# Patient Record
Sex: Female | Born: 1950 | Race: White | Hispanic: No | Marital: Single | Smoking: Former smoker
Health system: Southern US, Community
[De-identification: ages and names within clinical notes are randomized; demographics above are authoritative.]

## PROBLEM LIST (undated history)

## (undated) DIAGNOSIS — F209 Schizophrenia, unspecified: Secondary | ICD-10-CM

## (undated) DIAGNOSIS — E440 Moderate protein-calorie malnutrition: Secondary | ICD-10-CM

## (undated) DIAGNOSIS — I4892 Unspecified atrial flutter: Secondary | ICD-10-CM

## (undated) DIAGNOSIS — G4733 Obstructive sleep apnea (adult) (pediatric): Secondary | ICD-10-CM

## (undated) DIAGNOSIS — E662 Morbid (severe) obesity with alveolar hypoventilation: Secondary | ICD-10-CM

## (undated) DIAGNOSIS — G629 Polyneuropathy, unspecified: Secondary | ICD-10-CM

## (undated) DIAGNOSIS — E119 Type 2 diabetes mellitus without complications: Secondary | ICD-10-CM

## (undated) DIAGNOSIS — R413 Other amnesia: Secondary | ICD-10-CM

## (undated) DIAGNOSIS — N390 Urinary tract infection, site not specified: Secondary | ICD-10-CM

## (undated) DIAGNOSIS — M79602 Pain in left arm: Secondary | ICD-10-CM

## (undated) DIAGNOSIS — F32A Depression, unspecified: Secondary | ICD-10-CM

## (undated) DIAGNOSIS — I272 Pulmonary hypertension, unspecified: Secondary | ICD-10-CM

## (undated) DIAGNOSIS — R569 Unspecified convulsions: Secondary | ICD-10-CM

## (undated) DIAGNOSIS — I10 Essential (primary) hypertension: Secondary | ICD-10-CM

## (undated) DIAGNOSIS — F79 Unspecified intellectual disabilities: Secondary | ICD-10-CM

## (undated) DIAGNOSIS — E7800 Pure hypercholesterolemia, unspecified: Secondary | ICD-10-CM

## (undated) DIAGNOSIS — G219 Secondary parkinsonism, unspecified: Secondary | ICD-10-CM

## (undated) DIAGNOSIS — E039 Hypothyroidism, unspecified: Secondary | ICD-10-CM

## (undated) DIAGNOSIS — J189 Pneumonia, unspecified organism: Secondary | ICD-10-CM

## (undated) DIAGNOSIS — N183 Chronic kidney disease, stage 3 (moderate): Secondary | ICD-10-CM

## (undated) DIAGNOSIS — F1920 Other psychoactive substance dependence, uncomplicated: Secondary | ICD-10-CM

## (undated) DIAGNOSIS — G40909 Epilepsy, unspecified, not intractable, without status epilepticus: Secondary | ICD-10-CM

## (undated) DIAGNOSIS — E785 Hyperlipidemia, unspecified: Secondary | ICD-10-CM

## (undated) DIAGNOSIS — M069 Rheumatoid arthritis, unspecified: Secondary | ICD-10-CM

## (undated) DIAGNOSIS — F29 Unspecified psychosis not due to a substance or known physiological condition: Secondary | ICD-10-CM

## (undated) DIAGNOSIS — Z794 Long term (current) use of insulin: Secondary | ICD-10-CM

## (undated) DIAGNOSIS — J449 Chronic obstructive pulmonary disease, unspecified: Secondary | ICD-10-CM

## (undated) DIAGNOSIS — I452 Bifascicular block: Secondary | ICD-10-CM

## (undated) DIAGNOSIS — K2270 Barrett's esophagus without dysplasia: Secondary | ICD-10-CM

## (undated) DIAGNOSIS — Z8679 Personal history of other diseases of the circulatory system: Secondary | ICD-10-CM

## (undated) DIAGNOSIS — K5900 Constipation, unspecified: Secondary | ICD-10-CM

## (undated) DIAGNOSIS — Z992 Dependence on renal dialysis: Secondary | ICD-10-CM

## (undated) DIAGNOSIS — F419 Anxiety disorder, unspecified: Secondary | ICD-10-CM

## (undated) DIAGNOSIS — F4310 Post-traumatic stress disorder, unspecified: Secondary | ICD-10-CM

## (undated) DIAGNOSIS — F329 Major depressive disorder, single episode, unspecified: Secondary | ICD-10-CM

## (undated) DIAGNOSIS — R1310 Dysphagia, unspecified: Secondary | ICD-10-CM

## (undated) DIAGNOSIS — R000 Tachycardia, unspecified: Secondary | ICD-10-CM

## (undated) DIAGNOSIS — Z20822 Contact with and (suspected) exposure to covid-19: Secondary | ICD-10-CM

## (undated) DIAGNOSIS — I2699 Other pulmonary embolism without acute cor pulmonale: Secondary | ICD-10-CM

## (undated) DIAGNOSIS — I5030 Unspecified diastolic (congestive) heart failure: Secondary | ICD-10-CM

## (undated) DIAGNOSIS — Z6839 Body mass index (BMI) 39.0-39.9, adult: Secondary | ICD-10-CM

## (undated) DIAGNOSIS — N200 Calculus of kidney: Secondary | ICD-10-CM

## (undated) DIAGNOSIS — R300 Dysuria: Secondary | ICD-10-CM

## (undated) DIAGNOSIS — M549 Dorsalgia, unspecified: Secondary | ICD-10-CM

## (undated) DIAGNOSIS — G4700 Insomnia, unspecified: Secondary | ICD-10-CM

## (undated) DIAGNOSIS — I209 Angina pectoris, unspecified: Secondary | ICD-10-CM

## (undated) DIAGNOSIS — M0520 Rheumatoid vasculitis with rheumatoid arthritis of unspecified site: Secondary | ICD-10-CM

## (undated) DIAGNOSIS — M797 Fibromyalgia: Secondary | ICD-10-CM

## (undated) DIAGNOSIS — L0390 Cellulitis, unspecified: Secondary | ICD-10-CM

## (undated) DIAGNOSIS — I5032 Chronic diastolic (congestive) heart failure: Secondary | ICD-10-CM

## (undated) DIAGNOSIS — J338 Other polyp of sinus: Secondary | ICD-10-CM

## (undated) DIAGNOSIS — I509 Heart failure, unspecified: Secondary | ICD-10-CM

## (undated) DIAGNOSIS — K219 Gastro-esophageal reflux disease without esophagitis: Secondary | ICD-10-CM

## (undated) DIAGNOSIS — I4819 Other persistent atrial fibrillation: Secondary | ICD-10-CM

## (undated) DIAGNOSIS — D649 Anemia, unspecified: Secondary | ICD-10-CM

## (undated) DIAGNOSIS — IMO0002 Reserved for concepts with insufficient information to code with codable children: Secondary | ICD-10-CM

## (undated) DIAGNOSIS — N186 End stage renal disease: Secondary | ICD-10-CM

## (undated) HISTORY — DX: Chronic diastolic (congestive) heart failure: I50.32

## (undated) HISTORY — DX: Heart failure, unspecified: I50.9

## (undated) HISTORY — DX: Barrett's esophagus without dysplasia: K22.70

## (undated) HISTORY — DX: Hyperlipidemia, unspecified: E78.5

## (undated) HISTORY — PX: CHOLECYSTECTOMY: SHX55

## (undated) HISTORY — DX: Major depressive disorder, single episode, unspecified: F32.9

## (undated) HISTORY — DX: Secondary parkinsonism, unspecified: G21.9

## (undated) HISTORY — DX: Personal history of other diseases of the circulatory system: Z86.79

## (undated) HISTORY — DX: Polyneuropathy, unspecified: G62.9

## (undated) HISTORY — DX: Reserved for concepts with insufficient information to code with codable children: IMO0002

## (undated) HISTORY — DX: Calculus of kidney: N20.0

## (undated) HISTORY — DX: Essential (primary) hypertension: I10

## (undated) HISTORY — DX: Dependence on renal dialysis: N18.6

## (undated) HISTORY — DX: Gastro-esophageal reflux disease without esophagitis: K21.9

## (undated) HISTORY — DX: Type 2 diabetes mellitus without complications: E11.9

## (undated) HISTORY — DX: Unspecified diastolic (congestive) heart failure: I50.30

## (undated) HISTORY — DX: Morbid (severe) obesity with alveolar hypoventilation: E66.2

## (undated) HISTORY — DX: Other pulmonary embolism without acute cor pulmonale: I26.99

## (undated) HISTORY — DX: Schizophrenia, unspecified: F20.9

## (undated) HISTORY — DX: Anemia, unspecified: D64.9

## (undated) HISTORY — DX: Depression, unspecified: F32.A

## (undated) HISTORY — DX: Chronic kidney disease, stage 3 (moderate): N18.3

## (undated) HISTORY — PX: KIDNEY SURGERY: SHX687

## (undated) HISTORY — DX: Other psychoactive substance dependence, uncomplicated: F19.20

## (undated) HISTORY — DX: Other persistent atrial fibrillation: I48.19

## (undated) HISTORY — DX: Rheumatoid arthritis, unspecified: M06.9

## (undated) HISTORY — PX: ENDOMETRIAL BIOPSY: SHX622

## (undated) HISTORY — DX: Bifascicular block: I45.2

## (undated) HISTORY — DX: Pulmonary hypertension, unspecified: I27.20

## (undated) HISTORY — PX: APPENDECTOMY: SHX54

## (undated) HISTORY — DX: Fibromyalgia: M79.7

## (undated) HISTORY — DX: Dorsalgia, unspecified: M54.9

## (undated) HISTORY — DX: Unspecified atrial flutter: I48.92

## (undated) HISTORY — PX: COLONOSCOPY: SHX174

## (undated) HISTORY — DX: End stage renal disease: Z99.2

## (undated) HISTORY — DX: Anxiety disorder, unspecified: F41.9

---

## 2001-08-23 ENCOUNTER — Encounter: Admission: RE | Admit: 2001-08-23 | Discharge: 2001-08-23 | Payer: Self-pay | Admitting: Family Medicine

## 2001-09-25 ENCOUNTER — Encounter: Admission: RE | Admit: 2001-09-25 | Discharge: 2001-09-25 | Payer: Self-pay | Admitting: Family Medicine

## 2001-10-03 ENCOUNTER — Encounter: Admission: RE | Admit: 2001-10-03 | Discharge: 2001-10-03 | Payer: Self-pay | Admitting: Family Medicine

## 2001-10-30 ENCOUNTER — Encounter: Admission: RE | Admit: 2001-10-30 | Discharge: 2001-10-30 | Payer: Self-pay | Admitting: Family Medicine

## 2001-11-03 ENCOUNTER — Encounter: Admission: RE | Admit: 2001-11-03 | Discharge: 2001-11-03 | Payer: Self-pay | Admitting: Family Medicine

## 2001-11-13 ENCOUNTER — Encounter: Admission: RE | Admit: 2001-11-13 | Discharge: 2001-11-13 | Payer: Self-pay | Admitting: Family Medicine

## 2001-11-27 ENCOUNTER — Observation Stay (HOSPITAL_COMMUNITY): Admission: EM | Admit: 2001-11-27 | Discharge: 2001-11-28 | Payer: Self-pay

## 2001-11-27 ENCOUNTER — Encounter: Payer: Self-pay | Admitting: Emergency Medicine

## 2001-12-04 ENCOUNTER — Encounter: Admission: RE | Admit: 2001-12-04 | Discharge: 2001-12-04 | Payer: Self-pay | Admitting: Family Medicine

## 2001-12-07 ENCOUNTER — Encounter: Admission: RE | Admit: 2001-12-07 | Discharge: 2001-12-07 | Payer: Self-pay | Admitting: Sports Medicine

## 2001-12-21 ENCOUNTER — Encounter (INDEPENDENT_AMBULATORY_CARE_PROVIDER_SITE_OTHER): Payer: Self-pay | Admitting: Specialist

## 2001-12-21 ENCOUNTER — Ambulatory Visit (HOSPITAL_COMMUNITY): Admission: RE | Admit: 2001-12-21 | Discharge: 2001-12-21 | Payer: Self-pay | Admitting: *Deleted

## 2002-01-08 ENCOUNTER — Encounter: Admission: RE | Admit: 2002-01-08 | Discharge: 2002-01-08 | Payer: Self-pay | Admitting: Sports Medicine

## 2002-04-04 ENCOUNTER — Emergency Department (HOSPITAL_COMMUNITY): Admission: EM | Admit: 2002-04-04 | Discharge: 2002-04-04 | Payer: Self-pay | Admitting: Emergency Medicine

## 2002-04-04 ENCOUNTER — Encounter: Payer: Self-pay | Admitting: Emergency Medicine

## 2002-04-19 ENCOUNTER — Encounter: Payer: Self-pay | Admitting: Internal Medicine

## 2002-04-19 ENCOUNTER — Encounter: Admission: RE | Admit: 2002-04-19 | Discharge: 2002-04-19 | Payer: Self-pay | Admitting: Family Medicine

## 2002-04-19 ENCOUNTER — Encounter: Admission: RE | Admit: 2002-04-19 | Discharge: 2002-04-19 | Payer: Self-pay | Admitting: Internal Medicine

## 2002-04-20 ENCOUNTER — Encounter: Payer: Self-pay | Admitting: Sports Medicine

## 2002-04-20 ENCOUNTER — Encounter: Admission: RE | Admit: 2002-04-20 | Discharge: 2002-04-20 | Payer: Self-pay | Admitting: Sports Medicine

## 2002-05-21 ENCOUNTER — Encounter: Admission: RE | Admit: 2002-05-21 | Discharge: 2002-05-21 | Payer: Self-pay | Admitting: Sports Medicine

## 2002-05-31 ENCOUNTER — Encounter: Payer: Self-pay | Admitting: Sports Medicine

## 2002-05-31 ENCOUNTER — Encounter: Admission: RE | Admit: 2002-05-31 | Discharge: 2002-05-31 | Payer: Self-pay | Admitting: Sports Medicine

## 2002-06-11 ENCOUNTER — Encounter: Admission: RE | Admit: 2002-06-11 | Discharge: 2002-06-11 | Payer: Self-pay | Admitting: Family Medicine

## 2002-06-11 ENCOUNTER — Other Ambulatory Visit: Admission: RE | Admit: 2002-06-11 | Discharge: 2002-06-11 | Payer: Self-pay | Admitting: Family Medicine

## 2002-11-06 ENCOUNTER — Encounter: Admission: RE | Admit: 2002-11-06 | Discharge: 2002-11-06 | Payer: Self-pay | Admitting: Family Medicine

## 2002-12-04 ENCOUNTER — Ambulatory Visit (HOSPITAL_COMMUNITY): Admission: RE | Admit: 2002-12-04 | Discharge: 2002-12-04 | Payer: Self-pay | Admitting: *Deleted

## 2002-12-14 ENCOUNTER — Encounter: Admission: RE | Admit: 2002-12-14 | Discharge: 2002-12-14 | Payer: Self-pay | Admitting: Family Medicine

## 2002-12-18 ENCOUNTER — Encounter: Admission: RE | Admit: 2002-12-18 | Discharge: 2002-12-18 | Payer: Self-pay | Admitting: Surgery

## 2002-12-18 ENCOUNTER — Encounter: Payer: Self-pay | Admitting: Surgery

## 2003-01-11 ENCOUNTER — Encounter: Admission: RE | Admit: 2003-01-11 | Discharge: 2003-01-11 | Payer: Self-pay | Admitting: Family Medicine

## 2003-02-26 ENCOUNTER — Encounter: Admission: RE | Admit: 2003-02-26 | Discharge: 2003-02-26 | Payer: Self-pay | Admitting: Family Medicine

## 2003-05-02 ENCOUNTER — Encounter: Admission: RE | Admit: 2003-05-02 | Discharge: 2003-05-02 | Payer: Self-pay | Admitting: Family Medicine

## 2003-05-16 ENCOUNTER — Encounter: Admission: RE | Admit: 2003-05-16 | Discharge: 2003-05-16 | Payer: Self-pay | Admitting: Sports Medicine

## 2003-05-23 ENCOUNTER — Encounter: Admission: RE | Admit: 2003-05-23 | Discharge: 2003-05-23 | Payer: Self-pay | Admitting: Family Medicine

## 2003-06-03 ENCOUNTER — Encounter: Admission: RE | Admit: 2003-06-03 | Discharge: 2003-06-03 | Payer: Self-pay | Admitting: Sports Medicine

## 2003-06-03 ENCOUNTER — Encounter: Payer: Self-pay | Admitting: Sports Medicine

## 2003-06-03 ENCOUNTER — Encounter: Admission: RE | Admit: 2003-06-03 | Discharge: 2003-06-03 | Payer: Self-pay | Admitting: Family Medicine

## 2003-06-05 ENCOUNTER — Encounter: Admission: RE | Admit: 2003-06-05 | Discharge: 2003-06-05 | Payer: Self-pay | Admitting: Family Medicine

## 2003-06-06 ENCOUNTER — Encounter: Admission: RE | Admit: 2003-06-06 | Discharge: 2003-06-06 | Payer: Self-pay | Admitting: Sports Medicine

## 2003-06-07 ENCOUNTER — Encounter: Admission: RE | Admit: 2003-06-07 | Discharge: 2003-06-07 | Payer: Self-pay | Admitting: Sports Medicine

## 2003-06-10 ENCOUNTER — Encounter: Admission: RE | Admit: 2003-06-10 | Discharge: 2003-06-10 | Payer: Self-pay | Admitting: Family Medicine

## 2003-06-17 ENCOUNTER — Encounter: Admission: RE | Admit: 2003-06-17 | Discharge: 2003-06-17 | Payer: Self-pay | Admitting: Family Medicine

## 2003-06-17 ENCOUNTER — Other Ambulatory Visit: Admission: RE | Admit: 2003-06-17 | Discharge: 2003-06-17 | Payer: Self-pay | Admitting: Family Medicine

## 2003-06-26 ENCOUNTER — Encounter: Admission: RE | Admit: 2003-06-26 | Discharge: 2003-06-26 | Payer: Self-pay | Admitting: Sports Medicine

## 2003-06-27 ENCOUNTER — Encounter: Admission: RE | Admit: 2003-06-27 | Discharge: 2003-06-27 | Payer: Self-pay | Admitting: Sports Medicine

## 2003-07-01 ENCOUNTER — Encounter: Admission: RE | Admit: 2003-07-01 | Discharge: 2003-07-01 | Payer: Self-pay | Admitting: Family Medicine

## 2003-07-08 ENCOUNTER — Encounter: Admission: RE | Admit: 2003-07-08 | Discharge: 2003-07-08 | Payer: Self-pay | Admitting: Family Medicine

## 2003-07-31 ENCOUNTER — Encounter: Admission: RE | Admit: 2003-07-31 | Discharge: 2003-07-31 | Payer: Self-pay | Admitting: Sports Medicine

## 2003-08-12 ENCOUNTER — Encounter: Admission: RE | Admit: 2003-08-12 | Discharge: 2003-08-12 | Payer: Self-pay | Admitting: Family Medicine

## 2003-08-19 ENCOUNTER — Encounter: Admission: RE | Admit: 2003-08-19 | Discharge: 2003-08-19 | Payer: Self-pay | Admitting: Family Medicine

## 2003-08-28 ENCOUNTER — Encounter: Admission: RE | Admit: 2003-08-28 | Discharge: 2003-08-28 | Payer: Self-pay | Admitting: Family Medicine

## 2003-09-05 ENCOUNTER — Encounter: Admission: RE | Admit: 2003-09-05 | Discharge: 2003-09-05 | Payer: Self-pay | Admitting: Family Medicine

## 2003-09-13 ENCOUNTER — Encounter: Admission: RE | Admit: 2003-09-13 | Discharge: 2003-09-13 | Payer: Self-pay | Admitting: Sports Medicine

## 2003-09-30 ENCOUNTER — Encounter: Admission: RE | Admit: 2003-09-30 | Discharge: 2003-09-30 | Payer: Self-pay | Admitting: Family Medicine

## 2003-10-16 ENCOUNTER — Encounter: Admission: RE | Admit: 2003-10-16 | Discharge: 2003-10-16 | Payer: Self-pay | Admitting: Family Medicine

## 2003-10-29 ENCOUNTER — Encounter: Admission: RE | Admit: 2003-10-29 | Discharge: 2003-10-29 | Payer: Self-pay | Admitting: Family Medicine

## 2003-11-21 ENCOUNTER — Encounter: Admission: RE | Admit: 2003-11-21 | Discharge: 2003-11-21 | Payer: Self-pay | Admitting: Family Medicine

## 2003-12-25 ENCOUNTER — Encounter: Payer: Self-pay | Admitting: Cardiology

## 2003-12-25 ENCOUNTER — Ambulatory Visit (HOSPITAL_COMMUNITY): Admission: RE | Admit: 2003-12-25 | Discharge: 2003-12-25 | Payer: Self-pay | Admitting: Vascular Surgery

## 2004-01-24 ENCOUNTER — Encounter: Admission: RE | Admit: 2004-01-24 | Discharge: 2004-01-24 | Payer: Self-pay | Admitting: Sports Medicine

## 2004-02-27 ENCOUNTER — Emergency Department (HOSPITAL_COMMUNITY): Admission: EM | Admit: 2004-02-27 | Discharge: 2004-02-27 | Payer: Self-pay | Admitting: *Deleted

## 2004-04-13 ENCOUNTER — Encounter: Admission: RE | Admit: 2004-04-13 | Discharge: 2004-04-13 | Payer: Self-pay | Admitting: Sports Medicine

## 2004-05-13 ENCOUNTER — Ambulatory Visit: Payer: Self-pay | Admitting: Family Medicine

## 2004-06-09 ENCOUNTER — Ambulatory Visit: Payer: Self-pay | Admitting: Sports Medicine

## 2004-07-06 ENCOUNTER — Ambulatory Visit: Payer: Self-pay | Admitting: Family Medicine

## 2004-07-20 ENCOUNTER — Ambulatory Visit: Payer: Self-pay | Admitting: Sports Medicine

## 2004-07-29 ENCOUNTER — Ambulatory Visit: Payer: Self-pay | Admitting: Family Medicine

## 2004-08-13 ENCOUNTER — Ambulatory Visit: Payer: Self-pay | Admitting: Family Medicine

## 2004-08-31 ENCOUNTER — Encounter: Admission: RE | Admit: 2004-08-31 | Discharge: 2004-08-31 | Payer: Self-pay | Admitting: Otolaryngology

## 2004-09-16 ENCOUNTER — Encounter (INDEPENDENT_AMBULATORY_CARE_PROVIDER_SITE_OTHER): Payer: Self-pay | Admitting: Specialist

## 2004-09-16 ENCOUNTER — Ambulatory Visit (HOSPITAL_COMMUNITY): Admission: RE | Admit: 2004-09-16 | Discharge: 2004-09-17 | Payer: Self-pay | Admitting: Otolaryngology

## 2004-10-23 ENCOUNTER — Ambulatory Visit: Payer: Self-pay | Admitting: Family Medicine

## 2004-11-06 ENCOUNTER — Ambulatory Visit: Payer: Self-pay | Admitting: Family Medicine

## 2005-02-27 IMAGING — CR DG CHEST 2V
2 series · 2 of 2 positions shown · non-contrast
Comparison: none

CLINICAL DATA: Hypertension.
 PA AND LATERAL CHEST:
 The lungs are clear. The cardiac and mediastinal contours are normal.  Osseous structures are unremarkable.  Mild degenerative changes are seen within the mid thoracic spine.

[view not recorded (1 of 2)]
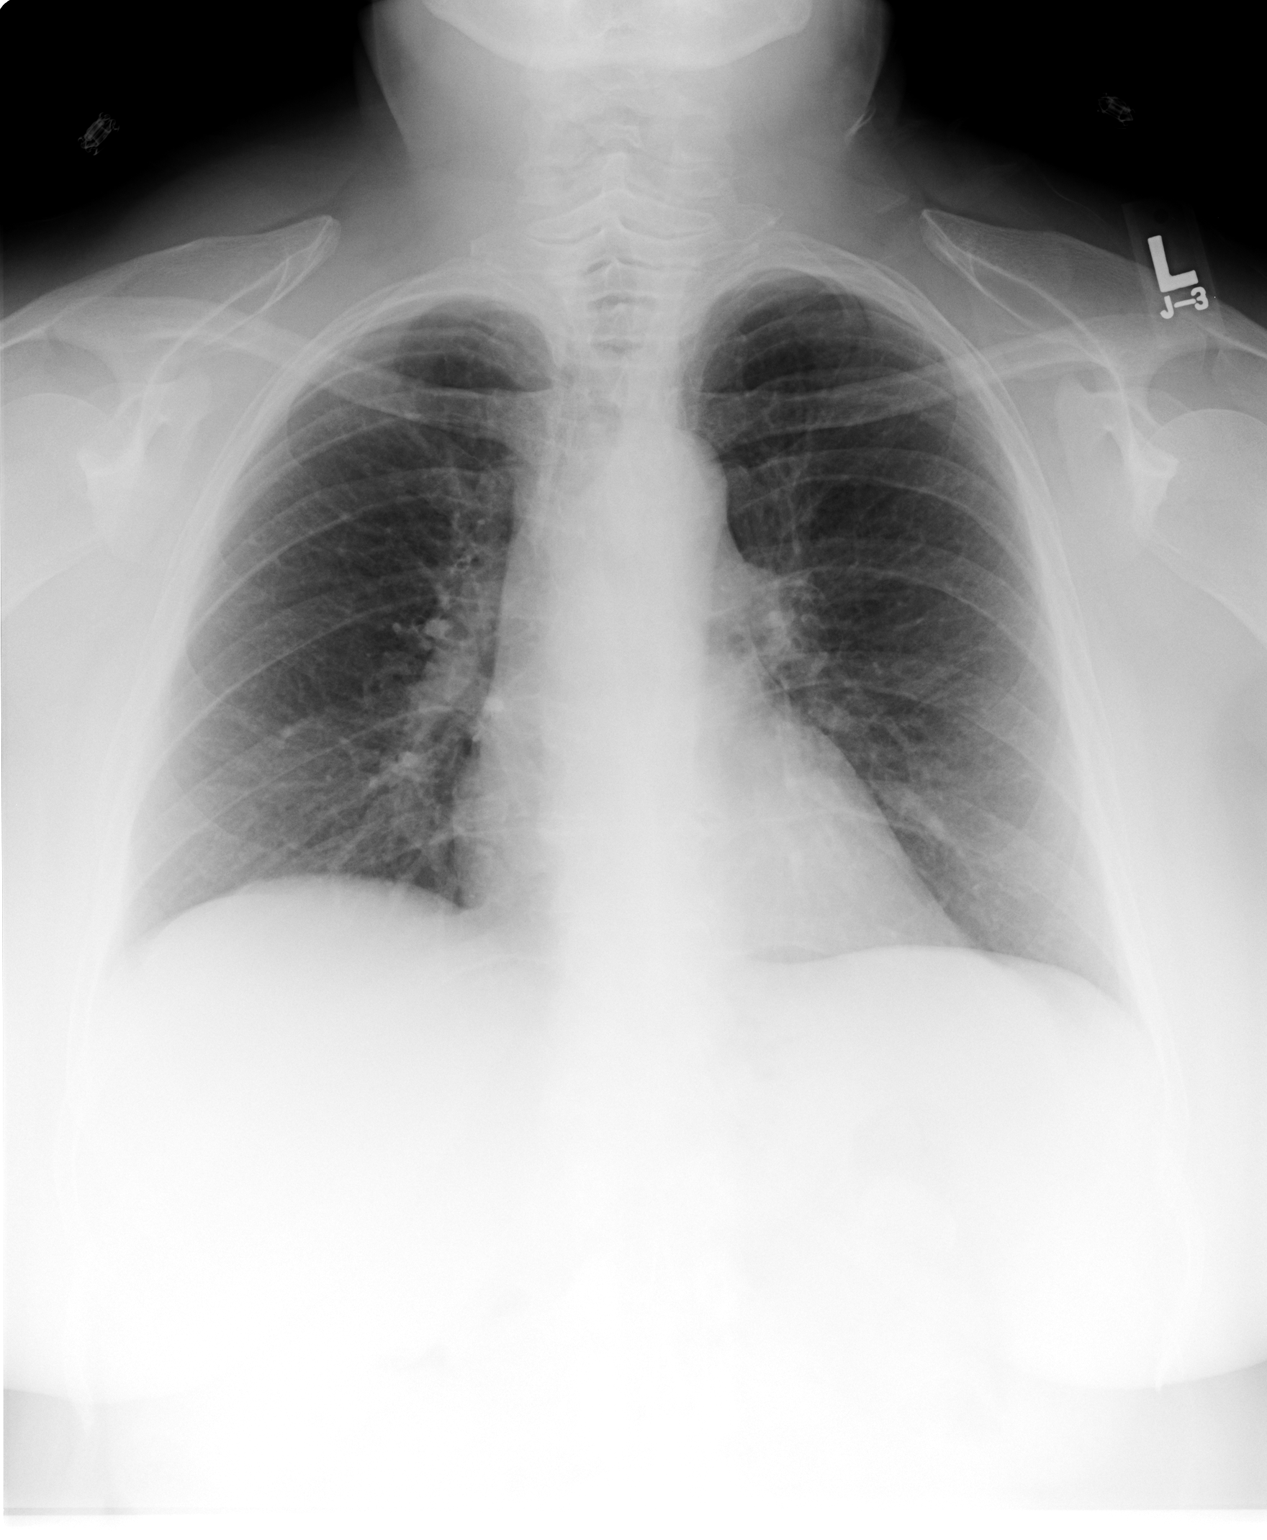

[view not recorded (2 of 2)]
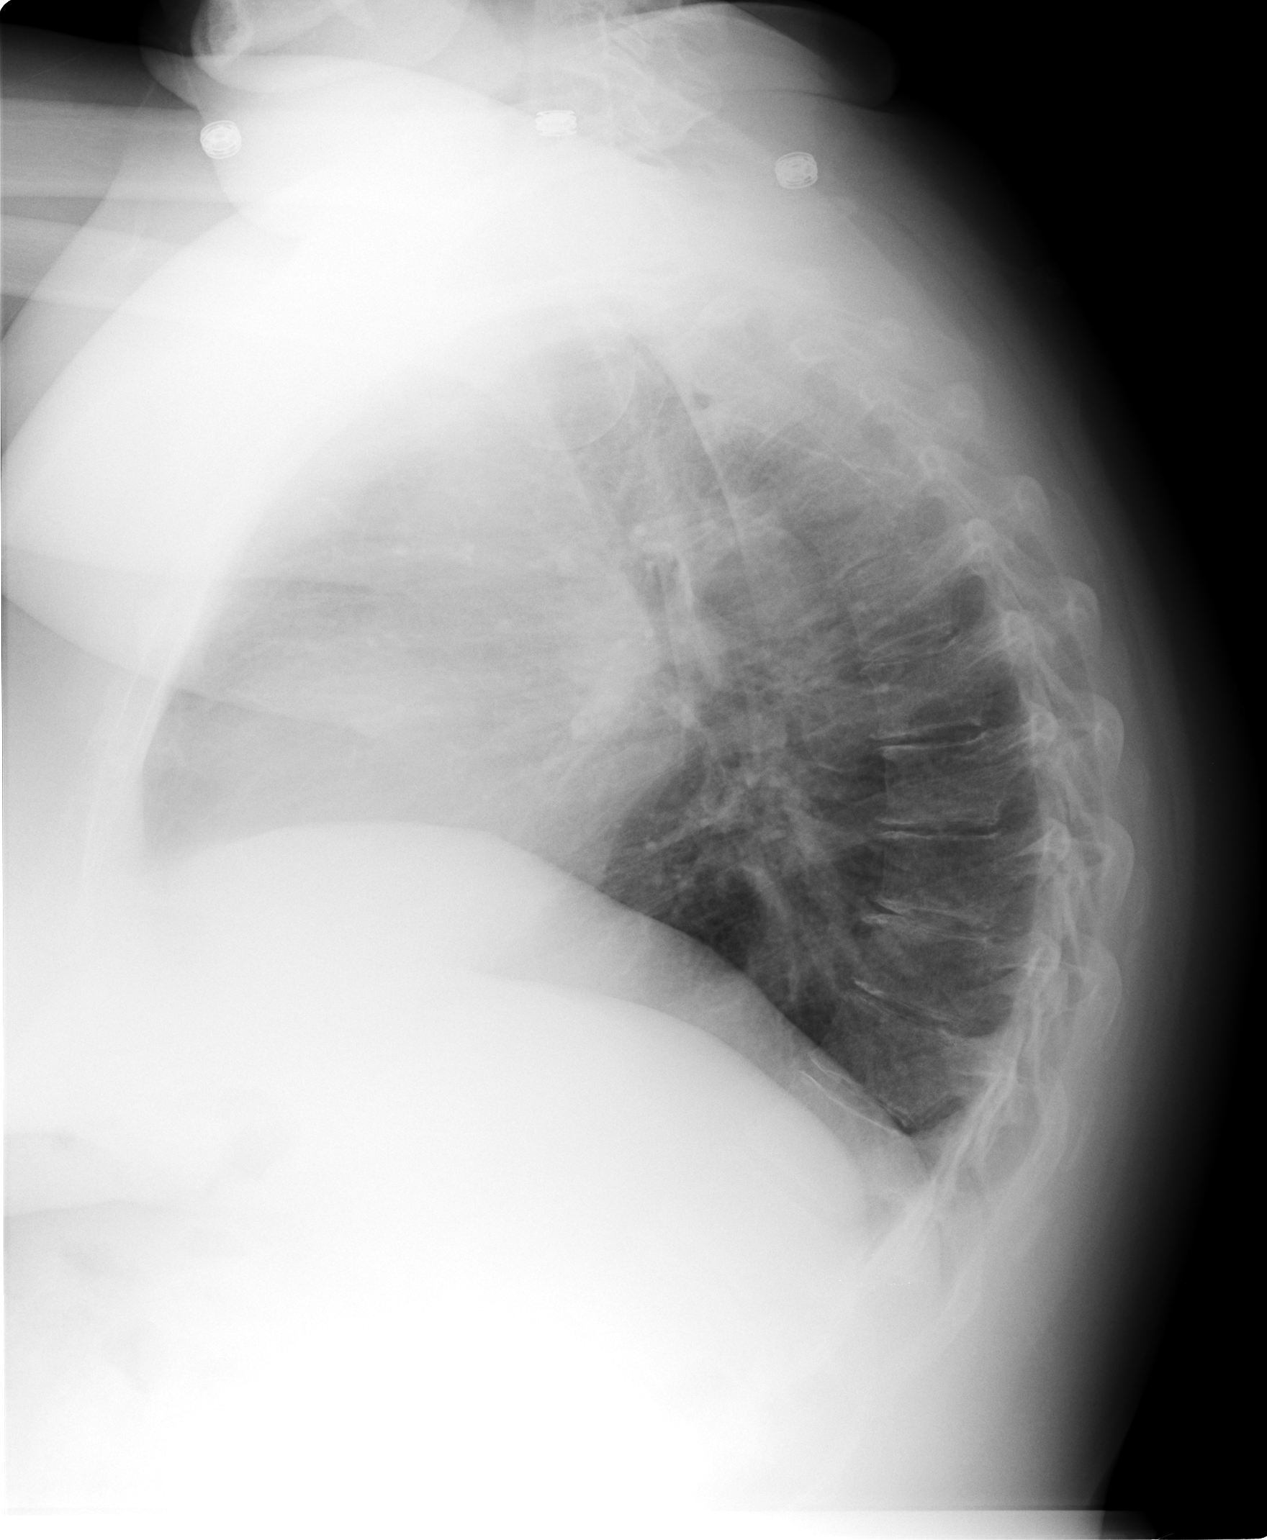

[2 of 2 positions shown; findings below may reference images not displayed]

IMPRESSION: Negative for acute cardiac or pulmonary process.

## 2005-03-30 ENCOUNTER — Ambulatory Visit: Payer: Self-pay | Admitting: Family Medicine

## 2005-04-01 ENCOUNTER — Encounter: Admission: RE | Admit: 2005-04-01 | Discharge: 2005-04-01 | Payer: Self-pay | Admitting: Sports Medicine

## 2005-04-14 ENCOUNTER — Ambulatory Visit: Payer: Self-pay | Admitting: Family Medicine

## 2005-04-14 ENCOUNTER — Ambulatory Visit (HOSPITAL_COMMUNITY): Admission: RE | Admit: 2005-04-14 | Discharge: 2005-04-14 | Payer: Self-pay | Admitting: Family Medicine

## 2005-04-21 ENCOUNTER — Ambulatory Visit: Payer: Self-pay | Admitting: Family Medicine

## 2005-05-13 ENCOUNTER — Ambulatory Visit: Payer: Self-pay | Admitting: Family Medicine

## 2005-06-01 ENCOUNTER — Ambulatory Visit: Payer: Self-pay | Admitting: Family Medicine

## 2005-06-03 ENCOUNTER — Encounter: Admission: RE | Admit: 2005-06-03 | Discharge: 2005-06-03 | Payer: Self-pay | Admitting: Sports Medicine

## 2005-06-17 ENCOUNTER — Ambulatory Visit: Payer: Self-pay | Admitting: Family Medicine

## 2005-07-01 ENCOUNTER — Ambulatory Visit: Payer: Self-pay | Admitting: Family Medicine

## 2005-07-07 ENCOUNTER — Ambulatory Visit: Payer: Self-pay | Admitting: Family Medicine

## 2005-07-28 ENCOUNTER — Ambulatory Visit: Payer: Self-pay | Admitting: Family Medicine

## 2005-08-31 ENCOUNTER — Ambulatory Visit: Payer: Self-pay | Admitting: Family Medicine

## 2005-09-14 IMAGING — CR DG HAND COMPLETE 3+V*R*
3 series · 3 of 3 positions shown · non-contrast
Comparison: none

CLINICAL DATA: Nodules, swelling. 
RIGHT HAND THREE VIEWS:
There is marked first CMC osteoarthritis.    Erosions are seen about the PIP joint of the little finger.  There is also soft tissue swelling with erosions seen at the DIP of the long finger.  Finally, there are likely some erosions of the PIP joint of the index finger of the right hand.    There is capsular distention at the DIP joint of the long finger, PIP joint of the ring finger, and PIP joint of the little finger.  There 5th MCP joint appears narrowed.   is seen in the ulnar styloid with overlying soft tissue swelling.  
LEFT HAND THREE VIEWS:
Patient has marked first CMC osteoarthritis.  There is capsular distention at the PIP joint of the long finger with an erosion seen at the base of the middle phalanx of the long finger.  MCP joint spaces and PIP joints appear narrowed.  This appears more marked than on the right.  There is some osteophytosis at the PIP joint of the index, middle, and ring fingers.

[view not recorded (1 of 3)]
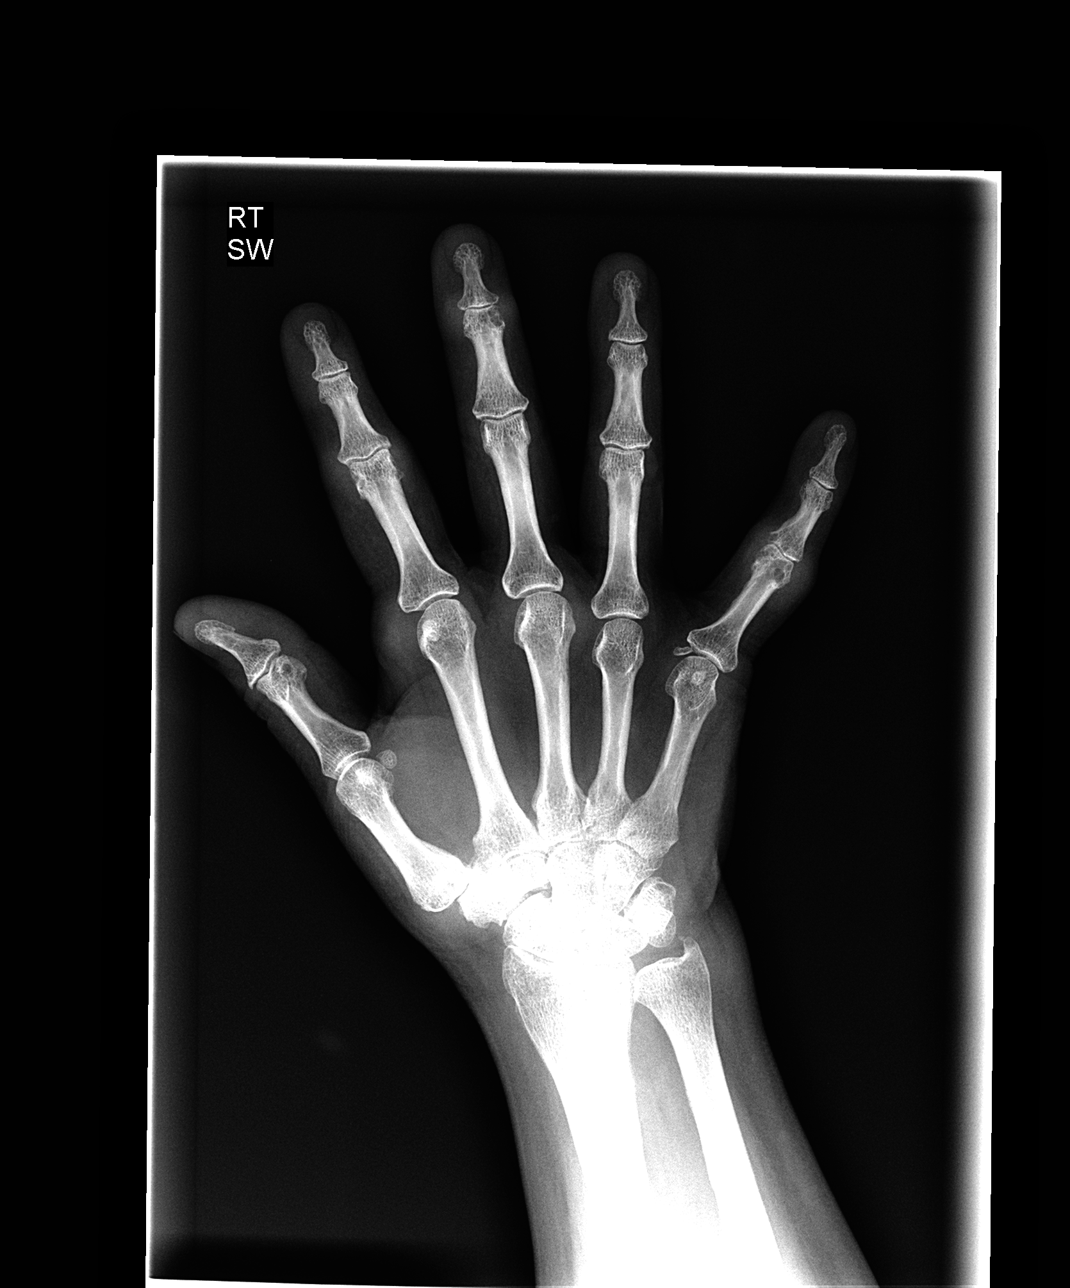

[view not recorded (2 of 3)]
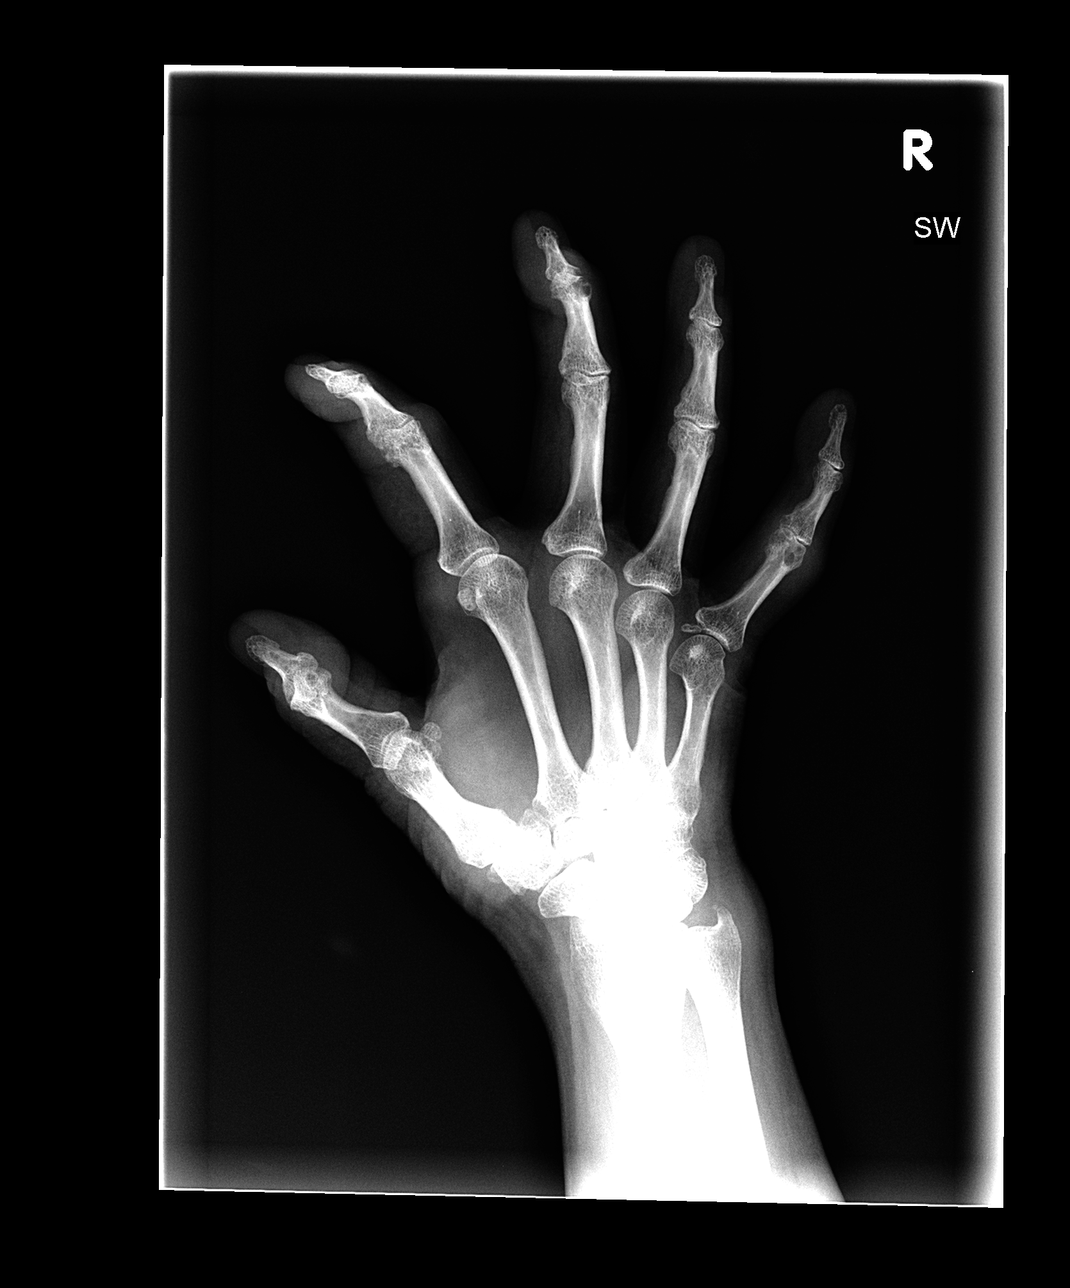

[view not recorded (3 of 3)]
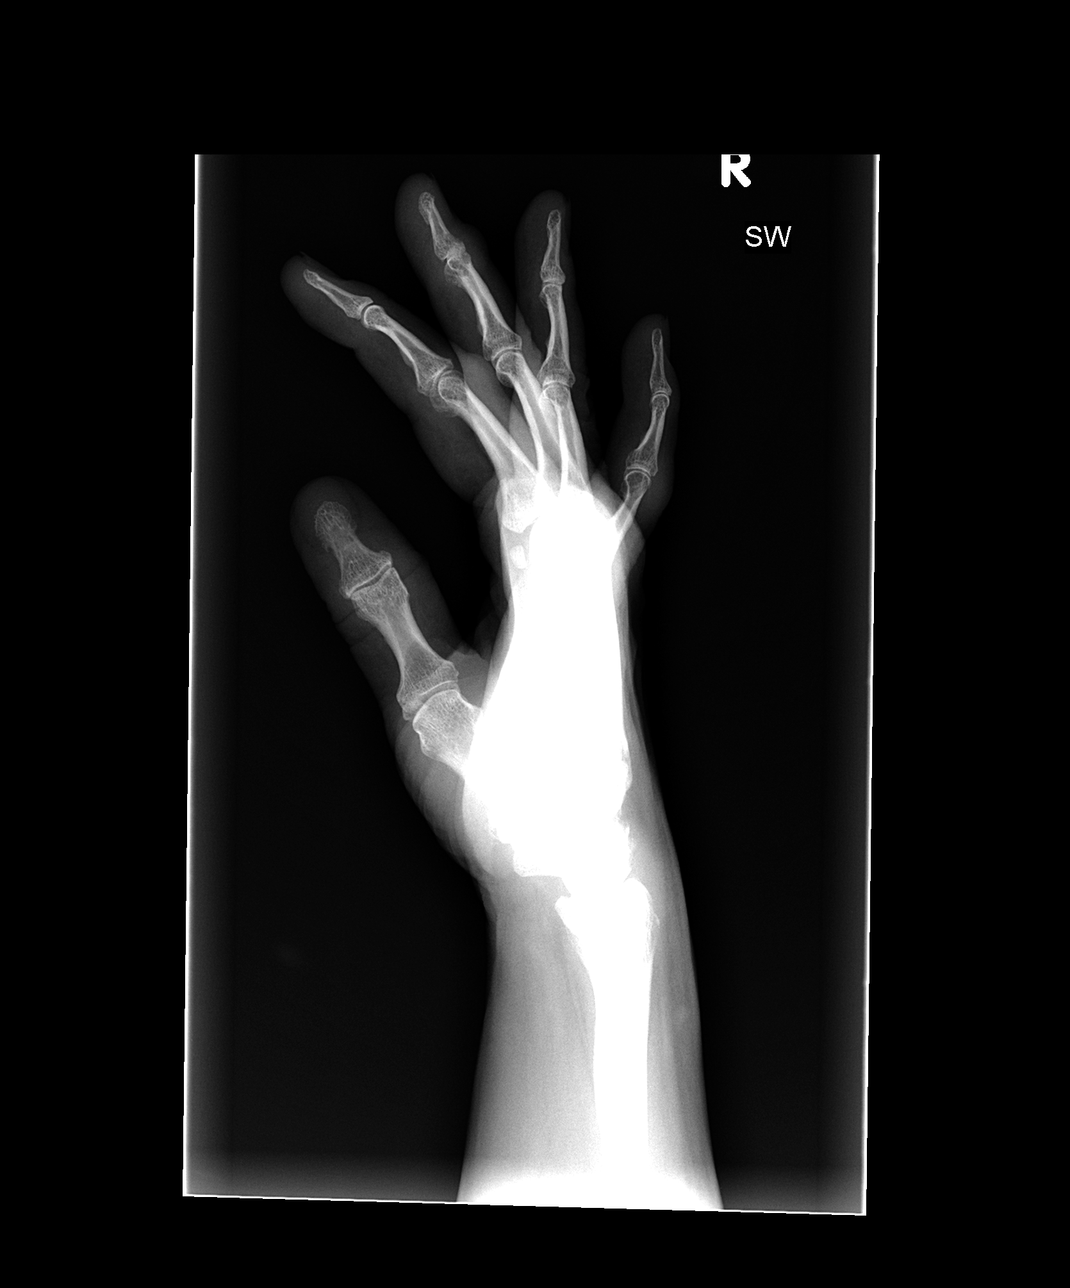

[3 of 3 positions shown; findings below may reference images not displayed]

IMPRESSION: 1.  Bilateral first CMC osteoarthritis. 
2.  Somewhat mixed picture, with overall findings suggestive of inflammatory or [HOSPITAL] induced arthropathy.  [HOSPITAL] arthropathy such as gout is favored given the asymmetric distribution of erosive change and preserved bone mineralization.  Atypical presentation of inflammatory arthropathy such as rheumatoid is within the differential.

## 2005-09-24 ENCOUNTER — Ambulatory Visit: Payer: Self-pay | Admitting: Family Medicine

## 2005-10-25 ENCOUNTER — Ambulatory Visit: Payer: Self-pay | Admitting: Family Medicine

## 2005-10-25 ENCOUNTER — Ambulatory Visit (HOSPITAL_COMMUNITY): Admission: RE | Admit: 2005-10-25 | Discharge: 2005-10-25 | Payer: Self-pay | Admitting: Sports Medicine

## 2006-01-24 ENCOUNTER — Ambulatory Visit: Payer: Self-pay | Admitting: Family Medicine

## 2006-02-09 ENCOUNTER — Ambulatory Visit: Payer: Self-pay | Admitting: Family Medicine

## 2006-03-08 ENCOUNTER — Ambulatory Visit: Payer: Self-pay | Admitting: Sports Medicine

## 2006-03-24 ENCOUNTER — Ambulatory Visit (HOSPITAL_COMMUNITY): Admission: RE | Admit: 2006-03-24 | Discharge: 2006-03-24 | Payer: Self-pay | Admitting: Gastroenterology

## 2006-03-24 ENCOUNTER — Encounter (INDEPENDENT_AMBULATORY_CARE_PROVIDER_SITE_OTHER): Payer: Self-pay | Admitting: Specialist

## 2006-05-13 ENCOUNTER — Ambulatory Visit (HOSPITAL_COMMUNITY): Admission: RE | Admit: 2006-05-13 | Discharge: 2006-05-13 | Payer: Self-pay | Admitting: Family Medicine

## 2006-05-13 ENCOUNTER — Ambulatory Visit: Payer: Self-pay | Admitting: Family Medicine

## 2006-05-25 ENCOUNTER — Encounter (HOSPITAL_COMMUNITY): Admission: RE | Admit: 2006-05-25 | Discharge: 2006-08-23 | Payer: Self-pay | Admitting: Cardiovascular Disease

## 2006-06-14 ENCOUNTER — Ambulatory Visit: Payer: Self-pay | Admitting: Family Medicine

## 2006-06-26 ENCOUNTER — Ambulatory Visit: Payer: Self-pay | Admitting: Family Medicine

## 2006-06-26 ENCOUNTER — Inpatient Hospital Stay (HOSPITAL_COMMUNITY): Admission: EM | Admit: 2006-06-26 | Discharge: 2006-06-28 | Payer: Self-pay | Admitting: Emergency Medicine

## 2006-07-05 ENCOUNTER — Ambulatory Visit: Payer: Self-pay | Admitting: Family Medicine

## 2006-07-12 ENCOUNTER — Ambulatory Visit: Payer: Self-pay | Admitting: Family Medicine

## 2006-08-02 ENCOUNTER — Ambulatory Visit: Payer: Self-pay | Admitting: Family Medicine

## 2006-08-16 ENCOUNTER — Encounter (INDEPENDENT_AMBULATORY_CARE_PROVIDER_SITE_OTHER): Payer: Self-pay | Admitting: *Deleted

## 2006-08-16 LAB — CONVERTED CEMR LAB

## 2006-09-19 ENCOUNTER — Encounter (INDEPENDENT_AMBULATORY_CARE_PROVIDER_SITE_OTHER): Payer: Self-pay | Admitting: Family Medicine

## 2006-09-19 ENCOUNTER — Ambulatory Visit: Payer: Self-pay | Admitting: Sports Medicine

## 2006-09-19 ENCOUNTER — Other Ambulatory Visit: Admission: RE | Admit: 2006-09-19 | Discharge: 2006-09-19 | Payer: Self-pay | Admitting: Family Medicine

## 2006-10-03 ENCOUNTER — Encounter (INDEPENDENT_AMBULATORY_CARE_PROVIDER_SITE_OTHER): Payer: Self-pay | Admitting: Family Medicine

## 2006-10-05 ENCOUNTER — Ambulatory Visit: Payer: Self-pay | Admitting: Family Medicine

## 2006-10-13 DIAGNOSIS — M1990 Unspecified osteoarthritis, unspecified site: Secondary | ICD-10-CM | POA: Insufficient documentation

## 2006-10-13 DIAGNOSIS — I872 Venous insufficiency (chronic) (peripheral): Secondary | ICD-10-CM | POA: Insufficient documentation

## 2006-10-13 DIAGNOSIS — F32A Depression, unspecified: Secondary | ICD-10-CM | POA: Insufficient documentation

## 2006-10-13 DIAGNOSIS — K2100 Gastro-esophageal reflux disease with esophagitis, without bleeding: Secondary | ICD-10-CM | POA: Insufficient documentation

## 2006-10-13 DIAGNOSIS — N3941 Urge incontinence: Secondary | ICD-10-CM | POA: Insufficient documentation

## 2006-10-13 DIAGNOSIS — J309 Allergic rhinitis, unspecified: Secondary | ICD-10-CM | POA: Insufficient documentation

## 2006-10-13 DIAGNOSIS — F209 Schizophrenia, unspecified: Secondary | ICD-10-CM | POA: Insufficient documentation

## 2006-10-13 DIAGNOSIS — M109 Gout, unspecified: Secondary | ICD-10-CM | POA: Insufficient documentation

## 2006-10-13 DIAGNOSIS — E7800 Pure hypercholesterolemia, unspecified: Secondary | ICD-10-CM | POA: Insufficient documentation

## 2006-10-13 DIAGNOSIS — F329 Major depressive disorder, single episode, unspecified: Secondary | ICD-10-CM

## 2006-10-13 DIAGNOSIS — I10 Essential (primary) hypertension: Secondary | ICD-10-CM | POA: Insufficient documentation

## 2006-10-13 DIAGNOSIS — E876 Hypokalemia: Secondary | ICD-10-CM | POA: Insufficient documentation

## 2006-10-14 ENCOUNTER — Ambulatory Visit (HOSPITAL_COMMUNITY): Admission: RE | Admit: 2006-10-14 | Discharge: 2006-10-14 | Payer: Self-pay | Admitting: Sports Medicine

## 2006-10-14 ENCOUNTER — Encounter (INDEPENDENT_AMBULATORY_CARE_PROVIDER_SITE_OTHER): Payer: Self-pay | Admitting: *Deleted

## 2006-10-24 ENCOUNTER — Encounter (INDEPENDENT_AMBULATORY_CARE_PROVIDER_SITE_OTHER): Payer: Self-pay | Admitting: Family Medicine

## 2006-11-14 ENCOUNTER — Encounter (INDEPENDENT_AMBULATORY_CARE_PROVIDER_SITE_OTHER): Payer: Self-pay | Admitting: Family Medicine

## 2006-12-01 ENCOUNTER — Encounter (INDEPENDENT_AMBULATORY_CARE_PROVIDER_SITE_OTHER): Payer: Self-pay | Admitting: Family Medicine

## 2006-12-01 ENCOUNTER — Ambulatory Visit: Payer: Self-pay | Admitting: Family Medicine

## 2006-12-01 LAB — CONVERTED CEMR LAB
BUN: 25 mg/dL — ABNORMAL HIGH (ref 6–23)
CO2: 25 meq/L (ref 19–32)
Calcium: 9.5 mg/dL (ref 8.4–10.5)
Chloride: 95 meq/L — ABNORMAL LOW (ref 96–112)
Creatinine, Ser: 1.02 mg/dL (ref 0.40–1.20)
Glucose, Bld: 339 mg/dL — ABNORMAL HIGH (ref 70–99)
Hgb A1c MFr Bld: 8.8 %
Potassium: 3.4 meq/L — ABNORMAL LOW (ref 3.5–5.3)
Sodium: 139 meq/L (ref 135–145)

## 2006-12-07 ENCOUNTER — Encounter (INDEPENDENT_AMBULATORY_CARE_PROVIDER_SITE_OTHER): Payer: Self-pay | Admitting: Family Medicine

## 2006-12-08 IMAGING — CR DG CHEST 1V PORT
1 series · 1 of 1 positions shown · non-contrast
Comparison: none

CLINICAL DATA: Shortness of breath

Portable chest at 5853:
Comparison 09/14/2004. Low lung volumes with resultant crowding of
bronchovascular structures. Heart size appears mildly enlarged, probably
emphasized by the low volumes and portable technique. No effusion. No
infiltrate. Visualized bones unremarkable.

[view not recorded]
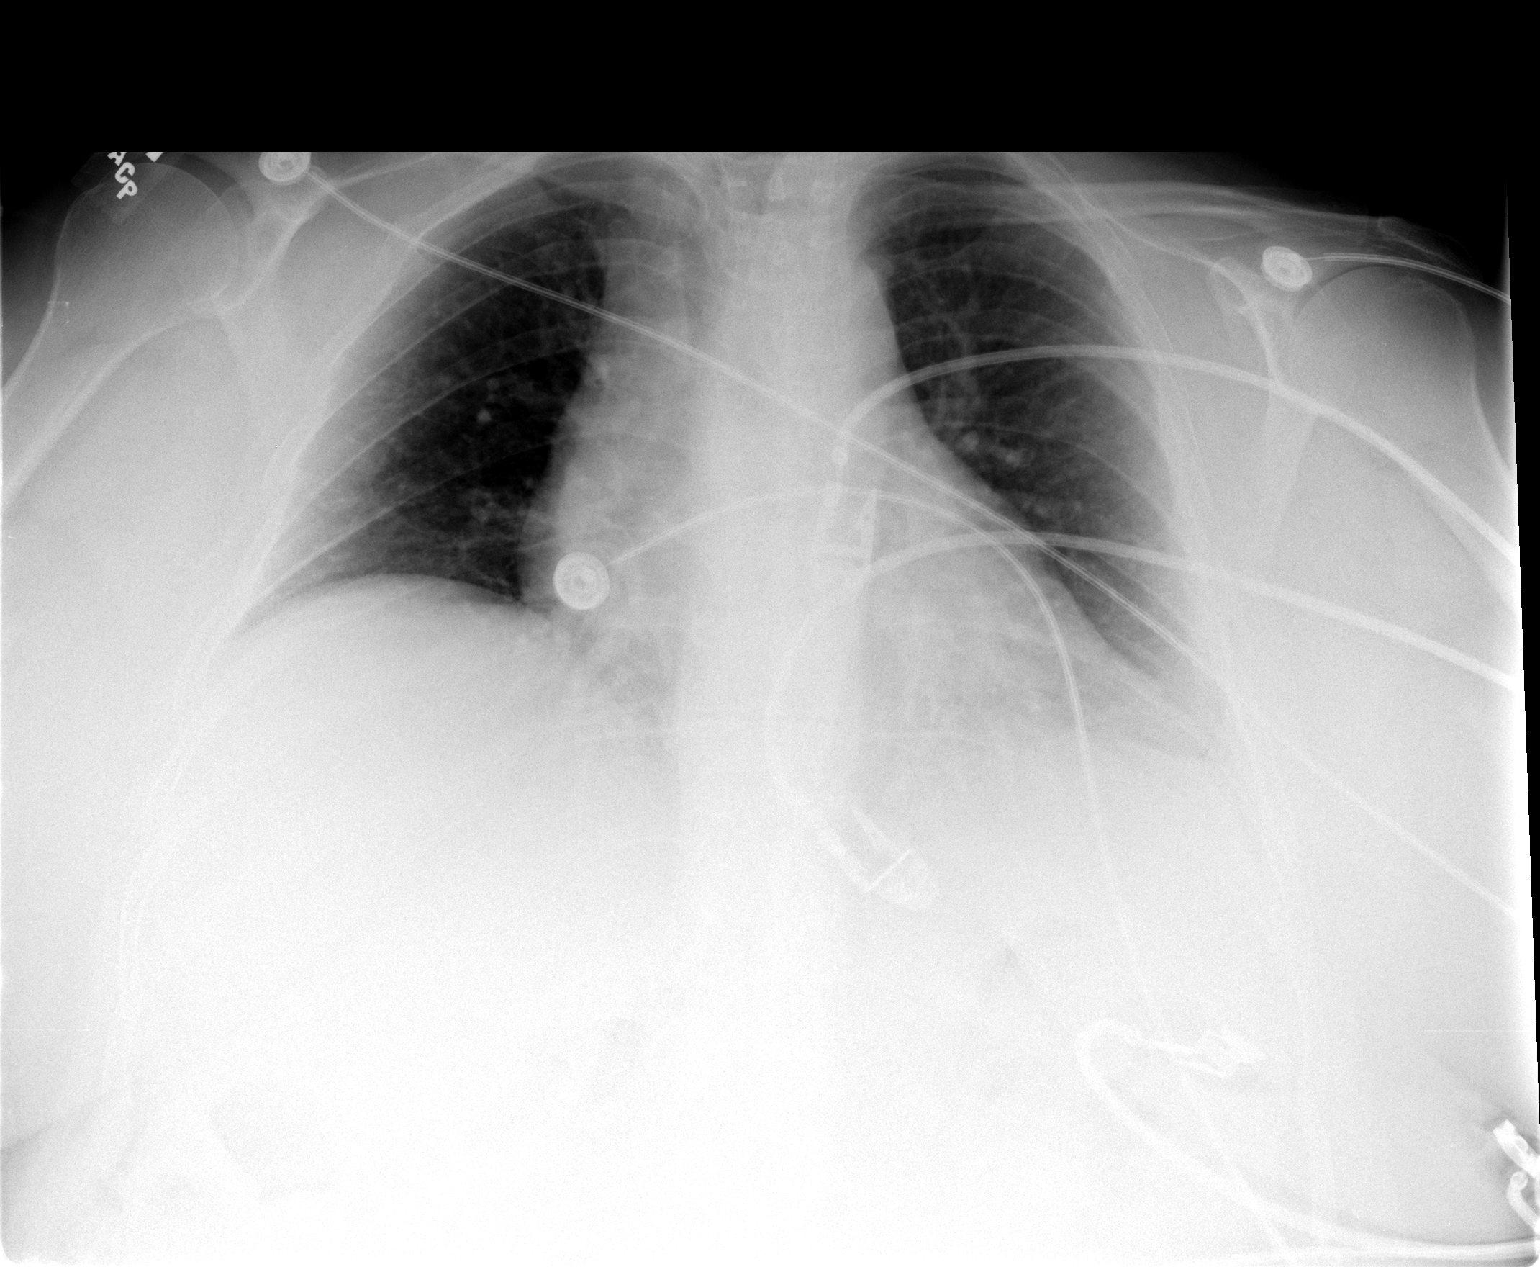

[1 of 1 positions shown; findings below may reference images not displayed]

IMPRESSION: 1. Low lung volumes with no convincing   acute cardiopulmonary disease.

## 2006-12-09 IMAGING — CT CT ANGIO CHEST
2 of 4 series · 19 of 36 positions shown · IV contrast (120 ML OMNI 300)
Comparison: None

CLINICAL DATA: Shortness of breath

CT ANGIOGRAPHY OF CHEST - PULMONARY EMBOLISM PROTOCOL:
TECHNIQUE: Multidetector CT imaging of the chest was performed according to the
protocol for detection of pulmonary embolism during bolus injection of
intravenous contrast.  Coronal and sagittal plane CT angiographic image
reconstructions were also generated.
Contrast:  100 cc Omnipaque 300

[Series 2: pe · axial · 0.78mm/px · z∈[-303,-37]mm · 18 of 241 slices shown]
[im 14/241  lung]
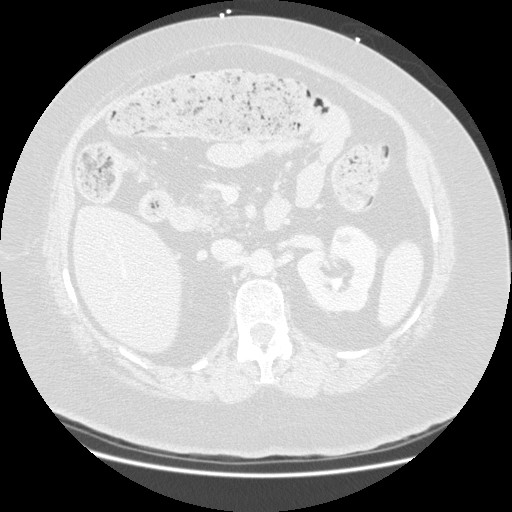
[im 27/241  mediastinal]
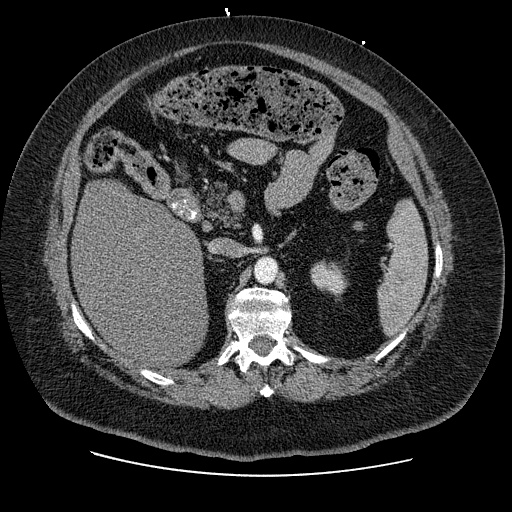
[im 41/241  lung]
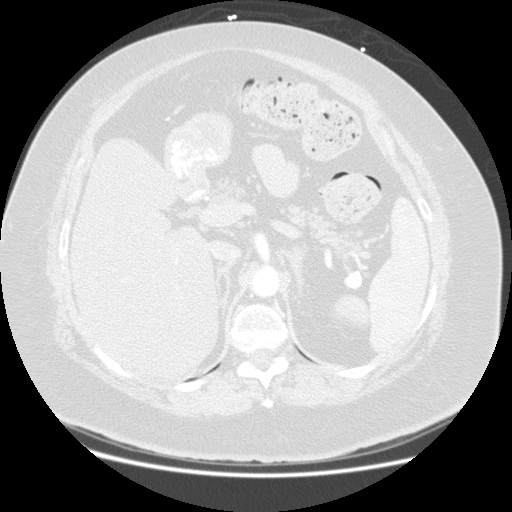
[im 54/241  mediastinal]
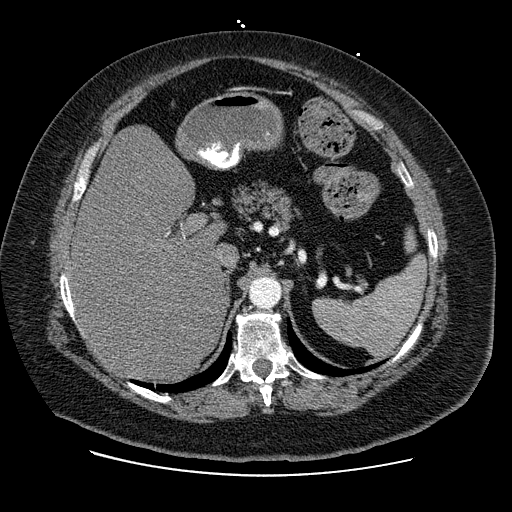
[im 67/241  lung]
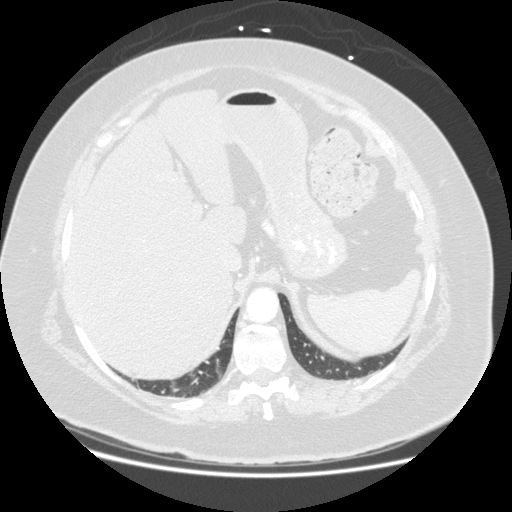
[im 81/241  mediastinal]
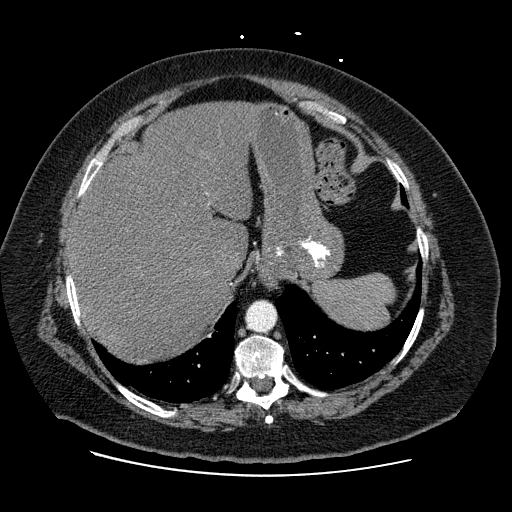
[im 94/241  lung]
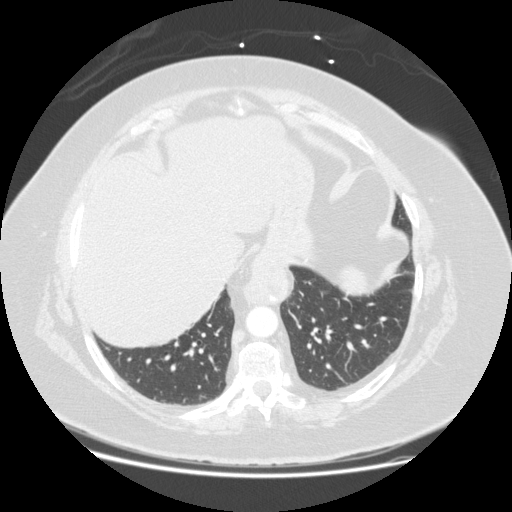
[im 107/241  mediastinal]
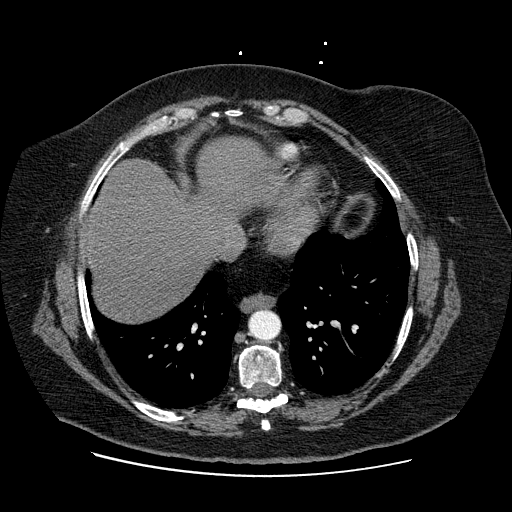
[im 121/241  lung]
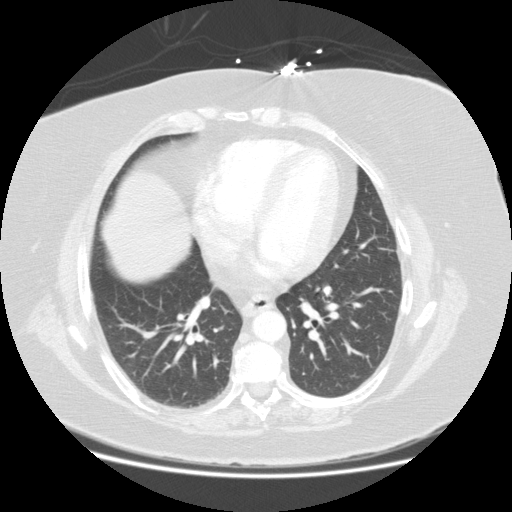
[im 134/241  mediastinal]
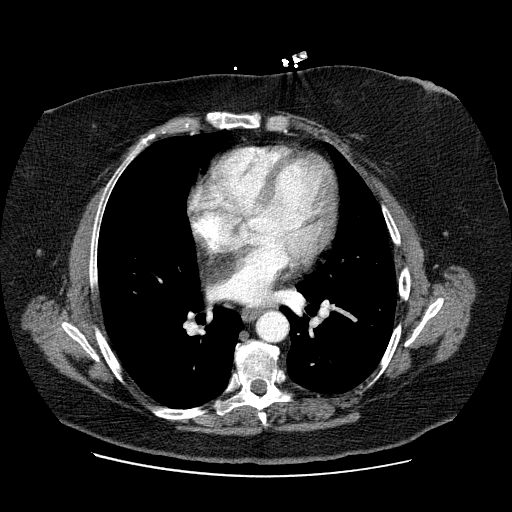
[im 135/241  lung]
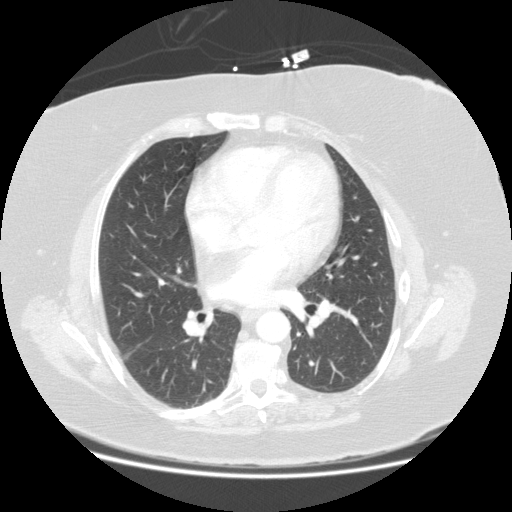
[im 147/241  mediastinal]
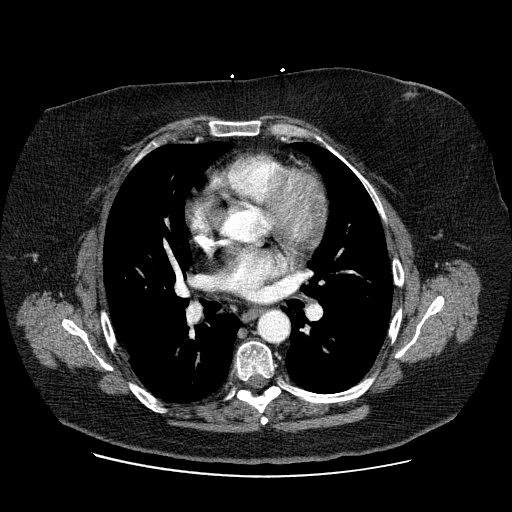
[im 161/241  lung]
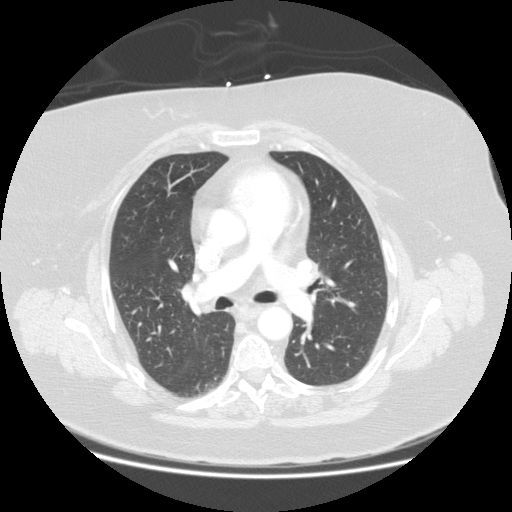
[im 174/241  mediastinal]
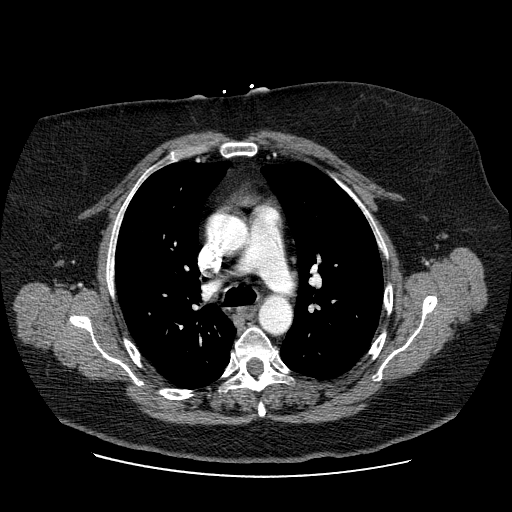
[im 187/241  lung]
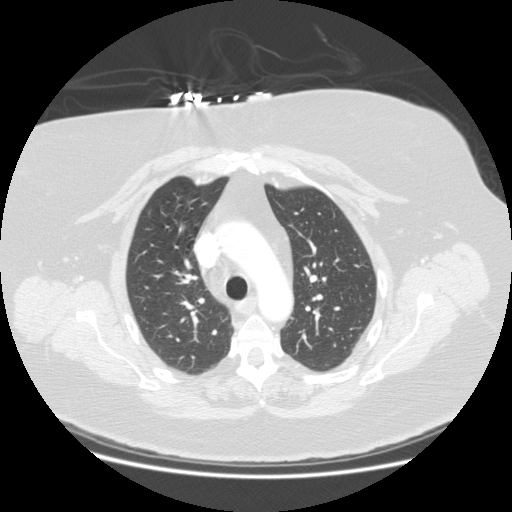
[im 201/241  mediastinal]
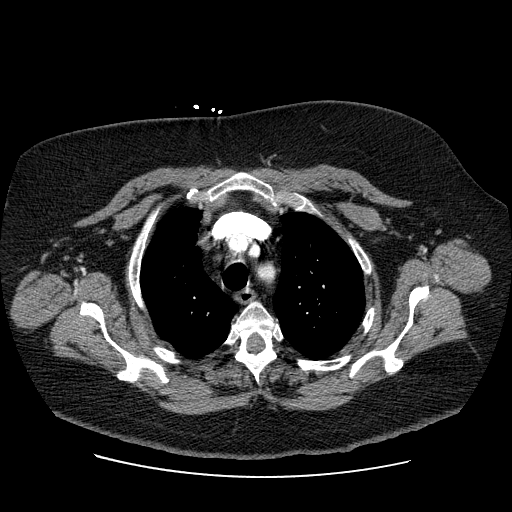
[im 214/241  lung]
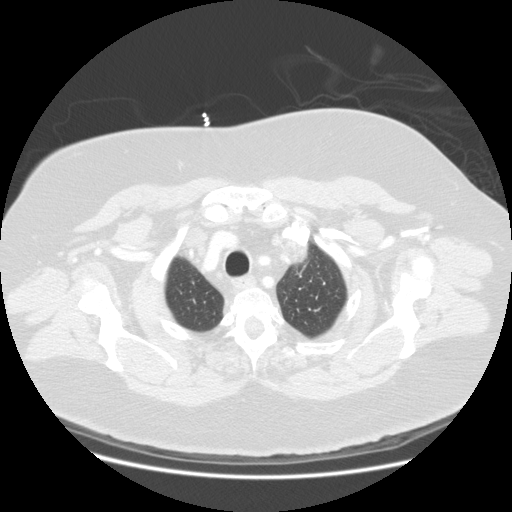
[im 227/241  mediastinal]
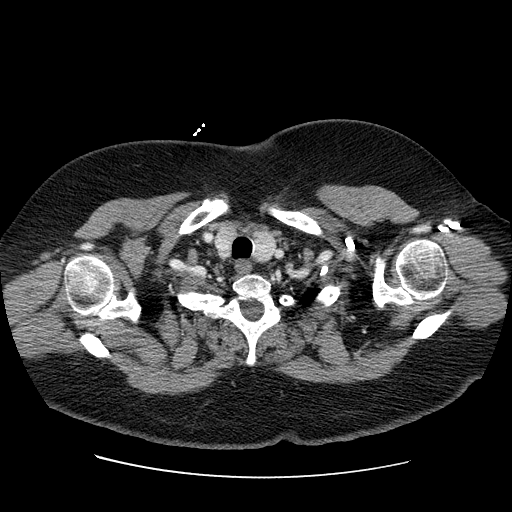

[Series 202: coronal chest · coronal · 0.76mm/px · 1 of 152 slices shown]
[im 76/152  mediastinal]
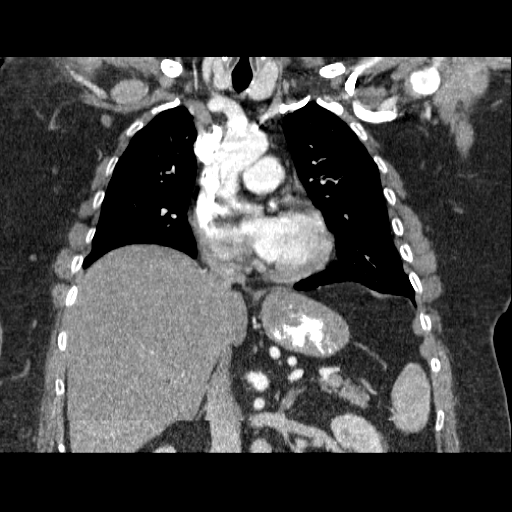

[19 of 36 positions shown; findings below may reference images not displayed]

FINDINGS: No filling defects are seen in the pulmonary arteries to suggest
pulmonary emboli. No pleural or pericardial effusion. No focal opacities. No
mediastinal, hilar, or axillary adenopathy. Heart is normal size. There is a
moderate sized hiatal hernia. Mild fatty infiltration of the liver is noted.
Small low density lesion in the left kidney compatible with cyst.
IMPRESSION: No evidence of pulmonary embolus or acute findings in the chest.

Fatty liver.

Moderate sized hiatal hernia.

## 2007-01-02 ENCOUNTER — Encounter (INDEPENDENT_AMBULATORY_CARE_PROVIDER_SITE_OTHER): Payer: Self-pay | Admitting: Family Medicine

## 2007-01-20 ENCOUNTER — Telehealth (INDEPENDENT_AMBULATORY_CARE_PROVIDER_SITE_OTHER): Payer: Self-pay | Admitting: Family Medicine

## 2007-02-16 ENCOUNTER — Telehealth (INDEPENDENT_AMBULATORY_CARE_PROVIDER_SITE_OTHER): Payer: Self-pay | Admitting: Family Medicine

## 2007-02-24 ENCOUNTER — Ambulatory Visit: Payer: Self-pay | Admitting: Family Medicine

## 2007-02-24 ENCOUNTER — Encounter: Payer: Self-pay | Admitting: *Deleted

## 2007-02-24 ENCOUNTER — Encounter (INDEPENDENT_AMBULATORY_CARE_PROVIDER_SITE_OTHER): Payer: Self-pay | Admitting: Family Medicine

## 2007-02-24 LAB — CONVERTED CEMR LAB
ALT: 43 units/L — ABNORMAL HIGH (ref 0–35)
AST: 23 units/L (ref 0–37)
Albumin: 3.9 g/dL (ref 3.5–5.2)
Alkaline Phosphatase: 58 units/L (ref 39–117)
BUN: 18 mg/dL (ref 6–23)
CO2: 26 meq/L (ref 19–32)
Calcium: 9.3 mg/dL (ref 8.4–10.5)
Chloride: 97 meq/L (ref 96–112)
Creatinine, Ser: 0.82 mg/dL (ref 0.40–1.20)
Glucose, Bld: 155 mg/dL — ABNORMAL HIGH (ref 70–99)
HCT: 39.9 % (ref 36.0–46.0)
Hemoglobin: 13.2 g/dL (ref 12.0–15.0)
MCHC: 33.1 g/dL (ref 30.0–36.0)
MCV: 106.1 fL — ABNORMAL HIGH (ref 78.0–100.0)
Platelets: 231 10*3/uL (ref 150–400)
Potassium: 3 meq/L — ABNORMAL LOW (ref 3.5–5.3)
RBC: 3.76 M/uL — ABNORMAL LOW (ref 3.87–5.11)
RDW: 16.1 % — ABNORMAL HIGH (ref 11.5–14.0)
Sodium: 139 meq/L (ref 135–145)
Total Bilirubin: 0.8 mg/dL (ref 0.3–1.2)
Total Protein: 6.2 g/dL (ref 6.0–8.3)
Uric Acid, Serum: 7.6 mg/dL — ABNORMAL HIGH (ref 2.4–7.0)
WBC: 9.8 10*3/uL (ref 4.0–10.5)

## 2007-02-27 ENCOUNTER — Ambulatory Visit: Payer: Self-pay | Admitting: Family Medicine

## 2007-02-27 ENCOUNTER — Encounter (INDEPENDENT_AMBULATORY_CARE_PROVIDER_SITE_OTHER): Payer: Self-pay | Admitting: *Deleted

## 2007-02-27 LAB — CONVERTED CEMR LAB
BUN: 14 mg/dL (ref 6–23)
CO2: 28 meq/L (ref 19–32)
Calcium: 9.4 mg/dL (ref 8.4–10.5)
Chloride: 97 meq/L (ref 96–112)
Creatinine, Ser: 0.74 mg/dL (ref 0.40–1.20)
Glucose, Bld: 179 mg/dL — ABNORMAL HIGH (ref 70–99)
Magnesium: 1.7 mg/dL (ref 1.5–2.5)
Potassium: 2.9 meq/L — ABNORMAL LOW (ref 3.5–5.3)
Sodium: 138 meq/L (ref 135–145)

## 2007-05-04 ENCOUNTER — Ambulatory Visit: Payer: Self-pay | Admitting: Family Medicine

## 2007-05-04 DIAGNOSIS — E1142 Type 2 diabetes mellitus with diabetic polyneuropathy: Secondary | ICD-10-CM | POA: Insufficient documentation

## 2007-05-09 ENCOUNTER — Inpatient Hospital Stay (HOSPITAL_COMMUNITY): Admission: RE | Admit: 2007-05-09 | Discharge: 2007-05-12 | Payer: Self-pay | Admitting: Orthopedic Surgery

## 2007-05-16 ENCOUNTER — Encounter: Payer: Self-pay | Admitting: Emergency Medicine

## 2007-05-17 ENCOUNTER — Ambulatory Visit: Payer: Self-pay | Admitting: Family Medicine

## 2007-05-17 ENCOUNTER — Telehealth: Payer: Self-pay | Admitting: *Deleted

## 2007-05-17 ENCOUNTER — Inpatient Hospital Stay (HOSPITAL_COMMUNITY): Admission: EM | Admit: 2007-05-17 | Discharge: 2007-05-24 | Payer: Self-pay | Admitting: Family Medicine

## 2007-05-24 ENCOUNTER — Ambulatory Visit: Payer: Self-pay | Admitting: *Deleted

## 2007-05-24 ENCOUNTER — Inpatient Hospital Stay (HOSPITAL_COMMUNITY): Admission: AD | Admit: 2007-05-24 | Discharge: 2007-05-25 | Payer: Self-pay | Admitting: *Deleted

## 2007-06-05 ENCOUNTER — Telehealth (INDEPENDENT_AMBULATORY_CARE_PROVIDER_SITE_OTHER): Payer: Self-pay | Admitting: Family Medicine

## 2007-06-07 ENCOUNTER — Ambulatory Visit: Payer: Self-pay | Admitting: Family Medicine

## 2007-06-07 ENCOUNTER — Telehealth (INDEPENDENT_AMBULATORY_CARE_PROVIDER_SITE_OTHER): Payer: Self-pay | Admitting: *Deleted

## 2007-06-20 ENCOUNTER — Ambulatory Visit: Payer: Self-pay | Admitting: Family Medicine

## 2007-06-28 ENCOUNTER — Telehealth (INDEPENDENT_AMBULATORY_CARE_PROVIDER_SITE_OTHER): Payer: Self-pay | Admitting: Family Medicine

## 2007-06-30 ENCOUNTER — Encounter (INDEPENDENT_AMBULATORY_CARE_PROVIDER_SITE_OTHER): Payer: Self-pay | Admitting: Family Medicine

## 2007-06-30 ENCOUNTER — Ambulatory Visit: Payer: Self-pay | Admitting: Family Medicine

## 2007-06-30 LAB — CONVERTED CEMR LAB
BUN: 23 mg/dL (ref 6–23)
CO2: 29 meq/L (ref 19–32)
Calcium: 9.8 mg/dL (ref 8.4–10.5)
Chloride: 96 meq/L (ref 96–112)
Creatinine, Ser: 1.14 mg/dL (ref 0.40–1.20)
Glucose, Bld: 192 mg/dL — ABNORMAL HIGH (ref 70–99)
Hgb A1c MFr Bld: 6.2 %
Potassium: 2.8 meq/L — ABNORMAL LOW (ref 3.5–5.3)
Sodium: 141 meq/L (ref 135–145)

## 2007-07-10 ENCOUNTER — Telehealth: Payer: Self-pay | Admitting: *Deleted

## 2007-07-11 ENCOUNTER — Inpatient Hospital Stay (HOSPITAL_COMMUNITY): Admission: RE | Admit: 2007-07-11 | Discharge: 2007-07-15 | Payer: Self-pay | Admitting: Orthopedic Surgery

## 2007-07-11 ENCOUNTER — Telehealth (INDEPENDENT_AMBULATORY_CARE_PROVIDER_SITE_OTHER): Payer: Self-pay | Admitting: *Deleted

## 2007-08-16 ENCOUNTER — Telehealth (INDEPENDENT_AMBULATORY_CARE_PROVIDER_SITE_OTHER): Payer: Self-pay | Admitting: Family Medicine

## 2007-08-31 ENCOUNTER — Encounter: Admission: RE | Admit: 2007-08-31 | Discharge: 2007-08-31 | Payer: Self-pay | Admitting: Internal Medicine

## 2007-09-06 ENCOUNTER — Encounter: Admission: RE | Admit: 2007-09-06 | Discharge: 2007-09-06 | Payer: Self-pay | Admitting: Family Medicine

## 2007-09-18 ENCOUNTER — Encounter (INDEPENDENT_AMBULATORY_CARE_PROVIDER_SITE_OTHER): Payer: Self-pay | Admitting: Family Medicine

## 2007-10-29 IMAGING — CR DG CHEST 1V PORT
1 series · 1 of 1 positions shown · non-contrast
Comparison: 09/14/04.

CLINICAL DATA: 56-year-old with altered level of consciousness. 
 PORTABLE CHEST - 1 VIEW:

[view not recorded]
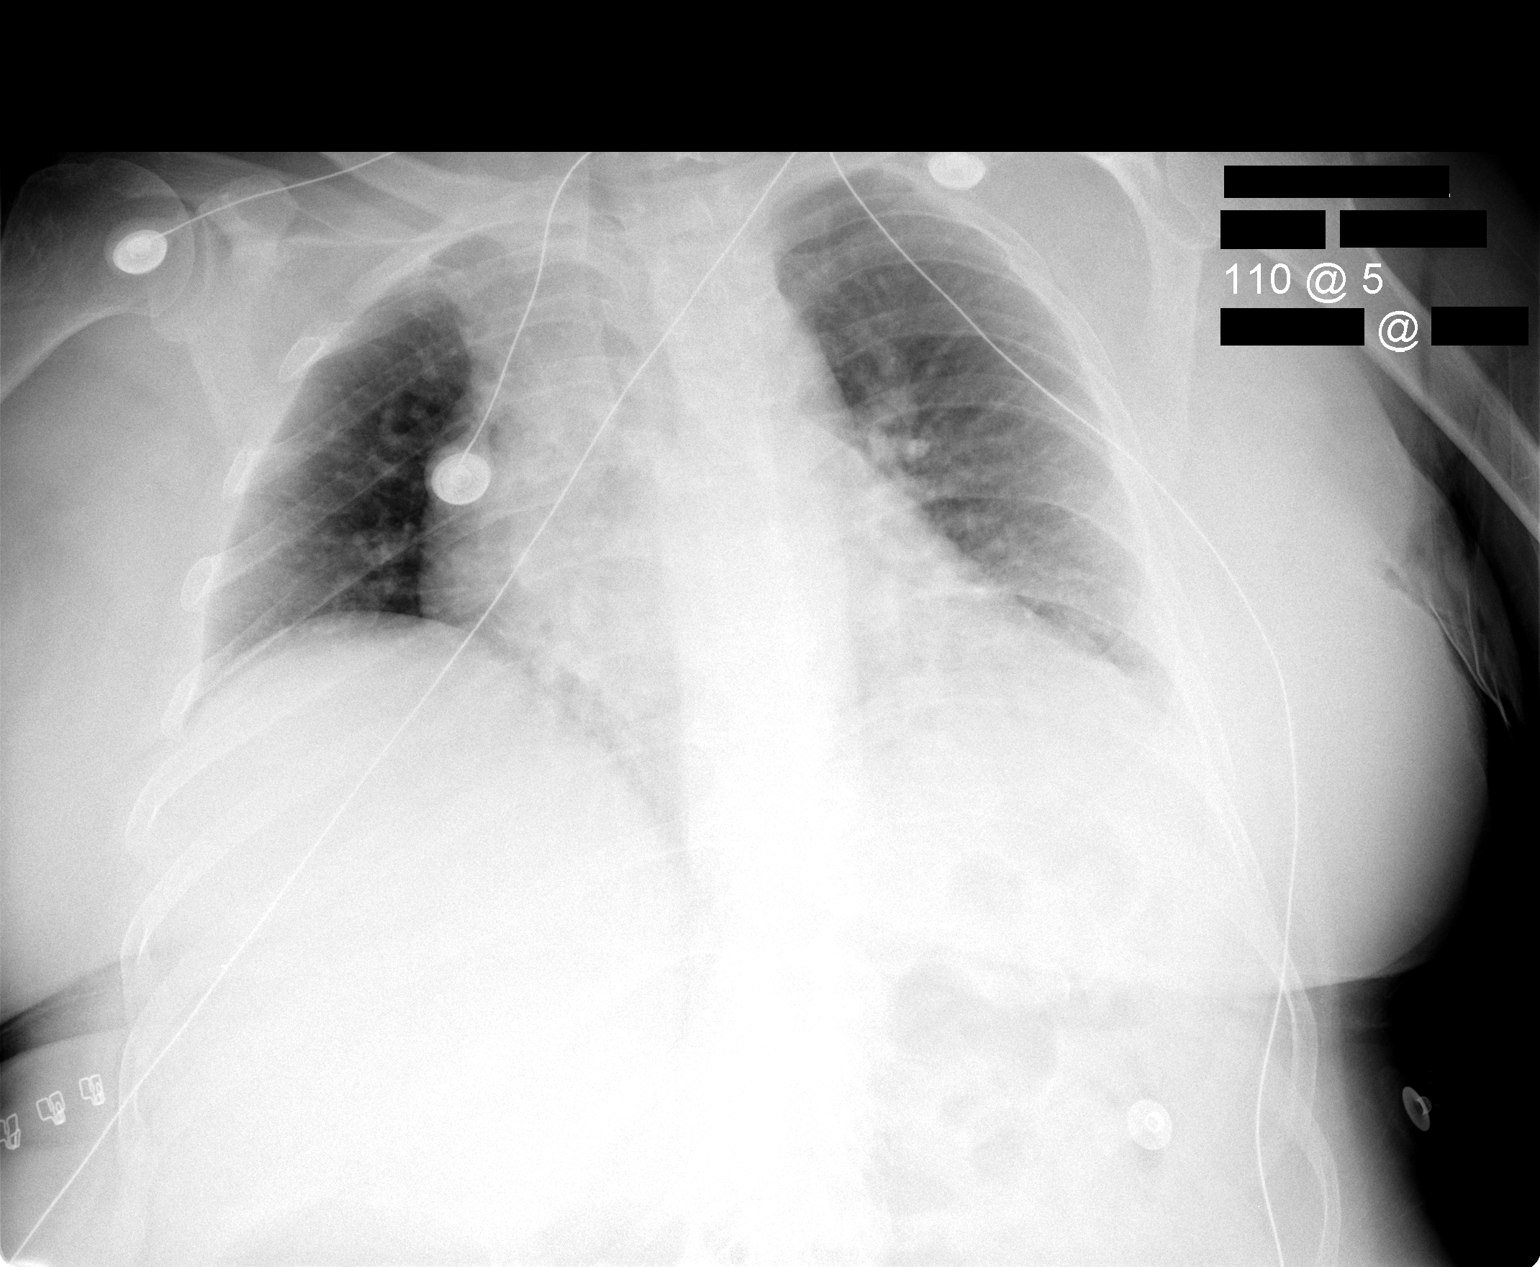

[1 of 1 positions shown; findings below may reference images not displayed]

FINDINGS: Very low volume chest film with vascular crowding and atelectasis. Heart size is accentuated. There is moderate elevation of the right hemidiaphragm. No definite effusions.
IMPRESSION: Very low volume chest film with vascular crowding and atelectasis. Mild vascular congestion without overt pulmonary edema.

## 2007-10-29 IMAGING — CT CT HEAD W/O CM
1 series · 16 of 30 positions shown, 20 images · IV contrast (agent unspecified)
Comparison: None

CLINICAL DATA: Altered level of consciousness

HEAD CT WITHOUT CONTRAST:
TECHNIQUE: 5mm collimated images were obtained from the base of the skull
through the vertex according to standard protocol without contrast.

[Series 2: head_seq 4.5 h37s st · axial · 0.43mm/px · z∈[+1123,+1271]mm · 16 of 36 slices shown, 20 images]
[im 2/36  brain]
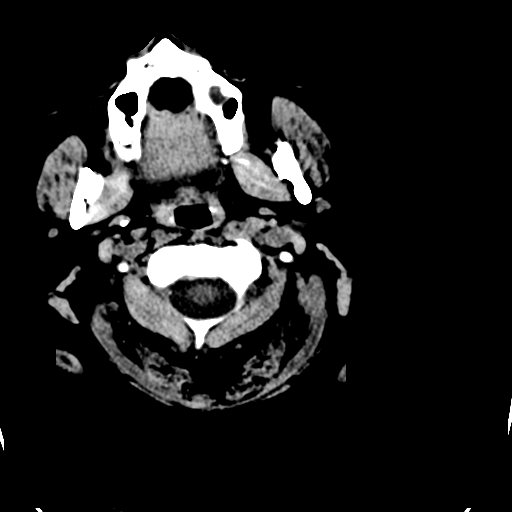
[im 2/36  bone]
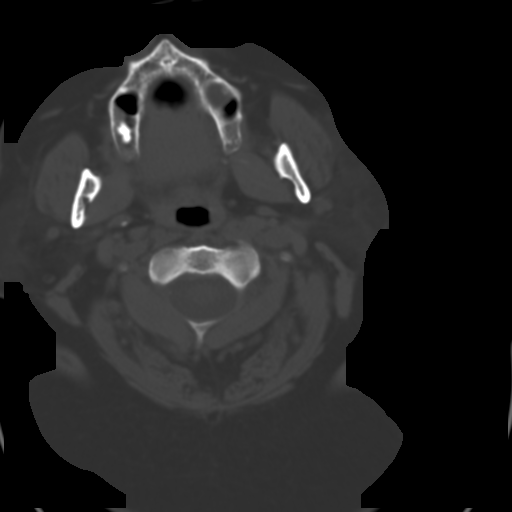
[im 4/36  brain]
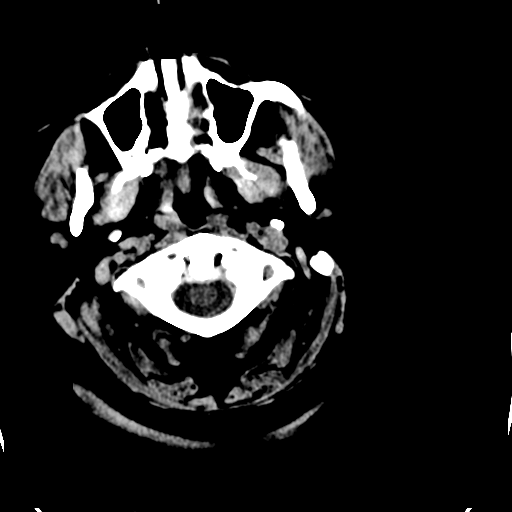
[im 7/36  brain]
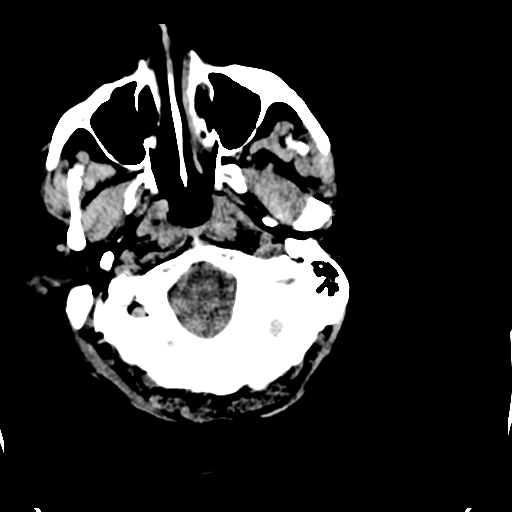
[im 9/36  brain]
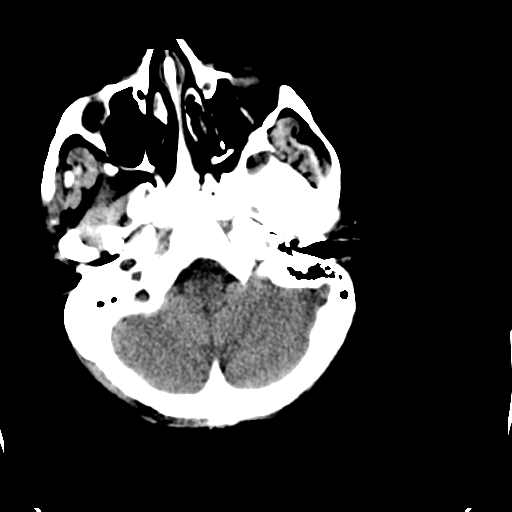
[im 10/36  brain]
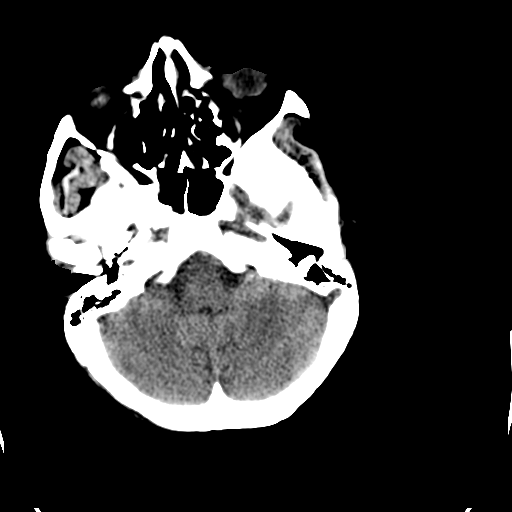
[im 10/36  bone]
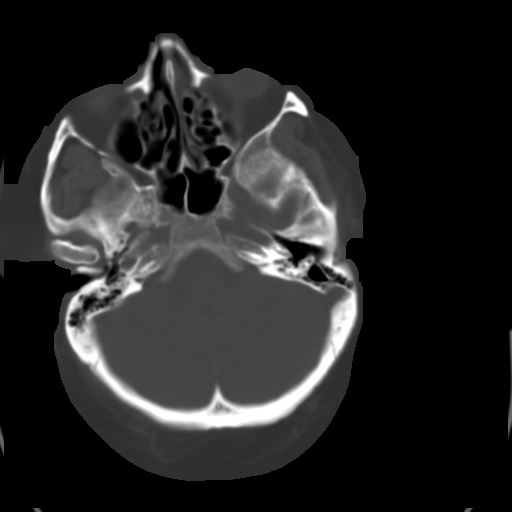
[im 13/36  brain]
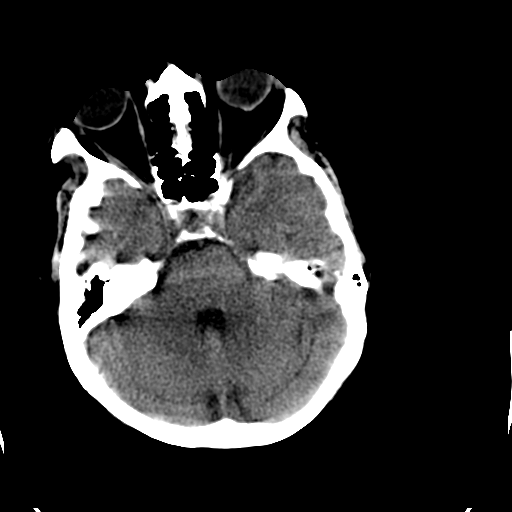
[im 15/36  brain]
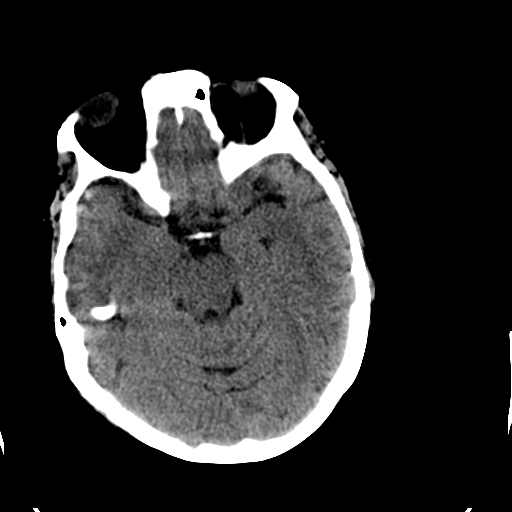
[im 17/36  brain]
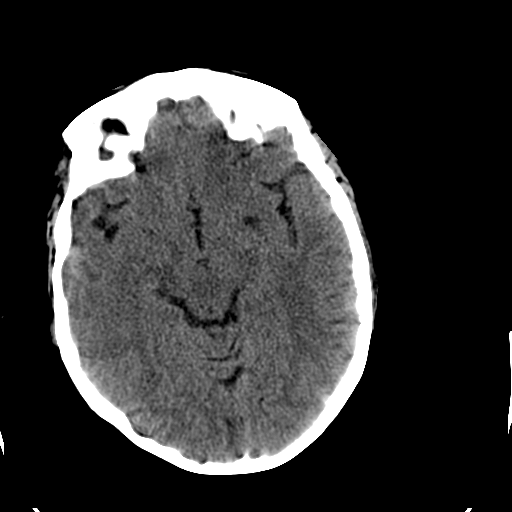
[im 19/36  brain]
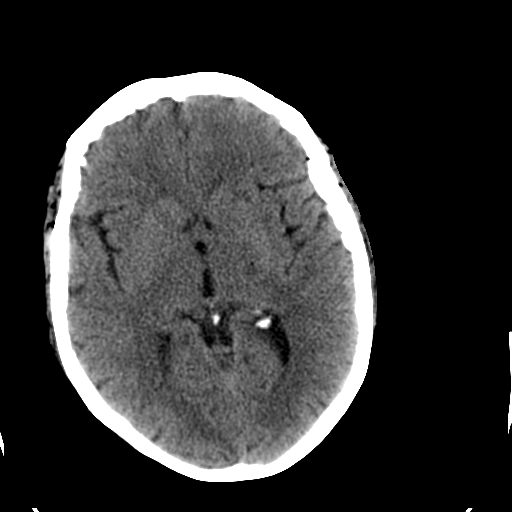
[im 19/36  bone]
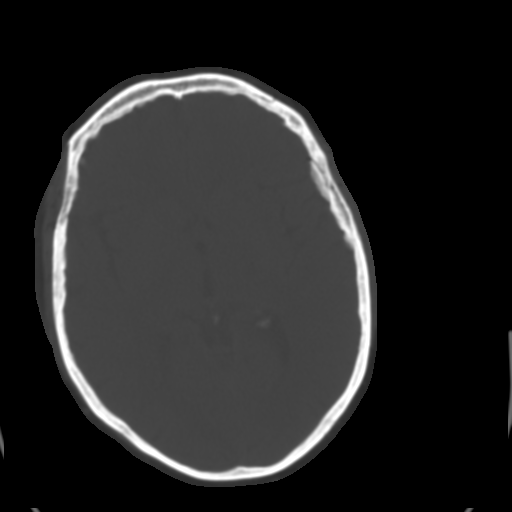
[im 21/36  brain]
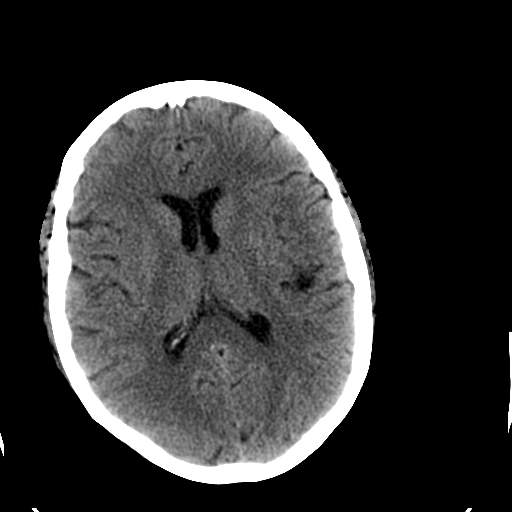
[im 23/36  brain]
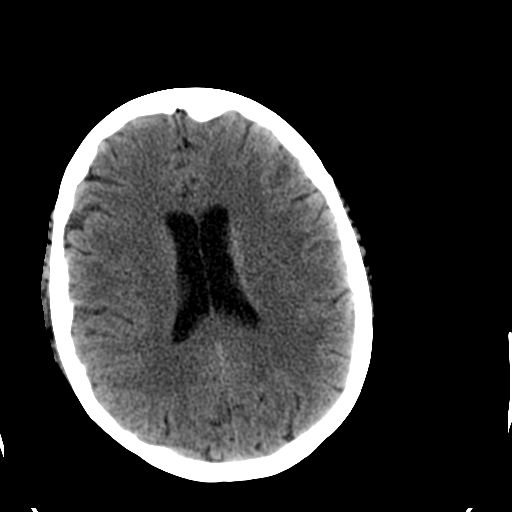
[im 26/36  brain]
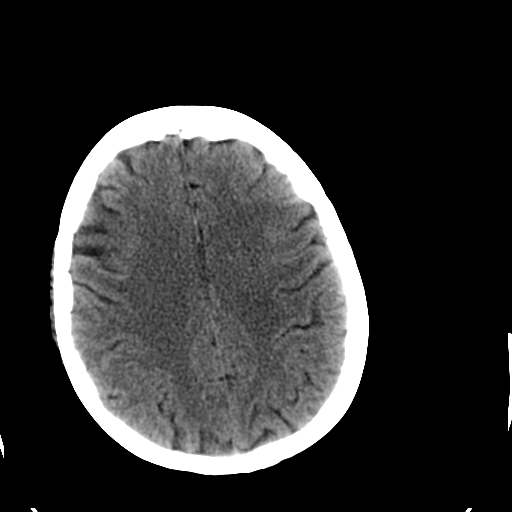
[im 27/36  brain]
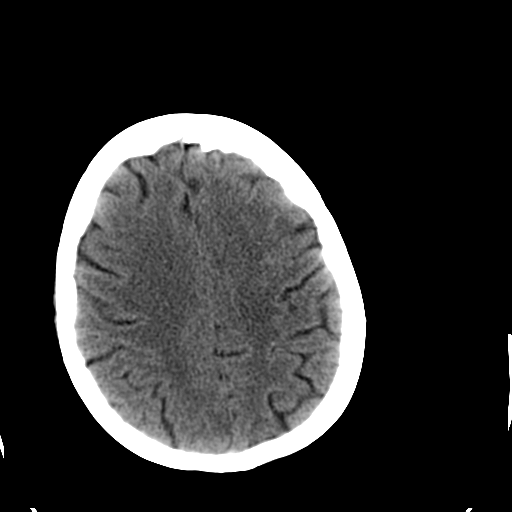
[im 27/36  bone]
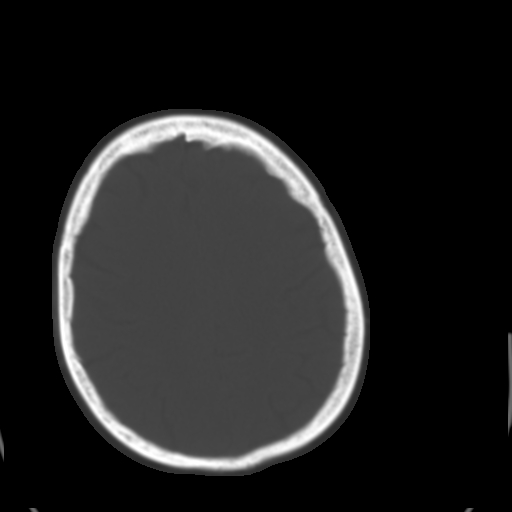
[im 29/36  brain]
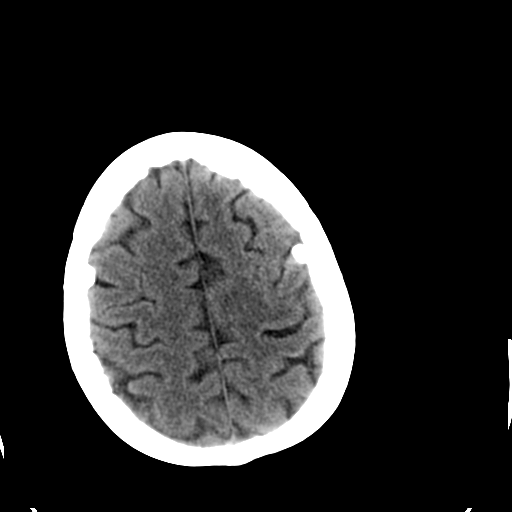
[im 32/36  brain]
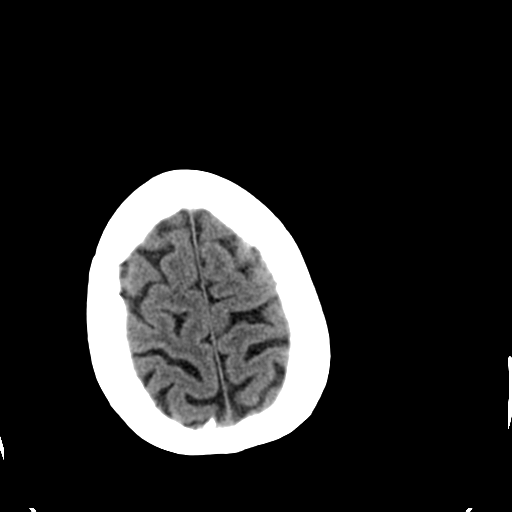
[im 34/36  brain]
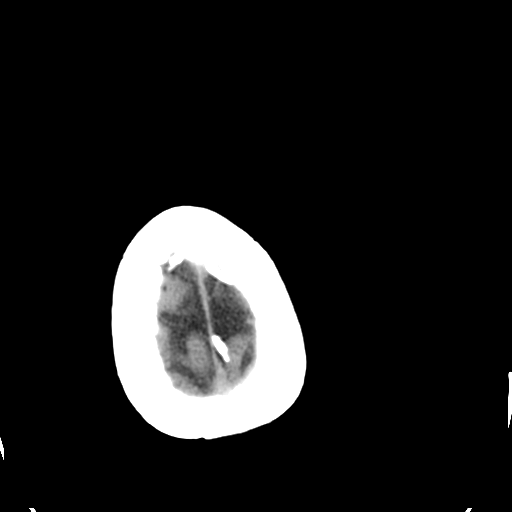

[16 of 30 positions shown; findings below may reference images not displayed]

FINDINGS: There is no evidence of intracranial hemorrhage, hydrocephalus, mass
lesion, or acute infarction.  No abnormal extra-axial fluid collections
identified.  No skull abnormalities are noted.
IMPRESSION: No acute intracranial abnormality.

## 2007-10-30 IMAGING — CT CT ANGIO CHEST
2 of 5 series · 19 of 36 positions shown · IV contrast (APPLIED)
Comparison: 05/17/07 chest x-ray and 06/26/06

CLINICAL DATA: 56 year old female, elevated D-dimer, status post right total knee replacement.  Irregular heartbeat, diabetes.
CT ANGIOGRAPHY OF CHEST:
TECHNIQUE: Multidetector CT imaging of the chest was performed during bolus injection of intravenous contrast.  Multiplanar CT angiographic image reconstructions were generated to evaluate the vascular anatomy.
Contrast:  80 cc Omnipaque 300

[Series 8: pulm embolism 1.0 b25f thins · axial · 0.69mm/px · z∈[+1446,+1638]mm · 16 of 218 slices shown]
[im 13/218  lung]
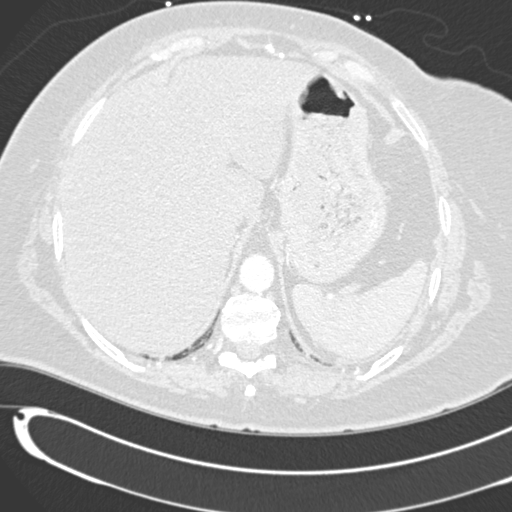
[im 26/218  mediastinal]
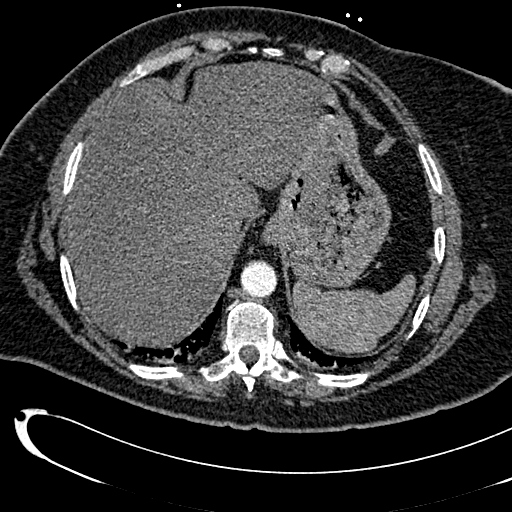
[im 39/218  lung]
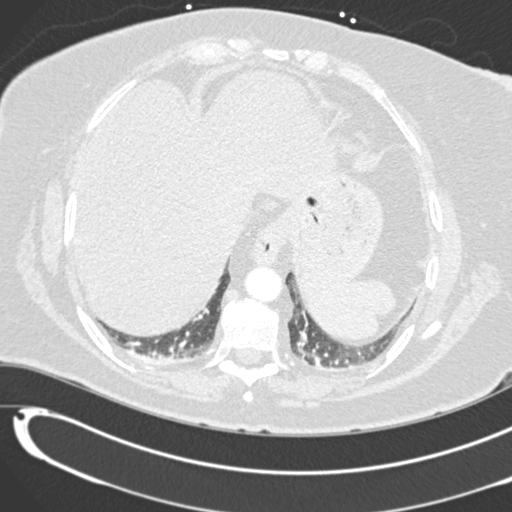
[im 52/218  mediastinal]
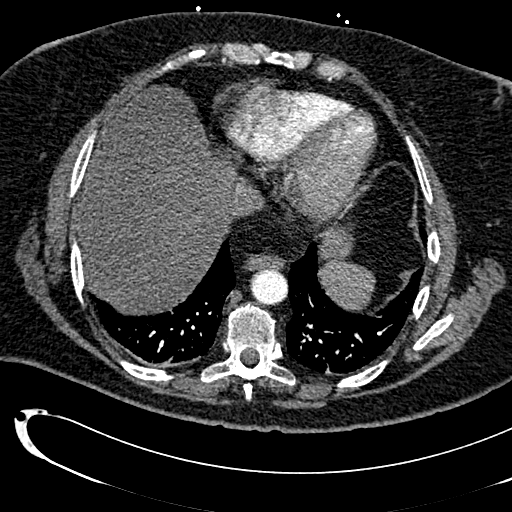
[im 64/218  lung]
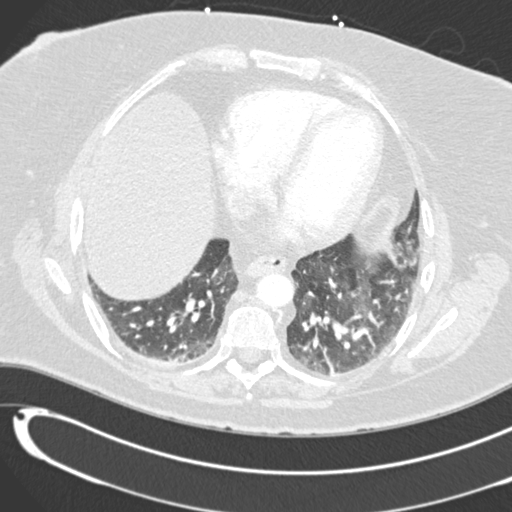
[im 77/218  mediastinal]
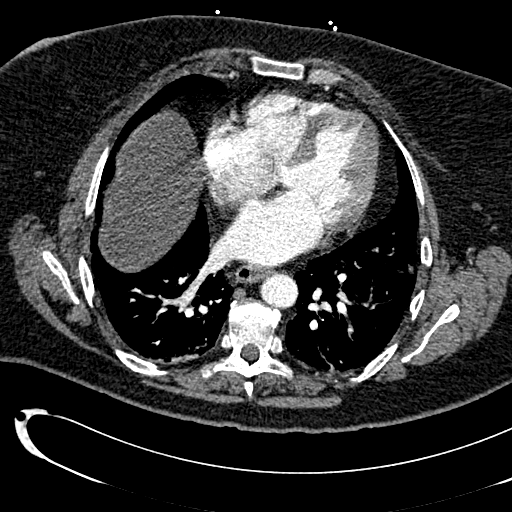
[im 90/218  lung]
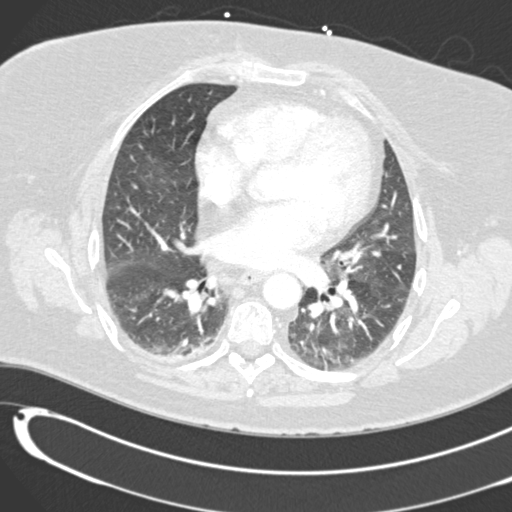
[im 103/218  mediastinal]
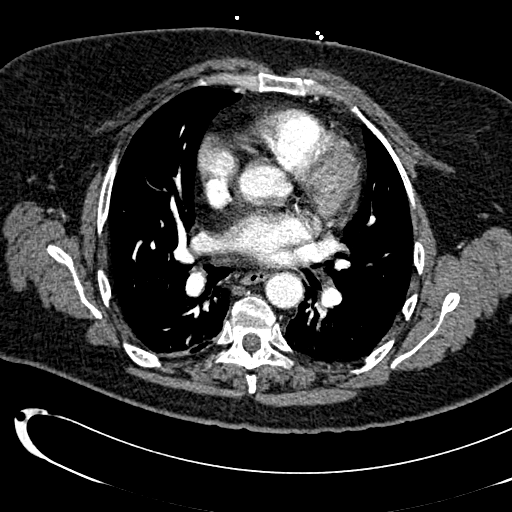
[im 115/218  lung]
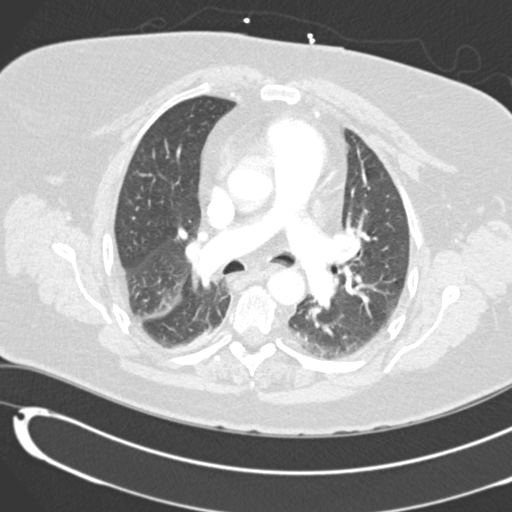
[im 128/218  mediastinal]
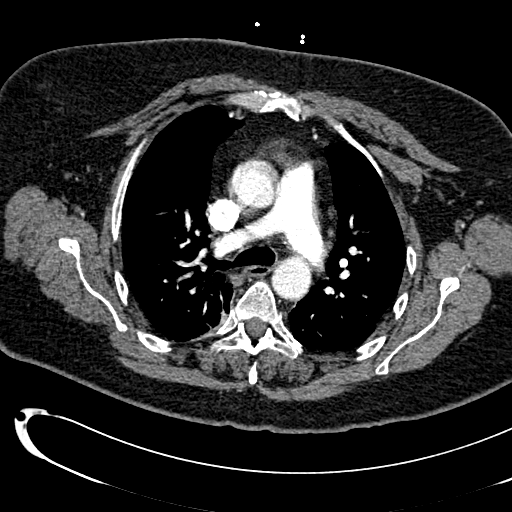
[im 141/218  lung]
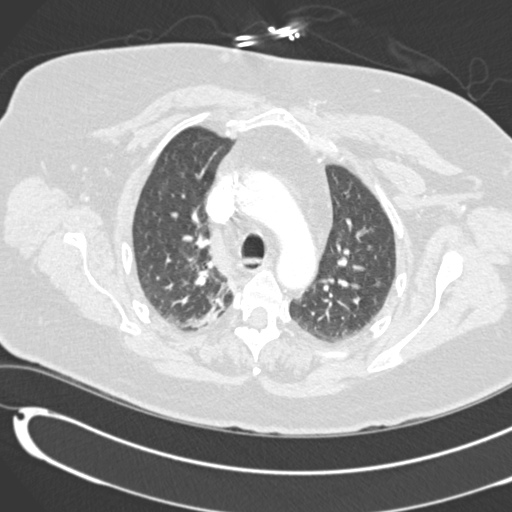
[im 154/218  mediastinal]
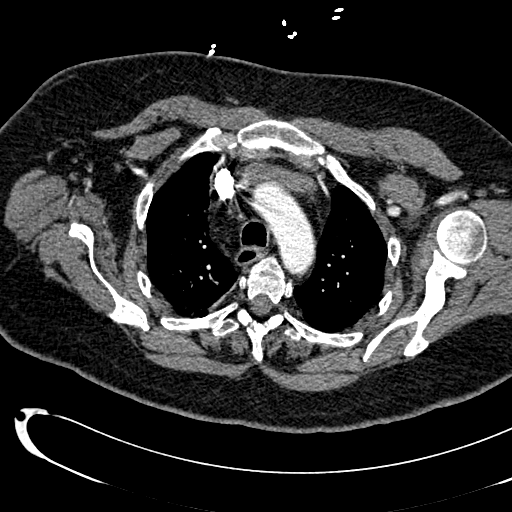
[im 166/218  lung]
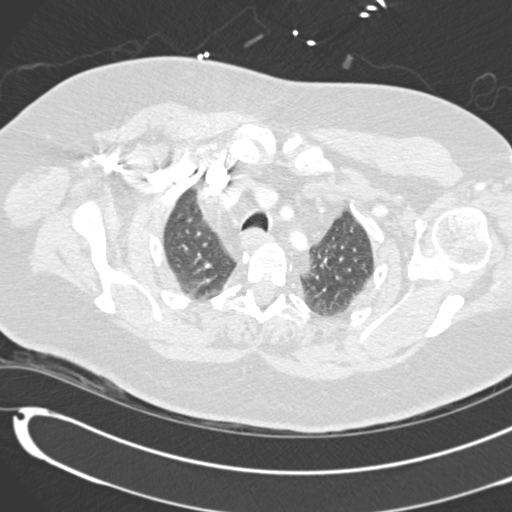
[im 179/218  mediastinal]
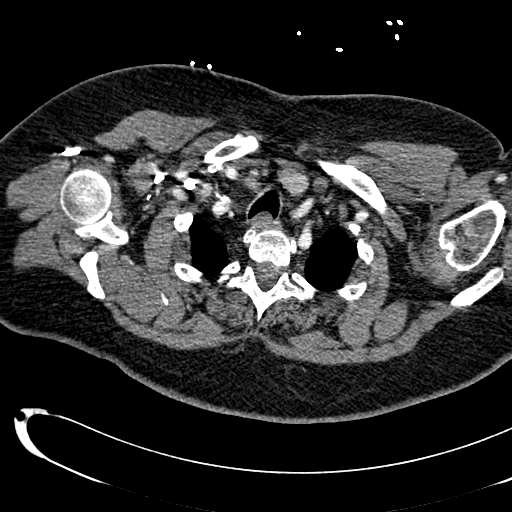
[im 192/218  lung]
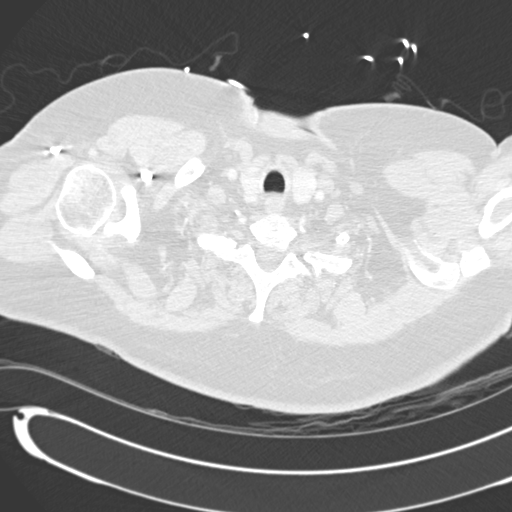
[im 205/218  mediastinal]
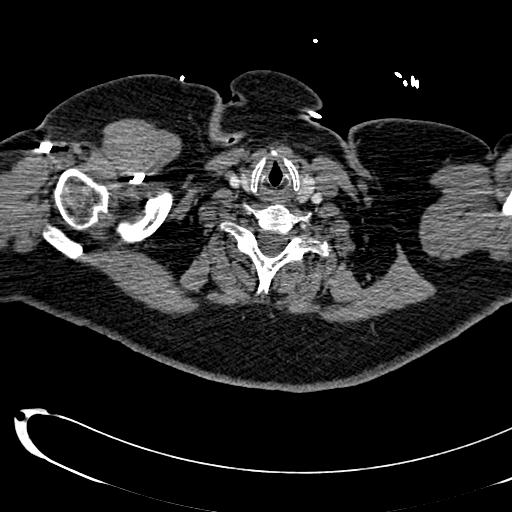

[Series 602: coronal · coronal · 0.69mm/px · 3 of 128 slices shown]
[im 26/128  mediastinal]
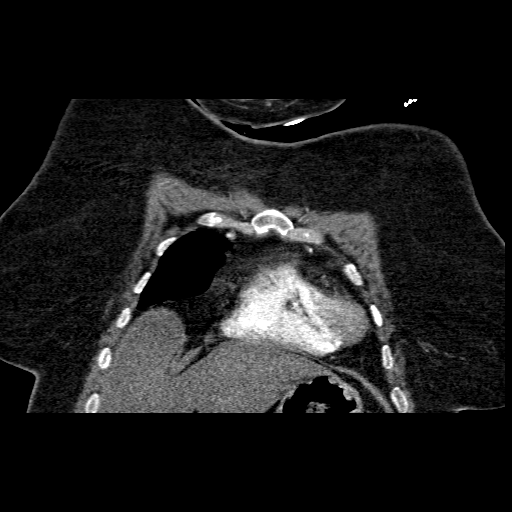
[im 51/128  mediastinal]
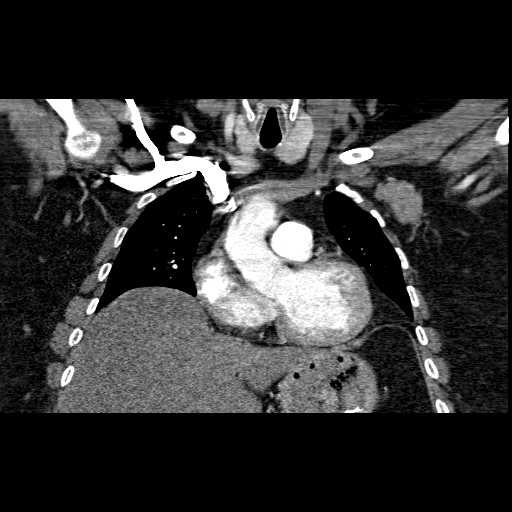
[im 77/128  mediastinal]
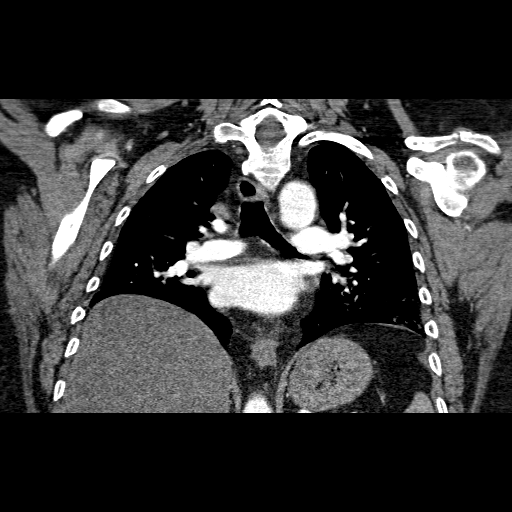

[19 of 36 positions shown; findings below may reference images not displayed]

FINDINGS: Enhanced pulmonary arteries are well visualized.  No acute filling defect to suggest significant thromboembolic disease or PE to the chest.  Intact thoracic aorta.  Motion artifact is noted over the ascending aorta.  Brachiocephalic, left common carotid, and left subclavian arteries are patent.  Mild mediastinal lipomatosis is present.  No axillary, mediastinal, or hilar adenopathy.  Prominent heart size.  No pericardial or pleural effusion.  Dependent bibasilar atelectasis involving the lower lobes, lingula, and right middle lobe.
IMPRESSION: 1.  Negative for acute pulmonary embolus.
2.  No adenopathy or acute airspace process.
3.  Dependent bilateral atelectasis pattern related to low lung volumes.

## 2007-10-30 IMAGING — CR DG CHEST 1V PORT
1 series · 1 of 1 positions shown · non-contrast
Comparison: 05/16/07.

CLINICAL DATA: Abnormal chest sounds.
 PORTABLE CHEST - 1 VIEW 05/17/07 AT 4624 HOURS:

[view not recorded]
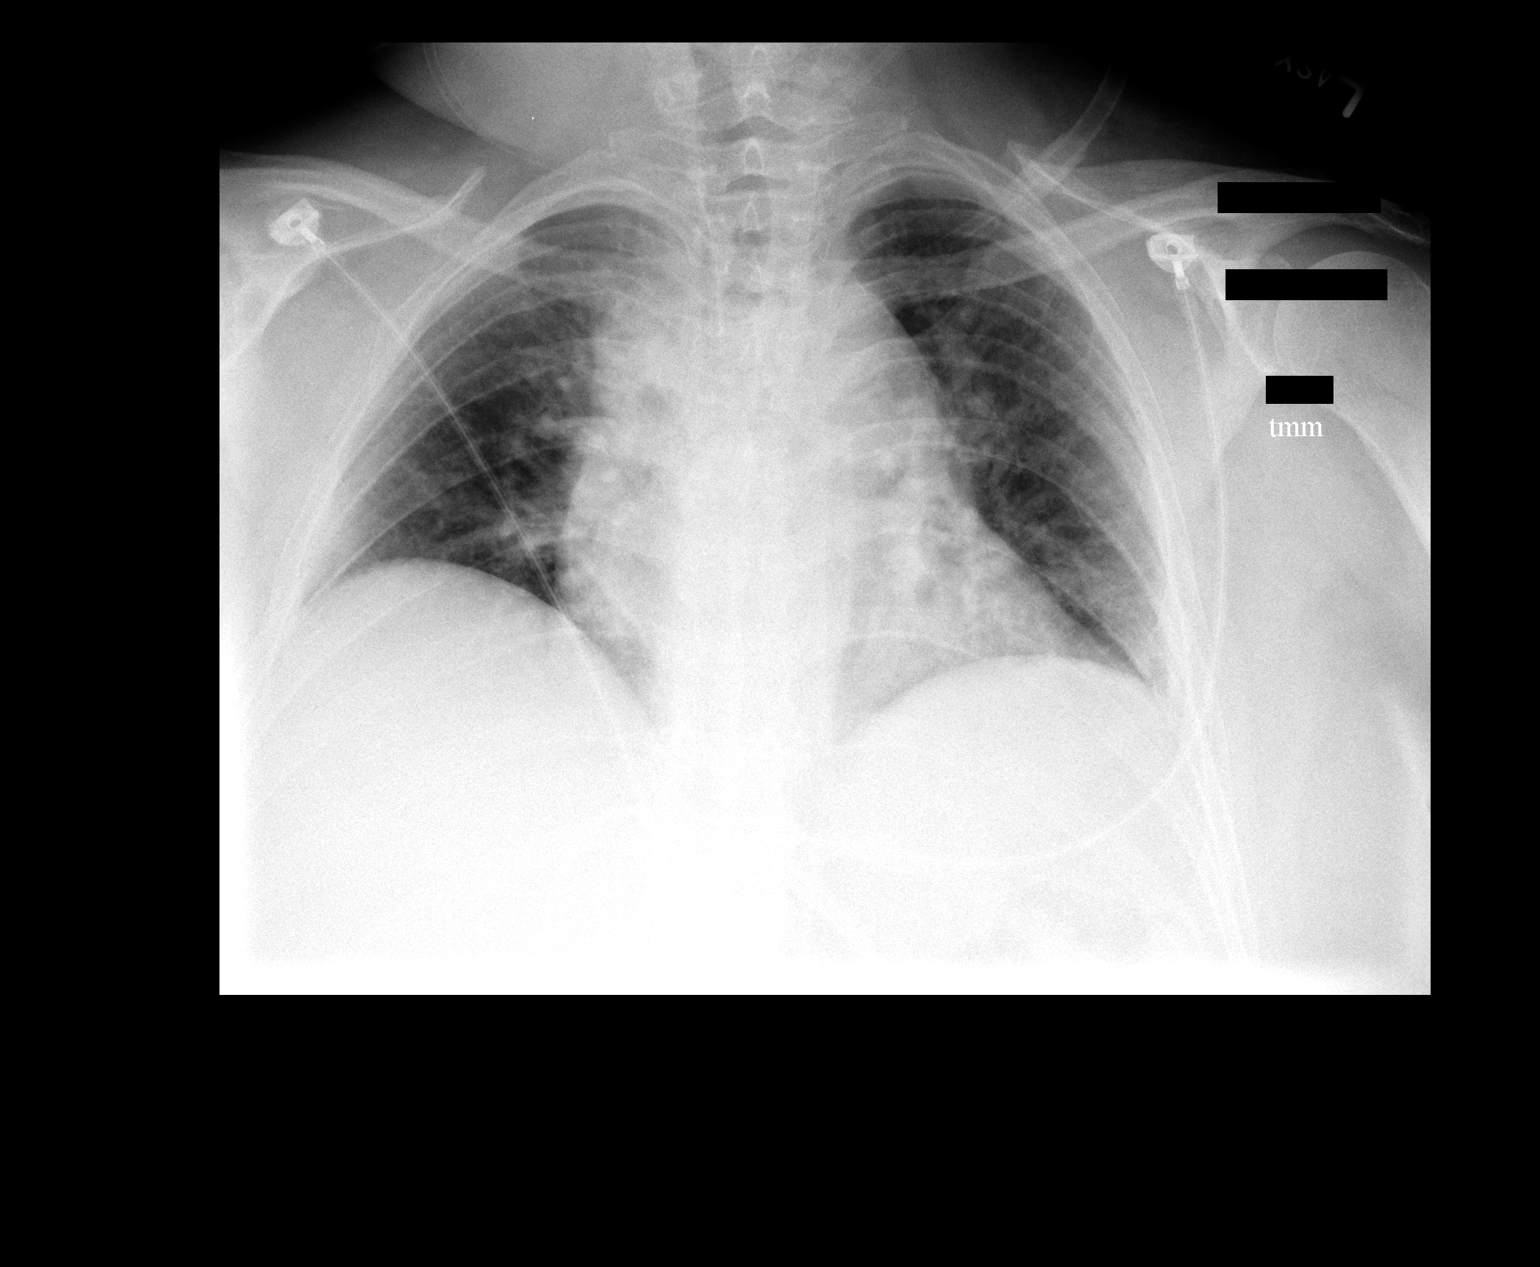

[1 of 1 positions shown; findings below may reference images not displayed]

FINDINGS: Lungs are under inflated with bibasilar atelectasis.  This causes prominence of the cardiac silhouette.  Vascular congestion without pulmonary edema is noted.  No pneumothorax.
IMPRESSION: No interval change.

## 2007-11-03 IMAGING — CT CT HEAD W/O CM
1 of 2 series · 13 of 30 positions shown, 17 images · IV contrast (agent unspecified)
Comparison: 05/16/07.

CLINICAL DATA: Code stroke.  Totally unresponsive.
 HEAD CT WITHOUT CONTRAST:
TECHNIQUE: Contiguous axial images were obtained from the base of the skull through the vertex according to standard protocol without contrast.

[Series 2: brain · axial · 0.47mm/px · z∈[+126,+260]mm · 13 of 28 slices shown, 17 images]
[im 2/28  brain]
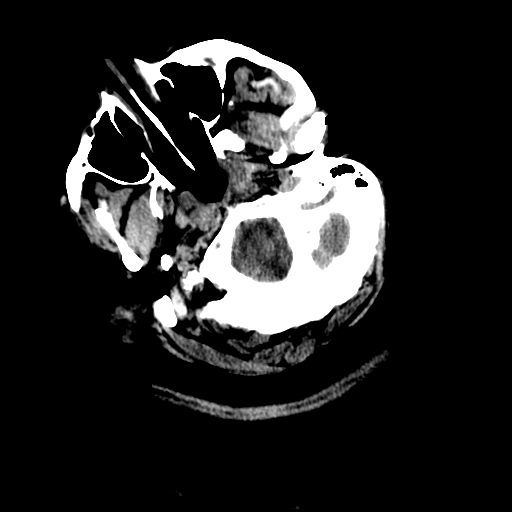
[im 2/28  bone]
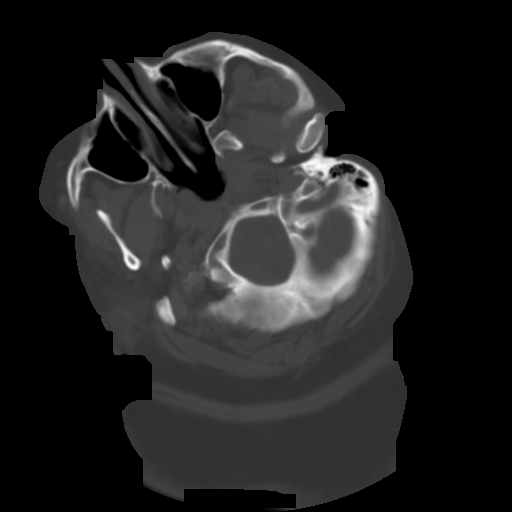
[im 4/28  brain]
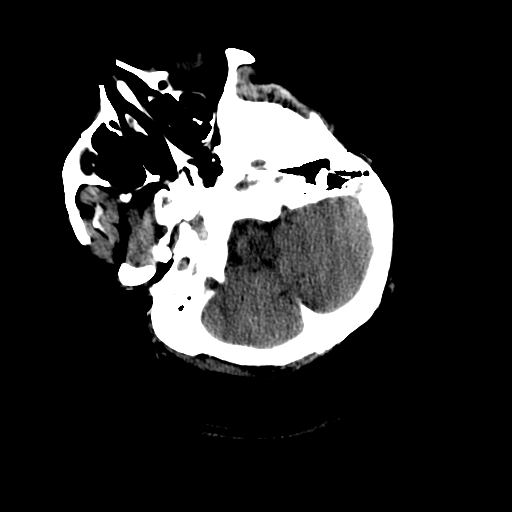
[im 6/28  brain]
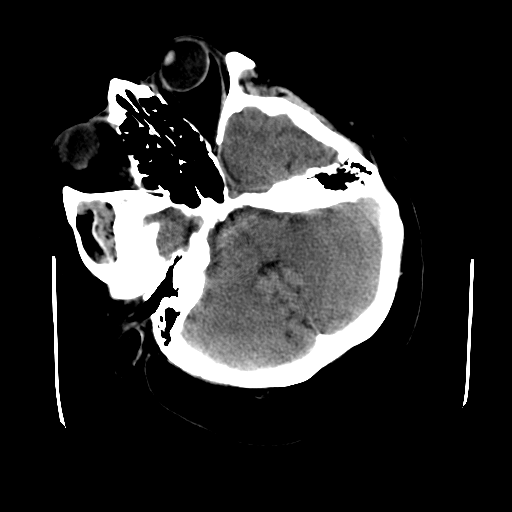
[im 8/28  brain]
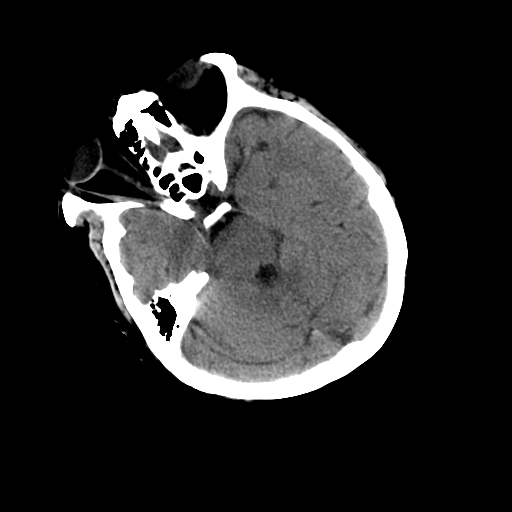
[im 10/28  brain]
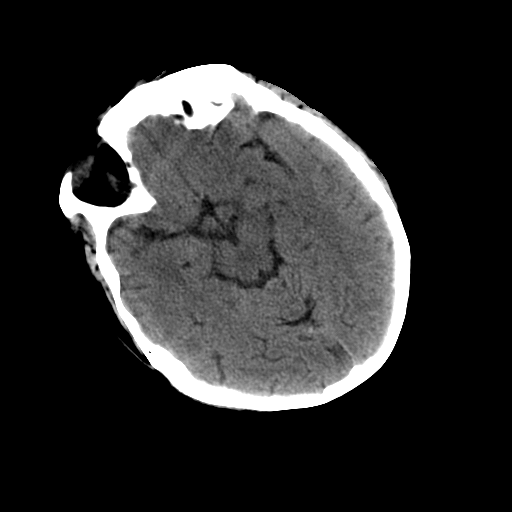
[im 10/28  bone]
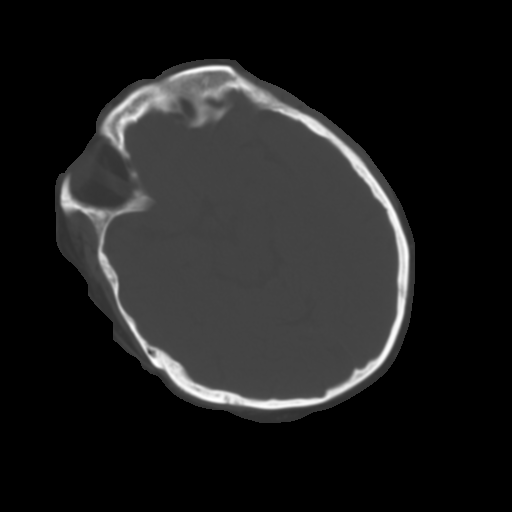
[im 12/28  brain]
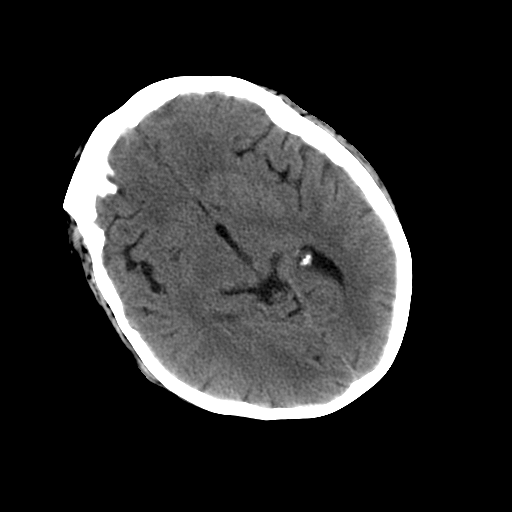
[im 14/28  brain]
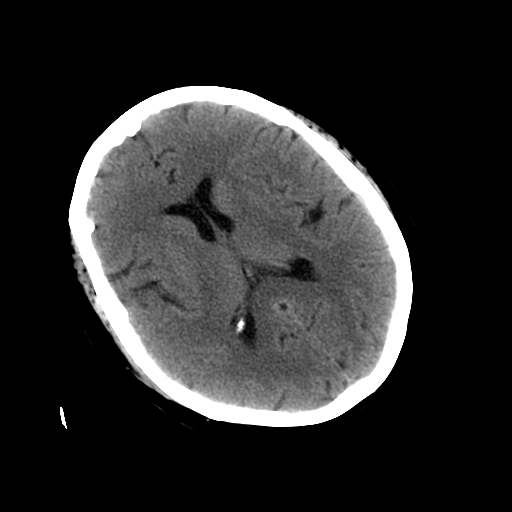
[im 16/28  brain]
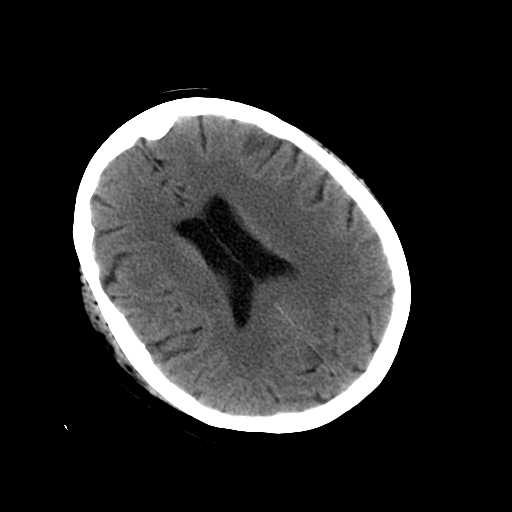
[im 18/28  brain]
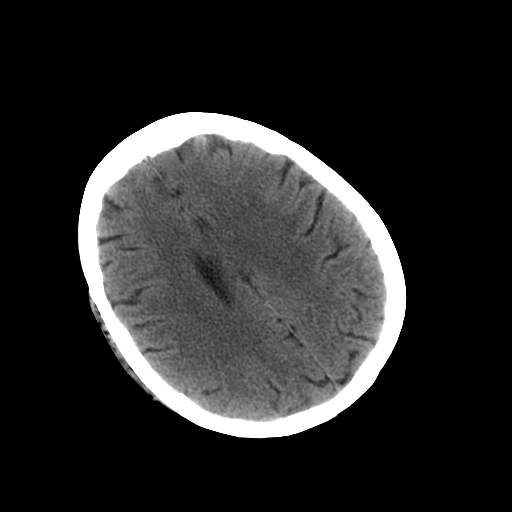
[im 18/28  bone]
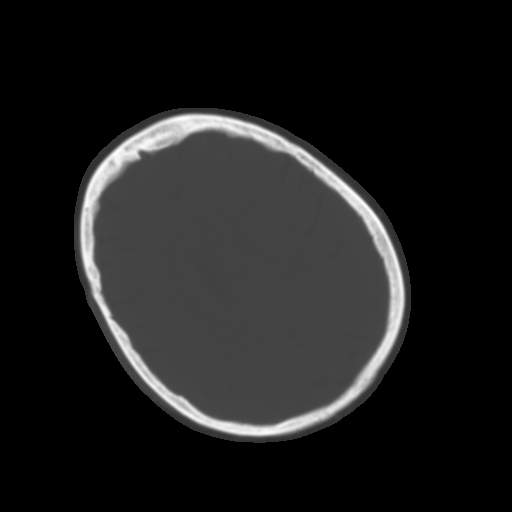
[im 20/28  brain]
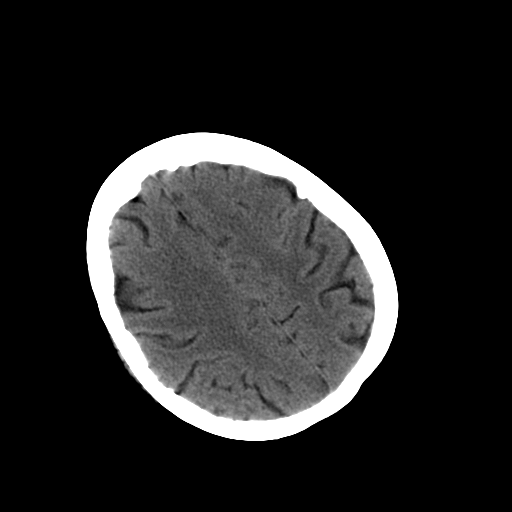
[im 22/28  brain]
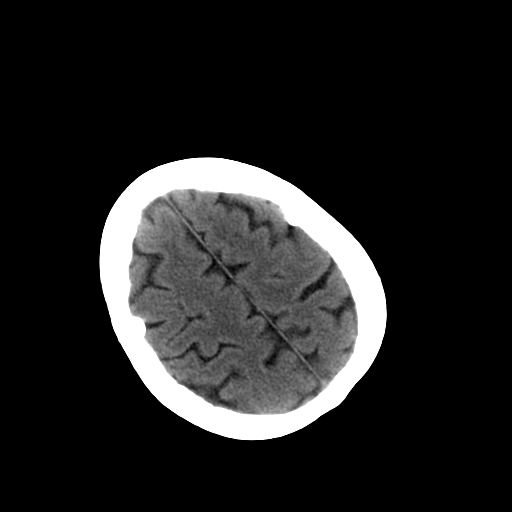
[im 24/28  brain]
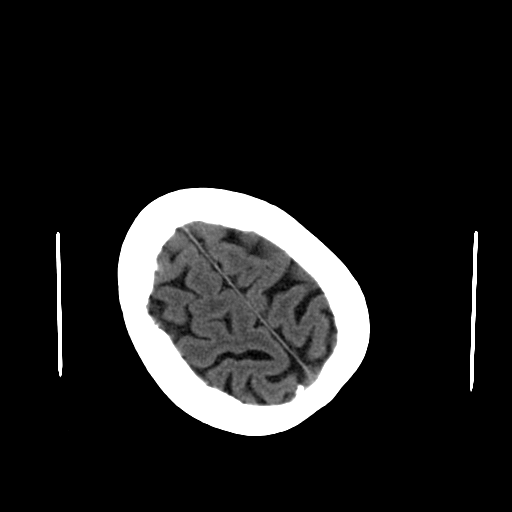
[im 26/28  brain]
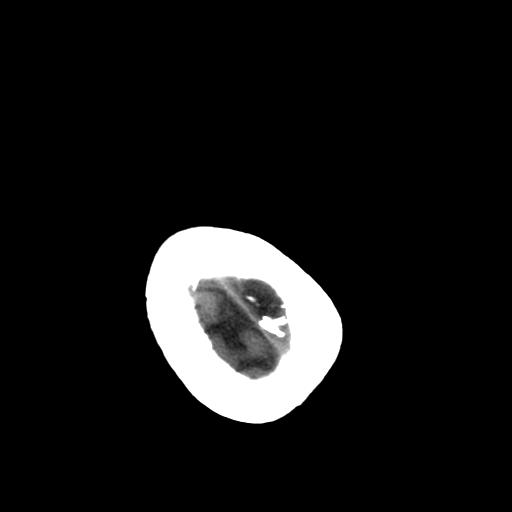
[im 26/28  bone]
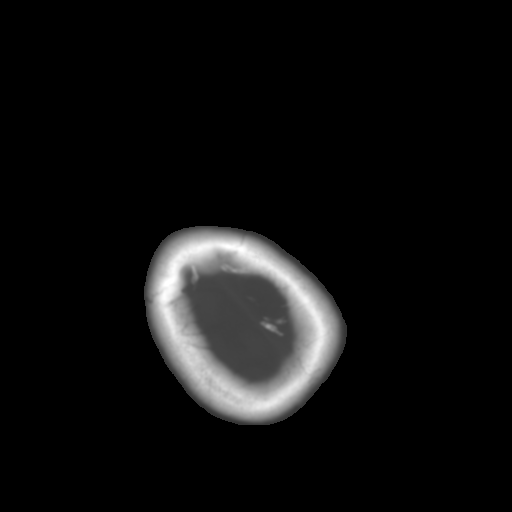

[13 of 30 positions shown; findings below may reference images not displayed]

FINDINGS: The patient?s head is tilted to the right.  There is no evidence of acute intracranial hemorrhage, mass effect or extraaxial fluid collection.  The ventricles and subarachnoid spaces are appropriately sized for age.  There is no CT evidence of acute stroke.  The visualized paranasal sinuses are clear.  Calvarium is intact.
IMPRESSION: Stable examination without CT evidence of acute stroke.  If that remains a clinical concern, MRI may be helpful for further evaluation.

## 2007-11-05 IMAGING — CR DG KNEE 1-2V PORT*R*
2 series · 2 of 2 positions shown · non-contrast
Comparison: none

CLINICAL DATA: Right knee popping sensation.

RIGHT KNEE - 2 VIEW

[view not recorded (1 of 2)]
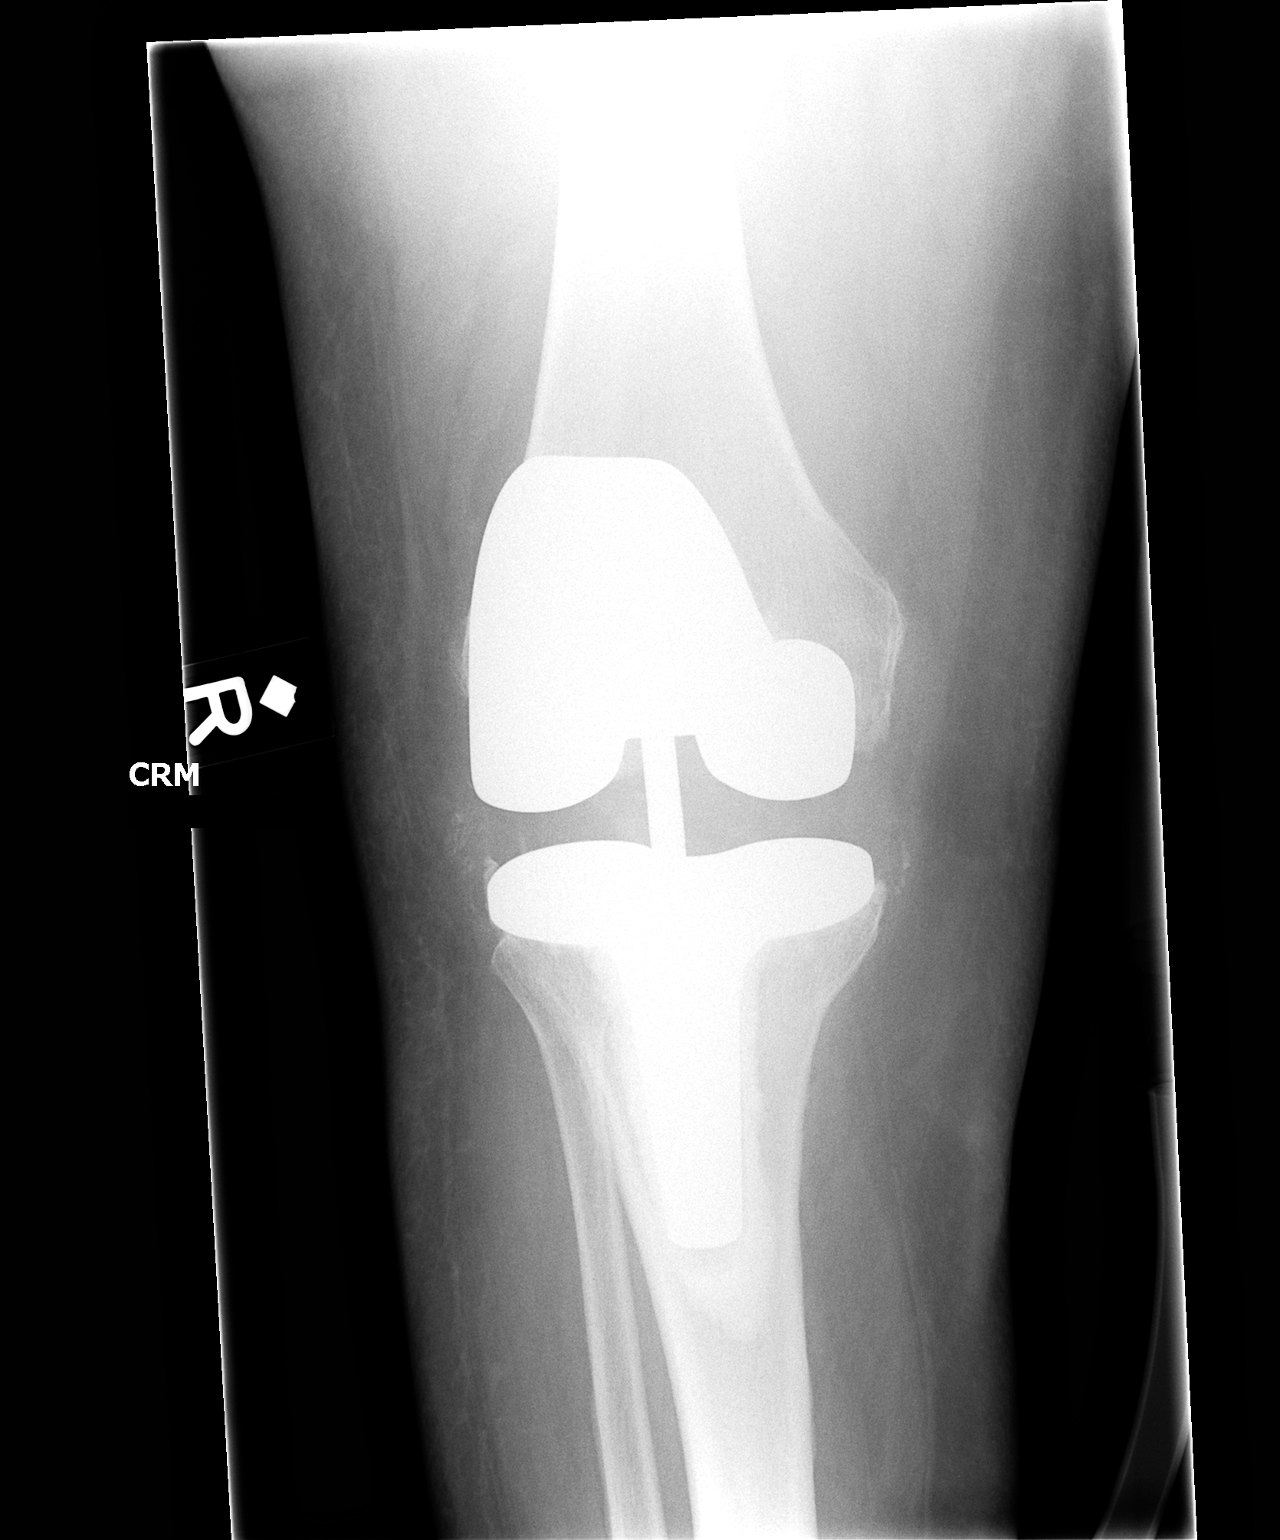

[view not recorded (2 of 2)]
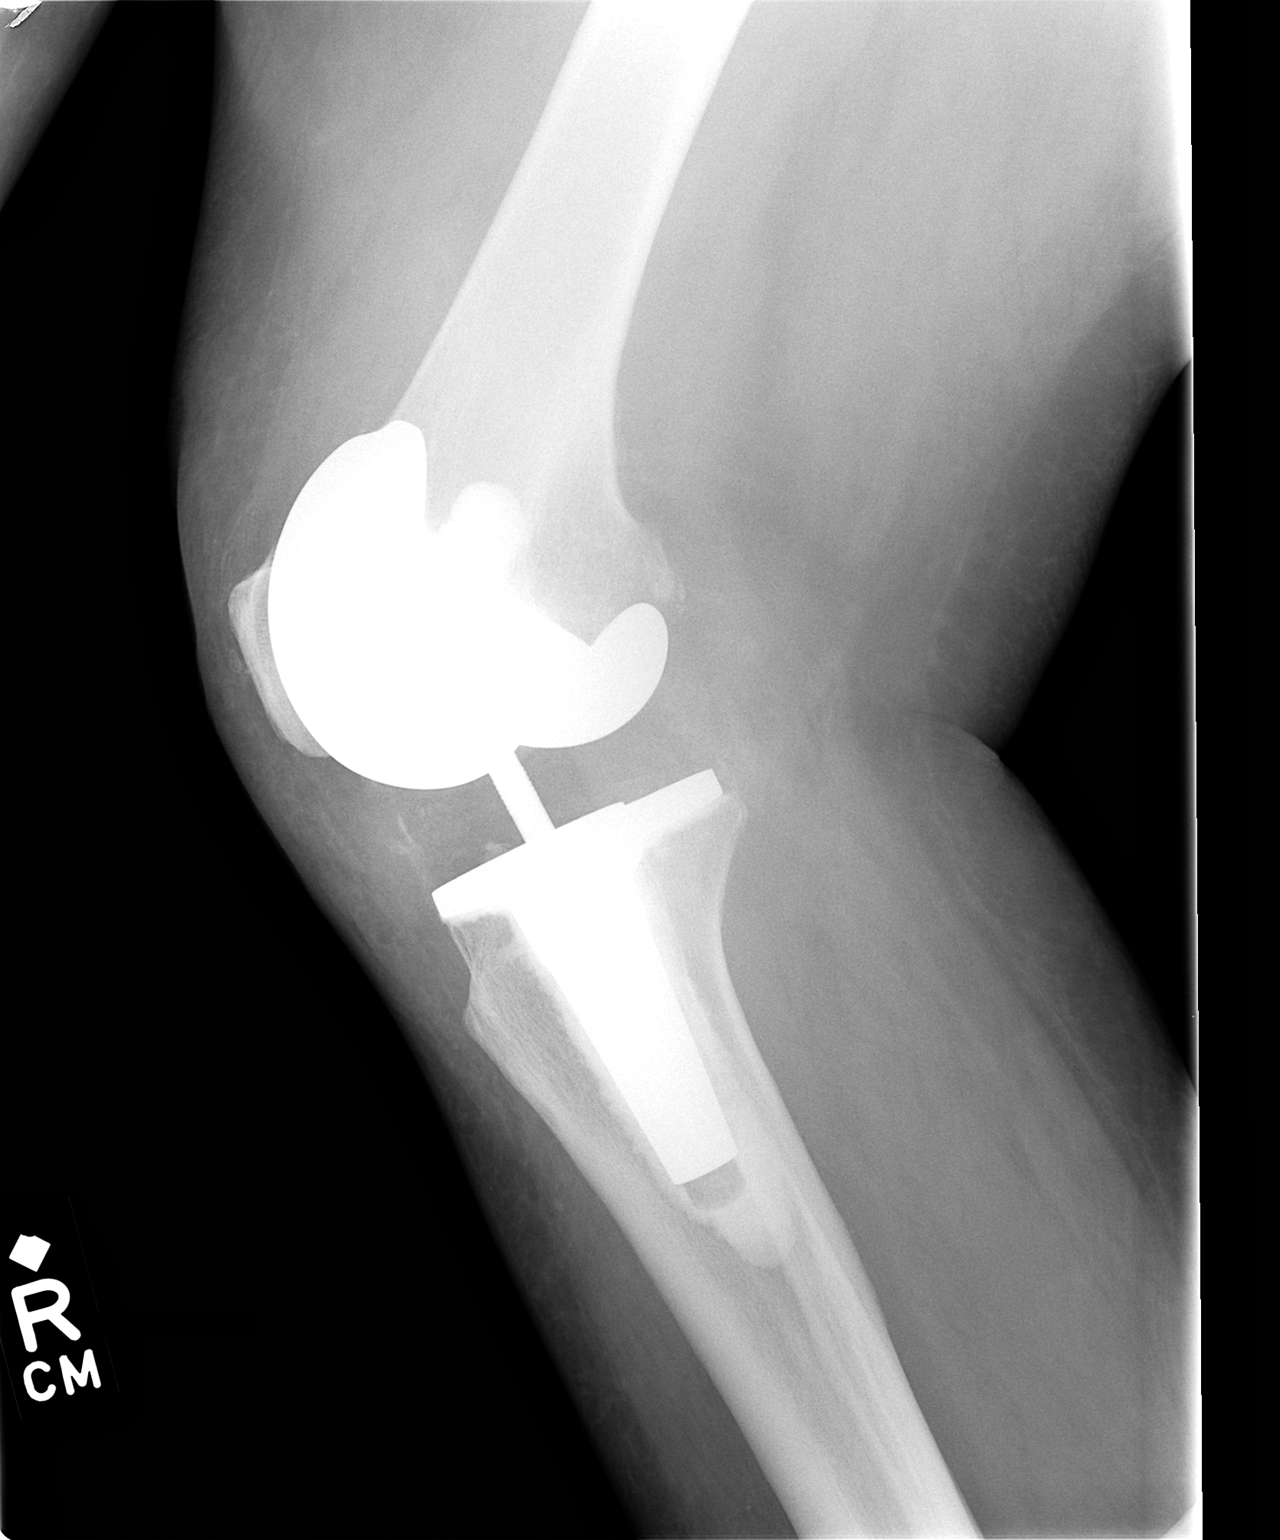

[2 of 2 positions shown; findings below may reference images not displayed]

FINDINGS: Portable views of the right knee demonstrate total knee prosthesis in
place. Overall the prosthesis appears satisfactorily positioned, and without
findings of loosening or infection.

IMPRESSION

1. No findings of loosening or infection associated with the knee prosthesis.

## 2007-11-23 ENCOUNTER — Encounter (INDEPENDENT_AMBULATORY_CARE_PROVIDER_SITE_OTHER): Payer: Self-pay | Admitting: Family Medicine

## 2007-12-29 ENCOUNTER — Encounter: Payer: Self-pay | Admitting: *Deleted

## 2008-01-22 ENCOUNTER — Telehealth (INDEPENDENT_AMBULATORY_CARE_PROVIDER_SITE_OTHER): Payer: Self-pay | Admitting: *Deleted

## 2008-01-24 ENCOUNTER — Encounter: Payer: Self-pay | Admitting: *Deleted

## 2008-01-26 ENCOUNTER — Telehealth: Payer: Self-pay | Admitting: *Deleted

## 2008-02-28 ENCOUNTER — Encounter (INDEPENDENT_AMBULATORY_CARE_PROVIDER_SITE_OTHER): Payer: Self-pay | Admitting: Family Medicine

## 2008-02-28 ENCOUNTER — Ambulatory Visit: Payer: Self-pay | Admitting: Family Medicine

## 2008-02-28 LAB — CONVERTED CEMR LAB
Hgb A1c MFr Bld: 6.9 %
LDL Cholesterol: 96 mg/dL

## 2008-02-29 LAB — CONVERTED CEMR LAB
BUN: 20 mg/dL (ref 6–23)
CO2: 25 meq/L (ref 19–32)
Calcium: 9.7 mg/dL (ref 8.4–10.5)
Chloride: 101 meq/L (ref 96–112)
Creatinine, Ser: 1.2 mg/dL (ref 0.40–1.20)
Direct LDL: 96 mg/dL
Glucose, Bld: 148 mg/dL — ABNORMAL HIGH (ref 70–99)
Potassium: 3.3 meq/L — ABNORMAL LOW (ref 3.5–5.3)
Sodium: 143 meq/L (ref 135–145)
TSH: 4.134 microintl units/mL (ref 0.350–4.50)

## 2008-04-26 ENCOUNTER — Telehealth (INDEPENDENT_AMBULATORY_CARE_PROVIDER_SITE_OTHER): Payer: Self-pay | Admitting: Family Medicine

## 2008-04-30 ENCOUNTER — Encounter: Payer: Self-pay | Admitting: *Deleted

## 2008-05-21 ENCOUNTER — Telehealth (INDEPENDENT_AMBULATORY_CARE_PROVIDER_SITE_OTHER): Payer: Self-pay | Admitting: Family Medicine

## 2008-05-31 ENCOUNTER — Encounter (INDEPENDENT_AMBULATORY_CARE_PROVIDER_SITE_OTHER): Payer: Self-pay | Admitting: Family Medicine

## 2008-07-04 ENCOUNTER — Ambulatory Visit: Payer: Self-pay | Admitting: Family Medicine

## 2008-07-04 ENCOUNTER — Encounter (INDEPENDENT_AMBULATORY_CARE_PROVIDER_SITE_OTHER): Payer: Self-pay | Admitting: Family Medicine

## 2008-07-04 LAB — CONVERTED CEMR LAB: Hgb A1c MFr Bld: 7.2 %

## 2008-07-05 LAB — CONVERTED CEMR LAB
BUN: 38 mg/dL — ABNORMAL HIGH (ref 6–23)
CO2: 27 meq/L (ref 19–32)
Calcium: 10.5 mg/dL (ref 8.4–10.5)
Chloride: 94 meq/L — ABNORMAL LOW (ref 96–112)
Creatinine, Ser: 1.27 mg/dL — ABNORMAL HIGH (ref 0.40–1.20)
Glucose, Bld: 421 mg/dL — ABNORMAL HIGH (ref 70–99)
Potassium: 3.3 meq/L — ABNORMAL LOW (ref 3.5–5.3)
Sodium: 139 meq/L (ref 135–145)

## 2008-07-18 ENCOUNTER — Encounter: Admission: RE | Admit: 2008-07-18 | Discharge: 2008-07-18 | Payer: Self-pay | Admitting: Family Medicine

## 2008-07-31 ENCOUNTER — Telehealth (INDEPENDENT_AMBULATORY_CARE_PROVIDER_SITE_OTHER): Payer: Self-pay | Admitting: Family Medicine

## 2008-08-05 ENCOUNTER — Encounter (INDEPENDENT_AMBULATORY_CARE_PROVIDER_SITE_OTHER): Payer: Self-pay | Admitting: Family Medicine

## 2008-08-07 ENCOUNTER — Telehealth: Payer: Self-pay | Admitting: *Deleted

## 2008-09-11 ENCOUNTER — Emergency Department (HOSPITAL_COMMUNITY): Admission: EM | Admit: 2008-09-11 | Discharge: 2008-09-11 | Payer: Self-pay | Admitting: Emergency Medicine

## 2008-10-08 ENCOUNTER — Ambulatory Visit: Payer: Self-pay | Admitting: Family Medicine

## 2008-10-08 ENCOUNTER — Telehealth (INDEPENDENT_AMBULATORY_CARE_PROVIDER_SITE_OTHER): Payer: Self-pay | Admitting: Family Medicine

## 2008-10-23 ENCOUNTER — Ambulatory Visit: Payer: Self-pay | Admitting: Family Medicine

## 2008-10-23 LAB — CONVERTED CEMR LAB: Hgb A1c MFr Bld: 10.9 %

## 2008-10-25 ENCOUNTER — Encounter (INDEPENDENT_AMBULATORY_CARE_PROVIDER_SITE_OTHER): Payer: Self-pay | Admitting: Family Medicine

## 2008-11-17 ENCOUNTER — Ambulatory Visit: Payer: Self-pay | Admitting: Family Medicine

## 2008-11-17 ENCOUNTER — Encounter: Payer: Self-pay | Admitting: Family Medicine

## 2008-11-17 ENCOUNTER — Inpatient Hospital Stay (HOSPITAL_COMMUNITY): Admission: EM | Admit: 2008-11-17 | Discharge: 2008-11-21 | Payer: Self-pay | Admitting: Emergency Medicine

## 2008-11-17 ENCOUNTER — Ambulatory Visit: Payer: Self-pay | Admitting: Cardiology

## 2008-11-18 ENCOUNTER — Encounter: Payer: Self-pay | Admitting: Family Medicine

## 2008-11-22 ENCOUNTER — Ambulatory Visit: Payer: Self-pay | Admitting: Family Medicine

## 2008-11-22 LAB — CONVERTED CEMR LAB: INR: 2

## 2008-11-25 ENCOUNTER — Ambulatory Visit: Payer: Self-pay | Admitting: Family Medicine

## 2008-11-25 LAB — CONVERTED CEMR LAB: INR: 1.6

## 2008-11-27 ENCOUNTER — Telehealth: Payer: Self-pay | Admitting: *Deleted

## 2008-11-29 ENCOUNTER — Ambulatory Visit: Payer: Self-pay | Admitting: Family Medicine

## 2008-11-29 ENCOUNTER — Inpatient Hospital Stay (HOSPITAL_COMMUNITY): Admission: EM | Admit: 2008-11-29 | Discharge: 2008-12-05 | Payer: Self-pay | Admitting: Emergency Medicine

## 2008-11-29 ENCOUNTER — Encounter: Payer: Self-pay | Admitting: Family Medicine

## 2008-11-29 ENCOUNTER — Ambulatory Visit: Payer: Self-pay | Admitting: Pulmonary Disease

## 2008-11-29 LAB — CONVERTED CEMR LAB: INR: 3

## 2008-12-02 ENCOUNTER — Encounter: Payer: Self-pay | Admitting: Internal Medicine

## 2008-12-11 ENCOUNTER — Encounter (INDEPENDENT_AMBULATORY_CARE_PROVIDER_SITE_OTHER): Payer: Self-pay | Admitting: Family Medicine

## 2008-12-11 ENCOUNTER — Ambulatory Visit: Payer: Self-pay | Admitting: Family Medicine

## 2008-12-11 DIAGNOSIS — G4730 Sleep apnea, unspecified: Secondary | ICD-10-CM | POA: Insufficient documentation

## 2008-12-11 LAB — CONVERTED CEMR LAB
BUN: 21 mg/dL (ref 6–23)
CO2: 26 meq/L (ref 19–32)
Calcium: 9.1 mg/dL (ref 8.4–10.5)
Chloride: 97 meq/L (ref 96–112)
Creatinine, Ser: 1.02 mg/dL (ref 0.40–1.20)
Glucose, Bld: 350 mg/dL — ABNORMAL HIGH (ref 70–99)
INR: 2
Potassium: 3.2 meq/L — ABNORMAL LOW (ref 3.5–5.3)
Sodium: 138 meq/L (ref 135–145)

## 2008-12-12 ENCOUNTER — Encounter (INDEPENDENT_AMBULATORY_CARE_PROVIDER_SITE_OTHER): Payer: Self-pay | Admitting: Family Medicine

## 2008-12-19 ENCOUNTER — Inpatient Hospital Stay (HOSPITAL_COMMUNITY): Admission: EM | Admit: 2008-12-19 | Discharge: 2008-12-24 | Payer: Self-pay | Admitting: Family Medicine

## 2008-12-19 ENCOUNTER — Ambulatory Visit: Payer: Self-pay | Admitting: Cardiology

## 2008-12-19 ENCOUNTER — Encounter: Payer: Self-pay | Admitting: Emergency Medicine

## 2008-12-19 ENCOUNTER — Ambulatory Visit: Payer: Self-pay | Admitting: Family Medicine

## 2008-12-19 ENCOUNTER — Encounter: Payer: Self-pay | Admitting: Family Medicine

## 2008-12-20 ENCOUNTER — Encounter: Payer: Self-pay | Admitting: Cardiology

## 2008-12-20 ENCOUNTER — Encounter (INDEPENDENT_AMBULATORY_CARE_PROVIDER_SITE_OTHER): Payer: Self-pay | Admitting: Family Medicine

## 2008-12-21 ENCOUNTER — Encounter: Payer: Self-pay | Admitting: Cardiology

## 2008-12-24 ENCOUNTER — Encounter: Payer: Self-pay | Admitting: Family Medicine

## 2008-12-27 ENCOUNTER — Encounter (INDEPENDENT_AMBULATORY_CARE_PROVIDER_SITE_OTHER): Payer: Self-pay | Admitting: Family Medicine

## 2008-12-27 ENCOUNTER — Ambulatory Visit: Payer: Self-pay | Admitting: Family Medicine

## 2008-12-27 LAB — CONVERTED CEMR LAB
BUN: 24 mg/dL — ABNORMAL HIGH (ref 6–23)
CO2: 25 meq/L (ref 19–32)
Calcium: 8.6 mg/dL (ref 8.4–10.5)
Chloride: 99 meq/L (ref 96–112)
Creatinine, Ser: 1.12 mg/dL (ref 0.40–1.20)
Glucose, Bld: 355 mg/dL — ABNORMAL HIGH (ref 70–99)
INR: 1.1
Potassium: 3.1 meq/L — ABNORMAL LOW (ref 3.5–5.3)
Sodium: 140 meq/L (ref 135–145)

## 2008-12-31 ENCOUNTER — Encounter: Payer: Self-pay | Admitting: Family Medicine

## 2008-12-31 ENCOUNTER — Encounter (INDEPENDENT_AMBULATORY_CARE_PROVIDER_SITE_OTHER): Payer: Self-pay | Admitting: Family Medicine

## 2008-12-31 ENCOUNTER — Ambulatory Visit: Payer: Self-pay | Admitting: Family Medicine

## 2008-12-31 LAB — CONVERTED CEMR LAB
BUN: 17 mg/dL (ref 6–23)
CO2: 30 meq/L (ref 19–32)
Calcium: 9.7 mg/dL (ref 8.4–10.5)
Chloride: 97 meq/L (ref 96–112)
Creatinine, Ser: 0.87 mg/dL (ref 0.40–1.20)
Glucose, Bld: 198 mg/dL — ABNORMAL HIGH (ref 70–99)
Potassium: 3.4 meq/L — ABNORMAL LOW (ref 3.5–5.3)
Sodium: 142 meq/L (ref 135–145)

## 2009-01-02 ENCOUNTER — Encounter: Admission: RE | Admit: 2009-01-02 | Discharge: 2009-01-02 | Payer: Self-pay | Admitting: Family Medicine

## 2009-01-02 ENCOUNTER — Ambulatory Visit: Payer: Self-pay | Admitting: Family Medicine

## 2009-01-02 LAB — CONVERTED CEMR LAB: INR: 1.9

## 2009-01-03 LAB — CONVERTED CEMR LAB: INR: 1.5

## 2009-01-06 ENCOUNTER — Encounter: Payer: Self-pay | Admitting: Nurse Practitioner

## 2009-01-06 ENCOUNTER — Ambulatory Visit: Payer: Self-pay | Admitting: Cardiovascular Disease

## 2009-01-08 ENCOUNTER — Encounter (INDEPENDENT_AMBULATORY_CARE_PROVIDER_SITE_OTHER): Payer: Self-pay | Admitting: Family Medicine

## 2009-01-10 ENCOUNTER — Ambulatory Visit: Payer: Self-pay | Admitting: Family Medicine

## 2009-01-10 LAB — CONVERTED CEMR LAB
Hgb A1c MFr Bld: 9 %
INR: 2.1

## 2009-02-07 ENCOUNTER — Ambulatory Visit: Payer: Self-pay | Admitting: Family Medicine

## 2009-02-07 LAB — CONVERTED CEMR LAB: INR: 1.9

## 2009-02-12 ENCOUNTER — Ambulatory Visit: Payer: Self-pay | Admitting: Family Medicine

## 2009-02-21 ENCOUNTER — Ambulatory Visit: Payer: Self-pay | Admitting: Family Medicine

## 2009-02-21 LAB — CONVERTED CEMR LAB: INR: 2.6

## 2009-03-07 ENCOUNTER — Ambulatory Visit: Payer: Self-pay | Admitting: Family Medicine

## 2009-03-07 LAB — CONVERTED CEMR LAB: INR: 2.4

## 2009-03-12 ENCOUNTER — Telehealth: Payer: Self-pay | Admitting: Family Medicine

## 2009-03-12 ENCOUNTER — Ambulatory Visit: Payer: Self-pay | Admitting: Family Medicine

## 2009-03-12 ENCOUNTER — Ambulatory Visit (HOSPITAL_COMMUNITY): Admission: RE | Admit: 2009-03-12 | Discharge: 2009-03-12 | Payer: Self-pay | Admitting: Family Medicine

## 2009-03-12 ENCOUNTER — Telehealth (INDEPENDENT_AMBULATORY_CARE_PROVIDER_SITE_OTHER): Payer: Self-pay | Admitting: *Deleted

## 2009-03-12 LAB — CONVERTED CEMR LAB: INR: 1.4

## 2009-03-24 ENCOUNTER — Telehealth: Payer: Self-pay | Admitting: Family Medicine

## 2009-03-25 ENCOUNTER — Encounter: Admission: RE | Admit: 2009-03-25 | Discharge: 2009-03-25 | Payer: Self-pay | Admitting: Family Medicine

## 2009-03-28 ENCOUNTER — Ambulatory Visit: Payer: Self-pay | Admitting: Family Medicine

## 2009-03-28 LAB — CONVERTED CEMR LAB: INR: 1.5

## 2009-04-01 ENCOUNTER — Encounter: Payer: Self-pay | Admitting: Family Medicine

## 2009-04-04 ENCOUNTER — Ambulatory Visit: Payer: Self-pay | Admitting: Family Medicine

## 2009-04-04 LAB — CONVERTED CEMR LAB: INR: 2.4

## 2009-04-14 ENCOUNTER — Encounter: Payer: Self-pay | Admitting: Family Medicine

## 2009-04-14 ENCOUNTER — Ambulatory Visit: Payer: Self-pay | Admitting: Family Medicine

## 2009-04-14 DIAGNOSIS — D649 Anemia, unspecified: Secondary | ICD-10-CM | POA: Insufficient documentation

## 2009-04-14 LAB — CONVERTED CEMR LAB
BUN: 37 mg/dL — ABNORMAL HIGH (ref 6–23)
CO2: 28 meq/L (ref 19–32)
Calcium: 9.5 mg/dL (ref 8.4–10.5)
Chloride: 96 meq/L (ref 96–112)
Creatinine, Ser: 1.02 mg/dL (ref 0.40–1.20)
Glucose, Bld: 233 mg/dL — ABNORMAL HIGH (ref 70–99)
HCT: 41.6 % (ref 36.0–46.0)
Hemoglobin: 13.8 g/dL (ref 12.0–15.0)
INR: 1.5
MCHC: 33.2 g/dL (ref 30.0–36.0)
MCV: 104 fL — ABNORMAL HIGH (ref 78.0–100.0)
Platelets: 183 10*3/uL (ref 150–400)
Potassium: 3.1 meq/L — ABNORMAL LOW (ref 3.5–5.3)
RBC: 4 M/uL (ref 3.87–5.11)
RDW: 16.1 % — ABNORMAL HIGH (ref 11.5–15.5)
Sodium: 140 meq/L (ref 135–145)
WBC: 10.1 10*3/uL (ref 4.0–10.5)

## 2009-04-16 ENCOUNTER — Telehealth: Payer: Self-pay | Admitting: Family Medicine

## 2009-04-22 ENCOUNTER — Encounter: Payer: Self-pay | Admitting: Family Medicine

## 2009-04-22 ENCOUNTER — Ambulatory Visit: Payer: Self-pay | Admitting: Family Medicine

## 2009-04-22 LAB — CONVERTED CEMR LAB: INR: 1

## 2009-04-25 ENCOUNTER — Ambulatory Visit: Payer: Self-pay | Admitting: Cardiology

## 2009-04-25 DIAGNOSIS — I5033 Acute on chronic diastolic (congestive) heart failure: Secondary | ICD-10-CM | POA: Insufficient documentation

## 2009-04-25 DIAGNOSIS — I5032 Chronic diastolic (congestive) heart failure: Secondary | ICD-10-CM

## 2009-04-29 ENCOUNTER — Ambulatory Visit: Payer: Self-pay | Admitting: Family Medicine

## 2009-04-29 LAB — CONVERTED CEMR LAB: INR: 3

## 2009-05-02 IMAGING — CR DG CHEST 1V PORT
1 series · 1 of 1 positions shown · non-contrast
Comparison: Chest x-ray of 05/17/2007

CLINICAL DATA: Chest pain, diabetes, hypertension, smoking history

PORTABLE CHEST - 1 VIEW

[view not recorded]
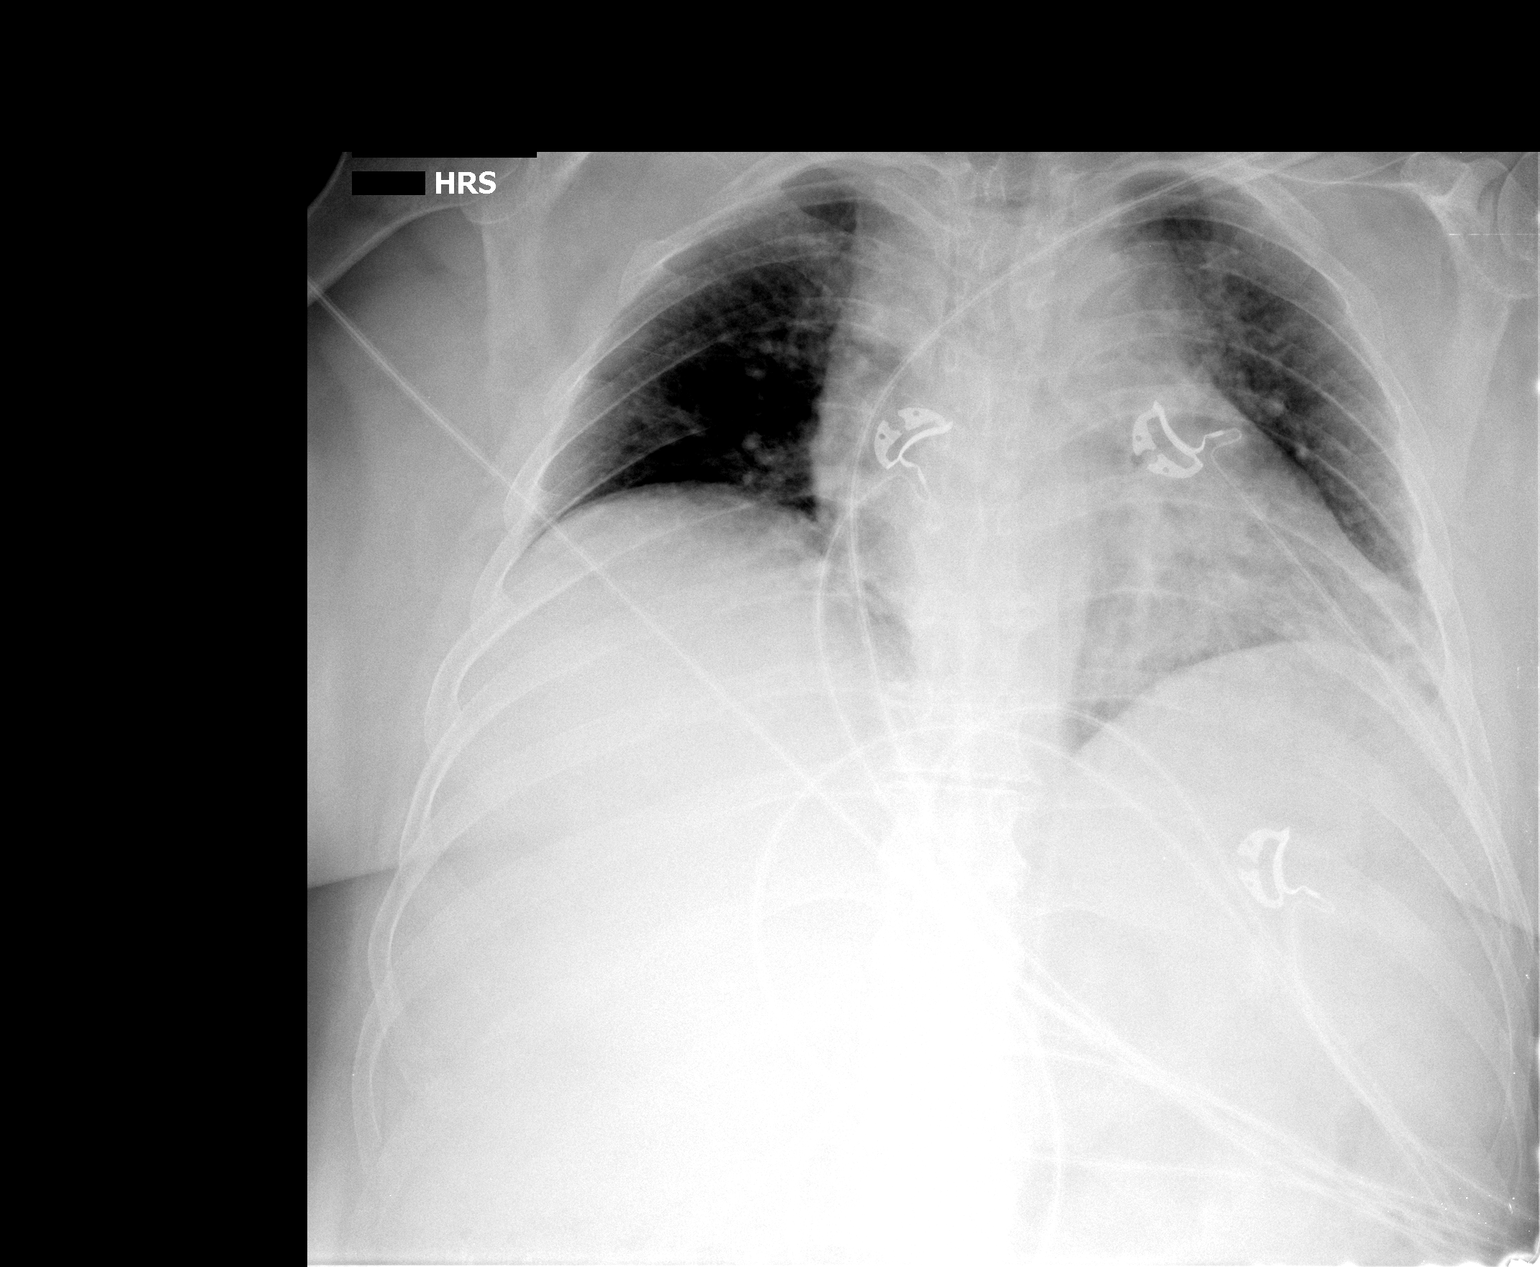

[1 of 1 positions shown; findings below may reference images not displayed]

FINDINGS: The lungs again are suboptimally aerated.  Prominent
markings remain particularly at the left lung base but these appear
stable and may represent scarring. Cardiomegaly is stable.  No
acute bony abnormality is seen.
IMPRESSION: Prominent markings particularly at the left lung base may well
reflect scarring as noted above.  Consider follow-up if warranted
clinically.  Stable cardiomegaly.

## 2009-05-03 IMAGING — CR DG CHEST 1V PORT
1 series · 1 of 1 positions shown · non-contrast
Comparison: Portable exam 5030 hours compared to 11/17/2008

CLINICAL DATA: Acute respiratory distress, pulmonary embolism

PORTABLE CHEST - 1 VIEW

[view not recorded]
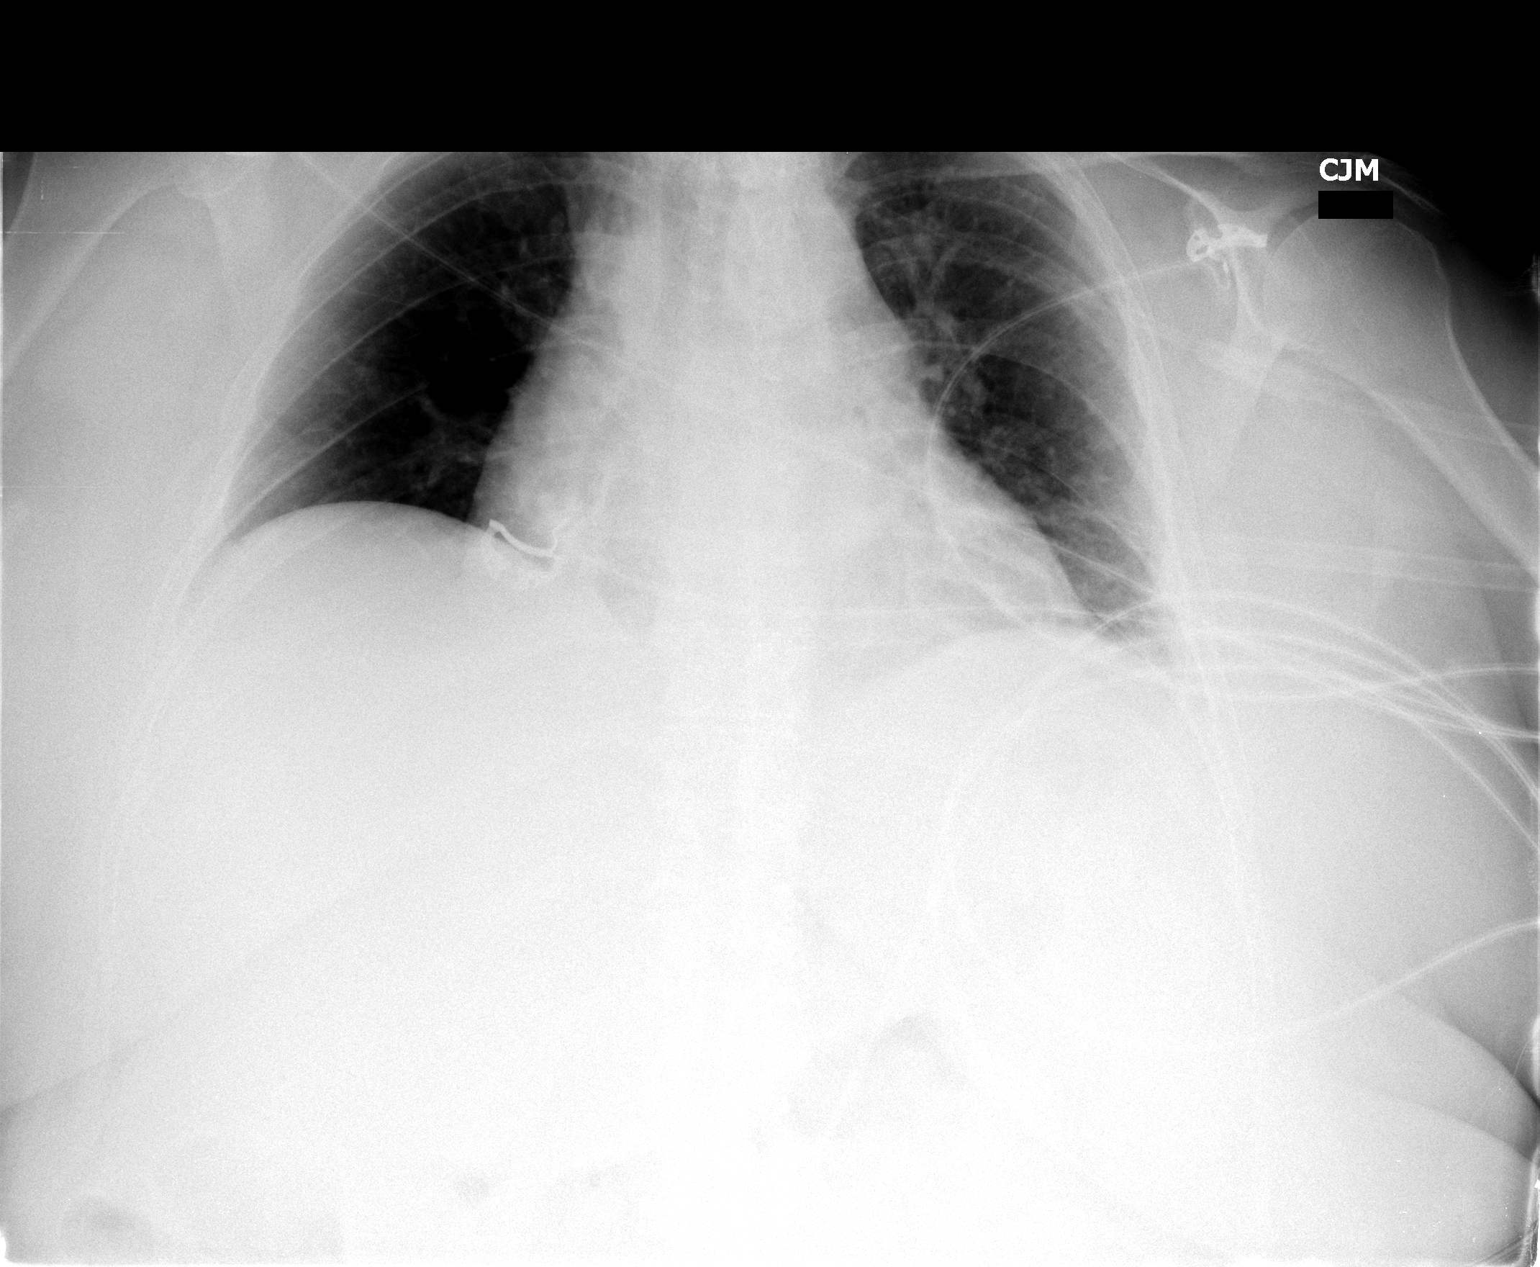

[1 of 1 positions shown; findings below may reference images not displayed]

FINDINGS: Low lung volumes.
Cardiac enlargement.
Normal mediastinal contours and pulmonary vascularity for degree of
hypoinflation.
No gross infiltrate or effusion.
Question minimal left basilar atelectasis.
No definite focal bony abnormality.
IMPRESSION: Low lung volumes and cardiac enlargement with question minimal
right basilar atelectasis.

## 2009-05-04 IMAGING — CT CT HEAD W/O CM
4 of 6 series · 17 of 30 positions shown, 19 images · non-contrast
Comparison: CT head 05/21/2007.

CLINICAL DATA: Altered mental status.  Lethargic.  History of
pulmonary emboli.

CT HEAD WITHOUT CONTRAST
TECHNIQUE: Contiguous axial images were obtained from the base of
the skull through the vertex without contrast.

[Series 4: — · axial · 0.49mm/px · z∈[+55,+145]mm · 4 of 32 slices shown (1 of 4)]
[im 7/32  brain]
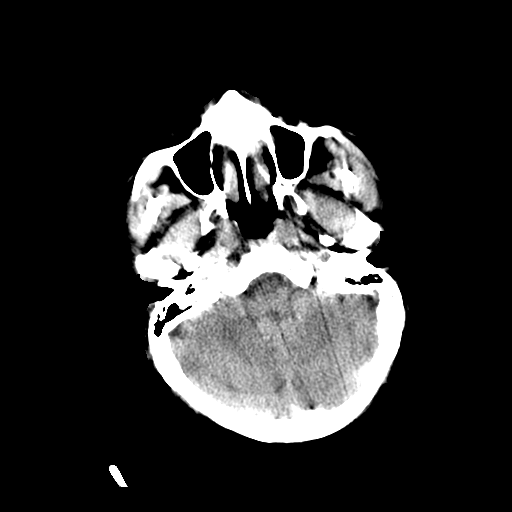
[im 13/32  brain]
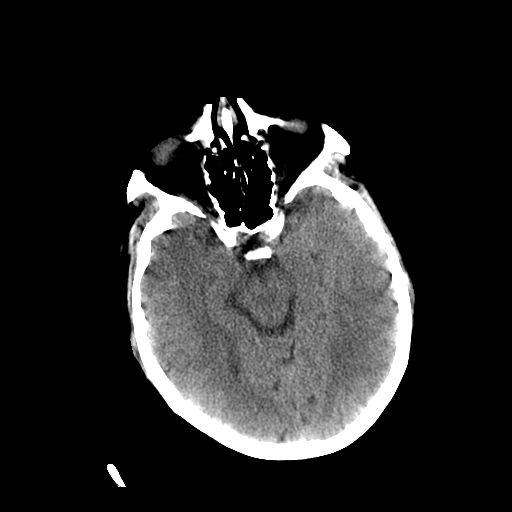
[im 19/32  brain]
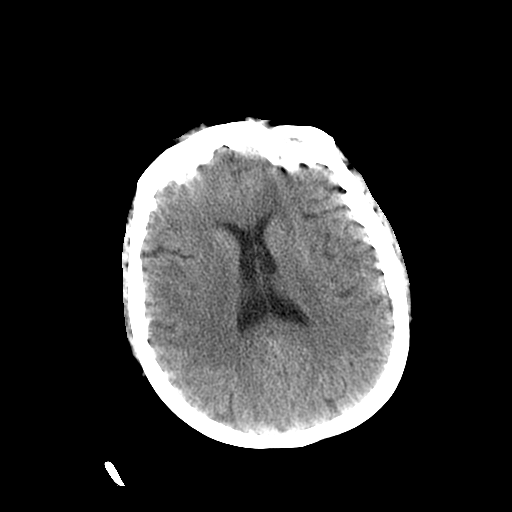
[im 25/32  brain]
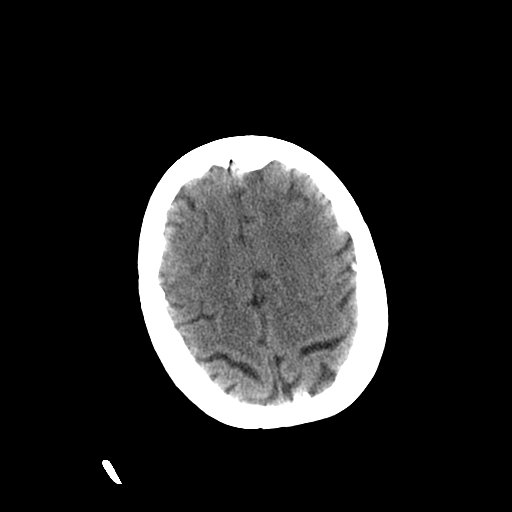

[Series 5: — · axial · 0.49mm/px · z∈[+55,+115]mm · 3 of 32 slices shown (2 of 4)]
[im 7/32  brain]
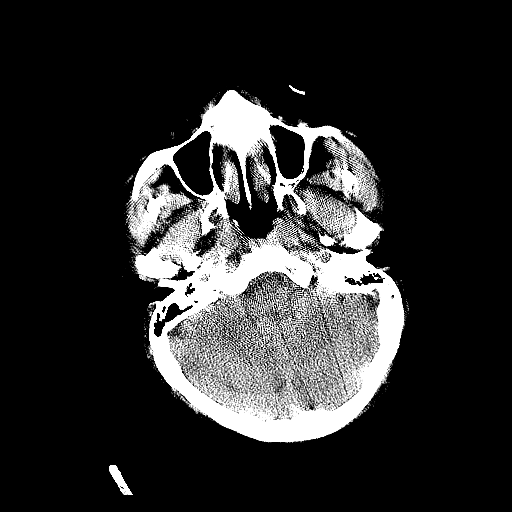
[im 13/32  brain]
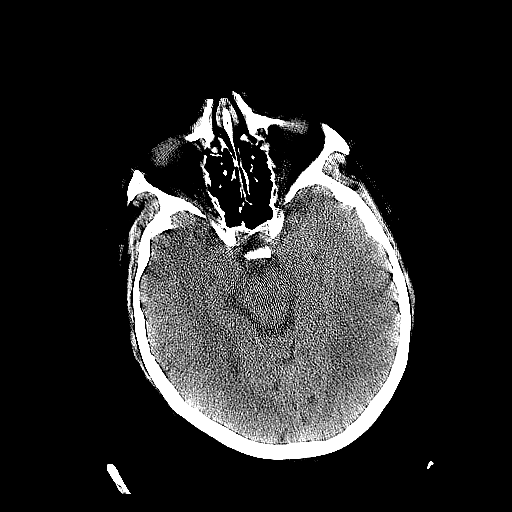
[im 19/32  brain]
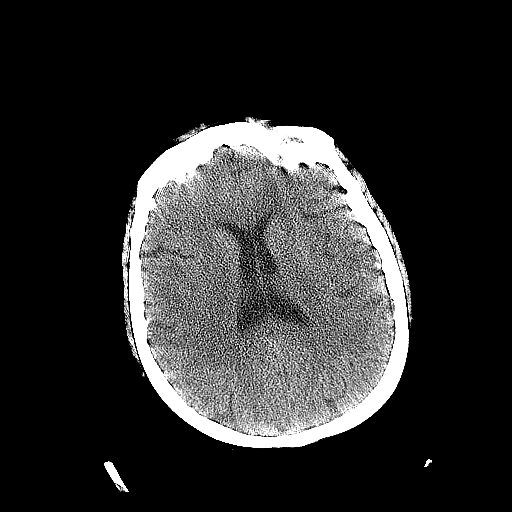

[Series 11: — · axial · 0.49mm/px · z∈[+22,+122]mm · 5 of 32 slices shown, 7 images (3 of 4)]
[im 6/32  brain]
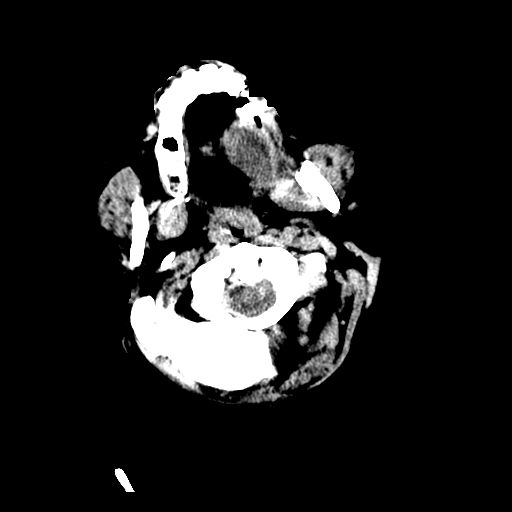
[im 6/32  bone]
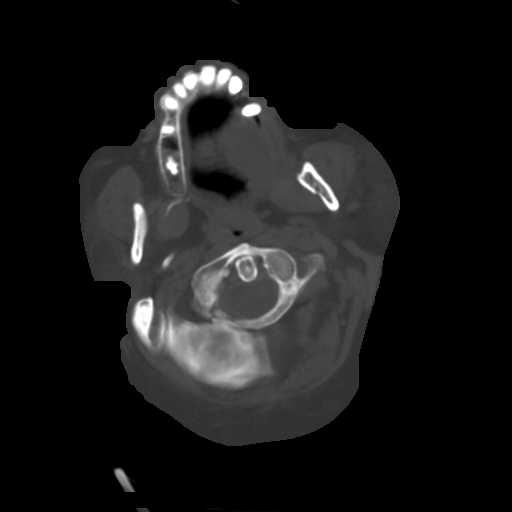
[im 11/32  brain]
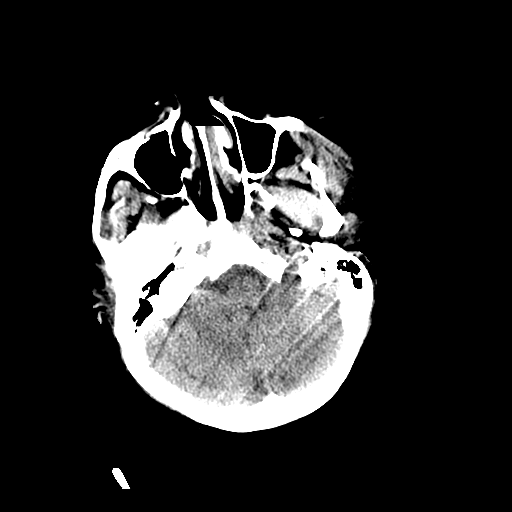
[im 16/32  brain]
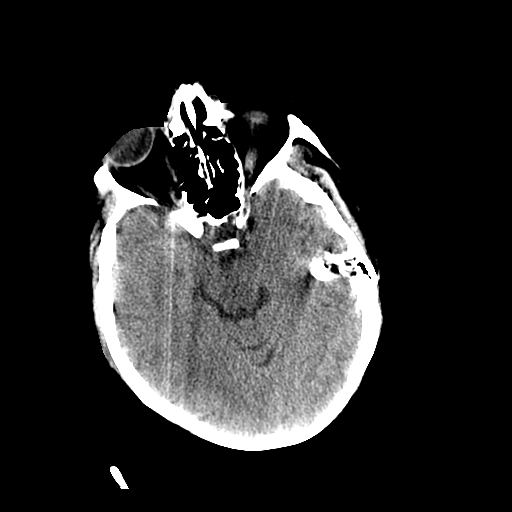
[im 21/32  brain]
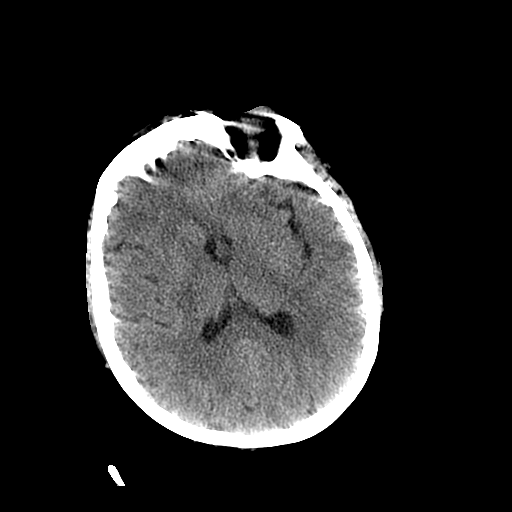
[im 26/32  brain]
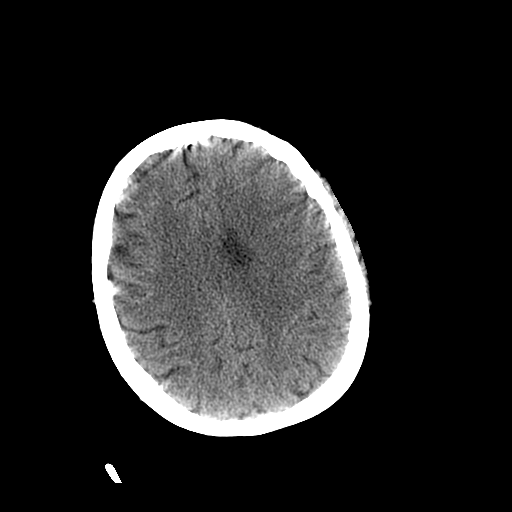
[im 26/32  bone]
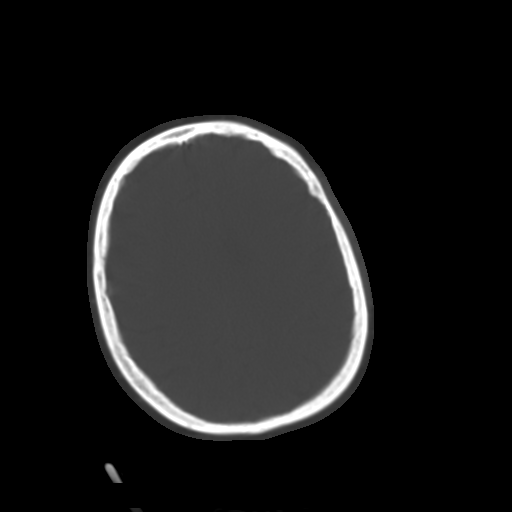

[Series 12: — · axial · 0.49mm/px · z∈[+22,+122]mm · 5 of 32 slices shown (4 of 4)]
[im 6/32  brain]
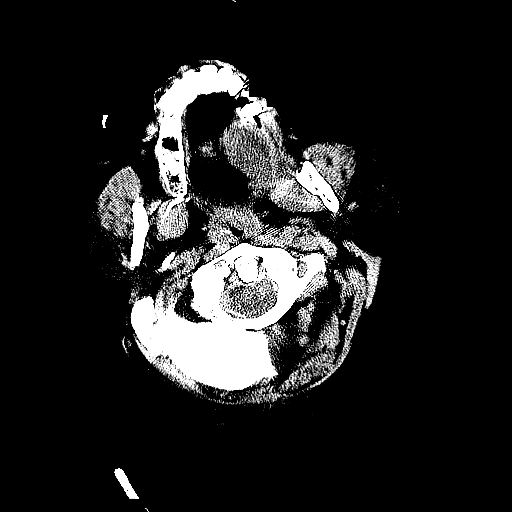
[im 11/32  brain]
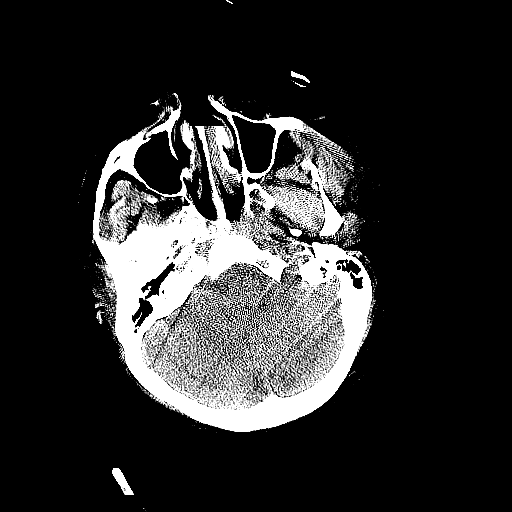
[im 16/32  brain]
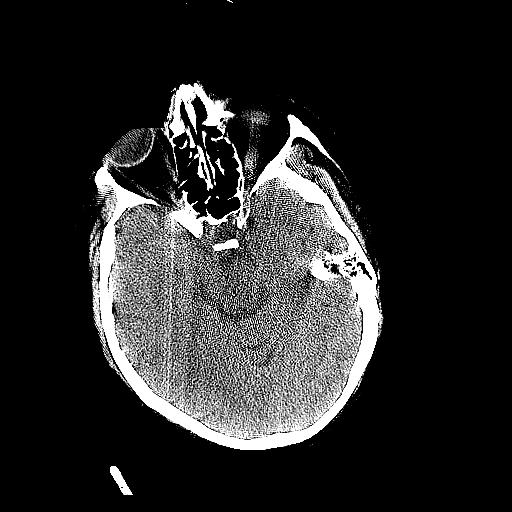
[im 21/32  brain]
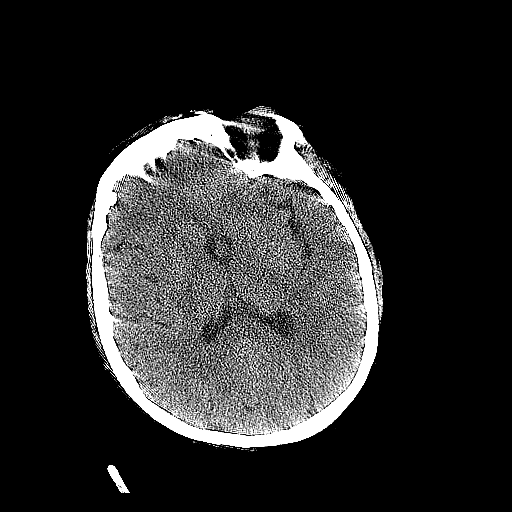
[im 26/32  brain]
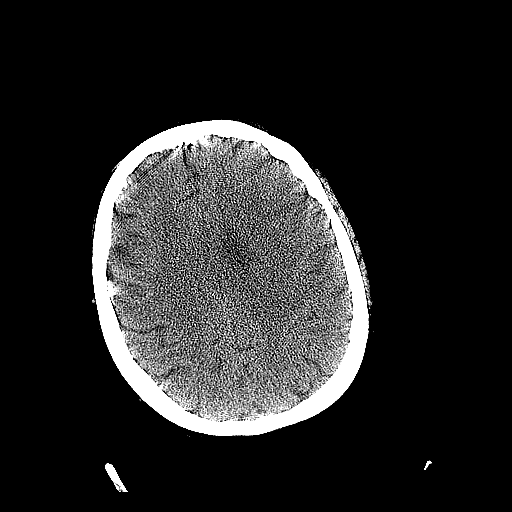

[17 of 30 positions shown; findings below may reference images not displayed]

FINDINGS: Mild motion degradation.  Multiple images were repeated.
Overall study is diagnostic.

There is no evidence for acute infarction, intracranial hemorrhage,
mass lesion, hydrocephalus, or extra-axial fluid.   There is mild
premature atrophy with suspected chronic microvascular ischemic
change.  Calvarium grossly intact.  Mild chronic sinus disease.
Findings are similar to prior study from 05/21/2007.
IMPRESSION: Mild atrophy and small vessel disease.  No definite acute
intracranial findings.  Overall sensitivity reduced due to patient
motion.

## 2009-05-06 ENCOUNTER — Ambulatory Visit: Payer: Self-pay | Admitting: Family Medicine

## 2009-05-06 ENCOUNTER — Encounter: Payer: Self-pay | Admitting: Family Medicine

## 2009-05-06 LAB — CONVERTED CEMR LAB
BUN: 25 mg/dL — ABNORMAL HIGH (ref 6–23)
CO2: 30 meq/L (ref 19–32)
Calcium: 9.2 mg/dL (ref 8.4–10.5)
Chloride: 94 meq/L — ABNORMAL LOW (ref 96–112)
Creatinine, Ser: 1 mg/dL (ref 0.40–1.20)
Glucose, Bld: 368 mg/dL — ABNORMAL HIGH (ref 70–99)
Hgb A1c MFr Bld: 10 %
INR: 3.4
Potassium: 3.2 meq/L — ABNORMAL LOW (ref 3.5–5.3)
Sodium: 138 meq/L (ref 135–145)

## 2009-05-14 ENCOUNTER — Telehealth: Payer: Self-pay | Admitting: Family Medicine

## 2009-05-14 IMAGING — CR DG CHEST 1V PORT
1 series · 1 of 1 positions shown · non-contrast
Comparison: 11/18/2008

CLINICAL DATA: Cough, low grade fever, asthma, difficulty
breathing.

PORTABLE CHEST - 1 VIEW

[view not recorded]
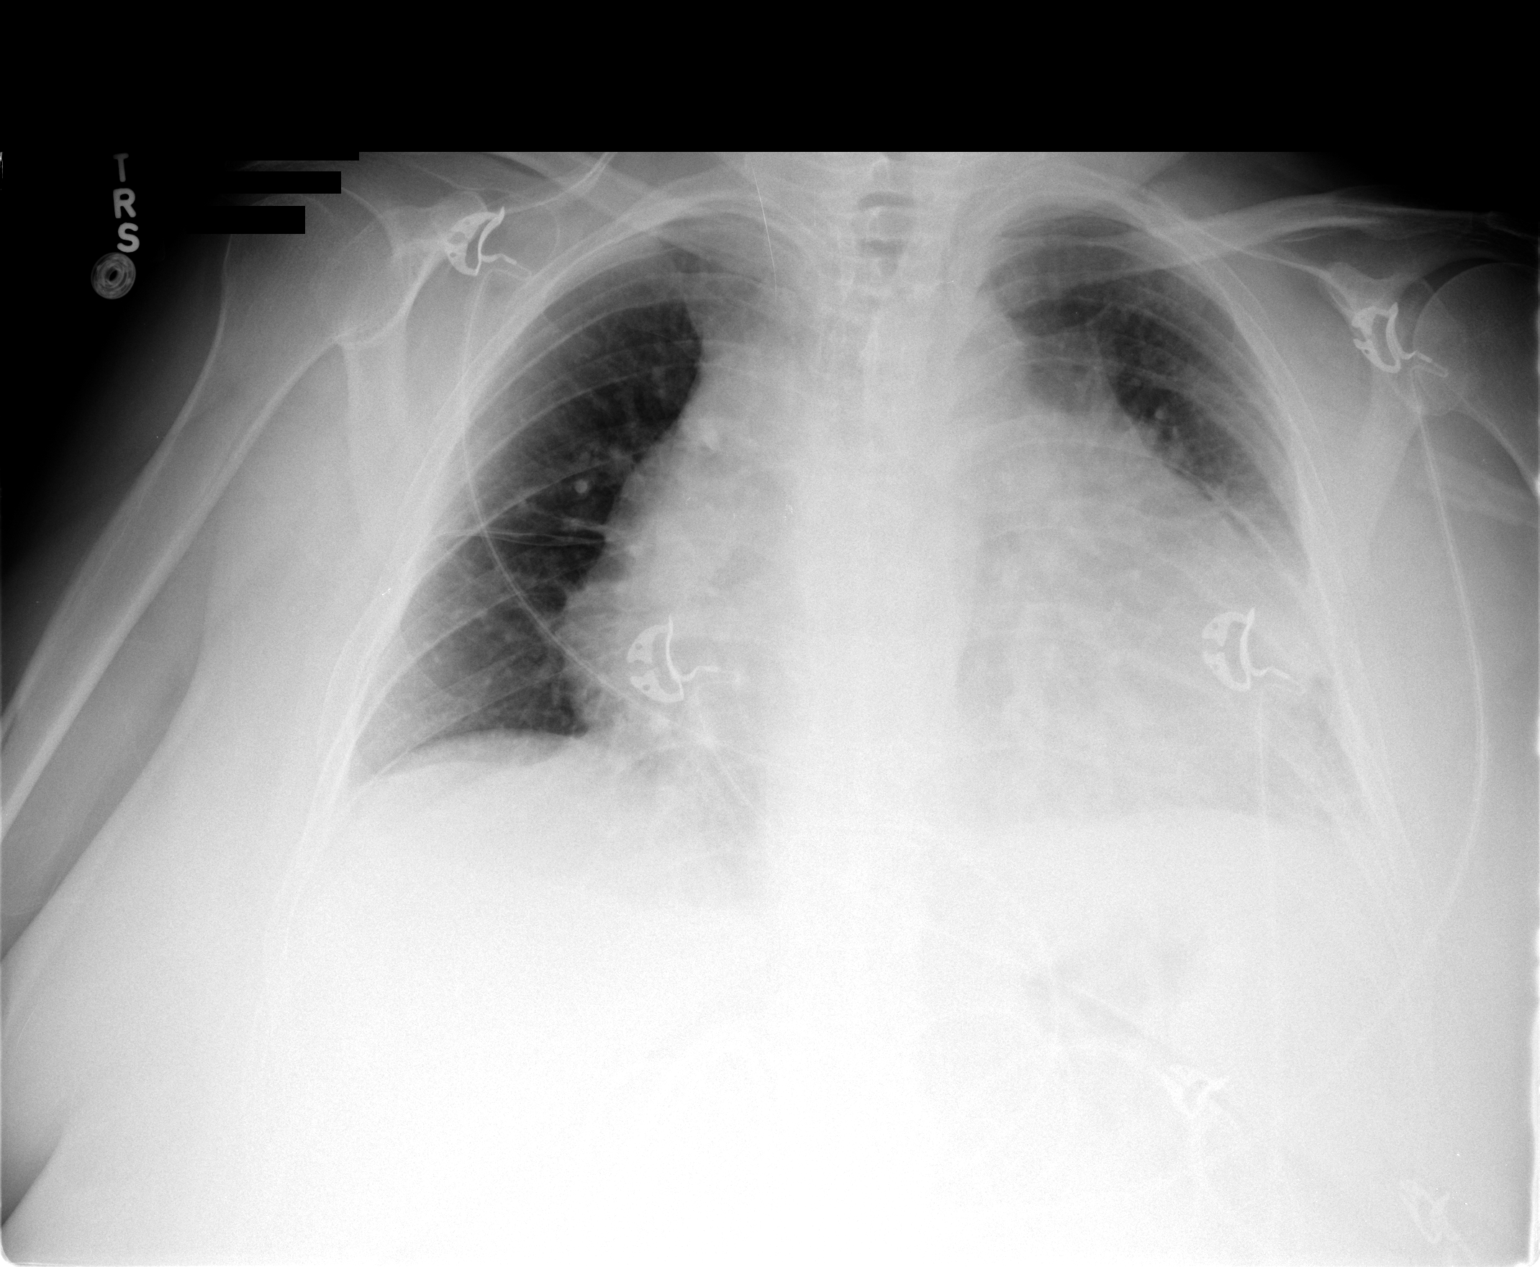

[1 of 1 positions shown; findings below may reference images not displayed]

FINDINGS: Very low lung volumes present.  There is cardiomegaly
with vascular congestion.  No definite focal opacity or effusion.
IMPRESSION: Very low lung volumes.  Cardiomegaly, vascular congestion.

## 2009-05-15 IMAGING — CR DG CHEST 1V PORT
1 series · 1 of 1 positions shown · non-contrast
Comparison: 11/29/2008

CLINICAL DATA: Dyspnea

PORTABLE CHEST - 1 VIEW

[AP]
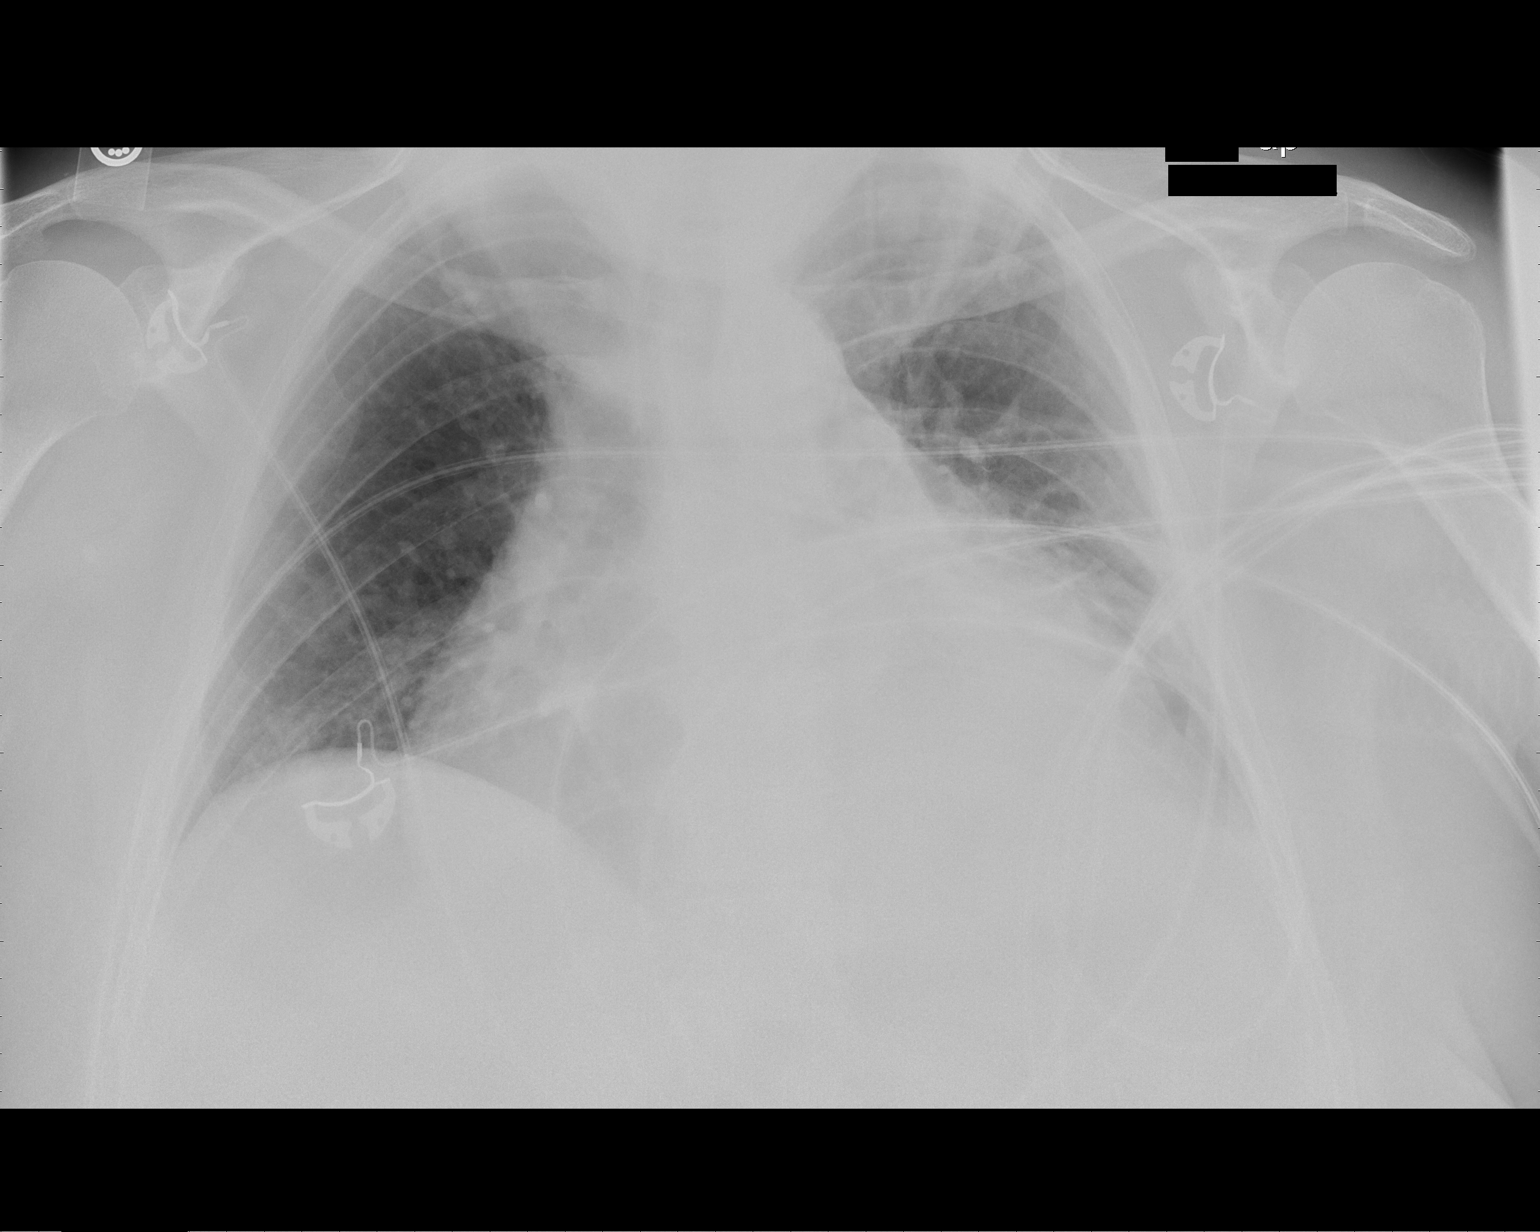

[1 of 1 positions shown; findings below may reference images not displayed]

FINDINGS: Heart is markedly enlarged.  Opacity at the left base is
likely volume loss.  Lungs are otherwise clear.  The are under
inflated.  No pneumothorax.
IMPRESSION: No significant change.  Probable volume loss at the left base
causing density.

## 2009-05-16 IMAGING — CR DG CHEST 1V PORT
1 series · 1 of 1 positions shown · non-contrast
Comparison: 11/30/2008

CLINICAL DATA: Respiratory distress.

PORTABLE CHEST - 1 VIEW

[AP]
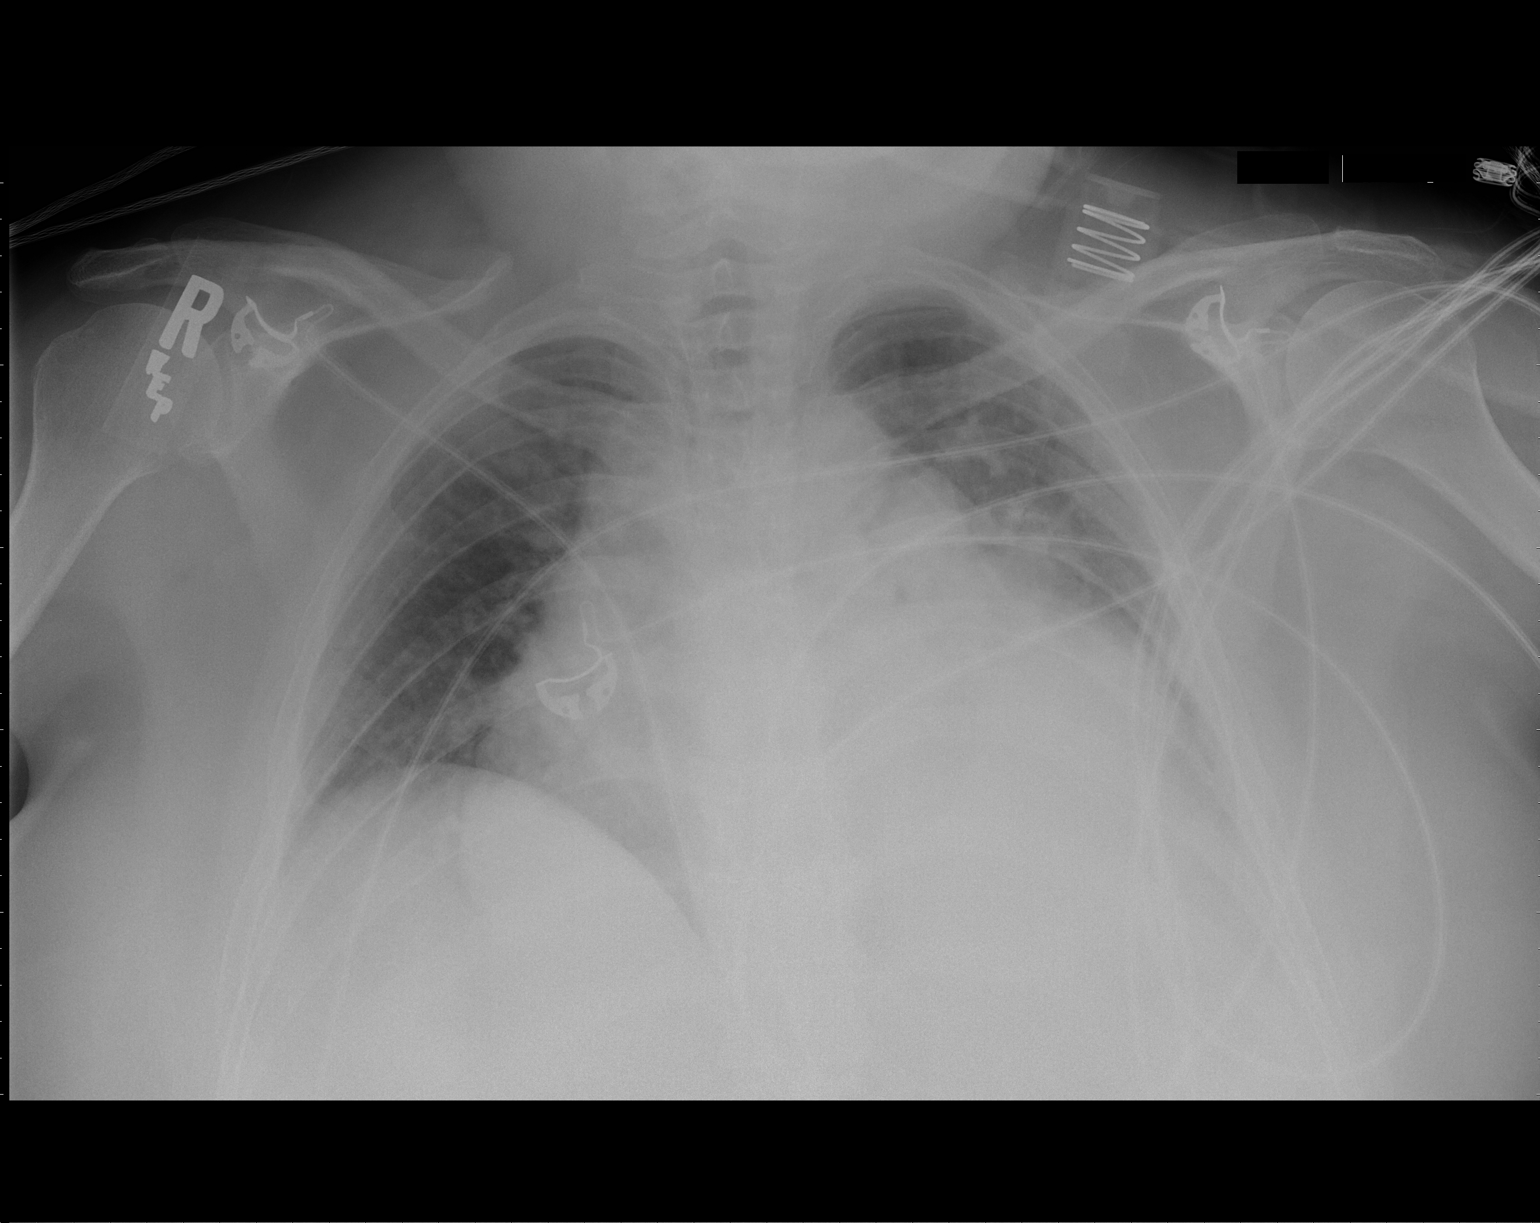

[1 of 1 positions shown; findings below may reference images not displayed]

FINDINGS: 8222 hours.  The lung volumes are very low. The
cardiopericardial silhouette is enlarged. There is pulmonary
vascular congestion without overt pulmonary edema. Opacity at the
left base is stable, likely reflecting retrocardiac atelectasis or
infiltrate.  Telemetry leads overlie the chest.
IMPRESSION: No substantial change.  Low volume film with marked enlargement of
the cardiopericardial silhouette and pulmonary vascular congestion.

## 2009-05-19 ENCOUNTER — Encounter: Payer: Self-pay | Admitting: Family Medicine

## 2009-05-19 ENCOUNTER — Ambulatory Visit: Payer: Self-pay | Admitting: Family Medicine

## 2009-05-19 LAB — CONVERTED CEMR LAB: INR: 3.3

## 2009-05-21 ENCOUNTER — Ambulatory Visit: Payer: Self-pay | Admitting: Family Medicine

## 2009-05-21 ENCOUNTER — Encounter: Payer: Self-pay | Admitting: Family Medicine

## 2009-05-23 ENCOUNTER — Encounter: Payer: Self-pay | Admitting: *Deleted

## 2009-05-23 LAB — CONVERTED CEMR LAB
ALT: 39 units/L — ABNORMAL HIGH (ref 0–35)
AST: 19 units/L (ref 0–37)
Albumin: 3.8 g/dL (ref 3.5–5.2)
Alkaline Phosphatase: 99 units/L (ref 39–117)
BUN: 29 mg/dL — ABNORMAL HIGH (ref 6–23)
CO2: 28 meq/L (ref 19–32)
Calcium: 9.2 mg/dL (ref 8.4–10.5)
Chloride: 97 meq/L (ref 96–112)
Creatinine, Ser: 1.29 mg/dL — ABNORMAL HIGH (ref 0.40–1.20)
Glucose, Bld: 316 mg/dL — ABNORMAL HIGH (ref 70–99)
Magnesium: 2 mg/dL (ref 1.5–2.5)
Potassium: 3 meq/L — ABNORMAL LOW (ref 3.5–5.3)
Sodium: 141 meq/L (ref 135–145)
Total Bilirubin: 0.5 mg/dL (ref 0.3–1.2)
Total Protein: 6.1 g/dL (ref 6.0–8.3)
Vit D, 25-Hydroxy: 22 ng/mL — ABNORMAL LOW (ref 30–89)

## 2009-05-29 ENCOUNTER — Encounter: Payer: Self-pay | Admitting: Family Medicine

## 2009-05-29 ENCOUNTER — Encounter: Payer: Self-pay | Admitting: Cardiology

## 2009-06-02 ENCOUNTER — Ambulatory Visit: Payer: Self-pay | Admitting: Family Medicine

## 2009-06-02 LAB — CONVERTED CEMR LAB: INR: 2.1

## 2009-06-03 IMAGING — CR DG CHEST 2V
2 series · 2 of 2 positions shown · non-contrast
Comparison: 12/01/2008

CLINICAL DATA: Left sided pleuritic chest pain.  Shortness of
breath.

CHEST - 2 VIEW

[w chest lat]
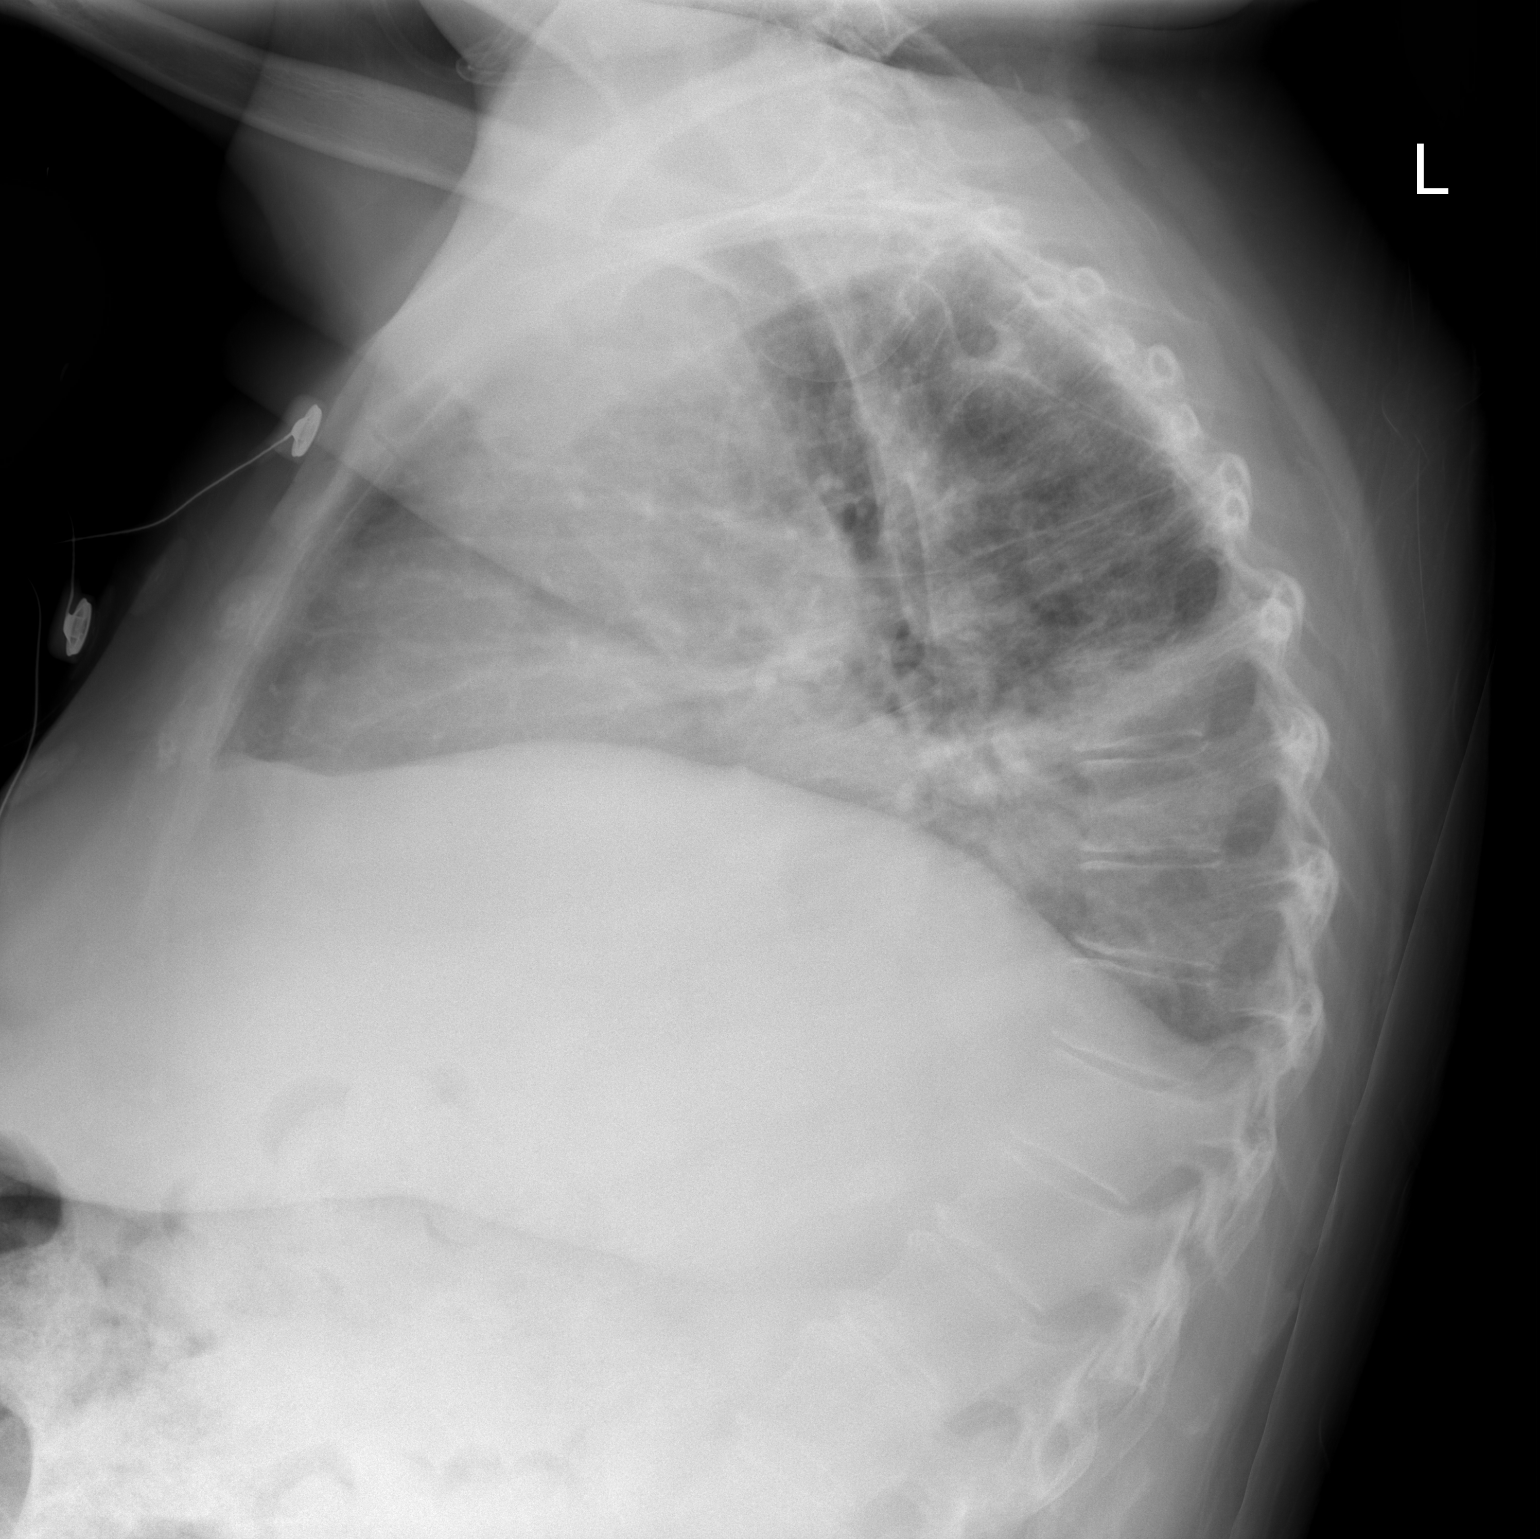

[view not recorded]
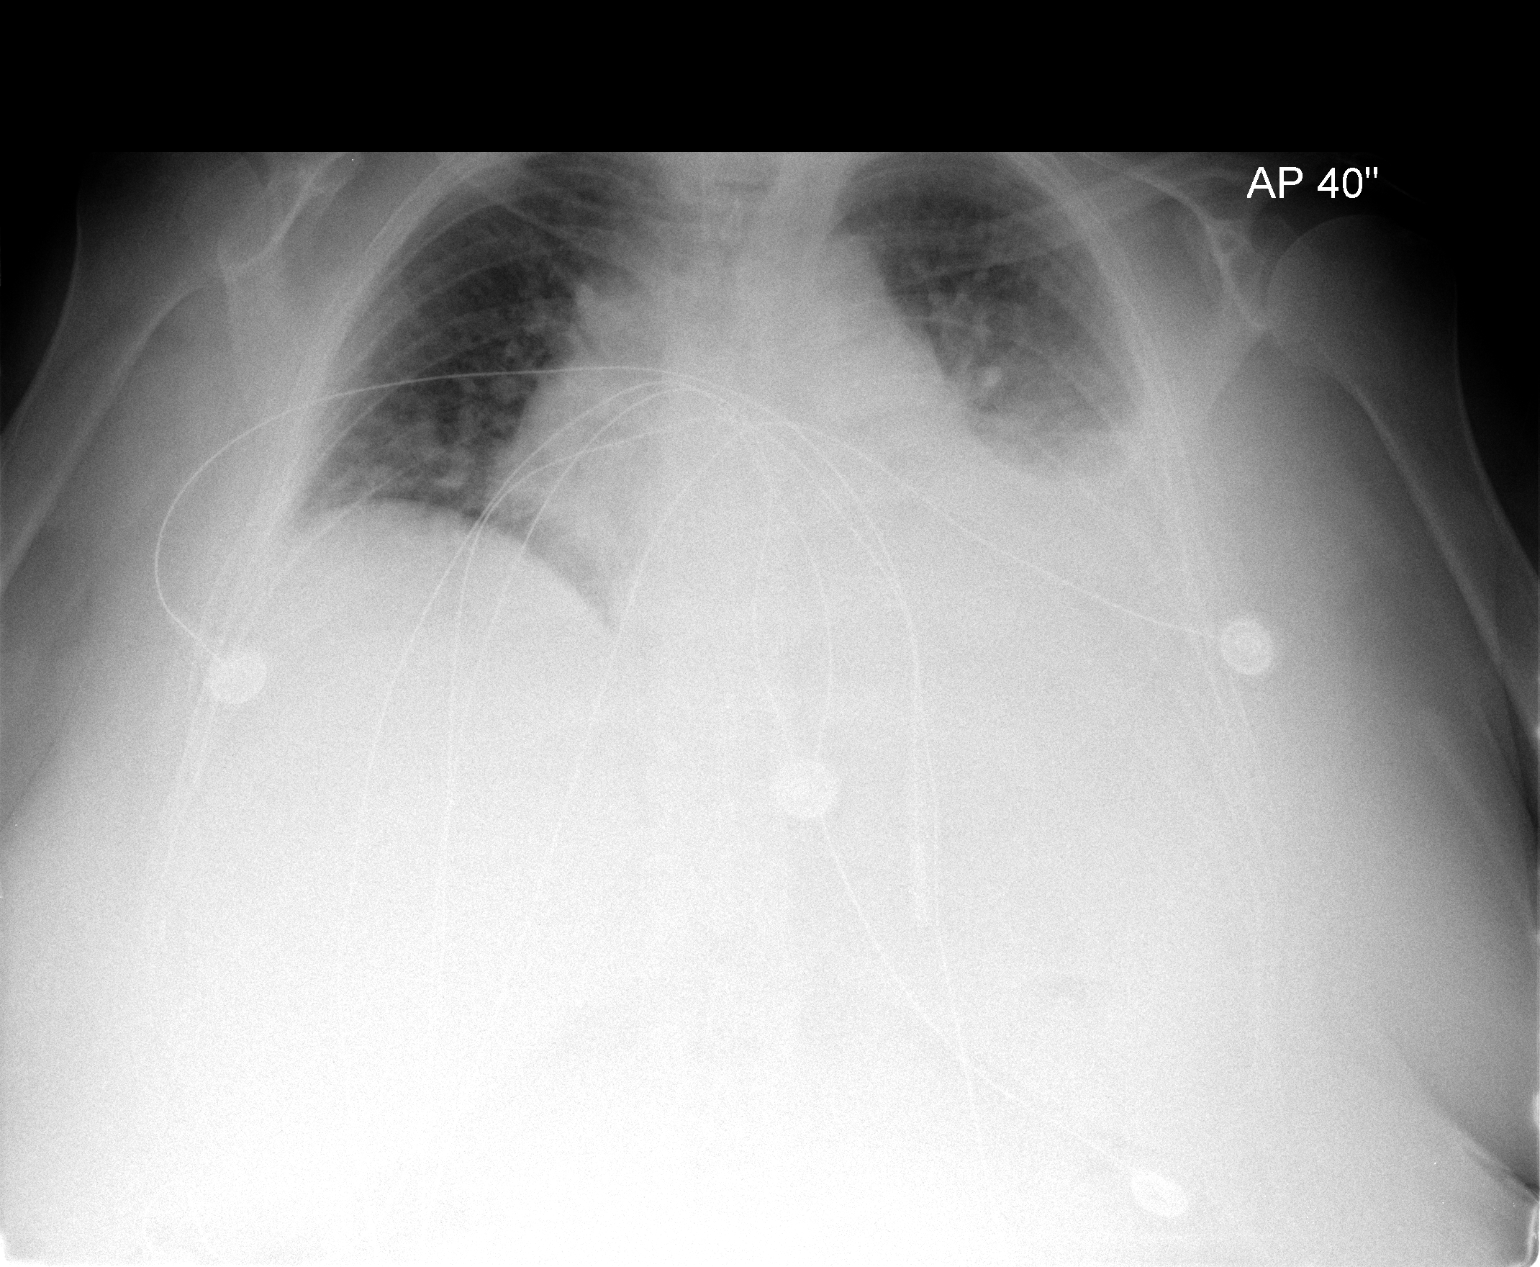

[2 of 2 positions shown; findings below may reference images not displayed]

FINDINGS: Low lung volumes again noted.  Cardiomegaly stable.
Increased interstitial infiltrates are seen suspicious for
interstitial edema.  There is also a moderate left pleural effusion
which is new since prior study.
IMPRESSION: 1.  New moderate left pleural effusion.
2.  Increased diffuse interstitial infiltrates/edema.
3.  Stable cardiomegaly and low lung volumes.

## 2009-06-04 IMAGING — CR DG CHEST DECUBITUS*L*
1 series · 1 of 1 positions shown · non-contrast
Comparison: [DATE]

CLINICAL DATA: Left effusion.  Assess for loculated fluid.

CHEST - LEFT DECUBITUS

[w chest decub.]
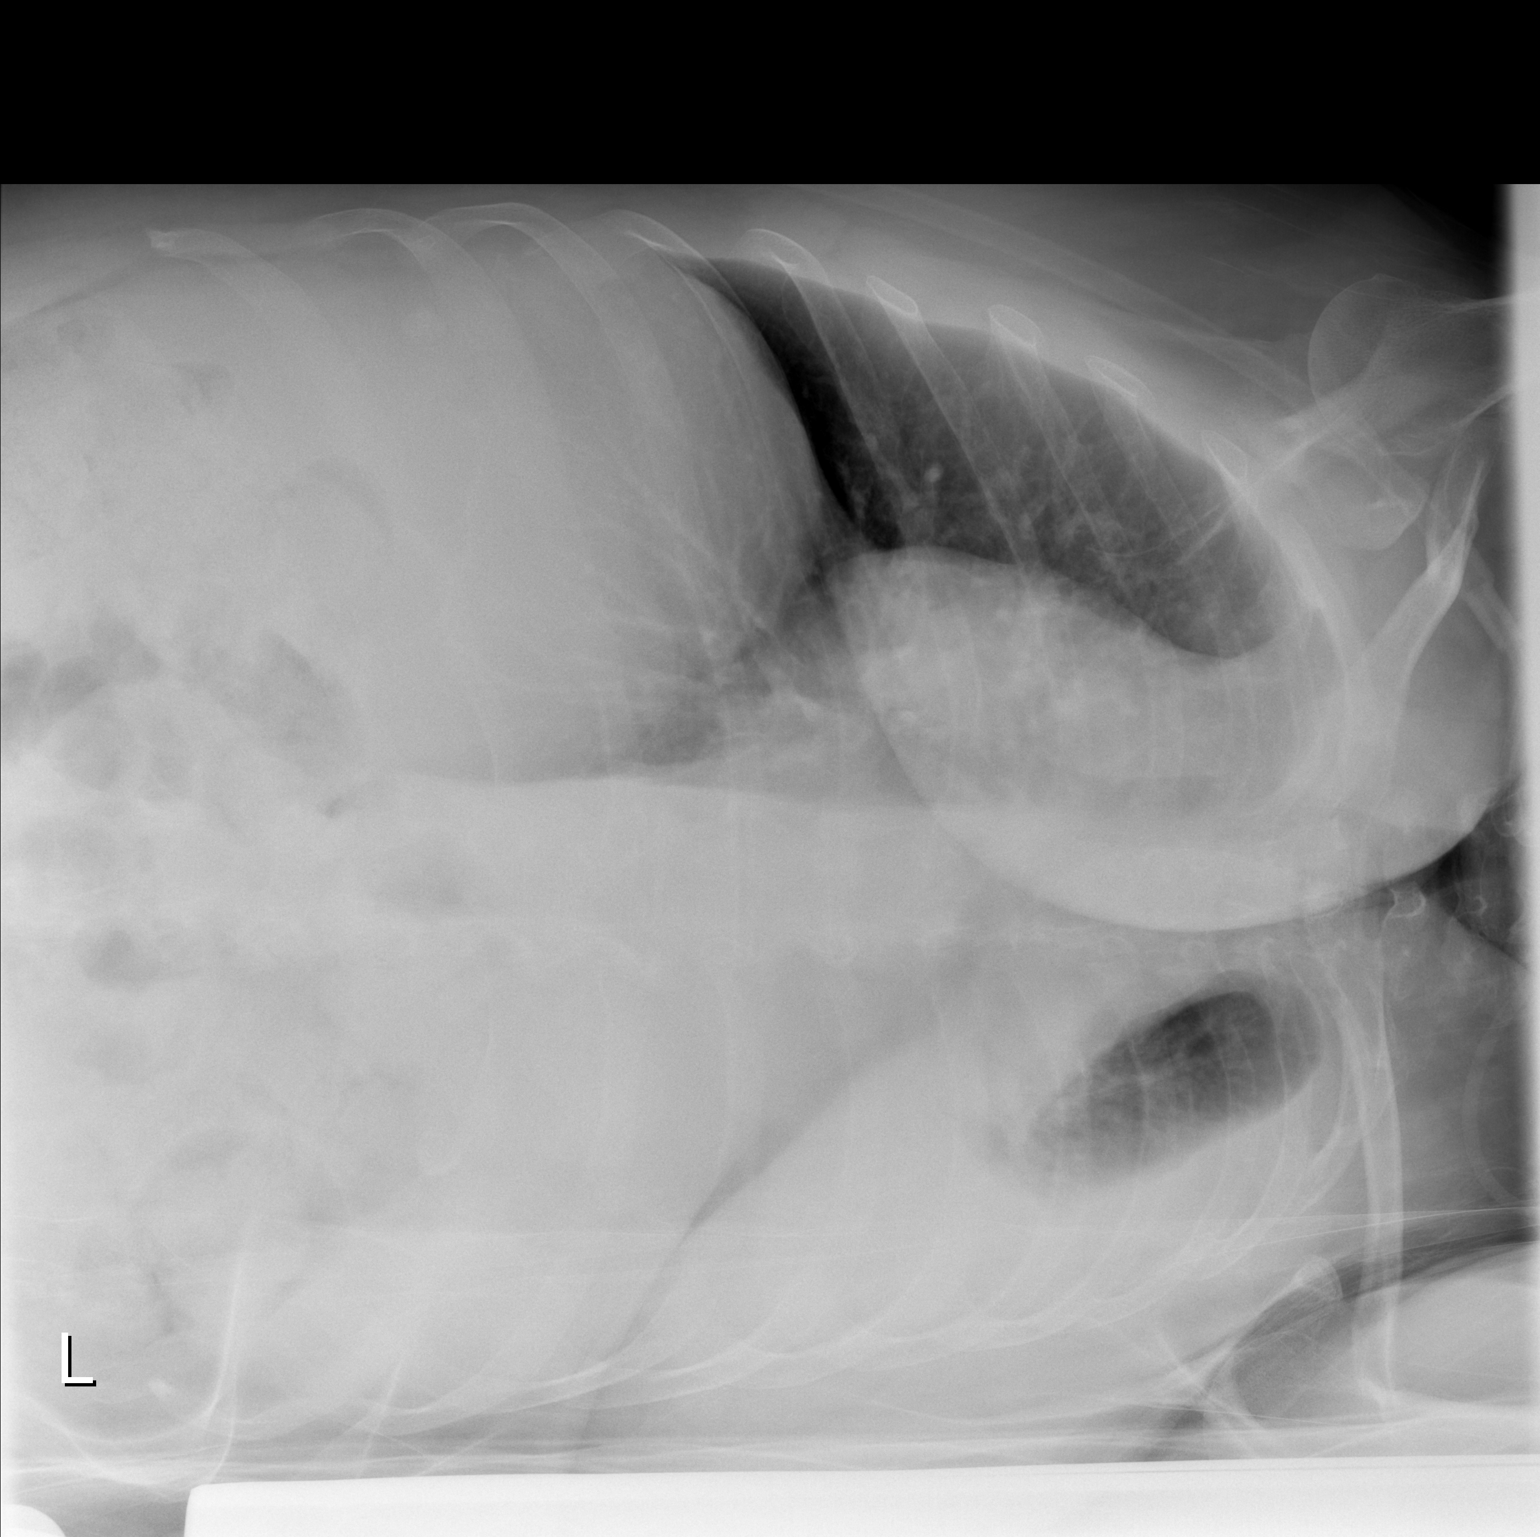

[1 of 1 positions shown; findings below may reference images not displayed]

FINDINGS: The left effusion layers freely on this decubitus view.
IMPRESSION: Freely layering left effusion.

## 2009-06-04 IMAGING — CT CT ANGIO CHEST
2 of 6 series · 19 of 36 positions shown · IV contrast (APPLIED)
Comparison: Chest radiographs and CTA 12/19/2008 and earlier.

CLINICAL DATA: 58-year-old female with chest pain, shortness of
breath, cough.  No pleural effusion. History of pulmonary emboli in
November 2008.

CT ANGIOGRAPHY CHEST WITH CONTRAST
TECHNIQUE: Multidetector CT imaging of the chest was performed
using the standard protocol during bolus administration of
intravenous contrast. Multiplanar CT image reconstructions
including MIPs were obtained to evaluate the vascular anatomy.
Contrast: 100 ml [id number].

[Series 7: pulm embolism 1.0 b25f thins · axial · 0.57mm/px · z∈[+1090,+1270]mm · 17 of 199 slices shown]
[im 10/199  lung]
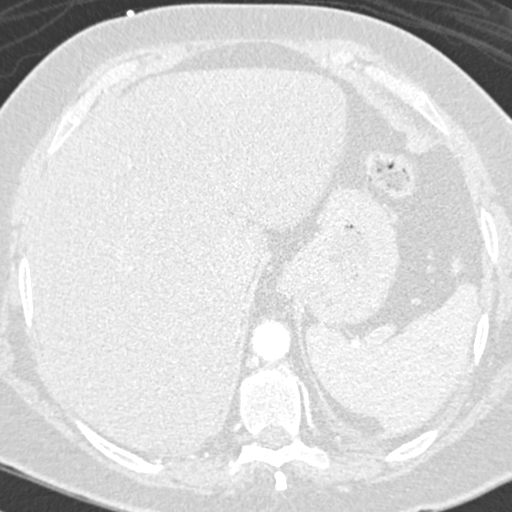
[im 20/199  mediastinal]
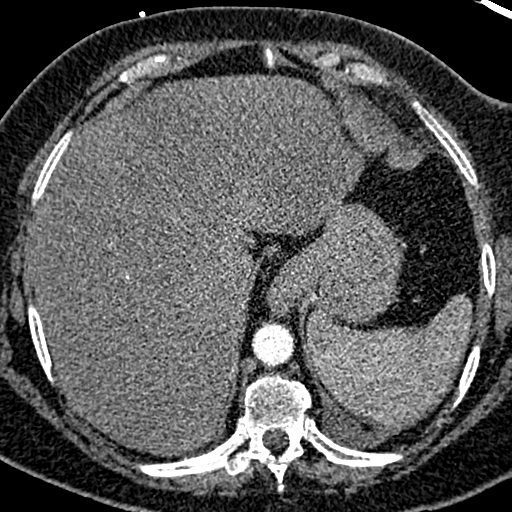
[im 30/199  lung]
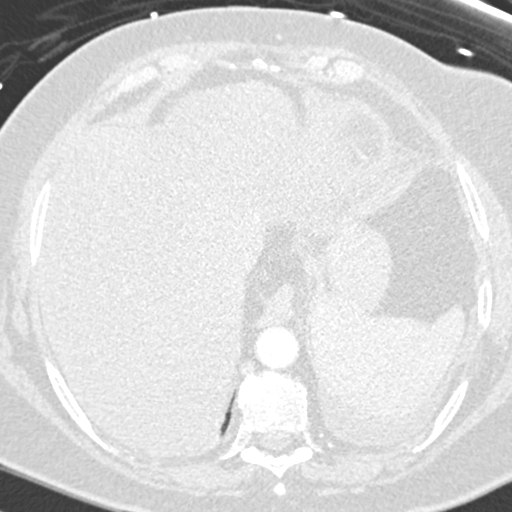
[im 40/199  mediastinal]
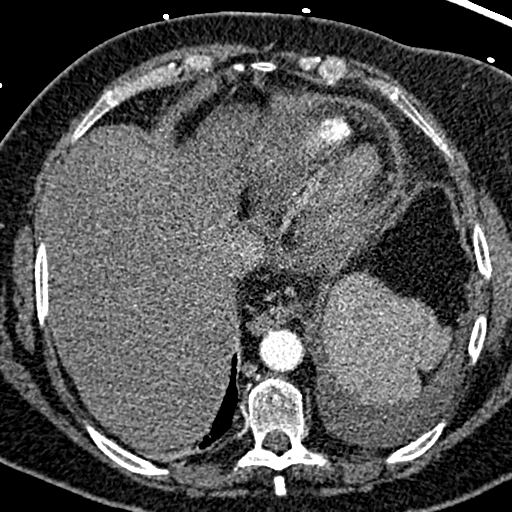
[im 60/199  lung]
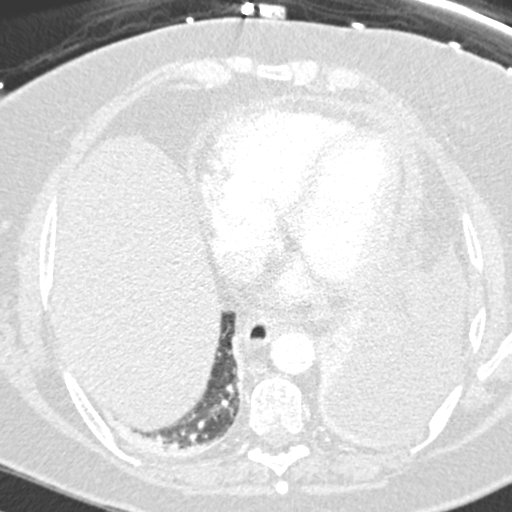
[im 70/199  mediastinal]
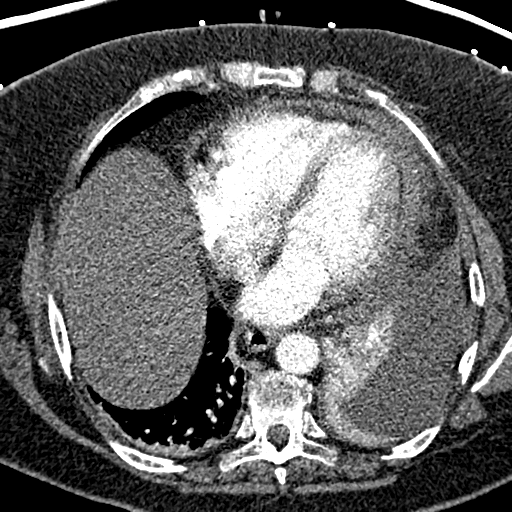
[im 80/199  lung]
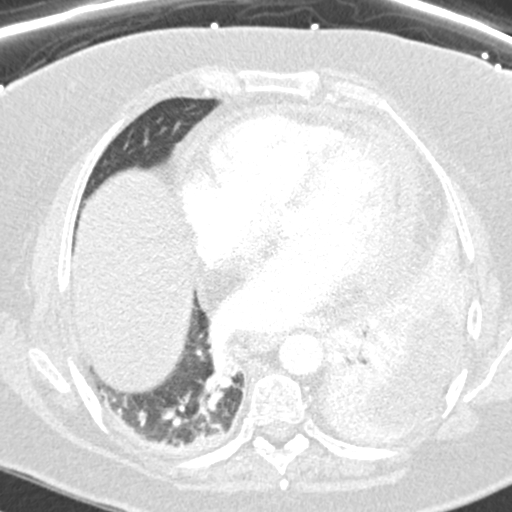
[im 90/199  mediastinal]
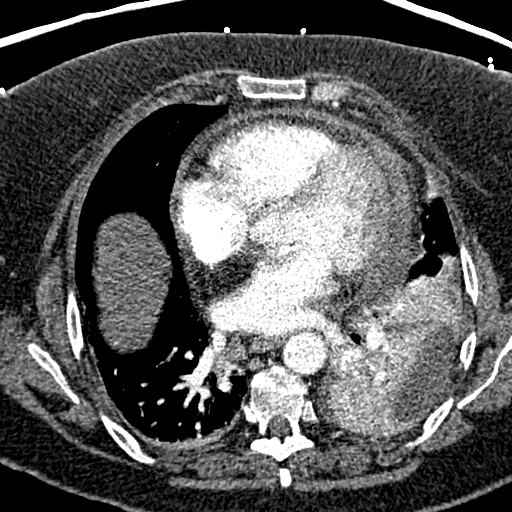
[im 100/199  lung]
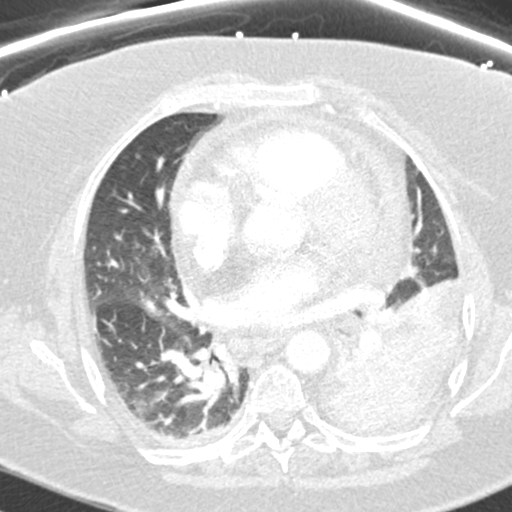
[im 109/199  mediastinal]
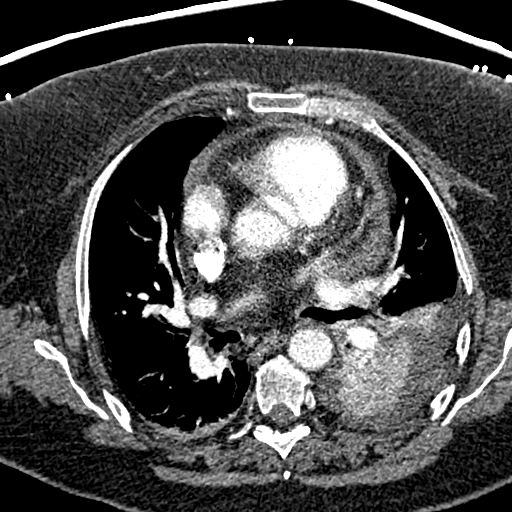
[im 119/199  lung]
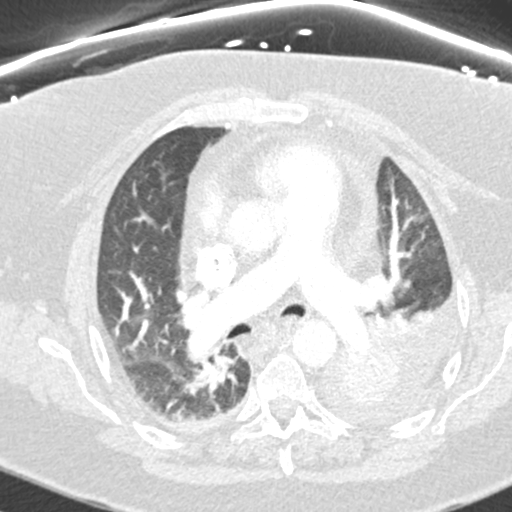
[im 129/199  mediastinal]
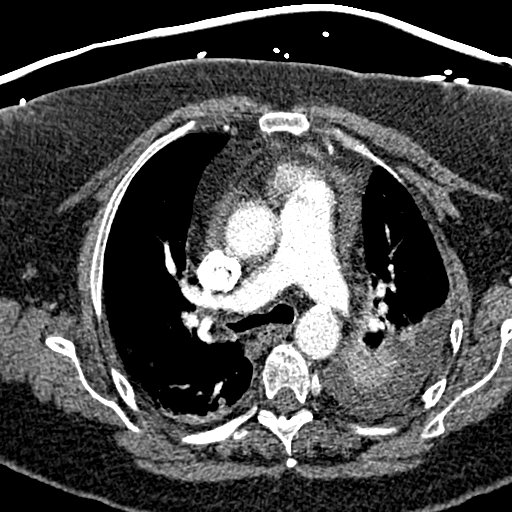
[im 139/199  lung]
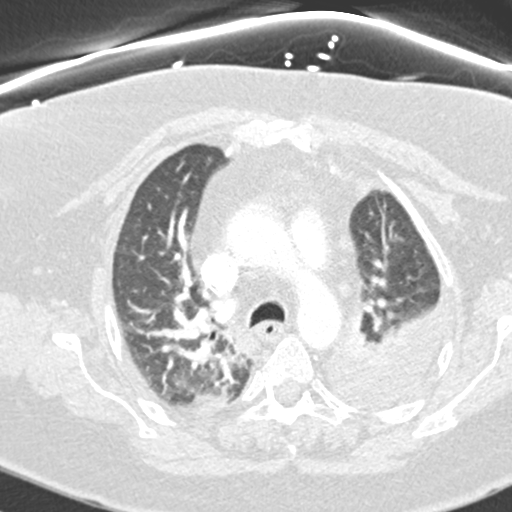
[im 159/199  mediastinal]
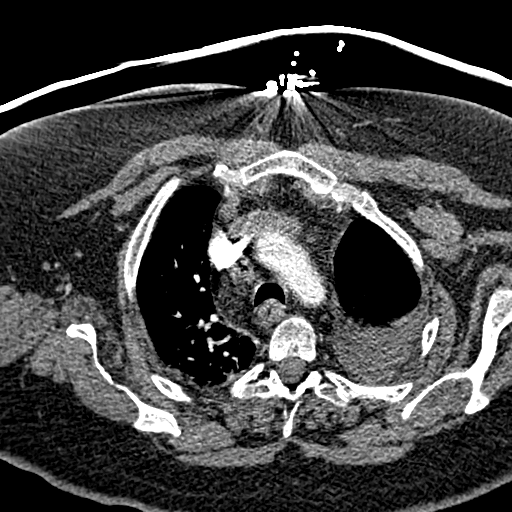
[im 169/199  lung]
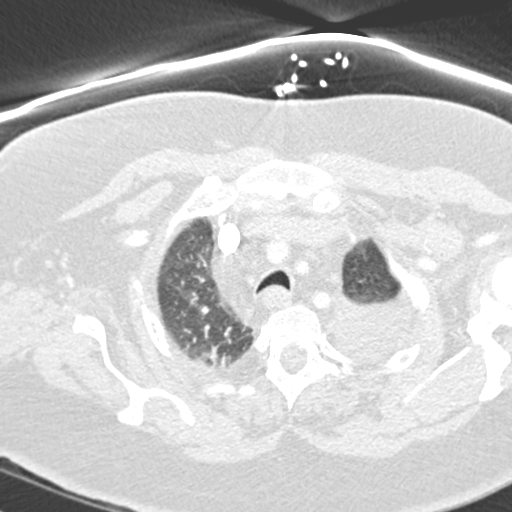
[im 179/199  mediastinal]
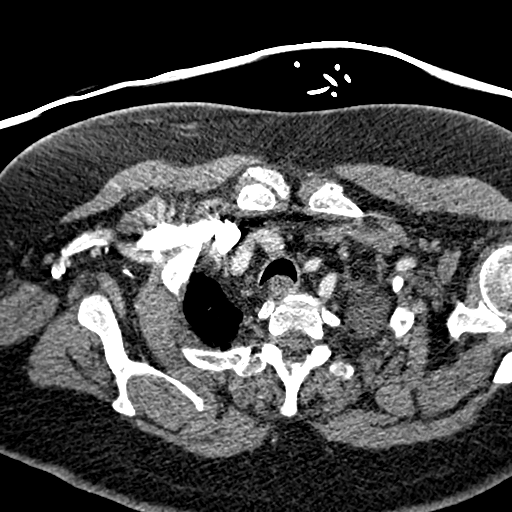
[im 189/199  lung]
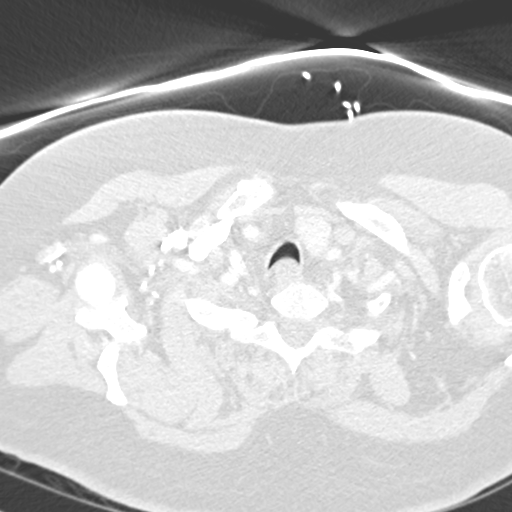

[Series 604: coronal mips · coronal · 0.57mm/px · 2 of 117 slices shown]
[im 39/117  mediastinal]
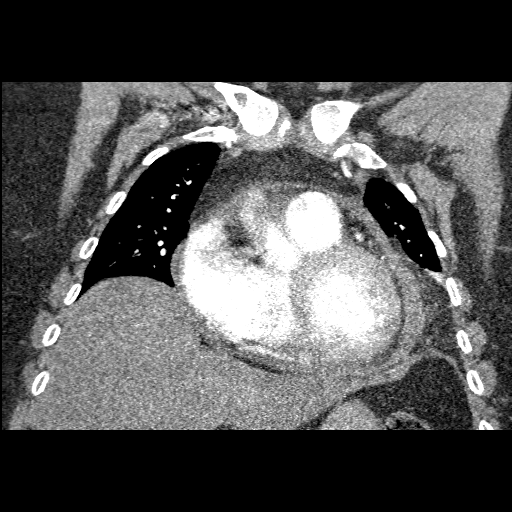
[im 78/117  mediastinal]
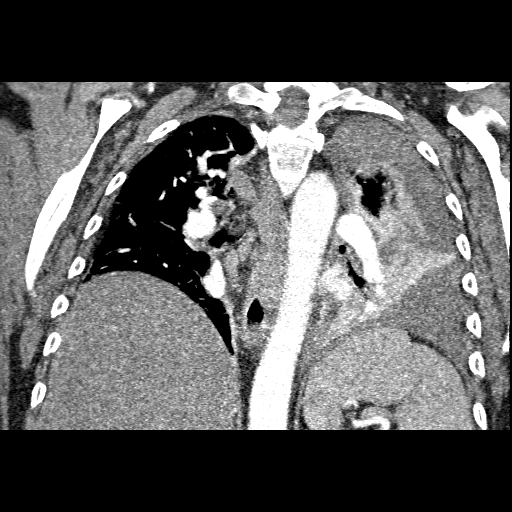

[19 of 36 positions shown; findings below may reference images not displayed]

FINDINGS: Good contrast bolus timing in the pulmonary arterial
tree.  Respiratory motion in the upper lobes.  This includes the
area of pulmonary embolus in the left upper lobe seen on the prior
exam.  Elsewhere, no focal filling defect identified in the
pulmonary arterial tree to suggest the presence of acute pulmonary
embolism.

Moderate volume left pleural effusion with total left lower lobe
collapse.  Moderate pericardial effusion also appears mildly
increased.  No pleural fluid on the right.  Stable reactive tight
mediastinal lymph nodes.

Negative visualized upper abdominal viscera.

Dependent atelectasis at the right lung base.  Compressive
atelectasis throughout the left lung with collapse of the left
lower lobe.  No superimposed airspace consolidation.

No acute osseous abnormality identified.

Review of the MIP images confirms the above findings.
IMPRESSION: 1.  Suboptimal visualization of the left upper lobe pulmonary
artery branches, residual emboli in that distribution cannot be
excluded.  However, the remaining pulmonary artery branches are
patent.
2.  New moderate left pleural effusion with left lower lobe
collapse and compressive atelectasis in the lingula and upper lobe.
3.  Stable to mildly increased moderate pericardial effusion.
4.  Right pulmonary atelectasis.

## 2009-06-08 IMAGING — CR DG CHEST 2V
2 series · 2 of 2 positions shown · non-contrast
Comparison: 12/20/2008

CLINICAL DATA: Evaluate pleural effusion

CHEST - 2 VIEW

[w chest pa]
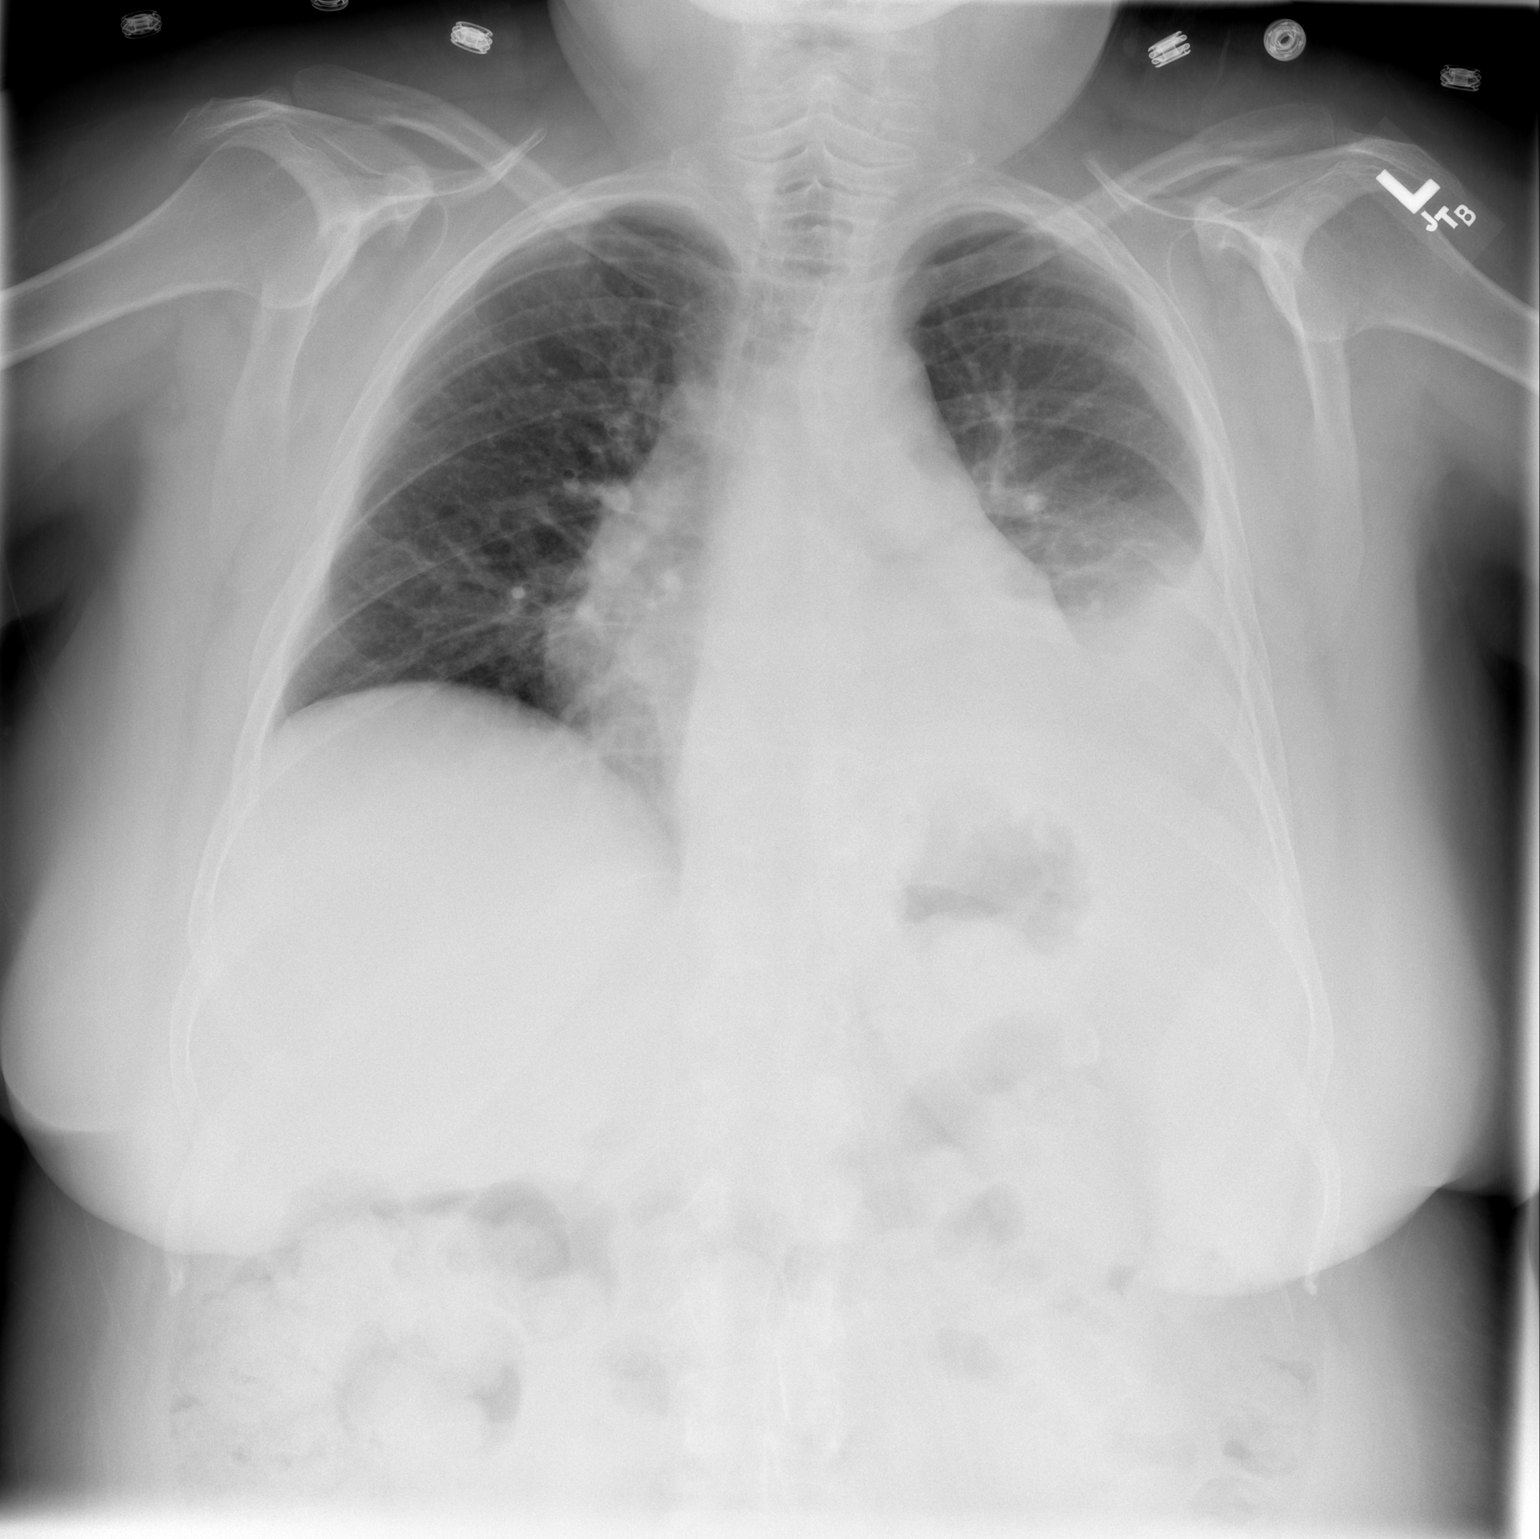

[w chest lat]
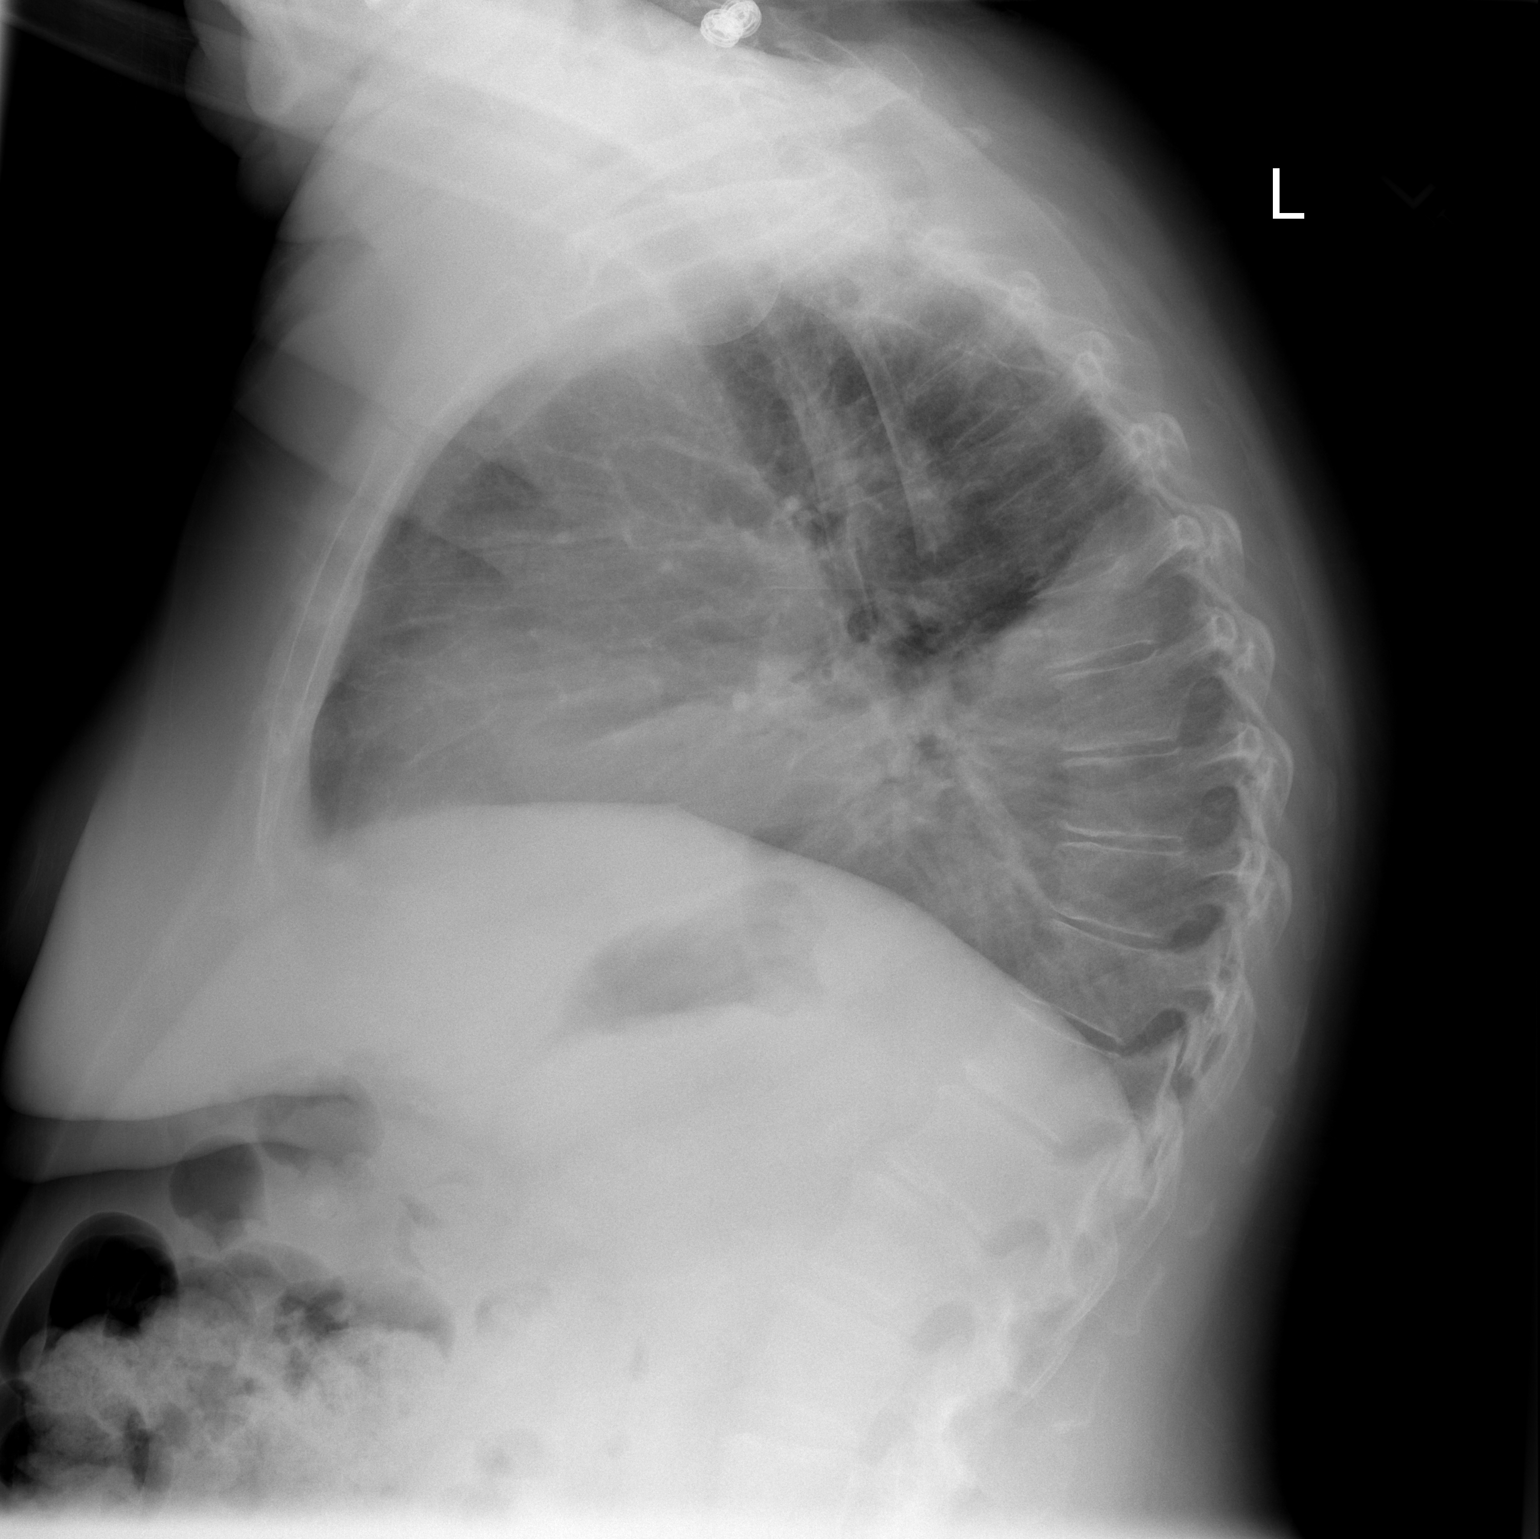

[2 of 2 positions shown; findings below may reference images not displayed]

FINDINGS: There is a large left pleural effusion.

This is not significantly changed in volume from previous exam.

There is overlying atelectasis.

The lung volumes are low.

There is cardiac enlargement.
IMPRESSION: 1.  No change in left pleural effusion.

## 2009-06-16 ENCOUNTER — Ambulatory Visit: Payer: Self-pay | Admitting: Family Medicine

## 2009-06-16 LAB — CONVERTED CEMR LAB: INR: 2.1

## 2009-06-19 ENCOUNTER — Ambulatory Visit: Payer: Self-pay | Admitting: Family Medicine

## 2009-06-24 ENCOUNTER — Ambulatory Visit: Payer: Self-pay | Admitting: Family Medicine

## 2009-06-24 ENCOUNTER — Encounter: Payer: Self-pay | Admitting: Family Medicine

## 2009-06-24 LAB — CONVERTED CEMR LAB
BUN: 21 mg/dL (ref 6–23)
CO2: 31 meq/L (ref 19–32)
Calcium: 9.8 mg/dL (ref 8.4–10.5)
Chloride: 95 meq/L — ABNORMAL LOW (ref 96–112)
Creatinine, Ser: 1.04 mg/dL (ref 0.40–1.20)
Glucose, Bld: 277 mg/dL — ABNORMAL HIGH (ref 70–99)
Potassium: 3.2 meq/L — ABNORMAL LOW (ref 3.5–5.3)
Sodium: 141 meq/L (ref 135–145)

## 2009-06-30 ENCOUNTER — Ambulatory Visit: Payer: Self-pay | Admitting: Family Medicine

## 2009-06-30 ENCOUNTER — Encounter: Payer: Self-pay | Admitting: Family Medicine

## 2009-06-30 LAB — CONVERTED CEMR LAB
Cholesterol: 169 mg/dL (ref 0–200)
HDL: 43 mg/dL (ref >39)
INR: 2.6
LDL Cholesterol: 49 mg/dL (ref 0–99)
Total CHOL/HDL Ratio: 3.9
Triglycerides: 384 mg/dL — ABNORMAL HIGH (ref <150)
VLDL: 77 mg/dL — ABNORMAL HIGH (ref 0–40)

## 2009-07-08 ENCOUNTER — Ambulatory Visit: Payer: Self-pay | Admitting: Family Medicine

## 2009-07-24 ENCOUNTER — Encounter: Payer: Self-pay | Admitting: Family Medicine

## 2009-07-25 ENCOUNTER — Ambulatory Visit: Payer: Self-pay | Admitting: Family Medicine

## 2009-07-28 ENCOUNTER — Ambulatory Visit: Payer: Self-pay | Admitting: Family Medicine

## 2009-07-28 LAB — CONVERTED CEMR LAB: INR: 1.9

## 2009-08-18 ENCOUNTER — Encounter: Payer: Self-pay | Admitting: Family Medicine

## 2009-08-18 ENCOUNTER — Ambulatory Visit: Payer: Self-pay | Admitting: Family Medicine

## 2009-08-18 DIAGNOSIS — E1142 Type 2 diabetes mellitus with diabetic polyneuropathy: Secondary | ICD-10-CM | POA: Insufficient documentation

## 2009-08-18 LAB — CONVERTED CEMR LAB
BUN: 31 mg/dL — ABNORMAL HIGH (ref 6–23)
CO2: 26 meq/L (ref 19–32)
Calcium: 9.8 mg/dL (ref 8.4–10.5)
Chloride: 97 meq/L (ref 96–112)
Creatinine, Ser: 1.36 mg/dL — ABNORMAL HIGH (ref 0.40–1.20)
Glucose, Bld: 285 mg/dL — ABNORMAL HIGH (ref 70–99)
HCT: 41.3 % (ref 36.0–46.0)
Hemoglobin: 14.1 g/dL (ref 12.0–15.0)
Hgb A1c MFr Bld: 10.3 %
INR: 2.1
MCHC: 34.1 g/dL (ref 30.0–36.0)
MCV: 101.7 fL — ABNORMAL HIGH (ref 78.0–100.0)
Platelets: 232 10*3/uL (ref 150–400)
Potassium: 3.3 meq/L — ABNORMAL LOW (ref 3.5–5.3)
RBC: 4.06 M/uL (ref 3.87–5.11)
RDW: 14.9 % (ref 11.5–15.5)
Sodium: 141 meq/L (ref 135–145)
WBC: 9.7 10*3/uL (ref 4.0–10.5)

## 2009-08-19 ENCOUNTER — Ambulatory Visit: Payer: Self-pay | Admitting: Cardiology

## 2009-08-25 IMAGING — CR DG FOOT COMPLETE 3+V*L*
3 series · 3 of 3 positions shown · non-contrast
Comparison: None

CLINICAL DATA: Fell - lateral foot and toe pain

LEFT FOOT - COMPLETE 3+ VIEW

[t foot ap left]
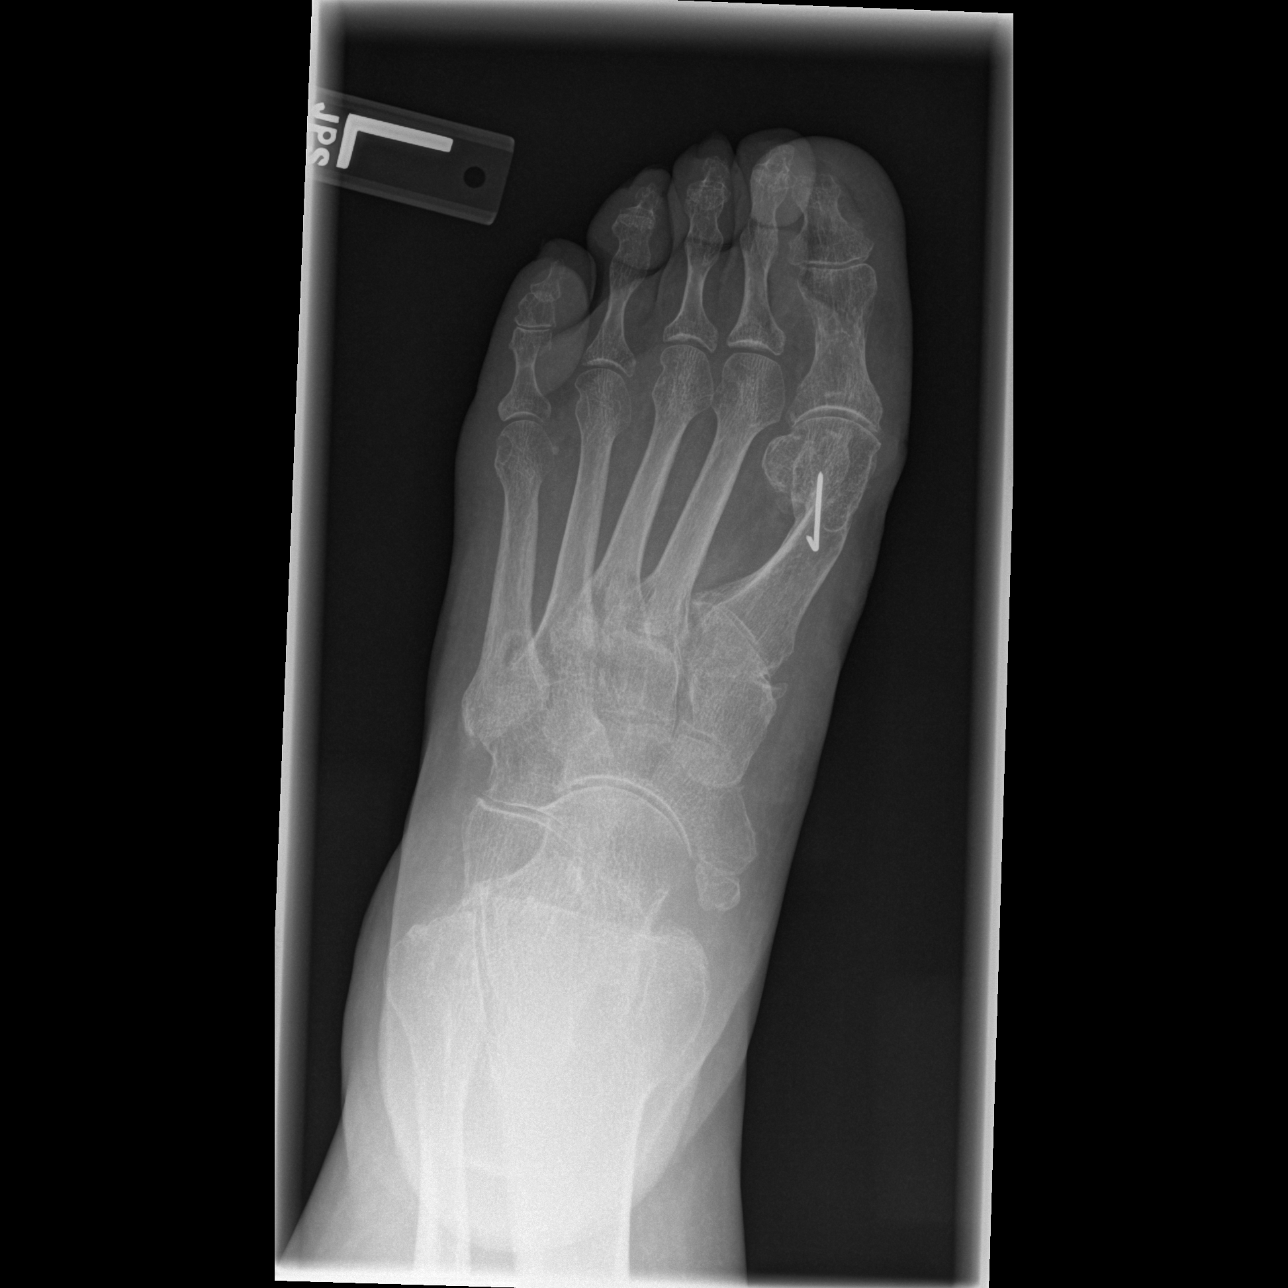

[t foot oblique left]
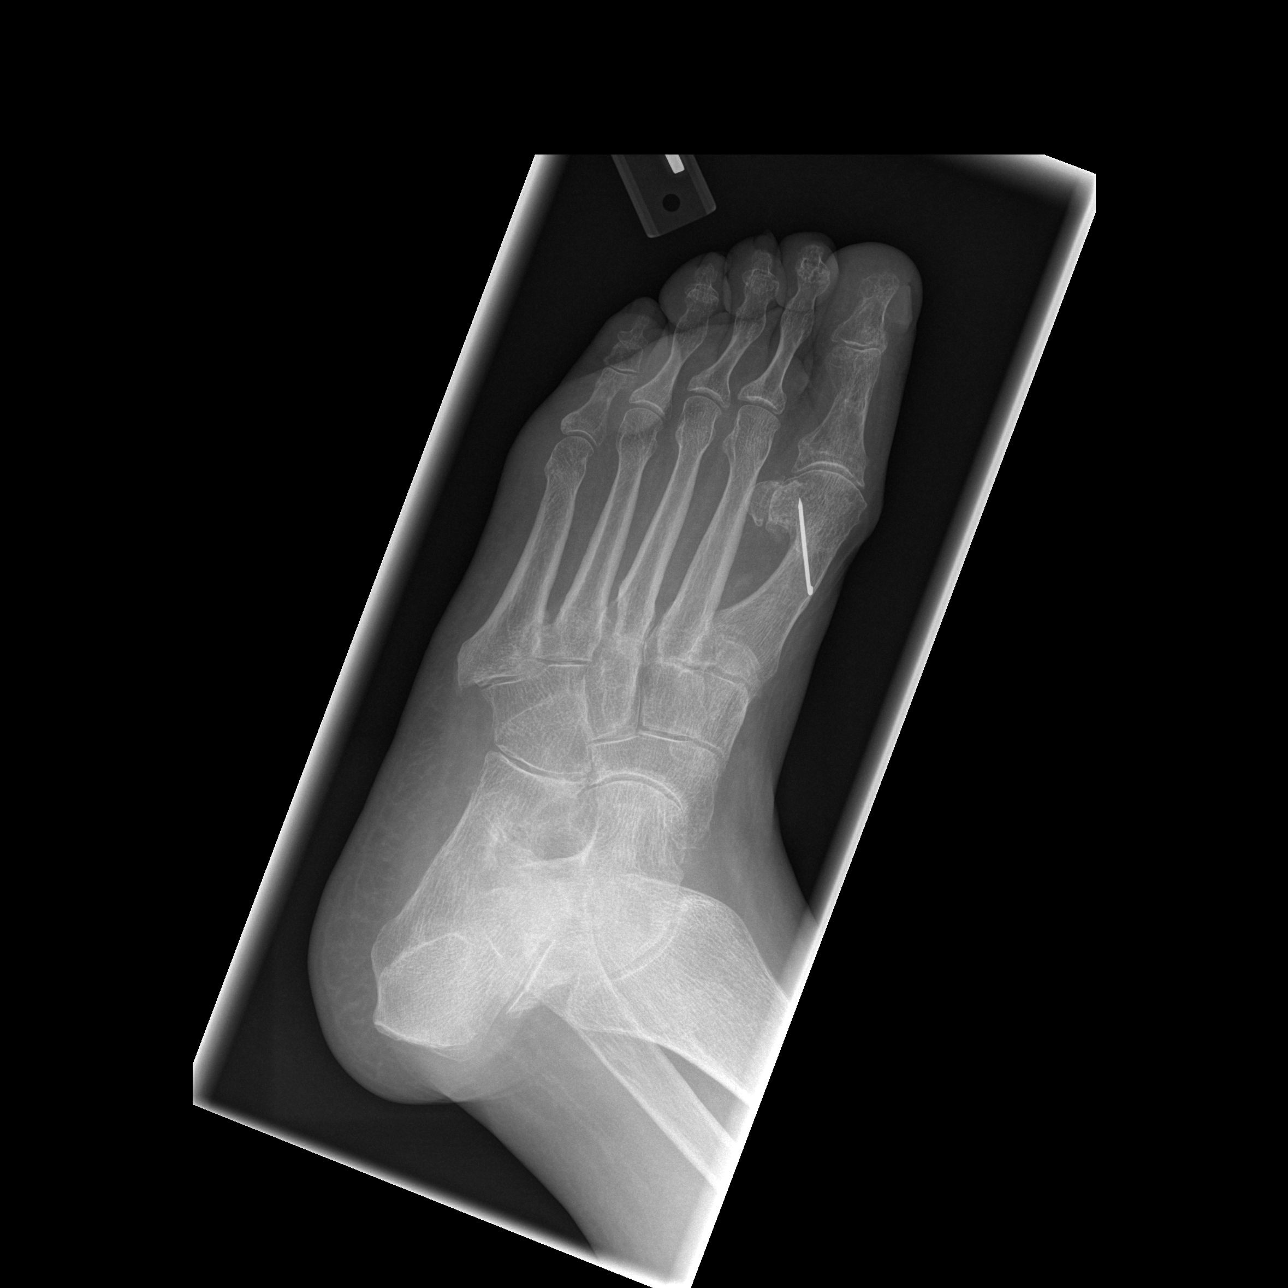

[t foot lat left]
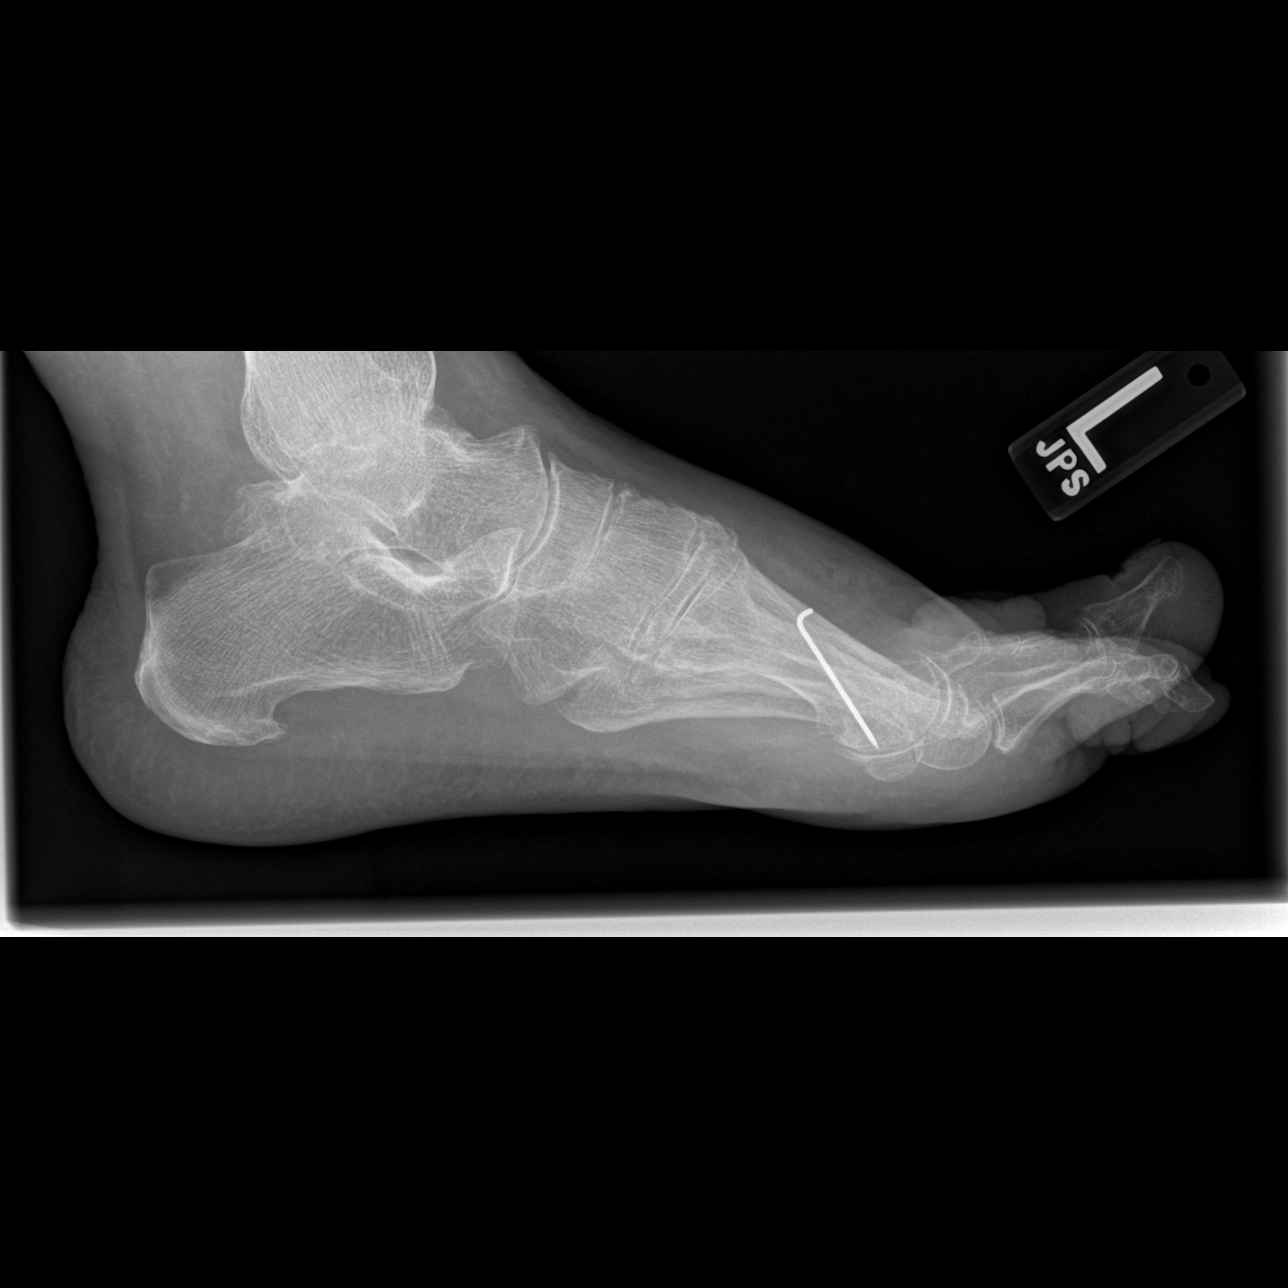

[3 of 3 positions shown; findings below may reference images not displayed]

FINDINGS: The bones are markedly demineralized.  There is a
probable acute fracture of the distal aspect of the proximal
phalanx of the fifth toe, that extends into the joint space.  It is
possible that this is an old injury.

Postsurgical changes of the distal first metatarsal are noted with
AP and in the bone suggesting bunionectomy.
IMPRESSION: 1.  Osteopenia.
2.  Postoperative changes.
3.  Probable acute fracture of the distal aspect of the proximal
phalanx of the fifth toe.

## 2009-08-29 ENCOUNTER — Ambulatory Visit: Payer: Self-pay | Admitting: Cardiology

## 2009-08-29 ENCOUNTER — Ambulatory Visit: Payer: Self-pay

## 2009-08-29 ENCOUNTER — Ambulatory Visit (HOSPITAL_COMMUNITY): Admission: RE | Admit: 2009-08-29 | Discharge: 2009-08-29 | Payer: Self-pay | Admitting: Cardiology

## 2009-08-29 ENCOUNTER — Encounter: Payer: Self-pay | Admitting: Cardiology

## 2009-09-08 ENCOUNTER — Encounter: Payer: Self-pay | Admitting: Cardiology

## 2009-09-11 ENCOUNTER — Ambulatory Visit: Payer: Self-pay | Admitting: Family Medicine

## 2009-09-11 LAB — CONVERTED CEMR LAB: INR: 1.6

## 2009-09-16 ENCOUNTER — Inpatient Hospital Stay (HOSPITAL_COMMUNITY): Admission: EM | Admit: 2009-09-16 | Discharge: 2009-09-18 | Payer: Self-pay | Admitting: Emergency Medicine

## 2009-09-16 ENCOUNTER — Ambulatory Visit: Payer: Self-pay | Admitting: Family Medicine

## 2009-09-16 ENCOUNTER — Encounter: Payer: Self-pay | Admitting: Family Medicine

## 2009-09-17 ENCOUNTER — Ambulatory Visit: Payer: Self-pay | Admitting: Vascular Surgery

## 2009-09-17 ENCOUNTER — Telehealth: Payer: Self-pay | Admitting: Family Medicine

## 2009-09-19 ENCOUNTER — Ambulatory Visit: Payer: Self-pay | Admitting: Family Medicine

## 2009-09-19 LAB — CONVERTED CEMR LAB: INR: 1.8

## 2009-09-23 ENCOUNTER — Ambulatory Visit: Payer: Self-pay | Admitting: Family Medicine

## 2009-09-24 ENCOUNTER — Encounter: Payer: Self-pay | Admitting: Family Medicine

## 2009-09-24 ENCOUNTER — Ambulatory Visit: Payer: Self-pay | Admitting: Family Medicine

## 2009-09-24 LAB — CONVERTED CEMR LAB
BUN: 25 mg/dL — ABNORMAL HIGH
Bilirubin Urine: NEGATIVE
Blood in Urine, dipstick: NEGATIVE
CO2: 28 meq/L
Calcium: 9.7 mg/dL
Chloride: 99 meq/L
Creatinine, Ser: 1.03 mg/dL
Glucose, Bld: 254 mg/dL — ABNORMAL HIGH
Glucose, Urine, Semiquant: NEGATIVE
INR: 2
Ketones, urine, test strip: NEGATIVE
Nitrite: NEGATIVE
Potassium: 4.2 meq/L
Pro B Natriuretic peptide (BNP): 37.9 pg/mL
Protein, U semiquant: NEGATIVE
Sodium: 140 meq/L
Specific Gravity, Urine: 1.01
Urobilinogen, UA: 0.2
pH: 7

## 2009-09-26 ENCOUNTER — Telehealth: Payer: Self-pay | Admitting: *Deleted

## 2009-10-08 ENCOUNTER — Ambulatory Visit: Payer: Self-pay | Admitting: Family Medicine

## 2009-10-08 LAB — CONVERTED CEMR LAB: INR: 2.2

## 2009-10-10 ENCOUNTER — Encounter: Payer: Self-pay | Admitting: *Deleted

## 2009-10-14 ENCOUNTER — Encounter: Payer: Self-pay | Admitting: Family Medicine

## 2009-10-14 ENCOUNTER — Ambulatory Visit: Payer: Self-pay | Admitting: Family Medicine

## 2009-10-14 LAB — CONVERTED CEMR LAB
Bilirubin Urine: NEGATIVE
Blood in Urine, dipstick: NEGATIVE
Glucose, Urine, Semiquant: 500
Ketones, urine, test strip: NEGATIVE
Nitrite: NEGATIVE
Protein, U semiquant: NEGATIVE
Specific Gravity, Urine: 1.01
Urobilinogen, UA: 0.2
WBC Urine, dipstick: NEGATIVE
pH: 6.5

## 2009-10-20 ENCOUNTER — Encounter: Payer: Self-pay | Admitting: Family Medicine

## 2009-10-20 ENCOUNTER — Ambulatory Visit: Payer: Self-pay | Admitting: Family Medicine

## 2009-10-20 LAB — CONVERTED CEMR LAB
Bilirubin Urine: NEGATIVE
Blood in Urine, dipstick: NEGATIVE
Glucose, Urine, Semiquant: 250
Ketones, urine, test strip: NEGATIVE
Nitrite: NEGATIVE
Protein, U semiquant: NEGATIVE
Specific Gravity, Urine: 1.01
Urobilinogen, UA: 0.2
WBC Urine, dipstick: NEGATIVE
pH: 7

## 2009-10-24 ENCOUNTER — Ambulatory Visit: Payer: Self-pay | Admitting: Family Medicine

## 2009-10-24 LAB — CONVERTED CEMR LAB: INR: 2.1

## 2009-10-26 ENCOUNTER — Telehealth: Payer: Self-pay | Admitting: Family Medicine

## 2009-10-31 ENCOUNTER — Telehealth: Payer: Self-pay | Admitting: Family Medicine

## 2009-11-20 ENCOUNTER — Encounter: Admission: RE | Admit: 2009-11-20 | Discharge: 2009-11-20 | Payer: Self-pay | Admitting: Family Medicine

## 2009-11-20 ENCOUNTER — Ambulatory Visit: Payer: Self-pay | Admitting: Family Medicine

## 2009-11-20 LAB — CONVERTED CEMR LAB: INR: 2.1

## 2009-12-04 ENCOUNTER — Ambulatory Visit (HOSPITAL_BASED_OUTPATIENT_CLINIC_OR_DEPARTMENT_OTHER): Admission: RE | Admit: 2009-12-04 | Discharge: 2009-12-04 | Payer: Self-pay | Admitting: Family Medicine

## 2009-12-06 ENCOUNTER — Ambulatory Visit: Payer: Self-pay | Admitting: Internal Medicine

## 2009-12-12 ENCOUNTER — Ambulatory Visit: Payer: Self-pay | Admitting: Family Medicine

## 2009-12-12 ENCOUNTER — Telehealth: Payer: Self-pay | Admitting: Family Medicine

## 2009-12-17 ENCOUNTER — Ambulatory Visit: Payer: Self-pay | Admitting: Family Medicine

## 2009-12-17 LAB — CONVERTED CEMR LAB: INR: 2.4

## 2009-12-22 ENCOUNTER — Telehealth: Payer: Self-pay | Admitting: Family Medicine

## 2010-01-01 ENCOUNTER — Encounter: Payer: Self-pay | Admitting: Family Medicine

## 2010-01-02 ENCOUNTER — Encounter (INDEPENDENT_AMBULATORY_CARE_PROVIDER_SITE_OTHER): Payer: Self-pay | Admitting: *Deleted

## 2010-01-05 ENCOUNTER — Encounter: Payer: Self-pay | Admitting: Family Medicine

## 2010-01-14 ENCOUNTER — Ambulatory Visit: Payer: Self-pay | Admitting: Family Medicine

## 2010-01-14 LAB — CONVERTED CEMR LAB: INR: 2.4

## 2010-01-22 ENCOUNTER — Encounter: Payer: Self-pay | Admitting: Cardiology

## 2010-02-11 ENCOUNTER — Ambulatory Visit: Payer: Self-pay | Admitting: Family Medicine

## 2010-02-11 LAB — CONVERTED CEMR LAB: INR: 2.5

## 2010-02-18 ENCOUNTER — Encounter: Payer: Self-pay | Admitting: Cardiology

## 2010-03-01 IMAGING — CR DG CHEST 1V PORT
1 series · 1 of 1 positions shown · non-contrast
Comparison: 01/02/2009.

CLINICAL DATA: Weakness.  Shortness of breath.

PORTABLE CHEST - 1 VIEW

[AP]
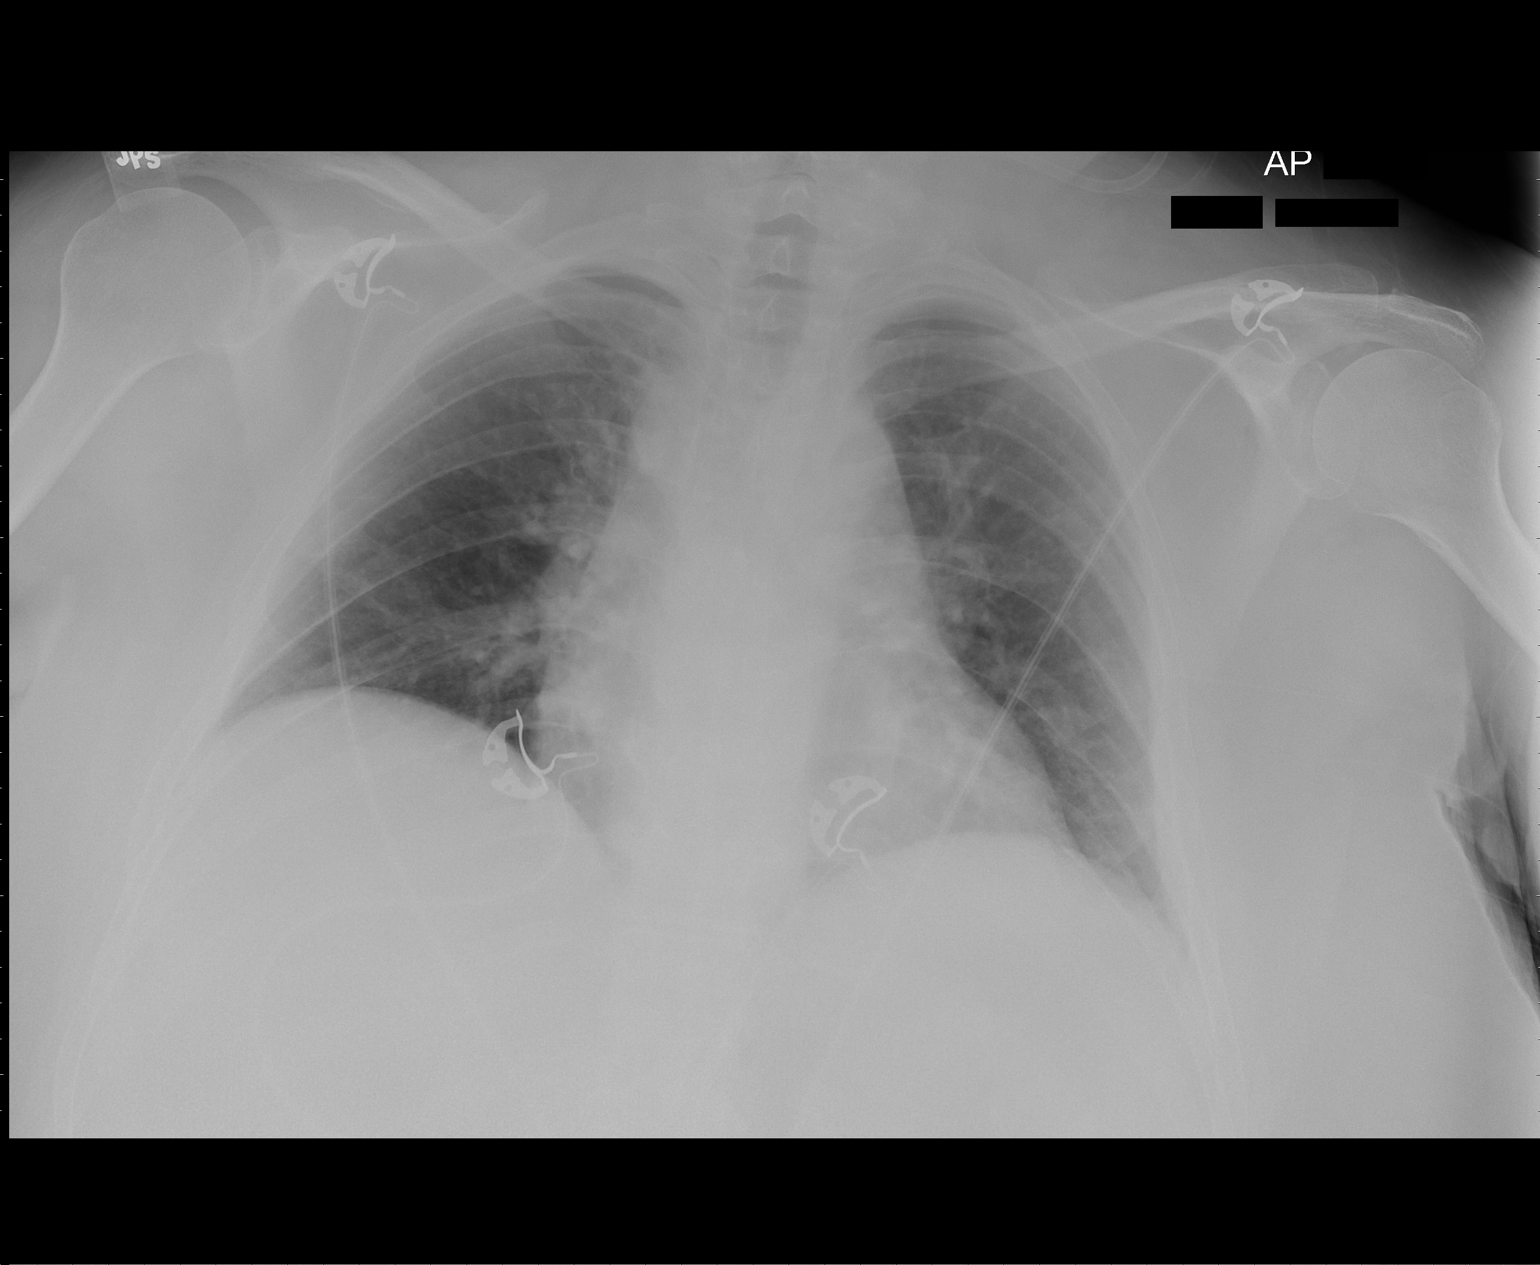

[1 of 1 positions shown; findings below may reference images not displayed]

FINDINGS: The lung volumes are low, exaggerating the heart size.
The costophrenic angles are better visualized on today's exam.
Mild prominence of the interstitium is likely due to the low lung
volumes.  No focal airspace disease is present.  The visualized
soft tissues and bony thorax are unremarkable.
IMPRESSION: 1.  Low lung volumes.
2.  Borderline cardiomegaly without failure.

## 2010-03-01 IMAGING — CT CT HEAD W/O CM
1 of 2 series · 13 of 30 positions shown, 17 images · non-contrast
Comparison: Head CT dated 11/19/2008

CLINICAL DATA: Weakness.  Slurred speech.

CT HEAD WITHOUT CONTRAST
TECHNIQUE: Contiguous axial images were obtained from the base of
the skull through the vertex without contrast.

[Series 2: head routine 4.8 h37s · axial · 0.43mm/px · z∈[-135,-19]mm · 13 of 30 slices shown, 17 images]
[im 3/30  brain]
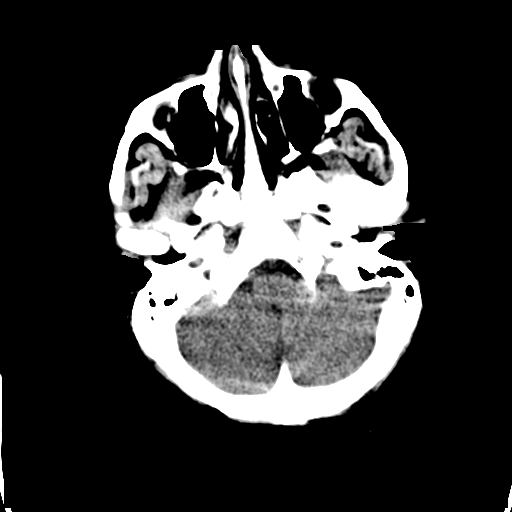
[im 3/30  bone]
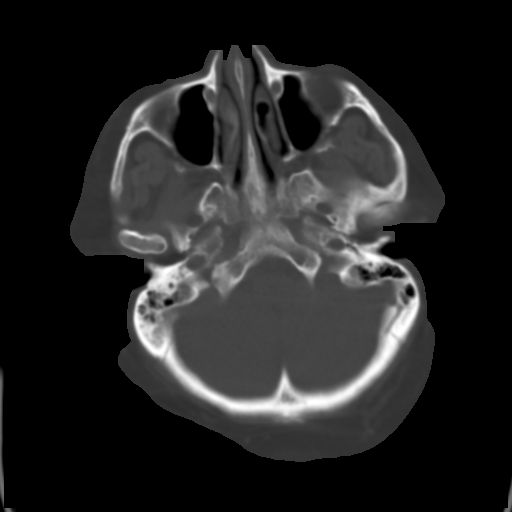
[im 5/30  brain]
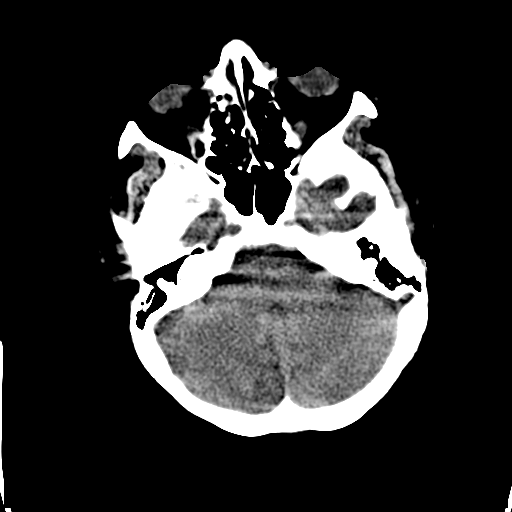
[im 7/30  brain]
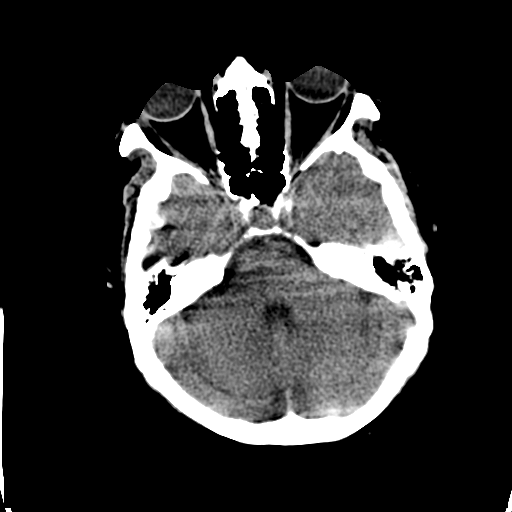
[im 9/30  brain]
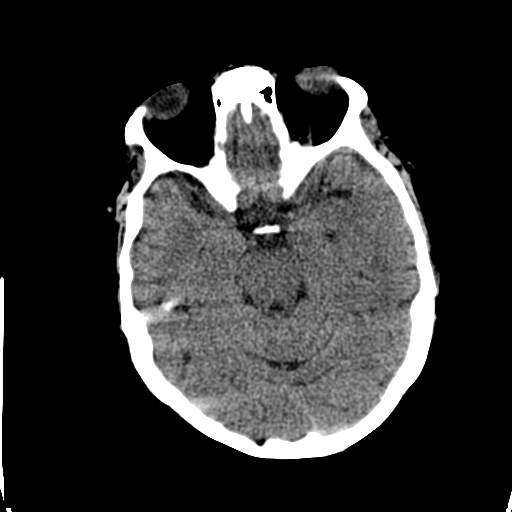
[im 11/30  brain]
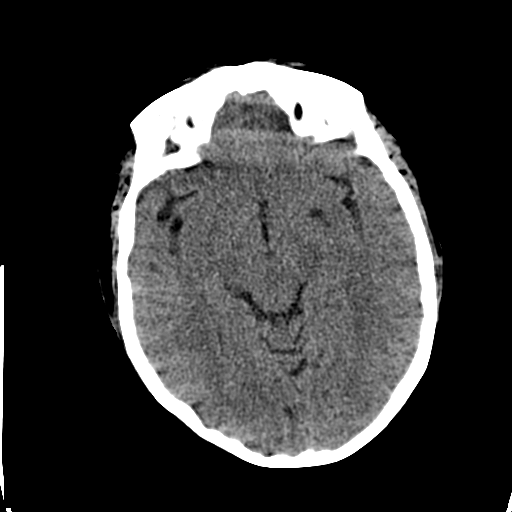
[im 11/30  bone]
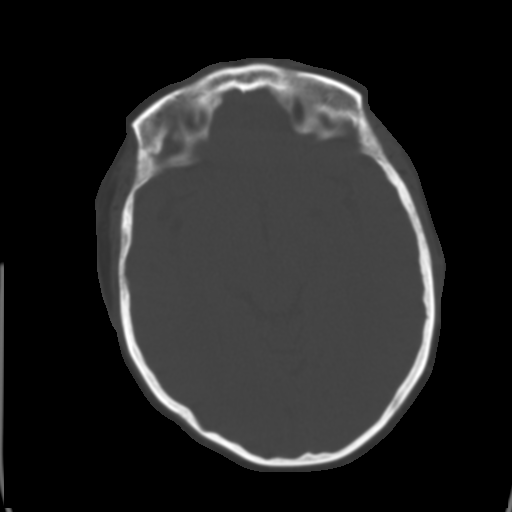
[im 13/30  brain]
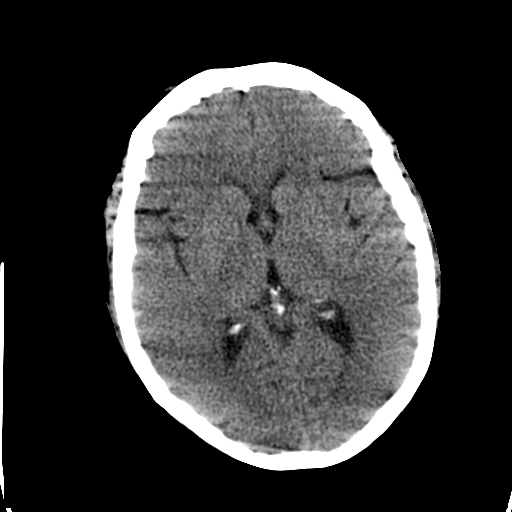
[im 15/30  brain]
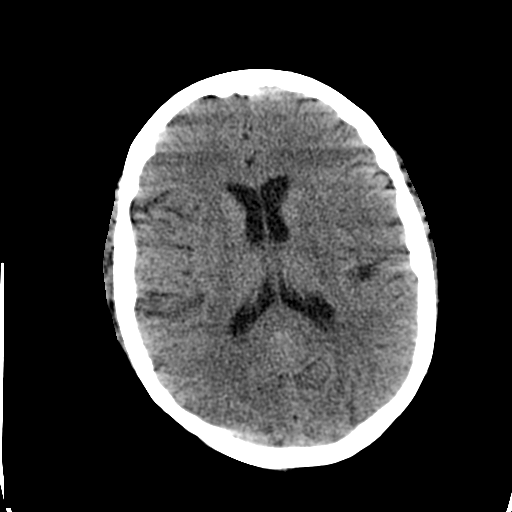
[im 17/30  brain]
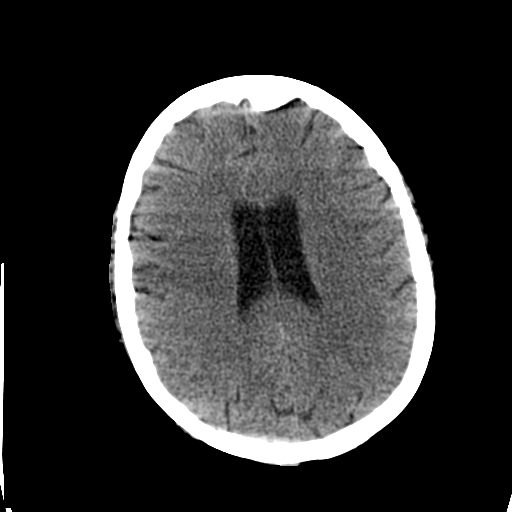
[im 19/30  brain]
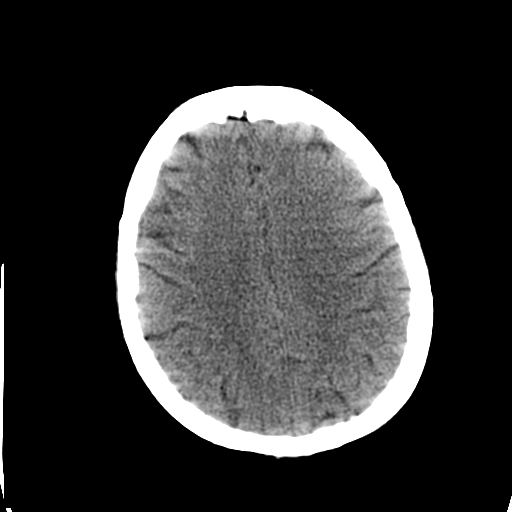
[im 19/30  bone]
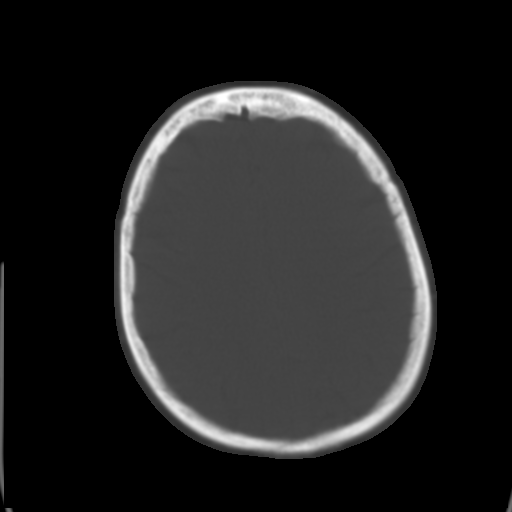
[im 21/30  brain]
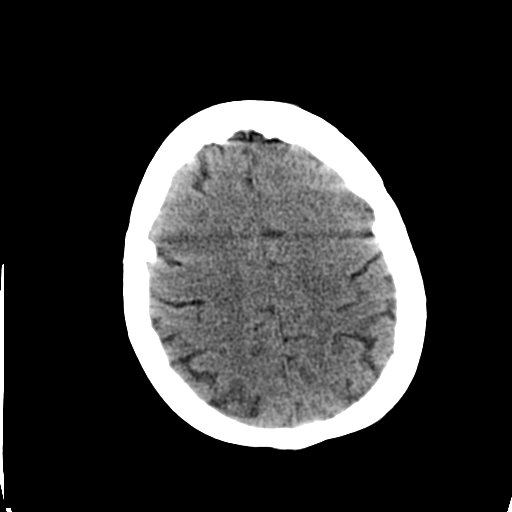
[im 23/30  brain]
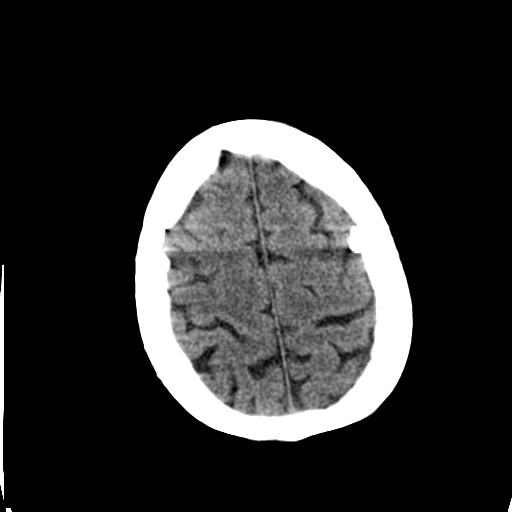
[im 25/30  brain]
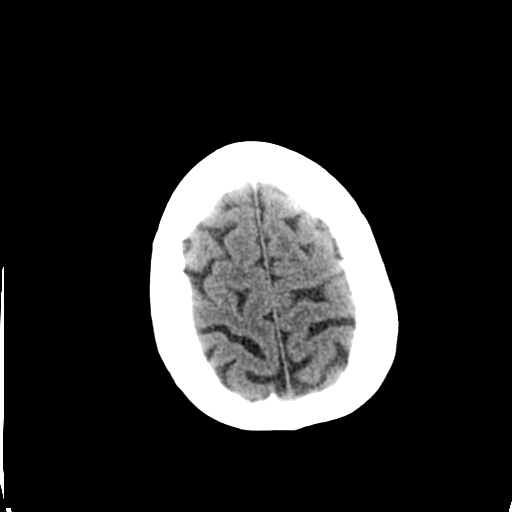
[im 27/30  brain]
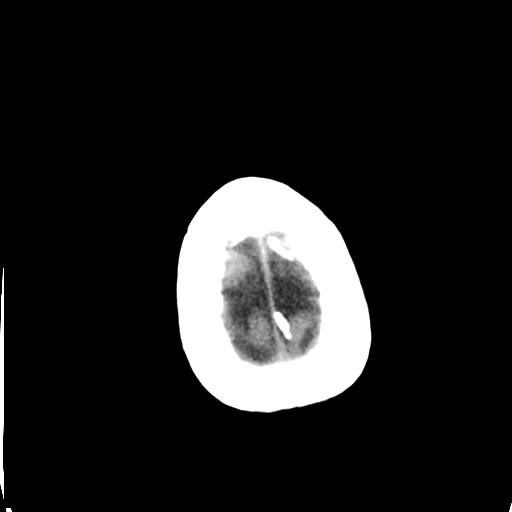
[im 27/30  bone]
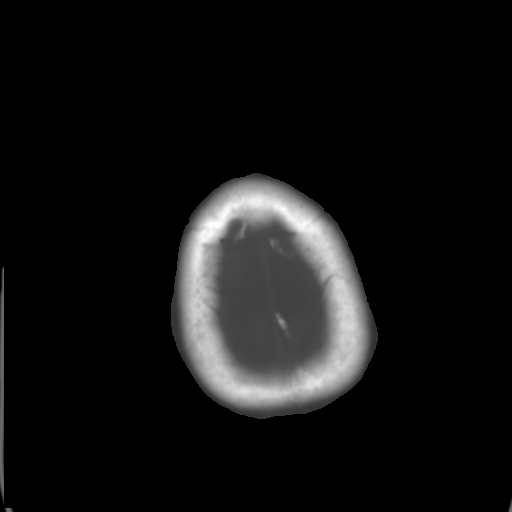

[13 of 30 positions shown; findings below may reference images not displayed]

FINDINGS: Again seen is a focal low density along the lower left
basal ganglia region which may represent a dilated perivascular
space.  In addition, there is stable area of low density along the
superior aspect of the left sylvian fissure.  No evidence for acute
hemorrhage, mass lesion, midline shift, hydrocephalus or large
infarct.  There is no significant paranasal sinus disease.
Negative for acute calvarial fracture.
IMPRESSION: Stable head CT findings.  No acute disease.

## 2010-03-01 IMAGING — MR MR HEAD W/O CM
7 of 9 series · 31 of 48 positions shown · non-contrast
Comparison: 09/16/2009.

CLINICAL DATA: Weakness.  Slurred speech.  Rule out CVA.

MRI HEAD WITHOUT CONTRAST
TECHNIQUE: Multiplanar, multiecho pulse sequences of the brain and
surrounding structures were obtained according to standard protocol
without intravenous contrast.

[Series 4: DWI · axial · 5.0mm · 1.09mm/px · z∈[-75,+64]mm · 9 of 60 slices shown (1 of 3)]
[im 1/60]
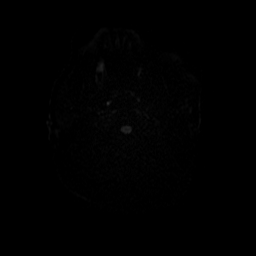
[im 8/60]
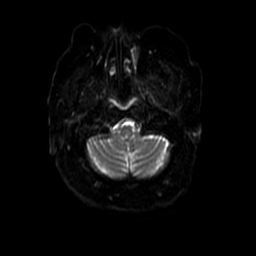
[im 15/60]
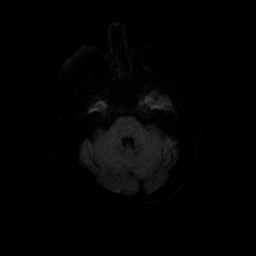
[im 23/60]
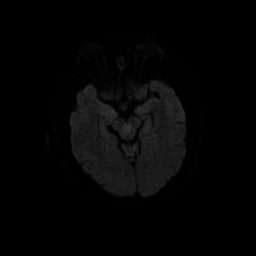
[im 30/60]
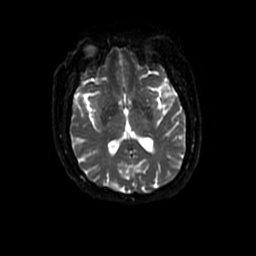
[im 37/60]
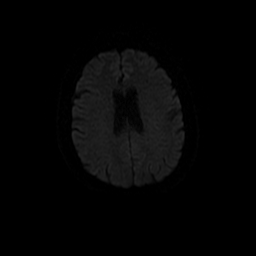
[im 45/60]
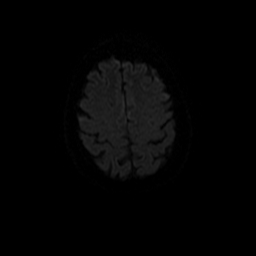
[im 52/60]
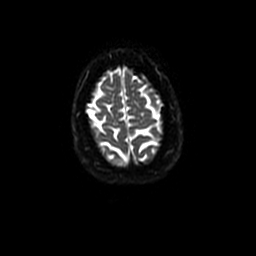
[im 60/60]
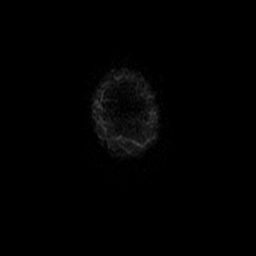

[Series 5: FLAIR · axial · 5.0mm · 0.43mm/px · z∈[-84,+60]mm · 4 of 26 slices shown]
[im 1/26]
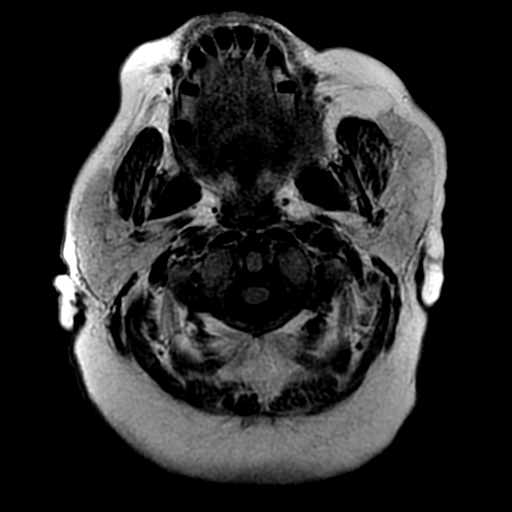
[im 9/26]
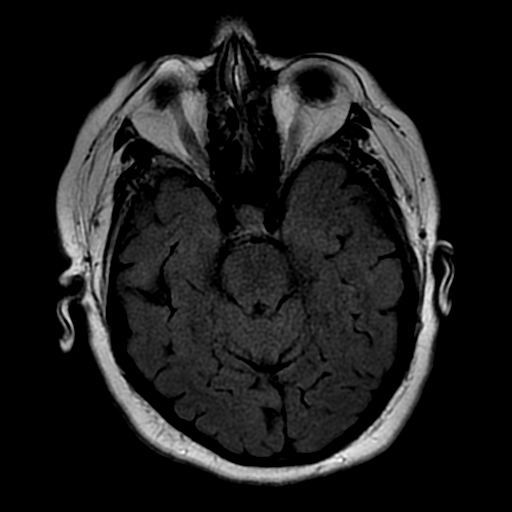
[im 17/26]
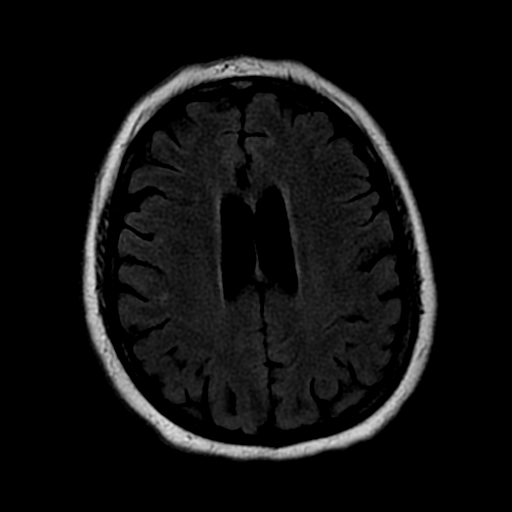
[im 26/26]
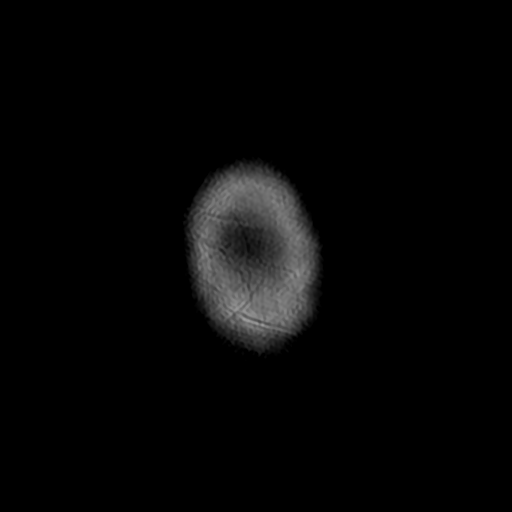

[Series 6: T1 · sagittal · 5.0mm · 0.47mm/px · 2 of 12 slices shown]
[im 1/12]
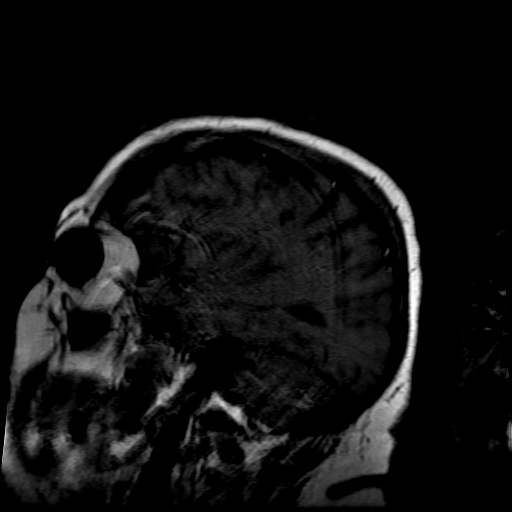
[im 12/12]
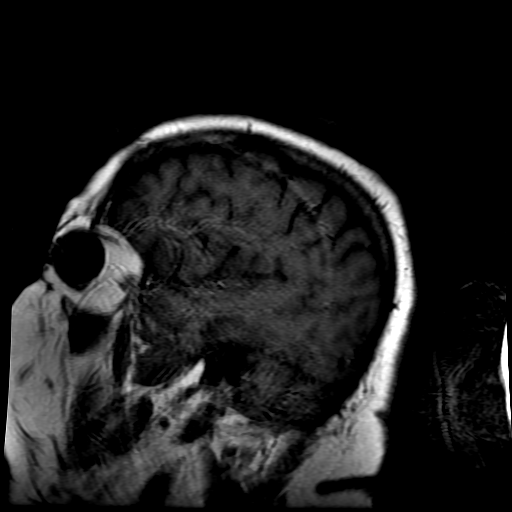

[Series 7: T2 · axial · 5.0mm · 0.43mm/px · z∈[-84,+60]mm · 4 of 26 slices shown]
[im 1/26]
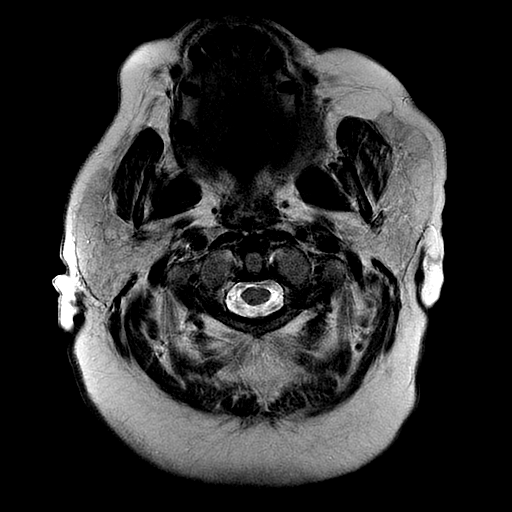
[im 9/26]
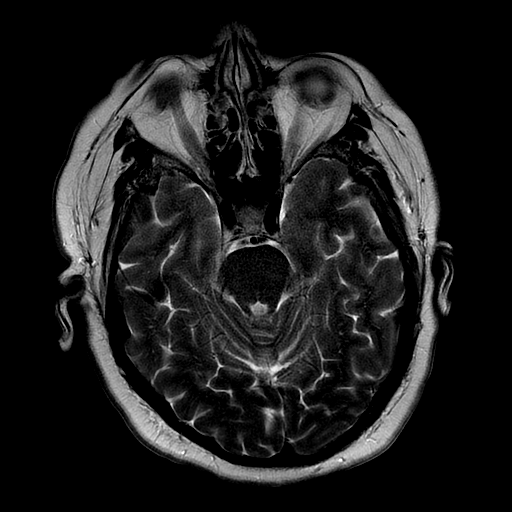
[im 17/26]
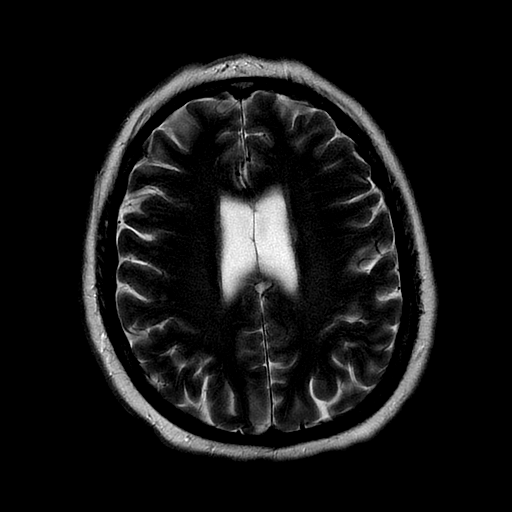
[im 26/26]
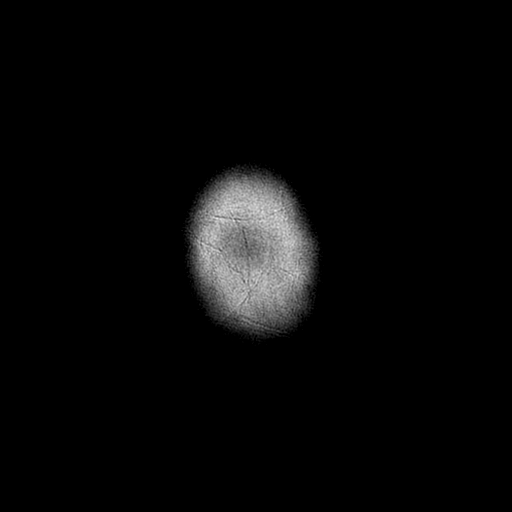

[Series 10: T2 post-contrast · coronal · 5.0mm · 0.39mm/px · 4 of 26 slices shown]
[im 1/26]
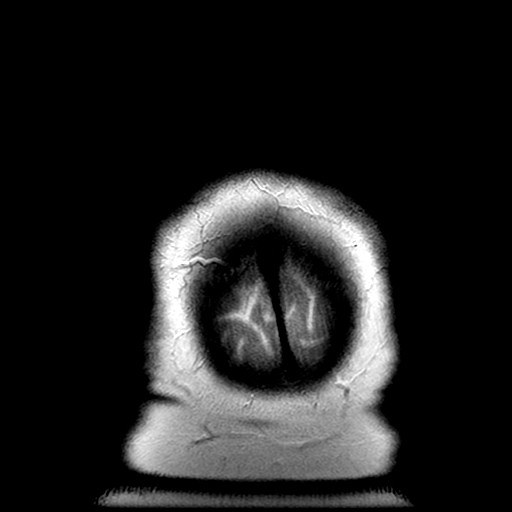
[im 9/26]
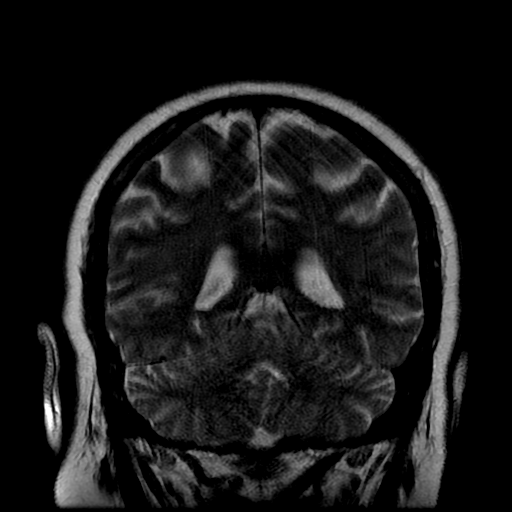
[im 17/26]
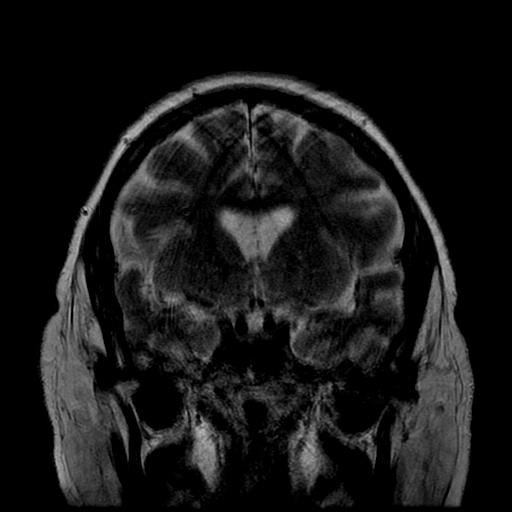
[im 26/26]
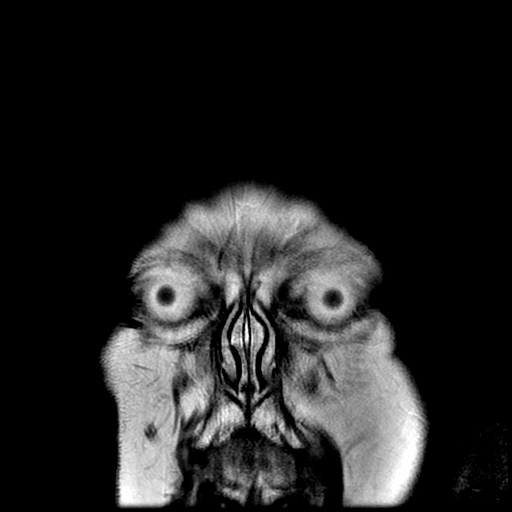

[Series 400: DWI · axial · 5.0mm · 1.09mm/px · z∈[-75,+64]mm · 4 of 30 slices shown (2 of 3)]
[im 1/30]
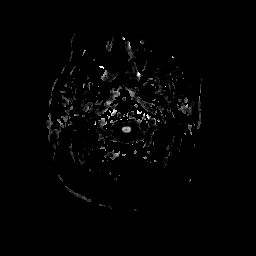
[im 10/30]
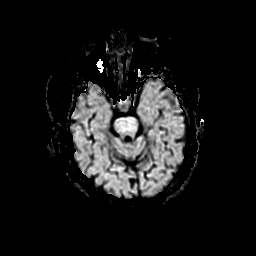
[im 20/30]
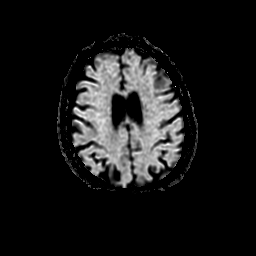
[im 30/30]
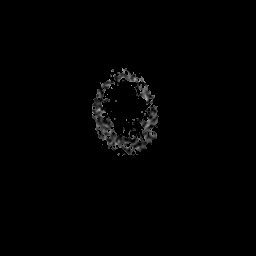

[Series 401: DWI · axial · 5.0mm · 1.09mm/px · z∈[-75,+64]mm · 4 of 30 slices shown (3 of 3)]
[im 1/30]
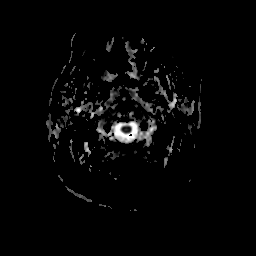
[im 10/30]
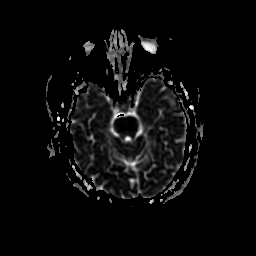
[im 20/30]
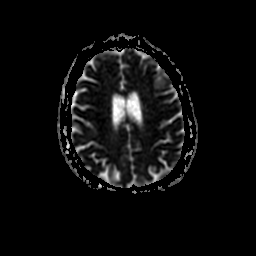
[im 30/30]
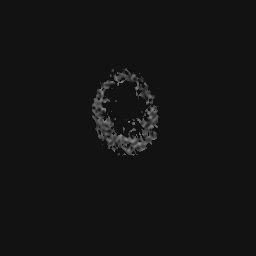

[31 of 48 positions shown; findings below may reference images not displayed]

FINDINGS: Image  quality degraded by patient motion.

Negative for acute infarct.  Scattered small white matter
hyperintensities are most likely due to chronic ischemia which is
mild.  No hemorrhage or mass is present.  Fluid collection or
edema.  Ventricle size is normal.

There is mucosal thickening in the maxillary sinuses bilaterally.
IMPRESSION: No acute intracranial abnormality.

Mild chronic sinusitis.

## 2010-03-11 ENCOUNTER — Ambulatory Visit: Payer: Self-pay | Admitting: Family Medicine

## 2010-03-11 LAB — CONVERTED CEMR LAB: INR: 2.3

## 2010-03-12 ENCOUNTER — Telehealth: Payer: Self-pay | Admitting: *Deleted

## 2010-03-23 ENCOUNTER — Telehealth: Payer: Self-pay | Admitting: *Deleted

## 2010-04-14 ENCOUNTER — Ambulatory Visit: Payer: Self-pay | Admitting: Family Medicine

## 2010-04-14 ENCOUNTER — Encounter: Payer: Self-pay | Admitting: Cardiology

## 2010-04-14 LAB — CONVERTED CEMR LAB: INR: 2.1

## 2010-05-05 IMAGING — CR DG FOOT COMPLETE 3+V*R*
3 series · 3 of 3 positions shown · non-contrast
Comparison: None

CLINICAL DATA: Fifth toe pain, swelling.  Injury 2 days ago.

RIGHT FOOT COMPLETE - 3+ VIEW

[t foot ap right]
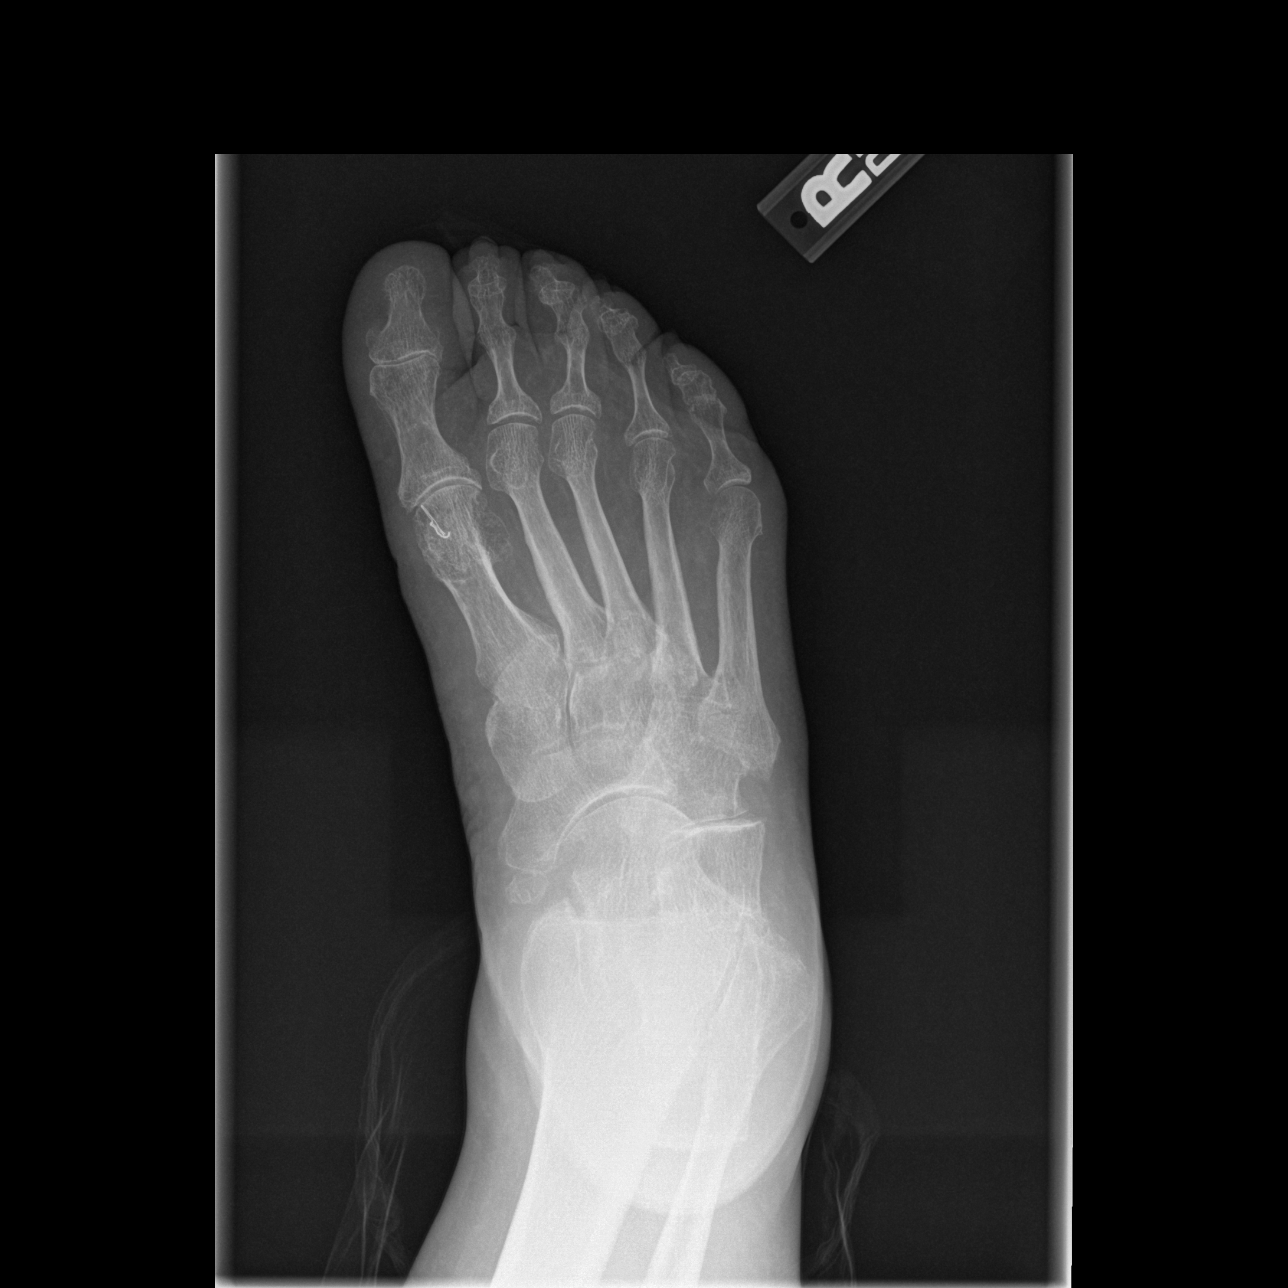

[t foot oblique right]
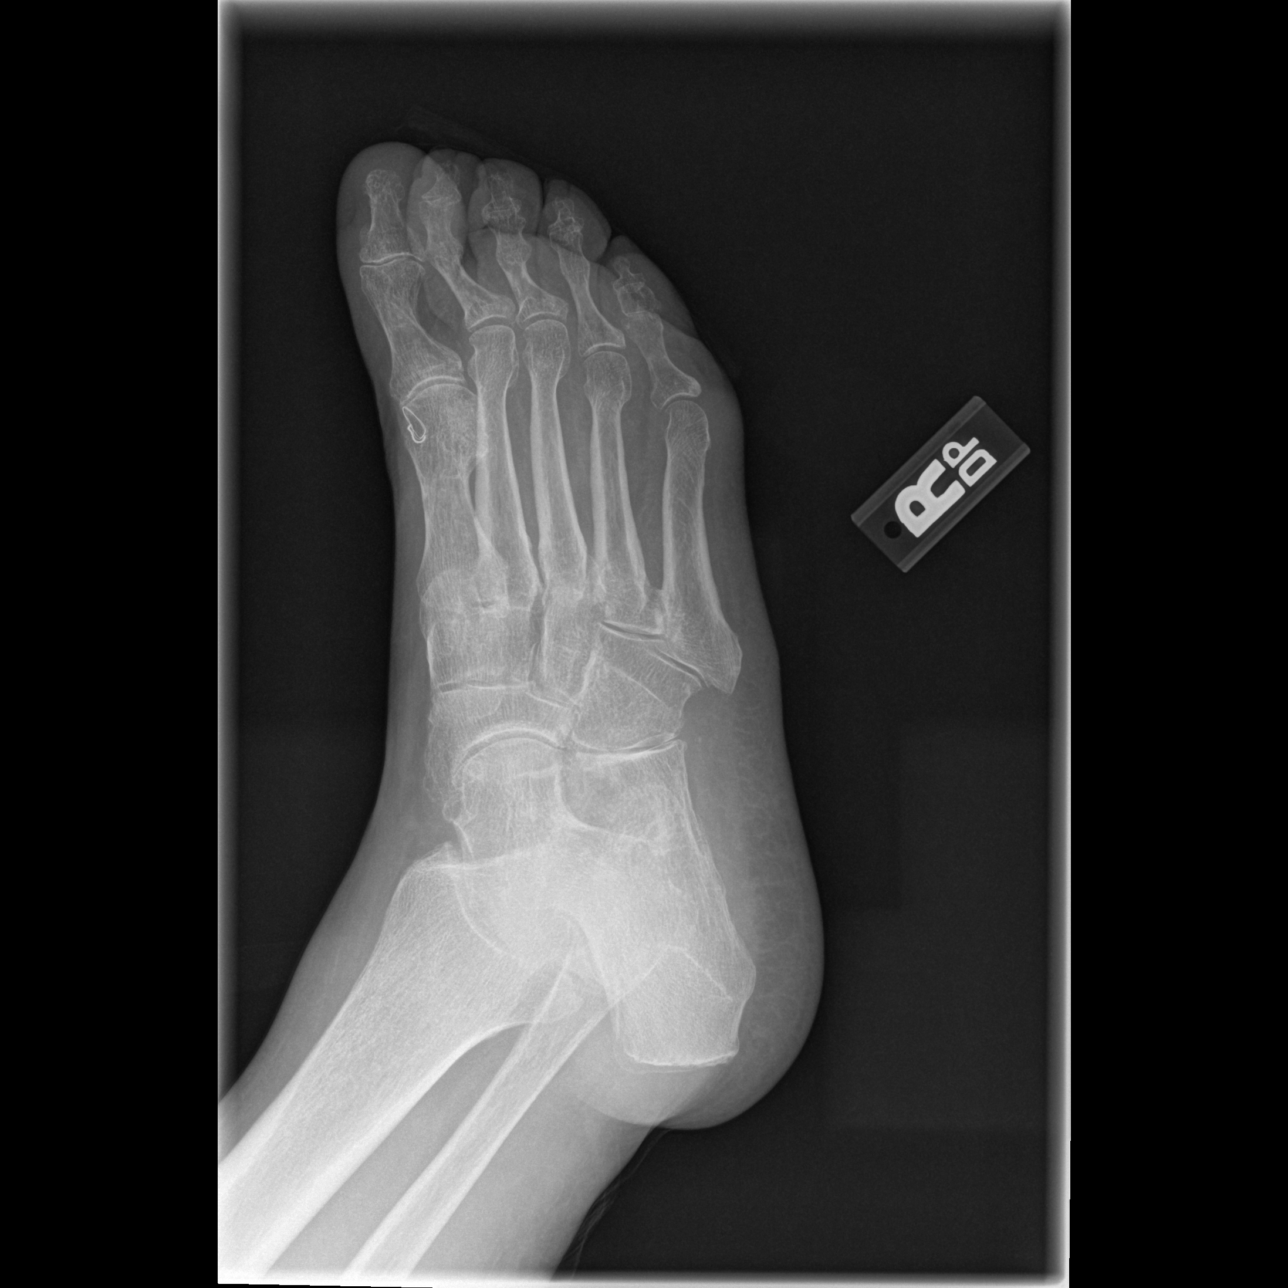

[t foot lat right]
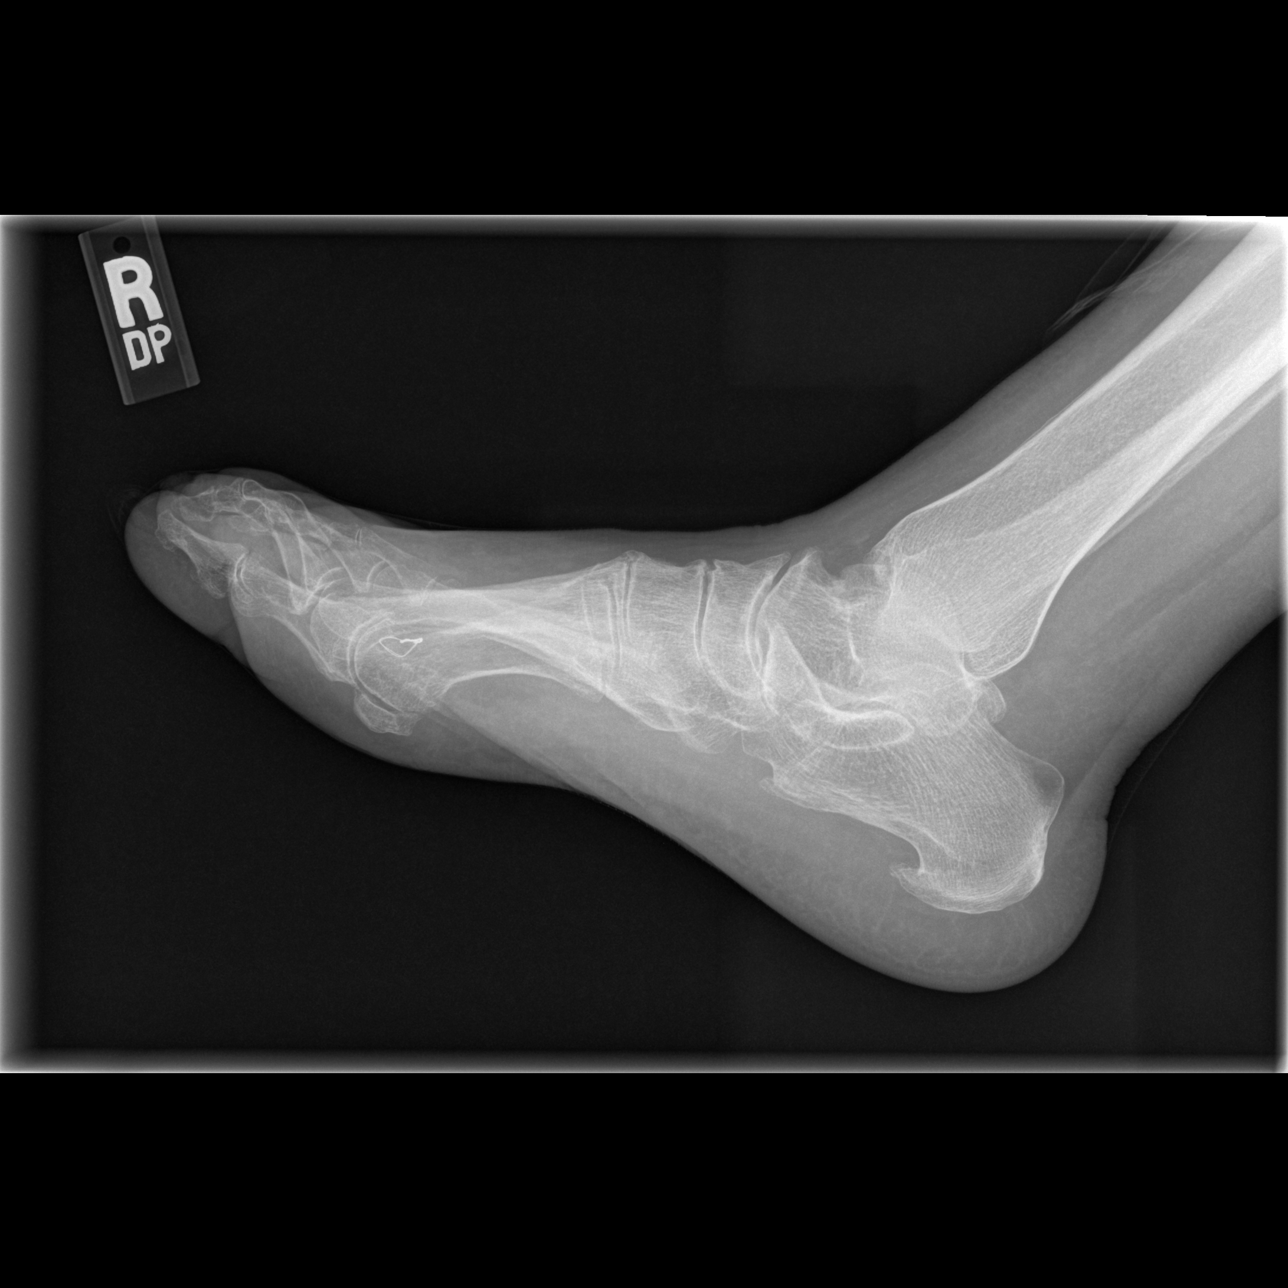

[3 of 3 positions shown; findings below may reference images not displayed]

FINDINGS: Changes of osteotomy noted in the right first metatarsal.
No acute bony abnormality.  No fracture, subluxation or
dislocation.
IMPRESSION: No acute bony abnormality.

## 2010-05-11 ENCOUNTER — Ambulatory Visit: Payer: Self-pay | Admitting: Family Medicine

## 2010-05-12 ENCOUNTER — Encounter: Payer: Self-pay | Admitting: Family Medicine

## 2010-05-12 ENCOUNTER — Inpatient Hospital Stay (HOSPITAL_COMMUNITY): Admission: EM | Admit: 2010-05-12 | Discharge: 2010-05-13 | Payer: Self-pay | Admitting: Emergency Medicine

## 2010-05-25 ENCOUNTER — Ambulatory Visit: Payer: Self-pay | Admitting: Family Medicine

## 2010-05-25 DIAGNOSIS — IMO0002 Reserved for concepts with insufficient information to code with codable children: Secondary | ICD-10-CM | POA: Insufficient documentation

## 2010-05-25 LAB — CONVERTED CEMR LAB
Hgb A1c MFr Bld: 10.6 %
INR: 2.5

## 2010-05-31 ENCOUNTER — Encounter: Payer: Self-pay | Admitting: Family Medicine

## 2010-06-01 ENCOUNTER — Telehealth: Payer: Self-pay | Admitting: *Deleted

## 2010-06-04 ENCOUNTER — Telehealth: Payer: Self-pay | Admitting: Family Medicine

## 2010-06-22 ENCOUNTER — Ambulatory Visit: Payer: Self-pay | Admitting: Family Medicine

## 2010-06-22 ENCOUNTER — Encounter: Payer: Self-pay | Admitting: Family Medicine

## 2010-06-22 LAB — CONVERTED CEMR LAB
BUN: 29 mg/dL — ABNORMAL HIGH (ref 6–23)
CO2: 25 meq/L (ref 19–32)
Calcium: 9.6 mg/dL (ref 8.4–10.5)
Chloride: 95 meq/L — ABNORMAL LOW (ref 96–112)
Creatinine, Ser: 1.18 mg/dL (ref 0.40–1.20)
Glucose, Bld: 371 mg/dL — ABNORMAL HIGH (ref 70–99)
INR: 4.1
Potassium: 3.8 meq/L (ref 3.5–5.3)
Sodium: 135 meq/L (ref 135–145)

## 2010-07-01 ENCOUNTER — Ambulatory Visit: Payer: Self-pay | Admitting: Family Medicine

## 2010-07-01 LAB — CONVERTED CEMR LAB: INR: 1.2

## 2010-07-06 ENCOUNTER — Ambulatory Visit: Payer: Self-pay | Admitting: Family Medicine

## 2010-07-06 ENCOUNTER — Encounter: Payer: Self-pay | Admitting: Family Medicine

## 2010-07-06 LAB — CONVERTED CEMR LAB
Cholesterol: 171 mg/dL (ref 0–200)
HDL: 44 mg/dL (ref >39)
LDL Cholesterol: 66 mg/dL (ref 0–99)
Total CHOL/HDL Ratio: 3.9
Triglycerides: 306 mg/dL — ABNORMAL HIGH (ref <150)
VLDL: 61 mg/dL — ABNORMAL HIGH (ref 0–40)

## 2010-07-13 ENCOUNTER — Ambulatory Visit: Payer: Self-pay | Admitting: Family Medicine

## 2010-07-13 ENCOUNTER — Encounter: Payer: Self-pay | Admitting: Family Medicine

## 2010-07-13 LAB — CONVERTED CEMR LAB: INR: 2.3

## 2010-07-24 ENCOUNTER — Telehealth: Payer: Self-pay | Admitting: Family Medicine

## 2010-07-27 ENCOUNTER — Ambulatory Visit: Payer: Self-pay

## 2010-07-29 ENCOUNTER — Observation Stay (HOSPITAL_COMMUNITY)
Admission: EM | Admit: 2010-07-29 | Discharge: 2010-07-31 | Payer: Self-pay | Source: Home / Self Care | Attending: Family Medicine | Admitting: Family Medicine

## 2010-07-29 ENCOUNTER — Encounter: Payer: Self-pay | Admitting: Family Medicine

## 2010-07-31 ENCOUNTER — Encounter: Payer: Self-pay | Admitting: Family Medicine

## 2010-08-18 ENCOUNTER — Ambulatory Visit: Admission: RE | Admit: 2010-08-18 | Discharge: 2010-08-18 | Payer: Self-pay | Source: Home / Self Care

## 2010-08-18 ENCOUNTER — Encounter: Payer: Self-pay | Admitting: Family Medicine

## 2010-08-18 DIAGNOSIS — M6282 Rhabdomyolysis: Secondary | ICD-10-CM | POA: Insufficient documentation

## 2010-08-18 LAB — CONVERTED CEMR LAB: INR: 2.4

## 2010-08-20 LAB — CONVERTED CEMR LAB
ALT: 32 units/L (ref 0–35)
AST: 24 units/L (ref 0–37)
Albumin: 3.7 g/dL (ref 3.5–5.2)
Alkaline Phosphatase: 107 units/L (ref 39–117)
BUN: 24 mg/dL — ABNORMAL HIGH (ref 6–23)
CO2: 26 meq/L (ref 19–32)
Calcium: 8.8 mg/dL (ref 8.4–10.5)
Chloride: 101 meq/L (ref 96–112)
Creatinine, Ser: 0.9 mg/dL (ref 0.40–1.20)
Glucose, Bld: 359 mg/dL — ABNORMAL HIGH (ref 70–99)
Potassium: 3.7 meq/L (ref 3.5–5.3)
Sodium: 140 meq/L (ref 135–145)
Total Bilirubin: 0.5 mg/dL (ref 0.3–1.2)
Total CK: 74 units/L (ref 7–177)
Total Protein: 5.8 g/dL — ABNORMAL LOW (ref 6.0–8.3)

## 2010-09-13 LAB — CONVERTED CEMR LAB
BUN: 23 mg/dL (ref 6–23)
CO2: 24 meq/L (ref 19–32)
Calcium: 9.7 mg/dL (ref 8.4–10.5)
Chloride: 100 meq/L (ref 96–112)
Creatinine, Ser: 0.9 mg/dL (ref 0.40–1.20)
Glucose, Bld: 248 mg/dL — ABNORMAL HIGH (ref 70–99)
Potassium: 3.5 meq/L (ref 3.5–5.3)
Pro B Natriuretic peptide (BNP): 59 pg/mL (ref 0.0–100.0)
Sodium: 143 meq/L (ref 135–145)

## 2010-09-15 ENCOUNTER — Ambulatory Visit: Admit: 2010-09-15 | Payer: Self-pay

## 2010-09-15 NOTE — Assessment & Plan Note (Signed)
Summary: fu/per love/el   Vital Signs:  Patient Profile:   60 Years Old Female Height:     61 inches Weight:      212.6 pounds BMI:     40.32 Temp:     98.3 degrees F Pulse rate:   102 / minute BP sitting:   139 / 74  (left arm)  Pt. in pain?   yes    Location:   knees    Intensity:   4  Vitals Entered By: Lupita Raider CMA, (February 27, 2007 9:38 AM)                PCP:  Dixon Boos MD  Chief Complaint:  office visit per love.  History of Present Illness: 60 y/o WF with multiple medical problems here for f/up of acute hypokalemia and hypotension on Friday.  1) Hypokalemia - SHe has increased her oral Kcl increased to 45meq two tabs QID from three times a day.  She is still on the spironolactone.  As below, unfortunately, they held the diovan instead of her hctz.  We don't yet have a magnesium on her.  No chest pain, shortness of breath, dizziness, syncope, weakness.   2) Hypotension - This has resolved as has her symptomatic dizziness and falls that she had last week.  Since holding the DIovan.  They decided to hold the diovan instead of the HCTZ that were in the D/C instructions because they were worried about fluid retention that has been an issue for her.  Per the patient she has had a normal cardiac workup including stress test and echocardiogram in the last year.       Review of Systems       see HPI   Physical Exam  General:     Well-developed,well-nourished,in no acute distress; alert,appropriate and cooperative throughout examination Lungs:     Normal respiratory effort, chest expands symmetrically. Lungs are clear to auscultation, no crackles or wheezes. Heart:     Normal rate and regular rhythm. S1 and S2 normal without gallop, murmur, click, rub or other extra sounds. Skin:     Intact without suspicious lesions or rashes Psych:     Cognition and judgment appear intact. Alert and cooperative with normal attention span and concentration. No apparent  delusions, illusions, hallucinations    Impression & Recommendations:  Problem # 1:  HYPOTENSION NOS (ICD-458.9) Assessment: Improved Resolved with holding of one blood pressure pill.  WIll have her restart the lisinopril and stop the HCTZ.  She can continue the lasix since she is so sensitive to peripheral edema. Orders: [place]- Est Level  3 [id number])   Problem # 2:  HYPOKALEMIA ([id number]) Assessment: Unchanged Check stat BMP and magnesium today.  We will determine new home dose of KCl based upon this. Orders: [facility]- Est Level  3 [id number]) Basic Met-[facility] [id number]) Magnesium-[facility] [id number])

## 2010-09-15 NOTE — Assessment & Plan Note (Signed)
Summary: FU/KH   Vital Signs:  Patient profile:   60 year old female Weight:      203 pounds Pulse rate:   92 / minute BP sitting:   130 / 80  (left arm)  Vitals Entered By: [name] CMA, (Jan 10, 2009 2:26 PM) CC: pt fell last week in bathroom.  Is Patient Diabetic? Yes  Pain Assessment Patient in pain? no        Primary Care Provider:  Dixon Boos MD  CC:  pt fell last week in bathroom. [name] Kitchen  History of Present Illness: 60yo with:  1. 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  Followed up at [facility] with CXR that showed improvement in pleural effusion and she has f/u with Dr. Aundra Dubin in 3 months and repeat 2D echo schedule.  No longer having CP or dyspnea.  Symptoms completely resolved.  2. fall- fell in bathroom last week.  Slept in floor for 1 hour before she knew that she had fallen.  Has b/l black eyes.  Has not had any residual confusion or neurologic symptoms.  3.  HTN- diovan stopped in hospital b/c SBP running 80-90s.    4.  joint pain and myalgias- followed by Dr. Charlestine Night for ? gout vs arthritis.  Back on prednisone 5mg  and MTX which has not changed her pain.  Hurts everywhere including her forearms.  Skin feels like it hurts all over.  B/L LEs ache which is a chronic issue for her.  5.  DM2- taking lantus 40 units in addition to glucotrol and metformin.  Fasting CBG was 140 this AM.    Habits & Providers  Alcohol-Tobacco-Diet     Tobacco Status: quit     Year Quit: 1973  Allergies: 1)  ! Codeine 2)  ! Penicillin 3)  ! Sulfa 4)  ! * Lithium 5)  ! Valium 6)  ! * Cogentin  Past History:  Past Medical History: amenorrhea since 61 years old Barrets Esophagus - EGD 8/07. (Repeat 2-3 years) CA-125 - normal 11/28/2000 CBC wnl and Neg Rheum w/u and neg CCP Ab (8/06) Nephrolithiasis 2/03 stroke at age 83???- pt's neice says this is untrue Colonoscopy- benign polyps (12/2000), repeat colonoscopy performed  in 2007 echo-5/05-poor study, grossly nL  Endometrial biopsy 10/03 benign columnar mucosa (limited material) stress test 04/2006- negative per patient  PFT's (10/14/06)- mild obstructive airway dz, poor effort hospitalized for acute PE (11/2008)- will need 6 months of coumadin.  1st documented episode although pt states she has had 3 other blood clots in her lungs, pt's neice claims she is making this up. 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 [facility])  Social History: Smoking Status:  quit  Physical Exam  General:  alert, well-developed, well-nourished, and well-hydrated.   Head:  b/l black eyes, no scalp abnormality.  Chest Wall:  no tenderness to palpation Lungs:  Normal respiratory effort, chest expands symmetrically. Lungs are clear to auscultation, no crackles or wheezes. Heart:  Normal rate and regular rhythm. S1 and S2 normal without gallop, murmur, click, rub or other extra sounds. Extremities:  wearing compression stockings, legs tender to palpation b/l Neurologic:  alert & oriented X3.   Psych:  normally interactive.     Impression & Recommendations:  Problem # 1:  ACCIDENTAL FALL, HX OF (ICD-V15.89) as this occured 1 week ago and has not had any residual neurological symptoms, will defer head imaging. Discussed this with Dr. Erin Hearing. Advised pt and  neice if she hits her head in the future (Resulting in passing out!) she shoudl seek medical care given that she is on coumadin. Orders: [place]- Est  Level 4 [id number])  Problem # 2:  PERICARDITIS (ICD-423.9) resolved, has f/u echo in 1 month and appt with cardiology in 2 months. no longer taking nsaids. Orders: [place]- Est  Level 4 [id number])  Problem # 3:  COUMADIN THERAPY (ICD-V58.61) INR therapeutic. f/u for INR in 1 month plan coumadin therapy for 6 months after PE. Orders: INR/PT-[facility] [id number])  Problem # 4:  DIABETES MELLITUS II, UNCOMPLICATED ([id number]) CBGs improved since  starting insulin in March.  A1C 10.9% -> 9%. Her updated medication list for this problem includes:    Glipizide 10 Mg Tb24 (Glipizide) .[name] Kitchen... Take 1 tablet by mouth once a day    Metformin Hcl 500 Mg Tabs (Metformin hcl) .[name] Kitchen... Take 1 tablet by mouth twice a day    Lantus 100 Unit/ml Soln (Insulin glargine) .[name] KitchenMarland KitchenMarland KitchenMarland Kitchen 40 units daily at bedtime  Orders: A1C-[facility] [id number]) [facility]- Est  Level 4 [id number])  Complete Medication List: 1)  Advair Diskus 500-50 Mcg/dose Misc (Fluticasone-salmeterol) .[name] Kitchen.. 1 puff inh two times a day 2)  Albuterol 90 Mcg/act Aers (Albuterol) .... Inhale 1 puff using inhaler four times a day 3)  Allopurinol 300 Mg Tabs (Allopurinol) .... Take 2  tablet by mouth twice a day per dr trueslow 4)  Ascensia Elite Test Strp (Glucose blood) .... Test blood sugar twice daily 5)  Darvocet-n 100 100-650 Mg Tabs (Propoxyphene n-apap) .... Take 1 tablet by mouth every six hours- needs appointment before any more refills 6)  Doxepin Hcl 100 Mg Caps (Doxepin hcl) .... Take 1 capsule by mouth at bedtime 7)  Epipen 2-pak 0.3 Mg/0.6ml (1:1000) Devi (Epinephrine hcl (anaphylaxis)) .... Inject 1 pen subcutaneously as directed 8)  Glipizide 10 Mg Tb24 (Glipizide) .... Take 1 tablet by mouth once a day 9)  Hydrochlorothiazide 25 Mg Tabs (Hydrochlorothiazide) .... Take 1 tablet by mouth twice a day 10)  Lasix 80 Mg Tabs (Furosemide) .... Take 1 tablet by mouth twice a day 11)  Lexapro 10 Mg Tabs (Escitalopram oxalate) .... Take 1 tablet by mouth once a day 12)  Metformin Hcl 500 Mg Tabs (Metformin hcl) .... Take 1 tablet by mouth twice a day 13)  Potassium Chloride Crys Cr 20 Meq Tbcr (Potassium chloride crys cr) .... Take 3 tablet by mouth twice times a day and 2 tablets once daily 14)  Ranitidine Hcl 300 Mg Caps (Ranitidine hcl) .... Take 1 capsule by mouth once a day 15)  Spironolactone 50 Mg Tabs (Spironolactone) .... Take 1 tablet by mouth once a day 16)  Zocor 10 Mg Tabs (Simvastatin) .... Take 1  tablet by mouth once a day 17)  Nexium 40 Mg Cpdr (Esomeprazole magnesium) .[name] Kitchen.. 1 tablet by mouth twice daily 18)  Calcium 500 500 Mg Tabs (Calcium carbonate) .[name] Kitchen.. 1 tablet by mouth daily 19)  Methotrexate 2.5 Mg Tabs (Methotrexate sodium) .... Take as directed by dr. Charlestine Night 20)  Oxybutynin Chloride 5 Mg Tabs (Oxybutynin chloride) .[name] Kitchen.. 1 tablet by mouth two times a day as directed 21)  Lyrica 100 Mg Caps (Pregabalin) .[name] Kitchen.. 1 tab by mouth three times a day 22)  Lantus 100 Unit/ml Soln (Insulin glargine) .... 40 units daily at bedtime 23)  Warfarin Sodium 5 Mg Tabs (Warfarin sodium) .... Take as directed 24)  Folic Acid 1 Mg Tabs (Folic acid) .[name] Kitchen.. 1 tablet by mouth daily while on methotrexate  25)  Clonidine Hcl 0.2 Mg Tabs (Clonidine hcl) .... 2 by mouth at bedtime for depression 26)  Amitriptyline Hcl 25 Mg Tabs (Amitriptyline hcl) .... Take one tablet at bedtime 27)  Prednisone 5 Mg Tabs (Prednisone) .[name] Kitchen.. 1 daily by dr Jolee Ewing 28)  Ferrous Sulfate 325 (65 Fe) Mg Tbec (Ferrous sulfate) .... 3x per day 29)  Doxepin Hcl 25 Mg Caps (Doxepin hcl) .... Take 1 capsule by mouth once a day at bedtime 30)  Propoxyphene N-apap 100-650 Mg Tabs (Propoxyphene n-apap) .... At bedtime  Patient Instructions: 1)  Come back in 1 month for a coumadin check and to see Dr. Ronnald Ramp. 2)  No new changes to your medicines.    Laboratory Results   Blood Tests   Date/Time Received: Jan 10, 2009 2:31 PM  Date/Time Reported: Jan 10, 2009 3:42 PM   HGBA1C: 9.0%   (Normal Range: Non-Diabetic - 3-6%   Control Diabetic - 6-8%)  INR: 2.1   (Normal Range: 0.88-1.12   Therap INR: 2.0-3.5) Comments: ...............test performed by......[name] KitchenBonnie A. [place], [place] (ASCP)       ANTICOAGULATION RECORD PREVIOUS REGIMEN & LAB RESULTS Anticoagulation Diagnosis:  PE on  11/22/2008 Previous INR Goal Range:  2-3 on  11/22/2008 Previous INR:  1.9 on  01/02/2009 Previous Coumadin Dose(mg):  3MG  TABLETS on   11/22/2008 Previous Regimen:  5mg  Mon & Thur; 2.5mg  other days on  01/02/2009 Previous Coagulation Comments:  f/u hospital and waiting for INR to be low enough to drain fluid from lungs on  12/27/2008  NEW REGIMEN & LAB RESULTS Current INR: 2.1 Regimen: continue same:  5 mg - Mon & Thurs;  2.5 mg - other days  Repeat testing in: 4 weeks Other Comments: ...............test performed by......[name] KitchenBonnie A. [place], [place] (ASCP)   Dose has been reviewed with patient or caretaker during this visit. Reviewed by: Dr. Ronnald Ramp

## 2010-09-15 NOTE — Assessment & Plan Note (Signed)
Summary: Med Review - Rx Clinic   Vital Signs:  Patient profile:   60 year old female Weight:      221 pounds Pulse rate:   88 / minute BP sitting:   97 / 63  (right arm)  Primary Care Provider:  Briscoe Deutscher [id number]   History of Present Illness: Diabetes:  Uncontrolled with CBGs in the 500s.  However, February CBGs improved from [place].  Currently on Lantus 50 units daily and Novolog 15 units three times daily.  Pt also on Prednisone 5 mg for > 6 months for gout; clinic not informed of prednisone use.  Consider increasing Lantus to 55 units daily at next visit.  Advised to exercise as much as possible.    Edema/hypertension:  Spironolactone was increased to 100 mg and HCTZ was d/c'ed in the hospital due to low postassium observed prior to discharge.  Pt gained  ~10 lbs since hospitalization and presented with 1+ edema today.    Other Meds Issues:  Pt denies problems with stopping oxybutynin and amitriptyline!  Pt on hydrocodone-APAP as needed for pain.  Been taking  ~10 doses per week.  May need to discuss further pain management.         Current Medications (verified): 1)  Advair Diskus 500-50 Mcg/dose Misc (Fluticasone-Salmeterol) .[name] Kitchen.. 1 Puff Inh Two Times A Day 2)  Allopurinol 300 Mg Tabs (Allopurinol) .... Take 1  Tablet By Mouth Twice A Day Per Dr Jolee Ewing 3)  Ascensia Elite Test  Strp (Glucose Blood) .... Test Blood Sugarfour Times Daily 4)  Doxepin Hcl 100 Mg Caps (Doxepin Hcl) .... Take 1 Capsule By Mouth At Bedtime 5)  Epipen 2-Pak 0.3 Mg/0.48ml (1:1000) Devi (Epinephrine Hcl (Anaphylaxis)) .... Inject 1 Pen Subcutaneously As Directed 6)  Lasix 80 Mg Tabs (Furosemide) .... Take 1 Tablet By Mouth Twice A Day 7)  Lexapro 10 Mg Tabs (Escitalopram Oxalate) .... Take 1 Tablet By Mouth Once A Day 8)  Metformin Hcl 500 Mg Tabs (Metformin Hcl) .... Take 1 Tablet By Mouth Twice A Day 9)  Potassium Chloride Crys Cr 20 Meq Tbcr (Potassium Chloride Crys Cr) .... Take 4  Tabs  By Mouth in The Am, 4 At Noon, and 4 At [facility] 10)  Ranitidine Hcl 300 Mg Caps (Ranitidine Hcl) .... Take 1 Capsule By Mouth Once A Day 11)  Zocor 10 Mg Tabs (Simvastatin) .... Take 1 Tablet By Mouth Once A Day 12)  Nexium 40 Mg  Cpdr (Esomeprazole Magnesium) .[name] Kitchen.. 1 Tablet By Mouth Twice Daily 13)  Calcium 500 500 Mg  Tabs (Calcium Carbonate) .[name] Kitchen.. 1 Tablet By Mouth Daily 14)  Methotrexate 2.5 Mg  Tabs (Methotrexate Sodium) .... Take As Directed By Dr. Charlestine Night 12.5 Mg (5 Pills) On Saturday and Sunday 15)  Lyrica 100 Mg  Caps (Pregabalin) .[name] Kitchen.. 1 Tab By Mouth Three Times A Day 16)  Lantus 100 Unit/ml Soln (Insulin Glargine) .... 50 Units Daily At Bedtime. Dispense One Month Supply. 17)  Warfarin Sodium 5 Mg Tabs (Warfarin Sodium) .... Take As Directed 18)  Folic Acid 1 Mg Tabs (Folic Acid) .[name] Kitchen.. 1 Tablet By Mouth Daily While On Methotrexate 19)  Clonidine Hcl 0.2 Mg Tabs (Clonidine Hcl) .... 2 By Mouth At Bedtime For Depression 20)  Doxepin Hcl 25 Mg Caps (Doxepin Hcl) .... Take 1 Capsule By Mouth Once A Day At Bedtime 21)  Iron 325 (65 Fe) Mg Tabs (Ferrous Sulfate) .... One By Mouth Three Times A Day 22)  Novolog 100 Unit/ml Soln (  Insulin Aspart) .[name] Kitchen.. 15 Units Subcutaneous Injection  Prior To Breakfast and Lunch and Eveing Meal.  Disp: 1 Month Supply. 23)  Vitamin D 1000 Unit Tabs (Cholecalciferol) .... Take 1 Tabs Daily 24)  Bd Insulin Syringe Ultrafine 30g X 1/2" 1 Ml Misc (Insulin Syringe-Needle U-100) .... Dispense Supply For Four Times Per Day Dosing of Insulin. 25)  Hydrocodone-Acetaminophen 5-325 Mg Tabs (Hydrocodone-Acetaminophen) .[name] Kitchen.. 1 By Mouth Up To 4 Times Per Day As Needed For Pain 26)  Ventolin Hfa 108 (90 Base) Mcg/act Aers (Albuterol Sulfate) .... Take One Puff Q4 Hours. 27)  Spironolactone 100 Mg Tabs (Spironolactone) .... Once Daily in Am  Allergies (verified): 1)  ! Codeine 2)  ! Penicillin 3)  ! Sulfa 4)  ! * Lithium 5)  ! Valium 6)  ! * Cogentin 7)  ! Lisinopril  (Lisinopril) 8)  ! * Hyddal 9)  ! Diphenhydramine Hcl (Diphenhydramine Hcl) 10)  Thioridazine Hcl (Thioridazine Hcl)   Impression & Recommendations:  Problem # 1:  HYPERTENSION, BENIGN SYSTEMIC (ICD-401.1) Assessment Deteriorated  Pt recently hospitalized.  Found with low potassium at time of discharge so HCTZ was discontinued. Been on spironolactone 50, which was recently increased to 100 daily.  Since hospitalization, experienced  ~10 lbs weight gain and 1+ edema this AM.  HCTZ restarted UNTIL morning of office visit (09/24/09).  Consider increasing Lasix dose since been on current dose for a long period of time.   F/U Rx Clinic Visit: TBD by Dr. Juleen China - likely appropriate to alternate visits for the next few months.    TTFFC:   45 mins.  Pt seen with: Vanessa  PharmD Candidate  The following medications were removed from the medication list:    Spironolactone 50 Mg Tabs (Spironolactone) .[name] Kitchen... Take 1 tablet daily in the morning    Hydrochlorothiazide 25 Mg Tabs (Hydrochlorothiazide) .[name] Kitchen... 1 tab two times a day Her updated medication list for this problem includes:    Lasix 80 Mg Tabs (Furosemide) .[name] Kitchen... Take 1 tablet by mouth twice a day    Clonidine Hcl 0.2 Mg Tabs (Clonidine hcl) .[name] Kitchen... 2 by mouth at bedtime for depression    Spironolactone 100 Mg Tabs (Spironolactone) ..... Once daily in am  Orders: Inital Assessment Each 110min - [place] [phone])  Problem # 2:  DIABETES MELLITUS, TYPE I, UNCONTROLLED (ICD-250.03) Assessment: Improved  Diabetes of: many yrs duration currently under: poor but improved control of blood glucose time of recent  A1C of: 10.3 (08/2009),  fasting CBGs of: as low as  ~100 and random readings as high as 500.  However recent numbers are averaging 250:  Control is suboptimal due to: use of Prednisone and sedentary lifestyle.  Denies hypoglycemic events.  Able to verbalize appropriate hypoglycemia management plan.  Continued basal insulin Lantus today but suggest  increase to 55 units at next visit with Dr. Juleen China (09/24/2009) . Written pt instructions provided:    Her updated medication list for this problem includes:    Metformin Hcl 500 Mg Tabs (Metformin hcl) .[name] Kitchen... Take 1 tablet by mouth twice a day    Lantus 100 Unit/ml Soln (Insulin glargine) .[name] KitchenMarland KitchenMarland KitchenMarland Kitchen 50 units daily at bedtime. dispense one month supply.    Novolog 100 Unit/ml Soln (Insulin aspart) .[name] KitchenMarland KitchenMarland KitchenMarland Kitchen 15 units subcutaneous injection  prior to breakfast and lunch and eveing meal.  disp: 1 month supply.  Orders: Inital Assessment Each 75min - [place] [phone])  Problem # 3:  GOUT, UNSPECIFIED (ICD-274.9) Assessment: Unchanged  Continue Allopurinol, Methotrexate and Predisone.  Continue to follow up with Dr. Jolee Ewing.  Possibly consider alternative therapy such as Uloric instead of long-term prednisone and Switch from HCTZ if possible.  HIgher dose of loop diuretic and aldosterone diuretic may be sufficient.     Her updated medication list for this problem includes:    Allopurinol 300 Mg Tabs (Allopurinol) .[name] Kitchen... Take 1  tablet by mouth twice a day per dr trueslow  Orders: Inital Assessment Each 88min - [place] [phone])  Problem # 4:  INCONTINENCE, URGE (ICD-788.31) Assessment: Unchanged  Oxybutynin recently discontinued.  Pt still complains of urgency and polyuria secondary to diuretics and uncontrolled diabetes.  Reevaluate when glycemic control is improved.   Orders: Inital Assessment Each 32min - [place] [phone])  Complete Medication List: 1)  Advair Diskus 500-50 Mcg/dose Misc (Fluticasone-salmeterol) .[name] Kitchen.. 1 puff inh two times a day 2)  Allopurinol 300 Mg Tabs (Allopurinol) .... Take 1  tablet by mouth twice a day per dr trueslow 3)  Ascensia Elite Test Strp (Glucose blood) .... Test blood sugarfour times daily 4)  Doxepin Hcl 100 Mg Caps (Doxepin hcl) .... Take 1 capsule by mouth at bedtime 5)  Epipen 2-pak 0.3 Mg/0.40ml (1:1000) Devi (Epinephrine hcl (anaphylaxis)) .... Inject 1 pen subcutaneously as  directed 6)  Lasix 80 Mg Tabs (Furosemide) .... Take 1 tablet by mouth twice a day 7)  Lexapro 10 Mg Tabs (Escitalopram oxalate) .... Take 1 tablet by mouth once a day 8)  Metformin Hcl 500 Mg Tabs (Metformin hcl) .... Take 1 tablet by mouth twice a day 9)  Potassium Chloride Crys Cr 20 Meq Tbcr (Potassium chloride crys cr) .... Take 4  tabs by mouth in the am, 4 at noon, and 4 at hs 10)  Ranitidine Hcl 300 Mg Caps (Ranitidine hcl) .... Take 1 capsule by mouth once a day 11)  Zocor 10 Mg Tabs (Simvastatin) .... Take 1 tablet by mouth once a day 12)  Nexium 40 Mg Cpdr (Esomeprazole magnesium) .[name] Kitchen.. 1 tablet by mouth twice daily 13)  Calcium 500 500 Mg Tabs (Calcium carbonate) .[name] Kitchen.. 1 tablet by mouth daily 14)  Methotrexate 2.5 Mg Tabs (Methotrexate sodium) .... Take as directed by dr. Charlestine Night 12.5 mg (5 pills) on saturday and sunday 15)  Lyrica 100 Mg Caps (Pregabalin) .[name] Kitchen.. 1 tab by mouth three times a day 16)  Lantus 100 Unit/ml Soln (Insulin glargine) .... 50 units daily at bedtime. dispense one month supply. 17)  Warfarin Sodium 5 Mg Tabs (Warfarin sodium) .... Take as directed 18)  Folic Acid 1 Mg Tabs (Folic acid) .[name] Kitchen.. 1 tablet by mouth daily while on methotrexate 19)  Clonidine Hcl 0.2 Mg Tabs (Clonidine hcl) .... 2 by mouth at bedtime for depression 20)  Doxepin Hcl 25 Mg Caps (Doxepin hcl) .... Take 1 capsule by mouth once a day at bedtime 21)  Iron 325 (65 Fe) Mg Tabs (Ferrous sulfate) .... One by mouth three times a day 22)  Novolog 100 Unit/ml Soln (Insulin aspart) .[name] Kitchen.. 15 units subcutaneous injection  prior to breakfast and lunch and eveing meal.  disp: 1 month supply. 23)  Vitamin D 1000 Unit Tabs (Cholecalciferol) .... Take 1 tabs daily 24)  Bd Insulin Syringe Ultrafine 30g X 1/2" 1 Ml Misc (Insulin syringe-needle u-100) .... Dispense supply for four times per day dosing of insulin. 25)  Hydrocodone-acetaminophen 5-325 Mg Tabs (Hydrocodone-acetaminophen) .[name] Kitchen.. 1 by mouth up to 4 times  per day as needed for pain 26)  Ventolin Hfa 108 (90 Base) Mcg/act Aers (Albuterol  sulfate) .... Take one puff q4 hours. 27)  Spironolactone 100 Mg Tabs (Spironolactone) .... Once daily in am 28)  Prednisone 5 Mg Tabs (Prednisone) .... Once daily  Patient Instructions: 1)  Restart HCTZ 25 mg today take a dose as soon as you get home and a second dose this evening.  Take one additional dose in the morning prior to your visit tomorrow with Dr. Juleen China.  2)  Exercise as much as possible.  Prescriptions: PREDNISONE 5 MG TABS (PREDNISONE) once daily  #1 x 0   Entered and Authorized by:   Rinaldo Ratel D   Signed by:   Janeann Forehand Pharm D on 09/23/2009   Method used:   Historical   [id number] NOVOLOG 100 UNIT/ML SOLN (INSULIN ASPART) 15 units subcutaneous injection  prior to breakfast and lunch and eveing meal.  Disp: 1 month supply.  #1 x 0   Entered and Authorized by:   Rinaldo Ratel D   Signed by:   Janeann Forehand Pharm D on 09/23/2009   Method used:   Historical   [id number] SPIRONOLACTONE 100 MG TABS (SPIRONOLACTONE) once daily in AM  #1 x 0   Entered and Authorized by:   Rinaldo Ratel D   Signed by:   Janeann Forehand Pharm D on 09/23/2009   Method used:   Historical   [id number] VENTOLIN HFA 108 (90 BASE) MCG/ACT AERS (ALBUTEROL SULFATE) Take one puff Q4 hours.  #1 x 0   Entered and Authorized by:   Rinaldo Ratel D   Signed by:   Janeann Forehand Pharm D on 09/23/2009   Method used:   Historical   [id number] HYDROCODONE-ACETAMINOPHEN 5-325 MG TABS (HYDROCODONE-ACETAMINOPHEN) 1 by mouth up to 4 times per day as needed for pain  #1 x 0   Entered and Authorized by:   Rinaldo Ratel D   Signed by:   Janeann Forehand Pharm D on 09/23/2009   Method used:   Historical   [id number] METHOTREXATE 2.5 MG  TABS (METHOTREXATE SODIUM) take as directed by Dr. Charlestine Night 12.5 mg (5 pills) on Saturday and Sunday  #1 x 0   Entered  and Authorized by:   Rinaldo Ratel D   Signed by:   Janeann Forehand Pharm D on 09/23/2009   Method used:   Historical   [id number]   Prevention & Chronic Care Immunizations   Influenza vaccine: Fluvax 3+  (05/21/2009)   Influenza vaccine due: 06/19/2008    Tetanus booster: 08/16/2001: Done.   Tetanus booster due: 08/17/2011    Pneumococcal vaccine: Done.  (09/16/2004)   Pneumococcal vaccine due: None  Colorectal Screening   Hemoccult: Done.  (12/14/2000)   Hemoccult due: Not Indicated    Colonoscopy: Done.  (03/16/2006)   Colonoscopy due: 03/16/2016  Other Screening   Pap smear: Done.  (08/16/2006)   Pap smear action/deferral: Deferred  (05/21/2009)   Pap smear due: 08/16/2009    Mammogram: ASSESSMENT: Negative - BI-RADS 1^MM DIGITAL SCREENING  (03/25/2009)   Mammogram action/deferral: Not indicated  (09/11/2009)   Mammogram due: 03/25/2010   Smoking status: quit > 6 months  (09/11/2009)  Diabetes Mellitus   HgbA1C: 10.3  (08/18/2009)   Hemoglobin A1C due: 09/30/2007    Eye exam: Not documented    Foot exam: yes  (07/04/2008)   Foot exam action/deferral: Do today   High risk foot: Yes  (09/11/2009)  Foot care education: Done  (09/11/2009)   Foot exam due: 05/03/2008    Urine microalbumin/creatinine ratio: Not documented    Diabetes flowsheet reviewed?: Yes   Progress toward A1C goal: Unchanged  Lipids   Total Cholesterol: 169  (06/30/2009)   LDL: 49  (06/30/2009)   LDL Direct: 96  (02/28/2008)   HDL: 43  (06/30/2009)   Triglycerides: 384  (06/30/2009)    SGOT (AST): 19  (05/21/2009)   SGPT (ALT): 39  (05/21/2009)   Alkaline phosphatase: 99  (05/21/2009)   Total bilirubin: 0.5  (05/21/2009)    Lipid flowsheet reviewed?: Yes   Progress toward LDL goal: At goal  Hypertension   Last Blood Pressure: 97 / 63  (09/23/2009)   Serum creatinine: 0.90  (08/29/2009)   Serum potassium 3.5  (08/29/2009)    Hypertension flowsheet reviewed?:  Yes   Progress toward BP goal: At goal  Self-Management Support :   Personal Goals (by the next clinic visit) :     Personal A1C goal: 8  (07/13/2009)     Personal blood pressure goal: 130/80  (05/21/2009)     Personal LDL goal: 100  (05/21/2009)    Diabetes self-management support: Written self-care plan  (09/23/2009)   Diabetes care plan printed    Hypertension self-management support: Written self-care plan  (09/11/2009)    Hypertension self-management support not done because: Good outcomes  (09/23/2009)    Lipid self-management support: Written self-care plan  (09/11/2009)     Lipid self-management support not done because: Good outcomes  (09/23/2009)

## 2010-09-15 NOTE — Consult Note (Signed)
Summary: Dr. Charlestine Night  Dr. Charlestine Night   Imported By: Loree Fee KIVETT 12/16/2006 14:35:54  _____________________________________________________________________  External Attachment:    Type:   Image     Comment:   External Document

## 2010-09-15 NOTE — Assessment & Plan Note (Signed)
Summary: FU and patient summary   Vital Signs:  Patient profile:   60 year old female Height:      60 inches Weight:      207.19 pounds BMI:     40.61 Temp:     97.8 degrees F oral Pulse rate:   102 / minute Pulse rhythm:   regular BP sitting:   137 / 77  (right arm)  Vitals Entered By: Janeth Rase LPN (June 30, [id number] AM) History of Present Illness Chief Complaint: Follow up visit for DM.   CC: Follow up visit for DM. Is Patient Diabetic? Yes  Pain Assessment Patient in pain? no        Primary Care Provider:  Dixon Boos MD  CC:  Follow up visit for DM.[name] Kitchen  History of Present Illness: 1.  DM2- taking lantus 40 units.  CBGs in mid- 100s even despite taking prednisone 5mg  from Dr. Charlestine Night.  No hypoglycemic episodes.    2.  pericarditis- no recurrence of CP or difficulty breathing.  Has appt for repeat echo in 1 month to ensure resolution.   3.  PE- on coumadin for 6 months after recent PE.  No abnormal bleeding.   4.    Allergies: 1)  ! Codeine 2)  ! Penicillin 3)  ! Sulfa 4)  ! * Lithium 5)  ! Valium 6)  ! * Cogentin  Past History:  Past Medical History: amenorrhea since 60 years old Barrets Esophagus - EGD 8/07. (Repeat 2-3 years) CA-125 - normal 11/28/2000 CBC wnl and Neg Rheum w/u and neg CCP Ab (8/06) Nephrolithiasis 2/03 stroke at age 62???- pt's neice says this is untrue Colonoscopy- benign polyps (12/2000), repeat colonoscopy performed in 2007 echo-5/05-poor study, grossly nL  Endometrial biopsy 10/03 benign columnar mucosa (limited material) stress test 04/2006- negative per patient  PFT's (10/14/06)- mild obstructive airway dz, poor effort hospitalized for acute PE (11/2008)- will need 6 months of coumadin.  1st documented episode although pt states she has had 3 other blood clots in her lungs, pt's neice claims she is making this up. 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 sleep apnea- on CPAP     Physical Exam  General:  alert, well-developed, and well-nourished. Cushingoid appearance Lungs:  Normal respiratory effort, chest expands symmetrically. Lungs are clear to auscultation, no crackles or wheezes. Heart:  Normal rate and regular rhythm. S1 and S2 normal without gallop, murmur, click, rub or other extra sounds. Abdomen:  obese Extremities:  compression stockings on, no pedal edema Neurologic:  alert & oriented X3.   Psych:  normally interactive and good eye contact.    Assessment  Assessed DIABETES MELLITUS II, UNCOMPLICATED as comment only - Dixon Boos MD Assessed PULMONARY EMBOLISM as comment only - Dixon Boos MD Assessed HYPERTENSION, BENIGN SYSTEMIC as comment only - Dixon Boos MD Assessed VENOUS INSUFFICIENCY, CHRONIC as comment only - Dixon Boos MD Assessed PERICARDITIS as comment only - Dixon Boos MD Assessed SLEEP APNEA as comment only - Dixon Boos MD Assessed REFLUX ESOPHAGITIS as comment only - Dixon Boos MD Assessed GOUT, UNSPECIFIED as comment only - Dixon Boos MD  Plan New Orders: [facility]- Est Level  3 [phone]  The patient and/or caregiver has been counseled thoroughly with regard to medications prescribed including dosage, schedule, interactions, rationale for use, and possible side effects and they verbalize understanding.  Diagnoses and expected course of recovery discussed and will return if not improved as expected or if the condition  worsens. Patient and/or caregiver verbalized understanding.    Impression & Recommendations:  Problem # 1:  DIABETES MELLITUS II, UNCOMPLICATED ([id number]) increase lantus from 40 to 42 units for tighter control. Her updated medication list for this problem includes:    Glipizide 10 Mg Tb24 (Glipizide) .[name] Kitchen... Take 1 tablet by mouth once a day    Metformin Hcl 500 Mg Tabs (Metformin hcl) .[name] Kitchen... Take 1 tablet by mouth twice a day    Lantus 100 Unit/ml Soln (Insulin glargine) .[name] Kitchen... 42 units daily at bedtime  Orders: [place]-  Est Level  3 [id number])  Problem # 2:  PULMONARY EMBOLISM (ICD-415.19) plan for 6 months of coumadin- stop 05/24/2009. Her updated medication list for this problem includes:    Warfarin Sodium 5 Mg Tabs (Warfarin sodium) .[name] Kitchen... Take as directed  Problem # 3:  HYPERTENSION, BENIGN SYSTEMIC (ICD-401.1) well controlled- diovan stopped in hospital for relative hypotension Her updated medication list for this problem includes:    Hydrochlorothiazide 25 Mg Tabs (Hydrochlorothiazide) .[name] Kitchen... Take 1 tablet by mouth twice a day    Lasix 80 Mg Tabs (Furosemide) .[name] Kitchen... Take 1 tablet by mouth twice a day    Spironolactone 50 Mg Tabs (Spironolactone) .[name] Kitchen... Take 1 tablet by mouth once a day    Clonidine Hcl 0.2 Mg Tabs (Clonidine hcl) .[name] Kitchen... 2 by mouth at bedtime for depression  Problem # 4:  VENOUS INSUFFICIENCY, CHRONIC (ICD-459.81) improved with compression stockings  Problem # 5:  PERICARDITIS (ICD-423.9) f/u echo in 1 month.  Folllowed by Dr. Aundra Dubin.  Problem # 6:  SLEEP APNEA (ICD-780.57) using cpap.  Problem # 7:  REFLUX ESOPHAGITIS (ICD-530.11) controlled with nexium AND ranitidine combination  Problem # 8:  GOUT, UNSPECIFIED (ICD-274.9) followed by Dr. Charlestine Night for gout vs RA.  On allopurinol and methotrexate and prednisone. Her updated medication list for this problem includes:    Allopurinol 300 Mg Tabs (Allopurinol) .[name] Kitchen... Take 1  tablet by mouth twice a day per dr trueslow  Complete Medication List: 1)  Advair Diskus 500-50 Mcg/dose Misc (Fluticasone-salmeterol) .[name] Kitchen.. 1 puff inh two times a day 2)  Albuterol 90 Mcg/act Aers (Albuterol) .... Inhale 1 puff using inhaler four times a day 3)  Allopurinol 300 Mg Tabs (Allopurinol) .... Take 1  tablet by mouth twice a day per dr trueslow 4)  Ascensia Elite Test Strp (Glucose blood) .... Test blood sugar twice daily 5)  Darvocet-n 100 100-650 Mg Tabs (Propoxyphene n-apap) .... Take 1 tablet by mouth every six hours- needs appointment before any more  refills 6)  Doxepin Hcl 100 Mg Caps (Doxepin hcl) .... Take 1 capsule by mouth at bedtime 7)  Epipen 2-pak 0.3 Mg/0.13ml (1:1000) Devi (Epinephrine hcl (anaphylaxis)) .... Inject 1 pen subcutaneously as directed 8)  Glipizide 10 Mg Tb24 (Glipizide) .... Take 1 tablet by mouth once a day 9)  Hydrochlorothiazide 25 Mg Tabs (Hydrochlorothiazide) .... Take 1 tablet by mouth twice a day 10)  Lasix 80 Mg Tabs (Furosemide) .... Take 1 tablet by mouth twice a day 11)  Lexapro 10 Mg Tabs (Escitalopram oxalate) .... Take 1 tablet by mouth once a day 12)  Metformin Hcl 500 Mg Tabs (Metformin hcl) .... Take 1 tablet by mouth twice a day 13)  Potassium Chloride Crys Cr 20 Meq Tbcr (Potassium chloride crys cr) .... Take 3 tablet by mouth twice times a day and 2 tablets once daily 14)  Ranitidine Hcl 300 Mg Caps (Ranitidine hcl) .... Take 1 capsule by mouth once a day 15)  Spironolactone 50 Mg Tabs (Spironolactone) .... Take 1 tablet by mouth once a day 16)  Zocor 10 Mg Tabs (Simvastatin) .... Take 1 tablet by mouth once a day 17)  Nexium 40 Mg Cpdr (Esomeprazole magnesium) .[name] Kitchen.. 1 tablet by mouth twice daily 18)  Calcium 500 500 Mg Tabs (Calcium carbonate) .[name] Kitchen.. 1 tablet by mouth daily 19)  Methotrexate 2.5 Mg Tabs (Methotrexate sodium) .... Take as directed by dr. Charlestine Night 20)  Oxybutynin Chloride 5 Mg Tabs (Oxybutynin chloride) .[name] Kitchen.. 1 tablet by mouth two times a day as directed 21)  Lyrica 100 Mg Caps (Pregabalin) .[name] Kitchen.. 1 tab by mouth three times a day 22)  Lantus 100 Unit/ml Soln (Insulin glargine) .... 42 units daily at bedtime 23)  Warfarin Sodium 5 Mg Tabs (Warfarin sodium) .... Take as directed 24)  Folic Acid 1 Mg Tabs (Folic acid) .[name] Kitchen.. 1 tablet by mouth daily while on methotrexate 25)  Clonidine Hcl 0.2 Mg Tabs (Clonidine hcl) .... 2 by mouth at bedtime for depression 26)  Amitriptyline Hcl 25 Mg Tabs (Amitriptyline hcl) .... Take one tablet at bedtime 27)  Prednisone 5 Mg Tabs (Prednisone) .[name] Kitchen.. 1 daily  by dr Jolee Ewing 28)  Ferrous Sulfate 325 (65 Fe) Mg Tbec (Ferrous sulfate) .... 3x per day 29)  Doxepin Hcl 25 Mg Caps (Doxepin hcl) .... Take 1 capsule by mouth once a day at bedtime 30)  Propoxyphene N-apap 100-650 Mg Tabs (Propoxyphene n-apap) .... At bedtime  Patient Instructions: 1)  Increase insulin to 42 units once a day. 2)  Follow up with Dr. Juleen China at end of August for diabetes re-check. Physical Exam General appearance: alert, well-developed, and well-nourished. Cushingoid appearance Extremities: compression stockings on, no pedal edema Neurological: alert & oriented X3.     Last PAP:  Done. (08/16/2006 12:00:00 AM) PAP Next Due:  3 yr Last Mammogram:  BI-RADS CATEGORY 3:  Probably benign finding(s) - short interval^MM DIGITAL DIAGNOSTIC UNILAT R (07/18/2008 11:40:00 AM) Mammogram Next Due:  1 yr

## 2010-09-15 NOTE — Miscellaneous (Signed)
Summary: allergies  Clinical Lists Changes rec'd call from nurse in ED wanting to check her list of allergies against what we had. there were several that she had that we do not.  message to pcp to look up on e chart & add to allergies.Elige Radon RN  September 16, 2009 10:41 AM   ALLERGIES:  CODEINE, PENICILLIN, SULFA, LITHIUM, VALIUM, COGENTIN,   HALDOL, LISINOPRIL, and THIORIDAZINE

## 2010-09-15 NOTE — Miscellaneous (Signed)
Summary: cough with lisinopril  Clinical Lists Changes states after 2 doses her cough is back. had been taken off this med for same before. wants to go back on diovan which worked. appt with S. Saxon as pt is here for labs & would prefer not to have to come back.Elige Radon RN  April 22, 2009 9:59 AM   See note, lisinopril discontinued

## 2010-09-15 NOTE — Assessment & Plan Note (Signed)
Summary: Diabetes/Pain - Rx Clinic   Vital Signs:  Patient profile:   60 year old female Height:      61 inches Weight:      213 pounds BMI:     40.39  Primary Care Vester Titsworth:  Briscoe Deutscher DO   History of Present Illness: Patient arrives with Helene Kelp who assists taking care of her.   She brings in her blood sugar log that shows Blood glucoses form 167 fsasting to > 400.  Majority of readings > 300 despite reported use of Lantus 44 units each night AND 5 units novolog prior to each meal.   Has only a few Darvocet left and needs an alternative plan for darvocet prior to bedtime.     Current Medications (verified): 1)  Advair Diskus 500-50 Mcg/dose Misc (Fluticasone-Salmeterol) .[name] Kitchen.. 1 Puff Inh Two Times A Day 2)  Albuterol 90 Mcg/act Aers (Albuterol) .... Inhale 1 Puff Using Inhaler Four Times A Day 3)  Allopurinol 300 Mg Tabs (Allopurinol) .... Take 1  Tablet By Mouth Twice A Day Per Dr Jolee Ewing 4)  Ascensia Elite Test  Strp (Glucose Blood) .... Test Blood Sugar Twice Daily 5)  Doxepin Hcl 100 Mg Caps (Doxepin Hcl) .... Take 1 Capsule By Mouth At Bedtime 6)  Epipen 2-Pak 0.3 Mg/0.58ml (1:1000) Devi (Epinephrine Hcl (Anaphylaxis)) .... Inject 1 Pen Subcutaneously As Directed 7)  Lasix 80 Mg Tabs (Furosemide) .... Take 1 Tablet By Mouth Twice A Day 8)  Lexapro 10 Mg Tabs (Escitalopram Oxalate) .... Take 1 Tablet By Mouth Once A Day 9)  Metformin Hcl 500 Mg Tabs (Metformin Hcl) .... Take 1 Tablet By Mouth Twice A Day 10)  Potassium Chloride Crys Cr 20 Meq Tbcr (Potassium Chloride Crys Cr) .... Take 3 Tabs By Mouth in The Am, 3 At Noon, and 3 At [facility] 11)  Ranitidine Hcl 300 Mg Caps (Ranitidine Hcl) .... Take 1 Capsule By Mouth Once A Day 12)  Spironolactone 50 Mg Tabs (Spironolactone) .... Take 1 Tablet Daily in The Morning 13)  Zocor 10 Mg Tabs (Simvastatin) .... Take 1 Tablet By Mouth Once A Day 14)  Nexium 40 Mg  Cpdr (Esomeprazole Magnesium) .[name] Kitchen.. 1 Tablet By Mouth Twice Daily 15)   Calcium 500 500 Mg  Tabs (Calcium Carbonate) .[name] Kitchen.. 1 Tablet By Mouth Daily 16)  Methotrexate 2.5 Mg  Tabs (Methotrexate Sodium) .... Take As Directed By Dr. Charlestine Night 5 Mg On Saturday and Sunday 17)  Oxybutynin Chloride 5 Mg  Tabs (Oxybutynin Chloride) .[name] Kitchen.. 1 Tablet By Mouth Two Times A Day As Directed 18)  Lyrica 100 Mg  Caps (Pregabalin) .[name] Kitchen.. 1 Tab By Mouth Three Times A Day 19)  Lantus 100 Unit/ml Soln (Insulin Glargine) .... 44 Units Daily At Bedtime 20)  Warfarin Sodium 5 Mg Tabs (Warfarin Sodium) .... Take As Directed 21)  Folic Acid 1 Mg Tabs (Folic Acid) .[name] Kitchen.. 1 Tablet By Mouth Daily While On Methotrexate 22)  Clonidine Hcl 0.2 Mg Tabs (Clonidine Hcl) .... 2 By Mouth At Bedtime For Depression 23)  Amitriptyline Hcl 25 Mg Tabs (Amitriptyline Hcl) .... Take One Tablet At Bedtime 24)  Doxepin Hcl 25 Mg Caps (Doxepin Hcl) .... Take 1 Capsule By Mouth Once A Day At Bedtime 25)  Iron 325 (65 Fe) Mg Tabs (Ferrous Sulfate) .... One By Mouth Three Times A Day 26)  Hydrochlorothiazide 25 Mg Tabs (Hydrochlorothiazide) .[name] Kitchen.. 1 Tab Two Times A Day 27)  Novolog 100 Unit/ml Soln (Insulin Aspart) .... 5 Units Subcutaneous Injection At Each  Meal Disp: 1 Vial 28)  Vitamin D 1000 Unit Tabs (Cholecalciferol) .... Take 1 Tabs Daily  Allergies (verified): 1)  ! Codeine 2)  ! Penicillin 3)  ! Sulfa 4)  ! * Lithium 5)  ! Valium 6)  ! * Cogentin 7)  ! Lisinopril (Lisinopril) 8)  ! 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  remains under:suboptimal control  control of blood glucose control based on A1Cand fasting CBGs of: > 200 consistently andpre-prandial  readings of: 300-400 .  Control is suboptimal due to: suboptimal insulin dosing.  Denies hypoglycemic events.  Able to verbalize appropriate hypoglycemia management plan.  Reports  adherence with: lantus 44 QPM and novolog 5 units prior to meals. .  Adjusted lantus to 50 units each PM rapid insulin novolog to:8 units prior to breakfast and lunch AND 15 units prior to evenign meal.   Written pt instructions provided:  F/U with Dr. Juleen China in early January.  Then return to Rx Clinic in late January or early February.   TTFFC: 50  mins.    Her updated medication list for this problem includes:    Metformin Hcl 500 Mg Tabs (Metformin hcl) .[name] Kitchen... Take 1 tablet by mouth twice a day    Lantus 100 Unit/ml Soln (Insulin glargine) .[name] KitchenMarland KitchenMarland KitchenMarland Kitchen 50 units daily at bedtime. dispense one month supply.    Novolog 100 Unit/ml Soln (Insulin aspart) .[name] Kitchen... 8 units subcutaneous injection  prior to breakfast and lunch.  take 15 units prior to eveing meal.  disp: 1 month supply.  Orders: Inital Assessment Each 61min - [place] [phone])  Problem # 2:  OSTEOARTHRITIS, MULTI SITES (ICD-715.98) Assessment: Unchanged  Pain at night makes it difficult to fall asleep.  Patient has failed previous trials of stopping Darvocet at bedtime.   With recent removal from the market she will run out of darvocet and need a new alternative for pain preventing sleep induction.   Suggested trial fo 1000mg  of acteminophen 2 x 500mg  prior to bedtime.  If this does not work... Follow up with Dr. Juleen China and consider use of alternative.   The following medications were removed from the medication list:    Darvocet-n 100 100-650 Mg Tabs (Propoxyphene n-apap) .[name] Kitchen... Take 1 tablet at bedtime as needed    Propoxyphene N-apap 100-650 Mg Tabs (Propoxyphene n-apap) .[name] Kitchen... 1 tablet at bedtime as needed Her updated medication list for this problem includes:    Acetaminophen 500 Mg Tabs (Acetaminophen) .[name] Kitchen... Take two each night at bedtime.  (this will replace darvocet)  Orders: Inital Assessment Each 70min - [place] [id number])  Complete Medication List: 1)  Advair Diskus 500-50 Mcg/dose Misc (Fluticasone-salmeterol) .[name] Kitchen.. 1 puff inh two times a  day 2)  Albuterol 90 Mcg/act Aers (Albuterol) .... Inhale 1 puff using inhaler four times a day 3)  Allopurinol 300 Mg Tabs (Allopurinol) .... Take 1  tablet by mouth twice a day per dr trueslow 4)  Ascensia Elite Test Strp (Glucose blood) .... Test blood sugar twice daily 5)  Doxepin Hcl 100 Mg Caps (Doxepin hcl) .... Take 1 capsule by mouth at bedtime 6)  Epipen 2-pak 0.3 Mg/0.64ml (1:1000) Devi (Epinephrine hcl (anaphylaxis)) .... Inject 1 pen subcutaneously as directed 7)  Lasix 80 Mg Tabs (Furosemide) .... Take 1 tablet by mouth twice a day 8)  Lexapro 10 Mg Tabs (  Escitalopram oxalate) .... Take 1 tablet by mouth once a day 9)  Metformin Hcl 500 Mg Tabs (Metformin hcl) .... Take 1 tablet by mouth twice a day 10)  Potassium Chloride Crys Cr 20 Meq Tbcr (Potassium chloride crys cr) .... Take 3 tabs by mouth in the am, 3 at noon, and 3 at hs 11)  Ranitidine Hcl 300 Mg Caps (Ranitidine hcl) .... Take 1 capsule by mouth once a day 12)  Spironolactone 50 Mg Tabs (Spironolactone) .... Take 1 tablet daily in the morning 13)  Zocor 10 Mg Tabs (Simvastatin) .... Take 1 tablet by mouth once a day 14)  Nexium 40 Mg Cpdr (Esomeprazole magnesium) .[name] Kitchen.. 1 tablet by mouth twice daily 15)  Calcium 500 500 Mg Tabs (Calcium carbonate) .[name] Kitchen.. 1 tablet by mouth daily 16)  Methotrexate 2.5 Mg Tabs (Methotrexate sodium) .... Take as directed by dr. Charlestine Night 5 mg on saturday and sunday 17)  Oxybutynin Chloride 5 Mg Tabs (Oxybutynin chloride) .[name] Kitchen.. 1 tablet by mouth two times a day as directed 18)  Lyrica 100 Mg Caps (Pregabalin) .[name] Kitchen.. 1 tab by mouth three times a day 19)  Lantus 100 Unit/ml Soln (Insulin glargine) .... 50 units daily at bedtime. dispense one month supply. 20)  Warfarin Sodium 5 Mg Tabs (Warfarin sodium) .... Take as directed 21)  Folic Acid 1 Mg Tabs (Folic acid) .[name] Kitchen.. 1 tablet by mouth daily while on methotrexate 22)  Clonidine Hcl 0.2 Mg Tabs (Clonidine hcl) .... 2 by mouth at bedtime for  depression 23)  Amitriptyline Hcl 25 Mg Tabs (Amitriptyline hcl) .... Take one tablet at bedtime 24)  Doxepin Hcl 25 Mg Caps (Doxepin hcl) .... Take 1 capsule by mouth once a day at bedtime 25)  Iron 325 (65 Fe) Mg Tabs (Ferrous sulfate) .... One by mouth three times a day 26)  Hydrochlorothiazide 25 Mg Tabs (Hydrochlorothiazide) .[name] Kitchen.. 1 tab two times a day 27)  Novolog 100 Unit/ml Soln (Insulin aspart) .... 8 units subcutaneous injection  prior to breakfast and lunch.  take 15 units prior to eveing meal.  disp: 1 month supply. 28)  Vitamin D 1000 Unit Tabs (Cholecalciferol) .... Take 1 tabs daily 29)  Bd Insulin Syringe Ultrafine 30g X 1/2" 1 Ml Misc (Insulin syringe-needle u-100) .... Dispense supply for four times per day dosing of insulin. 30)  Acetaminophen 500 Mg Tabs (Acetaminophen) .... Take two each night at bedtime.  (this will replace darvocet)  Patient Instructions: 1)  Increase Lantus to 50 units each morning.  2)  Increase Novolog to 8 units prior to breakfast, 8 units prior to lunch and 15 units prior to evening meal.  3)  Continue to check your blood sugar each morning and prior to meals.  4)  Take TWO acetaminophen 500mg  (total 1000mg ) prior to bedtime as an alternative to darvocet.  5)  Keep next appointment with Dr. Juleen China.  Prescriptions: ACETAMINOPHEN 500 MG TABS (ACETAMINOPHEN) Take two each night at bedtime.  (This will replace DARVOCET)  #1 x 0   Entered by:   Rinaldo Ratel D   Authorized by:   Briscoe Deutscher DO   Signed by:   Janeann Forehand Pharm D on 07/25/2009   Method used:   Historical   [id number] BD INSULIN SYRINGE ULTRAFINE 30G X 1/2" 1 ML MISC (INSULIN SYRINGE-NEEDLE U-100) dispense supply for four times per day dosing of insulin.  #1 x 11   Entered by:   Rinaldo Ratel D   Authorized  by:   Briscoe Deutscher DO   Signed by:   Janeann Forehand Pharm D on 07/25/2009   Method used:   Electronically to        [facility] [phone]*  (retail)       [place], [place]  [address]       Ph: [phone]       Fax: [phone]   RxID:   [id number] NOVOLOG 100 UNIT/ML SOLN (INSULIN ASPART) 8 units subcutaneous injection  prior to breakfast and lunch.  Take 15 units prior to eveing meal.  Disp: 1 month supply.  #1 x 5   Entered by:   Rinaldo Ratel D   Authorized by:   Briscoe Deutscher DO   Signed by:   Janeann Forehand Pharm D on 07/25/2009   Method used:   Electronically to        [facility] [phone]* (retail)       [place], [place]  [address]       Ph: [phone]       Fax: [phone]   RxID:   [id number] LANTUS 100 UNIT/ML SOLN (INSULIN GLARGINE) 50 units daily at bedtime. Dispense one month supply.  #1 x 5   Entered by:   Rinaldo Ratel D   Authorized by:   Briscoe Deutscher DO   Signed by:   Janeann Forehand Pharm D on 07/25/2009   Method used:   Electronically to        [facility] [phone]* (retail)       [place],   [address]       Ph: [phone]       Fax: [phone]   [id number] VITAMIN D 1000 UNIT TABS (CHOLECALCIFEROL) take 1 tabs daily  #1 x 0   Entered and Authorized by:   Rinaldo Ratel D   Signed by:   Janeann Forehand Pharm D on 07/25/2009   Method used:   Historical   [id number] SPIRONOLACTONE 50 MG TABS (SPIRONOLACTONE) Take 1 tablet daily in the morning  #1 x 0   Entered and Authorized by:   Rinaldo Ratel D   Signed by:   Janeann Forehand Pharm D on 07/25/2009   Method used:   Historical   [id number]   Prevention & Chronic Care Immunizations   Influenza vaccine: Fluvax 3+  (05/21/2009)   Influenza vaccine due: 06/19/2008    Tetanus booster: 08/16/2001: Done.   Tetanus booster due: 08/17/2011    Pneumococcal vaccine: Done.  (09/16/2004)   Pneumococcal vaccine due: None  Colorectal Screening   Hemoccult: Done.  (12/14/2000)   Hemoccult due: Not Indicated     Colonoscopy: Done.  (03/16/2006)   Colonoscopy due: 03/16/2016  Other Screening   Pap smear: Done.  (08/16/2006)   Pap smear action/deferral: Deferred  (05/21/2009)   Pap smear due: 08/16/2009    Mammogram: ASSESSMENT: Negative - BI-RADS 1^MM DIGITAL SCREENING  (03/25/2009)   Mammogram due: 07/18/2009   Smoking status: quit  (07/08/2009)  Diabetes Mellitus   HgbA1C: 10.0   (05/06/2009)   Hemoglobin A1C due: 09/30/2007    Eye exam: Not documented    Foot exam: yes  (07/04/2008)   High risk foot: Not documented   Foot care education: Not documented   Foot exam  due: 05/03/2008    Urine microalbumin/creatinine ratio: Not documented    Diabetes flowsheet reviewed?: Yes   Progress toward A1C goal: Unchanged  Lipids   Total Cholesterol: 169  (06/30/2009)   LDL: 49  (06/30/2009)   LDL Direct: 96  (02/28/2008)   HDL: 43  (06/30/2009)   Triglycerides: 384  (06/30/2009)    SGOT (AST): 19  (05/21/2009)   SGPT (ALT): 39  (05/21/2009)   Alkaline phosphatase: 99  (05/21/2009)   Total bilirubin: 0.5  (05/21/2009)    Lipid flowsheet reviewed?: Yes   Progress toward LDL goal: At goal  Hypertension   Last Blood Pressure: 122 / 82  (07/08/2009)   Serum creatinine: 1.04  (06/24/2009)   Serum potassium 3.2  (06/24/2009)    Hypertension flowsheet reviewed?: Yes   Progress toward BP goal: At goal  Self-Management Support :   Personal Goals (by the next clinic visit) :     Personal A1C goal: 8  (07/13/2009)     Personal blood pressure goal: 130/80  (05/21/2009)     Personal LDL goal: 100  (05/21/2009)    Diabetes self-management support: Copy of home glucose meter record, CBG self-monitoring log, Written self-care plan  (07/25/2009)   Diabetes care plan printed    Hypertension self-management support: Written self-care plan  (07/13/2009)    Hypertension self-management support not done because: Good outcomes  (07/25/2009)    Lipid self-management support: Written self-care  plan  (07/13/2009)     Lipid self-management support not done because: Good outcomes  (07/25/2009)

## 2010-09-15 NOTE — Progress Notes (Signed)
Summary: Medication  Phone Note Call from Patient Call back at Home Phone [phone]   Caller: neice Summary of Call: pt is trying to get refills on oxyvutynin and spironolatone, pt has already called pharmacy, pt goes to [facility] Initial call taken by: ERIN LEVAN,  January 20, 2007 10:53 AM  Follow-up for Phone Call        will forward to MD. Follow-up by: Marcell Barlow RN,  January 23, 2007 10:58 AM  Additional Follow-up for Phone Call Additional follow up Details #1::        Rx Called In Additional Follow-up by: Dixon Boos MD,  January 23, 2007 12:05 PM

## 2010-09-15 NOTE — Progress Notes (Signed)
Summary: Rx Req  Phone Note Refill Request Call back at [phone] Message from:  THERESA-NIECE  Refills Requested: Medication #1:  NOVOLOG 100 UNIT/ML SOLN 15 units subcutaneous injection  prior to breakfast and lunch and eveing meal.  Disp: 1 month supply. WILL NEED NEW RX DUE TO DOSAGE BEING UPPED.  [place]  Initial call taken by: Raymond Gurney,  October 31, 2009 3:01 PM  Follow-up for Phone Call        will forward to MD. Follow-up by: Marcell Barlow RN,  October 31, 2009 3:04 PM    Prescriptions: NOVOLOG 100 UNIT/ML SOLN (INSULIN ASPART) 15 units subcutaneous injection  prior to breakfast and lunch and eveing meal.  Disp: 1 month supply.  #1 x 5   Entered and Authorized by:   Briscoe Deutscher DO   Signed by:   Briscoe Deutscher DO on 11/01/2009   Method used:   Electronically to        [facility] [phone]* (retail)       [place], [place]  [address]       Ph: [phone]       Fax: [phone]   RxID:   [id number] LANTUS 100 UNIT/ML SOLN (INSULIN GLARGINE) 30 units in the am and 30 units in the pm. Dispense one month supply.  #1 x 3   Entered and Authorized by:   Briscoe Deutscher DO   Signed by:   Briscoe Deutscher DO on 11/01/2009   Method used:   Electronically to        [facility] [phone]* (retail)       [place], [place]  [address]       Ph: [phone]       Fax: [phone]   RxID:   [id number]

## 2010-09-15 NOTE — Progress Notes (Signed)
Summary: pls call  Phone Note Call from Patient Call back at Home Phone [phone]   Caller: g'daughter-[name] Summary of Call: returning call about her potassium Initial call taken by: Audie Clear,  April 16, 2009 10:15 AM  Follow-up for Phone Call        attempted to contact. phone busy. Follow-up by: Briscoe Deutscher MD,  April 17, 2009 9:15 AM  Additional Follow-up for Phone Call Additional follow up Details #1::        please call patient's granddaughter, teresa, and tell her that instead of adding more potassium, i am going to change ms. Laplant's HCTZ to lisinopril/HCTZ. let her know that lisinopril is a medicine that will help the patient to hold on to her potassium. i will decrease the HCTZ slightly so that her blood pressure should not be affected. this will continue to be one pill. i would like to recheck her K in one week. (will add as future order) Additional Follow-up by: Briscoe Deutscher MD,  April 17, 2009 9:18 AM    New/Updated Medications: LISINOPRIL-HYDROCHLOROTHIAZIDE 10-12.5 MG TABS (LISINOPRIL-HYDROCHLOROTHIAZIDE) take one by mouth daily Prescriptions: LISINOPRIL-HYDROCHLOROTHIAZIDE 10-12.5 MG TABS (LISINOPRIL-HYDROCHLOROTHIAZIDE) take one by mouth daily  #30 x 1   Entered and Authorized by:   Briscoe Deutscher MD   Signed by:   Briscoe Deutscher MD on 04/17/2009   Method used:   Electronically to        [facility] [phone]* (retail)       [place], [place]  [address]       Ph: [phone]       Fax: [phone]   RxID:   [id number]   Appended Document: pls call Helene Kelp notified.

## 2010-09-15 NOTE — Progress Notes (Signed)
Summary: Beh Med  Phone Note Call from Patient Call back at Home Phone [phone]   Caller: Chaney Born Summary of Call: Wants to speak with Dr. Ronnald Ramp about pt going to [place].  Pt doesn't want to go, but needs to. Initial call taken by: Drucie Ip,  June 05, 2007 11:00 AM  Follow-up for Phone Call        spoke with pt's neice.  doxepin dose reduced upon hospital discharge.  Since coming home from hospital on reduced doxepin dose, pt unable to sleep, wandering around at night, thrashing around in bed.  Family restarted doxepin 100mg  dose  and pt improved.  Wants pt to resume deoxein 100mg  at bedtime.  Has appt with psychiatrist 10/24.  Asked them to discuss with psychiatrist at f/u appt. Follow-up by: Dixon Boos MD,  June 06, 2007 1:40 PM

## 2010-09-15 NOTE — Miscellaneous (Signed)
Summary: visit note from memr- 06/14/06  CC: f/u HPI:  Since her last visit, [name] has been referred to Dr. Doylene Cross ([facility]) for tacycardia with RBBB on EKG at her last visit.  She says he did a stress test that was reportedly normal and is to f/u with him in several months.  She says Dr. Doylene Cross attributed her tachycardia to the chronic steroid use.  She has no cardiac history and no chest pain, but multiple risk factors for developing CAD.  She has also seen Dr. Charlestine Cross about her chronic joint pains.  He did multiple x-rays that showed severe degnerative arthritis and expected her to likely need bilateral knee replacements in the future.  He also noted that her symptoms are not classic for gout since there is no effusion, but he did not change any of her gout meds.  She also received a steroid injection in her right knee which she says helped tremendously with the pain.  She sees Dr. Charlestine Cross again in 2 weeks.  Today, [name] boasts that she feels much better.  Her LE swelling is improved and she says the pain in her legs is actually tolerable.  She continues to take 2 darvocets daily for pain.   She has been happy with the addition of lisinopril at her last visit.  She does not a dry cough at Cross time, but says that was present even before starting the ACE inhibitor.  Her fasting sugars have been running 110-140.  The highest number has been 152.  No new complaints today.  Vitals noted.  BP improved to 110/66, P 108  PE: Gen- pleasant, NAD CV- tachycardic, regular rhythm, no murmur Ext- 1+ edema to ankles bilaterally, improved from prior exam.  No erythema or effusion around bilateraly knees.  Knees nontender to palpation. Skin- venous stasis changes in LE's bilateraly.  A/P: 1.  polyarthralgia- being followed by Dr. Charlestine Cross, although he seems to suspect that there is another disease process besides gout alone.  She does have severe degenrative arthritis on x-rays.  She continues on her  meds as previously prescribed for now.  They include prednisone 10mg  daily, allopurinol, and colchicine.  She also takes 2 darvocet daily for joint pain.  She will follow with Dr. Charlestine Cross in 2 weeks.  2.  HTN- excellent control with addition of lisinopril at her last visit.  We will continue spironolactone, hctz, lasix, and clonidine 0.4mg  qhs for now.  However, if her SBP remains < 120, perhaps we can discontinue clonidine to help reduce polypharmacy in this patient.  3.  Hypokalemia- K was 3.1 at her last appointment and she increased her KCl for 3 days at that time.  Will check BMET today.  K should improve after adding ACE inhibitor.  Prednisone may also be attributing to her low K in addition to the many diuretics.  Hypokalemia may be contributing to her lower extremity pain.  4.  Tachycardia-  RBBB on ekg at her last visit.  Evaluated by Cards with stress test as mentioned above.  TSH at her last visit was WNL.  She is to f/u with cards in several months.  ?perhaps secondary to one of the many medications she takes?  5.  DM2- steroid-induced?  On glipizide10mg  daily.  A1C 6.6 04/2006.  Will recheck in December 2007.  On ACEi.  6.  Chronic lower extremity swelling- no documented CHF.  On multiple diuretics including hctz, lasix, spironolactone and insists that they not be stopped or changed in  any way.  Perhaps due to chronic steroid use?  7.  GERD- h/o Barrett's esophagus.  EGD in 03/2006 negative for dysplasia.  Repeat EGD in 2-3 years.  On ranitidine 300mg  qhs.  Will refill Nexium 40mg  BID as she says her reflux is intolerable without both meds on board.  8.  HM- she will schedule a mammo this month.  Gave her a lap slip to have lipids drawn since they haven't been drawn since 2003.  She received a lab slip at her last appointment and never came back for labs to be drawn.  Pap smear due, but she wishes to wait on that.  Colonoscopy due August 2012.

## 2010-09-15 NOTE — Progress Notes (Signed)
Summary: RE: UROLOGIST/TS  ---- Converted from flag ---- ---- 03/21/2010 10:03 PM, Briscoe Deutscher DO wrote: please see if urology appt completed. ------------------------------  called Clarene Critchley (pt's neice). pt did keep the appt. She will have the notes faxed to [place].

## 2010-09-15 NOTE — Miscellaneous (Signed)
Summary: visit note from memr- 08/03/07  CC: f/u  HPI: [name] recently was tapered from prednisone by Dr. Charlestine Night.  After 2 days off the medication, she developed extensive swelling, her pain was 10/10, and she could barely get out of bed.  Dr. Charlestine Night has restarted this medication at 15mg  daily and they are supposed to follow-up with him in 2 days.  She is very reluctant to come off the chronic steroid and is willing to accept the risks including bone loss and complications from hyperglycemia as well as a decreased immune system.  She has recently been extensively evaluated for bruising of unknow etiology.  She had extensive labwork done, which was all essentially normal.  Her bruising has completely resolved.  She was recently hospitalized for asthma/COPD exacerbation.  She continues with a daily cough, but non-productive.  Her breathing is back to her baseling, and she is using albuterol 4x/day prn as well as advair twice daily.  She has some recent stressors in her life with some sick loved ones.  She has also cut back her prempro to every other day and has had no problems since doing this.  [name] has no new complaints today and says she feels great since restarting her prednisone.  Vitals noted.  BP 130/72 A1C 7.1%  Gen- pleasant, NAD neck- supple, no mass, nontender CV- RRR, no murmur Lungs- normal effort, CTAB, no wheezes ext- no edema skin- no bruising or rash  A/P: 60 yo with multiple medical problems: 1.  Resp- no history of smoking, but on advair and albuterol for presumed COPD.  Pt. Agreeable for scheduling PFT's for a formal diagnosis.  Set up for Aug 18, 2005.  No symptoms of exacerbation currently.  2.  Menopausal syndrome- attempting to taper prempro.  Instructed to taper further to MWF only.  Given history of possible stroke and PE, she understands the risks of being on estrogen replacement.  She takes it for a history of intolerable hot flashes.  3.  DM- recent  diagnosis.  On glipizide only, with room to increase dose.  A1C creeping up.  Sugars run 114-147, but occasionally in 400's.  Hyperglycemia thought to be steroid-related.  She is due for an eye exam and will make an appointment.  4.  Depression- does not want to stop lexapro given recent stress in her life  5. Recent elevated LFT's- ALT 47 on recent labs.  Will f/u with LFT's in several months.  6.  Polypharmacy- [name] is VERY attached to ALL of her medicines and is very reluctant to stop any of them.  She has a questionable history of schizophrenia for which she is on several psychotropic meds and is adamant about not stopping/changing them.  She seems to be doing well right now, so I will not make any further medication changes other than attempting to wean prempro as discussed above.

## 2010-09-15 NOTE — Progress Notes (Signed)
Summary: Appt  Phone Note Call from Patient Call back at Home Phone [phone]   Summary of Call: Is needing to discuss meds with someone.  Is unable to come to 8:30am appts (too early) and that is all that is avail. this month and we do not have July schedule out yet.  Pt cannot get refills until has an appt, needs to discuss. Initial call taken by: Drucie Ip,  January 22, 2008 11:39 AM  Follow-up for Phone Call        Spoke wih pt's daughter advised her that Dr Ronnald Ramp has called in refills of her mom's  med's and does nor want to bring her in for an appt early in the morning. Per Dr Ronnald Ramp pt can make an appt in July. Daughter notified Follow-up by: Carolyne Littles,  January 22, 2008 12:04 PM

## 2010-09-15 NOTE — Assessment & Plan Note (Signed)
Summary: 3 month rov/sl   Visit Type:  33mos Primary Provider:  Briscoe Deutscher [phone]   History of Present Illness: 60 yo with multiple medical problems including rheumatoid arthritis, obesity-hypoventilation syndrome, and diastolic CHF who presents for followup of pericarditis episode.  Patient had a PE in 4/10 (? trigger).  She was admitted in 5/10 with pleuritic chest pain.  CTA chest did not show new PE or expansion of prior PE. She did have a left pleural effusion.  Echo showed a small loculated pericardial effusion.  There was some evidence for ventricular interdependence suggesting a degree of constrictive physiology.  It was thought that she had acute pericarditis/pleuritis, probably predisposed by her rheumatoid arthritis.  ESR at the time was 108.  Patient was treated with high dose ASA and colchicine.  I had wanted her on colchicine for 3 months post-episode to decrease risk of pericarditis recurrence but apparently it was stopped soon after discharge.   Her main limitation now seems to be her diffuse joint pains from rheumatoid arthritis.  She is not short of breath walking around her house.  She does get short of breath walking to her mailbox and back, which is about 50 yards.  She denies orthopnea but sleeps on a wedge because of GERD.  No chest pain.  She has had ulcers on her lower legs bilaterally, most recently on the left.  She is on high doses of diuretics with hypokalemia, but when I broached the subject of decreasing them, she claims to swell up horribly if they are even slightly lowered.   Labs (5/10): ESR 108, creatinine 1.12 Labs (1/11): K 3.3, creatinine 1.36, LDL 49, TG 384, HDL 43  Current Medications (verified): 1)  Advair Diskus 500-50 Mcg/dose Misc (Fluticasone-Salmeterol) .[name] Kitchen.. 1 Puff Inh Two Times A Day 2)  Albuterol 90 Mcg/act Aers (Albuterol) .... Inhale 1 Puff Using Inhaler Four Times A Day 3)  Allopurinol 300 Mg Tabs (Allopurinol) .... Take 1  Tablet By Mouth  Twice A Day Per Dr Jolee Ewing 4)  Ascensia Elite Test  Strp (Glucose Blood) .... Test Blood Sugarfour Times Daily 5)  Doxepin Hcl 100 Mg Caps (Doxepin Hcl) .... Take 1 Capsule By Mouth At Bedtime 6)  Epipen 2-Pak 0.3 Mg/0.28ml (1:1000) Devi (Epinephrine Hcl (Anaphylaxis)) .... Inject 1 Pen Subcutaneously As Directed 7)  Lasix 80 Mg Tabs (Furosemide) .... Take 1 Tablet By Mouth Twice A Day 8)  Lexapro 10 Mg Tabs (Escitalopram Oxalate) .... Take 1 Tablet By Mouth Once A Day 9)  Metformin Hcl 500 Mg Tabs (Metformin Hcl) .... Take 1 Tablet By Mouth Twice A Day 10)  Potassium Chloride Crys Cr 20 Meq Tbcr (Potassium Chloride Crys Cr) .... Take 4  Tabs By Mouth in The Am, 4 At Noon, and 4 At [facility] 11)  Ranitidine Hcl 300 Mg Caps (Ranitidine Hcl) .... Take 1 Capsule By Mouth Once A Day 12)  Spironolactone 50 Mg Tabs (Spironolactone) .... Take 1 Tablet Daily in The Morning 13)  Zocor 10 Mg Tabs (Simvastatin) .... Take 1 Tablet By Mouth Once A Day 14)  Nexium 40 Mg  Cpdr (Esomeprazole Magnesium) .[name] Kitchen.. 1 Tablet By Mouth Twice Daily 15)  Calcium 500 500 Mg  Tabs (Calcium Carbonate) .[name] Kitchen.. 1 Tablet By Mouth Daily 16)  Methotrexate 2.5 Mg  Tabs (Methotrexate Sodium) .... Take As Directed By Dr. Charlestine Night 5 Mg On Saturday and Sunday 17)  Oxybutynin Chloride 5 Mg  Tabs (Oxybutynin Chloride) .[name] Kitchen.. 1 Tablet By Mouth Two Times A Day As  Directed 18)  Lyrica 100 Mg  Caps (Pregabalin) .[name] Kitchen.. 1 Tab By Mouth Three Times A Day 19)  Lantus 100 Unit/ml Soln (Insulin Glargine) .... 50 Units Daily At Bedtime. Dispense One Month Supply. 20)  Warfarin Sodium 5 Mg Tabs (Warfarin Sodium) .... Take As Directed 21)  Folic Acid 1 Mg Tabs (Folic Acid) .[name] Kitchen.. 1 Tablet By Mouth Daily While On Methotrexate 22)  Clonidine Hcl 0.2 Mg Tabs (Clonidine Hcl) .... 2 By Mouth At Bedtime For Depression 23)  Amitriptyline Hcl 25 Mg Tabs (Amitriptyline Hcl) .... Take One Tablet At Bedtime 24)  Doxepin Hcl 25 Mg Caps (Doxepin Hcl) .... Take 1 Capsule By Mouth  Once A Day At Bedtime 25)  Iron 325 (65 Fe) Mg Tabs (Ferrous Sulfate) .... One By Mouth Three Times A Day 26)  Hydrochlorothiazide 25 Mg Tabs (Hydrochlorothiazide) .[name] Kitchen.. 1 Tab Two Times A Day 27)  Novolog 100 Unit/ml Soln (Insulin Aspart) .... 8 Units Subcutaneous Injection  Prior To Breakfast and Lunch.  Take 15 Units Prior To Eveing Meal.  Disp: 1 Month Supply. 28)  Vitamin D 1000 Unit Tabs (Cholecalciferol) .... Take 1 Tabs Daily 29)  Bd Insulin Syringe Ultrafine 30g X 1/2" 1 Ml Misc (Insulin Syringe-Needle U-100) .... Dispense Supply For Four Times Per Day Dosing of Insulin. 30)  Hydrocodone-Acetaminophen 5-325 Mg Tabs (Hydrocodone-Acetaminophen) .[name] Kitchen.. 1 By Mouth Up To 4 Times Per Day As Needed For Pain  Allergies: 1)  ! Codeine 2)  ! Penicillin 3)  ! Sulfa 4)  ! * Lithium 5)  ! Valium 6)  ! * Cogentin 7)  ! Lisinopril (Lisinopril) 8)  ! * Hyddal 9)  Thioridazine Hcl (Thioridazine Hcl)  Past History:  Past Medical History: 1. Amenorrhea since 60 years old 2. Barretts Esophagus - EGD 8/07. (Repeat 2-3 years) 3. Nephrolithiasis 2/03 4. Colonoscopy- benign polyps (12/2000), repeat colonoscopy performed in 2007 5. Endometrial biopsy 10/03 benign columnar mucosa (limited material) 6. Asthma: PFT's (10/14/06)- mild obstructive airway dz, poor effort 7. Obesity-hypoventilation syndrome: uses CPAP at night. 8. Obesity 9. Diastolic CHF: EF [id number] on echo (5/10) 10.  Pulmonary embolus (4/10): ? trigger.  11.  Acute pericarditis/pleuritis (5/10): Echo (5/10) showed EF 55%, possible posterior HK, > 25% respiratory variation of mitral inflow suggesting some interventricular interdependence, IVC not significantly dilated.  There was a small organized pericardial effusion and a moderate left pleural effusion. Some evidence suggesting constrictive physiology.  ESR was 108. Patient treated with high dose ASA and colchicine.  12. CKD: last creatinine 1.12 13. Chronic anemia 14. Diabetes mellitus type  II 15. Gout 16. Rheumatoid arthritis (followed by Dr. Charlestine Night). 17. Depression 18. GERD 19. HTN 20. Hyperlipidemia 21. Schizophrenia 22. Polypharmacy 23. Falls 24. Hypokalemia 25. Vitamin D 22 (10/6) 26. Fibromyalgia  Family History: Reviewed history from 04/25/2009 and no changes required. 6 brothers and 6 sisters., Father died of black lung, Mother with bone cancer, One brother with lung cancer and another with` blood` cancer., One sister died of colon cancer., Strong FH of DM, CAD, and kidney problems  Social History: Reviewed history from 10/13/2006 and no changes required. Divorced for greater than 30 years.  Born in [place] but lived in [place] for most of her life. No children. Stopped smoking in 1973, no etoh, no drug use.  Pt was adopted.  Not working secondary to schizophrenia.  Niece, Helene Kelp, helps care for her.  Not sexually active.  Review of Systems       All systems reviewed and negative except  as per HPI.   Vital Signs:  Patient profile:   60 year old female Height:      61 inches Weight:      207 pounds BMI:     39.25 Pulse rate:   95 / minute Resp:     16 per minute BP sitting:   115 / 72  (left arm)  Vitals Entered By: Lynden Ang (August 19, 2009 10:31 AM)  Physical Exam  General:  Well developed, well nourished, in no acute distress.  Obese.  Neck:  Neck supple, no JVD. No masses, thyromegaly or abnormal cervical nodes. Lungs:  Clear bilaterally to auscultation and percussion. Heart:  Non-displaced PMI, chest non-tender; regular rate and rhythm, S1, S2 without murmurs, rubs or gallops. Carotid upstroke normal, no bruit. Unable to feels pedal pulses due to edema.  1+ chronic ankle edema.  Abdomen:  Bowel sounds positive; abdomen soft and non-tender without masses, organomegaly, or hernias noted. No hepatosplenomegaly.  Obese. Extremities:  No clubbing or cyanosis. Neurologic:  Alert and oriented x 3. Psych:  Normal affect.   Impression &  Recommendations:  Problem # 1:  PERICARDITIS (ICD-423.9) Probable acute pericarditis during prior hospitalization with some findings suggestive of constrictive pericarditis by echo at that time.  Needs repeat echo to assess for any signs of constrictive pericarditis, especially given her large diuretic requirement.  We will schedule this.    Problem # 2:  DIASTOLIC HEART FAILURE, CHRONIC (ICD-428.32) No JVD but continued mild lower extremity edema.  She is on high fairly high diuretic doses with hypokalemia.  Increase KCl from 60 to 80 mEq three times a day.  ? constrictive pericarditis: repeat echo for constriction as above.  BMET/BNP in 7 days on higher K dose.    Problem # 3:  VENOUS STASIS ULCER (ICD-454.0) Unable to feel pedal pulses, perhaps due to chronic edema.  Will do lower extremity arterial dopplers to make sure there is not significant PAD as a cause of her ulcerations.   Other Orders: Echocardiogram (Echo) Arterial Duplex Lower Extremity (Arterial Duplex Low)  Patient Instructions: 1)  Your physician has recommended you make the following change in your medication:  2)  Increase Potassium to 80 meq three times a day---this will be four 76meq tablets threee times a day 3)  Your physician recommends that you return for lab work in: 7-10 days---BMP/BNP 428.32 --423.2 4)  Your physician has requested that you have an echocardiogram.  Echocardiography is a painless test that uses sound waves to create images of your heart. It provides your doctor with information about the size and shape of your heart and how well your heart's chambers and valves are working.  This procedure takes approximately one hour. There are no restrictions for this procedure. 5)  Your physician has requested that you have a lower or upper extremity arterial duplex.  This test is an ultrasound of the arteries in the legs or arms.  It looks at arterial blood flow in the legs and arms.  Allow one hour for Lower and  Upper Arterial scans. There are no restrictions or special instructions. 6)  Your physician recommends that you schedule a follow-up appointment in: 6 months with Dr. Loralie Champagne  Prescriptions: POTASSIUM CHLORIDE CRYS CR 20 MEQ TBCR (POTASSIUM CHLORIDE CRYS CR) take 4  tabs by mouth in the am, 3 at noon, and 3 at hs  #360 x 6   Entered by:   Desiree Lucy, RN, BSN   Authorized by:  Loralie Champagne, MD   Signed by:   Desiree Lucy, RN, BSN on 08/19/2009   Method used:   Electronically to        [facility] [phone]* (retail)       [place], [place]  [address]       Ph: [phone]       Fax: [phone]   RxID:   [id number]

## 2010-09-15 NOTE — Consult Note (Signed)
Summary: [facility]  [facility]   Imported By: Raymond Gurney 01/07/2009 11:50:28  _____________________________________________________________________  External Attachment:    Type:   Image     Comment:   External Document

## 2010-09-15 NOTE — Assessment & Plan Note (Signed)
Summary: check dog scratches. oozing & painful   Vital Signs:  Patient Profile:   60 Years Old Female Height:     61 inches Weight:      218.4 pounds BMI:     41.42 Temp:     97.7 degrees F oral Pulse rate:   111 / minute Pulse rhythm:   regular BP sitting:   118 / 77  (right arm)  Pt. in pain?   yes    Location:   Right lower extremity    Intensity:   5    Type:       burning  Vitals Entered By: Janeth Rase LPN (February 23, 624THL 2:52 PM)                  PCP:  Dixon Boos MD  Chief Complaint:  Right lower leg dog scratches inflammed.[name] Kitchen  History of Present Illness: Dog scratched lower leg January 27, seen in ER and treated with doxycycline.  Site has improved but has never completely healed.  Has been oozing.  LE edema chronic, recently restarted back on prednisone.        Physical Exam  General:     Chronically ill appearing, in good spirits Extremities:     3+ edema, with chronic venous stasis changes Skin:     6 cm old laceration with crusting surface, pulled by surrounding edema, some oozing of what appears to be intertsitial fluid    Impression & Recommendations:  Problem # 1:  WOUND, LEG (ICD-891.0) Only partial healing because of LE edema.  Unna boot placed, try to keep on for 4-5 days.  Doxy for secondary infection for one week only.  Recheck with primary MD in 2 weeks. Orders: [place]- Est Level  3 DL:[id number])   Problem # 2:  VENOUS INSUFFICIENCY, CHRONIC (ICD-459.81) LE compresion  Orders: [place]- Est Level  3 ([id number])   Complete Medication List: 1)  Advair Diskus 250-50 Mcg/dose Misc (Fluticasone-salmeterol) .... Inhale 1 puff as directed twice a day 2)  Albuterol 90 Mcg/act Aers (Albuterol) .... Inhale 1 puff using inhaler four times a day 3)  Allopurinol 300 Mg Tabs (Allopurinol) .... Take 1 tablet by mouth twice a day 4)  Amitriptyline Hcl 25 Mg Tabs (Amitriptyline hcl) .... Take 1 tablet by mouth every night 5)  Ascensia Elite Test Strp  (Glucose blood) .... Test blood sugar twice daily 6)  Clonidine Hcl 0.2 Mg Tabs (Clonidine hcl) .... Take 2 tablet by mouth every night 7)  Colchicine 0.6 Mg Tabs (Colchicine) .... Take 1 tablet by mouth twice a day 8)  Darvocet-n 100 100-650 Mg Tabs (Propoxyphene n-apap) .... Take 1 tablet by mouth every six hours- needs appointment before any more refills 9)  Diovan 40 Mg Tabs (Valsartan) .... Take 1 tablet by mouth once a day 10)  Doxepin Hcl 100 Mg Caps (Doxepin hcl) .... Take 1 capsule by mouth at bedtime 11)  Epipen 2-pak 0.3 Mg/0.75ml (1:1000) Devi (Epinephrine hcl (anaphylaxis)) .... Inject 1 pen subcutaneously as directed 12)  Glipizide 10 Mg Tb24 (Glipizide) .... Take 1 tablet by mouth once a day 13)  Hydrochlorothiazide 25 Mg Tabs (Hydrochlorothiazide) .... Take 1 tablet by mouth twice a day 14)  Lasix 80 Mg Tabs (Furosemide) .... Take 1 tablet by mouth twice a day 15)  Lexapro 10 Mg Tabs (Escitalopram oxalate) .... Take 1 tablet by mouth once a day 16)  Metformin Hcl 500 Mg Tabs (Metformin hcl) .... Take 1 tablet by mouth twice  a day 17)  Potassium Chloride Crys Cr 20 Meq Tbcr (Potassium chloride crys cr) .... Take 3 tablet by mouth twice times a day and 2 tablets once daily 18)  Prempro 0.3-1.5 Mg Tabs (Conj estrog-medroxyprogest ace) .... Take 1 tablet by mouth once a day 19)  Ranitidine Hcl 300 Mg Caps (Ranitidine hcl) .... Take 1 capsule by mouth once a day 20)  Spironolactone 50 Mg Tabs (Spironolactone) .... Take 1 tablet by mouth once a day 21)  Tgt Aspirin 81 Mg Tbec (Aspirin) .... Take 1 tablet by mouth once a day 22)  Zocor 10 Mg Tabs (Simvastatin) .... Take 1 tablet by mouth once a day 23)  Nexium 40 Mg Cpdr (Esomeprazole magnesium) .[name] Kitchen.. 1 tablet by mouth twice daily 24)  Calcium 500 500 Mg Tabs (Calcium carbonate) .[name] Kitchen.. 1 tablet by mouth tid 25)  Methotrexate 2.5 Mg Tabs (Methotrexate sodium) .... Take as directed by dr. Charlestine Night 26)  Doxepin Hcl 25 Mg Caps (Doxepin hcl) .[name] Kitchen..  1 tablet by mouth qhs 27)  Oxybutynin Chloride 5 Mg Tabs (Oxybutynin chloride) .[name] Kitchen.. 1 tablet by mouth two times a day as directed 28)  Prednisone 5 Mg Tabs (Prednisone) .[name] Kitchen.. 1 tablet by mouth daily 29)  Lyrica 100 Mg Caps (Pregabalin) .[name] Kitchen.. 1 tab by mouth three times a day 30)  Doxycycline Hyclate 100 Mg Tabs (Doxycycline hyclate) .... Two times a day for 7 days   Patient Instructions: 1)  Keep unna boot on for 4-5 days 2)  Complete course of antibiotic 3)  recheck on wound in a couple weeks with Dr. Ronnald Ramp   Prescriptions: DOXYCYCLINE HYCLATE 100 MG TABS (DOXYCYCLINE HYCLATE) two times a day for 7 days  #14 x 0   Entered and Authorized by:   Tereasa Coop NP   Signed by:   Tereasa Coop NP on 10/08/2008   Method used:   Electronically to        [facility] [phone]* (retail)       [place], [place]  [address]       Ph: [phone]       Fax: [phone]   RxID:   [id number]

## 2010-09-15 NOTE — Progress Notes (Signed)
Summary: wi request  Phone Note Call from Patient Call back at Home Phone [phone]   Reason for Call: Acute Illness Summary of Call: requesting wi appt, pt has had diarrhea for 4 days, advised to be here @ 1:30 Initial call taken by: ERIN LEVAN,  June 07, 2007 10:32 AM

## 2010-09-15 NOTE — Miscellaneous (Signed)
Summary: insulins   Clinical Lists Changes [facility] pharmacy called [phone]) asking for orders for insulin. lantus referenced in OV note. need sliding scale insulin sent there. message to md. I spoke with [facility].Elige Radon RN  October 25, 2008 11:22 AM  Medications: Added new medication of LANTUS 100 UNIT/ML SOLN (INSULIN GLARGINE) inject once daily as directed.  No more than 20 units once daily - Signed Rx of LANTUS 100 UNIT/ML SOLN (INSULIN GLARGINE) inject once daily as directed.  No more than 20 units once daily;  #1 x 2;  Signed;  Entered by: Dixon Boos MD;  Authorized by: Dixon Boos MD;  Method used: Electronically to [facility] [phone]*, [address] [place], [place]  [address], Ph: [phone], Fax: [phone]    Prescriptions: LANTUS 100 UNIT/ML SOLN (INSULIN GLARGINE) inject once daily as directed.  No more than 20 units once daily  #1 x 2   Entered and Authorized by:   Dixon Boos MD   Signed by:   Dixon Boos MD on 10/25/2008   Method used:   Electronically to        [facility] [phone]* (retail)       [place], [place]  [address]       Ph: [phone]       Fax: [phone]   RxID:   [id number]

## 2010-09-15 NOTE — Progress Notes (Signed)
       Additional Follow-up for Phone Call Additional follow up Details #2::    pa for lyrica placed in md chart box. need to know if she meets criteria before completing form Follow-up by: Elige Radon RN,  August 07, 2008 4:04 PM  Additional Follow-up for Phone Call Additional follow up Details #3:: Details for Additional Follow-up Action Taken: found previous pa & refaxed Additional Follow-up by: Elige Radon RN,  August 07, 2008 4:57 PM    Appended Document:  lyrica approved for 1 yr. faxed form to her pharmacy.[name] Kitchen

## 2010-09-15 NOTE — Assessment & Plan Note (Signed)
Summary: weeping wound on leg/[place]/[name]   Vital Signs:  Patient profile:   60 year old female Weight:      209.5 pounds Pulse rate:   102 / minute BP sitting:   122 / 86  (right arm)  Vitals Entered By: [name] CMA, (June 16, 2009 3:43 PM) CC: check lesion left leg. Is Patient Diabetic? Yes Pain Assessment Patient in pain? yes     Location: left leg Intensity: 4 Onset of pain  ? few days.   Primary Care Provider:  Briscoe Deutscher DO  CC:  check lesion left leg.[name] Kitchen  History of Present Illness: Patient with DM2 and diabetic neuropathy, here with her niece with complaint of development of open area in skin over L shin over the past several days.  Denies fevers or chills.  Has had production of clear/serous exudate, has not noticed purulence from the wound.  Has not had recent increase in weight or water retention.  A review of vital signs in the EMR shows fairly consistent weight over the past several months, wiht sharp drop since last visit.   Patient reports feet insensate due to diabetic neuropathy.   Allergies: 1)  ! Codeine 2)  ! Penicillin 3)  ! Sulfa 4)  ! * Lithium 5)  ! Valium 6)  ! * Cogentin 7)  ! Lisinopril (Lisinopril) 8)  ! * Hyddal  Physical Exam  General:  well appearing, no apparent distress Extremities:  left leg with 1cm2 round open area exuding clear serous fluid, no purulence, no fluctuance.  Surrounding hyperpigmented and rust colored skin in a band from ankle to just proximal to lower pole of patella.  No other areas of open skin in feet, between toes, along calf or elsewhere on leg.  No increased temperature in skin surrounding the ulcer.  Neurologic:  insensate in toes on left foot.   Impression & Recommendations:  Problem # 1:  VENOUS STASIS ULCER (ICD-454.0)  Patient with venous stasis ulcer on left shin; surrounding skin changes that appear consistent with chronic venous stasis, not suggestive of acute cellulitis.  Unna boot  placed today, discussed red flags for infection, reasons to call or come in before her scheduled Nov 4th appt with her primary physician, Dr. Juleen China.    Orders: [place]- Est Level  3 [id number])  Complete Medication List: 1)  Advair Diskus 500-50 Mcg/dose Misc (Fluticasone-salmeterol) .[name] Kitchen.. 1 puff inh two times a day 2)  Albuterol 90 Mcg/act Aers (Albuterol) .... Inhale 1 puff using inhaler four times a day 3)  Allopurinol 300 Mg Tabs (Allopurinol) .... Take 1  tablet by mouth twice a day per dr trueslow 4)  Ascensia Elite Test Strp (Glucose blood) .... Test blood sugar twice daily 5)  Darvocet-n 100 100-650 Mg Tabs (Propoxyphene n-apap) .... Take 1 tablet by mouth every six hours- needs appointment before any more refills 6)  Doxepin Hcl 100 Mg Caps (Doxepin hcl) .... Take 1 capsule by mouth at bedtime 7)  Epipen 2-pak 0.3 Mg/0.16ml (1:1000) Devi (Epinephrine hcl (anaphylaxis)) .... Inject 1 pen subcutaneously as directed 8)  Glipizide 10 Mg Tb24 (Glipizide) .... Take 1 tablet by mouth once a day 9)  Lasix 80 Mg Tabs (Furosemide) .... Take 1 tablet by mouth twice a day 10)  Lexapro 10 Mg Tabs (Escitalopram oxalate) .... Take 1 tablet by mouth once a day 11)  Metformin Hcl 500 Mg Tabs (Metformin hcl) .... Take 1 tablet by mouth twice a day 12)  Potassium Chloride  Crys Cr 20 Meq Tbcr (Potassium chloride crys cr) .... Take 3 tabs by mouth in the am, 3 at noon, and 3 at hs 13)  Ranitidine Hcl 300 Mg Caps (Ranitidine hcl) .... Take 1 capsule by mouth once a day 14)  Spironolactone 50 Mg Tabs (Spironolactone) .... Take 1 tablet by mouth once a day 15)  Zocor 10 Mg Tabs (Simvastatin) .... Take 1 tablet by mouth once a day 16)  Nexium 40 Mg Cpdr (Esomeprazole magnesium) .[name] Kitchen.. 1 tablet by mouth twice daily 17)  Calcium 500 500 Mg Tabs (Calcium carbonate) .[name] Kitchen.. 1 tablet by mouth daily 18)  Methotrexate 2.5 Mg Tabs (Methotrexate sodium) .... Take as directed by dr. Charlestine Night 19)  Oxybutynin Chloride 5 Mg Tabs  (Oxybutynin chloride) .[name] Kitchen.. 1 tablet by mouth two times a day as directed 20)  Lyrica 100 Mg Caps (Pregabalin) .[name] Kitchen.. 1 tab by mouth three times a day 21)  Lantus 100 Unit/ml Soln (Insulin glargine) .... 42 units daily at bedtime 22)  Warfarin Sodium 5 Mg Tabs (Warfarin sodium) .... Take as directed 23)  Folic Acid 1 Mg Tabs (Folic acid) .[name] Kitchen.. 1 tablet by mouth daily while on methotrexate 24)  Clonidine Hcl 0.2 Mg Tabs (Clonidine hcl) .... 2 by mouth at bedtime for depression 25)  Amitriptyline Hcl 25 Mg Tabs (Amitriptyline hcl) .... Take one tablet at bedtime 26)  Prednisone 5 Mg Tabs (Prednisone) .[name] Kitchen.. 1 daily by dr Jolee Ewing 27)  Ferrous Sulfate 325 (65 Fe) Mg Tbec (Ferrous sulfate) .... 3x per day 28)  Doxepin Hcl 25 Mg Caps (Doxepin hcl) .... Take 1 capsule by mouth once a day at bedtime 29)  Propoxyphene N-apap 100-650 Mg Tabs (Propoxyphene n-apap) .... At bedtime 30)  Iron 325 (65 Fe) Mg Tabs (Ferrous sulfate) .... One by mouth three times a day 31)  Hydrochlorothiazide 25 Mg Tabs (Hydrochlorothiazide) .... 1/2 tab two times a day  Patient Instructions: 1)  It was a pleasure to see you today.  The weeping ulcer on your left shin is the result of poor circulation.  It does not look infected to me today.  2)  An Unna boot was placed today.  Please leave it in place until your visit with Dr. Juleen China on Thursday, Nov 4th.  She will remove it then and re-evaluate it.  3)  Please notify us if you have fevers or chills, or if there is a foul odor from the bandage.

## 2010-09-15 NOTE — Assessment & Plan Note (Signed)
Summary: F/U BLOOD SUGARS/[facility]   Vital Signs:  Patient profile:   60 year old female Height:      60 inches Weight:      215.4 pounds Temp:     98.2 degrees F Pulse rate:   105 / minute BP sitting:   122 / 82  (left arm)  Vitals Entered By: Marcell Barlow RN (July 08, 2009 3:19 PM) CC: follow up on sore on left leg, DM Is Patient Diabetic? Yes Pain Assessment Patient in pain? yes     Location: feet Intensity: 9 Type: burning and numbness   Primary Care Provider:  Briscoe Deutscher DO  CC:  follow up on sore on left leg and DM.  History of Present Illness: [name] is 60 year old female with multiple medical problems, presenting for LE ulcer, DM.  1. LE Ulcer: continuing to improve per the patient and her niece. they removed the unna boot the other day. she does have some pain in the leg. she denies fever/chills. she uses a wedge to elevate her leg at night. no fever/chills, worsening redness or warmth to area.  3. DM: regimen: Lantus 45 units at hs, Novolog 5 units with each meal, Metformin 500 mg two times a day. patient has been instructed to check CBGs four times a day. she did bring her book today, but has few recorded numbers. she regularly checks her fasting BG, but does not regulary check her mealtime BG (and admits that she doesn't always take her medication). her fasting BG values show no trend at all (range 113 - 300s). she endorses polyuria, polydipsia, fatigue, numbness/tingling in feet. Note: patient had injection of DepoMedrol by rheumatologist on 10/14. she was on prednisone for gout x 4 days.    Habits & Providers  Alcohol-Tobacco-Diet     Tobacco Status: quit  Current Medications (verified): 1)  Advair Diskus 500-50 Mcg/dose Misc (Fluticasone-Salmeterol) .[name] Kitchen.. 1 Puff Inh Two Times A Day 2)  Albuterol 90 Mcg/act Aers (Albuterol) .... Inhale 1 Puff Using Inhaler Four Times A Day 3)  Allopurinol 300 Mg Tabs (Allopurinol) .... Take 1  Tablet By Mouth  Twice A Day Per Dr Jolee Ewing 4)  Ascensia Elite Test  Strp (Glucose Blood) .... Test Blood Sugar Twice Daily 5)  Darvocet-N 100 100-650 Mg Tabs (Propoxyphene N-Apap) .... Take 1 Tablet At Bedtime As Needed 6)  Doxepin Hcl 100 Mg Caps (Doxepin Hcl) .... Take 1 Capsule By Mouth At Bedtime 7)  Epipen 2-Pak 0.3 Mg/0.80ml (1:1000) Devi (Epinephrine Hcl (Anaphylaxis)) .... Inject 1 Pen Subcutaneously As Directed 8)  Lasix 80 Mg Tabs (Furosemide) .... Take 1 Tablet By Mouth Twice A Day 9)  Lexapro 10 Mg Tabs (Escitalopram Oxalate) .... Take 1 Tablet By Mouth Once A Day 10)  Metformin Hcl 500 Mg Tabs (Metformin Hcl) .... Take 1 Tablet By Mouth Twice A Day 11)  Potassium Chloride Crys Cr 20 Meq Tbcr (Potassium Chloride Crys Cr) .... Take 3 Tabs By Mouth in The Am, 3 At Noon, and 3 At [facility] 12)  Ranitidine Hcl 300 Mg Caps (Ranitidine Hcl) .... Take 1 Capsule By Mouth Once A Day 13)  Spironolactone 50 Mg Tabs (Spironolactone) .... Take 1 Tablet By Mouth Once A Day 14)  Zocor 10 Mg Tabs (Simvastatin) .... Take 1 Tablet By Mouth Once A Day 15)  Nexium 40 Mg  Cpdr (Esomeprazole Magnesium) .[name] Kitchen.. 1 Tablet By Mouth Twice Daily 16)  Calcium 500 500 Mg  Tabs (Calcium Carbonate) .[name] Kitchen.. 1 Tablet By  Mouth Daily 17)  Methotrexate 2.5 Mg  Tabs (Methotrexate Sodium) .... Take As Directed By Dr. Charlestine Night 5 Mg On Saturday and Sunday 18)  Oxybutynin Chloride 5 Mg  Tabs (Oxybutynin Chloride) .[name] Kitchen.. 1 Tablet By Mouth Two Times A Day As Directed 19)  Lyrica 100 Mg  Caps (Pregabalin) .[name] Kitchen.. 1 Tab By Mouth Three Times A Day 20)  Lantus 100 Unit/ml Soln (Insulin Glargine) .... 45 Units Daily At Bedtime 21)  Warfarin Sodium 5 Mg Tabs (Warfarin Sodium) .... Take As Directed 22)  Folic Acid 1 Mg Tabs (Folic Acid) .[name] Kitchen.. 1 Tablet By Mouth Daily While On Methotrexate 23)  Clonidine Hcl 0.2 Mg Tabs (Clonidine Hcl) .... 2 By Mouth At Bedtime For Depression 24)  Amitriptyline Hcl 25 Mg Tabs (Amitriptyline Hcl) .... Take One Tablet At Bedtime 25)   Ferrous Sulfate 325 (65 Fe) Mg  Tbec (Ferrous Sulfate) .... 3x Per Day 26)  Doxepin Hcl 25 Mg Caps (Doxepin Hcl) .... Take 1 Capsule By Mouth Once A Day At Bedtime 27)  Propoxyphene N-Apap 100-650 Mg Tabs (Propoxyphene N-Apap) .[name] Kitchen.. 1 Tablet At Bedtime As Needed 28)  Iron 325 (65 Fe) Mg Tabs (Ferrous Sulfate) .... One By Mouth Three Times A Day 29)  Hydrochlorothiazide 25 Mg Tabs (Hydrochlorothiazide) .[name] Kitchen.. 1 Tab Two Times A Day 30)  Novolog 100 Unit/ml Soln (Insulin Aspart) .... 5 Units Subcutaneous Injection At Each Meal Disp: 1 Vial 31)  Vitamin D 1000 Unit Tabs (Cholecalciferol) .... Take 2 Tabs Daily  Allergies (verified): 1)  ! Codeine 2)  ! Penicillin 3)  ! Sulfa 4)  ! * Lithium 5)  ! Valium 6)  ! * Cogentin 7)  ! Lisinopril (Lisinopril) 8)  ! * Hyddal  Past History:  Past medical, surgical, family and social histories (including risk factors) reviewed for relevance to current acute and chronic problems.  Past Medical History: Reviewed history from 06/24/2009 and no changes required. 1. Amenorrhea since 60 years old 2. Barretts Esophagus - EGD 8/07. (Repeat 2-3 years) 3. Nephrolithiasis 2/03 4. Colonoscopy- benign polyps (12/2000), repeat colonoscopy performed in 2007 5. Endometrial biopsy 10/03 benign columnar mucosa (limited material) 6. Asthma: PFT's (10/14/06)- mild obstructive airway dz, poor effort 7. Obesity-hypoventilation syndrome: uses CPAP at night. 8. Obesity 9. Diastolic CHF: EF [id number] on echo (5/10) 10.  Pulmonary embolus (4/10): ? trigger.  11.  Acute pericarditis/pleuritis (5/10): Echo (5/10) showed EF 55%, possible posterior HK, > 25% respiratory variation of mitral inflow suggesting some interventricular interdependence, IVC not significantly dilated.  There was a small organized pericardial effusion and a moderate left pleural effusion. Some evidence suggesting constrictive physiology.  ESR was 108. Patient treated with high dose ASA and colchicine.  12. CKD:  last creatinine 1.12 13. Chronic anemia 14. Diabetes mellitus type II 15. Gout 16. Rheumatoid arthritis (followed by Dr. Charlestine Night). 17. Depression 18. GERD 19. HTN 20. Hyperlipidemia 21. Schizophrenia 22. Polypharmacy 23. 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 Chronic Coumadin. 25. Hypokalemia 26. Vitamin D 22 (10/6)  Past Surgical History: Reviewed history from 05/21/2009 and no changes required. Appendectomy - 08/28/2001 Bladder stretching - 08/28/2001 Cholecystectomy - 08/28/2001 B/L knee replacement- Sept & Nov 2008 (Dr. Alvan Dame)  Family History: Reviewed history from 04/25/2009 and no changes required. 6 brothers and 6 sisters., Father died of black lung, Mother with bone cancer, One brother with lung cancer and another with` blood` cancer., One sister died of colon cancer., Strong FH of DM, CAD, and kidney problems  Social History: Reviewed history from 10/13/2006  and no changes required. Divorced for greater than 30 years.  Born in [place] but lived in [place] for most of her life. No children. Stopped smoking in 1973, no etoh, no drug use.  Pt was adopted.  Not working secondary to schizophrenia.  Niece, Helene Kelp, helps care for her.  Not sexually active.Smoking Status:  quit  Review of Systems       The patient complains of peripheral edema.  The patient denies fever, chest pain, dyspnea on exertion, abdominal pain, and severe indigestion/heartburn.    Physical Exam  General:  Well-developed, well-nourished, in no acute distress; alert, appropriate and cooperative throughout examination. Vitals reviewed. Lungs:  CTAB w/o wheeze or crackles  Heart:  tachycardic, distant but regular with no murmurs or rubs  Skin:  Left anterior leg with 1-2 cm round open area exuding clear serous fluid, no purulence, no fluctuance, no warmth.  Surrounding hyperpigmented and rust colored skin in a band from ankle to just proximal to lower pole of patella.    Impression & Recommendations:  Problem # 1:  VENOUS  STASIS ULCER (ICD-454.0) Assessment Improved Continues to improve. No red flags today. Reviewed strategies to decrease lower extremity edema.  Problem # 2:  IDDM (ICD-250.01) Assessment: Unchanged Unfortunately, the patient has not been compliant with taking medications or recording blood sugars. I have asked her to keep a better record of her blood sugar and to concentrated on taking her medications. She is to make an appointment with the pharmacy clinic to review this record in 3-4 weeks so that they can adjust her regimen. Her updated medication list for this problem includes:    Metformin Hcl 500 Mg Tabs (Metformin hcl) .[name] Kitchen... Take 1 tablet by mouth twice a day    Lantus 100 Unit/ml Soln (Insulin glargine) .[name] KitchenMarland KitchenMarland KitchenMarland Kitchen 45 units daily at bedtime    Novolog 100 Unit/ml Soln (Insulin aspart) .[name] KitchenMarland KitchenMarland KitchenMarland Kitchen 5 units subcutaneous injection at each meal disp: 1 vial  Complete Medication List: 1)  Advair Diskus 500-50 Mcg/dose Misc (Fluticasone-salmeterol) .[name] Kitchen.. 1 puff inh two times a day 2)  Albuterol 90 Mcg/act Aers (Albuterol) .... Inhale 1 puff using inhaler four times a day 3)  Allopurinol 300 Mg Tabs (Allopurinol) .... Take 1  tablet by mouth twice a day per dr trueslow 4)  Ascensia Elite Test Strp (Glucose blood) .... Test blood sugar twice daily 5)  Darvocet-n 100 100-650 Mg Tabs (Propoxyphene n-apap) .... Take 1 tablet at bedtime as needed 6)  Doxepin Hcl 100 Mg Caps (Doxepin hcl) .... Take 1 capsule by mouth at bedtime 7)  Epipen 2-pak 0.3 Mg/0.71ml (1:1000) Devi (Epinephrine hcl (anaphylaxis)) .... Inject 1 pen subcutaneously as directed 8)  Lasix 80 Mg Tabs (Furosemide) .... Take 1 tablet by mouth twice a day 9)  Lexapro 10 Mg Tabs (Escitalopram oxalate) .... Take 1 tablet by mouth once a day 10)  Metformin Hcl 500 Mg Tabs (Metformin hcl) .... Take 1 tablet by mouth twice a day 11)  Potassium Chloride Crys Cr 20 Meq Tbcr (Potassium chloride crys cr) .... Take 3 tabs by mouth in the am, 3 at noon, and 3  at hs 12)  Ranitidine Hcl 300 Mg Caps (Ranitidine hcl) .... Take 1 capsule by mouth once a day 13)  Spironolactone 50 Mg Tabs (Spironolactone) .... Take 1 tablet by mouth once a day 14)  Zocor 10 Mg Tabs (Simvastatin) .... Take 1 tablet by mouth once a day 15)  Nexium 40 Mg Cpdr (Esomeprazole magnesium) .[name] Kitchen.. 1 tablet by mouth  twice daily 16)  Calcium 500 500 Mg Tabs (Calcium carbonate) .[name] Kitchen.. 1 tablet by mouth daily 17)  Methotrexate 2.5 Mg Tabs (Methotrexate sodium) .... Take as directed by dr. Charlestine Night 5 mg on saturday and sunday 18)  Oxybutynin Chloride 5 Mg Tabs (Oxybutynin chloride) .[name] Kitchen.. 1 tablet by mouth two times a day as directed 19)  Lyrica 100 Mg Caps (Pregabalin) .[name] Kitchen.. 1 tab by mouth three times a day 20)  Lantus 100 Unit/ml Soln (Insulin glargine) .... 45 units daily at bedtime 21)  Warfarin Sodium 5 Mg Tabs (Warfarin sodium) .... Take as directed 22)  Folic Acid 1 Mg Tabs (Folic acid) .[name] Kitchen.. 1 tablet by mouth daily while on methotrexate 23)  Clonidine Hcl 0.2 Mg Tabs (Clonidine hcl) .... 2 by mouth at bedtime for depression 24)  Amitriptyline Hcl 25 Mg Tabs (Amitriptyline hcl) .... Take one tablet at bedtime 25)  Ferrous Sulfate 325 (65 Fe) Mg Tbec (Ferrous sulfate) .... 3x per day 26)  Doxepin Hcl 25 Mg Caps (Doxepin hcl) .... Take 1 capsule by mouth once a day at bedtime 27)  Propoxyphene N-apap 100-650 Mg Tabs (Propoxyphene n-apap) .[name] Kitchen.. 1 tablet at bedtime as needed 28)  Iron 325 (65 Fe) Mg Tabs (Ferrous sulfate) .... One by mouth three times a day 29)  Hydrochlorothiazide 25 Mg Tabs (Hydrochlorothiazide) .[name] Kitchen.. 1 tab two times a day 30)  Novolog 100 Unit/ml Soln (Insulin aspart) .... 5 units subcutaneous injection at each meal disp: 1 vial 31)  Vitamin D 1000 Unit Tabs (Cholecalciferol) .... Take 2 tabs daily  Patient Instructions: 1)  It was nice to see you today! 2)  Record your blood sugars four times a day and remember to take your medicine. 3)  Please make an appointment at the  pharmacy clinic for 3-4 weeks from now. 4)  Follow up in 4-6 weeks.  Appended Document: F/U BLOOD SUGARS/[facility]     Allergies: 1)  ! Codeine 2)  ! Penicillin 3)  ! Sulfa 4)  ! * Lithium 5)  ! Valium 6)  ! * Cogentin 7)  ! Lisinopril (Lisinopril) 8)  ! * Hyddal   Complete Medication List: 1)  Advair Diskus 500-50 Mcg/dose Misc (Fluticasone-salmeterol) .[name] Kitchen.. 1 puff inh two times a day 2)  Albuterol 90 Mcg/act Aers (Albuterol) .... Inhale 1 puff using inhaler four times a day 3)  Allopurinol 300 Mg Tabs (Allopurinol) .... Take 1  tablet by mouth twice a day per dr trueslow 4)  Ascensia Elite Test Strp (Glucose blood) .... Test blood sugar twice daily 5)  Darvocet-n 100 100-650 Mg Tabs (Propoxyphene n-apap) .... Take 1 tablet at bedtime as needed 6)  Doxepin Hcl 100 Mg Caps (Doxepin hcl) .... Take 1 capsule by mouth at bedtime 7)  Epipen 2-pak 0.3 Mg/0.71ml (1:1000) Devi (Epinephrine hcl (anaphylaxis)) .... Inject 1 pen subcutaneously as directed 8)  Lasix 80 Mg Tabs (Furosemide) .... Take 1 tablet by mouth twice a day 9)  Lexapro 10 Mg Tabs (Escitalopram oxalate) .... Take 1 tablet by mouth once a day 10)  Metformin Hcl 500 Mg Tabs (Metformin hcl) .... Take 1 tablet by mouth twice a day 11)  Potassium Chloride Crys Cr 20 Meq Tbcr (Potassium chloride crys cr) .... Take 3 tabs by mouth in the am, 3 at noon, and 3 at hs 12)  Ranitidine Hcl 300 Mg Caps (Ranitidine hcl) .... Take 1 capsule by mouth once a day 13)  Spironolactone 50 Mg Tabs (Spironolactone) .... Take 1 tablet by mouth once  a day 14)  Zocor 10 Mg Tabs (Simvastatin) .... Take 1 tablet by mouth once a day 15)  Nexium 40 Mg Cpdr (Esomeprazole magnesium) .[name] Kitchen.. 1 tablet by mouth twice daily 16)  Calcium 500 500 Mg Tabs (Calcium carbonate) .[name] Kitchen.. 1 tablet by mouth daily 17)  Methotrexate 2.5 Mg Tabs (Methotrexate sodium) .... Take as directed by dr. Charlestine Night 5 mg on saturday and sunday 18)  Oxybutynin Chloride 5 Mg Tabs (Oxybutynin  chloride) .[name] Kitchen.. 1 tablet by mouth two times a day as directed 19)  Lyrica 100 Mg Caps (Pregabalin) .[name] Kitchen.. 1 tab by mouth three times a day 20)  Lantus 100 Unit/ml Soln (Insulin glargine) .... 45 units daily at bedtime 21)  Warfarin Sodium 5 Mg Tabs (Warfarin sodium) .... Take as directed 22)  Folic Acid 1 Mg Tabs (Folic acid) .[name] Kitchen.. 1 tablet by mouth daily while on methotrexate 23)  Clonidine Hcl 0.2 Mg Tabs (Clonidine hcl) .... 2 by mouth at bedtime for depression 24)  Amitriptyline Hcl 25 Mg Tabs (Amitriptyline hcl) .... Take one tablet at bedtime 25)  Ferrous Sulfate 325 (65 Fe) Mg Tbec (Ferrous sulfate) .... 3x per day 26)  Doxepin Hcl 25 Mg Caps (Doxepin hcl) .... Take 1 capsule by mouth once a day at bedtime 27)  Propoxyphene N-apap 100-650 Mg Tabs (Propoxyphene n-apap) .[name] Kitchen.. 1 tablet at bedtime as needed 28)  Iron 325 (65 Fe) Mg Tabs (Ferrous sulfate) .... One by mouth three times a day 29)  Hydrochlorothiazide 25 Mg Tabs (Hydrochlorothiazide) .[name] Kitchen.. 1 tab two times a day 30)  Novolog 100 Unit/ml Soln (Insulin aspart) .... 5 units subcutaneous injection at each meal disp: 1 vial 31)  Vitamin D 1000 Unit Tabs (Cholecalciferol) .... Take 2 tabs daily   Prevention & Chronic Care Immunizations   Influenza vaccine: Fluvax 3+  (05/21/2009)   Influenza vaccine due: 06/19/2008    Tetanus booster: 08/16/2001: Done.   Tetanus booster due: 08/17/2011    Pneumococcal vaccine: Done.  (09/16/2004)   Pneumococcal vaccine due: None  Colorectal Screening   Hemoccult: Done.  (12/14/2000)   Hemoccult due: Not Indicated    Colonoscopy: Done.  (03/16/2006)   Colonoscopy due: 03/16/2016  Other Screening   Pap smear: Done.  (08/16/2006)   Pap smear action/deferral: Deferred  (05/21/2009)   Pap smear due: 08/16/2009    Mammogram: ASSESSMENT: Negative - BI-RADS 1^MM DIGITAL SCREENING  (03/25/2009)   Mammogram due: 07/18/2009   Smoking status: quit  (07/08/2009)  Diabetes Mellitus   HgbA1C: 10.0    (05/06/2009)   Hemoglobin A1C due: 09/30/2007    Eye exam: Not documented    Foot exam: yes  (07/04/2008)   High risk foot: Not documented   Foot care education: Not documented   Foot exam due: 05/03/2008    Urine microalbumin/creatinine ratio: Not documented    Diabetes flowsheet reviewed?: Yes   Progress toward A1C goal: Unchanged  Lipids   Total Cholesterol: 169  (06/30/2009)   LDL: 49  (06/30/2009)   LDL Direct: 96  (02/28/2008)   HDL: 43  (06/30/2009)   Triglycerides: 384  (06/30/2009)    SGOT (AST): 19  (05/21/2009)   SGPT (ALT): 39  (05/21/2009)   Alkaline phosphatase: 99  (05/21/2009)   Total bilirubin: 0.5  (05/21/2009)    Lipid flowsheet reviewed?: Yes   Progress toward LDL goal: Unchanged  Hypertension   Last Blood Pressure: 122 / 82  (07/08/2009)   Serum creatinine: 1.04  (06/24/2009)   Serum potassium 3.2  (06/24/2009)  Hypertension flowsheet reviewed?: Yes   Progress toward BP goal: At goal  Self-Management Support :   Personal Goals (by the next clinic visit) :     Personal A1C goal: 8  (07/13/2009)     Personal blood pressure goal: 130/80  (05/21/2009)     Personal LDL goal: 100  (05/21/2009)    Patient will work on the following items until the next clinic visit to reach self-care goals:     Medications and monitoring: take my medicines every day, bring all of my medications to every visit  (07/13/2009)     Eating: drink diet soda or water instead of juice or soda, eat more vegetables, use fresh or frozen vegetables, eat foods that are low in salt, eat baked foods instead of fried foods, eat fruit for snacks and desserts  (07/13/2009)    Diabetes self-management support: Written self-care plan  (07/13/2009)   Diabetes care plan printed    Hypertension self-management support: Written self-care plan  (07/13/2009)   Hypertension self-care plan printed.    Lipid self-management support: Written self-care plan  (07/13/2009)   Lipid self-care plan  printed.  Appended Document: Orders Update    Clinical Lists Changes  Orders: Added new Test order of [facility]- Est Level  3 [id number]) - Signed

## 2010-09-15 NOTE — Progress Notes (Signed)
Summary: UPDATE PROBLEM LIST   

## 2010-09-15 NOTE — Assessment & Plan Note (Signed)
Summary: FU/KH   Vital Signs:  Patient profile:   60 year old female Weight:      215 pounds Pulse rate:   80 / minute BP sitting:   132 / 78  Vitals Entered By: [name] CMA, (September 11, 2009 2:06 PM) CC: f/u chronic issues Is Patient Diabetic? Yes Pain Assessment Patient in pain? no        Primary Care Provider:  Briscoe Deutscher [id number]  CC:  f/u chronic issues.  History of Present Illness: 60 year old female:  1. Coumadin Therapy: INR 1.6 today. Has been stable for several weeks. Denies missing a dose or change in diet. 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  2. DM: Mealtime insulin adjusted at last visit (up to 10 units at each meal). Continued with Lantus 50 q hs. Log: checks BG QID. Lowest am 91. Mealtime BG still 200-500. Did not record meals for review.  3. Pain: Much improved now that she is back on Lyrica regularly. Hydrocodone works well for pain, using only at hs. No falls.  4. LE Edema: Improved. No ulcers. Wearing compression hose throughout the day.  Habits & Providers  Alcohol-Tobacco-Diet     Tobacco Status: quit > 6 months  Current Medications (verified): 1)  Advair Diskus 500-50 Mcg/dose Misc (Fluticasone-Salmeterol) .[name] Kitchen.. 1 Puff Inh Two Times A Day 2)  Albuterol 90 Mcg/act Aers (Albuterol) .... Inhale 1 Puff Using Inhaler Four Times A Day 3)  Allopurinol 300 Mg Tabs (Allopurinol) .... Take 1  Tablet By Mouth Twice A Day Per Dr Jolee Ewing 4)  Ascensia Elite Test  Strp (Glucose Blood) .... Test Blood Sugarfour Times Daily 5)  Doxepin Hcl 100 Mg Caps (Doxepin Hcl) .... Take 1 Capsule By Mouth At Bedtime 6)  Epipen 2-Pak 0.3 Mg/0.42ml (1:1000) Devi (Epinephrine Hcl (Anaphylaxis)) .... Inject 1 Pen Subcutaneously As Directed 7)  Lasix 80 Mg Tabs (Furosemide) .... Take 1 Tablet By Mouth Twice A Day 8)  Lexapro 10 Mg Tabs (Escitalopram Oxalate) .... Take 1 Tablet By Mouth Once A Day 9)  Metformin Hcl 500 Mg Tabs (Metformin Hcl) .... Take  1 Tablet By Mouth Twice A Day 10)  Potassium Chloride Crys Cr 20 Meq Tbcr (Potassium Chloride Crys Cr) .... Take 4  Tabs By Mouth in The Am, 4 At Noon, and 4 At [facility] 11)  Ranitidine Hcl 300 Mg Caps (Ranitidine Hcl) .... Take 1 Capsule By Mouth Once A Day 12)  Spironolactone 50 Mg Tabs (Spironolactone) .... Take 1 Tablet Daily in The Morning 13)  Zocor 10 Mg Tabs (Simvastatin) .... Take 1 Tablet By Mouth Once A Day 14)  Nexium 40 Mg  Cpdr (Esomeprazole Magnesium) .[name] Kitchen.. 1 Tablet By Mouth Twice Daily 15)  Calcium 500 500 Mg  Tabs (Calcium Carbonate) .[name] Kitchen.. 1 Tablet By Mouth Daily 16)  Methotrexate 2.5 Mg  Tabs (Methotrexate Sodium) .... Take As Directed By Dr. Charlestine Night 5 Mg On Saturday and Sunday 17)  Oxybutynin Chloride 5 Mg  Tabs (Oxybutynin Chloride) .[name] Kitchen.. 1 Tablet By Mouth Two Times A Day As Directed 18)  Lyrica 100 Mg  Caps (Pregabalin) .[name] Kitchen.. 1 Tab By Mouth Three Times A Day 19)  Lantus 100 Unit/ml Soln (Insulin Glargine) .... 50 Units Daily At Bedtime. Dispense One Month Supply. 20)  Warfarin Sodium 5 Mg Tabs (Warfarin Sodium) .... Take As Directed 21)  Folic Acid 1 Mg Tabs (Folic Acid) .[name] Kitchen.. 1 Tablet By Mouth Daily While On Methotrexate 22)  Clonidine Hcl 0.2 Mg Tabs (  Clonidine Hcl) .... 2 By Mouth At Bedtime For Depression 23)  Amitriptyline Hcl 25 Mg Tabs (Amitriptyline Hcl) .... Take One Tablet At Bedtime 24)  Doxepin Hcl 25 Mg Caps (Doxepin Hcl) .... Take 1 Capsule By Mouth Once A Day At Bedtime 25)  Iron 325 (65 Fe) Mg Tabs (Ferrous Sulfate) .... One By Mouth Three Times A Day 26)  Hydrochlorothiazide 25 Mg Tabs (Hydrochlorothiazide) .[name] Kitchen.. 1 Tab Two Times A Day 27)  Novolog 100 Unit/ml Soln (Insulin Aspart) .[name] Kitchen.. 12 Units Subcutaneous Injection  Prior To Breakfast and Lunch and Eveing Meal.  Disp: 1 Month Supply. 28)  Vitamin D 1000 Unit Tabs (Cholecalciferol) .... Take 1 Tabs Daily 29)  Bd Insulin Syringe Ultrafine 30g X 1/2" 1 Ml Misc (Insulin Syringe-Needle U-100) .... Dispense Supply For Four  Times Per Day Dosing of Insulin. 30)  Hydrocodone-Acetaminophen 5-325 Mg Tabs (Hydrocodone-Acetaminophen) .[name] Kitchen.. 1 By Mouth Up To 4 Times Per Day As Needed For Pain  Allergies (verified): 1)  ! Codeine 2)  ! Penicillin 3)  ! Sulfa 4)  ! * Lithium 5)  ! Valium 6)  ! * Cogentin 7)  ! Lisinopril (Lisinopril) 8)  ! * Hyddal 9)  Thioridazine Hcl (Thioridazine Hcl)  Past History:  Past medical, surgical, family and social histories (including risk factors) reviewed for relevance to current acute and chronic problems.  Past Medical History: Reviewed history from 08/19/2009 and no changes required. 1. Amenorrhea since 60 years old 2. Barretts Esophagus - EGD 8/07. (Repeat 2-3 years) 3. Nephrolithiasis 2/03 4. Colonoscopy- benign polyps (12/2000), repeat colonoscopy performed in 2007 5. Endometrial biopsy 10/03 benign columnar mucosa (limited material) 6. Asthma: PFT's (10/14/06)- mild obstructive airway dz, poor effort 7. Obesity-hypoventilation syndrome: uses CPAP at night. 8. Obesity 9. Diastolic CHF: EF [id number] on echo (5/10) 10.  Pulmonary embolus (4/10): ? trigger.  11.  Acute pericarditis/pleuritis (5/10): Echo (5/10) showed EF 55%, possible posterior HK, > 25% respiratory variation of mitral inflow suggesting some interventricular interdependence, IVC not significantly dilated.  There was a small organized pericardial effusion and a moderate left pleural effusion. Some evidence suggesting constrictive physiology.  ESR was 108. Patient treated with high dose ASA and colchicine.  12. CKD: last creatinine 1.12 13. Chronic anemia 14. Diabetes mellitus type II 15. Gout 16. Rheumatoid arthritis (followed by Dr. Charlestine Night). 17. Depression 18. GERD 19. HTN 20. Hyperlipidemia 21. Schizophrenia 22. Polypharmacy 23. Falls 24. Hypokalemia 25. Vitamin D 22 (10/6) 26. Fibromyalgia  Past Surgical History: Reviewed history from 05/21/2009 and no changes required. Appendectomy -  08/28/2001 Bladder stretching - 08/28/2001 Cholecystectomy - 08/28/2001 B/L knee replacement- Sept & Nov 2008 (Dr. Alvan Dame)  Family History: Reviewed history from 04/25/2009 and no changes required. 6 brothers and 6 sisters., Father died of black lung, Mother with bone cancer, One brother with lung cancer and another with` blood` cancer., One sister died of colon cancer., Strong FH of DM, CAD, and kidney problems  Social History: Reviewed history from 10/13/2006 and no changes required. Divorced for greater than 30 years.  Born in [place] but lived in [place] for most of her life. No children. Stopped smoking in 1973, no etoh, no drug use.  Pt was adopted.  Not working secondary to schizophrenia.  Niece, Helene Kelp, helps care for her.  Not sexually active.Smoking Status:  quit > 6 months  Review of Systems General:  Denies chills and fever. CV:  Denies chest pain or discomfort. Resp:  Denies cough and shortness of breath. GI:  Denies change  in bowel habits. Derm:  Denies lesion(s) and rash. Neuro:  Denies falling down, numbness, and tingling.  Physical Exam  General:  Well-developed, well-nourished, in no acute distress; alert, appropriate and cooperative throughout examination. Vitals reviewed. Lungs:  CTAB w/o wheeze or crackles  Heart:  Distant but regular with no murmurs or rubs. Abdomen:  +BS, soft NT, ND, no masses Extremities:  Compression stockings on both legs. Psych:  Normally interactive, good eye contact, and not anxious appearing.     Impression & Recommendations:  Problem # 1:  COUMADIN THERAPY (ICD-V58.61) Assessment Deteriorated Adjusted regimen. Recheck 2 weeks. Orders: [place]- Est  Level 4 [id number])  Problem # 2:  DIABETES MELLITUS, TYPE I, UNCONTROLLED (ICD-250.03) Assessment: Improved Increase mealtime insulin to 12 units before each meal. If BG consistenly over 200 at each meal after one week, increase mealtime to 15 units before each meal. Follow up in pharmacy clinic in 3-4  weeks. Her updated medication list for this problem includes:    Metformin Hcl 500 Mg Tabs (Metformin hcl) .[name] Kitchen... Take 1 tablet by mouth twice a day    Lantus 100 Unit/ml Soln (Insulin glargine) .[name] KitchenMarland KitchenMarland KitchenMarland Kitchen 50 units daily at bedtime. dispense one month supply.    Novolog 100 Unit/ml Soln (Insulin aspart) .[name] Kitchen... 12 units subcutaneous injection  prior to breakfast and lunch and eveing meal.  disp: 1 month supply.  Orders: [place]- Est  Level 4 (99214)  Problem # 3:  OSTEOARTHRITIS, MULTI SITES (ICD-715.98) Assessment: Improved Pain controlled on current regimen. Her updated medication list for this problem includes:    Hydrocodone-acetaminophen 5-325 Mg Tabs (Hydrocodone-acetaminophen) .[name] Kitchen... 1 by mouth up to 4 times per day as needed for pain  Orders: [place]- Est  Level 4 (99214)  Problem # 4:  DIABETIC PERIPHERAL NEUROPATHY (ICD-250.60) Assessment: Improved Pain controlled on current regimen. Her updated medication list for this problem includes:    Metformin Hcl 500 Mg Tabs (Metformin hcl) .[name] Kitchen... Take 1 tablet by mouth twice a day    Lantus 100 Unit/ml Soln (Insulin glargine) .[name] KitchenMarland KitchenMarland KitchenMarland Kitchen 50 units daily at bedtime. dispense one month supply.    Novolog 100 Unit/ml Soln (Insulin aspart) .[name] Kitchen... 12 units subcutaneous injection  prior to breakfast and lunch and eveing meal.  disp: 1 month supply.  Orders: [place]- Est  Level 4 [id number])  Problem # 5:  LEG EDEMA, BILATERAL (ICD-782.3) Assessment: Improved  Her updated medication list for this problem includes:    Lasix 80 Mg Tabs (Furosemide) .[name] Kitchen... Take 1 tablet by mouth twice a day    Spironolactone 50 Mg Tabs (Spironolactone) .[name] Kitchen... Take 1 tablet daily in the morning    Hydrochlorothiazide 25 Mg Tabs (Hydrochlorothiazide) .[name] Kitchen... 1 tab two times a day  Orders: [place]- Est  Level 4 (99214)  Complete Medication List: 1)  Advair Diskus 500-50 Mcg/dose Misc (Fluticasone-salmeterol) .[name] Kitchen.. 1 puff inh two times a day 2)  Albuterol 90 Mcg/act Aers (Albuterol) .... Inhale  1 puff using inhaler four times a day 3)  Allopurinol 300 Mg Tabs (Allopurinol) .... Take 1  tablet by mouth twice a day per dr trueslow 4)  Ascensia Elite Test Strp (Glucose blood) .... Test blood sugarfour times daily 5)  Doxepin Hcl 100 Mg Caps (Doxepin hcl) .... Take 1 capsule by mouth at bedtime 6)  Epipen 2-pak 0.3 Mg/0.35ml (1:1000) Devi (Epinephrine hcl (anaphylaxis)) .... Inject 1 pen subcutaneously as directed 7)  Lasix 80 Mg Tabs (Furosemide) .... Take 1 tablet by mouth twice a day 8)  Lexapro 10 Mg Tabs (Escitalopram  oxalate) .... Take 1 tablet by mouth once a day 9)  Metformin Hcl 500 Mg Tabs (Metformin hcl) .... Take 1 tablet by mouth twice a day 10)  Potassium Chloride Crys Cr 20 Meq Tbcr (Potassium chloride crys cr) .... Take 4  tabs by mouth in the am, 4 at noon, and 4 at hs 11)  Ranitidine Hcl 300 Mg Caps (Ranitidine hcl) .... Take 1 capsule by mouth once a day 12)  Spironolactone 50 Mg Tabs (Spironolactone) .... Take 1 tablet daily in the morning 13)  Zocor 10 Mg Tabs (Simvastatin) .... Take 1 tablet by mouth once a day 14)  Nexium 40 Mg Cpdr (Esomeprazole magnesium) .[name] Kitchen.. 1 tablet by mouth twice daily 15)  Calcium 500 500 Mg Tabs (Calcium carbonate) .[name] Kitchen.. 1 tablet by mouth daily 16)  Methotrexate 2.5 Mg Tabs (Methotrexate sodium) .... Take as directed by dr. Charlestine Night 5 mg on saturday and sunday 17)  Oxybutynin Chloride 5 Mg Tabs (Oxybutynin chloride) .[name] Kitchen.. 1 tablet by mouth two times a day as directed 18)  Lyrica 100 Mg Caps (Pregabalin) .[name] Kitchen.. 1 tab by mouth three times a day 19)  Lantus 100 Unit/ml Soln (Insulin glargine) .... 50 units daily at bedtime. dispense one month supply. 20)  Warfarin Sodium 5 Mg Tabs (Warfarin sodium) .... Take as directed 21)  Folic Acid 1 Mg Tabs (Folic acid) .[name] Kitchen.. 1 tablet by mouth daily while on methotrexate 22)  Clonidine Hcl 0.2 Mg Tabs (Clonidine hcl) .... 2 by mouth at bedtime for depression 23)  Amitriptyline Hcl 25 Mg Tabs (Amitriptyline hcl)  .... Take one tablet at bedtime 24)  Doxepin Hcl 25 Mg Caps (Doxepin hcl) .... Take 1 capsule by mouth once a day at bedtime 25)  Iron 325 (65 Fe) Mg Tabs (Ferrous sulfate) .... One by mouth three times a day 26)  Hydrochlorothiazide 25 Mg Tabs (Hydrochlorothiazide) .[name] Kitchen.. 1 tab two times a day 27)  Novolog 100 Unit/ml Soln (Insulin aspart) .[name] Kitchen.. 12 units subcutaneous injection  prior to breakfast and lunch and eveing meal.  disp: 1 month supply. 28)  Vitamin D 1000 Unit Tabs (Cholecalciferol) .... Take 1 tabs daily 29)  Bd Insulin Syringe Ultrafine 30g X 1/2" 1 Ml Misc (Insulin syringe-needle u-100) .... Dispense supply for four times per day dosing of insulin. 30)  Hydrocodone-acetaminophen 5-325 Mg Tabs (Hydrocodone-acetaminophen) .[name] Kitchen.. 1 by mouth up to 4 times per day as needed for pain  Other Orders: [facility]R/PT-[facility] [id number])  Patient Instructions: 1)  Please increase your mealtime insulin to 12 units. If your mealtime sugar remains above 200 after one week, increase to 15 units.   ANTICOAGULATION RECORD PREVIOUS REGIMEN & LAB RESULTS Anticoagulation Diagnosis:  PE on  11/22/2008 Previous INR Goal Range:  2-3 on  11/22/2008 Previous INR:  2.1 on  08/18/2009 Previous Coumadin Dose(mg):  5mg  tablets on  11/25/2008 Previous Regimen:  continue:  5 mg - M, W, F;   2.5 mg - other days on  08/18/2009 Previous Coagulation Comments:  pt fell in the bathroom  yesterday hitting her head on the portable heater,  no symptoms of bleeding, Dr. Juleen China saw pt and questioned about fall  on  05/19/2009  NEW REGIMEN & LAB RESULTS Current INR: 1.6 Regimen: 5 mg x 2 days; then resume:  5 mg - M, W, F;  2.5 mg -other days  Provider: Juleen China Repeat testing in: 2 weeks  09-25-09 Other Comments: ...............test performed by......[name] KitchenBonnie A. Martinique, MLS (ASCP)cm   Dose has been reviewed with patient  or caretaker during this visit. Reviewed by: Dr. Juleen China   Prevention & Chronic Care Immunizations    Influenza vaccine: Fluvax 3+  (05/21/2009)   Influenza vaccine due: 06/19/2008    Tetanus booster: 08/16/2001: Done.   Tetanus booster due: 08/17/2011    Pneumococcal vaccine: Done.  (09/16/2004)   Pneumococcal vaccine due: None  Colorectal Screening   Hemoccult: Done.  (12/14/2000)   Hemoccult due: Not Indicated    Colonoscopy: Done.  (03/16/2006)   Colonoscopy due: 03/16/2016  Other Screening   Pap smear: Done.  (08/16/2006)   Pap smear action/deferral: Deferred  (05/21/2009)   Pap smear due: 08/16/2009    Mammogram: ASSESSMENT: Negative - BI-RADS 1^MM DIGITAL SCREENING  (03/25/2009)   Mammogram action/deferral: Not indicated  (09/11/2009)   Mammogram due: 03/25/2010   Smoking status: quit > 6 months  (09/11/2009)  Diabetes Mellitus   HgbA1C: 10.3  (08/18/2009)   Hemoglobin A1C due: 09/30/2007    Eye exam: Not documented    Foot exam: yes  (07/04/2008)   Foot exam action/deferral: Do today   High risk foot: Yes  (09/11/2009)   Foot care education: Done  (09/11/2009)   Foot exam due: 05/03/2008    Urine microalbumin/creatinine ratio: Not documented    Diabetes flowsheet reviewed?: Yes   Progress toward A1C goal: Unchanged  Lipids   Total Cholesterol: 169  (06/30/2009)   LDL: 49  (06/30/2009)   LDL Direct: 96  (02/28/2008)   HDL: 43  (06/30/2009)   Triglycerides: 384  (06/30/2009)    SGOT (AST): 19  (05/21/2009)   SGPT (ALT): 39  (05/21/2009)   Alkaline phosphatase: 99  (05/21/2009)   Total bilirubin: 0.5  (05/21/2009)    Lipid flowsheet reviewed?: Yes   Progress toward LDL goal: Unchanged  Hypertension   Last Blood Pressure: 132 / 78  (09/11/2009)   Serum creatinine: 0.90  (08/29/2009)   Serum potassium 3.5  (08/29/2009)    Hypertension flowsheet reviewed?: Yes   Progress toward BP goal: Unchanged  Self-Management Support :   Personal Goals (by the next clinic visit) :     Personal A1C goal: 8  (07/13/2009)     Personal blood pressure goal:  130/80  (05/21/2009)     Personal LDL goal: 100  (05/21/2009)    Patient will work on the following items until the next clinic visit to reach self-care goals:     Medications and monitoring: take my medicines every day  (09/11/2009)     Eating: drink diet soda or water instead of juice or soda, eat more vegetables, use fresh or frozen vegetables, eat foods that are low in salt, eat baked foods instead of fried foods, eat fruit for snacks and desserts, limit or avoid alcohol  (09/11/2009)    Diabetes self-management support: Written self-care plan  (09/11/2009)   Diabetes care plan printed    Hypertension self-management support: Written self-care plan  (09/11/2009)   Hypertension self-care plan printed.    Hypertension self-management support not done because: Good outcomes  (07/25/2009)    Lipid self-management support: Written self-care plan  (09/11/2009)   Lipid self-care plan printed.    Lipid self-management support not done because: Good outcomes  (07/25/2009)   Nursing Instructions: Diabetic foot exam today    Diabetic Foot Exam Foot Inspection  Diabetic Foot Care Education Patient educated on appropriate care of diabetic feet.   High Risk Feet? Yes

## 2010-09-15 NOTE — Letter (Signed)
Summary: [facility]- CPAP  [facility]- [facility]   Imported By: Audie Clear 12/17/2008 15:34:16  _____________________________________________________________________  External Attachment:    Type:   Image     Comment:   External Document

## 2010-09-15 NOTE — Miscellaneous (Signed)
Summary: Asthma Update - Persistent     New Problems: ASTHMA, PERSISTENT (ICD-493.90)   New Problems: ASTHMA, PERSISTENT (ICD-493.90)

## 2010-09-15 NOTE — Miscellaneous (Signed)
Summary: Orders Update  Clinical Lists Changes  Orders: Added new Test order of INR/PT-[facility] [id number]) - Signed

## 2010-09-15 NOTE — Letter (Signed)
Summary: [facility]  [facility]   Imported By: Sallee Provencal 05/08/2010 15:11:34  _____________________________________________________________________  External Attachment:    Type:   Image     Comment:   External Document

## 2010-09-15 NOTE — Assessment & Plan Note (Signed)
Summary: lightheaded,dizziness,low potassium, low bp reading/ls   Vital Signs:  Patient Profile:   60 Years Old Female Weight:      215 pounds Temp:     98.2 degrees F Pulse rate:   102 / minute BP sitting:   122 / 75  Pt. in pain?   no  Vitals Entered By: Margaretha Glassing, RN                PCP:  Dixon Boos MD  Chief Complaint:  low bp at Dr. Elmon Else office .  History of Present Illness: Pt is here today for fir low blood pressure at another doctor's office.  BP today was 76/46.  Pt was also told that her potassium has been low.  Pt recently had her toenail removed last Thursday.  No medications taken this morning.  Pt recently changed from Lisinopril to Diovan.  Pt takes the following for blood pressure:  Diovan , Furosemide 80mg  two times a day, Potassium Chloride 6 tablets a day, Spironolactone 50mg   1 tab a day.  Clonidine 0.2 2 tab at bedtime.  HCTZ 25mg  1 tab two times a day.    + dizzy x 3 days, lots of new stress.  No chest pain.       Past Medical History:    Reviewed history from 12/01/2006 and no changes required:       amenorrhea since 60 years old       Barrets Esophagus - EGD 8/07. (Repeat 2-3 years)       CA-125 12 (less than 35 normal) 11/28/2000       CBC wnl and Neg Rheum w/u and neg CCP Ab (8/06)       Nephrolithiasis 2/03       stroke at age 44?       Colonoscopy  benign polyps (12/2000)        echo-5/05-poor study, grossly nL        Endometrial biopsy 10/03 benign columnar mucosa (limited material)       stress test 04/2006- negative per patient        PFT's (10/14/06)- mild obstructive airway dz, poor effort  Past Surgical History:    Reviewed history from 12/01/2006 and no changes required:       Appendectomy - 08/28/2001       bladder stretching - 08/28/2001       Cholecystectomy - 08/28/2001       Colonoscopy  benign polyps (12/2000) - 10/30/2001       echo-5/05-poor study, grossly nL - 01/09/2004       Endometrial biopsy 10/03 benign columnar mucosa  (limited material) - 06/11/2002       stress test 04/2006- negative per patient - 06/14/2006   Family History:    Reviewed history from 10/13/2006 and no changes required:       6 brothers and 6 sisters., Father died of black lung, Mother with bone cancer, One brother with lung cancer and another with` blood` cancer., One sister died of colon cancer., Strong FH of DM, CAD, MI`s and kidney problems  Social History:    Reviewed history from 10/13/2006 and no changes required:       Divorced for greater than 30 years.  Born in [place] but lived in [place] for most of her life. No children. Stopped smoking in 1973, no etoh, no drug use.  Pt was adopted.  Not working secondary to schizophrenia.  Niece, Helene Kelp, helps care for her.  Not sexually active.  Physical Exam  General:     Well-developed,well-nourished,in no acute distress; alert,appropriate and cooperative throughout examination Lungs:     Normal respiratory effort, chest expands symmetrically. Lungs are clear to auscultation, no crackles or wheezes. Heart:     Normal rate and regular rhythm. S1 and S2 normal without gallop, murmur, click, rub or other extra sounds.    Impression & Recommendations:  Problem # 1:  HYPERTENSION, BENIGN SYSTEMIC (ICD-401.1) Assessment: Deteriorated low today.  will hold medications.  resumer per pt instructions.   Orders: [facility]- Est Level  3 [id number])   Problem # 2:  HYPOKALEMIA (ICD-276.8) see pt instructions.   Orders: [facility]- Est Level  3 ([id number]) Comp Met-[facility] [id number]) CBC-[facility] [id number]) Uric Acid-[facility] [id number])  Future Orders: Basic Met-[facility] [id number]) ... 02/14/2008    Patient Instructions: 1)  Please schedule a follow-up appointment in 3 days (monday July 14 for Mack visit with BP check). 2)  STOP Taking HCTZ until notified.  3)  Take potassium chloride 46meq tabs take 2 additional  tablets today and start going with 2 tablets 4 times a day until next week.   4)  You may take your Lasix  when you get home.  This is the only blood pressure medication to take today.   5)  Starting tomorrow, Saturday, resume diovan, spironolactone, clonidine, Lasix as usual.   6)  If blood pressure is top number is less than 90, then do not take your diovan or clonidine.

## 2010-09-15 NOTE — Consult Note (Signed)
Summary: Hurley Cisco, MD   Hurley Cisco, MD   Imported By: Samara Snide 07/17/2010 11:47:12  _____________________________________________________________________  External Attachment:    Type:   Image     Comment:   External Document

## 2010-09-15 NOTE — Assessment & Plan Note (Signed)
Summary: cough with lisinopril/[place]   Vital Signs:  Patient profile:   60 year old female Weight:      209.7 pounds Pulse rate:   100 / minute BP sitting:   104 / 66  (right arm)  Vitals Entered By: [name] CMA, (April 22, 2009 10:09 AM) CC: c/o cough since taking lisinopril again. was fine on diovan. Is Patient Diabetic? Yes  Pain Assessment Patient in pain? no        Primary Care Awa Bachicha:  Briscoe Deutscher DO  CC:  c/o cough since taking lisinopril again. was fine on diovan.[name] Kitchen  History of Present Illness: Cough to ACE inhibitors, restarted last week because of chronically low K, severe cough ensued.  Has been on Diovan in the past.  Protective of changes in meds.    BP low today.  Habits & Providers  Alcohol-Tobacco-Diet     Tobacco Status: quit  Allergies: 1)  ! Codeine 2)  ! Penicillin 3)  ! Sulfa 4)  ! * Lithium 5)  ! Valium 6)  ! * Cogentin 7)  ! Lisinopril (Lisinopril)  Social History: Smoking Status:  quit  Review of Systems       Cramps in legs, edema, severe cough  Physical Exam  General:  Alert, chrnically ill Lungs:  no wheeze   Impression & Recommendations:  Problem # 1:  HYPOKALEMIA (ICD-276.8) ACE started in attempt to improve chroniclly low K.  History of severe cough to ACE and it started as soon as she began the medication.  Stop ACE, return to see primary MD for any further additions.  Return in one week for K check with PT lab, in 2 weeks with primary MD. Discussed foods rich in K, neice described limited income and food stamps makes fresh foods unobtainable for the most part. Orders: [place]- Est Level  2 ([id number])Future Orders: Basic Met[facility] [id number]) ... 04/30/2010  Complete Medication List: 1)  Advair Diskus 500-50 Mcg/dose Misc (Fluticasone-salmeterol) .[name] Kitchen.. 1 puff inh two times a day 2)  Albuterol 90 Mcg/act Aers (Albuterol) .... Inhale 1 puff using inhaler four times a day 3)  Allopurinol 300 Mg Tabs  (Allopurinol) .... Take 1  tablet by mouth twice a day per dr trueslow 4)  Ascensia Elite Test Strp (Glucose blood) .... Test blood sugar twice daily 5)  Darvocet-n 100 100-650 Mg Tabs (Propoxyphene n-apap) .... Take 1 tablet by mouth every six hours- needs appointment before any more refills 6)  Doxepin Hcl 100 Mg Caps (Doxepin hcl) .... Take 1 capsule by mouth at bedtime 7)  Epipen 2-pak 0.3 Mg/0.9ml (1:1000) Devi (Epinephrine hcl (anaphylaxis)) .... Inject 1 pen subcutaneously as directed 8)  Glipizide 10 Mg Tb24 (Glipizide) .... Take 1 tablet by mouth once a day 9)  Lasix 80 Mg Tabs (Furosemide) .... Take 1 tablet by mouth twice a day 10)  Lexapro 10 Mg Tabs (Escitalopram oxalate) .... Take 1 tablet by mouth once a day 11)  Metformin Hcl 500 Mg Tabs (Metformin hcl) .... Take 1 tablet by mouth twice a day 12)  Potassium Chloride Crys Cr 20 Meq Tbcr (Potassium chloride crys cr) .... Take 2 tabs by mouth in the am, 3 at noon, and 3 at hs 13)  Ranitidine Hcl 300 Mg Caps (Ranitidine hcl) .... Take 1 capsule by mouth once a day 14)  Spironolactone 50 Mg Tabs (Spironolactone) .... Take 1 tablet by mouth once a day 15)  Zocor 10 Mg Tabs (Simvastatin) .... Take 1 tablet by mouth  once a day 16)  Nexium 40 Mg Cpdr (Esomeprazole magnesium) .[name] Kitchen.. 1 tablet by mouth twice daily 17)  Calcium 500 500 Mg Tabs (Calcium carbonate) .[name] Kitchen.. 1 tablet by mouth daily 18)  Methotrexate 2.5 Mg Tabs (Methotrexate sodium) .... Take as directed by dr. Charlestine Night 19)  Oxybutynin Chloride 5 Mg Tabs (Oxybutynin chloride) .[name] Kitchen.. 1 tablet by mouth two times a day as directed 20)  Lyrica 100 Mg Caps (Pregabalin) .[name] Kitchen.. 1 tab by mouth three times a day 21)  Lantus 100 Unit/ml Soln (Insulin glargine) .... 42 units daily at bedtime 22)  Warfarin Sodium 5 Mg Tabs (Warfarin sodium) .... Take as directed 23)  Folic Acid 1 Mg Tabs (Folic acid) .[name] Kitchen.. 1 tablet by mouth daily while on methotrexate 24)  Clonidine Hcl 0.2 Mg Tabs (Clonidine hcl) ....  2 by mouth at bedtime for depression 25)  Amitriptyline Hcl 25 Mg Tabs (Amitriptyline hcl) .... Take one tablet at bedtime 26)  Prednisone 5 Mg Tabs (Prednisone) .[name] Kitchen.. 1 daily by dr Jolee Ewing 27)  Ferrous Sulfate 325 (65 Fe) Mg Tbec (Ferrous sulfate) .... 3x per day 28)  Doxepin Hcl 25 Mg Caps (Doxepin hcl) .... Take 1 capsule by mouth once a day at bedtime 29)  Propoxyphene N-apap 100-650 Mg Tabs (Propoxyphene n-apap) .... At bedtime 30)  Iron 325 (65 Fe) Mg Tabs (Ferrous sulfate) .... One by mouth three times a day  Patient Instructions: 1)  Please schedule a follow-up appointment in 2 weeks.  2)  Stop lininopril 3)  Begin K rich foods

## 2010-09-15 NOTE — Assessment & Plan Note (Signed)
Summary: Cardiology Office Visit   Visit Type:  Follow-up Primary Provider:  Dixon Boos MD  CC:  Post-Hospital.  History of Present Illness: 60 year old Caucasian female with recent history of pulmonary embolism on Coumadin therapy who was hospitalized earlier this month for dyspnea and found to have a large left pleural effusion. He also had chest pain suspicious for pericarditis. She was seen by Dr. Aundra Dubin in consultationand echocardiogram was performed showing normal LV function with component of ventricular impaired dependence. Ultimately it was felt that the patient's pleural effusion as well as pericarditis was likely secondary to her pulmonary embolism. During her hospitalization was recommended that she be maintained on high-dose aspirin however the patient missed understood her discharge instructions and was not taking aspirin. Since her discharge she has had significant improvement in her exercise tolerance. She recently saw her primary care provider and a chest x-ray was performed showing near total improvement of the pleural effusion. Despite this she reports that she still has some good days and bad days. That day she will fatigue easily and feels very sleepy sometimes nodding off at moments notice. She also experiences dyspnea exertion but overall this is significantly improved compared to symptoms prior to hospitalization. She denies PND or orthopnea. She reports a good appetite. She denies dizziness or syncope. She has mild chronic low extremity edema.  Current Medications (verified): 1)  Advair Diskus 500-50 Mcg/dose Misc (Fluticasone-Salmeterol) .[name] Kitchen.. 1 Puff Inh Two Times A Day 2)  Albuterol 90 Mcg/act Aers (Albuterol) .... Inhale 1 Puff Using Inhaler Four Times A Day 3)  Allopurinol 300 Mg Tabs (Allopurinol) .... Take 2  Tablet By Mouth Twice A Day Per Dr Jolee Ewing 4)  Ascensia Elite Test  Strp (Glucose Blood) .... Test Blood Sugar Twice Daily 5)  Darvocet-N 100 100-650 Mg Tabs  (Propoxyphene N-Apap) .... Take 1 Tablet By Mouth Every Six Hours- Needs Appointment Before Any More Refills 6)  Doxepin Hcl 100 Mg Caps (Doxepin Hcl) .... Take 1 Capsule By Mouth At Bedtime 7)  Epipen 2-Pak 0.3 Mg/0.41ml (1:1000) Devi (Epinephrine Hcl (Anaphylaxis)) .... Inject 1 Pen Subcutaneously As Directed 8)  Glipizide 10 Mg Tb24 (Glipizide) .... Take 1 Tablet By Mouth Once A Day 9)  Hydrochlorothiazide 25 Mg Tabs (Hydrochlorothiazide) .... Take 1 Tablet By Mouth Twice A Day 10)  Lasix 80 Mg Tabs (Furosemide) .... Take 1 Tablet By Mouth Twice A Day 11)  Lexapro 10 Mg Tabs (Escitalopram Oxalate) .... Take 1 Tablet By Mouth Once A Day 12)  Metformin Hcl 500 Mg Tabs (Metformin Hcl) .... Take 1 Tablet By Mouth Twice A Day 13)  Potassium Chloride Crys Cr 20 Meq Tbcr (Potassium Chloride Crys Cr) .... Take 3 Tablet By Mouth Twice Times A Day and 2 Tablets Once Daily 14)  Ranitidine Hcl 300 Mg Caps (Ranitidine Hcl) .... Take 1 Capsule By Mouth Once A Day 15)  Spironolactone 50 Mg Tabs (Spironolactone) .... Take 1 Tablet By Mouth Once A Day 16)  Zocor 10 Mg Tabs (Simvastatin) .... Take 1 Tablet By Mouth Once A Day 17)  Nexium 40 Mg  Cpdr (Esomeprazole Magnesium) .[name] Kitchen.. 1 Tablet By Mouth Twice Daily 18)  Calcium 500 500 Mg  Tabs (Calcium Carbonate) .[name] Kitchen.. 1 Tablet By Mouth Daily 19)  Methotrexate 2.5 Mg  Tabs (Methotrexate Sodium) .... Take As Directed By Dr. Charlestine Night 20)  Oxybutynin Chloride 5 Mg  Tabs (Oxybutynin Chloride) .[name] Kitchen.. 1 Tablet By Mouth Two Times A Day As Directed 21)  Lyrica 100 Mg  Caps (Pregabalin) .[name] KitchenMarland KitchenMarland Kitchen  1 Tab By Mouth Three Times A Day 22)  Lantus 100 Unit/ml Soln (Insulin Glargine) .... 40 Units Daily At Bedtime 23)  Warfarin Sodium 5 Mg Tabs (Warfarin Sodium) .... Take As Directed 24)  Folic Acid 1 Mg Tabs (Folic Acid) .[name] Kitchen.. 1 Tablet By Mouth Daily While On Methotrexate 25)  Clonidine Hcl 0.2 Mg Tabs (Clonidine Hcl) .... 2 By Mouth At Bedtime For Depression 26)  Amitriptyline Hcl 25 Mg Tabs  (Amitriptyline Hcl) .... Take One Tablet At Bedtime 27)  Prednisone 5 Mg Tabs (Prednisone) .[name] Kitchen.. 1 Daily By Dr Jolee Ewing 28)  Ferrous Sulfate 325 (65 Fe) Mg  Tbec (Ferrous Sulfate) .... 3x Per Day 29)  Doxepin Hcl 25 Mg Caps (Doxepin Hcl) .... Take 1 Capsule By Mouth Once A Day At Bedtime 30)  Propoxyphene N-Apap 100-650 Mg Tabs (Propoxyphene N-Apap) .... At Bedtime  Allergies: 1)  ! Codeine 2)  ! Penicillin 3)  ! Sulfa 4)  ! * Lithium 5)  ! Valium 6)  ! * Cogentin  Review of Systems       Ss as per hpi.  OTW all systems reviewed and negative.  Vital Signs:  Patient profile:   60 year old female Height:      60 inches Weight:      204.75 pounds BMI:     40.13 Pulse rate:   99 / minute Pulse rhythm:   regular Resp:     18 per minute BP sitting:   130 / 90  (left arm) Cuff size:   large  Vitals Entered By: Sidney Ace (Jan 06, 2009 12:34 PM)  Physical Exam  General:  Well developed, well nourished, in no acute distress. Head:  HEENT: Normal Neck:  neck is obese and difficult to assess JVP. No bruits. Lungs:  respirations regular and unlabored with somewhat diminished breath sounds in the bases left worse than right. Heart:  regular S1-S2. No S3-S4 or murmurs. Abdomen:  obese soft. Nontender, nondistended bowel sounds present Msk:  moves all extremities. Pulses:  pulses normal in all 4 extremities Extremities:  no clubbing or cyanosis with 1+ bilateral lower extremity edema. Neurologic:  Alert and oriented x 3. Skin:  Intact without lesions or rashes. Cervical Nodes:  no significant adenopathy Psych:  Normal affect.   Impression & Recommendations:  Problem # 1:  PERICARDITIS (ICD-423.9) patient has had significant improvement. She has had no further chest pain and has had improved exercise tolerance. She had normal LV function by echocardiogram earlier this month. Because there was a component of ventricular anterior dependence plan to obtain repeat 2-D echocardiogram  in approximately 2 months. She will followup with Dr. Aundra Dubin in 3 months. Orders: Echocardiogram (Echo)  Problem # 2:  HYPERTENSION, BENIGN SYSTEMIC (ICD-401.1) continue current medications.  Problem # 3:  PLEURAL EFFUSION (ICD-511.9) a recent chest x-ray has shown improvement.  Problem # 4:  VENOUS INSUFFICIENCY, CHRONIC (ICD-459.81) appears to be stable.  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  Patient Instructions: 1)  Your physician recommends that you schedule a follow-up appointment in: 3 months with Dr. Aundra Dubin 2)  Your physician has requested that you have an echocardiogram.  Echocardiography is a painless test that uses sound waves to create images of your heart. It provides your doctor with information about the size and shape of your heart and how well your heart's chambers and valves are working.  This procedure takes approximately one hour. There are no restrictions  for this procedure. To be done two months from now

## 2010-09-15 NOTE — Assessment & Plan Note (Signed)
Summary: surgery clearance    Vital Signs:  Patient Profile:   60 Years Old Female Height:     61 inches Weight:      206.3 pounds Temp:     97.2 degrees F Pulse rate:   106 / minute BP sitting:   113 / 74  (left arm)  Pt. in pain?   no  Vitals Entered By: Geanie Cooley RN (May 04, 2007 1:55 PM)                  PCP:  Dixon Boos MD  Chief Complaint:  pre op rt knee.  History of Present Illness: [name] has been doing well: 1.  plans R knee replacement by Dr. Alvan Dame at end of month.  Referred by Dr. Charlestine Night.  Wants surgical clearance.  Says she has gone this week to [place] and had ekg that was normal and a BMET revealed K 2.9, otherwise she says her labs were normal.  2. Diabetes: fasting: 130-140, after meals: 200-300, occasionally 400.  Occasionally has episodes where she wakes up shaking, checkes blood sugar and it is in 70's.  taking metformin 500 two times a day + glipizide 10mg  once daily.  + numbness/tingling in feet bilaterally.  3.  asthma- rarely has to use albuterol inhaler       Past Medical History:    amenorrhea since 60 years old    Barrets Esophagus - EGD 8/07. (Repeat 2-3 years)    CA-125 - normal 11/28/2000    CBC wnl and Neg Rheum w/u and neg CCP Ab (8/06)    Nephrolithiasis 2/03    stroke at age 60???    Colonoscopy  benign polyps (12/2000)     echo-5/05-poor study, grossly nL     Endometrial biopsy 10/03 benign columnar mucosa (limited material)    stress test 04/2006- negative per patient     PFT's (10/14/06)- mild obstructive airway dz, poor effort  Past Surgical History:    Appendectomy - 08/28/2001    bladder stretching - 08/28/2001    Cholecystectomy - 08/28/2001              Physical Exam  General:     NAD, alert, pleasant Lungs:     normal respiratory effort Extremities:     3+ pitting edema to kneew bilaterally, chronic skin changes from venous insufficiency  Diabetes Management Exam:    Foot Exam (with socks  and/or shoes not present):       Sensory-Monofilament:          Left foot: diminished          Right foot: diminished       Inspection:          Left foot: normal          Right foot: normal       Nails:          Left foot: normal          Right foot: normal    Impression & Recommendations:  Problem # 1:  OSTEOARTHRITIS, MULTI SITES (ICD-715.98) Assessment: Deteriorated plans knee replacement, right then left by Dr. Alvan Dame.  Her premorbid conditions are many (diabetes, HTN, asthma).  As far as surgical clearance, her asthma is well-controlled, rarely has to use inhaler. Her blood pressure is at goal.  She has chronic tachycardia with HR 100's.  No known h/o heart disease.   She was evaluated by Dr. Doylene Canard with normal cardiolyte in November 2007.  I  expect she will do well peri-operatively. Her updated medication list for this problem includes:    Darvocet-n 100 100-650 Mg Tabs (Propoxyphene n-apap) .[name] Kitchen... Take 1 tablet by mouth every six hours    Tgt Aspirin 81 Mg Tbec (Aspirin) .[name] Kitchen... Take 1 tablet by mouth once a day  Orders: [place]- Est  Level 4 [id number])   Problem # 2:  HYPOKALEMIA (ICD-276.8) Assessment: Deteriorated K was 2.9 this week.  She will permanently incresae her KCl from 2 tablets three times a day to 3 tablets twice daily + 2 tablets once daily.  Secondary to multiple diuretics.  She has been unable to adjust diuretic medications at all.  She's already on spironolactone and ACEi to help increase her K+.  Problem # 3:  HYPERTENSION, BENIGN SYSTEMIC (ICD-401.1) Assessment: Improved at goal today.  cont current regimen. Her updated medication list for this problem includes:    Clonidine Hcl 0.2 Mg Tabs (Clonidine hcl) .[name] Kitchen... Take 2 tablet by mouth every night    Diovan 40 Mg Tabs (Valsartan) .[name] Kitchen... Take 1 tablet by mouth once a day    Hydrochlorothiazide 25 Mg Tabs (Hydrochlorothiazide) .[name] Kitchen... Take 1 tablet by mouth twice a day    Lasix 80 Mg Tabs (Furosemide) .[name] Kitchen... Take 1  tablet by mouth twice a day    Lisinopril 20 Mg Tabs (Lisinopril) .[name] Kitchen... Take 1 tablet by mouth once a day    Spironolactone 50 Mg Tabs (Spironolactone) .[name] Kitchen... Take 1 tablet by mouth once a day  Orders: [place]- Est  Level 4 [id number])   Problem # 4:  DIABETES MELLITUS II, UNCOMPLICATED ([id number]) Assessment: Deteriorated sugars remain elevated.  will increase metformin to 100mg  two times a day and continue glucotrol 10mg  once daily.  Reminded to check feet daily since she has neuropathy. Her updated medication list for this problem includes:    Diovan 40 Mg Tabs (Valsartan) .[name] Kitchen... Take 1 tablet by mouth once a day    Glipizide 10 Mg Tb24 (Glipizide) .[name] Kitchen... Take 1 tablet by mouth once a day    Lisinopril 20 Mg Tabs (Lisinopril) .[name] Kitchen... Take 1 tablet by mouth once a day    Metformin Hcl 500 Mg Tabs (Metformin hcl) .[name] Kitchen... Take 1 tablet by mouth twice a day    Tgt Aspirin 81 Mg Tbec (Aspirin) .[name] Kitchen... Take 1 tablet by mouth once a day  Orders: [facility] [id number]) [place]- Est  Level 4 (99214)   Problem # 5:  HYPERCHOLESTEROLEMIA (ICD-272.0) Assessment: Unchanged started on zocor in novem 1007 for LDL > 150.  Pt will come back for fasting labs to assess medication effects. Her updated medication list for this problem includes:    Zocor 10 Mg Tabs (Simvastatin) .[name] Kitchen... Take 1 tablet by mouth once a day  Future Orders: [facility] [id number]) ... 05/02/2008   Complete Medication List: 1)  Advair Diskus 250-50 Mcg/dose Misc (Fluticasone-salmeterol) .... Inhale 1 puff as directed twice a day 2)  Albuterol 90 Mcg/act Aers (Albuterol) .... Inhale 1 puff using inhaler four times a day 3)  Allopurinol 300 Mg Tabs (Allopurinol) .... Take 1 tablet by mouth twice a day 4)  Amitriptyline Hcl 25 Mg Tabs (Amitriptyline hcl) .... Take 1 tablet by mouth every night 5)  Ascensia Elite Test Strp (Glucose blood) .[name] Kitchen.. 1 strip once a day 6)  Clonidine Hcl 0.2 Mg Tabs (Clonidine hcl) .... Take 2 tablet by mouth every night 7)   Colchicine 0.6 Mg Tabs (Colchicine) .... Take 1 tablet by mouth twice a day 8)  Darvocet-n 100  100-650 Mg Tabs (Propoxyphene n-apap) .... Take 1 tablet by mouth every six hours 9)  Diovan 40 Mg Tabs (Valsartan) .... Take 1 tablet by mouth once a day 10)  Doxepin Hcl 100 Mg Caps (Doxepin hcl) .... Take 1 capsule by mouth at bedtime 11)  Epipen 2-pak 0.3 Mg/0.71ml (1:1000) Devi (Epinephrine hcl (anaphylaxis)) .... Inject 1 pen subcutaneously as directed 12)  Glipizide 10 Mg Tb24 (Glipizide) .... Take 1 tablet by mouth once a day 13)  Hydrochlorothiazide 25 Mg Tabs (Hydrochlorothiazide) .... Take 1 tablet by mouth twice a day 14)  Lasix 80 Mg Tabs (Furosemide) .... Take 1 tablet by mouth twice a day 15)  Lexapro 10 Mg Tabs (Escitalopram oxalate) .... Take 1 tablet by mouth once a day 16)  Lisinopril 20 Mg Tabs (Lisinopril) .... Take 1 tablet by mouth once a day 17)  Metformin Hcl 500 Mg Tabs (Metformin hcl) .... Take 1 tablet by mouth twice a day 18)  Nystatin 100000 Unit/gm Crea (Nystatin) .... Apply 1 a small amount to skin three times a day 19)  Potassium Chloride Crys Cr 20 Meq Tbcr (Potassium chloride crys cr) .... Take 3 tablet by mouth twice times a day and 2 tablets once daily 20)  Prempro 0.3-1.5 Mg Tabs (Conj estrog-medroxyprogest ace) .... Take 1 tablet by mouth once a day 21)  Ranitidine Hcl 300 Mg Caps (Ranitidine hcl) .... Take 1 capsule by mouth once a day 22)  Spironolactone 50 Mg Tabs (Spironolactone) .... Take 1 tablet by mouth once a day 23)  Tgt Aspirin 81 Mg Tbec (Aspirin) .... Take 1 tablet by mouth once a day 24)  Zocor 10 Mg Tabs (Simvastatin) .... Take 1 tablet by mouth once a day   Patient Instructions: 1)  take glipizide pill in the morning. 2)  take metformin 1000mg  twice daily- every 12 hours. 3)  record blood sugars before meals.    ]  Orders Added: 1)  [facility] [83036] 2)  Lipid[facility] [[id number]] 3)  [place]- Est  Level 4 [id number]

## 2010-09-15 NOTE — Progress Notes (Signed)
  Patient requests sleep studay. Endorses periods of apnea, also noticed at previous hospitalization. Will send for split night sleep study. Briscoe Deutscher DO  October 26, 2009 4:19 PM

## 2010-09-15 NOTE — Progress Notes (Signed)
Summary: triage  Phone Note Call from Patient Call back at Home Phone [phone]   Caller: niece-[name] Summary of Call: dogs scratched her legs about 2 weeks ago and still is oozing and hurting and needs antibiotics Initial call taken by: Audie Clear,  October 08, 2008 11:54 AM  Follow-up for Phone Call        went to ED after this happened. finished antibiotics. still oozing & now is becoming more painful. appt today Follow-up by: Elige Radon RN,  October 08, 2008 11:55 AM

## 2010-09-15 NOTE — Consult Note (Signed)
Summary: [name] - Rheumatology  [name] - Rheumatology   Imported By: Audie Clear 06/04/2009 15:17:00  _____________________________________________________________________  External Attachment:    Type:   Image     Comment:   External Document

## 2010-09-15 NOTE — Progress Notes (Signed)
Summary: rx req  Phone Note Call from Patient Call back at Home Phone [phone]   Caller: niece-Theresa Summary of Call: pt was seen by Dr. Ronnald Ramp and thought Dr. Ronnald Ramp was going to call something in for her cough and congestion. [facility] [phone]. Initial call taken by: Raymond Gurney,  November 27, 2008 11:08 AM  Follow-up for Phone Call        spoke with niece and advised there is no mention of this in notes that MD has done at this time but will send message to MD to please advise. Follow-up by: Marcell Barlow RN,  November 27, 2008 11:17 AM  Additional Follow-up for Phone Call Additional follow up Details #1::        pls advise she can take OTC mucinex or robitussin for her cough.  Call back if not getting better. Additional Follow-up by: Dixon Boos MD,  November 27, 2008 12:39 PM    Additional Follow-up for Phone Call Additional follow up Details #2::    message given to niece. Follow-up by: Marcell Barlow RN,  November 27, 2008 2:00 PM

## 2010-09-15 NOTE — Assessment & Plan Note (Signed)
Summary: FU/KH   Vital Signs:  Patient profile:   60 year old female Height:      61 inches Weight:      215 pounds BMI:     40.77 Temp:     97.5 degrees F oral Pulse rate:   96 / minute BP sitting:   138 / 79  (left arm) Cuff size:   regular  Vitals Entered By: Schuyler Amor CMA (October 20, 2009 2:33 PM) CC: F/U bladder infection, diabetes, urine retention Is Patient Diabetic? Yes Pain Assessment Patient in pain? yes     Location: lower abdomen Intensity: 7   Primary Care Provider:  Briscoe Deutscher [id number]  CC:  F/U bladder infection, diabetes, and urine retention.  History of Present Illness: 60 year old WF, with multiple medical problems and polypharmacy:  1. Diabetes: uncontrolled, but improving. currently on Lantus 55 units daily and Novolog 15 units three times daily. pt also on Prednisone 5 mg for > 6 months for gout.    2. Dysuria: continued after treatment with Macrobid. Dx with e.coli UTI at last visit.  endorses suprabubic pressure, urinary frequency. Denies back pain, hematuria.   3. Urinary Retention: Rx Bethanechol. no PVR residual today.       Current Medications (verified): 1)  Advair Diskus 500-50 Mcg/dose Misc (Fluticasone-Salmeterol) .[name] Kitchen.. 1 Puff Inh Two Times A Day 2)  Allopurinol 300 Mg Tabs (Allopurinol) .... Take 1  Tablet By Mouth Twice A Day Per Dr Jolee Ewing 3)  Ascensia Elite Test  Strp (Glucose Blood) .... Test Blood Sugarfour Times Daily 4)  Doxepin Hcl 100 Mg Caps (Doxepin Hcl) .... Take 1 Capsule By Mouth At Bedtime 5)  Epipen 2-Pak 0.3 Mg/0.74ml (1:1000) Devi (Epinephrine Hcl (Anaphylaxis)) .... Inject 1 Pen Subcutaneously As Directed 6)  Lasix 80 Mg Tabs (Furosemide) .... Take 1 Tablet By Mouth Twice A Day 7)  Lexapro 10 Mg Tabs (Escitalopram Oxalate) .... Take 1 Tablet By Mouth Once A Day 8)  Metformin Hcl 500 Mg Tabs (Metformin Hcl) .... Take 1 Tablet By Mouth Twice A Day 9)  Potassium Chloride Crys Cr 20 Meq Tbcr (Potassium Chloride  Crys Cr) .... Take 4  Tabs By Mouth in The Am, 4 At Noon, and 4 At [facility] 10)  Ranitidine Hcl 300 Mg Caps (Ranitidine Hcl) .... Take 1 Capsule By Mouth Once A Day 11)  Zocor 10 Mg Tabs (Simvastatin) .... Take 1 Tablet By Mouth Once A Day 12)  Nexium 40 Mg  Cpdr (Esomeprazole Magnesium) .[name] Kitchen.. 1 Tablet By Mouth Twice Daily 13)  Calcium 500 500 Mg  Tabs (Calcium Carbonate) .[name] Kitchen.. 1 Tablet By Mouth Daily 14)  Methotrexate 2.5 Mg  Tabs (Methotrexate Sodium) .... Take As Directed By Dr. Charlestine Night 12.5 Mg (5 Pills) On Saturday and Sunday 15)  Lyrica 100 Mg  Caps (Pregabalin) .[name] Kitchen.. 1 Tab By Mouth Three Times A Day 16)  Lantus 100 Unit/ml Soln (Insulin Glargine) .... 30 Units in The Am and 30 Units in The Pm. Dispense One Month Supply. 17)  Warfarin Sodium 5 Mg Tabs (Warfarin Sodium) .... Take As Directed 18)  Folic Acid 1 Mg Tabs (Folic Acid) .[name] Kitchen.. 1 Tablet By Mouth Daily While On Methotrexate 19)  Clonidine Hcl 0.2 Mg Tabs (Clonidine Hcl) .... 2 By Mouth At Bedtime For Depression 20)  Doxepin Hcl 25 Mg Caps (Doxepin Hcl) .... Take 1 Capsule By Mouth Once A Day At Bedtime 21)  Iron 325 (65 Fe) Mg Tabs (Ferrous Sulfate) .... One By Mouth  Three Times A Day 22)  Novolog 100 Unit/ml Soln (Insulin Aspart) .[name] Kitchen.. 15 Units Subcutaneous Injection  Prior To Breakfast and Lunch and Eveing Meal.  Disp: 1 Month Supply. 23)  Vitamin D 1000 Unit Tabs (Cholecalciferol) .... Take 1 Tabs Daily 24)  Bd Insulin Syringe Ultrafine 30g X 1/2" 1 Ml Misc (Insulin Syringe-Needle U-100) .... Dispense Supply For Four Times Per Day Dosing of Insulin. 25)  Hydrocodone-Acetaminophen 5-325 Mg Tabs (Hydrocodone-Acetaminophen) .[name] Kitchen.. 1 By Mouth Up To 4 Times Per Day As Needed For Pain 26)  Ventolin Hfa 108 (90 Base) Mcg/act Aers (Albuterol Sulfate) .... Take One Puff Q4 Hours. 27)  Spironolactone 100 Mg Tabs (Spironolactone) .... Once Daily in Am 28)  Prednisone 5 Mg Tabs (Prednisone) .... Once Daily 29)  Bethanechol Chloride 5 Mg Tabs (Bethanechol  Chloride) .... One By Mouth Two Times A Day 30)  Diflucan 100 Mg Tabs (Fluconazole) .... One By Mouth X 1 31)  Cipro 250 Mg Tabs (Ciprofloxacin Hcl) .[name] Kitchen.. 1 By Mouth 2 Times Daily 32)  Nystatin 100000 Unit/gm Powd (Nystatin) .... Apply To Afected Area Two Times A Day As Needed Rash. For Groin and Pannus.  Allergies (verified): 1)  ! Codeine 2)  ! Penicillin 3)  ! Sulfa 4)  ! * Lithium 5)  ! Valium 6)  ! * Cogentin 7)  ! Lisinopril (Lisinopril) 8)  ! * Hyddal 9)  ! Diphenhydramine Hcl (Diphenhydramine Hcl) 10)  Thioridazine Hcl (Thioridazine Hcl) PMH-FH-SH reviewed for relevance  Review of Systems General:  Denies chills and fever. GU:  Complains of dysuria and urinary frequency; denies abnormal vaginal bleeding, discharge, incontinence, and urinary hesitancy. Derm:  Complains of rash. Endo:  Denies excessive hunger, excessive thirst, and excessive urination.  Physical Exam  General:  Well-developed, well-nourished, in no acute distress; alert, appropriate and cooperative throughout examination. Vitals reviewed. Lungs:  CTAB Heart:  RRR no m/r/g Abdomen:  soft, + BS, suprapubic tenderness. Genitalia:  Vulvar candida, irritated. Skin:  Intertrigo - pannus, groin.  Diabetes Management Exam:    Foot Exam (with socks and/or shoes not present):       Sensory-Pinprick/Light touch:          Left medial foot (L-4): normal          Left dorsal foot (L-5): normal          Left lateral foot (S-1): normal          Right medial foot (L-4): normal          Right dorsal foot (L-5): normal          Right lateral foot (S-1): normal       Sensory-Monofilament:          Left foot: normal          Right foot: normal       Inspection:          Left foot: normal          Right foot: normal       Nails:          Left foot: normal          Right foot: normal    Impression & Recommendations:  Problem # 1:  DIABETES MELLITUS, TYPE I, UNCONTROLLED (ICD-250.03) Assessment  Improved  Adjusted regimen. Her updated medication list for this problem includes:    Metformin Hcl 500 Mg Tabs (Metformin hcl) .[name] Kitchen... Take 1 tablet by mouth twice a day    Lantus 100 Unit/ml Soln (  Insulin glargine) .[name] KitchenMarland KitchenMarland KitchenMarland Kitchen 30 units in the am and 30 units in the pm. dispense one month supply.    Novolog 100 Unit/ml Soln (Insulin aspart) .[name] KitchenMarland KitchenMarland KitchenMarland Kitchen 15 units subcutaneous injection  prior to breakfast and lunch and eveing meal.  disp: 1 month supply.  Orders: [place]- Est  Level 4 [id number])  Problem # 2:  DYSURIA (ICD-788.1) Assessment: Unchanged  The following medications were removed from the medication list:    Macrobid 100 Mg Caps (Nitrofurantoin monohyd macro) ..... One by mouth two times a day x 7 days Her updated medication list for this problem includes:    Bethanechol Chloride 5 Mg Tabs (Bethanechol chloride) ..... One by mouth two times a day    Cipro 250 Mg Tabs (Ciprofloxacin hcl) .[name] Kitchen... 1 by mouth 2 times daily  Orders: Urinalysis-[facility] (00000) [place]- Est  Level 4 [id number])  Problem # 3:  UTI (ICD-599.0) Assessment: Unchanged [place] with Cipro. UA negative today, but patient with s/s. Note: UA negative at last visit, but grew > 100,000 e.coli. Follow up on Friday for INR check. The following medications were removed from the medication list:    Macrobid 100 Mg Caps (Nitrofurantoin monohyd macro) ..... One by mouth two times a day x 7 days Her updated medication list for this problem includes:    Bethanechol Chloride 5 Mg Tabs (Bethanechol chloride) ..... One by mouth two times a day    Cipro 250 Mg Tabs (Ciprofloxacin hcl) .[name] Kitchen... 1 by mouth 2 times daily  Orders: [place]- Est  Level 4 (99214)Future Orders: INR/PT-[facility] [id number]) ... 10/24/2009  Problem # 4:  URINARY RETENTION (ICD-788.20) Assessment: Improved  Orders: [place]- Est  Level 4 [id number])  Problem # 5:  INTERTRIGO, CANDIDAL [id number]) Assessment: New  Rx Diflucan x 1 for vaginal candida and Nystatin Powder as  needed.  Orders: [place]- Est  Level 4 (99214)  Complete Medication List: 1)  Advair Diskus 500-50 Mcg/dose Misc (Fluticasone-salmeterol) .[name] Kitchen.. 1 puff inh two times a day 2)  Allopurinol 300 Mg Tabs (Allopurinol) .... Take 1  tablet by mouth twice a day per dr trueslow 3)  Ascensia Elite Test Strp (Glucose blood) .... Test blood sugarfour times daily 4)  Doxepin Hcl 100 Mg Caps (Doxepin hcl) .... Take 1 capsule by mouth at bedtime 5)  Epipen 2-pak 0.3 Mg/0.63ml (1:1000) Devi (Epinephrine hcl (anaphylaxis)) .... Inject 1 pen subcutaneously as directed 6)  Lasix 80 Mg Tabs (Furosemide) .... Take 1 tablet by mouth twice a day 7)  Lexapro 10 Mg Tabs (Escitalopram oxalate) .... Take 1 tablet by mouth once a day 8)  Metformin Hcl 500 Mg Tabs (Metformin hcl) .... Take 1 tablet by mouth twice a day 9)  Potassium Chloride Crys Cr 20 Meq Tbcr (Potassium chloride crys cr) .... Take 4  tabs by mouth in the am, 4 at noon, and 4 at hs 10)  Ranitidine Hcl 300 Mg Caps (Ranitidine hcl) .... Take 1 capsule by mouth once a day 11)  Zocor 10 Mg Tabs (Simvastatin) .... Take 1 tablet by mouth once a day 12)  Nexium 40 Mg Cpdr (Esomeprazole magnesium) .[name] Kitchen.. 1 tablet by mouth twice daily 13)  Calcium 500 500 Mg Tabs (Calcium carbonate) .[name] Kitchen.. 1 tablet by mouth daily 14)  Methotrexate 2.5 Mg Tabs (Methotrexate sodium) .... Take as directed by dr. Charlestine Night 12.5 mg (5 pills) on saturday and sunday 15)  Lyrica 100 Mg Caps (Pregabalin) .[name] Kitchen.. 1 tab by mouth three times a day 16)  Lantus 100 Unit/ml Soln (Insulin glargine) .[name] KitchenMarland KitchenMarland Kitchen  30 units in the am and 30 units in the pm. dispense one month supply. 17)  Warfarin Sodium 5 Mg Tabs (Warfarin sodium) .... Take as directed 18)  Folic Acid 1 Mg Tabs (Folic acid) .[name] Kitchen.. 1 tablet by mouth daily while on methotrexate 19)  Clonidine Hcl 0.2 Mg Tabs (Clonidine hcl) .... 2 by mouth at bedtime for depression 20)  Doxepin Hcl 25 Mg Caps (Doxepin hcl) .... Take 1 capsule by mouth once a day at  bedtime 21)  Iron 325 (65 Fe) Mg Tabs (Ferrous sulfate) .... One by mouth three times a day 22)  Novolog 100 Unit/ml Soln (Insulin aspart) .[name] Kitchen.. 15 units subcutaneous injection  prior to breakfast and lunch and eveing meal.  disp: 1 month supply. 23)  Vitamin D 1000 Unit Tabs (Cholecalciferol) .... Take 1 tabs daily 24)  Bd Insulin Syringe Ultrafine 30g X 1/2" 1 Ml Misc (Insulin syringe-needle u-100) .... Dispense supply for four times per day dosing of insulin. 25)  Hydrocodone-acetaminophen 5-325 Mg Tabs (Hydrocodone-acetaminophen) .[name] Kitchen.. 1 by mouth up to 4 times per day as needed for pain 26)  Ventolin Hfa 108 (90 Base) Mcg/act Aers (Albuterol sulfate) .... Take one puff q4 hours. 27)  Spironolactone 100 Mg Tabs (Spironolactone) .... Once daily in am 28)  Prednisone 5 Mg Tabs (Prednisone) .... Once daily 29)  Bethanechol Chloride 5 Mg Tabs (Bethanechol chloride) .... One by mouth two times a day 30)  Diflucan 100 Mg Tabs (Fluconazole) .... One by mouth x 1 31)  Cipro 250 Mg Tabs (Ciprofloxacin hcl) .[name] Kitchen.. 1 by mouth 2 times daily 32)  Nystatin 100000 Unit/gm Powd (Nystatin) .... Apply to afected area two times a day as needed rash. for groin and pannus.  Patient Instructions: 1)  It was nice to see you today! 2)  I have increased your Lantus to 30 units in the morning and 30 units at night. 3)  You have a UTI. I am prescribing Cipro for this. 4)  You have a yeast infection. I am prescribing Diflucan and Nystatin for this. Prescriptions: NYSTATIN 100000 UNIT/GM POWD (NYSTATIN) apply to afected area two times a day as needed rash. for groin and pannus.  #1 x 0   Entered and Authorized by:   Briscoe Deutscher DO   Signed by:   Briscoe Deutscher DO on 10/20/2009   Method used:   Electronically to        [facility] [phone]* (retail)       [place], [place]  [address]       Ph: [phone]       Fax: [phone]   RxID:   [id number] LANTUS 100 UNIT/ML SOLN (INSULIN  GLARGINE) 30 units in the am and 30 units in the pm. Dispense one month supply.  #1 x 3   Entered and Authorized by:   Briscoe Deutscher DO   Signed by:   Briscoe Deutscher DO on 10/20/2009   Method used:   Electronically to        [facility] [phone]* (retail)       [place], [place]  [address]       Ph: [phone]       Fax: [phone]   RxID:   [id number] CIPRO 250 MG TABS (CIPROFLOXACIN HCL) 1 by mouth 2 times daily  #10 x 0   Entered and Authorized by:   Briscoe Deutscher  DO   Signed by:   Briscoe Deutscher DO on 10/20/2009   Method used:   Electronically to        [facility] [phone]* (retail)       [place], [place]  [address]       Ph: [phone]       Fax: [phone]   RxID:   [id number] DIFLUCAN 100 MG TABS (FLUCONAZOLE) one by mouth x 1  #1 x 0   Entered and Authorized by:   Briscoe Deutscher DO   Signed by:   Briscoe Deutscher DO on 10/20/2009   Method used:   Electronically to        [facility] [phone]* (retail)       [place],   [address]       Ph: [phone]       Fax: [phone]   RxID:   [id number]   Laboratory Results   Urine Tests  Date/Time Received: October 20, 2009 2:56 PM  Date/Time Reported: October 20, 2009 3:07 PM   Routine Urinalysis   Color: light yellow Appearance: Clear Glucose: 250   (Normal Range: Negative) Bilirubin: negative   (Normal Range: Negative) Ketone: negative   (Normal Range: Negative) Spec. Gravity: 1.010   (Normal Range: 1.003-1.035) Blood: negative   (Normal Range: Negative) pH: 7.0   (Normal Range: 5.0-8.0) Protein: negative   (Normal Range: Negative) Urobilinogen: 0.2   (Normal Range: 0-1) Nitrite: negative   (Normal Range: Negative) Leukocyte Esterace: negative   (Normal Range: Negative)    Comments: ...............test performed by......[name] KitchenBonnie A. Martinique, MLS (ASCP)cm

## 2010-09-15 NOTE — Progress Notes (Signed)
----   Converted from flag ---- ---- 03/12/2009 1:36 PM, Briscoe Deutscher MD wrote: please call [name] and let her know that she has a fracture of her little toe, but it needs no treatment. let me know if the pain worsens. ------------------------------       Additional Follow-up for Phone Call Additional follow up Details #2::    Pt notified. Follow-up by: Janeth Rase LPN,  July 29, 624THL [id number] AM  Called and left message to call us back.  Janeth Rase LPN  July 28, 624THL 624THL PM

## 2010-09-15 NOTE — Progress Notes (Signed)
Summary: update on pt   Phone Note Call from Patient Call back at [phone]   Caller: teresa/neice Reason for Call: Talk to Nurse Summary of Call: pts neice is calling to let Dr. Ronnald Ramp know pt is in the hospital in room # 3707, she doesn't think pt is on her doxepin and clonidine and is afraid she may have another spell. You may reach her at the above number Initial call taken by: ERIN LEVAN,  May 17, 2007 4:38 PM  Follow-up for Phone Call        I saw Dillon in the hospital last night.  I have been following along with team.  Doxepin was restarted by primary team yesterday.  Please call pt's neice and tell her this as I am post-call and at home today. thanks Follow-up by: Dixon Boos MD,  May 18, 2007 11:16 AM  Additional Follow-up for Phone Call Additional follow up Details #1::        spoke with niece and relayed message. Additional Follow-up by: Marcell Barlow RN,  May 19, 2007 11:11 AM

## 2010-09-15 NOTE — Progress Notes (Signed)
Summary: roi/ts  ---- Converted from flag ---- ---- 05/31/2010 5:10 PM, Briscoe Deutscher DO wrote: notes have still not been faxed to [place]. please follow up on this. thanks! ------------------------------  called theresa and lmvm, we did not receive records from urologist. left our fax #

## 2010-09-15 NOTE — Assessment & Plan Note (Signed)
Summary: fu wp   Vital Signs:  Patient Profile:   60 Years Old Female Height:     61 inches Weight:      194.7 pounds Pulse rate:   100 / minute BP sitting:   114 / 67  (right arm)  Pt. in pain?   no  Vitals Entered By: [name] CMA, (June 30, 2007 3:22 PM)                  PCP:  Dixon Boos MD  Chief Complaint:  f/up DM.[name] Kitchen  History of Present Illness: 1.  Ms Leonhart's main problem is polypharmacy.  She had a recent admission to [facility].  Apparentally following her recent knee replacment, she took some narcotic pain meds at home and developed AMS.  She was admitted to the hospital and then started having hallucinations after her home psych meds (doxepin 125mg  at bedtime) was held.  She was transferred to [facility] for 1 day after being declared medically stable.  Upon d/c from the hospital, many of her med doses were altered/stopped.  Her HCTZ, lasix, and spironolactone were all stopped, colchicine was stopped, and doxepin was decreased to 50mg  at bedtime.  Ms Arhcer, however, has resumed all of her diuretics at prior doses b/c swelling recurred in her legs.  She also resumed doxepin 150mg  at bedtime b/c she was unable to sleep on 50mg  and apparentally was wandering around all night.  She reports that she had 1 outpatient psych f/u visit and says the psychiatrist said to continue doxepin 125mg  at bedtime and also said she does not need further outpatient psych follow-up.  2.  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  Left knee scheduled 07/10/07.  Dr. Charlestine Night has stopped her prednisone since she has done so well after the surgery.  Continues on MTX 5mg  every Saturday and Sunday.  3.  DM2- she says sugars are "perfect" after stopping prednisone.  does not have CBG log book with her today.  Also has lost some weight after stopping prednisone.  Denies any low blood sugars or symptoms of hypoglycemia.  4.  Also complains of 4 week h/o right breast lump  that is growing.  No personal or family h/o breast cancer.  Overdue for screening mammogram- last was 10/06 and was normal.    Past Surgical History:    Appendectomy - 08/28/2001    bladder stretching - 08/28/2001    Cholecystectomy - 08/28/2001    R knee replacement- Sept 2008 (Dr. Alvan Dame)              Physical Exam  General:     NAD Breasts:     skin/areolae normal, no abnormal thickening, no nipple discharge, and no tenderness.  0.5cm nodule palpated over right chest wall, feels like it is in subcutaneous tissue under skin as opposed to breast tissue. Lungs:     CTAB, nl effort, no wheezes Heart:     tachycardic, regular rhythm, no murmur Extremities:     2-3+ b/l edema to ankles b/l Psych:     Oriented X3, memory intact for recent and remote, normally interactive, good eye contact, not anxious appearing, and not depressed appearing.      Impression & Recommendations:  Problem # 1:  BREAST MASS, RIGHT (ICD-611.72) Pt given phone number to make appt for diagnostic mammogram.  Reassured her that mass feels like part of skin, not part of breast tissue, but still important to have mammogram completed. Orders:  Mammogram (Diagnostic) (Mammo)   Problem # 2:  SCHIZOPHRENIA (ICD-295.90) pt stable back on doxepin 125mg  at bedtime.  Would love for her to be able to decrease/stop this med, but she is convinced that it controls her "schizophrenia" and can't sleep without it.  Problem # 3:  OBESITY, NOS (ICD-278.00) documented 26 lb weight loss since 4/08, suspect due to increased mobility after knee replacement and stopping steroids  Problem # 4:  HYPOKALEMIA (ICD-276.8) check bmet today. Orders: Basic Met-[facility] [id number])   Problem # 5:  HYPERCHOLESTEROLEMIA (ICD-272.0) advised to come back for fasting labs since we haven't checked cholesterol since starting statin about 6 months ago. Her updated medication list for this problem includes:    Zocor 10 Mg Tabs (Simvastatin)  .[name] Kitchen... Take 1 tablet by mouth once a day   Problem # 6:  DIABETES MELLITUS II, UNCOMPLICATED ([id number]) A1C improved after stopping prednisone (8-> 6.2).  Warned her of low blood sugars as she loses weight and weans from steroids.  May have to stop glipizide of hypoglycemia occurs.  The following medications were removed from the medication list:    Lisinopril 20 Mg Tabs (Lisinopril) .[name] Kitchen... Take 1 tablet by mouth once a day  Her updated medication list for this problem includes:    Diovan 40 Mg Tabs (Valsartan) .[name] Kitchen... Take 1 tablet by mouth once a day    Glipizide 10 Mg Tb24 (Glipizide) .[name] Kitchen... Take 1 tablet by mouth once a day    Metformin Hcl 500 Mg Tabs (Metformin hcl) .[name] Kitchen... Take 1 tablet by mouth twice a day    Tgt Aspirin 81 Mg Tbec (Aspirin) .[name] Kitchen... Take 1 tablet by mouth once a day  Orders: A1C-[facility] [id number])   Complete Medication List: 1)  Advair Diskus 250-50 Mcg/dose Misc (Fluticasone-salmeterol) .... Inhale 1 puff as directed twice a day 2)  Albuterol 90 Mcg/act Aers (Albuterol) .... Inhale 1 puff using inhaler four times a day 3)  Allopurinol 300 Mg Tabs (Allopurinol) .... Take 1 tablet by mouth twice a day 4)  Amitriptyline Hcl 25 Mg Tabs (Amitriptyline hcl) .... Take 1 tablet by mouth every night 5)  Ascensia Elite Test Strp (Glucose blood) .[name] Kitchen.. 1 strip once a day 6)  Clonidine Hcl 0.2 Mg Tabs (Clonidine hcl) .... Take 2 tablet by mouth every night 7)  Colchicine 0.6 Mg Tabs (Colchicine) .... Take 1 tablet by mouth twice a day 8)  Darvocet-n 100 100-650 Mg Tabs (Propoxyphene n-apap) .... Take 1 tablet by mouth every six hours 9)  Diovan 40 Mg Tabs (Valsartan) .... Take 1 tablet by mouth once a day 10)  Doxepin Hcl 100 Mg Caps (Doxepin hcl) .... Take 1 capsule by mouth at bedtime 11)  Epipen 2-pak 0.3 Mg/0.46ml (1:1000) Devi (Epinephrine hcl (anaphylaxis)) .... Inject 1 pen subcutaneously as directed 12)  Glipizide 10 Mg Tb24 (Glipizide) .... Take 1 tablet by mouth once a day 13)   Hydrochlorothiazide 25 Mg Tabs (Hydrochlorothiazide) .... Take 1 tablet by mouth twice a day 14)  Lasix 80 Mg Tabs (Furosemide) .... Take 1 tablet by mouth twice a day 15)  Lexapro 10 Mg Tabs (Escitalopram oxalate) .... Take 1 tablet by mouth once a day 16)  Metformin Hcl 500 Mg Tabs (Metformin hcl) .... Take 1 tablet by mouth twice a day 17)  Potassium Chloride Crys Cr 20 Meq Tbcr (Potassium chloride crys cr) .... Take 3 tablet by mouth twice times a day and 2 tablets once daily 18)  Prempro 0.3-1.5 Mg Tabs (Conj estrog-medroxyprogest ace) .[name] KitchenMarland KitchenMarland Kitchen  Take 1 tablet by mouth once a day 19)  Ranitidine Hcl 300 Mg Caps (Ranitidine hcl) .... Take 1 capsule by mouth once a day 20)  Spironolactone 50 Mg Tabs (Spironolactone) .... Take 1 tablet by mouth once a day 21)  Tgt Aspirin 81 Mg Tbec (Aspirin) .... Take 1 tablet by mouth once a day 22)  Zocor 10 Mg Tabs (Simvastatin) .... Take 1 tablet by mouth once a day 23)  Nexium 40 Mg Cpdr (Esomeprazole magnesium) .[name] Kitchen.. 1 tablet by mouth twice daily 24)  Calcium 500 500 Mg Tabs (Calcium carbonate) .[name] Kitchen.. 1 tablet by mouth tid 25)  Methotrexate 2.5 Mg Tabs (Methotrexate sodium) .... Take as directed by dr. Charlestine Night 26)  Doxepin Hcl 25 Mg Caps (Doxepin hcl) .[name] Kitchen.. 1 tablet by mouth qhs     ] Laboratory Results   Blood Tests   Date/Time Recieved: June 30, 2007 3:31 PM  Date/Time Reported: June 30, 2007 4:21 PM   HGBA1C: 6.2%   (Normal Range: Non-Diabetic - 3-6%   Control Diabetic - 6-8%)  CBC Comments: ............TEST PERFORMED BY DELORES PATE-GADDY,CMA (AAMA)   Appended Document: Orders Update    Clinical Lists Changes  Orders: Added new Test order of [facility]- Est  Level 4 [id number]) - Signed

## 2010-09-15 NOTE — Initial Assessments (Signed)
Summary: Dyspnea  ** INSERT INTO PROGRESS NOTES **  PCP:  Dixon Boos, MD, at [facility]  CC:  Shortness of breath  HPI:  60 yo female with extensive past medical history who presented to PCP this morning with shortness of breath, which has been progressively worse all week, and became markedly worse last night.  Given 1 albuterol neb in clinic and sent to ED after no improvement.  Given continuous nebs in ED, but without improvement.  Denies cough, fever, chest pain, nausea, vomiting, diarrhea, abdominal pain, lower extremity swelling.  Patient was hospitalized earlier this month for pulmonary embolism and is currently on warfarin with an INR of 2.6.  Most recent echocardiogram from November 18, 2008, shows an ejection fraction of 55-60%.  ROS:  Per HPI.  PMH: Schizophrenia DM, Type 2, with peripheral neuropathy HTN HLD Hypokalemia Gout Depression Asthma Chronic Venous Insufficiency Allergic Rhinitis GERD Osteoarthritis Obesity Urge Incontinence Barretts Esophagus, EGD 03/2006, to be repeated in 2-3 years Nephrolithiasis 09/2001 PFT's 10/13/2006:  Mild obstructive airway disase Amenorrhea since 60 yo CVA at age 72 (??)  PSH: Appendectomy, 08/28/2001 Cholecystectomy, 08/28/2001 Bladder Stretching, 08/28/2001 Bilateral knee replacement, 12/06/06  FH:  Father deceased, black lung disease.  Mother with bone cancer.  Brother with lung cancer.  Brother with blood cancer.  Sister deceased from colon cancer.  Strong FH of DM, CAD, kidney disease.  SH:  Divorced.  No children.  Disabled secondary to schizophrenia.  Niece, Helene Kelp, helps care for her.  No alcohol or illicit drug use.  Stopped smoking in December 06, 1971.  ALL: Valium Thorazine Librium Haldol Penicillin Erytromycin Sulfa Drugs ASA Advil Dilantin Codeine  MED: Advair Diskus 500-50 1 puff inhaled two times a day Albuterol 90 micrograms 1 puff inhaled qid Allopurinol 300 mg by mouth two times a  day Clonidine 0.1 mg by mouth two times a day Colchicine 0.6 mg by mouth two times a day Darvocet 100-650 1 tab by mouth q6h as needed for pain Valsartan 40 mg by mouth daily Doxepin 125 mg by mouth at bedtime Glipizide 10 mg by mouth daily HCTZ 25 mg by mouth two times a day Furosemide 80 mg by mouth daily Lexapro 10 mg by mouth daily Metformin 500 mg by mouth two times a day KCl 60 mg by mouth two times a day, and 40 mEq by mouth daily Ranitidine 300 mg by mouth daily Spironolactone 50 mg by mouth daily Simvastatin 10 mg by mouth daily Nexium 40 mg by mouth two times a day Calcium Supplement 1 tab by mouth three times a day Methotrexate 2.5 mg as directed by Dr. Charlestine Night Oxybutynin 5 mg by mouth two times a day Lyrica 100 mg by mouth three times a day Lantus 30 units subq at bedtime Warfarin as directed for pulmonary embolism, INR goal 2-3. Folic Acid 1 mg by mouth daily  PE:  98.5    98-115    20-32    101-117/65-70    93-95% (2L O2 [place])   Gen:  Alert and oriented, obese, uncomfortable appearing   HEENT:  Dry mucous membranes   Cardio:  Regular rate and rhythm without murmurs, rubs, or gallops.   Pulm:  Diffuse expiratory wheeze, bibasilar crackles (L>R), increased work of breathing.   Abd:  Soft, nontender, obese.   Ext:  Compression stockings bilateral lower extremities, nontender.   Skin:  RIGHT lower extremity with small serosanguinous stain on compression stocking at infection site   Psych:  No apparent delusions  or hallucinations.  Appropriate affect.  Anxious.  LABS/STUDIES: BMet:  136 / 4.0 / 100 / 26 / 35 / 1.29 / 352, Ca 9.3 CBC:  13.1 / 10.5 / 30.4 / 255                INR  2.6 pCXR:  Very low lung volumes.  Cardiomegaly, vascular congestion. ABG:  pH 7.444, pCO2 32.8, pO2 66, Bicarb 22.5, O2 Sat 25%  A/P:  60 year old female with respiratory distress. Respiratory Distress.  Appreciate CCM consultation.  DDx includes (in order of likelihood):  asthma exacerbation,  CHF (which would be new), and pneumonia.  ABG shows mild respiratory alkalosis.  Minimal response from nebulized treatment so far.  Patient is currently on BiPAP and will be admitted to the MICU.  For Asthma:  continue Albuterol/Atrovent nebs, Solumedrol.  For CHF, will diurese with Furosemide 80 mg IV two times a day, check BNP, cycle cardiac enzymes.  May need to repeat echo this admission.  Will cover with Avelox for possible community acquired pneumonia, though less likely at this time. Diabetes Mellitus, Type II.  Hold home Glipizide and Metformin.  Takes Lantus 30 units at bedtime at home, will reduce to 15 units at bedtime while NPO and cover with sensitive SSI q4h. Hypokalemia.  Chronic problem.  K = 4.0 in ED.  Continue home supplementation.  Follow daily BMet. Cellulitis, RIGHT Lower Leg.  Well-healed compared to previous admission.  No antibiotics.  Follow clinically. HTN.  Stable at this point.  Continue home Clonidine and Spironolactone.  Hold home doses of Valsartan and HCTZ at this time. HLD.  Last LDL 96 (02/2008).  Continue home Simvastatin. Gout. Continue home Allopurinol and Colchicine. Schizophrenia/Depression.  Stable at this time.  No antipsychotic medications on home medication list.  Continue home Amitriptyline, Doxepin and Lexapro. GERD with Barrett's Esophagus.  Pantoprazole 40 mg by mouth two times a day. Osteoarthritis.  Continue home Darvocet. Rheumatoid Arthritis.  Methotrexate is dosed once weekly. Peripheral Neuropathy.  Continue home Lyrica. Bladder Spasm.  Continue home Oxybutynin. FEN/GI.  NPO for now.  Heplock IVF as fluid overload is a possible contributing factor. Prophylaxis.  Patient is anticoagulated. Disposition.  Pending stabilization.  Once stable will return to home, where she is cared for by her niece and mother.

## 2010-09-15 NOTE — Assessment & Plan Note (Signed)
Summary: f/u eo   Vital Signs:  Patient profile:   60 year old female Height:      61 inches Weight:      219.9 pounds BMI:     41.70 Temp:     97.7 degrees F oral Pulse rate:   93 / minute BP sitting:   122 / 78  (left arm) Cuff size:   large  Vitals Entered By: Isabelle Course (October 14, 2009 2:53 PM) CC: F/U Is Patient Diabetic? Yes Did you bring your meter with you today? No Pain Assessment Patient in pain? no      Comments Finished antibotic and the burning with urination returned, still can not empty bladder,   Primary Care Provider:  Briscoe Deutscher [id number]  CC:  F/U.  History of Present Illness: 60 year old WF, with multiple medical problems and polypharmacy:  1. Dysuria:endorses suprabubic pressure, urinary frequency. Denies back pain, hematuria.   2. Urinary Retention: PVR > 900 cc today.        Habits & Providers  Alcohol-Tobacco-Diet     Tobacco Status: never  Current Medications (verified): 1)  Advair Diskus 500-50 Mcg/dose Misc (Fluticasone-Salmeterol) .[name] Kitchen.. 1 Puff Inh Two Times A Day 2)  Allopurinol 300 Mg Tabs (Allopurinol) .... Take 1  Tablet By Mouth Twice A Day Per Dr Jolee Ewing 3)  Ascensia Elite Test  Strp (Glucose Blood) .... Test Blood Sugarfour Times Daily 4)  Doxepin Hcl 100 Mg Caps (Doxepin Hcl) .... Take 1 Capsule By Mouth At Bedtime 5)  Epipen 2-Pak 0.3 Mg/0.48ml (1:1000) Devi (Epinephrine Hcl (Anaphylaxis)) .... Inject 1 Pen Subcutaneously As Directed 6)  Lasix 80 Mg Tabs (Furosemide) .... Take 1 Tablet By Mouth Twice A Day 7)  Lexapro 10 Mg Tabs (Escitalopram Oxalate) .... Take 1 Tablet By Mouth Once A Day 8)  Metformin Hcl 500 Mg Tabs (Metformin Hcl) .... Take 1 Tablet By Mouth Twice A Day 9)  Potassium Chloride Crys Cr 20 Meq Tbcr (Potassium Chloride Crys Cr) .... Take 4  Tabs By Mouth in The Am, 4 At Noon, and 4 At [facility] 10)  Ranitidine Hcl 300 Mg Caps (Ranitidine Hcl) .... Take 1 Capsule By Mouth Once A Day 11)  Zocor 10 Mg Tabs  (Simvastatin) .... Take 1 Tablet By Mouth Once A Day 12)  Nexium 40 Mg  Cpdr (Esomeprazole Magnesium) .[name] Kitchen.. 1 Tablet By Mouth Twice Daily 13)  Calcium 500 500 Mg  Tabs (Calcium Carbonate) .[name] Kitchen.. 1 Tablet By Mouth Daily 14)  Methotrexate 2.5 Mg  Tabs (Methotrexate Sodium) .... Take As Directed By Dr. Charlestine Night 12.5 Mg (5 Pills) On Saturday and Sunday 15)  Lyrica 100 Mg  Caps (Pregabalin) .[name] Kitchen.. 1 Tab By Mouth Three Times A Day 16)  Lantus 100 Unit/ml Soln (Insulin Glargine) .... 50 Units Daily At Bedtime. Dispense One Month Supply. 17)  Warfarin Sodium 5 Mg Tabs (Warfarin Sodium) .... Take As Directed 18)  Folic Acid 1 Mg Tabs (Folic Acid) .[name] Kitchen.. 1 Tablet By Mouth Daily While On Methotrexate 19)  Clonidine Hcl 0.2 Mg Tabs (Clonidine Hcl) .... 2 By Mouth At Bedtime For Depression 20)  Doxepin Hcl 25 Mg Caps (Doxepin Hcl) .... Take 1 Capsule By Mouth Once A Day At Bedtime 21)  Iron 325 (65 Fe) Mg Tabs (Ferrous Sulfate) .... One By Mouth Three Times A Day 22)  Novolog 100 Unit/ml Soln (Insulin Aspart) .[name] Kitchen.. 15 Units Subcutaneous Injection  Prior To Breakfast and Lunch and Eveing Meal.  Disp: 1 Month Supply. 23)  Vitamin D 1000 Unit Tabs (Cholecalciferol) .... Take 1 Tabs Daily 24)  Bd Insulin Syringe Ultrafine 30g X 1/2" 1 Ml Misc (Insulin Syringe-Needle U-100) .... Dispense Supply For Four Times Per Day Dosing of Insulin. 25)  Hydrocodone-Acetaminophen 5-325 Mg Tabs (Hydrocodone-Acetaminophen) .[name] Kitchen.. 1 By Mouth Up To 4 Times Per Day As Needed For Pain 26)  Ventolin Hfa 108 (90 Base) Mcg/act Aers (Albuterol Sulfate) .... Take One Puff Q4 Hours. 27)  Spironolactone 100 Mg Tabs (Spironolactone) .... Once Daily in Am 28)  Prednisone 5 Mg Tabs (Prednisone) .... Once Daily 29)  Macrobid 100 Mg Caps (Nitrofurantoin Monohyd Macro) .... One By Mouth Two Times A Day X 7 Days 30)  Bethanechol Chloride 5 Mg Tabs (Bethanechol Chloride) .... One By Mouth Two Times A Day  Allergies (verified): 1)  ! Codeine 2)  !  Penicillin 3)  ! Sulfa 4)  ! * Lithium 5)  ! Valium 6)  ! * Cogentin 7)  ! Lisinopril (Lisinopril) 8)  ! * Hyddal 9)  ! Diphenhydramine Hcl (Diphenhydramine Hcl) 10)  Thioridazine Hcl (Thioridazine Hcl) PMH-FH-SH reviewed for relevance  Social History: Smoking Status:  never  Review of Systems General:  Denies chills and fever. GU:  Complains of dysuria, urinary frequency, and urinary hesitancy; denies abnormal vaginal bleeding, discharge, genital sores, hematuria, and incontinence. Derm:  Denies rash. Neuro:  Denies numbness and tingling. Endo:  Denies excessive hunger, excessive thirst, and excessive urination.  Physical Exam  General:  Well-developed, well-nourished, in no acute distress; alert, appropriate and cooperative throughout examination. Vitals reviewed. Abdomen:  soft, + BS, suprapubic tenderness.   Impression & Recommendations:  Problem # 1:  URINARY RETENTION (ICD-788.20) Assessment Unchanged Rx Bethanechol. Orders: [place]- Est  Level 4 [id number]) Urology Referral (Urology)  Problem # 2:  DYSURIA (ICD-788.1) Will treat today with Macrobid as s/s c/w UTI in patient with known urinary retention.   Orders: Urinalysis-[facility] (00000) Urine Culture-[facility] [id number]) [place]- Est  Level 4 [id number])  Complete Medication List: 1)  Advair Diskus 500-50 Mcg/dose Misc (Fluticasone-salmeterol) .[name] Kitchen.. 1 puff inh two times a day 2)  Allopurinol 300 Mg Tabs (Allopurinol) .... Take 1  tablet by mouth twice a day per dr trueslow 3)  Ascensia Elite Test Strp (Glucose blood) .... Test blood sugarfour times daily 4)  Doxepin Hcl 100 Mg Caps (Doxepin hcl) .... Take 1 capsule by mouth at bedtime 5)  Epipen 2-pak 0.3 Mg/0.61ml (1:1000) Devi (Epinephrine hcl (anaphylaxis)) .... Inject 1 pen subcutaneously as directed 6)  Lasix 80 Mg Tabs (Furosemide) .... Take 1 tablet by mouth twice a day 7)  Lexapro 10 Mg Tabs (Escitalopram oxalate) .... Take 1 tablet by mouth once a day 8)  Metformin Hcl  500 Mg Tabs (Metformin hcl) .... Take 1 tablet by mouth twice a day 9)  Potassium Chloride Crys Cr 20 Meq Tbcr (Potassium chloride crys cr) .... Take 4  tabs by mouth in the am, 4 at noon, and 4 at hs 10)  Ranitidine Hcl 300 Mg Caps (Ranitidine hcl) .... Take 1 capsule by mouth once a day 11)  Zocor 10 Mg Tabs (Simvastatin) .... Take 1 tablet by mouth once a day 12)  Nexium 40 Mg Cpdr (Esomeprazole magnesium) .[name] Kitchen.. 1 tablet by mouth twice daily 13)  Calcium 500 500 Mg Tabs (Calcium carbonate) .[name] Kitchen.. 1 tablet by mouth daily 14)  Methotrexate 2.5 Mg Tabs (Methotrexate sodium) .... Take as directed by dr. Charlestine Night 12.5 mg (5 pills) on saturday and sunday 15)  Lyrica 100  Mg Caps (Pregabalin) .[name] Kitchen.. 1 tab by mouth three times a day 16)  Lantus 100 Unit/ml Soln (Insulin glargine) .... 30 units in the am and 30 units in the pm. dispense one month supply. 17)  Warfarin Sodium 5 Mg Tabs (Warfarin sodium) .... Take as directed 18)  Folic Acid 1 Mg Tabs (Folic acid) .[name] Kitchen.. 1 tablet by mouth daily while on methotrexate 19)  Clonidine Hcl 0.2 Mg Tabs (Clonidine hcl) .... 2 by mouth at bedtime for depression 20)  Doxepin Hcl 25 Mg Caps (Doxepin hcl) .... Take 1 capsule by mouth once a day at bedtime 21)  Iron 325 (65 Fe) Mg Tabs (Ferrous sulfate) .... One by mouth three times a day 22)  Novolog 100 Unit/ml Soln (Insulin aspart) .[name] Kitchen.. 15 units subcutaneous injection  prior to breakfast and lunch and eveing meal.  disp: 1 month supply. 23)  Vitamin D 1000 Unit Tabs (Cholecalciferol) .... Take 1 tabs daily 24)  Bd Insulin Syringe Ultrafine 30g X 1/2" 1 Ml Misc (Insulin syringe-needle u-100) .... Dispense supply for four times per day dosing of insulin. 25)  Hydrocodone-acetaminophen 5-325 Mg Tabs (Hydrocodone-acetaminophen) .[name] Kitchen.. 1 by mouth up to 4 times per day as needed for pain 26)  Ventolin Hfa 108 (90 Base) Mcg/act Aers (Albuterol sulfate) .... Take one puff q4 hours. 27)  Spironolactone 100 Mg Tabs (Spironolactone)  .... Once daily in am 28)  Prednisone 5 Mg Tabs (Prednisone) .... Once daily 29)  Bethanechol Chloride 5 Mg Tabs (Bethanechol chloride) .... One by mouth two times a day 30)  Diflucan 100 Mg Tabs (Fluconazole) .... One by mouth x 1 31)  Cipro 250 Mg Tabs (Ciprofloxacin hcl) .[name] Kitchen.. 1 by mouth 2 times daily 32)  Nystatin 100000 Unit/gm Powd (Nystatin) .... Apply to afected area two times a day as needed rash. for groin and pannus.  Patient Instructions: 1)  It was nice to see you today! 2)  I am sending to urology. We will call you with the referral information. 3)  We will call you if you have another urinary tract infection. Prescriptions: BETHANECHOL CHLORIDE 5 MG TABS (BETHANECHOL CHLORIDE) one by mouth two times a day  #60 x 0   Entered and Authorized by:   Briscoe Deutscher DO   Signed by:   Briscoe Deutscher DO on 10/14/2009   Method used:   Electronically to        [facility] [phone]* (retail)       [place], [place]  [address]       Ph: [phone]       Fax: [phone]   RxID:   [id number]   Laboratory Results   Urine Tests  Date/Time Received: October 14, 2009 3:46 PM  Date/Time Reported: October 14, 2009 3:50 PM   Routine Urinalysis   Color: yellow Appearance: Clear Glucose: 500   (Normal Range: Negative) Bilirubin: negative   (Normal Range: Negative) Ketone: negative   (Normal Range: Negative) Spec. Gravity: 1.010   (Normal Range: 1.003-1.035) Blood: negative   (Normal Range: Negative) pH: 6.5   (Normal Range: 5.0-8.0) Protein: negative   (Normal Range: Negative) Urobilinogen: 0.2   (Normal Range: 0-1) Nitrite: negative   (Normal Range: Negative) Leukocyte Esterace: negative   (Normal Range: Negative)    Comments: Cath specimen ...........test performed by...........[name] KitchenHedy Camara, CMA

## 2010-09-15 NOTE — Progress Notes (Signed)
Summary: med refills  Phone Note Refill Request   Refills Requested: Medication #1:  POTASSIUM CHLORIDE CRYS CR 20 MEQ TBCR Take 3 tablet by mouth twice times a day and 2 tablets once daily  Medication #2:  DOXEPIN HCL 100 MG CAPS Take 1 capsule by mouth at bedtime refill to [facility].  Initial call taken by: AMY MARTIN RN,  May 21, 2008 12:03 PM      Prescriptions: DOXEPIN HCL 25 MG  CAPS (DOXEPIN HCL) 1 tablet by mouth qhs  #31 x 9   Entered and Authorized by:   Dixon Boos MD   Signed by:   Dixon Boos MD on 05/21/2008   Method used:   Electronically to        [facility] (retail)       [address]       [place], [place]  [address]       Ph: [phone]       Fax: [phone]   RxID:   [id number] POTASSIUM CHLORIDE CRYS CR 20 MEQ TBCR (POTASSIUM CHLORIDE CRYS CR) Take 3 tablet by mouth twice times a day and 2 tablets once daily  #240 x 11   Entered and Authorized by:   Dixon Boos MD   Signed by:   Dixon Boos MD on 05/21/2008   Method used:   Electronically to        [facility] (retail)       [place], [place]  [address]       Ph: [phone]       Fax: [phone]   RxID:   [id number] DOXEPIN HCL 100 MG CAPS (DOXEPIN HCL) Take 1 capsule by mouth at bedtime  #30 x 9   Entered and Authorized by:   Dixon Boos MD   Signed by:   Dixon Boos MD on 05/21/2008   Method used:   Electronically to        [facility] (retail)       [address]       [place], [place]  [address]       Ph: [phone]       Fax: [phone]   RxID:   [id number]

## 2010-09-15 NOTE — Miscellaneous (Signed)
Summary: Orders Update  Clinical Lists Changes  Problems: Added new problem of SLEEP APNEA (ICD-780.57) Orders: Added new Referral order of Home Health Referral [facility]) - Signed

## 2010-09-15 NOTE — Progress Notes (Signed)
Summary: REFILL REQ  Phone Note Call from Patient Call back at Home Phone [phone]   Caller: NIECE Summary of Call: REQUESTING REFILL ON DOXEPIN 25 MG PT HAS ONLY ONE LEFT Initial call taken by: Lupita Raider CMA,,  January 26, 2008 9:32 AM      Prescriptions: DOXEPIN HCL 25 MG  CAPS (DOXEPIN HCL) 1 tablet by mouth qhs  #31 x 1   Entered by:   [name] CMA,   Authorized by:   Dixon Boos MD   Signed by:   [name] CMA, on 01/26/2008   Method used:   Electronically sent to ...       [facility]       [place], [place]  [address]       Ph: [phone]       Fax: [phone]   RxID:   [id number]

## 2010-09-15 NOTE — Progress Notes (Signed)
Summary: meds prob  Phone Note Refill Request Call back at Home Phone [phone] Message from:  Patient  Refills Requested: Medication #1:  NOVOLOG 100 UNIT/ML SOLN 15 units subcutaneous injection  prior to breakfast 20 units with lunch and 20 units with eveing meal.  Disp: 1 month supply.   Notes: doc went up on insulin and is now out and needs it called in asap Initial call taken by: Audie Clear,  March 12, 2010 9:12 AM  Follow-up for Phone Call        I called & spoke with the niece who is at the drug store now. told her it was sent electronically Follow-up by: Elige Radon RN,  March 12, 2010 9:26 AM    Prescriptions: LANTUS 100 UNIT/ML SOLN (INSULIN GLARGINE) 60 units in the am and 60 units in the pm. Dispense one month supply.  #4 x 11   Entered by:   Elige Radon RN   Authorized by:   Briscoe Deutscher DO   Signed by:   Elige Radon RN on 03/12/2010   Method used:   Electronically to        [facility] [phone]* (retail)       [place], [place]  [address]       Ph: [phone]       Fax: [phone]   RxID:   [id number]

## 2010-09-15 NOTE — Assessment & Plan Note (Signed)
Summary: cough & congestion/[place]/wallace   Vital Signs:  Patient profile:   60 year old female Height:      61 inches Weight:      225.5 pounds BMI:     42.76 Temp:     97.7 degrees F oral Pulse rate:   83 / minute BP sitting:   109 / 71  (left arm) Cuff size:   large  Vitals Entered By: Isabelle Course (December 12, 2009 10:11 AM) CC: C/O cough , congestion and HA Is Patient Diabetic? Yes Did you bring your meter with you today? No Pain Assessment Patient in pain? no        Primary Care Provider:  Briscoe Deutscher [id number]  CC:  C/O cough  and congestion and HA.  History of Present Illness: 60 yo female with extensive medical history presenting with cough, nasal congestion and RIGHT temporal headache x4 weeks.  Cough is worse at night.  PMH sig for asthma and poorly controlled DM2.  Headache only occurs while coughing.  Is on Advair for asthma.  Is on Prednisone 5 chronically for gout.  CBGs reviewed today, are generally running high 200s low 300s, with occasional in the 400s.  She takes Metformin 500 two times a day, Lantus 60 two times a day, Novolog 20 three times a day.  Habits & Providers  Alcohol-Tobacco-Diet     Tobacco Status: never  Allergies: 1)  ! Codeine 2)  ! Penicillin 3)  ! Sulfa 4)  ! * Lithium 5)  ! Valium 6)  ! * Cogentin 7)  ! Lisinopril (Lisinopril) 8)  ! * Hyddal 9)  ! Diphenhydramine Hcl (Diphenhydramine Hcl) 10)  Thioridazine Hcl (Thioridazine Hcl)  Physical Exam  Additional Exam:  VITALS:  Reviewed, normal GEN: Alert & oriented, no acute distress, frequent coughing NECK: Midline trachea, no masses/thyromegaly, no cervical lymphadenopathy CARDIO: Regular rate and rhythm, no murmurs/rubs/gallops, 2+ bilateral radial pulses RESP: Clear to auscultation, normal work of breathing, no retractions/accessory muscle use EYES:  No corneal or conjunctival inflammation noted. EOMI. PERRLA.  Vision grossly normal. EARS:  External ear without significant  lesions or deformities.  Clear canals, TM intact bilaterally without bulging, retraction, inflammation or discharge. Hearing grossly normal bilaterally. NOSE:  Nasal mucosa are pink and moist without lesions or exudates. MOUTH:  Erythematous oropharynx, moist mucous membranes    Impression & Recommendations:  Problem # 1:  ASTHMA, UNSPECIFIED (ICD-493.90)  Mild exacerbation today.  5 day Prednisone burst.  Follow up in 3 days. Her updated medication list for this problem includes:    Advair Diskus 500-50 Mcg/dose Misc (Fluticasone-salmeterol) .[name] Kitchen... 1 puff inh two times a day    Ventolin Hfa 108 (90 Base) Mcg/act Aers (Albuterol sulfate) .[name] Kitchen... Take one puff q4 hours.    Prednisone 5 Mg Tabs (Prednisone) ..... Once daily    Prednisone 50 Mg Tabs (Prednisone) .[name] Kitchen... 1 tab by mouth daily x5 days  Orders: [facility]- Est  Level 4 [id number])  Problem # 2:  DIABETES MELLITUS, TYPE I, UNCONTROLLED (ICD-250.03)  Problem list says DM1, but suspect this is erroneous.  Likely DM2 as patient is on Metformin.  Will flag PCP. Chronic poor control.  Will get worse with steroid burst (see #1 above), so will ask her to add 5 units to her morning Lantus x5 days.  Encourage extra water intake by mouth. Her updated medication list for this problem includes:    Metformin Hcl 500 Mg Tabs (Metformin hcl) .[name] Kitchen... Take 1 tablet by mouth twice a  day    Lantus 100 Unit/ml Soln (Insulin glargine) .[name] KitchenMarland KitchenMarland KitchenMarland Kitchen 60 units in the am and 60 units in the pm. dispense one month supply.    Novolog 100 Unit/ml Soln (Insulin aspart) .[name] KitchenMarland KitchenMarland KitchenMarland Kitchen 15 units subcutaneous injection  prior to breakfast 20 units with lunch and 20 units with eveing meal.  disp: 1 month supply.  Orders: [place]- Est  Level 4 ([id number])  Complete Medication List: 1)  Advair Diskus 500-50 Mcg/dose Misc (Fluticasone-salmeterol) .[name] Kitchen.. 1 puff inh two times a day 2)  Allopurinol 300 Mg Tabs (Allopurinol) .... Take 1  tablet by mouth twice a day per dr trueslow 3)  Ascensia Elite Test  Strp (Glucose blood) .... Test blood sugarfour times daily 4)  Doxepin Hcl 100 Mg Caps (Doxepin hcl) .... Take 1 capsule by mouth at bedtime 5)  Epipen 2-pak 0.3 Mg/0.70ml (1:1000) Devi (Epinephrine hcl (anaphylaxis)) .... Inject 1 pen subcutaneously as directed 6)  Lasix 80 Mg Tabs (Furosemide) .... Take 1 tablet by mouth twice a day 7)  Lexapro 10 Mg Tabs (Escitalopram oxalate) .... Take 1 tablet by mouth once a day 8)  Metformin Hcl 500 Mg Tabs (Metformin hcl) .... Take 1 tablet by mouth twice a day 9)  Potassium Chloride Crys Cr 20 Meq Tbcr (Potassium chloride crys cr) .... Take 4  tabs by mouth in the am, 4 at noon, and 4 at hs 10)  Ranitidine Hcl 300 Mg Caps (Ranitidine hcl) .... Take 1 capsule by mouth once a day 11)  Zocor 10 Mg Tabs (Simvastatin) .... Take 1 tablet by mouth once a day 12)  Nexium 40 Mg Cpdr (Esomeprazole magnesium) .[name] Kitchen.. 1 tablet by mouth twice daily 13)  Calcium 500 500 Mg Tabs (Calcium carbonate) .[name] Kitchen.. 1 tablet by mouth daily 14)  Methotrexate 2.5 Mg Tabs (Methotrexate sodium) .... Take as directed by dr. Charlestine Night 12.5 mg (5 pills) on saturday and sunday 15)  Lyrica 100 Mg Caps (Pregabalin) .[name] Kitchen.. 1 tab by mouth three times a day 16)  Lantus 100 Unit/ml Soln (Insulin glargine) .... 60 units in the am and 60 units in the pm. dispense one month supply. 17)  Warfarin Sodium 5 Mg Tabs (Warfarin sodium) .... Take as directed 18)  Folic Acid 1 Mg Tabs (Folic acid) .[name] Kitchen.. 1 tablet by mouth daily while on methotrexate 19)  Clonidine Hcl 0.2 Mg Tabs (Clonidine hcl) .... 2 by mouth at bedtime for depression 20)  Doxepin Hcl 25 Mg Caps (Doxepin hcl) .... Take 1 capsule by mouth once a day at bedtime 21)  Iron 325 (65 Fe) Mg Tabs (Ferrous sulfate) .... One by mouth three times a day 22)  Novolog 100 Unit/ml Soln (Insulin aspart) .[name] Kitchen.. 15 units subcutaneous injection  prior to breakfast 20 units with lunch and 20 units with eveing meal.  disp: 1 month supply. 23)  Vitamin D 1000 Unit Tabs  (Cholecalciferol) .... Take 1 tabs daily 24)  Bd Insulin Syringe Ultrafine 30g X 1/2" 1 Ml Misc (Insulin syringe-needle u-100) .... Dispense supply for four times per day dosing of insulin. 25)  Hydrocodone-acetaminophen 5-325 Mg Tabs (Hydrocodone-acetaminophen) .[name] Kitchen.. 1 by mouth up to 4 times per day as needed for pain 26)  Ventolin Hfa 108 (90 Base) Mcg/act Aers (Albuterol sulfate) .... Take one puff q4 hours. 27)  Spironolactone 100 Mg Tabs (Spironolactone) .... Once daily in am 28)  Prednisone 5 Mg Tabs (Prednisone) .... Once daily 29)  Bethanechol Chloride 5 Mg Tabs (Bethanechol chloride) .... One by mouth two times a day  30)  Diflucan 100 Mg Tabs (Fluconazole) .... One by mouth x 1 31)  Nystatin 100000 Unit/gm Powd (Nystatin) .... Apply to afected area two times a day as needed rash. for groin and pannus. 32)  Prednisone 50 Mg Tabs (Prednisone) .[name] Kitchen.. 1 tab by mouth daily x5 days  Patient Instructions: 1)  Always a pleasure to see you Ms. [name]. 2)  Take Prednisone 50 mg once daily for 5 days.  This will raise your blood sugars, so take an extra 5 units of Lantus with your morning dose for the next 5 days:  65 units Lantus every morning and 60 units Lantus in the evening. 3)  Drink plenty of extra water the next few days. 4)  Please schedule a follow-up appointment in 3 days. Prescriptions: PREDNISONE 50 MG TABS (PREDNISONE) 1 tab by mouth daily x5 days  #5 x 0   Entered and Authorized by:   Graciella Belton MD   Signed by:   Graciella Belton MD on 12/12/2009   Method used:   Electronically to        [facility] [phone]* (retail)       [place], [place]  [address]       Ph: [phone]       Fax: [phone]   RxID:   [id number]

## 2010-09-15 NOTE — Progress Notes (Signed)
Summary: triage  Phone Note Call from Patient Call back at Home Phone [phone]   Caller: Patient Summary of Call: pt stubbed her toe and now she has several bruises and a blood blister.  she is on blood thinner and they need to know what to do. Initial call taken by: Audie Clear,  March 12, 2009 8:48 AM  Follow-up for Phone Call        spoke with Helene Kelp, her niece. she reports she stubbed her toe 2-3 days ago. large blood blister on tip of toe. painful. appt at 11am with her pcp Follow-up by: Elige Radon RN,  March 12, 2009 9:05 AM

## 2010-09-15 NOTE — Miscellaneous (Signed)
Summary: prior auth nexium  Clinical Lists Changes prior auth for nexium to pcp.Elige Radon RN  Jan 08, 2009 4:44 PM  form completed and faxed. Dixon Boos MD  Jan 10, 2009 12:27 PM

## 2010-09-15 NOTE — Assessment & Plan Note (Signed)
Summary: f/u L leg wound and IDDM   Vital Signs:  Patient profile:   60 year old female Height:      60 inches Weight:      209 pounds BMI:     40.97 Temp:     96.9 degrees F oral Pulse rate:   88 / minute BP sitting:   119 / 77  (left arm) Cuff size:   regular  Vitals Entered By: Schuyler Amor CMA (June 19, 2009 9:43 AM) CC: f/u L leg wound Is Patient Diabetic? Yes Pain Assessment Patient in pain? yes     Location: leg Intensity: 4   Primary Care Provider:  Briscoe Deutscher DO  CC:  f/u L leg wound.  History of Present Illness: 60yo F here to f/u on L leg wound  L leg wound: Pt seen in clinic 3 days by Dr. Lindell Noe.  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  She is accompanied by her niece Helene Kelp) today.  Both state that the wound looks better.  No longer draining.  No fevers or chills associated.  Pain improved.  IDDM: Currently on Lantus 42 units at bedtime, Metformin 500mg  two times a day, and Glipizide 10mg  daily.  Morning CBGs avg >400.  States that she feels bad when her blood sugars are that high.  Polyuria, polydipsia, and more fatigue.  Denies any complications when administering the lantus.    Habits & Providers  Alcohol-Tobacco-Diet     Tobacco Status: never  Current Medications (verified): 1)  Advair Diskus 500-50 Mcg/dose Misc (Fluticasone-Salmeterol) .[name] Kitchen.. 1 Puff Inh Two Times A Day 2)  Albuterol 90 Mcg/act Aers (Albuterol) .... Inhale 1 Puff Using Inhaler Four Times A Day 3)  Allopurinol 300 Mg Tabs (Allopurinol) .... Take 1  Tablet By Mouth Twice A Day Per Dr Jolee Ewing 4)  Ascensia Elite Test  Strp (Glucose Blood) .... Test Blood Sugar Twice Daily 5)  Darvocet-N 100 100-650 Mg Tabs (Propoxyphene N-Apap) .... Take 1 Tablet At Bedtime As Needed 6)  Doxepin Hcl 100 Mg Caps (Doxepin Hcl) .... Take 1 Capsule By Mouth At Bedtime 7)  Epipen 2-Pak 0.3 Mg/0.26ml (1:1000) Devi (Epinephrine Hcl (Anaphylaxis)) .... Inject 1 Pen  Subcutaneously As Directed 8)  Lasix 80 Mg Tabs (Furosemide) .... Take 1 Tablet By Mouth Twice A Day 9)  Lexapro 10 Mg Tabs (Escitalopram Oxalate) .... Take 1 Tablet By Mouth Once A Day 10)  Metformin Hcl 500 Mg Tabs (Metformin Hcl) .... Take 1 Tablet By Mouth Twice A Day 11)  Potassium Chloride Crys Cr 20 Meq Tbcr (Potassium Chloride Crys Cr) .... Take 3 Tabs By Mouth in The Am, 3 At Noon, and 3 At [facility] 12)  Ranitidine Hcl 300 Mg Caps (Ranitidine Hcl) .... Take 1 Capsule By Mouth Once A Day 13)  Spironolactone 50 Mg Tabs (Spironolactone) .... Take 1 Tablet By Mouth Once A Day 14)  Zocor 10 Mg Tabs (Simvastatin) .... Take 1 Tablet By Mouth Once A Day 15)  Nexium 40 Mg  Cpdr (Esomeprazole Magnesium) .[name] Kitchen.. 1 Tablet By Mouth Twice Daily 16)  Calcium 500 500 Mg  Tabs (Calcium Carbonate) .[name] Kitchen.. 1 Tablet By Mouth Daily 17)  Methotrexate 2.5 Mg  Tabs (Methotrexate Sodium) .... Take As Directed By Dr. Charlestine Night 5 Mg On Saturday and Sunday 18)  Oxybutynin Chloride 5 Mg  Tabs (Oxybutynin Chloride) .[name] Kitchen.. 1 Tablet By Mouth Two Times A Day As Directed 19)  Lyrica 100 Mg  Caps (  Pregabalin) .[name] Kitchen.. 1 Tab By Mouth Three Times A Day 20)  Lantus 100 Unit/ml Soln (Insulin Glargine) .... 45 Units Daily At Bedtime 21)  Warfarin Sodium 5 Mg Tabs (Warfarin Sodium) .... Take As Directed 22)  Folic Acid 1 Mg Tabs (Folic Acid) .[name] Kitchen.. 1 Tablet By Mouth Daily While On Methotrexate 23)  Clonidine Hcl 0.2 Mg Tabs (Clonidine Hcl) .... 2 By Mouth At Bedtime For Depression 24)  Amitriptyline Hcl 25 Mg Tabs (Amitriptyline Hcl) .... Take One Tablet At Bedtime 25)  Prednisone 5 Mg Tabs (Prednisone) .[name] Kitchen.. 1 Daily By Dr Jolee Ewing 26)  Ferrous Sulfate 325 (65 Fe) Mg  Tbec (Ferrous Sulfate) .... 3x Per Day 27)  Doxepin Hcl 25 Mg Caps (Doxepin Hcl) .... Take 1 Capsule By Mouth Once A Day At Bedtime 28)  Propoxyphene N-Apap 100-650 Mg Tabs (Propoxyphene N-Apap) .[name] Kitchen.. 1 Tablet At Bedtime As Needed 29)  Iron 325 (65 Fe) Mg Tabs (Ferrous Sulfate) ....  One By Mouth Three Times A Day 30)  Hydrochlorothiazide 25 Mg Tabs (Hydrochlorothiazide) .[name] Kitchen.. 1 Tab Two Times A Day 31)  Novolog 100 Unit/ml Soln (Insulin Aspart) .... 5 Units Subcutaneous Injection At Each Meal Disp: 1 Vial  Allergies (verified): 1)  ! Codeine 2)  ! Penicillin 3)  ! Sulfa 4)  ! * Lithium 5)  ! Valium 6)  ! * Cogentin 7)  ! Lisinopril (Lisinopril) 8)  ! * Hyddal  Review of Systems       Polyuria, polydipsia, and more fatigue. No longer draining.  No fevers or chills associated.  Pain improved.  Physical Exam  General:  VS reviewed.  Well appearing, obese, NAD Msk:  L leg exam: 1 x 1/2 cm superficial ulcer of the anterior aspect of the L lower leg; surrounding erythema c/w inflammatory process; no drainage; mildly ttp of surrounding skin; no other ulcers observed  2+ dp pulses; sensation grossly intact 5/5 strength lower ext   Impression & Recommendations:  Problem # 1:  VENOUS STASIS ULCER (ICD-454.0) Assessment Improved  Wound is healing; erythematous skin more c/w inflammatory process than infection; will apply una boot wrapping for another week and have her f/u with Dr. Juleen China next week.  I think the wounds etiology is multifactorial- both venous stasis and poorly controlled DM.  Once the wound is healed, she will need to be mindful of wearing her compression stockings, keeping the legs elevated, and ambulating as much as possible.  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  Plan to stop the glipizide today as this probably not helping.  Will add meal time coverage- Novolog 5units.  Increase the Lantus to 45 units.  She is to f/u in 1 week with her log of CBGs to adjust her regimen accordingly.  Pt had intolerance of ACE in the past.  Pt discussed with Nicholas Lose and medications reviewed by pharmacy student.     The following medications were removed from the medication list:     Glipizide 10 Mg Tb24 (Glipizide) .[name] Kitchen... Take 1 tablet by mouth once a day Her updated medication list for this problem includes:    Metformin Hcl 500 Mg Tabs (Metformin hcl) .[name] Kitchen... Take 1 tablet by mouth twice a day    Lantus 100 Unit/ml Soln (Insulin glargine) .[name] KitchenMarland KitchenMarland KitchenMarland Kitchen 45 units daily at bedtime    Novolog 100 Unit/ml Soln (Insulin aspart) .[name] KitchenMarland KitchenMarland KitchenMarland Kitchen 5 units subcutaneous injection at each meal disp: 1 vial  Orders: [facility]- Est  Level 4 ([address]214)  Complete Medication List: 1)  Advair Diskus 500-50 Mcg/dose Misc (Fluticasone-salmeterol) .[name] Kitchen.. 1 puff inh two times a day 2)  Albuterol 90 Mcg/act Aers (Albuterol) .... Inhale 1 puff using inhaler four times a day 3)  Allopurinol 300 Mg Tabs (Allopurinol) .... Take 1  tablet by mouth twice a day per dr trueslow 4)  Ascensia Elite Test Strp (Glucose blood) .... Test blood sugar twice daily 5)  Darvocet-n 100 100-650 Mg Tabs (Propoxyphene n-apap) .... Take 1 tablet at bedtime as needed 6)  Doxepin Hcl 100 Mg Caps (Doxepin hcl) .... Take 1 capsule by mouth at bedtime 7)  Epipen 2-pak 0.3 Mg/0.75ml (1:1000) Devi (Epinephrine hcl (anaphylaxis)) .... Inject 1 pen subcutaneously as directed 8)  Lasix 80 Mg Tabs (Furosemide) .... Take 1 tablet by mouth twice a day 9)  Lexapro 10 Mg Tabs (Escitalopram oxalate) .... Take 1 tablet by mouth once a day 10)  Metformin Hcl 500 Mg Tabs (Metformin hcl) .... Take 1 tablet by mouth twice a day 11)  Potassium Chloride Crys Cr 20 Meq Tbcr (Potassium chloride crys cr) .... Take 3 tabs by mouth in the am, 3 at noon, and 3 at hs 12)  Ranitidine Hcl 300 Mg Caps (Ranitidine hcl) .... Take 1 capsule by mouth once a day 13)  Spironolactone 50 Mg Tabs (Spironolactone) .... Take 1 tablet by mouth once a day 14)  Zocor 10 Mg Tabs (Simvastatin) .... Take 1 tablet by mouth once a day 15)  Nexium 40 Mg Cpdr (Esomeprazole magnesium) .[name] Kitchen.. 1 tablet by mouth twice daily 16)  Calcium 500 500 Mg Tabs (Calcium carbonate) .[name] Kitchen.. 1 tablet by mouth daily 17)   Methotrexate 2.5 Mg Tabs (Methotrexate sodium) .... Take as directed by dr. Charlestine Night 5 mg on saturday and sunday 18)  Oxybutynin Chloride 5 Mg Tabs (Oxybutynin chloride) .[name] Kitchen.. 1 tablet by mouth two times a day as directed 19)  Lyrica 100 Mg Caps (Pregabalin) .[name] Kitchen.. 1 tab by mouth three times a day 20)  Lantus 100 Unit/ml Soln (Insulin glargine) .... 45 units daily at bedtime 21)  Warfarin Sodium 5 Mg Tabs (Warfarin sodium) .... Take as directed 22)  Folic Acid 1 Mg Tabs (Folic acid) .[name] Kitchen.. 1 tablet by mouth daily while on methotrexate 23)  Clonidine Hcl 0.2 Mg Tabs (Clonidine hcl) .... 2 by mouth at bedtime for depression 24)  Amitriptyline Hcl 25 Mg Tabs (Amitriptyline hcl) .... Take one tablet at bedtime 25)  Prednisone 5 Mg Tabs (Prednisone) .[name] Kitchen.. 1 daily by dr Jolee Ewing 26)  Ferrous Sulfate 325 (65 Fe) Mg Tbec (Ferrous sulfate) .... 3x per day 27)  Doxepin Hcl 25 Mg Caps (Doxepin hcl) .... Take 1 capsule by mouth once a day at bedtime 28)  Propoxyphene N-apap 100-650 Mg Tabs (Propoxyphene n-apap) .[name] Kitchen.. 1 tablet at bedtime as needed 29)  Iron 325 (65 Fe) Mg Tabs (Ferrous sulfate) .... One by mouth three times a day 30)  Hydrochlorothiazide 25 Mg Tabs (Hydrochlorothiazide) .[name] Kitchen.. 1 tab two times a day 31)  Novolog 100 Unit/ml Soln (Insulin aspart) .... 5 units subcutaneous injection at each meal disp: 1 vial  Patient Instructions: 1)  Schedule follow up appt in 1 week in Geriatric clinic to reassess diabetes and leg wound 2)  Changes: 1. Increase the lantus to 45 units daily.  2.  Start Novolog 5 units at each meal 3.  Stop the Glipizide 3)  Bring your glucose log next time. 4)  Keep the leg wrapped for another week  and elevated as much as possible. 5)  Call us if you develop worsening pain or fever Prescriptions: NOVOLOG 100 UNIT/ML SOLN (INSULIN ASPART) 5 units subcutaneous injection at each meal Disp: 1 vial  #1 x 1   Entered by:   Dion Body  MD   Authorized by:   Candelaria Celeste MD   Signed  by:   Dion Body  MD on 06/19/2009   Method used:   Electronically to        [facility] [phone]* (retail)       [place], [place]  [address]       Ph: [phone]       Fax: [phone]   RxID:   [id number]    Prevention & Chronic Care Immunizations   Influenza vaccine: Fluvax 3+  (05/21/2009)   Influenza vaccine due: 06/19/2008    Tetanus booster: 08/16/2001: Done.   Tetanus booster due: 08/17/2011    Pneumococcal vaccine: Done.  (09/16/2004)   Pneumococcal vaccine due: None  Colorectal Screening   Hemoccult: Done.  (12/14/2000)   Hemoccult due: Not Indicated    Colonoscopy: Done.  (03/16/2006)   Colonoscopy due: 03/16/2016  Other Screening   Pap smear: Done.  (08/16/2006)   Pap smear action/deferral: Deferred  (05/21/2009)   Pap smear due: 08/16/2009    Mammogram: ASSESSMENT: Negative - BI-RADS 1^MM DIGITAL SCREENING  (03/25/2009)   Mammogram due: 07/18/2009   Smoking status: never  (06/19/2009)  Diabetes Mellitus   HgbA1C: 10.0   (05/06/2009)   Hemoglobin A1C due: 09/30/2007    Eye exam: Not documented    Foot exam: yes  (07/04/2008)   High risk foot: Not documented   Foot care education: Not documented   Foot exam due: 05/03/2008    Urine microalbumin/creatinine ratio: Not documented    Diabetes flowsheet reviewed?: Yes   Progress toward A1C goal: Unchanged  Lipids   Total Cholesterol: Not documented   LDL: 96  (02/28/2008)   LDL Direct: 96  (02/28/2008)   HDL: Not documented   Triglycerides: Not documented    SGOT (AST): 19  (05/21/2009)   SGPT (ALT): 39  (05/21/2009)   Alkaline phosphatase: 99  (05/21/2009)   Total bilirubin: 0.5  (05/21/2009)  Hypertension   Last Blood Pressure: 119 / 77  (06/19/2009)   Serum creatinine: 1.29  (05/21/2009)   Serum potassium 3.0  (05/21/2009)  Self-Management Support :   Personal Goals (by the next clinic visit) :     Personal A1C goal: 7  (05/21/2009)      Personal blood pressure goal: 130/80  (05/21/2009)     Personal LDL goal: 100  (05/21/2009)    Diabetes self-management support: Written self-care plan  (05/21/2009)    Hypertension self-management support: Written self-care plan  (05/21/2009)    Lipid self-management support: Written self-care plan  (05/21/2009)

## 2010-09-15 NOTE — Assessment & Plan Note (Signed)
Summary: f/u visit   Vital Signs:  Patient profile:   60 year old female Height:      61.75 inches Weight:      200.1 pounds BMI:     37.03 Temp:     97.7 degrees F Pulse rate:   69 / minute Pulse rhythm:   regular BP sitting:   114 / 76  (left arm)  Vitals Entered By: Janeth Rase LPN (April 28, [id number] PM) CC: Follow up visit. Is Patient Diabetic? Yes  Pain Assessment Patient in pain? yes     Location: chest with inspiration Intensity: 6 Type: sharp Onset of pain  Chronic   History of Present Illness: 61yo WF with recent hospitalization for asthma exaceration vs volume overload.  Pt required Bipap machine in hospital and was diuresed for volume overload.   1. asthma- breathing much better, but still not quite at baseline.  using advair two times a day and albuterol q4h.  no dyspnea at rest, but has sob with exertion.  uncertain if she is wheezing.  c/o chest pain in center of chest with cough and deep inspiration.  Not discharged on any steroids or antibiotics.  2. volume overload- CXR in hospital showed vascular congestion.  EF on 2D echo was wnl, however.  She was overdiuresed in the hospital and had some electrolye abnormalities.  She was discharged home on lasix 40mg  two times a day (half of usual dose) & they have since increased back up to 80mg  two times a day.  Needs potassium checked today.    3.  anticoagulation for recent PE- due for INR check today.  4. chronic wound RLE- started as dog scratch, now venous insuf ulcer.  States improving, less drainage, use dry dressings daily.  5.  ? OSA- has sleep study in mid-May.  CPAP machine has already been delivered to their house.  Pt tolerated machine and felt good while using it in the hospital.  6.  joint pain- off prednisone after ashtma flare and now joints hurting all over.  Has appt with Dr. Charlestine Night in mid-May.  Taking MTX as prescribed.  Physical Exam  General:  alert, well-developed, well-nourished, and  well-hydrated.   Head:  normocephalic and atraumatic.   Mouth:  dry mucous membranes, poor dentition Lungs:  Normal respiratory effort, chest expands symmetrically. Lungs are clear to auscultation, no crackles or wheezes. Heart:  Normal rate and regular rhythm. S1 and S2 normal without gallop, murmur, click, rub or other extra sounds. Extremities:  trace pitting edema b/l extremities (less swelling than baseline) Skin:  RLE venous insuf wound improved, very superficial, minimal drainage.  Surrounding redness, but most likely from chronic venous insuf changes. Psych:  normally interactive and good eye contact.     Impression & Recommendations:  Problem # 1:  ASTHMA, UNSPECIFIED (ICD-493.90) recent hospitalization.  cont advair and albuterol.  No wheezing today, but subjective dyspnea.  suspect deconditioning contributing to dyspnea. Her updated medication list for this problem includes:    Advair Diskus 500-50 Mcg/dose Misc (Fluticasone-salmeterol) .[name] Kitchen... 1 puff inh two times a day    Albuterol 90 Mcg/act Aers (Albuterol) ..... Inhale 1 puff using inhaler four times a day  Orders: [place]- Est  Level 4 [id number])  Problem # 2:  PE (ICD-415.19) INR check today. Plan to cont coumadin x 6 months after recent PE.  This is her 1st documented blood clot, although she says she has had 3 before when she was 60yrs old (neice says she  is making this up). Off premarin now. Her updated medication list for this problem includes:    Warfarin Sodium 5 Mg Tabs (Warfarin sodium) .[name] Kitchen... Take as directed  Problem # 3:  OSTEOARTHRITIS, MULTI SITES (ICD-715.98) advised to call Dr. Charlestine Night to ask if he can restart prednisone for chronic arthritis or move her appointment sooner. Her updated medication list for this problem includes:    Darvocet-n 100 100-650 Mg Tabs (Propoxyphene n-apap) .[name] Kitchen... Take 1 tablet by mouth every six hours- needs appointment before any more refills  Problem # 4:  HYPOKALEMIA  (ICD-276.8) BMET today- related to chronic diuretic use. Orders: [facility] [id number]) [place]- Est  Level 4 [id number])  Problem # 5:  VENOUS STASIS ULCER, CHRONIC (ICD-454.0) improving slowly, very supericial at this point.  daily dry dressing changes.  Problem # 6:  SLEEP APNEA (ICD-780.57) sleep study mid-May, already has CPAP machine delivered to home, just need sleep study to order setting for machine.  Problem # 7:  LEG EDEMA, BILATERAL (ICD-782.3) pt has resumed previous dose of lasix and continues other diuretics listed below.  EF normal on recent ECHO.  I wonder i pulmonary HTN may be playing a role given underlying asthma and likely OSA.  Perhaps starting CPAP machine after sleep study can help with her swelling, in which case I HOPE we can cut back on some of her diuretics Her updated medication list for this problem includes:    Hydrochlorothiazide 25 Mg Tabs (Hydrochlorothiazide) .[name] Kitchen... Take 1 tablet by mouth twice a day    Lasix 80 Mg Tabs (Furosemide) .[name] Kitchen... Take 1 tablet by mouth twice a day    Spironolactone 50 Mg Tabs (Spironolactone) .[name] Kitchen... Take 1 tablet by mouth once a day  Complete Medication List: 1)  Advair Diskus 500-50 Mcg/dose Misc (Fluticasone-salmeterol) .[name] Kitchen.. 1 puff inh two times a day 2)  Albuterol 90 Mcg/act Aers (Albuterol) .... Inhale 1 puff using inhaler four times a day 3)  Allopurinol 300 Mg Tabs (Allopurinol) .... Take 1 tablet by mouth twice a day 4)  Ascensia Elite Test Strp (Glucose blood) .... Test blood sugar twice daily 5)  Clonidine Hcl 0.1 Mg Tabs (Clonidine hcl) .[name] Kitchen.. 1 tablet by mouth two times a day 6)  Colchicine 0.6 Mg Tabs (Colchicine) .... Take 1 tablet by mouth twice a day 7)  Darvocet-n 100 100-650 Mg Tabs (Propoxyphene n-apap) .... Take 1 tablet by mouth every six hours- needs appointment before any more refills 8)  Diovan 40 Mg Tabs (Valsartan) .... Take 1 tablet by mouth once a day 9)  Doxepin Hcl 100 Mg Caps (Doxepin hcl) .... Take 1  capsule by mouth at bedtime 10)  Epipen 2-pak 0.3 Mg/0.77ml (1:1000) Devi (Epinephrine hcl (anaphylaxis)) .... Inject 1 pen subcutaneously as directed 11)  Glipizide 10 Mg Tb24 (Glipizide) .... Take 1 tablet by mouth once a day 12)  Hydrochlorothiazide 25 Mg Tabs (Hydrochlorothiazide) .... Take 1 tablet by mouth twice a day 13)  Lasix 80 Mg Tabs (Furosemide) .... Take 1 tablet by mouth twice a day 14)  Lexapro 10 Mg Tabs (Escitalopram oxalate) .... Take 1 tablet by mouth once a day 15)  Metformin Hcl 500 Mg Tabs (Metformin hcl) .... Take 1 tablet by mouth twice a day 16)  Potassium Chloride Crys Cr 20 Meq Tbcr (Potassium chloride crys cr) .... Take 3 tablet by mouth twice times a day and 2 tablets once daily 17)  Ranitidine Hcl 300 Mg Caps (Ranitidine hcl) .... Take 1 capsule by mouth once  a day 18)  Spironolactone 50 Mg Tabs (Spironolactone) .... Take 1 tablet by mouth once a day 19)  Zocor 10 Mg Tabs (Simvastatin) .... Take 1 tablet by mouth once a day 20)  Nexium 40 Mg Cpdr (Esomeprazole magnesium) .[name] Kitchen.. 1 tablet by mouth twice daily 21)  Calcium 500 500 Mg Tabs (Calcium carbonate) .[name] Kitchen.. 1 tablet by mouth tid 22)  Methotrexate 2.5 Mg Tabs (Methotrexate sodium) .... Take as directed by dr. Charlestine Night 23)  Doxepin Hcl 25 Mg Caps (Doxepin hcl) .[name] Kitchen.. 1 tablet by mouth qhs 24)  Oxybutynin Chloride 5 Mg Tabs (Oxybutynin chloride) .[name] Kitchen.. 1 tablet by mouth two times a day as directed 25)  Lyrica 100 Mg Caps (Pregabalin) .[name] Kitchen.. 1 tab by mouth three times a day 26)  Lantus 100 Unit/ml Soln (Insulin glargine) .... 30 units subcutaneously qhs 27)  Warfarin Sodium 5 Mg Tabs (Warfarin sodium) .... Take as directed 28)  Folic Acid 1 Mg Tabs (Folic acid) .[name] Kitchen.. 1 tablet by mouth daily while on methotrexate  Other Orders: INR/PT-[facility] [id number])  Patient Instructions: 1)  keep sleep study 2)  ask Dr. Charlestine Night for prednisone OR sooner appointment. 3)  Try to pick up the walking if your breathing will allow. 4)  Follow up  with Dr. Ronnald Ramp the last week of May.    ANTICOAGULATION RECORD PREVIOUS REGIMEN & LAB RESULTS Anticoagulation Diagnosis:  PE on  11/22/2008 Previous INR Goal Range:  2-3 on  11/22/2008 Previous INR:  3.0 on  11/29/2008 Previous Coumadin Dose(mg):  3MG  TABLETS on  11/22/2008 Previous Regimen:  5mg  Mon & Thur; 2.5mg  other days on  11/29/2008  NEW REGIMEN & LAB RESULTS Current INR: 2.0 Regimen: continue 5mg  Mon & Thur; 2.5mg  other days  Provider: Dr. Ronnald Ramp Repeat testing in: 2 weeks Other Comments: ...........test performed by...........[name] KitchenHedy Camara, CMA   Dose has been reviewed with patient or caretaker during this visit. Reviewed by: D. Delsa Sale, CMA

## 2010-09-15 NOTE — Assessment & Plan Note (Signed)
Summary: fu/tcb   Vital Signs:  Patient profile:   60 year old female Height:      61 inches Weight:      205 pounds BMI:     38.87 Pulse rate:   100 / minute BP sitting:   137 / 82  (right arm)  Vitals Entered By: [name] CMA, (August 18, 2009 3:00 PM) CC: d/c darvocet. discuss new pain medications. leg pain 10/10 since d/c darvocet. Is Patient Diabetic? Yes Pain Assessment Patient in pain? yes     Location: legs Intensity: 10 Onset of pain  x 1 week   CC:  d/c darvocet. discuss new pain medications. leg pain 10/10 since d/c darvocet..  Habits & Providers  Alcohol-Tobacco-Diet     Tobacco Status: quit  Current Medications (verified): 1)  Advair Diskus 500-50 Mcg/dose Misc (Fluticasone-Salmeterol) .[name] Kitchen.. 1 Puff Inh Two Times A Day 2)  Albuterol 90 Mcg/act Aers (Albuterol) .... Inhale 1 Puff Using Inhaler Four Times A Day 3)  Allopurinol 300 Mg Tabs (Allopurinol) .... Take 1  Tablet By Mouth Twice A Day Per Dr Jolee Ewing 4)  Ascensia Elite Test  Strp (Glucose Blood) .... Test Blood Sugarfour Times Daily 5)  Doxepin Hcl 100 Mg Caps (Doxepin Hcl) .... Take 1 Capsule By Mouth At Bedtime 6)  Epipen 2-Pak 0.3 Mg/0.60ml (1:1000) Devi (Epinephrine Hcl (Anaphylaxis)) .... Inject 1 Pen Subcutaneously As Directed 7)  Lasix 80 Mg Tabs (Furosemide) .... Take 1 Tablet By Mouth Twice A Day 8)  Lexapro 10 Mg Tabs (Escitalopram Oxalate) .... Take 1 Tablet By Mouth Once A Day 9)  Metformin Hcl 500 Mg Tabs (Metformin Hcl) .... Take 1 Tablet By Mouth Twice A Day 10)  Potassium Chloride Crys Cr 20 Meq Tbcr (Potassium Chloride Crys Cr) .... Take 3 Tabs By Mouth in The Am, 3 At Noon, and 3 At [facility] 11)  Ranitidine Hcl 300 Mg Caps (Ranitidine Hcl) .... Take 1 Capsule By Mouth Once A Day 12)  Spironolactone 50 Mg Tabs (Spironolactone) .... Take 1 Tablet Daily in The Morning 13)  Zocor 10 Mg Tabs (Simvastatin) .... Take 1 Tablet By Mouth Once A Day 14)  Nexium 40 Mg  Cpdr (Esomeprazole Magnesium)  .[name] Kitchen.. 1 Tablet By Mouth Twice Daily 15)  Calcium 500 500 Mg  Tabs (Calcium Carbonate) .[name] Kitchen.. 1 Tablet By Mouth Daily 16)  Methotrexate 2.5 Mg  Tabs (Methotrexate Sodium) .... Take As Directed By Dr. Charlestine Night 5 Mg On Saturday and Sunday 17)  Oxybutynin Chloride 5 Mg  Tabs (Oxybutynin Chloride) .[name] Kitchen.. 1 Tablet By Mouth Two Times A Day As Directed 18)  Lyrica 100 Mg  Caps (Pregabalin) .[name] Kitchen.. 1 Tab By Mouth Three Times A Day 19)  Lantus 100 Unit/ml Soln (Insulin Glargine) .... 50 Units Daily At Bedtime. Dispense One Month Supply. 20)  Warfarin Sodium 5 Mg Tabs (Warfarin Sodium) .... Take As Directed 21)  Folic Acid 1 Mg Tabs (Folic Acid) .[name] Kitchen.. 1 Tablet By Mouth Daily While On Methotrexate 22)  Clonidine Hcl 0.2 Mg Tabs (Clonidine Hcl) .... 2 By Mouth At Bedtime For Depression 23)  Amitriptyline Hcl 25 Mg Tabs (Amitriptyline Hcl) .... Take One Tablet At Bedtime 24)  Doxepin Hcl 25 Mg Caps (Doxepin Hcl) .... Take 1 Capsule By Mouth Once A Day At Bedtime 25)  Iron 325 (65 Fe) Mg Tabs (Ferrous Sulfate) .... One By Mouth Three Times A Day 26)  Hydrochlorothiazide 25 Mg Tabs (Hydrochlorothiazide) .[name] Kitchen.. 1 Tab Two Times A Day 27)  Novolog 100  Unit/ml Soln (Insulin Aspart) .... 8 Units Subcutaneous Injection  Prior To Breakfast and Lunch.  Take 15 Units Prior To Eveing Meal.  Disp: 1 Month Supply. 28)  Vitamin D 1000 Unit Tabs (Cholecalciferol) .... Take 1 Tabs Daily 29)  Bd Insulin Syringe Ultrafine 30g X 1/2" 1 Ml Misc (Insulin Syringe-Needle U-100) .... Dispense Supply For Four Times Per Day Dosing of Insulin. 30)  Hydrocodone-Acetaminophen 5-325 Mg Tabs (Hydrocodone-Acetaminophen) .[name] Kitchen.. 1 By Mouth Up To 4 Times Per Day As Needed For Pain  Allergies (verified): 1)  ! Codeine 2)  ! Penicillin 3)  ! Sulfa 4)  ! * Lithium 5)  ! Valium 6)  ! * Cogentin 7)  ! Lisinopril (Lisinopril) 8)  ! * Hyddal 9)  Thioridazine Hcl (Thioridazine Hcl)  Past History:  Past medical, surgical, family and social histories  (including risk factors) reviewed for relevance to current acute and chronic problems.  Past Medical History: Reviewed history from 06/24/2009 and no changes required. 1. Amenorrhea since 60 years old 2. Barretts Esophagus - EGD 8/07. (Repeat 2-3 years) 3. Nephrolithiasis 2/03 4. Colonoscopy- benign polyps (12/2000), repeat colonoscopy performed in 2007 5. Endometrial biopsy 10/03 benign columnar mucosa (limited material) 6. Asthma: PFT's (10/14/06)- mild obstructive airway dz, poor effort 7. Obesity-hypoventilation syndrome: uses CPAP at night. 8. Obesity 9. Diastolic CHF: EF [id number] on echo (5/10) 10.  Pulmonary embolus (4/10): ? trigger.  11.  Acute pericarditis/pleuritis (5/10): Echo (5/10) showed EF 55%, possible posterior HK, > 25% respiratory variation of mitral inflow suggesting some interventricular interdependence, IVC not significantly dilated.  There was a small organized pericardial effusion and a moderate left pleural effusion. Some evidence suggesting constrictive physiology.  ESR was 108. Patient treated with high dose ASA and colchicine.  12. CKD: last creatinine 1.12 13. Chronic anemia 14. Diabetes mellitus type II 15. Gout 16. Rheumatoid arthritis (followed by Dr. Charlestine Night). 17. Depression 18. GERD 19. HTN 20. Hyperlipidemia 21. Schizophrenia 22. Polypharmacy 23. 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 Chronic Coumadin. 25. Hypokalemia 26. Vitamin D 22 (10/6)  Past Surgical History: Reviewed history from 05/21/2009 and no changes required. Appendectomy - 08/28/2001 Bladder stretching - 08/28/2001 Cholecystectomy - 08/28/2001 B/L knee replacement- Sept & Nov 2008 (Dr. Alvan Dame)  Family History: Reviewed history from 04/25/2009 and no changes required. 6 brothers and 6 sisters., Father died of black lung, Mother with bone cancer, One brother with lung cancer and another with` blood` cancer., One sister died of colon cancer., Strong FH of DM, CAD, and kidney problems  Social  History: Reviewed history from 10/13/2006 and no changes required. Divorced for greater than 30 years.  Born in [place] but lived in [place] for most of her life. No children. Stopped smoking in 1973, no etoh, no drug use.  Pt was adopted.  Not working secondary to schizophrenia.  Niece, Helene Kelp, helps care for her.  Not sexually active.  Review of Systems General:  Denies chills and fever. CV:  Denies chest pain or discomfort. Resp:  Complains of cough; denies shortness of breath. GI:  Denies change in bowel habits. MS:  Denies joint pain, joint redness, joint swelling, and cramps. Neuro:  Complains of numbness, poor balance, and tingling.  Physical Exam  General:  Well-developed, well-nourished, in no acute distress; alert, appropriate and cooperative throughout examination. Vitals reviewed. Lungs:  CTAB w/o wheeze or crackles  Heart:  tachycardic, distant but regular with no murmurs or rubs  Pulses:  2 + dp. Skin:  Venous stasis changes legs bilaterally. Severe  pain with light touch of bilateral lower extremities. Psych:  Normally interactive, good eye contact, and not anxious appearing.     Impression & Recommendations:  Problem # 1:  DIABETIC PERIPHERAL NEUROPATHY (ICD-250.60)  Her updated medication list for this problem includes:    Metformin Hcl 500 Mg Tabs (Metformin hcl) .[name] Kitchen... Take 1 tablet by mouth twice a day    Lantus 100 Unit/ml Soln (Insulin glargine) .[name] KitchenMarland KitchenMarland KitchenMarland Kitchen 50 units daily at bedtime. dispense one month supply.    Novolog 100 Unit/ml Soln (Insulin aspart) .[name] Kitchen... 8 units subcutaneous injection  prior to breakfast and lunch.  take 15 units prior to eveing meal.  disp: 1 month supply.  Orders: [facility]- Est  Level 4 [id number])  Problem # 2:  DIABETES MELLITUS, TYPE I, UNCONTROLLED (ICD-250.03)  Her updated medication list for this problem includes:    Metformin Hcl 500 Mg Tabs (Metformin hcl) .[name] Kitchen... Take 1 tablet by mouth twice a day    Lantus 100 Unit/ml Soln (Insulin glargine) .[name] KitchenMarland KitchenMarland KitchenMarland Kitchen 50  units daily at bedtime. dispense one month supply.    Novolog 100 Unit/ml Soln (Insulin aspart) .[name] Kitchen... 8 units subcutaneous injection  prior to breakfast and lunch.  take 15 units prior to eveing meal.  disp: 1 month supply.  Orders: [place]- Est  Level 4 [id number])  Problem # 3:  ANEMIA (ICD-285.9)  Her updated medication list for this problem includes:    Folic Acid 1 Mg Tabs (Folic acid) .[name] Kitchen... 1 tablet by mouth daily while on methotrexate    Iron 325 (65 Fe) Mg Tabs (Ferrous sulfate) ..... One by mouth three times a day  Orders: [facility] [id number]) [facility]- Est  Level 4 [id number])  Problem # 4:  HYPOKALEMIA (ICD-276.8)  Orders: [facility] [id number]) [facility]- Est  Level 4 [id number])  Problem # 5:  COUMADIN THERAPY (ICD-V58.61)  Orders: INR/PT-[facility] [id number]) [place]- Est  Level 4 [id number])  Complete Medication List: 1)  Advair Diskus 500-50 Mcg/dose Misc (Fluticasone-salmeterol) .[name] Kitchen.. 1 puff inh two times a day 2)  Albuterol 90 Mcg/act Aers (Albuterol) .... Inhale 1 puff using inhaler four times a day 3)  Allopurinol 300 Mg Tabs (Allopurinol) .... Take 1  tablet by mouth twice a day per dr trueslow 4)  Ascensia Elite Test Strp (Glucose blood) .... Test blood sugarfour times daily 5)  Doxepin Hcl 100 Mg Caps (Doxepin hcl) .... Take 1 capsule by mouth at bedtime 6)  Epipen 2-pak 0.3 Mg/0.40ml (1:1000) Devi (Epinephrine hcl (anaphylaxis)) .... Inject 1 pen subcutaneously as directed 7)  Lasix 80 Mg Tabs (Furosemide) .... Take 1 tablet by mouth twice a day 8)  Lexapro 10 Mg Tabs (Escitalopram oxalate) .... Take 1 tablet by mouth once a day 9)  Metformin Hcl 500 Mg Tabs (Metformin hcl) .... Take 1 tablet by mouth twice a day 10)  Potassium Chloride Crys Cr 20 Meq Tbcr (Potassium chloride crys cr) .... Take 3 tabs by mouth in the am, 3 at noon, and 3 at hs 11)  Ranitidine Hcl 300 Mg Caps (Ranitidine hcl) .... Take 1 capsule by mouth once a day 12)  Spironolactone 50 Mg Tabs (Spironolactone) .... Take 1 tablet  daily in the morning 13)  Zocor 10 Mg Tabs (Simvastatin) .... Take 1 tablet by mouth once a day 14)  Nexium 40 Mg Cpdr (Esomeprazole magnesium) .[name] Kitchen.. 1 tablet by mouth twice daily 15)  Calcium 500 500 Mg Tabs (Calcium carbonate) .[name] Kitchen.. 1 tablet by mouth daily 16)  Methotrexate 2.5 Mg Tabs (Methotrexate sodium) .... Take as  directed by dr. Charlestine Night 5 mg on saturday and sunday 17)  Oxybutynin Chloride 5 Mg Tabs (Oxybutynin chloride) .[name] Kitchen.. 1 tablet by mouth two times a day as directed 18)  Lyrica 100 Mg Caps (Pregabalin) .[name] Kitchen.. 1 tab by mouth three times a day 19)  Lantus 100 Unit/ml Soln (Insulin glargine) .... 50 units daily at bedtime. dispense one month supply. 20)  Warfarin Sodium 5 Mg Tabs (Warfarin sodium) .... Take as directed 21)  Folic Acid 1 Mg Tabs (Folic acid) .[name] Kitchen.. 1 tablet by mouth daily while on methotrexate 22)  Clonidine Hcl 0.2 Mg Tabs (Clonidine hcl) .... 2 by mouth at bedtime for depression 23)  Amitriptyline Hcl 25 Mg Tabs (Amitriptyline hcl) .... Take one tablet at bedtime 24)  Doxepin Hcl 25 Mg Caps (Doxepin hcl) .... Take 1 capsule by mouth once a day at bedtime 25)  Iron 325 (65 Fe) Mg Tabs (Ferrous sulfate) .... One by mouth three times a day 26)  Hydrochlorothiazide 25 Mg Tabs (Hydrochlorothiazide) .[name] Kitchen.. 1 tab two times a day 27)  Novolog 100 Unit/ml Soln (Insulin aspart) .... 8 units subcutaneous injection  prior to breakfast and lunch.  take 15 units prior to eveing meal.  disp: 1 month supply. 28)  Vitamin D 1000 Unit Tabs (Cholecalciferol) .... Take 1 tabs daily 29)  Bd Insulin Syringe Ultrafine 30g X 1/2" 1 Ml Misc (Insulin syringe-needle u-100) .... Dispense supply for four times per day dosing of insulin. 30)  Hydrocodone-acetaminophen 5-325 Mg Tabs (Hydrocodone-acetaminophen) .[name] Kitchen.. 1 by mouth up to 4 times per day as needed for pain  Other Orders: [facility] [id number])  Patient Instructions: 1)  Please keep a detailed log of your meals each day so that we can review them at  the next visit. Prescriptions: ASCENSIA ELITE TEST  STRP (GLUCOSE BLOOD) test blood sugarfour times daily  #120 x 3   Entered and Authorized by:   Briscoe Deutscher DO   Signed by:   Briscoe Deutscher DO on 08/18/2009   Method used:   Print then Give to Patient   RxID:   [id number] LYRICA 100 MG  CAPS (PREGABALIN) 1 tab by mouth three times a day  #90 x 6   Entered and Authorized by:   Briscoe Deutscher DO   Signed by:   Briscoe Deutscher DO on 08/18/2009   Method used:   Print then Give to Patient   RxID:   [id number] HYDROCODONE-ACETAMINOPHEN 5-325 MG TABS (HYDROCODONE-ACETAMINOPHEN) 1 by mouth up to 4 times per day as needed for pain  #60 x 0   Entered and Authorized by:   Briscoe Deutscher DO   Signed by:   Briscoe Deutscher DO on 08/18/2009   Method used:   Print then Give to Patient   RxID:   [id number]   Laboratory Results   Blood Tests   Date/Time Received: August 18, 2009 3:05 PM  Date/Time Reported: August 18, 2009 3:47 PM   HGBA1C: 10.3%   (Normal Range: Non-Diabetic - 3-6%   Control Diabetic - 6-8%)  INR: 2.1   (Normal Range: 0.88-1.12   Therap INR: 2.0-3.5) Comments: ...............test performed by......[name] KitchenBonnie A. Martinique, MLS (ASCP)cm       ANTICOAGULATION RECORD PREVIOUS REGIMEN & LAB RESULTS Anticoagulation Diagnosis:  PE on  11/22/2008 Previous INR Goal Range:  2-3 on  11/22/2008 Previous INR:  1.9 on  07/28/2009 Previous Coumadin Dose(mg):  5mg  tablets on  11/25/2008 Previous Regimen:  continue:  5 mg - M, W, F;   2.5  mg - other days on  07/28/2009 Previous Coagulation Comments:  pt fell in the bathroom  yesterday hitting her head on the portable heater,  no symptoms of bleeding, Dr. Juleen China saw pt and questioned about fall  on  05/19/2009  NEW REGIMEN & LAB RESULTS Current INR: 2.1 Regimen: continue:  5 mg - M, W, F;   2.5 mg - other days  Provider: Juleen China Repeat testing in: 3 weeks  09-08-09 Other Comments: ...............test performed  by......[name] KitchenBonnie A. Martinique, MLS (ASCP)cm   Dose has been reviewed with patient or caretaker during this visit. Reviewed by: Barkley Bruns (ASCP)cm  Prevention & Chronic Care Immunizations   Influenza vaccine: Fluvax 3+  (05/21/2009)   Influenza vaccine due: 06/19/2008    Tetanus booster: 08/16/2001: Done.   Tetanus booster due: 08/17/2011    Pneumococcal vaccine: Done.  (09/16/2004)   Pneumococcal vaccine due: None  Colorectal Screening   Hemoccult: Done.  (12/14/2000)   Hemoccult due: Not Indicated    Colonoscopy: Done.  (03/16/2006)   Colonoscopy due: 03/16/2016  Other Screening   Pap smear: Done.  (08/16/2006)   Pap smear action/deferral: Deferred  (05/21/2009)   Pap smear due: 08/16/2009    Mammogram: ASSESSMENT: Negative - BI-RADS 1^MM DIGITAL SCREENING  (03/25/2009)   Mammogram due: 07/18/2009   Smoking status: quit  (08/18/2009)  Diabetes Mellitus   HgbA1C: 10.3  (08/18/2009)   Hemoglobin A1C due: 09/30/2007    Eye exam: Not documented    Foot exam: yes  (07/04/2008)   High risk foot: Not documented   Foot care education: Not documented   Foot exam due: 05/03/2008    Urine microalbumin/creatinine ratio: Not documented    Diabetes flowsheet reviewed?: Yes   Progress toward A1C goal: Unchanged  Lipids   Total Cholesterol: 169  (06/30/2009)   LDL: 49  (06/30/2009)   LDL Direct: 96  (02/28/2008)   HDL: 43  (06/30/2009)   Triglycerides: 384  (06/30/2009)    SGOT (AST): 19  (05/21/2009)   SGPT (ALT): 39  (05/21/2009)   Alkaline phosphatase: 99  (05/21/2009)   Total bilirubin: 0.5  (05/21/2009)    Lipid flowsheet reviewed?: Yes   Progress toward LDL goal: Improved  Hypertension   Last Blood Pressure: 137 / 82  (08/18/2009)   Serum creatinine: 1.04  (06/24/2009)   Serum potassium 3.2  (06/24/2009)    Hypertension flowsheet reviewed?: Yes   Progress toward BP goal: Deteriorated  Self-Management Support :   Personal Goals (by the next clinic  visit) :     Personal A1C goal: 8  (07/13/2009)     Personal blood pressure goal: 130/80  (05/21/2009)     Personal LDL goal: 100  (05/21/2009)    Patient will work on the following items until the next clinic visit to reach self-care goals:     Medications and monitoring: take my medicines every day  (08/18/2009)     Eating: drink diet soda or water instead of juice or soda, eat more vegetables, use fresh or frozen vegetables, eat foods that are low in salt, eat baked foods instead of fried foods, eat fruit for snacks and desserts, limit or avoid alcohol  (08/18/2009)    Diabetes self-management support: CBG self-monitoring log, Written self-care plan  (08/18/2009)   Diabetes care plan printed    Hypertension self-management support: Written self-care plan  (08/18/2009)   Hypertension self-care plan printed.    Hypertension self-management support not done because: Good outcomes  (07/25/2009)    Lipid self-management  support: Written self-care plan  (08/18/2009)   Lipid self-care plan printed.    Lipid self-management support not done because: Good outcomes  (07/25/2009)

## 2010-09-15 NOTE — Assessment & Plan Note (Signed)
Summary: Hospital FU for Pericarditis/ Pleural Effusion   Vital Signs:  Patient profile:   60 year old female Height:      61.75 inches Weight:      205.2 pounds BMI:     37.97 Temp:     97.8 degrees F oral Pulse rate:   103 / minute BP sitting:   109 / 72  (left arm) Cuff size:   regular  Vitals Entered By: Levert Feinstein LPN (May 20, [id number] AM) CC: hospital f/u   History of Present Illness:  PERICARDITIS (ICD-423.9) No chest pain or unusuall shortness of breath with exertion.  Has not taken any aspirin since discharge.  They did not know they were supposed to and thought would interact with warfarin.  PLEURAL EFFUSION (ICD-511.9) Feels well no shortness of breath with walking or lying down.  Minimal nonproductive cough. 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  No hemoptysis or bleeding elsewhere.  Wearing compression stockings, no focal leg swelling  HYPOKALEMIA (ICD-276.8) taking all medications as per med list.  No cramping   GOUT, UNSPECIFIED (ICD-274.9) Saw Dr Greer Ee recently who started her on low dose prednisone, increased her allopurinol.  She feels her pain is under pretty good control.  No specific joint swelling or redness  DIABETES MELLITUS II, UNCOMPLICATED ([id number]) BS running in 100s-200s but not indicated on her list which are fasting or not.  No polyuria or dipsia  ROS - as above PMH - Medications reviewed and updated in medication list.  Smoking Status noted in VS form       Habits & Providers     Tobacco Status: never  Current Medications (verified): 1)  Advair Diskus 500-50 Mcg/dose Misc (Fluticasone-Salmeterol) .[name] Kitchen.. 1 Puff Inh Two Times A Day 2)  Albuterol 90 Mcg/act Aers (Albuterol) .... Inhale 1 Puff Using Inhaler Four Times A Day 3)  Allopurinol 300 Mg Tabs (Allopurinol) .... Take 2  Tablet By Mouth Twice A Day Per Dr Jolee Ewing 4)  Ascensia Elite Test  Strp (Glucose Blood) .... Test Blood Sugar Twice  Daily 5)  Darvocet-N 100 100-650 Mg Tabs (Propoxyphene N-Apap) .... Take 1 Tablet By Mouth Every Six Hours- Needs Appointment Before Any More Refills 6)  Doxepin Hcl 100 Mg Caps (Doxepin Hcl) .... Take 1 Capsule By Mouth At Bedtime 7)  Epipen 2-Pak 0.3 Mg/0.56ml (1:1000) Devi (Epinephrine Hcl (Anaphylaxis)) .... Inject 1 Pen Subcutaneously As Directed 8)  Glipizide 10 Mg Tb24 (Glipizide) .... Take 1 Tablet By Mouth Once A Day 9)  Hydrochlorothiazide 25 Mg Tabs (Hydrochlorothiazide) .... Take 1 Tablet By Mouth Twice A Day 10)  Lasix 80 Mg Tabs (Furosemide) .... Take 1 Tablet By Mouth Twice A Day 11)  Lexapro 10 Mg Tabs (Escitalopram Oxalate) .... Take 1 Tablet By Mouth Once A Day 12)  Metformin Hcl 500 Mg Tabs (Metformin Hcl) .... Take 1 Tablet By Mouth Twice A Day 13)  Potassium Chloride Crys Cr 20 Meq Tbcr (Potassium Chloride Crys Cr) .... Take 3 Tablet By Mouth Twice Times A Day and 2 Tablets Once Daily 14)  Ranitidine Hcl 300 Mg Caps (Ranitidine Hcl) .... Take 1 Capsule By Mouth Once A Day 15)  Spironolactone 50 Mg Tabs (Spironolactone) .... Take 1 Tablet By Mouth Once A Day 16)  Zocor 10 Mg Tabs (Simvastatin) .... Take 1 Tablet By Mouth Once A Day 17)  Nexium 40 Mg  Cpdr (Esomeprazole Magnesium) .[name] Kitchen.. 1 Tablet By Mouth Twice Daily 18)  Calcium 500 500 Mg  Tabs (Calcium Carbonate) .[name] Kitchen.. 1 Tablet By Mouth Tid 19)  Methotrexate 2.5 Mg  Tabs (Methotrexate Sodium) .... Take As Directed By Dr. Charlestine Night 20)  Doxepin Hcl 25 Mg  Caps (Doxepin Hcl) .[name] Kitchen.. 1 Tablet By Mouth Qhs 21)  Oxybutynin Chloride 5 Mg  Tabs (Oxybutynin Chloride) .[name] Kitchen.. 1 Tablet By Mouth Two Times A Day As Directed 22)  Lyrica 100 Mg  Caps (Pregabalin) .[name] Kitchen.. 1 Tab By Mouth Three Times A Day 23)  Lantus 100 Unit/ml Soln (Insulin Glargine) .... 30 Units Subcutaneously Qhs 24)  Warfarin Sodium 5 Mg Tabs (Warfarin Sodium) .... Take As Directed 25)  Folic Acid 1 Mg Tabs (Folic Acid) .[name] Kitchen.. 1 Tablet By Mouth Daily While On Methotrexate 26)   Clonidine Hcl 0.2 Mg Tabs (Clonidine Hcl) .... 2 By Mouth At Bedtime For Depression 27)  Amitriptyline Hcl 25 Mg Tabs (Amitriptyline Hcl) .... Take One Tablet At Bedtime 28)  Prednisone 5 Mg Tabs (Prednisone) .[name] Kitchen.. 1 Daily By Dr Jolee Ewing 29)  Ferrous Sulfate 325 (65 Fe) Mg  Tbec (Ferrous Sulfate) .... 3x Per Day  Allergies: 1)  ! Codeine 2)  ! Penicillin 3)  ! Sulfa 4)  ! * Lithium 5)  ! Valium 6)  ! * Cogentin  Social History:    Smoking Status:  never  Physical Exam  General:  Well-developed,well-nourished,in no acute distress; alert,appropriate and cooperative throughout examination.  Able to get up and down from exam table with slight assistance.  Not wearing O2 Lungs:  mildly decreased breath sounds at left base.  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 Not symptomatic. 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 Not evident clinically and no symptoms.  Has not been on asa since discharge but is on low dose prednisone by her rheumatologist. Will defer to Dr Aundra Dubin but will not begin asa today since is doing well and would increase her risk of bleeding on coumadin.  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 Take as directed  Orders: [place]- Est  Level 4 [id number])  Problem # 4:  LEG EDEMA, BILATERAL (ICD-782.3) Would consider slowly decreasing her high dose lasix since this is not very effective for venous insufficiency Her updated medication  list for this problem includes:    Hydrochlorothiazide 25 Mg Tabs (Hydrochlorothiazide) .[name] Kitchen... Take 1 tablet by mouth twice a day    Lasix 80 Mg Tabs (Furosemide) .[name] Kitchen... Take 1 tablet by mouth twice a day    Spironolactone 50 Mg Tabs (Spironolactone) .[name] Kitchen... Take 1 tablet by mouth once a day  Problem # 5:  SLEEP APNEA (ICD-780.57) Recommended she schedule a sleep study once her effusions have resolved  Problem # 6:  GOUT, UNSPECIFIED (ICD-274.9) Need to get information from Dr Greer Ee - patient remarked that she understood him to say she did not have gout and stopped her colchine but increased her allopurinol?  Also not sure why she is  on prednisone?  The following medications were removed from the medication list:    Colchicine 0.6 Mg Tabs (Colchicine) .[name] Kitchen... Take 1 tablet by mouth twice a day Her updated medication list for this problem includes:    Allopurinol 300 Mg Tabs (Allopurinol) .[name] Kitchen... Take 2  tablet by mouth twice a day per dr trueslow  Problem # 7:  HYPOKALEMIA (ICD-276.8) On large dose of K considering on spironolactone.  Should recheck bmet in a few weeks  Complete Medication List: 1)  Advair Diskus 500-50 Mcg/dose Misc (Fluticasone-salmeterol) .[name] Kitchen.. 1 puff inh two times a day 2)  Albuterol 90 Mcg/act Aers (Albuterol) .... Inhale 1 puff using inhaler four times a day 3)  Allopurinol 300 Mg Tabs (Allopurinol) .... Take 2  tablet by mouth twice a day per dr trueslow 4)  Ascensia Elite Test Strp (Glucose blood) .... Test blood sugar twice daily 5)  Darvocet-n 100 100-650 Mg Tabs (Propoxyphene n-apap) .... Take 1 tablet by mouth every six hours- needs appointment before any more refills 6)  Doxepin Hcl 100 Mg Caps (Doxepin hcl) .... Take 1 capsule by mouth at bedtime 7)  Epipen 2-pak 0.3 Mg/0.41ml (1:1000) Devi (Epinephrine hcl (anaphylaxis)) .... Inject 1 pen subcutaneously as directed 8)  Glipizide 10 Mg Tb24 (Glipizide) .... Take 1 tablet by mouth once a day 9)  Hydrochlorothiazide  25 Mg Tabs (Hydrochlorothiazide) .... Take 1 tablet by mouth twice a day 10)  Lasix 80 Mg Tabs (Furosemide) .... Take 1 tablet by mouth twice a day 11)  Lexapro 10 Mg Tabs (Escitalopram oxalate) .... Take 1 tablet by mouth once a day 12)  Metformin Hcl 500 Mg Tabs (Metformin hcl) .... Take 1 tablet by mouth twice a day 13)  Potassium Chloride Crys Cr 20 Meq Tbcr (Potassium chloride crys cr) .... Take 3 tablet by mouth twice times a day and 2 tablets once daily 14)  Ranitidine Hcl 300 Mg Caps (Ranitidine hcl) .... Take 1 capsule by mouth once a day 15)  Spironolactone 50 Mg Tabs (Spironolactone) .... Take 1 tablet by mouth once a day 16)  Zocor 10 Mg Tabs (Simvastatin) .... Take 1 tablet by mouth once a day 17)  Nexium 40 Mg Cpdr (Esomeprazole magnesium) .[name] Kitchen.. 1 tablet by mouth twice daily 18)  Calcium 500 500 Mg Tabs (Calcium carbonate) .[name] Kitchen.. 1 tablet by mouth tid 19)  Methotrexate 2.5 Mg Tabs (Methotrexate sodium) .... Take as directed by dr. Charlestine Night 20)  Doxepin Hcl 25 Mg Caps (Doxepin hcl) .[name] Kitchen.. 1 tablet by mouth qhs 21)  Oxybutynin Chloride 5 Mg Tabs (Oxybutynin chloride) .[name] Kitchen.. 1 tablet by mouth two times a day as directed 22)  Lyrica 100 Mg Caps (Pregabalin) .[name] Kitchen.. 1 tab by mouth three times a day 23)  Lantus 100 Unit/ml Soln (Insulin glargine) .... 30 units subcutaneously qhs 24)  Warfarin Sodium 5 Mg Tabs (Warfarin sodium) .... Take as directed 25)  Folic Acid 1 Mg Tabs (Folic acid) .[name] Kitchen.. 1 tablet by mouth daily while on methotrexate 26)  Clonidine Hcl 0.2 Mg Tabs (Clonidine hcl) .... 2 by mouth at bedtime for depression 27)  Amitriptyline Hcl 25 Mg Tabs (Amitriptyline hcl) .... Take one tablet at bedtime 28)  Prednisone 5 Mg Tabs (Prednisone) .[name] Kitchen.. 1 daily by dr Jolee Ewing 29)  Ferrous Sulfate 325 (65 Fe) Mg Tbec (Ferrous sulfate) .... 3x per day  Other Orders: INR/PT-[facility] [id number])  Patient Instructions: 1)  Please schedule a follow-up appointment in 1 month to see Dr Ronnald Ramp 2)  See Dr Marigene Ehlers  May 24 3)  I will let you know about your CXR and whether to start back on asprin 4)  Come back in 1 week for a coumadin check Prescriptions: FERROUS SULFATE 325 (65 FE) MG  TBEC (FERROUS SULFATE) 3x per day  #200 x 3   Entered and Authorized by:   Talbert Cage MD   Signed by:   Talbert Cage MD on 01/02/2009   Method used:   Historical   RxIDWI:[id number]    ANTICOAGULATION RECORD PREVIOUS REGIMEN & LAB RESULTS Anticoagulation Diagnosis:  PE on  11/22/2008 Previous INR Goal Range:  2-3 on  11/22/2008 Previous INR:  1.5 on  12/31/2008 Previous Coumadin Dose(mg):  3MG  TABLETS on  11/22/2008 Previous Regimen:  5 mg - Tues & Wed on  12/31/2008 Previous Coagulation Comments:  f/u hospital and waiting for INR to be low enough to drain fluid from lungs on  12/27/2008  NEW REGIMEN & LAB RESULTS Current INR: 1.9 Regimen: 5mg  Mon & Thur; 2.5mg  other days  Provider: Dr. Ronnald Ramp Repeat testing in: 1 week Other Comments: ...........test performed by...........[name] KitchenHedy Camara, CMA   Dose has been reviewed with patient or caretaker during this visit. Reviewed by: Dr. Erin Hearing

## 2010-09-15 NOTE — Consult Note (Signed)
Summary: Rheumatology Consult  Rheumatology Consult   Imported By: Drucie Ip 10/25/2006 08:35:06  _____________________________________________________________________  External Attachment:    Type:   Image     Comment:   External Document

## 2010-09-15 NOTE — Miscellaneous (Signed)
Summary: visit note from [facility]- 05/15/06  CC: fu/ multiple issues, meet new doctor  HPI: This is my first time seeing Ms. Fernande Boyden as her primary MD.  Wendall Mola has MULTIPLE medical problems and is taking approximately 21 medications on a daily basis.  Her main concern is the constant pain in her legs bilaterally that has been there for months.  She attributes it to chronic gout, however, she is also concerned that low potassium may also be causing it.  She is on prednisone for chronic gout and understands the risks of chronic steroid use.  She is willing to take that risk because she sees the prednisone as contributing to her quality of life currently.  She has never seen a rheumatologist and considers her gout debilitating, although it is unchanged over the past months.  She takes 2 darvocets daily for chronic pain in her legs and does not wish to switch to a stronger pain medication.  She has developed diabetes in response to chronic steroid use, she says.  The only medication she takes is oral glipizide at the current time.  She does not check her sugars regularly.  Her most recent sugar was around 240 before lunch.  She says that the highest sugar she has seen in recent months is about 250 or so.  She denies symptoms of low blood sugar.  She has polyuria, but attributes that to diuretic use.  She also complains of chronic fluid retention in her legs bilaterally.  She has been maintained on high dose lasix and  HCTZ, which is contributing to her chronic hypokalemia.  She is adamant that she does not want to decrease her diuretics because the fluid overload in her lower extremities can be so painful.  She says she was diagnosed with schizophrenia at the age of 67.  She has been maintained on doxepin for years.  She says she has been hospitalized for schizophrenia in the past and is adamant that she does not wish to stop it.  Vitals noted.  BP 138/92  P 106  Gen- NAD, buffalo hump HEENT- mucous membranes  very dry Neck- supple, no thyromegaly CV- tachycardic, no murmur, regular rhythm, no JVD Lungs- CTA b/l Abd- obese, soft, NT/ ND, NABS Ext- 2+ pitting edema to knees bilaterally.  Lower extremities difusely tender to palpation diffusely.  No erythema, no warmth. skin- venous stasis changes on lower extremities b/l.  Numerous gouty tophi on arms and legs bilaterally.  Data: urinalysis negative.   HbA1C 6.6 EKG- sinus tachycardia, rate 105, RBBB not present in EKG from August 2006  A/P: 1.  Gout- she is already on allopurinol, colchicine, and prednisone daily.  Labs in the past were negative for RF.  Prednisone weaning has failed in the past according to her chart.  Given extent of disease and crippling nature, it is probably best to refer to rheumatology.  Chronic steroid use in this patient with multiple medical patients is not desirable and she understands the risks.  Of note, she has had a DEXA scan in March 2007 that was normal.  Continue current management including darvocet for now and f/u on rheum recommendations.  2.  HTN- adequate control with clonidine 0.4mg  qhs, spironolactone, hctz, lasix.  Will add ACEi since she has DM.  Will d/c clonidine if her BP drops too low.    3. Hypokalemia- likely due to diuretics.  BMET today.  Should improve with addition of ACEi.  Adjust KCl prn.  May be contributing to chronic  LE pain.  4.  Tachycardia- HR 106 today and has been 100-110 her last several visits.  I do not see a work-up in her chart.  EKG shows new RBBB.  She will be referred to cardiology for evaluation.  Consider cardiolyte as she has multiple medical problems associated with CAD, but no CAD in the past.  Echo in 12/2003 was a poor study, but grossly normal.  Will get TSH today.  5.  DM2- steroid-induced? On glipizide only.  Urine negative for glucose and protein.  A1C at goal and down from 7.6 in June 2007.  Instructed to check blood sugars at least once daily and bring log book at next  visit.  6.  Chronic lower extremity swelling- on high dose lasix, hctz, and spironolactone diuretics.  No documented CHF or CAD.  Perhaps due to chronic steroid use?  Continue current diuretics for now.  7.  Health Maintence- flu shot today.  Up to date on mammogram and colonoscopy.  Instructed to return for fasting lipids.  Pap smear due.

## 2010-09-15 NOTE — Letter (Signed)
Summary: Appointment - Reminder [place], [place]  [address] [place]   [place], [place] [address]   Phone: [phone]  Fax: [phone]     Jan 02, 2010 MRN: [id number]   [facility] [place], [place]  [address]   Dear Ms. Narez,  Our records indicate that it is time to schedule a follow-up appointment with Dr. Aundra Dubin. It is very important that we reach you to schedule this appointment. We look forward to participating in your health care needs. Please contact us at the number listed above at your earliest convenience to schedule your appointment.  If you are unable to make an appointment at this time, give [place] a call so we can update our records.     Sincerely,  [facility] Scheduling Team

## 2010-09-15 NOTE — Assessment & Plan Note (Signed)
Summary: f/u fri visit/dr t pt/eo   Vital Signs:  Patient profile:   60 year old female Weight:      217.6 pounds O2 Sat:      94 % on Room air Temp:     97.9 degrees F oral Pulse rate:   87 / minute Pulse rhythm:   regular BP sitting:   109 / 71  (left arm) Cuff size:   large  Vitals Entered By: Audelia Hives CMA (Dec 17, 2009 12:03 PM)  O2 Flow:  Room air  Serial Vital Signs/Assessments:                                PEF    PreRx  PostRx Time      O2 Sat  O2 Type     L/min  L/min  L/min   By 12:06 PM  94  %   Room air                          Audelia Hives CMA  Comments: 12:06 PM peak flow before neb tx  1)  190   2)  200   3)  250  By: Audelia Hives CMA  12:19 PM peak flow after neb tx 1)  300  2)  260   3)  270 By: Audelia Hives CMA    Primary Care Provider:  Briscoe Deutscher [phone]  CC:  congestion.  History of Present Illness: 1.  nasal congestion, post nasal drip, dry cough, tight breathing for about 4 weeks.  seen last week, and started on prednisone for 5 days for asthma exac.  helped some, but still with most of the symptoms.  night is the worse.  pt endorses hx of seasonal allergies.  is not been on allergy meds.  cough is bothering her the most of all of her symptoms.  wheezing some at night.  still able to use the cpap.  using ventolin every 4 hours, which helps a little, but does not relieve symptoms.  Current Medications (verified): 1)  Advair Diskus 500-50 Mcg/dose Misc (Fluticasone-Salmeterol) .[name] Kitchen.. 1 Puff Inh Two Times A Day 2)  Allopurinol 300 Mg Tabs (Allopurinol) .... Take 1  Tablet By Mouth Twice A Day Per Dr Jolee Ewing 3)  Ascensia Elite Test  Strp (Glucose Blood) .... Test Blood Sugarfour Times Daily 4)  Doxepin Hcl 100 Mg Caps (Doxepin Hcl) .... Take 1 Capsule By Mouth At Bedtime 5)  Epipen 2-Pak 0.3 Mg/0.49ml (1:1000) Devi (Epinephrine Hcl (Anaphylaxis)) .... Inject 1 Pen Subcutaneously As Directed 6)  Lasix 80 Mg Tabs (Furosemide) .... Take  1 Tablet By Mouth Twice A Day 7)  Lexapro 10 Mg Tabs (Escitalopram Oxalate) .... Take 1 Tablet By Mouth Once A Day 8)  Metformin Hcl 500 Mg Tabs (Metformin Hcl) .... Take 1 Tablet By Mouth Twice A Day 9)  Potassium Chloride Crys Cr 20 Meq Tbcr (Potassium Chloride Crys Cr) .... Take 4  Tabs By Mouth in The Am, 4 At Noon, and 4 At [facility] 10)  Ranitidine Hcl 300 Mg Caps (Ranitidine Hcl) .... Take 1 Capsule By Mouth Once A Day 11)  Zocor 10 Mg Tabs (Simvastatin) .... Take 1 Tablet By Mouth Once A Day 12)  Nexium 40 Mg  Cpdr (Esomeprazole Magnesium) .[name] Kitchen.. 1 Tablet By Mouth Twice Daily 13)  Calcium 500 500 Mg  Tabs (Calcium Carbonate) .[name] Kitchen.. 1 Tablet  By Mouth Daily 14)  Methotrexate 2.5 Mg  Tabs (Methotrexate Sodium) .... Take As Directed By Dr. Charlestine Night 12.5 Mg (5 Pills) On Saturday and Sunday 15)  Lyrica 100 Mg  Caps (Pregabalin) .[name] Kitchen.. 1 Tab By Mouth Three Times A Day 16)  Lantus 100 Unit/ml Soln (Insulin Glargine) .... 60 Units in The Am and 60 Units in The Pm. Dispense One Month Supply. 17)  Warfarin Sodium 5 Mg Tabs (Warfarin Sodium) .... Take As Directed 18)  Folic Acid 1 Mg Tabs (Folic Acid) .[name] Kitchen.. 1 Tablet By Mouth Daily While On Methotrexate 19)  Clonidine Hcl 0.2 Mg Tabs (Clonidine Hcl) .... 2 By Mouth At Bedtime For Depression 20)  Doxepin Hcl 25 Mg Caps (Doxepin Hcl) .... Take 1 Capsule By Mouth Once A Day At Bedtime 21)  Iron 325 (65 Fe) Mg Tabs (Ferrous Sulfate) .... One By Mouth Three Times A Day 22)  Novolog 100 Unit/ml Soln (Insulin Aspart) .[name] Kitchen.. 15 Units Subcutaneous Injection  Prior To Breakfast 20 Units With Lunch and 20 Units With Eveing Meal.  Disp: 1 Month Supply. 23)  Vitamin D 1000 Unit Tabs (Cholecalciferol) .... Take 1 Tabs Daily 24)  Bd Insulin Syringe Ultrafine 30g X 1/2" 1 Ml Misc (Insulin Syringe-Needle U-100) .... Dispense Supply For Four Times Per Day Dosing of Insulin. 25)  Hydrocodone-Acetaminophen 5-325 Mg Tabs (Hydrocodone-Acetaminophen) .[name] Kitchen.. 1 By Mouth Up To 4 Times Per Day As  Needed For Pain 26)  Ventolin Hfa 108 (90 Base) Mcg/act Aers (Albuterol Sulfate) .... Take One Puff Q4 Hours. 27)  Spironolactone 100 Mg Tabs (Spironolactone) .... Once Daily in Am 28)  Prednisone 5 Mg Tabs (Prednisone) .... Once Daily 29)  Bethanechol Chloride 5 Mg Tabs (Bethanechol Chloride) .... One By Mouth Two Times A Day 30)  Diflucan 100 Mg Tabs (Fluconazole) .... One By Mouth X 1 31)  Nystatin 100000 Unit/gm Powd (Nystatin) .... Apply To Afected Area Two Times A Day As Needed Rash. For Groin and Pannus.  Allergies: 1)  ! Codeine 2)  ! Penicillin 3)  ! Sulfa 4)  ! * Lithium 5)  ! Valium 6)  ! * Cogentin 7)  ! Lisinopril (Lisinopril) 8)  ! * Hyddal 9)  ! Diphenhydramine Hcl (Diphenhydramine Hcl) 10)  Thioridazine Hcl (Thioridazine Hcl)  Review of Systems General:  Denies fever and loss of appetite. ENT:  Complains of decreased hearing, hoarseness, nasal congestion, postnasal drainage, and sinus pressure. Resp:  Complains of cough and wheezing; denies coughing up blood.  Physical Exam  General:  alert, well-developed, and overweight-appearing.  no acute distress Ears:  tms slightly retracted Nose:  mild mucosal erythema and clear nasal drainage Mouth:  mild injection of throat; no exudate Neck:  no signficant lymphadenopathy  Lungs:  pre-bronchodilator:  no increased work of breathing.  somewhat decreased air movement.  wheezes with end-expiration  post bronchodilator:  improved air movement.  scattered wheezes Heart:  RRR no m/r/g Additional Exam:  vital signs reviewed    Impression & Recommendations:  Problem # 1:  ASTHMA, WITH ACUTE EXACERBATION (ICD-493.92) Assessment Unchanged  feels considerably better after neb treatment in the office.  I wonder if she is not getting good delivery of her albuterol throught the inhaler (although theoretically she should be with the spacer).  will get her a neb machine for home.  continue treatments q 4 hours as needed.  Also  treat allergic rhinitis, as this may be the precipitant of her current asthma exac.  follow up monday The following  medications were removed from the medication list:    Prednisone 50 Mg Tabs (Prednisone) .[name] Kitchen... 1 tab by mouth daily x5 days    Qvar 40 Mcg/act Aers (Beclomethasone dipropionate) .[name] Kitchen... 1 puff inhaled two times a day every day for asthma (prevention) Her updated medication list for this problem includes:    Advair Diskus 500-50 Mcg/dose Misc (Fluticasone-salmeterol) .[name] Kitchen... 1 puff inh two times a day    Ventolin Hfa 108 (90 Base) Mcg/act Aers (Albuterol sulfate) .[name] Kitchen... Take one puff q4 hours.    Prednisone 5 Mg Tabs (Prednisone) ..... Once daily    Albuterol Sulfate (5 Mg/ml) 0.5% Nebu (Albuterol sulfate) .[name] Kitchen... 1 treatment inhaled every 4 hours as needed wheezing/breathing tightness; dispense one box  Orders: [place]- Est  Level 4 [id number])  Problem # 2:  RHINITIS, ALLERGIC (ICD-477.9) Assessment: Deteriorated  wonder if this is what precipitated her asthma exacerbation.  will restart fluticasone (she has this at home, but has not used in long while or perhaps never used) and start cetirizine Her updated medication list for this problem includes:    Fluticasone Propionate 50 Mcg/act Susp (Fluticasone propionate) .[name] Kitchen... 2 sprays in each nostril daily for allergies    Cetirizine Hcl 10 Mg Tabs (Cetirizine hcl) .[name] Kitchen... 1 tab by mouth daily during allergy season  Orders: [facility]- Est  Level 4 [id number])  Complete Medication List: 1)  Advair Diskus 500-50 Mcg/dose Misc (Fluticasone-salmeterol) .[name] Kitchen.. 1 puff inh two times a day 2)  Allopurinol 300 Mg Tabs (Allopurinol) .... Take 1  tablet by mouth twice a day per dr trueslow 3)  Ascensia Elite Test Strp (Glucose blood) .... Test blood sugarfour times daily 4)  Doxepin Hcl 100 Mg Caps (Doxepin hcl) .... Take 1 capsule by mouth at bedtime 5)  Epipen 2-pak 0.3 Mg/0.26ml (1:1000) Devi (Epinephrine hcl (anaphylaxis)) .... Inject 1 pen subcutaneously as  directed 6)  Lasix 80 Mg Tabs (Furosemide) .... Take 1 tablet by mouth twice a day 7)  Lexapro 10 Mg Tabs (Escitalopram oxalate) .... Take 1 tablet by mouth once a day 8)  Metformin Hcl 500 Mg Tabs (Metformin hcl) .... Take 1 tablet by mouth twice a day 9)  Potassium Chloride Crys Cr 20 Meq Tbcr (Potassium chloride crys cr) .... Take 4  tabs by mouth in the am, 4 at noon, and 4 at hs 10)  Ranitidine Hcl 300 Mg Caps (Ranitidine hcl) .... Take 1 capsule by mouth once a day 11)  Zocor 10 Mg Tabs (Simvastatin) .... Take 1 tablet by mouth once a day 12)  Nexium 40 Mg Cpdr (Esomeprazole magnesium) .[name] Kitchen.. 1 tablet by mouth twice daily 13)  Calcium 500 500 Mg Tabs (Calcium carbonate) .[name] Kitchen.. 1 tablet by mouth daily 14)  Methotrexate 2.5 Mg Tabs (Methotrexate sodium) .... Take as directed by dr. Charlestine Night 12.5 mg (5 pills) on saturday and sunday 15)  Lyrica 100 Mg Caps (Pregabalin) .[name] Kitchen.. 1 tab by mouth three times a day 16)  Lantus 100 Unit/ml Soln (Insulin glargine) .... 60 units in the am and 60 units in the pm. dispense one month supply. 17)  Warfarin Sodium 5 Mg Tabs (Warfarin sodium) .... Take as directed 18)  Folic Acid 1 Mg Tabs (Folic acid) .[name] Kitchen.. 1 tablet by mouth daily while on methotrexate 19)  Clonidine Hcl 0.2 Mg Tabs (Clonidine hcl) .... 2 by mouth at bedtime for depression 20)  Doxepin Hcl 25 Mg Caps (Doxepin hcl) .... Take 1 capsule by mouth once a day at bedtime 21)  Iron 325 (65  Fe) Mg Tabs (Ferrous sulfate) .... One by mouth three times a day 22)  Novolog 100 Unit/ml Soln (Insulin aspart) .[name] Kitchen.. 15 units subcutaneous injection  prior to breakfast 20 units with lunch and 20 units with eveing meal.  disp: 1 month supply. 23)  Vitamin D 1000 Unit Tabs (Cholecalciferol) .... Take 1 tabs daily 24)  Bd Insulin Syringe Ultrafine 30g X 1/2" 1 Ml Misc (Insulin syringe-needle u-100) .... Dispense supply for four times per day dosing of insulin. 25)  Hydrocodone-acetaminophen 5-325 Mg Tabs  (Hydrocodone-acetaminophen) .[name] Kitchen.. 1 by mouth up to 4 times per day as needed for pain 26)  Ventolin Hfa 108 (90 Base) Mcg/act Aers (Albuterol sulfate) .... Take one puff q4 hours. 27)  Spironolactone 100 Mg Tabs (Spironolactone) .... Once daily in am 28)  Prednisone 5 Mg Tabs (Prednisone) .... Once daily 29)  Bethanechol Chloride 5 Mg Tabs (Bethanechol chloride) .... One by mouth two times a day 30)  Diflucan 100 Mg Tabs (Fluconazole) .... One by mouth x 1 31)  Nystatin 100000 Unit/gm Powd (Nystatin) .... Apply to afected area two times a day as needed rash. for groin and pannus. 32)  Albuterol Sulfate (5 Mg/ml) 0.5% Nebu (Albuterol sulfate) .[name] Kitchen.. 1 treatment inhaled every 4 hours as needed wheezing/breathing tightness; dispense one box 33)  Fluticasone Propionate 50 Mcg/act Susp (Fluticasone propionate) .... 2 sprays in each nostril daily for allergies 34)  Cetirizine Hcl 10 Mg Tabs (Cetirizine hcl) .[name] Kitchen.. 1 tab by mouth daily during allergy season  Other Orders: INR/PT-[facility] [id number])  Patient Instructions: 1)  It was nice to see you today. 2)  For your asthma/breathing,we will help get you a nebulizer machine.  Take a treatment every 4 hours as needed for wheezing/tight breathing. 3)  For your allergies, start taking cetirizine every day and start taking your flonase every day. 4)  Please schedule a follow-up appointment on Monday, or sooner if you are getting worse.   Prescriptions: CETIRIZINE HCL 10 MG TABS (CETIRIZINE HCL) 1 tab by mouth daily during allergy season  #30 x 6   Entered and Authorized by:   Carin Hock MD   Signed by:   Carin Hock MD on 12/17/2009   Method used:   Electronically to        [facility] [phone]* (retail)       [place], [place]  [address]       Ph: [phone]       Fax: [phone]   RxID:   [id number] FLUTICASONE PROPIONATE 50 MCG/ACT SUSP (FLUTICASONE PROPIONATE) 2 sprays in each nostril daily for allergies  #1 x  11   Entered and Authorized by:   Carin Hock MD   Signed by:   Carin Hock MD on 12/17/2009   Method used:   Electronically to        [facility] [phone]* (retail)       [place], [place]  [address]       Ph: [phone]       Fax: [phone]   RxID:   [id number] ALBUTEROL SULFATE (5 MG/ML) 0.5% NEBU (ALBUTEROL SULFATE) 1 treatment inhaled every 4 hours as needed wheezing/breathing tightness; dispense one box  #1 x 3   Entered and Authorized by:   Carin Hock MD   Signed by:   Carin Hock MD on 12/17/2009   Method used:   Electronically to  [facility] [phone]* (retail)       [place], [place]  [address]       Ph: [phone]       Fax: [phone]   RxID:   [id number] QVAR 40 MCG/ACT AERS (BECLOMETHASONE DIPROPIONATE) 1 puff inhaled two times a day every day for asthma (prevention)  #1 x 3   Entered and Authorized by:   Carin Hock MD   Signed by:   Carin Hock MD on 12/17/2009   Method used:   Electronically to        [facility] [phone]* (retail)       [place], [place]  [address]       Ph: [phone]       Fax: [phone]   RxID:   [id number]    ANTICOAGULATION RECORD PREVIOUS REGIMEN & LAB RESULTS Anticoagulation Diagnosis:  PE on  11/22/2008 Previous INR Goal Range:  2-3 on  11/22/2008 Previous INR:  2.1 on  11/20/2009 Previous Coumadin Dose(mg):  5mg  tablets on  11/25/2008 Previous Regimen:  continue:  5 mg - M, W, F, Sat;   2.5 mg - other days on  11/20/2009 Previous Coagulation Comments:  pt was in hospital this week - discharged yesterday on  09/19/2009  NEW REGIMEN & LAB RESULTS Current INR: 2.4 Regimen: continue same:  5 mg -  M, W, F, Sat;  2.5 mg - other days  Provider: Juleen China Repeat testing in: 4 weeks  01-14-10 Other Comments: ...............test performed by......[name] KitchenBonnie A. Martinique, MLS (ASCP)cm   Dose has been  reviewed with patient or caretaker during this visit. Reviewed by: Unk Lightning, MLS ASCP)cm  Anticoagulation Visit Questionnaire Coumadin dose missed/changed:  No Abnormal Bleeding Symptoms:  No  Any diet changes including alcohol intake, vegetables or greens since the last visit:  No Any illnesses or hospitalizations since the last visit:  No Any signs of clotting since the last visit (including chest discomfort, dizziness, shortness of breath, arm tingling, slurred speech, swelling or redness in leg):  No  MEDICATIONS ADVAIR DISKUS 500-50 MCG/DOSE MISC (FLUTICASONE-SALMETEROL) 1 puff inh two times a day ALLOPURINOL 300 MG TABS (ALLOPURINOL) Take 1  tablet by mouth twice a day Per Dr Jolee Ewing [facility] TEST  STRP (GLUCOSE BLOOD) test blood sugarfour times daily DOXEPIN HCL 100 MG CAPS (DOXEPIN HCL) Take 1 capsule by mouth at bedtime EPIPEN 2-PAK 0.3 MG/0.3ML (1:1000) DEVI (EPINEPHRINE HCL (ANAPHYLAXIS)) Inject 1 pen subcutaneously as directed LASIX 80 MG TABS (FUROSEMIDE) Take 1 tablet by mouth twice a day LEXAPRO 10 MG TABS (ESCITALOPRAM OXALATE) Take 1 tablet by mouth once a day METFORMIN HCL 500 MG TABS (METFORMIN HCL) Take 1 tablet by mouth twice a day POTASSIUM CHLORIDE CRYS CR 20 MEQ TBCR (POTASSIUM CHLORIDE CRYS CR) take 4  tabs by mouth in the am, 4 at noon, and 4 at hs RANITIDINE HCL 300 MG CAPS (RANITIDINE HCL) Take 1 capsule by mouth once a day ZOCOR 10 MG TABS (SIMVASTATIN) Take 1 tablet by mouth once a day NEXIUM 40 MG  CPDR (ESOMEPRAZOLE MAGNESIUM) 1 tablet by mouth twice daily CALCIUM 500 500 MG  TABS (CALCIUM CARBONATE) 1 tablet by mouth daily METHOTREXATE 2.5 MG  TABS (METHOTREXATE SODIUM) take as directed by Dr. Charlestine Night 12.5 mg (5 pills) on Saturday and Sunday LYRICA 100 MG  CAPS (PREGABALIN) 1 tab by mouth three times a day LANTUS 100 UNIT/ML  SOLN (INSULIN GLARGINE) 60 units in the am and 60 units in the pm. Dispense one month supply. WARFARIN SODIUM 5 MG TABS  (WARFARIN SODIUM) take as directed FOLIC ACID 1 MG TABS (FOLIC ACID) 1 tablet by mouth daily while on methotrexate CLONIDINE HCL 0.2 MG TABS (CLONIDINE HCL) 2 by mouth at bedtime for depression DOXEPIN HCL 25 MG CAPS (DOXEPIN HCL) Take 1 capsule by mouth once a day at bedtime IRON 325 (65 FE) MG TABS (FERROUS SULFATE) one by mouth three times a day NOVOLOG 100 UNIT/ML SOLN (INSULIN ASPART) 15 units subcutaneous injection  prior to breakfast 20 units with lunch and 20 units with eveing meal.  Disp: 1 month supply. VITAMIN D 1000 UNIT TABS (CHOLECALCIFEROL) take 1 tabs daily BD INSULIN SYRINGE ULTRAFINE 30G X 1/2" 1 ML MISC (INSULIN SYRINGE-NEEDLE U-100) dispense supply for four times per day dosing of insulin. HYDROCODONE-ACETAMINOPHEN 5-325 MG TABS (HYDROCODONE-ACETAMINOPHEN) 1 by mouth up to 4 times per day as needed for pain VENTOLIN HFA 108 (90 BASE) MCG/ACT AERS (ALBUTEROL SULFATE) Take one puff Q4 hours. SPIRONOLACTONE 100 MG TABS (SPIRONOLACTONE) once daily in AM PREDNISONE 5 MG TABS (PREDNISONE) once daily BETHANECHOL CHLORIDE 5 MG TABS (BETHANECHOL CHLORIDE) one by mouth two times a day DIFLUCAN 100 MG TABS (FLUCONAZOLE) one by mouth x 1 NYSTATIN 100000 UNIT/GM POWD (NYSTATIN) apply to afected area two times a day as needed rash. for groin and pannus. ALBUTEROL SULFATE (5 MG/ML) 0.5% NEBU (ALBUTEROL SULFATE) 1 treatment inhaled every 4 hours as needed wheezing/breathing tightness; dispense one box FLUTICASONE PROPIONATE 50 MCG/ACT SUSP (FLUTICASONE PROPIONATE) 2 sprays in each nostril daily for allergies CETIRIZINE HCL 10 MG TABS (CETIRIZINE HCL) 1 tab by mouth daily during allergy season

## 2010-09-15 NOTE — Progress Notes (Signed)
Summary: triage  Phone Note Call from Patient Call back at Home Phone [phone]   Caller: daughter-[name] Summary of Call: cough/sneezing/congestion - wants to come in today Initial call taken by: Audie Clear,  December 12, 2009 8:50 AM  Follow-up for Phone Call        dtr statetes she has had a bad cough & congestion for 2 wks. work in this am at 24. knows there may be a wait Follow-up by: Elige Radon RN,  December 12, 2009 9:03 AM

## 2010-09-15 NOTE — Progress Notes (Signed)
Summary: phn msg  Phone Note Call from Patient Call back at Home Phone [phone]   Caller: Helene Kelp Summary of Call: has tried to call [facility] about her nebulizer and hasn't heard anything - pls advise Initial call taken by: Audie Clear,  Dec 22, 2009 3:26 PM  Follow-up for Phone Call        Helene Kelp has tried calling [facility] but they have not returned her calls. she does not know where to go to give them the RX . offered to give her the address but she wants nebulizer rx sent in from our office. advised will have Dr. Juleen China  do new Rx so that we can fax to [facility].  Follow-up by: Marcell Barlow RN,  Dec 22, 2009 4:46 PM    New/Updated Medications: COMPRESSOR/NEBULIZER  MISC (NEBULIZERS) per instructions Prescriptions: COMPRESSOR/NEBULIZER  MISC (NEBULIZERS) per instructions  #1 x 0   Entered and Authorized by:   Briscoe Deutscher DO   Signed by:   Briscoe Deutscher DO on 12/23/2009   Method used:   Historical   [id number] COMPRESSOR/NEBULIZER  MISC (NEBULIZERS) per instructions  #1 x 0   Entered and Authorized by:   Briscoe Deutscher DO   Signed by:   Briscoe Deutscher DO on 12/23/2009   Method used:   Electronically to        [facility] [phone]* (retail)       [place], [place]  [address]       Ph: [phone]       Fax: [phone]   RxID:   [id number]  Helene Kelp notified. Marcell Barlow RN  Dec 23, 2009 12:32 PM

## 2010-09-15 NOTE — Consult Note (Signed)
Summary: [facility]  [place] [facility]   Imported By: Samara Snide 12/05/2007 10:15:13  _____________________________________________________________________  External Attachment:    Type:   Image     Comment:   External Document

## 2010-09-15 NOTE — Miscellaneous (Signed)
Summary: pa obtained for diovan  Clinical Lists Changes rec'd prior authorization for diovan good thru 1 year. faxed to pharmacy.Gay Filler CALDWELL RN  April 30, 2008 10:45 AM

## 2010-09-15 NOTE — Assessment & Plan Note (Signed)
Summary: hfu,df   Vital Signs:  Patient profile:   60 year old female Height:      61 inches Weight:      215.06 pounds BMI:     40.78 BSA:     1.95 Temp:     98.0 degrees F Pulse rate:   88 / minute BP sitting:   107 / 74  Vitals Entered By: Christen Bame CMA (May 25, 2010 10:26 AM) CC: HFU - hip pain, DM Is Patient Diabetic? Yes Did you bring your meter with you today? No Pain Assessment Patient in pain? yes     Location: feet Intensity: 8   Primary Care Provider:  Briscoe Deutscher DO  CC:  HFU - hip pain and DM.  History of Present Illness: 60 yo F:  1. Left Hip Pain: Improved. Now able to walk around. No PT.   2. DM: Lantus 60/60. Novolog 15/20/20 but many days misses the lunch dose. Low = 138. High = 477. No real pattern. Also on Metformin and Glipizide.   Habits & Providers  Alcohol-Tobacco-Diet     Tobacco Status: never     Year Quit: 1973  Current Medications (verified): 1)  Advair Diskus 500-50 Mcg/dose Misc (Fluticasone-Salmeterol) .[name] Kitchen.. 1 Puff Inh Two Times A Day 2)  Allopurinol 300 Mg Tabs (Allopurinol) .... Take 1  Tablet By Mouth Twice A Day Per Dr Jolee Ewing 3)  Ascensia Elite Test  Strp (Glucose Blood) .... Test Blood Sugarfour Times Daily 4)  Doxepin Hcl 100 Mg Caps (Doxepin Hcl) .... Take 1 Capsule By Mouth At Bedtime 5)  Epipen 2-Pak 0.3 Mg/0.59ml (1:1000) Devi (Epinephrine Hcl (Anaphylaxis)) .... Inject 1 Pen Subcutaneously As Directed 6)  Lasix 80 Mg Tabs (Furosemide) .... Take 1 Tablet By Mouth Twice A Day 7)  Lexapro 10 Mg Tabs (Escitalopram Oxalate) .... Take 1 Tablet By Mouth Once A Day 8)  Metformin Hcl 500 Mg Tabs (Metformin Hcl) .... Take 1 Tablet By Mouth Twice A Day 9)  Potassium Chloride Crys Cr 20 Meq Tbcr (Potassium Chloride Crys Cr) .... Take 4  Tabs By Mouth in The Am, 4 At Noon, and 4 At [facility] 10)  Ranitidine Hcl 300 Mg Caps (Ranitidine Hcl) .... Take 1 Capsule By Mouth Once A Day 11)  Zocor 10 Mg Tabs (Simvastatin) .... Take 1  Tablet By Mouth Once A Day 12)  Nexium 40 Mg  Cpdr (Esomeprazole Magnesium) .[name] Kitchen.. 1 Tablet By Mouth Twice Daily 13)  Calcium 500 500 Mg  Tabs (Calcium Carbonate) .[name] Kitchen.. 1 Tablet By Mouth Daily 14)  Methotrexate 2.5 Mg  Tabs (Methotrexate Sodium) .... Take As Directed By Dr. Charlestine Night 12.5 Mg (5 Pills) On Saturday and Sunday 15)  Lyrica 100 Mg  Caps (Pregabalin) .[name] Kitchen.. 1 Tab By Mouth Three Times A Day 16)  Lantus 100 Unit/ml Soln (Insulin Glargine) .... 60 Units in The Am and 60 Units in The Pm. Dispense One Month Supply. 17)  Warfarin Sodium 5 Mg Tabs (Warfarin Sodium) .... Take As Directed 18)  Folic Acid 1 Mg Tabs (Folic Acid) .[name] Kitchen.. 1 Tablet By Mouth Daily While On Methotrexate 19)  Clonidine Hcl 0.2 Mg Tabs (Clonidine Hcl) .... 2 By Mouth At Bedtime For Depression 20)  Doxepin Hcl 25 Mg Caps (Doxepin Hcl) .... Take 1 Capsule By Mouth Once A Day At Bedtime 21)  Iron 325 (65 Fe) Mg Tabs (Ferrous Sulfate) .... One By Mouth Three Times A Day 22)  Novolog 100 Unit/ml Soln (Insulin Aspart) .[name] Kitchen.. 15 Units Subcutaneous  Injection  Prior To Breakfast 20 Units With Lunch and 20 Units With Eveing Meal.  Disp: 1 Month Supply. 23)  Vitamin D 1000 Unit Tabs (Cholecalciferol) .... Take 1 Tabs Daily 24)  Bd Insulin Syringe Ultrafine 30g X 1/2" 1 Ml Misc (Insulin Syringe-Needle U-100) .... Dispense Supply For Four Times Per Day Dosing of Insulin. 25)  Hydrocodone-Acetaminophen 5-325 Mg Tabs (Hydrocodone-Acetaminophen) .[name] Kitchen.. 1 By Mouth Up To 4 Times Per Day As Needed For Pain 26)  Ventolin Hfa 108 (90 Base) Mcg/act Aers (Albuterol Sulfate) .... Take One Puff Q4 Hours. 27)  Spironolactone 100 Mg Tabs (Spironolactone) .... Once Daily in Am 28)  Prednisone 5 Mg Tabs (Prednisone) .... Once Daily 29)  Bethanechol Chloride 5 Mg Tabs (Bethanechol Chloride) .... One By Mouth Two Times A Day 30)  Diflucan 100 Mg Tabs (Fluconazole) .... One By Mouth X 1 31)  Nystatin 100000 Unit/gm Powd (Nystatin) .... Apply To Afected Area Two  Times A Day As Needed Rash. For Groin and Pannus. 32)  Albuterol Sulfate (5 Mg/ml) 0.5% Nebu (Albuterol Sulfate) .[name] Kitchen.. 1 Treatment Inhaled Every 4 Hours As Needed Wheezing/breathing Tightness; Dispense One Box 33)  Fluticasone Propionate 50 Mcg/act Susp (Fluticasone Propionate) .... 2 Sprays in Each Nostril Daily For Allergies 34)  Cetirizine Hcl 10 Mg Tabs (Cetirizine Hcl) .[name] Kitchen.. 1 Tab By Mouth Daily During Allergy Season 35)  Compressor/nebulizer  Misc (Nebulizers) .... Per Instructions  Allergies (verified): 1)  ! Codeine 2)  ! Penicillin 3)  ! Sulfa 4)  ! * Lithium 5)  ! Valium 6)  ! * Cogentin 7)  ! Lisinopril (Lisinopril) 8)  ! * Hyddal 9)  ! Diphenhydramine Hcl (Diphenhydramine Hcl) 10)  Thioridazine Hcl (Thioridazine Hcl) PMH-FH-SH reviewed for relevance  Review of Systems General:  Denies chills and fever. CV:  Denies chest pain or discomfort, palpitations, and shortness of breath with exertion. GI:  Denies change in bowel habits. MS:  Denies joint pain. Derm:  Denies rash.  Physical Exam  General:  Well-developed,well-nourished, in no acute distress; alert, appropriate and cooperative throughout examination. Vitals reviewed. Lungs:  Normal respiratory effort, chest expands symmetrically. Lungs are clear to auscultation, no crackles or wheezes. Heart:  Normal rate and regular rhythm. S1 and S2 normal without gallop, murmur, click, rub or other extra sounds. Msk:  Normal ROM of bilateral hips. Neg SLR.  Pulses:  2 + DP. Extremities:  Trace edema bilateral, venous stasis hyperpigementation in lower extremities and feet.   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  Orders: [place]- Est  Level 4 [id number])  Problem # 2:  DIABETES MELLITUS, TYPE I, UNCONTROLLED (ICD-250.03) Assessment: Unchanged Discussed more aggressive titration of medication. See patient instructions. Her  updated medication list for this problem includes:    Metformin Hcl 500 Mg Tabs (Metformin hcl) .[name] Kitchen... Take 1 tablet by mouth twice a day    Lantus 100 Unit/ml Soln (Insulin glargine) .[name] KitchenMarland KitchenMarland KitchenMarland Kitchen 60 units in the am and 60 units in the pm. dispense one month supply.    Novolog 100 Unit/ml Soln (Insulin aspart) .[name] KitchenMarland KitchenMarland KitchenMarland Kitchen 15 units subcutaneous injection  prior to breakfast 20 units with lunch and 20 units with eveing meal.  disp: 1 month supply.  Orders: [facility]1C-[facility] [id number]) [place]- Est  Level 4 [id number])  Complete Medication List: 1)  Advair Diskus 500-50 Mcg/dose Misc (Fluticasone-salmeterol) .[name] Kitchen.. 1 puff inh two times a day 2)  Allopurinol 300 Mg Tabs (Allopurinol) .... Take 1  tablet by mouth twice a day per dr trueslow 3)  Ascensia Elite Test Strp (Glucose blood) .... Test blood sugarfour times daily 4)  Doxepin Hcl 100 Mg Caps (Doxepin hcl) .... Take 1 capsule by mouth at bedtime 5)  Epipen 2-pak 0.3 Mg/0.55ml (1:1000) Devi (Epinephrine hcl (anaphylaxis)) .... Inject 1 pen subcutaneously as directed 6)  Lasix 80 Mg Tabs (Furosemide) .... Take 1 tablet by mouth twice a day 7)  Lexapro 10 Mg Tabs (Escitalopram oxalate) .... Take 1 tablet by mouth once a day 8)  Metformin Hcl 500 Mg Tabs (Metformin hcl) .... Take 1 tablet by mouth twice a day 9)  Potassium Chloride Crys Cr 20 Meq Tbcr (Potassium chloride crys cr) .... Take 4  tabs by mouth in the am, 4 at noon, and 4 at hs 10)  Ranitidine Hcl 300 Mg Caps (Ranitidine hcl) .... Take 1 capsule by mouth once a day 11)  Zocor 10 Mg Tabs (Simvastatin) .... Take 1 tablet by mouth once a day 12)  Nexium 40 Mg Cpdr (Esomeprazole magnesium) .[name] Kitchen.. 1 tablet by mouth twice daily 13)  Calcium 500 500 Mg Tabs (Calcium carbonate) .[name] Kitchen.. 1 tablet by mouth daily 14)  Methotrexate 2.5 Mg Tabs (Methotrexate sodium) .... Take as directed by dr. Charlestine Night 12.5 mg (5 pills) on saturday and sunday 15)  Lyrica 100 Mg Caps (Pregabalin) .[name] Kitchen.. 1 tab by mouth three times a day 16)  Lantus 100  Unit/ml Soln (Insulin glargine) .... 60 units in the am and 60 units in the pm. dispense one month supply. 17)  Warfarin Sodium 5 Mg Tabs (Warfarin sodium) .... Take as directed 18)  Folic Acid 1 Mg Tabs (Folic acid) .[name] Kitchen.. 1 tablet by mouth daily while on methotrexate 19)  Clonidine Hcl 0.2 Mg Tabs (Clonidine hcl) .... 2 by mouth at bedtime for depression 20)  Doxepin Hcl 25 Mg Caps (Doxepin hcl) .... Take 1 capsule by mouth once a day at bedtime 21)  Iron 325 (65 Fe) Mg Tabs (Ferrous sulfate) .... One by mouth three times a day 22)  Novolog 100 Unit/ml Soln (Insulin aspart) .[name] Kitchen.. 15 units subcutaneous injection  prior to breakfast 20 units with lunch and 20 units with eveing meal.  disp: 1 month supply. 23)  Vitamin D 1000 Unit Tabs (Cholecalciferol) .... Take 1 tabs daily 24)  Bd Insulin Syringe Ultrafine 30g X 1/2" 1 Ml Misc (Insulin syringe-needle u-100) .... Dispense supply for four times per day dosing of insulin. 25)  Hydrocodone-acetaminophen 5-325 Mg Tabs (Hydrocodone-acetaminophen) .[name] Kitchen.. 1 by mouth up to 4 times per day as needed for pain 26)  Ventolin Hfa 108 (90 Base) Mcg/act Aers (Albuterol sulfate) .... Take one puff q4 hours. 27)  Spironolactone 100 Mg Tabs (Spironolactone) .... Once daily in am 28)  Prednisone 5 Mg Tabs (Prednisone) .... Once daily 29)  Bethanechol Chloride 5 Mg Tabs (Bethanechol chloride) .... One by mouth two times a day 30)  Diflucan 100 Mg Tabs (Fluconazole) .... One by mouth x 1 31)  Nystatin 100000 Unit/gm Powd (Nystatin) .... Apply to afected area two times a day as needed rash. for groin and pannus. 32)  Albuterol Sulfate (5 Mg/ml) 0.5% Nebu (Albuterol sulfate) .[name] Kitchen.. 1 treatment inhaled every 4 hours as needed wheezing/breathing tightness; dispense one box 33)  Fluticasone Propionate 50 Mcg/act Susp (Fluticasone propionate) .... 2 sprays in each nostril daily for allergies 34)  Cetirizine Hcl 10 Mg Tabs (Cetirizine hcl) .[name] Kitchen.. 1 tab by mouth daily during allergy  season 35)  Compressor/nebulizer Misc (Nebulizers) .... Per instructions  Other Orders: INR/PT-[facility] [id number]) Future Orders: Lipid-[facility] [id number]) ... 06/04/2011  Patient Instructions: 1)  It was nice to see you today! 2)  Increase your mealtime insulin to 20 units in the am, 25 units at lunch, 25 units at dinner. 3)  Increase your Lantus by 1 unit every day as long as your am blood sugar is more than 120. When you get to 70 units in the am, start increasing your pm Lantus. 4)  When you get to Lantus 70 in the am and 70 in the pm, please send me a report of your blood sugars for me to review.  Prevention & Chronic Care Immunizations   Influenza vaccine: Fluvax 3+  (05/21/2009)   Influenza vaccine due: 06/19/2008    Tetanus booster: 08/16/2001: Done.   Tetanus booster due: 08/17/2011    Pneumococcal vaccine: Done.  (09/16/2004)   Pneumococcal vaccine due: None  Colorectal Screening   Hemoccult: Done.  (12/14/2000)   Hemoccult due: Not Indicated    Colonoscopy: Done.  (03/16/2006)   Colonoscopy due: 03/16/2016  Other Screening   Pap smear: Done.  (08/16/2006)   Pap smear action/deferral: Deferred  (05/21/2009)   Pap smear due: 08/16/2009    Mammogram: ASSESSMENT: Negative - BI-RADS 1^MM DIGITAL SCREENING  (03/25/2009)   Mammogram action/deferral: Not indicated  (09/11/2009)   Mammogram due: 03/25/2010   Smoking status: never  (05/25/2010)  Diabetes Mellitus   HgbA1C: 10.6  (05/25/2010)   Hemoglobin A1C due: 09/30/2007    Eye exam: Not documented    Foot exam: yes  (10/20/2009)   Foot exam action/deferral: Do today   High risk foot: Yes  (09/11/2009)   Foot care education: Done  (09/11/2009)   Foot exam due: 05/03/2008    Urine microalbumin/creatinine ratio: Not documented    Diabetes flowsheet reviewed?: Yes   Progress toward A1C goal: Unchanged  Lipids   Total Cholesterol: 169  (06/30/2009)   LDL: 49  (06/30/2009)   LDL Direct: 96  (02/28/2008)   HDL: 43   (06/30/2009)   Triglycerides: 384  (06/30/2009)    SGOT (AST): 19  (05/21/2009)   SGPT (ALT): 39  (05/21/2009)   Alkaline phosphatase: 99  (05/21/2009)   Total bilirubin: 0.5  (05/21/2009)    Lipid flowsheet reviewed?: Yes   Progress toward LDL goal: At goal  Hypertension   Last Blood Pressure: 107 / 74  (05/25/2010)   Serum creatinine: 1.03  (09/24/2009)   Serum potassium 4.2  (09/24/2009)    Hypertension flowsheet reviewed?: Yes   Progress toward BP goal: At goal  Self-Management Support :   Personal Goals (by the next clinic visit) :     Personal A1C goal: 8  (07/13/2009)     Personal blood pressure goal: 130/80  (05/21/2009)     Personal LDL goal: 100  (05/21/2009)    Patient will work on the following items until the next clinic visit to reach self-care goals:     Medications and monitoring: take my medicines every day, bring all of my medications to every visit  (05/25/2010)     Eating: drink diet soda or water instead of juice or soda, eat more vegetables, use fresh or frozen vegetables, eat foods that are low in salt, eat baked foods instead of fried foods, eat fruit for snacks and desserts, limit or avoid alcohol  (05/25/2010)     Activity: take a 30 minute walk every day, take the stairs instead of the elevator, park  at the far end of the parking lot  (05/25/2010)    Diabetes self-management support: CBG self-monitoring log, Written self-care plan  (05/25/2010)   Diabetes care plan printed    Hypertension self-management support: Written self-care plan  (05/25/2010)   Hypertension self-care plan printed.    Hypertension self-management support not done because: Good outcomes  (09/23/2009)    Lipid self-management support: Written self-care plan  (05/25/2010)   Lipid self-care plan printed.    Lipid self-management support not done because: Good outcomes  (09/23/2009)   Nursing Instructions: Give Flu vaccine today     ANTICOAGULATION RECORD PREVIOUS  REGIMEN & LAB RESULTS Anticoagulation Diagnosis:  PE on  11/22/2008 Previous INR Goal Range:  2-3 on  11/22/2008 Previous INR:  2.1 on  04/14/2010 Previous Coumadin Dose(mg):  5mg  tablets on  11/25/2008 Previous Regimen:  continue same:  5 mg - M, W, F, Sat;   2.5 mg - Sun, Tues, Thurs on  04/14/2010 Previous Coagulation Comments:  pt was in hospital this week - discharged yesterday on  09/19/2009  NEW REGIMEN & LAB RESULTS Current INR: 2.5 Regimen: continue:  5 mg - M, W, F, Sat;   2.5 mg - other days  Provider: Juleen China Repeat testing in: 4 weeks  06-22-10 Other Comments: ...............test performed by......[name] KitchenBonnie A. Martinique, MLS (ASCP)cm   Dose has been reviewed with patient or caretaker during this visit. Reviewed by: Dr. Juleen China  Anticoagulation Visit Questionnaire Coumadin dose missed/changed:  No Coumadin Dose Comments:  discharge note has warfarin dose of 5 mg - M, W, F;  and 2.5 mg - other days,  patient has been following the schedule/calendar from before admission,  will continue her peradmission dose schedule Abnormal Bleeding Symptoms:  No  Any diet changes including alcohol intake, vegetables or greens since the last visit:  No Any illnesses or hospitalizations since the last visit:  Yes      Recent Illness/Hospitalizations:  see admission and discharge summary Any signs of clotting since the last visit (including chest discomfort, dizziness, shortness of breath, arm tingling, slurred speech, swelling or redness in leg):  No  MEDICATIONS ADVAIR DISKUS 500-50 MCG/DOSE MISC (FLUTICASONE-SALMETEROL) 1 puff inh two times a day ALLOPURINOL 300 MG TABS (ALLOPURINOL) Take 1  tablet by mouth twice a day Per Dr Jolee Ewing [facility] TEST  STRP (GLUCOSE BLOOD) test blood sugarfour times daily DOXEPIN HCL 100 MG CAPS (DOXEPIN HCL) Take 1 capsule by mouth at bedtime EPIPEN 2-PAK 0.3 MG/0.3ML (1:1000) DEVI (EPINEPHRINE HCL (ANAPHYLAXIS)) Inject 1 pen subcutaneously as  directed LASIX 80 MG TABS (FUROSEMIDE) Take 1 tablet by mouth twice a day LEXAPRO 10 MG TABS (ESCITALOPRAM OXALATE) Take 1 tablet by mouth once a day METFORMIN HCL 500 MG TABS (METFORMIN HCL) Take 1 tablet by mouth twice a day POTASSIUM CHLORIDE CRYS CR 20 MEQ TBCR (POTASSIUM CHLORIDE CRYS CR) take 4  tabs by mouth in the am, 4 at noon, and 4 at hs RANITIDINE HCL 300 MG CAPS (RANITIDINE HCL) Take 1 capsule by mouth once a day ZOCOR 10 MG TABS (SIMVASTATIN) Take 1 tablet by mouth once a day NEXIUM 40 MG  CPDR (ESOMEPRAZOLE MAGNESIUM) 1 tablet by mouth twice daily CALCIUM 500 500 MG  TABS (CALCIUM CARBONATE) 1 tablet by mouth daily METHOTREXATE 2.5 MG  TABS (METHOTREXATE SODIUM) take as directed by Dr. Charlestine Night 12.5 mg (5 pills) on Saturday and Sunday LYRICA 100 MG  CAPS (PREGABALIN) 1 tab by mouth three times a day LANTUS 100 UNIT/ML SOLN (INSULIN  GLARGINE) 60 units in the am and 60 units in the pm. Dispense one month supply. WARFARIN SODIUM 5 MG TABS (WARFARIN SODIUM) take as directed FOLIC ACID 1 MG TABS (FOLIC ACID) 1 tablet by mouth daily while on methotrexate CLONIDINE HCL 0.2 MG TABS (CLONIDINE HCL) 2 by mouth at bedtime for depression DOXEPIN HCL 25 MG CAPS (DOXEPIN HCL) Take 1 capsule by mouth once a day at bedtime IRON 325 (65 FE) MG TABS (FERROUS SULFATE) one by mouth three times a day NOVOLOG 100 UNIT/ML SOLN (INSULIN ASPART) 15 units subcutaneous injection  prior to breakfast 20 units with lunch and 20 units with eveing meal.  Disp: 1 month supply. VITAMIN D 1000 UNIT TABS (CHOLECALCIFEROL) take 1 tabs daily BD INSULIN SYRINGE ULTRAFINE 30G X 1/2" 1 ML MISC (INSULIN SYRINGE-NEEDLE U-100) dispense supply for four times per day dosing of insulin. HYDROCODONE-ACETAMINOPHEN 5-325 MG TABS (HYDROCODONE-ACETAMINOPHEN) 1 by mouth up to 4 times per day as needed for pain VENTOLIN HFA 108 (90 BASE) MCG/ACT AERS (ALBUTEROL SULFATE) Take one puff Q4 hours. SPIRONOLACTONE 100 MG TABS  (SPIRONOLACTONE) once daily in AM PREDNISONE 5 MG TABS (PREDNISONE) once daily BETHANECHOL CHLORIDE 5 MG TABS (BETHANECHOL CHLORIDE) one by mouth two times a day DIFLUCAN 100 MG TABS (FLUCONAZOLE) one by mouth x 1 NYSTATIN 100000 UNIT/GM POWD (NYSTATIN) apply to afected area two times a day as needed rash. for groin and pannus. ALBUTEROL SULFATE (5 MG/ML) 0.5% NEBU (ALBUTEROL SULFATE) 1 treatment inhaled every 4 hours as needed wheezing/breathing tightness; dispense one box FLUTICASONE PROPIONATE 50 MCG/ACT SUSP (FLUTICASONE PROPIONATE) 2 sprays in each nostril daily for allergies CETIRIZINE HCL 10 MG TABS (CETIRIZINE HCL) 1 tab by mouth daily during allergy season COMPRESSOR/NEBULIZER  MISC (NEBULIZERS) per instructions    Laboratory Results   Blood Tests   Date/Time Received: May 25, 2010 10:57 AM  Date/Time Reported: May 25, 2010 11:27 AM   HGBA1C: 10.6%   (Normal Range: Non-Diabetic - 3-6%   Control Diabetic - 6-8%)  INR: 2.5   (Normal Range: 0.88-1.12   Therap INR: 2.0-3.5) Comments: ...............test performed by......[name] KitchenBonnie A. Martinique, MLS (ASCP)cm

## 2010-09-15 NOTE — Assessment & Plan Note (Signed)
Summary: Dm2, breast mass   Vital Signs:  Patient Profile:   60 Years Old Female Height:     61 inches Weight:      210 pounds Temp:     98.2 degrees F oral Pulse rate:   114 / minute BP sitting:   139 / 88  (left arm)  Vitals Entered By: Carolyne Littles (July 04, 2008 2:40 PM)             Is Patient Diabetic? Yes     PCP:  Dixon Boos MD  Chief Complaint:  f/u.  History of Present Illness: 60 yo with:  1.  chronic leg pain- followed by Dr. Charlestine Night.  Back on prednisone now.  Was off for 7 weeks and had excruciating leg pain.  pain is better now, but she is still having swelling in b/l legs.  Unable to tolerate compression stockings b/c they cause the fluid to accumulate proximal to the stockings.    2.  DM2- not checking sugars recently.  Has checked them twice and they were 300-400.  Taking glipizide and metformin as prescribed.  Pt's neice reports Brittiney has not been followin diet recently.  Will eat 6 mini candy bars at a time.  A1C has historically been at goal in the past.  + excruciating burning in b/l feet, better since starting lyrica.  3.  breast mass- had screening mammogram 1/09.  At the time she had a sebaceous cyst in her breast that decompressed during the mammogram procedure and drained the contents. F/U mamogram was recommended in 1 year.   Pt now has similar superficial tender mass in RUQ of right breast.  Feels similar to prior sebaceous cyst.      Past Medical History:    amenorrhea since 60 years old    Barrets Esophagus - EGD 8/07. (Repeat 2-3 years)    CA-125 - normal 11/28/2000    CBC wnl and Neg Rheum w/u and neg CCP Ab (8/06)    Nephrolithiasis 2/03    stroke at age 54???    Colonoscopy- benign polyps (12/2000)     echo-5/05-poor study, grossly nL     Endometrial biopsy 10/03 benign columnar mucosa (limited material)    stress test 04/2006- negative per patient     PFT's (10/14/06)- mild obstructive airway dz, poor effort    LDL Result Date:   02/28/2008 LDL Result:  96 LDL Next Due:  1 yr Last Flex Sig:  Done. (12/14/2000 12:00:00 AM) Flex Sig Next Due:  Not Indicated Last Hemoccult Result: Done. (12/14/2000 12:00:00 AM) Hemoccult Next Due:  Not Indicated    Physical Exam  General:     NAD Mouth:     poor dentition, dry mucous membranes Breasts:     skin/areolae normal, no abnormal thickening, and no nipple discharge.  1cm x 3cm superficial mass that is mobile palpated in RUQ of right breast.  Tender to palpation of this mass. Lungs:     nml effort, CTAB Heart:     tachycardic, no murmur Extremities:     2+ pitting edema to shins b/l with chronic venous insuf changes. Neurologic:     alert & oriented X3.    Diabetes Management Exam:    Foot Exam (with socks and/or shoes not present):       Sensory-Monofilament:          Left foot: diminished          Right foot: diminished       Inspection:  Left foot: abnormal             Comments: early bunion formation          Right foot: abnormal             Comments: early bunion formation       Nails:          Left foot: thickened          Right foot: thickened    Impression & Recommendations:  Problem # 1:  BREAST MASS, RIGHT (ICD-611.72) Assessment: New Likely sebaceous cyst given h/o of same in recent past.  Pt set up for diagnostic mammogram and they will also perform ultrasound if indicated.  Last screening mammo was in 1/09.  + exposure to estrogens. Orders: Ultrasound (Ultrasound) [place]- Est  Level 4 [id number])   Problem # 2:  DIABETIC PERIPHERAL NEUROPATHY (ICD-250.60) Assessment: Unchanged lyrica maxed out.  Advised tight glucose control is crucial to prevent progression of her disease. Her updated medication list for this problem includes:    Diovan 40 Mg Tabs (Valsartan) .[name] Kitchen... Take 1 tablet by mouth once a day    Glipizide 10 Mg Tb24 (Glipizide) .[name] Kitchen... Take 1 tablet by mouth once a day    Metformin Hcl 500 Mg Tabs (Metformin hcl) .[name] Kitchen... Take 1  tablet by mouth twice a day    Tgt Aspirin 81 Mg Tbec (Aspirin) .[name] Kitchen... Take 1 tablet by mouth once a day  Orders: [place] [facility]- Est  Level 4 [id number])   Problem # 3:  VENOUS INSUFFICIENCY, CHRONIC (ICD-459.81) Assessment: Unchanged unable to tolerate compression stockings in past.  Treated with chronic diuretics.  Pt very reluctant to changing diuretic regimen.  Problem # 4:  MENOPAUSAL SYNDROME (ICD-627.2) Assessment: Unchanged discussed risks of estrogen again, including increased risk for br cancer.  Pt states she "goes crazy" when trying to stop estrogens in past. Her updated medication list for this problem includes:    Prempro 0.3-1.5 Mg Tabs (Conj estrog-medroxyprogest ace) .[name] Kitchen... Take 1 tablet by mouth once a day   Problem # 5:  DIABETES MELLITUS II, UNCOMPLICATED ([id number]) Assessment: Deteriorated CBGs 300-400 in recent past, but A1C only 7.2%.  Advised pt to start keeping track of sugars again.  Advised to cut back candy/cookies/sweets which she has been eating a lot of recently.  Pt to call clinic in 2-4 weeks with log of sugars and I advised we may have to start insulin. Her updated medication list for this problem includes:    Diovan 40 Mg Tabs (Valsartan) .[name] Kitchen... Take 1 tablet by mouth once a day    Glipizide 10 Mg Tb24 (Glipizide) .[name] Kitchen... Take 1 tablet by mouth once a day    Metformin Hcl 500 Mg Tabs (Metformin hcl) .[name] Kitchen... Take 1 tablet by mouth twice a day    Tgt Aspirin 81 Mg Tbec (Aspirin) .[name] Kitchen... Take 1 tablet by mouth once a day  Orders: A1C-[facility] [id number]) [place]- Est  Level 4 [id number])   Problem # 6:  HYPOKALEMIA (ICD-276.8) BMET results reviewed.  K is slightly low, which is a chronic problem likely secondary to multiple diuretic medications.  Pt extremely attached to her diuretic regimen and states she ends up in hospital whenever they are stopped/decreased.  Diuretics appear to be mostly for LE swelling.  Cr 1.27 on BMET.  Watch renal function and f/u BMET at next visit.  Pt not  taking and NSAIDS. Orders: Basic Met-[facility] [id number])   Problem # 7:  OBESITY, NOS (ICD-278.00) gained 12 lbs  since last visit!  Complete Medication List: 1)  Advair Diskus 250-50 Mcg/dose Misc (Fluticasone-salmeterol) .... Inhale 1 puff as directed twice a day 2)  Albuterol 90 Mcg/act Aers (Albuterol) .... Inhale 1 puff using inhaler four times a day 3)  Allopurinol 300 Mg Tabs (Allopurinol) .... Take 1 tablet by mouth twice a day 4)  Amitriptyline Hcl 25 Mg Tabs (Amitriptyline hcl) .... Take 1 tablet by mouth every night 5)  Ascensia Elite Test Strp (Glucose blood) .... Test blood sugar twice daily 6)  Clonidine Hcl 0.2 Mg Tabs (Clonidine hcl) .... Take 2 tablet by mouth every night 7)  Colchicine 0.6 Mg Tabs (Colchicine) .... Take 1 tablet by mouth twice a day 8)  Darvocet-n 100 100-650 Mg Tabs (Propoxyphene n-apap) .... Take 1 tablet by mouth every six hours- needs appointment before any more refills 9)  Diovan 40 Mg Tabs (Valsartan) .... Take 1 tablet by mouth once a day 10)  Doxepin Hcl 100 Mg Caps (Doxepin hcl) .... Take 1 capsule by mouth at bedtime 11)  Epipen 2-pak 0.3 Mg/0.4ml (1:1000) Devi (Epinephrine hcl (anaphylaxis)) .... Inject 1 pen subcutaneously as directed 12)  Glipizide 10 Mg Tb24 (Glipizide) .... Take 1 tablet by mouth once a day 13)  Hydrochlorothiazide 25 Mg Tabs (Hydrochlorothiazide) .... Take 1 tablet by mouth twice a day 14)  Lasix 80 Mg Tabs (Furosemide) .... Take 1 tablet by mouth twice a day 15)  Lexapro 10 Mg Tabs (Escitalopram oxalate) .... Take 1 tablet by mouth once a day 16)  Metformin Hcl 500 Mg Tabs (Metformin hcl) .... Take 1 tablet by mouth twice a day 17)  Potassium Chloride Crys Cr 20 Meq Tbcr (Potassium chloride crys cr) .... Take 3 tablet by mouth twice times a day and 2 tablets once daily 18)  Prempro 0.3-1.5 Mg Tabs (Conj estrog-medroxyprogest ace) .... Take 1 tablet by mouth once a day 19)  Ranitidine Hcl 300 Mg Caps (Ranitidine hcl) ....  Take 1 capsule by mouth once a day 20)  Spironolactone 50 Mg Tabs (Spironolactone) .... Take 1 tablet by mouth once a day 21)  Tgt Aspirin 81 Mg Tbec (Aspirin) .... Take 1 tablet by mouth once a day 22)  Zocor 10 Mg Tabs (Simvastatin) .... Take 1 tablet by mouth once a day 23)  Nexium 40 Mg Cpdr (Esomeprazole magnesium) .[name] Kitchen.. 1 tablet by mouth twice daily 24)  Calcium 500 500 Mg Tabs (Calcium carbonate) .[name] Kitchen.. 1 tablet by mouth tid 25)  Methotrexate 2.5 Mg Tabs (Methotrexate sodium) .... Take as directed by dr. Charlestine Night 26)  Doxepin Hcl 25 Mg Caps (Doxepin hcl) .[name] Kitchen.. 1 tablet by mouth qhs 27)  Oxybutynin Chloride 5 Mg Tabs (Oxybutynin chloride) .[name] Kitchen.. 1 tablet by mouth two times a day as directed 28)  Prednisone 5 Mg Tabs (Prednisone) .[name] Kitchen.. 1 tablet by mouth daily 29)  Lyrica 100 Mg Caps (Pregabalin) .[name] Kitchen.. 1 tab by mouth three times a day   Patient Instructions: 1)  Start checking your sugar twice a day- when you wake up and also in the early afternoon. 2)  Call me if the numbers in the morning are > 180 or in the afternoon over 200 because we MAY have to talk about starting insulin. 3)  Adina will set up your breast ultrasound. 4)  Follow-up with Dr. Ronnald Ramp in 1 month with your blood sugar log book.   ] Laboratory Results   Blood Tests   Date/Time Received: July 04, 2008 3:02 PM  Date/Time Reported: July 04, 2008 3:46 PM   HGBA1C: 7.2%   (Normal Range: Non-Diabetic - 3-6%   Control Diabetic - 6-8%)  Comments: ...............test performed by......[name] KitchenBonnie A. Martin[place], [place] (ASCP)

## 2010-09-15 NOTE — Consult Note (Signed)
Summary: [name]   Imported By: Drucie Ip 11/22/2006 14:31:25  _____________________________________________________________________  External Attachment:    Type:   Image     Comment:   External Document

## 2010-09-15 NOTE — Progress Notes (Signed)
Summary: phn msg  Phone Note Call from Patient Call back at [phone]   Caller: Neice-[name]  Summary of Call: Pt in the hospital and the neice has several things that she would like to talk to Dr. Juleen China about. Initial call taken by: Raymond Gurney,  September 17, 2009 9:24 AM  Follow-up for Phone Call        [place] and spoke with Helene Kelp. Follow-up by: Briscoe Deutscher DO,  September 18, 2009 6:45 AM

## 2010-09-15 NOTE — Letter (Signed)
Summary: Generic Letter  Press photographer, [place]  [address] [place]   [place], [place] [address]   Phone: [phone]  Fax: [phone]        September 08, 2009 MRN: [id number]    [facility] [place], [place]  [address]    Dear Ms. Aschenbrenner,  I have been trying to reach you by telephone to discuss your recent test results. The telephone number that I have for you, [phone], is temporarily disconnected. Please call me.       Sincerely,    Desiree Lucy, RN, BSN  This letter has been electronically signed by your physician.

## 2010-09-15 NOTE — Miscellaneous (Signed)
Summary: see append/message/TS  Clinical Lists Changes called pt. spoke with pt's nice Helene Kelp and explained the message from [place]. see note of append.[name] CMA,  May 23, 2009 4:08 PM

## 2010-09-15 NOTE — Assessment & Plan Note (Signed)
Summary: very high CBGs/[place]/wallace   Vital Signs:  Patient profile:   60 year old female Height:      61 inches Weight:      217.4 pounds BMI:     41.23 Temp:     97.2 degrees F oral Pulse rate:   87 / minute BP sitting:   126 / 80  (left arm) Cuff size:   regular  Vitals Entered By: Isabelle Course (November 20, 2009 11:21 AM) CC: elevated CBG 296, pinky toe hurts Is Patient Diabetic? Yes Did you bring your meter with you today? No Pain Assessment Patient in pain? no      Comments ate breakfast at 830am   Primary Care Provider:  Briscoe Deutscher [phone]  CC:  elevated CBG 296 and pinky toe hurts.  History of Present Illness: 1.  high sugars--in 200s fasting.  300s before lunch.  400s before dinner.  on lantus 55 two times a day and novolog meal covg 15 three times a day.  in the last month, 165 is the lowest.    usually checks three times a day   eats small breakfast, sandwich or leftovers for lunch, two times a day dinner.    on chronic prednisone for gout, fibromyalgia, arthritis.  2.  right 5th toe pain--hurting past few days.  does not remember injury, but has little feeling in her toe because of diabetic neuropathy  Habits & Providers  Alcohol-Tobacco-Diet     Tobacco Status: never  Current Medications (verified): 1)  Advair Diskus 500-50 Mcg/dose Misc (Fluticasone-Salmeterol) .[name] Kitchen.. 1 Puff Inh Two Times A Day 2)  Allopurinol 300 Mg Tabs (Allopurinol) .... Take 1  Tablet By Mouth Twice A Day Per Dr Jolee Ewing 3)  Ascensia Elite Test  Strp (Glucose Blood) .... Test Blood Sugarfour Times Daily 4)  Doxepin Hcl 100 Mg Caps (Doxepin Hcl) .... Take 1 Capsule By Mouth At Bedtime 5)  Epipen 2-Pak 0.3 Mg/0.67ml (1:1000) Devi (Epinephrine Hcl (Anaphylaxis)) .... Inject 1 Pen Subcutaneously As Directed 6)  Lasix 80 Mg Tabs (Furosemide) .... Take 1 Tablet By Mouth Twice A Day 7)  Lexapro 10 Mg Tabs (Escitalopram Oxalate) .... Take 1 Tablet By Mouth Once A Day 8)  Metformin Hcl  500 Mg Tabs (Metformin Hcl) .... Take 1 Tablet By Mouth Twice A Day 9)  Potassium Chloride Crys Cr 20 Meq Tbcr (Potassium Chloride Crys Cr) .... Take 4  Tabs By Mouth in The Am, 4 At Noon, and 4 At [facility] 10)  Ranitidine Hcl 300 Mg Caps (Ranitidine Hcl) .... Take 1 Capsule By Mouth Once A Day 11)  Zocor 10 Mg Tabs (Simvastatin) .... Take 1 Tablet By Mouth Once A Day 12)  Nexium 40 Mg  Cpdr (Esomeprazole Magnesium) .[name] Kitchen.. 1 Tablet By Mouth Twice Daily 13)  Calcium 500 500 Mg  Tabs (Calcium Carbonate) .[name] Kitchen.. 1 Tablet By Mouth Daily 14)  Methotrexate 2.5 Mg  Tabs (Methotrexate Sodium) .... Take As Directed By Dr. Charlestine Night 12.5 Mg (5 Pills) On Saturday and Sunday 15)  Lyrica 100 Mg  Caps (Pregabalin) .[name] Kitchen.. 1 Tab By Mouth Three Times A Day 16)  Lantus 100 Unit/ml Soln (Insulin Glargine) .... 30 Units in The Am and 30 Units in The Pm. Dispense One Month Supply. 17)  Warfarin Sodium 5 Mg Tabs (Warfarin Sodium) .... Take As Directed 18)  Folic Acid 1 Mg Tabs (Folic Acid) .[name] Kitchen.. 1 Tablet By Mouth Daily While On Methotrexate 19)  Clonidine Hcl 0.2 Mg Tabs (Clonidine Hcl) .... 2  By Mouth At Bedtime For Depression 20)  Doxepin Hcl 25 Mg Caps (Doxepin Hcl) .... Take 1 Capsule By Mouth Once A Day At Bedtime 21)  Iron 325 (65 Fe) Mg Tabs (Ferrous Sulfate) .... One By Mouth Three Times A Day 22)  Novolog 100 Unit/ml Soln (Insulin Aspart) .[name] Kitchen.. 15 Units Subcutaneous Injection  Prior To Breakfast and Lunch and Eveing Meal.  Disp: 1 Month Supply. 23)  Vitamin D 1000 Unit Tabs (Cholecalciferol) .... Take 1 Tabs Daily 24)  Bd Insulin Syringe Ultrafine 30g X 1/2" 1 Ml Misc (Insulin Syringe-Needle U-100) .... Dispense Supply For Four Times Per Day Dosing of Insulin. 25)  Hydrocodone-Acetaminophen 5-325 Mg Tabs (Hydrocodone-Acetaminophen) .[name] Kitchen.. 1 By Mouth Up To 4 Times Per Day As Needed For Pain 26)  Ventolin Hfa 108 (90 Base) Mcg/act Aers (Albuterol Sulfate) .... Take One Puff Q4 Hours. 27)  Spironolactone 100 Mg Tabs  (Spironolactone) .... Once Daily in Am 28)  Prednisone 5 Mg Tabs (Prednisone) .... Once Daily 29)  Bethanechol Chloride 5 Mg Tabs (Bethanechol Chloride) .... One By Mouth Two Times A Day 30)  Diflucan 100 Mg Tabs (Fluconazole) .... One By Mouth X 1 31)  Nystatin 100000 Unit/gm Powd (Nystatin) .... Apply To Afected Area Two Times A Day As Needed Rash. For Groin and Pannus.  Allergies: 1)  ! Codeine 2)  ! Penicillin 3)  ! Sulfa 4)  ! * Lithium 5)  ! Valium 6)  ! * Cogentin 7)  ! Lisinopril (Lisinopril) 8)  ! * Hyddal 9)  ! Diphenhydramine Hcl (Diphenhydramine Hcl) 10)  Thioridazine Hcl (Thioridazine Hcl)  Physical Exam  General:  Well-developed, well-nourished, in no acute distress; alert, appropriate and cooperative throughout examination. Vitals reviewed. Extremities:  right 5th toe is swollen.  deep bruise on lateral aspect.  painful to touch/movement Additional Exam:  vital signs reviewed    Impression & Recommendations:  Problem # 1:  DIABETES MELLITUS, TYPE I, UNCONTROLLED (ICD-250.03) Assessment Deteriorated  challenging since she is on chronic prednisone.  increase lantus to 60 two times a day.  increase lunch and dinner novolog to 20.  f/u next week with PCP.  bring sugar log.  she is to call me if high or low sugars in the meantime Her updated medication list for this problem includes:    Metformin Hcl 500 Mg Tabs (Metformin hcl) .[name] Kitchen... Take 1 tablet by mouth twice a day    Lantus 100 Unit/ml Soln (Insulin glargine) .[name] KitchenMarland KitchenMarland KitchenMarland Kitchen 60 units in the am and 60 units in the pm. dispense one month supply.    Novolog 100 Unit/ml Soln (Insulin aspart) .[name] KitchenMarland KitchenMarland KitchenMarland Kitchen 15 units subcutaneous injection  prior to breakfast 20 units with lunch and 20 units with eveing meal.  disp: 1 month supply.  Orders: [place]- Est  Level 4 [id number])  Problem # 2:  ? of FRACTURE, TOE, RIGHT (ICD-826.0) Assessment: New  concerned that this may be fractured.  deep bruise.  definitely has a high risk foot.  get  x-ray  Orders: Radiology other (Radiology Other) [facility]- Est  Level 4 [id number])  Complete Medication List: 1)  Advair Diskus 500-50 Mcg/dose Misc (Fluticasone-salmeterol) .[name] Kitchen.. 1 puff inh two times a day 2)  Allopurinol 300 Mg Tabs (Allopurinol) .... Take 1  tablet by mouth twice a day per dr trueslow 3)  Ascensia Elite Test Strp (Glucose blood) .... Test blood sugarfour times daily 4)  Doxepin Hcl 100 Mg Caps (Doxepin hcl) .... Take 1 capsule by mouth at bedtime 5)  Epipen 2-pak  0.3 Mg/0.24ml (1:1000) Devi (Epinephrine hcl (anaphylaxis)) .... Inject 1 pen subcutaneously as directed 6)  Lasix 80 Mg Tabs (Furosemide) .... Take 1 tablet by mouth twice a day 7)  Lexapro 10 Mg Tabs (Escitalopram oxalate) .... Take 1 tablet by mouth once a day 8)  Metformin Hcl 500 Mg Tabs (Metformin hcl) .... Take 1 tablet by mouth twice a day 9)  Potassium Chloride Crys Cr 20 Meq Tbcr (Potassium chloride crys cr) .... Take 4  tabs by mouth in the am, 4 at noon, and 4 at hs 10)  Ranitidine Hcl 300 Mg Caps (Ranitidine hcl) .... Take 1 capsule by mouth once a day 11)  Zocor 10 Mg Tabs (Simvastatin) .... Take 1 tablet by mouth once a day 12)  Nexium 40 Mg Cpdr (Esomeprazole magnesium) .[name] Kitchen.. 1 tablet by mouth twice daily 13)  Calcium 500 500 Mg Tabs (Calcium carbonate) .[name] Kitchen.. 1 tablet by mouth daily 14)  Methotrexate 2.5 Mg Tabs (Methotrexate sodium) .... Take as directed by dr. Charlestine Night 12.5 mg (5 pills) on saturday and sunday 15)  Lyrica 100 Mg Caps (Pregabalin) .[name] Kitchen.. 1 tab by mouth three times a day 16)  Lantus 100 Unit/ml Soln (Insulin glargine) .... 60 units in the am and 60 units in the pm. dispense one month supply. 17)  Warfarin Sodium 5 Mg Tabs (Warfarin sodium) .... Take as directed 18)  Folic Acid 1 Mg Tabs (Folic acid) .[name] Kitchen.. 1 tablet by mouth daily while on methotrexate 19)  Clonidine Hcl 0.2 Mg Tabs (Clonidine hcl) .... 2 by mouth at bedtime for depression 20)  Doxepin Hcl 25 Mg Caps (Doxepin hcl) .... Take 1  capsule by mouth once a day at bedtime 21)  Iron 325 (65 Fe) Mg Tabs (Ferrous sulfate) .... One by mouth three times a day 22)  Novolog 100 Unit/ml Soln (Insulin aspart) .[name] Kitchen.. 15 units subcutaneous injection  prior to breakfast 20 units with lunch and 20 units with eveing meal.  disp: 1 month supply. 23)  Vitamin D 1000 Unit Tabs (Cholecalciferol) .... Take 1 tabs daily 24)  Bd Insulin Syringe Ultrafine 30g X 1/2" 1 Ml Misc (Insulin syringe-needle u-100) .... Dispense supply for four times per day dosing of insulin. 25)  Hydrocodone-acetaminophen 5-325 Mg Tabs (Hydrocodone-acetaminophen) .[name] Kitchen.. 1 by mouth up to 4 times per day as needed for pain 26)  Ventolin Hfa 108 (90 Base) Mcg/act Aers (Albuterol sulfate) .... Take one puff q4 hours. 27)  Spironolactone 100 Mg Tabs (Spironolactone) .... Once daily in am 28)  Prednisone 5 Mg Tabs (Prednisone) .... Once daily 29)  Bethanechol Chloride 5 Mg Tabs (Bethanechol chloride) .... One by mouth two times a day 30)  Diflucan 100 Mg Tabs (Fluconazole) .... One by mouth x 1 31)  Nystatin 100000 Unit/gm Powd (Nystatin) .... Apply to afected area two times a day as needed rash. for groin and pannus.  Patient Instructions: 1)  It was nice to see you today. 2)  For your sugars, increase your lantus to 60 units two times a day. 3)  Change your meal coverage to: 4)  15 units at breakfast 5)  20 units at lunch and dinner 6)  Call me if your sugars go below 60. 7)  Call me if your sugars are still high. 8)  I will call you with your x-ray results.  9)  Please schedule a follow-up appointment in 1-2 weeks with Dr Juleen China for your diabetes.  Bring a sugar log.   Prevention & Chronic Care Immunizations  Influenza vaccine: Fluvax 3+  (05/21/2009)   Influenza vaccine due: 06/19/2008    Tetanus booster: 08/16/2001: Done.   Tetanus booster due: 08/17/2011    Pneumococcal vaccine: Done.  (09/16/2004)   Pneumococcal vaccine due: None  Colorectal Screening    Hemoccult: Done.  (12/14/2000)   Hemoccult due: Not Indicated    Colonoscopy: Done.  (03/16/2006)   Colonoscopy due: 03/16/2016  Other Screening   Pap smear: Done.  (08/16/2006)   Pap smear action/deferral: Deferred  (05/21/2009)   Pap smear due: 08/16/2009    Mammogram: ASSESSMENT: Negative - BI-RADS 1^MM DIGITAL SCREENING  (03/25/2009)   Mammogram action/deferral: Not indicated  (09/11/2009)   Mammogram due: 03/25/2010   Smoking status: never  (11/20/2009)  Diabetes Mellitus   HgbA1C: 10.3  (08/18/2009)   Hemoglobin A1C due: 09/30/2007    Eye exam: Not documented    Foot exam: yes  (10/20/2009)   Foot exam action/deferral: Do today   High risk foot: Yes  (09/11/2009)   Foot care education: Done  (09/11/2009)   Foot exam due: 05/03/2008    Urine microalbumin/creatinine ratio: Not documented    Diabetes flowsheet reviewed?: Yes   Progress toward A1C goal: Unchanged    Stage of readiness to change (diabetes management): Action  Lipids   Total Cholesterol: 169  (06/30/2009)   LDL: 49  (06/30/2009)   LDL Direct: 96  (02/28/2008)   HDL: 43  (06/30/2009)   Triglycerides: 384  (06/30/2009)    SGOT (AST): 19  (05/21/2009)   SGPT (ALT): 39  (05/21/2009)   Alkaline phosphatase: 99  (05/21/2009)   Total bilirubin: 0.5  (05/21/2009)  Hypertension   Last Blood Pressure: 126 / 80  (11/20/2009)   Serum creatinine: 1.03  (09/24/2009)   Serum potassium 4.2  (09/24/2009)  Self-Management Support :   Personal Goals (by the next clinic visit) :     Personal A1C goal: 8  (07/13/2009)     Personal blood pressure goal: 130/80  (05/21/2009)     Personal LDL goal: 100  (05/21/2009)    Diabetes self-management support: Copy of home glucose meter record, CBG self-monitoring log, Written self-care plan  (11/20/2009)   Diabetes care plan printed    Hypertension self-management support: Written self-care plan  (11/20/2009)   Hypertension self-care plan printed.    Hypertension  self-management support not done because: Good outcomes  (09/23/2009)    Lipid self-management support: Written self-care plan  (11/20/2009)   Lipid self-care plan printed.    Lipid self-management support not done because: Good outcomes  (09/23/2009)

## 2010-09-15 NOTE — Assessment & Plan Note (Signed)
Summary: dnka            Complete Medication List: 1)  Advair Diskus 250-50 Mcg/dose Misc (Fluticasone-salmeterol) .... Inhale 1 puff as directed twice a day 2)  Albuterol 90 Mcg/act Aers (Albuterol) .... Inhale 1 puff using inhaler four times a day 3)  Allopurinol 300 Mg Tabs (Allopurinol) .... Take 1 tablet by mouth twice a day 4)  Amitriptyline Hcl 25 Mg Tabs (Amitriptyline hcl) .... Take 1 tablet by mouth every night 5)  Ascensia Elite Test Strp (Glucose blood) .[name] Kitchen.. 1 strip once a day 6)  Clonidine Hcl 0.2 Mg Tabs (Clonidine hcl) .... Take 2 tablet by mouth every night 7)  Colchicine 0.6 Mg Tabs (Colchicine) .... Take 1 tablet by mouth twice a day 8)  Darvocet-n 100 100-650 Mg Tabs (Propoxyphene n-apap) .... Take 1 tablet by mouth every six hours- needs appointment before any more refills 9)  Diovan 40 Mg Tabs (Valsartan) .... Take 1 tablet by mouth once a day 10)  Doxepin Hcl 100 Mg Caps (Doxepin hcl) .... Take 1 capsule by mouth at bedtime 11)  Epipen 2-pak 0.3 Mg/0.23ml (1:1000) Devi (Epinephrine hcl (anaphylaxis)) .... Inject 1 pen subcutaneously as directed 12)  Glipizide 10 Mg Tb24 (Glipizide) .... Take 1 tablet by mouth once a day 13)  Hydrochlorothiazide 25 Mg Tabs (Hydrochlorothiazide) .... Take 1 tablet by mouth twice a day 14)  Lasix 80 Mg Tabs (Furosemide) .... Take 1 tablet by mouth twice a day 15)  Lexapro 10 Mg Tabs (Escitalopram oxalate) .... Take 1 tablet by mouth once a day 16)  Metformin Hcl 500 Mg Tabs (Metformin hcl) .... Take 1 tablet by mouth twice a day 17)  Potassium Chloride Crys Cr 20 Meq Tbcr (Potassium chloride crys cr) .... Take 3 tablet by mouth twice times a day and 2 tablets once daily 18)  Prempro 0.3-1.5 Mg Tabs (Conj estrog-medroxyprogest ace) .... Take 1 tablet by mouth once a day 19)  Ranitidine Hcl 300 Mg Caps (Ranitidine hcl) .... Take 1 capsule by mouth once a day 20)  Spironolactone 50 Mg Tabs (Spironolactone) .... Take 1 tablet by mouth  once a day 21)  Tgt Aspirin 81 Mg Tbec (Aspirin) .... Take 1 tablet by mouth once a day 22)  Zocor 10 Mg Tabs (Simvastatin) .... Take 1 tablet by mouth once a day 23)  Nexium 40 Mg Cpdr (Esomeprazole magnesium) .[name] Kitchen.. 1 tablet by mouth twice daily 24)  Calcium 500 500 Mg Tabs (Calcium carbonate) .[name] Kitchen.. 1 tablet by mouth tid 25)  Methotrexate 2.5 Mg Tabs (Methotrexate sodium) .... Take as directed by dr. Charlestine Night 26)  Doxepin Hcl 25 Mg Caps (Doxepin hcl) .[name] Kitchen.. 1 tablet by mouth qhs 27)  Oxybutynin Chloride 5 Mg Tabs (Oxybutynin chloride) .[name] Kitchen.. 1 tablet by mouth two times a day as directed    ]

## 2010-09-15 NOTE — Progress Notes (Signed)
Summary: phn msg  Phone Note Call from Patient Call back at Home Phone [phone]   Caller: Neice-Theresa Summary of Call: upset about new Dr. Gerrianne Scale her Doxipen.  She can not go down on that dosage and worried that Dr. Juleen China will try and make her.  Also about her Ranitidine she does not want to lower her dosage of this either. Initial call taken by: Raymond Gurney,  March 24, 2009 9:35 AM    Please let the patient know that I will continue to prescribe those medications. Please ask her to make an appointment at the pharmacy clinic so that we can go over all of her medications.   Appended Document: phn msg CALLED AND INFORMED, BUT SHE DISAGREED. DOES NOT WANT TO Ada. OV AT PHARMACY CLINIC. FWD. TO DR.Trystyn Dolley FOR REVIEW.  Appended Document: phn msg That is fine. We will not adjust any medications at this time.

## 2010-09-15 NOTE — Consult Note (Signed)
Summary: [name], [facility]  [name], [facility]   Imported By: Tobin Chad 10/29/2009 11:02:27  _____________________________________________________________________  External Attachment:    Type:   Image     Comment:   External Document

## 2010-09-15 NOTE — Miscellaneous (Signed)
Summary: triage walk in  Clinical Lists Changes  pt comes in office reporting she has just come from dr. Elmon Else office . had bp reading of 76/50 in his office. was also told potassium is low. has had recent episodes of dizziness and lightheadedness. appointment scheduled to be seen now.

## 2010-09-15 NOTE — Assessment & Plan Note (Signed)
Summary: FU/KH   Vital Signs:  Patient Profile:   60 Years Old Female Weight:      219 pounds Pulse rate:   116 / minute BP sitting:   113 / 72  Pt. in pain?   no  Vitals Entered By: [name] CMA, (December 01, 2006 2:31 PM)              Is Patient Diabetic? Yes    PCP:  Dixon Boos MD  Chief Complaint:  regular check up/discuss PFT results.  History of Present Illness: Pt with no new complaints.  Curious to discuss PFT results from 2/08.  Also concerned because her sugars have been running 200-300 (and sometimes even 400) fasting.    Diabetes Management History:      The patient is a 60 years old female who comes in for evaluation of DM Type 2.  Sensory loss is noted.  Self foot exams are being performed.  She is checking home blood sugars.  She says that she is exercising.  Type of exercise includes: walking.  Duration of exercise is estimated to be 15 min.  She is doing this 7 times per week.        Hypoglycemic symptoms are not occurring.  Hyperglycemic symptoms include blurred vision.  Frequency of hyperglycemic symptoms are reported to be occasionally.  Other comments include: also gets shaky hands with hyperglycemic episodes.        Symptoms which suggest diabetic complications include paresthesias.  Since her last visit, no infections have occurred.  No changes have been made to her treatment plan since last visit.       Past Medical History:    amenorrhea since 60 years old    Barrets Esophagus - EGD 8/07. (Repeat 2-3 years)    CA-125 12 (less than 35 normal) 11/28/2000    CBC wnl and Neg Rheum w/u and neg CCP Ab (8/06)    Nephrolithiasis 2/03    stroke at age 43?    Colonoscopy  benign polyps (12/2000)     echo-5/05-poor study, grossly nL     Endometrial biopsy 10/03 benign columnar mucosa (limited material)    stress test 04/2006- negative per patient     PFT's (10/14/06)- mild obstructive airway dz, poor effort  Past Surgical History:    Appendectomy -  08/28/2001    bladder stretching - 08/28/2001    Cholecystectomy - 08/28/2001    Colonoscopy  benign polyps (12/2000) - 10/30/2001    echo-5/05-poor study, grossly nL - 01/09/2004    Endometrial biopsy 10/03 benign columnar mucosa (limited material) - 06/11/2002    stress test 04/2006- negative per patient - 06/14/2006    Risk Factors:  Tobacco use:  quit    Year quit:  1973 Exercise:  yes    Times per week:  7    Type:  walking    Physical Exam  Mouth:     dry mucous membranes, poor dentiton Lungs:     CTAB no wheezes Heart:     tachycardic, regular rhythm Abdomen:     obese  Diabetes Management Exam:    Foot Exam (with socks and/or shoes not present):       Sensory-Monofilament:          Left foot: diminished          Right foot: diminished       Inspection:          Left foot: abnormal  Comments: no ulcers, s/p hammer toe repair procedure          Right foot: abnormal             Comments: no ulcers, s/p hammer toe repair procedure       Nails:          Left foot: thickened          Right foot: thickened    Impression & Recommendations:  Problem # 1:  DIABETES MELLITUS II, UNCOMPLICATED ([id number]) Worsening sugars recently with deteriorated HgbA1c.  A1c was previously 7.1 in 12/07.  She only takes glipizide 10mg  daily.  I will start metformin 500mg  two times a day with room to increase if we need to.  I also reinforced necessity of keeping regular ophtho appointments for diabetic eye exam.  She is already doing daily foot inspections.  I also discussed fruits and vegetables and low carbohydrate diet.  I suspect most of her hyperglycemia is due to chronic steroid use, which Dr. Charlestine Night has not successfully tapered. She is currently on lisinopril, which I will change to diovan for reported cough since starting it.  She also takes aspirin daily. She will return in 2-3 months with a CBG log to determine if we need to further increase her metformin  dose. Orders: [facility] [id number]) [place]- Est Level  3 [id number]) [facility] [id number])   Problem # 2:  COPD (ICD-496) Discussed results of PFT's from 2/08.  Consistent with Mild obstructive disease.  Pt to continue spiriva and albuterol as needed.  Problem # 3:  Screening Breast Cancer (ICD-V76.10) given phone number to call and make appt for screening mammogram.  Last one was 10/06.  Diabetes Management Assessment/Plan:      The following lipid goals have been established for the patient: Total cholesterol goal of 200; LDL cholesterol goal of 100; HDL cholesterol goal of 40; Triglyceride goal of 200.  Her blood pressure goal is < 130/80.     Patient Instructions: 1)  Please schedule a follow-up appointment in 3 months. 2)  Bring a log of your sugars to your next visit so we can see if your new diabetes medicine (metformin) is helping to lower your sugars. 3)  make an appointment for a screening mammogram 4)  See your eye doctor. 5)  KEEP EXERCISING.    Laboratory Results   Blood Tests   Date/Time Recieved: December 01, 2006 3:12 PM  Date/Time Reported: December 01, 2006 3:24 PM   HGBA1C: 8.8%   (Normal Range: Non-Diabetic - 3-6%   Control Diabetic - 6-8%)  Comments: ...............test performed by......[name] KitchenBonnie A. [place], [place] (ASCP)

## 2010-09-15 NOTE — Initial Assessments (Signed)
Summary: Attending Admit Note 05/12/2010  I saw Ms Blumer.  I discussed with Dr Luberta Mutter.  I agree with her findings and plans as documented in her admit note for today.

## 2010-09-15 NOTE — Progress Notes (Signed)
Summary: Hypokalemia   Patient with chronic hypokalemia. She is on both Lasix and HCTZ. Cards rec: stopping HCTZ at her last visit in September. Agree with recommendation. Plan:  1. Stop HCTZ. Please tell the patient that this should NOT cause her to retain fluid, but will help keep potassium. 2. Follow up in 1 week for BP and K check. I will add orders.  NOTE: This patient is very resistant to medication changes, so please let me know if she has any problems or questions.  Appended Document: Orders Update    Clinical Lists Changes  Orders: Added new Test order of [facility] [phone]) - Signed      Appended Document: Hypokalemia Tried to call pt. twice but line is busy.   Appended Document: Hypokalemia Niece Helene Kelp said that they have tried to take her off of the HCTZ in the past and she "swells up" in a matter of hours.  Info forwarded to MD.

## 2010-09-15 NOTE — Assessment & Plan Note (Signed)
Summary: flu shot/jones/el  Nurse Visit    Prior Medications: ADVAIR DISKUS 250-50 MCG/DOSE MISC (FLUTICASONE-SALMETEROL) Inhale 1 puff as directed twice a day ALBUTEROL 90 MCG/ACT AERS (ALBUTEROL) Inhale 1 puff using inhaler four times a day ALLOPURINOL 300 MG TABS (ALLOPURINOL) Take 1 tablet by mouth twice a day AMITRIPTYLINE HCL 25 MG TABS (AMITRIPTYLINE HCL) Take 1 tablet by mouth every night ASCENSIA ELITE TEST  STRP (GLUCOSE BLOOD) 1 strip once a day CLONIDINE HCL 0.2 MG TABS (CLONIDINE HCL) Take 2 tablet by mouth every night COLCHICINE 0.6 MG TABS (COLCHICINE) Take 1 tablet by mouth twice a day DARVOCET-N 100 100-650 MG TABS (PROPOXYPHENE N-APAP) Take 1 tablet by mouth every six hours DIOVAN 40 MG TABS (VALSARTAN) Take 1 tablet by mouth once a day DOXEPIN HCL 100 MG CAPS (DOXEPIN HCL) Take 1 capsule by mouth at bedtime EPIPEN 2-PAK 0.3 MG/0.3ML (1:1000) DEVI (EPINEPHRINE HCL (ANAPHYLAXIS)) Inject 1 pen subcutaneously as directed GLIPIZIDE 10 MG TB24 (GLIPIZIDE) Take 1 tablet by mouth once a day HYDROCHLOROTHIAZIDE 25 MG TABS (HYDROCHLOROTHIAZIDE) Take 1 tablet by mouth twice a day LASIX 80 MG TABS (FUROSEMIDE) Take 1 tablet by mouth twice a day LEXAPRO 10 MG TABS (ESCITALOPRAM OXALATE) Take 1 tablet by mouth once a day LISINOPRIL 20 MG TABS (LISINOPRIL) Take 1 tablet by mouth once a day METFORMIN HCL 500 MG TABS (METFORMIN HCL) Take 1 tablet by mouth twice a day NYSTATIN 100000 UNIT/GM CREA (NYSTATIN) Apply 1 a small amount to skin three times a day POTASSIUM CHLORIDE CRYS CR 20 MEQ TBCR (POTASSIUM CHLORIDE CRYS CR) Take 3 tablet by mouth twice times a day and 2 tablets once daily PREMPRO 0.3-1.5 MG TABS (CONJ ESTROG-MEDROXYPROGEST ACE) Take 1 tablet by mouth once a day RANITIDINE HCL 300 MG CAPS (RANITIDINE HCL) Take 1 capsule by mouth once a day SPIRONOLACTONE 50 MG TABS (SPIRONOLACTONE) Take 1 tablet by mouth once a day TGT ASPIRIN 81 MG TBEC (ASPIRIN) Take 1 tablet by mouth  once a day ZOCOR 10 MG TABS (SIMVASTATIN) Take 1 tablet by mouth once a day BENZONATATE 100 MG  CAPS (BENZONATATE) 1 tablet by mouth three times a day as needed for cough     Orders Added: 1)  Flu Vaccine & Administration [id number] Flu Vaccine Consent Questions     Do you have a history of severe allergic reactions to this vaccine? no    Any prior history of allergic reactions to egg and/or gelatin? no    Do you have a sensitivity to the preservative Thimersol? no    Do you have a past history of Guillan-Barre Syndrome? no    Do you currently have an acute febrile illness? no    Have you ever had a severe reaction to latex? no    Vaccine information given and explained to patient? yes    Are you currently pregnant? no  Lot Number:[id number] Site Given  L Deltoid  .fpcflu  ]

## 2010-09-15 NOTE — Consult Note (Signed)
Summary: [facility] Consultation Report  [facility] Consultation Report   Imported By: Sallee Provencal 01/28/2009 16:34:32  _____________________________________________________________________  External Attachment:    Type:   Image     Comment:   External Document

## 2010-09-15 NOTE — Assessment & Plan Note (Signed)
Summary: recent falls, LE edema, hypokalemia, polypharmacy   Vital Signs:  Patient profile:   60 year old female Height:      60 inches Weight:      221 pounds BMI:     43.32 BSA:     1.95 Temp:     98.0 degrees F Pulse rate:   94 / minute Pulse (ortho):   93 / minute BP sitting:   110 / 70 BP standing:   110 / 75  Vitals Entered By: Christen Bame CMA (May 21, 2009 8:40 AM)  Serial Vital Signs/Assessments:  Time      Position  BP       Pulse  Resp  Temp     By 9:36 AM   Lying RA  119/77   94                    Jessica Fleeger CMA 9:36 AM   Sitting   111/73   92                    Jessica Fleeger CMA 9:36 AM   Standing  110/75   93                    Jessica Fleeger CMA  CC: f/u fall, hypokalemia Is Patient Diabetic? No Pain Assessment Patient in pain? no        Primary Care Provider:  Briscoe Deutscher DO  CC:  f/u fall and hypokalemia.  History of Present Illness: [name] is 60 year old female with multiple medical problems, presenting for f/u fall, hypokalemia.  1. Falls, multiple types:            - patient reports several falls recently. the latest happened 3 days ago after she finished her bath and stood up to get out of the tub. she says that everything went dark and then she hit the floor. she did hit her head. she denies HA, vision changes, bleeding or open lesion on scalp, focal deficits. her niece denies any change in the patient's neuro status, personality, or level of alertness.             - others falls have happened after the patient is standing and then moves her head. the room spins, she looses her balance, and falls. the dizziness goes away immediately after. she denies recent viral illness, ear pain, a feeling of stuffy ears, or past history of treatment for vertigo.           - she has tripped and fell a few times. she has bilateral knee replacements, gout, recent toe fracture, DVT/PE, and peripheral neuropathy.           - sometimes she  states that she is so sleepy that she can fall asleep while eating her cereal, which makes her worried about falling.  2. Lower Extremity Edema: Rx: Lasix 80 two times a day, HCTZ 25 two times a day, spironolactone 50 daily, KCl 20: 2 in the am, 2 at noon, 3 at hs. I recently asked the patient to stop taking the HCTZ and she refused (see phone notes). she uses compression hose during the day and a wedge at night to elevate her legs for  ~30 minutes. she LOVES salt and admits that she uses a lot of it. she also eats high-salt foods like bacon. she denies CP, SOB.  3. Hypokalemia: 3.2 at last check.   4. Polypharmacy:  reviewed and updated medication list. the patient's granddaughter is in charge of these, makes sure that the patient takes them daily and as prescribed. they both note that the patient has been on all of these medications for a long time and that she always seems to have to go to the hospital when a medication is changed or taken away.       Habits & Providers  Alcohol-Tobacco-Diet     Tobacco Status: never  Current Medications (verified): 1)  Advair Diskus 500-50 Mcg/dose Misc (Fluticasone-Salmeterol) .[name] Kitchen.. 1 Puff Inh Two Times A Day 2)  Albuterol 90 Mcg/act Aers (Albuterol) .... Inhale 1 Puff Using Inhaler Four Times A Day 3)  Allopurinol 300 Mg Tabs (Allopurinol) .... Take 1  Tablet By Mouth Twice A Day Per Dr Jolee Ewing 4)  Ascensia Elite Test  Strp (Glucose Blood) .... Test Blood Sugar Twice Daily 5)  Darvocet-N 100 100-650 Mg Tabs (Propoxyphene N-Apap) .... Take 1 Tablet By Mouth Every Six Hours- Needs Appointment Before Any More Refills 6)  Doxepin Hcl 100 Mg Caps (Doxepin Hcl) .... Take 1 Capsule By Mouth At Bedtime 7)  Epipen 2-Pak 0.3 Mg/0.1ml (1:1000) Devi (Epinephrine Hcl (Anaphylaxis)) .... Inject 1 Pen Subcutaneously As Directed 8)  Glipizide 10 Mg Tb24 (Glipizide) .... Take 1 Tablet By Mouth Once A Day 9)  Lasix 80 Mg Tabs (Furosemide) .... Take 1 Tablet By Mouth  Twice A Day 10)  Lexapro 10 Mg Tabs (Escitalopram Oxalate) .... Take 1 Tablet By Mouth Once A Day 11)  Metformin Hcl 500 Mg Tabs (Metformin Hcl) .... Take 1 Tablet By Mouth Twice A Day 12)  Potassium Chloride Crys Cr 20 Meq Tbcr (Potassium Chloride Crys Cr) .... Take 2 Tabs By Mouth in The Am, 3 At Noon, and 3 At [facility] 13)  Ranitidine Hcl 300 Mg Caps (Ranitidine Hcl) .... Take 1 Capsule By Mouth Once A Day 14)  Spironolactone 50 Mg Tabs (Spironolactone) .... Take 1 Tablet By Mouth Once A Day 15)  Zocor 10 Mg Tabs (Simvastatin) .... Take 1 Tablet By Mouth Once A Day 16)  Nexium 40 Mg  Cpdr (Esomeprazole Magnesium) .[name] Kitchen.. 1 Tablet By Mouth Twice Daily 17)  Calcium 500 500 Mg  Tabs (Calcium Carbonate) .[name] Kitchen.. 1 Tablet By Mouth Daily 18)  Methotrexate 2.5 Mg  Tabs (Methotrexate Sodium) .... Take As Directed By Dr. Charlestine Night 19)  Oxybutynin Chloride 5 Mg  Tabs (Oxybutynin Chloride) .[name] Kitchen.. 1 Tablet By Mouth Two Times A Day As Directed 20)  Lyrica 100 Mg  Caps (Pregabalin) .[name] Kitchen.. 1 Tab By Mouth Three Times A Day 21)  Lantus 100 Unit/ml Soln (Insulin Glargine) .... 42 Units Daily At Bedtime 22)  Warfarin Sodium 5 Mg Tabs (Warfarin Sodium) .... Take As Directed 23)  Folic Acid 1 Mg Tabs (Folic Acid) .[name] Kitchen.. 1 Tablet By Mouth Daily While On Methotrexate 24)  Clonidine Hcl 0.2 Mg Tabs (Clonidine Hcl) .... 2 By Mouth At Bedtime For Depression 25)  Amitriptyline Hcl 25 Mg Tabs (Amitriptyline Hcl) .... Take One Tablet At Bedtime 26)  Prednisone 5 Mg Tabs (Prednisone) .[name] Kitchen.. 1 Daily By Dr Jolee Ewing 27)  Ferrous Sulfate 325 (65 Fe) Mg  Tbec (Ferrous Sulfate) .... 3x Per Day 28)  Doxepin Hcl 25 Mg Caps (Doxepin Hcl) .... Take 1 Capsule By Mouth Once A Day At Bedtime 29)  Propoxyphene N-Apap 100-650 Mg Tabs (Propoxyphene N-Apap) .... At Bedtime 30)  Iron 325 (65 Fe) Mg Tabs (Ferrous Sulfate) .... One By Mouth Three Times A Day 31)  Hydrochlorothiazide 25 Mg Tabs (Hydrochlorothiazide) .... 1/2 Tab Two Times A Day  Allergies  (verified): 1)  ! Codeine 2)  ! Penicillin 3)  ! Sulfa 4)  ! * Lithium 5)  ! Valium 6)  ! * Cogentin 7)  ! Lisinopril (Lisinopril) 8)  ! * Hyddal  Past History:  Past medical, surgical, family and social histories (including risk factors) reviewed, and no changes noted (except as noted below).  Past Medical History: 1. Amenorrhea since 60 years old 2. Barretts Esophagus - EGD 8/07. (Repeat 2-3 years) 3. Nephrolithiasis 2/03 4. Colonoscopy- benign polyps (12/2000), repeat colonoscopy performed in 2007 5. Endometrial biopsy 10/03 benign columnar mucosa (limited material) 6. Asthma: PFT's (10/14/06)- mild obstructive airway dz, poor effort 7. Obesity-hypoventilation syndrome: uses CPAP at night. 8. Obesity 9. Diastolic CHF: EF [id number] on echo (5/10) 10.  Pulmonary embolus (4/10): ? trigger.  11.  Acute pericarditis/pleuritis (5/10): Echo (5/10) showed EF 55%, possible posterior HK, > 25% respiratory variation of mitral inflow suggesting some interventricular interdependence, IVC not significantly dilated.  There was a small organized pericardial effusion and a moderate left pleural effusion. Some evidence suggesting constrictive physiology.  ESR was 108. Patient treated with high dose ASA and colchicine.  12. CKD: last creatinine 1.12 13. Chronic anemia 14. Diabetes mellitus type II 15. Gout 16. Rheumatoid arthritis (followed by Dr. Charlestine Night). 17. Depression 18. GERD 19. HTN 20. Hyperlipidemia 21. ? Schizophrenia 22. Polypharmacy 23. 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 Chronic Coumadin. 25. Hypokalemia  Past Surgical History: Appendectomy - 08/28/2001 Bladder stretching - 08/28/2001 Cholecystectomy - 08/28/2001 B/L knee replacement- Sept & Nov 2008 (Dr. Alvan Dame)  Family History: Reviewed history from 04/25/2009 and no changes required. 6 brothers and 6 sisters., Father died of black lung, Mother with bone cancer, One brother with lung cancer and another with` blood` cancer., One sister died  of colon cancer., Strong FH of DM, CAD, and kidney problems  Social History: Reviewed history from 10/13/2006 and no changes required. Divorced for greater than 30 years.  Born in [place] but lived in [place] for most of her life. No children. Stopped smoking in 1973, no etoh, no drug use.  Pt was adopted.  Not working secondary to schizophrenia.  Niece, Helene Kelp, helps care for her.  Not sexually active.  Review of Systems General:  Denies chills, fever, and loss of appetite. CV:  Complains of fainting, lightheadness, near fainting, and swelling of feet; denies chest pain or discomfort, palpitations, and shortness of breath with exertion. Resp:  Denies chest discomfort, cough, and shortness of breath. MS:  Complains of joint pain and muscle aches; denies joint redness and joint swelling. Derm:  Complains of lesion(s) and poor wound healing. Neuro:  Complains of falling down, numbness, poor balance, sensation of room spinning, tingling, and weakness.  Physical Exam  General:  obese, alert, pleasant. vitals reviewed. caregiver with patient.  Head:  normocephalic and atraumatic.   Eyes:  vision grossly intact, pupils equal, pupils round, and pupils reactive to light.   Ears:  R ear normal and L ear normal.   Nose:  no external deformity.   Mouth:  pharynx pink and moist.   Neck:  supple, full ROM, and no masses.   Lungs:  CTAB w/o wheeze or crackles  Heart:  distant but RRR no murmurs or rubs  Abdomen:  +BS, soft NT, ND, no masses Pulses:  2 + dp Extremities:  compression stockings on, left anterior leg inspected and found to have 1cm diameter open  lesion with clear fluid weeping. no erythema, warmth, or streaks.  Neurologic:  alert & oriented X3 and cranial nerves II-XII intact.   Decreased sensation lower ext. Psych:  Oriented X3, memory intact for recent and remote, normally interactive, good eye contact, and not anxious appearing.     Impression & Recommendations:  Problem # 1:  SYNCOPE, HX  OF (ICD-V12.49) Assessment New Per the patient's history, she has had multiple falls that could be attributed to multiple factors including orthostasis, vertigo, sedating medications, deconditioning, blood sugar changes, gout, OA, and decreased lower extremity sensation. The patient's history of falls is especially concerning in the setting of a patient of chronic coumadin therapy. I believe that the major risk factor that should be considered at this time is the patient's polypharmacy. However, she and her daughter have refused multiple times to stop taking any of her medications. Since I am a new provider for this patient, I will seek help from the pharmacy and geriatric clinics. I have also asked the patient to see her cardiologist again to focus on her hypokalemia, blood pressure, and edema.  Orders: [place]- Est  Level 4 [id number])  Problem # 2:  HYPOKALEMIA (ICD-276.8) Assessment: Unchanged This is a difficult issue to control since the patient is taking several medications (including Lasix, HCTZ, B-agonists) that loose K. She is taking spironolactone, but no ARB at this time. Precepted patient: BP may be too low (in the setting of a patient with Hx of syncope possibly related to orthostasis) to add another medication at this time. The patient has historically been very resistant to decreasing/discontinuing any of her medications. I have only been able to convince the patient to decrease her HCTZ by 1/2 today (she is currently on 25 mg two times a day). I reviewed the importance of potassium and foods high in potassium. Will check BMP, Mag.  Orders: Magnesium-[facility] [id number]) Comp Met-[facility] [id number]) [place]- Est  Level 4 [id number])  Problem # 3:  DIABETES MELLITUS II, UNCOMPLICATED ([id number]) Assessment: Unchanged A1c of 10 last month. Will need adjustment of medication. Will have patient come back within 1 week for a visit to focus on this issue.  The following medications were removed from the  medication list:    Diovan 40 Mg Tabs (Valsartan) ..... One half by mouth daily Her updated medication list for this problem includes:    Glipizide 10 Mg Tb24 (Glipizide) .[name] Kitchen... Take 1 tablet by mouth once a day    Metformin Hcl 500 Mg Tabs (Metformin hcl) .[name] Kitchen... Take 1 tablet by mouth twice a day    Lantus 100 Unit/ml Soln (Insulin glargine) .[name] Kitchen... 42 units daily at bedtime  Orders: [place]- Est  Level 4 [id number])  Problem # 4:  LEG EDEMA, BILATERAL (ICD-782.3) Assessment: Deteriorated Discussed the importance of a low salt diet in controlling this issue. Continue Lasix, Spironolactone. Continue compression stockings and wedge at night. Will monitor lesion on left anterior leg. No s/s of CHF exacerbation. Her updated medication list for this problem includes:    Lasix 80 Mg Tabs (Furosemide) .[name] Kitchen... Take 1 tablet by mouth twice a day    Spironolactone 50 Mg Tabs (Spironolactone) .[name] Kitchen... Take 1 tablet by mouth once a day    Hydrochlorothiazide 25 Mg Tabs (Hydrochlorothiazide) .[name] Kitchen... 1/2 tab two times a day  Orders: [place]- Est  Level 4 [id number])  Problem # 5:  Preventive Health Care (ICD-V70.0) Due for Physical.  Problem # 6:  Polypharmacy Please see #1.  Complete Medication List: 1)  Advair  Diskus 500-50 Mcg/dose Misc (Fluticasone-salmeterol) .[name] Kitchen.. 1 puff inh two times a day 2)  Albuterol 90 Mcg/act Aers (Albuterol) .... Inhale 1 puff using inhaler four times a day 3)  Allopurinol 300 Mg Tabs (Allopurinol) .... Take 1  tablet by mouth twice a day per dr trueslow 4)  Ascensia Elite Test Strp (Glucose blood) .... Test blood sugar twice daily 5)  Darvocet-n 100 100-650 Mg Tabs (Propoxyphene n-apap) .... Take 1 tablet by mouth every six hours- needs appointment before any more refills 6)  Doxepin Hcl 100 Mg Caps (Doxepin hcl) .... Take 1 capsule by mouth at bedtime 7)  Epipen 2-pak 0.3 Mg/0.56ml (1:1000) Devi (Epinephrine hcl (anaphylaxis)) .... Inject 1 pen subcutaneously as directed 8)  Glipizide 10 Mg  Tb24 (Glipizide) .... Take 1 tablet by mouth once a day 9)  Lasix 80 Mg Tabs (Furosemide) .... Take 1 tablet by mouth twice a day 10)  Lexapro 10 Mg Tabs (Escitalopram oxalate) .... Take 1 tablet by mouth once a day 11)  Metformin Hcl 500 Mg Tabs (Metformin hcl) .... Take 1 tablet by mouth twice a day 12)  Potassium Chloride Crys Cr 20 Meq Tbcr (Potassium chloride crys cr) .... Take 2 tabs by mouth in the am, 3 at noon, and 3 at hs 13)  Ranitidine Hcl 300 Mg Caps (Ranitidine hcl) .... Take 1 capsule by mouth once a day 14)  Spironolactone 50 Mg Tabs (Spironolactone) .... Take 1 tablet by mouth once a day 15)  Zocor 10 Mg Tabs (Simvastatin) .... Take 1 tablet by mouth once a day 16)  Nexium 40 Mg Cpdr (Esomeprazole magnesium) .[name] Kitchen.. 1 tablet by mouth twice daily 17)  Calcium 500 500 Mg Tabs (Calcium carbonate) .[name] Kitchen.. 1 tablet by mouth daily 18)  Methotrexate 2.5 Mg Tabs (Methotrexate sodium) .... Take as directed by dr. Charlestine Night 19)  Oxybutynin Chloride 5 Mg Tabs (Oxybutynin chloride) .[name] Kitchen.. 1 tablet by mouth two times a day as directed 20)  Lyrica 100 Mg Caps (Pregabalin) .[name] Kitchen.. 1 tab by mouth three times a day 21)  Lantus 100 Unit/ml Soln (Insulin glargine) .... 42 units daily at bedtime 22)  Warfarin Sodium 5 Mg Tabs (Warfarin sodium) .... Take as directed 23)  Folic Acid 1 Mg Tabs (Folic acid) .[name] Kitchen.. 1 tablet by mouth daily while on methotrexate 24)  Clonidine Hcl 0.2 Mg Tabs (Clonidine hcl) .... 2 by mouth at bedtime for depression 25)  Amitriptyline Hcl 25 Mg Tabs (Amitriptyline hcl) .... Take one tablet at bedtime 26)  Prednisone 5 Mg Tabs (Prednisone) .[name] Kitchen.. 1 daily by dr Jolee Ewing 27)  Ferrous Sulfate 325 (65 Fe) Mg Tbec (Ferrous sulfate) .... 3x per day 28)  Doxepin Hcl 25 Mg Caps (Doxepin hcl) .... Take 1 capsule by mouth once a day at bedtime 29)  Propoxyphene N-apap 100-650 Mg Tabs (Propoxyphene n-apap) .... At bedtime 30)  Iron 325 (65 Fe) Mg Tabs (Ferrous sulfate) .... One by mouth three times  a day 31)  Hydrochlorothiazide 25 Mg Tabs (Hydrochlorothiazide) .... 1/2 tab two times a day  Other Orders: Flu Vaccine 45yrs + [id number]) Admin 1st Vaccine [id number]) Vit D, 25 [facility] [id number]) Future Orders: Lipid-[facility] [id number]) ... 05/23/2010  Patient Instructions: 1)  It was great to see you today! 2)  Because your blood pressure is low-normal, I do not feel comfortable adding another medicine today. Please decrease the HCTZ to 1/2 tab twice daily if you will not stop taking it. 3)  We will check some labs today and let you  know what they show. 4)  I would like to send you to the [facility]. Please make an appt at the front desk. 5)  Also, I would like to send you back to the cardiologist to discuss the ARB.  Prescriptions: DIOVAN 40 MG TABS (VALSARTAN) one half by mouth daily  #30 x 0   Entered and Authorized by:   Briscoe Deutscher MD   Signed by:   Briscoe Deutscher MD on 05/21/2009   Method used:   Print then Give to Patient   RxID:   [id number]    Immunizations Administered:  Influenza Vaccine # 1:    Vaccine Type: Fluvax 3+    Site: right deltoid    Mfr: GlaxoSmithKline    Dose: 0.5 ml    Route: IM    Given by: Christen Bame CMA    Exp. Date: 02/12/2010    Lot #: [id number]    VIS given: 03/09/07 version given May 21, 2009.  Flu Vaccine Consent Questions:    Do you have a history of severe allergic reactions to this vaccine? no    Any prior history of allergic reactions to egg and/or gelatin? no    Do you have a sensitivity to the preservative Thimersol? no    Do you have a past history of Guillan-Barre Syndrome? no    Do you currently have an acute febrile illness? no    Have you ever had a severe reaction to latex? no    Vaccine information given and explained to patient? yes    Are you currently pregnant? no  Prevention & Chronic Care Immunizations   Influenza vaccine: Fluvax 3+  (05/21/2009)   Influenza vaccine due: 06/19/2008    Tetanus  booster: 08/16/2001: Done.   Tetanus booster due: 08/17/2011    Pneumococcal vaccine: Done.  (09/16/2004)   Pneumococcal vaccine due: None  Colorectal Screening   Hemoccult: Done.  (12/14/2000)   Hemoccult due: Not Indicated    Colonoscopy: Done.  (03/16/2006)   Colonoscopy due: 03/16/2016  Other Screening   Pap smear: Done.  (08/16/2006)   Pap smear action/deferral: Deferred  (05/21/2009)   Pap smear due: 08/16/2009    Mammogram: ASSESSMENT: Negative - BI-RADS 1^MM DIGITAL SCREENING  (03/25/2009)   Mammogram due: 07/18/2009   Smoking status: never  (05/21/2009)  Diabetes Mellitus   HgbA1C: 10.0   (05/06/2009)   Hemoglobin A1C due: 09/30/2007    Eye exam: Not documented    Foot exam: yes  (07/04/2008)   High risk foot: Not documented   Foot care education: Not documented   Foot exam due: 05/03/2008    Urine microalbumin/creatinine ratio: Not documented    Diabetes flowsheet reviewed?: Yes   Progress toward A1C goal: At goal  Lipids   Total Cholesterol: Not documented   LDL: 96  (02/28/2008)   LDL Direct: 96  (02/28/2008)   HDL: Not documented   Triglycerides: Not documented    SGOT (AST): 23  (02/24/2007)   SGPT (ALT): 43  (02/24/2007) CMP ordered    Alkaline phosphatase: 58  (02/24/2007)   Total bilirubin: 0.8  (02/24/2007)    Lipid flowsheet reviewed?: Yes   Progress toward LDL goal: Unchanged  Hypertension   Last Blood Pressure: 110 / 70  (05/21/2009)   Serum creatinine: 1.00  (05/06/2009)   Serum potassium 3.2  (05/06/2009) CMP ordered     Hypertension flowsheet reviewed?: Yes   Progress toward BP goal: At goal  Self-Management Support :   Personal Goals (by  the next clinic visit) :     Personal A1C goal: 7  (05/21/2009)     Personal blood pressure goal: 130/80  (05/21/2009)     Personal LDL goal: 100  (05/21/2009)    Patient will work on the following items until the next clinic visit to reach self-care goals:     Medications and  monitoring: take my medicines every day, bring all of my medications to every visit  (05/21/2009)     Eating: drink diet soda or water instead of juice or soda, eat more vegetables, use fresh or frozen vegetables, eat foods that are low in salt, eat baked foods instead of fried foods, eat fruit for snacks and desserts, limit or avoid alcohol  (05/21/2009)    Diabetes self-management support: Written self-care plan  (05/21/2009)   Diabetes care plan printed    Hypertension self-management support: Written self-care plan  (05/21/2009)   Hypertension self-care plan printed.    Lipid self-management support: Written self-care plan  (05/21/2009)   Lipid self-care plan printed.   Appended Document: patient instuctions after labs Patient with hypokalemia, DM, ARI (likely dehydration along with elevated BUN). Patient called with the below instructions:  (1) Increase your potassium to 3 pills in the am, 3 pills at noon, and 3 pills at night. (2) It is very important to do the 1/2 dose HCTZ if you will not discontinue it. (3) Foods that are high in potassium and won't affect coumadin level: dried apricots, avacado, banana, prune juice, beans, potatoes. Be careful of the sugar content. (4) Make sure to drink water throughout the day. (5) Your sugar is high. We need to address this as well. (6) When you come in for the coumadin level next week, make sure that we get a blood pressure and another BMP.   Appended Document: med rec    Clinical Lists Changes  Medications: Changed medication from POTASSIUM CHLORIDE CRYS CR 20 MEQ TBCR (POTASSIUM CHLORIDE CRYS CR) take 2 tabs by mouth in the am, 3 at noon, and 3 at hs to POTASSIUM CHLORIDE CRYS CR 20 MEQ TBCR (POTASSIUM CHLORIDE CRYS CR) take 3 tabs by mouth in the am, 3 at noon, and 3 at hs

## 2010-09-15 NOTE — Miscellaneous (Signed)
Summary: Refill on [facility]  Clinical Lists Changes Pt nieca cami in office today with daughter asking for refill on [name] to MD...............................................[name] KitchenLevert Feinstein Cross February [birth year]THL 11:06 AM  Medications: Rx of POTASSIUM CHLORIDE CRYS CR 20 MEQ TBCR (POTASSIUM CHLORIDE CRYS CR) take 4  tabs by mouth in the am, 4 at noon, and 4 at hs;  #360 x 0;  Signed;  Entered by: [name];  Authorized by: Briscoe Deutscher DO;  Method used: Electronically to [facility] [phone]*, [address] [place], [place]  [address], Ph: [phone], Fax: [phone]    Prescriptions: POTASSIUM CHLORIDE CRYS CR 20 MEQ TBCR (POTASSIUM CHLORIDE CRYS CR) take 4  tabs by mouth in the am, 4 at noon, and 4 at hs  #360 x 0   Entered by:   [name]   Authorized by:   Briscoe Deutscher DO   Signed by:   [name] on [id number]   Method used:   Electronically to        [facility] [phone]* (retail)       [place], [place]  [address]       Ph: [phone]       Fax: [phone]   RxID:   [id number]   Appended Document: Orders Update    Clinical Lists Changes  Medications: Rx of POTASSIUM CHLORIDE CRYS CR 20 MEQ TBCR (POTASSIUM CHLORIDE CRYS CR) take 4  tabs by mouth in the am, 4 at noon, and 4 at hs;  #360 x 3;  Signed;  Entered by: Briscoe Deutscher DO;  Authorized by: Briscoe Deutscher DO;  Method used: Electronically to [facility] [phone]*, [address], [place], [place]  [address], Ph: [phone], Fax: [phone]    Prescriptions: POTASSIUM CHLORIDE CRYS CR 20 MEQ TBCR (POTASSIUM CHLORIDE CRYS CR) take 4  tabs by mouth in the am, 4 at noon, and 4 at hs  #360 x 3   Entered and Authorized by:   Briscoe Deutscher DO   Signed by:   Briscoe Deutscher DO on 10/14/2009   Method used:   Electronically to        [facility] [phone]* (retail)       [place], [place]  [address]       Ph: [phone]    Fax: [phone]   RxID:   [id number]

## 2010-09-15 NOTE — Consult Note (Signed)
Summary: [facility]  [facility]   Imported By: Audie Clear 04/08/2009 10:57:07  _____________________________________________________________________  External Attachment:    Type:   Image     Comment:   External Document

## 2010-09-15 NOTE — Letter (Signed)
Summary: Dr Leafy Kindle Truslow's Office Visit Note   Dr Leafy Kindle Truslow's Office Visit Note   Imported By: Sallee Provencal 03/11/2010 09:49:24  _____________________________________________________________________  External Attachment:    Type:   Image     Comment:   External Document

## 2010-09-15 NOTE — Consult Note (Signed)
Summary: Rheumatology  Rheumatology   Imported By: Raymond Gurney 07/31/2009 15:50:54  _____________________________________________________________________  External Attachment:    Type:   Image     Comment:   External Document

## 2010-09-15 NOTE — Progress Notes (Signed)
Summary: status of medication  Phone Note Call from Patient Call back at Home Phone [phone]   Reason for Call: Talk to Nurse Summary of Call: pt is checking status of her medication for zocor, pharmacy has already faxed, pt goes to [facility]  Initial call taken by: ERIN LEVAN,  February 16, 2007 10:21 AM  Follow-up for Phone Call        Rx Called In Follow-up by: Dixon Boos MD,  February 20, 2007 1:54 PM

## 2010-09-15 NOTE — Miscellaneous (Signed)
Summary: re: CPAP/TS  Clinical Lists Changes faxed the order and sleep study to [facility]. see under oders. Josh from [facility] called and reports, that the pt has a CPAP and it is on 'auto adjustment'. Does not know, why we faxed a new order. will ask Dr.Nikko Quast. [name] CMA,  Jan 05, 2010 5:13 PM  I was under the impression that this was the first and only order. Briscoe Deutscher DO  Jan 08, 2010 3:08 PM

## 2010-09-15 NOTE — Assessment & Plan Note (Signed)
Summary: lg blood blister on tip of toe/[place]   Vital Signs:  Patient profile:   60 year old female Weight:      214.6 pounds Temp:     97.1 degrees F Pulse rate:   102 / minute BP sitting:   122 / 86  (right arm)  Vitals Entered By: Geanie Cooley RN (March 12, 2009 11:03 AM) CC: left foot pain and blister Is Patient Diabetic? Yes  Pain Assessment Patient in pain? yes     Location: l foot Intensity: 6   Primary Care Provider:  Briscoe Deutscher DO  CC:  left foot pain and blister.  History of Present Illness: [name] is a 60 year old female presenting for blister on left 5th toe and foot pain after foot injury.  1. Foot pain: left, after stubbing on stove, felt pop, now with pain along lateral foot and digits 2-5, fifth toe swollen with blood-filled blister on dorsum, pain with movement, on coumadin therapy (last INR 2.4), no change in leg edema (chronic), no open wounds on foot.  Habits & Providers  Alcohol-Tobacco-Diet     Tobacco Status: quit  Allergies: 1)  ! Codeine 2)  ! Penicillin 3)  ! Sulfa 4)  ! * Lithium 5)  ! Valium 6)  ! * Cogentin  Review of Systems       per HPI, otherwise negative  Physical Exam  General:  Well-developed,well-nourished,in no acute distress; alert,appropriate and cooperative throughout examination.  Able to get up and down from exam table with slight assistance.  Not wearing O2. Extremities:  left foot with 2.5 cm blood-filled blister along 5th digit. no obvious deformities noted. 5th digit swollen with bruising along digits 2-5. ttp along lateral foot. decreased ROM due to pain.   Impression & Recommendations:  Problem # 1:  TOE PAIN (ICD-729.5) Assessment New Because patient on coumadin therapy with lateral toe/foot pain after injury, will check INR today and send for Xray to r/o foot fracture. Will not "pop" blister as patient would like today. Advised patient to let the blister resorb blood since opening it would increase  her risk of infection and bleeding. Provided gauze and tape in case in ruptures. Gave RED FLAGS for follow up if xray negative.  Orders: INR/PT-[facility] [id number]) [place]- Est Level  3 [id number])  Complete Medication List: 1)  Advair Diskus 500-50 Mcg/dose Misc (Fluticasone-salmeterol) .[name] Kitchen.. 1 puff inh two times a day 2)  Albuterol 90 Mcg/act Aers (Albuterol) .... Inhale 1 puff using inhaler four times a day 3)  Allopurinol 300 Mg Tabs (Allopurinol) .... Take 1  tablet by mouth twice a day per dr trueslow 4)  Ascensia Elite Test Strp (Glucose blood) .... Test blood sugar twice daily 5)  Darvocet-n 100 100-650 Mg Tabs (Propoxyphene n-apap) .... Take 1 tablet by mouth every six hours- needs appointment before any more refills 6)  Doxepin Hcl 100 Mg Caps (Doxepin hcl) .... Take 1 capsule by mouth at bedtime 7)  Epipen 2-pak 0.3 Mg/0.40ml (1:1000) Devi (Epinephrine hcl (anaphylaxis)) .... Inject 1 pen subcutaneously as directed 8)  Glipizide 10 Mg Tb24 (Glipizide) .... Take 1 tablet by mouth once a day 9)  Hydrochlorothiazide 25 Mg Tabs (Hydrochlorothiazide) .... Take 1 tablet by mouth twice a day 10)  Lasix 80 Mg Tabs (Furosemide) .... Take 1 tablet by mouth twice a day 11)  Lexapro 10 Mg Tabs (Escitalopram oxalate) .... Take 1 tablet by mouth once a day 12)  Metformin Hcl 500 Mg Tabs (Metformin  hcl) .... Take 1 tablet by mouth twice a day 13)  Potassium Chloride Crys Cr 20 Meq Tbcr (Potassium chloride crys cr) .... Take 3 tablet by mouth twice times a day and 2 tablets once daily 14)  Ranitidine Hcl 300 Mg Caps (Ranitidine hcl) .... Take 1 capsule by mouth once a day 15)  Spironolactone 50 Mg Tabs (Spironolactone) .... Take 1 tablet by mouth once a day 16)  Zocor 10 Mg Tabs (Simvastatin) .... Take 1 tablet by mouth once a day 17)  Nexium 40 Mg Cpdr (Esomeprazole magnesium) .[name] Kitchen.. 1 tablet by mouth twice daily 18)  Calcium 500 500 Mg Tabs (Calcium carbonate) .[name] Kitchen.. 1 tablet by mouth daily 19)  Methotrexate 2.5 Mg  Tabs (Methotrexate sodium) .... Take as directed by dr. Charlestine Night 20)  Oxybutynin Chloride 5 Mg Tabs (Oxybutynin chloride) .[name] Kitchen.. 1 tablet by mouth two times a day as directed 21)  Lyrica 100 Mg Caps (Pregabalin) .[name] Kitchen.. 1 tab by mouth three times a day 22)  Lantus 100 Unit/ml Soln (Insulin glargine) .... 42 units daily at bedtime 23)  Warfarin Sodium 5 Mg Tabs (Warfarin sodium) .... Take as directed 24)  Folic Acid 1 Mg Tabs (Folic acid) .[name] Kitchen.. 1 tablet by mouth daily while on methotrexate 25)  Clonidine Hcl 0.2 Mg Tabs (Clonidine hcl) .... 2 by mouth at bedtime for depression 26)  Amitriptyline Hcl 25 Mg Tabs (Amitriptyline hcl) .... Take one tablet at bedtime 27)  Prednisone 5 Mg Tabs (Prednisone) .[name] Kitchen.. 1 daily by dr Jolee Ewing 28)  Ferrous Sulfate 325 (65 Fe) Mg Tbec (Ferrous sulfate) .... 3x per day 29)  Doxepin Hcl 25 Mg Caps (Doxepin hcl) .... Take 1 capsule by mouth once a day at bedtime 30)  Propoxyphene N-apap 100-650 Mg Tabs (Propoxyphene n-apap) .... At bedtime  Other Orders: Diagnostic X-Ray/Fluoroscopy (Diagnostic X-Ray/Flu)  Patient Instructions: 1)  I am sending you to get an xray of your foot today to see if it is broken.   ANTICOAGULATION RECORD PREVIOUS REGIMEN & LAB RESULTS Anticoagulation Diagnosis:  PE on  11/22/2008 Previous INR Goal Range:  2-3 on  11/22/2008 Previous INR:  2.4 on  03/07/2009 Previous Coumadin Dose(mg):  5mg  tablets on  11/25/2008 Previous Regimen:  continue same:  5 mg - M, W, F;  2.5 mg - other days on  03/07/2009 Previous Coagulation Comments:  f/u hospital and waiting for INR to be low enough to drain fluid from lungs on  12/27/2008  NEW REGIMEN & LAB RESULTS Current INR: 1.4 Regimen: continue same:  5 mg - M, W, F;  2.5 mg - other days Coagulation Comments: pt probably missed one dose, also ate green vegetables Repeat testing in: 1 week Other Comments: ...............test performed by......[name] KitchenBonnie A. [place], [place] (ASCP)   Dose has been reviewed  with patient or caretaker during this visit. Reviewed by: Dr. Juleen China   Appended Document: lg blood blister on tip of toe/[place] XRAY: Probable acute fracture of the distal aspect of the proximal phalanx of the fifth toe. Discussed with preceptor, Dr. Nori Riis. No treatment necessary. Patient informed. Follow up one week for INR.

## 2010-09-15 NOTE — Consult Note (Signed)
Summary: Dr. Charlestine Night  Dr. Charlestine Night   Imported By: Loree Fee KIVETT 01/06/2007 14:30:57  _____________________________________________________________________  External Attachment:    Type:   Image     Comment:   External Document

## 2010-09-15 NOTE — Miscellaneous (Signed)
Summary: visit note from memr- 10/11/06  CC: follow-up, legs draining fluid  HPI: [name] notes that the edema in her legs has worsened recently.  Dr. Charlestine Night has been tapering her prednisone, and the swelling has worsened since.  Swelling is better in the morning and worse as the day progresses.  She has noted clear fluid coming from her lower extremities on both sides.  No open wounds.  This is not the first time it has happened to her.  Her legs are painful.  She has been checking her sugars.  She brought in her log book and her fasting sugars are 120s to 130s with occasionally 300-400.  She does not relate this high sugars to a heavy meal prior.  She feels bad when her sugars are this high.  She has recently had 3 toenails removed for ingrown toenails by [facility].  She missed her PFT appointment and wants that rescheduled.  She has been taking her Prempro daily as she could not tolerate the sweats she experienced when trying to wean to every other day.    Of note, she has been started on methotrexate by Dr. Charlestine Night for her chronic joint/leg pain in an effort to begin weaning steroids.  Recently seen in clinic for dysuria, dx'd with external yeast infxn and has been using topical nystatin for 2 weeks.  No relief in dysuria.  Vitals noted.  BP 114/73  Gen- pleasant, NAD, Cushingoid appearance Neck- supple, no thyromegaly, no LAD CV- RRR, no murmur lungs- CTAB, no wheezes Abd- obese ext- 2+ pitting edema to knees bilaterally.   No open sores.  Clear drainage running down right leg noted.  Skin- chronic venous changes on LE's bilaterally  A/P: 60 yo with multiple medical problems.   1.  Edema- etiology unknown, although likely venous insufficiency.  She had an echo in 5/05 that was grossly normal, but EF unable to be evaluated.  She reports that Dr. Doylene Canard did an echo and stress test last fall during her evaluation for tachycardia that were both normal.  Will try to obtain  records from [place].  If no echo done, will consider repeat echo, as she is certainly at risk for CHF as a cause for LE edema.  She is on numerous diuretics.  I instructed her to keep her legs elevated as much as possible, which should help since the edema is better after getting out of bed.  I also encouraged her to try thigh-high support hose.  She was unable to tolerate knee-high ones because the edema collected around her knees.  RTC for signs of infection, open sore, fever, etc.  2.  DM- secondary to chronic steroids?  Will check A1C at next visit.  If A1C continues to creep up, would consider increasing glipizide dose.  Already followed by podiatrist and has eye doctor.  3.  HTN- good control today.  Cont current meds.  4.  Menopausal symptoms-  pt started prempro daily, wean failed.  Pt understands risks of chronic estrogen use including increased risk of clots, stroke and some cancers.  She is willing to accept these risks for imporved quality of life.  5.  Rheum- pt followed by dr. Charlestine Night for chronic joint/leg pain.  She has stopped colchicine and allopurinol, as he did not think it was gout.  She has done well after stopping these meds.  She is in the process of starting MTX and tapering steroids.  She knows the many side effects of chronic steroids- weight  gain, osteopenia, diabetes, immune deficiency.  6.  Dysuria- will prescripe vaginal anti-fungal (Monistat 7) as persistent infection may be the cause for her dysuria.  She hs been using topical nystatin without relief.  7.  Pulm- pt with hospitalization for asthma.  On albuterol prn plus advair.  No history of smoking.  Will reschedule baseline PFT's.  F/u in 2 months.  HbA1C at that time.

## 2010-09-15 NOTE — Assessment & Plan Note (Signed)
Summary: 3 month rov/sl   Primary [name]:  Briscoe Deutscher DO  CC:  3 month rov and patient is now on blood thinners so she reports that she has her good days and bad days.  She has been swelling.  History of Present Illness: 60 yo with multiple medical problems including rheumatoid arthritis, obesity-hypoventilation syndrome, and diastolic CHF who presents for followup of pericarditis episode.  Patient had a PE in 4/10 (? trigger).  She was admitted in 5/10 with pleuritic chest pain.  CTA chest did not show new PE or expansion of prior PE. She did have a left pleural effusion.  Echo showed a small loculated pericardial effusion.  There was some evidence for ventricular interdependence suggesting a degree of constrictive physiology.  It was thought that she had acute pericarditis/pleuritis, probably predisposed by her rheumatoid arthritis.  ESR at the time was 108.  Patient was treated with high dose ASA and colchicine.  I had wanted her on colchicine for 3 months post-episode to decrease risk of pericarditis recurrence but apparently it was stopped soon after discharge.  Since discharge, she has good days and bad days.  Her main limitation seems to be her diffuse joint pains from rheumatoid arthritis.  She is not short of breath walking around her house.  She does get short of breath walking to her mailbox and back, which is about 1 block in distance.  She denies orthopnea but sleeps on a wedge because of GERD.  No chest pain.    ECG:  NSR, RBBB, LAFB, LVH  Labs (5/10): ESR 108, creatinine 1.12  Current Medications (verified): 1)  Advair Diskus 500-50 Mcg/dose Misc (Fluticasone-Salmeterol) .[name] Kitchen.. 1 Puff Inh Two Times A Day 2)  Albuterol 90 Mcg/act Aers (Albuterol) .... Inhale 1 Puff Using Inhaler Four Times A Day 3)  Allopurinol 300 Mg Tabs (Allopurinol) .... Take 1  Tablet By Mouth Twice A Day Per Dr Jolee Ewing 4)  Ascensia Elite Test  Strp (Glucose Blood) .... Test Blood Sugar Twice Daily 5)   Darvocet-N 100 100-650 Mg Tabs (Propoxyphene N-Apap) .... Take 1 Tablet By Mouth Every Six Hours- Needs Appointment Before Any More Refills 6)  Doxepin Hcl 100 Mg Caps (Doxepin Hcl) .... Take 1 Capsule By Mouth At Bedtime 7)  Epipen 2-Pak 0.3 Mg/0.26ml (1:1000) Devi (Epinephrine Hcl (Anaphylaxis)) .... Inject 1 Pen Subcutaneously As Directed 8)  Glipizide 10 Mg Tb24 (Glipizide) .... Take 1 Tablet By Mouth Once A Day 9)  Lasix 80 Mg Tabs (Furosemide) .... Take 1 Tablet By Mouth Twice A Day 10)  Lexapro 10 Mg Tabs (Escitalopram Oxalate) .... Take 1 Tablet By Mouth Once A Day 11)  Metformin Hcl 500 Mg Tabs (Metformin Hcl) .... Take 1 Tablet By Mouth Twice A Day 12)  Potassium Chloride Crys Cr 20 Meq Tbcr (Potassium Chloride Crys Cr) .... Take 2 Tabs By Mouth in The Am, 3 At Noon, and 3 At [facility] 13)  Ranitidine Hcl 300 Mg Caps (Ranitidine Hcl) .... Take 1 Capsule By Mouth Once A Day 14)  Spironolactone 50 Mg Tabs (Spironolactone) .... Take 1 Tablet By Mouth Once A Day 15)  Zocor 10 Mg Tabs (Simvastatin) .... Take 1 Tablet By Mouth Once A Day 16)  Nexium 40 Mg  Cpdr (Esomeprazole Magnesium) .[name] Kitchen.. 1 Tablet By Mouth Twice Daily 17)  Calcium 500 500 Mg  Tabs (Calcium Carbonate) .[name] Kitchen.. 1 Tablet By Mouth Daily 18)  Methotrexate 2.5 Mg  Tabs (Methotrexate Sodium) .... Take As Directed By Dr. Charlestine Night 19)  Oxybutynin Chloride 5 Mg  Tabs (Oxybutynin Chloride) .[name] Kitchen.. 1 Tablet By Mouth Two Times A Day As Directed 20)  Lyrica 100 Mg  Caps (Pregabalin) .[name] Kitchen.. 1 Tab By Mouth Three Times A Day 21)  Lantus 100 Unit/ml Soln (Insulin Glargine) .... 42 Units Daily At Bedtime 22)  Warfarin Sodium 5 Mg Tabs (Warfarin Sodium) .... Take As Directed 23)  Folic Acid 1 Mg Tabs (Folic Acid) .[name] Kitchen.. 1 Tablet By Mouth Daily While On Methotrexate 24)  Clonidine Hcl 0.2 Mg Tabs (Clonidine Hcl) .... 2 By Mouth At Bedtime For Depression 25)  Amitriptyline Hcl 25 Mg Tabs (Amitriptyline Hcl) .... Take One Tablet At Bedtime 26)  Prednisone 5 Mg Tabs  (Prednisone) .[name] Kitchen.. 1 Daily By Dr Jolee Ewing 27)  Ferrous Sulfate 325 (65 Fe) Mg  Tbec (Ferrous Sulfate) .... 3x Per Day 28)  Doxepin Hcl 25 Mg Caps (Doxepin Hcl) .... Take 1 Capsule By Mouth Once A Day At Bedtime 29)  Propoxyphene N-Apap 100-650 Mg Tabs (Propoxyphene N-Apap) .... At Bedtime 30)  Iron 325 (65 Fe) Mg Tabs (Ferrous Sulfate) .... One By Mouth Three Times A Day 31)  Hydrochlorothiazide 25 Mg Tabs (Hydrochlorothiazide) .[name] Kitchen.. 1 Tab Two Times A Day  Allergies (verified): 1)  ! Codeine 2)  ! Penicillin 3)  ! Sulfa 4)  ! * Lithium 5)  ! Valium 6)  ! * Cogentin 7)  ! Lisinopril (Lisinopril) 8)  ! * Hyddal  Past History:  Past Medical History: 1. amenorrhea since 60 years old 2. Barretts Esophagus - EGD 8/07. (Repeat 2-3 years) 3. Nephrolithiasis 2/03 4. Colonoscopy- benign polyps (12/2000), repeat colonoscopy performed in 2007 5. Endometrial biopsy 10/03 benign columnar mucosa (limited material) 6. Asthma: PFT's (10/14/06)- mild obstructive airway dz, poor effort 7. Obesity-hypoventilation syndrome: uses CPAP at night. 8. Obesity 9. Diastolic CHF: EF [id number] on echo (5/10) 10.  Pulmonary embolus (4/10): ? trigger.  11.  Acute pericarditis/pleuritis (5/10): Echo (5/10) showed EF 55%, possible posterior HK, > 25% respiratory variation of mitral inflow suggesting some interventricular interdependence, IVC not significantly dilated.  There was a small organized pericardial effusion and a moderate left pleural effusion. Some evidence suggesting constrictive physiology.  ESR was 108. Patient treated with high dose ASA and colchicine.  12. CKD: last creatinine 1.12 13. Chronic anemia 14. Diabetes mellitus type II 15. gout 16. Rheumatoid arthritis (followed by Dr. Charlestine Night). 17. depression 18. GERD 19. HTN 20. Hyperlipidemia 21. ? schizophrenia  Family History: Reviewed history from 10/13/2006 and no changes required. 6 brothers and 6 sisters., Father died of black lung, Mother with  bone cancer, One brother with lung cancer and another with` blood` cancer., One sister died of colon cancer., Strong FH of DM, CAD, and kidney problems  Social History: Reviewed history from 10/13/2006 and no changes required. Divorced for greater than 30 years.  Born in [place] but lived in [place] for most of her life. No children. Stopped smoking in 1973, no etoh, no drug use.  Pt was adopted.  Not working secondary to schizophrenia.  Niece, Helene Kelp, helps care for her.  Not sexually active.  Review of Systems       All systems reviewed and negative except as per HPI.   Vital Signs:  Patient profile:   60 year old female Height:      60 inches Weight:      211 pounds BMI:     41.36 Pulse rate:   96 / minute Pulse rhythm:   regular BP sitting:  122 / 80  (left arm) Cuff size:   large  Vitals Entered By: Doug Sou CMA (April 25, 2009 10:37 AM)  Physical Exam  General:  Chronically-ill appearing, NAD Neck:  JVP difficult to assess due to thick neck. No masses, thyromegaly or abnormal cervical nodes. Lungs:  Clear bilaterally to auscultation and percussion. Heart:  Non-displaced PMI, chest non-tender; regular rate and rhythm, S1, S2 without murmurs, rubs or gallops. Carotid upstroke normal, no bruit.  Pedals normal pulses. 1+ ankle edema.  Abdomen:  Bowel sounds positive; abdomen soft and non-tender without masses, organomegaly, or hernias noted. No hepatosplenomegaly. Obese.  Extremities:  No clubbing or cyanosis. Neurologic:  Alert and oriented x 3. Psych:  Normal affect.   Impression & Recommendations:  Problem # 1:  PULMONARY EMBOLISM (ICD-415.19) Patient is on warfarin for PE.  I do not think that there was a definite trigger so would favor continuing warfarin long-term.    Problem # 2:  PERICARDITIS (ICD-423.9) Patient had an episode of acute pericarditis/pleuritis that may be linked to her rheumatoid arthritis.  Echo showed a small loculated effusion with some  interventricular interdependence suggesting developing constrictive physiology.  She has had no further chest pain and her shortness of breath is no worse than it had been at baseline prior.  I will repeat an echocardiogram to make sure that there is no further evidence for development of constrictive pericarditis.   Problem # 3:  DIASTOLIC HEART FAILURE, CHRONIC (ICD-428.32) Stable NYHA class III symptoms.  Her shortness of breath is likely multifactorial, related to diastolic CHF, deconditioning, obesity-hypoventilation syndrome.  I will continue her on her current dose of Lasix.  She has a very extensive medication list and I noticed after she left that she is on both lasix and HCTZ.  I think that it would be reasonable to stop the HCTZ and follow her BP.  I will leave this up to her PCP.    Ms Whitefield will followup with me in 3 months.   Other Orders: EKG w/ Interpretation (93000) Echocardiogram (Echo)  Patient Instructions: 1)  Your physician recommends that you schedule a follow-up appointment in: 3 months 2)  Your physician recommends that you continue on your current medications as directed. Please refer to the Current Medication list given to you today. 3)  Your physician has requested that you have an echocardiogram.  Echocardiography is a painless test that uses sound waves to create images of your heart. It provides your doctor with information about the size and shape of your heart and how well your heart's chambers and valves are working.  This procedure takes approximately one hour. There are no restrictions for this procedure.

## 2010-09-15 NOTE — Assessment & Plan Note (Signed)
Summary: h/fup,tcb   Vital Signs:  Patient profile:   60 year old female Height:      61 inches Weight:      213 pounds BMI:     40.39 Temp:     97.9 degrees F oral Pulse rate:   96 / minute BP sitting:   127 / 80  (right arm) Cuff size:   regular  Vitals Entered By: Schuyler Amor CMA (September 24, 2009 11:09 AM) CC: diabetes, edema, dysuria Is Patient Diabetic? Yes Pain Assessment Patient in pain? no        Primary Care Provider:  Briscoe Deutscher [id number]  CC:  diabetes, edema, and dysuria.  History of Present Illness:  1. Diabetes: Uncontrolled. February CBGs improved from [place]. Currently on Lantus 50 units daily and Novolog 15 units three times daily. Pt also on Prednisone 5 mg for > 6 months for gout.    2. Edema:  Spironolactone was increased to 100 mg and HCTZ was DC'ed in the hospital due to low postassium observed prior to discharge.  Pt gained 10 lbs from time of hospital discharge to presentation to the pharmacy clinic on 2/8. She was instructed to restart the HCTZ. She took 3 doses prior to this visit one day later. Her weight decreased from 221 to 213.  3. Dysuria: x several days, with suprabubic pressure, urinary frequency. Denies back pain, hematuria. Hx of urinary retention while in the hospital. oxybutynin and amitriptyline were stopped before DC.       Habits & Providers  Alcohol-Tobacco-Diet     Tobacco Status: quit  Current Medications (verified): 1)  Advair Diskus 500-50 Mcg/dose Misc (Fluticasone-Salmeterol) .[name] Kitchen.. 1 Puff Inh Two Times A Day 2)  Allopurinol 300 Mg Tabs (Allopurinol) .... Take 1  Tablet By Mouth Twice A Day Per Dr Jolee Ewing 3)  Ascensia Elite Test  Strp (Glucose Blood) .... Test Blood Sugarfour Times Daily 4)  Doxepin Hcl 100 Mg Caps (Doxepin Hcl) .... Take 1 Capsule By Mouth At Bedtime 5)  Epipen 2-Pak 0.3 Mg/0.15ml (1:1000) Devi (Epinephrine Hcl (Anaphylaxis)) .... Inject 1 Pen Subcutaneously As Directed 6)  Lasix 80 Mg Tabs  (Furosemide) .... Take 1 Tablet By Mouth Twice A Day 7)  Lexapro 10 Mg Tabs (Escitalopram Oxalate) .... Take 1 Tablet By Mouth Once A Day 8)  Metformin Hcl 500 Mg Tabs (Metformin Hcl) .... Take 1 Tablet By Mouth Twice A Day 9)  Potassium Chloride Crys Cr 20 Meq Tbcr (Potassium Chloride Crys Cr) .... Take 4  Tabs By Mouth in The Am, 4 At Noon, and 4 At [facility] 10)  Ranitidine Hcl 300 Mg Caps (Ranitidine Hcl) .... Take 1 Capsule By Mouth Once A Day 11)  Zocor 10 Mg Tabs (Simvastatin) .... Take 1 Tablet By Mouth Once A Day 12)  Nexium 40 Mg  Cpdr (Esomeprazole Magnesium) .[name] Kitchen.. 1 Tablet By Mouth Twice Daily 13)  Calcium 500 500 Mg  Tabs (Calcium Carbonate) .[name] Kitchen.. 1 Tablet By Mouth Daily 14)  Methotrexate 2.5 Mg  Tabs (Methotrexate Sodium) .... Take As Directed By Dr. Charlestine Night 12.5 Mg (5 Pills) On Saturday and Sunday 15)  Lyrica 100 Mg  Caps (Pregabalin) .[name] Kitchen.. 1 Tab By Mouth Three Times A Day 16)  Lantus 100 Unit/ml Soln (Insulin Glargine) .... 50 Units Daily At Bedtime. Dispense One Month Supply. 17)  Warfarin Sodium 5 Mg Tabs (Warfarin Sodium) .... Take As Directed 18)  Folic Acid 1 Mg Tabs (Folic Acid) .[name] Kitchen.. 1 Tablet By Mouth Daily While On  Methotrexate 19)  Clonidine Hcl 0.2 Mg Tabs (Clonidine Hcl) .... 2 By Mouth At Bedtime For Depression 20)  Doxepin Hcl 25 Mg Caps (Doxepin Hcl) .... Take 1 Capsule By Mouth Once A Day At Bedtime 21)  Iron 325 (65 Fe) Mg Tabs (Ferrous Sulfate) .... One By Mouth Three Times A Day 22)  Novolog 100 Unit/ml Soln (Insulin Aspart) .[name] Kitchen.. 15 Units Subcutaneous Injection  Prior To Breakfast and Lunch and Eveing Meal.  Disp: 1 Month Supply. 23)  Vitamin D 1000 Unit Tabs (Cholecalciferol) .... Take 1 Tabs Daily 24)  Bd Insulin Syringe Ultrafine 30g X 1/2" 1 Ml Misc (Insulin Syringe-Needle U-100) .... Dispense Supply For Four Times Per Day Dosing of Insulin. 25)  Hydrocodone-Acetaminophen 5-325 Mg Tabs (Hydrocodone-Acetaminophen) .[name] Kitchen.. 1 By Mouth Up To 4 Times Per Day As Needed For  Pain 26)  Ventolin Hfa 108 (90 Base) Mcg/act Aers (Albuterol Sulfate) .... Take One Puff Q4 Hours. 27)  Spironolactone 100 Mg Tabs (Spironolactone) .... Once Daily in Am 28)  Prednisone 5 Mg Tabs (Prednisone) .... Once Daily 29)  Macrobid 100 Mg Caps (Nitrofurantoin Monohyd Macro) .... One By Mouth Two Times A Day X 7 Days  Allergies (verified): 1)  ! Codeine 2)  ! Penicillin 3)  ! Sulfa 4)  ! * Lithium 5)  ! Valium 6)  ! * Cogentin 7)  ! Lisinopril (Lisinopril) 8)  ! * Hyddal 9)  ! Diphenhydramine Hcl (Diphenhydramine Hcl) 10)  Thioridazine Hcl (Thioridazine Hcl)  Past History:  Past medical, surgical, family and social histories (including risk factors) reviewed for relevance to current acute and chronic problems.  Past Medical History: Reviewed history from 09/16/2009 and no changes required. 1. Amenorrhea since 60 years old 2. Barretts Esophagus - EGD 8/07. (Repeat 2-3 years) 3. Nephrolithiasis 2/03 4. Colonoscopy- benign polyps (12/2000), repeat colonoscopy performed in 2007 5. Endometrial biopsy 10/03 benign columnar mucosa (limited material) 6. Asthma: PFT's (10/14/06)- mild obstructive airway dz, poor effort 7. Obesity-hypoventilation syndrome: uses CPAP at night. 8. Obesity 9. Diastolic CHF: EF [id number] on echo (5/10), repeat ECHO (08/29/09) EF 0000000, Grade 1 Diastolic Dsfxn, Lt atrium mildly dilated, PA systolic pressure mildly elevated. 10.  Pulmonary embolus (4/10): ? trigger--on Warfarin 11.  Acute pericarditis/pleuritis (5/10): Echo (5/10) showed EF 55%, possible posterior HK, > 25% respiratory variation of mitral inflow suggesting some interventricular interdependence, IVC not significantly dilated.  There was a small organized pericardial effusion and a moderate left pleural effusion. Some evidence suggesting constrictive physiology.  ESR was 108. Patient treated with high dose ASA and colchicine.  12. CKD: last creatinine 1.12 13. Chronic anemia 14. Diabetes mellitus  type II 15. Gout 16. Rheumatoid arthritis (followed by Dr. Charlestine Night). 17. Depression 18. GERD 19. HTN 20. Hyperlipidemia 21. Schizophrenia 22. Polypharmacy 23. Falls 24. Hypokalemia 25. Vitamin D 22 (10/6) 26. Fibromyalgia  Past Surgical History: Reviewed history from 05/21/2009 and no changes required. Appendectomy - 08/28/2001 Bladder stretching - 08/28/2001 Cholecystectomy - 08/28/2001 B/L knee replacement- Sept & Nov 2008 (Dr. Alvan Dame)  Family History: Reviewed history from 04/25/2009 and no changes required. 6 brothers and 6 sisters., Father died of black lung, Mother with bone cancer, One brother with lung cancer and another with` blood` cancer., One sister died of colon cancer., Strong FH of DM, CAD, and kidney problems  Social History: Reviewed history from 09/16/2009 and no changes required. Divorced for greater than 30 years.  Born in [place] but lived in [place] for most of her life. No children. Stopped smoking  in 1973, no etoh, no drug use.  Pt was adopted.  Not working secondary to schizophrenia.  Live with mom & niece Helene Kelp who helps care for her).  Not sexually active.Smoking Status:  quit  Review of Systems General:  Denies chills and fever. CV:  Denies chest pain or discomfort. Resp:  Denies shortness of breath. GI:  Denies change in bowel habits. GU:  Complains of dysuria and urinary frequency; denies hematuria, incontinence, and urinary hesitancy. Endo:  Complains of excessive thirst, excessive urination, and weight change.  Physical Exam  General:  Well-developed, well-nourished, in no acute distress; alert, appropriate and cooperative throughout examination. Vitals reviewed. Lungs:  CTAB Heart:  RRR no m/r/g Abdomen:  soft, + BS, suprapubic tenderness Extremities:  No edema. Psych:  Oriented X3, memory intact for recent and remote, normally interactive, and good eye contact.     Impression & Recommendations:  Problem # 1:  DIABETES MELLITUS, TYPE I,  UNCONTROLLED (ICD-250.03) Assessment Improved Lantus increased to 55 units daily today. Continue CBG log QID. Will recheck and adjust in 2 weeks. Patient to f/u in pharmacy clinic as well for evaluation and adjustment. Her updated medication list for this problem includes:    Metformin Hcl 500 Mg Tabs (Metformin hcl) .[name] Kitchen... Take 1 tablet by mouth twice a day    Lantus 100 Unit/ml Soln (Insulin glargine) .[name] KitchenMarland KitchenMarland KitchenMarland Kitchen 50 units daily at bedtime. dispense one month supply.    Novolog 100 Unit/ml Soln (Insulin aspart) .[name] KitchenMarland KitchenMarland KitchenMarland Kitchen 15 units subcutaneous injection  prior to breakfast and lunch and eveing meal.  disp: 1 month supply.  Orders: [place]- Est  Level 4 [id number])  Problem # 2:  LEG EDEMA, BILATERAL (ICD-782.3) Assessment: Improved Patient lost 8 pounds since yesterday by restarting HCTZ 25 two times a day. Recheck BMP for K and Cr. Check BNP.  Her updated medication list for this problem includes:    Lasix 80 Mg Tabs (Furosemide) .[name] Kitchen... Take 1 tablet by mouth twice a day    Spironolactone 100 Mg Tabs (Spironolactone) ..... Once daily in am  Orders: B Nat Peptide-[facility] [phone]) [place]- Est  Level 4 [phone])  Problem # 3:  DYSURIA (ICD-788.1) Assessment: New UA and s/s c/w UTI. Will treat. Patient is allergic to PCN and Sulfa medications. Will not use flouroquinolone as patient on coumadin. Her updated medication list for this problem includes:    Macrobid 100 Mg Caps (Nitrofurantoin monohyd macro) ..... One by mouth two times a day x 7 days  Orders: Urinalysis-[facility] (00000) Urine Culture-[facility] [id number]) [place]- Est  Level 4 [id number])  Problem # 4:  COUMADIN THERAPY (ICD-V58.61) Assessment: Unchanged  Orders: INR/PT-[facility] [id number]) [place]- Est  Level 4 [id number])  Complete Medication List: 1)  Advair Diskus 500-50 Mcg/dose Misc (Fluticasone-salmeterol) .[name] Kitchen.. 1 puff inh two times a day 2)  Allopurinol 300 Mg Tabs (Allopurinol) .... Take 1  tablet by mouth twice a day per dr trueslow 3)  Ascensia Elite Test Strp  (Glucose blood) .... Test blood sugarfour times daily 4)  Doxepin Hcl 100 Mg Caps (Doxepin hcl) .... Take 1 capsule by mouth at bedtime 5)  Epipen 2-pak 0.3 Mg/0.12ml (1:1000) Devi (Epinephrine hcl (anaphylaxis)) .... Inject 1 pen subcutaneously as directed 6)  Lasix 80 Mg Tabs (Furosemide) .... Take 1 tablet by mouth twice a day 7)  Lexapro 10 Mg Tabs (Escitalopram oxalate) .... Take 1 tablet by mouth once a day 8)  Metformin Hcl 500 Mg Tabs (Metformin hcl) .... Take 1 tablet by mouth twice a day 9)  Potassium Chloride Crys Cr 20 Meq Tbcr (Potassium chloride crys cr) .... Take 4  tabs by mouth in the am, 4 at noon, and 4 at hs 10)  Ranitidine Hcl 300 Mg Caps (Ranitidine hcl) .... Take 1 capsule by mouth once a day 11)  Zocor 10 Mg Tabs (Simvastatin) .... Take 1 tablet by mouth once a day 12)  Nexium 40 Mg Cpdr (Esomeprazole magnesium) .[name] Kitchen.. 1 tablet by mouth twice daily 13)  Calcium 500 500 Mg Tabs (Calcium carbonate) .[name] Kitchen.. 1 tablet by mouth daily 14)  Methotrexate 2.5 Mg Tabs (Methotrexate sodium) .... Take as directed by dr. Charlestine Night 12.5 mg (5 pills) on saturday and sunday 15)  Lyrica 100 Mg Caps (Pregabalin) .[name] Kitchen.. 1 tab by mouth three times a day 16)  Lantus 100 Unit/ml Soln (Insulin glargine) .... 50 units daily at bedtime. dispense one month supply. 17)  Warfarin Sodium 5 Mg Tabs (Warfarin sodium) .... Take as directed 18)  Folic Acid 1 Mg Tabs (Folic acid) .[name] Kitchen.. 1 tablet by mouth daily while on methotrexate 19)  Clonidine Hcl 0.2 Mg Tabs (Clonidine hcl) .... 2 by mouth at bedtime for depression 20)  Doxepin Hcl 25 Mg Caps (Doxepin hcl) .... Take 1 capsule by mouth once a day at bedtime 21)  Iron 325 (65 Fe) Mg Tabs (Ferrous sulfate) .... One by mouth three times a day 22)  Novolog 100 Unit/ml Soln (Insulin aspart) .[name] Kitchen.. 15 units subcutaneous injection  prior to breakfast and lunch and eveing meal.  disp: 1 month supply. 23)  Vitamin D 1000 Unit Tabs (Cholecalciferol) .... Take 1 tabs daily 24)   Bd Insulin Syringe Ultrafine 30g X 1/2" 1 Ml Misc (Insulin syringe-needle u-100) .... Dispense supply for four times per day dosing of insulin. 25)  Hydrocodone-acetaminophen 5-325 Mg Tabs (Hydrocodone-acetaminophen) .[name] Kitchen.. 1 by mouth up to 4 times per day as needed for pain 26)  Ventolin Hfa 108 (90 Base) Mcg/act Aers (Albuterol sulfate) .... Take one puff q4 hours. 27)  Spironolactone 100 Mg Tabs (Spironolactone) .... Once daily in am 28)  Prednisone 5 Mg Tabs (Prednisone) .... Once daily 29)  Macrobid 100 Mg Caps (Nitrofurantoin monohyd macro) .... One by mouth two times a day x 7 days  Other Orders: Basic Met-[facility] [id number])  Patient Instructions: 1)  It was nice to see you today! 2)  Increase your Lantus to 55 units each night.  3)  You have a UTI. I am prescribing Macrobid for this. Prescriptions: MACROBID 100 MG CAPS (NITROFURANTOIN MONOHYD MACRO) one by mouth two times a day x 7 days  #14 x 0   Entered and Authorized by:   Briscoe Deutscher DO   Signed by:   Briscoe Deutscher DO on 09/24/2009   Method used:   Print then Give to Patient   RxID:   [id number]   Laboratory Results   Urine Tests  Date/Time Received: September 24, 2009 12:05 PM  Date/Time Reported: September 24, 2009 12:09 PM   Routine Urinalysis   Color: yellow Appearance: Clear Glucose: negative   (Normal Range: Negative) Bilirubin: negative   (Normal Range: Negative) Ketone: negative   (Normal Range: Negative) Spec. Gravity: 1.010   (Normal Range: 1.003-1.035) Blood: negative   (Normal Range: Negative) pH: 7.0   (Normal Range: 5.0-8.0) Protein: negative   (Normal Range: Negative) Urobilinogen: 0.2   (Normal Range: 0-1) Nitrite: negative   (Normal Range: Negative) Leukocyte Esterace: moderate   (Normal Range: Negative)  Urine Microscopic WBC/HPF: 1-5 RBC/HPF: 0-2 Bacteria/HPF:  2+ Epithelial/HPF: 5-10    Comments: in and out cath specimen ...........test performed by...........[name] KitchenHedy Camara,  CMA   Blood Tests      INR: 2.0   (Normal Range: 0.88-1.12   Therap INR: 2.0-3.5)     ANTICOAGULATION RECORD PREVIOUS REGIMEN & LAB RESULTS Anticoagulation Diagnosis:  PE on  11/22/2008 Previous INR Goal Range:  2-3 on  11/22/2008 Previous INR:  1.8 on  09/19/2009 Previous Coumadin Dose(mg):  5mg  tablets on  11/25/2008 Previous Regimen:  5 mg - M, W, F, Sat;   2.5 mg - other days on  09/19/2009 Previous Coagulation Comments:  pt was in hospital this week - discharged yesterday on  09/19/2009  NEW REGIMEN & LAB RESULTS Current INR: 2.0 Regimen: continue:  5 mg - M, W, F, Sat;  2.5 mg - other days  Provider: Juleen China Repeat testing in: 2 weeks  10-08-09 Other Comments: ...............test performed by......[name] KitchenBonnie A. Martinique, MLS (ASCP)cm   Dose has been reviewed with patient or caretaker during this visit. Reviewed by: Barkley Bruns (ASCP)cm    Prevention & Chronic Care Immunizations   Influenza vaccine: Fluvax 3+  (05/21/2009)   Influenza vaccine due: 06/19/2008    Tetanus booster: 08/16/2001: Done.   Tetanus booster due: 08/17/2011    Pneumococcal vaccine: Done.  (09/16/2004)   Pneumococcal vaccine due: None  Colorectal Screening   Hemoccult: Done.  (12/14/2000)   Hemoccult due: Not Indicated    Colonoscopy: Done.  (03/16/2006)   Colonoscopy due: 03/16/2016  Other Screening   Pap smear: Done.  (08/16/2006)   Pap smear action/deferral: Deferred  (05/21/2009)   Pap smear due: 08/16/2009    Mammogram: ASSESSMENT: Negative - BI-RADS 1^MM DIGITAL SCREENING  (03/25/2009)   Mammogram action/deferral: Not indicated  (09/11/2009)   Mammogram due: 03/25/2010   Smoking status: quit  (09/24/2009)  Diabetes Mellitus   HgbA1C: 10.3  (08/18/2009)   Hemoglobin A1C due: 09/30/2007    Eye exam: Not documented    Foot exam: yes  (07/04/2008)   Foot exam action/deferral: Do today   High risk foot: Yes  (09/11/2009)   Foot care education: Done  (09/11/2009)   Foot  exam due: 05/03/2008    Urine microalbumin/creatinine ratio: Not documented    Diabetes flowsheet reviewed?: Yes   Progress toward A1C goal: Unchanged  Lipids   Total Cholesterol: 169  (06/30/2009)   LDL: 49  (06/30/2009)   LDL Direct: 96  (02/28/2008)   HDL: 43  (06/30/2009)   Triglycerides: 384  (06/30/2009)    SGOT (AST): 19  (05/21/2009)   SGPT (ALT): 39  (05/21/2009)   Alkaline phosphatase: 99  (05/21/2009)   Total bilirubin: 0.5  (05/21/2009)    Lipid flowsheet reviewed?: Yes   Progress toward LDL goal: Unchanged  Hypertension   Last Blood Pressure: 127 / 80  (09/24/2009)   Serum creatinine: 0.90  (08/29/2009)   Serum potassium 3.5  (08/29/2009)    Hypertension flowsheet reviewed?: Yes   Progress toward BP goal: Unchanged  Self-Management Support :   Personal Goals (by the next clinic visit) :     Personal A1C goal: 8  (07/13/2009)     Personal blood pressure goal: 130/80  (05/21/2009)     Personal LDL goal: 100  (05/21/2009)    Patient will work on the following items until the next clinic visit to reach self-care goals:     Medications and monitoring: take my medicines every day  (09/24/2009)     Eating: drink diet soda or water  instead of juice or soda, eat more vegetables, use fresh or frozen vegetables, eat foods that are low in salt, eat baked foods instead of fried foods, eat fruit for snacks and desserts, limit or avoid alcohol  (09/24/2009)     Activity: take a 30 minute walk every day  (09/24/2009)    Diabetes self-management support: CBG self-monitoring log, Written self-care plan  (09/24/2009)   Diabetes care plan printed    Hypertension self-management support: Written self-care plan  (09/24/2009)   Hypertension self-care plan printed.    Hypertension self-management support not done because: Good outcomes  (09/23/2009)    Lipid self-management support: Written self-care plan  (09/24/2009)   Lipid self-care plan printed.    Lipid self-management  support not done because: Good outcomes  (09/23/2009)

## 2010-09-15 NOTE — Assessment & Plan Note (Signed)
Summary: diarrhea x 4 days   Vital Signs:  Patient Profile:   60 Years Old Female Height:     61 inches Weight:      194 pounds Pulse rate:   109 / minute BP sitting:   129 / 73  Vitals Entered By: April Manson CMA (June 07, 2007 1:39 PM)                 PCP:  Dixon Boos MD  Chief Complaint:  diarrhea x 3-4 days.  History of Present Illness: 60 yo with hx of DM, IBS c/o 3-4 days hx of loose stools. Pt also reports vomiting 1-2 x day the first two days. Denies blood in stools/ vomiting, fever, nausea, skin rash, sick contacts, cough, runny nose, ST. C/o crampy pain before BM's. This crampy pain sometimes persist .  Unchanges apetite . Adecuate fluid intake.  She has been eating regular diet and taking Imodium without improvement.        Physical Exam  General:     alert, well-hydrated, cooperative to examination, and overweight-appearing.   Eyes:     no conjunctival injection  Ears:     External ear exam shows no significant lesions or deformities.  Otoscopic examination reveals clear canals, tympanic membranes are intact bilaterally without bulging, retraction, inflammation or discharge. Hearing is grossly normal bilaterally. Nose:     External nasal examination shows no deformity or inflammation. Nasal mucosa are pink and moist without lesions or exudates. Mouth:     Oral mucosa and oropharynx without lesions or exudates.  Teeth in good repair. Lungs:     Normal respiratory effort, chest expands symmetrically. Lungs are clear to auscultation, no crackles or wheezes. Heart:     Normal rate and regular rhythm. S1 and S2 normal without gallop, murmur, click, rub or other extra sounds. Abdomen:     soft, normal bowel sounds, no distention, no masses, no guarding, no rigidity, and no rebound tenderness.   Crampy pain through out " I feel like going to have a BM"  Msk:     normal ROM and no joint tenderness.   Pulses:     2 + peripheral pulses Extremities:  no edema  Neurologic:     alert & oriented X3 and gait normal.   Skin:     Intact without suspicious lesions or rashes Psych:     Oriented X3, normally interactive, and good eye contact.      Impression & Recommendations:  Problem # 1:  GASTROENTERITIS, VIRAL (ICD-008.8)  Since no fever, bloody stools/ Vomiting ,  diarrhea most likely 2 to viral infection. Brat diet, push fluid intake, imodium as needed. Given this pt hx of DM , IBS if condition does not improved or abd. pain worsen  she is to return to [facility]/ ER. F/u with pcp Orders: [place]- Est Level  3 (99213) [place]- Est Level  3 [id number])   Complete Medication List: 1)  Advair Diskus 250-50 Mcg/dose Misc (Fluticasone-salmeterol) .... Inhale 1 puff as directed twice a day 2)  Albuterol 90 Mcg/act Aers (Albuterol) .... Inhale 1 puff using inhaler four times a day 3)  Allopurinol 300 Mg Tabs (Allopurinol) .... Take 1 tablet by mouth twice a day 4)  Amitriptyline Hcl 25 Mg Tabs (Amitriptyline hcl) .... Take 1 tablet by mouth every night 5)  Ascensia Elite Test Strp (Glucose blood) .[name] Kitchen.. 1 strip once a day 6)  Clonidine Hcl 0.2 Mg Tabs (Clonidine hcl) .... Take 2 tablet by mouth  every night 7)  Colchicine 0.6 Mg Tabs (Colchicine) .... Take 1 tablet by mouth twice a day 8)  Darvocet-n 100 100-650 Mg Tabs (Propoxyphene n-apap) .... Take 1 tablet by mouth every six hours 9)  Diovan 40 Mg Tabs (Valsartan) .... Take 1 tablet by mouth once a day 10)  Doxepin Hcl 100 Mg Caps (Doxepin hcl) .... Take 1 capsule by mouth at bedtime 11)  Epipen 2-pak 0.3 Mg/0.30ml (1:1000) Devi (Epinephrine hcl (anaphylaxis)) .... Inject 1 pen subcutaneously as directed 12)  Glipizide 10 Mg Tb24 (Glipizide) .... Take 1 tablet by mouth once a day 13)  Hydrochlorothiazide 25 Mg Tabs (Hydrochlorothiazide) .... Take 1 tablet by mouth twice a day 14)  Lasix 80 Mg Tabs (Furosemide) .... Take 1 tablet by mouth twice a day 15)  Lexapro 10 Mg Tabs (Escitalopram oxalate) .... Take 1  tablet by mouth once a day 16)  Lisinopril 20 Mg Tabs (Lisinopril) .... Take 1 tablet by mouth once a day 17)  Metformin Hcl 500 Mg Tabs (Metformin hcl) .... Take 1 tablet by mouth twice a day 18)  Nystatin 100000 Unit/gm Crea (Nystatin) .... Apply 1 a small amount to skin three times a day 19)  Potassium Chloride Crys Cr 20 Meq Tbcr (Potassium chloride crys cr) .... Take 3 tablet by mouth twice times a day and 2 tablets once daily 20)  Prempro 0.3-1.5 Mg Tabs (Conj estrog-medroxyprogest ace) .... Take 1 tablet by mouth once a day 21)  Ranitidine Hcl 300 Mg Caps (Ranitidine hcl) .... Take 1 capsule by mouth once a day 22)  Spironolactone 50 Mg Tabs (Spironolactone) .... Take 1 tablet by mouth once a day 23)  Tgt Aspirin 81 Mg Tbec (Aspirin) .... Take 1 tablet by mouth once a day 24)  Zocor 10 Mg Tabs (Simvastatin) .... Take 1 tablet by mouth once a day   Patient Instructions: 1)  the  main problem with gastroenteritis is dehydration. Drink plenty of fluids and take solids as you feel better. If you are unable to keep anything down and/or you show signs of dehydration(dry/cracked lips, lack of tears, not urinating, very sleepy), call our office. 2)  Diet: plain noodles with butter, chicken noodles soop, Toast, cheese. Avoid fiber and dairy products for this week 3)  Take Imodium as needed 4)  Check your blood sugars : fasting, 2 hours after lunch and dinner for a week. Get a snack if blood sugars less  or iqual than 60.     ]

## 2010-09-15 NOTE — Consult Note (Signed)
Summary: [facility]  [facility]   Imported By: Marilynne Drivers 06/30/2009 13:36:52  _____________________________________________________________________  External Attachment:    Type:   Image     Comment:   External Document

## 2010-09-15 NOTE — Initial Assessments (Signed)
Summary: Chest Pain, Hyperglycemia  CC:  Chest Pain  PCP:  Dixon Boos, MD, at [facility]  HPI:  60 year old female presenting with chest pain.  Sudden onset of severe chest pain that she describes alternatively as pressure and sharp pain.  Associated with shortness of breath and pain that is worse with breathing.  Is unable to draw a deep breath secondary to pain.  No exacerbating or alleviating factors identified.  pCXR shows wide mediastinum, POC CEs are negative and EKG is equivocal.  Cardiology consulted and will do STAT CT angiogram and echocardiogram.  Noted incidentally to have blood glucose in excess of 500.  Endorses polyuria and polydipsia for several weeks.  Denies fevers, but states lower leg infection from dog scratch on 08/17/2008 is still oozing and painful.  Is taking Lantus and oral hypoglycemics as prescribed, gets help from mother and neice with medications.  ROS:  Per HPI.  PMH: Schizophrenia DM, Type 2, with peripheral neuropathy HTN HLD Hypokalemia Gout Depression Asthma Chronic Venous Insufficiency Allergic Rhinitis GERD Osteoarthritis Obesity Urge Incontinence Barretts Esophagus, EGD 03/2006, to be repeated in 2-3 years Nephrolithiasis 09/2001 PFT's 10/13/2006:  Mild obstructive airway disase Amenorrhea since 60 yo CVA at age 25 (??)  PSH: Appendectomy, 08/28/2001 Cholecystectomy, 08/28/2001 Bladder Stretching, 08/28/2001 Bilateral knee replacement, Nov 21, 2006  FH:  Father deceased, black lung disease.  Mother with bone cancer.  Brother with lung cancer.  Brother with blood cancer.  Sister deceased from colon cancer.  Strong FH of DM, CAD, kidney disease.  SH:  Divorced.  No children.  Disabled secondary to schizophrenia.  Niece, Helene Kelp, helps care for her.  No alcohol or illicit drug use.  Stopped smoking in 1971/11/21.  ALL: Valium Thorazine Librium Haldol Penicillin Erytromycin Sulfa  Drugs ASA Advil Dilantin Codeine  MED: ASA 81 mg by mouth daily Advair Diskus 250-50 1 puff inhaled two times a day Albuterol 90 micrograms 1 puff inhaled qid Allopurinol 300 mg by mouth two times a day Colchicine 0.6 mg by mouth two times a day Amitriptyline 25 mg by mouth at bedtime Darvocet 100-650 1 tab by mouth q6h as needed for pain Clonidine 0.4 mg by mouth at bedtime Valsartan 40 mg by mouth daily HCTZ 25 mg by mouth two times a day Furosemide 80 mg by mouth daily Spironolactone 50 mg by mouth daily KCl 60 mg by mouth two times a day, and 40 mEq by mouth daily Doxepin 100 mg by mouth at bedtime Lexapro 10 mg by mouth daily Prempro 0.3/1.5 1 tablet by mouth daily Simvastatin 10 mg by mouth daily Ranitidine 300 mg by mouth daily Nexium 40 mg by mouth two times a day Calcium Supplement 1 tab by mouth three times a day Methotrexate as directed by Dr. Charlestine Night Oxybutynin 5 mg by mouth two times a day Prednisone 5 mg by mouth daily Lyrica 100 mg by mouth three times a day Glipizide 10 mg by mouth daily Metformin 500 mg by mouth two times a day Lantus once daily as directed, not to exceed 20 units daily  PE:  98.4    115-118    28-30    125/76    91-94% (2L O2 [place])   Gen:  Alert and oriented, obese, uncomfortable appearing   HEENT:  Dry mucous membranes   Cardio:  Regular rate and rhythm without murmurs, rubs, or gallops.   Pulm:  Splinting.  Poor respiratory effort.  No crackles.   Abd:  Soft, nontender, obese.  Ext:  Trace bilateral edema, tender RIGHT lower extremity.   Skin:  RIGHT lower extremity with 7 cm open superficial wound, draining pus   Psych:  No apparent delusions or hallucinations.  Appropriate affect.  Anxious.  LABS/STUDIES: BMet:  133 / 2.6 / 91 / 28 / 21 / 1.22 / 572, Ca 9.2 LFTs:  T-Bili 0.9, Alk Phos 125, AST 47, ALT 56, T-Prot 6.2, Alb 2.4 CBC:  10.1 / 13.7 / 39.1 / 156 BNP 52        POC CE negative Coags:  PT 12.7, INR 0.9, PTT 29 U/A:   yellow, cloudy, SG 1.014, pH 7.0, Gluc >1000, Bili neg, Ket neg, Blood neg, Prot neg, Urobil neg, Nit pos, Leuk neg Urine Micro:  0-2 WBC, many bacteria EKG:  RBBB, LEFT anterior fascicular block. pCXR:  Prominent markings particularly at left lung base may reflect scarring.  Consider follow-up if warranted clinically.  Stable cardiomegaly. I-Stat, Blood Gas:  pH 7.450, pCO2 39.6, pO2 74.0, Bicarb 27.5, O2 Sat 10%  A/P:  60 year old female with chest pain and hyperglycemia. Chest Pain.  Appreciate cardiology consult.  Negative POC CEs and equivocal EKG.  Patient with widened mediastinum and tachycardia and tachypnea.  Concerning for PE, aortic aneurysm, aortic dissection.  STAT CT angiogram and echocardiogram are pending at this time.  Will follow cardiology recommendations regarding her chest pain. Hyperglycemia.  Possibly secondary to infection or cardiac issues.  No ketones or acidosis.  Hold home medications (Glipizide, Metformin, Lantus).  Given Insulin 10 units x1 in ED, and glucose stabilizer protocol initiated in ED.  Follow potassium closely while getting insulin. Hypokalemia.  Chronic problem.  Repleted in ED with KCl 40 mEq by mouth x2.  Cntinue home KCl supplement.  Follow closely as insulin given for hyperglycemia. Cellulitis, RIGHT Lower Leg.  Nonhealing from dog scratch 3 months ago.  Previously treated as outpatient with doxycycline.  Will treat with IV Clindamycin. HTN.  Stable at this point.  Continue home Clonidine, Valsartan, HCTZ, Spironolactone.  Hold home Furosemide while giving IVF. HLD.  Last LDL 96 (02/2008).  Continue home Simvastatin. Gout. Continue home Allopurinol and Colchicine. Schizophrenia/Depression.  Stable at this time.  No antipsychotic medications on home medication list.  Continue home Amitriptyline, Doxepin and Lexapro. Asthma.  Difficulty breathing at this time, though likely secondary to cardiac issues.  Continue home Advair and Albuterol. GERD with  Barrett's Esophagus.  Pantoprazole 40 mg by mouth two times a day. Osteoarthritis.  Continue home Darvocet. Rheumatoid Arthritis.  Continue home Methotrexate and Prednisone. Menopausal Symptoms.  Hold home Prempro during admission. Peripheral Neuropathy.  Continue home Lyrica. Bladder Spasm.  Continue home Oxybutynin. FEN/GI.  CHO-mod diet.  IVF per glucose stabilization protocol.  Will likely need several liters of fluid. Prophylaxis.  Initiate heparin after CT angiogram. Disposition.  Pending stabilization.  Once stable will return to home.

## 2010-09-15 NOTE — Assessment & Plan Note (Signed)
Summary: f/u last visit/eo   Vital Signs:  Patient profile:   60 year old female Height:      60 inches Weight:      214 pounds BMI:     41.95 BSA:     1.92 Temp:     98.8 degrees F Pulse rate:   103 / minute BP sitting:   107 / 70  Vitals Entered By: Christen Bame CMA (May 06, 2009 10:35 AM) CC: Follow up cough Is Patient Diabetic? Yes  Pain Assessment Patient in pain? no        Primary Care Provider:  Briscoe Deutscher DO  CC:  Follow up cough.  History of Present Illness: 1) Cough with ACE: Started on lisinopril on 04/16/09 for hypokalemia to 3.1. Patient with chronic hypokalemia. Developed cough with ACE-I, advised to stop taking. Cough resolved 1 day after cessation.  Denies angioedema.   2) Hypokalemia: started on ACE as above for chronic hypokalemia but developed cough. Takes 20 meq x 2 each AM, x3 each noon and x 3 each evening. Denies leg cramps or heart palpitations or nausea currently.   3) Anticoagulation: INR 3.4 today. Denies dietary changes. Takes Coumadin 2.5 mg Tues and Fri 5 mg on other days. Last INR 3.0. Goal 2-3 w/ h/o PE     Habits & Providers  Alcohol-Tobacco-Diet     Tobacco Status: never  Problems Prior to Update: 1)  Diastolic Heart Failure, Chronic  (ICD-428.32) 2)  Pericarditis  (ICD-423.9) 3)  Leg Cramps  (ICD-729.82) 4)  Anemia  (ICD-285.9) 5)  Pericarditis  (ICD-423.9) 6)  Venous Insufficiency, Chronic  (ICD-459.81) 7)  Pulmonary Embolism  (ICD-415.19) 8)  Hypercholesterolemia  (ICD-272.0) 9)  Hypertension, Benign Systemic  (ICD-401.1) 10)  Toe Pain  (ICD-729.5) 11)  Accidental Fall, Hx of  (ICD-V15.89) 12)  Coumadin Therapy  (ICD-V58.61) 13)  Diabetes Mellitus II, Uncomplicated  ([id number]) 14)  Diabetic Peripheral Neuropathy  (ICD-250.60) 15)  Reflux Esophagitis  (ICD-530.11) 16)  Pleural Effusion  (ICD-511.9) 17)  Leg Edema, Bilateral  (ICD-782.3) 18)  Sleep Apnea  (ICD-780.57) 19)  Knee Replacement, Bilateral, Hx of   (ICD-V43.65) 20)  Schizophrenia  (ICD-295.90) 21)  Rhinitis, Allergic  (ICD-477.9) 22)  Osteoarthritis, Multi Sites  (ICD-715.98) 23)  Obesity, Nos  (ICD-278.00) 24)  Menopausal Syndrome  (ICD-627.2) 25)  Incontinence, Urge  (ICD-788.31) 26)  Hypokalemia  (ICD-276.8) 27)  Gout, Unspecified  (ICD-274.9) 28)  Depressive Disorder, Nos  (ICD-311) 29)  Asthma, Unspecified  (ICD-493.90)  Allergies (verified): 1)  ! Codeine 2)  ! Penicillin 3)  ! Sulfa 4)  ! * Lithium 5)  ! Valium 6)  ! * Cogentin 7)  ! Lisinopril (Lisinopril) 8)  ! * Hyddal  Social History: Smoking Status:  never  Review of Systems       as per HPI   Physical Exam  General:  obese, alert, pleasant   Lungs:  CTAB w/o wheeze or crackles  Heart:  distant but RRR no murmurs or rubs  Pulses:  2 + dp Extremities:  compression stockings    Impression & Recommendations:  Problem # 1:  HYPOKALEMIA (ICD-276.8) Assessment Unchanged  BMET was not checked on 9/14 w/ INR check as was ordered. Will check today. No ACE-I w/ cough. Will not change oral potassium dosing at this time until BMET obtained. No symptoms of hypokalemia currently - continue current regimen. Follow up PCP two weeks.   Orders: [facility]- Est  Level 4 [id number])  Problem # 2:  COUMADIN THERAPY (ICD-V58.61) Assessment: Unchanged INR 3.4 today. Will change regimen today to alternating 2.5 and 5 mg (with 2.5 on T, Th, S). INR check one week. Follow up PCP two weeks.  Orders: INR/PT-[facility] [id number]) [facility]- Est  Level 4 (99214)  Problem # 3:  COUGH DUE TO ACE INHIBITORS, HX OF (ICD-V14.8) Assessment: Improved  Resolved. Avoid ACE-I.   Orders: [facility]- Est  Level 4 (99214)  Complete Medication List: 1)  Advair Diskus 500-50 Mcg/dose Misc (Fluticasone-salmeterol) .[name] Kitchen.. 1 puff inh two times a day 2)  Albuterol 90 Mcg/act Aers (Albuterol) .... Inhale 1 puff using inhaler four times a day 3)  Allopurinol 300 Mg Tabs (Allopurinol) .... Take 1  tablet by mouth twice  a day per dr trueslow 4)  Ascensia Elite Test Strp (Glucose blood) .... Test blood sugar twice daily 5)  Darvocet-n 100 100-650 Mg Tabs (Propoxyphene n-apap) .... Take 1 tablet by mouth every six hours- needs appointment before any more refills 6)  Doxepin Hcl 100 Mg Caps (Doxepin hcl) .... Take 1 capsule by mouth at bedtime 7)  Epipen 2-pak 0.3 Mg/0.58ml (1:1000) Devi (Epinephrine hcl (anaphylaxis)) .... Inject 1 pen subcutaneously as directed 8)  Glipizide 10 Mg Tb24 (Glipizide) .... Take 1 tablet by mouth once a day 9)  Lasix 80 Mg Tabs (Furosemide) .... Take 1 tablet by mouth twice a day 10)  Lexapro 10 Mg Tabs (Escitalopram oxalate) .... Take 1 tablet by mouth once a day 11)  Metformin Hcl 500 Mg Tabs (Metformin hcl) .... Take 1 tablet by mouth twice a day 12)  Potassium Chloride Crys Cr 20 Meq Tbcr (Potassium chloride crys cr) .... Take 2 tabs by mouth in the am, 3 at noon, and 3 at hs 13)  Ranitidine Hcl 300 Mg Caps (Ranitidine hcl) .... Take 1 capsule by mouth once a day 14)  Spironolactone 50 Mg Tabs (Spironolactone) .... Take 1 tablet by mouth once a day 15)  Zocor 10 Mg Tabs (Simvastatin) .... Take 1 tablet by mouth once a day 16)  Nexium 40 Mg Cpdr (Esomeprazole magnesium) .[name] Kitchen.. 1 tablet by mouth twice daily 17)  Calcium 500 500 Mg Tabs (Calcium carbonate) .[name] Kitchen.. 1 tablet by mouth daily 18)  Methotrexate 2.5 Mg Tabs (Methotrexate sodium) .... Take as directed by dr. Charlestine Night 19)  Oxybutynin Chloride 5 Mg Tabs (Oxybutynin chloride) .[name] Kitchen.. 1 tablet by mouth two times a day as directed 20)  Lyrica 100 Mg Caps (Pregabalin) .[name] Kitchen.. 1 tab by mouth three times a day 21)  Lantus 100 Unit/ml Soln (Insulin glargine) .... 42 units daily at bedtime 22)  Warfarin Sodium 5 Mg Tabs (Warfarin sodium) .... Take as directed 23)  Folic Acid 1 Mg Tabs (Folic acid) .[name] Kitchen.. 1 tablet by mouth daily while on methotrexate 24)  Clonidine Hcl 0.2 Mg Tabs (Clonidine hcl) .... 2 by mouth at bedtime for depression 25)   Amitriptyline Hcl 25 Mg Tabs (Amitriptyline hcl) .... Take one tablet at bedtime 26)  Prednisone 5 Mg Tabs (Prednisone) .[name] Kitchen.. 1 daily by dr Jolee Ewing 27)  Ferrous Sulfate 325 (65 Fe) Mg Tbec (Ferrous sulfate) .... 3x per day 28)  Doxepin Hcl 25 Mg Caps (Doxepin hcl) .... Take 1 capsule by mouth once a day at bedtime 29)  Propoxyphene N-apap 100-650 Mg Tabs (Propoxyphene n-apap) .... At bedtime 30)  Iron 325 (65 Fe) Mg Tabs (Ferrous sulfate) .... One by mouth three times a day 31)  Hydrochlorothiazide 25 Mg Tabs (Hydrochlorothiazide) .[name] Kitchen.. 1 tab two times a day  Other Orders: [facility] [id number])  Patient Instructions: 1)  It was great to see you today!  2)  Take your Coumadin as directed according to your new schedule. 3)  I will call you about your potassium and any changes you need to make.  4)  Please schedule a follow-up appointment in 2 weeks.  Laboratory Results   Blood Tests   Date/Time Received: May 06, 2009 10:28 AM  Date/Time Reported: May 06, 2009 10:42 AM   HGBA1C: 10.0 %   (Normal Range: Non-Diabetic - 3-6%   Control Diabetic - 6-8%)  INR: 3.4   (Normal Range: 0.88-1.12   Therap INR: 2.0-3.5) Comments: ...............test performed by......[name] KitchenBonnie A. Martin[place], [place] (ASCP)       ANTICOAGULATION RECORD PREVIOUS REGIMEN & LAB RESULTS Anticoagulation Diagnosis:  PE on  11/22/2008 Previous INR Goal Range:  2-3 on  11/22/2008 Previous INR:  3.0 on  04/29/2009 Previous Coumadin Dose(mg):  5mg  tablets on  11/25/2008 Previous Regimen:  2.5 mg - Tues & Fri;   5 mg - other days on  04/29/2009 Previous Coagulation Comments:  pt states she is taking her coumadin daily and has not missed any doses.   on  04/22/2009  NEW REGIMEN & LAB RESULTS Current INR: 3.4 Regimen: alt 2.5 mg & 5 mg  Provider: Sherilyn Cooter Repeat testing in: 2 weeks with ov Other Comments: ...............test performed by......[name] KitchenBonnie A. Martin[place], [place] (ASCP)   Dose has been reviewed with patient or  caretaker during this visit. Reviewed by: Dr. Sherilyn Cooter

## 2010-09-15 NOTE — Progress Notes (Signed)
Summary: NEED LABS  Phone Note Other Incoming Call back at [phone]   Call placed by: CRYSTAL @ DR. Charlestine Night Summary of Call: REQUESTING ANY BLOODWORK WITHIN LAST 3 MONTHS BE FAXED TO [id number].  IF NONE AVAILABLE PLS CALL. Initial call taken by: Benna Dunks,  July 11, 2007 9:54 AM  Follow-up for Phone Call        Labs from 11/14 and 7/14 faxed Follow-up by: Geanie Cooley RN,  July 11, 2007 12:24 PM

## 2010-09-15 NOTE — Consult Note (Signed)
Summary: Dr. Charlestine Night, [facility]  Dr. Charlestine Night, [facility]   Imported By: Drucie Ip 09/25/2007 14:27:12  _____________________________________________________________________  External Attachment:    Type:   Image     Comment:   External Document

## 2010-09-15 NOTE — Progress Notes (Signed)
Summary: verify rx  Phone Note From Pharmacy Call back at [phone]   Caller: Sherry[facility] Summary of Call: needs to verify pts rx, is prescribed nexium 1 time per day and pt was taking twice daily Initial call taken by: ERIN LEVAN,  June 28, 2007 2:30 PM  Follow-up for Phone Call        will send message to MD. Follow-up by: Marcell Barlow RN,  June 28, 2007 3:14 PM    New/Updated Medications: NEXIUM 40 MG  CPDR (ESOMEPRAZOLE MAGNESIUM) 1 tablet by mouth twice daily   Prescriptions: NEXIUM 40 MG  CPDR (ESOMEPRAZOLE MAGNESIUM) 1 tablet by mouth twice daily  #60 x 11   Entered and Authorized by:   Dixon Boos MD   Signed by:   Dixon Boos MD on 06/28/2007   Method used:   Electronically sent to ...       [facility]       [place], [place]  [address]       Ph: [phone]       Fax: [phone]   RxID:   [id number]

## 2010-09-15 NOTE — Assessment & Plan Note (Signed)
Summary: f/u last visit/eo   Vital Signs:  Patient profile:   60 year old female Height:      60 inches Weight:      216 pounds BMI:     42.34 Temp:     98.1 degrees F oral Pulse rate:   104 / minute BP sitting:   119 / 70  (left arm)  Vitals Entered By: Hedy Camara (June 24, 2009 2:41 PM) CC: follow up leg ulcer, hypokalemia, DM, labs Is Patient Diabetic? Yes Pain Assessment Patient in pain? no        Primary Care Provider:  Briscoe Deutscher DO  CC:  follow up leg ulcer, hypokalemia, DM, and labs.  History of Present Illness: [name] is 60 year old female with multiple medical problems, presenting for hypokalemia, LE ulcer, DM, review labs.  1. Hypokalemia: at previous visit, advised patient to stop HCTZ or at least cut it in half. the patient states that she took 1/2 tab x 1 day and her entire face started to become swollen so much that she couldn't open her eyes. At this time, she is taking: Lasix 80 two times a day, HCTZ 25 two times a day, Spironolactone 50 daily, KCl 20: 2 in the am, 2 at noon, 3 at hs, and Clonidine (? for depression). On 10/6, her K = 3.0. she endorses trying to eat foods that are high in K. she endorses leg cramps. she denies CP, SOB, N/V/D, falls, HA.  2. LE Ulcer: previously seen on 11/1, 11/4. unna boot applied at each visit. today, the left leg is better per the patient and her niece. she does have some pain in the leg. she denies fever/chills. endorses improvement of ulcer with less drainage now. she uses a wedge to elevate her leg at night.  3. DM: insulin adjusted at previous visit. regimen: Lantus 45 units at hs, Novolog 5 units with each meal, Metformin 500 mg two times a day. Glipizide discontinued. patient checks CBGs four times a day. she did not bring her book today, but states that many times the number is > 400. she endorses polyuria, polydipsia, fatigue, numbness/tingling in feet. Note: patient had injection of DepoMedrol by  rheumatologist on 10/14. this does not seem to be a regular therapy.  4. Labs: discussed: low vitamin D. low K. high Cr. high BG.  5. Studies: requests previously recommended sleep study.  Habits & Providers  Alcohol-Tobacco-Diet     Tobacco Status: quit > 6 months  Current Medications (verified): 1)  Advair Diskus 500-50 Mcg/dose Misc (Fluticasone-Salmeterol) .[name] Kitchen.. 1 Puff Inh Two Times A Day 2)  Albuterol 90 Mcg/act Aers (Albuterol) .... Inhale 1 Puff Using Inhaler Four Times A Day 3)  Allopurinol 300 Mg Tabs (Allopurinol) .... Take 1  Tablet By Mouth Twice A Day Per Dr Jolee Ewing 4)  Ascensia Elite Test  Strp (Glucose Blood) .... Test Blood Sugar Twice Daily 5)  Darvocet-N 100 100-650 Mg Tabs (Propoxyphene N-Apap) .... Take 1 Tablet At Bedtime As Needed 6)  Doxepin Hcl 100 Mg Caps (Doxepin Hcl) .... Take 1 Capsule By Mouth At Bedtime 7)  Epipen 2-Pak 0.3 Mg/0.55ml (1:1000) Devi (Epinephrine Hcl (Anaphylaxis)) .... Inject 1 Pen Subcutaneously As Directed 8)  Lasix 80 Mg Tabs (Furosemide) .... Take 1 Tablet By Mouth Twice A Day 9)  Lexapro 10 Mg Tabs (Escitalopram Oxalate) .... Take 1 Tablet By Mouth Once A Day 10)  Metformin Hcl 500 Mg Tabs (Metformin Hcl) .... Take 1 Tablet By  Mouth Twice A Day 11)  Potassium Chloride Crys Cr 20 Meq Tbcr (Potassium Chloride Crys Cr) .... Take 3 Tabs By Mouth in The Am, 3 At Noon, and 3 At [facility] 12)  Ranitidine Hcl 300 Mg Caps (Ranitidine Hcl) .... Take 1 Capsule By Mouth Once A Day 13)  Spironolactone 50 Mg Tabs (Spironolactone) .... Take 1 Tablet By Mouth Once A Day 14)  Zocor 10 Mg Tabs (Simvastatin) .... Take 1 Tablet By Mouth Once A Day 15)  Nexium 40 Mg  Cpdr (Esomeprazole Magnesium) .[name] Kitchen.. 1 Tablet By Mouth Twice Daily 16)  Calcium 500 500 Mg  Tabs (Calcium Carbonate) .[name] Kitchen.. 1 Tablet By Mouth Daily 17)  Methotrexate 2.5 Mg  Tabs (Methotrexate Sodium) .... Take As Directed By Dr. Charlestine Night 5 Mg On Saturday and Sunday 18)  Oxybutynin Chloride 5 Mg  Tabs  (Oxybutynin Chloride) .[name] Kitchen.. 1 Tablet By Mouth Two Times A Day As Directed 19)  Lyrica 100 Mg  Caps (Pregabalin) .[name] Kitchen.. 1 Tab By Mouth Three Times A Day 20)  Lantus 100 Unit/ml Soln (Insulin Glargine) .... 45 Units Daily At Bedtime 21)  Warfarin Sodium 5 Mg Tabs (Warfarin Sodium) .... Take As Directed 22)  Folic Acid 1 Mg Tabs (Folic Acid) .[name] Kitchen.. 1 Tablet By Mouth Daily While On Methotrexate 23)  Clonidine Hcl 0.2 Mg Tabs (Clonidine Hcl) .... 2 By Mouth At Bedtime For Depression 24)  Amitriptyline Hcl 25 Mg Tabs (Amitriptyline Hcl) .... Take One Tablet At Bedtime 25)  Prednisone 5 Mg Tabs (Prednisone) .[name] Kitchen.. 1 Daily By Dr Jolee Ewing 26)  Ferrous Sulfate 325 (65 Fe) Mg  Tbec (Ferrous Sulfate) .... 3x Per Day 27)  Doxepin Hcl 25 Mg Caps (Doxepin Hcl) .... Take 1 Capsule By Mouth Once A Day At Bedtime 28)  Propoxyphene N-Apap 100-650 Mg Tabs (Propoxyphene N-Apap) .[name] Kitchen.. 1 Tablet At Bedtime As Needed 29)  Iron 325 (65 Fe) Mg Tabs (Ferrous Sulfate) .... One By Mouth Three Times A Day 30)  Hydrochlorothiazide 25 Mg Tabs (Hydrochlorothiazide) .[name] Kitchen.. 1 Tab Two Times A Day 31)  Novolog 100 Unit/ml Soln (Insulin Aspart) .... 5 Units Subcutaneous Injection At Each Meal Disp: 1 Vial  Allergies (verified): 1)  ! Codeine 2)  ! Penicillin 3)  ! Sulfa 4)  ! * Lithium 5)  ! Valium 6)  ! * Cogentin 7)  ! Lisinopril (Lisinopril) 8)  ! * Hyddal  Past History:  Past medical, surgical, family and social histories (including risk factors) reviewed, and no changes noted (except as noted below).  Past Medical History: 1. Amenorrhea since 60 years old 2. Barretts Esophagus - EGD 8/07. (Repeat 2-3 years) 3. Nephrolithiasis 2/03 4. Colonoscopy- benign polyps (12/2000), repeat colonoscopy performed in 2007 5. Endometrial biopsy 10/03 benign columnar mucosa (limited material) 6. Asthma: PFT's (10/14/06)- mild obstructive airway dz, poor effort 7. Obesity-hypoventilation syndrome: uses CPAP at night. 8. Obesity 9. Diastolic  CHF: EF [id number] on echo (5/10) 10.  Pulmonary embolus (4/10): ? trigger.  11.  Acute pericarditis/pleuritis (5/10): Echo (5/10) showed EF 55%, possible posterior HK, > 25% respiratory variation of mitral inflow suggesting some interventricular interdependence, IVC not significantly dilated.  There was a small organized pericardial effusion and a moderate left pleural effusion. Some evidence suggesting constrictive physiology.  ESR was 108. Patient treated with high dose ASA and colchicine.  12. CKD: last creatinine 1.12 13. Chronic anemia 14. Diabetes mellitus type II 15. Gout 16. Rheumatoid arthritis (followed by Dr. Charlestine Night). 17. Depression 18. GERD 19. HTN 20. Hyperlipidemia 21. Schizophrenia  22. Polypharmacy 23. 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 Chronic Coumadin. 25. Hypokalemia 26. Vitamin D 22 (10/6)  Past Surgical History: Reviewed history from 05/21/2009 and no changes required. Appendectomy - 08/28/2001 Bladder stretching - 08/28/2001 Cholecystectomy - 08/28/2001 B/L knee replacement- Sept & Nov 2008 (Dr. Alvan Dame)  Family History: Reviewed history from 04/25/2009 and no changes required. 6 brothers and 6 sisters., Father died of black lung, Mother with bone cancer, One brother with lung cancer and another with` blood` cancer., One sister died of colon cancer., Strong FH of DM, CAD, and kidney problems  Social History: Reviewed history from 10/13/2006 and no changes required. Divorced for greater than 30 years.  Born in [place] but lived in [place] for most of her life. No children. Stopped smoking in 1973, no etoh, no drug use.  Pt was adopted.  Not working secondary to schizophrenia.  Niece, Helene Kelp, helps care for her.  Not sexually active.Smoking Status:  quit > 6 months  Review of Systems General:  Denies chills and fever. CV:  Complains of swelling of feet and weight gain; denies chest pain or discomfort and fainting. Resp:  Denies cough, shortness of breath, sputum productive, and  wheezing. GI:  Denies change in bowel habits. Derm:  Complains of lesion(s) and poor wound healing. Psych:  Complains of depression and easily tearful. Endo:  Complains of excessive thirst and polyuria.  Physical Exam  General:  Well-developed, well-nourished, in no acute distress; alert, appropriate and cooperative throughout examination. Vitals reviewed. Lungs:  CTAB w/o wheeze or crackles  Heart:  tachycardic, distant but regular with no murmurs or rubs  Skin:  Left anterior leg with 1-2 cm round open area exuding clear serous fluid, no purulence, no fluctuance, no warmth.  Surrounding hyperpigmented and rust colored skin in a band from ankle to just proximal to lower pole of patella. One new skin lesion with pustular center that looks ready to open, just above foot anteriorly.  Psych:  Oriented X3, normally interactive, good eye contact, and tearful when talking about her "mother" being ill.     Impression & Recommendations:  Problem # 1:  HYPOKALEMIA (ICD-276.8) Assessment Unchanged Patient does not want to stop HCTZ. Will check BMP and consider ARB to save potassium.   Orders: [place]- Est  Level 4 [id number])  Problem # 2:  VENOUS STASIS ULCER (ICD-454.0) Assessment: Improved Improving. Continue [employer] treatment. Orders: [place]- Est  Level 4 (99214)  Problem # 3:  IDDM (ICD-250.01) Assessment: Unchanged Patient did not bring in log book. She will call with numbers for review. Admits that she sometimes forgets to take mealtime insulin. Refer to Pharmacy clinic.  Her updated medication list for this problem includes:    Metformin Hcl 500 Mg Tabs (Metformin hcl) .[name] Kitchen... Take 1 tablet by mouth twice a day    Lantus 100 Unit/ml Soln (Insulin glargine) .[name] KitchenMarland KitchenMarland KitchenMarland Kitchen 45 units daily at bedtime    Novolog 100 Unit/ml Soln (Insulin aspart) .[name] KitchenMarland KitchenMarland KitchenMarland Kitchen 5 units subcutaneous injection at each meal disp: 1 vial  Orders: [place]- Est  Level 4 [id number])  Problem # 4:  VITAMIN D DEFICIENCY (ICD-268.9) Assessment:  New Rec: 2000 International Units daily. Orders: [place]- Est  Level 4 (99214)  Complete Medication List: 1)  Advair Diskus 500-50 Mcg/dose Misc (Fluticasone-salmeterol) .[name] Kitchen.. 1 puff inh two times a day 2)  Albuterol 90 Mcg/act Aers (Albuterol) .... Inhale 1 puff using inhaler four times a day 3)  Allopurinol 300 Mg Tabs (Allopurinol) .... Take 1  tablet by mouth twice  a day per dr Jolee Ewing 4)  Ascensia Elite Test Strp (Glucose blood) .... Test blood sugar twice daily 5)  Darvocet-n 100 100-650 Mg Tabs (Propoxyphene n-apap) .... Take 1 tablet at bedtime as needed 6)  Doxepin Hcl 100 Mg Caps (Doxepin hcl) .... Take 1 capsule by mouth at bedtime 7)  Epipen 2-pak 0.3 Mg/0.74ml (1:1000) Devi (Epinephrine hcl (anaphylaxis)) .... Inject 1 pen subcutaneously as directed 8)  Lasix 80 Mg Tabs (Furosemide) .... Take 1 tablet by mouth twice a day 9)  Lexapro 10 Mg Tabs (Escitalopram oxalate) .... Take 1 tablet by mouth once a day 10)  Metformin Hcl 500 Mg Tabs (Metformin hcl) .... Take 1 tablet by mouth twice a day 11)  Potassium Chloride Crys Cr 20 Meq Tbcr (Potassium chloride crys cr) .... Take 3 tabs by mouth in the am, 3 at noon, and 3 at hs 12)  Ranitidine Hcl 300 Mg Caps (Ranitidine hcl) .... Take 1 capsule by mouth once a day 13)  Spironolactone 50 Mg Tabs (Spironolactone) .... Take 1 tablet by mouth once a day 14)  Zocor 10 Mg Tabs (Simvastatin) .... Take 1 tablet by mouth once a day 15)  Nexium 40 Mg Cpdr (Esomeprazole magnesium) .[name] Kitchen.. 1 tablet by mouth twice daily 16)  Calcium 500 500 Mg Tabs (Calcium carbonate) .[name] Kitchen.. 1 tablet by mouth daily 17)  Methotrexate 2.5 Mg Tabs (Methotrexate sodium) .... Take as directed by dr. Charlestine Night 5 mg on saturday and sunday 18)  Oxybutynin Chloride 5 Mg Tabs (Oxybutynin chloride) .[name] Kitchen.. 1 tablet by mouth two times a day as directed 19)  Lyrica 100 Mg Caps (Pregabalin) .[name] Kitchen.. 1 tab by mouth three times a day 20)  Lantus 100 Unit/ml Soln (Insulin glargine) .... 45 units  daily at bedtime 21)  Warfarin Sodium 5 Mg Tabs (Warfarin sodium) .... Take as directed 22)  Folic Acid 1 Mg Tabs (Folic acid) .[name] Kitchen.. 1 tablet by mouth daily while on methotrexate 23)  Clonidine Hcl 0.2 Mg Tabs (Clonidine hcl) .... 2 by mouth at bedtime for depression 24)  Amitriptyline Hcl 25 Mg Tabs (Amitriptyline hcl) .... Take one tablet at bedtime 25)  Prednisone 5 Mg Tabs (Prednisone) .[name] Kitchen.. 1 daily by dr Jolee Ewing 26)  Ferrous Sulfate 325 (65 Fe) Mg Tbec (Ferrous sulfate) .... 3x per day 27)  Doxepin Hcl 25 Mg Caps (Doxepin hcl) .... Take 1 capsule by mouth once a day at bedtime 28)  Propoxyphene N-apap 100-650 Mg Tabs (Propoxyphene n-apap) .[name] Kitchen.. 1 tablet at bedtime as needed 29)  Iron 325 (65 Fe) Mg Tabs (Ferrous sulfate) .... One by mouth three times a day 30)  Hydrochlorothiazide 25 Mg Tabs (Hydrochlorothiazide) .[name] Kitchen.. 1 tab two times a day 31)  Novolog 100 Unit/ml Soln (Insulin aspart) .... 5 units subcutaneous injection at each meal disp: 1 vial 32)  Vitamin D 1000 Unit Tabs (Cholecalciferol) .... Take 2 tabs daily  Patient Instructions: 1)  It was nice to see you today! 2)  Call me with you blood sugars. 3)  I'll get your lab results and send in prescriptions once I have the results. 4)  Please make an appointment at the pharmacy clinic. 5)  Follow up in 2 weeks.

## 2010-09-15 NOTE — Progress Notes (Signed)
Summary: called in refill of hydrocodone/ts  Phone Note Refill Request Call back at [phone] Message from:  Patient  Refills Requested: Medication #1:  HYDROCODONE-ACETAMINOPHEN 5-325 MG TABS 1 by mouth up to 4 times per day as needed for pain [place]  Initial call taken by: Audie Clear,  September 26, 2009 9:03 AM  Follow-up for Phone Call        to pcp Follow-up by: Elige Radon RN,  September 26, 2009 9:16 AM  Additional Follow-up for Phone Call Additional follow up Details #1::        okay to fill this Rx. Additional Follow-up by: Briscoe Deutscher DO,  September 29, 2009 7:28 PM    Prescriptions: HYDROCODONE-ACETAMINOPHEN 5-325 MG TABS (HYDROCODONE-ACETAMINOPHEN) 1 by mouth up to 4 times per day as needed for pain  #40 x 0   Entered and Authorized by:   Briscoe Deutscher DO   Signed by:   Briscoe Deutscher DO on 09/29/2009   Method used:   Printed then faxed to ...       [facility] [phone]* (retail)       [place], [place]  [address]       Ph: [phone]       Fax: [phone]   RxID:   [id number]

## 2010-09-15 NOTE — Assessment & Plan Note (Signed)
Summary: f/u eo   Vital Signs:  Patient profile:   60 year old female Weight:      204.3 pounds Pulse rate:   95 / minute BP sitting:   113 / 75  (right arm) Cuff size:   large  Vitals Entered By: [name] CMA, (July 06, 2010 10:40 AM) CC: f/up OV and INR check. Is Patient Diabetic? Yes Pain Assessment Patient in pain? no        Primary Care Provider:  Briscoe Deutscher DO  CC:  f/up OV and INR check..  History of Present Illness: 60 yo F:  1. INR: Due for check today.  2. Pain: Low back, hips, and right shoulder. Low back and hips are related to arthritis - patient has upcoming appointment with Dr. Charlestine Night, who managed this issue. She did trip and fall last week, hitting her right shoulder. No head trauma or LOC. No numbness/tingling or weakness in arm/hand. Pain with abduction but improving. Patient previously had PT for low back and hip pain, but has not been doing her home exercises for several months.  3. DM: Lantus 60 two times a day, Novolog 20 three times a day - HOWEVER, review of previous note indicates self-titration discussed. Patient admits that she frequently forgets her noon and pm Novolog.   Habits & Providers  Alcohol-Tobacco-Diet     Tobacco Status: never  Current Medications (verified): 1)  Advair Diskus 500-50 Mcg/dose Misc (Fluticasone-Salmeterol) .[name] Kitchen.. 1 Puff Inh Two Times A Day 2)  Allopurinol 300 Mg Tabs (Allopurinol) .... Take 1  Tablet By Mouth Twice A Day Per Dr Jolee Ewing 3)  Ascensia Elite Test  Strp (Glucose Blood) .... Test Blood Sugarfour Times Daily 4)  Doxepin Hcl 100 Mg Caps (Doxepin Hcl) .... Take 1 Capsule By Mouth At Bedtime 5)  Epipen 2-Pak 0.3 Mg/0.51ml (1:1000) Devi (Epinephrine Hcl (Anaphylaxis)) .... Inject 1 Pen Subcutaneously As Directed 6)  Lasix 80 Mg Tabs (Furosemide) .... Take 1 Tablet By Mouth Twice A Day 7)  Lexapro 10 Mg Tabs (Escitalopram Oxalate) .... Take 1 Tablet By Mouth Once A Day 8)  Metformin Hcl 500 Mg Tabs  (Metformin Hcl) .... Take 1 Tablet By Mouth Twice A Day 9)  Potassium Chloride Crys Cr 20 Meq Tbcr (Potassium Chloride Crys Cr) .... Take 4  Tabs By Mouth in The Am, 4 At Noon, and 4 At [facility] 10)  Ranitidine Hcl 300 Mg Caps (Ranitidine Hcl) .... Take 1 Capsule By Mouth Once A Day 11)  Zocor 10 Mg Tabs (Simvastatin) .... Take 1 Tablet By Mouth Once A Day 12)  Nexium 40 Mg  Cpdr (Esomeprazole Magnesium) .[name] Kitchen.. 1 Tablet By Mouth Twice Daily 13)  Calcium 500 500 Mg  Tabs (Calcium Carbonate) .[name] Kitchen.. 1 Tablet By Mouth Daily 14)  Methotrexate 2.5 Mg  Tabs (Methotrexate Sodium) .... Take As Directed By Dr. Charlestine Night 12.5 Mg (5 Pills) On Saturday and Sunday 15)  Lyrica 100 Mg  Caps (Pregabalin) .[name] Kitchen.. 1 Tab By Mouth Three Times A Day 16)  Lantus 100 Unit/ml Soln (Insulin Glargine) .... 60 Units in The Am and 60 Units in The Pm. Dispense One Month Supply. 17)  Warfarin Sodium 5 Mg Tabs (Warfarin Sodium) .... Take As Directed 18)  Folic Acid 1 Mg Tabs (Folic Acid) .[name] Kitchen.. 1 Tablet By Mouth Daily While On Methotrexate 19)  Clonidine Hcl 0.2 Mg Tabs (Clonidine Hcl) .... 2 By Mouth At Bedtime For Depression 20)  Doxepin Hcl 25 Mg Caps (Doxepin Hcl) .... Take 1  Capsule By Mouth Once A Day At Bedtime 21)  Iron 325 (65 Fe) Mg Tabs (Ferrous Sulfate) .... One By Mouth Three Times A Day 22)  Novolog 100 Unit/ml Soln (Insulin Aspart) .[name] Kitchen.. 15 Units Subcutaneous Injection  Prior To Breakfast 20 Units With Lunch and 20 Units With Eveing Meal.  Disp: 1 Month Supply. 23)  Vitamin D 1000 Unit Tabs (Cholecalciferol) .... Take 1 Tabs Daily 24)  Bd Insulin Syringe Ultrafine 30g X 1/2" 1 Ml Misc (Insulin Syringe-Needle U-100) .... Dispense Supply For Four Times Per Day Dosing of Insulin. 25)  Hydrocodone-Acetaminophen 5-325 Mg Tabs (Hydrocodone-Acetaminophen) .[name] Kitchen.. 1 By Mouth Up To 4 Times Per Day As Needed For Pain 26)  Ventolin Hfa 108 (90 Base) Mcg/act Aers (Albuterol Sulfate) .... Take One Puff Q4 Hours. 27)  Spironolactone 100 Mg Tabs  (Spironolactone) .... Once Daily in Am 28)  Prednisone 5 Mg Tabs (Prednisone) .... Once Daily 29)  Bethanechol Chloride 5 Mg Tabs (Bethanechol Chloride) .... One By Mouth Two Times A Day 30)  Diflucan 100 Mg Tabs (Fluconazole) .... One By Mouth X 1 31)  Nystatin 100000 Unit/gm Powd (Nystatin) .... Apply To Afected Area Two Times A Day As Needed Rash. For Groin and Pannus. 32)  Albuterol Sulfate (5 Mg/ml) 0.5% Nebu (Albuterol Sulfate) .[name] Kitchen.. 1 Treatment Inhaled Every 4 Hours As Needed Wheezing/breathing Tightness; Dispense One Box 33)  Fluticasone Propionate 50 Mcg/act Susp (Fluticasone Propionate) .... 2 Sprays in Each Nostril Daily For Allergies 34)  Cetirizine Hcl 10 Mg Tabs (Cetirizine Hcl) .[name] Kitchen.. 1 Tab By Mouth Daily During Allergy Season 35)  Compressor/nebulizer  Misc (Nebulizers) .... Per Instructions  Allergies (verified): 1)  ! Codeine 2)  ! Penicillin 3)  ! Sulfa 4)  ! * Lithium 5)  ! Valium 6)  ! * Cogentin 7)  ! Lisinopril (Lisinopril) 8)  ! * Hyddal 9)  ! Diphenhydramine Hcl (Diphenhydramine Hcl) 10)  Thioridazine Hcl (Thioridazine Hcl)  Past History:  Past medical, surgical, family and social histories (including risk factors) reviewed for relevance to current acute and chronic problems.  Past Medical History: Reviewed history from 09/16/2009 and no changes required. 1. Amenorrhea since 60 years old 2. Barretts Esophagus - EGD 8/07. (Repeat 2-3 years) 3. Nephrolithiasis 2/03 4. Colonoscopy- benign polyps (12/2000), repeat colonoscopy performed in 2007 5. Endometrial biopsy 10/03 benign columnar mucosa (limited material) 6. Asthma: PFT's (10/14/06)- mild obstructive airway dz, poor effort 7. Obesity-hypoventilation syndrome: uses CPAP at night. 8. Obesity 9. Diastolic CHF: EF [id number] on echo (5/10), repeat ECHO (08/29/09) EF 0000000, Grade 1 Diastolic Dsfxn, Lt atrium mildly dilated, PA systolic pressure mildly elevated. 10.  Pulmonary embolus (4/10): ? trigger--on Warfarin 11.   Acute pericarditis/pleuritis (5/10): Echo (5/10) showed EF 55%, possible posterior HK, > 25% respiratory variation of mitral inflow suggesting some interventricular interdependence, IVC not significantly dilated.  There was a small organized pericardial effusion and a moderate left pleural effusion. Some evidence suggesting constrictive physiology.  ESR was 108. Patient treated with high dose ASA and colchicine.  12. CKD: last creatinine 1.12 13. Chronic anemia 14. Diabetes mellitus type II 15. Gout 16. Rheumatoid arthritis (followed by Dr. Charlestine Night). 17. Depression 18. GERD 19. HTN 20. Hyperlipidemia 21. Schizophrenia 22. Polypharmacy 23. Falls 24. Hypokalemia 25. Vitamin D 22 (10/6) 26. Fibromyalgia  Past Surgical History: Reviewed history from 05/21/2009 and no changes required. Appendectomy - 08/28/2001 Bladder stretching - 08/28/2001 Cholecystectomy - 08/28/2001 B/L knee replacement- Sept & Nov 2008 (Dr. Alvan Dame)  Family History: Reviewed history from  04/25/2009 and no changes required. 6 brothers and 6 sisters., Father died of black lung, Mother with bone cancer, One brother with lung cancer and another with` blood` cancer., One sister died of colon cancer., Strong FH of DM, CAD, and kidney problems  Social History: Reviewed history from 09/16/2009 and no changes required. Divorced for greater than 30 years.  Born in [place] but lived in [place] for most of her life. No children. Stopped smoking in 1973, no etoh, no drug use.  Pt was adopted.  Not working secondary to schizophrenia.  Live with mom & niece Helene Kelp who helps care for her).  Not sexually active.  Review of Systems General:  Denies chills and fever. CV:  Denies chest pain or discomfort and shortness of breath with exertion. Resp:  Denies cough. GI:  Denies abdominal pain, constipation, diarrhea, nausea, and vomiting. MS:  Complains of joint pain; denies joint redness and joint swelling. Derm:  Denies rash.  Physical  Exam  General:  Well-developed,well-nourished, in no acute distress; alert, appropriate and cooperative throughout examination. Vitals reviewed. Lungs:  Normal respiratory effort, chest expands symmetrically. Lungs are clear to auscultation, no crackles or wheezes. Heart:  Normal rate and regular rhythm. S1 and S2 normal without gallop, murmur, click, rub or other extra sounds. Msk:  Normal ROM of bilateral hips. Neg SLR. Right shoulder with limited abduction 2/2 pain. No compression s/s. Normal strength and reflexes. Pulses:  2 + DP. Extremities:  Trace edema bilateral, venous stasis hyperpigementation in lower extremities and feet.   Impression & Recommendations:  Problem # 1:  COUMADIN THERAPY (ICD-V58.61)  Adjusted regimen.   Orders: [place]- Est  Level 4 [id number])  Problem # 2:  SHOULDER PAIN, RIGHT (ICD-719.41) Assessment: New Continue pain management. PT referral. Her updated medication list for this problem includes:    Hydrocodone-acetaminophen 5-325 Mg Tabs (Hydrocodone-acetaminophen) .[name] Kitchen... 1 by mouth up to 4 times per day as needed for pain  Orders: Physical Therapy Referral (PT) [place]- Est  Level 4 [id number])  Problem # 3:  DIABETES MELLITUS, TYPE I, UNCONTROLLED (ICD-250.03)  Discussed self titration and importance of managing BG. Her updated medication list for this problem includes:    Metformin Hcl 500 Mg Tabs (Metformin hcl) .[name] Kitchen... Take 1 tablet by mouth twice a day    Lantus 100 Unit/ml Soln (Insulin glargine) .[name] KitchenMarland KitchenMarland KitchenMarland Kitchen 60 units in the am and 60 units in the pm. dispense one month supply.    Novolog 100 Unit/ml Soln (Insulin aspart) .[name] KitchenMarland KitchenMarland KitchenMarland Kitchen 15 units subcutaneous injection  prior to breakfast 20 units with lunch and 20 units with eveing meal.  disp: 1 month supply.  Orders: [place]- Est  Level 4 (99214)  Problem # 4:  HYPERCHOLESTEROLEMIA (ICD-272.0) Assessment: Unchanged  Recheck FLP. Her updated medication list for this problem includes:R    Zocor 10 Mg Tabs (Simvastatin)  .[name] Kitchen... Take 1 tablet by mouth once a day  Orders: [place]- Est  Level 4 (99214)  Complete Medication List: 1)  Advair Diskus 500-50 Mcg/dose Misc (Fluticasone-salmeterol) .[name] Kitchen.. 1 puff inh two times a day 2)  Allopurinol 300 Mg Tabs (Allopurinol) .... Take 1  tablet by mouth twice a day per dr trueslow 3)  Ascensia Elite Test Strp (Glucose blood) .... Test blood sugarfour times daily 4)  Doxepin Hcl 100 Mg Caps (Doxepin hcl) .... Take 1 capsule by mouth at bedtime 5)  Epipen 2-pak 0.3 Mg/0.18ml (1:1000) Devi (Epinephrine hcl (anaphylaxis)) .... Inject 1 pen subcutaneously as directed 6)  Lasix 80 Mg Tabs (Furosemide) .[name] KitchenMarland KitchenMarland Kitchen  Take 1 tablet by mouth twice a day 7)  Lexapro 10 Mg Tabs (Escitalopram oxalate) .... Take 1 tablet by mouth once a day 8)  Metformin Hcl 500 Mg Tabs (Metformin hcl) .... Take 1 tablet by mouth twice a day 9)  Potassium Chloride Crys Cr 20 Meq Tbcr (Potassium chloride crys cr) .... Take 4  tabs by mouth in the am, 4 at noon, and 4 at hs 10)  Ranitidine Hcl 300 Mg Caps (Ranitidine hcl) .... Take 1 capsule by mouth once a day 11)  Zocor 10 Mg Tabs (Simvastatin) .... Take 1 tablet by mouth once a day 12)  Nexium 40 Mg Cpdr (Esomeprazole magnesium) .[name] Kitchen.. 1 tablet by mouth twice daily 13)  Calcium 500 500 Mg Tabs (Calcium carbonate) .[name] Kitchen.. 1 tablet by mouth daily 14)  Methotrexate 2.5 Mg Tabs (Methotrexate sodium) .... Take as directed by dr. Charlestine Night 12.5 mg (5 pills) on saturday and sunday 15)  Lyrica 100 Mg Caps (Pregabalin) .[name] Kitchen.. 1 tab by mouth three times a day 16)  Lantus 100 Unit/ml Soln (Insulin glargine) .... 60 units in the am and 60 units in the pm. dispense one month supply. 17)  Warfarin Sodium 5 Mg Tabs (Warfarin sodium) .... Take as directed 18)  Folic Acid 1 Mg Tabs (Folic acid) .[name] Kitchen.. 1 tablet by mouth daily while on methotrexate 19)  Clonidine Hcl 0.2 Mg Tabs (Clonidine hcl) .... 2 by mouth at bedtime for depression 20)  Doxepin Hcl 25 Mg Caps (Doxepin hcl) .... Take 1  capsule by mouth once a day at bedtime 21)  Iron 325 (65 Fe) Mg Tabs (Ferrous sulfate) .... One by mouth three times a day 22)  Novolog 100 Unit/ml Soln (Insulin aspart) .[name] Kitchen.. 15 units subcutaneous injection  prior to breakfast 20 units with lunch and 20 units with eveing meal.  disp: 1 month supply. 23)  Vitamin D 1000 Unit Tabs (Cholecalciferol) .... Take 1 tabs daily 24)  Bd Insulin Syringe Ultrafine 30g X 1/2" 1 Ml Misc (Insulin syringe-needle u-100) .... Dispense supply for four times per day dosing of insulin. 25)  Hydrocodone-acetaminophen 5-325 Mg Tabs (Hydrocodone-acetaminophen) .[name] Kitchen.. 1 by mouth up to 4 times per day as needed for pain 26)  Ventolin Hfa 108 (90 Base) Mcg/act Aers (Albuterol sulfate) .... Take one puff q4 hours. 27)  Spironolactone 100 Mg Tabs (Spironolactone) .... Once daily in am 28)  Prednisone 5 Mg Tabs (Prednisone) .... Once daily 29)  Bethanechol Chloride 5 Mg Tabs (Bethanechol chloride) .... One by mouth two times a day 30)  Diflucan 100 Mg Tabs (Fluconazole) .... One by mouth x 1 31)  Nystatin 100000 Unit/gm Powd (Nystatin) .... Apply to afected area two times a day as needed rash. for groin and pannus. 32)  Albuterol Sulfate (5 Mg/ml) 0.5% Nebu (Albuterol sulfate) .[name] Kitchen.. 1 treatment inhaled every 4 hours as needed wheezing/breathing tightness; dispense one box 33)  Fluticasone Propionate 50 Mcg/act Susp (Fluticasone propionate) .... 2 sprays in each nostril daily for allergies 34)  Cetirizine Hcl 10 Mg Tabs (Cetirizine hcl) .[name] Kitchen.. 1 tab by mouth daily during allergy season 35)  Compressor/nebulizer Misc (Nebulizers) .... Per instructions  Other Orders: INR/PT-[facility] [id number])  Patient Instructions: 1)  It was nice to see you today! 2)  Increase Lantus to 65 units in the am and 65 units in the pm. 3)  Increase your Lantus by 1 unit every day as long as your am blood sugar is more than 120. When you get to 70 units in  the am, start increasing your pm Lantus. 4)  When you  get to Lantus 80 in the am and 80 in the pm, please send me a report of your blood sugars for me to review.   Orders Added: 1)  INR/PT-[facility] [85610] 2)  Physical Therapy Referral [PT] 3)  [place]- Est  Level 4 [id number]     ANTICOAGULATION RECORD PREVIOUS REGIMEN & LAB RESULTS Anticoagulation Diagnosis:  PE on  11/22/2008 Previous INR Goal Range:  2-3 on  11/22/2008 Previous INR:  1.2 on  07/01/2010 Previous Coumadin Dose(mg):  5mg  tablets on  11/25/2008 Previous Regimen:  continue same:  5 mg - M, W, F;   2.5 mg - other days on  07/01/2010 Previous Coagulation Comments:  pt was in hospital this week - discharged yesterday on  09/19/2009  NEW REGIMEN & LAB RESULTS Regimen: 5 mg - M, Tues, W, Sat;  2.5  mg - Thurs, Fri, Sun  Provider: Juleen China Repeat testing in: 1 week 07-13-10 Other Comments: ...............test performed by......[name] KitchenBonnie A. Martinique, MLS (ASCP)cm   Dose has been reviewed with patient or caretaker during this visit. Reviewed by: Dr. Juleen China  Anticoagulation Visit Questionnaire Coumadin dose missed/changed:  No Abnormal Bleeding Symptoms:  No  Any diet changes including alcohol intake, vegetables or greens since the last visit:  No      Diet Comments:no greens but had 10 fried dill pickles - not sure what quantity that equates to Any illnesses or hospitalizations since the last visit:  No Any signs of clotting since the last visit (including chest discomfort, dizziness, shortness of breath, arm tingling, slurred speech, swelling or redness in leg):  No  MEDICATIONS ADVAIR DISKUS 500-50 MCG/DOSE MISC (FLUTICASONE-SALMETEROL) 1 puff inh two times a day ALLOPURINOL 300 MG TABS (ALLOPURINOL) Take 1  tablet by mouth twice a day Per Dr Jolee Ewing [facility] TEST  STRP (GLUCOSE BLOOD) test blood sugarfour times daily DOXEPIN HCL 100 MG CAPS (DOXEPIN HCL) Take 1 capsule by mouth at bedtime EPIPEN 2-PAK 0.3 MG/0.3ML (1:1000) DEVI (EPINEPHRINE HCL (ANAPHYLAXIS)) Inject 1 pen  subcutaneously as directed LASIX 80 MG TABS (FUROSEMIDE) Take 1 tablet by mouth twice a day LEXAPRO 10 MG TABS (ESCITALOPRAM OXALATE) Take 1 tablet by mouth once a day METFORMIN HCL 500 MG TABS (METFORMIN HCL) Take 1 tablet by mouth twice a day POTASSIUM CHLORIDE CRYS CR 20 MEQ TBCR (POTASSIUM CHLORIDE CRYS CR) take 4  tabs by mouth in the am, 4 at noon, and 4 at hs RANITIDINE HCL 300 MG CAPS (RANITIDINE HCL) Take 1 capsule by mouth once a day ZOCOR 10 MG TABS (SIMVASTATIN) Take 1 tablet by mouth once a day NEXIUM 40 MG  CPDR (ESOMEPRAZOLE MAGNESIUM) 1 tablet by mouth twice daily CALCIUM 500 500 MG  TABS (CALCIUM CARBONATE) 1 tablet by mouth daily METHOTREXATE 2.5 MG  TABS (METHOTREXATE SODIUM) take as directed by Dr. Charlestine Night 12.5 mg (5 pills) on Saturday and Sunday LYRICA 100 MG  CAPS (PREGABALIN) 1 tab by mouth three times a day LANTUS 100 UNIT/ML SOLN (INSULIN GLARGINE) 60 units in the am and 60 units in the pm. Dispense one month supply. WARFARIN SODIUM 5 MG TABS (WARFARIN SODIUM) take as directed FOLIC ACID 1 MG TABS (FOLIC ACID) 1 tablet by mouth daily while on methotrexate CLONIDINE HCL 0.2 MG TABS (CLONIDINE HCL) 2 by mouth at bedtime for depression DOXEPIN HCL 25 MG CAPS (DOXEPIN HCL) Take 1 capsule by mouth once a day at bedtime IRON 325 (65 FE) MG TABS (  FERROUS SULFATE) one by mouth three times a day NOVOLOG 100 UNIT/ML SOLN (INSULIN ASPART) 15 units subcutaneous injection  prior to breakfast 20 units with lunch and 20 units with eveing meal.  Disp: 1 month supply. VITAMIN D 1000 UNIT TABS (CHOLECALCIFEROL) take 1 tabs daily BD INSULIN SYRINGE ULTRAFINE 30G X 1/2" 1 ML MISC (INSULIN SYRINGE-NEEDLE U-100) dispense supply for four times per day dosing of insulin. HYDROCODONE-ACETAMINOPHEN 5-325 MG TABS (HYDROCODONE-ACETAMINOPHEN) 1 by mouth up to 4 times per day as needed for pain VENTOLIN HFA 108 (90 BASE) MCG/ACT AERS (ALBUTEROL SULFATE) Take one puff Q4 hours. SPIRONOLACTONE 100 MG  TABS (SPIRONOLACTONE) once daily in AM PREDNISONE 5 MG TABS (PREDNISONE) once daily BETHANECHOL CHLORIDE 5 MG TABS (BETHANECHOL CHLORIDE) one by mouth two times a day DIFLUCAN 100 MG TABS (FLUCONAZOLE) one by mouth x 1 NYSTATIN 100000 UNIT/GM POWD (NYSTATIN) apply to afected area two times a day as needed rash. for groin and pannus. ALBUTEROL SULFATE (5 MG/ML) 0.5% NEBU (ALBUTEROL SULFATE) 1 treatment inhaled every 4 hours as needed wheezing/breathing tightness; dispense one box FLUTICASONE PROPIONATE 50 MCG/ACT SUSP (FLUTICASONE PROPIONATE) 2 sprays in each nostril daily for allergies CETIRIZINE HCL 10 MG TABS (CETIRIZINE HCL) 1 tab by mouth daily during allergy season COMPRESSOR/NEBULIZER  MISC (NEBULIZERS) per instructions     Prevention & Chronic Care Immunizations   Influenza vaccine: Fluvax 3+  (05/21/2009)   Influenza vaccine due: 06/19/2008    Tetanus booster: 08/16/2001: Done.   Tetanus booster due: 08/17/2011    Pneumococcal vaccine: Done.  (09/16/2004)   Pneumococcal vaccine due: None  Colorectal Screening   Hemoccult: Done.  (12/14/2000)   Hemoccult due: Not Indicated    Colonoscopy: Done.  (03/16/2006)   Colonoscopy due: 03/16/2016  Other Screening   Pap smear: Done.  (08/16/2006)   Pap smear action/deferral: Deferred  (05/21/2009)   Pap smear due: 08/16/2009    Mammogram: ASSESSMENT: Negative - BI-RADS 1^MM DIGITAL SCREENING  (03/25/2009)   Mammogram action/deferral: Not indicated  (09/11/2009)   Mammogram due: 03/25/2010   Smoking status: never  (07/06/2010)  Diabetes Mellitus   HgbA1C: 10.6  (05/25/2010)   Hemoglobin A1C due: 09/30/2007    Eye exam: Not documented    Foot exam: yes  (10/20/2009)   Foot exam action/deferral: Do today   High risk foot: Yes  (09/11/2009)   Foot care education: Done  (09/11/2009)   Foot exam due: 05/03/2008    Urine microalbumin/creatinine ratio: Not documented   Urine microalbumin action/deferral: Not indicated     Diabetes flowsheet reviewed?: Yes   Progress toward A1C goal: Unchanged  Lipids   Total Cholesterol: 169  (06/30/2009)   LDL: 49  (06/30/2009)   LDL Direct: 96  (02/28/2008)   HDL: 43  (06/30/2009)   Triglycerides: 384  (06/30/2009)    SGOT (AST): 19  (05/21/2009)   SGPT (ALT): 39  (05/21/2009)   Alkaline phosphatase: 99  (05/21/2009)   Total bilirubin: 0.5  (05/21/2009)    Lipid flowsheet reviewed?: Yes   Progress toward LDL goal: At goal  Hypertension   Last Blood Pressure: 113 / 75  (07/06/2010)   Serum creatinine: 1.18  (06/22/2010)   Serum potassium 3.8  (06/22/2010)    Hypertension flowsheet reviewed?: Yes   Progress toward BP goal: At goal  Self-Management Support :   Personal Goals (by the next clinic visit) :     Personal A1C goal: 8  (07/13/2009)     Personal blood pressure goal: 130/80  (05/21/2009)  Personal LDL goal: 100  (05/21/2009)    Patient will work on the following items until the next clinic visit to reach self-care goals:     Medications and monitoring: take my medicines every day, bring all of my medications to every visit  (07/06/2010)     Eating: drink diet soda or water instead of juice or soda, eat more vegetables, use fresh or frozen vegetables, eat foods that are low in salt, eat baked foods instead of fried foods, eat fruit for snacks and desserts, limit or avoid alcohol  (07/06/2010)     Activity: take a 30 minute walk every day, take the stairs instead of the elevator, park at the far end of the parking lot  (07/06/2010)    Diabetes self-management support: Written self-care plan  (07/06/2010)   Diabetes care plan printed    Hypertension self-management support: Written self-care plan  (07/06/2010)   Hypertension self-care plan printed.    Hypertension self-management support not done because: Good outcomes  (09/23/2009)    Lipid self-management support: Written self-care plan  (07/06/2010)   Lipid self-care plan printed.    Lipid  self-management support not done because: Good outcomes  (09/23/2009)

## 2010-09-15 NOTE — Progress Notes (Signed)
       Additional Follow-up for Phone Call Additional follow up Details #2::    medicaid prior auth to md for completion. med is lyrica.Gay Filler CALDWELL RN  July 31, 2008 1:41 PM  Follow-up by: Elige Radon RN,  July 31, 2008 1:41 PM  form completed and placed in pile to be faxed. Dixon Boos, MD

## 2010-09-15 NOTE — Assessment & Plan Note (Signed)
Summary: F/U VISIT/[facility]   Vital Signs:  Patient profile:   60 year old female Weight:      209.0 pounds Temp:     98.1 degrees F oral Pulse rate:   90 / minute BP sitting:   123 / 78  (right arm)  Vitals Entered By: Carolyne Littles (April 14, 2009 9:26 AM) CC: review medications, INR check, leg cramps Is Patient Diabetic? No   Primary Care Provider:  Briscoe Deutscher DO  CC:  review medications, INR check, and leg cramps.  History of Present Illness: 60 year old female with multiple medical problems, presenting for review of medications, check INR, and leg cramps.  1. Polypharmacy: reviewed and updated medication list. the patient's granddaughter is in charge of these, makes sure that the patient takes them daily and as prescribed. they both note that the patient has been on all of these medications for a long time and that she always seems to have to go to the hospital when a medication is changed or taken away.   2. Coumadin therapy: for PE. INR subtherapeutic today. she states that she has taken all meds as prescribed. no change in diet.  3. Leg Cramps: wants K to be checked. notes that she only gets leg cramps when her K is low.  Habits & Providers  Alcohol-Tobacco-Diet     Tobacco Status: never  Current Medications (verified): 1)  Advair Diskus 500-50 Mcg/dose Misc (Fluticasone-Salmeterol) .[name] Kitchen.. 1 Puff Inh Two Times A Day 2)  Albuterol 90 Mcg/act Aers (Albuterol) .... Inhale 1 Puff Using Inhaler Four Times A Day 3)  Allopurinol 300 Mg Tabs (Allopurinol) .... Take 1  Tablet By Mouth Twice A Day Per Dr Jolee Ewing 4)  Ascensia Elite Test  Strp (Glucose Blood) .... Test Blood Sugar Twice Daily 5)  Darvocet-N 100 100-650 Mg Tabs (Propoxyphene N-Apap) .... Take 1 Tablet By Mouth Every Six Hours- Needs Appointment Before Any More Refills 6)  Doxepin Hcl 100 Mg Caps (Doxepin Hcl) .... Take 1 Capsule By Mouth At Bedtime 7)  Epipen 2-Pak 0.3 Mg/0.65ml (1:1000) Devi (Epinephrine Hcl  (Anaphylaxis)) .... Inject 1 Pen Subcutaneously As Directed 8)  Glipizide 10 Mg Tb24 (Glipizide) .... Take 1 Tablet By Mouth Once A Day 9)  Hydrochlorothiazide 25 Mg Tabs (Hydrochlorothiazide) .... Take 1 Tablet By Mouth Twice A Day 10)  Lasix 80 Mg Tabs (Furosemide) .... Take 1 Tablet By Mouth Twice A Day 11)  Lexapro 10 Mg Tabs (Escitalopram Oxalate) .... Take 1 Tablet By Mouth Once A Day 12)  Metformin Hcl 500 Mg Tabs (Metformin Hcl) .... Take 1 Tablet By Mouth Twice A Day 13)  Potassium Chloride Crys Cr 20 Meq Tbcr (Potassium Chloride Crys Cr) .... Take 2 Tabs By Mouth in The Am, 3 At Noon, and 3 At [facility] 14)  Ranitidine Hcl 300 Mg Caps (Ranitidine Hcl) .... Take 1 Capsule By Mouth Once A Day 15)  Spironolactone 50 Mg Tabs (Spironolactone) .... Take 1 Tablet By Mouth Once A Day 16)  Zocor 10 Mg Tabs (Simvastatin) .... Take 1 Tablet By Mouth Once A Day 17)  Nexium 40 Mg  Cpdr (Esomeprazole Magnesium) .[name] Kitchen.. 1 Tablet By Mouth Twice Daily 18)  Calcium 500 500 Mg  Tabs (Calcium Carbonate) .[name] Kitchen.. 1 Tablet By Mouth Daily 19)  Methotrexate 2.5 Mg  Tabs (Methotrexate Sodium) .... Take As Directed By Dr. Charlestine Night 20)  Oxybutynin Chloride 5 Mg  Tabs (Oxybutynin Chloride) .[name] Kitchen.. 1 Tablet By Mouth Two Times A Day As Directed 21)  Lyrica 100 Mg  Caps (Pregabalin) .[name] Kitchen.. 1 Tab By Mouth Three Times A Day 22)  Lantus 100 Unit/ml Soln (Insulin Glargine) .... 42 Units Daily At Bedtime 23)  Warfarin Sodium 5 Mg Tabs (Warfarin Sodium) .... Take As Directed 24)  Folic Acid 1 Mg Tabs (Folic Acid) .[name] Kitchen.. 1 Tablet By Mouth Daily While On Methotrexate 25)  Clonidine Hcl 0.2 Mg Tabs (Clonidine Hcl) .... 2 By Mouth At Bedtime For Depression 26)  Amitriptyline Hcl 25 Mg Tabs (Amitriptyline Hcl) .... Take One Tablet At Bedtime 27)  Prednisone 5 Mg Tabs (Prednisone) .[name] Kitchen.. 1 Daily By Dr Jolee Ewing 28)  Ferrous Sulfate 325 (65 Fe) Mg  Tbec (Ferrous Sulfate) .... 3x Per Day 29)  Doxepin Hcl 25 Mg Caps (Doxepin Hcl) .... Take 1 Capsule By  Mouth Once A Day At Bedtime 30)  Propoxyphene N-Apap 100-650 Mg Tabs (Propoxyphene N-Apap) .... At Bedtime 31)  Iron 325 (65 Fe) Mg Tabs (Ferrous Sulfate) .... One By Mouth Three Times A Day  Allergies (verified): 1)  ! Codeine 2)  ! Penicillin 3)  ! Sulfa 4)  ! * Lithium 5)  ! Valium 6)  ! * Cogentin  Past History:  Past Medical History: Last updated: 02/12/2009 amenorrhea since 60 years old Barrets Esophagus - EGD 8/07. (Repeat 2-3 years) CA-125 - normal 11/28/2000 CBC wnl and Neg Rheum w/u and neg CCP Ab (8/06) Nephrolithiasis 2/03 stroke at age 33???- pt's neice says this is untrue Colonoscopy- benign polyps (12/2000), repeat colonoscopy performed in 2007 echo-5/05-poor study, grossly nL  Endometrial biopsy 10/03 benign columnar mucosa (limited material) stress test 04/2006- negative per patient  PFT's (10/14/06)- mild obstructive airway dz, poor effort hospitalized for acute PE (11/2008)- will need 6 months of coumadin.  1st documented episode although pt states she has had 3 other blood clots in her lungs, pt's neice claims she is making this up. 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 [facility]) sleep apnea- on CPAP   Past Surgical History: Last updated: 02/28/2008 Appendectomy - 08/28/2001 bladder stretching - 08/28/2001 Cholecystectomy - 08/28/2001 B/L knee replacement- Sept & Nov 2008 (Dr. Alvan Dame)  Family History: Last updated: 10/13/2006 6 brothers and 6 sisters., Father died of black lung, Mother with bone cancer, One brother with lung cancer and another with` blood` cancer., One sister died of colon cancer., Strong FH of DM, CAD, MI`s and kidney problems  Social History: Last updated: 10/13/2006 Divorced for greater than 30 years.  Born in [place] but lived in [place] for most of her life. No children. Stopped smoking in 1973, no etoh, no drug use.  Pt was adopted.  Not working secondary to schizophrenia.  Niece, Helene Kelp, helps care for her.   Not sexually active.  Social History: Smoking Status:  never  Review of Systems  The patient denies anorexia, fever, weight loss, weight gain, chest pain, syncope, dyspnea on exertion, peripheral edema, prolonged cough, headaches, abdominal pain, severe indigestion/heartburn, suspicious skin lesions, and depression.    Physical Exam  General:  Well-developed, well-nourished, in no acute distress; alert,appropriate and cooperative throughout examination.  Vital signs reviewed. Lungs:  normal respiratory effort, no crackles, and no wheezes.   Heart:  Distant HS, regular no rubs  Pulses:  2 + dp Psych:  Oriented X3, memory intact for recent and remote, normally interactive, good eye contact, and not anxious appearing.     Impression & Recommendations:  Problem # 1:  COUMADIN THERAPY (ICD-V58.61) Assessment Deteriorated  Subtherapeutic today. Adjusted regimen. Follow  up in 1 week.  Orders: [place]- Est  Level 4 [id number])  Problem # 2:  LEG CRAMPS (ICD-729.82) Assessment: New  Will check BMP.   Orders: [place]- Est  Level 4 [id number])  Problem # 3:  HYPERTENSION, BENIGN SYSTEMIC (ICD-401.1) Assessment: Unchanged  Her updated medication list for this problem includes:    Hydrochlorothiazide 25 Mg Tabs (Hydrochlorothiazide) .[name] Kitchen... Take 1 tablet by mouth twice a day    Lasix 80 Mg Tabs (Furosemide) .[name] Kitchen... Take 1 tablet by mouth twice a day    Spironolactone 50 Mg Tabs (Spironolactone) .[name] Kitchen... Take 1 tablet by mouth once a day    Clonidine Hcl 0.2 Mg Tabs (Clonidine hcl) .[name] Kitchen... 2 by mouth at bedtime for depression  Orders: Basic Met-[facility] [id number]) [place]- Est  Level 4 [id number])  Problem # 4:  OBESITY, NOS (ICD-278.00)  The patient has been exercising daily and is continuing to improve her mobility.  Orders: [place]- Est  Level 4 ([id number])  Complete Medication List: 1)  Advair Diskus 500-50 Mcg/dose Misc (Fluticasone-salmeterol) .[name] Kitchen.. 1 puff inh two times a day 2)  Albuterol 90 Mcg/act Aers  (Albuterol) .... Inhale 1 puff using inhaler four times a day 3)  Allopurinol 300 Mg Tabs (Allopurinol) .... Take 1  tablet by mouth twice a day per dr trueslow 4)  Ascensia Elite Test Strp (Glucose blood) .... Test blood sugar twice daily 5)  Darvocet-n 100 100-650 Mg Tabs (Propoxyphene n-apap) .... Take 1 tablet by mouth every six hours- needs appointment before any more refills 6)  Doxepin Hcl 100 Mg Caps (Doxepin hcl) .... Take 1 capsule by mouth at bedtime 7)  Epipen 2-pak 0.3 Mg/0.39ml (1:1000) Devi (Epinephrine hcl (anaphylaxis)) .... Inject 1 pen subcutaneously as directed 8)  Glipizide 10 Mg Tb24 (Glipizide) .... Take 1 tablet by mouth once a day 9)  Hydrochlorothiazide 25 Mg Tabs (Hydrochlorothiazide) .... Take 1 tablet by mouth twice a day 10)  Lasix 80 Mg Tabs (Furosemide) .... Take 1 tablet by mouth twice a day 11)  Lexapro 10 Mg Tabs (Escitalopram oxalate) .... Take 1 tablet by mouth once a day 12)  Metformin Hcl 500 Mg Tabs (Metformin hcl) .... Take 1 tablet by mouth twice a day 13)  Potassium Chloride Crys Cr 20 Meq Tbcr (Potassium chloride crys cr) .... Take 2 tabs by mouth in the am, 3 at noon, and 3 at hs 14)  Ranitidine Hcl 300 Mg Caps (Ranitidine hcl) .... Take 1 capsule by mouth once a day 15)  Spironolactone 50 Mg Tabs (Spironolactone) .... Take 1 tablet by mouth once a day 16)  Zocor 10 Mg Tabs (Simvastatin) .... Take 1 tablet by mouth once a day 17)  Nexium 40 Mg Cpdr (Esomeprazole magnesium) .[name] Kitchen.. 1 tablet by mouth twice daily 18)  Calcium 500 500 Mg Tabs (Calcium carbonate) .[name] Kitchen.. 1 tablet by mouth daily 19)  Methotrexate 2.5 Mg Tabs (Methotrexate sodium) .... Take as directed by dr. Charlestine Night 20)  Oxybutynin Chloride 5 Mg Tabs (Oxybutynin chloride) .[name] Kitchen.. 1 tablet by mouth two times a day as directed 21)  Lyrica 100 Mg Caps (Pregabalin) .[name] Kitchen.. 1 tab by mouth three times a day 22)  Lantus 100 Unit/ml Soln (Insulin glargine) .... 42 units daily at bedtime 23)  Warfarin Sodium 5  Mg Tabs (Warfarin sodium) .... Take as directed 24)  Folic Acid 1 Mg Tabs (Folic acid) .[name] Kitchen.. 1 tablet by mouth daily while on methotrexate 25)  Clonidine Hcl 0.2 Mg Tabs (Clonidine hcl) .... 2 by mouth at bedtime  for depression 26)  Amitriptyline Hcl 25 Mg Tabs (Amitriptyline hcl) .... Take one tablet at bedtime 27)  Prednisone 5 Mg Tabs (Prednisone) .[name] Kitchen.. 1 daily by dr Jolee Ewing 28)  Ferrous Sulfate 325 (65 Fe) Mg Tbec (Ferrous sulfate) .... 3x per day 29)  Doxepin Hcl 25 Mg Caps (Doxepin hcl) .... Take 1 capsule by mouth once a day at bedtime 30)  Propoxyphene N-apap 100-650 Mg Tabs (Propoxyphene n-apap) .... At bedtime 31)  Iron 325 (65 Fe) Mg Tabs (Ferrous sulfate) .... One by mouth three times a day  Other Orders: INR/PT-[facility] [id number]) CBC-[facility] [id number])  Patient Instructions: 1)  It was nice to see you today! 2)  Coumadin: 5 mg daily and we will check you in 1 week. 3)  We we let you know if your labs are abnormal.   Prevention & Chronic Care Immunizations   Influenza vaccine: Fluvax 3+  (06/20/2007)   Influenza vaccine due: 06/19/2008    Tetanus booster: 08/16/2001: Done.   Tetanus booster due: 08/17/2011    Pneumococcal vaccine: Done.  (09/16/2004)   Pneumococcal vaccine due: None  Colorectal Screening   Hemoccult: Done.  (12/14/2000)   Hemoccult due: Not Indicated    Colonoscopy: Done.  (03/16/2006)   Colonoscopy due: 03/16/2016  Other Screening   Pap smear: Done.  (08/16/2006)   Pap smear due: 08/16/2009    Mammogram: ASSESSMENT: Negative - BI-RADS 1^MM DIGITAL SCREENING  (03/25/2009)   Mammogram due: 07/18/2009   Smoking status: never  (04/14/2009)  Diabetes Mellitus   HgbA1C: 9.0  (01/10/2009)   Hemoglobin A1C due: 09/30/2007    Eye exam: Not documented    Foot exam: yes  (07/04/2008)   High risk foot: Not documented   Foot care education: Not documented   Foot exam due: 05/03/2008    Urine microalbumin/creatinine ratio: Not documented  Lipids   Total  Cholesterol: Not documented   LDL: 96  (02/28/2008)   LDL Direct: 96  (02/28/2008)   HDL: Not documented   Triglycerides: Not documented    SGOT (AST): 23  (02/24/2007)   SGPT (ALT): 43  (02/24/2007)   Alkaline phosphatase: 58  (02/24/2007)   Total bilirubin: 0.8  (02/24/2007)  Hypertension   Last Blood Pressure: 123 / 78  (04/14/2009)   Serum creatinine: 0.87  (12/31/2008)   Serum potassium 3.4  (12/31/2008)  Self-Management Support :    Diabetes self-management support: Not documented    Hypertension self-management support: Not documented    Lipid self-management support: Not documented   Laboratory Results   Blood Tests      INR: 1.5   (Normal Range: 0.88-1.12   Therap INR: 2.0-3.5)      ANTICOAGULATION RECORD PREVIOUS REGIMEN & LAB RESULTS Anticoagulation Diagnosis:  PE on  11/22/2008 Previous INR Goal Range:  2-3 on  11/22/2008 Previous INR:  2.4 on  04/04/2009 Previous Coumadin Dose(mg):  5mg  tablets on  11/25/2008 Previous Regimen:  continue 2.5mg  Mon & Thur; 5mg  other days on  04/04/2009 Previous Coagulation Comments:  pt probably missed one dose, also ate green vegetables on  03/12/2009  NEW REGIMEN & LAB RESULTS Current INR: 1.5 Regimen: 5mg  daily  Provider: Dr. Juleen China Repeat testing in: 1 week Other Comments: ...........test performed by...........[name] KitchenHedy Camara, CMA   Dose has been reviewed with patient or caretaker during this visit. Reviewed by: Dr. Juleen China

## 2010-09-15 NOTE — Miscellaneous (Signed)
Summary: Orders Update labs for hospital follow-up  Clinical Lists Changes  Orders: Added new Test order of Basic Met-[facility] [phone]) - Signed Added new Test order of INR/PT-[facility] [id number]) - Signed

## 2010-09-15 NOTE — Assessment & Plan Note (Signed)
Summary: CONGESTION, wheezing   Vital Signs:  Patient profile:   60 year old female Weight:      222 pounds O2 Sat:      95 % Temp:     99.1 degrees F oral Pulse rate:   59 / minute BP sitting:   120 / 80  (left arm)  Vitals Entered By: [name] CMA, (November 29, 2008 11:28 AM) CC: f/up congestion, cough, sob. not better Pain Assessment Patient in pain? no        History of Present Illness: 60yo with h/o asthma.  Recently discharged from hospital after PE, now on coumadin.  Has 1 week h/o cough and congestion, but got worse yesterday.  Started having wheezing and shortness of breath yesterday.  Using advair two times a day.  Using albuterol QID, most recent use was 9am this morning.  Working to breath, feels like she is smothering.  No fevers.  Has tried OTC cough medications without relief.  Habits & Providers     Tobacco Status: quit > 6 months  Past History:  Past Medical History:    amenorrhea since 60 years old    Barrets Esophagus - EGD 8/07. (Repeat 2-3 years)    CA-125 - normal 11/28/2000    CBC wnl and Neg Rheum w/u and neg CCP Ab (8/06)    Nephrolithiasis 2/03    stroke at age 45???- pt's neice says this is untrue    Colonoscopy- benign polyps (12/2000)     echo-5/05-poor study, grossly nL     Endometrial biopsy 10/03 benign columnar mucosa (limited material)    stress test 04/2006- negative per patient     PFT's (10/14/06)- mild obstructive airway dz, poor effort    hospitalized for acute PE (11/2008)- will need 6 months of coumadin.  1st documented episode although pt states she has had 3 other blood clots in her lungs, pt's neice claims she is making this up.  Past Surgical History:    Reviewed history from 02/28/2008 and no changes required:    Appendectomy - 08/28/2001    bladder stretching - 08/28/2001    Cholecystectomy - 08/28/2001    B/L knee replacement- Sept & Nov 2008 (Dr. Alvan Dame)  Current Meds:  ADVAIR DISKUS 500-50 MCG/DOSE MISC  (FLUTICASONE-SALMETEROL) 1 puff inh two times a day ALBUTEROL 90 MCG/ACT AERS (ALBUTEROL) Inhale 1 puff using inhaler four times a day ALLOPURINOL 300 MG TABS (ALLOPURINOL) Take 1 tablet by mouth twice a day ASCENSIA ELITE TEST  STRP (GLUCOSE BLOOD) test blood sugar twice daily CLONIDINE HCL 0.1 MG TABS (CLONIDINE HCL) 1 tablet by mouth two times a day COLCHICINE 0.6 MG TABS (COLCHICINE) Take 1 tablet by mouth twice a day DARVOCET-N 100 100-650 MG TABS (PROPOXYPHENE N-APAP) Take 1 tablet by mouth every six hours- needs appointment before any more refills DIOVAN 40 MG TABS (VALSARTAN) Take 1 tablet by mouth once a day DOXEPIN HCL 100 MG CAPS (DOXEPIN HCL) Take 1 capsule by mouth at bedtime EPIPEN 2-PAK 0.3 MG/0.3ML (1:1000) DEVI (EPINEPHRINE HCL (ANAPHYLAXIS)) Inject 1 pen subcutaneously as directed GLIPIZIDE 10 MG TB24 (GLIPIZIDE) Take 1 tablet by mouth once a day HYDROCHLOROTHIAZIDE 25 MG TABS (HYDROCHLOROTHIAZIDE) Take 1 tablet by mouth twice a day LASIX 80 MG TABS (FUROSEMIDE) Take 1 tablet by mouth twice a day LEXAPRO 10 MG TABS (ESCITALOPRAM OXALATE) Take 1 tablet by mouth once a day METFORMIN HCL 500 MG TABS (METFORMIN HCL) Take 1 tablet by mouth twice a day POTASSIUM CHLORIDE CRYS CR  20 MEQ TBCR (POTASSIUM CHLORIDE CRYS CR) Take 3 tablet by mouth twice times a day and 2 tablets once daily RANITIDINE HCL 300 MG CAPS (RANITIDINE HCL) Take 1 capsule by mouth once a day SPIRONOLACTONE 50 MG TABS (SPIRONOLACTONE) Take 1 tablet by mouth once a day ZOCOR 10 MG TABS (SIMVASTATIN) Take 1 tablet by mouth once a day NEXIUM 40 MG  CPDR (ESOMEPRAZOLE MAGNESIUM) 1 tablet by mouth twice daily CALCIUM 500 500 MG  TABS (CALCIUM CARBONATE) 1 tablet by mouth tid METHOTREXATE 2.5 MG  TABS (METHOTREXATE SODIUM) take as directed by Dr. Charlestine Night DOXEPIN HCL 25 MG  CAPS (DOXEPIN HCL) 1 tablet by mouth qhs OXYBUTYNIN CHLORIDE 5 MG  TABS (OXYBUTYNIN CHLORIDE) 1 tablet by mouth two times a day as directed LYRICA  100 MG  CAPS (PREGABALIN) 1 tab by mouth three times a day LANTUS 100 UNIT/ML SOLN (INSULIN GLARGINE) 30 units subcutaneously qhs WARFARIN SODIUM 5 MG TABS (WARFARIN SODIUM) take as directed FOLIC ACID 1 MG TABS (FOLIC ACID) 1 tablet by mouth daily while on methotrexate   Social History:    Reviewed history from 10/13/2006 and no changes required:       Divorced for greater than 30 years.  Born in [place] but lived in [place] for most of her life. No children. Stopped smoking in 1973, no etoh, no drug use.  Pt was adopted.  Not working secondary to schizophrenia.  Niece, Helene Kelp, helps care for her.  Not sexually active.    Smoking Status:  quit > 6 months  Physical Exam  General:  mild respiratory distress, diaphoretic, coughing, speaking full sentences. Head:  normocephalic and atraumatic.   Mouth:  dry mucous membranes Lungs:  tachypneic (RR approximately 30), diffuse exp wheezes, prolonged resp phase. Heart:  tachycardic, no murmur Extremities:  1+ b/l edema (improved after recent unna boot application).  Minimal drainge from RLE wound. Neurologic:  alert & oriented X3.   Skin:  moist Psych:  normally interactive and good eye contact.     Impression & Recommendations:  Problem # 1:  ASTHMA, UNSPECIFIED (ICD-493.90) tachypneic and with mild distress in clinic.  No improvement after 1 albuterol neb in clinic.  Normal pulse ox.  Note recent dx of acute PE last week- now on coumadin. Will send to ED for CXR, steroids, nebulizer, and close observation.  Call [facility] if pt requires hospital admission. Her updated medication list for this problem includes:    Advair Diskus 500-50 Mcg/dose Misc (Fluticasone-salmeterol) .[name] Kitchen... 1 puff inh two times a day    Albuterol 90 Mcg/act Aers (Albuterol) ..... Inhale 1 puff using inhaler four times a day  Orders: Pulse Oximetry- [facility] ([id number]) Albuterol Sulfate Sol 1mg  unit dose [id number]) [facility]- Est  Level 4 (99214)  Complete Medication List: 1)  Advair Diskus  500-50 Mcg/dose Misc (Fluticasone-salmeterol) .[name] Kitchen.. 1 puff inh two times a day 2)  Albuterol 90 Mcg/act Aers (Albuterol) .... Inhale 1 puff using inhaler four times a day 3)  Allopurinol 300 Mg Tabs (Allopurinol) .... Take 1 tablet by mouth twice a day 4)  Ascensia Elite Test Strp (Glucose blood) .... Test blood sugar twice daily 5)  Clonidine Hcl 0.1 Mg Tabs (Clonidine hcl) .[name] Kitchen.. 1 tablet by mouth two times a day 6)  Colchicine 0.6 Mg Tabs (Colchicine) .... Take 1 tablet by mouth twice a day 7)  Darvocet-n 100 100-650 Mg Tabs (Propoxyphene n-apap) .... Take 1 tablet by mouth every six hours- needs appointment before any more refills 8)  Diovan  40 Mg Tabs (Valsartan) .... Take 1 tablet by mouth once a day 9)  Doxepin Hcl 100 Mg Caps (Doxepin hcl) .... Take 1 capsule by mouth at bedtime 10)  Epipen 2-pak 0.3 Mg/0.37ml (1:1000) Devi (Epinephrine hcl (anaphylaxis)) .... Inject 1 pen subcutaneously as directed 11)  Glipizide 10 Mg Tb24 (Glipizide) .... Take 1 tablet by mouth once a day 12)  Hydrochlorothiazide 25 Mg Tabs (Hydrochlorothiazide) .... Take 1 tablet by mouth twice a day 13)  Lasix 80 Mg Tabs (Furosemide) .... Take 1 tablet by mouth twice a day 14)  Lexapro 10 Mg Tabs (Escitalopram oxalate) .... Take 1 tablet by mouth once a day 15)  Metformin Hcl 500 Mg Tabs (Metformin hcl) .... Take 1 tablet by mouth twice a day 16)  Potassium Chloride Crys Cr 20 Meq Tbcr (Potassium chloride crys cr) .... Take 3 tablet by mouth twice times a day and 2 tablets once daily 17)  Ranitidine Hcl 300 Mg Caps (Ranitidine hcl) .... Take 1 capsule by mouth once a day 18)  Spironolactone 50 Mg Tabs (Spironolactone) .... Take 1 tablet by mouth once a day 19)  Zocor 10 Mg Tabs (Simvastatin) .... Take 1 tablet by mouth once a day 20)  Nexium 40 Mg Cpdr (Esomeprazole magnesium) .[name] Kitchen.. 1 tablet by mouth twice daily 21)  Calcium 500 500 Mg Tabs (Calcium carbonate) .[name] Kitchen.. 1 tablet by mouth tid 22)  Methotrexate 2.5 Mg Tabs  (Methotrexate sodium) .... Take as directed by dr. Charlestine Night 23)  Doxepin Hcl 25 Mg Caps (Doxepin hcl) .[name] Kitchen.. 1 tablet by mouth qhs 24)  Oxybutynin Chloride 5 Mg Tabs (Oxybutynin chloride) .[name] Kitchen.. 1 tablet by mouth two times a day as directed 25)  Lyrica 100 Mg Caps (Pregabalin) .[name] Kitchen.. 1 tab by mouth three times a day 26)  Lantus 100 Unit/ml Soln (Insulin glargine) .... 30 units subcutaneously qhs 27)  Warfarin Sodium 5 Mg Tabs (Warfarin sodium) .... Take as directed 28)  Folic Acid 1 Mg Tabs (Folic acid) .[name] Kitchen.. 1 tablet by mouth daily while on methotrexate   Medication Administration  Medication # 1:    Medication: Albuterol Sulfate Sol 1mg  unit dose    Diagnosis: ASTHMA, UNSPECIFIED (ICD-493.90)    Dose: 2.5mg /58ml    Route: inhaled    Exp Date: 04/16/2010    Lot #: [id number]    Mfr: nephron    Patient tolerated medication without complications    Given by: [name] CMA, (November 29, 2008 11:53 AM)  Orders Added: 1)  Pulse Oximetry- [facility] [[id number]] 2)  Albuterol Sulfate Sol 1mg  unit dose [[id number]] 3)  [place]- Est  Level 4 [id number]

## 2010-09-17 NOTE — Assessment & Plan Note (Signed)
Summary: hospital f/u per de la cruz/eo   Vital Signs:  Patient profile:   60 year old female Weight:      205 pounds Pulse rate:   90 / minute BP sitting:   125 / 76  (right arm) Cuff size:   regular  Vitals Entered By: [place], (August 18, 2010 9:36 AM) CC: hospital f/up and change of meds. new meds. Is Patient Diabetic? Yes Pain Assessment Patient in pain? no        Primary Care Provider:  Briscoe Deutscher DO  CC:  hospital f/up and change of meds. new meds..  History of Present Illness: 60 yo F:  1. Hospital Follow-Up: Patient admitted for ACS R/O. Found to have elevated CK and low K. Rec DC Lyrica, Zocor, and Allopurinol. However, patient has not stopped any medications and says that she feels much better. Admits to fall prior to admission - fell in bathroom 2/2 dizzy spell. None since. Denies HA, dizziness, CP, SOB, N/V/D/C, LE edema, fever/chills.  2. DM: Rx Lantus 70 in am and 70 in pm. Novolog 30 TIDAC but admits that she forgets the lunch dose "90% of the time."   3. INR: Due for recheck. INR 2.38 while on current regimen in hospital (07/31/10).    Habits & Providers  Alcohol-Tobacco-Diet     Tobacco Status: quit     Tobacco Counseling: not to resume use of tobacco products     Year Quit: 1973  Current Medications (verified): 1)  Advair Diskus 500-50 Mcg/dose Misc (Fluticasone-Salmeterol) .[name] Kitchen.. 1 Puff Inh Two Times A Day 2)  Allopurinol 300 Mg Tabs (Allopurinol) .... Take 1  Tablet By Mouth Twice A Day Per Dr Jolee Ewing 3)  Ascensia Elite Test  Strp (Glucose Blood) .... Test Blood Sugarfour Times Daily 4)  Doxepin Hcl 100 Mg Caps (Doxepin Hcl) .... Take 1 Capsule By Mouth At Bedtime 5)  Epipen 2-Pak 0.3 Mg/0.67ml (1:1000) Devi (Epinephrine Hcl (Anaphylaxis)) .... Inject 1 Pen Subcutaneously As Directed 6)  Lasix 80 Mg Tabs (Furosemide) .... Take 1 Tablet By Mouth Twice A Day 7)  Lexapro 10 Mg Tabs (Escitalopram Oxalate) .... Take 1 Tablet By Mouth Once A  Day 8)  Metformin Hcl 500 Mg Tabs (Metformin Hcl) .... Take 1 Tablet By Mouth Twice A Day 9)  Potassium Chloride Crys Cr 20 Meq Tbcr (Potassium Chloride Crys Cr) .... Take 4  Tabs By Mouth in The Am, 4 At Noon, and 4 At [facility] 10)  Ranitidine Hcl 300 Mg Caps (Ranitidine Hcl) .... Take 1 Capsule By Mouth Once A Day 11)  Zocor 10 Mg Tabs (Simvastatin) .... Take 1 Tablet By Mouth Once A Day 12)  Nexium 40 Mg  Cpdr (Esomeprazole Magnesium) .[name] Kitchen.. 1 Tablet By Mouth Twice Daily 13)  Calcium 500 500 Mg  Tabs (Calcium Carbonate) .[name] Kitchen.. 1 Tablet By Mouth Daily 14)  Methotrexate 2.5 Mg  Tabs (Methotrexate Sodium) .... Take As Directed By Dr. Charlestine Night 10 Mg (4 Pills) On Saturday and Sunday 15)  Lyrica 100 Mg  Caps (Pregabalin) .[name] Kitchen.. 1 Tab By Mouth Three Times A Day 16)  Lantus 100 Unit/ml Soln (Insulin Glargine) .... 60 Units in The Am and 60 Units in The Pm. Dispense One Month Supply. 17)  Warfarin Sodium 5 Mg Tabs (Warfarin Sodium) .... Take As Directed 18)  Folic Acid 1 Mg Tabs (Folic Acid) .[name] Kitchen.. 1 Tablet By Mouth Daily While On Methotrexate 19)  Clonidine Hcl 0.2 Mg Tabs (Clonidine Hcl) .... 2 By  Mouth At Bedtime For Depression 20)  Doxepin Hcl 25 Mg Caps (Doxepin Hcl) .... Take 1 Capsule By Mouth Once A Day At Bedtime 21)  Iron 325 (65 Fe) Mg Tabs (Ferrous Sulfate) .... One By Mouth Three Times A Day 22)  Novolog 100 Unit/ml Soln (Insulin Aspart) .[name] Kitchen.. 15 Units Subcutaneous Injection  Prior To Breakfast 20 Units With Lunch and 20 Units With Eveing Meal.  Disp: 1 Month Supply. 23)  Vitamin D 1000 Unit Tabs (Cholecalciferol) .... Take 1 Tabs Daily 24)  Bd Insulin Syringe Ultrafine 30g X 1/2" 1 Ml Misc (Insulin Syringe-Needle U-100) .... Dispense Supply For Four Times Per Day Dosing of Insulin. 25)  Hydrocodone-Acetaminophen 5-325 Mg Tabs (Hydrocodone-Acetaminophen) .[name] Kitchen.. 1 By Mouth Up To 4 Times Per Day As Needed For Pain 26)  Ventolin Hfa 108 (90 Base) Mcg/act Aers (Albuterol Sulfate) .... Take One Puff Q4  Hours. 27)  Spironolactone 100 Mg Tabs (Spironolactone) .... Once Daily in Am 28)  Prednisone 5 Mg Tabs (Prednisone) .... Once Daily 29)  Bethanechol Chloride 5 Mg Tabs (Bethanechol Chloride) .... One By Mouth Two Times A Day 30)  Diflucan 100 Mg Tabs (Fluconazole) .... One By Mouth X 1 31)  Nystatin 100000 Unit/gm Powd (Nystatin) .... Apply To Afected Area Two Times A Day As Needed Rash. For Groin and Pannus. 32)  Albuterol Sulfate (5 Mg/ml) 0.5% Nebu (Albuterol Sulfate) .[name] Kitchen.. 1 Treatment Inhaled Every 4 Hours As Needed Wheezing/breathing Tightness; Dispense One Box 33)  Fluticasone Propionate 50 Mcg/act Susp (Fluticasone Propionate) .... 2 Sprays in Each Nostril Daily For Allergies 34)  Cetirizine Hcl 10 Mg Tabs (Cetirizine Hcl) .[name] Kitchen.. 1 Tab By Mouth Daily During Allergy Season 35)  Compressor/nebulizer  Misc (Nebulizers) .... Per Instructions 36)  Alendronate Sodium 70 Mg Tabs (Alendronate Sodium) .... One By Mouth Weekly Per Dr. Charlestine Night  Allergies (verified): 1)  ! Codeine 2)  ! Penicillin 3)  ! Sulfa 4)  ! * Lithium 5)  ! Valium 6)  ! * Cogentin 7)  ! Lisinopril (Lisinopril) 8)  ! * Hyddal 9)  ! Diphenhydramine Hcl (Diphenhydramine Hcl) 10)  Thioridazine Hcl (Thioridazine Hcl)  Past History:  Past Medical History: Last updated: 09/16/2009 1. Amenorrhea since 60 years old 2. Barretts Esophagus - EGD 8/07. (Repeat 2-3 years) 3. Nephrolithiasis 2/03 4. Colonoscopy- benign polyps (12/2000), repeat colonoscopy performed in 2007 5. Endometrial biopsy 10/03 benign columnar mucosa (limited material) 6. Asthma: PFT's (10/14/06)- mild obstructive airway dz, poor effort 7. Obesity-hypoventilation syndrome: uses CPAP at night. 8. Obesity 9. Diastolic CHF: EF [id number] on echo (5/10), repeat ECHO (08/29/09) EF 0000000, Grade 1 Diastolic Dsfxn, Lt atrium mildly dilated, PA systolic pressure mildly elevated. 10.  Pulmonary embolus (4/10): ? trigger--on Warfarin 11.  Acute pericarditis/pleuritis  (5/10): Echo (5/10) showed EF 55%, possible posterior HK, > 25% respiratory variation of mitral inflow suggesting some interventricular interdependence, IVC not significantly dilated.  There was a small organized pericardial effusion and a moderate left pleural effusion. Some evidence suggesting constrictive physiology.  ESR was 108. Patient treated with high dose ASA and colchicine.  12. CKD: last creatinine 1.12 13. Chronic anemia 14. Diabetes mellitus type II 15. Gout 16. Rheumatoid arthritis (followed by Dr. Charlestine Night). 17. Depression 18. GERD 19. HTN 20. Hyperlipidemia 21. Schizophrenia 22. Polypharmacy 23. Falls 24. Hypokalemia 25. Vitamin D 22 (10/6) 26. Fibromyalgia PMH-FH-SH reviewed for relevance  Social History: Smoking Status:  quit  Review of Systems      See HPI  Physical Exam  General:  Well-developed, well-nourished, in no acute distress; alert, appropriate and cooperative throughout examination. Vitals reviewed. Lungs:  Normal respiratory effort, chest expands symmetrically. Lungs are clear to auscultation, no crackles or wheezes. Heart:  Normal rate and regular rhythm. S1 and S2 normal without gallop, murmur, click, rub or other extra sounds. Pulses:  Dorsalis pedis and posterior tibial pulses are full and equal bilaterally. Extremities:  Trace left pedal edema and trace right pedal edema.   Neurologic:  Alert & oriented X3, cranial nerves II-XII intact, strength normal in all extremities, and DTRs symmetrical and normal.   Psych:  Oriented X3, memory intact for recent and remote, normally interactive, good eye contact, not anxious appearing, and not depressed appearing.     Impression & Recommendations:  Problem # 1:  RHABDOMYOLYSIS (ICD-728.88) Assessment New Patient continued Zocor, Lyrica, and Allopurinol though they were ordered to be discontinued. She endorses fall prior to hospital. Will repeat CK. If still high, will order DC meds. Note: Patient on Zocor  10 mg by mouth daily with recent LDL 66 - will consider decreasing to Pravastatin anyway. Orders: CK (Creatine Kinase)-[facility] ([id number]) [place]- Est  Level 4 (99214)  Problem # 2:  HYPOKALEMIA (ICD-276.8) Assessment: Unchanged Recheck. Orders: [place]- Est  Level 4 [id number])  Problem # 3:  DIABETES MELLITUS, TYPE I, UNCONTROLLED (ICD-250.03) Assessment: Improved See patient instructions. Patient consistently missing lunch dose of Novolog. Discussed importance of coverage. AM fasting BG improving. A1c at next visit. Her updated medication list for this problem includes:    Metformin Hcl 500 Mg Tabs (Metformin hcl) .[name] Kitchen... Take 1 tablet by mouth twice a day    Lantus 100 Unit/ml Soln (Insulin glargine) .[name] KitchenMarland KitchenMarland KitchenMarland Kitchen 60 units in the am and 60 units in the pm. dispense one month supply.    Novolog 100 Unit/ml Soln (Insulin aspart) .[name] KitchenMarland KitchenMarland KitchenMarland Kitchen 15 units subcutaneous injection  prior to breakfast 20 units with lunch and 20 units with eveing meal.  disp: 1 month supply.  Orders: [place]- Est  Level 4 [id number])  Problem # 4:  PULMONARY EMBOLISM (ICD-415.19) Assessment: Unchanged Adjusted regimen. Her updated medication list for this problem includes:    Warfarin Sodium 5 Mg Tabs (Warfarin sodium) .[name] Kitchen... Take as directed  Orders: INR/PT-[facility] [id number]) [place]- Est  Level 4 [id number])  Complete Medication List: 1)  Advair Diskus 500-50 Mcg/dose Misc (Fluticasone-salmeterol) .[name] Kitchen.. 1 puff inh two times a day 2)  Allopurinol 300 Mg Tabs (Allopurinol) .... Take 1  tablet by mouth twice a day per dr trueslow 3)  Ascensia Elite Test Strp (Glucose blood) .... Test blood sugarfour times daily 4)  Doxepin Hcl 100 Mg Caps (Doxepin hcl) .... Take 1 capsule by mouth at bedtime 5)  Epipen 2-pak 0.3 Mg/0.31ml (1:1000) Devi (Epinephrine hcl (anaphylaxis)) .... Inject 1 pen subcutaneously as directed 6)  Lasix 80 Mg Tabs (Furosemide) .... Take 1 tablet by mouth twice a day 7)  Lexapro 10 Mg Tabs (Escitalopram oxalate) .... Take 1 tablet by mouth  once a day 8)  Metformin Hcl 500 Mg Tabs (Metformin hcl) .... Take 1 tablet by mouth twice a day 9)  Potassium Chloride Crys Cr 20 Meq Tbcr (Potassium chloride crys cr) .... Take 4  tabs by mouth in the am, 4 at noon, and 4 at hs 10)  Ranitidine Hcl 300 Mg Caps (Ranitidine hcl) .... Take 1 capsule by mouth once a day 11)  Zocor 10 Mg Tabs (Simvastatin) .... Take 1 tablet by mouth once a day 12)  Nexium 40 Mg Cpdr (Esomeprazole magnesium) .[name] KitchenMarland KitchenMarland Kitchen  1 tablet by mouth twice daily 13)  Calcium 500 500 Mg Tabs (Calcium carbonate) .[name] Kitchen.. 1 tablet by mouth daily 14)  Methotrexate 2.5 Mg Tabs (Methotrexate sodium) .... Take as directed by dr. Charlestine Night 10 mg (4 pills) on saturday and sunday 15)  Lyrica 100 Mg Caps (Pregabalin) .[name] Kitchen.. 1 tab by mouth three times a day 16)  Lantus 100 Unit/ml Soln (Insulin glargine) .... 60 units in the am and 60 units in the pm. dispense one month supply. 17)  Warfarin Sodium 5 Mg Tabs (Warfarin sodium) .... Take as directed 18)  Folic Acid 1 Mg Tabs (Folic acid) .[name] Kitchen.. 1 tablet by mouth daily while on methotrexate 19)  Clonidine Hcl 0.2 Mg Tabs (Clonidine hcl) .... 2 by mouth at bedtime for depression 20)  Doxepin Hcl 25 Mg Caps (Doxepin hcl) .... Take 1 capsule by mouth once a day at bedtime 21)  Iron 325 (65 Fe) Mg Tabs (Ferrous sulfate) .... One by mouth three times a day 22)  Novolog 100 Unit/ml Soln (Insulin aspart) .[name] Kitchen.. 15 units subcutaneous injection  prior to breakfast 20 units with lunch and 20 units with eveing meal.  disp: 1 month supply. 23)  Vitamin D 1000 Unit Tabs (Cholecalciferol) .... Take 1 tabs daily 24)  Bd Insulin Syringe Ultrafine 30g X 1/2" 1 Ml Misc (Insulin syringe-needle u-100) .... Dispense supply for four times per day dosing of insulin. 25)  Hydrocodone-acetaminophen 5-325 Mg Tabs (Hydrocodone-acetaminophen) .[name] Kitchen.. 1 by mouth up to 4 times per day as needed for pain 26)  Ventolin Hfa 108 (90 Base) Mcg/act Aers (Albuterol sulfate) .... Take one puff q4  hours. 27)  Spironolactone 100 Mg Tabs (Spironolactone) .... Once daily in am 28)  Prednisone 5 Mg Tabs (Prednisone) .... Once daily 29)  Bethanechol Chloride 5 Mg Tabs (Bethanechol chloride) .... One by mouth two times a day 30)  Diflucan 100 Mg Tabs (Fluconazole) .... One by mouth x 1 31)  Nystatin 100000 Unit/gm Powd (Nystatin) .... Apply to afected area two times a day as needed rash. for groin and pannus. 32)  Albuterol Sulfate (5 Mg/ml) 0.5% Nebu (Albuterol sulfate) .[name] Kitchen.. 1 treatment inhaled every 4 hours as needed wheezing/breathing tightness; dispense one box 33)  Fluticasone Propionate 50 Mcg/act Susp (Fluticasone propionate) .... 2 sprays in each nostril daily for allergies 34)  Cetirizine Hcl 10 Mg Tabs (Cetirizine hcl) .[name] Kitchen.. 1 tab by mouth daily during allergy season 35)  Compressor/nebulizer Misc (Nebulizers) .... Per instructions 36)  Alendronate Sodium 70 Mg Tabs (Alendronate sodium) .... One by mouth weekly per dr. Charlestine Night  Other Orders: Comp Met-[facility] [id number]) [place] Referral [facility])  Patient Instructions: 1)  It was nice to see you today! 2)  Increase Lantus to 70 units in the am and 70 units in the pm. 3)  Increase your Lantus by 1 unit every day as long as your am blood sugar is more than 120. When you get to 80 units in the am, start increasing your pm Lantus. 4)  When you get to Lantus 80 in the am and 80 in the pm, please send me a report of your blood sugars for me to review.   Orders Added: 1)  Comp Met-[facility] [id number] 2)  CK (Creatine Kinase)-[facility] [[id number]] 3)  INR/PT-[facility] [[id number]] 4)  Home Health Referral [Home Health] 5)  [facility]- Est  Level 4 [id number]     ANTICOAGULATION RECORD PREVIOUS REGIMEN & LAB RESULTS Anticoagulation Diagnosis:  PE on  11/22/2008 Previous INR Goal Range:  2-3 on  11/22/2008 Previous INR:  2.3 on  07/13/2010 Previous Coumadin Dose(mg):  5mg  tablets on  11/25/2008 Previous Regimen:  continue 5mg  M,Tu,W,Sat; 2.5mg   other days on  07/13/2010 Previous Coagulation Comments:  pt was in hospital this week - discharged yesterday on  09/19/2009  NEW REGIMEN & LAB RESULTS Current INR: 2.4 Regimen: 2.5 mg - M, W, F;    5 mg - Sun, Tues, Thurs, Sat  Provider: Juleen China Repeat testing in: 4 weeks  09-15-10 Other Comments: ...............test performed by......[name] KitchenBonnie A. Martinique, MLS (ASCP)cm   Dose has been reviewed with patient or caretaker during this visit. Reviewed by: Dr. Juleen China  Anticoagulation Visit Questionnaire Coumadin dose missed/changed:  No Abnormal Bleeding Symptoms:  No  Any diet changes including alcohol intake, vegetables or greens since the last visit:  No Any illnesses or hospitalizations since the last visit:  Yes      Recent Illness/Hospitalizations:  was in hospital on 07-31-10 for 2 days,  INR was 2.38 and dose was left the same Any signs of clotting since the last visit (including chest discomfort, dizziness, shortness of breath, arm tingling, slurred speech, swelling or redness in leg):  No  MEDICATIONS ADVAIR DISKUS 500-50 MCG/DOSE MISC (FLUTICASONE-SALMETEROL) 1 puff inh two times a day ALLOPURINOL 300 MG TABS (ALLOPURINOL) Take 1  tablet by mouth twice a day Per Dr Jolee Ewing [facility] TEST  STRP (GLUCOSE BLOOD) test blood sugarfour times daily DOXEPIN HCL 100 MG CAPS (DOXEPIN HCL) Take 1 capsule by mouth at bedtime EPIPEN 2-PAK 0.3 MG/0.3ML (1:1000) DEVI (EPINEPHRINE HCL (ANAPHYLAXIS)) Inject 1 pen subcutaneously as directed LASIX 80 MG TABS (FUROSEMIDE) Take 1 tablet by mouth twice a day LEXAPRO 10 MG TABS (ESCITALOPRAM OXALATE) Take 1 tablet by mouth once a day METFORMIN HCL 500 MG TABS (METFORMIN HCL) Take 1 tablet by mouth twice a day POTASSIUM CHLORIDE CRYS CR 20 MEQ TBCR (POTASSIUM CHLORIDE CRYS CR) take 4  tabs by mouth in the am, 4 at noon, and 4 at hs RANITIDINE HCL 300 MG CAPS (RANITIDINE HCL) Take 1 capsule by mouth once a day ZOCOR 10 MG TABS (SIMVASTATIN) Take 1  tablet by mouth once a day NEXIUM 40 MG  CPDR (ESOMEPRAZOLE MAGNESIUM) 1 tablet by mouth twice daily CALCIUM 500 500 MG  TABS (CALCIUM CARBONATE) 1 tablet by mouth daily METHOTREXATE 2.5 MG  TABS (METHOTREXATE SODIUM) take as directed by Dr. Charlestine Night 10 mg (4 pills) on Saturday and Sunday LYRICA 100 MG  CAPS (PREGABALIN) 1 tab by mouth three times a day LANTUS 100 UNIT/ML SOLN (INSULIN GLARGINE) 60 units in the am and 60 units in the pm. Dispense one month supply. WARFARIN SODIUM 5 MG TABS (WARFARIN SODIUM) take as directed FOLIC ACID 1 MG TABS (FOLIC ACID) 1 tablet by mouth daily while on methotrexate CLONIDINE HCL 0.2 MG TABS (CLONIDINE HCL) 2 by mouth at bedtime for depression DOXEPIN HCL 25 MG CAPS (DOXEPIN HCL) Take 1 capsule by mouth once a day at bedtime IRON 325 (65 FE) MG TABS (FERROUS SULFATE) one by mouth three times a day NOVOLOG 100 UNIT/ML SOLN (INSULIN ASPART) 15 units subcutaneous injection  prior to breakfast 20 units with lunch and 20 units with eveing meal.  Disp: 1 month supply. VITAMIN D 1000 UNIT TABS (CHOLECALCIFEROL) take 1 tabs daily BD INSULIN SYRINGE ULTRAFINE 30G X 1/2" 1 ML MISC (INSULIN SYRINGE-NEEDLE U-100) dispense supply for four times per day dosing of insulin. HYDROCODONE-ACETAMINOPHEN 5-325 MG TABS (HYDROCODONE-ACETAMINOPHEN) 1 by mouth up to 4  times per day as needed for pain VENTOLIN HFA 108 (90 BASE) MCG/ACT AERS (ALBUTEROL SULFATE) Take one puff Q4 hours. SPIRONOLACTONE 100 MG TABS (SPIRONOLACTONE) once daily in AM PREDNISONE 5 MG TABS (PREDNISONE) once daily BETHANECHOL CHLORIDE 5 MG TABS (BETHANECHOL CHLORIDE) one by mouth two times a day DIFLUCAN 100 MG TABS (FLUCONAZOLE) one by mouth x 1 NYSTATIN 100000 UNIT/GM POWD (NYSTATIN) apply to afected area two times a day as needed rash. for groin and pannus. ALBUTEROL SULFATE (5 MG/ML) 0.5% NEBU (ALBUTEROL SULFATE) 1 treatment inhaled every 4 hours as needed wheezing/breathing tightness; dispense one  box FLUTICASONE PROPIONATE 50 MCG/ACT SUSP (FLUTICASONE PROPIONATE) 2 sprays in each nostril daily for allergies CETIRIZINE HCL 10 MG TABS (CETIRIZINE HCL) 1 tab by mouth daily during allergy season COMPRESSOR/NEBULIZER  MISC (NEBULIZERS) per instructions ALENDRONATE SODIUM 70 MG TABS (ALENDRONATE SODIUM) one by mouth weekly per Dr. Charlestine Night     Prevention & Chronic Care Immunizations   Influenza vaccine: Fluvax 3+  (05/21/2009)   Influenza vaccine deferral: Not indicated  (08/18/2010)   Influenza vaccine due: 06/19/2008    Tetanus booster: 08/16/2001: Done.   Tetanus booster due: 08/17/2011    Pneumococcal vaccine: Done.  (09/16/2004)   Pneumococcal vaccine due: None    Immunization comments: already got fluvax at drug store  Colorectal Screening   Hemoccult: Done.  (12/14/2000)   Hemoccult due: Not Indicated    Colonoscopy: Done.  (03/16/2006)   Colonoscopy due: 03/16/2016  Other Screening   Pap smear: Done.  (08/16/2006)   Pap smear action/deferral: Deferred  (05/21/2009)   Pap smear due: 08/16/2009    Mammogram: ASSESSMENT: Negative - BI-RADS 1^MM DIGITAL SCREENING  (03/25/2009)   Mammogram action/deferral: Not indicated  (09/11/2009)   Mammogram due: 03/25/2010   Smoking status: quit  (08/18/2010)  Diabetes Mellitus   HgbA1C: 10.6  (05/25/2010)   Hemoglobin A1C due: 09/30/2007    Eye exam: Not documented    Foot exam: yes  (10/20/2009)   Foot exam action/deferral: Do today   High risk foot: Yes  (09/11/2009)   Foot care education: Done  (09/11/2009)   Foot exam due: 05/03/2008    Urine microalbumin/creatinine ratio: Not documented   Urine microalbumin action/deferral: Not indicated    Diabetes flowsheet reviewed?: Yes   Progress toward A1C goal: Unchanged  Lipids   Total Cholesterol: 171  (07/06/2010)   LDL: 66  (07/06/2010)   LDL Direct: 96  (02/28/2008)   HDL: 44  (07/06/2010)   Triglycerides: 306  (07/06/2010)    SGOT (AST): 19  (05/21/2009)    SGPT (ALT): 39  (05/21/2009) CMP ordered    Alkaline phosphatase: 99  (05/21/2009)   Total bilirubin: 0.5  (05/21/2009)    Lipid flowsheet reviewed?: Yes   Progress toward LDL goal: At goal  Hypertension   Last Blood Pressure: 125 / 76  (08/18/2010)   Serum creatinine: 1.18  (06/22/2010)   Serum potassium 3.8  (06/22/2010) CMP ordered     Hypertension flowsheet reviewed?: Yes   Progress toward BP goal: At goal  Self-Management Support :   Personal Goals (by the next clinic visit) :     Personal A1C goal: 8  (07/13/2009)     Personal blood pressure goal: 130/80  (05/21/2009)     Personal LDL goal: 100  (05/21/2009)    Patient will work on the following items until the next clinic visit to reach self-care goals:     Medications and monitoring: take my medicines every day, bring all of  my medications to every visit  (08/18/2010)     Eating: drink diet soda or water instead of juice or soda, eat more vegetables, use fresh or frozen vegetables, eat foods that are low in salt, eat baked foods instead of fried foods, eat fruit for snacks and desserts, limit or avoid alcohol  (08/18/2010)     Activity: take a 30 minute walk every day, take the stairs instead of the elevator, park at the far end of the parking lot  (07/06/2010)    Diabetes self-management support: CBG self-monitoring log, Written self-care plan  (08/18/2010)   Diabetes care plan printed    Hypertension self-management support: Written self-care plan  (08/18/2010)   Hypertension self-care plan printed.    Hypertension self-management support not done because: Good outcomes  (09/23/2009)    Lipid self-management support: Written self-care plan  (08/18/2010)   Lipid self-care plan printed.    Lipid self-management support not done because: Good outcomes  (09/23/2009)

## 2010-09-17 NOTE — Assessment & Plan Note (Signed)
Summary: Hospital Admission [name]   Primary Provider:  Briscoe Deutscher DO  CC:  CP and SOB.  History of Present Illness: This is a 60 y/o white female with multiple medical problems that include CHF, DM, chronic pain, asthma, and CKD who p/w chest pain and dyspnea.  CP located on Right side and described as stabbing, 10/10.  Non-radiating, non-reproducible.  Started at approx. 8pm white pt was watching TV and lasted until pt received Dilaudid in ED.  Currently, CP has resolved.  No associated N/V, diaphoresis. Pt did endorse dyspnea and was put on 2L O2 in ED.  She does not use home O2.    Pt also c/o a recent fall one week ago.  She hit her head on the dresser and "saw stars".  She denies any severe, worsening HA or neurological deficits.  Finally, pt c/o chronic pain located in hips bilaterally, R flank, and R shoulder.     ROS:  Endorses fatigue, chest pain, SOB, and hip/shoulder pain.  Denies any fevers, chills, sweats, N/V, or HA.  Denies abdominal pain, dysuria, constipation/diarrhea, or decreased appetite.    Current Medications (verified): 1)  Advair Diskus 500-50 Mcg/dose Misc (Fluticasone-Salmeterol) .[name] Kitchen.. 1 Puff Inh Two Times A Day 2)  Allopurinol 300 Mg Tabs (Allopurinol) .... Take 1  Tablet By Mouth Twice A Day Per Dr Jolee Ewing 3)  Ascensia Elite Test  Strp (Glucose Blood) .... Test Blood Sugarfour Times Daily 4)  Doxepin Hcl 100 Mg Caps (Doxepin Hcl) .... Take 1 Capsule By Mouth At Bedtime 5)  Epipen 2-Pak 0.3 Mg/0.14ml (1:1000) Devi (Epinephrine Hcl (Anaphylaxis)) .... Inject 1 Pen Subcutaneously As Directed 6)  Lasix 80 Mg Tabs (Furosemide) .... Take 1 Tablet By Mouth Twice A Day 7)  Lexapro 10 Mg Tabs (Escitalopram Oxalate) .... Take 1 Tablet By Mouth Once A Day 8)  Metformin Hcl 500 Mg Tabs (Metformin Hcl) .... Take 1 Tablet By Mouth Twice A Day 9)  Potassium Chloride Crys Cr 20 Meq Tbcr (Potassium Chloride Crys Cr) .... Take 4  Tabs By Mouth in The Am, 4 At Noon, and 4 At [facility] 10)   Ranitidine Hcl 300 Mg Caps (Ranitidine Hcl) .... Take 1 Capsule By Mouth Once A Day 11)  Zocor 10 Mg Tabs (Simvastatin) .... Take 1 Tablet By Mouth Once A Day 12)  Nexium 40 Mg  Cpdr (Esomeprazole Magnesium) .[name] Kitchen.. 1 Tablet By Mouth Twice Daily 13)  Calcium 500 500 Mg  Tabs (Calcium Carbonate) .[name] Kitchen.. 1 Tablet By Mouth Daily 14)  Methotrexate 2.5 Mg  Tabs (Methotrexate Sodium) .... Take As Directed By Dr. Charlestine Night 12.5 Mg (5 Pills) On Saturday and Sunday 15)  Lyrica 100 Mg  Caps (Pregabalin) .[name] Kitchen.. 1 Tab By Mouth Three Times A Day 16)  Lantus 100 Unit/ml Soln (Insulin Glargine) .... 60 Units in The Am and 60 Units in The Pm. Dispense One Month Supply. 17)  Warfarin Sodium 5 Mg Tabs (Warfarin Sodium) .... Take As Directed 18)  Folic Acid 1 Mg Tabs (Folic Acid) .[name] Kitchen.. 1 Tablet By Mouth Daily While On Methotrexate 19)  Clonidine Hcl 0.2 Mg Tabs (Clonidine Hcl) .... 2 By Mouth At Bedtime For Depression 20)  Doxepin Hcl 25 Mg Caps (Doxepin Hcl) .... Take 1 Capsule By Mouth Once A Day At Bedtime 21)  Iron 325 (65 Fe) Mg Tabs (Ferrous Sulfate) .... One By Mouth Three Times A Day 22)  Novolog 100 Unit/ml Soln (Insulin Aspart) .[name] Kitchen.. 15 Units Subcutaneous Injection  Prior To Breakfast 20 Units  With Lunch and 20 Units With Eveing Meal.  Disp: 1 Month Supply. 23)  Vitamin D 1000 Unit Tabs (Cholecalciferol) .... Take 1 Tabs Daily 24)  Bd Insulin Syringe Ultrafine 30g X 1/2" 1 Ml Misc (Insulin Syringe-Needle U-100) .... Dispense Supply For Four Times Per Day Dosing of Insulin. 25)  Hydrocodone-Acetaminophen 5-325 Mg Tabs (Hydrocodone-Acetaminophen) .[name] Kitchen.. 1 By Mouth Up To 4 Times Per Day As Needed For Pain 26)  Ventolin Hfa 108 (90 Base) Mcg/act Aers (Albuterol Sulfate) .... Take One Puff Q4 Hours. 27)  Spironolactone 100 Mg Tabs (Spironolactone) .... Once Daily in Am 28)  Prednisone 5 Mg Tabs (Prednisone) .... Once Daily 29)  Bethanechol Chloride 5 Mg Tabs (Bethanechol Chloride) .... One By Mouth Two Times A Day 30)   Diflucan 100 Mg Tabs (Fluconazole) .... One By Mouth X 1 31)  Nystatin 100000 Unit/gm Powd (Nystatin) .... Apply To Afected Area Two Times A Day As Needed Rash. For Groin and Pannus. 32)  Albuterol Sulfate (5 Mg/ml) 0.5% Nebu (Albuterol Sulfate) .[name] Kitchen.. 1 Treatment Inhaled Every 4 Hours As Needed Wheezing/breathing Tightness; Dispense One Box 33)  Fluticasone Propionate 50 Mcg/act Susp (Fluticasone Propionate) .... 2 Sprays in Each Nostril Daily For Allergies 34)  Cetirizine Hcl 10 Mg Tabs (Cetirizine Hcl) .[name] Kitchen.. 1 Tab By Mouth Daily During Allergy Season 35)  Compressor/nebulizer  Misc (Nebulizers) .... Per Instructions  Allergies (verified): 1)  ! Codeine 2)  ! Penicillin 3)  ! Sulfa 4)  ! * Lithium 5)  ! Valium 6)  ! * Cogentin 7)  ! Lisinopril (Lisinopril) 8)  ! * Hyddal 9)  ! Diphenhydramine Hcl (Diphenhydramine Hcl) 10)  Thioridazine Hcl (Thioridazine Hcl)  Past History:  Past Medical History: Last updated: 09/16/2009 1. Amenorrhea since 60 years old 2. Barretts Esophagus - EGD 8/07. (Repeat 2-3 years) 3. Nephrolithiasis 2/03 4. Colonoscopy- benign polyps (12/2000), repeat colonoscopy performed in 2007 5. Endometrial biopsy 10/03 benign columnar mucosa (limited material) 6. Asthma: PFT's (10/14/06)- mild obstructive airway dz, poor effort 7. Obesity-hypoventilation syndrome: uses CPAP at night. 8. Obesity 9. Diastolic CHF: EF [id number] on echo (5/10), repeat ECHO (08/29/09) EF 0000000, Grade 1 Diastolic Dsfxn, Lt atrium mildly dilated, PA systolic pressure mildly elevated. 10.  Pulmonary embolus (4/10): ? trigger--on Warfarin 11.  Acute pericarditis/pleuritis (5/10): Echo (5/10) showed EF 55%, possible posterior HK, > 25% respiratory variation of mitral inflow suggesting some interventricular interdependence, IVC not significantly dilated.  There was a small organized pericardial effusion and a moderate left pleural effusion. Some evidence suggesting constrictive physiology.  ESR was 108.  Patient treated with high dose ASA and colchicine.  12. CKD: last creatinine 1.12 13. Chronic anemia 14. Diabetes mellitus type II 15. Gout 16. Rheumatoid arthritis (followed by Dr. Charlestine Night). 17. Depression 18. GERD 19. HTN 20. Hyperlipidemia 21. Schizophrenia 22. Polypharmacy 23. Falls 24. Hypokalemia 25. Vitamin D 22 (10/6) 26. Fibromyalgia  Family History: Reviewed history from 04/25/2009 and no changes required. 6 brothers and 6 sisters., Father died of black lung, Mother with bone cancer, One brother with lung cancer and another with` blood` cancer., One sister died of colon cancer., Strong FH of DM, CAD, and kidney problems  Social History: Reviewed history from 09/16/2009 and no changes required. Divorced for greater than 30 years.  Born in [place] but lived in [place] for most of her life. No children. Stopped smoking in 1973, no etoh, no drug use.  Pt was adopted.  Not working secondary to schizophrenia.  Live with mom & niece Helene Kelp  who helps care for her).  Not sexually active.  Review of Systems       per HPI  Physical Exam  General:  Temp 98.8, RR 18-20, HR 103-106, BP 114-138/72-79 alert, well-nourished, and cooperative to examination.   Head:  normocephalic and atraumatic.   Eyes:  pupils equal, pupils round, pupils reactive to light, and no injection.   Mouth:  Oral mucosa and oropharynx without lesions or exudates.  Neck:  No deformities, masses, or tenderness noted. Lungs:  R decreased breath sounds, L decreased breath sounds, and L diffuse crackles.   Heart:  regular rhythm, no murmur, no gallop, no rub, no JVD, and tachycardia.   Abdomen:  normal bowel sounds, no guarding, and moderately distended.  TTP diffusely, mostly R flank pain. Msk:  no joint swelling, no joint warmth, no redness over joints, and decreased ROM.   Pulses:  dorsalis pedis and posterior tibial pulses are full and equal bilaterally Extremities:  1+ left pedal edema and 1+ right pedal edema.     Neurologic:  Decreased strength RUE.  Decreased sensation bilateral LE (soles of feet).alert & oriented X3, cranial nerves II-XII intact, and finger-to-nose normal.   Psych:  Oriented X3, normally interactive, and not anxious appearing.   Additional Exam:   WBC                                     10.1              RBC                                      3.80        Hemoglobin (HGB)                13.5                Hematocrit (HCT)                  38.3               Platelet Count (PLT)              184    Sodium (NA)                        131         Potassium (K)                      3.0        Chloride                               95          CO2                                     29                  Glucose                               369  BUN                                     23                  Creatinine                            1.22        INR: 2.69 POC CE: Negative x1  CXR:  1.  Hypoexpanded  lungs; mild bibasilar opacities may reflect atelectasis or pneumonia. 2.  Mild cardiomegaly.   Impression & Recommendations:  Problem # 1:  Shortness of breath Likely secondary to pneumonia vs. acute CHF exacerbation.  Pt is afebrile and WBC normal.  Will hold Avelox for now and repeat CXR in 24 hrs.   Pt does have a hx of chronic diastolic CHF, EF [id number].  Will get BNP now.  We plan to continue home regimen of Lasix 80mg  po BID.  May need to titrate up if needed.  Will monitor daily weights and strict ins/outs.  Will order O2 as needed O2 sat >92%.   Problem # 2:  Chest Pain POC CE were negative x1.  Will order cardiac enzymes once now and then q8hrs.    Problem # 3:  DIABETES MELLITUS, TYPE I, UNCONTROLLED (ICD-250.03) Will use SSI for now.  Her updated medication list for this problem includes:    Metformin Hcl 500 Mg Tabs (Metformin hcl) .[name] Kitchen... Take 1 tablet by mouth twice a day    Lantus 100 Unit/ml Soln (Insulin glargine) .[name] KitchenMarland KitchenMarland KitchenMarland Kitchen 60 units in the am and 60 units  in the pm. dispense one month supply.    Novolog 100 Unit/ml Soln (Insulin aspart) .[name] KitchenMarland KitchenMarland KitchenMarland Kitchen 15 units subcutaneous injection  prior to breakfast 20 units with lunch and 20 units with eveing meal.  disp: 1 month supply.  Problem # 4:  DIASTOLIC HEART FAILURE, CHRONIC (ICD-428.32) Will continue home dose for diureses.  See #1.    Her updated medication list for this problem includes:    Lasix 80 Mg Tabs (Furosemide) .[name] Kitchen... Take 1 tablet by mouth twice a day    Warfarin Sodium 5 Mg Tabs (Warfarin sodium) .[name] Kitchen... Take as directed    Spironolactone 100 Mg Tabs (Spironolactone) ..... 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  Will continue home regimen of Vicodin 5/325 qh6  as needed for pain.  Will monitor.  Problem # 6:  ASTHMA, PERSISTENT (ICD-493.90) Pt was not wheezing on PE, however she dyspneic and requiring O2.  Will continue to give Advair and Albuterol.    Her updated medication list for this problem includes:    Advair Diskus 500-50 Mcg/dose Misc (Fluticasone-salmeterol) .[name] Kitchen... 1 puff inh two times a day    Ventolin Hfa 108 (90 Base) Mcg/act Aers (Albuterol sulfate) .[name] Kitchen... Take one puff q4 hours.    Prednisone 5 Mg Tabs (Prednisone) ..... Once daily    Albuterol Sulfate (5 Mg/ml) 0.5% Nebu (Albuterol sulfate) .[name] Kitchen... 1 treatment inhaled every 4 hours as needed wheezing/breathing tightness; dispense one box  Problem # 7:  FEN/GI CHO/HH diet.  Hep lock IV.  Problem # 8:  PPx Warfarin per pharm once CT head comes back negative.  Problem # 9:  Recent Falls Will place patient on fall precautions.  Will consult PT.  Will get a CT head to r/o intracranial bleed since pt is on warfarin.  Problem # 10:  Full Code  Problem # 11:  Disposition Pending clinical improvement.  Complete Medication List: 1)  Advair Diskus 500-50 Mcg/dose Misc (Fluticasone-salmeterol) .[name] Kitchen.. 1 puff inh two times a day 2)  Allopurinol 300 Mg Tabs (Allopurinol) ....  Take 1  tablet by mouth twice a day per dr trueslow 3)  Ascensia Elite Test Strp (Glucose blood) .... Test blood sugarfour times daily 4)  Doxepin Hcl 100 Mg Caps (Doxepin hcl) .... Take 1 capsule by mouth at bedtime 5)  Epipen 2-pak 0.3 Mg/0.22ml (1:1000) Devi (Epinephrine hcl (anaphylaxis)) .... Inject 1 pen subcutaneously as directed 6)  Lasix 80 Mg Tabs (Furosemide) .... Take 1 tablet by mouth twice a day 7)  Lexapro 10 Mg Tabs (Escitalopram oxalate) .... Take 1 tablet by mouth once a day 8)  Metformin Hcl 500 Mg Tabs (Metformin hcl) .... Take 1 tablet by mouth twice a day 9)  Potassium Chloride Crys Cr 20 Meq Tbcr (Potassium chloride crys cr) .... Take 4  tabs by mouth in the am, 4 at noon, and 4 at hs 10)  Ranitidine Hcl 300 Mg Caps (Ranitidine hcl) .... Take 1 capsule by mouth once a day 11)  Zocor 10 Mg Tabs (Simvastatin) .... Take 1 tablet by mouth once a day 12)  Nexium 40 Mg Cpdr (Esomeprazole magnesium) .[name] Kitchen.. 1 tablet by mouth twice daily 13)  Calcium 500 500 Mg Tabs (Calcium carbonate) .[name] Kitchen.. 1 tablet by mouth daily 14)  Methotrexate 2.5 Mg Tabs (Methotrexate sodium) .... Take as directed by dr. Charlestine Night 12.5 mg (5 pills) on saturday and sunday 15)  Lyrica 100 Mg Caps (Pregabalin) .[name] Kitchen.. 1 tab by mouth three times a day 16)  Lantus 100 Unit/ml Soln (Insulin glargine) .... 60 units in the am and 60 units in the pm. dispense one month supply. 17)  Warfarin Sodium 5 Mg Tabs (Warfarin sodium) .... Take as directed 18)  Folic Acid 1 Mg Tabs (Folic acid) .[name] Kitchen.. 1 tablet by mouth daily while on methotrexate 19)  Clonidine Hcl 0.2 Mg Tabs (Clonidine hcl) .... 2 by mouth at bedtime for depression 20)  Doxepin Hcl 25 Mg Caps (Doxepin hcl) .... Take 1 capsule by mouth once a day at bedtime 21)  Iron 325 (65 Fe) Mg Tabs (Ferrous sulfate) .... One by mouth three times a day 22)  Novolog 100 Unit/ml Soln (Insulin aspart) .[name] Kitchen.. 15 units subcutaneous injection  prior to breakfast 20 units with lunch and 20  units with eveing meal.  disp: 1 month supply. 23)  Vitamin D 1000 Unit Tabs (Cholecalciferol) .... Take 1 tabs daily 24)  Bd Insulin Syringe Ultrafine 30g X 1/2" 1 Ml Misc (Insulin syringe-needle u-100) .... Dispense supply for four times per day dosing of insulin. 25)  Hydrocodone-acetaminophen 5-325 Mg Tabs (Hydrocodone-acetaminophen) .[name] Kitchen.. 1 by mouth up to 4 times per day as needed for pain 26)  Ventolin Hfa 108 (90 Base) Mcg/act Aers (Albuterol sulfate) .... Take one puff q4 hours. 27)  Spironolactone 100 Mg Tabs (Spironolactone) .... Once daily in am 28)  Prednisone 5 Mg Tabs (Prednisone) .... Once daily 29)  Bethanechol Chloride 5 Mg Tabs (Bethanechol chloride) .... One by mouth two times a day 30)  Diflucan 100 Mg Tabs (Fluconazole) .... One by mouth x 1 31)  Nystatin 100000 Unit/gm Powd (Nystatin) .... Apply to afected area two times a day as needed rash. for groin and pannus. 32)  Albuterol Sulfate (5 Mg/ml) 0.5% Nebu (Albuterol sulfate) .[name] Kitchen.. 1 treatment inhaled every 4 hours as needed wheezing/breathing tightness; dispense one box 33)  Fluticasone Propionate 50 Mcg/act Susp (Fluticasone propionate) .... 2 sprays in each nostril daily for allergies 34)  Cetirizine Hcl 10 Mg Tabs (Cetirizine hcl) .[name] Kitchen.. 1 tab by mouth daily during allergy season 35)  Compressor/nebulizer Misc (Nebulizers) .... Per instructions

## 2010-09-17 NOTE — Progress Notes (Signed)
  Phone Note Other Incoming Call back at [phone]   Summary of Call: Niece calling regarding rx for Doxephin 25 mg not called into pharmacy.  Can only pick up now due to transportation issues.  Will wait at pharmacy unti rx called or faxed back.  If any questions or concerns call cell ph# of neice  Initial call taken by: Eusebio Friendly,  June 04, 2010 10:16 AM

## 2010-09-23 ENCOUNTER — Ambulatory Visit (INDEPENDENT_AMBULATORY_CARE_PROVIDER_SITE_OTHER): Payer: Medicaid Other | Admitting: *Deleted

## 2010-09-23 DIAGNOSIS — Z7901 Long term (current) use of anticoagulants: Secondary | ICD-10-CM

## 2010-09-23 DIAGNOSIS — I2699 Other pulmonary embolism without acute cor pulmonale: Secondary | ICD-10-CM

## 2010-09-23 LAB — PROTIME-INR
INR: 4.5 — ABNORMAL HIGH (ref <1.50)
Prothrombin Time: 42.6 seconds — ABNORMAL HIGH (ref 11.6–15.2)

## 2010-09-23 LAB — POCT INR: INR: 5.2

## 2010-09-25 ENCOUNTER — Encounter: Payer: Self-pay | Admitting: Family Medicine

## 2010-09-25 DIAGNOSIS — I2699 Other pulmonary embolism without acute cor pulmonale: Secondary | ICD-10-CM

## 2010-09-25 DIAGNOSIS — Z7901 Long term (current) use of anticoagulants: Secondary | ICD-10-CM | POA: Insufficient documentation

## 2010-09-28 ENCOUNTER — Other Ambulatory Visit: Payer: Self-pay | Admitting: Family Medicine

## 2010-09-28 ENCOUNTER — Emergency Department (HOSPITAL_COMMUNITY): Payer: Medicaid Other

## 2010-09-28 ENCOUNTER — Emergency Department (HOSPITAL_COMMUNITY)
Admission: EM | Admit: 2010-09-28 | Discharge: 2010-09-28 | Disposition: A | Discharge status: Home / Self Care | Payer: Medicaid Other | Attending: Emergency Medicine | Admitting: Emergency Medicine

## 2010-09-28 ENCOUNTER — Ambulatory Visit: Payer: Medicaid Other

## 2010-09-28 DIAGNOSIS — F068 Other specified mental disorders due to known physiological condition: Secondary | ICD-10-CM | POA: Insufficient documentation

## 2010-09-28 DIAGNOSIS — F209 Schizophrenia, unspecified: Secondary | ICD-10-CM | POA: Insufficient documentation

## 2010-09-28 DIAGNOSIS — M79609 Pain in unspecified limb: Secondary | ICD-10-CM | POA: Insufficient documentation

## 2010-09-28 DIAGNOSIS — E119 Type 2 diabetes mellitus without complications: Secondary | ICD-10-CM | POA: Insufficient documentation

## 2010-09-28 DIAGNOSIS — M25569 Pain in unspecified knee: Secondary | ICD-10-CM | POA: Insufficient documentation

## 2010-09-28 DIAGNOSIS — R55 Syncope and collapse: Secondary | ICD-10-CM | POA: Insufficient documentation

## 2010-09-28 DIAGNOSIS — Z86711 Personal history of pulmonary embolism: Secondary | ICD-10-CM | POA: Insufficient documentation

## 2010-09-28 DIAGNOSIS — J45909 Unspecified asthma, uncomplicated: Secondary | ICD-10-CM | POA: Insufficient documentation

## 2010-09-28 DIAGNOSIS — I10 Essential (primary) hypertension: Secondary | ICD-10-CM | POA: Insufficient documentation

## 2010-09-28 DIAGNOSIS — W010XXA Fall on same level from slipping, tripping and stumbling without subsequent striking against object, initial encounter: Secondary | ICD-10-CM | POA: Insufficient documentation

## 2010-09-28 LAB — POCT I-STAT, CHEM 8
BUN: 39 mg/dL — ABNORMAL HIGH (ref 6–23)
Calcium, Ion: 1.2 mmol/L (ref 1.12–1.32)
Chloride: 99 mEq/L (ref 96–112)
Creatinine, Ser: 1.7 mg/dL — ABNORMAL HIGH (ref 0.4–1.2)
Glucose, Bld: 450 mg/dL — ABNORMAL HIGH (ref 70–99)
HCT: 44 % (ref 36.0–46.0)
Hemoglobin: 15 g/dL (ref 12.0–15.0)
Potassium: 4.9 mEq/L (ref 3.5–5.1)
Sodium: 132 mEq/L — ABNORMAL LOW (ref 135–145)
TCO2: 26 mmol/L (ref 0–100)

## 2010-09-28 MED ORDER — FLUTICASONE-SALMETEROL 500-50 MCG/DOSE IN AEPB: 1.0000 | INHALATION_SPRAY | Freq: Two times a day (BID) | RESPIRATORY_TRACT | Status: DC

## 2010-09-28 MED ORDER — PREGABALIN 100 MG PO CAPS: 100.0000 mg | ORAL_CAPSULE | Freq: Three times a day (TID) | ORAL | Status: DC

## 2010-09-30 ENCOUNTER — Emergency Department (HOSPITAL_COMMUNITY): Payer: Medicaid Other

## 2010-09-30 ENCOUNTER — Inpatient Hospital Stay (HOSPITAL_COMMUNITY)
Admission: EM | Admit: 2010-09-30 | Discharge: 2010-10-06 | DRG: 948 | Disposition: A | Discharge status: Skilled Nursing Facility | Payer: Medicaid Other | Source: Ambulatory Visit | Attending: Family Medicine | Admitting: Family Medicine

## 2010-09-30 ENCOUNTER — Encounter: Payer: Self-pay | Admitting: Family Medicine

## 2010-09-30 DIAGNOSIS — K219 Gastro-esophageal reflux disease without esophagitis: Secondary | ICD-10-CM | POA: Diagnosis present

## 2010-09-30 DIAGNOSIS — E785 Hyperlipidemia, unspecified: Secondary | ICD-10-CM | POA: Diagnosis present

## 2010-09-30 DIAGNOSIS — G4733 Obstructive sleep apnea (adult) (pediatric): Secondary | ICD-10-CM | POA: Diagnosis present

## 2010-09-30 DIAGNOSIS — F2089 Other schizophrenia: Secondary | ICD-10-CM

## 2010-09-30 DIAGNOSIS — IMO0002 Reserved for concepts with insufficient information to code with codable children: Secondary | ICD-10-CM

## 2010-09-30 DIAGNOSIS — M109 Gout, unspecified: Secondary | ICD-10-CM | POA: Diagnosis present

## 2010-09-30 DIAGNOSIS — E119 Type 2 diabetes mellitus without complications: Secondary | ICD-10-CM | POA: Diagnosis present

## 2010-09-30 DIAGNOSIS — Z7982 Long term (current) use of aspirin: Secondary | ICD-10-CM

## 2010-09-30 DIAGNOSIS — M069 Rheumatoid arthritis, unspecified: Secondary | ICD-10-CM | POA: Diagnosis present

## 2010-09-30 DIAGNOSIS — E662 Morbid (severe) obesity with alveolar hypoventilation: Secondary | ICD-10-CM | POA: Diagnosis present

## 2010-09-30 DIAGNOSIS — J449 Chronic obstructive pulmonary disease, unspecified: Secondary | ICD-10-CM | POA: Diagnosis present

## 2010-09-30 DIAGNOSIS — F209 Schizophrenia, unspecified: Secondary | ICD-10-CM | POA: Diagnosis present

## 2010-09-30 DIAGNOSIS — Z96659 Presence of unspecified artificial knee joint: Secondary | ICD-10-CM

## 2010-09-30 DIAGNOSIS — Z9181 History of falling: Secondary | ICD-10-CM

## 2010-09-30 DIAGNOSIS — N189 Chronic kidney disease, unspecified: Secondary | ICD-10-CM | POA: Diagnosis present

## 2010-09-30 DIAGNOSIS — I872 Venous insufficiency (chronic) (peripheral): Secondary | ICD-10-CM | POA: Diagnosis present

## 2010-09-30 DIAGNOSIS — I129 Hypertensive chronic kidney disease with stage 1 through stage 4 chronic kidney disease, or unspecified chronic kidney disease: Secondary | ICD-10-CM | POA: Diagnosis present

## 2010-09-30 DIAGNOSIS — Z7901 Long term (current) use of anticoagulants: Secondary | ICD-10-CM

## 2010-09-30 DIAGNOSIS — I5032 Chronic diastolic (congestive) heart failure: Secondary | ICD-10-CM | POA: Diagnosis present

## 2010-09-30 DIAGNOSIS — Z86711 Personal history of pulmonary embolism: Secondary | ICD-10-CM

## 2010-09-30 DIAGNOSIS — Z794 Long term (current) use of insulin: Secondary | ICD-10-CM

## 2010-09-30 DIAGNOSIS — E669 Obesity, unspecified: Secondary | ICD-10-CM | POA: Diagnosis present

## 2010-09-30 DIAGNOSIS — Z888 Allergy status to other drugs, medicaments and biological substances status: Secondary | ICD-10-CM

## 2010-09-30 DIAGNOSIS — R5381 Other malaise: Principal | ICD-10-CM | POA: Diagnosis present

## 2010-09-30 DIAGNOSIS — Z882 Allergy status to sulfonamides status: Secondary | ICD-10-CM

## 2010-09-30 DIAGNOSIS — I509 Heart failure, unspecified: Secondary | ICD-10-CM | POA: Diagnosis present

## 2010-09-30 DIAGNOSIS — N179 Acute kidney failure, unspecified: Secondary | ICD-10-CM | POA: Diagnosis present

## 2010-09-30 DIAGNOSIS — Z79899 Other long term (current) drug therapy: Secondary | ICD-10-CM

## 2010-09-30 DIAGNOSIS — F0390 Unspecified dementia without behavioral disturbance: Secondary | ICD-10-CM | POA: Diagnosis present

## 2010-09-30 DIAGNOSIS — E118 Type 2 diabetes mellitus with unspecified complications: Secondary | ICD-10-CM

## 2010-09-30 DIAGNOSIS — J4489 Other specified chronic obstructive pulmonary disease: Secondary | ICD-10-CM | POA: Diagnosis present

## 2010-09-30 DIAGNOSIS — Z880 Allergy status to penicillin: Secondary | ICD-10-CM

## 2010-09-30 DIAGNOSIS — N390 Urinary tract infection, site not specified: Secondary | ICD-10-CM | POA: Diagnosis present

## 2010-09-30 LAB — URINE MICROSCOPIC-ADD ON

## 2010-09-30 LAB — GLUCOSE, CAPILLARY
Glucose-Capillary: 421 mg/dL — ABNORMAL HIGH (ref 70–99)
Glucose-Capillary: 503 mg/dL — ABNORMAL HIGH (ref 70–99)
Glucose-Capillary: 536 mg/dL — ABNORMAL HIGH (ref 70–99)

## 2010-09-30 LAB — CBC
HCT: 37.2 % (ref 36.0–46.0)
Hemoglobin: 13.3 g/dL (ref 12.0–15.0)
MCH: 35.2 pg — ABNORMAL HIGH (ref 26.0–34.0)
MCHC: 35.8 g/dL (ref 30.0–36.0)
MCV: 98.4 fL (ref 78.0–100.0)
Platelets: 168 10*3/uL (ref 150–400)
RBC: 3.78 MIL/uL — ABNORMAL LOW (ref 3.87–5.11)
RDW: 14.3 % (ref 11.5–15.5)
WBC: 11.9 10*3/uL — ABNORMAL HIGH (ref 4.0–10.5)

## 2010-09-30 LAB — BASIC METABOLIC PANEL
BUN: 49 mg/dL — ABNORMAL HIGH (ref 6–23)
CO2: 23 mEq/L (ref 19–32)
Calcium: 8.8 mg/dL (ref 8.4–10.5)
Chloride: 97 mEq/L (ref 96–112)
Creatinine, Ser: 1.38 mg/dL — ABNORMAL HIGH (ref 0.4–1.2)
GFR calc Af Amer: 48 mL/min — ABNORMAL LOW (ref >60)
GFR calc non Af Amer: 39 mL/min — ABNORMAL LOW (ref >60)
Glucose, Bld: 543 mg/dL — ABNORMAL HIGH (ref 70–99)
Potassium: 3.6 mEq/L (ref 3.5–5.1)
Sodium: 132 mEq/L — ABNORMAL LOW (ref 135–145)

## 2010-09-30 LAB — DIFFERENTIAL
Basophils Absolute: 0 10*3/uL (ref 0.0–0.1)
Basophils Relative: 0 % (ref 0–1)
Eosinophils Absolute: 0.1 10*3/uL (ref 0.0–0.7)
Eosinophils Relative: 0 % (ref 0–5)
Lymphocytes Relative: 8 % — ABNORMAL LOW (ref 12–46)
Lymphs Abs: 1 10*3/uL (ref 0.7–4.0)
Monocytes Absolute: 0.4 10*3/uL (ref 0.1–1.0)
Monocytes Relative: 3 % (ref 3–12)
Neutro Abs: 10.4 10*3/uL — ABNORMAL HIGH (ref 1.7–7.7)
Neutrophils Relative %: 88 % — ABNORMAL HIGH (ref 43–77)

## 2010-09-30 LAB — URINALYSIS, ROUTINE W REFLEX MICROSCOPIC
Bilirubin Urine: NEGATIVE
Hgb urine dipstick: NEGATIVE
Ketones, ur: NEGATIVE mg/dL
Nitrite: POSITIVE — AB
Protein, ur: NEGATIVE mg/dL
Specific Gravity, Urine: 1.016 (ref 1.005–1.030)
Urine Glucose, Fasting: >1000 mg/dL — AB
Urobilinogen, UA: 0.2 mg/dL (ref 0.0–1.0)
pH: 6 (ref 5.0–8.0)

## 2010-09-30 NOTE — H&P (Signed)
Hospital Admission Note  Patient name: [name] Medical record number: [id number] Date of birth: 1950/09/29 Age: 60 y.o. Gender: female   Date of Admission: 09/30/10  Attending physician: Dr. Dalbert Mayotte     Resident (R2/R3): Dr. Esmeralda Arthur     Resident (R1): Dr. Cletus Gash       Chief Complaint:Weakness and Falls  History of Present Illness: Pt states that she fell this afternoon and was unable to get up.  She says just before falling she felt dizzy and her legs gave way.  After falling her legs were too weak to get up.  She says she was on the ground for more than an hour before her neighbor heard her dog barking and came outside to help her.  Denies loss of consciousness.  Pt. States that she fell two days ago and went to [facility] ED and was discharged home. She had a CT head that was negative.  Pt says she has fallen three times in her house.  She has been having more and more leg weakness over the past two months.  She has had a cough and nasal congestion for about two months but no fevers of chills. Pt also mentions that she has had some chest wall pain in her right side since her first fall two days ago.  She says she has also been more short of breath than usual.  She complains of feeling very thirsty, and low back pain, and urinary frequency and dysuria.   Current Outpatient Prescriptions  Medication Sig Dispense Refill  . albuterol (PROVENTIL) (5 MG/ML) 0.5% nebulizer solution Take 2.5 mg by nebulization every 4 (four) hours as needed. For wheezing or tightness       . albuterol (VENTOLIN HFA) 108 (90 BASE) MCG/ACT inhaler Inhale 1 puff into the lungs every 4 (four) hours.        [name] Kitchen alendronate (FOSAMAX) 70 MG tablet Take 70 mg by mouth every 7 (seven) days. Take in the morning with a full glass of water, on an empty stomach, and do not take anything else by mouth or lie down for the next 30 min.       [name] Kitchen allopurinol (ZYLOPRIM) 300 MG tablet Take 300 mg by mouth 2 (two)  times daily.        . bethanechol (URECHOLINE) 5 MG tablet Take 5 mg by mouth 2 (two) times daily.        . calcium carbonate (TUMS) 500 MG chewable tablet Chew 1 tablet by mouth daily.        . cetirizine (ZYRTEC) 10 MG tablet Take 10 mg by mouth daily. During allergy season        . Cholecalciferol 1000 UNIT capsule Take 1,000 Units by mouth daily.        . cloNIDine (CATAPRES) 0.2 MG tablet Take 2 tabs by mouth at bedtime for depression      . doxepin (SINEQUAN) 100 MG capsule Take 100 mg by mouth at bedtime.        [name] Kitchen doxepin (SINEQUAN) 25 MG capsule Take 25 mg by mouth at bedtime.        [name] Kitchen EPINEPHrine (EPIPEN) 0.3 mg/0.3 mL DEVI Use as directed       . escitalopram (LEXAPRO) 10 MG tablet Take 10 mg by mouth daily.        [name] Kitchen esomeprazole (NEXIUM) 40 MG capsule Take 40 mg by mouth 2 (two) times daily.        . ferrous sulfate 325 (  65 FE) MG tablet Take 325 mg by mouth 3 (three) times daily.        . fluconazole (DIFLUCAN) 100 MG tablet Take 100 mg by mouth once.        . fluticasone (FLONASE) 50 MCG/ACT nasal spray 2 sprays by Nasal route daily. Each nostril       . Fluticasone-Salmeterol (ADVAIR DISKUS) 500-50 MCG/DOSE AEPB Inhale 1 puff into the lungs 2 (two) times daily.  60 each  6  . folic acid (FOLVITE) 1 MG tablet Take 1 mg by mouth daily. Take while on methotrexate       . furosemide (LASIX) 80 MG tablet Take 80 mg by mouth 2 (two) times daily.        [name] Kitchen glucose blood (ASCENSIA ELITE TEST STRIPS) test strip Check blood sugar 4 times daily       . HYDROcodone-acetaminophen (NORCO) 5-325 MG per tablet Take 1 tablet by mouth up to 4 times a day as needed for pain      . insulin aspart (NOVOLOG) 100 UNIT/ML injection Inject 15 Units into the skin 3 (three) times daily before meals. 15 units before breakfast & 20 units with lunch & 20 with pm meal       . insulin glargine (LANTUS) 100 UNIT/ML injection Inject 60 Units into the skin 2 (two) times daily. Give in am & in pm       . Insulin  Syringe-Needle U-100 (B-D INS SYR ULTRAFINE 1CC/30G) 30G X 1/2" 1 ML MISC Dispense QS for 4 times per day dosing.       . metFORMIN (GLUCOPHAGE) 500 MG tablet Take 500 mg by mouth 2 (two) times daily.        . methotrexate 2.5 MG tablet Take by mouth as directed. Per Dr. Estrella Deeds pills on Saturday & on sunday       . Nebulizers (COMPRESSOR/NEBULIZER) MISC Per instructions       . nystatin (MYCOSTATIN) powder Apply topically 2 (two) times daily. To groin & pannus prn      . potassium chloride (KLOR-CON) 20 MEQ packet Take 4 tabs by mouth in the morning, 4 at night, and 4 at bedtime.      . predniSONE (DELTASONE) 5 MG tablet Take 5 mg by mouth daily.        . pregabalin (LYRICA) 100 MG capsule Take 1 capsule (100 mg total) by mouth 3 (three) times daily.  90 capsule  3  . ranitidine (ZANTAC) 300 MG capsule Take 300 mg by mouth daily.       . simvastatin (ZOCOR) 10 MG tablet Take 10 mg by mouth daily.       [name] Kitchen spironolactone (ALDACTONE) 100 MG tablet Take 100 mg by mouth every morning.       . warfarin (COUMADIN) 5 MG tablet as directed.         Allergies: Benztropine mesylate; Codeine; Diazepam; Diphenhydramine hcl; Lisinopril; Penicillins; Sulfonamide derivatives; and Thioridazine hcl  PMHx: 1. Amenorrhea since 60 years old 2. Barretts Esophagus - EGD 8/07. (Repeat 2-3 years) 3. Nephrolithiasis 2/03 4. Colonoscopy- benign polyps (12/2000), repeat colonoscopy performed in 2007 5. Endometrial biopsy 10/03 benign columnar mucosa (limited material) 6. Asthma: PFT's (10/14/06)- mild obstructive airway dz, poor effort 7. Obesity-hypoventilation syndrome: uses CPAP at night. 8. Obesity 9. Diastolic CHF: EF [id number] on echo (5/10), repeat ECHO (08/29/09) EF 0000000, Grade 1 Diastolic Dsfxn, Lt atrium mildly dilated, PA systolic pressure mildly elevated. 10.  Pulmonary embolus (4/10): ? trigger--on Warfarin  11.  Acute pericarditis/pleuritis (5/10): Echo (5/10) showed EF 55%, possible posterior HK, > 25%  respiratory variation of mitral inflow suggesting some interventricular interdependence, IVC not significantly dilated.  There was a small organized pericardial effusion and a moderate left pleural effusion. Some evidence suggesting constrictive physiology.  ESR was 108. Patient treated with high dose ASA and colchicine.  12. CKD: last creatinine 1.12 13. Chronic anemia 14. Diabetes mellitus type II 15. Gout 16. Rheumatoid arthritis (followed by Dr. Charlestine Night). 17. Depression 18. GERD 19. HTN 20. Hyperlipidemia 21. Schizophrenia 22. Polypharmacy 23. Falls 24. Hypokalemia 25. Vitamin D 22 (10/6) 26. Fibromyalgia  Family History: 6 brothers and 6 sisters., Father died of black lung, Mother with bone cancer, One brother with lung cancer and another with` blood` cancer., One sister died of colon cancer., Strong FH of DM, CAD, and kidney problems  Social History: Divorced for greater than 30 years.  Born in [place] but lived in [place] for most of her life. No children. Stopped smoking in 1973, no etoh, no drug use.  Pt was adopted.  Not working secondary to schizophrenia.  Live with mom & niece Helene Kelp who helps care for her).  Not sexually active.  Review of Systems: A comprehensive review of systems was negative except for: Noted in HPI.   Physical Exam:  Vitals: T 98.0 P 89 BP 111/76  RR 19  Pulse Ox: 99% RA.  General appearance: alert, cooperative and no distress Eyes: conjunctivae/corneas clear. PERRL, EOM's intact. Fundi benign. Nose: Nares normal. Septum midline. Mucosa normal. No drainage or sinus tenderness., no discharge Throat: abnormal findings: dry mucous membranes, no lesions or exudates Neck: no adenopathy, no carotid bruit, no JVD, supple, symmetrical, trachea midline and thyroid not enlarged, symmetric, no tenderness/mass/nodules Back: + TTP in lumbar spine, no CVA tendernesss Lungs: wheezes base - bilateral, No crackles.  Heart: regular rate and rhythm, S1, S2 normal, no murmur,  click, rub or gallop Abdomen: soft, non-tender; bowel sounds normal; no masses,  no organomegaly Extremities: Trace LE Edema, Venous stasis hyperpigmentation changes of skin Pulses: 2+ and symmetric Neurologic: Grossly normal   Lab results:   Chemistry      Component Value Date/Time   NA 132* 09/30/2010 2020   K 3.6 09/30/2010 2020   CL 97 09/30/2010 2020   CO2 23 09/30/2010 2020   BUN 49* 09/30/2010 2020   CREATININE 1.38* 09/30/2010 2020  Glucose: 543    Component Value Date/Time   CALCIUM 8.8 09/30/2010 2020   ALKPHOS 107 08/18/2010 2201   AST 24 08/18/2010 2201   ALT 32 08/18/2010 2201   BILITOT 0.5 08/18/2010 2201     Lab Results  Component Value Date   WBC 11.9* 09/30/2010   HGB 13.3 09/30/2010   HCT 37.2 09/30/2010   MCV 98.4 09/30/2010   PLT 168 09/30/2010    Neutrophils, %                           88         h      43-77            %  Lymphocytes, %                           8          l      12-46            %  Monocytes, %                             3                 3-12             %  Eosinophils, %                           0                 0-5              %  Basophils, %                             0                 0-1              %  Neutrophils, Absolute                    10.4       h      1.7-7.7          K/uL  Lymphocytes, Absolute                    1.0               0.7-4.0          K/uL  Monocytes, Absolute                      0.4               0.1-1.0          K/uL  Eosinophils, Absolute                    0.1               0.0-0.7          K/uL  Basophils, Absolute                      0.0               0.0-0.1          K/uL  Urinalysis: Color, Urine                             YELLOW            YELLOW  Appearance                               CLEAR             CLEAR  Specific Gravity                         1.016             1.005-1.030  pH                                       6.0  5.0-8.0  Urine Glucose                            >1000       a      NEG              mg/dL  Bilirubin                                NEGATIVE          NEG  Ketones                                  NEGATIVE          NEG              mg/dL  Blood                                    NEGATIVE          NEG  Protein                                  NEGATIVE          NEG              mg/dL  Urobilinogen                             0.2               0.0-1.0          mg/dL  Nitrite                                  POSITIVE   a      NEG  Leukocytes                               SMALL      a      NEG  Urine Micro: Squamous Epithelial / LPF                RARE              RARE  WBC / HPF                                7-10              <3               WBC/hpf  RBC / HPF                                0-2               <3               RBC/hpf  Bacteria / HPF  MANY       a      RARE  Lab Results  Component Value Date   INR 5.2 09/23/2010   INR 4.50* 09/23/2010   INR 2.4 08/18/2010     Imaging results:  CXR 2-View:  Low lung volumes with retrocardiac atelectasis. XRay Pelvis, 2 view: No acute fracture or dislocation identified about the pelvis. XRay Hip2 View: No acute fracture or dislocation identified about the right hip.  Other results: EKG: Sinustachycardia, LVH, unchanged from previous.   Assessment & Plan by Problem: 60 year old female who presents with CC of Falls, found to be hyperglycemic and Urinalysis suggestive of a UTI: 1) Falls- possible due to deconditioning and combination of UTI and hyperglycemia.  Will treat those two problems as below.  Will consult PT/OT to evaluate patient's mobility.  Will admit to telemetry bed to monitor for arrhythmias. 2) Hyperglycemia likely secondary to poor medication compliance and possible infection.  Pt. On glucomander drip, will continue until CBG's less than 250, continue NS@ 125 cc/hour.  Will transition to SQ Lantus when CBG less than 250.  3) UTI- patient has urinary incontinence at  baseline but complaining of dysuria and UA and Micro indicate UTI, also mildly elevated WBC. Urine culture sent, will continue Ceftriaxone until Urine culture and sensitivities back. 4) Acute on Chronic Renal Failure- pt with cr. 1.38, baseline 1.1.  Will Hold lasix as she appears dehydrated on exam and creatinine above baseline.  Monitor fluid status.  5) Hx of Pulmonary Embolus- pt with recent supratherapeutic INR in office.  Will recheck INR and dose per pharmacy.  6) COPD- continue albuterol PRN.  7) OSA- continue CPAP QHS 8) HTN- continue clonidine 9) RA- continue methotrexate and prednisone 10) Chronic pain- hydrocodone/APAP.  11) Hx of Diastolic CHF- monitor fluid status.  Pt. Dry on exam.  Hold lasix, spirinolactone, and K-dur.  12) Psych- pt has schizophrenia, continue Lexapro 13) HLD- pt was to stop statin due to controlled cholesterol.  Will hold.  14) FEN/GI- Will allow pt to eat and drink, carb consistent diet.  Continue NS@125  cc/hr until glucose normalizes.  Check electrolytes in am. Continue PPI. 15) DVT PPX- Heprin 5000 U SQ TID. 16) Dispo- Pending clinical improvement.

## 2010-10-01 ENCOUNTER — Telehealth: Payer: Self-pay | Admitting: Family Medicine

## 2010-10-01 LAB — CBC
HCT: 35.6 % — ABNORMAL LOW (ref 36.0–46.0)
Hemoglobin: 12.4 g/dL (ref 12.0–15.0)
MCH: 34.6 pg — ABNORMAL HIGH (ref 26.0–34.0)
MCHC: 34.8 g/dL (ref 30.0–36.0)
MCV: 99.4 fL (ref 78.0–100.0)
Platelets: 163 10*3/uL (ref 150–400)
RBC: 3.58 MIL/uL — ABNORMAL LOW (ref 3.87–5.11)
RDW: 14.3 % (ref 11.5–15.5)
WBC: 9.5 10*3/uL (ref 4.0–10.5)

## 2010-10-01 LAB — BASIC METABOLIC PANEL
BUN: 35 mg/dL — ABNORMAL HIGH (ref 6–23)
BUN: 26 mg/dL — ABNORMAL HIGH (ref 6–23)
CO2: 28 mEq/L (ref 19–32)
CO2: 26 mEq/L (ref 19–32)
Calcium: 8.5 mg/dL (ref 8.4–10.5)
Calcium: 8.2 mg/dL — ABNORMAL LOW (ref 8.4–10.5)
Chloride: 100 mEq/L (ref 96–112)
Chloride: 101 mEq/L (ref 96–112)
Creatinine, Ser: 1.09 mg/dL (ref 0.4–1.2)
Creatinine, Ser: 1.02 mg/dL (ref 0.4–1.2)
GFR calc Af Amer: >60 mL/min (ref >60)
GFR calc Af Amer: >60 mL/min (ref >60)
GFR calc non Af Amer: 52 mL/min — ABNORMAL LOW (ref >60)
GFR calc non Af Amer: 56 mL/min — ABNORMAL LOW (ref >60)
Glucose, Bld: 138 mg/dL — ABNORMAL HIGH (ref 70–99)
Glucose, Bld: 403 mg/dL — ABNORMAL HIGH (ref 70–99)
Potassium: 2.7 mEq/L — CL (ref 3.5–5.1)
Potassium: 3.7 mEq/L (ref 3.5–5.1)
Sodium: 137 mEq/L (ref 135–145)
Sodium: 134 mEq/L — ABNORMAL LOW (ref 135–145)

## 2010-10-01 LAB — GLUCOSE, CAPILLARY
Glucose-Capillary: 135 mg/dL — ABNORMAL HIGH (ref 70–99)
Glucose-Capillary: 136 mg/dL — ABNORMAL HIGH (ref 70–99)
Glucose-Capillary: 145 mg/dL — ABNORMAL HIGH (ref 70–99)
Glucose-Capillary: 149 mg/dL — ABNORMAL HIGH (ref 70–99)
Glucose-Capillary: 186 mg/dL — ABNORMAL HIGH (ref 70–99)
Glucose-Capillary: 247 mg/dL — ABNORMAL HIGH (ref 70–99)
Glucose-Capillary: 250 mg/dL — ABNORMAL HIGH (ref 70–99)
Glucose-Capillary: 270 mg/dL — ABNORMAL HIGH (ref 70–99)
Glucose-Capillary: 332 mg/dL — ABNORMAL HIGH (ref 70–99)
Glucose-Capillary: 342 mg/dL — ABNORMAL HIGH (ref 70–99)
Glucose-Capillary: 362 mg/dL — ABNORMAL HIGH (ref 70–99)

## 2010-10-01 LAB — DIFFERENTIAL
Basophils Absolute: 0 10*3/uL (ref 0.0–0.1)
Basophils Relative: 0 % (ref 0–1)
Eosinophils Absolute: 0.2 10*3/uL (ref 0.0–0.7)
Eosinophils Relative: 2 % (ref 0–5)
Lymphocytes Relative: 15 % (ref 12–46)
Lymphs Abs: 1.4 10*3/uL (ref 0.7–4.0)
Monocytes Absolute: 0.8 10*3/uL (ref 0.1–1.0)
Monocytes Relative: 8 % (ref 3–12)
Neutro Abs: 7.1 10*3/uL (ref 1.7–7.7)
Neutrophils Relative %: 75 % (ref 43–77)

## 2010-10-01 LAB — PROTIME-INR
INR: 1.26 (ref 0.00–1.49)
Prothrombin Time: 16 seconds — ABNORMAL HIGH (ref 11.6–15.2)

## 2010-10-02 ENCOUNTER — Telehealth: Payer: Self-pay | Admitting: Family Medicine

## 2010-10-02 LAB — CBC
HCT: 37.6 % (ref 36.0–46.0)
Hemoglobin: 12.9 g/dL (ref 12.0–15.0)
MCH: 34.5 pg — ABNORMAL HIGH (ref 26.0–34.0)
MCHC: 34.3 g/dL (ref 30.0–36.0)
MCV: 100.5 fL — ABNORMAL HIGH (ref 78.0–100.0)
Platelets: 138 10*3/uL — ABNORMAL LOW (ref 150–400)
RBC: 3.74 MIL/uL — ABNORMAL LOW (ref 3.87–5.11)
RDW: 14.8 % (ref 11.5–15.5)
WBC: 5.9 10*3/uL (ref 4.0–10.5)

## 2010-10-02 LAB — PROTIME-INR
INR: 1.13 (ref 0.00–1.49)
Prothrombin Time: 14.7 seconds (ref 11.6–15.2)

## 2010-10-02 LAB — BASIC METABOLIC PANEL
BUN: 15 mg/dL (ref 6–23)
CO2: 26 mEq/L (ref 19–32)
Calcium: 8.5 mg/dL (ref 8.4–10.5)
Chloride: 107 mEq/L (ref 96–112)
Creatinine, Ser: 0.81 mg/dL (ref 0.4–1.2)
GFR calc Af Amer: >60 mL/min (ref >60)
GFR calc non Af Amer: >60 mL/min (ref >60)
Glucose, Bld: 241 mg/dL — ABNORMAL HIGH (ref 70–99)
Potassium: 3.5 mEq/L (ref 3.5–5.1)
Sodium: 139 mEq/L (ref 135–145)

## 2010-10-02 LAB — URINE CULTURE
Colony Count: >=100000
Culture  Setup Time: 201202152144

## 2010-10-02 LAB — GLUCOSE, CAPILLARY
Glucose-Capillary: 240 mg/dL — ABNORMAL HIGH (ref 70–99)
Glucose-Capillary: 281 mg/dL — ABNORMAL HIGH (ref 70–99)

## 2010-10-02 NOTE — Telephone Encounter (Signed)
Ms Coy has been admitted for weakness, falls, and UTI.  She states she has to be on Coumadin for life.  Do you know why that is?  She seems to have had only one episode of PE.  She has been falling so we would like to d/c coumadin if possible. Thanks.

## 2010-10-03 LAB — GLUCOSE, CAPILLARY
Glucose-Capillary: 295 mg/dL — ABNORMAL HIGH (ref 70–99)
Glucose-Capillary: 268 mg/dL — ABNORMAL HIGH (ref 70–99)
Glucose-Capillary: 235 mg/dL — ABNORMAL HIGH (ref 70–99)
Glucose-Capillary: 296 mg/dL — ABNORMAL HIGH (ref 70–99)
Glucose-Capillary: 267 mg/dL — ABNORMAL HIGH (ref 70–99)

## 2010-10-03 LAB — PROTIME-INR
INR: 1.28 (ref 0.00–1.49)
Prothrombin Time: 16.2 seconds — ABNORMAL HIGH (ref 11.6–15.2)

## 2010-10-04 LAB — GLUCOSE, CAPILLARY
Glucose-Capillary: 269 mg/dL — ABNORMAL HIGH (ref 70–99)
Glucose-Capillary: 167 mg/dL — ABNORMAL HIGH (ref 70–99)
Glucose-Capillary: 262 mg/dL — ABNORMAL HIGH (ref 70–99)
Glucose-Capillary: 291 mg/dL — ABNORMAL HIGH (ref 70–99)
Glucose-Capillary: 170 mg/dL — ABNORMAL HIGH (ref 70–99)

## 2010-10-04 LAB — PROTIME-INR
INR: 1.6 — ABNORMAL HIGH (ref 0.00–1.49)
Prothrombin Time: 19.2 seconds — ABNORMAL HIGH (ref 11.6–15.2)

## 2010-10-04 LAB — BASIC METABOLIC PANEL
BUN: 8 mg/dL (ref 6–23)
CO2: 25 mEq/L (ref 19–32)
Calcium: 8.7 mg/dL (ref 8.4–10.5)
Chloride: 108 mEq/L (ref 96–112)
Creatinine, Ser: 0.87 mg/dL (ref 0.4–1.2)
GFR calc Af Amer: >60 mL/min (ref >60)
GFR calc non Af Amer: >60 mL/min (ref >60)
Glucose, Bld: 181 mg/dL — ABNORMAL HIGH (ref 70–99)
Potassium: 4.3 mEq/L (ref 3.5–5.1)
Sodium: 140 mEq/L (ref 135–145)

## 2010-10-04 LAB — CBC
HCT: 37.3 % (ref 36.0–46.0)
Hemoglobin: 12.7 g/dL (ref 12.0–15.0)
MCH: 34.1 pg — ABNORMAL HIGH (ref 26.0–34.0)
MCHC: 34 g/dL (ref 30.0–36.0)
MCV: 100.3 fL — ABNORMAL HIGH (ref 78.0–100.0)
Platelets: 149 10*3/uL — ABNORMAL LOW (ref 150–400)
RBC: 3.72 MIL/uL — ABNORMAL LOW (ref 3.87–5.11)
RDW: 15.5 % (ref 11.5–15.5)
WBC: 6.1 10*3/uL (ref 4.0–10.5)

## 2010-10-04 NOTE — Telephone Encounter (Signed)
I discussed this issue with [name] and her niece last year. She and her niece endorsed a Hx of multiple PEs and I did not verify this by reviewing her chart. After reviewing, I only see one episode of PE in 2010. I would be okay with discontinuing Coumadin in this patient with only one documented PE and a Hx of falls.

## 2010-10-05 LAB — GLUCOSE, CAPILLARY
Glucose-Capillary: 177 mg/dL — ABNORMAL HIGH (ref 70–99)
Glucose-Capillary: 282 mg/dL — ABNORMAL HIGH (ref 70–99)
Glucose-Capillary: 263 mg/dL — ABNORMAL HIGH (ref 70–99)
Glucose-Capillary: 219 mg/dL — ABNORMAL HIGH (ref 70–99)

## 2010-10-05 LAB — PROTIME-INR
INR: 2.12 — ABNORMAL HIGH (ref 0.00–1.49)
Prothrombin Time: 23.9 seconds — ABNORMAL HIGH (ref 11.6–15.2)

## 2010-10-06 LAB — GLUCOSE, CAPILLARY
Glucose-Capillary: 227 mg/dL — ABNORMAL HIGH (ref 70–99)
Glucose-Capillary: 314 mg/dL — ABNORMAL HIGH (ref 70–99)
Glucose-Capillary: 325 mg/dL — ABNORMAL HIGH (ref 70–99)

## 2010-10-08 NOTE — Telephone Encounter (Signed)
Called and spoke with pt's niece. She reports, that every thing is all right and she does not need further help. [name]

## 2010-10-08 NOTE — Telephone Encounter (Signed)
Please find out what this is about 

## 2010-10-12 NOTE — H&P (Signed)
[name], KERSTIENS NO.:  [id number]  MEDICAL RECORD NO.:  [id number]           PATIENT TYPE:  I  LOCATION:  2029                         FACILITY:  [facility]  PHYSICIAN:  Dalbert Mayotte, MD        DATE OF BIRTH:  22-Aug-1950  DATE OF ADMISSION:  09/30/2010 DATE OF DISCHARGE:                             HISTORY & PHYSICAL   PRIMARY CARE PROVIDER:  Briscoe Deutscher, MD at [facility].  CHIEF COMPLAINT:  Weakness and falls.  HISTORY OF PRESENT ILLNESS:  The patient states that this afternoon she fell and she was unable to get up.  She says that just before she fell, she felt dizzy and her legs gave away.  After falling, she says her legs were too weak for her to be able to get up.  She says that she was on the ground for more than an hour before her neighbor heard her dog barking, and came outside to help her; however, she denies loss of consciousness.  The patient states that she also fell 2 days ago and went to the [facility] Emergency Department and was discharged home after having a CT of the head that was negative.  The patient states that she has fallen 3 times in her house recently as well.  She says that she has been having more and more leg weakness over the past 2 months.  She also says she has had a cough and nasal congestion for about 2 months, but denies fevers or chills.  She also mentions that she has had some chest wall pain in her right side since the first fall 2 days ago.  She says that she has also been more short of breath than usual.  She complains of feeling very thirsty.  She complains of low back pain and urinary frequency and dysuria.  REVIEW OF SYMPTOMS:  Negative except noted in the HPI.  CURRENT MEDICATIONS: 1. Albuterol 2.5 mg nebs q.4 h. p.r.n. wheezing. 2. Albuterol inhaler 1 puff q.4 h. p.r.n. shortness of breath. 3. Fosamax 70 mg p.o. q.7 days. 4. Allopurinol 300 mg b.i.d. 5. Bethanechol 5 mg b.i.d. 6. Tums 500 mg  p.o. daily. 7. Zyrtec 10 mg p.o. daily. 8. Vitamin D3 1000 units p.o. daily. 9. Catapres 0.2 mg p.o. nightly. 10.Doxepin 25 mg p.o. nightly. 11.EpiPen 0.3 mg/mL injection p.r.n. 12.Lexapro 10 mg p.o. daily. 13.Nexium 40 mg p.o. b.i.d. 14.Ferrous sulfate 325 mg p.o. t.i.d. 15.Flonase 50 mcg 2 sprays each nostril daily. 16.Advair Diskus 5/50, inhale 1 puff b.i.d. 17.Folic acid 1 mg p.o. daily. 18.Lasix 80 mg p.o. b.i.d. 19.Hydrocodone and acetaminophen 5/325 one tablet up to 4 times a day     p.r.n. pain. 20.NovoLog 15 units subcu t.i.d. before meals. 21.Lantus 60 units subcu b.i.d. 22.Metformin 500 mg p.o. b.i.d. 23.Methotrexate 2.5 mg p.o. 4 pills on Saturday and Sunday. 24.Nystatin powder, apply topically b.i.d. to the affected area. 25.Klor-Con 20 mEq, take 4 tablets p.o. in the morning, 4 nightly. 26.Prednisone 5 mg p.o. daily. 27.Lyrica 100 mg p.o. t.i.d. 28.Zantac 300 mg p.o. daily. 29.Zocor 10 mg p.o. daily. 30.Aldactone 100  mg p.o. q.a.m. 31.Coumadin 5 mg p.o. daily.  ALLERGIES: 1. BENZTROPINE MESYLATE. 2. CODEINE. 3. DIAZEPAM. 4. DIPHENHYDRAMINE. 5. LISINOPRIL. 6. PENICILLIN. 7. SULFONAMIDES. 8. TYROSINE.  PAST MEDICAL HISTORY: 1. Amenorrhea since 60 years old. 2. Barrett esophagus. 3. Nephrolithiasis. 4. Asthma. 5. Obesity hypoventilation syndrome/obstructive sleep apnea. 6. Obesity. 7. Diastolic congestive heart failure with ejection fraction of 55%. 8. History of pulmonary embolism in April 2010. 9. Chronic kidney disease, baseline creatinine 1.12. 10.Chronic anemia. 11.Diabetes mellitus, type 2. 12.Gout. 13.Rheumatoid arthritis. 14.Depression. 15.GERD. 16.Hypertension. 17.Hyperlipidemia. 18.Schizophrenia. 19.Hypokalemia. 20.Vitamin D deficiency. 21.Fibromyalgia.  FAMILY HISTORY:  The patient has six brothers and six sisters.  Her father died of black lung, mother of bone cancer, brother with lung cancer, another brother with blood cancer.   One sister died of colon cancer.  Multiple family members with diabetes and coronary artery disease and kidney problems.  SOCIAL HISTORY:  The patient is divorced.  She is a previous smoker. Stopped smoking in 1973.  Denies alcohol or drug use.  She is disabled secondary to schizophrenia.  She lives with her mom and her niece, Clarene Critchley, who helps to care for her.  PHYSICAL EXAMINATION:  VITALS:  Temperature 98.0, pulse 89, blood pressure 111/76, respiratory rate 19, and pulse ox 99% on room air. GENERAL APPEARANCE:  The patient is alert, cooperative, in no acute distress. EYES:  Conjunctive and cornea is clear.  Pupils are equal, round, and reactive to light.  Extraocular movements intact. NOSE:  Nares are normal.  Septum midline.  Mucosa normal.  No drainage. THROAT:  Dry mucous membranes.  No lesions or exudates. NECK:  No adenopathy.  No JVD.  No thyromegaly. BACK:  The patient has positive tenderness to palpation in her mid lumbar spine, however, no CVA tenderness. LUNGS:  The patient has bibasilar wheezes.  No crackles. HEART:  Regular rate and rhythm.  No murmurs, rubs, or gallops. ABDOMEN:  Soft, nontender, obese.  Bowel sounds normal.  No masses or organomegaly. EXTREMITIES:  The patient with trace lower extremity edema.  Venous stasis, hyperpigmentation, changes of her skin PULSES:  2+ and symmetric. NEUROLOGIC:  Grossly normal.  LABORATORY RESULTS:  Metabolic panel:  Sodium Q000111Q, potassium 3.6, chloride 97, CO2 of 23, BUN 49, creatinine 1.38, glucose 543, calcium 8.8.  CBC:  White blood cell count 11.9, hemoglobin 13.3, hematocrit 37.2, platelets 168, neutrophils 88%, lymphocytes 8%, monocytes 3%. Urinalysis positive for greater than 1000 urine glucose, positive nitrites, small leukocyte.  Urine microscopic, rare epithelial cells, 7- 10 white blood cells, 0-2 red blood cells, and many bacteria.  RADIOLOGY: 1. Chest x-ray, two-view, shows low lung volumes with  retrocardiac     atelectasis. 2. X-ray of the pelvis, two-view, shows no acute fracture or     dislocation identified about the pelvis. 3. X-ray of the hip, two-view, shows no acute fracture or dislocation     identified about the right hip. 4. Other results on EKG shows sinus tachycardia with left ventricular     hypertrophy unchanged from previous.  ASSESSMENT AND PLAN:  This is a 60 year old female who presents with a chief complaint of falls, found to be hyperglycemic with urinalysis suggestive of urinary tract infection. 1. Falls.  This is possibly due to deconditioning and a combination of     urinary tract infection and hyperglycemia.  We will treat these two     problems.  See below.  We will consult Physical Therapy and     Occupational Therapy to evaluate the patient's mobility.  We will     also admit the patient to telemetry bed to monitor for any     arrhythmias as a cause of her falls. 2. Hyperglycemia.  This is likely secondary to poor medication     compliance and exacerbated by possible urinary tract infection.     The patient is on the Glucommander drip in the emergency     department.  We will continue the drip until CBGs are less than     250.  We will continue normal saline at 125 mL an hour.  When CBG     is less than 250, we will transition to subcu Lantus. 3. Urinary tract infection.  The patient had urinary incontinence at     baseline but is complaining of dysuria.  The patient has elevated     white blood cell count; and urinalysis and urine microscopy     indicate urinary tract infection.  Urine culture has been sent.  We     will continue IV ceftriaxone until urine culture and sensitivities     are back. 4. Acute on chronic renal failure.  The patient has a creatinine 1.38     here in the emergency department.  Her baseline is 1.1.  We will     order Lasix as she appears dehydrated on exam and creatinine is     elevated.  We will monitor her fluid status  carefully. 5. History of pulmonary embolism.  The patient with recent     subtherapeutic INR.  In clinic, INR was 5.2 on September 23, 2010.     We will recheck her INR in dose per pharmacy or hold pending what     the INR result is. 6. Chronic obstructive pulmonary disease.  We will continue p.r.n.     albuterol. 7. Obstructive sleep apnea.  We will continue CPAP nightly. 8. Hypertension.  We will continue clonidine. 9. Rheumatoid arthritis.  We will continue methotrexate and     prednisone. 10.Chronic pain.  We will continue hydrocodone and acetaminophen and     Lyrica. 0000000 of diastolic congestive heart failure.  We will monitor the     patient's fluid status.  She is dry on exam.  We will hold her     Lasix, spironolactone, and K-Dur until she is euvolemic. 12.Psych.  The patient has history of schizophrenia and depression.     She takes Lexapro.  We will continue this. 13.Hyperlipidemia.  The patient was supposed to stop her statin after     her last hospitalization, but she seems to continue that.  We will     hold it during this hospitalization and discontinue at discharge. 14.Fluids, electrolytes, nutrition/gastrointestinal.  We will the     patient to eat, drink, and have a carbohydrate-consistent diet.  We     will continue normal saline at 125 mL an hour until glucose     normalizes.  We will check electrolytes and creatinine again in the     a.m.  We will continue proton pump inhibitor. 15.Deep vein thrombosis prophylaxis.  Heparin 5000 units subcu t.i.d..  DISPOSITION:  Pending clinical improvement.    ______________________________ Cletus Gash, MD   ______________________________ Dalbert Mayotte, MD    CR/MEDQ  D:  10/01/2010  T:  10/01/2010  Job:  [id number]  Electronically Signed by Cletus Gash MD on 10/05/2010 03:32:46 PM Electronically Signed by Dalbert Mayotte MD on 10/12/2010 09:47:37 AM

## 2010-10-14 ENCOUNTER — Emergency Department (HOSPITAL_COMMUNITY)
Admission: EM | Admit: 2010-10-14 | Discharge: 2010-10-15 | Disposition: A | Discharge status: Home / Self Care | Payer: Medicaid Other | Attending: Emergency Medicine | Admitting: Emergency Medicine

## 2010-10-14 ENCOUNTER — Emergency Department (HOSPITAL_COMMUNITY): Payer: Medicaid Other

## 2010-10-14 DIAGNOSIS — R059 Cough, unspecified: Secondary | ICD-10-CM | POA: Insufficient documentation

## 2010-10-14 DIAGNOSIS — J449 Chronic obstructive pulmonary disease, unspecified: Secondary | ICD-10-CM | POA: Insufficient documentation

## 2010-10-14 DIAGNOSIS — J4489 Other specified chronic obstructive pulmonary disease: Secondary | ICD-10-CM | POA: Insufficient documentation

## 2010-10-14 DIAGNOSIS — Z86711 Personal history of pulmonary embolism: Secondary | ICD-10-CM | POA: Insufficient documentation

## 2010-10-14 DIAGNOSIS — R0609 Other forms of dyspnea: Secondary | ICD-10-CM | POA: Insufficient documentation

## 2010-10-14 DIAGNOSIS — Z794 Long term (current) use of insulin: Secondary | ICD-10-CM | POA: Insufficient documentation

## 2010-10-14 DIAGNOSIS — J40 Bronchitis, not specified as acute or chronic: Secondary | ICD-10-CM | POA: Insufficient documentation

## 2010-10-14 DIAGNOSIS — R0602 Shortness of breath: Secondary | ICD-10-CM | POA: Insufficient documentation

## 2010-10-14 DIAGNOSIS — I10 Essential (primary) hypertension: Secondary | ICD-10-CM | POA: Insufficient documentation

## 2010-10-14 DIAGNOSIS — R0989 Other specified symptoms and signs involving the circulatory and respiratory systems: Secondary | ICD-10-CM | POA: Insufficient documentation

## 2010-10-14 DIAGNOSIS — E119 Type 2 diabetes mellitus without complications: Secondary | ICD-10-CM | POA: Insufficient documentation

## 2010-10-14 DIAGNOSIS — R05 Cough: Secondary | ICD-10-CM | POA: Insufficient documentation

## 2010-10-14 DIAGNOSIS — I509 Heart failure, unspecified: Secondary | ICD-10-CM | POA: Insufficient documentation

## 2010-10-14 DIAGNOSIS — F068 Other specified mental disorders due to known physiological condition: Secondary | ICD-10-CM | POA: Insufficient documentation

## 2010-10-15 LAB — CBC
HCT: 34.9 % — ABNORMAL LOW (ref 36.0–46.0)
Hemoglobin: 11.9 g/dL — ABNORMAL LOW (ref 12.0–15.0)
MCH: 34.1 pg — ABNORMAL HIGH (ref 26.0–34.0)
MCHC: 34.1 g/dL (ref 30.0–36.0)
MCV: 100 fL (ref 78.0–100.0)
Platelets: 253 10*3/uL (ref 150–400)
RBC: 3.49 MIL/uL — ABNORMAL LOW (ref 3.87–5.11)
RDW: 13.9 % (ref 11.5–15.5)
WBC: 7 10*3/uL (ref 4.0–10.5)

## 2010-10-15 LAB — DIFFERENTIAL
Basophils Absolute: 0.1 10*3/uL (ref 0.0–0.1)
Basophils Relative: 1 % (ref 0–1)
Eosinophils Absolute: 0.2 10*3/uL (ref 0.0–0.7)
Eosinophils Relative: 2 % (ref 0–5)
Lymphocytes Relative: 13 % (ref 12–46)
Lymphs Abs: 0.9 10*3/uL (ref 0.7–4.0)
Monocytes Absolute: 0.9 10*3/uL (ref 0.1–1.0)
Monocytes Relative: 13 % — ABNORMAL HIGH (ref 3–12)
Neutro Abs: 4.9 10*3/uL (ref 1.7–7.7)
Neutrophils Relative %: 70 % (ref 43–77)

## 2010-10-15 LAB — COMPREHENSIVE METABOLIC PANEL
ALT: 22 U/L (ref 0–35)
AST: 20 U/L (ref 0–37)
Albumin: 2.8 g/dL — ABNORMAL LOW (ref 3.5–5.2)
Alkaline Phosphatase: 85 U/L (ref 39–117)
BUN: 10 mg/dL (ref 6–23)
CO2: 27 mEq/L (ref 19–32)
Calcium: 8.8 mg/dL (ref 8.4–10.5)
Chloride: 103 mEq/L (ref 96–112)
Creatinine, Ser: 0.79 mg/dL (ref 0.4–1.2)
GFR calc Af Amer: >60 mL/min (ref >60)
GFR calc non Af Amer: >60 mL/min (ref >60)
Glucose, Bld: 181 mg/dL — ABNORMAL HIGH (ref 70–99)
Potassium: 3.1 mEq/L — ABNORMAL LOW (ref 3.5–5.1)
Sodium: 138 mEq/L (ref 135–145)
Total Bilirubin: 0.7 mg/dL (ref 0.3–1.2)
Total Protein: 5.7 g/dL — ABNORMAL LOW (ref 6.0–8.3)

## 2010-10-15 LAB — PROTIME-INR
INR: 1.05 (ref 0.00–1.49)
Prothrombin Time: 13.9 seconds (ref 11.6–15.2)

## 2010-10-15 LAB — CK TOTAL AND CKMB (NOT AT ARMC)
CK, MB: 0.9 ng/mL (ref 0.3–4.0)
Relative Index: INVALID (ref 0.0–2.5)
Total CK: 30 U/L (ref 7–177)

## 2010-10-15 LAB — BRAIN NATRIURETIC PEPTIDE: Pro B Natriuretic peptide (BNP): 143 pg/mL — ABNORMAL HIGH (ref 0.0–100.0)

## 2010-10-15 LAB — TROPONIN I: Troponin I: 0.01 ng/mL (ref 0.00–0.06)

## 2010-10-24 IMAGING — CR DG HIP (WITH OR WITHOUT PELVIS) 2-3V*L*
3 series · 3 of 3 positions shown · non-contrast
Comparison: None.

CLINICAL DATA: Left hip pain.

LEFT HIP - COMPLETE 2+ VIEW

[t pelvis a.p.]
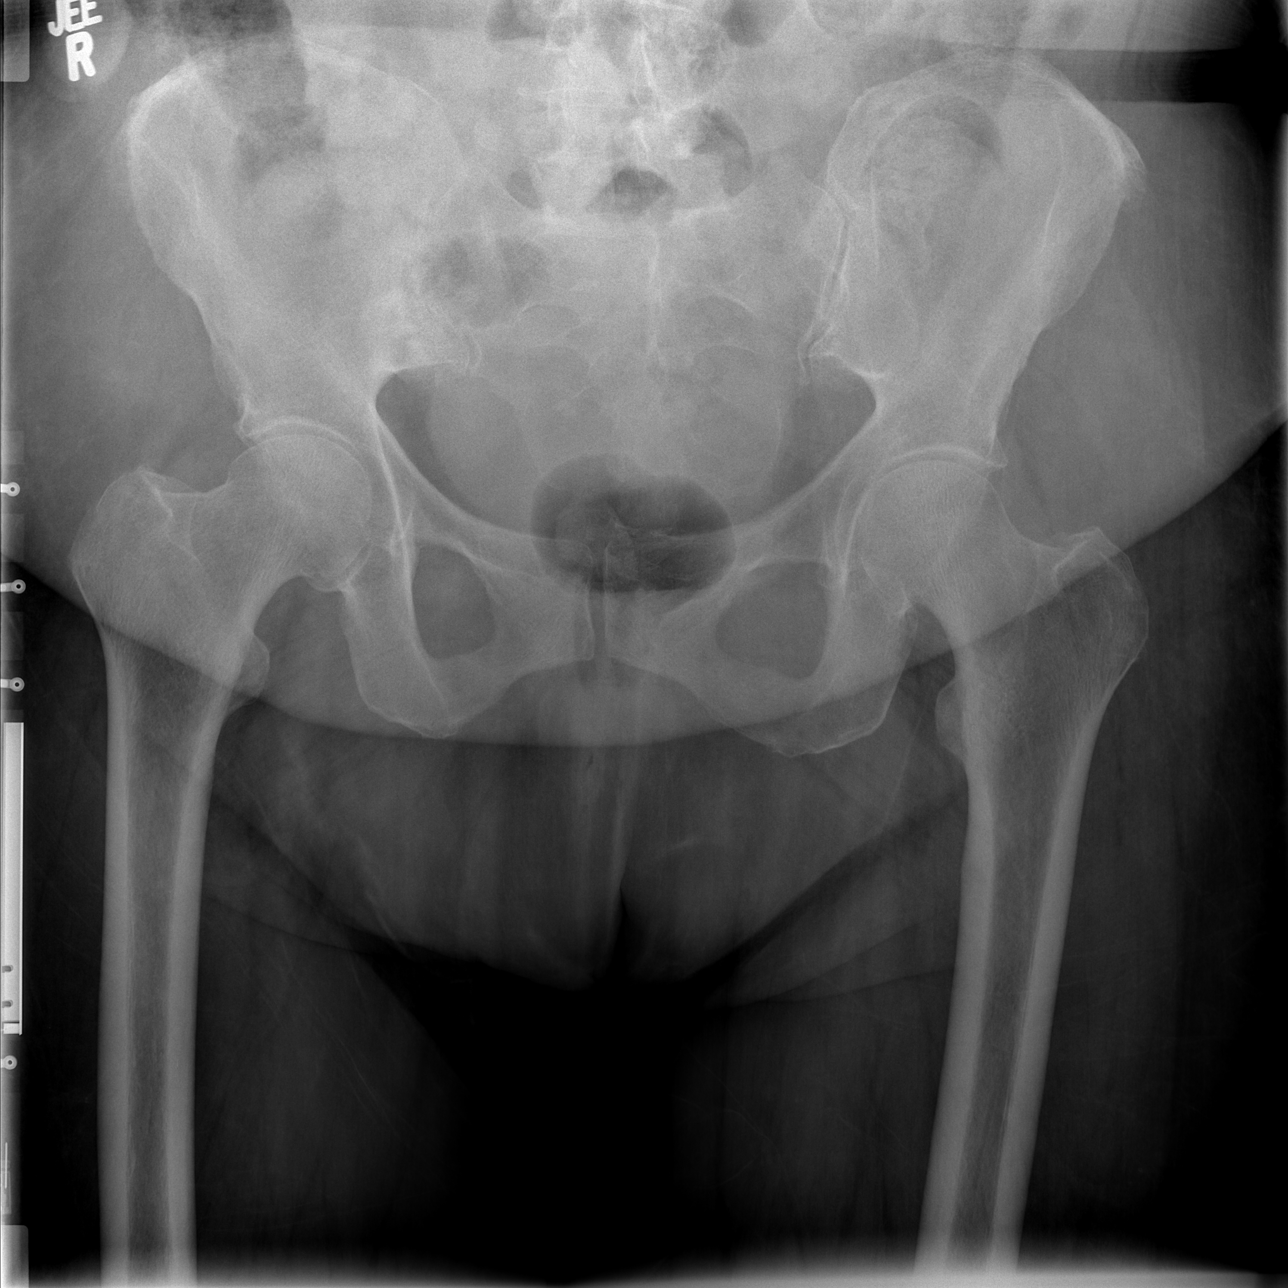

[t hip ap left]
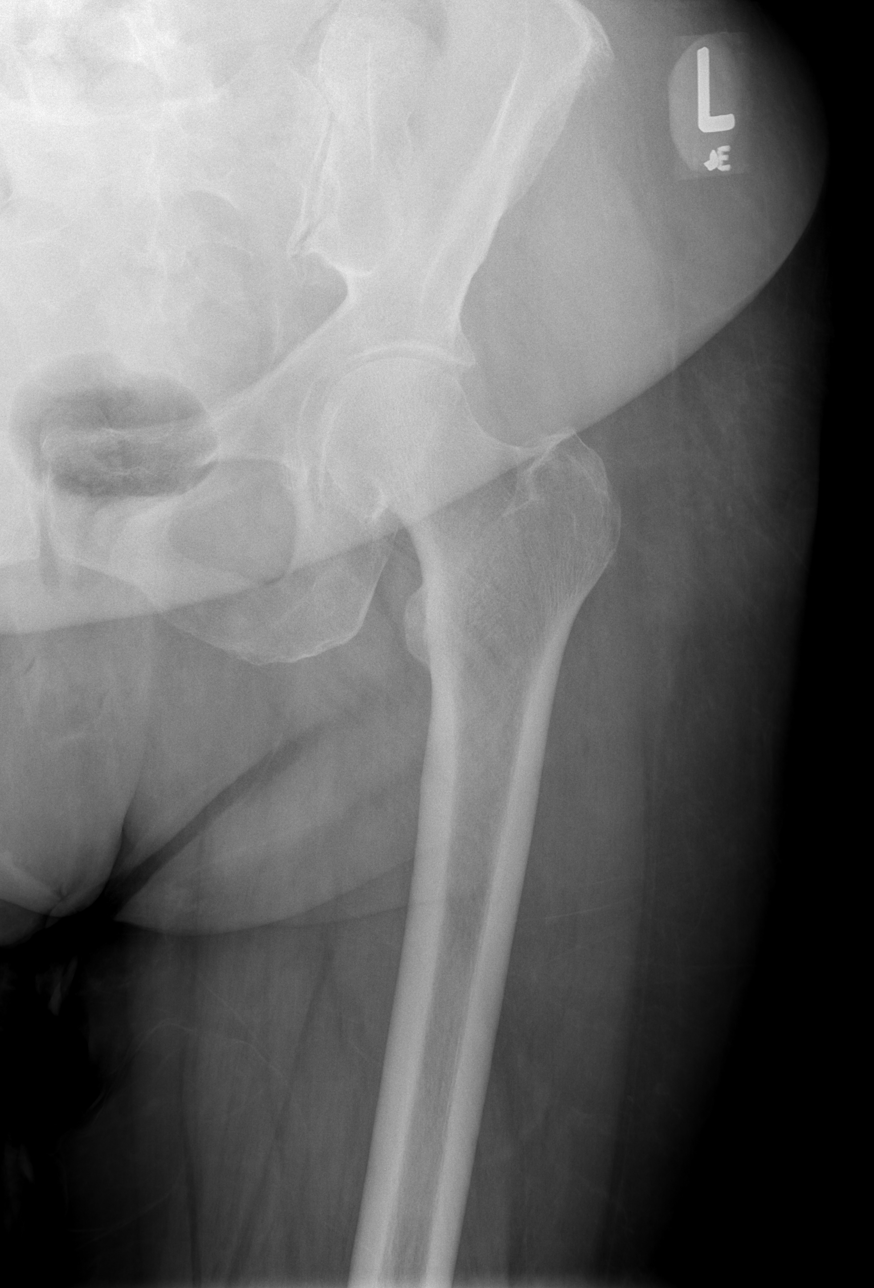

[t hip frog leg left]
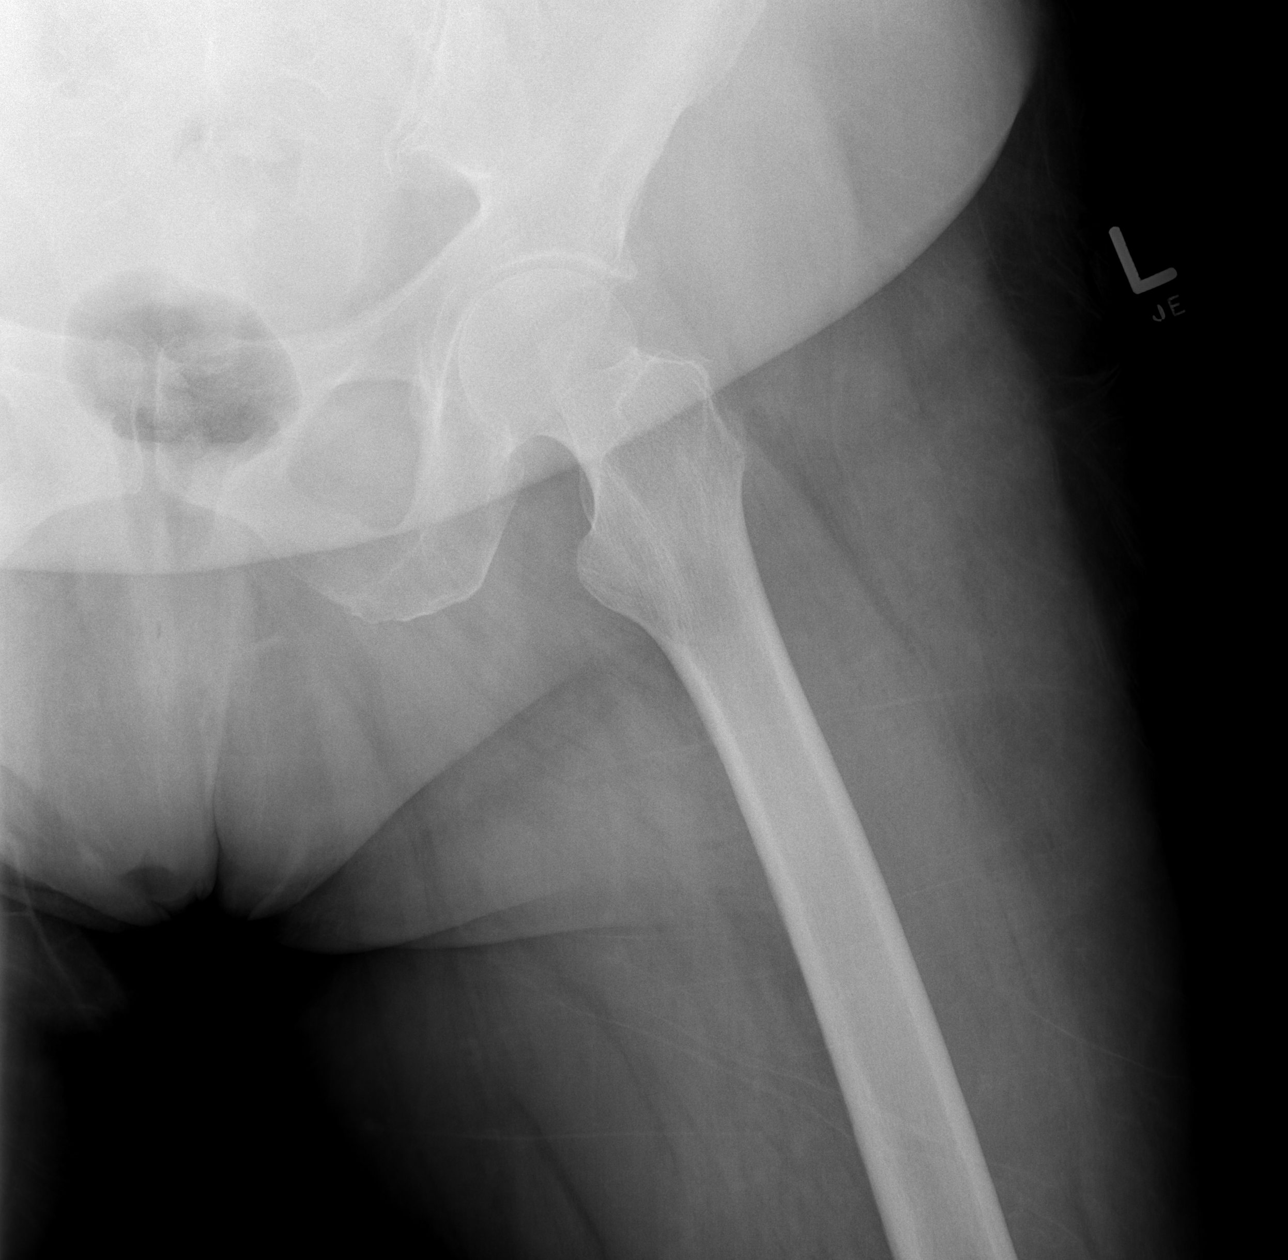

[3 of 3 positions shown; findings below may reference images not displayed]

FINDINGS: The patient's pannus projects over the proximal left
femur.  The left femoral head is located in the acetabulum.  Bony
mineralization is normal.  No acute fracture of the proximal femurs
or bony pelvis is identified.  The right sacroiliac joint appears
abnormal.  There is sclerosis and narrowing of the joint space.
IMPRESSION: 1.  No acute bony abnormality identified.
2.  Question right sacroilitis vs degenerative change of the
sacroiliac joint.

## 2010-10-25 IMAGING — MR MR PELVIS W/O CM
4 of 5 series · 20 of 48 positions shown · non-contrast
Comparison: None

CLINICAL DATA: Low back and left hip pain.

MRI PELVIS WITHOUT CONTRAST
TECHNIQUE: Multi-planar multi-sequence MR imaging of the pelvis
was performed following the standard protocol. No intravenous
contrast was administered.

[Series 4: T1 · axial · 6.0mm · 0.66mm/px · z∈[-16,+202]mm · 9 of 30 slices shown (1 of 2)]
[im 1/30]
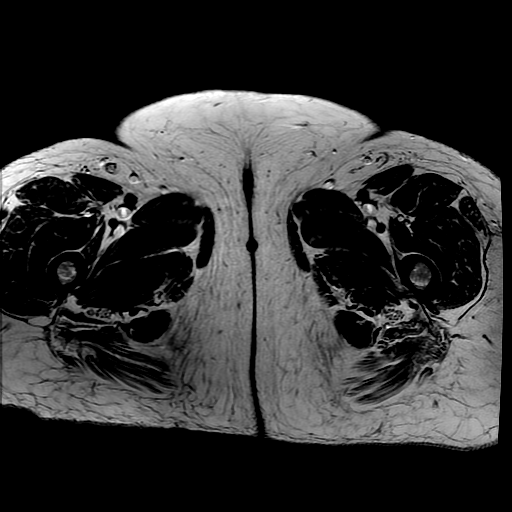
[im 4/30]
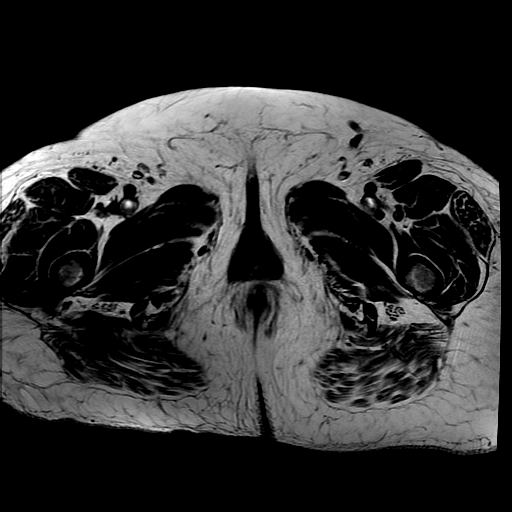
[im 8/30]
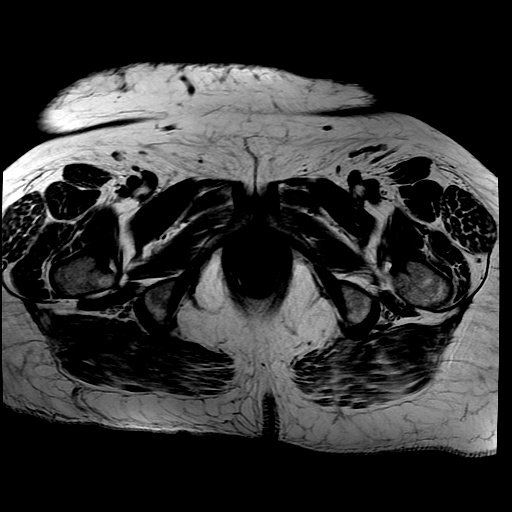
[im 11/30]
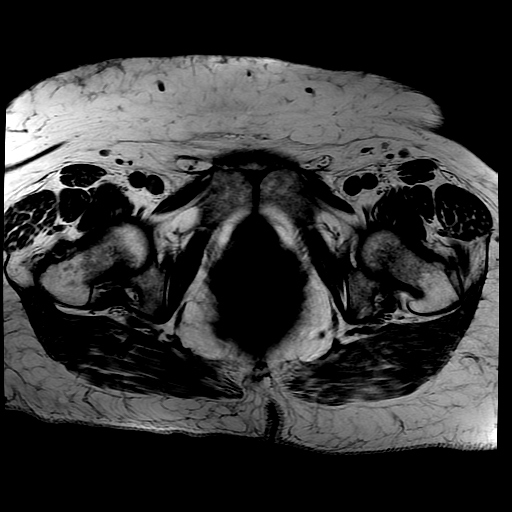
[im 15/30]
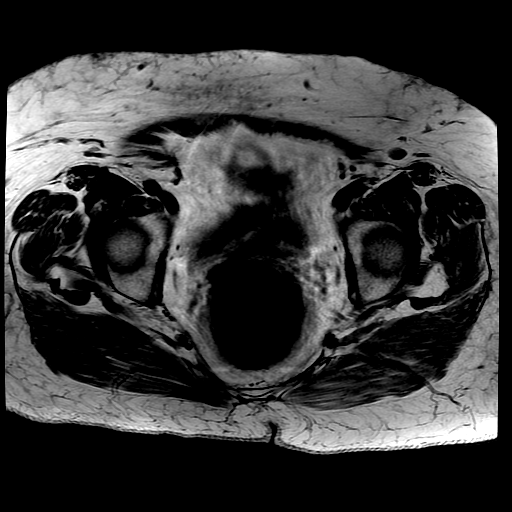
[im 19/30]
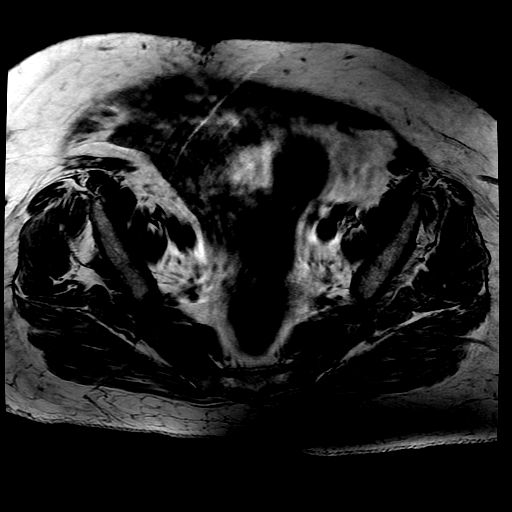
[im 22/30]
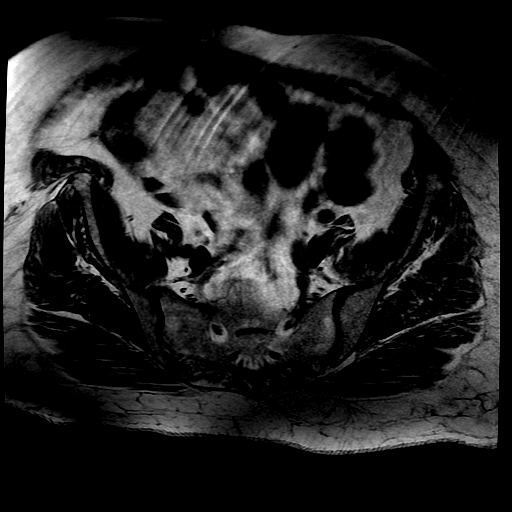
[im 26/30]
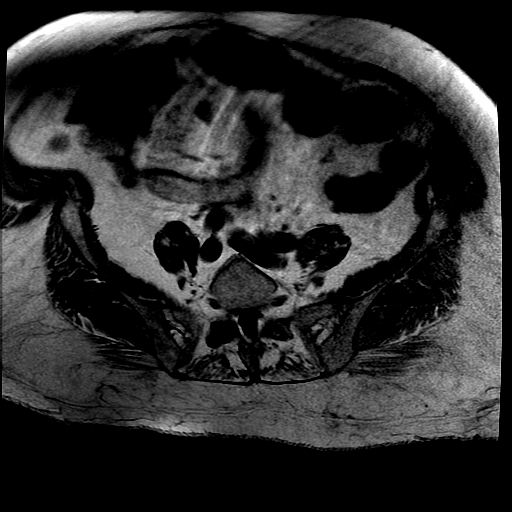
[im 30/30]
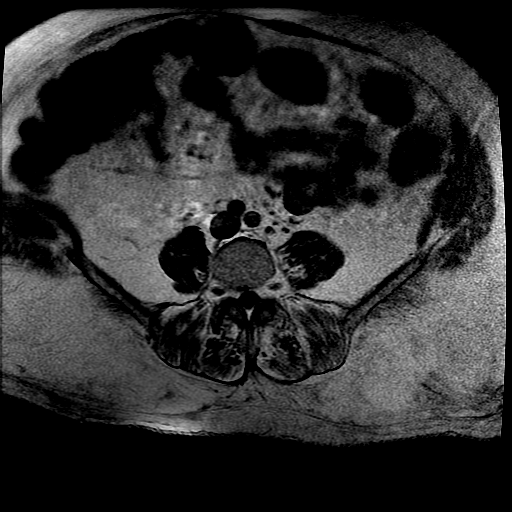

[Series 5: T2 fat-sat · axial · 6.0mm · 0.66mm/px · z∈[-16,+172]mm · 5 of 30 slices shown]
[im 1/30]
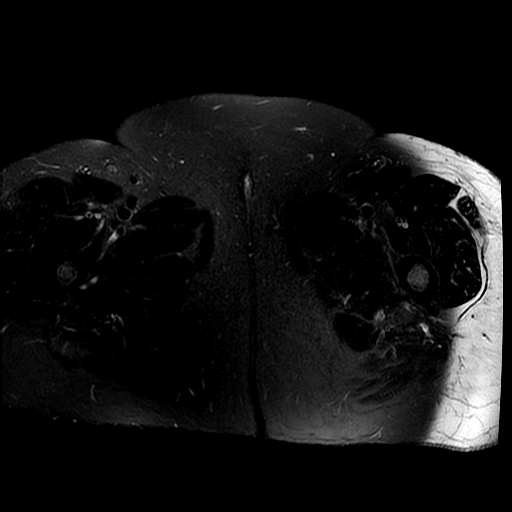
[im 4/30]
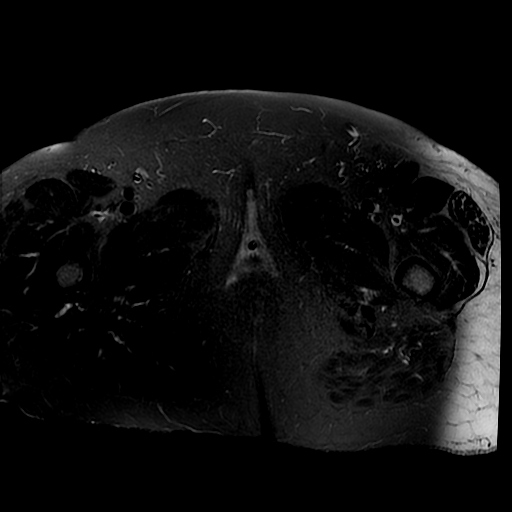
[im 8/30]
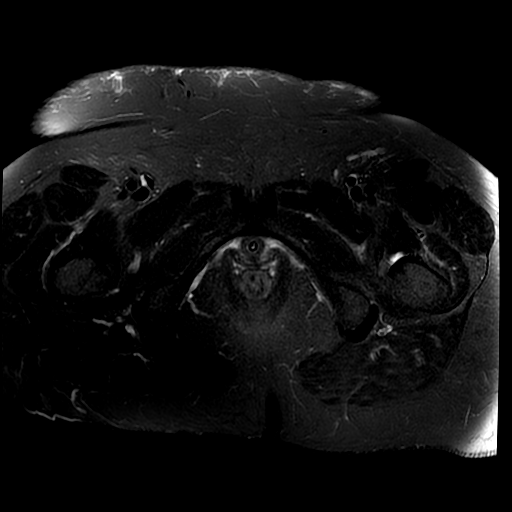
[im 15/30]
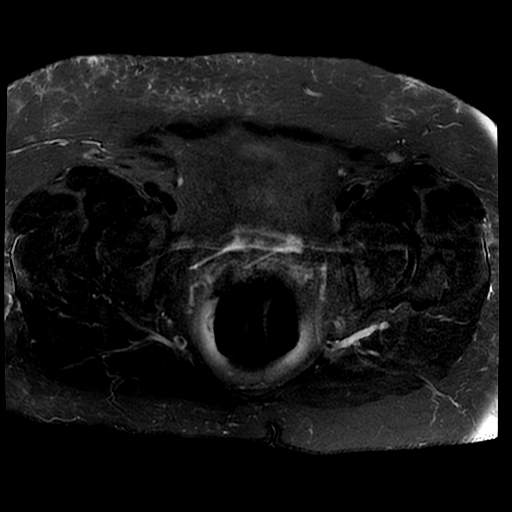
[im 26/30]
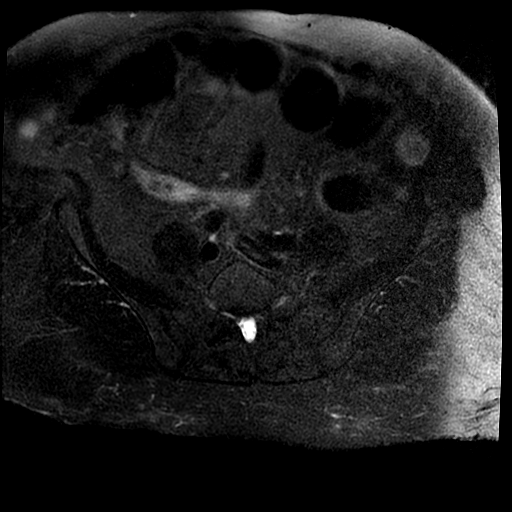

[Series 6: T1 · coronal · 6.0mm · 0.66mm/px · 3 of 27 slices shown (2 of 2)]
[im 4/27]
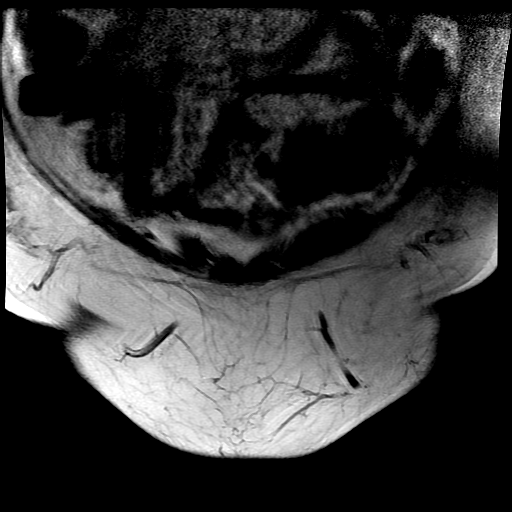
[im 14/27]
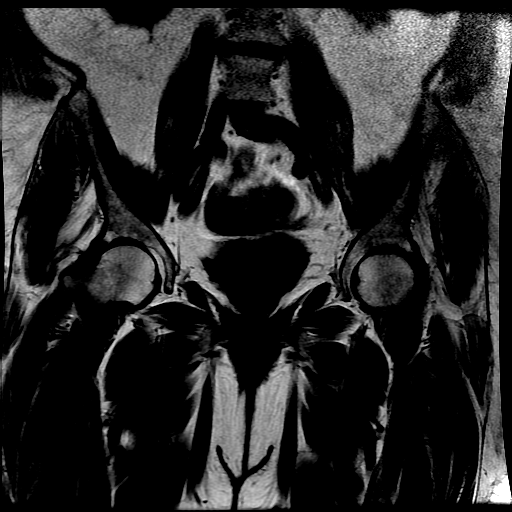
[im 23/27]
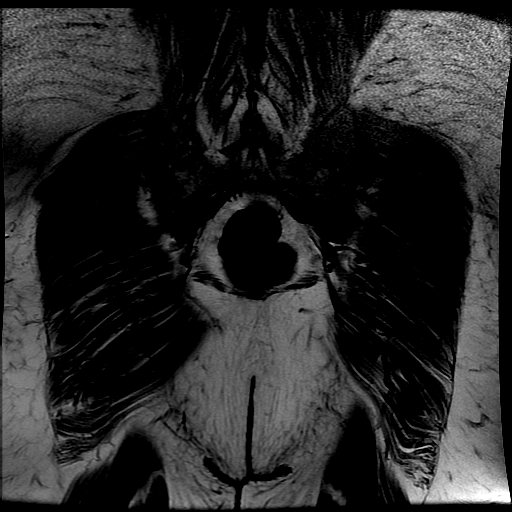

[Series 7: cor ir · coronal · 6.0mm · 0.66mm/px · 3 of 27 slices shown]
[im 4/27]
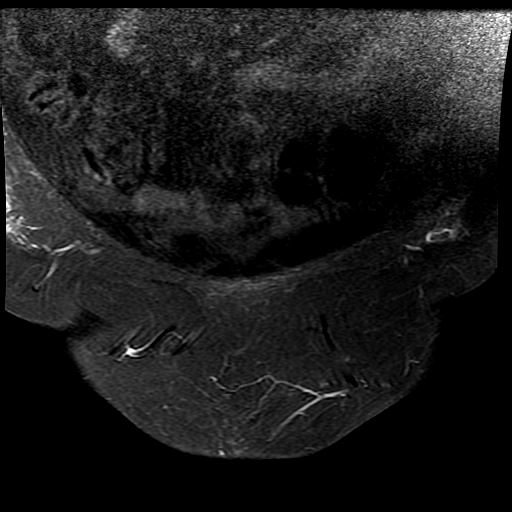
[im 14/27]
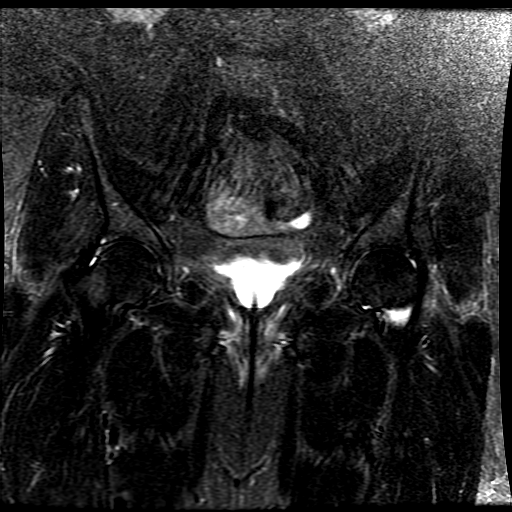
[im 23/27]
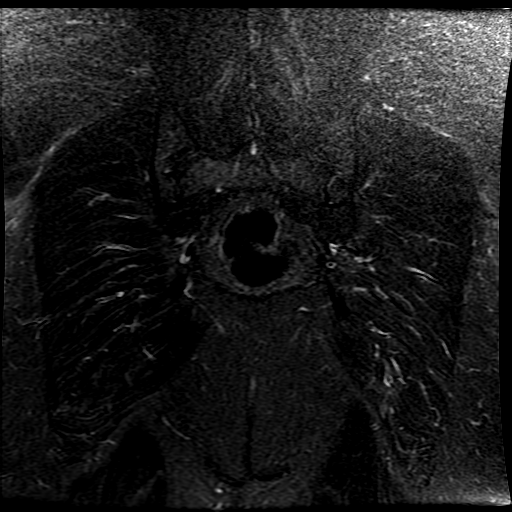

[20 of 48 positions shown; findings below may reference images not displayed]

FINDINGS: The osseous structures of the left hip are normal.  There
is a tiny left hip effusion as compared to the right.  The adjacent
soft tissues are essentially normal.

There is no adenopathy or mass lesion.  The osseous structures of
the pelvis including the sacroiliac joints appear normal.  The
uterus is atrophic.  The sacrum appears normal.
IMPRESSION: 1.  Tiny left hip effusion.
2.  Otherwise normal exam.

## 2010-10-25 IMAGING — MR MR LUMBAR SPINE W/O CM
4 of 5 series · 19 of 48 positions shown · non-contrast
Comparison: None

CLINICAL DATA: Left hip pain

MRI LUMBAR SPINE WITHOUT CONTRAST
TECHNIQUE: Multiplanar and multiecho pulse sequences of the lumbar
spine were obtained without intravenous contrast.

[Series 4: T2 · sagittal · 4.0mm · 0.55mm/px · 4 of 12 slices shown (1 of 2)]
[im 1/12]
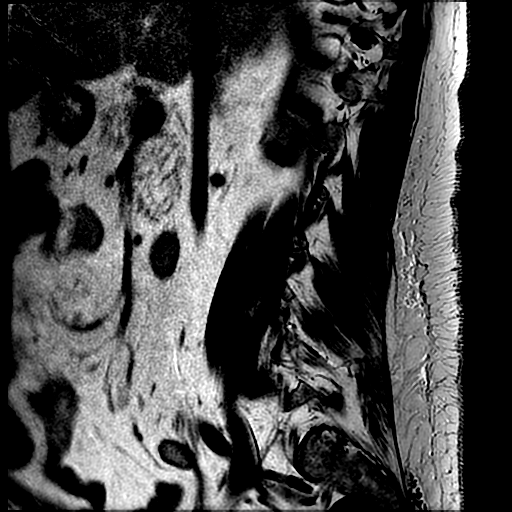
[im 4/12]
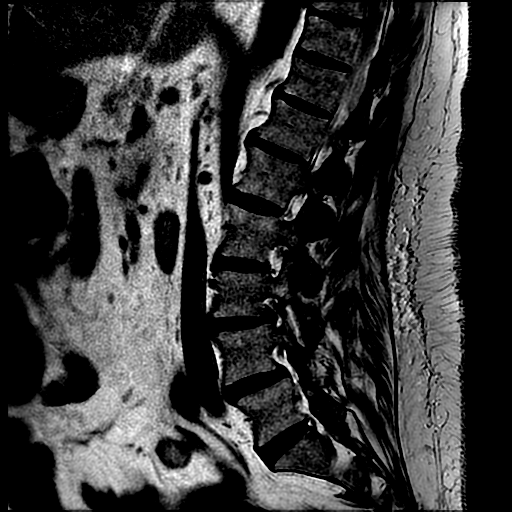
[im 8/12]
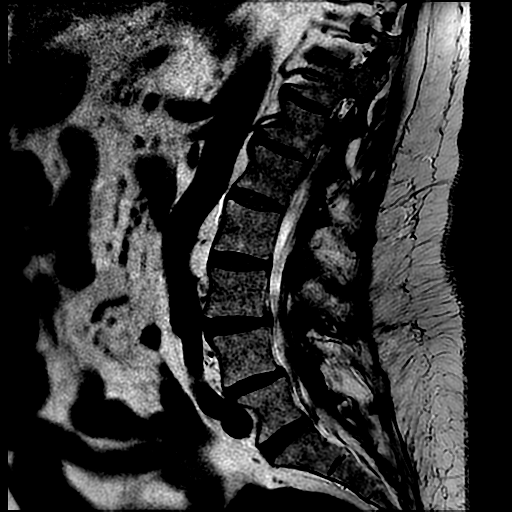
[im 12/12]
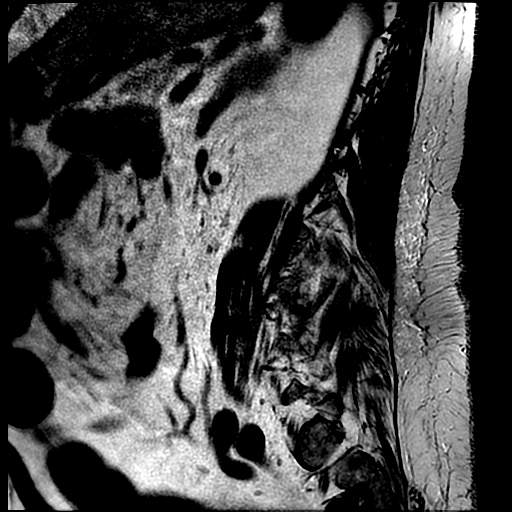

[Series 5: T1 · sagittal · 4.0mm · 0.55mm/px · 3 of 12 slices shown (1 of 2)]
[im 1/12]
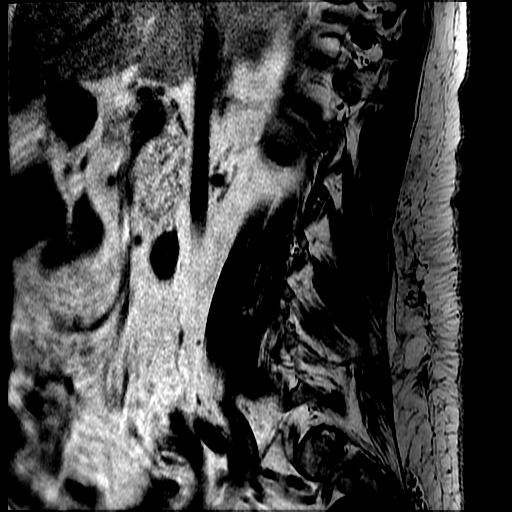
[im 6/12]
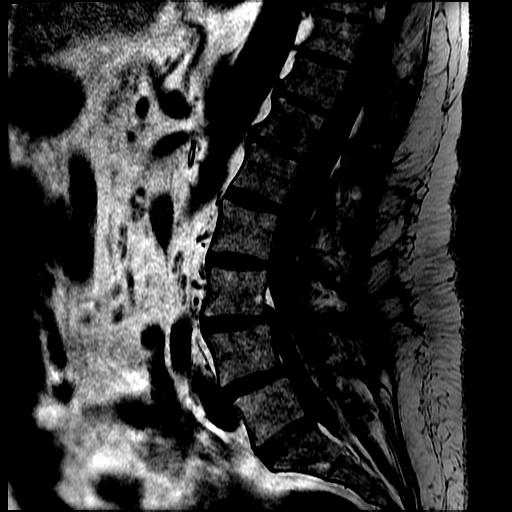
[im 12/12]
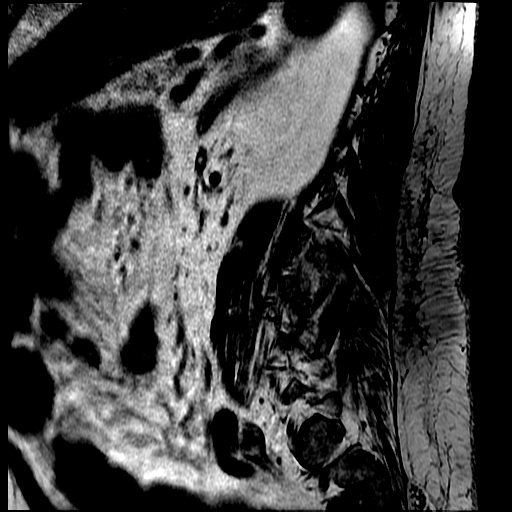

[Series 7: T2 · axial · 4.0mm · 0.39mm/px · z∈[-5,+215]mm · 9 of 40 slices shown (2 of 2)]
[im 3/40]
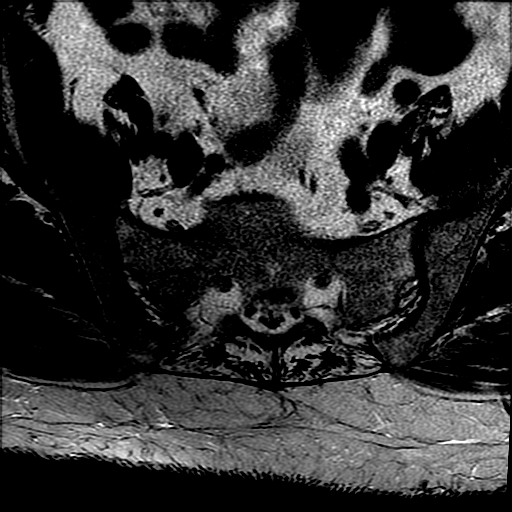
[im 5/40]
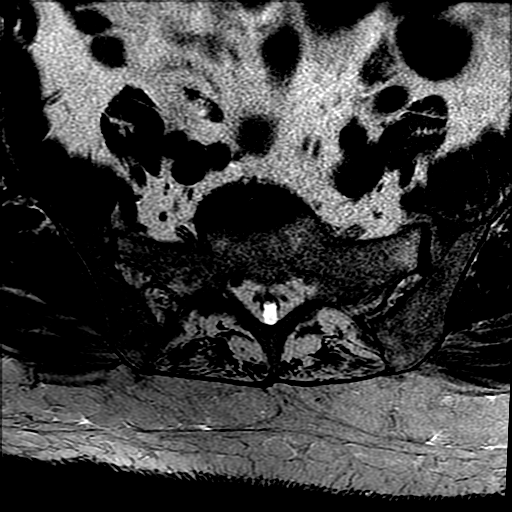
[im 8/40]
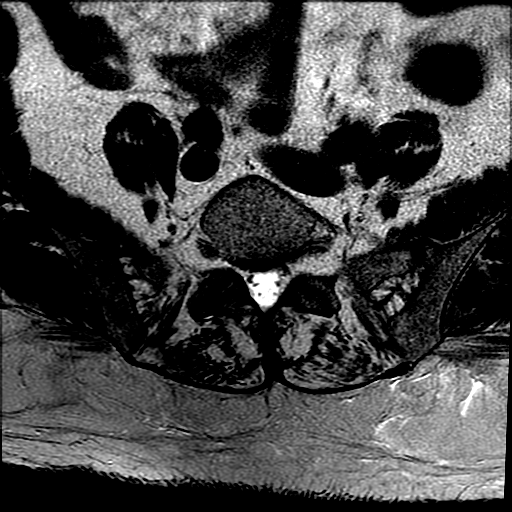
[im 13/40]
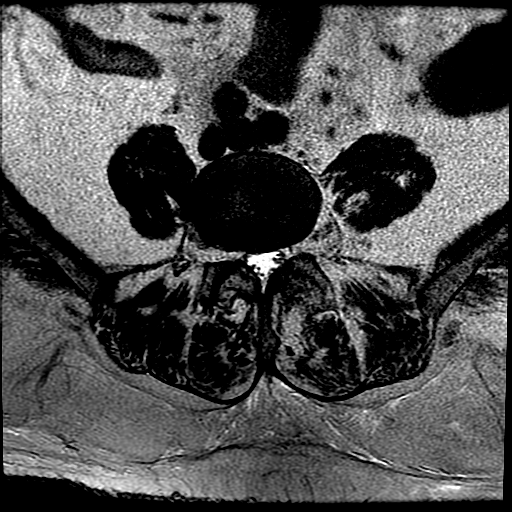
[im 18/40]
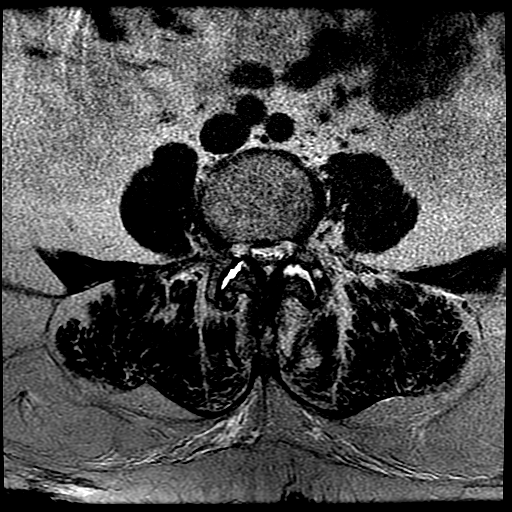
[im 20/40]
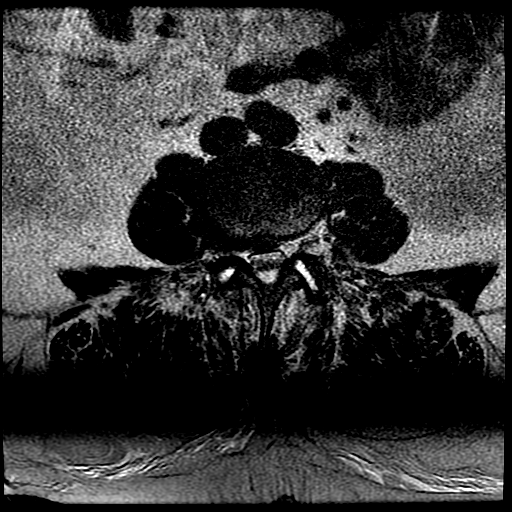
[im 22/40]
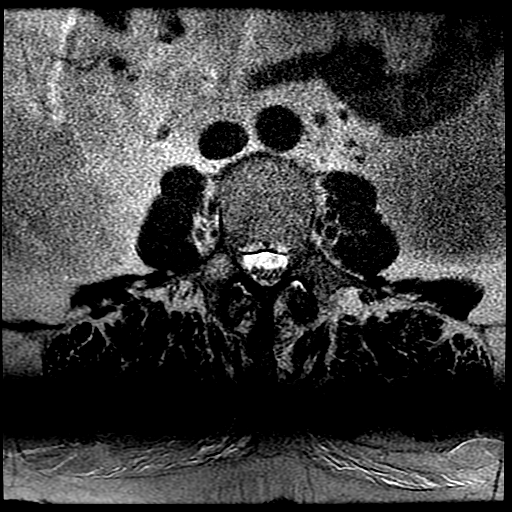
[im 27/40]
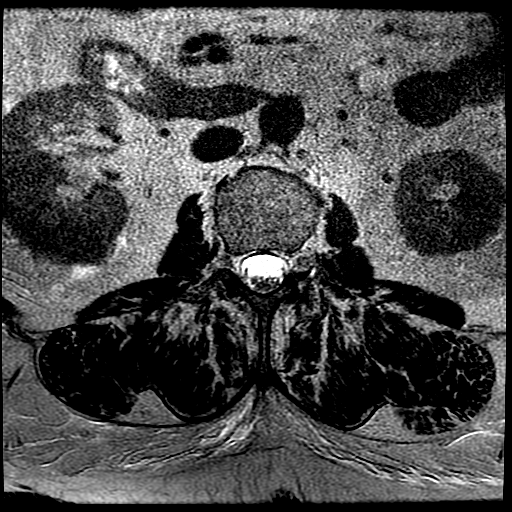
[im 35/40]
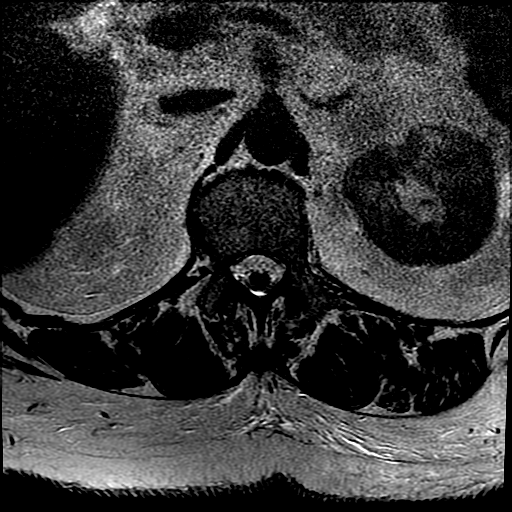

[Series 8: T1 · axial · 4.0mm · 0.39mm/px · z∈[+4,+215]mm · 3 of 40 slices shown (2 of 2)]
[im 5/40]
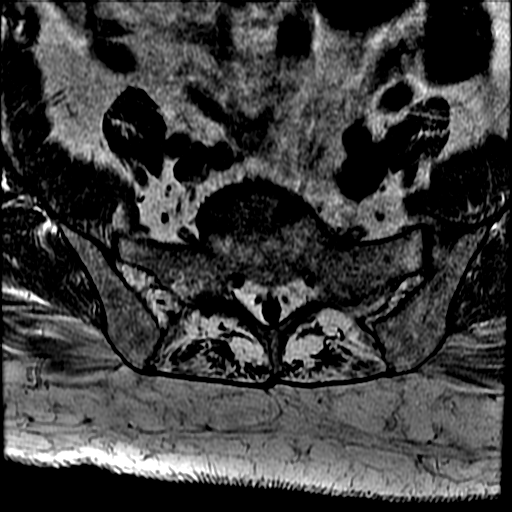
[im 20/40]
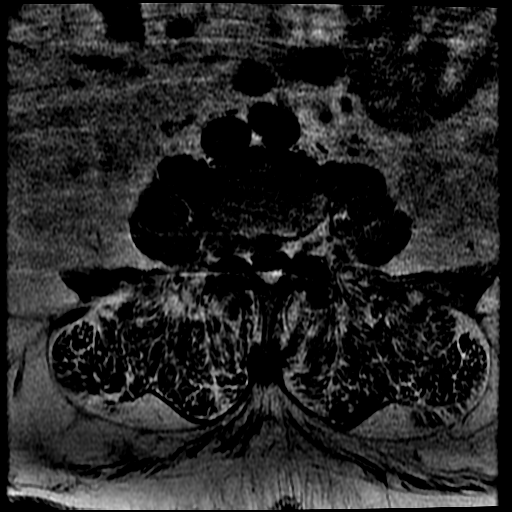
[im 35/40]
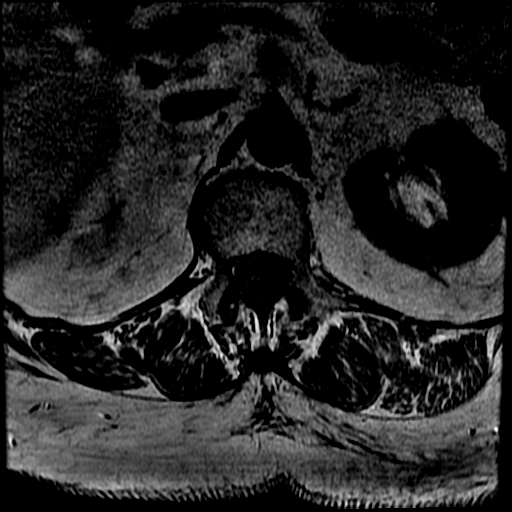

[19 of 48 positions shown; findings below may reference images not displayed]

FINDINGS: Grade 1 anterior slip of L3 on L4.  Negative for fracture
or mass lesion.  Conus medullaris is normal and terminates at L1-2.

T12-L1:  Mild disc bulging.

L1-2:  Mild facet degeneration without stenosis.

L2-3:  Disc degeneration with disc bulging.  Moderate facet
degeneration without significant spinal stenosis.

L3-4:  Grade 1 anterior slip. Severe spinal stenosis. There is
advanced facet degeneration with bilateral facet joint effusions
and overgrowth of the facet joint.  There is a right-sided disc
protrusion causing   moderate right foraminal encroachment with
possible impingement of the right L3 nerve root in the foramen.
Left foramen is adequately patent.

L4-5:  Grade 1 anterior slip is present with disc and facet
degeneration.  Mild spinal stenosis.

L5-S1:  Disc and facet degeneration without significant spinal
stenosis.  Small annular tear on the right without significant
herniation of disc material.
IMPRESSION: Severe spinal stenosis at L3-4 due to disc and facet degeneration.
In addition there is a right sided disc protrusion causing right
foraminal encroachment.

Mild spinal stenosis at L4-5 due to disc and facet degeneration.

## 2010-10-26 LAB — GLUCOSE, RANDOM: Glucose, Bld: 421 mg/dL — ABNORMAL HIGH (ref 70–99)

## 2010-10-26 LAB — URINALYSIS, ROUTINE W REFLEX MICROSCOPIC
Bilirubin Urine: NEGATIVE
Glucose, UA: 250 mg/dL — AB
Hgb urine dipstick: NEGATIVE
Ketones, ur: NEGATIVE mg/dL
Nitrite: POSITIVE — AB
Protein, ur: NEGATIVE mg/dL
Specific Gravity, Urine: 1.011 (ref 1.005–1.030)
Urobilinogen, UA: 0.2 mg/dL (ref 0.0–1.0)
pH: 6 (ref 5.0–8.0)

## 2010-10-26 LAB — BASIC METABOLIC PANEL WITH GFR
BUN: 17 mg/dL (ref 6–23)
CO2: 27 meq/L (ref 19–32)
Calcium: 8.6 mg/dL (ref 8.4–10.5)
Chloride: 99 meq/L (ref 96–112)
Creatinine, Ser: 1.13 mg/dL (ref 0.4–1.2)
GFR calc non Af Amer: 49 mL/min — ABNORMAL LOW
Glucose, Bld: 461 mg/dL — ABNORMAL HIGH (ref 70–99)
Potassium: 3.1 meq/L — ABNORMAL LOW (ref 3.5–5.1)
Sodium: 136 meq/L (ref 135–145)

## 2010-10-26 LAB — POCT I-STAT, CHEM 8
BUN: 27 mg/dL — ABNORMAL HIGH (ref 6–23)
Calcium, Ion: 1.08 mmol/L — ABNORMAL LOW (ref 1.12–1.32)
Chloride: 95 meq/L — ABNORMAL LOW (ref 96–112)
Creatinine, Ser: 1.1 mg/dL (ref 0.4–1.2)
Glucose, Bld: 364 mg/dL — ABNORMAL HIGH (ref 70–99)
HCT: 40 % (ref 36.0–46.0)
Hemoglobin: 13.6 g/dL (ref 12.0–15.0)
Potassium: 3.1 meq/L — ABNORMAL LOW (ref 3.5–5.1)
Sodium: 134 meq/L — ABNORMAL LOW (ref 135–145)
TCO2: 32 mmol/L (ref 0–100)

## 2010-10-26 LAB — COMPREHENSIVE METABOLIC PANEL WITH GFR
ALT: 40 U/L — ABNORMAL HIGH (ref 0–35)
ALT: 39 U/L — ABNORMAL HIGH (ref 0–35)
AST: 43 U/L — ABNORMAL HIGH (ref 0–37)
AST: 29 U/L (ref 0–37)
Albumin: 3 g/dL — ABNORMAL LOW (ref 3.5–5.2)
Albumin: 3.5 g/dL (ref 3.5–5.2)
Alkaline Phosphatase: 104 U/L (ref 39–117)
Alkaline Phosphatase: 122 U/L — ABNORMAL HIGH (ref 39–117)
BUN: 15 mg/dL (ref 6–23)
BUN: 23 mg/dL (ref 6–23)
CO2: 31 meq/L (ref 19–32)
CO2: 29 meq/L (ref 19–32)
Calcium: 8.6 mg/dL (ref 8.4–10.5)
Calcium: 9.2 mg/dL (ref 8.4–10.5)
Chloride: 103 meq/L (ref 96–112)
Chloride: 95 meq/L — ABNORMAL LOW (ref 96–112)
Creatinine, Ser: 0.96 mg/dL (ref 0.4–1.2)
Creatinine, Ser: 1.22 mg/dL — ABNORMAL HIGH (ref 0.4–1.2)
GFR calc non Af Amer: 60 mL/min — ABNORMAL LOW
GFR calc non Af Amer: 45 mL/min — ABNORMAL LOW
Glucose, Bld: 174 mg/dL — ABNORMAL HIGH (ref 70–99)
Glucose, Bld: 369 mg/dL — ABNORMAL HIGH (ref 70–99)
Potassium: 3.5 meq/L (ref 3.5–5.1)
Potassium: 3 meq/L — ABNORMAL LOW (ref 3.5–5.1)
Sodium: 141 meq/L (ref 135–145)
Sodium: 131 meq/L — ABNORMAL LOW (ref 135–145)
Total Bilirubin: 0.8 mg/dL (ref 0.3–1.2)
Total Bilirubin: 0.7 mg/dL (ref 0.3–1.2)
Total Protein: 5.9 g/dL — ABNORMAL LOW (ref 6.0–8.3)
Total Protein: 6.4 g/dL (ref 6.0–8.3)

## 2010-10-26 LAB — CBC
HCT: 38.4 % (ref 36.0–46.0)
HCT: 41.4 % (ref 36.0–46.0)
HCT: 38.3 % (ref 36.0–46.0)
Hemoglobin: 13.4 g/dL (ref 12.0–15.0)
Hemoglobin: 14.1 g/dL (ref 12.0–15.0)
Hemoglobin: 13.5 g/dL (ref 12.0–15.0)
MCH: 34.9 pg — ABNORMAL HIGH (ref 26.0–34.0)
MCH: 35.9 pg — ABNORMAL HIGH (ref 26.0–34.0)
MCH: 35.5 pg — ABNORMAL HIGH (ref 26.0–34.0)
MCHC: 34.1 g/dL (ref 30.0–36.0)
MCHC: 34.9 g/dL (ref 30.0–36.0)
MCHC: 35.2 g/dL (ref 30.0–36.0)
MCV: 102.5 fL — ABNORMAL HIGH (ref 78.0–100.0)
MCV: 102.9 fL — ABNORMAL HIGH (ref 78.0–100.0)
MCV: 100.8 fL — ABNORMAL HIGH (ref 78.0–100.0)
Platelets: 154 10*3/uL (ref 150–400)
Platelets: 169 K/uL (ref 150–400)
Platelets: 184 K/uL (ref 150–400)
RBC: 3.73 MIL/uL — ABNORMAL LOW (ref 3.87–5.11)
RBC: 4.04 MIL/uL (ref 3.87–5.11)
RBC: 3.8 MIL/uL — ABNORMAL LOW (ref 3.87–5.11)
RDW: 14.5 % (ref 11.5–15.5)
RDW: 14.8 % (ref 11.5–15.5)
RDW: 14.3 % (ref 11.5–15.5)
WBC: 8.1 10*3/uL (ref 4.0–10.5)
WBC: 9.9 K/uL (ref 4.0–10.5)
WBC: 10.1 K/uL (ref 4.0–10.5)

## 2010-10-26 LAB — POCT CARDIAC MARKERS
CKMB, poc: 1.6 ng/mL (ref 1.0–8.0)
CKMB, poc: 2.6 ng/mL (ref 1.0–8.0)
Myoglobin, poc: 123 ng/mL (ref 12–200)
Myoglobin, poc: 174 ng/mL (ref 12–200)
Troponin i, poc: <0.05 ng/mL (ref 0.00–0.09)
Troponin i, poc: <0.05 ng/mL (ref 0.00–0.09)

## 2010-10-26 LAB — CARDIAC PANEL(CRET KIN+CKTOT+MB+TROPI)
CK, MB: 15 ng/mL (ref 0.3–4.0)
CK, MB: 15.9 ng/mL (ref 0.3–4.0)
CK, MB: 18.9 ng/mL (ref 0.3–4.0)
CK, MB: 6.6 ng/mL (ref 0.3–4.0)
Relative Index: 0.6 (ref 0.0–2.5)
Relative Index: 0.7 (ref 0.0–2.5)
Relative Index: 1 (ref 0.0–2.5)
Relative Index: 1 (ref 0.0–2.5)
Total CK: 1114 U/L — ABNORMAL HIGH (ref 7–177)
Total CK: 1467 U/L — ABNORMAL HIGH (ref 7–177)
Total CK: 1928 U/L — ABNORMAL HIGH (ref 7–177)
Total CK: 2164 U/L — ABNORMAL HIGH (ref 7–177)
Troponin I: 0.02 ng/mL (ref 0.00–0.06)

## 2010-10-26 LAB — URINE MICROSCOPIC-ADD ON

## 2010-10-26 LAB — URINE CULTURE: Culture  Setup Time: 201112150841

## 2010-10-26 LAB — DIFFERENTIAL
Basophils Absolute: 0.1 10*3/uL (ref 0.0–0.1)
Basophils Relative: 1 % (ref 0–1)
Eosinophils Absolute: 0.1 10*3/uL (ref 0.0–0.7)
Eosinophils Relative: 1 % (ref 0–5)
Lymphocytes Relative: 11 % — ABNORMAL LOW (ref 12–46)
Lymphs Abs: 1.1 10*3/uL (ref 0.7–4.0)
Monocytes Absolute: 1.2 10*3/uL — ABNORMAL HIGH (ref 0.1–1.0)
Monocytes Relative: 12 % (ref 3–12)
Neutro Abs: 7.6 10*3/uL (ref 1.7–7.7)
Neutrophils Relative %: 75 % (ref 43–77)

## 2010-10-26 LAB — BASIC METABOLIC PANEL
BUN: 21 mg/dL (ref 6–23)
CO2: 30 mEq/L (ref 19–32)
Calcium: 9.2 mg/dL (ref 8.4–10.5)
Chloride: 99 mEq/L (ref 96–112)
Creatinine, Ser: 1.11 mg/dL (ref 0.4–1.2)
GFR calc Af Amer: >60 mL/min (ref >60)
GFR calc non Af Amer: 50 mL/min — ABNORMAL LOW (ref >60)
Glucose, Bld: 369 mg/dL — ABNORMAL HIGH (ref 70–99)
Potassium: 4.2 mEq/L (ref 3.5–5.1)
Sodium: 137 mEq/L (ref 135–145)

## 2010-10-26 LAB — GLUCOSE, CAPILLARY
Glucose-Capillary: 169 mg/dL — ABNORMAL HIGH (ref 70–99)
Glucose-Capillary: 283 mg/dL — ABNORMAL HIGH (ref 70–99)
Glucose-Capillary: 319 mg/dL — ABNORMAL HIGH (ref 70–99)
Glucose-Capillary: 346 mg/dL — ABNORMAL HIGH (ref 70–99)
Glucose-Capillary: 348 mg/dL — ABNORMAL HIGH (ref 70–99)
Glucose-Capillary: 377 mg/dL — ABNORMAL HIGH (ref 70–99)
Glucose-Capillary: 404 mg/dL — ABNORMAL HIGH (ref 70–99)
Glucose-Capillary: 435 mg/dL — ABNORMAL HIGH (ref 70–99)
Glucose-Capillary: 449 mg/dL — ABNORMAL HIGH (ref 70–99)

## 2010-10-26 LAB — CK TOTAL AND CKMB (NOT AT ARMC)
CK, MB: 2.6 ng/mL (ref 0.3–4.0)
Relative Index: INVALID (ref 0.0–2.5)
Total CK: 64 U/L (ref 7–177)

## 2010-10-26 LAB — TROPONIN I: Troponin I: 0.01 ng/mL (ref 0.00–0.06)

## 2010-10-26 LAB — PROTIME-INR
INR: 2.38 — ABNORMAL HIGH (ref 0.00–1.49)
INR: 2.56 — ABNORMAL HIGH (ref 0.00–1.49)
INR: 2.69 — ABNORMAL HIGH (ref 0.00–1.49)
Prothrombin Time: 26.1 s — ABNORMAL HIGH (ref 11.6–15.2)
Prothrombin Time: 27.6 seconds — ABNORMAL HIGH (ref 11.6–15.2)
Prothrombin Time: 28.7 seconds — ABNORMAL HIGH (ref 11.6–15.2)

## 2010-10-26 LAB — APTT

## 2010-10-26 LAB — BRAIN NATRIURETIC PEPTIDE: Pro B Natriuretic peptide (BNP): 41 pg/mL (ref 0.0–100.0)

## 2010-10-29 LAB — URINALYSIS, ROUTINE W REFLEX MICROSCOPIC
Bilirubin Urine: NEGATIVE
Glucose, UA: 250 mg/dL — AB
Hgb urine dipstick: NEGATIVE
Ketones, ur: NEGATIVE mg/dL
Nitrite: NEGATIVE
Protein, ur: NEGATIVE mg/dL
Specific Gravity, Urine: 1.011 (ref 1.005–1.030)
Urobilinogen, UA: 0.2 mg/dL (ref 0.0–1.0)
pH: 7 (ref 5.0–8.0)

## 2010-10-29 LAB — POCT I-STAT, CHEM 8
BUN: 31 mg/dL — ABNORMAL HIGH (ref 6–23)
Calcium, Ion: 1.03 mmol/L — ABNORMAL LOW (ref 1.12–1.32)
Chloride: 98 mEq/L (ref 96–112)
Creatinine, Ser: 1.4 mg/dL — ABNORMAL HIGH (ref 0.4–1.2)
Glucose, Bld: 379 mg/dL — ABNORMAL HIGH (ref 70–99)
HCT: 44 % (ref 36.0–46.0)
Hemoglobin: 15 g/dL (ref 12.0–15.0)
Potassium: 3.4 mEq/L — ABNORMAL LOW (ref 3.5–5.1)
Sodium: 135 mEq/L (ref 135–145)
TCO2: 33 mmol/L (ref 0–100)

## 2010-10-29 LAB — GLUCOSE, CAPILLARY
Glucose-Capillary: 182 mg/dL — ABNORMAL HIGH (ref 70–99)
Glucose-Capillary: 255 mg/dL — ABNORMAL HIGH (ref 70–99)
Glucose-Capillary: 258 mg/dL — ABNORMAL HIGH (ref 70–99)
Glucose-Capillary: 292 mg/dL — ABNORMAL HIGH (ref 70–99)
Glucose-Capillary: 311 mg/dL — ABNORMAL HIGH (ref 70–99)
Glucose-Capillary: 319 mg/dL — ABNORMAL HIGH (ref 70–99)
Glucose-Capillary: 381 mg/dL — ABNORMAL HIGH (ref 70–99)
Glucose-Capillary: 463 mg/dL — ABNORMAL HIGH (ref 70–99)

## 2010-10-29 LAB — PROTIME-INR
INR: 1.53 — ABNORMAL HIGH (ref 0.00–1.49)
INR: 1.75 — ABNORMAL HIGH (ref 0.00–1.49)
INR: 2.25 — ABNORMAL HIGH (ref 0.00–1.49)
Prothrombin Time: 18.6 seconds — ABNORMAL HIGH (ref 11.6–15.2)
Prothrombin Time: 20.6 seconds — ABNORMAL HIGH (ref 11.6–15.2)
Prothrombin Time: 25 seconds — ABNORMAL HIGH (ref 11.6–15.2)

## 2010-10-29 LAB — BASIC METABOLIC PANEL
BUN: 18 mg/dL (ref 6–23)
BUN: 23 mg/dL (ref 6–23)
CO2: 30 mEq/L (ref 19–32)
CO2: 30 mEq/L (ref 19–32)
Calcium: 8.9 mg/dL (ref 8.4–10.5)
Calcium: 9.4 mg/dL (ref 8.4–10.5)
Chloride: 103 mEq/L (ref 96–112)
Chloride: 104 mEq/L (ref 96–112)
Creatinine, Ser: 1.05 mg/dL (ref 0.4–1.2)
Creatinine, Ser: 1.1 mg/dL (ref 0.4–1.2)
GFR calc Af Amer: >60 mL/min (ref >60)
GFR calc Af Amer: >60 mL/min (ref >60)
GFR calc non Af Amer: 51 mL/min — ABNORMAL LOW (ref >60)
GFR calc non Af Amer: 54 mL/min — ABNORMAL LOW (ref >60)
Glucose, Bld: 282 mg/dL — ABNORMAL HIGH (ref 70–99)
Glucose, Bld: 332 mg/dL — ABNORMAL HIGH (ref 70–99)
Potassium: 3.6 mEq/L (ref 3.5–5.1)
Potassium: 4.5 mEq/L (ref 3.5–5.1)
Sodium: 138 mEq/L (ref 135–145)
Sodium: 143 mEq/L (ref 135–145)

## 2010-10-29 LAB — CBC
HCT: 39.7 % (ref 36.0–46.0)
HCT: 40.6 % (ref 36.0–46.0)
HCT: 41.5 % (ref 36.0–46.0)
Hemoglobin: 13.6 g/dL (ref 12.0–15.0)
Hemoglobin: 14.1 g/dL (ref 12.0–15.0)
Hemoglobin: 14.4 g/dL (ref 12.0–15.0)
MCH: 35.8 pg — ABNORMAL HIGH (ref 26.0–34.0)
MCH: 36.2 pg — ABNORMAL HIGH (ref 26.0–34.0)
MCH: 36.5 pg — ABNORMAL HIGH (ref 26.0–34.0)
MCHC: 34.3 g/dL (ref 30.0–36.0)
MCHC: 34.7 g/dL (ref 30.0–36.0)
MCHC: 34.7 g/dL (ref 30.0–36.0)
MCV: 103.2 fL — ABNORMAL HIGH (ref 78.0–100.0)
MCV: 104.1 fL — ABNORMAL HIGH (ref 78.0–100.0)
MCV: 106.4 fL — ABNORMAL HIGH (ref 78.0–100.0)
Platelets: 148 10*3/uL — ABNORMAL LOW (ref 150–400)
Platelets: 159 10*3/uL (ref 150–400)
Platelets: 173 10*3/uL (ref 150–400)
RBC: 3.73 MIL/uL — ABNORMAL LOW (ref 3.87–5.11)
RBC: 3.9 MIL/uL (ref 3.87–5.11)
RBC: 4.02 MIL/uL (ref 3.87–5.11)
RDW: 13.9 % (ref 11.5–15.5)
RDW: 13.9 % (ref 11.5–15.5)
RDW: 14 % (ref 11.5–15.5)
WBC: 12 10*3/uL — ABNORMAL HIGH (ref 4.0–10.5)
WBC: 12.4 10*3/uL — ABNORMAL HIGH (ref 4.0–10.5)
WBC: 7.8 10*3/uL (ref 4.0–10.5)

## 2010-10-29 LAB — DIFFERENTIAL
Basophils Absolute: 0 10*3/uL (ref 0.0–0.1)
Basophils Relative: 0 % (ref 0–1)
Eosinophils Absolute: 0.1 10*3/uL (ref 0.0–0.7)
Eosinophils Relative: 0 % (ref 0–5)
Lymphocytes Relative: 7 % — ABNORMAL LOW (ref 12–46)
Lymphs Abs: 0.9 10*3/uL (ref 0.7–4.0)
Monocytes Absolute: 0.5 10*3/uL (ref 0.1–1.0)
Monocytes Relative: 4 % (ref 3–12)
Neutro Abs: 10.9 10*3/uL — ABNORMAL HIGH (ref 1.7–7.7)
Neutrophils Relative %: 88 % — ABNORMAL HIGH (ref 43–77)

## 2010-10-29 LAB — URINE CULTURE
Colony Count: NO GROWTH
Culture  Setup Time: 201109270029
Culture: NO GROWTH

## 2010-10-29 LAB — APTT: aPTT: 40 seconds — ABNORMAL HIGH (ref 24–37)

## 2010-11-04 LAB — BASIC METABOLIC PANEL
BUN: 11 mg/dL (ref 6–23)
CO2: 31 mEq/L (ref 19–32)
Calcium: 8.9 mg/dL (ref 8.4–10.5)
Chloride: 101 mEq/L (ref 96–112)
Creatinine, Ser: 0.77 mg/dL (ref 0.4–1.2)
GFR calc Af Amer: >60 mL/min (ref >60)
GFR calc non Af Amer: >60 mL/min (ref >60)
Glucose, Bld: 229 mg/dL — ABNORMAL HIGH (ref 70–99)
Potassium: 2.3 mEq/L — CL (ref 3.5–5.1)
Sodium: 141 mEq/L (ref 135–145)

## 2010-11-04 LAB — PROTIME-INR
INR: 1.35 (ref 0.00–1.49)
INR: 1.42 (ref 0.00–1.49)
INR: 1.59 — ABNORMAL HIGH (ref 0.00–1.49)
Prothrombin Time: 16.6 seconds — ABNORMAL HIGH (ref 11.6–15.2)
Prothrombin Time: 17.2 s — ABNORMAL HIGH (ref 11.6–15.2)
Prothrombin Time: 18.8 s — ABNORMAL HIGH (ref 11.6–15.2)

## 2010-11-04 LAB — GLUCOSE, CAPILLARY
Glucose-Capillary: 146 mg/dL — ABNORMAL HIGH (ref 70–99)
Glucose-Capillary: 226 mg/dL — ABNORMAL HIGH (ref 70–99)
Glucose-Capillary: 229 mg/dL — ABNORMAL HIGH (ref 70–99)
Glucose-Capillary: 247 mg/dL — ABNORMAL HIGH (ref 70–99)
Glucose-Capillary: 252 mg/dL — ABNORMAL HIGH (ref 70–99)
Glucose-Capillary: 255 mg/dL — ABNORMAL HIGH (ref 70–99)
Glucose-Capillary: 259 mg/dL — ABNORMAL HIGH (ref 70–99)
Glucose-Capillary: 272 mg/dL — ABNORMAL HIGH (ref 70–99)
Glucose-Capillary: 285 mg/dL — ABNORMAL HIGH (ref 70–99)
Glucose-Capillary: 296 mg/dL — ABNORMAL HIGH (ref 70–99)
Glucose-Capillary: 302 mg/dL — ABNORMAL HIGH (ref 70–99)
Glucose-Capillary: 344 mg/dL — ABNORMAL HIGH (ref 70–99)

## 2010-11-04 LAB — COMPREHENSIVE METABOLIC PANEL WITH GFR
ALT: 42 U/L — ABNORMAL HIGH (ref 0–35)
AST: 37 U/L (ref 0–37)
Albumin: 3.7 g/dL (ref 3.5–5.2)
Alkaline Phosphatase: 91 U/L (ref 39–117)
BUN: 25 mg/dL — ABNORMAL HIGH (ref 6–23)
CO2: 30 meq/L (ref 19–32)
Calcium: 9.1 mg/dL (ref 8.4–10.5)
Chloride: 95 meq/L — ABNORMAL LOW (ref 96–112)
Creatinine, Ser: 1.11 mg/dL (ref 0.4–1.2)
GFR calc non Af Amer: 51 mL/min — ABNORMAL LOW
Glucose, Bld: 138 mg/dL — ABNORMAL HIGH (ref 70–99)
Potassium: 2.9 meq/L — ABNORMAL LOW (ref 3.5–5.1)
Sodium: 135 meq/L (ref 135–145)
Total Bilirubin: 0.8 mg/dL (ref 0.3–1.2)
Total Protein: 6.4 g/dL (ref 6.0–8.3)

## 2010-11-04 LAB — POTASSIUM
Potassium: 3.2 mEq/L — ABNORMAL LOW (ref 3.5–5.1)
Potassium: 3.3 meq/L — ABNORMAL LOW (ref 3.5–5.1)

## 2010-11-04 LAB — DIFFERENTIAL
Basophils Absolute: 0 K/uL (ref 0.0–0.1)
Basophils Relative: 0 % (ref 0–1)
Eosinophils Absolute: 0.2 K/uL (ref 0.0–0.7)
Eosinophils Relative: 2 % (ref 0–5)
Lymphocytes Relative: 16 % (ref 12–46)
Lymphs Abs: 1.3 K/uL (ref 0.7–4.0)
Monocytes Absolute: 0.2 K/uL (ref 0.1–1.0)
Monocytes Relative: 2 % — ABNORMAL LOW (ref 3–12)
Neutro Abs: 6.5 K/uL (ref 1.7–7.7)
Neutrophils Relative %: 80 % — ABNORMAL HIGH (ref 43–77)

## 2010-11-04 LAB — APTT
aPTT: 30 seconds (ref 24–37)
aPTT: 33 s (ref 24–37)

## 2010-11-04 LAB — URINALYSIS, ROUTINE W REFLEX MICROSCOPIC
Bilirubin Urine: NEGATIVE
Glucose, UA: NEGATIVE mg/dL
Hgb urine dipstick: NEGATIVE
Ketones, ur: NEGATIVE mg/dL
Nitrite: NEGATIVE
Protein, ur: NEGATIVE mg/dL
Specific Gravity, Urine: 1.016 (ref 1.005–1.030)
Urobilinogen, UA: 0.2 mg/dL (ref 0.0–1.0)
pH: 6.5 (ref 5.0–8.0)

## 2010-11-04 LAB — CBC
HCT: 35.1 % — ABNORMAL LOW (ref 36.0–46.0)
HCT: 35.3 % — ABNORMAL LOW (ref 36.0–46.0)
Hemoglobin: 12 g/dL (ref 12.0–15.0)
Hemoglobin: 12.2 g/dL (ref 12.0–15.0)
MCHC: 34.2 g/dL (ref 30.0–36.0)
MCHC: 34.6 g/dL (ref 30.0–36.0)
MCV: 108 fL — ABNORMAL HIGH (ref 78.0–100.0)
MCV: 108.4 fL — ABNORMAL HIGH (ref 78.0–100.0)
Platelets: 183 10*3/uL (ref 150–400)
Platelets: 207 10*3/uL (ref 150–400)
RBC: 3.23 MIL/uL — ABNORMAL LOW (ref 3.87–5.11)
RBC: 3.27 MIL/uL — ABNORMAL LOW (ref 3.87–5.11)
RDW: 16.9 % — ABNORMAL HIGH (ref 11.5–15.5)
RDW: 16.9 % — ABNORMAL HIGH (ref 11.5–15.5)
WBC: 8.1 10*3/uL (ref 4.0–10.5)
WBC: 8.2 10*3/uL (ref 4.0–10.5)

## 2010-11-04 LAB — LIPID PANEL
Cholesterol: 169 mg/dL (ref 0–200)
HDL: 40 mg/dL (ref >39)
LDL Cholesterol: 72 mg/dL (ref 0–99)
Total CHOL/HDL Ratio: 4.2 RATIO
Triglycerides: 283 mg/dL — ABNORMAL HIGH (ref <150)
VLDL: 57 mg/dL — ABNORMAL HIGH (ref 0–40)

## 2010-11-04 LAB — BRAIN NATRIURETIC PEPTIDE: Pro B Natriuretic peptide (BNP): <30 pg/mL (ref 0.0–100.0)

## 2010-11-04 LAB — CK TOTAL AND CKMB (NOT AT ARMC)
CK, MB: 2.9 ng/mL (ref 0.3–4.0)
Relative Index: INVALID (ref 0.0–2.5)
Total CK: 81 U/L (ref 7–177)

## 2010-11-04 LAB — TROPONIN I: Troponin I: 0.01 ng/mL (ref 0.00–0.06)

## 2010-11-16 NOTE — Discharge Summary (Signed)
[name], [name]              ACCOUNT NO.:  [id number]  MEDICAL RECORD NO.:  [id number]           PATIENT TYPE:  I  LOCATION:  5038                         FACILITY:  [facility]  PHYSICIAN:  Jamal Collin. Hensel, M.D.DATE OF BIRTH:  July 25, 1951  DATE OF ADMISSION:  09/30/2010 DATE OF DISCHARGE:  10/06/2010                        DISCHARGE SUMMARY - REFERRING   DISCHARGE DIAGNOSES: 1. Deconditioning and falls. 2. Polypharmacy. 3. Urinary tract infection. 4. Diabetes mellitus type 2. 5. Diastolic heart failure. 6. Schizophrenia.  DISCHARGE MEDICATIONS:  New medications: 1. Aspirin 81 mg p.o. daily. 2. Ciprofloxacin 500 mg p.o. b.i.d. take for 2 more days. 3. Lasix 40 mg p.o. daily. Continued medications: 1. Advair Diskus 500/50 one puff inhaled b.i.d. 2. Albuterol nebulization 2.5 mg per 3 mL solution one nebulization     inhaled q.4 h. p.r.n. for wheezing. 3. Bethanechol 5 mg p.o. daily. 4. Calcium 500 mg p.o. at bedtime. 5. Cetirizine 10 mg p.o. daily p.r.n. for allergies. 6. Clonidine 0.2 mg 2 tablets p.o. daily at bedtime. 7. Doxepin 125 mg p.o. daily at bedtime. 8. Ferrous sulfate 325 mg p.o. t.i.d. 9. Fluticasone nasal 50 mcg 1 spray each nostril b.i.d. p.r.n. for     congestion. 10.Folic acid 1 mg p.o. daily at bedtime. 11.Vicodin 5/325 one tablet p.o. at bedtime p.r.n. for pain in feet     and legs. 12.Lantus 60 units subcu b.i.d. 13.Lexapro 10 mg p.o. daily. 14.Methotrexate 10 mg p.o. on Saturdays and Sundays. 15.Nexium 14 mg p.o. b.i.d. 16.NovoLog sliding scale insulin 15 units subcu t.i.d. with meals. 17.Potassium chloride 20 mEq 1 tablet p.o. daily. 18.Prednisone 5 mg 1 tablet p.o. q.a.m. 19.Ranitidine 300 mg 1 tablet p.o. daily at bedtime. 20.Albuterol inhaler 1 puff inhaled q.4 h. p.r.n. shortness of breath. 21.Vitamin D 1000 units 1 tablet p.o. daily. Discontinued medications: 1. Metformin 500 mg 1 tablet p.o. b.i.d. 2. Zocor 10 mg p.o. daily. 3.  Allopurinol 300 mg p.o. b.i.d. 4. Lyrica 100 mg p.o. t.i.d. 5. Warfarin 5 mg p.o. at bedtime. 6. Vitamin D2 5000 units 1 tablet p.o. weekly. 7. Hydrochlorothiazide 25 mg p.o. b.i.d.  PERTINENT LABORATORY VALUES:  On September 30, 2010, a CBC with differential; white blood cell count 11.9, hemoglobin 13.3, hematocrit 37.2, platelets 168.  Basic metabolic panel; sodium [id number], potassium 3.6, chloride 97, CO2 of 23, glucose 543, BUN 49, creatinine 1.38, calcium 8.8.  Urinalysis was positive for greater than 1000 glucose, positive nitrites, small leukocyte.  Urine microscopic; rare epithelial cells, 7- 10 white blood cells, 0-2 red blood cells, and many bacteria.  Urine culture; positive for greater than 100,000 colonies of Providencia alcalifaciens, sensitive to ciprofloxacin. On February 19, 0000000, basic metabolic panel; sodium [id number], potassium 4.38, chloride 108, CO2 of 25, glucose 181, BUN 8, creatinine 0.87, calcium 8.7.  CBC; white blood cell count 6.1, hemoglobin 37.3, hematocrit 100.3, platelets 149. On September 30, 2010, chest x-ray 2-view showed low lung volumes with retrocardiac atelectasis.  X-ray of pelvis 2-view showed no acute fracture or dislocation identified about the pelvis, and hip x-ray 2- view showed no acute fracture or dislocation identified about the right hip.  BRIEF HOSPITAL COURSE:  [name] is a 60 year old female, who presented to the hospital after a fall and was found to be hyperglycemic with a urinalysis suggestive of an UTI. 1. Falls.  The patient has had several falls recently, and she was     evaluated by Physical Therapy, who felt that the patient was     deconditioned and would benefit from a stay at a skilled nursing     facility with rehab.  She worked with physical therapy here in the     hospital.  The patient had previously been on Coumadin for history     of pulmonary embolus and remote history of DVTs, however, due to     the patient's falls  and polypharmacy, the [facility] Medicine Team felt     that being on Coumadin was a greater risk to the patient for a     bleed than the risks of blood clots, so this was discontinued and     aspirin was started. 2. Urinary tract infection.  The patient has a positive urine culture.     She was initially started on ceftriaxone, which was changed to p.o.     ciprofloxacin to complete a 7-day course.  She was discharged with     2 more days of antibiotics. 3. Hyperglycemia.  The patient was placed on IV insulin drip glucose     stabilizer while in the emergency room.  She did not have an anion     gap, so the drip was continued till her blood sugar was below 180.     She was transitioned to subcutaneous insulin.  She was given Lantus     and sliding scale insulin during this hospitalization.  Her     hemoglobin A1c, most recently in clinic, was in the 8 range.  The     patient was discharged with her previous insulin regimen.  However,     metformin was stopped during this hospitalization because she was     on so many medicines, and the [facility] Medicine Team felt that it was     prudent to try and discontinue some medicines. 4. Blood pressure.  The patient was placed on her home clonidine     during this hospitalization, however, hydrochlorothiazide was held.     The patient's blood pressures were well controlled without     hydrochlorothiazide, so was discontinued at discharge. 5. Schizophrenia.  The patient has a history of schizophrenia and has     been stable on doxepin for a long time.  She takes 125 mg p.o. at     bedtime.  The family medicine team attempted to decrease the     doxepin due to concern for dizziness causing falls; however, the     patient was resistant to this and insisted on continuing her same     dose, so she was discharged with the same dose. 6. Placement.  The patient was evaluated by Physical Therapy, who felt     she would benefit from short-term rehab in a skilled  nursing     facility.  Social worker was consulted and this was arranged.  The     patient will be discharged to SNF with physical therapy there.  FOLLOWUP ISSUES/RECOMMENDATIONS:  The patient should follow up with Dr. Juleen China at [facility] in 4-6 weeks.  The patient was discharged to SNF in stable medical condition.    ______________________________ Cletus Gash, MD   ______________________________ Jamal Collin. Hensel,  M.D.    CR/MEDQ  D:  10/06/2010  T:  10/06/2010  Job:  [id number]  Electronically Signed by Cletus Gash MD on 11/11/2010 02:23:45 PM Electronically Signed by Madison Hickman M.D. on 11/16/2010 08:51:39 AM

## 2010-11-24 LAB — LIPID PANEL
Cholesterol: 125 mg/dL (ref 0–200)
HDL: 28 mg/dL — ABNORMAL LOW (ref >39)
LDL Cholesterol: 76 mg/dL (ref 0–99)
Total CHOL/HDL Ratio: 4.5 ratio
Triglycerides: 104 mg/dL (ref <150)
VLDL: 21 mg/dL (ref 0–40)

## 2010-11-24 LAB — GLUCOSE, CAPILLARY
Glucose-Capillary: 146 mg/dL — ABNORMAL HIGH (ref 70–99)
Glucose-Capillary: 170 mg/dL — ABNORMAL HIGH (ref 70–99)
Glucose-Capillary: 197 mg/dL — ABNORMAL HIGH (ref 70–99)
Glucose-Capillary: 203 mg/dL — ABNORMAL HIGH (ref 70–99)
Glucose-Capillary: 207 mg/dL — ABNORMAL HIGH (ref 70–99)
Glucose-Capillary: 217 mg/dL — ABNORMAL HIGH (ref 70–99)
Glucose-Capillary: 227 mg/dL — ABNORMAL HIGH (ref 70–99)
Glucose-Capillary: 228 mg/dL — ABNORMAL HIGH (ref 70–99)
Glucose-Capillary: 233 mg/dL — ABNORMAL HIGH (ref 70–99)
Glucose-Capillary: 251 mg/dL — ABNORMAL HIGH (ref 70–99)
Glucose-Capillary: 254 mg/dL — ABNORMAL HIGH (ref 70–99)
Glucose-Capillary: 272 mg/dL — ABNORMAL HIGH (ref 70–99)
Glucose-Capillary: 277 mg/dL — ABNORMAL HIGH (ref 70–99)
Glucose-Capillary: 294 mg/dL — ABNORMAL HIGH (ref 70–99)
Glucose-Capillary: 303 mg/dL — ABNORMAL HIGH (ref 70–99)
Glucose-Capillary: 305 mg/dL — ABNORMAL HIGH (ref 70–99)
Glucose-Capillary: 307 mg/dL — ABNORMAL HIGH (ref 70–99)
Glucose-Capillary: 309 mg/dL — ABNORMAL HIGH (ref 70–99)
Glucose-Capillary: 347 mg/dL — ABNORMAL HIGH (ref 70–99)
Glucose-Capillary: 310 mg/dL — ABNORMAL HIGH (ref 70–99)

## 2010-11-24 LAB — CARDIAC PANEL(CRET KIN+CKTOT+MB+TROPI)
CK, MB: 2.9 ng/mL (ref 0.3–4.0)
CK, MB: 4.1 ng/mL — ABNORMAL HIGH (ref 0.3–4.0)
CK, MB: 7.3 ng/mL — ABNORMAL HIGH (ref 0.3–4.0)
Relative Index: 1.8 (ref 0.0–2.5)
Relative Index: 1.9 (ref 0.0–2.5)
Relative Index: 5 — ABNORMAL HIGH (ref 0.0–2.5)
Total CK: 146 U/L (ref 7–177)
Total CK: 163 U/L (ref 7–177)
Total CK: 213 U/L — ABNORMAL HIGH (ref 7–177)
Troponin I: 0.01 ng/mL (ref 0.00–0.06)
Troponin I: 0.01 ng/mL (ref 0.00–0.06)
Troponin I: 0.02 ng/mL (ref 0.00–0.06)

## 2010-11-24 LAB — BASIC METABOLIC PANEL WITH GFR
BUN: 13 mg/dL (ref 6–23)
BUN: 15 mg/dL (ref 6–23)
BUN: 9 mg/dL (ref 6–23)
CO2: 28 meq/L (ref 19–32)
CO2: 30 meq/L (ref 19–32)
CO2: 31 meq/L (ref 19–32)
Calcium: 8.1 mg/dL — ABNORMAL LOW (ref 8.4–10.5)
Calcium: 8.8 mg/dL (ref 8.4–10.5)
Calcium: 8.9 mg/dL (ref 8.4–10.5)
Chloride: 100 meq/L (ref 96–112)
Chloride: 100 meq/L (ref 96–112)
Chloride: 98 meq/L (ref 96–112)
Creatinine, Ser: 0.93 mg/dL (ref 0.4–1.2)
Creatinine, Ser: 0.94 mg/dL (ref 0.4–1.2)
Creatinine, Ser: 1.04 mg/dL (ref 0.4–1.2)
GFR calc Af Amer: >60 mL/min (ref >60)
GFR calc Af Amer: >60 mL/min (ref >60)
GFR calc Af Amer: >60 mL/min (ref >60)
GFR calc non Af Amer: 55 mL/min — ABNORMAL LOW (ref >60)
GFR calc non Af Amer: >60 mL/min (ref >60)
GFR calc non Af Amer: >60 mL/min (ref >60)
Glucose, Bld: 235 mg/dL — ABNORMAL HIGH (ref 70–99)
Glucose, Bld: 265 mg/dL — ABNORMAL HIGH (ref 70–99)
Glucose, Bld: 380 mg/dL — ABNORMAL HIGH (ref 70–99)
Potassium: 3 meq/L — ABNORMAL LOW (ref 3.5–5.1)
Potassium: 3.5 meq/L (ref 3.5–5.1)
Potassium: 3.7 meq/L (ref 3.5–5.1)
Sodium: 137 meq/L (ref 135–145)
Sodium: 138 meq/L (ref 135–145)
Sodium: 140 meq/L (ref 135–145)

## 2010-11-24 LAB — BASIC METABOLIC PANEL
BUN: 10 mg/dL (ref 6–23)
BUN: 11 mg/dL (ref 6–23)
BUN: 11 mg/dL (ref 6–23)
BUN: 17 mg/dL (ref 6–23)
CO2: 30 mEq/L (ref 19–32)
CO2: 30 mEq/L (ref 19–32)
CO2: 33 mEq/L — ABNORMAL HIGH (ref 19–32)
CO2: 31 mEq/L (ref 19–32)
Calcium: 8.3 mg/dL — ABNORMAL LOW (ref 8.4–10.5)
Calcium: 8.4 mg/dL (ref 8.4–10.5)
Calcium: 9 mg/dL (ref 8.4–10.5)
Calcium: 9.2 mg/dL (ref 8.4–10.5)
Chloride: 102 mEq/L (ref 96–112)
Chloride: 103 mEq/L (ref 96–112)
Chloride: 99 mEq/L (ref 96–112)
Chloride: 93 mEq/L — ABNORMAL LOW (ref 96–112)
Creatinine, Ser: 0.83 mg/dL (ref 0.4–1.2)
Creatinine, Ser: 0.83 mg/dL (ref 0.4–1.2)
Creatinine, Ser: 0.87 mg/dL (ref 0.4–1.2)
Creatinine, Ser: 1.02 mg/dL (ref 0.4–1.2)
GFR calc Af Amer: >60 mL/min (ref >60)
GFR calc Af Amer: >60 mL/min (ref >60)
GFR calc Af Amer: >60 mL/min (ref >60)
GFR calc Af Amer: >60 mL/min (ref >60)
GFR calc non Af Amer: >60 mL/min (ref >60)
GFR calc non Af Amer: >60 mL/min (ref >60)
GFR calc non Af Amer: >60 mL/min (ref >60)
GFR calc non Af Amer: 56 mL/min — ABNORMAL LOW (ref >60)
Glucose, Bld: 166 mg/dL — ABNORMAL HIGH (ref 70–99)
Glucose, Bld: 190 mg/dL — ABNORMAL HIGH (ref 70–99)
Glucose, Bld: 223 mg/dL — ABNORMAL HIGH (ref 70–99)
Glucose, Bld: 377 mg/dL — ABNORMAL HIGH (ref 70–99)
Potassium: 2.7 mEq/L — CL (ref 3.5–5.1)
Potassium: 2.8 mEq/L — ABNORMAL LOW (ref 3.5–5.1)
Potassium: 3 mEq/L — ABNORMAL LOW (ref 3.5–5.1)
Potassium: 2.9 mEq/L — ABNORMAL LOW (ref 3.5–5.1)
Sodium: 139 mEq/L (ref 135–145)
Sodium: 140 mEq/L (ref 135–145)
Sodium: 141 mEq/L (ref 135–145)
Sodium: 133 mEq/L — ABNORMAL LOW (ref 135–145)

## 2010-11-24 LAB — CBC
HCT: 26.3 % — ABNORMAL LOW (ref 36.0–46.0)
HCT: 27.7 % — ABNORMAL LOW (ref 36.0–46.0)
HCT: 27.9 % — ABNORMAL LOW (ref 36.0–46.0)
HCT: 28 % — ABNORMAL LOW (ref 36.0–46.0)
HCT: 28.5 % — ABNORMAL LOW (ref 36.0–46.0)
HCT: 29.6 % — ABNORMAL LOW (ref 36.0–46.0)
Hemoglobin: 9.1 g/dL — ABNORMAL LOW (ref 12.0–15.0)
Hemoglobin: 9.6 g/dL — ABNORMAL LOW (ref 12.0–15.0)
Hemoglobin: 9.6 g/dL — ABNORMAL LOW (ref 12.0–15.0)
Hemoglobin: 9.7 g/dL — ABNORMAL LOW (ref 12.0–15.0)
Hemoglobin: 9.9 g/dL — ABNORMAL LOW (ref 12.0–15.0)
Hemoglobin: 10.2 g/dL — ABNORMAL LOW (ref 12.0–15.0)
MCHC: 34.5 g/dL (ref 30.0–36.0)
MCHC: 34.6 g/dL (ref 30.0–36.0)
MCHC: 34.6 g/dL (ref 30.0–36.0)
MCHC: 34.7 g/dL (ref 30.0–36.0)
MCHC: 34.8 g/dL (ref 30.0–36.0)
MCHC: 34.6 g/dL (ref 30.0–36.0)
MCV: 100.4 fL — ABNORMAL HIGH (ref 78.0–100.0)
MCV: 100.7 fL — ABNORMAL HIGH (ref 78.0–100.0)
MCV: 101.4 fL — ABNORMAL HIGH (ref 78.0–100.0)
MCV: 101.5 fL — ABNORMAL HIGH (ref 78.0–100.0)
MCV: 101.9 fL — ABNORMAL HIGH (ref 78.0–100.0)
MCV: 101.7 fL — ABNORMAL HIGH (ref 78.0–100.0)
Platelets: 159 K/uL (ref 150–400)
Platelets: 160 10*3/uL (ref 150–400)
Platelets: 194 10*3/uL (ref 150–400)
Platelets: 221 10*3/uL (ref 150–400)
Platelets: 238 10*3/uL (ref 150–400)
Platelets: 153 10*3/uL (ref 150–400)
RBC: 2.62 MIL/uL — ABNORMAL LOW (ref 3.87–5.11)
RBC: 2.73 MIL/uL — ABNORMAL LOW (ref 3.87–5.11)
RBC: 2.74 MIL/uL — ABNORMAL LOW (ref 3.87–5.11)
RBC: 2.78 MIL/uL — ABNORMAL LOW (ref 3.87–5.11)
RBC: 2.81 MIL/uL — ABNORMAL LOW (ref 3.87–5.11)
RBC: 2.91 MIL/uL — ABNORMAL LOW (ref 3.87–5.11)
RDW: 15.5 % (ref 11.5–15.5)
RDW: 15.7 % — ABNORMAL HIGH (ref 11.5–15.5)
RDW: 15.9 % — ABNORMAL HIGH (ref 11.5–15.5)
RDW: 16 % — ABNORMAL HIGH (ref 11.5–15.5)
RDW: 16.1 % — ABNORMAL HIGH (ref 11.5–15.5)
RDW: 15.2 % (ref 11.5–15.5)
WBC: 5.3 10*3/uL (ref 4.0–10.5)
WBC: 6.1 10*3/uL (ref 4.0–10.5)
WBC: 6.8 K/uL (ref 4.0–10.5)
WBC: 7.5 10*3/uL (ref 4.0–10.5)
WBC: 8.5 10*3/uL (ref 4.0–10.5)
WBC: 8.8 10*3/uL (ref 4.0–10.5)

## 2010-11-24 LAB — URINALYSIS, ROUTINE W REFLEX MICROSCOPIC
Bilirubin Urine: NEGATIVE
Glucose, UA: >1000 mg/dL — AB
Hgb urine dipstick: NEGATIVE
Ketones, ur: NEGATIVE mg/dL
Leukocytes, UA: NEGATIVE
Nitrite: NEGATIVE
Protein, ur: NEGATIVE mg/dL
Specific Gravity, Urine: 1.013 (ref 1.005–1.030)
Urobilinogen, UA: 0.2 mg/dL (ref 0.0–1.0)
pH: 7 (ref 5.0–8.0)

## 2010-11-24 LAB — DIFFERENTIAL
Basophils Absolute: 0 K/uL (ref 0.0–0.1)
Basophils Relative: 0 % (ref 0–1)
Eosinophils Absolute: 0.1 K/uL (ref 0.0–0.7)
Eosinophils Relative: 1 % (ref 0–5)
Lymphocytes Relative: 11 % — ABNORMAL LOW (ref 12–46)
Lymphs Abs: 0.9 K/uL (ref 0.7–4.0)
Monocytes Absolute: 0.5 K/uL (ref 0.1–1.0)
Monocytes Relative: 6 % (ref 3–12)
Neutro Abs: 7.2 K/uL (ref 1.7–7.7)
Neutrophils Relative %: 83 % — ABNORMAL HIGH (ref 43–77)

## 2010-11-24 LAB — LACTATE DEHYDROGENASE: LDH: 194 U/L (ref 94–250)

## 2010-11-24 LAB — APTT: aPTT: 74 seconds — ABNORMAL HIGH (ref 24–37)

## 2010-11-24 LAB — COMPREHENSIVE METABOLIC PANEL WITH GFR
ALT: 18 U/L (ref 0–35)
AST: 14 U/L (ref 0–37)
Albumin: 2.2 g/dL — ABNORMAL LOW (ref 3.5–5.2)
Alkaline Phosphatase: 76 U/L (ref 39–117)
BUN: 11 mg/dL (ref 6–23)
CO2: 33 meq/L — ABNORMAL HIGH (ref 19–32)
Calcium: 8.8 mg/dL (ref 8.4–10.5)
Chloride: 100 meq/L (ref 96–112)
Creatinine, Ser: 0.89 mg/dL (ref 0.4–1.2)
GFR calc Af Amer: >60 mL/min (ref >60)
GFR calc non Af Amer: >60 mL/min (ref >60)
Glucose, Bld: 255 mg/dL — ABNORMAL HIGH (ref 70–99)
Potassium: 3.3 meq/L — ABNORMAL LOW (ref 3.5–5.1)
Sodium: 140 meq/L (ref 135–145)
Total Bilirubin: 0.6 mg/dL (ref 0.3–1.2)
Total Protein: 5.9 g/dL — ABNORMAL LOW (ref 6.0–8.3)

## 2010-11-24 LAB — HEMOGLOBIN A1C
Hgb A1c MFr Bld: 9.9 % — ABNORMAL HIGH (ref 4.6–6.1)
Mean Plasma Glucose: 237 mg/dL

## 2010-11-24 LAB — CK TOTAL AND CKMB (NOT AT ARMC)
CK, MB: 2 ng/mL (ref 0.3–4.0)
Relative Index: INVALID (ref 0.0–2.5)
Total CK: 40 U/L (ref 7–177)

## 2010-11-24 LAB — CULTURE, BLOOD (ROUTINE X 2)
Culture: NO GROWTH
Culture: NO GROWTH

## 2010-11-24 LAB — BRAIN NATRIURETIC PEPTIDE
Pro B Natriuretic peptide (BNP): 125 pg/mL — ABNORMAL HIGH (ref 0.0–100.0)
Pro B Natriuretic peptide (BNP): 145 pg/mL — ABNORMAL HIGH (ref 0.0–100.0)

## 2010-11-24 LAB — URINE CULTURE
Colony Count: NO GROWTH
Culture: NO GROWTH

## 2010-11-24 LAB — PROTIME-INR
INR: 2.2 — ABNORMAL HIGH (ref 0.00–1.49)
INR: 2.2 — ABNORMAL HIGH (ref 0.00–1.49)
INR: 2.4 — ABNORMAL HIGH (ref 0.00–1.49)
INR: 2.5 — ABNORMAL HIGH (ref 0.00–1.49)
INR: 2.5 — ABNORMAL HIGH (ref 0.00–1.49)
INR: 2.3 — ABNORMAL HIGH (ref 0.00–1.49)
Prothrombin Time: 25.8 seconds — ABNORMAL HIGH (ref 11.6–15.2)
Prothrombin Time: 25.9 seconds — ABNORMAL HIGH (ref 11.6–15.2)
Prothrombin Time: 27.6 s — ABNORMAL HIGH (ref 11.6–15.2)
Prothrombin Time: 28.7 s — ABNORMAL HIGH (ref 11.6–15.2)
Prothrombin Time: 29.1 s — ABNORMAL HIGH (ref 11.6–15.2)
Prothrombin Time: 26.3 seconds — ABNORMAL HIGH (ref 11.6–15.2)

## 2010-11-24 LAB — URINE MICROSCOPIC-ADD ON

## 2010-11-24 LAB — MAGNESIUM: Magnesium: 1.8 mg/dL (ref 1.5–2.5)

## 2010-11-24 LAB — TROPONIN I: Troponin I: 0.01 ng/mL (ref 0.00–0.06)

## 2010-11-24 LAB — SEDIMENTATION RATE: Sed Rate: 108 mm/hr — ABNORMAL HIGH (ref 0–22)

## 2010-11-24 LAB — ANA: Anti Nuclear Antibody(ANA): NEGATIVE

## 2010-11-25 LAB — BASIC METABOLIC PANEL
BUN: 19 mg/dL (ref 6–23)
BUN: 28 mg/dL — ABNORMAL HIGH (ref 6–23)
BUN: 35 mg/dL — ABNORMAL HIGH (ref 6–23)
BUN: 35 mg/dL — ABNORMAL HIGH (ref 6–23)
BUN: 37 mg/dL — ABNORMAL HIGH (ref 6–23)
BUN: 39 mg/dL — ABNORMAL HIGH (ref 6–23)
BUN: 41 mg/dL — ABNORMAL HIGH (ref 6–23)
BUN: 41 mg/dL — ABNORMAL HIGH (ref 6–23)
BUN: 42 mg/dL — ABNORMAL HIGH (ref 6–23)
BUN: 44 mg/dL — ABNORMAL HIGH (ref 6–23)
BUN: 14 mg/dL (ref 6–23)
BUN: 16 mg/dL (ref 6–23)
BUN: 18 mg/dL (ref 6–23)
BUN: 20 mg/dL (ref 6–23)
BUN: 22 mg/dL (ref 6–23)
BUN: 22 mg/dL (ref 6–23)
BUN: 26 mg/dL — ABNORMAL HIGH (ref 6–23)
CO2: 24 mEq/L (ref 19–32)
CO2: 26 mEq/L (ref 19–32)
CO2: 27 mEq/L (ref 19–32)
CO2: 28 mEq/L (ref 19–32)
CO2: 29 mEq/L (ref 19–32)
CO2: 29 mEq/L (ref 19–32)
CO2: 29 mEq/L (ref 19–32)
CO2: 29 mEq/L (ref 19–32)
CO2: 30 mEq/L (ref 19–32)
CO2: 30 mEq/L (ref 19–32)
CO2: 24 mEq/L (ref 19–32)
CO2: 25 mEq/L (ref 19–32)
CO2: 26 mEq/L (ref 19–32)
CO2: 26 mEq/L (ref 19–32)
CO2: 27 mEq/L (ref 19–32)
CO2: 27 mEq/L (ref 19–32)
CO2: 29 mEq/L (ref 19–32)
Calcium: 8.4 mg/dL (ref 8.4–10.5)
Calcium: 8.5 mg/dL (ref 8.4–10.5)
Calcium: 8.6 mg/dL (ref 8.4–10.5)
Calcium: 9 mg/dL (ref 8.4–10.5)
Calcium: 9.1 mg/dL (ref 8.4–10.5)
Calcium: 9.2 mg/dL (ref 8.4–10.5)
Calcium: 9.2 mg/dL (ref 8.4–10.5)
Calcium: 9.3 mg/dL (ref 8.4–10.5)
Calcium: 9.4 mg/dL (ref 8.4–10.5)
Calcium: 9.6 mg/dL (ref 8.4–10.5)
Calcium: 7.7 mg/dL — ABNORMAL LOW (ref 8.4–10.5)
Calcium: 8 mg/dL — ABNORMAL LOW (ref 8.4–10.5)
Calcium: 8.4 mg/dL (ref 8.4–10.5)
Calcium: 8.8 mg/dL (ref 8.4–10.5)
Calcium: 8.9 mg/dL (ref 8.4–10.5)
Calcium: 8.9 mg/dL (ref 8.4–10.5)
Calcium: 9 mg/dL (ref 8.4–10.5)
Chloride: 100 mEq/L (ref 96–112)
Chloride: 103 mEq/L (ref 96–112)
Chloride: 104 mEq/L (ref 96–112)
Chloride: 107 mEq/L (ref 96–112)
Chloride: 107 mEq/L (ref 96–112)
Chloride: 110 mEq/L (ref 96–112)
Chloride: 110 mEq/L (ref 96–112)
Chloride: 115 mEq/L — ABNORMAL HIGH (ref 96–112)
Chloride: 117 mEq/L — ABNORMAL HIGH (ref 96–112)
Chloride: 118 mEq/L — ABNORMAL HIGH (ref 96–112)
Chloride: 102 mEq/L (ref 96–112)
Chloride: 102 mEq/L (ref 96–112)
Chloride: 107 mEq/L (ref 96–112)
Chloride: 107 mEq/L (ref 96–112)
Chloride: 109 mEq/L (ref 96–112)
Chloride: 92 mEq/L — ABNORMAL LOW (ref 96–112)
Chloride: 96 mEq/L (ref 96–112)
Creatinine, Ser: 0.95 mg/dL (ref 0.4–1.2)
Creatinine, Ser: 0.96 mg/dL (ref 0.4–1.2)
Creatinine, Ser: 1.1 mg/dL (ref 0.4–1.2)
Creatinine, Ser: 1.2 mg/dL (ref 0.4–1.2)
Creatinine, Ser: 1.23 mg/dL — ABNORMAL HIGH (ref 0.4–1.2)
Creatinine, Ser: 1.24 mg/dL — ABNORMAL HIGH (ref 0.4–1.2)
Creatinine, Ser: 1.27 mg/dL — ABNORMAL HIGH (ref 0.4–1.2)
Creatinine, Ser: 1.29 mg/dL — ABNORMAL HIGH (ref 0.4–1.2)
Creatinine, Ser: 1.31 mg/dL — ABNORMAL HIGH (ref 0.4–1.2)
Creatinine, Ser: 1.37 mg/dL — ABNORMAL HIGH (ref 0.4–1.2)
Creatinine, Ser: 0.73 mg/dL (ref 0.4–1.2)
Creatinine, Ser: 0.83 mg/dL (ref 0.4–1.2)
Creatinine, Ser: 1.12 mg/dL (ref 0.4–1.2)
Creatinine, Ser: 1.18 mg/dL (ref 0.4–1.2)
Creatinine, Ser: 1.24 mg/dL — ABNORMAL HIGH (ref 0.4–1.2)
Creatinine, Ser: 1.41 mg/dL — ABNORMAL HIGH (ref 0.4–1.2)
Creatinine, Ser: 1.52 mg/dL — ABNORMAL HIGH (ref 0.4–1.2)
GFR calc Af Amer: 48 mL/min — ABNORMAL LOW (ref >60)
GFR calc Af Amer: 51 mL/min — ABNORMAL LOW (ref >60)
GFR calc Af Amer: 52 mL/min — ABNORMAL LOW (ref >60)
GFR calc Af Amer: 52 mL/min — ABNORMAL LOW (ref >60)
GFR calc Af Amer: 54 mL/min — ABNORMAL LOW (ref >60)
GFR calc Af Amer: 54 mL/min — ABNORMAL LOW (ref >60)
GFR calc Af Amer: 56 mL/min — ABNORMAL LOW (ref >60)
GFR calc Af Amer: >60 mL/min (ref >60)
GFR calc Af Amer: >60 mL/min (ref >60)
GFR calc Af Amer: >60 mL/min (ref >60)
GFR calc Af Amer: 43 mL/min — ABNORMAL LOW (ref >60)
GFR calc Af Amer: 47 mL/min — ABNORMAL LOW (ref >60)
GFR calc Af Amer: 54 mL/min — ABNORMAL LOW (ref >60)
GFR calc Af Amer: 57 mL/min — ABNORMAL LOW (ref >60)
GFR calc Af Amer: >60 mL/min (ref >60)
GFR calc Af Amer: >60 mL/min (ref >60)
GFR calc Af Amer: >60 mL/min (ref >60)
GFR calc non Af Amer: 40 mL/min — ABNORMAL LOW (ref >60)
GFR calc non Af Amer: 42 mL/min — ABNORMAL LOW (ref >60)
GFR calc non Af Amer: 43 mL/min — ABNORMAL LOW (ref >60)
GFR calc non Af Amer: 43 mL/min — ABNORMAL LOW (ref >60)
GFR calc non Af Amer: 45 mL/min — ABNORMAL LOW (ref >60)
GFR calc non Af Amer: 45 mL/min — ABNORMAL LOW (ref >60)
GFR calc non Af Amer: 46 mL/min — ABNORMAL LOW (ref >60)
GFR calc non Af Amer: 51 mL/min — ABNORMAL LOW (ref >60)
GFR calc non Af Amer: 60 mL/min — ABNORMAL LOW (ref >60)
GFR calc non Af Amer: >60 mL/min (ref >60)
GFR calc non Af Amer: 35 mL/min — ABNORMAL LOW (ref >60)
GFR calc non Af Amer: 38 mL/min — ABNORMAL LOW (ref >60)
GFR calc non Af Amer: 45 mL/min — ABNORMAL LOW (ref >60)
GFR calc non Af Amer: 47 mL/min — ABNORMAL LOW (ref >60)
GFR calc non Af Amer: 50 mL/min — ABNORMAL LOW (ref >60)
GFR calc non Af Amer: >60 mL/min (ref >60)
GFR calc non Af Amer: >60 mL/min (ref >60)
Glucose, Bld: 146 mg/dL — ABNORMAL HIGH (ref 70–99)
Glucose, Bld: 176 mg/dL — ABNORMAL HIGH (ref 70–99)
Glucose, Bld: 210 mg/dL — ABNORMAL HIGH (ref 70–99)
Glucose, Bld: 249 mg/dL — ABNORMAL HIGH (ref 70–99)
Glucose, Bld: 259 mg/dL — ABNORMAL HIGH (ref 70–99)
Glucose, Bld: 264 mg/dL — ABNORMAL HIGH (ref 70–99)
Glucose, Bld: 264 mg/dL — ABNORMAL HIGH (ref 70–99)
Glucose, Bld: 291 mg/dL — ABNORMAL HIGH (ref 70–99)
Glucose, Bld: 320 mg/dL — ABNORMAL HIGH (ref 70–99)
Glucose, Bld: 352 mg/dL — ABNORMAL HIGH (ref 70–99)
Glucose, Bld: 157 mg/dL — ABNORMAL HIGH (ref 70–99)
Glucose, Bld: 221 mg/dL — ABNORMAL HIGH (ref 70–99)
Glucose, Bld: 231 mg/dL — ABNORMAL HIGH (ref 70–99)
Glucose, Bld: 265 mg/dL — ABNORMAL HIGH (ref 70–99)
Glucose, Bld: 287 mg/dL — ABNORMAL HIGH (ref 70–99)
Glucose, Bld: 446 mg/dL — ABNORMAL HIGH (ref 70–99)
Glucose, Bld: 461 mg/dL — ABNORMAL HIGH (ref 70–99)
Potassium: 3.1 mEq/L — ABNORMAL LOW (ref 3.5–5.1)
Potassium: 3.6 mEq/L (ref 3.5–5.1)
Potassium: 3.6 mEq/L (ref 3.5–5.1)
Potassium: 4 mEq/L (ref 3.5–5.1)
Potassium: 4.1 mEq/L (ref 3.5–5.1)
Potassium: 4.1 mEq/L (ref 3.5–5.1)
Potassium: 4.2 mEq/L (ref 3.5–5.1)
Potassium: 4.2 mEq/L (ref 3.5–5.1)
Potassium: 4.3 mEq/L (ref 3.5–5.1)
Potassium: 5 mEq/L (ref 3.5–5.1)
Potassium: 2.9 mEq/L — ABNORMAL LOW (ref 3.5–5.1)
Potassium: 3.2 mEq/L — ABNORMAL LOW (ref 3.5–5.1)
Potassium: 3.4 mEq/L — ABNORMAL LOW (ref 3.5–5.1)
Potassium: 3.5 mEq/L (ref 3.5–5.1)
Potassium: 3.6 mEq/L (ref 3.5–5.1)
Potassium: 4 mEq/L (ref 3.5–5.1)
Potassium: 4 mEq/L (ref 3.5–5.1)
Sodium: 136 mEq/L (ref 135–145)
Sodium: 140 mEq/L (ref 135–145)
Sodium: 142 mEq/L (ref 135–145)
Sodium: 143 mEq/L (ref 135–145)
Sodium: 143 mEq/L (ref 135–145)
Sodium: 145 mEq/L (ref 135–145)
Sodium: 146 mEq/L — ABNORMAL HIGH (ref 135–145)
Sodium: 153 mEq/L — ABNORMAL HIGH (ref 135–145)
Sodium: 155 mEq/L — ABNORMAL HIGH (ref 135–145)
Sodium: 155 mEq/L — ABNORMAL HIGH (ref 135–145)
Sodium: 133 mEq/L — ABNORMAL LOW (ref 135–145)
Sodium: 134 mEq/L — ABNORMAL LOW (ref 135–145)
Sodium: 135 mEq/L (ref 135–145)
Sodium: 138 mEq/L (ref 135–145)
Sodium: 139 mEq/L (ref 135–145)
Sodium: 139 mEq/L (ref 135–145)
Sodium: 141 mEq/L (ref 135–145)

## 2010-11-25 LAB — URINE CULTURE
Colony Count: >=100000
Colony Count: >=100000
Special Requests: NEGATIVE

## 2010-11-25 LAB — GLUCOSE, CAPILLARY
Glucose-Capillary: 129 mg/dL — ABNORMAL HIGH (ref 70–99)
Glucose-Capillary: 174 mg/dL — ABNORMAL HIGH (ref 70–99)
Glucose-Capillary: 177 mg/dL — ABNORMAL HIGH (ref 70–99)
Glucose-Capillary: 185 mg/dL — ABNORMAL HIGH (ref 70–99)
Glucose-Capillary: 199 mg/dL — ABNORMAL HIGH (ref 70–99)
Glucose-Capillary: 207 mg/dL — ABNORMAL HIGH (ref 70–99)
Glucose-Capillary: 221 mg/dL — ABNORMAL HIGH (ref 70–99)
Glucose-Capillary: 229 mg/dL — ABNORMAL HIGH (ref 70–99)
Glucose-Capillary: 246 mg/dL — ABNORMAL HIGH (ref 70–99)
Glucose-Capillary: 247 mg/dL — ABNORMAL HIGH (ref 70–99)
Glucose-Capillary: 249 mg/dL — ABNORMAL HIGH (ref 70–99)
Glucose-Capillary: 249 mg/dL — ABNORMAL HIGH (ref 70–99)
Glucose-Capillary: 251 mg/dL — ABNORMAL HIGH (ref 70–99)
Glucose-Capillary: 256 mg/dL — ABNORMAL HIGH (ref 70–99)
Glucose-Capillary: 257 mg/dL — ABNORMAL HIGH (ref 70–99)
Glucose-Capillary: 266 mg/dL — ABNORMAL HIGH (ref 70–99)
Glucose-Capillary: 269 mg/dL — ABNORMAL HIGH (ref 70–99)
Glucose-Capillary: 271 mg/dL — ABNORMAL HIGH (ref 70–99)
Glucose-Capillary: 275 mg/dL — ABNORMAL HIGH (ref 70–99)
Glucose-Capillary: 279 mg/dL — ABNORMAL HIGH (ref 70–99)
Glucose-Capillary: 287 mg/dL — ABNORMAL HIGH (ref 70–99)
Glucose-Capillary: 294 mg/dL — ABNORMAL HIGH (ref 70–99)
Glucose-Capillary: 303 mg/dL — ABNORMAL HIGH (ref 70–99)
Glucose-Capillary: 319 mg/dL — ABNORMAL HIGH (ref 70–99)
Glucose-Capillary: 347 mg/dL — ABNORMAL HIGH (ref 70–99)
Glucose-Capillary: 354 mg/dL — ABNORMAL HIGH (ref 70–99)
Glucose-Capillary: 358 mg/dL — ABNORMAL HIGH (ref 70–99)
Glucose-Capillary: 379 mg/dL — ABNORMAL HIGH (ref 70–99)
Glucose-Capillary: 384 mg/dL — ABNORMAL HIGH (ref 70–99)
Glucose-Capillary: 422 mg/dL — ABNORMAL HIGH (ref 70–99)
Glucose-Capillary: 439 mg/dL — ABNORMAL HIGH (ref 70–99)
Glucose-Capillary: 109 mg/dL — ABNORMAL HIGH (ref 70–99)
Glucose-Capillary: 138 mg/dL — ABNORMAL HIGH (ref 70–99)
Glucose-Capillary: 140 mg/dL — ABNORMAL HIGH (ref 70–99)
Glucose-Capillary: 151 mg/dL — ABNORMAL HIGH (ref 70–99)
Glucose-Capillary: 155 mg/dL — ABNORMAL HIGH (ref 70–99)
Glucose-Capillary: 157 mg/dL — ABNORMAL HIGH (ref 70–99)
Glucose-Capillary: 160 mg/dL — ABNORMAL HIGH (ref 70–99)
Glucose-Capillary: 161 mg/dL — ABNORMAL HIGH (ref 70–99)
Glucose-Capillary: 162 mg/dL — ABNORMAL HIGH (ref 70–99)
Glucose-Capillary: 169 mg/dL — ABNORMAL HIGH (ref 70–99)
Glucose-Capillary: 175 mg/dL — ABNORMAL HIGH (ref 70–99)
Glucose-Capillary: 193 mg/dL — ABNORMAL HIGH (ref 70–99)
Glucose-Capillary: 199 mg/dL — ABNORMAL HIGH (ref 70–99)
Glucose-Capillary: 200 mg/dL — ABNORMAL HIGH (ref 70–99)
Glucose-Capillary: 201 mg/dL — ABNORMAL HIGH (ref 70–99)
Glucose-Capillary: 207 mg/dL — ABNORMAL HIGH (ref 70–99)
Glucose-Capillary: 212 mg/dL — ABNORMAL HIGH (ref 70–99)
Glucose-Capillary: 218 mg/dL — ABNORMAL HIGH (ref 70–99)
Glucose-Capillary: 220 mg/dL — ABNORMAL HIGH (ref 70–99)
Glucose-Capillary: 220 mg/dL — ABNORMAL HIGH (ref 70–99)
Glucose-Capillary: 221 mg/dL — ABNORMAL HIGH (ref 70–99)
Glucose-Capillary: 223 mg/dL — ABNORMAL HIGH (ref 70–99)
Glucose-Capillary: 224 mg/dL — ABNORMAL HIGH (ref 70–99)
Glucose-Capillary: 224 mg/dL — ABNORMAL HIGH (ref 70–99)
Glucose-Capillary: 226 mg/dL — ABNORMAL HIGH (ref 70–99)
Glucose-Capillary: 228 mg/dL — ABNORMAL HIGH (ref 70–99)
Glucose-Capillary: 231 mg/dL — ABNORMAL HIGH (ref 70–99)
Glucose-Capillary: 232 mg/dL — ABNORMAL HIGH (ref 70–99)
Glucose-Capillary: 233 mg/dL — ABNORMAL HIGH (ref 70–99)
Glucose-Capillary: 235 mg/dL — ABNORMAL HIGH (ref 70–99)
Glucose-Capillary: 239 mg/dL — ABNORMAL HIGH (ref 70–99)
Glucose-Capillary: 244 mg/dL — ABNORMAL HIGH (ref 70–99)
Glucose-Capillary: 244 mg/dL — ABNORMAL HIGH (ref 70–99)
Glucose-Capillary: 250 mg/dL — ABNORMAL HIGH (ref 70–99)
Glucose-Capillary: 250 mg/dL — ABNORMAL HIGH (ref 70–99)
Glucose-Capillary: 253 mg/dL — ABNORMAL HIGH (ref 70–99)
Glucose-Capillary: 256 mg/dL — ABNORMAL HIGH (ref 70–99)
Glucose-Capillary: 259 mg/dL — ABNORMAL HIGH (ref 70–99)
Glucose-Capillary: 268 mg/dL — ABNORMAL HIGH (ref 70–99)
Glucose-Capillary: 269 mg/dL — ABNORMAL HIGH (ref 70–99)
Glucose-Capillary: 271 mg/dL — ABNORMAL HIGH (ref 70–99)
Glucose-Capillary: 276 mg/dL — ABNORMAL HIGH (ref 70–99)
Glucose-Capillary: 289 mg/dL — ABNORMAL HIGH (ref 70–99)
Glucose-Capillary: 291 mg/dL — ABNORMAL HIGH (ref 70–99)
Glucose-Capillary: 294 mg/dL — ABNORMAL HIGH (ref 70–99)
Glucose-Capillary: 296 mg/dL — ABNORMAL HIGH (ref 70–99)
Glucose-Capillary: 299 mg/dL — ABNORMAL HIGH (ref 70–99)
Glucose-Capillary: 305 mg/dL — ABNORMAL HIGH (ref 70–99)
Glucose-Capillary: 313 mg/dL — ABNORMAL HIGH (ref 70–99)
Glucose-Capillary: 341 mg/dL — ABNORMAL HIGH (ref 70–99)
Glucose-Capillary: 341 mg/dL — ABNORMAL HIGH (ref 70–99)
Glucose-Capillary: 350 mg/dL — ABNORMAL HIGH (ref 70–99)
Glucose-Capillary: 441 mg/dL — ABNORMAL HIGH (ref 70–99)
Glucose-Capillary: 442 mg/dL — ABNORMAL HIGH (ref 70–99)
Glucose-Capillary: 496 mg/dL — ABNORMAL HIGH (ref 70–99)
Glucose-Capillary: 517 mg/dL (ref 70–99)

## 2010-11-25 LAB — POCT I-STAT 3, ART BLOOD GAS (G3+)
Acid-Base Excess: 4 mmol/L — ABNORMAL HIGH (ref 0.0–2.0)
Acid-Base Excess: 5 mmol/L — ABNORMAL HIGH (ref 0.0–2.0)
Acid-Base Excess: 1 mmol/L (ref 0.0–2.0)
Acid-Base Excess: 1 mmol/L (ref 0.0–2.0)
Acid-Base Excess: 2 mmol/L (ref 0.0–2.0)
Acid-Base Excess: 3 mmol/L — ABNORMAL HIGH (ref 0.0–2.0)
Acid-base deficit: 1 mmol/L (ref 0.0–2.0)
Bicarbonate: 22.5 mEq/L (ref 20.0–24.0)
Bicarbonate: 27.7 mEq/L — ABNORMAL HIGH (ref 20.0–24.0)
Bicarbonate: 28.5 mEq/L — ABNORMAL HIGH (ref 20.0–24.0)
Bicarbonate: 23.9 mEq/L (ref 20.0–24.0)
Bicarbonate: 24.8 mEq/L — ABNORMAL HIGH (ref 20.0–24.0)
Bicarbonate: 26 mEq/L — ABNORMAL HIGH (ref 20.0–24.0)
Bicarbonate: 26.9 mEq/L — ABNORMAL HIGH (ref 20.0–24.0)
Bicarbonate: 27.5 mEq/L — ABNORMAL HIGH (ref 20.0–24.0)
O2 Saturation: 94 %
O2 Saturation: 97 %
O2 Saturation: 97 %
O2 Saturation: 62 %
O2 Saturation: 94 %
O2 Saturation: 94 %
O2 Saturation: 95 %
O2 Saturation: 98 %
Patient temperature: 98.7
Patient temperature: 99.9
Patient temperature: 37
Patient temperature: 98.4
Patient temperature: 98.6
Patient temperature: 98.6
TCO2: 23 mmol/L (ref 0–100)
TCO2: 29 mmol/L (ref 0–100)
TCO2: 30 mmol/L (ref 0–100)
TCO2: 25 mmol/L (ref 0–100)
TCO2: 26 mmol/L (ref 0–100)
TCO2: 27 mmol/L (ref 0–100)
TCO2: 28 mmol/L (ref 0–100)
TCO2: 29 mmol/L (ref 0–100)
pCO2 arterial: 32.8 mmHg — ABNORMAL LOW (ref 35.0–45.0)
pCO2 arterial: 37.3 mmHg (ref 35.0–45.0)
pCO2 arterial: 38.1 mmHg (ref 35.0–45.0)
pCO2 arterial: 34.3 mmHg — ABNORMAL LOW (ref 35.0–45.0)
pCO2 arterial: 34.6 mmHg — ABNORMAL LOW (ref 35.0–45.0)
pCO2 arterial: 39.6 mmHg (ref 35.0–45.0)
pCO2 arterial: 39.8 mmHg (ref 35.0–45.0)
pCO2 arterial: 40 mmHg (ref 35.0–45.0)
pH, Arterial: 7.444 — ABNORMAL HIGH (ref 7.350–7.400)
pH, Arterial: 7.472 — ABNORMAL HIGH (ref 7.350–7.400)
pH, Arterial: 7.492 — ABNORMAL HIGH (ref 7.350–7.400)
pH, Arterial: 7.42 — ABNORMAL HIGH (ref 7.350–7.400)
pH, Arterial: 7.438 — ABNORMAL HIGH (ref 7.350–7.400)
pH, Arterial: 7.446 — ABNORMAL HIGH (ref 7.350–7.400)
pH, Arterial: 7.45 — ABNORMAL HIGH (ref 7.350–7.400)
pH, Arterial: 7.467 — ABNORMAL HIGH (ref 7.350–7.400)
pO2, Arterial: 66 mmHg — ABNORMAL LOW (ref 80.0–100.0)
pO2, Arterial: 80 mmHg (ref 80.0–100.0)
pO2, Arterial: 91 mmHg (ref 80.0–100.0)
pO2, Arterial: 31 mmHg — CL (ref 80.0–100.0)
pO2, Arterial: 66 mmHg — ABNORMAL LOW (ref 80.0–100.0)
pO2, Arterial: 69 mmHg — ABNORMAL LOW (ref 80.0–100.0)
pO2, Arterial: 74 mmHg — ABNORMAL LOW (ref 80.0–100.0)
pO2, Arterial: 96 mmHg (ref 80.0–100.0)

## 2010-11-25 LAB — LIPID PANEL
Cholesterol: 136 mg/dL (ref 0–200)
HDL: 44 mg/dL (ref >39)
LDL Cholesterol: 77 mg/dL (ref 0–99)
Total CHOL/HDL Ratio: 3.1 RATIO
Triglycerides: 75 mg/dL (ref <150)
VLDL: 15 mg/dL (ref 0–40)

## 2010-11-25 LAB — CBC
HCT: 28.7 % — ABNORMAL LOW (ref 36.0–46.0)
HCT: 29.1 % — ABNORMAL LOW (ref 36.0–46.0)
HCT: 30.4 % — ABNORMAL LOW (ref 36.0–46.0)
HCT: 31.1 % — ABNORMAL LOW (ref 36.0–46.0)
HCT: 31.7 % — ABNORMAL LOW (ref 36.0–46.0)
HCT: 32.8 % — ABNORMAL LOW (ref 36.0–46.0)
HCT: 33.7 % — ABNORMAL LOW (ref 36.0–46.0)
HCT: 34.3 % — ABNORMAL LOW (ref 36.0–46.0)
HCT: 35.2 % — ABNORMAL LOW (ref 36.0–46.0)
HCT: 39.1 % (ref 36.0–46.0)
Hemoglobin: 10 g/dL — ABNORMAL LOW (ref 12.0–15.0)
Hemoglobin: 10.4 g/dL — ABNORMAL LOW (ref 12.0–15.0)
Hemoglobin: 10.5 g/dL — ABNORMAL LOW (ref 12.0–15.0)
Hemoglobin: 10.8 g/dL — ABNORMAL LOW (ref 12.0–15.0)
Hemoglobin: 9.7 g/dL — ABNORMAL LOW (ref 12.0–15.0)
Hemoglobin: 11.4 g/dL — ABNORMAL LOW (ref 12.0–15.0)
Hemoglobin: 11.5 g/dL — ABNORMAL LOW (ref 12.0–15.0)
Hemoglobin: 11.9 g/dL — ABNORMAL LOW (ref 12.0–15.0)
Hemoglobin: 12.1 g/dL (ref 12.0–15.0)
Hemoglobin: 13.7 g/dL (ref 12.0–15.0)
MCHC: 33.5 g/dL (ref 30.0–36.0)
MCHC: 33.7 g/dL (ref 30.0–36.0)
MCHC: 34 g/dL (ref 30.0–36.0)
MCHC: 34.3 g/dL (ref 30.0–36.0)
MCHC: 34.5 g/dL (ref 30.0–36.0)
MCHC: 34 g/dL (ref 30.0–36.0)
MCHC: 34.4 g/dL (ref 30.0–36.0)
MCHC: 34.7 g/dL (ref 30.0–36.0)
MCHC: 34.8 g/dL (ref 30.0–36.0)
MCHC: 34.9 g/dL (ref 30.0–36.0)
MCV: 105.7 fL — ABNORMAL HIGH (ref 78.0–100.0)
MCV: 105.9 fL — ABNORMAL HIGH (ref 78.0–100.0)
MCV: 106 fL — ABNORMAL HIGH (ref 78.0–100.0)
MCV: 106.4 fL — ABNORMAL HIGH (ref 78.0–100.0)
MCV: 106.6 fL — ABNORMAL HIGH (ref 78.0–100.0)
MCV: 105 fL — ABNORMAL HIGH (ref 78.0–100.0)
MCV: 105.8 fL — ABNORMAL HIGH (ref 78.0–100.0)
MCV: 106.3 fL — ABNORMAL HIGH (ref 78.0–100.0)
MCV: 106.6 fL — ABNORMAL HIGH (ref 78.0–100.0)
MCV: 106.6 fL — ABNORMAL HIGH (ref 78.0–100.0)
Platelets: 255 10*3/uL (ref 150–400)
Platelets: 259 10*3/uL (ref 150–400)
Platelets: 266 10*3/uL (ref 150–400)
Platelets: 300 10*3/uL (ref 150–400)
Platelets: 311 10*3/uL (ref 150–400)
Platelets: 132 10*3/uL — ABNORMAL LOW (ref 150–400)
Platelets: 133 10*3/uL — ABNORMAL LOW (ref 150–400)
Platelets: 143 10*3/uL — ABNORMAL LOW (ref 150–400)
Platelets: 153 10*3/uL (ref 150–400)
Platelets: 156 10*3/uL (ref 150–400)
RBC: 2.71 MIL/uL — ABNORMAL LOW (ref 3.87–5.11)
RBC: 2.75 MIL/uL — ABNORMAL LOW (ref 3.87–5.11)
RBC: 2.87 MIL/uL — ABNORMAL LOW (ref 3.87–5.11)
RBC: 2.92 MIL/uL — ABNORMAL LOW (ref 3.87–5.11)
RBC: 2.98 MIL/uL — ABNORMAL LOW (ref 3.87–5.11)
RBC: 3.1 MIL/uL — ABNORMAL LOW (ref 3.87–5.11)
RBC: 3.17 MIL/uL — ABNORMAL LOW (ref 3.87–5.11)
RBC: 3.22 MIL/uL — ABNORMAL LOW (ref 3.87–5.11)
RBC: 3.31 MIL/uL — ABNORMAL LOW (ref 3.87–5.11)
RBC: 3.73 MIL/uL — ABNORMAL LOW (ref 3.87–5.11)
RDW: 14.9 % (ref 11.5–15.5)
RDW: 15.2 % (ref 11.5–15.5)
RDW: 15.2 % (ref 11.5–15.5)
RDW: 15.3 % (ref 11.5–15.5)
RDW: 15.3 % (ref 11.5–15.5)
RDW: 14.9 % (ref 11.5–15.5)
RDW: 15 % (ref 11.5–15.5)
RDW: 15.1 % (ref 11.5–15.5)
RDW: 15.3 % (ref 11.5–15.5)
RDW: 15.8 % — ABNORMAL HIGH (ref 11.5–15.5)
WBC: 10.1 10*3/uL (ref 4.0–10.5)
WBC: 12.7 10*3/uL — ABNORMAL HIGH (ref 4.0–10.5)
WBC: 13.1 10*3/uL — ABNORMAL HIGH (ref 4.0–10.5)
WBC: 14.9 10*3/uL — ABNORMAL HIGH (ref 4.0–10.5)
WBC: 19.7 10*3/uL — ABNORMAL HIGH (ref 4.0–10.5)
WBC: 10.1 10*3/uL (ref 4.0–10.5)
WBC: 10.9 10*3/uL — ABNORMAL HIGH (ref 4.0–10.5)
WBC: 11.3 10*3/uL — ABNORMAL HIGH (ref 4.0–10.5)
WBC: 5.5 10*3/uL (ref 4.0–10.5)
WBC: 9 10*3/uL (ref 4.0–10.5)

## 2010-11-25 LAB — CARDIAC PANEL(CRET KIN+CKTOT+MB+TROPI)
CK, MB: 2.9 ng/mL (ref 0.3–4.0)
CK, MB: 3 ng/mL (ref 0.3–4.0)
CK, MB: 1.2 ng/mL (ref 0.3–4.0)
CK, MB: 1.2 ng/mL (ref 0.3–4.0)
CK, MB: 1.5 ng/mL (ref 0.3–4.0)
CK, MB: 1.7 ng/mL (ref 0.3–4.0)
CK, MB: 1.9 ng/mL (ref 0.3–4.0)
Relative Index: 1.8 (ref 0.0–2.5)
Relative Index: 2.8 — ABNORMAL HIGH (ref 0.0–2.5)
Relative Index: INVALID (ref 0.0–2.5)
Relative Index: INVALID (ref 0.0–2.5)
Relative Index: INVALID (ref 0.0–2.5)
Relative Index: INVALID (ref 0.0–2.5)
Relative Index: INVALID (ref 0.0–2.5)
Total CK: 107 U/L (ref 7–177)
Total CK: 162 U/L (ref 7–177)
Total CK: 31 U/L (ref 7–177)
Total CK: 34 U/L (ref 7–177)
Total CK: 35 U/L (ref 7–177)
Total CK: 38 U/L (ref 7–177)
Total CK: 70 U/L (ref 7–177)
Troponin I: <0.01 ng/mL (ref 0.00–0.06)
Troponin I: <0.01 ng/mL (ref 0.00–0.06)
Troponin I: 0.01 ng/mL (ref 0.00–0.06)
Troponin I: <0.01 ng/mL (ref 0.00–0.06)
Troponin I: <0.01 ng/mL (ref 0.00–0.06)
Troponin I: <0.01 ng/mL (ref 0.00–0.06)
Troponin I: <0.01 ng/mL (ref 0.00–0.06)

## 2010-11-25 LAB — COMPREHENSIVE METABOLIC PANEL
ALT: 43 U/L — ABNORMAL HIGH (ref 0–35)
ALT: 56 U/L — ABNORMAL HIGH (ref 0–35)
AST: 16 U/L (ref 0–37)
AST: 47 U/L — ABNORMAL HIGH (ref 0–37)
Albumin: 2.7 g/dL — ABNORMAL LOW (ref 3.5–5.2)
Albumin: 3.4 g/dL — ABNORMAL LOW (ref 3.5–5.2)
Alkaline Phosphatase: 89 U/L (ref 39–117)
Alkaline Phosphatase: 125 U/L — ABNORMAL HIGH (ref 39–117)
BUN: 35 mg/dL — ABNORMAL HIGH (ref 6–23)
BUN: 21 mg/dL (ref 6–23)
CO2: 27 mEq/L (ref 19–32)
CO2: 28 mEq/L (ref 19–32)
Calcium: 9.3 mg/dL (ref 8.4–10.5)
Calcium: 9.2 mg/dL (ref 8.4–10.5)
Chloride: 100 mEq/L (ref 96–112)
Chloride: 91 mEq/L — ABNORMAL LOW (ref 96–112)
Creatinine, Ser: 1.4 mg/dL — ABNORMAL HIGH (ref 0.4–1.2)
Creatinine, Ser: 1.22 mg/dL — ABNORMAL HIGH (ref 0.4–1.2)
GFR calc Af Amer: 47 mL/min — ABNORMAL LOW (ref >60)
GFR calc Af Amer: 55 mL/min — ABNORMAL LOW (ref >60)
GFR calc non Af Amer: 39 mL/min — ABNORMAL LOW (ref >60)
GFR calc non Af Amer: 45 mL/min — ABNORMAL LOW (ref >60)
Glucose, Bld: 397 mg/dL — ABNORMAL HIGH (ref 70–99)
Glucose, Bld: 572 mg/dL (ref 70–99)
Potassium: 3.1 mEq/L — ABNORMAL LOW (ref 3.5–5.1)
Potassium: 2.6 mEq/L — CL (ref 3.5–5.1)
Sodium: 139 mEq/L (ref 135–145)
Sodium: 133 mEq/L — ABNORMAL LOW (ref 135–145)
Total Bilirubin: 0.7 mg/dL (ref 0.3–1.2)
Total Bilirubin: 0.9 mg/dL (ref 0.3–1.2)
Total Protein: 6.3 g/dL (ref 6.0–8.3)
Total Protein: 6.2 g/dL (ref 6.0–8.3)

## 2010-11-25 LAB — PROTIME-INR
INR: 1.8 — ABNORMAL HIGH (ref 0.00–1.49)
INR: 2 — ABNORMAL HIGH (ref 0.00–1.49)
INR: 2.2 — ABNORMAL HIGH (ref 0.00–1.49)
INR: 2.2 — ABNORMAL HIGH (ref 0.00–1.49)
INR: 2.5 — ABNORMAL HIGH (ref 0.00–1.49)
INR: 2.6 — ABNORMAL HIGH (ref 0.00–1.49)
INR: 2.6 — ABNORMAL HIGH (ref 0.00–1.49)
INR: 0.9 (ref 0.00–1.49)
INR: 1.2 (ref 0.00–1.49)
INR: 2 — ABNORMAL HIGH (ref 0.00–1.49)
INR: 2.2 — ABNORMAL HIGH (ref 0.00–1.49)
INR: 2.5 — ABNORMAL HIGH (ref 0.00–1.49)
Prothrombin Time: 21.9 seconds — ABNORMAL HIGH (ref 11.6–15.2)
Prothrombin Time: 23.9 seconds — ABNORMAL HIGH (ref 11.6–15.2)
Prothrombin Time: 25.5 seconds — ABNORMAL HIGH (ref 11.6–15.2)
Prothrombin Time: 25.6 seconds — ABNORMAL HIGH (ref 11.6–15.2)
Prothrombin Time: 28.7 seconds — ABNORMAL HIGH (ref 11.6–15.2)
Prothrombin Time: 29.4 seconds — ABNORMAL HIGH (ref 11.6–15.2)
Prothrombin Time: 29.4 seconds — ABNORMAL HIGH (ref 11.6–15.2)
Prothrombin Time: 12.7 seconds (ref 11.6–15.2)
Prothrombin Time: 15.9 seconds — ABNORMAL HIGH (ref 11.6–15.2)
Prothrombin Time: 24 seconds — ABNORMAL HIGH (ref 11.6–15.2)
Prothrombin Time: 25.4 seconds — ABNORMAL HIGH (ref 11.6–15.2)
Prothrombin Time: 28.7 seconds — ABNORMAL HIGH (ref 11.6–15.2)

## 2010-11-25 LAB — APTT: aPTT: 29 seconds (ref 24–37)

## 2010-11-25 LAB — MAGNESIUM
Magnesium: 2.3 mg/dL (ref 1.5–2.5)
Magnesium: 2.7 mg/dL — ABNORMAL HIGH (ref 1.5–2.5)

## 2010-11-25 LAB — BRAIN NATRIURETIC PEPTIDE
Pro B Natriuretic peptide (BNP): 191 pg/mL — ABNORMAL HIGH (ref 0.0–100.0)
Pro B Natriuretic peptide (BNP): 244 pg/mL — ABNORMAL HIGH (ref 0.0–100.0)
Pro B Natriuretic peptide (BNP): 308 pg/mL — ABNORMAL HIGH (ref 0.0–100.0)
Pro B Natriuretic peptide (BNP): 52 pg/mL (ref 0.0–100.0)

## 2010-11-25 LAB — IRON AND TIBC
Iron: 25 ug/dL — ABNORMAL LOW (ref 42–135)
Saturation Ratios: 12 % — ABNORMAL LOW (ref 20–55)
TIBC: 206 ug/dL — ABNORMAL LOW (ref 250–470)
UIBC: 181 ug/dL

## 2010-11-25 LAB — URINALYSIS, ROUTINE W REFLEX MICROSCOPIC
Bilirubin Urine: NEGATIVE
Glucose, UA: >1000 mg/dL — AB
Hgb urine dipstick: NEGATIVE
Ketones, ur: NEGATIVE mg/dL
Leukocytes, UA: NEGATIVE
Nitrite: POSITIVE — AB
Protein, ur: NEGATIVE mg/dL
Specific Gravity, Urine: 1.014 (ref 1.005–1.030)
Urobilinogen, UA: 0.2 mg/dL (ref 0.0–1.0)
pH: 7 (ref 5.0–8.0)

## 2010-11-25 LAB — CULTURE, BLOOD (ROUTINE X 2)
Culture: NO GROWTH
Culture: NO GROWTH
Culture: NO GROWTH
Culture: NO GROWTH

## 2010-11-25 LAB — URINALYSIS, MICROSCOPIC ONLY
Bilirubin Urine: NEGATIVE
Glucose, UA: 500 mg/dL — AB
Ketones, ur: NEGATIVE mg/dL
Nitrite: NEGATIVE
Protein, ur: NEGATIVE mg/dL
Specific Gravity, Urine: 1.012 (ref 1.005–1.030)
Urobilinogen, UA: 0.2 mg/dL (ref 0.0–1.0)
pH: 5 (ref 5.0–8.0)

## 2010-11-25 LAB — POCT CARDIAC MARKERS
CKMB, poc: 1.1 ng/mL (ref 1.0–8.0)
CKMB, poc: 1.7 ng/mL (ref 1.0–8.0)
Myoglobin, poc: 130 ng/mL (ref 12–200)
Myoglobin, poc: 139 ng/mL (ref 12–200)
Troponin i, poc: <0.05 ng/mL (ref 0.00–0.09)
Troponin i, poc: <0.05 ng/mL (ref 0.00–0.09)

## 2010-11-25 LAB — DIFFERENTIAL
Basophils Absolute: 0 10*3/uL (ref 0.0–0.1)
Basophils Absolute: 0 10*3/uL (ref 0.0–0.1)
Basophils Relative: 0 % (ref 0–1)
Basophils Relative: 0 % (ref 0–1)
Eosinophils Absolute: 0.1 10*3/uL (ref 0.0–0.7)
Eosinophils Absolute: 0.1 10*3/uL (ref 0.0–0.7)
Eosinophils Relative: 1 % (ref 0–5)
Eosinophils Relative: 1 % (ref 0–5)
Lymphocytes Relative: 5 % — ABNORMAL LOW (ref 12–46)
Lymphocytes Relative: 6 % — ABNORMAL LOW (ref 12–46)
Lymphs Abs: 0.6 10*3/uL — ABNORMAL LOW (ref 0.7–4.0)
Lymphs Abs: 0.6 10*3/uL — ABNORMAL LOW (ref 0.7–4.0)
Monocytes Absolute: 1 10*3/uL (ref 0.1–1.0)
Monocytes Absolute: 0.4 10*3/uL (ref 0.1–1.0)
Monocytes Relative: 7 % (ref 3–12)
Monocytes Relative: 4 % (ref 3–12)
Neutro Abs: 11.4 10*3/uL — ABNORMAL HIGH (ref 1.7–7.7)
Neutro Abs: 9 10*3/uL — ABNORMAL HIGH (ref 1.7–7.7)
Neutrophils Relative %: 87 % — ABNORMAL HIGH (ref 43–77)
Neutrophils Relative %: 89 % — ABNORMAL HIGH (ref 43–77)

## 2010-11-25 LAB — PHOSPHORUS
Phosphorus: 3.1 mg/dL (ref 2.3–4.6)
Phosphorus: 3.2 mg/dL (ref 2.3–4.6)

## 2010-11-25 LAB — HEPARIN LEVEL (UNFRACTIONATED)
Heparin Unfractionated: 0.14 IU/mL — ABNORMAL LOW (ref 0.30–0.70)
Heparin Unfractionated: 0.26 IU/mL — ABNORMAL LOW (ref 0.30–0.70)
Heparin Unfractionated: 0.26 IU/mL — ABNORMAL LOW (ref 0.30–0.70)
Heparin Unfractionated: 0.47 IU/mL (ref 0.30–0.70)
Heparin Unfractionated: 0.47 IU/mL (ref 0.30–0.70)

## 2010-11-25 LAB — AMMONIA: Ammonia: 29 umol/L (ref 11–35)

## 2010-11-25 LAB — FERRITIN: Ferritin: 962 ng/mL — ABNORMAL HIGH (ref 10–291)

## 2010-11-25 LAB — CK TOTAL AND CKMB (NOT AT ARMC)
CK, MB: 2.7 ng/mL (ref 0.3–4.0)
Relative Index: 2.7 — ABNORMAL HIGH (ref 0.0–2.5)
Total CK: 100 U/L (ref 7–177)

## 2010-11-25 LAB — VITAMIN B12: Vitamin B-12: 854 pg/mL (ref 211–911)

## 2010-11-25 LAB — RETICULOCYTES
RBC.: 2.81 MIL/uL — ABNORMAL LOW (ref 3.87–5.11)
Retic Count, Absolute: 61.8 10*3/uL (ref 19.0–186.0)
Retic Ct Pct: 2.2 % (ref 0.4–3.1)

## 2010-11-25 LAB — TSH: TSH: 1.442 u[IU]/mL (ref 0.350–4.500)

## 2010-11-25 LAB — URINE MICROSCOPIC-ADD ON

## 2010-11-25 LAB — FOLATE: Folate: 18.7 ng/mL

## 2010-11-25 LAB — TROPONIN I: Troponin I: <0.01 ng/mL (ref 0.00–0.06)

## 2010-11-25 LAB — D-DIMER, QUANTITATIVE: D-Dimer, Quant: 0.5 ug/mL-FEU — ABNORMAL HIGH (ref 0.00–0.48)

## 2010-12-29 NOTE — Op Note (Signed)
[name], [name]              ACCOUNT NO.:  [id number]   MEDICAL RECORD NO.:  [id number]          PATIENT TYPE:  INP   LOCATION:  X007                         FACILITY:  [facility]   PHYSICIAN:  Pietro Cassis. Alvan Dame, M.D.  DATE OF BIRTH:  1951/02/06   DATE OF PROCEDURE:  05/09/2007  DATE OF DISCHARGE:                               OPERATIVE REPORT   PREOPERATIVE DIAGNOSIS:  End-stage right knee osteoarthritis in the  setting of rheumatoid arthritis, chronic prednisone use and on  methotrexate.   POSTOPERATIVE DIAGNOSIS:  End-stage right knee osteoarthritis in the  setting of rheumatoid arthritis, chronic prednisone use and on  methotrexate.   PROCEDURE:  Right total knee replacement.   COMPONENTS USED:  Right 2.5 femur TC3 with a 2.5 RP revision tray to  allow for 17.5 TC3 polyethylene insert rotating platform with 38  patellar button with autograft bone graft in patella due to large cyst.   SURGEON:  Pietro Cassis. Alvan Dame, M.D.   ASSISTANT:  Rowan Blase, PA.   ANESTHESIA:  Duramorph spinal.   DRAINS:  Times one.   TOURNIQUET TIME:  70 minutes 250 mmHg.   FINDINGS:  During the case it was noted that the patient's medial  collateral and medial retinacular tissues were very lax.  They were not  lacerated or cut during the case but were only lax and because of that I  chose to increase my inherent stability with a TC3 type component.  She  was a valgus knee, and even with increased polyethylene thickness and  maintaining ligament balance, there was still this discrepancy medially.   INDICATIONS FOR PROCEDURE:  [name] is a 60 year old female presented  to the office with a significant 8 to 10-year history of bilateral knee  pain progressing despite conservative attempts and measures.  She had a  history of rheumatoid arthritis on methotrexate and chronic prednisone  10 mg a day.  I reviewed with her increased risk of infection due to  medication use, DVT, component failure, need for  revision surgery, need  for future surgeries, consent was obtained.   PROCEDURE IN DETAIL:  The patient was brought to operative theater.  Once adequate anesthesia, preoperative antibiotics, clindamycin,  administered, the patient was positioned supine.  Examination indicated  that she had a fixed flexion valgus deformity combined about 15-20  degrees.  The left side seemed a little bit worse than the right, the  operative side today.  The right proximal thigh tourniquet was placed.  The right lower extremity pre scrubbed and prepped and draped in  standard fashion.  Her midline incision was made followed by median  parapatellar arthrotomy.  Following initial exposure, attention was  first directed to patella.  The patient's precut measurement was 22 mm.  I resected down to 14 mm.  This revealed a very large central cyst in  the patella which was visualized on the anterior radiographs of the  knee.  This was curetted out and irrigated and later would be packed  with bone graft prior to cementing.   Following patella cut and drill hole placement for 38 patellar button,  attention was now directed to the femur.  Femoral canal was opened with  a drill irrigated to prevent fat emboli.  Intramedullary rod was then  passed and at 5 degrees of valgus I chose to resect 11 mm of bone due to  her fixed flexion contracture.   I then sized the femur to be a size 2.5 and based on the posterior  condylar axis.  Noted a posterior condylar axis was a little bit more  neutral and internally rotated than Whiteside's line, so I added  external rotation by elevating the medial side.   The anterior-posterior chamfer cuts were all made without notching.   At this point the final lateral box cut was made for primary total knee  at this point.  At this point attention was directed to the tibia.  The  tibia was subluxated anteriorly.  With the extramedullary guide, I  initially resected 10 mm of bone off  the medial side of the tibia.  Following this cut, the knee was noted to come out to full extension at  least and this was the first time and at this point recognized the  laxity of the medial side following these bone cuts.   I was worried that this was not going to be stabilized with polyethylene  thickness and releases.  I subsequent made decision at this point to go  to [facility] components to provide more inherent mechanical stability with the  components allowing the medial side to contract with time.   I did do a trial reduction with a size 2.5 femur, 2.5 tibia and a 15-mm  insert and noted this opening medially.  All structures were palpably  intact.  There was no open defects in this tendon or the medial  collateral ligament region, just the lax tissues. At this point all  trial components removed.  Revision systems were brought into the room  sterilely without issue.  I then did basically the box cut for the TC3  component with the 2.5 femur.  Preparation of the tibia was then done  for the MBT revision tray.  Fortunately based my initial cut basically  just had to drill down further maintaining my perpendicular cut.  Trial  reduction was now carried out with a 2.5 TC3 femur, 2.5 MBT revision  tray and up to a 17.5 poly TC3 and this significantly improved the  stability of the knee from extension and flexion though there was still  palpable laxity medially and tightness laterally.  Given all these  parameters the trial components removed, final implants opened.  I  irrigated the knee out with pulse lavage, normal saline solution,  preparing the patella cyst.  I then injected the knee with 0.25%  Marcaine with epinephrine.  The final components were cemented in  position the tibia first then femur, 17.5 insert placed, knee brought  out to extension holding the neutral position to prevent having the  cement dry with the knee in valgus.  The patella was attended to.  Cut  surface  cancellous bone graft was then utilized bitten to small pieces  and packed into this cyst.  Cement was applied.  The final patellar  button placed.  Once cement had cured, the knee in the knee was examined  for loose fragments of cement or excessive extruded cement and this was  removed.  Once it was all removed, the final 17.5 x 2.5 TC3 RP poly was  placed.  5 mL of FloSeal was injected  in the posterior capsular tissue  after the tourniquet came down at 70 minutes.  The extensor mechanism  then reapproximated using #1 Vicryl.  Please note that based on similar  laxity on the medial side of the knee, I did choose to plicate this area  to try to tighten up the structures.  Again no defects were noted, just  lax tissues presumably due to the valgus nature of the knee and chronic  prednisone use.   Subcu area was closed with 2-0 Vicryl and a running 4-0 Monocryl.  Skin  was cleaned, dried and dressed sterilely with Steri-Strips, dressing  sponges, bulky wrap.  She is brought to recovery room in knee  immobilizer stable.      Pietro Cassis Alvan Dame, M.D.  Electronically Signed     MDO/MEDQ  D:  05/09/2007  T:  05/10/2007  Job:  [id number]

## 2010-12-29 NOTE — Consult Note (Signed)
NAMESHAMEIA, BURFIELD NO.:  [id number]   MEDICAL RECORD NO.:  [id number]          PATIENT TYPE:  EMS   LOCATION:  [facility]                         FACILITY:  [facility]   PHYSICIAN:  Satira Sark, MD DATE OF BIRTH:  1951/03/08   DATE OF CONSULTATION:  11/17/2008  DATE OF DISCHARGE:                                 CONSULTATION   REQUESTING PHYSICIAN:  Mylinda Latina, M.D.   REASON FOR CONSULTATION:  Chest pain.   HISTORY OF PRESENT ILLNESS:  Ms. Kiesow is a 60 year old woman with a  history of rheumatoid arthritis, diabetes mellitus, gastroesophageal  reflux disease, schizophrenia, chronic hypokalemia and asthma.  She  presented to the emergency department complaining of sudden onset  stabbing chest discomfort that began while she was slicing a piece of  cake earlier today.  She states that this pain also feels at times like  a heaviness and has persisted since its onset earlier this afternoon.  She feels short of breath with the discomfort and there is a definite,  pleuritic component causing her to take shallow breaths.  There has been  no specific radiation and she has had no associated nausea, emesis,  cough or hemoptysis.  She has been tachycardic during observation in the  emergency department and her electrocardiogram shows sinus tachycardia  at 117 beats per minute with unusual appearing ST elevation fairly  diffusely on baseline left anterior fascicular block and right bundle  branch block pattern.  The conduction delays are old compared to a  tracing from October 2008, although ST segment changes are new.  Her  cardiac markers at this point are entirely normal with a troponin-I  level less than 0.05.  Urinalysis suggests concurrent urinary tract  infection.  She also has severe hypokalemia with a potassium of 2.6 and  severe hyperglycemia with a glucose of 572.  Chest x-ray shows very low  lung volumes with cardiomegaly and relative increase in  mediastinal  size, although not commented on directly in the report.  She remains  uncomfortable in the emergency department although not hypoxic and a  followup tracing shows no new changes.  She has relatively poor R-wave  progression anteriorly.  An echocardiogram done back in 2005  demonstrated grossly normal left ventricular systolic function based on  limited views.   ALLERGIES:  ARE MULTIPLE AND INCLUDE DILANTIN AND VALIUM, ELAVIL,  ERYTHROMYCIN, PENICILLIN, CODEINE, ASPIRIN, HALDOL, LIBRIUM, THORAZINE  AND NAPROSYN.   MEDICATIONS:  Are legion and include:  1. Prempro 0.3-1.5 mg daily.  2. Spironolactone 50 mg daily.  3. Lexapro 10 mg daily.  4. Metformin 500 mg p.o. b.i.d.  5. Propoxyphene N 100 every 6 hours.  6. Colchicine 0.6 mg p.o. b.i.d.  7. Diovan 40 mg daily.  8. Lasix 80 mg p.o. b.i.d.  9. Clonidine 0.4 mg p.o. nightly.  10.Oxybutynin 5 mg p.o. b.i.d.  11.Hydrochlorothiazide 25 mg p.o. b.i.d.  12.Nexium 40 mg p.o. daily.  13.Simvastatin 10 mg p.o. daily.  14.Doxepin 125 mg p.o. nightly.  15.Glipizide ER 10 mg p.o. daily.  16.Ranitidine 300 mg p.o. nightly.  17.Potassium chloride 20 mEq  2 tablets p.o. t.i.d.  18.Albuterol inhalers every 4 hours.  19.Benzonatate 100 mg p.o. nightly.  20.Allopurinol 300 mg p.o. daily.  21.Methotrexate 2.5 mg 5 tablets on Saturday and Sunday.  22.Amitriptyline 25 mg p.o. nightly.  23.Advair 250/50 one puff b.i.d.   PAST MEDICAL HISTORY:  Outlined above and in addition includes right  total knee replacement.  She does have chronic anemia as well.   SOCIAL HISTORY:  The patient lives with her mother.  There is no active  tobacco or alcohol use history.   FAMILY HISTORY:  Reviewed and significant for diabetes mellitus.   REVIEW OF SYSTEMS:  Outlined above.  The patient denies any recent  fevers or chills.  No cough, hemoptysis.  No exertional chest pain.  She  has chronic leg pain and stasis changes.  No recent palpitations  or  syncope.  Appetite has been stable.  Otherwise reviewed and negative.   EXAMINATION:  Heart rate is 110 in sinus rhythm.  Oxygen saturation in  the mid 90s two liters nasal cannula.  Systolic blood pressure 0000000,  respirations 26.  This is a chronically ill appearing obese woman, short of breath at rest  complaining of moderate intensity chest pain, sharp and with a pleuritic  character.  HEENT:  Conjunctiva, lids normal.  Pharynx clear with poor dentition.  NECK:  Increased girth.  Unable to assess total venous pressure.  No  obvious loud bruits.  No thyromegaly.  LUNGS:  Exhibit diminished breath sounds throughout, particularly at the  bases.  Some egophony in the mid lung zones.  CARDIAC:  Exam reveals distant regular heart sounds.  PMI is indistinct.  No obvious pericardial rub is elicited either supine or seated.  ABDOMEN:  Obese.  Unable to palpate liver edge.  Bowel sounds present.  EXTREMITIES:  Exhibit chronic-appearing weeping stasis changes and  chronic 2+ edema.  Distal pulses are diminished.  There is erythema on  the lower legs anteriorly.  Dressed ulceration is noted as well.  Mild  scoliosis is noted.  NEURO:PSYCHIATRIC:  Patient is alert and oriented x3.   LABORATORY DATA:  Blood gas:  pH 7.45, pCO2 - 39, pO2 - 74, bicarb 27.  WBCs 10.1, hemoglobin 13.7, hematocrit 39.1, platelets 156, INR 0.9.  Sodium 133, potassium 2.6, chloride 91, bicarb 28, glucose 572, BUN 21,  creatinine 1.2.  CK-MB 1.7 down to 1.1, troponin-I less than 0.05.  BNP  is 52.  Urinalysis:  Cloudy with greater than 1000 glucose, positive  nitrites, many bacteria.  Culture pending.   IMPRESSION:  1. Presentation with chest pain syndrome, sudden onset and associated      with normal cardiac markers, but significantly abnormal      electrocardiogram as described.  She has a chronic right bundle      branch block and left anterior fascicular block with fairly diffuse      ST segment changes.   Could consider the possibility of      pericarditis/effusion, although she does not have a pericardial rub      on examination.  Injury current is possible, but markers are      negative so far.  Pain is somewhat pleuritic and the possibility of      a pulmonary embolus should also be considered.  2. History of rheumatoid arthritis and previous right knee surgery.  3. Schizophrenia  4. Known reflux disease.  5. Severe hypokalemia with baseline history of chronic hypokalemia and      substantial  potassium supplementation as an outpatient.  This may      also be impacting her electrocardiogram.  6. Severe hyperglycemia with baseline history of diabetes mellitus,      recently started on insulin.   RECOMMENDATIONS:  At this point plans are underway for the patient to be  admitted to the medicine service.  We are arranging a contrasted CT scan  of the chest while the patient is in the emergency department to better  assess the mediastinum, evaluate the pericardium and also exclude  pulmonary embolus.  A stat echocardiogram is also being considered.  Would aggressively replete potassium and manage hyperglycemia.  I  suspect that she will at least need to be in the step-down unit.  At  this point would not anticoagulate until further information is  available.  Continue to cycle cardiac markers.  Our service will follow  with you.      Satira Sark, MD  Electronically Signed     SGM/MEDQ  D:  11/17/2008  T:  11/17/2008  Job:  [id number]   cc:   Katy Apo, M.D.

## 2010-12-29 NOTE — Discharge Summary (Signed)
[name], [name]              ACCOUNT NO.:  [id number]   MEDICAL RECORD NO.:  [id number]          PATIENT TYPE:  INP   LOCATION:  5156                         FACILITY:  [facility]   PHYSICIAN:  Jamal Collin. Hensel, M.D.DATE OF BIRTH:  [birth year]   DATE OF ADMISSION:  11/29/2008  DATE OF DISCHARGE:  12/05/2008                               DISCHARGE SUMMARY   PRIMARY CARE PHYSICIAN:  Dixon Boos, MD in [facility].   DISCHARGE DIAGNOSES:  1. Respiratory distress.  2. Congestive heart failure.  3. Asthma exacerbation.  4. Obesity.  5. Hypoventilation syndrome.  6. Tachycardia.  7. Recent history of pulmonary embolism, therapeutic on Coumadin.  8. Hypertension.  9. Hyperlipidemia.  10.Acute on chronic renal failure.  11.Leukocytosis.  12.Urinary tract infection.  13.Cellulitis of the right leg.  14.Diabetes mellitus type 2.  15.Gout.  16.Osteoarthritis.  17.Rheumatoid arthritis.  18.Peripheral neuropathy.  19.Bladder spasm.  20.Schizophrenia.  21.Depression.  22.Gastroesophageal reflux disease with Barrett esophagus.   DISCHARGE MEDICATIONS:  1. Advair discus 500/50 one puff twice daily.  2. Albuterol 90 mcg 1 puff 4 times daily.  3. Allopurinol 300 mg twice daily.  4. Colchicine 0.6 mg twice daily.  5. Darvocet 100/650 every 6 hours as needed for pain.  6. Valsartan 40 mg twice daily.  7. Doxepin 125 mg at bed time.  8. Glipizide 10 mg daily.  9. Lexapro 10 mg daily.  10.Metformin 500 mg twice daily.  11.Ranitidine 300 mg daily.  12.Spironolactone 15 mg daily.  13.Simvastatin 10 mg daily.  14.Nexium 40 mg p.o. twice daily.  15.Oxybutynin 5 mg twice daily.  16.Lyrica 120 mg 3 times daily.  17.Lantus 30 units at bedtime.  18.Folic acid 1 mg daily.  19.Methotrexate 2.5 mg twice daily.  20.Furosemide 40 mg twice daily.  21.Potassium chloride 20 mEq twice daily.  22.Coumadin to take 2.5 mg on Sunday, Tuesday, Wednesday, and Friday      and 5  mg on Monday, Thursday, and Saturday.  Recheck INR on Friday,      December 07, 2008 in the [facility] for further      instructions.   The patient was instructed not to take clonidine or hydrochlorothiazide  until Dr. Ronnald Ramp says to take them.  She was instructed to use her CPAP  machine at night and to call the family practice clinic if she showed  signs of fluid overload, which is swelling, trouble breathing, or cough.  She was notified that she was taking a decreased amount of her Lasix and  potassium.   PROCEDURES:  1. Chest x-ray on December 01, 2008.  Impression:  Low lung film with marked enlargement of pericardial  silhouette and pulmonary vascular congestion.   LABORATORY DATA:  Actually echo on April 19, [id number], systolic function was  normal.  EF in the range of 55-60% mitral valve was mildly calcified  annulus.   RESULTS:  Blood cultures x2 negative.   LABORATORY DATA UPON DISCHARGE:  Sodium 143, potassium 3.6, chloride  107, CO2 30, and glucose 146.  BUN 19, creatinine 0.96, calcium 8.6,  PT  21.9, INR 1.8, and BNP 308.  Urine culture was positive for Klebsiella  pneumonia, sensitive to Ciprofloxacin.  White blood cell count 10.1,  hemoglobin 10.4, hematocrit 31.1, and platelets 259.   BRIEF HOSPITAL COURSE:  [name] is a pleasant 60 year old with  multiple medical problems that was admitted for respiratory distress.  1. Respiratory:  The patient's respiratory distress was secondary to      CHF and asthma exacerbation as well as obesity hypoventilation      syndrome.  She was originally treated with albuterol and Atrovent      nebs, steroids, oxygen for her asthma exacerbation, and Lasix for      diuresis and for her CHF exacerbation.  She diuresed well over the      first few days of her hospital stay and her respiratory distress      resolved fairly quickly.  She was transitioned to her p.o. home      meds easily.  The patient was satting well on  room air during the      day, but suggested that the patient has a CPAP at home per night.      She was referred to sleep study for outpatient evaluation for her      CPAP.  2. Cardiovascular:  The patient had another echo on the December 02, 2008      that showed normal systolic function with an EF 55-60%.  The      patient does have a history of fluid overload and her home dose of      Lasix is normally 80 mg p.o. b.i.d.  After the diuresis for the      first several days of hospitalization, the patient's Lasix was      decreased and she was beginning to become dehydrated and have some      electrolyte abnormalities.  The patient did well on a lower dose of      Lasix and she was discharged on the lower dose of 40 mg twice      daily.  She was given strict instructions to call the clinic, if      she has any signs of fluid overloading.  Overload including      swelling, trouble breathing, or cough.  The patient has a history      of recent PE and this maintained therapeutic INR and Coumadin.  She      was discharged with the above regimen and instructed to have her      INR rechecked on Friday, December 07, 2008 in the clinic.  The patient      has a past medical history of hypertension for which she takes      several medicines.  During the stay, her medications of clonidine      and hydrochlorothiazide were not needed and were held.  These may      be added back by her primary care physician.  The patient's      hyperlipidemia was controlled with Zocor.  3. Renal:  The patient has a past medical history of chronic renal      failure with experiencing acute on chronic issues during this stay      that resolved.  Her creatinine trend is as follows 1.1-1.23-1.37-      0.95-0.96.  Her electrolytes remained stable.  4. Hematology:  The patient has a history of chronic anemia.  Her      hemoglobin was maintained  around 10 throughout her stay.  The      patient also was found to have  leukocytosis.  This was thought to      be secondary to her Klebsiella UTI and steroids.  5. Endocrine:  The patient with history of diabetes mellitus type 2.      She was transitioned to her home regimen of Lantus 30, glipizide,      and metformin prior to discharge.  Her blood glucose did remain      elevated secondary to steroids.  6. Infectious Disease:  The patient was found to have a Klebsiella UTI      and was treated with Cipro, and has chronic cellulitis of her right      leg that are remained stable.  Wound Care was consulted and had no      new recommendations at this time.  7. Orthopedics:  The patient has a history of gout, osteoarthritis,      and rheumatoid arthritis.  Her home medications were maintained.  8. Neurology:  The patient has a history of peripheral neuropathy.      Her home medication of Lyrica was continued.  9. Urology:  The patient has past medical history of bladder spasm.      Her home medication of oxybutynin was maintained.  10.Psychiatry:  The patient has past medical history of schizophrenia      and depression.  Her home medications were maintained and she was      given Ativan p.r.n. for anxiety.  11.Gastroenterology:  The patient has past medical history of GERD      with Barrett esophagus.  Her home medications were maintained.  12.Follow up:  The patient was instructed to follow up with her      primary care physician Dr. Tonia [facility].  She was given referral for CPAP machine at night and as      well as split night study for her own CPAP.  She was given      instructions to decrease her amount of Lasix and potassium with red      flags for seeking medical care and signs of fluid overload.  She      was instructed not to take on her clonidine  or hydrochlorothiazide      until Dr. Ronnald Ramp says to restart.  The patient was also given      instructions and she had a diabetic diet and instructions on wound      care  and change in the dressing on her right leg and keeping the      area clean and dry.      Briscoe Deutscher, MD  Electronically Signed      Jamal Collin. Andria Frames, M.D.  Electronically Signed    EW/MEDQ  D:  12/10/2008  T:  12/11/2008  Job:  [id number]

## 2010-12-29 NOTE — Discharge Summary (Signed)
[name], [name]              ACCOUNT NO.:  [id number]   MEDICAL RECORD NO.:  [id number]          PATIENT TYPE:  INP   LOCATION:  [place]                         FACILITY:  [facility]   PHYSICIAN:  Pietro Cassis. Alvan Dame, M.D.  DATE OF BIRTH:  [birth year]   DATE OF ADMISSION:  07/11/2007  DATE OF DISCHARGE:  07/15/2007                               DISCHARGE SUMMARY   ADMITTING DIAGNOSES:  1. Rheumatoid arthritis.  2. History of right total knee replacement.  3. Diabetes.  4. Schizophrenia.  5. Reflux disease.  6. Asthma.  7. Chronic hypokalemia.   DISCHARGE DIAGNOSES:  1. Rheumatoid arthritis.  2. Status post left total knee replacement.  3. Right total knee replacement.  4. Diabetes.  5. Schizophrenia.  6. Reflux.  7. Asthma.  8. Chronic hypokalemia.  9. Acute blood loss anemia.   CONSULTANTS:  None.   PROCEDURE:  A left total knee replacement by surgeon Dr. Paralee Cancel.  Assistant Rowan Blase, PA-C.   HISTORY OF PRESENT ILLNESS:  A 60 year old female with a history of left  knee pain secondary to rheumatoid arthritis.  It was refractory to all  conservative treatment.  Does have a history of right total knee  replacement and has done very well.  Presurgical assessment by Dr.  Hurley Cisco.   LABS ON ADMISSION:  CBC; hematocrit 30.8.  Did drop down to 23.5.  Was  transfused 2 units.  Her hematocrit came back up and was stable at 32.1  at the time of discharge.  Coag's negative.  Routine chemistry at  admission; her potassium was 3.1, glucose 167, creatinine 1.24. Tracked  through her course of stay.  At discharge her potassium was stable at 3,  glucose 159, her creatinine was back down to 0.86.  Preadmission GFR was  45, calcium 9.8 upon admission.  GFR came back up to greater than 60.  Her calcium was 8.8.  GI:  ALT was 41, all others normal.  UA showed  positive leukoesterase, many bacteria, hyaline casts.   Cardiology:  EKG showed sinus tachycardia with cardiac  clearance.  No  chest x-ray found in chart.   HOSPITAL COURSE:  The patient admitted to the orthopedic floor for left  total knee replacement.  She had no complaints.  She remained afebrile  throughout her course of stay.  Hemovac was pulled.  She was  neurovascularly intact of her left lower extremity throughout.  PT, OT  weightbearing as tolerated was started.  With her chronic hypokalemia we  did increase her K-Dur during the course of stay.  We held her blood  pressure medicines with systolic less than [id number].  Orthopedically she did  very well.  Ambulated quite well and was able to ambulate over 400 feet  prior to discharge.  By day three was hemodynamically and orthopedically  stable and ready for discharge home.   DISCHARGE DISPOSITION:  Discharged home with home health care, PT and  Lovenox teaching kit.   DISCHARGE DIET:  Regular diabetic.   DISCHARGE WOUND CARE:  Keep dry.   DISCHARGE MEDICATIONS:  1. Lovenox 30 mg subcu q.24h.  times 14 days.  2. Enteric-coated aspirin 325 mg one p.o. daily after Lovenox      completed.  3. Robaxin 500 mg p.o. q.6h.  4. Iron 325 mg one p.o. t.i.d. x2 weeks.  5. Colace 100 mg p.o. b.i.d.  6. MiraLAX 17 grams p.o. daily.  7. Vicodin 5/325 one to two p.o. q.4-6h. p.r.n. pain.  8. K-Dur 80 mEq p.o. t.i.d. x2 weeks and then return to previous dose.   HOME MEDICATIONS:  1. Prempro 0.3-1.5 mg 1 tablet daily.  2. Spironolactone 50 mg one p.o. daily.  3. Lexapro 10 mg p.o. daily.  4. Metformin 500 mg one p.o. b.i.d.  5. Darvocet p.r.n.  6. Colchicine 0.6 mg one p.o. b.i.d.  7. Diovan 40 mg one p.o. q.a.m.  8. Furosemide 80 mg one p.o. b.i.d.  9. Clonidine 0.2 mg 2 tablets at bedtime.  10.Oxybutynin 5 mg one p.o. b.i.d.  11.HCTZ 25 mg one p.o. b.i.d.  12.Nexium 40 mg one p.o. b.i.d.  13.Simvastatin 10 mg one p.o. daily.  14.Doxepin 100 mg one p.o. q.h.s.  15.Doxepin 25 mg one p.o. nightly  16.Glipizide ER 10 mg one p.o. daily.   17.Ranitidine 300 mg one p.o. q.h.s.  18.Potassium chloride; regular dose is 20 mEq 2 tablets three times a      day.  19.Albuterol 90 mcg inhaler 1 puff four times a day.  20.Benzonatate 100  mg p.o. t.i.d.  21.Allopurinol 300 mg one p.o. daily.  22.Methotrexate 2.5 mg 5 tablets on Saturday, 5 tablets on Sunday.  23.Amitriptyline 25 mg one p.o. q.h.s.  24.Advair 250/50 micrograms 1 puff two times daily.   DISCHARGE PHYSICAL THERAPY:  Weightbearing as tolerated with the use of  a rolling walker for two weeks and then transition to single point cane.  Goal is range of motion 0-100 at two weeks, 0-120 at six weeks.     ______________________________  Carlean Jews Collene Mares, [place]      Pietro Cassis. Alvan Dame, M.D.  Electronically Signed    BLM/MEDQ  D:  08/21/2007  T:  08/21/2007  Job:  [id number]   cc:   Lindaann Slough, M.D.  [place]  [place] [address]

## 2010-12-29 NOTE — Op Note (Signed)
[name], [name]              ACCOUNT NO.:  [id number]   MEDICAL RECORD NO.:  [id number]          PATIENT TYPE:  INP   LOCATION:  X002                         FACILITY:  [facility]   PHYSICIAN:  Pietro Cassis. Alvan Dame, M.D.  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   POSTOPERATIVE DIAGNOSIS:  Left knee endstage osteoarthritis versus  degenerative joint disease as a result of history of rheumatoid  arthritis and arthritis with a history of right total knee replacement  doing very well.   PROCEDURE:  Left total knee replacement.   COMPONENTS USED:  DePuy knee system with a size 2.5 TC3 femur, a size  2.5 MBT revision tray without stem, a 15 mm TC3 insert and a 38 patella  button.   SURGEON:  Pietro Cassis. Alvan Dame, M.D.   ASSISTANT:  Carlean Jews. Collene Mares, PA-C.   ANESTHESIA:  Spinal anesthesia.   BLOOD LOSS:  None.   COMPLICATIONS:  None.   DRAINS:  One.   TOURNIQUET TIME:  60 minutes at 250 mmHg.   INDICATIONS FOR PROCEDURE:  [name] is a 60 year old female with  endstage bilateral knee degenerative joint disease with a history right  total knee replacement doing very well.  This is a planned staged left  knee replacement.  I reviewed the risks and benefits and consent was  obtained.   PROCEDURE IN DETAIL:  The patient was brought to the operating theater.  Once adequate anesthesia, preoperative antibiotics, Ancef, administered  the patient was positioned supine.  Left proximal thigh tourniquet was  placed.  The left lower extremity was then pre scrubbed and prepped and  draped in sterile fashion.  A slightly curved lateral midline incision  was made to account for the severe  valgus deformity that was present.  The median arthrotomy was carried out with minimal dissection off the  medial proximal tibia.  Following initial exposure, I addressed the  patella first which was noted to have severe osteophytes.  Following  debridement, the precut measurement was 22 mm.  I resected down to 14 mm  and used a 38 patellar button as I had on the contralateral knee.   Due to the extensor ligament laxity, the plan was to perform TC3  component and MBT revision tibial component based on what I had used on  the contralateral.  Knee exposure femoral drill was utilized open the  femoral canal, irrigated the canal to prevent fat emboli.  Then the  intramedullary rod was passed and at 5 degrees of valgus I resected 10  mm of bone off the distal medial femur.  This amounted to basically a  shim cut off the distal and lateral femur.  No augment was necessary.   I then sized to the femur to be a size 2 and 1/2 as I had on  the other  side.  Based on the hypoplastic lateral femur, the native femur was  internally rotated.  I then externally rotated the cutting block to  match the perpendicular to [employer].  I dropped the lateral side  posteriorly making sure and confirmed that I was not in the notch.  The  anterior and posterior chamfer cuts were all made with no notching.  Then based off the lateral side of the distal femur, I did my TC3 box  cut without complication.  At this point, I attended to the tibia with  removal of the meniscus and the stumps of the cruciate ligaments.   With the tibia subluxated anteriorly, an extramedullary guide was placed  bringing it out from the tibia at 2 degrees.   With the guide pinned in position, I resected 6 mm based off the lateral  side based on radiographs which appeared to have some proximal tibial  tibia vara.  This cut was made.  Initially I had some difficulty placing  my block and I felt this was more due to the fact that I had  not cut the  appropriate slope and despite cutting through the slot, I did recut and  I did cut through the next pin was up removing 10 mm of bone, but I did  focus on the slope of the cut.  With this cut, I was able to at least  get the 10 mm insert in place.  At this point she was still tight  laterally.   After this cut and confirming that I came out to at least extension, I  removed the pins.  I used the laminar spreaders and removed significant  osteophytes out of the back of the knee.  I also did some release of the  iliotibial band off the proximal tibia just around the proximal lateral  aspect of.  At this point final preparation of the tibia was carried  out.  Using the size 2 and 1/2 tibial tray, I drilled and keel punched  without complication.  Trial reduction was carried out with the 2 and  1/2 tray keeled and the 2 and 1/2 TC3 femur.  I tried a 12.5 poly and  the knee came out straight.  The ligaments actually at this point  appeared to be fairly balanced with a little bit of opening medially.  I  then did trial 15 insert and was happy the knee came out to extension  and the ligaments appeared to be balanced.  The patella tracked through  the trochlea without application or pressure.  At this point, the final  components were opened and trial components removed.  The knee was  irrigated with normal saline solution with pulse lavage removing bone  debris.  There was no palpable bone remaining.  I then injected the  synovial capsule layer with 60 mL of 0.25% Marcaine with epinephrine and  1 mL of Toradol.  Cement was mixed.  These two batches were reduced  standard doing a 2 and 1/2 size.  I did create a bone plug with the box  cut and impacted it to the appropriate depth in tibia with act as a  cement restricter.  The tibial was cemented into position followed by  the femur and then the 15 non TC3 posted component placed and the knee  brought out to extension.  Extruded  cement was removed and once the  cement had cured, I checked the back of the knee and  removed excessive  cement.  Once this was removed, the final 15 x 2.5 TC3 component was  inserted with the tibia subluxated anteriorly.  At this point the knee  was reirrigated and 5 mL of FloSeal injected in the posterior, medial  and lateral aspects of the knee.  At this point a medium Hemovac drain  was placed deep.  The tourniquet was let down at 60 minutes.  The  extensor mechanism was then reapproximated with #1 Vicryl with the knee  in the flexion.  The remaining wound was closed in layers with 2-0  Vicryl on the subcu layer and 4-0 running Monocryl.  The knee was  cleaned, dried, and dressed sterilely with Steri-Strips and a bulky  sterile dressing.  She was then brought to recovery in stable condition.      Pietro Cassis Alvan Dame, M.D.  Electronically Signed     MDO/MEDQ  D:  07/11/2007  T:  07/11/2007  Job:  [id number]

## 2010-12-29 NOTE — H&P (Signed)
[name], [name]              ACCOUNT NO.:  [id number]   MEDICAL RECORD NO.:  [id number]          PATIENT TYPE:  INP   LOCATION:  NA                           FACILITY:  [facility]   PHYSICIAN:  Pietro Cassis. Alvan Dame, M.D.  DATE OF BIRTH:  1951/07/06   DATE OF ADMISSION:  05/09/2007  DATE OF DISCHARGE:                              HISTORY & PHYSICAL   PROCEDURE:  Right total knee arthroplasty.   CHIEF COMPLAINT:  Right knee pain.   HISTORY OF PRESENT ILLNESS:  [name] is a 60 year old female with a  history of persistent progressive right knee pain secondary to  rheumatoid arthritis.  It has been refractory to all conservative  treatments.  She has been followed closely in the past by Dr. Charlestine Night.  She will be presurgical assessed before surgery.   PAST MEDICAL HISTORY:  1. Rheumatoid arthritis.  2. Diabetes.  3. Schizophrenia.  4. Reflux disease.  5. Asthma.   PAST SURGICAL HISTORY:  None.   FAMILY HISTORY:  Diabetes, arthritis.   SOCIAL HISTORY:  Primary caregiver after surgery will be [name].   DRUG ALLERGIES:  1. VALIUM.  2. THORAZINE.  3. LIBRIUM.  4. HALDOL.  5. PENICILLIN.  6. ERYTHROMYCIN.  7. SULFA.  8. ASPIRIN.  9. ADVIL.  10.DILANTIN.  11.CODEINE.   MEDICATIONS:  1. Methotrexate 2.5 mg 3 tablets twice a day on Saturdays.  2. HCTZ 25 mg 1 tablet b.i.d.  3. Oxybutynin 5 mg 1 p.o. b.i.d.  4. Furosemide 80 mg 1 p.o. b.i.d.  5. Darvocet 1 p.o. q.6 h. p.r.n.  6. Potassium chloride 20 mEq 2 t.i.d.  7. Prilosec 20 mg 1 p.o. b.i.d.  8. Lexapro 10 mg 1 p.o. daily.  9. Doxepin HCl 25 mg 1 at bedtime.  10.Doxepin HCl 100 mg 1 at bedtime.  11.Zocor 10 mg 1 p.o. at bedtime.  12.Clonidine 0.2 mg 2 at bedtime.  13.Ranitidine 300 mg 1 at bedtime.  14.Spironolactone 50 mg 1 tab daily.  15.Prempro 0.3-1.5 mg 1 tablet a day.  16.Amitriptyline 25 mg 1 tablet at bedtime.  17.Glipizide ER 10 mg 1 tablet a day.  18.Prednisone 10 mg 1 tablet a day.  19.Albuterol 90 mcg inhaler, use as directed.  20.Advair 250/50 mcg, 1 puff twice a day.  21.Over-the-counter vitamin C daily.  22.Calcium 600 mg daily.  23.Multivitamin daily.   REVIEW OF SYSTEMS:  SKIN:  Bilateral cellulitis lower extremities.  She  will be treated with Keflex prior to surgery from May 02, 2007  through May 09, 2007.  Otherwise, see HPI.   PHYSICAL EXAMINATION:  VITAL SIGNS:  Pulse 84, respirations 18, blood  pressure 108/68.  GENERAL:  Awake, alert, and oriented, well developed, well nourished, in  no acute distress.  NECK:  Supple.  No carotid bruits.  CHEST:  Lungs clear to auscultation bilaterally.  BREASTS:  Deferred.  HEART:  Regular rate and rhythm, without gallops, clicks, rubs. or  murmurs.  ABDOMEN:  Soft, nontender, nondistended.  Bowel sounds present.  GENITOURINARY:  Deferred.  EXTREMITIES:  Right lower extremity has dorsalis pedis pulse positive.  She does  have flexion contracture. Flexes with pain as well.  SKIN:  Bilateral cellulitic changes, lower extremities.  NEUROLOGIC:  Intact distal sensibilities.   LABORATORY DATA:  EKG and chest x-ray pending.  May 02, 2007  testing.   IMPRESSION:  1. Rheumatoid arthritis.  2. Diabetes.  3. Schizophrenia.  4. Reflux.  5. Asthma.   PLAN OF ACTION:  Right total knee arthroplasty.  Risks and complications  were discussed.  Questions encouraged, answered, and reviewed.   Postoperative medications, including Lovenox, Robaxin, over-the-counter  iron, Colace, and MiraLax provided at time of history and physical.  Pain medicines will be prescribed at time of surgery.     ______________________________  [name], [place]      Pietro Cassis. Alvan Dame, M.D.  Electronically Signed    BLM/MEDQ  D:  05/01/2007  T:  05/01/2007  Job:  [id number]   cc:   [name], M.D.  [place]  [place] [address]

## 2010-12-29 NOTE — Procedures (Signed)
EEG NUMBER:  [id number]   HISTORY:  This is a 60 year old patient with decreased responsiveness,  altered mental status.  The patient has a history of schizophrenia.  The  patient is being evaluated for the episode of confusion.  This is a  routine EEG.  No skull defects noted.   MEDICATIONS:  Include allopurinol, Zocor, Advair, Avapro, Catapres,  Lexapro, Glucophage, Provera, Sinequan, Cipro and albuterol.   EEG CLASSIFICATION:  Normal awake and asleep.   DESCRIPTION:  The background rhythm of this recording consists of a  fairly well-modulated medium amplitude alpha rhythm of 10 Hz that is  reactive to eye opening and closure.  As the record progresses, the  patient appears to remain in the waking state initially during the  recording.  Photic stimulation is performed resulting in excellent and  bilaterally symmetric photic drive response.  Hyperventilation was not  performed.  As the record progresses, the patient enters the drowsy  state with a dropout of the background rhythm activities and eventually  enters stage II sleep with vertex sharp wave activity and rudimentary  sleep spindles seen.  The patient is alert towards the end of the  recording.  At no time during the recording, does there appear to be  evidence of spike or spike wave discharges or evidence of focal slowing.  EKG monitor shows no evidence of cardiac rhythm abnormalities with a  heart rate of 96.   IMPRESSION:  This is a normal EEG recording, awake and sleeping state.  No evidence of ictal or interictal discharges were seen.      Jill Alexanders, M.D.  Electronically Signed     [id number]  D:  05/22/2007 13:43:02  T:  05/22/2007 18:30:31  Job #:  [id number]

## 2010-12-29 NOTE — Discharge Summary (Signed)
[name], [name]              ACCOUNT NO.:  [id number]   MEDICAL RECORD NO.:  [id number]          PATIENT TYPE:  INP   LOCATION:  4704                         FACILITY:  [facility]   PHYSICIAN:  Charles Mix A. Walker Kehr, M.D.    DATE OF BIRTH:  10-13-1950   DATE OF ADMISSION:  12/19/2008  DATE OF DISCHARGE:  12/24/2008                               DISCHARGE SUMMARY   PRIMARY CARE PHYSICIAN:  Dixon Boos, MD, at the [facility].   DISCHARGE DIAGNOSES:  1. Left pleural effusion.  2. Pericarditis/pericardial effusion.  3. Hypertension.  4. Diabetes.  5. Congestive heart failure with ejection fraction of 55%.  6. Hypokalemia.  7. History of pulmonary emboli in April 2010.  8. Gastroesophageal reflux disease.  9. Hyperlipidemia.  10.Schizophrenia.  11.Depression.   DISCHARGE MEDICATIONS:  1. Doxepin 125 mg subcu p.o. at bedtime.  2. Lexapro 10 mg p.o. daily.  3. Lasix 80 mg p.o. b.i.d.  4. K-Dur 20 mEq tablet, take 3 tablets twice a day.  5. Simvastatin 10 mg p.o. at bedtime.  6. Spironolactone 50 mg p.o. daily.  7. Coumadin 5 mg p.o. daily.  8. Aspirin 650 mg every 8 hours until you see Dr. Aundra Dubin.  9. Advair 500/50 one puff 2 times a day.  10.Lyrica 100 mg p.o. 3 times daily.  11.Albuterol 90 mcg 1 puff every 6 hours as needed.  12.Haloperidol 300 mg 2 tabs daily.  13.Colchicine 0.6 mg 2 times daily.  14.Darvocet 100/650 mg 1 tablet every 6 hours as needed for pain.  15.Epogen as needed.  16.Glipizide 10 mg p.o. daily.  17.Metformin 500 mg p.o. twice daily.  18.Ranitidine 300 mg daily.  19.Nexium 40 mg p.o. twice daily.  20.Calcium 500 mg p.o. 3 times daily.  21.Methotrexate 2.5 mg as directed by Dr. Charlestine Night.  22.Lantus 30 units subcu at night.  23.Oxybutynin 1.5 mg 2 tabs a day.  24.Folic acid 1 mg p.o. daily while on methotrexate.   CONSULTS:  Cardiology was consulted and Dr. Aundra Dubin saw the patient.   PROCEDURES:  1. A 2-D cardiac echo on Dec 20, 2008, that showed  estimated ejection      fraction of 55%.  There is mid to apical posterior wall      hypokinesis.  2. Chest x-ray on Dec 19, 2008, that showed a new moderate left pleural      effusion.  Increased diffuse interstitial infiltrate/edema.  Stable      cardiomegaly and low lung volume.  CT angiogram of the chest on Dec 20, 2008, showed residual emboli.  The remaining pulmonary artery      branches are pain.  New moderate left pleural effusion with left      lower lobe collapse and compressive atelectasis in the lingula and      upper lobe.  Stable to mildly increased moderate pericardial      effusion.  Right pulmonary atelectasis.  3. Left decubitus chest x-ray on Dec 20, 2008, that showed freely      layering left effusion.  4. Chest x-ray on Dec 24, 2008,  that showed no change in left pleural      effusion.   LABORATORY DATA:  1. Blood culture no growth final.  2. BMET that showed a potassium of 3.0, glucose of 223, otherwise BMET      within normal limits.  3. INR of 2.2.  PT 25.9.  4. CBC that showed a hemoglobin of 9.1 and hematocrit of 26.3.  5. ANA was negative.  6. Magnesium level 1.8.  7. Urine culture with no growth final.  8. ESR of 108.  9. LDH of 194.  10.Serial cardiac enzymes were negative.  11.Lipid profile with triglyceride of 104, HDL of 28, LDL of 76.   BRIEF HOSPITAL COURSE:  This is a 60 year old female who was admitted  for complaint of shortness of breath and chest pain.  1. Chest pain.  Chest pain was atypical in nature.  Serial cardiac      enzymes.  Cardiology was consulted to see the patient when the CT      angiogram showed that there was pericardial effusion.  Dr. Aundra Dubin      saw the patient and began treating her for pericarditis-type chest      pain.  He also ordered an 2-D echo with the results read in the      previous.  He started treating her with aspirin 325 mg x6 hours      which was subsequently increased to 650 mg p.o. q.6 hours.  He       ordered a sed rate which resulted as above.  He was concerned for      collagen vascular disease related to pericarditis and pleuritis,      but ANA studies have been negative.  The patient is to continue      home on aspirin 650 mg q.6 hours as needed for the pain and will      follow up with Dr. Aundra Dubin on an outpatient basis.  2. Shortness of breath.  Shortness of breath was most likely secondary      to pericardial effusion seen on chest x-ray and CT angiogram.      Interventional Radiology was consulted for therapeutic      thoracentesis to relieve symptoms.  IR wanted the patient's INR      level to be less than 2 to decrease the chance of bleeding before      they can proceed with the procedure.  Coumadin was discontinued      during this hospitalization, and the patient was placed on heparin      so that we can get her INR to a more acceptable level for      Interventional Radiology.  The patient remained here in stable      condition with decreased chest pain and shortness of breath.  A      repeat chest x-ray was ordered on Dec 24, 2008, to evaluate her      pleural effusion.  That chest x-ray showed that there has been no      change to her pleural effusion, and it was thought that the pleural      effusion may be most likely secondary to the pulmonary emboli that      she is experiencing the months previously.  The mode of treatment      for this was corticosteroids.  Dr. Aundra Dubin was once again consulted      regarding treatment of the pleural effusion with corticosteroid.  He recommended that we do not start the patient on prednisone since      it will increase her chance of having her pleural effusions in the      future.  Since the pleural effusion has not increased in size and      the patient is asymptomatic and not requiring any oxygen      supplement, we have decided to discharge the patient home and to      monitor her on an outpatient basis.  The patient was very  agreeable      to this recommendation.  3. Hypertension.  Benicar was held during this hospitalization since      the patient had some blood pressures in the 90s to the low 100.      The patient has been doing well with Benicar being held with her      blood pressure being systolically in the [id number] prior to      discharge.  4. Diabetes.  The patient's home dose of Lantus was 30 units subcu at      bedtime.  We placed the patient on a sliding scale insulin and      Lantus during this hospitalization and made adjustments to her      Lantus dose as needed.  She will be sent home with her home dose of      Lantus 30 units subcu at bedtime, although during this      hospitalization she has required as much as 40 units of Lantus.  5. CHF with ejection fraction of 55%.  The patient is to continue on      her home dose of Lasix 80 mg p.o. b.i.d.  When she was first      admitted, she appeared to be a little volume overload and was given      a 40 mg IV dose of Lasix.  Subsequently, she was able to put out 2      L of urine output.  The patient was then transitioned to her home      dose of Lasix.  6. Hypokalemia.  The patient was admitted to the hospital with a      potassium level of 2.7.  The patient was repleted multiple times      during this hospitalization with potassium 40 mEq and her potassium      level never reach more than 3.3.  A magnesium level was ordered and      was found to be within normal limits at 1.8.  The patient home dose      of spironolactone 25 mg should have helped her to maintain her      normal potassium level but that did not help.  The patient will be      discharged home with an increased dose of spironolactone 50 mg p.o.      daily and a daily dose of K-Dur 60 mEq twice daily.  7. History of pulmonary emboli.  The patient had a recent pulmonary      emboli in April 2010.  The patient was continued on Coumadin during      this hospitalization until it was  determined that Interventional      Radiology would do a therapeutic thoracentesis for pleural      effusion.  She was then discontinued on Coumadin and daily INRs      were followed.  On day of discharge, her INR was therapeutic at 2.5  and the patient was discharged home to resume her Coumadin without      the requirement of bridging with Lovenox since she was therapeutic.  8. GERD.  The patient to continue on her home dose of Pepcid.  9. Hyperlipidemia.  The patient to continue on her home dose of      simvastatin 10 mg p.o. at bedtime given that her LDL during this      hospitalization was at goal at 76.  10.Schizophrenia.  The patient on home medicine.  11.Depression.  The patient on home medicine   DISCHARGE INSTRUCTIONS:  The patient is discharged home with no  restrictions on activity.  Diet:  Low-sodium, heart healthy diabetic  diet.  Wound care:  Not applicable.   FOLLOW UP ISSUES:  Patient should have a repeat Echo in one month to  evaluate myocardial wall motion.   FOLLOWUP APPOINTMENTS:  1. The patient has an appointment at lab at the [facility]      on Friday, Dec 27, 2008, at 9:15 for a PT/INR check.  2. The patient has an appointment with Dr. Erin Hearing at the [facility] on Jan 02, 2009, at 9:45.  3. The patient has an appointment with Dr. Aundra Dubin at the [facility] on Jan 06, 2009, at 12:15.      Merla Riches, MD  Electronically [place] A. Walker Kehr, M.D.  Electronically Signed    CT/MEDQ  D:  12/27/2008  T:  12/28/2008  Job:  [id number]   cc:   Loralie Champagne, MD

## 2010-12-29 NOTE — Consult Note (Signed)
NAMESOCORRA, BALTES NO.:  [id number]   MEDICAL RECORD NO.:  [id number]          PATIENT TYPE:  INP   LOCATION:  6705                         FACILITY:  [facility]   PHYSICIAN:  Felizardo Hoffmann, M.D.  DATE OF BIRTH:  1951/07/03   DATE OF CONSULTATION:  05/22/2007  DATE OF DISCHARGE:                                 CONSULTATION   [name] continues to experience derogatory auditory hallucinations.  Her mood is depressed.  She is very anxious, having insomnia.   She is maintaining normal orientation and memory ability.  She is  cooperative with bedside care.   REVIEW OF SYSTEMS:  HEMATOLOGIC/LYMPHATICS:  WBC 6.7, hemoglobin 10.6,  platelet count 297.  GASTROINTESTINAL:  SGOT is 30, SGPT is 57.  PSYCHIATRIC:  The patient is having intrusive recollections of trauma.   PHYSICAL EXAMINATION:  VITAL SIGNS:  Temperature 97.4, pulse 96,  respiratory rate 15, blood pressure 150/82, O2 saturation on room air is  97%.   MENTAL STATUS EXAM:  [name] is alert.  She is oriented to all  spheres.  Her affect is very anxious.  Her mood is depressed.  Her  memory is intact to immediate, recent, and remote.  Speech is within  normal limits.  Thought process is coherent.  Thought content:  Please  see the history of present illness.  Insight is intact to knowing that  this set of symptoms is due to a mental illness.  The patient's judgment  is impaired for self-care outside the hospital; however, her judgment is  intact for the capacity for informed consent regarding psychiatric care.   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   Depressive disorder, not otherwise specified.   RECOMMENDATIONS:  1. Would continue to __________ .  2. Continue Zyprexa 5 mg daily for antipsychosis and increase by 2.5      mg per day as tolerated to 10 mg daily.  Would check a QTC while      increasing the Zyprexa.  If greater than 500 milliseconds,  would      discontinue the Zyprexa.  3. Continue the Lexapro trial at 10 mg daily for antidepressant and      anxiety.  4. Would admit to a psychiatric unit once medically cleared.      Felizardo Hoffmann, M.D.  Electronically Signed     JW/MEDQ  D:  05/23/2007  T:  05/23/2007  Job:  [id number]

## 2010-12-29 NOTE — H&P (Signed)
[name], [name] NO.:  [id number]   MEDICAL RECORD NO.:  [id number]          PATIENT TYPE:  INP   LOCATION:  2107                         FACILITY:  [facility]   PHYSICIAN:  Dickie La, MD        DATE OF BIRTH:  12/29/1950   DATE OF ADMISSION:  11/17/2008  DATE OF DISCHARGE:                              HISTORY & PHYSICAL   CHIEF COMPLAINT:  Chest pain.   PRIMARY CARE PHYSICIAN:  Dixon Boos, MD at the [facility].   HISTORY OF PRESENT ILLNESS:  This is a 60 year old female presenting  with chest pain.  She had sudden onset of severe chest pain that she  describes alternatively as pressure versus sharp pain.  It is associated  with shortness of breath and pain that is worse with breathing.  She is  unable to draw deep breath secondary to this pain.  She can identify no  exacerbating or alleviating factors.  Her chest x-ray shows a wide  mediastinum and point-of-care cardiac enzymes are negative, though the  EKG is equivocal.  Cardiology has been consulted and will do a stat CT  angiogram and echocardiogram.  The patient was also noted incidentally  to have a blood glucose in excess of 500.  She endorses polyuria and  polydipsia for several weeks.  She denies fevers, but states she has a  lower leg infection from a dog scratch in January 2010, which is still  oozing and painful.  She takes Lantus and oral hypoglycemics as  prescribed and gets assistance from her mother and niece who help with  her medications.   REVIEW OF SYSTEMS:  Per HPI.   PAST MEDICAL HISTORY:  1. Schizophrenia.  2. Diabetes mellitus type 2 with peripheral neuropathy.  3. Hypertension.  4. Hyperlipidemia.  5. Hypokalemia.  6. Gout.  7. Depression.  8. Asthma.  9. Chronic venous insufficiency.  10.Allergic rhinitis.  11.GERD.  12.Osteoarthritis.  13.Obesity.  14.Urge incontinence.  15.Barrett esophagus, EGD in August 2007, which is to be repeated in 2-    3 years.  16.Nephrolithiasis in 2003.  17.PFTs in 2008 showed mild obstructive airway disease.  18.Amenorrhea since age 48.  19.Questionable CVA at age 61.   PAST SURGICAL HISTORY:  1. Appendectomy, August 28, 2001.  2. Cholecystectomy, August 28, 2001.  3. Bladder stretching, August 28, 2001.  4. Bilateral knee replacement in 2008.   FAMILY HISTORY:  Father deceased from black lung disease.  Mother with  bone cancer.  Brother with lung cancer.  Brother with blood cancer.  Sister deceased from colon cancer.  There is a strong family history of  diabetes mellitus, coronary artery disease, and kidney disease.   SOCIAL HISTORY:  She is divorced and has no children.  She is disabled  secondary to schizophrenia, her niece Helene Kelp and her adoptive mother  helped care for her.  She has no alcohol or illicit drug use.  She  stopped smoking in 1973.   ALLERGIES:  1. VALIUM.  2. THORAZINE.  3. LIBRIUM.  4. HALDOL.  5. PENICILLIN.  6. ERYTHROMYCIN.  7. SULFA DRUGS.  8. ASPIRIN.  9. ADVIL.  10.DILANTIN.  11.CODEINE.   MEDICATIONS:  1. Aspirin 81 mg p.o. daily.  2. Advair Diskus (25/50) one puff inhaled b.i.d.  3. Albuterol 90 mcg inhaler, 1 puff inhaled q.i.d.  4. Allopurinol 300 mg p.o. b.i.d.  5. Colchicine 0.6 mg p.o. b.i.d.  6. Amitriptyline 25 mg p.o. nightly.  7. Darvocet 100/650 one tab by mouth q.6 h. p.r.n. pain.  8. Clonidine 0.4 mg p.o. nightly.  9. Valsartan 40 mg p.o. daily.  10.Hydrochlorothiazide 25 mg p.o. b.i.d.  11.Furosemide 80 mg p.o. daily.  12.Spironolactone 50 mg p.o. daily.  13.Potassium chloride 60 mg p.o. b.i.d. and 40 mEq p.o. daily.  14.Doxepin 100 mg p.o. nightly.  15.Lexapro 10 mg p.o. daily.  16.Prempro (0.3/1.5) one tab p.o. daily.  17.Simvastatin 10 mg p.o. daily.  18.Ranitidine 300 mg p.o. daily.  19.Nexium 40 mg p.o. b.i.d.  20.Calcium supplement daily x3.  21.Methotrexate as directed by her rheumatologist.  22.Oxybutynin 5 mg p.o.  b.i.d.  23.Prednisone 5 mg p.o. daily.  24.Lyrica 100 mg p.o. t.i.d.  25.Glipizide 10 mg p.o. daily.  26.Metformin 500 mg p.o. b.i.d.  27.Lantus 1 dose daily as directed, not to exceed 20 units daily.   PHYSICAL EXAMINATION:  VITAL SIGNS:  Temperature is 98.4, heart rate 115-  118, respirations 28-30, blood pressure 125/76, oxygen saturation 92-94%  on 2 L of oxygen by nasal cannula.  GENERAL:  She is alert and oriented, obese, but uncomfortable appearing.  HEENT:  Dry mucous membranes.  CARDIAC:  Tachycardic with a regular rhythm with no murmurs, rubs, or  gallops.  PULMONARY:  Splinting with poor respiratory effort.  No crackles.  ABDOMEN:  Soft, nontender, obese.  EXTREMITIES:  Trace edema bilateral, tender right lower extremity in the  region of her wound.  SKIN:  Right lower extremity with a 7 cm open superficial wound draining  pus.  PSYCH:  No apparent delusions or hallucinations.  Appropriate affect.  Anxious.   LABS AND STUDIES:  Sodium 133, potassium 2.6, chloride 91, bicarb 28,  BUN 21, creatinine 1.22, glucose 572, calcium 9.2.  Liver function  tests, total bilirubin 0.9, alkaline phosphatase 125, AST 47, ALT 56,  total protein 6.2, albumin 2.4.  CBC, white count 10.1, hemoglobin 13.7,  hematocrit 39.1, platelets 156, BNP 52.  Point-of-care cardiac enzymes  negative.  Coags, PT at 12.7, INR 0.9, PTT 29.  Urinalysis, yellow,  cloudy with a specific gravity 1.014, pH is 7.0, glucose greater than  1000, bilirubin negative, ketone negative, blood negative, protein  negative, urobilinogen negative, nitrite positive, leukocytes negative.  Urine micro shows 0-2 white blood cells with many bacteria.  EKG shows a  right bundle branch block and left anterior fascicular block, ST changes  are equivocal.  Portable chest x-ray shows prominent markings  particularly at the left lung base which may reflect scarring as well as  stable cardiomegaly.  Arterial blood gas, pH 7.450, PCO2  of 39.6, PO2 of  74.0, bicarb 27.5, oxygen saturation 95%.   ASSESSMENT:  This is a 60 year old female with an extensive past medical  history who presents with chest pain and hyperglycemia.  1. Chest pain.  Appreciate cardiology consult.  Negative point-of-care      cardiac enzymes and equivocal EKG.  The patient is with widened      mediastinum, tachycardia, and tachypnea.  This is concerning for      pulmonary embolism, aortic aneurysm, aortic dissection.  A  stat CT      angiogram and echocardiogram are pending at this time.  We will      follow cardiology recommendations regarding her chest pain.  2. Hyperglycemia, possibly secondary to infection or cardiac issues.      No ketones or acidosis.  Hold her home medications of glipizide,      metformin, and Lantus.  She was given insulin 10 units x1 in the      emergency department and was placed on the glucose stabilizer      protocol in the emergency department.  We will continue to follow      potassium closely while getting insulin.  3. Hypokalemia.  This is a chronic problem for the patient.  Potassium      was repleted in the emergency room with potassium chloride 40 mEq      p.o. x2.  We will continue her home potassium supplement and follow      closely as insulin is given for her hyperglycemia.  4. Cellulitis of the right lower leg.  Nonhealing from a dog's scratch      3 months ago.  Previously, treated as an outpatient with      doxycycline.  We will treat during this hospitalization with IV      clindamycin.  5. Hypertension.  Stable at this point.  We will continue her home      clonidine, valsartan, hydrochlorothiazide, and spironolactone.  We      will hold her home furosemide while giving IV fluids.  6. Hyperlipidemia.  Her last LDL was 96 in July 2009.  We will      continue her home simvastatin.  7. Gout.  We will continue her home allopurinol and colchicine.  8. Schizophrenia and depression.  Stable at this time.   No      antipsychotic medicines are noted on her home medication list.  We      will continue her home amitriptyline, doxepin, and Lexapro.  9. Asthma.  She is having difficulty breathing at this time, though it      is likely secondary to cardiac issues.  We will continue her home      Advair and albuterol.  10.Gastroesophageal reflux disease with Barrett esophagus.  We will      treat with pantoprazole 40 mg p.o. b.i.d.  11.Osteoarthritis.  Continue the home Darvocet.  12.Rheumatoid arthritis.  Continue home prednisone and clarify dosing      of the methotrexate.  13.Menopausal symptoms.  We will hold the patient's Prempro during      this admission.  14.Peripheral neuropathy.  We will continue the patient's home Lyrica.  15.Bladder spasm.  Continue her home oxybutynin.  16.FEN/GI.  Carbohydrate modified diet.  IV fluids per the glucose      stabilization protocol.  We will likely need several liters of      fluid.  17.Prophylaxis.  We will initiate heparin after the CT angiogram.  18.Disposition, pending stabilization.  Once she is stable, she will      likely return to home where she is cared for by her mother and      niece.      Graciella Belton, MD  Electronically Signed      Dickie La, MD  Electronically Signed    MO/MEDQ  D:  11/17/2008  T:  11/18/2008  Job:  [id number]

## 2010-12-29 NOTE — Discharge Summary (Signed)
NAMEARASELY, FELDER              ACCOUNT NO.:  [id number]   MEDICAL RECORD NO.:  [id number]          PATIENT TYPE:  IPS   LOCATION:  0300                          [facility]ACILITY:  [facility]   PHYSICIAN:  Stark Jock, M.D. DATE OF BIRTH:  July 18, 1951   DATE OF ADMISSION:  05/24/2007  DATE OF DISCHARGE:  05/25/2007                               DISCHARGE SUMMARY   IDENTIFYING INFORMATION:  This a 60 year old white female who is single.  This is a voluntary admission.   HISTORY OF PRESENT ILLNESS:  First [facility] admission for this 60 year old  white female with a history of schizophrenia who had been admitted to  the medical unit on May 17, 2007 with some mental status changes.  She has a history of schizophrenia and had a total right knee  replacement on May 09, 2007 by Dr. Alvan Dame.  She had reported having  some hallucinations and had some intermittent unresponsiveness according  to the family.  During her stay there, Dr. Felizardo Hoffmann was consulted  for psychiatry and her symptoms were ultimately attributed to an acute  psychotic episode.  She was stabilized with a discontinuation of her  amitriptyline, decrease in her doxepin from 125 mg per day to 50 mg  daily and Zyprexa 5 mg daily was added.  She was also found to have a  UTI which was treated with ciprofloxacin.   PAST PSYCHIATRIC HISTORY:  Remarkable for a history of schizophrenia  which had remained stable under the management of her primary care  practitioner.  Has not seen a psychiatrist in many years.  Does have a  history of two prior admissions in the distant past and had been stable  for many years on her current medications including the doxepin 125 mg  daily.  She has no history of substance abuse.   SOCIAL HISTORY:  This is a single white female who is currently living  with her niece, on multiple medications.  Niece doses the medications  and a sister with all ADLs.  She has a basic education and no legal   problems.   MEDICAL HISTORY:  The patient is a 60 year old female with most recent  history of total right knee replacement on May 09, 2007, psychosis  NOS developing about two weeks prior to admission, urinary tract  infection, gastroesophageal reflux disease, gout, diabetes mellitus type  2, hyperlipidemia and history of arthritis.  Primary care practitioner  is Dr. Dixon Boos at the [facility] and Dr. Alvan Dame,  her orthopedist.   MEDICATIONS:  Medications, at the time of transfer to the psychiatric  unit, include losartan 25 mg daily, allopurinol 300 mg daily, MVI 1  tablet daily, calcium carbonate 500 mg once a day, simvastatin 10 mg  daily, aspirin 81 mg daily, prednisone 10 mg daily, pantoprazole 40 mg  p.o. b.i.d., Advair 1 puff b.i.d., clonidine 0.4 mg p.o. nightly,  glipizide 10 mg daily, escitalopram 10 mg daily, metformin 500 mg p.o.  b.i.d., conjugated estrogens 0.3 mg p.o. daily and medroxyprogesterone  acetate 1.25 mg p.o. daily, doxepin 50 mg p.o. nightly, decreased from  125 mg daily and acetaminophen 650 mg p.o. q.6h. p.r.n. for pain,  albuterol 2 puffs q.6h. p.r.n.  It is also noted from the family that  the patient was previously on Darvocet for about a year and a half and,  prior to her medical admission, this was discontinued and she was  treated with another opiate of which the family is unable to specify.  Also the medical unit has noted that the patient did have a significant  reaction to MORPHINE and they had requested caution with any further use  of opioids in the patient.   LABORATORY DATA:  Diagnostic studies are detailed in the medical  dictation.  Please refer to the dictation by Dr. Read Drivers.  We note  that TSH was 1.593 on May 17, 2007 and we did not repeat this.  Her  hemoglobin A1C is 6.6.   MENTAL STATUS EXAM:  This fully alert female, pleasant, cooperative with  a sense of humor and full affect who is in bed  reclining, complaining of  some pain in her right leg where she had the knee replacement and is  requesting to take some ibuprofen for this.  Hygiene is good and dress  is appropriate.  Affect is full.  She is pleasant, cooperative, fully  engaged in conversation.  Speech is normal in pace, tone and production,  articulate, relevant.  Mood is euthymic.  She has talked about some  recent losses that she has had including death of her pet cats which she  was quite attached to and several relatives in the last year.  She  herself feels that her previous mental status changes may have been due  to medication reaction but admits that in the past she has had  hallucinations that just come over me suddenly and she is unable to  attribute them to any particular stressor.  Today, she is fully engaged  in conversation.  Cognition is fully intact.  She is oriented to all  spheres.  Comprehension and expression are completely within normal  limits.  No suicidal thoughts.  No evidence of hallucinations or  internal distractions.  She is requesting to go home and is willing to  continue outpatient therapy.   ADMISSION DIAGNOSES:  AXIS I:  Psychosis not otherwise specified.  Rule  out medication-induced delirium.  History of schizophrenia.  AXIS II:  Deferred.  AXIS III:  Status post right total knee replacement on May 09, 2007, diabetes mellitus type 2, urinary tract infection and other  diagnoses per the medical discharge summary.  AXIS IV:  Deferred.  AXIS V:  Current 59; past year 69, estimated.   HOSPITAL COURSE:  The patient was on our unit approximately 24 hours.  During that time was interacting appropriately with staff.  Medications  were not changed.  Her doxepin was continued at 50 mg at bedtime.  Her  Darvocet and opiates had been stopped and we had cautioned her against  resuming any of this until she met with Dr. Alvan Dame for a follow-up  appointment.  Because she was continuing to  deny any suicidal or  homicidal thoughts, was in full contact with reality, her family was  requesting that we allow her to come home and she herself was requesting  to go home. we made a decision to discharge her today after a conference  with her niece in which we reviewed medication changes and the niece  having no concern.  We decided to discharge her.  DISCHARGE DIAGNOSES:  AXIS I:  Rule out medication-induced delirium.  Schizophrenia not otherwise specified.  AXIS II:  Deferred.  AXIS III:  Medical diagnoses as noted above.  AXIS IV:  Deferred.  AXIS V:  Current 61; past year 83.   The patient is discharged today.   DISCHARGE MEDICATIONS:  Discharge medications are unchanged from the  admitting medications.      Margaret A. Nicki Reaper, N.P.      Stark Jock, M.D.  Electronically Signed   MAS/MEDQ  D:  05/26/2007  T:  05/26/2007  Job:  [id number]

## 2010-12-29 NOTE — Discharge Summary (Signed)
[name], [name]              ACCOUNT NO.:  [id number]   MEDICAL RECORD NO.:  [id number]          PATIENT TYPE:  INP   LOCATION:  2609                         FACILITY:  [facility]   PHYSICIAN:  Blane Ohara McDiarmid, M.D.DATE OF BIRTH:  [birth year]   DATE OF ADMISSION:  11/17/2008  DATE OF DISCHARGE:  11/21/2008                               DISCHARGE SUMMARY   DISCHARGE DIAGNOSES:  1. Pulmonary embolism.  2. Diabetes mellitus type 2.  3. Right lower extremity cellulitis.  4. Klebsiella urine colonization.  5. Asthma.  6. Rheumatoid arthritis.  7. Hypokalemia.  8. Schizophrenia.  9. Depression.  10.Gastroesophageal reflux disease/Barrett's esophagus.  11.Peripheral neuropathy.  12.Gout.  13.Hyperlipidemia.  14.Bladder spasm.   DISCHARGE MEDICATIONS:  1. Spironolactone 50 mg p.o. daily  2. Lexapro 10 mg p.o. daily  3. Metformin 500 mg p.o. b.i.d.  4. Lantus 25 units subcu q.h.s.  5. Advair 500/50 inhaled 1 puff b.i.d.  6. Albuterol 1 puff q. q.i.d. p.r.n. wheezing.  7. Allopurinol 150 mg p.o. daily.  8. Darvocet 100/650 q.6 hours p.r.n. pain.  9. Clonidine 0.1 mg p.o. b.i.d.  10.Valsartan 40 mg p.o. daily.  11.Lasix 80 mg p.o. daily.  12.Potassium chloride 40 mEq p.o. b.i.d.  13.Doxepin 100 mg p.o. q.h.s.  14.Simvastatin 10 mg p.o. daily  15.Ranitidine 300 mg p.o. daily  16.Nexium 40 mg p.o. b.i.d.  17.Calcium supplement p.o. t.i.d.  18.Methotrexate as directed by rheumatology  19.Oxybutynin 5 mg p.o. b.i.d.  20.Prednisone 5 mg p.o. daily  21.Lyrica 100 mg p.o. t.i.d.  22.Glipizide 10 mg p.o. daily.  23.Coumadin 3 mg on day one post discharge and then as directed.  24.Folic acid 1 mg p.o. daily.  25.Clindamycin 450 mg p.o. t.i.d. times 5 days.   DISCONTINUED MEDICATIONS:  1. Colchicine  2. Amitriptyline.  3. HCTZ.  4. Prempro.   CONSULTANTS:  Cardiology.   LABORATORY DATA:  Labs labs on admission were as follows:  BMET:  Sodium  133, potassium 2.6, chloride  91, bicarb 28, BUN 21, creatinine 1.22,  glucose 572, calcium 9.2.  LFTs:  T bili 0.9, alkaline phosphatase 125,  AST 47, ALT 56, total protein 6.2, albumin 2.4.  CBC:  White blood cells  10.1, hemoglobin 13.7, hematocrit 39.1, platelets 156.  BNP 58.  Point  of care cardiac enzymes negative times one.  INR 0.9.  Urinalysis:  More  than 1000 glucose, nitrite positive, leukocyte esterase negative.  Urine  micro:  Many bacteria, 0-2 white blood cells.  EKG with right bundle  branch block and left anterior fascicular block.  Portable chest x-ray  with prominent markings, particularly at left lung base, stable  cardiomegaly.  Note that the patient's INR on discharge was 2.2 and had  trended up to a high of 2.5.  The patient had been therapeutic on INR  for 3 days prior to discharge.  Point of care cardiac enzymes were  negative x2 sets.  The patient's creatinine trended from 1.41 to 0.73 on  discharge.  The patient's urine culture showed Klebsiella sensitive to  cefazolin, ceftriaxone, Cipro, gent, levo, tobra, TMP/S and B.  The  patient's blood cultures were negative times 5 days.   STUDIES:  Portable chest x-ray on November 17, 2008 showed prominent  markings, particularly at the left lung base, may well reflect scarring  as noted in complete report, stable cardiomegaly.  CT angiogram chest  showed pulmonary embolus with clot identified in left upper lobe  pulmonary artery.  Fatty infiltration of liver.  Small pericardial  effusion.  Chest x-ray on November 18, 2008 showed low lung volumes and  cardiac enlargement, question of minimal right basilar atelectasis.  CT  head without contrast on November 19, 2008 showed mild atrophy and small  vessel disease.  No acute intracranial findings.  A 2-D echo from November 18, 2008 showed left ventricular systolic function 0000000 inadequate for  regional wall motion abnormality.  Aortic valve mildly calcified.  Mild  mitral annular calcification.  Peak right  ventricular systolic pressure  within the limits of normal.   BRIEF HOSPITAL COURSE:  This is a 60 year old female with multiple  medical issues who presented with pulmonary embolism, hyperglycemia, and  right lower extremity cellulitis.  1. Pulmonary embolism.  The patient was with CT angiogram as above.      The patient was started on a heparin drip and Coumadin.  Source was      felt to be related to possible trauma to the right leg with right      leg cellulitis.  The patient was anticoagulated with heparin and      Coumadin as above.  The patient's INR was therapeutic for 3 days on      Coumadin with a 5-day course of heparin prior to discharge.  The      patient was stable from a pulmonary standpoint on discharge.  The      patient had been admitted to ICU, however, again as above improved      from a pulmonary standpoint.  2. Diabetes mellitus type 2.  The patient presented with hyperglycemia      to 500 on BMET. The patient was started on an insulin drip which      was initially discontinued with normalization of blood sugars and      the patient was started on sliding scale insulin.  However, the      patient's blood sugars then increased after steroid dosing for      asthma exacerbation during the course of her hospitalization and      the patient was restarted on an insulin drip.  The patient was      titrated off of insulin drip again and started on sliding scale      resistant, as well as meal coverage and basal Lantus.  The patient      will be discharged on the above regimen.  The patient's CBGs were      much better controlled on day of discharge with CBGs ranging from      201-276 prior to discharge.  The patient should follow up with her      primary care physician regarding possible titration of her      medications for her diabetes.  3. Hypertension.  The patient presented with a blood pressure of      125/76, however, on day one of admission did have systolics down  to      a low of 69.  This improved with discontinuing the patient's HCTZ,      as well as with fluid bolusing and rehydration.  Patient's  clonidine was also tapered down to the discharge dose given      hypotension with improvement in blood pressures.  The patient will      be discharged on medications mentioned above.  4. Schizophrenia and depression.  The patient was continued on home      doses of doxepin and Lexapro.  The patient did exhibit one episode      in which she did not respond to stimuli and appeared to be      catatonic and forcibly closing her eyes.  This issue quickly      resolved.  This had occurred on day three of hospitalization.  In      speaking with the patient's primary care physician, the patient has      had episodes of this in the past with her schizophrenia.  This      should be followed up in the outpatient setting.  The patient's      amitriptyline was held given that the patient experienced some low      pressures rather when her amitriptyline and Darvocet were given at      the same.  5. Hypokalemia.  This is a chronic problem for this patient.  The      patient's HCTZ was discontinued given low pressures and the      patient's potassium was repleted several times during the course of      her hospitalization with a discharge potassium of 3.6.  The patient      will be discharged on oral potassium but at a different dose as      mentioned in medication list in discharge summary.  The patient did      not have symptoms with her low potassium and her potassium was at      its lowest point on discharge as mentioned in lab section above. 6.      Right lower extremity cellulitis.  The patient remained afebrile      for the course of her hospitalization, was started on IV      clindamycin and switched to p.o. clindamycin and the patient will      be discharged on clindamycin for a total of 5 days in the      outpatient setting.  The patient's right lower  extremity pain with      her cellulitis improved over the course of her hospitalization.  6. Asthma.  The patient was continued on Advair and albuterol during      the course of her hospitalization.  The patient did have episode of      wheezing and stridor on November 18, 2008 overnight and required BiPAP,      albuterol nebulizer, and Decadron, however, the patient had      improved substantially on the morning following this episode and      was able to be weaned off of oxygen onto room air.  The patient      will be continued on her medications as before.  The patient's      Advair was increased to maximum dose for discharge.  7. GERD/Barrett's esophagus.  The patient was continued on      pantoprazole 40 mg p.o. daily.  8. Rheumatoid arthritis.  The patient had received her methotrexate      the weekend prior to admission and this was not continued.  The      patient's Darvocet was stopped during the course of her  hospitalization given hypotension when given with amitriptyline,      however, this was restarted for discharge.  The patient had also      become somnolent with her Darvocet and amitriptyline; hence, it was      held during the course of hospitalization but as before will be      given on discharge.  9. Gout.  The patient's allopurinol was decreased to 150 mg p.o. daily      given her GFR, the patient's colchicine will be held  throughout      the course of her hospitalization and for discharge given the      patient's GFR, as well as multiple medical interactions with this      medication.  10.Hyperlipidemia.  The patient was continued on her home dose of      simvastatin.  11.Bladder spasm.  The patient was continued on home dose of      oxybutynin   FOLLOWUP:  The patient will follow up with Dr. Ronnald Ramp at [facility] center on November 25, 2008 at 2:15 p.m.  The patient will  follow up at [facility] for INR check on November 22, 2008 at 2:00 p.m.   The patient was discharged to home in stable and improved condition.      Mariana Arn, MD  Electronically Signed      Blane Ohara McDiarmid, M.D.  Electronically Signed    KC/MEDQ  D:  11/29/2008  T:  11/29/2008  Job:  [id number]

## 2010-12-29 NOTE — Discharge Summary (Signed)
[name], JOURNIGAN              ACCOUNT NO.:  [id number]   MEDICAL RECORD NO.:  [id number]          PATIENT TYPE:  INP   LOCATION:  1615                         FACILITY:  [facility]   PHYSICIAN:  Pietro Cassis. Alvan Dame, M.D.  DATE OF BIRTH:  Jan 05, 1951   DATE OF ADMISSION:  05/09/2007  DATE OF DISCHARGE:  05/12/2007                               DISCHARGE SUMMARY   ADMITTING DIAGNOSES:  1. Rheumatoid arthritis.  2. Diabetes.  3. Schizophrenia.  4. Reflux disease.  5. Asthma.   DISCHARGE DIAGNOSES:  1. Rheumatoid arthritis.  2. Diabetes.  3. Schizophrenia.  4. Reflux disease.  5. Asthma.  6. Hypokalemia.   CONSULTANTS:  None.   PROCEDURE:  Right total knee replacement.   SURGEON:  Paralee Cancel, MD.   ASSISTANT:  Rowan Blase, PA   BRIEF HISTORY OF PRESENT ILLNESS:  This is a 60 year old female with a  history of persistent progressive right knee pain secondary to  rheumatoid arthritis.  She has had been refractory to all conservative  treatments.  She has been closely followed the past by Dr. Charlestine Night and  presurgically assessed prior to surgery.   LABORATORY DATA:  Preadmission CBC reveals hematocrit of 40.3, postop  day number one 31.6, at discharge 31.4 and stable.  Coagulations  negative.  Routine chemistry:  Sodium 142, potassium 2.9, glucose 158  and BUN 26 prior to admission; on postop day #1, potassium 3, glucose of  135; postop day #2, potassium at 2.9, glucose at 155; postop day #3,  potassium at 2, glucose at 189.  He was replenished and rechecked,  potassium at 2 still, glucose at 211.  The next day, postop day #3,  potassium 2.3 at discharge and glucose of 159.  Kidney function normal.  Calcium 8.8 at discharge.  GI workup showed some elevated AST at 39, ALT  at 59.  UA was negative.   CARDIOLOGY:  EKG showed sinus tachycardia with possible left atrial  enlargement, right bundle branch block and a left anterior fascicular  block and LVH with QRS widening.   HOSPITAL COURSE:  The patient underwent right total knee replacement and  tolerated the procedure well and admitted to the orthopedic floor.  She  remained afebrile through course of the stay.  Dressing was changed on a  daily basis after postop day #1 and no significant wound drainage.  She  remained neurovascularly intact to her right lower extremity throughout  and was able perform a straight-leg raise prior to discharge.  PT, OT  and weightbearing as tolerated were start on postop day #1 with MCL knee  hinge brace for 6 weeks.  DVT prophylaxis was started with the Lovenox  administration.  She did have some hypokalemia for which we provided K-  Dur, which did not alleviate the hypokalemia.  She does have a history  in the past of hospitalizations of having some hypokalemia.  Postop day  #2, she did feel slightly lightheaded when walking, otherwise  progressing well.  She was outfitted with a knee sleeve that was a  little smaller to help provide stability to the knee due  to some MCL  laxity.  She was replenished ongoing with K-Dur and at discharge, her  potassium was 2.3.  No other events.  She progressed nicely, ambulating  at least 50 feet prior to discharge.  We did increase her K-Dur home  medication prior to discharge.   DISCHARGE DISPOSITION:  Discharged home with home health care, PT and  Lovenox teaching.   DISCHARGE DIET:  Carbohydrate-moderated; she is diabetic.   DISCHARGE WOUND CARE:  Keep dry.   DISCHARGE PHYSICAL THERAPY:  Walk with assistance with a rolling walker  for 2 weeks and transition to single-point cane.   DISCHARGE FOLLOWUP:  She will follow up with Dr. Alvan Dame at [phone] in 2  weeks.   DISCHARGE MEDICATIONS:  1. Vicodin 5/325 one to two p.o. q.4-6 h.  2. Lovenox 30 mg subcu q.12 h. for 11 days.  3. Robaxin 500 mg p.o. q.6 h.  4. Enteric-coated aspirin 325 mg p.o. daily for 4 weeks after the      Lovenox is completed.  5. Iron 325 mg one p.o. t.i.d.  for 2 weeks.  6. Colace 100 mg p.o. b.i.d.  7. MiraLax 17 g p.o. daily.  8. K-Dur 60 mEq one p.o. 3 times daily.  9. Methotrexate 2.5 mg two tablets twice a day on Saturdays.  10.Hydrochlorothiazide 25 mg one tablet b.i.d.  11.Oxybutynin 5 mg one p.o. b.i.d.  12.Furosemide 80 mg one p.o. b.i.d.  13.Darvocet one p.o. q.6 h. p.r.n.  14.Prilosec 20 mg one p.o. b.i.d.  15.Lexapro 10 mg one p.o. daily.  16.Doxepin HCl 25 mg one at bedtime and Doxepin HCl 100 mg one at      bedtime.  17.Zocor 10 mg one p.o. at bedtime.  18.Clonidine 0.2 mg two at bedtime.  19.Ranitidine 300 mg one at bedtime.  20.Spironolactone 50 mg one tablet daily.  21.Prempro 0.3 to 1.5 mg one tablet daily.  22.Amitriptyline 25 mg one tablet at bedtime.  23.Glipizide ER 10 mg one tablet a day.  24.Prednisone 10 mg one tablet a day.  25.Albuterol 90-mcg inhaler, use as directed.  26.Advair 250/50 mcg one puff twice a day.  27.Over-the-counter vitamin C daily.  28.Calcium 600 mg daily.  29.Multivitamin daily.  30.Colchicine 0.6 mg one to two times daily.     ______________________________  Carlean Jews Collene Mares, [place]      Pietro Cassis. Alvan Dame, M.D.  Electronically Signed    BLM/MEDQ  D:  05/30/2007  T:  05/31/2007  Job:  [id number]   cc:   Lindaann Slough, M.D.  [place]  [place] [address]

## 2010-12-29 NOTE — Consult Note (Signed)
NAMEJARETSY, [name]              ACCOUNT NO.:  [id number]   MEDICAL RECORD NO.:  [id number]          PATIENT TYPE:  INP   LOCATION:  3707                         FACILITY:  [facility]   PHYSICIAN:  Felizardo Hoffmann, M.D.  DATE OF BIRTH:  [birth year]   DATE OF CONSULTATION:  05/18/2007  DATE OF DISCHARGE:                                 CONSULTATION   REASON FOR CONSULTATION:  Psychosis.   HISTORY OF PRESENT ILLNESS:  [name] is a 60 year old female  admitted to the [facility] on May 17, 2007 due to altered mental  status and decreased level of consciousness.   The patient has had stress with a recent right knee replacement.  Her  niece has been noting decreased responsiveness during the day.  The  niece called EMS who brought the patient into the emergency room.   The patient has been taking Elavil 25 mg q.h.s. along with doxepin 125  mg q.h.s.  She also has had in addition a recent opioid pain medication  with her knee operation.   Since coming in the hospital, the patient's cognition, memory and  alertness have improved.  She has had a return of auditory  hallucinations that are very derogatory.  They are the typical ones that  occur during her schizophrenia exacerbation.  She does not have any  thoughts of harming herself or others.  However, she did have some  thoughts of harming herself last night.  She is able to push those out  and contract for safety.   She is not paranoid.  She does not have delusions.  She is cooperative  with bedside care.   Other than the distressed produced by the hallucinations, the patient  has intact interests with mild depressed mood.  Her energy is mildly  decreased.  Concentration is mildly decreased.   PAST PSYCHIATRIC HISTORY:  [name] began experiencing episodes of  severe derogatory auditory hallucinations when she was a teenager.  She  did have episodes where they would converse with each other.  The  patient has  attempted suicide multiple times during her adolescence and  required multiple psychiatric admissions.   The patient states that her last psychiatric admission was more than 10  years ago.   She has been treated in the past with Haldol and Thorazine.  She  developed adverse reactions to Haldol and Thorazine.   The patient has not been on a maintenance antipsychotic recently.  She  has been maintained at home on Elavil 25 mg nightly and doxepin 125 mg  nightly.  She also has been on Lexapro 10 mg daily.   FAMILY PSYCHIATRIC HISTORY:  None known.   SOCIAL HISTORY:  [name] was born in [place].  She was adopted,  and does not know the biologic family history.  She has been living with  her adopted niece.  The patient does not have any children.  Occupation:  On disability.  Does not use alcohol or illegal drugs.   PAST MEDICAL HISTORY:  1. Diabetes mellitus.  2. Gastroesophageal reflux.  3. Chronic venous insufficiency.  4. Obesity.  5. Osteoarthritis.  6. Hypertension.  7. Urge incontinence.  8. Gout.  9. Hyperlipidemia.  10.Asthma.  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  12.Persistent hypokalemia.  13.History of nephrolithiasis.  14.History of Barrett's esophagus.   SURGICAL HISTORY:  1. Right knee replacement in September 2008.  2. Appendectomy.  3. Cholecystectomy.  4. Bladder stretching.   MEDICATIONS:  AMA is reviewed.  The patient is on:  1. Elavil 25 mg nightly.  2. Doxepin 125 mg nightly.  3. Lexapro 10 mg daily.   ALLERGIES:  DILANTIN, LIBRIUM, HALDOL, VALIUM, ELAVIL, THORAZINE,  ASPIRIN, CODEINE, ERYTHROMYCIN AND PENICILLIN.   LABORATORY DATA:  Urine drug screen positive for opiates.  Alcohol  negative.  BUN 9, creatinine 0.6, calcium 9.8, WBC 6.6, hemoglobin 10.2,  platelet count 304.  TSH 1.593 within normal limits.  SGOT 46, SGPT 101.   REVIEW OF SYSTEMS:  CONSTITUTIONAL:  Afebrile.  No weight loss.  HEAD:  No trauma.  EYES:  No visual changes.  EARS:  No hearing  impairment.  NOSE:  No rhinorrhea.  MOUTH/THROAT:  No sore throat.  NEUROLOGIC:  No  focal motor or sensory deficit.  PSYCHIATRIC:  As above.  CARDIOVASCULAR:  No current chest pain, however, the patient's QTC was  initially 541 and is now decreased down to 430.  RESPIRATORY:  No  coughing or wheezing.  GASTROINTESTINAL:  No nausea, vomiting, diarrhea.  The patient did eat 75% breakfast, 75% lunch.  GENITOURINARY:  No  dysuria.  SKIN:  Unremarkable.  ENDOCRINE/METABOLIC:  No heat or cold  intolerance.  MUSCULOSKELETAL:  As above.  HEMATOLOGIC/LYMPHATIC:  Mild  anemia.   PHYSICAL EXAMINATION:  VITAL SIGNS:  Temperature 97.8, pulse 95,  respiratory rate 18, blood pressure 105/57, O2 saturation on 2 liters  98%.  GENERAL APPEARANCE:  [name] is a middle-aged female sitting up in  her hospital bed.  She does not have any abnormal involuntary movements.  MENTAL STATUS EXAM:  [name] is alert.  Her eye contact is good.  Her  attention span is mildly decreased.  Her concentration is mildly  decreased.  She is oriented completely to all spheres.  Her memory is  intact for immediate recent and remote except for delirium.  Speech  involves normal rate.  Prosody is slightly flat.  There is no  dysarthria.  Fund of knowledge and intelligence are grossly within  normal limits.  Thought process logical, coherent, goal-directed.  No  looseness of associations.  Language expression and comprehension are  intact.  Thought content:  No thoughts of harming herself, no thoughts  of harming others.  No hallucinations at the time of the interview.  No  delusions.  Affect is anxious.  Mood is anxious.  Insight is good for  being aware of her psychiatric symptoms.  Judgment is intact.   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  (296.35)  Major depressive disorder,  recurrent in partial remission.  (293.00)  Delirium due to multiple factors, improved.  Of note, the  patient has been on significant anticholinergic medication effects with  the doxepin as well as the Elavil.  Also, she is anemic.  She has the  other general medical problems and has been requiring recent opiates for  pain.  AXIS II:  None.  AXIS III:  See general medical problems above.  AXIS IV:  General medical.  AXIS V:  40.  The undersigned provided ego supportive psychotherapy and discussed  starting a new antipsychotic medication as below.  The patient  understands and would like to proceed with treatment.   RECOMMENDATIONS:  Would start Zyprexa at 5 mg p.o. at 1800 for anti  psychosis as well as augmenting Lexapro and antidepression.   RECOMMENDATIONS:  The Elavil and doxepin do present significant  anticholinergic problems and can contribute to delirium, although the  patient may require a certain amount of anticholinergic therapy for her  bladder problem.   Would acutely reduce the load of tricyclic by stopping Elavil and  reducing the doxepin to 75 mg q.h.s.  If possible, would pursue other  urological intervention for the patient's bladder problem as well as  alternative pain treatments, and GER treatments besides Elavil and  doxepin.  Both medications also present significant cardiac conduction  risk as well as high lethality and overdose given the patient's history  of suicide attempts (she does have a history of command self-destructive  auditory hallucinations as well).   RECOMMENDATIONS:  1. Would continue the Lexapro 10 mg q.a.m. for antidepression      antianxiety.  Anticipate increasing it to 20 mg q.a.m.  2. If the patient continues with her psychiatric symptoms.  Anticipate      that she will need an inpatient psychiatric unit once medically      cleared.   She does not require a sitter while in the supportive environment of a  hospital.  She does agree  to call the staff immediately for any thoughts  of harming herself.      Felizardo Hoffmann, M.D.  Electronically Signed     JW/MEDQ  D:  05/18/2007  T:  05/19/2007  Job:  [id number]

## 2010-12-29 NOTE — Discharge Summary (Signed)
[name], [name] NO.:  [id number]   MEDICAL RECORD NO.:  [id number]          PATIENT TYPE:  INP   LOCATION:  6705                         FACILITY:  [facility]   PHYSICIAN:  Dickie La, MD        DATE OF BIRTH:  [birth year]   DATE OF ADMISSION:  05/17/2007  DATE OF DISCHARGE:  05/23/2007                               DISCHARGE SUMMARY   PRIMARY CARE PHYSICIAN:  Dixon Boos, M.D., at [facility].   DISCHARGE DIAGNOSES:  1. Psychosis, not otherwise specified.  2. Status post right knee replacement.  3. Urinary tract infection.  4. Gastroesophageal reflux disease.  5. Gout.  6. Diabetes mellitus type 2.  7. Hyperlipidemia.  8. Osteoarthritis versus rheumatoid arthritis.   DISCHARGE MEDICATIONS:  1. Losartan 25 mg p.o. once daily.  2. Allopurinol 300 mg p.o. once daily.  3. Multivitamin one tablet p.o. once daily.  4. Calcium carbonate 500 mg calcium p.o. once daily.  5. Simvastatin 10 mg p.o. daily.  6. Aspirin 81 mg p.o. daily.  7. Prednisone 10 mg p.o. daily.  8. Pantoprazole 40 mg p.o. b.i.d.  9. Advair 1 puff b.i.d.  10.Clonidine 0.4 mg p.o. nightly.  11.Glipizide 10 mg p.o. daily.  12.Escitalopram 10 mg p.o. daily.  13.Metformin 500 mg p.o. b.i.d.  14.Conjugated estrogen 0.3 mg p.o. daily.  15.Medroxyprogesterone acetate 1.25 mg p.o. daily.  16.Doxepin 50 mg p.o. nightly.  17.Acetaminophen 650 mg p.o. q.6 h. p.r.n. pain.  18.Albuterol 2 puffs q.6 h. p.r.n. wheeze.   CONSULTATIONS:  1. Psychiatry x2.  2. Pharmacy, heparin.  3. Neurology.   LABORATORY DATA:  On admission, D-dimer was 2.23.  Cardiac enzymes on  May 17, 2007, at 8:30 a.m., creatinine kinase 30, CK-MB 1.0, troponin  I 0.02.  TSH on May 17, 2007, 1.593.  Urine cultures on May 19, 2007, showed Klebsiella pneumoniae sensitive to trimethoprim, Septra and  ciprofloxacin.  Hemoglobin A1c 6.6, vitamin B12 of 442, folate RBC 1338.  On discharge, CBC:  WBC  6.7, hemoglobin 10.6, hematocrit 31.4, MCV  103.2, RDW 15.6 with a platelet count of 297.  On May 22, 2007, CMP:  Sodium 143, potassium 3.6, chloride 111, CO2  of 25, BUN 15, creatinine  0.8, glucose 67, calcium 9.1, total bilirubin 0.7, alkaline phosphatase  58, AST 30, ALT 57, total protein 5.2, albumin 2.9.   BRIEF HOSPITAL COURSE:  [name] is a 60 year old female status post  right knee replacement on May 09, 2007, with multiple medical  problems and psychotic disorder who was admitted with altered mental  status on May 17, 2007.  1. At the time of admission, she was ruled out for myocardial      infarction and pulmonary embolism.  Her altered mental status was      ultimately attributed to acute psychotic episodes.  In her lucent      period she admitted to having hallucinations during these episodes      and is unresponsive during these episodes.  2. Psychosis.  Psychiatry was consulted and recommended decrease in  doxepin, discontinuation of her amitriptyline, and adding Zyprexa 5      mg daily.  They also recommended that the patient be admitted to      psych inpatient as soon as she was medically clear.  3. Urinary tract infection.  The patient was treated with      ciprofloxacin 250 mg p.o. q.8 h. x3 days.  4. Pain control.  The patient suffers lower extremity pain due to both      right knee replacement and bilateral diabetic neuralgia.  Have      attempted several different pain regimens on this admission      including morphine, Toradol, and acetaminophen.  Toradol doses have      been maxed out and so we are treating now with ibuprofen and      acetaminophen.  We may consider gabapentin for the neuralgia, but      would start cautiously given the CNS side effect profile, which      includes somnolence.  There was an apparent adverse reaction to      morphine this admission and would be very cautious with further use      of opioids in this patient.  5.  Gout.  No acute flare at this time.  Has held colchicine and will      continue allopurinol on transfer.  6. Gastroesophageal reflux disease.  Continue pantoprazole.  7. Diabetes type 2.  Continue metformin and glipizide.  8. Hyperlipidemia.  Continue simvastatin.  9. Osteoarthritis versus rheumatoid arthritis.  The patient is status      post right knee replacement on May 09, 2007.  Continue daily      prednisone, but have held her weekly methotrexate dose at this      time.  She was on enoxaparin for 11 days after her surgery and last      dose was given on October 6.  10.Menopausal symptom relief.  The patient is also on estrogen,      progesterone combination to be continued.  11.Disposition.  This patient is stable for transfer to inpatient      psychiatry.   PROCEDURES:  1. A CT of the head with no contrast, May 17, 2007.  2. Chest x-ray May 17, 2007.  3. CT angiogram of the chest May 17, 2007.  4. CT of the head repeat, no contrast May 21, 2007.  5. Two view film of the right knee May 23, 2007.   DISCHARGE INSTRUCTIONS:  1. The patient is to be maintained on a low sodium, heart healthy      diet, and should walk only with assistance.  She was a high risk      for falling.  2. Wound care.  Keep the right knee clean and dry and change dressings      as appropriate.  3. She is to be transferred to [facility] for inpatient      psychiatric care.  4. The patient is in agreement with this plan.  States she desires      assistance with her psychosis.   CONDITION ON DISCHARGE:  This patient is discharged to [facility] in stable medical condition but with active  psychiatric issues.      Graciella Belton, MD  Electronically Signed      Dickie La, MD  Electronically Signed    MO/MEDQ  D:  05/23/2007  T:  05/24/2007  Job:  [id number]   cc:  Felizardo Hoffmann, M.D.

## 2010-12-29 NOTE — Consult Note (Signed)
[name], [name] NO.:  [id number]   MEDICAL RECORD NO.:  [id number]          PATIENT TYPE:  INP   LOCATION:  [id number]                         FACILITY:  [facility]   PHYSICIAN:  Loralie Champagne, MD      DATE OF BIRTH:  1950/11/14   DATE OF CONSULTATION:  DATE OF DISCHARGE:                                 CONSULTATION   PRIMARY CARE PHYSICIAN:  Dixon Boos, MD with [facility].   HISTORY OF PRESENT ILLNESS:  This is a 60 year old with multiple medical  problems including asthma, obesity hypoventilation syndrome, and  diastolic congestive heart failure who presented with pleuritic left-  sided chest pain and shortness of breath yesterday.  The patient was  found to have a pulmonary embolus in a left upper pulmonary artery in  April 2010.  At that time, she actually had pleuritic right-sided chest  pain even though the pulmonary embolus was on the left, this did  resolve.  However, the patient developed left-sided chest pain 2 days  ago that is worse with deep breathing, laughing, or coughing.  She is  short of breath with minimal exertion such as just walking around her  house.  She thinks that this shortness of breath is probably secondary  to the pain that she gets when she breaths deeper with exertion.  She  did come into the hospital.  Cardiac enzymes have been negative.  She  had a CTA of her chest done.  It was a little difficulty study, but it  showed no definite new pulmonary embolus.  However, there was a new  moderate left pleural effusion, and there actually appeared to be a  slightly increased moderate pericardial effusion.   MEDICATIONS:  1. Doxepin.  2. Lexapro.  3. Lasix 80 mg IV x1 bolus.  4. Insulin.  5. Olmesartan 5 mg daily.  6. Protonix.  7. Zocor 10 mg daily.  8. Lyrica.  9. Warfarin.   PAST MEDICAL HISTORY:  1. Asthma.  2. Obesity.  3. Chronic kidney disease.  4. Chronic anemia.  5. Type 2 diabetes.  6. Gout.  7. Rheumatoid arthritis.  8. Depression.  9. Gastroesophageal reflux disease.  10.Obesity hypoventilation syndrome.  The patient does use CPAP at      night.  11.Hypertension.  12.Hyperlipidemia.  13.Pulmonary embolus diagnosed in April 2010.  The patient is on      Coumadin.  14.Schizophrenia.  15.Diastolic congestive heart failure.  Most recent echo was done in      April 2010 showed an EF of 55-60%.  There is a small-to-moderate      pericardial effusion.  This was a difficulty study.  There was no      definite evidence for tamponade, however.   SOCIAL HISTORY:  The patient is divorced.  She lives with her niece and  her mother.  She is disabled.  She has no children.  She quit smoking in  1973.  Does not drink alcohol or use any drugs.   FAMILY HISTORY:  Type 2 diabetes and coronary artery disease.   REVIEW OF  SYSTEMS:  All systems were reviewed, were negative except as  noted in the history of present illness.   PHYSICAL EXAMINATION:  VITAL SIGNS:  The patient is afebrile.  Pulse is  109 and regular, blood pressure 98-100/60s, oxygen saturation 96% on 2 L  nasal cannula.  I did measure the patient's pulsus paradoxus, it was  elevated at 16-18 mmHg.  GENERAL:  The patient is obese female in mild distress when she takes a  deep breath.  HEENT:  Normal exam.  ABDOMEN:  Soft, nontender.  No hepatosplenomegaly.  Obese.  NECK:  JVP is difficult to assess due to the patient's thick neck.  CARDIOVASCULAR:  Heart is regular, tachycardic.  Heart sounds are  distinct.  I do not hear an S3, S4, or murmur.  There is 1+ ankle edema.  LUNGS:  There are wheezes bilaterally with bibasilar crackles and  decreased breath sounds in the left base about a third to a half of the  up.  MUSCULOSKELETAL:  Normal exam.  EXTREMITIES:  No clubbing or cyanosis.  SKIN:  Normal exam.  NEUROLOGIC:  Alert and oriented x3.  Normal affect.   RADIOLOGY:  CTA of chest shows moderate pericardial effusion  that may be  mildly increased.  There is a moderate left pleural effusion.  There is  no definite pulmonary embolus.  EKG shows sinus tachycardic with left  anterior fascicular block and right bundle-branch block.   LABORATORY DATA:  Potassium 3.5, creatinine 0.93.  BNP 125.  Hemoglobin  A1c 9.9.  Cardiac enzymes negative x2.  LDL 76, HDL 28, triglycerides  104.  INR 2.5.   A bedside echo was done given the patient's pulsus paradoxus and the  moderate pericardial effusion on CT chest.  This showed EF of  approximately 55%.  The LV was normal in size.  Wall motion was not well  seen due to difficult windows.  The RV appeared mildly enlarged with  mildly decreased systolic function.  It was difficult to assess the  interventricular septum to see if there is any septal bounce.  There was  a small organized pericardial effusion.  There was no pericardial  tamponade.  However, there was greater than 25% respirophasic variation  of the mitral inflow E wave suggesting interventricular interdependence.  The IVC measured about 2 cm with greater than 50% respirophasic  variation suggesting the right atrial pressures are not severely  enlarged.  There was a large left pleural effusion.  This study suggest  the possibility of some constrictive physiology given the variation of  mitral inflow.   ASSESSMENT AND PLAN:  This is a 60 year old with multiple medical  problems including asthma, diastolic congestive heart failure, and a  recent pulmonary embolus who presented with shortness of breath and  pleuritic left-sided chest pain.  She has a small organized pericardial  effusion.  There is no tamponade.  However, I cannot rule out  constrictive physiology.  CTA of her chest, which was a repeat study  compared to the April study, showed no new pulmonary embolus.  1. Chest pain.  The patient does have pleuritic chest pain.  I do not      think this is ischemic.  The patient's pericardium is thickened  and      there is a small pericardial effusion.  I suspect this is acute      pericarditis-type chest pain.  For now, I will start treating her      with aspirin 325 mg q.6  h. for inflammation.  If this helps and the      patient tolerates it, I would increase it to 650 mg p.o. q.6 h.  I      will check ESR.  The patient has rheumatoid arthritis, so this may      be a collagen-vascular disease related pericarditis and pleuritis.      Would even consider SLE with APLAS given the recent PE.  2. Dyspnea.  The patient is wheezing on exam and does appear volume      overloaded, though JVP is difficulty to assess.  She did have a      pulsus paradoxus.  We did a bedside echo, which showed no      tamponade.  There was a small organized pericardial effusion.      There was exaggerated respirophasic variation of the inflow across      the mitral valve, which suggests ventricular interdependence and      possible constrictive physiology.  Finally, she does have a      moderate left pleural effusion.  At this time, I would suggest      starting Lasix 40 mg IV q.8, giving her 3 doses of this, and then      reassessing her to see if this causes improvement.  I would also      start her on Xopenex nebs as she does have a history of asthma and      she is wheezing.  I question whether this could be bronchospastic,      though most likely I think it is cardiogenic.  I do think left      thoracentesis could be helpful.  Decubitus film did show that the      left pleural effusion is free flowing, and finally given the      suggestion of constrictive physiology by echo, if the patient      continues to be symptomatically short of breath, we could      eventually consider a hemodynamic right and left heart cath.      Loralie Champagne, MD  Electronically Signed     DM/MEDQ  D:  12/20/2008  T:  12/21/2008  Job:  [id number]

## 2010-12-29 NOTE — H&P (Signed)
NAMECLOTILE, LEGARE              ACCOUNT NO.:  [id number]   MEDICAL RECORD NO.:  [id number]          PATIENT TYPE:  INP   LOCATION:  4704                         FACILITY:  [facility]   PHYSICIAN:  [place] A. Walker Kehr, M.D.    DATE OF BIRTH:  December 01, 1950   DATE OF ADMISSION:  12/19/2008  DATE OF DISCHARGE:                              HISTORY & PHYSICAL   CHIEF COMPLAINT:  Difficulty breathing and chest pain.   PRIMARY CARE Aura Bibby:  Dixon Boos, MD   HISTORY OF PRESENT ILLNESS:  A 60 year old female with multiple  comorbidities, presented with shortness of breath and chest pain a  prolonged duration.  Chest pain initially started at yesterday p.m. on  left-sided chest associated with shortness of breath.  At that time, the  patient went to room and lie down after given her something nebulizer  treatment.  Nightly activities were normal.  The patient felt better  afterwards.  This a.m., the patient completed normal daily task and at 2  p.m. began to having left-sided chest pain or shortness of breath  aggravated with deep inspiration.  The patient placed CPAP on and this  has been helping shortness of breath recently, but had no change in  chest pain or shortness of breath.  Denies nausea, vomiting,  diaphoresis.  Subjectively listed out warm, temperature was 99.4 per  niece.  Note, chest pain was not the same as previous pain when the  patient had PE.  As the patient is on Coumadin therapy, did not take  aspirin and started pain and no nitroglycerin was available.  EMS was  called.  The patient was taken to [facility] ED for evaluation and  workup for shortness of breath revealed mild-to-moderate pulmonary edema  and elevated BNP concerning for CHF.  The patient was also requiring 3-  week of O2 to maintain saturations, which is new.  EKG did not show any  evidence of ischemia and the cardiac enzymes were negative x1.  The  patient was transferred to [facility] for further care.   REVIEW  OF SYSTEMS:  No sick contacts.  No URI symptoms.  No dysuria,  urinary frequency, however, unchanged.  No body aches.  Normal bowel  movements.   PAST MEDICAL HISTORY:  1. Pulmonary embolism.  2. Depressive disorder, NOS.  3. Asthma.  4. Diabetes mellitus type 2.  5. Gout.  6. Coronary artery disease.  7. Hypertension.  8. Osteoarthritis.  9. GERD.  10.Schizophrenia.  11.Diabetic peripheral neuropathy.   MEDICATIONS:  1. Advair 550 one puff b.i.d.  2. Albuterol 90 mcg one puff q.4 h. p.r.n.  3. Allopurinol 300 mg p.o. b.i.d.  4. Colchicine 0.6 mg p.o. b.i.d.  5. Darvocet 100/650 q.6 h. p.r.n. pain.  6. Diovan 40 mg p.o. daily.  7. Doxepin 100 mg p.o. nightly.  8. Glipizide 10 mg p.o. daily.  9. Hydrochlorothiazide one p.o. b.i.d.  10.Lasix 80 mg p.o. b.i.d.  11.Lexapro 10 mg p.o. daily.  12.Metformin 500 mg p.o. b.i.d.  13.Potassium chloride 20 mEq two tablets p.o. daily.  14.Ranitidine 300 mg p.o. daily.  15.Spironolactone 50  mg p.o. daily.  16.Zocor 10 mg p.o. daily.  17.Nexium 40 mg p.o. b.i.d.  18.Calcium 500 one tablet p.o. t.i.d.  19.Methotrexate 2.5 mg as directed per Dr. Charlestine Night.  20.Oxybutynin chloride 5 mg p.o. b.i.d. as directed.  21.Lyrica 100 mg p.o. t.i.d.  22.Lantus 30 units subcu nightly.  23.Warfarin as directed.  24.Folic acid 1 mg p.o. daily while on methotrexate.   ALLERGIES:  CODEINE, PENICILLIN, SULFA, LITHIUM, VALIUM, COGENTIN.   PHYSICAL EXAMINATION:  VITAL SIGNS:  Temperature 98.9, heart rate 101,  respiratory rate 20, BP 92/59, O2 sat 97% on 3 liters.  GENERAL:  Mild distress during exam.  Chest pain 10/10 in the left  chest, obese, nonradiating, alert and oriented x3.  HEENT:  PERRL, nonicteric, and pink conjunctivae.  Dry tongue was  slightly moist mucous membranes.  Oropharynx clear.  NECK: Supple, full range of motion.  CHEST:  Chest wall tender to palpation on the left chest.  LUNGS:  Mild scattered expiratory wheeze.  Decreased  breath sounds at  bases with mild bibasilar crackles, fair air movement.  HEART:  Normal rate and rhythm, S1 and S2.  No murmurs.  ABDOMEN:  Positive bowel sounds, soft, nontender, nondistended.  No  masses.  EXTREMITIES:  Venous stasis changes in lower extremities.  Pulses 1+.  1+ pitting edema in the lower extremities.  Erythema bilaterally and  circumstantial band.  Healing diabetic ulcer, right lower extremities.  NEUROLOGIC:  Alert and oriented x3.  Cranial nerves II through XII  intact.  Decreased sensation in lower extremities.   FAMILY HISTORY:  Father died of black lung, mother with bone cancer,  brother with lung cancer, and sister died of colon cancer.  Family  history of diabetes, coronary artery disease and MI.   SOCIAL HISTORY:  Divorced, lives with sister and niece.  Stopped smoking  in 1973.  Denies EtOH, alcohol.   PAST SURGICAL HISTORY:  Appendectomy, cholecystectomy, bilateral knee  replacement 2008.   ASSESSMENT AND PLAN:  A 60 year old female admitted with shortness of  breath and chest pain.  1. Chest pain.  The patient with atypical chest pain and shortness of      breath, multiple differentials with complicated medical history.      We will admit to telemetry, currently stable on 3 liters of oxygen      on floor.  Chest x-ray concern for congestive heart failure with      elevated BNP.  We will give Lasix 80 mg IV x1 and reassess status      in the a.m. regarding diuretic therapy, repeat BMP in a.m.  Last      note, ejection fraction 55-60% in April 2010.  Other differentials      include pulmonary embolism, modified Wells criteria is      indeterminate with a score of 4.5.  We will obtain CT angiogram as      renal status was stable at this point, does not appear to be asthma      exacerbation.  We will continue home medications.  We will cycle      cardiac enzymes and repeat EKG in a.m., fasting lipid panel and      hemoglobin A1c.  If any changes, we will  have low thresholds to      consult Cardiology.  Morphine as needed.  Nitroglycerin as needed.      Unclear if coronary artery disease and blood pressure normotensive      therefore, we will hold beta-blocker,  continue ARB, oxygen,      Coumadin therapy, statin.  Of note, the patient given nitroglycerin      x1 with chest pain during physical exam.  2. Shortness of breath per above, differentials.  We will continue      CPAP as well as they may have some large component if the patient's      presentation is well.  3. Hypertension.  Continue home medications with the exception of      hydrochlorothiazide as the patient not taken.  At this point, we      will titrate blood pressure medications, tolerating currently a low      normotensive.  4. Diabetes mellitus.  We will hold home medications of metformin and      glipizide.  Continue Lantus 30 units and add sliding scale insulin.      Check hemoglobin A1c.  5. Pulmonary embolism.  CT angiogram for dyspnea.  Continue Coumadin      therapy.  Check coag.  6. Sleep apnea.  CPAP at night.  7. Asthma.  Continue home medication.  8. Depression.  Continue home medication.  9. Fluids, electrolytes, nutrition.  Heart healthy diet.  No IVF.      Secondary to possible congestive heart failure, BMET in a.m.  10.Prophylaxis.  Protonix 40 mg p.o. b.i.d., SCDs.   DISPOSITION:  Pending clinical improvement.      Vic Blackbird, MD  Electronically [place] A. Walker Kehr, M.D.  Electronically Signed    KD/MEDQ  D:  12/22/2008  T:  12/23/2008  Job:  [id number]

## 2010-12-29 NOTE — Consult Note (Signed)
NAMEABBEGAYLE, MARTELLO NO.:  [id number]   MEDICAL RECORD NO.:  [id number]          PATIENT TYPE:  IPS   LOCATION:  0300                          FACILITY:  [facility]   PHYSICIAN:  Dickie La, MD        DATE OF BIRTH:  March 01, 1951   DATE OF CONSULTATION:  DATE OF DISCHARGE:                                 CONSULTATION   ADDENDUM:  On discharge from the [facility], Ms. [name]  is to follow up with her primary care physician, Dr. Dixon Boos at the  [facility], phone number [phone].  She is also  to follow up with her rheumatologist, Dr. Charlestine Night.  In addition, Mr.  Boerema was placed on 80 mg p.o. b.i.d. of Lasix and 40 mg p.o. of K-Dur  daily.  She should have her basic metabolic panel checked daily and a  home visit with Dr. Ronnald Ramp should be scheduled as well.  The Lasix and K-  Dur were added as a result of increased bilateral swelling in her lower  extremities this morning.  This is edema which is new today.  It is  bilateral and is not believed to represent a DVT.      Graciella Belton, MD  Electronically Signed      Dickie La, MD     MO/MEDQ  D:  05/24/2007  T:  05/25/2007  Job:  [id number]

## 2010-12-29 NOTE — H&P (Signed)
NAMEDEBRINA, [name] NO.:  [id number]   MEDICAL RECORD NO.:  [id number]          PATIENT TYPE:  INP   LOCATION:  2103                         FACILITY:  [facility]   PHYSICIAN:  Dalbert Mayotte, MD        DATE OF BIRTH:  Jan 19, 1951   DATE OF ADMISSION:  11/29/2008  DATE OF DISCHARGE:                              HISTORY & PHYSICAL   PRIMARY CARE PHYSICIAN:  Dixon Boos, MD, at the [facility].   CHIEF COMPLAINT:  Shortness of breath.   HISTORY OF PRESENT ILLNESS:  This is a 60 year old female with extensive  past medical history who presented to the primary care physician this  morning with shortness of breath, which has been progressively worse all  week, and became markedly worse last night.  She was given 1 albuterol  nebulized treatment in the clinic and sent to the ED after there was no  improvement.  She was given continuous nebs in the ED, but still with no  improvement.  She denies cough, fever, chest pain, nausea, vomiting,  diarrhea, abdominal pain, or lower extremity swelling.  The patient was  hospitalized earlier this month for pulmonary embolism, and is currently  on warfarin with an INR of 2.6.  Her most recent echocardiogram from  November 18, 2008, showed an ejection fraction of 55-60%.   REVIEW OF SYSTEMS:  As per HPI.   PAST MEDICAL HISTORY:  1. Schizophrenia.  2. Diabetes mellitus type 2 with peripheral neuropathy.  3. Hypertension.  4. Hyperlipidemia.  5. Hypokalemia.  6. Gout.  7. Depression.  8. Asthma.  9. Chronic venous insufficiency.  10.Allergic rhinitis.  11.GERD.  12.Osteoarthritis.  13.Obesity.  14.Urge incontinence.  15.Barrett's esophagus, with an EGD in August 2007 which is to be      repeated every 2-3 years.  16.Nephrolithiasis in February 2003.  17.Pulmonary function tests in February 2008 showed mild obstructive      airway disease.  18.Amenorrhea since age 74.  62.CVA at age 8.   PAST SURGICAL  HISTORY:  1. Appendectomy in August 28, 2001.  2. Cholecystectomy August 28, 2001.  3. Bladder stretching August 28, 2001.  4. Bilateral knee replacement in 2008.   FAMILY HISTORY:  Her father is deceased from black lung disease.  Her  mother has bone cancer.  Her brother has lung cancer.  Her other brother  has blood cancer of some sort.  Her sister is deceased from colon  cancer.  There is a strong family history of diabetes mellitus, coronary  artery disease, and kidney disease.   SOCIAL HISTORY:  She is divorced and without children.  She lives with  and is cared for by her niece and her mother.  She is disabled secondary  to schizophrenia.  She does not use alcohol or illicit drugs.  She  stopped smoking in 1973.   ALLERGIES:  Are multiple and include:  1. VALIUM.  2. THORAZINE.  3. LIBRIUM.  4. HALDOL.  5. PENICILLIN.  6. ERYTHROMYCIN.  7. SULFA DRUGS.  8. ASPIRIN.  9. ADVIL.  10.DILANTIN.  11.CODEINE.   MEDICATIONS.:  1. Advair  Diskus 500/50 one puff inhaled b.i.d.  2. Albuterol 90 mcg 1 puff inhaled q.i.d.  3. Allopurinol 300 mg p.o. b.i.d.  4. Clonidine 0.1 mg p.o. b.i.d.  5. Colchicine 0.6 mg p.o. b.i.d.  6. Darvocet 100/650 one tab p.o. q.6 h. p.r.n. pain.  7. Valsartan 40 mg p.o. daily.  8. Doxepin 125 mg p.o. at bedtime.  9. Glipizide 10 mg p.o. daily.  10.Hydrochlorothiazide 25 mg p.o. b.i.d.  11.Furosemide 80 mg p.o. b.i.d.  12.Lexapro 10 mg p.o. daily.  13.Metformin 500 mg p.o. b.i.d.  14.Potassium chloride 60 mg p.o. b.i.d. and 40 mg p.o. daily.  15.Ranitidine 300 mg p.o. daily.  16.Spironolactone 50 mg p.o. daily.  17.Simvastatin 10 mg p.o. daily.  18.Nexium 40 mg p.o. b.i.d.  19.Calcium supplement 1 tab p.o. t.i.d.  20.Methotrexate 2.5 mg as directed by Dr. Charlestine Night.  21.Oxybutynin 5 mg p.o. b.i.d.  22.Lyrica 100 mg p.o. t.i.d.  23.Lantus 30 units subcu at bedtime.  24.Warfarin as directed for pulmonary embolism with an INR goal of 2-       3.  25.Folic acid 1 mg p.o. daily.   PHYSICAL EXAMINATION:  VITAL SIGNS:  Temperature 98.5, heart rate 98-  115, respirations 20-32, blood pressure 101-117/65-70, saturation 93-95%  on 2 L O2 nasal cannula.  GENERAL:  She is alert and oriented, obese, and comfortable appearing.  HEENT:  Dry mucous membranes.  CARDIOVASCULAR:  Regular rate and rhythm with no murmurs, rubs, or  gallops.  PULMONARY:  Diffuse expiratory wheeze with bibasilar crackles left  greater than right.  Increased work of breathing.  ABDOMEN:  Soft,  nontender, and obese.  EXTREMITIES:  Compression stockings of the bilateral lower extremities  nontender.  SKIN:  Right lower extremity with small serosanguineous stain on  compression stocking at infection site.  PSYCH:  No apparent delusions or hallucinations.  Appropriate affect,  anxious.   LABS AND STUDIES:  Sodium 136, potassium 4.0, chloride 100, bicarb 26,  BUN 35, creatinine 1.29, glucose 352, and calcium 9.3.  White blood  count 13.1, hemoglobin 10.5, hematocrit 30.4, and platelets 255.  INR  2.6.  Portable chest x-ray shows very low lung volumes with cardiomegaly  and vascular congestion.  Arterial blood gas has a pH of 7.444, pCO2 of  32.8, a pO2 of 66, bicarb of 22.5, and O2 saturation 94%.   ASSESSMENT/PLAN:  This is a 60 year old female with respiratory  distress.  1. Respiratory distress.  Appreciate critical care consult.  The      differential diagnoses at this time includes (in order of      likelihood):  Asthma exacerbation, CHF (which would be new for this      patient), and pneumonia.  Her ABG shows mild respiratory alkalosis.      Minimal response to nebulized treatment so far.  The patient is      currently on BiPAP and will be admitted to the medical intensive      care unit.  For asthma, we will continue albuterol and Atrovent      nebs per respiratory therapy and will start Solu-Medrol.  For CHF,      we will diurese with furosemide 80 mg  IV b.i.d., check her BNP, and      cycle her cardiac enzymes.  May need to repeat her echocardiogram      this admission.  We will cover with Avelox for possible community-      acquired pneumonia,  though less likely at this time.  2. Diabetes mellitus type 2.  Hold at home glipizide and metformin.      The patient takes Lantus 30 units at bedtime at home, we will      reduce to 15 units at bedtime while n.p.o. and cover with sensitive      sliding scale insulin q.4 h.  We may need to advance this covers as      she is now on high-dose steroids, and her admission blood glucose      is quite elevated at this time.  3. Hypokalemia.  This is a chronic problem for the patient and needs      to be monitored continuously.  Will continue her home      supplementation and follow her daily BMETs.  Of note, she is      normokalemic at this time with a potassium of 4.0 in the emergency      department.  4. Cellulitis, right lower leg.  This is well healed compared to the      previous admission.  No antibiotics.  Follow clinically.  5. Hypertension.  Stable at this point.  Continue home clonidine and      spironolactone.  Hold home doses of valsartan and HCTZ at this      time.  6. Hyperlipidemia.  Last LDL was 96 in July 2009.  Continue home      simvastatin.  7. Gout.  Continue home allopurinol and colchicine.  8. Schizophrenia and depression.  Stable at this time.  No      antipsychotic medications are listed on her home medication list.      We will continue the home doxepin and Lexapro.  9. Gastroesophageal reflux disease with Barrett's esophagus.  We will      treat with pantoprazole 40 mg p.o. b.i.d.  10.Osteoarthritis.  Continue the home Darvocet.  11.Rheumatoid arthritis.  Methotrexate is dose once weekly, not clear      when the next dose is due at this time.  12.Peripheral neuropathy.  Continue home Lyrica.  13.Bladder spasm.  Continue home oxybutynin.  14.Fluids, electrolytes,  nutrition/ GI.  N.p.o. for now while      diuresing.  Hep-Lock IV fluids as fluid overload is a possible      contributing factor to her respiratory failure.  15.Prophylaxis.  The patient is already anticoagulated and on the PPI      b.i.d.  16.Disposition, pending stabilization.  Once the patient is stable,      she will return to home where she is cared for by her niece and her      mother.      Graciella Belton, MD  Electronically Signed      Dalbert Mayotte, MD  Electronically Signed    MO/MEDQ  D:  11/29/2008  T:  11/30/2008  Job:  [id number]

## 2010-12-29 NOTE — H&P (Signed)
[name], [name]              ACCOUNT NO.:  [id number]   MEDICAL RECORD NO.:  [id number]          PATIENT TYPE:  INP   LOCATION:  3707                         FACILITY:  [facility]   PHYSICIAN:  Patria Mane, MD      DATE OF BIRTH:  [birth year]   DATE OF ADMISSION:  05/17/2007  DATE OF DISCHARGE:                              HISTORY & PHYSICAL   CHIEF COMPLAINT:  Altered mental status and decreased level of  consciousness.   HISTORY OF PRESENT ILLNESS:  [name] is a 60 year old white female  with a recent right knee replacement several days ago by Dr. Ricard Dillon of  orthopedic surgery, who found by her niece to have increased sleepiness  and decreased responsiveness during the day on May 16, 2007.  The  patient's niece was concerned enough that she called EMS who took Ms.  Ratts to [facility].  In the meantime, patient was given  Norocaine, as reportedly patient was prescribed an opioid for pain  control status post her right knee replacement.  Patient improved with  her Norocaine administration.  Upon stabilization at [facility], the family practice teaching service was called for admission  for this patient.  Patient was transferred to [facility] where  she continued to be sleepy, but complained of significant right leg  pain.  Patient states that in addition to the right leg pain, she does  have occasional heavy chest pain which is non-radiating and without  diaphoresis, nausea or vomiting.  Her primary complaint, however, is her  knee pain.  The history was difficult to obtain, as the patient is quite  sleepy and repetitively falls asleep during history taking.   PAST MEDICAL HISTORY:  1. Diabetes mellitus type 2 with peripheral neuropathy.  2. Schizophrenia.  3. Chronic venous insufficiency.  4. GERD.  5. Osteoarthritis versus rheumatoid arthritis.  6. Obesity.  7. Urge incontinence.  8. Hypertension.  9. Hyperlipidemia.  10.Gout.  11.Depression.  12.Asthma/mild COPD.  13.Persistent hypokalemia.  14.History of nephrolithiasis.  15.History of Barrett esophagus.   PAST SURGICAL HISTORY:  1. Right knee replacement in September of 2008.  2. Appendectomy.  3. Cholecystectomy.  4. Bladder stretching.   FAMILY HISTORY:  Patient's father died of black lung and mother has bone  cancer.  Patient's brother has lung cancer and a blood cancer.  Patient's sister reportedly has colon cancer.  Per previous documents,  the patient also has a strong family history of coronary artery disease,  myocardial infarction and diabetes mellitus type 2.   SOCIAL HISTORY:  Patient has not smoked since 1973.  She denies alcohol  or drugs.  She currently lives with her niece who helps her with her  medications.   CURRENT MEDICATIONS:  1. Advair 250/50 one puff b.i.d.  2. Albuterol one puff q.i.d.  3. Allopurinol 300 mg p.o. b.i.d.  4. Amitriptyline 25 mg p.o. q.h.s.  5. Clonidine 0.4 mg p.o. q.h.s.  6. Colchicine 0.6 mg p.o. b.i.d.  7. Darvocet q.6 hours p.r.n.  8. Diovan 40 mg p.o. q.day.  9. Doxepin 125 mg p.o. q.h.s.  10.Epipen p.r.n. bee stings.  11.Glipizide 10 mg p.o. q.day  12.HCTZ 25 mg p.o. b.i.d.  13.Lasix 80 mg p.o. b.i.d.  14.Lexapro 10 mg p.o. q.day.  15.Metformin 500 mg p.o. b.i.d.  16.Potassium chloride 60 mEq q.a.m. and q.h.s. and 40 mEq q.noon.  17.Prempro 0.3/1.5 mg p.o. q.day.  18.Ranitidine 300 mg p.o. q.day.  19.Prilosec OTC one p.o. b.i.d.  20.Spironolactone  50 mg p.o. q.day.  21.Zocor 10 mg p.o. q.day.  22.Ditropan 5 mg p.o. b.i.d.  23.Aspirin 81 mg p.o. q.day.  24.Prednisone 10 mg p.o. q.day.  25.Methotrexate 7.5 mg b.i.d. q.Saturday.   ALLERGIES:  1. DILANTIN.  2. VALIUM.  3. ERYTHROMYCIN.  4. PENICILLIN.  5. CODEINE.  6. HALDOL.  7. LIBRIUM.  [place].   PATIENT ALSO HAS MULTIPLE OTHER ALLERGIES LISTED, HOWEVER SHE HAS TAKEN  THESE MEDICATIONS RECENTLY WITHOUT PROBLEMS.   REVIEW OF  SYSTEMS:  Pertinent positives include increased sleepiness and  right leg pain which is burning in qualities.  Patient's last bowel  movement was yesterday and was normal.  Patient denies constipation,  diarrhea, bright red blood per rectum, nausea, vomiting or shortness of  breath.  Remainder of review of systems is per HPI.   PHYSICAL EXAMINATION:  VITAL SIGNS:  Vitals just prior to transfer to  [facility] reveal temperature 98.2, blood pressure 139/75,  pulse 112, respiratory rate 22, O2 sat 97%, but it is unclear on what  strength of oxygen she was getting via nasocannula.  GENERAL:  Patient is sleepy and falls asleep repetitively during her  questioning and exam.  Occasionally, she will cry out in pain which she  states is secondary to her right leg.  HEENT:  Pupils are sluggish, but reactive and equal to light; they are  not pinpoint in size.  Mucous membranes appear dry.  NECK:  Supple.  CARDIOVASCULAR:  Reveals regular rate and rhythm to slightly tachycardic  rate, but no murmurs, rubs or gallops.  PULMONARY:  Clear to auscultation bilaterally without wheezes, rales or  rhonchi.  ABDOMEN:  Obese, nontender, nondistended, soft with normoactive bowel  sounds.  EXTREMITIES:  Reveal right knee with a knee brace in place.  Incision  under the right knee brace and dressing is clean, dry and intact without  any signs or symptoms of infection.  Chronic venous stasis changes are  present in the lower extremities bilaterally.  There are 2+ pulses in  all extremities.  There is no cyanosis, clubbing or edema appreciated.  RECTAL EXAM:  Reveals external hemorrhoids, but normal tone.  NEUROLOGIC EXAM:  Patient is alert and oriented x3, though she appears  quite sleepy.  Her neurologic exam is nonfocal.   LABS:  Sodium 140, potassium 2.3, chloride 99, bicarb 31, BUN 16,  creatinine 0.82, glucose 170.  White blood cell count 10.2, hemoglobin  11.3, hematocrit 32.6, platelets 383  with 79% neutrophils and an  absolute neutrophil count of 8.  Urine drug screen was positive for  opiates.  Urinalysis revealed moderate leukocyte esterase, few  epithelials, 11-20 white blood cells and many bacteria.  ABG revealed pH  7.502, PCO2 40.2, PO2 77.8 and bicarb 31.2 on an FiO2 of 28% which is  two liters nasocannula.  Alcohol level was less than 5.  Total bilirubin  was 1, AST was slightly elevated at 46, ALT was slightly elevated at  101, alkaline phosphatase was 69, albumin 3.0 and calcium 9.4.   STUDIES:  CT of the head was negative and EKG showed  sinus tachycardia  with a right bundle-branch block.   ASSESSMENT:  [name] is a 60 year old white female with multiple  medical problems who presented with altered mental status, status post  right knee replacement surgery in the past several days.  Also, she has  a question of a urinary tract infection.   PLAN:  1. Altered mental status.  Patient's altered mental status is felt      likely to be secondary to opiate overdose given that she had      inappropriate response to Norocaine.  We will monitor her closely.      We will avoid using opiates during this hospital stay.  We will      repeat the neurologic exam when the patient is more alert and able      to cooperate better, however it appears nonfocal at this point.      Patient certainly is on a significant list of medications so it      certainly possible that her medications are contributory to her      altered mental status, however the only recent change was the      addition of the pain medication after her surgery.  For this      reason, it is felt somewhat unlikely that her chronic medications      are the cause of her altered mental status.  CT of the head was      negative.  We will continue to monitor her closely.  2. Question of urinary tract infection.  There is some question based      on her urinalysis whether or not the patient has a urinary tract       infection.  Patient certainly is at increased risk for urinary      tract infection having had recent surgery and a Foley during that      surgery.  For this reason, we will repeat a urinalysis and get a      urine culture.  These should be accurate, as patient has not been      given any antibiotics since admission.  Certainly, if a urinary      tract infection is present, this could contribute to an altered      mental status.  3. Hypoxia.  Patient certainly has a history of some underlying lung      disease although it is questionable whether or not there is an      asthma component versus a chronic obstructive pulmonary disease      component.  With the patient being somewhat tachycardic and      somewhat hypoxic, and given recent surgery and use of estrogen, we      will consider pulmonary embolism as a possibility, however      probability at this time is intermediate to low.  We will obtain a      D-dimer to help [place] rule this out and we will get cardiac enzymes x1      and a chest x-ray to further evaluate.  4. Hypokalemia.  Patient has a longstanding history of significant      hypokalemia and during her last hospital stay, she was actually      with potassium 2.0.  She is status post 60 mEq in the [facility] Emergency Department.  We will recheck a basic metabolic      panel this morning to see if her potassium improved  appropriately.      We will also get a magnesium level to ensure that this is within      normal limits, as potassium can be difficult to correct without a      normal magnesium level.  We will restart her home regimen of K-Dur.  5. Hypertension.  Currently, she is stable.  We will continue her home      medications as listed above and monitor her blood pressure closely.  6. Pain control.  We will avoid opioids at this point.  GIVEN HER      MULTIPLE ALLERGIES, we will be careful with our choice of pain      medication and given her slightly  elevated LFTs, we will avoid      Tylenol at this point.  She was given Toradol x1 in the emergency      department with a moderate response; therefore, we will continue      this q.6 hours x2 more doses and defer to the primary team for      further management.  7. Schizophrenia.  We will continue her home medications.  Currently,      she is stable and does not appear to be actively hallucinating or      psychotic.  We will continue to monitor her status, however.  8. Lung disease.  We will continue her inhalers per her outpatient      regimen.  9. Gout.  We will continue her allopurinol and colchicine per her      outpatient regimen.  10.Diabetes.  We will continue her glipizide and metformin.  We will      consider starting sliding-scale insulin and get regular CBG  checks      to further evaluate her blood glucose levels, as certainly      significant changes in blood glucose can result in altered mental      status.  However, at this point, it is felt unlikely that her      mental status problems were secondary to hypo or hyperglycemia.  11.Gastroesophageal reflux disease.  We will continue her H-2 blocker      and proton pump inhibitor per her outpatient regimen.  12.Hyperlipidemia.  We will continue her Zocor.  13.Osteoarthritis versus rheumatoid arthritis.  We will continue her      methotrexate which has next dose due on Saturday.  We will also      continue her daily prednisone.  14.Menopause.  We will hold her Prempro at this time given concern for      possible deep vein thrombosis and subsequent pulmonary embolism.  15.Fluids, electrolytes, nutrition.  We will start one-half normal      saline with 20 mEq of KCL at 125 ml an hour, as patient appears      somewhat dehydrated.  We will start her on a carbohydrate-modified      diet as her mental status tolerates.  We will check a repeat basic      metabolic panel to further evaluate her potassium level this      morning.   16.Prophylaxis.  Patient does have a Hemoccult-positive on exam;      therefore, we will cautiously start Lovenox if her repeat CBC is      stable this morning for deep vein thrombosis prophylaxis.      Patria Mane, MD  Electronically Signed     SA/MEDQ  D:  05/17/2007  T:  05/17/2007  Job:  [id number] 

## 2010-12-29 NOTE — H&P (Signed)
[name], [name]              ACCOUNT NO.:  [id number]   MEDICAL RECORD NO.:  [id number]          PATIENT TYPE:  INP   LOCATION:  NA                           FACILITY:  [facility]   PHYSICIAN:  Pietro Cassis. Alvan Dame, M.D.  DATE OF BIRTH:  11/23/1950   DATE OF ADMISSION:  07/11/2007  DATE OF DISCHARGE:                              HISTORY & PHYSICAL   PROCEDURE:  Left total knee arthroplasty.   CHIEF COMPLAINT:  Left knee pain.   HISTORY OF PRESENT ILLNESS:  A 60 year old female with a history of left  knee pain secondary to osteoarthritis.  It has been refractory to all  conservative treatment.  She does have a history of a right total knee  replacement and she has done very well.  She was presurgical assessed by  Dr. Hurley Cross.   PAST MEDICAL HISTORY:  1. Rheumatoid arthritis.  2. Right total knee replacement.  3. Diabetes.  4. Schizophrenia.  5. Reflux disease.  6. Asthma.  7. Chronic hypokalemia.   PAST SURGICAL HISTORY:  Significant for right total knee replacement.   FAMILY HISTORY:  Diabetes, arthritis.   SOCIAL HISTORY:  Primary caregiver after surgery will be [name].   DRUG ALLERGIES:  1. VALIUM.  2. THORAZINE.  3. LIBRIUM.  4. HALDOL.  5. PENICILLIN.  6. ERYTHROMYCIN.  7. SULFA DRUGS.  8. ASPIRIN.  9. ADVIL.  10.DILANTIN.  11.CODEINE.   MEDICATIONS:  1. Prempro 0.3-1.5 mg 1 tablet daily.  2. Spironolactone 50 mg 1 tablet daily.  3. Lexapro 10 mg 1 tablet daily in the morning.  4. Metformin 500 mg 1 tablet two times a day, noon and night.  5. Darvocet-N 100 one p.o. q.6h. at bedtime only.  6. Colchicine 0.6 mg 1 tablet two times daily, morning and night.  7. Diovan 40 mg 1 tablet daily in the morning.  8. Furosemide 80 mg 1 tablet two times a day, morning and afternoon.  9. Clonidine 0.2 mg 2 tablets at bedtime.  10.Oxybutynin chloride 5 mg 1 tablet two times a day, morning and      bedtime.  11.Hydrochlorothiazide 25 mg 1 tablet two times  a day, morning and      afternoon.  12.Nexium 40 mg one capsule b.i.d.  13.Simvastatin 10 mg 1 tablet p.o. daily, morning.  14.Doxepin 100 mg one capsule at bedtime.  15.Doxepin 25 mg one p.o. at bedtime.  16.Glipizide ER 10 mg 1 tablet daily, afternoon.  17.Ranitidine 300 mg 1 tablet at bedtime.  18.Potassium chloride 20 mEq 2 tablets three times a day.  19.Albuterol 90 mcg inhaler 1 puff four times a day.  20.Benzonatate 100 mg p.o. three times a day.  21.Allopurinol 300 mg 1 tablet daily.  22.Methotrexate 2.5 mg 5 tablets on Saturday, 5 tablets on Sunday.  23.Amitriptyline 25 mg 1 tablet at bedtime.  24.Advair 250/50 mcg 1 puff two times a day.   REVIEW OF SYSTEMS:  Skin has bilateral history of cellulitis of her  lower extremities, previously was treated with Keflex.  Otherwise, see  HPI.   PHYSICAL EXAMINATION:  VITAL SIGNS:  Pulse  84, respirations 18, blood  pressure 114/76.  GENERAL:  Awake, alert and oriented, well-developed, well-nourished, no  acute distress.  NECK:  Supple.  No carotid bruits.  CHEST/LUNGS:  Clear to auscultation bilaterally.  BREASTS:  Deferred.  HEART:  Regular rate and rhythm without gallops, clicks, rubs or  murmurs.  ABDOMEN:  Soft, nontender, nondistended.  Bowel sounds present.  GENITOURINARY:  Deferred.  EXTREMITIES:  Right lower extremity:  Well-healed incision.  Excellent  range of motion, 0 to approximately 100.  Left knee:  She does have mild  flexion contracture.  Dorsalis pedis pulse positive.  SKIN:  No significant cellulitic changes of left lower extremity.  NEUROLOGIC:  Intact distal sensibilities.   LABORATORY:  Her labs EKG, chest x-ray pending presurgical testing.   IMPRESSION:  1. Rheumatoid arthritis.  2. Diabetes.  3. Schizophrenia.  4. Reflux.  5. Asthma.  6. History right total knee replacement.   PLAN OF ACTION:  Left total knee arthroplasty.  Risks and complications  were discussed.  Questions were encouraged,  answered and reviewed.   The patient does have Lovenox 30 mg subcu 15 injections remaining, which  will be using for prophylaxis.  Robaxin, over-the-counter iron, Colace,  MiraLax provided at time of history and physical as well.  Pain  medications will be provided at time of surgery.     ______________________________  [name], [place]      Pietro Cassis. Alvan Dame, M.D.  Electronically Signed    BLM/MEDQ  D:  07/05/2007  T:  07/05/2007  Job:  [id number]   cc:   [name], M.D.  [place]  [place] [address]

## 2010-12-29 NOTE — Consult Note (Signed)
[name], [name]              ACCOUNT NO.:  [id number]   MEDICAL RECORD NO.:  [id number]          PATIENT TYPE:  INP   LOCATION:  3309                         FACILITY:  [facility]   PHYSICIAN:  Ernst Spell, MD DATE OF BIRTH:  May 26, 1951   DATE OF CONSULTATION:  05/21/2007  DATE OF DISCHARGE:                                 CONSULTATION   CONSULTING PHYSICIAN:  Dr. Mayra Neer.   REASON FOR CONSULTATION:  Altered mental status/acute stroke call.   HISTORY OF PRESENT ILLNESS:  The patient is a 60 year old woman with a  long past medical history, including schizophrenia, diabetes,  hypertension, hyperlipidemia and depression, who was admitted on May 17, 2007 for increasing sleepiness and somnolence.  The patient had a  knee replacement done 3 days prior to this and was getting narcotics for  pain management and it was felt that her altered mental status was  secondary to over medication. She did seem to improve with Narcan.  The  patient's history of schizophrenia apparently was not very well  controlled.  She was having significant hallucinations and was started  on Zyprexa during this hospitalization. She was doing well today and was  noted to be her normal self at 6:30 p.m.  At 7:30 p.m., the nurse went  into the room and found her unresponsive, lying in the bed.  She would  not respond to pain.  A code stroke was called at that time.  She was  sent down for a head CT scan, which was reviewed personally and is  negative for any acute changes.  The patient was evaluated following her  CT scan.  She continues to be unresponsive.   The patient's Zyprexa was actually not yet given today.  Other  medication changes, over the past couple of days, include a couple of  doses of doxepin and Ditropan being held.  Otherwise, she has not been  getting any other narcotics.   HOSPITAL MEDICATIONS:  Include colchicine, allopurinol, multivitamin,  calcium carbonate, Zocor,  aspirin 81 mg daily, prednisone 10 mg daily,  Ditropan 5 mg b.i.d., Protonix, Advair, Clonidine patch, Avapro,  Glucotrol, Lexapro 10 mg daily, Glucophage, Premarin, Provera, Toradol,  Lovenox, doxepin 50 mg q.h.s., Zyprexa 5 mg daily and albuterol p.r.n.   ALLERGIES:  INCLUDE:  1. DILANTIN.  2. VALIUM.  3. ERYTHROMYCIN.  4. PENICILLIN.  5. CODEINE.  6. HALDOL.  7. LIBRIUM.  8. THORAZINE.  9. AS WELL AS MULTIPLE OTHER ALLERGIES LISTED PER HER [name].   PAST MEDICAL HISTORY:  Includes:  1. Type 2 diabetes mellitus.  2. History of peripheral neuropathy.  3. History of schizophrenia.  4. Chronic venous insufficiency.  5. Gastroesophageal reflux disease.  6. Osteoarthritis versus rheumatoid arthritis.  7. History of hypertension.  8. Hyperlipidemia.  9. Gout.  10.Depression.  11.Obesity.  12.Urge incontinence.  13.Mild chronic obstructive pulmonary disease.  14.Persistent hypokalemia.  15.History of nephrolithiasis.  16.History of Barrett esophagus.  17.History of right knee replacement approximately 1 week.  18.Appendectomy.  19.Cholecystectomy.  20.Bladder stretching.   FAMILY HISTORY:  Obtained via chart review.  History of  lung and bone  cancer in her parents.  Postoperative family history of coronary artery  disease and diabetes.   OBJECTIVE:  Obtained via chart review.  The patient quit smoking in  1973.  Denies alcohol or drugs on admission.  She lives with a niece who  helps her with her medications.   REVIEW OF SYSTEMS:  This is unable to be obtained through the patient  because of her mental status.  She is reported she has not had any  significant changes in her vital signs recently.  She has been afebrile  with a T-Max of 99 over the past few days.  Otherwise, the remainder of  the other systems was unable to be obtained.   PHYSICAL EXAMINATION:  GENERAL:  Moderately obese woman, in bed,  unresponsive, but in no acute distress.  VITAL SIGNS:  Temperature  99.9.  Blood pressure 157/86.  Pulse 105.  O2  saturation 95% on room air.  HEENT:  Head is normocephalic, atraumatic.  Oropharynx is moist.  There  is no obvious bleeding or injury to her tongue or her gums.  CARDIOVASCULAR EXAM:  She had regular, rate and rhythm.  She is mildly  tachycardic.  LUNGS:  Clear to auscultation bilaterally.  No carotid bruits were  auscultated.  ABDOMEN:  Soft and nondistended.  EXTREMITIES:  Demonstrated mild ankle edema bilaterally.  She has a knee  brace over the right knee.  NEUROLOGICAL EXAM:  She was unresponsive to voice.  Her eyes would open  spontaneously and she would have spontaneous eye movements, although did  not track on command.  She was nonverbal.  On cranial nerve exam, her  pupils were equal, round and reactive to light.  She did blink to  threat.  Doll's eyes seemed intact, although was somewhat difficult.  She did have spontaneous eye movements in all directions.  Corneales  were intact.  Face was symmetric.  Hearing was unable to be determined.  Intact gag, although was somewhat difficult to get.  On her motor exam,  the patient has normal tone.  I did not appreciate any significant  rigidity or cogwheeling.  She had purposeful movement in her bilateral  upper extremities and occasional movement in her left lower extremity.  She had decreased movement in her right extremity, likely due to her  recent surgery.  She did not have any focal weakness.  Sensory exam, she  was not responding to deep pain or pressure in her upper extremities,  although did withdraw to pain in her lower extremities.  Her reflexes  were somewhat brisk, but symmetric in all of her extremities.  Her toes  were downgoing bilaterally.  Coordination was unable to be examined.   I reviewed the patient's head CT, personally, and there was no acute  changes.  Her ABG was done at the bedside and was within normal limits.   IMPRESSION:  A 60 year old woman with multiple  medical problems,  including history of schizophrenia, as well as multiple stroke risk  factors, who was admitted with altered mental status, including  somnolence, as well as hallucinations, which seem both to have responded  now with more acute onset of unresponsiveness.  The patient, otherwise,  has a nonfocal neurological exam.  I do not appreciate any significant  signs of neuroleptic malignant syndrome, including no fluctuations in  her vital signs and no significant rigidity on her exam currently.  However, would want to monitor for this carefully and I would hold her  Zyprexa over night and have psychiatry evaluate her in the morning.  Other considerations would include seizure as Zyprexa can lower seizure  threshold.  Also consider infection/encephalitis, although the acute  change in her mental status seems somewhat atypical.  It seems unlikely  to be an ischemic event because of her nonfocal exam.   At this time, I would recommend q.2 hour neuro checks over night.  Obtain labs, including CK, LDH, as well as chemistry, CBC and other  infectious workup as necessary.  I would recommend checking an EEG in  the morning.  If her mental status has not improved at all, I would  consider a lumbar puncture and possibly an MRI of the brain to evaluate  for encephalitis and to completely rule out stroke.  We will followup on  this patient tomorrow.  It was a pleasure seeing Ms. Wendorff in  consultation this evening.  Please do not hesitate to call with any  additional concerns.      Ernst Spell, MD  Electronically Signed     DEE/MEDQ  D:  05/21/2007  T:  05/22/2007  Job:  [id number]

## 2010-12-30 ENCOUNTER — Other Ambulatory Visit: Payer: Self-pay | Admitting: Family Medicine

## 2010-12-30 MED ORDER — HYDROCODONE-ACETAMINOPHEN 5-325 MG PO TABS: 1.0000 | ORAL_TABLET | Freq: Four times a day (QID) | ORAL | Status: DC | PRN

## 2011-01-01 NOTE — Op Note (Signed)
[name], Cross              ACCOUNT NO.:  [id number]   MEDICAL RECORD NO.:  [id number]          PATIENT TYPE:  AMB   LOCATION:  ENDO                         FACILITY:  [facility]   PHYSICIAN:  Lear Ng, MDDATE OF BIRTH:  1950/11/15   DATE OF PROCEDURE:  03/24/2006  DATE OF DISCHARGE:                                 OPERATIVE REPORT   PROCEDURE:  Colonoscopy.   INDICATIONS:  History of polyps.   MEDICATIONS:  Fentanyl 100 mcg IV, additional medicines given for preceding  EGD.   FINDINGS:  Rectal exam was normal.  An adult adjustable colonoscope was  inserted into a fair-prepped colon and advanced to the cecum, where the  ileocecal valve and appendiceal orifice were identified.  In order to reach  the cecum, the patient had to be turned in the supine position and back in  the left lateral decubitus position and abdominal pressure had to be applied  in various locations to reach the cecum.  On careful withdrawal, the  colonoscope revealed no mucosal abnormalities, although the fair prep may  have precluded visualization of small polyps.  Retroflexion was  unremarkable.   ASSESSMENT:  Normal colonoscopy.   PLAN:  Repeat colonoscopy in 5 years.      Lear Ng, MD  Electronically Signed     VCS/MEDQ  D:  03/24/2006  T:  03/24/2006  Job:  [id number]   cc:   Jessy Oto. Oneida Alar, MD  Wolfgang Phoenix Oneida Alar, M.D.

## 2011-01-01 NOTE — H&P (Signed)
NAMEADDALINE, GOCH NO.:  [id number]   MEDICAL RECORD NO.:  [id number]          PATIENT TYPE:  INP   LOCATION:  2916                         FACILITY:  [facility]   PHYSICIAN:  Dixon Boos, M.D.       DATE OF BIRTH:  [birth year]   DATE OF ADMISSION:  06/25/2006  DATE OF DISCHARGE:                                HISTORY & PHYSICAL   CHIEF COMPLAINT:  Wheezing and trouble breathing.   HISTORY OF PRESENT ILLNESS:  [name] is a 60 year old white female with  multiple medical problems.  She was actually seen in clinic by me last week  for a followup visit.  At that time she complained of a nonproductive cough  with no systemic symptoms at the time.  She reports that over the past 2  weeks or so her cough has been worsening and she states that she started  wheezing this evening.  Her cough remains nonproductive and she has not had  any fevers.  Her appetite remains very good.  She has never had an asthma or  a COPD exacerbation before, and has never had to be hospitalized for her  breathing.  She does use Advair discus twice daily, as well as albuterol  inhaler as needed for shortness of breath.  In the emergency department her  respiratory rate was increased and she was noted to have increased work of  breathing.  She received an albuterol nebulizer twice, as well as Atrovent  once.  She also received methylprednisolone 125 mg IV.  According to the  patient and her niece, who is with her, her breathing is much improved since  her original presentation to the emergency department.  [name] also  complains of rib pain diffusely in her chest that is worsened with cough  and inspiration.   REVIEW OF SYSTEMS:  CONSTITUTIONAL:  No fever, good appetite.  PULMONARY:  Positive for cough, shortness of breath, and rib pain with cough.  GI is  negative for nausea, vomiting, and diarrhea.  Skin review of systems is  negative.   PAST MEDICAL HISTORY:  1. Hypercholesterolemia.  2. Obesity.  3. Schizophrenia,  according to the patient.  4. Benign systemic hypertension.  5. Asthma/COPD, no formal pulmonary function tests have been performed.  6. Reflux esophagitis.  7. Menopausal syndrome.  8. Diffuse osteoarthritis.  9. Chronic hypokalemia, her potassium runs about 2.8 in clinic.  10.Lower extremity edema.  11.Urge incontinence.  12.Gout.  13.Chronic pain.  14.Chronic renal insufficiency.  15.Tachycardia with right bundle branch block on EKG, evaluated by Dr.      Doylene Canard in September 2007.  16.Diabetes mellitus type 2.  17.Depression.  18.Amenorrhea since the age of 26.  13.Nephrolithiasis in February 2003.  20.Status post cholecystectomy.  21.Status post appendectomy.  22.The patient reports that she had a stroke at age 84; however, the      patient's niece says that this is not accurate.   MEDICATIONS:  1. Advair discus 250/50 one inhalation twice daily.  2. Albuterol metered dose inhaler as needed.  3. Allopurinol 300 mg b.i.d.  4. Clonidine  0.4 mg q.h.s.  5. Colchicine 0.6 mg b.i.d.  6. Darvocet 2 tablets daily.  7. Doxepin 125 mg q.h.s.  8. Lasix 80 mg b.i.d.  9. Glipizide ER 10 mg p.o. daily.  10.Hydrochlorothiazide 25 mg p.o. b.i.d.  11.Potassium chloride 60 mEq t.i.d.  12.Lexapro 10 mg p.o. daily.  13.Lisinopril 10 mg p.o. daily.  14.Multivitamin daily.  15.Nexium 40 mg b.i.d.  16.Oxybutynin 5 mg p.o. b.i.d.  17.Prednisone 10 mg daily.  18.Prempro 0.3/1.5 one tablet daily.  19.Ranitidine 300 mg q.h.s.  20.Spironolactone 50 mg p.o. daily.  21.Zocor 10 mg at night.   ALLERGIES:  The patient reports an extensive allergy list including:  1. VALIUM.  2. SULFA.  3. PENICILLIN.  4. ERYTHROMYCIN.  5. CODEINE.  6. Several others.   FAMILY HISTORY:  There is a strong family history of diabetes, coronary  artery disease, and kidney problems.  One of her sisters died of colon  cancer.  One brother died with lung cancer.  Her father died  of black lung,  and her mother died with bone cancer.   SOCIAL HISTORY:  [name] was born in [place] but grew up most of her  life in [place].  She was adopted and does not have any children.  She currently lives with her sister and her niece, who helps care for her.  She has been on disability secondary to schizophrenia.  She did smoke for 1  year in 1973, but has not smoked since.  She denies any alcohol or drug use.   VITAL SIGNS:  Temperature 97.3.  Blood pressure 161-184/88-95.  Respiratory  rate of 32/38.  Heart rate is 119-120.  O2 sats are 95% on room air.   PHYSICAL EXAM:  GENERAL:  The patient appears in no acute distress while  sleeping.  She is alert and oriented x3.  HEENT:  Atraumatic and normocephalic.  Extraocular movements are intact and  sclerae are white.  She has extremely dry mucous membranes, and an  erythematous pharynx that is without exudate.  She has poor dentition.  CHEST:  Her chest is diffusely tender to palpation.  LUNGS:  There are diffuse expiratory wheezes with an increased respiratory  rate but no neck retraction.  HEART:  Tachycardic, S1, S2 auscultated without any murmur.  Her dorsalis  pedis pulses are 2+ bilaterally.  ABDOMEN:  Obese, normoactive bowel sounds, soft, nontender, nondistended,  and no masses.  EXTREMITIES:  No edema.  CRANIAL NERVES:  Cranial nerves II-XII are grossly intact.  SKIN:  Warm and dry without rash.   LAB RESULTS:  Sodium is 135.  Potassium 2.7.  Chloride 105.  Bicarb 30.6.  BUN 22.  Creatinine 1.0.  Glucose 119.  White count is 7.3  Hemoglobin is  15.2.  Platelets are 185.  A BNP is less than 30.  Point-of-care cardiac  enzymes are negative.  Chest x-ray shows a low lung volume but no acute disease.  An EKG shows sinus tachycardia with PVCs and a right bundle branch block.  Of note, the patient's EKG in clinic last month also had a right bundle  branch block.  ASSESSMENT:  This is a 60 year old white  female with the history of  hypertension, diabetes, COPD/asthma, and multiple other medical problems.  She presents to the ED with a 1-day history of wheezing and breathing  difficulty that has responded quite well to albuterol and Atrovent  nebulizers   PLAN:  1. Lungs:  The patient's breathing difficulty is likely  due to a      COPD/asthma exacerbation, although she has never actually had formal      PFTs to document obstruction.  Her chest x-ray shows no pneumonia, her      white cell count is normal, and she is afebrile; therefore, there is no      indication of any infectious process.  Given that her breathing has      improved with bronchodilators in the ED, we will start her on albuterol      and Atrovent nebulizers every 2 hours with every 1 hour p.r.n.  We will      also continue prednisone 40 mg daily for 3-5 days.  Of note, she does      take 10 mg prednisone daily at home for her joint pain.  She does not      have any true risk factors for PE.  2. Tachycardia:  The patient's EKG is essentially unchanged from her EKG      last month in clinic.  There is no acute ST change or T-wave inversion.      She has had a workup for her tachycardia which is in the 110s at      baseline.  Her TSH in clinic was normal.  She was referred to Dr.      Doylene Canard and had a Cardiolite and stress test that were both normal,      according to the patient.  Albuterol may also be driving up her heart      rate as well at this time.  For now we will bolus her with 500 mL,      since she is clinically dry, and continue to monitor her heart rate.  3. Diabetes mellitus type 2:  We will start her home dose of glipizide, as      well as sliding scale insulin.  There is some question as to whether      her diabetes is actually steroid induced.  Her hemoglobin A1c has been      less than 7 in clinic, I believe.  Therefore, we will not check her      hemoglobin A1c while in the hospital.  4. Hypertension:   The patient's blood pressure has been well-controlled in      clinic.  However, her blood pressure is elevated while in the emergency      department, likely secondary to respiratory distress.  We will start      her home dose of clonidine, hydrochlorothiazide, Lasix, and      spironolactone.  Of note, we may have to increase her lisinopril dose      in order to help with her chronic hypokalemia.  Lisinopril is a new      medication for her as of 2 months ago.  5. Hypokalemia:  The patient's potassium chronically runs between 2.8 and      3 in clinic.  She is on 60 mEq of potassium chloride 3 times daily.      Her low potassium is likely due to her high-dose Lasix and      hydrochlorothiazide.  She has been repleated in the emergency      department and we will follow up with a basic metabolic panel in the      morning.  We will also check her magnesium level in the morning.  6. Edema:  The patient has a history of diffuse lower extremity edema that  has actually been improved recently.  She is on hydrochlorothiazide and      Lasix, and is adamant in clinic about not changing her dosage of her      diuretics.  The patient's electronic medical record in clinic also      indicates not to adjust her diuretic dose, as it has previously      resulted in her having to be admitted to the hospital.  She does not      have any documented congestive heart failure.  The cause of her lower      extremity edema is unknown; however, it may be related to her chronic      steroid use.  7. Gout/joint pain:  The patient was recently evaluated by Dr. Charlestine Night.      He was unsure if her joint pain is truly gout, as there was no effusion      on joint x-ray.  He was supposed to follow her in approximately 2 weeks      after her last visit.  For the time being he has continued her home      medications for gout which include allopurinol, colchicine and      prednisone.  8. 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  She has been on doxepin for many years.      She is adamant about not stopping this medication, as she has been      hospitalized to mental health many years ago after stopping this      medicine.  I have planned to discuss stopping her Lexapro in clinic in      order to help reduce polypharmacy.  For now we will continue both her      doxepin and Lexapro while in the hospital.  9. Code status:  The patient wishes to be full code, which includes      intubation and chest compressions if necessary.           ______________________________  Dixon Boos, M.D.     MJ/MEDQ  D:  06/26/2006  T:  06/26/2006  Job:  [id number]

## 2011-01-01 NOTE — Discharge Summary (Signed)
[name], Cross              ACCOUNT NO.:  [id number]   MEDICAL RECORD NO.:  [id number]          PATIENT TYPE:  OIB   LOCATION:  5729                         FACILITY:  [facility]   PHYSICIAN:  Early Chars. Wilburn Cornelia, M.D.DATE OF BIRTH:  1950/12/26   DATE OF ADMISSION:  DATE OF DISCHARGE:  09/17/2004                                 DISCHARGE SUMMARY   ADMISSION DIAGNOSES:  1.  Right lateral tongue lesion.  2.  Reactive airway disease.  3.  Schizophrenia.   DISCHARGE DIAGNOSES:  1.  Right lateral tongue lesion.  2.  Reactive airway disease.  3.  Schizophrenia.   SURGICAL PROCEDURES:  Wide local excision, right lateral tongue lesion  performed on September 16, 2004.   DISCHARGE CONDITION:  Stable condition, in the company of her niece.   DISCHARGE MEDICATIONS:  1.  Preoperative medications.  2.  Levaquin 500 mg p.o. daily for 10 days.  3.  Percocet 5/325, one to two p.o. q.4-6h. p.r.n. pain, dispense #30      without refills.   DISCHARGE INSTRUCTIONS:  1.  The patient will avoid vigorous physical activity including lifting and      straining.  2.  Diet is restricted to liquid and soft diet as tolerated.  3.  Wound care:  Half-strength hydrogen peroxide.  4.  Mouth rinse, twice daily basis.   FOLLOWUP:  The patient will follow up in my office next week for outpatient  postoperative care.   BRIEF HISTORY:  Ms. [name] is a 60 year old white female who was referred  from [facility] for evaluation of a progressive right  lateral tongue lesion.  The patient noted an approximately two-month history  with gradual enlarging lesion involving the right posterolateral tongue.  No  prior history of cigarette smoking and no other upper digestive tract  symptoms, and no evidence of cancers or tumors.  Given the patient's  history, examination and findings, workup including biopsy was performed.  Findings were consistent with chronic inflammation.  MRI scanning  performed  showed ulcerated area along the right lateral tongue, and lingual tonsillar  hypertrophy as well as borderline normal lymphadenopathy bilaterally.  Given  the patient history and examination, I recommended we undertake wide local  excision of the lesion for pathologic diagnosis, and further workup.  The  risks, benefits, possible complications of the surgical procedure were  discussed in detail for the patient and her family, and surgery has been  scheduled as an inpatient at [facility] main OR.   HOSPITAL COURSE:  The patient was admitted to the ENT service under Dr.  Shanon Brow L. Shoemaker's care to [facility] on September 16, 2004.  She  was taken to the operating room at [facility] main OR and underwent  wide local incision of a right lateral tongue lesion.  The surgery was  performed under general anesthesia.  The patient was transferred from the  operating room to the recovery room in stable condition.   The patient was recovered uneventfully, and transferred from the recovery  room Unit 5700 for postoperative care.  On  the first postoperative morning,  the patient was tolerating a soft, oral diet and liquids without difficulty,  minimal discomfort.  No swelling and no airway compromise.  Overnight pulse  oximetry on four liters of nasal cannula oxygen was normal.  Oxygen was  discontinued on the morning of discharge, and her room air oxygen saturation  was 91% or better.   DISCHARGE CONDITION:  The patient is discharged home in stable condition in  the company of her family with the above discharge instructions.  At the  time of discharge, she had normal ambulation, normal bowel and bladder  function, and no postoperative complications or concerns.      DLS/MEDQ  D:  [id number]  T:  09/17/2004  Job:  [id number]

## 2011-01-01 NOTE — Op Note (Signed)
NAMESHARISE, Cross              ACCOUNT NO.:  [id number]   MEDICAL RECORD NO.:  [id number]          PATIENT TYPE:  OIB   LOCATION:  2550                         FACILITY:  [facility]   PHYSICIAN:  [name], M.D.DATE OF BIRTH:  Oct 10, 1950   DATE OF PROCEDURE:  DATE OF DISCHARGE:                                 OPERATIVE REPORT   PREOPERATIVE DIAGNOSIS:  Right lateral tongue lesion.   POSTOPERATIVE DIAGNOSIS:  Right lateral tongue lesion.   SURGICAL PROCEDURE:  Wide local excision, right lateral tongue lesion.   ANESTHESIA:  General endotracheal.   SURGEON:  [name], M.D.   COMPLICATIONS:  None.   ESTIMATED BLOOD LOSS:  Less than 50 mL.   DISPOSITION:  The patient was transferred from the operating room to the  recovery room in stable condition.   BRIEF HISTORY:  [name] is a 60 year old white female who was referred  for evaluation of a gradually enlarging ulcerated lesion along the right  posterolateral tongue.  The patient noted the lesion approximately 2 months  prior to presentation.  She had no history of cigarettes smoking or  significant trauma to that area.  The mass had failed to heal and biopsy was  performed under local anesthesia in the office.  This came back as chronic  inflammation.  Examination revealed an approximately 1 x 2 cm ulcerated mass  in the right lateral tongue associated with a moderate amount of discomfort  and otalgia.  MRI scan was obtained.  The patient had lingual tonsillar  hypertrophy, no other mass, ulcer, or lesion was noted within the upper area  of the digestive tract and she had borderline bilateral lymphadenopathy.   Given her history and examination, I recommended that we undertake wide  local excision of the mass for pathologic diagnosis.  The risks, benefits,  and possible complications of procedure were discussed in detail with the  patient and her family and they are understood and concurred with our plan  for surgery which was scheduled as above.   SURGICAL PROCEDURE IN DETAIL:  The patient was brought to the operating room  of [facility] [place] on September 16, 2004 and placed in the supine  position on the operating table.  General endotracheal anesthesia was  established without difficulty and when the patient was adequately  anesthetized, her oral cavity and oropharynx were examined.  She had a 1 x 2  cm ulcerated mass in the right lateral posterior tongue.  Using Bovie  electrocautery, a 1 cm cuff of normal tissue was demarcated around the  higher lesion.  The dissection was then carried through the mucosa and  underlying submucosa into the deep musculature of the tongue, dissected  using Bovie electrocautery throughout.  The entire lesion was removed with  an approximately 1 cm deep cuff of normal tissue and this was sent to  pathology for gross and microscopic evaluation.  Several areas of point  hemorrhage were cauterized using suction cautery and the patient's mouth was  then thoroughly irrigated with sterile saline solution.   The right lateral tongue excision site which  measured approximately 2 x 3 cm  was then closed with 3-0 Vicryl suture on a tapered needle in a vertical  mattressing fashion.  Multiple sutures were placed and the excision site was  well closed without swelling or erythema.  No active bleeding.  The  patient's oral cavity and oropharynx were then thoroughly irrigated and  orogastric tube was passed, stomach contents were aspirated.  The patient  was then awakened from anesthetic.  She was extubated and was transferred  from the operating room to the recovery room in stable condition.  There  were no complications.  The blood loss was less than 50 mL.      DLS/MEDQ  D:  [id number]  T:  09/16/2004  Job:  [id number]

## 2011-01-01 NOTE — Discharge Summary (Signed)
NAMELAKEYA, HARPENAU              ACCOUNT NO.:  [id number]   MEDICAL RECORD NO.:  [id number]          PATIENT TYPE:  INP   LOCATION:  [id number]                         FACILITY:  [place]   PHYSICIAN:  Dixon Boos, M.D.       DATE OF BIRTH:  06-Jul-1951   DATE OF ADMISSION:  06/25/2006  DATE OF DISCHARGE:  06/28/2006                               DISCHARGE SUMMARY   DISCHARGE DIAGNOSES:  1. Asthma versus chronic obstructive pulmonary disease exacerbation.  2. Hypercholesteremia.  3. Obesity.  4. Questionable history of schizophrenia.  5. Hypertension.  6. Reflux esophagitis.  7. Menopausal syndrome.  8. Chronic joint pains.  9. History of hypokalemia.  10.Urge incontinence.  11.Gout.  12.History of tachycardia.  13.Diabetes mellitus, type 2.  14.Depressive disorder.  15.History of pulmonary embolus.   DISCHARGE MEDICATIONS:  1. Advair Diskus 1 puff twice daily.  2. Albuterol inhaler 2 puffs every 4 hours as needed for dyspnea.  3. Allopurinol 30 mg b.i.d.  4. Clonidine 0.2 mg 2 tablets q.h.s.  5. Colchicine 0.6 mg b.i.d.  6. Darvocet 2 tablets p.o. daily.  7. Doxepin 125 mg q.h.s.  8. Lasix 80 mg p.o. b.i.d.  9. Glipizide 10 mg p.o. daily.  10.Hydrochlorothiazide 25 mg p.o. b.i.d.  11.Potassium chloride 3 tablets p.o. 3 times daily.  12.Lexapro 10 mg p.o. daily.  13.Lisinopril 20 mg p.o. daily.  14.Nexium 40 mg p.o. daily.  15.Oxybutynin 5 mg p.o. daily.  16.Predinisone 10 mg p.o. daily.  17.Prempro 1 tablet every other day.  18.Ranitidine 30 mg p.o. q.h.s.  19.Spironolactone 50 mg p.o. daily.  20.Zocor 20 mg p.o. daily.   CONSULTS:  None.   PROCEDURES:  The chest x-ray on admission showed no acute  cardiopulmonary disease.  A spiral CT of the chest on November 11 showed  no evidence of pulmonary embolus or acute findings in the chest.  There  was significant fatty liver change and a moderate-sized hiatal hernia.   FOLLOWUP INSTRUCTIONS:  Mrs. Nasuti has been  instructed to make a  followup appointment with her primary physician, Dr. Dixon Boos, at  [facility].  She will schedule the appointment in  approximately 1 to 2 weeks.  The patient would possibly benefit from  formal pulmonary function test as an outpatient in order to have a clear  diagnosis for her lung disease.   HOSPITAL COURSE:  Briefly, Mrs. Dragotta is a 60 year old white female  with a history of multiple medical problems.  She presented to the [facility] Emergency Department with a 1-day history of wheezing and breathing  difficulty.  She does have a history of asthma or COPD but has never had  formal PFTs for evaluation.  She is on albuterol and Advair Diskus at  home for her breathing difficulties.   1. Respiratory distress:  Likely secondary to COPD or asthma      exacerbation.  Chest x-ray showed no pneumonia.  Her white cell      count was normal, and she was afebrile.  Therefore, she was not      started on  any antibiotics.  Breathing status had improved with      some bronchodilator in the emergency department.  She was started      on albuterol and Atrovent nebulizers every 2 hours as well as a      short burst of prednisone 40 mg daily for 3 or 5 days.  Given the      fact that she does have a history of pulmonary embolus, a spiral CT      of the chest was obtained and was negative for pulmonary embolus.      Her O2 sats were 95% on room air upon admission, and her albuterol      nebulizer treatments had been spaced to 6 hours at the time of      discharge.  She had no oxygen requirement at the time of discharge      and was breathing much more comfortably; albeit, still above her      baseline.  2. Tachycardia:  The patient has been noted to have a baseline heart      rate of 100 to 120 in the clinic.  At the time of admission, her      heart rate was approximately 120.  An EKG showed sinus tachycardia      with PVCs and a right bundle branch  block which is not new and has      been noted on an EKG in the clinic.  She was initially bolused with      IV fluids since she was clinically dry on exam.  A TSH has been      obtained in the clinic and was normal.  Her heart rate may also be      driven up due to beta-agonist use.  She does have some chest pain      throughout admission that was worse with inspiration and cough.      The chest pain was reproducible on musculoskeletal exam and      relieved with Toradol.  She has been instructed to take ibuprofen      as an outpatient as needed for her pleuritic chest pain.  3. Diabetes mellitus, type 2:  The patient was continued on her home      glipizide as well as sliding scale insulin.  Hemoglobin A1c has      been less than 7 in the clinic; therefore, it was not checked while      in the hospital.  4. Hypertension:  The patient was started on her home blood pressure      medicines including clonidine, hydrochlorothiazide, Lasix, and      lisinopril.  Her lisinopril dose was increased this hospitalization      from 10 mg daily to 20 mg daily.  Hopefully, her home clonidine can      be stopped as an outpatient as her ACE inhibitor dose is titrated.  5. Hypokalemia:  The patient has a chronic history of a potassium that      runs between 2.8 and 3.0.  She was started on her home dose of      potassium.  Her hypokalemia is likely secondary to very high doses      of Lasix and hydrochlorothiazide.  Her potassium was repleted      several times throughout admission, and her magnesium level was      checked and was within normal limits.  6. Edema:  The patient has a chronic history of  diffused edema and has      been on high dose hydrochlorothiazide, Lasix, and spironolactone.      She is adamant in the clinic about not changing her dose and BNP      was less than 30 while in the hospital.  There has not been any     changes to her home diuretic dose.  7. Gout:  The patient was  continued on her home medicines including      allopurinol and colchicine.  This problem is stable.  8. Depression/schizophrenia:  The patient has been on doxepin for many      years and is adamant about not stopping it.  She has also recently      been started on Lexapro in early 2000, and I will discuss stopping      this medication as an outpatient as she denies any depressive      symptoms.           ______________________________  Dixon Boos, M.D.     MJ/MEDQ  D:  07/31/2006  T:  08/01/2006  Job:  [id number]

## 2011-01-01 NOTE — Op Note (Signed)
NAMESOUL, EVERIST              ACCOUNT NO.:  [id number]   MEDICAL RECORD NO.:  [id number]          PATIENT TYPE:  AMB   LOCATION:  ENDO                         FACILITY:  [place]   PHYSICIAN:  Lear Ng, MDDATE OF BIRTH:  March 02, 1951   DATE OF PROCEDURE:  03/24/2006  DATE OF DISCHARGE:                                 OPERATIVE REPORT   PROCEDURE:  Upper endoscopy.   INDICATION:  History of Barrett's esophagus.   MEDICATIONS:  Fentanyl 100 mcg IV, Phenergan 12.5 mg IV.   FINDINGS:  Endoscope was inserted into the oropharynx and esophagus was  intubated.  In the distal esophagus was one finger-like area of salmon-  colored mucosa from 30 cm to 34 cm concerning for Barrett's esophagus that  was biopsied multiple times.  The Z-line was also irregular.  Endoscope was  then advanced down to the stomach which was normal in its entirety.  Endoscope was advanced to the duodenal bulb which was normal as was the  second portion of the duodenum.   ASSISTANT:  1. Barrett's esophagus appearance - status post biopsies.  2. Irregular Z-line.   PLAN:  1. Follow up on path.  2. No aspirin x 14 days.  3. If Barrett's esophagus is again identified then repeat EGD in 2-3 years      if no dysplasia.      Lear Ng, MD  Electronically Signed     VCS/MEDQ  D:  03/24/2006  T:  03/24/2006  Job:  [id number]

## 2011-01-01 NOTE — Op Note (Signed)
NAME:  TAYLINN, HILLIGOSS                        ACCOUNT NO.:  [id number]   MEDICAL RECORD NO.:  [id number]                   PATIENT TYPE:  AMB   LOCATION:  ENDO                                 FACILITY:  [facility]   PHYSICIAN:  Raelyn Ensign. Vladimir Faster, M.D.            DATE OF BIRTH:  1950-12-17   DATE OF PROCEDURE:  12/04/2002  DATE OF DISCHARGE:  12/04/2002                                 OPERATIVE REPORT   REFERRING PHYSICIAN:  Peterson Ao B. Oneida Alar, M.D. and Elenor Quinones, M.D.   PROCEDURES:  24- hour esophageal manometry.   INDICATIONS:  A 60 year old female referred by Wolfgang Phoenix. Fields, M.D., and  Elenor Quinones, M.D., because of persisting reflux.  She previously has  had an upper endoscopy that revealed a 5 cm hiatal hernia from 35 cm to the  Z-line at 30 cm.  The diaphragm was at 35 cm.  In addition, she had  Barrett's esophagitis demonstrated at the proximal margin of this hiatal  hernia.  She has been on Nexium twice daily and Zantac at night without  relief of symptoms.  She has a constant sore throat and choking episodes at  night when she lies down.   ESOPHAGEAL MANOMETRY FINDINGS:  Esophageal manometry was performed in  standard fashion with a transnasal manometry probe.  The lower esophageal  sphincter was located between 33 and 35 cm from the nares, and the lower  esophageal sphincter pressure was low at 13.6 mmHg.  This is low normal.  There were peristaltic contractions throughout the body of the stomach and  appropriate relaxations of the lower esophageal sphincter.  The distal  esophageal amplitude was 60 mmHg, which is within the normal range.  The  lower esophageal sphincter relaxed 93%.   TWENTY-FOUR HOUR PH PROBE FINDINGS:  Despite being on the above therapy, the  patient had 128 proximal episodes of reflux of acid and 193 distal episodes.  Her upright time in reflux was 17% of the time.  Her recumbent time in  reflux was 15.7% of the time.  Her distal composite  Johnson-DeMeester score  was 89.6 with normal being less than 22.  Symptom correlation was not good,  but this is probably related to the patient's lack of reporting of  individual symptoms but only her overall sense of problematic reflux.   IMPRESSION:  A 5 cm hiatal hernia with borderline low lower esophageal  sphincter pressures.  The patient has known Barrett's, and her 24-hour pH  probe demonstrates ongoing reflux despite therapy.  She is a candidate for  hiatal hernia repair and fundoplication due to the above and her ongoing  symptoms.  She has adequate peristalsis to allow for good swallowing  following the surgical therapy.  One would wonder if she is really taking  her medicines, however, with the degree of documented acid reflux that she  has been having.  Raelyn Ensign. Vladimir Faster, M.D.    PJS/MEDQ  D:  12/13/2002  T:  12/14/2002  Job:  [id number]   cc:   Haywood Lasso, M.D.  [address], [place]  [place] [address]  Fax: [phone]   Wolfgang Phoenix. Fields, M.D.  [address] [place]  [place] [address]  Fax: [phone]   Elenor Quinones, M.D.  [facility]  [place], [place] [address]  Fax: [phone]

## 2011-01-12 IMAGING — CR DG CHEST 2V
1 series · 1 of 1 positions shown · non-contrast
Comparison: Chest radiograph 07/29/2010 and 09/16/2009.

CLINICAL DATA: Chest pain.  Cough.  Question pneumonia versus CHF.

CHEST - 2 VIEW

[view not recorded]
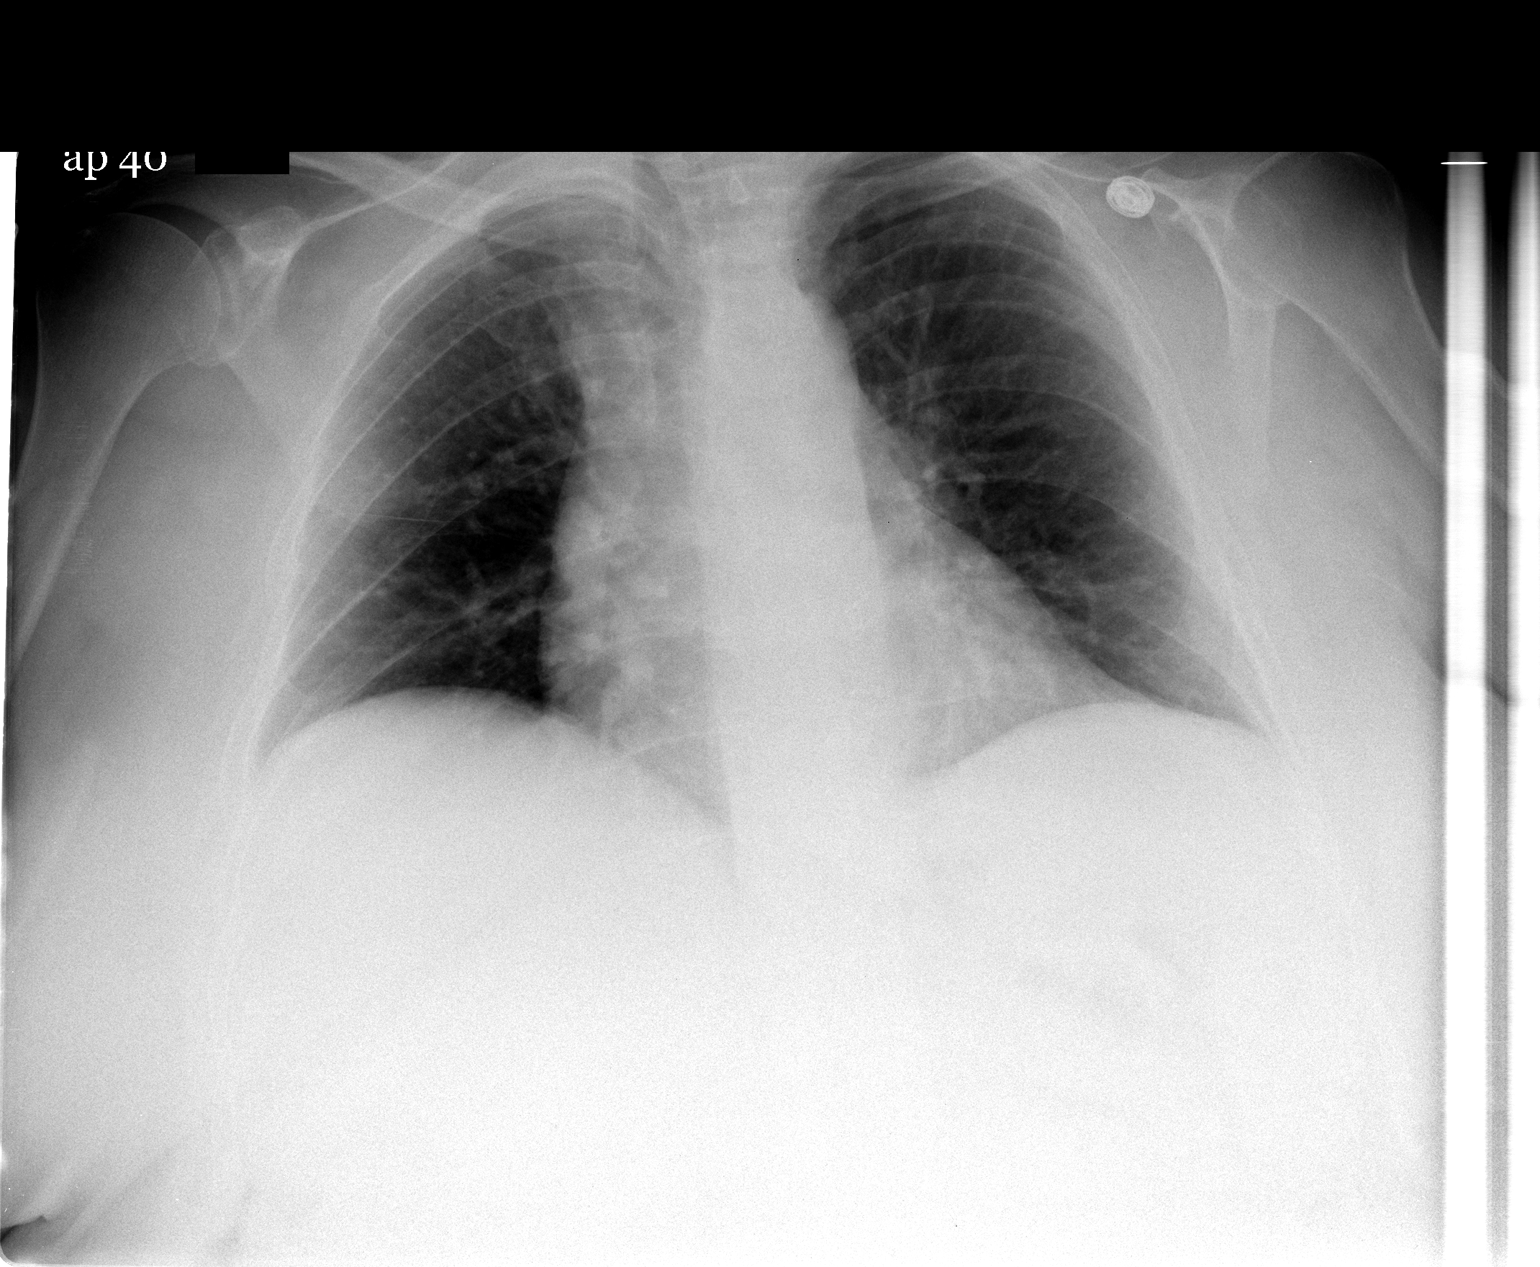

[1 of 1 positions shown; findings below may reference images not displayed]

FINDINGS: The heart and mediastinal contours are normal.  Normal
pulmonary vascularity.  The lung volumes slightly low, and the
lungs are clear.  There is no pleural effusion or pneumothorax.
The trachea is midline.  No acute bony abnormality is identified.
IMPRESSION: No acute cardiopulmonary disease.  No radiographic evidence of
pneumonia or CHF.

## 2011-01-12 IMAGING — CT CT HEAD W/O CM
1 of 2 series · 15 of 30 positions shown, 19 images · non-contrast
Comparison: CT of the head, and MRI/MRA of the brain performed
09/16/2009

CLINICAL DATA: Multiple recent falls, with diffuse weakness.
Headache.

CT HEAD WITHOUT CONTRAST
TECHNIQUE: Contiguous axial images were obtained from the base of
the skull through the vertex without contrast.

[Series 3: recon 2: brain · axial · 0.47mm/px · z∈[+155,+306]mm · 15 of 96 slices shown, 19 images]
[im 6/96  brain]
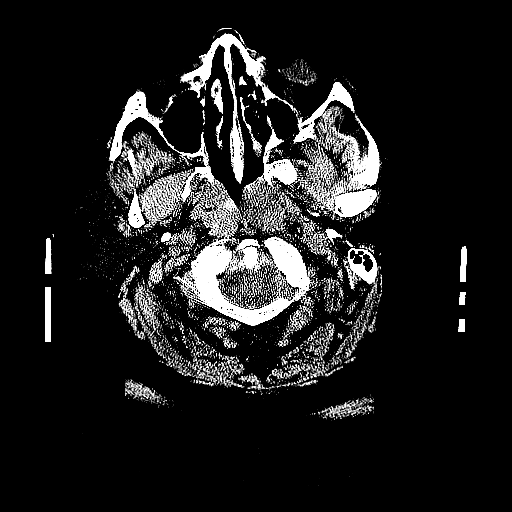
[im 6/96  bone]
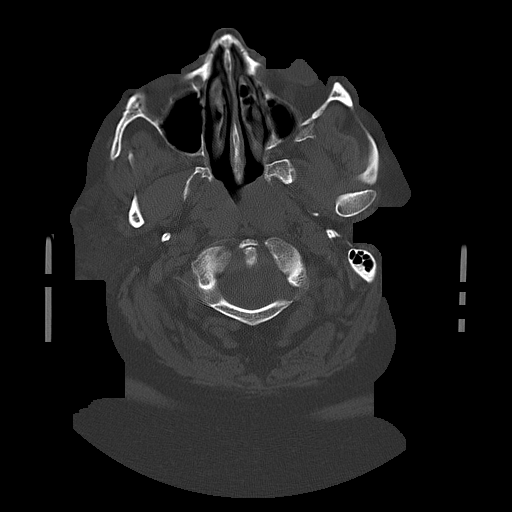
[im 11/96  brain]
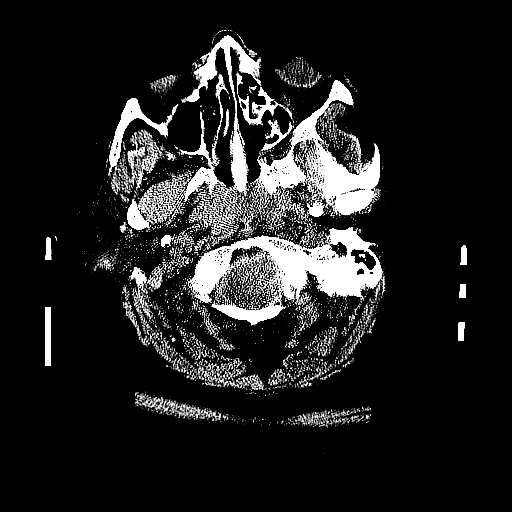
[im 21/96  brain]
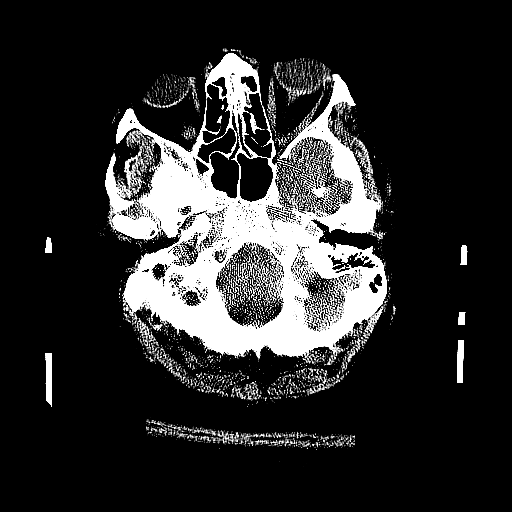
[im 26/96  brain]
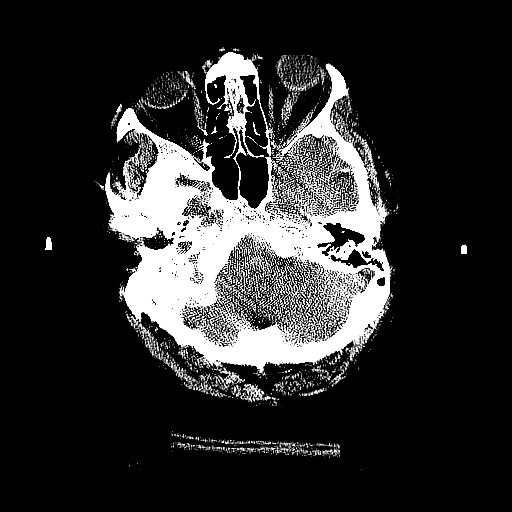
[im 31/96  brain]
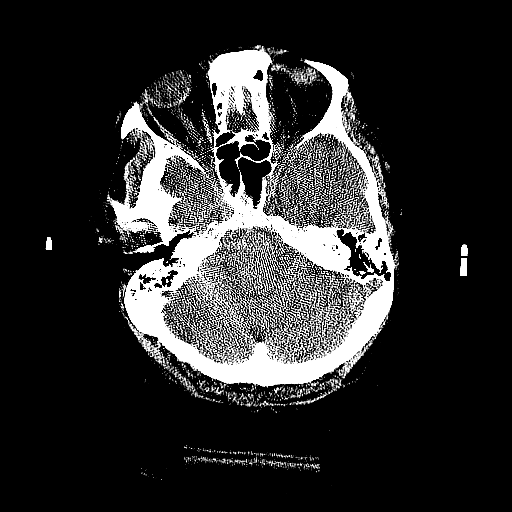
[im 31/96  bone]
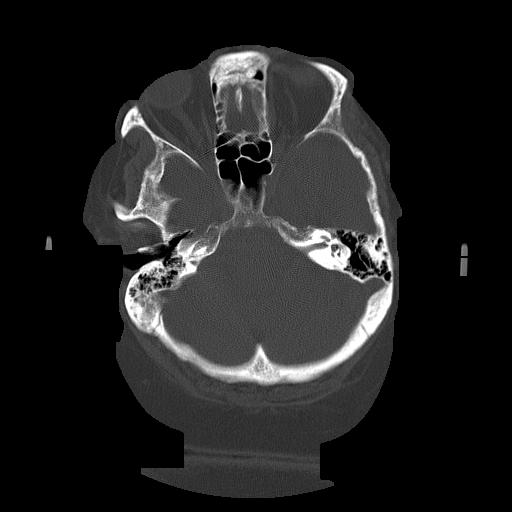
[im 36/96  brain]
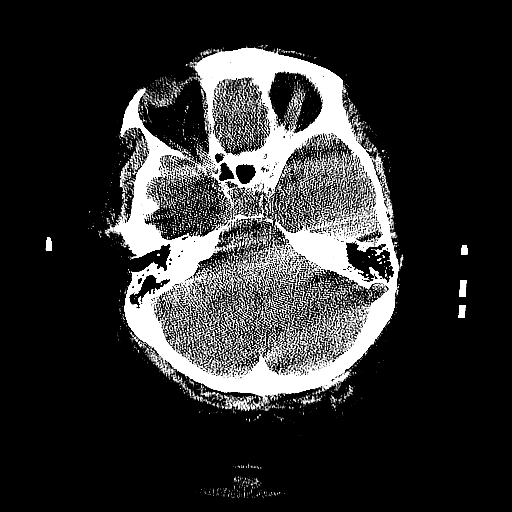
[im 41/96  brain]
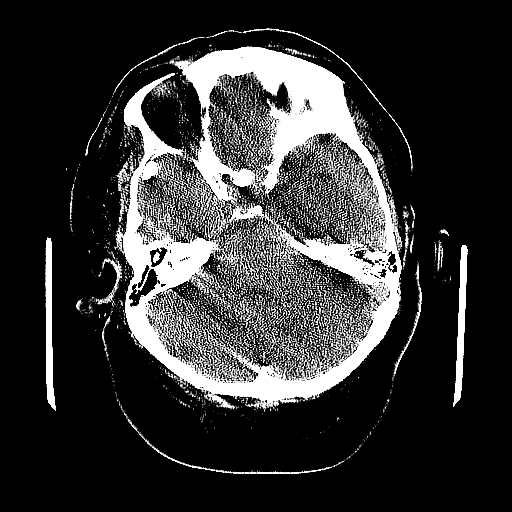
[im 51/96  brain]
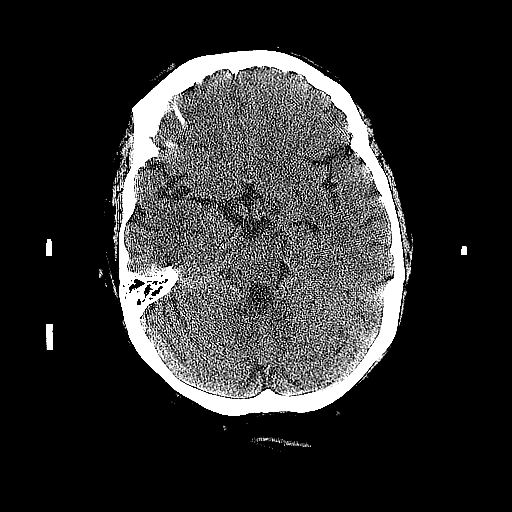
[im 56/96  brain]
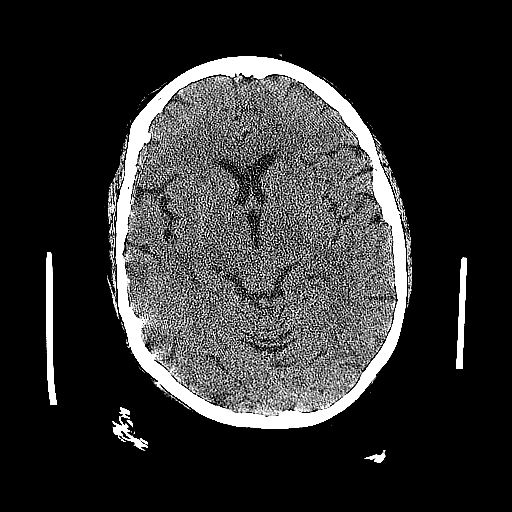
[im 56/96  bone]
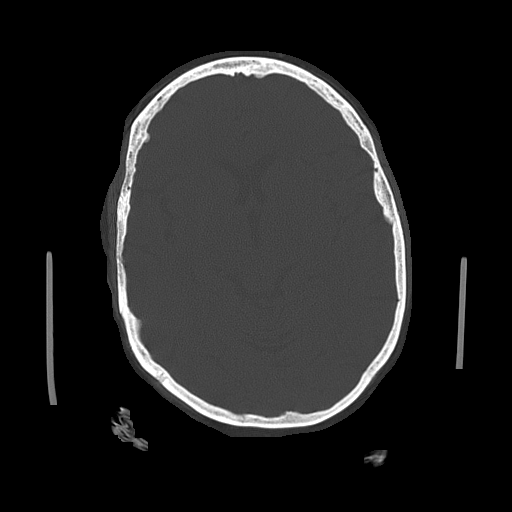
[im 61/96  brain]
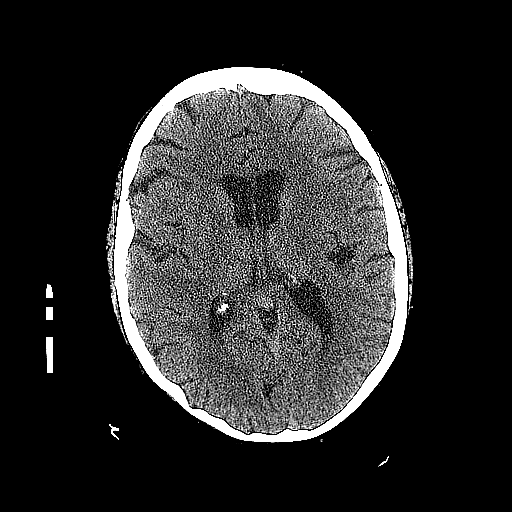
[im 66/96  brain]
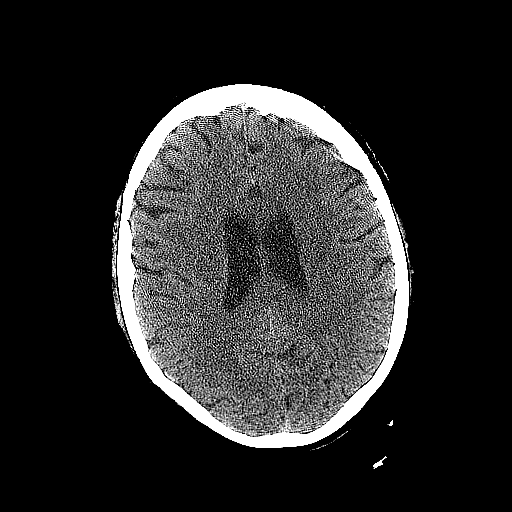
[im 71/96  brain]
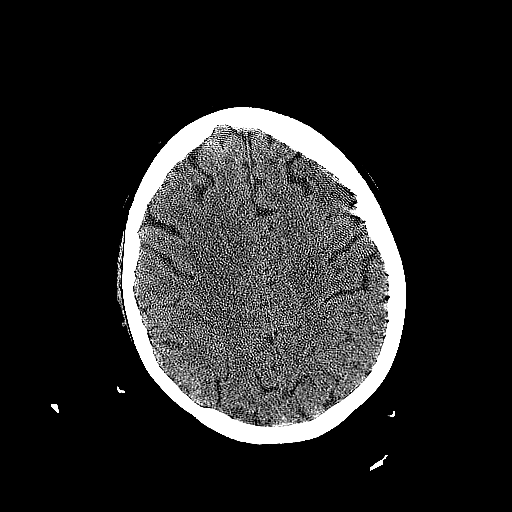
[im 81/96  brain]
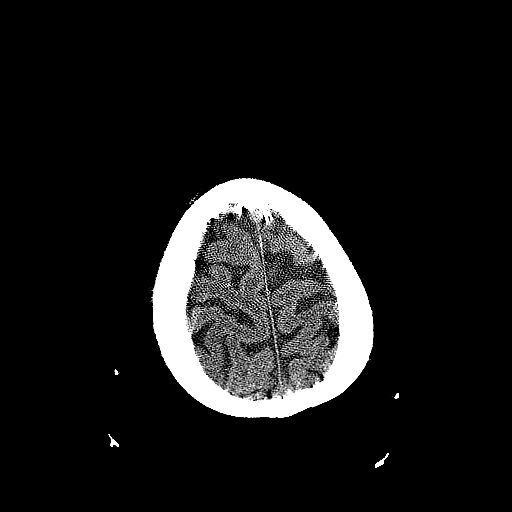
[im 81/96  bone]
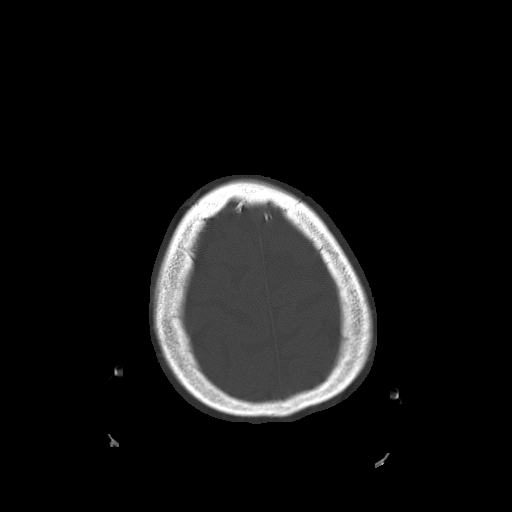
[im 86/96  brain]
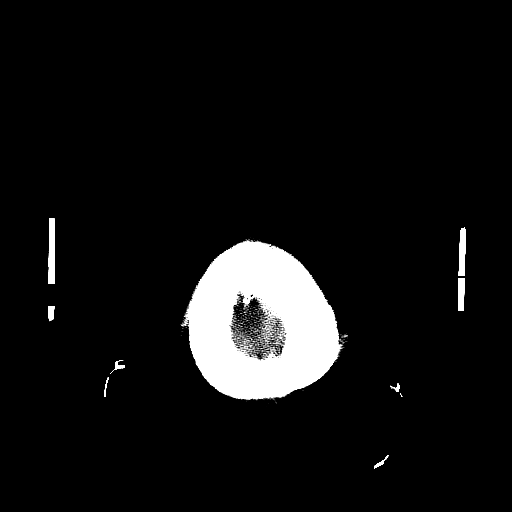
[im 91/96  brain]
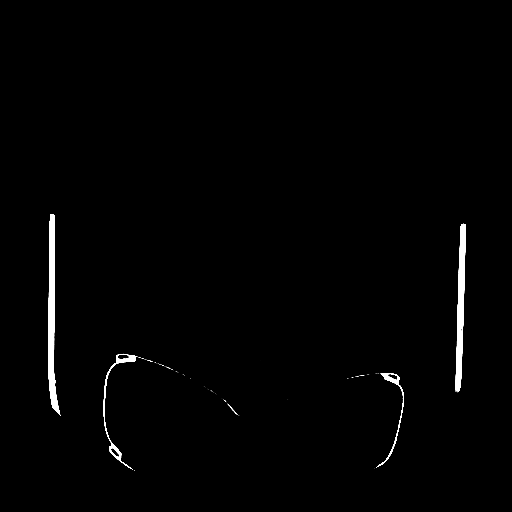

[15 of 30 positions shown; findings below may reference images not displayed]

FINDINGS: There is no evidence of acute infarction, mass lesion, or
intra- or extra-axial hemorrhage on CT.

The posterior fossa, including the cerebellum, brainstem and fourth
ventricle, is within normal limits.  The third and lateral
ventricles, and basal ganglia are unremarkable in appearance.
Previously noted hypodensities at the left basal ganglia and left
Sylvian fissure reflected normal sulcal patterns on MRI.  The
cerebral hemispheres are symmetric in appearance, with normal gray-
white differentiation.  No mass effect or midline shift is seen.

There is no evidence of fracture; visualized osseous structures are
unremarkable in appearance.  The visualized portions of the orbits
are within normal limits.  The paranasal sinuses and mastoid air
cells are well-aerated.  No significant soft tissue abnormalities
are seen.
IMPRESSION: Unremarkable noncontrast CT of the head.

## 2011-01-22 ENCOUNTER — Telehealth: Payer: Self-pay | Admitting: *Deleted

## 2011-01-22 NOTE — Telephone Encounter (Signed)
Left message for patient to return call. Please tell [name] that her refill request for vicodin was denied. She will need to schedule appointment with new PCP for further refills. Kahner Yanik, Kevin Fenton

## 2011-01-26 ENCOUNTER — Telehealth: Payer: Self-pay | Admitting: *Deleted

## 2011-01-26 ENCOUNTER — Other Ambulatory Visit: Payer: Self-pay | Admitting: Family Medicine

## 2011-01-26 MED ORDER — HYDROCODONE-ACETAMINOPHEN 5-325 MG PO TABS: 1.0000 | ORAL_TABLET | Freq: Four times a day (QID) | ORAL | Status: DC | PRN

## 2011-01-26 NOTE — Telephone Encounter (Signed)
Called to inform patient that Dr Juleen China filled her vicodin but she will need to schedule an office visit with her new PCP before she could receive any further refills.Busick, Kevin Fenton  Called in Rx to pharmacy [phone].Busick, Kevin Fenton

## 2011-02-03 ENCOUNTER — Encounter: Payer: Self-pay | Admitting: Family Medicine

## 2011-02-03 ENCOUNTER — Ambulatory Visit (INDEPENDENT_AMBULATORY_CARE_PROVIDER_SITE_OTHER): Payer: Medicaid Other | Admitting: Family Medicine

## 2011-02-03 DIAGNOSIS — E1065 Type 1 diabetes mellitus with hyperglycemia: Secondary | ICD-10-CM

## 2011-02-03 DIAGNOSIS — IMO0002 Reserved for concepts with insufficient information to code with codable children: Secondary | ICD-10-CM

## 2011-02-04 ENCOUNTER — Encounter: Payer: Self-pay | Admitting: Family Medicine

## 2011-02-04 NOTE — Assessment & Plan Note (Signed)
Due to random low BG in conjunction with mostly high (>200) BG on current regimen, will not adjust today. Advised patient to make an appointment to be seen in the pharmacy clinic for a more thorough evaluation and close follow-up. Patient agreed.

## 2011-02-04 NOTE — Progress Notes (Signed)
  Subjective:    Patient ID: [name], female    DOB: September 02, 1950, 60 y.o.   MRN: [id number]  HPI  Patient presenting today to have last visit with me. We spent 20 minutes updating chart and discussing medical issues. She had no new concerns today. We discussed the patient's last hospitalization in February for fall/deconditioning and subsequent stay at [facility] Unfortunately, a close family member passed away recently and [name] has had a difficult time coping.  1. DM, Insulin-Dependent: Rx Lantus, Novolog. BG log book reviewed. LOW = 78, HIGH 576. BG taken QID. Lows are at random times.  No CP, SOB, numbness/tingling, increased LE edema today.  Review of Systems SEE HPI.    Objective:   Physical Exam General:  Well-developed, well-nourished, in no acute distress; alert, appropriate and cooperative throughout examination. Vitals reviewed. Lungs:  Normal respiratory effort, chest expands symmetrically. Lungs are clear to auscultation, no crackles or wheezes. Heart:  Normal rate and regular rhythm. S1 and S2 normal without gallop, murmur, click, rub or other extra sounds. Pulses:  Dorsalis pedis and posterior tibial pulses are full and equal bilaterally. Extremities:  Trace left pedal edema and trace right pedal edema.      Assessment & Plan:

## 2011-02-09 ENCOUNTER — Encounter: Payer: Self-pay | Admitting: Family Medicine

## 2011-02-09 DIAGNOSIS — Z79899 Other long term (current) drug therapy: Secondary | ICD-10-CM | POA: Insufficient documentation

## 2011-02-22 ENCOUNTER — Other Ambulatory Visit: Payer: Self-pay | Admitting: Family Medicine

## 2011-02-22 MED ORDER — DOXEPIN HCL 25 MG PO CAPS: 25.0000 mg | ORAL_CAPSULE | Freq: Every day | ORAL | Status: DC

## 2011-03-02 ENCOUNTER — Other Ambulatory Visit: Payer: Self-pay | Admitting: Family Medicine

## 2011-03-02 MED ORDER — PREGABALIN 100 MG PO CAPS: 100.0000 mg | ORAL_CAPSULE | Freq: Three times a day (TID) | ORAL | Status: DC

## 2011-03-13 IMAGING — CT CT HEAD W/O CM
2 series · 17 of 30 positions shown, 20 images · non-contrast
Comparison: 07/30/2010

CLINICAL DATA: Syncope, right arm weakness

CT HEAD WITHOUT CONTRAST
TECHNIQUE: Contiguous axial images were obtained from the base of
the skull through the vertex without contrast.

[Series 2: head_seq 4.5 h37s st · axial · 0.43mm/px · z∈[-86,+31]mm · 10 of 32 slices shown, 13 images]
[im 3/32  brain]
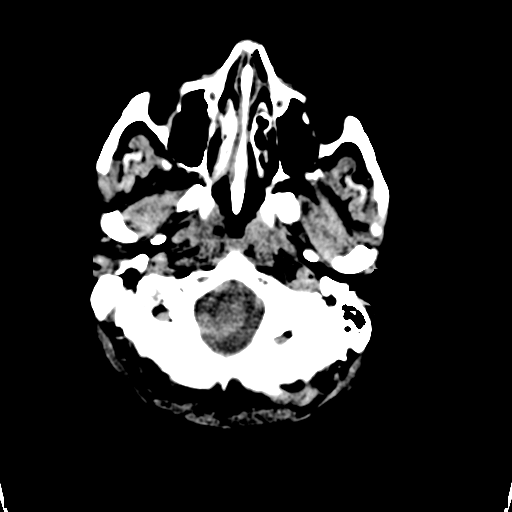
[im 3/32  bone]
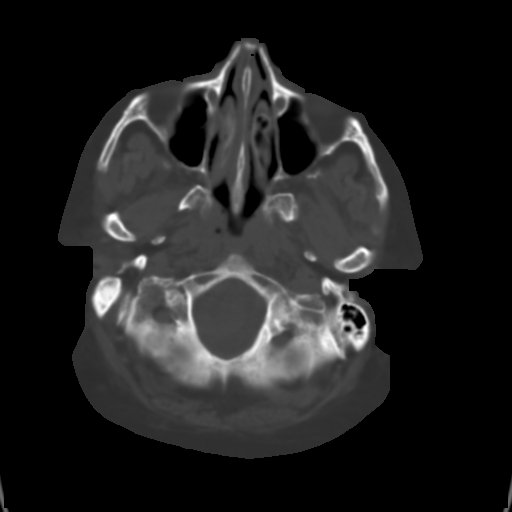
[im 6/32  brain]
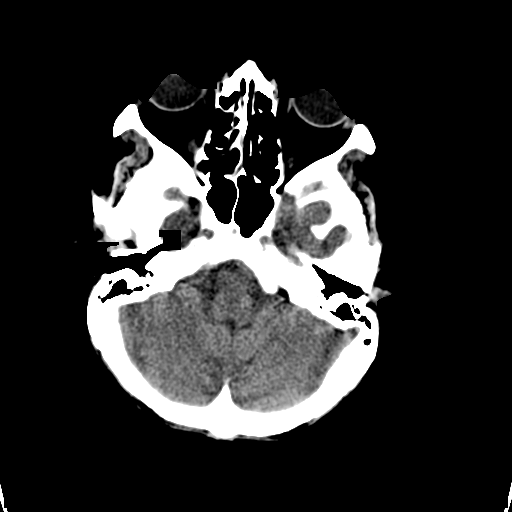
[im 9/32  brain]
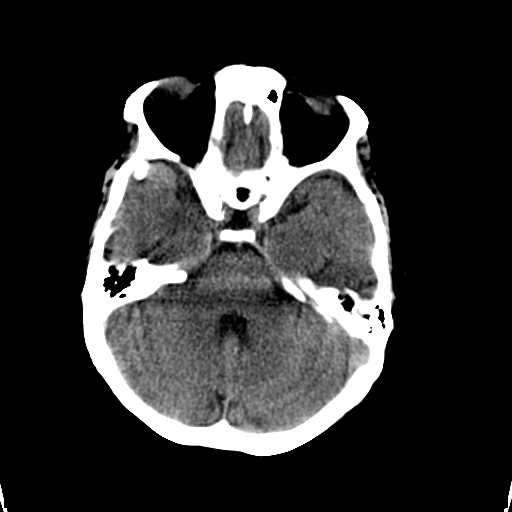
[im 12/32  brain]
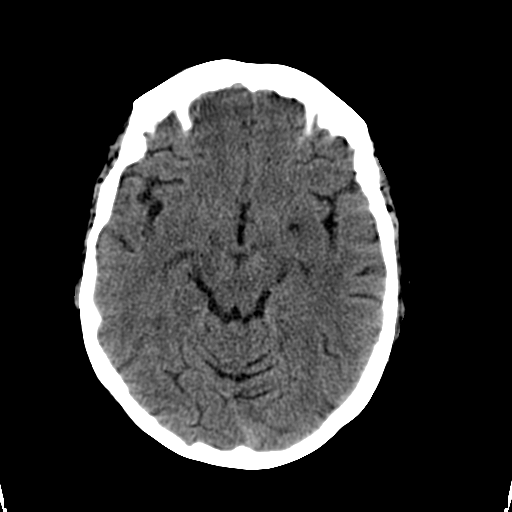
[im 15/32  brain]
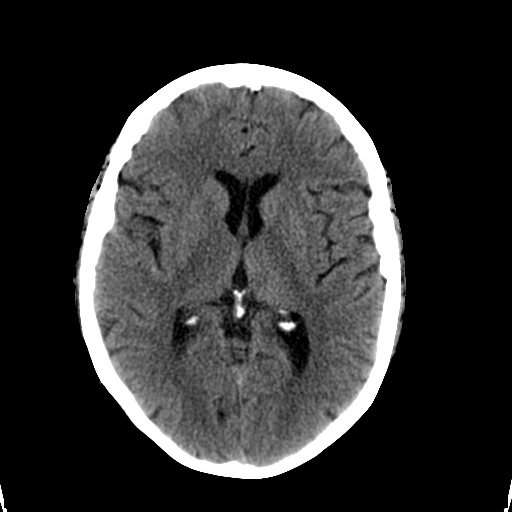
[im 15/32  bone]
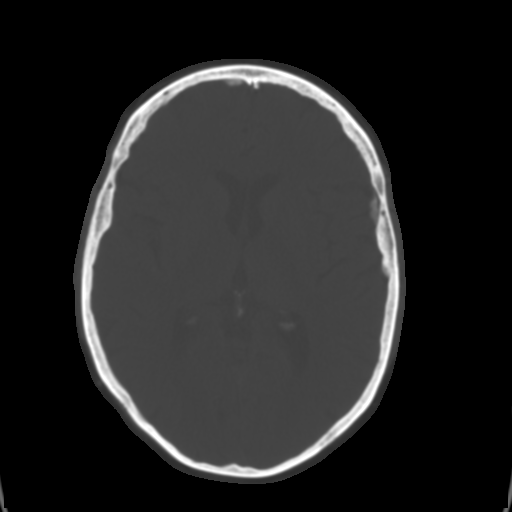
[im 17/32  brain]
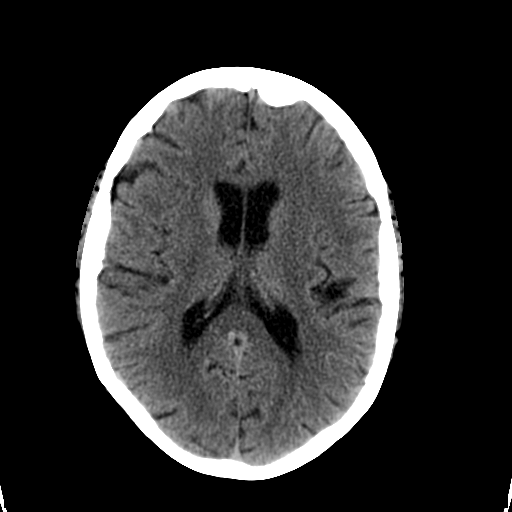
[im 20/32  brain]
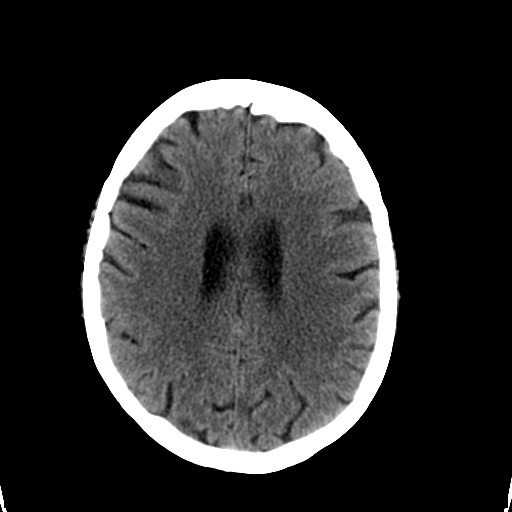
[im 23/32  brain]
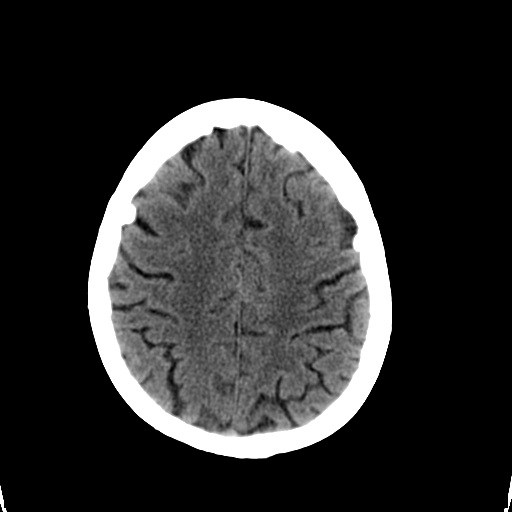
[im 26/32  brain]
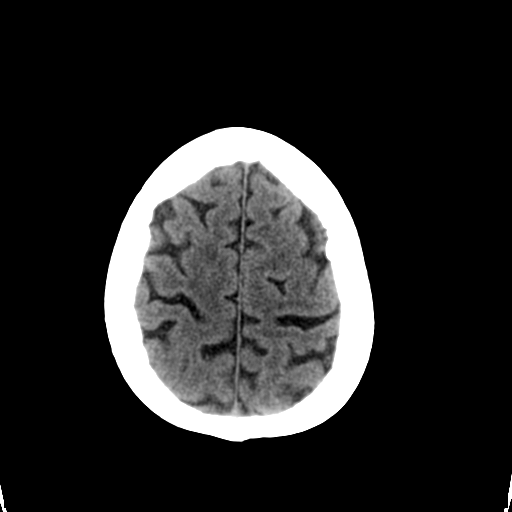
[im 26/32  bone]
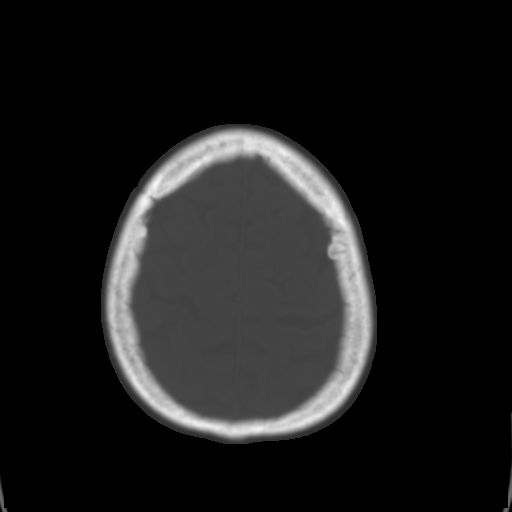
[im 29/32  brain]
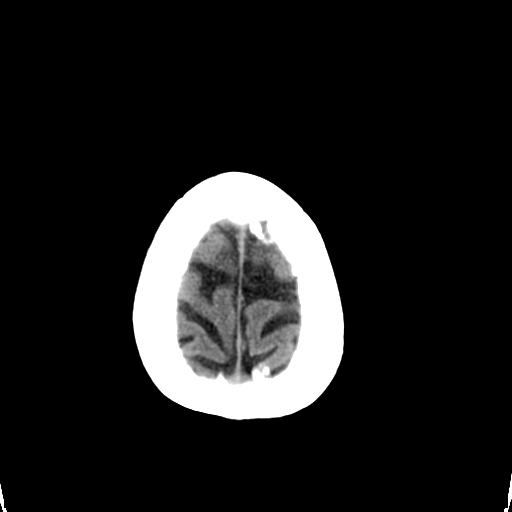

[Series 3: head_seq 3.0 h60s bone · axial · 0.43mm/px · z∈[-80,+19]mm · 7 of 48 slices shown]
[im 6/48  bone]
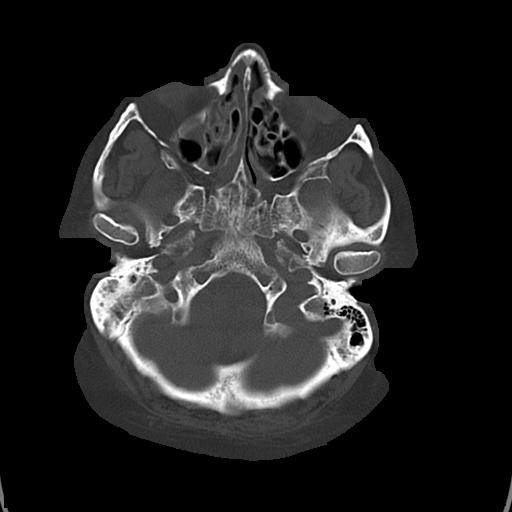
[im 12/48  bone]
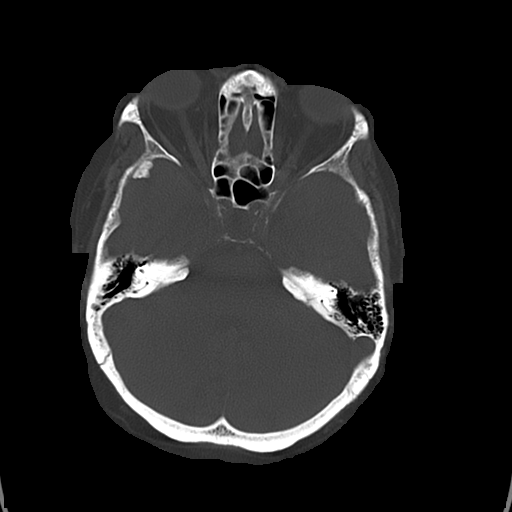
[im 17/48  bone]
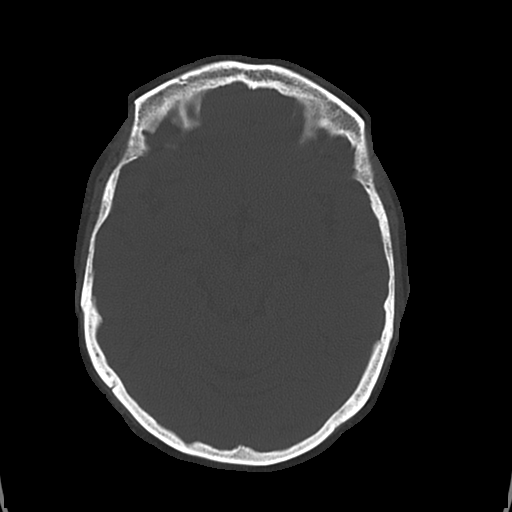
[im 23/48  bone]
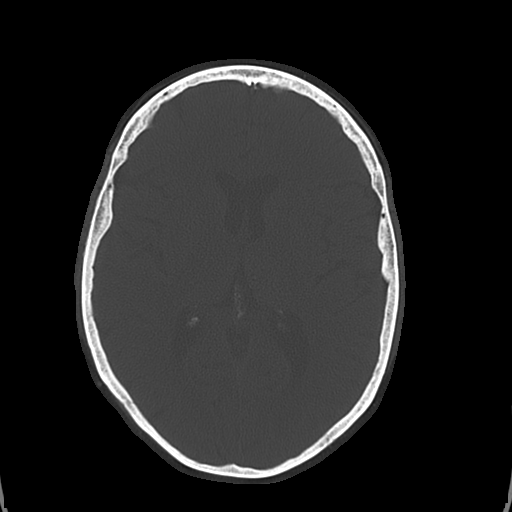
[im 28/48  bone]
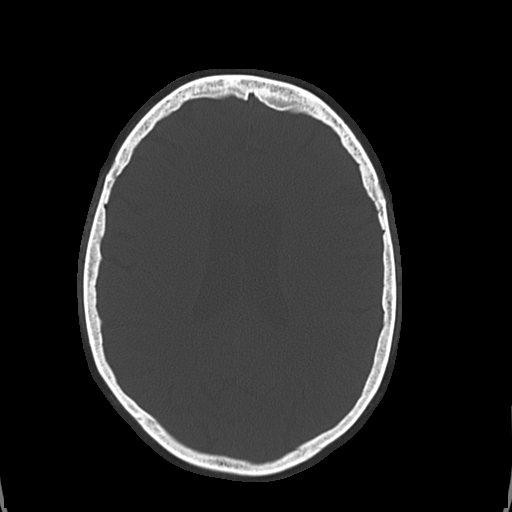
[im 34/48  bone]
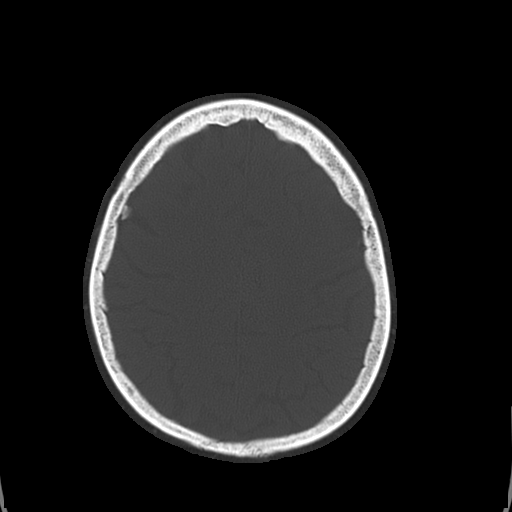
[im 39/48  bone]
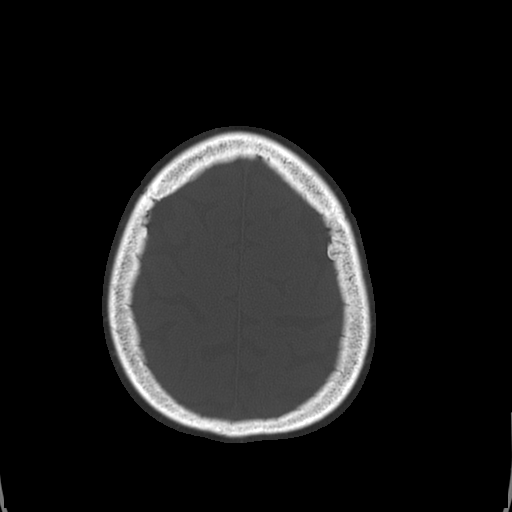

[17 of 30 positions shown; findings below may reference images not displayed]

FINDINGS: Perivascular space is incidentally noted in the left
temporal lobe image 12, unchanged. No acute hemorrhage, acute
infarction, or mass lesion is seen.  No midline shift.  Minimal
right maxillary, right sphenoid, and bilateral ethmoid
mucoperiosteal thickening noted.  No acute osseous finding.
IMPRESSION: No acute intracranial finding.

Sinusitis.

## 2011-03-13 IMAGING — CR DG HUMERUS 2V *R*
4 series · 4 of 4 positions shown · non-contrast
Comparison: None

CLINICAL DATA: Fall, right arm pain.

RIGHT HUMERUS - 2+ VIEW

[view not recorded (1 of 4)]
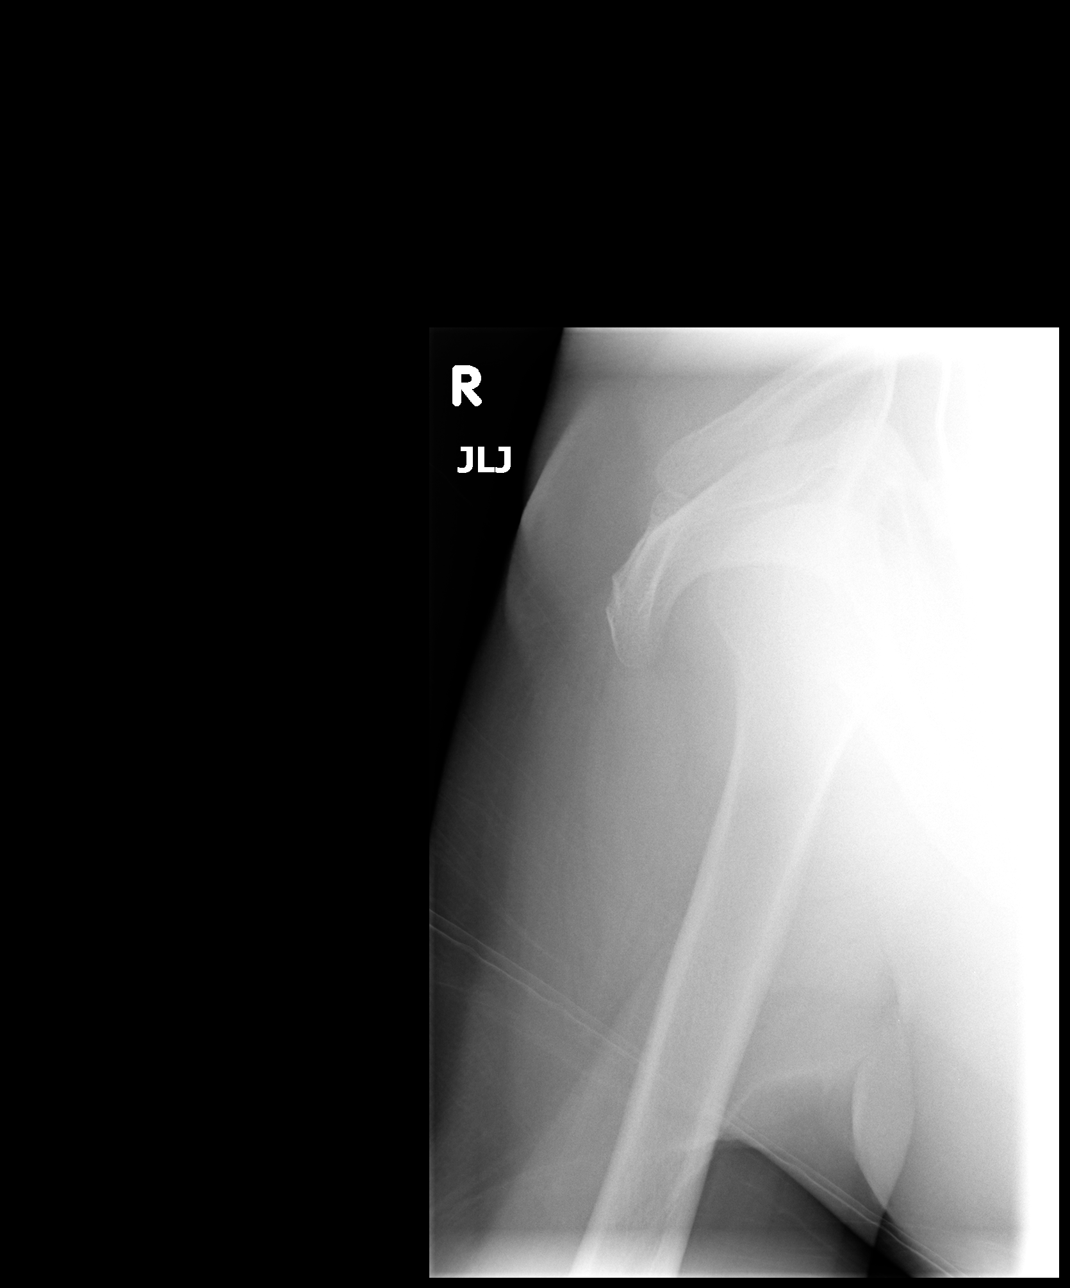

[view not recorded (2 of 4)]
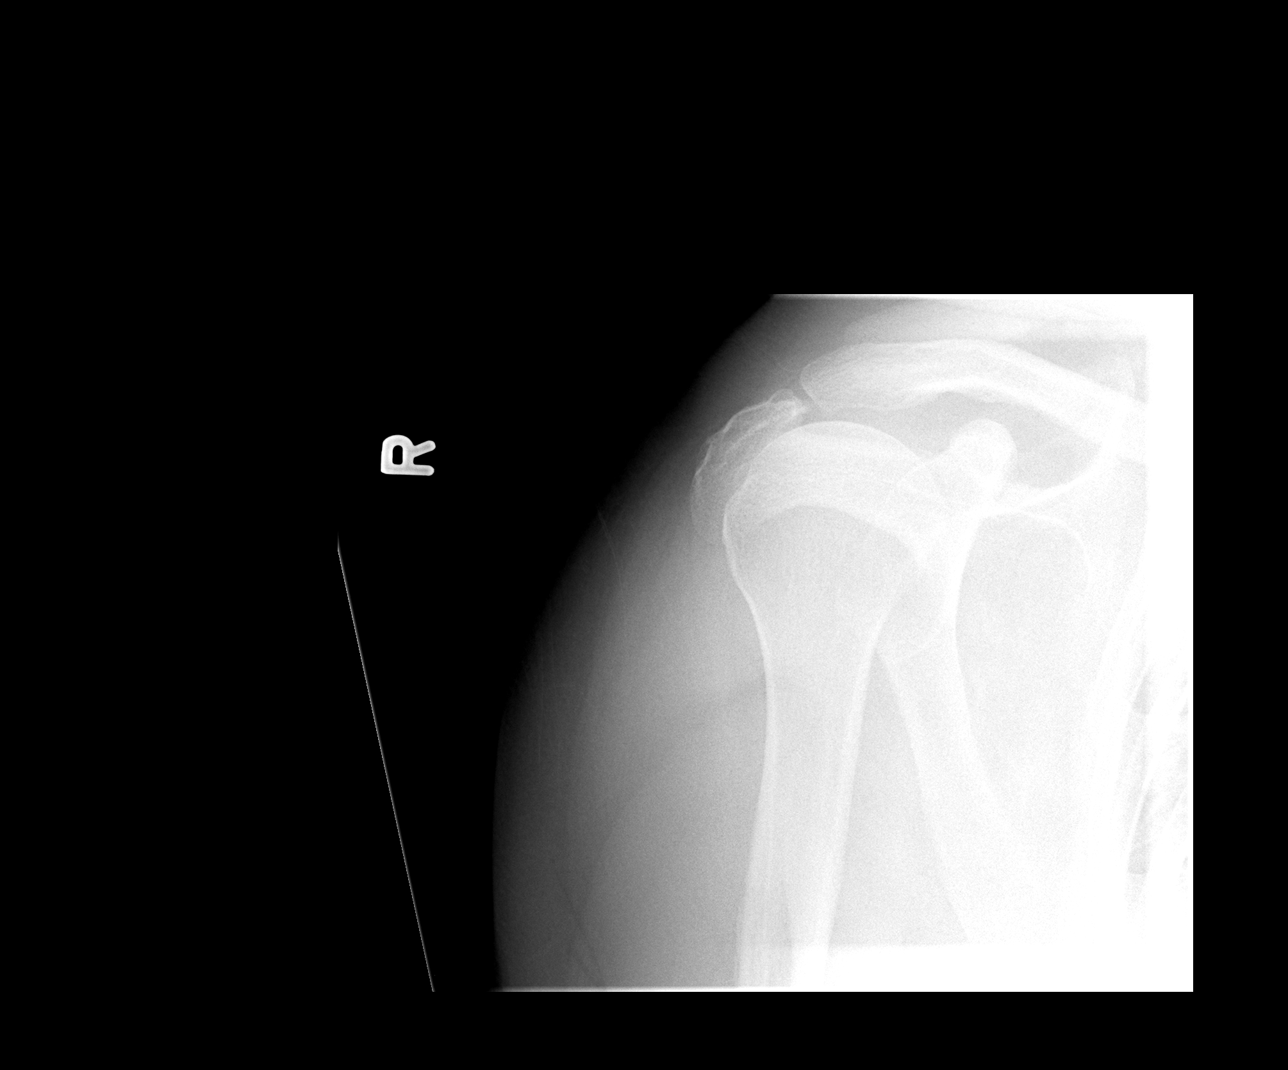

[view not recorded (3 of 4)]
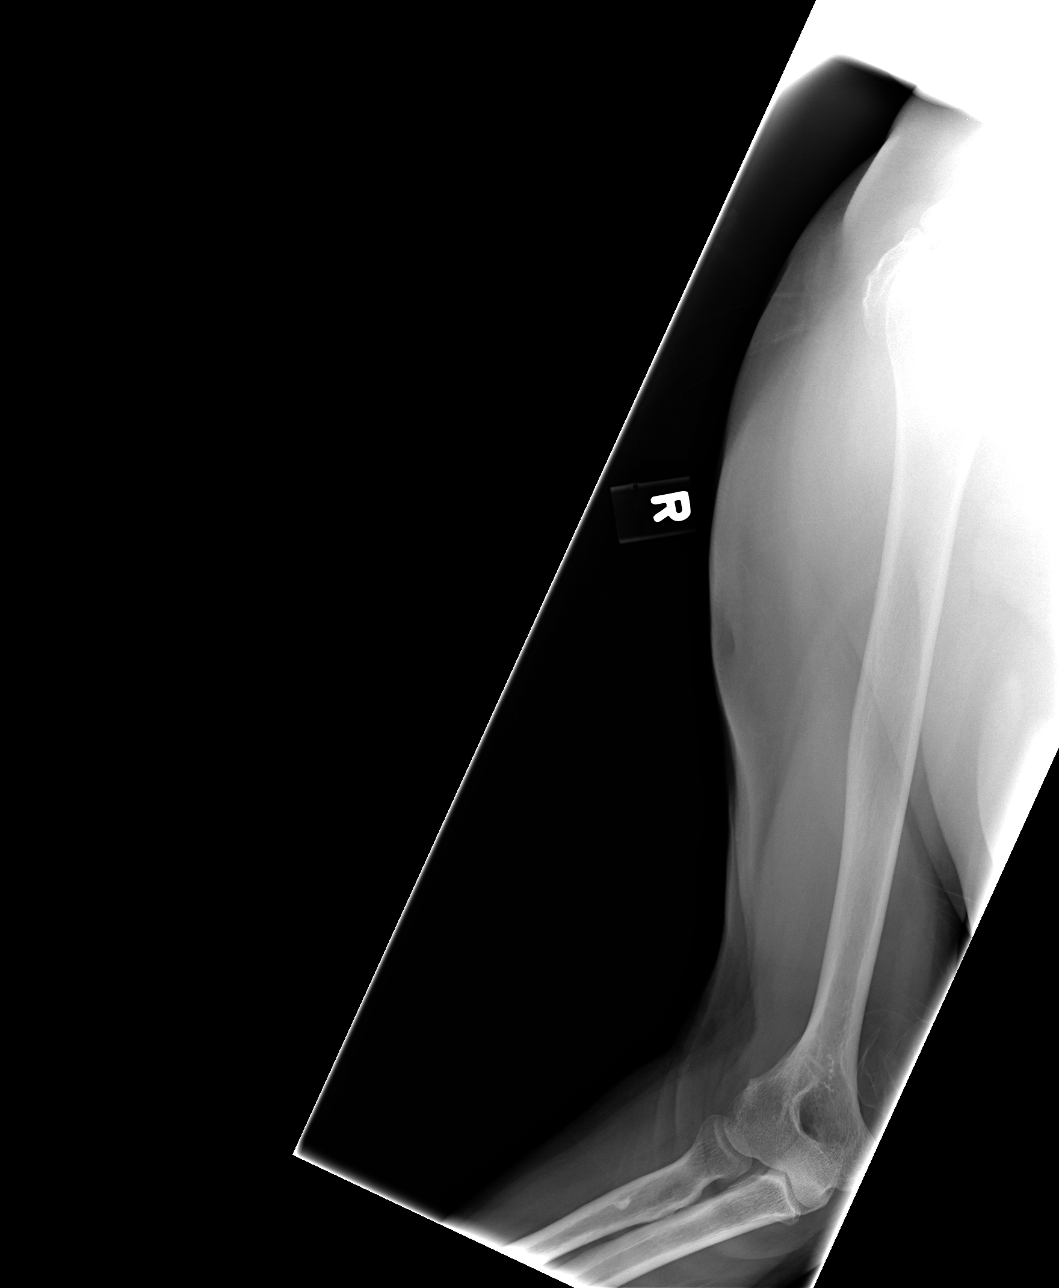

[view not recorded (4 of 4)]
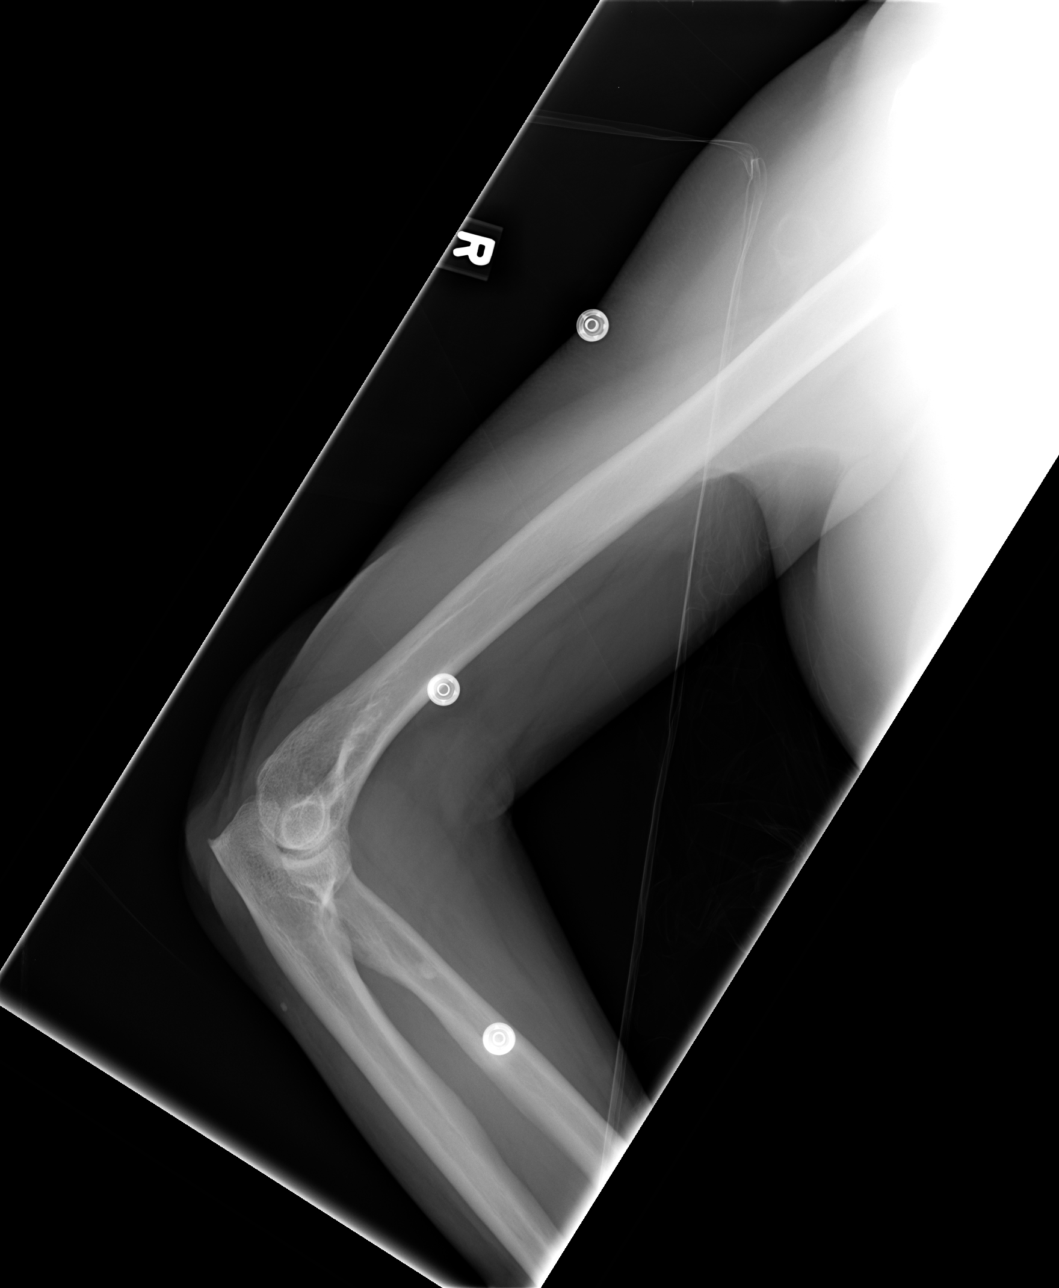

[4 of 4 positions shown; findings below may reference images not displayed]

FINDINGS: There is loss of the subacromial space likely reflecting
rotator cuff disease.  Mild degenerative changes in the right AC
joint. No acute bony abnormality.  Specifically, no fracture,
subluxation, or dislocation.  Soft tissues are intact.
IMPRESSION: No acute bony abnormality.

## 2011-03-13 IMAGING — CR DG KNEE COMPLETE 4+V*R*
5 series · 5 of 5 positions shown · non-contrast
Comparison: None

CLINICAL DATA: Trauma, fall

RIGHT KNEE - COMPLETE 4+ VIEW

[t knee ap right]
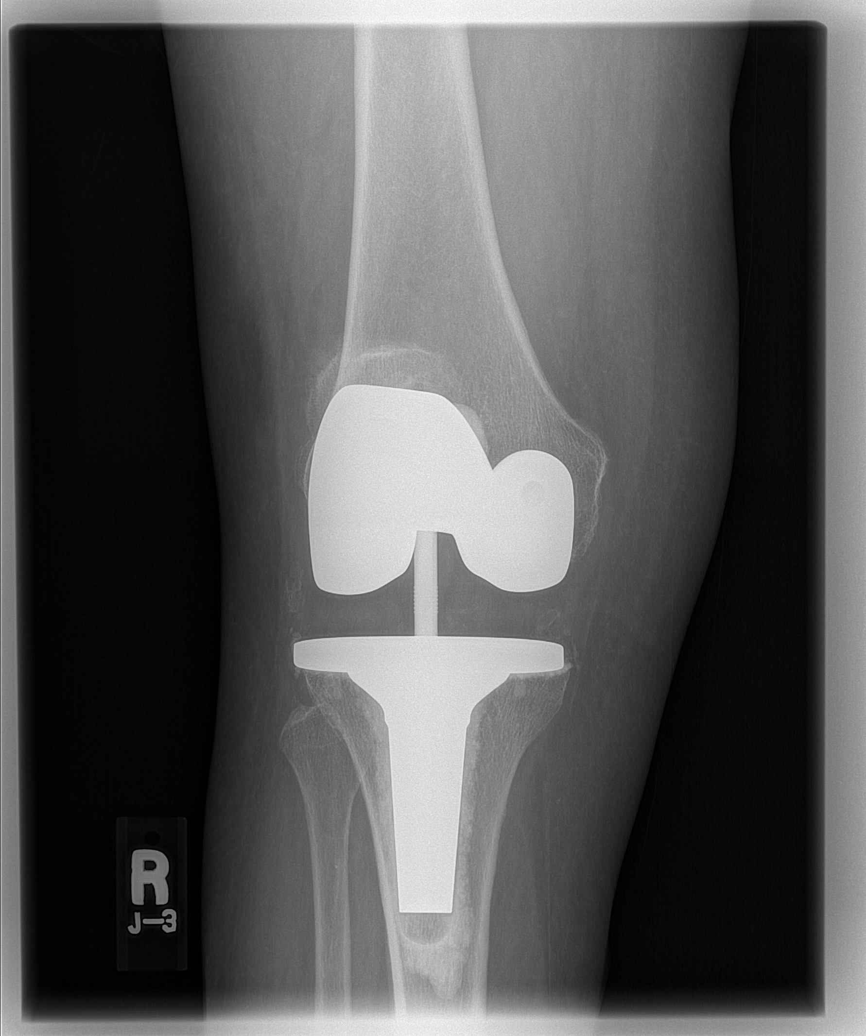

[t knee oblique right (1 of 2)]
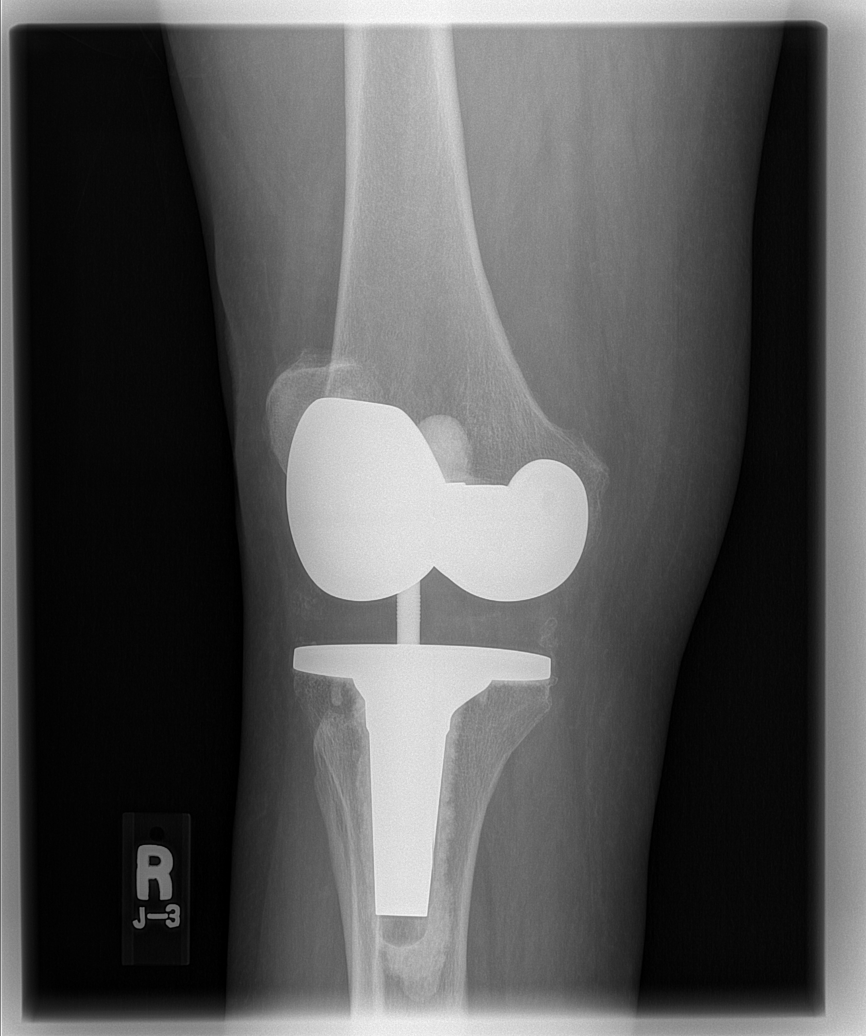

[t knee oblique right (2 of 2)]
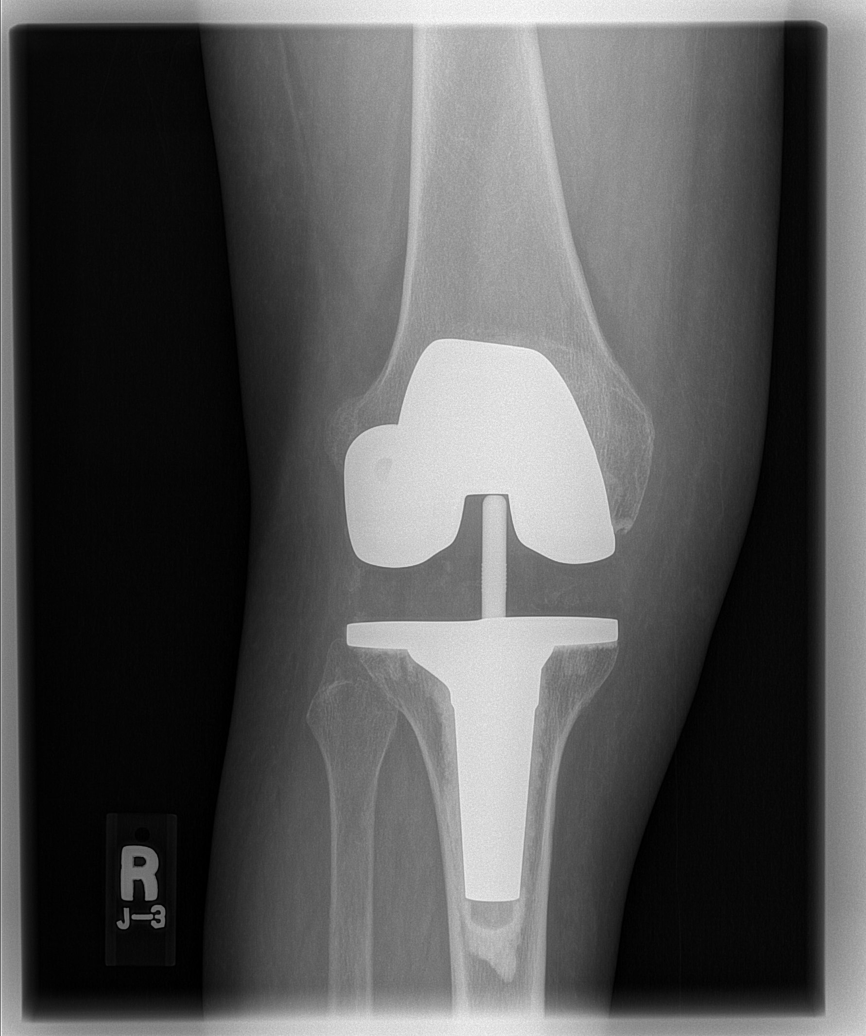

[t knee lat right]
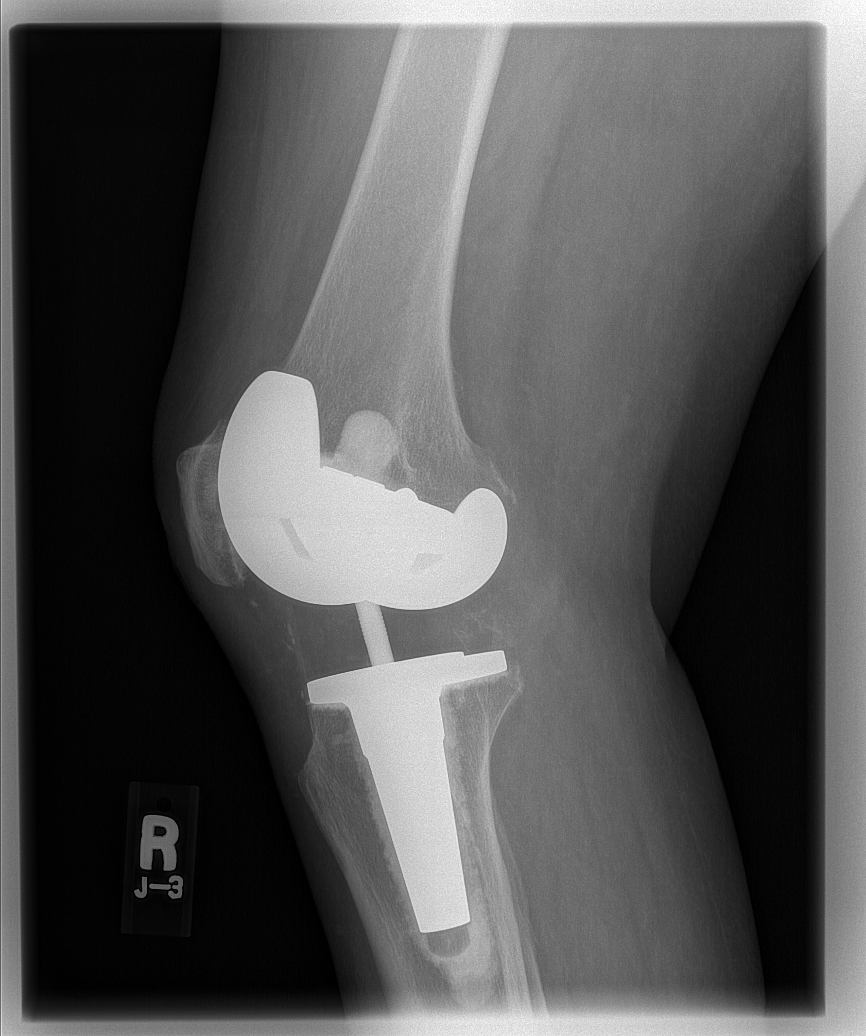

[view not recorded]
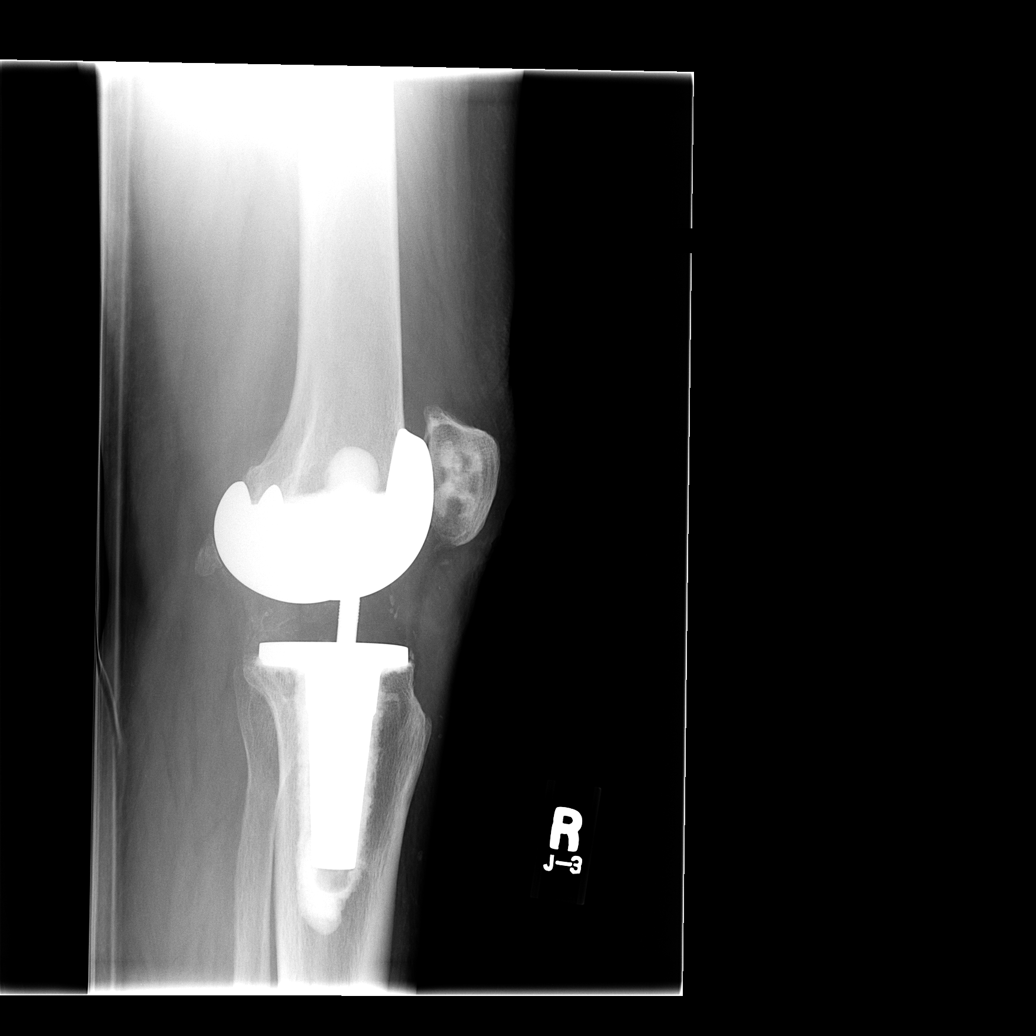

[5 of 5 positions shown; findings below may reference images not displayed]

FINDINGS: Postsurgical changes of total right knee replacement are
seen.  No evidence of hardware loosening or peri prostatic
fracture.  No joint effusion is seen.  The soft tissues are normal.
IMPRESSION: Postsurgical changes of total right knee replacement.  No acute
findings.

## 2011-03-15 ENCOUNTER — Other Ambulatory Visit: Payer: Self-pay | Admitting: Family Medicine

## 2011-03-15 IMAGING — CR DG PELVIS 1-2V
1 series · 1 of 1 positions shown · non-contrast
Comparison: MRI pelvis 05/12/2010.

CLINICAL DATA: 59-year-old female with weakness, fall, right hip
pain.

PELVIS - 1-2 VIEW

[t pelvis a.p.]
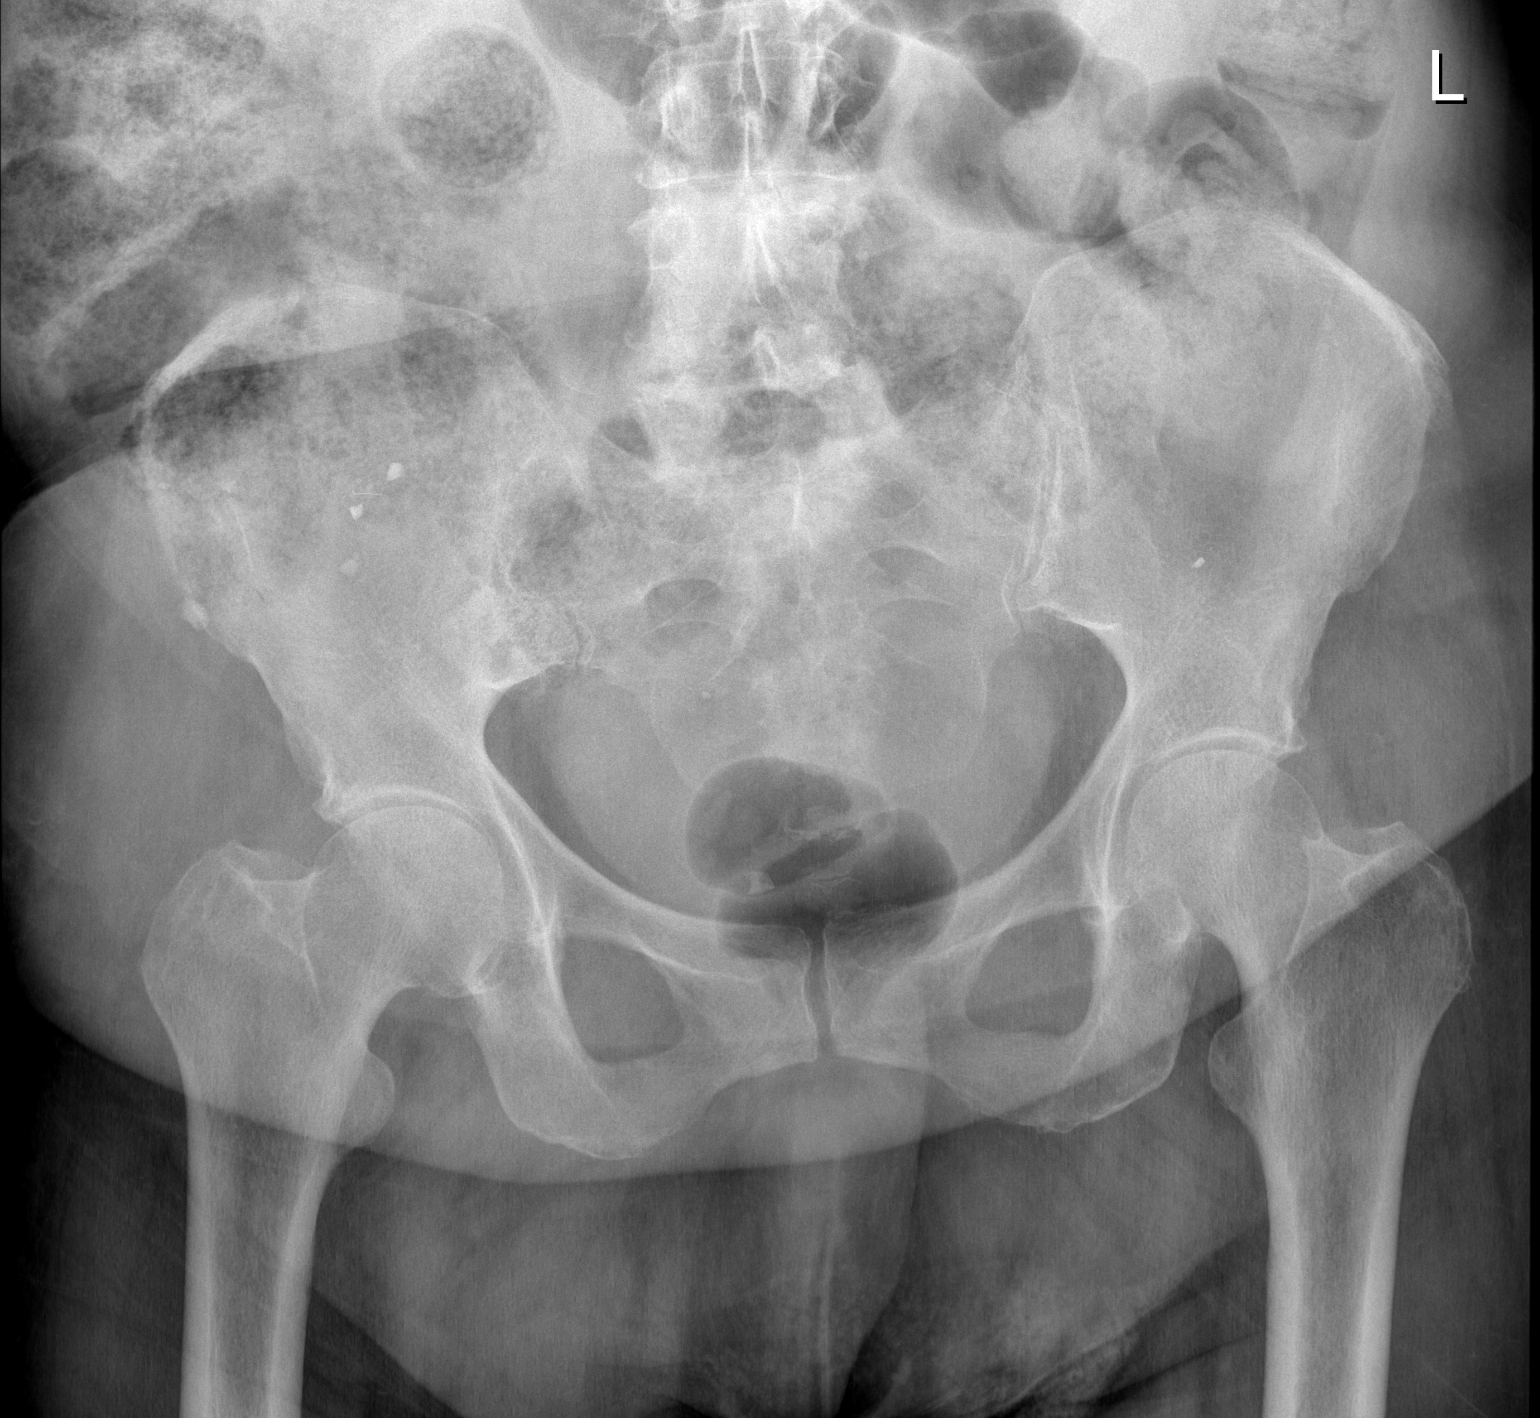

[1 of 1 positions shown; findings below may reference images not displayed]

FINDINGS: Femoral heads normally located.  Joint spaces preserved.
Bone mineralization is within normal limits.  Retained stool in the
colon.  Pelvis appears intact.  SI joints within normal limits.
Proximal femurs appear grossly intact.
IMPRESSION: No acute fracture or dislocation identified about the pelvis.

## 2011-03-15 IMAGING — CR DG HIP COMPLETE 2+V*R*
2 series · 2 of 2 positions shown · non-contrast
Comparison: Pelvis radiograph from the same day.

CLINICAL DATA: 59-year-old female with fall and pain.

RIGHT HIP - COMPLETE 2+ VIEW

[t hip ap right]
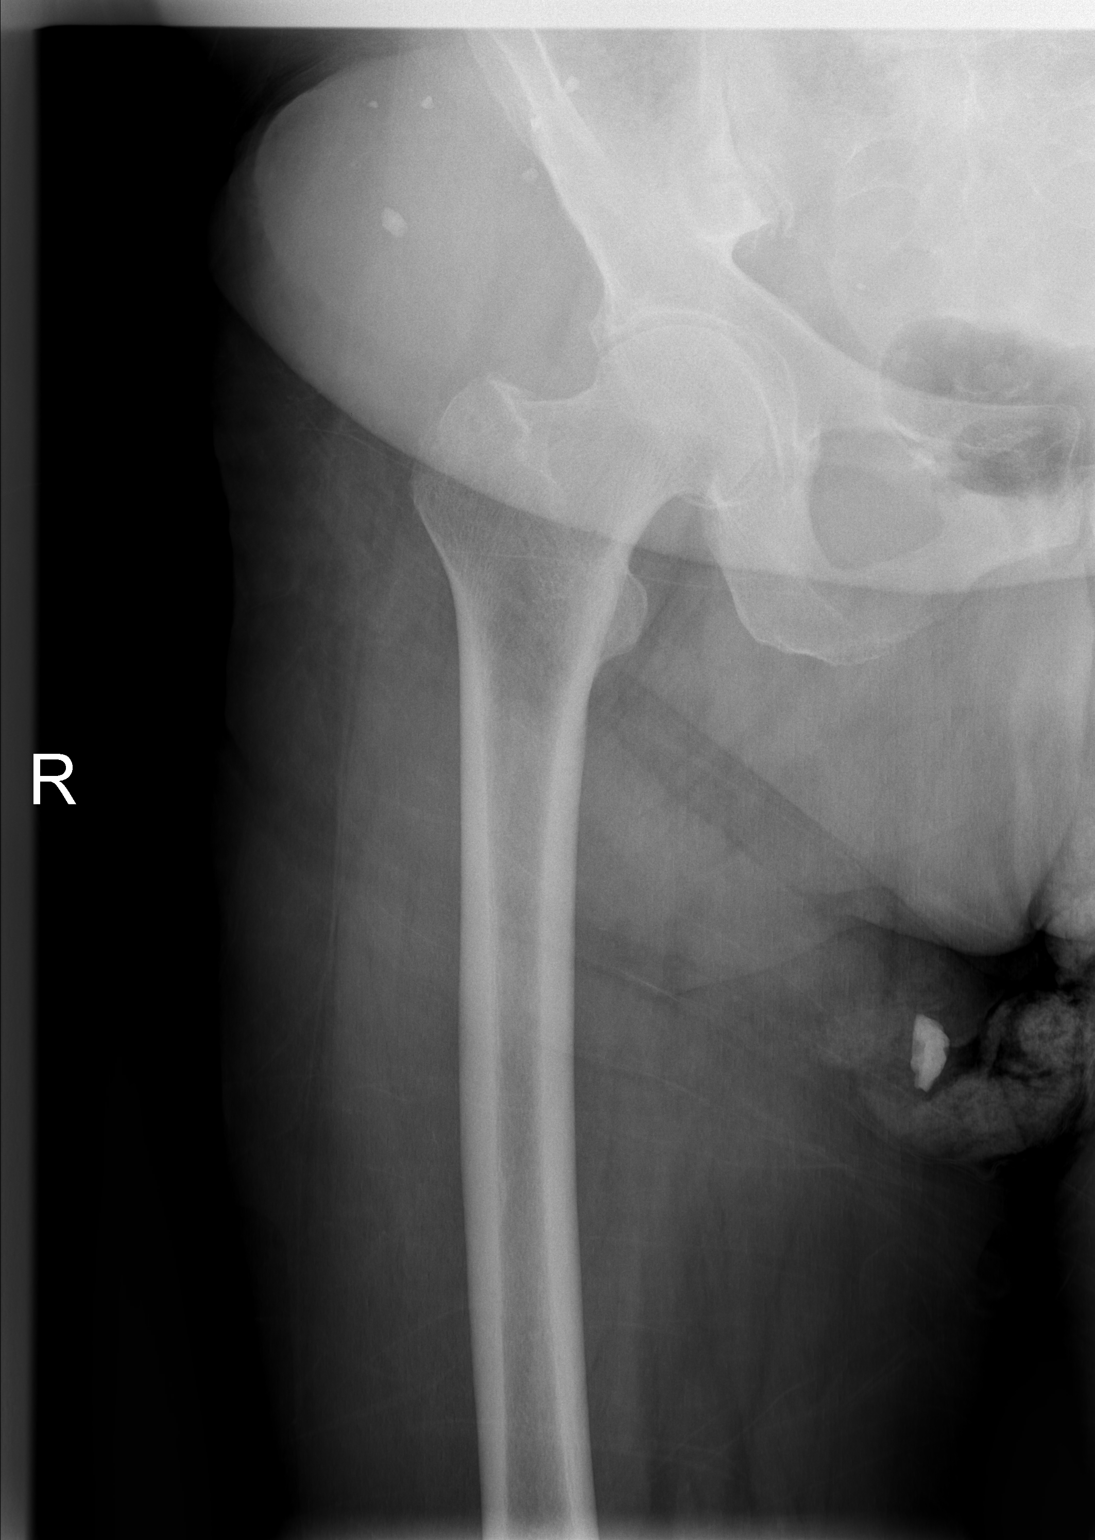

[t hip frog leg right]
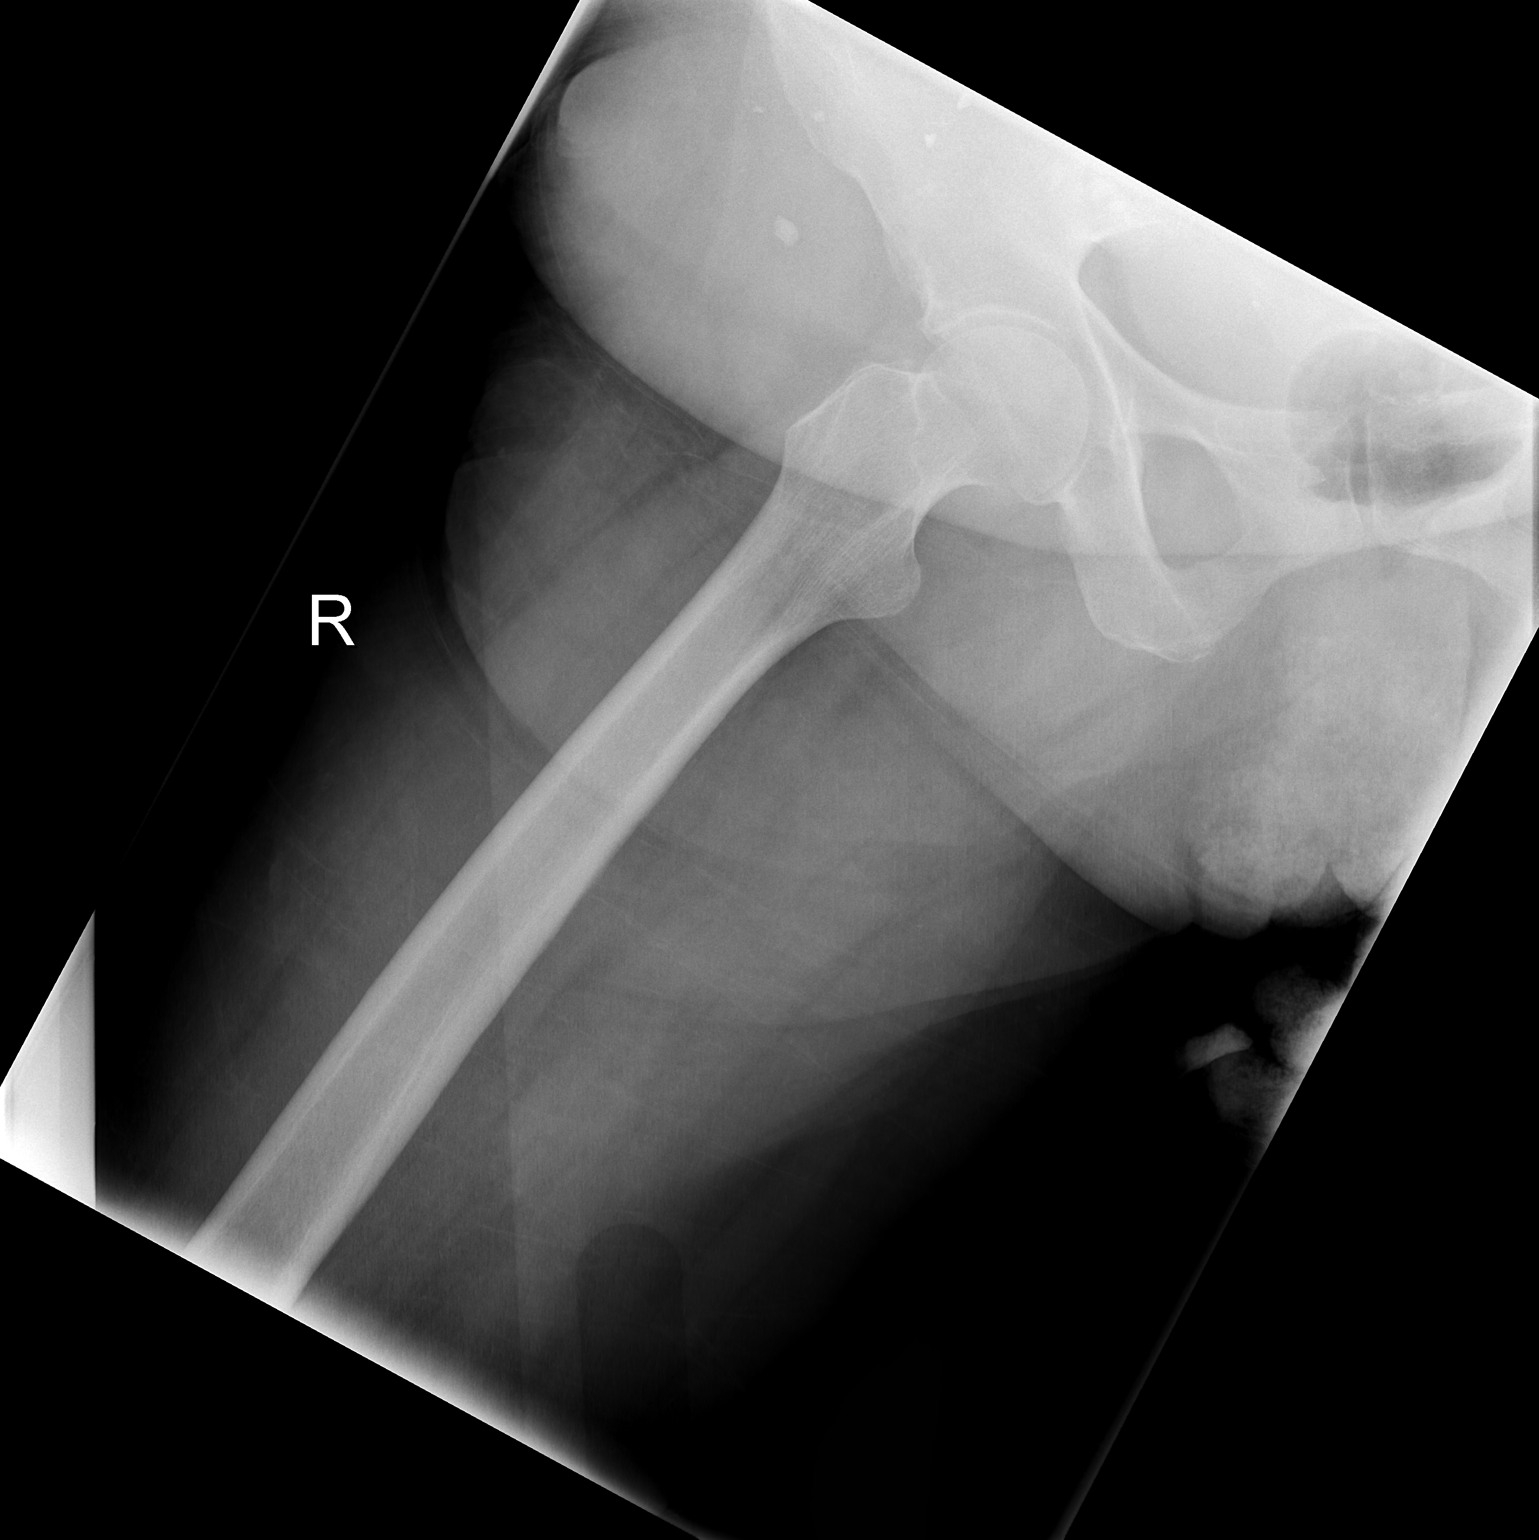

[2 of 2 positions shown; findings below may reference images not displayed]

FINDINGS: Calcified flank injection granulomas.  Right femoral head
normally located.  Proximal right femur appears intact.  Visualized
right hemi pelvis appears intact.
IMPRESSION: No acute fracture or dislocation identified about the right hip.

## 2011-03-15 IMAGING — CR DG CHEST 2V
2 series · 2 of 2 positions shown · non-contrast
Comparison: 07/30/2010 and earlier.

CLINICAL DATA: 59-year-old female status post fall with pain,
shortness of breath, cough.

CHEST - 2 VIEW

[t chest supine]
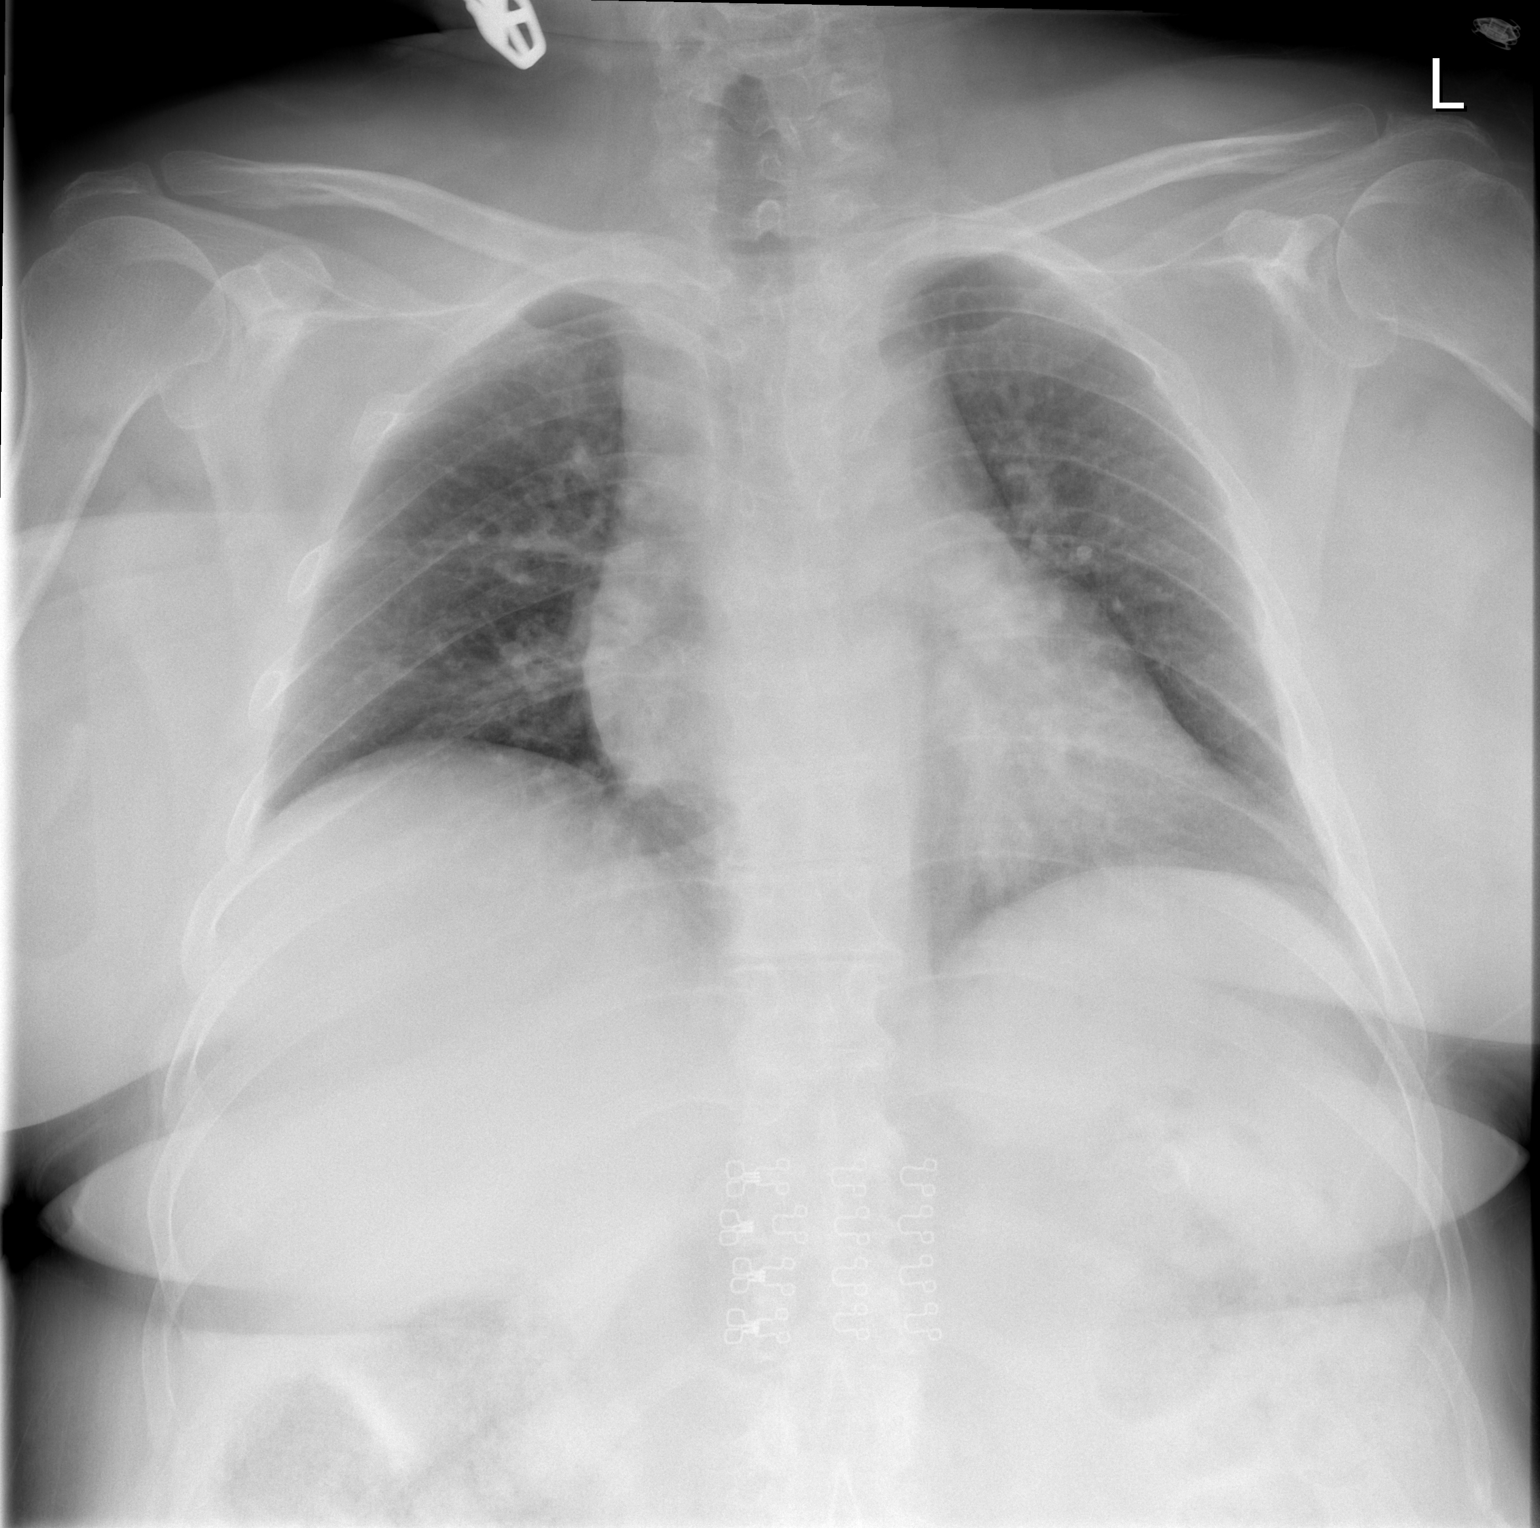

[w chest lat *]
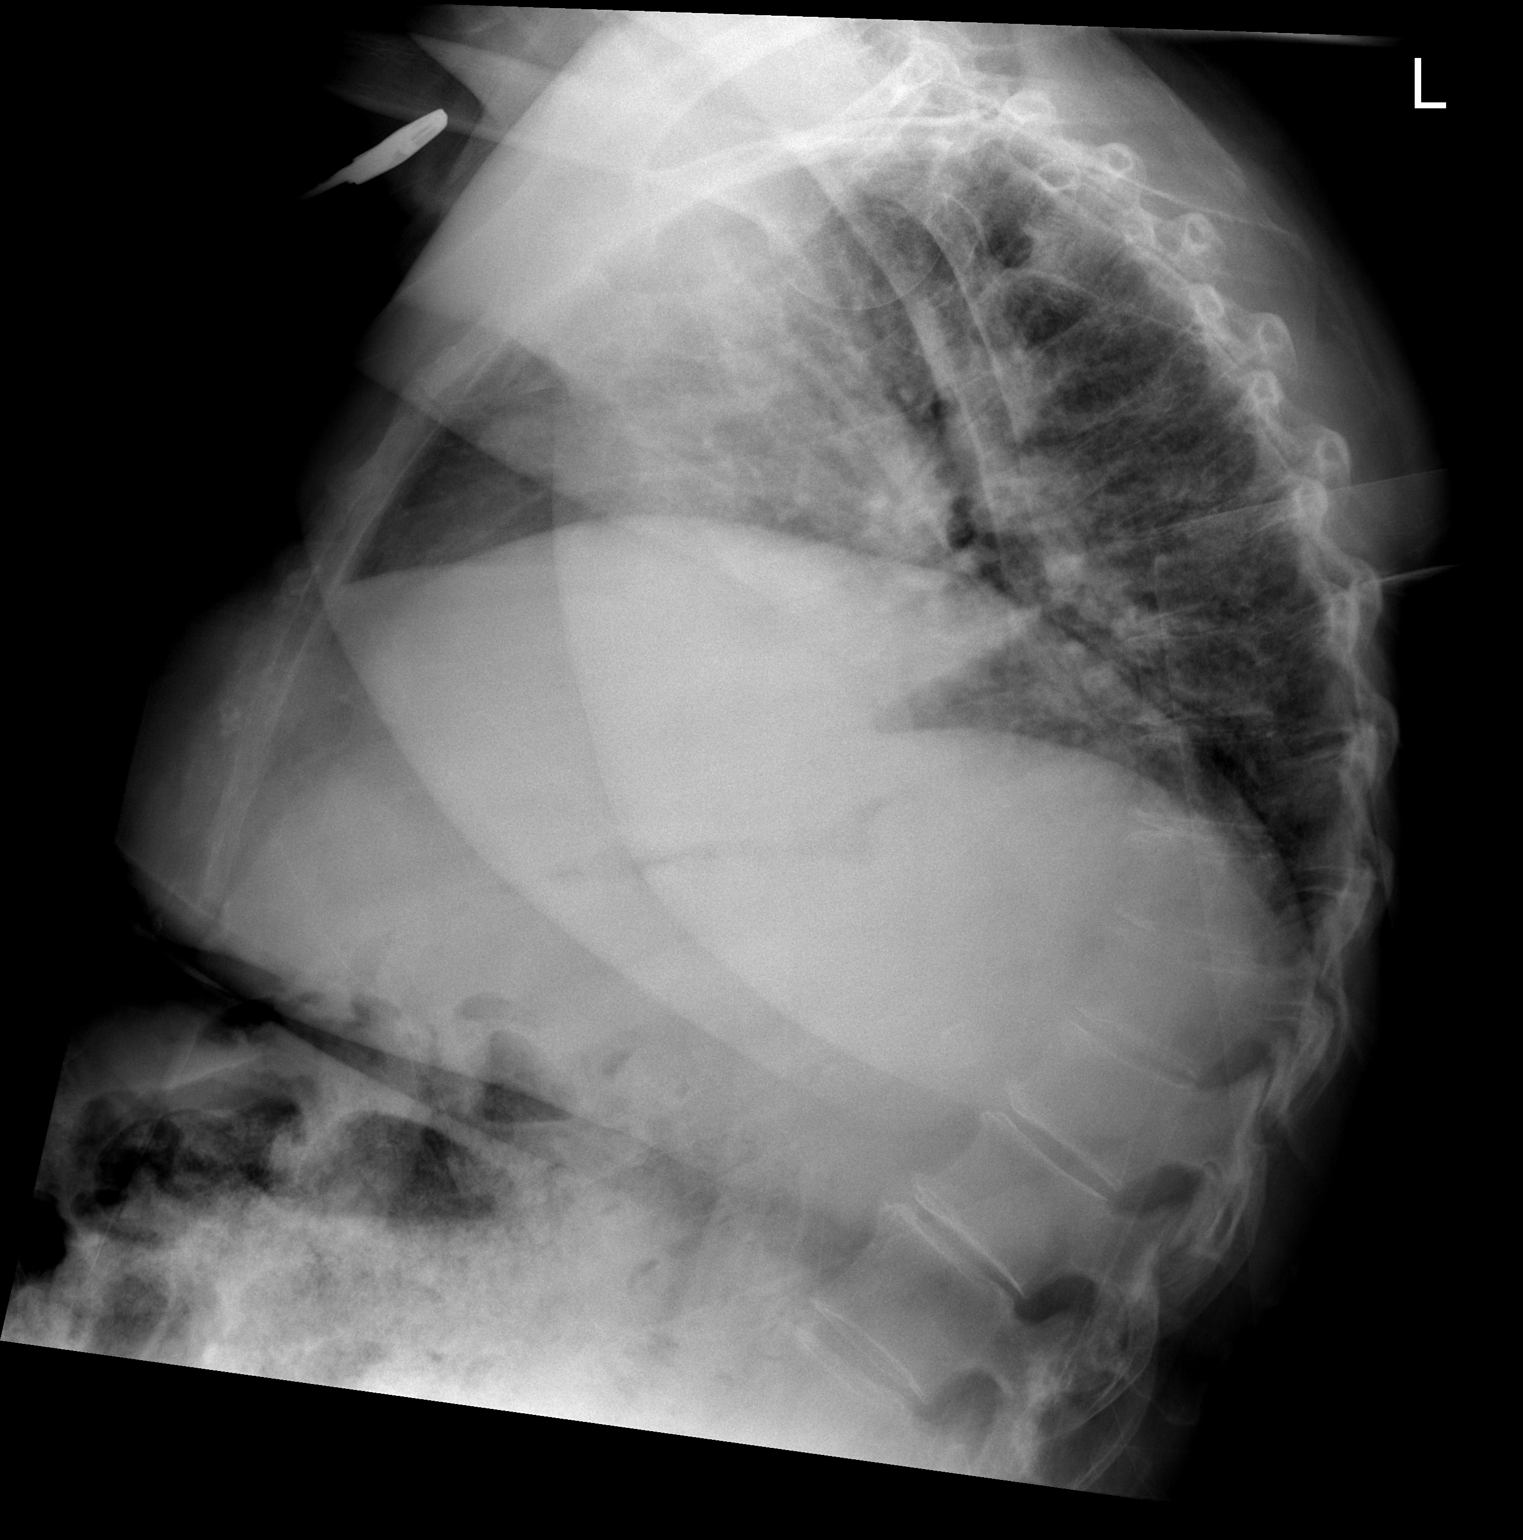

[2 of 2 positions shown; findings below may reference images not displayed]

FINDINGS: Very low lung volumes on the lateral view.  Cardiac size
and mediastinal contours are within normal limits.  Retrocardiac
streaky opacity likely is atelectasis.  No pneumothorax, pulmonary
edema, pleural effusion or consolidation.  Chronic exaggerated
thoracic kyphosis.  Retained stool in the abdomen. Visualized
tracheal air column is within normal limits.
IMPRESSION: Low lung volumes with retrocardiac atelectasis.

## 2011-03-15 MED ORDER — CETIRIZINE HCL 10 MG PO TABS: 10.0000 mg | ORAL_TABLET | Freq: Every day | ORAL | Status: DC

## 2011-03-15 MED ORDER — ESOMEPRAZOLE MAGNESIUM 40 MG PO CPDR: 40.0000 mg | DELAYED_RELEASE_CAPSULE | Freq: Two times a day (BID) | ORAL | Status: DC

## 2011-03-23 ENCOUNTER — Other Ambulatory Visit: Payer: Self-pay | Admitting: Family Medicine

## 2011-03-23 MED ORDER — BETHANECHOL CHLORIDE 5 MG PO TABS: 5.0000 mg | ORAL_TABLET | Freq: Two times a day (BID) | ORAL | Status: DC

## 2011-03-23 MED ORDER — RANITIDINE HCL 300 MG PO CAPS: 300.0000 mg | ORAL_CAPSULE | Freq: Every day | ORAL | Status: DC

## 2011-03-29 IMAGING — CR DG CHEST 2V
2 series · 2 of 2 positions shown · non-contrast
Comparison: 09/30/2010

CLINICAL DATA: Cough and fever

CHEST - 2 VIEW

[w chest lat]
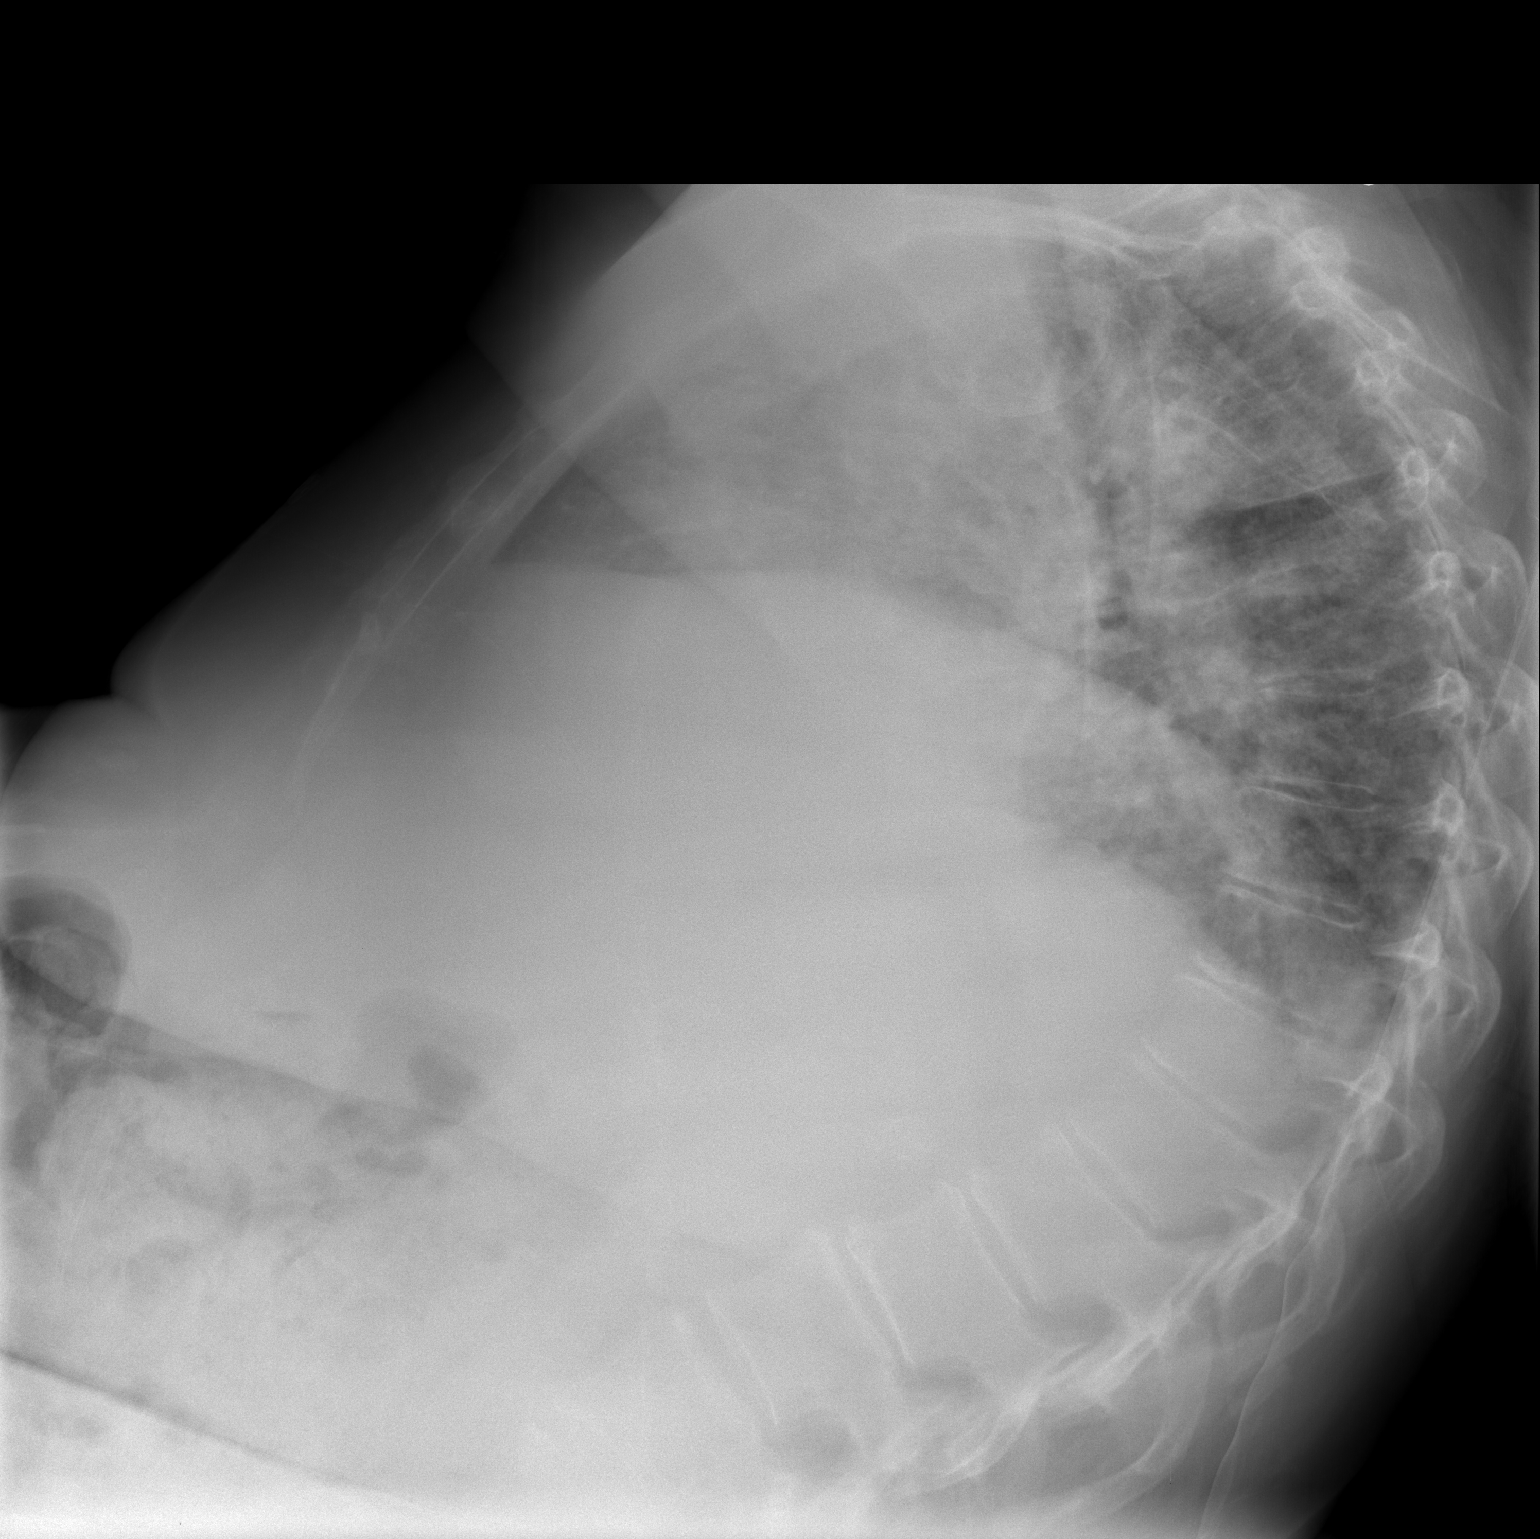

[view not recorded]
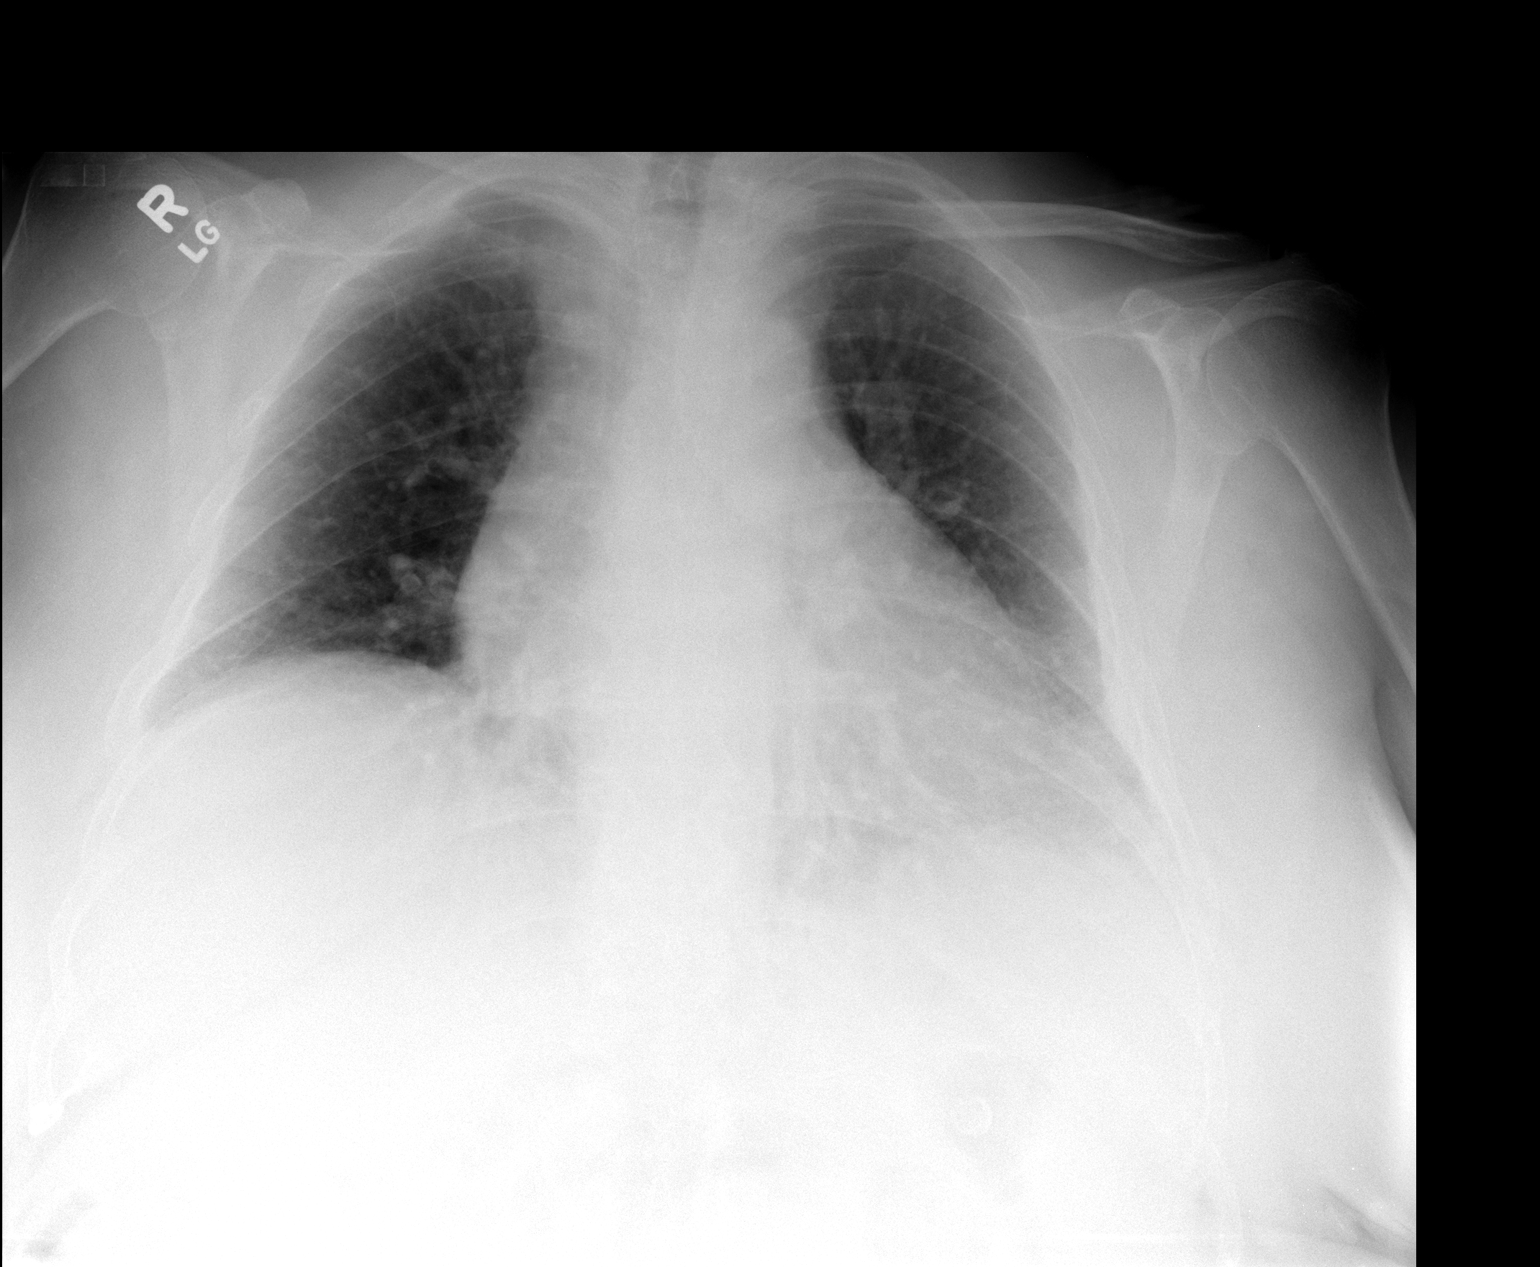

[2 of 2 positions shown; findings below may reference images not displayed]

FINDINGS: Moderate cardiomegaly.  Low volumes.  Bibasilar
atelectasis.  No pleural fluid.
IMPRESSION: Cardiomegaly.  Bibasilar atelectasis.

## 2011-04-14 ENCOUNTER — Other Ambulatory Visit: Payer: Self-pay | Admitting: Family Medicine

## 2011-04-14 MED ORDER — CLONIDINE HCL 0.2 MG PO TABS: 0.2000 mg | ORAL_TABLET | Freq: Two times a day (BID) | ORAL | Status: DC

## 2011-04-14 MED ORDER — FUROSEMIDE 80 MG PO TABS: 80.0000 mg | ORAL_TABLET | Freq: Two times a day (BID) | ORAL | Status: DC

## 2011-04-15 ENCOUNTER — Other Ambulatory Visit: Payer: Self-pay | Admitting: Family Medicine

## 2011-04-15 ENCOUNTER — Telehealth: Payer: Self-pay | Admitting: *Deleted

## 2011-04-15 MED ORDER — CLONIDINE HCL 0.2 MG PO TABS: 0.4000 mg | ORAL_TABLET | Freq: Every day | ORAL | Status: DC

## 2011-04-15 MED ORDER — CLONIDINE HCL 0.2 MG PO TABS: 0.2000 mg | ORAL_TABLET | Freq: Two times a day (BID) | ORAL | Status: DC

## 2011-04-15 NOTE — Telephone Encounter (Signed)
Pharmacy sent fax needing clarification on clonidine RX. There are two directions. Sent MD a text page to ask her to correct this.

## 2011-04-15 NOTE — Telephone Encounter (Signed)
Called pharmacy to discuss RX, corrected rx sent to pharmacy

## 2011-04-28 ENCOUNTER — Other Ambulatory Visit: Payer: Self-pay | Admitting: Family Medicine

## 2011-04-28 ENCOUNTER — Telehealth: Payer: Self-pay | Admitting: Family Medicine

## 2011-04-28 MED ORDER — SPIRONOLACTONE 100 MG PO TABS: 100.0000 mg | ORAL_TABLET | ORAL | Status: DC

## 2011-04-28 NOTE — Telephone Encounter (Signed)
I refilled the Aldactone. I called the house spoke with her niece we will arrange a followup visit within this month.

## 2011-05-11 ENCOUNTER — Emergency Department (HOSPITAL_COMMUNITY)
Admission: EM | Admit: 2011-05-11 | Discharge: 2011-05-12 | Disposition: A | Discharge status: Home / Self Care | Payer: Medicaid Other | Attending: Emergency Medicine | Admitting: Emergency Medicine

## 2011-05-11 DIAGNOSIS — Z79899 Other long term (current) drug therapy: Secondary | ICD-10-CM | POA: Insufficient documentation

## 2011-05-11 DIAGNOSIS — Z862 Personal history of diseases of the blood and blood-forming organs and certain disorders involving the immune mechanism: Secondary | ICD-10-CM | POA: Insufficient documentation

## 2011-05-11 DIAGNOSIS — R42 Dizziness and giddiness: Secondary | ICD-10-CM | POA: Insufficient documentation

## 2011-05-11 DIAGNOSIS — I10 Essential (primary) hypertension: Secondary | ICD-10-CM | POA: Insufficient documentation

## 2011-05-11 DIAGNOSIS — J4489 Other specified chronic obstructive pulmonary disease: Secondary | ICD-10-CM | POA: Insufficient documentation

## 2011-05-11 DIAGNOSIS — R609 Edema, unspecified: Secondary | ICD-10-CM | POA: Insufficient documentation

## 2011-05-11 DIAGNOSIS — Z8639 Personal history of other endocrine, nutritional and metabolic disease: Secondary | ICD-10-CM | POA: Insufficient documentation

## 2011-05-11 DIAGNOSIS — L97509 Non-pressure chronic ulcer of other part of unspecified foot with unspecified severity: Secondary | ICD-10-CM | POA: Insufficient documentation

## 2011-05-11 DIAGNOSIS — Z794 Long term (current) use of insulin: Secondary | ICD-10-CM | POA: Insufficient documentation

## 2011-05-11 DIAGNOSIS — E1169 Type 2 diabetes mellitus with other specified complication: Secondary | ICD-10-CM | POA: Insufficient documentation

## 2011-05-11 DIAGNOSIS — K219 Gastro-esophageal reflux disease without esophagitis: Secondary | ICD-10-CM | POA: Insufficient documentation

## 2011-05-11 DIAGNOSIS — M79609 Pain in unspecified limb: Secondary | ICD-10-CM | POA: Insufficient documentation

## 2011-05-11 DIAGNOSIS — J449 Chronic obstructive pulmonary disease, unspecified: Secondary | ICD-10-CM | POA: Insufficient documentation

## 2011-05-11 DIAGNOSIS — N390 Urinary tract infection, site not specified: Secondary | ICD-10-CM | POA: Insufficient documentation

## 2011-05-11 DIAGNOSIS — E669 Obesity, unspecified: Secondary | ICD-10-CM | POA: Insufficient documentation

## 2011-05-11 DIAGNOSIS — F0390 Unspecified dementia without behavioral disturbance: Secondary | ICD-10-CM | POA: Insufficient documentation

## 2011-05-11 DIAGNOSIS — Z86711 Personal history of pulmonary embolism: Secondary | ICD-10-CM | POA: Insufficient documentation

## 2011-05-11 DIAGNOSIS — G8929 Other chronic pain: Secondary | ICD-10-CM | POA: Insufficient documentation

## 2011-05-11 DIAGNOSIS — M7989 Other specified soft tissue disorders: Secondary | ICD-10-CM | POA: Insufficient documentation

## 2011-05-11 DIAGNOSIS — R5381 Other malaise: Secondary | ICD-10-CM | POA: Insufficient documentation

## 2011-05-11 LAB — GLUCOSE, CAPILLARY: Glucose-Capillary: 507 mg/dL — ABNORMAL HIGH (ref 70–99)

## 2011-05-12 ENCOUNTER — Emergency Department (HOSPITAL_COMMUNITY): Payer: Medicaid Other

## 2011-05-12 LAB — DIFFERENTIAL
Basophils Absolute: 0 10*3/uL (ref 0.0–0.1)
Basophils Relative: 0 % (ref 0–1)
Eosinophils Absolute: 0.1 10*3/uL (ref 0.0–0.7)
Eosinophils Relative: 1 % (ref 0–5)
Lymphocytes Relative: 16 % (ref 12–46)
Lymphs Abs: 1.5 10*3/uL (ref 0.7–4.0)
Monocytes Absolute: 0.2 10*3/uL (ref 0.1–1.0)
Monocytes Relative: 3 % (ref 3–12)
Neutro Abs: 7.6 10*3/uL (ref 1.7–7.7)
Neutrophils Relative %: 80 % — ABNORMAL HIGH (ref 43–77)

## 2011-05-12 LAB — URINALYSIS, ROUTINE W REFLEX MICROSCOPIC
Bilirubin Urine: NEGATIVE
Glucose, UA: >1000 mg/dL — AB
Hgb urine dipstick: NEGATIVE
Ketones, ur: NEGATIVE mg/dL
Nitrite: NEGATIVE
Protein, ur: NEGATIVE mg/dL
Specific Gravity, Urine: 1.018 (ref 1.005–1.030)
Urobilinogen, UA: 1 mg/dL (ref 0.0–1.0)
pH: 5.5 (ref 5.0–8.0)

## 2011-05-12 LAB — CBC
HCT: 36.3 % (ref 36.0–46.0)
Hemoglobin: 13.1 g/dL (ref 12.0–15.0)
MCH: 36.5 pg — ABNORMAL HIGH (ref 26.0–34.0)
MCHC: 36.1 g/dL — ABNORMAL HIGH (ref 30.0–36.0)
MCV: 101.1 fL — ABNORMAL HIGH (ref 78.0–100.0)
Platelets: 172 10*3/uL (ref 150–400)
RBC: 3.59 MIL/uL — ABNORMAL LOW (ref 3.87–5.11)
RDW: 14 % (ref 11.5–15.5)
WBC: 9.4 10*3/uL (ref 4.0–10.5)

## 2011-05-12 LAB — BASIC METABOLIC PANEL
BUN: 33 mg/dL — ABNORMAL HIGH (ref 6–23)
CO2: 30 mEq/L (ref 19–32)
Calcium: 9.8 mg/dL (ref 8.4–10.5)
Chloride: 89 mEq/L — ABNORMAL LOW (ref 96–112)
Creatinine, Ser: 1.15 mg/dL — ABNORMAL HIGH (ref 0.50–1.10)
GFR calc Af Amer: 58 mL/min — ABNORMAL LOW (ref >60)
GFR calc non Af Amer: 48 mL/min — ABNORMAL LOW (ref >60)
Glucose, Bld: 419 mg/dL — ABNORMAL HIGH (ref 70–99)
Potassium: 3.1 mEq/L — ABNORMAL LOW (ref 3.5–5.1)
Sodium: 130 mEq/L — ABNORMAL LOW (ref 135–145)

## 2011-05-12 LAB — URINE MICROSCOPIC-ADD ON

## 2011-05-12 LAB — GLUCOSE, CAPILLARY: Glucose-Capillary: 218 mg/dL — ABNORMAL HIGH (ref 70–99)

## 2011-05-14 ENCOUNTER — Other Ambulatory Visit: Payer: Self-pay | Admitting: Family Medicine

## 2011-05-14 MED ORDER — DOXEPIN HCL 25 MG PO CAPS: 25.0000 mg | ORAL_CAPSULE | Freq: Every day | ORAL | Status: DC

## 2011-05-14 MED ORDER — FUROSEMIDE 80 MG PO TABS: 80.0000 mg | ORAL_TABLET | Freq: Two times a day (BID) | ORAL | Status: DC

## 2011-05-14 MED ORDER — ESCITALOPRAM OXALATE 10 MG PO TABS: 10.0000 mg | ORAL_TABLET | Freq: Every day | ORAL | Status: DC

## 2011-05-17 ENCOUNTER — Telehealth: Payer: Self-pay | Admitting: Family Medicine

## 2011-05-17 ENCOUNTER — Other Ambulatory Visit: Payer: Self-pay | Admitting: Family Medicine

## 2011-05-17 MED ORDER — GLIPIZIDE ER 2.5 MG PO TB24: 2.5000 mg | ORAL_TABLET | Freq: Every day | ORAL | Status: DC

## 2011-05-17 MED ORDER — DOXEPIN HCL 25 MG PO CAPS: 25.0000 mg | ORAL_CAPSULE | Freq: Every day | ORAL | Status: DC

## 2011-05-17 NOTE — Telephone Encounter (Signed)
Called. Clarene Critchley reports, that pt has appt on Wednesday with Dr.Corey. Fwd. To Dr.Corey for review. Javier Glazier, Gerrit Heck

## 2011-05-17 NOTE — Telephone Encounter (Signed)
Spoke with [employer]. Pt has been on Doxepin 125mg  and needs the 100mg  tabs refilled. Also, pt has been on Glipizide 10 mg daily not 2.5 mg so that needs to be clarified. Thank You.

## 2011-05-17 NOTE — Telephone Encounter (Signed)
[name] is called upset becauses of the changes to her Aunts medications:  Glipizide 10 mg not 5mg  and Doxepin 100mg  not 25mg .

## 2011-05-17 NOTE — Telephone Encounter (Signed)
I am also confused. We have an appointment Wednesday. We will clarify then.

## 2011-05-19 ENCOUNTER — Encounter: Payer: Self-pay | Admitting: Family Medicine

## 2011-05-19 ENCOUNTER — Other Ambulatory Visit: Payer: Self-pay | Admitting: Family Medicine

## 2011-05-19 ENCOUNTER — Ambulatory Visit (INDEPENDENT_AMBULATORY_CARE_PROVIDER_SITE_OTHER): Payer: Self-pay | Admitting: Family Medicine

## 2011-05-19 VITALS — BP 112/68 | Temp 98.2°F | Ht 61.0 in | Wt 196.0 lb

## 2011-05-19 DIAGNOSIS — E1169 Type 2 diabetes mellitus with other specified complication: Secondary | ICD-10-CM

## 2011-05-19 DIAGNOSIS — E1065 Type 1 diabetes mellitus with hyperglycemia: Secondary | ICD-10-CM

## 2011-05-19 DIAGNOSIS — E11621 Type 2 diabetes mellitus with foot ulcer: Secondary | ICD-10-CM

## 2011-05-19 DIAGNOSIS — IMO0002 Reserved for concepts with insufficient information to code with codable children: Secondary | ICD-10-CM

## 2011-05-19 DIAGNOSIS — L97509 Non-pressure chronic ulcer of other part of unspecified foot with unspecified severity: Secondary | ICD-10-CM

## 2011-05-19 DIAGNOSIS — Z23 Encounter for immunization: Secondary | ICD-10-CM

## 2011-05-19 LAB — POCT GLYCOSYLATED HEMOGLOBIN (HGB A1C): Hemoglobin A1C: 10.2

## 2011-05-19 MED ORDER — DOXEPIN HCL 100 MG PO CAPS: 100.0000 mg | ORAL_CAPSULE | Freq: Every day | ORAL | Status: DC

## 2011-05-19 MED ORDER — GLIPIZIDE ER 10 MG PO TB24: 10.0000 mg | ORAL_TABLET | Freq: Every day | ORAL | Status: DC

## 2011-05-19 NOTE — Assessment & Plan Note (Signed)
Not currently well controlled currently. Family is resistant to changing medications. Lab Results  Component Value Date   HGBA1C 10.2 05/19/2011   plan to refer to Dr. Grayce Sessions  pharmacy clinic.  We'll consider adjusting NovoLog dose.  This may be the best we can do.

## 2011-05-19 NOTE — Assessment & Plan Note (Signed)
New please see picture and description above. Currently being well treated. We'll encourage continuation of antibiotic ointment and dressing. We'll followup in 2 weeks. Gave warning signs for earlier followup.

## 2011-05-19 NOTE — Progress Notes (Signed)
[name] presents to clinic today to followup her chronic medical problems and a foot ulcer.  Diabetes: Currently taking glipizide XL 100, NovoLog 15 units in the morning and 40 units with lunch and dinner, Lantus 60 units 3 times a day. Glucoses are usually in the 200s and can be as high as 300. She has had one low of 81. She denies polyuria.  Foot ulcer. Was noted when she was in the emergency room 2 weeks ago. It's on the lateral plantar aspect of her right foot, underneath the fourth and fifth metatarsal heads. He has improved with application of antibiotic ointment and bandage. She denies any pain and does not have sensation to her feet. No fevers or chills.  PMH reviewed.  ROS as above otherwise neg Medications reviewed.  Exam:  BP 112/68  Temp(Src) 98.2 F (36.8 C) (Oral)  Ht 5\' 1"  (1.549 m)  Wt 196 lb (88.905 kg)  BMI 37.03 kg/m2 Gen: Well NAD, obese HEENT: EOMI,  MMM Lungs: CTABL Nl WOB Heart: RRR no MRG Abd: NABS, NT, ND Exts: Non-edematous. Feet bilaterally have 0/4 monofilament sensation. Nails are thickened bilaterally. No ulcers on left foot. Right foot small 5 mm ulcer as described above. No surrounding erythema. Please see picture below.

## 2011-05-19 NOTE — Patient Instructions (Signed)
Thank you for coming in today. Please make an appointment with Dr. Valentina Lucks for your diabetes.  Keep applying the ointment to the foot wound.  Come back in 2 weeks for a recheck.

## 2011-05-21 ENCOUNTER — Other Ambulatory Visit: Payer: Self-pay | Admitting: Family Medicine

## 2011-05-21 MED ORDER — INSULIN ASPART 100 UNIT/ML ~~LOC~~ SOLN: 15.0000 [IU] | Freq: Three times a day (TID) | SUBCUTANEOUS | Status: DC

## 2011-05-21 MED ORDER — HYDROCODONE-ACETAMINOPHEN 5-325 MG PO TABS: 1.0000 | ORAL_TABLET | Freq: Four times a day (QID) | ORAL | Status: DC | PRN

## 2011-05-25 ENCOUNTER — Other Ambulatory Visit: Payer: Self-pay | Admitting: Family Medicine

## 2011-05-25 LAB — BASIC METABOLIC PANEL
BUN: 3 — ABNORMAL LOW
BUN: 4 — ABNORMAL LOW
BUN: 6
BUN: 8
BUN: 9
CO2: 27
CO2: 29
CO2: 30
CO2: 31
CO2: 32
Calcium: 8.4
Calcium: 8.5
Calcium: 8.6
Calcium: 8.8
Calcium: 8.9
Chloride: 103
Chloride: 107
Chloride: 96
Chloride: 97
Chloride: 97
Creatinine, Ser: 0.67
Creatinine, Ser: 0.68
Creatinine, Ser: 0.85
Creatinine, Ser: 0.86
Creatinine, Ser: 0.96
GFR calc Af Amer: >60
GFR calc Af Amer: >60
GFR calc Af Amer: >60
GFR calc Af Amer: >60
GFR calc Af Amer: >60
GFR calc non Af Amer: >60
GFR calc non Af Amer: >60
GFR calc non Af Amer: >60
GFR calc non Af Amer: >60
GFR calc non Af Amer: >60
Glucose, Bld: 113 — ABNORMAL HIGH
Glucose, Bld: 133 — ABNORMAL HIGH
Glucose, Bld: 152 — ABNORMAL HIGH
Glucose, Bld: 168 — ABNORMAL HIGH
Glucose, Bld: 202 — ABNORMAL HIGH
Potassium: 2.5 — CL
Potassium: 2.6 — CL
Potassium: 3 — ABNORMAL LOW
Potassium: 3.1 — ABNORMAL LOW
Potassium: 3.7
Sodium: 134 — ABNORMAL LOW
Sodium: 136
Sodium: 139
Sodium: 141
Sodium: 141

## 2011-05-25 LAB — CBC
HCT: 23.5 — ABNORMAL LOW
HCT: 30.6 — ABNORMAL LOW
HCT: 30.8 — ABNORMAL LOW
Hemoglobin: 10.7 — ABNORMAL LOW
Hemoglobin: 10.8 — ABNORMAL LOW
Hemoglobin: 8.3 — ABNORMAL LOW
MCHC: 34.8
MCHC: 35.2
MCHC: 35.5
MCV: 97
MCV: 99.3
MCV: 99.3
Platelets: 164
Platelets: 165
Platelets: 263
RBC: 2.36 — ABNORMAL LOW
RBC: 3.1 — ABNORMAL LOW
RBC: 3.16 — ABNORMAL LOW
RDW: 17 — ABNORMAL HIGH
RDW: 17.3 — ABNORMAL HIGH
RDW: 17.4 — ABNORMAL HIGH
WBC: 4.4
WBC: 5
WBC: 5.9

## 2011-05-25 LAB — CROSSMATCH
ABO/RH(D): A POS
Antibody Screen: NEGATIVE

## 2011-05-25 LAB — TYPE AND SCREEN
ABO/RH(D): A POS
Antibody Screen: NEGATIVE

## 2011-05-25 LAB — COMPREHENSIVE METABOLIC PANEL
ALT: 41 — ABNORMAL HIGH
AST: 29
Albumin: 3.8
Alkaline Phosphatase: 71
BUN: 27 — ABNORMAL HIGH
CO2: 30
Calcium: 9.8
Chloride: 99
Creatinine, Ser: 1.24 — ABNORMAL HIGH
GFR calc Af Amer: 54 — ABNORMAL LOW
GFR calc non Af Amer: 45 — ABNORMAL LOW
Glucose, Bld: 167 — ABNORMAL HIGH
Potassium: 3.1 — ABNORMAL LOW
Sodium: 142
Total Bilirubin: 1
Total Protein: 6

## 2011-05-25 LAB — URINALYSIS, ROUTINE W REFLEX MICROSCOPIC
Bilirubin Urine: NEGATIVE
Glucose, UA: NEGATIVE
Hgb urine dipstick: NEGATIVE
Ketones, ur: NEGATIVE
Nitrite: POSITIVE — AB
Protein, ur: NEGATIVE
Specific Gravity, Urine: 1.007
Urobilinogen, UA: 0.2
pH: 6.5

## 2011-05-25 LAB — HEMOGLOBIN AND HEMATOCRIT, BLOOD
HCT: 32.1 — ABNORMAL LOW
Hemoglobin: 11.1 — ABNORMAL LOW

## 2011-05-25 LAB — POTASSIUM: Potassium: 2.9 — ABNORMAL LOW

## 2011-05-25 LAB — PROTIME-INR
INR: 1
Prothrombin Time: 12.9

## 2011-05-25 LAB — APTT: aPTT: 28

## 2011-05-25 LAB — URINE MICROSCOPIC-ADD ON

## 2011-05-25 MED ORDER — FUROSEMIDE 80 MG PO TABS: 80.0000 mg | ORAL_TABLET | Freq: Two times a day (BID) | ORAL | Status: DC

## 2011-05-25 MED ORDER — GLIPIZIDE ER 10 MG PO TB24: 10.0000 mg | ORAL_TABLET | Freq: Every day | ORAL | Status: DC

## 2011-05-25 MED ORDER — ESCITALOPRAM OXALATE 10 MG PO TABS: 10.0000 mg | ORAL_TABLET | Freq: Every day | ORAL | Status: DC

## 2011-05-27 LAB — CBC
HCT: 29.6 — ABNORMAL LOW
HCT: 30.8 — ABNORMAL LOW
HCT: 31.4 — ABNORMAL LOW
HCT: 32 — ABNORMAL LOW
HCT: 33.5 — ABNORMAL LOW
HCT: 32.6 — ABNORMAL LOW
HCT: 31.4 — ABNORMAL LOW
HCT: 31.6 — ABNORMAL LOW
HCT: 40.3
Hemoglobin: 10.2 — ABNORMAL LOW
Hemoglobin: 10.3 — ABNORMAL LOW
Hemoglobin: 10.6 — ABNORMAL LOW
Hemoglobin: 10.9 — ABNORMAL LOW
Hemoglobin: 11.3 — ABNORMAL LOW
Hemoglobin: 11.3 — ABNORMAL LOW
Hemoglobin: 10.8 — ABNORMAL LOW
Hemoglobin: 11.1 — ABNORMAL LOW
Hemoglobin: 14.1
MCHC: 33.5
MCHC: 33.7
MCHC: 33.8
MCHC: 34.2
MCHC: 34.3
MCHC: 34.7
MCHC: 34
MCHC: 35.1
MCHC: 35.1
MCV: 102.5 — ABNORMAL HIGH
MCV: 102.5 — ABNORMAL HIGH
MCV: 103.2 — ABNORMAL HIGH
MCV: 103.8 — ABNORMAL HIGH
MCV: 104.9 — ABNORMAL HIGH
MCV: 102.1 — ABNORMAL HIGH
MCV: 101.3 — ABNORMAL HIGH
MCV: 101.6 — ABNORMAL HIGH
MCV: 103.3 — ABNORMAL HIGH
Platelets: 275
Platelets: 297
Platelets: 304
Platelets: 316
Platelets: 348
Platelets: 383
Platelets: 188
Platelets: 197
Platelets: 252
RBC: 2.89 — ABNORMAL LOW
RBC: 2.93 — ABNORMAL LOW
RBC: 3.04 — ABNORMAL LOW
RBC: 3.12 — ABNORMAL LOW
RBC: 3.23 — ABNORMAL LOW
RBC: 3.19 — ABNORMAL LOW
RBC: 3.06 — ABNORMAL LOW
RBC: 3.1 — ABNORMAL LOW
RBC: 3.96
RDW: 14.9 — ABNORMAL HIGH
RDW: 15.6 — ABNORMAL HIGH
RDW: 15.6 — ABNORMAL HIGH
RDW: 15.7 — ABNORMAL HIGH
RDW: 16.4 — ABNORMAL HIGH
RDW: 15.5 — ABNORMAL HIGH
RDW: 13.7
RDW: 14.9 — ABNORMAL HIGH
RDW: 14.9 — ABNORMAL HIGH
WBC: 5.9
WBC: 6.6
WBC: 6.7
WBC: 7
WBC: 8.9
WBC: 10.2
WBC: 10.1
WBC: 8.2
WBC: 8.7

## 2011-05-27 LAB — GRAM STAIN

## 2011-05-27 LAB — BLOOD GAS, ARTERIAL
Acid-Base Excess: 7.7 — ABNORMAL HIGH
Bicarbonate: 21.9
Bicarbonate: 31.2 — ABNORMAL HIGH
Drawn by: 244871
Drawn by: 295541
FIO2: 0.21
FIO2: 0.28
O2 Saturation: 92
O2 Saturation: 94.3
Patient temperature: 99.9
Patient temperature: 98.6
TCO2: 22.9
TCO2: 27.9
pCO2 arterial: 34.4 — ABNORMAL LOW
pCO2 arterial: 40.2
pH, Arterial: 7.424 — ABNORMAL HIGH
pH, Arterial: 7.502 — ABNORMAL HIGH
pO2, Arterial: 72.1 — ABNORMAL LOW
pO2, Arterial: 77.8 — ABNORMAL LOW

## 2011-05-27 LAB — RAPID URINE DRUG SCREEN, HOSP PERFORMED
Amphetamines: NOT DETECTED
Barbiturates: NOT DETECTED
Benzodiazepines: NOT DETECTED
Cocaine: NOT DETECTED
Opiates: POSITIVE — AB
Tetrahydrocannabinol: NOT DETECTED

## 2011-05-27 LAB — URINALYSIS, ROUTINE W REFLEX MICROSCOPIC
Bilirubin Urine: NEGATIVE
Bilirubin Urine: NEGATIVE
Glucose, UA: NEGATIVE
Glucose, UA: NEGATIVE
Hgb urine dipstick: NEGATIVE
Hgb urine dipstick: NEGATIVE
Ketones, ur: NEGATIVE
Ketones, ur: NEGATIVE
Nitrite: NEGATIVE
Nitrite: NEGATIVE
Protein, ur: NEGATIVE
Protein, ur: NEGATIVE
Specific Gravity, Urine: 1.008
Specific Gravity, Urine: 1.016
Urobilinogen, UA: 0.2
Urobilinogen, UA: 0.2
pH: 6.5
pH: 7

## 2011-05-27 LAB — BASIC METABOLIC PANEL
BUN: 11
BUN: 14
BUN: 9
BUN: 13
BUN: 23
BUN: 4 — ABNORMAL LOW
BUN: 4 — ABNORMAL LOW
BUN: 8
CO2: 27
CO2: 28
CO2: 29
CO2: 33 — ABNORMAL HIGH
CO2: 33 — ABNORMAL HIGH
CO2: 34 — ABNORMAL HIGH
CO2: 36 — ABNORMAL HIGH
CO2: 36 — ABNORMAL HIGH
Calcium: 8.8
Calcium: 9.2
Calcium: 9.8
Calcium: 8.3 — ABNORMAL LOW
Calcium: 8.6
Calcium: 8.7
Calcium: 8.8
Calcium: 9.3
Chloride: 102
Chloride: 104
Chloride: 104
Chloride: 100
Chloride: 93 — ABNORMAL LOW
Chloride: 94 — ABNORMAL LOW
Chloride: 94 — ABNORMAL LOW
Chloride: 96
Creatinine, Ser: 0.6
Creatinine, Ser: 0.75
Creatinine, Ser: 0.79
Creatinine, Ser: 0.68
Creatinine, Ser: 0.78
Creatinine, Ser: 0.8
Creatinine, Ser: 0.89
Creatinine, Ser: 0.97
GFR calc Af Amer: >60
GFR calc Af Amer: >60
GFR calc Af Amer: >60
GFR calc Af Amer: >60
GFR calc Af Amer: >60
GFR calc Af Amer: >60
GFR calc Af Amer: >60
GFR calc Af Amer: >60
GFR calc non Af Amer: >60
GFR calc non Af Amer: >60
GFR calc non Af Amer: >60
GFR calc non Af Amer: 60 — ABNORMAL LOW
GFR calc non Af Amer: >60
GFR calc non Af Amer: >60
GFR calc non Af Amer: >60
GFR calc non Af Amer: >60
Glucose, Bld: 127 — ABNORMAL HIGH
Glucose, Bld: 148 — ABNORMAL HIGH
Glucose, Bld: 220 — ABNORMAL HIGH
Glucose, Bld: 135 — ABNORMAL HIGH
Glucose, Bld: 155 — ABNORMAL HIGH
Glucose, Bld: 159 — ABNORMAL HIGH
Glucose, Bld: 189 — ABNORMAL HIGH
Glucose, Bld: 211 — ABNORMAL HIGH
Potassium: 2.5 — CL
Potassium: 3.4 — ABNORMAL LOW
Potassium: 3.8
Potassium: 2 — CL
Potassium: 2 — CL
Potassium: 2.3 — CL
Potassium: 2.9 — ABNORMAL LOW
Potassium: 3 — ABNORMAL LOW
Sodium: 138
Sodium: 142
Sodium: 143
Sodium: 137
Sodium: 139
Sodium: 139
Sodium: 139
Sodium: 140

## 2011-05-27 LAB — COMPREHENSIVE METABOLIC PANEL
ALT: 57 — ABNORMAL HIGH
ALT: 64 — ABNORMAL HIGH
ALT: 68 — ABNORMAL HIGH
ALT: 101 — ABNORMAL HIGH
ALT: 59 — ABNORMAL HIGH
AST: 29
AST: 30
AST: 32
AST: 46 — ABNORMAL HIGH
AST: 39 — ABNORMAL HIGH
Albumin: 2.9 — ABNORMAL LOW
Albumin: 2.9 — ABNORMAL LOW
Albumin: 3.2 — ABNORMAL LOW
Albumin: 3 — ABNORMAL LOW
Albumin: 3.8
Alkaline Phosphatase: 57
Alkaline Phosphatase: 58
Alkaline Phosphatase: 65
Alkaline Phosphatase: 69
Alkaline Phosphatase: 54
BUN: 15
BUN: 16
BUN: 19
BUN: 16
BUN: 26 — ABNORMAL HIGH
CO2: 25
CO2: 26
CO2: 26
CO2: 31
CO2: 33 — ABNORMAL HIGH
Calcium: 9.1
Calcium: 9.2
Calcium: 9.4
Calcium: 9.4
Calcium: 9.7
Chloride: 109
Chloride: 109
Chloride: 111
Chloride: 99
Chloride: 94 — ABNORMAL LOW
Creatinine, Ser: 0.8
Creatinine, Ser: 0.84
Creatinine, Ser: 0.91
Creatinine, Ser: 0.82
Creatinine, Ser: 0.92
GFR calc Af Amer: >60
GFR calc Af Amer: >60
GFR calc Af Amer: >60
GFR calc Af Amer: >60
GFR calc Af Amer: >60
GFR calc non Af Amer: >60
GFR calc non Af Amer: >60
GFR calc non Af Amer: >60
GFR calc non Af Amer: >60
GFR calc non Af Amer: >60
Glucose, Bld: 66 — ABNORMAL LOW
Glucose, Bld: 67 — ABNORMAL LOW
Glucose, Bld: 87
Glucose, Bld: 170 — ABNORMAL HIGH
Glucose, Bld: 158 — ABNORMAL HIGH
Potassium: 3.5
Potassium: 3.6
Potassium: 4.3
Potassium: 2.3 — CL
Potassium: 2.9 — ABNORMAL LOW
Sodium: 141
Sodium: 141
Sodium: 143
Sodium: 140
Sodium: 142
Total Bilirubin: 0.7
Total Bilirubin: 0.7
Total Bilirubin: 0.8
Total Bilirubin: 1
Total Bilirubin: 1.2
Total Protein: 5.1 — ABNORMAL LOW
Total Protein: 5.2 — ABNORMAL LOW
Total Protein: 5.7 — ABNORMAL LOW
Total Protein: 5.5 — ABNORMAL LOW
Total Protein: 6.5

## 2011-05-27 LAB — PROTIME-INR
INR: 0.9
Prothrombin Time: 12.4

## 2011-05-27 LAB — URINE CULTURE: Colony Count: >=100000

## 2011-05-27 LAB — DIFFERENTIAL
Basophils Absolute: 0
Basophils Absolute: 0
Basophils Relative: 1
Basophils Relative: 0
Eosinophils Absolute: 0.2
Eosinophils Absolute: 0.1
Eosinophils Relative: 3
Eosinophils Relative: 1
Lymphocytes Relative: 16
Lymphocytes Relative: 16
Lymphs Abs: 1.1
Lymphs Abs: 1.6
Monocytes Absolute: 0.5
Monocytes Absolute: 0.5
Monocytes Relative: 7
Monocytes Relative: 5
Neutro Abs: 4.8
Neutro Abs: 8 — ABNORMAL HIGH
Neutrophils Relative %: 73
Neutrophils Relative %: 79 — ABNORMAL HIGH

## 2011-05-27 LAB — CARDIAC PANEL(CRET KIN+CKTOT+MB+TROPI)
CK, MB: 1
CK, MB: 1.6
Relative Index: INVALID
Relative Index: INVALID
Total CK: 30
Total CK: 33
Troponin I: 0.02
Troponin I: 0.02

## 2011-05-27 LAB — TYPE AND SCREEN
ABO/RH(D): A POS
Antibody Screen: NEGATIVE

## 2011-05-27 LAB — URINE MICROSCOPIC-ADD ON

## 2011-05-27 LAB — ETHANOL: Alcohol, Ethyl (B): <5

## 2011-05-27 LAB — APTT: aPTT: 24

## 2011-05-27 LAB — URINALYSIS, DIPSTICK ONLY
Bilirubin Urine: NEGATIVE
Glucose, UA: NEGATIVE
Ketones, ur: 15 — AB
Nitrite: NEGATIVE
Protein, ur: 30 — AB
Specific Gravity, Urine: 1.026
Urobilinogen, UA: 0.2
pH: 6

## 2011-05-27 LAB — FOLATE RBC: RBC Folate: 1338 — ABNORMAL HIGH

## 2011-05-27 LAB — OCCULT BLOOD X 1 CARD TO LAB, STOOL: Fecal Occult Bld: POSITIVE

## 2011-05-27 LAB — VITAMIN B12: Vitamin B-12: 442 (ref 211–911)

## 2011-05-27 LAB — ABO/RH: ABO/RH(D): A POS

## 2011-05-27 LAB — D-DIMER, QUANTITATIVE: D-Dimer, Quant: 2.23 — ABNORMAL HIGH

## 2011-05-27 LAB — LACTATE DEHYDROGENASE: LDH: 287 — ABNORMAL HIGH

## 2011-05-27 LAB — HEMOGLOBIN A1C
Hgb A1c MFr Bld: 6.6 — ABNORMAL HIGH
Mean Plasma Glucose: 158

## 2011-05-27 LAB — MAGNESIUM: Magnesium: 1.9

## 2011-05-27 LAB — TSH
TSH: 1.593
TSH: 2.78

## 2011-05-31 ENCOUNTER — Ambulatory Visit (INDEPENDENT_AMBULATORY_CARE_PROVIDER_SITE_OTHER): Payer: Medicaid Other | Admitting: Pharmacist

## 2011-05-31 ENCOUNTER — Encounter: Payer: Self-pay | Admitting: Family Medicine

## 2011-05-31 ENCOUNTER — Encounter: Payer: Self-pay | Admitting: Pharmacist

## 2011-05-31 ENCOUNTER — Ambulatory Visit (INDEPENDENT_AMBULATORY_CARE_PROVIDER_SITE_OTHER): Payer: Medicaid Other | Admitting: Family Medicine

## 2011-05-31 VITALS — BP 108/72 | HR 124 | Ht 62.0 in | Wt 204.6 lb

## 2011-05-31 VITALS — BP 108/72 | HR 124 | Wt 204.0 lb

## 2011-05-31 DIAGNOSIS — E1065 Type 1 diabetes mellitus with hyperglycemia: Secondary | ICD-10-CM

## 2011-05-31 DIAGNOSIS — I872 Venous insufficiency (chronic) (peripheral): Secondary | ICD-10-CM

## 2011-05-31 DIAGNOSIS — E11621 Type 2 diabetes mellitus with foot ulcer: Secondary | ICD-10-CM

## 2011-05-31 DIAGNOSIS — E1169 Type 2 diabetes mellitus with other specified complication: Secondary | ICD-10-CM

## 2011-05-31 DIAGNOSIS — L97509 Non-pressure chronic ulcer of other part of unspecified foot with unspecified severity: Secondary | ICD-10-CM

## 2011-05-31 NOTE — Progress Notes (Signed)
  Subjective:    Patient ID: [name], female    DOB: 1951-01-09, 60 y.o.   MRN: [id number]  HPI  Reviewed and agree with Dr. Graylin Shiver management.    Review of Systems     Objective:   Physical Exam        Assessment & Plan:

## 2011-05-31 NOTE — Patient Instructions (Addendum)
Thank you for coming in today. We will get you into the wound clinic. Take a lunch time dose of lasix just today.  Come back and see me on Friday for a leg and foot recheck.  Apply antibioic ointment to that cut on the left shin.

## 2011-05-31 NOTE — Progress Notes (Signed)
Ms. Marinello presents to clinic today to followup her diabetes and her foot ulcer.  1) ulcer: Has been applying antibiotic ointment to the ulcer on her right base fifth metatarsal head. Ms. Lesnik thinks the pain is reduced and her family thinks the ulcer looks better. No fevers or chill.  Continues to wear her tennis shoes. Additionally she notes her dog jumped on her in bed this morning and his claw caused a small wound on her left shin.  2) diabetes: Was seen by Dr. Curt Bears today in pharmacy clinic for diabetes management. Was noted that Ms. Gilvin really had not been taking her insulin very much over the past several months. She has been taking her insulin since October 6 and her home glucose have been improved.  Dr. Curt Bears changed her Lantus and NovoLog. Please see his note for further details.  3) leg swelling: Bilateral lower sure these have been a little bit swollen recently. Ms. Lumia continues to take her diuretics is normal. Her niece says this is a normal pattern for her.   Exam:  BP 108/72  Pulse 124  Wt 204 lb (92.534 kg) Gen: Well NAD HEENT: EOMI,  MMM Lungs: CTABL Nl WOB Heart: RRR no MRG Abd: NABS, NT, ND Exts: 1-2+ edema bilateral lower sure these. Calves bilaterally are red appearing, and a bit tender.  There is a small wound on her left shin. It's a bout 2-3 mm in diameter. No bleeding. Right foot:  10 mm ulcer with a second macerated area more distally. No surrounding erythema. Mildly tender to palpation. Unable to probe to bone Please see picture below.

## 2011-05-31 NOTE — Assessment & Plan Note (Signed)
Bilateral leg swelling do to venous insufficiency. Plan to add one extra dose of Lasix today. Again we'll followup at the end of the week.

## 2011-05-31 NOTE — Assessment & Plan Note (Signed)
Worsening, despite adequate treatment. Plan: Refer to wound clinic. Additionally modified shoe with a foam pad to provide a horseshoe around the ulcer to provide cushioning to the area. I wanted to cut the lateral aspect of the shoe but the family would not let me. Plan to followup at the end of the week to make sure the orthotic fits well and she is improving.

## 2011-05-31 NOTE — Progress Notes (Signed)
  Subjective:    Patient ID: [name], female    DOB: 03/15/1951, 60 y.o.   MRN: [id number]  HPI 35 yoWF presents to pharmacy clinic for diabetes follow-up.  She is accompanied by her niece who helps take care of her and organizes her medications.  Pt is currently on metformin, glipizide, lantus, and novolog and checks cBGs QID.  Her paper log of cBGs shows a few lows in the 50s, mostly 100-200s, and occasional 300-500s.    Pt also taking low dose steroids for rheumatoid arthritis and gout and was seen last week for a diabetic foot ulcer.     Review of Systems     Objective:   Physical Exam        Assessment & Plan:   Diabetes of 4-5 yrs duration currently under poor control of blood glucose based on  . Lab Results  Component Value Date   HGBA1C 10.2 05/19/2011     ,home fasting CBG readings ranging from 50-200s and post-prandial up to 500. Control is suboptimal due to skipping insulin, erratic eating patterns, foot infection, and subtherapeutic insulin regimen.   Glipizide XL d/c'd due to high insulin requirements. Pts insulin regimen was adjusted to make dosage of long-acting and short-acting insulin more equal.  Decreased basal insulin Lantus (insulin glargine) from 40 units TID --> 35 units TID.  Increased short acting novolog from 15/40/40 units  to 20/40/50 units with breakfast, lunch, and dinner .  Pt counseled on importance of eating meals and taking insulin as prescribed, and allowing 3-4 hours between meals to reduce risk for hypoglycemia.  Written patient instructions provided and pt and niece verbalize understanding.  Follow up in  Pharmacist Clinic Visit in 4-6 weeks.   TTFFC 40 minutes.  Patient seen with Volanda Napoleon, PharmD Candidate and Ralene Bathe, Pharmacy Resident .

## 2011-05-31 NOTE — Assessment & Plan Note (Signed)
Diabetes of 4-5 yrs duration currently under poor control of blood glucose based on  . Lab Results  Component Value Date   HGBA1C 10.2 05/19/2011     ,home fasting CBG readings ranging from 50-200s and post-prandial up to 500. Control is suboptimal due to skipping insulin, erratic eating patterns, foot infection, and subtherapeutic insulin regimen.   Glipizide XL d/c'd due to high insulin requirements. Pts insulin regimen was adjusted to make dosage of long-acting and short-acting insulin more equal.  Decreased basal insulin Lantus (insulin glargine) from 40 units TID --> 35 units TID.  Increased short acting novolog from 15/40/40 units  to 20/40/50 units with breakfast, lunch, and dinner .  Pt counseled on importance of eating meals and taking insulin as prescribed, and allowing 3-4 hours between meals to reduce risk for hypoglycemia.  Written patient instructions provided and pt and niece verbalize understanding.  Follow up in  Pharmacist Clinic Visit in 4-6 weeks.   TTFFC 40 minutes.  Patient seen with Volanda Napoleon, PharmD Candidate and Ralene Bathe, Pharmacy Resident

## 2011-05-31 NOTE — Assessment & Plan Note (Signed)
Not well-controlled. Seen by Dr. Valentina Lucks  morning. Please see his plan in his note. We'll followup soon

## 2011-05-31 NOTE — Patient Instructions (Signed)
Stop taking Glipizide. Change Lantus to 35 units 3 times a day prior to meals. Change Novolog to 20 units before breakfast, 40 units before lunch and 50 units before dinner.

## 2011-06-04 ENCOUNTER — Ambulatory Visit: Payer: Medicaid Other | Admitting: Family Medicine

## 2011-06-10 ENCOUNTER — Telehealth: Payer: Self-pay | Admitting: *Deleted

## 2011-06-10 NOTE — Telephone Encounter (Signed)
Spoke with patient's niece, she feels that the wound is not getting any better and would like her to be seen before her appointment next week. I told her that we do not have any appointments tomorrow afternoon and that if she thinks it is that bad they should go to and urgent care center. Also asked patient to have medicaid card changed so that we are on it in order to refer her to the wound center, said she is in the process of having that done.Busick, Kevin Fenton

## 2011-06-16 ENCOUNTER — Ambulatory Visit: Payer: Medicaid Other | Admitting: Family Medicine

## 2011-06-16 ENCOUNTER — Other Ambulatory Visit: Payer: Self-pay | Admitting: Family Medicine

## 2011-06-16 MED ORDER — OXYBUTYNIN CHLORIDE 5 MG PO TABS: 5.0000 mg | ORAL_TABLET | Freq: Two times a day (BID) | ORAL | Status: DC

## 2011-06-16 MED ORDER — SIMVASTATIN 10 MG PO TABS: 10.0000 mg | ORAL_TABLET | Freq: Every day | ORAL | Status: DC

## 2011-06-24 ENCOUNTER — Encounter: Payer: Self-pay | Admitting: Family Medicine

## 2011-06-24 ENCOUNTER — Ambulatory Visit (INDEPENDENT_AMBULATORY_CARE_PROVIDER_SITE_OTHER): Payer: Self-pay | Admitting: Family Medicine

## 2011-06-24 VITALS — BP 118/76 | HR 90 | Temp 97.7°F | Ht 61.0 in | Wt 192.9 lb

## 2011-06-24 DIAGNOSIS — E11621 Type 2 diabetes mellitus with foot ulcer: Secondary | ICD-10-CM

## 2011-06-24 DIAGNOSIS — E1169 Type 2 diabetes mellitus with other specified complication: Secondary | ICD-10-CM

## 2011-06-24 DIAGNOSIS — L97509 Non-pressure chronic ulcer of other part of unspecified foot with unspecified severity: Secondary | ICD-10-CM

## 2011-06-24 MED ORDER — AMOXICILLIN-POT CLAVULANATE 875-125 MG PO TABS: 1.0000 | ORAL_TABLET | Freq: Two times a day (BID) | ORAL | Status: DC

## 2011-06-24 MED ORDER — MUPIROCIN 2 % EX OINT: TOPICAL_OINTMENT | Freq: Three times a day (TID) | CUTANEOUS | Status: DC

## 2011-06-24 NOTE — Assessment & Plan Note (Signed)
Now bilateral.  Resistant to conservative treatment. Plan for oral antibiotics with topical Bactroban. We'll use Augmentin.  Patient states that she's taking amoxicillin in the past without adverse reactions. Apply non-adherent dressing to both wounds. Advise staying off her feet. We'll continue to work to get her in to the wound care center. Followup in one week or less.

## 2011-06-24 NOTE — Patient Instructions (Signed)
Thank you for coming in today. Use the augmentin twice a day.  Come back next week.  Apply the antibiotic ointment daily under the dressing.  Use a non-adherent dressing or even a panty liner with a soft wrap. Keep off your feet and your feet elevated.  Try to get in with the wound clinic.

## 2011-06-24 NOTE — Progress Notes (Addendum)
Ms. Sitzer is here to followup her foot wounds.  In the interim she is noted worsening of her right lateral ulcer and a development of a new left lateral ulcer.  They've been applying Vaseline to the wounds, which have worsened. She denies any fevers or chills or significant pain to the wounds. She continues to attempt to get into the wound Center but is having an issue with Medicaid. She notes that she is moving soon and has been on her feet or moving around a lot recently.   Otherwise is feeling well today.    PMH reviewed.  ROS as above otherwise neg Medications reviewed. Current Outpatient Prescriptions  Medication Sig Dispense Refill  . albuterol (PROVENTIL) (5 MG/ML) 0.5% nebulizer solution Take 2.5 mg by nebulization every 4 (four) hours as needed. For wheezing or tightness       . albuterol (VENTOLIN HFA) 108 (90 BASE) MCG/ACT inhaler Inhale 1 puff into the lungs every 4 (four) hours.        [name] Kitchen alendronate (FOSAMAX) 70 MG tablet Take 70 mg by mouth every 7 (seven) days. Take in the morning with a full glass of water, on an empty stomach, and do not take anything else by mouth or lie down for the next 30 min.       [name] Kitchen allopurinol (ZYLOPRIM) 300 MG tablet Take 300 mg by mouth 2 (two) times daily.        . bethanechol (URECHOLINE) 5 MG tablet Take 1 tablet (5 mg total) by mouth 2 (two) times daily.  60 tablet  6  . cetirizine (ZYRTEC) 10 MG tablet Take 1 tablet (10 mg total) by mouth daily.  30 tablet  11  . Cholecalciferol 1000 UNIT capsule Take 1,000 Units by mouth daily.        . cloNIDine (CATAPRES) 0.2 MG tablet Take 2 tablets (0.4 mg total) by mouth at bedtime.  60 tablet  1  . doxepin (SINEQUAN) 100 MG capsule Take 1 capsule (100 mg total) by mouth at bedtime.  30 capsule  6  . doxepin (SINEQUAN) 25 MG capsule Take 1 capsule (25 mg total) by mouth at bedtime.  30 capsule  6  . EPINEPHrine (EPIPEN) 0.3 mg/0.3 mL DEVI Use as directed       . escitalopram (LEXAPRO) 10 MG tablet Take 1  tablet (10 mg total) by mouth daily.  31 tablet  6  . esomeprazole (NEXIUM) 40 MG capsule Take 1 capsule (40 mg total) by mouth 2 (two) times daily.  60 capsule  11  . ferrous sulfate 325 (65 FE) MG tablet Take 325 mg by mouth 3 (three) times daily.        . fluticasone (FLONASE) 50 MCG/ACT nasal spray 2 sprays by Nasal route daily. Each nostril       . Fluticasone-Salmeterol (ADVAIR DISKUS) 500-50 MCG/DOSE AEPB Inhale 1 puff into the lungs 2 (two) times daily.  60 each  6  . folic acid (FOLVITE) 1 MG tablet Take 1 mg by mouth daily. Take while on methotrexate       . furosemide (LASIX) 80 MG tablet Take 1 tablet (80 mg total) by mouth 2 (two) times daily.  60 tablet  6  . glucose blood (ASCENSIA ELITE TEST STRIPS) test strip Check blood sugar 4 times daily       . hydrochlorothiazide (HYDRODIURIL) 25 MG tablet Take 25 mg by mouth 2 (two) times daily.        [name] Kitchen HYDROcodone-acetaminophen (NORCO) 5-325  MG per tablet Take 1 tablet by mouth every 6 (six) hours as needed for pain. Take 1 tablet by mouth up to 4 times a day as needed for pain  30 tablet  2  . hydroxychloroquine (PLAQUENIL) 200 MG tablet Take 200 mg by mouth 2 (two) times daily.        . insulin aspart (NOVOLOG) 100 UNIT/ML injection Inject 20-50 Units into the skin 3 (three) times daily before meals. 20 units before breakfast & 40 units with lunch & 50 with pm meal      . insulin glargine (LANTUS) 100 UNIT/ML injection Inject 35 Units into the skin 3 (three) times daily. Give in am & in pm      . Insulin Syringe-Needle U-100 (B-D INS SYR ULTRAFINE 1CC/30G) 30G X 1/2" 1 ML MISC Dispense QS for 4 times per day dosing.       . metFORMIN (GLUCOPHAGE) 500 MG tablet Take 500 mg by mouth 2 (two) times daily.        . methotrexate 2.5 MG tablet Take by mouth as directed. Per Dr. Estrella Deeds pills on Saturday & on sunday       . Nebulizers (COMPRESSOR/NEBULIZER) MISC Per instructions       . nystatin (MYCOSTATIN) powder Apply topically 2 (two) times  daily. To groin & pannus prn      . oxybutynin (DITROPAN) 5 MG tablet Take 1 tablet (5 mg total) by mouth 2 (two) times daily.  180 tablet  3  . potassium chloride (KLOR-CON) 20 MEQ packet Take 4 tabs by mouth in the morning, 4 at night, and 4 at bedtime.      . predniSONE (DELTASONE) 5 MG tablet Take 5 mg by mouth daily.        . pregabalin (LYRICA) 100 MG capsule Take 1 capsule (100 mg total) by mouth 3 (three) times daily.  90 capsule  3  . ranitidine (ZANTAC) 300 MG capsule Take 1 capsule (300 mg total) by mouth daily.  31 capsule  6  . simvastatin (ZOCOR) 10 MG tablet Take 1 tablet (10 mg total) by mouth daily.  30 tablet  12  . spironolactone (ALDACTONE) 100 MG tablet Take 1 tablet (100 mg total) by mouth every morning.  30 tablet  1  . amoxicillin-clavulanate (AUGMENTIN) 875-125 MG per tablet Take 1 tablet by mouth 2 (two) times daily.  60 tablet  1  . mupirocin (BACTROBAN) 2 % ointment Apply topically 3 (three) times daily.  30 g  3   Exam:  BP 118/76  Pulse 90  Temp(Src) 97.7 F (36.5 C) (Oral)  Ht 5\' 1"  (1.549 m)  Wt 192 lb 14.4 oz (87.499 kg)  BMI 36.45 kg/m2 Gen: Well NAD HEENT: EOMI,  MMM Lungs: CTABL Nl WOB Heart: RRR no MRG Abd: NABS, NT, ND Feet: Right foot ulcer approximately 1 cm diameter with no bone probable. Nontender.      Left Foot:  New ulcer  on the plantar aspect of the head of the fifth metatarsal.  Nontender blood supply seems to be adequate. No bone probable an ulcer.

## 2011-06-27 ENCOUNTER — Inpatient Hospital Stay (HOSPITAL_COMMUNITY)
Admission: EM | Admit: 2011-06-27 | Discharge: 2011-07-01 | DRG: 074 | Disposition: A | Discharge status: Rehabilitation Facility | Payer: Medicaid Other | Attending: Family Medicine | Admitting: Family Medicine

## 2011-06-27 ENCOUNTER — Encounter (HOSPITAL_COMMUNITY): Payer: Self-pay | Admitting: *Deleted

## 2011-06-27 ENCOUNTER — Emergency Department (HOSPITAL_COMMUNITY): Payer: Medicaid Other

## 2011-06-27 ENCOUNTER — Other Ambulatory Visit: Payer: Self-pay

## 2011-06-27 DIAGNOSIS — R5381 Other malaise: Secondary | ICD-10-CM | POA: Diagnosis present

## 2011-06-27 DIAGNOSIS — L97509 Non-pressure chronic ulcer of other part of unspecified foot with unspecified severity: Secondary | ICD-10-CM | POA: Diagnosis present

## 2011-06-27 DIAGNOSIS — Z86711 Personal history of pulmonary embolism: Secondary | ICD-10-CM

## 2011-06-27 DIAGNOSIS — E1142 Type 2 diabetes mellitus with diabetic polyneuropathy: Secondary | ICD-10-CM | POA: Diagnosis present

## 2011-06-27 DIAGNOSIS — I872 Venous insufficiency (chronic) (peripheral): Secondary | ICD-10-CM | POA: Diagnosis present

## 2011-06-27 DIAGNOSIS — L02419 Cutaneous abscess of limb, unspecified: Secondary | ICD-10-CM | POA: Diagnosis present

## 2011-06-27 DIAGNOSIS — Z9181 History of falling: Secondary | ICD-10-CM

## 2011-06-27 DIAGNOSIS — E1165 Type 2 diabetes mellitus with hyperglycemia: Secondary | ICD-10-CM | POA: Diagnosis present

## 2011-06-27 DIAGNOSIS — M109 Gout, unspecified: Secondary | ICD-10-CM | POA: Diagnosis present

## 2011-06-27 DIAGNOSIS — I5032 Chronic diastolic (congestive) heart failure: Secondary | ICD-10-CM | POA: Diagnosis present

## 2011-06-27 DIAGNOSIS — M069 Rheumatoid arthritis, unspecified: Secondary | ICD-10-CM | POA: Diagnosis present

## 2011-06-27 DIAGNOSIS — E876 Hypokalemia: Secondary | ICD-10-CM

## 2011-06-27 DIAGNOSIS — N3941 Urge incontinence: Secondary | ICD-10-CM | POA: Diagnosis present

## 2011-06-27 DIAGNOSIS — E559 Vitamin D deficiency, unspecified: Secondary | ICD-10-CM | POA: Diagnosis present

## 2011-06-27 DIAGNOSIS — R0789 Other chest pain: Secondary | ICD-10-CM

## 2011-06-27 DIAGNOSIS — I5033 Acute on chronic diastolic (congestive) heart failure: Secondary | ICD-10-CM | POA: Diagnosis present

## 2011-06-27 DIAGNOSIS — E11621 Type 2 diabetes mellitus with foot ulcer: Secondary | ICD-10-CM | POA: Diagnosis present

## 2011-06-27 DIAGNOSIS — G4733 Obstructive sleep apnea (adult) (pediatric): Secondary | ICD-10-CM | POA: Diagnosis present

## 2011-06-27 DIAGNOSIS — L03119 Cellulitis of unspecified part of limb: Secondary | ICD-10-CM | POA: Diagnosis present

## 2011-06-27 DIAGNOSIS — R5383 Other fatigue: Secondary | ICD-10-CM

## 2011-06-27 DIAGNOSIS — D539 Nutritional anemia, unspecified: Secondary | ICD-10-CM | POA: Diagnosis not present

## 2011-06-27 DIAGNOSIS — M549 Dorsalgia, unspecified: Secondary | ICD-10-CM | POA: Diagnosis present

## 2011-06-27 DIAGNOSIS — F341 Dysthymic disorder: Secondary | ICD-10-CM | POA: Diagnosis present

## 2011-06-27 DIAGNOSIS — F209 Schizophrenia, unspecified: Secondary | ICD-10-CM | POA: Diagnosis present

## 2011-06-27 DIAGNOSIS — M159 Polyosteoarthritis, unspecified: Secondary | ICD-10-CM | POA: Diagnosis present

## 2011-06-27 DIAGNOSIS — M48062 Spinal stenosis, lumbar region with neurogenic claudication: Secondary | ICD-10-CM | POA: Diagnosis present

## 2011-06-27 DIAGNOSIS — IMO0002 Reserved for concepts with insufficient information to code with codable children: Secondary | ICD-10-CM

## 2011-06-27 DIAGNOSIS — E1169 Type 2 diabetes mellitus with other specified complication: Secondary | ICD-10-CM | POA: Diagnosis present

## 2011-06-27 DIAGNOSIS — Z79899 Other long term (current) drug therapy: Secondary | ICD-10-CM

## 2011-06-27 DIAGNOSIS — J45909 Unspecified asthma, uncomplicated: Secondary | ICD-10-CM | POA: Diagnosis present

## 2011-06-27 DIAGNOSIS — I509 Heart failure, unspecified: Secondary | ICD-10-CM | POA: Diagnosis present

## 2011-06-27 DIAGNOSIS — K5909 Other constipation: Secondary | ICD-10-CM | POA: Diagnosis present

## 2011-06-27 DIAGNOSIS — IMO0001 Reserved for inherently not codable concepts without codable children: Secondary | ICD-10-CM | POA: Diagnosis present

## 2011-06-27 DIAGNOSIS — R531 Weakness: Secondary | ICD-10-CM

## 2011-06-27 DIAGNOSIS — E785 Hyperlipidemia, unspecified: Secondary | ICD-10-CM | POA: Diagnosis present

## 2011-06-27 DIAGNOSIS — K219 Gastro-esophageal reflux disease without esophagitis: Secondary | ICD-10-CM | POA: Diagnosis present

## 2011-06-27 DIAGNOSIS — K2100 Gastro-esophageal reflux disease with esophagitis, without bleeding: Secondary | ICD-10-CM | POA: Diagnosis present

## 2011-06-27 DIAGNOSIS — M6282 Rhabdomyolysis: Secondary | ICD-10-CM

## 2011-06-27 DIAGNOSIS — G4730 Sleep apnea, unspecified: Secondary | ICD-10-CM | POA: Diagnosis present

## 2011-06-27 DIAGNOSIS — E669 Obesity, unspecified: Secondary | ICD-10-CM | POA: Diagnosis present

## 2011-06-27 DIAGNOSIS — E1149 Type 2 diabetes mellitus with other diabetic neurological complication: Principal | ICD-10-CM

## 2011-06-27 DIAGNOSIS — K2270 Barrett's esophagus without dysplasia: Secondary | ICD-10-CM | POA: Diagnosis present

## 2011-06-27 DIAGNOSIS — I10 Essential (primary) hypertension: Secondary | ICD-10-CM | POA: Diagnosis present

## 2011-06-27 LAB — URINE MICROSCOPIC-ADD ON

## 2011-06-27 LAB — DIFFERENTIAL
Basophils Absolute: 0 10*3/uL (ref 0.0–0.1)
Basophils Relative: 0 % (ref 0–1)
Eosinophils Absolute: 0 10*3/uL (ref 0.0–0.7)
Eosinophils Relative: 0 % (ref 0–5)
Lymphocytes Relative: 3 % — ABNORMAL LOW (ref 12–46)
Lymphs Abs: 0.7 10*3/uL (ref 0.7–4.0)
Monocytes Absolute: 0.5 10*3/uL (ref 0.1–1.0)
Monocytes Relative: 2 % — ABNORMAL LOW (ref 3–12)
Neutro Abs: 23.7 10*3/uL — ABNORMAL HIGH (ref 1.7–7.7)
Neutrophils Relative %: 95 % — ABNORMAL HIGH (ref 43–77)

## 2011-06-27 LAB — URINALYSIS, ROUTINE W REFLEX MICROSCOPIC
Bilirubin Urine: NEGATIVE
Glucose, UA: NEGATIVE mg/dL
Ketones, ur: 15 mg/dL — AB
Leukocytes, UA: NEGATIVE
Nitrite: NEGATIVE
Protein, ur: 30 mg/dL — AB
Specific Gravity, Urine: 1.014 (ref 1.005–1.030)
Urobilinogen, UA: 1 mg/dL (ref 0.0–1.0)
pH: 6 (ref 5.0–8.0)

## 2011-06-27 LAB — COMPREHENSIVE METABOLIC PANEL
ALT: 29 U/L (ref 0–35)
AST: 50 U/L — ABNORMAL HIGH (ref 0–37)
Albumin: 3.4 g/dL — ABNORMAL LOW (ref 3.5–5.2)
Alkaline Phosphatase: 107 U/L (ref 39–117)
BUN: 33 mg/dL — ABNORMAL HIGH (ref 6–23)
CO2: 28 mEq/L (ref 19–32)
Calcium: 9.8 mg/dL (ref 8.4–10.5)
Chloride: 98 mEq/L (ref 96–112)
Creatinine, Ser: 1.44 mg/dL — ABNORMAL HIGH (ref 0.50–1.10)
GFR calc Af Amer: 45 mL/min — ABNORMAL LOW (ref >90)
GFR calc non Af Amer: 39 mL/min — ABNORMAL LOW (ref >90)
Glucose, Bld: 250 mg/dL — ABNORMAL HIGH (ref 70–99)
Potassium: 3.2 mEq/L — ABNORMAL LOW (ref 3.5–5.1)
Sodium: 141 mEq/L (ref 135–145)
Total Bilirubin: 0.8 mg/dL (ref 0.3–1.2)
Total Protein: 7.3 g/dL (ref 6.0–8.3)

## 2011-06-27 LAB — CBC
HCT: 38.6 % (ref 36.0–46.0)
Hemoglobin: 13.4 g/dL (ref 12.0–15.0)
MCH: 36.4 pg — ABNORMAL HIGH (ref 26.0–34.0)
MCHC: 34.7 g/dL (ref 30.0–36.0)
MCV: 104.9 fL — ABNORMAL HIGH (ref 78.0–100.0)
Platelets: 167 10*3/uL (ref 150–400)
RBC: 3.68 MIL/uL — ABNORMAL LOW (ref 3.87–5.11)
RDW: 15.5 % (ref 11.5–15.5)
WBC: 24.9 10*3/uL — ABNORMAL HIGH (ref 4.0–10.5)

## 2011-06-27 LAB — POCT I-STAT, CHEM 8
BUN: 35 mg/dL — ABNORMAL HIGH (ref 6–23)
Calcium, Ion: 1.13 mmol/L (ref 1.12–1.32)
Chloride: 101 mEq/L (ref 96–112)
Creatinine, Ser: 1.5 mg/dL — ABNORMAL HIGH (ref 0.50–1.10)
Glucose, Bld: 255 mg/dL — ABNORMAL HIGH (ref 70–99)
HCT: 39 % (ref 36.0–46.0)
Hemoglobin: 13.3 g/dL (ref 12.0–15.0)
Potassium: 3.2 mEq/L — ABNORMAL LOW (ref 3.5–5.1)
Sodium: 140 mEq/L (ref 135–145)
TCO2: 29 mmol/L (ref 0–100)

## 2011-06-27 LAB — CK TOTAL AND CKMB (NOT AT ARMC)
CK, MB: 7.9 ng/mL (ref 0.3–4.0)
Relative Index: 0.4 (ref 0.0–2.5)
Total CK: 2112 U/L — ABNORMAL HIGH (ref 7–177)

## 2011-06-27 LAB — LACTIC ACID, PLASMA: Lactic Acid, Venous: 1.6 mmol/L (ref 0.5–2.2)

## 2011-06-27 LAB — GLUCOSE, CAPILLARY: Glucose-Capillary: 213 mg/dL — ABNORMAL HIGH (ref 70–99)

## 2011-06-27 LAB — TROPONIN I: Troponin I: <0.3 ng/mL (ref <0.30)

## 2011-06-27 MED ORDER — ALBUTEROL SULFATE HFA 108 (90 BASE) MCG/ACT IN AERS
1.0000 | INHALATION_SPRAY | RESPIRATORY_TRACT | Status: DC
  Administered 2011-06-27: 1 via RESPIRATORY_TRACT
  Filled 2011-06-27: qty 6.7

## 2011-06-27 MED ORDER — ENOXAPARIN SODIUM 100 MG/ML ~~LOC~~ SOLN
1.0000 mg/kg | Freq: Two times a day (BID) | SUBCUTANEOUS | Status: DC
  Administered 2011-06-27 – 2011-06-28 (×2): 85 mg via SUBCUTANEOUS
  Filled 2011-06-27 (×4): qty 1

## 2011-06-27 MED ORDER — INSULIN ASPART 100 UNIT/ML ~~LOC~~ SOLN
4.0000 [IU] | Freq: Three times a day (TID) | SUBCUTANEOUS | Status: DC
  Administered 2011-06-28 – 2011-07-01 (×11): 4 [IU] via SUBCUTANEOUS

## 2011-06-27 MED ORDER — ONDANSETRON HCL 4 MG/2ML IJ SOLN: 4.0000 mg | Freq: Three times a day (TID) | INTRAMUSCULAR | Status: AC | PRN

## 2011-06-27 MED ORDER — FERROUS SULFATE 325 (65 FE) MG PO TABS
325.0000 mg | ORAL_TABLET | Freq: Three times a day (TID) | ORAL | Status: DC
  Administered 2011-06-27: 325 mg via ORAL
  Filled 2011-06-27 (×4): qty 1

## 2011-06-27 MED ORDER — FOLIC ACID 1 MG PO TABS
1.0000 mg | ORAL_TABLET | Freq: Every day | ORAL | Status: DC
  Administered 2011-06-27 – 2011-07-01 (×5): 1 mg via ORAL
  Filled 2011-06-27 (×5): qty 1

## 2011-06-27 MED ORDER — DOXEPIN HCL 100 MG PO CAPS
100.0000 mg | ORAL_CAPSULE | Freq: Every day | ORAL | Status: DC
  Administered 2011-06-27 – 2011-06-30 (×4): 100 mg via ORAL
  Filled 2011-06-27 (×5): qty 1

## 2011-06-27 MED ORDER — MORPHINE SULFATE 4 MG/ML IJ SOLN
4.0000 mg | INTRAMUSCULAR | Status: DC | PRN
  Administered 2011-06-29 (×2): 4 mg via INTRAVENOUS
  Filled 2011-06-27 (×2): qty 1

## 2011-06-27 MED ORDER — MORPHINE SULFATE 4 MG/ML IJ SOLN
4.0000 mg | Freq: Once | INTRAMUSCULAR | Status: AC
  Administered 2011-06-27: 4 mg via INTRAVENOUS
  Filled 2011-06-27: qty 1

## 2011-06-27 MED ORDER — PREDNISONE 5 MG PO TABS
5.0000 mg | ORAL_TABLET | Freq: Every day | ORAL | Status: DC
  Filled 2011-06-27: qty 1

## 2011-06-27 MED ORDER — CLONIDINE HCL 0.2 MG PO TABS
0.4000 mg | ORAL_TABLET | Freq: Every day | ORAL | Status: DC
  Administered 2011-06-27 – 2011-06-29 (×3): 0.4 mg via ORAL
  Filled 2011-06-27 (×4): qty 2

## 2011-06-27 MED ORDER — HYDROXYCHLOROQUINE SULFATE 200 MG PO TABS
200.0000 mg | ORAL_TABLET | Freq: Two times a day (BID) | ORAL | Status: DC
  Administered 2011-06-27 – 2011-07-01 (×8): 200 mg via ORAL
  Filled 2011-06-27 (×9): qty 1

## 2011-06-27 MED ORDER — ESCITALOPRAM OXALATE 10 MG PO TABS
10.0000 mg | ORAL_TABLET | Freq: Every day | ORAL | Status: DC
  Administered 2011-06-27 – 2011-07-01 (×5): 10 mg via ORAL
  Filled 2011-06-27 (×5): qty 1

## 2011-06-27 MED ORDER — FLUTICASONE-SALMETEROL 500-50 MCG/DOSE IN AEPB
1.0000 | INHALATION_SPRAY | Freq: Two times a day (BID) | RESPIRATORY_TRACT | Status: DC
  Administered 2011-06-27 – 2011-07-01 (×7): 1 via RESPIRATORY_TRACT
  Filled 2011-06-27 (×2): qty 14

## 2011-06-27 MED ORDER — VANCOMYCIN HCL 1000 MG IV SOLR
1500.0000 mg | INTRAVENOUS | Status: DC
Start: 2011-06-27 — End: 2011-06-29
  Administered 2011-06-28 (×2): 1500 mg via INTRAVENOUS
  Filled 2011-06-27 (×2): qty 1500

## 2011-06-27 MED ORDER — PANTOPRAZOLE SODIUM 40 MG PO TBEC
40.0000 mg | DELAYED_RELEASE_TABLET | Freq: Every day | ORAL | Status: DC
  Administered 2011-06-27 – 2011-07-01 (×5): 40 mg via ORAL
  Filled 2011-06-27 (×5): qty 1

## 2011-06-27 MED ORDER — INSULIN ASPART 100 UNIT/ML ~~LOC~~ SOLN
0.0000 [IU] | Freq: Three times a day (TID) | SUBCUTANEOUS | Status: DC
  Administered 2011-06-28: 11 [IU] via SUBCUTANEOUS
  Administered 2011-06-28: 15 [IU] via SUBCUTANEOUS
  Administered 2011-06-28: 11 [IU] via SUBCUTANEOUS
  Administered 2011-06-29: 15 [IU] via SUBCUTANEOUS
  Administered 2011-06-29 (×2): 8 [IU] via SUBCUTANEOUS
  Administered 2011-06-30: 5 [IU] via SUBCUTANEOUS
  Administered 2011-06-30: 3 [IU] via SUBCUTANEOUS
  Administered 2011-06-30: 5 [IU] via SUBCUTANEOUS
  Administered 2011-07-01: 8 [IU] via SUBCUTANEOUS
  Administered 2011-07-01: 2 [IU] via SUBCUTANEOUS
  Filled 2011-06-27: qty 3

## 2011-06-27 MED ORDER — PREDNISONE 50 MG PO TABS
60.0000 mg | ORAL_TABLET | ORAL | Status: AC
  Administered 2011-06-27: 60 mg via ORAL
  Filled 2011-06-27 (×2): qty 1

## 2011-06-27 MED ORDER — INSULIN ASPART 100 UNIT/ML ~~LOC~~ SOLN
0.0000 [IU] | Freq: Every day | SUBCUTANEOUS | Status: DC
  Administered 2011-06-27: 2 [IU] via SUBCUTANEOUS
  Administered 2011-06-28: 4 [IU] via SUBCUTANEOUS
  Administered 2011-06-29: 3 [IU] via SUBCUTANEOUS
  Administered 2011-06-30: 4 [IU] via SUBCUTANEOUS

## 2011-06-27 MED ORDER — METHOTREXATE 2.5 MG PO TABS
10.0000 mg | ORAL_TABLET | ORAL | Status: DC
  Administered 2011-06-27: 10 mg via ORAL
  Filled 2011-06-27: qty 1

## 2011-06-27 MED ORDER — SODIUM CHLORIDE 0.9 % IV SOLN
INTRAVENOUS | Status: AC
  Administered 2011-06-27: 100 mL/h via INTRAVENOUS

## 2011-06-27 MED ORDER — ALBUTEROL SULFATE HFA 108 (90 BASE) MCG/ACT IN AERS: 1.0000 | INHALATION_SPRAY | RESPIRATORY_TRACT | Status: DC | PRN

## 2011-06-27 MED ORDER — METFORMIN HCL 500 MG PO TABS: 500.0000 mg | ORAL_TABLET | Freq: Two times a day (BID) | ORAL | Status: DC

## 2011-06-27 MED ORDER — HYDROCODONE-ACETAMINOPHEN 5-325 MG PO TABS
1.0000 | ORAL_TABLET | Freq: Four times a day (QID) | ORAL | Status: DC | PRN
  Administered 2011-06-28 – 2011-07-01 (×6): 1 via ORAL
  Filled 2011-06-27 (×6): qty 1

## 2011-06-27 MED ORDER — ALLOPURINOL 300 MG PO TABS
300.0000 mg | ORAL_TABLET | Freq: Two times a day (BID) | ORAL | Status: DC
  Administered 2011-06-27 – 2011-07-01 (×8): 300 mg via ORAL
  Filled 2011-06-27 (×9): qty 1

## 2011-06-27 MED ORDER — PREDNISONE 50 MG PO TABS: 60.0000 mg | ORAL_TABLET | Freq: Every day | ORAL | Status: DC

## 2011-06-27 MED ORDER — LORATADINE 10 MG PO TABS
10.0000 mg | ORAL_TABLET | Freq: Every day | ORAL | Status: DC
  Administered 2011-06-27 – 2011-07-01 (×5): 10 mg via ORAL
  Filled 2011-06-27 (×5): qty 1

## 2011-06-27 MED ORDER — INSULIN GLARGINE 100 UNIT/ML ~~LOC~~ SOLN
30.0000 [IU] | Freq: Every day | SUBCUTANEOUS | Status: DC
  Administered 2011-06-27: 30 [IU] via SUBCUTANEOUS
  Filled 2011-06-27: qty 3

## 2011-06-27 MED ORDER — PREDNISONE 50 MG PO TABS
60.0000 mg | ORAL_TABLET | Freq: Every day | ORAL | Status: DC
  Administered 2011-06-28 – 2011-07-01 (×4): 60 mg via ORAL
  Filled 2011-06-27 (×5): qty 1

## 2011-06-27 MED ORDER — SIMVASTATIN 10 MG PO TABS
10.0000 mg | ORAL_TABLET | Freq: Every day | ORAL | Status: DC
  Administered 2011-06-27 – 2011-06-30 (×4): 10 mg via ORAL
  Filled 2011-06-27 (×5): qty 1

## 2011-06-27 MED ORDER — ALBUTEROL SULFATE (5 MG/ML) 0.5% IN NEBU: 2.5000 mg | INHALATION_SOLUTION | RESPIRATORY_TRACT | Status: DC | PRN

## 2011-06-27 MED ORDER — SODIUM CHLORIDE 0.9 % IV SOLN
Freq: Once | INTRAVENOUS | Status: AC
  Administered 2011-06-27: 125 mL/h via INTRAVENOUS

## 2011-06-27 MED ORDER — POTASSIUM CHLORIDE CRYS ER 20 MEQ PO TBCR: 80.0000 meq | EXTENDED_RELEASE_TABLET | Freq: Three times a day (TID) | ORAL | Status: DC

## 2011-06-27 MED ORDER — PREGABALIN 50 MG PO CAPS
100.0000 mg | ORAL_CAPSULE | Freq: Three times a day (TID) | ORAL | Status: DC
  Administered 2011-06-27 – 2011-07-01 (×11): 100 mg via ORAL
  Filled 2011-06-27: qty 1
  Filled 2011-06-27: qty 2
  Filled 2011-06-27: qty 1
  Filled 2011-06-27 (×6): qty 2
  Filled 2011-06-27: qty 1
  Filled 2011-06-27 (×2): qty 2

## 2011-06-27 MED ORDER — SODIUM CHLORIDE 0.9 % IV SOLN
INTRAVENOUS | Status: DC
  Administered 2011-06-27 – 2011-06-30 (×3): via INTRAVENOUS

## 2011-06-27 NOTE — ED Notes (Signed)
Pt eating dinner tray °

## 2011-06-27 NOTE — Progress Notes (Addendum)
ANTIBIOTIC CONSULT NOTE - INITIAL  Pharmacy Consult for Vancomycin & Zosyn Indication: cellulitis  Allergies  Allergen Reactions  . Benztropine Mesylate     REACTION: Alopecia and white spots on eyes  . Codeine     REACTION: Mouth Swelling - Tachycardia  . Diazepam     REACTION: COMA  . Diphenhydramine Hcl     REACTION: Tachycardia , anxiousness  . Lisinopril     REACTION: cough  . Penicillins     REACTION: ulcers in mouth, swelling, throat swelling  . Sulfonamide Derivatives     REACTION: rash - blisters  . Thioridazine Hcl Other (See Comments)    REACTION: swelling    Patient Measurements: Height: 5\' 1"  (154.9 cm) Weight: 192 lb (87.091 kg) IBW/kg (Calculated) : 47.8    Vital Signs: Temp: 99.1 F (37.3 C) (11/11 1900) Temp src: Oral (11/11 1900) BP: 161/83 mmHg (11/11 1900) Pulse Rate: 113  (11/11 1900)  Labs:  Basename 06/27/11 1125 06/27/11 1116 06/27/11 1113  WBC -- -- 24.9*  HGB 13.3 -- 13.4  PLT -- -- 167  LABCREA -- -- --  CREATININE 1.50* 1.44* --   Estimated Creatinine Clearance: 40.5 ml/min (by C-G formula based on Cr of 1.5).  Medical History: Past Medical History  Diagnosis Date  . Allergy   . Anxiety   . Depression   . Diabetes mellitus   . GERD (gastroesophageal reflux disease)   . Hyperlipidemia   . Hypertension   . Schizophrenia   . Polypharmacy   . Physical deconditioning   . Barrett esophagus     Barretts Esophagus - EGD 8/07. (Repeat 2-3 years)  . Nephrolithiasis     2008  . Asthma     PFT's (10/14/06)- mild obstructive airway dz, poor effort  . 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 CHF (congestive heart failure)     EF 55% on echo (5/10), repeat ECHO (08/29/09) EF 0000000, Grade 1 Diastolic Dsfxn, Lt atrium mildly dilated, PA systolic pressure mildly elevated.  . Obesity   . PE (pulmonary embolism)     2010  . Gout   . Hypokalemia   . Fibromyalgia   . Vitamin D  deficiency   . Steroid dependence   . Peripheral neuropathy   . Back pain   . Anemia     Assessment: Pt presents with cellulitis of left lower extremity & leukocytosis.  Start broad spectrum antibiotics due to diabetes.  Pt with PCN allergy causing swelling.  Have notified MD to change therapy or continue with vancomycin alone if no ulcer present. Adjust for renal function.  Goal of Therapy:  Vancomycin trough level 10-15 mcg/ml  Plan:  1) Hold Zosyn - f/u with MD. 2) Vancomycin 1500mg  IV q24h 3) F/U pt progress, renal function. 4) Check vancomycin trough level at steady state  [place], Lavonia Drafts 06/27/2011,9:51 PM   Spoke w/ Dorthey Sawyer MD Re: PCN allergy, okay to D/C Zosyn for allergy since being covered appropriately for now.  Wynona Neat, PharmD, BCPS 06/28/11 12:31 AM

## 2011-06-27 NOTE — ED Notes (Signed)
Pt arrived by ems, reports generalized weakness to her legs x 5 days, had fall last night and sat on kitchen floor till this am, falling frequently over past month. Has swelling and cellulitis to legs.

## 2011-06-27 NOTE — ED Provider Notes (Signed)
History     CSN: [id number] Arrival date & time: 06/27/2011  9:33 AM   First MD Initiated Contact with Patient 06/27/11 1010      Chief Complaint  Patient presents with  . Extremity Weakness    (Consider location/radiation/quality/duration/timing/severity/associated sxs/prior treatment) Patient is a 60 y.o. female presenting with fall. The history is provided by the patient.  Fall The accident occurred 12 to 24 hours ago. The fall occurred while walking. She landed on a hard floor. There was no blood loss. The point of impact was the left knee and right knee. The pain is present in the left knee, right knee and head. The pain is moderate. She was not ambulatory at the scene. There was no entrapment after the fall. There was no drug use involved in the accident. There was no alcohol use involved in the accident. Pertinent negatives include no visual change, no fever, no numbness, no abdominal pain, no bowel incontinence, no nausea, no vomiting, no hematuria, no headaches, no hearing loss, no loss of consciousness and no tingling.  Patient says that she had a fall in her kitchen last evening. She had a sensation as if her legs gave out, causing her to fall; she denies any syncope. She struck both knees as well as her head. She apparently was down on the floor for about 12 hours. She does not think she lost consciousness during this period, and she had no prodromal symptoms which caused her to fall. She states that she has become increasingly weak over about the past month and has felt weak in her legs for about the past 5 days. She denies any past medical history of back problems or current back pain. She denies numbness, tingling in her legs. She denies fevers at home. She states her pain is currently located mostly in her bilateral knees as well as her left lower extremity - she has had an ulcer to the left lower extremity for some time now and feels that the fall has irritated that.  Past  Medical History  Diagnosis Date  . Allergy   . Anxiety   . Depression   . Diabetes mellitus   . GERD (gastroesophageal reflux disease)   . Hyperlipidemia   . Hypertension   . Schizophrenia   . Polypharmacy   . Physical deconditioning   . Barrett esophagus     Barretts Esophagus - EGD 8/07. (Repeat 2-3 years)  . Nephrolithiasis     2008  . Asthma     PFT's (10/14/06)- mild obstructive airway dz, poor effort  . 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 CHF (congestive heart failure)     EF 55% on echo (5/10), repeat ECHO (08/29/09) EF 0000000, Grade 1 Diastolic Dsfxn, Lt atrium mildly dilated, PA systolic pressure mildly elevated.  . Obesity   . PE (pulmonary embolism)     2010  . Gout   . Hypokalemia   . Fibromyalgia   . Vitamin D deficiency   . Steroid dependence   . Peripheral neuropathy   . Back pain   . Anemia     Past Surgical History  Procedure Date  . Colonoscopy     benign polyps (12/2000), repeat colonoscopy performed in 2007  . Endometrial biopsy     10/03 benign columnar mucosa (limited material)    Family History  Problem Relation Age of Onset  . Bone cancer Mother   . COPD Father  History  Substance Use Topics  . Smoking status: Former Smoker    Quit date: 06/23/1972  . Smokeless tobacco: Never Used  . Alcohol Use: No    OB History    Grav Para Term Preterm Abortions TAB SAB Ect Mult Living                  Review of Systems  Constitutional: Positive for activity change. Negative for fever, chills, appetite change and fatigue.  HENT: Negative for facial swelling, neck pain, neck stiffness and tinnitus.   Eyes: Negative for photophobia, pain and visual disturbance.  Respiratory: Negative for chest tightness, shortness of breath, wheezing and stridor.   Cardiovascular: Negative for chest pain and palpitations.  Gastrointestinal: Negative for nausea, vomiting, abdominal pain and bowel  incontinence.  Genitourinary: Negative for hematuria.  Musculoskeletal: Positive for myalgias. Negative for back pain and joint swelling.  Skin: Negative for color change and rash.  Neurological: Negative for dizziness, tingling, loss of consciousness, syncope, numbness and headaches.  Psychiatric/Behavioral: Negative for confusion. The patient is not nervous/anxious.     Allergies  Benztropine mesylate; Codeine; Diazepam; Diphenhydramine hcl; Lisinopril; Penicillins; Sulfonamide derivatives; and Thioridazine hcl  Home Medications   Current Outpatient Rx  Name Route Sig Dispense Refill  . ALBUTEROL SULFATE (5 MG/ML) 0.5% IN NEBU Nebulization Take 2.5 mg by nebulization every 4 (four) hours as needed. For wheezing or tightness    . ALBUTEROL SULFATE HFA 108 (90 BASE) MCG/ACT IN AERS Inhalation Inhale 1 puff into the lungs every 4 (four) hours.     . ALENDRONATE SODIUM 70 MG PO TABS Oral Take 70 mg by mouth every 7 (seven) days. Take in the morning with a full glass of water, on an empty stomach, and do not take anything else by mouth or lie down for the next 30 min. Takes on Sunday.    . ALLOPURINOL 300 MG PO TABS Oral Take 300 mg by mouth 2 (two) times daily.     . BETHANECHOL CHLORIDE 5 MG PO TABS Oral Take 1 tablet (5 mg total) by mouth 2 (two) times daily. 60 tablet 6  . CETIRIZINE HCL 10 MG PO TABS Oral Take 1 tablet (10 mg total) by mouth daily. 30 tablet 11  . CLONIDINE HCL 0.2 MG PO TABS Oral Take 2 tablets (0.4 mg total) by mouth at bedtime. 60 tablet 1  . DOXEPIN HCL 100 MG PO CAPS Oral Take 100 mg by mouth at bedtime.      [name] Kitchen DOXEPIN HCL 25 MG PO CAPS Oral Take 1 capsule (25 mg total) by mouth at bedtime. 30 capsule 6  . ESCITALOPRAM OXALATE 10 MG PO TABS Oral Take 1 tablet (10 mg total) by mouth daily. 31 tablet 6  . ESOMEPRAZOLE MAGNESIUM 40 MG PO CPDR Oral Take 1 capsule (40 mg total) by mouth 2 (two) times daily. 60 capsule 11  . FERROUS SULFATE 325 (65 FE) MG PO TABS Oral Take  325 mg by mouth 3 (three) times daily.     [name] Kitchen FLUTICASONE PROPIONATE 50 MCG/ACT NA SUSP Nasal Place 2 sprays into the nose daily. Each nostril    . FLUTICASONE-SALMETEROL 500-50 MCG/DOSE IN AEPB Inhalation Inhale 1 puff into the lungs 2 (two) times daily. 60 each 6    Completed faxed request  . FOLIC ACID 1 MG PO TABS Oral Take 1 mg by mouth daily. Take while on methotrexate    . FUROSEMIDE 80 MG PO TABS Oral Take 1 tablet (80 mg  total) by mouth 2 (two) times daily. 60 tablet 6  . GLUCOSE BLOOD VI STRP  1 each. Check blood sugar 4 times daily    . HYDROCHLOROTHIAZIDE 25 MG PO TABS Oral Take 25 mg by mouth 2 (two) times daily.     [name] Kitchen HYDROCODONE-ACETAMINOPHEN 5-325 MG PO TABS Oral Take 1 tablet by mouth every 6 (six) hours as needed for pain. Take 1 tablet by mouth up to 4 times a day as needed for pain 30 tablet 2  . HYDROXYCHLOROQUINE SULFATE 200 MG PO TABS Oral Take 200 mg by mouth 2 (two) times daily.     . INSULIN ASPART 100 UNIT/ML [facility] SOLN Subcutaneous Inject 20-50 Units into the skin 3 (three) times daily before meals. 20 units before breakfast & 40 units with lunch & 50 with pm meal    . INSULIN GLARGINE 100 UNIT/ML Reeder SOLN Subcutaneous Inject 40 Units into the skin 3 (three) times daily. Give in am & in pm    . METFORMIN HCL 500 MG PO TABS Oral Take 500 mg by mouth 2 (two) times daily.     [name] Kitchen METHOTREXATE 2.5 MG PO TABS Oral Take 10 mg by mouth as directed. Per Dr. Estrella Deeds pills on Saturday & on sunday    . COMPRESSOR/NEBULIZER MISC  Per instructions    . OXYBUTYNIN CHLORIDE 5 MG PO TABS Oral Take 1 tablet (5 mg total) by mouth 2 (two) times daily. 180 tablet 3  . POTASSIUM CHLORIDE CRYS CR 20 MEQ PO TBCR Oral Take 80 mEq by mouth 3 (three) times daily.      [name] Kitchen PREDNISONE 5 MG PO TABS Oral Take 5 mg by mouth daily.     [name] Kitchen PREGABALIN 100 MG PO CAPS Oral Take 1 capsule (100 mg total) by mouth 3 (three) times daily. 90 capsule 3    Refilled on fax form  . RANITIDINE HCL 300 MG PO CAPS Oral Take  1 capsule (300 mg total) by mouth daily. 31 capsule 6  . SIMVASTATIN 10 MG PO TABS Oral Take 1 tablet (10 mg total) by mouth daily. 30 tablet 12  . SPIRONOLACTONE 100 MG PO TABS Oral Take 1 tablet (100 mg total) by mouth every morning. 30 tablet 1  . EPINEPHRINE 0.3 MG/0.3ML IJ DEVI  0.3 mg. Use as directed    . INSULIN SYRINGE-NEEDLE U-100 30G X 1/2" 1 ML MISC  Dispense QS for 4 times per day dosing.    [name] Kitchen NYSTATIN 100000 UNIT/GM EX POWD Topical Apply topically 2 (two) times daily. To groin & pannus prn      BP 128/64  Pulse 116  Temp(Src) 98.5 F (36.9 C) (Oral)  Resp 22  Ht 5\' 1"  (1.549 m)  Wt 192 lb (87.091 kg)  BMI 36.28 kg/m2  SpO2 98%  Physical Exam  Nursing note and vitals reviewed. Constitutional: She is oriented to person, place, and time. She appears well-developed and well-nourished. No distress.  HENT:  Head: Normocephalic and atraumatic.  Right Ear: External ear normal.  Left Ear: External ear normal.  Mouth/Throat: Oropharynx is clear and moist.  Eyes: Conjunctivae and EOM are normal. Pupils are equal, round, and reactive to light.  Neck: Normal range of motion. Neck supple. No tracheal deviation present.  Cardiovascular: Normal rate, regular rhythm and normal heart sounds.   Pulmonary/Chest: Effort normal and breath sounds normal. She exhibits no tenderness.  Abdominal: Soft. Bowel sounds are normal. There is no tenderness. There is no rebound.  Musculoskeletal: She exhibits  tenderness. She exhibits no edema.       Right knee: She exhibits no swelling, no effusion, no ecchymosis, no deformity and no erythema. no tenderness found.       Left knee: She exhibits no swelling, no effusion, no ecchymosis, no deformity and no erythema. no tenderness found.       Cervical back: Normal.       Thoracic back: Normal.       Lumbar back: Normal.       Spine: no palpable stepoff, crepitus, deformity. No TTP.  Knees: small abrasions noted to b/l knees, well healed TKA surgical  scars noted b/l. LEs neurovasc intact with sensory intact to lt touch. +DP pulses b/l.  Neurological: She is alert and oriented to person, place, and time. No cranial nerve deficit.  Skin: Skin is warm and dry. She is not diaphoretic.       Bruising noted to abdomen and R face. LLE: ulceration and erythema noted in gaiter region. Decreased leg hair in this area. Area is TTP. Several smaller ulcers noted to b/l feet and skin appears mildly macerated.  Psychiatric: She has a normal mood and affect.    ED Course  Procedures (including critical care time)  Labs Reviewed  URINALYSIS, ROUTINE W REFLEX MICROSCOPIC - Abnormal; Notable for the following:    Appearance CLOUDY (*)    Hgb urine dipstick LARGE (*)    Ketones, ur 15 (*)    Protein, ur 30 (*)    All other components within normal limits  CBC - Abnormal; Notable for the following:    WBC 24.9 (*)    RBC 3.68 (*)    MCV 104.9 (*)    MCH 36.4 (*)    All other components within normal limits  DIFFERENTIAL - Abnormal; Notable for the following:    Neutrophils Relative 95 (*)    Lymphocytes Relative 3 (*)    Monocytes Relative 2 (*)    Neutro Abs 23.7 (*)    All other components within normal limits  CK TOTAL AND CKMB - Abnormal; Notable for the following:    Total CK 2112 (*)    CK, MB 7.9 (*)    All other components within normal limits  COMPREHENSIVE METABOLIC PANEL - Abnormal; Notable for the following:    Potassium 3.2 (*)    Glucose, Bld 250 (*)    BUN 33 (*)    Creatinine, Ser 1.44 (*)    Albumin 3.4 (*)    AST 50 (*)    GFR calc non Af Amer 39 (*)    GFR calc Af Amer 45 (*)    All other components within normal limits  POCT I-STAT, CHEM 8 - Abnormal; Notable for the following:    Potassium 3.2 (*)    BUN 35 (*)    Creatinine, Ser 1.50 (*)    Glucose, Bld 255 (*)    All other components within normal limits  URINE MICROSCOPIC-ADD ON - Abnormal; Notable for the following:    Casts HYALINE CASTS (*)    All other  components within normal limits  LACTIC ACID, PLASMA  TROPONIN I  I-STAT TROPONIN I   Dg Chest 2 View  06/27/2011  *RADIOLOGY REPORT*  Clinical Data: Fall, leukocytosis  CHEST - 2 VIEW  Comparison: Plain film 10/14/2010  Findings:  Normal cardiac silhouette.  Mild central venous congestion.  No effusion, infiltrate, or pneumothorax.  IMPRESSION: Mild venous congestion.  Original Report Authenticated By: Suzy Bouchard, M.D.  Dg Knee 1-2 Views Left  06/27/2011  *RADIOLOGY REPORT*  Clinical Data: Total knee arthroplasty, fall  LEFT KNEE - 1-2 VIEW  Comparison: 09/28/2010  Findings: Left total knee arthroplasty with spacer.  No evidence of hardware complication.  No fracture or dislocation is seen.  The visualized soft tissues are unremarkable.  No suprapatellar knee joint effusion.  IMPRESSION: Left total knee arthroplasty without hardware complication.  No fracture or dislocation is seen.  Original Report Authenticated By: Julian Hy, M.D.   Dg Knee 1-2 Views Right  06/27/2011  *RADIOLOGY REPORT*  Clinical Data: Status post total knee arthroplasty, fall  RIGHT KNEE - 1-2 VIEW  Comparison: 09/28/2010  Findings: Right total knee arthroplasty with spacer.  No evidence of hardware complication or loosening.  No fracture or dislocation is seen.  The visualized soft tissues are unremarkable.  No suprasellar knee joint effusion.  IMPRESSION: Right total knee arthroplasty.  No evidence of hardware complication.  No fracture or dislocation is seen.  Original Report Authenticated By: Julian Hy, M.D.   Dg Tibia/fibula Left  06/27/2011  *RADIOLOGY REPORT*  Clinical Data: Fall, ulcers on the left tibia / fibula  LEFT TIBIA AND FIBULA - 2 VIEW  Comparison: None.  Findings: Left total knee arthroplasty.  No fracture or dislocation is seen.  Visualized soft tissues are grossly unremarkable.  IMPRESSION: No acute osseous abnormality is seen.  Original Report Authenticated By: Julian Hy, M.D.     Ct Head Wo Contrast  06/27/2011  *RADIOLOGY REPORT*  Clinical Data: Extremity weakness  CT HEAD WITHOUT CONTRAST  Technique:  Contiguous axial images were obtained from the base of the skull through the vertex without contrast.  Comparison: Head CT 09/28/2010  Findings: No acute intracranial hemorrhage.  No focal mass lesion. No CT evidence of acute infarction.   No midline shift or mass effect.  No hydrocephalus.  Basilar cisterns are patent. Paranasal sinuses and mastoid air cells are clear.  Orbits are normal.  IMPRESSION: No acute intracranial findings.  Original Report Authenticated By: Suzy Bouchard, M.D.     1. Weakness   2. Rhabdomyolysis      Date: 06/27/2011  Rate: 121  Rhythm: sinus tachycardia  QRS Axis: right  Intervals: normal  ST/T Wave abnormalities: normal  Conduction Disutrbances:right bundle branch block, LAFB  Narrative Interpretation:   Old EKG Reviewed: unchanged and as compared with 04/2011    MDM  Patient's lab work is remarkable for CK of 2000. MB is also elevated at 8, but index is normal. She does not have elevated troponin. Labs also remarkable for leukocytosis with WBC of 25k and 95% neutrophils. Urinalysis and CXR are unremarkable for evidence of infection. Will admit the patient for management of mild rhabdomyolysis and further eval for etiology of leukocytosis. Findings d/w pt. She agreed to be admitted for further management. She was given morphine while in the dept which has controlled her pain well and she is resting comfortably.  4:28 PM Talked with FP; patient has been admitted to their service. Temporary admission orders and bed request submitted.     Abran Richard, [place] 06/27/11 1630

## 2011-06-27 NOTE — ED Notes (Signed)
EKG shown to Dr. Vanita Panda, copies in the chart

## 2011-06-27 NOTE — Progress Notes (Signed)
ANTICOAGULATION CONSULT NOTE - Initial Consult  Pharmacy Consult for lovenox Indication: r/o PE + DVT  Allergies  Allergen Reactions  . Benztropine Mesylate     REACTION: Alopecia and white spots on eyes  . Codeine     REACTION: Mouth Swelling - Tachycardia  . Diazepam     REACTION: COMA  . Diphenhydramine Hcl     REACTION: Tachycardia , anxiousness  . Lisinopril     REACTION: cough  . Penicillins     REACTION: ulcers in mouth, swelling, throat swelling  . Sulfonamide Derivatives     REACTION: rash - blisters  . Thioridazine Hcl Other (See Comments)    REACTION: swelling    Patient Measurements: Height: 5\' 1"  (154.9 cm) Weight: 192 lb (87.091 kg) IBW/kg (Calculated) : 47.8   Vital Signs: Temp: 99.1 F (37.3 C) (11/11 1900) Temp src: Oral (11/11 1900) BP: 161/83 mmHg (11/11 1900) Pulse Rate: 113  (11/11 1900)  Labs:  Basename 06/27/11 1210 06/27/11 1125 06/27/11 1116 06/27/11 1113  HGB -- 13.3 -- 13.4  HCT -- 39.0 -- 38.6  PLT -- -- -- 167  APTT -- -- -- --  LABPROT -- -- -- --  INR -- -- -- --  HEPARINUNFRC -- -- -- --  CREATININE -- 1.50* 1.44* --  CKTOTAL -- -- -- 2112*  CKMB -- -- -- 7.9*  TROPONINI <0.30 -- -- --   Estimated Creatinine Clearance: 40.5 ml/min (by C-G formula based on Cr of 1.5).  Medical History: Past Medical History  Diagnosis Date  . Allergy   . Anxiety   . Depression   . Diabetes mellitus   . GERD (gastroesophageal reflux disease)   . Hyperlipidemia   . Hypertension   . Schizophrenia   . Polypharmacy   . Physical deconditioning   . Barrett esophagus     Barretts Esophagus - EGD 8/07. (Repeat 2-3 years)  . Nephrolithiasis     2008  . Asthma     PFT's (10/14/06)- mild obstructive airway dz, poor effort  . 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 CHF (congestive heart failure)     EF 55% on echo (5/10), repeat ECHO (08/29/09) EF 0000000, Grade 1 Diastolic Dsfxn, Lt  atrium mildly dilated, PA systolic pressure mildly elevated.  . Obesity   . PE (pulmonary embolism)     2010  . Gout   . Hypokalemia   . Fibromyalgia   . Vitamin D deficiency   . Steroid dependence   . Peripheral neuropathy   . Back pain   . Anemia     Medications:  Prescriptions prior to admission  Medication Sig Dispense Refill  . albuterol (PROVENTIL) (5 MG/ML) 0.5% nebulizer solution Take 2.5 mg by nebulization every 4 (four) hours as needed. For wheezing or tightness      . albuterol (VENTOLIN HFA) 108 (90 BASE) MCG/ACT inhaler Inhale 1 puff into the lungs every 4 (four) hours.       [name] Kitchen allopurinol (ZYLOPRIM) 300 MG tablet Take 300 mg by mouth 2 (two) times daily.       . cetirizine (ZYRTEC) 10 MG tablet Take 1 tablet (10 mg total) by mouth daily.  30 tablet  11  . cloNIDine (CATAPRES) 0.2 MG tablet Take 2 tablets (0.4 mg total) by mouth at bedtime.  60 tablet  1  . doxepin (SINEQUAN) 100 MG capsule Take 100 mg by mouth at bedtime.        [name] Kitchen  escitalopram (LEXAPRO) 10 MG tablet Take 1 tablet (10 mg total) by mouth daily.  31 tablet  6  . esomeprazole (NEXIUM) 40 MG capsule Take 1 capsule (40 mg total) by mouth 2 (two) times daily.  60 capsule  11  . ferrous sulfate 325 (65 FE) MG tablet Take 325 mg by mouth 3 (three) times daily.       . Fluticasone-Salmeterol (ADVAIR DISKUS) 500-50 MCG/DOSE AEPB Inhale 1 puff into the lungs 2 (two) times daily.  60 each  6  . folic acid (FOLVITE) 1 MG tablet Take 1 mg by mouth daily. Take while on methotrexate      . HYDROcodone-acetaminophen (NORCO) 5-325 MG per tablet Take 1 tablet by mouth every 6 (six) hours as needed for pain. Take 1 tablet by mouth up to 4 times a day as needed for pain  30 tablet  2  . hydroxychloroquine (PLAQUENIL) 200 MG tablet Take 200 mg by mouth 2 (two) times daily.       . metFORMIN (GLUCOPHAGE) 500 MG tablet Take 500 mg by mouth 2 (two) times daily.       . methotrexate 2.5 MG tablet Take 10 mg by mouth as directed.  Per Dr. Estrella Deeds pills on Saturday & on sunday      . potassium chloride SA (K-DUR,KLOR-CON) 20 MEQ tablet Take 80 mEq by mouth 3 (three) times daily.        . predniSONE (DELTASONE) 5 MG tablet Take 5 mg by mouth daily.       . pregabalin (LYRICA) 100 MG capsule Take 1 capsule (100 mg total) by mouth 3 (three) times daily.  90 capsule  3  . simvastatin (ZOCOR) 10 MG tablet Take 1 tablet (10 mg total) by mouth daily.  30 tablet  12  . DISCONTD: alendronate (FOSAMAX) 70 MG tablet Take 70 mg by mouth every 7 (seven) days. Take in the morning with a full glass of water, on an empty stomach, and do not take anything else by mouth or lie down for the next 30 min. Takes on Sunday.      [name] Kitchen DISCONTD: bethanechol (URECHOLINE) 5 MG tablet Take 1 tablet (5 mg total) by mouth 2 (two) times daily.  60 tablet  6  . DISCONTD: doxepin (SINEQUAN) 25 MG capsule Take 1 capsule (25 mg total) by mouth at bedtime.  30 capsule  6  . DISCONTD: fluticasone (FLONASE) 50 MCG/ACT nasal spray Place 2 sprays into the nose daily. Each nostril      . DISCONTD: furosemide (LASIX) 80 MG tablet Take 1 tablet (80 mg total) by mouth 2 (two) times daily.  60 tablet  6  . DISCONTD: glucose blood (ASCENSIA ELITE TEST STRIPS) test strip 1 each. Check blood sugar 4 times daily      . DISCONTD: hydrochlorothiazide (HYDRODIURIL) 25 MG tablet Take 25 mg by mouth 2 (two) times daily.       [name] Kitchen DISCONTD: insulin aspart (NOVOLOG) 100 UNIT/ML injection Inject 20-50 Units into the skin 3 (three) times daily before meals. 20 units before breakfast & 40 units with lunch & 50 with pm meal      . DISCONTD: insulin glargine (LANTUS) 100 UNIT/ML injection Inject 40 Units into the skin 3 (three) times daily. Give in am & in pm      . DISCONTD: Nebulizers (COMPRESSOR/NEBULIZER) MISC Per instructions      . DISCONTD: oxybutynin (DITROPAN) 5 MG tablet Take 1 tablet (5 mg total) by mouth 2 (two)  times daily.  180 tablet  3  . DISCONTD: ranitidine (ZANTAC) 300 MG  capsule Take 1 capsule (300 mg total) by mouth daily.  31 capsule  6  . DISCONTD: spironolactone (ALDACTONE) 100 MG tablet Take 1 tablet (100 mg total) by mouth every morning.  30 tablet  1  . DISCONTD: EPINEPHrine (EPIPEN) 0.3 mg/0.3 mL DEVI 0.3 mg. Use as directed      . DISCONTD: Insulin Syringe-Needle U-100 (B-D INS SYR ULTRAFINE 1CC/30G) 30G X 1/2" 1 ML MISC Dispense QS for 4 times per day dosing.      [name] Kitchen DISCONTD: nystatin (MYCOSTATIN) powder Apply topically 2 (two) times daily. To groin & pannus prn        Assessment: Pt admitted with weakness. Also with lower extremity swelling/redness. Noted with history of PE. Obtaining a VQ scan but will start full anticoagulation.   Goal of Therapy:  Anticoagulation for possible DVT/PE   Plan:  Lovenox 85mg  SQ Q12H CBC Q72H F/u VQ scan  Paz Winsett, Rande Lawman 06/27/2011,8:01 PM

## 2011-06-27 NOTE — H&P (Signed)
[facility] Admission History and Physical  Patient name: [name] Medical record number: [id number] Date of birth: [birth year] Age: 60 y.o. Gender: female  Primary Care Provider: Lynne Leader, MD  Chief Complaint: progressive bilateral lower extremity weakness x 1 week, increasing LLE pain and redness x 1 week  History of Present Illness: [name] is a 60 y.o. year old female presenting after falling on her way to the bathroom yesterday evening. After falling she could not get up from the floor and stayed there for about 9 hours until her niece returned from work. She states that she hit the top of her head on a recliner and landed on her knees on the floor. She denies loss of consciousness. She states that she has fallen approximately 5 times in the past week, and attributes the falls to weakness of her legs. She typically ambulates with a walker. She denies dizziness, syncope, vertigo, or incoordination. Pt reports generalized weakness for about 1 month. She denies back pain and lower extremity tingling. She does report some numbness in her legs that is not new. Pt reports pain of b/l knees as a result of the falls. She also reports one week of increasing pain and redness of her L lower leg. Pt denies fever, chills, headache, chest pain, palpitations, and shortness of breath. Pt reports vomiting four times on Friday evening, none since that time. Pt reports decreased appetite for the past week, denies abdominal pain and diarrhea, does admit to chronic constipation, last BM 4 days ago. Pt states that she feels anxious about her medical problems, but denies depressed mood.      Patient Active Problem List  Diagnoses  . DIABETES MELLITUS, TYPE I, UNCONTROLLED  . DIABETIC PERIPHERAL NEUROPATHY  . HYPERCHOLESTEROLEMIA  . Gout, unspecified  . HYPOKALEMIA  . OBESITY, NOS  . ANEMIA  . SCHIZOPHRENIA  . DEPRESSIVE DISORDER, NOS  . HYPERTENSION, BENIGN  SYSTEMIC  . PULMONARY EMBOLISM  . DIASTOLIC HEART FAILURE, CHRONIC  . VENOUS INSUFFICIENCY, CHRONIC  . RHINITIS, ALLERGIC  . ASTHMA, PERSISTENT  . REFLUX ESOPHAGITIS  . OSTEOARTHRITIS, MULTI SITES  . BACK PAIN WITH RADICULOPATHY  . SLEEP APNEA  . INCONTINENCE, URGE  . Polypharmacy  . Diabetic foot ulcer   Past Medical History: Past Medical History  Diagnosis Date  . Allergy   . Anxiety   . Depression   . Diabetes mellitus   . GERD (gastroesophageal reflux disease)   . Hyperlipidemia   . Hypertension   . Schizophrenia   . Polypharmacy   . Physical deconditioning   . Barrett esophagus     Barretts Esophagus - EGD 8/07. (Repeat 2-3 years)  . Nephrolithiasis     2008  . Asthma     PFT's (10/14/06)- mild obstructive airway dz, poor effort  . 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 CHF (congestive heart failure)     EF 55% on echo (5/10), repeat ECHO (08/29/09) EF 0000000, Grade 1 Diastolic Dsfxn, Lt atrium mildly dilated, PA systolic pressure mildly elevated.  . Obesity   . PE (pulmonary embolism)     2010  . Gout   . Hypokalemia   . Fibromyalgia   . Vitamin D deficiency   . Steroid dependence   . Peripheral neuropathy   . Back pain   . Anemia     Past Surgical History: Past Surgical History  Procedure Date  . Colonoscopy     benign  polyps (12/2000), repeat colonoscopy performed in 2007  . Endometrial biopsy     10/03 benign columnar mucosa (limited material)    Social History: History   Social History  . Marital Status: Single    Spouse Name: N/A    Number of Children: N/A  . Years of Education: N/A   Social History Main Topics  . Smoking status: Former Smoker    Quit date: 06/23/1972  . Smokeless tobacco: Never Used  . Alcohol Use: No  . Drug Use: No  . Sexually Active: No     Family History: Family History  Problem Relation Age of Onset  . Bone cancer Mother   . COPD Father      Allergies: Allergies  Allergen Reactions  . Benztropine Mesylate     REACTION: Alopecia and white spots on eyes  . Codeine     REACTION: Mouth Swelling - Tachycardia  . Diazepam     REACTION: COMA  . Diphenhydramine Hcl     REACTION: Tachycardia , anxiousness  . Lisinopril     REACTION: cough  . Penicillins     REACTION: ulcers in mouth, swelling, throat swelling  . Sulfonamide Derivatives     REACTION: rash - blisters  . Thioridazine Hcl Other (See Comments)    REACTION: swelling   No current facility-administered medications on file prior to encounter.   Current Outpatient Prescriptions on File Prior to Encounter  Medication Sig Dispense Refill  . albuterol (PROVENTIL) (5 MG/ML) 0.5% nebulizer solution Take 2.5 mg by nebulization every 4 (four) hours as needed. For wheezing or tightness      . albuterol (VENTOLIN HFA) 108 (90 BASE) MCG/ACT inhaler Inhale 1 puff into the lungs every 4 (four) hours.       [name] Kitchen allopurinol (ZYLOPRIM) 300 MG tablet Take 300 mg by mouth 2 (two) times daily.       . cetirizine (ZYRTEC) 10 MG tablet Take 1 tablet (10 mg total) by mouth daily.  30 tablet  11  . cloNIDine (CATAPRES) 0.2 MG tablet Take 2 tablets (0.4 mg total) by mouth at bedtime.  60 tablet  1  . doxepin (SINEQUAN) 100 MG capsule Take 100 mg by mouth at bedtime.        [name] Kitchen escitalopram (LEXAPRO) 10 MG tablet Take 1 tablet (10 mg total) by mouth daily.  31 tablet  6  . esomeprazole (NEXIUM) 40 MG capsule Take 1 capsule (40 mg total) by mouth 2 (two) times daily.  60 capsule  11  . ferrous sulfate 325 (65 FE) MG tablet Take 325 mg by mouth 3 (three) times daily.       . Fluticasone-Salmeterol (ADVAIR DISKUS) 500-50 MCG/DOSE AEPB Inhale 1 puff into the lungs 2 (two) times daily.  60 each  6  . folic acid (FOLVITE) 1 MG tablet Take 1 mg by mouth daily. Take while on methotrexate      . HYDROcodone-acetaminophen (NORCO) 5-325 MG per tablet Take 1 tablet by mouth every 6 (six) hours as needed  for pain. Take 1 tablet by mouth up to 4 times a day as needed for pain  30 tablet  2  . hydroxychloroquine (PLAQUENIL) 200 MG tablet Take 200 mg by mouth 2 (two) times daily.       . metFORMIN (GLUCOPHAGE) 500 MG tablet Take 500 mg by mouth 2 (two) times daily.       . methotrexate 2.5 MG tablet Take 10 mg by mouth as directed. Per Dr. Estrella Deeds pills  on Saturday & on sunday      . predniSONE (DELTASONE) 5 MG tablet Take 5 mg by mouth daily.       . pregabalin (LYRICA) 100 MG capsule Take 1 capsule (100 mg total) by mouth 3 (three) times daily.  90 capsule  3  . simvastatin (ZOCOR) 10 MG tablet Take 1 tablet (10 mg total) by mouth daily.  30 tablet  12     Review Of Systems: Per HPI.  Otherwise 12 point review of systems was performed and was unremarkable.  Physical Exam: Pulse: 116  Blood Pressure: 128/64 RR: 22   O2: 98% RA Temp: 98.5  General: Obese, alert, cooperative with exam.  HEENT: Normocephalic, atraumatic, PERRLA, extra ocular movement intact and sclera anicteric.  Neck: Supple, no JVD, no lymphadenopathy.  Heart: S1 and S2 normal, tachycardic, no murmurs/rubs/gallops.  Lungs: Decreased breath sounds, mild bibasilar expiratory wheezes.  Abdomen: Obese, bruising present, RUQ scar s/p cholecystectomy, reducible umbilical hernia, soft, mildly tender to palpation, no guarding, no rebound tenderness, bowel sounds present.  Extremities: Abrasions and bruising of knees bilaterally. R LE with skin discoloration due to venous stasis. L LE with significant calf and pretibial tenderness, warmth, erythema from ankle to knee, pretibial non-draining ulcer.  Skin: Warm and dry with ecchymoses over abdomen and b/l LE.  Neurology: Pt oriented to person and place, unsure of day/month/season.Cranial nerves 2-12 intact, DTRs 2/4 upper and lower extremities, sensation intact throughout. Strength 2-3/5 in bilateral LE.  Psych: Intermittently anxious and tearful during exam.   Labs and Imaging: Lab  Results  Component Value Date/Time   NA 140 06/27/2011 11:25 AM   K 3.2* 06/27/2011 11:25 AM   CL 101 06/27/2011 11:25 AM   CO2 28 06/27/2011 11:16 AM   BUN 35* 06/27/2011 11:25 AM   CREATININE 1.50* 06/27/2011 11:25 AM   GLUCOSE 255* 06/27/2011 11:25 AM   Lab Results  Component Value Date   WBC 24.9* 06/27/2011   HGB 13.3 06/27/2011   HCT 39.0 06/27/2011   MCV 104.9* 06/27/2011   PLT 167 06/27/2011    Other labs: CK 2112 CKMB 7.9 Lactic acid 1.6 Troponin (-) Urine: (+) for Hgb, ketones, protein. (-) for nitrites and leukocytes.   CXR with mild venous congestion.  CT head unremarkable.  L tib fib XR unremarkable.   Assessment and Plan: Domonic Balke is a 60 y.o. year old obese female with an extensive PMH (including PE in 2010 - on coumadin until 05/2011, CHF, HTN, HLD, asthma, GERD, gout, fibromyalgia, peripheral neuropathy, anxiety, depression, and schizophrenia) who lives at home with her niece, is independent with ADLs, and ambulates with a walker. She presents to the ED after a fall from standing. The patient reports multiple falls in the past week due to progressively worsening lower extremity weakness. After the most recent fall, she was unable to get up from the floor for 9 hours until her niece returned. She also reports increasing pain and redness of L LE over the past week.   1. Left leg pain: DVT/PE vs. Cellulitis   Left LE with calf and pretibial tenderness and erythema. History of PE in 2010 (on coumadin until 05/2011 - stopped due to fall risk). Pt without chest pain/shortness of breath. She is tachycardic (110-120). Pt afebrile but with leukocytosis 24.9 on admission and history of lower extremity ulcers.   - DVT/PE: Will order VQ scan (CT angio contraindicated due to elevated Cr 1.5) and LE venous doppler ultrasound. Will start full-dose lovenox.   -  Cellulitis: Will start Vancomycin for cellulitis in diabetic pt. Due to PCN allergy, cannot use Zosyn. Will  consider further need for antibiotic coverage tomorrow. Will order CBC for AM to trend white count.   - Monitor closely for  increasing pain and swelling of L LE, check vitals per protocol.   2.  LE weakness/Falls  Etiology of progressive LE weakness unknown at this time. Per pt, falls are not associated with dizziness or incoordination. DTRs and sensation intact. Pt able to bend knees, but cannot lift legs off bed (strength 2/5). Pt denies back pain, radiating leg pain, change in incontinence, fevers. No tenderness over spinous processes on exam.   - Will monitor pt closely overnight and continue neuro checks.   - Consider imaging of lumbar spine depending on symptomology.   - PT consulted.  3.  Rhabdomyolysis   Pt with recent fall and unable to get up for 9 hours. CK in elevated (2112). This could be the cause of Cr elevation to 1.5.   - Will hydrate pt (gently due to history of CHF) and repeat labs in AM.   4.  Insulin dependent DM  Pt taking metformin, novolog, and lantus at home. Pt reports compliance with medications. CBGs in ED 200-220.   - Will start SSI and Lantus. Hold metformin. Monitor CBGs per protocol. ADA diet.   5. HTN  Pt on clonidine, HCTZ, and spironolactone at home. Normotensive in ED.   - Will continue clonidine, hold HCTZ and spironolactone due to elevated Cr 1.5 (Cr 0.79 in 10/2010).   - Monitor BP per protocol.   6. CHF  EF 55-60% in 08/2009. Pt taking lasix 80mg  twice daily. No edema present on exam, pt appears mildly dehydrated.   - Will hold lasix and hydrate pt gently while monitoring clinically for signs of CHF.   7. Chronic steroid dependence  Pt currently on 5mg  prednisone daily.   - Will increase to 60mg  daily as inpatient to see if this helps improve weakness- in case of Adrenal insufficiency  8. Obstructive sleep apnea  Pt uses CPAP for sleep.   - Will order CPAP for use in hospital.    9. Rheumatoid arthritis  Per pt, diagnosed in 2011. Pt taking  hydroxychloroquine, methotrexate, and folic acid and prednisone 5mg  daily.   - Will continue home medications, increase prednisone dose as per above   10. Anxiety, depression, hx of schizophrenia  Pt intermittently anxious and tearful on examination but denies depressed mood. Pt states that she does not follow with psychiatry as outpatient.   - Will continue home medications: doxepin and escitalopram.    11. Fibromyalgia  Currently stable on Lyrica.   - Will continue Lyrica. .   12. GERD with Barrett's esophagus  Currently stable on Nexium and Zantac at home.   - Will continue PPI.   13. Hyperlipidemia  Currently stable on simvastatin.   - Will continue simvastatin.   14. History of gout  Currently without acute flare. Pt taking allopurinol for prophylaxis.   - Will continue allopurinol.   15. History of hypokalemia  Potassium today 3.2. Pt taking KCl at home.   - Will give dose of potassium due to mildly low level, and will recheck next BMet.   16. History of iron deficiency anemia  Pt takes iron supplementation as outpatient. Hgb today 13.4.   - Will discontinue iron supplementation due to pt complaint of constipation.   66. Asthma  Well controlled. Pt using Advair and albuterol  at home.   - Will continue Advair and albuterol.    18. FEN/GI  Pt with decreased appetite but tolerating diet. Will order ADA diet. Gentle hydration at 75/hr due to history of CHF.   19. Prophylaxis  DVT: Pt getting full dose lovenox.   GI: Pt on PPI.   18. Disposition  Pt in step down unit. Continue hospital level of care with close monitoring to work-up and address acute issues. Will need to be evaluated by PT/SW prior to discharge to assess safety of home situation.    Johny Blamer, MS-IV  Dorthey Sawyer, MD

## 2011-06-27 NOTE — ED Notes (Signed)
Admitting md at bedside

## 2011-06-27 NOTE — ED Notes (Signed)
Attempted to call report to 8 x2 with no success

## 2011-06-28 ENCOUNTER — Inpatient Hospital Stay (HOSPITAL_COMMUNITY): Payer: Medicaid Other

## 2011-06-28 ENCOUNTER — Other Ambulatory Visit: Payer: Self-pay

## 2011-06-28 DIAGNOSIS — M79609 Pain in unspecified limb: Secondary | ICD-10-CM

## 2011-06-28 LAB — BASIC METABOLIC PANEL
BUN: 24 mg/dL — ABNORMAL HIGH (ref 6–23)
CO2: 23 mEq/L (ref 19–32)
Calcium: 8.8 mg/dL (ref 8.4–10.5)
Chloride: 103 mEq/L (ref 96–112)
Creatinine, Ser: 1.08 mg/dL (ref 0.50–1.10)
GFR calc Af Amer: 64 mL/min — ABNORMAL LOW (ref >90)
GFR calc non Af Amer: 55 mL/min — ABNORMAL LOW (ref >90)
Glucose, Bld: 347 mg/dL — ABNORMAL HIGH (ref 70–99)
Potassium: 3.2 mEq/L — ABNORMAL LOW (ref 3.5–5.1)
Sodium: 137 mEq/L (ref 135–145)

## 2011-06-28 LAB — GLUCOSE, CAPILLARY
Glucose-Capillary: 324 mg/dL — ABNORMAL HIGH (ref 70–99)
Glucose-Capillary: 337 mg/dL — ABNORMAL HIGH (ref 70–99)
Glucose-Capillary: 487 mg/dL — ABNORMAL HIGH (ref 70–99)

## 2011-06-28 LAB — CBC
HCT: 33 % — ABNORMAL LOW (ref 36.0–46.0)
Hemoglobin: 11.4 g/dL — ABNORMAL LOW (ref 12.0–15.0)
MCH: 36 pg — ABNORMAL HIGH (ref 26.0–34.0)
MCHC: 34.5 g/dL (ref 30.0–36.0)
MCV: 104.1 fL — ABNORMAL HIGH (ref 78.0–100.0)
Platelets: 141 10*3/uL — ABNORMAL LOW (ref 150–400)
RBC: 3.17 MIL/uL — ABNORMAL LOW (ref 3.87–5.11)
RDW: 15.2 % (ref 11.5–15.5)
WBC: 15.9 10*3/uL — ABNORMAL HIGH (ref 4.0–10.5)

## 2011-06-28 LAB — MAGNESIUM: Magnesium: 2.2 mg/dL (ref 1.5–2.5)

## 2011-06-28 LAB — FOLATE: Folate: >20 ng/mL

## 2011-06-28 LAB — HEMOGLOBIN A1C
Hgb A1c MFr Bld: 7.6 % — ABNORMAL HIGH (ref <5.7)
Mean Plasma Glucose: 171 mg/dL — ABNORMAL HIGH (ref <117)

## 2011-06-28 LAB — DIFFERENTIAL
Basophils Absolute: 0 10*3/uL (ref 0.0–0.1)
Basophils Relative: 0 % (ref 0–1)
Eosinophils Absolute: 0 10*3/uL (ref 0.0–0.7)
Eosinophils Relative: 0 % (ref 0–5)
Lymphocytes Relative: 2 % — ABNORMAL LOW (ref 12–46)
Lymphs Abs: 0.3 10*3/uL — ABNORMAL LOW (ref 0.7–4.0)
Monocytes Absolute: 0.1 10*3/uL (ref 0.1–1.0)
Monocytes Relative: 0 % — ABNORMAL LOW (ref 3–12)
Neutro Abs: 15.5 10*3/uL — ABNORMAL HIGH (ref 1.7–7.7)
Neutrophils Relative %: 97 % — ABNORMAL HIGH (ref 43–77)

## 2011-06-28 LAB — VITAMIN B12: Vitamin B-12: 1181 pg/mL — ABNORMAL HIGH (ref 211–911)

## 2011-06-28 LAB — CK: Total CK: 744 U/L — ABNORMAL HIGH (ref 7–177)

## 2011-06-28 LAB — TSH: TSH: 1.024 u[IU]/mL (ref 0.350–4.500)

## 2011-06-28 MED ORDER — HEPARIN SODIUM (PORCINE) 5000 UNIT/ML IJ SOLN
5000.0000 [IU] | Freq: Three times a day (TID) | INTRAMUSCULAR | Status: DC
  Administered 2011-06-28 – 2011-07-01 (×9): 5000 [IU] via SUBCUTANEOUS
  Filled 2011-06-28 (×12): qty 1

## 2011-06-28 MED ORDER — XENON XE 133 GAS
10.0000 | GAS_FOR_INHALATION | Freq: Once | RESPIRATORY_TRACT | Status: AC | PRN
  Administered 2011-06-28: 10 via RESPIRATORY_TRACT

## 2011-06-28 MED ORDER — POTASSIUM CHLORIDE CRYS ER 20 MEQ PO TBCR
40.0000 meq | EXTENDED_RELEASE_TABLET | Freq: Once | ORAL | Status: AC
  Administered 2011-06-28: 40 meq via ORAL
  Filled 2011-06-28: qty 2

## 2011-06-28 MED ORDER — TECHNETIUM TO 99M ALBUMIN AGGREGATED
6.0000 | Freq: Once | INTRAVENOUS | Status: AC | PRN
  Administered 2011-06-28: 6 via INTRAVENOUS

## 2011-06-28 MED ORDER — CHLORHEXIDINE GLUCONATE 0.12 % MT SOLN
15.0000 mL | Freq: Two times a day (BID) | OROMUCOSAL | Status: DC
  Administered 2011-06-28 – 2011-07-01 (×8): 15 mL via OROMUCOSAL
  Filled 2011-06-28 (×10): qty 15

## 2011-06-28 MED ORDER — POLYETHYLENE GLYCOL 3350 17 G PO PACK
1.0000 | PACK | Freq: Every day | ORAL | Status: DC | PRN
  Filled 2011-06-28: qty 1

## 2011-06-28 MED ORDER — BIOTENE DRY MOUTH MT LIQD
15.0000 mL | Freq: Two times a day (BID) | OROMUCOSAL | Status: DC
  Administered 2011-06-28 – 2011-07-01 (×6): 15 mL via OROMUCOSAL

## 2011-06-28 MED ORDER — INSULIN GLARGINE 100 UNIT/ML ~~LOC~~ SOLN
40.0000 [IU] | Freq: Every day | SUBCUTANEOUS | Status: DC
  Administered 2011-06-28: 40 [IU] via SUBCUTANEOUS
  Filled 2011-06-28: qty 3

## 2011-06-28 MED ORDER — POTASSIUM CHLORIDE CRYS ER 20 MEQ PO TBCR
80.0000 meq | EXTENDED_RELEASE_TABLET | Freq: Two times a day (BID) | ORAL | Status: DC
  Administered 2011-06-28 – 2011-06-30 (×5): 80 meq via ORAL
  Filled 2011-06-28 (×7): qty 4

## 2011-06-28 NOTE — Progress Notes (Signed)
PT/OT/SLP Cancellation Note  Treatment cancelled today due to patient receiving procedure or test dopplers to r/o DVT  06/28/2011  Sueellen Kayes L. Dondrea Clendenin DPT [phone]

## 2011-06-28 NOTE — Progress Notes (Signed)
Clinical Social Worker will complete psychosocial assessment post PT evaluation to determine dc plans. Hunt Oris, MSW, [place]

## 2011-06-28 NOTE — ED Provider Notes (Signed)
Medical screening examination/treatment/procedure(s) were performed by non-physician practitioner and as supervising physician I was immediately available for consultation/collaboration.   Carmin Muskrat, MD 06/28/11 [phone]

## 2011-06-28 NOTE — Progress Notes (Signed)
Inpatient Diabetes Program Recommendations  AACE/ADA: New Consensus Statement on Inpatient Glycemic Control (2009)  Target Ranges:  Prepandial:   less than 140 mg/dL      Peak postprandial:   less than 180 mg/dL (1-2 hours)      Critically ill patients:  140 - 180 mg/dL   Reason for Visit: CBGs today: 324/ 337 mg/dl  Inpatient Diabetes Program Recommendations Insulin - Meal Coverage: Please increase meal coverage to Novolog 20 units tid with meals. HgbA1C: Decent glucose control at home- A1C was 7.6% (06/28/11)  Note: Home Novolog is as follows per pt:  20 units with breakfast/ 40 units with lunch/ 50 units with dinner

## 2011-06-28 NOTE — Progress Notes (Signed)
Pt with elevated CBG of 487 this evening on assessment. MD, Dr. Cipriano Mile notified. OK to admin insulin as per sliding scale of 15 units + 4 units of meal coverage per MD. To notify oncoming shift for re-assessment. Dorthey Sawyer

## 2011-06-28 NOTE — Progress Notes (Signed)
Physical Therapy Evaluation Patient Details Name: Taylan Gingerich MRN: [id number] DOB: 1951-04-02 Today's Date: 06/28/2011  Problem List:  Patient Active Problem List  Diagnoses  . DIABETES MELLITUS, TYPE I, UNCONTROLLED  . DIABETIC PERIPHERAL NEUROPATHY  . HYPERCHOLESTEROLEMIA  . Gout, unspecified  . HYPOKALEMIA  . OBESITY, NOS  . ANEMIA  . SCHIZOPHRENIA  . DEPRESSIVE DISORDER, NOS  . HYPERTENSION, BENIGN SYSTEMIC  . PULMONARY EMBOLISM  . DIASTOLIC HEART FAILURE, CHRONIC  . VENOUS INSUFFICIENCY, CHRONIC  . RHINITIS, ALLERGIC  . ASTHMA, PERSISTENT  . REFLUX ESOPHAGITIS  . OSTEOARTHRITIS, MULTI SITES  . BACK PAIN WITH RADICULOPATHY  . SLEEP APNEA  . INCONTINENCE, URGE  . Polypharmacy  . Diabetic foot ulcer    Past Medical History:  Past Medical History  Diagnosis Date  . Allergy   . Anxiety   . Depression   . Diabetes mellitus   . GERD (gastroesophageal reflux disease)   . Hyperlipidemia   . Hypertension   . Schizophrenia   . Polypharmacy   . Physical deconditioning   . Barrett esophagus     Barretts Esophagus - EGD 8/07. (Repeat 2-3 years)  . Nephrolithiasis     2008  . Asthma     PFT's (10/14/06)- mild obstructive airway dz, poor effort  . 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 CHF (congestive heart failure)     EF 55% on echo (5/10), repeat ECHO (08/29/09) EF 0000000, Grade 1 Diastolic Dsfxn, Lt atrium mildly dilated, PA systolic pressure mildly elevated.  . Obesity   . PE (pulmonary embolism)     2010  . Gout   . Hypokalemia   . Fibromyalgia   . Vitamin D deficiency   . Steroid dependence   . Peripheral neuropathy   . Back pain   . Anemia    Past Surgical History:  Past Surgical History  Procedure Date  . Colonoscopy     benign polyps (12/2000), repeat colonoscopy performed in 2007  . Endometrial biopsy     10/03 benign columnar mucosa (limited material)    PT  Assessment/Plan/Recommendation PT Assessment Clinical Impression Statement: Pt present with significant falls risk and would benefit from CIR consult (Pt has ulcers on planter surface of both feet. Very painful) PT Recommendation/Assessment: Patient will need skilled PT in the acute care venue PT Problem List: Decreased strength;Decreased range of motion;Decreased activity tolerance;Decreased mobility;Decreased coordination;Impaired sensation;Impaired tone;Decreased skin integrity;Pain Barriers to Discharge: Inaccessible home environment PT Therapy Diagnosis : Difficulty walking;Abnormality of gait;Generalized weakness;Acute pain PT Plan PT Frequency: Min 3X/week PT Treatment/Interventions: DME instruction;Gait training;Functional mobility training;Neuromuscular re-education;Patient/family education PT Recommendation Recommendations for Other Services: Rehab consult Follow Up Recommendations: Inpatient Rehab Equipment Recommended: Defer to next venue PT Goals  Acute Rehab PT Goals PT Goal Formulation: With patient Time For Goal Achievement: 2 weeks Pt will go Supine/Side to Sit: with modified independence PT Goal: Supine/Side to Sit - Progress: Other (comment) Pt will Transfer Sit to Stand/Stand to Sit: with modified independence PT Transfer Goal: Sit to Stand/Stand to Sit - Progress: Other (comment) Pt will Transfer Bed to Chair/Chair to Bed: with modified independence PT Transfer Goal: Bed to Chair/Chair to Bed - Progress: Other (comment) Pt will Ambulate: 51 - 150 feet PT Goal: Ambulate - Progress: Other (comment)  PT Evaluation Precautions/Restrictions  Precautions Precautions: Fall Restrictions Weight Bearing Restrictions: No Prior Functioning  Home Living Lives With: Other (Comment) (neice) Receives Help From: Other (Comment) (neice) Type of Home: Mobile  home Home Layout: One level Home Access: Stairs to enter Additional Comments: pt unsure of home set up secondary to  moving in a few days.   Prior Function Level of Independence: Independent with basic ADLs;Independent with gait;Independent with transfers;Requires assistive device for independence;Needs assistance with homemaking Able to Take Stairs?: Yes Driving: No Cognition Cognition Arousal/Alertness: Awake/alert Overall Cognitive Status: Appears within functional limits for tasks assessed Orientation Level: Oriented X4 Sensation/Coordination Sensation Light Touch: Impaired by gross assessment Extremity Assessment RLE Assessment RLE Assessment: Exceptions to [facility] RLE Strength RLE Overall Strength: Deficits;Due to pain;Due to premorbid status RLE Tone RLE Tone Comments: pt unable to flex bilateral knees when asked to.  Unsure if pt has motor planning problem or spasticity.   LLE Assessment LLE Assessment: Exceptions to [facility] Mobility (including Balance) Bed Mobility Bed Mobility: Yes Rolling Right: 6: Modified independent (Device/Increase time) Rolling Left: 6: Modified independent (Device/Increase time) Right Sidelying to Sit: 4: Min assist Right Sidelying to Sit Details (indicate cue type and reason): Pt pulling on bil hands assist for BIl LE secondary to pain. C/o pain in R flank increasing with activity. Transfers Transfers: Yes Sit to Stand: 4: Min assist Sit to Stand Details (indicate cue type and reason): assist for anterior wt shift for improved use of quads to extend knees.   Stand to Sit: 4: Min assist Stand to Sit Details: assist for controlled descent.   Ambulation/Gait Ambulation/Gait: Yes Ambulation/Gait Assistance: 1: +2 Total assist Ambulation/Gait Assistance Details (indicate cue type and reason): Assist at bil UE to stabilize pt, constant cues to address pt sliding left foot during Lt swing phase.   Ambulation Distance (Feet): 15 Feet Assistive device: 2 person hand held assist Gait Pattern: Decreased step length - right;Decreased step length - left;Shuffle Stairs: No      Exercise    End of Session PT - End of Session Equipment Utilized During Treatment: Gait belt Activity Tolerance: Patient limited by fatigue;Patient limited by pain Patient left: in chair;with call bell in reach Nurse Communication: Mobility status for transfers;Mobility status for ambulation General Behavior During Session: [facility] for tasks performed Cognition: [facility] for tasks performed  Kisa Fujii 06/28/2011, 5:27 PM

## 2011-06-28 NOTE — Progress Notes (Signed)
Subjective: Ms. Oxley states that she feels somewhat better this morning. She continues to have leg pain L>R and b/l LE weakness, but thinks these have improved. She reports sleeping well last night and has less anxiety today. Pt denies fever, chills, headache, dizziness, chest pain, palpitations, shortness of breath, abdominal pain, nausea, vomiting, and bleeding. Pt states that her niece Deberah Castle) is her "legal guardian", pt unsure of Theresa's contact information.   Objective: Vital signs in last 24 hours: Temp:  [97.9 F (36.6 C)-99.4 F (37.4 C)] 97.9 F (36.6 C) (11/12 0445) Pulse Rate:  [94-121] 96  (11/12 0445) Resp:  [18-27] 20  (11/12 0445) BP: (102-161)/(61-83) 102/61 mmHg (11/12 0445) SpO2:  [94 %-100 %] 95 % (11/12 0445) Weight:  [187 lb 9.8 oz (85.1 kg)-192 lb (87.091 kg)] 187 lb 9.8 oz (85.1 kg) (11/11 2130) Weight change:     Intake/Output from previous day: 11/11 0701 - 11/12 0700 In: 480 [P.O.:480] Out: 1250 [Urine:1250]   Physical Exam: General: Obese, alert, oriented to person and place, cooperative with exam.  Head: Normocephalic, atraumatic.  Neck: Obese, supple, no JVD, no lymphadenopathy.  Heart: RRR, S1S2, no murmurs/rubs/gallops.  Lungs: Mildly decreased breath sounds, clear to auscultation b/l.  Abdomen: Obese, bruising present, RUQ scar s/p cholecystectomy, reducible umbilical hernia, no significant tenderness to palpation, no guarding, no rebound, bowel sounds present.  Extremities: Abrasions and bruising of knees b/l. R LE with skin discoloration from chronic venous stasis. L LE with redness and tenderness of calf and pretibial area (noted to be less tender than yesterday's exam). Pretibial ulcer present. Various small foot ulcers.  Neuro: DTRs 2/4 upper and lower extremities, sensation intact, pt able to bend knees more than yesterday but cannot lift legs off bed.  Psych: Pt not tearful or anxious during exam.   Lab Results:  Basename 06/28/11  0650 06/27/11 1125 06/27/11 1113  WBC 15.9* -- 24.9*  HGB 11.4* 13.3 --  HCT 33.0* 39.0 --  PLT 141* -- 167   BMET  Basename 06/28/11 0650 06/27/11 1125 06/27/11 1116  NA 137 140 --  K 3.2* 3.2* --  CL 103 101 --  CO2 23 -- 28  GLUCOSE 347* 255* --  BUN 24* 35* --  CREATININE 1.08 1.50* --  CALCIUM 8.8 -- 9.8      Studies/Results: Dg Chest 2 View  06/27/2011  *RADIOLOGY REPORT*  Clinical Data: Fall, leukocytosis  CHEST - 2 VIEW  Comparison: Plain film 10/14/2010  Findings:  Normal cardiac silhouette.  Mild central venous congestion.  No effusion, infiltrate, or pneumothorax.  IMPRESSION: Mild venous congestion.  Original Report Authenticated By: Suzy Bouchard, M.D.   Dg Knee 1-2 Views Left  06/27/2011  *RADIOLOGY REPORT*  Clinical Data: Total knee arthroplasty, fall  LEFT KNEE - 1-2 VIEW  Comparison: 09/28/2010  Findings: Left total knee arthroplasty with spacer.  No evidence of hardware complication.  No fracture or dislocation is seen.  The visualized soft tissues are unremarkable.  No suprapatellar knee joint effusion.  IMPRESSION: Left total knee arthroplasty without hardware complication.  No fracture or dislocation is seen.  Original Report Authenticated By: Julian Hy, M.D.   Dg Knee 1-2 Views Right  06/27/2011  *RADIOLOGY REPORT*  Clinical Data: Status post total knee arthroplasty, fall  RIGHT KNEE - 1-2 VIEW  Comparison: 09/28/2010  Findings: Right total knee arthroplasty with spacer.  No evidence of hardware complication or loosening.  No fracture or dislocation is seen.  The visualized soft tissues are unremarkable.  No suprasellar knee joint effusion.  IMPRESSION: Right total knee arthroplasty.  No evidence of hardware complication.  No fracture or dislocation is seen.  Original Report Authenticated By: Julian Hy, M.D.   Dg Tibia/fibula Left  06/27/2011  *RADIOLOGY REPORT*  Clinical Data: Fall, ulcers on the left tibia / fibula  LEFT TIBIA AND FIBULA - 2  VIEW  Comparison: None.  Findings: Left total knee arthroplasty.  No fracture or dislocation is seen.  Visualized soft tissues are grossly unremarkable.  IMPRESSION: No acute osseous abnormality is seen.  Original Report Authenticated By: Julian Hy, M.D.   Ct Head Wo Contrast  06/27/2011  *RADIOLOGY REPORT*  Clinical Data: Extremity weakness  CT HEAD WITHOUT CONTRAST  Technique:  Contiguous axial images were obtained from the base of the skull through the vertex without contrast.  Comparison: Head CT 09/28/2010  Findings: No acute intracranial hemorrhage.  No focal mass lesion. No CT evidence of acute infarction.   No midline shift or mass effect.  No hydrocephalus.  Basilar cisterns are patent. Paranasal sinuses and mastoid air cells are clear.  Orbits are normal.  IMPRESSION: No acute intracranial findings.  Original Report Authenticated By: Suzy Bouchard, M.D.    Medications: I have reviewed the patient's current medications.  Assessment/Plan: Aishleen Rude is a 60 y.o. year old obese female with an extensive PMH (including PE in 2010 - on coumadin until 05/2011, CHF, HTN, HLD, asthma, GERD, gout, fibromyalgia, peripheral neuropathy, anxiety, depression, and schizophrenia) who lives at home with her niece, is independent with ADLs, and ambulates with a walker. She presents to the ED after a fall from standing. The patient reports multiple falls in the past week due to progressively worsening lower extremity weakness. After the most recent fall, she was unable to get up from the floor for 9 hours until her niece returned. She also reports increasing pain and redness of L LE over the past week.   1. Left leg pain: DVT/PE vs. Cellulitis  Left LE with calf and pretibial tenderness and erythema. Tenderness noted to be improved since yesterday's exam. Pt with history of PE in 2010 (on coumadin until 05/2011). Pt still without chest pain/shortness of breath. Tachycardia has improved, now with rate  in 90s. Pt remains afebrile with Tmax 99.4. Leukocytosis 24.9 on admission has improved to 11.4 today.  - DVT/PE: VQ scan (CT angio contraindicated due to elevated Cr 1.5) and LE venous doppler ultrasound are pending. Pt has been started on full-dose lovenox.  - Cellulitis: Vancomycin started last night. Due to PCN allergy, cannot add Zosyn for further coverage. WBC trending down, will repeat in AM and monitor pt clinically for signs of systemic infection.   2. LE weakness/Falls  Etiology of progressive LE weakness unknown. Per pt, falls are not associated with dizziness or incoordination. DTRs and sensation intact. Pt able to bend knees more today than yesterday, but cannot lift legs off bed (strength 2/5). Pt able to stand this morning to transfer from bed to commode with nursing help. Pt denies back pain, radiating leg pain, change in incontinence, fevers. No tenderness over spinous processes or paraspinal musculature on exam.  - Consider imaging of lumbar spine depending on symptomology. May be due to deconditioning and may be psychiatric component as well. - PT consulted to assess functional status. PT states that PT has helped in the past.   3. Rhabdomyolysis  Pt with recent fall and unable to get up for 9 hours. CK in elevated (2112) on admission.  Cr has improved this morning from 1.5 to 1.08.  -Will check CK now.  - Will continue gentle hydration (due to history of CHF).  4. Insulin dependent DM  Pt takes metformin, novolog, and 40 units lantus bid at home. Pt reports compliance with medications. CBGs in 300s this morning.  - Pt currently receiving moderate SSI with HS coverage and Lantus 30 units qHS. Will increase Lantus to 40 units qHS for now. Will continue to hold metformin. Monitor CBGs per protocol. ADA diet ordered. -Will follow-up HgbA1c (now pending).     5. HTN  Pt on clonidine, HCTZ, and spironolactone at home. Pt has been normotensive.  - Continue clonidine, hold HCTZ and  spironolactone due to elevated Cr 1.5 at admission.  - Monitor BP per protocol.   6. CHF  EF 55-60% in 08/2009. Pt taking lasix 80mg  twice daily. No edema present on exam, lungs clear to auscultation.  - Will hold lasix and hydrate pt gently while monitoring clinically for signs of CHF.   7. Chronic steroid dependence  Pt on 5mg  prednisone daily at home.  - Increased to 60mg  daily yesterday to see if this helps improve weakness in case of adrenal insufficiency   8. Obstructive sleep apnea  Pt uses CPAP for sleep at home. Pt used machine in hospital last night and reports sleeping well.  - Will continue CPAP.   9. Rheumatoid arthritis  Per pt, diagnosed in 2011. Pt takes hydroxychloroquine, methotrexate, folic acid, and prednisone 5mg  daily.  - Will continue home medications, increase prednisone dose as per above.  10. Anxiety, depression, hx of schizophrenia  Pt no longer visibly anxious and tearful. Pt states that she feels better today.  - Will continue home medications: doxepin and escitalopram.   11. Fibromyalgia  Currently stable on Lyrica.  - Will continue Lyrica. .   12. GERD with Barrett's esophagus  Currently stable on Nexium and Zantac at home.  - Will continue PPI.   13. Hyperlipidemia  Currently stable on simvastatin.  - Will continue simvastatin.   14. History of gout  Currently without acute flare. Pt taking allopurinol for prophylaxis.  - Will continue allopurinol.   15. History of hypokalemia  Potassium today 3.2. Pt taking 80 mEq KCl tid at home. Replaced 47mEq orally last night. Potassium unchanged this morning.  -Will check Mg now. -Will give 80 mEq bid.  -Will re-check BMET in the AM.   16. History of iron deficiency anemia  Pt takes iron supplementation as outpatient. Hgb today 13.4.  - Will discontinue iron supplementation due to pt complaint of constipation.   56. Asthma  Well controlled. Pt using Advair and albuterol at home.  - Will continue  Advair and albuterol.   18. FEN/GI  Pt with decreased appetite but tolerating ADA diet. Gentle hydration at 75/hr due to history of CHF.   19. Prophylaxis  DVT: Pt getting full dose lovenox.  GI: Pt on PPI.   18. Disposition  Continue hospital level of care with close monitoring to work-up and address acute issues. Will follow-up imaging studies today. Pt needs to be evaluated by PT/SW prior to discharge to assess safety of home situation.   19. Constipation -Will give Miralax and hold iron.    Johny Blamer, MS-IV

## 2011-06-28 NOTE — Progress Notes (Signed)
*  PRELIMINARY RESULTS* Bilateral lower extremity venous duplex completed. No evidence of DVT, superficial thrombosis, or Baker's cyst.  Birdena Crandall, [place] D 06/28/2011, 11:11 AM

## 2011-06-28 NOTE — Progress Notes (Signed)
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

## 2011-06-28 NOTE — H&P (Signed)
Seen and examined.  Discussed with housestaff.  Agree with management.  Briefly, patient has multiple medical problems and has been having frequent falls.  Yesterday fell and was down for ~10 hours before her cat, Tom, rescued her.  She has some Rt. Chest and Lt leg pain from the fall.    She has known severe spinal stenosis and her symptoms which cause her to fall are consistent with the neurologic claudication which is classic for that disease.  I doubt that she is a surgical candidate (the team may wish to investigate that possibility a bit more.)  With her macrocytosis, I would check B12 and folate to be sure that she doesn't have a vitamin deficiency peripheral neuropathy contributing to her symptoms.    I suspect that the best we will be able to do is to get her over this long lie syndrome, rehab her and then arrange dispo that provides the best safety given her ongoing fall risk.  At a minimum, she should have a life alert monitor to prevent any future episodes of being stranded when she falls.

## 2011-06-29 ENCOUNTER — Other Ambulatory Visit: Payer: Self-pay

## 2011-06-29 ENCOUNTER — Inpatient Hospital Stay (HOSPITAL_COMMUNITY): Payer: Medicaid Other

## 2011-06-29 LAB — BASIC METABOLIC PANEL
BUN: 25 mg/dL — ABNORMAL HIGH (ref 6–23)
CO2: 21 mEq/L (ref 19–32)
Calcium: 8.8 mg/dL (ref 8.4–10.5)
Chloride: 109 mEq/L (ref 96–112)
Creatinine, Ser: 0.93 mg/dL (ref 0.50–1.10)
GFR calc Af Amer: 76 mL/min — ABNORMAL LOW (ref >90)
GFR calc non Af Amer: 66 mL/min — ABNORMAL LOW (ref >90)
Glucose, Bld: 317 mg/dL — ABNORMAL HIGH (ref 70–99)
Potassium: 4.5 mEq/L (ref 3.5–5.1)
Sodium: 137 mEq/L (ref 135–145)

## 2011-06-29 LAB — CBC
HCT: 29.9 % — ABNORMAL LOW (ref 36.0–46.0)
Hemoglobin: 10.2 g/dL — ABNORMAL LOW (ref 12.0–15.0)
MCH: 35.3 pg — ABNORMAL HIGH (ref 26.0–34.0)
MCHC: 34.1 g/dL (ref 30.0–36.0)
MCV: 103.5 fL — ABNORMAL HIGH (ref 78.0–100.0)
Platelets: 140 10*3/uL — ABNORMAL LOW (ref 150–400)
RBC: 2.89 MIL/uL — ABNORMAL LOW (ref 3.87–5.11)
RDW: 14.9 % (ref 11.5–15.5)
WBC: 12.8 10*3/uL — ABNORMAL HIGH (ref 4.0–10.5)

## 2011-06-29 LAB — GLUCOSE, CAPILLARY
Glucose-Capillary: 329 mg/dL — ABNORMAL HIGH (ref 70–99)
Glucose-Capillary: 279 mg/dL — ABNORMAL HIGH (ref 70–99)
Glucose-Capillary: 271 mg/dL — ABNORMAL HIGH (ref 70–99)
Glucose-Capillary: 363 mg/dL — ABNORMAL HIGH (ref 70–99)
Glucose-Capillary: 299 mg/dL — ABNORMAL HIGH (ref 70–99)

## 2011-06-29 MED ORDER — CLINDAMYCIN HCL 300 MG PO CAPS
300.0000 mg | ORAL_CAPSULE | Freq: Four times a day (QID) | ORAL | Status: DC
  Administered 2011-06-29 – 2011-07-01 (×9): 300 mg via ORAL
  Filled 2011-06-29 (×11): qty 1

## 2011-06-29 MED ORDER — POLYETHYLENE GLYCOL 3350 17 G PO PACK
1.0000 | PACK | Freq: Every day | ORAL | Status: DC
  Administered 2011-06-29 – 2011-06-30 (×2): 17 g via ORAL
  Filled 2011-06-29 (×2): qty 1

## 2011-06-29 MED ORDER — LEVOFLOXACIN 750 MG PO TABS
750.0000 mg | ORAL_TABLET | Freq: Every day | ORAL | Status: DC
  Administered 2011-06-29 – 2011-06-30 (×2): 750 mg via ORAL
  Filled 2011-06-29 (×3): qty 1

## 2011-06-29 MED ORDER — ALBUTEROL SULFATE (5 MG/ML) 0.5% IN NEBU
2.5000 mg | INHALATION_SOLUTION | Freq: Once | RESPIRATORY_TRACT | Status: AC
  Administered 2011-06-29: 2.5 mg via RESPIRATORY_TRACT
  Filled 2011-06-29: qty 0.5

## 2011-06-29 MED ORDER — GLUCERNA SHAKE PO LIQD
237.0000 mL | Freq: Every day | ORAL | Status: DC
  Administered 2011-06-30 – 2011-07-01 (×2): 237 mL via ORAL

## 2011-06-29 MED ORDER — INSULIN GLARGINE 100 UNIT/ML ~~LOC~~ SOLN
40.0000 [IU] | Freq: Two times a day (BID) | SUBCUTANEOUS | Status: DC
  Administered 2011-06-30 – 2011-07-01 (×3): 40 [IU] via SUBCUTANEOUS

## 2011-06-29 MED ORDER — INSULIN GLARGINE 100 UNIT/ML ~~LOC~~ SOLN
40.0000 [IU] | Freq: Once | SUBCUTANEOUS | Status: AC
  Administered 2011-06-29: 40 [IU] via SUBCUTANEOUS

## 2011-06-29 MED ORDER — CIPROFLOXACIN HCL 750 MG PO TABS
750.0000 mg | ORAL_TABLET | Freq: Two times a day (BID) | ORAL | Status: DC
  Filled 2011-06-29 (×2): qty 1

## 2011-06-29 MED ORDER — LIDOCAINE 5 % EX PTCH
1.0000 | MEDICATED_PATCH | CUTANEOUS | Status: DC
  Administered 2011-06-29 – 2011-06-30 (×2): 1 via TRANSDERMAL
  Filled 2011-06-29 (×3): qty 1

## 2011-06-29 MED ORDER — VANCOMYCIN HCL IN DEXTROSE 1-5 GM/200ML-% IV SOLN
1000.0000 mg | Freq: Two times a day (BID) | INTRAVENOUS | Status: DC
  Administered 2011-06-29: 1000 mg via INTRAVENOUS
  Filled 2011-06-29 (×2): qty 200

## 2011-06-29 NOTE — Progress Notes (Signed)
ANTIBIOTIC CONSULT NOTE - FOLLOW UP  Pharmacy Consult for Vancomycin Indication: Cellulitis  Allergies  Allergen Reactions  . Benztropine Mesylate     REACTION: Alopecia and white spots on eyes  . Codeine     REACTION: Mouth Swelling - Tachycardia  . Diazepam     REACTION: COMA  . Diphenhydramine Hcl     REACTION: Tachycardia , anxiousness  . Lisinopril     REACTION: cough  . Penicillins     REACTION: ulcers in mouth, swelling, throat swelling  . Sulfonamide Derivatives     REACTION: rash - blisters  . Thioridazine Hcl Other (See Comments)    REACTION: swelling    Patient Measurements: Height: 5\' 1"  (154.9 cm) Weight: 187 lb 9.8 oz (85.1 kg) IBW/kg (Calculated) : 47.8    Vital Signs: Temp: 96.6 F (35.9 C) (11/13 0932) Temp src: Oral (11/13 0932) BP: 101/51 mmHg (11/13 0932) Pulse Rate: 81  (11/13 0932) Intake/Output from previous day: 11/12 0701 - 11/13 0700 In: [id number] [P.O.:290; I.V.:725] Out: -  Intake/Output from this shift:    Labs:  Basename 06/29/11 0500 06/28/11 0650 06/27/11 1125 06/27/11 1113  WBC 12.8* 15.9* -- 24.9*  HGB 10.2* 11.4* 13.3 --  PLT 140* 141* -- 167  LABCREA -- -- -- --  CREATININE 0.93 1.08 1.50* --   Estimated Creatinine Clearance: 64.5 ml/min (by C-G formula based on Cr of 0.93). No results found for this basename: VANCOTROUGH:2,VANCOPEAK:2,VANCORANDOM:2,GENTTROUGH:2,GENTPEAK:2,GENTRANDOM:2,TOBRATROUGH:2,TOBRAPEAK:2,TOBRARND:2,AMIKACINPEAK:2,AMIKACINTROU:2,AMIKACIN:2, in the last 72 hours   Microbiology: No results found for this or any previous visit (from the past 720 hour(s)).  Anti-infectives     Start     Dose/Rate Route Frequency Ordered Stop   06/27/11 2359   vancomycin (VANCOCIN) 1,500 mg in sodium chloride 0.9 % 500 mL IVPB        1,500 mg 250 mL/hr over 120 Minutes Intravenous Every 24 hours 06/27/11 2231     06/27/11 2200   hydroxychloroquine (PLAQUENIL) tablet 200 mg        200 mg Oral 2 times daily 06/27/11 1916             Assessment: 60 y.o. F on Vancomycin for cellulitis with improving renal fxn. SCr originally elevated on admission due to elevated CK/rhabdo.  SCr: 0.93, CrCl~65 ml/min. Will adjust Vancomycin based on current renal function.   Goal of Therapy:  Vancomycin trough level 10-15 mcg/ml  Plan: 1. Change Vancomycin to 1g IV q12 hours 2. Will continue to monitor renal function, culture results, and antibiotic de-escalation plans.  Alycia Rossetti Ann 06/29/2011,9:34 AM

## 2011-06-29 NOTE — Progress Notes (Signed)
Clinical Social Worker observed that PT has recommended CIR. Clinical Social Worker will continue reviewing chart in case there is a change from CIR to SNF. Hunt Oris, MSW, [place]

## 2011-06-29 NOTE — Progress Notes (Signed)
Subjective: [name] seen sitting comfortably in bed this morning. She complains of right sided chest pain that started overnight. She says the pain is worst with deep breaths and when she touches the area over her anterior R ribs. Otherwise pt states that her L LE pain has improved. She reports working with PT yesterday and states that her LE weakness is mostly unchanged. MRI from 04/2010 shows severe spinal stenosis at L3-4 which could be causing/contributing to her weakness. Spoke at length with pt's legal guardian [name] (neice) yesterday who indicated that she would like the pt to be seen by neurosurgery for a consult. L spine MRI results and NS consult pending.   Objective: Vital signs in last 24 hours: Temp:  [97.7 F (36.5 C)-98.9 F (37.2 C)] 97.7 F (36.5 C) (11/13 0526) Pulse Rate:  [75-99] 75  (11/13 0526) Resp:  [16-20] 18  (11/13 0526) BP: (97-119)/(63-69) 109/68 mmHg (11/13 0526) SpO2:  [90 %-98 %] 98 % (11/13 0745) Weight change:  Last BM Date: 06/26/11  Physical Exam: General: Obese, alert, oriented to person and place, cooperative with exam.  HEENT: Normocephalic, atraumatic, EOMI, moist mucous membranes.  Neck: Obese, supple, no JVD, no lymphadenopathy.  Chest: Tender to palpation of anterior L lower ribs. R side nontender.  Heart: RRR, S1S2, no murmurs/rubs/gallops.  Lungs: Pt unwilling to take deep breaths secondary to pain with poor air movement but clear to auscultation b/l. Occasional wheeze. Abdomen: Obese, bruising present, RUQ scar s/p cholecystectomy, reducible umbilical hernia, no tenderness to palpation, no guarding, no rebound, bowel sounds present.  Extremities: Improvement of abrasions and bruising of knees b/l. R LE with skin discoloration from chronic venous stasis. L LE with decreased redness and tenderness of calf and pretibial area. Pretibial ulcer present. Various small foot ulcers.  Neuro: DTRs 2/4 upper and lower extremities, sensation intact, pt  able to bend knees and lift legs off bed this morning.  Psych: Pt not tearful or anxious during exam.    Lab Results:  Basename 06/29/11 0500 06/28/11 0650  WBC 12.8* 15.9*  HGB 10.2* 11.4*  HCT 29.9* 33.0*  PLT 140* 141*   BMET  Basename 06/29/11 0500 06/28/11 0650  NA 137 137  K 4.5 3.2*  CL 109 103  CO2 21 23  GLUCOSE 317* 347*  BUN 25* 24*  CREATININE 0.93 1.08  CALCIUM 8.8 8.8    Studies/Results: Dg Chest 2 View  06/27/2011  *RADIOLOGY REPORT*  Clinical Data: Fall, leukocytosis  CHEST - 2 VIEW  Comparison: Plain film 10/14/2010  Findings:  Normal cardiac silhouette.  Mild central venous congestion.  No effusion, infiltrate, or pneumothorax.  IMPRESSION: Mild venous congestion.  Original Report Authenticated By: Suzy Bouchard, M.D.   Dg Knee 1-2 Views Left  06/27/2011  *RADIOLOGY REPORT*  Clinical Data: Total knee arthroplasty, fall  LEFT KNEE - 1-2 VIEW  Comparison: 09/28/2010  Findings: Left total knee arthroplasty with spacer.  No evidence of hardware complication.  No fracture or dislocation is seen.  The visualized soft tissues are unremarkable.  No suprapatellar knee joint effusion.  IMPRESSION: Left total knee arthroplasty without hardware complication.  No fracture or dislocation is seen.  Original Report Authenticated By: Julian Hy, M.D.   Dg Knee 1-2 Views Right  06/27/2011  *RADIOLOGY REPORT*  Clinical Data: Status post total knee arthroplasty, fall  RIGHT KNEE - 1-2 VIEW  Comparison: 09/28/2010  Findings: Right total knee arthroplasty with spacer.  No evidence of hardware complication or loosening.  No fracture  or dislocation is seen.  The visualized soft tissues are unremarkable.  No suprasellar knee joint effusion.  IMPRESSION: Right total knee arthroplasty.  No evidence of hardware complication.  No fracture or dislocation is seen.  Original Report Authenticated By: Julian Hy, M.D.   Dg Tibia/fibula Left  06/27/2011  *RADIOLOGY REPORT*   Clinical Data: Fall, ulcers on the left tibia / fibula  LEFT TIBIA AND FIBULA - 2 VIEW  Comparison: None.  Findings: Left total knee arthroplasty.  No fracture or dislocation is seen.  Visualized soft tissues are grossly unremarkable.  IMPRESSION: No acute osseous abnormality is seen.  Original Report Authenticated By: Julian Hy, M.D.   Ct Head Wo Contrast  06/27/2011  *RADIOLOGY REPORT*  Clinical Data: Extremity weakness  CT HEAD WITHOUT CONTRAST  Technique:  Contiguous axial images were obtained from the base of the skull through the vertex without contrast.  Comparison: Head CT 09/28/2010  Findings: No acute intracranial hemorrhage.  No focal mass lesion. No CT evidence of acute infarction.   No midline shift or mass effect.  No hydrocephalus.  Basilar cisterns are patent. Paranasal sinuses and mastoid air cells are clear.  Orbits are normal.  IMPRESSION: No acute intracranial findings.  Original Report Authenticated By: Suzy Bouchard, M.D.   Nm Pulmonary Per & Vent  06/28/2011  *RADIOLOGY REPORT*  Clinical Data:  Rule out pulmonary embolus.  History of prior pulmonary embolus.  Shortness of breath.  NUCLEAR MEDICINE VENTILATION - PERFUSION LUNG SCAN  Technique:  Wash-in, equilibrium, and wash-out phase ventilation images were obtained using Xe-133 gas.  Perfusion images were obtained in multiple projections after intravenous injection of Tc- 27m MAA.  Radiopharmaceuticals:  10 mCi Xe-133 gas and 6.0 mCi Tc-65m MAA.  Comparison:  Chest radiograph from 06/27/2011  Findings: On the perfusion portion of the examination there is a normal distribution of the radiopharmaceutical to both lungs.  No segmental perfusion defects identified.  On the ventilation portion of the examination there is no abnormal areas of xenon tracer retention.  IMPRESSION:  1.  Very low probability for acute pulmonary embolus.  Original Report Authenticated By: Angelita Ingles, M.D.    Medications: I have reviewed the  patient's current medications.  Assessment/Plan: [name] is a 60 y.o. year old obese female with an extensive PMH who lives at home with her niece/legal guardian [name]. She presented with increasing lower extremity weakness and falls as well as L LE pain and redness. She is being treated for L LE cellulitis and is being assessed by PT and neurosurgery to address the weakness.   1. Left leg pain: cellulitis  DVT/PE ruled out with negative LE doppler ultrasound and negative VQ scan. Full dose lovenox stopped. Erythema, warmth, and tenderness improved on today's exam. Pt remains afebrile and WBC continues to decline (15.9 yesterday to 12.8 today).  -Will change IV vancomycin to oral clindamyin and ciprofloxacin today.  -Will continue to monitor pt closely by checking vitals and performing physical exam of L LE.   2. LE weakness/Falls  Etiology may be due to severe L3-4 spinal stenosis seen lumbar MRI in 04/2010 versus deconditioning. Also, per the pt's legal guardian, there also may be a psych component due to history of similar episodes during times of stress. Pt's strength and mobility improved today on exam - she is able to lift her legs off the bed, especially when distracted, and bend her knees. PT worked with pt yesterday and recommend inpatient rehabilitation. Pt continues to denies back  pain and tenderness.  - Lumbar MRI results pending, pt to be seen by neurosurgery.  - Pt to continue working with PT, which has helped in the past.   - Social work will try to arrange inpatient rehabilitation.  3. Chest pain Pt complains of L sided chest pain this morning with deep breathing. Area is tender to palpation. CXR on 11/11 showed no infiltrate. Vital signs stable and WNL. Etiology likely costochondritis.  - Will order lidoderm patch.  -Will order repeat EKG to rule out cardiac cause.  - If no improvement, could consider repeat CXR.   4. Elevated CK Pt with recent fall and unable to get up  for 9 hours. CK in elevated (2112) on admission - has now decreased to 744. Cr has from 1.5 on admission to 0.93 today.  - Will continue gentle hydration (due to history of CHF).   5. Insulin dependent DM  Pt takes metformin, novolog, and 40 units lantus bid at home. Pt reports compliance with medications. A1C 7.6. CBGs elevated overnight to 487, this morning 200-300. Received 65 units of Novolog yesterday. -Will increase Lantus to 40 units bid.  -Will continue SSI. -Continue to hold metformin.  -Monitor CBGs per protocol. Continue ADA diet - pt eating well.   6. HTN  Pt on clonidine, HCTZ, and spironolactone at home. Pt has been normotensive.  - Continue clonidine, hold HCTZ and spironolactone due to elevated Cr 1.5 at admission.  - Patient not on ARB (lisinopril causes cough) for renal protection. Will discuss with team about starting or start as outpatient since patient has decent follow-up with one of our residents.  - Monitor BP per protocol.   7. CHF  EF 55-60% in 08/2009. Pt taking lasix 80mg  twice daily. No edema present on exam, lungs clear to auscultation.  - Will hold lasix and spironolactone and hydrate pt gently while monitoring clinically for signs of CHF.   8. Chronic steroid dependence  Pt on 5mg  prednisone daily at home.  - Increased to 60mg  daily yesterday to see if this helps improve weakness in case of adrenal insufficiency   9. Obstructive sleep apnea  Pt uses CPAP for sleep at home. Pt using machine while inpatient and reports sleeping well.  - Will continue CPAP.   10. Rheumatoid arthritis  Per pt, diagnosed in 2011. Pt takes hydroxychloroquine, methotrexate, folic acid, and prednisone 5mg  daily. Currently stable.  - Will continue home medications, increase prednisone dose as per above.   11. Anxiety, depression, hx of schizophrenia  Pt no longer visibly anxious and tearful. - Will continue home medications: doxepin and escitalopram.   12. Fibromyalgia    Currently stable on Lyrica.  - Will continue Lyrica. [name] Kitchen   13. GERD with Barrett's esophagus  Currently stable on Nexium and Zantac at home.  - Will continue PPI.   14. Hyperlipidemia  Simvastatin discontinued due to elevated CK per pharmacy recommendation - Will plan to continue simvastatin upon discahrge.   15. History of gout  Currently without acute flare. Pt taking allopurinol for prophylaxis.  - Will continue allopurinol.   16. History of hypokalemia  Potassium improved from 3.2 yesterday to 4.5 today. Pt takes 80 mEq KCl tid at home. Currently receiving 62meq bid.  - Will continue current dosing.   17. History of iron deficiency anemia  Pt takes iron supplementation as outpatient. Hgb today 10.2.   - Iron supplementation has been held due to pt complaint of constipation.   18. Constipation No bowel  movement. Did not receive Miralax. - Iron held. Will schedule Miralax.  19. Asthma  Pt using Advair and albuterol at home. Sounds more wheezy today.  -Will give albuterol nebs bid today.  - Will continue Advair and albuterol.   20. FEN/GI  Pt with improved appetite. On ADA diet. Gentle hydration at 75/hr due to history of CHF.   21. Prophylaxis  DVT: Heparin 5000 tid subq.  GI: Pt on PPI.   22. Disposition  Continue hospital level of care.  -MRI and neurosurgery pending to address spinal stenosis and LE weakness.  -PT has recommended inpatient therapy. Social work will address placement.   Johny Blamer, MS-IV       LOS: 2 days   [place] PARK, Mikaele Stecher 06/29/2011, 9:26 AM

## 2011-06-29 NOTE — Progress Notes (Signed)
I interviewed and examined this patient and discussed the care plan with Dr. Verdie Drown and the [facility] team and agree with assessment and plan as documented in the progress note for today.Patrick Jupiter A. Walker Kehr, [place] [facility] Attending  06/29/2011 8:12 PM

## 2011-06-29 NOTE — Progress Notes (Signed)
INITIAL ADULT NUTRITION ASSESSMENT Date: 06/29/2011   Time: 3:35 PM  Reason for Assessment: Nutrition Risk Report (unintentional wt loss)  ASSESSMENT: Female 60 years old  Dx: progressive bilateral extremity weakness x 1 week, left leg pain   Hx:  Past Medical History  Diagnosis Date  . Allergy   . Anxiety   . Depression   . Diabetes mellitus   . GERD (gastroesophageal reflux disease)   . Hyperlipidemia   . Hypertension   . Schizophrenia   . Polypharmacy   . Physical deconditioning   . Barrett esophagus     Barretts Esophagus - EGD 8/07. (Repeat 2-3 years)  . Nephrolithiasis     2008  . Asthma     PFT's (10/14/06)- mild obstructive airway dz, poor effort  . 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 CHF (congestive heart failure)     EF 55% on echo (5/10), repeat ECHO (08/29/09) EF 0000000, Grade 1 Diastolic Dsfxn, Lt atrium mildly dilated, PA systolic pressure mildly elevated.  . Obesity   . PE (pulmonary embolism)     2010  . Gout   . Hypokalemia   . Fibromyalgia   . Vitamin D deficiency   . Steroid dependence   . Peripheral neuropathy   . Back pain   . Anemia    Related Meds:     . albuterol  2.5 mg Nebulization Once  . allopurinol  300 mg Oral BID  . antiseptic oral rinse  15 mL Mouth Rinse q12n4p  . chlorhexidine  15 mL Mouth Rinse BID  . clindamycin  300 mg Oral QID  . cloNIDine  0.4 mg Oral QHS  . doxepin  100 mg Oral QHS  . escitalopram  10 mg Oral Daily  . Fluticasone-Salmeterol  1 puff Inhalation BID  . folic acid  1 mg Oral Daily  . heparin subcutaneous  5,000 Units Subcutaneous Q8H  . hydroxychloroquine  200 mg Oral BID  . insulin aspart  0-15 Units Subcutaneous TID WC  . insulin aspart  0-5 Units Subcutaneous QHS  . insulin aspart  4 Units Subcutaneous TID WC  . insulin glargine  40 Units Subcutaneous BID  . insulin glargine  40 Units Subcutaneous Once  . levofloxacin  750 mg Oral Q1500    . lidocaine  1 patch Transdermal Q24H  . loratadine  10 mg Oral Daily  . methotrexate  10 mg Oral Custom  . pantoprazole  40 mg Oral Daily  . polyethylene glycol  1 packet Oral Daily  . potassium chloride  80 mEq Oral BID  . predniSONE  60 mg Oral QAC breakfast  . pregabalin  100 mg Oral TID  . simvastatin  10 mg Oral q1800  . vancomycin  1,000 mg Intravenous Q12H  . DISCONTD: ciprofloxacin  750 mg Oral BID  . DISCONTD: insulin glargine  40 Units Subcutaneous QHS  . DISCONTD: vancomycin  1,500 mg Intravenous Q24H   Ht: 5\' 1"  (154.9 cm)  Wt: 187 lb 9.8 oz (85.1 kg)  Ideal Wt: 47.7kg  % Ideal Wt: 178%  Usual Wt: 245# (111 kg) per pt  % Usual Wt: 77%  Body mass index is 35.45 kg/(m^2). Pt meets criteria for obesity unspecified.  Food/Nutrition Related Hx: niece prepares foods for pt PTA   Labs:  Sodium 137 mEq/L      Potassium 4.5 mEq/L      Chloride 109 mEq/L      CO2 21  mEq/L      Glucose, Bld 317 mg/dL H     BUN 25 mg/dL H     Creatinine, Ser 0.93 mg/dL      Calcium 8.8 mg/dL    CBG's: 271 - 279 - 329  I/O last 3 completed shifts: In: [id number] [P.O.:770; I.V.:725] Out: 1250 [Urine:1250] Total I/O In: 600 [P.O.:600] Out: 450 [Urine:450]  Diet Order: Heart Healthy diet  Supplements/Tube Feeding: none  IVF:    sodium chloride Last Rate: 75 mL/hr at 06/29/11 0531   Estimated Nutritional Needs:   Kcal: 1800 - 2000 Protein: 70 - 85g Fluid: 1.8 - 2.0 L/d  Patient reports appetite is improving, noted poor intake x 1 week PTA. Reports ~50# wt loss x 2 years 2/2 "diabetes". Current weight 85.1kg. Wt change x 2 years is 23%. Reports that niece does grocery shopping and prepares nutritious meals for patient regularly. Agreeable to Glucerna supplement. Noted several diabetic ulcers on lower extremities per skin assessment.  NUTRITION DIAGNOSIS: -Inadequate protein energy intake (NI-5.3).  Status: Ongoing  RELATED TO: recent poor PO intake  AS EVIDENCE BY: patient's  report.  MONITORING/EVALUATION(Goals): Goal: Pt to complete >75% of meals and supplements. Monitor: Weights, labs, PO intake  EDUCATION NEEDS: -Education not appropriate at this time  INTERVENTION: 1. Glucerna Shake daily to provide additional kcal and protein needs 2. Encourage meals as able, offer appropriate alternatives if patient refuses meals.  Dietitian [id number]  [place] Per approved criteria  -Obesity Unspecified    Asencion Partridge 06/29/2011, 3:35 PM

## 2011-06-29 NOTE — Progress Notes (Signed)
Inpatient Diabetes Program Recommendations  AACE/ADA: New Consensus Statement on Inpatient Glycemic Control (2009)  Target Ranges:  Prepandial:   less than 140 mg/dL      Peak postprandial:   less than 180 mg/dL (1-2 hours)      Critically ill patients:  140 - 180 mg/dL   Reason for Visit: CBGs 11/12: 324/ 337/ 487/ 329 mg/dl  Inpatient Diabetes Program Recommendations Insulin - Basal: Titrate Lantus prn.  Home Lantus dose 40 units bid- Current Lantus dose is 40 units QHS. Insulin - Meal Coverage: Please increase meal coverage to Novolog 20 units tid with meals. HgbA1C: Decent glucose control at home- A1C was 7.6% (06/28/11)  Note: Pt on PO Prednisone.  Elevated Fasting & Postprandial CBGs. Takes large doses of insulin at home per records. Home DM Meds per records include: Lantus 40 units bid Novolog 20 units breakfast/40 units lunch/ 50 units dinner Metformin 500 mg bid

## 2011-06-29 NOTE — Consult Note (Signed)
Patient is a 60 year old female with multiple medical problems including poorly-controlled diabetes with bilateral diabetic foot ulcers. She has lower extremity venous stasis with a secondary ulceration of her left leg. she was admitted following a recent fall. Patient apparently has a fairly common falls at home. She has numerous complaints including back and shoulder and leg pain. There hospitalization her back pain has eased considerably. She still has left leg pain. The pain itself is nonradicular. Is not associated with any definite motor weakness. She has chronic diabetic polyneuropathy in both feet. She carries with her a diagnosis of spinal stenosis. Patient does have moderate spinal stenosis at L3-4 off to the right side secondary to a broad-based disc bulge and early degenerative spinal listhesis with mild to moderate stenosis at L4-5. At neither of these levels do I feel that there is sufficient stenosis on the left side to explain any type of left lower extremity pain.  At this point I do feel the patient has some degree of lumbar stenosis but I do not feel that any type of surgical intervention is warranted her appropriate for her. I then continued efforts at diabetic wound care and diabetes control we'll help manage her diabetic polyneuropathy. I think it is his polyneuropathy is most likely responsible for her falls rather than the stenosis itself. She is at no risk from the current degree of stenosis that she possesses.

## 2011-06-30 ENCOUNTER — Inpatient Hospital Stay (HOSPITAL_COMMUNITY): Payer: Medicaid Other

## 2011-06-30 DIAGNOSIS — L0390 Cellulitis, unspecified: Secondary | ICD-10-CM

## 2011-06-30 DIAGNOSIS — E1142 Type 2 diabetes mellitus with diabetic polyneuropathy: Secondary | ICD-10-CM

## 2011-06-30 DIAGNOSIS — M6282 Rhabdomyolysis: Secondary | ICD-10-CM

## 2011-06-30 DIAGNOSIS — L0291 Cutaneous abscess, unspecified: Secondary | ICD-10-CM

## 2011-06-30 LAB — CBC
HCT: 31.2 % — ABNORMAL LOW (ref 36.0–46.0)
Hemoglobin: 10.3 g/dL — ABNORMAL LOW (ref 12.0–15.0)
MCH: 35 pg — ABNORMAL HIGH (ref 26.0–34.0)
MCHC: 33 g/dL (ref 30.0–36.0)
MCV: 106.1 fL — ABNORMAL HIGH (ref 78.0–100.0)
Platelets: 144 10*3/uL — ABNORMAL LOW (ref 150–400)
RBC: 2.94 MIL/uL — ABNORMAL LOW (ref 3.87–5.11)
RDW: 15.2 % (ref 11.5–15.5)
WBC: 8.8 10*3/uL (ref 4.0–10.5)

## 2011-06-30 LAB — GLUCOSE, CAPILLARY
Glucose-Capillary: 153 mg/dL — ABNORMAL HIGH (ref 70–99)
Glucose-Capillary: 201 mg/dL — ABNORMAL HIGH (ref 70–99)
Glucose-Capillary: 272 mg/dL — ABNORMAL HIGH (ref 70–99)
Glucose-Capillary: 304 mg/dL — ABNORMAL HIGH (ref 70–99)

## 2011-06-30 LAB — BASIC METABOLIC PANEL
BUN: 25 mg/dL — ABNORMAL HIGH (ref 6–23)
CO2: 21 mEq/L (ref 19–32)
Calcium: 9.4 mg/dL (ref 8.4–10.5)
Chloride: 110 mEq/L (ref 96–112)
Creatinine, Ser: 0.93 mg/dL (ref 0.50–1.10)
GFR calc Af Amer: 76 mL/min — ABNORMAL LOW (ref >90)
GFR calc non Af Amer: 66 mL/min — ABNORMAL LOW (ref >90)
Glucose, Bld: 152 mg/dL — ABNORMAL HIGH (ref 70–99)
Potassium: 5.2 mEq/L — ABNORMAL HIGH (ref 3.5–5.1)
Sodium: 137 mEq/L (ref 135–145)

## 2011-06-30 MED ORDER — ALBUTEROL SULFATE (5 MG/ML) 0.5% IN NEBU: 2.5000 mg | INHALATION_SOLUTION | Freq: Two times a day (BID) | RESPIRATORY_TRACT | Status: DC

## 2011-06-30 MED ORDER — BENZONATATE 100 MG PO CAPS
100.0000 mg | ORAL_CAPSULE | Freq: Three times a day (TID) | ORAL | Status: DC
  Administered 2011-06-30 – 2011-07-01 (×3): 100 mg via ORAL
  Filled 2011-06-30 (×6): qty 1

## 2011-06-30 MED ORDER — ALBUTEROL SULFATE (5 MG/ML) 0.5% IN NEBU: 2.5000 mg | INHALATION_SOLUTION | RESPIRATORY_TRACT | Status: DC | PRN

## 2011-06-30 MED ORDER — CLONIDINE HCL 0.2 MG PO TABS
0.2000 mg | ORAL_TABLET | Freq: Every day | ORAL | Status: DC
  Administered 2011-06-30: 0.2 mg via ORAL
  Filled 2011-06-30 (×2): qty 1

## 2011-06-30 MED ORDER — POLYETHYLENE GLYCOL 3350 17 G PO PACK
1.0000 | PACK | Freq: Two times a day (BID) | ORAL | Status: DC
  Administered 2011-06-30 – 2011-07-01 (×2): 17 g via ORAL
  Filled 2011-06-30 (×3): qty 1

## 2011-06-30 NOTE — Progress Notes (Signed)
Subjective: [name] is seen lying comfortably in bed. She states her chest pain is still present on the R side, but has improved with use of lidocaine patch. Pain is worst with movement and deep breaths. Pt reports mild dry cough. Pt received albuterol nebulizer yesterday. Pt states that this helped her breathing. Pt reports ongoing LE weakness with improvement in L LE pain. Last BM Sunday, per pt. She denies abdominal pain. She reports improved appetite and plenty of po fluid intake. Pt received first dose of Miralax yesterday.   Objective: Vital signs in last 24 hours: Temp:  [96.6 F (35.9 C)-98.7 F (37.1 C)] 97.9 F (36.6 C) (11/14 0608) Pulse Rate:  [69-89] 69  (11/14 0608) Resp:  [17-28] 18  (11/14 0608) BP: (101-123)/(51-79) 119/79 mmHg (11/14 0608) SpO2:  [95 %-99 %] 99 % (11/14 0608) Weight change:  Last BM Date: 06/27/11  Intake/Output from previous day: 11/13 0701 - 11/14 0700 In: 2265 [P.O.:1240; I.V.:825; IV Piggyback:200] Out: 1025 [Urine:1025] Balance = (+) 1240  Physical Exam:  General: Obese, alert, oriented to person and place, cooperative with exam.  HEENT: Normocephalic, atraumatic, EOMI, moist mucous membranes.  Neck: Obese, supple, no JVD, no lymphadenopathy.  Chest: Tender to palpation of anterior R lower ribs. L side nontender.  Heart: RRR, S1S2, no murmurs/rubs/gallops.  Lungs: Pt unwilling to take deep breaths secondary to pain with poor air movement but clear to auscultation b/l. Occasional wheeze.  Abdomen: Obese, bruising improved, RUQ scar s/p cholecystectomy, reducible umbilical hernia, no tenderness to palpation, no guarding, no rebound, bowel sounds present.  Extremities: Improvement of abrasions and bruising of knees b/l. R LE with skin discoloration from chronic venous stasis. L LE with decreased redness and tenderness of calf and pretibial area. Pretibial ulcer present. Various small foot ulcers. Neuro: DTRs 2/4 upper and lower extremities,  sensation grossly intact, pt able to bend knees and lift legs off bed again this morning.  Psych: Pt not tearful or anxious during exam.    Lab Results:  Basename 06/30/11 0642 06/29/11 0500  WBC 8.8 12.8*  HGB 10.3* 10.2*  HCT 31.2* 29.9*  PLT 144* 140*   BMET  Basename 06/30/11 0642 06/29/11 0500  NA 137 137  K 5.2* 4.5  CL 110 109  CO2 21 21  GLUCOSE 152* 317*  BUN 25* 25*  CREATININE 0.93 0.93  CALCIUM 9.4 8.8    Studies/Results: LUMBAR MRI WITHOUT CONTAST IMPRESSION:  At L3-4, there is severe spinal stenosis due to central and right-  sided disc protrusion with osteophyte. Expected impingement of the  right L3 and right L4 nerve roots. This is similar to the prior  study.  L4-5: There is grade 1 anterior slip. Mild spinal stenosis is  unchanged. Interval development of a prominent superior endplate  defect in the L5 vertebral body without associated bone marrow  edema.  EKG 06/29/11: NSR, RBBB, LAD  Medications: I have reviewed the patient's current medications.  Assessment/Plan: [name] is a 60 y.o. year old obese female with an extensive PMH who lives at home with her niece/legal guardian Helene Kelp. She presented with increasing lower extremity weakness and falls as well as L LE cellulitis. She is being treated for L LE cellulitis and has been seen by neurosurgery to address LE weakness. Physical therapy has recommended CIR.   1. Left leg pain: cellulitis  DVT/PE ruled out with negative LE doppler ultrasound and negative VQ scan. Full dose lovenox stopped. Erythema, warmth, and tenderness have greatly improved.  Pt remains afebrile and WBC continues to decline (12.8 yesterday to 8.8 today).  - IV vancomycin changed yesterday to oral levaquin and clindamycin.   - Will continue to monitor pt closely by checking vitals and performing physical exam of L LE.   2. LE weakness/Falls  MRI yesterday showed severe stenosis at L3-4 which is unchanged from 2011 study.  Pt seen by Dr. Annette Stable (neurosurgery) yesterday. Dr. Annette Stable does not believe falls and weakness are due to stenosis and does not recommend surgical intervention. He thinks diabetic autonomic dysfunction is the cause of her weakness. Also, per pt's legal guardian, there may be a psych component due to history of similar episodes during times of stress. PT has helped the pt in the past. PT is recommending CIR. Pt's strength and mobility stable on exam - she is able to lift her legs off the bed and bend her knees.   - Pt to continue working with PT, which has helped in the past.  - CIR consult is pending. Will f/u with SW. - Continue management of diabetes.  3. Chest pain  Pt reports improvement of R sided chest pain this morning with use of lidocaine patch. Area remains tender to palpation of costochondral junction and R anterior lower ribs. EKG yesterday with NSR, RBBB, LAD - largely unchanged from previous. CXR on 11/11 showed no infiltrate. Vital signs stable and WNL. Etiology likely musculoskeletal. Patient also reports falling on that side. - Will get rib x-ray to evaluate for fracture.  - Will continue lidoderm patch.  - Will follow pt clinically.  4. Elevated CK  Pt with recent fall and unable to get up for 9 hours. CK in elevated (2112) on admission - decreased to 744. Cr has from 1.5 on admission to 0.93 today.  - Pt has been receiving NS at 75 with good po fluid intake.  - Will plan to d/c IVF today.   5. Insulin dependent DM  Pt takes metformin, novolog, and 40 units lantus bid at home. A1C 7.6. CBGs 150s this morning.  - Orders placed to change Lantus to 40 bid yesterday but this will be started today. Seen by pharmacist Dr. Valentina Lucks regarding diabetes on 10/15. Had been on Lantus 35 tid. Will keep Lantus at 40 bid today and monitor. Received 44 units Novolog yesterday. At home, she was getting 20/40/50 of Novolog. -Will continue SSI.  -Continue to hold metformin.  -Monitor CBGs per protocol.  Continue ADA diet.   6. HTN  Pt on clonidine, HCTZ, and spironolactone at home. Pt has been normotensive.  - Continue clonidine, HCTZ and spironolactone have been held due to elevated Cr 1.5 at admission.  (Can continue to hold due to BPs 110-120/60-70s.) - Patient not on ARB (lisinopril causes cough) for renal protection. Will discuss with team about starting or start as outpatient since patient has decent follow-up with one of our residents. Consider starting Cozaar with her history of gout. If we do, we will try to wean off of clonidine.  - Monitor BP per protocol.   7. CHF  EF 55-60% in 08/2009. Pt taking lasix 80mg  twice daily. No edema present on exam, lungs clear to auscultation.  - Will hold lasix and spironolactone.  - Continue monitoring clinically for signs of CHF.   8. Chronic steroid dependence  Pt on 5mg  prednisone daily at home.  - Increased to 60mg  daily on admission to help with weakness and in case of adrenal insufficiency.  - Continue current dose, will  discuss decreasing slowly.   9. Obstructive sleep apnea  Pt uses CPAP for sleep at home. Pt using machine while inpatient and reports sleeping well.  - Will continue CPAP.   10. Rheumatoid arthritis  Per pt, diagnosed in 2011. Pt takes hydroxychloroquine, methotrexate, folic acid, and prednisone 5mg  daily. Currently stable.  - Will continue home medications with increased prednisone dose as per above.   11. Anxiety, depression, hx of schizophrenia  Pt no longer visibly anxious and tearful.  - Will continue home medications: doxepin and escitalopram.   12. Fibromyalgia  Currently stable on Lyrica.  - Will continue Lyrica. [name] Kitchen   13. GERD with Barrett's esophagus  Currently stable on Nexium and Zantac at home.  - Will continue PPI.   14. Hyperlipidemia  Simvastatin discontinued due to elevated CK per pharmacy recommendation  - Will plan to continue simvastatin upon discahrge.   15. History of gout  Currently  without acute flare. Pt taking allopurinol for prophylaxis.  - Will continue allopurinol.   16. History of hypokalemia  Potassium improved from 3.2 yesterday to 4.5 today. Pt takes 80 mEq KCl tid at home. Currently receiving 29meq bid.  Potassium 3.2 on admission. Pt takes 29mEq KCl at home. Currently receiving 56mEq bid. Potassium yesterday 4.5, today 5.2. - Will hold KCl today.   17. History of iron deficiency anemia and now with megaloblastic anemia Pt takes iron supplementation as outpatient. Hgb stable at 10.3. B12 and folate were WNL.  - Iron supplementation has been held due to pt complaint of constipation. Will continue to hold.   18. Constipation No bowel movement since Sunday 11/11. Pt received first dose of Miralax yesterday. Pt denies abdominal discomfort. Abdominal exam benign.  - Iron held.  - Will increase Miralax and follow pt clinically.   19. Asthma  Pt using Advair and albuterol at home. Pt received albuterol nebulizer yesterday which she states helped her breathing.  - Will schedule albuterol nebs bid today.  - Will give Tessalon scheduled for today for cough since it worsens right chest wall pain.  - Will continue Advair and albuterol.   20. FEN/GI  Pt with improved appetite. On ADA diet. Pt seen by nutrition who ordered Glucerna shakes.  - Will discontinue NS 75/hr today due to good oral fluid intake.    21. Prophylaxis  DVT: Heparin 5000 tid subq.  GI: Pt on PPI.   22. Disposition  Pt's cellulitis is much improved. Weakness largely unchanged.  -PT recommends CIR. -Will discuss with SW regarding placement.   Johny Blamer, MS-IV      LOS: 3 days   [place] PARK, ANGELA 06/30/2011, 8:26 AM

## 2011-06-30 NOTE — Consult Note (Signed)
Physical Medicine and Rehabilitation Consult  Reason for Consult: BLE weakness and falls.  Referring Phsyician: Dr. Andria Frames  HPI:   [name] is an 60 y.o. 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   Patient with generalized weakness for the past months with increased falls.  Noted to have LLE cellulitis and started on antibiotics for treatment.  Noted to have rhabdomyolysis with CK @2112  and treated with gentle hydration.  BLE dopplers done and negative for DVT.  PT evaluation done 11/12 and patient limited by pain.  MRI lumbar spine done and Dr. Annette Stable consulted for input.  He felt that patient's LLE pain not related to stenosis and no surgical intervention needed.  Pain an falls multifactorial likely due to polyneuropathy.  Patient deconditioned and  MD, PT recommending CIR.  Review of Systems  Eyes: Positive for blurred vision.  Respiratory: Negative.   Cardiovascular: Positive for chest pain (Right chest wall and rib pain.) and leg swelling.  Genitourinary: Positive for urgency.  Musculoskeletal: Positive for myalgias and falls (for past couple of months due to LE instability and numbness.  Tends to shuffle when walking.).  Skin:       Stasis changes BLE.  Erythems left shin.  Multiple dressing on BLE.  Neurological: Positive for weakness.       Numbness BLE.   Past Medical History  Diagnosis Date  . Allergy   . Anxiety   . Depression   . Diabetes mellitus   . GERD (gastroesophageal reflux disease)   . Hyperlipidemia   . Hypertension   . Schizophrenia   . Polypharmacy   . Physical deconditioning   . Barrett esophagus     Barretts Esophagus - EGD 8/07. (Repeat 2-3 years)  . Nephrolithiasis     2008  . Asthma     PFT's (10/14/06)- mild obstructive airway dz, poor effort  . CPAP (continuous positive airway pressure) dependence    2/2 obesity hyperventilation syndrome  . CHF (congestive heart failure)     EF 55% on echo (5/10), repeat ECHO (08/29/09) EF 0000000, Grade 1 Diastolic Dsfxn, Lt atrium mildly dilated, PA systolic pressure mildly elevated.  . Obesity   . PE (pulmonary embolism)     2010  . Gout   . Hypokalemia   . Fibromyalgia   . Vitamin D deficiency   . Steroid dependence   . Peripheral neuropathy   . Back pain   . Anemia    Past Surgical History  Procedure Date  . Colonoscopy     benign polyps (12/2000), repeat colonoscopy performed in 2007  . Endometrial biopsy      10/03 benign columnar mucosa (limited material) .Bilateral TKR   Family History  Problem Relation Age of Onset  . Bone cancer Mother   . COPD Father    Social History: Lives with family.  Able to do some house work.  Smoked for a few years but reports that she quit smoking about 39 years ago. She has never used smokeless tobacco. She reports that she does not drink alcohol or use illicit drugs.   Allergies  Allergen Reactions  . Benztropine Mesylate     REACTION: Alopecia and white spots on eyes  . Codeine     REACTION: Mouth Swelling - Tachycardia  . Diazepam     REACTION: COMA  . Diphenhydramine Hcl  REACTION: Tachycardia , anxiousness  . Lisinopril     REACTION: cough  . Penicillins     REACTION: ulcers in mouth, swelling, throat swelling  . Sulfonamide Derivatives     REACTION: rash - blisters  . Thioridazine Hcl Other (See Comments)    REACTION: swelling      Home: Home Living Lives With: Other (Comment) (neice) Receives Help From: Niece who works days. Type of Home: Mobile home Home Layout: One level Home Access: Ramp Additional Comments: pt unsure of home set up secondary to moving in a few days.     Functional History: Prior Function Level of Independence: Independent with basic ADLs;Independent with gait;Independent with transfers.  Reports no assistive device.  Has a walker and had started  using it intermittently due to falls. Needs assistance with homemaking  Able to Take Stairs?: Yes Driving: No Functional Status:  Mobility: Bed Mobility Bed Mobility: Yes Rolling Right: 6: Modified independent (Device/Increase time) Rolling Left: 6: Modified independent (Device/Increase time) Right Sidelying to Sit: 4: Min assist Right Sidelying to Sit Details (indicate cue type and reason): Pt pulling on bil hands assist for BIl LE secondary to pain. C/o pain in R flank increasing with activity. Transfers Transfers: Yes Sit to Stand: 4: Min assist Sit to Stand Details (indicate cue type and reason): assist for anterior wt shift for improved use of quads to extend knees.   Stand to Sit: 4: Min assist Stand to Sit Details: assist for controlled descent.   Ambulation/Gait Ambulation/Gait: Yes Ambulation/Gait Assistance: 1: +2 Total assist Ambulation/Gait Assistance Details (indicate cue type and reason): Assist at bil UE to stabilize pt, constant cues to address pt sliding left foot during Lt swing phase.   Ambulation Distance (Feet): 15 Feet Assistive device: 2 person hand held assist Gait Pattern: Decreased step length - right;Decreased step length - left;Shuffle Stairs: No    ADL:    Cognition: Cognition Arousal/Alertness: Awake/alert Orientation Level: Oriented X4 Cognition Arousal/Alertness: Awake/alert Overall Cognitive Status: Appears within functional limits for tasks assessed Orientation Level: Oriented X4  Blood pressure 119/79, pulse 69, temperature 97.9 F (36.6 C), temperature source Oral, resp. rate 18, height 5\' 1"  (1.549 m), weight 85.1 kg (187 lb 9.8 oz), SpO2 98.00%. @PHYSEXAMBYAGE2 @ Physical Exam  Constitutional: She appears well-nourished.  Neck: Normal range of motion.  Cardiovascular: Normal rate.   Pulmonary/Chest: Effort normal.  Abdominal: Soft.  Neurological: She is alert. A sensory deficit is present. No cranial nerve deficit.  Reflex  Scores:      Tricep reflexes are 1+ on the right side and 1+ on the left side.      Bicep reflexes are 1+ on the right side and 1+ on the left side.      Brachioradialis reflexes are 1+ on the right side and 1+ on the left side.      Patellar reflexes are 1+ on the right side and 1+ on the left side.      Achilles reflexes are 1+ on the right side and 1+ on the left side.      Patient with bilateral stocking glove sensory deficits left greater than right. Patient still is able to discern gross touch on the left side. Patient with weakness distally in both legs with trace ankle dorsiflexion and plantar and plantar flexion on either side. Her hip and knee strength is grossly 2/5. B. strength grossly 3+ to 4/5.  Skin: Bruising, ecchymosis, petechiae and rash noted. There is erythema.  Patient with chronic vascular changes in the bilateral legs left greater than right. Patient with 2 lesions which were dressed on left side. Left leg remains erythematous and slightly warm.      Mr Lumbar Spine Wo Contrast: 06/29/2011  *  IMPRESSION: At L3-4, there is severe spinal stenosis due to central and right- sided disc protrusion with osteophyte. Expected impingement of the right L3 and right L4 nerve roots.  This is similar to the prior study.  L4-5:  There is grade 1 anterior slip.  Mild spinal stenosis is unchanged.  Interval development of a prominent superior endplate defect in the L5 vertebral body without associated bone marrow edema.  Original Report Authenticated By: Truett Perna, M.D.    Assessment/Plan: Diagnosis: Rhabdomyolysis and deconditioning in this setting upper for neuropathy. Patient also with documented spinal stenosis at L3-L4. 1. Does the need for close, 24 hr/day medical supervision in concert with the patient's rehab needs make it unreasonable for this patient to be served in a less intensive setting? Yes 2. Co-Morbidities requiring supervision/potential complications: Diabetes  type 2 uncontrolled history of gout schizophrenia hypertension and history of diastolic heart failure and 3. Due to bladder management, bowel management, safety, skin/wound care, disease management, medication administration and pain management, does the patient require 24 hr/day rehab nursing? Yes 4. Does the patient require coordinated care of a physician, rehab nurse, PT (1-2 hrs/day, 5 days/week) and OT (1-2 hrs/day, 5 days/week) to address physical and functional deficits in the context of the above medical diagnosis(es)? Yes Addressing deficits in the following areas: balance, bathing, bowel/bladder control, dressing, endurance, feeding, locomotion, strength, toileting and transferring 5. Can the patient actively participate in an intensive therapy program of at least 3 hrs of therapy per day at least 5 days per week? Yes 6. The potential for patient to make measurable gains while on inpatient rehab is excellent 7. Anticipated functional outcomes upon discharge from inpatients are modified independent to PT, modified independent to set up OT,  8. Estimated rehab length of stay to reach the above functional goals is: 2-2 and half weeks. 9. Does the patient have adequate social supports to accommodate these discharge functional goals? Potentially 10. Anticipated D/C setting: Home need to followup social supports the 11. Anticipated post D/C treatments: [facility] therapy 12. Overall Rehab/Functional Prognosis: good  RECOMMENDATIONS: This patient's condition is appropriate for continued rehabilitative care in the following setting: CIR Patient has agreed to participate in recommended program. Yes Note that insurance prior authorization may be required for reimbursement for recommended care.  Comment: A shunt is quite motivated to come to inpatient rehabilitation. She states that she will had been quite independent at home premorbidly. Need to followup on [name]'s availability to provide supervision and  assistance at home once discharged from rehabilitation.   Bary Leriche 06/30/2011

## 2011-07-01 ENCOUNTER — Encounter (HOSPITAL_COMMUNITY): Payer: Self-pay | Admitting: *Deleted

## 2011-07-01 ENCOUNTER — Telehealth: Payer: Self-pay | Admitting: Family Medicine

## 2011-07-01 ENCOUNTER — Inpatient Hospital Stay (HOSPITAL_COMMUNITY)
Admission: RE | Admit: 2011-07-01 | Discharge: 2011-07-12 | DRG: 945 | Disposition: A | Discharge status: Home Health | Payer: Medicaid Other | Source: Ambulatory Visit | Attending: Physical Medicine & Rehabilitation | Admitting: Physical Medicine & Rehabilitation

## 2011-07-01 DIAGNOSIS — E559 Vitamin D deficiency, unspecified: Secondary | ICD-10-CM

## 2011-07-01 DIAGNOSIS — F209 Schizophrenia, unspecified: Secondary | ICD-10-CM

## 2011-07-01 DIAGNOSIS — R531 Weakness: Secondary | ICD-10-CM

## 2011-07-01 DIAGNOSIS — IMO0002 Reserved for concepts with insufficient information to code with codable children: Secondary | ICD-10-CM

## 2011-07-01 DIAGNOSIS — R0789 Other chest pain: Secondary | ICD-10-CM

## 2011-07-01 DIAGNOSIS — L02419 Cutaneous abscess of limb, unspecified: Secondary | ICD-10-CM

## 2011-07-01 DIAGNOSIS — Z79899 Other long term (current) drug therapy: Secondary | ICD-10-CM

## 2011-07-01 DIAGNOSIS — F341 Dysthymic disorder: Secondary | ICD-10-CM

## 2011-07-01 DIAGNOSIS — J449 Chronic obstructive pulmonary disease, unspecified: Secondary | ICD-10-CM

## 2011-07-01 DIAGNOSIS — E1069 Type 1 diabetes mellitus with other specified complication: Secondary | ICD-10-CM

## 2011-07-01 DIAGNOSIS — E785 Hyperlipidemia, unspecified: Secondary | ICD-10-CM

## 2011-07-01 DIAGNOSIS — E7800 Pure hypercholesterolemia, unspecified: Secondary | ICD-10-CM

## 2011-07-01 DIAGNOSIS — M109 Gout, unspecified: Secondary | ICD-10-CM

## 2011-07-01 DIAGNOSIS — L97509 Non-pressure chronic ulcer of other part of unspecified foot with unspecified severity: Secondary | ICD-10-CM

## 2011-07-01 DIAGNOSIS — Z5189 Encounter for other specified aftercare: Principal | ICD-10-CM

## 2011-07-01 DIAGNOSIS — Z87891 Personal history of nicotine dependence: Secondary | ICD-10-CM

## 2011-07-01 DIAGNOSIS — K5900 Constipation, unspecified: Secondary | ICD-10-CM

## 2011-07-01 DIAGNOSIS — E876 Hypokalemia: Secondary | ICD-10-CM

## 2011-07-01 DIAGNOSIS — D649 Anemia, unspecified: Secondary | ICD-10-CM

## 2011-07-01 DIAGNOSIS — I509 Heart failure, unspecified: Secondary | ICD-10-CM

## 2011-07-01 DIAGNOSIS — R5381 Other malaise: Secondary | ICD-10-CM | POA: Diagnosis present

## 2011-07-01 DIAGNOSIS — Z86711 Personal history of pulmonary embolism: Secondary | ICD-10-CM

## 2011-07-01 DIAGNOSIS — L03119 Cellulitis of unspecified part of limb: Secondary | ICD-10-CM

## 2011-07-01 DIAGNOSIS — I69959 Hemiplegia and hemiparesis following unspecified cerebrovascular disease affecting unspecified side: Secondary | ICD-10-CM

## 2011-07-01 DIAGNOSIS — E1065 Type 1 diabetes mellitus with hyperglycemia: Secondary | ICD-10-CM

## 2011-07-01 DIAGNOSIS — J4489 Other specified chronic obstructive pulmonary disease: Secondary | ICD-10-CM

## 2011-07-01 DIAGNOSIS — M069 Rheumatoid arthritis, unspecified: Secondary | ICD-10-CM

## 2011-07-01 DIAGNOSIS — I5032 Chronic diastolic (congestive) heart failure: Secondary | ICD-10-CM

## 2011-07-01 DIAGNOSIS — E669 Obesity, unspecified: Secondary | ICD-10-CM

## 2011-07-01 DIAGNOSIS — Z86718 Personal history of other venous thrombosis and embolism: Secondary | ICD-10-CM

## 2011-07-01 DIAGNOSIS — I10 Essential (primary) hypertension: Secondary | ICD-10-CM

## 2011-07-01 DIAGNOSIS — K219 Gastro-esophageal reflux disease without esophagitis: Secondary | ICD-10-CM

## 2011-07-01 DIAGNOSIS — E1142 Type 2 diabetes mellitus with diabetic polyneuropathy: Secondary | ICD-10-CM

## 2011-07-01 DIAGNOSIS — M6282 Rhabdomyolysis: Secondary | ICD-10-CM

## 2011-07-01 DIAGNOSIS — E1049 Type 1 diabetes mellitus with other diabetic neurological complication: Secondary | ICD-10-CM

## 2011-07-01 DIAGNOSIS — G4733 Obstructive sleep apnea (adult) (pediatric): Secondary | ICD-10-CM

## 2011-07-01 LAB — CREATININE, SERUM
Creatinine, Ser: 0.86 mg/dL (ref 0.50–1.10)
GFR calc Af Amer: 84 mL/min — ABNORMAL LOW (ref >90)
GFR calc non Af Amer: 73 mL/min — ABNORMAL LOW (ref >90)

## 2011-07-01 LAB — CBC
HCT: 30.4 % — ABNORMAL LOW (ref 36.0–46.0)
HCT: 35 % — ABNORMAL LOW (ref 36.0–46.0)
Hemoglobin: 10.3 g/dL — ABNORMAL LOW (ref 12.0–15.0)
Hemoglobin: 12.1 g/dL (ref 12.0–15.0)
MCH: 35.4 pg — ABNORMAL HIGH (ref 26.0–34.0)
MCH: 35.8 pg — ABNORMAL HIGH (ref 26.0–34.0)
MCHC: 33.9 g/dL (ref 30.0–36.0)
MCHC: 34.6 g/dL (ref 30.0–36.0)
MCV: 104.5 fL — ABNORMAL HIGH (ref 78.0–100.0)
MCV: 103.6 fL — ABNORMAL HIGH (ref 78.0–100.0)
Platelets: 146 10*3/uL — ABNORMAL LOW (ref 150–400)
Platelets: 170 10*3/uL (ref 150–400)
RBC: 2.91 MIL/uL — ABNORMAL LOW (ref 3.87–5.11)
RBC: 3.38 MIL/uL — ABNORMAL LOW (ref 3.87–5.11)
RDW: 14.7 % (ref 11.5–15.5)
RDW: 14.5 % (ref 11.5–15.5)
WBC: 6.6 10*3/uL (ref 4.0–10.5)
WBC: 9 10*3/uL (ref 4.0–10.5)

## 2011-07-01 LAB — GLUCOSE, CAPILLARY
Glucose-Capillary: 150 mg/dL — ABNORMAL HIGH (ref 70–99)
Glucose-Capillary: 260 mg/dL — ABNORMAL HIGH (ref 70–99)
Glucose-Capillary: 276 mg/dL — ABNORMAL HIGH (ref 70–99)
Glucose-Capillary: 207 mg/dL — ABNORMAL HIGH (ref 70–99)

## 2011-07-01 LAB — BASIC METABOLIC PANEL
BUN: 23 mg/dL (ref 6–23)
CO2: 23 mEq/L (ref 19–32)
Calcium: 9.4 mg/dL (ref 8.4–10.5)
Chloride: 106 mEq/L (ref 96–112)
Creatinine, Ser: 0.82 mg/dL (ref 0.50–1.10)
GFR calc Af Amer: 89 mL/min — ABNORMAL LOW (ref >90)
GFR calc non Af Amer: 77 mL/min — ABNORMAL LOW (ref >90)
Glucose, Bld: 160 mg/dL — ABNORMAL HIGH (ref 70–99)
Potassium: 4.1 mEq/L (ref 3.5–5.1)
Sodium: 137 mEq/L (ref 135–145)

## 2011-07-01 LAB — VITAMIN D 25 HYDROXY (VIT D DEFICIENCY, FRACTURES): Vit D, 25-Hydroxy: 26 ng/mL — ABNORMAL LOW (ref 30–89)

## 2011-07-01 MED ORDER — ALLOPURINOL 300 MG PO TABS
300.0000 mg | ORAL_TABLET | Freq: Two times a day (BID) | ORAL | Status: DC
  Administered 2011-07-01 – 2011-07-12 (×22): 300 mg via ORAL
  Filled 2011-07-01 (×24): qty 1

## 2011-07-01 MED ORDER — MUPIROCIN CALCIUM 2 % EX CREA
TOPICAL_CREAM | Freq: Two times a day (BID) | CUTANEOUS | Status: DC
  Administered 2011-07-02 – 2011-07-11 (×16): via TOPICAL
  Administered 2011-07-12: 1 via TOPICAL
  Filled 2011-07-01: qty 15

## 2011-07-01 MED ORDER — DOXYCYCLINE HYCLATE 100 MG PO TABS
100.0000 mg | ORAL_TABLET | Freq: Two times a day (BID) | ORAL | Status: DC
  Administered 2011-07-01 – 2011-07-11 (×21): 100 mg via ORAL
  Filled 2011-07-01 (×24): qty 1

## 2011-07-01 MED ORDER — ALBUTEROL SULFATE (5 MG/ML) 0.5% IN NEBU: 2.5000 mg | INHALATION_SOLUTION | RESPIRATORY_TRACT | Status: DC | PRN

## 2011-07-01 MED ORDER — ACETAMINOPHEN 325 MG PO TABS: 325.0000 mg | ORAL_TABLET | ORAL | Status: DC | PRN

## 2011-07-01 MED ORDER — BENZONATATE 100 MG PO CAPS
100.0000 mg | ORAL_CAPSULE | Freq: Three times a day (TID) | ORAL | Status: DC | PRN
  Filled 2011-07-01: qty 1

## 2011-07-01 MED ORDER — FLUTICASONE-SALMETEROL 500-50 MCG/DOSE IN AEPB
1.0000 | INHALATION_SPRAY | Freq: Two times a day (BID) | RESPIRATORY_TRACT | Status: DC
  Administered 2011-07-01 – 2011-07-11 (×18): 1 via RESPIRATORY_TRACT
  Filled 2011-07-01 (×4): qty 14

## 2011-07-01 MED ORDER — INSULIN ASPART 100 UNIT/ML ~~LOC~~ SOLN: 5.0000 [IU] | Freq: Three times a day (TID) | SUBCUTANEOUS | Status: DC

## 2011-07-01 MED ORDER — ESOMEPRAZOLE MAGNESIUM 40 MG PO CPDR
40.0000 mg | DELAYED_RELEASE_CAPSULE | Freq: Every day | ORAL | Status: DC
  Administered 2011-07-02 – 2011-07-12 (×11): 40 mg via ORAL
  Filled 2011-07-01 (×11): qty 1

## 2011-07-01 MED ORDER — FOLIC ACID 1 MG PO TABS
1.0000 mg | ORAL_TABLET | Freq: Every day | ORAL | Status: DC
  Administered 2011-07-02 – 2011-07-12 (×11): 1 mg via ORAL
  Filled 2011-07-01 (×12): qty 1

## 2011-07-01 MED ORDER — ALBUTEROL SULFATE HFA 108 (90 BASE) MCG/ACT IN AERS
1.0000 | INHALATION_SPRAY | RESPIRATORY_TRACT | Status: DC
Start: 2011-07-01 — End: 2011-07-01

## 2011-07-01 MED ORDER — FLUTICASONE-SALMETEROL 500-50 MCG/DOSE IN AEPB: 1.0000 | INHALATION_SPRAY | Freq: Two times a day (BID) | RESPIRATORY_TRACT | Status: DC

## 2011-07-01 MED ORDER — POTASSIUM CHLORIDE CRYS ER 20 MEQ PO TBCR
20.0000 meq | EXTENDED_RELEASE_TABLET | Freq: Two times a day (BID) | ORAL | Status: DC
  Administered 2011-07-01 – 2011-07-02 (×3): 20 meq via ORAL
  Filled 2011-07-01 (×4): qty 1

## 2011-07-01 MED ORDER — ACETAMINOPHEN 325 MG PO TABS
325.0000 mg | ORAL_TABLET | ORAL | Status: DC | PRN
  Administered 2011-07-12: 650 mg via ORAL
  Filled 2011-07-01: qty 2

## 2011-07-01 MED ORDER — AZELASTINE HCL 0.1 % NA SOLN: 1.0000 | Freq: Two times a day (BID) | NASAL | Status: DC

## 2011-07-01 MED ORDER — HYDROXYCHLOROQUINE SULFATE 200 MG PO TABS
200.0000 mg | ORAL_TABLET | Freq: Two times a day (BID) | ORAL | Status: DC
  Administered 2011-07-01 – 2011-07-12 (×22): 200 mg via ORAL
  Filled 2011-07-01 (×25): qty 1

## 2011-07-01 MED ORDER — FERROUS SULFATE 325 (65 FE) MG PO TABS
325.0000 mg | ORAL_TABLET | Freq: Three times a day (TID) | ORAL | Status: DC
  Administered 2011-07-01 – 2011-07-12 (×33): 325 mg via ORAL
  Filled 2011-07-01 (×35): qty 1

## 2011-07-01 MED ORDER — NON FORMULARY: 40.0000 mg | Freq: Every day | Status: DC

## 2011-07-01 MED ORDER — HYDROCODONE-ACETAMINOPHEN 5-325 MG PO TABS
1.0000 | ORAL_TABLET | ORAL | Status: DC | PRN
  Administered 2011-07-01 – 2011-07-10 (×20): 2 via ORAL
  Filled 2011-07-01 (×20): qty 2

## 2011-07-01 MED ORDER — METFORMIN HCL 500 MG PO TABS
500.0000 mg | ORAL_TABLET | Freq: Two times a day (BID) | ORAL | Status: DC
  Administered 2011-07-02 – 2011-07-12 (×21): 500 mg via ORAL
  Filled 2011-07-01 (×24): qty 1

## 2011-07-01 MED ORDER — FERROUS SULFATE 325 (65 FE) MG PO TABS: 325.0000 mg | ORAL_TABLET | Freq: Three times a day (TID) | ORAL | Status: DC

## 2011-07-01 MED ORDER — SORBITOL 70 % SOLN: 30.0000 mL | Freq: Two times a day (BID) | Status: DC | PRN

## 2011-07-01 MED ORDER — PREDNISONE 5 MG PO TABS
5.0000 mg | ORAL_TABLET | Freq: Every day | ORAL | Status: DC
  Administered 2011-07-02 – 2011-07-12 (×11): 5 mg via ORAL
  Filled 2011-07-01 (×12): qty 1

## 2011-07-01 MED ORDER — PREDNISONE 5 MG PO TABS: 5.0000 mg | ORAL_TABLET | Freq: Every day | ORAL | Status: DC

## 2011-07-01 MED ORDER — CLONIDINE HCL 0.2 MG PO TABS
0.4000 mg | ORAL_TABLET | Freq: Every day | ORAL | Status: DC
  Filled 2011-07-01: qty 2

## 2011-07-01 MED ORDER — METHOCARBAMOL 500 MG PO TABS
500.0000 mg | ORAL_TABLET | Freq: Four times a day (QID) | ORAL | Status: DC | PRN
  Administered 2011-07-01 – 2011-07-12 (×8): 500 mg via ORAL
  Filled 2011-07-01 (×8): qty 1

## 2011-07-01 MED ORDER — LEVOFLOXACIN 500 MG PO TABS
500.0000 mg | ORAL_TABLET | Freq: Every day | ORAL | Status: DC
  Administered 2011-07-01 – 2011-07-02 (×2): 500 mg via ORAL
  Filled 2011-07-01 (×3): qty 1

## 2011-07-01 MED ORDER — INSULIN ASPART 100 UNIT/ML ~~LOC~~ SOLN
0.0000 [IU] | Freq: Every day | SUBCUTANEOUS | Status: DC
  Administered 2011-07-01 – 2011-07-02 (×2): 2 [IU] via SUBCUTANEOUS

## 2011-07-01 MED ORDER — FAMOTIDINE 20 MG PO TABS
20.0000 mg | ORAL_TABLET | Freq: Every day | ORAL | Status: DC
  Administered 2011-07-01 – 2011-07-12 (×12): 20 mg via ORAL
  Filled 2011-07-01 (×13): qty 1

## 2011-07-01 MED ORDER — POLYETHYLENE GLYCOL 3350 17 G PO PACK
1.0000 | PACK | Freq: Every day | ORAL | Status: DC | PRN
  Filled 2011-07-01: qty 1

## 2011-07-01 MED ORDER — ENOXAPARIN SODIUM 40 MG/0.4ML ~~LOC~~ SOLN: 40.0000 mg | SUBCUTANEOUS | Status: DC

## 2011-07-01 MED ORDER — CLONIDINE HCL 0.2 MG PO TABS
0.4000 mg | ORAL_TABLET | Freq: Every day | ORAL | Status: DC
  Administered 2011-07-01 – 2011-07-11 (×11): 0.4 mg via ORAL
  Filled 2011-07-01 (×12): qty 2

## 2011-07-01 MED ORDER — VITAMIN D3 25 MCG (1000 UNIT) PO TABS
1000.0000 [IU] | ORAL_TABLET | Freq: Every day | ORAL | Status: DC
  Filled 2011-07-01: qty 1

## 2011-07-01 MED ORDER — ENOXAPARIN SODIUM 40 MG/0.4ML ~~LOC~~ SOLN
40.0000 mg | SUBCUTANEOUS | Status: DC
  Administered 2011-07-01 – 2011-07-11 (×11): 40 mg via SUBCUTANEOUS
  Filled 2011-07-01 (×13): qty 0.4

## 2011-07-01 MED ORDER — DOXEPIN HCL 25 MG PO CAPS
25.0000 mg | ORAL_CAPSULE | Freq: Every day | ORAL | Status: DC
  Filled 2011-07-01: qty 1

## 2011-07-01 MED ORDER — ALUM & MAG HYDROXIDE-SIMETH 400-400-40 MG/5ML PO SUSP: 30.0000 mL | ORAL | Status: DC | PRN

## 2011-07-01 MED ORDER — LIDOCAINE 5 % EX PTCH: 1.0000 | MEDICATED_PATCH | CUTANEOUS | Status: DC

## 2011-07-01 MED ORDER — ESCITALOPRAM OXALATE 10 MG PO TABS: 10.0000 mg | ORAL_TABLET | Freq: Every day | ORAL | Status: DC

## 2011-07-01 MED ORDER — METHOTREXATE SODIUM 10 MG PO TABS: 10.0000 mg | ORAL_TABLET | ORAL | Status: DC

## 2011-07-01 MED ORDER — POTASSIUM CHLORIDE CRYS ER 20 MEQ PO TBCR: 40.0000 meq | EXTENDED_RELEASE_TABLET | Freq: Two times a day (BID) | ORAL | Status: DC

## 2011-07-01 MED ORDER — MUPIROCIN CALCIUM 2 % EX CREA
TOPICAL_CREAM | Freq: Every day | CUTANEOUS | Status: DC
  Filled 2011-07-01: qty 15

## 2011-07-01 MED ORDER — BENZONATATE 100 MG PO CAPS
100.0000 mg | ORAL_CAPSULE | Freq: Three times a day (TID) | ORAL | Status: DC | PRN
Start: 2011-07-01 — End: 2011-07-01

## 2011-07-01 MED ORDER — HYDROCODONE-ACETAMINOPHEN 5-325 MG PO TABS: 1.0000 | ORAL_TABLET | ORAL | Status: DC | PRN

## 2011-07-01 MED ORDER — ALLOPURINOL 300 MG PO TABS: 300.0000 mg | ORAL_TABLET | Freq: Two times a day (BID) | ORAL | Status: DC

## 2011-07-01 MED ORDER — METHOTREXATE SODIUM 10 MG PO TABS
10.0000 mg | ORAL_TABLET | ORAL | Status: DC
  Administered 2011-07-03 – 2011-07-11 (×4): 10 mg via ORAL
  Filled 2011-07-01 (×4): qty 1

## 2011-07-01 MED ORDER — ALBUTEROL SULFATE HFA 108 (90 BASE) MCG/ACT IN AERS
1.0000 | INHALATION_SPRAY | RESPIRATORY_TRACT | Status: DC | PRN
  Filled 2011-07-01: qty 6.7

## 2011-07-01 MED ORDER — DOXEPIN HCL 100 MG PO CAPS
100.0000 mg | ORAL_CAPSULE | Freq: Every day | ORAL | Status: DC
Start: 2011-07-01 — End: 2011-07-01

## 2011-07-01 MED ORDER — HYDROXYCHLOROQUINE SULFATE 200 MG PO TABS: 200.0000 mg | ORAL_TABLET | Freq: Two times a day (BID) | ORAL | Status: DC

## 2011-07-01 MED ORDER — INSULIN ASPART 100 UNIT/ML ~~LOC~~ SOLN
0.0000 [IU] | Freq: Three times a day (TID) | SUBCUTANEOUS | Status: DC
  Administered 2011-07-01: 5 [IU] via SUBCUTANEOUS
  Administered 2011-07-02: 3 [IU] via SUBCUTANEOUS
  Administered 2011-07-02 – 2011-07-03 (×2): 2 [IU] via SUBCUTANEOUS
  Administered 2011-07-03: 1 [IU] via SUBCUTANEOUS
  Administered 2011-07-04: 3 [IU] via SUBCUTANEOUS
  Administered 2011-07-04: 2 [IU] via SUBCUTANEOUS
  Administered 2011-07-05: 3 [IU] via SUBCUTANEOUS
  Administered 2011-07-05: 2 [IU] via SUBCUTANEOUS
  Administered 2011-07-05: 1 [IU] via SUBCUTANEOUS
  Administered 2011-07-06 (×2): 2 [IU] via SUBCUTANEOUS
  Administered 2011-07-06: 1 [IU] via SUBCUTANEOUS
  Administered 2011-07-07 – 2011-07-09 (×3): 2 [IU] via SUBCUTANEOUS
  Administered 2011-07-09 – 2011-07-10 (×2): 1 [IU] via SUBCUTANEOUS
  Administered 2011-07-10: 2 [IU] via SUBCUTANEOUS
  Administered 2011-07-11 (×2): 1 [IU] via SUBCUTANEOUS
  Filled 2011-07-01: qty 3

## 2011-07-01 MED ORDER — LORATADINE 10 MG PO TABS
10.0000 mg | ORAL_TABLET | Freq: Every day | ORAL | Status: DC
  Administered 2011-07-02 – 2011-07-12 (×11): 10 mg via ORAL
  Filled 2011-07-01 (×12): qty 1

## 2011-07-01 MED ORDER — ESCITALOPRAM OXALATE 10 MG PO TABS
10.0000 mg | ORAL_TABLET | Freq: Every day | ORAL | Status: DC
  Administered 2011-07-02 – 2011-07-12 (×11): 10 mg via ORAL
  Filled 2011-07-01 (×12): qty 1

## 2011-07-01 MED ORDER — FOLIC ACID 1 MG PO TABS: 1.0000 mg | ORAL_TABLET | Freq: Every day | ORAL | Status: DC

## 2011-07-01 MED ORDER — NYSTATIN 100000 UNIT/GM EX POWD: Freq: Two times a day (BID) | CUTANEOUS | Status: DC

## 2011-07-01 MED ORDER — LORATADINE 10 MG PO TABS
10.0000 mg | ORAL_TABLET | Freq: Every day | ORAL | Status: DC
Start: 2011-07-01 — End: 2011-07-01

## 2011-07-01 MED ORDER — POLYETHYLENE GLYCOL 3350 17 G PO PACK
17.0000 g | PACK | Freq: Every day | ORAL | Status: DC | PRN
  Filled 2011-07-01: qty 1

## 2011-07-01 MED ORDER — PREDNISONE 5 MG PO TABS
5.0000 mg | ORAL_TABLET | Freq: Every day | ORAL | Status: DC
  Filled 2011-07-01: qty 1

## 2011-07-01 MED ORDER — BISACODYL 10 MG RE SUPP: 10.0000 mg | Freq: Every day | RECTAL | Status: DC | PRN

## 2011-07-01 MED ORDER — GUAIFENESIN-DM 100-10 MG/5ML PO SYRP: 5.0000 mL | ORAL_SOLUTION | Freq: Four times a day (QID) | ORAL | Status: DC | PRN

## 2011-07-01 MED ORDER — PNEUMOCOCCAL VAC POLYVALENT 25 MCG/0.5ML IJ INJ
0.5000 mL | INJECTION | INTRAMUSCULAR | Status: AC
  Administered 2011-07-02: 0.5 mL via INTRAMUSCULAR
  Filled 2011-07-01: qty 0.5

## 2011-07-01 MED ORDER — INSULIN GLARGINE 100 UNIT/ML ~~LOC~~ SOLN: 40.0000 [IU] | Freq: Two times a day (BID) | SUBCUTANEOUS | Status: DC

## 2011-07-01 MED ORDER — METFORMIN HCL 500 MG PO TABS: 500.0000 mg | ORAL_TABLET | Freq: Two times a day (BID) | ORAL | Status: DC

## 2011-07-01 MED ORDER — PANTOPRAZOLE SODIUM 40 MG PO TBEC: 40.0000 mg | DELAYED_RELEASE_TABLET | Freq: Every day | ORAL | Status: DC

## 2011-07-01 MED ORDER — POLYETHYLENE GLYCOL 3350 17 G PO PACK: 17.0000 g | PACK | Freq: Every day | ORAL | Status: DC | PRN

## 2011-07-01 MED ORDER — PREGABALIN 50 MG PO CAPS: 100.0000 mg | ORAL_CAPSULE | Freq: Three times a day (TID) | ORAL | Status: DC

## 2011-07-01 MED ORDER — GUAIFENESIN-DM 100-10 MG/5ML PO SYRP
5.0000 mL | ORAL_SOLUTION | Freq: Four times a day (QID) | ORAL | Status: DC | PRN
  Administered 2011-07-01: 10 mL via ORAL
  Filled 2011-07-01: qty 10

## 2011-07-01 MED ORDER — POTASSIUM CHLORIDE CRYS ER 20 MEQ PO TBCR: 20.0000 meq | EXTENDED_RELEASE_TABLET | Freq: Once | ORAL | Status: DC

## 2011-07-01 MED ORDER — PREGABALIN 50 MG PO CAPS
100.0000 mg | ORAL_CAPSULE | Freq: Three times a day (TID) | ORAL | Status: DC
  Administered 2011-07-01 – 2011-07-10 (×26): 100 mg via ORAL
  Filled 2011-07-01: qty 2
  Filled 2011-07-01: qty 1
  Filled 2011-07-01 (×3): qty 2
  Filled 2011-07-01: qty 1
  Filled 2011-07-01 (×3): qty 2
  Filled 2011-07-01 (×3): qty 1
  Filled 2011-07-01: qty 2
  Filled 2011-07-01: qty 1
  Filled 2011-07-01 (×4): qty 2
  Filled 2011-07-01 (×2): qty 1
  Filled 2011-07-01 (×10): qty 2

## 2011-07-01 MED ORDER — DOXEPIN HCL 100 MG PO CAPS: 100.0000 mg | ORAL_CAPSULE | Freq: Every day | ORAL | Status: DC

## 2011-07-01 MED ORDER — DOXEPIN HCL 100 MG PO CAPS
100.0000 mg | ORAL_CAPSULE | Freq: Every day | ORAL | Status: DC
  Administered 2011-07-01 – 2011-07-06 (×6): 100 mg via ORAL
  Filled 2011-07-01 (×7): qty 1

## 2011-07-01 MED ORDER — ALUM & MAG HYDROXIDE-SIMETH 400-400-40 MG/5ML PO SUSP
30.0000 mL | ORAL | Status: DC | PRN
  Administered 2011-07-08: 30 mL via ORAL
  Filled 2011-07-01: qty 30

## 2011-07-01 NOTE — Progress Notes (Signed)
Patient is alert and oriented x3. She has complained of pain and muscle spasms this shift and received meds which provided relief. All wounds were redressed with foam drsg. Patient agreed with safety plan. Oriented patient to rehab and educated patient on safety while in rehab. See current plan of care.

## 2011-07-01 NOTE — Progress Notes (Signed)
Placed PT on cpap at this time with a pressure of 5. PT is tolerating well. RT will continue to monitor.

## 2011-07-01 NOTE — Plan of Care (Signed)
Problem: Consults Goal: Diabetes Guidelines if Diabetic/Glucose > 140 If diabetic or lab glucose is > 140 mg/dl - Initiate Diabetes/Hyperglycemia Guidelines & Document Interventions  Outcome: Progressing Recommendations made.  Will continue to monitor.

## 2011-07-01 NOTE — PMR Pre-admission (Cosign Needed)
PMR Admission Coordinator Pre-Admission Assessment  Patient:  [name] is an 60 y.o., female MRN:  [id number] DOB:  06/23/1951 Height:  Height: 5\' 1"  (154.9 cm) Weight:  Weight: 90.2 kg (198 lb 13.7 oz) S.S.N. SSN-[id number]  Insurance Information:  PRIMARY:Medicaid      [id number]      Subscriber:[name]  Disabled Benefits:  Phone #:[phone]      Eff. Date:verified 07/01/11     Providers:patient's choice  Current Medical History:   Patient Admitting Diagnosis: Rhabdomyolysis and deconditioning in the setting of neuropathy   History of Present Illness: Has DM, neuropathy and B foot ulcers.  Fell 06/27/11.  Noted to have rhabdomyolstis and LLE cellulitis.   Patients Past Medical History:   Past Medical History  Diagnosis Date  . Allergy   . Anxiety   . Depression   . Diabetes mellitus   . GERD (gastroesophageal reflux disease)   . Hyperlipidemia   . Hypertension   . Schizophrenia   . Polypharmacy   . Physical deconditioning   . Barrett esophagus     Barretts Esophagus - EGD 8/07. (Repeat 2-3 years)  . Nephrolithiasis     2008  . Asthma     PFT's (10/14/06)- mild obstructive airway dz, poor effort  . 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 CHF (congestive heart failure)     EF 55% on echo (5/10), repeat ECHO (08/29/09) EF 0000000, Grade 1 Diastolic Dsfxn, Lt atrium mildly dilated, PA systolic pressure mildly elevated.  . Obesity   . PE (pulmonary embolism)     2010  . Gout   . Hypokalemia   . Fibromyalgia   . Vitamin D deficiency   . Steroid dependence   . Peripheral neuropathy   . Back pain   . Anemia    Family Medical History:  family history includes Bone cancer in her mother and COPD in her father.    Height and Weight Height: 5\' 1"  (154.9 cm) Weight: 90.2 kg (198 lb 13.7 oz) Type of Weight: Actual BSA (Calculated - sq m): 1.91 sq meters BMI (Calculated): 35.5  Weight in (lb)  to have BMI = 25: 132   Prior Rehab/Hospitalizations: Was in a SNF 10/07/10 for 30 days  Medications PTA Medications:   Medications Prior to Admission  Medication Dose Route Frequency Provider Last Rate Last Dose  . 0.9 %  sodium chloride infusion   Intravenous Once Abran Richard, [place] 125 mL/hr at 06/27/11 1322    . 0.9 %  sodium chloride infusion   Intravenous STAT Abran Richard, [place] 100 mL/hr at 06/27/11 1723 100 mL/hr at 06/27/11 1723  . albuterol (PROVENTIL HFA;VENTOLIN HFA) 108 (90 BASE) MCG/ACT inhaler 1-2 puff  1-2 puff Inhalation Q4H PRN Dorothy Spark Hensel      . albuterol (PROVENTIL) (5 MG/ML) 0.5% nebulizer solution 2.5 mg  2.5 mg Nebulization Once Sheral [place], MD   2.5 mg at 06/29/11 1440  . albuterol (PROVENTIL) (5 MG/ML) 0.5% nebulizer solution 2.5 mg  2.5 mg Nebulization Q2H PRN Dorothy Spark Hensel      . allopurinol (ZYLOPRIM) tablet 300 mg  300 mg Oral BID Dawn Caviness   300 mg at 07/01/11 1055  . antiseptic oral rinse (BIOTENE) solution 15 mL  15 mL Mouth Rinse q12n4p Dorothy Spark Hensel   15 mL at 06/30/11 1600  . benzonatate (TESSALON) capsule 100 mg  100 mg Oral TID PRN Karen Kays, MD      .  chlorhexidine (PERIDEX) 0.12 % solution 15 mL  15 mL Mouth Rinse BID Dorothy Spark Hensel   15 mL at 07/01/11 0810  . cholecalciferol (VITAMIN D) tablet 1,000 Units  1,000 Units Oral Daily [place], MD      . clindamycin (CLEOCIN) capsule 300 mg  300 mg Oral QID [place], MD   300 mg at 07/01/11 1055  . cloNIDine (CATAPRES) tablet 0.4 mg  0.4 mg Oral QHS Sheral [place], MD      . doxepin [facility]) capsule 100 mg  100 mg Oral QHS Dawn Caviness   100 mg at 06/30/11 2208  . escitalopram (LEXAPRO) tablet 10 mg  10 mg Oral Daily Dawn Caviness   10 mg at 07/01/11 1055  . feeding supplement (GLUCERNA SHAKE) liquid 237 mL  237 mL Oral Q0600 Asencion Partridge, RD   237 mL at 07/01/11 0544  . Fluticasone-Salmeterol (ADVAIR) 500-50 MCG/DOSE inhaler 1 puff  1  puff Inhalation BID Dawn Caviness   1 puff at 07/01/11 0943  . folic acid (FOLVITE) tablet 1 mg  1 mg Oral Daily Dawn Caviness   1 mg at 07/01/11 1055  . heparin injection 5,000 Units  5,000 Units Subcutaneous Q8H Karen Kays, MD   5,000 Units at 07/01/11 0543  . HYDROcodone-acetaminophen (NORCO) 5-325 MG per tablet 1 tablet  1 tablet Oral Q6H PRN Dawn Caviness   1 tablet at 07/01/11 1201  . hydroxychloroquine (PLAQUENIL) tablet 200 mg  200 mg Oral BID Dawn Caviness   200 mg at 07/01/11 1055  . insulin aspart (novoLOG) injection 0-15 Units  0-15 Units Subcutaneous TID WC Dawn Caviness   8 Units at 07/01/11 1223  . insulin aspart (novoLOG) injection 0-5 Units  0-5 Units Subcutaneous QHS Dawn Caviness   4 Units at 06/30/11 2210  . insulin aspart (novoLOG) injection 4 Units  4 Units Subcutaneous TID WC Dawn Caviness   4 Units at 07/01/11 1223  . insulin glargine (LANTUS) injection 40 Units  40 Units Subcutaneous BID Karen Kays, MD   40 Units at 07/01/11 1055  . insulin glargine (LANTUS) injection 40 Units  40 Units Subcutaneous Once Dawn Caviness   40 Units at 06/29/11 2216  . levofloxacin (LEVAQUIN) tablet 750 mg  750 mg Oral Q1500 [place], MD   750 mg at 06/30/11 1609  . lidocaine (LIDODERM) 5 % 1 patch  1 patch Transdermal Q24H Karen Kays, MD   1 patch at 06/30/11 1609  . loratadine (CLARITIN) tablet 10 mg  10 mg Oral Daily Dawn Caviness   10 mg at 07/01/11 1055  . methotrexate (RHEUMATREX) tablet 10 mg  10 mg Oral Custom Dawn Caviness   10 mg at 06/27/11 2319  . morphine 4 MG/ML injection 4 mg  4 mg Intravenous Once Abran Richard, [place]   4 mg at 06/27/11 1205  . mupirocin (BACTROBAN) 2 % cream   Topical Daily Dorothy Spark Hensel      . ondansetron (ZOFRAN) injection 4 mg  4 mg Intravenous Q8H PRN Abran Richard, PA      . pantoprazole (PROTONIX) EC tablet 40 mg  40 mg Oral Daily Dawn Caviness   40 mg at 07/01/11 1055  . polyethylene glycol (MIRALAX / GLYCOLAX) packet 17  g  1 packet Oral Daily PRN Sheral [place], MD      . potassium chloride SA (K-DUR,KLOR-CON) CR tablet 40 mEq  40 mEq Oral Once Dawn Caviness   40 mEq  at 06/28/11 0151  . potassium chloride SA (K-DUR,KLOR-CON) CR tablet 40 mEq  40 mEq Oral BID [place], MD      . predniSONE (DELTASONE) tablet 5 mg  5 mg Oral QAC breakfast [place], MD      . predniSONE (DELTASONE) tablet 60 mg  60 mg Oral NOW Biagio Borg, PHARMD   60 mg at 06/27/11 2318  . pregabalin (LYRICA) capsule 100 mg  100 mg Oral TID Dawn Caviness   100 mg at 07/01/11 1055  . simvastatin (ZOCOR) tablet 10 mg  10 mg Oral q1800 Dawn Caviness   10 mg at 06/30/11 1846  . technetium albumin aggregated (MAA) injection solution 6 milli Curie  6 milli Curie Intravenous Once PRN Medication Radiologist   [place] at 06/28/11 0920  . xenon xe 133 gas 10 milli Curie  10 milli Curie Inhalation Once PRN Medication Radiologist   [place] at 06/28/11 [phone]  . DISCONTD: 0.9 %  sodium chloride infusion   Intravenous Continuous Dawn Caviness 75 mL/hr at 06/30/11 1131    . DISCONTD: albuterol (PROVENTIL HFA;VENTOLIN HFA) 108 (90 BASE) MCG/ACT inhaler 1 puff  1 puff Inhalation Q4H Dawn Caviness   1 puff at 06/27/11 2241  . DISCONTD: albuterol (PROVENTIL) (5 MG/ML) 0.5% nebulizer solution 2.5 mg  2.5 mg Nebulization Q4H PRN Dawn Caviness      . DISCONTD: albuterol (PROVENTIL) (5 MG/ML) 0.5% nebulizer solution 2.5 mg  2.5 mg Nebulization BID [place], MD      . DISCONTD: albuterol (PROVENTIL) (5 MG/ML) 0.5% nebulizer solution 2.5 mg  2.5 mg Nebulization BID Dorothy Spark Hensel      . DISCONTD: benzonatate (TESSALON) capsule 100 mg  100 mg Oral TID [place], MD   100 mg at 07/01/11 1055  . DISCONTD: ciprofloxacin (CIPRO) tablet 750 mg  750 mg Oral BID [place], MD      . DISCONTD: cloNIDine (CATAPRES) tablet 0.2 mg  0.2 mg Oral QHS Dawn Caviness   0.2 mg at 06/30/11 2207  . DISCONTD: cloNIDine (CATAPRES) tablet  0.4 mg  0.4 mg Oral QHS Dawn Caviness   0.4 mg at 06/29/11 2157  . DISCONTD: enoxaparin (LOVENOX) injection 85 mg  1 mg/kg Subcutaneous Q12H Dawn Caviness   85 mg at 06/28/11 0819  . DISCONTD: ferrous sulfate tablet 325 mg  325 mg Oral TID Dawn Caviness   325 mg at 06/27/11 2156  . DISCONTD: insulin glargine (LANTUS) injection 30 Units  30 Units Subcutaneous QHS Dawn Caviness   30 Units at 06/27/11 2211  . DISCONTD: insulin glargine (LANTUS) injection 40 Units  40 Units Subcutaneous QHS Dawn Caviness   40 Units at 06/28/11 2231  . DISCONTD: metFORMIN (GLUCOPHAGE) tablet 500 mg  500 mg Oral BID Dawn Caviness      . DISCONTD: morphine 4 MG/ML injection 4 mg  4 mg Intravenous Q4H PRN Abran Richard, PA   4 mg at 06/29/11 2049  . DISCONTD: polyethylene glycol (MIRALAX / GLYCOLAX) packet 17 g  1 packet Oral Daily PRN Karen Kays, MD      . DISCONTD: polyethylene glycol (MIRALAX / GLYCOLAX) packet 17 g  1 packet Oral Daily [place], MD   17 g at 06/30/11 1008  . DISCONTD: polyethylene glycol (MIRALAX / GLYCOLAX) packet 17 g  1 packet Oral BID [place], MD   17 g at 07/01/11 1055  . DISCONTD: potassium chloride SA (K-DUR,KLOR-CON) CR  tablet 80 mEq  80 mEq Oral TID Dawn Caviness      . DISCONTD: potassium chloride SA (K-DUR,KLOR-CON) CR tablet 80 mEq  80 mEq Oral BID Dawn Caviness   80 mEq at 06/30/11 1009  . DISCONTD: predniSONE (DELTASONE) tablet 5 mg  5 mg Oral Daily Dawn Caviness      . DISCONTD: predniSONE (DELTASONE) tablet 60 mg  60 mg Oral Daily Dawn Caviness      . DISCONTD: predniSONE (DELTASONE) tablet 60 mg  60 mg Oral QAC breakfast Lavonia Drafts [place], PHARMD   60 mg at 07/01/11 0810  . DISCONTD: vancomycin (VANCOCIN) 1,500 mg in sodium chloride 0.9 % 500 mL IVPB  1,500 mg Intravenous Q24H Biagio Borg, PHARMD   1,500 mg at 06/28/11 2313  . DISCONTD: vancomycin (VANCOCIN) IVPB 1000 mg/200 mL premix  1,000 mg Intravenous Q12H [place], PHARMD    1,000 mg at 06/29/11 2048   Medications Prior to Admission  Medication Sig Dispense Refill  . albuterol (PROVENTIL) (5 MG/ML) 0.5% nebulizer solution Take 2.5 mg by nebulization every 4 (four) hours as needed. For wheezing or tightness      . albuterol (VENTOLIN HFA) 108 (90 BASE) MCG/ACT inhaler Inhale 1 puff into the lungs every 4 (four) hours.       [name] Kitchen allopurinol (ZYLOPRIM) 300 MG tablet Take 300 mg by mouth 2 (two) times daily.       . cetirizine (ZYRTEC) 10 MG tablet Take 1 tablet (10 mg total) by mouth daily.  30 tablet  11  . cloNIDine (CATAPRES) 0.2 MG tablet Take 2 tablets (0.4 mg total) by mouth at bedtime.  60 tablet  1  . doxepin (SINEQUAN) 100 MG capsule Take 100 mg by mouth at bedtime.        [name] Kitchen escitalopram (LEXAPRO) 10 MG tablet Take 1 tablet (10 mg total) by mouth daily.  31 tablet  6  . esomeprazole (NEXIUM) 40 MG capsule Take 1 capsule (40 mg total) by mouth 2 (two) times daily.  60 capsule  11  . ferrous sulfate 325 (65 FE) MG tablet Take 325 mg by mouth 3 (three) times daily.       . Fluticasone-Salmeterol (ADVAIR DISKUS) 500-50 MCG/DOSE AEPB Inhale 1 puff into the lungs 2 (two) times daily.  60 each  6  . folic acid (FOLVITE) 1 MG tablet Take 1 mg by mouth daily. Take while on methotrexate      . HYDROcodone-acetaminophen (NORCO) 5-325 MG per tablet Take 1 tablet by mouth every 6 (six) hours as needed for pain. Take 1 tablet by mouth up to 4 times a day as needed for pain  30 tablet  2  . hydroxychloroquine (PLAQUENIL) 200 MG tablet Take 200 mg by mouth 2 (two) times daily.       . metFORMIN (GLUCOPHAGE) 500 MG tablet Take 500 mg by mouth 2 (two) times daily.       . methotrexate 2.5 MG tablet Take 10 mg by mouth as directed. Per Dr. Estrella Deeds pills on Saturday & on sunday      . predniSONE (DELTASONE) 5 MG tablet Take 5 mg by mouth daily.       . pregabalin (LYRICA) 100 MG capsule Take 1 capsule (100 mg total) by mouth 3 (three) times daily.  90 capsule  3  . simvastatin  (ZOCOR) 10 MG tablet Take 1 tablet (10 mg total) by mouth daily.  30 tablet  12   Current Medications: Current facility-administered  medications:albuterol (PROVENTIL HFA;VENTOLIN HFA) 108 (90 BASE) MCG/ACT inhaler 1-2 puff, 1-2 puff, Inhalation, Q4H PRN, Dorothy Spark Hensel;  albuterol (PROVENTIL) (5 MG/ML) 0.5% nebulizer solution 2.5 mg, 2.5 mg, Nebulization, Q2H PRN, Dorothy Spark Hensel;  allopurinol (ZYLOPRIM) tablet 300 mg, 300 mg, Oral, BID, Dawn Caviness, 300 mg at 07/01/11 1055 antiseptic oral rinse (BIOTENE) solution 15 mL, 15 mL, Mouth Rinse, q12n4p, Dorothy Spark Hensel, 15 mL at 06/30/11 1600;  benzonatate (TESSALON) capsule 100 mg, 100 mg, Oral, TID PRN, Sheral [place], MD;  chlorhexidine (PERIDEX) 0.12 % solution 15 mL, 15 mL, Mouth Rinse, BID, Dorothy Spark Hensel, 15 mL at 07/01/11 [id number];  cholecalciferol (VITAMIN D) tablet 1,000 Units, 1,000 Units, Oral, Daily, [place] , MD clindamycin (CLEOCIN) capsule 300 mg, 300 mg, Oral, QID, Sheral [place], MD, 300 mg at 07/01/11 1055;  cloNIDine (CATAPRES) tablet 0.4 mg, 0.4 mg, Oral, QHS, Sheral [place], MD;  doxepin [facility]) capsule 100 mg, 100 mg, Oral, QHS, Dawn Caviness, 100 mg at 06/30/11 2208;  escitalopram (LEXAPRO) tablet 10 mg, 10 mg, Oral, Daily, Dawn Caviness, 10 mg at 07/01/11 1055 feeding supplement (GLUCERNA SHAKE) liquid 237 mL, 237 mL, Oral, Q0600, Asencion Partridge, RD, 237 mL at 07/01/11 0544;  Fluticasone-Salmeterol (ADVAIR) 500-50 MCG/DOSE inhaler 1 puff, 1 puff, Inhalation, BID, Dawn Caviness, 1 puff at [id number];  folic acid (FOLVITE) tablet 1 mg, 1 mg, Oral, Daily, Dawn Caviness, 1 mg at 07/01/11 1055 heparin injection 5,000 Units, 5,000 Units, Subcutaneous, Q8H, [place], MD, 5,000 Units at 07/01/11 0543;  HYDROcodone-acetaminophen ([place]) 5-325 MG per tablet 1 tablet, 1 tablet, Oral, Q6H PRN, Dawn Caviness, 1 tablet at 07/01/11 1201;  hydroxychloroquine (PLAQUENIL) tablet 200 mg, 200 mg, Oral, BID, Dawn  Caviness, 200 mg at 07/01/11 1055 insulin aspart (novoLOG) injection 0-15 Units, 0-15 Units, Subcutaneous, TID WC, Dawn Caviness, 8 Units at 07/01/11 1223;  insulin aspart (novoLOG) injection 0-5 Units, 0-5 Units, Subcutaneous, QHS, Dawn Caviness, 4 Units at 06/30/11 2210;  insulin aspart (novoLOG) injection 4 Units, 4 Units, Subcutaneous, TID WC, Dawn Caviness, 4 Units at 07/01/11 1223 insulin glargine (LANTUS) injection 40 Units, 40 Units, Subcutaneous, BID, [place], MD, 40 Units at 07/01/11 1055;  levofloxacin (LEVAQUIN) tablet 750 mg, 750 mg, Oral, Q1500, [place], MD, 750 mg at 06/30/11 1609;  lidocaine (LIDODERM) 5 % 1 patch, 1 patch, Transdermal, Q24H, [place], MD, 1 patch at 06/30/11 1609;  loratadine (CLARITIN) tablet 10 mg, 10 mg, Oral, Daily, Dawn Caviness, 10 mg at 07/01/11 1055 methotrexate (RHEUMATREX) tablet 10 mg, 10 mg, Oral, Custom, Dawn Caviness, 10 mg at 06/27/11 2319;  mupirocin (BACTROBAN) 2 % cream, , Topical, Daily, Dorothy Spark Hensel;  pantoprazole (PROTONIX) EC tablet 40 mg, 40 mg, Oral, Daily, Dawn Caviness, 40 mg at 07/01/11 1055;  polyethylene glycol (MIRALAX / GLYCOLAX) packet 17 g, 1 packet, Oral, Daily PRN, Sheral [place], MD potassium chloride SA (K-DUR,KLOR-CON) CR tablet 40 mEq, 40 mEq, Oral, BID, Sheral [place], MD;  predniSONE (DELTASONE) tablet 5 mg, 5 mg, Oral, QAC breakfast, Sheral [place], MD;  pregabalin (LYRICA) capsule 100 mg, 100 mg, Oral, TID, Dawn Caviness, 100 mg at 07/01/11 1055;  simvastatin (ZOCOR) tablet 10 mg, 10 mg, Oral, q1800, Dawn Caviness, 10 mg at 06/30/11 1846 DISCONTD: 0.9 %  sodium chloride infusion, , Intravenous, Continuous, Dawn Caviness, Last Rate: 75 mL/hr at 06/30/11 1131;  DISCONTD: albuterol (PROVENTIL) (5 MG/ML) 0.5% nebulizer solution 2.5 mg, 2.5 mg, Nebulization, BID, [place],  MD;  DISCONTD: albuterol (PROVENTIL) (5 MG/ML) 0.5% nebulizer solution 2.5 mg, 2.5 mg, Nebulization, BID, Dorothy Spark  Hensel DISCONTD: benzonatate (TESSALON) capsule 100 mg, 100 mg, Oral, TID, [place], MD, 100 mg at 07/01/11 1055;  DISCONTD: cloNIDine (CATAPRES) tablet 0.2 mg, 0.2 mg, Oral, QHS, Dawn Caviness, 0.2 mg at 06/30/11 2207;  DISCONTD: cloNIDine (CATAPRES) tablet 0.4 mg, 0.4 mg, Oral, QHS, Dawn Caviness, 0.4 mg at 06/29/11 2157 DISCONTD: polyethylene glycol (MIRALAX / GLYCOLAX) packet 17 g, 1 packet, Oral, Daily, [place], MD, 17 g at 06/30/11 1008;  DISCONTD: polyethylene glycol (MIRALAX / GLYCOLAX) packet 17 g, 1 packet, Oral, BID, Sheral [place], MD, 17 g at 07/01/11 1055;  DISCONTD: predniSONE (DELTASONE) tablet 60 mg, 60 mg, Oral, QAC breakfast, Lavonia Drafts [place], PHARMD, 60 mg at 07/01/11 [id number]  Additional Precautions/Restrictions: Precautions Precautions: Fall Restrictions Weight Bearing Restrictions: No  Therapy Assessments Cognition Arousal/Alertness: Awake/alert Overall Cognitive Status: Appears within functional limits for tasks assessed Orientation Level: Oriented X4 Home Living Lives With: Other (Comment) (neice) Receives Help From: Other (Comment) (neice) Type of Home: Mobile home Home Layout: One level Home Access: Stairs to enter Additional Comments: pt unsure of home set up secondary to moving in a few days.   Sensation Light Touch: Impaired by gross assessment Cognition Arousal/Alertness: Awake/alert Overall Cognitive Status: Appears within functional limits for tasks assessed Orientation Level: Oriented X4    Prior Function: Prior Function Level of Independence: Independent with basic ADLs;Independent with gait;Independent with transfers;Requires assistive device for independence;Needs assistance with homemaking Able to Take Stairs?: Yes Driving: No  ADLs/Mobility:    Bed Mobility Bed Mobility: Yes Rolling Right: 6: Modified independent (Device/Increase time) Rolling Left: Not tested (comment) Right Sidelying to Sit: Not tested  (comment) Right Sidelying to Sit Details (indicate cue type and reason): Pt pulling on bil hands assist for BIl LE secondary to pain. C/o pain in R flank increasing with activity. Supine to Sit: 4: Min assist;With rails;HOB elevated (Comment degrees) Transfers Transfers: Yes Sit to Stand: 4: Min assist Sit to Stand Details (indicate cue type and reason): assist for anterior wt shift for improved use of quads to extend knees.   Stand to Sit: 5: Supervision Stand to Sit Details: assist for controlled descent.   Ambulation/Gait Ambulation/Gait: Yes Ambulation/Gait Assistance: 4: Min assist Ambulation/Gait Assistance Details (indicate cue type and reason): Assist at bil UE to stabilize pt, constant cues to address pt sliding left foot during Lt swing phase.   Ambulation Distance (Feet): 50 Feet Assistive device: Rolling walker Gait Pattern: Decreased step length - right;Decreased step length - left;Decreased dorsiflexion - right;Decreased dorsiflexion - left;Shuffle Gait velocity: .51 feet /sec  high falls risk Stairs: No Wheelchair Mobility Wheelchair Mobility: No  Home Assistive Devices/Equipment:  Home Assistive Devices/Equipment Home Assistive Devices/Equipment: CPAP;Cane;CBG Meter;Wheelchair;Walker (specify type)  Discharge Planning:  Discharge Planning Living Arrangements: Other (Comment) (plans to go back home with niece) Support Systems: Family members Do you have any problems obtaining your medications?: No Type of Residence: Other (Comment) (lives in a trailer) [place]: No Patient expects to be discharged to:: home  Case Management Consult Needed: Yes (Comment)  Prior Functional Levels:  Prior Functional Level Bed Mobility: I Transfers: I Mobility - Walk/Wheelchair: I Upper Body Dressing: I Lower Body Dressing: I Grooming: I Eating/Drinking: I Toilet Transfer: I Bladder Continence: Incontinence (wears depends at home) Bowel Management: constipation Stair  Climbing: I Communication: WNL Memory: WNL Cooking/Meal Prep: A Housework: A Money Management: A Driving:  No  Previous Home Environment:  Previous Wellsite geologist: Other (Comment) (plans to go back home with niece) Support Systems: Family members Do you have any problems obtaining your medications?: No Type of Residence: Other (Comment) (lives in a trailer) [place]: No Patient expects to be discharged to:: home   Discharge Living Setting:  Discharge Living Setting Plans for Discharge Living Setting: [place] (Will go home to neice's boyfriend's double wide mobile home) Discharge Living Setting Number of Levels: 1 Discharge Living Setting Number of Steps: 4 Discharge Living Setting is Bedroom on Main Floor?: Yes Discharge Living Setting is Bathroom on Main Floor?: Yes  Social/Family/Support Systems:  Emergency planning/management officer Information: Mady Haagensen (c) [phone] Anticipated Caregiver: neice Anticipated Caregiver's Contact Information: (c) [phone] Ability/Limitations of Caregiver: Saddie Benders works but can check on patient every 1-2 hours Caregiver Availability: Other (Comment) (available every 1-2 hours throughout day) Discharge Plan Discussed with Primary Caregiver: Yes Is Caregiver In Agreement with Plan?: Yes Does Caregiver/Family have Issues with Lodging/Transportation while Pt is in Rehab?: Yes (moving from current mobile home to neice's boyfriend's mobil)  Goals/Additional Needs:  Goals/Additional Needs Patient/Family Goal for Rehab: PT mod I and OT mod I/S (ELOS 2 to 2 1/2 weeks) Cultural Considerations: [employer] Dietary Needs: Diabetic Equipment Needs: TBD Additional Information: Has bilateral foot wounds (Seen by [place] ) Pt/Family Agrees to Admission and willing to participate: Yes Program Orientation Provided & Reviewed with Pt/Caregiver Including Roles  & Responsibilities: Yes  Preadmission Screen Completed By:   Retta Diones, 07/01/2011 1:23 PM  Patient's condition:  This patient's condition remains as documented in the Consult dated 06/30/11, in which the Rehabilitation Physician determined and documented that the patient's condition is appropriate for intensive rehabilitative care in an inpatient rehabilitation facility.  Preadmission Screen Competed [id number] Karl Bales, RN, Time/Date,1329/06/30/12.   Admission Coordinator:  Retta Diones, time1335/Date11/15/12  .

## 2011-07-01 NOTE — Plan of Care (Signed)
Problem: RH SKIN INTEGRITY Goal: RH STG SKIN FREE OF INFECTION/BREAKDOWN Outcome: Progressing Patient will not have any signs/symptoms of wound infection  Problem: RH PAIN MANAGEMENT Goal: RH STG PAIN MANAGED AT OR BELOW PT'S PAIN GOAL Outcome: Progressing Patient will have pain <3.  Problem: RH KNOWLEDGE DEFICIT Goal: RH STG INCREASE KNOWLEDGE OF DIABETES Outcome: Progressing Patient will have increased education of DM

## 2011-07-01 NOTE — Consult Note (Signed)
WOC consult Note Reason for Consult: Consult requested for bilat lower extremities. Wound type: Left foot with partial thickness stasis ulcer.1.5X1.5X.1cm, dark red moist red wound bed.   Bilat plantar feet with chronic full thickness diabetic ulcers. Left outer foot .2X.2X.2cm, dry red wound bed, minimal yellow drainage. Right outer foot .2X.2X.2cm with same appearance and 1X1cm dry dark red callous to outer foot.  These wounds appear r/t constant friction from shoes which are not properly fitted. Recommend pt obtain a prescription for diabetic shoe fitting from primary team.   Dressing procedure/placement/frequency: Foam dressing to left stasis ulcer to promote healing.  Bactroban to provide moist healing to bilat foot wounds.   Will not plan to follow further unless re-consulted.  [address], [place], MSN, [place]

## 2011-07-01 NOTE — H&P (Signed)
Physical Medicine and Rehabilitation Admission [name]  HPI:  [name] is an 60 y.o. 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 Patient with generalized weakness for the past months with increased falls. Noted to have LLE cellulitis and started on antibiotics for treatment. Noted to have rhabdomyolysis with CK @2112  and treated with gentle hydration. BLE dopplers done and negative for DVT. PT evaluation done 11/12 and patient limited by pain. MRI lumbar spine done and Dr. Annette Stable consulted for input. He felt that patient's LLE pain not related to stenosis and no surgical intervention needed. Pain an falls multifactorial likely due to polyneuropathy.   Patient deconditioned and continues to complain of right chest wall pain.  Blood sugars are poorly controlled. WOC consulted for input on wound care and recommends foam dressing and diabetic shoes to prevent friction and worsening of diabetic foot ulcers.  Review of Systems: Eyes: Positive for blurred vision.  Respiratory: Negative.  Cardiovascular: Positive for chest pain (Right chest wall and rib pain.) Chronic LE edema  (improve currently).  Genitourinary: Positive for urgency.  Musculoskeletal: Positive for myalgias and falls (for past couple of months due to LE instability and numbness. Tends to shuffle when walking.).  Skin:  Stasis changes BLE. Erythems left shin. Multiple dressing on BLE.  Neurological: Positive for weakness.  Numbness BLE.     Past Medical History  Diagnosis Date  . Allergy   . Anxiety   . Depression   . Diabetes mellitus   . GERD (gastroesophageal reflux disease)   . Hyperlipidemia   . Hypertension   . Schizophrenia   . Polypharmacy   . Physical deconditioning   . Barrett esophagus     Barretts Esophagus - EGD 8/07. (Repeat 2-3 years)  . Nephrolithiasis     2008  . Asthma      PFT's (10/14/06)- mild obstructive airway dz, poor effort  . 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 CHF (congestive heart failure)     EF 55% on echo (5/10), repeat ECHO (08/29/09) EF 0000000, Grade 1 Diastolic Dsfxn, Lt atrium mildly dilated, PA systolic pressure mildly elevated.  . Obesity   . PE (pulmonary embolism)     2010  . Gout   . Hypokalemia   . Fibromyalgia   . Vitamin D deficiency   . Steroid dependence   . Peripheral neuropathy   . Back pain   . Anemia    Past Surgical History  Procedure Date  . Colonoscopy     benign polyps (12/2000), repeat colonoscopy performed in 2007  . Endometrial biopsy     10/03 benign columnar mucosa (limited material)  . Appendectomy   . Cholecystectomy   . Kidney surgery    Family History  Problem Relation Age of Onset  . Bone cancer Mother   . COPD Father    Social History:   She reports that she quit smoking about 39 years ago. Smoked for about a year as a teenager. She has never used smokeless tobacco. She reports that she does not drink alcohol or use illicit drugs.  Disabled due to schizophrenia (dx @ 87yrs) and never worked out of home.  Allergies  Allergen Reactions  . Benztropine Mesylate     REACTION: Alopecia and white spots on eyes  . Codeine  REACTION: Mouth Swelling - Tachycardia  . Diazepam     REACTION: COMA  . Diphenhydramine Hcl     REACTION: Tachycardia , anxiousness  . Lisinopril     REACTION: cough  . Penicillins     REACTION: ulcers in mouth, swelling, throat swelling  . Sulfonamide Derivatives     REACTION: rash - blisters  . Thioridazine Hcl Other (See Comments)    REACTION: swelling    Medications Prior to Admission  Medication Sig Dispense Refill  . albuterol (PROVENTIL) (5 MG/ML) 0.5% nebulizer solution Take 2.5 mg by nebulization every 4 (four) hours as needed. For wheezing or tightness      . albuterol (VENTOLIN HFA) 108 (90 BASE)  MCG/ACT inhaler Inhale 1 puff into the lungs every 4 (four) hours.       [name] Kitchen allopurinol (ZYLOPRIM) 300 MG tablet Take 300 mg by mouth 2 (two) times daily.       . cetirizine (ZYRTEC) 10 MG tablet Take 1 tablet (10 mg total) by mouth daily.  30 tablet  11  . cloNIDine (CATAPRES) 0.2 MG tablet Take 2 tablets (0.4 mg total) by mouth at bedtime.  60 tablet  1  . doxepin (SINEQUAN) 100 MG capsule Take 100 mg by mouth at bedtime.        [name] Kitchen escitalopram (LEXAPRO) 10 MG tablet Take 1 tablet (10 mg total) by mouth daily.  31 tablet  6  . esomeprazole (NEXIUM) 40 MG capsule Take 1 capsule (40 mg total) by mouth 2 (two) times daily.  60 capsule  11  . ferrous sulfate 325 (65 FE) MG tablet Take 325 mg by mouth 3 (three) times daily.       . Fluticasone-Salmeterol (ADVAIR DISKUS) 500-50 MCG/DOSE AEPB Inhale 1 puff into the lungs 2 (two) times daily.  60 each  6  . folic acid (FOLVITE) 1 MG tablet Take 1 mg by mouth daily. Take while on methotrexate      . HYDROcodone-acetaminophen (NORCO) 5-325 MG per tablet Take 1 tablet by mouth every 6 (six) hours as needed for pain. Take 1 tablet by mouth up to 4 times a day as needed for pain  30 tablet  2  . hydroxychloroquine (PLAQUENIL) 200 MG tablet Take 200 mg by mouth 2 (two) times daily.       . metFORMIN (GLUCOPHAGE) 500 MG tablet Take 500 mg by mouth 2 (two) times daily.       . methotrexate 2.5 MG tablet Take 10 mg by mouth as directed. Per Dr. Estrella Deeds pills on Saturday & on sunday      . predniSONE (DELTASONE) 5 MG tablet Take 5 mg by mouth daily.       . pregabalin (LYRICA) 100 MG capsule Take 1 capsule (100 mg total) by mouth 3 (three) times daily.  90 capsule  3  . simvastatin (ZOCOR) 10 MG tablet Take 1 tablet (10 mg total) by mouth daily.  30 tablet  12    Home:     Functional History:    Functional Status:  Mobility:          ADL:    Cognition: Cognition Orientation Level: Oriented X4 Cognition Orientation Level: Oriented X4   Blood  pressure 124/75, pulse 82, resp. rate 20, height 5' (1.524 m), SpO2 97.00%.  Physical Exam  Constitutional: She appears well-developed and well-nourished.       Appears older than stated age.  HENT:  Head: Normocephalic.  Mouth/Throat: Oropharynx is clear and moist.  Eyes: Pupils are equal, round, and reactive to light.  Neck: Normal range of motion. Erythema present.  Cardiovascular: Normal rate.   Pulmonary/Chest: Effort normal and breath sounds normal.  Abdominal: Soft. Bowel sounds are normal.  Musculoskeletal:       Left knee: She exhibits decreased range of motion, swelling, ecchymosis and erythema.       Legs:      Feet:  Neurological: She is alert. A sensory deficit is present.       Hypersensitivity/ dysesthesias  left shin and bilateral feet to touch.  Weakness LLE. Pain and tenderness left calf to palpation  Skin: Bruising noted.    Dg Chest 2 View  06/30/2011  *RADIOLOGY REPORT*  Clinical Data: Right rib fracture.  Persistent chest pain.  CHEST - 2 VIEW  Comparison: 06/27/2011  Findings:  Mild cardiac enlargement.  No pleural effusion or pulmonary edema.  Coarsened interstitial markings are identified bilaterally.  The lung volumes are decreased.  No focal airspace consolidation identified.  No displaced rib fractures are noted.  IMPRESSION:  1.  No acute cardiopulmonary abnormalities. 2.  Chronic interstitial coarsening.  Original Report Authenticated By: Angelita Ingles, M.D.    Post Admission Physician Evaluation: 1. Functional deficits secondary  to deconditioning: Fall resulting in rhabdomyolysis. Severe diabetic neuropathy. 2. Patient admitted to receive collaborative, interdisciplinary care between the physiatrist, rehab nursing staff, and therapy team. 3. Patient's level of medical complexity and substantial therapy needs in context of that medical necessity cannot be provided at a lesser intensity of care. Patient has experienced substantial functional loss from  his/her baseline. Upon functional assessment at the time of the preadmission screening, patient was Rolling Right: 6: Modified independent (Device/Increase time)  Rolling Left: 6: Modified independent (Device/Increase time)  Right Sidelying to Sit: 4: Min assist  Right Sidelying to Sit Details (indicate cue type and reason): Pt pulling on bil hands assist for BIl LE secondary to pain. C/o pain in R flank increasing with activity.  Transfers  Transfers: Yes  Sit to Stand: 4: Min assist  Sit to Stand Details (indicate cue type and reason): assist for anterior wt shift for improved use of quads to extend knees.  Stand to Sit: 4: Min assist  Stand to Sit Details: assist for controlled descent.  Ambulation/Gait  Ambulation/Gait: Yes  Ambulation/Gait Assistance: 1: +2 Total assist  Ambulation/Gait Assistance Details (indicate cue type and reason): Assist at bil UE to stabilize pt, constant cues to address pt sliding left foot during Lt swing phase.  Ambulation Distance (Feet): 15 Feet  Assistive device: 2 person hand held assist  Gait Pattern: Decreased step length - right;Decreased step length - left;Shuffle  Stairs: No .  Upon most recent functional evaluation, patient was Rolling Right: 6: Modified independent (Device/Increase time)  Rolling Left: Not tested (comment)  Right Sidelying to Sit: Not tested (comment)  Right Sidelying to Sit Details (indicate cue type and reason): Pt pulling on bil hands assist for BIl LE secondary to pain. C/o pain in R flank increasing with activity.  Supine to Sit: 4: Min assist;With rails;HOB elevated (Comment degrees)  Transfers  Transfers: Yes  Sit to Stand: 4: Min assist  Sit to Stand Details (indicate cue type and reason): assist for anterior wt shift for improved use of quads to extend knees.  Stand to Sit: 5: Supervision  Stand to Sit Details: assist for controlled descent.  Ambulation/Gait  Ambulation/Gait: Yes  Ambulation/Gait Assistance: 4: Min  assist  Ambulation/Gait Assistance Details (indicate  cue type and reason): Assist at bil UE to stabilize pt, constant cues to address pt sliding left foot during Lt swing phase.  Ambulation Distance (Feet): 50 Feet  Assistive device: Rolling walker  Gait Pattern: Decreased step length - right;Decreased step length - left;Decreased dorsiflexion - right;Decreased dorsiflexion - left;Shuffle  Gait velocity: .51 feet /sec high falls risk  Stairs: No  Wheelchair Mobility  Wheelchair Mobility: No 4. .  Judging by the patient's diagnosis, physical exam, and functional history, the patient has potential for functional progress which will result in measurable gains while on inpatient rehab.  These gains will be of substantial and practical use upon discharge in facilitating mobility and self-care at the household level. 5. Physiatrist will provide 24 hour management of medical needs as well as oversight of the therapy plan/treatment and provide guidance as appropriate regarding the interaction of the two. 6. 24 hour rehab nursing will assist in the management of  bladder management, bowel management, safety, skin/wound care, disease management, medication administration, pain management and patient education  and help integrate therapy concepts, techniques,education, etc. 7. PT will assess and treat for: balance, endurance, locomotion and strength. Goals are: supervision. 8. OT will assess and treat for: bathing, dressing, feeding, grooming, strength and toileting .  Goals are: supervision.  9. No Speech therapy 10. Case Management and Social Worker will assess and treat for psychological issues and discharge planning. 11. Team conference will be held weekly to assess progress toward goals and to determine barriers to discharge. 12.  Patient will receive at least 3 hours of therapy per day at least 5 days per week. 13. ELOS and Prognosis: 2 weeks good   Medical Problem List and Plan: 1. DVT  Prophylaxis/Anticoagulation: Pharmaceutical: Lovenox 2. Pain Management: prn Vicodin effective. 3. Mood: monitor for now.  Very motivated to get better.  Patient Active Hospital Problem List: 4. Rhabdomyolysis:  Assessment: CK improving but sill elevated.  Off IVF  Plan: Push po fluids.  Continue to hold metformin.  Hold Statin. 5. Diabetes:  Assessment: blood sugars poorly controlled due to stress dose steriods.  Prednisone decreased to home dose today.  Plan: Monitor with AC/HS CBG checks.  Continue lantus bid.  Increase meal coverge for tighter control.  6. HTN:  Assessment:resonal control currently. Ace added due to DM/ for renal protection today.  Plan: Monitor renal status with ARB on board.  Off  lasix and spironolactone currently.  Resume clonidine for schizophrenia. 7.OSA:    Assessment:compliant with CPAP.  Insomnia last night due to nightmares.    Plan: Continue CPAP.  Monitor sleep patterns for now. 8. RA:  Assessment: Symptoms controlled on home regimen.  Plan:  Continue hydroxychloroquine, methotrexate, folic acid and prednisone. 9.  Cellulitis:  Assessment:  Erythema and tenderness improving. IV vancomycin stopped today.  Plan:  Recommendations to continue antibiotic for 14 days.  ?Need for Levaquin AND clindamycin.  Off diuretics which will likely cause increase in peripheral edema with increased activity.  Need to monitor for now. ? Order diabetic shoes. 10. Right chest wall pain  Assessment:  Musculoskeletal in nature.  Chest xray negative for rib fracture.  Plan:  Continue lidocaine patch as this is effective. 11. Hypokalemia:    Assessment:  Resolved with supplement.  Plan:  Will likely not need supplement as diuretics on hold. 12.  Gout:  Assessment: Monitor for symptoms with rhabdomyolysis.  Plan:  Continue allopurinol. 13.  BLE weakness:  Assessment:  Multifactorial due to neuropathy, fibromyalgia, RA and  deconditioning.  Plan:  Physical therapy for  strengthening.  Reports LLE pain due to tightness/spasms. 16.  Vitamin D deficiency:  Assessment:  Low vitamin D levels @ 26. With ongoing weakness. Started on supplement   Plan:  Continue 50,000 units weekly for now. 43.  Asthma/ COPD hx:  Assessment:  Cough and wheezing noted  Plan:  Continue advair.  May need nebs scheduled.  Tessalon helping with cough symptoms. 18.  Constipation:  Assessment:  Multiple stools with Miralax.  Plan:  Decrease to once a day.  Monitor stooling  For C diff symptoms as antibiotics on board. 19. Schzophrenia:  Assessment:  Motivated without signs of agitation, hallucinations or distress.   Plan:  Continue doxepin, lexapro, and clonidine (helps with catatonia)   Alysia Penna E 07/01/2011, 4:48 PM

## 2011-07-01 NOTE — Progress Notes (Signed)
Subjective: [name] reports feeling well this morning, but notes that she had a nightmare last night. She states that her chest pain is "much better" with the lidocaine patch, and states that her cough is much improved with the tessalon. Pt received two doses of Miralax yesterday and reports having 3 bowel movements. Pt ambulated from bed to bathroom using a walker - notes slight improvement in weakness of R leg, but persistent weakness in left leg.   Pt is "thrilled" to be accepted into the inpatient rehab program. Pt's legal guardian [name]) was made aware of this recommendation, and is in agreement with this plan.   Objective: Vital signs in last 24 hours: Temp:  [97.7 F (36.5 C)-99.1 F (37.3 C)] 99.1 F (37.3 C) (11/15 0631) Pulse Rate:  [70-98] 98  (11/15 0631) Resp:  [18-20] 18  (11/15 0631) BP: (100-144)/(59-71) 144/64 mmHg (11/15 0631) SpO2:  [94 %-99 %] 94 % (11/15 0631) Weight:  [198 lb 13.7 oz (90.2 kg)] 198 lb 13.7 oz (90.2 kg) 2023-07-05 2140) Weight change:  Last BM Date: 06/27/11  Physical Exam:  General: Obese, alert, oriented to person and place, cooperative with exam.  HEENT: Normocephalic, atraumatic, EOMI, moist mucous membranes.  Neck: Obese, supple, no JVD, no lymphadenopathy.  Chest: Mildly tender to palpation of anterior R lower ribs (improved from yesterday). L side nontender.  Heart: RRR, S1S2, no murmurs/rubs/gallops.  Lungs: Equal, slightly diminished breath sounds bilaterally, clear to auscultation. No pain with inspiration today.  Abdomen: Obese, bruising improved, RUQ scar s/p cholecystectomy, reducible umbilical hernia, no tenderness to palpation, no guarding, no rebound, bowel sounds present.  Extremities: Improvement of abrasions and bruising of knees b/l. R LE with skin discoloration from chronic venous stasis. L LE with decreased redness and tenderness of calf and pretibial area. Pretibial ulcer present. Various small foot ulcers - wound care  consulted.  Neuro: DTRs 2/4 upper and lower extremities, sensation diminished on L pretibial area, pt able to bend knees and lift legs off bed again this morning (R with greater ROM then L).  Psych: Pt talkative, not tearful or anxious during exam.   Lab Results:  [facility] 07/01/11 0645 2011-07-05 0642  WBC 6.6 8.8  HGB 10.3* 10.3*  HCT 30.4* 31.2*  PLT 146* 144*   BMET  Basename 07/01/11 0645 07/05/11 0642  NA 137 137  K 4.1 5.2*  CL 106 110  CO2 23 21  GLUCOSE 160* 152*  BUN 23 25*  CREATININE 0.82 0.93  CALCIUM 9.4 9.4   25-OH Vitamin D: 26ng/mL (reference range = 30-89) CBGs: 300 overnight, 150-160 this morning   Studies/Results: Dg Chest 2 View Jul 05, 2011  *RADIOLOGY REPORT*  Clinical Data: Right rib fracture.  Persistent chest pain.  CHEST - 2 VIEW  Comparison: 06/27/2011  Findings:  Mild cardiac enlargement.  No pleural effusion or pulmonary edema.  Coarsened interstitial markings are identified bilaterally.  The lung volumes are decreased.  No focal airspace consolidation identified.  No displaced rib fractures are noted.  IMPRESSION:  1.  No acute cardiopulmonary abnormalities. 2.  Chronic interstitial coarsening.  Original Report Authenticated By: [name], M.D.   [name] 06/29/2011 IMPRESSION: At L3-4, there is severe spinal stenosis due to central and right- sided disc protrusion with osteophyte. Expected impingement of the right L3 and right L4 nerve roots.  This is similar to the prior study.  L4-5:  There is grade 1 anterior slip.  Mild spinal stenosis is unchanged.  Interval  development of a prominent superior endplate defect in the L5 vertebral body without associated bone marrow edema.  Original Report Authenticated By: [name], M.D.    Medications: I have reviewed the patient's current medications.  Assessment/Plan: [name] is a 60 y.o. year old obese female with an extensive PMH who lives at home with her niece/legal  guardian [name]. She presented with increasing lower extremity weakness and falls as well as L LE cellulitis. She is being treated for L LE cellulitis and has been seen by neurosurgery to address LE weakness. Physical therapy has recommended CIR, and pt was accepted to inpatient rehab program yesterday.   1. Left leg pain: cellulitis  Erythema, warmth, and tenderness continue to improve. Pt remains afebrile and WBC continues to decline (8.8 yesterday to 6.6 today).  - Continue oral levaquin and clindamycin for a total of 14 days.   2. LE weakness/Falls  MRI showed stable severe stenosis at L3-4 Dr. Annette Stable (neurosurgery) does not recommend surgical intervention - he thinks diabetic autonomic dysfunction is the cause of her weakness. Pt seen by Dr. Naaman Plummer (CIR) yesterday - pt accepted to inpatient rehab. Pt's strength and mobility stable on exam - she is able to lift her legs off the bed and bend her knees (R>L).  - Will plan to transfer pt to inpatient rehab today.  - Continue management of diabetes.   3. Chest pain: right chest wall musculoskeletal pain Pt reports significant improvement of R sided chest pain this morning with use of lidocaine patch. Area less tender to palpation of R anterior ribs today. EKG and CXR (11/14) show no change from previous. Vital signs stable and WNL. Etiology likely musculoskeletal - patient reports falling on that side.  - CXR negative for fracture.   - Will continue lidoderm patch.  4. Elevated CK  Pt with fall prior to admission and unable to get up for 9 hours. CK in elevated (2112) on admission - decreased to 744. Cr has decreased from 1.5 on admission to 0.82 today.  - IVF stopped yesterday - pt with good oral fluid intake.   5. Insulin dependent DM  Pt takes metformin, novolog (20/40/50), and lantus 35 units tid at home. A1C 7.6. CBGs 300 overnight, 140s-150s this morning. Pt with multiple diabetic foot ulcers - wound consult completed. Pt received 29 units  of novolog yesterday (down from 44 the previous day).  - Continue Lantus at 40 bid. Continue mealtime Novolog 4 units tid ac. Continue SSI.  - Continue to hold metformin.  - Monitor CBGs per protocol. - Nursing care for foot ulcers per wound consult recommendations.   6. HTN  Pt on clonidine, HCTZ, and spironolactone at home. Clonidine reduced yesterday from 0.4 qHS to 0.2 qHS yesterday in hopes of weaning off and switching pt to ARB (Cozaar, because it reduces uric acid) for kidney protection due to DM. BPs had been [id number] systolic before switch, last 24 hr BPs = 100-145/60-70. Pt did report a nightmare last night. - Will change clonidine to 0.1 bid and start losartan at 25mg  qAM per pharmacy (Dr. Graylin Shiver) recommendations.  - Continue to hold HCTZ and spironolactone.  - Monitor BP per protocol.   7. CHF  EF 55-60% in 08/2009. Pt taking lasix 80mg  twice daily at home. No edema present on exam, lungs clear to auscultation.  - Will continue to hold lasix and spironolactone for now. Will start ARB. - Continue monitoring clinically for signs of CHF.   8. Chronic steroid  dependence  Pt on 5mg  prednisone daily at home. Overall weakness and energy is improved. Had increased steroids to 60 mg in case of adrenal insufficiency contributing to weakness. I think the improvement in weakness is more due to treatment of cellulitis than the increase in steroids. - Will decrease prednisone to home dose of 5 mg (for RA). Will not need to wean since only on high dose steroids for a few days.   9. Obstructive sleep apnea  Pt uses CPAP for sleep at home. Pt using machine while inpatient and reports sleeping well.  - Will continue CPAP.   10. Rheumatoid arthritis  Per pt, diagnosed in 2011. Pt takes hydroxychloroquine, methotrexate, folic acid, and prednisone 5mg  daily. Currently stable.  - Will continue home medications with increased prednisone dose as per above.   11. Anxiety, depression, hx of  schizophrenia  Pt no longer visibly anxious and tearful. Mood has improved.  - Will continue home medications: doxepin and escitalopram.   12. Fibromyalgia  Currently stable on Lyrica.  - Will continue Lyrica.  13. GERD with Barrett's esophagus  Currently stable on Nexium and Zantac at home.  - Will continue PPI.   14. Hyperlipidemia  Simvastatin discontinued due to elevated CK per pharmacy recommendation  - Will plan to continue simvastatin upon discahrge.   15. History of gout  Currently without acute flare. Pt taking allopurinol for prophylaxis.  - Will continue allopurinol.   16. History of hypokalemia  Pt takes 110mEq KCl tid at home, had been on 26mEq bid as inpatient. Potassium 5.2 yesterday, KCl held. Potassium this morning 4.1. - Will re-start KCl today at 40 mEq bid.  - Will recommending re-checking K tomorrow.  17. History of iron deficiency anemia and now with megaloblastic anemia  Pt takes iron supplementation as outpatient. Hgb stable at 10.3. B12 and folate WNL.  - Iron supplementation has been held due to pt complaint of constipation. Miralax has relieved constipation.  - Will plan to restart Fe supplementation at discharge/transfer.   18. Constipation Pt received Miralax bid yesterday - had 3 bowel movements. Pt continues to denies abdominal discomfort. Abdominal exam benign.  - Will change Miralax from bid to once daily.   19. Asthma  Pt using Advair and albuterol at home. Albuterol neb ordered prn for increased wheezing. Pt did not require neb treatment yesterday. Pt receiving Tessalon scheduled for cough since it worsens right chest wall pain. Cough and chest wall pain have improved. - Will change Tessalon to tid prn.  - Will continue change albuterol to prn.  - Continue Advair.   20. Hypovitaminosis D Pt found to have low vitamin D level of 26. Pt has ongoing weakness of multifactorial etiology.  - Will replete vitamin D (50,000 once/week for 8 weeks, then  daily supplementation).   21. FEN/GI  Pt with improved appetite. Pt seen by nutrition who ordered Glucerna shakes.  - IVF discontinued yesterday.   22. Prophylaxis  DVT: Heparin 5000 tid subq.  GI: Pt on PPI.   23. Disposition  Pt's cellulitis is much improved. LE weakness mostly unchanged.  - CIR has accepted pt, will plan to transfer today if CIR ready to accept pt.   Kathleen Blamer, MS-IV    LOS: 4 days   OH PARK, ANGELA 07/01/2011, 8:35 AM

## 2011-07-01 NOTE — Progress Notes (Signed)
Inpatient Diabetes Program Recommendations  AACE/ADA: New Consensus Statement on Inpatient Glycemic Control (2009)  Target Ranges:  Prepandial:   less than 140 mg/dL      Peak postprandial:   less than 180 mg/dL (1-2 hours)      Critically ill patients:  140 - 180 mg/dL   Reason for Visit: Elevated prandial glucose:  150, 260 mg/dL  Inpatient Diabetes Program Recommendations Insulin - Basal: Agree with increase in Lantus Insulin - Meal Coverage: Please increase meal coverage to Novolog 20 units tid with meals. HgbA1C: Decent glucose control at home- A1C was 7.6% (06/28/11)

## 2011-07-01 NOTE — Progress Notes (Signed)
PT/OT/SLP Cancellation Note  Treatment cancelled today due to pt discharged to [facility]. Gearldean Lomanto DPT [phone] 07/01/2011

## 2011-07-01 NOTE — Discharge Summary (Signed)
I  discussed the care plan with Dr. Verdie Drown and Johny Blamer, MS and the [facility] team and agree with assessment and plan as documented in the discharge note for today.Patrick Jupiter A. Walker Kehr, MD Family Medicine Teaching Service Attending  07/01/2011 4:24 PM

## 2011-07-01 NOTE — Discharge Summary (Signed)
Physician Discharge Summary  Patient ID: [name] MRN: [id number] DOB/AGE: [birth year] 60 y.o.  Admit date: 06/27/2011 Discharge date: 07/01/2011  Admission Diagnoses:  Obesity IDDM CHF (EF 55-60% in 2011) HTN RA (diagnosed 2011) OSA on CPAP Asthma Anxiety Depression Schizophrenia Fibromyalgia GERD w/ Barrett's esophagus HLD Gout Hypokalemia Fe deficient anemia Spinal stenosis (severe L3-4)  Discharge Diagnoses:  L LE cellulitis Hypovitaminosis D Obesity IDDM CHF (EF 55-60% in 2011) HTN RA (diagnosed 2011) OSA on CPAP Asthma Anxiety Depression Schizophrenia Fibromyalgia GERD w/ Barrett's esophagus HLD Gout Hypokalemia Fe deficient anemia Spinal stenosis (severe L3-4)   Discharged Condition: stable  Hospital Course: Pt admitted on 06/27/11 with lower extremity weakness/falls and L LE cellulitis. Pt's cellulitis improved with antibiotic therapy. Pt seen by neurosurgery, PT, and inpatient rehab for LE weakness. She was accepted for CIR.   1. Left leg pain: cellulitis  Erythema, warmth, and tenderness have continued to improve throughout stay. DVT/PE ruled-out with VQ scan (showing low probability of PE) and negative lower extremity dopplers. Pt remains afebrile and WBC continues to decline (12.8 on admission, 6.6 today).  - Continue oral levaquin and clindamycin for a total of 14 days - started on 06/29/11.   2. 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 Neurosurgery consulted - Dr. Annette Stable does not recommend surgical intervention. Repeat MRI showed spinal stenosis unchanged since last year. He believes weakness is more likely due to diabetic autonomic dysfunction. Pt seen and examined by inpatient rehab team, and was accepted to CIR. Pt's strength and mobility have remained stable with slight improvement throughout her hospital course. On today's exam she is able to lift her legs slightly off the  bed and bend her knees (R>L).  - Will transfer pt to inpatient rehab today.   3. Chest pain: right chest wall musculoskeletal pain  On 11/13 pt complained of R sided chest wall pain and tenderness. Etiology likely musculoskeletal - patient reports falling on that side. Repeat EKG and CXR on 11/14 stable without acute changes, including no rib fracture. Lidocaine patch has helped with improvement in pain/tenderness today. Vital signs stable and WNL throughout stay. - Will continue lidoderm patch as needed.    4. Elevated CK  Pt with fall prior to admission and unable to get up for 9 hours. CK in elevated (2112) on admission - decreased to 744 with fluids. Cr has decreased from 1.5 on admission to 0.82 today.   5. Insulin dependent DM  Pt takes metformin, novolog (20/40/50), and lantus 35 units three times per day at home. HgbA1C 7.6 on admission. Her sugars were fairly well controlled on a much reduced dose of her home insulin, 40 units of Lantus bid and 4 units tid Novlog with SSI. Her pharmacist Dr. Valentina Lucks (whom she sees outpatient for management of her diabetes) reports this is due to her diet in the hospital being healthier than as an outpatient.  - Continue Lantus at 40 bid. Continue mealtime Novolog 4 units tid ac. Continue SSI.   6. HTN  Pt on clonidine, HCTZ, and spironolactone at home. HCTZ and spironolactone have been held. Clonidine reduced yesterday from 0.4 qHS to 0.2 qHS yesterday in hopes of weaning off and switching pt to ARB for kidney protection due to DM. The patient reported a nightmare after her clonidine was reduced. This medication may be more to help control her schizophrenia than for anti-hypertensive effects. Since the patient and her guardian are also resistant to  having this medication changed, we will resume her home dose of clonidine. We will refer to her PCP for addition of a low-dose ARB for its renoprotective effects in the setting of her diabetes.  7. CHF  EF 55-60%  in 08/2009. Pt taking lasix 80mg  twice daily at home, but this has been held as inpatient. No edema present on exam, lungs clear to auscultation. Will continue to hold lasix and spironolactone for now while continuing to monitor clinically for signs of CHF.   8. Chronic steroid dependence for rheumatoid arthritis Pt on 5mg  prednisone daily at home. This was increased to 60mg  daily to see if weakness was resulting, in part, from adrenal insufficiency. Overall weakness and energy is somewhat improved. LE weakness mostly unchanged. Decreased prednisone today to home dose of 5 mg (for RA). Will not need to wean since only on high dose steroids for a few days.   9. Obstructive sleep apnea  Pt uses CPAP for sleep at home. Pt using machine while inpatient and reports sleeping well.   10. Rheumatoid arthritis  Per pt, diagnosed in 2011. Pt takes hydroxychloroquine, methotrexate, folic acid, and prednisone 5mg  daily. Currently stable. Home medications to be continued as prescribed.    11. Anxiety, depression, hx of schizophrenia  Pt anxious and tearful on admission. Mood has much improved over the course of her stay. We've continued home medications doxepin and escitalopram and clonidine.  12. Fibromyalgia  Currently stable. Pt has continued on on home dose of Lyrica.   13. GERD with Barrett's esophagus  Currently stable on Nexium and Zantac at home. Pt recieved Protonix while inpatient.    14. Hyperlipidemia  Simvastatin was discontinued due to elevated CK per pharmacy recommendation. Can consider restarting as outpatient.   15. History of gout  Pt remained without acute flare. She continued on allopurinol for prophylaxis.   16. History of hypokalemia  Pt takes 52mEq KCl tid at home, had been on 29mEq bid as inpatient. Potassium 3.2 on admission, 5.2 yesterday - KCl was then held. Potassium this morning 4.1. We've re-started KCl today at 40 mEq bid. - Will recommend re-checking K tomorrow.   17.  History of iron deficiency anemia and now with megaloblastic anemia  Pt takes iron supplementation as outpatient. Hgb stable around 10 while inpatient. B12 and folate WNL. Iron supplementation was held due to pt complaint of constipation. Miralax has now relieved constipation. We will re-start her home iron.  18. Constipation Pt complained of constipation and received received Miralax bid yesterday - had 3 bowel movements. Pt denies abdominal pain and exam benign. Miralax changed today from bid to once daily as needed.   19. Asthma  Pt using Advair and albuterol at home. Pt received Advair as inpatient. Albuterol neb ordered prn for increased wheezing. Pt had one treatment while inpatient, did not require neb treatment yesterday. Pt receiving Tessalon prn for cough since it worsens right chest wall pain. Cough and chest wall pain have improved.   20. Hypovitaminosis D  Pt found to have slightly low vitamin D level of 26. Pt has ongoing weakness of multifactorial etiology.  Pt started on daily vitamin D supplement of 1000 IU.   21. FEN/GI  Pt with improved appetite. Pt seen by nutrition who ordered Glucerna shakes. IVF discontinued due to good oral intake.   22. Prophylaxis  DVT: Heparin 5000 tid subq.  GI: Pt on PPI.   Follow-up issues: If patient's blood pressure tolerates, we recommend starting the patient on  losartan/Cozaar for renal protection due to her history of diabetes and gout.   Consults: rehabilitation medicine (Dr. Naaman Plummer), neurosurgery (Dr. Annette Stable)  Discharge Exam: Blood pressure 110/76, pulse 77, temperature 99.1 F (37.3 C), temperature source Oral, resp. rate 16, height 5\' 1"  (1.549 m), weight 198 lb 13.7 oz (90.2 kg), SpO2 98.00%.  Physical Exam:  General: Obese, alert, oriented to person and place, cooperative with exam.  HEENT: Normocephalic, atraumatic, EOMI, moist mucous membranes.  Neck: Obese, supple, no JVD, no lymphadenopathy.  Chest: Mildly tender to palpation  of anterior R lower ribs (improved from yesterday). L side nontender.  Heart: RRR, S1S2, no murmurs/rubs/gallops.  Lungs: Equal, slightly diminished breath sounds bilaterally, clear to auscultation. No pain with inspiration today.  Abdomen: Obese, bruising improved, RUQ scar s/p cholecystectomy, reducible umbilical hernia, no tenderness to palpation, no guarding, no rebound, bowel sounds present.  Extremities: Improvement of abrasions and bruising of knees b/l. R LE with skin discoloration from chronic venous stasis. L LE with decreased redness and tenderness of calf and pretibial area. Pretibial ulcer present. Various small foot ulcers - wound care consulted.  Neuro: DTRs 2/4 upper and lower extremities, sensation diminished on L pretibial area, pt able to bend knees and lift legs off bed again this morning (R leg with greater ROM than L).  Psych: Pt talkative, not tearful or anxious during exam.   Disposition: Home or Self Care  Discharge Orders    Future Appointments: Provider: Department: Dept Phone: Center:   07/02/2011 9:30 AM Lynne Leader [facility] [phone] [facility]     Current Discharge Medication List    CONTINUE these medications which have NOT CHANGED   Details  albuterol (PROVENTIL) (5 MG/ML) 0.5% nebulizer solution Take 2.5 mg by nebulization every 4 (four) hours as needed. For wheezing or tightness    albuterol (VENTOLIN HFA) 108 (90 BASE) MCG/ACT inhaler Inhale 1 puff into the lungs every 4 (four) hours.     allopurinol (ZYLOPRIM) 300 MG tablet Take 300 mg by mouth 2 (two) times daily.     cetirizine (ZYRTEC) 10 MG tablet Take 1 tablet (10 mg total) by mouth daily. Qty: 30 tablet, Refills: 11    cloNIDine (CATAPRES) 0.2 MG tablet Take 2 tablets (0.4 mg total) by mouth at bedtime. Qty: 60 tablet, Refills: 1    doxepin (SINEQUAN) 100 MG capsule Take 100 mg by mouth at bedtime.      escitalopram (LEXAPRO) 10 MG tablet Take 1 tablet (10 mg total) by mouth  daily. Qty: 31 tablet, Refills: 6    esomeprazole (NEXIUM) 40 MG capsule Take 1 capsule (40 mg total) by mouth 2 (two) times daily. Qty: 60 capsule, Refills: 11    ferrous sulfate 325 (65 FE) MG tablet Take 325 mg by mouth 3 (three) times daily.     Fluticasone-Salmeterol (ADVAIR DISKUS) 500-50 MCG/DOSE AEPB Inhale 1 puff into the lungs 2 (two) times daily. Qty: 60 each, Refills: 6   Associated Diagnoses: Asthma    folic acid (FOLVITE) 1 MG tablet Take 1 mg by mouth daily. Take while on methotrexate    HYDROcodone-acetaminophen (NORCO) 5-325 MG per tablet Take 1 tablet by mouth every 6 (six) hours as needed for pain. Take 1 tablet by mouth up to 4 times a day as needed for pain Qty: 30 tablet, Refills: 2    hydroxychloroquine (PLAQUENIL) 200 MG tablet Take 200 mg by mouth 2 (two) times daily.     metFORMIN (GLUCOPHAGE) 500 MG tablet Take 500 mg  by mouth 2 (two) times daily.     methotrexate 2.5 MG tablet Take 10 mg by mouth as directed. Per Dr. Estrella Deeds pills on Saturday & on sunday    potassium chloride SA (K-DUR,KLOR-CON) 20 MEQ tablet Take 80 mEq by mouth 3 (three) times daily.      predniSONE (DELTASONE) 5 MG tablet Take 5 mg by mouth daily.     pregabalin (LYRICA) 100 MG capsule Take 1 capsule (100 mg total) by mouth 3 (three) times daily. Qty: 90 capsule, Refills: 3    simvastatin (ZOCOR) 10 MG tablet Take 1 tablet (10 mg total) by mouth daily. Qty: 30 tablet, Refills: 12      STOP taking these medications     alendronate (FOSAMAX) 70 MG tablet      bethanechol (URECHOLINE) 5 MG tablet      fluticasone (FLONASE) 50 MCG/ACT nasal spray      furosemide (LASIX) 80 MG tablet      glucose blood (ASCENSIA ELITE TEST STRIPS) test strip      hydrochlorothiazide (HYDRODIURIL) 25 MG tablet      insulin aspart (NOVOLOG) 100 UNIT/ML injection      insulin glargine (LANTUS) 100 UNIT/ML injection      Nebulizers (COMPRESSOR/NEBULIZER) MISC      oxybutynin (DITROPAN) 5  MG tablet      ranitidine (ZANTAC) 300 MG capsule      spironolactone (ALDACTONE) 100 MG tablet      EPINEPHrine (EPIPEN) 0.3 mg/0.3 mL DEVI      Insulin Syringe-Needle U-100 (B-D INS SYR ULTRAFINE 1CC/30G) 30G X 1/2" 1 ML MISC      nystatin (MYCOSTATIN) powder         Ashok Norris [place], MS-IV  Signed: OH PARK, ANGELA 07/01/2011, 1:01 PM

## 2011-07-01 NOTE — Plan of Care (Signed)
Overall Plan of Care [facility]) Patient Details Name: [name] MRN: [id number] DOB: [birth year]  Diagnosis:    Primary Diagnosis:    Physical deconditioning Co-morbidities: diabetes,asthma,copd,mental illness,neuropathy, sleep apnea  Functional Problem List  Patient demonstrates impairments in the following areas: Balance, Bladder, Bowel, Edema, Endurance, Medication Management, Nutrition, Pain, Safety, Sensory , Skin Integrity and Vision  Basic ADL's: bathing, dressing and toileting Advanced ADL's: simple meal preparation  Transfers:  bed mobility, bed to chair, car and furniture Locomotion:  ambulation and stairs  Additional Impairments:  Other  Anticipated Outcomes Item Anticipated Outcome  Eating/Swallowing    Basic self-care  Mod I  Tolieting  Mod I  Bowel/Bladder  Patient will be continent of bowel and bladder.  Transfers  Modified I  Locomotion  Modified I with RW, S on steps  Communication    Cognition    Pain  Patient will have pain <3.  Safety/Judgment  Patient will remain free from injury this shift  Other  Patient's wound will have healing. Patient will have no further skin breakdown.   Therapy Plan:  PT 2-3 times per day 5 days/week  OT 1-2 times per day 5-7 days / week ELOS 7-9 days      Team Interventions: Item RN PT OT SLP SW TR Other  Self Care/Advanced ADL Retraining   x      Neuromuscular Re-Education         Therapeutic Activities  x x   x   UE/LE Strength Training/ROM  x x   x   UE/LE Coordination Activities         Visual/Perceptual Remediation/Compensation   x      DME/Adaptive Equipment Instruction  x x   x   Therapeutic Exercise  x x   x   Balance/Vestibular Training  x x   x   Patient/Family Education x x x   x   Cognitive Remediation/Compensation   x      Functional Mobility Training  x x   x   Ambulation/Gait Training  x       Stair Training  x       Wheelchair Propulsion/Positioning         Functional Set designer Reintegration  x x   x   Dysphagia/Aspiration Environmental consultant         Bladder Management x        Bowel Management x        Disease Management/Prevention x        Pain Management x        Medication Management x        Skin Care/Wound Management x        Splinting/Orthotics  x       Discharge Planning     x    Psychosocial Support     x x                      Team Discharge Planning: Destination:  Home Projected Follow-up:  PT and Home Health Projected Equipment Needs:  none for PT Patient/family involved in discharge planning:  Yes  MD ELOS: 2-3 wks Medical Rehab Prognosis:  Fair Assessment: Chronic severe diabetic neuropathy, 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  See above

## 2011-07-01 NOTE — PMR Pre-admission (Signed)
  Evaluated for possible admission.  Spoke with patient.  She would like inpatient rehab here at [facility] rather than skilled nursing facility placement.  I called and left a message with her niece.  Will contact MD to see if patient medically ready for inpatient rehab today.  Please call me for any questions.  Pager [phone]

## 2011-07-01 NOTE — Telephone Encounter (Signed)
The niece is calling because her aunt is in the hospital.  They are concerned because the hospital staff wants to mess with her meds.  She is inpatient at [facility] # [phone].  They want someone from the Family Medicine Team to make sure her medications are not messed with.

## 2011-07-01 NOTE — PMR Pre-admission (Signed)
  I was able to speak with niece and now can admit to inpatient rehab when okay with MD.  Please call me for any questions.  Pager [phone]

## 2011-07-02 ENCOUNTER — Ambulatory Visit: Payer: Self-pay | Admitting: Family Medicine

## 2011-07-02 DIAGNOSIS — R5381 Other malaise: Secondary | ICD-10-CM

## 2011-07-02 LAB — DIFFERENTIAL
Basophils Absolute: 0 10*3/uL (ref 0.0–0.1)
Basophils Relative: 0 % (ref 0–1)
Eosinophils Absolute: 0.1 10*3/uL (ref 0.0–0.7)
Eosinophils Relative: 1 % (ref 0–5)
Lymphocytes Relative: 18 % (ref 12–46)
Lymphs Abs: 1.6 10*3/uL (ref 0.7–4.0)
Monocytes Absolute: 0.6 10*3/uL (ref 0.1–1.0)
Monocytes Relative: 6 % (ref 3–12)
Neutro Abs: 7 10*3/uL (ref 1.7–7.7)
Neutrophils Relative %: 75 % (ref 43–77)

## 2011-07-02 LAB — GLUCOSE, CAPILLARY
Glucose-Capillary: 138 mg/dL — ABNORMAL HIGH (ref 70–99)
Glucose-Capillary: 197 mg/dL — ABNORMAL HIGH (ref 70–99)
Glucose-Capillary: 205 mg/dL — ABNORMAL HIGH (ref 70–99)
Glucose-Capillary: 216 mg/dL — ABNORMAL HIGH (ref 70–99)

## 2011-07-02 LAB — COMPREHENSIVE METABOLIC PANEL
ALT: 24 U/L (ref 0–35)
AST: 15 U/L (ref 0–37)
Albumin: 2.4 g/dL — ABNORMAL LOW (ref 3.5–5.2)
Alkaline Phosphatase: 93 U/L (ref 39–117)
BUN: 21 mg/dL (ref 6–23)
CO2: 25 mEq/L (ref 19–32)
Calcium: 9.4 mg/dL (ref 8.4–10.5)
Chloride: 108 mEq/L (ref 96–112)
Creatinine, Ser: 0.94 mg/dL (ref 0.50–1.10)
GFR calc Af Amer: 75 mL/min — ABNORMAL LOW (ref >90)
GFR calc non Af Amer: 65 mL/min — ABNORMAL LOW (ref >90)
Glucose, Bld: 145 mg/dL — ABNORMAL HIGH (ref 70–99)
Potassium: 4.2 mEq/L (ref 3.5–5.1)
Sodium: 139 mEq/L (ref 135–145)
Total Bilirubin: 0.3 mg/dL (ref 0.3–1.2)
Total Protein: 5.8 g/dL — ABNORMAL LOW (ref 6.0–8.3)

## 2011-07-02 LAB — URINALYSIS, ROUTINE W REFLEX MICROSCOPIC
Bilirubin Urine: NEGATIVE
Glucose, UA: 250 mg/dL — AB
Hgb urine dipstick: NEGATIVE
Ketones, ur: NEGATIVE mg/dL
Leukocytes, UA: NEGATIVE
Nitrite: NEGATIVE
Protein, ur: NEGATIVE mg/dL
Specific Gravity, Urine: 1.02 (ref 1.005–1.030)
Urobilinogen, UA: 1 mg/dL (ref 0.0–1.0)
pH: 6 (ref 5.0–8.0)

## 2011-07-02 LAB — CBC
HCT: 31.2 % — ABNORMAL LOW (ref 36.0–46.0)
Hemoglobin: 10.5 g/dL — ABNORMAL LOW (ref 12.0–15.0)
MCH: 35.2 pg — ABNORMAL HIGH (ref 26.0–34.0)
MCHC: 33.7 g/dL (ref 30.0–36.0)
MCV: 104.7 fL — ABNORMAL HIGH (ref 78.0–100.0)
Platelets: 142 10*3/uL — ABNORMAL LOW (ref 150–400)
RBC: 2.98 MIL/uL — ABNORMAL LOW (ref 3.87–5.11)
RDW: 14.7 % (ref 11.5–15.5)
WBC: 9.4 10*3/uL (ref 4.0–10.5)

## 2011-07-02 MED ORDER — INSULIN GLARGINE 100 UNIT/ML ~~LOC~~ SOLN
20.0000 [IU] | Freq: Every day | SUBCUTANEOUS | Status: DC
  Administered 2011-07-02 – 2011-07-12 (×11): 20 [IU] via SUBCUTANEOUS
  Filled 2011-07-02 (×2): qty 3

## 2011-07-02 NOTE — Plan of Care (Signed)
Problem: RH SKIN INTEGRITY Goal: RH STG SKIN FREE OF INFECTION/BREAKDOWN Outcome: Progressing Skin wounds have no signs of infection  Problem: RH PAIN MANAGEMENT Goal: RH STG PAIN MANAGED AT OR BELOW PT'S PAIN GOAL Outcome: Progressing Patient continues to have pain. Pain goal<3.

## 2011-07-02 NOTE — Progress Notes (Deleted)
Occupational Therapy Assessment and Plan  Patient Details  Name: Constance Beichner MRN: [id number] Date of Birth: [birth year]  OT Diagnosis: muscle weakness (generalized) Rehab Potential:  good for goals ELOS: 7-10 days 0930 - 1032 15 min eval 47 min treat     Assessment & Plan  Patient transferred to [place] on 07/01/2011 .  Lavonna Valerie is an 60 y.o. 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 Patient with generalized weakness for the past months with increased falls. Noted to have LLE cellulitis and started on antibiotics for treatment. Noted to have rhabdomyolysis with CK @2112  and treated with gentle hydration. BLE dopplers done and negative for DVT. PT evaluation done 11/12 and patient limited by pain. MRI lumbar spine done and Dr. Annette Stable consulted for input. He felt that patient's LLE pain not related to stenosis and no surgical intervention needed. 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 Pain and falls multifactorial likely due to polyneuropathy. PTA, pt lived with niece who works during the day, but comes home to check on her intermittently. Pt states she was independent with all ADL and mobility and completed basic IADL - simple meal prep and light cleaning and laundry. Pt does not drive. Upon D/C, pt will live in a mobile home. Pt has not lived in this home PTA. It has 4 STE. Pt has a very close relationship with her niece and has recently lost her mother this past May.  Pt presents to CIR requiring Mod A with LB ADL and Min A with UB ADL. Mod A with tranfers secondary to muscle weakness and muscle joint tightness.  Prior to hospitalization, patient could complete ADL and IADL independently, however, has been having more falls .  Patient will benefit from skilled intervention to increase independence with basic self-care skills and  increase level of independence with iADL prior to discharge home with care partner.  Anticipate patient will require intermittent supervision and follow up home health.   Precautions/Restrictions  - fall   Pain Pain Assessment Pain Assessment: No/denies pain Pain Score: 0-No pain Home Living/Prior Functioning - I * see navigator for information on all headings below:   ADL   Vision/Perception     Cognition   Sensation   Motor    Mobility     Trunk/Postural Assessment     Balance   Extremity/Trunk Assessment      Recommendations for other services: none  Discharge Criteria: Patient will be discharged from OT if patient refuses treatment 3 consecutive times without medical reason, if treatment goals not met, if there is a change in medical status, if patient makes no progress towards goals or if patient is discharged from hospital.  The above assessment, treatment plan, treatment alternatives and goals were discussed and mutually agreed upon by patient. Family not present.  Skilled Intervention: Pt seen for ADL retraining at [place] with focus on endurance, strength and mobility to increase indep with ADL. Will assess use of long handled sponeg and LB AE to increase indep with ADL  [facility] 07/02/2011, 9:41 AM

## 2011-07-02 NOTE — Progress Notes (Addendum)
Physical Therapy Note  Patient Details  Name: [name] MRN: [id number] Date of Birth: 1951-05-19 Today's Date: 07/02/2011  Treatment time 1305-1405 Individual therapy  Pain: denies pain, state earlier meds relieved discomfort in LE's Skilled intervention: BLE therapeutic exercise  to increase strength and muscle endurance for mobility- seated LAQ's, supine glut sets, heel slides, abduction, unable to perfom SAQ (A,/AROM- A/ROM), bridging but only able to pelvic tilt all 10-15 reps, repeated sit to stands with BUE use x 10; pt required verbal instruction for breathing.  Transfers stand pivot with RW min A w/c to/from mat and to/from [facility].  Increased effort and difficulty with fatigue noted.  Othelia Pulling 07/02/2011, 2:23 PM

## 2011-07-02 NOTE — Progress Notes (Signed)
Occupational Therapy Note  Patient Details  Name: [name] MRN: [id number] Date of Birth: [birth year] Today's Date: 07/02/2011 1402 - 1432 30 min treat  Skilled Intervention: Focus on BUE strengthening. ADL retraining with introduction of AE - sock aid and reacher. Issued walker bag to begin to use with ADL tasks . Requires mod A to use. minA with transfers. Excellent participation. Reviewed POC and goals with pt.  Individual session   Kra[place] 07/02/2011, 2:29 PM

## 2011-07-02 NOTE — Progress Notes (Signed)
Patient information reviewed and entered into [facility] system by Andra Heslin, RN, CRRN, PPS Coordinator.  Information including medical coding and functional independence measure will be reviewed and updated through discharge.    

## 2011-07-02 NOTE — Progress Notes (Addendum)
Physical Therapy Assessment and Plan and Treatment note  Patient Details  Name: [name] MRN: [id number] Date of Birth: Oct 05, 1950  PT Diagnosis: Abnormal posture, Difficulty walking and Muscle weakness Rehab Potential: Good ELOS: 7-9 days  10:32-11:36 Time in/out  Assessment & Plan Clinical Impression:HPI:  [name] is an 60 y.o. 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 Patient with generalized weakness for the past months with increased falls. Noted to have LLE cellulitis and started on antibiotics for treatment. Noted to have rhabdomyolysis with CK @2112  and treated with gentle hydration. BLE dopplers done and negative for DVT. PT evaluation done 11/12 and patient limited by pain. MRI lumbar spine done and Dr. Annette Stable consulted for input. He felt that patient's LLE pain not related to stenosis and no surgical intervention needed. Pain an falls multifactorial likely due to polyneuropathy.  Patient deconditioned and continues to complain of right chest wall pain. Blood sugars are poorly controlled. WOC consulted for input on wound care and recommends foam dressing and diabetic shoes to prevent friction and worsening of diabetic foot ulcers.    60 y.o. year old female  patient transferred to [place] on 07/01/2011 .  Patient's past medical history is significant for schizophrenia, B TKR, RA, CVA with L residual weakness, CHF, DM, depression, anxiety, polyneuropathy, decreased vision, HTN, COPD, obesity, hx PE.    Patient currently requires mod with mobility secondary to muscle weakness and sensory and visual deficits and decreased standing balance, decreased postural control and decreased balance strategies, and impaired activity tolerance.  Prior to hospitalization, patient was I but had frequent falls with mobility and lived with Other (Comment) (niece)  in a   home.  Home access is  Stairs to enter;Other (comment) (4). Pt just now moving to nieces' boyfriends mobile home, unsure of rails on steps.  Patient will benefit from skilled PT intervention to maximize safe functional mobility, minimize fall risk and educate on safe use of RW for planned discharge home with intermittent assist.  Anticipate patient will benefit from follow up [facility] at discharge.  PT - End of Session Endurance Deficit: Yes Endurance Deficit Description: requires rest breaks PT Assessment Rehab Potential: Good Barriers to Discharge: None PT Plan PT Frequency: 2-3 X/day, 60-90 minutes Estimated Length of Stay: 7-9 days PT Treatment/Interventions: Ambulation/gait training;Stair training;Balance/vestibular training;Functional mobility training;Therapeutic Activities;Therapeutic Exercise;Community reintegration;UE/LE Strength taining/ROM;DME/adaptive equipment instruction;Patient/family education;Pain management  Precautions/Restrictions Precautions Precautions: Fall Required Braces or Orthoses: Yes (diabetic shoes have been ordered per pt) Restrictions Weight Bearing Restrictions: No   Vital Signs Therapy Vitals Pulse Rate: 83  (with activity) BP: 136/65 mmHg Oxygen Therapy SpO2: 100 % (with activity, dypsnea 2/4) O2 Device: None (Room air)  Pain no pain at beginning, c/o moderate pain in ulcers LE at end and requested meds from [place] Lives With: Other (Comment) (niece) Receives Help From:  (niece) Home Access: Stairs to enter;Other (comment) (4) Bathroom Shower/Tub: Chiropodist: Standard Home Adaptive Equipment: Walker - rolling Prior Function Level of Independence: Other (comment) (frequent falls, did not use RW d/t "pride") Able to Take Stairs?: Yes (with difficulty d/RA, prefers ramp) Driving: No Vocation: On disability Leisure: Hobbies-yes (Comment) Comments: flowers Vision/Perception    Vision - History Baseline Vision: Other (comment) (poor vision due to diabetes.) Visual History: Retinopathy (per pt) Patient Visual Report: No change from  baseline Vision - Assessment Eye Alignment: Within Functional Limits Vision Assessment: Vision not tested Additional Comments: reports significant deficits, does not have glasses here Perception Perception:  (depth perception impaired) Praxis Praxis: Intact  Cognition Overall Cognitive Status: Impaired at baseline Arousal/Alertness: Awake/alert Orientation Level: Oriented X4 Attention: Alternating Alternating Attention: Appears intact Memory: Appears intact Awareness: Appears intact Problem Solving: Appears intact Executive Function:  (appears WFL for basic ADL) Safety/Judgment: Appears intact Sensation Sensation Light Touch: Impaired Detail Light Touch Impaired Details: Absent LLE;Impaired RLE (L absent mid shin to foot) Stereognosis: Appears Intact Hot/Cold: Appears Intact Proprioception: Appears Intact Additional Comments: c/o occasional numbness/tingling B hands which causes her to "dro stuff" Coordination Gross Motor Movements are Fluid and Coordinated: Yes Fine Motor Movements are Fluid and Coordinated: Yes Motor  Motor Motor: Abnormal postural alignment and control;Other (comment) Motor - Skilled Clinical Observations: difficulty trying to bend knees, appeared not to be related to strenght or ROM and had been noted on acute  Mobility Bed Mobility Bed Mobility: Yes Rolling Right: 6: Modified independent (Device/Increase time) Right Sidelying to Sit: 6: Modified independent (Device/Increase time) Supine to Sit: 4: Min assist Supine to Sit Details: Verbal cues for technique;Manual facilitation for weight shifting Sit to Supine - Right: 3: Mod assist (A to lift both legs) Transfers Transfers: Yes Sit to Stand: From bed;Other (comment) (tub bench) Stand to Sit: 3: Mod assist Stand to Sit Details (indicate cue  type and reason): Tactile cues for posture;Verbal cues for technique;Verbal cues for sequencing;Visual cues/gestures for precautions/safety;Visual cues/gestures for sequencing;Verbal cues for safe use of DME/AE Stand Pivot Transfers: 4: Min assist Stand Pivot Transfer Details: Verbal cues for technique;Tactile cues for posture Locomotion  Ambulation Ambulation/Gait Assistance: 3: Mod assist Ambulation Distance (Feet): 2 Feet Ambulation/Gait Assistance Details (indicate cue type and reason): decreased step lenght, decreased stance time on L but excessive double stance time Gait Gait velocity: .1 ft/sec (slower than on acute, may be related to fatigue, with RW) Stairs / Additional Locomotion Stairs: Yes Stairs Assistance: 3: Mod assist Stairs Assistance Details (indicate cue type and reason): assist to raise LLE onto step and for balance, pt VERY fearful Stair Management Technique: Two rails;Step to pattern Number of Stairs: 5  Height of Stairs: 6  Wheelchair Mobility Wheelchair Mobility: Yes Wheelchair Assistance: 4: Energy manager: Both upper extremities Wheelchair Parts Management: Needs assistance Distance: 40', c/o UE pain d/t RA, instructed not to concentrate on w/c propulsion d/t concerns for UE preservation and need for RW for safety with gait  Trunk/Postural Assessment  Thoracic Assessment Thoracic Assessment: Exceptions to [facility] (kyphotic) Lumbar Assessment Lumbar Assessment: Exceptions to [facility] (unable to reach feet. able to do PTA) Postural Control Postural Control: Deficits on evaluation Righting Reactions: impaired hip, ankle and stepping strategy  Balance Balance Balance Assessed: Yes Static Sitting Balance Static Sitting - Level of Assistance: 7: Independent Dynamic Standing Balance Dynamic Standing - Balance Support: No upper extremity supported Dynamic Standing - Level of Assistance: 3: Mod assist Dynamic Standing - Comments: , with 1 UE support on  RW pt able to perfom minimal weight shifts (2" reach) with close S Extremity Assessment  RUE Assessment RUE Assessment: Exceptions to [facility] RUE Strength RUE Overall Strength: Deficits (@ 4/5 throughout) LUE Assessment LUE Assessment: Exceptions to [facility] (4/5 throughout) RLE Assessment RLE Assessment: Exceptions to [facility] RLE Strength RLE Overall Strength: Deficits RLE Overall Strength Comments: unable to move ankle in DF/PF but full P/ROM, ? motor control deficit? knee and hip 3+/5 but pt  reports weak d/t lying on R side after fall   Decreased muscle endurance, hip flexor strength 3/5 but hip weaker 2-, 2/5, knee lacks full extension d/t prior TKR LLE Assessment LLE Assessment: Exceptions to [place] LLE Strength LLE Overall Strength Comments: weak from prior CVA, hip 3-/5, knee 3/5 , ankle same as R except passive DF to neutral only  decreased muscle endurance, hip flexor 3-/5 but extensors and abductors 2/5, lacks full knee extension d/t prior TKR,  Recommendations for other services: Other: orthotist for diabetic shoes  Discharge Criteria: Patient will be discharged from PT if patient refuses treatment 3 consecutive times without medical reason, if treatment goals not met, if there is a change in medical status, if patient makes no progress towards goals or if patient is discharged from hospital.  The above assessment, treatment plan, treatment alternatives and goals were discussed and mutually agreed upon:  By patient  Skilled treatment initiated : instruction on safe use of RW with gait and transfers- with verbal instruction and subtle cues after that for hand placement with transfers.  All mobility required increased time, patient fearful but this is improved with UE support in standing.  Gait training with RW 18, then only able to go 7' next trial d/t reports of knees feeling as if they were going to buckle.  LLE weaker than R premorbidly but patient reports she has lost most strength on R from  lying on that side after fall. Decreased weight bearing noted on L with sit to stands. Patient verbalizes understanding of their increased fall risk and agrees to use assistive device for mobility as instructed until told otherwise by follow up PT due to severe neuropathy, decreased vision, and history of falls and fear.  Othelia Pulling 07/02/2011, 12:07 PM

## 2011-07-02 NOTE — Progress Notes (Signed)
Subjective/Complaints: No new complaints.  Slept very well.   No pain, SOB or CP  Objective: Vital Signs: Blood pressure 118/74, pulse 73, temperature 97.3 F (36.3 C), temperature source Oral, resp. rate 18, height 5' (1.524 m), weight 90 kg (198 lb 6.6 oz), SpO2 99.00%. Dg Chest 2 View  06/30/2011  *RADIOLOGY REPORT*  Clinical Data: Right rib fracture.  Persistent chest pain.  CHEST - 2 VIEW  Comparison: 06/27/2011  Findings:  Mild cardiac enlargement.  No pleural effusion or pulmonary edema.  Coarsened interstitial markings are identified bilaterally.  The lung volumes are decreased.  No focal airspace consolidation identified.  No displaced rib fractures are noted.  IMPRESSION:  1.  No acute cardiopulmonary abnormalities. 2.  Chronic interstitial coarsening.  Original Report Authenticated By: Angelita Ingles, M.D.    Basename 07/02/11 0705 07/01/11 1728  WBC 9.4 9.0  HGB 10.5* 12.1  HCT 31.2* 35.0*  PLT 142* 170    Basename 07/01/11 1728 07/01/11 0645 06/30/11 0642  NA -- 137 137  K -- 4.1 5.2*  CL -- 106 110  CO2 -- 23 21  GLUCOSE -- 160* 152*  BUN -- 23 25*  CREATININE 0.86 0.82 --  CALCIUM -- 9.4 9.4    Physical Exam: General appearance: alert, cooperative and appears older than stated age Resp: clear to auscultation bilaterally Cardio: regular rate and rhythm GI: soft, non-tender; bowel sounds normal; no masses,  no organomegaly Extremities: Stasis changed BLE.  Cellulitis LLE improving with dry dressing on ulcer.  Continues with sensitivity         Left calf>left shin and bilateral feet. Skin: diabetic ulcers bilateral feet due to pressure and left shin Neurologic: alert and oriented X 3, LLE weakness.  Neuropathy BLE.   Assessment/Plan:  1. Functional deficits secondary to Fall, rhabdomyolysis, severe diabetic neuropathy which require 3+ hours per day of interdisciplinary therapy in a comprehensive inpatient rehab setting. Physiatrist is providing close team  supervision and 24 hour management of active medical problems listed below. Physiatrist and rehab team continue to assess barriers to discharge/monitor patient progress toward functional and medical goals. Mobility:         ADL:   Cognition: Cognition Orientation Level: Oriented X4 Cognition Orientation Level: Oriented X4      Medical Problem List and Plan:  1. DVT Prophylaxis/Anticoagulation: Pharmaceutical: Lovenox  2. Pain Management: prn Vicodin effective. Robaxin helpful for spasms 3. Mood: monitor for now. Very motivated to get better.   Patient Active Hospital Problem List:  4. Rhabdomyolysis:  Assessment: CK improving recheck levels next week.  Renal status normalized today Plan:  Continue to push po fluids. Resume metformin.   5. Diabetes:  Assessment: blood sugars poorly controlled due to stress dose steriods. Prednisone decreased to home dose today.  Plan: Monitor with AC/HS CBG checks. Continue lantus bid. Increase meal coverge for tighter control.  Resume metformin. 6. HTN:  Assessment:resonalble control currently. Ace added due to DM/ for renal protection today.  Plan: Monitor renal status with ARB on board. Off lasix and spironolactone currently. Resume clonidine for schizophrenia.  7.OSA:  Assessment:compliant with CPAP. Slept better last night.  Plan: Continue CPAP. Monitor sleep patterns for now.  8. RA:  Assessment: Symptoms controlled on home regimen.  Plan: Continue hydroxychloroquine, methotrexate, folic acid and prednisone.  9. Cellulitis:  Assessment: Erythema and tenderness improving. IV vancomycin stopped today.  Plan: Recommendations to continue antibiotic for 14 days. ?Need for Levaquin AND clindamycin. Off diuretics which will likely cause increase  in peripheral edema with increased activity. Need to monitor for now. ? Order diabetic shoes.  10. Right chest wall pain Assessment: Musculoskeletal in nature. Chest xray negative for rib fracture.    Plan: Continue lidocaine patch as this is effective.  11. Hypokalemia:  Assessment: Resolved with supplement.  Plan: Will hold supplement for now.  12. Gout:  Assessment: Monitor for symptoms with rhabdomyolysis.  Plan: Continue allopurinol.  13. BLE weakness:  Assessment: Multifactorial due to neuropathy, fibromyalgia, RA and deconditioning. ? Statin related.  Continue to hold statin and monitor. Plan: Physical therapy for strengthening. Reports LLE pain due to tightness/spasms.  16. Vitamin D deficiency:  Assessment: Low vitamin D levels @ 26. With ongoing weakness. Started on supplement  Plan: Continue 50,000 units weekly for now.  7. Asthma/ COPD hx:  Assessment: Cough.  Decreased wheezing  Plan: Continue advair. May need nebs scheduled. Tessalon helping with cough symptoms.  18. Constipation:  Assessment: Multiple stools with Miralax.  Plan: Decrease to once a day. Monitor stooling For C diff symptoms as antibiotics on board.  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  Plan: Continue doxepin, lexapro, and clonidine (helps with catatonia)  11.  Anemia: Plan: H and H with drop today.  Will check stool for occult blood as has been on increased dose steroids. Continue iron.     Bary Leriche 07/02/2011, 8:24 AM

## 2011-07-02 NOTE — Progress Notes (Signed)
Social Work Assessment and Plan Assessment and Plan  Patient Name: [name]  Today's Date: 07/02/2011  Problem List:  Patient Active Problem List  Diagnoses  . DIABETES MELLITUS, TYPE I, UNCONTROLLED  . DIABETIC PERIPHERAL NEUROPATHY  . HYPERCHOLESTEROLEMIA  . Gout, unspecified  . HYPOKALEMIA  . OBESITY, NOS  . ANEMIA  . SCHIZOPHRENIA  . DEPRESSIVE DISORDER, NOS  . HYPERTENSION, BENIGN SYSTEMIC  . PULMONARY EMBOLISM  . DIASTOLIC HEART FAILURE, CHRONIC  . VENOUS INSUFFICIENCY, CHRONIC  . RHINITIS, ALLERGIC  . ASTHMA, PERSISTENT  . Reflux esophagitis  . OSTEOARTHRITIS, MULTI SITES  . BACK PAIN WITH RADICULOPATHY  . SLEEP APNEA  . INCONTINENCE, URGE  . Polypharmacy  . Diabetic foot ulcer  . Physical deconditioning  . Weakness    Past Medical History:  Past Medical History  Diagnosis Date  . Allergy   . Anxiety   . Depression   . Diabetes mellitus   . GERD (gastroesophageal reflux disease)   . Hyperlipidemia   . Hypertension   . Schizophrenia   . Polypharmacy   . Physical deconditioning   . Barrett esophagus     Barretts Esophagus - EGD 8/07. (Repeat 2-3 years)  . Nephrolithiasis     2008  . Asthma     PFT's (10/14/06)- mild obstructive airway dz, poor effort  . 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 CHF (congestive heart failure)     EF 55% on echo (5/10), repeat ECHO (08/29/09) EF 0000000, Grade 1 Diastolic Dsfxn, Lt atrium mildly dilated, PA systolic pressure mildly elevated.  . Obesity   . PE (pulmonary embolism)     2010  . Gout   . Hypokalemia   . Fibromyalgia   . Vitamin D deficiency   . Steroid dependence   . Peripheral neuropathy   . Back pain   . Anemia     Past Surgical History:  Past Surgical History  Procedure Date  . Colonoscopy     benign polyps (12/2000), repeat colonoscopy performed in 2007  . Endometrial biopsy     10/03 benign columnar mucosa (limited material)    . Appendectomy   . Cholecystectomy   . Kidney surgery     Discharge Planning  Discharge Planning Living Arrangements: Family members Support Systems: Family members;Church/faith community Assistance Needed: Pt may require supervision level at discharge, due to history of falls. But doesn;t have it since niece works. Do you have any problems obtaining your medications?: No Type of Residence: Private residence [place]: No Patient expects to be discharged to:: Home with niece and niece's boyfriend Case Management Consult Needed: Yes (Comment) (RNCM- already following)  Social/Family/Support Systems Social/Family/Support Systems Patient Roles: Parent Contact Information: Mady Haagensen [phone] Anticipated Caregiver: Niece  Anticipated Caregiver's Contact Information: see above Ability/Limitations of Caregiver: Niece works so pt may be alone for short time Careers adviser: Evenings only  Employment Status Employment Status Employment Status: Disabled Freight forwarder Issues: No issues Guardian/Conservator: None  Abuse/Neglect Abuse/Neglect Assessment (Assessment to be complete while patient is alone) Physical Abuse: Denies Verbal Abuse: Denies Sexual Abuse: Denies Exploitation of patient/patient's resources: Denies Self-Neglect: Denies  Emotional Status Emotional Status Pt's affect, behavior adn adjustment status: Pt is motivated to improve but apprehensive due to falling and laying on the floor for eight hours.  She has a fear of falling now. Recent Psychosocial Issues: Other medical issues Pyschiatric History: Hx-Schizophrenia,depression/anxiety takes meds for and is followed by mental health.  She feels well controlled on these meds Deferred BDI due to seems to be coping appropriately at this time. Will contniue to monitor  Patient/Family Perceptions, Expectations & Goals Pt/Family Perceptions, Expectations and Goals Pt/Family  understanding of illness & functional limitations: Pt has a good understanding of her medical issues. She wants to get stronger while she is here. Niece is very involved in her care and visits daily here. Premorbid pt/family roles/activities: Aunt, Warehouse manager, Friend, etc Anticipated changes in roles/activities/participation: Plans to resume these roles Pt/family expectations/goals: Pt states: " I want to get stronger so I don't fall."  "I have a fear of falling now".  Pt needs to bulild her confidence while here and maybe obtain a Lifeline at home.  Community Resources [place] Agencies: None Premorbid Home Care/DME Agencies: None Transportation available at discharge: Niece  Discharge Research scientist (life sciences) Resources: Medicare;Medicaid (specify county) ([place]) Museum/gallery curator Resources: [employer] Financial Screen Referred: No Living Expenses: Lives with family Money Management: Family Home Management: Niece and pt does some Patient/Family Preliminary Plans: Return home with Niece, who is moving into her boyfriend's mobile home and pt to come also.  There will be times pt is alone at home, while niece works.   Clinical Impression:  Quiet woman who is motivated to do well, but has reservations about her confidence in walking, since falling.   She should do well here, will check into Lifeline for her at home, to give her peace of mind. Need to find out if new home has steps.      Elease Hashimoto 07/02/2011

## 2011-07-02 NOTE — Progress Notes (Addendum)
[facility] Individual Statement of Services  Patient Name:  Mimma Meer  Date:  07/02/2011  Welcome to the [facility].  Our goal is to provide you with an individualized program based on your diagnosis and situation, designed to meet your specific needs.  With this comprehensive rehabilitation program, you will be expected to participate in at least 3 hours of rehabilitation therapies Monday-Friday, with modified therapy programming on the weekends.  Your rehabilitation program will include the following services:  Physical Therapy (PT), Occupational Therapy (OT), 24 hour per day rehabilitation nursing, Therapeutic Recreaction (TR), Case Management Investment banker, corporate and Education officer, museum), Rehabilitation Medicine, Engineer, water.  Estimated length of stay: 7-9 days  Overall predicted outcomes: Modified independent  Weekly team conferences will be held on Wednesdays to discuss your progress.  Your RN Case Writer will talk with you frequently to get your input and to update you on team discussions.  Team conferences with you and your family in attendance may also be held.  Depending on your progress and recovery, your program may change.  Your RN Case Engineer, production will coordinate services and will keep you informed of any changes.  Your RN Tourist information centre manager and SW names and contact numbers are listed  below.  The following services may also be recommended but are not provided by the [facility] will be made to provide these services after discharge if needed.  Arrangements include referral to agencies that provide these services.  Your insurance has been verified to be:  Medicaid  Your primary doctor is:  Dr. Lynne Leader  Pertinent information will be  shared with your doctor and your insurance company.  Case Manager: Jesse Fall, [facility] [phone]  Social Worker:  Ovidio Kin, [place]  Information discussed with and copy given to patient by: Flo Shanks, 07/02/11, 3:45PM     Called pt's niece and discussed above.

## 2011-07-02 NOTE — Progress Notes (Signed)
Occupational Therapy Assessment and Plan  Patient Details  Name: [name] MRN: [id number] Date of Birth: Feb 03, 1951  OT Diagnosis: muscle weakness generalized Rehab Potential: Rehab Potential: Good ELOS: 7-10 dAYS   Assessment & Plan  Patient transferred to CIR on 07/01/2011 .  [name] is an 60 y.o. 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 Patient with generalized weakness for the past months with increased falls. Noted to have LLE cellulitis and started on antibiotics for treatment. Noted to have rhabdomyolysis with CK @2112  and treated with gentle hydration. BLE dopplers done and negative for DVT. PT evaluation done 11/12 and patient limited by pain. MRI lumbar spine done and Dr. Annette Stable consulted for input. He felt that patient's LLE pain not related to stenosis and no surgical intervention needed. 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 Pain and falls multifactorial likely due to polyneuropathy. PTA, pt lived with niece who works during the day, but comes home to check on her intermittently. Pt states she was independent with all ADL and mobility and completed basic IADL - simple meal prep and light cleaning and laundry. Pt does not drive. Upon D/C, pt will live in a mobile home. Pt has not lived in this home PTA. It has 4 STE. Pt has a very close relationship with her niece and has recently lost her mother this past May.  Pt presents to CIR requiring Mod A with LB ADL and Min A with UB ADL. Mod A with tranfers secondary to muscle weakness and muscle joint tightness.  Prior to hospitalization, patient could complete ADL and IADL independently, however, has been having more falls .  Patient will benefit from skilled intervention to increase independence with basic self-care skills and increase level of independence with iADL  prior to discharge home with care partner.  Anticipate patient will require intermittent supervision and follow up home health.  OT - End of Session Activity Tolerance: Tolerates 10 - 20 min activity with multiple rests Endurance Deficit: Yes Endurance Deficit Description: requires rest breaks OT Assessment Rehab Potential: Good Barriers to Discharge: None OT Plan OT Frequency: 1-2 X/day, 60-90 minutes Estimated Length of Stay: 7-10 dAYS OT Treatment/Interventions: Balance/vestibular training;Community reintegration;DME/adaptive equipment instruction;Patient/family education;Functional mobility training;Self Care/advanced ADL retraining;Therapeutic Activities;Therapeutic Exercise;UE/LE Strength taining/ROM;Visual/perceptual remediation/compensation  Precautions/Restrictions  Precautions Precautions: Fall   Pain Pain Assessment Pain Assessment: No/denies pain Pain Score: 10-Worst pain ever Home Living/Prior Functioning Home Living Lives With: Other (Comment) (niece) Receives Help From:  (niece) Home Access: Stairs to enter;Other (comment) (4) Bathroom Shower/Tub: Chiropodist: Standard Home Adaptive Equipment: Walker - rolling IADL History Homemaking Responsibilities: Yes Meal Prep Responsibility: Secondary Laundry Responsibility: Secondary Cleaning Responsibility: Secondary Bill Paying/Finance Responsibility: No Shopping Responsibility: No Child Care Responsibility: No Homemaking Comments: light housekeeping and cooking. did laundry. Current License: No Occupation: On disability Leisure and Hobbies: loves flowers and nature Prior Function Level of Independence: Other (comment) (frequent falls, did not use RW d/t "pride") Able to Take Stairs?: Yes (with difficulty d/RA, prefers ramp) Driving: No Vocation: On disability Leisure: Hobbies-yes (Comment) Comments: flowers ADL ADL Eating: Independent Where Assessed-Eating: Other (comment) (per pt  report) Grooming: Supervision/safety Where Assessed-Grooming: Sitting at sink Upper Body Bathing: Supervision/safety Where Assessed-Upper Body Bathing: Shower Lower Body Bathing: Minimal cueing;Moderate assistance Where Assessed-Lower Body Bathing: Shower Upper Body Dressing: Not assessed (no clothes) Lower  Body Dressing: Maximal assistance (max A for net panties and sock. no clothes) Where Assessed-Lower Body Dressing: Wheelchair;Sitting at sink;Standing at sink Toileting: Moderate assistance Where Assessed-Toileting: Glass blower/designer: Moderate assistance Toilet Transfer Method: Stand pivot Tub/Shower Transfer: Moderate assistance Tub/Shower Transfer Method: Stand pivot Tub/Shower Equipment: Transfer tub bench;Grab bars ADL Comments: Very aware of safety. States L foot feels like she is trying to move a cinder block and has a difficult time moving the foot. ulcers B lat aspect of feet. Vision/Perception  Vision - History Baseline Vision: Other (comment) (poor vision due to diabetes.) Visual History: Retinopathy (per pt) Patient Visual Report: No change from baseline Vision - Assessment Eye Alignment: Within Functional Limits Vision Assessment: Vision not tested Additional Comments: reports significant deficits, does not have glasses here Perception Perception:  (depth perception impaired) Praxis Praxis: Intact  Cognition Overall Cognitive Status: Impaired at baseline Arousal/Alertness: Awake/alert Orientation Level: Oriented X4 Attention: Alternating Alternating Attention: Appears intact Memory: Appears intact Awareness: Appears intact Problem Solving: Appears intact Executive Function:  (appears WFL for basic ADL) Safety/Judgment: Appears intact Sensation Sensation Light Touch: Impaired Detail Light Touch Impaired Details: Absent LLE;Impaired RLE (L absent mid shin to foot) Stereognosis: Appears Intact Hot/Cold: Appears Intact Proprioception: Appears  Intact Additional Comments: c/o occasional numbness/tingling B hands which causes her to "dro stuff" Coordination Gross Motor Movements are Fluid and Coordinated: Yes Fine Motor Movements are Fluid and Coordinated: Yes Motor  Motor Motor: Abnormal postural alignment and control;Other (comment) Motor - Skilled Clinical Observations: difficulty trying to bend knees, appeared not to be related to strenght or ROM and had been noted on acute Mobility  Bed Mobility Bed Mobility: Yes Rolling Right: 6: Modified independent (Device/Increase time) Right Sidelying to Sit: 6: Modified independent (Device/Increase time) Supine to Sit: 4: Min assist Supine to Sit Details: Verbal cues for technique;Manual facilitation for weight shifting Sit to Supine - Right: 3: Mod assist (A to lift both legs) Transfers Transfers: Yes Sit to Stand: From bed;Other (comment) (tub bench) Stand to Sit: 3: Mod assist Stand to Sit Details (indicate cue type and reason): Tactile cues for posture;Verbal cues for technique;Verbal cues for sequencing;Visual cues/gestures for precautions/safety;Visual cues/gestures for sequencing;Verbal cues for safe use of DME/AE  Trunk/Postural Assessment  Thoracic Assessment Thoracic Assessment: Exceptions to [facility] (kyphotic) Lumbar Assessment Lumbar Assessment: Exceptions to [facility] (unable to reach feet. able to do PTA) Postural Control Postural Control: Deficits on evaluation Righting Reactions: impaired hip, ankle and stepping strategy  Balance Balance Balance Assessed: Yes Static Sitting Balance Static Sitting - Level of Assistance: 7: Independent Dynamic Standing Balance Dynamic Standing - Balance Support: No upper extremity supported Dynamic Standing - Level of Assistance: 3: Mod assist Dynamic Standing - Comments: , with 1 UE support on RW pt able to perfom minimal weight shifts (2" reach) with close S Extremity/Trunk Assessment RUE Assessment RUE Assessment: Exceptions to  [facility] RUE Strength RUE Overall Strength: Deficits (@ 4/5 throughout) LUE Assessment LUE Assessment: Exceptions to [facility] (4/5 throughout)  Recommendations for other services: none  Discharge Criteria: Patient will be discharged from OT if patient refuses treatment 3 consecutive times without medical reason, if treatment goals not met, if there is a change in medical status, if patient makes no progress towards goals or if patient is discharged from hospital.  The above assessment, treatment plan, treatment alternatives and goals were discussed and mutually agreed upon by patient. Family not present.  Skilled Intervention: Pt seen for ADL retraining at [facility] with focus on endurance, strength and  mobility to increase indep with ADL. Will assess use of long handled sponeg and LB AE to increase indep with ADL   Aby Gessel,HILLARY 07/02/2011, 2:53 PM

## 2011-07-02 NOTE — Progress Notes (Signed)
Inpatient Diabetes Program Recommendations  AACE/ADA: New Consensus Statement on Inpatient Glycemic Control (2009)  Target Ranges:  Prepandial:   less than 140 mg/dL      Peak postprandial:   less than 180 mg/dL (1-2 hours)      Critically ill patients:  140 - 180 mg/dL   Reason for Visit:CBG's greater than goal.  Inpatient Diabetes Program Recommendations Insulin - Basal: Restart Lantus 40 units daily. Insulin - Meal Coverage: Please add Novolog meal coverage 4 units tid with meals.  Note:

## 2011-07-03 LAB — GLUCOSE, CAPILLARY
Glucose-Capillary: 100 mg/dL — ABNORMAL HIGH (ref 70–99)
Glucose-Capillary: 128 mg/dL — ABNORMAL HIGH (ref 70–99)
Glucose-Capillary: 156 mg/dL — ABNORMAL HIGH (ref 70–99)
Glucose-Capillary: 157 mg/dL — ABNORMAL HIGH (ref 70–99)
Glucose-Capillary: 173 mg/dL — ABNORMAL HIGH (ref 70–99)

## 2011-07-03 MED ORDER — ALPRAZOLAM 0.25 MG PO TABS: 0.2500 mg | ORAL_TABLET | Freq: Four times a day (QID) | ORAL | Status: DC | PRN

## 2011-07-03 NOTE — Progress Notes (Signed)
Patient pleasant and cooperative, appropriate for situation and follows command appropriately. Complains of pain x 1 in therapy, 2 tabs Vicodin given with relief after an hour. BM x 1, continently.  No unsafe issues noted. Continue with plan of care. Winfrey Chillemi, Dione Plover

## 2011-07-03 NOTE — Progress Notes (Signed)
Physical Therapy Note  Patient Details  Name: [name] MRN: [id number] Date of Birth: May 26, 1951 Today's Date: 07/03/2011  12:59- 14:01 62 minutes  Pain: in left leg "like a charley horse on the back of my leg", 10/10. Requested pain meds.   Skilled therapy intervention during individual treatment to address: gait, bilateral LE strength, and functional endurance.  Pt performed supine-sit with supervision. Squat-pivot transfer bed-w/c with min@. Gait with RW x 19' x2 with min@, significant decrease speed. During second walk pt stood approx. 3-4 minutes to take pain meds in the middle of the walk with min@. Nustep for UE and LE strengthening 7min x 2, for a total of 77 steps, very slow speed.  Pt stated several times during treatment, "I'll work hard"  Improved from level of activity on 11/16.      Waylan Boga 07/03/2011, 1:56 PM

## 2011-07-03 NOTE — Progress Notes (Signed)
Subjective/Complaints: No new complaints.  "I had a nightmare - I saw my mama... She died in this hospital"...  No pain, SOB or CP  Objective: Vital Signs: Blood pressure 132/80, pulse 86, temperature 97.7 F (36.5 C), temperature source Oral, resp. rate 20, height 5' (1.524 m), weight 198 lb 6.6 oz (90 kg), SpO2 98.00%. No results found.  Basename 07/02/11 0705 07/01/11 1728  WBC 9.4 9.0  HGB 10.5* 12.1  HCT 31.2* 35.0*  PLT 142* 170    Basename 07/02/11 0705 07/01/11 1728 07/01/11 0645  NA 139 -- 137  K 4.2 -- 4.1  CL 108 -- 106  CO2 25 -- 23  GLUCOSE 145* -- 160*  BUN 21 -- 23  CREATININE 0.94 0.86 --  CALCIUM 9.4 -- 9.4    Physical Exam: General appearance: alert, cooperative and appears older than stated age Resp: clear to auscultation bilaterally Cardio: regular rate and rhythm GI: soft, non-tender; bowel sounds normal; no masses,  no organomegaly Extremities: Stasis changed BLE.  Cellulitis LLE improving with dry dressing on ulcer.  Continues with sensitivity         Left calf>left shin and bilateral feet. Skin: diabetic ulcers bilateral feet due to pressure and left shin Neurologic: alert and oriented X 3, LLE weakness.  Neuropathy BLE.   Assessment/Plan:  1. Functional deficits secondary to Fall, rhabdomyolysis, severe diabetic neuropathy which require 3+ hours per day of interdisciplinary therapy in a comprehensive inpatient rehab setting. Physiatrist is providing close team supervision and 24 hour management of active medical problems listed below. Physiatrist and rehab team continue to assess barriers to discharge/monitor patient progress toward functional and medical goals. Mobility: Bed Mobility Bed Mobility: Yes Rolling Right: 6: Modified independent (Device/Increase time) Right Sidelying to Sit: 6: Modified independent (Device/Increase time) Supine to Sit: 4: Min assist Sit to Supine - Right: 3: Mod assist (A to lift both legs) Transfers Transfers:  Yes Sit to Stand: From bed;Other (comment) (tub bench) Stand to Sit: 3: Mod assist Stand Pivot Transfers: 4: Min assist Ambulation/Gait Ambulation/Gait Assistance: 3: Mod assist Ambulation/Gait Assistance Details (indicate cue type and reason): decreased step lenght, decreased stance time on L but excessive double stance time Ambulation Distance (Feet): 2 Feet Gait velocity: .1 ft/sec (slower than on acute, may be related to fatigue, with RW) Stairs: Yes Stairs Assistance: 3: Mod assist Stairs Assistance Details (indicate cue type and reason): assist to raise LLE onto step and for balance, pt VERY fearful Stair Management Technique: Two rails;Step to pattern Number of Stairs: 5  Height of Stairs: 6  Wheelchair Mobility Wheelchair Mobility: Yes Wheelchair Assistance: 4: Energy manager: Both upper extremities Wheelchair Parts Management: Needs assistance Distance: 40', c/o UE pain d/t RA, instructed not to concentrate on w/c propulsion d/t concerns for UE preservation and need for RW for safety with gait ADL:   Cognition: Cognition Overall Cognitive Status: Impaired at baseline Arousal/Alertness: Awake/alert Orientation Level: Oriented to person;Oriented to place Attention: Alternating Alternating Attention: Appears intact Memory: Appears intact Awareness: Appears intact Problem Solving: Appears intact Executive Function:  (appears WFL for basic ADL) Safety/Judgment: Appears intact Cognition Arousal/Alertness: Awake/alert Orientation Level: Oriented to person;Oriented to place      Medical Problem List and Plan:  1. DVT Prophylaxis/Anticoagulation: Pharmaceutical: Lovenox  2. Pain Management: prn Vicodin effective. Robaxin helpful for spasms 3. Mood: monitor for now. Very motivated to get better.   Patient Active Hospital Problem List:  4. Rhabdomyolysis:  Assessment: CK improving recheck levels next week.  Renal status normalized today Plan:   Continue to push po fluids. Resume metformin.   5. Diabetes:  Assessment: blood sugars poorly controlled due to stress dose steriods. Prednisone decreased to home dose today.  Plan: Monitor with AC/HS CBG checks. Continue lantus bid. Increase meal coverge for tighter control.  Resume metformin. 6. HTN:  Assessment:resonalble control currently. Ace added due to DM/ for renal protection today.  Plan: Monitor renal status with ARB on board. Off lasix and spironolactone currently. Resume clonidine for schizophrenia.  7.OSA:  Assessment:compliant with CPAP. Slept better last night.  Plan: Continue CPAP. Monitor sleep patterns for now.  8. RA:  Assessment: Symptoms controlled on home regimen.  Plan: Continue hydroxychloroquine, methotrexate, folic acid and prednisone.  9. Cellulitis:  Assessment: Erythema and tenderness improving. IV vancomycin stopped today.  Plan: Recommendations to continue antibiotic for 14 days. ?Need for Levaquin AND clindamycin. Off diuretics which will likely cause increase in peripheral edema with increased activity. Need to monitor for now. ? Order diabetic shoes.  10. Right chest wall pain Assessment: Musculoskeletal in nature. Chest xray negative for rib fracture.  Plan: Continue lidocaine patch as this is effective.  11. Hypokalemia:  Assessment: Resolved with supplement.  Plan: Will hold supplement for now.  12. Gout:  Assessment: Monitor for symptoms with rhabdomyolysis.  Plan: Continue allopurinol.  13. BLE weakness:  Assessment: Multifactorial due to neuropathy, fibromyalgia, RA and deconditioning. ? Statin related.  Continue to hold statin and monitor. Plan: Physical therapy for strengthening. Reports LLE pain due to tightness/spasms.  16. Vitamin D deficiency:  Assessment: Low vitamin D levels @ 26. With ongoing weakness. Started on supplement  Plan: Continue 50,000 units weekly for now.  61. Asthma/ COPD hx:  Assessment: Cough.  Decreased wheezing    Plan: Continue advair. May need nebs scheduled. Tessalon helping with cough symptoms.  18. Constipation:  Assessment: Multiple stools with Miralax.  Plan: Decrease to once a day. Monitor stooling For C diff symptoms as antibiotics on board.  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  Plan: Continue doxepin, lexapro, and clonidine (helps with catatonia)  11.  Anemia: better  Continue iron.     Alex Bliss Tsang 07/03/2011, 10:54 AM

## 2011-07-03 NOTE — Progress Notes (Signed)
Pt awake from sleep, seem to be very shaky, she stated she had a bad dream she keep repeating "dont let them get me" . cbg was check and it was 154, vital sign WNL. On call DR was notify and he gave order for xanax .25mg   Prn q6hr

## 2011-07-04 LAB — GLUCOSE, CAPILLARY
Glucose-Capillary: 104 mg/dL — ABNORMAL HIGH (ref 70–99)
Glucose-Capillary: 236 mg/dL — ABNORMAL HIGH (ref 70–99)
Glucose-Capillary: 167 mg/dL — ABNORMAL HIGH (ref 70–99)
Glucose-Capillary: 175 mg/dL — ABNORMAL HIGH (ref 70–99)

## 2011-07-04 MED ORDER — WHITE PETROLATUM GEL
Status: AC
  Administered 2011-07-04: 15:00:00
  Filled 2011-07-04: qty 5

## 2011-07-04 NOTE — Progress Notes (Signed)
Occupational Therapy Session Note  Patient Details  Name: Kahlan Sanjose MRN: [id number] Date of Birth: 04-02-1951  Skilled Therapeutic Interventions/Progress Updates:  Today's date: 07/04/2011; Time:  9030-1015;45 minutes AM treatment  ADL in w/c at sink with focus standing on sore bilateral lower legs and feet and numb left bottom of foot to increase standing balance, sit to stand and endurance for self care  Time: 1400-1440 Time Calculation (min): 40 min; PM treatment:standing balance static and dynamic- patient unaware she stood with left heel off floor due to decreased sensation until informed of this by clinician.  As a result worked on heavy weight bearing through L LE while standing and seated to increase proprioceptive feedback and awareness  Precautions: Precautions Precautions: Fall Required Braces or Orthoses: Yes (diabetic shoes have been ordered per pt) Restrictions Weight Bearing Restrictions: No  Short Term Goals: OT Short Term Goal 1: ModI bathing with nec AE shower level. OT Short Term Goal 2: Mod I dressing with nec AE sit - stand. OT Short Term Goal 3: Mod I toilet transfer and toileting RW level with nec DME. OT Short Term Goal 4: Mod I tubbench transfer RW level. OT Short Term Goal 5: simple home mgnt task RW level @ Mod I.  Therapy/Group: Individual Therapy  [facility] [address] 07/04/2011, 5:27 PM

## 2011-07-04 NOTE — Progress Notes (Signed)
Physical Therapy Session Note  Patient Details  Name: [name] MRN: [id number] Date of Birth: 23-Sep-1950  Today's Date: 07/04/2011 Time:  - 10:14-11:15    Precautions: Precautions Precautions: Fall Required Braces or Orthoses: Yes (diabetic shoes have been ordered per pt) Restrictions Weight Bearing Restrictions: No  Pain- premeidcated, c/o charlie horse type pain in L calf but able to participate 8/10  Short Term Goals= LTG d/t ELOS  vitals with activity BP 115/72, 97%, HR 86  Skilled Therapeutic Interventions/Progress Updates: worked on strength, activity tolerance, gait quality; patient wearing shoes family had brought and states they are not the ones that caused the wounds     Mobility transfers sit to stand to RW with S using BUE, decreased weight on LLE, decreased eccentric control with fatigue   Locomotion : household gait training with RW practicing sidestepping, turns, backing up 25 ft, 20 ft, 20 ft close supervision , slow velocity, decreased foot clearance on L, patient looks down to watch feet d/t severely impaired sensation in feet; controlled environment gait with RW 35 ft using auditory cues to assure L foot did not drag and instructional cues to increase stride and step length, try for step thru pattern (with moderate success) to increase cadence and functional efficiency         Exercises:Therapeutic exercise performed with LE to increase strength and muscle endurancex for functional mobility.  toe taps standing/ on 6 inch step/ with BUE support, required excessive time to raise LE and visual input to clear lip on step for strengthening x5, seated rest and repeated 5 x but needed min A to raise LLE onto stepduring second set - activity took almost 15 minutes due to bradykinesia      Therapy/Group: 10:14  Othelia Pulling 07/04/2011, 10:21 AM

## 2011-07-04 NOTE — Progress Notes (Signed)
Subjective/Complaints: No new complaints. Slept well. No pain, SOB or CP  Objective: Vital Signs: Blood pressure 111/57, pulse 78, temperature 97.7 F (36.5 C), temperature source Oral, resp. rate 20, height 5' (1.524 m), weight 198 lb 6.6 oz (90 kg), SpO2 95.00%. No results found.  Basename 07/02/11 0705 07/01/11 1728  WBC 9.4 9.0  HGB 10.5* 12.1  HCT 31.2* 35.0*  PLT 142* 170    Basename 07/02/11 0705 07/01/11 1728  NA 139 --  K 4.2 --  CL 108 --  CO2 25 --  GLUCOSE 145* --  BUN 21 --  CREATININE 0.94 0.86  CALCIUM 9.4 --    Physical Exam: General appearance: alert, cooperative and appears older than stated age Resp: clear to auscultation bilaterally Cardio: regular rate and rhythm GI: soft, non-tender; bowel sounds normal; no masses,  no organomegaly Extremities: Stasis changed BLE.  Cellulitis LLE improving with dry dressing on ulcer.  Continues with sensitivity         Left calf>left shin and bilateral feet. Skin: diabetic ulcers bilateral feet due to pressure and left shin Neurologic: alert and oriented X 3, LLE weakness.  Neuropathy BLE.   Assessment/Plan:  1. Functional deficits secondary to Fall, rhabdomyolysis, severe diabetic neuropathy which require 3+ hours per day of interdisciplinary therapy in a comprehensive inpatient rehab setting. Physiatrist is providing close team supervision and 24 hour management of active medical problems listed below. Physiatrist and rehab team continue to assess barriers to discharge/monitor patient progress toward functional and medical goals. Mobility: Bed Mobility Bed Mobility: Yes Rolling Right: 6: Modified independent (Device/Increase time) Right Sidelying to Sit: 6: Modified independent (Device/Increase time) Supine to Sit: 4: Min assist Sit to Supine - Right: 3: Mod assist (A to lift both legs) Transfers Transfers: Yes Sit to Stand: From bed;Other (comment) (tub bench) Stand to Sit: 3: Mod assist Stand Pivot  Transfers: 4: Min assist Ambulation/Gait Ambulation/Gait Assistance: 4: Min assist Ambulation/Gait Assistance Details (indicate cue type and reason): decreased step lenght, decreased stance time on L but excessive double stance time Ambulation Distance (Feet): 2 Feet Gait velocity: .1 ft/sec (slower than on acute, may be related to fatigue, with RW) Stairs: Yes Stairs Assistance: 3: Mod assist Stairs Assistance Details (indicate cue type and reason): assist to raise LLE onto step and for balance, pt VERY fearful Stair Management Technique: Two rails;Step to pattern Number of Stairs: 5  Height of Stairs: 6  Wheelchair Mobility Wheelchair Mobility: Yes Wheelchair Assistance: 4: Energy manager: Both upper extremities Wheelchair Parts Management: Needs assistance Distance: 40', c/o UE pain d/t RA, instructed not to concentrate on w/c propulsion d/t concerns for UE preservation and need for RW for safety with gait ADL:   Cognition: Cognition Overall Cognitive Status: Impaired at baseline Arousal/Alertness: Awake/alert Orientation Level: Oriented X4 Attention: Alternating Alternating Attention: Appears intact Memory: Appears intact Awareness: Appears intact Problem Solving: Appears intact Executive Function:  (appears WFL for basic ADL) Safety/Judgment: Appears intact Cognition Arousal/Alertness: Awake/alert Orientation Level: Oriented X4      Medical Problem List and Plan:  1. DVT Prophylaxis/Anticoagulation: Pharmaceutical: Lovenox  2. Pain Management: prn Vicodin effective. Robaxin helpful for spasms 3. Mood: monitor for now. Very motivated to get better.   Patient Active Hospital Problem List:  4. Rhabdomyolysis:  Assessment: CK improving recheck levels next week.  Renal status normalized today Plan:  Continue to push po fluids. Resume metformin.   5. Diabetes:  Assessment: blood sugars poorly controlled due to stress dose steriods. Prednisone  decreased to home dose today.  Plan: Monitor with AC/HS CBG checks. Continue lantus bid. Increase meal coverge for tighter control.  Resume metformin. 6. HTN:  Assessment:resonalble control currently. Ace added due to DM/ for renal protection today.  Plan: Monitor renal status with ARB on board. Off lasix and spironolactone currently. Resume clonidine for schizophrenia.  7.OSA:  Assessment:compliant with CPAP. Slept better last night.  Plan: Continue CPAP. Monitor sleep patterns for now.  8. RA:  Assessment: Symptoms controlled on home regimen.  Plan: Continue hydroxychloroquine, methotrexate, folic acid and prednisone.  9. Cellulitis:  Assessment: Erythema and tenderness improving. IV vancomycin stopped today.  Plan: Recommendations to continue antibiotic for 14 days. On Doxy.  Off diuretics which will likely cause increase in peripheral edema with increased activity. Need to monitor for now. ? Order diabetic shoes.  10. Right chest wall pain Assessment: Musculoskeletal in nature. Chest xray negative for rib fracture.  Plan: Continue lidocaine patch as this is effective.  11. Hypokalemia:  Assessment: Resolved with supplement.  Plan: Will hold supplement for now.  12. Gout:  Assessment: Monitor for symptoms with rhabdomyolysis.  Plan: Continue allopurinol.  13. BLE weakness:  Assessment: Multifactorial due to neuropathy, fibromyalgia, RA and deconditioning. ? Statin related.  Continue to hold statin and monitor. Plan: Physical therapy for strengthening. Reports LLE pain due to tightness/spasms.  16. Vitamin D deficiency:  Assessment: Low vitamin D levels @ 26. With ongoing weakness. Started on supplement  Plan: Continue 50,000 units weekly for now.  95. Asthma/ COPD hx:  Assessment: Cough.  Decreased wheezing  Plan: Continue advair. May need nebs scheduled. Tessalon helping with cough symptoms.  18. Constipation:  Assessment: Multiple stools with Miralax.  Plan: Decrease to once a  day. Monitor stooling For C diff symptoms as antibiotics on board.  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  Plan: Continue doxepin, lexapro, and clonidine (helps with catatonia)  11.  Anemia: better  Continue iron.     Alex Plotnikov 07/04/2011, 9:36 AM

## 2011-07-04 NOTE — Progress Notes (Signed)
Physical Therapy Session Note  Patient Details  Name: [name] MRN: [id number] Date of Birth: 1951/05/14  Today's Date: 07/04/2011 Time: [id number] Time Calculation (min): 58 min  Precautions: Precautions Precautions: Fall Required Braces or Orthoses: Yes (diabetic shoes have been ordered per pt) Restrictions Weight Bearing Restrictions: No   Skilled Therapeutic Interventions/Progress Updates:   Pt was seen for gait training with rolling walker to increase speed, safety and safety as well as therapeutic exercises for strengthening   Pain Pt reports no pain when at rest.  She c/o "Charlie horse" type pain in the back of her L leg when standing.  She reports pain becomes a 10/10 when performing activities in standing. Locomotion  Wheelchair mobility limited due to RA in B shoulders.  She was able to push wc 50' this afternoon.  Pt able to walk with RW x 18'x 2 with min A and verbal cues to bend L hip and knee to increase clearance of L foot.  Pt able to clear foot with each step this afternoon.  She is able to achieve foot flat, but not heelstrike.    Exercises  In parallel bars, performed sidestepping to left and right the length of the bars with decreased foot clearance with abd of L.  Performed stepping over yardstick on the floor forward and backward with each foot times 5 reps.  Speed of all activities decreased and pt must look down at her feet for visual input while performing tasks.  On NuStep, pt able to work at level 3 x 5 min and x 4 min.        Therapy/Group: Individual Therapy  Flint Hakeem,[name] 07/04/2011, 1:16 PM

## 2011-07-05 LAB — URINE CULTURE
Colony Count: 50000
Culture  Setup Time: 201211160848

## 2011-07-05 LAB — BASIC METABOLIC PANEL
BUN: 15 mg/dL (ref 6–23)
CO2: 25 mEq/L (ref 19–32)
Calcium: 9 mg/dL (ref 8.4–10.5)
Chloride: 105 mEq/L (ref 96–112)
Creatinine, Ser: 0.74 mg/dL (ref 0.50–1.10)
GFR calc Af Amer: >90 mL/min (ref >90)
GFR calc non Af Amer: >90 mL/min (ref >90)
Glucose, Bld: 129 mg/dL — ABNORMAL HIGH (ref 70–99)
Potassium: 3.5 mEq/L (ref 3.5–5.1)
Sodium: 138 mEq/L (ref 135–145)

## 2011-07-05 LAB — GLUCOSE, CAPILLARY
Glucose-Capillary: 132 mg/dL — ABNORMAL HIGH (ref 70–99)
Glucose-Capillary: 192 mg/dL — ABNORMAL HIGH (ref 70–99)
Glucose-Capillary: 201 mg/dL — ABNORMAL HIGH (ref 70–99)
Glucose-Capillary: 151 mg/dL — ABNORMAL HIGH (ref 70–99)

## 2011-07-05 LAB — CK: Total CK: 27 U/L (ref 7–177)

## 2011-07-05 NOTE — Plan of Care (Signed)
Problem: RH SKIN INTEGRITY Goal: RH STG SKIN FREE OF INFECTION/BREAKDOWN Outcome: Progressing See wound care documentation  Problem: RH PAIN MANAGEMENT Goal: RH STG PAIN MANAGED AT OR BELOW PT'S PAIN GOAL Outcome: Progressing Patient will have pain <2.

## 2011-07-05 NOTE — Progress Notes (Signed)
Physical Therapy Session Note  Patient Details  Name: [name] MRN: [id number] Date of Birth: 1950-11-20  Today's Date: 07/05/2011 Time: [id number] Time Calculation (min): 59 min  Precautions: Precautions Precautions: Fall Required Braces or Orthoses: Yes (diabetic shoes have been ordered per pt) Restrictions Weight Bearing Restrictions: No  Short Term Goals: LTG secondary to LOS Pain Pain Assessment Pain Assessment: 0-10 Pain Score: 10-Worst pain ever Pain Type: Chronic pain Pain Location: Calf Pain Orientation: Left Pain Descriptors: Cramping Pain Onset: On-going Patients Stated Pain Goal: 1 Pain Intervention(s): RN made aware Mobility Transfers Transfers: Yes Sit to Stand: 4: Min assist Sit to Stand Details: Verbal cues for precautions/safety Sit to Stand Details (indicate cue type and reason): verbal cues for safe hand placement to push up from surface seated on Stand to Sit: 4: Min assist Stand to Sit Details (indicate cue type and reason): Verbal cues for precautions/safety Stand to Sit Details: cues for hand placment on surface transferring to Stand Pivot Transfers: 4: Min Psychologist, occupational Details: Verbal cues for precautions/safety Stand Pivot Transfer Details (indicate cue type and reason): Stand pivot with RW to toilet or w/c with min A but no lifting or lowering required.  Patient able to perform all toileting tasks with supervision only without UE support while managing clothes. Locomotion  Ambulation Ambulation: Yes Ambulation/Gait Assistance: 4: Min assist Ambulation Distance (Feet): 20 Feet (+ 50 feet) Assistive device: Rolling walker Ambulation/Gait Assistance Details: Tactile cues for weight shifting;Verbal cues for sequencing;Verbal cues for gait pattern Ambulation/Gait Assistance Details (indicate cue type and reason): Gait training with focus on increasing stance time on LLE and step length RLE with verbal and tactile cues for  continuous steps, continued anterior translation of COG and facilitation of forward motion of RW  Stairs / Additional Locomotion Stairs: Yes Stairs Assistance: 3: Mod assist Stairs Assistance Details: Tactile cues for initiation;Tactile cues for placement;Tactile cues for weight shifting;Verbal cues for sequencing;Verbal cues for technique;Verbal cues for precautions/safety Stairs Assistance Details (indicate cue type and reason): Required mod A to advance each LE and clear toes, for anterior weight shift onto stance LE, eccentric control for descent and cues for safe sequence Stair Management Technique: Two rails Number of Stairs: 10  Height of Stairs: 6  Wheelchair Mobility Wheelchair Mobility: Yes Wheelchair Assistance: 4: Advertising account executive Details: Verbal cues for Astronomer: Both upper extremities Wheelchair Parts Management: Needs assistance Distance: Assistance needed for sequence for turning L/R and clear doorways  Exercises Cardiovascular Exercises NuStep: 4 minutes at level 3 and RPE 11 + 4 minutes at level 4 at RPE 13 with bilat UE and LE with focus on increasing velocity by maintaining 40-50 steps per minute.  Therapy/Group: Individual Therapy  [name] Faucette 07/05/2011, 11:05 AM

## 2011-07-05 NOTE — Progress Notes (Signed)
Patient is alert and oriented x3. Patient complained of leg pain and was given pain meds x1 and spasm meds x1 which provided relief. Patient's blood glucose this shift has required sliding scale insulin coverage. See MAR for units given. Bilateral knee abrasions are healed. Wound to left lower leg and bilateral feet were cleansed with NS and new drsg applied. Patient remains continent of bowel and bladder. Patient placed on Contact Isolation for ESBL. Continue current plan of care.

## 2011-07-05 NOTE — Progress Notes (Signed)
Recreational Therapy Assessment and Plan  Patient Details  Name: [name] MRN: [id number] Date of Birth: 10-Apr-1951  Rehab Potential: Good ELOS: 7 days   Assessment Clinical Impression:   Patient transferred to CIR on 07/01/2011 . [name] is an 60 y.o. 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 Patient with generalized weakness for the past months with increased falls. Noted to have LLE cellulitis and started on antibiotics for treatment. Noted to have rhabdomyolysis with CK @2112  and treated with gentle hydration. BLE dopplers done and negative for DVT. PT evaluation done 11/12 and patient limited by pain. MRI lumbar spine done and Dr. Annette Stable consulted for input. He felt that patient's LLE pain not related to stenosis and no surgical intervention needed. 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 Pain and falls multifactorial likely due to polyneuropathy.  Pt presents with decreased activity tolerance, decreased functional mobility, decreased balance limiting pt's Independence with leisure community pursuits.   Recreational Therapy Leisure History/Participation Premorbid leisure interest/current participation: Games - Cross-word;Games - Word-search;Games - Jig-saw puzzles Spiritual Interests: Church Other Leisure Interests: Television Identified Leisure Barriers: transportation Leisure Participation Style: Alone;With Family/Friends Awareness of Community Resources: Good-identify 3 post discharge leisure resources [employer] Appropriate for Education?: Yes Patient Agreeable to [employer]?: Yes Patient agreeable to Pet Therapy: Yes Does patient have pets?: Yes (dogs,cats) Social interaction - Mood/Behavior: Cooperative Recreational Therapy Orientation Orientation -Reviewed with patient: Available activity  resources;Use of Dayroom Strengths/Weaknesses Patient Strengths/Abilities: Willingness to participate;Active premorbidly Patient weaknesses: Physical limitations  Plan Rec Therapy Plan Is patient appropriate for Therapeutic Recreation?: Yes Rehab Potential: Good Treatment times per week: Min 1 time per week for >20 min Estimated Length of Stay: 7 days Therapy Goals Achieved By:: Recreation/leisure participation;Community reintegration/education;1:1 session;Adaptive equipment instruction;Group participation (Comment);Patient/family education;Provide activity resources in room  Discharge Criteria: Patient will be discharged from TR if patient refuses treatment 3 consecutive times without medical reason.  If treatment goals not met, if there is a change in medical status, if patient makes no progress towards goals or if patient is discharged from hospital.  The above assessment, treatment plan, treatment alternatives and goals were discussed and mutually agreed upon: by the patient.  [name] 07/05/2011, 4:01 PM

## 2011-07-05 NOTE — Progress Notes (Signed)
CRITICAL VALUE ALERT  Critical value received:  Urine culture positive for ESBL  Date of notification:  07/05/2011  Time of notification:  [id number]  Critical value read back:yes  Nurse who received alert:  Jennelle Human  MD notified (1st page):  Hadassah Pais   Time of first page:  [phone]  MD notified (2nd page):  Time of second page:  Responding MD:  Hadassah Pais  Time MD responded:  [phone]

## 2011-07-05 NOTE — Progress Notes (Signed)
Subjective/Complaints: No new complaints. Slept well. No pain, SOB or CP  Objective: Vital Signs: Blood pressure 124/73, pulse 75, temperature 96.9 F (36.1 C), temperature source Oral, resp. rate 18, height 5' (1.524 m), weight 90 kg (198 lb 6.6 oz), SpO2 96.00%. No results found. No results found for this basename: WBC:2,HGB:2,HCT:2,PLT:2 in the last 72 hours No results found for this basename: NA:2,K:2,CL:2,CO2:2,GLUCOSE:2,BUN:2,CREATININE:2,CALCIUM:2 in the last 72 hours  Physical Exam: General appearance: alert, cooperative and appears older than stated age Resp: clear to auscultation bilaterally Cardio: regular rate and rhythm GI: soft, non-tender; bowel sounds normal; no masses,  no organomegaly Extremities: Stasis changed BLE.  Cellulitis LLE improving with dry dressing on ulcer.  Continues with sensitivity         Left calf>left shin and bilateral feet. Skin: diabetic ulcers bilateral feet due to pressure and left shin Neurologic: alert and oriented X 3, LLE weakness.  Neuropathy BLE.   Assessment/Plan:  1. Functional deficits secondary to Fall, rhabdomyolysis, severe diabetic neuropathy which require 3+ hours per day of interdisciplinary therapy in a comprehensive inpatient rehab setting. Physiatrist is providing close team supervision and 24 hour management of active medical problems listed below. Physiatrist and rehab team continue to assess barriers to discharge/monitor patient progress toward functional and medical goals. Mobility: Bed Mobility Bed Mobility: Yes Rolling Right: 6: Modified independent (Device/Increase time) Right Sidelying to Sit: 6: Modified independent (Device/Increase time) Supine to Sit: 4: Min assist Sit to Supine - Right: 3: Mod assist (A to lift both legs) Transfers Transfers: Yes Sit to Stand: From bed;Other (comment) (tub bench) Stand to Sit: 3: Mod assist Stand Pivot Transfers: 4: Min assist Ambulation/Gait Ambulation/Gait  Assistance: 4: Min assist Ambulation/Gait Assistance Details (indicate cue type and reason): decreased step lenght, decreased stance time on L but excessive double stance time Ambulation Distance (Feet): 2 Feet Gait velocity: .1 ft/sec (slower than on acute, may be related to fatigue, with RW) Stairs: Yes Stairs Assistance: 3: Mod assist Stairs Assistance Details (indicate cue type and reason): assist to raise LLE onto step and for balance, pt VERY fearful Stair Management Technique: Two rails;Step to pattern Number of Stairs: 5  Height of Stairs: 6  Wheelchair Mobility Wheelchair Mobility: Yes Wheelchair Assistance: 4: Energy manager: Both upper extremities Wheelchair Parts Management: Needs assistance Distance: 30' ADL:   Cognition: Cognition Overall Cognitive Status: Impaired at baseline Arousal/Alertness: Awake/alert Orientation Level: Oriented X4 Attention: Alternating Alternating Attention: Appears intact Memory: Appears intact Awareness: Appears intact Problem Solving: Appears intact Executive Function:  (appears WFL for basic ADL) Safety/Judgment: Appears intact Cognition Arousal/Alertness: Awake/alert Orientation Level: Oriented X4      Medical Problem List and Plan:  1. DVT Prophylaxis/Anticoagulation: Pharmaceutical: Lovenox  2. Pain Management: prn Vicodin effective. 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  If progressive would repeat dopplers, cont lovenox and analgesics. 3. Mood: monitor for now. Very motivated to get better.   Patient Active Hospital Problem List:  4. Rhabdomyolysis:  Assessment: CK improving recheck levels next week.  Renal status normalized today Plan:  Continue to push po fluids. Resume metformin.   5. Diabetes:  Assessment: blood sugars poorly controlled due to stress dose steriods. Prednisone decreased to home dose today.  Plan: Monitor with AC/HS CBG  checks. Continue lantus bid. Increase meal coverge for tighter control.  Resume metformin. 6. HTN:  Assessment:resonalble control currently. Ace added due to DM/ for renal protection today.  Plan: Monitor renal status with ARB on board. Off lasix and spironolactone currently. Resume clonidine for schizophrenia.  7.OSA:  Assessment:compliant with CPAP. Slept better last night.  Plan: Continue CPAP. Monitor sleep patterns for now.  8. RA:  Assessment: Symptoms controlled on home regimen.  Plan: Continue hydroxychloroquine, methotrexate, folic acid and prednisone.  9. Cellulitis:  Assessment: Erythema and tenderness improving. IV vancomycin stopped today.  Plan: Recommendations to continue antibiotic for 14 days. On Doxy.  Off diuretics which will likely cause increase in peripheral edema with increased activity. Need to monitor for now. ? Order diabetic shoes.  10. Right chest wall pain Assessment: Musculoskeletal in nature. Chest xray negative for rib fracture.  Plan: Continue lidocaine patch as this is effective.  11. Hypokalemia:  Assessment: Resolved with supplement.  Plan: Will hold supplement for now.  12. Gout:  Assessment: Monitor for symptoms with rhabdomyolysis.  Plan: Continue allopurinol.  13. BLE weakness:  Assessment: Multifactorial due to neuropathy, fibromyalgia, RA and deconditioning. ? Statin related.  Continue to hold statin and monitor. Plan: Physical therapy for strengthening. Reports LLE pain due to tightness/spasms.  16. Vitamin D deficiency:  Assessment: Low vitamin D levels @ 26. With ongoing weakness. Started on supplement  Plan: Continue 50,000 units weekly for now.  63. Asthma/ COPD hx:  Assessment: Cough.  Decreased wheezing  Plan: Continue advair. May need nebs scheduled. Tessalon helping with cough symptoms.  18. Constipation:  Assessment: Multiple stools with Miralax.  Plan: Decrease to once a day. Monitor stooling For C diff symptoms as antibiotics on  board.  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  Plan: Continue doxepin, lexapro, and clonidine (helps with catatonia)  11.  Anemia: better  Continue iron.     Charlett Blake 07/05/2011, 7:54 AM

## 2011-07-05 NOTE — Progress Notes (Signed)
Occupational Therapy Note  Patient Details  Name: Shamira Chrisco MRN: [id number] Date of Birth: [birth year] Today's Date: 07/05/2011  1035-1130 55 min treat  Skilled Intervention: Focus on strength, endurance and compensatory techniques during ADL. Given long handled sponge to help with feet and back. S with transfer to shower RW level. Pt states she has a grab bar that she will take to her new home. Using a reacher to help pick up dropped items. Very cooperatie and motivated  Individual session  2nd session 1300-1330  Skilled Intervention: Focus o LB ADL retraining. Pt's show laces switched out for elastic shoestrings. Pt S with elastic laces and long handled shoe horn. Functional ambulation at RW level with S. When fatigues, starts dragging L foot. Excellent participation.  Individual session.  Coren Crownover,HILLARY 07/05/2011, 11:04 AM

## 2011-07-05 NOTE — Progress Notes (Signed)
Physical Therapy Session Note  Patient Details  Name: [name] MRN: [id number] Date of Birth: [birth year]  Today's Date: 07/05/2011 Time: [id number] Time Calculation (min): 39 min  Pain: Patient reports increased pain secondary to being stiff from prolonged sitting; RN notified   Precautions: Precautions Precautions: Fall Required Braces or Orthoses: Yes (diabetic shoes have been ordered per pt) Restrictions Weight Bearing Restrictions: No  Locomotion  Ambulation Ambulation: Yes Ambulation/Gait Assistance: 4: Min assist Ambulation Distance (Feet): 125 Feet (x2) Assistive device: Rolling walker Ambulation/Gait Assistance Details: Tactile cues for weight shifting;Visual cues/gestures for sequencing;Verbal cues for sequencing;Verbal cues for gait pattern Ambulation/Gait Assistance Details (indicate cue type and reason): Gait training with focus on increasing stance time on LLE and step length RLE with verbal and tactile cues for continuous steps, continued anterior translation of COG and facilitation of forward motion of RW and visual cues to prevent RLE from scissoring in front of LLE; scissoring returns when patient not looking at RLE   Exercises with bilat UE support on back of chair General Exercises - Lower Extremity Hip ABduction/ADduction: AROM;Strengthening;Both;5 reps;Standing Hip Flexion/Marching: AROM;Strengthening;Both;5 reps;Standing Other Exercises Other Exercises: Hip extension AROM; strengthening, bilaterally 5 reps each standing  Therapy/Group: Individual Therapy  [name] [place] 07/05/2011, 5:11 PM

## 2011-07-06 LAB — GLUCOSE, CAPILLARY
Glucose-Capillary: 144 mg/dL — ABNORMAL HIGH (ref 70–99)
Glucose-Capillary: 196 mg/dL — ABNORMAL HIGH (ref 70–99)
Glucose-Capillary: 167 mg/dL — ABNORMAL HIGH (ref 70–99)
Glucose-Capillary: 103 mg/dL — ABNORMAL HIGH (ref 70–99)

## 2011-07-06 NOTE — Progress Notes (Addendum)
Occupational Therapy Note  Patient Details  Name: Amany Sivilay MRN: [id number] Date of Birth: 11-11-1950 Today's Date: 07/06/2011  [id number] 60 min treat  Skilled Intervention: ADL retraining shower level using RW and tubbench and grab bars. Distant S with bathing. Using long handled sponge for LB bathing./ Undressed with S using reacher. Obtained clothes today @ RW level, using walker bag. Requires min vc for safety and problem solving using AE and AME. BLE increased swelling today. nsg notified. PA assessed. Pt's BLE wrapped with ace wraps to decrease swelling. BLE elevated. Excellent progress.  Individual sessioni   Haru Anspaugh 07/06/2011, 12:25 PM

## 2011-07-06 NOTE — Progress Notes (Addendum)
Physical Therapy Session Note  Patient Details  Name: [name] MRN: [id number] Date of Birth: Jun 03, 1951  Today's Date: 07/06/2011 Time: [id number] Time Calculation (min): 44 min  Precautions: Precautions Precautions: Fall Precaution Comments: Contact Precautions   Skilled Therapeutic Interventions/Progress Updates: Tx focused on gait speed and quality as well as LE strengthening and activity tolerance. Gait speed improved from 0.1 to 0.35 ft/sec, which is still at increased risk of falls.        Pain Pain Assessment Pain Assessment: No/denies pain Pain Score:   7 Pain Type: Acute pain Pain Location: Leg Pain Orientation: Left Pain Descriptors: Aching;Constant;Throbbing;Tightness;Tender;Sore Pain Onset: On-going Patients Stated Pain Goal: 1 Pain Intervention(s): Medication (See eMAR);Elevated extremity;MD notified (Comment) Multiple Pain Sites: No Mobility Transfers Transfers: Yes Sit to Stand: 5: Supervision;With upper extremity assist;From chair/3-in-1 Sit to Stand Details: Verbal cues for precautions/safety (Repeated cues for hand placement) Stand to Sit: 5: Supervision;With upper extremity assist;To chair/3-in-1 Stand to Sit Details (indicate cue type and reason):  (Hand placement) Stand Pivot Transfers: 5: Supervision Stand Pivot Transfer Details: Verbal cues for safe use of DME/AE Stand Pivot Transfer Details (indicate cue type and reason): Cues needed for safe hand placement during sit <> stand and full pivoting sequence with RW Locomotion  Ambulation Ambulation: Yes Ambulation/Gait Assistance: 5: Supervision Ambulation Distance (Feet): 125 Feet (+15 for gait speed) Assistive device: Rolling walker Ambulation/Gait Assistance Details:  (Cues for equal step length and increase pace as safely able) Gait Gait: Yes Gait Pattern: Impaired Gait Pattern: Decreased step length - right;Decreased stride length;Decreased dorsiflexion - left;Decreased weight shift to  left Gait velocity: 0.35 (With RW, cues to walk as fast as she can comfortably.)) Wheelchair Mobility Distance: 150       Exercises Cardiovascular Exercises NuStep: 8 min, level 4 bil UE/LE with cues for incrs pace, avg 45spm  Other Treatments    Therapy/Group: Individual Therapy  Windy Fast 07/06/2011, 2:19 PM

## 2011-07-06 NOTE — Progress Notes (Signed)
Physical Therapy Session Note  Patient Details  Name: [name] MRN: [id number] Date of Birth: May 05, 1951  Today's Date: 07/06/2011 Time: [id number] Time Calculation (min): 30 min  Precautions: Precautions Precautions: Fall Precaution Comments: Contact Precautions Required Braces or Orthoses: Yes (diabetic shoes have been ordered per pt) Restrictions Weight Bearing Restrictions: No  Pain Assessment Pain Assessment: Reports LLE pain; RN made aware Mobility Transfers Transfers: Yes Sit to Stand: 5: Supervision;With upper extremity assist;From chair/3-in-1 Sit to Stand Details: Verbal cues for precautions/safety Sit to Stand Details (indicate cue type and reason): cues needed for safe hand placement on surface transferring from and then reaching for RW Stand to Sit: 5: Supervision Stand to Sit Details (indicate cue type and reason):  (Hand placement) Stand Pivot Transfers: 5: Supervision Stand Pivot Transfer Details: Verbal cues for precautions/safety Stand Pivot Transfer Details (indicate cue type and reason): Cues needed for safe hand placement during stand pivot transfer to toilet  Locomotion  Ambulation Ambulation: Yes Ambulation/Gait Assistance: 5: Supervision Ambulation Distance (Feet): 150 Feet Assistive device: Rolling walker Ambulation/Gait Assistance Details: Verbal cues for precautions/safety;Verbal cues for sequencing;Verbal cues for gait pattern Ambulation/Gait Assistance Details (indicate cue type and reason): Gait over level carpet and tile with RW and supervision with improved anterior translation of COG and lateral weight shifting and acceptance on LLE for improved stance time on L and increased step and stride length bilaterally   Therapy/Group: Individual Therapy  [name] [facility] 07/06/2011, 4:42 PM

## 2011-07-06 NOTE — Progress Notes (Signed)
Patient ID: [name], female   DOB: 18-Feb-1951, 60 y.o.   MRN: [id number]    Subjective/Complaints: No new complaints. Slept well. No pain, SOB or CP  Objective: Vital Signs: Blood pressure 122/73, pulse 78, temperature 98 F (36.7 C), temperature source Oral, resp. rate 18, height 5' (1.524 m), weight 90 kg (198 lb 6.6 oz), SpO2 95.00%. No results found. No results found for this basename: WBC:2,HGB:2,HCT:2,PLT:2 in the last 72 hours  Basename 07/05/11 0655  NA 138  K 3.5  CL 105  CO2 25  GLUCOSE 129*  BUN 15  CREATININE 0.74  CALCIUM 9.0    Physical Exam: General appearance: alert, cooperative and appears older than stated age Resp: clear to auscultation bilaterally Cardio: regular rate and rhythm GI: soft, non-tender; bowel sounds normal; no masses,  no organomegaly Extremities: Stasis changed BLE.  Cellulitis LLE improving with dry dressing on ulcer.  Continues with sensitivity         Left calf>left shin and bilateral feet. Skin: diabetic ulcers bilateral feet due to pressure and left shin Neurologic: alert and oriented X 3, LLE weakness.  Neuropathy BLE.   Assessment/Plan:  1. Functional deficits secondary to Fall, rhabdomyolysis, severe diabetic neuropathy which require 3+ hours per day of interdisciplinary therapy in a comprehensive inpatient rehab setting. Physiatrist is providing close team supervision and 24 hour management of active medical problems listed below. Physiatrist and rehab team continue to assess barriers to discharge/monitor patient progress toward functional and medical goals. Mobility: Bed Mobility Bed Mobility: Yes Rolling Right: 6: Modified independent (Device/Increase time) Right Sidelying to Sit: 6: Modified independent (Device/Increase time) Supine to Sit: 4: Min assist Sit to Supine - Right: 3: Mod assist (A to lift both legs) Transfers Transfers: Yes Sit to Stand: 4: Min assist Sit to Stand Details (indicate cue type and reason):  verbal cues for safe hand placement to push up from surface seated on Stand to Sit: 4: Min assist Stand to Sit Details: cues for hand placment on surface transferring to Stand Pivot Transfers: 4: Min assist Stand Pivot Transfer Details (indicate cue type and reason): Stand pivot with RW to toilet or w/c with min A but no lifting or lowering required.  Patient able to perform all toileting tasks with supervision only without UE support while managing clothes. Ambulation/Gait Ambulation/Gait Assistance: 4: Min assist Ambulation/Gait Assistance Details (indicate cue type and reason): Gait training with focus on increasing stance time on LLE and step length RLE with verbal and tactile cues for continuous steps, continued anterior translation of COG and facilitation of forward motion of RW and visual cues to prevent RLE from scissoring in front of LLE; scissoring returns when patient not looking at RLE Ambulation Distance (Feet): 125 Feet (x2) Assistive device: Rolling walker Gait velocity: .1 ft/sec (slower than on acute, may be related to fatigue, with RW) Stairs: Yes Stairs Assistance: 3: Mod assist Stairs Assistance Details (indicate cue type and reason): Required mod A to advance each LE and clear toes, for anterior weight shift onto stance LE, eccentric control for descent and cues for safe sequence Stair Management Technique: Two rails Number of Stairs: 10  Height of Stairs: 6  Wheelchair Mobility Wheelchair Mobility: Yes Wheelchair Assistance: 4: Energy manager: Both upper extremities Wheelchair Parts Management: Needs assistance Distance: Assistance needed for sequence for turning L/R and clear doorways ADL:   Cognition: Cognition Overall Cognitive Status: Impaired at baseline Arousal/Alertness: Awake/alert Orientation Level: Oriented X4 Attention: Alternating Alternating Attention: Appears intact Memory:  Appears intact Awareness: Appears intact Problem  Solving: Appears intact Executive Function:  (appears WFL for basic ADL) Safety/Judgment: Appears intact Cognition Arousal/Alertness: Awake/alert Orientation Level: Oriented X4      Medical Problem List and Plan:  1. DVT Prophylaxis/Anticoagulation: Pharmaceutical: Lovenox  2. Pain Management: prn Vicodin effective. 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  If progressive would repeat dopplers, cont lovenox and analgesics. 3. Mood: monitor for now. Very motivated to get better.   Patient Active Hospital Problem List:  4. Rhabdomyolysis:  Assessment: CK improving recheck levels next week.  Renal status normalized today Plan:  Continue to push po fluids. Resume metformin.   5. Diabetes:  Assessment: blood sugars poorly controlled due to stress dose steriods. Prednisone decreased to home dose today.  Plan: Monitor with AC/HS CBG checks. Continue lantus bid. Increase meal coverge for tighter control.  Resume metformin. 6. HTN:  Assessment:resonalble control currently. Ace added due to DM/ for renal protection today.  Plan: Monitor renal status with ARB on board. Off lasix and spironolactone currently. Resume clonidine for schizophrenia.  7.OSA:  Assessment:compliant with CPAP. Slept better last night.  Plan: Continue CPAP. Monitor sleep patterns for now.  8. RA:  Assessment: Symptoms controlled on home regimen.  Plan: Continue hydroxychloroquine, methotrexate, folic acid and prednisone.  9. Cellulitis:  Assessment: Erythema and tenderness improving. IV vancomycin stopped today.  Plan: Recommendations to continue antibiotic for 14 days. On Doxy.  Off diuretics which will likely cause increase in peripheral edema with increased activity. Need to monitor for now. ? Order diabetic shoes.  10. Right chest wall pain Assessment: Musculoskeletal in nature. Chest xray negative for rib fracture.  Plan: Continue lidocaine patch as  this is effective.  11. Hypokalemia:  Assessment: Resolved with supplement.  Plan: Will hold supplement for now.  12. Gout:  Assessment: Monitor for symptoms with rhabdomyolysis.  Plan: Continue allopurinol.  13. BLE weakness:  Assessment: Multifactorial due to neuropathy, fibromyalgia, RA and deconditioning. ? Statin related.  Continue to hold statin and monitor. Plan: Physical therapy for strengthening. Reports LLE pain due to tightness/spasms.  16. Vitamin D deficiency:  Assessment: Low vitamin D levels @ 26. With ongoing weakness. Started on supplement  Plan: Continue 50,000 units weekly for now.  66. Asthma/ COPD hx:  Assessment: Cough.  Decreased wheezing  Plan: Continue advair. May need nebs scheduled. Tessalon helping with cough symptoms.  18. Constipation:  Assessment: Multiple stools with Miralax.  Plan: Decrease to once a day. Monitor stooling For C diff symptoms as antibiotics on board.  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  Plan: Continue doxepin, lexapro, and clonidine (helps with catatonia)  11.  Anemia: better  Continue iron. 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

## 2011-07-06 NOTE — Progress Notes (Signed)
Pt. Unable to void after 9 hours; Assisted up to BR for attempts; Intermittent cath = 900cc; Will continue PVR's and void attempts in bathroom; Scan at 4 hours to monitor volume.Elliot Cousin

## 2011-07-06 NOTE — Progress Notes (Signed)
Patient Details  Name: [name] MRN: [id number] Date of Birth: 26-Dec-1950  Today's Date: 07/06/2011 Time: 10-10:30   Rehab Potential: Good  ELOS:   10 days  Short Term Goals= LTG    Skilled Therapeutic Interventions/Progress Updates: Stood on foam mat to work a Insurance claims handler with and without UE support with Audiological scientist.  Pt required Min verbal cues for task completion.  No c/o pain.   Therapy/Group: Other: co-treat with PT  Activity Level: moderate Level of assist: Min Assist  Randeep Biondolillo 07/06/2011, 10:53 AM

## 2011-07-06 NOTE — Progress Notes (Signed)
Physical Therapy Session Note  Patient Details  Name: [name] MRN: [id number] Date of Birth: Jan 07, 1951  Today's Date: 07/06/2011 Time: [id number] Time Calculation (min): 54 min  Precautions: Precautions Precautions: Fall Precaution Comments: Contact Precautions Required Braces or Orthoses: Yes (diabetic shoes have been ordered per pt) Restrictions Weight Bearing Restrictions: No  Pain Pain Assessment Pain Assessment: 0-10 Pain Score:   5 Pain Type: Chronic pain Pain Location: Calf Pain Orientation: Left Pain Descriptors: Aching Pain Onset: On-going Patients Stated Pain Goal: 1 Pain Intervention(s): RN made aware Multiple Pain Sites: No Mobility Bed Mobility Bed Mobility: Yes Supine to Sit: 5: Supervision Supine to Sit Details: Verbal cues for sequencing;Verbal cues for precautions/safety Supine to Sit Details (indicate cue type and reason): Flat bed, no rail; pt required cues for sequence and safety secondary to attempting to pull on rail and therapist  Transfers Transfers: Yes Stand Pivot Transfers: 5: Supervision Stand Pivot Transfer Details: Verbal cues for sequencing;Verbal cues for technique;Visual cues/gestures for sequencing Stand Pivot Transfer Details (indicate cue type and reason): Cues needed for safe hand placement during sit <> stand and full pivoting sequence with RW  Balance Dynamic Standing Balance Dynamic Standing - Balance Support: No upper extremity supported Dynamic Standing - Level of Assistance: 3: Mod assist Dynamic Standing - Balance Activities: Lateral lean/weight shifting;Reaching across midline;Compliant surfaces Dynamic Standing - Comments: Standing on compliant foam with WBOS but no UE support and intermittent trunk support during puzzle activity on tall table with mod assist and manual facilitation for upright trunk, weight shifting and weight acceptance and activation of hip and knee extensors on LLE with verbal and tactile cues x 30  minutes  Therapy/Group: Individual Therapy  [name] [facility] 07/06/2011, 11:51 AM

## 2011-07-07 LAB — GLUCOSE, CAPILLARY
Glucose-Capillary: 98 mg/dL (ref 70–99)
Glucose-Capillary: 161 mg/dL — ABNORMAL HIGH (ref 70–99)
Glucose-Capillary: 111 mg/dL — ABNORMAL HIGH (ref 70–99)
Glucose-Capillary: 105 mg/dL — ABNORMAL HIGH (ref 70–99)

## 2011-07-07 MED ORDER — DOXEPIN HCL 25 MG PO CAPS
125.0000 mg | ORAL_CAPSULE | Freq: Every day | ORAL | Status: DC
  Administered 2011-07-07 – 2011-07-11 (×5): 125 mg via ORAL
  Filled 2011-07-07 (×6): qty 1

## 2011-07-07 NOTE — Progress Notes (Signed)
Physical Therapy Session Note  Patient Details  Name: [name] MRN: [id number] Date of Birth: May 11, 1951  Today's Date: 07/07/2011 Time: 0902-1002 Time Calculation (min): 60 min  Precautions: Precautions Precautions: Fall Precaution Comments: Contact Precautions Required Braces or Orthoses: Yes (diabetic shoes have been ordered per pt) Restrictions Weight Bearing Restrictions: No  Pain Pain Assessment Pain Assessment: No/denies pain Pain Score: 0-No pain Mobility Transfers Sit to Stand: 5: Supervision Sit to Stand Details: Verbal cues for precautions/safety Sit to Stand Details (indicate cue type and reason): cues for safe hand placement Stand to Sit: 5: Supervision Stand to Sit Details (indicate cue type and reason): Verbal cues for precautions/safety Stand to Sit Details: cues for safe hand placement Stand Pivot Transfers: 5: Supervision Stand Pivot Transfer Details: Verbal cues for sequencing;Verbal cues for precautions/safety Stand Pivot Transfer Details (indicate cue type and reason): Stand pivot car transfer with RW with cues for safe hand placement and car transfer sequence.  Patient did require min A to bring LLE out of car secondary to decreased knee flexion Locomotion  Ambulation Ambulation: Yes Ambulation/Gait Assistance: 5: Supervision Assistive device: Rolling walker Ambulation/Gait Assistance Details: Verbal cues for technique Ambulation/Gait Assistance Details (indicate cue type and reason): Gait on level surface in narrow, crowded and distracting environment (gift shop) with focus on maintaining increased step/stride length, obstacle negotiation and side stepping through narrow spaces Stairs / Additional Locomotion Stairs: Yes Stairs Assistance: 3: Mod assist Stairs Assistance Details: Tactile cues for weight shifting;Verbal cues for technique;Verbal cues for precautions/safety Stairs Assistance Details (indicate cue type and reason): mod A for LLE  placement on step, anterior weight shift onto stance LE, cues for full LE extension and safe sequence Stair Management Technique: Two rails Number of Stairs: 4  Height of Stairs: 6.5   Other Treatments Treatments Therapeutic Activity: Functional community ambulation on level surface in narrow, crowded and distracting environment (gift shop) during conversation and looking at merchandise with focus on maintaining increased step/stride length, obstacle negotiation and side stepping through narrow spaces  Therapy/Group: Individual Therapy and Co-Treatment with TR for 30 minutes in gift shop  [name] [facility] 07/07/2011, 10:15 AM

## 2011-07-07 NOTE — Progress Notes (Signed)
Occupational Therapy Note  Patient Details  Name: [name] MRN: [id number] Date of Birth: 07-Nov-1950 Today's Date: 07/07/2011  [id number] 55 min  Skilled Intervention: ADL retraining at shower level with distant S using long handled sponge. S with functional mobility RW level after bath. Good demonstratoin of safety. UB dressing with Mod I. LB Dressing with min A for socks.Using sock aid to increase Indep with socks. Not wearing shoes at this time due to edema. Given reacher bag. Excellent progress.  Individual session.  2nd session: 1400 - 1445 45 min  Skilled Interevntion: Focus on safety, balance and mobility during cooking activity. Pt made brownies using walker bag to help transport items. Cues to steady self at counter due to poor sensation of feet. Discussed need to modify home to increase her safety after D/C. Excellent participation. Pt enjoyed activity. Individual session.     Saharsh Sterling,HILLARY 07/07/2011, 4:27 PM

## 2011-07-07 NOTE — Progress Notes (Signed)
Social Work Patient ID: [name], female   DOB: October 15, 1950, 60 y.o.   MRN: [id number]   Met with pt to discuss team conference and her discharge needs. Agreeable to DME-3in1, tub bench. Also Home health follow up, she has no preference. Referral made to [facility] for dme to Be delivered to pt's room. Also for PT, OT, RN, and SW to follow at discharge. Trying to pursue [employer] And coverage for pt.  She reports feeling like she has made good progress and feels more steady on her feet. Will contact Niece to schedule family ed and inform of discharge, 11/26.

## 2011-07-07 NOTE — Progress Notes (Signed)
Patient is alert and oriented x3. She did not request any pain medication this shift. Contact isolation precautions observed. See MAR for sliding scale insulin used. Patient has been continent of bowel and bladder this shift. Continue current plan of care.

## 2011-07-07 NOTE — Progress Notes (Signed)
Patient ID: [name], female   DOB: [birth year], 60 y.o.   MRN: [id number] Patient ID: [name], female   DOB: 1950-11-19, 60 y.o.   MRN: [id number]    Subjective/Complaints: No new complaints. Slept well. No pain, SOB or CP  Objective: Vital Signs: Blood pressure 93/60, pulse 72, temperature 97.7 F (36.5 C), temperature source Oral, resp. rate 18, height 5' (1.524 m), weight 90 kg (198 lb 6.6 oz), SpO2 96.00%. No results found. No results found for this basename: WBC:2,HGB:2,HCT:2,PLT:2 in the last 72 hours  Basename 07/05/11 0655  NA 138  K 3.5  CL 105  CO2 25  GLUCOSE 129*  BUN 15  CREATININE 0.74  CALCIUM 9.0    Physical Exam: General appearance: alert, cooperative and appears older than stated age Resp: clear to auscultation bilaterally Cardio: regular rate and rhythm GI: soft, non-tender; bowel sounds normal; no masses,  no organomegaly Extremities: Stasis changed BLE.  Cellulitis LLE improving with dry dressing on ulcer.  Continues with sensitivity         Left calf>left shin and bilateral feet. Skin: diabetic ulcers bilateral feet due to pressure and left shin Neurologic: alert and oriented X 3, LLE weakness.  Neuropathy BLE.   Assessment/Plan:  1. Functional deficits secondary to Fall, rhabdomyolysis, severe diabetic neuropathy which require 3+ hours per day of interdisciplinary therapy in a comprehensive inpatient rehab setting. Physiatrist is providing close team supervision and 24 hour management of active medical problems listed below. Physiatrist and rehab team continue to assess barriers to discharge/monitor patient progress toward functional and medical goals. Team Conference today Mobility: Bed Mobility Bed Mobility: Yes Rolling Right: 6: Modified independent (Device/Increase time) Right Sidelying to Sit: 6: Modified independent (Device/Increase time) Supine to Sit: 5: Supervision Supine to Sit Details (indicate cue type and reason): Flat bed, no  rail; pt required cues for sequence and safety secondary to attempting to pull on rail and therapist  Sit to Supine - Right: 3: Mod assist (A to lift both legs) Transfers Transfers: Yes Sit to Stand: 5: Supervision;With upper extremity assist;From chair/3-in-1 Sit to Stand Details (indicate cue type and reason): cues needed for safe hand placement on surface transferring from and then reaching for RW Stand to Sit: 5: Supervision Stand to Sit Details: cues for hand placment on surface transferring to Stand Pivot Transfers: 5: Supervision Stand Pivot Transfer Details (indicate cue type and reason): Cues needed for safe hand placement during stand pivot transfer to toilet  Ambulation/Gait Ambulation/Gait Assistance: 5: Supervision Ambulation/Gait Assistance Details (indicate cue type and reason): Gait over level carpet and tile with RW and supervision with improved anterior translation of COG and lateral weight shifting and acceptance on LLE for improved stance time on L and increased step and stride length bilaterally. Ambulation Distance (Feet): 150 Feet Assistive device: Rolling walker Gait Pattern: Decreased step length - right;Decreased stride length;Decreased dorsiflexion - left;Decreased weight shift to left Gait velocity: 0.35 (With RW, cues to walk as fast as she can comfortably.)) Stairs: Yes Stairs Assistance: 3: Mod assist Stairs Assistance Details (indicate cue type and reason): Required mod A to advance each LE and clear toes, for anterior weight shift onto stance LE, eccentric control for descent and cues for safe sequence Stair Management Technique: Two rails Number of Stairs: 10  Height of Stairs: 6  Wheelchair Mobility Wheelchair Mobility: No Wheelchair Assistance: 4: Min Lexicographer: Both upper extremities Wheelchair Parts Management: Needs assistance Distance: 150 ADL:   Cognition: Cognition Overall Cognitive Status:  Impaired at  baseline Arousal/Alertness: Awake/alert Orientation Level: Oriented to person;Oriented to place Attention: Alternating Alternating Attention: Appears intact Memory: Appears intact Awareness: Appears intact Problem Solving: Appears intact Executive Function:  (appears WFL for basic ADL) Safety/Judgment: Appears intact Cognition Arousal/Alertness: Awake/alert Orientation Level: Oriented to person;Oriented to place      Medical Problem List and Plan:  1. DVT Prophylaxis/Anticoagulation: Pharmaceutical: Lovenox  2. Pain Management: prn Vicodin effective. 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  If progressive would repeat dopplers, cont lovenox and analgesics. 3. Mood: monitor for now. Very motivated to get better. Insomnia increase Sinequan dose to home dose of 125mg   Patient Active Hospital Problem List:  4. Rhabdomyolysis:  Assessment: CK improving recheck levels next week.  Renal status normalized today Plan:  Continue to push po fluids. Resume metformin.   5. Diabetes:  Assessment: blood sugars poorly controlled due to stress dose steriods. Prednisone decreased to home dose today.  Plan: Monitor with AC/HS CBG checks. Continue lantus bid. Increase meal coverge for tighter control.  Resume metformin. 6. HTN:  Assessment:resonalble control currently. Ace added due to DM/ for renal protection today.  Plan: Monitor renal status with ARB on board. Off lasix and spironolactone currently. Resume clonidine for schizophrenia.  7.OSA:  Assessment:compliant with CPAP. Slept better last night.  Plan: Continue CPAP. Monitor sleep patterns for now.  8. RA:  Assessment: Symptoms controlled on home regimen.  Plan: Continue hydroxychloroquine, methotrexate, folic acid and prednisone.  9. Cellulitis:  Assessment: Erythema and tenderness improving. IV vancomycin stopped today.  Plan: Recommendations to continue antibiotic for 14  days. On Doxy.  Off diuretics which will likely cause increase in peripheral edema with increased activity. Need to monitor for now. ? Order diabetic shoes.  10. Right chest wall pain Assessment: Musculoskeletal in nature. Chest xray negative for rib fracture.  Plan: Continue lidocaine patch as this is effective.  11. Hypokalemia:  Assessment: Resolved with supplement.  Plan: Will hold supplement for now.  12. Gout:  Assessment: Monitor for symptoms with rhabdomyolysis.  Plan: Continue allopurinol.  13. BLE weakness:  Assessment: Multifactorial due to neuropathy, fibromyalgia, RA and deconditioning. ? Statin related.  Continue to hold statin and monitor. Plan: Physical therapy for strengthening. Reports LLE pain due to tightness/spasms.  16. Vitamin D deficiency:  Assessment: Low vitamin D levels @ 26. With ongoing weakness. Started on supplement  Plan: Continue 50,000 units weekly for now.  76. Asthma/ COPD hx:  Assessment: Cough.  Decreased wheezing  Plan: Continue advair. May need nebs scheduled. Tessalon helping with cough symptoms.  18. Constipation:  Assessment: Multiple stools with Miralax.  Plan: Decrease to once a day. Monitor stooling For C diff symptoms as antibiotics on board.  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  Plan: Continue doxepin, lexapro, and clonidine (helps with catatonia)  11.  Anemia: better  Continue iron. 12. Remote CVA with Left hemiparesis     Charlett Blake 07/07/2011, 9:47 AM

## 2011-07-07 NOTE — Progress Notes (Signed)
Patient Details  Name: [name] MRN: [id number] Date of Birth: 15-Dec-1950  Today's Date: 07/07/2011 Time:   9:00-9:30   Skilled Therapeutic Interventions/Progress Updates: Pt ambulated using RW in community environment negotiating obstacles and reaching for items on shelves with supervision.  Pt required extra time to complete task safely.  Pt stated that she enjoyed the activity.  No c/o pain.   Therapy/Group: Other: co-treat with PT  Activity Level: Moderate:  Level of assist: Supervision  Karlina Suares 07/07/2011, 11:56 AM

## 2011-07-07 NOTE — Patient Care Conference (Signed)
Inpatient RehabilitationTeam Conference Note Date: 07/07/2011   Time: 4:51 PM    Patient Name: [name]      Medical Record Number: [id number]  Date of Birth: 1951/07/25 Sex: Female         Room/Bed: 4034/4034-01 Payor Info: Payor: MEDICAID [place]  Plan: MEDICAID [place] ACCESS  Product Type: *No Product type*     Admitting Diagnosis: DECONDITIONED  Admit Date/Time:  07/01/2011  2:16 PM Admission Comments: No comment available   Primary Diagnosis:  Physical deconditioning Principal Problem: Physical deconditioning  Patient Active Problem List  Diagnoses Date Noted  . Physical deconditioning 07/01/2011  . Weakness 07/01/2011  . Diabetic foot ulcer 05/19/2011  . Polypharmacy 02/09/2011  . BACK PAIN WITH RADICULOPATHY 05/25/2010  . DIABETES MELLITUS, TYPE I, UNCONTROLLED 08/18/2009  . DIASTOLIC HEART FAILURE, CHRONIC 04/25/2009  . ANEMIA 04/14/2009  . SLEEP APNEA 12/11/2008  . 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 DIABETIC PERIPHERAL NEUROPATHY 05/04/2007  . HYPERCHOLESTEROLEMIA 10/13/2006  . Gout, unspecified 10/13/2006  . HYPOKALEMIA 10/13/2006  . OBESITY, NOS 10/13/2006  . SCHIZOPHRENIA 10/13/2006  . DEPRESSIVE DISORDER, NOS 10/13/2006  . HYPERTENSION, BENIGN SYSTEMIC 10/13/2006  . VENOUS INSUFFICIENCY, CHRONIC 10/13/2006  . RHINITIS, ALLERGIC 10/13/2006  . ASTHMA, PERSISTENT 10/13/2006  . Reflux esophagitis 10/13/2006  . OSTEOARTHRITIS, MULTI SITES 10/13/2006  . INCONTINENCE, URGE 10/13/2006    Expected Discharge Date: Expected Discharge Date: 07/12/11  Team Members Present: Physician: Dr. Alysia Penna Case Manager Present: Jesse Fall, RN Social Worker Present: Ovidio Kin, LCSW PT Present: [name], PT OT Present: Antony Salmon, OT RN: Altamese Dilling, RN     Current Status/Progress Goal Weekly Team Focus  Medical   Hallucinates, poor sleep  adjust meds  keep her from being alone , frequent day room   Bowel/Bladder   patient is continent of  bowel and bladder.  patient will remain continent of bowel and bladder. she will have a BM every 2 days.  staff will assist patient to bathroom to maintain contience   Swallow/Nutrition/ Hydration             ADL's   S bathing. S dressing with the exception of Min A LB dressing due to edematous feet/LE  overall Mod I.   family education. training with AE.   Mobility   supervision--min A overall  mod I except supervision on stairs  endurance, gait sequence   Communication             Safety/Cognition/ Behavioral Observations            Pain   patient complains of pain in her legs.  patient's will have pain <2.  staff will assess pain level every shift and prn.   Skin   patient has multiple areas of skin breakdown with dressings.  patient will have no further skin breakdown during this admission.  staff will cleanse wounds daily and apply new dressings.      *See Interdisciplinary Assessment and Plan and progress notes for long and short-term goals  Barriers to Discharge: Very poor social situation    Possible Resolutions to Barriers:  family training, ?diabetic shoes from [facility]    Discharge Planning/Teaching Needs:  Pt to go home with Niece and Niece's boyfrined. Someone with in the evenings, alone during the day. Looking into [employer] and if [employer] covers it      Team Discussion:  Discussed pt's dx, hx, medical issues, goals, d/c plan. Using ACE wraps for now on (b)LEs; to use TEDs later. Pt needs  DM shoes. Pt has memory deficits. Niece to come for education. D?C set for 07/12/11.  Revisions to Treatment Plan:     Continued Need for Acute Rehabilitation Level of Care: The patient requires daily medical management by a physician with specialized training in physical medicine and rehabilitation for the following conditions: Daily direction of a multidisciplinary physical rehabilitation program to ensure safe treatment while eliciting the highest outcome that is of  practical value to the patient.: Yes Daily medical management of patient stability for increased activity during participation in an intensive rehabilitation regime.: Yes Daily analysis of laboratory values and/or radiology reports with any subsequent need for medication adjustment of medical intervention for : Neurological problems  Case Manager informed pt of Team Conference: goals and d/c date. Called pt's niece and informed her of above. Both in agreement with plan. Flo Shanks 07/07/2011, 4:51 PM

## 2011-07-07 NOTE — Progress Notes (Signed)
Note made for [facility].  Patient was fitted with extra depth shoes. Patient was instructed on shoes operation and function. She verbalized that she will follow wear instructions. Patient satisfied with fit.   Contact [facility] [phone] Fit done by Brooke Pace.

## 2011-07-07 NOTE — Progress Notes (Signed)
Physical Therapy Session Note  Patient Details  Name: [name] MRN: [id number] Date of Birth: 1951/01/27  Today's Date: 07/07/2011 Time: [id number] Time Calculation (min): 51 min  Precautions: Precautions Precautions: Fall Precaution Comments: fall Required Braces or Orthoses: Yes (ace wrap for ulcer and diabetic shoes ordered per pt) Restrictions Weight Bearing Restrictions: No  General Chart Reviewed: Yes Pain Pain Assessment Pain Assessment: 0-10 Pain Score:  (9); nurse notified Pain Type: Chronic pain Pain Location: Ankle Pain Orientation: Left Pain Descriptors: Aching;Constant;Throbbing;Tightness;Tender;Sore; Cramping Pain Onset: On-going Multiple Pain Sites: No Mobility Bed Mobility Bed Mobility: No Transfers Transfers: Yes Sit to Stand: 5: Supervision Sit to Stand Details: Verbal cues for precautions/safety Sit to Stand Details (indicate cue type and reason): vc's for safe hand placement Stand to Sit: 5: Supervision Stand to Sit Details (indicate cue type and reason): Verbal cues for precautions/safety Stand to Sit Details: vc's for safe hand placement Locomotion  Ambulation Ambulation/Gait Assistance: 5: Supervision Ambulation Distance (Feet): >150 Feet x 2 Assistive device: Rolling walker Ambulation/Gait Assistance Details: Verbal cues for technique Ambulation/Gait Assistance Details (indicate cue type and reason): Gait >153ft supervision with RW, focusing on having pt watch feet and use the visual feedback to obtain wider base of support in order to decrease risk of falling during ambulation.  Vc's ~50% for wider base of support to increase safety awareness. Stairs / Additional Locomotion Stairs: Yes Stairs Assistance: 3: Mod assist Stairs Assistance Details: Manual facilitation for placement;Manual facilitation for weight bearing;Verbal cues for precautions/safety;Verbal cues for sequencing;Verbal cues for technique Stairs Assistance Details (indicate  cue type and reason): mod A for LLE placement on step and bilat weight shifting Stair Management Technique: Two rails;Step to pattern;Sideways;Forwards (forwards up/sideways down) Number of Stairs: 6  (3 up/3 down) Height of Stairs: 4  (4") Wheelchair Mobility Wheelchair Mobility: No   Exercises Standing L calf stretch (3 x 30sec) using RW and PT assist in front of mirror for visual feedback to increase L ankle ROM in order to increase functional mobility and reduce pain due to cramping in L calf. Seated AAROM hip flexion x 10 reps for increased functional strength.  Therapy/Group: Individual Therapy  Elias Else 07/07/2011, 4:28 PM

## 2011-07-08 LAB — GLUCOSE, CAPILLARY
Glucose-Capillary: 120 mg/dL — ABNORMAL HIGH (ref 70–99)
Glucose-Capillary: 143 mg/dL — ABNORMAL HIGH (ref 70–99)
Glucose-Capillary: 130 mg/dL — ABNORMAL HIGH (ref 70–99)
Glucose-Capillary: 177 mg/dL — ABNORMAL HIGH (ref 70–99)

## 2011-07-08 LAB — CREATININE, SERUM
Creatinine, Ser: 0.78 mg/dL (ref 0.50–1.10)
GFR calc Af Amer: >90 mL/min (ref >90)
GFR calc non Af Amer: 90 mL/min — ABNORMAL LOW (ref >90)

## 2011-07-08 NOTE — Progress Notes (Signed)
Patient ID: [name], female   DOB: 02-07-1951, 60 y.o.   MRN: [id number]    Subjective/Complaints: No new complaints. Slept well. No pain, SOB or CP  Objective: Vital Signs: Blood pressure 100/64, pulse 71, temperature 97.5 F (36.4 C), temperature source Oral, resp. rate 18, height 5' (1.524 m), weight 91 kg (200 lb 9.9 oz), SpO2 100.00%. No results found. No results found for this basename: WBC:2,HGB:2,HCT:2,PLT:2 in the last 72 hours  Basename 07/08/11 0730  NA --  K --  CL --  CO2 --  GLUCOSE --  BUN --  CREATININE 0.78  CALCIUM --    Physical Exam: General appearance: alert, cooperative and appears older than stated age Resp: clear to auscultation bilaterally Cardio: regular rate and rhythm GI: soft, non-tender; bowel sounds normal; no masses,  no organomegaly Extremities: Stasis changed BLE.  Cellulitis LLE improving with dry dressing on ulcer.  Continues with sensitivity         Left calf>left shin and bilateral feet. Skin: diabetic ulcers bilateral feet due to pressure and left shin Neurologic: alert and oriented X 3, LLE weakness.  Neuropathy BLE.   Assessment/Plan:  1. Functional deficits secondary to Fall, rhabdomyolysis, severe diabetic neuropathy which require 3+ hours per day of interdisciplinary therapy in a comprehensive inpatient rehab setting. Physiatrist is providing close team supervision and 24 hour management of active medical problems listed below. Physiatrist and rehab team continue to assess barriers to discharge/monitor patient progress toward functional and medical goals. Team Conference today Mobility: Bed Mobility Bed Mobility: No Rolling Right: 6: Modified independent (Device/Increase time) Right Sidelying to Sit: 6: Modified independent (Device/Increase time) Supine to Sit: 5: Supervision Supine to Sit Details (indicate cue type and reason): Flat bed, no rail; pt required cues for sequence and safety secondary to attempting to pull on  rail and therapist  Sit to Supine - Right: 3: Mod assist (A to lift both legs) Transfers Transfers: Yes Sit to Stand: 5: Supervision Sit to Stand Details (indicate cue type and reason): vc's for safe hand placement Stand to Sit: 5: Supervision Stand to Sit Details: vc's for safe hand placement Stand Pivot Transfers: 5: Supervision Stand Pivot Transfer Details (indicate cue type and reason): Stand pivot car transfer with RW with cues for safe hand placement and car transfer sequence.  Patient did require min A to bring LLE out of car secondary to decreased knee flexion Ambulation/Gait Ambulation/Gait Assistance: 5: Supervision Ambulation/Gait Assistance Details (indicate cue type and reason): vc's given for wider base of support Ambulation Distance (Feet): 150 Feet (>170ft) Assistive device: Rolling walker Gait Pattern: Decreased step length - right;Decreased stride length;Decreased dorsiflexion - left;Decreased weight shift to left Gait velocity: 0.35 (With RW, cues to walk as fast as she can comfortably.)) Stairs: Yes Stairs Assistance: 3: Mod assist Stairs Assistance Details (indicate cue type and reason): mod A for LLE placement on step Stair Management Technique: Two rails;Step to pattern;Sideways;Forwards (forwards up/sideways down) Number of Stairs: 6  (3 up/3 down) Height of Stairs: 4  (4") Wheelchair Mobility Wheelchair Mobility: No Wheelchair Assistance: 4: Energy manager: Both upper extremities Wheelchair Parts Management: Needs assistance Distance: 150 ADL:   Cognition: Cognition Overall Cognitive Status: Impaired at baseline Arousal/Alertness: Awake/alert Orientation Level: Oriented X4 Attention: Alternating Alternating Attention: Appears intact Memory: Appears intact Awareness: Appears intact Problem Solving: Appears intact Executive Function:  (appears WFL for basic ADL) Safety/Judgment: Appears intact Cognition Arousal/Alertness:  Awake/alert Orientation Level: Oriented X4      Medical Problem  List and Plan:  1. DVT Prophylaxis/Anticoagulation: Pharmaceutical: Lovenox  2. Pain Management: prn Vicodin effective. 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  If progressive would repeat dopplers, cont lovenox and analgesics. 3. Mood: monitor for now. Very motivated to get better. Insomnia increase Sinequan dose to home dose of 125mg   Patient Active Hospital Problem List:  4. Rhabdomyolysis:  Assessment: CK improving recheck levels next week.  Renal status normalized today Plan:  Continue to push po fluids. Resume metformin.   5. Diabetes:  Assessment: blood sugars poorly controlled due to stress dose steriods. Prednisone decreased to home dose today.  Plan: Monitor with AC/HS CBG checks. Continue lantus bid. Increase meal coverge for tighter control.  Resume metformin. 6. HTN:  Assessment:resonalble control currently. Ace added due to DM/ for renal protection today.  Plan: Monitor renal status with ARB on board. Off lasix and spironolactone currently. Resume clonidine for schizophrenia.  7.OSA:  Assessment:compliant with CPAP. Slept better last night.  Plan: Continue CPAP. Monitor sleep patterns for now.  8. RA:  Assessment: Symptoms controlled on home regimen.  Plan: Continue hydroxychloroquine, methotrexate, folic acid and prednisone.  9. Cellulitis:  Assessment: Erythema and tenderness improving. IV vancomycin stopped today.  Plan: Recommendations to continue antibiotic for 14 days. On Doxy.  Off diuretics which will likely cause increase in peripheral edema with increased activity. Need to monitor for now. ? Order diabetic shoes.  10. Right chest wall pain Assessment: Musculoskeletal in nature. Chest xray negative for rib fracture.  Plan: Continue lidocaine patch as this is effective.  11. Hypokalemia:  Assessment: Resolved with supplement.  Plan:  Will hold supplement for now.  12. Gout:  Assessment: Monitor for symptoms with rhabdomyolysis.  Plan: Continue allopurinol.  13. BLE weakness:  Assessment: Multifactorial due to neuropathy, fibromyalgia, RA and deconditioning. ? Statin related.  Continue to hold statin and monitor. Plan: Physical therapy for strengthening. Reports LLE pain due to tightness/spasms.  16. Vitamin D deficiency:  Assessment: Low vitamin D levels @ 26. With ongoing weakness. Started on supplement  Plan: Continue 50,000 units weekly for now.  97. Asthma/ COPD hx:  Assessment: Cough.  Decreased wheezing  Plan: Continue advair. May need nebs scheduled. Tessalon helping with cough symptoms.  18. Constipation:  Assessment: Multiple stools with Miralax.  Plan: Decrease to once a day. Monitor stooling For C diff symptoms as antibiotics on board.  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  Plan: Continue doxepin, lexapro, and clonidine (helps with catatonia)  11.  Anemia: better  Continue iron. 12. Remote CVA with Left hemiparesis     Charlett Blake 07/08/2011, 9:14 AM

## 2011-07-08 NOTE — Progress Notes (Signed)
Patient is alert and oriented x3. She complained of leg pain and was given pain meds x1 which provided relief. Blood glucose this shift has required sliding scale insulin. See MAR. Dressings changed to wounds as ordered. paatient has been continent of bowel and bladder. Continue current plan of care.

## 2011-07-09 LAB — GLUCOSE, CAPILLARY
Glucose-Capillary: 127 mg/dL — ABNORMAL HIGH (ref 70–99)
Glucose-Capillary: 159 mg/dL — ABNORMAL HIGH (ref 70–99)
Glucose-Capillary: 118 mg/dL — ABNORMAL HIGH (ref 70–99)
Glucose-Capillary: 101 mg/dL — ABNORMAL HIGH (ref 70–99)

## 2011-07-09 MED ORDER — LIDOCAINE 5 % EX PTCH
1.0000 | MEDICATED_PATCH | CUTANEOUS | Status: DC
  Administered 2011-07-09 – 2011-07-11 (×3): 1 via TRANSDERMAL
  Filled 2011-07-09 (×4): qty 1

## 2011-07-09 NOTE — Progress Notes (Signed)
Physical Therapy Weekly Progress Note  Patient Details  Name: [name] MRN: [id number] Date of Birth: 1950-12-21 Today's Date: 07/09/2011  Patient has met 1 of 7 long term goals.  Short term goals not set due to estimated length of stay.  Patient is scheduled to D/C home with intermittent supervision and assistance of niece and niece's boyfriend on Monday 07/12/11 after therapy.  Patient has made good progress toward LTG.  She is wearing TEDs for edema management and Diabetic shoes which she reports has helped significantly with pain and is currently mod I bed mobility and supervision for basic transfers but continues to require verbal cues for safe hand and L foot placement, supervision for ambulation >150' on level surfaces with RW with improved gait speed and step through gait sequence, min A for car transfers for assistance to place LLE into and out of car, min A for stairs secondary to LE weakness and still requiring cues for safe sequence.  Patient continues to demonstrate the following deficits: LE ROM, strength, balance, activity tolerance, gait speed and sequence and sequencing and therefore will continue to benefit from skilled PT intervention to enhance overall performance with activity tolerance, balance and stairs and household ambulation with RW.  See Patient's Care Plan for progression toward long term goals.  Patient progressing toward long term goals..  Continue plan of care.  [name] Faucette 07/09/2011, 1:59 PM

## 2011-07-09 NOTE — Progress Notes (Signed)
Occutional Therapy Note  Patient Details  Name: [name] MRN: [id number] Date of Birth: 1951-07-23 Today's Date: 07/09/2011  Time: 1035 - 1135 60 min  Skilled Intervention: ADL retraining at shower level in apt. Min A for transfer to tub to left leg over into tub. Pt S with lifting leg out of tub using leg lifter. S with B/D with the exception of assistance for TEDS. Wearing new diabetic shoes and able to fasten indep. Min cues for safety. Good anticipatory awareness.Excellent participation.  Individual session  2nd session: Time: 1300 - 1345 45 min  Skilled Intervention: Laundry task to focus on functional mobility at [employer] level. S overall with task for safety during dynamic standing activities. Cues to use reacher to retrieve items from lower surfaces. S for ambulation. Making excellent progress.  Individual session   Kirby 07/09/2011, 12:24 PM

## 2011-07-09 NOTE — Progress Notes (Signed)
Patient ID: [name], female   DOB: [birth year], 60 y.o.   MRN: [id number]    Subjective/Complaints: No new complaints. Nurse at bedside.Slept well  Objective: Vital Signs: Blood pressure 107/72, pulse 82, temperature 98.2 F (36.8 C), temperature source Oral, resp. rate 18, height 5' (1.524 m), weight 91 kg (200 lb 9.9 oz), SpO2 97.00%. No results found. No results found for this basename: WBC:2,HGB:2,HCT:2,PLT:2 in the last 72 hours  Basename 07/08/11 0730  NA --  K --  CL --  CO2 --  GLUCOSE --  BUN --  CREATININE 0.78  CALCIUM --    Physical Exam: General appearance: alert, cooperative and appears older than stated age Resp: clear to auscultation bilaterally Cardio: regular rate and rhythm GI: soft, non-tender; bowel sounds normal; no masses,  no organomegaly Extremities: Stasis changed BLE.  Cellulitis LLE improving with dry dressing on ulcer.  Continues with sensitivity         Left calf>left shin and bilateral feet. Skin: diabetic ulcers bilateral feet due to pressure and left shin Neurologic: alert and oriented X 3, LLE weakness.  Neuropathy BLE. Insensate feet  Assessment/Plan:  1. Functional deficits secondary to Fall, rhabdomyolysis, severe diabetic neuropathy which require 3+ hours per day of interdisciplinary therapy in a comprehensive inpatient rehab setting. Physiatrist is providing close team supervision and 24 hour management of active medical problems listed below. Physiatrist and rehab team continue to assess barriers to discharge/monitor patient progress toward functional and medical goals. Team Conference today Mobility: Bed Mobility Bed Mobility: No Rolling Right: 6: Modified independent (Device/Increase time) Right Sidelying to Sit: 6: Modified independent (Device/Increase time) Supine to Sit: 5: Supervision Supine to Sit Details (indicate cue type and reason): Flat bed, no rail; pt required cues for sequence and safety secondary to attempting to  pull on rail and therapist  Sit to Supine - Right: 3: Mod assist (A to lift both legs) Transfers Transfers: Yes Sit to Stand: 5: Supervision Sit to Stand Details (indicate cue type and reason): vc's for safe hand placement Stand to Sit: 5: Supervision Stand to Sit Details: vc's for safe hand placement Stand Pivot Transfers: 5: Supervision Stand Pivot Transfer Details (indicate cue type and reason): Stand pivot car transfer with RW with cues for safe hand placement and car transfer sequence.  Patient did require min A to bring LLE out of car secondary to decreased knee flexion Ambulation/Gait Ambulation/Gait Assistance: 5: Supervision Ambulation/Gait Assistance Details (indicate cue type and reason): vc's given for wider base of support Ambulation Distance (Feet): 150 Feet (>117ft) Assistive device: Rolling walker Gait Pattern: Decreased step length - right;Decreased stride length;Decreased dorsiflexion - left;Decreased weight shift to left Gait velocity: 0.35 (With RW, cues to walk as fast as she can comfortably.)) Stairs: Yes Stairs Assistance: 3: Mod assist Stairs Assistance Details (indicate cue type and reason): mod A for LLE placement on step Stair Management Technique: Two rails;Step to pattern;Sideways;Forwards (forwards up/sideways down) Number of Stairs: 6  (3 up/3 down) Height of Stairs: 4  (4") Wheelchair Mobility Wheelchair Mobility: No Wheelchair Assistance: 4: Energy manager: Both upper extremities Wheelchair Parts Management: Needs assistance Distance: 150 ADL:   Cognition: Cognition Overall Cognitive Status: Impaired at baseline Arousal/Alertness: Awake/alert Orientation Level: Oriented X4 Attention: Alternating Alternating Attention: Appears intact Memory: Appears intact Awareness: Appears intact Problem Solving: Appears intact Executive Function:  (appears WFL for basic ADL) Safety/Judgment: Appears intact Cognition Arousal/Alertness:  Awake/alert Orientation Level: Oriented X4      Medical Problem List and  Plan:  1. DVT Prophylaxis/Anticoagulation: Pharmaceutical: Lovenox  2. Pain Management: prn Vicodin effective. 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  If progressive would repeat dopplers, cont lovenox and analgesics. 3. Mood: monitor for now. Very motivated to get better. Insomnia increase Sinequan dose to home dose of 125mg   Patient Active Hospital Problem List:  4. Rhabdomyolysis:  Assessment: CK improving recheck levels next week.  Renal status normalized today Plan:  Continue to push po fluids. Resume metformin.   5. Diabetes: Lantus/glucophage Assessment: check blood sugars ac/hs.Monitor closely while on steroid.  6. HTN: clonidine 0.4 mg qhs  No orthostasis. Monitor closely when up with activity.   7.OSA:  Assessment:compliant with CPAP. Slept better last night.  Plan: Continue CPAP. Monitor sleep patterns for now.  8. RA:  Assessment: Symptoms controlled on home regimen.  Plan: Continue hydroxychloroquine, methotrexate, folic acid and prednisone.  9. Cellulitis:  Assessment: Erythema and tenderness improving. IV vancomycin stopped today.  Plan: Recommendations to continue antibiotic for 14 days. On Doxy.  Off diuretics which will likely cause increase in peripheral edema with increased activity. Need to monitor for now. ? Order diabetic shoes.  10. Right chest wall pain Assessment: Musculoskeletal in nature. Chest xray negative for rib fracture.  Plan: Continue lidocaine patch as this is effective.  11. Hypokalemia:  Assessment: Resolved with supplement.  Plan: Will hold supplement for now.  12. Gout:  Assessment: Monitor for symptoms with rhabdomyolysis.  Plan: Continue allopurinol.  13. BLE weakness:  Assessment: Multifactorial due to neuropathy, fibromyalgia, RA and deconditioning. ? Statin related.  Continue to hold statin and  monitor. Plan: Physical therapy for strengthening. Reports LLE pain due to tightness/spasms.  16. Vitamin D deficiency:  Assessment: Low vitamin D levels @ 26. With ongoing weakness. Started on supplement  Plan: Continue 50,000 units weekly for now.  65. Asthma/ COPD hx:  Assessment: Cough.  Decreased wheezing  Plan: Continue advair. May need nebs scheduled. Tessalon helping with cough symptoms.  18. Constipation:  Assessment: Multiple stools with Miralax.  Plan: Decrease to once a day. Monitor stooling For C diff symptoms as antibiotics on board.  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  Plan: Continue doxepin, lexapro, and clonidine (helps with catatonia)  11.  Anemia: better  Continue iron. 12. Remote CVA with Left hemiparesis     ANGIULLI,DANIEL J. 07/09/2011, 6:37 AM              Patient ID: [name], female   DOB: 09-24-1950, 60 y.o.   MRN: [id number]    Subjective/Complaints: No new complaints. Slept well. No pain, SOB or CP  Objective: Vital Signs: Blood pressure 107/72, pulse 82, temperature 98.2 F (36.8 C), temperature source Oral, resp. rate 18, height 5' (1.524 m), weight 91 kg (200 lb 9.9 oz), SpO2 97.00%. No results found. No results found for this basename: WBC:2,HGB:2,HCT:2,PLT:2 in the last 72 hours  Basename 07/08/11 0730  NA --  K --  CL --  CO2 --  GLUCOSE --  BUN --  CREATININE 0.78  CALCIUM --    Physical Exam: General appearance: alert, cooperative and appears older than stated age Resp: clear to auscultation bilaterally Cardio: regular rate and rhythm GI: soft, non-tender; bowel sounds normal; no masses,  no organomegaly Extremities: Stasis changed BLE.  Cellulitis LLE improving with dry dressing on ulcer.  Continues with sensitivity  Left calf>left shin and bilateral feet. Skin: diabetic ulcers bilateral feet due to pressure and left shin Neurologic: alert  and oriented X 3, LLE weakness.  Neuropathy BLE.   Assessment/Plan:  1. Functional deficits secondary to Fall, rhabdomyolysis, severe diabetic neuropathy which require 3+ hours per day of interdisciplinary therapy in a comprehensive inpatient rehab setting. Physiatrist is providing close team supervision and 24 hour management of active medical problems listed below. Physiatrist and rehab team continue to assess barriers to discharge/monitor patient progress toward functional and medical goals. Team Conference today Mobility: Bed Mobility Bed Mobility: No Rolling Right: 6: Modified independent (Device/Increase time) Right Sidelying to Sit: 6: Modified independent (Device/Increase time) Supine to Sit: 5: Supervision Supine to Sit Details (indicate cue type and reason): Flat bed, no rail; pt required cues for sequence and safety secondary to attempting to pull on rail and therapist  Sit to Supine - Right: 3: Mod assist (A to lift both legs) Transfers Transfers: Yes Sit to Stand: 5: Supervision Sit to Stand Details (indicate cue type and reason): vc's for safe hand placement Stand to Sit: 5: Supervision Stand to Sit Details: vc's for safe hand placement Stand Pivot Transfers: 5: Supervision Stand Pivot Transfer Details (indicate cue type and reason): Stand pivot car transfer with RW with cues for safe hand placement and car transfer sequence.  Patient did require min A to bring LLE out of car secondary to decreased knee flexion Ambulation/Gait Ambulation/Gait Assistance: 5: Supervision Ambulation/Gait Assistance Details (indicate cue type and reason): vc's given for wider base of support Ambulation Distance (Feet): 150 Feet (>126ft) Assistive device: Rolling walker Gait Pattern: Decreased step length - right;Decreased stride length;Decreased dorsiflexion - left;Decreased weight shift to left Gait velocity: 0.35 (With RW, cues to walk as fast as she can comfortably.)) Stairs: Yes Stairs  Assistance: 3: Mod assist Stairs Assistance Details (indicate cue type and reason): mod A for LLE placement on step Stair Management Technique: Two rails;Step to pattern;Sideways;Forwards (forwards up/sideways down) Number of Stairs: 6  (3 up/3 down) Height of Stairs: 4  (4") Wheelchair Mobility Wheelchair Mobility: No Wheelchair Assistance: 4: Energy manager: Both upper extremities Wheelchair Parts Management: Needs assistance Distance: 150 ADL:   Cognition: Cognition Overall Cognitive Status: Impaired at baseline Arousal/Alertness: Awake/alert Orientation Level: Oriented X4 Attention: Alternating Alternating Attention: Appears intact Memory: Appears intact Awareness: Appears intact Problem Solving: Appears intact Executive Function:  (appears WFL for basic ADL) Safety/Judgment: Appears intact Cognition Arousal/Alertness: Awake/alert Orientation Level: Oriented X4      Medical Problem List and Plan:  1. DVT Prophylaxis/Anticoagulation: Pharmaceutical: Lovenox  2. Pain Management: prn Vicodin effective. 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  If progressive would repeat dopplers, cont lovenox and analgesics. 3. Mood: monitor for now. Very motivated to get better. Insomnia increase Sinequan dose to home dose of 125mg   Patient Active Hospital Problem List:  4. Rhabdomyolysis:  Assessment: CK improving recheck levels next week.  Renal status normalized today Plan:  Continue to push po fluids. Resume metformin.   5. Diabetes:  Assessment: blood sugars poorly controlled due to stress dose steriods. Prednisone decreased to home dose today.  Plan: Monitor with AC/HS CBG checks. Continue lantus bid. Increase meal coverge for tighter control.  Resume metformin. 6. HTN:  Assessment:resonalble control currently. Ace added due to DM/ for renal protection today.  Plan: Monitor renal status with ARB on  board. Off lasix and spironolactone currently. Resume clonidine  for schizophrenia.  7.OSA:  Assessment:compliant with CPAP. Slept better last night.  Plan: Continue CPAP. Monitor sleep patterns for now.  8. RA:  Assessment: Symptoms controlled on home regimen.  Plan: Continue hydroxychloroquine, methotrexate, folic acid and prednisone.  9. Cellulitis:  Assessment: Erythema and tenderness improving. IV vancomycin stopped today.  Plan: Recommendations to continue antibiotic for 14 days. On Doxy.  Off diuretics which will likely cause increase in peripheral edema with increased activity. Need to monitor for now. ? Order diabetic shoes.  10. Right chest wall pain Assessment: Musculoskeletal in nature. Chest xray negative for rib fracture.  Plan: Continue lidocaine patch as this is effective.  11. Hypokalemia:  Assessment: Resolved with supplement.  Plan: Will hold supplement for now.  12. Gout:  Assessment: Monitor for symptoms with rhabdomyolysis.  Plan: Continue allopurinol.  13. BLE weakness:  Assessment: Multifactorial due to neuropathy, fibromyalgia, RA and deconditioning. ? Statin related.  Continue to hold statin and monitor. Plan: Physical therapy for strengthening. Reports LLE pain due to tightness/spasms.  16. Vitamin D deficiency:  Assessment: Low vitamin D levels @ 26. With ongoing weakness. Started on supplement  Plan: Continue 50,000 units weekly for now.  33. Asthma/ COPD hx:  Assessment: Cough.  Decreased wheezing  Plan: Continue advair. May need nebs scheduled. Tessalon helping with cough symptoms.  18. Constipation:  Assessment: Multiple stools with Miralax.  Plan: Decrease to once a day. Monitor stooling For C diff symptoms as antibiotics on board.  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  Plan: Continue doxepin, lexapro, and clonidine (helps with catatonia)  11.  Anemia: better  Continue iron. 12. Remote CVA  with Left hemiparesis     ANGIULLI,DANIEL J. 07/09/2011, 6:37 AM              Patient ID: [name], female   DOB: Feb 07, 1951, 60 y.o.   MRN: [id number]    Subjective/Complaints: No new complaints. Slept well. No pain, SOB or CP  Objective: Vital Signs: Blood pressure 107/72, pulse 82, temperature 98.2 F (36.8 C), temperature source Oral, resp. rate 18, height 5' (1.524 m), weight 91 kg (200 lb 9.9 oz), SpO2 97.00%. No results found. No results found for this basename: WBC:2,HGB:2,HCT:2,PLT:2 in the last 72 hours  Basename 07/08/11 0730  NA --  K --  CL --  CO2 --  GLUCOSE --  BUN --  CREATININE 0.78  CALCIUM --    Physical Exam: General appearance: alert, cooperative and appears older than stated age Resp: clear to auscultation bilaterally Cardio: regular rate and rhythm GI: soft, non-tender; bowel sounds normal; no masses,  no organomegaly Extremities: Stasis changed BLE.  Cellulitis LLE improving with dry dressing on ulcer.  Continues with sensitivity         Left calf>left shin and bilateral feet. Skin: diabetic ulcers bilateral feet due to pressure and left shin Neurologic: alert and oriented X 3, LLE weakness.  Neuropathy BLE.   Assessment/Plan:  1. Functional deficits secondary to Fall, rhabdomyolysis, severe diabetic neuropathy which require 3+ hours per day of interdisciplinary therapy in a comprehensive inpatient rehab setting. Physiatrist is providing close team supervision and 24 hour management of active medical problems listed below. Physiatrist and rehab team continue to assess barriers to discharge/monitor patient progress toward functional and medical goals. Team Conference today Mobility: Bed Mobility Bed Mobility: No Rolling Right: 6: Modified independent (Device/Increase time) Right Sidelying to Sit: 6: Modified independent (Device/Increase time) Supine to Sit: 5: Supervision Supine  to Sit Details (indicate cue type and reason):  Flat bed, no rail; pt required cues for sequence and safety secondary to attempting to pull on rail and therapist  Sit to Supine - Right: 3: Mod assist (A to lift both legs) Transfers Transfers: Yes Sit to Stand: 5: Supervision Sit to Stand Details (indicate cue type and reason): vc's for safe hand placement Stand to Sit: 5: Supervision Stand to Sit Details: vc's for safe hand placement Stand Pivot Transfers: 5: Supervision Stand Pivot Transfer Details (indicate cue type and reason): Stand pivot car transfer with RW with cues for safe hand placement and car transfer sequence.  Patient did require min A to bring LLE out of car secondary to decreased knee flexion Ambulation/Gait Ambulation/Gait Assistance: 5: Supervision Ambulation/Gait Assistance Details (indicate cue type and reason): vc's given for wider base of support Ambulation Distance (Feet): 150 Feet (>153ft) Assistive device: Rolling walker Gait Pattern: Decreased step length - right;Decreased stride length;Decreased dorsiflexion - left;Decreased weight shift to left Gait velocity: 0.35 (With RW, cues to walk as fast as she can comfortably.)) Stairs: Yes Stairs Assistance: 3: Mod assist Stairs Assistance Details (indicate cue type and reason): mod A for LLE placement on step Stair Management Technique: Two rails;Step to pattern;Sideways;Forwards (forwards up/sideways down) Number of Stairs: 6  (3 up/3 down) Height of Stairs: 4  (4") Wheelchair Mobility Wheelchair Mobility: No Wheelchair Assistance: 4: Energy manager: Both upper extremities Wheelchair Parts Management: Needs assistance Distance: 150 ADL:   Cognition: Cognition Overall Cognitive Status: Impaired at baseline Arousal/Alertness: Awake/alert Orientation Level: Oriented X4 Attention: Alternating Alternating Attention: Appears intact Memory: Appears intact Awareness: Appears intact Problem Solving: Appears intact Executive Function:   (appears WFL for basic ADL) Safety/Judgment: Appears intact Cognition Arousal/Alertness: Awake/alert Orientation Level: Oriented X4      Medical Problem List and Plan:  1. DVT Prophylaxis/Anticoagulation: Pharmaceutical: Lovenox  2. Pain Management: prn Vicodin effective. 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  If progressive would repeat dopplers, cont lovenox and analgesics. 3. Mood: monitor for now. Very motivated to get better. Insomnia increase Sinequan dose to home dose of 125mg   Patient Active Hospital Problem List:  4. Rhabdomyolysis:  Assessment: CK improving recheck levels next week.  Renal status normalized today Plan:  Continue to push po fluids. Resume metformin.   5. Diabetes:  Assessment: blood sugars poorly controlled due to stress dose steriods. Prednisone decreased to home dose today.  Plan: Monitor with AC/HS CBG checks. Continue lantus bid. Increase meal coverge for tighter control.  Resume metformin. 6. HTN:  Assessment:resonalble control currently. Ace added due to DM/ for renal protection today.  Plan: Monitor renal status with ARB on board. Off lasix and spironolactone currently. Resume clonidine for schizophrenia.  7.OSA:  Assessment:compliant with CPAP. Slept better last night.  Plan: Continue CPAP. Monitor sleep patterns for now.  8. RA:  Assessment: Symptoms controlled on home regimen.  Plan: Continue hydroxychloroquine, methotrexate, folic acid and prednisone.  9. Cellulitis:  Assessment: Erythema and tenderness improving. IV vancomycin stopped today.  Plan: Recommendations to continue antibiotic for 14 days. On Doxy.  Off diuretics which will likely cause increase in peripheral edema with increased activity. Need to monitor for now. ? Order diabetic shoes.  10. Right chest wall pain Assessment: Musculoskeletal in nature. Chest xray negative for rib fracture.  Plan: Continue lidocaine patch  as this is effective.  11. Hypokalemia:  Assessment: Resolved with supplement.  Plan: Will hold supplement for now.  12. Gout:  Assessment: Monitor for symptoms with rhabdomyolysis.  Plan: Continue allopurinol.  13. BLE weakness:  Assessment: Multifactorial due to neuropathy, fibromyalgia, RA and deconditioning. ? Statin related.  Continue to hold statin and monitor. Plan: Physical therapy for strengthening. Reports LLE pain due to tightness/spasms.  16. Vitamin D deficiency:  Assessment: Low vitamin D levels @ 26. With ongoing weakness. Started on supplement  Plan: Continue 50,000 units weekly for now.  33. Asthma/ COPD hx:  Assessment: Cough.  Decreased wheezing  Plan: Continue advair. May need nebs scheduled. Tessalon helping with cough symptoms.  18. Constipation:  Assessment: Multiple stools with Miralax.  Plan: Decrease to once a day. Monitor stooling For C diff symptoms as antibiotics on board.  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  Plan: Continue doxepin, lexapro, and clonidine (helps with catatonia)  11.  Anemia: better  Continue iron. 12. Remote CVA with Left hemiparesis     ANGIULLI,DANIEL J. 07/09/2011, 6:37 AM

## 2011-07-09 NOTE — Progress Notes (Signed)
Physical Therapy Session Note  Patient Details  Name: [name] MRN: [id number] Date of Birth: Jul 02, 1951  Today's Date: 07/09/2011 Time: 40- 957 and 1140-1207 Time Calculation (min):57 min and 27 min  Precautions: Precautions Precautions: Fall Precaution Comments: fall Required Braces or Orthoses: Yes TEDs and Diabetic shoes Restrictions Weight Bearing Restrictions: No  Short Term Goals: = LTG secondary to short LOS Pain Pain Assessment Pain Assessment: No/denies pain Pain Score:   1 Pain Intervention(s): Other (Comment);RN made aware (pt wished to premedicate) Mobility Bed Mobility Supine to Sit: 6: Modified independent (Device/Increase time) Sit to Supine - Right: 6: Modified independent (Device/Increase time) Transfers Sit to Stand: 5: Supervision Sit to Stand Details: Verbal cues for precautions/safety Sit to Stand Details (indicate cue type and reason): Use of sit to stand from w/c x 10 reps for technique training and strengthening; little carryover of technique though; patient still requires verbal cues for safe hand placement and LLE/foot placement  underneath her LE.  Stand to Sit: 5: Supervision Stand to Sit Details (indicate cue type and reason): Verbal cues for precautions/safety Locomotion  Ambulation Ambulation: Yes Ambulation/Gait Assistance: 5: Supervision Ambulation Distance (Feet): 186 Feet (x2) Assistive device: Rolling walker Ambulation/Gait Assistance Details: Visual cues/gestures for sequencing;Verbal cues for sequencing;Verbal cues for gait pattern Ambulation/Gait Assistance Details (indicate cue type and reason): less cues needed today for full stance on LLE and increased step/stride length and step through gait pattern; patient tends to return to step to gait pattern when distracted with conversation.   Stairs / Additional Locomotion Stairs: Yes Stairs Assistance: 4: Min assist Stairs Assistance Details: Tactile cues for weight  shifting;Tactile cues for weight beaing;Verbal cues for sequencing;Verbal cues for technique Stairs Assistance Details (indicate cue type and reason): Patient reports she will have 2 rails at home; still requires verbal cues for safe sequence and tactile cues for anterior weight shift and weight acceptance on LLE and for eccentric lowering  Stair Management Technique: Two rails Number of Stairs: 4  Height of Stairs: 6  Wheelchair Mobility Wheelchair Assistance: 5: Careers information officer: Both upper extremities Wheelchair Parts Management: Needs assistance Distance: 100  Balance Dynamic Standing Balance Dynamic Standing - Balance Activities: Foam balance beam;Forward lean/weight shifting Dynamic Standing - Comments: ankle strategy training on balance foam beam performing anterior and posterior weight shifting with activation of gastroc and anterior tib muscles Exercises Cardiovascular Exercises NuStep: 10 minutes at level 4 with bilat UE and LE at greater than 70 steps per minute; 12-13 on RPE scale General Exercises - Lower Extremity Repetitive Sit to Stands: Two upper extremities;10 reps Repetitive Sit to Stands Level of Assist: Min Repetitive Sit to Stands Surface Height: 19 inches Other Exercises Other Exercises: 3 sets x 1:00 L gastroc stretching in standing with foot up on 2 inch step and bilat UE support   Therapy/Group: Individual Therapy (2 visits)  [name] [facility] 07/09/2011, 12:32 PM

## 2011-07-10 LAB — GLUCOSE, CAPILLARY
Glucose-Capillary: 144 mg/dL — ABNORMAL HIGH (ref 70–99)
Glucose-Capillary: 167 mg/dL — ABNORMAL HIGH (ref 70–99)
Glucose-Capillary: 128 mg/dL — ABNORMAL HIGH (ref 70–99)

## 2011-07-10 MED ORDER — PREGABALIN 50 MG PO CAPS
100.0000 mg | ORAL_CAPSULE | Freq: Three times a day (TID) | ORAL | Status: DC
  Administered 2011-07-10 – 2011-07-12 (×6): 100 mg via ORAL
  Filled 2011-07-10: qty 1
  Filled 2011-07-10: qty 2
  Filled 2011-07-10: qty 1
  Filled 2011-07-10 (×4): qty 2

## 2011-07-10 MED ORDER — MUSCLE RUB 10-15 % EX CREA
TOPICAL_CREAM | CUTANEOUS | Status: DC | PRN
  Filled 2011-07-10: qty 85

## 2011-07-10 NOTE — Progress Notes (Signed)
Patient ID: [name], female   DOB: 1951/08/09, 60 y.o.   MRN: [id number]   Subjective/Complaints: No new complaints. Nurse at bedside.Slept well.  R side chest sore with pushing WC  Review of Systems  Genitourinary:       Retention  Neurological: Positive for sensory change.  All other systems reviewed and are negative.    Objective: Vital Signs: Blood pressure 108/68, pulse 74, temperature 97.6 F (36.4 C), temperature source Oral, resp. rate 18, height 5' (1.524 m), weight 91 kg (200 lb 9.9 oz), SpO2 99.00%. No results found. No results found for this basename: WBC:2,HGB:2,HCT:2,PLT:2 in the last 72 hours  Basename 07/08/11 0730  NA --  K --  CL --  CO2 --  GLUCOSE --  BUN --  CREATININE 0.78  CALCIUM --    Physical Exam: General appearance: alert, cooperative and appears older than stated age Resp: clear to auscultation bilaterally Cardio: regular rate and rhythm GI: soft, non-tender; bowel sounds normal; no masses,  no organomegaly Extremities: Stasis changed BLE.  Cellulitis LLE improving with dry dressing on ulcer.  Continues with sensitivity         Left calf>left shin and bilateral feet. Skin: diabetic ulcers bilateral feet due to pressure and left shin Neurologic: alert and oriented X 3, LLE weakness.  Neuropathy BLE. Insensate feet  Assessment/Plan:  1. Functional deficits secondary to Fall, rhabdomyolysis, severe diabetic neuropathy which require 3+ hours per day of interdisciplinary therapy in a comprehensive inpatient rehab setting. Physiatrist is providing close team supervision and 24 hour management of active medical problems listed below. Physiatrist and rehab team continue to assess barriers to discharge/monitor patient progress toward functional and medical goals. Team Conference today Mobility: Bed Mobility Bed Mobility: No Rolling Right: 6: Modified independent (Device/Increase time) Right Sidelying to Sit: 6: Modified independent  (Device/Increase time) Supine to Sit: 6: Modified independent (Device/Increase time) Supine to Sit Details (indicate cue type and reason): Flat bed, no rail; pt required cues for sequence and safety secondary to attempting to pull on rail and therapist  Sit to Supine - Right: 6: Modified independent (Device/Increase time) Transfers Transfers: Yes Sit to Stand: 5: Supervision Sit to Stand Details (indicate cue type and reason): Use of sit to stand from w/c x 10 reps for technique training and strengthening; little carryover of technique though; patient still requires verbal cues for safe hand placement and LLE/foot placement  underneath her LE.  Stand to Sit: 5: Supervision Stand to Sit Details: vc's for safe hand placement Stand Pivot Transfers: 5: Supervision Stand Pivot Transfer Details (indicate cue type and reason): Stand pivot car transfer with RW with cues for safe hand placement and car transfer sequence.  Patient did require min A to bring LLE out of car secondary to decreased knee flexion Ambulation/Gait Ambulation/Gait Assistance: 5: Supervision Ambulation/Gait Assistance Details (indicate cue type and reason): less cues needed today for full stance on LLE and increased step/stride length and step through gait pattern; patient tends to return to step to gait pattern when distracted with conversation.   Ambulation Distance (Feet): 186 Feet (x2) Assistive device: Rolling walker Gait Pattern: Decreased step length - right;Decreased stride length;Decreased dorsiflexion - left;Decreased weight shift to left Gait velocity: 0.35 (With RW, cues to walk as fast as she can comfortably.)) Stairs: Yes Stairs Assistance: 4: Min assist Stairs Assistance Details (indicate cue type and reason): Patient reports she will have 2 rails at home; still requires verbal cues for safe sequence and tactile cues for  anterior weight shift and weight acceptance on LLE and for eccentric lowering  Stair Management  Technique: Two rails Number of Stairs: 4  Height of Stairs: 6  Wheelchair Mobility Wheelchair Mobility: No Wheelchair Assistance: 5: Careers information officer: Both upper extremities Wheelchair Parts Management: Needs assistance Distance: 100 ADL:   Cognition: Cognition Overall Cognitive Status: Impaired at baseline Arousal/Alertness: Awake/alert Orientation Level: Oriented X4 Attention: Alternating Alternating Attention: Appears intact Memory: Appears intact Awareness: Appears intact Problem Solving: Appears intact Executive Function:  (appears WFL for basic ADL) Safety/Judgment: Appears intact Cognition Arousal/Alertness: Awake/alert Orientation Level: Oriented X4      Medical Problem List and Plan:  1. DVT Prophylaxis/Anticoagulation: Pharmaceutical: Lovenox  2. Pain Management: prn Vicodin effective. 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  If progressive would repeat dopplers, cont lovenox and analgesics. 3. Mood: monitor for now. Very motivated to get better. Insomnia increase Sinequan dose to home dose of 125mg   Patient Active Hospital Problem List:  4. Rhabdomyolysis:  Assessment: CK improving recheck levels next week.  Renal status normalized today Plan:  Continue to push po fluids. Resume metformin.   5. Diabetes: Lantus/glucophage Assessment: check blood sugars ac/hs.Monitor closely while on steroid.  6. HTN: clonidine 0.4 mg qhs  No orthostasis. Monitor closely when up with activity.   7.OSA:  Assessment:compliant with CPAP. Slept better last night.  Plan: Continue CPAP. Monitor sleep patterns for now.  8. RA:  Assessment: Symptoms controlled on home regimen.  Plan: Continue hydroxychloroquine, methotrexate, folic acid and prednisone.  9. Cellulitis:  Assessment: Erythema and tenderness improving. IV vancomycin stopped today.  Plan: Recommendations to continue antibiotic for 14 days.  On Doxy.  Off diuretics which will likely cause increase in peripheral edema with increased activity. Need to monitor for now. ? Order diabetic shoes.  10. Right chest wall pain Assessment: Musculoskeletal in nature. Chest xray negative for rib fracture.  Plan: Continue lidocaine patch as this is effective.  11. Hypokalemia:  Assessment: Resolved with supplement.  Plan: Will hold supplement for now.  12. Gout:  Assessment: Monitor for symptoms with rhabdomyolysis.  Plan: Continue allopurinol.  13. BLE weakness:  Assessment: Multifactorial due to neuropathy, fibromyalgia, RA and deconditioning. ? Statin related.  Continue to hold statin and monitor. Plan: Physical therapy for strengthening. Reports LLE pain due to tightness/spasms.  16. Vitamin D deficiency:  Assessment: Low vitamin D levels @ 26. With ongoing weakness. Started on supplement  Plan: Continue 50,000 units weekly for now.  64. Asthma/ COPD hx:  Assessment: Cough.  Decreased wheezing  Plan: Continue advair. May need nebs scheduled. Tessalon helping with cough symptoms.  18. Constipation:  Assessment: Multiple stools with Miralax.  Plan: Decrease to once a day. Monitor stooling For C diff symptoms as antibiotics on board.  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  Plan: Continue doxepin, lexapro, and clonidine (helps with catatonia)  11.  Anemia: better  Continue iron. 12. Remote CVA with Left hemiparesis     Charlett Blake 07/10/2011, 9:16 AM              Patient ID: [name], female   DOB: Aug 26, 1950, 60 y.o.   MRN: [id number]    Subjective/Complaints: No new complaints. Slept well. No pain, SOB or CP  Objective: Vital Signs: Blood pressure 108/68, pulse 74, temperature 97.6 F (36.4 C), temperature source Oral, resp. rate 18, height 5' (1.524 m), weight 91 kg (  200 lb 9.9 oz), SpO2 99.00%. No results found. No results found for this  basename: WBC:2,HGB:2,HCT:2,PLT:2 in the last 72 hours  Basename 07/08/11 0730  NA --  K --  CL --  CO2 --  GLUCOSE --  BUN --  CREATININE 0.78  CALCIUM --    Physical Exam: General appearance: alert, cooperative and appears older than stated age Resp: clear to auscultation bilaterally Cardio: regular rate and rhythm GI: soft, non-tender; bowel sounds normal; no masses,  no organomegaly Extremities: Stasis changed BLE.  Cellulitis LLE improving with dry dressing on ulcer.  Continues with sensitivity         Left calf>left shin and bilateral feet. Skin: diabetic ulcers bilateral feet due to pressure and left shin Neurologic: alert and oriented X 3, LLE weakness.  Neuropathy BLE.   Assessment/Plan:  1. Functional deficits secondary to Fall, rhabdomyolysis, severe diabetic neuropathy which require 3+ hours per day of interdisciplinary therapy in a comprehensive inpatient rehab setting. Physiatrist is providing close team supervision and 24 hour management of active medical problems listed below. Physiatrist and rehab team continue to assess barriers to discharge/monitor patient progress toward functional and medical goals. Team Conference today Mobility: Bed Mobility Bed Mobility: No Rolling Right: 6: Modified independent (Device/Increase time) Right Sidelying to Sit: 6: Modified independent (Device/Increase time) Supine to Sit: 6: Modified independent (Device/Increase time) Supine to Sit Details (indicate cue type and reason): Flat bed, no rail; pt required cues for sequence and safety secondary to attempting to pull on rail and therapist  Sit to Supine - Right: 6: Modified independent (Device/Increase time) Transfers Transfers: Yes Sit to Stand: 5: Supervision Sit to Stand Details (indicate cue type and reason): Use of sit to stand from w/c x 10 reps for technique training and strengthening; little carryover of technique though; patient still requires verbal cues for safe hand  placement and LLE/foot placement  underneath her LE.  Stand to Sit: 5: Supervision Stand to Sit Details: vc's for safe hand placement Stand Pivot Transfers: 5: Supervision Stand Pivot Transfer Details (indicate cue type and reason): Stand pivot car transfer with RW with cues for safe hand placement and car transfer sequence.  Patient did require min A to bring LLE out of car secondary to decreased knee flexion Ambulation/Gait Ambulation/Gait Assistance: 5: Supervision Ambulation/Gait Assistance Details (indicate cue type and reason): less cues needed today for full stance on LLE and increased step/stride length and step through gait pattern; patient tends to return to step to gait pattern when distracted with conversation.   Ambulation Distance (Feet): 186 Feet (x2) Assistive device: Rolling walker Gait Pattern: Decreased step length - right;Decreased stride length;Decreased dorsiflexion - left;Decreased weight shift to left Gait velocity: 0.35 (With RW, cues to walk as fast as she can comfortably.)) Stairs: Yes Stairs Assistance: 4: Min assist Stairs Assistance Details (indicate cue type and reason): Patient reports she will have 2 rails at home; still requires verbal cues for safe sequence and tactile cues for anterior weight shift and weight acceptance on LLE and for eccentric lowering  Stair Management Technique: Two rails Number of Stairs: 4  Height of Stairs: 6  Wheelchair Mobility Wheelchair Mobility: No Wheelchair Assistance: 5: Careers information officer: Both upper extremities Wheelchair Parts Management: Needs assistance Distance: 100 ADL:   Cognition: Cognition Overall Cognitive Status: Impaired at baseline Arousal/Alertness: Awake/alert Orientation Level: Oriented X4 Attention: Alternating Alternating Attention: Appears intact Memory: Appears intact Awareness: Appears intact Problem Solving: Appears intact Executive Function:  (appears WFL for basic  ADL) Safety/Judgment: Appears intact  07/10/2011, 9:16 AM    Subjective/Complaints: No new complaints. Slept well. No pain, SOB or CP  Objective: Vital Signs: Blood pressure 108/68, pulse 74, temperature 97.6 F (36.4 C), temperature source Oral, resp. rate 18, height 5' (1.524 m), weight 91 kg (200 lb 9.9 oz), SpO2 99.00%. No results found. No results found for this basename: WBC:2,HGB:2,HCT:2,PLT:2 in the last 72 hours  Basename 07/08/11 0730  NA --  K --  CL --  CO2 --  GLUCOSE --  BUN --  CREATININE 0.78  CALCIUM --    Physical Exam: General appearance: alert, cooperative and appears older than stated age Resp: clear to auscultation bilaterally Cardio: regular rate and rhythm GI: soft, non-tender; bowel sounds normal; no masses,  no organomegaly Extremities: Stasis changed BLE.  Cellulitis LLE improving with dry dressing on ulcer.  Continues with sensitivity         Left calf>left shin and bilateral feet. Skin: diabetic ulcers bilateral feet due to pressure and left shin Neurologic: alert and oriented X 3, LLE weakness.  Neuropathy BLE.   Assessment/Plan:  1. Functional deficits secondary to Fall, rhabdomyolysis, severe diabetic neuropathy which require 3+ hours per day of interdisciplinary therapy in a comprehensive inpatient rehab setting. Physiatrist is providing close team supervision and 24 hour management of active medical problems listed below. Physiatrist and rehab team continue to assess barriers to discharge/monitor patient progress toward functional and medical goals. Team Conference today Mobility: Bed Mobility Bed Mobility: No Rolling Right: 6: Modified independent (Device/Increase time) Right Sidelying to Sit: 6: Modified independent (Device/Increase time) Supine to Sit: 6: Modified independent (Device/Increase time) Supine to Sit Details (indicate cue type and reason): Flat bed, no rail; pt required cues for sequence and safety secondary to attempting  to pull on rail and therapist  Sit to Supine - Right: 6: Modified independent (Device/Increase time) Transfers Transfers: Yes Sit to Stand: 5: Supervision Sit to Stand Details (indicate cue type and reason): Use of sit to stand from w/c x 10 reps for technique training and strengthening; little carryover of technique though; patient still requires verbal cues for safe hand placement and LLE/foot placement  underneath her LE.  Stand to Sit: 5: Supervision Stand to Sit Details: vc's for safe hand placement Stand Pivot Transfers: 5: Supervision Stand Pivot Transfer Details (indicate cue type and reason): Stand pivot car transfer with RW with cues for safe hand placement and car transfer sequence.  Patient did require min A to bring LLE out of car secondary to decreased knee flexion Ambulation/Gait Ambulation/Gait Assistance: 5: Supervision Ambulation/Gait Assistance Details (indicate cue type and reason): less cues needed today for full stance on LLE and increased step/stride length and step through gait pattern; patient tends to return to step to gait pattern when distracted with conversation.   Ambulation Distance (Feet): 186 Feet (x2) Assistive device: Rolling walker Gait Pattern: Decreased step length - right;Decreased stride length;Decreased dorsiflexion - left;Decreased weight shift to left Gait velocity: 0.35 (With RW, cues to walk as fast as she can comfortably.)) Stairs: Yes Stairs Assistance: 4: Min assist Stairs Assistance Details (indicate cue type and reason): Patient reports she will have 2 rails at home; still requires verbal cues for safe sequence and tactile cues for anterior weight shift and weight acceptance on LLE and for eccentric lowering  Stair Management Technique: Two rails Number of Stairs: 4  Height of Stairs: 6  Wheelchair Mobility Wheelchair Mobility: No Wheelchair Assistance: 5: Careers information officer: Both upper  extremities Wheelchair Parts  Management: Needs assistance Distance: 100 ADL:   Cognition: Cognition Overall Cognitive Status: Impaired at baseline Arousal/Alertness: Awake/alert Orientation Level: Oriented X4 Attention: Alternating Alternating Attention: Appears intact Memory: Appears intact Awareness: Appears intact Problem Solving: Appears intact Executive Function:  (appears WFL for basic ADL) Safety/Judgment: Appears intact Cognition Arousal/Alertness: Awake/alert Orientation Level: Oriented X4      Medical Problem List and Plan:  1. DVT Prophylaxis/Anticoagulation: Pharmaceutical: Lovenox  2. Pain Management: prn Vicodin effective. 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  If progressive would repeat dopplers, cont lovenox and analgesics. 3. Mood: monitor for now. Very motivated to get better. Insomnia increase Sinequan dose to home dose of 125mg   Patient Active Hospital Problem List:  4. Rhabdomyolysis:  Assessment: CK improving recheck levels next week.  Renal status normalized today Plan:  Continue to push po fluids. Resume metformin.   5. Diabetes:  Assessment: blood sugars poorly controlled due to stress dose steriods. Prednisone decreased to home dose today.  Plan: Monitor with AC/HS CBG checks. Continue lantus bid. Increase meal coverge for tighter control.  Resume metformin. 6. HTN:  Assessment:resonalble control currently. Ace added due to DM/ for renal protection today.  Plan: Monitor renal status with ARB on board. Off lasix and spironolactone currently. Resume clonidine for schizophrenia.  7.OSA:  Assessment:compliant with CPAP. Slept better last night.  Plan: Continue CPAP. Monitor sleep patterns for now.  8. RA:  Assessment: Symptoms controlled on home regimen.  Plan: Continue hydroxychloroquine, methotrexate, folic acid and prednisone.  9. Cellulitis:  Assessment: Erythema and tenderness improving. IV vancomycin stopped  today.  Plan: Recommendations to continue antibiotic for 14 days. On Doxy.  Off diuretics which will likely cause increase in peripheral edema with increased activity. Need to monitor for now. ? Order diabetic shoes.  10. Right chest wall pain Assessment: Musculoskeletal in nature. Chest xray negative for rib fracture.  Plan: Continue lidocaine patch as this is effective.  11. Hypokalemia:  Assessment: Resolved with supplement.  Plan: Will hold supplement for now.  12. Gout:  Assessment: Monitor for symptoms with rhabdomyolysis.  Plan: Continue allopurinol.  13. BLE weakness:  Assessment: Multifactorial due to neuropathy, fibromyalgia, RA and deconditioning. ? Statin related.  Continue to hold statin and monitor. Plan: Physical therapy for strengthening. Reports LLE pain due to tightness/spasms.  16. Vitamin D deficiency:  Assessment: Low vitamin D levels @ 26. With ongoing weakness. Started on supplement  Plan: Continue 50,000 units weekly for now.  57. Asthma/ COPD hx:  Assessment: Cough.  Decreased wheezing  Plan: Continue advair. May need nebs scheduled. Tessalon helping with cough symptoms.  18. Constipation:  Assessment: Multiple stools with Miralax.  Plan: Decrease to once a day. Monitor stooling For C diff symptoms as antibiotics on board.  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  Plan: Continue doxepin, lexapro, and clonidine (helps with catatonia)  11.  Anemia: better  Continue iron. 12. Remote CVA with Left hemiparesis     Charlett Blake 07/10/2011, 9:16 AM

## 2011-07-10 NOTE — Progress Notes (Signed)
Occupational Therapy Note  Patient Details  Name: [name] MRN: [id number] Date of Birth: [birth year] Today's Date: 07/10/2011  Time: 1035 - 1125 50 min  Pain - general c/o pain - especially in BLE. Asked nsg for meds. Meds given.  Skilled Intervention: ADL retraining at shower level with overall S for B/D with the exception of A for TEDS. S with toilet transfer and toileting RW level. Able to obtain clothes RW level. Min vc for safety. Excellent participation.  Individual session.    Seaver Machia,HILLARY 07/10/2011, 11:13 AM

## 2011-07-10 NOTE — Progress Notes (Signed)
Patient alert oriented x 3. Able to make needs known. Min. Assist for transfer. Pain with activity to right chest, md aware with order. Continent of bowel and bladder. Dressing changed to lower extremities.

## 2011-07-11 LAB — GLUCOSE, CAPILLARY
Glucose-Capillary: 109 mg/dL — ABNORMAL HIGH (ref 70–99)
Glucose-Capillary: 114 mg/dL — ABNORMAL HIGH (ref 70–99)
Glucose-Capillary: 118 mg/dL — ABNORMAL HIGH (ref 70–99)
Glucose-Capillary: 132 mg/dL — ABNORMAL HIGH (ref 70–99)
Glucose-Capillary: 136 mg/dL — ABNORMAL HIGH (ref 70–99)
Glucose-Capillary: 93 mg/dL (ref 70–99)

## 2011-07-11 MED ORDER — TRAMADOL-ACETAMINOPHEN 37.5-325 MG PO TABS
1.0000 | ORAL_TABLET | Freq: Four times a day (QID) | ORAL | Status: DC | PRN
  Administered 2011-07-11 – 2011-07-12 (×3): 1 via ORAL
  Filled 2011-07-11 (×4): qty 1

## 2011-07-11 NOTE — Progress Notes (Addendum)
Physical Therapy Session Note  Patient Details  Name: Stephaney Dicostanzo MRN: [id number] Date of Birth: 09-Feb-1951  Today's Date: 07/11/2011 Time: [id number] Time Calculation (min): 60 min  Precautions: Precautions Precautions: Fall Precaution Comments: fall Required Braces or Orthoses:  diabetic shoes and knee high TEDs Weight Bearing Restrictions: No  Pain: rated 6/10 LLE thigh and calf; RN aware   Skilled Therapeutic Interventions: treatment focusing on transfers and gait at mod I level.  W/C legrests shortened to accommodate pt's leg length, and provide mild heel cord stretch; pt reported that it felt better with full foot support bilaterally; legrests kept elevated due to chronic edema bilat. LEs.   Pt safe with transitions after legrests removed from wc; pt does not plan to use w/c in her house.  SPT mod I.  Gait training x 75' with RW on tile floor including turns, mod I;  5 stairs with 2 rails with S.  10 m timed walking test with RW = 29 seconds, = decreased functional efficiency,and risk for recurrent falls.  Dynamic balance while standing on foam mat; toe raises and heel raises, and lateral reaches slightly out of BOS to elicit LE musculature activation for ankle strategy, with min A  during  frequentLOB.  Pt encouraged with tactile cues to use hip strategy during posterior LOB as ankle strategy is delayed and inadequate.     General: chart reviewed       Locomotion  Ambulation Ambulation/Gait Assistance: 6: Modified independent (Device/Increase time)         Therapy/Group: Individual Therapy  Judea Fennimore 07/11/2011, 11:54 AM

## 2011-07-11 NOTE — Progress Notes (Signed)
Patient ID: [name], female   DOB: Jul 14, 1951, 60 y.o.   MRN: [id number] Patient ID: [name], female   DOB: 12-22-1950, 60 y.o.   MRN: [id number]   Subjective/Complaints: No new complaints. Nurse at bedside.Slept well.  R side chest sore with pushing WC or turning in bed.  Became excessively somnolent last pm after vicoden.  Pt states she took this before without problems but also had problem with codeine in past.  Hasn't taken tramadol  Review of Systems  Genitourinary:       Retention  Neurological: Positive for sensory change.  All other systems reviewed and are negative.    Objective: Vital Signs: Blood pressure 150/77, pulse 80, temperature 98.8 F (37.1 C), temperature source Oral, resp. rate 18, height 5' (1.524 m), weight 91 kg (200 lb 9.9 oz), SpO2 98.00%. No results found. No results found for this basename: WBC:2,HGB:2,HCT:2,PLT:2 in the last 72 hours No results found for this basename: NA:2,K:2,CL:2,CO2:2,GLUCOSE:2,BUN:2,CREATININE:2,CALCIUM:2 in the last 72 hours  Physical Exam: General appearance: alert, cooperative and appears older than stated age Resp: clear to auscultation bilaterally Cardio: regular rate and rhythm GI: soft, non-tender; bowel sounds normal; no masses,  no organomegaly Extremities: Stasis changed BLE.  Cellulitis LLE improving with dry dressing on ulcer.  Continues with sensitivity         Left calf>left shin and bilateral feet. Skin: diabetic ulcers bilateral feet due to pressure and left shin Neurologic: alert and oriented X 3, LLE weakness.  Neuropathy BLE. Insensate feet  Assessment/Plan:  1. Functional deficits secondary to Fall, rhabdomyolysis, severe diabetic neuropathy which require 3+ hours per day of interdisciplinary therapy in a comprehensive inpatient rehab setting. Physiatrist is providing close team supervision and 24 hour management of active medical problems listed below. Physiatrist and rehab team continue to assess  barriers to discharge/monitor patient progress toward functional and medical goals. Team Conference today Mobility: Bed Mobility Bed Mobility: No Rolling Right: 6: Modified independent (Device/Increase time) Right Sidelying to Sit: 6: Modified independent (Device/Increase time) Supine to Sit: 6: Modified independent (Device/Increase time) Supine to Sit Details (indicate cue type and reason): Flat bed, no rail; pt required cues for sequence and safety secondary to attempting to pull on rail and therapist  Sit to Supine - Right: 6: Modified independent (Device/Increase time) Transfers Transfers: Yes Sit to Stand: 5: Supervision Sit to Stand Details (indicate cue type and reason): Use of sit to stand from w/c x 10 reps for technique training and strengthening; little carryover of technique though; patient still requires verbal cues for safe hand placement and LLE/foot placement  underneath her LE.  Stand to Sit: 5: Supervision Stand to Sit Details: vc's for safe hand placement Stand Pivot Transfers: 5: Supervision Stand Pivot Transfer Details (indicate cue type and reason): Stand pivot car transfer with RW with cues for safe hand placement and car transfer sequence.  Patient did require min A to bring LLE out of car secondary to decreased knee flexion Ambulation/Gait Ambulation/Gait Assistance: 5: Supervision Ambulation/Gait Assistance Details (indicate cue type and reason): less cues needed today for full stance on LLE and increased step/stride length and step through gait pattern; patient tends to return to step to gait pattern when distracted with conversation.   Ambulation Distance (Feet): 186 Feet (x2) Assistive device: Rolling walker Gait Pattern: Decreased step length - right;Decreased stride length;Decreased dorsiflexion - left;Decreased weight shift to left Gait velocity: 0.35 (With RW, cues to walk as fast as she can comfortably.)) Stairs: Yes Stairs Assistance: 4:  Min assist Stairs  Assistance Details (indicate cue type and reason): Patient reports she will have 2 rails at home; still requires verbal cues for safe sequence and tactile cues for anterior weight shift and weight acceptance on LLE and for eccentric lowering  Stair Management Technique: Two rails Number of Stairs: 4  Height of Stairs: 6  Wheelchair Mobility Wheelchair Mobility: No Wheelchair Assistance: 5: Careers information officer: Both upper extremities Wheelchair Parts Management: Needs assistance Distance: 100 ADL:   Cognition: Cognition Overall Cognitive Status: Impaired at baseline Arousal/Alertness: Awake/alert Orientation Level: Oriented X4 Attention: Alternating Alternating Attention: Appears intact Memory: Appears intact Awareness: Appears intact Problem Solving: Appears intact Executive Function:  (appears WFL for basic ADL) Safety/Judgment: Appears intact Cognition Arousal/Alertness: Awake/alert Orientation Level: Oriented X4      Medical Problem List and Plan:  1. DVT Prophylaxis/Anticoagulation: Pharmaceutical: Lovenox  2. Pain Management: prn Vicodin effective. 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  If progressive would repeat dopplers, cont lovenox and analgesics. 3. Mood: monitor for now. Very motivated to get better. Insomnia increase Sinequan dose to home dose of 125mg   Patient Active Hospital Problem List:  4. Rhabdomyolysis:  Assessment: CK improving recheck levels next week.  Renal status normalized today Plan:  Continue to push po fluids. Resume metformin.   5. Diabetes: Lantus/glucophage Assessment: check blood sugars ac/hs.Monitor closely while on steroid.  6. HTN: clonidine 0.4 mg qhs  No orthostasis. Monitor closely when up with activity.   7.OSA:  Assessment:compliant with CPAP. Slept better last night.  Plan: Continue CPAP. Monitor sleep patterns for now.  8. RA:  Assessment: Symptoms  controlled on home regimen.  Plan: Continue hydroxychloroquine, methotrexate, folic acid and prednisone.  9. Cellulitis:  Assessment: Erythema and tenderness improving. IV vancomycin stopped today.  Plan: Recommendations to continue antibiotic for 14 days. On Doxy.  Off diuretics which will likely cause increase in peripheral edema with increased activity. Need to monitor for now. ? Order diabetic shoes.  10. Right chest wall pain Assessment: Musculoskeletal in nature. Chest xray negative for rib fracture.  Plan: Continue lidocaine patch as this is effective. D/C vicoden, trial ultracet, kpad to chest 11. Hypokalemia:  Assessment: Resolved with supplement.  Plan: Will hold supplement for now.  12. Gout:  Assessment: Monitor for symptoms with rhabdomyolysis.  Plan: Continue allopurinol.  13. BLE weakness:  Assessment: Multifactorial due to neuropathy, fibromyalgia, RA and deconditioning. ? Statin related.  Continue to hold statin and monitor. Plan: Physical therapy for strengthening. Reports LLE pain due to tightness/spasms.  16. Vitamin D deficiency:  Assessment: Low vitamin D levels @ 26. With ongoing weakness. Started on supplement  Plan: Continue 50,000 units weekly for now.  59. Asthma/ COPD hx:  Assessment: Cough.  Decreased wheezing  Plan: Continue advair. May need nebs scheduled. Tessalon helping with cough symptoms.  18. Constipation:  Assessment: Multiple stools with Miralax.  Plan: Decrease to once a day. Monitor stooling For C diff symptoms as antibiotics on board.  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  Plan: Continue doxepin, lexapro, and clonidine (helps with catatonia)  11.  Anemia: better  Continue iron. 12. Remote CVA with Left hemiparesis     Charlett Blake 07/11/2011, 9:22 AM              Patient ID: [name], female   DOB: 10-Sep-1950, 60 y.o.   MRN:  [id number]    Subjective/Complaints:  No new complaints. Slept well. No pain, SOB or CP  Objective: Vital Signs: Blood pressure 150/77, pulse 80, temperature 98.8 F (37.1 C), temperature source Oral, resp. rate 18, height 5' (1.524 m), weight 91 kg (200 lb 9.9 oz), SpO2 98.00%. No results found. No results found for this basename: WBC:2,HGB:2,HCT:2,PLT:2 in the last 72 hours No results found for this basename: NA:2,K:2,CL:2,CO2:2,GLUCOSE:2,BUN:2,CREATININE:2,CALCIUM:2 in the last 72 hours  Physical Exam: General appearance: alert, cooperative and appears older than stated age Resp: clear to auscultation bilaterally Cardio: regular rate and rhythm GI: soft, non-tender; bowel sounds normal; no masses,  no organomegaly Extremities: Stasis changed BLE.  Cellulitis LLE improving with dry dressing on ulcer.  Continues with sensitivity         Left calf>left shin and bilateral feet. Skin: diabetic ulcers bilateral feet due to pressure and left shin Neurologic: alert and oriented X 3, LLE weakness.  Neuropathy BLE.   Assessment/Plan:  1. Functional deficits secondary to Fall, rhabdomyolysis, severe diabetic neuropathy which require 3+ hours per day of interdisciplinary therapy in a comprehensive inpatient rehab setting. Physiatrist is providing close team supervision and 24 hour management of active medical problems listed below. Physiatrist and rehab team continue to assess barriers to discharge/monitor patient progress toward functional and medical goals. Team Conference today Mobility: Bed Mobility Bed Mobility: No Rolling Right: 6: Modified independent (Device/Increase time) Right Sidelying to Sit: 6: Modified independent (Device/Increase time) Supine to Sit: 6: Modified independent (Device/Increase time) Supine to Sit Details (indicate cue type and reason): Flat bed, no rail; pt required cues for sequence and safety secondary to attempting to pull on rail and therapist  Sit to Supine  - Right: 6: Modified independent (Device/Increase time) Transfers Transfers: Yes Sit to Stand: 5: Supervision Sit to Stand Details (indicate cue type and reason): Use of sit to stand from w/c x 10 reps for technique training and strengthening; little carryover of technique though; patient still requires verbal cues for safe hand placement and LLE/foot placement  underneath her LE.  Stand to Sit: 5: Supervision Stand to Sit Details: vc's for safe hand placement Stand Pivot Transfers: 5: Supervision Stand Pivot Transfer Details (indicate cue type and reason): Stand pivot car transfer with RW with cues for safe hand placement and car transfer sequence.  Patient did require min A to bring LLE out of car secondary to decreased knee flexion Ambulation/Gait Ambulation/Gait Assistance: 5: Supervision Ambulation/Gait Assistance Details (indicate cue type and reason): less cues needed today for full stance on LLE and increased step/stride length and step through gait pattern; patient tends to return to step to gait pattern when distracted with conversation.   Ambulation Distance (Feet): 186 Feet (x2) Assistive device: Rolling walker Gait Pattern: Decreased step length - right;Decreased stride length;Decreased dorsiflexion - left;Decreased weight shift to left Gait velocity: 0.35 (With RW, cues to walk as fast as she can comfortably.)) Stairs: Yes Stairs Assistance: 4: Min assist Stairs Assistance Details (indicate cue type and reason): Patient reports she will have 2 rails at home; still requires verbal cues for safe sequence and tactile cues for anterior weight shift and weight acceptance on LLE and for eccentric lowering  Stair Management Technique: Two rails Number of Stairs: 4  Height of Stairs: 6  Wheelchair Mobility Wheelchair Mobility: No Wheelchair Assistance: 5: Careers information officer: Both upper extremities Wheelchair Parts Management: Needs assistance Distance: 100 ADL:    Cognition: Cognition Overall Cognitive Status: Impaired at baseline Arousal/Alertness: Awake/alert Orientation Level: Oriented X4 Attention: Alternating Alternating Attention:  Appears intact Memory: Appears intact Awareness: Appears intact Problem Solving: Appears intact Executive Function:  (appears WFL for basic ADL) Safety/Judgment: Appears intact  07/11/2011, 9:22 AM    Subjective/Complaints: No new complaints. Slept well. No pain, SOB or CP  Objective: Vital Signs: Blood pressure 150/77, pulse 80, temperature 98.8 F (37.1 C), temperature source Oral, resp. rate 18, height 5' (1.524 m), weight 91 kg (200 lb 9.9 oz), SpO2 98.00%. No results found. No results found for this basename: WBC:2,HGB:2,HCT:2,PLT:2 in the last 72 hours No results found for this basename: NA:2,K:2,CL:2,CO2:2,GLUCOSE:2,BUN:2,CREATININE:2,CALCIUM:2 in the last 72 hours  Physical Exam: General appearance: alert, cooperative and appears older than stated age Resp: clear to auscultation bilaterally Cardio: regular rate and rhythm GI: soft, non-tender; bowel sounds normal; no masses,  no organomegaly Extremities: Stasis changed BLE.  Cellulitis LLE improving with dry dressing on ulcer.  Continues with sensitivity         Left calf>left shin and bilateral feet. Skin: diabetic ulcers bilateral feet due to pressure and left shin Neurologic: alert and oriented X 3, LLE weakness.  Neuropathy BLE.   Assessment/Plan:  1. Functional deficits secondary to Fall, rhabdomyolysis, severe diabetic neuropathy which require 3+ hours per day of interdisciplinary therapy in a comprehensive inpatient rehab setting. Physiatrist is providing close team supervision and 24 hour management of active medical problems listed below. Physiatrist and rehab team continue to assess barriers to discharge/monitor patient progress toward functional and medical goals. Team Conference today Mobility: Bed Mobility Bed Mobility:  No Rolling Right: 6: Modified independent (Device/Increase time) Right Sidelying to Sit: 6: Modified independent (Device/Increase time) Supine to Sit: 6: Modified independent (Device/Increase time) Supine to Sit Details (indicate cue type and reason): Flat bed, no rail; pt required cues for sequence and safety secondary to attempting to pull on rail and therapist  Sit to Supine - Right: 6: Modified independent (Device/Increase time) Transfers Transfers: Yes Sit to Stand: 5: Supervision Sit to Stand Details (indicate cue type and reason): Use of sit to stand from w/c x 10 reps for technique training and strengthening; little carryover of technique though; patient still requires verbal cues for safe hand placement and LLE/foot placement  underneath her LE.  Stand to Sit: 5: Supervision Stand to Sit Details: vc's for safe hand placement Stand Pivot Transfers: 5: Supervision Stand Pivot Transfer Details (indicate cue type and reason): Stand pivot car transfer with RW with cues for safe hand placement and car transfer sequence.  Patient did require min A to bring LLE out of car secondary to decreased knee flexion Ambulation/Gait Ambulation/Gait Assistance: 5: Supervision Ambulation/Gait Assistance Details (indicate cue type and reason): less cues needed today for full stance on LLE and increased step/stride length and step through gait pattern; patient tends to return to step to gait pattern when distracted with conversation.   Ambulation Distance (Feet): 186 Feet (x2) Assistive device: Rolling walker Gait Pattern: Decreased step length - right;Decreased stride length;Decreased dorsiflexion - left;Decreased weight shift to left Gait velocity: 0.35 (With RW, cues to walk as fast as she can comfortably.)) Stairs: Yes Stairs Assistance: 4: Min assist Stairs Assistance Details (indicate cue type and reason): Patient reports she will have 2 rails at home; still requires verbal cues for safe sequence and  tactile cues for anterior weight shift and weight acceptance on LLE and for eccentric lowering  Stair Management Technique: Two rails Number of Stairs: 4  Height of Stairs: 6  Wheelchair Mobility Wheelchair Mobility: No Wheelchair Assistance: 5: Careers information officer:  Both upper extremities Wheelchair Parts Management: Needs assistance Distance: 100 ADL:   Cognition: Cognition Overall Cognitive Status: Impaired at baseline Arousal/Alertness: Awake/alert Orientation Level: Oriented X4 Attention: Alternating Alternating Attention: Appears intact Memory: Appears intact Awareness: Appears intact Problem Solving: Appears intact Executive Function:  (appears WFL for basic ADL) Safety/Judgment: Appears intact Cognition Arousal/Alertness: Awake/alert Orientation Level: Oriented X4      Medical Problem List and Plan:  1. DVT Prophylaxis/Anticoagulation: Pharmaceutical: Lovenox  2. Pain Management: prn Vicodin effective. 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  If progressive would repeat dopplers, cont lovenox and analgesics. 3. Mood: monitor for now. Very motivated to get better. Insomnia increase Sinequan dose to home dose of 125mg   Patient Active Hospital Problem List:  4. Rhabdomyolysis:  Assessment: CK improving recheck levels next week.  Renal status normalized today Plan:  Continue to push po fluids. Resume metformin.   5. Diabetes:  Assessment: blood sugars poorly controlled due to stress dose steriods. Prednisone decreased to home dose today.  Plan: Monitor with AC/HS CBG checks. Continue lantus bid. Increase meal coverge for tighter control.  Resume metformin. 6. HTN:  Assessment:resonalble control currently. Ace added due to DM/ for renal protection today.  Plan: Monitor renal status with ARB on board. Off lasix and spironolactone currently. Resume clonidine for schizophrenia.  7.OSA:   Assessment:compliant with CPAP. Slept better last night.  Plan: Continue CPAP. Monitor sleep patterns for now.  8. RA:  Assessment: Symptoms controlled on home regimen.  Plan: Continue hydroxychloroquine, methotrexate, folic acid and prednisone.  9. Cellulitis:  Assessment: Erythema and tenderness improving. IV vancomycin stopped today.  Plan: Recommendations to continue antibiotic for 14 days. On Doxy.  Off diuretics which will likely cause increase in peripheral edema with increased activity. Need to monitor for now. ? Order diabetic shoes.  10. Right chest wall pain Assessment: Musculoskeletal in nature. Chest xray negative for rib fracture.  Plan: Continue lidocaine patch as this is effective.  11. Hypokalemia:  Assessment: Resolved with supplement.  Plan: Will hold supplement for now.  12. Gout:  Assessment: Monitor for symptoms with rhabdomyolysis.  Plan: Continue allopurinol.  13. BLE weakness:  Assessment: Multifactorial due to neuropathy, fibromyalgia, RA and deconditioning. ? Statin related.  Continue to hold statin and monitor. Plan: Physical therapy for strengthening. Reports LLE pain due to tightness/spasms.  16. Vitamin D deficiency:  Assessment: Low vitamin D levels @ 26. With ongoing weakness. Started on supplement  Plan: Continue 50,000 units weekly for now.  17. Asthma/ COPD hx:  Assessment: Cough.  Decreased wheezing  Plan: Continue advair. May need nebs scheduled. Tessalon helping with cough symptoms.  18. Constipation:  Assessment: Multiple stools with Miralax.  Plan: Decrease to once a day. Monitor stooling For C diff symptoms as antibiotics on board.  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  Plan: Continue doxepin, lexapro, and clonidine (helps with catatonia)  11.  Anemia: better  Continue iron. 12. Remote CVA with Left hemiparesis     KIRSTEINS,ANDREW E 07/11/2011, 9:22 AM

## 2011-07-12 DIAGNOSIS — R5381 Other malaise: Secondary | ICD-10-CM

## 2011-07-12 LAB — GLUCOSE, CAPILLARY
Glucose-Capillary: 101 mg/dL — ABNORMAL HIGH (ref 70–99)
Glucose-Capillary: 119 mg/dL — ABNORMAL HIGH (ref 70–99)

## 2011-07-12 MED ORDER — TRAMADOL-ACETAMINOPHEN 37.5-325 MG PO TABS: 1.0000 | ORAL_TABLET | Freq: Four times a day (QID) | ORAL | Status: AC | PRN

## 2011-07-12 MED ORDER — LIDOCAINE 5 % EX PTCH: 1.0000 | MEDICATED_PATCH | CUTANEOUS | Status: AC

## 2011-07-12 MED ORDER — INSULIN GLARGINE 100 UNIT/ML ~~LOC~~ SOLN: 20.0000 [IU] | Freq: Every day | SUBCUTANEOUS | Status: DC

## 2011-07-12 NOTE — Progress Notes (Signed)
Social Work Discharge Note Discharge Note  The overall goal for the admission was met for:   Discharge location: Yes-HOME WITH NIECE AND NIECE'S BOYFRIEND  Length of Stay: Yes-11 DAYS  Discharge activity level: Yes-MOD/I LEVEL  Home/community participation: Yes  Services provided included: MD, RD, PT, OT, SLP, RN, CM, TR, Pharmacy and SW  Financial Services: Medicare and Medicaid  Follow-up services arranged: Home Health: [facility]-RN,OT,PT,SW  Comments (or additional information):[facility]-3IN1,[facility]  Patient/Family verbalized understanding of follow-up arrangements: Yes  Individual responsible for coordination of the follow-up plan:  [name]-NIECE  Confirmed correct DME delivered: Elease Hashimoto 07/12/2011    Elease Hashimoto

## 2011-07-12 NOTE — Progress Notes (Signed)
Therapeutic Recreation Discharge Summary Patient Details  Name: [name] MRN: [id number] Date of Birth: 1950/08/27  Long term goals set: 1  Long term goals met: 1  Comments on progress toward goals: Pt has made great progress toward LTG meeting Mod I level for TR tasks.  Pt has demonstrated safe techniques both seated and standing.  Pt does require extra time to complete tasks safely.   Pt ready for d/c home with niece to provide care.  Goal  Met.  Reasons goals not met:  n/a  Reasons for discharge: discharge from hospital  Patient/family agrees with progress made and goals achieved: Yes  Annmargaret Decaprio 07/12/2011, 11:25 AM

## 2011-07-12 NOTE — Plan of Care (Signed)
Problem: RH Dressing Goal: LTG Patient will perform lower body dressing w/assist (OT) LTG: Patient will perform lower body dressing with mod I assist with nec adapt equipment without cues in sit to stand positioning (OT)  Outcome: Adequate for Discharge Pt is mod I with LB dsg with the exception of requiring assistance to don TED hose.

## 2011-07-12 NOTE — Discharge Summary (Signed)
  Discharge summary [phone] [id number]

## 2011-07-12 NOTE — Progress Notes (Signed)
Occupational Therapy Discharge Summary  Patient Details  Name: [name] MRN: [id number] Date of Birth: [birth year] Today's Date: 07/12/2011  Patient has met 10 of 10 long term goals due to improved activity tolerance, improved balance and postural control.  Patient's care partner is independent to provide the necessary cognitive assistance at discharge.  Pt is modified Independent with all self-cares with use of rollator walker in home environment.  Pt will require assist to don TED hose at home.  Pt requires occasional verbal cues for safety, specifically with higher level tasks.  Recommendation:  Patient will not require further OT follow up at this time, pt is modified independent with all self-cares and requires min verbal cues for safety with higher level tasks.  Equipment: Equipment provided: 3 in 1 commode  Patient/family agrees with progress made and goals achieved: Yes  Sim Boast 07/12/2011, 9:55 AM

## 2011-07-12 NOTE — Progress Notes (Signed)
Physical Therapy Discharge Summary  Patient Details  Name: [name] MRN: [id number] Date of Birth: 27-Aug-1950 Today's Date: 07/12/2011  Treatment Session: 1005-1102 (19 minutes) and 1303-1357 (54 minutes) Patient participated in skilled physical therapy intervention with focus on re-assessment of gait, gait velocity, gait without AD, increasing L ankle DF ROM for gait and stairs with 3 reps x 1:00 gastroc self-stretch, and floor <> furniture transfer training.  PM session focusing on bilat UE and LE ROM, strength and endurance training on Nustep at level 4 resistance x 5 min at 70 steps per minute but patient reported 15 on RPE scale, decreased to 60 steps per minute and patient able to go for 10 more minutes at 11-12 RPE; performed car transfer and stair training: patient niece unable to attend family education due to work schedule but patient able to verbalize safe sequence.  Patient has met 7 of 7 long term goals due to improved activity tolerance, improved balance, increased strength, increased range of motion, functional use of  right lower extremity and left lower extremity and gait sequence and velocity.  Patient to discharge at an ambulatory level Modified Independent.   Patient's care partner unavailable to complete family education but patient is mod I for transfers and ambulation and can verbalize safe sequence for stairs and car transfer to allow niece to provide the necessary supervisory assistance at discharge.  Recommendation:  Patient will benefit from ongoing skilled PT services in home health setting to continue to advance safe functional mobility, address ongoing impairments in LE strength, standing balance without UE support, gait, and to further minimize fall risk and return patient to previous level of function without AD.  Equipment: No equipment provided  Patient/family agrees with progress made and goals achieved: Yes  [name] Faucette 07/12/2011, 12:55  PM

## 2011-07-12 NOTE — Progress Notes (Signed)
Patient yelling with pain to LLE. Encouraged patient to take meds on a more regular basis. Last Ultracet was taken at 1032. Robaxin given at 2022 with minimal relief. Ultracet given at 2154. Will monitor effectiveness. Coletta Memos

## 2011-07-12 NOTE — Progress Notes (Signed)
Pt escorted to private vehicle via wc by Lennette Bihari, CNA. Niece at side.

## 2011-07-12 NOTE — Discharge Summary (Signed)
NAMENOAM, MAPLES NO.:  [id number]  MEDICAL RECORD NO.:  [id number]  LOCATION:  [id number]                         FACILITY:  [facility]  PHYSICIAN:  Charlett Blake, M.D.DATE OF BIRTH:  1951/06/16  DATE OF ADMISSION:  07/01/2011 DATE OF DISCHARGE:  07/12/2011                              DISCHARGE SUMMARY   DISCHARGE DIAGNOSES: 1. Deconditioning due to fall with rhabdomyolysis and severe diabetic     neuropathy. 2. Renal insufficiency, resolved. 3. Diabetes mellitus type 2. 4. Hypertension. 5. Obstructive sleep apnea. 6. Rheumatoid arthritis. 7. Cellulitis left lower extremity, resolved. 8. Peripheral edema. 9. Schizophrenia.  HISTORY OF PRESENT ILLNESS:  [name] is a 60 year old female with history of DM type 2 with neuropathy and foot ulcers, fibromyalgia, spinal stenosis who fell on June 27, 2011, and was unable to get up until her niece arrived at home 9 hours later.  The patient with generalized weakness for the past few months with increased falls. Noted to have left lower extremity cellulitis and was started on antibiotics for treatment.  She was also noted to have rhabdomyolysis with CK at 2112 and was treated with gentle hydration.  Bilateral lower extremity Dopplers done were negative for DVT.  A PT evaluation was done on June 28, 2011, and the patient is noted to be limited by pain. MRI of lumbar spine done and Dr. Trenton Gammon was consulted for input.  He felt that the patient's left lower extremity pain was not due to spinal stenosis and, therefore, no surgical intervention needed.  The patient's falls were multifactorial likely due to polyneuropathy.  The patient is currently deconditioned and continues to have complaints of right chest wall pain.  Blood sugars are poorly controlled.  Walk was consulted for input of wound care for her left foot ulcers.  They recommended foam dressing and diabetic shoes to prevent friction and  worsening of foot ulcers.  PAST MEDICAL HISTORY:  Significant for allergies, anxiety, depression, diabetes mellitus, GERD, dyslipidemia, hypertension, schizophrenia, polypharmacy physical deconditioning, Barrett's esophagus with EEG done last on August 2007, nephrolithiasis, asthma, obstructive sleep apnea, CHF with EF of 55%, obesity, history of PE in 2010, gout, hypokalemia, fibromyalgia, vitamin D deficiency, steroid dependence, peripheral neuropathy, back pain and anemia.  PAST SURGICAL HISTORY:  Positive for endometrial biopsy, appendectomy, cholecystectomy and kidney surgery.  REVIEW OF SYSTEMS:  Positive for stasis changes bilateral lower extremity with erythema left hand and multiple dressings on bilateral feet.  Numbness bilateral lower extremity.  Positive for myalgias and falls due to lower extremity instability and numbness.  Positive for urgency, right chest and rib wall pain.  Chronic lower extremity edema and chronic blurred vision.  FAMILY HISTORY:  Positive for bone cancers and COPD.  SOCIAL HISTORY:  The patient lives with a niece.  Has been disabled due to schizophrenia and has not worked out of home.  She smoked for about a year as a teenager, has not used any tobacco for 39 years.  She does not use any smokeless tobacco.  Does not use any alcohol.  PHYSICAL EXAMINATION:  VITALS:  Blood pressure 124/75, pulse 82, respirations 20. GENERAL:  The patient is a well-nourished, well-developed female, appears  older than stated age.  HEENT:  Atraumatic normocephalic.  Oral mucosa is pink and moist with borderline dentition.  Pupils equal, round and reactive to light. NECK:  Supple without masses. LUNGS:  Clear to auscultation bilaterally without wheezes or rales. HEART:  Regular rate and rhythm without murmurs, gallops or rubs. ABDOMEN:  Soft and nontender with positive bowel sounds. MUSCULOSKELETAL:  The patient had decreased range of motion of left knee.  Noted to  have swelling, ecchymoses and erythema left shin with an ulcer medial aspect of left shin.  Bilateral shins with evidence of stasis changes.  The patient with ulcers plantar surface in bilateral great toes.  NEUROLOGIC:  The patient with sensory deficits, hypersensitivity and dysesthesias noted left shin and bilateral feet to touch.  Some weakness left lower extremity.  Pain and tenderness left calf to palpation which has been ongoing.  HOSPITAL COURSE:  [name] was admitted to rehab on July 01, 2011, for inpatient therapies to consist of PT, OT at least 3 hours 5 days a week.  Past admission physiatrist, rehab, RN and therapy team have worked together to provide customized collaborative interdisciplinary care.  Rehab RN has worked with the patient on bowel and bladder program as well as safety.  They have also worked on wound management and diabetic education.  The patient's blood pressures were checked on b.i.d. basis and these are currently ranging from 0000000 to 0000000 systolic 0000000 [id number] diastolic.  The patient's furosemide was held throughout her stay.  She was noted to develop edema in her lower extremities.  Support stockings as well as elevations were recommended for use and to assist with edema control.  Statin and metformin were initially placed on hold.  Followup labs were done past admission for monitoring of patient's renal status.  LABORATORY DATA:  Labs of July 02, 2011, showed renal status to be stable with BUN at 21 and creatinine at 0.94.  Check of CBC of 1016 revealed H and H at 10.7 and 31.2, white count 9.4, platelets 142.  She was maintained on iron supplements throughout her stay.  As renal status is stable, her statin and metformin was resumed.  Recheck lytes of July 05, 2011, revealed sodium 138, potassium 3.5, chloride 105, CO2 25, BUN 15, creatinine 0.74, glucose 129.  The patient's CBG's have been checked on a.c. at bedtime basis and these  are reasonably controlled ranging from 100-130 range.  P.o. intake has been good.  Her ulcers are noted to be clean and dry.  Foam dressings have been used throughout his stay.  Cellulitis has resolved.  She was treated with 10 days of the p.o. doxycycline during this stay.  The patient has been advised about monitoring low-salt diet to help with edema control.  She remains of off furosemide and is to follow up with primary MD about resuming her diuretics.  UA was done due to issues with frequency.  This revealed 50,000 colonies of E. Coli (ESBL).  As sensitivities unavailable and the patient Afebrile,  This was treated as colonization.  During patient's stay in rehab, weekly team conferences were held to monitor the patient's progress, set goals, as well as discuss barriers to discharge.  Physical therapy has been working with the patient on dynamic balance as well as strengthening and overall mobility.  The patient was noted to have delayed ankle strategy with requiring cues to use hip strategies for posterior loss of balance.  Currently, the patient has progress to being at modified  independent level for transfers, modified independent for ambulation of household distance with use of walker.  She has shown improved activity tolerance, improved balance as well as improvement in gait sequence and velocity.  The patient's family was not available for family education.  However, the patient can verbalized a sequencing for stair and car transfers to allow Niece to provide necessary supervisory assistance past discharge.  OT has worked with the patient on ADL task.  The patient is currently able to walk in shower with distant supervision with use of grab bars and a rolling walker.  She is able to perform bathing and dressing at modified independent level with use of long handled sponge and reacher to complete lower body care.  She does require assistance to don TED hose and the patient's  niece will assist with this.  Further follow up home health, PT, OT has been set up through [facility].  Additionally, home health RN has been set up for wound care monitoring.  On July 12, 2011, the patient is discharged to home.  DISCHARGE MEDICATIONS: 1. Lantus insulin 20 units subcu q.a.m. 2. Lidocaine patch to right chest wall on at 7 a.m., off at 7 p.m.     daily. 3. Ultracet 37.5-325 one p.o. q.i.d. p.r.n. pain. 4. Albuterol inhaler 1 puff q.4 h. p.r.n. shortness of breath and     albuterol nebulizers p.r.n. wheezing. 5. Allopurinol 300 mg b.i.d. 6. Zyrtec 10 mg p.o. per day. 7. Clonidine 0.2 mg 2 tabs p.o. nightly. 8. Doxepin 125 mg p.o. nightly. 9. Celexa 10 mg p.o. per day. 10.Nexium 40 mg p.o. per day. 11.Ferrous sulfate 325 mg t.i.d. 12.Advair 500/50 b.i.d. inhaled. 13.Folic acid 1 mg p.o. per day. 14.Plaquenil 200 mg b.i.d. 15.Metformin 500 mg b.i.d. 16.Methotrexate 10 mg p.o. on Saturdays and Sundays. 17.Prednisone 5 mg p.o. per day. 18.Lyrica 100 mg p.o. per day. 19.Zocor 10 mg p.o. nightly.  DIET:  Carb modified medium with low-salt restrictions.  SPECIAL INSTRUCTIONS:  To not use Norco or furosemide or potassium chloride.  Note change in Lantus insulin.  Use NovoLog per home sliding scale protocol.  FOLLOWUP:  The patient to follow up with Dr. Lynne Leader in 7-10 days for routine medical check.  Follow up with Dr. Naaman Plummer as needed.   Activity level: Intermittent supervision, walk using a walker and no alcohol.     [name], P.A.   ______________________________ Charlett Blake, M.D.    PL/MEDQ  D:  07/12/2011  T:  07/12/2011  Job:  [id number]

## 2011-07-12 NOTE — Progress Notes (Signed)
Patient Details  Name: Jobina Kollars MRN: [id number] Date of Birth: 01/13/1951  Today's Date: 07/12/2011 Time:11:00-11:30  Skilled Therapeutic Interventions/Progress Updates: Pt ambulated to dayroom using RW with Mod I.  Pt required extra time but was able to complete task safely negotiating unanticipated obstacles without cuing.  Pt problem solved safe techniques to carry needed items to dayroom using walker bag.  Pt able to set-up and complete activity of choice with Mod I.  No c/o pain.  Therapy/Group: Individual Therapy  Activity Level: Simple:  Level of assist: Modified Independent  Lennix Kneisel 07/12/2011, 11:20 AM

## 2011-07-12 NOTE — Progress Notes (Signed)
Occupational Therapy Session Note  Patient Details  Name: [name] MRN: [id number] Date of Birth: Oct 10, 1950  Today's Date: 07/12/2011 Time: [id number] Time Calculation (min): 60 min  Precautions: Precautions Precautions: Fall Precaution Comments: fall Required Braces or Orthoses: Yes (ace wrap for ulcer and diabetic shoes ordered) Restrictions Weight Bearing Restrictions: No  Short Term Goals: OT Short Term Goal 1: ModI bathing with nec AE shower level. OT Short Term Goal 2: Mod I dressing with nec AE sit - stand. OT Short Term Goal 3: Mod I toilet transfer and toileting RW level with nec DME. OT Short Term Goal 4: Mod I tubbench transfer RW level. OT Short Term Goal 5: simple home mgnt task RW level @ Mod I.  Skilled Therapeutic Interventions/Progress Updates:    Pt completed ADL retraining in walk-in shower with distant supervision transfer to tubbench with use of grabbars and RW.  Pt completed bathing and dsg at Mod I level with use of longhandled sponge and reacher to complete LB bathing and dsg with exception of this therapist donning TED hose.  Pt obtained all items with use of RW and RW bag.  Is at goal level with BADL/self-cares.  General   Vital Signs   Pain Pain Assessment Pain Assessment: 0-10 Pain Score:   3 ADL ADL Equipment Provided: Reacher Grooming: Modified independent Where Assessed-Grooming: Standing at sink Upper Body Bathing: Modified independent Where Assessed-Upper Body Bathing: Shower Lower Body Bathing: Modified independent Where Assessed-Lower Body Bathing: Shower Upper Body Dressing: Independent Where Assessed-Upper Body Dressing: Edge of bed Lower Body Dressing: Modified independent (mod I with LB dsg except assist to don TEDS) Where Assessed-Lower Body Dressing: Edge of bed Toileting: Modified independent Where Assessed-Toileting: Glass blower/designer: Modified Programmer, applications Method: Ambulating (with RW) Tub/Shower  Transfer: Moderate assistance Tub/Shower Transfer Method: Stand pivot Tub/Shower Equipment: Transfer tub bench;Grab bars Social research officer, government: Distant supervision Social research officer, government Method: Radiographer, therapeutic: Transfer tub bench;Grab bars ADL Comments: Very aware of safety. States L foot feels like she is trying to move a cinder block and has a difficult time moving the foot. ulcers B lat aspect of feet.  Therapy/Group: Individual Therapy  Sim Boast 07/12/2011, 9:59 AM

## 2011-07-12 NOTE — Progress Notes (Signed)
Patient ID: [name], female   DOB: 1951-04-03, 60 y.o.   MRN: [id number]   Subjective/Complaints: No new complaints. Nurse at bedside.Slept well.  R side chest sore with pushing WC or turning in bed.  Became excessively somnolent last pm after vicoden.  Pt states she took this before without problems but also had problem with codeine in past. Ultracet is helping with pain and has no Side effects.  Review of Systems  Genitourinary:       Retention  Neurological: Positive for sensory change.  All other systems reviewed and are negative.    Objective: Vital Signs: Blood pressure 115/69, pulse 75, temperature 97.8 F (36.6 C), temperature source Oral, resp. rate 18, height 5' (1.524 m), weight 91 kg (200 lb 9.9 oz), SpO2 95.00%. No results found. No results found for this basename: WBC:2,HGB:2,HCT:2,PLT:2 in the last 72 hours No results found for this basename: NA:2,K:2,CL:2,CO2:2,GLUCOSE:2,BUN:2,CREATININE:2,CALCIUM:2 in the last 72 hours  Physical Exam: General appearance: alert, cooperative and appears older than stated age Resp: clear to auscultation bilaterally Cardio: regular rate and rhythm GI: soft, non-tender; bowel sounds normal; no masses,  no organomegaly Extremities: Stasis changed BLE.  Cellulitis LLE improving with dry dressing on ulcer.  Continues with sensitivity         Left calf>left shin and bilateral feet. Skin: diabetic ulcers bilateral feet due to pressure and left shin Neurologic: alert and oriented X 3, LLE weakness.  Neuropathy BLE. Insensate feet  Assessment/Plan:  1. Functional deficits secondary to Fall, rhabdomyolysis, severe diabetic neuropathy which require 3+ hours per day of interdisciplinary therapy in a comprehensive inpatient rehab setting.Stable for D/C today Physiatrist is providing close team supervision and 24 hour management of active medical problems listed below. Physiatrist and rehab team continue to assess barriers to discharge/monitor  patient progress toward functional and medical goals. Team Conference today Mobility: Bed Mobility Bed Mobility: No Rolling Right: 6: Modified independent (Device/Increase time) Right Sidelying to Sit: 6: Modified independent (Device/Increase time) Supine to Sit: 6: Modified independent (Device/Increase time) Supine to Sit Details (indicate cue type and reason): Flat bed, no rail; pt required cues for sequence and safety secondary to attempting to pull on rail and therapist  Sit to Supine - Right: 6: Modified independent (Device/Increase time) Transfers Transfers: Yes Sit to Stand: 5: Supervision Sit to Stand Details (indicate cue type and reason): Use of sit to stand from w/c x 10 reps for technique training and strengthening; little carryover of technique though; patient still requires verbal cues for safe hand placement and LLE/foot placement  underneath her LE.  Stand to Sit: 5: Supervision Stand to Sit Details: vc's for safe hand placement Stand Pivot Transfers: 5: Supervision Stand Pivot Transfer Details (indicate cue type and reason): Stand pivot car transfer with RW with cues for safe hand placement and car transfer sequence.  Patient did require min A to bring LLE out of car secondary to decreased knee flexion Ambulation/Gait Ambulation/Gait Assistance: 6: Modified independent (Device/Increase time) Ambulation/Gait Assistance Details (indicate cue type and reason): less cues needed today for full stance on LLE and increased step/stride length and step through gait pattern; patient tends to return to step to gait pattern when distracted with conversation.   Ambulation Distance (Feet): 186 Feet (x2) Assistive device: Rolling walker Gait Pattern: Decreased step length - right;Decreased stride length;Decreased dorsiflexion - left;Decreased weight shift to left Gait velocity: 0.35 (With RW, cues to walk as fast as she can comfortably.)) Stairs: Yes Stairs Assistance: 4: Min  assist  Stairs Assistance Details (indicate cue type and reason): Patient reports she will have 2 rails at home; still requires verbal cues for safe sequence and tactile cues for anterior weight shift and weight acceptance on LLE and for eccentric lowering  Stair Management Technique: Two rails Number of Stairs: 4  Height of Stairs: 6  Wheelchair Mobility Wheelchair Mobility: No Wheelchair Assistance: 5: Careers information officer: Both upper extremities Wheelchair Parts Management: Needs assistance Distance: 100 ADL:   Cognition: Cognition Overall Cognitive Status: Impaired at baseline Arousal/Alertness: Awake/alert Orientation Level: Oriented X4 Attention: Alternating Alternating Attention: Appears intact Memory: Appears intact Awareness: Appears intact Problem Solving: Appears intact Executive Function:  (appears WFL for basic ADL) Safety/Judgment: Appears intact Cognition Arousal/Alertness: Awake/alert Orientation Level: Oriented X4      Medical Problem List and Plan:  1. DVT Prophylaxis/Anticoagulation: Pharmaceutical: Lovenox  2. Pain Management: prn Vicodin effective. 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  If progressive would repeat dopplers, cont lovenox and analgesics. 3. Mood: monitor for now. Very motivated to get better. Insomnia increase Sinequan dose to home dose of 125mg   Patient Active Hospital Problem List:  4. Rhabdomyolysis:  Assessment: CK improving recheck levels next week.  Renal status normalized today Plan:  Continue to push po fluids. Resume metformin.   5. Diabetes: Lantus/glucophage Assessment: check blood sugars ac/hs.Monitor closely while on steroid.  6. HTN: clonidine 0.4 mg qhs  No orthostasis. Monitor closely when up with activity.   7.OSA:  Assessment:compliant with CPAP. Slept better last night.  Plan: Continue CPAP. Monitor sleep patterns for now.  8. RA:   Assessment: Symptoms controlled on home regimen.  Plan: Continue hydroxychloroquine, methotrexate, folic acid and prednisone.  9. Cellulitis:  Assessment: Erythema and tenderness improving. IV vancomycin stopped today.  Plan: Recommendations to continue antibiotic for 14 days. On Doxy.  Off diuretics which will likely cause increase in peripheral edema with increased activity. Need to monitor for now. ? Order diabetic shoes.  10. Right chest wall pain Assessment: Musculoskeletal in nature. Chest xray negative for rib fracture.  Plan: Continue lidocaine patch as this is effective. D/C vicoden, trial ultracet, kpad to chest 11. Hypokalemia:  Assessment: Resolved with supplement.  Plan: Will hold supplement for now.  12. Gout:  Assessment: Monitor for symptoms with rhabdomyolysis.  Plan: Continue allopurinol.  13. BLE weakness:  Assessment: Multifactorial due to neuropathy, fibromyalgia, RA and deconditioning. ? Statin related.  Continue to hold statin and monitor. Plan: Physical therapy for strengthening. Reports LLE pain due to tightness/spasms.  16. Vitamin D deficiency:  Assessment: Low vitamin D levels @ 26. With ongoing weakness. Started on supplement  Plan: Continue 50,000 units weekly for now.  21. Asthma/ COPD hx:  Assessment: Cough.  Decreased wheezing  Plan: Continue advair. May need nebs scheduled. Tessalon helping with cough symptoms.  18. Constipation:  Assessment: Multiple stools with Miralax.  Plan: Decrease to once a day. Monitor stooling For C diff symptoms as antibiotics on board.  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  Plan: Continue doxepin, lexapro, and clonidine (helps with catatonia)  11.  Anemia: better  Continue iron. 12. Remote CVA with Left hemiparesis     Charlett Blake 07/12/2011, 7:58 AM              Patient ID: [name], female   DOB: 09-19-1950, 60 y.o.   MRN:  [id number]    Subjective/Complaints: No new complaints.  Slept well. No pain, SOB or CP  Objective: Vital Signs: Blood pressure 115/69, pulse 75, temperature 97.8 F (36.6 C), temperature source Oral, resp. rate 18, height 5' (1.524 m), weight 91 kg (200 lb 9.9 oz), SpO2 95.00%. No results found. No results found for this basename: WBC:2,HGB:2,HCT:2,PLT:2 in the last 72 hours No results found for this basename: NA:2,K:2,CL:2,CO2:2,GLUCOSE:2,BUN:2,CREATININE:2,CALCIUM:2 in the last 72 hours  Physical Exam: General appearance: alert, cooperative and appears older than stated age Resp: clear to auscultation bilaterally Cardio: regular rate and rhythm GI: soft, non-tender; bowel sounds normal; no masses,  no organomegaly Extremities: Stasis changed BLE.  Cellulitis LLE improving with dry dressing on ulcer.  Continues with sensitivity         Left calf>left shin and bilateral feet. Skin: diabetic ulcers bilateral feet due to pressure and left shin Neurologic: alert and oriented X 3, LLE weakness.  Neuropathy BLE.   Assessment/Plan:  1. Functional deficits secondary to Fall, rhabdomyolysis, severe diabetic neuropathy which require 3+ hours per day of interdisciplinary therapy in a comprehensive inpatient rehab setting. Physiatrist is providing close team supervision and 24 hour management of active medical problems listed below. Physiatrist and rehab team continue to assess barriers to discharge/monitor patient progress toward functional and medical goals. Team Conference today Mobility: Bed Mobility Bed Mobility: No Rolling Right: 6: Modified independent (Device/Increase time) Right Sidelying to Sit: 6: Modified independent (Device/Increase time) Supine to Sit: 6: Modified independent (Device/Increase time) Supine to Sit Details (indicate cue type and reason): Flat bed, no rail; pt required cues for sequence and safety secondary to attempting to pull on rail and therapist  Sit to Supine  - Right: 6: Modified independent (Device/Increase time) Transfers Transfers: Yes Sit to Stand: 5: Supervision Sit to Stand Details (indicate cue type and reason): Use of sit to stand from w/c x 10 reps for technique training and strengthening; little carryover of technique though; patient still requires verbal cues for safe hand placement and LLE/foot placement  underneath her LE.  Stand to Sit: 5: Supervision Stand to Sit Details: vc's for safe hand placement Stand Pivot Transfers: 5: Supervision Stand Pivot Transfer Details (indicate cue type and reason): Stand pivot car transfer with RW with cues for safe hand placement and car transfer sequence.  Patient did require min A to bring LLE out of car secondary to decreased knee flexion Ambulation/Gait Ambulation/Gait Assistance: 6: Modified independent (Device/Increase time) Ambulation/Gait Assistance Details (indicate cue type and reason): less cues needed today for full stance on LLE and increased step/stride length and step through gait pattern; patient tends to return to step to gait pattern when distracted with conversation.   Ambulation Distance (Feet): 186 Feet (x2) Assistive device: Rolling walker Gait Pattern: Decreased step length - right;Decreased stride length;Decreased dorsiflexion - left;Decreased weight shift to left Gait velocity: 0.35 (With RW, cues to walk as fast as she can comfortably.)) Stairs: Yes Stairs Assistance: 4: Min assist Stairs Assistance Details (indicate cue type and reason): Patient reports she will have 2 rails at home; still requires verbal cues for safe sequence and tactile cues for anterior weight shift and weight acceptance on LLE and for eccentric lowering  Stair Management Technique: Two rails Number of Stairs: 4  Height of Stairs: 6  Wheelchair Mobility Wheelchair Mobility: No Wheelchair Assistance: 5: Careers information officer: Both upper extremities Wheelchair Parts Management: Needs  assistance Distance: 100 ADL:   Cognition: Cognition Overall Cognitive Status: Impaired at baseline Arousal/Alertness: Awake/alert Orientation Level: Oriented X4 Attention: Alternating Alternating Attention:  Appears intact Memory: Appears intact Awareness: Appears intact Problem Solving: Appears intact Executive Function:  (appears WFL for basic ADL) Safety/Judgment: Appears intact  07/12/2011, 7:58 AM    Subjective/Complaints: No new complaints. Slept well. No pain, SOB or CP  Objective: Vital Signs: Blood pressure 115/69, pulse 75, temperature 97.8 F (36.6 C), temperature source Oral, resp. rate 18, height 5' (1.524 m), weight 91 kg (200 lb 9.9 oz), SpO2 95.00%. No results found. No results found for this basename: WBC:2,HGB:2,HCT:2,PLT:2 in the last 72 hours No results found for this basename: NA:2,K:2,CL:2,CO2:2,GLUCOSE:2,BUN:2,CREATININE:2,CALCIUM:2 in the last 72 hours  Physical Exam: General appearance: alert, cooperative and appears older than stated age Resp: clear to auscultation bilaterally Cardio: regular rate and rhythm GI: soft, non-tender; bowel sounds normal; no masses,  no organomegaly Extremities: Stasis changed BLE.  Cellulitis LLE improving with dry dressing on ulcer.  Continues with sensitivity         Left calf>left shin and bilateral feet. Skin: diabetic ulcers bilateral feet due to pressure and left shin Neurologic: alert and oriented X 3, LLE weakness.  Neuropathy BLE.   Assessment/Plan:  1. Functional deficits secondary to Fall, rhabdomyolysis, severe diabetic neuropathy which require 3+ hours per day of interdisciplinary therapy in a comprehensive inpatient rehab setting. Physiatrist is providing close team supervision and 24 hour management of active medical problems listed below. Physiatrist and rehab team continue to assess barriers to discharge/monitor patient progress toward functional and medical goals. Team Conference  today Mobility: Bed Mobility Bed Mobility: No Rolling Right: 6: Modified independent (Device/Increase time) Right Sidelying to Sit: 6: Modified independent (Device/Increase time) Supine to Sit: 6: Modified independent (Device/Increase time) Supine to Sit Details (indicate cue type and reason): Flat bed, no rail; pt required cues for sequence and safety secondary to attempting to pull on rail and therapist  Sit to Supine - Right: 6: Modified independent (Device/Increase time) Transfers Transfers: Yes Sit to Stand: 5: Supervision Sit to Stand Details (indicate cue type and reason): Use of sit to stand from w/c x 10 reps for technique training and strengthening; little carryover of technique though; patient still requires verbal cues for safe hand placement and LLE/foot placement  underneath her LE.  Stand to Sit: 5: Supervision Stand to Sit Details: vc's for safe hand placement Stand Pivot Transfers: 5: Supervision Stand Pivot Transfer Details (indicate cue type and reason): Stand pivot car transfer with RW with cues for safe hand placement and car transfer sequence.  Patient did require min A to bring LLE out of car secondary to decreased knee flexion Ambulation/Gait Ambulation/Gait Assistance: 6: Modified independent (Device/Increase time) Ambulation/Gait Assistance Details (indicate cue type and reason): less cues needed today for full stance on LLE and increased step/stride length and step through gait pattern; patient tends to return to step to gait pattern when distracted with conversation.   Ambulation Distance (Feet): 186 Feet (x2) Assistive device: Rolling walker Gait Pattern: Decreased step length - right;Decreased stride length;Decreased dorsiflexion - left;Decreased weight shift to left Gait velocity: 0.35 (With RW, cues to walk as fast as she can comfortably.)) Stairs: Yes Stairs Assistance: 4: Min assist Stairs Assistance Details (indicate cue type and reason): Patient reports  she will have 2 rails at home; still requires verbal cues for safe sequence and tactile cues for anterior weight shift and weight acceptance on LLE and for eccentric lowering  Stair Management Technique: Two rails Number of Stairs: 4  Height of Stairs: 6  Wheelchair Mobility Wheelchair Mobility: No Wheelchair Assistance: 5:  Supervision Wheelchair Propulsion: Both upper extremities Wheelchair Parts Management: Needs assistance Distance: 100 ADL:   Cognition: Cognition Overall Cognitive Status: Impaired at baseline Arousal/Alertness: Awake/alert Orientation Level: Oriented X4 Attention: Alternating Alternating Attention: Appears intact Memory: Appears intact Awareness: Appears intact Problem Solving: Appears intact Executive Function:  (appears WFL for basic ADL) Safety/Judgment: Appears intact Cognition Arousal/Alertness: Awake/alert Orientation Level: Oriented X4      Medical Problem List and Plan:  1. DVT Prophylaxis/Anticoagulation: Pharmaceutical: Lovenox  2. Pain Management: prn Vicodin effective. 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  If progressive would repeat dopplers, cont lovenox and analgesics. 3. Mood: monitor for now. Very motivated to get better. Insomnia increase Sinequan dose to home dose of 125mg   Patient Active Hospital Problem List:  4. Rhabdomyolysis:  Assessment: CK improving recheck levels next week.  Renal status normalized today Plan:  Continue to push po fluids. Resume metformin.   5. Diabetes:  Assessment: blood sugars poorly controlled due to stress dose steriods. Prednisone decreased to home dose today.  Plan: Monitor with AC/HS CBG checks. Continue lantus bid. Increase meal coverge for tighter control.  Resume metformin. 6. HTN:  Assessment:resonalble control currently. Ace added due to DM/ for renal protection today.  Plan: Monitor renal status with ARB on board. Off lasix and  spironolactone currently. Resume clonidine for schizophrenia.  7.OSA:  Assessment:compliant with CPAP. Slept better last night.  Plan: Continue CPAP. Monitor sleep patterns for now.  8. RA:  Assessment: Symptoms controlled on home regimen.  Plan: Continue hydroxychloroquine, methotrexate, folic acid and prednisone.  9. Cellulitis:  Assessment: Erythema and tenderness improving. IV vancomycin stopped today.  Plan: Recommendations to continue antibiotic for 14 days. On Doxy.  Off diuretics which will likely cause increase in peripheral edema with increased activity. Need to monitor for now. ? Order diabetic shoes.  10. Right chest wall pain Assessment: Musculoskeletal in nature. Chest xray negative for rib fracture.  Plan: Continue lidocaine patch as this is effective.  11. Hypokalemia:  Assessment: Resolved with supplement.  Plan: Will hold supplement for now.  12. Gout:  Assessment: Monitor for symptoms with rhabdomyolysis.  Plan: Continue allopurinol.  13. BLE weakness:  Assessment: Multifactorial due to neuropathy, fibromyalgia, RA and deconditioning. ? Statin related.  Continue to hold statin and monitor. Plan: Physical therapy for strengthening. Reports LLE pain due to tightness/spasms.  16. Vitamin D deficiency:  Assessment: Low vitamin D levels @ 26. With ongoing weakness. Started on supplement  Plan: Continue 50,000 units weekly for now.  31. Asthma/ COPD hx:  Assessment: Cough.  Decreased wheezing  Plan: Continue advair. May need nebs scheduled. Tessalon helping with cough symptoms.  18. Constipation:  Assessment: Multiple stools with Miralax.  Plan: Decrease to once a day. Monitor stooling For C diff symptoms as antibiotics on board.  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  Plan: Continue doxepin, lexapro, and clonidine (helps with catatonia)  11.  Anemia: better  Continue iron. 12. Remote CVA with Left  hemiparesis     Deangleo Passage E 07/12/2011, 7:58 AM

## 2011-07-13 NOTE — Telephone Encounter (Signed)
Fwd. To Dr.Corey

## 2011-07-13 NOTE — Telephone Encounter (Signed)
Received

## 2011-07-13 NOTE — Discharge Summary (Signed)
NAMECOCO, Kathleen NO.:  [id number]  MEDICAL RECORD NO.:  [id number]  LOCATION:  [id number]                         FACILITY:  [facility]  PHYSICIAN:  Charlett Blake, M.D.DATE OF BIRTH:  02-20-1951  DATE OF ADMISSION:  07/01/2011 DATE OF DISCHARGE:  07/12/2011                              DISCHARGE SUMMARY   DISCHARGE DIAGNOSES: 1. Deconditioning due to fall with rhabdomyolysis and severe diabetic     neuropathy. 2. Renal insufficiency, resolved. 3. Diabetes mellitus type 2. 4. Hypertension. 5. Obstructive sleep apnea. 6. Rheumatoid arthritis. 7. Cellulitis left lower extremity, resolved. 8. Peripheral edema. 9. Schizophrenia.  HISTORY OF PRESENT ILLNESS:  [name] is a 60 year old female with history of DM type 2 with neuropathy and foot ulcers, fibromyalgia, spinal stenosis who fell on June 27, 2011, and was unable to get up until her niece arrived at home 9 hours later.  The patient with generalized weakness for the past few months with increased falls. Noted to have left lower extremity cellulitis and was started on antibiotics for treatment.  She was also noted to have rhabdomyolysis with CK at 2112 and was treated with gentle hydration.  Bilateral lower extremity Dopplers done were negative for DVT.  A PT evaluation was done on June 28, 2011, and the patient is noted to be limited by pain. MRI of lumbar spine done and Dr. Trenton Gammon was consulted for input.  He felt that the patient's left lower extremity pain was not due to spinal stenosis and, therefore, no surgical intervention needed.  The patient's falls were multifactorial likely due to polyneuropathy.  The patient is currently deconditioned and continues to have complaints of right chest wall pain.  Blood sugars are poorly controlled.  Walk was consulted for input of wound care for her left foot ulcers.  They recommended foam dressing and diabetic shoes to prevent friction and  worsening of foot ulcers.  PAST MEDICAL HISTORY:  Significant for allergies, anxiety, depression, diabetes mellitus, GERD, dyslipidemia, hypertension, schizophrenia, polypharmacy physical deconditioning, Barrett's esophagus with EEG done last on August 2007, nephrolithiasis, asthma, obstructive sleep apnea, CHF with EF of 55%, obesity, history of PE in 2010, gout, hypokalemia, fibromyalgia, vitamin D deficiency, steroid dependence, peripheral neuropathy, back pain and anemia.  PAST SURGICAL HISTORY:  Positive for endometrial biopsy, appendectomy, cholecystectomy and kidney surgery.  REVIEW OF SYSTEMS:  Positive for stasis changes bilateral lower extremity with erythema left hand and multiple dressings on bilateral feet.  Numbness bilateral lower extremity.  Positive for myalgias and falls due to lower extremity instability and numbness.  Positive for urgency, right chest and rib wall pain.  Chronic lower extremity edema and chronic blurred vision.  FAMILY HISTORY:  Positive for bone cancers and COPD.  SOCIAL HISTORY:  The patient lives with a niece.  Has been disabled due to schizophrenia and has not worked out of home.  She smoked for about a year as a teenager, has not used any tobacco for 39 years.  She does not use any smokeless tobacco.  Does not use any alcohol.  PHYSICAL EXAMINATION:  VITALS:  Blood pressure 124/75, pulse 82, respirations 20. GENERAL:  The patient is a well-nourished, well-developed female, appears  older than stated age.  HEENT:  Atraumatic normocephalic.  Oral mucosa is pink and moist with borderline dentition.  Pupils equal, round and reactive to light. NECK:  Supple without masses. LUNGS:  Clear to auscultation bilaterally without wheezes or rales. HEART:  Regular rate and rhythm without murmurs, gallops or rubs. ABDOMEN:  Soft and nontender with positive bowel sounds. MUSCULOSKELETAL:  The patient had decreased range of motion of left knee.  Noted to  have swelling, ecchymoses and erythema left shin with an ulcer medial aspect of left shin.  Bilateral shins with evidence of stasis changes.  The patient with ulcers plantar surface in bilateral great toes.  NEUROLOGIC:  The patient with sensory deficits, hypersensitivity and dysesthesias noted left shin and bilateral feet to touch.  Some weakness left lower extremity.  Pain and tenderness left calf to palpation which has been ongoing.  HOSPITAL COURSE:  [name] was admitted to rehab on July 01, 2011, for inpatient therapies to consist of PT, OT at least 3 hours 5 days a week.  Past admission physiatrist, rehab, RN and therapy team have worked together to provide customized collaborative interdisciplinary care.  Rehab RN has worked with the patient on bowel and bladder program as well as safety.  They have also worked on wound management and diabetic education.  The patient's blood pressures were checked on b.i.d. basis and these are currently ranging from 0000000 to 0000000 systolic 0000000 [id number] diastolic.  The patient's furosemide was held throughout her stay.  She was noted to develop edema in her lower extremities.  Support stockings as well as elevations were recommended for use and to assist with edema control.  Statin and metformin were initially placed on hold.  Followup labs were done past admission for monitoring of patient's renal status.  LABORATORY DATA:  Labs of July 02, 2011, showed renal status to be stable with BUN at 21 and creatinine at 0.94.  Check of CBC of 1016 revealed H and H at 10.7 and 31.2, white count 9.4, platelets 142.  She was maintained on iron supplements throughout her stay.  As renal status is stable, her statin and metformin was resumed.  Recheck lytes of July 05, 2011, revealed sodium 138, potassium 3.5, chloride 105, CO2 25, BUN 15, creatinine 0.74, glucose 129.  The patient's CBG's have been checked on a.c. at bedtime basis and these  are reasonably controlled ranging from 100-130 range.  P.o. intake has been good.  Her ulcers are noted to be clean and dry.  Foam dressings have been used throughout his stay.  Cellulitis has resolved.  She was treated with 10 days of the p.o. doxycycline during this stay.  The patient has been advised about monitoring low-salt diet to help with edema control.  She remains of off furosemide and is to follow up with primary MD about resuming her diuretics.  UA was done due to issues with frequency.  This revealed 50,000 colonies of E. Coli (ESBL).  As sensitivities unavailable and the patient Afebrile,  This was treated as colonization.  During patient's stay in rehab, weekly team conferences were held to monitor the patient's progress, set goals, as well as discuss barriers to discharge.  Physical therapy has been working with the patient on dynamic balance as well as strengthening and overall mobility.  The patient was noted to have delayed ankle strategy with requiring cues to use hip strategies for posterior loss of balance.  Currently, the patient has progress to being at modified  independent level for transfers, modified independent for ambulation of household distance with use of walker.  She has shown improved activity tolerance, improved balance as well as improvement in gait sequence and velocity.  The patient's family was not available for family education.  However, the patient can verbalized a sequencing for stair and car transfers to allow Niece to provide necessary supervisory assistance past discharge.  OT has worked with the patient on ADL task.  The patient is currently able to walk in shower with distant supervision with use of grab bars and a rolling walker.  She is able to perform bathing and dressing at modified independent level with use of long handled sponge and reacher to complete lower body care.  She does require assistance to don TED hose and the patient's  niece will assist with this.  Further follow up home health, PT, OT has been set [facility] through [facility].  Additionally, home health RN has been set up for wound care monitoring.  On July 12, 2011, the patient is discharged to home.  DISCHARGE MEDICATIONS: 1. Lantus insulin 20 units subcu q.a.m. 2. Lidocaine patch to right chest wall on at 7 a.m., off at 7 p.m.     daily. 3. Ultracet 37.5-325 one p.o. q.i.d. p.r.n. pain. 4. Albuterol inhaler 1 puff q.4 h. p.r.n. shortness of breath and     albuterol nebulizers p.r.n. wheezing. 5. Allopurinol 300 mg b.i.d. 6. Zyrtec 10 mg p.o. per day. 7. Clonidine 0.2 mg 2 tabs p.o. nightly. 8. Doxepin 125 mg p.o. nightly. 9. Celexa 10 mg p.o. per day. 10.Nexium 40 mg p.o. per day. 11.Ferrous sulfate 325 mg t.i.d. 12.Advair 500/50 b.i.d. inhaled. 13.Folic acid 1 mg p.o. per day. 14.Plaquenil 200 mg b.i.d. 15.Metformin 500 mg b.i.d. 16.Methotrexate 10 mg p.o. on Saturdays and Sundays. 17.Prednisone 5 mg p.o. per day. 18.Lyrica 100 mg p.o. per day. 19.Zocor 10 mg p.o. nightly.  DIET:  Carb modified medium with low-salt restrictions.  SPECIAL INSTRUCTIONS:  To not use Norco or furosemide or potassium chloride.  Note change in Lantus insulin.  Use NovoLog per home sliding scale protocol.  FOLLOWUP:  The patient to follow up with Dr. Lynne Leader in 7-10 days for routine medical check.  Follow up with Dr. Letta Pate as needed.   Activity level: Intermittent supervision, walk using a walker and no alcohol.     Reesa Chew, P.A.   ______________________________ Charlett Blake, M.D.    PL/MEDQ  D:  07/12/2011  T:  07/12/2011  Job:  [id number]

## 2011-07-13 NOTE — Telephone Encounter (Signed)
Has this been addressed?

## 2011-07-15 ENCOUNTER — Other Ambulatory Visit: Payer: Self-pay | Admitting: Family Medicine

## 2011-07-15 MED ORDER — METFORMIN HCL 500 MG PO TABS: 500.0000 mg | ORAL_TABLET | Freq: Two times a day (BID) | ORAL | Status: DC

## 2011-07-15 NOTE — Telephone Encounter (Signed)
Refill request

## 2011-07-19 ENCOUNTER — Other Ambulatory Visit: Payer: Self-pay | Admitting: Family Medicine

## 2011-07-19 NOTE — Telephone Encounter (Signed)
Refill request

## 2011-07-20 ENCOUNTER — Other Ambulatory Visit: Payer: Self-pay | Admitting: Family Medicine

## 2011-07-20 MED ORDER — POTASSIUM CHLORIDE CRYS ER 20 MEQ PO TBCR: EXTENDED_RELEASE_TABLET | ORAL | Status: DC

## 2011-07-26 NOTE — Telephone Encounter (Signed)
Refill request

## 2011-07-27 ENCOUNTER — Other Ambulatory Visit: Payer: Self-pay | Admitting: Family Medicine

## 2011-07-27 ENCOUNTER — Telehealth: Payer: Self-pay | Admitting: *Deleted

## 2011-07-27 MED ORDER — PREGABALIN 100 MG PO CAPS: 100.0000 mg | ORAL_CAPSULE | Freq: Three times a day (TID) | ORAL | Status: DC

## 2011-07-27 NOTE — Telephone Encounter (Signed)
completed

## 2011-07-27 NOTE — Telephone Encounter (Signed)
PA required for Nexium. Form placed in MD box. 

## 2011-07-29 ENCOUNTER — Telehealth: Payer: Self-pay | Admitting: *Deleted

## 2011-07-29 DIAGNOSIS — L97509 Non-pressure chronic ulcer of other part of unspecified foot with unspecified severity: Secondary | ICD-10-CM

## 2011-07-29 NOTE — Telephone Encounter (Signed)
Need orders to continue wound care services.  Will route message to PCP.

## 2011-07-29 NOTE — Telephone Encounter (Signed)
Received denial from medicaid for Nexium. Notice states patient must have tried and failed 2 preferred medications.  Denial placed in MD box.

## 2011-07-30 MED ORDER — PANTOPRAZOLE SODIUM 40 MG PO TBEC: 40.0000 mg | DELAYED_RELEASE_TABLET | Freq: Two times a day (BID) | ORAL | Status: DC

## 2011-07-30 NOTE — Telephone Encounter (Signed)
Addended by: Gregor Hams on: 07/30/2011 12:00 PM   Modules accepted: Orders

## 2011-07-30 NOTE — Telephone Encounter (Signed)
Second referral placed.

## 2011-07-30 NOTE — Telephone Encounter (Signed)
Will have to switch to protonix.

## 2011-08-03 NOTE — Telephone Encounter (Signed)
Referral for Wound care faxed to [facility], ATTN:  Duwayne Heck, RN.  [phone].  Lauralyn Primes

## 2011-08-05 ENCOUNTER — Other Ambulatory Visit: Payer: Self-pay | Admitting: Family Medicine

## 2011-08-05 MED ORDER — HYDROCHLOROTHIAZIDE 25 MG PO TABS: 25.0000 mg | ORAL_TABLET | Freq: Two times a day (BID) | ORAL | Status: DC

## 2011-08-11 ENCOUNTER — Other Ambulatory Visit: Payer: Self-pay | Admitting: Family Medicine

## 2011-08-11 MED ORDER — INSULIN GLARGINE 100 UNIT/ML ~~LOC~~ SOLN: 20.0000 [IU] | Freq: Every day | SUBCUTANEOUS | Status: DC

## 2011-09-15 ENCOUNTER — Other Ambulatory Visit: Payer: Self-pay | Admitting: Family Medicine

## 2011-09-15 MED ORDER — INSULIN GLARGINE 100 UNIT/ML ~~LOC~~ SOLN: 20.0000 [IU] | Freq: Every day | SUBCUTANEOUS | Status: DC

## 2011-09-22 ENCOUNTER — Other Ambulatory Visit: Payer: Self-pay | Admitting: Family Medicine

## 2011-09-30 ENCOUNTER — Telehealth: Payer: Self-pay | Admitting: Family Medicine

## 2011-09-30 NOTE — Telephone Encounter (Signed)
Will fwd. To Dr.Corey to address .[name]

## 2011-09-30 NOTE — Telephone Encounter (Signed)
Nurse is seeing the patient for wound care.  She has 3 new places on left anterior leg, redness and warmth.  They do not have transportation at this time and the nurse was hoping that a antibiotic could be called in.  She is also requesting a Wound Care nurse go out to asses the different wounds the patient has.

## 2011-10-01 MED ORDER — MINOCYCLINE HCL 100 MG PO TABS: 100.0000 mg | ORAL_TABLET | Freq: Two times a day (BID) | ORAL | Status: DC

## 2011-10-01 NOTE — Telephone Encounter (Signed)
I would rather see this patient in the office prior to prescribing antibiotics, however I appreciate the situation.   Plan to call in minocycline twice daily for one week, and well to verbal order for wound care nurse to visit patient. Additionally I would like to patient to come in and be evaluated at the family practice Center as soon as possible. Routing  this message to red team

## 2011-10-01 NOTE — Telephone Encounter (Signed)
Home health nurse called back and was given message from below - they are having transportation problems right now, but should be better by next week.  They will call asap for an appt. Amy - nurse at [facility] will be checking back in after wound care.

## 2011-10-05 ENCOUNTER — Emergency Department (HOSPITAL_COMMUNITY): Payer: Medicaid Other

## 2011-10-05 ENCOUNTER — Emergency Department (HOSPITAL_COMMUNITY)
Admission: EM | Admit: 2011-10-05 | Discharge: 2011-10-05 | Disposition: A | Discharge status: Home Health | Payer: Medicaid Other | Attending: Emergency Medicine | Admitting: Emergency Medicine

## 2011-10-05 ENCOUNTER — Encounter (HOSPITAL_COMMUNITY): Payer: Self-pay | Admitting: Emergency Medicine

## 2011-10-05 DIAGNOSIS — I509 Heart failure, unspecified: Secondary | ICD-10-CM | POA: Insufficient documentation

## 2011-10-05 DIAGNOSIS — R112 Nausea with vomiting, unspecified: Secondary | ICD-10-CM | POA: Insufficient documentation

## 2011-10-05 DIAGNOSIS — IMO0001 Reserved for inherently not codable concepts without codable children: Secondary | ICD-10-CM | POA: Insufficient documentation

## 2011-10-05 DIAGNOSIS — L97509 Non-pressure chronic ulcer of other part of unspecified foot with unspecified severity: Secondary | ICD-10-CM | POA: Insufficient documentation

## 2011-10-05 DIAGNOSIS — E119 Type 2 diabetes mellitus without complications: Secondary | ICD-10-CM | POA: Insufficient documentation

## 2011-10-05 DIAGNOSIS — R5381 Other malaise: Secondary | ICD-10-CM | POA: Insufficient documentation

## 2011-10-05 DIAGNOSIS — Z79899 Other long term (current) drug therapy: Secondary | ICD-10-CM | POA: Insufficient documentation

## 2011-10-05 DIAGNOSIS — Z86711 Personal history of pulmonary embolism: Secondary | ICD-10-CM | POA: Insufficient documentation

## 2011-10-05 DIAGNOSIS — G609 Hereditary and idiopathic neuropathy, unspecified: Secondary | ICD-10-CM | POA: Insufficient documentation

## 2011-10-05 DIAGNOSIS — L97809 Non-pressure chronic ulcer of other part of unspecified lower leg with unspecified severity: Secondary | ICD-10-CM | POA: Insufficient documentation

## 2011-10-05 DIAGNOSIS — I10 Essential (primary) hypertension: Secondary | ICD-10-CM | POA: Insufficient documentation

## 2011-10-05 DIAGNOSIS — F411 Generalized anxiety disorder: Secondary | ICD-10-CM | POA: Insufficient documentation

## 2011-10-05 DIAGNOSIS — E785 Hyperlipidemia, unspecified: Secondary | ICD-10-CM | POA: Insufficient documentation

## 2011-10-05 DIAGNOSIS — K219 Gastro-esophageal reflux disease without esophagitis: Secondary | ICD-10-CM | POA: Insufficient documentation

## 2011-10-05 DIAGNOSIS — J45909 Unspecified asthma, uncomplicated: Secondary | ICD-10-CM | POA: Insufficient documentation

## 2011-10-05 DIAGNOSIS — IMO0002 Reserved for concepts with insufficient information to code with codable children: Secondary | ICD-10-CM | POA: Insufficient documentation

## 2011-10-05 DIAGNOSIS — K2270 Barrett's esophagus without dysplasia: Secondary | ICD-10-CM | POA: Insufficient documentation

## 2011-10-05 DIAGNOSIS — F329 Major depressive disorder, single episode, unspecified: Secondary | ICD-10-CM | POA: Insufficient documentation

## 2011-10-05 DIAGNOSIS — N390 Urinary tract infection, site not specified: Secondary | ICD-10-CM | POA: Insufficient documentation

## 2011-10-05 DIAGNOSIS — Z794 Long term (current) use of insulin: Secondary | ICD-10-CM | POA: Insufficient documentation

## 2011-10-05 DIAGNOSIS — F3289 Other specified depressive episodes: Secondary | ICD-10-CM | POA: Insufficient documentation

## 2011-10-05 DIAGNOSIS — E669 Obesity, unspecified: Secondary | ICD-10-CM | POA: Insufficient documentation

## 2011-10-05 DIAGNOSIS — R531 Weakness: Secondary | ICD-10-CM

## 2011-10-05 LAB — COMPREHENSIVE METABOLIC PANEL
ALT: 23 U/L (ref 0–35)
AST: 19 U/L (ref 0–37)
Albumin: 3.4 g/dL — ABNORMAL LOW (ref 3.5–5.2)
Alkaline Phosphatase: 99 U/L (ref 39–117)
BUN: 37 mg/dL — ABNORMAL HIGH (ref 6–23)
CO2: 28 mEq/L (ref 19–32)
Calcium: 10 mg/dL (ref 8.4–10.5)
Chloride: 94 mEq/L — ABNORMAL LOW (ref 96–112)
Creatinine, Ser: 1.19 mg/dL — ABNORMAL HIGH (ref 0.50–1.10)
GFR calc Af Amer: 57 mL/min — ABNORMAL LOW (ref >90)
GFR calc non Af Amer: 49 mL/min — ABNORMAL LOW (ref >90)
Glucose, Bld: 315 mg/dL — ABNORMAL HIGH (ref 70–99)
Potassium: 3.3 mEq/L — ABNORMAL LOW (ref 3.5–5.1)
Sodium: 134 mEq/L — ABNORMAL LOW (ref 135–145)
Total Bilirubin: 0.9 mg/dL (ref 0.3–1.2)
Total Protein: 7.2 g/dL (ref 6.0–8.3)

## 2011-10-05 LAB — DIFFERENTIAL
Basophils Absolute: 0 10*3/uL (ref 0.0–0.1)
Basophils Relative: 0 % (ref 0–1)
Eosinophils Absolute: 0 10*3/uL (ref 0.0–0.7)
Eosinophils Relative: 0 % (ref 0–5)
Lymphocytes Relative: 7 % — ABNORMAL LOW (ref 12–46)
Lymphs Abs: 0.8 10*3/uL (ref 0.7–4.0)
Monocytes Absolute: 0.4 10*3/uL (ref 0.1–1.0)
Monocytes Relative: 4 % (ref 3–12)
Neutro Abs: 9.6 10*3/uL — ABNORMAL HIGH (ref 1.7–7.7)
Neutrophils Relative %: 89 % — ABNORMAL HIGH (ref 43–77)

## 2011-10-05 LAB — POCT I-STAT TROPONIN I: Troponin i, poc: 0 ng/mL (ref 0.00–0.08)

## 2011-10-05 LAB — URINALYSIS, ROUTINE W REFLEX MICROSCOPIC
Bilirubin Urine: NEGATIVE
Glucose, UA: NEGATIVE mg/dL
Ketones, ur: NEGATIVE mg/dL
Nitrite: NEGATIVE
Protein, ur: NEGATIVE mg/dL
Specific Gravity, Urine: 1.01 (ref 1.005–1.030)
Urobilinogen, UA: 0.2 mg/dL (ref 0.0–1.0)
pH: 6 (ref 5.0–8.0)

## 2011-10-05 LAB — URINE MICROSCOPIC-ADD ON

## 2011-10-05 LAB — CBC
HCT: 35.9 % — ABNORMAL LOW (ref 36.0–46.0)
Hemoglobin: 12 g/dL (ref 12.0–15.0)
MCH: 34.7 pg — ABNORMAL HIGH (ref 26.0–34.0)
MCHC: 33.4 g/dL (ref 30.0–36.0)
MCV: 103.8 fL — ABNORMAL HIGH (ref 78.0–100.0)
Platelets: 143 10*3/uL — ABNORMAL LOW (ref 150–400)
RBC: 3.46 MIL/uL — ABNORMAL LOW (ref 3.87–5.11)
RDW: 14.7 % (ref 11.5–15.5)
WBC: 10.8 10*3/uL — ABNORMAL HIGH (ref 4.0–10.5)

## 2011-10-05 LAB — GLUCOSE, CAPILLARY
Glucose-Capillary: 352 mg/dL — ABNORMAL HIGH (ref 70–99)
Glucose-Capillary: 335 mg/dL — ABNORMAL HIGH (ref 70–99)
Glucose-Capillary: 264 mg/dL — ABNORMAL HIGH (ref 70–99)

## 2011-10-05 LAB — PRO B NATRIURETIC PEPTIDE: Pro B Natriuretic peptide (BNP): 271 pg/mL — ABNORMAL HIGH (ref 0–125)

## 2011-10-05 MED ORDER — CIPROFLOXACIN HCL 250 MG PO TABS: 250.0000 mg | ORAL_TABLET | Freq: Two times a day (BID) | ORAL | Status: AC

## 2011-10-05 MED ORDER — SODIUM CHLORIDE 0.9 % IV BOLUS (SEPSIS)
500.0000 mL | Freq: Once | INTRAVENOUS | Status: AC
  Administered 2011-10-05: 500 mL via INTRAVENOUS

## 2011-10-05 MED ORDER — CIPROFLOXACIN IN D5W 400 MG/200ML IV SOLN
400.0000 mg | Freq: Once | INTRAVENOUS | Status: AC
  Administered 2011-10-05: 400 mg via INTRAVENOUS
  Filled 2011-10-05: qty 200

## 2011-10-05 MED ORDER — INSULIN ASPART 100 UNIT/ML ~~LOC~~ SOLN
6.0000 [IU] | Freq: Once | SUBCUTANEOUS | Status: AC
  Administered 2011-10-05: 6 [IU] via INTRAVENOUS
  Filled 2011-10-05: qty 1

## 2011-10-05 NOTE — ED Provider Notes (Signed)
History     CSN: [id number]  Arrival date & time 10/05/11  [id number]   First MD Initiated Contact with Patient 10/05/11 [phone]      Chief Complaint  Patient presents with  . Hyperglycemia    (Consider location/radiation/quality/duration/timing/severity/associated sxs/prior treatment) The history is provided by the patient.  Patient with past medical history of diabetes, hyperlipidemia, hypertension, CHF, asthma presents from home with generalized weakness. She states that she has felt this way for several weeks; states she feels like this worsens with walking. Feels "fuzzyheaded". This has worsened over the past several days. She additionally has developed dysuria and lower abdominal pain over the past several days. She did have one episode of vomiting earlier this morning. Denies fever, chills. Denies numbness, tingling, headache. She does have a pertinent past medical history of CHF, but denies chest pain or shortness of breath associated with this illness.  Additionally states that her legs are very painful with walking. She has ulcers to her feet and left lower leg.  Past Medical History  Diagnosis Date  . Allergy   . Anxiety   . Depression   . Diabetes mellitus   . GERD (gastroesophageal reflux disease)   . Hyperlipidemia   . Hypertension   . Schizophrenia   . Polypharmacy   . Physical deconditioning   . Barrett esophagus     Barretts Esophagus - EGD 8/07. (Repeat 2-3 years)  . Nephrolithiasis     2008  . Asthma     PFT's (10/14/06)- mild obstructive airway dz, poor effort  . 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 CHF (congestive heart failure)     EF 55% on echo (5/10), repeat ECHO (08/29/09) EF 0000000, Grade 1 Diastolic Dsfxn, Lt atrium mildly dilated, PA systolic pressure mildly elevated.  . Obesity   . PE (pulmonary embolism)     2010  . Gout   . Hypokalemia   . Fibromyalgia   . Vitamin d deficiency   .  Steroid dependence   . Peripheral neuropathy   . Back pain   . Anemia     Past Surgical History  Procedure Date  . Colonoscopy     benign polyps (12/2000), repeat colonoscopy performed in 2007  . Endometrial biopsy     10/03 benign columnar mucosa (limited material)  . Appendectomy   . Cholecystectomy   . Kidney surgery     Family History  Problem Relation Age of Onset  . Bone cancer Mother   . COPD Father     History  Substance Use Topics  . Smoking status: Former Smoker    Quit date: 06/23/1972  . Smokeless tobacco: Never Used  . Alcohol Use: No    OB History    Grav Para Term Preterm Abortions TAB SAB Ect Mult Living                  Review of Systems  Constitutional: Negative for fever, chills, activity change and appetite change.  HENT: Negative for congestion, rhinorrhea, trouble swallowing, neck pain and dental problem.   Eyes: Negative for photophobia.  Respiratory: Negative for cough, chest tightness and shortness of breath.   Cardiovascular: Negative for chest pain and palpitations.  Gastrointestinal: Negative for nausea, vomiting and abdominal pain.  Genitourinary: Positive for dysuria and frequency. Negative for vaginal discharge.  Skin: Negative for color change and rash.  Neurological: Negative for dizziness and weakness.    Allergies  Benztropine mesylate;  Codeine; Diazepam; Diphenhydramine hcl; Lisinopril; Penicillins; Sulfonamide derivatives; and Thioridazine hcl  Home Medications   Current Outpatient Rx  Name Route Sig Dispense Refill  . ALBUTEROL SULFATE (5 MG/ML) 0.5% IN NEBU Nebulization Take 2.5 mg by nebulization every 4 (four) hours as needed. For wheezing or tightness    . ALBUTEROL SULFATE HFA 108 (90 BASE) MCG/ACT IN AERS Inhalation Inhale 1 puff into the lungs every 4 (four) hours.     . ALLOPURINOL 300 MG PO TABS Oral Take 300 mg by mouth 2 (two) times daily.     [name] Kitchen CETIRIZINE HCL 10 MG PO TABS Oral Take 1 tablet (10 mg total) by  mouth daily. 30 tablet 11  . CLONIDINE HCL 0.2 MG PO TABS Oral Take 2 tablets (0.4 mg total) by mouth at bedtime. 60 tablet 1  . DOXEPIN HCL 100 MG PO CAPS Oral Take 100 mg by mouth at bedtime.      [name] Kitchen ESCITALOPRAM OXALATE 10 MG PO TABS Oral Take 1 tablet (10 mg total) by mouth daily. 31 tablet 6  . FERROUS SULFATE 325 (65 FE) MG PO TABS Oral Take 325 mg by mouth 3 (three) times daily.     [name] Kitchen FLUTICASONE-SALMETEROL 500-50 MCG/DOSE IN AEPB Inhalation Inhale 1 puff into the lungs 2 (two) times daily. 60 each 6    Completed faxed request  . FOLIC ACID 1 MG PO TABS Oral Take 1 mg by mouth daily. Take while on methotrexate    . HYDROCHLOROTHIAZIDE 25 MG PO TABS Oral Take 1 tablet (25 mg total) by mouth 2 (two) times daily. 60 tablet 6  . HYDROCODONE-ACETAMINOPHEN 5-325 MG PO TABS  TAKE ONE TABLET EVERY SIX HOURS AS      NEEDED FOR PAIN (UP TO FOUR TIMES       DAILY AS NEEDED FOR PAIN ) 60 tablet 2  . HYDROXYCHLOROQUINE SULFATE 200 MG PO TABS Oral Take 200 mg by mouth 2 (two) times daily.     . INSULIN GLARGINE 100 UNIT/ML [place] SOLN Subcutaneous Inject 20 Units into the skin daily. 20 mL 12  . METFORMIN HCL 500 MG PO TABS Oral Take 1 tablet (500 mg total) by mouth 2 (two) times daily. 60 tablet 6  . METHOTREXATE 2.5 MG PO TABS Oral Take 10 mg by mouth as directed. Per Dr. Estrella Deeds pills on Saturday & on sunday    . MINOCYCLINE HCL 100 MG PO TABS Oral Take 1 tablet (100 mg total) by mouth 2 (two) times daily. 14 tablet 0  . PANTOPRAZOLE SODIUM 40 MG PO TBEC Oral Take 1 tablet (40 mg total) by mouth 2 (two) times daily. 60 tablet 9  . POTASSIUM CHLORIDE CRYS ER 20 MEQ PO TBCR  4 tablets PO in AM, 4 at noon and 4 QHS 360 tablet 3  . PREDNISONE 5 MG PO TABS Oral Take 5 mg by mouth daily.     [name] Kitchen PREGABALIN 100 MG PO CAPS Oral Take 1 capsule (100 mg total) by mouth 3 (three) times daily. 90 capsule 6    Refilled on fax form  . SIMVASTATIN 10 MG PO TABS Oral Take 1 tablet (10 mg total) by mouth daily. 30 tablet  12  . SPIRONOLACTONE 100 MG PO TABS  TAKE ONE TABLET BY MOUTH EVERY MORNING 30 tablet 2    BP 116/71  Pulse 96  Temp(Src) 98.4 F (36.9 C) (Oral)  Resp 25  SpO2 93%  Physical Exam  Nursing note and vitals reviewed. Constitutional: She is  oriented to person, place, and time. She appears well-developed and well-nourished. No distress.       Morbidly obese  HENT:  Head: Normocephalic and atraumatic.  Eyes: Conjunctivae and EOM are normal.  Neck: Normal range of motion.  Cardiovascular: Normal rate, regular rhythm and normal heart sounds.   Pulmonary/Chest: Effort normal and breath sounds normal.       Mild tachypnea  Abdominal: Soft. Bowel sounds are normal.       Mildly tender to palpation in suprapubic area  Musculoskeletal: Normal range of motion.  Neurological: She is alert and oriented to person, place, and time.  Skin: Skin is warm and dry. She is not diaphoretic.       Skin discoloration over lower legs bilaterally. Healing ulcer to left lower leg with no discharge. Healing ulcers to soles of feet which are not draining.  Psychiatric: She has a normal mood and affect.    ED Course  Procedures (including critical care time)   Date: 10/05/2011  Rate: 94  Rhythm: normal sinus rhythm  QRS Axis: normal  Intervals: PR shortened  ST/T Wave abnormalities: normal  Conduction Disutrbances:right bundle branch block and left anterior fascicular block  Narrative Interpretation:   Old EKG Reviewed: as compared with Nov 2012 no significant changes  Labs Reviewed  GLUCOSE, CAPILLARY - Abnormal; Notable for the following:    Glucose-Capillary 352 (*)    All other components within normal limits  PRO B NATRIURETIC PEPTIDE - Abnormal; Notable for the following:    Pro B Natriuretic peptide (BNP) 271.0 (*)    All other components within normal limits  COMPREHENSIVE METABOLIC PANEL - Abnormal; Notable for the following:    Sodium 134 (*)    Potassium 3.3 (*)    Chloride 94 (*)      Glucose, Bld 315 (*)    BUN 37 (*)    Creatinine, Ser 1.19 (*)    Albumin 3.4 (*)    GFR calc non Af Amer 49 (*)    GFR calc Af Amer 57 (*)    All other components within normal limits  CBC - Abnormal; Notable for the following:    WBC 10.8 (*)    RBC 3.46 (*)    HCT 35.9 (*)    MCV 103.8 (*)    MCH 34.7 (*)    Platelets 143 (*)    All other components within normal limits  DIFFERENTIAL - Abnormal; Notable for the following:    Neutrophils Relative 89 (*)    Lymphocytes Relative 7 (*)    Neutro Abs 9.6 (*)    All other components within normal limits  URINALYSIS, ROUTINE W REFLEX MICROSCOPIC - Abnormal; Notable for the following:    APPearance HAZY (*)    Hgb urine dipstick TRACE (*)    Leukocytes, UA SMALL (*)    All other components within normal limits  URINE MICROSCOPIC-ADD ON - Abnormal; Notable for the following:    Bacteria, UA MANY (*)    All other components within normal limits  GLUCOSE, CAPILLARY - Abnormal; Notable for the following:    Glucose-Capillary 335 (*)    All other components within normal limits  GLUCOSE, CAPILLARY - Abnormal; Notable for the following:    Glucose-Capillary 264 (*)    All other components within normal limits  POCT I-STAT TROPONIN I  URINE CULTURE   Dg Chest Port 1 View  10/05/2011  *RADIOLOGY REPORT*  Clinical Data: Weakness, confusion  PORTABLE CHEST - 1 VIEW  Comparison: Chest x-ray of 06/30/2011  Findings: Linear scarring or atelectasis is noted at the left lung base.  No active infiltrate or effusion is seen.  Mediastinal contours are stable.  The heart is mildly enlarged and stable.  No bony abnormality is seen.  IMPRESSION: No active lung disease.  Mild cardiomegaly.  Original Report Authenticated By: Joretta Bachelor, M.D.     1. Weakness   2. UTI (lower urinary tract infection)       MDM  10:25 AM Patient assessed. Plan to obtain labs including chest x-ray, urine, BNP, CBC and electrolytes. Will monitor  closely.  1:51 PM Patient's labs show a possible UTI - urine sent for cx. She continues to feel weak. Plan to consult FP.  2:08 PM Discussed with the family practice resident. They will see in the ED and make recommendations.  4:14 PM The residents have seen the pt and discussed options with her. Patient was agreeable to dc home with close f/u in clinic. She was given rx for abx. Return precautions discussed.     Abran Richard, [place] 10/05/11 [place], [place] 10/05/11 [phone]

## 2011-10-05 NOTE — ED Notes (Signed)
To ED from home via EMS, pt with increased weakness and difficulty urinating since yesterday, also reports edema to LLE, CBG 364 on EMS arrival, 316 pta in ED, 120/64, pulse 110, 18g LAC,

## 2011-10-05 NOTE — H&P (Cosign Needed)
[facility] Admission History and Physical  Patient name: [name] Medical record number: [id number] Date of birth: 14-Jun-1951 Age: 61 y.o. Gender: female  Primary Care Provider: Lynne Leader, MD, MD  Chief Complaint: pian on her legs History of Present Illness: [name] is a 61 y.o. female presenting with pain on her lower extremities on the area of her venous stasis ulcers that has been increasing lately and today she felt that she did not want to walk due to pain. She also complaints mild nausea with one episode of vomiting last night that has been improving and today she has no nausea, neither had vomited. Her main complaint are her painful legs and the fact that she does not want to overwhelm her niece, who takes care of her.  She also was verbally and emotionally not ready to go to a nursing home facility. Afebrile, no urinary symptoms or dysuria or frequency. Normal bowel movements. Decreased oral intake lately but not reported weight loss. No chest pain, palpitations, shorten ess of breath,  headache, vision problems. No weakness an no other complaints at this time.  Patient Active Problem List  Diagnoses  . DIABETES MELLITUS, TYPE I, UNCONTROLLED  . DIABETIC PERIPHERAL NEUROPATHY  . HYPERCHOLESTEROLEMIA  . Gout, unspecified  . HYPOKALEMIA  . OBESITY, NOS  . ANEMIA  . SCHIZOPHRENIA  . DEPRESSIVE DISORDER, NOS  . HYPERTENSION, BENIGN SYSTEMIC  . PULMONARY EMBOLISM  . DIASTOLIC Kathleen FAILURE, CHRONIC  . VENOUS INSUFFICIENCY, CHRONIC  . RHINITIS, ALLERGIC  . ASTHMA, PERSISTENT  . Reflux esophagitis  . OSTEOARTHRITIS, MULTI SITES  . BACK PAIN WITH RADICULOPATHY  . SLEEP APNEA  . INCONTINENCE, URGE  . Polypharmacy  . Diabetic foot ulcer  . Physical deconditioning  . Weakness   Past Medical History: Past Medical History  Diagnosis Date  . Allergy   . Anxiety   . Depression   . Diabetes mellitus   . GERD (gastroesophageal reflux  disease)   . Hyperlipidemia   . Hypertension   . Schizophrenia   . Polypharmacy   . Physical deconditioning   . Barrett esophagus     Barretts Esophagus - EGD 8/07. (Repeat 2-3 years)  . Nephrolithiasis     2008  . Asthma     PFT's (10/14/06)- mild obstructive airway dz, poor effort  . 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 CHF (congestive Kathleen failure)     EF 55% on echo (5/10), repeat ECHO (08/29/09) EF 0000000, Grade 1 Diastolic Dsfxn, Lt atrium mildly dilated, PA systolic pressure mildly elevated.  . Obesity   . PE (pulmonary embolism)     2010  . Gout   . Hypokalemia   . Fibromyalgia   . Vitamin d deficiency   . Steroid dependence   . Peripheral neuropathy   . Back pain   . Anemia    Past Surgical History: Past Surgical History  Procedure Date  . Colonoscopy     benign polyps (12/2000), repeat colonoscopy performed in 2007  . Endometrial biopsy     10/03 benign columnar mucosa (limited material)  . Appendectomy   . Cholecystectomy   . Kidney surgery    Social History: History   Social History  . Marital Status: Single    Spouse Name: N/A    Number of Children: N/A  . Years of Education: N/A   Social History Main Topics  . 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 Smokeless tobacco: Never Used  . Alcohol Use: No  . Drug Use: No  . Sexually Active: No   Other Topics Concern  . None   Social History Narrative  . None   Family History: Family History  Problem Relation Age of Onset  . Bone cancer Mother   . COPD Father    Allergies: Allergies  Allergen Reactions  . Benztropine Mesylate     REACTION: Alopecia and white spots on eyes  . Codeine     REACTION: Mouth Swelling - Tachycardia  . Diazepam     REACTION: COMA  . Diphenhydramine Hcl     REACTION: Tachycardia , anxiousness  . Lisinopril     REACTION: cough  . Penicillins     REACTION: ulcers in mouth,  swelling, throat swelling  . Sulfonamide Derivatives     REACTION: rash - blisters  . Thioridazine Hcl Other (See Comments)    REACTION: swelling   Current Facility-Administered Medications  Medication Dose Route Frequency Last Dose  . ciprofloxacin (CIPRO) IVPB 400 mg  400 mg iv Once 400 mg at 10/05/11 1139  . insulin aspart (novoLOG) injection 6 Units  6 Units iv Once 6 Units at 10/05/11 1140  . sodium chloride 0.9 % bolus 500 mL  500 mL iv Once 500 mL at 10/05/11 1140   Medication  . albuterol (PROVENTIL HFA;VENTOLIN HFA) 108 (90 BASE) MCG/ACT inhaler  . albuterol (PROVENTIL) (5 MG/ML) 0.5% nebulizer solution  . alendronate (FOSAMAX) 70 MG tablet  . allopurinol (ZYLOPRIM) 300 MG tablet  . cetirizine (ZYRTEC) 10 MG tablet  . Cholecalciferol 1000 UNITS tablet  . cloNIDine (CATAPRES) 0.2 MG tablet  . doxepin (SINEQUAN) 100 MG capsule  . doxepin (SINEQUAN) 25 MG capsule  . EPINEPHrine (EPI-PEN) 0.3 mg/0.3 mL DEVI  . escitalopram (LEXAPRO) 10 MG tablet  . esomeprazole (NEXIUM) 40 MG capsule  . ferrous sulfate 325 (65 FE) MG tablet  . fluticasone (FLONASE) 50 MCG/ACT nasal spray  . Fluticasone-Salmeterol (ADVAIR) 500-50 MCG/DOSE AEPB  . folic acid (FOLVITE) 1 MG tablet  . furosemide (LASIX) 80 MG tablet  . hydrochlorothiazide (HYDRODIURIL) 25 MG tablet  . HYDROcodone-acetaminophen (NORCO) 5-325 MG per tablet  . hydroxychloroquine (PLAQUENIL) 200 MG tablet  . insulin aspart (NOVOLOG) 100 UNIT/ML injection  . metFORMIN (GLUCOPHAGE) 500 MG tablet  . methotrexate 2.5 MG tablet  . oxybutynin (DITROPAN) 5 MG tablet  . potassium chloride SA (K-DUR,KLOR-CON) 20 MEQ tablet  . predniSONE (DELTASONE) 5 MG tablet  . pregabalin (LYRICA) 100 MG capsule  . ranitidine (ZANTAC) 300 MG capsule  . simvastatin (ZOCOR) 10 MG tablet  . spironolactone (ALDACTONE) 100 MG tablet  . insulin glargine (LANTUS) 100 UNIT/ML injection    Review Of Systems: Per HPI otherwise unremarkable.  Physical  Exam: Filed Vitals:   10/05/11 1547  BP: 101/63  Pulse: 92  Temp:   Resp: 21   Gen:  NAD HEENT: Moist mucous membranes CV: Regular rate and rhythm, no murmurs rubs or gallops PULM: Clear to auscultation bilaterally. No wheezes/rales/rhonchi ABD: Soft, non tender, non distended, normal bowel sound. No CVA tenderness. EXT: mild edema and erythema on both lower extremities corresponding with her baseline of venus stasis. Three 1 cm to 0.5 cm ulcers on left LE. Neuro: Alert and oriented x3. No focalization.  Labs and Imaging: Lab Results  Component Value Date/Time   NA 134* 10/05/2011  9:52 AM   K 3.3* 10/05/2011  9:52 AM   CL 94* 10/05/2011  9:52 AM   CO2 28 10/05/2011  9:52 AM   BUN 37* 10/05/2011  9:52 AM   CREATININE 1.19* 10/05/2011  9:52 AM   GLUCOSE 315* 10/05/2011  9:52 AM   Lab Results  Component Value Date   WBC 10.8* 10/05/2011   HGB 12.0 10/05/2011   HCT 35.9* 10/05/2011   MCV 103.8* 10/05/2011   PLT 143* 10/05/2011    Assessment and Plan: [name] is a 60 y.o.  female presenting with lower extremity pain.  1. Venous Stasis: Pt is afebrile her lower extremities are at her baseline. Pt does not know what she takes for pain since her niece is who administer her meds and she is at work.  Normal WBC and vitals. Plan: pt at home on [place]. Resume treatment and evaluate as outpatient compliance and administration by her care provider. 2. Electrolytes disbalances: Borderline Pt was not taking good PO, had 1 vomit, was given 500 ml on ED of NS clinically improved. She is not nauseated and has appetite. We think that this mild abnormality of  labs will return to normal with good PO intake. Recommend repeat as outpatient. No changes on EKG.  3. UA low yield for UTI: was treated with  1 dose Cipro on ED. We don't consider continuing antibiotic treatment at this time. If pt has new onset of symptoms we recommend reevaluation. 4. Her other medical conditions were stable during her  time on ED. 5. Disposition: discharge home and close f/u with PCP.  [place] PGY-1 FMTS Pager [phone]

## 2011-10-05 NOTE — Discharge Instructions (Signed)
Please call the family practice clinic tomorrow to make a followup appointment. Your lab work today was relatively normal. It did show a possible UTI, and you have been given a prescription of antibiotics to treat this. You may take your home pain medication as needed for your leg pain. If you're having worsening weakness, pain, or any other worrisome symptoms, please return to the ER.  Urinary Tract Infection Infections of the urinary tract can start in several places. A bladder infection (cystitis), a kidney infection (pyelonephritis), and a prostate infection (prostatitis) are different types of urinary tract infections (UTIs). They usually get better if treated with medicines (antibiotics) that kill germs. Take all the medicine until it is gone. You or your child may feel better in a few days, but TAKE ALL MEDICINE or the infection may not respond and may become more difficult to treat. HOME CARE INSTRUCTIONS   Drink enough water and fluids to keep the urine clear or pale yellow. Cranberry juice is especially recommended, in addition to large amounts of water.   Avoid caffeine, tea, and carbonated beverages. They tend to irritate the bladder.   Alcohol may irritate the prostate.   Only take over-the-counter or prescription medicines for pain, discomfort, or fever as directed by your caregiver.  To prevent further infections:  Empty the bladder often. Avoid holding urine for long periods of time.   After a bowel movement, women should cleanse from front to back. Use each tissue only once.   Empty the bladder before and after sexual intercourse.  FINDING OUT THE RESULTS OF YOUR TEST Not all test results are available during your visit. If your or your child's test results are not back during the visit, make an appointment with your caregiver to find out the results. Do not assume everything is normal if you have not heard from your caregiver or the medical facility. It is important for you to  follow up on all test results. SEEK MEDICAL CARE IF:   There is back pain.   Your baby is older than 3 months with a rectal temperature of 100.5 F (38.1 C) or higher for more than 1 day.   Your or your child's problems (symptoms) are no better in 3 days. Return sooner if you or your child is getting worse.  SEEK IMMEDIATE MEDICAL CARE IF:   There is severe back pain or lower abdominal pain.   You or your child develops chills.   You have a fever.   Your baby is older than 3 months with a rectal temperature of 102 F (38.9 C) or higher.   Your baby is 62 months old or younger with a rectal temperature of 100.4 F (38 C) or higher.   There is nausea or vomiting.   There is continued burning or discomfort with urination.  MAKE SURE YOU:   Understand these instructions.   Will watch your condition.   Will get help right away if you are not doing well or get worse.  Document Released: 05/12/2005 Document Revised: 04/14/2011 Document Reviewed: 12/15/2006 [facility] Patient Information 2012 [place].Weakness, Generalized Without Cause Your caregiver has seen you today because you are having problems with feelings of weakness. Weakness has many different causes, some of which are common and others are very rare. The causes of weakness are so numerous they could not all be listed on this page. The exam and other tests done today do not reveal a specific cause for the weakness that is an immediate  danger or something that is treatable. Your caregiver has checked you for the most common causes of weakness and feels it is safe for you to go home and be observed. HOME CARE INSTRUCTIONS   For the time being, obtain more rest if needed.   Eat a well balanced diet.   Try to get at least some exercise every day in spite of how difficult it may seem at times. In the case of the elderly, exercise is especially important. As we grow older, there is a loss of muscle mass. Generally, there  is also a loss of, or decrease in, activity that comes naturally with the aging process. Exercise and increased activities are the only tools we have to combat this natural process.   The results of some tests ordered today may not be available right away. You will be contacted with those results when they become available.   It is important to follow through with your physician as per instructions that you may have received today.  SEEK MEDICAL CARE IF:   You have any new concerns which you do not feel were dealt with today.   The weakness seems to be getting progressively worse.   You develop new or unusual aches or pains.  SEEK IMMEDIATE MEDICAL CARE IF:   You are unable to tend to your usual daily activities such as simply getting dressed, feeding yourself, or keeping up with your personal hygiene.   You develop inability to walk stairs or perform your usual daily activities.   You develop shortness of breath, chest pain, have difficulty moving parts of your body, or develop new problems for which you have not talked to your caregiver.   You experience difficulty speaking or swallowing.   You develop loss of control of bladder or bowels that was not present before.  Document Released: 08/02/2005 Document Revised: 04/14/2011 Document Reviewed: 01/12/2007 [facility] Patient Information 2012 [place], [place].

## 2011-10-05 NOTE — ED Notes (Signed)
Pt's CBG was 335 when I checked it. 11:37 am JG.

## 2011-10-06 NOTE — ED Provider Notes (Signed)
Medical screening examination/treatment/procedure(s) were conducted as a shared visit with non-physician practitioner(s) and myself.  I personally evaluated the patient during the encounter  Pt with leg pain, weakness +/- UTI. Given Cipro in ED. Discussed extensively with patient her comfort level for discharge home and she stated to me that she was too weak to return home. She is normally ambulatory but is limited at this time 2/2 her painful LE. She lives with her niece. FM consulted for admission, they did feel that she could successfully be discharged home and they will follow her closely as an outpatient. Patient comfortable with plan for discharge.  Blair Heys, MD 10/06/11 1200

## 2011-10-07 LAB — URINE CULTURE
Colony Count: >=100000
Culture  Setup Time: 201302191210

## 2011-10-08 NOTE — ED Notes (Signed)
Results called to [facility]. She want rx called to [facility] in [place].

## 2011-10-08 NOTE — ED Notes (Addendum)
Stop Cipro; Rx for Macrobid 100 mg for 7 days rx written by Etta Quill.Rx called by Marcie Bal PFM to Pharmacy.

## 2011-10-08 NOTE — ED Notes (Signed)
Prescription for macrobid 100mg  bid x 7 days called to [facility] and told pt to stop cipro as infection is resistant to it; Prescription written by Etta Quill, NP

## 2011-10-08 NOTE — ED Notes (Signed)
Chart sent to [place] office for review

## 2011-10-21 ENCOUNTER — Telehealth: Payer: Self-pay | Admitting: Family Medicine

## 2011-10-21 NOTE — Telephone Encounter (Signed)
Opened in error

## 2011-10-25 IMAGING — CR DG FOOT COMPLETE 3+V*R*
3 series · 3 of 3 positions shown · non-contrast
Comparison: Right foot radiographs performed 11/20/2009

CLINICAL DATA: Chronic pain and swelling at the right foot; history
of diabetes.

RIGHT FOOT COMPLETE - 3+ VIEW

[x foot ap right]
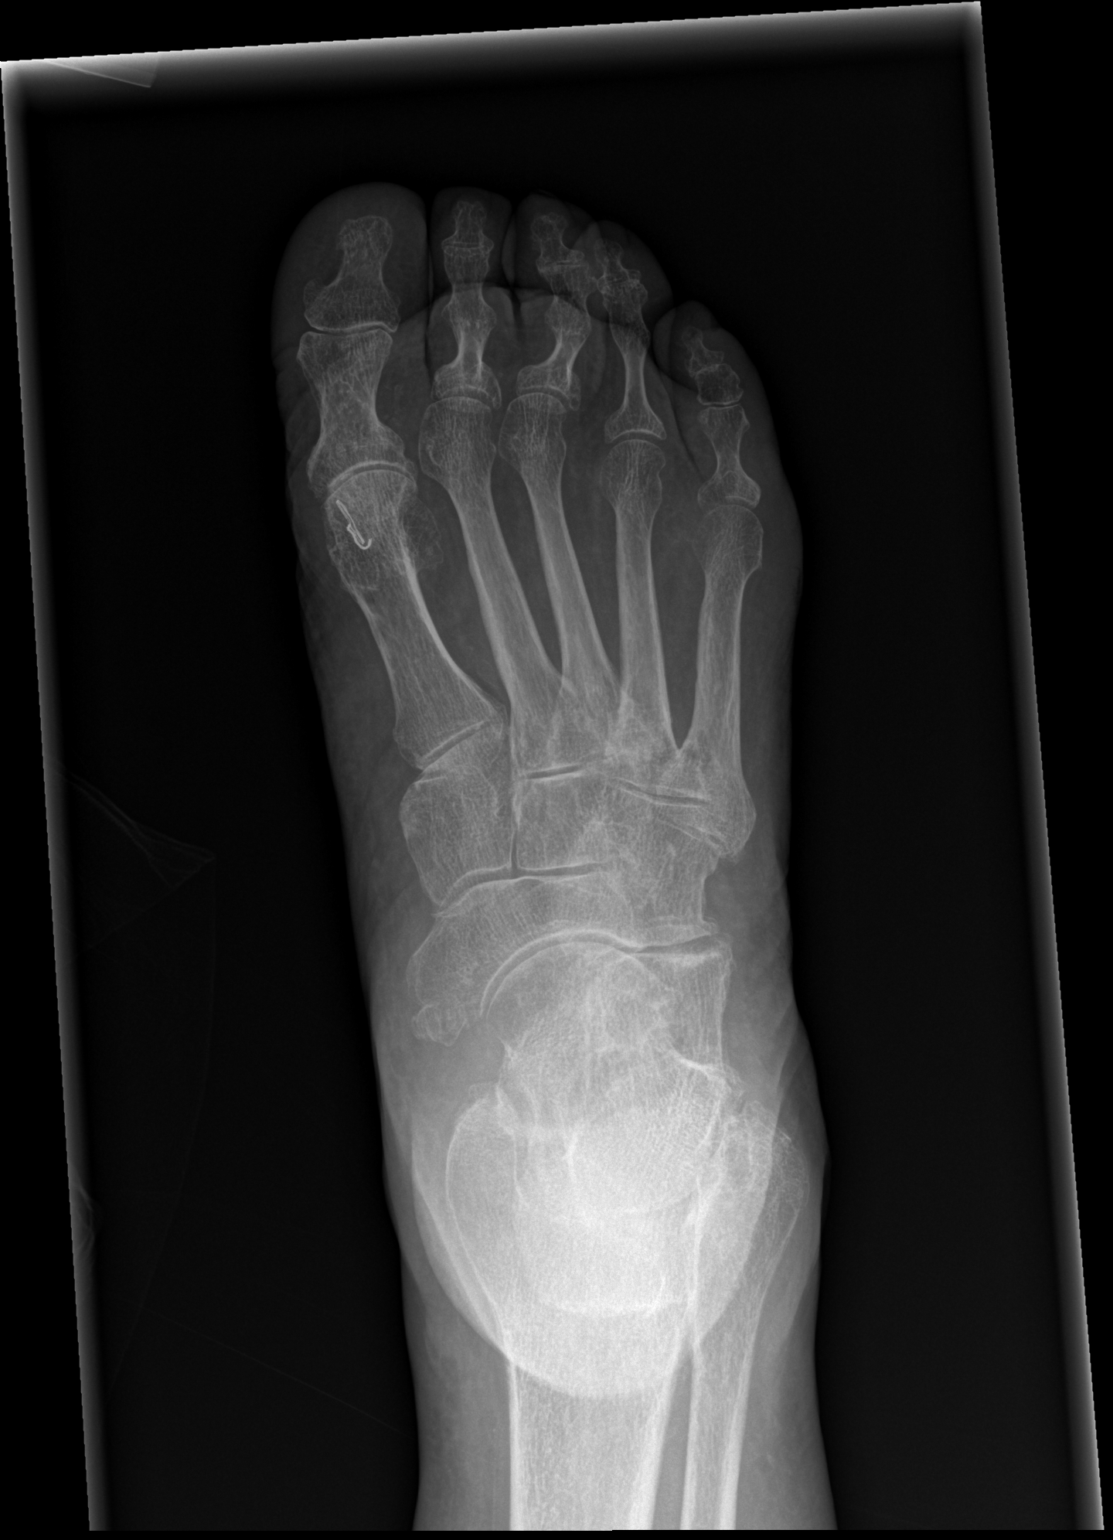

[x foot obl right]
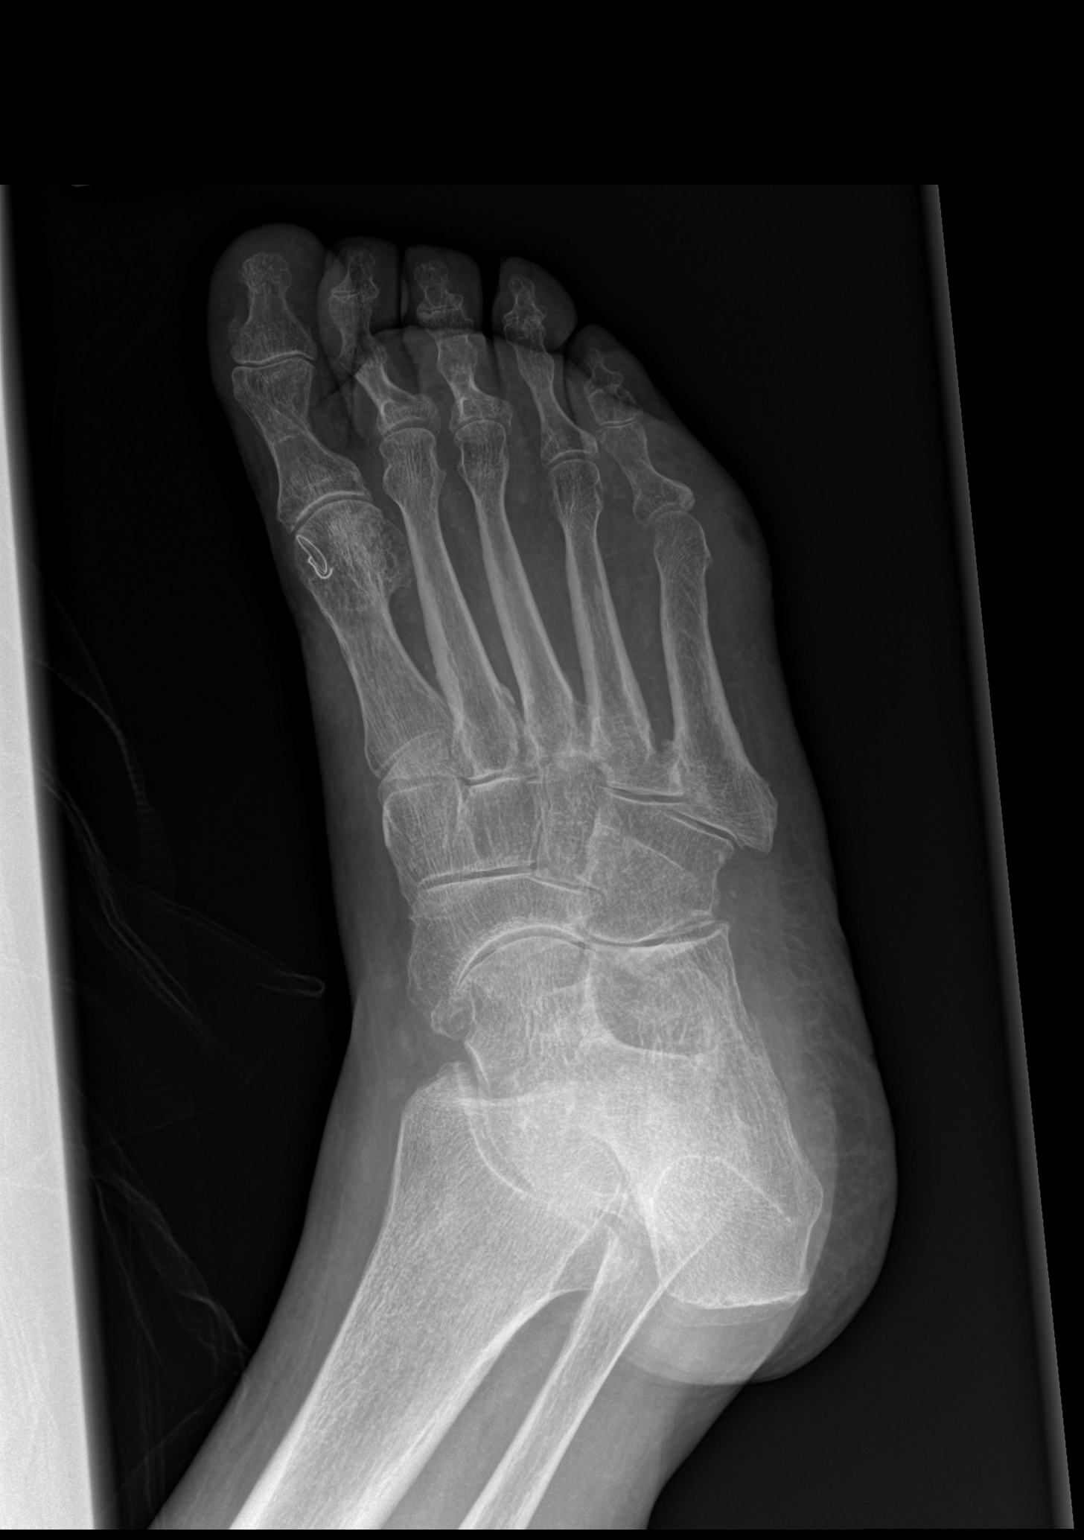

[x foot lat right]
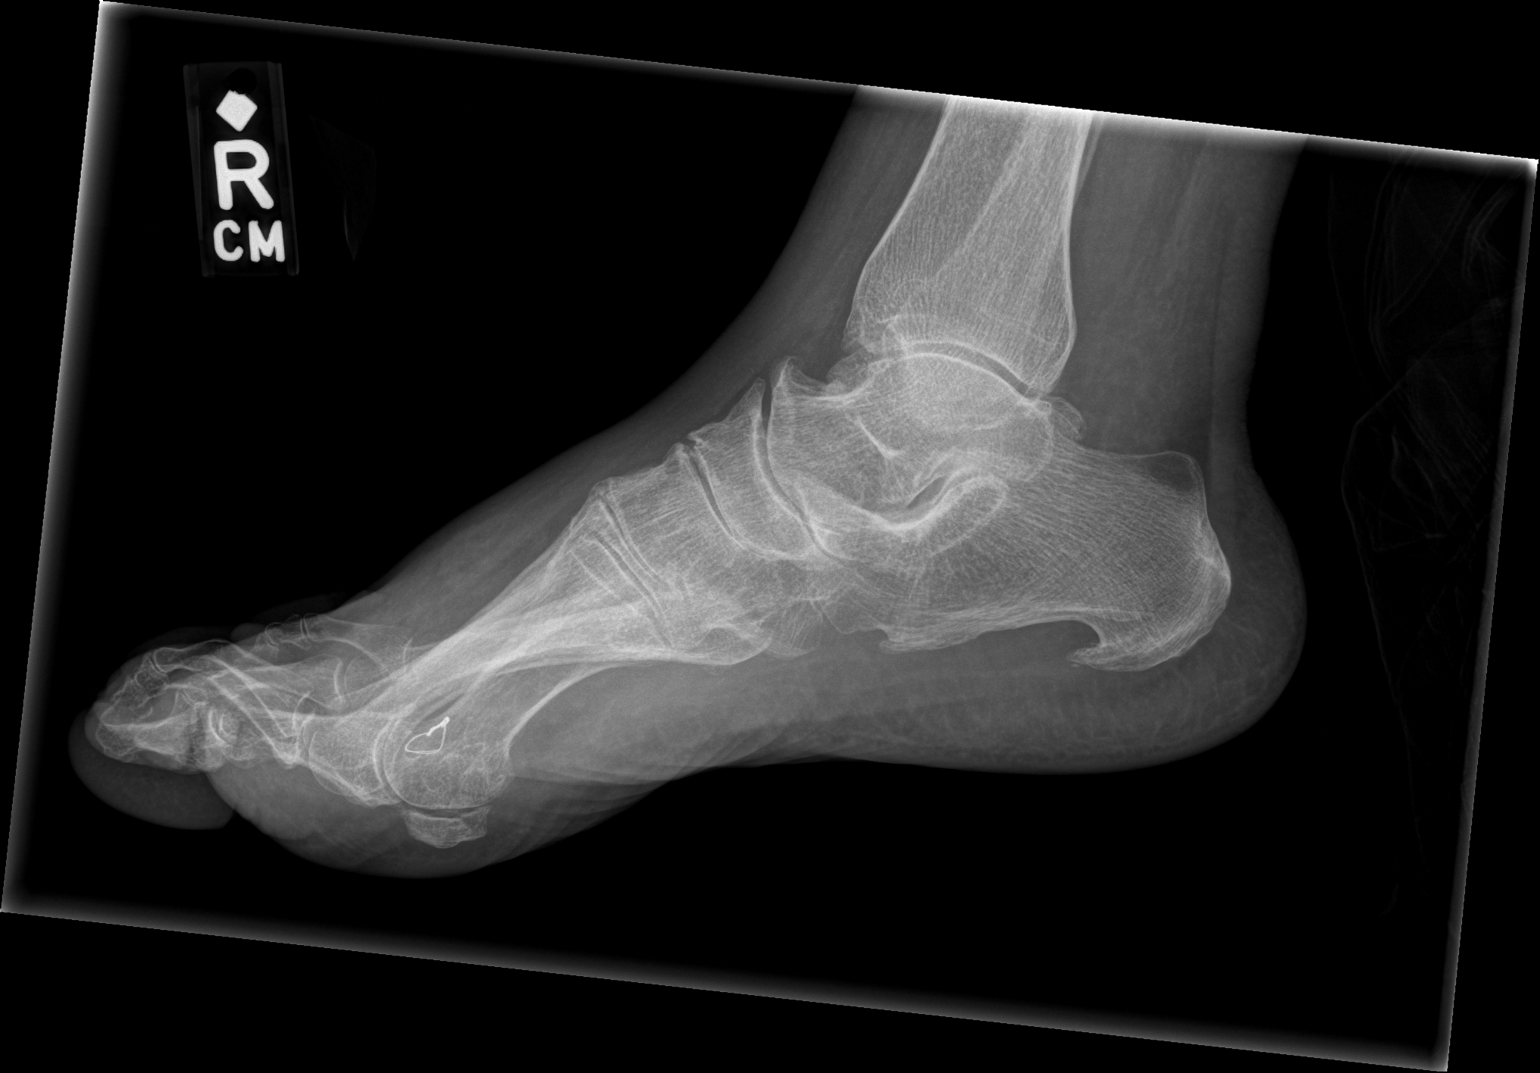

[3 of 3 positions shown; findings below may reference images not displayed]

FINDINGS: There is no evidence of fracture or dislocation.  No
osseous erosions are seen to suggest osteomyelitis.

Postoperative change is noted at the distal first metatarsal.
There is diffuse osteopenia of visualized osseous structures.  The
joint spaces are preserved.  There is no evidence of talar
subluxation; the subtalar joint is unremarkable in appearance.  An
os naviculare is noted.  A plantar calcaneal spur is seen.

Mild diffuse soft tissue swelling is noted, particularly about the
forefoot, with an apparent soft tissue defect overlying the distal
fifth metatarsal.
IMPRESSION: 1.  No evidence of fracture or dislocation; no osseous erosions
seen to suggest osteomyelitis.  Evaluation for osteomyelitis is
suboptimal on radiograph.  If there is significant clinical concern
for osteomyelitis, MRI could be considered for further evaluation.
2.  Mild diffuse soft tissue swelling, particularly about the
forefoot, with apparent soft tissue defect overlying the distal
fifth metatarsal.
3.  Diffuse osteopenia of visualized osseous structures.
4.  Os naviculare noted.

## 2011-11-01 ENCOUNTER — Other Ambulatory Visit: Payer: Self-pay | Admitting: *Deleted

## 2011-11-02 MED ORDER — TRAMADOL-ACETAMINOPHEN 37.5-325 MG PO TABS: 1.0000 | ORAL_TABLET | Freq: Four times a day (QID) | ORAL | Status: AC | PRN

## 2011-11-11 ENCOUNTER — Other Ambulatory Visit: Payer: Self-pay | Admitting: *Deleted

## 2011-11-11 ENCOUNTER — Telehealth: Payer: Self-pay | Admitting: Family Medicine

## 2011-11-11 MED ORDER — BETHANECHOL CHLORIDE 5 MG PO TABS: 5.0000 mg | ORAL_TABLET | Freq: Two times a day (BID) | ORAL | Status: DC

## 2011-11-11 NOTE — Telephone Encounter (Addendum)
Received call from [facility]  in [place], [place]. Patient has recently moved and is having all her meds transferred form [facility] in [place] there. They do not have a current RX for Bethanechol . Paged Dr. Georgina Snell and he entered in Rx and RN called and gave verbally to pharmacist. Notified pharmacy that patient will need appointment  for further refills.

## 2011-11-11 NOTE — Telephone Encounter (Signed)
Pt needs rx for bethanocol called in to new pharmacy. I am providing 3 month supply. Will need repeat visit for more meds.

## 2011-11-23 ENCOUNTER — Other Ambulatory Visit: Payer: Self-pay | Admitting: Family Medicine

## 2011-11-23 MED ORDER — POTASSIUM CHLORIDE CRYS ER 20 MEQ PO TBCR: 80.0000 meq | EXTENDED_RELEASE_TABLET | Freq: Three times a day (TID) | ORAL | Status: DC

## 2011-11-23 MED ORDER — RANITIDINE HCL 300 MG PO CAPS: 300.0000 mg | ORAL_CAPSULE | Freq: Every evening | ORAL | Status: DC

## 2011-11-23 MED ORDER — SPIRONOLACTONE 100 MG PO TABS: 100.0000 mg | ORAL_TABLET | Freq: Every day | ORAL | Status: DC

## 2011-12-10 ENCOUNTER — Telehealth: Payer: Self-pay

## 2011-12-10 IMAGING — CR DG TIBIA/FIBULA 2V*L*
3 series · 3 of 3 positions shown · non-contrast
Comparison: None.

CLINICAL DATA: Fall, ulcers on the left tibia / fibula

LEFT TIBIA AND FIBULA - 2 VIEW

[x tib-fib ap left]
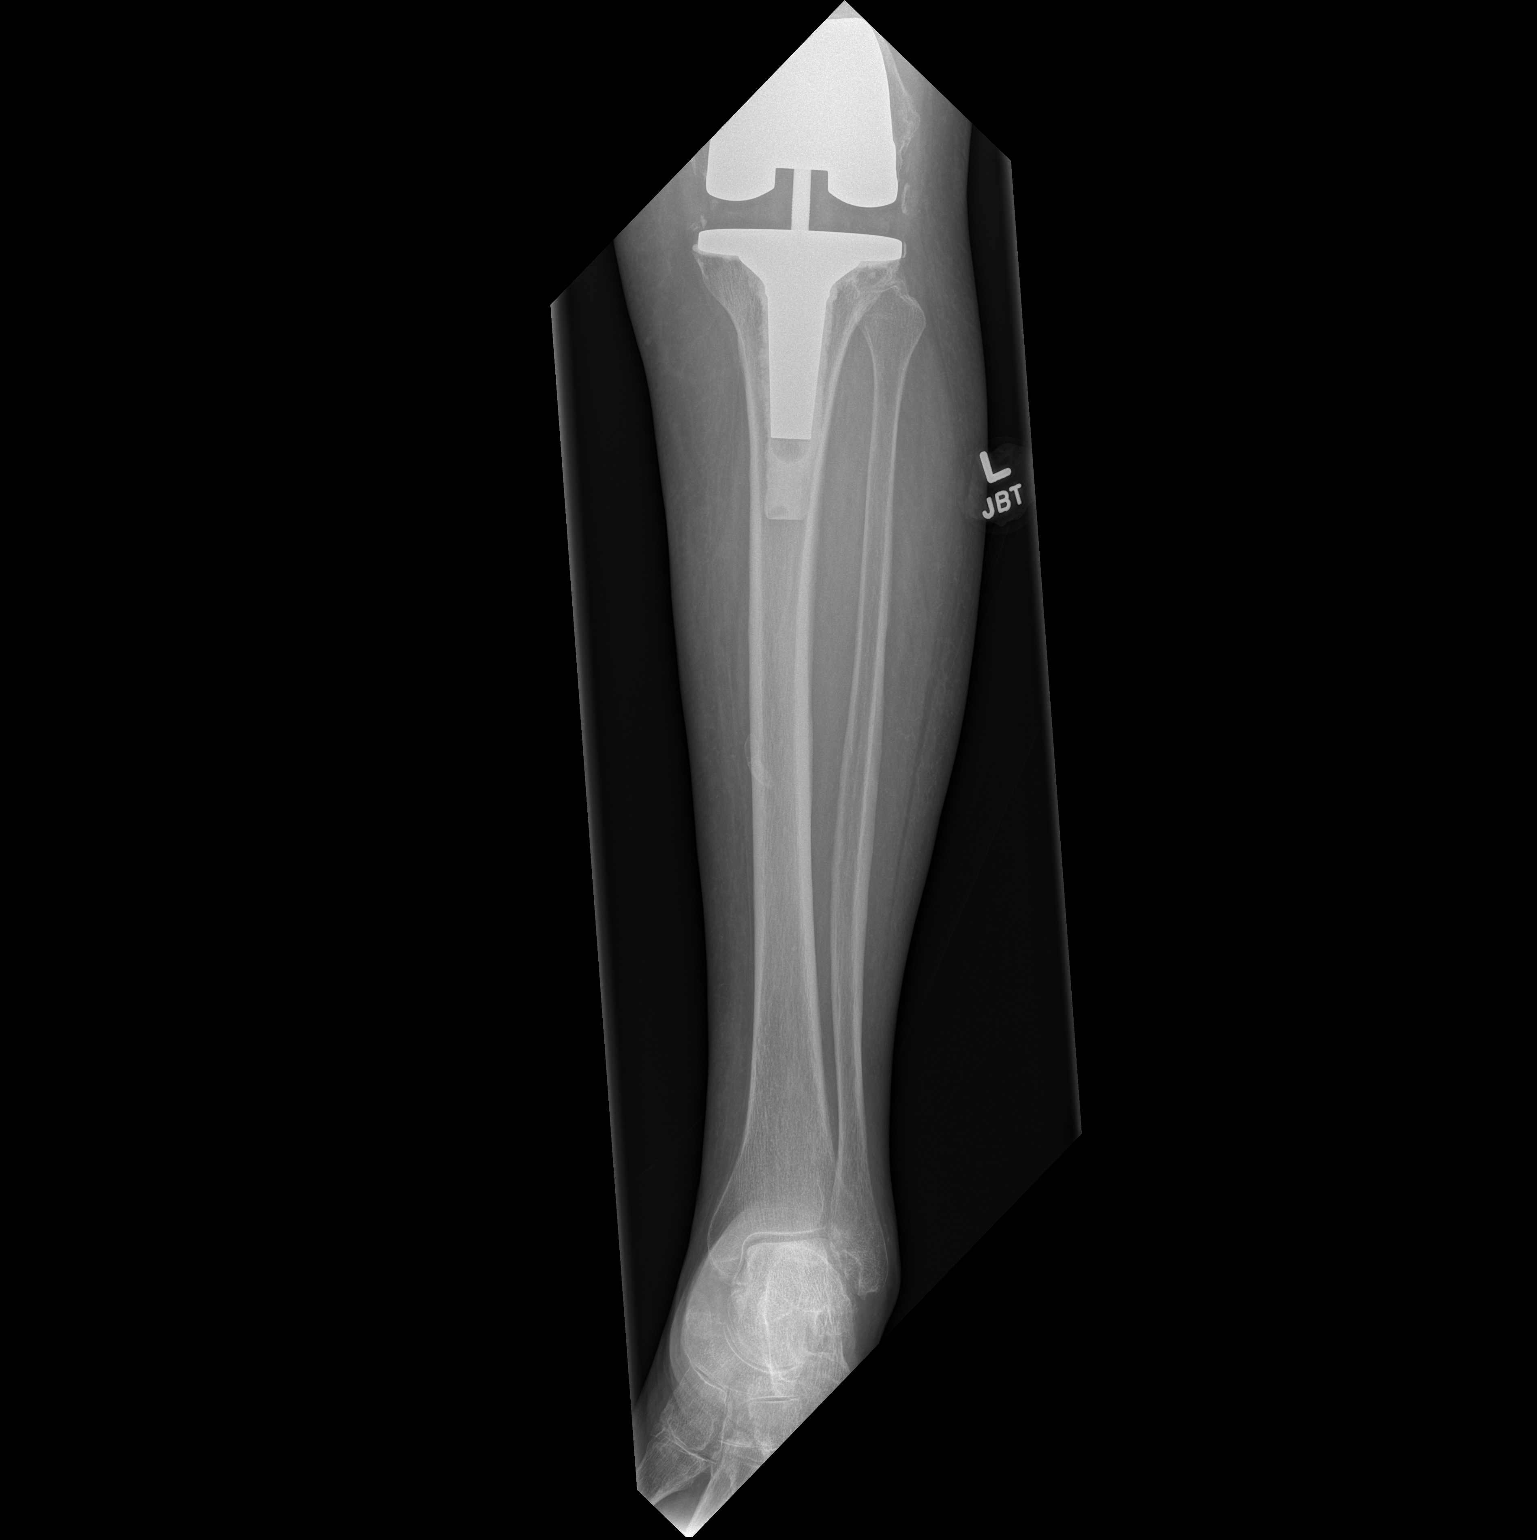

[x tib-fib lat left (1 of 2)]
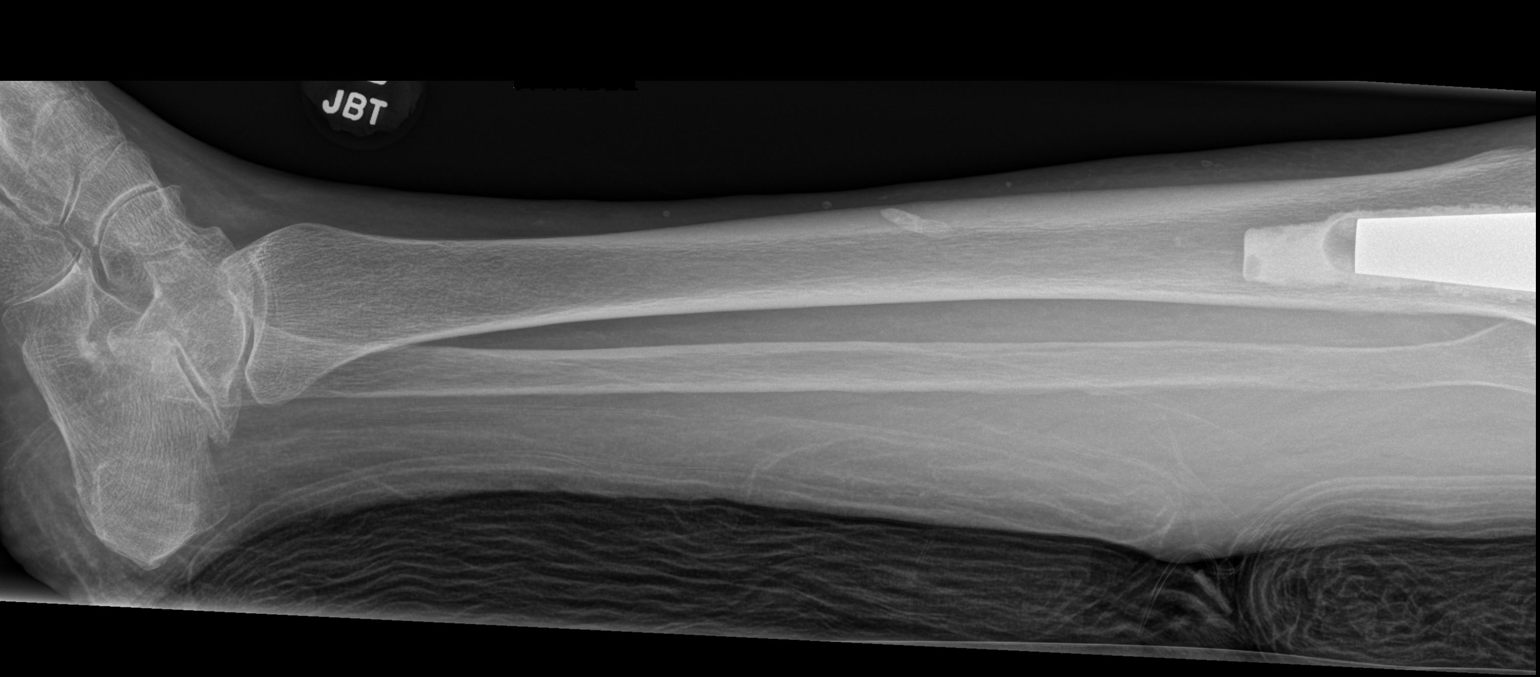

[x tib-fib lat left (2 of 2)]
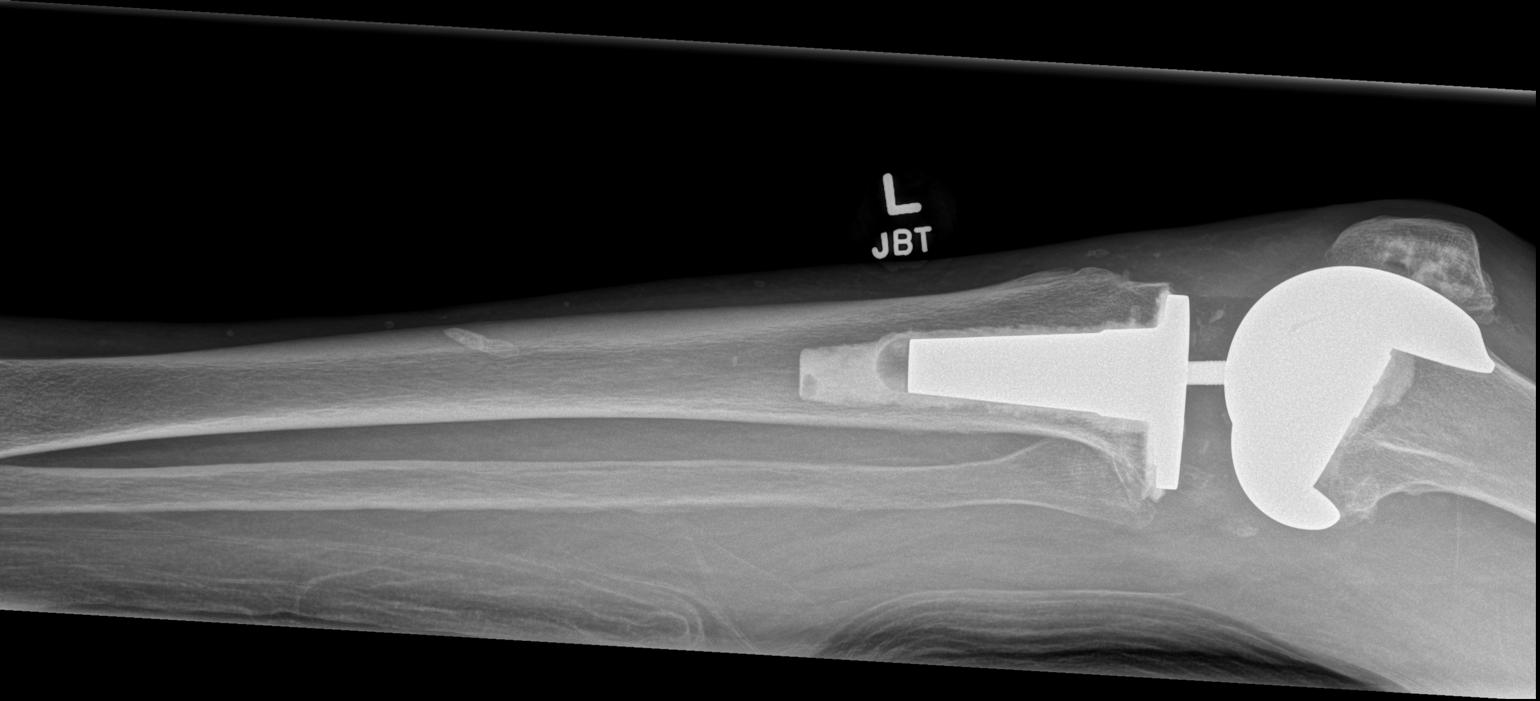

[3 of 3 positions shown; findings below may reference images not displayed]

FINDINGS: Left total knee arthroplasty.

No fracture or dislocation is seen.

Visualized soft tissues are grossly unremarkable.
IMPRESSION: No acute osseous abnormality is seen.

## 2011-12-10 IMAGING — CR DG CHEST 2V
2 series · 2 of 2 positions shown · non-contrast
Comparison: Plain film 10/14/2010

CLINICAL DATA: Fall, leukocytosis

CHEST - 2 VIEW

[w chest lat]
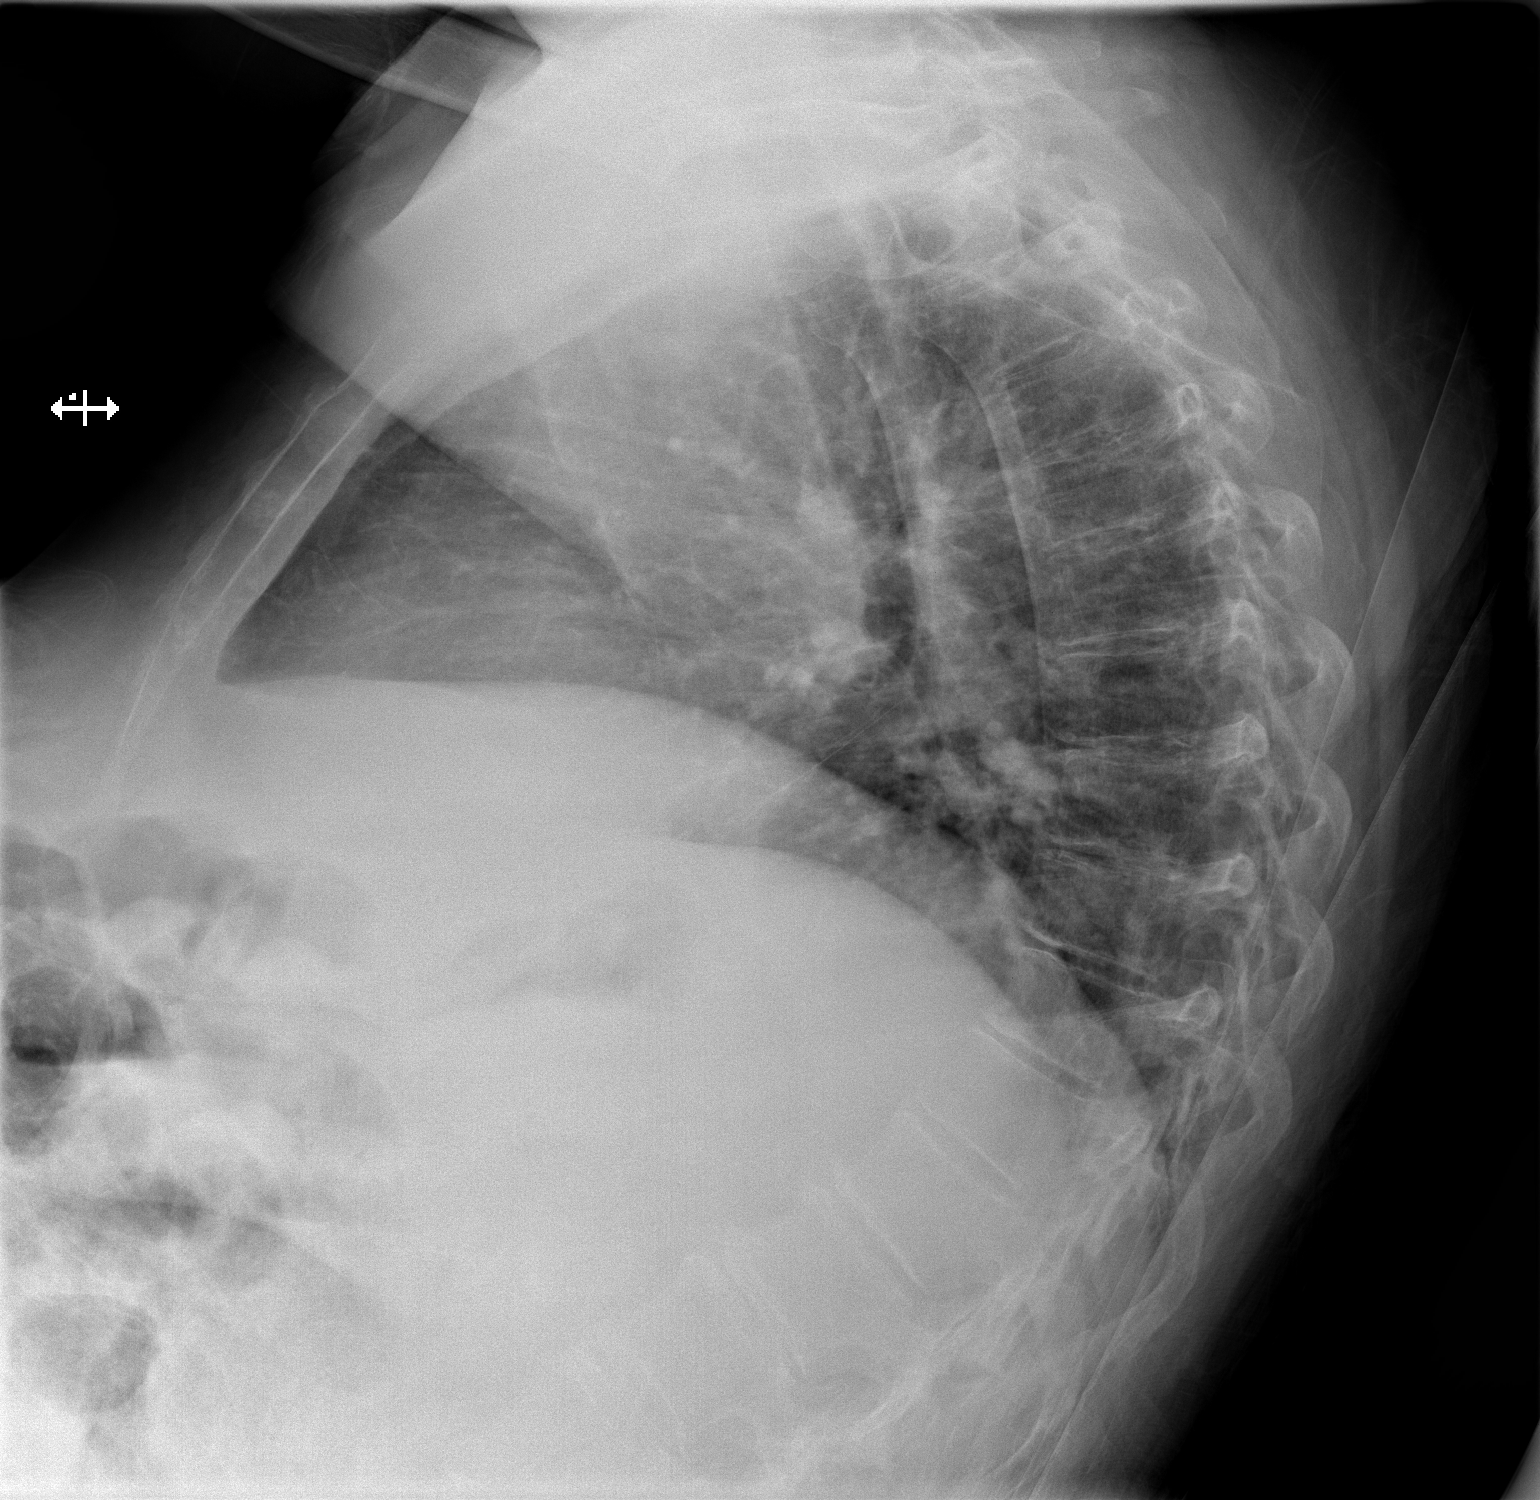

[x chest ap]
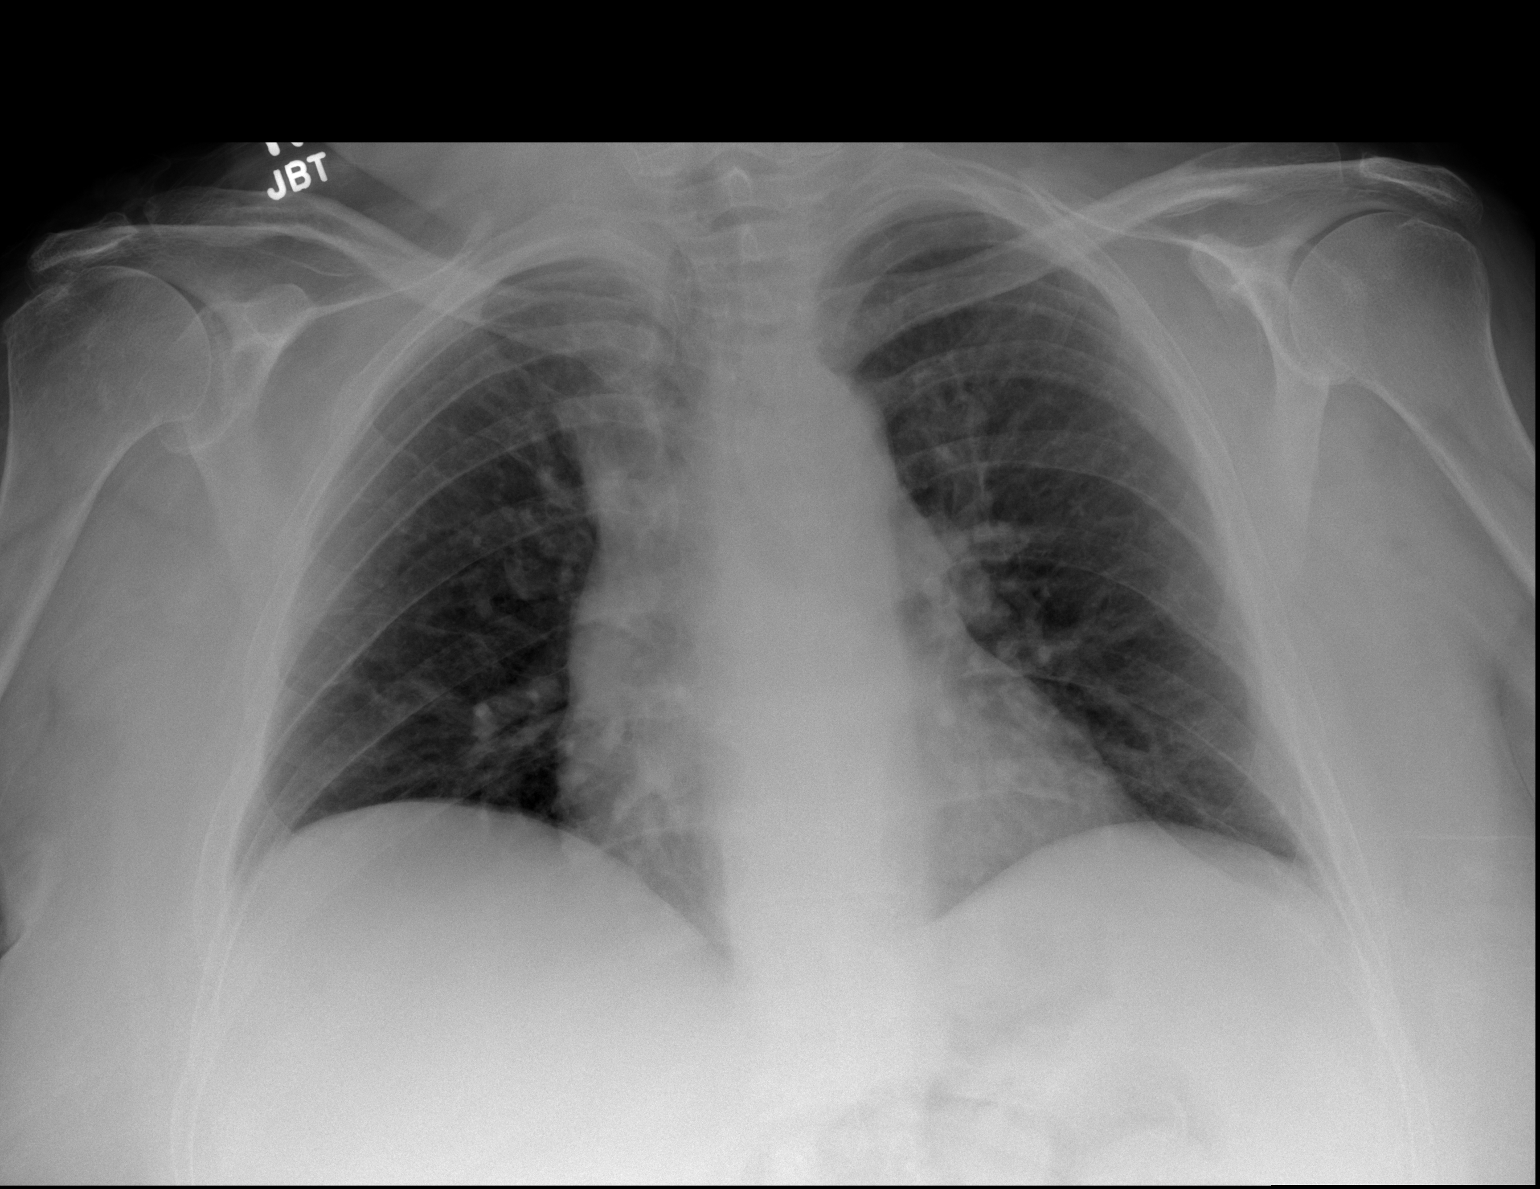

[2 of 2 positions shown; findings below may reference images not displayed]

FINDINGS: Normal cardiac silhouette.  Mild central venous
congestion.  No effusion, infiltrate, or pneumothorax.
IMPRESSION: Mild venous congestion.

## 2011-12-10 IMAGING — CT CT HEAD W/O CM
1 of 2 series · 16 of 30 positions shown, 20 images · non-contrast
Comparison: Head CT 09/28/2010

CLINICAL DATA: Extremity weakness

CT HEAD WITHOUT CONTRAST
TECHNIQUE: Contiguous axial images were obtained from the base of
the skull through the vertex without contrast.

[Series 3: add scan 4.8 h37s · axial · 0.43mm/px · z∈[-92,+34]mm · 16 of 30 slices shown, 20 images]
[im 2/30  brain]
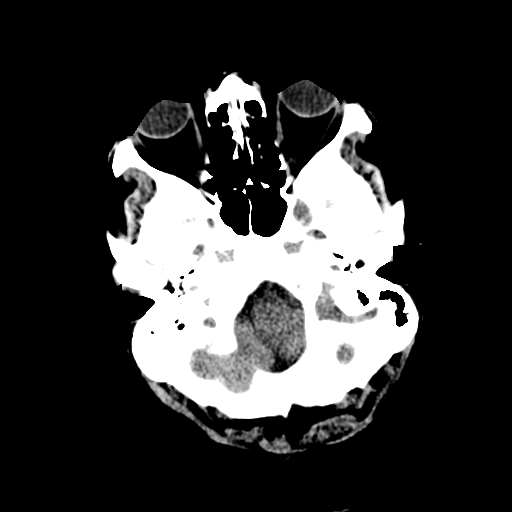
[im 2/30  bone]
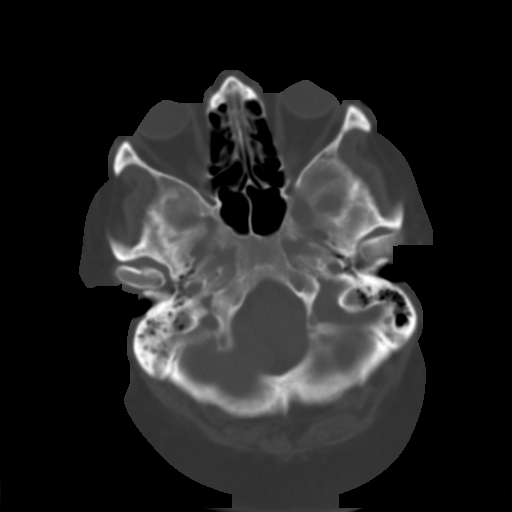
[im 3/30  brain]
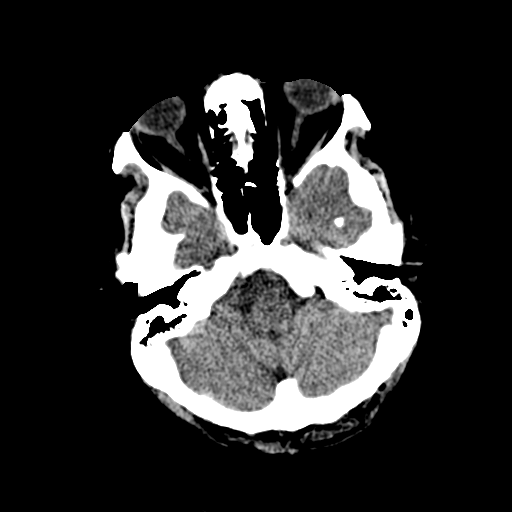
[im 5/30  brain]
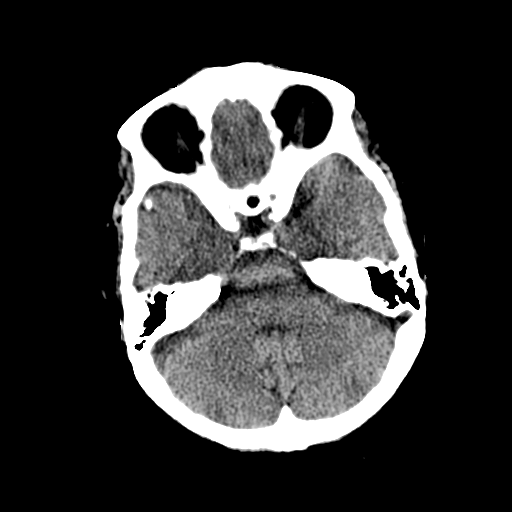
[im 8/30  brain]
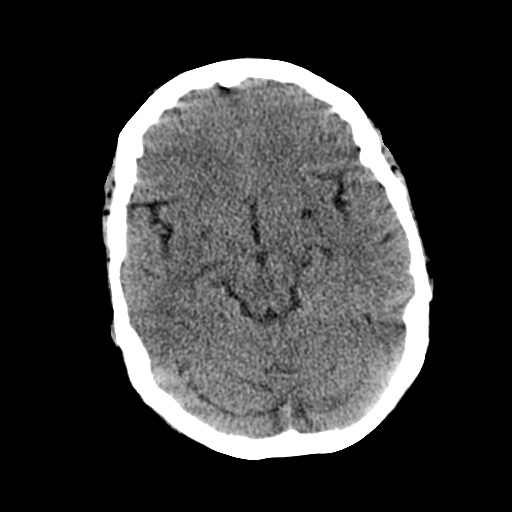
[im 9/30  brain]
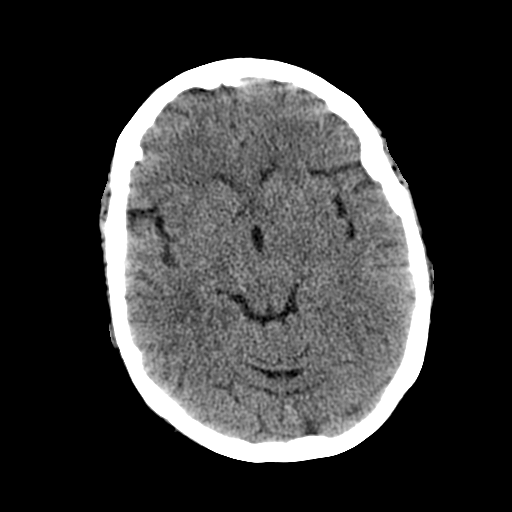
[im 9/30  bone]
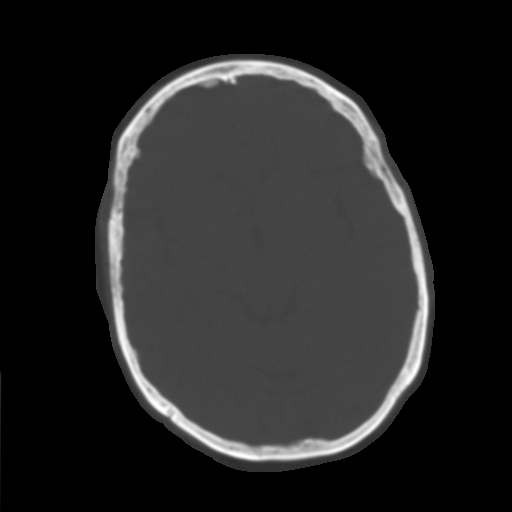
[im 11/30  brain]
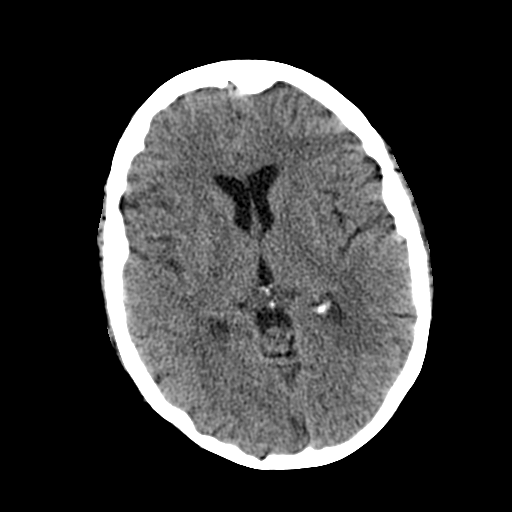
[im 12/30  brain]
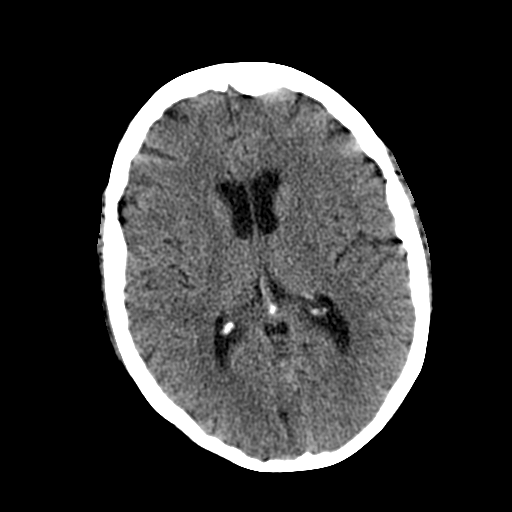
[im 14/30  brain]
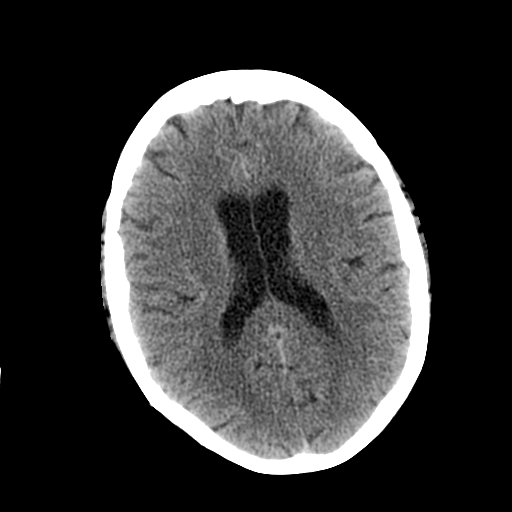
[im 16/30  brain]
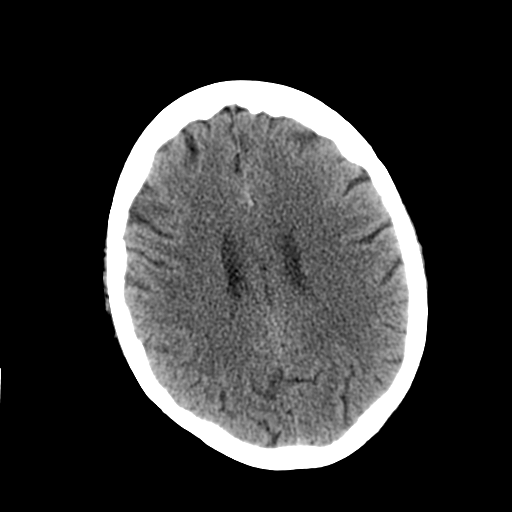
[im 16/30  bone]
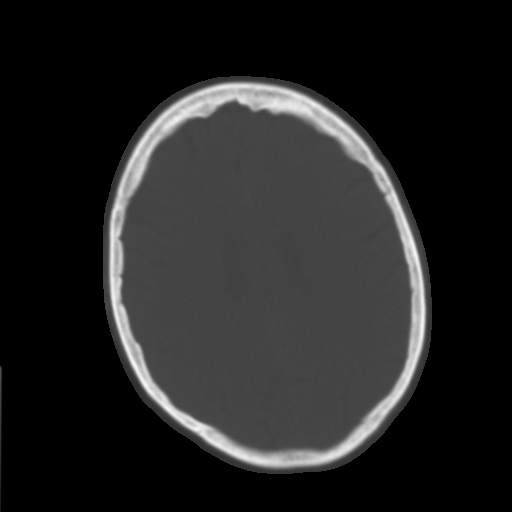
[im 18/30  brain]
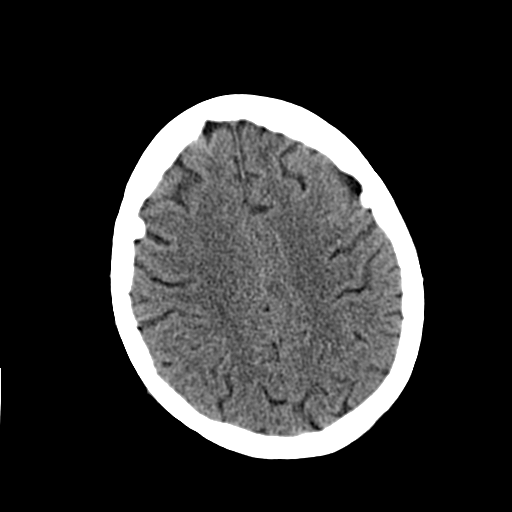
[im 19/30  brain]
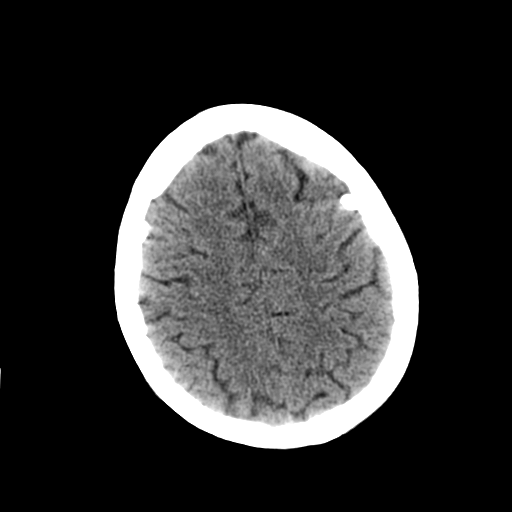
[im 21/30  brain]
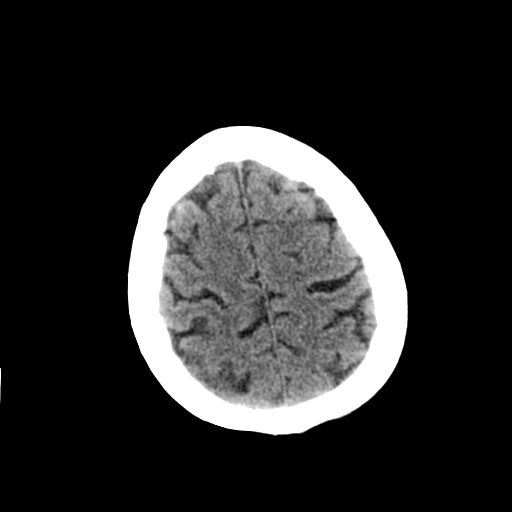
[im 22/30  brain]
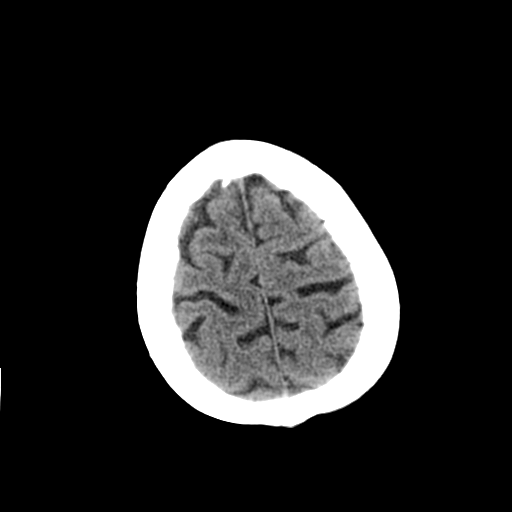
[im 22/30  bone]
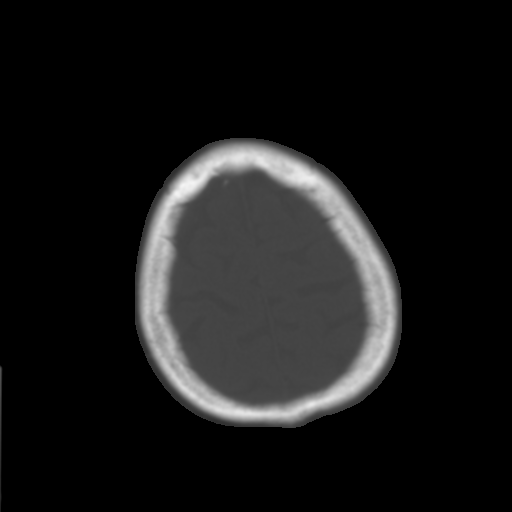
[im 25/30  brain]
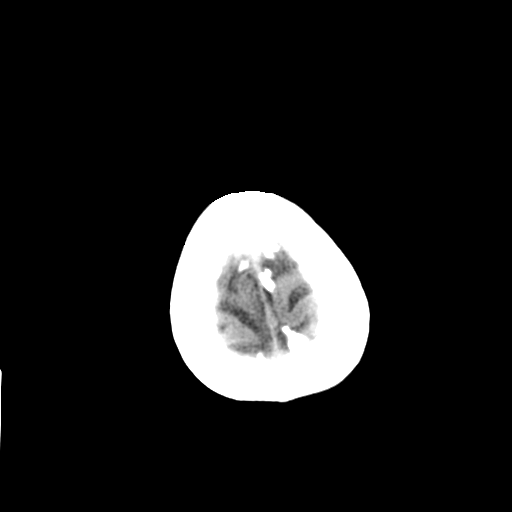
[im 27/30  brain]
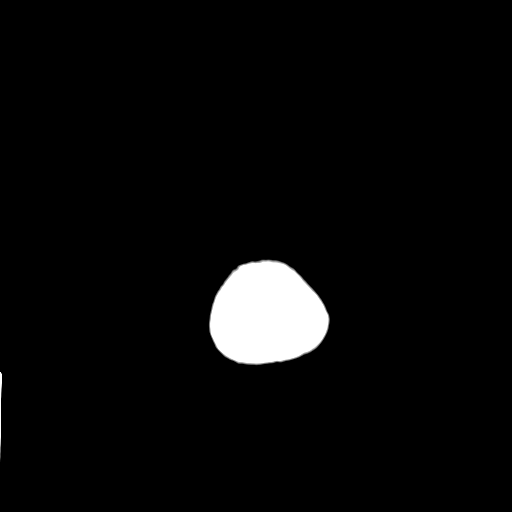
[im 28/30  brain]
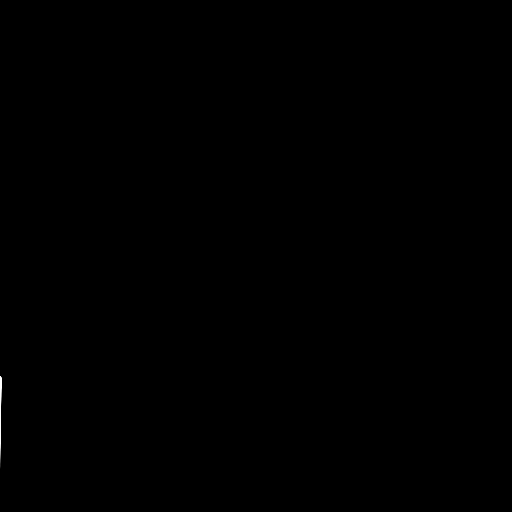

[16 of 30 positions shown; findings below may reference images not displayed]

FINDINGS: No acute intracranial hemorrhage.  No focal mass lesion.
No CT evidence of acute infarction.   No midline shift or mass
effect.  No hydrocephalus.  Basilar cisterns are patent. Paranasal
sinuses and mastoid air cells are clear.  Orbits are normal.
IMPRESSION: No acute intracranial findings.

## 2011-12-10 IMAGING — CR DG KNEE 1-2V*R*
2 series · 2 of 2 positions shown · non-contrast
Comparison: 09/28/2010

CLINICAL DATA: Status post total knee arthroplasty, fall

RIGHT KNEE - 1-2 VIEW

[x knee ap right]
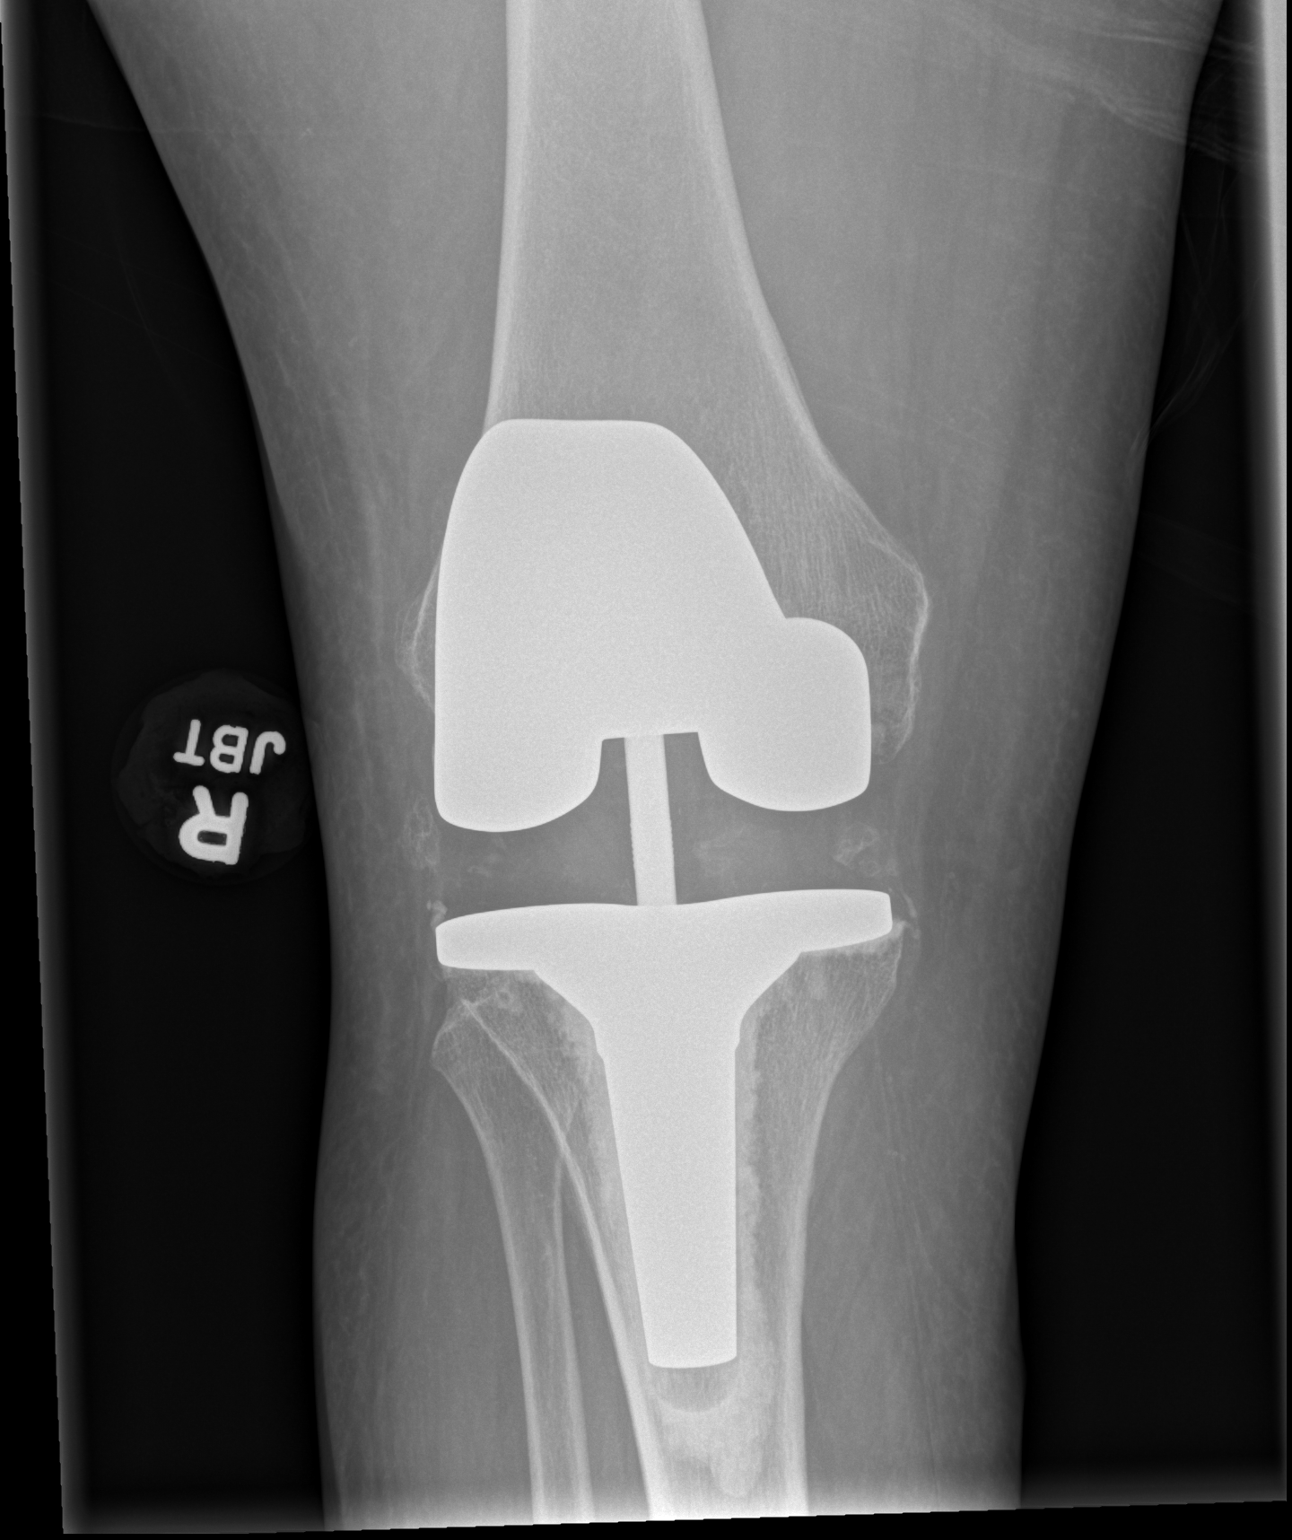

[x knee lat right]
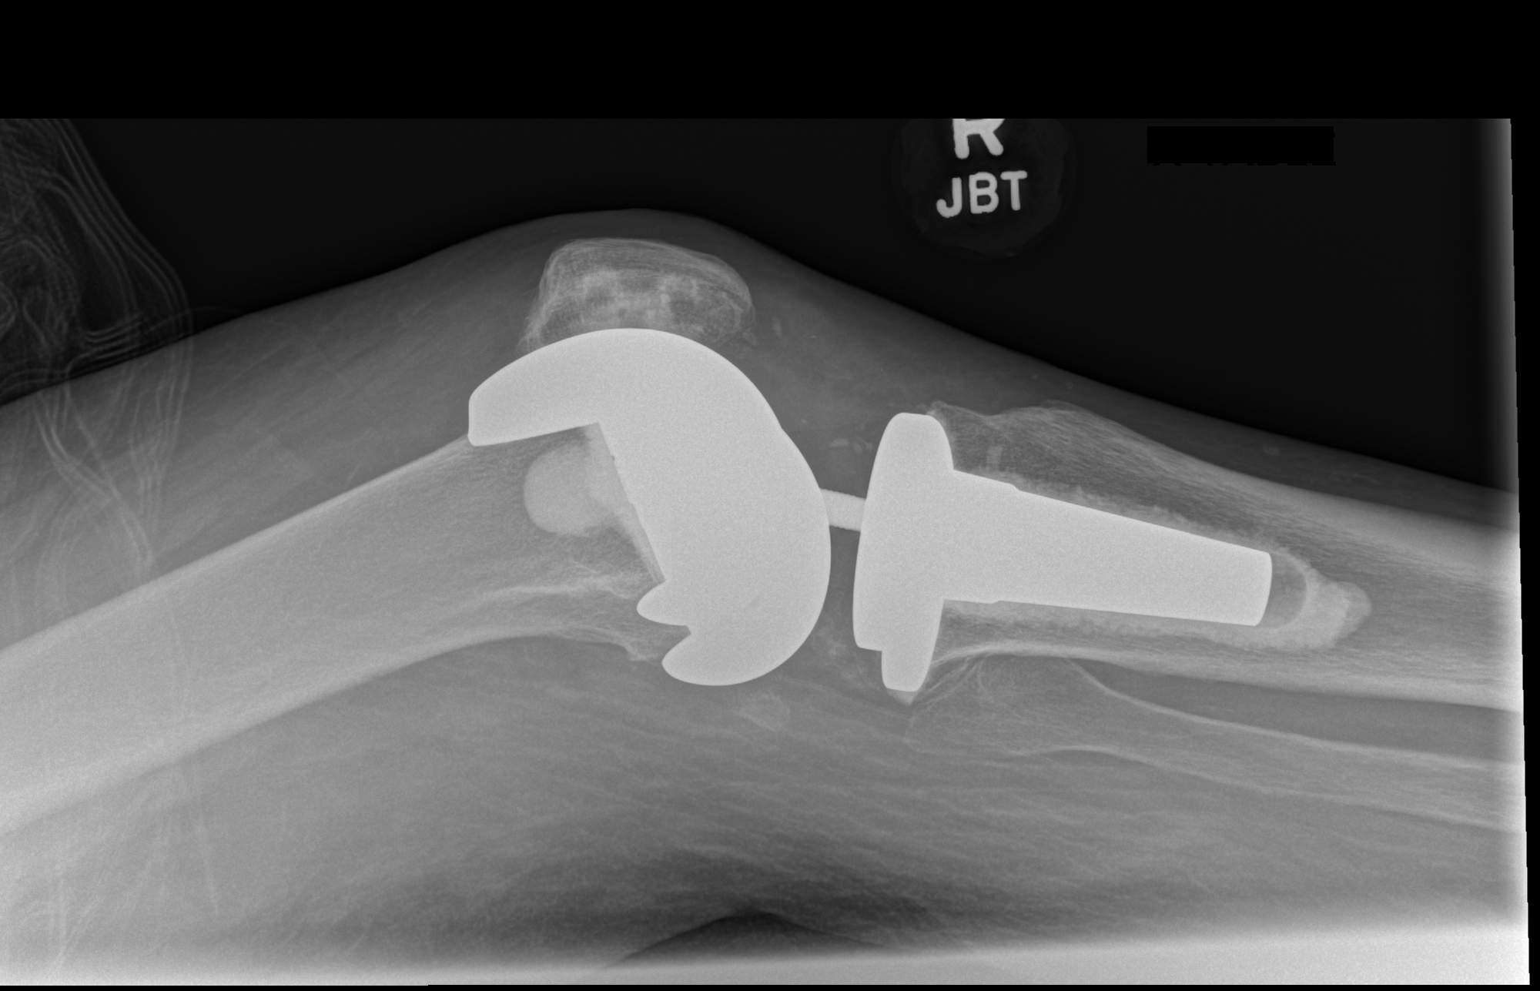

[2 of 2 positions shown; findings below may reference images not displayed]

FINDINGS: Right total knee arthroplasty with spacer.  No evidence
of hardware complication or loosening.

No fracture or dislocation is seen.

The visualized soft tissues are unremarkable.  No suprasellar knee
joint effusion.
IMPRESSION: Right total knee arthroplasty.  No evidence of hardware
complication.

No fracture or dislocation is seen.

## 2011-12-10 IMAGING — CR DG KNEE 1-2V*L*
2 series · 2 of 2 positions shown · non-contrast
Comparison: 09/28/2010

CLINICAL DATA: Total knee arthroplasty, fall

LEFT KNEE - 1-2 VIEW

[x knee ap left]
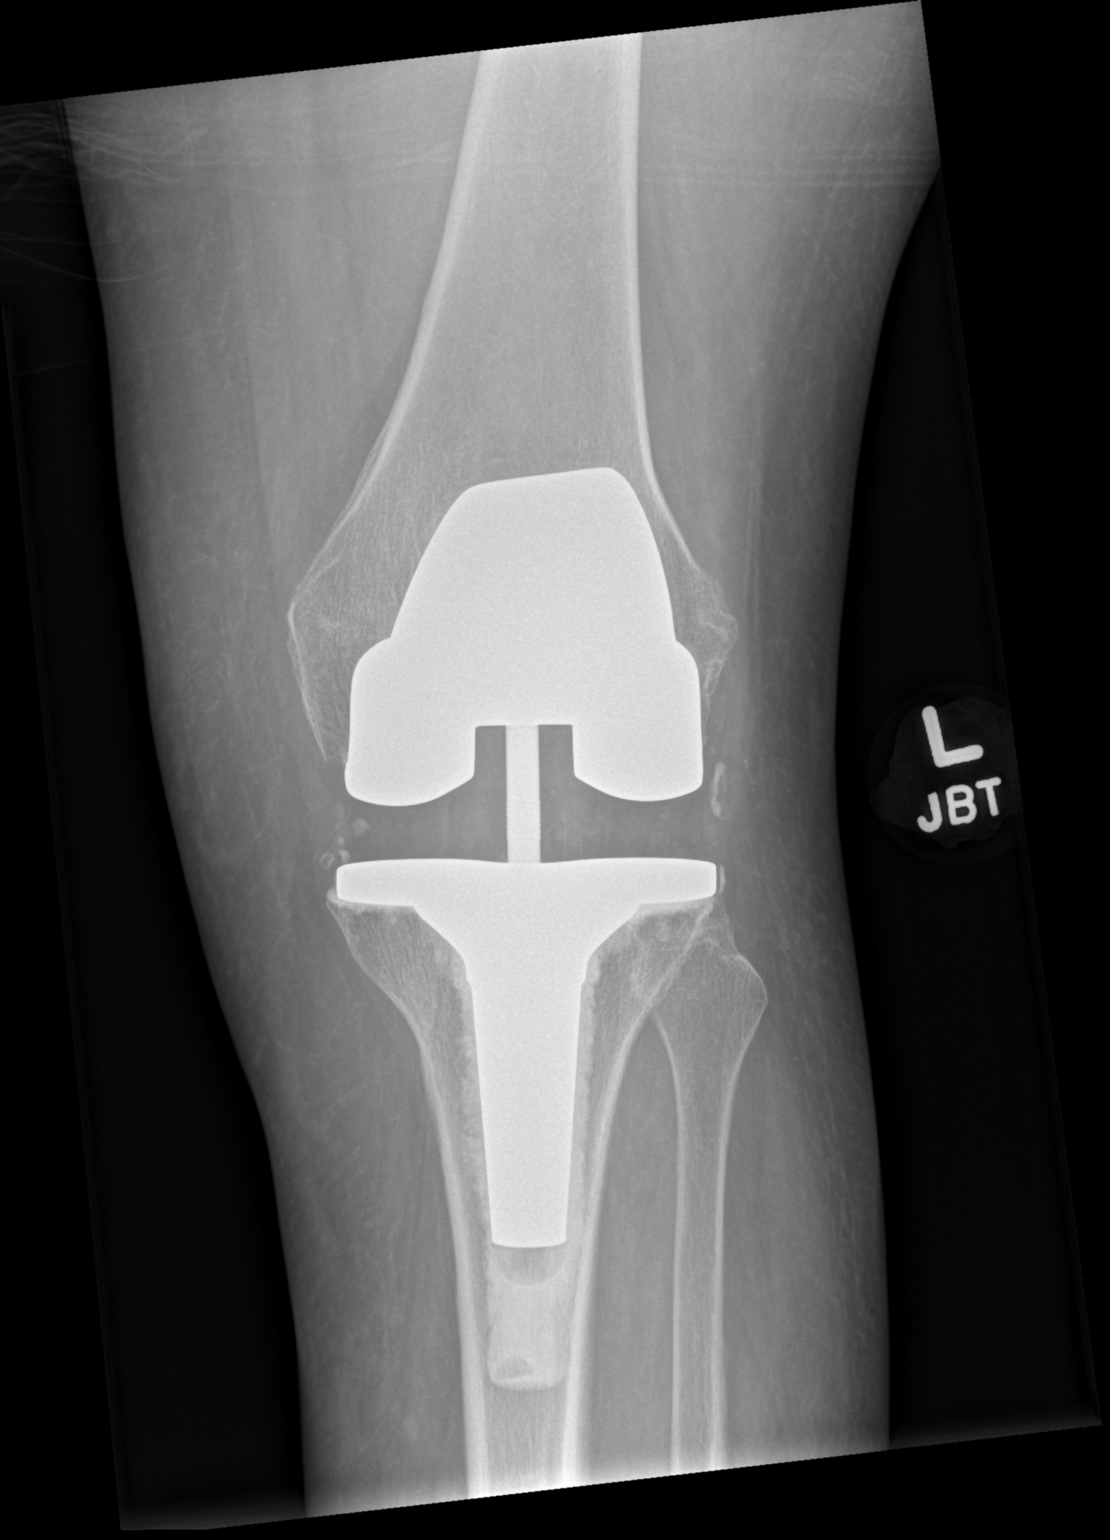

[x knee lat left]
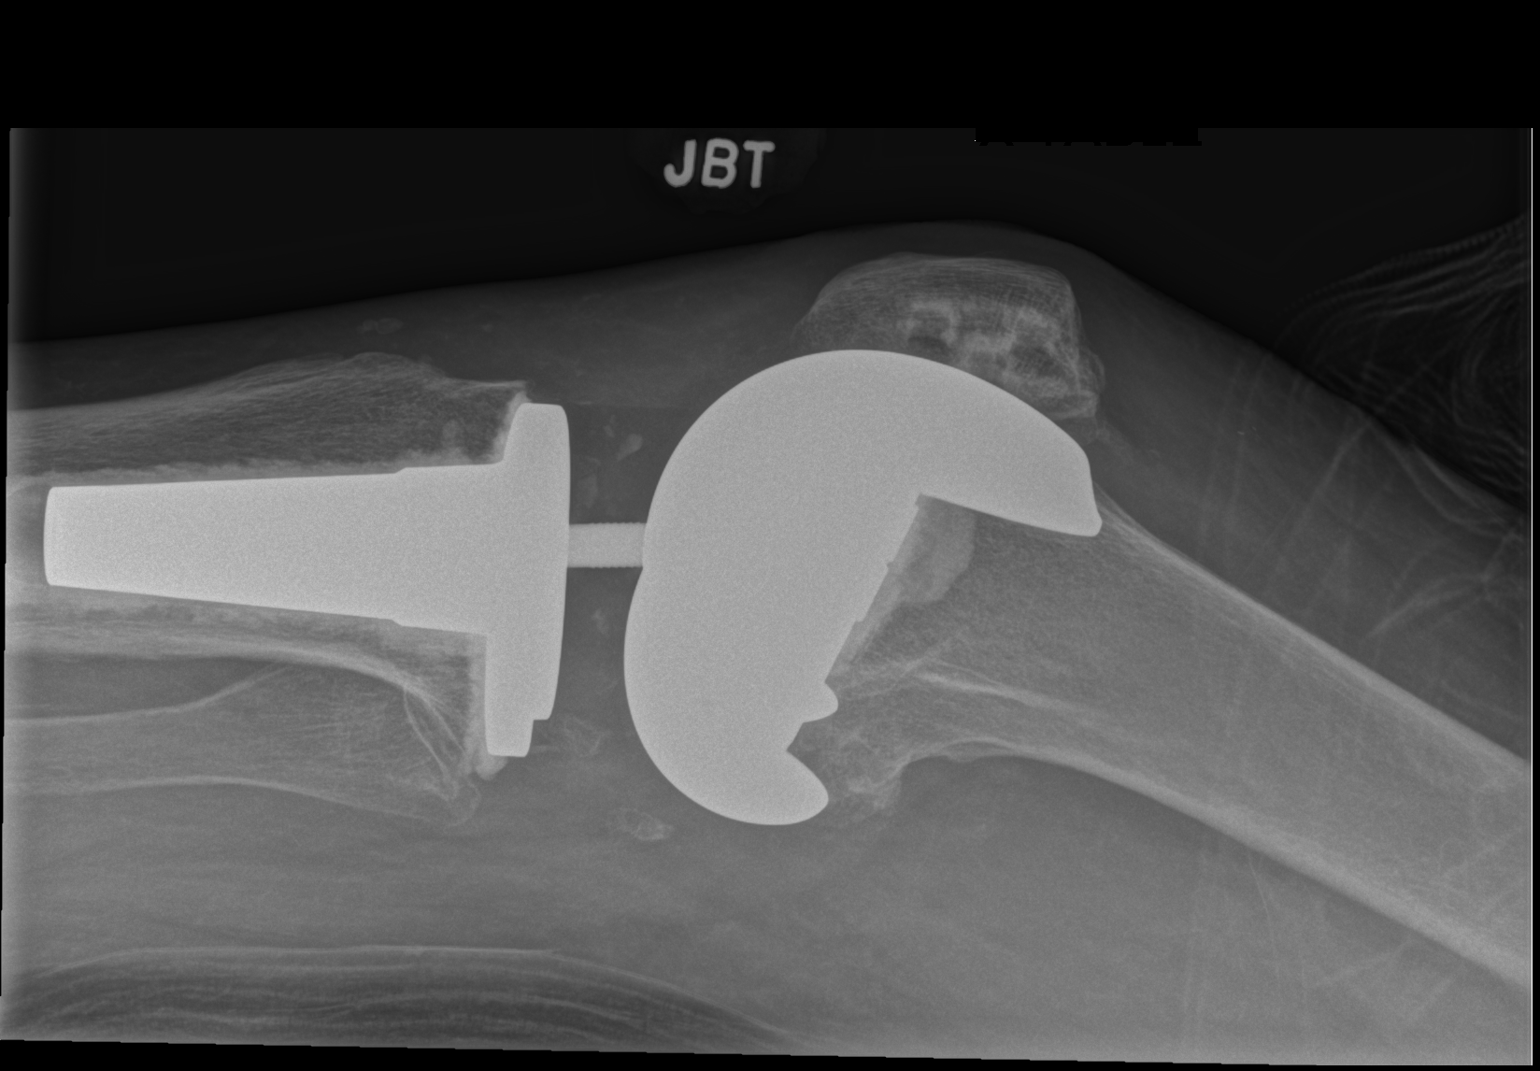

[2 of 2 positions shown; findings below may reference images not displayed]

FINDINGS: Left total knee arthroplasty with spacer.  No evidence of
hardware complication.

No fracture or dislocation is seen.

The visualized soft tissues are unremarkable.  No suprapatellar
knee joint effusion.
IMPRESSION: Left total knee arthroplasty without hardware complication.

No fracture or dislocation is seen.

## 2011-12-10 NOTE — Telephone Encounter (Signed)
PT HAVE NEVER BEEN SEEN HERE BEFORE, BUT SHE IS DR TRUSLOW'S PT AND WAS TOLD THAT WE ARE IN CHARGE OF HIS PT'S WHILE HE'S OUT [facility] IN [place] STATES PT NEED A REFILL ON ALLOPRINOL. AND THEY DO NOT KNOW ANYTHING ABOUT SURE-SCRIPTS PLEASE CALL [phone]    [facility] AT [phone]

## 2011-12-10 NOTE — Telephone Encounter (Signed)
Spoke with [facility] and let them know that we cannot do refills unless we see patient.

## 2011-12-11 IMAGING — NM NM PULMONARY VENT & PERF
2 series · 12 of 12 positions shown · non-contrast
Comparison: Chest radiograph from 06/27/2011

CLINICAL DATA: Rule out pulmonary embolus.  History of prior
pulmonary embolus.  Shortness of breath.

NUCLEAR MEDICINE VENTILATION - PERFUSION LUNG SCAN
TECHNIQUE: Wash-in, equilibrium, and wash-out phase ventilation
images were obtained using Xe-W77 gas.  Perfusion images were
obtained in multiple projections after intravenous injection of Tc-
99m MAA.
Radiopharmaceuticals:  10 mCi Xe-W77 gas and 6.0 mCi Mc-22m MAA.

[vq scan · 2.52mm/px · 6 of 17 frames shown (1 of 2)]
[frame 2/17  full-range]
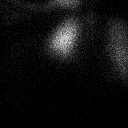
[frame 4/17  full-range]
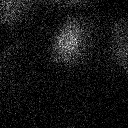
[frame 7/17  full-range]
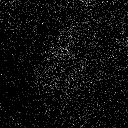
[frame 10/17]
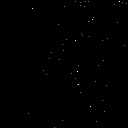
[frame 13/17]
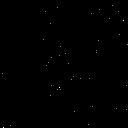
[frame 16/17]
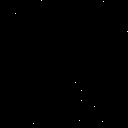

[vq scan · 2.52mm/px · 6 of 17 frames shown (2 of 2)]
[frame 2/17  full-range]
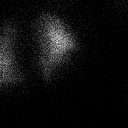
[frame 4/17  full-range]
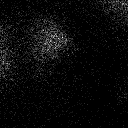
[frame 7/17]
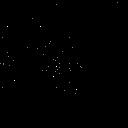
[frame 10/17]
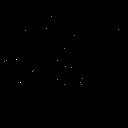
[frame 13/17]
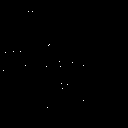
[frame 16/17]
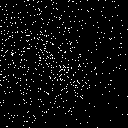

[12 of 12 positions shown; findings below may reference images not displayed]

FINDINGS: On the perfusion portion of the examination there is a
normal distribution of the radiopharmaceutical to both lungs.

No segmental perfusion defects identified.

On the ventilation portion of the examination there is no abnormal
areas of xenon tracer retention.
IMPRESSION: 1.  Very low probability for acute pulmonary embolus.

## 2011-12-12 IMAGING — MR MR LUMBAR SPINE W/O CM
4 of 5 series · 19 of 48 positions shown · non-contrast
Comparison: MRI 05/12/2010

CLINICAL DATA: Spinal stenosis.  Lower extremity weakness

MRI LUMBAR SPINE WITHOUT CONTRAST
TECHNIQUE: Multiplanar and multiecho pulse sequences of the lumbar
spine were obtained without intravenous contrast.

[Series 4: T2 · sagittal · 4.0mm · 0.55mm/px · 6 of 12 slices shown (1 of 2)]
[im 1/12]
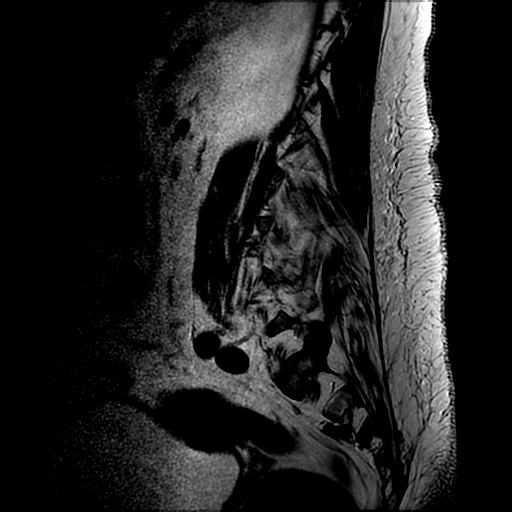
[im 3/12]
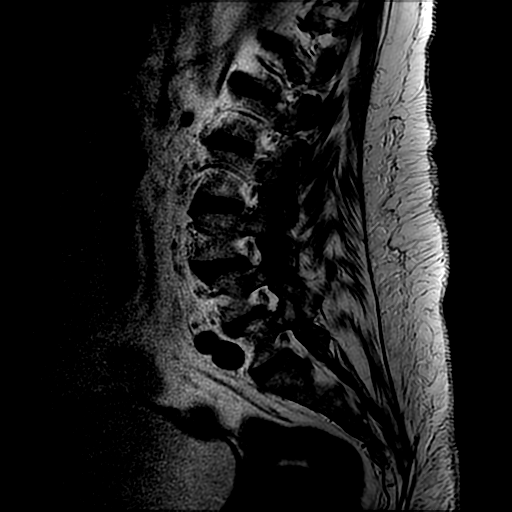
[im 5/12]
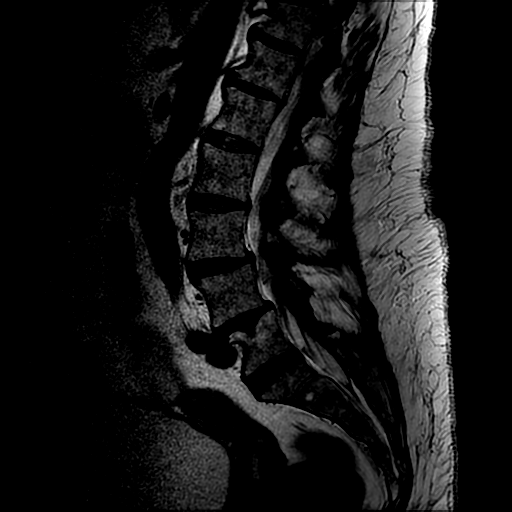
[im 7/12]
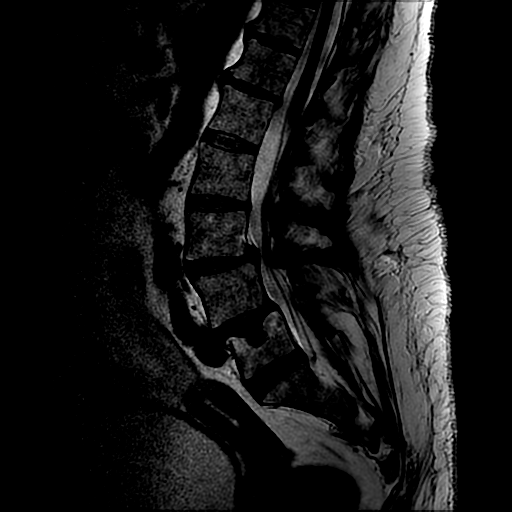
[im 9/12]
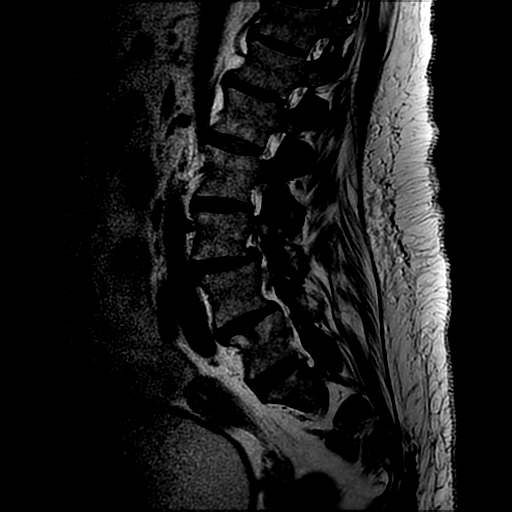
[im 12/12]
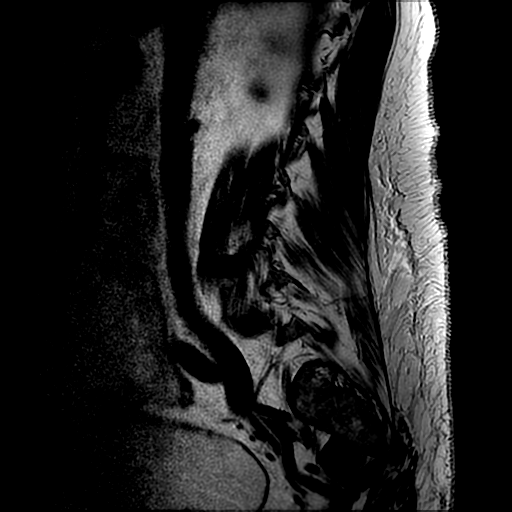

[Series 5: T1 · sagittal · 4.0mm · 0.55mm/px · 3 of 12 slices shown (1 of 2)]
[im 3/12]
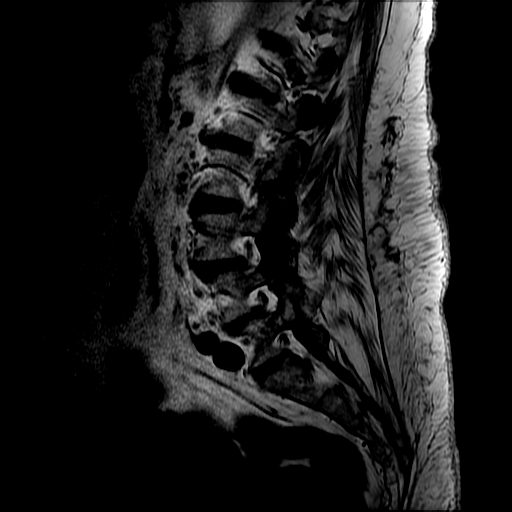
[im 7/12]
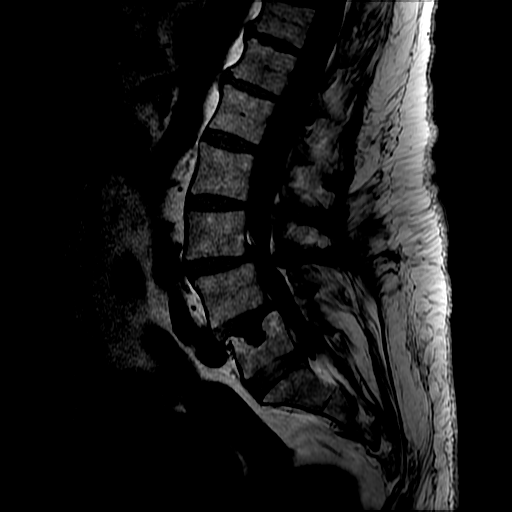
[im 12/12]
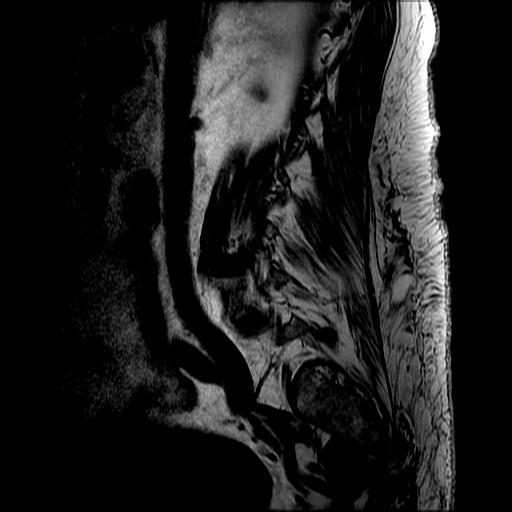

[Series 7: T2 · axial · 4.0mm · 0.39mm/px · z∈[-41,+122]mm · 7 of 31 slices shown (2 of 2)]
[im 1/31]
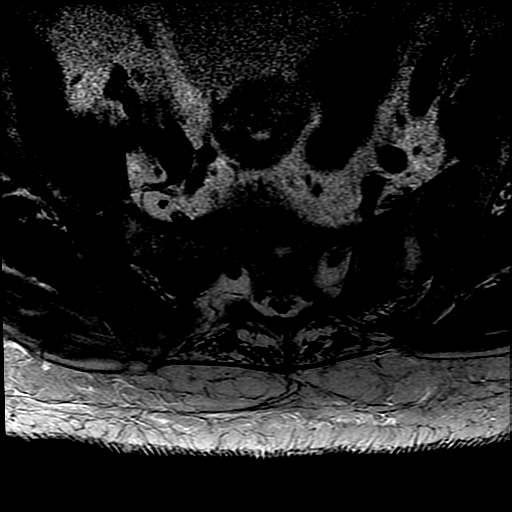
[im 5/31]
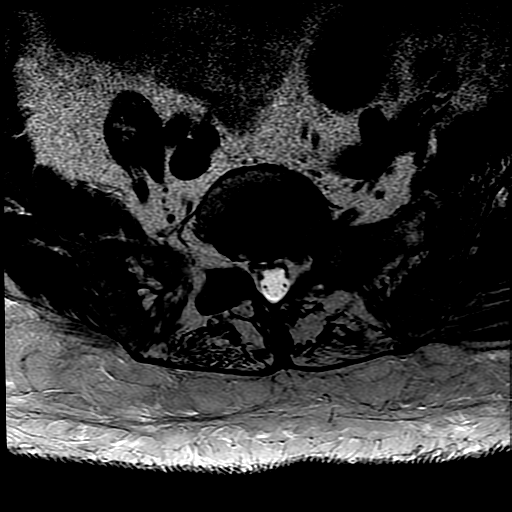
[im 9/31]
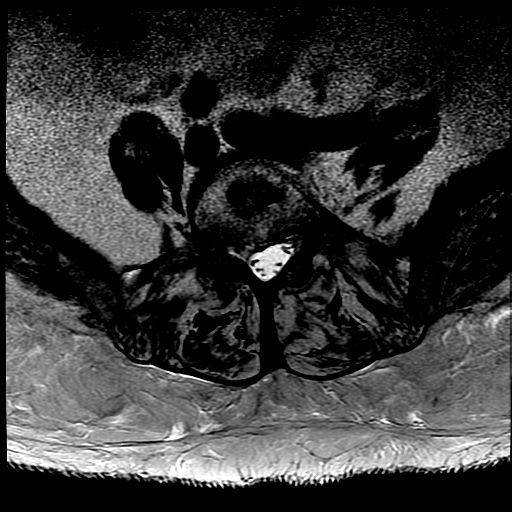
[im 13/31]
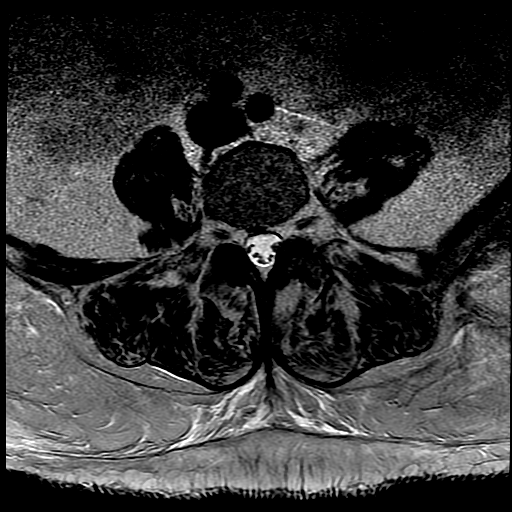
[im 16/31]
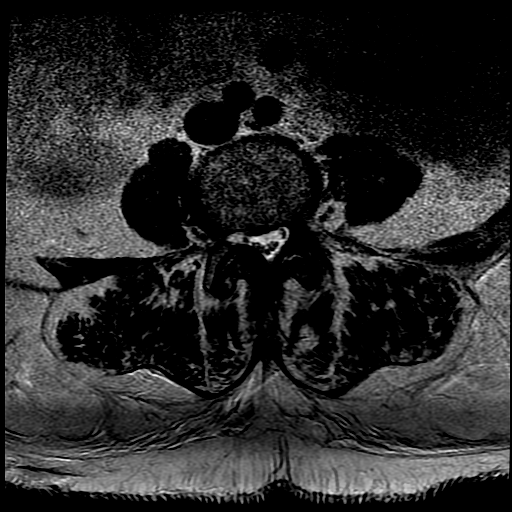
[im 18/31]
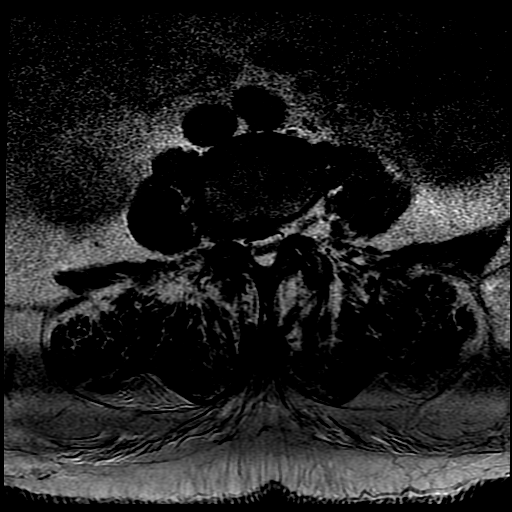
[im 26/31]
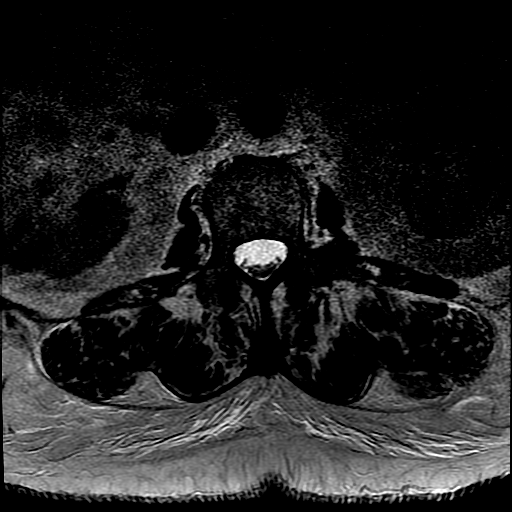

[Series 8: T1 · axial · 4.0mm · 0.39mm/px · z∈[-22,+122]mm · 3 of 31 slices shown (2 of 2)]
[im 5/31]
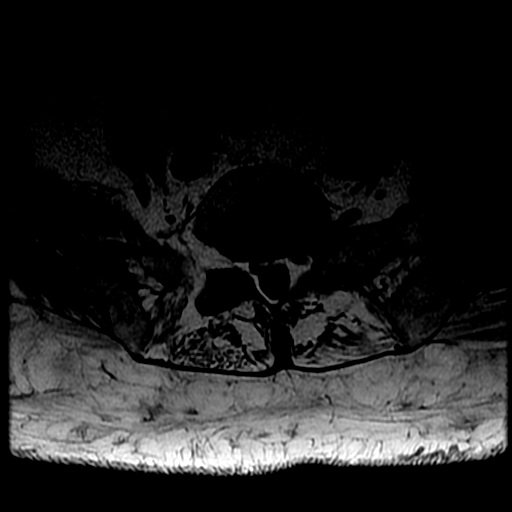
[im 16/31]
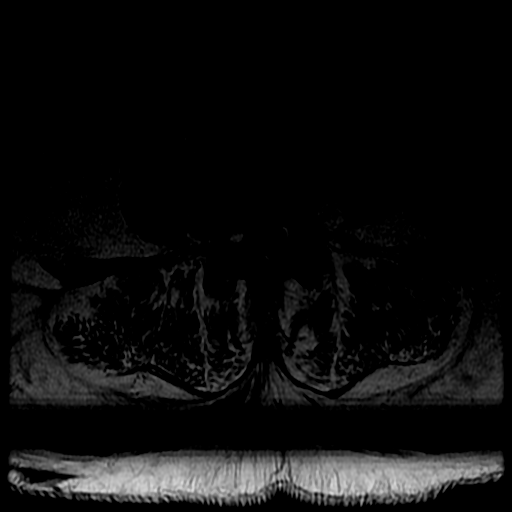
[im 26/31]
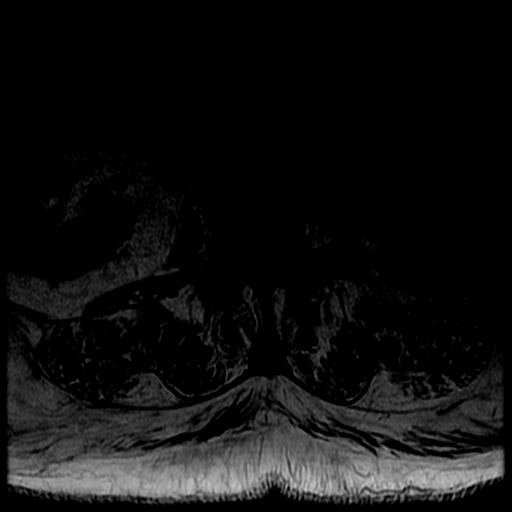

[19 of 48 positions shown; findings below may reference images not displayed]

FINDINGS: Interval development of  prominent endplate deformity
involving  the superior endplate of L5  since the prior study.
This appears to represent a Schmorl's node.  No significant bone
marrow edema.  This does not appear acute.  No acute fracture is
present.  No mass lesion.  Conus medullaris is normal and
terminates at L1-2.

L1-2:  Normal disc.  Mild facet degeneration without stenosis.

L2-3:  Mild disc and facet degeneration with mild spinal stenosis.
Spinal stenosis is slightly more prominent than on the prior study.

L3-4:  Broad-based central and right-sided disc protrusion is
similar to the prior study.  There is moderate to advanced facet
degeneration bilaterally with severe spinal stenosis.  Impingement
of the right L4 nerve root in the lateral recess.  Impingement of
the right L3 nerve root also present in the foramen. Grade 1
anterior slip L3 on L4 is unchanged.

L4-5: Grade 1 slip L4-L5 is unchanged.  Disc and facet degeneration
are present without significant disc protrusion.  Mild spinal
stenosis is unchanged.

L5-S1:  Annular tear and small right foraminal disc protrusion is
unchanged.  Mild facet degeneration without spinal stenosis.
IMPRESSION: At L3-4, there is severe spinal stenosis due to central and right-
sided disc protrusion with osteophyte. Expected impingement of the
right L3 and right L4 nerve roots.  This is similar to the prior
study.

L4-5:  There is grade 1 anterior slip.  Mild spinal stenosis is
unchanged.  Interval development of a prominent superior endplate
defect in the L5 vertebral body without associated bone marrow
edema.

## 2011-12-13 IMAGING — CR DG CHEST 2V
2 series · 2 of 2 positions shown · non-contrast
Comparison: 06/27/2011

CLINICAL DATA: Right rib fracture.  Persistent chest pain.

CHEST - 2 VIEW

[w chest pa]
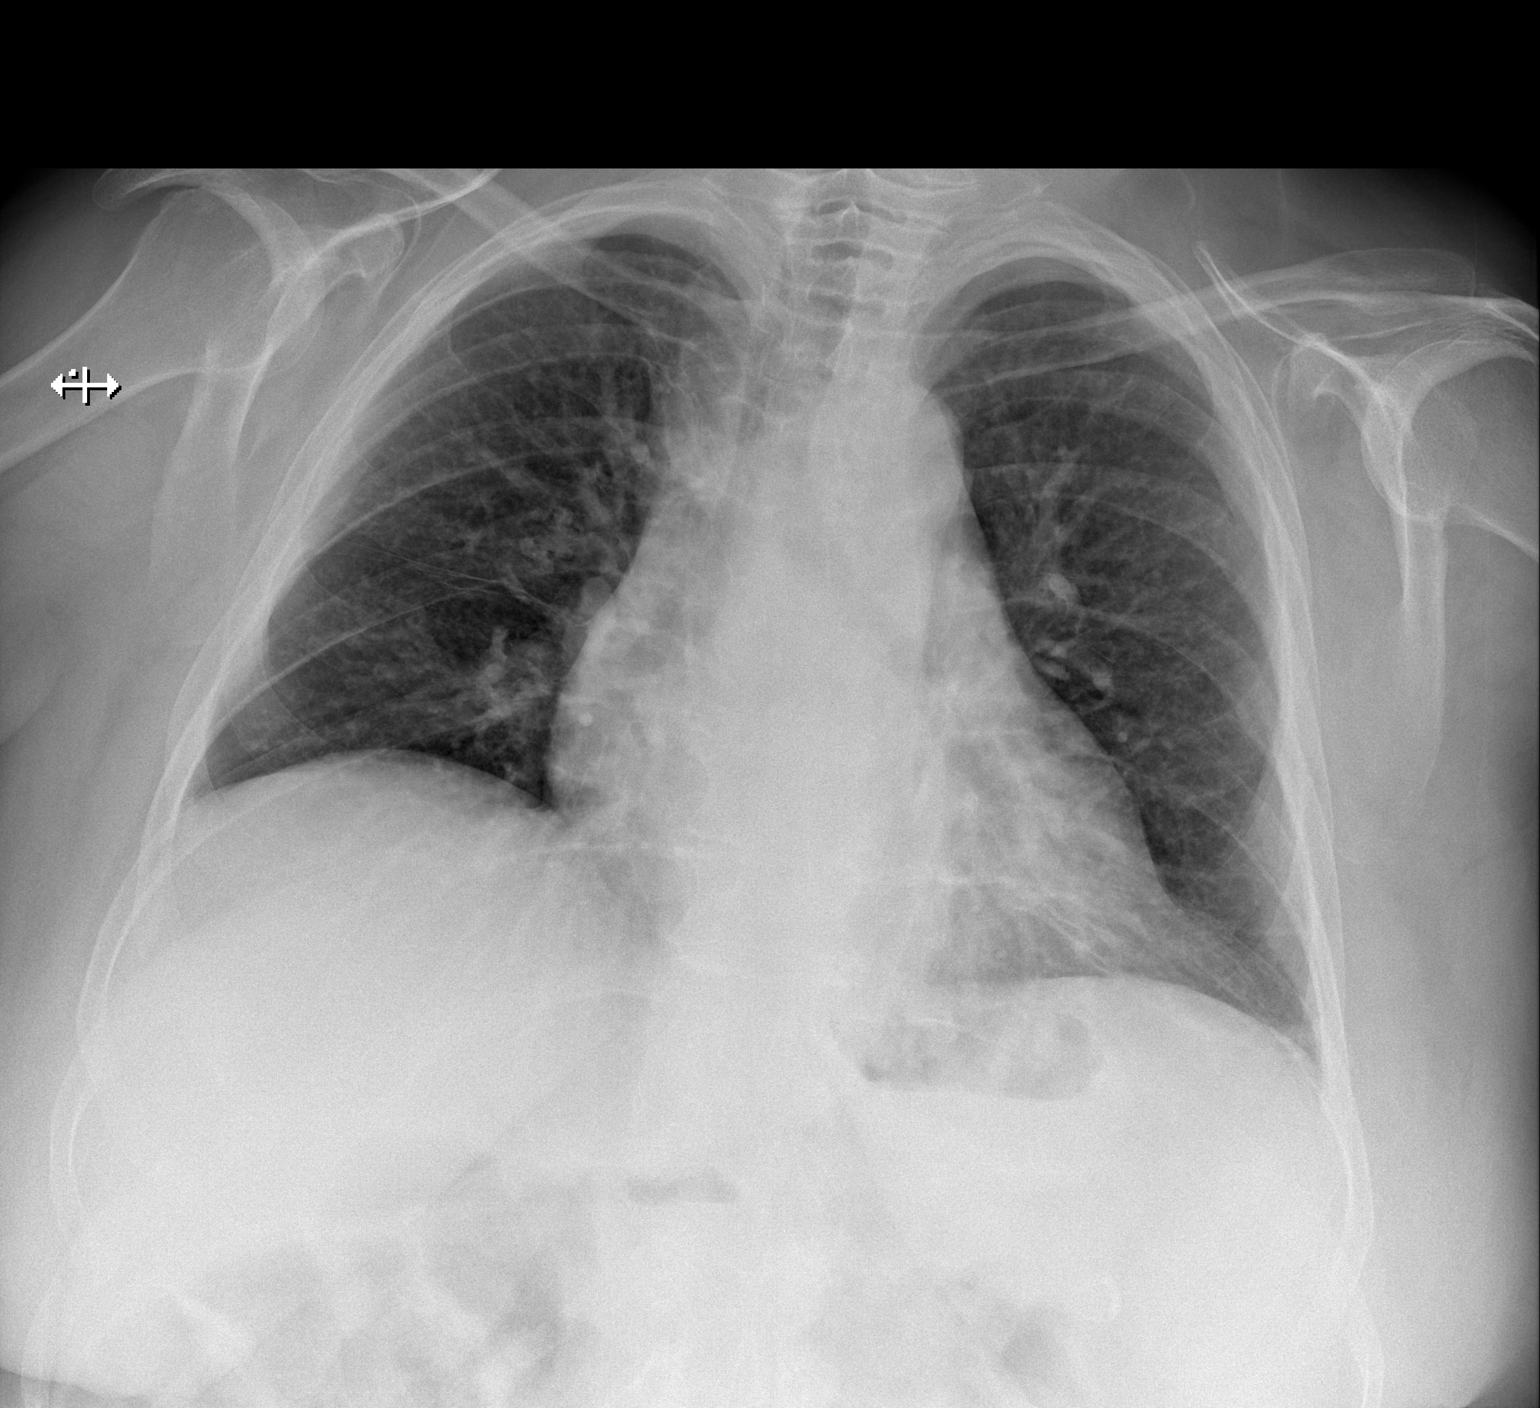

[w chest lat]
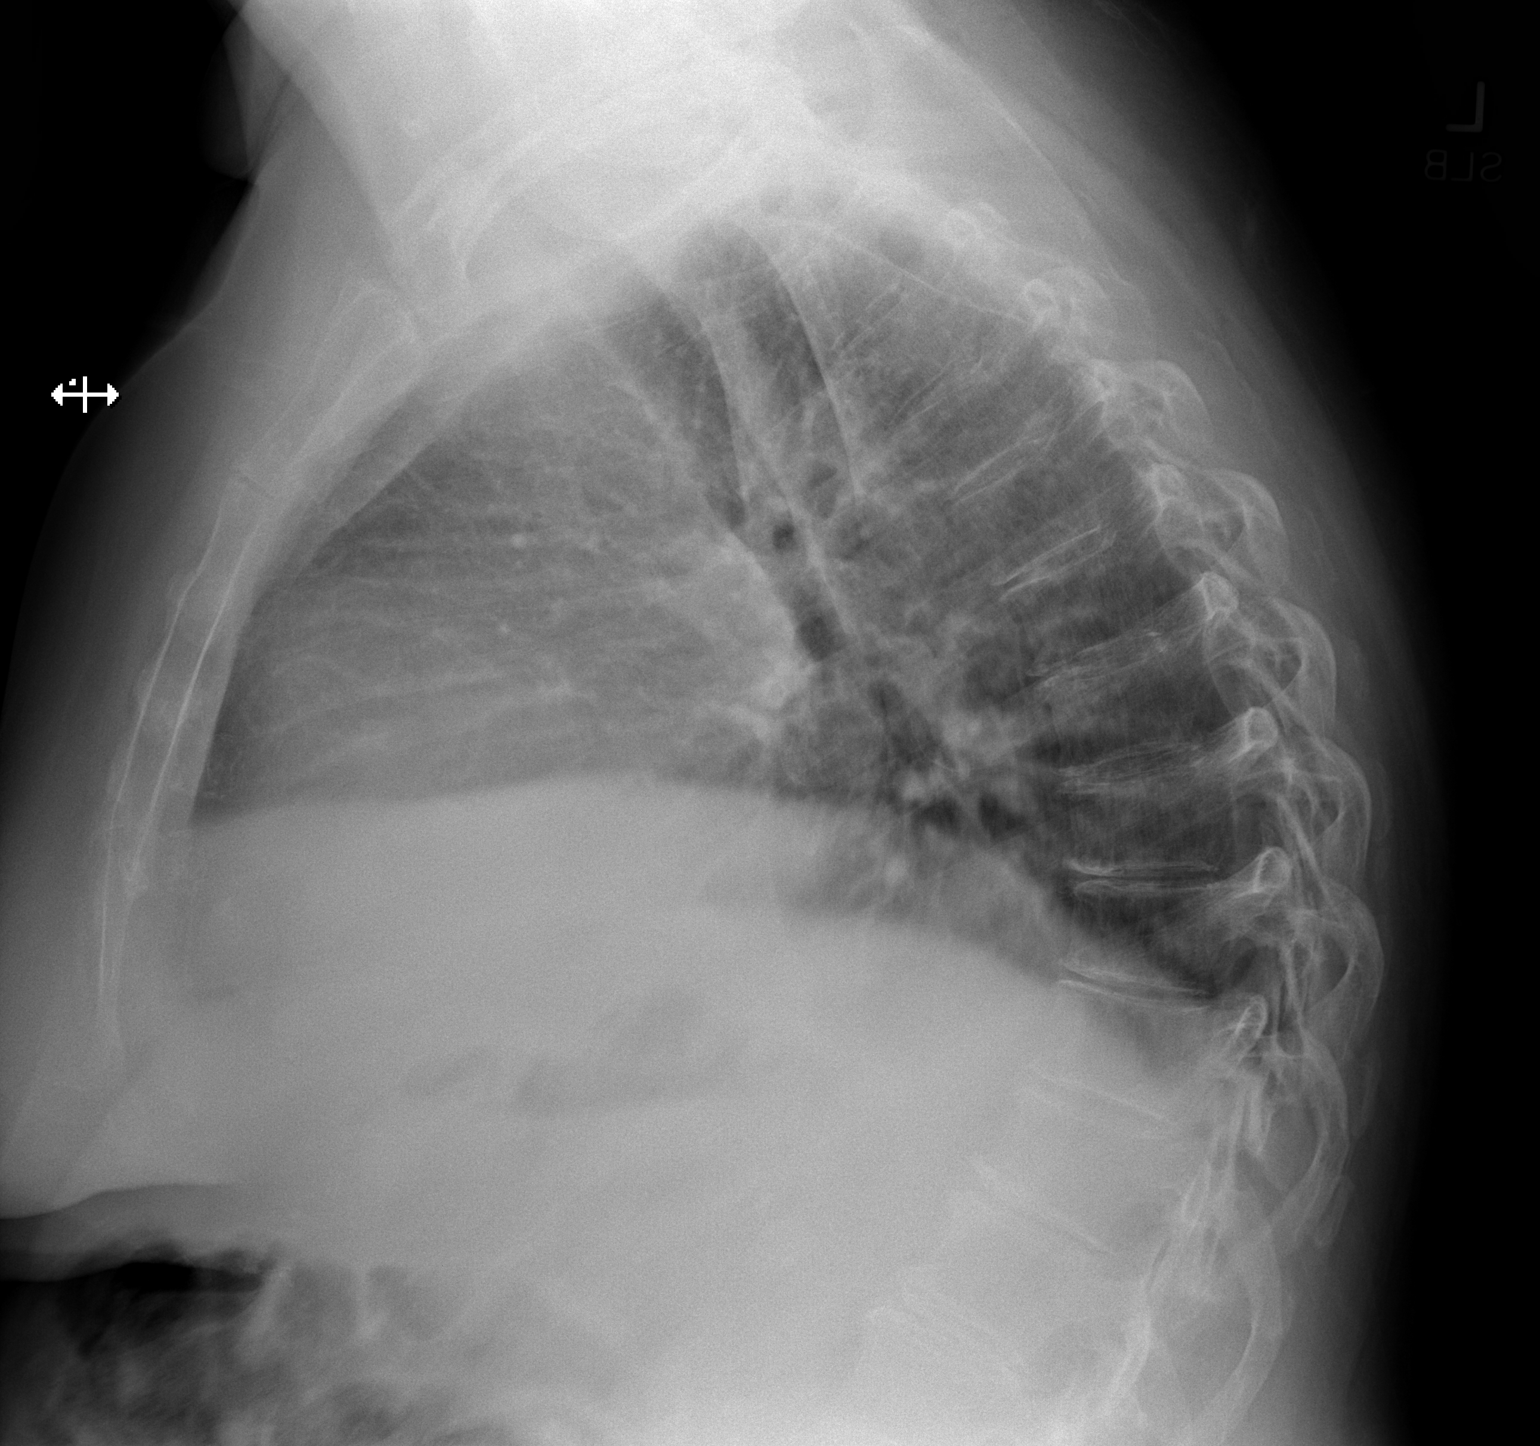

[2 of 2 positions shown; findings below may reference images not displayed]

FINDINGS: Mild cardiac enlargement.

No pleural effusion or pulmonary edema.

Coarsened interstitial markings are identified bilaterally.

The lung volumes are decreased.

No focal airspace consolidation identified.

No displaced rib fractures are noted.
IMPRESSION: 1.  No acute cardiopulmonary abnormalities.
2.  Chronic interstitial coarsening.

## 2011-12-21 ENCOUNTER — Telehealth: Payer: Self-pay | Admitting: Family Medicine

## 2011-12-21 MED ORDER — INSULIN SYRINGES (DISPOSABLE) U-100 0.5 ML MISC: Status: DC

## 2011-12-21 NOTE — Telephone Encounter (Signed)
Patient's niece calling to say due to their move to [facility], need to have rx for syringes sent to [facility] in [place]  Ph# [phone].  Should have a fax sent already to provider regarding this.

## 2011-12-21 NOTE — Telephone Encounter (Signed)
Rx sent and message left on voicemail that they have been sent.

## 2011-12-30 ENCOUNTER — Other Ambulatory Visit: Payer: Self-pay | Admitting: Family Medicine

## 2011-12-30 MED ORDER — CLONIDINE HCL 0.2 MG PO TABS: 0.4000 mg | ORAL_TABLET | Freq: Every day | ORAL | Status: DC

## 2012-01-04 ENCOUNTER — Other Ambulatory Visit: Payer: Self-pay | Admitting: Family Medicine

## 2012-01-04 ENCOUNTER — Telehealth: Payer: Self-pay | Admitting: Family Medicine

## 2012-01-04 MED ORDER — DOXEPIN HCL 100 MG PO CAPS: 100.0000 mg | ORAL_CAPSULE | Freq: Every day | ORAL | Status: DC

## 2012-01-04 NOTE — Telephone Encounter (Signed)
Pharmacy sent refill request yesterday for Doxepin 100 mg and it was placed in MD box.  Niece calls today stating for past two nights she has used the 25 mg capsules to make 125 mg  and this will cause her to run short of the 25 mg capsules whens he needs that refilled.  Consulted with Dr. McDiarmid and he will send in rx now for the 100 mg capsule.[name] Kitchen

## 2012-01-04 NOTE — Telephone Encounter (Signed)
Please call niece back regarding rx given to a few minutes ago.  She has concerns about it.

## 2012-01-05 ENCOUNTER — Other Ambulatory Visit: Payer: Self-pay | Admitting: Family Medicine

## 2012-01-05 MED ORDER — ESCITALOPRAM OXALATE 10 MG PO TABS: 10.0000 mg | ORAL_TABLET | Freq: Every day | ORAL | Status: DC

## 2012-01-26 ENCOUNTER — Other Ambulatory Visit: Payer: Self-pay | Admitting: Family Medicine

## 2012-01-26 MED ORDER — DOXEPIN HCL 100 MG PO CAPS: 100.0000 mg | ORAL_CAPSULE | Freq: Every day | ORAL | Status: DC

## 2012-01-26 MED ORDER — PREGABALIN 100 MG PO CAPS: 100.0000 mg | ORAL_CAPSULE | Freq: Three times a day (TID) | ORAL | Status: DC

## 2012-02-18 ENCOUNTER — Other Ambulatory Visit: Payer: Self-pay | Admitting: *Deleted

## 2012-02-20 MED ORDER — METFORMIN HCL 500 MG PO TABS: 500.0000 mg | ORAL_TABLET | Freq: Two times a day (BID) | ORAL | Status: DC

## 2012-02-29 ENCOUNTER — Other Ambulatory Visit: Payer: Self-pay | Admitting: Family Medicine

## 2012-02-29 MED ORDER — FUROSEMIDE 80 MG PO TABS: 80.0000 mg | ORAL_TABLET | Freq: Two times a day (BID) | ORAL | Status: DC

## 2012-02-29 MED ORDER — BETHANECHOL CHLORIDE 5 MG PO TABS: 5.0000 mg | ORAL_TABLET | Freq: Two times a day (BID) | ORAL | Status: DC

## 2012-02-29 NOTE — Progress Notes (Signed)
Received refill requests for bethanechol and furosemide.  Patient has not had OV since Nov. 2012.  Dr. Clovis Riley refill note from April stated that she would need OV before additional refills given.  I will fill both for 30 days but no further refills until seen in office.

## 2012-03-19 IMAGING — CR DG CHEST 1V PORT
1 series · 1 of 1 positions shown · non-contrast
Comparison: Chest x-ray of 06/30/2011

CLINICAL DATA: Weakness, confusion

PORTABLE CHEST - 1 VIEW

[AP]
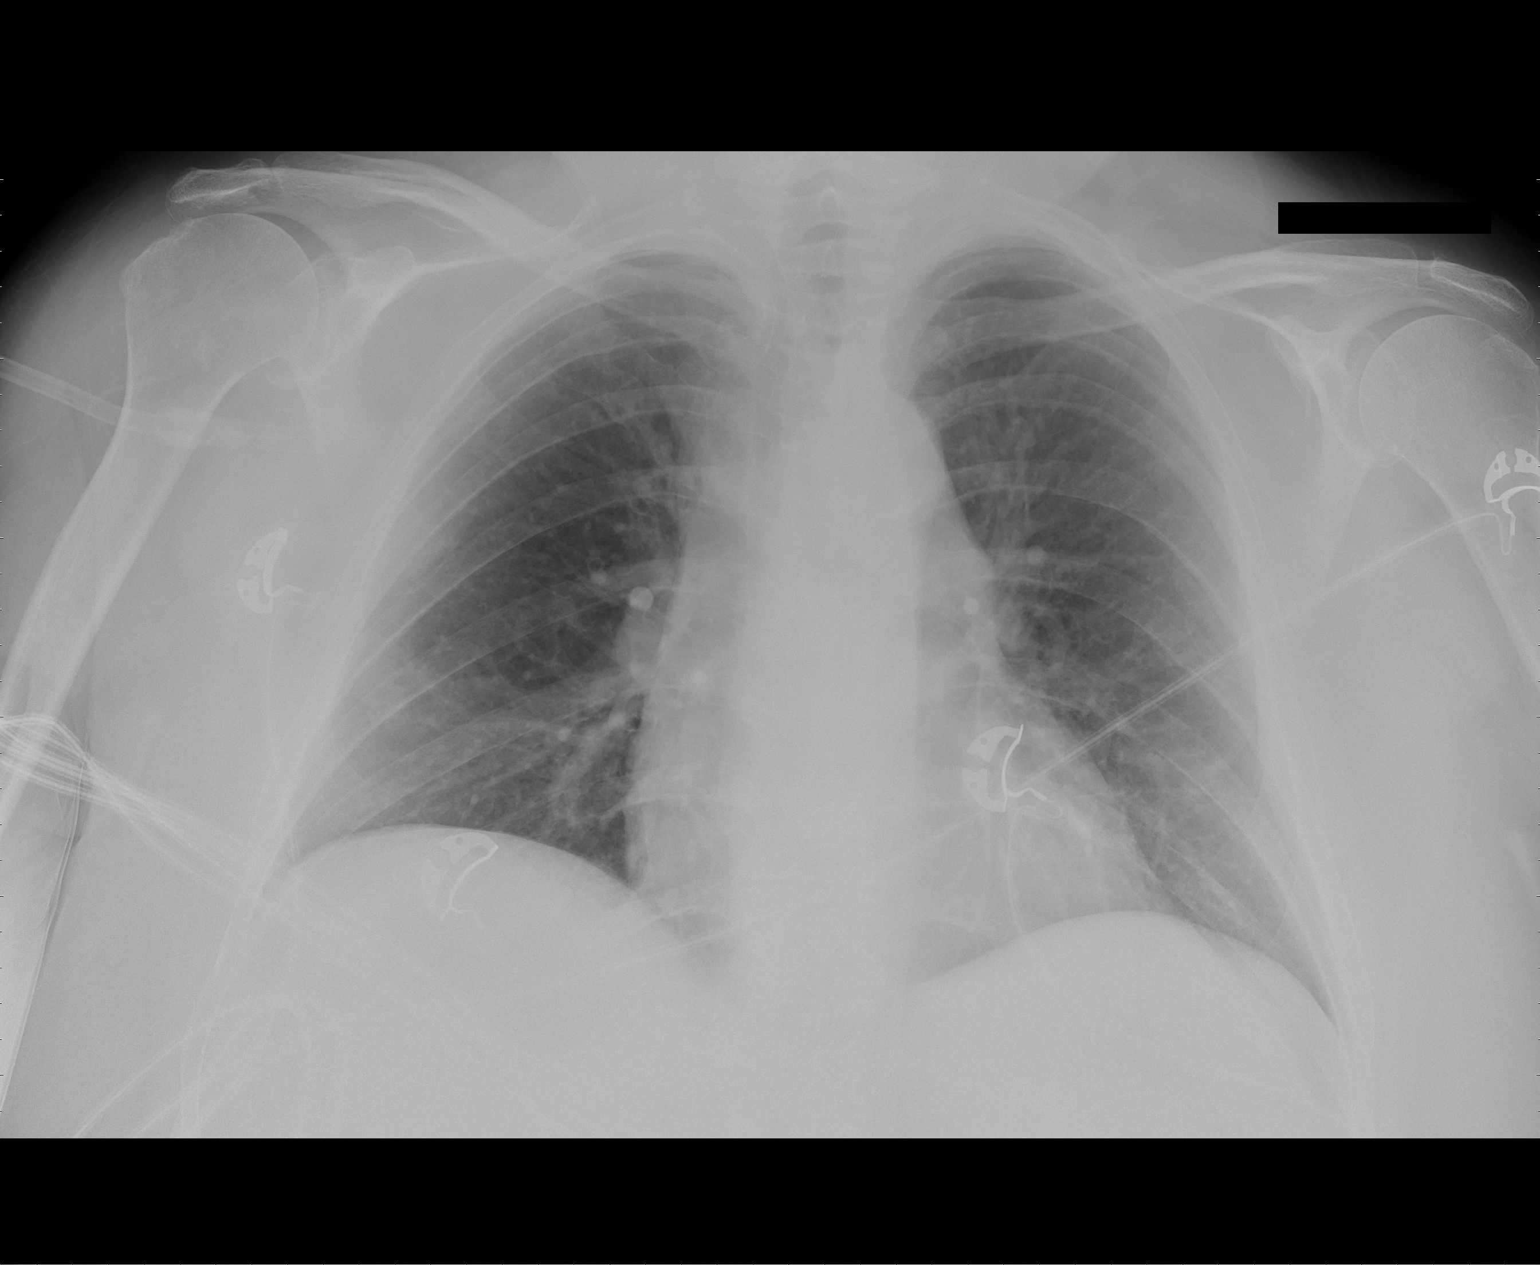

[1 of 1 positions shown; findings below may reference images not displayed]

FINDINGS: Linear scarring or atelectasis is noted at the left lung
base.  No active infiltrate or effusion is seen.  Mediastinal
contours are stable.  The heart is mildly enlarged and stable.  No
bony abnormality is seen.
IMPRESSION: No active lung disease.  Mild cardiomegaly.

## 2012-03-22 ENCOUNTER — Other Ambulatory Visit: Payer: Self-pay | Admitting: *Deleted

## 2012-03-24 MED ORDER — HYDROCHLOROTHIAZIDE 25 MG PO TABS: 25.0000 mg | ORAL_TABLET | Freq: Two times a day (BID) | ORAL | Status: DC

## 2012-03-29 ENCOUNTER — Other Ambulatory Visit: Payer: Self-pay | Admitting: *Deleted

## 2012-03-29 NOTE — Telephone Encounter (Signed)
Patient has moved to [place].  Can not get in with new medical doctor until 06/13/2012.  Will be going to [facility] in [place] but needs enough medication to get her to this appointment.  Will send refill request to Dr. Zigmund Daniel for review.  Lauralyn Primes

## 2012-03-30 ENCOUNTER — Other Ambulatory Visit: Payer: Self-pay | Admitting: Family Medicine

## 2012-03-30 MED ORDER — PREGABALIN 100 MG PO CAPS: 100.0000 mg | ORAL_CAPSULE | Freq: Three times a day (TID) | ORAL | Status: DC

## 2012-03-30 MED ORDER — FUROSEMIDE 80 MG PO TABS: 80.0000 mg | ORAL_TABLET | Freq: Two times a day (BID) | ORAL | Status: DC

## 2012-03-30 MED ORDER — DOXEPIN HCL 100 MG PO CAPS: 100.0000 mg | ORAL_CAPSULE | Freq: Every day | ORAL | Status: DC

## 2012-04-12 ENCOUNTER — Other Ambulatory Visit: Payer: Self-pay | Admitting: *Deleted

## 2012-04-12 MED ORDER — METFORMIN HCL 500 MG PO TABS: 500.0000 mg | ORAL_TABLET | Freq: Two times a day (BID) | ORAL | Status: DC

## 2012-04-19 ENCOUNTER — Telehealth: Payer: Self-pay | Admitting: Family Medicine

## 2012-04-19 NOTE — Telephone Encounter (Signed)
Niece would like to speak to the nurse or doctor about having her Aunt placed somewhere.

## 2012-04-19 NOTE — Telephone Encounter (Signed)
Called [name] and [name] 'please schedule OV with your new PCP Dr. Zigmund Daniel'. (we have not seen the pt in almost one year) .[name]

## 2012-04-24 ENCOUNTER — Other Ambulatory Visit: Payer: Self-pay | Admitting: Family Medicine

## 2012-05-04 ENCOUNTER — Telehealth: Payer: Self-pay

## 2012-05-04 DIAGNOSIS — E1149 Type 2 diabetes mellitus with other diabetic neurological complication: Secondary | ICD-10-CM

## 2012-05-04 NOTE — Telephone Encounter (Signed)
Ms. Emelia Salisbury, niece of Ms. Dresden need refills on her med.  Moved to [place] but can't get in with her new provider until 10/29.  Please contact her back to discuss refills or possible appt needed before sending in rxs.

## 2012-05-04 NOTE — Telephone Encounter (Signed)
Patient is on a large number of medications some of which like insuline and mtx can be dangerous if not prescribed exactly correctly.  She would need to have an office visit and bring in ALL her medication BOTTLES  Thanks  [place]

## 2012-05-05 MED ORDER — PREGABALIN 100 MG PO CAPS: 100.0000 mg | ORAL_CAPSULE | Freq: Three times a day (TID) | ORAL | Status: DC

## 2012-05-05 NOTE — Telephone Encounter (Signed)
This refill request was sent to Dr Georgina Snell - needs for tonight.

## 2012-05-05 NOTE — Addendum Note (Signed)
Addended by: Burna Forts A on: 05/05/2012 04:26 PM   Modules accepted: Orders

## 2012-05-05 NOTE — Telephone Encounter (Addendum)
Patient's niece Helene Kelp) calling back to see if we can refill enough meds to last until patient can come in for an office visit.  Appt scheduled for 05/08/12 with Dr. Loraine Maple for DM/med refill.  Pharmacy is able to give patient a 3-day supply of all meds except Lyrica.  Ok per Dr. Mingo Amber to refill Lyrica for 3 days only.  Patient can get 1 month supply of all meds when she comes to appt next week.  Helene Kelp verbalized understanding.  Lyrica 100mg  #10--take 1 tab TID x no refills called to [place].  Nolene Ebbs, RN

## 2012-05-08 ENCOUNTER — Ambulatory Visit (INDEPENDENT_AMBULATORY_CARE_PROVIDER_SITE_OTHER): Payer: Medicaid Other | Admitting: Family Medicine

## 2012-05-08 ENCOUNTER — Encounter: Payer: Self-pay | Admitting: Family Medicine

## 2012-05-08 VITALS — BP 112/66 | HR 93 | Ht 60.0 in | Wt 194.2 lb

## 2012-05-08 DIAGNOSIS — E1142 Type 2 diabetes mellitus with diabetic polyneuropathy: Secondary | ICD-10-CM

## 2012-05-08 DIAGNOSIS — E1149 Type 2 diabetes mellitus with other diabetic neurological complication: Secondary | ICD-10-CM

## 2012-05-08 MED ORDER — CETIRIZINE HCL 10 MG PO TABS: 10.0000 mg | ORAL_TABLET | Freq: Every day | ORAL | Status: DC

## 2012-05-08 MED ORDER — PANTOPRAZOLE SODIUM 40 MG PO TBEC: 40.0000 mg | DELAYED_RELEASE_TABLET | Freq: Two times a day (BID) | ORAL | Status: DC

## 2012-05-08 MED ORDER — DOXEPIN HCL 25 MG PO CAPS: 25.0000 mg | ORAL_CAPSULE | Freq: Every day | ORAL | Status: DC

## 2012-05-08 MED ORDER — ALBUTEROL SULFATE HFA 108 (90 BASE) MCG/ACT IN AERS: 1.0000 | INHALATION_SPRAY | RESPIRATORY_TRACT | Status: DC

## 2012-05-08 MED ORDER — TRAMADOL-ACETAMINOPHEN 37.5-325 MG PO TABS: 1.0000 | ORAL_TABLET | Freq: Four times a day (QID) | ORAL | Status: DC | PRN

## 2012-05-08 MED ORDER — FUROSEMIDE 80 MG PO TABS: 80.0000 mg | ORAL_TABLET | Freq: Two times a day (BID) | ORAL | Status: DC

## 2012-05-08 MED ORDER — PREGABALIN 100 MG PO CAPS: 100.0000 mg | ORAL_CAPSULE | Freq: Three times a day (TID) | ORAL | Status: DC

## 2012-05-08 MED ORDER — DOXEPIN HCL 100 MG PO CAPS: 100.0000 mg | ORAL_CAPSULE | Freq: Every day | ORAL | Status: DC

## 2012-05-08 MED ORDER — METFORMIN HCL 500 MG PO TABS: 500.0000 mg | ORAL_TABLET | Freq: Two times a day (BID) | ORAL | Status: DC

## 2012-05-08 MED ORDER — OXYBUTYNIN CHLORIDE 5 MG PO TABS: 5.0000 mg | ORAL_TABLET | Freq: Two times a day (BID) | ORAL | Status: DC

## 2012-05-08 MED ORDER — HYDROCHLOROTHIAZIDE 25 MG PO TABS: 25.0000 mg | ORAL_TABLET | Freq: Two times a day (BID) | ORAL | Status: DC

## 2012-05-08 MED ORDER — BETHANECHOL CHLORIDE 5 MG PO TABS: 5.0000 mg | ORAL_TABLET | Freq: Two times a day (BID) | ORAL | Status: DC

## 2012-05-08 NOTE — Progress Notes (Signed)
S: Pt comes in today for SDA to have meds refilled.  Moved to [place] and has a new PCP out there, but appt is not until 06/13/12 and pt needs meds refilled before then.  Is not having any issues or problems.    ROS: Per HPI  History  Smoking status  . Former Smoker  . Quit date: 06/23/1972  Smokeless tobacco  . Never Used    O:  Filed Vitals:   05/08/12 1513  BP: 112/66  Pulse: 93    Gen: NAD, very pleasant    A/P: 61 y.o. female p/w need for multiple med refills while waiting to be seen at new MD in [place] -See med refills per visit orders -f/u PRN

## 2012-05-08 NOTE — Patient Instructions (Signed)
It was nice to meet you.  I am refilling all of the medicines that she has no refills for.  Then, after she is seen next month, her new MD will prescribe them.  Come back for any problems between now and the end of October.

## 2012-05-11 ENCOUNTER — Other Ambulatory Visit: Payer: Self-pay | Admitting: *Deleted

## 2012-05-11 MED ORDER — BETHANECHOL CHLORIDE 5 MG PO TABS: 5.0000 mg | ORAL_TABLET | Freq: Two times a day (BID) | ORAL | Status: DC

## 2012-06-01 ENCOUNTER — Other Ambulatory Visit: Payer: Self-pay | Admitting: Family Medicine

## 2012-06-09 ENCOUNTER — Other Ambulatory Visit: Payer: Self-pay | Admitting: Family Medicine

## 2012-06-11 NOTE — Telephone Encounter (Signed)
Pt has new PCP

## 2012-07-01 ENCOUNTER — Other Ambulatory Visit: Payer: Self-pay | Admitting: Family Medicine

## 2012-07-18 ENCOUNTER — Other Ambulatory Visit: Payer: Self-pay | Admitting: Family Medicine

## 2013-08-02 ENCOUNTER — Ambulatory Visit (INDEPENDENT_AMBULATORY_CARE_PROVIDER_SITE_OTHER): Payer: Medicaid Other

## 2013-08-02 VITALS — BP 149/82 | HR 72 | Temp 97.7°F | Resp 16

## 2013-08-02 DIAGNOSIS — E1149 Type 2 diabetes mellitus with other diabetic neurological complication: Secondary | ICD-10-CM

## 2013-08-02 DIAGNOSIS — M2040 Other hammer toe(s) (acquired), unspecified foot: Secondary | ICD-10-CM

## 2013-08-02 DIAGNOSIS — E11621 Type 2 diabetes mellitus with foot ulcer: Secondary | ICD-10-CM

## 2013-08-02 DIAGNOSIS — M2011 Hallux valgus (acquired), right foot: Secondary | ICD-10-CM

## 2013-08-02 DIAGNOSIS — M2010 Hallux valgus (acquired), unspecified foot: Secondary | ICD-10-CM

## 2013-08-02 DIAGNOSIS — E1142 Type 2 diabetes mellitus with diabetic polyneuropathy: Secondary | ICD-10-CM

## 2013-08-02 DIAGNOSIS — L97509 Non-pressure chronic ulcer of other part of unspecified foot with unspecified severity: Secondary | ICD-10-CM

## 2013-08-02 DIAGNOSIS — E1140 Type 2 diabetes mellitus with diabetic neuropathy, unspecified: Secondary | ICD-10-CM

## 2013-08-02 DIAGNOSIS — E1169 Type 2 diabetes mellitus with other specified complication: Secondary | ICD-10-CM

## 2013-08-02 MED ORDER — MUPIROCIN 2 % EX OINT: 1.0000 "application " | TOPICAL_OINTMENT | Freq: Two times a day (BID) | CUTANEOUS | Status: DC

## 2013-08-02 NOTE — Progress Notes (Signed)
   Subjective:    Patient ID: [name], female    DOB: 07-Jun-1951, 62 y.o.   MRN: [id number]  HPI I have a place on the ball of my right foot and has been going on since sept of this year and burns and throbs and has been draining and there is a doctor that comes to the nursing home and they checked it Patient has a hemorrhage a keratosis and ulceration sub-first MTP area right foot. Been present for 3-4 months according to the patient   Review of Systems  HENT: Negative.   Eyes: Negative.   Respiratory: Negative.   Cardiovascular: Negative.   Gastrointestinal: Negative.   Endocrine: Positive for cold intolerance.  Genitourinary: Negative.   Musculoskeletal: Negative.   Skin: Negative.   Allergic/Immunologic: Negative.   Neurological: Positive for weakness.  Hematological: Bruises/bleeds easily.  Psychiatric/Behavioral: Negative.        Objective:   Physical Exam Vascular status is intact as follows pedal pulses DP +2/4 PT pulse one over 4 bilateral. Refill time 3 seconds all digits skin temperature warm to cool turgor diminished there is no edema rubor pallor noted mild varicosities noted. Neurologically epicritic and proprioceptive sensations are grossly diminished on [employer] testing. There is absent sensation to the toes plantar foot and arch and heel. DTRs not elicited. There is abnormal loss of sensation and loss of vibratory sensations well. Orthopedic biomechanical exam reveals relatively rectus foot type semirigid digital contractures 2 through 5 bilateral slight bunion deformity with plantarflexed first metatarsal bilateral right more significant. There is hemorrhage a keratoses and ulceration following debridement approximate 5 mm diameter ulcers noted down to dermal subdermal junction. There is mild serous discharge or drainage noted no purulence no ascending cellulitis or lymphangitis. Patient is afebrile with vital signs are otherwise stable patient is ambulating  minimally will see a wheelchair but does have diabetic shoes with Plastizote insoles       Assessment & Plan:  Assessment this time diabetes with peripheral neuropathy. Plantar grade metatarsal with ulceration sub-1 right. At this time the ulcer site is debrided down to a slight pinpoint bleeding to subcutaneous level. Silvadene and gauze dressing are applied patient given instructions at this time for daily dressing changes prescription for Bactroban ointment and daily dressing change as recommended at this time orders for home nursing provide dressing changes daily with Bactroban ointment and gauze dressing. Cleansing with soap and water in a daily basis and reapplied clean dry dressing reappointed in 3 weeks for followup and reevaluation. Maintain appropriate shoes and Plastizote insoles all times and minimal moderate ambulation allowed.  Harriet Masson DPM

## 2013-08-02 NOTE — Patient Instructions (Addendum)
Instructions for Wound Care  The most important step to healing a foot wound is to reduce the pressure on your foot - it is extremely important to stay off your foot as much as possible and wear the shoe/boot as instructed.  Cleanse your foot with saline wash or warm soapy water (dial antibacterial soap or similar).  Blot dry.  Apply prescribed medication to your wound and cover with gauze and a bandage.  May hold bandage in place with Coban (self sticky wrap), Ace bandage or tape.  You may find dressing supplies at your local [employer], [employer], drug store or medical supply store.  Your prescribed topical medication is :  Bactroban ointment , apply Bactroban ointment and clean dry sterile dressing daily after cleansing with warm water and dry and the wound thoroughly repeat dressings everyday  [employer] is a mail order medical supply company that we use to provide some of our would care products.  If we use their service of you, you will receive the product by mail.  If you have not received the medication in 3 business days, please call our office.  If you notice any foul odor, increase in pain, pus, increased swelling, red streaks or generalized redness occurring in your foot or leg-Call our office immediately to be seen.  This may be a sign of a limb or life threatening infection that will need prompt attention.  Harriet Masson, [place]  [facility]

## 2013-08-03 ENCOUNTER — Telehealth: Payer: Self-pay | Admitting: *Deleted

## 2013-08-03 NOTE — Telephone Encounter (Signed)
[facility] asked if pt was to have dressing changes with Silvadene or Bactoban.  I checked 08/02/2013 notes and pt's ulcer was redresed with Silvadene, and ordeers were written to dress daily with Bactroban.  Orders faxed.

## 2013-08-29 ENCOUNTER — Ambulatory Visit: Payer: Medicaid Other

## 2013-09-06 ENCOUNTER — Ambulatory Visit: Payer: Medicaid Other

## 2013-10-24 ENCOUNTER — Ambulatory Visit (INDEPENDENT_AMBULATORY_CARE_PROVIDER_SITE_OTHER): Payer: Medicaid Other

## 2013-10-24 VITALS — BP 119/62 | HR 76 | Resp 16

## 2013-10-24 DIAGNOSIS — E1142 Type 2 diabetes mellitus with diabetic polyneuropathy: Secondary | ICD-10-CM

## 2013-10-24 DIAGNOSIS — L97509 Non-pressure chronic ulcer of other part of unspecified foot with unspecified severity: Secondary | ICD-10-CM

## 2013-10-24 DIAGNOSIS — E1149 Type 2 diabetes mellitus with other diabetic neurological complication: Secondary | ICD-10-CM

## 2013-10-24 DIAGNOSIS — E1140 Type 2 diabetes mellitus with diabetic neuropathy, unspecified: Secondary | ICD-10-CM

## 2013-10-24 NOTE — Progress Notes (Signed)
   Subjective:    Patient ID: [name], female    DOB: 1951-03-29, 63 y.o.   MRN: [id number]  HPI This place on th ball  of my right foot hurts and hurts with a shoe and sore and tender and does drain some and the 3rd toe on my right is dark color and I don't remember stumping it and feels like it is in some hot coal     Review of Systems no new changes or findings    Objective:   Physical Exam Patient presents this time for followup ulcers last seen several months ago and lost to followup at this time presents no changes systemically no new findings on review of systems patient has pedal pulses palpable DP postal for bilateral PT one over 4 bilateral capillary refill timed 3-4 seconds. 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 Clinically there is mild digital contractures mild HAV deformity no x-rays taken at this visit.       Assessment & Plan:  Assessment ulceration secondary plantarflexed metatarsal and diabetes with peripheral neuropathy. At this time the ulcer site is debrided down to subcutaneous tissue level Silvadene and gauze dressing are applied patient will initiate daily cleansing and affect are some water using the process gauze dressings daily reappointed in 3 weeks for followup and  reevaluation patient just recently got diabetic shoes which have a Plastizote inlays are starting tomorrow barefoot advised to use these at all times were for dressing changes and wound care given at this time reevaluate in 3 weeks  Harriet Masson DPM

## 2013-10-24 NOTE — Patient Instructions (Signed)
ANTIBACTERIAL SOAP INSTRUCTIONS  THE DAY AFTER PROCEDURE  Please follow the instructions your doctor has marked.   Shower as usual. Before getting out, place a drop of antibacterial liquid soap (Dial) on a wet, clean washcloth.  Gently wipe washcloth over affected area.  Afterward, rinse the area with warm water.  Blot the area dry with a soft cloth and cover with antibiotic ointment (neosporin, polysporin, bacitracin) and band aid or gauze and tape  Place 3-4 drops of antibacterial liquid soap in a quart of warm tap water.  Submerge foot into water for 20 minutes.  If bandage was applied after your procedure, leave on to allow for easy lift off, then remove and continue with soak for the remaining time.  Next, blot area dry with a soft cloth and cover with a bandage.  Apply other medications as directed by your doctor apply me personally mid after saline or an abductor sub-cleansing every day applied presto dressing

## 2013-11-07 ENCOUNTER — Ambulatory Visit: Payer: Medicaid Other

## 2013-11-08 ENCOUNTER — Ambulatory Visit (INDEPENDENT_AMBULATORY_CARE_PROVIDER_SITE_OTHER): Payer: Medicaid Other

## 2013-11-08 VITALS — BP 130/73 | HR 69 | Temp 97.8°F | Resp 17 | Ht <= 58 in | Wt 156.0 lb

## 2013-11-08 DIAGNOSIS — L97509 Non-pressure chronic ulcer of other part of unspecified foot with unspecified severity: Secondary | ICD-10-CM

## 2013-11-08 DIAGNOSIS — E1140 Type 2 diabetes mellitus with diabetic neuropathy, unspecified: Secondary | ICD-10-CM

## 2013-11-08 DIAGNOSIS — E1149 Type 2 diabetes mellitus with other diabetic neurological complication: Secondary | ICD-10-CM

## 2013-11-08 DIAGNOSIS — E1142 Type 2 diabetes mellitus with diabetic polyneuropathy: Secondary | ICD-10-CM

## 2013-11-08 NOTE — Progress Notes (Signed)
   Subjective:    Patient ID: [name], female    DOB: 1951/02/17, 63 y.o.   MRN: [id number] Pt  States she's better, not even wearing a dressing. HPI    Review of Systems no new changes or findings     Objective:   Physical Exam Neurovascular status is intact and unchanged pedal pulses palpable Refill time 3 seconds bilateral the ulcer sub-1 right which is hemorrhage a keratoses however on debridement down to the dermal level the keratotic lesion is removed completely there is no residual ulcer beneath the dermal level the eschar is debrided away as any hemorrhage keratoses also debrided away at this time. No discharge drainage no ascending cellulitis no signs of any residual infection noted.       Assessment & Plan:  Assessment resolved ulceration down to dermal level debrided mildly down to dermal level only patient is discharged from our care Neosporin and Band-Aid applied . The ulcers result no signs of infection no discharge drainage the ulcer debrided down to dermal level only hemorrhage a keratotic tissue was removed no active infection no discharge noted patient discharge to an as-needed basis for future followup next  Harriet Masson DPM

## 2013-11-08 NOTE — Patient Instructions (Signed)
Diabetes and Foot Care Diabetes may cause you to have problems because of poor blood supply (circulation) to your feet and legs. This may cause the skin on your feet to become thinner, break easier, and heal more slowly. Your skin may become dry, and the skin may peel and crack. You may also have nerve damage in your legs and feet causing decreased feeling in them. You may not notice minor injuries to your feet that could lead to infections or more serious problems. Taking care of your feet is one of the most important things you can do for yourself.  HOME CARE INSTRUCTIONS  Wear shoes at all times, even in the house. Do not go barefoot. Bare feet are easily injured.  Check your feet daily for blisters, cuts, and redness. If you cannot see the bottom of your feet, use a mirror or ask someone for help.  Wash your feet with warm water (do not use hot water) and mild soap. Then pat your feet and the areas between your toes until they are completely dry. Do not soak your feet as this can dry your skin.  Apply a moisturizing lotion or petroleum jelly (that does not contain alcohol and is unscented) to the skin on your feet and to dry, brittle toenails. Do not apply lotion between your toes.  Trim your toenails straight across. Do not dig under them or around the cuticle. File the edges of your nails with an emery board or nail file.  Do not cut corns or calluses or try to remove them with medicine.  Wear clean socks or stockings every day. Make sure they are not too tight. Do not wear knee-high stockings since they may decrease blood flow to your legs.  Wear shoes that fit properly and have enough cushioning. To break in new shoes, wear them for just a few hours a day. This prevents you from injuring your feet. Always look in your shoes before you put them on to be sure there are no objects inside.  Do not cross your legs. This may decrease the blood flow to your feet.  If you find a minor scrape,  cut, or break in the skin on your feet, keep it and the skin around it clean and dry. These areas may be cleansed with mild soap and water. Do not cleanse the area with peroxide, alcohol, or iodine.  When you remove an adhesive bandage, be sure not to damage the skin around it.  If you have a wound, look at it several times a day to make sure it is healing.  Do not use heating pads or hot water bottles. They may burn your skin. If you have lost feeling in your feet or legs, you may not know it is happening until it is too late.  Make sure your health care provider performs a complete foot exam at least annually or more often if you have foot problems. Report any cuts, sores, or bruises to your health care provider immediately. SEEK MEDICAL CARE IF:   You have an injury that is not healing.  You have cuts or breaks in the skin.  You have an ingrown nail.  You notice redness on your legs or feet.  You feel burning or tingling in your legs or feet.  You have pain or cramps in your legs and feet.  Your legs or feet are numb.  Your feet always feel cold. SEEK IMMEDIATE MEDICAL CARE IF:   There is increasing redness,   swelling, or pain in or around a wound.  There is a red line that goes up your leg.  Pus is coming from a wound.  You develop a fever or as directed by your health care provider.  You notice a bad smell coming from an ulcer or wound. Document Released: 07/30/2000 Document Revised: 04/04/2013 Document Reviewed: 01/09/2013 [employer] Patient Information 2014 [employer].  

## 2014-02-12 ENCOUNTER — Ambulatory Visit (INDEPENDENT_AMBULATORY_CARE_PROVIDER_SITE_OTHER): Payer: Medicaid Other | Admitting: Podiatrist

## 2014-02-12 ENCOUNTER — Encounter: Payer: Self-pay | Admitting: Podiatrist

## 2014-02-12 VITALS — BP 141/77 | HR 81 | Temp 97.6°F | Resp 18

## 2014-02-12 DIAGNOSIS — E1149 Type 2 diabetes mellitus with other diabetic neurological complication: Secondary | ICD-10-CM

## 2014-02-12 DIAGNOSIS — L97509 Non-pressure chronic ulcer of other part of unspecified foot with unspecified severity: Secondary | ICD-10-CM

## 2014-02-12 MED ORDER — MUPIROCIN 2 % EX OINT: 1.0000 "application " | TOPICAL_OINTMENT | Freq: Two times a day (BID) | CUTANEOUS | Status: DC

## 2014-02-12 MED ORDER — CLINDAMYCIN HCL 300 MG PO CAPS: 300.0000 mg | ORAL_CAPSULE | Freq: Three times a day (TID) | ORAL | Status: DC

## 2014-02-12 NOTE — Progress Notes (Signed)
Chief Complaint  Patient presents with  . Foot Ulcer    BALL OF RIGHT FOOT AND IT HAS COME BACK     HPI: Patient is 63 y.o. female who presents today for ulceration that is returned submetatarsal one of the right foot. Patient relates pain and tenderness in the area. She also relates that the right leg is slightly more red than normal.     Physical Exam GENERAL APPEARANCE: Alert, conversant. Appropriately groomed. No acute distress.  VASCULAR: Pedal pulses palpable at 2/4 DP and PT bilateral.  Capillary refill time is immediate to all digits,  neurological sensation is decreased to the right foot. Prominent plantarflexed first metatarsal is noted. Well-circumscribed ulceration is present submetatarsal 1 with a central fibrotic core noted. It measures 3 mm in diameter and 2 mm in depth. No probing to bone is noted. No undermining is seen. No redness, no swelling, no pus is noted. Ulceration is present.  Assessment: Ulceration submetatarsal one right foot  Plan: Recommended resuming wound care orders of appearance and ointment and cleansing with soap and water. She will apply dressings daily. Also started her on clindamycin antibiotic. She'll be seen back for follow up with Dr. Blenda Mounts in 1-2 weeks' time.

## 2014-02-12 NOTE — Patient Instructions (Signed)
Instructions for Wound Care  The most important step to healing a foot wound is to reduce the pressure on your foot - it is extremely important to stay off your foot as much as possible while the wound is healing  Cleanse your foot with saline wash or warm soapy water (dial antibacterial soap or similar).  Blot dry.  Apply prescribed medication to your wound and cover with gauze and a bandage.  May hold bandage in place with Coban (self sticky wrap), Ace bandage or tape.  You may find dressing supplies at your local [employer], [employer], drug store or medical supply store.  Your prescribed topical medication is :  Mupirocin Ointment (twice daily)   If you notice any foul odor, increase in pain, pus, increased swelling, red streaks or generalized redness occurring in your foot or leg-Call our office immediately to be seen.  This may be a sign of a limb or life threatening infection that will need prompt attention.  Trudie Buckler, DPM

## 2014-02-14 ENCOUNTER — Ambulatory Visit: Payer: Medicaid Other

## 2014-02-21 ENCOUNTER — Ambulatory Visit (INDEPENDENT_AMBULATORY_CARE_PROVIDER_SITE_OTHER): Payer: Medicaid Other

## 2014-02-21 VITALS — BP 134/74 | HR 69 | Resp 12

## 2014-02-21 DIAGNOSIS — E1140 Type 2 diabetes mellitus with diabetic neuropathy, unspecified: Secondary | ICD-10-CM

## 2014-02-21 DIAGNOSIS — L97509 Non-pressure chronic ulcer of other part of unspecified foot with unspecified severity: Secondary | ICD-10-CM

## 2014-02-21 NOTE — Patient Instructions (Signed)
Orders for diabetic ulcer care   ANTIBACTERIAL SOAP INSTRUCTIONS  THE DAY AFTER PROCEDURE  Please follow the instructions your doctor has marked.   Shower as usual. Before getting out, place a drop of antibacterial liquid soap (Dial) on a wet, clean washcloth.  Gently wipe washcloth over affected area.  Afterward, rinse the area with warm water.  Blot the area dry with a soft cloth and cover with antibiotic ointment (neosporin, polysporin, bacitracin) and band aid or gauze and tape  Place 3-4 drops of antibacterial liquid soap in a quart of warm tap water.  Submerge foot into water for 20 minutes.  If bandage was applied after your procedure, leave on to allow for easy lift off, then remove and continue with soak for the remaining time.  Next, blot area dry with a soft cloth and cover with a bandage.  Apply other medications as directed by your doctor, such as cortisporin otic solution (eardrops) or neosporin antibiotic ointment  After washing the foot and drying thoroughly apply Silvadene and gauze dressing daily. Maintain daily dressing changes until ulcer has completely resolved    Harriet Masson DPM

## 2014-02-21 NOTE — Progress Notes (Signed)
   Subjective:    Patient ID: [name], female    DOB: March 04, 1951, 63 y.o.   MRN: [id number]  HPI   ''I THINK RT FOOT IS GETTING BETTER.''   Review of Systems no new findings or systemic changes noted     Objective:   Physical Exam Lower extremity objective findings as follows pedal pulses palpable unchanged DP +2/4 bilateral PT plus one over 4 bilateral capillary refill time 3 seconds all digits epicritic and proprioceptive sensations diminished on Semmes Weinstein testing there is keratotic lesion ulceration about 2 mm diameter sub-first MTP area right foot hemorrhage a keratoses and debrided away goes down to dermal level only this is a partial not full-thickness ulcer at this time greatly improved her resolving no signs of discharge or drainage noted at this time no ascending cellulitis lymphangitis nails otherwise unremarkable 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

## 2014-03-28 ENCOUNTER — Ambulatory Visit (INDEPENDENT_AMBULATORY_CARE_PROVIDER_SITE_OTHER): Payer: Medicaid Other

## 2014-03-28 VITALS — BP 137/81 | HR 72 | Resp 18

## 2014-03-28 DIAGNOSIS — L97509 Non-pressure chronic ulcer of other part of unspecified foot with unspecified severity: Secondary | ICD-10-CM

## 2014-03-28 DIAGNOSIS — E1140 Type 2 diabetes mellitus with diabetic neuropathy, unspecified: Secondary | ICD-10-CM

## 2014-03-28 DIAGNOSIS — M2011 Hallux valgus (acquired), right foot: Secondary | ICD-10-CM

## 2014-03-28 DIAGNOSIS — M2040 Other hammer toe(s) (acquired), unspecified foot: Secondary | ICD-10-CM

## 2014-03-28 NOTE — Progress Notes (Signed)
   Subjective:    Patient ID: [name], female    DOB: April 25, 1951, 63 y.o.   MRN: [id number]  HPI THE PLACE ON THE BALL OF MY RIGHT FOOT IS BETTER BUT IT STILL DRAINS    Review of Systems no new findings or systemic changes noted    Objective:   Physical Exam Lower extremity objective findings vascular status is intact DP +2/4 bilateral PT one over 4 bilateral capillary refill time 3 seconds all digits epicritic sensations diminished on Semmes Weinstein testing to forefoot digits and arch. There is normal plantar response DTRs not listed. Dermatologically skin color pigment normal hair growth absent nail somewhat criptotic. There is ulceration with macerated keratoses sub-first MTP area right the ulcers approximately 0.8 cm in diameter had been smaller however maceration undermining hasn't hurt in the ulcer somewhat enlarged down to subcutaneous tissue level patient indicates has not been draining as much however there is certainly some maceration and open ulcer still present the ulcer is debrided down to subcutaneous tissue level. No other open wounds no secondary infections no ascending psoas lymphangitis noted patient does have digital contractures bunion deformity with plantarflexed metatarsal her shoes are examined and noted to be a size 2 small her toes are pending always the end of her shoes extending to the end of her diabetic insoles. Insoles are modified with additional felt padding and a dancers pad pocket is created for the first metatarsal to offload the pressure on that area      Assessment & Plan:  Assessment diabetes with history of nerve peripheral neuropathy and ulceration sub-first right ulcer is debrided down to subcutaneous tissue level Silvadene and gauze dressing applied maintain Silvadene dressings daily wash with soap and water as instructed shoes and insoles modified at this time for it with additional pocketing for the first metatarsal recheck in one month for further  followup and reevaluation  Harriet Masson DPM

## 2014-03-28 NOTE — Patient Instructions (Signed)
ANTIBACTERIAL SOAP INSTRUCTIONS  THE DAY AFTER PROCEDURE  Please follow the instructions your doctor has marked.   Shower as usual. Before getting out, place a drop of antibacterial liquid soap (Dial) on a wet, clean washcloth.  Gently wipe washcloth over affected area.  Afterward, rinse the area with warm water.  Blot the area dry with a soft cloth and cover with antibiotic ointment (neosporin, polysporin, bacitracin) and band aid or gauze and tape  Place 3-4 drops of antibacterial liquid soap in a quart of warm tap water.  Submerge foot into water for 20 minutes.  If bandage was applied after your procedure, leave on to allow for easy lift off, then remove and continue with soak for the remaining time.  Next, blot area dry with a soft cloth and cover with a bandage.  Apply other medications as directed by your doctor, such as cortisporin otic solution (eardrops) or neosporin antibiotic ointment  Wash or so the foot in soap and water daily and dry as instructed. Apply Silvadene antibiotic ointment and a gauze dressing. Repeat dressing daily. Maintain diabetic shoes with pocket insoles at all times. Do not walk barefoot

## 2014-05-02 ENCOUNTER — Ambulatory Visit: Payer: Medicaid Other

## 2014-05-09 ENCOUNTER — Ambulatory Visit: Payer: Medicaid Other

## 2014-05-23 ENCOUNTER — Ambulatory Visit (INDEPENDENT_AMBULATORY_CARE_PROVIDER_SITE_OTHER): Payer: Medicaid Other

## 2014-05-23 VITALS — BP 154/76 | HR 86 | Resp 12

## 2014-05-23 DIAGNOSIS — M2011 Hallux valgus (acquired), right foot: Secondary | ICD-10-CM

## 2014-05-23 DIAGNOSIS — L97509 Non-pressure chronic ulcer of other part of unspecified foot with unspecified severity: Secondary | ICD-10-CM

## 2014-05-23 DIAGNOSIS — E1140 Type 2 diabetes mellitus with diabetic neuropathy, unspecified: Secondary | ICD-10-CM

## 2014-05-23 DIAGNOSIS — E08621 Diabetes mellitus due to underlying condition with foot ulcer: Secondary | ICD-10-CM

## 2014-05-23 DIAGNOSIS — M2040 Other hammer toe(s) (acquired), unspecified foot: Secondary | ICD-10-CM

## 2014-05-23 MED ORDER — CLINDAMYCIN HCL 300 MG PO CAPS: 300.0000 mg | ORAL_CAPSULE | Freq: Three times a day (TID) | ORAL | Status: DC

## 2014-05-23 NOTE — Patient Instructions (Signed)
ANTIBACTERIAL SOAP INSTRUCTIONS  THE DAY AFTER PROCEDURE  Please follow the instructions your doctor has marked.   Shower as usual. Before getting out, place a drop of antibacterial liquid soap (Dial) on a wet, clean washcloth.  Gently wipe washcloth over affected area.  Afterward, rinse the area with warm water.  Blot the area dry with a soft cloth and cover with antibiotic ointment (neosporin, polysporin, bacitracin) and band aid or gauze and tape  Place 3-4 drops of antibacterial liquid soap in a quart of warm tap water.  Submerge foot into water for 20 minutes.  If bandage was applied after your procedure, leave on to allow for easy lift off, then remove and continue with soak for the remaining time.  Next, blot area dry with a soft cloth and cover with a bandage.  Apply other medications as directed by your doctor, such as cortisporin otic solution (eardrops) or neosporin antibiotic ointment  Cleanse the wound and foot sites are ulcer site daily with soap and water as instructed applying antibiotic ointment Silvadene or Bactroban as instructed once daily and apply clean dry sterile dressing. Maintain daily dressing changes until ulcer resolves or closes.  Harriet Masson DPM 05/23/2014

## 2014-05-23 NOTE — Progress Notes (Addendum)
   Subjective:    Patient ID: [name], female    DOB: 09/24/1950, 63 y.o.   MRN: [id number]  HPI ''RT FOOT IS A LITTLE SORE AND HAVE DRAINIGE.''   Review of Systems no new findings or systemic changes noted    Objective:   Physical Exam 63 year old white female well-developed well-nourished oriented x3 presents at this time with a new or recurrence of ulcer sub-first MTP area right started bleeding or draining wound out of is addressing intact. Pain or discomfort noted next  Lower extremity objective findings as follows pedal pulses are palpable DP +2 PT plus one over 4 bilateral Refill time 3 seconds. Epicritic sensations grossly diminished on testing to the forefoot arch and digits. There is normal plantar response DTRs not listed dermatologic the skin color pigment normal hair growth absent nails somewhat criptotic there is ulceration with hemorrhage a keratosis about a centimeter and a half in diameter however upon debridement of the overlying keratoses resection on 3-4 mm ulcer sub-first MTP area right. Goes down to subcutaneous tissue level with full thickness ulceration noted in hemorrhage a keratosis around the periphery. 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      Assessment & Plan:  Assessment diabetes history peripheral neuropathy atrophy of fat pad with ulceration sub-first right is real ulcerations debrided down to subcutaneous tissue level Silvadene and gauze dressing are applied and will continue with Silvadene or Bactroban and gauze dressings daily as instructed as a precaution patient placed back on a regimen of antibiotic clindamycin 3 mg 3 times a day x10 days. Patient will be recheck in 3-4  weeks for followup and reevaluation maintain a coming shoes maintain dressing changes as instructed  Harriet Masson DPM

## 2014-06-13 ENCOUNTER — Ambulatory Visit (INDEPENDENT_AMBULATORY_CARE_PROVIDER_SITE_OTHER): Payer: Medicaid Other

## 2014-06-13 VITALS — BP 155/78 | HR 80 | Resp 12

## 2014-06-13 DIAGNOSIS — E1140 Type 2 diabetes mellitus with diabetic neuropathy, unspecified: Secondary | ICD-10-CM

## 2014-06-13 DIAGNOSIS — L97509 Non-pressure chronic ulcer of other part of unspecified foot with unspecified severity: Secondary | ICD-10-CM

## 2014-06-13 DIAGNOSIS — M2011 Hallux valgus (acquired), right foot: Secondary | ICD-10-CM

## 2014-06-13 DIAGNOSIS — M2040 Other hammer toe(s) (acquired), unspecified foot: Secondary | ICD-10-CM

## 2014-06-13 DIAGNOSIS — E08621 Diabetes mellitus due to underlying condition with foot ulcer: Secondary | ICD-10-CM

## 2014-06-13 NOTE — Progress Notes (Signed)
   Subjective:    Patient ID: [name], female    DOB: 09-11-1950, 63 y.o.   MRN: [id number]  HPI ''RT FOOT IS DOING OK AND TRIED NOT TO WALK A LOT ON IT.''   Review of Systems no new findings or systemic changes noted     Objective:   Physical Exam Neurovascular status unchanged patient presents for follow-up ulceration sub-first MTP area right profound gross diabetic neuropathy noted. Ulcer reveals hemorrhagic keratosis about 3 mm in diameter which on debridement reveals intact epithelialized skin down to the dermal level there is no discharge drainage no signs of infection no edema or erythema. 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 Ulcer resolved at this time no signs of infection noted may continue with cream or lotion to the foot daily to help with dry skin       Assessment & Plan:  Assessment diabetes with peripheral neuropathy and angiopathy atrophy of the fat pad ulceration sub-1 right is resolved there is good new dermal epithelialization identified maintain any cream or lotion carry lotion use her in her Vaseline intensive care lotion to the foot daily for prevention of dry spur cracked skin reappointment 2-3 months for diabetic foot palliative nail care in the future as needed patient is completed antibiotic regimen again recheck in 2 months for follow-up and diabetic footcare next contact us immediately if there is any re-exacerbation of issues  Harriet Masson DPM

## 2014-08-02 ENCOUNTER — Ambulatory Visit (INDEPENDENT_AMBULATORY_CARE_PROVIDER_SITE_OTHER): Payer: Medicaid Other

## 2014-08-02 VITALS — BP 132/70 | HR 84 | Temp 97.5°F | Resp 12

## 2014-08-02 DIAGNOSIS — E1140 Type 2 diabetes mellitus with diabetic neuropathy, unspecified: Secondary | ICD-10-CM

## 2014-08-02 DIAGNOSIS — L02619 Cutaneous abscess of unspecified foot: Secondary | ICD-10-CM

## 2014-08-02 DIAGNOSIS — E08621 Diabetes mellitus due to underlying condition with foot ulcer: Secondary | ICD-10-CM

## 2014-08-02 DIAGNOSIS — L97509 Non-pressure chronic ulcer of other part of unspecified foot with unspecified severity: Secondary | ICD-10-CM

## 2014-08-02 DIAGNOSIS — L03119 Cellulitis of unspecified part of limb: Secondary | ICD-10-CM

## 2014-08-02 MED ORDER — CLINDAMYCIN HCL 300 MG PO CAPS: 300.0000 mg | ORAL_CAPSULE | Freq: Three times a day (TID) | ORAL | Status: DC

## 2014-08-02 MED ORDER — MUPIROCIN 2 % EX OINT: TOPICAL_OINTMENT | CUTANEOUS | Status: DC

## 2014-08-02 NOTE — Progress Notes (Signed)
   Subjective:    Patient ID: [name], female    DOB: [birth year], 64 y.o.   MRN: [id number]  HPI  ''RT FOOT IS SORE, DRAINING, AND HAVE ODOR.''  Review of Systems no new findings or systemic changes are noted     Objective:   Physical Exam 63 year old female since this time with caregiver from [place] facility. Patient is pain and discomfort and swelling and drainage from right great toe joint area been going on for a possibly a week at this point. Its been draining painful and red with malodor. Lower extremity objective findings as follows vascular status appears to be intact although diminished pedal pulses DP and PT 1 over 4 best bilateral there is a blistered skin lesion with underlying severe erythema and localized cellulitis of the skin following D roofing of the blister which is approximately 3 cm in diameter at the first MTP joint. There is malodor and mild serosanguineous drainage identified no sinus tract going down deep to bone or capsule this goes down to the dermal junction only thickness ulceration or blistering is identified painful and tender to touch warm to the touch with localized cellulitis no ascending cellulitis no lymphangitis noted patient's temperature 97.5 has been afebrile. Remainder the exam reveals digital contractures does have diabetes complications notable HAV deformity and hammertoe deformities as had ulceration previously over stable not wearing her diabetic shoes indicates she thinks her shoes made caused this however we'll inspect the shoes in the future date currently wearing a pair of casual type shoes.       Assessment & Plan:  Assessment this time is diabetes history peripheral neuropathy and angiopathy there is ulceration or blistering first MTP area about 3 cm superficial blister with some hemorrhage a keratoses and underlying cellulitis is identified the area is cleansed at this time Iodosorb and gauze dressing are applied patient is given  instructions and daily cleansing with antibacterial soap and warm water prescription for new process and and a prescription for clindamycin or issued at this time. Written instructions for wound care and antibiotic the ministrations given patient be recheck in 2 weeks as scheduled appointment for palliative nail care and follow-up ulcer check at that time. Patient advised to immediately go the emergency room if any fever chills were to occur.  Harriet Masson DPM

## 2014-08-02 NOTE — Patient Instructions (Signed)
ANTIBACTERIAL SOAP INSTRUCTIONS  THE DAY AFTER PROCEDURE  Please follow the instructions your doctor has marked.   Shower as usual. Before getting out, place a drop of antibacterial liquid soap (Dial) on a wet, clean washcloth.  Gently wipe washcloth over affected area.  Afterward, rinse the area with warm water.  Blot the area dry with a soft cloth and cover with antibiotic ointment (neosporin, polysporin, bacitracin) and band aid or gauze and tape  Place 3-4 drops of antibacterial liquid soap in a quart of warm tap water.  Submerge foot into water for 20 minutes.  If bandage was applied after your procedure, leave on to allow for easy lift off, then remove and continue with soak for the remaining time.  Next, blot area dry with a soft cloth and cover with a bandage.  Apply other medications as directed by your doctor, such as cortisporin otic solution (eardrops) or neosporin antibiotic ointment  Wash the ulcer site daily with antibacterial soap and water as instructed dry thoroughly apply mupirocin antibiotic ointment and gauze dressing daily as instructed  Prescription for clindamycin 300 mg was issued. Take 1 tablet 3 times a day for 10 days as instructed.  Contact us immediately or go to the emergency room immediately if any fever or chills or any red streaking from the foot extends up into the leg.  Harriet Masson DPM 08/02/14

## 2014-08-12 ENCOUNTER — Ambulatory Visit (INDEPENDENT_AMBULATORY_CARE_PROVIDER_SITE_OTHER): Payer: Medicaid Other

## 2014-08-12 VITALS — BP 135/64 | HR 74 | Resp 12

## 2014-08-12 DIAGNOSIS — E1140 Type 2 diabetes mellitus with diabetic neuropathy, unspecified: Secondary | ICD-10-CM

## 2014-08-12 DIAGNOSIS — L97509 Non-pressure chronic ulcer of other part of unspecified foot with unspecified severity: Secondary | ICD-10-CM

## 2014-08-12 DIAGNOSIS — E08621 Diabetes mellitus due to underlying condition with foot ulcer: Secondary | ICD-10-CM

## 2014-08-12 DIAGNOSIS — M79676 Pain in unspecified toe(s): Secondary | ICD-10-CM

## 2014-08-12 DIAGNOSIS — B351 Tinea unguium: Secondary | ICD-10-CM

## 2014-08-12 NOTE — Progress Notes (Signed)
   Subjective:    Patient ID: [name], female    DOB: [birth year], 63 y.o.   MRN: [id number]  HPI  RT FOOT IS [facility] BETTER AND NEED TOENAILS TRIM. Review of Systems no new findings or systemic changes noted     Objective:   Physical Exam Neurovascular status is unchanged pedal pulses DP +2 over 4 bilateral PT thready at best bilateral +1 edema noted bilateral. Patient does have notable bunion lateral deviation of left great toe with left hallux overlapping second digit no open wound or ulcer noted although patient does have some irritation to the toe asked about having surgery to fix that do not feel she's a good strong candidate for the surgery that would be required. And has thready pulse is history of ulceration with the assistance of a walker and is relatively unstable on her gait. If she continues to have any issues would consider possible possible left hallux amputation as an option. At this time to foam pads and bunion shields are dispensed for her left great toe joint. The ulcer site of the right great toe joint has completely resolved eschar tissue is debrided away at this time and there is no open wound good new epithelialization of the skin at the first MTP medial and plantar is identified no open wounds no ulcers no discharge or drainage no secondary infection is noted nails criptotic incurvated brittle friable 2 through 5 right 1 through 5 left painful dystrophic frontal mycotic nails are identified.       Assessment & Plan:  Assessment this time resolving ulceration with history of angiopathy and peripheral neuropathy affecting first MTP joint right the ulcer is completely closed and resolved at this time may discontinue care treatment maintain good shoes and stockings at all times. The left bunion is protected with a to foam pad bunion shield will provide additional bunion shields in the future as needed. At this time patient is completed her antibiotic regimens and  treatment of the nails are debrided 9 at this time return in 2-3 months for follow-up and continued palliative care in the future as needed  Harriet Masson DPM

## 2014-08-12 NOTE — Patient Instructions (Signed)
Diabetes and Foot Care Diabetes may cause you to have problems because of poor blood supply (circulation) to your feet and legs. This may cause the skin on your feet to become thinner, break easier, and heal more slowly. Your skin may become dry, and the skin may peel and crack. You may also have nerve damage in your legs and feet causing decreased feeling in them. You may not notice minor injuries to your feet that could lead to infections or more serious problems. Taking care of your feet is one of the most important things you can do for yourself.  HOME CARE INSTRUCTIONS  Wear shoes at all times, even in the house. Do not go barefoot. Bare feet are easily injured.  Check your feet daily for blisters, cuts, and redness. If you cannot see the bottom of your feet, use a mirror or ask someone for help.  Wash your feet with warm water (do not use hot water) and mild soap. Then pat your feet and the areas between your toes until they are completely dry. Do not soak your feet as this can dry your skin.  Apply a moisturizing lotion or petroleum jelly (that does not contain alcohol and is unscented) to the skin on your feet and to dry, brittle toenails. Do not apply lotion between your toes.  Trim your toenails straight across. Do not dig under them or around the cuticle. File the edges of your nails with an emery board or nail file.  Do not cut corns or calluses or try to remove them with medicine.  Wear clean socks or stockings every day. Make sure they are not too tight. Do not wear knee-high stockings since they may decrease blood flow to your legs.  Wear shoes that fit properly and have enough cushioning. To break in new shoes, wear them for just a few hours a day. This prevents you from injuring your feet. Always look in your shoes before you put them on to be sure there are no objects inside.  Do not cross your legs. This may decrease the blood flow to your feet.  If you find a minor scrape,  cut, or break in the skin on your feet, keep it and the skin around it clean and dry. These areas may be cleansed with mild soap and water. Do not cleanse the area with peroxide, alcohol, or iodine.  When you remove an adhesive bandage, be sure not to damage the skin around it.  If you have a wound, look at it several times a day to make sure it is healing.  Do not use heating pads or hot water bottles. They may burn your skin. If you have lost feeling in your feet or legs, you may not know it is happening until it is too late.  Make sure your health care provider performs a complete foot exam at least annually or more often if you have foot problems. Report any cuts, sores, or bruises to your health care provider immediately. SEEK MEDICAL CARE IF:   You have an injury that is not healing.  You have cuts or breaks in the skin.  You have an ingrown nail.  You notice redness on your legs or feet.  You feel burning or tingling in your legs or feet.  You have pain or cramps in your legs and feet.  Your legs or feet are numb.  Your feet always feel cold. SEEK IMMEDIATE MEDICAL CARE IF:   There is increasing redness,   swelling, or pain in or around a wound.  There is a red line that goes up your leg.  Pus is coming from a wound.  You develop a fever or as directed by your health care provider.  You notice a bad smell coming from an ulcer or wound. Document Released: 07/30/2000 Document Revised: 04/04/2013 Document Reviewed: 01/09/2013 [employer] Patient Information 2015 [employer]. This information is not intended to replace advice given to you by your health care provider. Make sure you discuss any questions you have with your health care provider.  

## 2014-11-11 ENCOUNTER — Ambulatory Visit: Payer: Medicaid Other

## 2015-06-19 ENCOUNTER — Encounter: Payer: Self-pay | Admitting: Sports Medicine

## 2015-06-19 ENCOUNTER — Ambulatory Visit (INDEPENDENT_AMBULATORY_CARE_PROVIDER_SITE_OTHER): Payer: Medicaid Other | Admitting: Sports Medicine

## 2015-06-19 VITALS — BP 129/78 | HR 90 | Resp 17

## 2015-06-19 DIAGNOSIS — M79673 Pain in unspecified foot: Secondary | ICD-10-CM

## 2015-06-19 DIAGNOSIS — L97519 Non-pressure chronic ulcer of other part of right foot with unspecified severity: Principal | ICD-10-CM

## 2015-06-19 DIAGNOSIS — M79676 Pain in unspecified toe(s): Secondary | ICD-10-CM

## 2015-06-19 DIAGNOSIS — E11621 Type 2 diabetes mellitus with foot ulcer: Secondary | ICD-10-CM

## 2015-06-19 DIAGNOSIS — E1142 Type 2 diabetes mellitus with diabetic polyneuropathy: Secondary | ICD-10-CM | POA: Diagnosis not present

## 2015-06-19 DIAGNOSIS — L97501 Non-pressure chronic ulcer of other part of unspecified foot limited to breakdown of skin: Secondary | ICD-10-CM

## 2015-06-19 NOTE — Progress Notes (Signed)
Patient ID: [name], female   DOB: 1951-06-18, 64 y.o.   MRN: [id number] Subjective: [name] is a 64 y.o. female patient seen in office for evaluation of preulcerative callus of the Right foot. Patient has a history of diabetes and a blood glucose level today that was recorded by her living facility; Patient is assisted by nurse who states that her blood sugar #s have been good.  At the facility [facility]) they have been applying mepilex border to site; which was first noticed 2 weeks ago. Denies nausea/fever/vomiting/chills/night sweats/shortness of breath/pain. Patient has no other pedal complaints at this time.  Patient Active Problem List   Diagnosis Date Noted  . Physical deconditioning 07/01/2011  . Weakness 07/01/2011  . Diabetic foot ulcer ([place]) 05/19/2011  . Polypharmacy 02/09/2011  . BACK PAIN WITH RADICULOPATHY 05/25/2010  . DIABETES MELLITUS, TYPE I, UNCONTROLLED 08/18/2009  . DIASTOLIC HEART FAILURE, CHRONIC 04/25/2009  . ANEMIA 04/14/2009  . SLEEP APNEA 12/11/2008  . 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 DIABETIC PERIPHERAL NEUROPATHY 05/04/2007  . HYPERCHOLESTEROLEMIA 10/13/2006  . Gout, unspecified 10/13/2006  . HYPOKALEMIA 10/13/2006  . OBESITY, NOS 10/13/2006  . SCHIZOPHRENIA 10/13/2006  . DEPRESSIVE DISORDER, NOS 10/13/2006  . HYPERTENSION, BENIGN SYSTEMIC 10/13/2006  . VENOUS INSUFFICIENCY, CHRONIC 10/13/2006  . RHINITIS, ALLERGIC 10/13/2006  . ASTHMA, PERSISTENT 10/13/2006  . Reflux esophagitis 10/13/2006  . OSTEOARTHRITIS, MULTI SITES 10/13/2006  . INCONTINENCE, URGE 10/13/2006   Current Outpatient Prescriptions on File Prior to Visit  Medication Sig Dispense Refill  . albuterol (PROVENTIL HFA;VENTOLIN HFA) 108 (90 BASE) MCG/ACT inhaler Inhale 1 puff into the lungs every 4 (four) hours. 1 Inhaler 0  . alendronate (FOSAMAX) 70 MG tablet Take 70 mg by mouth every 7 (seven) days. Take with a full glass of water on an empty stomach.    [name] Kitchen allopurinol  (ZYLOPRIM) 300 MG tablet Take 300 mg by mouth 2 (two) times daily.     [name] Kitchen aluminum-magnesium hydroxide 200-200 MG/5ML suspension Take 20 mLs by mouth every 6 (six) hours as needed for indigestion.    [name] Kitchen amLODipine (NORVASC) 5 MG tablet Take 5 mg by mouth daily.    [name] Kitchen aspirin 81 MG tablet Take 81 mg by mouth daily.    [name] Kitchen atorvastatin (LIPITOR) 20 MG tablet Take 20 mg by mouth daily.    . cetirizine (ZYRTEC) 10 MG tablet Take 1 tablet (10 mg total) by mouth daily. 30 tablet 1  . Cholecalciferol 1000 UNITS tablet Take 1,000 Units by mouth daily.    . clindamycin (CLEOCIN) 300 MG capsule Take 1 capsule (300 mg total) by mouth 3 (three) times daily. 30 capsule 0  . cloNIDine (CATAPRES) 0.2 MG tablet Take 2 tablets (0.4 mg total) by mouth at bedtime. 60 tablet 6  . doxepin (SINEQUAN) 100 MG capsule Take 1 capsule (100 mg total) by mouth at bedtime. 30 capsule 0  . doxepin (SINEQUAN) 25 MG capsule Take 1 capsule (25 mg total) by mouth at bedtime. 30 capsule 0  . DULoxetine (CYMBALTA) 60 MG capsule Take 60 mg by mouth daily.    [name] Kitchen EPINEPHrine (EPI-PEN) 0.3 mg/0.3 mL DEVI Inject 0.3 mg into the muscle once.    . escitalopram (LEXAPRO) 10 MG tablet Take 1 tablet (10 mg total) by mouth daily. 30 tablet 12  . esomeprazole (NEXIUM) 40 MG capsule Take 40 mg by mouth daily before breakfast.    . ferrous sulfate 325 (65 FE) MG tablet Take 325 mg by mouth 3 (three) times daily.     [name] Kitchen  fluticasone (FLONASE) 50 MCG/ACT nasal spray Place 2 sprays into the nose daily.    . Fluticasone-Salmeterol (ADVAIR) 500-50 MCG/DOSE AEPB Inhale 1 puff into the lungs 2 (two) times daily.    . folic acid (FOLVITE) 1 MG tablet Take 1 mg by mouth daily. Take while on methotrexate    . furosemide (LASIX) 80 MG tablet Take 1 tablet (80 mg total) by mouth 2 (two) times daily. Needs to be seen before more refills. 60 tablet 0  . hydrochlorothiazide (HYDRODIURIL) 25 MG tablet TAKE 1 TABLET TWICE A DAY 30 tablet 0  . hydroxychloroquine  (PLAQUENIL) 200 MG tablet Take 200 mg by mouth 2 (two) times daily.     . insulin aspart (NOVOLOG) 100 UNIT/ML injection Inject 20-50 Units into the skin 3 (three) times daily before meals. 20 units before breakfast, 40 units at lunch & 50 units with evening meal    . Insulin Syringes, Disposable, U-100 0.5 ML MISC Use as directed 100 each 6  . ipratropium (ATROVENT HFA) 17 MCG/ACT inhaler Inhale 2 puffs into the lungs every 6 (six) hours.    . magnesium hydroxide (MILK OF MAGNESIA) 400 MG/5ML suspension Take 30 mLs by mouth daily as needed for mild constipation.    . metFORMIN (GLUCOPHAGE) 500 MG tablet Take 1 tablet (500 mg total) by mouth 2 (two) times daily. 60 tablet 0  . methotrexate 2.5 MG tablet Take 10 mg by mouth 2 (two) times a week. Per Dr. Estrella Deeds pills on Saturday & on sunday    . metoprolol tartrate (LOPRESSOR) 25 MG tablet Take 25 mg by mouth 2 (two) times daily.    . mupirocin ointment (BACTROBAN) 2 % Applied to the ulcer site with dressing change once daily after cleansing with soap and water 30 g 2  . OLANZapine (ZYPREXA) 20 MG tablet Take 20 mg by mouth at bedtime.    [name] Kitchen omeprazole (PRILOSEC) 20 MG capsule Take 20 mg by mouth daily.    [name] Kitchen oxybutynin (DITROPAN) 5 MG tablet Take 1 tablet (5 mg total) by mouth 2 (two) times daily. 60 tablet 0  . pantoprazole (PROTONIX) 40 MG tablet Take 1 tablet (40 mg total) by mouth 2 (two) times daily. 60 tablet 0  . polyethylene glycol (MIRALAX / GLYCOLAX) packet Take by mouth daily. Give two capsules as needed with water    . potassium chloride SA (K-DUR,KLOR-CON) 20 MEQ tablet Take 4 tablets (80 mEq total) by mouth 3 (three) times daily. 4 tablets PO in AM, 4 at noon and 4 QHS 360 tablet 6  . predniSONE (DELTASONE) 5 MG tablet Take 5 mg by mouth daily.     . pregabalin (LYRICA) 100 MG capsule Take 1 capsule (100 mg total) by mouth 3 (three) times daily. Patient needs an office visit 90 capsule 0  . ranitidine (ZANTAC) 300 MG capsule Take 1  capsule (300 mg total) by mouth every evening. 30 capsule 6  . simvastatin (ZOCOR) 10 MG tablet Take 10 mg by mouth daily.    [name] Kitchen spironolactone (ALDACTONE) 100 MG tablet Take 1 tablet (100 mg total) by mouth daily. 30 tablet 6  . traMADol-acetaminophen (ULTRACET) 37.5-325 MG per tablet Take 1 tablet by mouth every 6 (six) hours as needed for pain. 30 tablet 0  . traZODone (DESYREL) 100 MG tablet Take 100 mg by mouth at bedtime.     No current facility-administered medications on file prior to visit.   Allergies  Allergen Reactions  . Benztropine Mesylate  REACTION: Alopecia and white spots on eyes  . Codeine     REACTION: Mouth Swelling - Tachycardia  . Diazepam     REACTION: COMA  . Diphenhydramine Hcl     REACTION: Tachycardia , anxiousness  . Lisinopril     REACTION: cough  . Penicillins     REACTION: ulcers in mouth, swelling, throat swelling  . Sulfonamide Derivatives     REACTION: rash - blisters  . Thioridazine Hcl Other (See Comments)    REACTION: swelling    HBA1C- No recent lab on file   Objective: Vitals: Reviewed  General: Patient is awake, alert, oriented x 3 and in no acute distress. Wheelchair and O2 assisted.   Dermatology: Skin is warm and dry bilateral with a partial thickness ulceration present  Right foot sub met 1. Ulceration measures 0.5 cm x 0.3 cm. There is a  Keratotic/calloused border once parred revealed a granular base that is superifical. The ulceration does not probe to bone. There is no malodor, no active drainage, no erythema, no edema. No acute signs of infection.   Vascular: Dorsalis Pedis pulse = 1/4 Bilateral,  Posterior Tibial pulse = 1/4 Bilateral,  Capillary Fill Time < 5 seconds  Neurologic: Epicritic sensation absent to the level of ankle using the 5.07/10g [employer].  Musculosketal: There is decreased ankle joint range of motion Bilateral. There is decreased Subtalar joint range of motion Bilateral. There is a  decrease in 1st metatarsophalangeal joint range of motion Bilateral. Significant HAV and digital deformitiy bilateral. No Pain with palpation to ulcerated area. No pain with compression to calves bilateral.  Assessment and Plan:  Problem List Items Addressed This Visit      Other   Diabetic foot ulcer ([place]) - Primary    Other Visit Diagnoses    Diabetic polyneuropathy associated with type 2 diabetes mellitus ([place])        Foot pain, unspecified laterality          -Examined patient and discussed the progression of the wound and treatment alternatives. - Excisionally dedbrided ulceration to healthy bleeding borders using a sterile chisel blade. -Applied offloading pad, topical antibiotic, and dry sterile dressing and instructed patient to continue with once daily dressings with assistance of nursing at the facilityconsisting of topical antibiotic and bandaid/dry sterile dressing. - Advised patient to go to the ER or return to office if the wound worsens or if constitutional symptoms are present. -Once wound heals will order diabetic shoes and inserts. -Patient to return to office in 2 weeks for follow up care and evaluation or sooner if problems arise. Will consider repeat xrays if no improvement for further evaluation.   Landis Martins, DPM

## 2015-07-03 ENCOUNTER — Ambulatory Visit: Payer: Medicaid Other | Admitting: Sports Medicine

## 2015-11-10 ENCOUNTER — Observation Stay (HOSPITAL_COMMUNITY)
Admission: EM | Admit: 2015-11-10 | Discharge: 2015-11-12 | Disposition: A | Discharge status: Skilled Nursing Facility | Payer: Medicaid Other | Attending: Student in an Organized Health Care Education/Training Program | Admitting: Student in an Organized Health Care Education/Training Program

## 2015-11-10 ENCOUNTER — Emergency Department (HOSPITAL_COMMUNITY): Payer: Medicaid Other

## 2015-11-10 ENCOUNTER — Encounter (HOSPITAL_COMMUNITY): Payer: Self-pay | Admitting: Emergency Medicine

## 2015-11-10 DIAGNOSIS — E1140 Type 2 diabetes mellitus with diabetic neuropathy, unspecified: Secondary | ICD-10-CM | POA: Insufficient documentation

## 2015-11-10 DIAGNOSIS — Z87891 Personal history of nicotine dependence: Secondary | ICD-10-CM | POA: Diagnosis not present

## 2015-11-10 DIAGNOSIS — E1142 Type 2 diabetes mellitus with diabetic polyneuropathy: Secondary | ICD-10-CM | POA: Insufficient documentation

## 2015-11-10 DIAGNOSIS — R4781 Slurred speech: Secondary | ICD-10-CM | POA: Insufficient documentation

## 2015-11-10 DIAGNOSIS — R531 Weakness: Principal | ICD-10-CM | POA: Insufficient documentation

## 2015-11-10 DIAGNOSIS — I5033 Acute on chronic diastolic (congestive) heart failure: Secondary | ICD-10-CM | POA: Diagnosis present

## 2015-11-10 DIAGNOSIS — N189 Chronic kidney disease, unspecified: Secondary | ICD-10-CM | POA: Insufficient documentation

## 2015-11-10 DIAGNOSIS — J45909 Unspecified asthma, uncomplicated: Secondary | ICD-10-CM | POA: Insufficient documentation

## 2015-11-10 DIAGNOSIS — E876 Hypokalemia: Secondary | ICD-10-CM | POA: Diagnosis not present

## 2015-11-10 DIAGNOSIS — Z79899 Other long term (current) drug therapy: Secondary | ICD-10-CM | POA: Diagnosis not present

## 2015-11-10 DIAGNOSIS — R109 Unspecified abdominal pain: Secondary | ICD-10-CM

## 2015-11-10 DIAGNOSIS — N179 Acute kidney failure, unspecified: Secondary | ICD-10-CM | POA: Insufficient documentation

## 2015-11-10 DIAGNOSIS — Z993 Dependence on wheelchair: Secondary | ICD-10-CM | POA: Diagnosis not present

## 2015-11-10 DIAGNOSIS — I5032 Chronic diastolic (congestive) heart failure: Secondary | ICD-10-CM | POA: Diagnosis not present

## 2015-11-10 DIAGNOSIS — I639 Cerebral infarction, unspecified: Secondary | ICD-10-CM | POA: Diagnosis not present

## 2015-11-10 DIAGNOSIS — E785 Hyperlipidemia, unspecified: Secondary | ICD-10-CM | POA: Diagnosis not present

## 2015-11-10 DIAGNOSIS — E1122 Type 2 diabetes mellitus with diabetic chronic kidney disease: Secondary | ICD-10-CM | POA: Insufficient documentation

## 2015-11-10 DIAGNOSIS — E669 Obesity, unspecified: Secondary | ICD-10-CM | POA: Diagnosis not present

## 2015-11-10 DIAGNOSIS — Z86711 Personal history of pulmonary embolism: Secondary | ICD-10-CM | POA: Insufficient documentation

## 2015-11-10 DIAGNOSIS — F32A Depression, unspecified: Secondary | ICD-10-CM | POA: Diagnosis present

## 2015-11-10 DIAGNOSIS — F209 Schizophrenia, unspecified: Secondary | ICD-10-CM | POA: Diagnosis not present

## 2015-11-10 DIAGNOSIS — I872 Venous insufficiency (chronic) (peripheral): Secondary | ICD-10-CM | POA: Diagnosis present

## 2015-11-10 DIAGNOSIS — Z880 Allergy status to penicillin: Secondary | ICD-10-CM | POA: Diagnosis not present

## 2015-11-10 DIAGNOSIS — G4730 Sleep apnea, unspecified: Secondary | ICD-10-CM | POA: Diagnosis not present

## 2015-11-10 DIAGNOSIS — F329 Major depressive disorder, single episode, unspecified: Secondary | ICD-10-CM | POA: Insufficient documentation

## 2015-11-10 DIAGNOSIS — I10 Essential (primary) hypertension: Secondary | ICD-10-CM | POA: Diagnosis present

## 2015-11-10 DIAGNOSIS — Z794 Long term (current) use of insulin: Secondary | ICD-10-CM | POA: Insufficient documentation

## 2015-11-10 DIAGNOSIS — J449 Chronic obstructive pulmonary disease, unspecified: Secondary | ICD-10-CM | POA: Diagnosis not present

## 2015-11-10 LAB — CBC WITH DIFFERENTIAL/PLATELET
Basophils Absolute: 0 10*3/uL (ref 0.0–0.1)
Basophils Relative: 0 %
Eosinophils Absolute: 0.3 10*3/uL (ref 0.0–0.7)
Eosinophils Relative: 3 %
HCT: 30.8 % — ABNORMAL LOW (ref 36.0–46.0)
Hemoglobin: 10.4 g/dL — ABNORMAL LOW (ref 12.0–15.0)
Lymphocytes Relative: 20 %
Lymphs Abs: 1.9 10*3/uL (ref 0.7–4.0)
MCH: 32.2 pg (ref 26.0–34.0)
MCHC: 33.8 g/dL (ref 30.0–36.0)
MCV: 95.4 fL (ref 78.0–100.0)
Monocytes Absolute: 0.6 10*3/uL (ref 0.1–1.0)
Monocytes Relative: 6 %
Neutro Abs: 6.7 10*3/uL (ref 1.7–7.7)
Neutrophils Relative %: 71 %
Platelets: 191 10*3/uL (ref 150–400)
RBC: 3.23 MIL/uL — ABNORMAL LOW (ref 3.87–5.11)
RDW: 14 % (ref 11.5–15.5)
WBC: 9.5 10*3/uL (ref 4.0–10.5)

## 2015-11-10 LAB — I-STAT CHEM 8, ED
BUN: 34 mg/dL — ABNORMAL HIGH (ref 6–20)
Calcium, Ion: 1.01 mmol/L — ABNORMAL LOW (ref 1.13–1.30)
Chloride: 97 mmol/L — ABNORMAL LOW (ref 101–111)
Creatinine, Ser: 1.5 mg/dL — ABNORMAL HIGH (ref 0.44–1.00)
Glucose, Bld: 104 mg/dL — ABNORMAL HIGH (ref 65–99)
HCT: 31 % — ABNORMAL LOW (ref 36.0–46.0)
Hemoglobin: 10.5 g/dL — ABNORMAL LOW (ref 12.0–15.0)
Potassium: 3.5 mmol/L (ref 3.5–5.1)
Sodium: 140 mmol/L (ref 135–145)
TCO2: 32 mmol/L (ref 0–100)

## 2015-11-10 LAB — RAPID URINE DRUG SCREEN, HOSP PERFORMED
Amphetamines: NOT DETECTED
Barbiturates: NOT DETECTED
Benzodiazepines: NOT DETECTED
Cocaine: NOT DETECTED
Opiates: NOT DETECTED
Tetrahydrocannabinol: NOT DETECTED

## 2015-11-10 LAB — PROTIME-INR
INR: 1.07 (ref 0.00–1.49)
Prothrombin Time: 14.1 seconds (ref 11.6–15.2)

## 2015-11-10 LAB — URINALYSIS, ROUTINE W REFLEX MICROSCOPIC
Bilirubin Urine: NEGATIVE
Glucose, UA: NEGATIVE mg/dL
Hgb urine dipstick: NEGATIVE
Ketones, ur: NEGATIVE mg/dL
Leukocytes, UA: NEGATIVE
Nitrite: NEGATIVE
Protein, ur: 30 mg/dL — AB
Specific Gravity, Urine: 1.011 (ref 1.005–1.030)
pH: 6.5 (ref 5.0–8.0)

## 2015-11-10 LAB — COMPREHENSIVE METABOLIC PANEL
ALT: 18 U/L (ref 14–54)
AST: 23 U/L (ref 15–41)
Albumin: 3.3 g/dL — ABNORMAL LOW (ref 3.5–5.0)
Alkaline Phosphatase: 75 U/L (ref 38–126)
Anion gap: 12 (ref 5–15)
BUN: 27 mg/dL — ABNORMAL HIGH (ref 6–20)
CO2: 29 mmol/L (ref 22–32)
Calcium: 9.2 mg/dL (ref 8.9–10.3)
Chloride: 98 mmol/L — ABNORMAL LOW (ref 101–111)
Creatinine, Ser: 1.61 mg/dL — ABNORMAL HIGH (ref 0.44–1.00)
GFR calc Af Amer: 38 mL/min — ABNORMAL LOW (ref >60)
GFR calc non Af Amer: 33 mL/min — ABNORMAL LOW (ref >60)
Glucose, Bld: 107 mg/dL — ABNORMAL HIGH (ref 65–99)
Potassium: 3.5 mmol/L (ref 3.5–5.1)
Sodium: 139 mmol/L (ref 135–145)
Total Bilirubin: 0.1 mg/dL — ABNORMAL LOW (ref 0.3–1.2)
Total Protein: 6.5 g/dL (ref 6.5–8.1)

## 2015-11-10 LAB — URINE MICROSCOPIC-ADD ON

## 2015-11-10 LAB — ETHANOL: Alcohol, Ethyl (B): <5 mg/dL (ref <5)

## 2015-11-10 LAB — APTT: aPTT: 30 seconds (ref 24–37)

## 2015-11-10 LAB — CBG MONITORING, ED: Glucose-Capillary: 104 mg/dL — ABNORMAL HIGH (ref 65–99)

## 2015-11-10 LAB — I-STAT TROPONIN, ED: Troponin i, poc: 0 ng/mL (ref 0.00–0.08)

## 2015-11-10 MED ORDER — IOHEXOL 350 MG/ML SOLN
50.0000 mL | Freq: Once | INTRAVENOUS | Status: AC | PRN
  Administered 2015-11-10: 50 mL via INTRAVENOUS

## 2015-11-10 MED ORDER — SODIUM CHLORIDE 0.9 % IV BOLUS (SEPSIS)
1000.0000 mL | Freq: Once | INTRAVENOUS | Status: AC
  Administered 2015-11-10: 1000 mL via INTRAVENOUS

## 2015-11-10 NOTE — ED Provider Notes (Signed)
Sign out from [employer], [place].    Nursing home pt with h/o DM, hyperlipidemia, HTN, CHF, schizophrenia,  stroke sx starting around 1530 today including left sided weakness and slurred speech, evaluated [facility] Dr[facility]onel [facility]ay [facility]. 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  Requests medical admission for further eval.  Pt is from a nursing home in [place], pcp Dr. Jannette Fogo.   Results for orders placed or performed during the hospital encounter of 11/10/15  CBC with Differential  Result Value Ref Range   WBC 9.5 4.0 - 10.5 K/uL   RBC 3.23 (L) 3.87 - 5.11 MIL/uL   Hemoglobin 10.4 (L) 12.0 - 15.0 g/dL   HCT 30.8 (L) 36.0 - 46.0 %   MCV 95.4 78.0 - 100.0 fL   MCH 32.2 26.0 - 34.0 pg   MCHC 33.8 30.0 - 36.0 g/dL   RDW 14.0 11.5 - 15.5 %   Platelets 191 150 - 400 K/uL   Neutrophils Relative % 71 %   Neutro Abs 6.7 1.7 - 7.7 K/uL   Lymphocytes Relative 20 %   Lymphs Abs 1.9 0.7 - 4.0 K/uL   Monocytes Relative 6 %   Monocytes Absolute 0.6 0.1 - 1.0 K/uL   Eosinophils Relative 3 %   Eosinophils Absolute 0.3 0.0 - 0.7 K/uL   Basophils Relative 0 %   Basophils Absolute 0.0 0.0 - 0.1 K/uL  Comprehensive metabolic panel  Result Value Ref Range   Sodium 139 135 - 145 mmol/L   Potassium 3.5 3.5 - 5.1 mmol/L   Chloride 98 (L) 101 - 111 mmol/L   CO2 29 22 - 32 mmol/L   Glucose, Bld 107 (H) 65 - 99 mg/dL   BUN 27 (H) 6 - 20 mg/dL   Creatinine, Ser 1.61 (H) 0.44 - 1.00 mg/dL   Calcium 9.2 8.9 - 10.3 mg/dL   Total Protein 6.5 6.5 - 8.1 g/dL   Albumin 3.3 (L) 3.5 - 5.0 g/dL   AST 23 15 - 41 U/L   ALT 18 14 - 54 U/L   Alkaline Phosphatase 75 38 - 126 U/L   Total Bilirubin 0.1 (L) 0.3 - 1.2 mg/dL   GFR calc non Af Amer 33 (L) >60 mL/min   GFR calc Af Amer 38 (L) >60 mL/min   Anion gap 12 5 - 15  Ethanol  Result Value Ref Range   Alcohol, Ethyl (B) <5 <5 mg/dL  Protime-INR  Result Value Ref Range   Prothrombin Time 14.1 11.6 - 15.2 seconds   INR 1.07 0.00 - 1.49  APTT  Result  Value Ref Range   aPTT 30 24 - 37 seconds  I-Stat Chem 8, ED  (not at [facility], [facility])  Result Value Ref Range   Sodium 140 135 - 145 mmol/L   Potassium 3.5 3.5 - 5.1 mmol/L   Chloride 97 (L) 101 - 111 mmol/L   BUN 34 (H) 6 - 20 mg/dL   Creatinine, Ser 1.50 (H) 0.44 - 1.00 mg/dL   Glucose, Bld 104 (H) 65 - 99 mg/dL   Calcium, Ion 1.01 (L) 1.13 - 1.30 mmol/L   TCO2 32 0 - 100 mmol/L   Hemoglobin 10.5 (L) 12.0 - 15.0 g/dL   HCT 31.0 (L) 36.0 - 46.0 %  I-stat troponin, ED (not at [facility], [facility])  Result Value Ref Range   Troponin i, poc 0.00 0.00 - 0.08 ng/mL   Comment 3           Ct Angio Head W/cm &/  or Wo Cm  11/10/2015  CLINICAL DATA:  Code stroke left-sided deficits.  Slurred speech. EXAM: CT ANGIOGRAPHY HEAD AND NECK TECHNIQUE: Multidetector CT imaging of the head and neck was performed using the standard protocol during bolus administration of intravenous contrast. Multiplanar CT image reconstructions and MIPs were obtained to evaluate the vascular anatomy. Carotid stenosis measurements (when applicable) are obtained utilizing NASCET criteria, using the distal internal carotid diameter as the denominator. CONTRAST:  19mL OMNIPAQUE IOHEXOL 350 MG/ML SOLN COMPARISON:  Head MRI/ MRA 04/05/2010. Carotid Doppler ultrasound 03/30/2013. FINDINGS: CTA NECK Aortic arch: 3 vessel aortic arch. Brachiocephalic and subclavian arteries are patent without stenosis. Right carotid system: Patent without evidence of stenosis, dissection, or significant atherosclerosis. Left carotid system: Patent with minimal non stenotic calcified plaque at the carotid bifurcation. Vertebral arteries: Patent and codominant without stenosis. Skeleton: No acute osseous abnormality identified. Other neck: Mild motion artifact in the lung apices with likely subsegmental atelectasis posteriorly. Patchy ground-glass opacities are partially visualized extending from the perihilar regions. CTA HEAD Anterior circulation: Internal carotid arteries  are patent from skullbase to carotid termini with mild non stenotic calcified plaque. ACAs and MCAs are patent without evidence of significant stenosis or major branch occlusion. No intracranial aneurysm is identified. Posterior circulation: Intracranial vertebral arteries are widely patent and codominant. Left PICA origin is patent. Right AICA and bilateral SCA origins are patent. Basilar artery is patent without stenosis. There is a fetal type origin of the right PCA. PCAs are patent without evidence of significant stenosis. Venous sinuses: Patent. Dominant left transverse and sigmoid sinuses. Anatomic variants: Fetal origin of the right PCA. IMPRESSION: 1. No evidence of large vessel occlusion. 2. Minimal intracranial and extracranial atherosclerosis without significant stenosis. 3. Partially visualized ground-glass opacities in the lungs, possibly mild edema. Correlate with any respiratory complaints and chest radiography. Preliminary findings were discussed in person with Dr. Leonel Ramsay on 11/10/2015 at 7:58 p.m. Electronically Signed   By: Logan Bores M.D.   On: 11/10/2015 20:20   Ct Head Wo Contrast  11/10/2015  CLINICAL DATA:  Altered mental status.  Slurred speech EXAM: CT HEAD WITHOUT CONTRAST TECHNIQUE: Contiguous axial images were obtained from the base of the skull through the vertex without intravenous contrast. COMPARISON:  04/16/2015 FINDINGS: There is no evidence of mass effect, midline shift, or extra-axial fluid collections. There is no evidence of a space-occupying lesion or intracranial hemorrhage. There is no evidence of a cortical-based area of acute infarction. There is generalized cerebral atrophy. There is periventricular white matter low attenuation likely secondary to microangiopathy. The ventricles and sulci are appropriate for the patient's age. The basal cisterns are patent. Visualized portions of the orbits are unremarkable. The visualized portions of the paranasal sinuses and  mastoid air cells are unremarkable. Cerebrovascular atherosclerotic calcifications are noted. The osseous structures are unremarkable. IMPRESSION: 1. No acute intracranial pathology. Electronically Signed   By: Kathreen Devoid   On: 11/10/2015 19:20   Ct Angio Neck W/cm &/or Wo/cm  11/10/2015  CLINICAL DATA:  Code stroke left-sided deficits.  Slurred speech. EXAM: CT ANGIOGRAPHY HEAD AND NECK TECHNIQUE: Multidetector CT imaging of the head and neck was performed using the standard protocol during bolus administration of intravenous contrast. Multiplanar CT image reconstructions and MIPs were obtained to evaluate the vascular anatomy. Carotid stenosis measurements (when applicable) are obtained utilizing NASCET criteria, using the distal internal carotid diameter as the denominator. CONTRAST:  72mL OMNIPAQUE IOHEXOL 350 MG/ML SOLN COMPARISON:  Head MRI/ MRA 04/05/2010. Carotid Doppler ultrasound  03/30/2013. FINDINGS: CTA NECK Aortic arch: 3 vessel aortic arch. Brachiocephalic and subclavian arteries are patent without stenosis. Right carotid system: Patent without evidence of stenosis, dissection, or significant atherosclerosis. Left carotid system: Patent with minimal non stenotic calcified plaque at the carotid bifurcation. Vertebral arteries: Patent and codominant without stenosis. Skeleton: No acute osseous abnormality identified. Other neck: Mild motion artifact in the lung apices with likely subsegmental atelectasis posteriorly. Patchy ground-glass opacities are partially visualized extending from the perihilar regions. CTA HEAD Anterior circulation: Internal carotid arteries are patent from skullbase to carotid termini with mild non stenotic calcified plaque. ACAs and MCAs are patent without evidence of significant stenosis or major branch occlusion. No intracranial aneurysm is identified. Posterior circulation: Intracranial vertebral arteries are widely patent and codominant. Left PICA origin is patent. Right  AICA and bilateral SCA origins are patent. Basilar artery is patent without stenosis. There is a fetal type origin of the right PCA. PCAs are patent without evidence of significant stenosis. Venous sinuses: Patent. Dominant left transverse and sigmoid sinuses. Anatomic variants: Fetal origin of the right PCA. IMPRESSION: 1. No evidence of large vessel occlusion. 2. Minimal intracranial and extracranial atherosclerosis without significant stenosis. 3. Partially visualized ground-glass opacities in the lungs, possibly mild edema. Correlate with any respiratory complaints and chest radiography. Preliminary findings were discussed in person with Dr. Leonel Ramsay on 11/10/2015 at 7:58 p.m. Electronically Signed   By: Logan Bores M.D.   On: 11/10/2015 20:20    Call placed to unassigned medicine for admission.  Evalee Jefferson, PA-C 11/10/15 [id number]  Deno Etienne, DO 11/10/15 [id number]

## 2015-11-10 NOTE — ED Notes (Signed)
EMS - Patient coming from [facility] in [place].  LKN today at 1530 with left sided deficits and weakness.  Patient given 1 Ativan per MD at 1645 per facility.  Numbness down the left side and mouth with EMS.  Pupils are reactive.  Patient unable to follow commands.  Slurred speech.

## 2015-11-10 NOTE — ED Provider Notes (Signed)
CSN: [id number]     Arrival date & time 11/10/15  1840 History   First MD Initiated Contact with Patient 11/10/15 1847     Chief Complaint  Patient presents with  . Transient Ischemic Attack   (Consider location/radiation/quality/duration/timing/severity/associated sxs/prior Treatment) The history is provided by the EMS personnel. No language interpreter was used.   Level V caveat [name] is a 65 year old female with a history of diabetes, hyperlipidemia, hypertension, schizophrenia, asthma, CHF, obesity, PE who presents via EMS from [facility] in [place]. Patient was last seen normal at 3:30 today when she began complaining of left-sided weakness. The doctor ordered Ativan an hour later. Patient has numbness down the left side and is unable to speak. At baseline she is apparently ambulatory and able to verbalize thoughts.    Past Medical History  Diagnosis Date  . Allergy   . Anxiety   . Depression   . Diabetes mellitus   . GERD (gastroesophageal reflux disease)   . Hyperlipidemia   . Hypertension   . Schizophrenia ([place])   . Polypharmacy   . Physical deconditioning   . Barrett esophagus     Barretts Esophagus - EGD 8/07. (Repeat 2-3 years)  . Nephrolithiasis     2008  . Asthma     PFT's (10/14/06)- mild obstructive airway dz, poor effort  . 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 CHF (congestive heart failure) (HCC)     EF 55% on echo (5/10), repeat ECHO (08/29/09) EF 0000000, Grade 1 Diastolic Dsfxn, Lt atrium mildly dilated, PA systolic pressure mildly elevated.  . Obesity   . PE (pulmonary embolism)     2010  . Gout   . Hypokalemia   . Fibromyalgia   . Vitamin D deficiency   . Steroid dependence   . Peripheral neuropathy ([place])   . Back pain   . Anemia    Past Surgical History  Procedure Laterality Date  . Colonoscopy      benign polyps (12/2000), repeat colonoscopy performed in 2007  .  Endometrial biopsy      10/03 benign columnar mucosa (limited material)  . Appendectomy    . Cholecystectomy    . Kidney surgery     Family History  Problem Relation Age of Onset  . Bone cancer Mother   . COPD Father    Social History  Substance Use Topics  . Smoking status: Former Smoker    Quit date: 06/23/1972  . Smokeless tobacco: Never Used  . Alcohol Use: No   OB History    No data available     Review of Systems  Unable to perform ROS: Mental status change      Allergies  Benztropine mesylate; Codeine; Diazepam; Diphenhydramine hcl; Lisinopril; Penicillins; Sulfonamide derivatives; and Thioridazine hcl  Home Medications   Prior to Admission medications   Medication Sig Start Date End Date Taking? Authorizing Provider  albuterol (PROVENTIL HFA;VENTOLIN HFA) 108 (90 BASE) MCG/ACT inhaler Inhale 1 puff into the lungs every 4 (four) hours. 05/08/12  Yes Jacquelyn A McGill, MD  ALPRAZolam (XANAX) 0.5 MG tablet Take 0.5 mg by mouth 2 (two) times daily as needed for anxiety.   Yes Historical Provider, MD  amLODipine (NORVASC) 10 MG tablet Take 10 mg by mouth daily.   Yes Historical Provider, MD  ARIPiprazole (ABILIFY) 15 MG tablet Take 15 mg by mouth daily.   Yes Historical Provider, MD  ARIPiprazole (ABILIFY) 15 MG tablet Take  15 mg by mouth daily.   Yes Historical Provider, MD  Ascorbic Acid (VITAMIN C) 100 MG tablet Take 100 mg by mouth 2 (two) times daily.   Yes Historical Provider, MD  aspirin 325 MG tablet Take 650 mg by mouth every 6 (six) hours as needed for mild pain.   Yes Historical Provider, MD  aspirin 81 MG tablet Take 81 mg by mouth daily.   Yes Historical Provider, MD  atorvastatin (LIPITOR) 20 MG tablet Take 20 mg by mouth daily.   Yes Historical Provider, MD  cloNIDine (CATAPRES) 0.1 MG tablet Take 0.1 mg by mouth every 4 (four) hours as needed. For gout   Yes Historical Provider, MD  divalproex (DEPAKOTE) 125 MG DR tablet Take 250 mg by mouth at bedtime.    Yes Historical Provider, MD  docusate sodium (COLACE) 100 MG capsule Take 100 mg by mouth 2 (two) times daily.   Yes Historical Provider, MD  EPINEPHrine (EPI-PEN) 0.3 mg/0.3 mL DEVI Inject 0.3 mg into the muscle once.   Yes Historical Provider, MD  furosemide (LASIX) 20 MG tablet Take 20 mg by mouth daily.   Yes Historical Provider, MD  gabapentin (NEURONTIN) 400 MG capsule Take 400 mg by mouth 3 (three) times daily.   Yes Historical Provider, MD  insulin glargine (LANTUS) 100 UNIT/ML injection Inject 20 Units into the skin at bedtime.   Yes Historical Provider, MD  ipratropium (ATROVENT) 0.02 % nebulizer solution Take 0.5 mg by nebulization 4 (four) times daily.   Yes Historical Provider, MD  lactulose (CHRONULAC) 10 GM/15ML solution Take 30 g by mouth daily as needed for mild constipation.   Yes Historical Provider, MD  magnesium oxide (MAG-OX) 400 MG tablet Take 400 mg by mouth daily.   Yes Historical Provider, MD  metFORMIN (GLUCOPHAGE) 500 MG tablet Take 1 tablet (500 mg total) by mouth 2 (two) times daily. 05/08/12  Yes Jacquelyn A McGill, MD  Multiple Vitamin (MULTIVITAMIN WITH MINERALS) TABS tablet Take 1 tablet by mouth daily.   Yes Historical Provider, MD  omeprazole (PRILOSEC) 20 MG capsule Take 20 mg by mouth 2 (two) times daily.    Yes Historical Provider, MD  potassium chloride (K-DUR) 10 MEQ tablet Take 10 mEq by mouth 2 (two) times daily.   Yes Historical Provider, MD  ranitidine (ZANTAC) 75 MG tablet Take 75 mg by mouth 2 (two) times daily.   Yes Historical Provider, MD  traMADol (ULTRAM) 50 MG tablet Take 50 mg by mouth every 6 (six) hours as needed for moderate pain.   Yes Historical Provider, MD  traZODone (DESYREL) 100 MG tablet Take 50 mg by mouth at bedtime.    Yes Historical Provider, MD  cetirizine (ZYRTEC) 10 MG tablet Take 1 tablet (10 mg total) by mouth daily. Patient not taking: Reported on 11/10/2015 05/08/12   Edison Pace McGill, MD  clindamycin (CLEOCIN) 300 MG  capsule Take 1 capsule (300 mg total) by mouth 3 (three) times daily. Patient not taking: Reported on 11/10/2015 08/02/14   Harriet Masson, DPM  cloNIDine (CATAPRES) 0.2 MG tablet Take 2 tablets (0.4 mg total) by mouth at bedtime. Patient not taking: Reported on 11/10/2015 12/30/11   Gregor Hams, MD  doxepin (SINEQUAN) 100 MG capsule Take 1 capsule (100 mg total) by mouth at bedtime. Patient not taking: Reported on 11/10/2015 05/08/12   Edison Pace McGill, MD  doxepin (SINEQUAN) 25 MG capsule Take 1 capsule (25 mg total) by mouth at bedtime. Patient not taking: Reported on  11/10/2015 05/08/12   Jacquelyn A McGill, MD  escitalopram (LEXAPRO) 10 MG tablet Take 1 tablet (10 mg total) by mouth daily. Patient not taking: Reported on 11/10/2015 01/05/12   Gregor Hams, MD  furosemide (LASIX) 80 MG tablet Take 1 tablet (80 mg total) by mouth 2 (two) times daily. Needs to be seen before more refills. Patient not taking: Reported on 11/10/2015 05/08/12   Edison Pace McGill, MD  hydrochlorothiazide (HYDRODIURIL) 25 MG tablet TAKE 1 TABLET TWICE A DAY Patient not taking: Reported on 11/10/2015 06/01/12   Edison Pace McGill, MD  Insulin Syringes, Disposable, U-100 0.5 ML MISC Use as directed Patient not taking: Reported on 11/10/2015 12/21/11   Gregor Hams, MD  mupirocin ointment (BACTROBAN) 2 % Applied to the ulcer site with dressing change once daily after cleansing with soap and water Patient not taking: Reported on 11/10/2015 08/02/14   Harriet Masson, DPM  oxybutynin (DITROPAN) 5 MG tablet Take 1 tablet (5 mg total) by mouth 2 (two) times daily. Patient not taking: Reported on 11/10/2015 05/08/12   Malachi Bonds A McGill, MD  pantoprazole (PROTONIX) 40 MG tablet Take 1 tablet (40 mg total) by mouth 2 (two) times daily. Patient not taking: Reported on 11/10/2015 05/08/12   Edison Pace McGill, MD  potassium chloride SA (K-DUR,KLOR-CON) 20 MEQ tablet Take 4 tablets (80 mEq total) by mouth 3 (three) times daily. 4 tablets PO  in AM, 4 at noon and 4 QHS Patient not taking: Reported on 11/10/2015 11/23/11   Gregor Hams, MD  pregabalin (LYRICA) 100 MG capsule Take 1 capsule (100 mg total) by mouth 3 (three) times daily. Patient needs an office visit Patient not taking: Reported on 11/10/2015 05/08/12   Edison Pace McGill, MD  ranitidine (ZANTAC) 300 MG capsule Take 1 capsule (300 mg total) by mouth every evening. Patient not taking: Reported on 11/10/2015 11/23/11   Gregor Hams, MD  spironolactone (ALDACTONE) 100 MG tablet Take 1 tablet (100 mg total) by mouth daily. Patient not taking: Reported on 11/10/2015 11/23/11   Gregor Hams, MD  traMADol-acetaminophen (ULTRACET) 37.5-325 MG per tablet Take 1 tablet by mouth every 6 (six) hours as needed for pain. Patient not taking: Reported on 11/10/2015 05/08/12   Jacquelyn A McGill, MD   BP 131/60 mmHg  Pulse 72  Temp(Src) 97.9 F (36.6 C) (Oral)  Resp 20  Ht 5\' 2"  (1.575 m)  Wt 91.6 kg  BMI 36.93 kg/m2  SpO2 98% Physical Exam  Constitutional: She is oriented to person, place, and time. She appears well-developed and well-nourished. No distress.  HENT:  Head: Normocephalic.  Eyes: Conjunctivae are normal.  Right pupil is PERRL greater than left. Unable to tolerate secretions.  Neck: Normal range of motion. Neck supple.  Cardiovascular: Normal rate, regular rhythm and normal heart sounds.   Regular rate and rhythm.  Pulmonary/Chest: Effort normal and breath sounds normal. No respiratory distress. She has no wheezes.    Abdominal: Soft. She exhibits no distension. There is no tenderness.  Obese.  Musculoskeletal: Normal range of motion. She exhibits no edema.  Neurological: She is alert and oriented to person, place, and time. GCS eye subscore is 4. GCS verbal subscore is 2. GCS motor subscore is 1.  Incoherent. GCS 7. Unable to perform motor function.   Skin: Skin is warm and dry.  Nursing note and vitals reviewed.   ED Course  Procedures (including critical care  time) Labs Review Labs Reviewed  MRSA PCR SCREENING -  Abnormal; Notable for the following:    MRSA by PCR POSITIVE (*)    All other components within normal limits  CBC WITH DIFFERENTIAL/PLATELET - Abnormal; Notable for the following:    RBC 3.23 (*)    Hemoglobin 10.4 (*)    HCT 30.8 (*)    All other components within normal limits  COMPREHENSIVE METABOLIC PANEL - Abnormal; Notable for the following:    Chloride 98 (*)    Glucose, Bld 107 (*)    BUN 27 (*)    Creatinine, Ser 1.61 (*)    Albumin 3.3 (*)    Total Bilirubin 0.1 (*)    GFR calc non Af Amer 33 (*)    GFR calc Af Amer 38 (*)    All other components within normal limits  URINALYSIS, ROUTINE W REFLEX MICROSCOPIC (NOT AT [facility]) - Abnormal; Notable for the following:    Protein, ur 30 (*)    All other components within normal limits  URINE MICROSCOPIC-ADD ON - Abnormal; Notable for the following:    Squamous Epithelial / LPF 0-5 (*)    Bacteria, UA RARE (*)    Casts HYALINE CASTS (*)    All other components within normal limits  LIPID PANEL - Abnormal; Notable for the following:    Triglycerides 192 (*)    HDL 35 (*)    All other components within normal limits  BASIC METABOLIC PANEL - Abnormal; Notable for the following:    Potassium 3.2 (*)    BUN 23 (*)    Creatinine, Ser 1.41 (*)    Calcium 8.7 (*)    GFR calc non Af Amer 38 (*)    GFR calc Af Amer 45 (*)    All other components within normal limits  I-STAT CHEM 8, ED - Abnormal; Notable for the following:    Chloride 97 (*)    BUN 34 (*)    Creatinine, Ser 1.50 (*)    Glucose, Bld 104 (*)    Calcium, Ion 1.01 (*)    Hemoglobin 10.5 (*)    HCT 31.0 (*)    All other components within normal limits  CBG MONITORING, ED - Abnormal; Notable for the following:    Glucose-Capillary 104 (*)    All other components within normal limits  ETHANOL  PROTIME-INR  APTT  URINE RAPID DRUG SCREEN, HOSP PERFORMED  GLUCOSE, CAPILLARY  GLUCOSE, CAPILLARY  HEMOGLOBIN  A1C  I-STAT TROPOININ, ED    Imaging Review Ct Angio Head W/cm &/or Wo Cm  11/10/2015  CLINICAL DATA:  Code stroke left-sided deficits.  Slurred speech. EXAM: CT ANGIOGRAPHY HEAD AND NECK TECHNIQUE: Multidetector CT imaging of the head and neck was performed using the standard protocol during bolus administration of intravenous contrast. Multiplanar CT image reconstructions and MIPs were obtained to evaluate the vascular anatomy. Carotid stenosis measurements (when applicable) are obtained utilizing NASCET criteria, using the distal internal carotid diameter as the denominator. CONTRAST:  50mL OMNIPAQUE IOHEXOL 350 MG/ML SOLN COMPARISON:  Head MRI/ MRA 04/05/2010. Carotid Doppler ultrasound 03/30/2013. FINDINGS: CTA NECK Aortic arch: 3 vessel aortic arch. Brachiocephalic and subclavian arteries are patent without stenosis. Right carotid system: Patent without evidence of stenosis, dissection, or significant atherosclerosis. Left carotid system: Patent with minimal non stenotic calcified plaque at the carotid bifurcation. Vertebral arteries: Patent and codominant without stenosis. Skeleton: No acute osseous abnormality identified. Other neck: Mild motion artifact in the lung apices with likely subsegmental atelectasis posteriorly. Patchy ground-glass opacities are partially visualized extending from the  perihilar regions. CTA HEAD Anterior circulation: Internal carotid arteries are patent from skullbase to carotid termini with mild non stenotic calcified plaque. ACAs and MCAs are patent without evidence of significant stenosis or major branch occlusion. No intracranial aneurysm is identified. Posterior circulation: Intracranial vertebral arteries are widely patent and codominant. Left PICA origin is patent. Right AICA and bilateral SCA origins are patent. Basilar artery is patent without stenosis. There is a fetal type origin of the right PCA. PCAs are patent without evidence of significant stenosis. Venous  sinuses: Patent. Dominant left transverse and sigmoid sinuses. Anatomic variants: Fetal origin of the right PCA. IMPRESSION: 1. No evidence of large vessel occlusion. 2. Minimal intracranial and extracranial atherosclerosis without significant stenosis. 3. Partially visualized ground-glass opacities in the lungs, possibly mild edema. Correlate with any respiratory complaints and chest radiography. Preliminary findings were discussed in person with Dr. Leonel Ramsay on 11/10/2015 at 7:58 p.m. Electronically Signed   By: Logan Bores M.D.   On: 11/10/2015 20:20   Ct Head Wo Contrast  11/10/2015  CLINICAL DATA:  Altered mental status.  Slurred speech EXAM: CT HEAD WITHOUT CONTRAST TECHNIQUE: Contiguous axial images were obtained from the base of the skull through the vertex without intravenous contrast. COMPARISON:  04/16/2015 FINDINGS: There is no evidence of mass effect, midline shift, or extra-axial fluid collections. There is no evidence of a space-occupying lesion or intracranial hemorrhage. There is no evidence of a cortical-based area of acute infarction. There is generalized cerebral atrophy. There is periventricular white matter low attenuation likely secondary to microangiopathy. The ventricles and sulci are appropriate for the patient's age. The basal cisterns are patent. Visualized portions of the orbits are unremarkable. The visualized portions of the paranasal sinuses and mastoid air cells are unremarkable. Cerebrovascular atherosclerotic calcifications are noted. The osseous structures are unremarkable. IMPRESSION: 1. No acute intracranial pathology. Electronically Signed   By: Kathreen Devoid   On: 11/10/2015 19:20   Ct Angio Neck W/cm &/or Wo/cm  11/10/2015  CLINICAL DATA:  Code stroke left-sided deficits.  Slurred speech. EXAM: CT ANGIOGRAPHY HEAD AND NECK TECHNIQUE: Multidetector CT imaging of the head and neck was performed using the standard protocol during bolus administration of intravenous  contrast. Multiplanar CT image reconstructions and MIPs were obtained to evaluate the vascular anatomy. Carotid stenosis measurements (when applicable) are obtained utilizing NASCET criteria, using the distal internal carotid diameter as the denominator. CONTRAST:  60mL OMNIPAQUE IOHEXOL 350 MG/ML SOLN COMPARISON:  Head MRI/ MRA 04/05/2010. Carotid Doppler ultrasound 03/30/2013. FINDINGS: CTA NECK Aortic arch: 3 vessel aortic arch. Brachiocephalic and subclavian arteries are patent without stenosis. Right carotid system: Patent without evidence of stenosis, dissection, or significant atherosclerosis. Left carotid system: Patent with minimal non stenotic calcified plaque at the carotid bifurcation. Vertebral arteries: Patent and codominant without stenosis. Skeleton: No acute osseous abnormality identified. Other neck: Mild motion artifact in the lung apices with likely subsegmental atelectasis posteriorly. Patchy ground-glass opacities are partially visualized extending from the perihilar regions. CTA HEAD Anterior circulation: Internal carotid arteries are patent from skullbase to carotid termini with mild non stenotic calcified plaque. ACAs and MCAs are patent without evidence of significant stenosis or major branch occlusion. No intracranial aneurysm is identified. Posterior circulation: Intracranial vertebral arteries are widely patent and codominant. Left PICA origin is patent. Right AICA and bilateral SCA origins are patent. Basilar artery is patent without stenosis. There is a fetal type origin of the right PCA. PCAs are patent without evidence of significant stenosis. Venous sinuses: Patent. Dominant  left transverse and sigmoid sinuses. Anatomic variants: Fetal origin of the right PCA. IMPRESSION: 1. No evidence of large vessel occlusion. 2. Minimal intracranial and extracranial atherosclerosis without significant stenosis. 3. Partially visualized ground-glass opacities in the lungs, possibly mild edema.  Correlate with any respiratory complaints and chest radiography. Preliminary findings were discussed in person with Dr. Leonel Ramsay on 11/10/2015 at 7:58 p.m. Electronically Signed   By: Logan Bores M.D.   On: 11/10/2015 20:20   Mr Brain Wo Contrast  11/11/2015  CLINICAL DATA:  Initial evaluation for left-sided hemi paresis, possible stroke. EXAM: MRI HEAD WITHOUT CONTRAST TECHNIQUE: Multiplanar, multiecho pulse sequences of the brain and surrounding structures were obtained without intravenous contrast. COMPARISON:  Prior study from 11/10/2015. FINDINGS: Cerebral volume within normal limits for patient age. Mild chronic small vessel ischemic disease present within the periventricular and deep white matter both cerebral hemispheres. Probable small remote infarct present within the medial right cerebellar hemisphere (series 9, image 20). No other areas of chronic infarction. No abnormal foci of restricted diffusion to suggest acute infarct. Gray-white matter differentiation well maintained. Major intracranial vascular flow voids preserved. No acute or chronic intracranial hemorrhage. No mass lesion, midline shift, or mass effect. No hydrocephalus. No extra-axial fluid collection. Major dural sinuses are grossly patent. Craniocervical junction within normal limits. Visualized upper cervical spine unremarkable. Pituitary gland within normal limits. No acute abnormality about the orbits. Mild mucosal thickening within the visualized paranasal sinuses. Retention cysts within the left max O sinus partially visualized. Small bilateral mastoid effusions, slightly larger on the right. Inner ear structures normal. Bone marrow signal intensity within normal limits. No scalp soft tissue abnormality. IMPRESSION: 1. No acute intracranial infarct or other process identified. 2. Probable small remote infarct within the medial right cerebellar hemisphere. 3. Mild chronic small vessel ischemic disease. Electronically Signed   By:  Jeannine Boga M.D.   On: 11/11/2015 04:15   I have personally reviewed and evaluated these images and lab results as part of my medical decision-making.   EKG Interpretation   Date/Time:  Monday November 10 2015 19:15:26 EDT Ventricular Rate:  83 PR Interval:  117 QRS Duration: 167 QT Interval:  427 QTC Calculation: 502 R Axis:   -39 Text Interpretation:  Ectopic atrial rhythm Atrial premature complex  Borderline short PR interval Right bundle branch block Left ventricular  hypertrophy No significant change since last tracing Confirmed by FLOYD  MD, Quillian Quince [id number]) on 11/10/2015 7:21:46 PM      MDM   Final diagnoses:  Cerebral infarction due to unspecified mechanism   Patient presents for possible stroke and was last seen normal around 3:30 today. Her neuro exam is completely abnormal to her baseline. She was given Ativan around 4:30. Code stroke was called. CT head shows no intracranial abnormality at this time. Airway in tact.  Dr. Leonel Ramsay with neurology at bedside.  He has ordered CTA of neck and head which are pending. I discussed this patient with Evalee Jefferson, PA-C who will follow patient after scans for admission.   Ottie Glazier, PA-C 11/11/15 [place], DO 11/11/15 1514

## 2015-11-10 NOTE — ED Notes (Signed)
Pt transported to CT by [facility]

## 2015-11-10 NOTE — Consult Note (Signed)
Neurology Consultation Reason for Consult: Left sided weakness Referring Physician: Tyrone Nine, D  CC: I don't feel good  History is obtained from:patient, [facility] staff.   HPI: [name] is a 65 y.o. female who was last known to be well around 2 pm at which point she was complaining of feeling "bad but had no focal findings. She then approached staff around 4:30 pm complianing of left sided symptoms which the staff thought was anxiety and she was given ativan. She then became slurred and EMS was called. She states that she might have been having symptoms as early as breakfast time, but this is unclear. A head CT was negative.    LKW: 2 pm tpa given?: no, out of window.  Premorbid modified rankin scale: 1   ROS: Unable to obtain due to altered mental status.   Past Medical History  Diagnosis Date  . Allergy   . Anxiety   . Depression   . Diabetes mellitus   . GERD (gastroesophageal reflux disease)   . Hyperlipidemia   . Hypertension   . Schizophrenia ([place])   . Polypharmacy   . Physical deconditioning   . Barrett esophagus     Barretts Esophagus - EGD 8/07. (Repeat 2-3 years)  . Nephrolithiasis     2008  . Asthma     PFT's (10/14/06)- mild obstructive airway dz, poor effort  . 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 CHF (congestive heart failure) (HCC)     EF 55% on echo (5/10), repeat ECHO (08/29/09) EF 0000000, Grade 1 Diastolic Dsfxn, Lt atrium mildly dilated, PA systolic pressure mildly elevated.  . Obesity   . PE (pulmonary embolism)     2010  . Gout   . Hypokalemia   . Fibromyalgia   . Vitamin D deficiency   . Steroid dependence   . Peripheral neuropathy ([place])   . Back pain   . Anemia      Family History  Problem Relation Age of Onset  . Bone cancer Mother   . COPD Father      Social History:  reports that she quit smoking about 43 years ago. She has never used smokeless tobacco. She reports  that she does not drink alcohol or use illicit drugs.   Exam: Current vital signs: BP 134/75 mmHg  Pulse 84  Temp(Src) 98.7 F (37.1 C) (Oral)  Resp 27  SpO2 96% Vital signs in last 24 hours: Temp:  [98.7 F (37.1 C)] 98.7 F (37.1 C) (03/27 1844) Pulse Rate:  [84] 84 (03/27 1845) Resp:  [20-27] 27 (03/27 1845) BP: (134-138)/(75-82) 134/75 mmHg (03/27 1845) SpO2:  [96 %-99 %] 96 % (03/27 1845)   Physical Exam  Constitutional: Appears well-developed and well-nourished.  Psych: Affect appropriate to situation Eyes: No scleral injection HENT: No OP obstrucion Head: Normocephalic.  Cardiovascular: Normal rate and regular rhythm.  Respiratory: Effort normal and breath sounds normal to anterior ascultation GI: Soft.  No distension. There is no tenderness.  Skin: WDI  Neuro: Mental Status: Patient is drowsy but does follow most commands and answers some questions. She is fluent when she does speak.  Cranial Nerves: II: Visual Fields are full. Pupils are equal, round, and reactive to light.   III,IV, VI: She has a right gaze preference and does not cross midline to left.  V: Facial sensation is decreased on left VII: Facial movement is decreased on left VIII: hearing is intact to voice X:  does not cooperate XII: does not protrude tongue/   Motor: Tone is normal. Bulk is normal. She lifts the right side well, but has a clear left hemiparesis.  Sensory: Sensation is absent in the left arm and leg.  Cerebellar: No clear ataxia on the right   I have reviewed labs in epic and the results pertinent to this consultation are: Borderline creatinine. At 1.5  I have reviewed the images obtained: CT head-negative, CTA head-no clear large vessel occlusion  Impression: 65 year old female with new left-sided hemiparesis, right gaze preference, left-sided hypoesthesia consistent with a new ischemic infarct. She is outside the IV TPA window and does not have a lesion on CT Angio her  that would be amenable to interventional radiology. At this time she will need to be admitted for further stroke workup/management and stroke team will continue to follow.  Her blood pressures are normotensive, given that she is having a stroke I would give a liter of normal saline at this time.  Recommendations: 1. HgbA1c, fasting lipid panel 2. MRI the brain without contrast, MRA head is not needed given CTA already performed 3. Frequent neuro checks 4. Echocardiogram 5. Carotid dopplers are not needed given CTA already performed 6. Prophylactic therapy-Antiplatelet med: Aspirin - dose 325mg  PO or 300mg  PR 7. Risk factor modification 8. Telemetry monitoring 9. PT consult, OT consult, Speech consult 10. please page stroke NP  Or  PA  Or MD  M-F from 8am -4 pm starting 3/28 as this patient will be followed by the stroke team at this point.   You can look them up on [email or web address]  Password [email or web address]   Roland Rack, MD [facility] [phone]  If 7pm- 7am, please page neurology on call as listed in [place].

## 2015-11-10 NOTE — ED Notes (Signed)
Attempted report 

## 2015-11-10 NOTE — ED Notes (Signed)
Pt is outside of stroke intervention window per MD Leonel Ramsay, will continue to monitor with q2 hour neuro checks.

## 2015-11-11 ENCOUNTER — Observation Stay (HOSPITAL_COMMUNITY): Payer: Medicaid Other

## 2015-11-11 ENCOUNTER — Encounter (HOSPITAL_COMMUNITY): Payer: Self-pay | Admitting: *Deleted

## 2015-11-11 DIAGNOSIS — I5032 Chronic diastolic (congestive) heart failure: Secondary | ICD-10-CM

## 2015-11-11 DIAGNOSIS — E1140 Type 2 diabetes mellitus with diabetic neuropathy, unspecified: Secondary | ICD-10-CM

## 2015-11-11 DIAGNOSIS — I10 Essential (primary) hypertension: Secondary | ICD-10-CM

## 2015-11-11 DIAGNOSIS — E1142 Type 2 diabetes mellitus with diabetic polyneuropathy: Secondary | ICD-10-CM

## 2015-11-11 DIAGNOSIS — N179 Acute kidney failure, unspecified: Secondary | ICD-10-CM | POA: Diagnosis not present

## 2015-11-11 DIAGNOSIS — Z993 Dependence on wheelchair: Secondary | ICD-10-CM

## 2015-11-11 DIAGNOSIS — F329 Major depressive disorder, single episode, unspecified: Secondary | ICD-10-CM

## 2015-11-11 DIAGNOSIS — R5381 Other malaise: Secondary | ICD-10-CM

## 2015-11-11 DIAGNOSIS — I110 Hypertensive heart disease with heart failure: Secondary | ICD-10-CM

## 2015-11-11 DIAGNOSIS — E785 Hyperlipidemia, unspecified: Secondary | ICD-10-CM

## 2015-11-11 DIAGNOSIS — Z7982 Long term (current) use of aspirin: Secondary | ICD-10-CM

## 2015-11-11 DIAGNOSIS — F209 Schizophrenia, unspecified: Secondary | ICD-10-CM

## 2015-11-11 DIAGNOSIS — M6281 Muscle weakness (generalized): Secondary | ICD-10-CM | POA: Diagnosis not present

## 2015-11-11 DIAGNOSIS — M4806 Spinal stenosis, lumbar region: Secondary | ICD-10-CM | POA: Diagnosis not present

## 2015-11-11 DIAGNOSIS — E669 Obesity, unspecified: Secondary | ICD-10-CM

## 2015-11-11 DIAGNOSIS — Z794 Long term (current) use of insulin: Secondary | ICD-10-CM

## 2015-11-11 DIAGNOSIS — Z79899 Other long term (current) drug therapy: Secondary | ICD-10-CM

## 2015-11-11 LAB — BASIC METABOLIC PANEL
Anion gap: 9 (ref 5–15)
BUN: 23 mg/dL — ABNORMAL HIGH (ref 6–20)
CO2: 30 mmol/L (ref 22–32)
Calcium: 8.7 mg/dL — ABNORMAL LOW (ref 8.9–10.3)
Chloride: 102 mmol/L (ref 101–111)
Creatinine, Ser: 1.41 mg/dL — ABNORMAL HIGH (ref 0.44–1.00)
GFR calc Af Amer: 45 mL/min — ABNORMAL LOW (ref >60)
GFR calc non Af Amer: 38 mL/min — ABNORMAL LOW (ref >60)
Glucose, Bld: 92 mg/dL (ref 65–99)
Potassium: 3.2 mmol/L — ABNORMAL LOW (ref 3.5–5.1)
Sodium: 141 mmol/L (ref 135–145)

## 2015-11-11 LAB — MRSA PCR SCREENING: MRSA by PCR: POSITIVE — AB

## 2015-11-11 LAB — LIPID PANEL
Cholesterol: 160 mg/dL (ref 0–200)
HDL: 35 mg/dL — ABNORMAL LOW (ref >40)
LDL Cholesterol: 87 mg/dL (ref 0–99)
Total CHOL/HDL Ratio: 4.6 RATIO
Triglycerides: 192 mg/dL — ABNORMAL HIGH (ref <150)
VLDL: 38 mg/dL (ref 0–40)

## 2015-11-11 LAB — GLUCOSE, CAPILLARY
Glucose-Capillary: 80 mg/dL (ref 65–99)
Glucose-Capillary: 89 mg/dL (ref 65–99)
Glucose-Capillary: 179 mg/dL — ABNORMAL HIGH (ref 65–99)

## 2015-11-11 MED ORDER — ATORVASTATIN CALCIUM 10 MG PO TABS
20.0000 mg | ORAL_TABLET | Freq: Every day | ORAL | Status: DC
  Administered 2015-11-11: 20 mg via ORAL
  Filled 2015-11-11: qty 2

## 2015-11-11 MED ORDER — TRAMADOL-ACETAMINOPHEN 37.5-325 MG PO TABS
1.0000 | ORAL_TABLET | Freq: Four times a day (QID) | ORAL | Status: DC | PRN
  Administered 2015-11-11 – 2015-11-12 (×3): 1 via ORAL
  Filled 2015-11-11 (×4): qty 1

## 2015-11-11 MED ORDER — ARIPIPRAZOLE 10 MG PO TABS
15.0000 mg | ORAL_TABLET | Freq: Every day | ORAL | Status: DC
  Administered 2015-11-11 – 2015-11-12 (×2): 15 mg via ORAL
  Filled 2015-11-11 (×2): qty 2

## 2015-11-11 MED ORDER — SODIUM CHLORIDE 0.9 % IV SOLN
INTRAVENOUS | Status: AC
  Administered 2015-11-11: 01:00:00 via INTRAVENOUS

## 2015-11-11 MED ORDER — ATORVASTATIN CALCIUM 10 MG PO TABS: 20.0000 mg | ORAL_TABLET | Freq: Every day | ORAL | Status: DC

## 2015-11-11 MED ORDER — ASPIRIN EC 81 MG PO TBEC
81.0000 mg | DELAYED_RELEASE_TABLET | Freq: Every day | ORAL | Status: DC
Start: 2015-11-12 — End: 2015-11-12
  Administered 2015-11-12: 81 mg via ORAL
  Filled 2015-11-11: qty 1

## 2015-11-11 MED ORDER — ASPIRIN 300 MG RE SUPP
300.0000 mg | Freq: Every day | RECTAL | Status: DC
  Administered 2015-11-11: 300 mg via RECTAL
  Filled 2015-11-11 (×2): qty 1

## 2015-11-11 MED ORDER — STROKE: EARLY STAGES OF RECOVERY BOOK
Freq: Once | Status: AC
  Administered 2015-11-11: 1

## 2015-11-11 MED ORDER — ESCITALOPRAM OXALATE 10 MG PO TABS: 10.0000 mg | ORAL_TABLET | Freq: Every day | ORAL | Status: DC

## 2015-11-11 MED ORDER — SENNOSIDES-DOCUSATE SODIUM 8.6-50 MG PO TABS: 1.0000 | ORAL_TABLET | Freq: Every evening | ORAL | Status: DC | PRN

## 2015-11-11 MED ORDER — ENOXAPARIN SODIUM 40 MG/0.4ML ~~LOC~~ SOLN
40.0000 mg | Freq: Every day | SUBCUTANEOUS | Status: DC
  Administered 2015-11-11 (×2): 40 mg via SUBCUTANEOUS
  Filled 2015-11-11 (×2): qty 0.4

## 2015-11-11 MED ORDER — INSULIN GLARGINE 100 UNIT/ML ~~LOC~~ SOLN
10.0000 [IU] | Freq: Every day | SUBCUTANEOUS | Status: DC
Start: 2015-11-11 — End: 2015-11-12
  Administered 2015-11-11: 10 [IU] via SUBCUTANEOUS
  Filled 2015-11-11 (×3): qty 0.1

## 2015-11-11 MED ORDER — PANTOPRAZOLE SODIUM 40 MG PO TBEC
40.0000 mg | DELAYED_RELEASE_TABLET | Freq: Two times a day (BID) | ORAL | Status: DC
  Administered 2015-11-11 – 2015-11-12 (×3): 40 mg via ORAL
  Filled 2015-11-11 (×3): qty 1

## 2015-11-11 MED ORDER — POTASSIUM CHLORIDE CRYS ER 20 MEQ PO TBCR
40.0000 meq | EXTENDED_RELEASE_TABLET | Freq: Once | ORAL | Status: AC
  Administered 2015-11-11: 40 meq via ORAL
  Filled 2015-11-11: qty 2

## 2015-11-11 NOTE — Care Management Note (Signed)
Case Management Note  Patient Details  Name: [name] MRN: [id number] Date of Birth: [birth year]  Subjective/Objective:     Patient admitted to r/o CVA. MRI results negative. Patient is from [facility] in [place].                Action/Plan: Awaiting PT/OT recs and medical readiness. CM following for discharge needs.   Expected Discharge Date:                  Expected Discharge Plan:     In-House Referral:     Discharge planning Services     Post Acute Care Choice:    Choice offered to:     DME Arranged:    DME Agency:     HH Arranged:    HH Agency:     Status of Service:  In process, will continue to follow  Medicare Important Message Given:    Date Medicare IM Given:    Medicare IM give by:    Date Additional Medicare IM Given:    Additional Medicare Important Message give by:     If discussed at [place] of Stay Meetings, dates discussed:    Additional Comments:  Pollie Friar, RN 11/11/2015, 1:38 PM

## 2015-11-11 NOTE — Progress Notes (Signed)
Per Dr. Benjamine Mola, there is no need for stroke workup at this time.   Ave Filter, RN

## 2015-11-11 NOTE — Progress Notes (Signed)
[facility] in [place] wants to be undated on patient's condition. [phone].

## 2015-11-11 NOTE — Progress Notes (Signed)
STROKE TEAM PROGRESS NOTE   HISTORY OF PRESENT ILLNESS [name] is a 65 y.o. female who was last known to be well around 2 pm 11/10/2015 at which point she was complaining of feeling "bad but had no focal findings. She then approached staff around 4:30 pm complianing of left sided symptoms which the staff thought was anxiety and she was given ativan. She then became slurred and EMS was called. She states that she might have been having symptoms as early as breakfast time, but this is unclear. A head CT was negative. Premorbid modified rankin scale: 1. Patient was not administered IV t-PA secondary to being out of the window. She was admitted for further evaluation and treatment.   SUBJECTIVE (INTERVAL HISTORY) No family is at the bedside.  Overall she feels her condition is stable. She reports weakness and pain all over the body. She also complain of left arm and leg numbness has been there for ever. Also complain of LBP associated with left leg pain and numbness. She stated that she at home most time wheelchair bound, still not able to walk well.    OBJECTIVE Temp:  [97.9 F (36.6 C)-98.7 F (37.1 C)] 97.9 F (36.6 C) (03/28 1100) Pulse Rate:  [70-84] 77 (03/28 1100) Cardiac Rhythm:  [-] Normal sinus rhythm;Bundle branch block (03/28 0700) Resp:  [16-27] 20 (03/28 1100) BP: (110-153)/(55-82) 137/66 mmHg (03/28 1100) SpO2:  [96 %-100 %] 100 % (03/28 1100) Weight:  [91.6 kg (201 lb 15.1 oz)] 91.6 kg (201 lb 15.1 oz) (03/28 0027)  CBC:   Recent Labs Lab 11/10/15 1905 11/10/15 1931  WBC 9.5  --   NEUTROABS 6.7  --   HGB 10.4* 10.5*  HCT 30.8* 31.0*  MCV 95.4  --   PLT 191  --     Basic Metabolic Panel:   Recent Labs Lab 11/10/15 1905 11/10/15 1931 11/11/15 0239  NA 139 140 141  K 3.5 3.5 3.2*  CL 98* 97* 102  CO2 29  --  30  GLUCOSE 107* 104* 92  BUN 27* 34* 23*  CREATININE 1.61* 1.50* 1.41*  CALCIUM 9.2  --  8.7*    Lipid Panel:     Component Value Date/Time    CHOL 160 11/11/2015 0239   TRIG 192* 11/11/2015 0239   HDL 35* 11/11/2015 0239   CHOLHDL 4.6 11/11/2015 0239   VLDL 38 11/11/2015 0239   LDLCALC 87 11/11/2015 0239   HgbA1c:  Lab Results  Component Value Date   HGBA1C 7.6* 06/28/2011   Urine Drug Screen:     Component Value Date/Time   LABOPIA NONE DETECTED 11/10/2015 2142   COCAINSCRNUR NONE DETECTED 11/10/2015 2142   LABBENZ NONE DETECTED 11/10/2015 2142   AMPHETMU NONE DETECTED 11/10/2015 2142   THCU NONE DETECTED 11/10/2015 2142   LABBARB NONE DETECTED 11/10/2015 2142      IMAGING I have personally reviewed the radiological images below and agree with the radiology interpretations.  Ct Head Wo Contrast 11/10/2015  1. No acute intracranial pathology.   Ct Angio Head & Neck W/cm &/or Wo/cm 11/10/2015  1. No evidence of large vessel occlusion. 2. Minimal intracranial and extracranial atherosclerosis without significant stenosis. 3. Partially visualized ground-glass opacities in the lungs, possibly mild edema. Correlate with any respiratory complaints and chest radiography.   Mr Brain Wo Contrast 11/11/2015  1. No acute intracranial infarct or other process identified. 2. Probable small remote infarct within the medial right cerebellar hemisphere. 3. Mild chronic small vessel ischemic  disease.   TTE - pending   Physical exam  Temp:  [97.9 F (36.6 C)-98.7 F (37.1 C)] 98.2 F (36.8 C) (03/28 1358) Pulse Rate:  [70-84] 76 (03/28 1358) Resp:  [16-27] 20 (03/28 1358) BP: (110-153)/(55-82) 152/72 mmHg (03/28 1358) SpO2:  [96 %-100 %] 100 % (03/28 1358) Weight:  [201 lb 15.1 oz (91.6 kg)] 201 lb 15.1 oz (91.6 kg) (03/28 0027)  General - obesity, well developed, in no apparent distress.  Ophthalmologic - Fundi not visualized due to noncooperation.  Cardiovascular - Regular rate and rhythm with no murmur.  Mental Status -  Level of arousal and orientation to place, and person were intact, but not to time. Language  including expression, naming, repetition, comprehension was assessed and found intact, but moderate dysarthria. Fund of Knowledge was assessed and was intact.  Cranial Nerves II - XII - II - Visual field intact OU. III, IV, VI - Extraocular movements intact. V - Facial sensation intact bilaterally. VII - Facial movement intact bilaterally. VIII - Hearing & vestibular intact bilaterally. X - Palate elevates symmetrically. XI - Chin turning & shoulder shrug intact bilaterally. XII - Tongue protrusion intact.  Motor Strength - The patient's strength was normal in all extremities except left LE 3+/5 due to pain and pronator drift was absent.  Bulk was normal and fasciculations were absent.   Motor Tone - Muscle tone was assessed at the neck and appendages and was normal.  Reflexes - The patient's reflexes were 1+ in all extremities and she had no pathological reflexes.  Sensory - Light touch, temperature/pinprick were assessed and were decreased on the left UE.    Coordination - The patient had normal movements in the hands with no ataxia or dysmetria.  Tremor was absent.  Gait and Station - not tested due to safety concerns.   ASSESSMENT/PLAN [name] is a 65 y.o. female with history of DM II, HLD, HTN, CHF, Schizophrenia, PE and Obesity  presenting not "feeling good" and slurred speech. She did not receive IV t-PA due to out of the window.   L arm numbness. No acute stroke. Not consistent with TIA.  Resultant  ? L sided numbness  MRI  No acute stroke  CTA head & neck  No large vessel stenosis. Ground glass opacities in lung, ? Edema.  2D Echo  pending   LDL 87  HgbA1c pending  Lovenox 40 mg sq daily for VTE prophylaxis Diet Carb Modified Fluid consistency:: Thin; Room service appropriate?: Yes  aspirin 81 mg daily prior to admission, now on aspirin 300 mg suppository daily. Recommend continue home dose of aspirin at discharge  Patient counseled to be compliant  with her antithrombotic medications  Ongoing aggressive stroke risk factor management  Therapy recommendations:  pending   Disposition:  pending  (from [facility])  Hypertension  Stable  Hyperlipidemia  Home meds:  lipitor 20 mg daily  LDL 87, goal < 100  resume statin as able to swallow  Continue statin at discharge  Diabetes  HgbA1c pending, goal < 7.0  Other Stroke Risk Factors  Former Cigarette smoker, quit smoking in 1973  Obesity, Body mass index is 36.93 kg/(m^2).   Hx PE  Other Active Problems  AKI  CHF  Polypharmacy  Schizophrenia  Deconditioning   Hospital day # 1  Neurology will sign off. Please call with questions. No neuro follow up needed at this time. Thanks for the consult.  Rosalin Hawking, MD PhD Stroke Neurology 11/11/2015  4:08 PM    To contact Stroke Continuity provider, please refer to [email or web address] After hours, contact General Neurology

## 2015-11-11 NOTE — Progress Notes (Signed)
Nutrition Brief Note  Patient identified on the Malnutrition Screening Tool (MST) Report  Wt Readings from Last 15 Encounters:  11/11/15 201 lb 15.1 oz (91.6 kg)  11/08/13 156 lb (70.761 kg)  05/08/12 194 lb 3.2 oz (88.089 kg)  07/07/11 200 lb 9.9 oz (91 kg)  06/30/11 198 lb 13.7 oz (90.2 kg)  06/24/11 192 lb 14.4 oz (87.499 kg)  05/31/11 204 lb (92.534 kg)  05/31/11 204 lb 9.6 oz (92.806 kg)  05/19/11 196 lb (88.905 kg)  02/03/11 189 lb (85.73 kg)  08/18/10 205 lb (92.987 kg)  07/06/10 204 lb 4.8 oz (92.67 kg)  05/25/10 215 lb 1 oz (97.552 kg)  12/17/09 217 lb 9.6 oz (98.703 kg)  12/12/09 225 lb 8 oz (102.286 kg)    Body mass index is 36.93 kg/(m^2). Patient meets criteria for Obesity based on current BMI. Per weight history, pt has gained weight in the past two years. Pt with diarrhea at time of visit, unable to assess at this time. RN reports that patient ate 100% of lunch. Hemoglobin A1c in process.  Current diet order is Carb Modified, patient is consuming approximately 100% of meals at this time. Labs and medications reviewed.   No nutrition interventions warranted at this time. If nutrition issues arise, please consult RD.   Scarlette Ar RD, LDN Inpatient Clinical Dietitian Pager: [phone] After Hours Pager: [phone]

## 2015-11-11 NOTE — Evaluation (Signed)
Clinical/Bedside Swallow Evaluation Patient Details  Name: [name] MRN: [id number] Date of Birth: 1950/11/24  Today's Date: 11/11/2015 Time: SLP Start Time (ACUTE ONLY): 0913 SLP Stop Time (ACUTE ONLY): 0950 SLP Time Calculation (min) (ACUTE ONLY): 37 min  Past Medical History:  Past Medical History  Diagnosis Date  . Allergy   . Anxiety   . Depression   . Diabetes mellitus   . GERD (gastroesophageal reflux disease)   . Hyperlipidemia   . Hypertension   . Schizophrenia ([place])   . Polypharmacy   . Physical deconditioning   . Barrett esophagus     Barretts Esophagus - EGD 8/07. (Repeat 2-3 years)  . Nephrolithiasis     2008  . Asthma     PFT's (10/14/06)- mild obstructive airway dz, poor effort  . 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 CHF (congestive heart failure) (HCC)     EF 55% on echo (5/10), repeat ECHO (08/29/09) EF 0000000, Grade 1 Diastolic Dsfxn, Lt atrium mildly dilated, PA systolic pressure mildly elevated.  . Obesity   . PE (pulmonary embolism)     2010  . Gout   . Hypokalemia   . Fibromyalgia   . Vitamin D deficiency   . Steroid dependence   . Peripheral neuropathy ([place])   . Back pain   . Anemia    Past Surgical History:  Past Surgical History  Procedure Laterality Date  . Colonoscopy      benign polyps (12/2000), repeat colonoscopy performed in 2007  . Endometrial biopsy      10/03 benign columnar mucosa (limited material)  . Appendectomy    . Cholecystectomy    . Kidney surgery     HPI:  65 yo female adm to [facility] with left sided weakness and slurred speech- MRI negative for acute changes - possible right cerebellar cva = remote.  PMH + for Barrett's esophagus, schizoprhenia, GERD, DM.  Pt did not pass an RNSSS and SLP ordered.    Assessment / Plan / Recommendation Clinical Impression  Pt presents with functional oropharyngeal swallow ability.  She does have some premorbid dysphagia =  requiring her to take medications with applesauce for a "long time" per pt.  No indication of airway compromise with intake. However at end of evaluation, noted pt with wet voice - requiring cues to clear her throat.  Advised her to pay attention today to her swallowing given h/o deficits.  Recommend regular/thin diet with precautions.  Will follow up x1 due to premorbid deficits and concern for neuro event.      Aspiration Risk  Mild aspiration risk    Diet Recommendation Regular;Thin liquid   Liquid Administration via: Cup;Straw Medication Administration: Whole meds with puree Supervision: Patient able to self feed Compensations: Minimize environmental distractions;Slow rate;Small sips/bites;Other (Comment) (drink liquids during meal) Postural Changes: Seated upright at 90 degrees;Remain upright for at least 30 minutes after po intake    Other  Recommendations Oral Care Recommendations: Oral care BID   Follow up Recommendations    tbd   Frequency and Duration min 1 x/week  1 week       Prognosis Prognosis for Safe Diet Advancement: Good      Swallow Study   General Date of Onset: 11/11/15 HPI: 65 yo female adm to [facility] with left sided weakness and slurred speech- MRI negative for acute changes - possible right cerebellar cva = remote.  PMH + for Barrett's esophagus, schizoprhenia, GERD,  DM.  Pt did not pass an RNSSS and SLP ordered.  Type of Study: Bedside Swallow Evaluation Previous Swallow Assessment: UGI reflux, hiatal hernia Respiratory Status: Nasal cannula Behavior/Cognition: Alert;Cooperative;Pleasant mood Oral Cavity Assessment: Within Functional Limits Oral Care Completed by SLP: No Oral Cavity - Dentition: Adequate natural dentition Vision: Functional for self-feeding Self-Feeding Abilities: Able to feed self Patient Positioning: Upright in chair Baseline Vocal Quality: Normal Volitional Cough: Strong Volitional Swallow: Able to elicit    Oral/Motor/Sensory  Function Overall Oral Motor/Sensory Function: Mild impairment Facial Sensation: Reduced left Velum: Within Functional Limits   Ice Chips Ice chips: Not tested   Thin Liquid Thin Liquid: Within functional limits Presentation: Cup;Self Fed;Straw    Nectar Thick Nectar Thick Liquid: Not tested   Honey Thick Honey Thick Liquid: Not tested   Puree Puree: Within functional limits Presentation: Self Fed;Spoon   Solid   GO   Solid: Within functional limits Presentation: Self Fed    Functional Assessment Tool Used: clinical judgement Functional Limitations: Swallowing Swallow Current Status [id number]): At least 1 percent but less than 20 percent impaired, limited or restricted Swallow Goal Status [phone]): At least 1 percent but less than 20 percent impaired, limited or restricted   [name], [facility] SLP [phone]

## 2015-11-11 NOTE — Progress Notes (Addendum)
Patient request for pain medication for her legs. MD paged. Awaiting for call back.  Dr. Benjamine Mola call back verbalizing that he will resume home meds soon.  Ave Filter, RN

## 2015-11-11 NOTE — H&P (Signed)
Date: 11/11/2015               Patient Name:  [name] MRN: [id number]  DOB: [birth year] Age / Sex: 65 y.o., female   PCP: Raelyn Number, MD         Medical Service: Internal Medicine Teaching Service         Attending Physician: Dr. Axel Filler, MD    First Contact: Dr. Ignacia Marvel Pager: [phone]  Second Contact: Dr. Julious Oka Pager: [phone]       After Hours (After 5p/  First Contact Pager: [phone]  weekends / holidays): Second Contact Pager: [phone]   Chief Complaint: Left Sided Weakness  History of Present Illness: [name] is  65 year old female with an extensive past medical history including DM II, HLD, HTN, CHF, Schizophrenia, PE and Obesity presenting from [facility] with acute onset left sided weakness. Patient unable to provide much history, most of history is obtained from nursing, ED and neurology notes. Patient was found this afternoon (unclear what time 1400-1500) and was complaining of feeling "bad but had no focal deficits." She then began complaining of left sided weakness around 1630. Staff thought she was having anxiety and she was given Ativan. She then developed slurred speech and EMS was called. Code stroke was called on arrival to the ED and she was evaluated by Neurology who felt it likely she has a pontine stroke. She was non-verbal to the ED physician. She was able to answer simple questions for me but reports she does not recall what occurred today. Does note she her speech is different from baseline and reports being unable to move the left side of her body. She denies any headache, vision changes, shortness of breath, chest pain, palpations, nausea, vomiting, abdominal pain, dysuria.   Meds: Current Facility-Administered Medications  Medication Dose Route Frequency Provider Last Rate Last Dose  . 0.9 %  sodium chloride infusion   Intravenous Continuous Ejiroghene Arlyce Dice, MD 100 mL/hr at 11/11/15 0103    .  enoxaparin (LOVENOX) injection 40 mg  40 mg Subcutaneous QHS Ejiroghene E Emokpae, MD   40 mg at 11/11/15 0106  . insulin glargine (LANTUS) injection 10 Units  10 Units Subcutaneous QHS Bethena Roys, MD   10 Units at 11/11/15 0105  . pantoprazole (PROTONIX) EC tablet 40 mg  40 mg Oral BID Ejiroghene E Emokpae, MD      . senna-docusate (Senokot-S) tablet 1 tablet  1 tablet Oral QHS PRN Ejiroghene Arlyce Dice, MD        Allergies: Allergies as of 11/10/2015 - Review Complete 11/10/2015  Allergen Reaction Noted  . Benztropine mesylate  12/19/2008  . Codeine  12/19/2008  . Diazepam  12/19/2008  . Diphenhydramine hcl  09/23/2009  . Lisinopril  04/22/2009  . Penicillins  12/19/2008  . Sulfonamide derivatives  12/19/2008  . Thioridazine hcl Other (See Comments) 07/25/2009   Past Medical History  Diagnosis Date  . Allergy   . Anxiety   . Depression   . Diabetes mellitus   . GERD (gastroesophageal reflux disease)   . Hyperlipidemia   . Hypertension   . Schizophrenia ([place])   . Polypharmacy   . Physical deconditioning   . Barrett esophagus     Barretts Esophagus - EGD 8/07. (Repeat 2-3 years)  . Nephrolithiasis     2008  . Asthma     PFT's (10/14/06)- mild obstructive airway dz, poor effort  . CPAP (  continuous positive airway pressure) dependence     2/2 obesity hyperventilation syndrome  . CHF (congestive heart failure) (HCC)     EF 55% on echo (5/10), repeat ECHO (08/29/09) EF 0000000, Grade 1 Diastolic Dsfxn, Lt atrium mildly dilated, PA systolic pressure mildly elevated.  . Obesity   . PE (pulmonary embolism)     2010  . Gout   . Hypokalemia   . Fibromyalgia   . Vitamin D deficiency   . Steroid dependence   . Peripheral neuropathy ([place])   . Back pain   . Anemia    Past Surgical History  Procedure Laterality Date  . Colonoscopy      benign polyps (12/2000), repeat colonoscopy performed in 2007  . Endometrial biopsy      10/03 benign columnar mucosa (limited  material)  . Appendectomy    . Cholecystectomy    . Kidney surgery     Family History  Problem Relation Age of Onset  . Bone cancer Mother   . COPD Father    Social History   Social History  . Marital Status: Single    Spouse Name: N/A  . Number of Children: N/A  . Years of Education: N/A   Occupational History  . Not on file.   Social History Main Topics  . Smoking status: Former Smoker    Quit date: 06/23/1972  . Smokeless tobacco: Never Used  . Alcohol Use: No  . Drug Use: No  . Sexual Activity: No   Other Topics Concern  . Not on file   Social History Narrative    Review of Systems: Pertinent items noted in HPI and remainder of comprehensive ROS otherwise negative.  Physical Exam: Blood pressure 131/61, pulse 71, temperature 98.2 F (36.8 C), temperature source Oral, resp. rate 20, height 5\' 2"  (1.575 m), weight 201 lb 15.1 oz (91.6 kg), SpO2 99 %. General: alert, chronically ill appearing woman who appears at least 65 years older than her stated age lying comfortably in bed in no acute distress Head: normocephalic and atraumatic.  Eyes: vision grossly intact, pupils equal, pupils round, pupils reactive to light, no injection and anicteric.  Mouth: pharynx pink and moist, no erythema, and no exudates.  Neck: supple, full ROM, no thyromegaly, no JVD, and no carotid bruits.  Lungs: normal respiratory effort, no accessory muscle use, normal breath sounds, no crackles, and no wheezes. Heart: normal rate, regular rhythm, systolic murmur, no gallop, and no rub.  Abdomen: soft, non-tender, normal bowel sounds, mild distention, no guarding, no rebound tenderness Msk: no joint swelling, no joint warmth, and no redness over joints.  Pulses: 2+ DP/PT pulses bilaterally Extremities: No cyanosis, clubbing, edema. Venous stasis dermatitis BLE.   Neurologic: alert & oriented to person and place but not time, follows most commands, intermittent confusion answering some  questions appropriately and joking at times with occasional confusion, right gaze preference will not cross midline to the left, left sided facial droop with forehead spared, decreased sensation to touch on left side, 4+/5 strength in RUE, decreased grip strength in RUE, only able to wiggle left fingers and left great toe, RLE with 3/5 strength.  Lab results: Basic Metabolic Panel:  Recent Labs  11/10/15 1905 11/10/15 1931  NA 139 140  K 3.5 3.5  CL 98* 97*  CO2 29  --   GLUCOSE 107* 104*  BUN 27* 34*  CREATININE 1.61* 1.50*  CALCIUM 9.2  --    Liver Function Tests:  Recent Labs  11/10/15  1905  AST 23  ALT 18  ALKPHOS 75  BILITOT 0.1*  PROT 6.5  ALBUMIN 3.3*   CBC:  Recent Labs  11/10/15 1905 11/10/15 1931  WBC 9.5  --   NEUTROABS 6.7  --   HGB 10.4* 10.5*  HCT 30.8* 31.0*  MCV 95.4  --   PLT 191  --    CBG:  Recent Labs  11/10/15 1903  GLUCAP 104*   Coagulation:  Recent Labs  11/10/15 1938  LABPROT 14.1  INR 1.07   Urine Drug Screen: Drugs of Abuse     Component Value Date/Time   LABOPIA NONE DETECTED 11/10/2015 2142   COCAINSCRNUR NONE DETECTED 11/10/2015 2142   LABBENZ NONE DETECTED 11/10/2015 2142   AMPHETMU NONE DETECTED 11/10/2015 2142   THCU NONE DETECTED 11/10/2015 2142   LABBARB NONE DETECTED 11/10/2015 2142    Alcohol Level:  Recent Labs  11/10/15 1939  ETH <5   Urinalysis:  Recent Labs  11/10/15 2143  COLORURINE YELLOW  LABSPEC 1.011  PHURINE 6.5  GLUCOSEU NEGATIVE  HGBUR NEGATIVE  BILIRUBINUR NEGATIVE  KETONESUR NEGATIVE  PROTEINUR 30*  NITRITE NEGATIVE  LEUKOCYTESUR NEGATIVE   Imaging results:  Ct Angio Head W/cm &/or Wo Cm  11/10/2015  CLINICAL DATA:  Code stroke left-sided deficits.  Slurred speech. EXAM: CT ANGIOGRAPHY HEAD AND NECK TECHNIQUE: Multidetector CT imaging of the head and neck was performed using the standard protocol during bolus administration of intravenous contrast. Multiplanar CT image  reconstructions and MIPs were obtained to evaluate the vascular anatomy. Carotid stenosis measurements (when applicable) are obtained utilizing NASCET criteria, using the distal internal carotid diameter as the denominator. CONTRAST:  11mL OMNIPAQUE IOHEXOL 350 MG/ML SOLN COMPARISON:  Head MRI/ MRA 04/05/2010. Carotid Doppler ultrasound 03/30/2013. FINDINGS: CTA NECK Aortic arch: 3 vessel aortic arch. Brachiocephalic and subclavian arteries are patent without stenosis. Right carotid system: Patent without evidence of stenosis, dissection, or significant atherosclerosis. Left carotid system: Patent with minimal non stenotic calcified plaque at the carotid bifurcation. Vertebral arteries: Patent and codominant without stenosis. Skeleton: No acute osseous abnormality identified. Other neck: Mild motion artifact in the lung apices with likely subsegmental atelectasis posteriorly. Patchy ground-glass opacities are partially visualized extending from the perihilar regions. CTA HEAD Anterior circulation: Internal carotid arteries are patent from skullbase to carotid termini with mild non stenotic calcified plaque. ACAs and MCAs are patent without evidence of significant stenosis or major branch occlusion. No intracranial aneurysm is identified. Posterior circulation: Intracranial vertebral arteries are widely patent and codominant. Left PICA origin is patent. Right AICA and bilateral SCA origins are patent. Basilar artery is patent without stenosis. There is a fetal type origin of the right PCA. PCAs are patent without evidence of significant stenosis. Venous sinuses: Patent. Dominant left transverse and sigmoid sinuses. Anatomic variants: Fetal origin of the right PCA. IMPRESSION: 1. No evidence of large vessel occlusion. 2. Minimal intracranial and extracranial atherosclerosis without significant stenosis. 3. Partially visualized ground-glass opacities in the lungs, possibly mild edema. Correlate with any respiratory  complaints and chest radiography. Preliminary findings were discussed in person with Dr. Leonel Ramsay on 11/10/2015 at 7:58 p.m. Electronically Signed   By: Logan Bores M.D.   On: 11/10/2015 20:20   Ct Head Wo Contrast  11/10/2015  CLINICAL DATA:  Altered mental status.  Slurred speech EXAM: CT HEAD WITHOUT CONTRAST TECHNIQUE: Contiguous axial images were obtained from the base of the skull through the vertex without intravenous contrast. COMPARISON:  04/16/2015 FINDINGS: There is no evidence  of mass effect, midline shift, or extra-axial fluid collections. There is no evidence of a space-occupying lesion or intracranial hemorrhage. There is no evidence of a cortical-based area of acute infarction. There is generalized cerebral atrophy. There is periventricular white matter low attenuation likely secondary to microangiopathy. The ventricles and sulci are appropriate for the patient's age. The basal cisterns are patent. Visualized portions of the orbits are unremarkable. The visualized portions of the paranasal sinuses and mastoid air cells are unremarkable. Cerebrovascular atherosclerotic calcifications are noted. The osseous structures are unremarkable. IMPRESSION: 1. No acute intracranial pathology. Electronically Signed   By: Kathreen Devoid   On: 11/10/2015 19:20   Ct Angio Neck W/cm &/or Wo/cm  11/10/2015  CLINICAL DATA:  Code stroke left-sided deficits.  Slurred speech. EXAM: CT ANGIOGRAPHY HEAD AND NECK TECHNIQUE: Multidetector CT imaging of the head and neck was performed using the standard protocol during bolus administration of intravenous contrast. Multiplanar CT image reconstructions and MIPs were obtained to evaluate the vascular anatomy. Carotid stenosis measurements (when applicable) are obtained utilizing NASCET criteria, using the distal internal carotid diameter as the denominator. CONTRAST:  64mL OMNIPAQUE IOHEXOL 350 MG/ML SOLN COMPARISON:  Head MRI/ MRA 04/05/2010. Carotid Doppler ultrasound  03/30/2013. FINDINGS: CTA NECK Aortic arch: 3 vessel aortic arch. Brachiocephalic and subclavian arteries are patent without stenosis. Right carotid system: Patent without evidence of stenosis, dissection, or significant atherosclerosis. Left carotid system: Patent with minimal non stenotic calcified plaque at the carotid bifurcation. Vertebral arteries: Patent and codominant without stenosis. Skeleton: No acute osseous abnormality identified. Other neck: Mild motion artifact in the lung apices with likely subsegmental atelectasis posteriorly. Patchy ground-glass opacities are partially visualized extending from the perihilar regions. CTA HEAD Anterior circulation: Internal carotid arteries are patent from skullbase to carotid termini with mild non stenotic calcified plaque. ACAs and MCAs are patent without evidence of significant stenosis or major branch occlusion. No intracranial aneurysm is identified. Posterior circulation: Intracranial vertebral arteries are widely patent and codominant. Left PICA origin is patent. Right AICA and bilateral SCA origins are patent. Basilar artery is patent without stenosis. There is a fetal type origin of the right PCA. PCAs are patent without evidence of significant stenosis. Venous sinuses: Patent. Dominant left transverse and sigmoid sinuses. Anatomic variants: Fetal origin of the right PCA. IMPRESSION: 1. No evidence of large vessel occlusion. 2. Minimal intracranial and extracranial atherosclerosis without significant stenosis. 3. Partially visualized ground-glass opacities in the lungs, possibly mild edema. Correlate with any respiratory complaints and chest radiography. Preliminary findings were discussed in person with Dr. Leonel Ramsay on 11/10/2015 at 7:58 p.m. Electronically Signed   By: Logan Bores M.D.   On: 11/10/2015 20:20    Other results: EKG: Ectopic atrial rhythm Atrial premature complex. Borderline short PR interval Right bundle branch block. Left  ventricular hypertrophy. No significant change since last tracing   Assessment & Plan by Problem:  Left Sided Hemiparesis: Patient with acute onset left sided hemiparesis with slurred speech. Patient with right gaze preference, left-sided hypoesthesia, left sided hemiparesis consistent with new ischemic infarct. Unclear what time symptoms began. Patient was evaluated by Neurology in the ED, not a candidate for TPA. CT head negative for an acute abnormalities and CTA head/neck with no lesions amenable to IR per neurology. Will admit for stroke evaluation. MRA head and carotid dopplers not needed as CTA head/neck already done. -MRI -ECHO -A1c -Fasting lipid panel -ASA 325 mg, then 81 mg daily -Telemetry -PT, OT, Speech -NPO -Frequent neuro checks -Permissive HTN  to 220/110 -Attempt to get records from facility tomorrow  AKI: Cr 1.61 on admission, prior baseline in 2013 was 0.75-09.  -NS 100 mL/hr -BMP in am  DM II: Patient on Lantus 20 units qhs and metformin 500 mg bid at home.  -A1c -Lantus 10 units qhs -SSI- -CBG q4hr  HTN: BP 130s/70s on arrival. Allowing permissive HTN to 220/110 in setting of likely acute ischemic CVA. -hold home BP meds  CHF: Per History tab, EF 55% on echo (12/2008), repeat ECHO (08/29/09) EF 0000000, Grade 1 Diastolic Dsfxn, Lt atrium mildly dilated, PA systolic pressure mildly elevated. Cannot find these reports in EPIC. Appears patient is on Lasix (unclear dose, possibly for venous stasis), ASA 81 mg daily and Lipitor 20 mg daily at home. -ECHO  Polypharmacy: Patient has an extensive list of home medications with several duplicates and several different medications for the same problem. Holding most of her medications at this time. Will need to contact her facility tomorrow and clarify what medications she actually takes. Will need to address her med rec prior to discharge and likely D/C several medications.   HLD: Patient takes Atorvastatin 20 mg daily at  home. -Fasting Lipid Panel -Increase to high intensity dose after passes swallow evaluation  Schizophrenia: Patient takes ability, xanax, trazadone, doxepin at home. -holding home meds while NPO  Physical Deconditioning: Patient with 3/5 strength in RLE. Patient reports using a wheelchair at assisted living facility. -PT  DVT PPx: Lovenox  Code Status: Full Code, will attempt to reach out to family members in the morning and continue to reassess  Dispo: Disposition is deferred at this time, awaiting improvement of current medical problems.  The patient does have a current PCP (Imran Gertie Baron, MD) and does need an [facility] hospital follow-up appointment after discharge.  The patient does have transportation limitations that hinder transportation to clinic appointments.  Signed: Maryellen Pile, MD 11/11/2015, 2:37 AM

## 2015-11-11 NOTE — Progress Notes (Signed)
Subjective: She has become progressively more awake and active throughout the day today. Of note her left sided weakness seemed improved by this afternoon. She has also started complaining of abdominal pain that is new as well as neuropathic foot pain after withholding many medications this morning.  Objective: Vital signs in last 24 hours: Filed Vitals:   11/11/15 0700 11/11/15 0900 11/11/15 1100 11/11/15 1358  BP: 131/60 136/78 137/66 152/72  Pulse: 72 72 77 76  Temp: 97.9 F (36.6 C) 98.4 F (36.9 C) 97.9 F (36.6 C) 98.2 F (36.8 C)  TempSrc: Oral  Oral Oral  Resp: 20 20 20 20   Height:      Weight:      SpO2: 98% 100% 100% 100%   Weight change:   Intake/Output Summary (Last 24 hours) at 11/11/15 1532 Last data filed at 11/11/15 1357  Gross per 24 hour  Intake 1538.33 ml  Output     14 ml  Net 1524.33 ml   GENERAL- alert, ill appearing woman in mild discomfort HEENT- Mouth breathing with some drying of oral mucosa, eye movements intact bilaterally CARDIAC- RRR, no murmurs, rubs or gallops. RESP- CTAB, no wheezes or crackles. ABDOMEN- Diffuse tenderness to palpation, bowel sounds present throughout NEURO- Strength diminished in odd fashion on left arm this morning, strength symmetric on repeat exam, left leg remains weaker than right again limited active motion but slows falling EXTREMITIES- 1+ pitting edema in legs, with chronic venous stasis/skin hyperpigmentation in distal shins PSYCH- Flat affect, appropriate speech  Lab Results: Basic Metabolic Panel:  Recent Labs Lab 11/10/15 1905 11/10/15 1931 11/11/15 0239  NA 139 140 141  K 3.5 3.5 3.2*  CL 98* 97* 102  CO2 29  --  30  GLUCOSE 107* 104* 92  BUN 27* 34* 23*  CREATININE 1.61* 1.50* 1.41*  CALCIUM 9.2  --  8.7*   CBG:  Recent Labs Lab 11/10/15 1903 11/11/15 0101 11/11/15 0622  GLUCAP 104* 80 89   Fasting Lipid Panel:  Recent Labs Lab 11/11/15 0239  CHOL 160  HDL 35*  LDLCALC 87  TRIG  192*  CHOLHDL 4.6    Micro Results: Recent Results (from the past 240 hour(s))  MRSA PCR Screening     Status: Abnormal   Collection Time: 11/11/15 12:41 AM  Result Value Ref Range Status   MRSA by PCR POSITIVE (A) NEGATIVE Final    Comment:        The GeneXpert MRSA Assay (FDA approved for NASAL specimens only), is one component of a comprehensive MRSA colonization surveillance program. It is not intended to diagnose MRSA infection nor to guide or monitor treatment for MRSA infections. RESULT CALLED TO, READ BACK BY AND VERIFIED WITH: A MILLNER,RN @0409  11/11/15 MKELLY     Medications: I have reviewed the patient's current medications. Scheduled Meds: . ARIPiprazole  15 mg Oral Daily  . [START ON 11/12/2015] aspirin EC  81 mg Oral Daily  . enoxaparin (LOVENOX) injection  40 mg Subcutaneous QHS  . insulin glargine  10 Units Subcutaneous QHS  . pantoprazole  40 mg Oral BID  . potassium chloride  40 mEq Oral Once   Continuous Infusions:  PRN Meds:.senna-docusate, traMADol-acetaminophen Assessment/Plan: Principal Problem:   CVA (cerebral infarction) Active Problems:   Diabetic peripheral neuropathy (HCC)   OBESITY, NOS   Schizophrenia (HCC)   Depression   HYPERTENSION, BENIGN SYSTEMIC   DIASTOLIC HEART FAILURE, CHRONIC   Venous (peripheral) insufficiency   Asthma   Sleep apnea  Polypharmacy Left sided weakness: Left arm weakness now seems improved, left leg weakness continued but apparently is chronic after previous fracture and knee surgeries. Her weakness and mental status change seems somewhat generalized for a TIA but her baseline was unclear. There is some concern for leg weakness to also be related to lumbar spinal stenosis that was previously moderate to severe on imaging. She also has significant diabetic neuropathy with history of ulcers that could be contributing to her weakness. Polypharmacy could be strongly contributing to her overall presentation as she is  on numerous centrally acting and sedating medicines. -hgb A1c pending -Will resume PTA medications slowly or as needed -Continued workup for TIA embolic source -PT, OT, SLP  CHF: History of previous mild heart failure with LVEF 55-60% in 2011 with grade 1 diastolic dysfunction. Chronic hypertension. Will discuss her PTA regimen with ALF and start resuming medications as needed. Currently allowing HTN in setting of possible recent TIA.  CKD: SCr 0.9-1.0 in 2012, 1.5 on admission here. She looks maybe dehydrated and improved slightly with IV fluids overnight. Slightly HYPOkalemic on chronic lasix. -Repeat AM Bmet -K-Dur 40 mEq today  Neuropathy: Active complaint of foot pain today. Known diabetic neuropathy with possible contribution of lumbosacral radiculopathy. -Resume tramadol-acetaminophen q6hrs PRN  Diet: Carb mod VTE ppx: [place] enoxaparin FULL CODE  Dispo: Disposition is deferred at this time, awaiting improvement of current medical problems.  The patient does have a current PCP (Imran Gertie Baron, MD) and does not know need an [facility] hospital follow-up appointment after discharge.  The patient does have transportation limitations that hinder transportation to clinic appointments.   LOS: 1 day   Collier Salina, MD 11/11/2015, 3:32 PM

## 2015-11-11 NOTE — Progress Notes (Signed)
Pt arrived to 5M14 from [facility]. Pt alert and oriented x 4. Teley applied. Dinamap set up for Q2 vitals. Pt oriented to room and call-bell. Pt in no acute distress. Will continue to monitor.

## 2015-11-12 ENCOUNTER — Ambulatory Visit (HOSPITAL_COMMUNITY): Payer: Medicaid Other

## 2015-11-12 DIAGNOSIS — G8194 Hemiplegia, unspecified affecting left nondominant side: Secondary | ICD-10-CM

## 2015-11-12 LAB — HEMOGLOBIN A1C
Hgb A1c MFr Bld: 6.2 % — ABNORMAL HIGH (ref 4.8–5.6)
Mean Plasma Glucose: 131 mg/dL

## 2015-11-12 LAB — BASIC METABOLIC PANEL
Anion gap: 7 (ref 5–15)
BUN: 14 mg/dL (ref 6–20)
CO2: 30 mmol/L (ref 22–32)
Calcium: 9.1 mg/dL (ref 8.9–10.3)
Chloride: 105 mmol/L (ref 101–111)
Creatinine, Ser: 1.33 mg/dL — ABNORMAL HIGH (ref 0.44–1.00)
GFR calc Af Amer: 48 mL/min — ABNORMAL LOW (ref >60)
GFR calc non Af Amer: 41 mL/min — ABNORMAL LOW (ref >60)
Glucose, Bld: 141 mg/dL — ABNORMAL HIGH (ref 65–99)
Potassium: 3.7 mmol/L (ref 3.5–5.1)
Sodium: 142 mmol/L (ref 135–145)

## 2015-11-12 LAB — GLUCOSE, CAPILLARY: Glucose-Capillary: 135 mg/dL — ABNORMAL HIGH (ref 65–99)

## 2015-11-12 MED ORDER — AMLODIPINE BESYLATE 10 MG PO TABS
10.0000 mg | ORAL_TABLET | Freq: Every day | ORAL | Status: DC
  Administered 2015-11-12: 10 mg via ORAL
  Filled 2015-11-12: qty 1

## 2015-11-12 MED ORDER — ALBUTEROL SULFATE HFA 108 (90 BASE) MCG/ACT IN AERS: 1.0000 | INHALATION_SPRAY | RESPIRATORY_TRACT | Status: DC | PRN

## 2015-11-12 NOTE — Evaluation (Signed)
Occupational Therapy Evaluation Patient Details Name: [name] MRN: [id number] DOB: October 01, 1950 Today's Date: 11/12/2015    History of Present Illness 65 yo female adm to [facility] with left sided weakness and slurred speech- MRI negative for acute changes - possible right cerebellar cva = remote. PMH + for Barrett's esophagus, schizoprhenia, GERD, DM.    Clinical Impression   Pt reports she has required assist for ADLs PTA. Currently pt is overall min assist for ADLs and min guard for functional mobility. Recommend SNF for follow up in order to maximize independence and safety with ADLs and functional mobility. Pt would benefit from continued skilled OT to address established goals.    Follow Up Recommendations  SNF;Supervision/Assistance - 24 hour    Equipment Recommendations  None recommended by OT    Recommendations for Other Services       Precautions / Restrictions Precautions Precautions: Fall Precaution Comments: pt reports a fall with LE fracture several yrs ago as well as one one month ago Restrictions Weight Bearing Restrictions: No      Mobility Bed Mobility Overal bed mobility: Modified Independent                Transfers Overall transfer level: Needs assistance Equipment used: Rolling walker (2 wheeled) Transfers: Sit to/from Stand Sit to Stand: Min guard;+2 safety/equipment         General transfer comment: vc's for hand placement, min-guard A and +2 for safety since pt reports she doesn't ambulate    Balance Overall balance assessment: Needs assistance Sitting-balance support: No upper extremity supported Sitting balance-Leahy Scale: Good     Standing balance support: Bilateral upper extremity supported Standing balance-Leahy Scale: Poor Standing balance comment: Requires UE support for safety.                            ADL Overall ADL's : Needs assistance/impaired Eating/Feeding: Set up;Sitting   Grooming: Min  guard;Standing   Upper Body Bathing: Set up;Min guard;Sitting   Lower Body Bathing: Minimal assistance;Sit to/from stand   Upper Body Dressing : Set up;Supervision/safety;Sitting Upper Body Dressing Details (indicate cue type and reason): Pt able to don hospital gown with set up and supervision sitting EOB. Lower Body Dressing: Minimal assistance;Sit to/from stand Lower Body Dressing Details (indicate cue type and reason): Pt unable to don socks sitting EOB; pt reports someone assists her with those things PTA. Toilet Transfer: Min guard;Ambulation;RW;BSC (BSC over toilet) Toilet Transfer Details (indicate cue type and reason): Simulated by transfer from EOB. Toileting- Water quality scientist and Hygiene: Min guard;Sit to/from stand       Functional mobility during ADLs: Min guard;Rolling walker General ADL Comments: Pt is a poor historian and it is difficult to obtain PLOF.      Vision Vision Assessment?: No apparent visual deficits   Perception     Praxis      Pertinent Vitals/Pain Pain Assessment: No/denies pain     Hand Dominance Right   Extremity/Trunk Assessment Upper Extremity Assessment Upper Extremity Assessment: Generalized weakness   Lower Extremity Assessment Lower Extremity Assessment: Defer to PT evaluation   Cervical / Trunk Assessment Cervical / Trunk Assessment: Kyphotic   Communication Communication Communication: No difficulties   Cognition Arousal/Alertness: Awake/alert Behavior During Therapy: WFL for tasks assessed/performed Overall Cognitive Status: History of cognitive impairments - at baseline       Memory: Decreased short-term memory             General Comments  Exercises       Shoulder Instructions      Home Living Family/patient expects to be discharged to:: Assisted living     Type of Home: Assisted living                           Additional Comments: pt has been living at [facility], poor  historian so difficult to determine how long she has been nonambulatory  Lives With: Other (Comment) (lives at [facility], pt reports the site manages her bills, medications, appts, etc)    Prior Functioning/Environment Level of Independence: Needs assistance  Gait / Transfers Assistance Needed: pt reports she has not ambulated in 2 yrs but later reports she was ambulating until a month ago when she fell transferring. Has been indepednent with transfers to w/c and independent with w/c mobility ADL's / Homemaking Assistance Needed: needs assist for bathing and dressing        OT Diagnosis: Generalized weakness;Cognitive deficits   OT Problem List: Decreased strength;Decreased activity tolerance;Impaired balance (sitting and/or standing);Decreased cognition;Decreased safety awareness;Impaired sensation   OT Treatment/Interventions: Self-care/ADL training;Therapeutic exercise;Energy conservation;DME and/or AE instruction;Therapeutic activities;Cognitive remediation/compensation;Patient/family education;Balance training    OT Goals(Current goals can be found in the care plan section) Acute Rehab OT Goals Patient Stated Goal: to walk again OT Goal Formulation: With patient Time For Goal Achievement: 11/26/15 Potential to Achieve Goals: Good ADL Goals Pt Will Perform Grooming: with supervision;standing Pt Will Perform Upper Body Bathing: with supervision;sitting Pt Will Perform Lower Body Bathing: with supervision;sit to/from stand Pt Will Transfer to Toilet: with supervision;ambulating;bedside commode (over toilet) Pt Will Perform Toileting - Clothing Manipulation and hygiene: with supervision;sit to/from stand Pt Will Perform Tub/Shower Transfer: Shower transfer;with supervision;ambulating;shower seat;rolling walker  OT Frequency: Min 2X/week   Barriers to D/C:            Co-evaluation PT/OT/SLP Co-Evaluation/Treatment: Yes Reason for Co-Treatment: Necessary to address cognition/behavior  during functional activity;For patient/therapist safety PT goals addressed during session: Balance;Mobility/safety with mobility;Proper use of DME OT goals addressed during session: ADL's and self-care;Other (comment) (mobility)      End of Session Equipment Utilized During Treatment: Gait belt;Rolling walker  Activity Tolerance: Patient tolerated treatment well Patient left: in chair;with call bell/phone within reach;with chair alarm set;Other (comment) (O2 reapplied in sitting)   Time: [id number] OT Time Calculation (min): 30 min Charges:  OT General Charges $OT Visit: 1 Procedure OT Evaluation $OT Eval Moderate Complexity: 1 Procedure G-Codes: OT G-codes **NOT FOR INPATIENT CLASS** Functional Assessment Tool Used: Clinical judgement Functional Limitation: Self care Self Care Current Status [id number]): At least 1 percent but less than 20 percent impaired, limited or restricted Self Care Goal Status [id number]): At least 1 percent but less than 20 percent impaired, limited or restricted   Binnie Kand M.S., OTR/L Pager: [phone]  11/12/2015, 12:58 PM

## 2015-11-12 NOTE — Clinical Social Work Note (Signed)
Patient from [facility] and planning to return at d/c. Facility received referral and prepared for pt's return today, 3/29. Facility planning to arrange transport for patient to return. CSW attempted to contact patient's niece, Helene Kelp several times today and unable to leave a voice message. D/C summary faxed to facility. No further needs/concerns reported.   RN to call report prior to discharge.  Clinical Social Worker will sign off for now as social work intervention is no longer needed. Please consult [place] again if new need arises.  Glendon Axe, MSW, LCSWA [phone] 11/12/2015 3:18 PM

## 2015-11-12 NOTE — Progress Notes (Addendum)
Subjective: She had passed numerous formed stools yesterday with some improvement in abdominal pain. Feeling much improved today, walking with walker and sitting up in bedside chair. Her left sided weakness appears to be fully improved.  Objective: Vital signs in last 24 hours: Filed Vitals:   11/11/15 2124 11/12/15 0112 11/12/15 0511 11/12/15 0923  BP: 153/80 137/71 160/81 150/83  Pulse: 81 72 78 90  Temp: 97.5 F (36.4 C) 97.9 F (36.6 C) 97.9 F (36.6 C) 98.1 F (36.7 C)  TempSrc: Oral Oral Oral Oral  Resp: 18 18 20 20   Height:      Weight:      SpO2: 100% 100% 100% 100%   Weight change:   Intake/Output Summary (Last 24 hours) at 11/12/15 1024 Last data filed at 11/11/15 1732  Gross per 24 hour  Intake 2018.33 ml  Output      0 ml  Net 2018.33 ml   GENERAL- alert, ill appearing woman, NAD HEENT- Mouth breathing with some drying of oral mucosa CARDIAC- RRR, no murmurs, rubs or gallops. RESP- CTAB, no wheezes or crackles. ABDOMEN- Nontender to palpation, nondistended NEURO- 5/5 strength in extremities bilaterally, no parasthesias, some pain and allodynia in feet EXTREMITIES- 1+ pitting edema in legs, with chronic venous stasis/skin hyperpigmentation in distal shins PSYCH- Flat affect, appropriate speech  Lab Results: Basic Metabolic Panel:  Recent Labs Lab 11/11/15 0239 11/12/15 0300  NA 141 142  K 3.2* 3.7  CL 102 105  CO2 30 30  GLUCOSE 92 141*  BUN 23* 14  CREATININE 1.41* 1.33*  CALCIUM 8.7* 9.1   CBG:  Recent Labs Lab 11/10/15 1903 11/11/15 0101 11/11/15 0622 11/11/15 2134 11/12/15 0624  GLUCAP 104* 80 89 179* 135*    Medications: I have reviewed the patient's current medications. Scheduled Meds: . amLODipine  10 mg Oral Daily  . ARIPiprazole  15 mg Oral Daily  . aspirin EC  81 mg Oral Daily  . atorvastatin  20 mg Oral q1800  . enoxaparin (LOVENOX) injection  40 mg Subcutaneous QHS  . insulin glargine  10 Units Subcutaneous QHS  .  pantoprazole  40 mg Oral BID   Continuous Infusions:  PRN Meds:.senna-docusate, traMADol-acetaminophen Assessment/Plan: Left sided weakness: Weakness overly generalized for a TIA. This was most likely precipitated by a combination of numerous centrally active medications, in the setting of previous right sided stroke, lumbar spinal stenosis, previous left leg injuries, and some mild hypokalemia. Her mental status is improved today as well oriented to person and place but not the year, month, or date. -Will resume only partial medication list at discharge with follow up plan at her facility depending on how she progresses  CHF: History of previous mild heart failure with LVEF 55-60% in 2011 with grade 1 diastolic dysfunction. She was on a significant regimen with numerous agents disproportionate to severity of dysfunction. Will reassess and probably discharge on just amlodipine, other meds to be restarted or titrated as needed at ALF.  CKD: SCr 0.9-1.0 in 2012, 1.5->1.3 here. This may well be near her baseline function now. Hypokalemia is improved after oral K-Cl supplementation.  Neuropathy: Foot pain less severe today, treating with tramadol q6hrs.  Diet: Carb mod VTE ppx: [place] enoxaparin FULL CODE  Dispo: Patient most likely appropriate for discharge back to [facility] this afternoon if she is continuing to do well.  The patient does have a current PCP (Imran Gertie Baron, MD) and does not know need an [facility] hospital follow-up appointment  after discharge.  The patient does have transportation limitations that hinder transportation to clinic appointments.   LOS: 2 days   Collier Salina, MD 11/12/2015, 10:24 AM

## 2015-11-12 NOTE — Care Management Note (Signed)
Case Management Note  Patient Details  Name: [name] MRN: [id number] Date of Birth: 15-Aug-1951  Subjective/Objective:                    Action/Plan: Plan is for patient to discharge back to her ALF today. No further needs per CM.   Expected Discharge Date:                  Expected Discharge Plan:  Assisted Living / Rest Home  In-House Referral:     Discharge planning Services     Post Acute Care Choice:    Choice offered to:     DME Arranged:    DME Agency:     HH Arranged:    [place] Agency:     Status of Service:  Completed, signed off  Medicare Important Message Given:    Date Medicare IM Given:    Medicare IM give by:    Date Additional Medicare IM Given:    Additional Medicare Important Message give by:     If discussed at [place] of Stay Meetings, dates discussed:    Additional Comments:  Pollie Friar, RN 11/12/2015, 1:55 PM

## 2015-11-12 NOTE — NC FL2 (Signed)
[place] LEVEL OF CARE SCREENING TOOL     IDENTIFICATION  Patient Name: [name] Birthdate: February 22, 1951 Sex: female Admission Date (Current Location): 11/10/2015  [facility] and [place] Number:  Herbalist and Address:  [facility], [place] [address], [place], [place] [address]      Provider Number: [id number]  Attending Physician Name and Address:  Axel Filler, MD  Relative Name and Phone Number:       Current Level of Care: Hospital Recommended Level of Care: [place] Prior Approval Number:    Date Approved/Denied:   PASRR Number:    Discharge Plan:  (ALF)    Current Diagnoses: Patient Active Problem List   Diagnosis Date Noted  . CVA (cerebral infarction) 11/10/2015  . Physical deconditioning 07/01/2011  . Weakness 07/01/2011  . Diabetic foot ulcer ([place]) 05/19/2011  . Polypharmacy 02/09/2011  . BACK PAIN WITH RADICULOPATHY 05/25/2010  . DIABETES MELLITUS, TYPE I, UNCONTROLLED 08/18/2009  . DIASTOLIC HEART FAILURE, CHRONIC 04/25/2009  . ANEMIA 04/14/2009  . Sleep apnea 12/11/2008  . 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 Diabetic peripheral neuropathy ([place]) 05/04/2007  . HYPERCHOLESTEROLEMIA 10/13/2006  . Gout, unspecified 10/13/2006  . HYPOKALEMIA 10/13/2006  . OBESITY, NOS 10/13/2006  . Schizophrenia ([place]) 10/13/2006  . Depression 10/13/2006  . HYPERTENSION, BENIGN SYSTEMIC 10/13/2006  . Venous (peripheral) insufficiency 10/13/2006  . RHINITIS, ALLERGIC 10/13/2006  . Asthma 10/13/2006  . Reflux esophagitis 10/13/2006  . OSTEOARTHRITIS, MULTI SITES 10/13/2006  . INCONTINENCE, URGE 10/13/2006    Orientation RESPIRATION BLADDER Height & Weight     Self, Time, Situation, Place  O2 (at 1L) Continent Weight: 201 lb 15.1 oz (91.6 kg) Height:  5\' 2"  (157.5 cm)  BEHAVIORAL SYMPTOMS/MOOD NEUROLOGICAL BOWEL NUTRITION STATUS   (NONE )  (NONE ) Continent  CARB MODIFIED  AMBULATORY STATUS  COMMUNICATION OF NEEDS Skin   Independent Verbally Normal                       Personal Care Assistance Level of Assistance  Bathing, Feeding, Dressing Bathing Assistance: Limited assistance Feeding assistance: Independent Dressing Assistance: Limited assistance     Functional Limitations Info  Sight, Hearing, Speech Sight Info: Adequate Hearing Info: Adequate Speech Info: Adequate    SPECIAL CARE FACTORS FREQUENCY                       Contractures      Additional Factors Info  Code Status, Allergies Code Status Info: Full Code  Allergies Info: Benztropine Mesylate, Codeine, Diazepam, Diphenhydramine Hcl, Lisinopril, Penicillins, Sulfonamide Derivatives, Thioridazine Hcl           Current Medications (11/12/2015):  This is the current hospital active medication list Current Facility-Administered Medications  Medication Dose Route Frequency Provider Last Rate Last Dose  . amLODipine (NORVASC) tablet 10 mg  10 mg Oral Daily Collier Salina, MD      . ARIPiprazole (ABILIFY) tablet 15 mg  15 mg Oral Daily Ejiroghene Arlyce Dice, MD   15 mg at 11/12/15 [id number]  . aspirin EC tablet 81 mg  81 mg Oral Daily Collier Salina, MD   81 mg at 11/12/15 [id number]  . atorvastatin (LIPITOR) tablet 20 mg  20 mg Oral q1800 Collier Salina, MD   20 mg at 11/11/15 1732  . enoxaparin (LOVENOX) injection 40 mg  40 mg Subcutaneous QHS Ejiroghene E Emokpae, MD   40 mg at 11/11/15  2128  . insulin glargine (LANTUS) injection 10 Units  10 Units Subcutaneous QHS Bethena Roys, MD   10 Units at 11/11/15 2137  . pantoprazole (PROTONIX) EC tablet 40 mg  40 mg Oral BID Bethena Roys, MD   40 mg at 11/12/15 [id number]  . senna-docusate (Senokot-S) tablet 1 tablet  1 tablet Oral QHS PRN Ejiroghene Arlyce Dice, MD      . traMADol-acetaminophen (ULTRACET) 37.5-325 MG per tablet 1 tablet  1 tablet Oral Q6H PRN Collier Salina, MD   1 tablet at 11/12/15 [id number]     Discharge  Medications: Please see discharge summary for a list of discharge medications.  Relevant Imaging Results:  Relevant Lab Results:   Additional Information SSN [id number]  Glendon Axe, MSW, LCSWA [phone] 11/12/2015 11:12 AM

## 2015-11-12 NOTE — Progress Notes (Signed)
Speech Language Pathology Treatment: Dysphagia  Patient Details Name: [name] MRN: [id number] DOB: 1950/12/03 Today's Date: 11/12/2015 Time: 5436-0677 SLP Time Calculation (min) (ACUTE ONLY): 10 min  Assessment / Plan / Recommendation Clinical Impression  Pt sitting upright in chair - SLP encouraged pt to brush her teeth *set up assist* and requested pt to request assist for this task due to hygiene importance.   Pt stated "I don't have a toothbrush" - located one in room for pt.    SLP observed pt consuming water via straw after oral care - immediate subtle cough x1 noted with pt admitting to sensing "choking" on water.  Pt has just been up for toileting and brushed her teeth, there she was minimally dyspneic.  Educated pt to importance to rest and not consume po if short of breath with mod I to indicate reasoning.    Suspect patient swallow is at baseline - she has chronic issues for which she is managing.     HPI HPI: 65 yo female adm to [facility] with left sided weakness and slurred speech- MRI negative for acute changes - possible right cerebellar cva = remote.  PMH + for Barrett's esophagus, schizoprehenia, GERD, DM.  Pt did not pass an RNSSS and SLP ordered.       SLP Plan  All goals met     Recommendations  Diet recommendations: Regular Liquids provided via: Cup;Straw Medication Administration: Whole meds with puree (per pt wishes) Supervision: Patient able to self feed Compensations: Small sips/bites;Slow rate (assure pt is not orally pocketing) Postural Changes and/or Swallow Maneuvers: Seated upright 90 degrees;Upright 30-60 min after meal             Follow up Recommendations: None Plan: All goals met     GO           Functional Assessment Tool Used: clinical judgement Functional Limitations: Swallowing Swallow Current Status (C3403): At least 1 percent but less than 20 percent impaired, limited or restricted Swallow Goal Status [phone]): At least 1 percent but  less than 20 percent impaired, limited or restricted Swallow Discharge Status [phone]): At least 1 percent but less than 20 percent impaired, limited or restricted    Luanna Salk, [place] [facility] SLP [phone]

## 2015-11-12 NOTE — Evaluation (Signed)
Physical Therapy Evaluation Patient Details Name: [name] MRN: [id number] DOB: 11/17/1950 Today's Date: 11/12/2015   History of Present Illness  65 yo female adm to [facility] with left sided weakness and slurred speech- MRI negative for acute changes - possible right cerebellar cva = remote. PMH + for Barrett's esophagus, schizoprhenia, GERD, DM.   Clinical Impression  Pt admitted with above diagnosis. Pt currently with functional limitations due to the deficits listed below (see PT Problem List). Pt ambulated 120' with RW and min-guard A, noted balance impairment and generalized weakness.  Pt will benefit from skilled PT to increase their independence and safety with mobility to allow discharge to the venue listed below.  If SNF not an option for this pt, recommend HHPT upon return to ALF.    Follow Up Recommendations SNF;Supervision/Assistance - 24 hour    Equipment Recommendations  None recommended by PT    Recommendations for Other Services       Precautions / Restrictions Precautions Precautions: Fall Precaution Comments: pt reports a fall with LE fracture several yrs ago as well as one one month ago Restrictions Weight Bearing Restrictions: No      Mobility  Bed Mobility Overal bed mobility: Modified Independent                Transfers Overall transfer level: Needs assistance Equipment used: Rolling walker (2 wheeled) Transfers: Sit to/from Stand Sit to Stand: Min guard;+2 safety/equipment         General transfer comment: vc's for hand placement, min-guard A and +2 for safety since pt reports she doesn't ambulate  Ambulation/Gait Ambulation/Gait assistance: Min guard;+2 safety/equipment Ambulation Distance (Feet): 120 Feet Assistive device: Rolling walker (2 wheeled) Gait Pattern/deviations: Step-through pattern;Decreased stride length Gait velocity: decreased Gait velocity interpretation: Below normal speed for age/gender General Gait Details: +2  for chair behind but pt never needed to sit and in fact, kept asking to walk farther. Reports that she has really been wanting to walk but has been scared of falling. Relies on RW, mildly unsteady with turning  Stairs            Wheelchair Mobility    Modified Rankin (Stroke Patients Only) Modified Rankin (Stroke Patients Only) Pre-Morbid Rankin Score: Moderately severe disability Modified Rankin: Moderately severe disability     Balance Overall balance assessment: Needs assistance;History of Falls Sitting-balance support: No upper extremity supported Sitting balance-Leahy Scale: Good     Standing balance support: Bilateral upper extremity supported Standing balance-Leahy Scale: Poor Standing balance comment: requires UE support for safety                             Pertinent Vitals/Pain Pain Assessment: No/denies pain    Home Living Family/patient expects to be discharged to:: Skilled nursing facility     Type of Home: Assisted living           Additional Comments: pt has been living at [facility], poor historian so difficult to determine how long she has been nonambulatory    Prior Function Level of Independence: Needs assistance   Gait / Transfers Assistance Needed: pt reports she has not ambulated in 2 yrs but later reports she was ambulating until a month ago when she fell transferring. Has been indepednent with transfers to w/c and independent with w/c mobility  ADL's / Homemaking Assistance Needed: needs assist for bathing and dressing        Hand Dominance   Dominant  Hand: Right    Extremity/Trunk Assessment   Upper Extremity Assessment: Defer to OT evaluation           Lower Extremity Assessment: Generalized weakness      Cervical / Trunk Assessment: Kyphotic  Communication   Communication: No difficulties  Cognition Arousal/Alertness: Awake/alert Behavior During Therapy: WFL for tasks assessed/performed Overall  Cognitive Status: History of cognitive impairments - at baseline       Memory: Decreased short-term memory              General Comments      Exercises        Assessment/Plan    PT Assessment Patient needs continued PT services  PT Diagnosis Difficulty walking;Generalized weakness   PT Problem List Decreased strength;Decreased activity tolerance;Decreased balance;Decreased mobility;Decreased cognition;Decreased knowledge of use of DME;Decreased knowledge of precautions  PT Treatment Interventions DME instruction;Gait training;Functional mobility training;Therapeutic activities;Therapeutic exercise;Balance training;Patient/family education   PT Goals (Current goals can be found in the Care Plan section) Acute Rehab PT Goals Patient Stated Goal: to walk again PT Goal Formulation: With patient Time For Goal Achievement: 11/26/15 Potential to Achieve Goals: Good    Frequency Min 3X/week   Barriers to discharge        Co-evaluation PT/OT/SLP Co-Evaluation/Treatment: Yes Reason for Co-Treatment: Necessary to address cognition/behavior during functional activity;For patient/therapist safety PT goals addressed during session: Balance;Mobility/safety with mobility;Proper use of DME         End of Session Equipment Utilized During Treatment: Gait belt Activity Tolerance: Patient tolerated treatment well Patient left: in chair;with chair alarm set;with call bell/phone within reach Nurse Communication: Mobility status    Functional Assessment Tool Used: clinical judgement Functional Limitation: Mobility: Walking and moving around Mobility: Walking and Moving Around Current Status [id number]): At least 20 percent but less than 40 percent impaired, limited or restricted Mobility: Walking and Moving Around Goal Status [phone]): At least 1 percent but less than 20 percent impaired, limited or restricted    Time: [id number] PT Time Calculation (min) (ACUTE ONLY): 30  min   Charges:   PT Evaluation $PT Eval Moderate Complexity: 1 Procedure     PT G Codes:   PT G-Codes **NOT FOR INPATIENT CLASS** Functional Assessment Tool Used: clinical judgement Functional Limitation: Mobility: Walking and moving around Mobility: Walking and Moving Around Current Status [id number]): At least 20 percent but less than 40 percent impaired, limited or restricted Mobility: Walking and Moving Around Goal Status [phone]): At least 1 percent but less than 20 percent impaired, limited or restricted   Leighton Roach, PT  Acute Rehab Services  [phone]  Leighton Roach 11/12/2015, 11:48 AM

## 2015-11-12 NOTE — Evaluation (Signed)
Speech Language Pathology Evaluation Patient Details Name: [name] MRN: [id number] DOB: 1951-03-03 Today's Date: 11/12/2015 Time: 1000-1017 SLP Time Calculation (min) (ACUTE ONLY): 17 min  Problem List:  Patient Active Problem List   Diagnosis Date Noted  . CVA (cerebral infarction) 11/10/2015  . Physical deconditioning 07/01/2011  . Weakness 07/01/2011  . Diabetic foot ulcer ([place]) 05/19/2011  . Polypharmacy 02/09/2011  . BACK PAIN WITH RADICULOPATHY 05/25/2010  . DIABETES MELLITUS, TYPE I, UNCONTROLLED 08/18/2009  . DIASTOLIC HEART FAILURE, CHRONIC 04/25/2009  . ANEMIA 04/14/2009  . Sleep apnea 12/11/2008  . 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 Diabetic peripheral neuropathy ([place]) 05/04/2007  . HYPERCHOLESTEROLEMIA 10/13/2006  . Gout, unspecified 10/13/2006  . HYPOKALEMIA 10/13/2006  . OBESITY, NOS 10/13/2006  . Schizophrenia ([place]) 10/13/2006  . Depression 10/13/2006  . HYPERTENSION, BENIGN SYSTEMIC 10/13/2006  . Venous (peripheral) insufficiency 10/13/2006  . RHINITIS, ALLERGIC 10/13/2006  . Asthma 10/13/2006  . Reflux esophagitis 10/13/2006  . OSTEOARTHRITIS, MULTI SITES 10/13/2006  . INCONTINENCE, URGE 10/13/2006   Past Medical History:  Past Medical History  Diagnosis Date  . Allergy   . Anxiety   . Depression   . Diabetes mellitus   . GERD (gastroesophageal reflux disease)   . Hyperlipidemia   . Hypertension   . Schizophrenia ([place])   . Polypharmacy   . Physical deconditioning   . Barrett esophagus     Barretts Esophagus - EGD 8/07. (Repeat 2-3 years)  . Nephrolithiasis     2008  . Asthma     PFT's (10/14/06)- mild obstructive airway dz, poor effort  . 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 CHF (congestive heart failure) ([facility])     EF 55% on echo (5/10), repeat ECHO (08/29/09) EF 0000000, Grade 1 Diastolic Dsfxn, Lt atrium mildly dilated, PA systolic pressure mildly elevated.  . Obesity   . PE  (pulmonary embolism)     2010  . Gout   . Hypokalemia   . Fibromyalgia   . Vitamin D deficiency   . Steroid dependence   . Peripheral neuropathy ([place])   . Back pain   . Anemia    Past Surgical History:  Past Surgical History  Procedure Laterality Date  . Colonoscopy      benign polyps (12/2000), repeat colonoscopy performed in 2007  . Endometrial biopsy      10/03 benign columnar mucosa (limited material)  . Appendectomy    . Cholecystectomy    . Kidney surgery     HPI:  65 yo female adm to [facility] with left sided weakness and slurred speech- MRI negative for acute changes - possible right cerebellar cva = remote.  PMH + for Barrett's esophagus, schizoprhenia, GERD, DM.  Pt did not pass an RNSSS and SLP ordered.    Assessment / Plan / Recommendation Clinical Impression  Pt reports baseline memory deficits - and given she resides in ALF and reports they manage her bills, medications appts, etc provided functional evaluation.  Pt did recall SLP information shared yesterday re: need to clear her throat if her voice was gurgly without cues!!!!    She was also asking for SLP to "help" from outside of the room -  discovered it was due to her call bell being on the bed and her concern for fall risk!  This indicates good problem solving and judgement skills.    Left facial decreased movement in lower region noted - which pt reports is new.  Pt  able to follow 2 step commands and repeat lengthy sentence.  No family present to provide baseline function but speech is intelligible and suspect at baseline- ? Excessive lingual movement source - ? Consistent with Tardive dyskinesia.    As pt has support needed at facillity and imaging negative, no follow up indicated.     SLP Assessment  Patient does not need any further Speech Lanaguage Pathology Services    Follow Up Recommendations  None    Frequency and Duration           SLP Evaluation Prior Functioning  Cognitive/Linguistic Baseline:  Baseline deficits Baseline deficit details: pt reports basline memory deficits Type of Home: Assisted living  Lives With: Other (Comment) (lives at ALF, pt reports the site manages her bills, medications, appts, etc)   Cognition  Overall Cognitive Status: No family/caregiver present to determine baseline cognitive functioning Arousal/Alertness: Awake/alert Orientation Level: Oriented to place;Oriented to situation;Oriented to person;Disoriented to time (pt reports she does keep up with dates) Attention: Sustained Sustained Attention: Appears intact Memory: Appears intact (for current environment, pt recalled need to call for assistance ) Awareness: Appears intact (symptoms coming in, improved symptoms) Problem Solving: Appears intact (need to call for assist - basic problem solving adequte) Executive Function:  (pt not at higher level ) Safety/Judgment: Appears intact    Comprehension  Auditory Comprehension Overall Auditory Comprehension: Appears within functional limits for tasks assessed Yes/No Questions: Not tested Commands: Within Functional Limits (for 2 step commands) Conversation: Complex Visual Recognition/Discrimination Discrimination: Not tested Reading Comprehension Reading Status: Not tested (pt does not have glasses present)    Expression Expression Primary Mode of Expression: Verbal Verbal Expression Overall Verbal Expression: Appears within functional limits for tasks assessed (functional for current environment) Initiation: No impairment Level of Generative/Spontaneous Verbalization: Sentence Repetition: No impairment Naming: Not tested Pragmatics: No impairment Written Expression Dominant Hand: Right Written Expression: Not tested (pt does not have glasses available)   Oral / Motor  Oral Motor/Sensory Function Overall Oral Motor/Sensory Function: Mild impairment (excessive lingual movement, ? consistent with tardive dyskinesia from pyschotropic medications-  ) Facial ROM: Reduced left Velum: Within Functional Limits Mandible: Within Functional Limits Motor Speech Overall Motor Speech: Appears within functional limits for tasks assessed (pt denies speech changes, state she is normal) Respiration: Within functional limits Resonance: Within functional limits Articulation: Within functional limitis Intelligibility: Intelligible Motor Planning: Not tested   [place], [place], [facility] SLP [phone]

## 2015-11-12 NOTE — Discharge Instructions (Signed)
There was no stroke found on head imaging during this hospitalization. Symptoms were most likely from the interaction of numerous prescription medications in the setting of several chronic health problems.  As a result many of your home medications were stopped at this hospital visit. You may need some of these restarted by your regular doctor after leaving and should do so at their discretion.

## 2015-11-12 NOTE — Discharge Summary (Signed)
Name: [name] MRN: [id number] DOB: 06/20/1951 65 y.o. PCP: Raelyn Number, MD  Date of Admission: 11/10/2015  6:40 PM Date of Discharge: 11/12/2015 Attending Physician: Axel Filler, MD  Discharge Diagnosis: Principal Problem:   Left sided weakness Active Problems:   Diabetic peripheral neuropathy (HCC)   OBESITY, NOS   Schizophrenia ([place])   Depression   HYPERTENSION, BENIGN SYSTEMIC   DIASTOLIC HEART FAILURE, CHRONIC   Venous (peripheral) insufficiency   Asthma   Sleep apnea   Polypharmacy  Discharge Medications:   Medication List    STOP taking these medications        ALPRAZolam 0.5 MG tablet  Commonly known as:  XANAX     cetirizine 10 MG tablet  Commonly known as:  ZYRTEC     clindamycin 300 MG capsule  Commonly known as:  CLEOCIN     cloNIDine 0.1 MG tablet  Commonly known as:  CATAPRES     cloNIDine 0.2 MG tablet  Commonly known as:  CATAPRES     divalproex 125 MG DR tablet  Commonly known as:  DEPAKOTE     doxepin 100 MG capsule  Commonly known as:  SINEQUAN     doxepin 25 MG capsule  Commonly known as:  SINEQUAN     escitalopram 10 MG tablet  Commonly known as:  LEXAPRO     gabapentin 400 MG capsule  Commonly known as:  NEURONTIN     hydrochlorothiazide 25 MG tablet  Commonly known as:  HYDRODIURIL     magnesium oxide 400 MG tablet  Commonly known as:  MAG-OX     mupirocin ointment 2 %  Commonly known as:  BACTROBAN     oxybutynin 5 MG tablet  Commonly known as:  DITROPAN     pantoprazole 40 MG tablet  Commonly known as:  PROTONIX     potassium chloride 10 MEQ tablet  Commonly known as:  K-DUR     potassium chloride SA 20 MEQ tablet  Commonly known as:  K-DUR,KLOR-CON     pregabalin 100 MG capsule  Commonly known as:  LYRICA     ranitidine 300 MG capsule  Commonly known as:  ZANTAC     ranitidine 75 MG tablet  Commonly known as:  ZANTAC     spironolactone 100 MG tablet  Commonly known as:  ALDACTONE     traMADol-acetaminophen 37.5-325 MG tablet  Commonly known as:  ULTRACET     vitamin C 100 MG tablet      TAKE these medications        albuterol 108 (90 Base) MCG/ACT inhaler  Commonly known as:  PROVENTIL HFA;VENTOLIN HFA  Inhale 1 puff into the lungs every 4 (four) hours as needed for wheezing or shortness of breath.     amLODipine 10 MG tablet  Commonly known as:  NORVASC  Take 10 mg by mouth daily.     ARIPiprazole 15 MG tablet  Commonly known as:  ABILIFY  Take 15 mg by mouth daily.     aspirin 81 MG tablet  Take 81 mg by mouth daily.     atorvastatin 20 MG tablet  Commonly known as:  LIPITOR  Take 20 mg by mouth daily.     docusate sodium 100 MG capsule  Commonly known as:  COLACE  Take 100 mg by mouth 2 (two) times daily.     EPINEPHrine 0.3 mg/0.3 mL Devi  Commonly known as:  EPI-PEN  Inject 0.3 mg into the muscle  once.     furosemide 20 MG tablet  Commonly known as:  LASIX  Take 20 mg by mouth daily.     insulin glargine 100 UNIT/ML injection  Commonly known as:  LANTUS  Inject 20 Units into the skin at bedtime.     Insulin Syringes (Disposable) U-100 0.5 ML Misc  Use as directed     ipratropium 0.02 % nebulizer solution  Commonly known as:  ATROVENT  Take 0.5 mg by nebulization 4 (four) times daily.     lactulose 10 GM/15ML solution  Commonly known as:  CHRONULAC  Take 30 g by mouth daily as needed for mild constipation.     metFORMIN 500 MG tablet  Commonly known as:  GLUCOPHAGE  Take 1 tablet (500 mg total) by mouth 2 (two) times daily.     multivitamin with minerals Tabs tablet  Take 1 tablet by mouth daily.     omeprazole 20 MG capsule  Commonly known as:  PRILOSEC  Take 20 mg by mouth 2 (two) times daily.     traMADol 50 MG tablet  Commonly known as:  ULTRAM  Take 50 mg by mouth every 6 (six) hours as needed for moderate pain.     traZODone 100 MG tablet  Commonly known as:  DESYREL  Take 50 mg by mouth at bedtime.         Disposition and follow-up:   Ms.Darah Celik was discharged from [facility] in Good condition.  At the hospital follow up visit please address:  Polypharmacy: Based on hospital workup the most likely explanation of her mental status and weakness would be an adverse interaction in her numerous medications. Many were held during admission and at discharge, addressed by problem below. We recommend resuming some of her previous medications as needed with the goal to limit her total burden of particularly central acting medications where possible.  Schizophrenia: Olanzapine, divalproex, trazodone, and xanax were discontinued, and aripiprazole was continued. She did not develop noticeable positive or negative symptoms within her 2 days hospitalization. In particular benzodiazepine and depakote may contribute to her symptoms, recommend re-add or titrate only as appropriate.  Congestive heart failure and hypertension: From history her heart failure was relatively mild with LVEF around 55% and grade I diastolic dysfunction. Systolic blood pressures remained in 110s-160 during this admission with all medications held. Amlodipine 10mg  resumed at discharge. Coreg, spironolactone, lasix, clonidine, and metolazone were held. Potassium supplement also held along with these diuretics, and will need serum chemistry checked at follow up.  COPD: She maintained oxygen saturations >92% easily on 2L by [place] or less oxygen during this admission. Scheduled ipratropium and as needed albuterol continued at discharge.  Labs / imaging needed at time of follow-up: Basic metabolic panel  Follow-up Appointments: Follow-up Information    Follow up with HAGUE, Rosalyn Charters, MD. Schedule an appointment as soon as possible for a visit in 2 weeks.   Specialty:  Internal Medicine   Why:  Hospital follow up and med reconciliation   Contact information:   [address] [place] [address] [phone]        Discharge Instructions:   Consultations:    Procedures Performed:  Ct Angio Head W/cm &/or Wo Cm  11/10/2015  CLINICAL DATA:  Code stroke left-sided deficits.  Slurred speech. EXAM: CT ANGIOGRAPHY HEAD AND NECK TECHNIQUE: Multidetector CT imaging of the head and neck was performed using the standard protocol during bolus administration of intravenous contrast. Multiplanar CT  image reconstructions and MIPs were obtained to evaluate the vascular anatomy. Carotid stenosis measurements (when applicable) are obtained utilizing NASCET criteria, using the distal internal carotid diameter as the denominator. CONTRAST:  66mL OMNIPAQUE IOHEXOL 350 MG/ML SOLN COMPARISON:  Head MRI/ MRA 04/05/2010. Carotid Doppler ultrasound 03/30/2013. FINDINGS: CTA NECK Aortic arch: 3 vessel aortic arch. Brachiocephalic and subclavian arteries are patent without stenosis. Right carotid system: Patent without evidence of stenosis, dissection, or significant atherosclerosis. Left carotid system: Patent with minimal non stenotic calcified plaque at the carotid bifurcation. Vertebral arteries: Patent and codominant without stenosis. Skeleton: No acute osseous abnormality identified. Other neck: Mild motion artifact in the lung apices with likely subsegmental atelectasis posteriorly. Patchy ground-glass opacities are partially visualized extending from the perihilar regions. CTA HEAD Anterior circulation: Internal carotid arteries are patent from skullbase to carotid termini with mild non stenotic calcified plaque. ACAs and MCAs are patent without evidence of significant stenosis or major branch occlusion. No intracranial aneurysm is identified. Posterior circulation: Intracranial vertebral arteries are widely patent and codominant. Left PICA origin is patent. Right AICA and bilateral SCA origins are patent. Basilar artery is patent without stenosis. There is a fetal type origin of the right PCA. PCAs are patent without evidence of  significant stenosis. Venous sinuses: Patent. Dominant left transverse and sigmoid sinuses. Anatomic variants: Fetal origin of the right PCA. IMPRESSION: 1. No evidence of large vessel occlusion. 2. Minimal intracranial and extracranial atherosclerosis without significant stenosis. 3. Partially visualized ground-glass opacities in the lungs, possibly mild edema. Correlate with any respiratory complaints and chest radiography. Preliminary findings were discussed in person with Dr. Leonel Ramsay on 11/10/2015 at 7:58 p.m. Electronically Signed   By: Logan Bores M.D.   On: 11/10/2015 20:20   Dg Abd 1 View  11/11/2015  CLINICAL DATA:  Diarrhea since last night with abdominal pain and distention. EXAM: ABDOMEN - 1 VIEW COMPARISON:  09/30/2010 FINDINGS: Bowel gas pattern is nonobstructive with moderate fecal retention throughout the colon in over the rectum. No dilated small bowel loops. No free peritoneal air. No mass or mass effect. There are degenerative changes of the spine and hips. IMPRESSION: Nonobstructive bowel gas pattern with moderate fecal retention throughout the colon and over the rectum. Electronically Signed   By: Marin Olp M.D.   On: 11/11/2015 16:52   Ct Head Wo Contrast  11/10/2015  CLINICAL DATA:  Altered mental status.  Slurred speech EXAM: CT HEAD WITHOUT CONTRAST TECHNIQUE: Contiguous axial images were obtained from the base of the skull through the vertex without intravenous contrast. COMPARISON:  04/16/2015 FINDINGS: There is no evidence of mass effect, midline shift, or extra-axial fluid collections. There is no evidence of a space-occupying lesion or intracranial hemorrhage. There is no evidence of a cortical-based area of acute infarction. There is generalized cerebral atrophy. There is periventricular white matter low attenuation likely secondary to microangiopathy. The ventricles and sulci are appropriate for the patient's age. The basal cisterns are patent. Visualized portions of the  orbits are unremarkable. The visualized portions of the paranasal sinuses and mastoid air cells are unremarkable. Cerebrovascular atherosclerotic calcifications are noted. The osseous structures are unremarkable. IMPRESSION: 1. No acute intracranial pathology. Electronically Signed   By: Kathreen Devoid   On: 11/10/2015 19:20   Ct Angio Neck W/cm &/or Wo/cm  11/10/2015  CLINICAL DATA:  Code stroke left-sided deficits.  Slurred speech. EXAM: CT ANGIOGRAPHY HEAD AND NECK TECHNIQUE: Multidetector CT imaging of the head and neck was performed using the standard protocol during  bolus administration of intravenous contrast. Multiplanar CT image reconstructions and MIPs were obtained to evaluate the vascular anatomy. Carotid stenosis measurements (when applicable) are obtained utilizing NASCET criteria, using the distal internal carotid diameter as the denominator. CONTRAST:  4mL OMNIPAQUE IOHEXOL 350 MG/ML SOLN COMPARISON:  Head MRI/ MRA 04/05/2010. Carotid Doppler ultrasound 03/30/2013. FINDINGS: CTA NECK Aortic arch: 3 vessel aortic arch. Brachiocephalic and subclavian arteries are patent without stenosis. Right carotid system: Patent without evidence of stenosis, dissection, or significant atherosclerosis. Left carotid system: Patent with minimal non stenotic calcified plaque at the carotid bifurcation. Vertebral arteries: Patent and codominant without stenosis. Skeleton: No acute osseous abnormality identified. Other neck: Mild motion artifact in the lung apices with likely subsegmental atelectasis posteriorly. Patchy ground-glass opacities are partially visualized extending from the perihilar regions. CTA HEAD Anterior circulation: Internal carotid arteries are patent from skullbase to carotid termini with mild non stenotic calcified plaque. ACAs and MCAs are patent without evidence of significant stenosis or major branch occlusion. No intracranial aneurysm is identified. Posterior circulation: Intracranial  vertebral arteries are widely patent and codominant. Left PICA origin is patent. Right AICA and bilateral SCA origins are patent. Basilar artery is patent without stenosis. There is a fetal type origin of the right PCA. PCAs are patent without evidence of significant stenosis. Venous sinuses: Patent. Dominant left transverse and sigmoid sinuses. Anatomic variants: Fetal origin of the right PCA. IMPRESSION: 1. No evidence of large vessel occlusion. 2. Minimal intracranial and extracranial atherosclerosis without significant stenosis. 3. Partially visualized ground-glass opacities in the lungs, possibly mild edema. Correlate with any respiratory complaints and chest radiography. Preliminary findings were discussed in person with Dr. Leonel Ramsay on 11/10/2015 at 7:58 p.m. Electronically Signed   By: Logan Bores M.D.   On: 11/10/2015 20:20   Mr Brain Wo Contrast  11/11/2015  CLINICAL DATA:  Initial evaluation for left-sided hemi paresis, possible stroke. EXAM: MRI HEAD WITHOUT CONTRAST TECHNIQUE: Multiplanar, multiecho pulse sequences of the brain and surrounding structures were obtained without intravenous contrast. COMPARISON:  Prior study from 11/10/2015. FINDINGS: Cerebral volume within normal limits for patient age. Mild chronic small vessel ischemic disease present within the periventricular and deep white matter both cerebral hemispheres. Probable small remote infarct present within the medial right cerebellar hemisphere (series 9, image 20). No other areas of chronic infarction. No abnormal foci of restricted diffusion to suggest acute infarct. Gray-white matter differentiation well maintained. Major intracranial vascular flow voids preserved. No acute or chronic intracranial hemorrhage. No mass lesion, midline shift, or mass effect. No hydrocephalus. No extra-axial fluid collection. Major dural sinuses are grossly patent. Craniocervical junction within normal limits. Visualized upper cervical spine  unremarkable. Pituitary gland within normal limits. No acute abnormality about the orbits. Mild mucosal thickening within the visualized paranasal sinuses. Retention cysts within the left max O sinus partially visualized. Small bilateral mastoid effusions, slightly larger on the right. Inner ear structures normal. Bone marrow signal intensity within normal limits. No scalp soft tissue abnormality. IMPRESSION: 1. No acute intracranial infarct or other process identified. 2. Probable small remote infarct within the medial right cerebellar hemisphere. 3. Mild chronic small vessel ischemic disease. Electronically Signed   By: Jeannine Boga M.D.   On: 11/11/2015 04:15    Admission HPI: Ms. Salzillo is 65 year old female with an extensive past medical history including DM II, HLD, HTN, CHF, Schizophrenia, PE and Obesity presenting from [place] with acute onset left sided weakness. Patient unable to provide much history, most  of history is obtained from nursing, ED and neurology notes. Patient was found this afternoon (unclear what time 1400-1500) and was complaining of feeling "bad but had no focal deficits." She then began complaining of left sided weakness around 1630. Staff thought she was having anxiety and she was given Ativan. She then developed slurred speech and EMS was called. Code stroke was called on arrival to the ED and she was evaluated by Neurology who felt it likely she has a pontine stroke. She was non-verbal to the ED physician. She was able to answer simple questions for me but reports she does not recall what occurred today. Does note she her speech is different from baseline and reports being unable to move the left side of her body. She denies any headache, vision changes, shortness of breath, chest pain, palpations, nausea, vomiting, abdominal pain, dysuria.  Hospital Course by problem list: Principal Problem:   Left sided weakness Active Problems:    Diabetic peripheral neuropathy (HCC)   OBESITY, NOS   Schizophrenia (HCC)   Depression   HYPERTENSION, BENIGN SYSTEMIC   DIASTOLIC HEART FAILURE, CHRONIC   Venous (peripheral) insufficiency   Asthma   Sleep apnea   Polypharmacy  Toxic encephalopathy: Initially presenting with decreased alertness as well as decreased strength diffusely across left face, arm, and leg prompting work up to rule out acute CVA. MRI head was obtained showing no acute CVA. Most of her medications were held due to concern for toxic encephalopathy related to numerus centrally active medications. Medical records were obtained from [facility] where she resides. By hospital day 1 in the afternoon she showed marked improvement in her weakness. Tramadol was resumed due to increased neuropathic pain in her legs without weakness or parasthesias. By day 2 she was walking with walker and sitting up in bedside chair and eager to return home.  AKI on CKD: SCr 1.61 on admission, decreased to 1.33 with IVF and withholding diuretics. Unclear her true baseline 0.90 documented in system in 2013. Minor hypokalemia was corrected with PO K-Cl. Urine output was good.  CHF: Per reported history EF 55% on echo (12/2008), 55-60% on echo (08/29/09), Grade 1 Diastolic Dsfxn, Lt atrium mildly dilated, PA systolic pressure mildly elevated. Studies were not in system and as a result not personally reviewed. Medications held during admission with mild elevation in blood pressure to a maximum 160 SBP after 2 days off all medications. She had a stable mild pedal edema during the admission. Amlodipine 10mg  was resumed prior to discharge.  COPD: She did not suffer significant hypoxia or dyspnea during her admission. She was maintained on 2L or less supplemental oxygen reported as her home baseline.  Schizophrenia: Home medications olanzapine, depakote, and trazodone held during this admission, continuing aripiprazole. She was appropriate and feeling  well 2 days on this medication.  Discharge Vitals:   BP 150/83 mmHg  Pulse 90  Temp(Src) 98.1 F (36.7 C) (Oral)  Resp 20  Ht 5\' 2"  (1.575 m)  Wt 91.6 kg (201 lb 15.1 oz)  BMI 36.93 kg/m2  SpO2 100%  Discharge Labs:  Results for orders placed or performed during the hospital encounter of 11/10/15 (from the past 24 hour(s))  Glucose, capillary     Status: Abnormal   Collection Time: 11/11/15  9:34 PM  Result Value Ref Range   Glucose-Capillary 179 (H) 65 - 99 mg/dL  Basic metabolic panel     Status: Abnormal   Collection Time: 11/12/15  3:00 AM  Result Value  Ref Range   Sodium 142 135 - 145 mmol/L   Potassium 3.7 3.5 - 5.1 mmol/L   Chloride 105 101 - 111 mmol/L   CO2 30 22 - 32 mmol/L   Glucose, Bld 141 (H) 65 - 99 mg/dL   BUN 14 6 - 20 mg/dL   Creatinine, Ser 1.33 (H) 0.44 - 1.00 mg/dL   Calcium 9.1 8.9 - 10.3 mg/dL   GFR calc non Af Amer 41 (L) >60 mL/min   GFR calc Af Amer 48 (L) >60 mL/min   Anion gap 7 5 - 15  Glucose, capillary     Status: Abnormal   Collection Time: 11/12/15  6:24 AM  Result Value Ref Range   Glucose-Capillary 135 (H) 65 - 99 mg/dL   Comment 1 Notify RN    Comment 2 Document in Chart     Signed: Collier Salina, MD 11/12/2015, 12:12 PM

## 2015-11-12 NOTE — Progress Notes (Signed)
SLP Cancellation Note  Patient Details Name: [name] MRN: [id number] DOB: 02-12-1951   Cancelled treatment:       Reason Eval/Treat Not Completed: Other (comment) (pt going to ECHO lab, will continue efforts)   Macario Golds 11/12/2015, 8:50 AM  Luanna Salk, [place] [facility] SLP [phone]

## 2016-04-24 IMAGING — CT CT HEAD W/O CM
3 series · 18 of 30 positions shown, 19 images · non-contrast
Comparison: 04/16/2015

CLINICAL DATA: Altered mental status.  Slurred speech

EXAM:
CT HEAD WITHOUT CONTRAST
TECHNIQUE: Contiguous axial images were obtained from the base of the skull
through the vertex without intravenous contrast.

[Series 2: head without · axial · non-contrast · 0.38mm/px · z∈[-108,-33]mm · 3 of 31 slices shown, 4 images]
[im 8/31  brain]
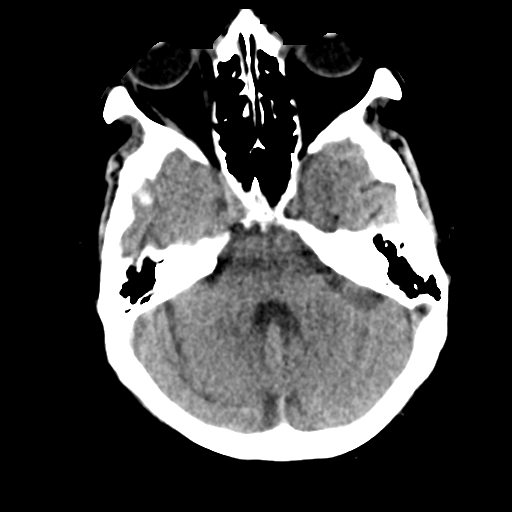
[im 8/31  bone]
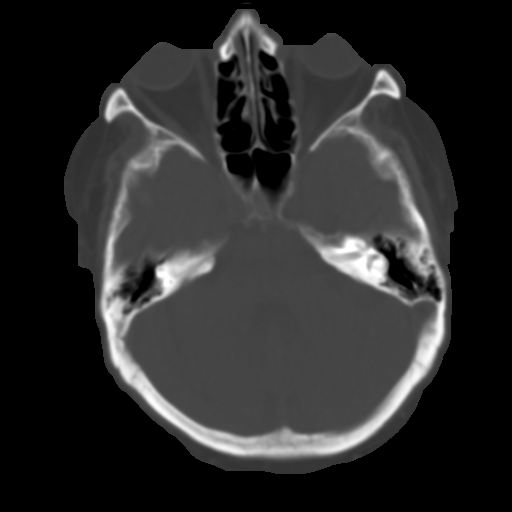
[im 16/31  brain]
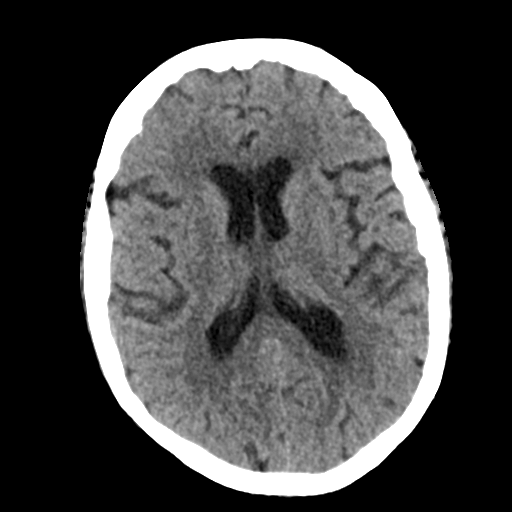
[im 23/31  brain]
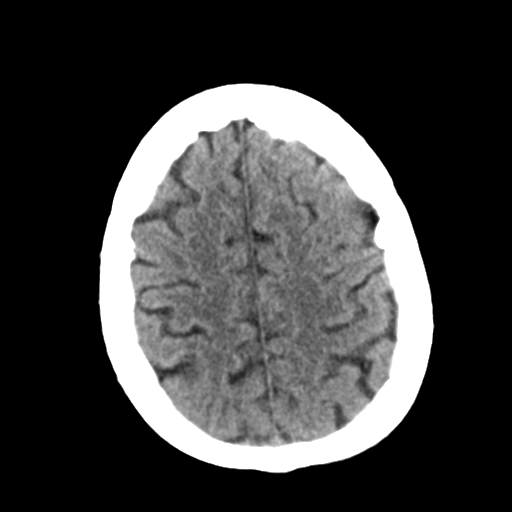

[Series 3: head bone · axial · 0.38mm/px · z∈[-131,-29]mm · 7 of 77 slices shown]
[im 7/77  bone]
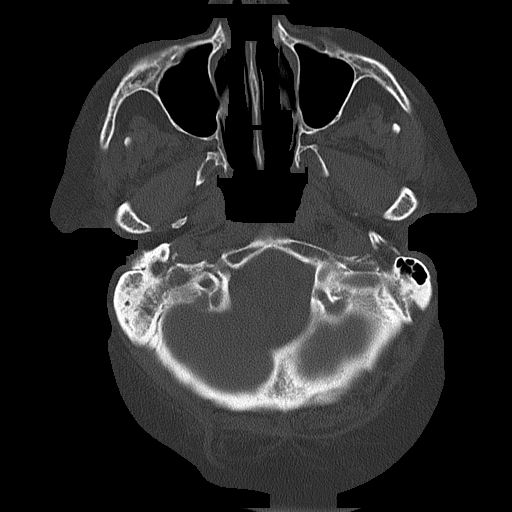
[im 20/77  bone]
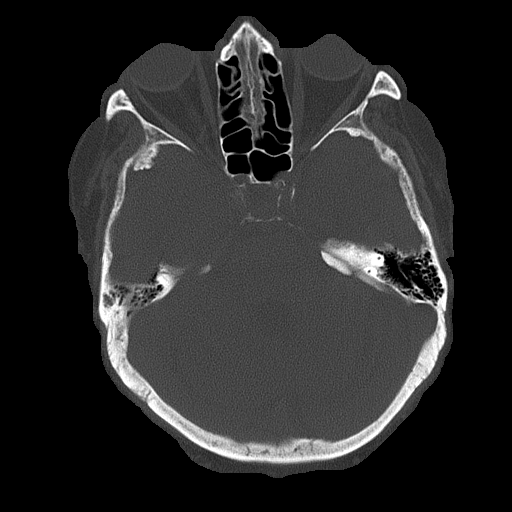
[im 26/77  bone]
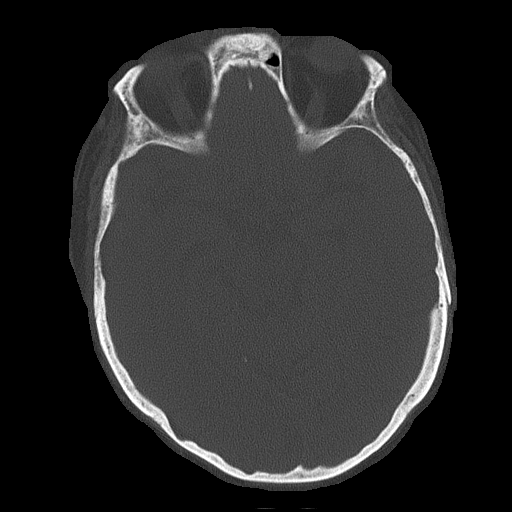
[im 32/77  bone]
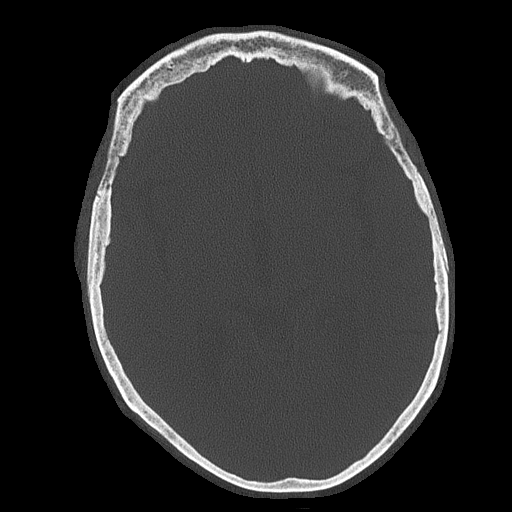
[im 45/77  bone]
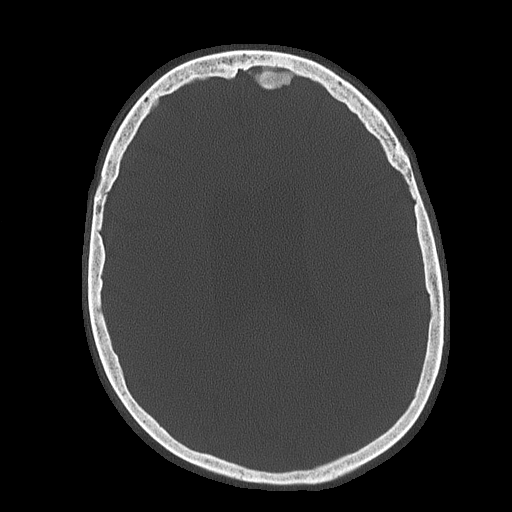
[im 51/77  bone]
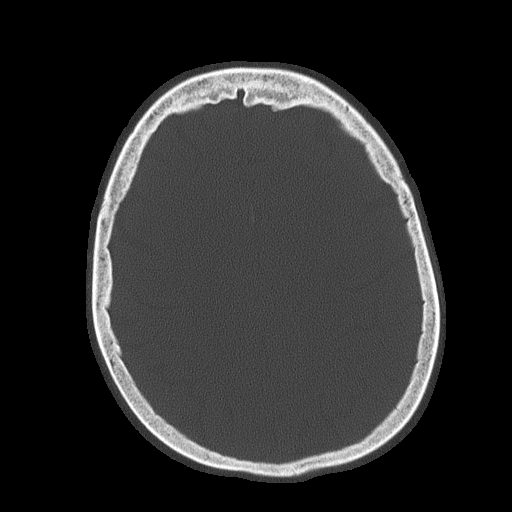
[im 58/77  bone]
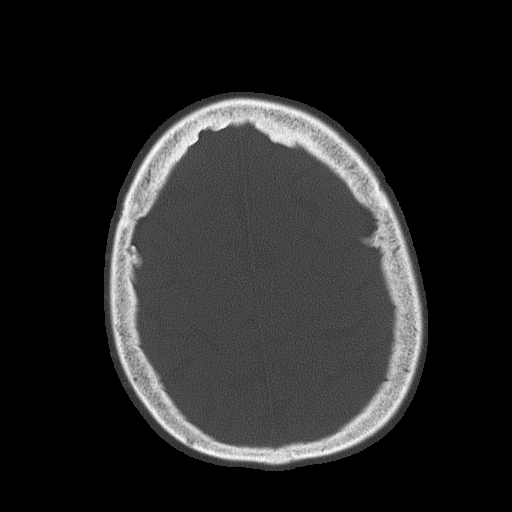

[Series 4: head without ax · axial · non-contrast · 0.38mm/px · z∈[-84,+12]mm · 8 of 74 slices shown]
[im 7/74  brain]
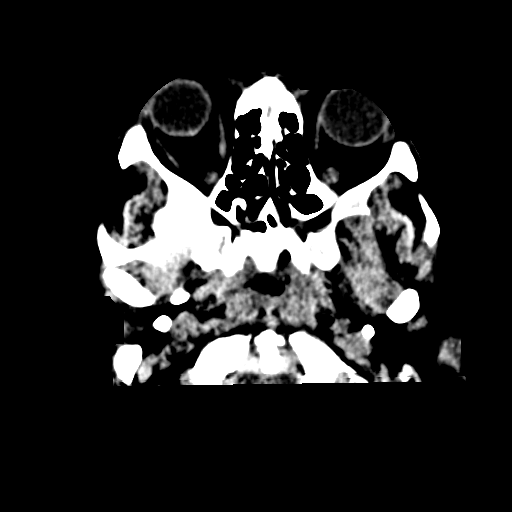
[im 14/74  brain]
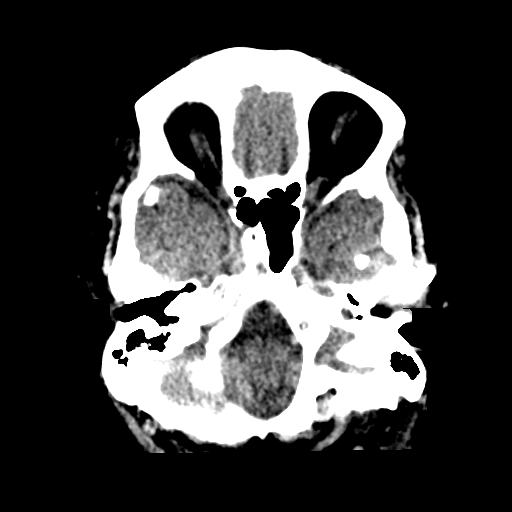
[im 27/74  brain]
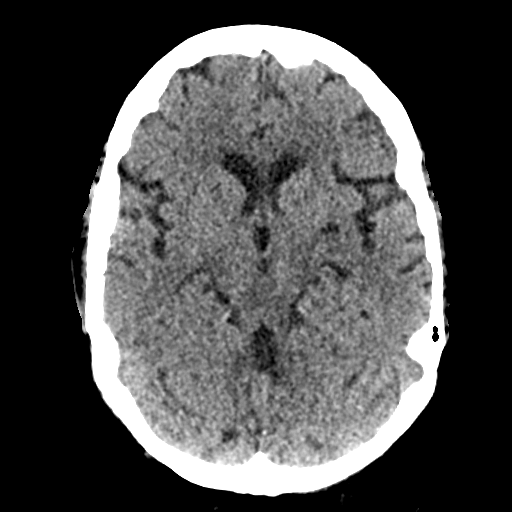
[im 34/74  brain]
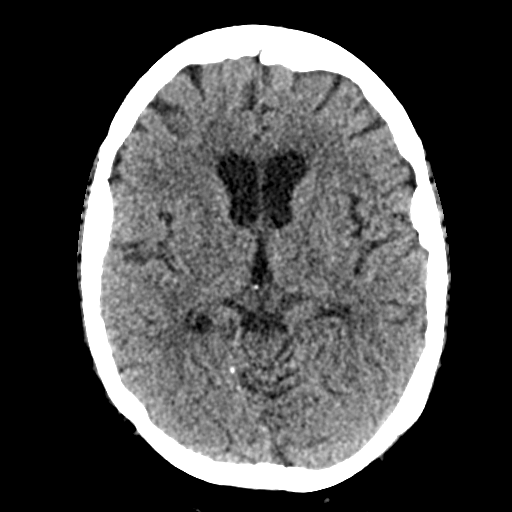
[im 40/74  brain]
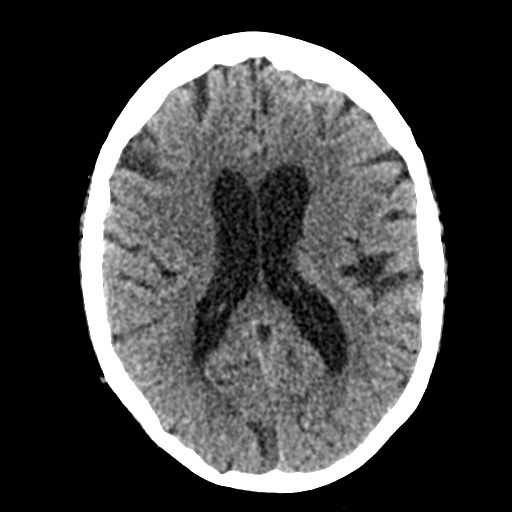
[im 47/74  brain]
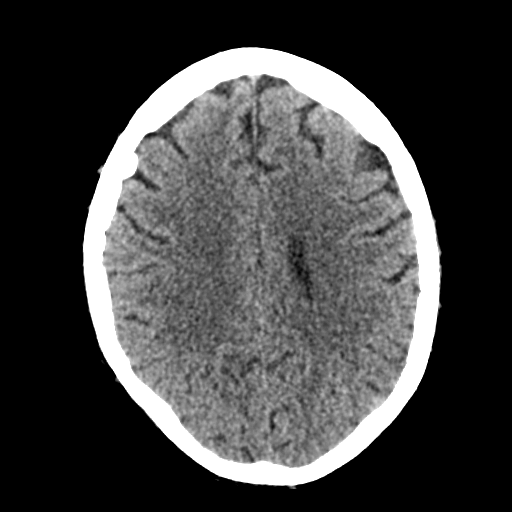
[im 60/74  brain]
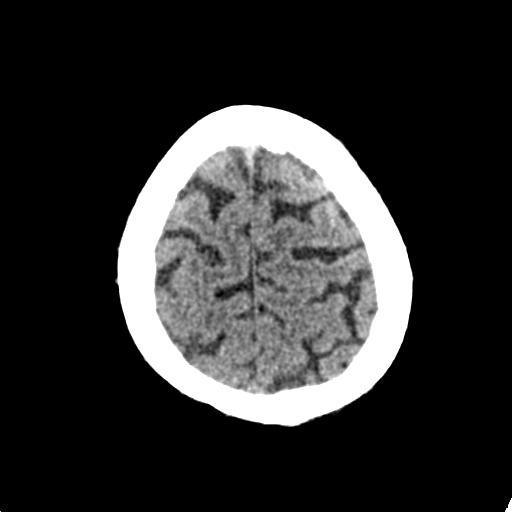
[im 67/74  brain]
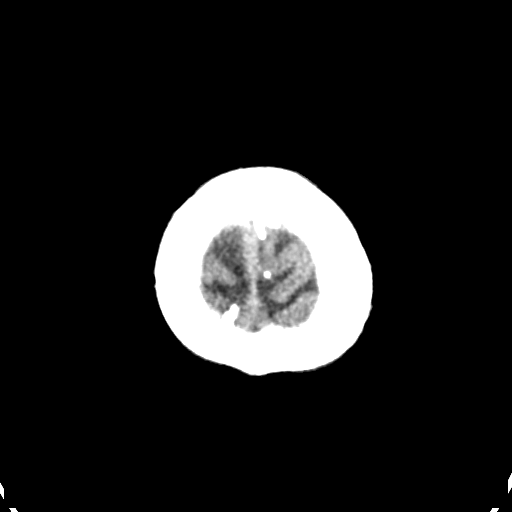

[18 of 30 positions shown; findings below may reference images not displayed]

FINDINGS: There is no evidence of mass effect, midline shift, or extra-axial
fluid collections. There is no evidence of a space-occupying lesion
or intracranial hemorrhage. There is no evidence of a cortical-based
area of acute infarction. There is generalized cerebral atrophy.
There is periventricular white matter low attenuation likely
secondary to microangiopathy.

The ventricles and sulci are appropriate for the patient's age. The
basal cisterns are patent.

Visualized portions of the orbits are unremarkable. The visualized
portions of the paranasal sinuses and mastoid air cells are
unremarkable. Cerebrovascular atherosclerotic calcifications are
noted.

The osseous structures are unremarkable.
IMPRESSION: 1. No acute intracranial pathology.

## 2016-04-25 IMAGING — MR MR HEAD W/O CM
9 of 10 series · 37 of 48 positions shown · non-contrast
Comparison: Prior study from 11/10/2015.

CLINICAL DATA: Initial evaluation for left-sided hemi paresis,
possible stroke.

EXAM:
MRI HEAD WITHOUT CONTRAST
TECHNIQUE: Multiplanar, multiecho pulse sequences of the brain and surrounding
structures were obtained without intravenous contrast.

[Series 3: T1 · sagittal · 5.0mm · 0.47mm/px · 3 of 23 slices shown]
[im 1/23]
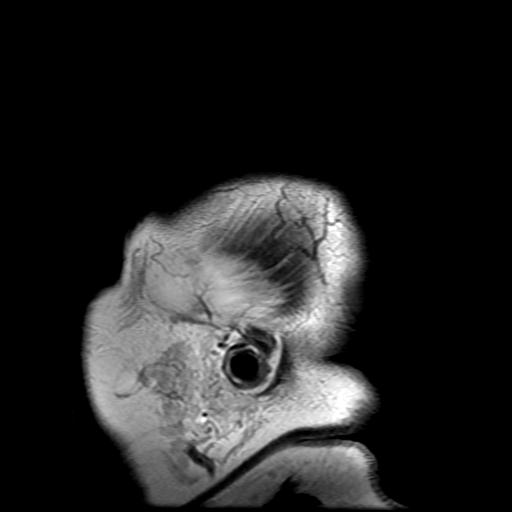
[im 12/23]
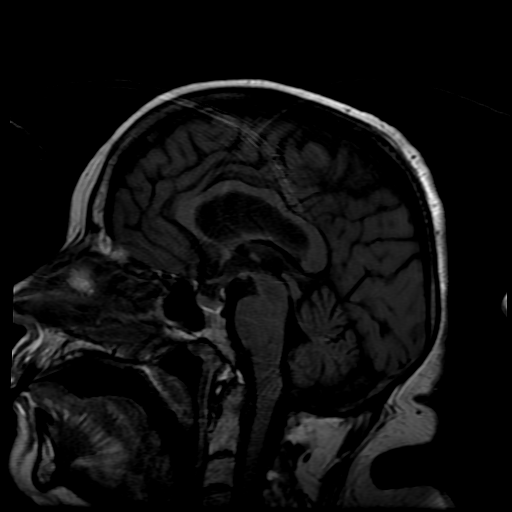
[im 23/23]
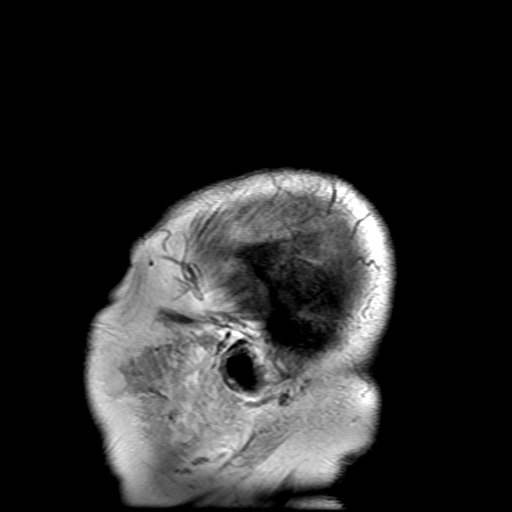

[Series 4: DWI · axial · 3.0mm · 1.09mm/px · z∈[-79,+55]mm · 11 of 96 slices shown (1 of 4)]
[im 1/96]
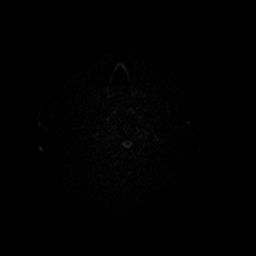
[im 10/96]
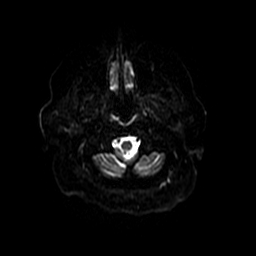
[im 20/96]
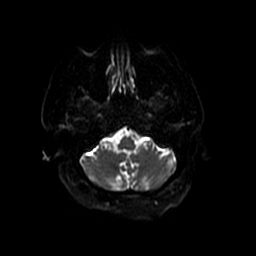
[im 29/96]
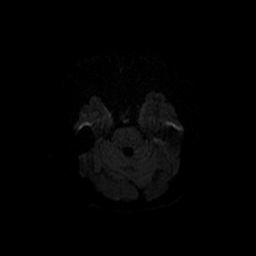
[im 39/96]
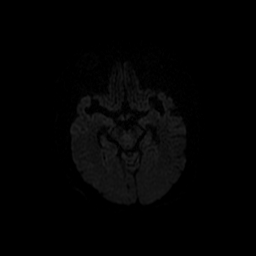
[im 48/96]
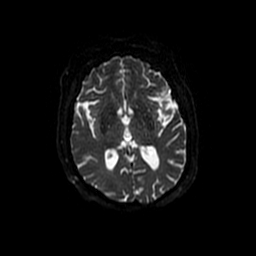
[im 58/96]
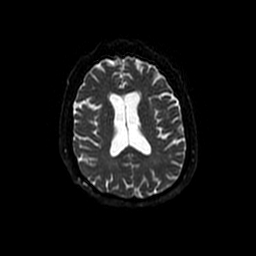
[im 67/96]
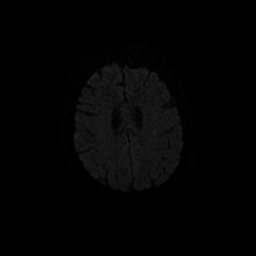
[im 77/96]
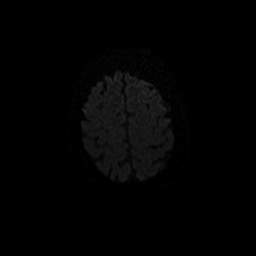
[im 86/96]
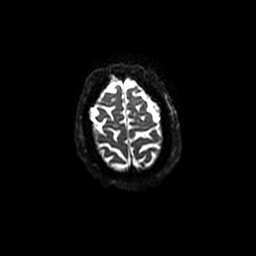
[im 96/96]
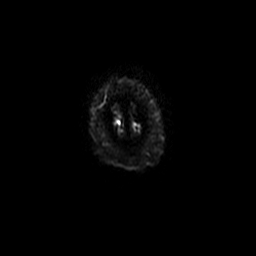

[Series 5: T2 · axial · 5.0mm · 0.43mm/px · z∈[-84,+47]mm · 2 of 24 slices shown (1 of 2)]
[im 1/24]
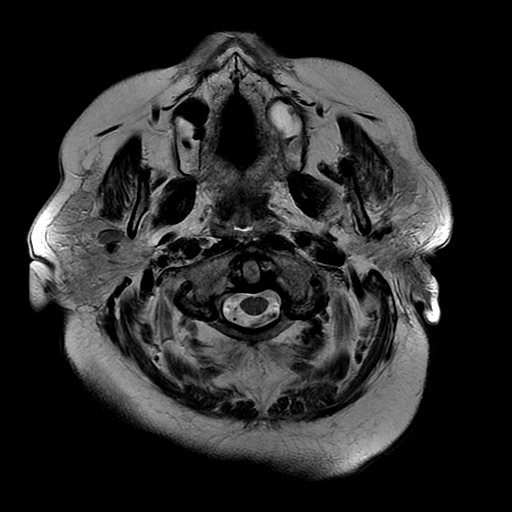
[im 24/24]
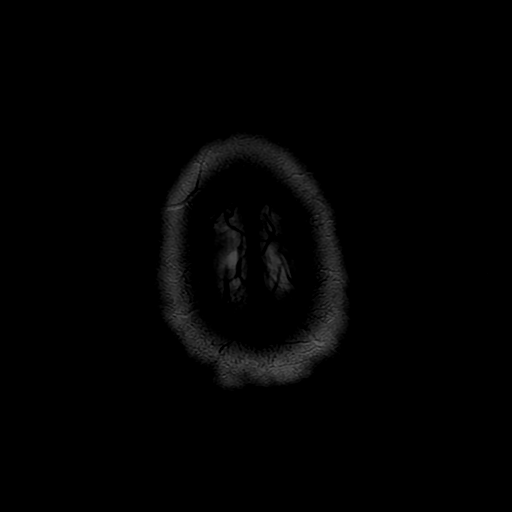

[Series 6: FLAIR · axial · 5.0mm · 0.43mm/px · z∈[-84,+47]mm · 2 of 24 slices shown]
[im 1/24]
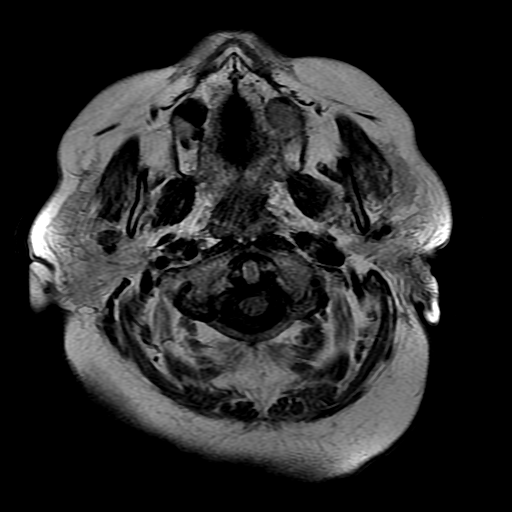
[im 24/24]
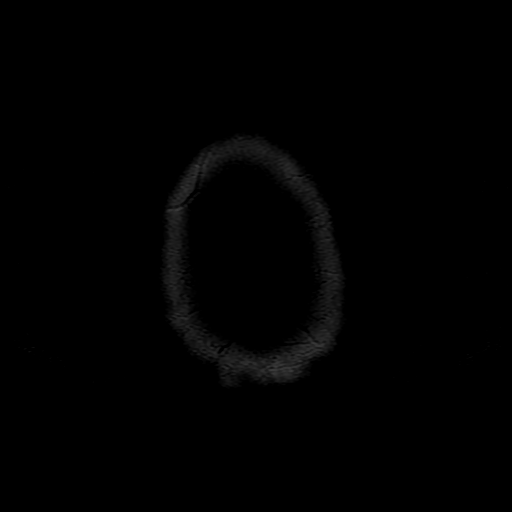

[Series 7: DWI · coronal · 5.0mm · 1.09mm/px · 7 of 68 slices shown (2 of 4)]
[im 1/68]
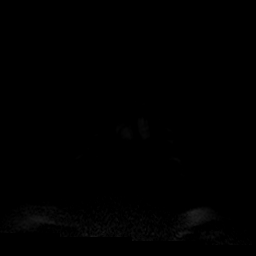
[im 12/68]
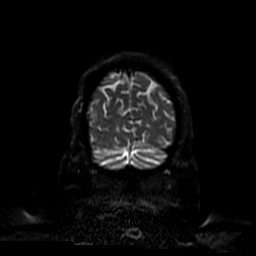
[im 23/68]
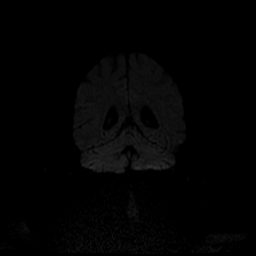
[im 34/68]
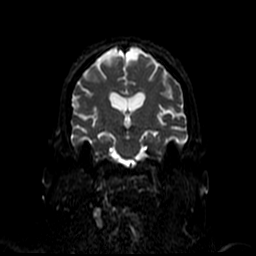
[im 45/68]
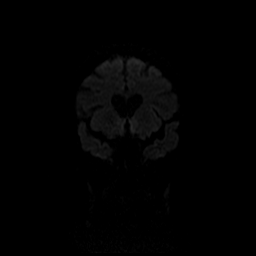
[im 56/68]
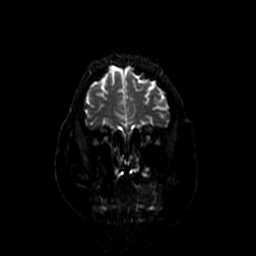
[im 68/68]
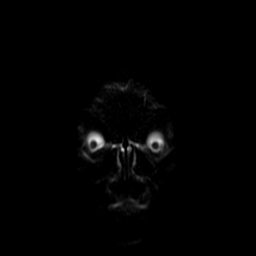

[Series 8: ax mpgr · axial · 5.0mm · 0.43mm/px · 1 of 24 slices shown]
[im 1/24]
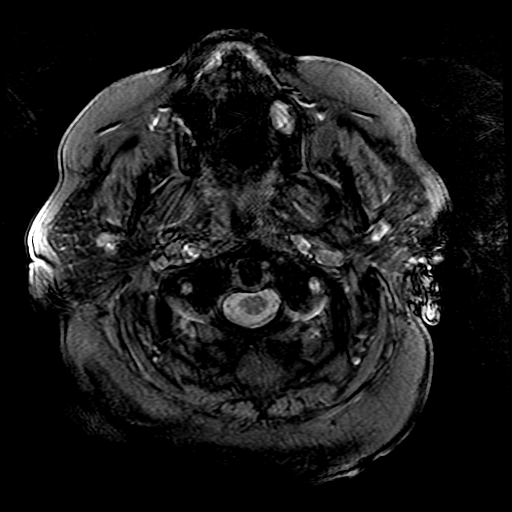

[Series 10: T2 · coronal · 5.0mm · 0.39mm/px · 3 of 26 slices shown (2 of 2)]
[im 1/26]
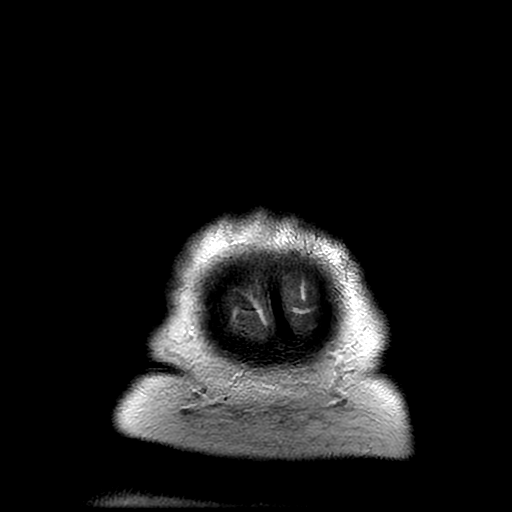
[im 13/26]
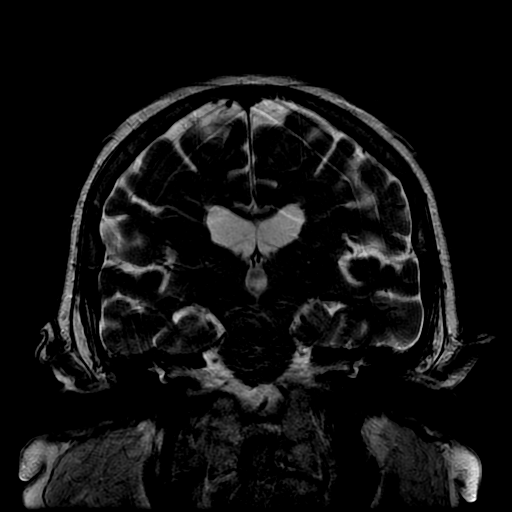
[im 26/26]
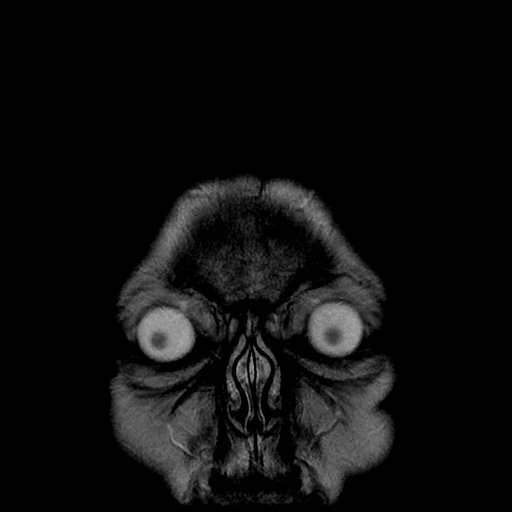

[Series 400: DWI · axial · 3.0mm · 1.09mm/px · z∈[-79,+55]mm · 5 of 48 slices shown (3 of 4)]
[im 1/48]
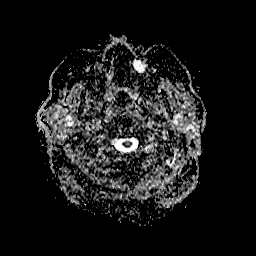
[im 12/48]
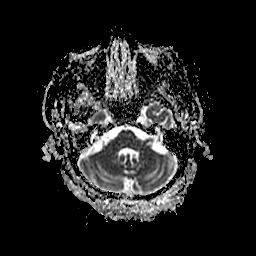
[im 24/48]
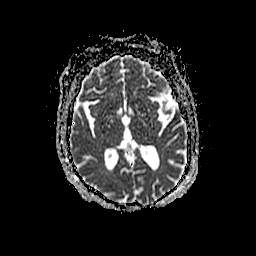
[im 36/48]
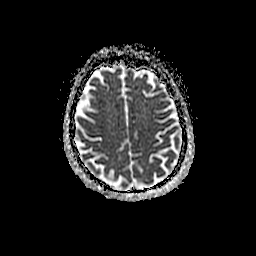
[im 48/48]
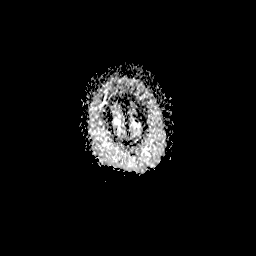

[Series 700: DWI · coronal · 5.0mm · 1.09mm/px · 3 of 33 slices shown (4 of 4)]
[im 1/33]
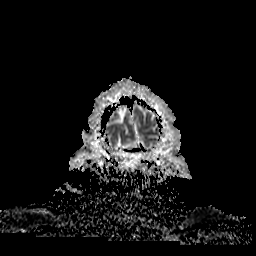
[im 17/33]
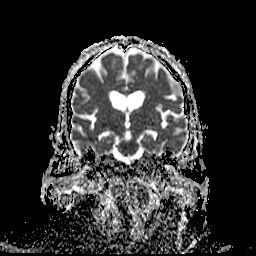
[im 33/33]
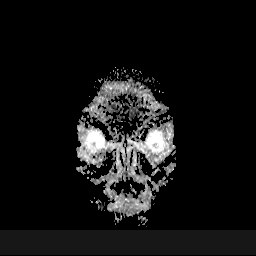

[37 of 48 positions shown; findings below may reference images not displayed]

FINDINGS: Cerebral volume within normal limits for patient age. Mild chronic
small vessel ischemic disease present within the periventricular and
deep white matter both cerebral hemispheres. Probable small remote
infarct present within the medial right cerebellar hemisphere
(series 9, image 20). No other areas of chronic infarction.

No abnormal foci of restricted diffusion to suggest acute infarct.
Gray-white matter differentiation well maintained. Major
intracranial vascular flow voids preserved. No acute or chronic
intracranial hemorrhage.

No mass lesion, midline shift, or mass effect. No hydrocephalus. No
extra-axial fluid collection. Major dural sinuses are grossly
patent.

Craniocervical junction within normal limits. Visualized upper
cervical spine unremarkable.

Pituitary gland within normal limits. No acute abnormality about the
orbits.

Mild mucosal thickening within the visualized paranasal sinuses.
Retention cysts within the left max O sinus partially visualized.
Small bilateral mastoid effusions, slightly larger on the right.
Inner ear structures normal.

Bone marrow signal intensity within normal limits. No scalp soft
tissue abnormality.
IMPRESSION: 1. No acute intracranial infarct or other process identified.
2. Probable small remote infarct within the medial right cerebellar
hemisphere.
3. Mild chronic small vessel ischemic disease.

## 2016-06-23 ENCOUNTER — Ambulatory Visit: Payer: Medicaid Other | Admitting: Podiatry

## 2016-06-24 ENCOUNTER — Encounter: Payer: Self-pay | Admitting: Cardiology

## 2016-06-24 NOTE — Progress Notes (Signed)
.    Cardiology Office Note  Date: 06/30/2016   ID: [name], DOB Dec 24, 1950, MRN [id number]  PCP: Kingsley Callander, FNP  Consulting Cardiologist: Rozann Lesches, MD   Chief Complaint  Patient presents with  . Cardiac evaluation    History of Present Illness: [name] is a medically complex 65 y.o. female referred for cardiology consultation by Ms. Eulas Post NP. I reviewed extensive records and updated the chart. She is here today with an assistant from the care home in which she now lives. It sounds like she is fairly sedentary, enjoys coloring, moving about the house with a walker. She sometimes experiences shortness of breath and uses a nebulizer for this. She does not report any chest pain or palpitations.  Records indicate prior follow-up with Dr. Aundra Dubin as of 7408 for diastolic heart failure and prior history of pericarditis. Last echocardiogram was in 2011, outlined below. I did review her recent ECG which is summarized below.  She does complain of chronic leg edema. Current medications reviewed. She is on Lasix at 20 mg daily. Recent lab work showed creatinine 1.6, normal potassium.  Past Medical History:  Diagnosis Date  . Anxiety   . Asthma   . Back pain   . Barrett esophagus   . CKD (chronic kidney disease) stage 3, GFR 30-59 ml/min   . Depression   . Diastolic heart failure ([place])   . Essential hypertension   . Fibromyalgia   . GERD (gastroesophageal reflux disease)   . Gout   . History of pericarditis   . Hyperlipidemia   . Nephrolithiasis    2008  . Obesity hypoventilation syndrome ([facility])    CPAP  . PE (pulmonary embolism)    2010  . Peripheral neuropathy ([place])   . Rheumatoid arthritis ([place])   . Schizophrenia ([place])   . Steroid dependence   . Type 2 diabetes mellitus ([place])   . Vitamin D deficiency     Past Surgical History:  Procedure Laterality Date  . APPENDECTOMY    . CHOLECYSTECTOMY    . COLONOSCOPY     benign polyps (12/2000), repeat  colonoscopy performed in 2007  . ENDOMETRIAL BIOPSY     10/03 benign columnar mucosa (limited material)  . KIDNEY SURGERY      Current Outpatient Prescriptions  Medication Sig Dispense Refill  . albuterol (PROVENTIL HFA;VENTOLIN HFA) 108 (90 Base) MCG/ACT inhaler Inhale 1 puff into the lungs every 4 (four) hours as needed for wheezing or shortness of breath. 1 Inhaler 0  . amLODipine (NORVASC) 10 MG tablet Take 10 mg by mouth daily.    . ARIPiprazole (ABILIFY) 15 MG tablet Take 15 mg by mouth daily.    [name] Kitchen aspirin 81 MG tablet Take 81 mg by mouth daily.    [name] Kitchen atorvastatin (LIPITOR) 20 MG tablet Take 20 mg by mouth daily.    [name] Kitchen docusate sodium (COLACE) 100 MG capsule Take 100 mg by mouth 2 (two) times daily.    [name] Kitchen EPINEPHrine (EPI-PEN) 0.3 mg/0.3 mL DEVI Inject 0.3 mg into the muscle once.    . furosemide (LASIX) 20 MG tablet Take 20 mg by mouth daily.    . insulin glargine (LANTUS) 100 UNIT/ML injection Inject 20 Units into the skin at bedtime.    . Insulin Syringes, Disposable, U-100 0.5 ML MISC Use as directed 100 each 6  . ipratropium (ATROVENT) 0.02 % nebulizer solution Take 0.5 mg by nebulization 4 (four) times daily.    [name] Kitchen lactulose (CHRONULAC) 10 GM/15ML solution  Take 30 g by mouth daily as needed for mild constipation.    . metFORMIN (GLUCOPHAGE) 500 MG tablet Take 1 tablet (500 mg total) by mouth 2 (two) times daily. 60 tablet 0  . Multiple Vitamin (MULTIVITAMIN WITH MINERALS) TABS tablet Take 1 tablet by mouth daily.    [name] Kitchen omeprazole (PRILOSEC) 20 MG capsule Take 20 mg by mouth 2 (two) times daily.     . traMADol (ULTRAM) 50 MG tablet Take 50 mg by mouth every 6 (six) hours as needed for moderate pain.    . traZODone (DESYREL) 100 MG tablet Take 50 mg by mouth at bedtime.      No current facility-administered medications for this visit.    Allergies:  Benztropine mesylate; Codeine; Diazepam; Diphenhydramine hcl; Lisinopril; Penicillins; Sulfonamide derivatives; and Thioridazine hcl    Social History: The patient  reports that she quit smoking about 44 years ago. Her smoking use included Cigarettes. She has never used smokeless tobacco. She reports that she does not drink alcohol or use drugs.   Family History: The patient's family history includes Bone cancer in her mother; COPD in her father; Heart disease in her father and mother; Hypertension in her mother; Stroke in her father.   ROS:  Please see the history of present illness. Otherwise, complete review of systems is positive for gait instability.  All other systems are reviewed and negative.   Physical Exam: VS:  BP (!) 197/72   Pulse 60   Ht 5' (1.524 m)   Wt 194 lb 3.2 oz (88.1 kg)   SpO2 100%   BMI 37.93 kg/m , BMI Body mass index is 37.93 kg/m.  Wt Readings from Last 3 Encounters:  06/30/16 194 lb 3.2 oz (88.1 kg)  11/11/15 201 lb 15.1 oz (91.6 kg)  11/08/13 156 lb (70.8 kg)    General: Overweight woman, appears comfortable at rest. HEENT: Conjunctiva and lids normal, oropharynx clear. Neck: Supple, no elevated JVP or carotid bruits, no thyromegaly. Lungs: Diminished breath sounds, nonlabored breathing at rest. Cardiac: Regular rate and rhythm, no S3, 2/6 systolic murmur, no pericardial rub. Abdomen: Soft, nontender, bowel sounds present. Extremities: Chronic appearing firm lower leg edema and stasis, distal pulses 1-2+. Skin: Warm and dry. Musculoskeletal: No kyphosis. Neuropsychiatric: Alert and oriented x3, affect grossly appropriate.  ECG: I personally reviewed the tracing from 06/02/2016 which shows probable sinus rhythm with right bundle branch block and left anterior fascicular block.  Recent Labwork: 11/10/2015: ALT 18; AST 23; Hemoglobin 10.5; Platelets 191 11/12/2015: BUN 14; Creatinine, Ser 1.33; Potassium 3.7; Sodium 142  October 2017: Hemoglobin 10.8, platelets 185, BUN 27, creatinine 1.6, potassium 4.5, AST 18, ALT 23, cholesterol 207, triglycerides 174, HDL 44, LDL 128, hemoglobin  A1c 6.1  Other Studies Reviewed Today:  Echocardiogram 08/29/2009: Study Conclusions  - Left ventricle: The cavity size was normal. Systolic function was  normal. The estimated ejection fraction was in the range of 55% to  60%. Regional wall motion abnormalities cannot be excluded.  Doppler parameters are consistent with abnormal left ventricular  relaxation (grade 1 diastolic dysfunction). - Left atrium: The atrium was mildly dilated. - Pulmonary arteries: Systolic pressure was mildly increased.  Assessment and Plan:  1. History of diastolic heart failure based on previous workup. She does have fairly chronic leg edema and is on a low-dose diuretic. Recent creatinine 1.6 with normal potassium. Plan is to obtain an echocardiogram to follow up on cardiac structural evaluation. May need further medication adjustments including an increase in  Lasix or possibly switch to Demadex. Might be able to tolerate a low-dose ARB although would have to be careful with her renal insufficiency.  2. History of CKD stage 3.  3. Essential hypertension. Blood pressure significantly elevated at presentation, systolic down to 211 on recheck.  4. History of pericarditis, no obvious recurrences based on available information.  Current medicines were reviewed with the patient today.   Orders Placed This Encounter  Procedures  . ECHOCARDIOGRAM COMPLETE    Disposition: Call with results, Follow-up in 6 months.  Signed, Satira Sark, MD, [facility] 06/30/2016 1:58 PM    [facility] [place], [place], [place] [address] Phone: [phone]; Fax: [phone]

## 2016-06-30 ENCOUNTER — Ambulatory Visit (INDEPENDENT_AMBULATORY_CARE_PROVIDER_SITE_OTHER): Payer: Medicaid Other | Admitting: Cardiology

## 2016-06-30 ENCOUNTER — Encounter: Payer: Self-pay | Admitting: Cardiology

## 2016-06-30 VITALS — BP 197/72 | HR 60 | Ht 60.0 in | Wt 194.2 lb

## 2016-06-30 DIAGNOSIS — Z8679 Personal history of other diseases of the circulatory system: Secondary | ICD-10-CM | POA: Diagnosis not present

## 2016-06-30 DIAGNOSIS — I5032 Chronic diastolic (congestive) heart failure: Secondary | ICD-10-CM

## 2016-06-30 DIAGNOSIS — N183 Chronic kidney disease, stage 3 unspecified: Secondary | ICD-10-CM

## 2016-06-30 DIAGNOSIS — I10 Essential (primary) hypertension: Secondary | ICD-10-CM

## 2016-06-30 NOTE — Patient Instructions (Signed)
Your physician wants you to follow-up in: 6 MONTHS WITH DR. MCDOWELL You will receive a reminder letter in the mail two months in advance. If you don't receive a letter, please call our office to schedule the follow-up appointment.  Your physician recommends that you continue on your current medications as directed. Please refer to the Current Medication list given to you today.  Your physician has requested that you have an echocardiogram. Echocardiography is a painless test that uses sound waves to create images of your heart. It provides your doctor with information about the size and shape of your heart and how well your heart's chambers and valves are working. This procedure takes approximately one hour. There are no restrictions for this procedure.  Thank you for choosing [facility]!!    

## 2016-07-14 ENCOUNTER — Other Ambulatory Visit: Payer: Self-pay

## 2016-07-14 ENCOUNTER — Ambulatory Visit (INDEPENDENT_AMBULATORY_CARE_PROVIDER_SITE_OTHER): Payer: Medicaid Other

## 2016-07-14 ENCOUNTER — Ambulatory Visit (INDEPENDENT_AMBULATORY_CARE_PROVIDER_SITE_OTHER): Payer: Medicaid Other | Admitting: Podiatry

## 2016-07-14 ENCOUNTER — Encounter: Payer: Self-pay | Admitting: Podiatry

## 2016-07-14 ENCOUNTER — Telehealth: Payer: Self-pay | Admitting: *Deleted

## 2016-07-14 DIAGNOSIS — I5032 Chronic diastolic (congestive) heart failure: Secondary | ICD-10-CM

## 2016-07-14 DIAGNOSIS — L97501 Non-pressure chronic ulcer of other part of unspecified foot limited to breakdown of skin: Secondary | ICD-10-CM | POA: Diagnosis not present

## 2016-07-14 DIAGNOSIS — L98491 Non-pressure chronic ulcer of skin of other sites limited to breakdown of skin: Secondary | ICD-10-CM

## 2016-07-14 NOTE — Telephone Encounter (Signed)
Patient informed and copy sent to PCP. 

## 2016-07-14 NOTE — Progress Notes (Signed)
   Subjective:    Patient ID: [name], female    DOB: [birth year], 65 y.o.   MRN: [id number]  HPI    This patient presents today for follow-up care for a superficial ulceration on the right first MPJ with initial measuring 0.5 cm x 0.3 cm by Dr. Cannon Kettle on 06/19/2015. Patient was instructed to apply topical antibiotic ointment to the area daily also patient was advised to present to ER if she knows any significant increase symptoms in the area. Patient is a known diabetic with neuropathy. Patient has transferred to  residential facility. Facility representatives present in the treatment room     Review of Systems  All other systems reviewed and are negative.      Objective:   Physical Exam  Patient appears to be able to answer questions  DP pulses 2/4 bilaterally PT pulses 2/4 bilaterally Capillary reflex. Within normal limits bilaterally No peripheral edema bilaterally Sensation to 10 g monofilament wire intact 0/5 bilaterally bilaterally Vibratory sensation nonreactive bilaterally Ankle reflexes reactive bilaterally Superficial 3 mm granuloma ulcer surrounded by hyperkeratotic tissue plantar right first MPJ. 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 Mallet toe third left Limited range of motion subtalar midtarsal joints bilaterally Patient has unstable gait      Assessment & Plan:   Assessment: Noninfected superficial ulcer plantar first MPJ right  Plan: Debride superficial ulcer plantar right first MPJ and apply topical antibiotic ointment and gauze dressing Attach felt pad to offload plantar right first MPJ and patient's athletic style shoe Deferred on dispensing surgical shoe because of unstable gait, to avoid falls Instructed patient apply triple antibiotic ointment daily and a pad to the area and wear the athletic style shoe. Informed patient and representative from  assisted-living if notice any sudden increase of pain, swelling, redness, fever present to emergency department  Reappoint 2 weeks   Diabetic peripheral neuropathy

## 2016-07-14 NOTE — Patient Instructions (Signed)
Apply triple antibiotic ointment daily and cover with a gauze pad to the skin ulcer on the bottom of the right foot daily Wear the athletic shoe with additional felt pad attached to offload the skin ulcer on the ball of first MPJ right If you notice any sudden increase in pain, swelling, redness presents to emergency department Reappoint 2 weeks Gean Birchwood DPM [facility] 07/14/2016    Diabetes and Foot Care Diabetes may cause you to have problems because of poor blood supply (circulation) to your feet and legs. This may cause the skin on your feet to become thinner, break easier, and heal more slowly. Your skin may become dry, and the skin may peel and crack. You may also have nerve damage in your legs and feet causing decreased feeling in them. You may not notice minor injuries to your feet that could lead to infections or more serious problems. Taking care of your feet is one of the most important things you can do for yourself. Follow these instructions at home:  Wear shoes at all times, even in the house. Do not go barefoot. Bare feet are easily injured.  Check your feet daily for blisters, cuts, and redness. If you cannot see the bottom of your feet, use a mirror or ask someone for help.  Wash your feet with warm water (do not use hot water) and mild soap. Then pat your feet and the areas between your toes until they are completely dry. Do not soak your feet as this can dry your skin.  Apply a moisturizing lotion or petroleum jelly (that does not contain alcohol and is unscented) to the skin on your feet and to dry, brittle toenails. Do not apply lotion between your toes.  Trim your toenails straight across. Do not dig under them or around the cuticle. File the edges of your nails with an emery board or nail file.  Do not cut corns or calluses or try to remove them with medicine.  Wear clean socks or stockings every day. Make sure they are not too tight. Do not wear  knee-high stockings since they may decrease blood flow to your legs.  Wear shoes that fit properly and have enough cushioning. To break in new shoes, wear them for just a few hours a day. This prevents you from injuring your feet. Always look in your shoes before you put them on to be sure there are no objects inside.  Do not cross your legs. This may decrease the blood flow to your feet.  If you find a minor scrape, cut, or break in the skin on your feet, keep it and the skin around it clean and dry. These areas may be cleansed with mild soap and water. Do not cleanse the area with peroxide, alcohol, or iodine.  When you remove an adhesive bandage, be sure not to damage the skin around it.  If you have a wound, look at it several times a day to make sure it is healing.  Do not use heating pads or hot water bottles. They may burn your skin. If you have lost feeling in your feet or legs, you may not know it is happening until it is too late.  Make sure your health care provider performs a complete foot exam at least annually or more often if you have foot problems. Report any cuts, sores, or bruises to your health care provider immediately. Contact a health care provider if:  You have an injury  that is not healing.  You have cuts or breaks in the skin.  You have an ingrown nail.  You notice redness on your legs or feet.  You feel burning or tingling in your legs or feet.  You have pain or cramps in your legs and feet.  Your legs or feet are numb.  Your feet always feel cold. Get help right away if:  There is increasing redness, swelling, or pain in or around a wound.  There is a red line that goes up your leg.  Pus is coming from a wound.  You develop a fever or as directed by your health care provider.  You notice a bad smell coming from an ulcer or wound. This information is not intended to replace advice given to you by your health care provider. Make sure you discuss any  questions you have with your health care provider. Document Released: 07/30/2000 Document Revised: 01/08/2016 Document Reviewed: 01/09/2013 Elsevier Interactive Patient Education  2017 [employer].

## 2016-07-14 NOTE — Telephone Encounter (Signed)
-----   Message from Satira Sark, MD sent at 07/14/2016  4:59 PM EST ----- Results reviewed. LVEF remains normal range and she has mild diastolic dysfunction. Normal right ventricular contraction. Would continue with current diuretic plan. If leg edema worsens we can always consider further advancing her Lasix dose or switching to Demadex. A copy of this test should be forwarded to Kingsley Callander, FNP.

## 2016-08-11 ENCOUNTER — Observation Stay (HOSPITAL_COMMUNITY)
Admission: AD | Admit: 2016-08-11 | Discharge: 2016-08-13 | Disposition: A | Discharge status: Home / Self Care | Payer: Medicaid Other | Source: Other Acute Inpatient Hospital | Attending: Internal Medicine | Admitting: Internal Medicine

## 2016-08-11 ENCOUNTER — Ambulatory Visit (HOSPITAL_COMMUNITY): Payer: Medicaid Other | Attending: Emergency Medicine

## 2016-08-11 ENCOUNTER — Other Ambulatory Visit (HOSPITAL_COMMUNITY): Payer: Self-pay | Admitting: Emergency Medicine

## 2016-08-11 DIAGNOSIS — N184 Chronic kidney disease, stage 4 (severe): Secondary | ICD-10-CM | POA: Diagnosis present

## 2016-08-11 DIAGNOSIS — R079 Chest pain, unspecified: Principal | ICD-10-CM | POA: Insufficient documentation

## 2016-08-11 DIAGNOSIS — I5030 Unspecified diastolic (congestive) heart failure: Secondary | ICD-10-CM | POA: Insufficient documentation

## 2016-08-11 DIAGNOSIS — E1122 Type 2 diabetes mellitus with diabetic chronic kidney disease: Secondary | ICD-10-CM | POA: Insufficient documentation

## 2016-08-11 DIAGNOSIS — I130 Hypertensive heart and chronic kidney disease with heart failure and stage 1 through stage 4 chronic kidney disease, or unspecified chronic kidney disease: Secondary | ICD-10-CM | POA: Insufficient documentation

## 2016-08-11 DIAGNOSIS — E1140 Type 2 diabetes mellitus with diabetic neuropathy, unspecified: Secondary | ICD-10-CM | POA: Diagnosis not present

## 2016-08-11 DIAGNOSIS — J45909 Unspecified asthma, uncomplicated: Secondary | ICD-10-CM | POA: Diagnosis not present

## 2016-08-11 DIAGNOSIS — D649 Anemia, unspecified: Secondary | ICD-10-CM | POA: Diagnosis present

## 2016-08-11 DIAGNOSIS — I4510 Unspecified right bundle-branch block: Secondary | ICD-10-CM | POA: Diagnosis present

## 2016-08-11 DIAGNOSIS — I5032 Chronic diastolic (congestive) heart failure: Secondary | ICD-10-CM | POA: Diagnosis present

## 2016-08-11 DIAGNOSIS — Z7982 Long term (current) use of aspirin: Secondary | ICD-10-CM | POA: Diagnosis not present

## 2016-08-11 DIAGNOSIS — I10 Essential (primary) hypertension: Secondary | ICD-10-CM | POA: Diagnosis present

## 2016-08-11 DIAGNOSIS — M79606 Pain in leg, unspecified: Secondary | ICD-10-CM

## 2016-08-11 DIAGNOSIS — Z794 Long term (current) use of insulin: Secondary | ICD-10-CM

## 2016-08-11 DIAGNOSIS — R0789 Other chest pain: Secondary | ICD-10-CM

## 2016-08-11 DIAGNOSIS — N183 Chronic kidney disease, stage 3 (moderate): Secondary | ICD-10-CM | POA: Diagnosis not present

## 2016-08-11 DIAGNOSIS — Z86711 Personal history of pulmonary embolism: Secondary | ICD-10-CM | POA: Diagnosis present

## 2016-08-11 DIAGNOSIS — I5033 Acute on chronic diastolic (congestive) heart failure: Secondary | ICD-10-CM | POA: Diagnosis present

## 2016-08-11 DIAGNOSIS — R7989 Other specified abnormal findings of blood chemistry: Secondary | ICD-10-CM | POA: Diagnosis present

## 2016-08-11 DIAGNOSIS — Z79899 Other long term (current) drug therapy: Secondary | ICD-10-CM | POA: Diagnosis not present

## 2016-08-11 DIAGNOSIS — R0602 Shortness of breath: Secondary | ICD-10-CM

## 2016-08-11 DIAGNOSIS — E118 Type 2 diabetes mellitus with unspecified complications: Secondary | ICD-10-CM

## 2016-08-11 DIAGNOSIS — I872 Venous insufficiency (chronic) (peripheral): Secondary | ICD-10-CM | POA: Diagnosis present

## 2016-08-11 DIAGNOSIS — Z87891 Personal history of nicotine dependence: Secondary | ICD-10-CM | POA: Insufficient documentation

## 2016-08-11 DIAGNOSIS — E1142 Type 2 diabetes mellitus with diabetic polyneuropathy: Secondary | ICD-10-CM

## 2016-08-11 DIAGNOSIS — F209 Schizophrenia, unspecified: Secondary | ICD-10-CM | POA: Diagnosis present

## 2016-08-11 DIAGNOSIS — M79669 Pain in unspecified lower leg: Secondary | ICD-10-CM

## 2016-08-11 DIAGNOSIS — IMO0001 Reserved for inherently not codable concepts without codable children: Secondary | ICD-10-CM

## 2016-08-11 MED ORDER — MORPHINE SULFATE (PF) 2 MG/ML IV SOLN
2.0000 mg | Freq: Once | INTRAVENOUS | Status: AC
  Administered 2016-08-12: 2 mg via INTRAVENOUS
  Filled 2016-08-11: qty 1

## 2016-08-11 MED ORDER — NITROGLYCERIN 2 % TD OINT
0.5000 [in_us] | TOPICAL_OINTMENT | Freq: Four times a day (QID) | TRANSDERMAL | Status: DC
  Administered 2016-08-12 (×2): 0.5 [in_us] via TOPICAL
  Filled 2016-08-11: qty 30

## 2016-08-12 ENCOUNTER — Other Ambulatory Visit (HOSPITAL_COMMUNITY): Payer: Medicaid Other

## 2016-08-12 ENCOUNTER — Observation Stay (HOSPITAL_BASED_OUTPATIENT_CLINIC_OR_DEPARTMENT_OTHER): Payer: Medicaid Other

## 2016-08-12 ENCOUNTER — Observation Stay (HOSPITAL_COMMUNITY): Payer: Medicaid Other

## 2016-08-12 DIAGNOSIS — R071 Chest pain on breathing: Secondary | ICD-10-CM

## 2016-08-12 DIAGNOSIS — I5032 Chronic diastolic (congestive) heart failure: Secondary | ICD-10-CM | POA: Diagnosis not present

## 2016-08-12 DIAGNOSIS — I130 Hypertensive heart and chronic kidney disease with heart failure and stage 1 through stage 4 chronic kidney disease, or unspecified chronic kidney disease: Secondary | ICD-10-CM | POA: Diagnosis not present

## 2016-08-12 DIAGNOSIS — J45909 Unspecified asthma, uncomplicated: Secondary | ICD-10-CM | POA: Diagnosis not present

## 2016-08-12 DIAGNOSIS — I4510 Unspecified right bundle-branch block: Secondary | ICD-10-CM | POA: Diagnosis present

## 2016-08-12 DIAGNOSIS — R7989 Other specified abnormal findings of blood chemistry: Secondary | ICD-10-CM | POA: Diagnosis present

## 2016-08-12 DIAGNOSIS — E662 Morbid (severe) obesity with alveolar hypoventilation: Secondary | ICD-10-CM | POA: Diagnosis not present

## 2016-08-12 DIAGNOSIS — Z86711 Personal history of pulmonary embolism: Secondary | ICD-10-CM | POA: Diagnosis present

## 2016-08-12 DIAGNOSIS — N183 Chronic kidney disease, stage 3 (moderate): Secondary | ICD-10-CM | POA: Diagnosis not present

## 2016-08-12 DIAGNOSIS — R079 Chest pain, unspecified: Secondary | ICD-10-CM | POA: Diagnosis not present

## 2016-08-12 DIAGNOSIS — R0789 Other chest pain: Secondary | ICD-10-CM | POA: Diagnosis not present

## 2016-08-12 DIAGNOSIS — M79669 Pain in unspecified lower leg: Secondary | ICD-10-CM | POA: Diagnosis not present

## 2016-08-12 DIAGNOSIS — M79606 Pain in leg, unspecified: Secondary | ICD-10-CM

## 2016-08-12 DIAGNOSIS — I5030 Unspecified diastolic (congestive) heart failure: Secondary | ICD-10-CM | POA: Diagnosis not present

## 2016-08-12 DIAGNOSIS — I10 Essential (primary) hypertension: Secondary | ICD-10-CM

## 2016-08-12 DIAGNOSIS — N184 Chronic kidney disease, stage 4 (severe): Secondary | ICD-10-CM | POA: Diagnosis present

## 2016-08-12 LAB — PROTIME-INR
INR: 1.09
Prothrombin Time: 14.1 seconds (ref 11.4–15.2)

## 2016-08-12 LAB — GLUCOSE, CAPILLARY
Glucose-Capillary: 122 mg/dL — ABNORMAL HIGH (ref 65–99)
Glucose-Capillary: 139 mg/dL — ABNORMAL HIGH (ref 65–99)
Glucose-Capillary: 165 mg/dL — ABNORMAL HIGH (ref 65–99)
Glucose-Capillary: 114 mg/dL — ABNORMAL HIGH (ref 65–99)

## 2016-08-12 LAB — TROPONIN I
Troponin I: <0.03 ng/mL (ref <0.03)
Troponin I: <0.03 ng/mL (ref <0.03)
Troponin I: <0.03 ng/mL (ref <0.03)
Troponin I: <0.03 ng/mL (ref <0.03)

## 2016-08-12 LAB — CBC
HCT: 27.2 % — ABNORMAL LOW (ref 36.0–46.0)
Hemoglobin: 9.5 g/dL — ABNORMAL LOW (ref 12.0–15.0)
MCH: 32 pg (ref 26.0–34.0)
MCHC: 34.9 g/dL (ref 30.0–36.0)
MCV: 91.6 fL (ref 78.0–100.0)
Platelets: 159 10*3/uL (ref 150–400)
RBC: 2.97 MIL/uL — ABNORMAL LOW (ref 3.87–5.11)
RDW: 13.2 % (ref 11.5–15.5)
WBC: 8.3 10*3/uL (ref 4.0–10.5)

## 2016-08-12 LAB — BASIC METABOLIC PANEL
Anion gap: 8 (ref 5–15)
BUN: 32 mg/dL — ABNORMAL HIGH (ref 6–20)
CO2: 25 mmol/L (ref 22–32)
Calcium: 8.8 mg/dL — ABNORMAL LOW (ref 8.9–10.3)
Chloride: 109 mmol/L (ref 101–111)
Creatinine, Ser: 1.62 mg/dL — ABNORMAL HIGH (ref 0.44–1.00)
GFR calc Af Amer: 38 mL/min — ABNORMAL LOW (ref >60)
GFR calc non Af Amer: 33 mL/min — ABNORMAL LOW (ref >60)
Glucose, Bld: 101 mg/dL — ABNORMAL HIGH (ref 65–99)
Potassium: 3.9 mmol/L (ref 3.5–5.1)
Sodium: 142 mmol/L (ref 135–145)

## 2016-08-12 LAB — MAGNESIUM: Magnesium: 2.1 mg/dL (ref 1.7–2.4)

## 2016-08-12 LAB — APTT: aPTT: 33 seconds (ref 24–36)

## 2016-08-12 LAB — BRAIN NATRIURETIC PEPTIDE: B Natriuretic Peptide: 176.6 pg/mL — ABNORMAL HIGH (ref 0.0–100.0)

## 2016-08-12 LAB — MRSA PCR SCREENING: MRSA by PCR: POSITIVE — AB

## 2016-08-12 MED ORDER — PANTOPRAZOLE SODIUM 40 MG PO TBEC
40.0000 mg | DELAYED_RELEASE_TABLET | Freq: Every day | ORAL | Status: DC
  Administered 2016-08-12 – 2016-08-13 (×2): 40 mg via ORAL
  Filled 2016-08-12 (×2): qty 1

## 2016-08-12 MED ORDER — ASPIRIN EC 81 MG PO TBEC
81.0000 mg | DELAYED_RELEASE_TABLET | Freq: Every day | ORAL | Status: DC
  Administered 2016-08-12 – 2016-08-13 (×2): 81 mg via ORAL
  Filled 2016-08-12 (×2): qty 1

## 2016-08-12 MED ORDER — RISPERIDONE 1 MG PO TABS
1.0000 mg | ORAL_TABLET | Freq: Every day | ORAL | Status: DC
  Administered 2016-08-12: 1 mg via ORAL
  Filled 2016-08-12: qty 1

## 2016-08-12 MED ORDER — FUROSEMIDE 20 MG PO TABS
20.0000 mg | ORAL_TABLET | Freq: Every day | ORAL | Status: DC
  Administered 2016-08-12 – 2016-08-13 (×2): 20 mg via ORAL
  Filled 2016-08-12 (×2): qty 1

## 2016-08-12 MED ORDER — TECHNETIUM TC 99M DIETHYLENETRIAME-PENTAACETIC ACID: 32.0000 | Freq: Once | INTRAVENOUS | Status: DC | PRN

## 2016-08-12 MED ORDER — DULOXETINE HCL 60 MG PO CPEP
60.0000 mg | ORAL_CAPSULE | Freq: Every day | ORAL | Status: DC
  Administered 2016-08-12 – 2016-08-13 (×2): 60 mg via ORAL
  Filled 2016-08-12 (×2): qty 1

## 2016-08-12 MED ORDER — AMLODIPINE BESYLATE 10 MG PO TABS
10.0000 mg | ORAL_TABLET | Freq: Every day | ORAL | Status: DC
  Administered 2016-08-12 – 2016-08-13 (×2): 10 mg via ORAL
  Filled 2016-08-12 (×2): qty 1

## 2016-08-12 MED ORDER — HEPARIN SODIUM (PORCINE) 5000 UNIT/ML IJ SOLN
5000.0000 [IU] | Freq: Three times a day (TID) | INTRAMUSCULAR | Status: DC
  Administered 2016-08-12 – 2016-08-13 (×3): 5000 [IU] via SUBCUTANEOUS
  Filled 2016-08-12 (×3): qty 1

## 2016-08-12 MED ORDER — MAGNESIUM SULFATE 2 GM/50ML IV SOLN
2.0000 g | Freq: Once | INTRAVENOUS | Status: AC
  Administered 2016-08-12: 2 g via INTRAVENOUS
  Filled 2016-08-12: qty 50

## 2016-08-12 MED ORDER — ATORVASTATIN CALCIUM 20 MG PO TABS
20.0000 mg | ORAL_TABLET | Freq: Every day | ORAL | Status: DC
  Administered 2016-08-12 – 2016-08-13 (×2): 20 mg via ORAL
  Filled 2016-08-12 (×2): qty 1

## 2016-08-12 MED ORDER — TRAZODONE HCL 50 MG PO TABS
50.0000 mg | ORAL_TABLET | Freq: Every day | ORAL | Status: DC
  Administered 2016-08-12: 50 mg via ORAL
  Filled 2016-08-12: qty 1

## 2016-08-12 MED ORDER — MUPIROCIN 2 % EX OINT
1.0000 "application " | TOPICAL_OINTMENT | Freq: Two times a day (BID) | CUTANEOUS | Status: DC
  Administered 2016-08-12 – 2016-08-13 (×3): 1 via NASAL
  Filled 2016-08-12: qty 22

## 2016-08-12 MED ORDER — ARIPIPRAZOLE 5 MG PO TABS
15.0000 mg | ORAL_TABLET | Freq: Every day | ORAL | Status: DC
  Administered 2016-08-12 – 2016-08-13 (×2): 15 mg via ORAL
  Filled 2016-08-12: qty 3
  Filled 2016-08-12: qty 1

## 2016-08-12 MED ORDER — ONDANSETRON HCL 4 MG/2ML IJ SOLN: 4.0000 mg | Freq: Four times a day (QID) | INTRAMUSCULAR | Status: DC | PRN

## 2016-08-12 MED ORDER — TECHNETIUM TO 99M ALBUMIN AGGREGATED
4.0000 | Freq: Once | INTRAVENOUS | Status: AC | PRN
  Administered 2016-08-12: 4 via INTRAVENOUS

## 2016-08-12 MED ORDER — GABAPENTIN 300 MG PO CAPS
300.0000 mg | ORAL_CAPSULE | Freq: Two times a day (BID) | ORAL | Status: DC
  Administered 2016-08-12 – 2016-08-13 (×3): 300 mg via ORAL
  Filled 2016-08-12 (×3): qty 1

## 2016-08-12 MED ORDER — ACETAMINOPHEN 325 MG PO TABS
650.0000 mg | ORAL_TABLET | ORAL | Status: DC | PRN
  Administered 2016-08-12: 650 mg via ORAL
  Filled 2016-08-12: qty 2

## 2016-08-12 MED ORDER — INSULIN ASPART 100 UNIT/ML ~~LOC~~ SOLN
0.0000 [IU] | Freq: Three times a day (TID) | SUBCUTANEOUS | Status: DC
  Administered 2016-08-12: 2 [IU] via SUBCUTANEOUS
  Administered 2016-08-12: 1 [IU] via SUBCUTANEOUS
  Administered 2016-08-13 (×2): 2 [IU] via SUBCUTANEOUS

## 2016-08-12 MED ORDER — CHLORHEXIDINE GLUCONATE CLOTH 2 % EX PADS
6.0000 | MEDICATED_PAD | Freq: Every day | CUTANEOUS | Status: DC
  Administered 2016-08-12 – 2016-08-13 (×2): 6 via TOPICAL

## 2016-08-12 MED ORDER — DOCUSATE SODIUM 100 MG PO CAPS
100.0000 mg | ORAL_CAPSULE | Freq: Two times a day (BID) | ORAL | Status: DC
  Administered 2016-08-12 – 2016-08-13 (×4): 100 mg via ORAL
  Filled 2016-08-12 (×4): qty 1

## 2016-08-12 MED ORDER — MORPHINE SULFATE (PF) 2 MG/ML IV SOLN: 2.0000 mg | INTRAVENOUS | Status: DC | PRN

## 2016-08-12 MED ORDER — METHOCARBAMOL 500 MG PO TABS
500.0000 mg | ORAL_TABLET | Freq: Once | ORAL | Status: AC
  Administered 2016-08-12: 500 mg via ORAL
  Filled 2016-08-12: qty 1

## 2016-08-12 NOTE — Progress Notes (Signed)
Patient admitted after midnight, await LE duple and V/Q scan.  Will allow to eat.  Will order repeat BNP per cardiology recs.  Eulogio Bear DO

## 2016-08-12 NOTE — Progress Notes (Signed)
*  PRELIMINARY RESULTS* Vascular Ultrasound Bilateral lower extremity venous duplex has been completed.  Preliminary findings: No evidence of deep vein thrombosis or baker's cysts bilaterally.   Everrett Coombe 08/12/2016, 3:06 PM

## 2016-08-12 NOTE — Consult Note (Signed)
Reason for Consult:   Chest pain  Requesting Physician: [facility] Dr Domenic Polite  HPI:   65 y/o obese female with schizophrenia, HTN, CRI-3, and obesity hypoventilation syndrome, we are asked to see for chest pain and SOB. The patient lives in an assisted living facility. Records show she had a negative Myoview in 2007 and was seen by cardiology in 2011 for chest pain. She was suspected to have diastolic CHF then. She was recently seen by Dr Domenic Polite 06/30/16 for LE edema. An echo done 07/14/16 showed normal LVF with grade 1 DD.   The pt was admitted to [facility] 08/11/16 with chest pain and SOB. A D- dimer was done and was elevated -1.65. Her Troponin were negative. She does have a history of a PE in 2010 per her records. The CT scanner at [facility] was down and she was transferred to [facility] for further evaluation. The pt was able to give some history to me this am. She says she had pleuritic chest pain yesterday and felt SOB. This morning she denies any chest pain or SOB. She gives no history of exertional chest pain but she is not physically active at her living facility. VQ scan and LE venous dopplers have been ordered.   PMHx:  Past Medical History:  Diagnosis Date  . Anxiety   . Asthma   . Back pain   . Barrett esophagus   . CKD (chronic kidney disease) stage 3, GFR 30-59 ml/min   . Depression   . Diastolic heart failure ([place])   . Essential hypertension   . Fibromyalgia   . GERD (gastroesophageal reflux disease)   . Gout   . History of pericarditis   . Hyperlipidemia   . Nephrolithiasis    2008  . Obesity hypoventilation syndrome (HCC)    CPAP  . PE (pulmonary embolism)    2010  . Peripheral neuropathy ([place])   . Rheumatoid arthritis ([place])   . Schizophrenia ([place])   . Steroid dependence   . Type 2 diabetes mellitus ([place])   . Vitamin D deficiency     Past Surgical History:  Procedure Laterality Date  . APPENDECTOMY    . CHOLECYSTECTOMY     . COLONOSCOPY     benign polyps (12/2000), repeat colonoscopy performed in 2007  . ENDOMETRIAL BIOPSY     10/03 benign columnar mucosa (limited material)  . KIDNEY SURGERY      SOCHx:  reports that she quit smoking about 44 years ago. Her smoking use included Cigarettes. She has never used smokeless tobacco. She reports that she does not drink alcohol or use drugs.  FAMHx: Family History  Problem Relation Age of Onset  . Bone cancer Mother   . Hypertension Mother   . Heart disease Mother   . COPD Father   . Heart disease Father   . Stroke Father     ALLERGIES: Allergies  Allergen Reactions  . Benztropine Mesylate     REACTION: Alopecia and white spots on eyes  . Codeine     REACTION: Mouth Swelling - Tachycardia  . Diazepam     REACTION: COMA  . Diphenhydramine Hcl     REACTION: Tachycardia , anxiousness  . Lisinopril     REACTION: cough  . Penicillins     REACTION: ulcers in mouth, swelling, throat swelling  Has patient had a PCN reaction causing immediate rash, facial/tongue/throat swelling, SOB or lightheadedness with hypotension: YES Has patient had a  PCN reaction causing severe rash involving Mucus membranes or skin necrosis: Unknown Has patient had a PCN reaction that required hospitalization Unknown Has patient had a PCN reaction occurring within the last 10 years: unknown If all of the above answers are "NO", then may proceed with Cephalospori  . Sulfonamide Derivatives     REACTION: rash - blisters  . Thioridazine Hcl Other (See Comments)    REACTION: swelling    ROS: Review of Systems: General: negative for chills, fever, night sweats or weight changes.  Cardiovascular: negative for chest pain, dyspnea on exertion, edema, orthopnea, palpitations, paroxysmal nocturnal dyspnea or shortness of breath HEENT: negative for any visual disturbances, blindness, glaucoma Dermatological: negative for rash Respiratory: negative for cough, hemoptysis, or  wheezing Urologic: negative for hematuria or dysuria Abdominal: negative for nausea, vomiting, diarrhea, bright red blood per rectum, melena, or hematemesis Neurologic: negative for visual changes, syncope, or dizziness Musculoskeletal: negative for back pain, joint pain, or swelling Psych: cooperative and appropriate All other systems reviewed and are otherwise negative except as noted above.   HOME MEDICATIONS: Prior to Admission medications   Medication Sig Start Date End Date Taking? Authorizing Provider  albuterol (PROVENTIL HFA;VENTOLIN HFA) 108 (90 Base) MCG/ACT inhaler Inhale 1 puff into the lungs every 4 (four) hours as needed for wheezing or shortness of breath. 11/12/15   Collier Salina, MD  amLODipine (NORVASC) 10 MG tablet Take 10 mg by mouth daily.    Historical Provider, MD  ARIPiprazole (ABILIFY) 15 MG tablet Take 15 mg by mouth daily.    Historical Provider, MD  aspirin 81 MG tablet Take 81 mg by mouth daily.    Historical Provider, MD  atorvastatin (LIPITOR) 20 MG tablet Take 20 mg by mouth daily.    Historical Provider, MD  docusate sodium (COLACE) 100 MG capsule Take 100 mg by mouth 2 (two) times daily.    Historical Provider, MD  EPINEPHrine (EPI-PEN) 0.3 mg/0.3 mL DEVI Inject 0.3 mg into the muscle once.    Historical Provider, MD  furosemide (LASIX) 20 MG tablet Take 20 mg by mouth daily.    Historical Provider, MD  insulin glargine (LANTUS) 100 UNIT/ML injection Inject 20 Units into the skin at bedtime.    Historical Provider, MD  Insulin Syringes, Disposable, U-100 0.5 ML MISC Use as directed 12/21/11   Gregor Hams, MD  ipratropium (ATROVENT) 0.02 % nebulizer solution Take 0.5 mg by nebulization 4 (four) times daily.    Historical Provider, MD  lactulose (CHRONULAC) 10 GM/15ML solution Take 30 g by mouth daily as needed for mild constipation.    Historical Provider, MD  metFORMIN (GLUCOPHAGE) 500 MG tablet Take 1 tablet (500 mg total) by mouth 2 (two) times daily.  05/08/12   Jacquelyn A McGill, MD  Multiple Vitamin (MULTIVITAMIN WITH MINERALS) TABS tablet Take 1 tablet by mouth daily.    Historical Provider, MD  omeprazole (PRILOSEC) 20 MG capsule Take 20 mg by mouth 2 (two) times daily.     Historical Provider, MD  traMADol (ULTRAM) 50 MG tablet Take 50 mg by mouth every 6 (six) hours as needed for moderate pain.    Historical Provider, MD  traZODone (DESYREL) 100 MG tablet Take 50 mg by mouth at bedtime.     Historical Provider, MD    HOSPITAL MEDICATIONS: I have reviewed the patient's current medications.  VITALS: Blood pressure (!) 146/58, pulse 64, temperature 98.2 F (36.8 C), temperature source Oral, resp. rate 19, height 5' (1.524 m), weight  189 lb 12.8 oz (86.1 kg), SpO2 100 %.  PHYSICAL EXAM: General appearance: alert, cooperative, no distress and moderately obese Neck: no carotid bruit and no JVD Lungs: clear to auscultation bilaterally Heart: regular rate and rhythm Abdomen: obese, RUQ surgical scar Extremities: no edema, chronic skin changes Pulses: diminnished Skin: pale, cool, dry Neurologic: Grossly normal  LABS: Results for orders placed or performed during the hospital encounter of 08/11/16 (from the past 24 hour(s))  Troponin I     Status: None   Collection Time: 08/11/16 11:55 PM  Result Value Ref Range   Troponin I <0.03 <0.03 ng/mL  Basic metabolic panel     Status: Abnormal   Collection Time: 08/11/16 11:55 PM  Result Value Ref Range   Sodium 142 135 - 145 mmol/L   Potassium 3.9 3.5 - 5.1 mmol/L   Chloride 109 101 - 111 mmol/L   CO2 25 22 - 32 mmol/L   Glucose, Bld 101 (H) 65 - 99 mg/dL   BUN 32 (H) 6 - 20 mg/dL   Creatinine, Ser 1.62 (H) 0.44 - 1.00 mg/dL   Calcium 8.8 (L) 8.9 - 10.3 mg/dL   GFR calc non Af Amer 33 (L) >60 mL/min   GFR calc Af Amer 38 (L) >60 mL/min   Anion gap 8 5 - 15  CBC     Status: Abnormal   Collection Time: 08/11/16 11:55 PM  Result Value Ref Range   WBC 8.3 4.0 - 10.5 K/uL   RBC  2.97 (L) 3.87 - 5.11 MIL/uL   Hemoglobin 9.5 (L) 12.0 - 15.0 g/dL   HCT 27.2 (L) 36.0 - 46.0 %   MCV 91.6 78.0 - 100.0 fL   MCH 32.0 26.0 - 34.0 pg   MCHC 34.9 30.0 - 36.0 g/dL   RDW 13.2 11.5 - 15.5 %   Platelets 159 150 - 400 K/uL  Protime-INR     Status: None   Collection Time: 08/11/16 11:55 PM  Result Value Ref Range   Prothrombin Time 14.1 11.4 - 15.2 seconds   INR 1.09   APTT     Status: None   Collection Time: 08/11/16 11:55 PM  Result Value Ref Range   aPTT 33 24 - 36 seconds  MRSA PCR Screening     Status: Abnormal   Collection Time: 08/12/16 12:18 AM  Result Value Ref Range   MRSA by PCR POSITIVE (A) NEGATIVE  Troponin I-serum (0, 3, 6 hours)     Status: None   Collection Time: 08/12/16  1:33 AM  Result Value Ref Range   Troponin I <0.03 <0.03 ng/mL  Troponin I-serum (0, 3, 6 hours)     Status: None   Collection Time: 08/12/16  3:57 AM  Result Value Ref Range   Troponin I <0.03 <0.03 ng/mL    EKG: NSR, RBBB  IMAGING: CXR from [facility]- No acute changes  Echo 07/14/16- Study Conclusions  - Left ventricle: The cavity size was normal. Wall thickness was   increased in a pattern of mild LVH. Systolic function was normal.   The estimated ejection fraction was in the range of 60% to 65%.   Doppler parameters are consistent with abnormal left ventricular   relaxation (grade 1 diastolic dysfunction). - Aortic valve: Mildly calcified annulus. Trileaflet; mildly   thickened leaflets. Valve area (VTI): 1.79 cm^2. Valve area   (Vmax): 1.53 cm^2. Valve area (Vmean): 1.55 cm^2. - Left atrium: The atrium was severely dilated. - Technically difficult study.   IMPRESSION:  Principal Problem:   Chest pain Active Problems:   Insulin dependent diabetes mellitus with complications (HCC)   Anemia   Schizophrenia (HCC)   Essential hypertension   Chronic diastolic heart failure (HCC)   Venous (peripheral) insufficiency   CKD (chronic kidney disease) stage 3,  GFR 30-59 ml/min   History of pulmonary embolus (PE)   Obesity hypoventilation syndrome (HCC)   RBBB   Positive D dimer   RECOMMENDATION: Not sure she needs another echo- will cancel for now.   Agree with VQ, though negative Troponin suggest she has not had a large PE.   Her BNP was elevated at [facility] -700. No CHF on exam but its possible her symptoms are from early diastolic CHF, will re check BNP.    MD to see.   Time Spent Directly with Patient:  [address] minutes  Kerin Ransom, [place] beeper 08/12/2016, 7:38 AM   Personally seen and examined. Agree with above.  65 year old female with history of schizophrenia, chronic kidney disease here with chest pain episode when waking up this morning. Nonexertional. Prior stress test was negative in 2007. Echocardiogram previously showed normal ejection fraction in 07/14/2016. There was no evidence of DVT on Doppler exam. There was no evidence of PE on V/Q exam.  She is currently chest pain-free. Quite active in her living facility. No exertional component. Lungs are clear, obese, alert. No rubs.  I wonder if her chest discomfort was possibly GERD related or perhaps musculoskeletal. Troponin is reassuring. EKG shows right bundle branch block with no evidence of ischemia.  I think would be reasonable to discharge at this point.  Candee Furbish, MD

## 2016-08-12 NOTE — H&P (Signed)
History and Physical    Aleksis Jiggetts HYQ:[id number] DOB: [birth year] DOA: 08/11/2016  PCP: Kingsley Callander, FNP   Patient coming from: [facility] ED  Chief Complaint: Chest pain  HPI: [name] is a 65 y.o. woman with a history of HTN, HLD, schizophrenia, CKD 3, chronic diastolic CHF, DM, obesity hypoventilation syndrome, and prior PE in 2010 who presented to the [facility] ED for evaluation of chest pain.  Patient states that the pain awakened her out of her sleep around 6AM this morning.  It was associated with shortness of breath but no nausea, vomiting, or diaphoresis.  Pain lasted for about three hours before she decided to seek medical attention.  She did not take anything in an attempt to alleviate her symptoms prior to reporting to the ED.  ED Course: Records reviewed from the ED in [place].  Patient received full strength aspirin and nitro paste there.  Chest xray was negative for acute process.  EKG showed NSR, RBBB, LVH, no acute ST segment changes.  ABG was essentially normal.  Hgb 10.5.  Platelet count 104.  D-Dimer 1.65.  BNP 721.  BUN 40.  Creatinine 1.62.  Normal LFTs.  Negative troponin x 2.  Patient referred to [facility] to complete chest pain evaluation.  Review of Systems: As per HPI otherwise 10 point review of systems negative.    Past Medical History:  Diagnosis Date  . Anxiety   . Asthma   . Back pain   . Barrett esophagus   . CKD (chronic kidney disease) stage 3, GFR 30-59 ml/min   . Depression   . Diastolic heart failure ([place])   . Essential hypertension   . Fibromyalgia   . GERD (gastroesophageal reflux disease)   . Gout   . History of pericarditis   . Hyperlipidemia   . Nephrolithiasis    2008  . Obesity hypoventilation syndrome (HCC)    CPAP  . PE (pulmonary embolism)    2010  . Peripheral neuropathy ([place])   . Rheumatoid arthritis ([place])   . Schizophrenia ([place])   . Steroid dependence   . Type 2 diabetes mellitus ([place])   . Vitamin D  deficiency     Past Surgical History:  Procedure Laterality Date  . APPENDECTOMY    . CHOLECYSTECTOMY    . COLONOSCOPY     benign polyps (12/2000), repeat colonoscopy performed in 2007  . ENDOMETRIAL BIOPSY     10/03 benign columnar mucosa (limited material)  . KIDNEY SURGERY       reports that she quit smoking about 44 years ago. Her smoking use included Cigarettes. She has never used smokeless tobacco. She reports that she does not drink alcohol or use drugs.  She lives in a facility.  Ambulates with a walker at baseline.  She tells me that she does not have any family.  Allergies  Allergen Reactions  . Benztropine Mesylate     REACTION: Alopecia and white spots on eyes  . Codeine     REACTION: Mouth Swelling - Tachycardia  . Diazepam     REACTION: COMA  . Diphenhydramine Hcl     REACTION: Tachycardia , anxiousness  . Lisinopril     REACTION: cough  . Penicillins     REACTION: ulcers in mouth, swelling, throat swelling  Has patient had a PCN reaction causing immediate rash, facial/tongue/throat swelling, SOB or lightheadedness with hypotension: YES Has patient had a PCN reaction causing severe rash involving Mucus membranes or skin necrosis: Unknown Has patient  had a PCN reaction that required hospitalization Unknown Has patient had a PCN reaction occurring within the last 10 years: unknown If all of the above answers are "NO", then may proceed with Cephalospori  . Sulfonamide Derivatives     REACTION: rash - blisters  . Thioridazine Hcl Other (See Comments)    REACTION: swelling    Family History  Problem Relation Age of Onset  . Bone cancer Mother   . Hypertension Mother   . Heart disease Mother   . COPD Father   . Heart disease Father   . Stroke Father      Prior to Admission medications   Medication Sig Start Date End Date Taking? Authorizing Provider  albuterol (PROVENTIL HFA;VENTOLIN HFA) 108 (90 Base) MCG/ACT inhaler Inhale 1 puff into the lungs every 4  (four) hours as needed for wheezing or shortness of breath. 11/12/15   Collier Salina, MD  amLODipine (NORVASC) 10 MG tablet Take 10 mg by mouth daily.    Historical Provider, MD  ARIPiprazole (ABILIFY) 15 MG tablet Take 15 mg by mouth daily.    Historical Provider, MD  aspirin 81 MG tablet Take 81 mg by mouth daily.    Historical Provider, MD  atorvastatin (LIPITOR) 20 MG tablet Take 20 mg by mouth daily.    Historical Provider, MD  docusate sodium (COLACE) 100 MG capsule Take 100 mg by mouth 2 (two) times daily.    Historical Provider, MD  EPINEPHrine (EPI-PEN) 0.3 mg/0.3 mL DEVI Inject 0.3 mg into the muscle once.    Historical Provider, MD  furosemide (LASIX) 20 MG tablet Take 20 mg by mouth daily.    Historical Provider, MD  insulin glargine (LANTUS) 100 UNIT/ML injection Inject 20 Units into the skin at bedtime.    Historical Provider, MD  Insulin Syringes, Disposable, U-100 0.5 ML MISC Use as directed 12/21/11   Gregor Hams, MD  ipratropium (ATROVENT) 0.02 % nebulizer solution Take 0.5 mg by nebulization 4 (four) times daily.    Historical Provider, MD  lactulose (CHRONULAC) 10 GM/15ML solution Take 30 g by mouth daily as needed for mild constipation.    Historical Provider, MD  metFORMIN (GLUCOPHAGE) 500 MG tablet Take 1 tablet (500 mg total) by mouth 2 (two) times daily. 05/08/12   Jacquelyn A McGill, MD  Multiple Vitamin (MULTIVITAMIN WITH MINERALS) TABS tablet Take 1 tablet by mouth daily.    Historical Provider, MD  omeprazole (PRILOSEC) 20 MG capsule Take 20 mg by mouth 2 (two) times daily.     Historical Provider, MD  traMADol (ULTRAM) 50 MG tablet Take 50 mg by mouth every 6 (six) hours as needed for moderate pain.    Historical Provider, MD  traZODone (DESYREL) 100 MG tablet Take 50 mg by mouth at bedtime.     Historical Provider, MD    Physical Exam: Vitals:   08/11/16 2321  BP: (!) 150/68  Pulse: 62  Resp: 17  Temp: 97.5 F (36.4 C)  TempSrc: Oral  SpO2: 100%  Height:  5' (1.524 m)      Constitutional: NAD, calm, comfortable, nontoxic appearing Vitals:   08/11/16 2321  BP: (!) 150/68  Pulse: 62  Resp: 17  Temp: 97.5 F (36.4 C)  TempSrc: Oral  SpO2: 100%  Height: 5' (1.524 m)   Eyes: PERRL, lids and conjunctivae normal ENMT: Mucous membranes are moist. Posterior pharynx clear of any exudate or lesions. Normal dentition.  Neck: normal appearance, supple Respiratory: clear to auscultation bilaterally, no  wheezing, no crackles. Normal respiratory effort. No accessory muscle use.  Cardiovascular: Normal rate, regular rhythm.  She has a murmur.  No rubs / gallops. No significant pitting edema noted.  2+ pedal pulses. GI: abdomen is soft and compressible.  No distention.  No tenderness.  Bowel sounds are present. Musculoskeletal:  No joint deformity in upper and lower extremities. Good ROM, no contractures. Normal muscle tone.  Skin: no rashes, warm and dry Neurologic: No focal deficits. Psychiatric: Normal judgment and insight. Alert and oriented x 3. Normal mood.     Labs on Admission: I have personally reviewed following labs and imaging studies  CBC:  Recent Labs Lab 08/11/16 2355  WBC 8.3  HGB 9.5*  HCT 27.2*  MCV 91.6  PLT 786   Basic Metabolic Panel:  Recent Labs Lab 08/11/16 2355  NA 142  K 3.9  CL 109  CO2 25  GLUCOSE 101*  BUN 32*  CREATININE 1.62*  CALCIUM 8.8*   GFR: CrCl cannot be calculated (Unknown ideal weight.).  Coagulation Profile:  Recent Labs Lab 08/11/16 2355  INR 1.09   Cardiac Enzymes:  Recent Labs Lab 08/11/16 2355  TROPONINI <0.03    Assessment/Plan Principal Problem:   Chest pain Active Problems:   Obesity   Anemia   HYPERTENSION, BENIGN SYSTEMIC   DIASTOLIC HEART FAILURE, CHRONIC   CKD (chronic kidney disease) stage 3, GFR 30-59 ml/min   History of pulmonary embolus (PE)   Obesity hypoventilation syndrome (HCC)      Chest pain, heart score 5, multiple cardiac risk  factors but also has elevated D-Dimer --Telemetry monitoring --Serial troponin --Echo in the AM --CTA deferred due to CKD.  Lower extremity dopplers and V/Q scan in the AM --NPO after midnight --No empiric anticoagulation for now (chest pain free, hemodynamically stable) --Continue baby aspirin daily for now --Does not appear to be in decompensated CHF to me  Diabetes --HOLD metformin, lantus for now --SSI coverage  CKD 3 --Appears stable; avoid nephrotoxic agents  Obesity hypoventilation syndrome --CPAP per home regimen  HTN --Amlodipine, lasix  HLD --Statin  Schizoprenia --Abilify     DVT prophylaxis: SubQ heparin Code Status: FULL Family Communication: Patient alone at time of admission to [facility] Disposition Plan: To be determined. Consults called: NONE Admission status: Place in observation with telemetry monitoring.   TIME SPENT: 70 minutes   Eber Jones MD [facility] Pager [phone]  If 7PM-7AM, please contact night-coverage [email or web address] Password [email or web address]  08/12/2016, 1:29 AM

## 2016-08-12 NOTE — Care Management Note (Signed)
Case Management Note  Patient Details  Name: [name] MRN: [id number] Date of Birth: June 30, 1951  Subjective/Objective:   Pt presented for Chest Pain. Pt is from [facility]. Plan will be to return once stable.                  Action/Plan: CSW assisting with disposition needs. CM will continue to monitor.   Expected Discharge Date:                  Expected Discharge Plan:  Group Home  In-House Referral:  Clinical Social Work  Discharge planning Services  CM Consult  Post Acute Care Choice:    Choice offered to:     DME Arranged:    DME Agency:     HH Arranged:    HH Agency:     Status of Service:  In process, will continue to follow  If discussed at Long Length of Stay Meetings, dates discussed:    Additional Comments:  Bethena Roys, RN 08/12/2016, 4:14 PM

## 2016-08-13 DIAGNOSIS — N183 Chronic kidney disease, stage 3 (moderate): Secondary | ICD-10-CM | POA: Diagnosis not present

## 2016-08-13 DIAGNOSIS — R079 Chest pain, unspecified: Secondary | ICD-10-CM | POA: Diagnosis not present

## 2016-08-13 DIAGNOSIS — I5032 Chronic diastolic (congestive) heart failure: Secondary | ICD-10-CM

## 2016-08-13 DIAGNOSIS — R0789 Other chest pain: Secondary | ICD-10-CM

## 2016-08-13 DIAGNOSIS — I10 Essential (primary) hypertension: Secondary | ICD-10-CM | POA: Diagnosis not present

## 2016-08-13 LAB — GLUCOSE, CAPILLARY
Glucose-Capillary: 175 mg/dL — ABNORMAL HIGH (ref 65–99)
Glucose-Capillary: 188 mg/dL — ABNORMAL HIGH (ref 65–99)

## 2016-08-13 MED ORDER — AMLODIPINE BESYLATE 10 MG PO TABS: 10.0000 mg | ORAL_TABLET | Freq: Every day | ORAL | 0 refills | Status: DC

## 2016-08-13 MED ORDER — CARVEDILOL 25 MG PO TABS: 25.0000 mg | ORAL_TABLET | Freq: Two times a day (BID) | ORAL | Status: DC

## 2016-08-13 MED ORDER — FUROSEMIDE 20 MG PO TABS: 20.0000 mg | ORAL_TABLET | Freq: Every day | ORAL | 0 refills | Status: DC

## 2016-08-13 NOTE — Progress Notes (Addendum)
Patient Name: [name] Date of Encounter: 08/13/2016  Primary Cardiologist: Dr [facility] Problem List     Principal Problem:   Chest pain Active Problems:   Insulin dependent diabetes mellitus with complications (HCC)   Anemia   Schizophrenia (HCC)   Essential hypertension   Chronic diastolic heart failure (HCC)   Venous (peripheral) insufficiency   CKD (chronic kidney disease) stage 3, GFR 30-59 ml/min   History of pulmonary embolus (PE)   Obesity hypoventilation syndrome (HCC)   RBBB   Positive D dimer     Subjective   Chest pain has resolved. She has a dry cough, no dyspnea  Inpatient Medications    Scheduled Meds: . amLODipine  10 mg Oral Daily  . ARIPiprazole  15 mg Oral Daily  . aspirin EC  81 mg Oral Daily  . atorvastatin  20 mg Oral Daily  . Chlorhexidine Gluconate Cloth  6 each Topical Q0600  . docusate sodium  100 mg Oral BID  . DULoxetine  60 mg Oral Daily  . furosemide  20 mg Oral Daily  . gabapentin  300 mg Oral BID  . heparin  5,000 Units Subcutaneous Q8H  . insulin aspart  0-9 Units Subcutaneous TID WC  . mupirocin ointment  1 application Nasal BID  . pantoprazole  40 mg Oral Daily  . risperiDONE  1 mg Oral QHS  . traZODone  50 mg Oral QHS   Continuous Infusions:  PRN Meds: acetaminophen, ondansetron (ZOFRAN) IV, technetium TC 34M diethylenetriame-pentaacetic acid   Vital Signs    Vitals:   08/12/16 2117 08/12/16 2207 08/13/16 0527 08/13/16 0900  BP: (!) 154/78  135/79 (!) 135/100  Pulse: 74 74 83   Resp: 19 12 (!) 23   Temp: 98.2 F (36.8 C)  98.3 F (36.8 C)   TempSrc: Oral  Oral   SpO2: 98% 100% 96%   Weight:   191 lb 8 oz (86.9 kg)   Height:        Intake/Output Summary (Last 24 hours) at 08/13/16 1011 Last data filed at 08/13/16 0911  Gross per 24 hour  Intake              600 ml  Output             1250 ml  Net             -650 ml   Filed Weights   08/12/16 0451 08/13/16 0527  Weight: 189 lb 12.8 oz  (86.1 kg) 191 lb 8 oz (86.9 kg)    Physical Exam    GEN: Obese female in no acute distress.  HEENT: Grossly normal. Glasses Neck: Supple, no JVD Cardiac: RRR, no murmurs, rubs, or gallops. No clubbing, cyanosis, edema.  Radials/DP/PT 2+ and equal bilaterally.  Respiratory:  Respirations regular and unlabored, clear to auscultation bilaterally. MS: no deformity or atrophy. Skin: warm and dry, no rash. Neuro:  Strength and sensation are intact. Psych: AAOx3.  Normal affect.  Labs    CBC  Recent Labs  08/11/16 2355  WBC 8.3  HGB 9.5*  HCT 27.2*  MCV 91.6  PLT 132   Basic Metabolic Panel  Recent Labs  08/11/16 2355 08/12/16 1159  NA 142  --   K 3.9  --   CL 109  --   CO2 25  --   GLUCOSE 101*  --   BUN 32*  --   CREATININE 1.62*  --   CALCIUM 8.8*  --  MG  --  2.1   Liver Function Tests No results for input(s): AST, ALT, ALKPHOS, BILITOT, PROT, ALBUMIN in the last 72 hours. No results for input(s): LIPASE, AMYLASE in the last 72 hours. Cardiac Enzymes  Recent Labs  08/12/16 0133 08/12/16 0357 08/12/16 0714  TROPONINI <0.03 <0.03 <0.03    Telemetry    NSR, PVCs, couplets - Personally Reviewed  ECG    NSR, SB, RBBB- Personally Reviewed  Radiology    Dg Chest 2 View  Result Date: 08/12/2016 CLINICAL DATA:  Shortness of breath. EXAM: CHEST  2 VIEW COMPARISON:  Radiograph of Jan 07, 2016. FINDINGS: The heart size and mediastinal contours are within normal limits. Both lungs are clear. No pneumothorax or pleural effusion is noted. The visualized skeletal structures are unremarkable. IMPRESSION: No active cardiopulmonary disease. Electronically Signed   By: Marijo Conception, M.D.   On: 08/12/2016 13:29   Nm Pulmonary Perf And Vent  Result Date: 08/12/2016 CLINICAL DATA:  Leg pain.  Prior pulmonary embolus 2010 EXAM: NUCLEAR MEDICINE VENTILATION - PERFUSION LUNG SCAN TECHNIQUE: Ventilation images were obtained in multiple projections using inhaled  aerosol Tc-4m DTPA. Perfusion images were obtained in multiple projections after intravenous injection of Tc-50m MAA. RADIOPHARMACEUTICALS:  32.0 mCi Technetium-46m DTPA aerosol inhalation and 4.2 mCi Technetium-84m MAA IV COMPARISON:  Chest x-ray 08/12/2016 FINDINGS: Ventilation: No focal ventilation defect. Perfusion: No wedge shaped peripheral perfusion defects to suggest acute pulmonary embolism. IMPRESSION: No evidence of pulmonary embolus. Electronically Signed   By: Rolm Baptise M.D.   On: 08/12/2016 13:31    Cardiac Studies   Echo 07/14/16- Study Conclusions  - Left ventricle: The cavity size was normal. Wall thickness was   increased in a pattern of mild LVH. Systolic function was normal.   The estimated ejection fraction was in the range of 60% to 65%.   Doppler parameters are consistent with abnormal left ventricular   relaxation (grade 1 diastolic dysfunction). - Aortic valve: Mildly calcified annulus. Trileaflet; mildly   thickened leaflets. Valve area (VTI): 1.79 cm^2. Valve area   (Vmax): 1.53 cm^2. Valve area (Vmean): 1.55 cm^2. - Left atrium: The atrium was severely dilated. - Technically difficult study.  Patient Profile     65 y/o obese female with schizophrenia, HTN, CRI-3, and obesity hypoventilation syndrome, we were asked to see for chest pain and SOB. The patient lives in an assisted living facility. Records show she had a negative Myoview in 2007 and was seen by cardiology in 2011 for chest pain. She was suspected to have diastolic CHF then. She was recently seen by Dr Domenic Polite 06/30/16 for LE edema. An echo done 07/14/16 showed normal LVF with grade 1 DD.  Assessment & Plan    1. Chest pain- MI r/o, recent echo showed normal LVF. Her BNP was 500- ? If she has early diastolic CHF. She also has a dry cough-? Viral URI vs CHF.   2. Elevated D-dimer- VQ low risk  3. CRI-3- SCR 1.6  4. Uncontrolled HTN-   Plan: hesitant to give an extra dose of Lasix with  elevated SCr. Consider resuming Coreg at lower dose. She will need early f/u in [place].   Signed, Kerin Ransom, PA-C  08/13/2016, 10:11 AM   Personally seen and examined. Agree with above.  Blood pressure elevated this morning. Remember some medications were stopped because of her acute kidney injury. I agree with resuming carvedilol at low dose.  Appears comfortable in chair. She just walked with physical therapy  and did excellently.  VQ negative, DVT negative. Challenging to interpret the meaning of her BNP of 500. Comfortable with her being discharged when internal medicine team feels appropriate. We will sign off.  Candee Furbish, MD

## 2016-08-13 NOTE — Evaluation (Signed)
Physical Therapy Evaluation Patient Details Name: [name] MRN: [id number] DOB: 11/26/1950 Today's Date: 08/13/2016   History of Present Illness  65 y.o.woman admitted with chest pain. MI r/o, recent echo showed normal LVF. PMH: DM, schizophrenia, essential HTN, chronic diastolic heart failure, CKD, hx PE, RBBB.   Clinical Impression  Pt currently with functional limitations due to the deficits listed below. Pt motivated to return home where she resides at a group home. During PT session, pt able to ambulate 100 ft with rw. Anticipate pt will D/C back to the group home once medically released. PT to continue to follow acutely.       Follow Up Recommendations No PT follow up;Supervision - Intermittent    Equipment Recommendations  None recommended by PT    Recommendations for Other Services       Precautions / Restrictions Precautions Precautions: Fall Restrictions Weight Bearing Restrictions: No      Mobility  Bed Mobility Overal bed mobility: Needs Assistance Bed Mobility: Supine to Sit     Supine to sit: Modified independent (Device/Increase time)     General bed mobility comments: Pt using rail to assist.   Transfers Overall transfer level: Needs assistance Equipment used: Rolling walker (2 wheeled) Transfers: Sit to/from Stand Sit to Stand: Min guard         General transfer comment: guard for safety, good technique utilized  Ambulation/Gait Ambulation/Gait assistance: Physicist, medical (Feet): 100 Feet Assistive device: Rolling walker (2 wheeled) Gait Pattern/deviations: Step-through pattern Gait velocity: mild decrease   General Gait Details: mild flexed trunk, good stability using rw  Stairs            Wheelchair Mobility    Modified Rankin (Stroke Patients Only)       Balance Overall balance assessment: Needs assistance Sitting-balance support: No upper extremity supported Sitting balance-Leahy Scale: Good      Standing balance support: Bilateral upper extremity supported Standing balance-Leahy Scale: Poor Standing balance comment: using rw for support                             Pertinent Vitals/Pain Pain Assessment: No/denies pain    Home Living Family/patient expects to be discharged to:: Assisted living               Home Equipment: Walker - 2 wheels      Prior Function Level of Independence: Needs assistance   Gait / Transfers Assistance Needed: reports ambulating with rw  ADL's / Homemaking Assistance Needed: assistance needed with bathing/dressing        Hand Dominance        Extremity/Trunk Assessment   Upper Extremity Assessment Upper Extremity Assessment: Overall WFL for tasks assessed    Lower Extremity Assessment Lower Extremity Assessment: Overall WFL for tasks assessed       Communication   Communication: No difficulties  Cognition Arousal/Alertness: Awake/alert Behavior During Therapy: WFL for tasks assessed/performed Overall Cognitive Status: Within Functional Limits for tasks assessed                      General Comments General comments (skin integrity, edema, etc.): BP 163/82     Exercises     Assessment/Plan    PT Assessment Patient needs continued PT services  PT Problem List Decreased strength;Decreased activity tolerance;Decreased balance;Decreased mobility          PT Treatment Interventions DME instruction;Gait training;Functional mobility training;Therapeutic activities;Therapeutic exercise;Patient/family education  PT Goals (Current goals can be found in the Care Plan section)  Acute Rehab PT Goals Patient Stated Goal: Get back home PT Goal Formulation: With patient Time For Goal Achievement: 08/27/16 Potential to Achieve Goals: Good    Frequency Min 2X/week   Barriers to discharge        Co-evaluation               End of Session Equipment Utilized During Treatment: Gait  belt Activity Tolerance: Patient tolerated treatment well Patient left: in chair;with call bell/phone within reach Nurse Communication: Mobility status         Time: 2257-5051 PT Time Calculation (min) (ACUTE ONLY): 24 min   Charges:   PT Evaluation $PT Eval Moderate Complexity: 1 Procedure PT Treatments $Gait Training: 8-22 mins   PT G Codes:        Cassell Clement, PT, CSCS Pager [phone] Office [phone]  08/13/2016, 12:36 PM

## 2016-08-13 NOTE — NC FL2 (Addendum)
[place] MEDICAID FL2 LEVEL OF CARE SCREENING TOOL     IDENTIFICATION  Patient Name: [name] Birthdate: 09/25/1950 Sex: female Admission Date (Current Location): 08/11/2016  [facility] [place] Number:  Herbalist and Address:  [facility], [place] [address] [place], [place] [address]      Provider Number: [id number]  Attending Physician Name and Address:  Geradine Girt, DO  Relative Name and Phone Number:       Current Level of Care: Hospital Recommended Level of Care: Assisted Living Prior Approval Number:    Date Approved/Denied:   PASRR Number:    Discharge Plan: Other (Comment) (Group Home)    Current Diagnoses: Patient Active Problem List   Diagnosis Date Noted  . Chest pain 08/12/2016  . CKD (chronic kidney disease) stage 3, GFR 30-59 ml/min 08/12/2016  . History of pulmonary embolus (PE) 08/12/2016  . Obesity hypoventilation syndrome ([place]) 08/12/2016  . RBBB 08/12/2016  . Positive D dimer 08/12/2016  . CVA (cerebral infarction) 11/10/2015  . Physical deconditioning 07/01/2011  . Weakness 07/01/2011  . Diabetic foot ulcer ([place]) 05/19/2011  . Polypharmacy 02/09/2011  . BACK PAIN WITH RADICULOPATHY 05/25/2010  . Insulin dependent diabetes mellitus with complications ([place]) 65/99/3570  . Chronic diastolic heart failure ([place]) 04/25/2009  . Anemia 04/14/2009  . Sleep apnea 12/11/2008  . Diabetic peripheral neuropathy ([place]) 05/04/2007  . HYPERCHOLESTEROLEMIA 10/13/2006  . Gout, unspecified 10/13/2006  . HYPOKALEMIA 10/13/2006  . Schizophrenia ([place]) 10/13/2006  . Depression 10/13/2006  . Essential hypertension 10/13/2006  . Venous (peripheral) insufficiency 10/13/2006  . RHINITIS, ALLERGIC 10/13/2006  . Asthma 10/13/2006  . Reflux esophagitis 10/13/2006  . OSTEOARTHRITIS, MULTI SITES 10/13/2006  . INCONTINENCE, URGE 10/13/2006    Orientation RESPIRATION BLADDER Height & Weight     Self, Time, Situation, Place  Normal Continent Weight: 191 lb 8 oz (86.9 kg) Height:  5' (152.4 cm)  BEHAVIORAL SYMPTOMS/MOOD NEUROLOGICAL BOWEL NUTRITION STATUS      Continent Diet (Heart healthy; carb modified. Subject to change please see d/c summary.)  AMBULATORY STATUS COMMUNICATION OF NEEDS Skin   Independent Verbally Normal                       Personal Care Assistance Level of Assistance  Bathing, Feeding, Dressing Bathing Assistance: Independent Feeding assistance: Independent Dressing Assistance: Independent     Functional Limitations Info  Sight, Hearing, Speech Sight Info: Adequate Hearing Info: Adequate Speech Info: Adequate    SPECIAL CARE FACTORS FREQUENCY                       Contractures Contractures Info: Not present    Additional Factors Info  Code Status, Allergies, Isolation Precautions, Psychotropic Code Status Info: Full Allergies Info: Benztropine Mesylate, Codeine, Diazepam, Diphenhydramine Hcl, Penicillins, Sulfonamide Derivatives, Thioridazine Hcl, Lisinopril Psychotropic Info: RisperiDONE (RISPERDAL) tablet 1 mg, daily at bedtime. TraZODone (DESYREL) tablet 50 mg, daily at bedtime.   Isolation Precautions Info: Contact Precaution; MRSA     Current Medications (08/13/2016):  This is the current hospital active medication list Current Facility-Administered Medications  Medication Dose Route Frequency Provider Last Rate Last Dose  . acetaminophen (TYLENOL) tablet 650 mg  650 mg Oral Q4H PRN Lily Kocher, MD   650 mg at 08/12/16 1359  . amLODipine (NORVASC) tablet 10 mg  10 mg Oral Daily Lily Kocher, MD   10 mg at 08/13/16 0900  . ARIPiprazole (ABILIFY) tablet 15 mg  15 mg Oral Daily Lily Kocher, MD   15 mg at 08/13/16 0901  . aspirin EC tablet 81 mg  81 mg Oral Daily Lily Kocher, MD   81 mg at 08/13/16 0901  . atorvastatin (LIPITOR) tablet 20 mg  20 mg Oral Daily Lily Kocher, MD   20 mg at 08/13/16 0901  . carvedilol (COREG) tablet 25 mg  25 mg Oral BID WC  Geradine Girt, DO      . Chlorhexidine Gluconate Cloth 2 % PADS 6 each  6 each Topical Q0600 Geradine Girt, DO   6 each at 08/13/16 [phone]  . docusate sodium (COLACE) capsule 100 mg  100 mg Oral BID Lily Kocher, MD   100 mg at 08/13/16 0900  . DULoxetine (CYMBALTA) DR capsule 60 mg  60 mg Oral Daily Geradine Girt, DO   60 mg at 08/13/16 0900  . furosemide (LASIX) tablet 20 mg  20 mg Oral Daily Lily Kocher, MD   20 mg at 08/13/16 0901  . gabapentin (NEURONTIN) capsule 300 mg  300 mg Oral BID Geradine Girt, DO   300 mg at 08/13/16 0901  . heparin injection 5,000 Units  5,000 Units Subcutaneous Q8H Lily Kocher, MD   5,000 Units at 08/13/16 [phone]  . insulin aspart (novoLOG) injection 0-9 Units  0-9 Units Subcutaneous TID WC Lily Kocher, MD   2 Units at 08/13/16 [phone]  . mupirocin ointment (BACTROBAN) 2 % 1 application  1 application Nasal BID Geradine Girt, DO   1 application at 26/41/58 0901  . ondansetron (ZOFRAN) injection 4 mg  4 mg Intravenous Q6H PRN Lily Kocher, MD      . pantoprazole (PROTONIX) EC tablet 40 mg  40 mg Oral Daily Lily Kocher, MD   40 mg at 08/13/16 0900  . risperiDONE (RISPERDAL) tablet 1 mg  1 mg Oral QHS Ramell Wacha U Vann, DO   1 mg at 08/12/16 2200  . technetium TC 102M diethylenetriame-pentaacetic acid (DTPA) injection 32 millicurie  32 millicurie Intravenous Once PRN Gaspar Cola, MD      . traZODone (DESYREL) tablet 50 mg  50 mg Oral QHS Lily Kocher, MD   50 mg at 08/12/16 2200     Discharge Medications: Please see discharge summary for a list of discharge medications.  Relevant Imaging Results:  Relevant Lab Results:   Additional Information SSN: [id number]  Alla German, LCSW

## 2016-08-13 NOTE — Discharge Summary (Signed)
Physician Discharge Summary  [name] XBM:[id number] DOB: [birth year] DOA: 08/11/2016  PCP: Kingsley Callander, FNP  Admit date: 08/11/2016 Discharge date: 08/13/2016   Recommendations for Outpatient Follow-Up:   1. BMP 1 week 2. Cbc [redacted] week along with anemia work up 3. Close outpatient follow up with cardiology   Discharge Diagnosis:   Principal Problem:   Chest pain Active Problems:   Insulin dependent diabetes mellitus with complications (HCC)   Anemia   Schizophrenia (HCC)   Essential hypertension   Chronic diastolic heart failure (HCC)   Venous (peripheral) insufficiency   CKD (chronic kidney disease) stage 3, GFR 30-59 ml/min   History of pulmonary embolus (PE)   Obesity hypoventilation syndrome (HCC)   RBBB   Positive D dimer   Discharge disposition:  Group home/ILF  Discharge Condition: Improved.  Diet recommendation: Low sodium, heart healthy.  Wound care: None.   History of Present Illness:    [name] is a 65 y.o. woman with a history of HTN, HLD, schizophrenia, CKD 3, chronic diastolic CHF, DM, obesity hypoventilation syndrome, and prior PE in 2010 who presented to the [facility] ED for evaluation of chest pain.  Patient states that the pain awakened her out of her sleep around 6AM this morning.  It was associated with shortness of breath but no nausea, vomiting, or diaphoresis.  Pain lasted for about three hours before she decided to seek medical attention.  She did not take anything in an attempt to alleviate her symptoms prior to reporting to the ED.  ED Course: Records reviewed from the ED in [place].  Patient received full strength aspirin and nitro paste there.  Chest xray was negative for acute process.  EKG showed NSR, RBBB, LVH, no acute ST segment changes.  ABG was essentially normal.  Hgb 10.5.  Platelet count 104.  D-Dimer 1.65.  BNP 721.  BUN 40.  Creatinine 1.62.  Normal LFTs.  Negative troponin x 2.    Hospital Course by  Problem:   Chest pain, resolved --CTA deferred due to CKD.  Lower extremity dopplers and V/Q scan negative --No empiric anticoagulation for now (chest pain free, hemodynamically stable) --Continue baby aspirin daily for now --Does not appear to be in decompensated CHF to me but will need close cardiology follow up  Diabetes --resume home meds- trajenta (was not on insulin nor metformin)  CKD 3 --Appears stable; avoid nephrotoxic agents -BMP 1 week  Obesity hypoventilation syndrome --CPAP per home regimen  HTN --resume coreg and lasix check Cr in 1 week  HLD --Statin  Schizoprenia --Abilify was stopped as outpatient per our records -continue- Cymbalta and Risperdal  Anemia -outpatient work up with PCP   Medical Consultants:    cards   Discharge Exam:   Vitals:   08/13/16 0527 08/13/16 0900  BP: 135/79 (!) 135/100  Pulse: 83   Resp: (!) 23   Temp: 98.3 F (36.8 C)    Vitals:   08/12/16 2117 08/12/16 2207 08/13/16 0527 08/13/16 0900  BP: (!) 154/78  135/79 (!) 135/100  Pulse: 74 74 83   Resp: 19 12 (!) 23   Temp: 98.2 F (36.8 C)  98.3 F (36.8 C)   TempSrc: Oral  Oral   SpO2: 98% 100% 96%   Weight:   86.9 kg (191 lb 8 oz)   Height:        Gen:  NAD- anxious to get back home "I like my home and want to be there"  The results of significant diagnostics from this hospitalization (including imaging, microbiology, ancillary and laboratory) are listed below for reference.     Procedures and Diagnostic Studies:   Dg Chest 2 View  Result Date: 08/12/2016 CLINICAL DATA:  Shortness of breath. EXAM: CHEST  2 VIEW COMPARISON:  Radiograph of Jan 07, 2016. FINDINGS: The heart size and mediastinal contours are within normal limits. Both lungs are clear. No pneumothorax or pleural effusion is noted. The visualized skeletal structures are unremarkable. IMPRESSION: No active cardiopulmonary disease. Electronically Signed   By: Marijo Conception, M.D.   On:  08/12/2016 13:29   Nm Pulmonary Perf And Vent  Result Date: 08/12/2016 CLINICAL DATA:  Leg pain.  Prior pulmonary embolus 2010 EXAM: NUCLEAR MEDICINE VENTILATION - PERFUSION LUNG SCAN TECHNIQUE: Ventilation images were obtained in multiple projections using inhaled aerosol Tc-32m DTPA. Perfusion images were obtained in multiple projections after intravenous injection of Tc-41m MAA. RADIOPHARMACEUTICALS:  32.0 mCi Technetium-64m DTPA aerosol inhalation and 4.2 mCi Technetium-1m MAA IV COMPARISON:  Chest x-ray 08/12/2016 FINDINGS: Ventilation: No focal ventilation defect. Perfusion: No wedge shaped peripheral perfusion defects to suggest acute pulmonary embolism. IMPRESSION: No evidence of pulmonary embolus. Electronically Signed   By: Rolm Baptise M.D.   On: 08/12/2016 13:31     Labs:   Basic Metabolic Panel:  Recent Labs Lab 08/11/16 2355 08/12/16 1159  NA 142  --   K 3.9  --   CL 109  --   CO2 25  --   GLUCOSE 101*  --   BUN 32*  --   CREATININE 1.62*  --   CALCIUM 8.8*  --   MG  --  2.1   GFR Estimated Creatinine Clearance: 34.4 mL/min (by C-G formula based on SCr of 1.62 mg/dL (H)). Liver Function Tests: No results for input(s): AST, ALT, ALKPHOS, BILITOT, PROT, ALBUMIN in the last 168 hours. No results for input(s): LIPASE, AMYLASE in the last 168 hours. No results for input(s): AMMONIA in the last 168 hours. Coagulation profile  Recent Labs Lab 08/11/16 2355  INR 1.09    CBC:  Recent Labs Lab 08/11/16 2355  WBC 8.3  HGB 9.5*  HCT 27.2*  MCV 91.6  PLT 159   Cardiac Enzymes:  Recent Labs Lab 08/11/16 2355 08/12/16 0133 08/12/16 0357 08/12/16 0714  TROPONINI <0.03 <0.03 <0.03 <0.03   BNP: Invalid input(s): POCBNP CBG:  Recent Labs Lab 08/12/16 0800 08/12/16 1114 08/12/16 1643 08/12/16 2112 08/13/16 0743  GLUCAP 122* 139* 165* 114* 175*   D-Dimer No results for input(s): DDIMER in the last 72 hours. Hgb A1c No results for input(s):  HGBA1C in the last 72 hours. Lipid Profile No results for input(s): CHOL, HDL, LDLCALC, TRIG, CHOLHDL, LDLDIRECT in the last 72 hours. Thyroid function studies No results for input(s): TSH, T4TOTAL, T3FREE, THYROIDAB in the last 72 hours.  Invalid input(s): FREET3 Anemia work up No results for input(s): VITAMINB12, FOLATE, FERRITIN, TIBC, IRON, RETICCTPCT in the last 72 hours. Microbiology Recent Results (from the past 240 hour(s))  MRSA PCR Screening     Status: Abnormal   Collection Time: 08/12/16 12:18 AM  Result Value Ref Range Status   MRSA by PCR POSITIVE (A) NEGATIVE Final    Comment:        The GeneXpert MRSA Assay (FDA approved for NASAL specimens only), is one component of a comprehensive MRSA colonization surveillance program. It is not intended to diagnose MRSA infection nor to guide or monitor treatment for MRSA infections.  RESULT CALLED TO, READ BACK BY AND VERIFIED WITH: V MENDEZ,RN @0300  08/12/16 MKELLY,MLT      Discharge Instructions:   Discharge Instructions    Diet - low sodium heart healthy    Complete by:  As directed    Diet Carb Modified    Complete by:  As directed    Discharge instructions    Complete by:  As directed    BMP 1 week   Increase activity slowly    Complete by:  As directed      Allergies as of 08/13/2016      Reactions   Benztropine Mesylate Other (See Comments)   REACTION: Alopecia and white spots on eyes   Codeine Other (See Comments)   REACTION: Mouth Swelling - Tachycardia   Diazepam Other (See Comments)   REACTION: COMA   Diphenhydramine Hcl Other (See Comments)   REACTION: Tachycardia , anxiousness   Penicillins Other (See Comments)   REACTION: ulcers in mouth, swelling, throat swelling  Has patient had a PCN reaction causing immediate rash, facial/tongue/throat swelling, SOB or lightheadedness with hypotension: YES Has patient had a PCN reaction causing severe rash involving Mucus membranes or skin necrosis:  Unknown Has patient had a PCN reaction that required hospitalization Unknown Has patient had a PCN reaction occurring within the last 10 years: unknown If all of the above answers are "NO", then may proceed with Cephalospori   Sulfonamide Derivatives Other (See Comments)   REACTION: rash - blisters   Thioridazine Hcl Other (See Comments)   REACTION: swelling   Lisinopril Other (See Comments)   REACTION: cough      Medication List    STOP taking these medications   Insulin Syringes (Disposable) U-100 0.5 ML Misc   metFORMIN 500 MG tablet Commonly known as:  GLUCOPHAGE   spironolactone 25 MG tablet Commonly known as:  ALDACTONE     TAKE these medications   acetaminophen 325 MG tablet Commonly known as:  TYLENOL Take 650 mg by mouth every 8 (eight) hours as needed for moderate pain.   albuterol (5 MG/ML) 0.5% nebulizer solution Commonly known as:  PROVENTIL Take 2.5 mg by nebulization every 6 (six) hours as needed for wheezing or shortness of breath. What changed:  Another medication with the same name was removed. Continue taking this medication, and follow the directions you see here.   amLODipine 10 MG tablet Commonly known as:  NORVASC Take 1 tablet (10 mg total) by mouth daily. Start taking on:  08/14/2016   antiseptic oral rinse Liqd 15 mLs by Mouth Rinse route daily.   aspirin 81 MG tablet Take 81 mg by mouth daily.   atorvastatin 20 MG tablet Commonly known as:  LIPITOR Take 20 mg by mouth daily.   carvedilol 25 MG tablet Commonly known as:  COREG Take 25 mg by mouth 2 (two) times daily with a meal.   cholecalciferol 1000 units tablet Commonly known as:  VITAMIN D Take 4,000 Units by mouth daily.   DAILY VITE PO Take 1 tablet by mouth daily.   docusate sodium 100 MG capsule Commonly known as:  COLACE Take 100 mg by mouth daily.   DULoxetine 60 MG capsule Commonly known as:  CYMBALTA Take 60 mg by mouth daily.   EPINEPHrine 0.3 mg/0.3 mL  Devi Commonly known as:  EPI-PEN Inject 0.3 mg into the muscle once.   furosemide 20 MG tablet Commonly known as:  LASIX Take 1 tablet (20 mg total) by mouth daily. Start taking  on:  08/14/2016 What changed:  medication strength  how much to take   gabapentin 300 MG capsule Commonly known as:  NEURONTIN Take 300 mg by mouth 2 (two) times daily.   hydrOXYzine 25 MG capsule Commonly known as:  VISTARIL Take 25 mg by mouth 2 (two) times daily as needed for anxiety.   ipratropium 0.02 % nebulizer solution Commonly known as:  ATROVENT Take 0.5 mg by nebulization every 6 (six) hours as needed for wheezing or shortness of breath.   levothyroxine 25 MCG tablet Commonly known as:  SYNTHROID, LEVOTHROID Take 25 mcg by mouth daily before breakfast.   linagliptin 5 MG Tabs tablet Commonly known as:  TRADJENTA Take 5 mg by mouth daily.   LORazepam 1 MG tablet Commonly known as:  ATIVAN Take 1 mg by mouth daily as needed for anxiety.   NON FORMULARY 1 each by Other route See admin instructions. CPAP at bedtime. CPAP setting of 15CM of water   omeprazole 20 MG capsule Commonly known as:  PRILOSEC Take 20 mg by mouth daily.   prazosin 2 MG capsule Commonly known as:  MINIPRESS Take 2 mg by mouth at bedtime.   pyridOXINE 100 MG tablet Commonly known as:  VITAMIN B-6 Take 200 mg by mouth 2 (two) times daily.   risperiDONE 1 MG tablet Commonly known as:  RISPERDAL Take 1 mg by mouth at bedtime.   traZODone 100 MG tablet Commonly known as:  DESYREL Take 100 mg by mouth at bedtime as needed for sleep.         Time coordinating discharge: 35 min  Signed:  Natalie Mceuen U Tyrel Lex   [facility] 08/13/2016, 9:40 AM

## 2016-08-13 NOTE — Clinical Social Work Note (Signed)
Clinical Social Worker received notification that patient was from [facility] in [place], [place].  [place] contacted facility administrator [place] [facility]) who confirms patient is able to return and facility will provide transportation at discharge.  CSW to provide discharge documentation to transportation upon arrival.  Patient is agreeable with return at this time.    Clinical Social Worker will sign off for now as social work intervention is no longer needed. Please consult [place] again if new need arises.   Barbette Or, [place]

## 2016-08-13 NOTE — Care Management Note (Signed)
Case Management Note  Patient Details  Name: [name] MRN: [id number] Date of Birth: 11-08-1950  Subjective/Objective:   Pt presented for Chest Pain. Pt is from [facility]. Plan will be to return once stable.                  Action/Plan: CSW assisting with disposition needs. CM will continue to monitor.   Expected Discharge Date:                  Expected Discharge Plan:  Group Home  In-House Referral:  Clinical Social Work  Discharge planning Services  CM Consult  Post Acute Care Choice:    Choice offered to:     DME Arranged:    DME Agency:     HH Arranged:    [place] Agency:     Status of Service:  Completed, signed off  If discussed at [employer], dates discussed:    Additional Comments: 08/13/2016 Elenor Quinones, RN, BSN [phone] Pt will discharge to [facility] today - CSW following  Maryclare Labrador, RN 08/13/2016, 10:03 AM

## 2016-08-24 NOTE — Progress Notes (Signed)
Late entry for missing G-code.    18-Aug-2016 1240  PT G-Codes **NOT FOR INPATIENT CLASS**  Functional Assessment Tool Used clinical judgment  Functional Limitation Mobility: Walking and moving around  Mobility: Walking and Moving Around Current Status (T5320) CJ  Mobility: Walking and Moving Around Goal Status (E3343) CI  Cassell Clement, PT, CSCS Pager [phone] Office [phone]

## 2016-09-02 ENCOUNTER — Encounter: Payer: Medicaid Other | Admitting: Cardiology

## 2016-10-05 DIAGNOSIS — F29 Unspecified psychosis not due to a substance or known physiological condition: Secondary | ICD-10-CM | POA: Diagnosis not present

## 2016-10-06 ENCOUNTER — Encounter: Payer: Medicaid Other | Admitting: Cardiology

## 2016-10-06 DIAGNOSIS — J45909 Unspecified asthma, uncomplicated: Secondary | ICD-10-CM | POA: Diagnosis not present

## 2016-10-06 NOTE — Progress Notes (Deleted)
Cardiology Office Note  Date: 10/06/2016   ID: [name], DOB 07/16/1951, MRN [id number]  PCP: Kingsley Callander, FNP  Primary Cardiologist: Rozann Lesches, MD   No chief complaint on file.   History of Present Illness: [name] is a 66 y.o. female that I met back in November 2017. Records indicate hospitalization in December 2017 with chest discomfort. Symptoms resolved spontaneously and cardiac markers were negative arguing against ACS. She also had lower extremity venous Dopplers that were negative for DVT and a low probability VQ scan. She was seen by our service in consultation with indication that her symptoms were atypical from a cardiac perspective, possibility of reflux or musculoskeletal etiology considered.  Past Medical History:  Diagnosis Date  . Anxiety   . Asthma   . Back pain   . Barrett esophagus   . CKD (chronic kidney disease) stage 3, GFR 30-59 ml/min   . Depression   . Diastolic heart failure ([place])   . Essential hypertension   . Fibromyalgia   . GERD (gastroesophageal reflux disease)   . Gout   . History of pericarditis   . Hyperlipidemia   . Nephrolithiasis    2008  . Obesity hypoventilation syndrome (HCC)    CPAP  . PE (pulmonary embolism)    2010  . Peripheral neuropathy ([place])   . Rheumatoid arthritis ([place])   . Schizophrenia ([place])   . Steroid dependence   . Type 2 diabetes mellitus ([place])   . Vitamin D deficiency     Past Surgical History:  Procedure Laterality Date  . APPENDECTOMY    . CHOLECYSTECTOMY    . COLONOSCOPY     benign polyps (12/2000), repeat colonoscopy performed in 2007  . ENDOMETRIAL BIOPSY     10/03 benign columnar mucosa (limited material)  . KIDNEY SURGERY      Current Outpatient Prescriptions  Medication Sig Dispense Refill  . acetaminophen (TYLENOL) 325 MG tablet Take 650 mg by mouth every 8 (eight) hours as needed for moderate pain.    [name] Kitchen albuterol (PROVENTIL) (5 MG/ML) 0.5% nebulizer solution Take 2.5  mg by nebulization every 6 (six) hours as needed for wheezing or shortness of breath.    [name] Kitchen amLODipine (NORVASC) 10 MG tablet Take 1 tablet (10 mg total) by mouth daily. 30 tablet 0  . antiseptic oral rinse (BIOTENE) LIQD 15 mLs by Mouth Rinse route daily.    [name] Kitchen aspirin 81 MG tablet Take 81 mg by mouth daily.    [name] Kitchen atorvastatin (LIPITOR) 20 MG tablet Take 20 mg by mouth daily.    . carvedilol (COREG) 25 MG tablet Take 25 mg by mouth 2 (two) times daily with a meal.    . cholecalciferol (VITAMIN D) 1000 units tablet Take 4,000 Units by mouth daily.    [name] Kitchen docusate sodium (COLACE) 100 MG capsule Take 100 mg by mouth daily.     . DULoxetine (CYMBALTA) 60 MG capsule Take 60 mg by mouth daily.    [name] Kitchen EPINEPHrine (EPI-PEN) 0.3 mg/0.3 mL DEVI Inject 0.3 mg into the muscle once.    . furosemide (LASIX) 20 MG tablet Take 1 tablet (20 mg total) by mouth daily. 30 tablet 0  . gabapentin (NEURONTIN) 300 MG capsule Take 300 mg by mouth 2 (two) times daily.    . hydrOXYzine (VISTARIL) 25 MG capsule Take 25 mg by mouth 2 (two) times daily as needed for anxiety.    [name] Kitchen ipratropium (ATROVENT) 0.02 % nebulizer solution Take 0.5 mg by nebulization  every 6 (six) hours as needed for wheezing or shortness of breath.     . levothyroxine (SYNTHROID, LEVOTHROID) 25 MCG tablet Take 25 mcg by mouth daily before breakfast.    . linagliptin (TRADJENTA) 5 MG TABS tablet Take 5 mg by mouth daily.    [name] Kitchen LORazepam (ATIVAN) 1 MG tablet Take 1 mg by mouth daily as needed for anxiety.    . Multiple Vitamin (DAILY VITE PO) Take 1 tablet by mouth daily.    . NON FORMULARY 1 each by Other route See admin instructions. CPAP at bedtime. CPAP setting of 15CM of water    . omeprazole (PRILOSEC) 20 MG capsule Take 20 mg by mouth daily.     . prazosin (MINIPRESS) 2 MG capsule Take 2 mg by mouth at bedtime.    . pyridOXINE (VITAMIN B-6) 100 MG tablet Take 200 mg by mouth 2 (two) times daily.    . risperiDONE (RISPERDAL) 1 MG tablet Take 1 mg by  mouth at bedtime.    . traZODone (DESYREL) 100 MG tablet Take 100 mg by mouth at bedtime as needed for sleep.      No current facility-administered medications for this visit.    Allergies:  Benztropine mesylate; Codeine; Diazepam; Diphenhydramine hcl; Penicillins; Sulfonamide derivatives; Thioridazine hcl; and Lisinopril   Social History: The patient  reports that she quit smoking about 44 years ago. Her smoking use included Cigarettes. She has never used smokeless tobacco. She reports that she does not drink alcohol or use drugs.   Family History: The patient's family history includes Bone cancer in her mother; COPD in her father; Heart disease in her father and mother; Hypertension in her mother; Stroke in her father.   ROS:  Please see the history of present illness. Otherwise, complete review of systems is positive for {NONE DEFAULTED:18576::"none"}.  All other systems are reviewed and negative.   Physical Exam: VS:  There were no vitals taken for this visit., BMI There is no height or weight on file to calculate BMI.  Wt Readings from Last 3 Encounters:  08/13/16 191 lb 8 oz (86.9 kg)  06/30/16 194 lb 3.2 oz (88.1 kg)  11/11/15 201 lb 15.1 oz (91.6 kg)    General: Overweight woman, appears comfortable at rest. HEENT: Conjunctiva and lids normal, oropharynx clear. Neck: Supple, no elevated JVP or carotid bruits, no thyromegaly. Lungs: Diminished breath sounds, nonlabored breathing at rest. Cardiac: Regular rate and rhythm, no S3, 2/6 systolic murmur, no pericardial rub. Abdomen: Soft, nontender, bowel sounds present. Extremities: Chronic appearing firm lower leg edema and stasis, distal pulses 1-2+. Skin: Warm and dry. Musculoskeletal: No kyphosis. Neuropsychiatric: Alert and oriented x3, affect grossly appropriate.  ECG: I personally reviewed the tracing from 08/12/2016 which showed sinus rhythm with right bundle branch block, left anterior fascicular block, increased  voltage.  Recent Labwork: 11/10/2015: ALT 18; AST 23 08/11/2016: BUN 32; Creatinine, Ser 1.62; Hemoglobin 9.5; Platelets 159; Potassium 3.9; Sodium 142 08/12/2016: B Natriuretic Peptide 176.6; Magnesium 2.1     Component Value Date/Time   CHOL 160 11/11/2015 0239   TRIG 192 (H) 11/11/2015 0239   HDL 35 (L) 11/11/2015 0239   CHOLHDL 4.6 11/11/2015 0239   VLDL 38 11/11/2015 0239   LDLCALC 87 11/11/2015 0239   LDLDIRECT 96 02/28/2008 2044    Other Studies Reviewed Today:  Echocardiogram 07/14/2016: Study Conclusions  - Left ventricle: The cavity size was normal. Wall thickness was   increased in a pattern of mild LVH. Systolic function  was normal.   The estimated ejection fraction was in the range of 60% to 65%.   Doppler parameters are consistent with abnormal left ventricular   relaxation (grade 1 diastolic dysfunction). - Aortic valve: Mildly calcified annulus. Trileaflet; mildly   thickened leaflets. Valve area (VTI): 1.79 cm^2. Valve area   (Vmax): 1.53 cm^2. Valve area (Vmean): 1.55 cm^2. - Left atrium: The atrium was severely dilated. - Technically difficult study.  VQ scan 08/12/2016: FINDINGS: Ventilation: No focal ventilation defect.  Perfusion: No wedge shaped peripheral perfusion defects to suggest acute pulmonary embolism.  IMPRESSION: No evidence of pulmonary embolus.  Assessment and Plan:    Current medicines were reviewed with the patient today.  No orders of the defined types were placed in this encounter.   Disposition:  Signed, Satira Sark, MD, [facility] 10/06/2016 8:33 AM    [facility], [place], [place] [address] Phone: [phone]; Fax: [phone]

## 2016-10-11 ENCOUNTER — Observation Stay (HOSPITAL_COMMUNITY)
Admission: EM | Admit: 2016-10-11 | Discharge: 2016-10-13 | Disposition: A | Discharge status: Nursing Home (Includes ICF) | Payer: Medicare Other | Attending: Internal Medicine | Admitting: Internal Medicine

## 2016-10-11 ENCOUNTER — Encounter (HOSPITAL_COMMUNITY): Payer: Self-pay | Admitting: Emergency Medicine

## 2016-10-11 ENCOUNTER — Emergency Department (HOSPITAL_COMMUNITY): Payer: Medicare Other

## 2016-10-11 DIAGNOSIS — J45909 Unspecified asthma, uncomplicated: Secondary | ICD-10-CM | POA: Diagnosis not present

## 2016-10-11 DIAGNOSIS — E1122 Type 2 diabetes mellitus with diabetic chronic kidney disease: Secondary | ICD-10-CM | POA: Diagnosis not present

## 2016-10-11 DIAGNOSIS — I5033 Acute on chronic diastolic (congestive) heart failure: Secondary | ICD-10-CM | POA: Diagnosis present

## 2016-10-11 DIAGNOSIS — I700 Atherosclerosis of aorta: Secondary | ICD-10-CM | POA: Diagnosis not present

## 2016-10-11 DIAGNOSIS — G4730 Sleep apnea, unspecified: Secondary | ICD-10-CM | POA: Diagnosis not present

## 2016-10-11 DIAGNOSIS — M069 Rheumatoid arthritis, unspecified: Secondary | ICD-10-CM | POA: Insufficient documentation

## 2016-10-11 DIAGNOSIS — Z885 Allergy status to narcotic agent status: Secondary | ICD-10-CM | POA: Insufficient documentation

## 2016-10-11 DIAGNOSIS — F329 Major depressive disorder, single episode, unspecified: Secondary | ICD-10-CM | POA: Insufficient documentation

## 2016-10-11 DIAGNOSIS — N183 Chronic kidney disease, stage 3 (moderate): Secondary | ICD-10-CM

## 2016-10-11 DIAGNOSIS — E559 Vitamin D deficiency, unspecified: Secondary | ICD-10-CM | POA: Insufficient documentation

## 2016-10-11 DIAGNOSIS — F209 Schizophrenia, unspecified: Secondary | ICD-10-CM | POA: Diagnosis present

## 2016-10-11 DIAGNOSIS — Z86711 Personal history of pulmonary embolism: Secondary | ICD-10-CM | POA: Insufficient documentation

## 2016-10-11 DIAGNOSIS — G9341 Metabolic encephalopathy: Secondary | ICD-10-CM | POA: Insufficient documentation

## 2016-10-11 DIAGNOSIS — E1142 Type 2 diabetes mellitus with diabetic polyneuropathy: Secondary | ICD-10-CM | POA: Diagnosis present

## 2016-10-11 DIAGNOSIS — I130 Hypertensive heart and chronic kidney disease with heart failure and stage 1 through stage 4 chronic kidney disease, or unspecified chronic kidney disease: Secondary | ICD-10-CM | POA: Insufficient documentation

## 2016-10-11 DIAGNOSIS — Z6839 Body mass index (BMI) 39.0-39.9, adult: Secondary | ICD-10-CM | POA: Insufficient documentation

## 2016-10-11 DIAGNOSIS — N184 Chronic kidney disease, stage 4 (severe): Secondary | ICD-10-CM | POA: Diagnosis present

## 2016-10-11 DIAGNOSIS — E7800 Pure hypercholesterolemia, unspecified: Secondary | ICD-10-CM | POA: Insufficient documentation

## 2016-10-11 DIAGNOSIS — E662 Morbid (severe) obesity with alveolar hypoventilation: Secondary | ICD-10-CM | POA: Insufficient documentation

## 2016-10-11 DIAGNOSIS — Z22322 Carrier or suspected carrier of Methicillin resistant Staphylococcus aureus: Secondary | ICD-10-CM | POA: Insufficient documentation

## 2016-10-11 DIAGNOSIS — I672 Cerebral atherosclerosis: Secondary | ICD-10-CM | POA: Diagnosis not present

## 2016-10-11 DIAGNOSIS — M797 Fibromyalgia: Secondary | ICD-10-CM | POA: Diagnosis not present

## 2016-10-11 DIAGNOSIS — K760 Fatty (change of) liver, not elsewhere classified: Secondary | ICD-10-CM | POA: Diagnosis not present

## 2016-10-11 DIAGNOSIS — Z7984 Long term (current) use of oral hypoglycemic drugs: Secondary | ICD-10-CM | POA: Insufficient documentation

## 2016-10-11 DIAGNOSIS — I10 Essential (primary) hypertension: Secondary | ICD-10-CM

## 2016-10-11 DIAGNOSIS — E785 Hyperlipidemia, unspecified: Secondary | ICD-10-CM | POA: Insufficient documentation

## 2016-10-11 DIAGNOSIS — E1121 Type 2 diabetes mellitus with diabetic nephropathy: Secondary | ICD-10-CM | POA: Insufficient documentation

## 2016-10-11 DIAGNOSIS — F419 Anxiety disorder, unspecified: Secondary | ICD-10-CM | POA: Insufficient documentation

## 2016-10-11 DIAGNOSIS — R4182 Altered mental status, unspecified: Secondary | ICD-10-CM

## 2016-10-11 DIAGNOSIS — Z882 Allergy status to sulfonamides status: Secondary | ICD-10-CM | POA: Insufficient documentation

## 2016-10-11 DIAGNOSIS — Z87891 Personal history of nicotine dependence: Secondary | ICD-10-CM | POA: Insufficient documentation

## 2016-10-11 DIAGNOSIS — M109 Gout, unspecified: Secondary | ICD-10-CM | POA: Diagnosis not present

## 2016-10-11 DIAGNOSIS — D631 Anemia in chronic kidney disease: Secondary | ICD-10-CM | POA: Insufficient documentation

## 2016-10-11 DIAGNOSIS — R079 Chest pain, unspecified: Secondary | ICD-10-CM | POA: Diagnosis present

## 2016-10-11 DIAGNOSIS — I5032 Chronic diastolic (congestive) heart failure: Secondary | ICD-10-CM | POA: Diagnosis not present

## 2016-10-11 DIAGNOSIS — K449 Diaphragmatic hernia without obstruction or gangrene: Secondary | ICD-10-CM | POA: Insufficient documentation

## 2016-10-11 DIAGNOSIS — Z888 Allergy status to other drugs, medicaments and biological substances status: Secondary | ICD-10-CM | POA: Insufficient documentation

## 2016-10-11 DIAGNOSIS — Z7982 Long term (current) use of aspirin: Secondary | ICD-10-CM | POA: Insufficient documentation

## 2016-10-11 DIAGNOSIS — R42 Dizziness and giddiness: Secondary | ICD-10-CM | POA: Diagnosis not present

## 2016-10-11 DIAGNOSIS — G9340 Encephalopathy, unspecified: Secondary | ICD-10-CM | POA: Diagnosis not present

## 2016-10-11 DIAGNOSIS — M1991 Primary osteoarthritis, unspecified site: Secondary | ICD-10-CM | POA: Insufficient documentation

## 2016-10-11 DIAGNOSIS — E1165 Type 2 diabetes mellitus with hyperglycemia: Secondary | ICD-10-CM | POA: Diagnosis not present

## 2016-10-11 DIAGNOSIS — Z880 Allergy status to penicillin: Secondary | ICD-10-CM | POA: Insufficient documentation

## 2016-10-11 LAB — DIFFERENTIAL
Basophils Absolute: 0.1 10*3/uL (ref 0.0–0.1)
Basophils Relative: 1 %
Eosinophils Absolute: 0.3 10*3/uL (ref 0.0–0.7)
Eosinophils Relative: 3 %
Lymphocytes Relative: 27 %
Lymphs Abs: 2.4 10*3/uL (ref 0.7–4.0)
Monocytes Absolute: 0.4 10*3/uL (ref 0.1–1.0)
Monocytes Relative: 5 %
Neutro Abs: 5.8 10*3/uL (ref 1.7–7.7)
Neutrophils Relative %: 64 %

## 2016-10-11 LAB — COMPREHENSIVE METABOLIC PANEL
ALT: 22 U/L (ref 14–54)
AST: 19 U/L (ref 15–41)
Albumin: 3.3 g/dL — ABNORMAL LOW (ref 3.5–5.0)
Alkaline Phosphatase: 74 U/L (ref 38–126)
Anion gap: 8 (ref 5–15)
BUN: 32 mg/dL — ABNORMAL HIGH (ref 6–20)
CO2: 21 mmol/L — ABNORMAL LOW (ref 22–32)
Calcium: 9 mg/dL (ref 8.9–10.3)
Chloride: 107 mmol/L (ref 101–111)
Creatinine, Ser: 1.5 mg/dL — ABNORMAL HIGH (ref 0.44–1.00)
GFR calc Af Amer: 41 mL/min — ABNORMAL LOW (ref >60)
GFR calc non Af Amer: 35 mL/min — ABNORMAL LOW (ref >60)
Glucose, Bld: 199 mg/dL — ABNORMAL HIGH (ref 65–99)
Potassium: 4.4 mmol/L (ref 3.5–5.1)
Sodium: 136 mmol/L (ref 135–145)
Total Bilirubin: 0.4 mg/dL (ref 0.3–1.2)
Total Protein: 6.6 g/dL (ref 6.5–8.1)

## 2016-10-11 LAB — URINALYSIS, ROUTINE W REFLEX MICROSCOPIC
Bacteria, UA: NONE SEEN
Bilirubin Urine: NEGATIVE
Glucose, UA: 50 mg/dL — AB
Hgb urine dipstick: NEGATIVE
Ketones, ur: NEGATIVE mg/dL
Leukocytes, UA: NEGATIVE
Nitrite: NEGATIVE
Protein, ur: 100 mg/dL — AB
Specific Gravity, Urine: 1.01 (ref 1.005–1.030)
Squamous Epithelial / LPF: NONE SEEN
pH: 6 (ref 5.0–8.0)

## 2016-10-11 LAB — I-STAT CHEM 8, ED
BUN: 33 mg/dL — ABNORMAL HIGH (ref 6–20)
Calcium, Ion: 1.11 mmol/L — ABNORMAL LOW (ref 1.15–1.40)
Chloride: 107 mmol/L (ref 101–111)
Creatinine, Ser: 1.5 mg/dL — ABNORMAL HIGH (ref 0.44–1.00)
Glucose, Bld: 192 mg/dL — ABNORMAL HIGH (ref 65–99)
HCT: 29 % — ABNORMAL LOW (ref 36.0–46.0)
Hemoglobin: 9.9 g/dL — ABNORMAL LOW (ref 12.0–15.0)
Potassium: 4.4 mmol/L (ref 3.5–5.1)
Sodium: 139 mmol/L (ref 135–145)
TCO2: 21 mmol/L (ref 0–100)

## 2016-10-11 LAB — PROTIME-INR
INR: 1.02
Prothrombin Time: 13.4 seconds (ref 11.4–15.2)

## 2016-10-11 LAB — CBC
HCT: 30.8 % — ABNORMAL LOW (ref 36.0–46.0)
Hemoglobin: 10.7 g/dL — ABNORMAL LOW (ref 12.0–15.0)
MCH: 31.8 pg (ref 26.0–34.0)
MCHC: 34.7 g/dL (ref 30.0–36.0)
MCV: 91.7 fL (ref 78.0–100.0)
Platelets: 184 K/uL (ref 150–400)
RBC: 3.36 MIL/uL — ABNORMAL LOW (ref 3.87–5.11)
RDW: 13.4 % (ref 11.5–15.5)
WBC: 9.1 K/uL (ref 4.0–10.5)

## 2016-10-11 LAB — I-STAT TROPONIN, ED: Troponin i, poc: 0 ng/mL (ref 0.00–0.08)

## 2016-10-11 LAB — CBG MONITORING, ED: Glucose-Capillary: 176 mg/dL — ABNORMAL HIGH (ref 65–99)

## 2016-10-11 LAB — APTT: aPTT: 31 seconds (ref 24–36)

## 2016-10-11 MED ORDER — CLEVIDIPINE BUTYRATE 0.5 MG/ML IV EMUL
0.0000 mg/h | INTRAVENOUS | Status: DC
  Filled 2016-10-11: qty 50

## 2016-10-11 NOTE — ED Triage Notes (Signed)
Pt arrives with sudden onset CP, dizziness and ALOC at 1830. Pt from assisted living facility [facility] in [place]. Usually alert and oriented. Pt received 1 SL nitro.

## 2016-10-11 NOTE — ED Notes (Signed)
Notified ray MD of elevated BP, states hold off on BP meds for now

## 2016-10-11 NOTE — ED Notes (Signed)
Cancelled code stroke @ 2023

## 2016-10-11 NOTE — ED Provider Notes (Addendum)
[facility] DEPT Provider Note   CSN: [id number] Arrival date & time: 10/11/16  2016     History   Chief Complaint Chief Complaint  Patient presents with  . Altered Mental Status  . Chest Pain    HPI [name] is a 66 y.o. female. Level V caveat secondary to patient's inability to answer questions HPI This 66 year old woman with a history of depression, schizophrenia, type 2 diabetes, who presents today from assisted living facility with reports that she became dizzy and had an altered mental status. Stroke was activated prehospital. EMS reports that her blood sugar was over 200 initially. They also reports that she complained of some chest pain, during ride. On arrival here patient was seen by neurology who felt that patient's presentation was not consistent with stroke and that she should be evaluated for chest pain. She went to CT and return to room. She is not answering questions for me. Past Medical History:  Diagnosis Date  . Anxiety   . Asthma   . Back pain   . Barrett esophagus   . CKD (chronic kidney disease) stage 3, GFR 30-59 ml/min   . Depression   . Diastolic heart failure ([place])   . Essential hypertension   . Fibromyalgia   . GERD (gastroesophageal reflux disease)   . Gout   . History of pericarditis   . Hyperlipidemia   . Nephrolithiasis    2008  . Obesity hypoventilation syndrome (HCC)    CPAP  . PE (pulmonary embolism)    2010  . Peripheral neuropathy ([place])   . Rheumatoid arthritis ([place])   . Schizophrenia ([place])   . Steroid dependence   . Type 2 diabetes mellitus ([place])   . Vitamin D deficiency     Patient Active Problem List   Diagnosis Date Noted  . Chest pain 08/12/2016  . CKD (chronic kidney disease) stage 3, GFR 30-59 ml/min 08/12/2016  . History of pulmonary embolus (PE) 08/12/2016  . Obesity hypoventilation syndrome ([place]) 08/12/2016  . RBBB 08/12/2016  . Positive D dimer 08/12/2016  . CVA (cerebral infarction) 11/10/2015  .  Physical deconditioning 07/01/2011  . Weakness 07/01/2011  . Diabetic foot ulcer ([place]) 05/19/2011  . Polypharmacy 02/09/2011  . BACK PAIN WITH RADICULOPATHY 05/25/2010  . Insulin dependent diabetes mellitus with complications ([place]) 66/59/9357  . Chronic diastolic heart failure ([place]) 04/25/2009  . Anemia 04/14/2009  . Sleep apnea 12/11/2008  . Diabetic peripheral neuropathy ([place]) 05/04/2007  . HYPERCHOLESTEROLEMIA 10/13/2006  . Gout, unspecified 10/13/2006  . HYPOKALEMIA 10/13/2006  . Schizophrenia ([place]) 10/13/2006  . Depression 10/13/2006  . Essential hypertension 10/13/2006  . Venous (peripheral) insufficiency 10/13/2006  . RHINITIS, ALLERGIC 10/13/2006  . Asthma 10/13/2006  . Reflux esophagitis 10/13/2006  . OSTEOARTHRITIS, MULTI SITES 10/13/2006  . INCONTINENCE, URGE 10/13/2006    Past Surgical History:  Procedure Laterality Date  . APPENDECTOMY    . CHOLECYSTECTOMY    . COLONOSCOPY     benign polyps (12/2000), repeat colonoscopy performed in 2007  . ENDOMETRIAL BIOPSY     10/03 benign columnar mucosa (limited material)  . KIDNEY SURGERY      OB History    No data available       Home Medications    Prior to Admission medications   Medication Sig Start Date End Date Taking? Authorizing Provider  acetaminophen (TYLENOL) 325 MG tablet Take 650 mg by mouth every 8 (eight) hours as needed for moderate pain.    Historical Provider, MD  albuterol (PROVENTIL) (  5 MG/ML) 0.5% nebulizer solution Take 2.5 mg by nebulization every 6 (six) hours as needed for wheezing or shortness of breath.    Historical Provider, MD  amLODipine (NORVASC) 10 MG tablet Take 1 tablet (10 mg total) by mouth daily. 08/14/16   Geradine Girt, DO  antiseptic oral rinse (BIOTENE) LIQD 15 mLs by Mouth Rinse route daily.    Historical Provider, MD  aspirin 81 MG tablet Take 81 mg by mouth daily.    Historical Provider, MD  atorvastatin (LIPITOR) 20 MG tablet Take 20 mg by mouth daily.    Historical  Provider, MD  carvedilol (COREG) 25 MG tablet Take 25 mg by mouth 2 (two) times daily with a meal.    Historical Provider, MD  cholecalciferol (VITAMIN D) 1000 units tablet Take 4,000 Units by mouth daily.    Historical Provider, MD  docusate sodium (COLACE) 100 MG capsule Take 100 mg by mouth daily.     Historical Provider, MD  DULoxetine (CYMBALTA) 60 MG capsule Take 60 mg by mouth daily.    Historical Provider, MD  EPINEPHrine (EPI-PEN) 0.3 mg/0.3 mL DEVI Inject 0.3 mg into the muscle once.    Historical Provider, MD  furosemide (LASIX) 20 MG tablet Take 1 tablet (20 mg total) by mouth daily. 08/14/16   Geradine Girt, DO  gabapentin (NEURONTIN) 300 MG capsule Take 300 mg by mouth 2 (two) times daily.    Historical Provider, MD  hydrOXYzine (VISTARIL) 25 MG capsule Take 25 mg by mouth 2 (two) times daily as needed for anxiety.    Historical Provider, MD  ipratropium (ATROVENT) 0.02 % nebulizer solution Take 0.5 mg by nebulization every 6 (six) hours as needed for wheezing or shortness of breath.     Historical Provider, MD  levothyroxine (SYNTHROID, LEVOTHROID) 25 MCG tablet Take 25 mcg by mouth daily before breakfast.    Historical Provider, MD  linagliptin (TRADJENTA) 5 MG TABS tablet Take 5 mg by mouth daily.    Historical Provider, MD  LORazepam (ATIVAN) 1 MG tablet Take 1 mg by mouth daily as needed for anxiety.    Historical Provider, MD  Multiple Vitamin (DAILY VITE PO) Take 1 tablet by mouth daily.    Historical Provider, MD  NON FORMULARY 1 each by Other route See admin instructions. CPAP at bedtime. CPAP setting of 15CM of water    Historical Provider, MD  omeprazole (PRILOSEC) 20 MG capsule Take 20 mg by mouth daily.     Historical Provider, MD  prazosin (MINIPRESS) 2 MG capsule Take 2 mg by mouth at bedtime.    Historical Provider, MD  pyridOXINE (VITAMIN B-6) 100 MG tablet Take 200 mg by mouth 2 (two) times daily.    Historical Provider, MD  risperiDONE (RISPERDAL) 1 MG tablet Take  1 mg by mouth at bedtime.    Historical Provider, MD  traZODone (DESYREL) 100 MG tablet Take 100 mg by mouth at bedtime as needed for sleep.     Historical Provider, MD    Family History Family History  Problem Relation Age of Onset  . Bone cancer Mother   . Hypertension Mother   . Heart disease Mother   . COPD Father   . Heart disease Father   . Stroke Father     Social History Social History  Substance Use Topics  . Smoking status: Former Smoker    Types: Cigarettes    Quit date: 06/23/1972  . Smokeless tobacco: Never Used  . Alcohol use No  Allergies   Benztropine mesylate; Codeine; Diazepam; Diphenhydramine hcl; Penicillins; Sulfonamide derivatives; Thioridazine hcl; and Lisinopril   Review of Systems Review of Systems  Unable to perform ROS: Mental status change     Physical Exam Updated Vital Signs BP 199/77   Pulse 70   Temp 97.7 F (36.5 C) (Rectal)   Resp 18   SpO2 100%   Physical Exam  Constitutional: She appears well-developed and well-nourished. No distress.  HENT:  Head: Normocephalic and atraumatic.  Right Ear: External ear normal.  Left Ear: External ear normal.  Mouth/Throat: Oropharynx is clear and moist.  Chewing motions with mild  Eyes: EOM are normal. Pupils are equal, round, and reactive to light.  Neck: Normal range of motion. Neck supple.  Cardiovascular: Normal rate and regular rhythm.   Pulmonary/Chest: Effort normal and breath sounds normal.  Abdominal: Soft. Bowel sounds are normal.  Musculoskeletal: Normal range of motion.  Neurological: She displays normal reflexes. No cranial nerve deficit. She exhibits abnormal muscle tone. Coordination normal.  Eyes open and legs to confrontation All 4 extremities appear flaccid   Skin: Skin is warm and dry. Capillary refill takes less than 2 seconds.  Nursing note and vitals reviewed.    ED Treatments / Results  Labs (all labs ordered are listed, but only abnormal results are  displayed) Labs Reviewed  CBC - Abnormal; Notable for the following:       Result Value   RBC 3.36 (*)    Hemoglobin 10.7 (*)    HCT 30.8 (*)    All other components within normal limits  COMPREHENSIVE METABOLIC PANEL - Abnormal; Notable for the following:    CO2 21 (*)    Glucose, Bld 199 (*)    BUN 32 (*)    Creatinine, Ser 1.50 (*)    Albumin 3.3 (*)    GFR calc non Af Amer 35 (*)    GFR calc Af Amer 41 (*)    All other components within normal limits  CBG MONITORING, ED - Abnormal; Notable for the following:    Glucose-Capillary 176 (*)    All other components within normal limits  I-STAT CHEM 8, ED - Abnormal; Notable for the following:    BUN 33 (*)    Creatinine, Ser 1.50 (*)    Glucose, Bld 192 (*)    Calcium, Ion 1.11 (*)    Hemoglobin 9.9 (*)    HCT 29.0 (*)    All other components within normal limits  PROTIME-INR  APTT  DIFFERENTIAL  I-STAT TROPOININ, ED    EKG  EKG Interpretation  Date/Time:  Monday October 11 2016 20:30:36 EST Ventricular Rate:  67 PR Interval:    QRS Duration: 168 QT Interval:  446 QTC Calculation: 471 R Axis:   -46 Text Interpretation:  Normal sinus rhythm Left anterior fasicular block Right bundle branch block No significant change since last tracing Confirmed by Kimaya Whitlatch MD, Andee Poles [phone]) on 10/11/2016 9:55:00 PM       Radiology Ct Head Wo Contrast  Result Date: 10/11/2016 CLINICAL DATA:  Sudden onset chest pain, dizziness, altered level consciousness EXAM: CT HEAD WITHOUT CONTRAST TECHNIQUE: Contiguous axial images were obtained from the base of the skull through the vertex without intravenous contrast. COMPARISON:  MR brain 11/11/2015, CT brain 11/10/2015 FINDINGS: Brain: No evidence of acute infarction, hemorrhage, extra-axial collection, ventriculomegaly, or mass effect. Generalized cerebral atrophy. Periventricular white matter low attenuation likely secondary to microangiopathy. Vascular: Cerebrovascular atherosclerotic  calcifications are noted. Skull: Negative for fracture  or focal lesion. Sinuses/Orbits: Visualized portions of the orbits are unremarkable. Visualized portions of the paranasal sinuses and mastoid air cells are unremarkable. Other: None. IMPRESSION: No acute intracranial pathology. Electronically Signed   By: Kathreen Devoid   On: 10/11/2016 21:25    Procedures Procedures (including critical care time)  Medications Ordered in ED Medications - No data to display   Initial Impression / Assessment and Plan / ED Course  I have reviewed the triage vital signs and the nursing notes.  Pertinent labs & imaging results that were available during my care of the patient were reviewed by me and considered in my medical decision making (see chart for details).    Patient with acute onset altered mental status without lateralized deficits and some complaints of chest pain In the setting of known schizophrenia. Although she does complain one time about chest pain, otherwise she has not been communicative. EKG does not show any acutely ischemic changes and troponin is normal. Her she does have significant blood pressure elevation. CT shows no evidence of acute ischemic changes or bleeding. Plan: Proximal for blood pressure control in setting of altered mental status with hypertension likely hypertensive emergency.  2- anemia- stable 3-cri-sable with creatinine of 1.5 4- hyperglycemia   Discussed with Dr. Loleta Books and he will see for admission. RN states bp fluctuating with occasional elevated bp but more consistent bp 150-160.  Plan hold cleviprex for now Final Clinical Impressions(s) / ED Diagnoses   Final diagnoses:  Altered mental status, unspecified altered mental status type    New Prescriptions New Prescriptions   No medications on file     Pattricia Boss, MD 10/11/16 2329    Pattricia Boss, MD 10/11/16 2332

## 2016-10-11 NOTE — ED Notes (Signed)
[place] MD at bedside. 

## 2016-10-11 NOTE — Consult Note (Signed)
NEURO HOSPITALIST CONSULT NOTE   Requestig physician: Dr. Jeanell Sparrow  Reason for Consult: Decreased verbal responses to questions while en route to ED for chest pain evaluation.   History obtained from:  Chart and EMS  HPI:                                                                                                                                          [name] is an 66 y.o. female who presents with decreased verbal responses to questions while en route to ED for chest pain evaluation. EMS states she was able to answer several questions while in the ambulance, but for a short period had decreased verbal output. Also had been complaining of dizziness.   Past Medical History:  Diagnosis Date  . Anxiety   . Asthma   . Back pain   . Barrett esophagus   . CKD (chronic kidney disease) stage 3, GFR 30-59 ml/min   . Depression   . Diastolic heart failure ([place])   . Essential hypertension   . Fibromyalgia   . GERD (gastroesophageal reflux disease)   . Gout   . History of pericarditis   . Hyperlipidemia   . Nephrolithiasis    2008  . Obesity hypoventilation syndrome (HCC)    CPAP  . PE (pulmonary embolism)    2010  . Peripheral neuropathy ([place])   . Rheumatoid arthritis ([place])   . Schizophrenia ([place])   . Steroid dependence   . Type 2 diabetes mellitus ([place])   . Vitamin D deficiency     Past Surgical History:  Procedure Laterality Date  . APPENDECTOMY    . CHOLECYSTECTOMY    . COLONOSCOPY     benign polyps (12/2000), repeat colonoscopy performed in 2007  . ENDOMETRIAL BIOPSY     10/03 benign columnar mucosa (limited material)  . KIDNEY SURGERY      Family History  Problem Relation Age of Onset  . Bone cancer Mother   . Hypertension Mother   . Heart disease Mother   . COPD Father   . Heart disease Father   . Stroke Father    Social History:  reports that she quit smoking about 44 years ago. Her smoking use included Cigarettes. She has never used  smokeless tobacco. She reports that she does not drink alcohol or use drugs.  Allergies  Allergen Reactions  . Benztropine Mesylate Other (See Comments)    REACTION: Alopecia and white spots on eyes  . Codeine Other (See Comments)    REACTION: Mouth Swelling - Tachycardia  . Diazepam Other (See Comments)    REACTION: COMA  . Diphenhydramine Hcl Other (See Comments)    REACTION: Tachycardia , anxiousness  . Penicillins Other (See Comments)    REACTION: ulcers in mouth, swelling,  throat swelling  Has patient had a PCN reaction causing immediate rash, facial/tongue/throat swelling, SOB or lightheadedness with hypotension: YES Has patient had a PCN reaction causing severe rash involving Mucus membranes or skin necrosis: Unknown Has patient had a PCN reaction that required hospitalization Unknown Has patient had a PCN reaction occurring within the last 10 years: unknown If all of the above answers are "NO", then may proceed with Cephalospori  . Sulfonamide Derivatives Other (See Comments)    REACTION: rash - blisters  . Thioridazine Hcl Other (See Comments)    REACTION: swelling  . Lisinopril Other (See Comments)    REACTION: cough    MEDICATIONS:                                                                                                                     acetaminophen (TYLENOL) 325 MG tablet Take 650 mg by mouth every 8 (eight) hours as needed for moderate pain. Historical Provider, MD Needs Review  albuterol (PROVENTIL) (5 MG/ML) 0.5% nebulizer solution Take 2.5 mg by nebulization every 6 (six) hours as needed for wheezing or shortness of breath. Historical Provider, MD Needs Review  amLODipine (NORVASC) 10 MG tablet Take 1 tablet (10 mg total) by mouth daily. Geradine Girt, DO Needs Review  antiseptic oral rinse (BIOTENE) LIQD 15 mLs by Mouth Rinse route daily. Historical Provider, MD Needs Review  aspirin 81 MG tablet Take 81 mg by mouth daily. Historical Provider, MD Needs  Review  atorvastatin (LIPITOR) 20 MG tablet Take 20 mg by mouth daily. Historical Provider, MD Needs Review  carvedilol (COREG) 25 MG tablet Take 25 mg by mouth 2 (two) times daily with a meal. Historical Provider, MD Needs Review  cholecalciferol (VITAMIN D) 1000 units tablet Take 4,000 Units by mouth daily. Historical Provider, MD Needs Review  docusate sodium (COLACE) 100 MG capsule Take 100 mg by mouth daily.  Historical Provider, MD Needs Review  DULoxetine (CYMBALTA) 60 MG capsule Take 60 mg by mouth daily. Historical Provider, MD Needs Review  EPINEPHrine (EPI-PEN) 0.3 mg/0.3 mL DEVI Inject 0.3 mg into the muscle once. Historical Provider, MD Needs Review  furosemide (LASIX) 20 MG tablet Take 1 tablet (20 mg total) by mouth daily. Geradine Girt, DO Needs Review  gabapentin (NEURONTIN) 300 MG capsule Take 300 mg by mouth 2 (two) times daily. Historical Provider, MD Needs Review  hydrOXYzine (VISTARIL) 25 MG capsule Take 25 mg by mouth 2 (two) times daily as needed for anxiety. Historical Provider, MD Needs Review  ipratropium (ATROVENT) 0.02 % nebulizer solution Take 0.5 mg by nebulization every 6 (six) hours as needed for wheezing or shortness of breath.  Historical Provider, MD Needs Review  levothyroxine (SYNTHROID, LEVOTHROID) 25 MCG tablet Take 25 mcg by mouth daily before breakfast. Historical Provider, MD Needs Review  linagliptin (TRADJENTA) 5 MG TABS tablet Take 5 mg by mouth daily. Historical Provider, MD Needs Review  LORazepam (ATIVAN) 1 MG tablet Take 1 mg by mouth daily as needed for  anxiety. Historical Provider, MD Needs Review  Multiple Vitamin (DAILY VITE PO) Take 1 tablet by mouth daily. Historical Provider, MD Needs Review  NON FORMULARY 1 each by Other route See admin instructions. CPAP at bedtime. CPAP setting of 15CM of water Historical Provider, MD Needs Review  omeprazole (PRILOSEC) 20 MG capsule Take 20 mg by mouth daily.  Historical Provider, MD Needs Review   prazosin (MINIPRESS) 2 MG capsule Take 2 mg by mouth at bedtime. Historical Provider, MD Needs Review  pyridOXINE (VITAMIN B-6) 100 MG tablet Take 200 mg by mouth 2 (two) times daily. Historical Provider, MD Needs Review  risperiDONE (RISPERDAL) 1 MG tablet Take 1 mg by mouth at bedtime. Historical Provider, MD Needs Review  traZODone (DESYREL) 100 MG tablet Take 100 mg by mouth at bedtime as needed for sleep.  Historical Provider, MD Needs Review    ROS:                                                                                                                                       History obtained from patient. She states "chest pain" when asked about her symptoms. She is otherwise nonverbal.   There were no vitals taken for this visit.   General Examination:                                                                                                      General: She appears fatigued but in NAD. No cyanosis or diaphoresis.  HEENT-  Normocephalic/atraumatic. Neck is supple.  Lungs- Respirations appear somewhat labored.  Extremities- No cyanosis of distal extremities.   Neurological Examination Mental Status: Decreased motor responses to sternal rub and plantar stimulation in the context of an awake state with eyes open, intact saccades to visual stimuli and brisk blink to threat.Decreased responses to commands and questions. States "chest pain" when asked for her main symptoms. Poor eye contact. Non-agitated.  Cranial Nerves: II: Blinks to threat presented in right and left visual fields. No pupillary asymmetry noted.  III,IV, VI: Ptosis not present. Saccades to visual stimuli are intact with conjugate EOM.  Does not follow commands for assessment of smooth pursuits. No nystagmus noted.  V,VII: Face symmetric in upper and lower quadrants. Prominent orolingual dyskinesia is present during the entirety of the exam. Reacts to facial light touch stimulation.  VIII: Responds to one  question.  IX,X: Speech hypophonic.  XI: No asymmetry noted.  XII: Dyskinesias as described above.  Motor: Arms gradually descend  back to bed symmetrically when raised. Does not follow motor commands. No cogwheel rigidity or spasticity noted.  Sensory: Turns head towards light pinch, without withdrawal. Plantars: Mute bilaterally.  Cerebellar: Does not follow commands for testing.  Gait: Deferred. Other: No limb twitching, jerking/myoclonus, eyelid twitching/fluttering, tonic posturing, or eye/head deviation to suggest seizure.   Lab Results: Basic Metabolic Panel:  Recent Labs Lab 10/11/16 2026  NA 139  K 4.4  CL 107  GLUCOSE 192*  BUN 33*  CREATININE 1.50*    Liver Function Tests: No results for input(s): AST, ALT, ALKPHOS, BILITOT, PROT, ALBUMIN in the last 168 hours. No results for input(s): LIPASE, AMYLASE in the last 168 hours. No results for input(s): AMMONIA in the last 168 hours.  CBC:  Recent Labs Lab 10/11/16 2026  HGB 9.9*  HCT 29.0*    Cardiac Enzymes: No results for input(s): CKTOTAL, CKMB, CKMBINDEX, TROPONINI in the last 168 hours.  Lipid Panel: No results for input(s): CHOL, TRIG, HDL, CHOLHDL, VLDL, LDLCALC in the last 168 hours.  CBG:  Recent Labs Lab 10/11/16 2020  GLUCAP 176*    Microbiology: Results for orders placed or performed during the hospital encounter of 08/11/16  MRSA PCR Screening     Status: Abnormal   Collection Time: 08/12/16 12:18 AM  Result Value Ref Range Status   MRSA by PCR POSITIVE (A) NEGATIVE Final    Comment:        The GeneXpert MRSA Assay (FDA approved for NASAL specimens only), is one component of a comprehensive MRSA colonization surveillance program. It is not intended to diagnose MRSA infection nor to guide or monitor treatment for MRSA infections. RESULT CALLED TO, READ BACK BY AND VERIFIED WITH: V MENDEZ,RN @0300  08/12/16 MKELLY,MLT     Coagulation Studies: No results for input(s): LABPROT,  INR in the last 72 hours.  Imaging: No results found.  Assessment: 66 year old female with acute onset of chest pain and dizziness. Decreased verbal responses to questions while en route.  1. Overall clinical picture most consistent with altered mental status (diffuse cerebral dysfunction). Also on DDx is psychiatric/functional etiology for her decreased speech output, in the context of her history of schizophrenia. No focal/lateralizing findings on exam.  2. Chief complaint of chest pain. 3. Orolingual diskinesia. DDx includes tardive dyskinesia secondary to neuroleptic use (current medications include risperdal; she has a history of schizophrenia).  4. Elevated BUN/Cr ratio, suggestive of volume depletion.   Recommendations: 1. ED work up for toxic/metabolic/infectious or medication related etiologies for AMS.  2. ED chest pain work up.  3. CT head.  Electronically signed: Dr. Kerney Elbe 10/11/2016, 8:30 PM

## 2016-10-11 NOTE — Progress Notes (Deleted)
Cardiology Office Note  Date: 10/11/2016   ID: [name], DOB 1951/05/26, MRN [id number]  PCP: Kingsley Callander, FNP  Primary Cardiologist: Rozann Lesches, MD   No chief complaint on file.   History of Present Illness: [name] is a 66 y.o. female seen in consultation back in November 2017. I reviewed interval records, she was transferred from [facility] to [facility] back in December 2017 with chest discomfort. Cardiac enzymes were negative for ACS. Lower extremity venous Dopplers and ventilation perfusion lung scan were negative for PE. She was seen by Dr. Marlou Porch in cardiology consultation, chest discomfort felt to be atypical, possibly related to GERD or musculoskeletal etiology.  Past Medical History:  Diagnosis Date  . Anxiety   . Asthma   . Back pain   . Barrett esophagus   . CKD (chronic kidney disease) stage 3, GFR 30-59 ml/min   . Depression   . Diastolic heart failure ([place])   . Essential hypertension   . Fibromyalgia   . GERD (gastroesophageal reflux disease)   . Gout   . History of pericarditis   . Hyperlipidemia   . Nephrolithiasis    2008  . Obesity hypoventilation syndrome (H[facility])    CPAP  . PE (pulmonary embolism)    2010  . Peripheral neuropathy ([place])   . Rheumatoid arthritis ([place])   . Schizophrenia ([place])   . Steroid dependence   . Type 2 diabetes mellitus ([place])   . Vitamin D deficiency     Past Surgical History:  Procedure Laterality Date  . APPENDECTOMY    . CHOLECYSTECTOMY    . COLONOSCOPY     benign polyps (12/2000), repeat colonoscopy performed in 2007  . ENDOMETRIAL BIOPSY     10/03 benign columnar mucosa (limited material)  . KIDNEY SURGERY      Current Outpatient Prescriptions  Medication Sig Dispense Refill  . acetaminophen (TYLENOL) 325 MG tablet Take 650 mg by mouth every 8 (eight) hours as needed for moderate pain.    [name] Kitchen albuterol (PROVENTIL) (5 MG/ML) 0.5% nebulizer solution Take 2.5 mg by nebulization every 6 (six) hours  as needed for wheezing or shortness of breath.    [name] Kitchen amLODipine (NORVASC) 10 MG tablet Take 1 tablet (10 mg total) by mouth daily. 30 tablet 0  . antiseptic oral rinse (BIOTENE) LIQD 15 mLs by Mouth Rinse route daily.    [name] Kitchen aspirin 81 MG tablet Take 81 mg by mouth daily.    [name] Kitchen atorvastatin (LIPITOR) 20 MG tablet Take 20 mg by mouth daily.    . carvedilol (COREG) 25 MG tablet Take 25 mg by mouth 2 (two) times daily with a meal.    . cholecalciferol (VITAMIN D) 1000 units tablet Take 4,000 Units by mouth daily.    [name] Kitchen docusate sodium (COLACE) 100 MG capsule Take 100 mg by mouth daily.     . DULoxetine (CYMBALTA) 60 MG capsule Take 60 mg by mouth daily.    [name] Kitchen EPINEPHrine (EPI-PEN) 0.3 mg/0.3 mL DEVI Inject 0.3 mg into the muscle once.    . furosemide (LASIX) 20 MG tablet Take 1 tablet (20 mg total) by mouth daily. 30 tablet 0  . gabapentin (NEURONTIN) 300 MG capsule Take 300 mg by mouth 2 (two) times daily.    . hydrOXYzine (VISTARIL) 25 MG capsule Take 25 mg by mouth 2 (two) times daily as needed for anxiety.    [name] Kitchen ipratropium (ATROVENT) 0.02 % nebulizer solution Take 0.5 mg by nebulization every 6 (six) hours  as needed for wheezing or shortness of breath.     . levothyroxine (SYNTHROID, LEVOTHROID) 25 MCG tablet Take 25 mcg by mouth daily before breakfast.    . linagliptin (TRADJENTA) 5 MG TABS tablet Take 5 mg by mouth daily.    [name] Kitchen LORazepam (ATIVAN) 1 MG tablet Take 1 mg by mouth daily as needed for anxiety.    . Multiple Vitamin (DAILY VITE PO) Take 1 tablet by mouth daily.    . NON FORMULARY 1 each by Other route See admin instructions. CPAP at bedtime. CPAP setting of 15CM of water    . omeprazole (PRILOSEC) 20 MG capsule Take 20 mg by mouth daily.     . prazosin (MINIPRESS) 2 MG capsule Take 2 mg by mouth at bedtime.    . pyridOXINE (VITAMIN B-6) 100 MG tablet Take 200 mg by mouth 2 (two) times daily.    . risperiDONE (RISPERDAL) 1 MG tablet Take 1 mg by mouth at bedtime.    . traZODone (DESYREL)  100 MG tablet Take 100 mg by mouth at bedtime as needed for sleep.      No current facility-administered medications for this visit.    Allergies:  Benztropine mesylate; Codeine; Diazepam; Diphenhydramine hcl; Penicillins; Sulfonamide derivatives; Thioridazine hcl; and Lisinopril   Social History: The patient  reports that she quit smoking about 44 years ago. Her smoking use included Cigarettes. She has never used smokeless tobacco. She reports that she does not drink alcohol or use drugs.   Family History: The patient's family history includes Bone cancer in her mother; COPD in her father; Heart disease in her father and mother; Hypertension in her mother; Stroke in her father.   ROS:  Please see the history of present illness. Otherwise, complete review of systems is positive for {NONE DEFAULTED:18576::"none"}.  All other systems are reviewed and negative.   Physical Exam: VS:  There were no vitals taken for this visit., BMI There is no height or weight on file to calculate BMI.  Wt Readings from Last 3 Encounters:  08/13/16 191 lb 8 oz (86.9 kg)  06/30/16 194 lb 3.2 oz (88.1 kg)  11/11/15 201 lb 15.1 oz (91.6 kg)    General: Overweight woman, appears comfortable at rest. HEENT: Conjunctiva and lids normal, oropharynx clear. Neck: Supple, no elevated JVP or carotid bruits, no thyromegaly. Lungs: Diminished breath sounds, nonlabored breathing at rest. Cardiac: Regular rate and rhythm, no S3, 2/6 systolic murmur, no pericardial rub. Abdomen: Soft, nontender, bowel sounds present. Extremities: Chronic appearing firm lower leg edema and stasis, distal pulses 1-2+. Skin: Warm and dry. Musculoskeletal: No kyphosis. Neuropsychiatric: Alert and oriented x3, affect grossly appropriate.  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  Recent Labwork: 11/10/2015: ALT 18; AST 23 08/11/2016:  BUN 32; Creatinine, Ser 1.62; Hemoglobin 9.5; Platelets 159; Potassium 3.9; Sodium 142 08/12/2016: B Natriuretic Peptide 176.6; Magnesium 2.1     Component Value Date/Time   CHOL 160 11/11/2015 0239   TRIG 192 (H) 11/11/2015 0239   HDL 35 (L) 11/11/2015 0239   CHOLHDL 4.6 11/11/2015 0239   VLDL 38 11/11/2015 0239   LDLCALC 87 11/11/2015 0239   LDLDIRECT 96 02/28/2008 2044    Other Studies Reviewed Today:  Echocardiogram 07/14/2016: Study Conclusions  - Left ventricle: The cavity size was normal. Wall thickness was   increased in a pattern of mild LVH. Systolic function was normal.   The estimated  ejection fraction was in the range of 60% to 65%.   Doppler parameters are consistent with abnormal left ventricular   relaxation (grade 1 diastolic dysfunction). - Aortic valve: Mildly calcified annulus. Trileaflet; mildly   thickened leaflets. Valve area (VTI): 1.79 cm^2. Valve area   (Vmax): 1.53 cm^2. Valve area (Vmean): 1.55 cm^2. - Left atrium: The atrium was severely dilated. - Technically difficult study.  Assessment and Plan:   Current medicines were reviewed with the patient today.  No orders of the defined types were placed in this encounter.   Disposition:  Signed, Satira Sark, MD, [facility] 10/11/2016 4:00 PM    [place] [facility] [address], [place], [place] [address] Phone: [phone]; Fax: [phone]

## 2016-10-12 ENCOUNTER — Observation Stay (HOSPITAL_COMMUNITY): Payer: Medicare Other

## 2016-10-12 ENCOUNTER — Encounter (HOSPITAL_COMMUNITY): Payer: Self-pay | Admitting: Family Medicine

## 2016-10-12 ENCOUNTER — Encounter: Payer: Medicaid Other | Admitting: Cardiology

## 2016-10-12 DIAGNOSIS — R4182 Altered mental status, unspecified: Secondary | ICD-10-CM

## 2016-10-12 DIAGNOSIS — R7989 Other specified abnormal findings of blood chemistry: Secondary | ICD-10-CM | POA: Diagnosis not present

## 2016-10-12 DIAGNOSIS — R079 Chest pain, unspecified: Secondary | ICD-10-CM | POA: Diagnosis not present

## 2016-10-12 LAB — CBC
HCT: 29.4 % — ABNORMAL LOW (ref 36.0–46.0)
Hemoglobin: 10 g/dL — ABNORMAL LOW (ref 12.0–15.0)
MCH: 31.2 pg (ref 26.0–34.0)
MCHC: 34 g/dL (ref 30.0–36.0)
MCV: 91.6 fL (ref 78.0–100.0)
Platelets: 177 10*3/uL (ref 150–400)
RBC: 3.21 MIL/uL — ABNORMAL LOW (ref 3.87–5.11)
RDW: 13.6 % (ref 11.5–15.5)
WBC: 7.6 10*3/uL (ref 4.0–10.5)

## 2016-10-12 LAB — BLOOD GAS, ARTERIAL
Acid-base deficit: 0.4 mmol/L (ref 0.0–2.0)
Bicarbonate: 21.3 mmol/L (ref 20.0–28.0)
Drawn by: 345601
FIO2: 21
O2 Saturation: 98.7 %
Patient temperature: 98.6
pCO2 arterial: 21.2 mmHg — ABNORMAL LOW (ref 32.0–48.0)
pH, Arterial: 7.607 (ref 7.350–7.450)
pO2, Arterial: 99.6 mmHg (ref 83.0–108.0)

## 2016-10-12 LAB — BASIC METABOLIC PANEL
Anion gap: 9 (ref 5–15)
BUN: 29 mg/dL — ABNORMAL HIGH (ref 6–20)
CO2: 21 mmol/L — ABNORMAL LOW (ref 22–32)
Calcium: 8.6 mg/dL — ABNORMAL LOW (ref 8.9–10.3)
Chloride: 110 mmol/L (ref 101–111)
Creatinine, Ser: 1.37 mg/dL — ABNORMAL HIGH (ref 0.44–1.00)
GFR calc Af Amer: 46 mL/min — ABNORMAL LOW (ref >60)
GFR calc non Af Amer: 40 mL/min — ABNORMAL LOW (ref >60)
Glucose, Bld: 134 mg/dL — ABNORMAL HIGH (ref 65–99)
Potassium: 4.1 mmol/L (ref 3.5–5.1)
Sodium: 140 mmol/L (ref 135–145)

## 2016-10-12 LAB — RAPID URINE DRUG SCREEN, HOSP PERFORMED
Amphetamines: NOT DETECTED
Barbiturates: NOT DETECTED
Benzodiazepines: NOT DETECTED
Cocaine: NOT DETECTED
Opiates: NOT DETECTED
Tetrahydrocannabinol: NOT DETECTED

## 2016-10-12 LAB — GLUCOSE, CAPILLARY
Glucose-Capillary: 145 mg/dL — ABNORMAL HIGH (ref 65–99)
Glucose-Capillary: 149 mg/dL — ABNORMAL HIGH (ref 65–99)
Glucose-Capillary: 154 mg/dL — ABNORMAL HIGH (ref 65–99)
Glucose-Capillary: 111 mg/dL — ABNORMAL HIGH (ref 65–99)
Glucose-Capillary: 134 mg/dL — ABNORMAL HIGH (ref 65–99)

## 2016-10-12 LAB — ETHANOL: Alcohol, Ethyl (B): <5 mg/dL (ref <5)

## 2016-10-12 LAB — MRSA PCR SCREENING: MRSA by PCR: POSITIVE — AB

## 2016-10-12 LAB — TROPONIN I
Troponin I: <0.03 ng/mL (ref <0.03)
Troponin I: <0.03 ng/mL (ref <0.03)

## 2016-10-12 LAB — D-DIMER, QUANTITATIVE: D-Dimer, Quant: 1.07 ug/mL-FEU — ABNORMAL HIGH (ref 0.00–0.50)

## 2016-10-12 MED ORDER — PANTOPRAZOLE SODIUM 40 MG PO TBEC
40.0000 mg | DELAYED_RELEASE_TABLET | Freq: Every day | ORAL | Status: DC
  Administered 2016-10-12 – 2016-10-13 (×2): 40 mg via ORAL
  Filled 2016-10-12 (×2): qty 1

## 2016-10-12 MED ORDER — MUPIROCIN 2 % EX OINT
1.0000 "application " | TOPICAL_OINTMENT | Freq: Two times a day (BID) | CUTANEOUS | Status: DC
  Administered 2016-10-12 – 2016-10-13 (×3): 1 via NASAL
  Filled 2016-10-12 (×2): qty 22

## 2016-10-12 MED ORDER — NALOXONE HCL 0.4 MG/ML IJ SOLN
0.4000 mg | INTRAMUSCULAR | Status: AC
  Administered 2016-10-12: 0.4 mg via INTRAVENOUS

## 2016-10-12 MED ORDER — CARVEDILOL 25 MG PO TABS
25.0000 mg | ORAL_TABLET | Freq: Two times a day (BID) | ORAL | Status: DC
  Administered 2016-10-12 – 2016-10-13 (×4): 25 mg via ORAL
  Filled 2016-10-12: qty 2
  Filled 2016-10-12 (×3): qty 1

## 2016-10-12 MED ORDER — DULOXETINE HCL 60 MG PO CPEP
60.0000 mg | ORAL_CAPSULE | Freq: Every day | ORAL | Status: DC
Start: 2016-10-12 — End: 2016-10-13
  Administered 2016-10-12 – 2016-10-13 (×2): 60 mg via ORAL
  Filled 2016-10-12 (×2): qty 1

## 2016-10-12 MED ORDER — SODIUM CHLORIDE 0.9 % IV SOLN
INTRAVENOUS | Status: AC
  Administered 2016-10-12: 01:00:00 via INTRAVENOUS

## 2016-10-12 MED ORDER — INSULIN ASPART 100 UNIT/ML ~~LOC~~ SOLN
0.0000 [IU] | Freq: Three times a day (TID) | SUBCUTANEOUS | Status: DC
  Administered 2016-10-12: 2 [IU] via SUBCUTANEOUS
  Administered 2016-10-12 (×2): 1 [IU] via SUBCUTANEOUS
  Administered 2016-10-13 (×2): 2 [IU] via SUBCUTANEOUS

## 2016-10-12 MED ORDER — DOCUSATE SODIUM 100 MG PO CAPS
100.0000 mg | ORAL_CAPSULE | Freq: Every day | ORAL | Status: DC
  Administered 2016-10-12 – 2016-10-13 (×2): 100 mg via ORAL
  Filled 2016-10-12 (×2): qty 1

## 2016-10-12 MED ORDER — NALOXONE HCL 0.4 MG/ML IJ SOLN
INTRAMUSCULAR | Status: AC
  Administered 2016-10-12: 0.4 mg via INTRAVENOUS
  Filled 2016-10-12: qty 1

## 2016-10-12 MED ORDER — ACETAMINOPHEN 325 MG PO TABS
650.0000 mg | ORAL_TABLET | Freq: Four times a day (QID) | ORAL | Status: DC | PRN
Start: 2016-10-12 — End: 2016-10-13
  Administered 2016-10-12 (×3): 650 mg via ORAL
  Filled 2016-10-12 (×3): qty 2

## 2016-10-12 MED ORDER — GABAPENTIN 300 MG PO CAPS: 300.0000 mg | ORAL_CAPSULE | Freq: Once | ORAL | Status: DC

## 2016-10-12 MED ORDER — FUROSEMIDE 20 MG PO TABS
20.0000 mg | ORAL_TABLET | Freq: Every day | ORAL | Status: DC
  Administered 2016-10-12 – 2016-10-13 (×2): 20 mg via ORAL
  Filled 2016-10-12 (×2): qty 1

## 2016-10-12 MED ORDER — AMLODIPINE BESYLATE 10 MG PO TABS
10.0000 mg | ORAL_TABLET | Freq: Every day | ORAL | Status: DC
  Administered 2016-10-12 – 2016-10-13 (×3): 10 mg via ORAL
  Filled 2016-10-12 (×2): qty 1
  Filled 2016-10-12: qty 2

## 2016-10-12 MED ORDER — SODIUM CHLORIDE 0.9% FLUSH
3.0000 mL | Freq: Two times a day (BID) | INTRAVENOUS | Status: DC
  Administered 2016-10-12 – 2016-10-13 (×4): 3 mL via INTRAVENOUS

## 2016-10-12 MED ORDER — ATORVASTATIN CALCIUM 20 MG PO TABS
20.0000 mg | ORAL_TABLET | Freq: Every day | ORAL | Status: DC
  Administered 2016-10-12: 20 mg via ORAL
  Filled 2016-10-12: qty 1

## 2016-10-12 MED ORDER — ASPIRIN 81 MG PO CHEW
81.0000 mg | CHEWABLE_TABLET | Freq: Every day | ORAL | Status: DC
  Administered 2016-10-12 – 2016-10-13 (×2): 81 mg via ORAL
  Filled 2016-10-12 (×2): qty 1

## 2016-10-12 MED ORDER — IOPAMIDOL (ISOVUE-370) INJECTION 76%
INTRAVENOUS | Status: AC
  Administered 2016-10-12: 80 mL
  Filled 2016-10-12: qty 100

## 2016-10-12 MED ORDER — LEVOTHYROXINE SODIUM 25 MCG PO TABS
25.0000 ug | ORAL_TABLET | Freq: Every day | ORAL | Status: DC
  Administered 2016-10-12 – 2016-10-13 (×2): 25 ug via ORAL
  Filled 2016-10-12 (×2): qty 1

## 2016-10-12 MED ORDER — ACETAMINOPHEN 650 MG RE SUPP: 650.0000 mg | Freq: Four times a day (QID) | RECTAL | Status: DC | PRN

## 2016-10-12 MED ORDER — CHLORHEXIDINE GLUCONATE CLOTH 2 % EX PADS
6.0000 | MEDICATED_PAD | Freq: Every day | CUTANEOUS | Status: DC
  Administered 2016-10-12 – 2016-10-13 (×2): 6 via TOPICAL

## 2016-10-12 MED ORDER — RISPERIDONE 1 MG PO TABS
1.0000 mg | ORAL_TABLET | Freq: Every day | ORAL | Status: DC
  Administered 2016-10-12: 1 mg via ORAL
  Filled 2016-10-12: qty 1

## 2016-10-12 MED ORDER — INSULIN ASPART 100 UNIT/ML ~~LOC~~ SOLN: 0.0000 [IU] | Freq: Every day | SUBCUTANEOUS | Status: DC

## 2016-10-12 MED ORDER — PRAZOSIN HCL 2 MG PO CAPS
2.0000 mg | ORAL_CAPSULE | Freq: Every day | ORAL | Status: DC
  Administered 2016-10-12: 2 mg via ORAL
  Filled 2016-10-12: qty 1

## 2016-10-12 MED ORDER — ENOXAPARIN SODIUM 40 MG/0.4ML ~~LOC~~ SOLN
40.0000 mg | SUBCUTANEOUS | Status: DC
  Administered 2016-10-12 – 2016-10-13 (×2): 40 mg via SUBCUTANEOUS
  Filled 2016-10-12 (×2): qty 0.4

## 2016-10-12 MED ORDER — OXYCODONE HCL 5 MG PO TABS
10.0000 mg | ORAL_TABLET | Freq: Once | ORAL | Status: AC
  Administered 2016-10-12: 10 mg via ORAL
  Filled 2016-10-12: qty 2

## 2016-10-12 NOTE — Progress Notes (Signed)
Shift event note:  Notified by RN regarding pt currently unresponsive. VSS and 02 sats 100% on r/a. RR RN was paged and responded to bedside. Orders placed for stat ABG and Narcan IV. CBG 134.  At bedside pt noted awake but not verbally responding. However pt does respond to deep sternal rub and my voice by turning her head to face me and holds her gaze briefly before looking away. Pt noted w/ constant chewing like motion . Per record pt rec'd Oxy IR 10 mg for pain around shift change at which point pt was awake and oriented per RN. [name] Kitchen Minimal response to IV Narcan.  There is no evidence of facial droop, seizure or respiratory distress.  ABG > 7.6/21.2/99.6/21.3. Pt is afebrile and VSS.  Assessment/Plan: 1. Encephalopathy: No electrolyte abnormalities or evidence of infection.  Neurology doubts subclinical seizures. UDS negative.  On several psychotropics, admitting MD felt possibly some degree of sedation from these, although that wouldn't explain a sudden change in mental status.  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 Continuing to hold trazodone, lorazepam, gabapentin and hydroxyzine. Will hold any further meds for pain. Per recommendation of Dr Cheral Marker no additional imaging. Will continue to monitor closely on telemetry.   Jeryl Columbia, NP-C [facility] Pager [phone]

## 2016-10-12 NOTE — Procedures (Signed)
Placed patient on CPAP for the night.  Patient is tolerating well at this time. 

## 2016-10-12 NOTE — Progress Notes (Signed)
Subjective: Feeling much improved and in good spirits  Exam: Vitals:   10/12/16 0327 10/12/16 0749  BP: (!) 179/63 (!) 169/64  Pulse: 66 65  Resp: 20 18  Temp: 97.6 F (36.4 C) 97.5 F (36.4 C)        Gen: In bed, NAD MS: alert and oriented CN:2-12 intact Motor: MAEW Sensory: intact   Pertinent Labs/Diagnostics: CT head was WNL showing no etiology for AMS   Impression: At this time patient is verbalizing well and in good spirits, no longer having chest pain or dizziness. Due to elevated D-dimer CTA chest and Aorta have been ordered.    Recommendations: At this time no further recommendations from a neurology stand point. Neurology S/O.   Etta Quill PA-C [facility] [phone]   10/12/2016, 10:28 AM

## 2016-10-12 NOTE — Progress Notes (Signed)
Pt. Placed on CPAP per home settings, Tolerating well

## 2016-10-12 NOTE — Significant Event (Signed)
Rapid Response Event Note  Overview: Time Called: 2249 Arrival Time: 2252 Event Type: Neurologic  Initial Focused Assessment:  Called by Danae Chen, RN that pt is unresponsive and having periods of apnea. LSN at 2245 when she was assisted to the [facility] and talking to staff. Pt was given OxyIR 10 mg at 2226.  Upon arrival to room pt lying in bed staring off to right side, making chewing motions with mouth, does not flinch to sternal rub.  Pupils 30mm  and sluggish bilaterally.Follows no command, No effort against gravity of all extremeties. Appears post ictal but  no incontinence of bowel or bladder and no tongue biting. VS: 98.0  198/74 SR 72  RR 26 100%O2sats on RA.     Interventions:  Obtain CBG: 134,  ABG:  7.6  21 99 21 on room air  Narcan 0.4 mg given  At 2311 per Chaney Malling, NP. No immediate improvement in neuro status noted until Chaney Malling, NP came to bedside at 2315 when pt tracked to her left side, but remained non verbal and not following commands.  Dr Cheral Marker, Neurologist was consulted via phone. Barring any change in pt's condition through the night he will order an EEG for the AM  Plan of Care (if not transferred):Continue to monitor neuro status q 1 hr through the night.  Call for any changes or concerns.  Hand off report given to Danae Chen, RN 3E  Event Summary: Name of Physician Notified: Chaney Malling, NP at 2300  Name of Consulting Physician Notified: Dr Kerney Elbe at 2315  Outcome: Stayed in room and stabalized     Leigha Olberding, [place]

## 2016-10-12 NOTE — Progress Notes (Signed)
Patient cooperative with care.  BP remains elevated.  No complaints voiced this shift. Marcille Blanco, RN

## 2016-10-12 NOTE — H&P (Signed)
History and Physical  Patient Name: [name]     LPF:[id number]    DOB: [birth year]    DOA: 10/11/2016 PCP: Kingsley Callander, FNP   Patient coming from: Assisted Living facility  Chief Complaint: Chest pain, altered mental status  HPI: Maleea Camilo is a 66 y.o. female with a past medical history significant for schizophrenia, NIDDM, OSA/OHS, hx of PE, HFpEF, and HTN who presents with chest pain and confusion.  Caveat that the patient was altered and nonverbal at arrival and her history remains difficult.  She states to me that she was in her normal health tonight, was doing a puzzle when she had onset of chest pain.  She is unable to elaborate on the character or intensity, but states that she also has lip tingling, whole tongue "numbness" and bilateral hand weakness.  Report from EMS suggests that she complained to staff at her facility or EMS personnel of dizziness and passing out, and she was altered and not responding to questions for EMS or ED staff initially.    ED course: -Afebrile, heart rate 65, respirations 29, BP 201/81, pulse ox 100% on room air -Na 136, K 4.4, Cr 1.5 (baseline 1.6), WBC 9.1K, Hgb 10.7 (at baseline) -Coags normal -Troponin negative -CT head showed NAICP -ECG showed no change from previous, no ST changes -CODE STROKE was called, but canceled by neurology on arrival, they found no focal neurologic deficits, recommended workup for metabolic encephalopathy and chest pain and TRH were asked to evaluate    The patient was admitted overnight in Dec for chest pain, had elevated D-dimer, VQ scan that was negative, negative LE dopplers, negative serial troponins.       ROS: Review of Systems  Cardiovascular: Positive for chest pain.  Neurological: Positive for dizziness, tingling (tongue), focal weakness (bilateral hands) and loss of consciousness. Negative for sensory change, speech change, seizures and headaches.  All other systems reviewed and are  negative.         Past Medical History:  Diagnosis Date  . Anxiety   . Asthma   . Back pain   . Barrett esophagus   . CKD (chronic kidney disease) stage 3, GFR 30-59 ml/min   . Depression   . Diastolic heart failure ([place])   . Essential hypertension   . Fibromyalgia   . GERD (gastroesophageal reflux disease)   . Gout   . History of pericarditis   . Hyperlipidemia   . Nephrolithiasis    2008  . Obesity hypoventilation syndrome (H[facility])    CPAP  . PE (pulmonary embolism)    2010  . Peripheral neuropathy ([place])   . Rheumatoid arthritis ([place])   . Schizophrenia ([place])   . Steroid dependence   . Type 2 diabetes mellitus ([place])   . Vitamin D deficiency     Past Surgical History:  Procedure Laterality Date  . APPENDECTOMY    . CHOLECYSTECTOMY    . COLONOSCOPY     benign polyps (12/2000), repeat colonoscopy performed in 2007  . ENDOMETRIAL BIOPSY     10/03 benign columnar mucosa (limited material)  . KIDNEY SURGERY      Social History: Patient lives at an assisted Living facility.  She walks with a walker.  She.   reports that she quit smoking about 44 years ago. Her smoking use included Cigarettes. She has never used smokeless tobacco. She reports that she does not drink alcohol or use drugs.  Allergies  Allergen Reactions  . Benztropine Mesylate Other (See  Comments)    REACTION: Alopecia and white spots on eyes  . Codeine Other (See Comments)    REACTION: Mouth Swelling - Tachycardia  . Diazepam Other (See Comments)    REACTION: COMA  . Diphenhydramine Hcl Other (See Comments)    REACTION: Tachycardia , anxiousness  . Penicillins Other (See Comments)    REACTION: ulcers in mouth, swelling, throat swelling  Has patient had a PCN reaction causing immediate rash, facial/tongue/throat swelling, SOB or lightheadedness with hypotension: YES Has patient had a PCN reaction causing severe rash involving Mucus membranes or skin necrosis: Unknown Has patient had a PCN reaction that  required hospitalization Unknown Has patient had a PCN reaction occurring within the last 10 years: unknown If all of the above answers are "NO", then may proceed with Cephalospori  . Sulfonamide Derivatives Other (See Comments)    REACTION: rash - blisters  . Thioridazine Hcl Other (See Comments)    REACTION: swelling  . Lisinopril Other (See Comments)    REACTION: cough    Family history: family history includes Bone cancer in her mother; COPD in her father; Heart disease in her father and mother; Hypertension in her mother; Stroke in her father.  Prior to Admission medications   Medication Sig Start Date End Date Taking? Authorizing Provider  acetaminophen (TYLENOL) 325 MG tablet Take 650 mg by mouth every 8 (eight) hours as needed for moderate pain.   Yes Historical Provider, MD  albuterol (PROVENTIL HFA;VENTOLIN HFA) 108 (90 Base) MCG/ACT inhaler Inhale 2 puffs into the lungs every 6 (six) hours as needed for wheezing or shortness of breath.   Yes Historical Provider, MD  albuterol (PROVENTIL) (5 MG/ML) 0.5% nebulizer solution Take 2.5 mg by nebulization every 6 (six) hours as needed for wheezing or shortness of breath.   Yes Historical Provider, MD  antiseptic oral rinse (BIOTENE) LIQD 15 mLs by Mouth Rinse route daily.   Yes Historical Provider, MD  aspirin 81 MG tablet Take 81 mg by mouth daily.   Yes Historical Provider, MD  atorvastatin (LIPITOR) 40 MG tablet Take 40 mg by mouth daily.    Yes Historical Provider, MD  budesonide-formoterol (SYMBICORT) 160-4.5 MCG/ACT inhaler Inhale 2 puffs into the lungs 2 (two) times daily.   Yes Historical Provider, MD  carvedilol (COREG) 25 MG tablet Take 25 mg by mouth 2 (two) times daily with a meal.   Yes Historical Provider, MD  cholecalciferol (VITAMIN D) 1000 units tablet Take 4,000 Units by mouth daily.   Yes Historical Provider, MD  docusate sodium (COLACE) 100 MG capsule Take 100 mg by mouth daily.    Yes Historical Provider, MD    DULoxetine (CYMBALTA) 60 MG capsule Take 60 mg by mouth daily.   Yes Historical Provider, MD  EPINEPHrine (EPI-PEN) 0.3 mg/0.3 mL DEVI Inject 0.3 mg into the muscle once.   Yes Historical Provider, MD  furosemide (LASIX) 20 MG tablet Take 1 tablet (20 mg total) by mouth daily. Patient taking differently: Take 40 mg by mouth daily.  08/14/16  Yes Geradine Girt, DO  gabapentin (NEURONTIN) 300 MG capsule Take 300 mg by mouth 2 (two) times daily.   Yes Historical Provider, MD  hydrOXYzine (VISTARIL) 25 MG capsule Take 25 mg by mouth 2 (two) times daily as needed for anxiety.   Yes Historical Provider, MD  ipratropium (ATROVENT) 0.02 % nebulizer solution Take 0.5 mg by nebulization every 6 (six) hours as needed for wheezing or shortness of breath.    Yes Historical  Provider, MD  levothyroxine (SYNTHROID, LEVOTHROID) 25 MCG tablet Take 25 mcg by mouth daily before breakfast.   Yes Historical Provider, MD  linagliptin (TRADJENTA) 5 MG TABS tablet Take 5 mg by mouth daily.   Yes Historical Provider, MD  lisinopril (PRINIVIL,ZESTRIL) 2.5 MG tablet Take 2.5 mg by mouth daily.   Yes Historical Provider, MD  LORazepam (ATIVAN) 1 MG tablet Take 1 mg by mouth daily as needed for anxiety.   Yes Historical Provider, MD  montelukast (SINGULAIR) 10 MG tablet Take 10 mg by mouth at bedtime.   Yes Historical Provider, MD  Multiple Vitamin (DAILY VITE PO) Take 1 tablet by mouth daily.   Yes Historical Provider, MD  NON FORMULARY 1 each by Other route See admin instructions. CPAP at bedtime. CPAP setting of 15CM of water   Yes Historical Provider, MD  nystatin cream (MYCOSTATIN) Apply 1 application topically 2 (two) times daily.   Yes Historical Provider, MD  omeprazole (PRILOSEC) 20 MG capsule Take 20 mg by mouth daily.    Yes Historical Provider, MD  prazosin (MINIPRESS) 2 MG capsule Take 2 mg by mouth at bedtime.   Yes Historical Provider, MD  pyridOXINE (VITAMIN B-6) 100 MG tablet Take 200 mg by mouth 2 (two)  times daily.   Yes Historical Provider, MD  risperiDONE (RISPERDAL) 1 MG tablet Take 1 mg by mouth at bedtime.   Yes Historical Provider, MD  spironolactone (ALDACTONE) 25 MG tablet Take 25 mg by mouth daily.   Yes Historical Provider, MD  Talc (BABY POWDER EX) Apply 1 application topically daily as needed (for rash).   Yes Historical Provider, MD  traZODone (DESYREL) 100 MG tablet Take 100 mg by mouth at bedtime as needed for sleep.    Yes Historical Provider, MD  amLODipine (NORVASC) 10 MG tablet Take 1 tablet (10 mg total) by mouth daily. Patient not taking: Reported on 10/12/2016 08/14/16   Geradine Girt, DO       Physical Exam: BP 185/66   Pulse 60   Temp 97.7 F (36.5 C) (Rectal)   Resp 20   SpO2 100%  General appearance: Well-developed, obese adult female, awake, makes eye contact, very slow to respond to questions, nearly catatonic.   Eyes: Anicteric, conjunctiva pink, lids and lashes normal. PERRL.    ENT: No nasal deformity, discharge, epistaxis.  Hearing seems normal. OP dry without lesions.   Neck: No neck masses.  Trachea midline.  No thyromegaly/tenderness. Lymph: No cervical or supraclavicular lymphadenopathy. Skin: Warm and dry.  No jaundice.  No suspicious rashes or lesions. Cardiac: RRR, nl S1-S2, no murmurs appreciated.  Capillary refill is brisk.  JVP not visible.  No LE edema.  Radial and DP pulses 2+ and symmetric. Respiratory: Tachypneic, shallow breaths.  CTAB without rales or wheezes. Abdomen: Abdomen soft.  No TTP. No ascites, distension, hepatosplenomegaly.   MSK: No deformities or effusions.  No cyanosis or clubbing. Neuro: Cranial nerves 3-12 intact.  Sensation intact to light touch. Speech is fluent.  Muscle strength seems globally and symetrically weak.  She follows commands slowly, bcannot ascertain if this is volitional. Hand grip and elbow flexion 4/5 bilaterally, symmetric.  Leg strength 3/5 and symmetric.  Reflexes diminished.  Will not do cerebellar  testing.  Will not or cannot sit up or stand.    Psych: Sensorium intact, making eye contact, aware of surroundings and oriented to hospital and responding to questions but very slowly and reluctantly, attention normal.  Behavior odd.  Affect flat.  Labs on Admission:  I have personally reviewed following labs and imaging studies: CBC:  Recent Labs Lab 10/11/16 2020 10/11/16 2026  WBC 9.1  --   NEUTROABS 5.8  --   HGB 10.7* 9.9*  HCT 30.8* 29.0*  MCV 91.7  --   PLT 184  --    Basic Metabolic Panel:  Recent Labs Lab 10/11/16 2020 10/11/16 2026  NA 136 139  K 4.4 4.4  CL 107 107  CO2 21*  --   GLUCOSE 199* 192*  BUN 32* 33*  CREATININE 1.50* 1.50*  CALCIUM 9.0  --    GFR: CrCl cannot be calculated (Unknown ideal weight.).  Liver Function Tests:  Recent Labs Lab 10/11/16 2020  AST 19  ALT 22  ALKPHOS 74  BILITOT 0.4  PROT 6.6  ALBUMIN 3.3*   No results for input(s): LIPASE, AMYLASE in the last 168 hours. No results for input(s): AMMONIA in the last 168 hours. Coagulation Profile:  Recent Labs Lab 10/11/16 2020  INR 1.02   Cardiac Enzymes: No results for input(s): CKTOTAL, CKMB, CKMBINDEX, TROPONINI in the last 168 hours. BNP (last 3 results) No results for input(s): PROBNP in the last 8760 hours. HbA1C: No results for input(s): HGBA1C in the last 72 hours. CBG:  Recent Labs Lab 10/11/16 2020  GLUCAP 176*   Lipid Profile: No results for input(s): CHOL, HDL, LDLCALC, TRIG, CHOLHDL, LDLDIRECT in the last 72 hours. Thyroid Function Tests: No results for input(s): TSH, T4TOTAL, FREET4, T3FREE, THYROIDAB in the last 72 hours. Anemia Panel: No results for input(s): VITAMINB12, FOLATE, FERRITIN, TIBC, IRON, RETICCTPCT in the last 72 hours. Sepsis Labs: Invalid input(s): PROCALCITONIN, LACTICIDVEN No results found for this or any previous visit (from the past 240 hour(s)).       Radiological Exams on Admission: Personally reviewed CT head  report: Ct Head Wo Contrast  Result Date: 10/11/2016 CLINICAL DATA:  Sudden onset chest pain, dizziness, altered level consciousness EXAM: CT HEAD WITHOUT CONTRAST TECHNIQUE: Contiguous axial images were obtained from the base of the skull through the vertex without intravenous contrast. COMPARISON:  MR brain 11/11/2015, CT brain 11/10/2015 FINDINGS: Brain: No evidence of acute infarction, hemorrhage, extra-axial collection, ventriculomegaly, or mass effect. Generalized cerebral atrophy. Periventricular white matter low attenuation likely secondary to microangiopathy. Vascular: Cerebrovascular atherosclerotic calcifications are noted. Skull: Negative for fracture or focal lesion. Sinuses/Orbits: Visualized portions of the orbits are unremarkable. Visualized portions of the paranasal sinuses and mastoid air cells are unremarkable. Other: None. IMPRESSION: No acute intracranial pathology. Electronically Signed   By: Kathreen Devoid   On: 10/11/2016 21:25    EKG: Independently reviewed. Rate 67, QTc 471, Bifasicular block, no change from previous, no ST changes.  Echo Nov 2017: EF 60-65% Grade I DD No valvular disease        Assessment/Plan  1. Chest pain:  Details are hard to elicit.  She is higher risk for CAD (given weight, DM, HTN) and PE (previous PE).   -Obtain CXR -Cycle troponins -Obtain d-dimer, if elevated, will obtain VQ scan   2. Metabolic encephalopathy:  No electrolyte abnormalities or evidence of infection.  Neurology doubts subclinical seizures. UDS negative.  On several psychotropics, perhaps there is some degree of sedation from these, although that wouldn't explain a sudden change in mental status.  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 -Hold trazodone, lorazepam -Hold gabapentin, hydroxyzine  3. Hypertension:  Uncontrolled at admission. -Continue amlodipine, carvedilol, furosemide -Continue statin, aspriin  4. Non-insulin dependent diabetes:  Slightly hyperglycemic at admission. -Hold Tradjenta -SSI corrections with meals  5. Schizophrenia:  -Continue Risperdal -Continue prazosin -Continue duloxetine -Hold other psychotropics for 24 hours  6. Chronic diastolic CHF:  Euvolvemic on exam. -Continue furosemide  7. OHS/OSA:  -Continue CPAP  8. Hypothyroidism: -Continue levothyroxine  9. Other medications: -Continue PPI      DVT prophylaxis: Lovenox  Code Status: FULL  Family Communication: None present  Disposition Plan: Anticipate cycle enzymes, check d-dimer, follow up imaging if needed.  If testing negative, monitor for resolution of encephalopathy. Consults called: Neurology Admission status: OBS At the point of initial evaluation, it is my clinical opinion that admission for OBSERVATION is reasonable and necessary because the patient's presenting complaints in the context of their chronic conditions represent sufficient risk of deterioration or significant morbidity to constitute reasonable grounds for close observation in the hospital setting, but that the patient may be medically stable for discharge from the hospital within 24 to 48 hours.    Medical decision making: Patient seen at 11:40 PM on 10/11/2016.  The patient was discussed with Dr. Jeanell Sparrow.  What exists of the patient's chart was reviewed in depth and summarized above.  Clinical condition: stable.        Edwin Dada [facility] Pager [phone]

## 2016-10-12 NOTE — NC FL2 (Signed)
[place] LEVEL OF CARE SCREENING TOOL     IDENTIFICATION  Patient Name: [name] Birthdate: 1951/08/04 Sex: female Admission Date (Current Location): 10/11/2016  [facility] and [place] Number:  [employer] and Address:  [facility] [place], [place] [address], [place], [place] [address]      Provider Number: [id number]  Attending Physician Name and Address:  Florencia Reasons, MD  Relative Name and Phone Number:       Current Level of Care: Hospital Recommended Level of Care: [place] Prior Approval Number:    Date Approved/Denied:   PASRR Number:    Discharge Plan: Other (Comment) (ALF)    Current Diagnoses: Patient Active Problem List   Diagnosis Date Noted  . Altered mental status   . Encephalopathy 10/11/2016  . Chest pain 08/12/2016  . CKD (chronic kidney disease) stage 3, GFR 30-59 ml/min 08/12/2016  . History of pulmonary embolus (PE) 08/12/2016  . Obesity hypoventilation syndrome ([place]) 08/12/2016  . RBBB 08/12/2016  . Positive D dimer 08/12/2016  . CVA (cerebral infarction) 11/10/2015  . Physical deconditioning 07/01/2011  . Weakness 07/01/2011  . Diabetic foot ulcer ([place]) 05/19/2011  . Polypharmacy 02/09/2011  . BACK PAIN WITH RADICULOPATHY 05/25/2010  . Type 2 diabetes mellitus with diabetic polyneuropathy, without long-term current use of insulin ([place]) 08/18/2009  . Chronic diastolic heart failure ([place]) 04/25/2009  . Anemia 04/14/2009  . Sleep apnea 12/11/2008  . Diabetic peripheral neuropathy ([place]) 05/04/2007  . HYPERCHOLESTEROLEMIA 10/13/2006  . Gout, unspecified 10/13/2006  . HYPOKALEMIA 10/13/2006  . Schizophrenia ([place]) 10/13/2006  . Depression 10/13/2006  . Essential hypertension 10/13/2006  . Venous (peripheral) insufficiency 10/13/2006  . RHINITIS, ALLERGIC 10/13/2006  . Asthma 10/13/2006  . Reflux esophagitis 10/13/2006  . OSTEOARTHRITIS, MULTI SITES 10/13/2006  . INCONTINENCE, URGE  10/13/2006    Orientation RESPIRATION BLADDER Height & Weight     Self, Time, Situation, Place  Normal, Other (Comment) (CPAP at night: Respiratory rate (18), EPAP (15), Oxygen Percentage (21)) Incontinent Weight: 201 lb 8 oz (91.4 kg) Height:  5' (152.4 cm)  BEHAVIORAL SYMPTOMS/MOOD NEUROLOGICAL BOWEL NUTRITION STATUS   (None)  (History of CVA) Continent Diet (Heart healthy/carb modified)  AMBULATORY STATUS COMMUNICATION OF NEEDS Skin     Verbally Normal                       Personal Care Assistance Level of Assistance              Functional Limitations Info  Sight, Hearing, Speech Sight Info: Adequate Hearing Info: Adequate Speech Info: Adequate    SPECIAL CARE FACTORS FREQUENCY  Blood pressure                    Contractures Contractures Info: Not present    Additional Factors Info  Code Status, Allergies, Psychotropic, Isolation Precautions Code Status Info: Full Allergies Info: Benztropine Mesylate, Codeine, Diazepam, Diphenhydramine Hcl, Penicillins, Sulfonamide Derivatives, Thioridazine Hcl, Lisinopril Psychotropic Info: Schizophrenia, Depression: Cymbalta DR 60 mg PO daily, Risperdal 1 mg PO QHS   Isolation Precautions Info: Contact: MRSA     Current Medications (10/12/2016):  This is the current hospital active medication list Current Facility-Administered Medications  Medication Dose Route Frequency Provider Last Rate Last Dose  . acetaminophen (TYLENOL) tablet 650 mg  650 mg Oral Q6H PRN Edwin Dada, MD   650 mg at 10/12/16 1118   Or  . acetaminophen (TYLENOL) suppository 650 mg  650 mg Rectal Q6H PRN Edwin Dada, MD      . amLODipine (NORVASC) tablet 10 mg  10 mg Oral Daily Edwin Dada, MD   10 mg at 10/12/16 1118  . aspirin chewable tablet 81 mg  81 mg Oral Daily Edwin Dada, MD   81 mg at 10/12/16 1119  . atorvastatin (LIPITOR) tablet 20 mg  20 mg Oral q1800 Edwin Dada, MD      .  carvedilol (COREG) tablet 25 mg  25 mg Oral BID WC Edwin Dada, MD   25 mg at 10/12/16 0859  . Chlorhexidine Gluconate Cloth 2 % PADS 6 each  6 each Topical Q0600 Florencia Reasons, MD      . docusate sodium (COLACE) capsule 100 mg  100 mg Oral Daily Edwin Dada, MD   100 mg at 10/12/16 1118  . DULoxetine (CYMBALTA) DR capsule 60 mg  60 mg Oral Daily Edwin Dada, MD   60 mg at 10/12/16 1117  . enoxaparin (LOVENOX) injection 40 mg  40 mg Subcutaneous Q24H Edwin Dada, MD   40 mg at 10/12/16 1119  . furosemide (LASIX) tablet 20 mg  20 mg Oral Daily Edwin Dada, MD   20 mg at 10/12/16 1118  . insulin aspart (novoLOG) injection 0-5 Units  0-5 Units Subcutaneous QHS Edwin Dada, MD      . insulin aspart (novoLOG) injection 0-9 Units  0-9 Units Subcutaneous TID WC Edwin Dada, MD   1 Units at 10/12/16 1237  . levothyroxine (SYNTHROID, LEVOTHROID) tablet 25 mcg  25 mcg Oral QAC breakfast Edwin Dada, MD   25 mcg at 10/12/16 5956  . mupirocin ointment (BACTROBAN) 2 % 1 application  1 application Nasal BID Florencia Reasons, MD   1 application at 38/75/64 1120  . pantoprazole (PROTONIX) EC tablet 40 mg  40 mg Oral Daily Edwin Dada, MD   40 mg at 10/12/16 1118  . prazosin (MINIPRESS) capsule 2 mg  2 mg Oral QHS Edwin Dada, MD      . risperiDONE (RISPERDAL) tablet 1 mg  1 mg Oral QHS Edwin Dada, MD      . sodium chloride flush (NS) 0.9 % injection 3 mL  3 mL Intravenous Q12H Edwin Dada, MD   3 mL at 10/12/16 1119     Discharge Medications: Please see discharge summary for a list of discharge medications.  Relevant Imaging Results:  Relevant Lab Results:   Additional Information SS#: [id number]  Candie Chroman, LCSW

## 2016-10-12 NOTE — Progress Notes (Addendum)
PROGRESS NOTE  [name] ZOX:[id number] DOB: 1950/09/18 DOA: 10/11/2016 PCP: Kingsley Callander, FNP  HPI/Recap of past 24 hours:  She is very slow in answering questions, but report feeling better I noticed a few dry cough during my exam  Assessment/Plan: Principal Problem:   Chest pain Active Problems:   Type 2 diabetes mellitus with diabetic polyneuropathy, without long-term current use of insulin (HCC)   Schizophrenia (HCC)   Essential hypertension   Chronic diastolic heart failure (HCC)   Sleep apnea   CKD (chronic kidney disease) stage 3, GFR 30-59 ml/min   Encephalopathy   Altered mental status   Chest pain: appreantly she was admitted to the hospital two months ago for similar symptoms She is a very poor historian, Details are hard to elicit.   She is higher risk for CAD (given weight, DM, HTN) and PE (previous PE).   -negative troponin, no acute ekg changes,  -ddimer  Elevated, CTA ordered to r/o PE -will need outpatient stress test.   2. Metabolic encephalopathy:  No electrolyte abnormalities or evidence of infection.  Neurology doubts subclinical seizures. UDS negative.  On several psychotropics, perhaps there is some degree of sedation from these, although that wouldn't explain a sudden change in mental status.  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 -Hold trazodone, lorazepam -Hold gabapentin, hydroxyzine -improved, neurology signed off  3. Hypertension:  Uncontrolled at admission. -Continue amlodipine, carvedilol, furosemide -Continue statin, aspriin  4. Non-insulin dependent diabetes:  Slightly hyperglycemic at admission. -Hold Tradjenta -SSI corrections with meals  5. Schizophrenia:  -Continue Risperdal -Continue prazosin -Continue duloxetine -Hold other psychotropics for 24 hours  6. Chronic diastolic CHF:  Euvolvemic on exam. -Continue furosemide  7. OHS/OSA:  -Continue CPAP  8. Hypothyroidism: -Continue  levothyroxine  9. Other medications: -Continue PPI  morbid obesity: Body mass index is 39.35 kg/m.  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  DVT prophylaxis: Lovenox  Code Status: FULL  Family Communication: None present  Disposition Plan: back to alf, PT eval pending Consults called: Neurology  Procedures:  none  Antibiotics:  none   Objective: BP (!) 172/68 (BP Location: Right Arm)   Pulse 66   Temp 98 F (36.7 C) (Oral)   Resp 18   Ht 5' (1.524 m)   Wt 91.4 kg (201 lb 8 oz)   SpO2 100%   BMI 39.35 kg/m   Intake/Output Summary (Last 24 hours) at 10/12/16 2325 Last data filed at 10/12/16 2212  Gross per 24 hour  Intake          1196.25 ml  Output             2600 ml  Net         -1403.75 ml   Filed Weights   10/12/16 0327  Weight: 91.4 kg (201 lb 8 oz)    Exam:   General:  Obese, NAD  Cardiovascular: RRR  Respiratory: CTABL  Abdomen: Soft/ND/NT, positive BS  Musculoskeletal: trace pitting Edema  Neuro: aaox3, but very slow in answering questions  Data Reviewed: Basic Metabolic Panel:  Recent Labs Lab 10/11/16 2020 10/11/16 2026 10/12/16 0554  NA 136 139 140  K 4.4 4.4 4.1  CL 107 107 110  CO2 21*  --  21*  GLUCOSE 199* 192* 134*  BUN 32* 33* 29*  CREATININE 1.50* 1.50* 1.37*  CALCIUM 9.0  --  8.6*   Liver Function Tests:  Recent Labs Lab 10/11/16 2020  AST 19  ALT 22  ALKPHOS 74  BILITOT 0.4  PROT 6.6  ALBUMIN 3.3*   No results for input(s): LIPASE, AMYLASE in the last 168 hours. No results for input(s): AMMONIA in the last 168 hours. CBC:  Recent Labs Lab 10/11/16 2020 10/11/16 2026 10/12/16 0554  WBC 9.1  --  7.6  NEUTROABS 5.8  --   --   HGB 10.7* 9.9* 10.0*  HCT 30.8* 29.0* 29.4*  MCV 91.7  --  91.6  PLT 184  --  177   Cardiac Enzymes:    Recent Labs Lab 10/12/16 0034 10/12/16 0554  TROPONINI <0.03 <0.03   BNP (last 3 results)  Recent Labs  08/12/16 0859    BNP 176.6*    ProBNP (last 3 results) No results for input(s): PROBNP in the last 8760 hours.  CBG:  Recent Labs Lab 10/12/16 0746 10/12/16 1151 10/12/16 1709 10/12/16 2140 10/12/16 2257  GLUCAP 145* 149* 154* 111* 134*    Recent Results (from the past 240 hour(s))  MRSA PCR Screening     Status: Abnormal   Collection Time: 10/12/16  3:16 AM  Result Value Ref Range Status   MRSA by PCR POSITIVE (A) NEGATIVE Final    Comment:        The GeneXpert MRSA Assay (FDA approved for NASAL specimens only), is one component of a comprehensive MRSA colonization surveillance program. It is not intended to diagnose MRSA infection nor to guide or monitor treatment for MRSA infections. RESULT CALLED TO, READ BACK BY AND VERIFIED WITH: J THOMAS,RN @0655  10/12/16 MKELLY,MLT      Studies: Dg Chest 2 View  Result Date: 10/12/2016 CLINICAL DATA:  Acute onset chest pain, dizziness and altered level of consciousness at 1830 hours. History of CHF, pulmonary embolism, diabetes. EXAM: CHEST  2 VIEW COMPARISON:  CT chest August 15, 2016 FINDINGS: Cardiac silhouette appears mildly enlarged even with consideration to this low inspiratory AP technique with crowded vascular markings. Mild bronchitic changes. Elevated RIGHT hemidiaphragm without pleural effusion or focal consolidation. No pneumothorax. High-riding humeral heads associated with old rotator cuff injury though, may be projectional considering apical lordotic view. Large body habitus. IMPRESSION: Mild cardiomegaly.  Mild bronchitic changes. Electronically Signed   By: Elon Alas M.D.   On: 10/12/2016 02:01   Ct Angio Chest Pe W Or Wo Contrast  Result Date: 10/12/2016 CLINICAL DATA:  Chest pain, dizziness, elevated D-dimer EXAM: CT ANGIOGRAPHY CHEST WITH CONTRAST TECHNIQUE: Multidetector CT imaging of the chest was performed using the standard protocol during bolus administration of intravenous contrast. Multiplanar CT image  reconstructions and MIPs were obtained to evaluate the vascular anatomy. CONTRAST:  80 cc Isovue COMPARISON:  None. FINDINGS: Cardiovascular: Cardiomegaly is noted. No pericardial effusion. No aortic aneurysm. No definite pulmonary embolus is noted. No pulmonary embolus in lobar arterial branches. Suboptimal evaluation of segmental arterial branches due to artifacts from patient's large body habitus. Atherosclerotic calcifications are noted thoracic aorta and coronary arteries. Mediastinum/Nodes: There is no mediastinal hematoma or adenopathy. Lungs/Pleura: Images of the lung parenchyma shows no infiltrate or pulmonary edema. Minimal dependent atelectasis lung bases posteriorly. No bronchiectasis. No fibrotic changes. No emphysema. No pneumothorax. Upper Abdomen: The visualized upper abdomen shows mild fatty infiltration of the liver. Small hiatal hernia. No adrenal gland mass is noted. Partial fatty replacement of the pancreas. Visualized spleen is unremarkable. Musculoskeletal: No destructive bony lesions are noted. Sagittal images of the spine shows mild degenerative changes thoracic spine. Sagittal view of the sternum is unremarkable. Review of the MIP images  confirms the above findings. IMPRESSION: 1. No definite pulmonary embolus is noted. Suboptimal evaluation of segmental arterial branches due to artifacts from patient's large body habitus. 2. No infiltrate or pulmonary edema. No mediastinal hematoma or adenopathy. 3. Small hiatal hernia. 4. Cardiomegaly is noted. Atherosclerotic calcifications of thoracic aorta and coronary arteries. 5. Mild fatty infiltration of the liver. Electronically Signed   By: Lahoma Crocker M.D.   On: 10/12/2016 16:23    Scheduled Meds: . amLODipine  10 mg Oral Daily  . aspirin  81 mg Oral Daily  . atorvastatin  20 mg Oral q1800  . carvedilol  25 mg Oral BID WC  . Chlorhexidine Gluconate Cloth  6 each Topical Q0600  . docusate sodium  100 mg Oral Daily  . DULoxetine  60 mg  Oral Daily  . enoxaparin (LOVENOX) injection  40 mg Subcutaneous Q24H  . furosemide  20 mg Oral Daily  . insulin aspart  0-5 Units Subcutaneous QHS  . insulin aspart  0-9 Units Subcutaneous TID WC  . levothyroxine  25 mcg Oral QAC breakfast  . mupirocin ointment  1 application Nasal BID  . pantoprazole  40 mg Oral Daily  . prazosin  2 mg Oral QHS  . risperiDONE  1 mg Oral QHS  . sodium chloride flush  3 mL Intravenous Q12H    Continuous Infusions:   Time spent: 85mins  Tuck Dulworth MD, PhD  [facility] Pager [phone]. If 7PM-7AM, please contact night-coverage at [email or web address], password [facility] 10/12/2016, 11:25 PM  LOS: 0 days

## 2016-10-12 NOTE — Progress Notes (Signed)
Patient came to unit with no complaints, had a urinary incontinence episode-was cleaned up and gown/linen changed. Patient oriented to unit, updated on plan of care, call bell in reach, CCMD called.

## 2016-10-12 NOTE — Clinical Social Work Note (Signed)
Clinical Social Work Assessment  Patient Details  Name: [name] MRN: [id number] Date of Birth: September 30, 1950  Date of referral:  10/12/16               Reason for consult:  Discharge Planning                Permission sought to share information with:  Facility Sport and exercise psychologist, Family Supports Permission granted to share information::  Yes, Verbal Permission Granted  Name::     [name]  Agency::  [facility]  Relationship::  Niece  Contact Information:  [phone]  Housing/Transportation Living arrangements for the past 2 months:  [place] of Information:  Patient, Medical Team Patient Interpreter Needed:  None Criminal Activity/Legal Involvement Pertinent to Current Situation/Hospitalization:  No - Comment as needed Significant Relationships:  Other Family Members Lives with:  Facility Resident Do you feel safe going back to the place where you live?  Yes Need for family participation in patient care:  Yes (Comment)  Care giving concerns:  Patient is from [place] in [place].   Social Worker assessment / plan:  CSW met with patient. No supports at bedside. CSW introduced role and explained that discharge planning would be discussed. Patient confirmed that she was admitted from [facility] and plans to return upon discharge. She is unsure whether they will be able to transport her or not. No further concerns. CSW encouraged patient to contact CSW as needed. CSW will continue to follow patient for support and facilitate discharge back to ALF once medically stable.  Employment status:  Disabled (Comment on whether or not currently receiving Disability) Insurance information:  Medicaid [place] PT Recommendations:  Not assessed at this time Information / Referral to community resources:  Other (Comment Required) (Plan will return to ALF)  Patient/Family's Response to care:  Patient agreeable to return to ALF. Patient's niece  supportive and involved in patient's care. Patient appreciated social work intervention.  Patient/Family's Understanding of and Emotional Response to Diagnosis, Current Treatment, and Prognosis:  Patient appears to have a good understanding of the reason for admission. Patient appears happy with hospital care.  Emotional Assessment Appearance:  Appears stated age Attitude/Demeanor/Rapport:  Other (Pleasant) Affect (typically observed):  Accepting, Appropriate, Calm, Pleasant Orientation:  Oriented to Self, Oriented to Place, Oriented to  Time, Oriented to Situation Alcohol / Substance use:  Never Used Psych involvement (Current and /or in the community):  No (Comment)  Discharge Needs  Concerns to be addressed:  Care Coordination Readmission within the last 30 days:  No Current discharge risk:  None Barriers to Discharge:  Continued Medical Work up   Candie Chroman, LCSW 10/12/2016, 2:51 PM

## 2016-10-13 ENCOUNTER — Ambulatory Visit: Payer: Medicaid Other | Admitting: Podiatry

## 2016-10-13 ENCOUNTER — Observation Stay (HOSPITAL_COMMUNITY): Payer: Medicare Other

## 2016-10-13 DIAGNOSIS — G9341 Metabolic encephalopathy: Secondary | ICD-10-CM | POA: Diagnosis not present

## 2016-10-13 DIAGNOSIS — N183 Chronic kidney disease, stage 3 (moderate): Secondary | ICD-10-CM | POA: Diagnosis not present

## 2016-10-13 DIAGNOSIS — R0789 Other chest pain: Secondary | ICD-10-CM

## 2016-10-13 DIAGNOSIS — R079 Chest pain, unspecified: Secondary | ICD-10-CM | POA: Diagnosis not present

## 2016-10-13 LAB — BASIC METABOLIC PANEL
Anion gap: 8 (ref 5–15)
BUN: 24 mg/dL — ABNORMAL HIGH (ref 6–20)
CO2: 24 mmol/L (ref 22–32)
Calcium: 9.1 mg/dL (ref 8.9–10.3)
Chloride: 108 mmol/L (ref 101–111)
Creatinine, Ser: 1.62 mg/dL — ABNORMAL HIGH (ref 0.44–1.00)
GFR calc Af Amer: 37 mL/min — ABNORMAL LOW (ref >60)
GFR calc non Af Amer: 32 mL/min — ABNORMAL LOW (ref >60)
Glucose, Bld: 154 mg/dL — ABNORMAL HIGH (ref 65–99)
Potassium: 3.8 mmol/L (ref 3.5–5.1)
Sodium: 140 mmol/L (ref 135–145)

## 2016-10-13 LAB — GLUCOSE, CAPILLARY
Glucose-Capillary: 155 mg/dL — ABNORMAL HIGH (ref 65–99)
Glucose-Capillary: 168 mg/dL — ABNORMAL HIGH (ref 65–99)

## 2016-10-13 LAB — TSH: TSH: 4.899 u[IU]/mL — ABNORMAL HIGH (ref 0.350–4.500)

## 2016-10-13 MED ORDER — FUROSEMIDE 20 MG PO TABS: 20.0000 mg | ORAL_TABLET | Freq: Every day | ORAL | 0 refills | Status: DC

## 2016-10-13 NOTE — Progress Notes (Signed)
During hourly round. Patient is up and talking with  Nurse. Answers questions appropriately. Patient reports no pain. Patient is alert and oriented x3. Will continue to monitor.

## 2016-10-13 NOTE — Clinical Social Work Note (Signed)
CSW facilitated patient discharge including contacting patient family and facility to confirm patient discharge plans. Clinical information faxed to facility and family agreeable with plan. CSW arranged ambulance transport via [facility] to [facility] at 1:30 pm. RN to call report prior to discharge ([phone]).  CSW will sign off for now as social work intervention is no longer needed. Please consult [place] again if new needs arise.  Dayton Scrape, [place]

## 2016-10-13 NOTE — Clinical Social Work Note (Addendum)
CSW called [facility] to discuss discharge plan for today. CSW spoke with staff who voiced concern of patient's MRSA and her sharing a room with someone. Discussed with MD and Surveyor, quantity of social work. Patient is safe for return to ALF. CSW called facility to notify them. No answer. Will try again.  Dayton Scrape, CSW [phone]  11:54 am CSW discussed MD conversation with ALF staff. They are agreeable to patient returning. Patient will need PTAR.  Dayton Scrape, [place]

## 2016-10-13 NOTE — Procedures (Signed)
ELECTROENCEPHALOGRAM REPORT  Date of Study: 10/13/2016  Patient's Name: [name] MRN: [id number] Date of Birth: 1950/12/14  Referring Provider: Dr. Kerney Elbe  Clinical History: This is a 66 year old woman with altered mental status.  Medications: acetaminophen (TYLENOL) tablet 650 mg  amLODipine (NORVASC) tablet 10 mg  aspirin chewable tablet 81 mg  atorvastatin (LIPITOR) tablet 20 mg  carvedilol (COREG) tablet 25 mg  DULoxetine (CYMBALTA) DR capsule 60 mg  furosemide (LASIX) tablet 20 mg  insulin aspart (novoLOG) injection 0-5 Units  insulin aspart (novoLOG) injection 0-9 Units  levothyroxine (SYNTHROID, LEVOTHROID) tablet 25 mcg  pantoprazole (PROTONIX) EC tablet 40 mg  prazosin (MINIPRESS) capsule 2 mg  risperiDONE (RISPERDAL) tablet 1 mg   Technical Summary: A multichannel digital EEG recording measured by the international 10-20 system with electrodes applied with paste and impedances below 5000 ohms performed in our laboratory with EKG monitoring in an awake and drowsy patient.  Hyperventilation and photic stimulation were not performed.  The digital EEG was referentially recorded, reformatted, and digitally filtered in a variety of bipolar and referential montages for optimal display.    Description: The patient is awake and drowsy during the recording.  During maximal wakefulness, there is a symmetric, medium voltage 8.5-9 Hz posterior dominant rhythm that attenuates with eye opening.  There is frontal intermittent rhythmic delta activity (FIRDA) seen occasionally throughout the study, at times with shifting asymmetry, right>left. During drowsiness, there is an increase in theta and delta slowing of the background. Deeper stages of sleep were not seen.  Hyperventilation and photic stimulation were not performed.  There were no epileptiform discharges or electrographic seizures seen.    EKG lead was unremarkable.  Impression: This awake and drowsy EEG is abnormal  due to the presence of bursts of frontal intermittent rhythmic delta activity (FIRDA).  Clinical Correlation FIRDA can be seen with neuronal dysfunction from metabolic encephalopathy, ischemic injury, increased intracranial pressure, or from deep diencephalic or brainstem lesions. Clinical correlation is advised.   Ellouise Newer, M.D.

## 2016-10-13 NOTE — Evaluation (Signed)
Physical Therapy Evaluation Patient Details Name: [name] MRN: [id number] DOB: Mar 12, 1951 Today's Date: 10/13/2016   History of Present Illness  Pt is a 66 y/o female admitted secondary to chest pain and metabolic encephalopathy. PMH including but not limited to DM, CKD, HTN and peripheral neuropathy.  Clinical Impression  Pt presented supine in bed with HOB minimally elevated, awake and willing to participate in therapy session. Prior to admission, pt reported that she ambulates with mod I using a RW and requires assistance from caregivers at her ALF for ADLs. Pt currently ambulating 300' with RW with min guard for safety. PT will continue to follow pt acutely to ensure a safe d/c home.     Follow Up Recommendations No PT follow up    Equipment Recommendations  None recommended by PT    Recommendations for Other Services       Precautions / Restrictions Precautions Precautions: Fall Restrictions Weight Bearing Restrictions: No      Mobility  Bed Mobility Overal bed mobility: Needs Assistance Bed Mobility: Supine to Sit;Sit to Supine     Supine to sit: Min guard Sit to supine: Min guard   General bed mobility comments: increased time, min guard for safety  Transfers Overall transfer level: Needs assistance Equipment used: Rolling walker (2 wheeled) Transfers: Sit to/from Stand Sit to Stand: Supervision         General transfer comment: increased time, good technique, supervision for safety  Ambulation/Gait Ambulation/Gait assistance: Min guard Ambulation Distance (Feet): 300 Feet Assistive device: Rolling walker (2 wheeled) Gait Pattern/deviations: Step-through pattern;Decreased stride length;Trunk flexed Gait velocity: decreased Gait velocity interpretation: Below normal speed for age/gender General Gait Details: no instability with use of RW, pt steady with ambulation, min guard for safety  Stairs            Wheelchair Mobility    Modified  [place] (Stroke Patients Only)       Balance Overall balance assessment: Needs assistance Sitting-balance support: Feet supported Sitting balance-Leahy Scale: Good     Standing balance support: During functional activity Standing balance-Leahy Scale: Fair                               Pertinent Vitals/Pain Pain Assessment: No/denies pain    Home Living Family/patient expects to be discharged to:: Assisted living               Home Equipment: Walker - 2 wheels;Shower seat      Prior Function Level of Independence: Needs assistance   Gait / Transfers Assistance Needed: pt ambulates with use of RW  ADL's / Homemaking Assistance Needed: assistance needed with bathing/dressing        Hand Dominance        Extremity/Trunk Assessment   Upper Extremity Assessment Upper Extremity Assessment: Overall WFL for tasks assessed    Lower Extremity Assessment Lower Extremity Assessment: Generalized weakness    Cervical / Trunk Assessment Cervical / Trunk Assessment: Kyphotic  Communication   Communication: No difficulties  Cognition Arousal/Alertness: Awake/alert Behavior During Therapy: WFL for tasks assessed/performed Overall Cognitive Status: Within Functional Limits for tasks assessed                 General Comments: pt alert and oriented, following commands appropriately    General Comments      Exercises     Assessment/Plan    PT Assessment Patient needs continued PT services  PT Problem List Decreased  strength;Decreased balance;Decreased mobility;Decreased coordination;Decreased knowledge of use of DME;Decreased safety awareness       PT Treatment Interventions DME instruction;Gait training;Stair training;Functional mobility training;Therapeutic activities;Therapeutic exercise;Balance training;Neuromuscular re-education;Patient/family education    PT Goals (Current goals can be found in the Care Plan section)  Acute Rehab PT  Goals Patient Stated Goal: return to her ALF PT Goal Formulation: With patient Time For Goal Achievement: 10/27/16 Potential to Achieve Goals: Good    Frequency Min 3X/week   Barriers to discharge        Co-evaluation               End of Session Equipment Utilized During Treatment: Gait belt Activity Tolerance: Patient tolerated treatment well Patient left: in bed;with call bell/phone within reach Nurse Communication: Mobility status PT Visit Diagnosis: Other abnormalities of gait and mobility (R26.89);Muscle weakness (generalized) (M62.81)    Functional Assessment Tool Used: AM-PAC 6 Clicks Basic Mobility;Clinical judgement Functional Limitation: Mobility: Walking and moving around Mobility: Walking and Moving Around Current Status (G9842): At least 1 percent but less than 20 percent impaired, limited or restricted Mobility: Walking and Moving Around Goal Status [phone]): 0 percent impaired, limited or restricted    Time: 8118-8677 PT Time Calculation (min) (ACUTE ONLY): 18 min   Charges:   PT Evaluation $PT Eval Low Complexity: 1 Procedure     PT G Codes:   PT G-Codes **NOT FOR INPATIENT CLASS** Functional Assessment Tool Used: AM-PAC 6 Clicks Basic Mobility;Clinical judgement Functional Limitation: Mobility: Walking and moving around Mobility: Walking and Moving Around Current Status (J7366): At least 1 percent but less than 20 percent impaired, limited or restricted Mobility: Walking and Moving Around Goal Status [phone]): 0 percent impaired, limited or restricted     [facility] 10/13/2016, 1:51 PM Sherie Don, [place], DPT [phone]

## 2016-10-13 NOTE — Discharge Instructions (Signed)
Amlodipine tablets What is this medicine? AMLODIPINE (am LOE di peen) is a calcium-channel blocker. It affects the amount of calcium found in your heart and muscle cells. This relaxes your blood vessels, which can reduce the amount of work the heart has to do. This medicine is used to lower high blood pressure. It is also used to prevent chest pain. This medicine may be used for other purposes; ask your health care provider or pharmacist if you have questions. COMMON BRAND NAME(S): Norvasc What should I tell my health care provider before I take this medicine? They need to know if you have any of these conditions: -heart problems like heart failure or aortic stenosis -liver disease -an unusual or allergic reaction to amlodipine, other medicines, foods, dyes, or preservatives -pregnant or trying to get pregnant -breast-feeding How should I use this medicine? Take this medicine by mouth with a glass of water. Follow the directions on the prescription label. Take your medicine at regular intervals. Do not take more medicine than directed. Talk to your pediatrician regarding the use of this medicine in children. Special care may be needed. This medicine has been used in children as young as 6. Persons over 65 years old may have a stronger reaction to this medicine and need smaller doses. Overdosage: If you think you have taken too much of this medicine contact a poison control center or emergency room at once. NOTE: This medicine is only for you. Do not share this medicine with others. What if I miss a dose? If you miss a dose, take it as soon as you can. If it is almost time for your next dose, take only that dose. Do not take double or extra doses. What may interact with this medicine? -herbal or dietary supplements -local or general anesthetics -medicines for high blood pressure -medicines for prostate problems -rifampin This list may not describe all possible interactions. Give your health  care provider a list of all the medicines, herbs, non-prescription drugs, or dietary supplements you use. Also tell them if you smoke, drink alcohol, or use illegal drugs. Some items may interact with your medicine. What should I watch for while using this medicine? Visit your doctor or health care professional for regular check ups. Check your blood pressure and pulse rate regularly. Ask your health care professional what your blood pressure and pulse rate should be, and when you should contact him or her. This medicine may make you feel confused, dizzy or lightheaded. Do not drive, use machinery, or do anything that needs mental alertness until you know how this medicine affects you. To reduce the risk of dizzy or fainting spells, do not sit or stand up quickly, especially if you are an older patient. Avoid alcoholic drinks; they can make you more dizzy. Do not suddenly stop taking amlodipine. Ask your doctor or health care professional how you can gradually reduce the dose. What side effects may I notice from receiving this medicine? Side effects that you should report to your doctor or health care professional as soon as possible: -allergic reactions like skin rash, itching or hives, swelling of the face, lips, or tongue -breathing problems -changes in vision or hearing -chest pain -fast, irregular heartbeat -swelling of legs or ankles Side effects that usually do not require medical attention (report to your doctor or health care professional if they continue or are bothersome): -dry mouth -facial flushing -nausea, vomiting -stomach gas, pain -tired, weak -trouble sleeping This list may not describe all possible side  effects. Call your doctor for medical advice about side effects. You may report side effects to FDA at 1-800-FDA-1088. Where should I keep my medicine? Keep out of the reach of children. Store at room temperature between 59 and 86 degrees F (15 and 30 degrees C). Protect from  light. Keep container tightly closed. Throw away any unused medicine after the expiration date. NOTE: This sheet is a summary. It may not cover all possible information. If you have questions about this medicine, talk to your doctor, pharmacist, or health care provider.  2018 Elsevier/Gold Standard (2012-06-30 11:40:58)

## 2016-10-13 NOTE — Progress Notes (Signed)
Patient had an episode of unresponsiveness to voice command, touch and sternal rub. Rapid response paged. AP notified of change in patient's status. Order to give Narcan stat and ABG. Patient O2 sats 100. BP 193/72. CBG 134 Ap and Rapid at bedside to assess patient. Patient is tracking but unresponsive to touch or voice commands. Rapid paged Dr. Cheral Marker at bedside. Order to monitor patient and EEG to be performed in the morning. Patient is on continuous pulse ox. Sats 100 on CPAP. Patient still unresponsive to voice commands but tracks. Will continue to monitor patient.

## 2016-10-13 NOTE — Progress Notes (Signed)
Patient report given to Ubaldo Glassing, CMT/ patient care tech at [facility]. Marcille Blanco, RN

## 2016-10-13 NOTE — Discharge Summary (Signed)
Physician Discharge Summary  [name] WJX:[id number] DOB: [birth year] DOA: 10/11/2016  PCP: Kingsley Callander, FNP  Admit date: 10/11/2016 Discharge date: 10/13/2016  Recommendations for Outpatient Follow-up:  Continue Norvasc for blood pressure control  Hold lisinopril until you see PCP who can recheck kidney function and determine if creatinine stable. Creatinine is 1.62 this am.  Hold gabapentin, ativan, Desyrel, hydroxyzine. Mental status much better today prior to discharge.  Discharge Diagnoses:  Principal Problem:   Chest pain Active Problems:   Type 2 diabetes mellitus with diabetic polyneuropathy, without long-term current use of insulin (HCC)   Schizophrenia (HCC)   Essential hypertension   Chronic diastolic heart failure (HCC)   Sleep apnea   CKD (chronic kidney disease) stage 3, GFR 30-59 ml/min   Encephalopathy   Altered mental status    Discharge Condition: stable   Diet recommendation: as tolerated   History of present illness:  66 y.o. female with a past medical history significant for schizophrenia, NIDDM, OSA/OHS, hx of PE, HFpEF, and HTN who presented with chest pain and confusion.   ED course: Pt was hemodynamically stable on admission  Troponin negative CT head showed no acute intracranial findings  ECG showed no change from previous, no ST changes CODE STROKE was called, but canceled by neurology on arrival, they found no focal neurologic deficits, recommended workup for metabolic encephalopathy and chest pain.   Hospital Course:   Chest pain - Negative troponin, no acute ekg changes,  - D dimer elevated, - CT angio - no PE - No chest pain this am - Will need outpatient stress test.  Acute metabolic encephalopathy: - Possibly from polypharmacy and psych meds - Holding hydroxyzine, gabapentin and ativan - Much better mental status this am  CKD stage 3 - Baseline Cr 1.6 in 10/2015 - Cr within baseline range on this admission    Essential hypertension  - Continue amlodipine, carvedilol, furosemide - Hold lisinopril due to renal insufficiency   Non-insulin dependent diabetes mellitus with diabetic neuropathy and diabetic nephropathy  - Continue linagliptin  - Holding gabapentin due to concern that it may have exacerbated altered mental status  Schizophrenia - Continue Risperdal - Continue prazosin - Continue duloxetine  Chronic diastolic CHF - Euvolvemic on exam - Continue furosemide 20 mg daily   OHS/OSA - Continue CPAP  Hypothyroidism: - Continue levothyroxine  Morbid obesity - Body mass index is 39.35 kg/m - Counseled on diet   MRSA colonization - On contact precaution, decolonization protocol.  DVT prophylaxis:Lovenox Code Status:FULL Family Communication:No family at the bedside this am    Consults:  Neurology  Procedures:  EEG 10/13/2016 - diffuse cerebral slowing  Signed:  Leisa Lenz, MD  [facility] 10/13/2016, 10:57 AM  Pager #: [phone]  Time spent in minutes: less than 30 minutes   Discharge Exam: Vitals:   10/13/16 0508 10/13/16 0923  BP: 114/68 (!) 155/67  Pulse: 64 83  Resp: 18   Temp: 97.7 F (36.5 C) 98.2 F (36.8 C)   Vitals:   10/13/16 0054 10/13/16 0228 10/13/16 0508 10/13/16 0923  BP: 112/65 108/61 114/68 (!) 155/67  Pulse: 71 67 64 83  Resp: 18  18   Temp: 97.4 F (36.3 C)  97.7 F (36.5 C) 98.2 F (36.8 C)  TempSrc: Axillary  Oral Oral  SpO2: 100% 100% 99% 98%  Weight:   87.3 kg (192 lb 7.4 oz)   Height:        General: Pt is alert, not in acute distress  Cardiovascular: Regular rate and rhythm, S1/S2 + Respiratory: no wheezing, no crackles, no rhonchi Abdominal: Soft, non tender, non distended, bowel sounds +, no guarding Extremities: no cyanosis, pulses palpable bilaterally DP and PT Neuro: Grossly nonfocal  Discharge Instructions  Discharge Instructions    Call MD for:  persistant nausea and vomiting     Complete by:  As directed    Call MD for:  redness, tenderness, or signs of infection (pain, swelling, redness, odor or green/yellow discharge around incision site)    Complete by:  As directed    Call MD for:  severe uncontrolled pain    Complete by:  As directed    Diet - low sodium heart healthy    Complete by:  As directed    Discharge instructions    Complete by:  As directed    Continue Norvasc for blood pressure control  Hold lisinopril until you see PCP who can recheck kidney function and determine if creatinine stable. Creatinine is 1.62 this am.  Hold gabapentin, ativan, Desyrel, hydroxyzine. Mental status much better today prior to discharge.     Allergies as of 10/13/2016      Reactions   Benztropine Mesylate Other (See Comments)   REACTION: Alopecia and white spots on eyes   Codeine Other (See Comments)   REACTION: Mouth Swelling - Tachycardia   Diazepam Other (See Comments)   REACTION: COMA   Diphenhydramine Hcl Other (See Comments)   REACTION: Tachycardia , anxiousness   Penicillins Other (See Comments)   REACTION: ulcers in mouth, swelling, throat swelling  Has patient had a PCN reaction causing immediate rash, facial/tongue/throat swelling, SOB or lightheadedness with hypotension: YES Has patient had a PCN reaction causing severe rash involving Mucus membranes or skin necrosis: Unknown Has patient had a PCN reaction that required hospitalization Unknown Has patient had a PCN reaction occurring within the last 10 years: unknown If all of the above answers are "NO", then may proceed with Cephalospori   Sulfonamide Derivatives Other (See Comments)   REACTION: rash - blisters   Thioridazine Hcl Other (See Comments)   REACTION: swelling   Lisinopril Other (See Comments)   REACTION: cough      Medication List    STOP taking these medications   gabapentin 300 MG capsule Commonly known as:  NEURONTIN   hydrOXYzine 25 MG capsule Commonly known as:  VISTARIL    lisinopril 2.5 MG tablet Commonly known as:  PRINIVIL,ZESTRIL   LORazepam 1 MG tablet Commonly known as:  ATIVAN   traZODone 100 MG tablet Commonly known as:  DESYREL     TAKE these medications   acetaminophen 325 MG tablet Commonly known as:  TYLENOL Take 650 mg by mouth every 8 (eight) hours as needed for moderate pain.   albuterol (5 MG/ML) 0.5% nebulizer solution Commonly known as:  PROVENTIL Take 2.5 mg by nebulization every 6 (six) hours as needed for wheezing or shortness of breath.   albuterol 108 (90 Base) MCG/ACT inhaler Commonly known as:  PROVENTIL HFA;VENTOLIN HFA Inhale 2 puffs into the lungs every 6 (six) hours as needed for wheezing or shortness of breath.   amLODipine 10 MG tablet Commonly known as:  NORVASC Take 1 tablet (10 mg total) by mouth daily.   antiseptic oral rinse Liqd 15 mLs by Mouth Rinse route daily.   aspirin 81 MG tablet Take 81 mg by mouth daily.   atorvastatin 40 MG tablet Commonly known as:  LIPITOR Take 40 mg by mouth daily.  BABY POWDER EX Apply 1 application topically daily as needed (for rash).   budesonide-formoterol 160-4.5 MCG/ACT inhaler Commonly known as:  SYMBICORT Inhale 2 puffs into the lungs 2 (two) times daily.   carvedilol 25 MG tablet Commonly known as:  COREG Take 25 mg by mouth 2 (two) times daily with a meal.   cholecalciferol 1000 units tablet Commonly known as:  VITAMIN D Take 4,000 Units by mouth daily.   DAILY VITE PO Take 1 tablet by mouth daily.   docusate sodium 100 MG capsule Commonly known as:  COLACE Take 100 mg by mouth daily.   DULoxetine 60 MG capsule Commonly known as:  CYMBALTA Take 60 mg by mouth daily.   EPINEPHrine 0.3 mg/0.3 mL Devi Commonly known as:  EPI-PEN Inject 0.3 mg into the muscle once.   furosemide 20 MG tablet Commonly known as:  LASIX Take 1 tablet (20 mg total) by mouth daily. Start taking on:  10/14/2016 What changed:  how much to take   ipratropium 0.02 %  nebulizer solution Commonly known as:  ATROVENT Take 0.5 mg by nebulization every 6 (six) hours as needed for wheezing or shortness of breath.   levothyroxine 25 MCG tablet Commonly known as:  SYNTHROID, LEVOTHROID Take 25 mcg by mouth daily before breakfast.   linagliptin 5 MG Tabs tablet Commonly known as:  TRADJENTA Take 5 mg by mouth daily.   montelukast 10 MG tablet Commonly known as:  SINGULAIR Take 10 mg by mouth at bedtime.   NON FORMULARY 1 each by Other route See admin instructions. CPAP at bedtime. CPAP setting of 15CM of water   nystatin cream Commonly known as:  MYCOSTATIN Apply 1 application topically 2 (two) times daily.   omeprazole 20 MG capsule Commonly known as:  PRILOSEC Take 20 mg by mouth daily.   prazosin 2 MG capsule Commonly known as:  MINIPRESS Take 2 mg by mouth at bedtime.   pyridOXINE 100 MG tablet Commonly known as:  VITAMIN B-6 Take 200 mg by mouth 2 (two) times daily.   risperiDONE 1 MG tablet Commonly known as:  RISPERDAL Take 1 mg by mouth at bedtime.   spironolactone 25 MG tablet Commonly known as:  ALDACTONE Take 25 mg by mouth daily.      Follow-up Information    Kingsley Callander, FNP. Schedule an appointment as soon as possible for a visit in 1 week(s).   Specialty:  Family Medicine Contact information: [place] [address] [phone]            The results of significant diagnostics from this hospitalization (including imaging, microbiology, ancillary and laboratory) are listed below for reference.    Significant Diagnostic Studies: Dg Chest 2 View Result Date: 10/12/2016 Mild cardiomegaly.  Mild bronchitic changes. Electronically Signed   By: Elon Alas M.D.   On: 10/12/2016 02:01   Ct Head Wo Contrast Result Date: 10/11/2016 No acute intracranial pathology. Electronically Signed   By: Kathreen Devoid   On: 10/11/2016 21:25   Ct Angio Chest Pe W Or Wo Contrast Result Date: 10/12/2016 1. No  definite pulmonary embolus is noted. Suboptimal evaluation of segmental arterial branches due to artifacts from patient's large body habitus. 2. No infiltrate or pulmonary edema. No mediastinal hematoma or adenopathy. 3. Small hiatal hernia. 4. Cardiomegaly is noted. Atherosclerotic calcifications of thoracic aorta and coronary arteries. 5. Mild fatty infiltration of the liver.    Microbiology: MRSA PCR Screening     Status:  Abnormal   Collection Time: 10/12/16  3:16 AM  Result Value Ref Range Status   MRSA by PCR POSITIVE (A) NEGATIVE Final     Labs: Basic Metabolic Panel:  Recent Labs Lab 10/11/16 2020 10/11/16 2026 10/12/16 0554 10/13/16 0330  NA 136 139 140 140  K 4.4 4.4 4.1 3.8  CL 107 107 110 108  CO2 21*  --  21* 24  GLUCOSE 199* 192* 134* 154*  BUN 32* 33* 29* 24*  CREATININE 1.50* 1.50* 1.37* 1.62*  CALCIUM 9.0  --  8.6* 9.1   Liver Function Tests:  Recent Labs Lab 10/11/16 2020  AST 19  ALT 22  ALKPHOS 74  BILITOT 0.4  PROT 6.6  ALBUMIN 3.3*   No results for input(s): LIPASE, AMYLASE in the last 168 hours. No results for input(s): AMMONIA in the last 168 hours. CBC:  Recent Labs Lab 10/11/16 2020 10/11/16 2026 10/12/16 0554  WBC 9.1  --  7.6  NEUTROABS 5.8  --   --   HGB 10.7* 9.9* 10.0*  HCT 30.8* 29.0* 29.4*  MCV 91.7  --  91.6  PLT 184  --  177   Cardiac Enzymes:  Recent Labs Lab 10/12/16 0034 10/12/16 0554  TROPONINI <0.03 <0.03   BNP: BNP (last 3 results)  Recent Labs  08/12/16 0859  BNP 176.6*    ProBNP (last 3 results) No results for input(s): PROBNP in the last 8760 hours.  CBG:  Recent Labs Lab 10/12/16 1151 10/12/16 1709 10/12/16 2140 10/12/16 2257 10/13/16 0759  GLUCAP 149* 154* 111* 134* 155*

## 2016-10-13 NOTE — Progress Notes (Signed)
EEG Completed; Results Pending  

## 2016-10-14 LAB — HEMOGLOBIN A1C
Hgb A1c MFr Bld: 5.6 % (ref 4.8–5.6)
Mean Plasma Glucose: 114 mg/dL

## 2016-10-20 ENCOUNTER — Encounter: Payer: Self-pay | Admitting: Podiatry

## 2016-10-20 ENCOUNTER — Ambulatory Visit (INDEPENDENT_AMBULATORY_CARE_PROVIDER_SITE_OTHER): Payer: Medicaid Other | Admitting: Podiatry

## 2016-10-20 VITALS — BP 178/83 | HR 65 | Resp 18

## 2016-10-20 DIAGNOSIS — E1142 Type 2 diabetes mellitus with diabetic polyneuropathy: Secondary | ICD-10-CM

## 2016-10-20 DIAGNOSIS — L97409 Non-pressure chronic ulcer of unspecified heel and midfoot with unspecified severity: Secondary | ICD-10-CM | POA: Diagnosis not present

## 2016-10-20 DIAGNOSIS — L98491 Non-pressure chronic ulcer of skin of other sites limited to breakdown of skin: Secondary | ICD-10-CM

## 2016-10-20 NOTE — Patient Instructions (Signed)
Slight topical antibiotic ointment such as triple antibiotic ointment to the skin ulcer daily on the bottom of the right foot, base of the toe Attached foam pad around the skin ulcer Cover with gauze Attach with Coflex tape Wear the surgical shoe on the right foot and limit standing and walking If you notice any sudden increase in pain, swelling, redness, fever present to the emergency department  [place] 10/20/2016       Diabetes and Foot Care Diabetes may cause you to have problems because of poor blood supply (circulation) to your feet and legs. This may cause the skin on your feet to become thinner, break easier, and heal more slowly. Your skin may become dry, and the skin may peel and crack. You may also have nerve damage in your legs and feet causing decreased feeling in them. You may not notice minor injuries to your feet that could lead to infections or more serious problems. Taking care of your feet is one of the most important things you can do for yourself. Follow these instructions at home:  Wear shoes at all times, even in the house. Do not go barefoot. Bare feet are easily injured.  Check your feet daily for blisters, cuts, and redness. If you cannot see the bottom of your feet, use a mirror or ask someone for help.  Wash your feet with warm water (do not use hot water) and mild soap. Then pat your feet and the areas between your toes until they are completely dry. Do not soak your feet as this can dry your skin.  Apply a moisturizing lotion or petroleum jelly (that does not contain alcohol and is unscented) to the skin on your feet and to dry, brittle toenails. Do not apply lotion between your toes.  Trim your toenails straight across. Do not dig under them or around the cuticle. File the edges of your nails with an emery board or nail file.  Do not cut corns or calluses or try to remove them with medicine.  Wear clean socks or stockings  every day. Make sure they are not too tight. Do not wear knee-high stockings since they may decrease blood flow to your legs.  Wear shoes that fit properly and have enough cushioning. To break in new shoes, wear them for just a few hours a day. This prevents you from injuring your feet. Always look in your shoes before you put them on to be sure there are no objects inside.  Do not cross your legs. This may decrease the blood flow to your feet.  If you find a minor scrape, cut, or break in the skin on your feet, keep it and the skin around it clean and dry. These areas may be cleansed with mild soap and water. Do not cleanse the area with peroxide, alcohol, or iodine.  When you remove an adhesive bandage, be sure not to damage the skin around it.  If you have a wound, look at it several times a day to make sure it is healing.  Do not use heating pads or hot water bottles. They may burn your skin. If you have lost feeling in your feet or legs, you may not know it is happening until it is too late.  Make sure your health care provider performs a complete foot exam at least annually or more often if you have foot problems. Report any cuts, sores, or bruises to your health care provider immediately.  Contact a health care provider if:  You have an injury that is not healing.  You have cuts or breaks in the skin.  You have an ingrown nail.  You notice redness on your legs or feet.  You feel burning or tingling in your legs or feet.  You have pain or cramps in your legs and feet.  Your legs or feet are numb.  Your feet always feel cold. Get help right away if:  There is increasing redness, swelling, or pain in or around a wound.  There is a red line that goes up your leg.  Pus is coming from a wound.  You develop a fever or as directed by your health care provider.  You notice a bad smell coming from an ulcer or wound. This information is not intended to replace advice given to  you by your health care provider. Make sure you discuss any questions you have with your health care provider. Document Released: 07/30/2000 Document Revised: 01/08/2016 Document Reviewed: 01/09/2013 Elsevier Interactive Patient Education  2017 [employer].

## 2016-10-20 NOTE — Progress Notes (Signed)
Patient ID: [name], female   DOB: May 10, 1951, 66 y.o.   MRN: [id number]   Subjective:  This patient presents for follow-up care for a superficial diabetic skin ulcer on the plantar aspect of the right foot. Patient was last evaluated on 07/14/2016 and instructed to return 2 weeks from that date. Patient states she was in the hospital when unable to present to office for follow-up care Also, patient states that she would like to have diabetic shoes for history of diabetes and ulceration Patient is a known diabetic with neuropathy. Patient lives in  facility and representative of  the facility is present in the treatment room today.  Objective:  Patient appears to be able to answer questions DP pulses 2/4 bilaterally PT pulses 2/4 bilaterally Capillary reflex. Within normal limits bilaterally No peripheral edema bilaterally Sensation to 10 g monofilament wire intact 0/5 bilaterally bilaterally Vibratory sensation nonreactive bilaterally Ankle reflexes reactive bilaterally Superficial 5x2 mm  ulcer surrounded by hyperkeratotic tissue plantar right first MPJ. 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 Mallet toe third left Limited range of motion subtalar midtarsal joints bilaterally Patient has unstable gait    Assessment:  Noninfected superficial ulcer plantar first MPJ right Diabetic peripheral neuropathy  Plan: Debrided ulcer and attach protective foam pad and cover wound with topical antibiotic ointment Surgical shoe dispensed to wear and right foot Patient instructed in the after visit summary to apply topical antibiotic ointment such as triple antibiotic ointment to the skin ulcer in the plantar aspect of right foot, attached foam around a wound cover with gauze and attach with Coflex tape Wear the surgical shoe on the right foot  Impression obtained today for diabetic shoes  for the indication of diabetic neuropathy with loss of vibratory and protective sensation, and plantar ulcer right  Reappoint 2 weeks for follow-up care for wound care. Patient instructed if she noticed any sudden increase of swelling, pain, fever, drainage, warmth present to the emergency department

## 2016-11-02 DIAGNOSIS — E7800 Pure hypercholesterolemia, unspecified: Secondary | ICD-10-CM | POA: Diagnosis not present

## 2016-11-02 DIAGNOSIS — F329 Major depressive disorder, single episode, unspecified: Secondary | ICD-10-CM | POA: Diagnosis not present

## 2016-11-02 DIAGNOSIS — Z79899 Other long term (current) drug therapy: Secondary | ICD-10-CM | POA: Diagnosis not present

## 2016-11-02 DIAGNOSIS — Z8614 Personal history of Methicillin resistant Staphylococcus aureus infection: Secondary | ICD-10-CM | POA: Diagnosis not present

## 2016-11-02 DIAGNOSIS — E119 Type 2 diabetes mellitus without complications: Secondary | ICD-10-CM | POA: Diagnosis not present

## 2016-11-02 DIAGNOSIS — Z7984 Long term (current) use of oral hypoglycemic drugs: Secondary | ICD-10-CM | POA: Diagnosis not present

## 2016-11-02 DIAGNOSIS — I509 Heart failure, unspecified: Secondary | ICD-10-CM | POA: Diagnosis not present

## 2016-11-02 DIAGNOSIS — L720 Epidermal cyst: Secondary | ICD-10-CM | POA: Diagnosis not present

## 2016-11-02 DIAGNOSIS — E039 Hypothyroidism, unspecified: Secondary | ICD-10-CM | POA: Diagnosis not present

## 2016-11-03 ENCOUNTER — Ambulatory Visit: Payer: Medicaid Other | Admitting: Podiatry

## 2016-11-03 DIAGNOSIS — Z4800 Encounter for change or removal of nonsurgical wound dressing: Secondary | ICD-10-CM | POA: Diagnosis not present

## 2016-11-03 DIAGNOSIS — N611 Abscess of the breast and nipple: Secondary | ICD-10-CM | POA: Diagnosis not present

## 2016-11-07 DIAGNOSIS — R030 Elevated blood-pressure reading, without diagnosis of hypertension: Secondary | ICD-10-CM | POA: Diagnosis not present

## 2016-11-07 DIAGNOSIS — Z79899 Other long term (current) drug therapy: Secondary | ICD-10-CM | POA: Diagnosis not present

## 2016-11-07 DIAGNOSIS — R51 Headache: Secondary | ICD-10-CM | POA: Diagnosis not present

## 2016-11-07 DIAGNOSIS — F329 Major depressive disorder, single episode, unspecified: Secondary | ICD-10-CM | POA: Diagnosis not present

## 2016-11-07 DIAGNOSIS — G4489 Other headache syndrome: Secondary | ICD-10-CM | POA: Diagnosis not present

## 2016-11-07 DIAGNOSIS — E039 Hypothyroidism, unspecified: Secondary | ICD-10-CM | POA: Diagnosis not present

## 2016-11-07 DIAGNOSIS — Z4800 Encounter for change or removal of nonsurgical wound dressing: Secondary | ICD-10-CM | POA: Diagnosis not present

## 2016-11-07 DIAGNOSIS — E119 Type 2 diabetes mellitus without complications: Secondary | ICD-10-CM | POA: Diagnosis not present

## 2016-11-07 DIAGNOSIS — Z8614 Personal history of Methicillin resistant Staphylococcus aureus infection: Secondary | ICD-10-CM | POA: Diagnosis not present

## 2016-11-07 DIAGNOSIS — E7800 Pure hypercholesterolemia, unspecified: Secondary | ICD-10-CM | POA: Diagnosis not present

## 2016-11-07 DIAGNOSIS — N611 Abscess of the breast and nipple: Secondary | ICD-10-CM | POA: Diagnosis not present

## 2016-11-07 DIAGNOSIS — I509 Heart failure, unspecified: Secondary | ICD-10-CM | POA: Diagnosis not present

## 2016-11-23 DIAGNOSIS — K625 Hemorrhage of anus and rectum: Secondary | ICD-10-CM | POA: Diagnosis not present

## 2016-11-23 DIAGNOSIS — Z8614 Personal history of Methicillin resistant Staphylococcus aureus infection: Secondary | ICD-10-CM | POA: Diagnosis not present

## 2016-11-23 DIAGNOSIS — Z79899 Other long term (current) drug therapy: Secondary | ICD-10-CM | POA: Diagnosis not present

## 2016-11-23 DIAGNOSIS — F329 Major depressive disorder, single episode, unspecified: Secondary | ICD-10-CM | POA: Diagnosis not present

## 2016-11-23 DIAGNOSIS — E119 Type 2 diabetes mellitus without complications: Secondary | ICD-10-CM | POA: Diagnosis not present

## 2016-11-23 DIAGNOSIS — I509 Heart failure, unspecified: Secondary | ICD-10-CM | POA: Diagnosis not present

## 2016-11-23 DIAGNOSIS — E039 Hypothyroidism, unspecified: Secondary | ICD-10-CM | POA: Diagnosis not present

## 2016-11-23 DIAGNOSIS — K5900 Constipation, unspecified: Secondary | ICD-10-CM | POA: Diagnosis not present

## 2016-11-23 DIAGNOSIS — I10 Essential (primary) hypertension: Secondary | ICD-10-CM | POA: Diagnosis not present

## 2016-11-23 DIAGNOSIS — E7800 Pure hypercholesterolemia, unspecified: Secondary | ICD-10-CM | POA: Diagnosis not present

## 2016-11-24 ENCOUNTER — Ambulatory Visit: Payer: Medicaid Other | Admitting: Podiatry

## 2016-11-29 DIAGNOSIS — I10 Essential (primary) hypertension: Secondary | ICD-10-CM | POA: Diagnosis not present

## 2016-11-29 DIAGNOSIS — L299 Pruritus, unspecified: Secondary | ICD-10-CM | POA: Diagnosis not present

## 2016-11-29 DIAGNOSIS — F418 Other specified anxiety disorders: Secondary | ICD-10-CM | POA: Diagnosis not present

## 2016-11-29 DIAGNOSIS — G629 Polyneuropathy, unspecified: Secondary | ICD-10-CM | POA: Diagnosis not present

## 2016-12-08 ENCOUNTER — Ambulatory Visit (INDEPENDENT_AMBULATORY_CARE_PROVIDER_SITE_OTHER): Payer: Medicare Other | Admitting: Podiatry

## 2016-12-08 DIAGNOSIS — E1140 Type 2 diabetes mellitus with diabetic neuropathy, unspecified: Secondary | ICD-10-CM | POA: Diagnosis not present

## 2016-12-08 DIAGNOSIS — Q828 Other specified congenital malformations of skin: Secondary | ICD-10-CM | POA: Diagnosis not present

## 2016-12-08 DIAGNOSIS — E1149 Type 2 diabetes mellitus with other diabetic neurological complication: Secondary | ICD-10-CM | POA: Diagnosis not present

## 2016-12-08 NOTE — Progress Notes (Signed)
Subjective:    Patient ID: [name], female   DOB: 66 y.o.   MRN: [id number]   HPI patient presents with painful lesion sub-first metatarsal right and long-term diabetes with chronic neuropathic like symptoms and history of ulceration    ROS      Objective:  Physical Exam No change in neurovascular with diminishment noted both of pulses and neurological sensation with patient found to have lesion sub-first metatarsal right that has a center that has lucent core but no indications currently of drainage    Assessment:    Long-term diabetic who has neurological neuropathy and moderate vascular disease was sub-first metatarsal lesion right that's had history of ulceration     Plan:    [name] condition discussed and diabetic foot care discussed with patient. Today using sharp sterile his mentation I debrided the porokeratotic type lesion and saw no drainage and advised long-term on diabetic shoes to try to prevent pressure against the first metatarsal and hopefully keep her from ulcerating. We will get approval for this

## 2016-12-14 ENCOUNTER — Telehealth: Payer: Self-pay | Admitting: *Deleted

## 2016-12-14 DIAGNOSIS — G629 Polyneuropathy, unspecified: Secondary | ICD-10-CM | POA: Diagnosis not present

## 2016-12-14 DIAGNOSIS — R21 Rash and other nonspecific skin eruption: Secondary | ICD-10-CM | POA: Diagnosis not present

## 2016-12-14 DIAGNOSIS — I10 Essential (primary) hypertension: Secondary | ICD-10-CM | POA: Diagnosis not present

## 2016-12-14 NOTE — Telephone Encounter (Signed)
Tried to call patient in regards to diabetic shoe issues phone number is a fax machine.  We can not submit paperwork to a FNP. Patient must schedule an appointment with an MD to be evaluated per medicare guidelines.

## 2016-12-21 DIAGNOSIS — F29 Unspecified psychosis not due to a substance or known physiological condition: Secondary | ICD-10-CM | POA: Diagnosis not present

## 2016-12-28 DIAGNOSIS — E11319 Type 2 diabetes mellitus with unspecified diabetic retinopathy without macular edema: Secondary | ICD-10-CM | POA: Diagnosis not present

## 2016-12-28 DIAGNOSIS — S90414A Abrasion, right lesser toe(s), initial encounter: Secondary | ICD-10-CM | POA: Diagnosis not present

## 2016-12-28 DIAGNOSIS — I10 Essential (primary) hypertension: Secondary | ICD-10-CM | POA: Diagnosis not present

## 2016-12-28 DIAGNOSIS — R21 Rash and other nonspecific skin eruption: Secondary | ICD-10-CM | POA: Diagnosis not present

## 2016-12-30 DIAGNOSIS — M79672 Pain in left foot: Secondary | ICD-10-CM | POA: Diagnosis not present

## 2016-12-30 DIAGNOSIS — M79671 Pain in right foot: Secondary | ICD-10-CM | POA: Diagnosis not present

## 2017-01-03 DIAGNOSIS — S81802A Unspecified open wound, left lower leg, initial encounter: Secondary | ICD-10-CM | POA: Diagnosis not present

## 2017-01-11 DIAGNOSIS — E039 Hypothyroidism, unspecified: Secondary | ICD-10-CM | POA: Diagnosis not present

## 2017-01-11 DIAGNOSIS — F329 Major depressive disorder, single episode, unspecified: Secondary | ICD-10-CM | POA: Diagnosis not present

## 2017-01-11 DIAGNOSIS — E7800 Pure hypercholesterolemia, unspecified: Secondary | ICD-10-CM | POA: Diagnosis not present

## 2017-01-11 DIAGNOSIS — L03113 Cellulitis of right upper limb: Secondary | ICD-10-CM | POA: Diagnosis not present

## 2017-01-11 DIAGNOSIS — Z7982 Long term (current) use of aspirin: Secondary | ICD-10-CM | POA: Diagnosis not present

## 2017-01-11 DIAGNOSIS — L988 Other specified disorders of the skin and subcutaneous tissue: Secondary | ICD-10-CM | POA: Diagnosis not present

## 2017-01-11 DIAGNOSIS — L0390 Cellulitis, unspecified: Secondary | ICD-10-CM | POA: Diagnosis not present

## 2017-01-11 DIAGNOSIS — L03115 Cellulitis of right lower limb: Secondary | ICD-10-CM | POA: Diagnosis not present

## 2017-01-11 DIAGNOSIS — I509 Heart failure, unspecified: Secondary | ICD-10-CM | POA: Diagnosis not present

## 2017-01-11 DIAGNOSIS — Z8614 Personal history of Methicillin resistant Staphylococcus aureus infection: Secondary | ICD-10-CM | POA: Diagnosis not present

## 2017-01-11 DIAGNOSIS — E119 Type 2 diabetes mellitus without complications: Secondary | ICD-10-CM | POA: Diagnosis not present

## 2017-01-11 DIAGNOSIS — L03116 Cellulitis of left lower limb: Secondary | ICD-10-CM | POA: Diagnosis not present

## 2017-01-11 DIAGNOSIS — Z79899 Other long term (current) drug therapy: Secondary | ICD-10-CM | POA: Diagnosis not present

## 2017-01-11 DIAGNOSIS — R238 Other skin changes: Secondary | ICD-10-CM | POA: Diagnosis not present

## 2017-01-17 DIAGNOSIS — Z79899 Other long term (current) drug therapy: Secondary | ICD-10-CM | POA: Diagnosis not present

## 2017-01-17 DIAGNOSIS — R809 Proteinuria, unspecified: Secondary | ICD-10-CM | POA: Diagnosis not present

## 2017-01-17 DIAGNOSIS — N183 Chronic kidney disease, stage 3 (moderate): Secondary | ICD-10-CM | POA: Diagnosis not present

## 2017-01-17 DIAGNOSIS — I129 Hypertensive chronic kidney disease with stage 1 through stage 4 chronic kidney disease, or unspecified chronic kidney disease: Secondary | ICD-10-CM | POA: Diagnosis not present

## 2017-01-17 DIAGNOSIS — E559 Vitamin D deficiency, unspecified: Secondary | ICD-10-CM | POA: Diagnosis not present

## 2017-01-17 DIAGNOSIS — D509 Iron deficiency anemia, unspecified: Secondary | ICD-10-CM | POA: Diagnosis not present

## 2017-01-21 DIAGNOSIS — R51 Headache: Secondary | ICD-10-CM | POA: Diagnosis not present

## 2017-01-21 DIAGNOSIS — N390 Urinary tract infection, site not specified: Secondary | ICD-10-CM | POA: Diagnosis not present

## 2017-01-21 DIAGNOSIS — F209 Schizophrenia, unspecified: Secondary | ICD-10-CM | POA: Diagnosis not present

## 2017-01-21 DIAGNOSIS — Z8614 Personal history of Methicillin resistant Staphylococcus aureus infection: Secondary | ICD-10-CM | POA: Diagnosis not present

## 2017-01-21 DIAGNOSIS — Z7982 Long term (current) use of aspirin: Secondary | ICD-10-CM | POA: Diagnosis not present

## 2017-01-21 DIAGNOSIS — E039 Hypothyroidism, unspecified: Secondary | ICD-10-CM | POA: Diagnosis not present

## 2017-01-21 DIAGNOSIS — I10 Essential (primary) hypertension: Secondary | ICD-10-CM | POA: Diagnosis not present

## 2017-01-21 DIAGNOSIS — F329 Major depressive disorder, single episode, unspecified: Secondary | ICD-10-CM | POA: Diagnosis not present

## 2017-01-21 DIAGNOSIS — I509 Heart failure, unspecified: Secondary | ICD-10-CM | POA: Diagnosis not present

## 2017-01-21 DIAGNOSIS — Z79899 Other long term (current) drug therapy: Secondary | ICD-10-CM | POA: Diagnosis not present

## 2017-01-21 DIAGNOSIS — E7800 Pure hypercholesterolemia, unspecified: Secondary | ICD-10-CM | POA: Diagnosis not present

## 2017-01-21 DIAGNOSIS — R404 Transient alteration of awareness: Secondary | ICD-10-CM | POA: Diagnosis not present

## 2017-01-21 DIAGNOSIS — E119 Type 2 diabetes mellitus without complications: Secondary | ICD-10-CM | POA: Diagnosis not present

## 2017-01-21 DIAGNOSIS — R4182 Altered mental status, unspecified: Secondary | ICD-10-CM | POA: Diagnosis not present

## 2017-01-21 DIAGNOSIS — R402421 Glasgow coma scale score 9-12, in the field [EMT or ambulance]: Secondary | ICD-10-CM | POA: Diagnosis not present

## 2017-01-22 DIAGNOSIS — F209 Schizophrenia, unspecified: Secondary | ICD-10-CM | POA: Diagnosis not present

## 2017-01-22 DIAGNOSIS — R51 Headache: Secondary | ICD-10-CM | POA: Diagnosis not present

## 2017-01-25 IMAGING — NM NM PULMONARY VENT & PERF
16 series · 16 of 16 positions shown · non-contrast
Comparison: Chest x-ray 08/12/2016

CLINICAL DATA: Leg pain.  Prior pulmonary embolus 1909

EXAM:
NUCLEAR MEDICINE VENTILATION - PERFUSION LUNG SCAN
TECHNIQUE: Ventilation images were obtained in multiple projections using
inhaled aerosol Ec-33m DTPA. Perfusion images were obtained in
multiple projections after intravenous injection of Ec-33m MAA.
RADIOPHARMACEUTICALS:  32.0 mCi 4echnetium-CCm DTPA aerosol
inhalation and 4.2 mCi 4echnetium-CCm MAA IV

[Series 1: ant/post vent · 4.14mm/px · 1 of 1 slices shown (1 of 2)]
[im 1/1]
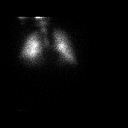

[Series 1: ant/post vent · 4.14mm/px · 1 of 1 slices shown (2 of 2)]
[im 1/1]
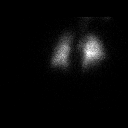

[Series 2: lao/rpo vent · 4.14mm/px · 1 of 1 slices shown (1 of 2)]
[im 1/1]
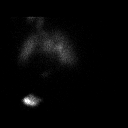

[Series 2: lao/rpo vent · 4.14mm/px · 1 of 1 slices shown (2 of 2)]
[im 1/1]
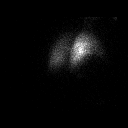

[Series 3: lpo/rao vent · 4.14mm/px · 1 of 1 slices shown (1 of 2)]
[im 1/1]
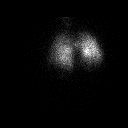

[Series 3: lpo/rao vent · 4.14mm/px · 1 of 1 slices shown (2 of 2)]
[im 1/1]
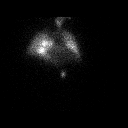

[Series 4: lt lat/rt lat vent · 4.14mm/px · 1 of 1 slices shown (1 of 2)]
[im 1/1]
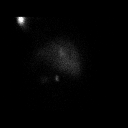

[Series 4: lt lat/rt lat vent · 4.14mm/px · 1 of 1 slices shown (2 of 2)]
[im 1/1]
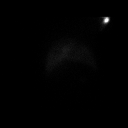

[Series 5: lt lat/rt lat perf · 4.14mm/px · 1 of 1 slices shown (1 of 2)]
[im 1/1]
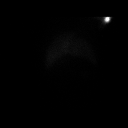

[Series 5: lt lat/rt lat perf · 4.14mm/px · 1 of 1 slices shown (2 of 2)]
[im 1/1]
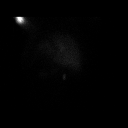

[Series 6: lpo/rao perf · 4.14mm/px · 1 of 1 slices shown (1 of 2)]
[im 1/1]
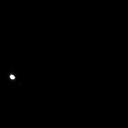

[Series 6: lpo/rao perf · 4.14mm/px · 1 of 1 slices shown (2 of 2)]
[im 1/1]
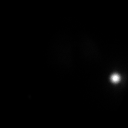

[Series 7: ant/post perf · 4.14mm/px · 1 of 1 slices shown (1 of 2)]
[im 1/1]
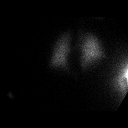

[Series 7: ant/post perf · 4.14mm/px · 1 of 1 slices shown (2 of 2)]
[im 1/1]
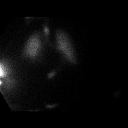

[Series 8: lao/rpo perf · 4.14mm/px · 1 of 1 slices shown (1 of 2)]
[im 1/1]
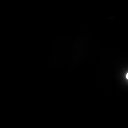

[Series 8: lao/rpo perf · 4.14mm/px · 1 of 1 slices shown (2 of 2)]
[im 1/1]
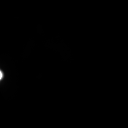

[16 of 16 positions shown; findings below may reference images not displayed]

FINDINGS: Ventilation: No focal ventilation defect.

Perfusion: No wedge shaped peripheral perfusion defects to suggest
acute pulmonary embolism.
IMPRESSION: No evidence of pulmonary embolus.

## 2017-01-25 IMAGING — CR DG CHEST 2V
2 series · 2 of 2 positions shown · non-contrast
Comparison: Radiograph January 07, 2016.

CLINICAL DATA: Shortness of breath.

EXAM:
CHEST  2 VIEW

[chest pa]
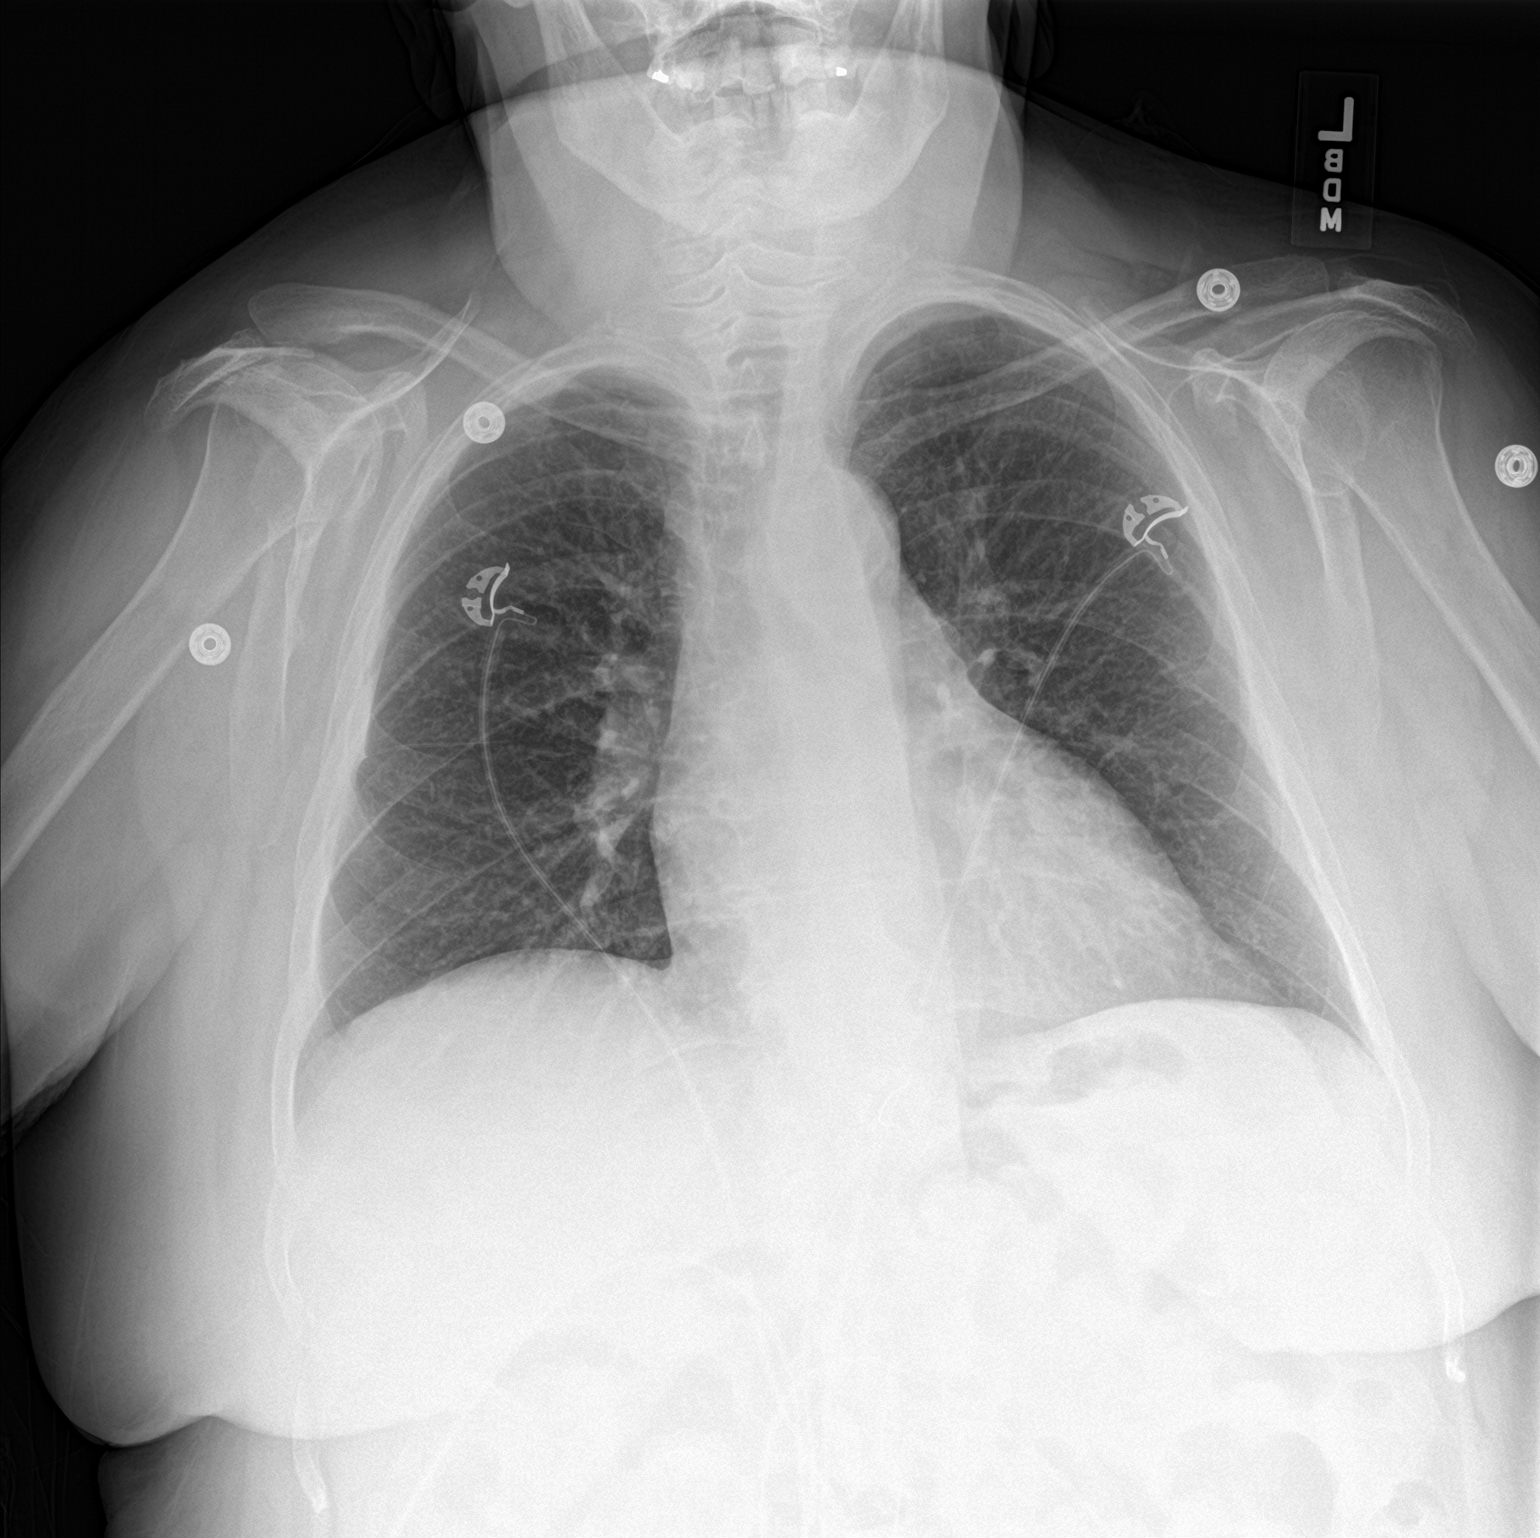

[chest lat]
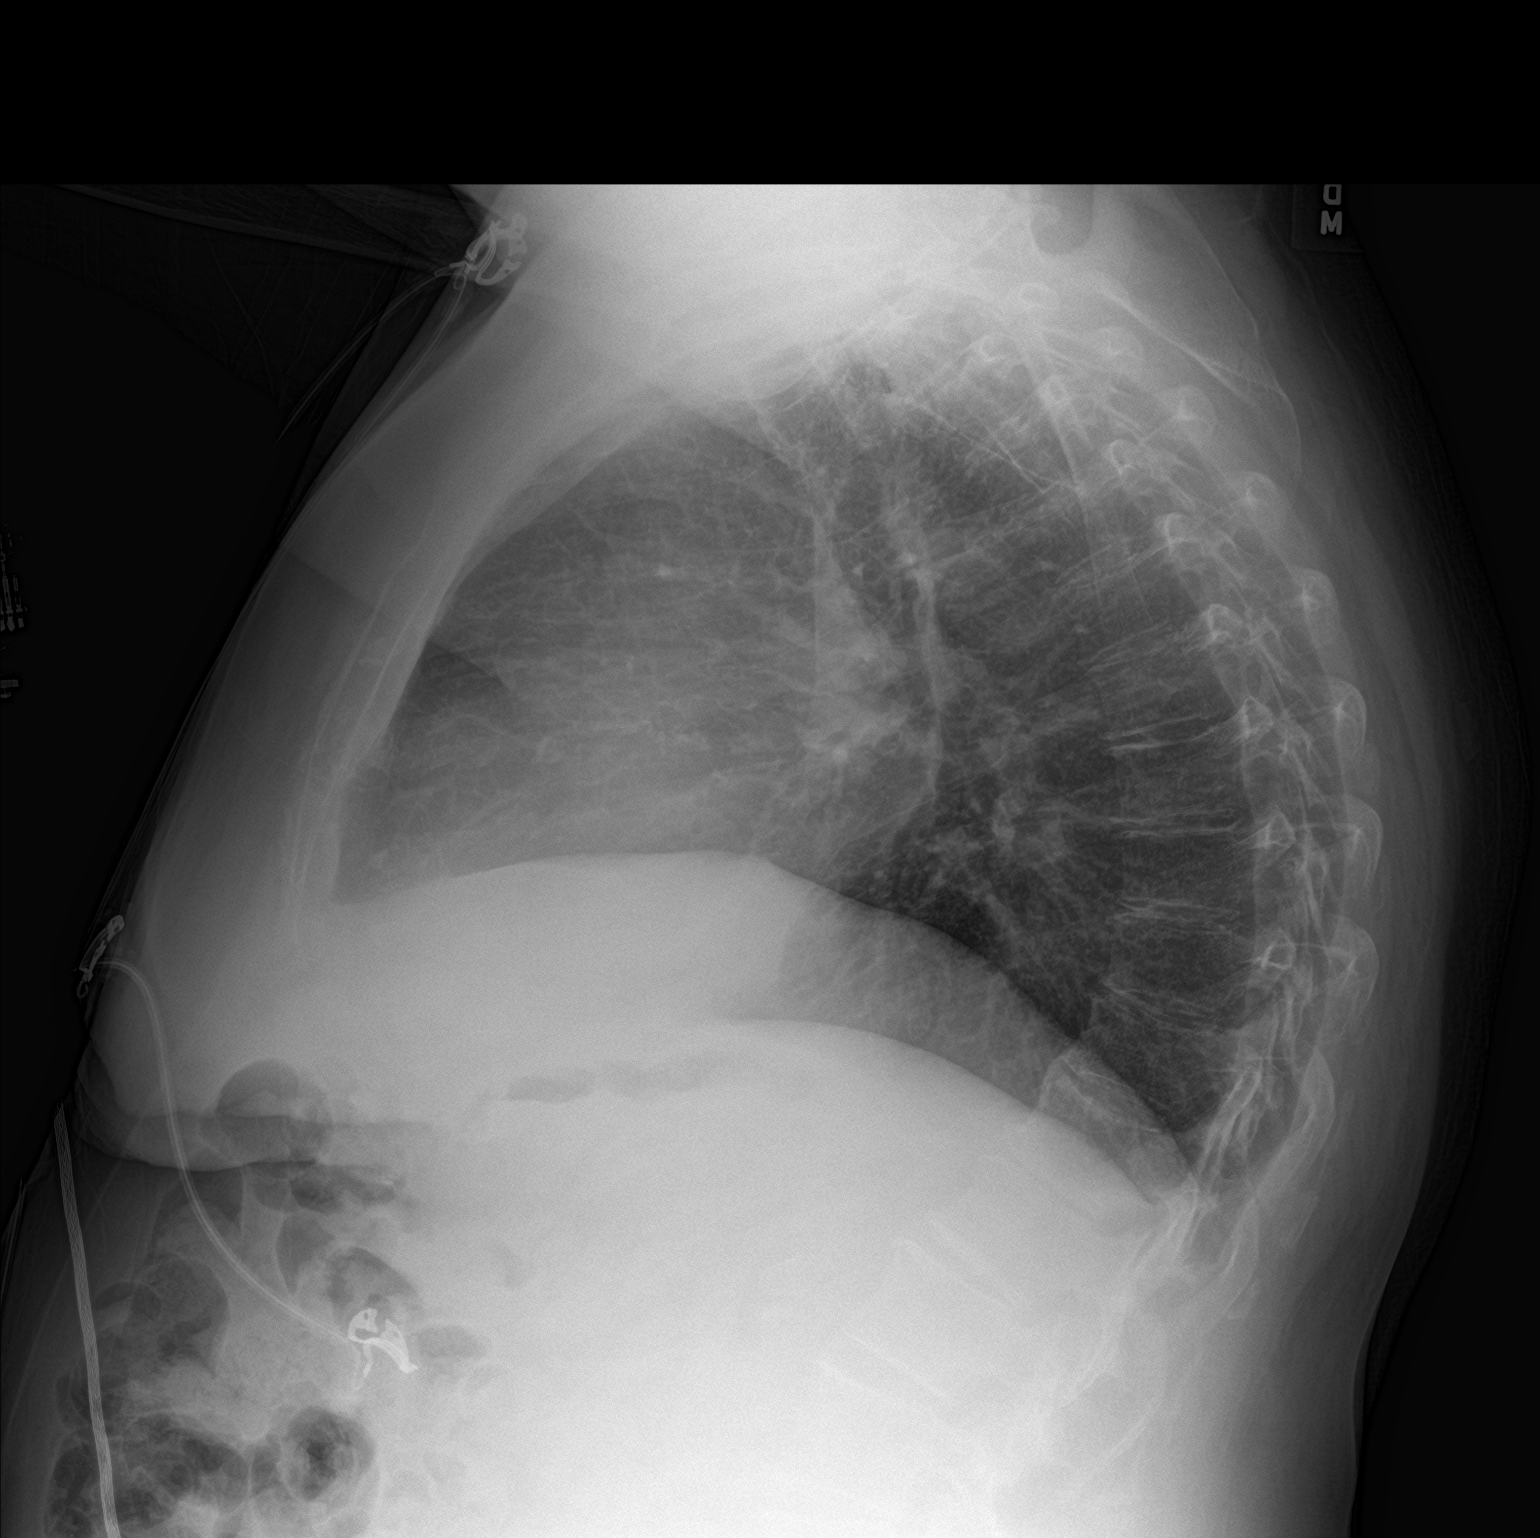

[2 of 2 positions shown; findings below may reference images not displayed]

FINDINGS: The heart size and mediastinal contours are within normal limits.
Both lungs are clear. No pneumothorax or pleural effusion is noted.
The visualized skeletal structures are unremarkable.
IMPRESSION: No active cardiopulmonary disease.

## 2017-02-15 DIAGNOSIS — H35033 Hypertensive retinopathy, bilateral: Secondary | ICD-10-CM | POA: Diagnosis not present

## 2017-02-15 DIAGNOSIS — H35372 Puckering of macula, left eye: Secondary | ICD-10-CM | POA: Diagnosis not present

## 2017-02-15 DIAGNOSIS — H25013 Cortical age-related cataract, bilateral: Secondary | ICD-10-CM | POA: Diagnosis not present

## 2017-02-15 DIAGNOSIS — H2513 Age-related nuclear cataract, bilateral: Secondary | ICD-10-CM | POA: Diagnosis not present

## 2017-02-15 DIAGNOSIS — H2512 Age-related nuclear cataract, left eye: Secondary | ICD-10-CM | POA: Diagnosis not present

## 2017-02-15 DIAGNOSIS — H25012 Cortical age-related cataract, left eye: Secondary | ICD-10-CM | POA: Diagnosis not present

## 2017-02-15 DIAGNOSIS — E119 Type 2 diabetes mellitus without complications: Secondary | ICD-10-CM | POA: Diagnosis not present

## 2017-03-08 DIAGNOSIS — S81802A Unspecified open wound, left lower leg, initial encounter: Secondary | ICD-10-CM | POA: Diagnosis not present

## 2017-03-22 DIAGNOSIS — N183 Chronic kidney disease, stage 3 (moderate): Secondary | ICD-10-CM | POA: Diagnosis not present

## 2017-03-22 DIAGNOSIS — E039 Hypothyroidism, unspecified: Secondary | ICD-10-CM | POA: Diagnosis not present

## 2017-03-22 DIAGNOSIS — E119 Type 2 diabetes mellitus without complications: Secondary | ICD-10-CM | POA: Diagnosis not present

## 2017-03-22 DIAGNOSIS — I10 Essential (primary) hypertension: Secondary | ICD-10-CM | POA: Diagnosis not present

## 2017-03-26 IMAGING — CT CT HEAD W/O CM
3 series · 15 of 47 positions shown, 18 images · non-contrast
Comparison: MR brain 11/11/2015, CT brain 11/10/2015

CLINICAL DATA: Sudden onset chest pain, dizziness, altered level
consciousness

EXAM:
CT HEAD WITHOUT CONTRAST
TECHNIQUE: Contiguous axial images were obtained from the base of the skull
through the vertex without intravenous contrast.

[Series 2: head 5.0 h30s · axial · 0.41mm/px · z∈[-232,-97]mm · 9 of 33 slices shown, 12 images]
[im 3/33  brain]
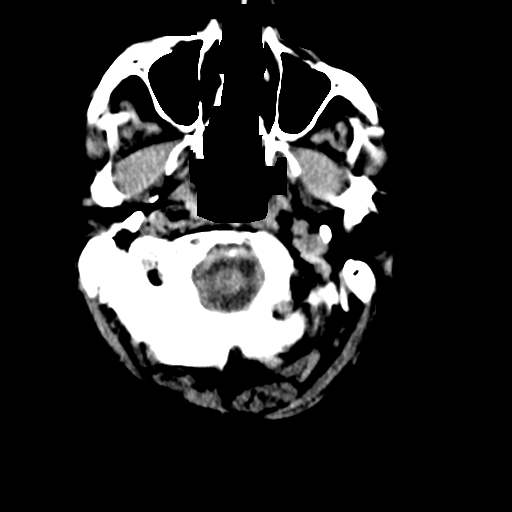
[im 3/33  bone]
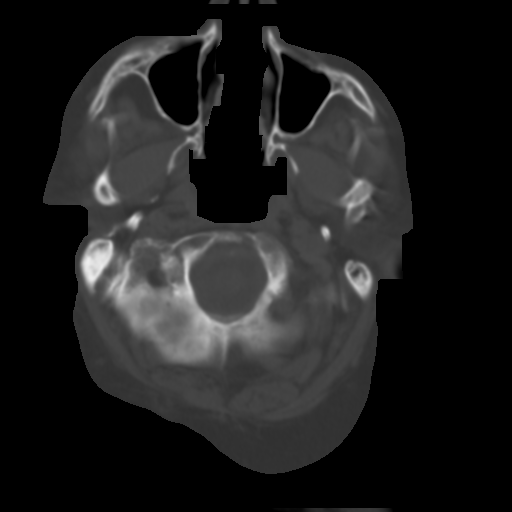
[im 6/33  brain]
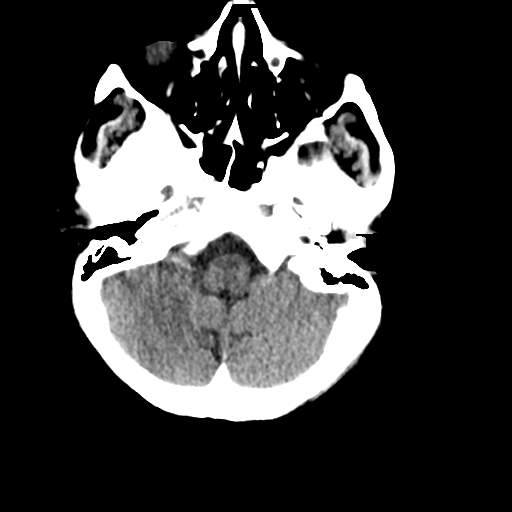
[im 9/33  brain]
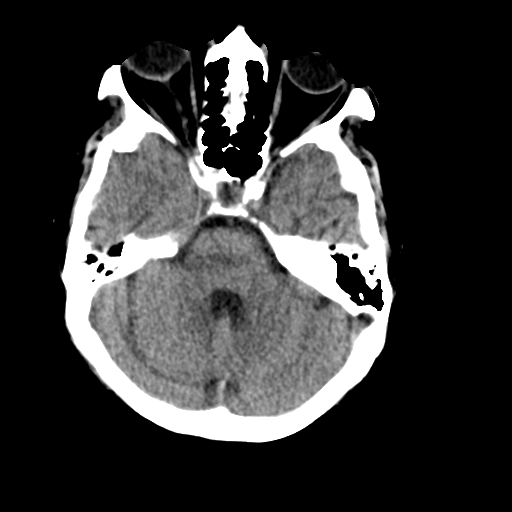
[im 13/33  brain]
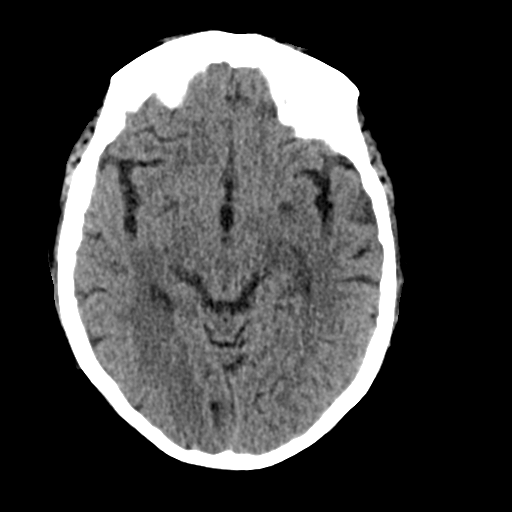
[im 17/33  brain]
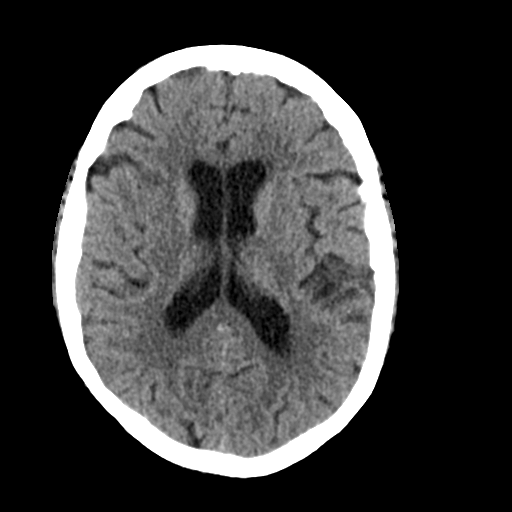
[im 17/33  bone]
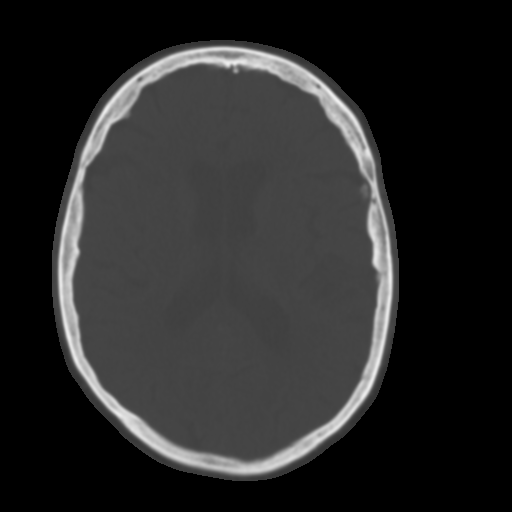
[im 20/33  brain]
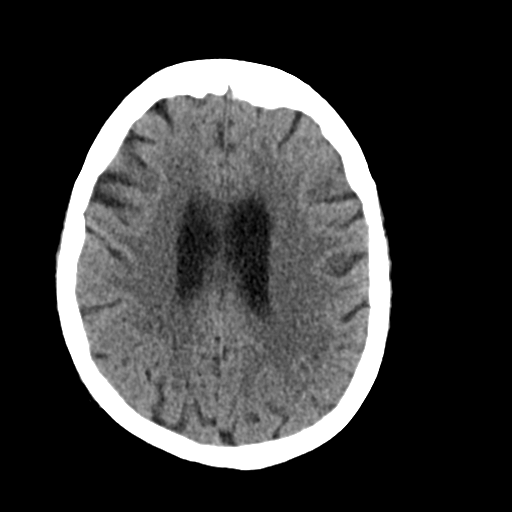
[im 24/33  brain]
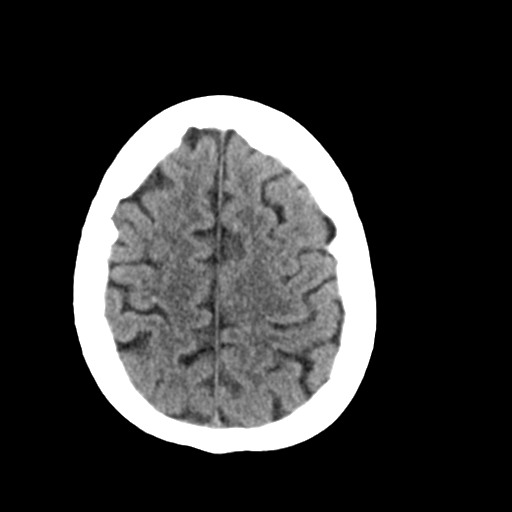
[im 27/33  brain]
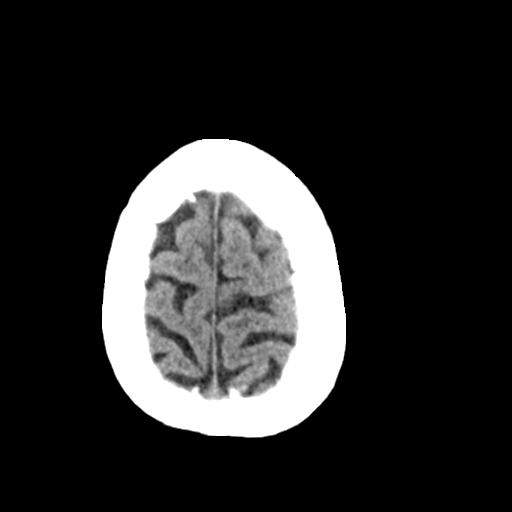
[im 30/33  brain]
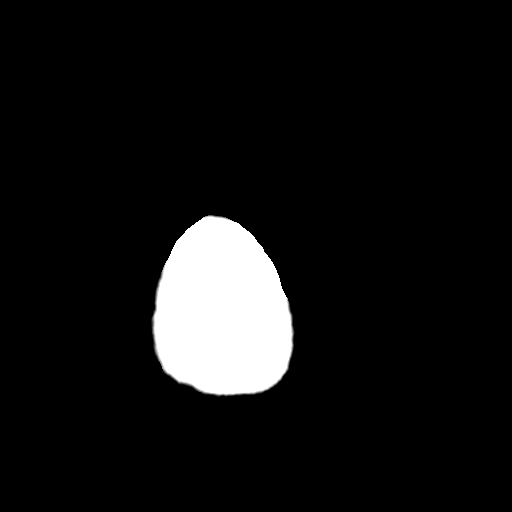
[im 30/33  bone]
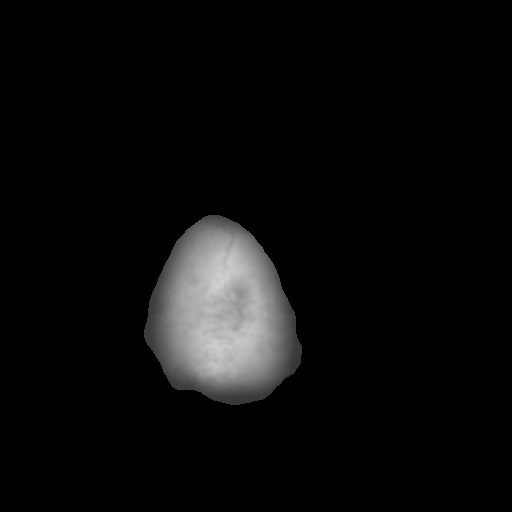

[Series 4: head 3.0 mpr cor · coronal · 0.30mm/px · 3 of 67 slices shown]
[im 23/67  brain]
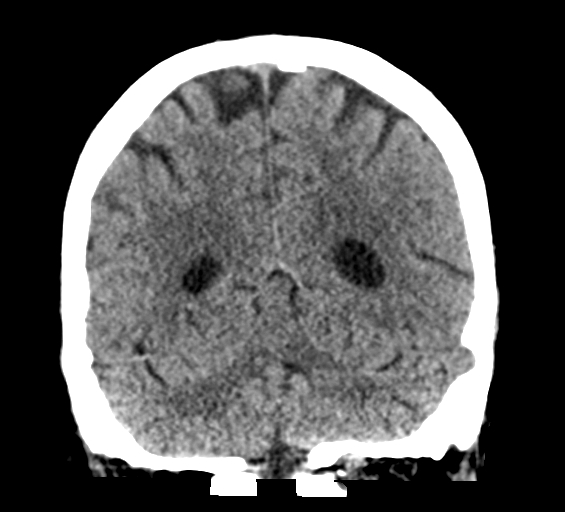
[im 30/67  brain]
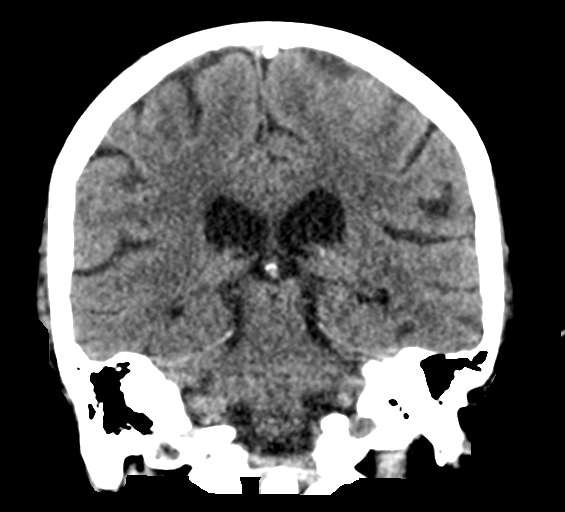
[im 37/67  brain]
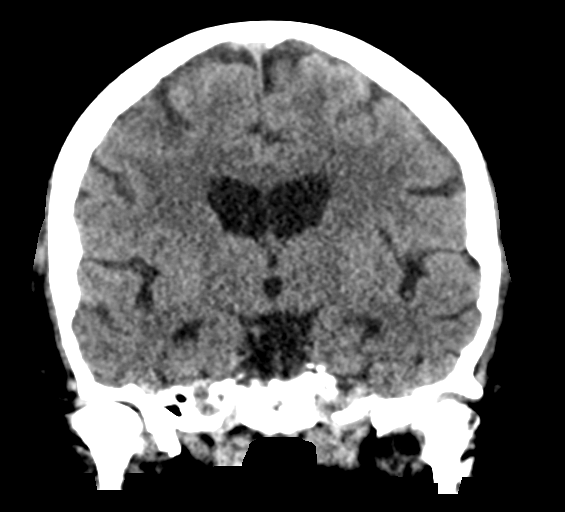

[Series 5: head 3.0 mpr sag · sagittal · 0.31mm/px · 3 of 57 slices shown]
[im 19/57  brain]
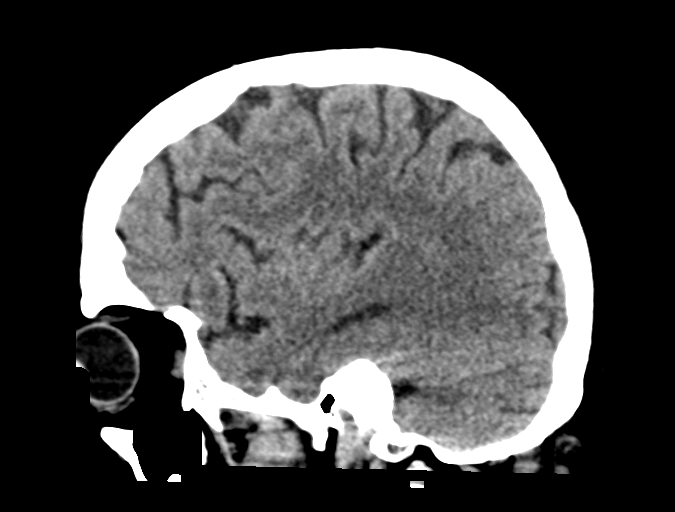
[im 29/57  brain]
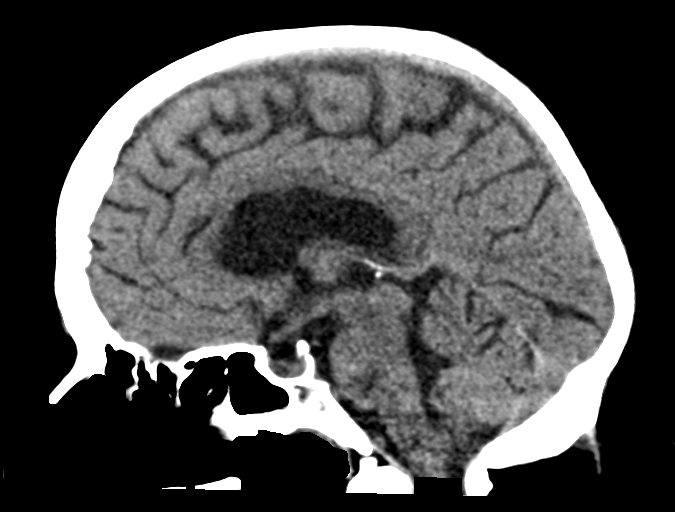
[im 38/57  brain]
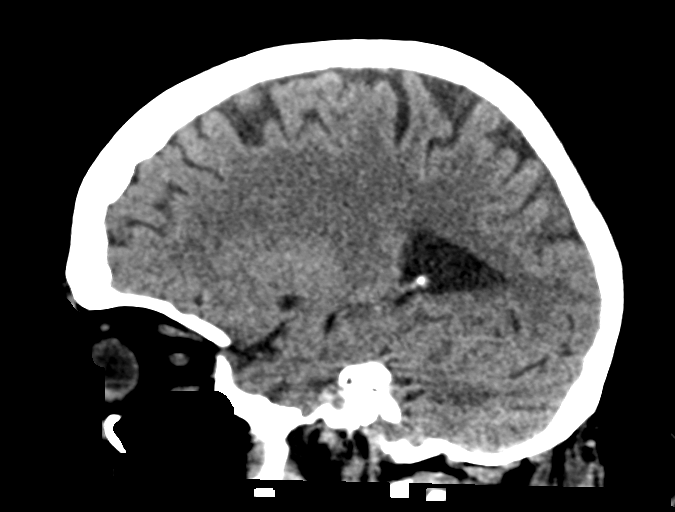

[15 of 47 positions shown; findings below may reference images not displayed]

FINDINGS: Brain: No evidence of acute infarction, hemorrhage, extra-axial
collection, ventriculomegaly, or mass effect. Generalized cerebral
atrophy. Periventricular white matter low attenuation likely
secondary to microangiopathy.

Vascular: Cerebrovascular atherosclerotic calcifications are noted.

Skull: Negative for fracture or focal lesion.

Sinuses/Orbits: Visualized portions of the orbits are unremarkable.
Visualized portions of the paranasal sinuses and mastoid air cells
are unremarkable.

Other: None.
IMPRESSION: No acute intracranial pathology.

## 2017-03-27 IMAGING — DX DG CHEST 2V
2 series · 2 of 2 positions shown · non-contrast
Comparison: CT chest August 15, 2016

CLINICAL DATA: Acute onset chest pain, dizziness and altered level
of consciousness at 3005 hours. History of CHF, pulmonary embolism,
diabetes.

EXAM:
CHEST  2 VIEW

[chest lat]
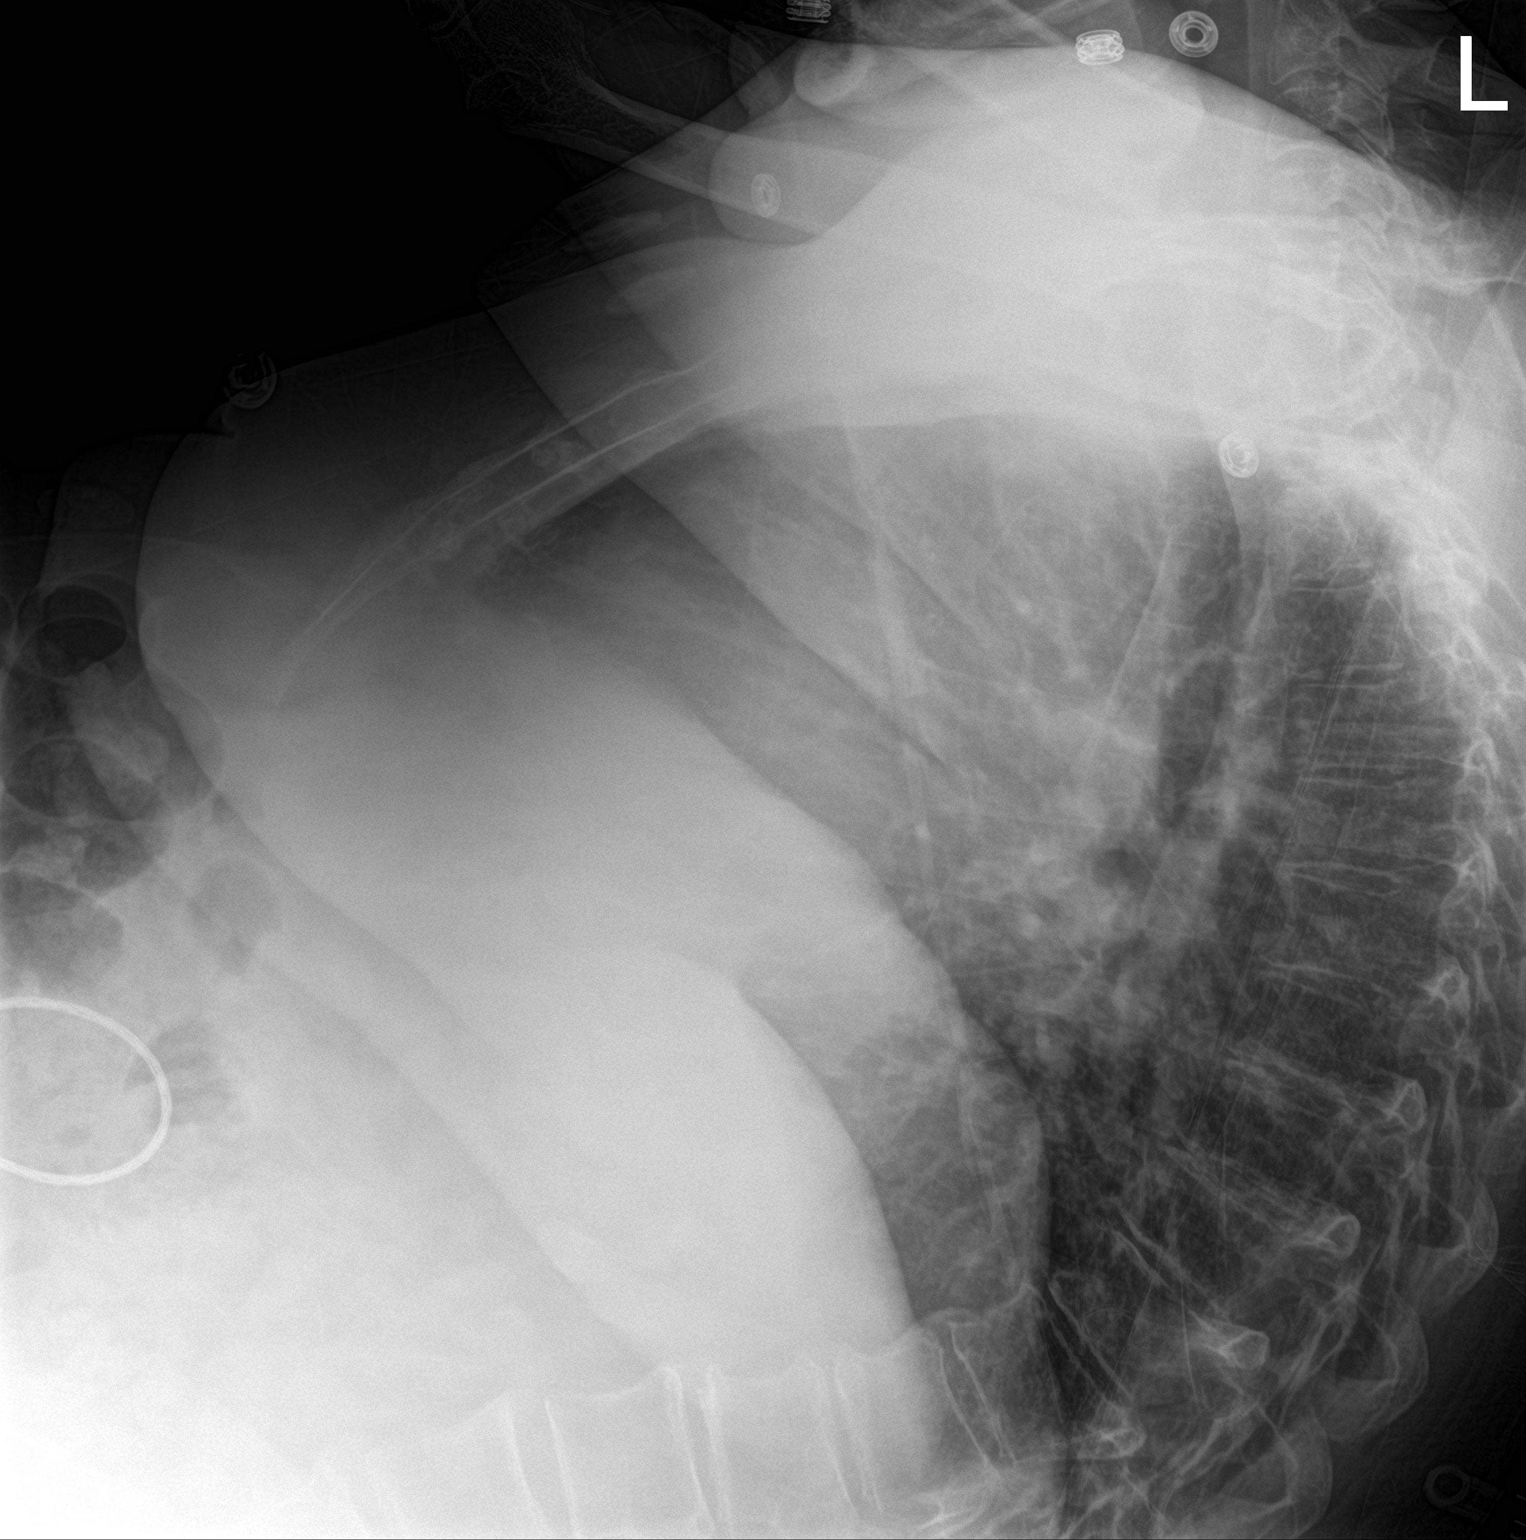

[chest ap]
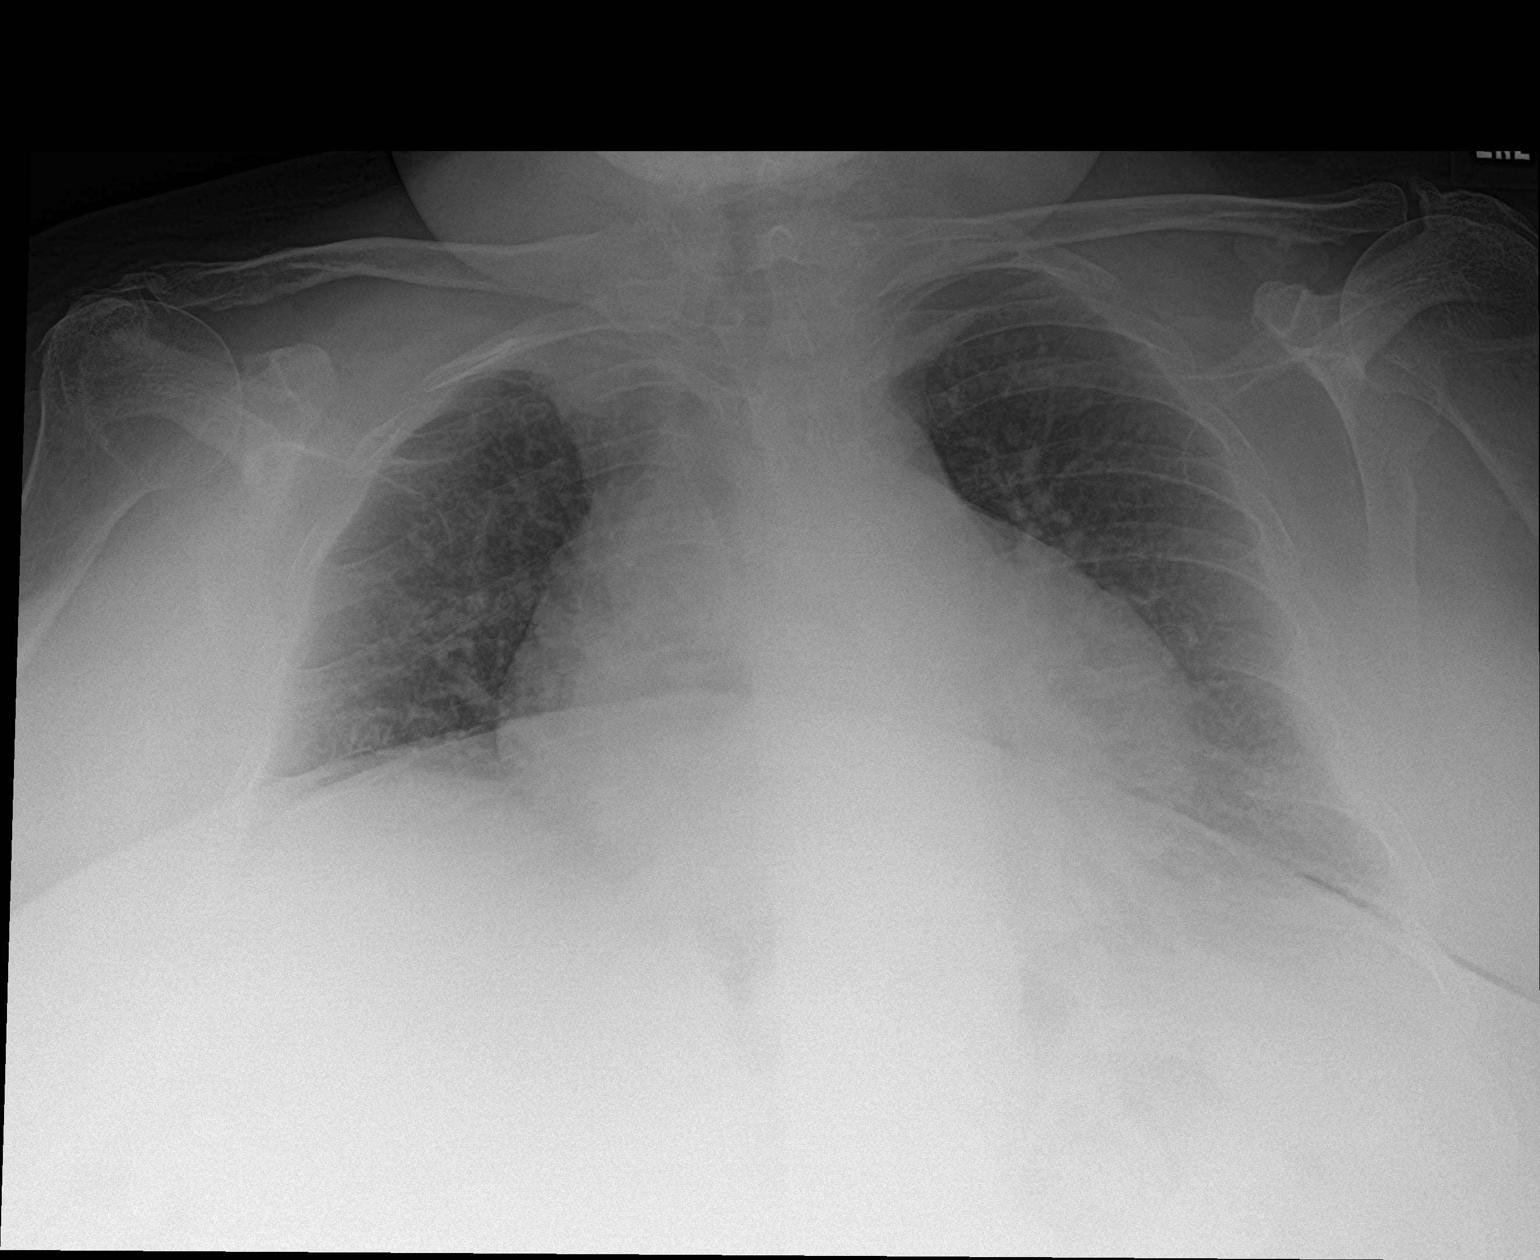

[2 of 2 positions shown; findings below may reference images not displayed]

FINDINGS: Cardiac silhouette appears mildly enlarged even with consideration
to this low inspiratory AP technique with crowded vascular markings.
Mild bronchitic changes. Elevated RIGHT hemidiaphragm without
pleural effusion or focal consolidation. No pneumothorax.
High-riding humeral heads associated with old rotator cuff injury
though, may be projectional considering apical lordotic view. Large
body habitus.
IMPRESSION: Mild cardiomegaly.  Mild bronchitic changes.

## 2017-03-27 IMAGING — CT CT ANGIO CHEST
2 of 6 series · 18 of 36 positions shown · IV contrast (Omni 300)
Comparison: None.

CLINICAL DATA: Chest pain, dizziness, elevated D-dimer

EXAM:
CT ANGIOGRAPHY CHEST WITH CONTRAST
TECHNIQUE: Multidetector CT imaging of the chest was performed using the
standard protocol during bolus administration of intravenous
contrast. Multiplanar CT image reconstructions and MIPs were
obtained to evaluate the vascular anatomy.
CONTRAST:  80 cc Isovue

[Series 6: pe thins · axial · 0.72mm/px · z∈[+1078,+1307]mm · 17 of 259 slices shown]
[im 15/259  lung]
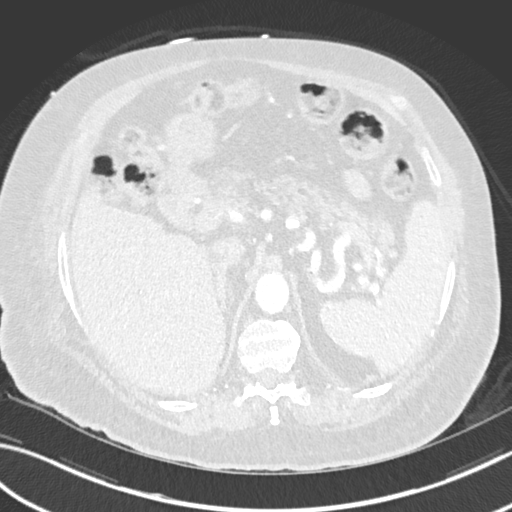
[im 29/259  mediastinal]
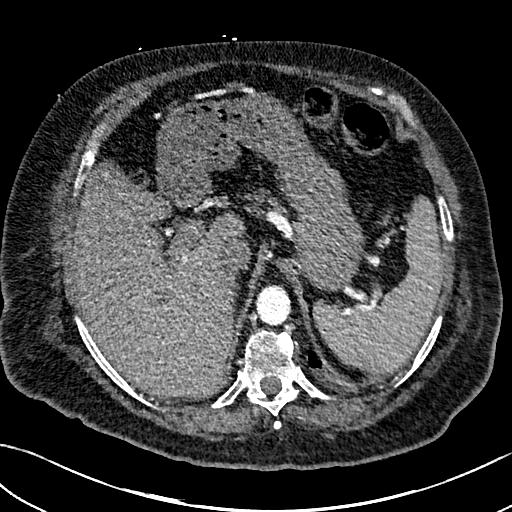
[im 44/259  lung]
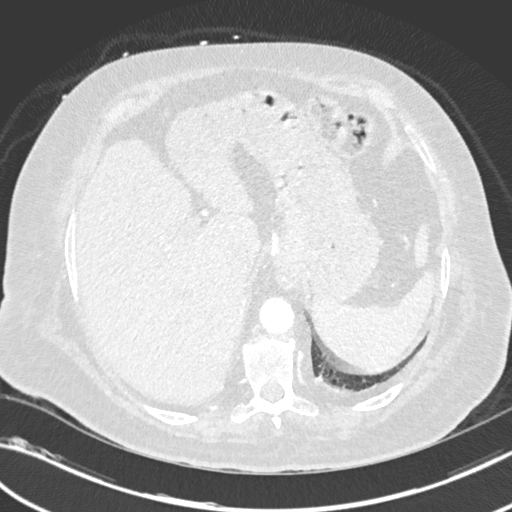
[im 58/259  mediastinal]
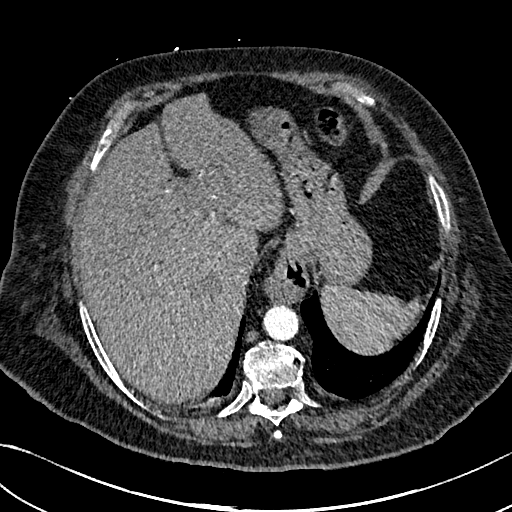
[im 72/259  lung]
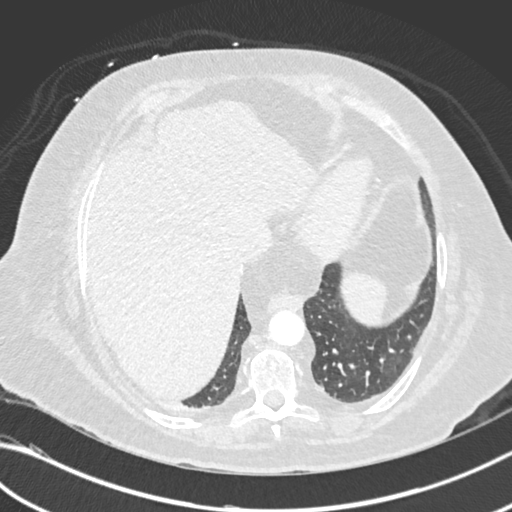
[im 87/259  mediastinal]
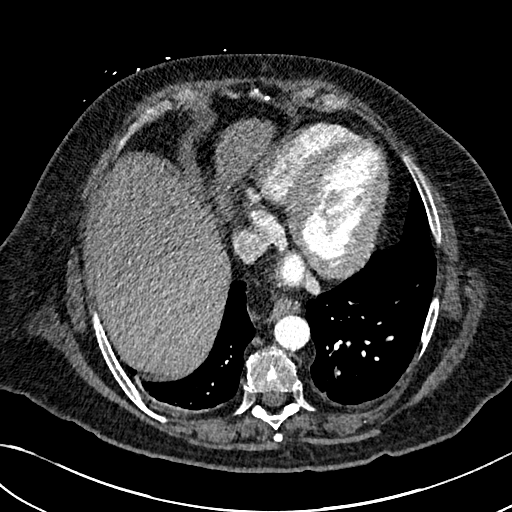
[im 101/259  lung]
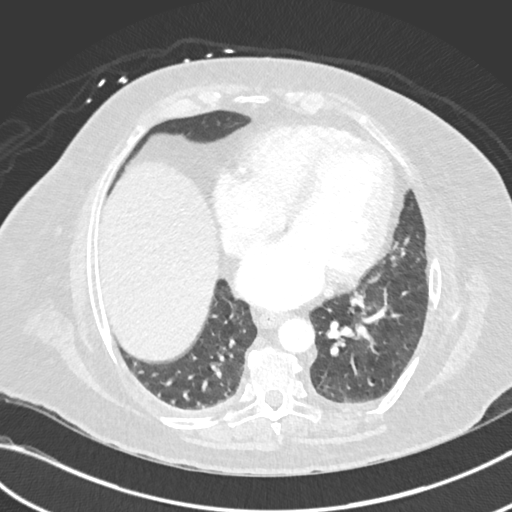
[im 115/259  mediastinal]
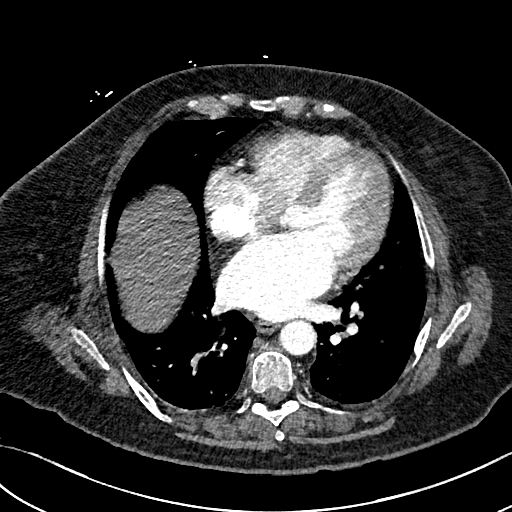
[im 130/259  lung]
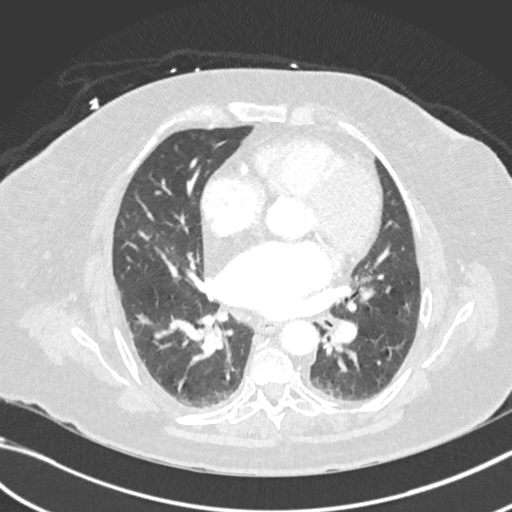
[im 144/259  mediastinal]
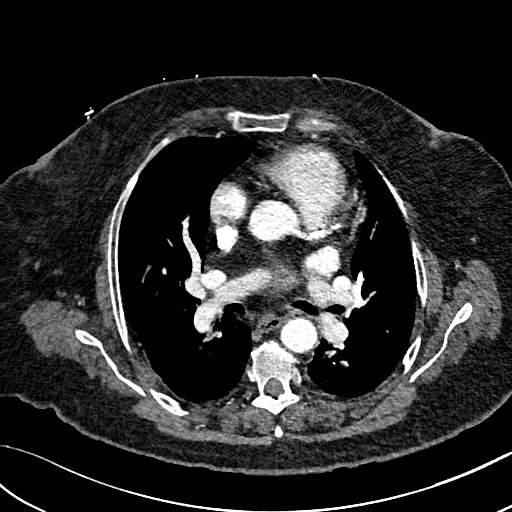
[im 158/259  lung]
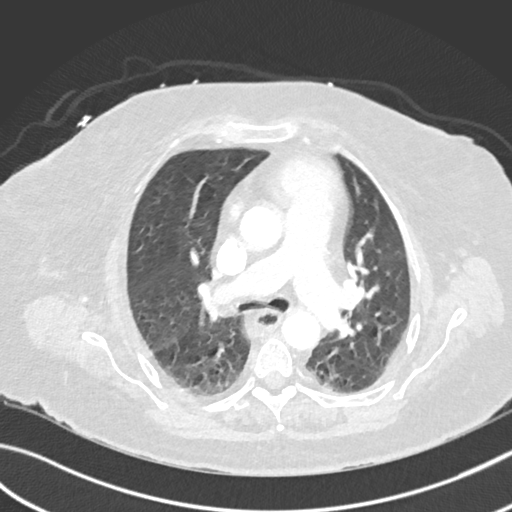
[im 173/259  mediastinal]
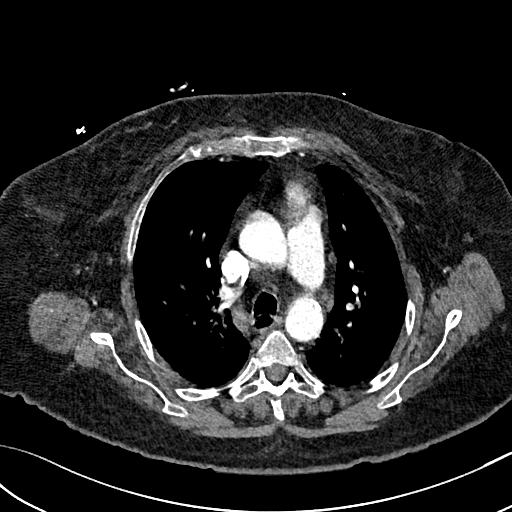
[im 187/259  lung]
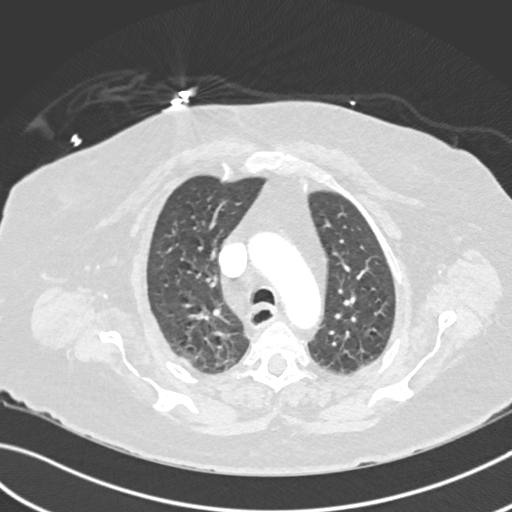
[im 201/259  mediastinal]
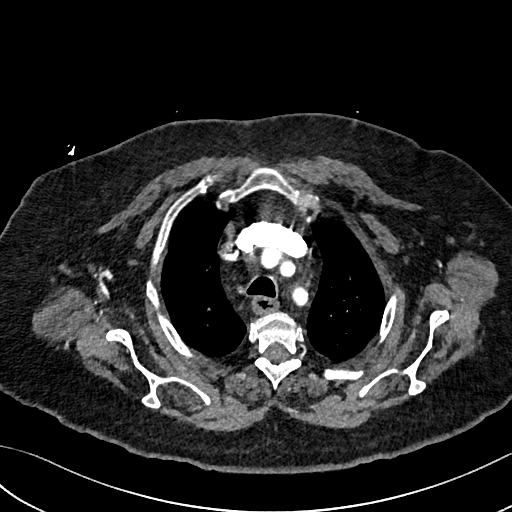
[im 216/259  lung]
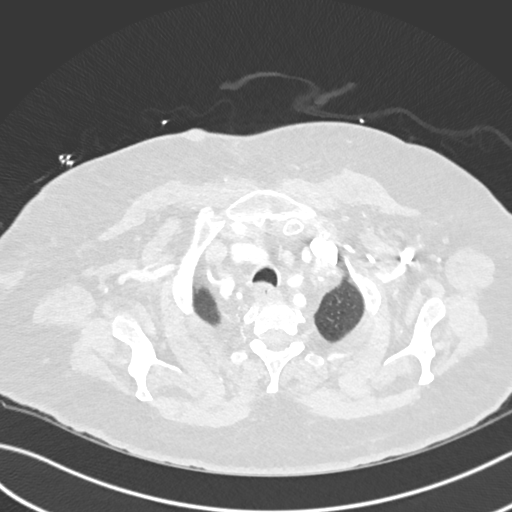
[im 230/259  mediastinal]
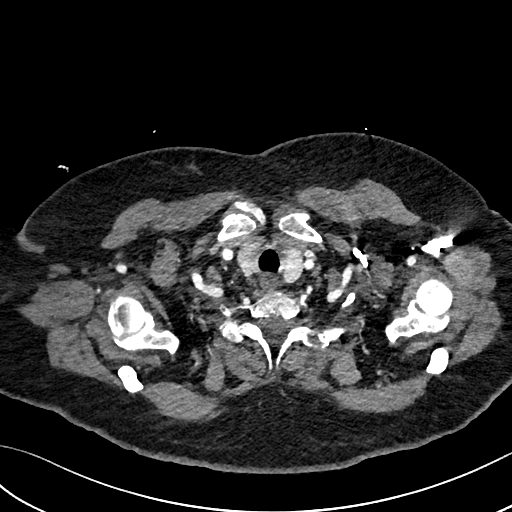
[im 244/259  lung]
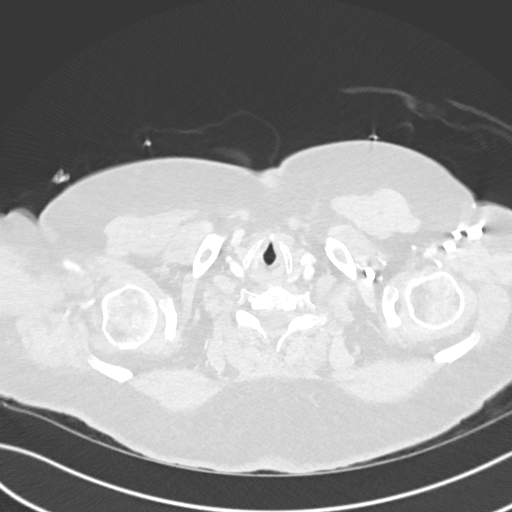

[Series 7: pe 2mm cor · coronal · 0.51mm/px · 1 of 151 slices shown]
[im 76/151  mediastinal]
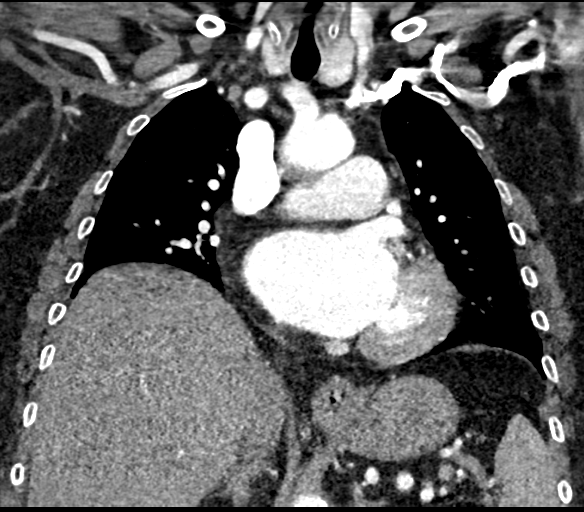

[18 of 36 positions shown; findings below may reference images not displayed]

FINDINGS: Cardiovascular: Cardiomegaly is noted. No pericardial effusion. No
aortic aneurysm. No definite pulmonary embolus is noted. No
pulmonary embolus in lobar arterial branches. Suboptimal evaluation
of segmental arterial branches due to artifacts from patient's large
body habitus. Atherosclerotic calcifications are noted thoracic
aorta and coronary arteries.

Mediastinum/Nodes: There is no mediastinal hematoma or adenopathy.

Lungs/Pleura: Images of the lung parenchyma shows no infiltrate or
pulmonary edema. Minimal dependent atelectasis lung bases
posteriorly. No bronchiectasis. No fibrotic changes. No emphysema.
No pneumothorax.

Upper Abdomen: The visualized upper abdomen shows mild fatty
infiltration of the liver. Small hiatal hernia. No adrenal gland
mass is noted. Partial fatty replacement of the pancreas. Visualized
spleen is unremarkable.

Musculoskeletal: No destructive bony lesions are noted. Sagittal
images of the spine shows mild degenerative changes thoracic spine.
Sagittal view of the sternum is unremarkable.

Review of the MIP images confirms the above findings.
IMPRESSION: 1. No definite pulmonary embolus is noted. Suboptimal evaluation of
segmental arterial branches due to artifacts from patient's large
body habitus.
2. No infiltrate or pulmonary edema. No mediastinal hematoma or
adenopathy.
3. Small hiatal hernia.
4. Cardiomegaly is noted. Atherosclerotic calcifications of thoracic
aorta and coronary arteries.
5. Mild fatty infiltration of the liver.

## 2017-03-29 DIAGNOSIS — L439 Lichen planus, unspecified: Secondary | ICD-10-CM | POA: Diagnosis not present

## 2017-03-29 DIAGNOSIS — L280 Lichen simplex chronicus: Secondary | ICD-10-CM | POA: Diagnosis not present

## 2017-03-30 DIAGNOSIS — H25812 Combined forms of age-related cataract, left eye: Secondary | ICD-10-CM | POA: Diagnosis not present

## 2017-03-30 DIAGNOSIS — H2512 Age-related nuclear cataract, left eye: Secondary | ICD-10-CM | POA: Diagnosis not present

## 2017-04-05 DIAGNOSIS — H2511 Age-related nuclear cataract, right eye: Secondary | ICD-10-CM | POA: Diagnosis not present

## 2017-04-05 DIAGNOSIS — H25011 Cortical age-related cataract, right eye: Secondary | ICD-10-CM | POA: Diagnosis not present

## 2017-04-12 DIAGNOSIS — F29 Unspecified psychosis not due to a substance or known physiological condition: Secondary | ICD-10-CM | POA: Diagnosis not present

## 2017-04-20 DIAGNOSIS — H25811 Combined forms of age-related cataract, right eye: Secondary | ICD-10-CM | POA: Diagnosis not present

## 2017-04-20 DIAGNOSIS — H2511 Age-related nuclear cataract, right eye: Secondary | ICD-10-CM | POA: Diagnosis not present

## 2017-04-21 DIAGNOSIS — H2511 Age-related nuclear cataract, right eye: Secondary | ICD-10-CM | POA: Diagnosis not present

## 2017-05-02 DIAGNOSIS — I129 Hypertensive chronic kidney disease with stage 1 through stage 4 chronic kidney disease, or unspecified chronic kidney disease: Secondary | ICD-10-CM | POA: Diagnosis not present

## 2017-05-02 DIAGNOSIS — D509 Iron deficiency anemia, unspecified: Secondary | ICD-10-CM | POA: Diagnosis not present

## 2017-05-02 DIAGNOSIS — E559 Vitamin D deficiency, unspecified: Secondary | ICD-10-CM | POA: Diagnosis not present

## 2017-05-02 DIAGNOSIS — N183 Chronic kidney disease, stage 3 (moderate): Secondary | ICD-10-CM | POA: Diagnosis not present

## 2017-05-02 DIAGNOSIS — Z79899 Other long term (current) drug therapy: Secondary | ICD-10-CM | POA: Diagnosis not present

## 2017-05-02 DIAGNOSIS — R809 Proteinuria, unspecified: Secondary | ICD-10-CM | POA: Diagnosis not present

## 2017-05-11 ENCOUNTER — Encounter: Payer: Self-pay | Admitting: Podiatry

## 2017-05-11 ENCOUNTER — Ambulatory Visit (INDEPENDENT_AMBULATORY_CARE_PROVIDER_SITE_OTHER): Payer: Medicare Other | Admitting: Podiatry

## 2017-05-11 DIAGNOSIS — E1149 Type 2 diabetes mellitus with other diabetic neurological complication: Secondary | ICD-10-CM | POA: Diagnosis not present

## 2017-05-11 DIAGNOSIS — M79674 Pain in right toe(s): Secondary | ICD-10-CM | POA: Diagnosis not present

## 2017-05-11 DIAGNOSIS — Q828 Other specified congenital malformations of skin: Secondary | ICD-10-CM | POA: Diagnosis not present

## 2017-05-11 DIAGNOSIS — E1140 Type 2 diabetes mellitus with diabetic neuropathy, unspecified: Secondary | ICD-10-CM | POA: Diagnosis not present

## 2017-05-11 DIAGNOSIS — M79675 Pain in left toe(s): Secondary | ICD-10-CM

## 2017-05-11 DIAGNOSIS — B351 Tinea unguium: Secondary | ICD-10-CM

## 2017-05-11 NOTE — Progress Notes (Signed)
Subjective:    Patient ID: [name], female   DOB: 66 y.o.   MRN: [id number]   HPI long-term diabetic with chronic lesion underneath the right midfoot and left foot with the right measuring 1 cm x 8 mm in the left 6 x 6 mm. 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 Patient has keratotic lesion sub-first metatarsal right sub-fourth metatarsal left with above-mentioned size did lesion     Assessment:    At risk diabetic with chronic lesion formation     Plan:    [name] and education rendered and debrided lesions bilateral with no iatrogenic bleeding noted

## 2017-07-20 DIAGNOSIS — J3489 Other specified disorders of nose and nasal sinuses: Secondary | ICD-10-CM | POA: Diagnosis not present

## 2017-07-20 DIAGNOSIS — B369 Superficial mycosis, unspecified: Secondary | ICD-10-CM | POA: Diagnosis not present

## 2017-07-20 DIAGNOSIS — R05 Cough: Secondary | ICD-10-CM | POA: Diagnosis not present

## 2017-07-20 DIAGNOSIS — R300 Dysuria: Secondary | ICD-10-CM | POA: Diagnosis not present

## 2017-07-20 DIAGNOSIS — N390 Urinary tract infection, site not specified: Secondary | ICD-10-CM | POA: Diagnosis not present

## 2017-07-25 ENCOUNTER — Ambulatory Visit: Payer: Medicare Other

## 2017-08-01 ENCOUNTER — Ambulatory Visit: Payer: Medicare Other

## 2017-08-02 ENCOUNTER — Ambulatory Visit: Payer: Medicare Other

## 2017-08-23 DIAGNOSIS — F29 Unspecified psychosis not due to a substance or known physiological condition: Secondary | ICD-10-CM | POA: Diagnosis not present

## 2017-08-25 DIAGNOSIS — F329 Major depressive disorder, single episode, unspecified: Secondary | ICD-10-CM | POA: Diagnosis not present

## 2017-08-25 DIAGNOSIS — R55 Syncope and collapse: Secondary | ICD-10-CM | POA: Diagnosis not present

## 2017-08-25 DIAGNOSIS — Z7982 Long term (current) use of aspirin: Secondary | ICD-10-CM | POA: Diagnosis not present

## 2017-08-25 DIAGNOSIS — R10814 Left lower quadrant abdominal tenderness: Secondary | ICD-10-CM | POA: Diagnosis not present

## 2017-08-25 DIAGNOSIS — R279 Unspecified lack of coordination: Secondary | ICD-10-CM | POA: Diagnosis not present

## 2017-08-25 DIAGNOSIS — R197 Diarrhea, unspecified: Secondary | ICD-10-CM | POA: Diagnosis not present

## 2017-08-25 DIAGNOSIS — Z79899 Other long term (current) drug therapy: Secondary | ICD-10-CM | POA: Diagnosis not present

## 2017-08-25 DIAGNOSIS — I509 Heart failure, unspecified: Secondary | ICD-10-CM | POA: Diagnosis not present

## 2017-08-25 DIAGNOSIS — E119 Type 2 diabetes mellitus without complications: Secondary | ICD-10-CM | POA: Diagnosis not present

## 2017-08-25 DIAGNOSIS — Z7401 Bed confinement status: Secondary | ICD-10-CM | POA: Diagnosis not present

## 2017-08-25 DIAGNOSIS — R110 Nausea: Secondary | ICD-10-CM | POA: Diagnosis not present

## 2017-08-25 DIAGNOSIS — E039 Hypothyroidism, unspecified: Secondary | ICD-10-CM | POA: Diagnosis not present

## 2017-08-25 DIAGNOSIS — Z8614 Personal history of Methicillin resistant Staphylococcus aureus infection: Secondary | ICD-10-CM | POA: Diagnosis not present

## 2017-08-25 DIAGNOSIS — E7800 Pure hypercholesterolemia, unspecified: Secondary | ICD-10-CM | POA: Diagnosis not present

## 2017-08-25 DIAGNOSIS — I4510 Unspecified right bundle-branch block: Secondary | ICD-10-CM | POA: Diagnosis not present

## 2017-08-25 DIAGNOSIS — K2970 Gastritis, unspecified, without bleeding: Secondary | ICD-10-CM | POA: Diagnosis not present

## 2017-08-25 DIAGNOSIS — R1030 Lower abdominal pain, unspecified: Secondary | ICD-10-CM | POA: Diagnosis not present

## 2017-08-25 DIAGNOSIS — K3189 Other diseases of stomach and duodenum: Secondary | ICD-10-CM | POA: Diagnosis not present

## 2017-08-31 ENCOUNTER — Ambulatory Visit: Payer: Medicare Other | Admitting: Podiatry

## 2017-09-08 ENCOUNTER — Ambulatory Visit (INDEPENDENT_AMBULATORY_CARE_PROVIDER_SITE_OTHER): Payer: Medicare Other | Admitting: Podiatry

## 2017-09-08 ENCOUNTER — Encounter: Payer: Self-pay | Admitting: Podiatry

## 2017-09-08 DIAGNOSIS — M79675 Pain in left toe(s): Secondary | ICD-10-CM | POA: Diagnosis not present

## 2017-09-08 DIAGNOSIS — E1140 Type 2 diabetes mellitus with diabetic neuropathy, unspecified: Secondary | ICD-10-CM | POA: Diagnosis not present

## 2017-09-08 DIAGNOSIS — E1149 Type 2 diabetes mellitus with other diabetic neurological complication: Secondary | ICD-10-CM

## 2017-09-08 DIAGNOSIS — B351 Tinea unguium: Secondary | ICD-10-CM | POA: Diagnosis not present

## 2017-09-08 DIAGNOSIS — M79674 Pain in right toe(s): Secondary | ICD-10-CM | POA: Diagnosis not present

## 2017-09-08 DIAGNOSIS — Q828 Other specified congenital malformations of skin: Secondary | ICD-10-CM

## 2017-09-08 DIAGNOSIS — L98491 Non-pressure chronic ulcer of skin of other sites limited to breakdown of skin: Secondary | ICD-10-CM

## 2017-09-08 NOTE — Progress Notes (Signed)
Subjective:   Patient ID: [name], female   DOB: 67 y.o.   MRN: [id number]   HPI Patient presents with elongated nails 1-5 both feet that are incurvated sore in the corners and presents with a keratotic lesion sub-first metatarsal right that is localized in nature with discoloration but no proximal edema erythema or drainage noted.   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  Gave strict instructions of any redness should occur or any drainage to let us know immediately and if any systemic signs of infection to go straight to the hospital.  Also discussed diabetic shoes we did get her in with family physician to discuss but I do think these would be of great benefit the patient     Plan:  Above discussed concerning this condition

## 2017-09-28 DIAGNOSIS — Z79899 Other long term (current) drug therapy: Secondary | ICD-10-CM | POA: Diagnosis not present

## 2017-09-28 DIAGNOSIS — R809 Proteinuria, unspecified: Secondary | ICD-10-CM | POA: Diagnosis not present

## 2017-09-28 DIAGNOSIS — N183 Chronic kidney disease, stage 3 (moderate): Secondary | ICD-10-CM | POA: Diagnosis not present

## 2017-09-28 DIAGNOSIS — D509 Iron deficiency anemia, unspecified: Secondary | ICD-10-CM | POA: Diagnosis not present

## 2017-09-28 DIAGNOSIS — E559 Vitamin D deficiency, unspecified: Secondary | ICD-10-CM | POA: Diagnosis not present

## 2017-09-28 DIAGNOSIS — I129 Hypertensive chronic kidney disease with stage 1 through stage 4 chronic kidney disease, or unspecified chronic kidney disease: Secondary | ICD-10-CM | POA: Diagnosis not present

## 2017-10-05 DIAGNOSIS — R0682 Tachypnea, not elsewhere classified: Secondary | ICD-10-CM | POA: Diagnosis not present

## 2017-10-05 DIAGNOSIS — I509 Heart failure, unspecified: Secondary | ICD-10-CM | POA: Diagnosis not present

## 2017-10-05 DIAGNOSIS — E119 Type 2 diabetes mellitus without complications: Secondary | ICD-10-CM | POA: Diagnosis not present

## 2017-10-05 DIAGNOSIS — R0602 Shortness of breath: Secondary | ICD-10-CM | POA: Diagnosis not present

## 2017-10-05 DIAGNOSIS — Z8614 Personal history of Methicillin resistant Staphylococcus aureus infection: Secondary | ICD-10-CM | POA: Diagnosis not present

## 2017-10-05 DIAGNOSIS — F209 Schizophrenia, unspecified: Secondary | ICD-10-CM | POA: Diagnosis not present

## 2017-10-06 DIAGNOSIS — R0602 Shortness of breath: Secondary | ICD-10-CM | POA: Diagnosis not present

## 2017-10-12 DIAGNOSIS — Z961 Presence of intraocular lens: Secondary | ICD-10-CM | POA: Diagnosis not present

## 2017-10-12 DIAGNOSIS — H01002 Unspecified blepharitis right lower eyelid: Secondary | ICD-10-CM | POA: Diagnosis not present

## 2017-10-12 DIAGNOSIS — H26493 Other secondary cataract, bilateral: Secondary | ICD-10-CM | POA: Diagnosis not present

## 2017-10-12 DIAGNOSIS — H16223 Keratoconjunctivitis sicca, not specified as Sjogren's, bilateral: Secondary | ICD-10-CM | POA: Diagnosis not present

## 2017-10-12 DIAGNOSIS — H02841 Edema of right upper eyelid: Secondary | ICD-10-CM | POA: Diagnosis not present

## 2017-10-12 DIAGNOSIS — H02842 Edema of right lower eyelid: Secondary | ICD-10-CM | POA: Diagnosis not present

## 2017-10-12 DIAGNOSIS — H01004 Unspecified blepharitis left upper eyelid: Secondary | ICD-10-CM | POA: Diagnosis not present

## 2017-10-12 DIAGNOSIS — H01001 Unspecified blepharitis right upper eyelid: Secondary | ICD-10-CM | POA: Diagnosis not present

## 2017-10-12 DIAGNOSIS — H01005 Unspecified blepharitis left lower eyelid: Secondary | ICD-10-CM | POA: Diagnosis not present

## 2017-10-12 DIAGNOSIS — H04123 Dry eye syndrome of bilateral lacrimal glands: Secondary | ICD-10-CM | POA: Diagnosis not present

## 2017-10-18 DIAGNOSIS — L089 Local infection of the skin and subcutaneous tissue, unspecified: Secondary | ICD-10-CM | POA: Diagnosis not present

## 2017-10-18 DIAGNOSIS — S90821A Blister (nonthermal), right foot, initial encounter: Secondary | ICD-10-CM | POA: Diagnosis not present

## 2017-10-26 ENCOUNTER — Encounter: Payer: Self-pay | Admitting: Podiatry

## 2017-10-26 ENCOUNTER — Ambulatory Visit (INDEPENDENT_AMBULATORY_CARE_PROVIDER_SITE_OTHER): Payer: Medicare Other | Admitting: Podiatry

## 2017-10-26 DIAGNOSIS — L98491 Non-pressure chronic ulcer of skin of other sites limited to breakdown of skin: Secondary | ICD-10-CM | POA: Diagnosis not present

## 2017-10-26 DIAGNOSIS — E1140 Type 2 diabetes mellitus with diabetic neuropathy, unspecified: Secondary | ICD-10-CM

## 2017-10-26 DIAGNOSIS — E1149 Type 2 diabetes mellitus with other diabetic neurological complication: Secondary | ICD-10-CM

## 2017-10-27 NOTE — Progress Notes (Signed)
Subjective:   Patient ID: [name], female   DOB: 67 y.o.   MRN: [id number]   HPI Patient presents stating the area is starting to bother her again on the right foot.  Patient states that started in the last few weeks and she did note some drainage but not currently   ROS      Objective:  Physical Exam  Neurovascular status intact with keratotic lesion sub-first metatarsal right that upon debridement shows superficial breakdown of tissue measuring 5 x 5 mm with 1 mm of depth.  There is no subcutaneous exposure no odor or no proximal edema erythema or drainage noted.     Assessment:  Localized breakdown of tissue plantar aspect right first metatarsal that does not appear to have any extension to have any proximal extension     Plan:  Reviewed condition importance of offloading and provided pads to offload the area.  With sharp sterile instrumentation I debrided the area flushed it out and applied Iodosorb with padding and sterile dressing and instructed on soaks for her to do and also we are going to get diabetic shoes approved for her.  Gave her strict instructions of any increase in redness any drainage were to occur or any systemic signs of infection she is to contact us immediately

## 2017-11-01 DIAGNOSIS — S90821D Blister (nonthermal), right foot, subsequent encounter: Secondary | ICD-10-CM | POA: Diagnosis not present

## 2017-11-01 DIAGNOSIS — I10 Essential (primary) hypertension: Secondary | ICD-10-CM | POA: Diagnosis not present

## 2017-11-16 DIAGNOSIS — E1165 Type 2 diabetes mellitus with hyperglycemia: Secondary | ICD-10-CM | POA: Diagnosis not present

## 2017-11-16 DIAGNOSIS — F329 Major depressive disorder, single episode, unspecified: Secondary | ICD-10-CM | POA: Diagnosis not present

## 2017-11-16 DIAGNOSIS — E7800 Pure hypercholesterolemia, unspecified: Secondary | ICD-10-CM | POA: Diagnosis not present

## 2017-11-16 DIAGNOSIS — K219 Gastro-esophageal reflux disease without esophagitis: Secondary | ICD-10-CM | POA: Diagnosis not present

## 2017-11-16 DIAGNOSIS — Z299 Encounter for prophylactic measures, unspecified: Secondary | ICD-10-CM | POA: Diagnosis not present

## 2017-11-16 DIAGNOSIS — J449 Chronic obstructive pulmonary disease, unspecified: Secondary | ICD-10-CM | POA: Diagnosis not present

## 2017-11-16 DIAGNOSIS — N189 Chronic kidney disease, unspecified: Secondary | ICD-10-CM | POA: Diagnosis not present

## 2017-11-16 DIAGNOSIS — M1990 Unspecified osteoarthritis, unspecified site: Secondary | ICD-10-CM | POA: Diagnosis not present

## 2017-11-16 DIAGNOSIS — F419 Anxiety disorder, unspecified: Secondary | ICD-10-CM | POA: Diagnosis not present

## 2017-11-16 DIAGNOSIS — I2510 Atherosclerotic heart disease of native coronary artery without angina pectoris: Secondary | ICD-10-CM | POA: Diagnosis not present

## 2017-11-16 DIAGNOSIS — Z6834 Body mass index (BMI) 34.0-34.9, adult: Secondary | ICD-10-CM | POA: Diagnosis not present

## 2017-11-16 DIAGNOSIS — I10 Essential (primary) hypertension: Secondary | ICD-10-CM | POA: Diagnosis not present

## 2017-11-22 ENCOUNTER — Encounter: Payer: Self-pay | Admitting: Podiatry

## 2017-11-22 ENCOUNTER — Ambulatory Visit (INDEPENDENT_AMBULATORY_CARE_PROVIDER_SITE_OTHER): Payer: Medicare Other | Admitting: Podiatry

## 2017-11-22 ENCOUNTER — Ambulatory Visit (INDEPENDENT_AMBULATORY_CARE_PROVIDER_SITE_OTHER): Payer: Medicare Other

## 2017-11-22 ENCOUNTER — Ambulatory Visit: Payer: Medicare Other

## 2017-11-22 DIAGNOSIS — E1142 Type 2 diabetes mellitus with diabetic polyneuropathy: Secondary | ICD-10-CM

## 2017-11-22 DIAGNOSIS — L97501 Non-pressure chronic ulcer of other part of unspecified foot limited to breakdown of skin: Secondary | ICD-10-CM

## 2017-11-22 DIAGNOSIS — L98491 Non-pressure chronic ulcer of skin of other sites limited to breakdown of skin: Secondary | ICD-10-CM

## 2017-11-22 MED ORDER — MUPIROCIN CALCIUM 2 % EX CREA: 1.0000 "application " | TOPICAL_CREAM | Freq: Two times a day (BID) | CUTANEOUS | 0 refills | Status: DC

## 2017-11-22 NOTE — Progress Notes (Addendum)
This patient presents the office today for an evaluation of her second toe left foot.  She was seen earlier today by Liliane Channel  and he recommended that she be seen by one of the doctors.  She also has a closed callus under the ball of the right foot. She says that the second toe has started to drain the last few weeks and is worsening.  She has attempted soaking this area but the problem continues.  She presents to  the office today for an evaluation of the callus right foot and draining ulcer second toe left foot.   General Appearance  Alert, conversant and in no acute stress.  Neurovascular status is intact on both feet.    Nails Normal nails noted with no evidence of fungal or bacterial infection.  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  Contracture second toe left foot.  Skin  Closed callus sub 1 right foot.  A diabetic ulcer is noted at the distal tip of the second toe left foot .  There is reddish  discoloration noted to the second digit of the left foot.  There is no evidence of drainage or pus noted second toe left foot.  Ulcer measures 2 mm. X 2 mm. With necrotic tissue noted surrounding ulcer second left   Diabetic ulcer second toe left.    ROV>  X-rays were taken revealing no evidence of bony pathology second toe.  The ulcer was bandaged with Neosporin and a dry sterile dressing.  Patient was given home instructions and told to the bandage her toe after the soaks.  Patient was prescribed bactoban.  Patient will be prescribed doxycycline.  RTC 2 weeks.   Gardiner Barefoot DPM

## 2017-11-23 MED ORDER — DOXYCYCLINE HYCLATE 100 MG PO TABS: 100.0000 mg | ORAL_TABLET | Freq: Two times a day (BID) | ORAL | 0 refills | Status: DC

## 2017-11-23 NOTE — Addendum Note (Signed)
Addended byDeidre Ala, Tivon Lemoine L on: 11/23/2017 08:59 AM   Modules accepted: Orders

## 2017-12-06 ENCOUNTER — Ambulatory Visit: Payer: Medicare Other | Admitting: Podiatry

## 2017-12-13 DIAGNOSIS — F29 Unspecified psychosis not due to a substance or known physiological condition: Secondary | ICD-10-CM | POA: Diagnosis not present

## 2017-12-22 DIAGNOSIS — L110 Acquired keratosis follicularis: Secondary | ICD-10-CM | POA: Diagnosis not present

## 2017-12-22 DIAGNOSIS — E1140 Type 2 diabetes mellitus with diabetic neuropathy, unspecified: Secondary | ICD-10-CM | POA: Diagnosis not present

## 2017-12-23 DIAGNOSIS — L84 Corns and callosities: Secondary | ICD-10-CM | POA: Diagnosis not present

## 2017-12-23 DIAGNOSIS — M2041 Other hammer toe(s) (acquired), right foot: Secondary | ICD-10-CM | POA: Diagnosis not present

## 2017-12-23 DIAGNOSIS — E0840 Diabetes mellitus due to underlying condition with diabetic neuropathy, unspecified: Secondary | ICD-10-CM | POA: Diagnosis not present

## 2017-12-23 DIAGNOSIS — B351 Tinea unguium: Secondary | ICD-10-CM | POA: Diagnosis not present

## 2017-12-23 DIAGNOSIS — M79674 Pain in right toe(s): Secondary | ICD-10-CM | POA: Diagnosis not present

## 2017-12-28 DIAGNOSIS — Z299 Encounter for prophylactic measures, unspecified: Secondary | ICD-10-CM | POA: Diagnosis not present

## 2017-12-28 DIAGNOSIS — E7800 Pure hypercholesterolemia, unspecified: Secondary | ICD-10-CM | POA: Diagnosis not present

## 2017-12-28 DIAGNOSIS — N189 Chronic kidney disease, unspecified: Secondary | ICD-10-CM | POA: Diagnosis not present

## 2017-12-28 DIAGNOSIS — Z6838 Body mass index (BMI) 38.0-38.9, adult: Secondary | ICD-10-CM | POA: Diagnosis not present

## 2017-12-28 DIAGNOSIS — Z1211 Encounter for screening for malignant neoplasm of colon: Secondary | ICD-10-CM | POA: Diagnosis not present

## 2017-12-28 DIAGNOSIS — E039 Hypothyroidism, unspecified: Secondary | ICD-10-CM | POA: Diagnosis not present

## 2017-12-28 DIAGNOSIS — Z1339 Encounter for screening examination for other mental health and behavioral disorders: Secondary | ICD-10-CM | POA: Diagnosis not present

## 2017-12-28 DIAGNOSIS — E1165 Type 2 diabetes mellitus with hyperglycemia: Secondary | ICD-10-CM | POA: Diagnosis not present

## 2017-12-28 DIAGNOSIS — E1122 Type 2 diabetes mellitus with diabetic chronic kidney disease: Secondary | ICD-10-CM | POA: Diagnosis not present

## 2017-12-28 DIAGNOSIS — Z79899 Other long term (current) drug therapy: Secondary | ICD-10-CM | POA: Diagnosis not present

## 2017-12-28 DIAGNOSIS — Z0000 Encounter for general adult medical examination without abnormal findings: Secondary | ICD-10-CM | POA: Diagnosis not present

## 2017-12-28 DIAGNOSIS — Z1331 Encounter for screening for depression: Secondary | ICD-10-CM | POA: Diagnosis not present

## 2017-12-28 DIAGNOSIS — Z7189 Other specified counseling: Secondary | ICD-10-CM | POA: Diagnosis not present

## 2017-12-29 ENCOUNTER — Ambulatory Visit (INDEPENDENT_AMBULATORY_CARE_PROVIDER_SITE_OTHER): Payer: Medicare Other | Admitting: Orthotics

## 2017-12-29 DIAGNOSIS — E1142 Type 2 diabetes mellitus with diabetic polyneuropathy: Secondary | ICD-10-CM

## 2017-12-29 DIAGNOSIS — L97501 Non-pressure chronic ulcer of other part of unspecified foot limited to breakdown of skin: Secondary | ICD-10-CM | POA: Diagnosis not present

## 2017-12-29 DIAGNOSIS — L98491 Non-pressure chronic ulcer of skin of other sites limited to breakdown of skin: Secondary | ICD-10-CM | POA: Diagnosis not present

## 2017-12-29 NOTE — Progress Notes (Signed)

## 2017-12-30 DIAGNOSIS — M79671 Pain in right foot: Secondary | ICD-10-CM | POA: Diagnosis not present

## 2017-12-30 DIAGNOSIS — M79672 Pain in left foot: Secondary | ICD-10-CM | POA: Diagnosis not present

## 2018-01-02 DIAGNOSIS — Z299 Encounter for prophylactic measures, unspecified: Secondary | ICD-10-CM | POA: Diagnosis not present

## 2018-01-02 DIAGNOSIS — M7989 Other specified soft tissue disorders: Secondary | ICD-10-CM | POA: Diagnosis not present

## 2018-01-02 DIAGNOSIS — Z6838 Body mass index (BMI) 38.0-38.9, adult: Secondary | ICD-10-CM | POA: Diagnosis not present

## 2018-01-02 DIAGNOSIS — I10 Essential (primary) hypertension: Secondary | ICD-10-CM | POA: Diagnosis not present

## 2018-01-02 DIAGNOSIS — H5711 Ocular pain, right eye: Secondary | ICD-10-CM | POA: Diagnosis not present

## 2018-01-02 DIAGNOSIS — E1165 Type 2 diabetes mellitus with hyperglycemia: Secondary | ICD-10-CM | POA: Diagnosis not present

## 2018-01-02 DIAGNOSIS — R51 Headache: Secondary | ICD-10-CM | POA: Diagnosis not present

## 2018-01-02 DIAGNOSIS — W19XXXA Unspecified fall, initial encounter: Secondary | ICD-10-CM | POA: Diagnosis not present

## 2018-01-02 DIAGNOSIS — R220 Localized swelling, mass and lump, head: Secondary | ICD-10-CM | POA: Diagnosis not present

## 2018-01-02 DIAGNOSIS — S0993XA Unspecified injury of face, initial encounter: Secondary | ICD-10-CM | POA: Diagnosis not present

## 2018-01-03 DIAGNOSIS — Z1231 Encounter for screening mammogram for malignant neoplasm of breast: Secondary | ICD-10-CM | POA: Diagnosis not present

## 2018-01-12 ENCOUNTER — Observation Stay (HOSPITAL_COMMUNITY)
Admission: EM | Admit: 2018-01-12 | Discharge: 2018-01-13 | Disposition: A | Discharge status: Home / Self Care | Payer: Medicare Other | Attending: Internal Medicine | Admitting: Internal Medicine

## 2018-01-12 ENCOUNTER — Encounter (HOSPITAL_COMMUNITY): Payer: Self-pay | Admitting: Emergency Medicine

## 2018-01-12 ENCOUNTER — Emergency Department (HOSPITAL_COMMUNITY): Payer: Medicare Other

## 2018-01-12 ENCOUNTER — Observation Stay (HOSPITAL_COMMUNITY): Payer: Medicare Other

## 2018-01-12 ENCOUNTER — Other Ambulatory Visit: Payer: Self-pay

## 2018-01-12 ENCOUNTER — Observation Stay (HOSPITAL_BASED_OUTPATIENT_CLINIC_OR_DEPARTMENT_OTHER): Payer: Medicare Other

## 2018-01-12 DIAGNOSIS — R079 Chest pain, unspecified: Secondary | ICD-10-CM | POA: Diagnosis not present

## 2018-01-12 DIAGNOSIS — Z7951 Long term (current) use of inhaled steroids: Secondary | ICD-10-CM | POA: Insufficient documentation

## 2018-01-12 DIAGNOSIS — Z87891 Personal history of nicotine dependence: Secondary | ICD-10-CM

## 2018-01-12 DIAGNOSIS — E785 Hyperlipidemia, unspecified: Secondary | ICD-10-CM | POA: Diagnosis not present

## 2018-01-12 DIAGNOSIS — R072 Precordial pain: Secondary | ICD-10-CM | POA: Diagnosis not present

## 2018-01-12 DIAGNOSIS — J90 Pleural effusion, not elsewhere classified: Secondary | ICD-10-CM | POA: Insufficient documentation

## 2018-01-12 DIAGNOSIS — I313 Pericardial effusion (noninflammatory): Secondary | ICD-10-CM | POA: Diagnosis not present

## 2018-01-12 DIAGNOSIS — J45909 Unspecified asthma, uncomplicated: Secondary | ICD-10-CM | POA: Insufficient documentation

## 2018-01-12 DIAGNOSIS — E1151 Type 2 diabetes mellitus with diabetic peripheral angiopathy without gangrene: Secondary | ICD-10-CM | POA: Insufficient documentation

## 2018-01-12 DIAGNOSIS — E039 Hypothyroidism, unspecified: Secondary | ICD-10-CM | POA: Insufficient documentation

## 2018-01-12 DIAGNOSIS — E1122 Type 2 diabetes mellitus with diabetic chronic kidney disease: Secondary | ICD-10-CM | POA: Insufficient documentation

## 2018-01-12 DIAGNOSIS — Z880 Allergy status to penicillin: Secondary | ICD-10-CM

## 2018-01-12 DIAGNOSIS — R0689 Other abnormalities of breathing: Secondary | ICD-10-CM | POA: Diagnosis not present

## 2018-01-12 DIAGNOSIS — G319 Degenerative disease of nervous system, unspecified: Secondary | ICD-10-CM | POA: Insufficient documentation

## 2018-01-12 DIAGNOSIS — I160 Hypertensive urgency: Secondary | ICD-10-CM | POA: Diagnosis not present

## 2018-01-12 DIAGNOSIS — K449 Diaphragmatic hernia without obstruction or gangrene: Secondary | ICD-10-CM | POA: Diagnosis not present

## 2018-01-12 DIAGNOSIS — Z8673 Personal history of transient ischemic attack (TIA), and cerebral infarction without residual deficits: Secondary | ICD-10-CM | POA: Diagnosis not present

## 2018-01-12 DIAGNOSIS — E1142 Type 2 diabetes mellitus with diabetic polyneuropathy: Secondary | ICD-10-CM | POA: Diagnosis not present

## 2018-01-12 DIAGNOSIS — F209 Schizophrenia, unspecified: Secondary | ICD-10-CM | POA: Insufficient documentation

## 2018-01-12 DIAGNOSIS — N184 Chronic kidney disease, stage 4 (severe): Secondary | ICD-10-CM | POA: Diagnosis not present

## 2018-01-12 DIAGNOSIS — M549 Dorsalgia, unspecified: Secondary | ICD-10-CM | POA: Diagnosis not present

## 2018-01-12 DIAGNOSIS — Z808 Family history of malignant neoplasm of other organs or systems: Secondary | ICD-10-CM | POA: Diagnosis not present

## 2018-01-12 DIAGNOSIS — R4182 Altered mental status, unspecified: Secondary | ICD-10-CM | POA: Diagnosis not present

## 2018-01-12 DIAGNOSIS — F329 Major depressive disorder, single episode, unspecified: Secondary | ICD-10-CM | POA: Diagnosis not present

## 2018-01-12 DIAGNOSIS — K2270 Barrett's esophagus without dysplasia: Secondary | ICD-10-CM | POA: Insufficient documentation

## 2018-01-12 DIAGNOSIS — Z7982 Long term (current) use of aspirin: Secondary | ICD-10-CM | POA: Insufficient documentation

## 2018-01-12 DIAGNOSIS — I130 Hypertensive heart and chronic kidney disease with heart failure and stage 1 through stage 4 chronic kidney disease, or unspecified chronic kidney disease: Secondary | ICD-10-CM | POA: Diagnosis not present

## 2018-01-12 DIAGNOSIS — Z86711 Personal history of pulmonary embolism: Secondary | ICD-10-CM | POA: Insufficient documentation

## 2018-01-12 DIAGNOSIS — Z8249 Family history of ischemic heart disease and other diseases of the circulatory system: Secondary | ICD-10-CM | POA: Diagnosis not present

## 2018-01-12 DIAGNOSIS — Z888 Allergy status to other drugs, medicaments and biological substances status: Secondary | ICD-10-CM

## 2018-01-12 DIAGNOSIS — Z23 Encounter for immunization: Secondary | ICD-10-CM | POA: Insufficient documentation

## 2018-01-12 DIAGNOSIS — R0789 Other chest pain: Secondary | ICD-10-CM | POA: Diagnosis not present

## 2018-01-12 DIAGNOSIS — W19XXXA Unspecified fall, initial encounter: Secondary | ICD-10-CM | POA: Diagnosis not present

## 2018-01-12 DIAGNOSIS — Z79890 Hormone replacement therapy (postmenopausal): Secondary | ICD-10-CM | POA: Diagnosis not present

## 2018-01-12 DIAGNOSIS — Z823 Family history of stroke: Secondary | ICD-10-CM | POA: Diagnosis not present

## 2018-01-12 DIAGNOSIS — R4781 Slurred speech: Secondary | ICD-10-CM | POA: Insufficient documentation

## 2018-01-12 DIAGNOSIS — I5032 Chronic diastolic (congestive) heart failure: Secondary | ICD-10-CM | POA: Insufficient documentation

## 2018-01-12 DIAGNOSIS — N179 Acute kidney failure, unspecified: Secondary | ICD-10-CM | POA: Insufficient documentation

## 2018-01-12 DIAGNOSIS — I2510 Atherosclerotic heart disease of native coronary artery without angina pectoris: Secondary | ICD-10-CM | POA: Diagnosis not present

## 2018-01-12 DIAGNOSIS — K219 Gastro-esophageal reflux disease without esophagitis: Secondary | ICD-10-CM | POA: Insufficient documentation

## 2018-01-12 DIAGNOSIS — Z79899 Other long term (current) drug therapy: Secondary | ICD-10-CM | POA: Insufficient documentation

## 2018-01-12 DIAGNOSIS — N189 Chronic kidney disease, unspecified: Secondary | ICD-10-CM

## 2018-01-12 DIAGNOSIS — R61 Generalized hyperhidrosis: Secondary | ICD-10-CM | POA: Diagnosis not present

## 2018-01-12 DIAGNOSIS — I10 Essential (primary) hypertension: Secondary | ICD-10-CM | POA: Diagnosis present

## 2018-01-12 LAB — URINALYSIS, ROUTINE W REFLEX MICROSCOPIC
Bilirubin Urine: NEGATIVE
Glucose, UA: NEGATIVE mg/dL
Ketones, ur: NEGATIVE mg/dL
Nitrite: NEGATIVE
Protein, ur: 100 mg/dL — AB
Specific Gravity, Urine: 1.015 (ref 1.005–1.030)
pH: 6 (ref 5.0–8.0)

## 2018-01-12 LAB — GLUCOSE, CAPILLARY: Glucose-Capillary: 142 mg/dL — ABNORMAL HIGH (ref 65–99)

## 2018-01-12 LAB — CBC WITH DIFFERENTIAL/PLATELET
Abs Immature Granulocytes: 0 10*3/uL (ref 0.0–0.1)
Basophils Absolute: 0.1 10*3/uL (ref 0.0–0.1)
Basophils Relative: 1 %
Eosinophils Absolute: 0.3 10*3/uL (ref 0.0–0.7)
Eosinophils Relative: 4 %
HCT: 28.3 % — ABNORMAL LOW (ref 36.0–46.0)
Hemoglobin: 9.8 g/dL — ABNORMAL LOW (ref 12.0–15.0)
Immature Granulocytes: 0 %
Lymphocytes Relative: 16 %
Lymphs Abs: 1.2 10*3/uL (ref 0.7–4.0)
MCH: 33.1 pg (ref 26.0–34.0)
MCHC: 34.6 g/dL (ref 30.0–36.0)
MCV: 95.6 fL (ref 78.0–100.0)
Monocytes Absolute: 0.6 10*3/uL (ref 0.1–1.0)
Monocytes Relative: 8 %
Neutro Abs: 5.2 10*3/uL (ref 1.7–7.7)
Neutrophils Relative %: 71 %
Platelets: 157 10*3/uL (ref 150–400)
RBC: 2.96 MIL/uL — ABNORMAL LOW (ref 3.87–5.11)
RDW: 12.7 % (ref 11.5–15.5)
WBC: 7.3 10*3/uL (ref 4.0–10.5)

## 2018-01-12 LAB — COMPREHENSIVE METABOLIC PANEL
ALT: 33 U/L (ref 14–54)
AST: 33 U/L (ref 15–41)
Albumin: 2.8 g/dL — ABNORMAL LOW (ref 3.5–5.0)
Alkaline Phosphatase: 67 U/L (ref 38–126)
Anion gap: 8 (ref 5–15)
BUN: 30 mg/dL — ABNORMAL HIGH (ref 6–20)
CO2: 22 mmol/L (ref 22–32)
Calcium: 9.1 mg/dL (ref 8.9–10.3)
Chloride: 110 mmol/L (ref 101–111)
Creatinine, Ser: 1.82 mg/dL — ABNORMAL HIGH (ref 0.44–1.00)
GFR calc Af Amer: 32 mL/min — ABNORMAL LOW (ref >60)
GFR calc non Af Amer: 28 mL/min — ABNORMAL LOW (ref >60)
Glucose, Bld: 248 mg/dL — ABNORMAL HIGH (ref 65–99)
Potassium: 4.6 mmol/L (ref 3.5–5.1)
Sodium: 140 mmol/L (ref 135–145)
Total Bilirubin: 0.7 mg/dL (ref 0.3–1.2)
Total Protein: 5.7 g/dL — ABNORMAL LOW (ref 6.5–8.1)

## 2018-01-12 LAB — MRSA PCR SCREENING: MRSA by PCR: NEGATIVE

## 2018-01-12 LAB — BRAIN NATRIURETIC PEPTIDE: B Natriuretic Peptide: 145.3 pg/mL — ABNORMAL HIGH (ref 0.0–100.0)

## 2018-01-12 LAB — PROTIME-INR
INR: 0.99
Prothrombin Time: 13 seconds (ref 11.4–15.2)

## 2018-01-12 LAB — I-STAT TROPONIN, ED: Troponin i, poc: 0.02 ng/mL (ref 0.00–0.08)

## 2018-01-12 LAB — I-STAT CHEM 8, ED
BUN: 34 mg/dL — ABNORMAL HIGH (ref 6–20)
Calcium, Ion: 1.21 mmol/L (ref 1.15–1.40)
Chloride: 107 mmol/L (ref 101–111)
Creatinine, Ser: 1.7 mg/dL — ABNORMAL HIGH (ref 0.44–1.00)
Glucose, Bld: 242 mg/dL — ABNORMAL HIGH (ref 65–99)
HCT: 24 % — ABNORMAL LOW (ref 36.0–46.0)
Hemoglobin: 8.2 g/dL — ABNORMAL LOW (ref 12.0–15.0)
Potassium: 4.5 mmol/L (ref 3.5–5.1)
Sodium: 140 mmol/L (ref 135–145)
TCO2: 23 mmol/L (ref 22–32)

## 2018-01-12 LAB — ECHOCARDIOGRAM COMPLETE

## 2018-01-12 LAB — TROPONIN I: Troponin I: <0.03 ng/mL (ref <0.03)

## 2018-01-12 LAB — HEMOGLOBIN A1C
Hgb A1c MFr Bld: 6.8 % — ABNORMAL HIGH (ref 4.8–5.6)
Mean Plasma Glucose: 148.46 mg/dL

## 2018-01-12 LAB — LIPASE, BLOOD: Lipase: 25 U/L (ref 11–51)

## 2018-01-12 LAB — D-DIMER, QUANTITATIVE: D-Dimer, Quant: 2.32 ug/mL-FEU — ABNORMAL HIGH (ref 0.00–0.50)

## 2018-01-12 MED ORDER — MORPHINE SULFATE (PF) 4 MG/ML IV SOLN
2.0000 mg | Freq: Once | INTRAVENOUS | Status: AC
  Administered 2018-01-12: 2 mg via INTRAVENOUS
  Filled 2018-01-12: qty 1

## 2018-01-12 MED ORDER — MONTELUKAST SODIUM 10 MG PO TABS
10.0000 mg | ORAL_TABLET | Freq: Every day | ORAL | Status: DC
Start: 2018-01-12 — End: 2018-01-14
  Administered 2018-01-12 – 2018-01-13 (×2): 10 mg via ORAL
  Filled 2018-01-12 (×2): qty 1

## 2018-01-12 MED ORDER — PRAZOSIN HCL 2 MG PO CAPS
2.0000 mg | ORAL_CAPSULE | Freq: Every day | ORAL | Status: DC
Start: 2018-01-12 — End: 2018-01-14
  Administered 2018-01-12 – 2018-01-13 (×2): 2 mg via ORAL
  Filled 2018-01-12 (×2): qty 1

## 2018-01-12 MED ORDER — ACETAMINOPHEN 325 MG PO TABS: 650.0000 mg | ORAL_TABLET | Freq: Four times a day (QID) | ORAL | Status: DC | PRN

## 2018-01-12 MED ORDER — PANTOPRAZOLE SODIUM 40 MG PO TBEC
40.0000 mg | DELAYED_RELEASE_TABLET | Freq: Every day | ORAL | Status: DC
  Administered 2018-01-13: 40 mg via ORAL
  Filled 2018-01-12: qty 1

## 2018-01-12 MED ORDER — ASPIRIN EC 81 MG PO TBEC
81.0000 mg | DELAYED_RELEASE_TABLET | Freq: Every day | ORAL | Status: DC
  Administered 2018-01-13: 81 mg via ORAL
  Filled 2018-01-12: qty 1

## 2018-01-12 MED ORDER — CARVEDILOL 25 MG PO TABS
25.0000 mg | ORAL_TABLET | Freq: Two times a day (BID) | ORAL | Status: DC
  Administered 2018-01-12 – 2018-01-13 (×3): 25 mg via ORAL
  Filled 2018-01-12 (×3): qty 1

## 2018-01-12 MED ORDER — ALBUTEROL SULFATE (2.5 MG/3ML) 0.083% IN NEBU: 2.5000 mg | INHALATION_SOLUTION | Freq: Four times a day (QID) | RESPIRATORY_TRACT | Status: DC | PRN

## 2018-01-12 MED ORDER — ACETAMINOPHEN 650 MG RE SUPP: 650.0000 mg | Freq: Four times a day (QID) | RECTAL | Status: DC | PRN

## 2018-01-12 MED ORDER — VITAMIN B-6 100 MG PO TABS
200.0000 mg | ORAL_TABLET | Freq: Two times a day (BID) | ORAL | Status: DC
  Administered 2018-01-12 – 2018-01-13 (×3): 200 mg via ORAL
  Filled 2018-01-12 (×3): qty 2

## 2018-01-12 MED ORDER — ENOXAPARIN SODIUM 40 MG/0.4ML ~~LOC~~ SOLN
40.0000 mg | SUBCUTANEOUS | Status: DC
  Administered 2018-01-12 – 2018-01-13 (×2): 40 mg via SUBCUTANEOUS
  Filled 2018-01-12 (×2): qty 0.4

## 2018-01-12 MED ORDER — CARVEDILOL 25 MG PO TABS: 25.0000 mg | ORAL_TABLET | Freq: Two times a day (BID) | ORAL | Status: DC

## 2018-01-12 MED ORDER — ONDANSETRON HCL 4 MG PO TABS
4.0000 mg | ORAL_TABLET | Freq: Four times a day (QID) | ORAL | Status: DC | PRN
Start: 2018-01-12 — End: 2018-01-14

## 2018-01-12 MED ORDER — DULOXETINE HCL 60 MG PO CPEP
60.0000 mg | ORAL_CAPSULE | Freq: Every day | ORAL | Status: DC
  Administered 2018-01-13: 60 mg via ORAL
  Filled 2018-01-12: qty 1

## 2018-01-12 MED ORDER — PROPYLENE GLYCOL 0.6 % OP SOLN: Freq: Two times a day (BID) | OPHTHALMIC | Status: DC

## 2018-01-12 MED ORDER — TRAZODONE HCL 50 MG PO TABS
100.0000 mg | ORAL_TABLET | Freq: Every day | ORAL | Status: DC
  Administered 2018-01-12 – 2018-01-13 (×2): 100 mg via ORAL
  Filled 2018-01-12 (×2): qty 2

## 2018-01-12 MED ORDER — RISPERIDONE 0.5 MG PO TABS
0.5000 mg | ORAL_TABLET | Freq: Two times a day (BID) | ORAL | Status: DC
Start: 2018-01-12 — End: 2018-01-14
  Administered 2018-01-12 – 2018-01-13 (×3): 0.5 mg via ORAL
  Filled 2018-01-12 (×3): qty 1

## 2018-01-12 MED ORDER — PNEUMOCOCCAL VAC POLYVALENT 25 MCG/0.5ML IJ INJ
0.5000 mL | INJECTION | INTRAMUSCULAR | Status: AC
  Administered 2018-01-13: 0.5 mL via INTRAMUSCULAR

## 2018-01-12 MED ORDER — IOPAMIDOL (ISOVUE-370) INJECTION 76%
100.0000 mL | Freq: Once | INTRAVENOUS | Status: AC | PRN
  Administered 2018-01-12: 100 mL via INTRAVENOUS

## 2018-01-12 MED ORDER — HYDRALAZINE HCL 50 MG PO TABS
50.0000 mg | ORAL_TABLET | Freq: Three times a day (TID) | ORAL | Status: DC
  Administered 2018-01-12 – 2018-01-13 (×4): 50 mg via ORAL
  Filled 2018-01-12 (×4): qty 1

## 2018-01-12 MED ORDER — PERFLUTREN LIPID MICROSPHERE
1.0000 mL | INTRAVENOUS | Status: AC | PRN
  Administered 2018-01-12: 2 mL via INTRAVENOUS
  Filled 2018-01-12: qty 10

## 2018-01-12 MED ORDER — POLYETHYLENE GLYCOL 3350 17 G PO PACK: 17.0000 g | PACK | Freq: Every day | ORAL | Status: DC | PRN

## 2018-01-12 MED ORDER — ONDANSETRON HCL 4 MG/2ML IJ SOLN: 4.0000 mg | Freq: Four times a day (QID) | INTRAMUSCULAR | Status: DC | PRN

## 2018-01-12 MED ORDER — POLYVINYL ALCOHOL 1.4 % OP SOLN
1.0000 [drp] | OPHTHALMIC | Status: DC | PRN
  Administered 2018-01-12: 1 [drp] via OPHTHALMIC
  Filled 2018-01-12: qty 15

## 2018-01-12 MED ORDER — HYDRALAZINE HCL 50 MG PO TABS: 50.0000 mg | ORAL_TABLET | Freq: Three times a day (TID) | ORAL | Status: DC

## 2018-01-12 MED ORDER — INSULIN ASPART 100 UNIT/ML ~~LOC~~ SOLN
0.0000 [IU] | Freq: Every day | SUBCUTANEOUS | Status: DC
  Administered 2018-01-13: 3 [IU] via SUBCUTANEOUS

## 2018-01-12 MED ORDER — HYDRALAZINE HCL 20 MG/ML IJ SOLN
20.0000 mg | Freq: Once | INTRAMUSCULAR | Status: AC
  Administered 2018-01-12: 20 mg via INTRAVENOUS
  Filled 2018-01-12: qty 1

## 2018-01-12 MED ORDER — LEVOTHYROXINE SODIUM 25 MCG PO TABS
25.0000 ug | ORAL_TABLET | Freq: Every day | ORAL | Status: DC
  Administered 2018-01-13: 25 ug via ORAL
  Filled 2018-01-12: qty 1

## 2018-01-12 MED ORDER — LABETALOL HCL 5 MG/ML IV SOLN
10.0000 mg | Freq: Once | INTRAVENOUS | Status: AC
  Administered 2018-01-12: 10 mg via INTRAVENOUS
  Filled 2018-01-12: qty 4

## 2018-01-12 MED ORDER — ATORVASTATIN CALCIUM 40 MG PO TABS
40.0000 mg | ORAL_TABLET | Freq: Every day | ORAL | Status: DC
  Administered 2018-01-12 – 2018-01-13 (×2): 40 mg via ORAL
  Filled 2018-01-12 (×2): qty 1

## 2018-01-12 MED ORDER — SODIUM CHLORIDE 0.9 % IV SOLN
INTRAVENOUS | Status: AC
  Administered 2018-01-12: 17:00:00 via INTRAVENOUS

## 2018-01-12 MED ORDER — GABAPENTIN 300 MG PO CAPS
300.0000 mg | ORAL_CAPSULE | Freq: Two times a day (BID) | ORAL | Status: DC
  Administered 2018-01-12 – 2018-01-13 (×3): 300 mg via ORAL
  Filled 2018-01-12 (×3): qty 1

## 2018-01-12 MED ORDER — NITROGLYCERIN 2 % TD OINT
1.0000 [in_us] | TOPICAL_OINTMENT | Freq: Four times a day (QID) | TRANSDERMAL | Status: DC
  Administered 2018-01-12 – 2018-01-13 (×5): 1 [in_us] via TOPICAL
  Filled 2018-01-12: qty 30
  Filled 2018-01-12: qty 1

## 2018-01-12 MED ORDER — IOPAMIDOL (ISOVUE-370) INJECTION 76%
INTRAVENOUS | Status: AC
  Filled 2018-01-12: qty 100

## 2018-01-12 MED ORDER — INSULIN ASPART 100 UNIT/ML ~~LOC~~ SOLN
0.0000 [IU] | Freq: Three times a day (TID) | SUBCUTANEOUS | Status: DC
  Administered 2018-01-13 (×3): 3 [IU] via SUBCUTANEOUS

## 2018-01-12 MED ORDER — MOMETASONE FURO-FORMOTEROL FUM 200-5 MCG/ACT IN AERO
2.0000 | INHALATION_SPRAY | Freq: Two times a day (BID) | RESPIRATORY_TRACT | Status: DC
  Administered 2018-01-13 (×2): 2 via RESPIRATORY_TRACT
  Filled 2018-01-12: qty 8.8

## 2018-01-12 NOTE — H&P (Addendum)
Date: 01/12/2018               Patient Name:  [name] MRN: [id number]  DOB: August 25, 1950 Age / Sex: 67 y.o., female   PCP: Jessee Avers, FNP              Medical Service: Internal Medicine Teaching Service              Attending Physician: Dr. Rebeca Alert Raynaldo Opitz, MD    First Contact: Orlene Erm, MS 4 Pager: [phone]  Second Contact: Dr. Hetty Ely Pager: [phone]            After Hours (After 5p/  First Contact Pager: [phone]  weekends / holidays): Second Contact Pager: [phone]   Chief Complaint: "It feels like someone put a knife between my shoulder blades"  History of Present Illness: [name] is a 67 y.o. female with hx of HFpEF, CKD3, DM2, proteinuria, HTN, metabolic bone disease, anemia, schizophrenia, PE (April 2010), acute pericarditis (May 2010), GERD with Barrett's Esophagus (EGD 8/07), Asthma (PFT's 10/14/06), rheumatoid arthritis, and hypothyroidism presenting with 12 hours of sharp left-sided chest pain radiating to her back. She began experiencing sharp pain around 0330 on 01/12/18. She has had chest pain before with CHF exacerbations, but nothing like this. It woke her up from sleep. It is worst when she takes a deep breath. Morphine partially alleviates it, but only for about 30 mins.  Of note, she did suffer a fall approximately 1 week ago at her assisted living facility [facility]), and still has a black eye and some left rib and flank pain from it.  Meds: Current Facility-Administered Medications  Medication Dose Route Frequency Provider Last Rate Last Dose  . 0.9 %  sodium chloride infusion   Intravenous Continuous Lorella Nimrod, MD 200 mL/hr at 01/12/18 1635    . acetaminophen (TYLENOL) tablet 650 mg  650 mg Oral Q6H PRN Lorella Nimrod, MD       Or  . acetaminophen (TYLENOL) suppository 650 mg  650 mg Rectal Q6H PRN Lorella Nimrod, MD      . iopamidol (ISOVUE-370) 76 % injection           . nitroGLYCERIN (NITROGLYN) 2 % ointment 1 inch  1 inch  Topical Q6H Charlesetta Shanks, MD   1 inch at 01/12/18 1300  . ondansetron (ZOFRAN) tablet 4 mg  4 mg Oral Q6H PRN Lorella Nimrod, MD       Or  . ondansetron (ZOFRAN) injection 4 mg  4 mg Intravenous Q6H PRN Lorella Nimrod, MD      . polyethylene glycol (MIRALAX / GLYCOLAX) packet 17 g  17 g Oral Daily PRN Lorella Nimrod, MD        Allergies: Allergies as of 01/12/2018 - Review Complete 01/12/2018  Allergen Reaction Noted  . Benztropine mesylate Other (See Comments) 12/19/2008  . Codeine Other (See Comments)   . Diazepam Other (See Comments) 12/19/2008  . Diphenhydramine hcl Other (See Comments) 09/23/2009  . Penicillins Other (See Comments) 12/19/2008  . Sulfonamide derivatives Other (See Comments) 12/19/2008  . Thioridazine hcl Other (See Comments) 07/25/2009  . Lisinopril Other (See Comments) 04/22/2009   Past Medical History:  Diagnosis Date  . Anxiety   . Asthma   . Back pain   . Barrett esophagus   . CKD (chronic kidney disease) stage 3, GFR 30-59 ml/min (HCC)   . Depression   . Diastolic heart failure ([place])   . Essential hypertension   . Fibromyalgia   .  GERD (gastroesophageal reflux disease)   . Gout   . History of pericarditis   . Hyperlipidemia   . Nephrolithiasis    2008  . Obesity hypoventilation syndrome (HCC)    CPAP  . PE (pulmonary embolism)    2010  . Peripheral neuropathy   . Rheumatoid arthritis ([place])   . Schizophrenia ([place])   . Steroid dependence   . Type 2 diabetes mellitus ([place])   . Vitamin D deficiency    Past Surgical History:  Procedure Laterality Date  . APPENDECTOMY    . CHOLECYSTECTOMY    . COLONOSCOPY     benign polyps (12/2000), repeat colonoscopy performed in 2007  . ENDOMETRIAL BIOPSY     10/03 benign columnar mucosa (limited material)  . KIDNEY SURGERY     Family History  Problem Relation Age of Onset  . Bone cancer Mother   . Hypertension Mother   . Heart disease Mother   . COPD Father   . Heart disease Father   . Stroke  Father    Social History   Socioeconomic History  . Marital status: Single    Spouse name: Not on file  . Number of children: Not on file  . Years of education: Not on file  . Highest education level: Not on file  Occupational History  . Not on file  Social Needs  . Financial resource strain: Not on file  . Food insecurity:    Worry: Not on file    Inability: Not on file  . Transportation needs:    Medical: Not on file    Non-medical: Not on file  Tobacco Use  . Smoking status: Former Smoker    Types: Cigarettes    Last attempt to quit: 06/23/1972    Years since quitting: 45.5  . Smokeless tobacco: Never Used  Substance and Sexual Activity  . Alcohol use: No  . Drug use: No  . Sexual activity: Never  Lifestyle  . Physical activity:    Days per week: Not on file    Minutes per session: Not on file  . Stress: Not on file  Relationships  . Social connections:    Talks on phone: Not on file    Gets together: Not on file    Attends religious service: Not on file    Active member of club or organization: Not on file    Attends meetings of clubs or organizations: Not on file    Relationship status: Not on file  . Intimate partner violence:    Fear of current or ex partner: Not on file    Emotionally abused: Not on file    Physically abused: Not on file    Forced sexual activity: Not on file  Other Topics Concern  . Not on file  Social History Narrative  . Not on file    Review of Systems: Pertinent items noted in HPI and remainder of comprehensive ROS otherwise negative.  Physical Exam: Blood pressure (!) 165/74, pulse 72, temperature (!) 97.5 F (36.4 C), temperature source Oral, resp. rate (!) 24, SpO2 99 %. BP (!) 212/75 (BP Location: Right Arm)   Pulse 66   Temp 97.6 F (36.4 C) (Oral)   Resp 18   SpO2 99%  General appearance: alert, cooperative, appears stated age, moderately obese and appears afraid. Wincing in pain at times Head: periorbital  ecchymosis on left Lungs: clear to auscultation bilaterally and no w/r/r Heart: regular rate and rhythm, S1, S2 normal and systolic murmur:  systolic ejection 1/6, crescendo at 2nd right intercostal space Abdomen: soft, non-tender; bowel sounds normal; no masses,  no organomegaly Extremities: extremities normal, atraumatic, no cyanosis or edema, edema 1+ pitting edema and healing ulcer on left foot Pulses: 2+ and symmetric Skin: erythematous patches in intertrigenous zones of abdomen and groin consistent with candidal intertrigo Neurologic: Grossly normal  Psych: Poor eye contact. Appears appropriately afraid. Mental status grossly intact but not formally assessed  Lab results: CBC    Component Value Date/Time   WBC 7.3 01/12/2018 1056   RBC 2.96 (L) 01/12/2018 1056   HGB 8.2 (L) 01/12/2018 1107   HCT 24.0 (L) 01/12/2018 1107   PLT 157 01/12/2018 1056   MCV 95.6 01/12/2018 1056   MCH 33.1 01/12/2018 1056   MCHC 34.6 01/12/2018 1056   RDW 12.7 01/12/2018 1056   LYMPHSABS 1.2 01/12/2018 1056   MONOABS 0.6 01/12/2018 1056   EOSABS 0.3 01/12/2018 1056   BASOSABS 0.1 01/12/2018 1056   CMP Latest Ref Rng & Units 01/12/2018 01/12/2018 10/13/2016  Glucose 65 - 99 mg/dL 242(H) 248(H) 154(H)  BUN 6 - 20 mg/dL 34(H) 30(H) 24(H)  Creatinine 0.44 - 1.00 mg/dL 1.70(H) 1.82(H) 1.62(H)  Sodium 135 - 145 mmol/L 140 140 140  Potassium 3.5 - 5.1 mmol/L 4.5 4.6 3.8  Chloride 101 - 111 mmol/L 107 110 108  CO2 22 - 32 mmol/L - 22 24  Calcium 8.9 - 10.3 mg/dL - 9.1 9.1  Total Protein 6.5 - 8.1 g/dL - 5.7(L) -  Total Bilirubin 0.3 - 1.2 mg/dL - 0.7 -  Alkaline Phos 38 - 126 U/L - 67 -  AST 15 - 41 U/L - 33 -  ALT 14 - 54 U/L - 33 -   Troponin (Point of Care Test) Recent Labs    01/12/18 1105  TROPIPOC 0.02    Imaging results:  Dg Chest 2 View  Result Date: 01/12/2018 CLINICAL DATA:  Chest pain EXAM: CHEST - 2 VIEW COMPARISON:  Jan 12, 2017 FINDINGS: Stable cardiomegaly. The hila and  mediastinum are normal. No pulmonary nodules, masses, or focal infiltrates. No overt edema. IMPRESSION: No active cardiopulmonary disease. Electronically Signed   By: Dorise Bullion III M.D   On: 01/12/2018 16:13   Dg Chest 2 View  Result Date: 01/12/2018 CLINICAL DATA:  Chest pain EXAM: CHEST - 2 VIEW COMPARISON:  10/06/2017 FINDINGS: Cardiac enlargement without heart failure. Hypoventilation with decreased lung volume. Negative for pneumonia or effusion. IMPRESSION: No active cardiopulmonary disease. Electronically Signed   By: Franchot Gallo M.D.   On: 01/12/2018 11:28   Ct Head Wo Contrast  Result Date: 01/12/2018 CLINICAL DATA:  Altered mental status EXAM: CT HEAD WITHOUT CONTRAST TECHNIQUE: Contiguous axial images were obtained from the base of the skull through the vertex without intravenous contrast. COMPARISON:  January 22, 2017 FINDINGS: Brain: Mild atrophy is stable. There is no intracranial mass, hemorrhage, extra-axial fluid collection, or midline shift. There is mild small Cross disease in the centra semiovale bilaterally. Elsewhere gray-white compartments appear normal. No acute infarct is demonstrable. Vascular: No appreciable hyperdense Cross. There is calcification in each carotid siphon region. Skull: Bony calvarium appears intact. Sinuses/Orbits: There are retention cysts in each inferior maxillary antrum. There is mucosal thickening in several ethmoid air cells bilaterally. Frontal sinuses are aplastic. Orbits appear symmetric bilaterally. Other: Mastoid air cells are clear. IMPRESSION: Stable atrophy with mild periventricular small Cross disease. No evident acute infarct. No mass or hemorrhage. Foci of arterial vascular calcification noted. There  are areas of paranasal sinus disease. Electronically Signed   By: Lowella Grip III M.D.   On: 01/12/2018 12:35   Ct Angio Chest/abd/pel For Dissection W And/or W/wo  Result Date: 01/12/2018 CLINICAL DATA:  Chest pain, AAS suspected,  hemodynamically stable, complaint of chest pain that started last night, pain resided and then started again this morning. EXAM: CT ANGIOGRAPHY CHEST, ABDOMEN AND PELVIS TECHNIQUE: Multidetector CT imaging through the chest, abdomen and pelvis was performed using the standard protocol during bolus administration of intravenous contrast. Multiplanar reconstructed images and MIPs were obtained and reviewed to evaluate the vascular anatomy. CONTRAST:  80 mL ISOVUE-370 IOPAMIDOL (ISOVUE-370) INJECTION 76% COMPARISON:  CT abdomen and pelvis, 08/25/2017. CTA chest, 10/12/2016. FINDINGS: CTA CHEST FINDINGS Cardiovascular: Heart is mildly enlarged. No pericardial effusion. Mild three-Cross coronary artery calcifications. Aorta is normal in caliber. No aortic dissection. Minor atherosclerosis noted along the descending arch. Pulmonary arteries are normal in caliber. No large or central pulmonary embolus. Mediastinum/Nodes: No enlarged mediastinal, hilar, or axillary lymph nodes. Thyroid gland, trachea, and esophagus demonstrate no significant findings. Lungs/Pleura: Minor dependent subsegmental atelectasis. No evidence of pneumonia or pulmonary edema. No lung mass or suspicious nodule. No pleural effusion or pneumothorax. Musculoskeletal: No fracture or acute finding. No osteoblastic or osteolytic lesions. Review of the MIP images confirms the above findings. CTA ABDOMEN AND PELVIS FINDINGS VASCULAR Aorta: Normal in caliber. No dissection. Minor atherosclerotic plaque. Celiac: Minor partially calcified plaque at its origin. Noncalcified plaque noted just beyond the origin, narrowing estimated at 60%. SMA: Patent without evidence of aneurysm, dissection, vasculitis or significant stenosis. Renals: Mild partly calcified plaque noted at the origin of both renal arteries. No significant stenosis. IMA: Patent without evidence of aneurysm, dissection, vasculitis or significant stenosis. Inflow: Mild plaque noted of both common  iliac arteries without significant stenosis. External iliac arteries are widely patent. Veins: No obvious venous abnormality within the limitations of this arterial phase study. Review of the MIP images confirms the above findings. NON-VASCULAR Hepatobiliary: No focal liver abnormality is seen. Status post cholecystectomy. No biliary dilatation. Pancreas: Unremarkable. No pancreatic ductal dilatation or surrounding inflammatory changes. Spleen: Normal in size without focal abnormality. Adrenals/Urinary Tract: No adrenal masses. Bilateral renal cortical thinning. No masses or stones. No hydronephrosis. Ureters are normal course and caliber. Bladder is unremarkable. Stomach/Bowel: Small hiatal hernia. Stomach otherwise unremarkable. Normal small bowel. Several colonic diverticula noted. No diverticulitis or other colon abnormality. Lymphatic: No pathologically enlarged lymph nodes. Reproductive: Uterus and bilateral adnexa are unremarkable. Other: Small fat containing hernia, anterior lower abdominal midline adjacent to the umbilicus. This is stable from the prior CT. No other hernia. No ascites. Musculoskeletal: No fracture or acute finding. No osteoblastic or osteolytic lesions. Degenerative changes of the visualized spine stable from the prior CT. Review of the MIP images confirms the above findings. IMPRESSION: CTA FINDINGS 1. No dissection of the thoracoabdominal aorta.  No aneurysm. 2. Minor atherosclerotic changes of the thoracic and abdominal aorta. 3. Focal stenosis just beyond the origin of the celiac axis estimated 60% narrowing. No other significant stenosis. NON CTA FINDINGS 1. No acute abnormalities within the chest. 2. Mild cardiomegaly.  Mild coronary artery calcifications. 3. No acute abnormalities within the abdomen or pelvis. 4. Bilateral renal cortical thinning. 5. Small hiatal hernia. Electronically Signed   By: Lajean Manes M.D.   On: 01/12/2018 17:39    Other results: EKG: normal sinus  rhythm, RBBB unchanged from prior  Assessment & Plan by Problem: Active Problems:  Chest pain [name] is a 67 y.o. female with hx of HFpEF, CKD3, DM2, proteinuria, HTN, metabolic bone disease, anemia, schizophrenia, PE (April 2010), acute pericarditis (May 2010), GERD with Barrett's Esophagus (EGD 8/07), Asthma (PFT's 10/14/06), rheumatoid arthritis, and hypothyroidism presenting with 12 hours of sharp left-sided chest pain radiating to her back.  Chest Pain The etiology of her chest pain is not yet clear. New-onset chest pain localizing between the scapula is certainly concerning for aortic dissection. However, CTA did not show this. Pleuritic nature of chest pain is also concerning for PE or pericarditis. She does have a history of suspected pericarditis in 2010, and this is a likely possibility. PE is possible given a Geneva score of 4 and a D-Dimer of 2.32. MI seems unlikely at this point given the absence of ECG changes or troponin elevation. History of metabolic bone disease is certainly concerning for spinal fracture. However, this was not seen on her CT scan. Given her recent fall, metabolic bone disease, and history of RA, MSK issues must remain on the differential as well. - Repeat troponin q6h x3 - Low threshold to repeat ECG - S/p ASA 325. Now 81 mg ASA QD - F/u echo - Nitro q6h prn  CKD Cr 1.46 on 10/11/17. Cr 1.82 on admission, which would correspond to CKD4 if this is her new baseline. The team took every possible measure to avoid IV contrast given her CKD, including obtaining an echocardiogram and D-dimer first. However, the high suspicion for aortic dissection rendered the benefits of the procedure higher than the risks. She may also develop contrast-induced nephropathy over the next several days due to the high contrast load from her CTA. - 1L peri-contrast IVF  DM2 A1c was 5.6% on 10/13/26. - Repeat A1c - SSI  Asthma - Albuterol 2.5 mg q6h PRN - Dulera 200-5 BID -  Singulair 10 QD  HLD - Atorvastatin 40 QD  HTN Systolics in 161'W in ED. She reports excellent compliance with her home BP regimen, since she is at an ALF that controls her meds for her. - Hydralazine 50 mg PO q8h  Schizophrenia / Depression Follows with Dr. Mirna Mires at [facility]. Mental status does not appear altered at this time. - Duloxetine 60 mg QD - Gabapentin 300 BID - Prazosin 2 mg QD - Risperidone 0.5 mg BID - Trazodone 100 mg QHS  Hypothyroidism - Synthroid 25 mg QD  GERD with Barrett's Esophagus - Protonix 40 mg QD  VTE ppx: Lovenox GI ppx: Miralax Dispo: Admit to telemetry. Expected stay >2 midnights. Code status: Full code  This is a Careers information officer Note.  The care of the patient was discussed with Dr. Reesa Chew and the assessment and plan was formulated with their assistance.  Please see their note for official documentation of the patient encounter.   Signed: Doristine Johns, Medical Student 01/12/2018, 6:14 PM   Attestation for Student Documentation:  I personally was present and performed or re-performed the history, physical exam and medical decision-making activities of this service and have verified that the service and findings are accurately documented in the student's note.  Lorella Nimrod, MD 01/12/2018, 8:20 PM

## 2018-01-12 NOTE — ED Provider Notes (Signed)
[facility] EMERGENCY DEPARTMENT Provider Note   CSN: [id number] Arrival date & time: 01/12/18  1038     History   Chief Complaint Chief Complaint  Patient presents with  . Chest Pain    HPI [name] is a 67 y.o. female.  HPI Patient is [facility].  She reports that she developed chest pain during the night.  She indicates this on the left lower and mid chest.  She reports is been going into her back.  It has been going on most of the night.  She reports she has felt short of breath as well.  No recent fever or cough.  She reports that she is got swelling in her legs but that is been going on for a while.  She denies that she has pain behind her calves.  I perceive that the patient has bruising on her face and a right-sided mouth  Droop.  She states that she fell last week.  She denies that she has had a stroke that she is aware of.  She reports sometimes her right arm is harder to use when it is swollen.  She reports lately she has not been able to pick things up with it because of swelling.  She denies any awareness of prior stroke.  Patient does appear to be physically debilitated at baseline. Past Medical History:  Diagnosis Date  . Anxiety   . Asthma   . Back pain   . Barrett esophagus   . CKD (chronic kidney disease) stage 3, GFR 30-59 ml/min (HCC)   . Depression   . Diastolic heart failure ([place])   . Essential hypertension   . Fibromyalgia   . GERD (gastroesophageal reflux disease)   . Gout   . History of pericarditis   . Hyperlipidemia   . Nephrolithiasis    2008  . Obesity hypoventilation syndrome (HCC)    CPAP  . PE (pulmonary embolism)    2010  . Peripheral neuropathy   . Rheumatoid arthritis ([place])   . Schizophrenia ([place])   . Steroid dependence   . Type 2 diabetes mellitus ([place])   . Vitamin D deficiency     Patient Active Problem List   Diagnosis Date Noted  . Altered mental status   .  Encephalopathy 10/11/2016  . Chest pain 08/12/2016  . CKD (chronic kidney disease) stage 3, GFR 30-59 ml/min (HCC) 08/12/2016  . History of pulmonary embolus (PE) 08/12/2016  . Obesity hypoventilation syndrome ([place]) 08/12/2016  . RBBB 08/12/2016  . Positive D dimer 08/12/2016  . CVA (cerebral infarction) 11/10/2015  . Physical deconditioning 07/01/2011  . Weakness 07/01/2011  . Diabetic foot ulcer ([place]) 05/19/2011  . Polypharmacy 02/09/2011  . BACK PAIN WITH RADICULOPATHY 05/25/2010  . Type 2 diabetes mellitus with diabetic polyneuropathy, without long-term current use of insulin ([place]) 08/18/2009  . Chronic diastolic heart failure ([place]) 04/25/2009  . Anemia 04/14/2009  . Sleep apnea 12/11/2008  . Diabetic peripheral neuropathy ([place]) 05/04/2007  . HYPERCHOLESTEROLEMIA 10/13/2006  . Gout, unspecified 10/13/2006  . HYPOKALEMIA 10/13/2006  . Schizophrenia ([place]) 10/13/2006  . Depression 10/13/2006  . Essential hypertension 10/13/2006  . Venous (peripheral) insufficiency 10/13/2006  . RHINITIS, ALLERGIC 10/13/2006  . Asthma 10/13/2006  . Reflux esophagitis 10/13/2006  . OSTEOARTHRITIS, MULTI SITES 10/13/2006  . INCONTINENCE, URGE 10/13/2006    Past Surgical History:  Procedure Laterality Date  . APPENDECTOMY    . CHOLECYSTECTOMY    . COLONOSCOPY  benign polyps (12/2000), repeat colonoscopy performed in 2007  . ENDOMETRIAL BIOPSY     10/03 benign columnar mucosa (limited material)  . KIDNEY SURGERY       OB History   None      Home Medications    Prior to Admission medications   Medication Sig Start Date End Date Taking? Authorizing Provider  acetaminophen (TYLENOL) 325 MG tablet Take 325 mg by mouth 2 (two) times daily.    Yes [provider]  albuterol (PROVENTIL HFA;VENTOLIN HFA) 108 (90 Base) MCG/ACT inhaler Inhale 2 puffs into the lungs every 6 (six) hours as needed for wheezing or shortness of breath.   Yes [provider]  albuterol  (PROVENTIL) (5 MG/ML) 0.5% nebulizer solution Take 2.5 mg by nebulization every 6 (six) hours as needed for wheezing or shortness of breath.   Yes [provider]  antiseptic oral rinse (BIOTENE) LIQD 15 mLs by Mouth Rinse route daily.   Yes [provider]  aspirin 81 MG tablet Take 81 mg by mouth daily.   Yes [provider]  atorvastatin (LIPITOR) 40 MG tablet Take 40 mg by mouth daily.    Yes [provider]  budesonide-formoterol (SYMBICORT) 160-4.5 MCG/ACT inhaler Inhale 2 puffs into the lungs 2 (two) times daily.   Yes [provider]  carvedilol (COREG) 25 MG tablet Take 25 mg by mouth 2 (two) times daily with a meal.   Yes [provider]  cholecalciferol (VITAMIN D) 1000 units tablet Take 4,000 Units by mouth daily.   Yes [provider]  docusate sodium (COLACE) 100 MG capsule Take 100 mg by mouth daily.    Yes [provider]  DULoxetine (CYMBALTA) 60 MG capsule Take 60 mg by mouth daily.   Yes [provider]  EPINEPHrine (EPI-PEN) 0.3 mg/0.3 mL DEVI Inject 0.3 mg into the muscle once.   Yes [provider]  gabapentin (NEURONTIN) 300 MG capsule Take 300 mg by mouth 2 (two) times daily.  04/29/17  Yes [provider]  hydrocortisone 2.5 % ointment Apply 1 application topically daily as needed (itchy skin).   Yes [provider]  hydrOXYzine (VISTARIL) 25 MG capsule Take 25 mg by mouth 2 (two) times daily as needed for anxiety.    Yes [provider]  ipratropium (ATROVENT) 0.02 % nebulizer solution Take 0.5 mg by nebulization every 6 (six) hours as needed for wheezing or shortness of breath.    Yes [provider]  levothyroxine (SYNTHROID, LEVOTHROID) 25 MCG tablet Take 25 mcg by mouth daily before breakfast.   Yes [provider]  linagliptin (TRADJENTA) 5 MG TABS tablet Take 5 mg by mouth daily.   Yes [provider]  loperamide (IMODIUM) 2 MG  capsule Take 2 mg by mouth 3 (three) times daily as needed for diarrhea or loose stools.   Yes [provider]  LORazepam (ATIVAN) 1 MG tablet Take 1 mg by mouth daily as needed for anxiety.    Yes [provider]  losartan (COZAAR) 50 MG tablet Take 50 mg by mouth daily.  04/29/17  Yes [provider]  montelukast (SINGULAIR) 10 MG tablet Take 10 mg by mouth at bedtime.   Yes [provider]  Multiple Vitamin (DAILY VITE PO) Take 1 tablet by mouth daily.   Yes [provider]  neomycin-polymyxin b-dexamethasone (MAXITROL) 3.5-10000-0.1 [place] 1 application into both eyes 2 (two) times daily.   Yes [provider]  NON FORMULARY  1 each by Other route See admin instructions. CPAP at bedtime. CPAP setting of 15CM of water   Yes [provider]  nystatin cream (MYCOSTATIN) Apply 1 application topically 2 (two) times daily.   Yes [provider]  omeprazole (PRILOSEC) 20 MG capsule Take 20 mg by mouth daily.    Yes [provider]  polyethylene glycol (MIRALAX / GLYCOLAX) packet Take 17 g by mouth daily as needed for mild constipation.  02/07/17  Yes [provider]  prazosin (MINIPRESS) 2 MG capsule Take 2 mg by mouth at bedtime. For PTSD associated nightmares   Yes [provider]  Propylene Glycol (SYSTANE COMPLETE OP) Place 1 drop into both eyes 2 (two) times daily.   Yes [provider]  pyridOXINE (VITAMIN B-6) 100 MG tablet Take 200 mg by mouth 2 (two) times daily.   Yes [provider]  risperiDONE (RISPERDAL) 0.5 MG tablet Take 0.5 mg by mouth 2 (two) times daily.    Yes [provider]  spironolactone (ALDACTONE) 25 MG tablet Take 25 mg by mouth daily.   Yes [provider]  Talc (BABY POWDER EX) Apply 1 application topically daily as needed (for rash).   Yes [provider]  torsemide (DEMADEX) 10 MG tablet Take 10 mg by mouth daily.  04/29/17  Yes  [provider]  traZODone (DESYREL) 100 MG tablet Take 100 mg by mouth at bedtime.   Yes [provider]  doxycycline (VIBRA-TABS) 100 MG tablet Take 1 tablet (100 mg total) by mouth 2 (two) times daily. Patient not taking: Reported on 01/12/2018 11/23/17   Gardiner Barefoot, DPM  mupirocin cream (BACTROBAN) 2 % Apply 1 application topically 2 (two) times daily. Patient not taking: Reported on 01/12/2018 11/22/17   Gardiner Barefoot, DPM    Family History Family History  Problem Relation Age of Onset  . Bone cancer Mother   . Hypertension Mother   . Heart disease Mother   . COPD Father   . Heart disease Father   . Stroke Father     Social History Social History   Tobacco Use  . Smoking status: Former Smoker    Types: Cigarettes    Last attempt to quit: 06/23/1972    Years since quitting: 45.5  . Smokeless tobacco: Never Used  Substance Use Topics  . Alcohol use: No  . Drug use: No     Allergies   Benztropine mesylate; Codeine; Diazepam; Diphenhydramine hcl; Penicillins; Sulfonamide derivatives; Thioridazine hcl; and Lisinopril   Review of Systems Review of Systems 10 Systems reviewed and are negative for acute change except as noted in the HPI.  Physical Exam Updated Vital Signs BP 138/64 (BP Location: Left Arm)   Pulse 80   Temp 98.4 F (36.9 C) (Oral)   Resp (!) 21   Ht 5' (1.524 m)   Wt 100.1 kg (220 lb 10.9 oz)   SpO2 95%   BMI 43.10 kg/m   Physical Exam  Constitutional:  Patient is alert and interactive.  She does not have respiratory distress at rest.  Obesity and physical deconditioning.  HENT:  Violet colored ecchymoses around the right orbit.  Mild edema of the lids but eyes easily open.  Patient appears to have a slight droop at the mouth on the right.  Poor dentition.  Eyes: Pupils are equal, round, and reactive to light. EOM are normal.  Neck: Neck supple.  Cardiovascular: Normal rate, regular rhythm, normal heart sounds and intact  distal pulses.  Pulmonary/Chest:  He does not have respiratory distress at rest.  She does have upper airway expiratory wheeze.  Seems transmitted through the lung fields.  Adequate airflow at the bases.  No significant rales.  Slightly soft at the bases.  Abdominal: Soft. She exhibits no distension. There is no tenderness. There is no guarding.  Musculoskeletal:  2+ edema bilateral lower extremities.  Skin thinning.  It also seems to have mild upper extremity edema bilaterally.  Neurological:  Patient is awake and interactive.  Speech sounds mildly slurred.  Patient has appearance of mouth droop on the right.  She does perform grip strength for both left and right upper extremities.  She has fairly significant general physical deconditioning.  Difficult to assess if there is significant strength differential right to left.  Patient will independently elevate each leg off the bed and temporarily hold.  This seems slightly more difficult for her on the right.  Skin: Skin is warm and dry. There is pallor.  Psychiatric: She has a normal mood and affect.  Patient is cheerful and cooperative     ED Treatments / Results  Labs (all labs ordered are listed, but only abnormal results are displayed) Labs Reviewed  COMPREHENSIVE METABOLIC PANEL - Abnormal; Notable for the following components:      Result Value   Glucose, Bld 248 (*)    BUN 30 (*)    Creatinine, Ser 1.82 (*)    Total Protein 5.7 (*)    Albumin 2.8 (*)    GFR calc non Af Amer 28 (*)    GFR calc Af Amer 32 (*)    All other components within normal limits  BRAIN NATRIURETIC PEPTIDE - Abnormal; Notable for the following components:   B Natriuretic Peptide 145.3 (*)    All other components within normal limits  CBC WITH DIFFERENTIAL/PLATELET - Abnormal; Notable for the following components:   RBC 2.96 (*)    Hemoglobin 9.8 (*)    HCT 28.3 (*)    All other components within normal limits  URINALYSIS, ROUTINE W REFLEX MICROSCOPIC -  Abnormal; Notable for the following components:   APPearance HAZY (*)    Hgb urine dipstick MODERATE (*)    Protein, ur 100 (*)    Leukocytes, UA TRACE (*)    Bacteria, UA MANY (*)    All other components within normal limits  D-DIMER, QUANTITATIVE (NOT AT [facility]) - Abnormal; Notable for the following components:   D-Dimer, Quant 2.32 (*)    All other components within normal limits  BASIC METABOLIC PANEL - Abnormal; Notable for the following components:   CO2 21 (*)    Glucose, Bld 251 (*)    BUN 29 (*)    Creatinine, Ser 1.79 (*)    Calcium 8.5 (*)    GFR calc non Af Amer 28 (*)    GFR calc Af Amer 33 (*)    All other components within normal limits  CBC - Abnormal; Notable for the following components:   RBC 2.93 (*)    Hemoglobin 9.5 (*)    HCT 27.9 (*)    All other components within normal limits  HEMOGLOBIN A1C - Abnormal; Notable for the following components:   Hgb A1c MFr Bld 6.8 (*)    All other components within normal limits  GLUCOSE, CAPILLARY - Abnormal; Notable for the following components:   Glucose-Capillary 142 (*)    All other components within normal limits  I-STAT CHEM 8, ED - Abnormal; Notable for the  following components:   BUN 34 (*)    Creatinine, Ser 1.70 (*)    Glucose, Bld 242 (*)    Hemoglobin 8.2 (*)    HCT 24.0 (*)    All other components within normal limits  MRSA PCR SCREENING  PROTIME-INR  HIV ANTIBODY (ROUTINE TESTING)  TROPONIN I  TROPONIN I  LIPASE, BLOOD  TROPONIN I  I-STAT TROPONIN, ED    EKG EKG Interpretation  Date/Time:  Thursday Jan 12 2018 10:44:24 EDT Ventricular Rate:  69 PR Interval:    QRS Duration: 168 QT Interval:  447 QTC Calculation: 479 R Axis:   -50 Text Interpretation:  Sinus rhythm Borderline short PR interval RBBB and LAFB Left ventricular hypertrophy When compared with ECG of 10/11/2016, No significant change was found Confirmed by Delora Fuel ([id number]) on 01/12/2018 3:58:26 PM   Radiology Dg Chest 2  View  Result Date: 01/12/2018 CLINICAL DATA:  Chest pain EXAM: CHEST - 2 VIEW COMPARISON:  Jan 12, 2017 FINDINGS: Stable cardiomegaly. The hila and mediastinum are normal. No pulmonary nodules, masses, or focal infiltrates. No overt edema. IMPRESSION: No active cardiopulmonary disease. Electronically Signed   By: Dorise Bullion III M.D   On: 01/12/2018 16:13   Dg Chest 2 View  Result Date: 01/12/2018 CLINICAL DATA:  Chest pain EXAM: CHEST - 2 VIEW COMPARISON:  10/06/2017 FINDINGS: Cardiac enlargement without heart failure. Hypoventilation with decreased lung volume. Negative for pneumonia or effusion. IMPRESSION: No active cardiopulmonary disease. Electronically Signed   By: Franchot Gallo M.D.   On: 01/12/2018 11:28   Ct Head Wo Contrast  Result Date: 01/12/2018 CLINICAL DATA:  Altered mental status EXAM: CT HEAD WITHOUT CONTRAST TECHNIQUE: Contiguous axial images were obtained from the base of the skull through the vertex without intravenous contrast. COMPARISON:  January 22, 2017 FINDINGS: Brain: Mild atrophy is stable. There is no intracranial mass, hemorrhage, extra-axial fluid collection, or midline shift. There is mild small vessel disease in the centra semiovale bilaterally. Elsewhere gray-white compartments appear normal. No acute infarct is demonstrable. Vascular: No appreciable hyperdense vessel. There is calcification in each carotid siphon region. Skull: Bony calvarium appears intact. Sinuses/Orbits: There are retention cysts in each inferior maxillary antrum. There is mucosal thickening in several ethmoid air cells bilaterally. Frontal sinuses are aplastic. Orbits appear symmetric bilaterally. Other: Mastoid air cells are clear. IMPRESSION: Stable atrophy with mild periventricular small vessel disease. No evident acute infarct. No mass or hemorrhage. Foci of arterial vascular calcification noted. There are areas of paranasal sinus disease. Electronically Signed   By: Lowella Grip III M.D.    On: 01/12/2018 12:35   Ct Angio Chest/abd/pel For Dissection W And/or W/wo  Result Date: 01/12/2018 CLINICAL DATA:  Chest pain, AAS suspected, hemodynamically stable, complaint of chest pain that started last night, pain resided and then started again this morning. EXAM: CT ANGIOGRAPHY CHEST, ABDOMEN AND PELVIS TECHNIQUE: Multidetector CT imaging through the chest, abdomen and pelvis was performed using the standard protocol during bolus administration of intravenous contrast. Multiplanar reconstructed images and MIPs were obtained and reviewed to evaluate the vascular anatomy. CONTRAST:  80 mL ISOVUE-370 IOPAMIDOL (ISOVUE-370) INJECTION 76% COMPARISON:  CT abdomen and pelvis, 08/25/2017. CTA chest, 10/12/2016. FINDINGS: CTA CHEST FINDINGS Cardiovascular: Heart is mildly enlarged. No pericardial effusion. Mild three-vessel coronary artery calcifications. Aorta is normal in caliber. No aortic dissection. Minor atherosclerosis noted along the descending arch. Pulmonary arteries are normal in caliber. No large or central pulmonary embolus. Mediastinum/Nodes: No enlarged mediastinal, hilar, or axillary  lymph nodes. Thyroid gland, trachea, and esophagus demonstrate no significant findings. Lungs/Pleura: Minor dependent subsegmental atelectasis. No evidence of pneumonia or pulmonary edema. No lung mass or suspicious nodule. No pleural effusion or pneumothorax. Musculoskeletal: No fracture or acute finding. No osteoblastic or osteolytic lesions. Review of the MIP images confirms the above findings. CTA ABDOMEN AND PELVIS FINDINGS VASCULAR Aorta: Normal in caliber. No dissection. Minor atherosclerotic plaque. Celiac: Minor partially calcified plaque at its origin. Noncalcified plaque noted just beyond the origin, narrowing estimated at 60%. SMA: Patent without evidence of aneurysm, dissection, vasculitis or significant stenosis. Renals: Mild partly calcified plaque noted at the origin of both renal arteries. No  significant stenosis. IMA: Patent without evidence of aneurysm, dissection, vasculitis or significant stenosis. Inflow: Mild plaque noted of both common iliac arteries without significant stenosis. External iliac arteries are widely patent. Veins: No obvious venous abnormality within the limitations of this arterial phase study. Review of the MIP images confirms the above findings. NON-VASCULAR Hepatobiliary: No focal liver abnormality is seen. Status post cholecystectomy. No biliary dilatation. Pancreas: Unremarkable. No pancreatic ductal dilatation or surrounding inflammatory changes. Spleen: Normal in size without focal abnormality. Adrenals/Urinary Tract: No adrenal masses. Bilateral renal cortical thinning. No masses or stones. No hydronephrosis. Ureters are normal course and caliber. Bladder is unremarkable. Stomach/Bowel: Small hiatal hernia. Stomach otherwise unremarkable. Normal small bowel. Several colonic diverticula noted. No diverticulitis or other colon abnormality. Lymphatic: No pathologically enlarged lymph nodes. Reproductive: Uterus and bilateral adnexa are unremarkable. Other: Small fat containing hernia, anterior lower abdominal midline adjacent to the umbilicus. This is stable from the prior CT. No other hernia. No ascites. Musculoskeletal: No fracture or acute finding. No osteoblastic or osteolytic lesions. Degenerative changes of the visualized spine stable from the prior CT. Review of the MIP images confirms the above findings. IMPRESSION: CTA FINDINGS 1. No dissection of the thoracoabdominal aorta.  No aneurysm. 2. Minor atherosclerotic changes of the thoracic and abdominal aorta. 3. Focal stenosis just beyond the origin of the celiac axis estimated 60% narrowing. No other significant stenosis. NON CTA FINDINGS 1. No acute abnormalities within the chest. 2. Mild cardiomegaly.  Mild coronary artery calcifications. 3. No acute abnormalities within the abdomen or pelvis. 4. Bilateral renal  cortical thinning. 5. Small hiatal hernia. Electronically Signed   By: Lajean Manes M.D.   On: 01/12/2018 17:39    Procedures Procedures (including critical care time) CRITICAL CARE Performed by: Si Gaul   Total critical care time: 30  minutes  Critical care time was exclusive of separately billable procedures and treating other patients.  Critical care was necessary to treat or prevent imminent or life-threatening deterioration.  Critical care was time spent personally by me on the following activities: development of treatment plan with patient and/or surrogate as well as nursing, discussions with consultants, evaluation of patient's response to treatment, examination of patient, obtaining history from patient or surrogate, ordering and performing treatments and interventions, ordering and review of laboratory studies, ordering and review of radiographic studies, pulse oximetry and re-evaluation of patient's condition. Medications Ordered in ED Medications  nitroGLYCERIN (NITROGLYN) 2 % ointment 1 inch (1 inch Topical Given 01/13/18 0540)  acetaminophen (TYLENOL) tablet 650 mg (has no administration in time range)    Or  acetaminophen (TYLENOL) suppository 650 mg (has no administration in time range)  polyethylene glycol (MIRALAX / GLYCOLAX) packet 17 g (has no administration in time range)  ondansetron (ZOFRAN) tablet 4 mg (has no administration in time range)    Or  ondansetron (ZOFRAN) injection 4 mg (has no administration in time range)  perflutren lipid microspheres (DEFINITY) IV suspension (2 mLs Intravenous Given 01/12/18 1543)  0.9 %  sodium chloride infusion ( Intravenous Stopping Infusion hung by another clincian 01/12/18 2233)  iopamidol (ISOVUE-370) 76 % injection (has no administration in time range)  albuterol (PROVENTIL) (2.5 MG/3ML) 0.083% nebulizer solution 2.5 mg (has no administration in time range)  aspirin EC tablet 81 mg (has no administration in time  range)  atorvastatin (LIPITOR) tablet 40 mg (40 mg Oral Given 01/12/18 2041)  mometasone-formoterol (DULERA) 200-5 MCG/ACT inhaler 2 puff (2 puffs Inhalation Not Given 01/12/18 1958)  DULoxetine (CYMBALTA) DR capsule 60 mg (has no administration in time range)  gabapentin (NEURONTIN) capsule 300 mg (300 mg Oral Given 01/12/18 2155)  levothyroxine (SYNTHROID, LEVOTHROID) tablet 25 mcg (has no administration in time range)  montelukast (SINGULAIR) tablet 10 mg (10 mg Oral Given 01/12/18 2156)  pantoprazole (PROTONIX) EC tablet 40 mg (has no administration in time range)  prazosin (MINIPRESS) capsule 2 mg (2 mg Oral Given 01/12/18 2157)  pyridOXINE (VITAMIN B-6) tablet 200 mg (200 mg Oral Given 01/12/18 2156)  risperiDONE (RISPERDAL) tablet 0.5 mg (0.5 mg Oral Given 01/12/18 2156)  traZODone (DESYREL) tablet 100 mg (100 mg Oral Given 01/12/18 2155)  polyvinyl alcohol (LIQUIFILM TEARS) 1.4 % ophthalmic solution 1 drop (1 drop Both Eyes Given 01/12/18 2155)  insulin aspart (novoLOG) injection 0-9 Units (has no administration in time range)  insulin aspart (novoLOG) injection 0-5 Units (0 Units Subcutaneous Not Given 01/12/18 2156)  pneumococcal 23 valent vaccine (PNU-IMMUNE) injection 0.5 mL (has no administration in time range)  carvedilol (COREG) tablet 25 mg (25 mg Oral Given 01/12/18 1856)  hydrALAZINE (APRESOLINE) tablet 50 mg (50 mg Oral Given 01/13/18 0541)  enoxaparin (LOVENOX) injection 40 mg (40 mg Subcutaneous Given 01/12/18 2041)  hydrALAZINE (APRESOLINE) injection 20 mg (20 mg Intravenous Given 01/12/18 1300)  morphine 4 MG/ML injection 2 mg (2 mg Intravenous Given 01/12/18 1432)  labetalol (NORMODYNE,TRANDATE) injection 10 mg (10 mg Intravenous Given 01/12/18 1444)  labetalol (NORMODYNE,TRANDATE) injection 10 mg (10 mg Intravenous Given 01/12/18 1635)  morphine 4 MG/ML injection 2 mg (2 mg Intravenous Given 01/12/18 1640)  iopamidol (ISOVUE-370) 76 % injection 100 mL (100 mLs Intravenous Contrast  Given 01/12/18 1655)     Initial Impression / Assessment and Plan / ED Course  I have reviewed the triage vital signs and the nursing notes.  Pertinent labs & imaging results that were available during my care of the patient were reviewed by me and considered in my medical decision making (see chart for details).  Clinical Course as of Jan 13 810  Thu Jan 12, 2018  1310 Consult: (13: 10) reviewed with internal medicine teaching service for admission.   [MP]    Clinical Course User Index [MP] Charlesetta Shanks, MD    Consult: Internal medicine residents for admission.  Final Clinical Impressions(s) / ED Diagnoses   Final diagnoses:  Precordial pain  Hypertensive urgency  Chest pain   Patient describes chest pain starting during the night.  This is with associated shortness of breath.  Patient arrives with significant hypertension.  Chest x-ray does not show vascular congestion.  Patient presentation concerning for hypertensive urgency with chest pain.  Nitroglycerin paste applied and hydralazine administered.  First troponin is normal.  EKG does not show acute ischemic pattern.  Plan for admission for rule out of MI and continued management of hypertension.  Patient's mental status is  stable.  She did incidentally have some appearance of facial droop but no stroke history identified.  She had older facial bruising that she reports was from a previous fall.  CT head obtained without acute findings. ED Discharge Orders    None       Charlesetta Shanks, MD 01/13/18 [phone]

## 2018-01-12 NOTE — ED Notes (Signed)
Pt to CT

## 2018-01-12 NOTE — ED Triage Notes (Signed)
Patient arrives with [facility] from [facility] in [place], [place] with complaint of chest pain that started last night, pain resided and then started again this morning. Patient has 18g saline lock in bilateral AC. Received 324mg  aspirin and 2x SL NTG, patient reports improvement in chest pressure after NTG. Patient states she has taken all of her medications this morning, hypertensive at 211/94 on arrival. Speech slurred at baseline.

## 2018-01-12 NOTE — Progress Notes (Signed)
[place] for enoxaparin Indication: VTE prophylaxis  Allergies  Allergen Reactions  . Benztropine Mesylate Other (See Comments)    REACTION: Alopecia and white spots on eyes  . Codeine Other (See Comments)    REACTION: Mouth Swelling - Tachycardia  . Diazepam Other (See Comments)    REACTION: COMA  . Diphenhydramine Hcl Other (See Comments)    REACTION: Tachycardia , anxiousness  . Penicillins Other (See Comments)    REACTION: ulcers in mouth, swelling, throat swelling  Has patient had a PCN reaction causing immediate rash, facial/tongue/throat swelling, SOB or lightheadedness with hypotension: YES Has patient had a PCN reaction causing severe rash involving Mucus membranes or skin necrosis: Unknown Has patient had a PCN reaction that required hospitalization Unknown Has patient had a PCN reaction occurring within the last 10 years: unknown If all of the above answers are "NO", then may proceed with Cephalospori  . Sulfonamide Derivatives Other (See Comments)    REACTION: rash - blisters  . Thioridazine Hcl Other (See Comments)    REACTION: swelling  . Lisinopril Other (See Comments)    REACTION: cough    Patient Measurements: Height: 5' (152.4 cm) Weight: 212 lb 4.8 oz (96.3 kg) IBW/kg (Calculated) : 45.5  Vital Signs: Temp: 97.6 F (36.4 C) (05/30 1811) Temp Source: Oral (05/30 1811) BP: 212/75 (05/30 1811) Pulse Rate: 66 (05/30 1811)  Labs: Recent Labs    01/12/18 1056 01/12/18 1107  HGB 9.8* 8.2*  HCT 28.3* 24.0*  PLT 157  --   LABPROT 13.0  --   INR 0.99  --   CREATININE 1.82* 1.70*    Estimated Creatinine Clearance: 33.8 mL/min (A) (by C-G formula based on SCr of 1.7 mg/dL (H)).   Medical History: Past Medical History:  Diagnosis Date  . Anxiety   . Asthma   . Back pain   . Barrett esophagus   . CKD (chronic kidney disease) stage 3, GFR 30-59 ml/min (HCC)   . Depression   . Diastolic heart failure ([place])   .  Essential hypertension   . Fibromyalgia   . GERD (gastroesophageal reflux disease)   . Gout   . History of pericarditis   . Hyperlipidemia   . Nephrolithiasis    2008  . Obesity hypoventilation syndrome (HCC)    CPAP  . PE (pulmonary embolism)    2010  . Peripheral neuropathy   . Rheumatoid arthritis ([place])   . Schizophrenia ([place])   . Steroid dependence   . Type 2 diabetes mellitus ([place])   . Vitamin D deficiency     Medications:  Scheduled:  . [START ON 01/13/2018] aspirin EC  81 mg Oral Daily  . atorvastatin  40 mg Oral Q2000  . carvedilol  25 mg Oral BID WC  . [START ON 01/13/2018] DULoxetine  60 mg Oral Daily  . enoxaparin (LOVENOX) injection  40 mg Subcutaneous Q24H  . gabapentin  300 mg Oral BID  . hydrALAZINE  50 mg Oral Q8H  . insulin aspart  0-5 Units Subcutaneous QHS  . [START ON 01/13/2018] insulin aspart  0-9 Units Subcutaneous TID WC  . iopamidol      . [START ON 01/13/2018] levothyroxine  25 mcg Oral QAC breakfast  . mometasone-formoterol  2 puff Inhalation BID  . montelukast  10 mg Oral QHS  . nitroGLYCERIN  1 inch Topical Q6H  . [START ON 01/13/2018] pantoprazole  40 mg Oral Daily  . [START ON 01/13/2018] pneumococcal 23 valent vaccine  0.5 mL Intramuscular Tomorrow-1000  . prazosin  2 mg Oral QHS  . pyridOXINE  200 mg Oral BID  . risperiDONE  0.5 mg Oral BID  . traZODone  100 mg Oral QHS    Assessment: 78 yof admitted with chest pain - hx of PE (April 2010), not on anticoag PTA. Scr 1.7 (above previous Scr ~1.4). Hgb 8.2, plt 157. D-dimer 2.32 - CTA negative for PE. No s/sx of bleeding.   Goal of Therapy:  Monitor platelets by anticoagulation protocol: Yes   Plan:  Start enoxaparin 40 mg daily starting today Monitor renal function, CBC, s/sx of bleeding  Doylene Canard, PharmD Clinical Pharmacist  Pager: [phone] Phone: [phone] 01/12/2018,7:23 PM

## 2018-01-13 DIAGNOSIS — I129 Hypertensive chronic kidney disease with stage 1 through stage 4 chronic kidney disease, or unspecified chronic kidney disease: Secondary | ICD-10-CM | POA: Diagnosis not present

## 2018-01-13 DIAGNOSIS — Z743 Need for continuous supervision: Secondary | ICD-10-CM | POA: Diagnosis not present

## 2018-01-13 DIAGNOSIS — R0789 Other chest pain: Secondary | ICD-10-CM

## 2018-01-13 DIAGNOSIS — E1142 Type 2 diabetes mellitus with diabetic polyneuropathy: Secondary | ICD-10-CM | POA: Diagnosis not present

## 2018-01-13 DIAGNOSIS — N179 Acute kidney failure, unspecified: Secondary | ICD-10-CM | POA: Diagnosis present

## 2018-01-13 DIAGNOSIS — E1122 Type 2 diabetes mellitus with diabetic chronic kidney disease: Secondary | ICD-10-CM | POA: Diagnosis not present

## 2018-01-13 DIAGNOSIS — N189 Chronic kidney disease, unspecified: Secondary | ICD-10-CM

## 2018-01-13 DIAGNOSIS — R079 Chest pain, unspecified: Secondary | ICD-10-CM | POA: Diagnosis not present

## 2018-01-13 DIAGNOSIS — R279 Unspecified lack of coordination: Secondary | ICD-10-CM | POA: Diagnosis not present

## 2018-01-13 LAB — TROPONIN I
Troponin I: <0.03 ng/mL (ref <0.03)
Troponin I: <0.03 ng/mL (ref <0.03)

## 2018-01-13 LAB — CBC
HCT: 27.9 % — ABNORMAL LOW (ref 36.0–46.0)
Hemoglobin: 9.5 g/dL — ABNORMAL LOW (ref 12.0–15.0)
MCH: 32.4 pg (ref 26.0–34.0)
MCHC: 34.1 g/dL (ref 30.0–36.0)
MCV: 95.2 fL (ref 78.0–100.0)
Platelets: 162 10*3/uL (ref 150–400)
RBC: 2.93 MIL/uL — ABNORMAL LOW (ref 3.87–5.11)
RDW: 12.8 % (ref 11.5–15.5)
WBC: 8.4 10*3/uL (ref 4.0–10.5)

## 2018-01-13 LAB — BASIC METABOLIC PANEL
Anion gap: 7 (ref 5–15)
BUN: 29 mg/dL — ABNORMAL HIGH (ref 6–20)
CO2: 21 mmol/L — ABNORMAL LOW (ref 22–32)
Calcium: 8.5 mg/dL — ABNORMAL LOW (ref 8.9–10.3)
Chloride: 111 mmol/L (ref 101–111)
Creatinine, Ser: 1.79 mg/dL — ABNORMAL HIGH (ref 0.44–1.00)
GFR calc Af Amer: 33 mL/min — ABNORMAL LOW (ref >60)
GFR calc non Af Amer: 28 mL/min — ABNORMAL LOW (ref >60)
Glucose, Bld: 251 mg/dL — ABNORMAL HIGH (ref 65–99)
Potassium: 4.3 mmol/L (ref 3.5–5.1)
Sodium: 139 mmol/L (ref 135–145)

## 2018-01-13 LAB — GLUCOSE, CAPILLARY
Glucose-Capillary: 226 mg/dL — ABNORMAL HIGH (ref 65–99)
Glucose-Capillary: 202 mg/dL — ABNORMAL HIGH (ref 65–99)
Glucose-Capillary: 221 mg/dL — ABNORMAL HIGH (ref 65–99)
Glucose-Capillary: 287 mg/dL — ABNORMAL HIGH (ref 65–99)

## 2018-01-13 LAB — HIV ANTIBODY (ROUTINE TESTING W REFLEX): HIV Screen 4th Generation wRfx: NONREACTIVE

## 2018-01-13 MED ORDER — HYDRALAZINE HCL 50 MG PO TABS: 50.0000 mg | ORAL_TABLET | Freq: Three times a day (TID) | ORAL | 0 refills | Status: DC

## 2018-01-13 NOTE — Progress Notes (Signed)
Pneumonia vaccine record card given to pt.

## 2018-01-13 NOTE — Progress Notes (Signed)
Pt D/C from unit. [facility] transporting pt back to [facility] in [place]. Pt BP 176/80, MD notified of elevated BP. MD stated that pt was safe to transfer out and feels that her meds that were given will sustain her. IV and telemetry discontinued, paper work given to [employer].   Tressie Ellis, RN

## 2018-01-13 NOTE — Clinical Social Work Note (Signed)
CSW facilitated patient discharge including contacting facility to confirm patient discharge plans. Clinical information faxed to facility and family agreeable with plan. CSW arranged ambulance transport via [facility] to [facility]. RN to call report prior to discharge [name] - ALF Administrator: [phone]).  CSW will sign off for now as social work intervention is no longer needed. Please consult [place] again if new needs arise.  [name], [name]

## 2018-01-13 NOTE — Discharge Instructions (Signed)
[name] the test that we performed ( troponin, EKG, xray of the chest, CT scan of the chest) did not show the cause of your chest pain. We are glad you are feeling better and that the chest pain got better on its own. We believe you may have had precordial catch syndrome however you will need to continue working with your doctor at home and watching out for this to happen again.   Please continue taking all of your medications except for losartan and spironolactone, please hold off on taking this medication until you have followed up with your doctor and have your kidneys rechecked. Instead of the losartan and spironolactone please start taking hydralazine 50 mg every 8 hours, we are hopeful that you can stop taking this once you are back on losartan.   Please schedule a follow up appointment with your doctor early next week. Tell your doctor that you were in the hospital and had high blood pressure,  hemoglobin A1c of 6.8, and creatinine 1.79. You doctor will probably want to increase your diabetes medication. Please also ask your doctor what dose of vitamin D you should be taking.   Please call our clinic if you have any questions or concerns, we may be able to help and keep you from a long and expensive emergency room wait. Our clinic and after hours phone number is [phone], there is always someone available.

## 2018-01-13 NOTE — Progress Notes (Signed)
Subjective: Feels much better this morning. Chest pain has resolved. She was able to get up and move to the chair on her own. She feels ready to go home.  Objective:  Vital signs in last 24 hours: Vitals:   01/13/18 0540 01/13/18 0736 01/13/18 0933 01/13/18 1100  BP: 130/61 138/64  130/71  Pulse:  80  74  Resp:  (!) 21  17  Temp:  98.4 F (36.9 C)  98.3 F (36.8 C)  TempSrc:  Oral  Oral  SpO2:  95% 96% 95%  Weight:      Height:       Weight change:   Intake/Output Summary (Last 24 hours) at 01/13/2018 1525 Last data filed at 01/13/2018 0008 Gross per 24 hour  Intake 320 ml  Output 1700 ml  Net -1380 ml   Vitals:   01/13/18 0933 01/13/18 1100  BP:  130/71  Pulse:  74  Resp:  17  Temp:  98.3 F (36.8 C)  SpO2: 96% 95%   General appearance: Well-appearing woman in no distress lounging comfortably in chair Head: periorbital ecchymosis on left unchanged from yesterday Lungs: clear to auscultation bilaterally and no w/r/r Heart: regular rate and rhythm, S1, S2 normal and systolic murmur: systolic ejection 1/6, crescendo-decrescendo at 2nd right intercostal space Abdomen: soft, non-tender; bowel sounds normal; no masses,  no organomegaly Extremities: extremities normal, atraumatic, no cyanosis or edema, edema 1+ pitting edema and healing ulcer on right foot, left great toe crosses over left second toe Pulses: 2+ and symmetric Skin: erythematous patches in intertrigenous zones of abdomen and groin consistent with candidal intertrigo Neurologic: Grossly normal. Subtle tongue movements consistent with tardive dyskinesia Psych: Poor eye contact. Mental status grossly intact but not formally assessed  Assessment/Plan:  Active Problems:   Type 2 diabetes mellitus with diabetic polyneuropathy, without long-term current use of insulin (HCC)   Essential hypertension   Chest pain   Acute kidney injury superimposed on chronic kidney disease ([place])  Beaux Archeris a67  y.o.femalewith hx of HFpEF, CKD3, DM2, proteinuria of unknown etiology, HTN, metabolic bone disease, anemia, schizophrenia, PE (April 2010), acute pericarditis (May 2010), GERD with Barrett's Esophagus (EGD 8/07), Asthma (PFT's 10/14/06), rheumatoid arthritis, and hypothyroidism who presented with 12 hours of sharp, sudden-onset, left-sided chest pain radiating to her back.  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 Echocardiogram showed an EF of 60-65%, a small pericardial effusion, and left pleural effusion. Her chest pain resolved spontaneously overnight.  New-onsetchest pain localizing between the scapula was initially concerning for aortic dissection. However, CTA did not show this. Pleuritic nature of chest pain was also concerning for PE or pericarditis. PE was intially thought to be possible due to a history of PE, a Geneva score of 4, and a D-Dimer of 2.32. However, she had normal O2 sats on room air, she was not tachycardic, and she was hypertensive. Although her CTA was done with aortic dissection protocol, which might not show a small PE, the team is reassured by the absence of a large PE. She did have a history of suspected pericarditis in 2010, and this was initially thought to be a likely possibility. The echocardiogram showing a small pericardial effusion also initially supported this diagnosis. However, the rapid resolution of her pain without anti-inflammatory medications (other than ASA) points away does this diagnosis. Although early pericarditis does not always show diffuse ST elevations, the persistent absence of  ST changes on her ECG is reassuring. Pancreatitis is unlikely given a lipase of 25. MI seems unlikely at this point given the absence of ECG changes and 3 negative troponins. Given her recent fall, metabolic bone disease, and history of RA, MSK issues must remain on the differential as well.  Fortunately a fracture was not visible on the CTA. Her history of Barrett's esophagus secondary to longstanding GERD was also concerning for possible esophageal process (I.e. Mallory-Weiss tear). However, this also would have been visible on her CTA. 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  Acute on chronic kidney disease Cr 1.46 on 10/11/17. Cr 1.82 on admission, which would correspond to CKD4 if this is her new baseline.The team took every possible measure to avoid IV contrast given her CKD, including obtaining an echocardiogram and D-dimer first. However, the high suspicion for aortic dissection rendered the benefits of the procedure higher than the risks.She may also develop contrast-induced nephropathy over the next several days due to the high contrast load from her CTA. A 1 L bolus of normal saline was started shortly before her CTA. She should have a creatinine drawn at her follow-up visit to check for contrast-induced nephropathy and resolution of acute on chronic kidney disease.  DM2 A1c was 5.6% on 10/13/26. She does not take any anti-DM medications at home. Repeat A1c on admission was 6.8%. She required 3 units of sliding scale insulin twice while hospitalized. She would likely benefit from starting an anti-hyperglycemic agent as an outpatient.  Asthma Continued home medications: Albuterol 2.5 mg q6h PRN, Formoterol / mometasone 200-5 BID, and montelukast 10 QD.  HLD Continued home atorvastatin 40 QD.  HTN Systolics in 094'B on admission. Maximum BP 221/94. She reports excellent compliance with her home BP regimen, since she is at an ALF that controls her meds for her. She is on carvedilol, spironolactone, and losartan at home. Carvedilol was continued. Losartan and spironolactone were held due to AKI. She received hydralazine 50 mg PO q8h while inpatient, as well as 10 mg of labetalol x2. Her BP normalized to 130/60,  and she remained normotensive throughout the remainder of the hospitalization. Her elevated BP's were initially concerning for aortic dissection. However, they were more likely attributable to pain and anxiety in the setting of her severe chest pain. Her spironolactone and losartan are being held on discharge due to her AKI and hydralazine was started temporarily. Consider resuming at outpatient follow-up.  Schizophrenia / Depression Per chart review, this has been well-controlled for at least 9 years. Follows with [facility]na [facility] at [facility]. Mental status examination is normal at this time, except for mild tongue movements consistent with tardive dyskinesia. Home meds were continued while inpatient: Duloxetine 60 mg QD, Gabapentin 300 BID, Prazosin 2 mg QD, Risperidone 0.5 mg BID, Trazodone 100 mg QHS.  Hypothyroidism Continued home levothyroxine 25 mg QD.  GERD with Barrett's Esophagus Continued home pantoprazole 40 mg QD.   LOS: 0 days   Doristine Johns, Medical Student 01/13/2018, 3:25 PM
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
## 2018-01-13 NOTE — Discharge Summary (Addendum)
Name: [name] MRN: [id number] DOB: 08-28-1950 67 y.o. PCP: Jessee Avers, FNP  Date of Admission: 01/12/2018 10:38 AM Date of Discharge: 01/13/2018 Attending Physician: Oda Kilts, MD  Discharge Diagnosis: Active Problems:   Type 2 diabetes mellitus with diabetic polyneuropathy, without long-term current use of insulin (HCC)   Essential hypertension   Chest pain   Acute kidney injury superimposed on chronic kidney disease [facility])   Discharge Medications: Allergies as of 01/13/2018      Reactions   Benztropine Mesylate Other (See Comments)   REACTION: Alopecia and white spots on eyes   Codeine Other (See Comments)   REACTION: Mouth Swelling - Tachycardia   Diazepam Other (See Comments)   REACTION: COMA   Diphenhydramine Hcl Other (See Comments)   REACTION: Tachycardia , anxiousness   Penicillins Other (See Comments)   REACTION: ulcers in mouth, swelling, throat swelling  Has patient had a PCN reaction causing immediate rash, facial/tongue/throat swelling, SOB or lightheadedness with hypotension: YES Has patient had a PCN reaction causing severe rash involving Mucus membranes or skin necrosis: Unknown Has patient had a PCN reaction that required hospitalization Unknown Has patient had a PCN reaction occurring within the last 10 years: unknown If all of the above answers are "NO", then may proceed with Cephalospori   Sulfonamide Derivatives Other (See Comments)   REACTION: rash - blisters   Thioridazine Hcl Other (See Comments)   REACTION: swelling   Lisinopril Other (See Comments)   REACTION: cough      Medication List    STOP taking these medications   doxycycline 100 MG tablet Commonly known as:  VIBRA-TABS   losartan 50 MG tablet Commonly known as:  COZAAR   mupirocin cream 2 % Commonly known as:  BACTROBAN   spironolactone 25 MG tablet Commonly known as:  ALDACTONE     TAKE these medications   acetaminophen 325 MG tablet Commonly known as:   TYLENOL Take 325 mg by mouth 2 (two) times daily.   albuterol (5 MG/ML) 0.5% nebulizer solution Commonly known as:  PROVENTIL Take 2.5 mg by nebulization every 6 (six) hours as needed for wheezing or shortness of breath.   albuterol 108 (90 Base) MCG/ACT inhaler Commonly known as:  PROVENTIL HFA;VENTOLIN HFA Inhale 2 puffs into the lungs every 6 (six) hours as needed for wheezing or shortness of breath.   antiseptic oral rinse Liqd 15 mLs by Mouth Rinse route daily.   aspirin 81 MG tablet Take 81 mg by mouth daily.   atorvastatin 40 MG tablet Commonly known as:  LIPITOR Take 40 mg by mouth daily.   BABY POWDER EX Apply 1 application topically daily as needed (for rash).   budesonide-formoterol 160-4.5 MCG/ACT inhaler Commonly known as:  SYMBICORT Inhale 2 puffs into the lungs 2 (two) times daily.   carvedilol 25 MG tablet Commonly known as:  COREG Take 25 mg by mouth 2 (two) times daily with a meal.   cholecalciferol 1000 units tablet Commonly known as:  VITAMIN D Take 4,000 Units by mouth daily.   DAILY VITE PO Take 1 tablet by mouth daily.   docusate sodium 100 MG capsule Commonly known as:  COLACE Take 100 mg by mouth daily.   DULoxetine 60 MG capsule Commonly known as:  CYMBALTA Take 60 mg by mouth daily.   EPINEPHrine 0.3 mg/0.3 mL Devi Commonly known as:  EPI-PEN Inject 0.3 mg into the muscle once.   gabapentin 300 MG capsule Commonly known as:  NEURONTIN  Take 300 mg by mouth 2 (two) times daily.   hydrALAZINE 50 MG tablet Commonly known as:  APRESOLINE Take 1 tablet (50 mg total) by mouth every 8 (eight) hours.   hydrocortisone 2.5 % ointment Apply 1 application topically daily as needed (itchy skin).   hydrOXYzine 25 MG capsule Commonly known as:  VISTARIL Take 25 mg by mouth 2 (two) times daily as needed for anxiety.   ipratropium 0.02 % nebulizer solution Commonly known as:  ATROVENT Take 0.5 mg by nebulization every 6 (six) hours as  needed for wheezing or shortness of breath.   levothyroxine 25 MCG tablet Commonly known as:  SYNTHROID, LEVOTHROID Take 25 mcg by mouth daily before breakfast.   linagliptin 5 MG Tabs tablet Commonly known as:  TRADJENTA Take 5 mg by mouth daily.   loperamide 2 MG capsule Commonly known as:  IMODIUM Take 2 mg by mouth 3 (three) times daily as needed for diarrhea or loose stools.   LORazepam 1 MG tablet Commonly known as:  ATIVAN Take 1 mg by mouth daily as needed for anxiety.   montelukast 10 MG tablet Commonly known as:  SINGULAIR Take 10 mg by mouth at bedtime.   neomycin-polymyxin b-dexamethasone 3.5-10000-0.1 Oint Commonly known as:  MAXITROL Place 1 application into both eyes 2 (two) times daily.   NON FORMULARY 1 each by Other route See admin instructions. CPAP at bedtime. CPAP setting of 15CM of water   nystatin cream Commonly known as:  MYCOSTATIN Apply 1 application topically 2 (two) times daily.   omeprazole 20 MG capsule Commonly known as:  PRILOSEC Take 20 mg by mouth daily.   polyethylene glycol packet Commonly known as:  MIRALAX / GLYCOLAX Take 17 g by mouth daily as needed for mild constipation.   prazosin 2 MG capsule Commonly known as:  MINIPRESS Take 2 mg by mouth at bedtime. For PTSD associated nightmares   pyridOXINE 100 MG tablet Commonly known as:  VITAMIN B-6 Take 200 mg by mouth 2 (two) times daily.   risperiDONE 0.5 MG tablet Commonly known as:  RISPERDAL Take 0.5 mg by mouth 2 (two) times daily.   SYSTANE COMPLETE OP Place 1 drop into both eyes 2 (two) times daily.   torsemide 10 MG tablet Commonly known as:  DEMADEX Take 10 mg by mouth daily.   traZODone 100 MG tablet Commonly known as:  DESYREL Take 100 mg by mouth at bedtime.       Disposition and follow-up:   [name] was discharged from [facility] in Stable condition.  At the hospital follow up visit please address:  1.  She should have a  creatinine drawn at her follow-up visit to check for contrast-induced nephropathy and resolution of acute on chronic kidney disease.  2.  Her spironolactone and losartan are being held on discharge due to her AKI. Consider resuming at outpatient follow-up.  3. A1c was 6.8%. She would likely benefit from starting an anti-hyperglycemic agent as an outpatient.  4.  Labs / imaging needed at time of follow-up: Repeat creatinine  5.  Pending labs/ test needing follow-up: None  Follow-up Appointments: PCP's office is closed on day of discharge. Patient's ALF informed to make appointment on Monday.  Hospital Course by problem list: Active Problems:   Type 2 diabetes mellitus with diabetic polyneuropathy, without long-term current use of insulin (HCC)   Essential hypertension   Chest pain   Acute kidney injury superimposed on chronic kidney disease ([place])  [name] is a 68 y.o. female with hx of HFpEF, CKD3, DM2, proteinuria of unknown etiology, HTN, metabolic bone disease, anemia, schizophrenia, PE (April 2010), acute pericarditis (May 2010), GERD with Barrett's Esophagus (EGD 8/07), Asthma (PFT's 10/14/06), rheumatoid arthritis, and hypothyroidism who presented with 12 hours of sharp, sudden-onset, left-sided chest pain radiating to her back.  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 Echocardiogram showed an EF of 60-65%, a small pericardial effusion, and left pleural effusion. Her chest pain resolved spontaneously overnight.  New-onset chest pain localizing between the scapula was initially concerning for aortic dissection. However, CTA did not show this. Pleuritic nature of chest pain was also concerning for PE or pericarditis. PE was intially thought to be possible due to a history of PE, a Geneva score of 4, and a D-Dimer of 2.32. However, she had normal O2 sats on room air, she was not tachycardic, and  she was hypertensive. Although her CTA was done with aortic dissection protocol, which might not show a small PE, the team is reassured by the absence of a large PE. She did have a history of suspected pericarditis in 2010, and this was initially thought to be a likely possibility. The echocardiogram showing a small pericardial effusion also initially supported this diagnosis. However, the rapid resolution of her pain without anti-inflammatory medications (other than ASA) points away does this diagnosis. Although early pericarditis does not always show diffuse ST elevations, the persistent absence of ST changes on her ECG is reassuring. Pancreatitis is unlikely given a lipase of 25. MI seems unlikely at this point given the absence of ECG changes and 3 negative troponins. Given her recent fall, metabolic bone disease, and history of RA, MSK issues must remain on the differential as well. Fortunately a fracture was not visible on the CTA. Her history of Barrett's esophagus secondary to longstanding GERD was also concerning for possible esophageal process (I.e. Mallory-Weiss tear). However, this also would have been visible on her CTA. 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  Acute on chronic kidney disease Cr 1.46 on 10/11/17. Cr 1.82 on admission, which would correspond to CKD4 if this is her new baseline. The team took every possible measure to avoid IV contrast given her CKD, including obtaining an echocardiogram and D-dimer first. However, the high suspicion for aortic dissection rendered the benefits of the procedure higher than the risks. She may also develop contrast-induced nephropathy over the next several days due to the high contrast load from her CTA. A 1 L bolus of normal saline was started shortly before her CTA. She should have a creatinine drawn at her follow-up visit to check for contrast-induced nephropathy and resolution of acute  on chronic kidney disease.  DM2 A1c was 5.6% on 10/13/26. She does not take any anti-DM medications at home. Repeat A1c on admission was 6.8%. She required 3 units of sliding scale insulin twice while hospitalized. She would likely benefit from starting an anti-hyperglycemic agent as an outpatient.  Asthma Continued home medications: Albuterol 2.5 mg q6h PRN, Formoterol / mometasone 200-5 BID, and montelukast 10 QD.  HLD Continued home atorvastatin 40 QD.  HTN Systolics in 712'R on admission. Maximum BP 221/94. She reports excellent compliance with her home BP regimen, since she is at an ALF that controls her meds for her. She is on carvedilol, spironolactone, and losartan at home. Carvedilol was continued.  Losartan and spironolactone were held due to AKI. She received hydralazine 50 mg PO q8h while inpatient, as well as 10 mg of labetalol x2. Her BP normalized to 130/60, and she remained normotensive throughout the remainder of the hospitalization. Her elevated BP's were initially concerning for aortic dissection. However, they were more likely attributable to pain and anxiety in the setting of her severe chest pain. Her spironolactone and losartan are being held on discharge due to her AKI and hydralazine was started temporarily. Consider resuming at outpatient follow-up.  Schizophrenia / Depression Per chart review, this has been well-controlled for at least 9 years. Follows with Dr. Mirna Mires at [facility]. Mental status examination is normal at this time, except for mild tongue movements consistent with tardive dyskinesia. Home meds were continued while inpatient: Duloxetine 60 mg QD, Gabapentin 300 BID, Prazosin 2 mg QD, Risperidone 0.5 mg BID, Trazodone 100 mg QHS.  Hypothyroidism Continued home levothyroxine 25 mg QD.  GERD with Barrett's Esophagus Continued home pantoprazole 40 mg QD.  Discharge Vitals:   BP 130/71 (BP Location: Right Arm)   Pulse 74   Temp 98.3 F (36.8 C) (Oral)    Resp 17   Ht 5' (1.524 m)   Wt 220 lb 10.9 oz (100.1 kg)   SpO2 95%   BMI 43.10 kg/m   Pertinent Labs, Studies, and Procedures:  BMP Latest Ref Rng & Units 01/13/2018 01/12/2018 01/12/2018  Glucose 65 - 99 mg/dL 251(H) 242(H) 248(H)  BUN 6 - 20 mg/dL 29(H) 34(H) 30(H)  Creatinine 0.44 - 1.00 mg/dL 1.79(H) 1.70(H) 1.82(H)  Sodium 135 - 145 mmol/L 139 140 140  Potassium 3.5 - 5.1 mmol/L 4.3 4.5 4.6  Chloride 101 - 111 mmol/L 111 107 110  CO2 22 - 32 mmol/L 21(L) - 22  Calcium 8.9 - 10.3 mg/dL 8.5(L) - 9.1   Troponin (Point of Care Test) Recent Labs    01/12/18 1105  TROPIPOC 0.02   Lipase     Component Value Date/Time   LIPASE 25 01/12/2018 2109   A1c of 6.8  Discharge Instructions: Discharge Instructions    Call MD for:  difficulty breathing, headache or visual disturbances   Complete by:  As directed    Call MD for:  persistant nausea and vomiting   Complete by:  As directed    Call MD for:  severe uncontrolled pain   Complete by:  As directed    Diet - low sodium heart healthy   Complete by:  As directed    Increase activity slowly   Complete by:  As directed       Signed: Ledell Noss, MD 01/13/2018, 3:27 PM   Pager: [phone]  Attestation for Student Documentation:  I personally was present and performed or re-performed the history, physical exam and medical decision-making activities of this service and have verified that the service and findings are accurately documented in the student's note.  Ledell Noss, MD 01/13/2018, 3:27 PM

## 2018-01-13 NOTE — Progress Notes (Signed)
Awaiting [facility] ambulance to come and pick up pt for discharge to [facility]. Report given to legal guardian Mr. [name].

## 2018-01-16 ENCOUNTER — Telehealth: Payer: Self-pay | Admitting: Internal Medicine

## 2018-01-16 MED ORDER — HYDRALAZINE HCL 50 MG PO TABS: 50.0000 mg | ORAL_TABLET | Freq: Three times a day (TID) | ORAL | 1 refills | Status: DC

## 2018-01-16 NOTE — Telephone Encounter (Signed)
This encounter was created after the nurse on [facility] floor at the time of hospitalization received a call from [facility] facility stating that hydralazine prescription needed to be forwarded to a different pharmacy. A new Rx for hydralazine was forwarded to [facility] [place].

## 2018-01-16 NOTE — Progress Notes (Signed)
Kasey from [facility] called regarding patient's prescription that was sent to [facility] after discharge. Director says they use [facility] for medications, also needs note that verifies that indeed medications discontinued during hospital stay were discontinued. Hospital summary/AVS is insufficient.  [facility] paged, said they would fix prescriptions and send note to [facility] regarding discontinued medications.

## 2018-01-18 DIAGNOSIS — E1140 Type 2 diabetes mellitus with diabetic neuropathy, unspecified: Secondary | ICD-10-CM | POA: Diagnosis not present

## 2018-01-18 DIAGNOSIS — L110 Acquired keratosis follicularis: Secondary | ICD-10-CM | POA: Diagnosis not present

## 2018-01-18 DIAGNOSIS — M79671 Pain in right foot: Secondary | ICD-10-CM | POA: Diagnosis not present

## 2018-01-18 DIAGNOSIS — M79672 Pain in left foot: Secondary | ICD-10-CM | POA: Diagnosis not present

## 2018-02-07 DIAGNOSIS — I10 Essential (primary) hypertension: Secondary | ICD-10-CM | POA: Diagnosis not present

## 2018-02-07 DIAGNOSIS — I2510 Atherosclerotic heart disease of native coronary artery without angina pectoris: Secondary | ICD-10-CM | POA: Diagnosis not present

## 2018-02-07 DIAGNOSIS — N183 Chronic kidney disease, stage 3 (moderate): Secondary | ICD-10-CM | POA: Diagnosis not present

## 2018-02-07 DIAGNOSIS — J449 Chronic obstructive pulmonary disease, unspecified: Secondary | ICD-10-CM | POA: Diagnosis not present

## 2018-02-07 DIAGNOSIS — Z6841 Body Mass Index (BMI) 40.0 and over, adult: Secondary | ICD-10-CM | POA: Diagnosis not present

## 2018-02-07 DIAGNOSIS — E1122 Type 2 diabetes mellitus with diabetic chronic kidney disease: Secondary | ICD-10-CM | POA: Diagnosis not present

## 2018-02-07 DIAGNOSIS — E1165 Type 2 diabetes mellitus with hyperglycemia: Secondary | ICD-10-CM | POA: Diagnosis not present

## 2018-02-08 DIAGNOSIS — E559 Vitamin D deficiency, unspecified: Secondary | ICD-10-CM | POA: Diagnosis not present

## 2018-02-08 DIAGNOSIS — Z79899 Other long term (current) drug therapy: Secondary | ICD-10-CM | POA: Diagnosis not present

## 2018-02-08 DIAGNOSIS — R809 Proteinuria, unspecified: Secondary | ICD-10-CM | POA: Diagnosis not present

## 2018-02-08 DIAGNOSIS — I129 Hypertensive chronic kidney disease with stage 1 through stage 4 chronic kidney disease, or unspecified chronic kidney disease: Secondary | ICD-10-CM | POA: Diagnosis not present

## 2018-02-08 DIAGNOSIS — D509 Iron deficiency anemia, unspecified: Secondary | ICD-10-CM | POA: Diagnosis not present

## 2018-02-08 DIAGNOSIS — N183 Chronic kidney disease, stage 3 (moderate): Secondary | ICD-10-CM | POA: Diagnosis not present

## 2018-02-21 DIAGNOSIS — J449 Chronic obstructive pulmonary disease, unspecified: Secondary | ICD-10-CM | POA: Diagnosis not present

## 2018-02-21 DIAGNOSIS — Z6841 Body Mass Index (BMI) 40.0 and over, adult: Secondary | ICD-10-CM | POA: Diagnosis not present

## 2018-02-21 DIAGNOSIS — N183 Chronic kidney disease, stage 3 (moderate): Secondary | ICD-10-CM | POA: Diagnosis not present

## 2018-02-21 DIAGNOSIS — I10 Essential (primary) hypertension: Secondary | ICD-10-CM | POA: Diagnosis not present

## 2018-02-21 DIAGNOSIS — Z299 Encounter for prophylactic measures, unspecified: Secondary | ICD-10-CM | POA: Diagnosis not present

## 2018-02-21 DIAGNOSIS — E1122 Type 2 diabetes mellitus with diabetic chronic kidney disease: Secondary | ICD-10-CM | POA: Diagnosis not present

## 2018-02-21 DIAGNOSIS — E1165 Type 2 diabetes mellitus with hyperglycemia: Secondary | ICD-10-CM | POA: Diagnosis not present

## 2018-02-28 DIAGNOSIS — E039 Hypothyroidism, unspecified: Secondary | ICD-10-CM | POA: Diagnosis not present

## 2018-02-28 DIAGNOSIS — I2510 Atherosclerotic heart disease of native coronary artery without angina pectoris: Secondary | ICD-10-CM | POA: Diagnosis not present

## 2018-02-28 DIAGNOSIS — L03116 Cellulitis of left lower limb: Secondary | ICD-10-CM | POA: Diagnosis not present

## 2018-02-28 DIAGNOSIS — Z299 Encounter for prophylactic measures, unspecified: Secondary | ICD-10-CM | POA: Diagnosis not present

## 2018-02-28 DIAGNOSIS — I10 Essential (primary) hypertension: Secondary | ICD-10-CM | POA: Diagnosis not present

## 2018-02-28 DIAGNOSIS — Z6837 Body mass index (BMI) 37.0-37.9, adult: Secondary | ICD-10-CM | POA: Diagnosis not present

## 2018-02-28 DIAGNOSIS — E1122 Type 2 diabetes mellitus with diabetic chronic kidney disease: Secondary | ICD-10-CM | POA: Diagnosis not present

## 2018-03-10 DIAGNOSIS — B070 Plantar wart: Secondary | ICD-10-CM | POA: Diagnosis not present

## 2018-03-10 DIAGNOSIS — M79674 Pain in right toe(s): Secondary | ICD-10-CM | POA: Diagnosis not present

## 2018-03-10 DIAGNOSIS — E0840 Diabetes mellitus due to underlying condition with diabetic neuropathy, unspecified: Secondary | ICD-10-CM | POA: Diagnosis not present

## 2018-03-10 DIAGNOSIS — L84 Corns and callosities: Secondary | ICD-10-CM | POA: Diagnosis not present

## 2018-03-10 DIAGNOSIS — M2041 Other hammer toe(s) (acquired), right foot: Secondary | ICD-10-CM | POA: Diagnosis not present

## 2018-03-10 DIAGNOSIS — B351 Tinea unguium: Secondary | ICD-10-CM | POA: Diagnosis not present

## 2018-03-15 NOTE — Progress Notes (Signed)
Cardiology Office Note  Date: 03/16/2018   ID: [name], DOB Jul 05, 1951, MRN [id number]  PCP: Kathleen Avers, FNP  Primary Cardiologist: Kathleen Lesches, MD   Chief Complaint  Patient presents with  . Diastolic heart failure    History of Present Illness: [name] is a 67 y.o. female that I have not seen in the office since 2017.  Records indicate that she was recently hospitalized in May with chest pain.  She ruled out for ACS with negative troponin I levels, did not have obvious large vessel pulmonary embolus by chest CTA.  There was a small pericardial effusion by echocardiogram, however her symptoms resolved without NSAIDs.  Medications were adjusted during her stay due to contrast nephropathy (she was taken off spironolactone and losartan).  She is now a resident of [facility] in [place].  She is here today with an Environmental consultant.  Blood pressure has been significantly elevated recently, I reviewed her medications which have been adjusted since her hospital stay by Dr. Woody Seller, in fact losartan was just doubled from 50 mg to 100 mg yesterday.  We are requesting interval lab work from [facility].  Patient states that she is happy where she is located, does not report any shortness of breath.  She states that she is participating in the activities at the facility.  She does use a rolling walker.  She does have chronic leg swelling, using compression hose at this time.  Her weight is down from hospital stay in May.  Past Medical History:  Diagnosis Date  . Anxiety   . Asthma   . Back pain   . Barrett esophagus   . CKD (chronic kidney disease) stage 3, GFR 30-59 ml/min (HCC)   . Depression   . Diastolic heart failure ([employer])   . Essential hypertension   . Fibromyalgia   . GERD (gastroesophageal reflux disease)   . Gout   . History of pericarditis   . Hyperlipidemia   . Nephrolithiasis    2008  . Obesity hypoventilation syndrome (HCC)    CPAP  . PE  (pulmonary embolism)    2010  . Peripheral neuropathy   . Rheumatoid arthritis ([place])   . Schizophrenia ([place])   . Steroid dependence   . Type 2 diabetes mellitus ([place])   . Vitamin D deficiency     Past Surgical History:  Procedure Laterality Date  . APPENDECTOMY    . CHOLECYSTECTOMY    . COLONOSCOPY     benign polyps (12/2000), repeat colonoscopy performed in 2007  . ENDOMETRIAL BIOPSY     10/03 benign columnar mucosa (limited material)  . KIDNEY SURGERY      Current Outpatient Medications  Medication Sig Dispense Refill  . acetaminophen (TYLENOL) 325 MG tablet Take 325 mg by mouth 2 (two) times daily.     [name] Kitchen albuterol (PROVENTIL HFA;VENTOLIN HFA) 108 (90 Base) MCG/ACT inhaler Inhale 2 puffs into the lungs every 6 (six) hours as needed for wheezing or shortness of breath.    [name] Kitchen albuterol (PROVENTIL) (5 MG/ML) 0.5% nebulizer solution Take 2.5 mg by nebulization every 6 (six) hours as needed for wheezing or shortness of breath.    [name] Kitchen aspirin 81 MG tablet Take 81 mg by mouth daily.    [name] Kitchen atorvastatin (LIPITOR) 40 MG tablet Take 40 mg by mouth daily.     . budesonide-formoterol (SYMBICORT) 160-4.5 MCG/ACT inhaler Inhale 2 puffs into the lungs 2 (two) times daily.    . carvedilol (COREG) 25 MG  tablet Take 25 mg by mouth 2 (two) times daily with a meal.    . cholecalciferol (VITAMIN D) 1000 units tablet Take 4,000 Units by mouth daily.    [name] Kitchen docusate sodium (COLACE) 100 MG capsule Take 100 mg by mouth daily.     . DULoxetine (CYMBALTA) 60 MG capsule Take 60 mg by mouth daily.    [name] Kitchen EPINEPHrine (EPI-PEN) 0.3 mg/0.3 mL DEVI Inject 0.3 mg into the muscle once.    . gabapentin (NEURONTIN) 300 MG capsule Take 300 mg by mouth 2 (two) times daily.     [name] Kitchen glipiZIDE (GLUCOTROL XL) 2.5 MG 24 hr tablet Take 2.5 mg by mouth daily with breakfast.    . hydrocortisone 2.5 % ointment Apply 1 application topically daily as needed (itchy skin).    . hydrOXYzine (VISTARIL) 25 MG capsule Take 25 mg by mouth 2  (two) times daily as needed for anxiety.     [name] Kitchen ipratropium (ATROVENT) 0.02 % nebulizer solution Take 0.5 mg by nebulization every 6 (six) hours as needed for wheezing or shortness of breath.     . levothyroxine (SYNTHROID, LEVOTHROID) 25 MCG tablet Take 25 mcg by mouth daily before breakfast.    . linagliptin (TRADJENTA) 5 MG TABS tablet Take 5 mg by mouth daily.    [name] Kitchen loperamide (IMODIUM) 2 MG capsule Take 2 mg by mouth 3 (three) times daily as needed for diarrhea or loose stools.    [name] Kitchen LORazepam (ATIVAN) 1 MG tablet Take 1 mg by mouth daily as needed for anxiety.     [name] Kitchen losartan (COZAAR) 100 MG tablet Take 100 mg by mouth daily.    . montelukast (SINGULAIR) 10 MG tablet Take 10 mg by mouth at bedtime.    . Multiple Vitamin (DAILY VITE PO) Take 1 tablet by mouth daily.    . NON FORMULARY 1 each by Other route See admin instructions. CPAP at bedtime. CPAP setting of 15CM of water    . omeprazole (PRILOSEC) 20 MG capsule Take 20 mg by mouth daily.     . polyethylene glycol (MIRALAX / GLYCOLAX) packet Take 17 g by mouth daily as needed for mild constipation.     . prazosin (MINIPRESS) 2 MG capsule Take 2 mg by mouth at bedtime. For PTSD associated nightmares    . Propylene Glycol (SYSTANE COMPLETE OP) Place 1 drop into both eyes 2 (two) times daily.    [name] Kitchen pyridOXINE (VITAMIN B-6) 100 MG tablet Take 200 mg by mouth 2 (two) times daily.    . risperiDONE (RISPERDAL) 0.5 MG tablet Take 0.5 mg by mouth 2 (two) times daily.     [name] Kitchen spironolactone (ALDACTONE) 25 MG tablet Take 25 mg by mouth daily.    . Talc (BABY POWDER EX) Apply 1 application topically daily as needed (for rash).    . torsemide (DEMADEX) 10 MG tablet Take 20 mg by mouth daily.     . traZODone (DESYREL) 100 MG tablet Take 100 mg by mouth at bedtime.    [name] Kitchen antiseptic oral rinse (BIOTENE) LIQD 15 mLs by Mouth Rinse route daily.     No current facility-administered medications for this visit.    Allergies:  Benztropine mesylate; Codeine;  Diazepam; Diphenhydramine hcl; Penicillins; Sulfonamide derivatives; Thioridazine hcl; and Lisinopril   Social History: The patient  reports that she quit smoking about 45 years ago. Her smoking use included cigarettes. She has never used smokeless tobacco. She reports that she does not drink alcohol or use drugs.   ROS:  Please see the history of present illness. Otherwise, complete review of systems is positive for hearing loss, leg swelling.  All other systems are reviewed and negative.   Physical Exam: VS:  BP (!) 189/78   Pulse 66   Ht 5' (1.524 m)   Wt 216 lb (98 kg)   SpO2 100%   BMI 42.18 kg/m , BMI Body mass index is 42.18 kg/m.  Wt Readings from Last 3 Encounters:  03/16/18 216 lb (98 kg)  01/13/18 220 lb 10.9 oz (100.1 kg)  10/13/16 192 lb 7.4 oz (87.3 kg)    General: Patient appears comfortable at rest.  Using a rolling walker. HEENT: Conjunctiva and lids normal, oropharynx clear. Neck: Supple, no elevated JVP or carotid bruits, no thyromegaly. Lungs: Decreased breath sounds without crackles, nonlabored breathing at rest. Cardiac: Regular rate and rhythm, no S3 or significant systolic murmur, no pericardial rub. Abdomen: Soft, nontender, bowel sounds present. Extremities: Chronic appearing lower leg edema and venous stasis, compression stockings in place, distal pulses 1-2+. Skin: Warm and dry. Musculoskeletal: No kyphosis. Neuropsychiatric: Alert and oriented x3, affect grossly appropriate.  ECG: I personally reviewed the tracing from 01/13/2018 which showed possible ectopic atrial rhythm with right bundle branch block and left anterior fascicular block.  Recent Labwork: 01/12/2018: ALT 33; AST 33; B Natriuretic Peptide 145.3 01/13/2018: BUN 29; Creatinine, Ser 1.79; Hemoglobin 9.5; Platelets 162; Potassium 4.3; Sodium 139     Component Value Date/Time   CHOL 160 11/11/2015 0239   TRIG 192 (H) 11/11/2015 0239   HDL 35 (L) 11/11/2015 0239   CHOLHDL 4.6 11/11/2015  0239   VLDL 38 11/11/2015 0239   LDLCALC 87 11/11/2015 0239   LDLDIRECT 96 02/28/2008 2044    Other Studies Reviewed Today:  Echocardiogram 01/12/2018: Study Conclusions  - Left ventricle: The cavity size was normal. There was mild focal   basal hypertrophy of the septum. Systolic function was normal.   The estimated ejection fraction was in the range of 60% to 65%.   Wall motion was normal; there were no regional wall motion   abnormalities. Doppler parameters are consistent with abnormal   left ventricular relaxation (grade 1 diastolic dysfunction). - Aortic valve: Transvalvular velocity was within the normal range.   There was no stenosis. There was no regurgitation. - Mitral valve: Transvalvular velocity was within the normal range.   There was no evidence for stenosis. There was trivial   regurgitation. - Left atrium: The atrium was moderately dilated. - Right ventricle: The cavity size was normal. Wall thickness was   normal. Systolic function was normal. - Atrial septum: No defect or patent foramen ovale was identified. - Tricuspid valve: There was mild regurgitation. - Pulmonary arteries: Systolic pressure was within the normal   range. PA peak pressure: 36 mm Hg (S). - Pericardium, extracardiac: A small pericardial effusion was   identified. There was a left pleural effusion.  Assessment and Plan:  1.  Chronic diastolic heart failure by history.  Recent echocardiogram in May revealed LVEF 60 to 65% with grade 1 diastolic dysfunction and PASP 36 mmHg.  Weight is down approximately 5 pounds from hospital stay in May on current dose of Demadex.  She has been placed back on losartan and Aldactone by Dr. Woody Seller.  Otherwise continues on Coreg.  I did not make any changes today.  2.  Acute on chronic renal insufficiency due to contrast nephropathy in May of this year.  She was temporarily off losartan and Aldactone, now back  on both medications per Dr. Woody Seller.  We are requesting  interval lab work regarding subsequent renal function.  3.  Uncontrolled hypertension, losartan was just increased from 50 mill grams 200 mg yesterday by Dr. Woody Seller.  Continue to follow at facility and communicate results to PCP for additional management.  She had previously been on hydralazine as well.  4.  Mixed hyperlipidemia, continues on Lipitor.  Current medicines were reviewed with the patient today.  Disposition: Follow-up in 3 months in the [place] office.  Signed, Satira Sark, MD, [facility] 03/16/2018 10:44 AM    [facility] [place], [place], [place] [address] Phone: [phone]; Fax: [phone]

## 2018-03-16 ENCOUNTER — Ambulatory Visit (INDEPENDENT_AMBULATORY_CARE_PROVIDER_SITE_OTHER): Payer: Medicare Other | Admitting: Cardiology

## 2018-03-16 ENCOUNTER — Encounter: Payer: Self-pay | Admitting: Cardiology

## 2018-03-16 VITALS — BP 189/78 | HR 66 | Ht 60.0 in | Wt 216.0 lb

## 2018-03-16 DIAGNOSIS — N183 Chronic kidney disease, stage 3 unspecified: Secondary | ICD-10-CM

## 2018-03-16 DIAGNOSIS — I10 Essential (primary) hypertension: Secondary | ICD-10-CM

## 2018-03-16 DIAGNOSIS — I5032 Chronic diastolic (congestive) heart failure: Secondary | ICD-10-CM

## 2018-03-16 DIAGNOSIS — E782 Mixed hyperlipidemia: Secondary | ICD-10-CM | POA: Diagnosis not present

## 2018-03-16 NOTE — Patient Instructions (Addendum)
Medication Instructions:  Your physician recommends that you continue on your current medications as directed. Please refer to the Current Medication list given to you today.  Labwork: NONE  Testing/Procedures: NONE  Follow-Up: Your physician recommends that you schedule a follow-up appointment in: 3 MONTHS WITH STRADER   Any Other Special Instructions Will Be Listed Below (If Applicable).  If you need a refill on your cardiac medications before your next appointment, please call your pharmacy.

## 2018-03-21 DIAGNOSIS — N390 Urinary tract infection, site not specified: Secondary | ICD-10-CM | POA: Diagnosis not present

## 2018-03-21 DIAGNOSIS — E1165 Type 2 diabetes mellitus with hyperglycemia: Secondary | ICD-10-CM | POA: Diagnosis not present

## 2018-03-21 DIAGNOSIS — I10 Essential (primary) hypertension: Secondary | ICD-10-CM | POA: Diagnosis not present

## 2018-03-21 DIAGNOSIS — Z299 Encounter for prophylactic measures, unspecified: Secondary | ICD-10-CM | POA: Diagnosis not present

## 2018-03-21 DIAGNOSIS — Z6841 Body Mass Index (BMI) 40.0 and over, adult: Secondary | ICD-10-CM | POA: Diagnosis not present

## 2018-03-21 DIAGNOSIS — R350 Frequency of micturition: Secondary | ICD-10-CM | POA: Diagnosis not present

## 2018-03-24 ENCOUNTER — Other Ambulatory Visit: Payer: Self-pay

## 2018-03-24 ENCOUNTER — Emergency Department (HOSPITAL_COMMUNITY)
Admission: EM | Admit: 2018-03-24 | Discharge: 2018-03-24 | Disposition: A | Discharge status: Other Acute Inpatient Hospital | Payer: Medicare Other | Attending: Emergency Medicine | Admitting: Emergency Medicine

## 2018-03-24 ENCOUNTER — Emergency Department (HOSPITAL_COMMUNITY): Payer: Medicare Other

## 2018-03-24 ENCOUNTER — Encounter (HOSPITAL_COMMUNITY): Payer: Self-pay | Admitting: *Deleted

## 2018-03-24 DIAGNOSIS — E1165 Type 2 diabetes mellitus with hyperglycemia: Secondary | ICD-10-CM | POA: Diagnosis not present

## 2018-03-24 DIAGNOSIS — J449 Chronic obstructive pulmonary disease, unspecified: Secondary | ICD-10-CM | POA: Insufficient documentation

## 2018-03-24 DIAGNOSIS — Z87891 Personal history of nicotine dependence: Secondary | ICD-10-CM | POA: Diagnosis not present

## 2018-03-24 DIAGNOSIS — I10 Essential (primary) hypertension: Secondary | ICD-10-CM

## 2018-03-24 DIAGNOSIS — J45909 Unspecified asthma, uncomplicated: Secondary | ICD-10-CM | POA: Diagnosis not present

## 2018-03-24 DIAGNOSIS — R42 Dizziness and giddiness: Secondary | ICD-10-CM | POA: Diagnosis not present

## 2018-03-24 DIAGNOSIS — Z79899 Other long term (current) drug therapy: Secondary | ICD-10-CM | POA: Insufficient documentation

## 2018-03-24 DIAGNOSIS — E119 Type 2 diabetes mellitus without complications: Secondary | ICD-10-CM | POA: Diagnosis not present

## 2018-03-24 DIAGNOSIS — N183 Chronic kidney disease, stage 3 (moderate): Secondary | ICD-10-CM | POA: Insufficient documentation

## 2018-03-24 DIAGNOSIS — I5032 Chronic diastolic (congestive) heart failure: Secondary | ICD-10-CM | POA: Insufficient documentation

## 2018-03-24 DIAGNOSIS — R531 Weakness: Secondary | ICD-10-CM | POA: Diagnosis not present

## 2018-03-24 DIAGNOSIS — I130 Hypertensive heart and chronic kidney disease with heart failure and stage 1 through stage 4 chronic kidney disease, or unspecified chronic kidney disease: Secondary | ICD-10-CM | POA: Diagnosis not present

## 2018-03-24 HISTORY — DX: Other amnesia: R41.3

## 2018-03-24 HISTORY — DX: Post-traumatic stress disorder, unspecified: F43.10

## 2018-03-24 HISTORY — DX: Chronic obstructive pulmonary disease, unspecified: J44.9

## 2018-03-24 HISTORY — DX: Hypothyroidism, unspecified: E03.9

## 2018-03-24 HISTORY — DX: Obstructive sleep apnea (adult) (pediatric): G47.33

## 2018-03-24 HISTORY — DX: Major depressive disorder, single episode, unspecified: F32.9

## 2018-03-24 LAB — CBC WITH DIFFERENTIAL/PLATELET
Basophils Absolute: 0 10*3/uL (ref 0.0–0.1)
Basophils Relative: 1 %
Eosinophils Absolute: 0.2 10*3/uL (ref 0.0–0.7)
Eosinophils Relative: 3 %
HCT: 28.7 % — ABNORMAL LOW (ref 36.0–46.0)
Hemoglobin: 9.8 g/dL — ABNORMAL LOW (ref 12.0–15.0)
Lymphocytes Relative: 16 %
Lymphs Abs: 1.2 10*3/uL (ref 0.7–4.0)
MCH: 32.8 pg (ref 26.0–34.0)
MCHC: 34.1 g/dL (ref 30.0–36.0)
MCV: 96 fL (ref 78.0–100.0)
Monocytes Absolute: 0.5 10*3/uL (ref 0.1–1.0)
Monocytes Relative: 7 %
Neutro Abs: 5.6 10*3/uL (ref 1.7–7.7)
Neutrophils Relative %: 73 %
Platelets: 147 10*3/uL — ABNORMAL LOW (ref 150–400)
RBC: 2.99 MIL/uL — ABNORMAL LOW (ref 3.87–5.11)
RDW: 12.6 % (ref 11.5–15.5)
WBC: 7.6 10*3/uL (ref 4.0–10.5)

## 2018-03-24 LAB — BASIC METABOLIC PANEL
Anion gap: 8 (ref 5–15)
BUN: 39 mg/dL — ABNORMAL HIGH (ref 8–23)
CO2: 25 mmol/L (ref 22–32)
Calcium: 8.8 mg/dL — ABNORMAL LOW (ref 8.9–10.3)
Chloride: 105 mmol/L (ref 98–111)
Creatinine, Ser: 2.21 mg/dL — ABNORMAL HIGH (ref 0.44–1.00)
GFR calc Af Amer: 26 mL/min — ABNORMAL LOW (ref >60)
GFR calc non Af Amer: 22 mL/min — ABNORMAL LOW (ref >60)
Glucose, Bld: 315 mg/dL — ABNORMAL HIGH (ref 70–99)
Potassium: 4.4 mmol/L (ref 3.5–5.1)
Sodium: 138 mmol/L (ref 135–145)

## 2018-03-24 LAB — TROPONIN I: Troponin I: <0.03 ng/mL (ref <0.03)

## 2018-03-24 LAB — CBG MONITORING, ED: Glucose-Capillary: 306 mg/dL — ABNORMAL HIGH (ref 70–99)

## 2018-03-24 NOTE — ED Provider Notes (Signed)
[facility] EMERGENCY DEPARTMENT Provider Note   CSN: [id number] Arrival date & time: 03/24/18  0844     History   Chief Complaint Chief Complaint  Patient presents with  . Hypertension    HPI [name] is a 67 y.o. female.  HPI   [name] is a 67 y.o. female with a history of hypertension, diabetes, chronic kidney disease and CHF, who resides at [facility] assisted living facility and sent to the Emergency Department for evaluation of high blood pressure and dizziness.  Per EMS patient's blood pressure was 202/81 and CBG 329.  Patient states that she has had her blood pressure medications this morning and woke up feeling dizziness that is associated with movement.  She describes a spinning sensation with position change.  She denies headache, visual changes, numbness or weakness, chest pain, shortness of breath, and vomiting.  Patient walks with walker at baseline.  She is currently been treated with Macrodantin for UTI   Past Medical History:  Diagnosis Date  . Anxiety   . Asthma   . Back pain   . Barrett esophagus   . CHF (congestive heart failure) ([place])   . CKD (chronic kidney disease) stage 3, GFR 30-59 ml/min (HCC)   . COPD (chronic obstructive pulmonary disease) ([place])   . Depression   . Diastolic heart failure ([place])   . Essential hypertension   . Fibromyalgia   . GERD (gastroesophageal reflux disease)   . Gout   . History of pericarditis   . Hyperlipidemia   . Hypothyroid   . Major depressive disorder   . Memory loss   . Nephrolithiasis    2008  . Obesity hypoventilation syndrome (HCC)    CPAP  . Obstructive sleep apnea   . PE (pulmonary embolism)    2010  . Peripheral neuropathy   . PTSD (post-traumatic stress disorder)   . Rheumatoid arthritis ([place])   . Schizophrenia ([place])   . Steroid dependence   . Type 2 diabetes mellitus ([place])   . Vitamin D deficiency     Patient Active Problem List   Diagnosis Date Noted  . Acute kidney injury  superimposed on chronic kidney disease ([place]) 01/13/2018  . Altered mental status   . Encephalopathy 10/11/2016  . Chest pain 08/12/2016  . CKD (chronic kidney disease) stage 3, GFR 30-59 ml/min (HCC) 08/12/2016  . History of pulmonary embolus (PE) 08/12/2016  . Obesity hypoventilation syndrome ([place]) 08/12/2016  . RBBB 08/12/2016  . Positive D dimer 08/12/2016  . CVA (cerebral infarction) 11/10/2015  . Physical deconditioning 07/01/2011  . Weakness 07/01/2011  . Diabetic foot ulcer ([place]) 05/19/2011  . Polypharmacy 02/09/2011  . BACK PAIN WITH RADICULOPATHY 05/25/2010  . Type 2 diabetes mellitus with diabetic polyneuropathy, without long-term current use of insulin ([place]) 08/18/2009  . Chronic diastolic heart failure ([place]) 04/25/2009  . Anemia 04/14/2009  . Sleep apnea 12/11/2008  . Diabetic peripheral neuropathy ([place]) 05/04/2007  . HYPERCHOLESTEROLEMIA 10/13/2006  . Gout, unspecified 10/13/2006  . HYPOKALEMIA 10/13/2006  . Schizophrenia ([place]) 10/13/2006  . Depression 10/13/2006  . Essential hypertension 10/13/2006  . Venous (peripheral) insufficiency 10/13/2006  . RHINITIS, ALLERGIC 10/13/2006  . Asthma 10/13/2006  . Reflux esophagitis 10/13/2006  . OSTEOARTHRITIS, MULTI SITES 10/13/2006  . INCONTINENCE, URGE 10/13/2006    Past Surgical History:  Procedure Laterality Date  . APPENDECTOMY    . CHOLECYSTECTOMY    . COLONOSCOPY     benign polyps (12/2000), repeat colonoscopy performed in 2007  . ENDOMETRIAL  BIOPSY     10/03 benign columnar mucosa (limited material)  . KIDNEY SURGERY       OB History   None      Home Medications    Prior to Admission medications   Medication Sig Start Date End Date Taking? Authorizing Provider  acetaminophen (TYLENOL) 325 MG tablet Take 325 mg by mouth 2 (two) times daily.     [provider]  albuterol (PROVENTIL HFA;VENTOLIN HFA) 108 (90 Base) MCG/ACT inhaler Inhale 2 puffs into the lungs every 6 (six) hours as needed for  wheezing or shortness of breath.    [provider]  albuterol (PROVENTIL) (5 MG/ML) 0.5% nebulizer solution Take 2.5 mg by nebulization every 6 (six) hours as needed for wheezing or shortness of breath.    [provider]  antiseptic oral rinse (BIOTENE) LIQD 15 mLs by Mouth Rinse route daily.    [provider]  aspirin 81 MG tablet Take 81 mg by mouth daily.    [provider]  atorvastatin (LIPITOR) 40 MG tablet Take 40 mg by mouth daily.     [provider]  budesonide-formoterol (SYMBICORT) 160-4.5 MCG/ACT inhaler Inhale 2 puffs into the lungs 2 (two) times daily.    [provider]  carvedilol (COREG) 25 MG tablet Take 25 mg by mouth 2 (two) times daily with a meal.    [provider]  cholecalciferol (VITAMIN D) 1000 units tablet Take 4,000 Units by mouth daily.    [provider]  docusate sodium (COLACE) 100 MG capsule Take 100 mg by mouth daily.     [provider]  DULoxetine (CYMBALTA) 60 MG capsule Take 60 mg by mouth daily.    [provider]  EPINEPHrine (EPI-PEN) 0.3 mg/0.3 mL DEVI Inject 0.3 mg into the muscle once.    [provider]  gabapentin (NEURONTIN) 300 MG capsule Take 300 mg by mouth 2 (two) times daily.  04/29/17   [provider]  glipiZIDE (GLUCOTROL XL) 2.5 MG 24 hr tablet Take 2.5 mg by mouth daily with breakfast.    [provider]  hydrocortisone 2.5 % ointment Apply 1 application topically daily as needed (itchy skin).    [provider]  hydrOXYzine (VISTARIL) 25 MG capsule Take 25 mg by mouth 2 (two) times daily as needed for anxiety.     [provider]  ipratropium (ATROVENT) 0.02 % nebulizer solution Take 0.5 mg by nebulization every 6 (six) hours as needed for wheezing or shortness of breath.     [provider]  levothyroxine (SYNTHROID, LEVOTHROID) 25 MCG tablet Take 25 mcg by mouth daily before breakfast.     [provider]  linagliptin (TRADJENTA) 5 MG TABS tablet Take 5 mg by mouth daily.    [provider]  loperamide (IMODIUM) 2 MG capsule Take 2 mg by mouth 3 (three) times daily as needed for diarrhea or loose stools.    [provider]  LORazepam (ATIVAN) 1 MG tablet Take 1 mg by mouth daily as needed for anxiety.     [provider]  losartan (COZAAR) 100 MG tablet Take 100 mg by mouth daily.    [provider]  montelukast (SINGULAIR) 10 MG tablet Take 10 mg by mouth at bedtime.    [provider]  Multiple Vitamin (DAILY VITE PO) Take 1 tablet by mouth daily.    [provider]  NON FORMULARY 1 each by Other route See admin instructions. CPAP at bedtime.  CPAP setting of 15CM of water    [provider]  omeprazole (PRILOSEC) 20 MG capsule Take 20 mg by mouth daily.     [provider]  polyethylene glycol (MIRALAX / GLYCOLAX) packet Take 17 g by mouth daily as needed for mild constipation.  02/07/17   [provider]  prazosin (MINIPRESS) 2 MG capsule Take 2 mg by mouth at bedtime. For PTSD associated nightmares    [provider]  Propylene Glycol (SYSTANE COMPLETE OP) Place 1 drop into both eyes 2 (two) times daily.    [provider]  pyridOXINE (VITAMIN B-6) 100 MG tablet Take 200 mg by mouth 2 (two) times daily.    [provider]  risperiDONE (RISPERDAL) 0.5 MG tablet Take 0.5 mg by mouth 2 (two) times daily.     [provider]  spironolactone (ALDACTONE) 25 MG tablet Take 25 mg by mouth daily.    [provider]  Talc (BABY POWDER EX) Apply 1 application topically daily as needed (for rash).    [provider]  torsemide (DEMADEX) 10 MG tablet Take 20 mg by mouth daily.  04/29/17   [provider]  traZODone (DESYREL) 100 MG tablet Take 100 mg by mouth at bedtime.    [provider]    Family History Family History  Problem  Relation Age of Onset  . Bone cancer Mother   . Hypertension Mother   . Heart disease Mother   . COPD Father   . Heart disease Father   . Stroke Father     Social History Social History   Tobacco Use  . Smoking status: Former Smoker    Types: Cigarettes    Last attempt to quit: 06/23/1972    Years since quitting: 45.7  . Smokeless tobacco: Never Used  Substance Use Topics  . Alcohol use: No  . Drug use: No     Allergies   Benztropine mesylate; Codeine; Diazepam; Diphenhydramine hcl; Penicillins; Sulfonamide derivatives; Thioridazine hcl; and Lisinopril   Review of Systems Review of Systems  Constitutional: Negative for appetite change, chills and fever.  HENT: Negative for sore throat and trouble swallowing.   Eyes: Negative for visual disturbance.  Respiratory: Negative for cough, chest tightness, shortness of breath and wheezing.   Cardiovascular: Negative for chest pain and leg swelling.  Gastrointestinal: Negative for abdominal distention, abdominal pain, nausea and vomiting.  Musculoskeletal: Negative for arthralgias, back pain, neck pain and neck stiffness.  Skin: Negative for rash.  Neurological: Positive for dizziness. Negative for syncope, facial asymmetry, speech difficulty, weakness, numbness and headaches.  Psychiatric/Behavioral: Negative for confusion.     Physical Exam Updated Vital Signs BP (!) 217/97 (BP Location: Left Arm)   Pulse 70   Temp 97.6 F (36.4 C) (Oral)   Resp 14   Ht 5' (1.524 m)   Wt 98 kg   SpO2 99%   BMI 42.19 kg/m   Physical Exam  Constitutional: She is oriented to person, place, and time. She appears well-nourished. No distress.  HENT:  Head: Atraumatic.  Mouth/Throat: Oropharynx is clear and moist.  Eyes: Pupils are equal, round, and reactive to light. Conjunctivae and EOM are normal.  Neck: Normal range of motion. No JVD present.  Cardiovascular: Normal rate and intact distal pulses.  Murmur heard. Pulmonary/Chest:  Effort normal and breath sounds normal. No respiratory distress. She exhibits no tenderness.  Abdominal: Soft. She exhibits no distension. There is no tenderness. There is no guarding.  Musculoskeletal: She  exhibits no edema.  Neurological: She is alert and oriented to person, place, and time. She has normal strength. No sensory deficit. GCS eye subscore is 4. GCS verbal subscore is 5. GCS motor subscore is 6.  CN II-XII intact.  Speech clear, no pronator drift.  Finger-nose testing.  Follows commands appropriately.  Skin: Skin is warm. Capillary refill takes less than 2 seconds. No rash noted.  Psychiatric: She has a normal mood and affect.  Nursing note and vitals reviewed.    ED Treatments / Results  Labs (all labs ordered are listed, but only abnormal results are displayed) Labs Reviewed  BASIC METABOLIC PANEL - Abnormal; Notable for the following components:      Result Value   Glucose, Bld 315 (*)    BUN 39 (*)    Creatinine, Ser 2.21 (*)    Calcium 8.8 (*)    GFR calc non Af Amer 22 (*)    GFR calc Af Amer 26 (*)    All other components within normal limits  CBC WITH DIFFERENTIAL/PLATELET - Abnormal; Notable for the following components:   RBC 2.99 (*)    Hemoglobin 9.8 (*)    HCT 28.7 (*)    Platelets 147 (*)    All other components within normal limits  CBG MONITORING, ED - Abnormal; Notable for the following components:   Glucose-Capillary 306 (*)    All other components within normal limits  TROPONIN I    EKG EKG Interpretation  Date/Time:  Friday March 24 2018 08:53:23 EDT Ventricular Rate:  76 PR Interval:    QRS Duration: 169 QT Interval:  444 QTC Calculation: 500 R Axis:   -54 Text Interpretation:  Sinus rhythm RBBB and LAFB Left ventricular hypertrophy Confirmed by Nat Christen [id number]) on 03/24/2018 10:29:06 AM   Radiology Dg Chest Portable 1 View  Result Date: 03/24/2018 CLINICAL DATA:  Dizziness, hypertension. EXAM: PORTABLE CHEST 1 VIEW COMPARISON:   Radiographs of Jan 12, 2018. FINDINGS: Stable cardiomegaly. No pneumothorax or pleural effusion is noted. No acute pulmonary disease is noted. Bony thorax is unremarkable. IMPRESSION: No acute cardiopulmonary abnormality seen. Electronically Signed   By: Marijo Conception, M.D.   On: 03/24/2018 10:04    Procedures Procedures (including critical care time)  Medications Ordered in ED Medications - No data to display   Initial Impression / Assessment and Plan / ED Course  I have reviewed the triage vital signs and the nursing notes.  Pertinent labs & imaging results that were available during my care of the patient were reviewed by me and considered in my medical decision making (see chart for details).     Pt also seen by Dr. Lacinda Axon and care plan discussed.   Labs reviewed.  Patient well-appearing, alert.  Nontoxic.  Vitals reviewed.  Systolic pressure elevated, but stable.  Doubt ACS.  Patient does have chronic kidney disease.  Blood sugar elevated, no concerning sx's for DKA.  Anion gap wnml. Complained of dizziness initially, but now denies dizziness.  NV intact.  No focal neuro deficits.  Appears stable for discharge back to the facility  Final Clinical Impressions(s) / ED Diagnoses   Final diagnoses:  Essential hypertension    ED Discharge Orders    None       Kem Parkinson, PA-C 03/24/18 1120    Nat Christen, MD 03/25/18 1224

## 2018-03-24 NOTE — Discharge Instructions (Signed)
Continue her current medications.  Follow-up with her primary doctor for recheck

## 2018-03-24 NOTE — ED Triage Notes (Addendum)
Pt brought in by [facility] from [facility] for high blood pressure. Pt's BP was 202/81 for EMS. Pt's blood sugar was also elevated at 329. Pt is currently being treated for UTI. Pt has received her medications this morning. Pt does report dizziness that started this morning.

## 2018-03-28 DIAGNOSIS — F29 Unspecified psychosis not due to a substance or known physiological condition: Secondary | ICD-10-CM | POA: Diagnosis not present

## 2018-03-29 DIAGNOSIS — Z299 Encounter for prophylactic measures, unspecified: Secondary | ICD-10-CM | POA: Diagnosis not present

## 2018-03-29 DIAGNOSIS — F209 Schizophrenia, unspecified: Secondary | ICD-10-CM | POA: Diagnosis not present

## 2018-03-29 DIAGNOSIS — N183 Chronic kidney disease, stage 3 (moderate): Secondary | ICD-10-CM | POA: Diagnosis not present

## 2018-03-29 DIAGNOSIS — J449 Chronic obstructive pulmonary disease, unspecified: Secondary | ICD-10-CM | POA: Diagnosis not present

## 2018-03-29 DIAGNOSIS — E1122 Type 2 diabetes mellitus with diabetic chronic kidney disease: Secondary | ICD-10-CM | POA: Diagnosis not present

## 2018-03-29 DIAGNOSIS — Z6841 Body Mass Index (BMI) 40.0 and over, adult: Secondary | ICD-10-CM | POA: Diagnosis not present

## 2018-03-29 DIAGNOSIS — I10 Essential (primary) hypertension: Secondary | ICD-10-CM | POA: Diagnosis not present

## 2018-04-04 ENCOUNTER — Emergency Department (HOSPITAL_COMMUNITY)
Admission: EM | Admit: 2018-04-04 | Discharge: 2018-04-05 | Disposition: A | Discharge status: Skilled Nursing Facility | Payer: Medicare Other | Attending: Emergency Medicine | Admitting: Emergency Medicine

## 2018-04-04 ENCOUNTER — Encounter (HOSPITAL_COMMUNITY): Payer: Self-pay

## 2018-04-04 ENCOUNTER — Other Ambulatory Visit: Payer: Self-pay

## 2018-04-04 DIAGNOSIS — J449 Chronic obstructive pulmonary disease, unspecified: Secondary | ICD-10-CM | POA: Diagnosis not present

## 2018-04-04 DIAGNOSIS — R0902 Hypoxemia: Secondary | ICD-10-CM | POA: Diagnosis not present

## 2018-04-04 DIAGNOSIS — M79604 Pain in right leg: Secondary | ICD-10-CM | POA: Diagnosis not present

## 2018-04-04 DIAGNOSIS — L03115 Cellulitis of right lower limb: Secondary | ICD-10-CM | POA: Diagnosis not present

## 2018-04-04 DIAGNOSIS — R609 Edema, unspecified: Secondary | ICD-10-CM | POA: Diagnosis not present

## 2018-04-04 DIAGNOSIS — E1122 Type 2 diabetes mellitus with diabetic chronic kidney disease: Secondary | ICD-10-CM | POA: Insufficient documentation

## 2018-04-04 DIAGNOSIS — Z7984 Long term (current) use of oral hypoglycemic drugs: Secondary | ICD-10-CM | POA: Diagnosis not present

## 2018-04-04 DIAGNOSIS — I5032 Chronic diastolic (congestive) heart failure: Secondary | ICD-10-CM | POA: Diagnosis not present

## 2018-04-04 DIAGNOSIS — N183 Chronic kidney disease, stage 3 (moderate): Secondary | ICD-10-CM | POA: Diagnosis not present

## 2018-04-04 DIAGNOSIS — Z7982 Long term (current) use of aspirin: Secondary | ICD-10-CM | POA: Diagnosis not present

## 2018-04-04 DIAGNOSIS — L03116 Cellulitis of left lower limb: Secondary | ICD-10-CM | POA: Insufficient documentation

## 2018-04-04 DIAGNOSIS — M79605 Pain in left leg: Secondary | ICD-10-CM | POA: Diagnosis not present

## 2018-04-04 DIAGNOSIS — I130 Hypertensive heart and chronic kidney disease with heart failure and stage 1 through stage 4 chronic kidney disease, or unspecified chronic kidney disease: Secondary | ICD-10-CM | POA: Insufficient documentation

## 2018-04-04 DIAGNOSIS — I10 Essential (primary) hypertension: Secondary | ICD-10-CM | POA: Diagnosis not present

## 2018-04-04 DIAGNOSIS — E1142 Type 2 diabetes mellitus with diabetic polyneuropathy: Secondary | ICD-10-CM | POA: Insufficient documentation

## 2018-04-04 DIAGNOSIS — L03119 Cellulitis of unspecified part of limb: Secondary | ICD-10-CM

## 2018-04-04 DIAGNOSIS — M7989 Other specified soft tissue disorders: Secondary | ICD-10-CM | POA: Diagnosis not present

## 2018-04-04 DIAGNOSIS — Z87891 Personal history of nicotine dependence: Secondary | ICD-10-CM | POA: Insufficient documentation

## 2018-04-04 DIAGNOSIS — Z79899 Other long term (current) drug therapy: Secondary | ICD-10-CM | POA: Insufficient documentation

## 2018-04-04 LAB — BASIC METABOLIC PANEL
Anion gap: 7 (ref 5–15)
BUN: 42 mg/dL — ABNORMAL HIGH (ref 8–23)
CO2: 22 mmol/L (ref 22–32)
Calcium: 8.3 mg/dL — ABNORMAL LOW (ref 8.9–10.3)
Chloride: 109 mmol/L (ref 98–111)
Creatinine, Ser: 2.39 mg/dL — ABNORMAL HIGH (ref 0.44–1.00)
GFR calc Af Amer: 23 mL/min — ABNORMAL LOW (ref >60)
GFR calc non Af Amer: 20 mL/min — ABNORMAL LOW (ref >60)
Glucose, Bld: 264 mg/dL — ABNORMAL HIGH (ref 70–99)
Potassium: 3.7 mmol/L (ref 3.5–5.1)
Sodium: 138 mmol/L (ref 135–145)

## 2018-04-04 LAB — LACTIC ACID, PLASMA
Lactic Acid, Venous: 1.5 mmol/L (ref 0.5–1.9)
Lactic Acid, Venous: 1.1 mmol/L (ref 0.5–1.9)

## 2018-04-04 LAB — CBC WITH DIFFERENTIAL/PLATELET
Basophils Absolute: 0 10*3/uL (ref 0.0–0.1)
Basophils Relative: 1 %
Eosinophils Absolute: 0.2 10*3/uL (ref 0.0–0.7)
Eosinophils Relative: 3 %
HCT: 26.3 % — ABNORMAL LOW (ref 36.0–46.0)
Hemoglobin: 9 g/dL — ABNORMAL LOW (ref 12.0–15.0)
Lymphocytes Relative: 19 %
Lymphs Abs: 1.6 10*3/uL (ref 0.7–4.0)
MCH: 32.7 pg (ref 26.0–34.0)
MCHC: 34.2 g/dL (ref 30.0–36.0)
MCV: 95.6 fL (ref 78.0–100.0)
Monocytes Absolute: 0.5 10*3/uL (ref 0.1–1.0)
Monocytes Relative: 6 %
Neutro Abs: 6.1 10*3/uL (ref 1.7–7.7)
Neutrophils Relative %: 71 %
Platelets: 170 10*3/uL (ref 150–400)
RBC: 2.75 MIL/uL — ABNORMAL LOW (ref 3.87–5.11)
RDW: 13 % (ref 11.5–15.5)
WBC: 8.5 10*3/uL (ref 4.0–10.5)

## 2018-04-04 MED ORDER — DOXYCYCLINE HYCLATE 100 MG PO TABS
100.0000 mg | ORAL_TABLET | Freq: Once | ORAL | Status: AC
  Administered 2018-04-04: 100 mg via ORAL
  Filled 2018-04-04: qty 1

## 2018-04-04 MED ORDER — DOXYCYCLINE HYCLATE 100 MG PO CAPS: 100.0000 mg | ORAL_CAPSULE | Freq: Two times a day (BID) | ORAL | 0 refills | Status: DC

## 2018-04-04 MED ORDER — MORPHINE SULFATE (PF) 2 MG/ML IV SOLN
2.0000 mg | Freq: Once | INTRAVENOUS | Status: AC
  Administered 2018-04-04: 2 mg via INTRAVENOUS
  Filled 2018-04-04: qty 1

## 2018-04-04 NOTE — Discharge Instructions (Signed)
Take Doxycycline twice daily for infection Return if worsening

## 2018-04-04 NOTE — ED Provider Notes (Signed)
[facility] EMERGENCY DEPARTMENT Provider Note   CSN: [id number] Arrival date & time: 04/04/18  2011     History   Chief Complaint Chief Complaint  Patient presents with  . Leg Pain    HPI [name] is a 67 y.o. female who presents with bilateral leg pain. PMH significant for HTN, diabetes, CKD, COPD, CHF, schizophrenia, hx of PE who resides at [facility] assisted living facility. She states that since today, her legs have become very painful. She initially thought it may be related to her heart because she has CHF and sometimes her legs get swollen. She usually walks with a walker at her facility but states that she hasn't been able to do this because it's very painful to walk from her feet to her knees. She denies any trauma to the legs or falls. She has been taking her meds. She denies fever, chest pain, SOB, abdominal pain, N/V/D, dysuria. She was recently treated with antibiotics for a UTI and states those symptoms are better now.  HPI  Past Medical History:  Diagnosis Date  . Anxiety   . Asthma   . Back pain   . Barrett esophagus   . CHF (congestive heart failure) ([place])   . CKD (chronic kidney disease) stage 3, GFR 30-59 ml/min (HCC)   . COPD (chronic obstructive pulmonary disease) ([place])   . Depression   . Diastolic heart failure ([place])   . Essential hypertension   . Fibromyalgia   . GERD (gastroesophageal reflux disease)   . Gout   . History of pericarditis   . Hyperlipidemia   . Hypothyroid   . Major depressive disorder   . Memory loss   . Nephrolithiasis    2008  . Obesity hypoventilation syndrome (HCC)    CPAP  . Obstructive sleep apnea   . PE (pulmonary embolism)    2010  . Peripheral neuropathy   . PTSD (post-traumatic stress disorder)   . Rheumatoid arthritis ([place])   . Schizophrenia ([place])   . Steroid dependence   . Type 2 diabetes mellitus ([place])   . Vitamin D deficiency     Patient Active Problem List   Diagnosis Date Noted  . Acute kidney  injury superimposed on chronic kidney disease ([place]) 01/13/2018  . Altered mental status   . Encephalopathy 10/11/2016  . Chest pain 08/12/2016  . CKD (chronic kidney disease) stage 3, GFR 30-59 ml/min (HCC) 08/12/2016  . History of pulmonary embolus (PE) 08/12/2016  . Obesity hypoventilation syndrome ([place]) 08/12/2016  . RBBB 08/12/2016  . Positive D dimer 08/12/2016  . CVA (cerebral infarction) 11/10/2015  . Physical deconditioning 07/01/2011  . Weakness 07/01/2011  . Diabetic foot ulcer ([place]) 05/19/2011  . Polypharmacy 02/09/2011  . BACK PAIN WITH RADICULOPATHY 05/25/2010  . Type 2 diabetes mellitus with diabetic polyneuropathy, without long-term current use of insulin ([place]) 08/18/2009  . Chronic diastolic heart failure ([place]) 04/25/2009  . Anemia 04/14/2009  . Sleep apnea 12/11/2008  . Diabetic peripheral neuropathy ([place]) 05/04/2007  . HYPERCHOLESTEROLEMIA 10/13/2006  . Gout, unspecified 10/13/2006  . HYPOKALEMIA 10/13/2006  . Schizophrenia ([place]) 10/13/2006  . Depression 10/13/2006  . Essential hypertension 10/13/2006  . Venous (peripheral) insufficiency 10/13/2006  . RHINITIS, ALLERGIC 10/13/2006  . Asthma 10/13/2006  . Reflux esophagitis 10/13/2006  . OSTEOARTHRITIS, MULTI SITES 10/13/2006  . INCONTINENCE, URGE 10/13/2006    Past Surgical History:  Procedure Laterality Date  . APPENDECTOMY    . CHOLECYSTECTOMY    . COLONOSCOPY  benign polyps (12/2000), repeat colonoscopy performed in 2007  . ENDOMETRIAL BIOPSY     10/03 benign columnar mucosa (limited material)  . KIDNEY SURGERY       OB History   None      Home Medications    Prior to Admission medications   Medication Sig Start Date End Date Taking? Authorizing Provider  acetaminophen (TYLENOL) 325 MG tablet Take 325 mg by mouth 2 (two) times daily.    Yes [provider]  albuterol (PROVENTIL HFA;VENTOLIN HFA) 108 (90 Base) MCG/ACT inhaler Inhale 2 puffs into the lungs every 6 (six) hours as  needed for wheezing or shortness of breath.   Yes [provider]  albuterol (PROVENTIL) (2.5 MG/3ML) 0.083% nebulizer solution Take 2.5 mg by nebulization every 6 (six) hours as needed for wheezing or shortness of breath.   Yes [provider]  aspirin 81 MG tablet Take 81 mg by mouth daily.   Yes [provider]  atorvastatin (LIPITOR) 40 MG tablet Take 40 mg by mouth daily.    Yes [provider]  budesonide-formoterol (SYMBICORT) 160-4.5 MCG/ACT inhaler Inhale 2 puffs into the lungs 2 (two) times daily.   Yes [provider]  carvedilol (COREG) 25 MG tablet Take 25 mg by mouth 2 (two) times daily with a meal.   Yes [provider]  Cholecalciferol (VITAMIN D) 2000 units CAPS Take 4,000 Units by mouth daily.    Yes [provider]  docusate sodium (COLACE) 100 MG capsule Take 100 mg by mouth daily.    Yes [provider]  DULoxetine (CYMBALTA) 60 MG capsule Take 60 mg by mouth daily.   Yes [provider]  EPINEPHrine (EPI-PEN) 0.3 mg/0.3 mL DEVI Inject 0.3 mg into the muscle once.   Yes [provider]  gabapentin (NEURONTIN) 300 MG capsule Take 300-600 mg by mouth See admin instructions. 300mg  twice daily and 600mg  at bedtime 04/29/17  Yes [provider]  glipiZIDE (GLUCOTROL XL) 2.5 MG 24 hr tablet Take 2.5 mg by mouth daily with breakfast.   Yes [provider]  hydrocortisone 2.5 % ointment Apply 1 application topically daily as needed (itchy skin).   Yes [provider]  hydrOXYzine (VISTARIL) 25 MG capsule Take 25 mg by mouth 2 (two) times daily as needed for anxiety.    Yes [provider]  ipratropium (ATROVENT) 0.02 % nebulizer solution Take 0.5 mg by nebulization every 6 (six) hours as needed for wheezing or shortness of breath.    Yes [provider]  levothyroxine (SYNTHROID, LEVOTHROID) 25 MCG tablet Take 25 mcg by mouth daily before breakfast.   Yes  [provider]  linagliptin (TRADJENTA) 5 MG TABS tablet Take 5 mg by mouth daily.   Yes [provider]  loperamide (IMODIUM) 2 MG capsule Take 2 mg by mouth every 8 (eight) hours as needed for diarrhea or loose stools.    Yes [provider]  LORazepam (ATIVAN) 1 MG tablet Take 1 mg by mouth daily as needed for anxiety.    Yes [provider]  losartan (COZAAR) 100 MG tablet Take 100 mg by mouth daily.   Yes [provider]  montelukast (SINGULAIR) 10 MG tablet Take 10 mg by mouth at bedtime.   Yes [provider]  Multiple Vitamin (DAILY VITE PO) Take 1 tablet by mouth daily.   Yes [provider]  NON FORMULARY 1 each by Other route See admin instructions. CPAP at bedtime. CPAP  setting of 15CM of water   Yes [provider]  omeprazole (PRILOSEC) 20 MG capsule Take 20 mg by mouth daily.    Yes [provider]  prazosin (MINIPRESS) 2 MG capsule Take 2 mg by mouth at bedtime. For PTSD associated nightmares   Yes [provider]  pyridOXINE (VITAMIN B-6) 100 MG tablet Take 200 mg by mouth 2 (two) times daily.   Yes [provider]  risperiDONE (RISPERDAL) 0.5 MG tablet Take 0.5 mg by mouth 2 (two) times daily.    Yes [provider]  spironolactone (ALDACTONE) 25 MG tablet Take 25 mg by mouth daily.   Yes [provider]  torsemide (DEMADEX) 10 MG tablet Take 20 mg by mouth daily.  04/29/17  Yes [provider]  traZODone (DESYREL) 100 MG tablet Take 100 mg by mouth at bedtime as needed for sleep.    Yes [provider]    Family History Family History  Problem Relation Age of Onset  . Bone cancer Mother   . Hypertension Mother   . Heart disease Mother   . COPD Father   . Heart disease Father   . Stroke Father     Social History Social History   Tobacco Use  . Smoking status: Former Smoker    Types: Cigarettes    Last attempt to quit: 06/23/1972     Years since quitting: 45.8  . Smokeless tobacco: Never Used  Substance Use Topics  . Alcohol use: No  . Drug use: No     Allergies   Benztropine mesylate; Codeine; Diazepam; Diphenhydramine hcl; Penicillins; Sulfonamide derivatives; Thioridazine hcl; and Lisinopril   Review of Systems Review of Systems  Constitutional: Negative for chills and fever.  Respiratory: Negative for shortness of breath.   Cardiovascular: Positive for leg swelling. Negative for chest pain.  Gastrointestinal: Negative for abdominal pain, diarrhea, nausea and vomiting.  Genitourinary: Negative for dysuria.  Musculoskeletal: Positive for arthralgias. Negative for joint swelling.  Skin: Positive for color change. Negative for wound.  All other systems reviewed and are negative.    Physical Exam Updated Vital Signs BP (!) 187/77 (BP Location: Right Arm)   Pulse 74   Temp 98 F (36.7 C) (Oral)   Resp 20   Ht 5' (1.524 m)   Wt 99.8 kg   SpO2 95%   BMI 42.97 kg/m   Physical Exam  Constitutional: She is oriented to person, place, and time. She appears well-developed and well-nourished. No distress.  Calm, cooperative. Chronically ill appearing. Obese  HENT:  Head: Normocephalic and atraumatic.  Eyes: Pupils are equal, round, and reactive to light. Conjunctivae are normal. Right eye exhibits no discharge. Left eye exhibits no discharge. No scleral icterus.  Neck: Normal range of motion.  Cardiovascular: Normal rate and regular rhythm.  Murmur heard. Pulmonary/Chest: Effort normal and breath sounds normal. No respiratory distress.  Abdominal: Soft. Bowel sounds are normal. She exhibits no distension. There is no tenderness.  Musculoskeletal:  2+ pitting edema bilaterally of lower extremities. There is erythema from her ankles to knees bilaterally. Skin is erythematous, tender, and warm to touch. No obvious wounds.  Neurological: She is alert and oriented to person, place, and time.  Skin: Skin is  warm and dry.  Psychiatric: She has a normal mood and affect. Her behavior is normal.  Nursing note and vitals reviewed.      ED Treatments / Results  Labs (all labs ordered are listed, but only abnormal results are displayed)  Labs Reviewed  BASIC METABOLIC PANEL - Abnormal; Notable for the following components:      Result Value   Glucose, Bld 264 (*)    BUN 42 (*)    Creatinine, Ser 2.39 (*)    Calcium 8.3 (*)    GFR calc non Af Amer 20 (*)    GFR calc Af Amer 23 (*)    All other components within normal limits  CBC WITH DIFFERENTIAL/PLATELET - Abnormal; Notable for the following components:   RBC 2.75 (*)    Hemoglobin 9.0 (*)    HCT 26.3 (*)    All other components within normal limits  LACTIC ACID, PLASMA  LACTIC ACID, PLASMA    EKG None  Radiology No results found.  Procedures Procedures (including critical care time)  Medications Ordered in ED Medications  morphine 2 MG/ML injection 2 mg (2 mg Intravenous Given 04/04/18 2120)  doxycycline (VIBRA-TABS) tablet 100 mg (100 mg Oral Given 04/04/18 2253)     Initial Impression / Assessment and Plan / ED Course  I have reviewed the triage vital signs and the nursing notes.  Pertinent labs & imaging results that were available during my care of the patient were reviewed by me and considered in my medical decision making (see chart for details).  67 year old female presents with with bilateral leg pain, erythema, warmth of the bilateral lower extremities concerning for possible cellulitis. She is hypertensive but otherwise vitals are normal. Her exam is remarkable for red, warm, tender lower extremities. Unclear if this represents cellulitis vs chronic venous stasis changes however will cover with antibiotics. Her CBC is remarkable for anemia which is around her baseline. No leukocytosis. BMP is remarkable for hyperglycemia and elevated SCr which is also around her baseline. Lactic acid is normal. Shared visit with Dr.  Lacinda Axon. Will tx with Doxy due to her severe PCN allergy and d/c back to SNF.  Final Clinical Impressions(s) / ED Diagnoses   Final diagnoses:  Cellulitis of lower extremity, unspecified laterality    ED Discharge Orders    None       Recardo Evangelist, PA-C 04/04/18 2334    Nat Christen, MD 04/06/18 1538

## 2018-04-04 NOTE — ED Notes (Signed)
Pt is asleep

## 2018-04-04 NOTE — ED Triage Notes (Signed)
Pt arrived from [facility], facility states pts legs turned red and warm to touch suddenly, pt states legs have been red and painful for days and are now to the point where she is unable to ambulate. A&O x4. Pt states she has neuropathy in both lower distal extremities.

## 2018-04-06 ENCOUNTER — Emergency Department (HOSPITAL_COMMUNITY): Payer: Medicare Other

## 2018-04-06 ENCOUNTER — Encounter (HOSPITAL_COMMUNITY): Payer: Self-pay

## 2018-04-06 ENCOUNTER — Emergency Department (HOSPITAL_COMMUNITY)
Admission: EM | Admit: 2018-04-06 | Discharge: 2018-04-07 | Disposition: A | Discharge status: Other Acute Inpatient Hospital | Payer: Medicare Other | Attending: Emergency Medicine | Admitting: Emergency Medicine

## 2018-04-06 DIAGNOSIS — Y9301 Activity, walking, marching and hiking: Secondary | ICD-10-CM | POA: Insufficient documentation

## 2018-04-06 DIAGNOSIS — S0003XA Contusion of scalp, initial encounter: Secondary | ICD-10-CM

## 2018-04-06 DIAGNOSIS — M7989 Other specified soft tissue disorders: Secondary | ICD-10-CM | POA: Diagnosis not present

## 2018-04-06 DIAGNOSIS — I130 Hypertensive heart and chronic kidney disease with heart failure and stage 1 through stage 4 chronic kidney disease, or unspecified chronic kidney disease: Secondary | ICD-10-CM | POA: Insufficient documentation

## 2018-04-06 DIAGNOSIS — Y999 Unspecified external cause status: Secondary | ICD-10-CM | POA: Insufficient documentation

## 2018-04-06 DIAGNOSIS — E039 Hypothyroidism, unspecified: Secondary | ICD-10-CM | POA: Insufficient documentation

## 2018-04-06 DIAGNOSIS — S0990XA Unspecified injury of head, initial encounter: Secondary | ICD-10-CM | POA: Diagnosis not present

## 2018-04-06 DIAGNOSIS — W0110XA Fall on same level from slipping, tripping and stumbling with subsequent striking against unspecified object, initial encounter: Secondary | ICD-10-CM | POA: Diagnosis not present

## 2018-04-06 DIAGNOSIS — S40012A Contusion of left shoulder, initial encounter: Secondary | ICD-10-CM | POA: Diagnosis not present

## 2018-04-06 DIAGNOSIS — S4992XA Unspecified injury of left shoulder and upper arm, initial encounter: Secondary | ICD-10-CM | POA: Diagnosis not present

## 2018-04-06 DIAGNOSIS — S59912A Unspecified injury of left forearm, initial encounter: Secondary | ICD-10-CM | POA: Diagnosis not present

## 2018-04-06 DIAGNOSIS — S5012XA Contusion of left forearm, initial encounter: Secondary | ICD-10-CM | POA: Insufficient documentation

## 2018-04-06 DIAGNOSIS — Y9210 Unspecified residential institution as the place of occurrence of the external cause: Secondary | ICD-10-CM | POA: Diagnosis not present

## 2018-04-06 DIAGNOSIS — W19XXXA Unspecified fall, initial encounter: Secondary | ICD-10-CM | POA: Diagnosis not present

## 2018-04-06 DIAGNOSIS — N183 Chronic kidney disease, stage 3 (moderate): Secondary | ICD-10-CM | POA: Diagnosis not present

## 2018-04-06 DIAGNOSIS — T148XXA Other injury of unspecified body region, initial encounter: Secondary | ICD-10-CM | POA: Diagnosis not present

## 2018-04-06 DIAGNOSIS — Y92009 Unspecified place in unspecified non-institutional (private) residence as the place of occurrence of the external cause: Secondary | ICD-10-CM

## 2018-04-06 DIAGNOSIS — M25512 Pain in left shoulder: Secondary | ICD-10-CM | POA: Diagnosis not present

## 2018-04-06 DIAGNOSIS — I5032 Chronic diastolic (congestive) heart failure: Secondary | ICD-10-CM | POA: Diagnosis not present

## 2018-04-06 DIAGNOSIS — Z87891 Personal history of nicotine dependence: Secondary | ICD-10-CM | POA: Insufficient documentation

## 2018-04-06 DIAGNOSIS — Z7982 Long term (current) use of aspirin: Secondary | ICD-10-CM | POA: Diagnosis not present

## 2018-04-06 DIAGNOSIS — S199XXA Unspecified injury of neck, initial encounter: Secondary | ICD-10-CM | POA: Diagnosis not present

## 2018-04-06 DIAGNOSIS — J45909 Unspecified asthma, uncomplicated: Secondary | ICD-10-CM | POA: Insufficient documentation

## 2018-04-06 NOTE — Discharge Instructions (Addendum)
Apply ice to sore areas as needed. Take acetaminophen or ibuprofen as needed for pain.

## 2018-04-06 NOTE — ED Provider Notes (Signed)
[facility] EMERGENCY DEPARTMENT Provider Note   CSN: [id number] Arrival date & time: 04/06/18  2245     History   Chief Complaint Chief Complaint  Patient presents with  . Fall    HPI [name] is a 67 y.o. female.  The history is provided by the patient.  Fall   She has history of hypertension, diabetes, hyperlipidemia, asthma, diastolic heart failure, chronic kidney disease, schizophrenia and comes in after falling in her bathroom.  She is complaining of pain in her left shoulder, left forearm and also states that she hit the back of her head.  She denies loss of consciousness.  She denies nausea or vomiting.  She rates her pain at 8/10.  She is not on any anticoagulants.  Past Medical History:  Diagnosis Date  . Anxiety   . Asthma   . Back pain   . Barrett esophagus   . CHF (congestive heart failure) ([place])   . CKD (chronic kidney disease) stage 3, GFR 30-59 ml/min (HCC)   . COPD (chronic obstructive pulmonary disease) ([place])   . Depression   . Diastolic heart failure ([place])   . Essential hypertension   . Fibromyalgia   . GERD (gastroesophageal reflux disease)   . Gout   . History of pericarditis   . Hyperlipidemia   . Hypothyroid   . Major depressive disorder   . Memory loss   . Nephrolithiasis    2008  . Obesity hypoventilation syndrome (HCC)    CPAP  . Obstructive sleep apnea   . PE (pulmonary embolism)    2010  . Peripheral neuropathy   . PTSD (post-traumatic stress disorder)   . Rheumatoid arthritis ([place])   . Schizophrenia ([place])   . Steroid dependence   . Type 2 diabetes mellitus ([place])   . Vitamin D deficiency     Patient Active Problem List   Diagnosis Date Noted  . Acute kidney injury superimposed on chronic kidney disease ([place]) 01/13/2018  . Altered mental status   . Encephalopathy 10/11/2016  . Chest pain 08/12/2016  . CKD (chronic kidney disease) stage 3, GFR 30-59 ml/min (HCC) 08/12/2016  . History of pulmonary embolus (PE)  08/12/2016  . Obesity hypoventilation syndrome ([place]) 08/12/2016  . RBBB 08/12/2016  . Positive D dimer 08/12/2016  . CVA (cerebral infarction) 11/10/2015  . Physical deconditioning 07/01/2011  . Weakness 07/01/2011  . Diabetic foot ulcer ([place]) 05/19/2011  . Polypharmacy 02/09/2011  . BACK PAIN WITH RADICULOPATHY 05/25/2010  . Type 2 diabetes mellitus with diabetic polyneuropathy, without long-term current use of insulin ([place]) 08/18/2009  . Chronic diastolic heart failure ([place]) 04/25/2009  . Anemia 04/14/2009  . Sleep apnea 12/11/2008  . Diabetic peripheral neuropathy ([place]) 05/04/2007  . HYPERCHOLESTEROLEMIA 10/13/2006  . Gout, unspecified 10/13/2006  . HYPOKALEMIA 10/13/2006  . Schizophrenia ([place]) 10/13/2006  . Depression 10/13/2006  . Essential hypertension 10/13/2006  . Venous (peripheral) insufficiency 10/13/2006  . RHINITIS, ALLERGIC 10/13/2006  . Asthma 10/13/2006  . Reflux esophagitis 10/13/2006  . OSTEOARTHRITIS, MULTI SITES 10/13/2006  . INCONTINENCE, URGE 10/13/2006    Past Surgical History:  Procedure Laterality Date  . APPENDECTOMY    . CHOLECYSTECTOMY    . COLONOSCOPY     benign polyps (12/2000), repeat colonoscopy performed in 2007  . ENDOMETRIAL BIOPSY     10/03 benign columnar mucosa (limited material)  . KIDNEY SURGERY       OB History   None      Home Medications    Prior  to Admission medications   Medication Sig Start Date End Date Taking? Authorizing Provider  acetaminophen (TYLENOL) 325 MG tablet Take 325 mg by mouth 2 (two) times daily.     [provider]  albuterol (PROVENTIL HFA;VENTOLIN HFA) 108 (90 Base) MCG/ACT inhaler Inhale 2 puffs into the lungs every 6 (six) hours as needed for wheezing or shortness of breath.    [provider]  albuterol (PROVENTIL) (2.5 MG/3ML) 0.083% nebulizer solution Take 2.5 mg by nebulization every 6 (six) hours as needed for wheezing or shortness of breath.    [provider]    aspirin 81 MG tablet Take 81 mg by mouth daily.    [provider]  atorvastatin (LIPITOR) 40 MG tablet Take 40 mg by mouth daily.     [provider]  budesonide-formoterol (SYMBICORT) 160-4.5 MCG/ACT inhaler Inhale 2 puffs into the lungs 2 (two) times daily.    [provider]  carvedilol (COREG) 25 MG tablet Take 25 mg by mouth 2 (two) times daily with a meal.    [provider]  Cholecalciferol (VITAMIN D) 2000 units CAPS Take 4,000 Units by mouth daily.     [provider]  docusate sodium (COLACE) 100 MG capsule Take 100 mg by mouth daily.     [provider]  doxycycline (VIBRAMYCIN) 100 MG capsule Take 1 capsule (100 mg total) by mouth 2 (two) times daily. 04/04/18   Recardo Evangelist, PA-C  DULoxetine (CYMBALTA) 60 MG capsule Take 60 mg by mouth daily.    [provider]  EPINEPHrine (EPI-PEN) 0.3 mg/0.3 mL DEVI Inject 0.3 mg into the muscle once.    [provider]  gabapentin (NEURONTIN) 300 MG capsule Take 300-600 mg by mouth See admin instructions. 300mg  twice daily and 600mg  at bedtime 04/29/17   [provider]  glipiZIDE (GLUCOTROL XL) 2.5 MG 24 hr tablet Take 2.5 mg by mouth daily with breakfast.    [provider]  hydrocortisone 2.5 % ointment Apply 1 application topically daily as needed (itchy skin).    [provider]  hydrOXYzine (VISTARIL) 25 MG capsule Take 25 mg by mouth 2 (two) times daily as needed for anxiety.     [provider]  ipratropium (ATROVENT) 0.02 % nebulizer solution Take 0.5 mg by nebulization every 6 (six) hours as needed for wheezing or shortness of breath.     [provider]  levothyroxine (SYNTHROID, LEVOTHROID) 25 MCG tablet Take 25 mcg by mouth daily before breakfast.    [provider]  linagliptin (TRADJENTA) 5 MG TABS tablet Take 5 mg by mouth daily.    [provider]  loperamide (IMODIUM) 2 MG capsule Take 2 mg by  mouth every 8 (eight) hours as needed for diarrhea or loose stools.     [provider]  LORazepam (ATIVAN) 1 MG tablet Take 1 mg by mouth daily as needed for anxiety.     [provider]  losartan (COZAAR) 100 MG tablet Take 100 mg by mouth daily.    [provider]  montelukast (SINGULAIR) 10 MG tablet Take 10 mg by mouth at bedtime.    [provider]  Multiple Vitamin (DAILY VITE PO) Take 1 tablet by mouth daily.    [provider]  NON FORMULARY 1 each by Other route See admin instructions. CPAP at bedtime. CPAP setting of 15CM of water    [provider]  omeprazole (PRILOSEC) 20 MG capsule Take 20 mg by mouth  daily.     [provider]  prazosin (MINIPRESS) 2 MG capsule Take 2 mg by mouth at bedtime. For PTSD associated nightmares    [provider]  pyridOXINE (VITAMIN B-6) 100 MG tablet Take 200 mg by mouth 2 (two) times daily.    [provider]  risperiDONE (RISPERDAL) 0.5 MG tablet Take 0.5 mg by mouth 2 (two) times daily.     [provider]  spironolactone (ALDACTONE) 25 MG tablet Take 25 mg by mouth daily.    [provider]  torsemide (DEMADEX) 10 MG tablet Take 20 mg by mouth daily.  04/29/17   [provider]  traZODone (DESYREL) 100 MG tablet Take 100 mg by mouth at bedtime as needed for sleep.     [provider]    Family History Family History  Problem Relation Age of Onset  . Bone cancer Mother   . Hypertension Mother   . Heart disease Mother   . COPD Father   . Heart disease Father   . Stroke Father     Social History Social History   Tobacco Use  . Smoking status: Former Smoker    Types: Cigarettes    Last attempt to quit: 06/23/1972    Years since quitting: 45.8  . Smokeless tobacco: Never Used  Substance Use Topics  . Alcohol use: No  . Drug use: No     Allergies   Benztropine mesylate; Codeine; Diazepam; Diphenhydramine hcl;  Penicillins; Sulfonamide derivatives; Thioridazine hcl; and Lisinopril   Review of Systems Review of Systems  All other systems reviewed and are negative.    Physical Exam Updated Vital Signs BP (!) 161/76 (BP Location: Right Arm)   Pulse 73   Temp 98.1 F (36.7 C) (Oral)   Resp 18   Ht 5' (1.524 m)   Wt 100.7 kg   SpO2 100%   BMI 43.36 kg/m   Physical Exam  Nursing note and vitals reviewed.  67 year old female, resting comfortably and in no acute distress. Vital signs are significant for elevated systolic blood pressure. Oxygen saturation is 100%, which is normal. Head is normocephalic.  Small hematoma present in the occiput. PERRLA, EOMI. Oropharynx is clear. Neck is nontender without adenopathy or JVD. Back is nontender and there is no CVA tenderness. Lungs are clear without rales, wheezes, or rhonchi. Chest is nontender. Heart has regular rate and rhythm without murmur. Abdomen is soft, flat, nontender without masses or hepatosplenomegaly and peristalsis is normoactive. Extremities have 2+ edema.  Hematoma present on the left forearm ulnar aspect with tenderness in the same area.  There is moderate tenderness to palpation rather diffusely throughout the left shoulder with pain on passive range of motion.  No tenderness to palpation in the upper arm or elbow.  Distal neurovascular exam is intact. Skin is warm and dry without rash. Neurologic: Mental status is normal, cranial nerves are intact, there are no motor or sensory deficits.  ED Treatments / Results   Radiology Dg Forearm Left  Result Date: 04/06/2018 CLINICAL DATA:  Fall with forearm pain EXAM: LEFT FOREARM - 2 VIEW COMPARISON:  None. FINDINGS: No fracture or malalignment. Soft tissue swelling dorsal aspect of the distal forearm. No radiopaque foreign body IMPRESSION: No acute osseous abnormality. Soft tissue swelling dorsal aspect of the distal forearm Electronically Signed   By: Donavan Foil M.D.   On:  04/06/2018 23:41   Ct Head Wo Contrast  Result Date: 04/06/2018 CLINICAL DATA:  [place] in  bathroom hit back of head EXAM: CT HEAD WITHOUT CONTRAST CT CERVICAL SPINE WITHOUT CONTRAST TECHNIQUE: Multidetector CT imaging of the head and cervical spine was performed following the standard protocol without intravenous contrast. Multiplanar CT image reconstructions of the cervical spine were also generated. COMPARISON:  CT brain 01/12/2018 FINDINGS: CT HEAD FINDINGS Brain: No acute territorial infarction, hemorrhage or intracranial mass. Mild atrophy. Mild small vessel ischemic changes of the white matter. Chronic lacunar infarcts within the left greater than right basal ganglia. Vascular: No hyperdense vessels.  Carotid vascular calcification. Skull: Normal. Negative for fracture or focal lesion. Sinuses/Orbits: No acute finding. Other: None CT CERVICAL SPINE FINDINGS Alignment: No subluxation.  Facet alignment within normal limits Skull base and vertebrae: No acute fracture. No primary bone lesion or focal pathologic process. Soft tissues and spinal canal: No prevertebral fluid or swelling. No visible canal hematoma. Disc levels:  Mild degenerative changes C4-C5, C5-C6 and C6-C7. Upper chest: Negative. Other: None IMPRESSION: 1. No CT evidence for acute intracranial abnormality. Atrophy and mild small vessel ischemic changes of the white matter 2. No acute osseous abnormality of the cervical spine Electronically Signed   By: Donavan Foil M.D.   On: 04/06/2018 23:50   Ct Cervical Spine Wo Contrast  Result Date: 04/06/2018 CLINICAL DATA:  [place] in bathroom hit back of head EXAM: CT HEAD WITHOUT CONTRAST CT CERVICAL SPINE WITHOUT CONTRAST TECHNIQUE: Multidetector CT imaging of the head and cervical spine was performed following the standard protocol without intravenous contrast. Multiplanar CT image reconstructions of the cervical spine were also generated. COMPARISON:  CT brain 01/12/2018 FINDINGS: CT HEAD FINDINGS  Brain: No acute territorial infarction, hemorrhage or intracranial mass. Mild atrophy. Mild small vessel ischemic changes of the white matter. Chronic lacunar infarcts within the left greater than right basal ganglia. Vascular: No hyperdense vessels.  Carotid vascular calcification. Skull: Normal. Negative for fracture or focal lesion. Sinuses/Orbits: No acute finding. Other: None CT CERVICAL SPINE FINDINGS Alignment: No subluxation.  Facet alignment within normal limits Skull base and vertebrae: No acute fracture. No primary bone lesion or focal pathologic process. Soft tissues and spinal canal: No prevertebral fluid or swelling. No visible canal hematoma. Disc levels:  Mild degenerative changes C4-C5, C5-C6 and C6-C7. Upper chest: Negative. Other: None IMPRESSION: 1. No CT evidence for acute intracranial abnormality. Atrophy and mild small vessel ischemic changes of the white matter 2. No acute osseous abnormality of the cervical spine Electronically Signed   By: Donavan Foil M.D.   On: 04/06/2018 23:50   Dg Shoulder Left  Result Date: 04/06/2018 CLINICAL DATA:  Fall with shoulder pain EXAM: LEFT SHOULDER - 2+ VIEW COMPARISON:  Chest x-ray 03/24/2018 FINDINGS: [facility] joint shows mild degenerative change. High-riding humeral head consistent with rotator cuff disease. No acute fracture or dislocation. IMPRESSION: 1. No acute osseous abnormality. 2. High-riding humeral head consistent with rotator cuff disease Electronically Signed   By: Donavan Foil M.D.   On: 04/06/2018 23:40    Procedures Procedures   Medications Ordered in ED Medications - No data to display   Initial Impression / Assessment and Plan / ED Course  I have reviewed the triage vital signs and the nursing notes.  Pertinent imaging results that were available during my care of the patient were reviewed by me and considered in my medical decision making (see chart for details).  Fall with injury to left forearm.  Because of blunt head  trauma, she will be sent for CT of head and cervical spine.  X-rays will be obtained of forearm and shoulder.  Old records are reviewed, and she has no relevant past visits.  X-rays and CT scan showed no acute injury.  She is advised on ice and elevation of injured areas, told to use over-the-counter analgesics as needed for pain.  Follow-up with PCP as needed.  Final Clinical Impressions(s) / ED Diagnoses   Final diagnoses:  Fall at home, initial encounter  Contusion of occipital region of scalp, initial encounter  Contusion of left shoulder, initial encounter  Contusion of left forearm, initial encounter    ED Discharge Orders    None       Delora Fuel, MD 67/25/50 0000

## 2018-04-06 NOTE — ED Triage Notes (Signed)
Pt states she fell in the bathroom at [facility], states one of the railings broke and caused her to fall to the floor and onto her left arm.  Pt states she also hit the back of her head on the floor.  Pt denies loc.  Pt has bruising to left forearm and to knuckle on left hand.

## 2018-04-07 DIAGNOSIS — R279 Unspecified lack of coordination: Secondary | ICD-10-CM | POA: Diagnosis not present

## 2018-04-07 DIAGNOSIS — W19XXXA Unspecified fall, initial encounter: Secondary | ICD-10-CM | POA: Diagnosis not present

## 2018-04-07 DIAGNOSIS — Z7401 Bed confinement status: Secondary | ICD-10-CM | POA: Diagnosis not present

## 2018-04-07 MED ORDER — ACETAMINOPHEN 325 MG PO TABS
ORAL_TABLET | ORAL | Status: AC
  Filled 2018-04-07: qty 2

## 2018-04-07 MED ORDER — ACETAMINOPHEN 325 MG PO TABS
650.0000 mg | ORAL_TABLET | Freq: Once | ORAL | Status: AC
  Administered 2018-04-07: 650 mg via ORAL

## 2018-04-11 DIAGNOSIS — M79674 Pain in right toe(s): Secondary | ICD-10-CM | POA: Diagnosis not present

## 2018-04-11 DIAGNOSIS — B351 Tinea unguium: Secondary | ICD-10-CM | POA: Diagnosis not present

## 2018-04-11 DIAGNOSIS — M79675 Pain in left toe(s): Secondary | ICD-10-CM | POA: Diagnosis not present

## 2018-04-12 ENCOUNTER — Encounter (HOSPITAL_COMMUNITY): Payer: Self-pay

## 2018-04-12 ENCOUNTER — Emergency Department (HOSPITAL_COMMUNITY)
Admission: EM | Admit: 2018-04-12 | Discharge: 2018-04-12 | Disposition: A | Discharge status: Home / Self Care | Payer: Medicare Other | Attending: Emergency Medicine | Admitting: Emergency Medicine

## 2018-04-12 ENCOUNTER — Emergency Department (HOSPITAL_COMMUNITY): Payer: Medicare Other

## 2018-04-12 DIAGNOSIS — E1142 Type 2 diabetes mellitus with diabetic polyneuropathy: Secondary | ICD-10-CM | POA: Diagnosis not present

## 2018-04-12 DIAGNOSIS — I130 Hypertensive heart and chronic kidney disease with heart failure and stage 1 through stage 4 chronic kidney disease, or unspecified chronic kidney disease: Secondary | ICD-10-CM | POA: Insufficient documentation

## 2018-04-12 DIAGNOSIS — I509 Heart failure, unspecified: Secondary | ICD-10-CM | POA: Insufficient documentation

## 2018-04-12 DIAGNOSIS — Z87891 Personal history of nicotine dependence: Secondary | ICD-10-CM | POA: Insufficient documentation

## 2018-04-12 DIAGNOSIS — J449 Chronic obstructive pulmonary disease, unspecified: Secondary | ICD-10-CM | POA: Insufficient documentation

## 2018-04-12 DIAGNOSIS — N183 Chronic kidney disease, stage 3 (moderate): Secondary | ICD-10-CM | POA: Insufficient documentation

## 2018-04-12 DIAGNOSIS — E1122 Type 2 diabetes mellitus with diabetic chronic kidney disease: Secondary | ICD-10-CM | POA: Diagnosis not present

## 2018-04-12 DIAGNOSIS — Z7984 Long term (current) use of oral hypoglycemic drugs: Secondary | ICD-10-CM | POA: Diagnosis not present

## 2018-04-12 DIAGNOSIS — I10 Essential (primary) hypertension: Secondary | ICD-10-CM

## 2018-04-12 DIAGNOSIS — E039 Hypothyroidism, unspecified: Secondary | ICD-10-CM | POA: Insufficient documentation

## 2018-04-12 DIAGNOSIS — J45901 Unspecified asthma with (acute) exacerbation: Secondary | ICD-10-CM | POA: Diagnosis not present

## 2018-04-12 LAB — URINALYSIS, ROUTINE W REFLEX MICROSCOPIC
Bacteria, UA: NONE SEEN
Bilirubin Urine: NEGATIVE
Glucose, UA: 50 mg/dL — AB
Hgb urine dipstick: NEGATIVE
Ketones, ur: NEGATIVE mg/dL
Leukocytes, UA: NEGATIVE
Nitrite: NEGATIVE
Protein, ur: 100 mg/dL — AB
Specific Gravity, Urine: 1.006 (ref 1.005–1.030)
pH: 5 (ref 5.0–8.0)

## 2018-04-12 LAB — I-STAT CHEM 8, ED
BUN: 50 mg/dL — ABNORMAL HIGH (ref 8–23)
Calcium, Ion: 1.23 mmol/L (ref 1.15–1.40)
Chloride: 106 mmol/L (ref 98–111)
Creatinine, Ser: 2.3 mg/dL — ABNORMAL HIGH (ref 0.44–1.00)
Glucose, Bld: 227 mg/dL — ABNORMAL HIGH (ref 70–99)
HCT: 26 % — ABNORMAL LOW (ref 36.0–46.0)
Hemoglobin: 8.8 g/dL — ABNORMAL LOW (ref 12.0–15.0)
Potassium: 4.2 mmol/L (ref 3.5–5.1)
Sodium: 138 mmol/L (ref 135–145)
TCO2: 23 mmol/L (ref 22–32)

## 2018-04-12 MED ORDER — SPIRONOLACTONE 25 MG PO TABS: 25.0000 mg | ORAL_TABLET | Freq: Every day | ORAL | 0 refills | Status: DC

## 2018-04-12 NOTE — ED Provider Notes (Signed)
Emergency Department Provider Note   I have reviewed the triage vital signs and the nursing notes.   HISTORY  Chief Complaint Hypertension   HPI [name] is a 67 y.o. female with multiple medical problems as documented below presents the emergency department today because of hypertension found her facility.  Patient states only symptom she has is a little bit of hearing her pulse in her ears which makes it more difficult here my voice but no hearing changes.  No visual changes.  No chest pain, shortness of breath, abdominal pain, urinary changes or other associated symptoms.  No headaches.  Review of the records it appears she supposed to still be on spironolactone and her medication ministration record from the facility does not reflect that. No other associated or modifying symptoms.    Past Medical History:  Diagnosis Date  . Anxiety   . Asthma   . Back pain   . Barrett esophagus   . CHF (congestive heart failure) ([place])   . CKD (chronic kidney disease) stage 3, GFR 30-59 ml/min (HCC)   . COPD (chronic obstructive pulmonary disease) ([place])   . Depression   . Diastolic heart failure ([place])   . Essential hypertension   . Fibromyalgia   . GERD (gastroesophageal reflux disease)   . Gout   . History of pericarditis   . Hyperlipidemia   . Hypothyroid   . Major depressive disorder   . Memory loss   . Nephrolithiasis    2008  . Obesity hypoventilation syndrome (HCC)    CPAP  . Obstructive sleep apnea   . PE (pulmonary embolism)    2010  . Peripheral neuropathy   . PTSD (post-traumatic stress disorder)   . Rheumatoid arthritis ([place])   . Schizophrenia ([place])   . Steroid dependence   . Type 2 diabetes mellitus ([place])   . Vitamin D deficiency     Patient Active Problem List   Diagnosis Date Noted  . Acute kidney injury superimposed on chronic kidney disease ([place]) 01/13/2018  . Altered mental status   . Encephalopathy 10/11/2016  . Chest pain 08/12/2016  . CKD  (chronic kidney disease) stage 3, GFR 30-59 ml/min (HCC) 08/12/2016  . History of pulmonary embolus (PE) 08/12/2016  . Obesity hypoventilation syndrome ([place]) 08/12/2016  . RBBB 08/12/2016  . Positive D dimer 08/12/2016  . CVA (cerebral infarction) 11/10/2015  . Physical deconditioning 07/01/2011  . Weakness 07/01/2011  . Diabetic foot ulcer ([place]) 05/19/2011  . Polypharmacy 02/09/2011  . BACK PAIN WITH RADICULOPATHY 05/25/2010  . Type 2 diabetes mellitus with diabetic polyneuropathy, without long-term current use of insulin ([place]) 08/18/2009  . Chronic diastolic heart failure ([place]) 04/25/2009  . Anemia 04/14/2009  . Sleep apnea 12/11/2008  . Diabetic peripheral neuropathy ([place]) 05/04/2007  . HYPERCHOLESTEROLEMIA 10/13/2006  . Gout, unspecified 10/13/2006  . HYPOKALEMIA 10/13/2006  . Schizophrenia ([place]) 10/13/2006  . Depression 10/13/2006  . Essential hypertension 10/13/2006  . Venous (peripheral) insufficiency 10/13/2006  . RHINITIS, ALLERGIC 10/13/2006  . Asthma 10/13/2006  . Reflux esophagitis 10/13/2006  . OSTEOARTHRITIS, MULTI SITES 10/13/2006  . INCONTINENCE, URGE 10/13/2006    Past Surgical History:  Procedure Laterality Date  . APPENDECTOMY    . CHOLECYSTECTOMY    . COLONOSCOPY     benign polyps (12/2000), repeat colonoscopy performed in 2007  . ENDOMETRIAL BIOPSY     10/03 benign columnar mucosa (limited material)  . KIDNEY SURGERY      Current Outpatient Rx  . Order #: [id number] Class:  Historical Med  . Order #: [id number] Class: Historical Med  . Order #: [id number] Class: Historical Med  . Order #: [id number] Class: Historical Med  . Order #: [id number] Class: Historical Med  . Order #: [id number] Class: Historical Med  . Order #: [id number] Class: Historical Med  . Order #: [id number] Class: Print  . Order #: [id number] Class: Historical Med  . Order #: [id number] Class: Historical Med  . Order #: [id number] Class: Historical Med  . Order #: [id number] Class: Historical Med  .  Order #: [id number] Class: Historical Med  . Order #: [id number] Class: Historical Med  . Order #: [id number] Class: Historical Med  . Order #: [id number] Class: Historical Med  . Order #: [id number] Class: Historical Med  . Order #: [id number] Class: Historical Med  . Order #: [id number] Class: Historical Med  . Order #: [id number] Class: Historical Med  . Order #: [id number] Class: Historical Med  . Order #: [id number] Class: Historical Med  . Order #: [id number] Class: Historical Med  . Order #: [id number] Class: Historical Med  . Order #: [id number] Class: Historical Med  . Order #: [id number] Class: Historical Med  . Order #: [id number] Class: Historical Med  . Order #: [id number] Class: Historical Med  . Order #: [id number] Class: Historical Med  . Order #: [id number] Class: Historical Med  . Order #: [id number] Class: Print  . Order #: [id number] Class: Historical Med    Allergies Benztropine mesylate; Codeine; Diazepam; Diphenhydramine hcl; Penicillins; Sulfonamide derivatives; Thioridazine hcl; and Lisinopril  Family History  Problem Relation Age of Onset  . Bone cancer Mother   . Hypertension Mother   . Heart disease Mother   . COPD Father   . Heart disease Father   . Stroke Father     Social History Social History   Tobacco Use  . Smoking status: Former Smoker    Types: Cigarettes    Last attempt to quit: 06/23/1972    Years since quitting: 45.8  . Smokeless tobacco: Never Used  Substance Use Topics  . Alcohol use: No  . Drug use: No    Review of Systems  All other systems negative except as documented in the HPI. All pertinent positives and negatives as reviewed in the HPI. ____________________________________________   PHYSICAL EXAM:  VITAL SIGNS: ED Triage Vitals  Enc Vitals Group     BP      Pulse      Resp      Temp      Temp src      SpO2      Weight      Height      Head Circumference      Peak Flow      Pain Score      Pain Loc      Pain Edu?      Excl. in [place]?      Constitutional: Alert and oriented. Well appearing and in no acute distress. Eyes: Conjunctivae are normal. PERRL. EOMI. Head: Atraumatic. Nose: No congestion/rhinnorhea. Mouth/Throat: Mucous membranes are moist.  Oropharynx non-erythematous. Neck: No stridor.  No meningeal signs.   Cardiovascular: Normal rate, regular rhythm. Good peripheral circulation. Grossly normal heart sounds.   Respiratory: Normal respiratory effort.  No retractions. Lungs CTAB. Gastrointestinal: Soft and nontender. No distention.  Musculoskeletal: No lower extremity tenderness nor edema. No gross deformities of extremities. Neurologic:  Normal speech and language. No gross focal neurologic deficits are appreciated. No altered mental status, able to give full seemingly accurate history.  Face is symmetric, EOM's intact, pupils equal and reactive, vision intact, tongue and uvula midline without deviation. Upper and Lower extremity motor  5/5 with major muscle groups, bilateral feet patient does not want to comply with exam, intact pain perception in distal extremities, 2+ reflexes in biceps, patella and achilles tendons. Able to perform finger to nose normal with both hands. Walks without assistance or evident ataxia.  Skin:  Skin is warm, dry and intact. No rash noted.  ____________________________________________   LABS (all labs ordered are listed, but only abnormal results are displayed)  Labs Reviewed  URINALYSIS, ROUTINE W REFLEX MICROSCOPIC - Abnormal; Notable for the following components:      Result Value   Color, Urine STRAW (*)    Glucose, UA 50 (*)    Protein, ur 100 (*)    All other components within normal limits  I-STAT CHEM 8, ED - Abnormal; Notable for the following components:   BUN 50 (*)    Creatinine, Ser 2.30 (*)    Glucose, Bld 227 (*)    Hemoglobin 8.8 (*)    HCT 26.0 (*)    All other components within normal limits    ____________________________________________  RADIOLOGY  Ct Head Wo Contrast  Result Date: 04/12/2018 CLINICAL DATA:  Hypertension today. EXAM: CT HEAD WITHOUT CONTRAST TECHNIQUE: Contiguous axial images were obtained from the base of the skull through the vertex without intravenous contrast. COMPARISON:  Head CT scan 04/06/2018 and 01/22/2017. FINDINGS: Brain: No evidence of acute infarction, hemorrhage, hydrocephalus, extra-axial collection or mass lesion/mass effect. Vascular: No hyperdense vessel or unexpected calcification. Skull: Intact.  No focal lesion. Sinuses/Orbits: Status post bilateral cataract surgery. Otherwise negative Other: None. IMPRESSION: Negative head CT. Electronically Signed   By: Inge Rise M.D.   On: 04/12/2018 12:19    ____________________________________________   PROCEDURES  Procedure(s) performed:   Procedures   ____________________________________________   INITIAL IMPRESSION / ASSESSMENT AND PLAN / ED COURSE  Basically asymptomatic HTN. Will restart her aldactone and recommend PCP follow up.     Pertinent labs & imaging results that were available during my care of the patient were reviewed by me and considered in my medical decision making (see chart for details).  ____________________________________________  FINAL CLINICAL IMPRESSION(S) / ED DIAGNOSES  Final diagnoses:  Hypertension, unspecified type     MEDICATIONS GIVEN DURING THIS VISIT:  Medications - No data to display   NEW OUTPATIENT MEDICATIONS STARTED DURING THIS VISIT:  Discharge Medication List as of 04/12/2018  1:02 PM      Note:  This note was prepared with assistance of Dragon voice recognition software. Occasional wrong-word or sound-a-like substitutions may have occurred due to the inherent limitations of voice recognition software.   Merrily Pew, MD 04/12/18 1504

## 2018-04-12 NOTE — ED Triage Notes (Signed)
Pt sent from [facility] due to BP 220/112 this morning .Pt states her head "feels distant". Staff reports problems with BP frequently

## 2018-04-12 NOTE — Discharge Instructions (Addendum)
We generally do not treat asymptomatic hypertension in the emergency room however I noticed that you are supposed to be taking this medication and it does not appear in your MAR.  A prescription for 1 months worth has been given but you need to follow-up with your primary doctor for further management of your chronic medical problems.

## 2018-04-12 NOTE — ED Notes (Signed)
Report given to Butch Penny at [facility] at this time. [facility] to send staff to pick up pt.

## 2018-04-12 NOTE — ED Notes (Signed)
Patient ambulatory to restroom with assistance

## 2018-04-14 DIAGNOSIS — E1142 Type 2 diabetes mellitus with diabetic polyneuropathy: Secondary | ICD-10-CM | POA: Diagnosis not present

## 2018-04-14 DIAGNOSIS — J45901 Unspecified asthma with (acute) exacerbation: Secondary | ICD-10-CM | POA: Diagnosis not present

## 2018-04-14 DIAGNOSIS — I130 Hypertensive heart and chronic kidney disease with heart failure and stage 1 through stage 4 chronic kidney disease, or unspecified chronic kidney disease: Secondary | ICD-10-CM | POA: Diagnosis not present

## 2018-04-14 DIAGNOSIS — N183 Chronic kidney disease, stage 3 (moderate): Secondary | ICD-10-CM | POA: Diagnosis not present

## 2018-04-14 DIAGNOSIS — E1122 Type 2 diabetes mellitus with diabetic chronic kidney disease: Secondary | ICD-10-CM | POA: Diagnosis not present

## 2018-04-14 DIAGNOSIS — Z7984 Long term (current) use of oral hypoglycemic drugs: Secondary | ICD-10-CM | POA: Diagnosis not present

## 2018-04-18 ENCOUNTER — Inpatient Hospital Stay (HOSPITAL_COMMUNITY)
Admission: EM | Admit: 2018-04-18 | Discharge: 2018-04-21 | DRG: 071 | Disposition: A | Discharge status: Home Health | Payer: Medicare Other | Source: Skilled Nursing Facility | Attending: Internal Medicine | Admitting: Internal Medicine

## 2018-04-18 ENCOUNTER — Emergency Department (HOSPITAL_COMMUNITY): Payer: Medicare Other

## 2018-04-18 ENCOUNTER — Encounter (HOSPITAL_COMMUNITY): Payer: Self-pay | Admitting: Emergency Medicine

## 2018-04-18 ENCOUNTER — Other Ambulatory Visit: Payer: Self-pay

## 2018-04-18 DIAGNOSIS — F4310 Post-traumatic stress disorder, unspecified: Secondary | ICD-10-CM | POA: Diagnosis not present

## 2018-04-18 DIAGNOSIS — I130 Hypertensive heart and chronic kidney disease with heart failure and stage 1 through stage 4 chronic kidney disease, or unspecified chronic kidney disease: Secondary | ICD-10-CM | POA: Diagnosis present

## 2018-04-18 DIAGNOSIS — E039 Hypothyroidism, unspecified: Secondary | ICD-10-CM | POA: Diagnosis present

## 2018-04-18 DIAGNOSIS — Z7984 Long term (current) use of oral hypoglycemic drugs: Secondary | ICD-10-CM | POA: Diagnosis not present

## 2018-04-18 DIAGNOSIS — Z7952 Long term (current) use of systemic steroids: Secondary | ICD-10-CM | POA: Diagnosis not present

## 2018-04-18 DIAGNOSIS — Z79899 Other long term (current) drug therapy: Secondary | ICD-10-CM | POA: Diagnosis not present

## 2018-04-18 DIAGNOSIS — D649 Anemia, unspecified: Secondary | ICD-10-CM | POA: Diagnosis not present

## 2018-04-18 DIAGNOSIS — E1151 Type 2 diabetes mellitus with diabetic peripheral angiopathy without gangrene: Secondary | ICD-10-CM | POA: Diagnosis present

## 2018-04-18 DIAGNOSIS — E785 Hyperlipidemia, unspecified: Secondary | ICD-10-CM | POA: Diagnosis not present

## 2018-04-18 DIAGNOSIS — R4182 Altered mental status, unspecified: Secondary | ICD-10-CM | POA: Diagnosis present

## 2018-04-18 DIAGNOSIS — Z79890 Hormone replacement therapy (postmenopausal): Secondary | ICD-10-CM

## 2018-04-18 DIAGNOSIS — E119 Type 2 diabetes mellitus without complications: Secondary | ICD-10-CM

## 2018-04-18 DIAGNOSIS — I5032 Chronic diastolic (congestive) heart failure: Secondary | ICD-10-CM | POA: Diagnosis not present

## 2018-04-18 DIAGNOSIS — E1142 Type 2 diabetes mellitus with diabetic polyneuropathy: Secondary | ICD-10-CM | POA: Diagnosis not present

## 2018-04-18 DIAGNOSIS — F209 Schizophrenia, unspecified: Secondary | ICD-10-CM | POA: Diagnosis present

## 2018-04-18 DIAGNOSIS — Z6841 Body Mass Index (BMI) 40.0 and over, adult: Secondary | ICD-10-CM | POA: Diagnosis not present

## 2018-04-18 DIAGNOSIS — I4510 Unspecified right bundle-branch block: Secondary | ICD-10-CM | POA: Diagnosis not present

## 2018-04-18 DIAGNOSIS — J449 Chronic obstructive pulmonary disease, unspecified: Secondary | ICD-10-CM | POA: Diagnosis not present

## 2018-04-18 DIAGNOSIS — Z86711 Personal history of pulmonary embolism: Secondary | ICD-10-CM

## 2018-04-18 DIAGNOSIS — R4020 Unspecified coma: Secondary | ICD-10-CM | POA: Diagnosis not present

## 2018-04-18 DIAGNOSIS — M069 Rheumatoid arthritis, unspecified: Secondary | ICD-10-CM | POA: Diagnosis not present

## 2018-04-18 DIAGNOSIS — R404 Transient alteration of awareness: Secondary | ICD-10-CM | POA: Diagnosis not present

## 2018-04-18 DIAGNOSIS — I517 Cardiomegaly: Secondary | ICD-10-CM | POA: Diagnosis not present

## 2018-04-18 DIAGNOSIS — N183 Chronic kidney disease, stage 3 (moderate): Secondary | ICD-10-CM | POA: Diagnosis present

## 2018-04-18 DIAGNOSIS — F202 Catatonic schizophrenia: Secondary | ICD-10-CM | POA: Diagnosis not present

## 2018-04-18 DIAGNOSIS — G9340 Encephalopathy, unspecified: Secondary | ICD-10-CM | POA: Diagnosis not present

## 2018-04-18 DIAGNOSIS — E1122 Type 2 diabetes mellitus with diabetic chronic kidney disease: Secondary | ICD-10-CM | POA: Diagnosis not present

## 2018-04-18 DIAGNOSIS — Z7951 Long term (current) use of inhaled steroids: Secondary | ICD-10-CM

## 2018-04-18 DIAGNOSIS — F061 Catatonic disorder due to known physiological condition: Secondary | ICD-10-CM

## 2018-04-18 DIAGNOSIS — M797 Fibromyalgia: Secondary | ICD-10-CM | POA: Diagnosis present

## 2018-04-18 DIAGNOSIS — I5033 Acute on chronic diastolic (congestive) heart failure: Secondary | ICD-10-CM | POA: Diagnosis present

## 2018-04-18 DIAGNOSIS — G4733 Obstructive sleep apnea (adult) (pediatric): Secondary | ICD-10-CM | POA: Diagnosis not present

## 2018-04-18 DIAGNOSIS — Z7982 Long term (current) use of aspirin: Secondary | ICD-10-CM

## 2018-04-18 DIAGNOSIS — F329 Major depressive disorder, single episode, unspecified: Secondary | ICD-10-CM | POA: Diagnosis present

## 2018-04-18 DIAGNOSIS — R0902 Hypoxemia: Secondary | ICD-10-CM | POA: Diagnosis not present

## 2018-04-18 DIAGNOSIS — N184 Chronic kidney disease, stage 4 (severe): Secondary | ICD-10-CM | POA: Diagnosis present

## 2018-04-18 DIAGNOSIS — F32A Depression, unspecified: Secondary | ICD-10-CM | POA: Diagnosis present

## 2018-04-18 DIAGNOSIS — Z87891 Personal history of nicotine dependence: Secondary | ICD-10-CM | POA: Diagnosis not present

## 2018-04-18 DIAGNOSIS — J45901 Unspecified asthma with (acute) exacerbation: Secondary | ICD-10-CM | POA: Diagnosis not present

## 2018-04-18 DIAGNOSIS — I10 Essential (primary) hypertension: Secondary | ICD-10-CM | POA: Diagnosis not present

## 2018-04-18 DIAGNOSIS — K219 Gastro-esophageal reflux disease without esophagitis: Secondary | ICD-10-CM | POA: Diagnosis not present

## 2018-04-18 MED ORDER — AMMONIA AROMATIC IN INHA
0.3000 mL | Freq: Once | RESPIRATORY_TRACT | Status: AC
  Administered 2018-04-18: 0.3 mL via RESPIRATORY_TRACT
  Filled 2018-04-18: qty 10

## 2018-04-18 NOTE — ED Triage Notes (Signed)
Patient from [facility]. Patient brought in by EMS. Patient last seen normal 9 P.M. Staff heard patient yell at while she was sleeping on the C-pap. Patient has been unresponsive since but will occasional blink her eyes. Blood sugar 249.

## 2018-04-18 NOTE — ED Provider Notes (Signed)
[facility] EMERGENCY DEPARTMENT Provider Note   CSN: [id number] Arrival date & time: 04/18/18  2256     History   Chief Complaint Chief Complaint  Patient presents with  . Altered Mental Status    unresponsive    HPI [name] is a 67 y.o. female.  Level 5 caveat for altered mental status.  Patient brought in by EMS unresponsive but breathing.  She was apparently last seen normal at 9 PM.  She was on her CPAP sleeping when she suddenly yelled and was found to be unresponsive by nursing home staff.  She will blink her eyes but cannot move her extremities or follow commands.  Blood sugar was 249.  Patient was not answering any questions and will not move her tremors or follow commands.  She has been treated for cellulitis of her lower extremities.  Feels warm on arrival but rectal temperature is normal.  The history is provided by the EMS personnel. The history is limited by the condition of the patient.  Altered Mental Status      Past Medical History:  Diagnosis Date  . Anxiety   . Asthma   . Back pain   . Barrett esophagus   . CHF (congestive heart failure) ([place])   . CKD (chronic kidney disease) stage 3, GFR 30-59 ml/min (HCC)   . COPD (chronic obstructive pulmonary disease) ([place])   . Depression   . Diastolic heart failure ([place])   . Essential hypertension   . Fibromyalgia   . GERD (gastroesophageal reflux disease)   . Gout   . History of pericarditis   . Hyperlipidemia   . Hypothyroid   . Major depressive disorder   . Memory loss   . Nephrolithiasis    2008  . Obesity hypoventilation syndrome (HCC)    CPAP  . Obstructive sleep apnea   . PE (pulmonary embolism)    2010  . Peripheral neuropathy   . PTSD (post-traumatic stress disorder)   . Rheumatoid arthritis ([place])   . Schizophrenia ([place])   . Steroid dependence   . Type 2 diabetes mellitus ([place])   . Vitamin D deficiency     Patient Active Problem List   Diagnosis Date Noted  . Acute kidney injury  superimposed on chronic kidney disease ([place]) 01/13/2018  . Altered mental status   . Encephalopathy 10/11/2016  . Chest pain 08/12/2016  . CKD (chronic kidney disease) stage 3, GFR 30-59 ml/min (HCC) 08/12/2016  . History of pulmonary embolus (PE) 08/12/2016  . Obesity hypoventilation syndrome ([place]) 08/12/2016  . RBBB 08/12/2016  . Positive D dimer 08/12/2016  . CVA (cerebral infarction) 11/10/2015  . Physical deconditioning 07/01/2011  . Weakness 07/01/2011  . Diabetic foot ulcer ([place]) 05/19/2011  . Polypharmacy 02/09/2011  . BACK PAIN WITH RADICULOPATHY 05/25/2010  . Type 2 diabetes mellitus with diabetic polyneuropathy, without long-term current use of insulin ([place]) 08/18/2009  . Chronic diastolic heart failure ([place]) 04/25/2009  . Anemia 04/14/2009  . Sleep apnea 12/11/2008  . Diabetic peripheral neuropathy ([place]) 05/04/2007  . HYPERCHOLESTEROLEMIA 10/13/2006  . Gout, unspecified 10/13/2006  . HYPOKALEMIA 10/13/2006  . Schizophrenia ([place]) 10/13/2006  . Depression 10/13/2006  . Essential hypertension 10/13/2006  . Venous (peripheral) insufficiency 10/13/2006  . RHINITIS, ALLERGIC 10/13/2006  . Asthma 10/13/2006  . Reflux esophagitis 10/13/2006  . OSTEOARTHRITIS, MULTI SITES 10/13/2006  . INCONTINENCE, URGE 10/13/2006    Past Surgical History:  Procedure Laterality Date  . APPENDECTOMY    . CHOLECYSTECTOMY    .  COLONOSCOPY     benign polyps (12/2000), repeat colonoscopy performed in 2007  . ENDOMETRIAL BIOPSY     10/03 benign columnar mucosa (limited material)  . KIDNEY SURGERY       OB History   None      Home Medications    Prior to Admission medications   Medication Sig Start Date End Date Taking? Authorizing Provider  acetaminophen (TYLENOL) 325 MG tablet Take 325 mg by mouth 2 (two) times daily.    Yes [provider]  albuterol (PROVENTIL HFA;VENTOLIN HFA) 108 (90 Base) MCG/ACT inhaler Inhale 2 puffs into the lungs every 6 (six) hours as needed  for wheezing or shortness of breath.   Yes [provider]  albuterol (PROVENTIL) (2.5 MG/3ML) 0.083% nebulizer solution Take 2.5 mg by nebulization every 6 (six) hours as needed for wheezing or shortness of breath.   Yes [provider]  aspirin 81 MG tablet Take 81 mg by mouth daily.   Yes [provider]  atorvastatin (LIPITOR) 40 MG tablet Take 40 mg by mouth daily.    Yes [provider]  budesonide-formoterol (SYMBICORT) 160-4.5 MCG/ACT inhaler Inhale 2 puffs into the lungs 2 (two) times daily.   Yes [provider]  carvedilol (COREG) 25 MG tablet Take 25 mg by mouth 2 (two) times daily with a meal.   Yes [provider]  Cholecalciferol (VITAMIN D) 2000 units CAPS Take 4,000 Units by mouth daily.    Yes [provider]  docusate sodium (COLACE) 100 MG capsule Take 100 mg by mouth daily.    Yes [provider]  DULoxetine (CYMBALTA) 60 MG capsule Take 60 mg by mouth daily.   Yes [provider]  gabapentin (NEURONTIN) 300 MG capsule Take 300-600 mg by mouth See admin instructions. 300mg  twice daily and 600mg  at bedtime 04/29/17  Yes [provider]  glipiZIDE (GLUCOTROL XL) 5 MG 24 hr tablet Take 5 mg by mouth daily with breakfast.    Yes [provider]  hydrocortisone 2.5 % ointment Apply 1 application topically daily as needed (itchy skin).   Yes [provider]  hydrOXYzine (VISTARIL) 25 MG capsule Take 25 mg by mouth 2 (two) times daily as needed for anxiety.    Yes [provider]  ipratropium (ATROVENT) 0.02 % nebulizer solution Take 0.5 mg by nebulization every 6 (six) hours as needed for wheezing or shortness of breath.    Yes [provider]  levothyroxine (SYNTHROID, LEVOTHROID) 25 MCG tablet Take 25 mcg by mouth daily before breakfast.   Yes [provider]  linagliptin (TRADJENTA) 5 MG TABS tablet Take 5 mg by mouth daily.   Yes [provider]  loperamide (IMODIUM) 2 MG capsule Take 2 mg by mouth every 8 (eight) hours as needed for diarrhea or loose stools.    Yes [provider]  LORazepam (ATIVAN) 1 MG tablet Take 1 mg by mouth daily as needed for anxiety.    Yes [provider]  losartan (COZAAR) 100 MG tablet Take 100 mg by mouth daily.   Yes [provider]  montelukast (SINGULAIR) 10 MG tablet Take 10 mg by mouth at bedtime.   Yes [provider]  Multiple Vitamin (DAILY VITE PO) Take 1 tablet by mouth daily.   Yes [provider]  NON FORMULARY 1 each by Other route See admin instructions. CPAP at bedtime. CPAP setting of 15CM of water   Yes [provider]  omeprazole ([place])  20 MG capsule Take 20 mg by mouth daily.    Yes [provider]  prazosin (MINIPRESS) 2 MG capsule Take 2 mg by mouth at bedtime. For PTSD associated nightmares   Yes [provider]  pyridOXINE (VITAMIN B-6) 100 MG tablet Take 200 mg by mouth 2 (two) times daily.   Yes [provider]  risperiDONE (RISPERDAL) 0.5 MG tablet Take 0.5 mg by mouth 2 (two) times daily.    Yes [provider]  spironolactone (ALDACTONE) 25 MG tablet Take 1 tablet (25 mg total) by mouth daily. 04/12/18  Yes Mesner, Corene Cornea, MD  torsemide (DEMADEX) 10 MG tablet Take 40 mg by mouth daily.  04/29/17  Yes [provider]  traZODone (DESYREL) 100 MG tablet Take 100 mg by mouth at bedtime as needed for sleep.    Yes [provider]  doxycycline (VIBRAMYCIN) 100 MG capsule Take 1 capsule (100 mg total) by mouth 2 (two) times daily. Patient not taking: Reported on 04/18/2018 04/04/18   Recardo Evangelist, PA-C  EPINEPHrine (EPI-PEN) 0.3 mg/0.3 mL DEVI Inject 0.3 mg into the muscle once.    [provider]    Family History Family History  Problem Relation Age of Onset  . Bone cancer Mother   . Hypertension Mother   . Heart disease Mother   . COPD Father   . Heart  disease Father   . Stroke Father     Social History Social History   Tobacco Use  . Smoking status: Former Smoker    Types: Cigarettes    Last attempt to quit: 06/23/1972    Years since quitting: 45.8  . Smokeless tobacco: Never Used  Substance Use Topics  . Alcohol use: No  . Drug use: No     Allergies   Benztropine mesylate; Codeine; Diazepam; Diphenhydramine hcl; Penicillins; Sulfonamide derivatives; Thioridazine hcl; and Lisinopril   Review of Systems Review of Systems  Unable to perform ROS: Patient unresponsive     Physical Exam Updated Vital Signs BP (!) 170/75 (BP Location: Right Arm)   Pulse 73   Temp 98 F (36.7 C) (Oral)   Resp 20   Ht 5\' 1"  (1.549 m)   Wt 100.7 kg   SpO2 100%   BMI 41.95 kg/m   Physical Exam  Constitutional: She appears well-developed and well-nourished. No distress.  Patient blinks her eyes but does not speak and does not follow commands.  HENT:  Head: Normocephalic and atraumatic.  Mouth/Throat: Oropharynx is clear and moist. No oropharyngeal exudate.  Dry mucous membranes Gag reflex present  Eyes: Pupils are equal, round, and reactive to light. Conjunctivae and EOM are normal.  Neck: Normal range of motion. Neck supple.  No meningismus.  Cardiovascular: Normal rate, regular rhythm, normal heart sounds and intact distal pulses.  No murmur heard. Pulmonary/Chest: Effort normal. No respiratory distress. She has no wheezes. She exhibits no tenderness.  Abdominal: Soft. There is no tenderness. There is no rebound and no guarding.  Musculoskeletal: Normal range of motion. She exhibits no edema or tenderness.  Neurological: She is alert.  Patient opens her eyes and blinks.  She does not speak.  She does not follow commands. She will protect her face from falling arm. Blinks to threat  Skin: Skin is warm.  Nursing note and vitals reviewed.    ED Treatments / Results  Labs (all labs ordered are listed, but only abnormal  results are displayed) Labs Reviewed  CBC WITH DIFFERENTIAL/PLATELET - Abnormal; Notable for  the following components:      Result Value   RBC 2.97 (*)    Hemoglobin 9.8 (*)    HCT 28.5 (*)    All other components within normal limits  COMPREHENSIVE METABOLIC PANEL - Abnormal; Notable for the following components:   Glucose, Bld 240 (*)    BUN 42 (*)    Creatinine, Ser 2.34 (*)    Calcium 8.4 (*)    Total Protein 6.2 (*)    Albumin 2.9 (*)    GFR calc non Af Amer 21 (*)    GFR calc Af Amer 24 (*)    All other components within normal limits  ACETAMINOPHEN LEVEL - Abnormal; Notable for the following components:   Acetaminophen (Tylenol), Serum <10 (*)    All other components within normal limits  URINALYSIS, ROUTINE W REFLEX MICROSCOPIC - Abnormal; Notable for the following components:   Color, Urine STRAW (*)    Glucose, UA 150 (*)    Protein, ur 100 (*)    All other components within normal limits  BLOOD GAS, ARTERIAL - Abnormal; Notable for the following components:   pO2, Arterial 69.8 (*)    All other components within normal limits  CBG MONITORING, ED - Abnormal; Notable for the following components:   Glucose-Capillary 222 (*)    All other components within normal limits  CULTURE, BLOOD (ROUTINE X 2)  CULTURE, BLOOD (ROUTINE X 2)  ETHANOL  SALICYLATE LEVEL  RAPID URINE DRUG SCREEN, HOSP PERFORMED  AMMONIA  TSH  CK  I-STAT CG4 LACTIC ACID, ED  I-STAT CG4 LACTIC ACID, ED    EKG EKG Interpretation  Date/Time:  Tuesday April 18 2018 23:15:20 EDT Ventricular Rate:  73 PR Interval:    QRS Duration: 173 QT Interval:  460 QTC Calculation: 507 R Axis:   -48 Text Interpretation:  Ectopic atrial rhythm Borderline short PR interval RBBB and LAFB Left ventricular hypertrophy No significant change was found Confirmed by Ezequiel Essex [phone]) on 04/19/2018 1:36:24 AM   Radiology Ct Head Wo Contrast  Result Date: 04/19/2018 CLINICAL DATA:  Unresponsive this  evening. EXAM: CT HEAD WITHOUT CONTRAST TECHNIQUE: Contiguous axial images were obtained from the base of the skull through the vertex without intravenous contrast. COMPARISON:  04/12/2018 FINDINGS: Brain: No evidence of acute infarction, hemorrhage, hydrocephalus, extra-axial collection or mass lesion/mass effect. Vascular: No hyperdense vessel or unexpected calcification. Skull: Normal. Negative for fracture or focal lesion. Sinuses/Orbits: No acute finding. Other: None. IMPRESSION: Negative head CT. Electronically Signed   By: Lucienne Capers M.D.   On: 04/19/2018 00:02   Dg Chest Portable 1 View  Result Date: 04/18/2018 CLINICAL DATA:  Altered mental status EXAM: PORTABLE CHEST 1 VIEW COMPARISON:  03/24/2018, 01/12/2018 FINDINGS: Cardiomegaly with mild central congestion. Low lung volumes. No focal consolidation or pleural effusion. No pneumothorax. IMPRESSION: Cardiomegaly with mild central vascular congestion Electronically Signed   By: Donavan Foil M.D.   On: 04/18/2018 23:38    Procedures Procedures (including critical care time)  Medications Ordered in ED Medications  ammonia inhalant 0.3 mL (has no administration in time range)     Initial Impression / Assessment and Plan / ED Course  I have reviewed the triage vital signs and the nursing notes.  Pertinent labs & imaging results that were available during my care of the patient were reviewed by me and considered in my medical decision making (see chart for details).    Altered mental status, unresponsive and does not follow commands.  Last  seen normal at 9 PM.  Patient blinks to threats, is nonverbal, does not follow commands, projects arm from falling face. She seems to be more catatonic rather than hemiparetic.  Discussed with tele-neurology who agrees that this is not consistent with stroke.  CT head is negative. ABG does not show significant CO2 retention.  Seen by neurology Dr. Roanna Raider who thinks this is more of a toxic  metabolic encephalopathy rather than stroke.  Labs are reassuring.  Creatinine is at baseline.  Ammonia is normal.  UA is negative and chest x-ray is negative. Tox labs are normal.  Patient remains unresponsive and catatonic but is protecting airway.  Suspect this is likely psychiatric in origin.  There is no evidence of infectious or metabolic derangement on labs.  Admission discussed with Dr. Olevia Bowens.  CRITICAL CARE Performed by: Ezequiel Essex Total critical care time: 35 minutes Critical care time was exclusive of separately billable procedures and treating other patients. Critical care was necessary to treat or prevent imminent or life-threatening deterioration. Critical care was time spent personally by me on the following activities: development of treatment plan with patient and/or surrogate as well as nursing, discussions with consultants, evaluation of patient's response to treatment, examination of patient, obtaining history from patient or surrogate, ordering and performing treatments and interventions, ordering and review of laboratory studies, ordering and review of radiographic studies, pulse oximetry and re-evaluation of patient's condition.  Final Clinical Impressions(s) / ED Diagnoses   Final diagnoses:  Altered mental status, unspecified altered mental status type  Catatonia    ED Discharge Orders    None       Emma Birchler, Annie Main, MD 04/19/18 [phone]

## 2018-04-19 ENCOUNTER — Inpatient Hospital Stay (HOSPITAL_COMMUNITY): Payer: Medicare Other

## 2018-04-19 ENCOUNTER — Encounter (HOSPITAL_COMMUNITY): Payer: Self-pay | Admitting: Registered Nurse

## 2018-04-19 DIAGNOSIS — E039 Hypothyroidism, unspecified: Secondary | ICD-10-CM | POA: Diagnosis not present

## 2018-04-19 DIAGNOSIS — I130 Hypertensive heart and chronic kidney disease with heart failure and stage 1 through stage 4 chronic kidney disease, or unspecified chronic kidney disease: Secondary | ICD-10-CM | POA: Diagnosis not present

## 2018-04-19 DIAGNOSIS — Z86711 Personal history of pulmonary embolism: Secondary | ICD-10-CM | POA: Diagnosis not present

## 2018-04-19 DIAGNOSIS — E119 Type 2 diabetes mellitus without complications: Secondary | ICD-10-CM

## 2018-04-19 DIAGNOSIS — K219 Gastro-esophageal reflux disease without esophagitis: Secondary | ICD-10-CM | POA: Diagnosis not present

## 2018-04-19 DIAGNOSIS — J449 Chronic obstructive pulmonary disease, unspecified: Secondary | ICD-10-CM | POA: Diagnosis present

## 2018-04-19 DIAGNOSIS — G9340 Encephalopathy, unspecified: Secondary | ICD-10-CM | POA: Diagnosis not present

## 2018-04-19 DIAGNOSIS — G9349 Other encephalopathy: Secondary | ICD-10-CM | POA: Diagnosis not present

## 2018-04-19 DIAGNOSIS — R4182 Altered mental status, unspecified: Secondary | ICD-10-CM

## 2018-04-19 DIAGNOSIS — D649 Anemia, unspecified: Secondary | ICD-10-CM | POA: Diagnosis present

## 2018-04-19 DIAGNOSIS — Z7952 Long term (current) use of systemic steroids: Secondary | ICD-10-CM | POA: Diagnosis not present

## 2018-04-19 DIAGNOSIS — E1142 Type 2 diabetes mellitus with diabetic polyneuropathy: Secondary | ICD-10-CM | POA: Diagnosis not present

## 2018-04-19 DIAGNOSIS — E785 Hyperlipidemia, unspecified: Secondary | ICD-10-CM | POA: Diagnosis not present

## 2018-04-19 DIAGNOSIS — R2689 Other abnormalities of gait and mobility: Secondary | ICD-10-CM | POA: Diagnosis not present

## 2018-04-19 DIAGNOSIS — Z79890 Hormone replacement therapy (postmenopausal): Secondary | ICD-10-CM | POA: Diagnosis not present

## 2018-04-19 DIAGNOSIS — F4310 Post-traumatic stress disorder, unspecified: Secondary | ICD-10-CM | POA: Diagnosis not present

## 2018-04-19 DIAGNOSIS — G8250 Quadriplegia, unspecified: Secondary | ICD-10-CM | POA: Diagnosis not present

## 2018-04-19 DIAGNOSIS — M069 Rheumatoid arthritis, unspecified: Secondary | ICD-10-CM | POA: Diagnosis not present

## 2018-04-19 DIAGNOSIS — F061 Catatonic disorder due to known physiological condition: Secondary | ICD-10-CM

## 2018-04-19 DIAGNOSIS — Z7984 Long term (current) use of oral hypoglycemic drugs: Secondary | ICD-10-CM | POA: Diagnosis not present

## 2018-04-19 DIAGNOSIS — G4733 Obstructive sleep apnea (adult) (pediatric): Secondary | ICD-10-CM | POA: Diagnosis not present

## 2018-04-19 DIAGNOSIS — R569 Unspecified convulsions: Secondary | ICD-10-CM | POA: Diagnosis not present

## 2018-04-19 DIAGNOSIS — I5032 Chronic diastolic (congestive) heart failure: Secondary | ICD-10-CM | POA: Diagnosis not present

## 2018-04-19 DIAGNOSIS — Z79899 Other long term (current) drug therapy: Secondary | ICD-10-CM | POA: Diagnosis not present

## 2018-04-19 DIAGNOSIS — Z87891 Personal history of nicotine dependence: Secondary | ICD-10-CM | POA: Diagnosis not present

## 2018-04-19 DIAGNOSIS — F202 Catatonic schizophrenia: Secondary | ICD-10-CM | POA: Diagnosis not present

## 2018-04-19 DIAGNOSIS — N183 Chronic kidney disease, stage 3 (moderate): Secondary | ICD-10-CM | POA: Diagnosis present

## 2018-04-19 DIAGNOSIS — M797 Fibromyalgia: Secondary | ICD-10-CM | POA: Diagnosis not present

## 2018-04-19 DIAGNOSIS — F329 Major depressive disorder, single episode, unspecified: Secondary | ICD-10-CM | POA: Diagnosis not present

## 2018-04-19 DIAGNOSIS — Z7982 Long term (current) use of aspirin: Secondary | ICD-10-CM | POA: Diagnosis not present

## 2018-04-19 DIAGNOSIS — Z6841 Body Mass Index (BMI) 40.0 and over, adult: Secondary | ICD-10-CM | POA: Diagnosis not present

## 2018-04-19 LAB — BLOOD GAS, ARTERIAL
Acid-base deficit: 1.1 mmol/L (ref 0.0–2.0)
Bicarbonate: 23.6 mmol/L (ref 20.0–28.0)
Drawn by: 28459
O2 Saturation: 93.9 %
Patient temperature: 37
pCO2 arterial: 36.8 mmHg (ref 32.0–48.0)
pH, Arterial: 7.41 (ref 7.350–7.450)
pO2, Arterial: 69.8 mmHg — ABNORMAL LOW (ref 83.0–108.0)

## 2018-04-19 LAB — URINALYSIS, ROUTINE W REFLEX MICROSCOPIC
Bacteria, UA: NONE SEEN
Bilirubin Urine: NEGATIVE
Glucose, UA: 150 mg/dL — AB
Hgb urine dipstick: NEGATIVE
Ketones, ur: NEGATIVE mg/dL
Leukocytes, UA: NEGATIVE
Nitrite: NEGATIVE
Protein, ur: 100 mg/dL — AB
Specific Gravity, Urine: 1.008 (ref 1.005–1.030)
pH: 6 (ref 5.0–8.0)

## 2018-04-19 LAB — CBC WITH DIFFERENTIAL/PLATELET
Basophils Absolute: 0.1 10*3/uL (ref 0.0–0.1)
Basophils Relative: 1 %
Eosinophils Absolute: 0.3 10*3/uL (ref 0.0–0.7)
Eosinophils Relative: 3 %
HCT: 28.5 % — ABNORMAL LOW (ref 36.0–46.0)
Hemoglobin: 9.8 g/dL — ABNORMAL LOW (ref 12.0–15.0)
Lymphocytes Relative: 19 %
Lymphs Abs: 1.7 10*3/uL (ref 0.7–4.0)
MCH: 33 pg (ref 26.0–34.0)
MCHC: 34.4 g/dL (ref 30.0–36.0)
MCV: 96 fL (ref 78.0–100.0)
Monocytes Absolute: 0.5 10*3/uL (ref 0.1–1.0)
Monocytes Relative: 6 %
Neutro Abs: 6.3 10*3/uL (ref 1.7–7.7)
Neutrophils Relative %: 71 %
Platelets: 173 10*3/uL (ref 150–400)
RBC: 2.97 MIL/uL — ABNORMAL LOW (ref 3.87–5.11)
RDW: 12.9 % (ref 11.5–15.5)
WBC: 8.8 10*3/uL (ref 4.0–10.5)

## 2018-04-19 LAB — TSH: TSH: 3.747 u[IU]/mL (ref 0.350–4.500)

## 2018-04-19 LAB — CK: Total CK: 105 U/L (ref 38–234)

## 2018-04-19 LAB — COMPREHENSIVE METABOLIC PANEL
ALT: 23 U/L (ref 0–44)
AST: 17 U/L (ref 15–41)
Albumin: 2.9 g/dL — ABNORMAL LOW (ref 3.5–5.0)
Alkaline Phosphatase: 82 U/L (ref 38–126)
Anion gap: 9 (ref 5–15)
BUN: 42 mg/dL — ABNORMAL HIGH (ref 8–23)
CO2: 23 mmol/L (ref 22–32)
Calcium: 8.4 mg/dL — ABNORMAL LOW (ref 8.9–10.3)
Chloride: 105 mmol/L (ref 98–111)
Creatinine, Ser: 2.34 mg/dL — ABNORMAL HIGH (ref 0.44–1.00)
GFR calc Af Amer: 24 mL/min — ABNORMAL LOW (ref >60)
GFR calc non Af Amer: 21 mL/min — ABNORMAL LOW (ref >60)
Glucose, Bld: 240 mg/dL — ABNORMAL HIGH (ref 70–99)
Potassium: 3.8 mmol/L (ref 3.5–5.1)
Sodium: 137 mmol/L (ref 135–145)
Total Bilirubin: 0.6 mg/dL (ref 0.3–1.2)
Total Protein: 6.2 g/dL — ABNORMAL LOW (ref 6.5–8.1)

## 2018-04-19 LAB — SALICYLATE LEVEL: Salicylate Lvl: <7 mg/dL (ref 2.8–30.0)

## 2018-04-19 LAB — MRSA PCR SCREENING: MRSA by PCR: NEGATIVE

## 2018-04-19 LAB — GLUCOSE, CAPILLARY
Glucose-Capillary: 212 mg/dL — ABNORMAL HIGH (ref 70–99)
Glucose-Capillary: 152 mg/dL — ABNORMAL HIGH (ref 70–99)

## 2018-04-19 LAB — CBG MONITORING, ED: Glucose-Capillary: 222 mg/dL — ABNORMAL HIGH (ref 70–99)

## 2018-04-19 LAB — RAPID URINE DRUG SCREEN, HOSP PERFORMED
Amphetamines: NOT DETECTED
Barbiturates: NOT DETECTED
Benzodiazepines: NOT DETECTED
Cocaine: NOT DETECTED
Opiates: NOT DETECTED
Tetrahydrocannabinol: NOT DETECTED

## 2018-04-19 LAB — ETHANOL: Alcohol, Ethyl (B): <10 mg/dL (ref <10)

## 2018-04-19 LAB — AMMONIA: Ammonia: 16 umol/L (ref 9–35)

## 2018-04-19 LAB — I-STAT CG4 LACTIC ACID, ED: Lactic Acid, Venous: 1.8 mmol/L (ref 0.5–1.9)

## 2018-04-19 LAB — ACETAMINOPHEN LEVEL: Acetaminophen (Tylenol), Serum: <10 ug/mL — ABNORMAL LOW (ref 10–30)

## 2018-04-19 MED ORDER — HYDROCORTISONE 2.5 % EX OINT
1.0000 "application " | TOPICAL_OINTMENT | Freq: Every day | CUTANEOUS | Status: DC | PRN
  Filled 2018-04-19: qty 30

## 2018-04-19 MED ORDER — PRAZOSIN HCL 1 MG PO CAPS
2.0000 mg | ORAL_CAPSULE | Freq: Every day | ORAL | Status: DC
  Administered 2018-04-19 – 2018-04-20 (×2): 2 mg via ORAL
  Filled 2018-04-19 (×5): qty 1

## 2018-04-19 MED ORDER — ASPIRIN EC 81 MG PO TBEC
81.0000 mg | DELAYED_RELEASE_TABLET | Freq: Every day | ORAL | Status: DC
  Administered 2018-04-19 – 2018-04-21 (×3): 81 mg via ORAL
  Filled 2018-04-19 (×3): qty 1

## 2018-04-19 MED ORDER — MONTELUKAST SODIUM 10 MG PO TABS
10.0000 mg | ORAL_TABLET | Freq: Every day | ORAL | Status: DC
  Administered 2018-04-19 – 2018-04-20 (×2): 10 mg via ORAL
  Filled 2018-04-19 (×2): qty 1

## 2018-04-19 MED ORDER — LOSARTAN POTASSIUM 50 MG PO TABS
100.0000 mg | ORAL_TABLET | Freq: Every day | ORAL | Status: DC
  Administered 2018-04-19 – 2018-04-21 (×3): 100 mg via ORAL
  Filled 2018-04-19 (×3): qty 2

## 2018-04-19 MED ORDER — HEPARIN SODIUM (PORCINE) 5000 UNIT/ML IJ SOLN
5000.0000 [IU] | Freq: Three times a day (TID) | INTRAMUSCULAR | Status: DC
  Administered 2018-04-19 – 2018-04-21 (×6): 5000 [IU] via SUBCUTANEOUS
  Filled 2018-04-19 (×6): qty 1

## 2018-04-19 MED ORDER — ALBUTEROL SULFATE (2.5 MG/3ML) 0.083% IN NEBU: 2.5000 mg | INHALATION_SOLUTION | Freq: Four times a day (QID) | RESPIRATORY_TRACT | Status: DC | PRN

## 2018-04-19 MED ORDER — LINAGLIPTIN 5 MG PO TABS
5.0000 mg | ORAL_TABLET | Freq: Every day | ORAL | Status: DC
  Administered 2018-04-19 – 2018-04-21 (×3): 5 mg via ORAL
  Filled 2018-04-19 (×3): qty 1

## 2018-04-19 MED ORDER — LEVOTHYROXINE SODIUM 25 MCG PO TABS
25.0000 ug | ORAL_TABLET | Freq: Every day | ORAL | Status: DC
  Administered 2018-04-19 – 2018-04-21 (×3): 25 ug via ORAL
  Filled 2018-04-19 (×3): qty 1

## 2018-04-19 MED ORDER — TORSEMIDE 20 MG PO TABS
40.0000 mg | ORAL_TABLET | Freq: Every day | ORAL | Status: DC
  Administered 2018-04-19 – 2018-04-21 (×3): 40 mg via ORAL
  Filled 2018-04-19 (×3): qty 2

## 2018-04-19 MED ORDER — PANTOPRAZOLE SODIUM 40 MG PO TBEC
40.0000 mg | DELAYED_RELEASE_TABLET | Freq: Every day | ORAL | Status: DC
  Administered 2018-04-19 – 2018-04-21 (×3): 40 mg via ORAL
  Filled 2018-04-19 (×3): qty 1

## 2018-04-19 MED ORDER — ACETAMINOPHEN 325 MG PO TABS
325.0000 mg | ORAL_TABLET | Freq: Two times a day (BID) | ORAL | Status: DC
  Administered 2018-04-19 – 2018-04-21 (×4): 325 mg via ORAL
  Filled 2018-04-19 (×4): qty 1

## 2018-04-19 MED ORDER — DOCUSATE SODIUM 100 MG PO CAPS
100.0000 mg | ORAL_CAPSULE | Freq: Every day | ORAL | Status: DC
  Administered 2018-04-19 – 2018-04-21 (×3): 100 mg via ORAL
  Filled 2018-04-19 (×3): qty 1

## 2018-04-19 MED ORDER — TRAZODONE HCL 50 MG PO TABS: 100.0000 mg | ORAL_TABLET | Freq: Every evening | ORAL | Status: DC | PRN

## 2018-04-19 MED ORDER — MOMETASONE FURO-FORMOTEROL FUM 200-5 MCG/ACT IN AERO
2.0000 | INHALATION_SPRAY | Freq: Two times a day (BID) | RESPIRATORY_TRACT | Status: DC
  Administered 2018-04-19 – 2018-04-21 (×5): 2 via RESPIRATORY_TRACT
  Filled 2018-04-19: qty 8.8

## 2018-04-19 MED ORDER — ONDANSETRON HCL 4 MG PO TABS: 4.0000 mg | ORAL_TABLET | Freq: Four times a day (QID) | ORAL | Status: DC | PRN

## 2018-04-19 MED ORDER — RISPERIDONE 0.5 MG PO TABS
0.5000 mg | ORAL_TABLET | Freq: Two times a day (BID) | ORAL | Status: DC
  Administered 2018-04-19 – 2018-04-21 (×4): 0.5 mg via ORAL
  Filled 2018-04-19 (×4): qty 1

## 2018-04-19 MED ORDER — LORAZEPAM 1 MG PO TABS: 1.0000 mg | ORAL_TABLET | Freq: Every day | ORAL | Status: DC | PRN

## 2018-04-19 MED ORDER — VITAMIN B-6 50 MG PO TABS
200.0000 mg | ORAL_TABLET | Freq: Two times a day (BID) | ORAL | Status: DC
  Administered 2018-04-19 – 2018-04-20 (×3): 200 mg via ORAL
  Filled 2018-04-19 (×3): qty 4

## 2018-04-19 MED ORDER — NYSTATIN 100000 UNIT/GM EX POWD
Freq: Two times a day (BID) | CUTANEOUS | Status: DC
  Administered 2018-04-19 – 2018-04-21 (×4): via TOPICAL
  Filled 2018-04-19: qty 15

## 2018-04-19 MED ORDER — INSULIN ASPART 100 UNIT/ML ~~LOC~~ SOLN
0.0000 [IU] | Freq: Three times a day (TID) | SUBCUTANEOUS | Status: DC
  Administered 2018-04-20: 2 [IU] via SUBCUTANEOUS
  Administered 2018-04-20 (×2): 3 [IU] via SUBCUTANEOUS
  Administered 2018-04-21: 5 [IU] via SUBCUTANEOUS
  Administered 2018-04-21: 2 [IU] via SUBCUTANEOUS

## 2018-04-19 MED ORDER — ACETAMINOPHEN 325 MG PO TABS: 650.0000 mg | ORAL_TABLET | Freq: Four times a day (QID) | ORAL | Status: DC | PRN

## 2018-04-19 MED ORDER — LOPERAMIDE HCL 2 MG PO CAPS: 2.0000 mg | ORAL_CAPSULE | Freq: Three times a day (TID) | ORAL | Status: DC | PRN

## 2018-04-19 MED ORDER — EPINEPHRINE (ANAPHYLAXIS) 1 MG/ML IJ SOLN
0.3000 mg | INTRAMUSCULAR | Status: DC | PRN
  Filled 2018-04-19: qty 0.3

## 2018-04-19 MED ORDER — CARVEDILOL 12.5 MG PO TABS
25.0000 mg | ORAL_TABLET | Freq: Two times a day (BID) | ORAL | Status: DC
  Administered 2018-04-19 – 2018-04-21 (×4): 25 mg via ORAL
  Filled 2018-04-19 (×4): qty 2

## 2018-04-19 MED ORDER — SPIRONOLACTONE 25 MG PO TABS
25.0000 mg | ORAL_TABLET | Freq: Every day | ORAL | Status: DC
  Administered 2018-04-19 – 2018-04-21 (×3): 25 mg via ORAL
  Filled 2018-04-19 (×3): qty 1

## 2018-04-19 MED ORDER — HYDROCORTISONE 1 % EX OINT
TOPICAL_OINTMENT | Freq: Every day | CUTANEOUS | Status: DC | PRN
  Filled 2018-04-19: qty 28.35

## 2018-04-19 MED ORDER — GABAPENTIN 300 MG PO CAPS
300.0000 mg | ORAL_CAPSULE | Freq: Two times a day (BID) | ORAL | Status: DC
  Administered 2018-04-19 – 2018-04-21 (×4): 300 mg via ORAL
  Filled 2018-04-19 (×4): qty 1

## 2018-04-19 MED ORDER — POTASSIUM CHLORIDE IN NACL 20-0.45 MEQ/L-% IV SOLN
INTRAVENOUS | Status: AC
  Administered 2018-04-19: 03:00:00 via INTRAVENOUS
  Filled 2018-04-19 (×2): qty 1000

## 2018-04-19 MED ORDER — GLIPIZIDE ER 5 MG PO TB24
5.0000 mg | ORAL_TABLET | Freq: Every day | ORAL | Status: DC
  Administered 2018-04-19 – 2018-04-21 (×3): 5 mg via ORAL
  Filled 2018-04-19 (×3): qty 1

## 2018-04-19 MED ORDER — HYDROXYZINE HCL 25 MG PO TABS: 25.0000 mg | ORAL_TABLET | Freq: Two times a day (BID) | ORAL | Status: DC | PRN

## 2018-04-19 MED ORDER — HYDROXYZINE PAMOATE 25 MG PO CAPS
25.0000 mg | ORAL_CAPSULE | Freq: Two times a day (BID) | ORAL | Status: DC | PRN
  Filled 2018-04-19: qty 1

## 2018-04-19 MED ORDER — ONDANSETRON HCL 4 MG/2ML IJ SOLN: 4.0000 mg | Freq: Four times a day (QID) | INTRAMUSCULAR | Status: DC | PRN

## 2018-04-19 MED ORDER — DULOXETINE HCL 60 MG PO CPEP
60.0000 mg | ORAL_CAPSULE | Freq: Every day | ORAL | Status: DC
  Administered 2018-04-19 – 2018-04-21 (×3): 60 mg via ORAL
  Filled 2018-04-19 (×3): qty 1

## 2018-04-19 MED ORDER — ATORVASTATIN CALCIUM 40 MG PO TABS
40.0000 mg | ORAL_TABLET | Freq: Every day | ORAL | Status: DC
  Administered 2018-04-19 – 2018-04-21 (×3): 40 mg via ORAL
  Filled 2018-04-19 (×3): qty 1

## 2018-04-19 MED ORDER — GABAPENTIN 300 MG PO CAPS
600.0000 mg | ORAL_CAPSULE | Freq: Every day | ORAL | Status: DC
  Administered 2018-04-19 – 2018-04-20 (×2): 600 mg via ORAL
  Filled 2018-04-19 (×2): qty 2

## 2018-04-19 MED ORDER — VITAMIN D 1000 UNITS PO TABS
4000.0000 [IU] | ORAL_TABLET | Freq: Every day | ORAL | Status: DC
  Administered 2018-04-19 – 2018-04-21 (×3): 4000 [IU] via ORAL
  Filled 2018-04-19 (×3): qty 4

## 2018-04-19 MED ORDER — ACETAMINOPHEN 650 MG RE SUPP: 650.0000 mg | Freq: Four times a day (QID) | RECTAL | Status: DC | PRN

## 2018-04-19 MED ORDER — IPRATROPIUM-ALBUTEROL 0.5-2.5 (3) MG/3ML IN SOLN: 0.5000 mg | Freq: Four times a day (QID) | RESPIRATORY_TRACT | Status: DC | PRN

## 2018-04-19 NOTE — ED Notes (Signed)
Patient returned from [place]. Patient stroke screen done and MD does not feel that TPA is required due to not stroke related.

## 2018-04-19 NOTE — ED Notes (Signed)
Unable to do a swallow screen on patient.

## 2018-04-19 NOTE — Progress Notes (Signed)
At 1800, patient had been cleared by TTS and neurology had seen her. I called Dr. Jerilee Hoh to inquire about medications. She told me to give her all of her daily meds that were due. I administered those meds.

## 2018-04-19 NOTE — Progress Notes (Signed)
Held morning medications. Patient is only able to open eyes when touched and will not respond in any other way.

## 2018-04-19 NOTE — Consult Note (Signed)
[facility]  A. , MD     [email or web address]          [name] is an 67 y.o. female.   ASSESSMENT/PLAN: 1. Unexplained encephalopathy:  Given the spontaneous improvement unwitnessed seizure is a possibility.  Medication effect may also be initially given the chronic renal impairment.  Psychogenic causes are also a possibility but a diagnosis of exclusion.  An EEG is recommended.  Minimize psychotropic medications.  Physical and occupational therapies are recommended.  2. Flaccid weakness of the upper and lower extremities like related to 1.  3. OSA:   May restart CPAP machine.      The patient is 67-year-old white female who apparently was using her CPAP machine and woke up confused and unresponsive.  She was noted to be flaccid throughout.  There is concern that the patient may have catatonia.  The patient eyes were open partially but limbs were flaccid.  It appears there were no verbal output.  She did not follow commands.  Workup was mostly unremarkable for neurological causes.  There are some metabolic issues although they mostly appear chronic with chronic renal impairment.  It appears that the patient has improved spontaneously and may be close to baseline.  No reports of tonic-clonic activity, oral trauma or other seizures.  No focal deficits are reported.  The patient reports that she has no recollection of how she came to the hospital or why she is here.   She reports that she has had some difficulties walking and fell about 2 weeks ago sustained some injuries to the left leg. The review of systems limited but otherwise unrevealing.    GENERAL: She is in no acute distress at this time.  HEENT:    No trauma appreciated neck is supple.  Reported to shin.  ABDOMEN: Soft  EXTREMITIES:   Mild erythema of the distal legs bilaterally about 1/3 up.  There is mild pitting edema bilaterally more on the left side.  There is a small healing superficial 2 in also  on the left shin.    BACK: Normal alignment.  SKIN: Normal by inspection.    MENTAL STATUS: Alert and oriented -   Hospital year and month. Speech, language and cognition are generally  Fine although there appears to be low academic achievement.   CRANIAL NERVES: Pupils are equal, round and reactive to light and accommodation; extraocular movements are full, there is no significant nystagmus; upper and lower facial muscles are normal in strength and symmetric, there is no flattening of the nasolabial folds; tongue is midline; uvula is midline; shoulder elevation is normal.  MOTOR: Normal tone, bulk and strength -  Except the left lower extremity worse 4/5; no pronator drift.  COORDINATION: Left finger to nose is normal, right finger to nose is normal, No rest tremor; no intention tremor; no postural tremor; no bradykinesia.  REFLEXES: Deep tendon reflexes are symmetrical and normal -  Except at the left knee where it is reduced. Plantar responses are equivocal flexor bilaterally.   SENSATION: Normal to light touch and temperature.    brain MRI is reviewed and shows no acute findings on DWI.  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  There is also mild global atrophy.   Blood pressure (!) 172/78, pulse 70, temperature 97.7 F (36.5 C), temperature source Oral, resp. rate (!) 22, height 5' 1" (1.549 m), weight 100.7 kg, SpO2 99 %.  Past Medical History:  Diagnosis Date  . Anxiety   .   Asthma   . Back pain   . Barrett esophagus   . CHF (congestive heart failure) (HCC)   . CKD (chronic kidney disease) stage 3, GFR 30-59 ml/min (HCC)   . COPD (chronic obstructive pulmonary disease) (HCC)   . Depression   . Diastolic heart failure (HCC)   . Essential hypertension   . Fibromyalgia   . GERD (gastroesophageal reflux disease)   . Gout   . History of pericarditis   . Hyperlipidemia   . Hypothyroid   . Major depressive disorder   . Memory loss   .  Nephrolithiasis    2008  . Obesity hypoventilation syndrome (HCC)    CPAP  . Obstructive sleep apnea   . PE (pulmonary embolism)    2010  . Peripheral neuropathy   . PTSD (post-traumatic stress disorder)   . Rheumatoid arthritis (HCC)   . Schizophrenia (HCC)   . Steroid dependence   . Type 2 diabetes mellitus (HCC)   . Vitamin D deficiency     Past Surgical History:  Procedure Laterality Date  . APPENDECTOMY    . CHOLECYSTECTOMY    . COLONOSCOPY     benign polyps (12/2000), repeat colonoscopy performed in 2007  . ENDOMETRIAL BIOPSY     10/03 benign columnar mucosa (limited material)  . KIDNEY SURGERY      Family History  Problem Relation Age of Onset  . Bone cancer Mother   . Hypertension Mother   . Heart disease Mother   . COPD Father   . Heart disease Father   . Stroke Father     Social History:  reports that she quit smoking about 45 years ago. Her smoking use included cigarettes. She has never used smokeless tobacco. She reports that she does not drink alcohol or use drugs.  Allergies:  Allergies  Allergen Reactions  . Benztropine Mesylate Other (See Comments)    REACTION: Alopecia and white spots on eyes  . Codeine Other (See Comments)    REACTION: Mouth Swelling - Tachycardia  . Diazepam Other (See Comments)    REACTION: COMA  . Diphenhydramine Hcl Other (See Comments)    REACTION: Tachycardia , anxiousness  . Penicillins Other (See Comments)    REACTION: ulcers in mouth, swelling, throat swelling  Has patient had a PCN reaction causing immediate rash, facial/tongue/throat swelling, SOB or lightheadedness with hypotension: YES Has patient had a PCN reaction causing severe rash involving Mucus membranes or skin necrosis: Unknown Has patient had a PCN reaction that required hospitalization Unknown Has patient had a PCN reaction occurring within the last 10 years: unknown If all of the above answers are "NO", then may proceed with Cephalospori  . Sulfonamide  Derivatives Other (See Comments)    REACTION: rash - blisters  . Thioridazine Hcl Other (See Comments)    REACTION: swelling  . Lisinopril Other (See Comments)    REACTION: cough    Medications: Prior to Admission medications   Medication Sig Start Date End Date Taking? Authorizing Provider  acetaminophen (TYLENOL) 325 MG tablet Take 325 mg by mouth 2 (two) times daily.    Yes [provider]  albuterol (PROVENTIL HFA;VENTOLIN HFA) 108 (90 Base) MCG/ACT inhaler Inhale 2 puffs into the lungs every 6 (six) hours as needed for wheezing or shortness of breath.   Yes [provider]  albuterol (PROVENTIL) (2.5 MG/3ML) 0.083% nebulizer solution Take 2.5 mg by nebulization every 6 (six) hours as needed for wheezing or shortness of breath.   Yes [provider]  aspirin 81 MG tablet Take 81 mg by mouth daily.   Yes [provider]  atorvastatin (LIPITOR) 40 MG tablet Take 40 mg by mouth daily.    Yes [provider]  budesonide-formoterol (SYMBICORT) 160-4.5 MCG/ACT inhaler Inhale 2 puffs into the lungs 2 (two) times daily.   Yes [provider]  carvedilol (COREG) 25 MG tablet Take 25 mg by mouth 2 (two) times daily with a meal.   Yes [provider]  Cholecalciferol (VITAMIN D) 2000 units CAPS Take 4,000 Units by mouth daily.    Yes [provider]  docusate sodium (COLACE) 100 MG capsule Take 100 mg by mouth daily.    Yes [provider]  DULoxetine (CYMBALTA) 60 MG capsule Take 60 mg by mouth daily.   Yes [provider]  gabapentin (NEURONTIN) 300 MG capsule Take 300-600 mg by mouth See admin instructions. 300mg twice daily and 600mg at bedtime 04/29/17  Yes [provider]  glipiZIDE (GLUCOTROL XL) 5 MG 24 hr tablet Take 5 mg by mouth daily with breakfast.    Yes [provider]  hydrocortisone 2.5 % ointment Apply 1 application topically daily as needed (itchy skin).   Yes [provider]  hydrOXYzine (VISTARIL) 25 MG capsule Take 25 mg by mouth 2 (two) times daily as needed for anxiety.    Yes [provider]  ipratropium (ATROVENT) 0.02 % nebulizer solution Take 0.5 mg by nebulization every 6 (six) hours as needed for wheezing or shortness of breath.    Yes [provider]  levothyroxine (SYNTHROID, LEVOTHROID) 25 MCG tablet Take 25 mcg by mouth daily before breakfast.   Yes [provider]  linagliptin (TRADJENTA) 5 MG TABS tablet Take 5 mg by mouth daily.   Yes [provider]  loperamide (IMODIUM) 2 MG capsule Take 2 mg by mouth every 8 (eight) hours as needed for diarrhea or loose stools.    Yes [provider]  LORazepam (ATIVAN) 1 MG tablet Take 1 mg by mouth daily as needed for anxiety.    Yes [provider]  losartan (COZAAR) 100 MG tablet Take 100 mg by mouth daily.   Yes [provider]  montelukast (SINGULAIR) 10 MG tablet Take 10 mg by mouth at bedtime.   Yes [provider]  Multiple Vitamin (DAILY VITE PO) Take 1 tablet by mouth daily.   Yes [provider]  NON FORMULARY 1 each by Other route See admin instructions. CPAP at bedtime. CPAP setting of 15CM of water   Yes [provider]  omeprazole (PRILOSEC) 20 MG capsule Take 20 mg by mouth daily.    Yes [provider]  prazosin (MINIPRESS) 2 MG capsule Take 2 mg by mouth at bedtime. For PTSD associated nightmares   Yes [provider]  pyridOXINE (VITAMIN B-6) 100 MG tablet Take 200 mg by mouth 2 (two) times daily.   Yes [provider]  risperiDONE (RISPERDAL) 0.5 MG tablet Take 0.5 mg by mouth 2 (two) times daily.    Yes [provider]  spironolactone (ALDACTONE) 25 MG tablet Take 1 tablet (25 mg total) by mouth daily. 04/12/18  Yes Mesner, Jason, MD  torsemide (DEMADEX) 10 MG tablet Take 40 mg by mouth daily.  04/29/17  Yes [provider]  traZODone (DESYREL) 100  MG tablet Take 100 mg by mouth at bedtime as needed for sleep.    Yes [provider]  doxycycline (VIBRAMYCIN) 100 MG   capsule Take 1 capsule (100 mg total) by mouth 2 (two) times daily. Patient not taking: Reported on 04/18/2018 04/04/18   Gekas, Kelly Marie, PA-C  EPINEPHrine (EPI-PEN) 0.3 mg/0.3 mL DEVI Inject 0.3 mg into the muscle once.    [provider]    Scheduled Meds: . acetaminophen  325 mg Oral BID  . aspirin EC  81 mg Oral Daily  . atorvastatin  40 mg Oral Daily  . carvedilol  25 mg Oral BID WC  . cholecalciferol  4,000 Units Oral Daily  . docusate sodium  100 mg Oral Daily  . DULoxetine  60 mg Oral Daily  . gabapentin  300 mg Oral BID  . gabapentin  600 mg Oral QHS  . glipiZIDE  5 mg Oral Q breakfast  . heparin  5,000 Units Subcutaneous Q8H  . insulin aspart  0-9 Units Subcutaneous TID WC  . levothyroxine  25 mcg Oral QAC breakfast  . linagliptin  5 mg Oral Daily  . losartan  100 mg Oral Daily  . mometasone-formoterol  2 puff Inhalation BID  . montelukast  10 mg Oral QHS  . pantoprazole  40 mg Oral Daily  . prazosin  2 mg Oral QHS  . pyridOXINE  200 mg Oral BID  . risperiDONE  0.5 mg Oral BID  . spironolactone  25 mg Oral Daily  . torsemide  40 mg Oral Daily   Continuous Infusions: PRN Meds:.acetaminophen **OR** acetaminophen, hydrocortisone, hydrOXYzine, ipratropium-albuterol, loperamide, LORazepam, ondansetron **OR** ondansetron (ZOFRAN) IV, traZODone     Results for orders placed or performed during the hospital encounter of 04/18/18 (from the past 48 hour(s))  Blood gas, arterial ([facility] & [facility] ONLY)     Status: Abnormal   Collection Time: 04/18/18 11:15 PM  Result Value Ref Range   Delivery systems ROOM AIR    pH, Arterial 7.410 7.350 - 7.450   pCO2 arterial 36.8 32.0 - 48.0 mmHg   pO2, Arterial 69.8 (L) 83.0 - 108.0 mmHg   Bicarbonate 23.6 20.0 - 28.0 mmol/L   Acid-base deficit 1.1 0.0 - 2.0 mmol/L   O2 Saturation 93.9 %   Patient  temperature 37.0    Collection site RIGHT RADIAL    Drawn by [id number]    Sample type ARTERIAL    Allens test (pass/fail) PASS PASS    Comment: Performed at [facility], [address] [place], [place] [address]  CBC with Differential/Platelet     Status: Abnormal   Collection Time: 04/18/18 11:30 PM  Result Value Ref Range   WBC 8.8 4.0 - 10.5 K/uL   RBC 2.97 (L) 3.87 - 5.11 MIL/uL   Hemoglobin 9.8 (L) 12.0 - 15.0 g/dL   HCT 28.5 (L) 36.0 - 46.0 %   MCV 96.0 78.0 - 100.0 fL   MCH 33.0 26.0 - 34.0 pg   MCHC 34.4 30.0 - 36.0 g/dL   RDW 12.9 11.5 - 15.5 %   Platelets 173 150 - 400 K/uL   Neutrophils Relative % 71 %   Neutro Abs 6.3 1.7 - 7.7 K/uL   Lymphocytes Relative 19 %   Lymphs Abs 1.7 0.7 - 4.0 K/uL   Monocytes Relative 6 %   Monocytes Absolute 0.5 0.1 - 1.0 K/uL   Eosinophils Relative 3 %   Eosinophils Absolute 0.3 0.0 - 0.7 K/uL   Basophils Relative 1 %   Basophils Absolute 0.1 0.0 - 0.1 K/uL    Comment: Performed at [facility], [address] [place], [place] [address]    Comprehensive metabolic panel     Status: Abnormal   Collection Time: 04/18/18 11:30 PM  Result Value Ref Range   Sodium 137 135 - 145 mmol/L   Potassium 3.8 3.5 - 5.1 mmol/L   Chloride 105 98 - 111 mmol/L   CO2 23 22 - 32 mmol/L   Glucose, Bld 240 (H) 70 - 99 mg/dL   BUN 42 (H) 8 - 23 mg/dL   Creatinine, Ser 2.34 (H) 0.44 - 1.00 mg/dL   Calcium 8.4 (L) 8.9 - 10.3 mg/dL   Total Protein 6.2 (L) 6.5 - 8.1 g/dL   Albumin 2.9 (L) 3.5 - 5.0 g/dL   AST 17 15 - 41 U/L   ALT 23 0 - 44 U/L   Alkaline Phosphatase 82 38 - 126 U/L   Total Bilirubin 0.6 0.3 - 1.2 mg/dL   GFR calc non Af Amer 21 (L) >60 mL/min   GFR calc Af Amer 24 (L) >60 mL/min    Comment: (NOTE) The eGFR has been calculated using the CKD EPI equation. This calculation has not been validated in all clinical situations. eGFR's persistently <60 mL/min signify possible Chronic Kidney Disease.    Anion gap 9 5 - 15    Comment: Performed at  [facility], [address] [place], [place] [address]  Ethanol     Status: None   Collection Time: 04/18/18 11:30 PM  Result Value Ref Range   Alcohol, Ethyl (B) <10 <10 mg/dL    Comment: Performed at [facility], [address] [place], [place] [address]  Acetaminophen level     Status: Abnormal   Collection Time: 04/18/18 11:30 PM  Result Value Ref Range   Acetaminophen (Tylenol), Serum <10 (L) 10 - 30 ug/mL    Comment: (NOTE) Therapeutic concentrations vary significantly. A range of 10-30 ug/mL  may be an effective concentration for many patients. However, some  are best treated at concentrations outside of this range. Acetaminophen concentrations >150 ug/mL at 4 hours after ingestion  and >50 ug/mL at 12 hours after ingestion are often associated with  toxic reactions. Performed at [facility], [address] [place], [place] [address]   Salicylate level     Status: None   Collection Time: 04/18/18 11:30 PM  Result Value Ref Range   Salicylate Lvl <7.0 2.8 - 30.0 mg/dL    Comment: Performed at [facility], [address] [place], [place] [address]  Ammonia     Status: None   Collection Time: 04/18/18 11:30 PM  Result Value Ref Range   Ammonia 16 9 - 35 umol/L    Comment: Performed at [facility], [address] [place], [place] [address]  Blood culture (routine x 2)     Status: None (Preliminary result)   Collection Time: 04/18/18 11:30 PM  Result Value Ref Range   Specimen Description BLOOD LEFT ARM    Special Requests      BOTTLES DRAWN AEROBIC AND ANAEROBIC Blood Culture adequate volume   Culture      NO GROWTH < 12 HOURS Performed at [facility], [address] [place], [place] [address]    Report Status PENDING   TSH     Status: None   Collection Time: 04/18/18 11:30 PM  Result Value Ref Range   TSH 3.747 0.350 - 4.500 uIU/mL    Comment: Performed by a 3rd Generation assay with a functional sensitivity of <=0.01 uIU/mL. Performed at [facility],  [address] , [place] [address]     CK     Status: None   Collection Time: 04/18/18 11:30 PM  Result Value Ref Range   Total CK 105 38 - 234 U/L    Comment: Performed at [facility], [address] [place], [place] [address]  Blood culture (routine x 2)     Status: None (Preliminary result)   Collection Time: 04/18/18 11:34 PM  Result Value Ref Range   Specimen Description BLOOD RIGHT HAND    Special Requests      BOTTLES DRAWN AEROBIC AND ANAEROBIC Blood Culture adequate volume   Culture      NO GROWTH < 12 HOURS Performed at [facility], [address] [place], [place] [address]    Report Status PENDING   CBG monitoring, ED     Status: Abnormal   Collection Time: 04/19/18 12:35 AM  Result Value Ref Range   Glucose-Capillary 222 (H) 70 - 99 mg/dL  I-Stat CG4 Lactic Acid, ED     Status: None   Collection Time: 04/19/18 12:36 AM  Result Value Ref Range   Lactic Acid, Venous 1.80 0.5 - 1.9 mmol/L  Rapid urine drug screen (hospital performed)     Status: None   Collection Time: 04/19/18  1:32 AM  Result Value Ref Range   Opiates NONE DETECTED NONE DETECTED   Cocaine NONE DETECTED NONE DETECTED   Benzodiazepines NONE DETECTED NONE DETECTED   Amphetamines NONE DETECTED NONE DETECTED   Tetrahydrocannabinol NONE DETECTED NONE DETECTED   Barbiturates NONE DETECTED NONE DETECTED    Comment: (NOTE) DRUG SCREEN FOR MEDICAL PURPOSES ONLY.  IF CONFIRMATION IS NEEDED FOR ANY PURPOSE, NOTIFY LAB WITHIN 5 DAYS. LOWEST DETECTABLE LIMITS FOR URINE DRUG SCREEN Drug Class                     Cutoff (ng/mL) Amphetamine and metabolites    1000 Barbiturate and metabolites    200 Benzodiazepine                 200 Tricyclics and metabolites     300 Opiates and metabolites        300 Cocaine and metabolites        300 THC                            50 Performed at [facility], [address] [place], [place] [address]   Urinalysis, Routine w reflex microscopic     Status: Abnormal    Collection Time: 04/19/18  1:32 AM  Result Value Ref Range   Color, Urine STRAW (A) YELLOW   APPearance CLEAR CLEAR   Specific Gravity, Urine 1.008 1.005 - 1.030   pH 6.0 5.0 - 8.0   Glucose, UA 150 (A) NEGATIVE mg/dL   Hgb urine dipstick NEGATIVE NEGATIVE   Bilirubin Urine NEGATIVE NEGATIVE   Ketones, ur NEGATIVE NEGATIVE mg/dL   Protein, ur 100 (A) NEGATIVE mg/dL   Nitrite NEGATIVE NEGATIVE   Leukocytes, UA NEGATIVE NEGATIVE   RBC / HPF 0-5 0 - 5 RBC/hpf   WBC, UA 0-5 0 - 5 WBC/hpf   Bacteria, UA NONE SEEN NONE SEEN   Squamous Epithelial / LPF 0-5 0 - 5    Comment: Performed at [facility], [address] [place], [place] [address]  MRSA PCR Screening     Status: None   Collection Time: 04/19/18  4:23 AM  Result Value Ref Range   MRSA by PCR NEGATIVE NEGATIVE      Comment:        The GeneXpert MRSA Assay (FDA approved for NASAL specimens only), is one component of a comprehensive MRSA colonization surveillance program. It is not intended to diagnose MRSA infection nor to guide or monitor treatment for MRSA infections. Performed at [facility], [address] [place], [place] [address]   Glucose, capillary     Status: Abnormal   Collection Time: 04/19/18  8:31 AM  Result Value Ref Range   Glucose-Capillary 212 (H) 70 - 99 mg/dL   Comment 1 Notify RN    Comment 2 Document in Chart   Glucose, capillary     Status: Abnormal   Collection Time: 04/19/18  4:01 PM  Result Value Ref Range   Glucose-Capillary 152 (H) 70 - 99 mg/dL    Studies/Results:  BRAIN MRI FINDINGS: Brain: No acute infarction, hemorrhage, hydrocephalus, extra-axial collection or mass lesion. Several stable nonspecific T2 FLAIR hyperintensities in subcortical and periventricular white matter are compatible with mild chronic microvascular ischemic changes for age. Mild volume loss of the brain. 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  Vascular: Normal flow voids.  Skull  and upper cervical spine: Normal marrow signal.  Sinuses/Orbits: Mild paranasal sinus disease with maxillary sinus mucous retention cyst. Bilateral intra-ocular lens replacement. Partial opacification of the mastoid air cells.  Other: None.  IMPRESSION: 1. No acute intracranial abnormality. 2. Stable mild chronic microvascular ischemic changes and volume loss of the brain. 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 3. Mild paranasal sinus disease.          Kanon Novosel A. Merlene Laughter, M.D.  Diplomate, Tax adviser of Psychiatry and Neurology ( Neurology). 04/19/2018, 5:46 PM

## 2018-04-19 NOTE — Progress Notes (Signed)
Patient seen and examined, database reviewed, no family members at bedside.  Patient was admitted early this a.m. from [facility] assisted living facility due to acute encephalopathy/catatonia.  She has a history significant for schizophrenia, anxiety, depression, PTSD and other multiple medical comorbidities including diastolic heart failure, hypertension, stage III chronic kidney disease, hyperlipidemia and hypothyroidism.  On exam today her eyes are open she blinks but has no other meaningful interaction.  When I asked her to wiggle her toes she wiggles her right toes but not her left, when I asked her to lift her hands she is able to do it on both sides but unable to squeeze.  She has no verbal responses to my questions.  I do suspect that this is likely related to her schizophrenia, question catatonia.  However will order an MRI of the brain to fully rule out an organic pathology.  We will also request neurology for assistance.  Psychiatric consult has also been requested.  We will continue to follow.  Domingo Mend, MD [facility] Pager: [phone]

## 2018-04-19 NOTE — Consult Note (Signed)
  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 - presentation not c/w stroke; r/o infectious/metabolic/toxic etiologies as possible component of encephalopathy  Recommendations:  infx,metabolic and toxic evaluation as per primary team neurology inpt consult as per IM as needed consider psychiatry evaluation if above unrevealing   --------------------------------------------------------------------- CC: AMS  HPI:  67yo W w pmh of hypothyroidism, copd, osa, ptsd, major depression, DM and DPN, htn and ckd who was last at her baseline earlier tonight - unknown exact time and woke up from nap at 9pm screaming and then did not speak anymore while staying awake.   Diagnostic results: hct NAF Vital Signs:    Exam:  Mental Status:  awake, inattentive, not following requests mute  Cranial Nerves:  no dysarthria Visual fields:  intact by confrontation Extraocular movements: intact by oculocephalic Ptosis: Absent Facial sensation: unable to test Facial movements: symmetrical  Motor Exam:  no effort; avoids touching face; all 4 limbs touch bed; no effort on request   Tremor/Abnormal Movements:  intention tremor:absent postural tremor:absent resting tremor:absent  Sensory Exam:   Light touch: no w/al to noxious stimuli but slight discomfort appears to occur in chest region  Coordination:   Finger nose finger: unable to participate  nihss 28  Medical Decision Making:  - Extensive number of diagnosis or management options are considered above.   - Extensive amount of complex data reviewed.   - High risk of complication and/or morbidity or mortality are associated with differential diagnostic considerations above.  - There may be uncertain outcome and increased probability of prolonged functional impairment or high probability of severe prolonged functional impairment associated with some of these differential diagnosis.    Medical Data Reviewed:  1.Data reviewed include clinical labs, radiology,  Medical Tests;   2.Tests results discussed w/performing or interpreting physician;   3.Obtaining/reviewing old medical records;  4.Obtaining case history from another source;  5.Independent review of image, tracing or specimen.    Patient was informed the Neurology Consult would happen via TeleHealth consult by way of interactive audio and video telecommunications and consented to receiving care in this manner.

## 2018-04-19 NOTE — Progress Notes (Signed)
Patient is now talking and eating. Still some confusion but showing improvement.

## 2018-04-19 NOTE — H&P (Signed)
History and Physical    [name] EGB:[id number] DOB: Apr 21, 1951 DOA: 04/18/2018  PCP: Madelyn Brunner, MD   Patient coming from: [facility].  I have personally briefly reviewed patient's old medical records in [place]  Chief Complaint: AMS.  HPI: [name] is a 67 y.o. female with medical history significant of anxiety, depression, PTSD, schizophrenia, chronic back pain, varus esophagus, diastolic CHF, stage III CKD, essential hypertension, history of pericarditis, fibromyalgia, GERD, gout, hyperlipidemia, hypothyroidism, memory loss, nephrolithiasis, obesity hypoventilation syndrome CPAP, history of PE, peripheral neuropathy, rheumatoid arthritis, steroid dependent, type 2 diabetes, vitamin D deficiency who was sent from the nursing home due to altered mental status.  Apparently the patient started yelling while she was sleeping with the CPAP mask on.  Since then, the patient has been totally silent, does move and does not respond to questions or speak.  She reflexively blinks and protects her face from falling arm.  ED Course: Initial vital signs temperature 98 F, pulse 73, respirations 20, blood pressure 170/75 mmHg and O2 sat 100%.  Ammonia inhalant was ordered.  Tele-neurology evaluated the patient and deemed the case as a metabolic versus psych etiology.  They do not think that this is a CVA.  Urine toxicology was negative.  Urinalysis shows straw-colored with glucosuria 150 and proteinuria 100 mg/dL, but otherwise unremarkable.  ABG showed mildly decreased PO2 of 69.8 mmHg, but was otherwise within normal limits.  White count was 8.8, hemoglobin 9.8 g/dL and platelets 173.  Sodium 137, potassium 3.8, chloride 105, CO2 23 and lactic acid 1.8 mmol/L.  Ammonia, alcohol, acetaminophen, salicylate, TSH and total CK are normal.  EKG is not significantly changed when compared to previous tracing.  Her CT head was negative for acute intracranial pathology.  Review of Systems:  Unable to obtain.   Past Medical History:  Diagnosis Date  . Anxiety   . Asthma   . Back pain   . Barrett esophagus   . CHF (congestive heart failure) ([place])   . CKD (chronic kidney disease) stage 3, GFR 30-59 ml/min (HCC)   . COPD (chronic obstructive pulmonary disease) ([place])   . Depression   . Diastolic heart failure ([place])   . Essential hypertension   . Fibromyalgia   . GERD (gastroesophageal reflux disease)   . Gout   . History of pericarditis   . Hyperlipidemia   . Hypothyroid   . Major depressive disorder   . Memory loss   . Nephrolithiasis    2008  . Obesity hypoventilation syndrome (HCC)    CPAP  . Obstructive sleep apnea   . PE (pulmonary embolism)    2010  . Peripheral neuropathy   . PTSD (post-traumatic stress disorder)   . Rheumatoid arthritis ([place])   . Schizophrenia ([place])   . Steroid dependence   . Type 2 diabetes mellitus ([place])   . Vitamin D deficiency     Past Surgical History:  Procedure Laterality Date  . APPENDECTOMY    . CHOLECYSTECTOMY    . COLONOSCOPY     benign polyps (12/2000), repeat colonoscopy performed in 2007  . ENDOMETRIAL BIOPSY     10/03 benign columnar mucosa (limited material)  . KIDNEY SURGERY       reports that she quit smoking about 45 years ago. Her smoking use included cigarettes. She has never used smokeless tobacco. She reports that she does not drink alcohol or use drugs.  Allergies  Allergen Reactions  . Benztropine Mesylate Other (See Comments)  REACTION: Alopecia and white spots on eyes  . Codeine Other (See Comments)    REACTION: Mouth Swelling - Tachycardia  . Diazepam Other (See Comments)    REACTION: COMA  . Diphenhydramine Hcl Other (See Comments)    REACTION: Tachycardia , anxiousness  . Penicillins Other (See Comments)    REACTION: ulcers in mouth, swelling, throat swelling  Has patient had a PCN reaction causing immediate rash, facial/tongue/throat swelling, SOB or lightheadedness with hypotension:  YES Has patient had a PCN reaction causing severe rash involving Mucus membranes or skin necrosis: Unknown Has patient had a PCN reaction that required hospitalization Unknown Has patient had a PCN reaction occurring within the last 10 years: unknown If all of the above answers are "NO", then may proceed with Cephalospori  . Sulfonamide Derivatives Other (See Comments)    REACTION: rash - blisters  . Thioridazine Hcl Other (See Comments)    REACTION: swelling  . Lisinopril Other (See Comments)    REACTION: cough    Family History  Problem Relation Age of Onset  . Bone cancer Mother   . Hypertension Mother   . Heart disease Mother   . COPD Father   . Heart disease Father   . Stroke Father    Prior to Admission medications   Medication Sig Start Date End Date Taking? Authorizing Provider  acetaminophen (TYLENOL) 325 MG tablet Take 325 mg by mouth 2 (two) times daily.    Yes [provider]  albuterol (PROVENTIL HFA;VENTOLIN HFA) 108 (90 Base) MCG/ACT inhaler Inhale 2 puffs into the lungs every 6 (six) hours as needed for wheezing or shortness of breath.   Yes [provider]  albuterol (PROVENTIL) (2.5 MG/3ML) 0.083% nebulizer solution Take 2.5 mg by nebulization every 6 (six) hours as needed for wheezing or shortness of breath.   Yes [provider]  aspirin 81 MG tablet Take 81 mg by mouth daily.   Yes [provider]  atorvastatin (LIPITOR) 40 MG tablet Take 40 mg by mouth daily.    Yes [provider]  budesonide-formoterol (SYMBICORT) 160-4.5 MCG/ACT inhaler Inhale 2 puffs into the lungs 2 (two) times daily.   Yes [provider]  carvedilol (COREG) 25 MG tablet Take 25 mg by mouth 2 (two) times daily with a meal.   Yes [provider]  Cholecalciferol (VITAMIN D) 2000 units CAPS Take 4,000 Units by mouth daily.    Yes [provider]  docusate sodium (COLACE) 100 MG capsule Take 100 mg by mouth daily.    Yes  [provider]  DULoxetine (CYMBALTA) 60 MG capsule Take 60 mg by mouth daily.   Yes [provider]  gabapentin (NEURONTIN) 300 MG capsule Take 300-600 mg by mouth See admin instructions. 300mg  twice daily and 600mg  at bedtime 04/29/17  Yes [provider]  glipiZIDE (GLUCOTROL XL) 5 MG 24 hr tablet Take 5 mg by mouth daily with breakfast.    Yes [provider]  hydrocortisone 2.5 % ointment Apply 1 application topically daily as needed (itchy skin).   Yes [provider]  hydrOXYzine (VISTARIL) 25 MG capsule Take 25 mg by mouth 2 (two) times daily as needed for anxiety.    Yes [provider]  ipratropium (ATROVENT) 0.02 % nebulizer solution Take 0.5 mg by nebulization every 6 (six) hours as needed for wheezing or shortness of breath.    Yes [provider]  levothyroxine (SYNTHROID, LEVOTHROID) 25 MCG tablet Take 25 mcg by mouth daily  before breakfast.   Yes [provider]  linagliptin (TRADJENTA) 5 MG TABS tablet Take 5 mg by mouth daily.   Yes [provider]  loperamide (IMODIUM) 2 MG capsule Take 2 mg by mouth every 8 (eight) hours as needed for diarrhea or loose stools.    Yes [provider]  LORazepam (ATIVAN) 1 MG tablet Take 1 mg by mouth daily as needed for anxiety.    Yes [provider]  losartan (COZAAR) 100 MG tablet Take 100 mg by mouth daily.   Yes [provider]  montelukast (SINGULAIR) 10 MG tablet Take 10 mg by mouth at bedtime.   Yes [provider]  Multiple Vitamin (DAILY VITE PO) Take 1 tablet by mouth daily.   Yes [provider]  NON FORMULARY 1 each by Other route See admin instructions. CPAP at bedtime. CPAP setting of 15CM of water   Yes [provider]  omeprazole (PRILOSEC) 20 MG capsule Take 20 mg by mouth daily.    Yes [provider]  prazosin (MINIPRESS) 2 MG capsule Take 2 mg by mouth at bedtime. For PTSD associated  nightmares   Yes [provider]  pyridOXINE (VITAMIN B-6) 100 MG tablet Take 200 mg by mouth 2 (two) times daily.   Yes [provider]  risperiDONE (RISPERDAL) 0.5 MG tablet Take 0.5 mg by mouth 2 (two) times daily.    Yes [provider]  spironolactone (ALDACTONE) 25 MG tablet Take 1 tablet (25 mg total) by mouth daily. 04/12/18  Yes Mesner, Corene Cornea, MD  torsemide (DEMADEX) 10 MG tablet Take 40 mg by mouth daily.  04/29/17  Yes [provider]  traZODone (DESYREL) 100 MG tablet Take 100 mg by mouth at bedtime as needed for sleep.    Yes [provider]  doxycycline (VIBRAMYCIN) 100 MG capsule Take 1 capsule (100 mg total) by mouth 2 (two) times daily. Patient not taking: Reported on 04/18/2018 04/04/18   Recardo Evangelist, PA-C  EPINEPHrine (EPI-PEN) 0.3 mg/0.3 mL DEVI Inject 0.3 mg into the muscle once.    [provider]    Physical Exam: Vitals:   04/18/18 2300 04/18/18 2302 04/18/18 2316 04/19/18 0000  BP: (!) 170/75  (!) 167/75 (!) 175/78  Pulse: 73  73   Resp: 20  20 (!) 22  Temp: 98 F (36.7 C)  98.7 F (37.1 C)   TempSrc: Oral  Rectal   SpO2: 100%  96% 96%  Weight:  100.7 kg    Height:  5\' 1"  (1.549 m)      Constitutional: NAD, calm, comfortable Eyes: PERRL, lids and conjunctivae normal ENMT: Mucous membranes are mildly dry. Posterior pharynx clear of any exudate or lesions.  (Gag Reflex was present one performed by Dr. Wyvonnia Dusky) Neck: Normal, supple, no masses, no thyromegaly Respiratory: clear to auscultation bilaterally, no wheezing, no crackles. Normal respiratory effort. No accessory muscle use.  Cardiovascular: Regular rate and rhythm, no murmurs / rubs / gallops. No extremity edema. 2+ pedal pulses. No carotid bruits.  Abdomen: Obese, soft, no tenderness, no masses palpated. No hepatosplenomegaly. Bowel sounds positive.  Musculoskeletal: no clubbing / cyanosis. Good ROM, no contractures. Normal muscle tone.  Skin: no  rashes, lesions, ulcers limited exam. Neurologic: Seems to be catatonic. Psychiatric: Does not respond to questions.  Labs on Admission: I have personally reviewed following labs and imaging studies  CBC: Recent Labs  Lab 04/12/18 1145 04/18/18 2330  WBC  --  8.8  NEUTROABS  --  6.3  HGB 8.8* 9.8*  HCT 26.0* 28.5*  MCV  --  96.0  PLT  --  836   Basic Metabolic Panel: Recent Labs  Lab 04/12/18 1145 04/18/18 2330  NA 138 137  K 4.2 3.8  CL 106 105  CO2  --  23  GLUCOSE 227* 240*  BUN 50* 42*  CREATININE 2.30* 2.34*  CALCIUM  --  8.4*   GFR: Estimated Creatinine Clearance: 25.8 mL/min (A) (by C-G formula based on SCr of 2.34 mg/dL (H)). Liver Function Tests: Recent Labs  Lab 04/18/18 2330  AST 17  ALT 23  ALKPHOS 82  BILITOT 0.6  PROT 6.2*  ALBUMIN 2.9*   No results for input(s): LIPASE, AMYLASE in the last 168 hours. Recent Labs  Lab 04/18/18 2330  AMMONIA 16   Coagulation Profile: No results for input(s): INR, PROTIME in the last 168 hours. Cardiac Enzymes: Recent Labs  Lab 04/18/18 2330  CKTOTAL 105   BNP (last 3 results) No results for input(s): PROBNP in the last 8760 hours. HbA1C: No results for input(s): HGBA1C in the last 72 hours. CBG: Recent Labs  Lab 04/19/18 0035  GLUCAP 222*   Lipid Profile: No results for input(s): CHOL, HDL, LDLCALC, TRIG, CHOLHDL, LDLDIRECT in the last 72 hours. Thyroid Function Tests: Recent Labs    04/18/18 2330  TSH 3.747   Anemia Panel: No results for input(s): VITAMINB12, FOLATE, FERRITIN, TIBC, IRON, RETICCTPCT in the last 72 hours. Urine analysis:    Component Value Date/Time   COLORURINE STRAW (A) 04/12/2018 1102   APPEARANCEUR CLEAR 04/12/2018 1102   LABSPEC 1.006 04/12/2018 1102   PHURINE 5.0 04/12/2018 1102   GLUCOSEU 50 (A) 04/12/2018 1102   HGBUR NEGATIVE 04/12/2018 1102   HGBUR negative 10/20/2009 1425   BILIRUBINUR NEGATIVE 04/12/2018 1102   KETONESUR NEGATIVE 04/12/2018 1102    PROTEINUR 100 (A) 04/12/2018 1102   UROBILINOGEN 0.2 10/05/2011 1002   NITRITE NEGATIVE 04/12/2018 1102   LEUKOCYTESUR NEGATIVE 04/12/2018 1102    Radiological Exams on Admission: Ct Head Wo Contrast  Result Date: 04/19/2018 CLINICAL DATA:  Unresponsive this evening. EXAM: CT HEAD WITHOUT CONTRAST TECHNIQUE: Contiguous axial images were obtained from the base of the skull through the vertex without intravenous contrast. COMPARISON:  04/12/2018 FINDINGS: Brain: No evidence of acute infarction, hemorrhage, hydrocephalus, extra-axial collection or mass lesion/mass effect. Vascular: No hyperdense vessel or unexpected calcification. Skull: Normal. Negative for fracture or focal lesion. Sinuses/Orbits: No acute finding. Other: None. IMPRESSION: Negative head CT. Electronically Signed   By: Lucienne Capers M.D.   On: 04/19/2018 00:02   Dg Chest Portable 1 View  Result Date: 04/18/2018 CLINICAL DATA:  Altered mental status EXAM: PORTABLE CHEST 1 VIEW COMPARISON:  03/24/2018, 01/12/2018 FINDINGS: Cardiomegaly with mild central congestion. Low lung volumes. No focal consolidation or pleural effusion. No pneumothorax. IMPRESSION: Cardiomegaly with mild central vascular congestion Electronically Signed   By: Donavan Foil M.D.   On: 04/18/2018 23:38    EKG: Independently reviewed.  Vent. rate 73 BPM PR interval * ms QRS duration 173 ms QT/QTc 460/507 ms P-R-T axes -85 -48 20 Ectopic atrial rhythm Borderline short PR interval RBBB and LAFB Left ventricular hypertrophy  Assessment/Plan Principal Problem:   Altered mental status The patient seems to be catatonic. Observation/telemetry. Etiology is most likely psych. Work-up has been benign so far. Psych consult in a.m.  Active Problems:   Diabetic peripheral neuropathy (HCC) Continue gabapentin.    Anemia Monitor hematocrit and hemoglobin.  Schizophrenia (HCC) Continue Risperdal 0.5 mg p.o. twice daily.    Depression Continue  duloxetine 60 mg p.o. Daily.    Essential hypertension Continue carvedilol, torsemide, spironolactone and losartan. Monitor blood pressure, heart rate, BUN, creatinine and lites.    Chronic diastolic heart failure (HCC) No signs of decompensation. Continue carvedilol 25 mg p.o. twice daily. Continue losartan 100 mg p.o. daily. Continue spironolactone 25 mg p.o. daily. Continue Demadex 40 mg p.o. daily.    CKD (chronic kidney disease) stage 3, GFR 30-59 ml/min (HCC) Monitor renal function electrolytes.    Type 2 diabetes mellitus (HCC) Carbohydrate modified diet. Continue glipizide 5 mg p.o. daily. Continue Tradjenta 5 mg p.o. daily.    DVT prophylaxis: Heparin SQ. Code Status: Full code. Family Communication: None at bedside. Disposition Plan: Observation for AMS. Consults called: Behavioral health consult. Admission status: Inpatient/telemetry.   Reubin Milan MD [facility] Pager [phone]  If 7PM-7AM, please contact night-coverage [email or web address] Password [email or web address]  04/19/2018, 1:46 AM

## 2018-04-19 NOTE — Consult Note (Signed)
Tele psych Assessment   [name], 67 y.o., female patient seen via telepsych by TTS and this provider; chart reviewed and consulted with Dr. Dwyane Dee on 04/19/18.  On evaluation [name] reports she was brought to the hospital because she was having some confusion. Patient states that she is feeling better and thinking clearly.  Patient denies suicidal/self-harm/homicidal ideation and paranoia.  Patient does endorse psychosis stating that she hears whispers and has been hearing them since she was 67 yr old.  Patient states that the whispers are always there and never go away and Dr. Hoyle Barr her psychiatrist at [facility] is aware.   During evaluation [name] laying in bed she is alert/oriented x 4.  Patient was able to correctly state her date of birth, age, current location, month, year, and name of president.  She is aware of what happened to get her hospitalized and knows that she lives in a assisted living facility.  Patient could not name the current assisted living facility that she is staying in now; stating she has only been there for 2 months; but, was able to give the name of her prior assisted living facility.  she's calm/cooperative; and mood is congruent with affect.  She does not appear to be responding to internal/external stimuli or delusional thoughts; but did endorse auditory hallucinations; hearing whispers; which is her baseline as stating that the whispers never go away and never give commands.    Patient denies suicidal/self-harm/homicidal ideation, and paranoia.  Patient answered question appropriately.     Recommendations:  Follow up with current outpatient psychiatric provider  Disposition:  Patient psychiatrically cleared No evidence of imminent risk to self or others at present.   Patient does not meet criteria for psychiatric inpatient admission.  Spoke with Kathleen Beach, RN who was at patient bedside; informed of above recommendation and disposition.  States she will  inform Dr. Mervin Hack B. Rankin, NP

## 2018-04-19 NOTE — BH Assessment (Signed)
[facility] Assessment Progress Note  Clinician spoke to pt's RN, Larene Beach, concerning request for psychiatry consult. After much discussion about pt's catatonic state and the various other consults pending, it was decided that a consult to TTS would not be requested until AFTER the neurology consult was completed to see if anything was revealed with that.   Kenna Gilbert. Lovena Le, [place], [place], [facility] Counselor
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
## 2018-04-19 NOTE — Care Management (Signed)
Received call from [facility]. Patient is from [facility] and active with [facility] home health.

## 2018-04-20 ENCOUNTER — Inpatient Hospital Stay (HOSPITAL_COMMUNITY)
Admit: 2018-04-20 | Discharge: 2018-04-20 | Disposition: A | Discharge status: Home / Self Care | Payer: Medicare Other | Attending: Neurology | Admitting: Neurology

## 2018-04-20 DIAGNOSIS — R569 Unspecified convulsions: Secondary | ICD-10-CM | POA: Diagnosis not present

## 2018-04-20 DIAGNOSIS — G9340 Encephalopathy, unspecified: Secondary | ICD-10-CM | POA: Diagnosis not present

## 2018-04-20 DIAGNOSIS — I5032 Chronic diastolic (congestive) heart failure: Secondary | ICD-10-CM

## 2018-04-20 DIAGNOSIS — F202 Catatonic schizophrenia: Secondary | ICD-10-CM | POA: Diagnosis not present

## 2018-04-20 DIAGNOSIS — G8250 Quadriplegia, unspecified: Secondary | ICD-10-CM | POA: Diagnosis not present

## 2018-04-20 DIAGNOSIS — E039 Hypothyroidism, unspecified: Secondary | ICD-10-CM | POA: Diagnosis not present

## 2018-04-20 DIAGNOSIS — F061 Catatonic disorder due to known physiological condition: Secondary | ICD-10-CM | POA: Diagnosis not present

## 2018-04-20 DIAGNOSIS — I130 Hypertensive heart and chronic kidney disease with heart failure and stage 1 through stage 4 chronic kidney disease, or unspecified chronic kidney disease: Secondary | ICD-10-CM | POA: Diagnosis not present

## 2018-04-20 DIAGNOSIS — R4182 Altered mental status, unspecified: Secondary | ICD-10-CM | POA: Diagnosis not present

## 2018-04-20 DIAGNOSIS — Z6841 Body Mass Index (BMI) 40.0 and over, adult: Secondary | ICD-10-CM | POA: Diagnosis not present

## 2018-04-20 LAB — GLUCOSE, CAPILLARY
Glucose-Capillary: 188 mg/dL — ABNORMAL HIGH (ref 70–99)
Glucose-Capillary: 139 mg/dL — ABNORMAL HIGH (ref 70–99)
Glucose-Capillary: 212 mg/dL — ABNORMAL HIGH (ref 70–99)
Glucose-Capillary: 222 mg/dL — ABNORMAL HIGH (ref 70–99)
Glucose-Capillary: 187 mg/dL — ABNORMAL HIGH (ref 70–99)
Glucose-Capillary: 201 mg/dL — ABNORMAL HIGH (ref 70–99)

## 2018-04-20 NOTE — Procedures (Signed)
[place] A. Merlene Laughter, MD     [email or web address]           HISTORY: This is a 67 year old female who presents with confusion and altered mental stat studies been done to evaluate for seizures as the etiology.  MEDICATIONS: Scheduled Meds: . acetaminophen  325 mg Oral BID  . aspirin EC  81 mg Oral Daily  . atorvastatin  40 mg Oral Daily  . carvedilol  25 mg Oral BID WC  . cholecalciferol  4,000 Units Oral Daily  . docusate sodium  100 mg Oral Daily  . DULoxetine  60 mg Oral Daily  . gabapentin  300 mg Oral BID  . gabapentin  600 mg Oral QHS  . glipiZIDE  5 mg Oral Q breakfast  . heparin  5,000 Units Subcutaneous Q8H  . insulin aspart  0-9 Units Subcutaneous TID WC  . levothyroxine  25 mcg Oral QAC breakfast  . linagliptin  5 mg Oral Daily  . losartan  100 mg Oral Daily  . mometasone-formoterol  2 puff Inhalation BID  . montelukast  10 mg Oral QHS  . nystatin   Topical BID  . pantoprazole  40 mg Oral Daily  . prazosin  2 mg Oral QHS  . pyridOXINE  200 mg Oral BID  . risperiDONE  0.5 mg Oral BID  . spironolactone  25 mg Oral Daily  . torsemide  40 mg Oral Daily   Continuous Infusions: PRN Meds:.acetaminophen **OR** acetaminophen, hydrocortisone, hydrOXYzine, ipratropium-albuterol, loperamide, LORazepam, ondansetron **OR** ondansetron (ZOFRAN) IV, traZODone  Prior to Admission medications   Medication Sig Start Date End Date Taking? Authorizing Provider  acetaminophen (TYLENOL) 325 MG tablet Take 325 mg by mouth 2 (two) times daily.    Yes [provider]  albuterol (PROVENTIL HFA;VENTOLIN HFA) 108 (90 Base) MCG/ACT inhaler Inhale 2 puffs into the lungs every 6 (six) hours as needed for wheezing or shortness of breath.   Yes [provider]  albuterol (PROVENTIL) (2.5 MG/3ML) 0.083% nebulizer solution Take 2.5 mg by nebulization every 6 (six) hours as needed for wheezing or shortness of breath.   Yes [provider]  aspirin 81  MG tablet Take 81 mg by mouth daily.   Yes [provider]  atorvastatin (LIPITOR) 40 MG tablet Take 40 mg by mouth daily.    Yes [provider]  budesonide-formoterol (SYMBICORT) 160-4.5 MCG/ACT inhaler Inhale 2 puffs into the lungs 2 (two) times daily.   Yes [provider]  carvedilol (COREG) 25 MG tablet Take 25 mg by mouth 2 (two) times daily with a meal.   Yes [provider]  Cholecalciferol (VITAMIN D) 2000 units CAPS Take 4,000 Units by mouth daily.    Yes [provider]  docusate sodium (COLACE) 100 MG capsule Take 100 mg by mouth daily.    Yes [provider]  DULoxetine (CYMBALTA) 60 MG capsule Take 60 mg by mouth daily.   Yes [provider]  gabapentin (NEURONTIN) 300 MG capsule Take 300-600 mg by mouth See admin instructions. 300mg  twice daily and 600mg  at bedtime 04/29/17  Yes [provider]  glipiZIDE (GLUCOTROL XL) 5 MG 24 hr tablet Take 5 mg by mouth daily with breakfast.    Yes [provider]  hydrocortisone 2.5 % ointment Apply 1 application topically daily as needed (itchy skin).   Yes [provider]  hydrOXYzine (VISTARIL) 25 MG capsule Take 25 mg by mouth 2 (two) times daily as needed for anxiety.  Yes [provider]  ipratropium (ATROVENT) 0.02 % nebulizer solution Take 0.5 mg by nebulization every 6 (six) hours as needed for wheezing or shortness of breath.    Yes [provider]  levothyroxine (SYNTHROID, LEVOTHROID) 25 MCG tablet Take 25 mcg by mouth daily before breakfast.   Yes [provider]  linagliptin (TRADJENTA) 5 MG TABS tablet Take 5 mg by mouth daily.   Yes [provider]  loperamide (IMODIUM) 2 MG capsule Take 2 mg by mouth every 8 (eight) hours as needed for diarrhea or loose stools.    Yes [provider]  LORazepam (ATIVAN) 1 MG tablet Take 1 mg by mouth daily as needed for anxiety.    Yes [provider]    losartan (COZAAR) 100 MG tablet Take 100 mg by mouth daily.   Yes [provider]  montelukast (SINGULAIR) 10 MG tablet Take 10 mg by mouth at bedtime.   Yes [provider]  Multiple Vitamin (DAILY VITE PO) Take 1 tablet by mouth daily.   Yes [provider]  NON FORMULARY 1 each by Other route See admin instructions. CPAP at bedtime. CPAP setting of 15CM of water   Yes [provider]  omeprazole (PRILOSEC) 20 MG capsule Take 20 mg by mouth daily.    Yes [provider]  prazosin (MINIPRESS) 2 MG capsule Take 2 mg by mouth at bedtime. For PTSD associated nightmares   Yes [provider]  pyridOXINE (VITAMIN B-6) 100 MG tablet Take 200 mg by mouth 2 (two) times daily.   Yes [provider]  risperiDONE (RISPERDAL) 0.5 MG tablet Take 0.5 mg by mouth 2 (two) times daily.    Yes [provider]  spironolactone (ALDACTONE) 25 MG tablet Take 1 tablet (25 mg total) by mouth daily. 04/12/18  Yes Mesner, Corene Cornea, MD  torsemide (DEMADEX) 10 MG tablet Take 40 mg by mouth daily.  04/29/17  Yes [provider]  traZODone (DESYREL) 100 MG tablet Take 100 mg by mouth at bedtime as needed for sleep.    Yes [provider]  doxycycline (VIBRAMYCIN) 100 MG capsule Take 1 capsule (100 mg total) by mouth 2 (two) times daily. Patient not taking: Reported on 04/18/2018 04/04/18   Recardo Evangelist, PA-C  EPINEPHrine (EPI-PEN) 0.3 mg/0.3 mL DEVI Inject 0.3 mg into the muscle once.    [provider]      ANALYSIS: A 16 channel recording using standard 10 20 measurements is conducted for 22 minutes.  There is a well-formed posterior dominant rhythm of 8-8.5 Hz which attenuates with eye-opening.  There is beta activity observed in the frontal areas.  Awake and drowsy activities are mostly seen although there is occasionally rudimentary spindles and K complexes.  Photic stimulation and hyperventilation are not conducted.  There  is no focal or lateralized slowing.  There is no epileptiform activity is observed.   IMPRESSION: This is a normal recording of the awake and drowsy states.      Bethzaida Boord A. Merlene Laughter, M.D.  Diplomate, Tax adviser of Psychiatry and Neurology ( Neurology).

## 2018-04-20 NOTE — Progress Notes (Signed)
PROGRESS NOTE    [name]  MWN:[id number] DOB: 1950/08/18 DOA: 04/18/2018 PCP: Madelyn Brunner, MD     Brief Narrative:  67 year old woman admitted from assisted living facility on 9/4 due to acute embolic encephalopathy.  Past medical history significant for a myriad of mental health illnesses including schizophrenia, anxiety, depression, PTSD as well as multiple medical comorbidities including diastolic heart failure, hypertension, hyperlipidemia, hypothyroidism and stage III chronic kidney disease.  On my examination on 9/4 she was catatonic, unable to follow commands.  Today she is fully alert and oriented and pleasant.   Assessment & Plan:   Principal Problem:   Altered mental status Active Problems:   Diabetic peripheral neuropathy (HCC)   Anemia   Schizophrenia (HCC)   Depression   Essential hypertension   Chronic diastolic heart failure (HCC)   CKD (chronic kidney disease) stage 3, GFR 30-59 ml/min (HCC)   Type 2 diabetes mellitus ([place])   Acute encephalopathy -Etiology remains unclear. -Appreciate neurology and psychiatry input and recommendations. -MRI was negative. -Neurology believes EEG needs to be done to rule out seizures, I agree. -Psychogenic causes are also a possibility but a diagnosis of exclusion. -Psychiatry agrees with continuing her medications and outpatient follow-up with her psychiatrist at [facility].  Depression -Continue to duloxetine.  Hypertension -Well-controlled, continue carvedilol, torsemide, losartan, spironolactone  Schizophrenia -Continue Risperdal at home dose  Chronic diastolic heart failure -Currently compensated.  Chronic kidney disease stage III -At baseline renal function.  Type 2 diabetes -Fair control, continue all medications.   DVT prophylaxis: Subcutaneous heparin Code Status: Full code Family Communication: Patient only Disposition Plan: Back to ALF once stable, anticipate 24 hours  Consultants:    Neurology  Psychiatry  Procedures:   EEG pending  Antimicrobials:  Anti-infectives (From admission, onward)   None       Subjective: Lying in bed, pleasant, no complaints  Objective: Vitals:   04/19/18 2235 04/19/18 2303 04/20/18 0555 04/20/18 0818  BP:  (!) 171/76 138/70   Pulse:  69 75   Resp:  16 16   Temp:  98.3 F (36.8 C) 97.6 F (36.4 C)   TempSrc:  Oral Oral   SpO2: 96% 98% 97% 96%  Weight:      Height:        Intake/Output Summary (Last 24 hours) at 04/20/2018 1035 Last data filed at 04/20/2018 0600 Gross per 24 hour  Intake 120 ml  Output 2550 ml  Net -2430 ml   Filed Weights   04/18/18 2302  Weight: 100.7 kg    Examination:  General exam: Alert, awake, oriented x 3, morbidly obese Respiratory system: Clear to auscultation. Respiratory effort normal. Cardiovascular system:RRR. No murmurs, rubs, gallops. Gastrointestinal system: Abdomen is obese, nondistended, soft and nontender. No organomegaly or masses felt. Normal bowel sounds heard. Central nervous system: Alert and oriented. No focal neurological deficits. Extremities: No C/C/E, +pedal pulses Skin: No rashes, lesions or ulcers Psychiatry: . Mood & affect appropriate.     Data Reviewed: I have personally reviewed following labs and imaging studies  CBC: Recent Labs  Lab 04/18/18 2330  WBC 8.8  NEUTROABS 6.3  HGB 9.8*  HCT 28.5*  MCV 96.0  PLT 403   Basic Metabolic Panel: Recent Labs  Lab 04/18/18 2330  NA 137  K 3.8  CL 105  CO2 23  GLUCOSE 240*  BUN 42*  CREATININE 2.34*  CALCIUM 8.4*   GFR: Estimated Creatinine Clearance: 25.8 mL/min (A) (by C-G formula based on SCr of  2.34 mg/dL (H)). Liver Function Tests: Recent Labs  Lab 04/18/18 2330  AST 17  ALT 23  ALKPHOS 82  BILITOT 0.6  PROT 6.2*  ALBUMIN 2.9*   No results for input(s): LIPASE, AMYLASE in the last 168 hours. Recent Labs  Lab 04/18/18 2330  AMMONIA 16   Coagulation Profile: No results for  input(s): INR, PROTIME in the last 168 hours. Cardiac Enzymes: Recent Labs  Lab 04/18/18 2330  CKTOTAL 105   BNP (last 3 results) No results for input(s): PROBNP in the last 8760 hours. HbA1C: No results for input(s): HGBA1C in the last 72 hours. CBG: Recent Labs  Lab 04/19/18 0831 04/19/18 1204 04/19/18 1601 04/19/18 2310 04/20/18 0815  GLUCAP 212* 188* 152* 139* 212*   Lipid Profile: No results for input(s): CHOL, HDL, LDLCALC, TRIG, CHOLHDL, LDLDIRECT in the last 72 hours. Thyroid Function Tests: Recent Labs    04/18/18 2330  TSH 3.747   Anemia Panel: No results for input(s): VITAMINB12, FOLATE, FERRITIN, TIBC, IRON, RETICCTPCT in the last 72 hours. Urine analysis:    Component Value Date/Time   COLORURINE STRAW (A) 04/19/2018 0132   APPEARANCEUR CLEAR 04/19/2018 0132   LABSPEC 1.008 04/19/2018 0132   PHURINE 6.0 04/19/2018 0132   GLUCOSEU 150 (A) 04/19/2018 0132   HGBUR NEGATIVE 04/19/2018 0132   HGBUR negative 10/20/2009 1425   BILIRUBINUR NEGATIVE 04/19/2018 0132   KETONESUR NEGATIVE 04/19/2018 0132   PROTEINUR 100 (A) 04/19/2018 0132   UROBILINOGEN 0.2 10/05/2011 1002   NITRITE NEGATIVE 04/19/2018 0132   LEUKOCYTESUR NEGATIVE 04/19/2018 0132   Sepsis Labs: _0 (procalcitonin:4,lacticidven:4)  ) Recent Results (from the past 240 hour(s))  Blood culture (routine x 2)     Status: None (Preliminary result)   Collection Time: 04/18/18 11:30 PM  Result Value Ref Range Status   Specimen Description BLOOD LEFT ARM  Final   Special Requests   Final    BOTTLES DRAWN AEROBIC AND ANAEROBIC Blood Culture adequate volume   Culture   Final    NO GROWTH 2 DAYS Performed at [facility], [address], [place], [place] [address]    Report Status PENDING  Incomplete  Blood culture (routine x 2)     Status: None (Preliminary result)   Collection Time: 04/18/18 11:34 PM  Result Value Ref Range Status   Specimen Description BLOOD RIGHT HAND  Final   Special  Requests   Final    BOTTLES DRAWN AEROBIC AND ANAEROBIC Blood Culture adequate volume   Culture   Final    NO GROWTH 2 DAYS Performed at [facility], [address] [place], [place] [address]    Report Status PENDING  Incomplete  MRSA PCR Screening     Status: None   Collection Time: 04/19/18  4:23 AM  Result Value Ref Range Status   MRSA by PCR NEGATIVE NEGATIVE Final    Comment:        The GeneXpert MRSA Assay (FDA approved for NASAL specimens only), is one component of a comprehensive MRSA colonization surveillance program. It is not intended to diagnose MRSA infection nor to guide or monitor treatment for MRSA infections. Performed at [facility], [address] [place], [place] [address]          Radiology Studies: Ct Head Wo Contrast  Result Date: 04/19/2018 CLINICAL DATA:  Unresponsive this evening. EXAM: CT HEAD WITHOUT CONTRAST TECHNIQUE: Contiguous axial images were obtained from the base of the skull through the vertex without intravenous contrast. COMPARISON:  04/12/2018 FINDINGS:  Brain: No evidence of acute infarction, hemorrhage, hydrocephalus, extra-axial collection or mass lesion/mass effect. Vascular: No hyperdense vessel or unexpected calcification. Skull: Normal. Negative for fracture or focal lesion. Sinuses/Orbits: No acute finding. Other: None. IMPRESSION: Negative head CT. Electronically Signed   By: Lucienne Capers M.D.   On: 04/19/2018 00:02   Mr Brain Wo Contrast  Result Date: 04/19/2018 CLINICAL DATA:  67 y/o F; acute encephalopathy and catatonic. Nonverbal. EXAM: MRI HEAD WITHOUT CONTRAST TECHNIQUE: Multiplanar, multiecho pulse sequences of the brain and surrounding structures were obtained without intravenous contrast. COMPARISON:  04/18/2018 CT head. FINDINGS: Brain: No acute infarction, hemorrhage, hydrocephalus, extra-axial collection or mass lesion. Several stable nonspecific T2 FLAIR hyperintensities in subcortical and periventricular white  matter are compatible with mild chronic microvascular ischemic changes for age. Mild volume loss of the brain. 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 Vascular: Normal flow voids. Skull and upper cervical spine: Normal marrow signal. Sinuses/Orbits: Mild paranasal sinus disease with maxillary sinus mucous retention cyst. Bilateral intra-ocular lens replacement. Partial opacification of the mastoid air cells. Other: None. IMPRESSION: 1. No acute intracranial abnormality. 2. Stable mild chronic microvascular ischemic changes and volume loss of the brain. 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 3. Mild paranasal sinus disease. Electronically Signed   By: Kristine Garbe M.D.   On: 04/19/2018 15:41   Dg Chest Portable 1 View  Result Date: 04/18/2018 CLINICAL DATA:  Altered mental status EXAM: PORTABLE CHEST 1 VIEW COMPARISON:  03/24/2018, 01/12/2018 FINDINGS: Cardiomegaly with mild central congestion. Low lung volumes. No focal consolidation or pleural effusion. No pneumothorax. IMPRESSION: Cardiomegaly with mild central vascular congestion Electronically Signed   By: Donavan Foil M.D.   On: 04/18/2018 23:38        Scheduled Meds: . acetaminophen  325 mg Oral BID  . aspirin EC  81 mg Oral Daily  . atorvastatin  40 mg Oral Daily  . carvedilol  25 mg Oral BID WC  . cholecalciferol  4,000 Units Oral Daily  . docusate sodium  100 mg Oral Daily  . DULoxetine  60 mg Oral Daily  . gabapentin  300 mg Oral BID  . gabapentin  600 mg Oral QHS  . glipiZIDE  5 mg Oral Q breakfast  . heparin  5,000 Units Subcutaneous Q8H  . insulin aspart  0-9 Units Subcutaneous TID WC  . levothyroxine  25 mcg Oral QAC breakfast  . linagliptin  5 mg Oral Daily  . losartan  100 mg Oral Daily  . mometasone-formoterol  2 puff Inhalation BID  . montelukast  10 mg Oral QHS  . nystatin   Topical BID  . pantoprazole  40 mg Oral Daily  . prazosin  2 mg Oral QHS  . pyridOXINE   200 mg Oral BID  . risperiDONE  0.5 mg Oral BID  . spironolactone  25 mg Oral Daily  . torsemide  40 mg Oral Daily   Continuous Infusions:   LOS: 1 day    Time spent: 35 minutes. Greater than 50% of this time was spent in direct contact with the patient, coordinating care and discussing relevant ongoing clinical issues, including plan to await results of EEG to see if seizures may be within the differential, plan to likely discharge back to ALF within 24 hours.     Lelon Frohlich, MD [facility] Pager [phone]  If 7PM-7AM, please contact night-coverage [email or web address] Password [email or web address] 04/20/2018, 10:35 AM

## 2018-04-20 NOTE — Progress Notes (Signed)
EEG completed; results pending.    

## 2018-04-20 NOTE — Progress Notes (Signed)
Alfalfa A. Merlene Laughter, MD     [email or web address]          [name] is an 67 y.o. female.   Assessment/Plan: 1. Unexplained encephalopathy:  Given the spontaneous improvement unwitnessed seizure is a possibility.  Medication effect may also be initially given the chronic renal impairment.  Psychogenic causes are also a possibility but a diagnosis of exclusion.    EEG is normal but does not completely rule out unwitnessed seizures.  Minimize psychotropic medications.  Physical and occupational therapies are recommended.  2. Flaccid weakness of the upper and lower extremities like related to 1.  3. OSA:   May restart CPAP machine.    She reported that she is doing well.  No recurrent episodes of confusion or weakness are reported.     GENERAL: She is in no acute distress at this time.  HEENT:    No trauma appreciated neck is supple.    SKIN: Normal by inspection.    MENTAL STATUS: Alert and oriented -   Hospital year and month. Speech, language and cognition are generally  Fine although there appears to be low academic achievement.   CRANIAL NERVES: Pupils are equal, round and reactive to light and accommodation; extraocular movements are full, there is no significant nystagmus; upper and lower facial muscles are normal in strength and symmetric, there is no flattening of the nasolabial folds; tongue is midline; uvula is midline; shoulder elevation is normal.  MOTOR: Normal tone, bulk and strength -  Except the left lower extremity worse 4/5; no pronator drift.  COORDINATION: Left finger to nose is normal, right finger to nose is normal, No rest tremor; no intention tremor; no postural tremor; no bradykinesia.    EEG is normal.   Objective: Vital signs in last 24 hours: Temp:  [97.6 F (36.4 C)-98.3 F (36.8 C)] 98.2 F (36.8 C) (09/05 1313) Pulse Rate:  [69-75] 73 (09/05 1313) Resp:  [16] 16 (09/05 1313) BP: (138-171)/(70-76) 152/72  (09/05 1313) SpO2:  [96 %-98 %] 98 % (09/05 1313)  Intake/Output from previous day: 09/04 0701 - 09/05 0700 In: 120 [P.O.:120] Out: 2550 [Urine:2550] Intake/Output this shift: Total I/O In: 720 [P.O.:720] Out: 1650 [Urine:1650] Nutritional status:  Diet Order            Diet heart healthy/carb modified Room service appropriate? Yes; Fluid consistency: Thin  Diet effective now               Lab Results: Results for orders placed or performed during the hospital encounter of 04/18/18 (from the past 48 hour(s))  Blood gas, arterial ([facility] & [facility] ONLY)     Status: Abnormal   Collection Time: 04/18/18 11:15 PM  Result Value Ref Range   Delivery systems ROOM AIR    pH, Arterial 7.410 7.350 - 7.450   pCO2 arterial 36.8 32.0 - 48.0 mmHg   pO2, Arterial 69.8 (L) 83.0 - 108.0 mmHg   Bicarbonate 23.6 20.0 - 28.0 mmol/L   Acid-base deficit 1.1 0.0 - 2.0 mmol/L   O2 Saturation 93.9 %   Patient temperature 37.0    Collection site RIGHT RADIAL    Drawn by [phone]    Sample type ARTERIAL    Allens test (pass/fail) PASS PASS    Comment: Performed at [facility] [place] [facility], [address] [place], [place] [address]  CBC with Differential/Platelet     Status: Abnormal   Collection Time: 04/18/18 11:30 PM  Result Value Ref Range   WBC 8.8 4.0 -  10.5 K/uL   RBC 2.97 (L) 3.87 - 5.11 MIL/uL   Hemoglobin 9.8 (L) 12.0 - 15.0 g/dL   HCT 28.5 (L) 36.0 - 46.0 %   MCV 96.0 78.0 - 100.0 fL   MCH 33.0 26.0 - 34.0 pg   MCHC 34.4 30.0 - 36.0 g/dL   RDW 12.9 11.5 - 15.5 %   Platelets 173 150 - 400 K/uL   Neutrophils Relative % 71 %   Neutro Abs 6.3 1.7 - 7.7 K/uL   Lymphocytes Relative 19 %   Lymphs Abs 1.7 0.7 - 4.0 K/uL   Monocytes Relative 6 %   Monocytes Absolute 0.5 0.1 - 1.0 K/uL   Eosinophils Relative 3 %   Eosinophils Absolute 0.3 0.0 - 0.7 K/uL   Basophils Relative 1 %   Basophils Absolute 0.1 0.0 - 0.1 K/uL    Comment: Performed at [facility], [address], [place], [place] [address]    Comprehensive metabolic panel     Status: Abnormal   Collection Time: 04/18/18 11:30 PM  Result Value Ref Range   Sodium 137 135 - 145 mmol/L   Potassium 3.8 3.5 - 5.1 mmol/L   Chloride 105 98 - 111 mmol/L   CO2 23 22 - 32 mmol/L   Glucose, Bld 240 (H) 70 - 99 mg/dL   BUN 42 (H) 8 - 23 mg/dL   Creatinine, Ser 2.34 (H) 0.44 - 1.00 mg/dL   Calcium 8.4 (L) 8.9 - 10.3 mg/dL   Total Protein 6.2 (L) 6.5 - 8.1 g/dL   Albumin 2.9 (L) 3.5 - 5.0 g/dL   AST 17 15 - 41 U/L   ALT 23 0 - 44 U/L   Alkaline Phosphatase 82 38 - 126 U/L   Total Bilirubin 0.6 0.3 - 1.2 mg/dL   GFR calc non Af Amer 21 (L) >60 mL/min   GFR calc Af Amer 24 (L) >60 mL/min    Comment: (NOTE) The eGFR has been calculated using the CKD EPI equation. This calculation has not been validated in all clinical situations. eGFR's persistently <60 mL/min signify possible Chronic Kidney Disease.    Anion gap 9 5 - 15    Comment: Performed at [facility], [address] [place], [place] [address]  Ethanol     Status: None   Collection Time: 04/18/18 11:30 PM  Result Value Ref Range   Alcohol, Ethyl (B) <10 <10 mg/dL    Comment: Performed at [facility], [address] [place], [place] [address]  Acetaminophen level     Status: Abnormal   Collection Time: 04/18/18 11:30 PM  Result Value Ref Range   Acetaminophen (Tylenol), Serum <10 (L) 10 - 30 ug/mL    Comment: (NOTE) Therapeutic concentrations vary significantly. A range of 10-30 ug/mL  may be an effective concentration for many patients. However, some  are best treated at concentrations outside of this range. Acetaminophen concentrations >150 ug/mL at 4 hours after ingestion  and >50 ug/mL at 12 hours after ingestion are often associated with  toxic reactions. Performed at [facility], [address], [place], [place] [address]   Salicylate level     Status: None   Collection Time: 04/18/18 11:30 PM  Result Value Ref Range   Salicylate Lvl <4.0 2.8 - 30.0 mg/dL     Comment: Performed at [facility], [address] [place], [place] [address]  Ammonia     Status: None   Collection Time: 04/18/18 11:30 PM  Result Value Ref Range  Ammonia 16 9 - 35 umol/L    Comment: Performed at [facility], [address], [place], [place] [address]  Blood culture (routine x 2)     Status: None (Preliminary result)   Collection Time: 04/18/18 11:30 PM  Result Value Ref Range   Specimen Description BLOOD LEFT ARM    Special Requests      BOTTLES DRAWN AEROBIC AND ANAEROBIC Blood Culture adequate volume   Culture      NO GROWTH 2 DAYS Performed at [facility], [address], [place], [place] [address]    Report Status PENDING   TSH     Status: None   Collection Time: 04/18/18 11:30 PM  Result Value Ref Range   TSH 3.747 0.350 - 4.500 uIU/mL    Comment: Performed by a 3rd Generation assay with a functional sensitivity of <=0.01 uIU/mL. Performed at [facility], [address], [place], [place] [address]   CK     Status: None   Collection Time: 04/18/18 11:30 PM  Result Value Ref Range   Total CK 105 38 - 234 U/L    Comment: Performed at [facility], [address], [place], [place] [address]  Blood culture (routine x 2)     Status: None (Preliminary result)   Collection Time: 04/18/18 11:34 PM  Result Value Ref Range   Specimen Description BLOOD RIGHT HAND    Special Requests      BOTTLES DRAWN AEROBIC AND ANAEROBIC Blood Culture adequate volume   Culture      NO GROWTH 2 DAYS Performed at [facility], [address] [place], [place] [address]    Report Status PENDING   CBG monitoring, ED     Status: Abnormal   Collection Time: 04/19/18 12:35 AM  Result Value Ref Range   Glucose-Capillary 222 (H) 70 - 99 mg/dL  I-Stat CG4 Lactic Acid, ED     Status: None   Collection Time: 04/19/18 12:36 AM  Result Value Ref Range   Lactic Acid, Venous 1.80 0.5 - 1.9 mmol/L  Rapid urine drug screen (hospital performed)     Status: None   Collection Time: 04/19/18   1:32 AM  Result Value Ref Range   Opiates NONE DETECTED NONE DETECTED   Cocaine NONE DETECTED NONE DETECTED   Benzodiazepines NONE DETECTED NONE DETECTED   Amphetamines NONE DETECTED NONE DETECTED   Tetrahydrocannabinol NONE DETECTED NONE DETECTED   Barbiturates NONE DETECTED NONE DETECTED    Comment: (NOTE) DRUG SCREEN FOR MEDICAL PURPOSES ONLY.  IF CONFIRMATION IS NEEDED FOR ANY PURPOSE, NOTIFY LAB WITHIN 5 DAYS. LOWEST DETECTABLE LIMITS FOR URINE DRUG SCREEN Drug Class                     Cutoff (ng/mL) Amphetamine and metabolites    1000 Barbiturate and metabolites    200 Benzodiazepine                 297 Tricyclics and metabolites     300 Opiates and metabolites        300 Cocaine and metabolites        300 THC                            50 Performed at [facility], [address] [place], [place] [address]   Urinalysis, Routine w reflex microscopic     Status: Abnormal   Collection Time: 04/19/18  1:32 AM  Result  Value Ref Range   Color, Urine STRAW (A) YELLOW   APPearance CLEAR CLEAR   Specific Gravity, Urine 1.008 1.005 - 1.030   pH 6.0 5.0 - 8.0   Glucose, UA 150 (A) NEGATIVE mg/dL   Hgb urine dipstick NEGATIVE NEGATIVE   Bilirubin Urine NEGATIVE NEGATIVE   Ketones, ur NEGATIVE NEGATIVE mg/dL   Protein, ur 100 (A) NEGATIVE mg/dL   Nitrite NEGATIVE NEGATIVE   Leukocytes, UA NEGATIVE NEGATIVE   RBC / HPF 0-5 0 - 5 RBC/hpf   WBC, UA 0-5 0 - 5 WBC/hpf   Bacteria, UA NONE SEEN NONE SEEN   Squamous Epithelial / LPF 0-5 0 - 5    Comment: Performed at [facility], [address], [place], [place] [address]  MRSA PCR Screening     Status: None   Collection Time: 04/19/18  4:23 AM  Result Value Ref Range   MRSA by PCR NEGATIVE NEGATIVE    Comment:        The GeneXpert MRSA Assay (FDA approved for NASAL specimens only), is one component of a comprehensive MRSA colonization surveillance program. It is not intended to diagnose MRSA infection nor to guide  or monitor treatment for MRSA infections. Performed at [facility], [address] [place], [place] [address]   Glucose, capillary     Status: Abnormal   Collection Time: 04/19/18  8:31 AM  Result Value Ref Range   Glucose-Capillary 212 (H) 70 - 99 mg/dL   Comment 1 Notify RN    Comment 2 Document in Chart   Glucose, capillary     Status: Abnormal   Collection Time: 04/19/18 12:04 PM  Result Value Ref Range   Glucose-Capillary 188 (H) 70 - 99 mg/dL   Comment 1 Notify RN    Comment 2 Document in Chart   Glucose, capillary     Status: Abnormal   Collection Time: 04/19/18  4:01 PM  Result Value Ref Range   Glucose-Capillary 152 (H) 70 - 99 mg/dL  Glucose, capillary     Status: Abnormal   Collection Time: 04/19/18 11:10 PM  Result Value Ref Range   Glucose-Capillary 139 (H) 70 - 99 mg/dL  Glucose, capillary     Status: Abnormal   Collection Time: 04/20/18  8:15 AM  Result Value Ref Range   Glucose-Capillary 212 (H) 70 - 99 mg/dL   Comment 1 Notify RN   Glucose, capillary     Status: Abnormal   Collection Time: 04/20/18 11:57 AM  Result Value Ref Range   Glucose-Capillary 222 (H) 70 - 99 mg/dL   Comment 1 Notify RN   Glucose, capillary     Status: Abnormal   Collection Time: 04/20/18  4:23 PM  Result Value Ref Range   Glucose-Capillary 187 (H) 70 - 99 mg/dL    Lipid Panel No results for input(s): CHOL, TRIG, HDL, CHOLHDL, VLDL, LDLCALC in the last 72 hours.  Studies/Results:   Medications:  Scheduled Meds: . acetaminophen  325 mg Oral BID  . aspirin EC  81 mg Oral Daily  . atorvastatin  40 mg Oral Daily  . carvedilol  25 mg Oral BID WC  . cholecalciferol  4,000 Units Oral Daily  . docusate sodium  100 mg Oral Daily  . DULoxetine  60 mg Oral Daily  . gabapentin  300 mg Oral BID  . gabapentin  600 mg Oral QHS  . glipiZIDE  5 mg Oral Q breakfast  . heparin  5,000 Units Subcutaneous Q8H  .  insulin aspart  0-9 Units Subcutaneous TID WC  . levothyroxine  25 mcg Oral  QAC breakfast  . linagliptin  5 mg Oral Daily  . losartan  100 mg Oral Daily  . mometasone-formoterol  2 puff Inhalation BID  . montelukast  10 mg Oral QHS  . nystatin   Topical BID  . pantoprazole  40 mg Oral Daily  . prazosin  2 mg Oral QHS  . pyridOXINE  200 mg Oral BID  . risperiDONE  0.5 mg Oral BID  . spironolactone  25 mg Oral Daily  . torsemide  40 mg Oral Daily   Continuous Infusions: PRN Meds:.acetaminophen **OR** acetaminophen, hydrocortisone, hydrOXYzine, ipratropium-albuterol, loperamide, LORazepam, ondansetron **OR** ondansetron (ZOFRAN) IV, traZODone     LOS: 1 day   Sharaine Delange A. Merlene Laughter, M.D.  Diplomate, Tax adviser of Psychiatry and Neurology ( Neurology).

## 2018-04-20 NOTE — Clinical Social Work Note (Signed)
Clinical Social Work Assessment  Patient Details  Name: [name] MRN: [id number] Date of Birth: 04-16-1951  Date of referral:  04/19/18               Reason for consult:  Other (Comment Required)(From [place])                Permission sought to share information with:    Permission granted to share information::     Name::        Agency::  [name] at [facility]  Relationship::     Contact Information:     Housing/Transportation Living arrangements for the past 2 months:  [place] of Information:  Facility Patient Interpreter Needed:  None Criminal Activity/Legal Involvement Pertinent to Current Situation/Hospitalization:  No - Comment as needed Significant Relationships:  None Lives with:  Facility Resident Do you feel safe going back to the place where you live?  Yes Need for family participation in patient care:  Yes (Comment)  Care giving concerns:  Staff provides assistance with bathing, toileting dressing and grooming.    Social Worker assessment / plan:  [name] at [facility] provided history. At baseline, patient ambulates with a walker.  She has some episodes of incontinence. Patient goes to the [place] center three on Monday, Wednesday and Friday.  Patient is talkative, alert and oriented at baseline. Facility is agreeable for patient return. Patient has no family support. She has been a resident at [facility] since 03/03/18.   Employment status:  Retired Forensic scientist:  Medicare PT Recommendations:  Not assessed at this time Information / Referral to community resources:     Patient/Family's Response to care: Patient status was reviewed with facility.   Patient/Family's Understanding of and Emotional Response to Diagnosis, Current Treatment, and Prognosis:  Facility will be informed of patient's diagnosis, treatment and prognosis.   Emotional Assessment Appearance:  Appears stated age Attitude/Demeanor/Rapport:    Affect (typically  observed):  Accepting, Calm Orientation:  Oriented to Self, Oriented to Place, Oriented to  Time, Oriented to Situation Alcohol / Substance use:  Not Applicable Psych involvement (Current and /or in the community):  No (Comment)  Discharge Needs  Concerns to be addressed:  No discharge needs identified Readmission within the last 30 days:  Yes Current discharge risk:  None Barriers to Discharge:  No Barriers Identified   Ihor Gully, LCSW 04/20/2018, 10:38 AM

## 2018-04-21 DIAGNOSIS — F061 Catatonic disorder due to known physiological condition: Secondary | ICD-10-CM | POA: Diagnosis not present

## 2018-04-21 DIAGNOSIS — I130 Hypertensive heart and chronic kidney disease with heart failure and stage 1 through stage 4 chronic kidney disease, or unspecified chronic kidney disease: Secondary | ICD-10-CM | POA: Diagnosis not present

## 2018-04-21 DIAGNOSIS — R4182 Altered mental status, unspecified: Secondary | ICD-10-CM | POA: Diagnosis not present

## 2018-04-21 DIAGNOSIS — F202 Catatonic schizophrenia: Secondary | ICD-10-CM | POA: Diagnosis not present

## 2018-04-21 DIAGNOSIS — Z6841 Body Mass Index (BMI) 40.0 and over, adult: Secondary | ICD-10-CM | POA: Diagnosis not present

## 2018-04-21 DIAGNOSIS — I5032 Chronic diastolic (congestive) heart failure: Secondary | ICD-10-CM | POA: Diagnosis not present

## 2018-04-21 DIAGNOSIS — G9340 Encephalopathy, unspecified: Secondary | ICD-10-CM | POA: Diagnosis not present

## 2018-04-21 DIAGNOSIS — E039 Hypothyroidism, unspecified: Secondary | ICD-10-CM | POA: Diagnosis not present

## 2018-04-21 LAB — BASIC METABOLIC PANEL
Anion gap: 7 (ref 5–15)
BUN: 42 mg/dL — ABNORMAL HIGH (ref 8–23)
CO2: 28 mmol/L (ref 22–32)
Calcium: 9 mg/dL (ref 8.9–10.3)
Chloride: 105 mmol/L (ref 98–111)
Creatinine, Ser: 2.37 mg/dL — ABNORMAL HIGH (ref 0.44–1.00)
GFR calc Af Amer: 23 mL/min — ABNORMAL LOW (ref >60)
GFR calc non Af Amer: 20 mL/min — ABNORMAL LOW (ref >60)
Glucose, Bld: 194 mg/dL — ABNORMAL HIGH (ref 70–99)
Potassium: 4.2 mmol/L (ref 3.5–5.1)
Sodium: 140 mmol/L (ref 135–145)

## 2018-04-21 LAB — GLUCOSE, CAPILLARY
Glucose-Capillary: 171 mg/dL — ABNORMAL HIGH (ref 70–99)
Glucose-Capillary: 277 mg/dL — ABNORMAL HIGH (ref 70–99)

## 2018-04-21 NOTE — NC FL2 (Signed)
[place] LEVEL OF CARE SCREENING TOOL     IDENTIFICATION  Patient Name: [name] Birthdate: [birth year] Sex: female Admission Date (Current Location): 04/18/2018  [facility] and [place] Number:  [employer] and Address:  [facility] [address], [place]      Provider Number: [id number]  Attending Physician Name and Address:  Isaac Bliss, Olam Idler*  Relative Name and Phone Number:       Current Level of Care: Hospital Recommended Level of Care:   Prior Approval Number:    Date Approved/Denied:   PASRR Number:    Discharge Plan: Other (Comment)(ALF )    Current Diagnoses: Patient Active Problem List   Diagnosis Date Noted  . Type 2 diabetes mellitus ([place]) 04/19/2018  . Acute kidney injury superimposed on chronic kidney disease ([place]) 01/13/2018  . Altered mental status   . Encephalopathy 10/11/2016  . Chest pain 08/12/2016  . CKD (chronic kidney disease) stage 3, GFR 30-59 ml/min (HCC) 08/12/2016  . History of pulmonary embolus (PE) 08/12/2016  . Obesity hypoventilation syndrome ([place]) 08/12/2016  . RBBB 08/12/2016  . Positive D dimer 08/12/2016  . CVA (cerebral infarction) 11/10/2015  . Physical deconditioning 07/01/2011  . Weakness 07/01/2011  . Diabetic foot ulcer ([place]) 05/19/2011  . Polypharmacy 02/09/2011  . BACK PAIN WITH RADICULOPATHY 05/25/2010  . Type 2 diabetes mellitus with diabetic polyneuropathy, without long-term current use of insulin ([place]) 08/18/2009  . Chronic diastolic heart failure ([place]) 04/25/2009  . Anemia 04/14/2009  . Sleep apnea 12/11/2008  . Diabetic peripheral neuropathy ([place]) 05/04/2007  . HYPERCHOLESTEROLEMIA 10/13/2006  . Gout, unspecified 10/13/2006  . HYPOKALEMIA 10/13/2006  . Schizophrenia ([place]) 10/13/2006  . Depression 10/13/2006  . Essential hypertension 10/13/2006  . Venous (peripheral) insufficiency 10/13/2006  . RHINITIS, ALLERGIC 10/13/2006  . Asthma 10/13/2006  .  Reflux esophagitis 10/13/2006  . OSTEOARTHRITIS, MULTI SITES 10/13/2006  . INCONTINENCE, URGE 10/13/2006    Orientation RESPIRATION BLADDER Height & Weight     Self, Time, Situation, Place  Normal Incontinent Weight: 222 lb 0.1 oz (100.7 kg) Height:  5\' 1"  (154.9 cm)  BEHAVIORAL SYMPTOMS/MOOD NEUROLOGICAL BOWEL NUTRITION STATUS      Incontinent (at hospital: low sodium/heart healthy. Facility states that patient was on a reduced concentrated sweets diet in the facility)  AMBULATORY STATUS COMMUNICATION OF NEEDS Skin   Limited Assist(uses walker ) Verbally Normal                       Personal Care Assistance Level of Assistance  Bathing, Feeding, Dressing Bathing Assistance: Limited assistance Feeding assistance: Independent Dressing Assistance: Limited assistance     Functional Limitations Info  Sight, Hearing, Speech Sight Info: Adequate Hearing Info: Adequate Speech Info: Adequate    SPECIAL CARE FACTORS FREQUENCY                       Contractures Contractures Info: Not present    Additional Factors Info  Code Status, Allergies, Psychotropic Code Status Info: full code Allergies Info: benzatropine mesylate, Codeine, diazepam, diphenhydramine Hcl, Penicillins, Sulfonamide derivatives, thoioridazine hcl, Lisinopril  Psychotropic Info: Cymbalta, ativan, risperdal, desyrel          Current Medications (04/21/2018):  This is the current hospital active medication list Current Facility-Administered Medications  Medication Dose Route Frequency Provider Last Rate Last Dose  . acetaminophen (TYLENOL) tablet 650 mg  650 mg Oral Q6H PRN Reubin Milan, MD  Or  . acetaminophen (TYLENOL) suppository 650 mg  650 mg Rectal Q6H PRN Reubin Milan, MD      . acetaminophen (TYLENOL) tablet 325 mg  325 mg Oral BID Reubin Milan, MD   325 mg at 04/21/18 0836  . aspirin EC tablet 81 mg  81 mg Oral Daily Reubin Milan, MD   81 mg at 04/21/18 7902   . atorvastatin (LIPITOR) tablet 40 mg  40 mg Oral Daily Reubin Milan, MD   40 mg at 04/21/18 0835  . carvedilol (COREG) tablet 25 mg  25 mg Oral BID WC Reubin Milan, MD   25 mg at 04/21/18 0835  . cholecalciferol (VITAMIN D) tablet 4,000 Units  4,000 Units Oral Daily Reubin Milan, MD   4,000 Units at 04/21/18 [phone]  . docusate sodium (COLACE) capsule 100 mg  100 mg Oral Daily Reubin Milan, MD   100 mg at 04/21/18 3532  . DULoxetine (CYMBALTA) DR capsule 60 mg  60 mg Oral Daily Reubin Milan, MD   60 mg at 04/21/18 0834  . gabapentin (NEURONTIN) capsule 300 mg  300 mg Oral BID Reubin Milan, MD   300 mg at 04/21/18 0834  . gabapentin (NEURONTIN) capsule 600 mg  600 mg Oral QHS Reubin Milan, MD   600 mg at 04/20/18 2250  . glipiZIDE (GLUCOTROL XL) 24 hr tablet 5 mg  5 mg Oral Q breakfast Reubin Milan, MD   5 mg at 04/21/18 0834  . heparin injection 5,000 Units  5,000 Units Subcutaneous Q8H Reubin Milan, MD   5,000 Units at 04/21/18 0617  . hydrocortisone 1 % ointment   Topical Daily PRN Isaac Bliss, Rayford Halsted, MD      . hydrOXYzine (ATARAX/VISTARIL) tablet 25 mg  25 mg Oral BID PRN Isaac Bliss, Rayford Halsted, MD      . insulin aspart (novoLOG) injection 0-9 Units  0-9 Units Subcutaneous TID WC Reubin Milan, MD   5 Units at 04/21/18 1235  . ipratropium-albuterol (DUONEB) 0.5-2.5 (3) MG/3ML nebulizer solution 0.5 mg  0.5 mg Nebulization Q6H PRN Reubin Milan, MD      . levothyroxine (SYNTHROID, LEVOTHROID) tablet 25 mcg  25 mcg Oral QAC breakfast Reubin Milan, MD   25 mcg at 04/21/18 [phone]  . linagliptin (TRADJENTA) tablet 5 mg  5 mg Oral Daily Reubin Milan, MD   5 mg at 04/21/18 0834  . loperamide (IMODIUM) capsule 2 mg  2 mg Oral Q8H PRN Reubin Milan, MD      . LORazepam (ATIVAN) tablet 1 mg  1 mg Oral Daily PRN Reubin Milan, MD      . losartan (COZAAR) tablet 100 mg  100 mg Oral Daily Reubin Milan, MD   100 mg at 04/21/18 2683  . mometasone-formoterol (DULERA) 200-5 MCG/ACT inhaler 2 puff  2 puff Inhalation BID Reubin Milan, MD   2 puff at 04/21/18 [phone]  . montelukast (SINGULAIR) tablet 10 mg  10 mg Oral QHS Reubin Milan, MD   10 mg at 04/20/18 2248  . nystatin (MYCOSTATIN/NYSTOP) topical powder   Topical BID Isaac Bliss, Rayford Halsted, MD      . ondansetron [facility]) tablet 4 mg  4 mg Oral Q6H PRN Reubin Milan, MD       Or  . ondansetron [facility]) injection 4 mg  4 mg Intravenous Q6H PRN Reubin Milan, MD      .  pantoprazole (PROTONIX) EC tablet 40 mg  40 mg Oral Daily Reubin Milan, MD   40 mg at 04/21/18 0834  . prazosin (MINIPRESS) capsule 2 mg  2 mg Oral QHS Reubin Milan, MD   2 mg at 04/20/18 2251  . pyridOXINE (VITAMIN B-6) tablet 200 mg  200 mg Oral BID Reubin Milan, MD   200 mg at 04/20/18 2247  . risperiDONE (RISPERDAL) tablet 0.5 mg  0.5 mg Oral BID Reubin Milan, MD   0.5 mg at 04/21/18 0836  . spironolactone (ALDACTONE) tablet 25 mg  25 mg Oral Daily Reubin Milan, MD   25 mg at 04/21/18 0836  . torsemide (DEMADEX) tablet 40 mg  40 mg Oral Daily Reubin Milan, MD   40 mg at 04/21/18 0836  . traZODone (DESYREL) tablet 100 mg  100 mg Oral QHS PRN Reubin Milan, MD         Discharge Medications: Medication List    STOP taking these medications   doxycycline 100 MG capsule Commonly known as:  VIBRAMYCIN   ipratropium 0.02 % nebulizer solution Commonly known as:  ATROVENT   NON FORMULARY     TAKE these medications   acetaminophen 325 MG tablet Commonly known as:  TYLENOL Take 325 mg by mouth 2 (two) times daily.   albuterol 108 (90 Base) MCG/ACT inhaler Commonly known as:  PROVENTIL HFA;VENTOLIN HFA Inhale 2 puffs into the lungs every 6 (six) hours as needed for wheezing or shortness of breath.   albuterol (2.5 MG/3ML) 0.083% nebulizer solution Commonly known as:   PROVENTIL Take 2.5 mg by nebulization every 6 (six) hours as needed for wheezing or shortness of breath.   aspirin 81 MG tablet Take 81 mg by mouth daily.   atorvastatin 40 MG tablet Commonly known as:  LIPITOR Take 40 mg by mouth daily.   budesonide-formoterol 160-4.5 MCG/ACT inhaler Commonly known as:  SYMBICORT Inhale 2 puffs into the lungs 2 (two) times daily.   carvedilol 25 MG tablet Commonly known as:  COREG Take 25 mg by mouth 2 (two) times daily with a meal.   DAILY VITE PO Take 1 tablet by mouth daily.   docusate sodium 100 MG capsule Commonly known as:  COLACE Take 100 mg by mouth daily.   DULoxetine 60 MG capsule Commonly known as:  CYMBALTA Take 60 mg by mouth daily.   EPINEPHrine 0.3 mg/0.3 mL Devi Commonly known as:  EPI-PEN Inject 0.3 mg into the muscle once.   gabapentin 300 MG capsule Commonly known as:  NEURONTIN Take 300-600 mg by mouth See admin instructions. 300mg  twice daily and 600mg  at bedtime   glipiZIDE 5 MG 24 hr tablet Commonly known as:  GLUCOTROL XL Take 5 mg by mouth daily with breakfast.   hydrocortisone 2.5 % ointment Apply 1 application topically daily as needed (itchy skin).   hydrOXYzine 25 MG capsule Commonly known as:  VISTARIL Take 25 mg by mouth 2 (two) times daily as needed for anxiety.   levothyroxine 25 MCG tablet Commonly known as:  SYNTHROID, LEVOTHROID Take 25 mcg by mouth daily before breakfast.   linagliptin 5 MG Tabs tablet Commonly known as:  TRADJENTA Take 5 mg by mouth daily.   loperamide 2 MG capsule Commonly known as:  IMODIUM Take 2 mg by mouth every 8 (eight) hours as needed for diarrhea or loose stools.   LORazepam 1 MG tablet Commonly known as:  ATIVAN Take 1 mg by mouth daily as  needed for anxiety.   losartan 100 MG tablet Commonly known as:  COZAAR Take 100 mg by mouth daily.   montelukast 10 MG tablet Commonly known as:  SINGULAIR Take 10 mg by mouth at bedtime.    omeprazole 20 MG capsule Commonly known as:  PRILOSEC Take 20 mg by mouth daily.   prazosin 2 MG capsule Commonly known as:  MINIPRESS Take 2 mg by mouth at bedtime. For PTSD associated nightmares   pyridOXINE 100 MG tablet Commonly known as:  VITAMIN B-6 Take 200 mg by mouth 2 (two) times daily.   risperiDONE 0.5 MG tablet Commonly known as:  RISPERDAL Take 0.5 mg by mouth 2 (two) times daily.   spironolactone 25 MG tablet Commonly known as:  ALDACTONE Take 1 tablet (25 mg total) by mouth daily.   torsemide 10 MG tablet Commonly known as:  DEMADEX Take 40 mg by mouth daily.   traZODone 100 MG tablet Commonly known as:  DESYREL Take 100 mg by mouth at bedtime as needed for sleep.   Vitamin D 2000 units Caps Take 4,000 Units by mouth daily.     Relevant Imaging Results:  Relevant Lab Results:   Additional Information SS#: [id number]  Ihor Gully, LCSW

## 2018-04-21 NOTE — Discharge Summary (Signed)
Physician Discharge Summary  Analicia Skibinski MPN:[id number] DOB: 08-Feb-1951 DOA: 04/18/2018  PCP: Madelyn Brunner, MD  Admit date: 04/18/2018 Discharge date: 04/21/2018  Time spent: 45 minutes  Recommendations for Outpatient Follow-up:  -To be discharged back to ALF today. -To follow-up with PCP in 2 weeks.  Discharge Diagnoses:  Principal Problem:   Altered mental status Active Problems:   Diabetic peripheral neuropathy (HCC)   Anemia   Schizophrenia (HCC)   Depression   Essential hypertension   Chronic diastolic heart failure (HCC)   CKD (chronic kidney disease) stage 3, GFR 30-59 ml/min (HCC)   Type 2 diabetes mellitus ([place])   Discharge Condition: Stable and improved  Filed Weights   04/18/18 2302  Weight: 100.7 kg    History of present illness:  As per Dr. Olevia Bowens on 9/4:  Annalena Cross is a 67 y.o. female with medical history significant of anxiety, depression, PTSD, schizophrenia, chronic back pain, varus esophagus, diastolic CHF, stage III CKD, essential hypertension, history of pericarditis, fibromyalgia, GERD, gout, hyperlipidemia, hypothyroidism, memory loss, nephrolithiasis, obesity hypoventilation syndrome CPAP, history of PE, peripheral neuropathy, rheumatoid arthritis, steroid dependent, type 2 diabetes, vitamin D deficiency who was sent from the nursing home due to altered mental status.  Apparently the patient started yelling while she was sleeping with the CPAP mask on.  Since then, the patient has been totally silent, does move and does not respond to questions or speak.  She reflexively blinks and protects her face from falling arm.  ED Course: Initial vital signs temperature 98 F, pulse 73, respirations 20, blood pressure 170/75 mmHg and O2 sat 100%.  Ammonia inhalant was ordered.  Tele-neurology evaluated the patient and deemed the case as a metabolic versus psych etiology.  They do not think that this is a CVA.  Urine toxicology was negative.  Urinalysis  shows straw-colored with glucosuria 150 and proteinuria 100 mg/dL, but otherwise unremarkable.  ABG showed mildly decreased PO2 of 69.8 mmHg, but was otherwise within normal limits.  White count was 8.8, hemoglobin 9.8 g/dL and platelets 173.  Sodium 137, potassium 3.8, chloride 105, CO2 23 and lactic acid 1.8 mmol/L.  Ammonia, alcohol, acetaminophen, salicylate, TSH and total CK are normal.  EKG is not significantly changed when compared to previous tracing.  Her CT head was negative for acute intracranial pathology.  Hospital Course:   Acute encephalopathy -Etiology remains unclear. -Appreciate neurology and psychiatry input and recommendations. -MRI was negative. -EEG was a normal recording, however this does not completely rule out seizure activity. -Psychogenic causes are also a possibility but a diagnosis of exclusion. -Psychiatry agrees with continuing her medications and outpatient follow-up with her psychiatrist at [facility].  Depression -Continue duloxetine.  Hypertension -Well-controlled, continue carvedilol, torsemide, losartan, spironolactone  Schizophrenia -Continue Risperdal at home dose  Chronic diastolic heart failure -Currently compensated.  Chronic kidney disease stage III -At baseline renal function.  Type 2 diabetes -Fair control, continue all medications.  Procedures:  EEG: Normal recording  Consultations:  Neurology  Discharge Instructions  Discharge Instructions    Diet - low sodium heart healthy   Complete by:  As directed    Increase activity slowly   Complete by:  As directed      Allergies as of 04/21/2018      Reactions   Benztropine Mesylate Other (See Comments)   REACTION: Alopecia and white spots on eyes   Codeine Other (See Comments)   REACTION: Mouth Swelling - Tachycardia   Diazepam Other (See Comments)  REACTION: COMA   Diphenhydramine Hcl Other (See Comments)   REACTION: Tachycardia , anxiousness   Penicillins Other  (See Comments)   REACTION: ulcers in mouth, swelling, throat swelling  Has patient had a PCN reaction causing immediate rash, facial/tongue/throat swelling, SOB or lightheadedness with hypotension: YES Has patient had a PCN reaction causing severe rash involving Mucus membranes or skin necrosis: Unknown Has patient had a PCN reaction that required hospitalization Unknown Has patient had a PCN reaction occurring within the last 10 years: unknown If all of the above answers are "NO", then may proceed with Cephalospori   Sulfonamide Derivatives Other (See Comments)   REACTION: rash - blisters   Thioridazine Hcl Other (See Comments)   REACTION: swelling   Lisinopril Other (See Comments)   REACTION: cough      Medication List    STOP taking these medications   doxycycline 100 MG capsule Commonly known as:  VIBRAMYCIN   ipratropium 0.02 % nebulizer solution Commonly known as:  ATROVENT   NON FORMULARY     TAKE these medications   acetaminophen 325 MG tablet Commonly known as:  TYLENOL Take 325 mg by mouth 2 (two) times daily.   albuterol 108 (90 Base) MCG/ACT inhaler Commonly known as:  PROVENTIL HFA;VENTOLIN HFA Inhale 2 puffs into the lungs every 6 (six) hours as needed for wheezing or shortness of breath.   albuterol (2.5 MG/3ML) 0.083% nebulizer solution Commonly known as:  PROVENTIL Take 2.5 mg by nebulization every 6 (six) hours as needed for wheezing or shortness of breath.   aspirin 81 MG tablet Take 81 mg by mouth daily.   atorvastatin 40 MG tablet Commonly known as:  LIPITOR Take 40 mg by mouth daily.   budesonide-formoterol 160-4.5 MCG/ACT inhaler Commonly known as:  SYMBICORT Inhale 2 puffs into the lungs 2 (two) times daily.   carvedilol 25 MG tablet Commonly known as:  COREG Take 25 mg by mouth 2 (two) times daily with a meal.   DAILY VITE PO Take 1 tablet by mouth daily.   docusate sodium 100 MG capsule Commonly known as:  COLACE Take 100 mg by  mouth daily.   DULoxetine 60 MG capsule Commonly known as:  CYMBALTA Take 60 mg by mouth daily.   EPINEPHrine 0.3 mg/0.3 mL Devi Commonly known as:  EPI-PEN Inject 0.3 mg into the muscle once.   gabapentin 300 MG capsule Commonly known as:  NEURONTIN Take 300-600 mg by mouth See admin instructions. 300mg  twice daily and 600mg  at bedtime   glipiZIDE 5 MG 24 hr tablet Commonly known as:  GLUCOTROL XL Take 5 mg by mouth daily with breakfast.   hydrocortisone 2.5 % ointment Apply 1 application topically daily as needed (itchy skin).   hydrOXYzine 25 MG capsule Commonly known as:  VISTARIL Take 25 mg by mouth 2 (two) times daily as needed for anxiety.   levothyroxine 25 MCG tablet Commonly known as:  SYNTHROID, LEVOTHROID Take 25 mcg by mouth daily before breakfast.   linagliptin 5 MG Tabs tablet Commonly known as:  TRADJENTA Take 5 mg by mouth daily.   loperamide 2 MG capsule Commonly known as:  IMODIUM Take 2 mg by mouth every 8 (eight) hours as needed for diarrhea or loose stools.   LORazepam 1 MG tablet Commonly known as:  ATIVAN Take 1 mg by mouth daily as needed for anxiety.   losartan 100 MG tablet Commonly known as:  COZAAR Take 100 mg by mouth daily.   montelukast 10 MG  tablet Commonly known as:  SINGULAIR Take 10 mg by mouth at bedtime.   omeprazole 20 MG capsule Commonly known as:  PRILOSEC Take 20 mg by mouth daily.   prazosin 2 MG capsule Commonly known as:  MINIPRESS Take 2 mg by mouth at bedtime. For PTSD associated nightmares   pyridOXINE 100 MG tablet Commonly known as:  VITAMIN B-6 Take 200 mg by mouth 2 (two) times daily.   risperiDONE 0.5 MG tablet Commonly known as:  RISPERDAL Take 0.5 mg by mouth 2 (two) times daily.   spironolactone 25 MG tablet Commonly known as:  ALDACTONE Take 1 tablet (25 mg total) by mouth daily.   torsemide 10 MG tablet Commonly known as:  DEMADEX Take 40 mg by mouth daily.   traZODone 100 MG  tablet Commonly known as:  DESYREL Take 100 mg by mouth at bedtime as needed for sleep.   Vitamin D 2000 units Caps Take 4,000 Units by mouth daily.      Allergies  Allergen Reactions  . Benztropine Mesylate Other (See Comments)    REACTION: Alopecia and white spots on eyes  . Codeine Other (See Comments)    REACTION: Mouth Swelling - Tachycardia  . Diazepam Other (See Comments)    REACTION: COMA  . Diphenhydramine Hcl Other (See Comments)    REACTION: Tachycardia , anxiousness  . Penicillins Other (See Comments)    REACTION: ulcers in mouth, swelling, throat swelling  Has patient had a PCN reaction causing immediate rash, facial/tongue/throat swelling, SOB or lightheadedness with hypotension: YES Has patient had a PCN reaction causing severe rash involving Mucus membranes or skin necrosis: Unknown Has patient had a PCN reaction that required hospitalization Unknown Has patient had a PCN reaction occurring within the last 10 years: unknown If all of the above answers are "NO", then may proceed with Cephalospori  . Sulfonamide Derivatives Other (See Comments)    REACTION: rash - blisters  . Thioridazine Hcl Other (See Comments)    REACTION: swelling  . Lisinopril Other (See Comments)    REACTION: cough   Follow-up Information    Madelyn Brunner, MD. Schedule an appointment as soon as possible for a visit in 2 week(s).   Specialty:  Neurology Contact information: [address] [place] [address] [phone]            The results of significant diagnostics from this hospitalization (including imaging, microbiology, ancillary and laboratory) are listed below for reference.    Significant Diagnostic Studies: Dg Forearm Left  Result Date: 04/06/2018 CLINICAL DATA:  Fall with forearm pain EXAM: LEFT FOREARM - 2 VIEW COMPARISON:  None. FINDINGS: No fracture or malalignment. Soft tissue swelling dorsal aspect of the distal forearm. No radiopaque foreign body IMPRESSION: No  acute osseous abnormality. Soft tissue swelling dorsal aspect of the distal forearm Electronically Signed   By: Donavan Foil M.D.   On: 04/06/2018 23:41   Ct Head Wo Contrast  Result Date: 04/19/2018 CLINICAL DATA:  Unresponsive this evening. EXAM: CT HEAD WITHOUT CONTRAST TECHNIQUE: Contiguous axial images were obtained from the base of the skull through the vertex without intravenous contrast. COMPARISON:  04/12/2018 FINDINGS: Brain: No evidence of acute infarction, hemorrhage, hydrocephalus, extra-axial collection or mass lesion/mass effect. Vascular: No hyperdense vessel or unexpected calcification. Skull: Normal. Negative for fracture or focal lesion. Sinuses/Orbits: No acute finding. Other: None. IMPRESSION: Negative head CT. Electronically Signed   By: Lucienne Capers M.D.   On: 04/19/2018 00:02   Ct Head Wo Contrast  Result Date: 04/12/2018 CLINICAL DATA:  Hypertension today. EXAM: CT HEAD WITHOUT CONTRAST TECHNIQUE: Contiguous axial images were obtained from the base of the skull through the vertex without intravenous contrast. COMPARISON:  Head CT scan 04/06/2018 and 01/22/2017. FINDINGS: Brain: No evidence of acute infarction, hemorrhage, hydrocephalus, extra-axial collection or mass lesion/mass effect. Vascular: No hyperdense vessel or unexpected calcification. Skull: Intact.  No focal lesion. Sinuses/Orbits: Status post bilateral cataract surgery. Otherwise negative Other: None. IMPRESSION: Negative head CT. Electronically Signed   By: Inge Rise M.D.   On: 04/12/2018 12:19   Ct Head Wo Contrast  Result Date: 04/06/2018 CLINICAL DATA:  [place] in bathroom hit back of head EXAM: CT HEAD WITHOUT CONTRAST CT CERVICAL SPINE WITHOUT CONTRAST TECHNIQUE: Multidetector CT imaging of the head and cervical spine was performed following the standard protocol without intravenous contrast. Multiplanar CT image reconstructions of the cervical spine were also generated. COMPARISON:  CT brain 01/12/2018  FINDINGS: CT HEAD FINDINGS Brain: No acute territorial infarction, hemorrhage or intracranial mass. Mild atrophy. Mild small vessel ischemic changes of the white matter. Chronic lacunar infarcts within the left greater than right basal ganglia. Vascular: No hyperdense vessels.  Carotid vascular calcification. Skull: Normal. Negative for fracture or focal lesion. Sinuses/Orbits: No acute finding. Other: None CT CERVICAL SPINE FINDINGS Alignment: No subluxation.  Facet alignment within normal limits Skull base and vertebrae: No acute fracture. No primary bone lesion or focal pathologic process. Soft tissues and spinal canal: No prevertebral fluid or swelling. No visible canal hematoma. Disc levels:  Mild degenerative changes C4-C5, C5-C6 and C6-C7. Upper chest: Negative. Other: None IMPRESSION: 1. No CT evidence for acute intracranial abnormality. Atrophy and mild small vessel ischemic changes of the white matter 2. No acute osseous abnormality of the cervical spine Electronically Signed   By: Donavan Foil M.D.   On: 04/06/2018 23:50   Ct Cervical Spine Wo Contrast  Result Date: 04/06/2018 CLINICAL DATA:  [place] in bathroom hit back of head EXAM: CT HEAD WITHOUT CONTRAST CT CERVICAL SPINE WITHOUT CONTRAST TECHNIQUE: Multidetector CT imaging of the head and cervical spine was performed following the standard protocol without intravenous contrast. Multiplanar CT image reconstructions of the cervical spine were also generated. COMPARISON:  CT brain 01/12/2018 FINDINGS: CT HEAD FINDINGS Brain: No acute territorial infarction, hemorrhage or intracranial mass. Mild atrophy. Mild small vessel ischemic changes of the white matter. Chronic lacunar infarcts within the left greater than right basal ganglia. Vascular: No hyperdense vessels.  Carotid vascular calcification. Skull: Normal. Negative for fracture or focal lesion. Sinuses/Orbits: No acute finding. Other: None CT CERVICAL SPINE FINDINGS Alignment: No subluxation.   Facet alignment within normal limits Skull base and vertebrae: No acute fracture. No primary bone lesion or focal pathologic process. Soft tissues and spinal canal: No prevertebral fluid or swelling. No visible canal hematoma. Disc levels:  Mild degenerative changes C4-C5, C5-C6 and C6-C7. Upper chest: Negative. Other: None IMPRESSION: 1. No CT evidence for acute intracranial abnormality. Atrophy and mild small vessel ischemic changes of the white matter 2. No acute osseous abnormality of the cervical spine Electronically Signed   By: Donavan Foil M.D.   On: 04/06/2018 23:50   Mr Brain Wo Contrast  Result Date: 04/19/2018 CLINICAL DATA:  67 y/o F; acute encephalopathy and catatonic. Nonverbal. EXAM: MRI HEAD WITHOUT CONTRAST TECHNIQUE: Multiplanar, multiecho pulse sequences of the brain and surrounding structures were obtained without intravenous contrast. COMPARISON:  04/18/2018 CT head. FINDINGS: Brain: No acute infarction, hemorrhage, hydrocephalus, extra-axial collection or mass  lesion. Several stable nonspecific T2 FLAIR hyperintensities in subcortical and periventricular white matter are compatible with mild chronic microvascular ischemic changes for age. Mild volume loss of the brain. 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 Vascular: Normal flow voids. Skull and upper cervical spine: Normal marrow signal. Sinuses/Orbits: Mild paranasal sinus disease with maxillary sinus mucous retention cyst. Bilateral intra-ocular lens replacement. Partial opacification of the mastoid air cells. Other: None. IMPRESSION: 1. No acute intracranial abnormality. 2. Stable mild chronic microvascular ischemic changes and volume loss of the brain. 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 3. Mild paranasal sinus disease. Electronically Signed   By: Kristine Garbe M.D.   On: 04/19/2018 15:41   Dg Chest Portable 1 View  Result Date: 04/18/2018 CLINICAL DATA:  Altered mental  status EXAM: PORTABLE CHEST 1 VIEW COMPARISON:  03/24/2018, 01/12/2018 FINDINGS: Cardiomegaly with mild central congestion. Low lung volumes. No focal consolidation or pleural effusion. No pneumothorax. IMPRESSION: Cardiomegaly with mild central vascular congestion Electronically Signed   By: Donavan Foil M.D.   On: 04/18/2018 23:38   Dg Chest Portable 1 View  Result Date: 03/24/2018 CLINICAL DATA:  Dizziness, hypertension. EXAM: PORTABLE CHEST 1 VIEW COMPARISON:  Radiographs of Jan 12, 2018. FINDINGS: Stable cardiomegaly. No pneumothorax or pleural effusion is noted. No acute pulmonary disease is noted. Bony thorax is unremarkable. IMPRESSION: No acute cardiopulmonary abnormality seen. Electronically Signed   By: Marijo Conception, M.D.   On: 03/24/2018 10:04   Dg Shoulder Left  Result Date: 04/06/2018 CLINICAL DATA:  Fall with shoulder pain EXAM: LEFT SHOULDER - 2+ VIEW COMPARISON:  Chest x-ray 03/24/2018 FINDINGS: [facility] joint shows mild degenerative change. High-riding humeral head consistent with rotator cuff disease. No acute fracture or dislocation. IMPRESSION: 1. No acute osseous abnormality. 2. High-riding humeral head consistent with rotator cuff disease Electronically Signed   By: Donavan Foil M.D.   On: 04/06/2018 23:40    Microbiology: Recent Results (from the past 240 hour(s))  Blood culture (routine x 2)     Status: None (Preliminary result)   Collection Time: 04/18/18 11:30 PM  Result Value Ref Range Status   Specimen Description BLOOD LEFT ARM  Final   Special Requests   Final    BOTTLES DRAWN AEROBIC AND ANAEROBIC Blood Culture adequate volume   Culture   Final    NO GROWTH 3 DAYS Performed at [facility], [address], [place], [place] [address]    Report Status PENDING  Incomplete  Blood culture (routine x 2)     Status: None (Preliminary result)   Collection Time: 04/18/18 11:34 PM  Result Value Ref Range Status   Specimen Description BLOOD RIGHT HAND  Final   Special  Requests   Final    BOTTLES DRAWN AEROBIC AND ANAEROBIC Blood Culture adequate volume   Culture   Final    NO GROWTH 3 DAYS Performed at [facility], [address], [place], [place] [address]    Report Status PENDING  Incomplete  MRSA PCR Screening     Status: None   Collection Time: 04/19/18  4:23 AM  Result Value Ref Range Status   MRSA by PCR NEGATIVE NEGATIVE Final    Comment:        The GeneXpert MRSA Assay (FDA approved for NASAL specimens only), is one component of a comprehensive MRSA colonization surveillance program. It is not intended to diagnose MRSA infection nor to guide or monitor treatment for MRSA infections. Performed at [facility],  [address] [place], [place] [address]      Labs: Basic Metabolic Panel: Recent Labs  Lab 04/18/18 2330 04/21/18 0423  NA 137 140  K 3.8 4.2  CL 105 105  CO2 23 28  GLUCOSE 240* 194*  BUN 42* 42*  CREATININE 2.34* 2.37*  CALCIUM 8.4* 9.0   Liver Function Tests: Recent Labs  Lab 04/18/18 2330  AST 17  ALT 23  ALKPHOS 82  BILITOT 0.6  PROT 6.2*  ALBUMIN 2.9*   No results for input(s): LIPASE, AMYLASE in the last 168 hours. Recent Labs  Lab 04/18/18 2330  AMMONIA 16   CBC: Recent Labs  Lab 04/18/18 2330  WBC 8.8  NEUTROABS 6.3  HGB 9.8*  HCT 28.5*  MCV 96.0  PLT 173   Cardiac Enzymes: Recent Labs  Lab 04/18/18 2330  CKTOTAL 105   BNP: BNP (last 3 results) Recent Labs    01/12/18 1056  BNP 145.3*    ProBNP (last 3 results) No results for input(s): PROBNP in the last 8760 hours.  CBG: Recent Labs  Lab 04/20/18 1157 04/20/18 1623 04/20/18 2246 04/21/18 0801 04/21/18 1134  GLUCAP 222* 187* 201* 171* 277*       Signed:  [facility] Pager: [phone] 04/21/2018, 11:51 AM

## 2018-04-21 NOTE — Progress Notes (Signed)
Removed IV-clean, dry, intact. [place] caregiver came to pick up patient. I helped stable wheel patient and belongings to main entrance where she was picked up by caregiver.

## 2018-04-21 NOTE — Care Management Important Message (Signed)
Important Message  Patient Details  Name: [name] MRN: [id number] Date of Birth: [birth year]   Medicare Important Message Given:  Yes    [name] 04/21/2018, 2:30 PM

## 2018-04-21 NOTE — Clinical Social Work Note (Signed)
Attempted call to niece listed on chart, no answer and voicemail message not set up.   Discharge summary and FL2 sent to facility. Butch Penny at [facility] notified of discharge. Facility to transport patient. LCSW signing off.     Destinae Neubecker, Clydene Pugh, LCSW

## 2018-04-21 NOTE — Care Management Note (Signed)
Case Management Note  Patient Details  Name: [name] MRN: [id number] Date of Birth: 1951/05/01     Expected Discharge Date:  04/21/18               Expected Discharge Plan:  Assisted Living / Rest Home(with HH)  In-House Referral:     Discharge planning Services  CM Consult  Post Acute Care Choice:  Home Health Choice offered to:     DME Arranged:    DME Agency:     HH Arranged:    [place] Agency:  [facility]  Status of Service:  Completed, signed off  If discussed at [place] of Stay Meetings, dates discussed:    Additional Comments:  Patient discharging back to ALF. Patient already active with [facility]. New orders placed and [name] of [facility] notified via VM.   Bonifacio Pruden, Chauncey Reading, RN 04/21/2018, 2:16 PM

## 2018-04-23 DIAGNOSIS — E1122 Type 2 diabetes mellitus with diabetic chronic kidney disease: Secondary | ICD-10-CM | POA: Diagnosis not present

## 2018-04-23 DIAGNOSIS — I130 Hypertensive heart and chronic kidney disease with heart failure and stage 1 through stage 4 chronic kidney disease, or unspecified chronic kidney disease: Secondary | ICD-10-CM | POA: Diagnosis not present

## 2018-04-23 DIAGNOSIS — N183 Chronic kidney disease, stage 3 (moderate): Secondary | ICD-10-CM | POA: Diagnosis not present

## 2018-04-23 DIAGNOSIS — J45901 Unspecified asthma with (acute) exacerbation: Secondary | ICD-10-CM | POA: Diagnosis not present

## 2018-04-23 DIAGNOSIS — Z7984 Long term (current) use of oral hypoglycemic drugs: Secondary | ICD-10-CM | POA: Diagnosis not present

## 2018-04-23 DIAGNOSIS — E1142 Type 2 diabetes mellitus with diabetic polyneuropathy: Secondary | ICD-10-CM | POA: Diagnosis not present

## 2018-04-23 LAB — CULTURE, BLOOD (ROUTINE X 2)
Culture: NO GROWTH
Culture: NO GROWTH
Special Requests: ADEQUATE
Special Requests: ADEQUATE

## 2018-04-27 ENCOUNTER — Other Ambulatory Visit: Payer: Self-pay

## 2018-04-27 ENCOUNTER — Encounter (HOSPITAL_COMMUNITY): Payer: Self-pay | Admitting: Emergency Medicine

## 2018-04-27 ENCOUNTER — Emergency Department (HOSPITAL_COMMUNITY)
Admission: EM | Admit: 2018-04-27 | Discharge: 2018-04-27 | Disposition: A | Discharge status: Home / Self Care | Payer: Medicare Other | Attending: Emergency Medicine | Admitting: Emergency Medicine

## 2018-04-27 ENCOUNTER — Emergency Department (HOSPITAL_COMMUNITY): Payer: Medicare Other

## 2018-04-27 DIAGNOSIS — J449 Chronic obstructive pulmonary disease, unspecified: Secondary | ICD-10-CM | POA: Diagnosis not present

## 2018-04-27 DIAGNOSIS — Z79899 Other long term (current) drug therapy: Secondary | ICD-10-CM | POA: Diagnosis not present

## 2018-04-27 DIAGNOSIS — E1142 Type 2 diabetes mellitus with diabetic polyneuropathy: Secondary | ICD-10-CM | POA: Diagnosis not present

## 2018-04-27 DIAGNOSIS — Z87891 Personal history of nicotine dependence: Secondary | ICD-10-CM | POA: Diagnosis not present

## 2018-04-27 DIAGNOSIS — Z7982 Long term (current) use of aspirin: Secondary | ICD-10-CM | POA: Insufficient documentation

## 2018-04-27 DIAGNOSIS — N183 Chronic kidney disease, stage 3 (moderate): Secondary | ICD-10-CM | POA: Diagnosis not present

## 2018-04-27 DIAGNOSIS — M79671 Pain in right foot: Secondary | ICD-10-CM | POA: Diagnosis not present

## 2018-04-27 DIAGNOSIS — Z7984 Long term (current) use of oral hypoglycemic drugs: Secondary | ICD-10-CM | POA: Diagnosis not present

## 2018-04-27 DIAGNOSIS — L02619 Cutaneous abscess of unspecified foot: Secondary | ICD-10-CM | POA: Insufficient documentation

## 2018-04-27 DIAGNOSIS — I5032 Chronic diastolic (congestive) heart failure: Secondary | ICD-10-CM | POA: Diagnosis not present

## 2018-04-27 DIAGNOSIS — J45901 Unspecified asthma with (acute) exacerbation: Secondary | ICD-10-CM | POA: Diagnosis not present

## 2018-04-27 DIAGNOSIS — E1122 Type 2 diabetes mellitus with diabetic chronic kidney disease: Secondary | ICD-10-CM | POA: Insufficient documentation

## 2018-04-27 DIAGNOSIS — I130 Hypertensive heart and chronic kidney disease with heart failure and stage 1 through stage 4 chronic kidney disease, or unspecified chronic kidney disease: Secondary | ICD-10-CM | POA: Insufficient documentation

## 2018-04-27 DIAGNOSIS — L02611 Cutaneous abscess of right foot: Secondary | ICD-10-CM | POA: Diagnosis not present

## 2018-04-27 LAB — COMPREHENSIVE METABOLIC PANEL
ALT: 26 U/L (ref 0–44)
AST: 16 U/L (ref 15–41)
Albumin: 2.8 g/dL — ABNORMAL LOW (ref 3.5–5.0)
Alkaline Phosphatase: 75 U/L (ref 38–126)
Anion gap: 7 (ref 5–15)
BUN: 45 mg/dL — ABNORMAL HIGH (ref 8–23)
CO2: 25 mmol/L (ref 22–32)
Calcium: 8.3 mg/dL — ABNORMAL LOW (ref 8.9–10.3)
Chloride: 104 mmol/L (ref 98–111)
Creatinine, Ser: 2.42 mg/dL — ABNORMAL HIGH (ref 0.44–1.00)
GFR calc Af Amer: 23 mL/min — ABNORMAL LOW (ref >60)
GFR calc non Af Amer: 20 mL/min — ABNORMAL LOW (ref >60)
Glucose, Bld: 216 mg/dL — ABNORMAL HIGH (ref 70–99)
Potassium: 4.4 mmol/L (ref 3.5–5.1)
Sodium: 136 mmol/L (ref 135–145)
Total Bilirubin: 0.6 mg/dL (ref 0.3–1.2)
Total Protein: 6.3 g/dL — ABNORMAL LOW (ref 6.5–8.1)

## 2018-04-27 LAB — CBC WITH DIFFERENTIAL/PLATELET
Basophils Absolute: 0 10*3/uL (ref 0.0–0.1)
Basophils Relative: 0 %
Eosinophils Absolute: 0.2 10*3/uL (ref 0.0–0.7)
Eosinophils Relative: 3 %
HCT: 24.8 % — ABNORMAL LOW (ref 36.0–46.0)
Hemoglobin: 8.4 g/dL — ABNORMAL LOW (ref 12.0–15.0)
Lymphocytes Relative: 16 %
Lymphs Abs: 1.4 10*3/uL (ref 0.7–4.0)
MCH: 32.9 pg (ref 26.0–34.0)
MCHC: 33.9 g/dL (ref 30.0–36.0)
MCV: 97.3 fL (ref 78.0–100.0)
Monocytes Absolute: 0.9 10*3/uL (ref 0.1–1.0)
Monocytes Relative: 10 %
Neutro Abs: 6.3 10*3/uL (ref 1.7–7.7)
Neutrophils Relative %: 71 %
Platelets: 171 10*3/uL (ref 150–400)
RBC: 2.55 MIL/uL — ABNORMAL LOW (ref 3.87–5.11)
RDW: 13.6 % (ref 11.5–15.5)
WBC: 8.8 10*3/uL (ref 4.0–10.5)

## 2018-04-27 LAB — CBG MONITORING, ED: Glucose-Capillary: 203 mg/dL — ABNORMAL HIGH (ref 70–99)

## 2018-04-27 MED ORDER — POVIDONE-IODINE 10 % EX SOLN
CUTANEOUS | Status: AC
  Administered 2018-04-27: 2
  Filled 2018-04-27: qty 30

## 2018-04-27 MED ORDER — DOXYCYCLINE HYCLATE 100 MG PO CAPS: 100.0000 mg | ORAL_CAPSULE | Freq: Two times a day (BID) | ORAL | 0 refills | Status: DC

## 2018-04-27 MED ORDER — LIDOCAINE HCL (PF) 1 % IJ SOLN
INTRAMUSCULAR | Status: AC
  Filled 2018-04-27: qty 2

## 2018-04-27 MED ORDER — VANCOMYCIN HCL IN DEXTROSE 1-5 GM/200ML-% IV SOLN
1000.0000 mg | Freq: Once | INTRAVENOUS | Status: AC
  Administered 2018-04-27: 1000 mg via INTRAVENOUS
  Filled 2018-04-27: qty 200

## 2018-04-27 MED ORDER — LIDOCAINE-EPINEPHRINE 1 %-1:100000 IJ SOLN
10.0000 mL | Freq: Once | INTRAMUSCULAR | Status: AC
  Administered 2018-04-27: 10 mL
  Filled 2018-04-27: qty 10

## 2018-04-27 NOTE — ED Notes (Signed)
Patient's bed linen changed. Patient voided x 1.

## 2018-04-27 NOTE — ED Provider Notes (Signed)
[facility] Provider Note   CSN: [id number] Arrival date & time: 04/27/18  1620     History   Chief Complaint Chief Complaint  Patient presents with  . Foot Pain    HPI [name] is a 67 y.o. female.  HPI  67 year old female, history of diabetes, history of chronic kidney disease, COPD, hypertension, she also has a history of memory loss and currently lives in a facility at [facility].  She has had intermittent cellulitis over her legs over time, there has been mild edema bilaterally, this is been treated intermittently in the past.  Specifically reviewing the chart from August 20 of 2019, she had bilateral lower extremity mild cellulitis versus stasis dermatitis.  She presents today with a complaint of right foot pain on the plantar aspect of the right foot, she is unclear exactly how long it is the been there but it is been getting worse.  There is no fevers.  She has noted that her right foot is become progressively swollen as well.  Nothing seems to make this better, worse with ambulation, symptoms are persistent and gradually worsening.  Past Medical History:  Diagnosis Date  . Anxiety   . Asthma   . Back pain   . Barrett esophagus   . CHF (congestive heart failure) ([place])   . CKD (chronic kidney disease) stage 3, GFR 30-59 ml/min (HCC)   . COPD (chronic obstructive pulmonary disease) ([place])   . Depression   . Diastolic heart failure ([place])   . Essential hypertension   . Fibromyalgia   . GERD (gastroesophageal reflux disease)   . Gout   . History of pericarditis   . Hyperlipidemia   . Hypothyroid   . Major depressive disorder   . Memory loss   . Nephrolithiasis    2008  . Obesity hypoventilation syndrome (HCC)    CPAP  . Obstructive sleep apnea   . PE (pulmonary embolism)    2010  . Peripheral neuropathy   . PTSD (post-traumatic stress disorder)   . Rheumatoid arthritis ([place])   . Schizophrenia ([place])   . Steroid dependence   . Type 2  diabetes mellitus ([place])   . Vitamin D deficiency     Patient Active Problem List   Diagnosis Date Noted  . Type 2 diabetes mellitus ([place]) 04/19/2018  . Acute kidney injury superimposed on chronic kidney disease ([place]) 01/13/2018  . Altered mental status   . Encephalopathy 10/11/2016  . Chest pain 08/12/2016  . CKD (chronic kidney disease) stage 3, GFR 30-59 ml/min (HCC) 08/12/2016  . History of pulmonary embolus (PE) 08/12/2016  . Obesity hypoventilation syndrome ([place]) 08/12/2016  . RBBB 08/12/2016  . Positive D dimer 08/12/2016  . CVA (cerebral infarction) 11/10/2015  . Physical deconditioning 07/01/2011  . Weakness 07/01/2011  . Diabetic foot ulcer ([place]) 05/19/2011  . Polypharmacy 02/09/2011  . BACK PAIN WITH RADICULOPATHY 05/25/2010  . Type 2 diabetes mellitus with diabetic polyneuropathy, without long-term current use of insulin ([place]) 08/18/2009  . Chronic diastolic heart failure ([place]) 04/25/2009  . Anemia 04/14/2009  . Sleep apnea 12/11/2008  . Diabetic peripheral neuropathy ([place]) 05/04/2007  . HYPERCHOLESTEROLEMIA 10/13/2006  . Gout, unspecified 10/13/2006  . HYPOKALEMIA 10/13/2006  . Schizophrenia ([place]) 10/13/2006  . Depression 10/13/2006  . Essential hypertension 10/13/2006  . Venous (peripheral) insufficiency 10/13/2006  . RHINITIS, ALLERGIC 10/13/2006  . Asthma 10/13/2006  . Reflux esophagitis 10/13/2006  . OSTEOARTHRITIS, MULTI SITES 10/13/2006  . INCONTINENCE, URGE 10/13/2006  Past Surgical History:  Procedure Laterality Date  . APPENDECTOMY    . CHOLECYSTECTOMY    . COLONOSCOPY     benign polyps (12/2000), repeat colonoscopy performed in 2007  . ENDOMETRIAL BIOPSY     10/03 benign columnar mucosa (limited material)  . KIDNEY SURGERY       OB History   None      Home Medications    Prior to Admission medications   Medication Sig Start Date End Date Taking? Authorizing Provider  acetaminophen (TYLENOL) 325 MG tablet Take 325 mg by mouth 2 (two)  times daily.     [provider]  albuterol (PROVENTIL HFA;VENTOLIN HFA) 108 (90 Base) MCG/ACT inhaler Inhale 2 puffs into the lungs every 6 (six) hours as needed for wheezing or shortness of breath.    [provider]  albuterol (PROVENTIL) (2.5 MG/3ML) 0.083% nebulizer solution Take 2.5 mg by nebulization every 6 (six) hours as needed for wheezing or shortness of breath.    [provider]  aspirin 81 MG tablet Take 81 mg by mouth daily.    [provider]  atorvastatin (LIPITOR) 40 MG tablet Take 40 mg by mouth daily.     [provider]  budesonide-formoterol (SYMBICORT) 160-4.5 MCG/ACT inhaler Inhale 2 puffs into the lungs 2 (two) times daily.    [provider]  carvedilol (COREG) 25 MG tablet Take 25 mg by mouth 2 (two) times daily with a meal.    [provider]  Cholecalciferol (VITAMIN D) 2000 units CAPS Take 4,000 Units by mouth daily.     [provider]  docusate sodium (COLACE) 100 MG capsule Take 100 mg by mouth daily.     [provider]  doxycycline (VIBRAMYCIN) 100 MG capsule Take 1 capsule (100 mg total) by mouth 2 (two) times daily. 04/27/18   Noemi Chapel, MD  DULoxetine (CYMBALTA) 60 MG capsule Take 60 mg by mouth daily.    [provider]  EPINEPHrine (EPI-PEN) 0.3 mg/0.3 mL DEVI Inject 0.3 mg into the muscle once.    [provider]  gabapentin (NEURONTIN) 300 MG capsule Take 300-600 mg by mouth See admin instructions. 300mg  twice daily and 600mg  at bedtime 04/29/17   [provider]  glipiZIDE (GLUCOTROL XL) 5 MG 24 hr tablet Take 5 mg by mouth daily with breakfast.     [provider]  hydrocortisone 2.5 % ointment Apply 1 application topically daily as needed (itchy skin).    [provider]  hydrOXYzine (VISTARIL) 25 MG capsule Take 25 mg by mouth 2 (two) times daily as needed for anxiety.     [provider]  levothyroxine (SYNTHROID,  LEVOTHROID) 25 MCG tablet Take 25 mcg by mouth daily before breakfast.    [provider]  linagliptin (TRADJENTA) 5 MG TABS tablet Take 5 mg by mouth daily.    [provider]  loperamide (IMODIUM) 2 MG capsule Take 2 mg by mouth every 8 (eight) hours as needed for diarrhea or loose stools.     [provider]  LORazepam (ATIVAN) 1 MG tablet Take 1 mg by mouth daily as needed for anxiety.     [provider]  losartan (COZAAR) 100 MG tablet Take 100 mg by mouth daily.    [provider]  montelukast (SINGULAIR) 10 MG tablet Take 10 mg by mouth at bedtime.    [provider]  Multiple Vitamin (DAILY VITE PO) Take 1 tablet by mouth daily.  [provider]  omeprazole (PRILOSEC) 20 MG capsule Take 20 mg by mouth daily.     [provider]  prazosin (MINIPRESS) 2 MG capsule Take 2 mg by mouth at bedtime. For PTSD associated nightmares    [provider]  pyridOXINE (VITAMIN B-6) 100 MG tablet Take 200 mg by mouth 2 (two) times daily.    [provider]  risperiDONE (RISPERDAL) 0.5 MG tablet Take 0.5 mg by mouth 2 (two) times daily.     [provider]  spironolactone (ALDACTONE) 25 MG tablet Take 1 tablet (25 mg total) by mouth daily. 04/12/18   Mesner, Corene Cornea, MD  torsemide (DEMADEX) 10 MG tablet Take 40 mg by mouth daily.  04/29/17   [provider]  traZODone (DESYREL) 100 MG tablet Take 100 mg by mouth at bedtime as needed for sleep.     [provider]    Family History Family History  Problem Relation Age of Onset  . Bone cancer Mother   . Hypertension Mother   . Heart disease Mother   . COPD Father   . Heart disease Father   . Stroke Father     Social History Social History   Tobacco Use  . Smoking status: Former Smoker    Types: Cigarettes    Last attempt to quit: 06/23/1972    Years since quitting: 45.8  . Smokeless tobacco: Never Used  Substance Use Topics  .  Alcohol use: No  . Drug use: No     Allergies   Benztropine mesylate; Codeine; Diazepam; Diphenhydramine hcl; Penicillins; Sulfonamide derivatives; Thioridazine hcl; and Lisinopril   Review of Systems Review of Systems  All other systems reviewed and are negative.    Physical Exam Updated Vital Signs BP (!) 156/50 (BP Location: Left Arm)   Pulse 73   Temp 98.2 F (36.8 C) (Oral)   Resp 18   Ht 1.524 m (5')   Wt 109.3 kg   SpO2 97%   BMI 47.07 kg/m   Physical Exam  Constitutional: She appears well-developed and well-nourished. No distress.  HENT:  Head: Normocephalic and atraumatic.  Mouth/Throat: Oropharynx is clear and moist. No oropharyngeal exudate.  Eyes: Pupils are equal, round, and reactive to light. Conjunctivae and EOM are normal. Right eye exhibits no discharge. Left eye exhibits no discharge. No scleral icterus.  Neck: Normal range of motion. Neck supple. No JVD present. No thyromegaly present.  Cardiovascular: Normal rate, regular rhythm, normal heart sounds and intact distal pulses. Exam reveals no gallop and no friction rub.  No murmur heard. Bilateral pedal pulses are palpated  Pulmonary/Chest: Effort normal and breath sounds normal. No respiratory distress. She has no wheezes. She has no rales.  Abdominal: Soft. Bowel sounds are normal. She exhibits no distension and no mass. There is no tenderness.  Musculoskeletal: Normal range of motion. She exhibits no edema or tenderness.  Tenderness and vesicular pustular lesion to the plantar aspect of the right foot over the ball of the foot.  There is mild swelling of the entire right foot and swelling on the dorsum of the foot with a slight increased erythematous hue.  Lymphadenopathy:    She has no cervical adenopathy.  Neurological: She is alert. Coordination normal.  Skin: Skin is warm and dry. No rash noted. There is erythema.  Psychiatric: She has a normal mood and affect. Her behavior is normal.  Nursing  note and vitals reviewed.     ED Treatments / Results  Labs (all  labs ordered are listed, but only abnormal results are displayed) Labs Reviewed  CBC WITH DIFFERENTIAL/PLATELET - Abnormal; Notable for the following components:      Result Value   RBC 2.55 (*)    Hemoglobin 8.4 (*)    HCT 24.8 (*)    All other components within normal limits  COMPREHENSIVE METABOLIC PANEL - Abnormal; Notable for the following components:   Glucose, Bld 216 (*)    BUN 45 (*)    Creatinine, Ser 2.42 (*)    Calcium 8.3 (*)    Total Protein 6.3 (*)    Albumin 2.8 (*)    GFR calc non Af Amer 20 (*)    GFR calc Af Amer 23 (*)    All other components within normal limits  CBG MONITORING, ED - Abnormal; Notable for the following components:   Glucose-Capillary 203 (*)    All other components within normal limits    EKG None  Radiology Dg Foot Complete Right  Result Date: 04/27/2018 CLINICAL DATA:  Pain to arch of foot, plantar side for 1 week. No known injury. Diabetes. Sore at the head of the first metatarsal bone. History of gout rheumatoid arthritis. EXAM: RIGHT FOOT COMPLETE - 3+ VIEW COMPARISON:  Plain film of the RIGHT foot dated 09/30/2010. FINDINGS: Fixation wire at the head of the first metatarsal bone, stable in position. Surrounding osseous structures of the great toe appear intact, stable in alignment and without acute or suspicious osseous lesion. Remainder of the osseous structures are unremarkable without acute or suspicious osseous finding. No fracture line or displaced fracture fragment. No destructive change to suggest osteomyelitis. Stable mild degenerative spurring noted within the midfoot and at the base of the posterior calcaneus. Soft tissues about the RIGHT foot are unremarkable. IMPRESSION: Stable appearance of the RIGHT foot.  No acute findings. Electronically Signed   By: Franki Cabot M.D.   On: 04/27/2018 19:19    Procedures .[name] KitchenIncision and Drainage Date/Time: 04/27/2018 9:40  PM Performed by: Noemi Chapel, MD Authorized by: Noemi Chapel, MD   Consent:    Consent obtained:  Verbal   Consent given by:  Patient   Risks discussed:  Bleeding, damage to other organs, incomplete drainage, infection and pain   Alternatives discussed:  Delayed treatment Location:    Type:  Abscess   Size:  4 cm   Location:  Lower extremity   Lower extremity location:  Foot   Foot location:  R foot Pre-procedure details:    Skin preparation:  Betadine Anesthesia (see MAR for exact dosages):    Anesthesia method:  None Procedure type:    Complexity:  Complex Procedure details:    Needle aspiration: no     Incision types:  Single straight   Incision depth:  Submucosal   Scalpel blade:  11   Wound management:  Probed and deloculated, irrigated with saline and extensive cleaning   Drainage:  Purulent   Drainage amount:  Moderate   Wound treatment:  Wound left open   Packing materials:  1/4 in gauze Post-procedure details:    Patient tolerance of procedure:  Tolerated well, no immediate complications Comments:     The patient tolerated this without any anesthesia, she did not feel the incision or the irrigation or the deloculation.   (including critical care time)  Medications Ordered in ED Medications  lidocaine (PF) (XYLOCAINE) 1 % injection (has no administration in time range)  vancomycin (VANCOCIN) IVPB 1000 mg/200 mL premix ( Intravenous Stopped 04/27/18 2052)  lidocaine-EPINEPHrine (XYLOCAINE W/EPI) 1 %-1:100000 (with pres) injection 10 mL (10 mLs Other Given by Other 04/27/18 2136)  povidone-iodine (BETADINE) 10 % external solution (2 application  Given 03/24/97 2136)     Initial Impression / Assessment and Plan / ED Course  I have reviewed the triage vital signs and the nursing notes.  Pertinent labs & imaging results that were available during my care of the patient were reviewed by me and considered in my medical decision making (see chart for details).      Suspect that the patient is a diabetic foot wound, will need an x-ray to rule out a foreign body, antibiotics, this will need to be incised and drained.  Labs evaluated, hemoglobin is close to baseline at 8.4, copper has a metabolic panel shows creatinine is also close to baseline at 2.4.  The patient will undergo incision and drainage, antibiotic's have been started, she will be discharged home on doxycycline.  Vancomycin given, home on doxycycline, small amount of packing placed  Final Clinical Impressions(s) / ED Diagnoses   Final diagnoses:  Abscess of foot    ED Discharge Orders         Ordered    doxycycline (VIBRAMYCIN) 100 MG capsule  2 times daily     04/27/18 2141           Noemi Chapel, MD 04/27/18 2144

## 2018-04-27 NOTE — Discharge Instructions (Addendum)
Your x-ray shows no signs of bone infection, but it does appear that you have a skin infection which we have had to cut open.  Please take doxycycline twice a day for the next 10 days,  As a diabetic you will have difficulty with healing, please make sure that your doctor follows up with you within the next 2 days and make sure that you have good follow-up in the office for frequent wound checks, you may need to go to a wound care center especially if this is a poorly healing wound.  Please return to the emergency department for severe or worsening swelling pain fever or redness

## 2018-04-27 NOTE — ED Notes (Signed)
Patient picked up by [facility] staff. Discharged instructions given to staff and reviewed with patient.

## 2018-04-27 NOTE — ED Triage Notes (Signed)
PT from [facility] came to ED today c/o redness, swelling and pain to bottom of right foot with large foot lesion noted with no drainage.

## 2018-04-28 DIAGNOSIS — E1165 Type 2 diabetes mellitus with hyperglycemia: Secondary | ICD-10-CM | POA: Diagnosis not present

## 2018-04-28 DIAGNOSIS — L97519 Non-pressure chronic ulcer of other part of right foot with unspecified severity: Secondary | ICD-10-CM | POA: Diagnosis not present

## 2018-04-28 DIAGNOSIS — Z6839 Body mass index (BMI) 39.0-39.9, adult: Secondary | ICD-10-CM | POA: Diagnosis not present

## 2018-04-28 DIAGNOSIS — I10 Essential (primary) hypertension: Secondary | ICD-10-CM | POA: Diagnosis not present

## 2018-04-28 DIAGNOSIS — Z299 Encounter for prophylactic measures, unspecified: Secondary | ICD-10-CM | POA: Diagnosis not present

## 2018-04-28 DIAGNOSIS — G4730 Sleep apnea, unspecified: Secondary | ICD-10-CM | POA: Diagnosis not present

## 2018-04-29 DIAGNOSIS — J45901 Unspecified asthma with (acute) exacerbation: Secondary | ICD-10-CM | POA: Diagnosis not present

## 2018-04-29 DIAGNOSIS — I130 Hypertensive heart and chronic kidney disease with heart failure and stage 1 through stage 4 chronic kidney disease, or unspecified chronic kidney disease: Secondary | ICD-10-CM | POA: Diagnosis not present

## 2018-04-29 DIAGNOSIS — N183 Chronic kidney disease, stage 3 (moderate): Secondary | ICD-10-CM | POA: Diagnosis not present

## 2018-04-29 DIAGNOSIS — Z7984 Long term (current) use of oral hypoglycemic drugs: Secondary | ICD-10-CM | POA: Diagnosis not present

## 2018-04-29 DIAGNOSIS — E1122 Type 2 diabetes mellitus with diabetic chronic kidney disease: Secondary | ICD-10-CM | POA: Diagnosis not present

## 2018-04-29 DIAGNOSIS — E1142 Type 2 diabetes mellitus with diabetic polyneuropathy: Secondary | ICD-10-CM | POA: Diagnosis not present

## 2018-04-30 DIAGNOSIS — N183 Chronic kidney disease, stage 3 (moderate): Secondary | ICD-10-CM | POA: Diagnosis not present

## 2018-04-30 DIAGNOSIS — J45901 Unspecified asthma with (acute) exacerbation: Secondary | ICD-10-CM | POA: Diagnosis not present

## 2018-04-30 DIAGNOSIS — I130 Hypertensive heart and chronic kidney disease with heart failure and stage 1 through stage 4 chronic kidney disease, or unspecified chronic kidney disease: Secondary | ICD-10-CM | POA: Diagnosis not present

## 2018-04-30 DIAGNOSIS — Z7984 Long term (current) use of oral hypoglycemic drugs: Secondary | ICD-10-CM | POA: Diagnosis not present

## 2018-04-30 DIAGNOSIS — E1142 Type 2 diabetes mellitus with diabetic polyneuropathy: Secondary | ICD-10-CM | POA: Diagnosis not present

## 2018-04-30 DIAGNOSIS — E1122 Type 2 diabetes mellitus with diabetic chronic kidney disease: Secondary | ICD-10-CM | POA: Diagnosis not present

## 2018-05-01 DIAGNOSIS — N183 Chronic kidney disease, stage 3 (moderate): Secondary | ICD-10-CM | POA: Diagnosis not present

## 2018-05-01 DIAGNOSIS — I130 Hypertensive heart and chronic kidney disease with heart failure and stage 1 through stage 4 chronic kidney disease, or unspecified chronic kidney disease: Secondary | ICD-10-CM | POA: Diagnosis not present

## 2018-05-01 DIAGNOSIS — J45901 Unspecified asthma with (acute) exacerbation: Secondary | ICD-10-CM | POA: Diagnosis not present

## 2018-05-01 DIAGNOSIS — Z7984 Long term (current) use of oral hypoglycemic drugs: Secondary | ICD-10-CM | POA: Diagnosis not present

## 2018-05-01 DIAGNOSIS — E1142 Type 2 diabetes mellitus with diabetic polyneuropathy: Secondary | ICD-10-CM | POA: Diagnosis not present

## 2018-05-01 DIAGNOSIS — E1122 Type 2 diabetes mellitus with diabetic chronic kidney disease: Secondary | ICD-10-CM | POA: Diagnosis not present

## 2018-05-02 DIAGNOSIS — N183 Chronic kidney disease, stage 3 (moderate): Secondary | ICD-10-CM | POA: Diagnosis not present

## 2018-05-02 DIAGNOSIS — Z7984 Long term (current) use of oral hypoglycemic drugs: Secondary | ICD-10-CM | POA: Diagnosis not present

## 2018-05-02 DIAGNOSIS — E1122 Type 2 diabetes mellitus with diabetic chronic kidney disease: Secondary | ICD-10-CM | POA: Diagnosis not present

## 2018-05-02 DIAGNOSIS — I130 Hypertensive heart and chronic kidney disease with heart failure and stage 1 through stage 4 chronic kidney disease, or unspecified chronic kidney disease: Secondary | ICD-10-CM | POA: Diagnosis not present

## 2018-05-02 DIAGNOSIS — E1142 Type 2 diabetes mellitus with diabetic polyneuropathy: Secondary | ICD-10-CM | POA: Diagnosis not present

## 2018-05-02 DIAGNOSIS — J45901 Unspecified asthma with (acute) exacerbation: Secondary | ICD-10-CM | POA: Diagnosis not present

## 2018-05-03 DIAGNOSIS — J45901 Unspecified asthma with (acute) exacerbation: Secondary | ICD-10-CM | POA: Diagnosis not present

## 2018-05-03 DIAGNOSIS — N183 Chronic kidney disease, stage 3 (moderate): Secondary | ICD-10-CM | POA: Diagnosis not present

## 2018-05-03 DIAGNOSIS — E1142 Type 2 diabetes mellitus with diabetic polyneuropathy: Secondary | ICD-10-CM | POA: Diagnosis not present

## 2018-05-03 DIAGNOSIS — E1122 Type 2 diabetes mellitus with diabetic chronic kidney disease: Secondary | ICD-10-CM | POA: Diagnosis not present

## 2018-05-03 DIAGNOSIS — I130 Hypertensive heart and chronic kidney disease with heart failure and stage 1 through stage 4 chronic kidney disease, or unspecified chronic kidney disease: Secondary | ICD-10-CM | POA: Diagnosis not present

## 2018-05-03 DIAGNOSIS — Z7984 Long term (current) use of oral hypoglycemic drugs: Secondary | ICD-10-CM | POA: Diagnosis not present

## 2018-05-04 DIAGNOSIS — N183 Chronic kidney disease, stage 3 (moderate): Secondary | ICD-10-CM | POA: Diagnosis not present

## 2018-05-04 DIAGNOSIS — I130 Hypertensive heart and chronic kidney disease with heart failure and stage 1 through stage 4 chronic kidney disease, or unspecified chronic kidney disease: Secondary | ICD-10-CM | POA: Diagnosis not present

## 2018-05-04 DIAGNOSIS — Z7984 Long term (current) use of oral hypoglycemic drugs: Secondary | ICD-10-CM | POA: Diagnosis not present

## 2018-05-04 DIAGNOSIS — E1142 Type 2 diabetes mellitus with diabetic polyneuropathy: Secondary | ICD-10-CM | POA: Diagnosis not present

## 2018-05-04 DIAGNOSIS — J45901 Unspecified asthma with (acute) exacerbation: Secondary | ICD-10-CM | POA: Diagnosis not present

## 2018-05-04 DIAGNOSIS — E1122 Type 2 diabetes mellitus with diabetic chronic kidney disease: Secondary | ICD-10-CM | POA: Diagnosis not present

## 2018-05-05 DIAGNOSIS — Z299 Encounter for prophylactic measures, unspecified: Secondary | ICD-10-CM | POA: Diagnosis not present

## 2018-05-05 DIAGNOSIS — Z23 Encounter for immunization: Secondary | ICD-10-CM | POA: Diagnosis not present

## 2018-05-05 DIAGNOSIS — L97519 Non-pressure chronic ulcer of other part of right foot with unspecified severity: Secondary | ICD-10-CM | POA: Diagnosis not present

## 2018-05-05 DIAGNOSIS — E11621 Type 2 diabetes mellitus with foot ulcer: Secondary | ICD-10-CM | POA: Diagnosis not present

## 2018-05-05 DIAGNOSIS — I10 Essential (primary) hypertension: Secondary | ICD-10-CM | POA: Diagnosis not present

## 2018-05-05 DIAGNOSIS — Z6839 Body mass index (BMI) 39.0-39.9, adult: Secondary | ICD-10-CM | POA: Diagnosis not present

## 2018-05-05 DIAGNOSIS — E1165 Type 2 diabetes mellitus with hyperglycemia: Secondary | ICD-10-CM | POA: Diagnosis not present

## 2018-05-09 DIAGNOSIS — N183 Chronic kidney disease, stage 3 (moderate): Secondary | ICD-10-CM | POA: Diagnosis not present

## 2018-05-09 DIAGNOSIS — Z7984 Long term (current) use of oral hypoglycemic drugs: Secondary | ICD-10-CM | POA: Diagnosis not present

## 2018-05-09 DIAGNOSIS — E1122 Type 2 diabetes mellitus with diabetic chronic kidney disease: Secondary | ICD-10-CM | POA: Diagnosis not present

## 2018-05-09 DIAGNOSIS — I130 Hypertensive heart and chronic kidney disease with heart failure and stage 1 through stage 4 chronic kidney disease, or unspecified chronic kidney disease: Secondary | ICD-10-CM | POA: Diagnosis not present

## 2018-05-09 DIAGNOSIS — J45901 Unspecified asthma with (acute) exacerbation: Secondary | ICD-10-CM | POA: Diagnosis not present

## 2018-05-09 DIAGNOSIS — E1142 Type 2 diabetes mellitus with diabetic polyneuropathy: Secondary | ICD-10-CM | POA: Diagnosis not present

## 2018-05-11 DIAGNOSIS — I130 Hypertensive heart and chronic kidney disease with heart failure and stage 1 through stage 4 chronic kidney disease, or unspecified chronic kidney disease: Secondary | ICD-10-CM | POA: Diagnosis not present

## 2018-05-11 DIAGNOSIS — N183 Chronic kidney disease, stage 3 (moderate): Secondary | ICD-10-CM | POA: Diagnosis not present

## 2018-05-11 DIAGNOSIS — E1142 Type 2 diabetes mellitus with diabetic polyneuropathy: Secondary | ICD-10-CM | POA: Diagnosis not present

## 2018-05-11 DIAGNOSIS — E1122 Type 2 diabetes mellitus with diabetic chronic kidney disease: Secondary | ICD-10-CM | POA: Diagnosis not present

## 2018-05-11 DIAGNOSIS — Z7984 Long term (current) use of oral hypoglycemic drugs: Secondary | ICD-10-CM | POA: Diagnosis not present

## 2018-05-11 DIAGNOSIS — J45901 Unspecified asthma with (acute) exacerbation: Secondary | ICD-10-CM | POA: Diagnosis not present

## 2018-05-16 DIAGNOSIS — E1142 Type 2 diabetes mellitus with diabetic polyneuropathy: Secondary | ICD-10-CM | POA: Diagnosis not present

## 2018-05-16 DIAGNOSIS — N183 Chronic kidney disease, stage 3 (moderate): Secondary | ICD-10-CM | POA: Diagnosis not present

## 2018-05-16 DIAGNOSIS — Z7984 Long term (current) use of oral hypoglycemic drugs: Secondary | ICD-10-CM | POA: Diagnosis not present

## 2018-05-16 DIAGNOSIS — J45901 Unspecified asthma with (acute) exacerbation: Secondary | ICD-10-CM | POA: Diagnosis not present

## 2018-05-16 DIAGNOSIS — I130 Hypertensive heart and chronic kidney disease with heart failure and stage 1 through stage 4 chronic kidney disease, or unspecified chronic kidney disease: Secondary | ICD-10-CM | POA: Diagnosis not present

## 2018-05-16 DIAGNOSIS — E1122 Type 2 diabetes mellitus with diabetic chronic kidney disease: Secondary | ICD-10-CM | POA: Diagnosis not present

## 2018-05-18 DIAGNOSIS — E559 Vitamin D deficiency, unspecified: Secondary | ICD-10-CM | POA: Diagnosis not present

## 2018-05-18 DIAGNOSIS — Z79899 Other long term (current) drug therapy: Secondary | ICD-10-CM | POA: Diagnosis not present

## 2018-05-18 DIAGNOSIS — E1122 Type 2 diabetes mellitus with diabetic chronic kidney disease: Secondary | ICD-10-CM | POA: Diagnosis not present

## 2018-05-18 DIAGNOSIS — I130 Hypertensive heart and chronic kidney disease with heart failure and stage 1 through stage 4 chronic kidney disease, or unspecified chronic kidney disease: Secondary | ICD-10-CM | POA: Diagnosis not present

## 2018-05-18 DIAGNOSIS — D509 Iron deficiency anemia, unspecified: Secondary | ICD-10-CM | POA: Diagnosis not present

## 2018-05-18 DIAGNOSIS — J45901 Unspecified asthma with (acute) exacerbation: Secondary | ICD-10-CM | POA: Diagnosis not present

## 2018-05-18 DIAGNOSIS — Z7984 Long term (current) use of oral hypoglycemic drugs: Secondary | ICD-10-CM | POA: Diagnosis not present

## 2018-05-18 DIAGNOSIS — I10 Essential (primary) hypertension: Secondary | ICD-10-CM | POA: Diagnosis not present

## 2018-05-18 DIAGNOSIS — R809 Proteinuria, unspecified: Secondary | ICD-10-CM | POA: Diagnosis not present

## 2018-05-18 DIAGNOSIS — N183 Chronic kidney disease, stage 3 (moderate): Secondary | ICD-10-CM | POA: Diagnosis not present

## 2018-05-18 DIAGNOSIS — E1142 Type 2 diabetes mellitus with diabetic polyneuropathy: Secondary | ICD-10-CM | POA: Diagnosis not present

## 2018-05-23 DIAGNOSIS — E1142 Type 2 diabetes mellitus with diabetic polyneuropathy: Secondary | ICD-10-CM | POA: Diagnosis not present

## 2018-05-23 DIAGNOSIS — Z7984 Long term (current) use of oral hypoglycemic drugs: Secondary | ICD-10-CM | POA: Diagnosis not present

## 2018-05-23 DIAGNOSIS — J45901 Unspecified asthma with (acute) exacerbation: Secondary | ICD-10-CM | POA: Diagnosis not present

## 2018-05-23 DIAGNOSIS — N183 Chronic kidney disease, stage 3 (moderate): Secondary | ICD-10-CM | POA: Diagnosis not present

## 2018-05-23 DIAGNOSIS — E1122 Type 2 diabetes mellitus with diabetic chronic kidney disease: Secondary | ICD-10-CM | POA: Diagnosis not present

## 2018-05-23 DIAGNOSIS — I130 Hypertensive heart and chronic kidney disease with heart failure and stage 1 through stage 4 chronic kidney disease, or unspecified chronic kidney disease: Secondary | ICD-10-CM | POA: Diagnosis not present

## 2018-05-24 DIAGNOSIS — I10 Essential (primary) hypertension: Secondary | ICD-10-CM | POA: Diagnosis not present

## 2018-05-24 DIAGNOSIS — Z6841 Body Mass Index (BMI) 40.0 and over, adult: Secondary | ICD-10-CM | POA: Diagnosis not present

## 2018-05-24 DIAGNOSIS — J45901 Unspecified asthma with (acute) exacerbation: Secondary | ICD-10-CM | POA: Diagnosis not present

## 2018-05-24 DIAGNOSIS — I130 Hypertensive heart and chronic kidney disease with heart failure and stage 1 through stage 4 chronic kidney disease, or unspecified chronic kidney disease: Secondary | ICD-10-CM | POA: Diagnosis not present

## 2018-05-24 DIAGNOSIS — N183 Chronic kidney disease, stage 3 (moderate): Secondary | ICD-10-CM | POA: Diagnosis not present

## 2018-05-24 DIAGNOSIS — Z299 Encounter for prophylactic measures, unspecified: Secondary | ICD-10-CM | POA: Diagnosis not present

## 2018-05-24 DIAGNOSIS — E1122 Type 2 diabetes mellitus with diabetic chronic kidney disease: Secondary | ICD-10-CM | POA: Diagnosis not present

## 2018-05-24 DIAGNOSIS — Z7984 Long term (current) use of oral hypoglycemic drugs: Secondary | ICD-10-CM | POA: Diagnosis not present

## 2018-05-24 DIAGNOSIS — R350 Frequency of micturition: Secondary | ICD-10-CM | POA: Diagnosis not present

## 2018-05-24 DIAGNOSIS — E1142 Type 2 diabetes mellitus with diabetic polyneuropathy: Secondary | ICD-10-CM | POA: Diagnosis not present

## 2018-05-24 DIAGNOSIS — E1165 Type 2 diabetes mellitus with hyperglycemia: Secondary | ICD-10-CM | POA: Diagnosis not present

## 2018-05-24 DIAGNOSIS — N390 Urinary tract infection, site not specified: Secondary | ICD-10-CM | POA: Diagnosis not present

## 2018-05-25 DIAGNOSIS — N183 Chronic kidney disease, stage 3 (moderate): Secondary | ICD-10-CM | POA: Diagnosis not present

## 2018-05-25 DIAGNOSIS — J45901 Unspecified asthma with (acute) exacerbation: Secondary | ICD-10-CM | POA: Diagnosis not present

## 2018-05-25 DIAGNOSIS — I130 Hypertensive heart and chronic kidney disease with heart failure and stage 1 through stage 4 chronic kidney disease, or unspecified chronic kidney disease: Secondary | ICD-10-CM | POA: Diagnosis not present

## 2018-05-25 DIAGNOSIS — Z7984 Long term (current) use of oral hypoglycemic drugs: Secondary | ICD-10-CM | POA: Diagnosis not present

## 2018-05-25 DIAGNOSIS — E1122 Type 2 diabetes mellitus with diabetic chronic kidney disease: Secondary | ICD-10-CM | POA: Diagnosis not present

## 2018-05-25 DIAGNOSIS — E1142 Type 2 diabetes mellitus with diabetic polyneuropathy: Secondary | ICD-10-CM | POA: Diagnosis not present

## 2018-05-26 DIAGNOSIS — R300 Dysuria: Secondary | ICD-10-CM | POA: Diagnosis not present

## 2018-05-30 DIAGNOSIS — I130 Hypertensive heart and chronic kidney disease with heart failure and stage 1 through stage 4 chronic kidney disease, or unspecified chronic kidney disease: Secondary | ICD-10-CM | POA: Diagnosis not present

## 2018-05-30 DIAGNOSIS — Z7984 Long term (current) use of oral hypoglycemic drugs: Secondary | ICD-10-CM | POA: Diagnosis not present

## 2018-05-30 DIAGNOSIS — E1142 Type 2 diabetes mellitus with diabetic polyneuropathy: Secondary | ICD-10-CM | POA: Diagnosis not present

## 2018-05-30 DIAGNOSIS — E1122 Type 2 diabetes mellitus with diabetic chronic kidney disease: Secondary | ICD-10-CM | POA: Diagnosis not present

## 2018-05-30 DIAGNOSIS — J45901 Unspecified asthma with (acute) exacerbation: Secondary | ICD-10-CM | POA: Diagnosis not present

## 2018-05-30 DIAGNOSIS — N183 Chronic kidney disease, stage 3 (moderate): Secondary | ICD-10-CM | POA: Diagnosis not present

## 2018-06-01 ENCOUNTER — Emergency Department (HOSPITAL_COMMUNITY)
Admission: EM | Admit: 2018-06-01 | Discharge: 2018-06-01 | Disposition: A | Discharge status: Skilled Nursing Facility | Payer: Medicare Other | Attending: Emergency Medicine | Admitting: Emergency Medicine

## 2018-06-01 ENCOUNTER — Emergency Department (HOSPITAL_COMMUNITY): Payer: Medicare Other

## 2018-06-01 ENCOUNTER — Other Ambulatory Visit: Payer: Self-pay

## 2018-06-01 ENCOUNTER — Encounter (HOSPITAL_COMMUNITY): Payer: Self-pay | Admitting: Emergency Medicine

## 2018-06-01 DIAGNOSIS — Z87891 Personal history of nicotine dependence: Secondary | ICD-10-CM | POA: Insufficient documentation

## 2018-06-01 DIAGNOSIS — N183 Chronic kidney disease, stage 3 (moderate): Secondary | ICD-10-CM | POA: Diagnosis not present

## 2018-06-01 DIAGNOSIS — E1122 Type 2 diabetes mellitus with diabetic chronic kidney disease: Secondary | ICD-10-CM | POA: Diagnosis not present

## 2018-06-01 DIAGNOSIS — I5032 Chronic diastolic (congestive) heart failure: Secondary | ICD-10-CM | POA: Diagnosis not present

## 2018-06-01 DIAGNOSIS — N3000 Acute cystitis without hematuria: Secondary | ICD-10-CM | POA: Diagnosis not present

## 2018-06-01 DIAGNOSIS — E039 Hypothyroidism, unspecified: Secondary | ICD-10-CM | POA: Insufficient documentation

## 2018-06-01 DIAGNOSIS — I130 Hypertensive heart and chronic kidney disease with heart failure and stage 1 through stage 4 chronic kidney disease, or unspecified chronic kidney disease: Secondary | ICD-10-CM | POA: Diagnosis not present

## 2018-06-01 DIAGNOSIS — Z7984 Long term (current) use of oral hypoglycemic drugs: Secondary | ICD-10-CM | POA: Diagnosis not present

## 2018-06-01 DIAGNOSIS — E1142 Type 2 diabetes mellitus with diabetic polyneuropathy: Secondary | ICD-10-CM | POA: Diagnosis not present

## 2018-06-01 DIAGNOSIS — E1140 Type 2 diabetes mellitus with diabetic neuropathy, unspecified: Secondary | ICD-10-CM | POA: Insufficient documentation

## 2018-06-01 DIAGNOSIS — J45901 Unspecified asthma with (acute) exacerbation: Secondary | ICD-10-CM | POA: Diagnosis not present

## 2018-06-01 DIAGNOSIS — R300 Dysuria: Secondary | ICD-10-CM | POA: Diagnosis not present

## 2018-06-01 LAB — COMPREHENSIVE METABOLIC PANEL
ALT: 23 U/L (ref 0–44)
AST: 17 U/L (ref 15–41)
Albumin: 2.9 g/dL — ABNORMAL LOW (ref 3.5–5.0)
Alkaline Phosphatase: 76 U/L (ref 38–126)
Anion gap: 8 (ref 5–15)
BUN: 47 mg/dL — ABNORMAL HIGH (ref 8–23)
CO2: 22 mmol/L (ref 22–32)
Calcium: 8.6 mg/dL — ABNORMAL LOW (ref 8.9–10.3)
Chloride: 106 mmol/L (ref 98–111)
Creatinine, Ser: 2.72 mg/dL — ABNORMAL HIGH (ref 0.44–1.00)
GFR calc Af Amer: 20 mL/min — ABNORMAL LOW (ref >60)
GFR calc non Af Amer: 17 mL/min — ABNORMAL LOW (ref >60)
Glucose, Bld: 248 mg/dL — ABNORMAL HIGH (ref 70–99)
Potassium: 4 mmol/L (ref 3.5–5.1)
Sodium: 136 mmol/L (ref 135–145)
Total Bilirubin: 0.4 mg/dL (ref 0.3–1.2)
Total Protein: 6.7 g/dL (ref 6.5–8.1)

## 2018-06-01 LAB — URINALYSIS, ROUTINE W REFLEX MICROSCOPIC
Bilirubin Urine: NEGATIVE
Glucose, UA: 150 mg/dL — AB
Hgb urine dipstick: NEGATIVE
Ketones, ur: NEGATIVE mg/dL
Nitrite: NEGATIVE
Protein, ur: >=300 mg/dL — AB
Specific Gravity, Urine: 1.011 (ref 1.005–1.030)
WBC, UA: >50 WBC/hpf — ABNORMAL HIGH (ref 0–5)
pH: 5 (ref 5.0–8.0)

## 2018-06-01 LAB — CBC
HCT: 28.5 % — ABNORMAL LOW (ref 36.0–46.0)
Hemoglobin: 9.2 g/dL — ABNORMAL LOW (ref 12.0–15.0)
MCH: 32.1 pg (ref 26.0–34.0)
MCHC: 32.3 g/dL (ref 30.0–36.0)
MCV: 99.3 fL (ref 80.0–100.0)
Platelets: 193 10*3/uL (ref 150–400)
RBC: 2.87 MIL/uL — ABNORMAL LOW (ref 3.87–5.11)
RDW: 13.4 % (ref 11.5–15.5)
WBC: 7.7 10*3/uL (ref 4.0–10.5)
nRBC: 0 % (ref 0.0–0.2)

## 2018-06-01 LAB — LIPASE, BLOOD: Lipase: 26 U/L (ref 11–51)

## 2018-06-01 MED ORDER — FOSFOMYCIN TROMETHAMINE 3 G PO PACK
3.0000 g | PACK | Freq: Once | ORAL | Status: AC
  Administered 2018-06-01: 3 g via ORAL
  Filled 2018-06-01 (×2): qty 3

## 2018-06-01 MED ORDER — FOSFOMYCIN TROMETHAMINE 3 G PO PACK: 3.0000 g | PACK | Freq: Once | ORAL | 0 refills | Status: AC

## 2018-06-01 NOTE — ED Triage Notes (Signed)
Pt c/o of dysuria, odor with urine, and lower abdominal cramping x 10 days.

## 2018-06-01 NOTE — ED Provider Notes (Signed)
Emergency Department Provider Note   I have reviewed the triage vital signs and the nursing notes.   HISTORY  Chief Complaint Dysuria   HPI [name] is a 67 y.o. female with PMH of CKD, COPD, dCHF, HTN, GERD, HLD, and DM presents to the emergency department with dysuria, foul-smelling urine, lower abdominal pain.  Patient's symptoms have been ongoing for the past 10 days.  She was started on Cipro after outpatient evaluation and diagnosis of UTI.  Patient was referred back to the emergency department when urine cultures resulted showing E. Coli resistant to multiple oral medications including Cipro.  The patient does have a history of severe penicillin allergy.  No documented encounters where she took cephalosporins.  Patient denies any fevers or back/flank pain.  She has not had any cough, chest pain, shortness of breath. No radiation of symptoms or modifying factors.   Past Medical History:  Diagnosis Date  . Anxiety   . Asthma   . Back pain   . Barrett esophagus   . CHF (congestive heart failure) ([place])   . CKD (chronic kidney disease) stage 3, GFR 30-59 ml/min (HCC)   . COPD (chronic obstructive pulmonary disease) ([place])   . Depression   . Diastolic heart failure ([place])   . Essential hypertension   . Fibromyalgia   . GERD (gastroesophageal reflux disease)   . Gout   . History of pericarditis   . Hyperlipidemia   . Hypothyroid   . Major depressive disorder   . Memory loss   . Nephrolithiasis    2008  . Obesity hypoventilation syndrome (HCC)    CPAP  . Obstructive sleep apnea   . PE (pulmonary embolism)    2010  . Peripheral neuropathy   . PTSD (post-traumatic stress disorder)   . Rheumatoid arthritis ([place])   . Schizophrenia ([place])   . Steroid dependence   . Type 2 diabetes mellitus ([place])   . Vitamin D deficiency     Patient Active Problem List   Diagnosis Date Noted  . Type 2 diabetes mellitus ([place]) 04/19/2018  . Acute kidney injury superimposed on  chronic kidney disease ([place]) 01/13/2018  . Altered mental status   . Encephalopathy 10/11/2016  . Chest pain 08/12/2016  . CKD (chronic kidney disease) stage 3, GFR 30-59 ml/min (HCC) 08/12/2016  . History of pulmonary embolus (PE) 08/12/2016  . Obesity hypoventilation syndrome ([place]) 08/12/2016  . RBBB 08/12/2016  . Positive D dimer 08/12/2016  . CVA (cerebral infarction) 11/10/2015  . Physical deconditioning 07/01/2011  . Weakness 07/01/2011  . Diabetic foot ulcer ([place]) 05/19/2011  . Polypharmacy 02/09/2011  . BACK PAIN WITH RADICULOPATHY 05/25/2010  . Type 2 diabetes mellitus with diabetic polyneuropathy, without Aarianna Hoadley-term current use of insulin ([place]) 08/18/2009  . Chronic diastolic heart failure ([place]) 04/25/2009  . Anemia 04/14/2009  . Sleep apnea 12/11/2008  . Diabetic peripheral neuropathy ([place]) 05/04/2007  . HYPERCHOLESTEROLEMIA 10/13/2006  . Gout, unspecified 10/13/2006  . HYPOKALEMIA 10/13/2006  . Schizophrenia ([place]) 10/13/2006  . Depression 10/13/2006  . Essential hypertension 10/13/2006  . Venous (peripheral) insufficiency 10/13/2006  . RHINITIS, ALLERGIC 10/13/2006  . Asthma 10/13/2006  . Reflux esophagitis 10/13/2006  . OSTEOARTHRITIS, MULTI SITES 10/13/2006  . INCONTINENCE, URGE 10/13/2006    Past Surgical History:  Procedure Laterality Date  . APPENDECTOMY    . CHOLECYSTECTOMY    . COLONOSCOPY     benign polyps (12/2000), repeat colonoscopy performed in 2007  . ENDOMETRIAL BIOPSY     10/03 benign  columnar mucosa (limited material)  . KIDNEY SURGERY      Allergies Benztropine mesylate; Codeine; Diazepam; Diphenhydramine hcl; Penicillins; Sulfonamide derivatives; Thioridazine hcl; and Lisinopril  Family History  Problem Relation Age of Onset  . Bone cancer Mother   . Hypertension Mother   . Heart disease Mother   . COPD Father   . Heart disease Father   . Stroke Father     Social History Social History   Tobacco Use  . Smoking status: Former  Smoker    Types: Cigarettes    Last attempt to quit: 06/23/1972    Years since quitting: 45.9  . Smokeless tobacco: Never Used  Substance Use Topics  . Alcohol use: No  . Drug use: No    Review of Systems  Constitutional: No fever/chills Eyes: No visual changes. ENT: No sore throat. Cardiovascular: Denies chest pain. Respiratory: Denies shortness of breath. Gastrointestinal: Positive lower abdominal pain.  No nausea, no vomiting.  No diarrhea.  No constipation. Genitourinary: Positive for dysuria. Musculoskeletal: Negative for back pain. Skin: Negative for rash. Neurological: Negative for headaches, focal weakness or numbness.  10-point ROS otherwise negative.  ____________________________________________   PHYSICAL EXAM:  VITAL SIGNS: ED Triage Vitals  Enc Vitals Group     BP 06/01/18 1432 140/89     Pulse Rate 06/01/18 1432 89     Resp 06/01/18 1432 20     Temp 06/01/18 1432 (!) 97.5 F (36.4 C)     Temp src --      SpO2 06/01/18 1432 97 %     Weight 06/01/18 1431 223 lb (101.2 kg)     Height 06/01/18 1431 5\' 3"  (1.6 m)     Pain Score 06/01/18 1431 6   Constitutional: Alert and oriented. Well appearing and in no acute distress. Eyes: Conjunctivae are normal.  Head: Atraumatic. Nose: No congestion/rhinnorhea. Mouth/Throat: Mucous membranes are moist.  Neck: No stridor.  Cardiovascular: Normal rate, regular rhythm. Good peripheral circulation. Grossly normal heart sounds.   Respiratory: Normal respiratory effort.  No retractions. Lungs CTAB. Gastrointestinal: Soft with tenderness to palpation in the suprapubic area. No distention.  Musculoskeletal: No lower extremity tenderness nor edema. No gross deformities of extremities. Neurologic:  Normal speech and language. No gross focal neurologic deficits are appreciated.  Skin:  Skin is warm, dry and intact. No rash noted.  ____________________________________________   LABS (all labs ordered are listed, but only  abnormal results are displayed)  Labs Reviewed  COMPREHENSIVE METABOLIC PANEL - Abnormal; Notable for the following components:      Result Value   Glucose, Bld 248 (*)    BUN 47 (*)    Creatinine, Ser 2.72 (*)    Calcium 8.6 (*)    Albumin 2.9 (*)    GFR calc non Af Amer 17 (*)    GFR calc Af Amer 20 (*)    All other components within normal limits  CBC - Abnormal; Notable for the following components:   RBC 2.87 (*)    Hemoglobin 9.2 (*)    HCT 28.5 (*)    All other components within normal limits  URINALYSIS, ROUTINE W REFLEX MICROSCOPIC - Abnormal; Notable for the following components:   APPearance CLOUDY (*)    Glucose, UA 150 (*)    Protein, ur >=300 (*)    Leukocytes, UA MODERATE (*)    WBC, UA >50 (*)    Bacteria, UA RARE (*)    All other components within normal limits  LIPASE, BLOOD  Outside Urine Culture Results:      ____________________________________________  RADIOLOGY  Ct Renal Stone Study  Result Date: 06/01/2018 CLINICAL DATA:  Dysuria and foul-smelling urine. Lower abdominal cramping times 10 days. EXAM: CT ABDOMEN AND PELVIS WITHOUT CONTRAST TECHNIQUE: Multidetector CT imaging of the abdomen and pelvis was performed following the standard protocol without IV contrast. COMPARISON:  08/25/2017 FINDINGS: Lower chest: Top-normal included heart size. Thoracic aortic atherosclerosis and coronary arteriosclerosis involving the left main, LAD and RCA. Subsegmental atelectasis at each lung base with new subpleural ovoid nodular density measuring 13 x 9 mm, average 11 mm, new since recent comparison a more likely to represent a focus of subpleural atelectasis, possibly a rounded atelectasis. A pulmonary nodule is not entirely excluded. Hepatobiliary: Cholecystectomy. No space-occupying mass of the liver. No biliary dilatation. Pancreas: Fatty infiltration of the pancreas. No ductal dilatation, mass or acute inflammation. Spleen: Normal size spleen.  No focal mass.  Adrenals/Urinary Tract: Nonspecific perinephric fat stranding which can be a variant of normal versus stigmata ascending urinary tract infection. Findings are similar to prior. Probable proteinaceous or hemorrhagic cyst accounting for a subcentimeter rounded hyperdensity in the left kidney measuring 4 mm. No nephrolithiasis nor hydroureteronephrosis. The urinary bladder is partially distended slightly thick-walled as a result. Cystitis is not entirely excluded. Stomach/Bowel: Hiatal hernia. The stomach is slightly distended with ingested food. Normal small bowel rotation without obstruction or inflammation. Distal and terminal ileum are unremarkable. Appendectomy by report. Moderate stool retention within the colon. Vascular/Lymphatic: Mild aortoiliac atherosclerosis. Lymphadenopathy. Reproductive: Uterus and bilateral adnexa are unremarkable. Other: Fat containing periumbilical hernia. No free air nor abdominopelvic ascites. Mild soft tissue anasarca. Musculoskeletal: Thoracolumbar spondylosis. L3 through S1 facet arthropathy. Superior endplate Schmorl's node at L5. Degenerative disc disease L2-3 and L3-4 with grade 1 anterolisthesis of L3 on L4 likely on the basis of degenerative disc disease. Small calcified disc bulge at L3-4. IMPRESSION: 1. Coronary arteriosclerosis. 2. Subpleural ovoid nodular density in the right lower lobe, new since prior averaging 11 mm likely representing rounded atelectasis. Pulmonary nodule is not entirely excluded but believed less likely. Consider one of the following in 3 months for both low-risk and high-risk individuals: (a) repeat chest CT, (b) follow-up PET-CT, or (c) tissue sampling. This recommendation follows the consensus statement: Guidelines for Management of Incidental Pulmonary Nodules Detected on CT Images: From the Fleischner Society 2017; Radiology 2017; 284:228-243. 3. Stable nonspecific perinephric fat stranding which is more likely a variance. This can also be seen  in ascending urinary tract infections. Stable slightly thick-walled appearance of the urinary bladder is believed secondary to underdistention. Given history of dysuria however cystitis might also account for this appearance. Correlation with urinalysis suggested. 4. Small fat containing periumbilical hernia. 5. Thoracolumbar spondylosis. Degenerative changes of the facets and discs as above. Electronically Signed   By: Ashley Royalty M.D.   On: 06/01/2018 20:10    ____________________________________________   PROCEDURES  Procedure(s) performed:   Procedures  None ____________________________________________   INITIAL IMPRESSION / ASSESSMENT AND PLAN / ED COURSE  Pertinent labs & imaging results that were available during my care of the patient were reviewed by me and considered in my medical decision making (see chart for details).  The emergency department with lower abdominal and back pain with known UTI.  The culture from her outpatient provider shows E. coli which is resistant to Cipro and Levaquin.  It is sensitive to cephalosporins but patient has a severe penicillin allergy which sounds like anaphylaxis.  I discussed  with pharmacy who went back in her records and do not see where she has received cephalosporins in the past.  UTI is also sensitive to nitrofurantoin but patient has chronic kidney disease and this is not a good choice.  Plan to discuss culture results with infectious disease.  Also obtain a CT renal to rule out stone aspect of the patient's lower abdominal pain is most likely secondary to acute cystitis.  08:00 PM Spoke with Dr. Linus Salmons with ID. Discussed the case, labs, and allergic reaction history. He recommends Phosphomycin once now and then again in 3 days. No leukocytosis or fever. No concern clinically for pyelonephritis. Patient is well-appearing and tolerating PO well here. No evidence to suspect urosepsis. Plan for plan as above and close PCP follow up.    ____________________________________________  FINAL CLINICAL IMPRESSION(S) / ED DIAGNOSES  Final diagnoses:  Acute cystitis without hematuria     MEDICATIONS GIVEN DURING THIS VISIT:  Medications  fosfomycin (MONUROL) packet 3 g (3 g Oral Given 06/01/18 2039)     NEW OUTPATIENT MEDICATIONS STARTED DURING THIS VISIT:  Discharge Medication List as of 06/01/2018  9:13 PM    START taking these medications   Details  fosfomycin (MONUROL) 3 g PACK Take 3 g by mouth once for 1 dose., Starting Sun 06/04/2018, Print        Note:  This document was prepared using Dragon voice recognition software and may include unintentional dictation errors.  Nanda Quinton, MD Emergency Medicine    Ciarra Braddy, [facility], MD 06/02/18 1020

## 2018-06-01 NOTE — Discharge Instructions (Signed)
You have been seen in the Emergency Department (ED) today for pain when urinating.  Your workup today suggests that you have a urinary tract infection (UTI).  Please take your antibiotic as prescribed and over-the-counter pain medication (Tylenol) as needed, but no more than recommended on the label instructions.  Drink PLENTY of fluids.  We spoke with the infectious disease doctors and they recommend starting a new antibiotic. Take the second dose of this on Sunday.   Call your regular doctor to schedule the next available appointment to follow up on todays ED visit, or return immediately to the ED if your pain worsens, you have decreased urine production, develop fever, persistent vomiting, or other symptoms that concern you.

## 2018-06-02 ENCOUNTER — Inpatient Hospital Stay (HOSPITAL_COMMUNITY): Admission: RE | Admit: 2018-06-02 | Payer: Medicare Other | Source: Ambulatory Visit

## 2018-06-03 ENCOUNTER — Emergency Department (HOSPITAL_COMMUNITY)
Admission: EM | Admit: 2018-06-03 | Discharge: 2018-06-03 | Disposition: A | Discharge status: Home / Self Care | Payer: Medicare Other | Attending: Emergency Medicine | Admitting: Emergency Medicine

## 2018-06-03 ENCOUNTER — Other Ambulatory Visit: Payer: Self-pay

## 2018-06-03 ENCOUNTER — Encounter (HOSPITAL_COMMUNITY): Payer: Self-pay

## 2018-06-03 ENCOUNTER — Emergency Department (HOSPITAL_COMMUNITY): Payer: Medicare Other

## 2018-06-03 DIAGNOSIS — Z87891 Personal history of nicotine dependence: Secondary | ICD-10-CM | POA: Diagnosis not present

## 2018-06-03 DIAGNOSIS — F419 Anxiety disorder, unspecified: Secondary | ICD-10-CM | POA: Insufficient documentation

## 2018-06-03 DIAGNOSIS — R2232 Localized swelling, mass and lump, left upper limb: Secondary | ICD-10-CM | POA: Diagnosis present

## 2018-06-03 DIAGNOSIS — Z7982 Long term (current) use of aspirin: Secondary | ICD-10-CM | POA: Insufficient documentation

## 2018-06-03 DIAGNOSIS — Z7984 Long term (current) use of oral hypoglycemic drugs: Secondary | ICD-10-CM | POA: Insufficient documentation

## 2018-06-03 DIAGNOSIS — J45909 Unspecified asthma, uncomplicated: Secondary | ICD-10-CM | POA: Insufficient documentation

## 2018-06-03 DIAGNOSIS — E1142 Type 2 diabetes mellitus with diabetic polyneuropathy: Secondary | ICD-10-CM | POA: Diagnosis not present

## 2018-06-03 DIAGNOSIS — D649 Anemia, unspecified: Secondary | ICD-10-CM | POA: Diagnosis not present

## 2018-06-03 DIAGNOSIS — I5032 Chronic diastolic (congestive) heart failure: Secondary | ICD-10-CM | POA: Insufficient documentation

## 2018-06-03 DIAGNOSIS — F329 Major depressive disorder, single episode, unspecified: Secondary | ICD-10-CM | POA: Insufficient documentation

## 2018-06-03 DIAGNOSIS — Z79899 Other long term (current) drug therapy: Secondary | ICD-10-CM | POA: Diagnosis not present

## 2018-06-03 DIAGNOSIS — E1122 Type 2 diabetes mellitus with diabetic chronic kidney disease: Secondary | ICD-10-CM | POA: Insufficient documentation

## 2018-06-03 DIAGNOSIS — F209 Schizophrenia, unspecified: Secondary | ICD-10-CM | POA: Insufficient documentation

## 2018-06-03 DIAGNOSIS — M19042 Primary osteoarthritis, left hand: Secondary | ICD-10-CM | POA: Diagnosis not present

## 2018-06-03 DIAGNOSIS — Z9049 Acquired absence of other specified parts of digestive tract: Secondary | ICD-10-CM | POA: Diagnosis not present

## 2018-06-03 DIAGNOSIS — N183 Chronic kidney disease, stage 3 (moderate): Secondary | ICD-10-CM | POA: Insufficient documentation

## 2018-06-03 DIAGNOSIS — J45901 Unspecified asthma with (acute) exacerbation: Secondary | ICD-10-CM | POA: Diagnosis not present

## 2018-06-03 DIAGNOSIS — N289 Disorder of kidney and ureter, unspecified: Secondary | ICD-10-CM

## 2018-06-03 DIAGNOSIS — E039 Hypothyroidism, unspecified: Secondary | ICD-10-CM | POA: Insufficient documentation

## 2018-06-03 DIAGNOSIS — I130 Hypertensive heart and chronic kidney disease with heart failure and stage 1 through stage 4 chronic kidney disease, or unspecified chronic kidney disease: Secondary | ICD-10-CM | POA: Diagnosis not present

## 2018-06-03 DIAGNOSIS — M79642 Pain in left hand: Secondary | ICD-10-CM | POA: Diagnosis not present

## 2018-06-03 LAB — CBC WITH DIFFERENTIAL/PLATELET
Abs Immature Granulocytes: 0.04 10*3/uL (ref 0.00–0.07)
Basophils Absolute: 0 10*3/uL (ref 0.0–0.1)
Basophils Relative: 0 %
Eosinophils Absolute: 0.2 10*3/uL (ref 0.0–0.5)
Eosinophils Relative: 3 %
HCT: 26.2 % — ABNORMAL LOW (ref 36.0–46.0)
Hemoglobin: 8.6 g/dL — ABNORMAL LOW (ref 12.0–15.0)
Immature Granulocytes: 1 %
Lymphocytes Relative: 12 %
Lymphs Abs: 0.9 10*3/uL (ref 0.7–4.0)
MCH: 32.2 pg (ref 26.0–34.0)
MCHC: 32.8 g/dL (ref 30.0–36.0)
MCV: 98.1 fL (ref 80.0–100.0)
Monocytes Absolute: 0.7 10*3/uL (ref 0.1–1.0)
Monocytes Relative: 9 %
Neutro Abs: 5.9 10*3/uL (ref 1.7–7.7)
Neutrophils Relative %: 75 %
Platelets: 189 10*3/uL (ref 150–400)
RBC: 2.67 MIL/uL — ABNORMAL LOW (ref 3.87–5.11)
RDW: 13.2 % (ref 11.5–15.5)
WBC: 7.8 10*3/uL (ref 4.0–10.5)
nRBC: 0 % (ref 0.0–0.2)

## 2018-06-03 LAB — BASIC METABOLIC PANEL
Anion gap: 7 (ref 5–15)
BUN: 36 mg/dL — ABNORMAL HIGH (ref 8–23)
CO2: 26 mmol/L (ref 22–32)
Calcium: 8.8 mg/dL — ABNORMAL LOW (ref 8.9–10.3)
Chloride: 106 mmol/L (ref 98–111)
Creatinine, Ser: 2.35 mg/dL — ABNORMAL HIGH (ref 0.44–1.00)
GFR calc Af Amer: 24 mL/min — ABNORMAL LOW (ref >60)
GFR calc non Af Amer: 20 mL/min — ABNORMAL LOW (ref >60)
Glucose, Bld: 260 mg/dL — ABNORMAL HIGH (ref 70–99)
Potassium: 4.2 mmol/L (ref 3.5–5.1)
Sodium: 139 mmol/L (ref 135–145)

## 2018-06-03 MED ORDER — ACETAMINOPHEN 325 MG PO TABS
650.0000 mg | ORAL_TABLET | Freq: Once | ORAL | Status: AC
  Administered 2018-06-03: 650 mg via ORAL
  Filled 2018-06-03: qty 2

## 2018-06-03 MED ORDER — SODIUM CHLORIDE 0.9 % IV SOLN
INTRAVENOUS | Status: DC
  Administered 2018-06-03: 125 mL/h via INTRAVENOUS

## 2018-06-03 NOTE — ED Provider Notes (Signed)
[facility] EMERGENCY DEPARTMENT Provider Note   CSN: [id number] Arrival date & time: 06/03/18  1252     History   Chief Complaint Chief Complaint  Patient presents with  . Hand Problem    HPI [name] is a 67 y.o. female.  HPI   She presents for evaluation of left hand pain with swelling present for several weeks.  She also complains of swelling in her face.  She lives in a nursing care facility, where she receives supervised care.  She complains of "not feeling well."  Is unable to be more specific about that.  She denies fever, chills, cough, shortness of breath, chest pain, focal weakness or paresthesia.  There are no other known modifying factors.  Past Medical History:  Diagnosis Date  . Anxiety   . Asthma   . Back pain   . Barrett esophagus   . CHF (congestive heart failure) ([place])   . CKD (chronic kidney disease) stage 3, GFR 30-59 ml/min (HCC)   . COPD (chronic obstructive pulmonary disease) ([place])   . Depression   . Diastolic heart failure ([place])   . Essential hypertension   . Fibromyalgia   . GERD (gastroesophageal reflux disease)   . Gout   . History of pericarditis   . Hyperlipidemia   . Hypothyroid   . Major depressive disorder   . Memory loss   . Nephrolithiasis    2008  . Obesity hypoventilation syndrome (HCC)    CPAP  . Obstructive sleep apnea   . PE (pulmonary embolism)    2010  . Peripheral neuropathy   . PTSD (post-traumatic stress disorder)   . Rheumatoid arthritis ([place])   . Schizophrenia ([place])   . Steroid dependence   . Type 2 diabetes mellitus ([place])   . Vitamin D deficiency     Patient Active Problem List   Diagnosis Date Noted  . Type 2 diabetes mellitus ([place]) 04/19/2018  . Acute kidney injury superimposed on chronic kidney disease ([place]) 01/13/2018  . Altered mental status   . Encephalopathy 10/11/2016  . Chest pain 08/12/2016  . CKD (chronic kidney disease) stage 3, GFR 30-59 ml/min (HCC) 08/12/2016  . History of  pulmonary embolus (PE) 08/12/2016  . Obesity hypoventilation syndrome ([place]) 08/12/2016  . RBBB 08/12/2016  . Positive D dimer 08/12/2016  . CVA (cerebral infarction) 11/10/2015  . Physical deconditioning 07/01/2011  . Weakness 07/01/2011  . Diabetic foot ulcer ([place]) 05/19/2011  . Polypharmacy 02/09/2011  . BACK PAIN WITH RADICULOPATHY 05/25/2010  . Type 2 diabetes mellitus with diabetic polyneuropathy, without long-term current use of insulin ([place]) 08/18/2009  . Chronic diastolic heart failure ([place]) 04/25/2009  . Anemia 04/14/2009  . Sleep apnea 12/11/2008  . Diabetic peripheral neuropathy ([place]) 05/04/2007  . HYPERCHOLESTEROLEMIA 10/13/2006  . Gout, unspecified 10/13/2006  . HYPOKALEMIA 10/13/2006  . Schizophrenia ([place]) 10/13/2006  . Depression 10/13/2006  . Essential hypertension 10/13/2006  . Venous (peripheral) insufficiency 10/13/2006  . RHINITIS, ALLERGIC 10/13/2006  . Asthma 10/13/2006  . Reflux esophagitis 10/13/2006  . OSTEOARTHRITIS, MULTI SITES 10/13/2006  . INCONTINENCE, URGE 10/13/2006    Past Surgical History:  Procedure Laterality Date  . APPENDECTOMY    . CHOLECYSTECTOMY    . COLONOSCOPY     benign polyps (12/2000), repeat colonoscopy performed in 2007  . ENDOMETRIAL BIOPSY     10/03 benign columnar mucosa (limited material)  . KIDNEY SURGERY       OB History   None      Home Medications  Prior to Admission medications   Medication Sig Start Date End Date Taking? Authorizing Provider  acetaminophen (TYLENOL) 325 MG tablet Take 325 mg by mouth 2 (two) times daily.     [provider]  albuterol (PROVENTIL HFA;VENTOLIN HFA) 108 (90 Base) MCG/ACT inhaler Inhale 2 puffs into the lungs every 6 (six) hours as needed for wheezing or shortness of breath.    [provider]  albuterol (PROVENTIL) (2.5 MG/3ML) 0.083% nebulizer solution Take 2.5 mg by nebulization every 6 (six) hours as needed for wheezing or shortness of breath.    [provider]  aspirin 81 MG tablet Take 81 mg by mouth daily.    [provider]  atorvastatin (LIPITOR) 40 MG tablet Take 40 mg by mouth daily.     [provider]  budesonide-formoterol (SYMBICORT) 160-4.5 MCG/ACT inhaler Inhale 2 puffs into the lungs 2 (two) times daily.    [provider]  carvedilol (COREG) 25 MG tablet Take 25 mg by mouth 2 (two) times daily with a meal.    [provider]  Cholecalciferol (VITAMIN D) 2000 units CAPS Take 4,000 Units by mouth daily.     [provider]  docusate sodium (COLACE) 100 MG capsule Take 100 mg by mouth daily.     [provider]  doxycycline (VIBRAMYCIN) 100 MG capsule Take 1 capsule (100 mg total) by mouth 2 (two) times daily. Patient not taking: Reported on 06/01/2018 04/27/18   Noemi Chapel, MD  DULoxetine (CYMBALTA) 60 MG capsule Take 60 mg by mouth daily.    [provider]  EPINEPHrine (EPI-PEN) 0.3 mg/0.3 mL DEVI Inject 0.3 mg into the muscle once.    [provider]  fosfomycin (MONUROL) 3 g PACK Take 3 g by mouth once for 1 dose. 06/04/18 06/04/18  LongWonda Olds, MD  gabapentin (NEURONTIN) 300 MG capsule Take 300-600 mg by mouth See admin instructions. 300mg  twice daily and 600mg  at bedtime 04/29/17   [provider]  glipiZIDE (GLUCOTROL XL) 5 MG 24 hr tablet Take 5 mg by mouth daily with breakfast.     [provider]  hydrocortisone 2.5 % ointment Apply 1 application topically daily as needed (itchy skin).    [provider]  hydrOXYzine (VISTARIL) 25 MG capsule Take 25 mg by mouth 2 (two) times daily as needed for anxiety.     [provider]  levothyroxine (SYNTHROID, LEVOTHROID) 25 MCG tablet Take 25 mcg by mouth daily before breakfast.    [provider]  linagliptin (TRADJENTA) 5 MG TABS tablet Take 5 mg by mouth daily.    [provider]  loperamide (IMODIUM) 2 MG capsule Take 2 mg by mouth every 8  (eight) hours as needed for diarrhea or loose stools.     [provider]  LORazepam (ATIVAN) 1 MG tablet Take 1 mg by mouth daily as needed for anxiety.     [provider]  losartan (COZAAR) 100 MG tablet Take 100 mg by mouth daily.    [provider]  montelukast (SINGULAIR) 10 MG tablet Take 10 mg by mouth at bedtime.    [provider]  Multiple Vitamin (DAILY VITE PO) Take 1 tablet by mouth daily.    [provider]  omeprazole (PRILOSEC) 20 MG capsule Take 20 mg by mouth daily.     [provider]  prazosin (MINIPRESS) 2 MG capsule Take 2 mg by mouth at bedtime. For PTSD associated nightmares    [provider]  pyridOXINE (VITAMIN B-6) 100 MG tablet Take 200 mg by mouth 2 (two) times daily.    [provider]  risperiDONE (RISPERDAL) 0.5 MG tablet Take 0.5 mg by mouth 2 (two) times daily.     [provider]  spironolactone (ALDACTONE) 25 MG tablet Take 1 tablet (25 mg total) by mouth daily. 04/12/18   Mesner, Corene Cornea, MD  torsemide (DEMADEX) 10 MG tablet Take 40 mg by mouth daily.  04/29/17   [provider]  traZODone (DESYREL) 100 MG tablet Take 100 mg by mouth at bedtime as needed for sleep.     [provider]    Family History Family History  Problem Relation Age of Onset  . Bone cancer Mother   . Hypertension Mother   . Heart disease Mother   . COPD Father   . Heart disease Father   . Stroke Father     Social History Social History   Tobacco Use  . Smoking status: Former Smoker    Types: Cigarettes    Last attempt to quit: 06/23/1972    Years since quitting: 45.9  . Smokeless tobacco: Never Used  Substance Use Topics  . Alcohol use: No  . Drug use: No     Allergies   Benztropine mesylate; Codeine; Diazepam; Diphenhydramine hcl; Penicillins; Sulfonamide derivatives; Thioridazine hcl; and Lisinopril   Review of Systems Review of Systems  All other systems reviewed  and are negative.    Physical Exam Updated Vital Signs BP (!) 160/82 (BP Location: Left Arm)   Pulse 88   Temp 98.4 F (36.9 C) (Oral)   Resp 18   Ht 5\' 2"  (1.575 m)   Wt 104.8 kg   SpO2 98%   BMI 42.25 kg/m   Physical Exam  Constitutional: She is oriented to person, place, and time. She appears well-developed.  Elderly, appears older than stated age  HENT:  Head: Normocephalic and atraumatic.  Eyes: Pupils are equal, round, and reactive to light. Conjunctivae and EOM are normal.  Neck: Normal range of motion and phonation normal. Neck supple.  Cardiovascular: Normal rate, regular rhythm and normal heart sounds.  Pulmonary/Chest: Effort normal and breath sounds normal. No stridor. No respiratory distress. She exhibits no tenderness.  Abdominal: Soft. She exhibits no distension. There is no tenderness. There is no guarding.  Musculoskeletal: Normal range of motion.  Localized swelling with pain left hand.  No associated skin lesion.  Normal range of motion left hand, wrist, elbow and shoulder.  Neurological: She is alert and oriented to person, place, and time. She exhibits normal muscle tone.  Skin: Skin is warm and dry.  Mild periorbital edema, bilateral face.  Psychiatric: She has a normal mood and affect. Her behavior is normal. Judgment and thought content normal.  Nursing note and vitals reviewed.    ED Treatments / Results  Labs (all labs ordered are listed, but only abnormal results are displayed) Labs Reviewed  BASIC METABOLIC PANEL - Abnormal; Notable for the following components:      Result Value   Glucose, Bld 260 (*)    BUN 36 (*)    Creatinine, Ser 2.35 (*)    Calcium 8.8 (*)    GFR calc non Af Amer 20 (*)    GFR calc Af Amer 24 (*)    All other components within normal limits  CBC WITH DIFFERENTIAL/PLATELET - Abnormal; Notable for the following components:   RBC 2.67 (*)    Hemoglobin 8.6 (*)  HCT 26.2 (*)    All other components within normal  limits    EKG None  Radiology Dg Hand Complete Left  Result Date: 06/03/2018 CLINICAL DATA:  Acute left hand pain. EXAM: LEFT HAND - COMPLETE 3+ VIEW COMPARISON:  None. FINDINGS: There is no evidence of fracture or dislocation. Severe degenerative changes seen involving the first carpometacarpal joint. Soft tissues are unremarkable. IMPRESSION: Osteoarthritis of the first carpometacarpal joint. No acute abnormality seen in the left hand. Electronically Signed   By: Marijo Conception, M.D.   On: 06/03/2018 14:22   Ct Renal Stone Study  Result Date: 06/01/2018 CLINICAL DATA:  Dysuria and foul-smelling urine. Lower abdominal cramping times 10 days. EXAM: CT ABDOMEN AND PELVIS WITHOUT CONTRAST TECHNIQUE: Multidetector CT imaging of the abdomen and pelvis was performed following the standard protocol without IV contrast. COMPARISON:  08/25/2017 FINDINGS: Lower chest: Top-normal included heart size. Thoracic aortic atherosclerosis and coronary arteriosclerosis involving the left main, LAD and RCA. Subsegmental atelectasis at each lung base with new subpleural ovoid nodular density measuring 13 x 9 mm, average 11 mm, new since recent comparison a more likely to represent a focus of subpleural atelectasis, possibly a rounded atelectasis. A pulmonary nodule is not entirely excluded. Hepatobiliary: Cholecystectomy. No space-occupying mass of the liver. No biliary dilatation. Pancreas: Fatty infiltration of the pancreas. No ductal dilatation, mass or acute inflammation. Spleen: Normal size spleen.  No focal mass. Adrenals/Urinary Tract: Nonspecific perinephric fat stranding which can be a variant of normal versus stigmata ascending urinary tract infection. Findings are similar to prior. Probable proteinaceous or hemorrhagic cyst accounting for a subcentimeter rounded hyperdensity in the left kidney measuring 4 mm. No nephrolithiasis nor hydroureteronephrosis. The urinary bladder is partially distended slightly  thick-walled as a result. Cystitis is not entirely excluded. Stomach/Bowel: Hiatal hernia. The stomach is slightly distended with ingested food. Normal small bowel rotation without obstruction or inflammation. Distal and terminal ileum are unremarkable. Appendectomy by report. Moderate stool retention within the colon. Vascular/Lymphatic: Mild aortoiliac atherosclerosis. Lymphadenopathy. Reproductive: Uterus and bilateral adnexa are unremarkable. Other: Fat containing periumbilical hernia. No free air nor abdominopelvic ascites. Mild soft tissue anasarca. Musculoskeletal: Thoracolumbar spondylosis. L3 through S1 facet arthropathy. Superior endplate Schmorl's node at L5. Degenerative disc disease L2-3 and L3-4 with grade 1 anterolisthesis of L3 on L4 likely on the basis of degenerative disc disease. Small calcified disc bulge at L3-4. IMPRESSION: 1. Coronary arteriosclerosis. 2. Subpleural ovoid nodular density in the right lower lobe, new since prior averaging 11 mm likely representing rounded atelectasis. Pulmonary nodule is not entirely excluded but believed less likely. Consider one of the following in 3 months for both low-risk and high-risk individuals: (a) repeat chest CT, (b) follow-up PET-CT, or (c) tissue sampling. This recommendation follows the consensus statement: Guidelines for Management of Incidental Pulmonary Nodules Detected on CT Images: From the Fleischner Society 2017; Radiology 2017; 284:228-243. 3. Stable nonspecific perinephric fat stranding which is more likely a variance. This can also be seen in ascending urinary tract infections. Stable slightly thick-walled appearance of the urinary bladder is believed secondary to underdistention. Given history of dysuria however cystitis might also account for this appearance. Correlation with urinalysis suggested. 4. Small fat containing periumbilical hernia. 5. Thoracolumbar spondylosis. Degenerative changes of the facets and discs as above.  Electronically Signed   By: Ashley Royalty M.D.   On: 06/01/2018 20:10    Procedures Procedures (including critical care time)  Medications Ordered in ED Medications  0.9 %  sodium chloride  infusion ( Intravenous Stopped 06/03/18 1519)  acetaminophen (TYLENOL) tablet 650 mg (650 mg Oral Given 06/03/18 1511)     Initial Impression / Assessment and Plan / ED Course  I have reviewed the triage vital signs and the nursing notes.  Pertinent labs & imaging results that were available during my care of the patient were reviewed by me and considered in my medical decision making (see chart for details).  Clinical Course as of Jun 03 1520  Sat Jun 03, 2018  1456 Normal except glucose high, BUN high, creatinine high, calcium low, GFR low  Basic metabolic panel(!) [EW]  0350 Normal except hemoglobin low  CBC with Differential(!) [EW]  1456 Osteoarthritis, first MCP, images reviewed by me  DG Hand Complete Left [EW]  0938 CBC with Differential(!) [EW]    Clinical Course User Index [EW] Daleen Bo, MD     Patient Vitals for the past 24 hrs:  BP Temp Temp src Pulse Resp SpO2 Height Weight  06/03/18 1434 (!) 160/82 - - 88 18 98 % - -  06/03/18 1301 - - - - - - 5\' 2"  (1.575 m) 104.8 kg  06/03/18 1300 (!) 153/60 98.4 F (36.9 C) Oral 90 12 100 % - -    2:56 PM Reevaluation with update and discussion. After initial assessment and treatment, an updated evaluation reveals no change in clinical status.Daleen Bo   Medical Decision Making: Nonspecific hand swelling, likely related to osteoarthritis.  Swelling face is nonspecific and does not represent airway angioedema.  Possible allergic mediated.  Screening blood work indicates improving volume depletion, and stable anemia; as compared with prior labs.  No indication for further ED evaluation or treatment at this time.  CRITICAL CARE-no Performed by: Daleen Bo  Nursing Notes Reviewed/ Care Coordinated Applicable Imaging  Reviewed Interpretation of Laboratory Data incorporated into ED treatment  The patient appears reasonably screened and/or stabilized for discharge and I doubt any other medical condition or other [facility] requiring further screening, evaluation, or treatment in the ED at this time prior to discharge.  Plan: Home Medications-continue usual medication use Tylenol for pain; Home Treatments-heat application to hand; return here if the recommended treatment, does not improve the symptoms; Recommended follow up-PCP as needed      Final Clinical Impressions(s) / ED Diagnoses   Final diagnoses:  Primary osteoarthritis of left hand  Renal insufficiency  Anemia, unspecified type    ED Discharge Orders    None       Daleen Bo, MD 06/03/18 1521

## 2018-06-03 NOTE — ED Notes (Signed)
Pt c/o left hand swelling for 2 weeks.  Ring to ring finger on left removed and placed on right hand.

## 2018-06-03 NOTE — Discharge Instructions (Addendum)
For the discomfort of your left hand use heat on the sore area 3 or 4 times a day.  Also, use Tylenol 650 mg every 4 hours as needed for pain.  You have blood abnormalities, which have previously been present.  They include renal insufficiency and anemia.  Please discuss with your doctor ongoing management of these disorders.

## 2018-06-03 NOTE — ED Triage Notes (Addendum)
Pt's states her left hand has been swollen for the last 2 weeks. Also states left eye is swollen. Minimal swelling noted to left eye. Patient is able to see clearly. NAD. Caretaker present with her. Pt states she was started on a new antibiotic on Thursday for a UTI

## 2018-06-05 ENCOUNTER — Encounter (HOSPITAL_COMMUNITY): Payer: Self-pay | Admitting: *Deleted

## 2018-06-05 ENCOUNTER — Emergency Department (HOSPITAL_COMMUNITY)
Admission: EM | Admit: 2018-06-05 | Discharge: 2018-06-05 | Disposition: A | Discharge status: Home / Self Care | Payer: Medicare Other | Attending: Emergency Medicine | Admitting: Emergency Medicine

## 2018-06-05 ENCOUNTER — Other Ambulatory Visit: Payer: Self-pay

## 2018-06-05 DIAGNOSIS — Z87891 Personal history of nicotine dependence: Secondary | ICD-10-CM | POA: Diagnosis not present

## 2018-06-05 DIAGNOSIS — I110 Hypertensive heart disease with heart failure: Secondary | ICD-10-CM | POA: Diagnosis not present

## 2018-06-05 DIAGNOSIS — I130 Hypertensive heart and chronic kidney disease with heart failure and stage 1 through stage 4 chronic kidney disease, or unspecified chronic kidney disease: Secondary | ICD-10-CM | POA: Diagnosis not present

## 2018-06-05 DIAGNOSIS — R739 Hyperglycemia, unspecified: Secondary | ICD-10-CM

## 2018-06-05 DIAGNOSIS — I5032 Chronic diastolic (congestive) heart failure: Secondary | ICD-10-CM | POA: Diagnosis not present

## 2018-06-05 DIAGNOSIS — J45909 Unspecified asthma, uncomplicated: Secondary | ICD-10-CM | POA: Diagnosis not present

## 2018-06-05 DIAGNOSIS — E1122 Type 2 diabetes mellitus with diabetic chronic kidney disease: Secondary | ICD-10-CM | POA: Insufficient documentation

## 2018-06-05 DIAGNOSIS — E1165 Type 2 diabetes mellitus with hyperglycemia: Secondary | ICD-10-CM | POA: Diagnosis not present

## 2018-06-05 DIAGNOSIS — N183 Chronic kidney disease, stage 3 (moderate): Secondary | ICD-10-CM | POA: Diagnosis not present

## 2018-06-05 LAB — CBG MONITORING, ED
Glucose-Capillary: 310 mg/dL — ABNORMAL HIGH (ref 70–99)
Glucose-Capillary: 315 mg/dL — ABNORMAL HIGH (ref 70–99)

## 2018-06-05 NOTE — ED Notes (Signed)
Called facility to inform staff pt is ready for discharge; staff member stated someone would be here shortly

## 2018-06-05 NOTE — ED Triage Notes (Signed)
Brought in for evaluation of blood sugar

## 2018-06-05 NOTE — Discharge Instructions (Addendum)
Your blood sugars here taken a couple hours apart are only slightly higher than your reported (and recorded) average. Please continue to stay hydrated. Check your blood sugars every 6 hours for the next 48 hours to ensure not worsening. Also return here if any concerning associated symptoms as per hand out.

## 2018-06-05 NOTE — ED Notes (Signed)
Water given per dr Dayna Barker. Will recheck cbg around 930pm

## 2018-06-05 NOTE — ED Provider Notes (Signed)
Emergency Department Provider Note   I have reviewed the triage vital signs and the nursing notes.   HISTORY  Chief Complaint Hyperglycemia   HPI [name] is a 67 y.o. female who presents the emergency department today secondary to hyperglycemia.  Patient states that her blood sugar was 478 prior to arrival.  She did not take any medications prior to coming.  She states she had no other symptoms.  No nausea, vomiting, abdominal pain, back pain, chest pain, shortness of breath or other associated symptoms.  She states she lives in a facility and they gave her all her medications but she think she has had them all. No other associated or modifying symptoms.    Past Medical History:  Diagnosis Date  . Anxiety   . Asthma   . Back pain   . Barrett esophagus   . CHF (congestive heart failure) ([place])   . CKD (chronic kidney disease) stage 3, GFR 30-59 ml/min (HCC)   . COPD (chronic obstructive pulmonary disease) ([place])   . Depression   . Diastolic heart failure ([place])   . Essential hypertension   . Fibromyalgia   . GERD (gastroesophageal reflux disease)   . Gout   . History of pericarditis   . Hyperlipidemia   . Hypothyroid   . Major depressive disorder   . Memory loss   . Nephrolithiasis    2008  . Obesity hypoventilation syndrome (HCC)    CPAP  . Obstructive sleep apnea   . PE (pulmonary embolism)    2010  . Peripheral neuropathy   . PTSD (post-traumatic stress disorder)   . Rheumatoid arthritis ([place])   . Schizophrenia ([place])   . Steroid dependence   . Type 2 diabetes mellitus ([place])   . Vitamin D deficiency     Patient Active Problem List   Diagnosis Date Noted  . Type 2 diabetes mellitus ([place]) 04/19/2018  . Acute kidney injury superimposed on chronic kidney disease ([place]) 01/13/2018  . Altered mental status   . Encephalopathy 10/11/2016  . Chest pain 08/12/2016  . CKD (chronic kidney disease) stage 3, GFR 30-59 ml/min (HCC) 08/12/2016  . History of  pulmonary embolus (PE) 08/12/2016  . Obesity hypoventilation syndrome ([place]) 08/12/2016  . RBBB 08/12/2016  . Positive D dimer 08/12/2016  . CVA (cerebral infarction) 11/10/2015  . Physical deconditioning 07/01/2011  . Weakness 07/01/2011  . Diabetic foot ulcer ([place]) 05/19/2011  . Polypharmacy 02/09/2011  . BACK PAIN WITH RADICULOPATHY 05/25/2010  . Type 2 diabetes mellitus with diabetic polyneuropathy, without long-term current use of insulin ([place]) 08/18/2009  . Chronic diastolic heart failure ([place]) 04/25/2009  . Anemia 04/14/2009  . Sleep apnea 12/11/2008  . Diabetic peripheral neuropathy ([place]) 05/04/2007  . HYPERCHOLESTEROLEMIA 10/13/2006  . Gout, unspecified 10/13/2006  . HYPOKALEMIA 10/13/2006  . Schizophrenia ([place]) 10/13/2006  . Depression 10/13/2006  . Essential hypertension 10/13/2006  . Venous (peripheral) insufficiency 10/13/2006  . RHINITIS, ALLERGIC 10/13/2006  . Asthma 10/13/2006  . Reflux esophagitis 10/13/2006  . OSTEOARTHRITIS, MULTI SITES 10/13/2006  . INCONTINENCE, URGE 10/13/2006    Past Surgical History:  Procedure Laterality Date  . APPENDECTOMY    . CHOLECYSTECTOMY    . COLONOSCOPY     benign polyps (12/2000), repeat colonoscopy performed in 2007  . ENDOMETRIAL BIOPSY     10/03 benign columnar mucosa (limited material)  . KIDNEY SURGERY      Current Outpatient Rx  . Order #: [id number] Class: Historical Med  . Order #: [id number] Class: Historical Med  .  Order #: [id number] Class: Historical Med  . Order #: [id number] Class: Historical Med  . Order #: [id number] Class: Historical Med  . Order #: [id number] Class: Historical Med  . Order #: [id number] Class: Historical Med  . Order #: [id number] Class: Historical Med  . Order #: [id number] Class: Historical Med  . Order #: [id number] Class: Normal  . Order #: [id number] Class: Historical Med  . Order #: [id number] Class: Historical Med  . Order #: [id number] Class: Historical Med  . Order #: [id number] Class: Historical Med    . Order #: [id number] Class: Historical Med  . Order #: [id number] Class: Historical Med  . Order #: [id number] Class: Historical Med  . Order #: [id number] Class: Historical Med  . Order #: [id number] Class: Historical Med  . Order #: [id number] Class: Historical Med  . Order #: [id number] Class: Historical Med  . Order #: [id number] Class: Historical Med  . Order #: [id number] Class: Historical Med  . Order #: [id number] Class: Historical Med  . Order #: [id number] Class: Historical Med  . Order #: [id number] Class: Historical Med  . Order #: [id number] Class: Historical Med  . Order #: [id number] Class: Print  . Order #: [id number] Class: Historical Med  . Order #: [id number] Class: Historical Med    Allergies Benztropine mesylate; Codeine; Diazepam; Diphenhydramine hcl; Penicillins; Sulfonamide derivatives; Thioridazine hcl; and Lisinopril  Family History  Problem Relation Age of Onset  . Bone cancer Mother   . Hypertension Mother   . Heart disease Mother   . COPD Father   . Heart disease Father   . Stroke Father     Social History Social History   Tobacco Use  . Smoking status: Former Smoker    Types: Cigarettes    Last attempt to quit: 06/23/1972    Years since quitting: 45.9  . Smokeless tobacco: Never Used  Substance Use Topics  . Alcohol use: No  . Drug use: No    Review of Systems  All other systems negative except as documented in the HPI. All pertinent positives and negatives as reviewed in the HPI. ____________________________________________   PHYSICAL EXAM:  VITAL SIGNS: ED Triage Vitals  Enc Vitals Group     BP 06/05/18 2007 129/63     Pulse Rate 06/05/18 2007 90     Resp 06/05/18 2007 18     Temp 06/05/18 2007 99.4 F (37.4 C)     Temp src --      SpO2 06/05/18 2007 94 %     Weight 06/05/18 2004 230 lb (104.3 kg)     Height 06/05/18 2004 5\' 2"  (1.575 m)     Head Circumference --      Peak Flow --      Pain Score 06/05/18 2004 0     Pain Loc --      Pain Edu? --       Excl. in [place]? --     Constitutional: Alert and oriented. Well appearing and in no acute distress. Eyes: Conjunctivae are normal. PERRL. EOMI. Head: Atraumatic. Nose: No congestion/rhinnorhea. Mouth/Throat: Mucous membranes are moist.  Oropharynx non-erythematous. Neck: No stridor.  No meningeal signs.   Cardiovascular: Normal rate, regular rhythm. Good peripheral circulation. Grossly normal heart sounds.   Respiratory: Normal respiratory effort.  No retractions. Lungs CTAB. Gastrointestinal: Soft and nontender. No distention.  Musculoskeletal: No lower extremity tenderness nor edema. No gross deformities of extremities. Neurologic:  Normal speech and language. No gross focal neurologic deficits are appreciated.  Skin:  Skin is warm, dry and intact. No rash noted.   ____________________________________________   LABS (all labs ordered are listed, but only  abnormal results are displayed)  Labs Reviewed  CBG MONITORING, ED - Abnormal; Notable for the following components:      Result Value   Glucose-Capillary 310 (*)    All other components within normal limits  CBG MONITORING, ED - Abnormal; Notable for the following components:   Glucose-Capillary 315 (*)    All other components within normal limits   ____________________________________________   INITIAL IMPRESSION / ASSESSMENT AND PLAN / ED COURSE  Patient overall appears well.  Blood sugar here in the low 300s.  She states she is usually in the 200s and the records reflect this as well.  She is not tachycardic or clue small breathing or other concerns for possible DKA.  We will give her some fluids and reevaluate her blood sugar and if continuing to improve we will discharge back to facility.  Discussed with supervisor at [facility] who is guardian. Explained her being asymptomatic with improved blood sugars over a couple hours and will dc back to facility for further management. No indication for further workup at this  time.   Pertinent labs & imaging results that were available during my care of the patient were reviewed by me and considered in my medical decision making (see chart for details).  ____________________________________________  FINAL CLINICAL IMPRESSION(S) / ED DIAGNOSES  Final diagnoses:  Hyperglycemia     MEDICATIONS GIVEN DURING THIS VISIT:  Medications - No data to display   NEW OUTPATIENT MEDICATIONS STARTED DURING THIS VISIT:  New Prescriptions   No medications on file    Note:  This note was prepared with assistance of Dragon voice recognition software. Occasional wrong-word or sound-a-like substitutions may have occurred due to the inherent limitations of voice recognition software.   Merrily Pew, MD 06/05/18 2228

## 2018-06-06 DIAGNOSIS — E1122 Type 2 diabetes mellitus with diabetic chronic kidney disease: Secondary | ICD-10-CM | POA: Diagnosis not present

## 2018-06-06 DIAGNOSIS — N189 Chronic kidney disease, unspecified: Secondary | ICD-10-CM | POA: Diagnosis not present

## 2018-06-06 DIAGNOSIS — N183 Chronic kidney disease, stage 3 (moderate): Secondary | ICD-10-CM | POA: Diagnosis not present

## 2018-06-06 DIAGNOSIS — Z6841 Body Mass Index (BMI) 40.0 and over, adult: Secondary | ICD-10-CM | POA: Diagnosis not present

## 2018-06-06 DIAGNOSIS — I130 Hypertensive heart and chronic kidney disease with heart failure and stage 1 through stage 4 chronic kidney disease, or unspecified chronic kidney disease: Secondary | ICD-10-CM | POA: Diagnosis not present

## 2018-06-06 DIAGNOSIS — E1142 Type 2 diabetes mellitus with diabetic polyneuropathy: Secondary | ICD-10-CM | POA: Diagnosis not present

## 2018-06-06 DIAGNOSIS — J45901 Unspecified asthma with (acute) exacerbation: Secondary | ICD-10-CM | POA: Diagnosis not present

## 2018-06-06 DIAGNOSIS — Z7984 Long term (current) use of oral hypoglycemic drugs: Secondary | ICD-10-CM | POA: Diagnosis not present

## 2018-06-06 DIAGNOSIS — Z299 Encounter for prophylactic measures, unspecified: Secondary | ICD-10-CM | POA: Diagnosis not present

## 2018-06-06 DIAGNOSIS — I2510 Atherosclerotic heart disease of native coronary artery without angina pectoris: Secondary | ICD-10-CM | POA: Diagnosis not present

## 2018-06-06 DIAGNOSIS — I10 Essential (primary) hypertension: Secondary | ICD-10-CM | POA: Diagnosis not present

## 2018-06-06 DIAGNOSIS — E1165 Type 2 diabetes mellitus with hyperglycemia: Secondary | ICD-10-CM | POA: Diagnosis not present

## 2018-06-06 DIAGNOSIS — L97509 Non-pressure chronic ulcer of other part of unspecified foot with unspecified severity: Secondary | ICD-10-CM | POA: Diagnosis not present

## 2018-06-07 DIAGNOSIS — E1142 Type 2 diabetes mellitus with diabetic polyneuropathy: Secondary | ICD-10-CM | POA: Diagnosis not present

## 2018-06-07 DIAGNOSIS — J45901 Unspecified asthma with (acute) exacerbation: Secondary | ICD-10-CM | POA: Diagnosis not present

## 2018-06-07 DIAGNOSIS — E1122 Type 2 diabetes mellitus with diabetic chronic kidney disease: Secondary | ICD-10-CM | POA: Diagnosis not present

## 2018-06-07 DIAGNOSIS — Z7984 Long term (current) use of oral hypoglycemic drugs: Secondary | ICD-10-CM | POA: Diagnosis not present

## 2018-06-07 DIAGNOSIS — I130 Hypertensive heart and chronic kidney disease with heart failure and stage 1 through stage 4 chronic kidney disease, or unspecified chronic kidney disease: Secondary | ICD-10-CM | POA: Diagnosis not present

## 2018-06-07 DIAGNOSIS — N183 Chronic kidney disease, stage 3 (moderate): Secondary | ICD-10-CM | POA: Diagnosis not present

## 2018-06-08 DIAGNOSIS — E1142 Type 2 diabetes mellitus with diabetic polyneuropathy: Secondary | ICD-10-CM | POA: Diagnosis not present

## 2018-06-08 DIAGNOSIS — J45901 Unspecified asthma with (acute) exacerbation: Secondary | ICD-10-CM | POA: Diagnosis not present

## 2018-06-08 DIAGNOSIS — I130 Hypertensive heart and chronic kidney disease with heart failure and stage 1 through stage 4 chronic kidney disease, or unspecified chronic kidney disease: Secondary | ICD-10-CM | POA: Diagnosis not present

## 2018-06-08 DIAGNOSIS — Z7984 Long term (current) use of oral hypoglycemic drugs: Secondary | ICD-10-CM | POA: Diagnosis not present

## 2018-06-08 DIAGNOSIS — E1122 Type 2 diabetes mellitus with diabetic chronic kidney disease: Secondary | ICD-10-CM | POA: Diagnosis not present

## 2018-06-08 DIAGNOSIS — N183 Chronic kidney disease, stage 3 (moderate): Secondary | ICD-10-CM | POA: Diagnosis not present

## 2018-06-11 DIAGNOSIS — I5032 Chronic diastolic (congestive) heart failure: Secondary | ICD-10-CM | POA: Diagnosis not present

## 2018-06-11 DIAGNOSIS — L97512 Non-pressure chronic ulcer of other part of right foot with fat layer exposed: Secondary | ICD-10-CM | POA: Diagnosis not present

## 2018-06-11 DIAGNOSIS — I130 Hypertensive heart and chronic kidney disease with heart failure and stage 1 through stage 4 chronic kidney disease, or unspecified chronic kidney disease: Secondary | ICD-10-CM | POA: Diagnosis not present

## 2018-06-11 DIAGNOSIS — E1122 Type 2 diabetes mellitus with diabetic chronic kidney disease: Secondary | ICD-10-CM | POA: Diagnosis not present

## 2018-06-11 DIAGNOSIS — E11621 Type 2 diabetes mellitus with foot ulcer: Secondary | ICD-10-CM | POA: Diagnosis not present

## 2018-06-11 DIAGNOSIS — N183 Chronic kidney disease, stage 3 (moderate): Secondary | ICD-10-CM | POA: Diagnosis not present

## 2018-06-12 DIAGNOSIS — L97512 Non-pressure chronic ulcer of other part of right foot with fat layer exposed: Secondary | ICD-10-CM | POA: Diagnosis not present

## 2018-06-12 DIAGNOSIS — E11621 Type 2 diabetes mellitus with foot ulcer: Secondary | ICD-10-CM | POA: Diagnosis not present

## 2018-06-12 DIAGNOSIS — N183 Chronic kidney disease, stage 3 (moderate): Secondary | ICD-10-CM | POA: Diagnosis not present

## 2018-06-12 DIAGNOSIS — I5032 Chronic diastolic (congestive) heart failure: Secondary | ICD-10-CM | POA: Diagnosis not present

## 2018-06-12 DIAGNOSIS — I130 Hypertensive heart and chronic kidney disease with heart failure and stage 1 through stage 4 chronic kidney disease, or unspecified chronic kidney disease: Secondary | ICD-10-CM | POA: Diagnosis not present

## 2018-06-12 DIAGNOSIS — E1122 Type 2 diabetes mellitus with diabetic chronic kidney disease: Secondary | ICD-10-CM | POA: Diagnosis not present

## 2018-06-15 DIAGNOSIS — N183 Chronic kidney disease, stage 3 (moderate): Secondary | ICD-10-CM | POA: Diagnosis not present

## 2018-06-15 DIAGNOSIS — E1122 Type 2 diabetes mellitus with diabetic chronic kidney disease: Secondary | ICD-10-CM | POA: Diagnosis not present

## 2018-06-15 DIAGNOSIS — I5032 Chronic diastolic (congestive) heart failure: Secondary | ICD-10-CM | POA: Diagnosis not present

## 2018-06-15 DIAGNOSIS — L97512 Non-pressure chronic ulcer of other part of right foot with fat layer exposed: Secondary | ICD-10-CM | POA: Diagnosis not present

## 2018-06-15 DIAGNOSIS — I130 Hypertensive heart and chronic kidney disease with heart failure and stage 1 through stage 4 chronic kidney disease, or unspecified chronic kidney disease: Secondary | ICD-10-CM | POA: Diagnosis not present

## 2018-06-15 DIAGNOSIS — E11621 Type 2 diabetes mellitus with foot ulcer: Secondary | ICD-10-CM | POA: Diagnosis not present

## 2018-06-16 DIAGNOSIS — L84 Corns and callosities: Secondary | ICD-10-CM | POA: Diagnosis not present

## 2018-06-16 DIAGNOSIS — L97512 Non-pressure chronic ulcer of other part of right foot with fat layer exposed: Secondary | ICD-10-CM | POA: Diagnosis not present

## 2018-06-16 DIAGNOSIS — E09621 Drug or chemical induced diabetes mellitus with foot ulcer: Secondary | ICD-10-CM | POA: Diagnosis not present

## 2018-06-16 DIAGNOSIS — E669 Obesity, unspecified: Secondary | ICD-10-CM | POA: Diagnosis not present

## 2018-06-16 DIAGNOSIS — E11621 Type 2 diabetes mellitus with foot ulcer: Secondary | ICD-10-CM | POA: Diagnosis not present

## 2018-06-16 DIAGNOSIS — L97518 Non-pressure chronic ulcer of other part of right foot with other specified severity: Secondary | ICD-10-CM | POA: Diagnosis not present

## 2018-06-19 DIAGNOSIS — I5032 Chronic diastolic (congestive) heart failure: Secondary | ICD-10-CM | POA: Diagnosis not present

## 2018-06-19 DIAGNOSIS — I130 Hypertensive heart and chronic kidney disease with heart failure and stage 1 through stage 4 chronic kidney disease, or unspecified chronic kidney disease: Secondary | ICD-10-CM | POA: Diagnosis not present

## 2018-06-19 DIAGNOSIS — E11621 Type 2 diabetes mellitus with foot ulcer: Secondary | ICD-10-CM | POA: Diagnosis not present

## 2018-06-19 DIAGNOSIS — E1122 Type 2 diabetes mellitus with diabetic chronic kidney disease: Secondary | ICD-10-CM | POA: Diagnosis not present

## 2018-06-19 DIAGNOSIS — N183 Chronic kidney disease, stage 3 (moderate): Secondary | ICD-10-CM | POA: Diagnosis not present

## 2018-06-19 DIAGNOSIS — L97512 Non-pressure chronic ulcer of other part of right foot with fat layer exposed: Secondary | ICD-10-CM | POA: Diagnosis not present

## 2018-06-20 DIAGNOSIS — N183 Chronic kidney disease, stage 3 (moderate): Secondary | ICD-10-CM | POA: Diagnosis not present

## 2018-06-20 DIAGNOSIS — L97512 Non-pressure chronic ulcer of other part of right foot with fat layer exposed: Secondary | ICD-10-CM | POA: Diagnosis not present

## 2018-06-20 DIAGNOSIS — E11621 Type 2 diabetes mellitus with foot ulcer: Secondary | ICD-10-CM | POA: Diagnosis not present

## 2018-06-20 DIAGNOSIS — E1122 Type 2 diabetes mellitus with diabetic chronic kidney disease: Secondary | ICD-10-CM | POA: Diagnosis not present

## 2018-06-20 DIAGNOSIS — I5032 Chronic diastolic (congestive) heart failure: Secondary | ICD-10-CM | POA: Diagnosis not present

## 2018-06-20 DIAGNOSIS — I130 Hypertensive heart and chronic kidney disease with heart failure and stage 1 through stage 4 chronic kidney disease, or unspecified chronic kidney disease: Secondary | ICD-10-CM | POA: Diagnosis not present

## 2018-06-21 DIAGNOSIS — L97919 Non-pressure chronic ulcer of unspecified part of right lower leg with unspecified severity: Secondary | ICD-10-CM | POA: Diagnosis not present

## 2018-06-21 DIAGNOSIS — L97519 Non-pressure chronic ulcer of other part of right foot with unspecified severity: Secondary | ICD-10-CM | POA: Diagnosis not present

## 2018-06-21 DIAGNOSIS — E11621 Type 2 diabetes mellitus with foot ulcer: Secondary | ICD-10-CM | POA: Diagnosis not present

## 2018-06-22 DIAGNOSIS — N183 Chronic kidney disease, stage 3 (moderate): Secondary | ICD-10-CM | POA: Diagnosis not present

## 2018-06-22 DIAGNOSIS — L97512 Non-pressure chronic ulcer of other part of right foot with fat layer exposed: Secondary | ICD-10-CM | POA: Diagnosis not present

## 2018-06-22 DIAGNOSIS — E1122 Type 2 diabetes mellitus with diabetic chronic kidney disease: Secondary | ICD-10-CM | POA: Diagnosis not present

## 2018-06-22 DIAGNOSIS — I130 Hypertensive heart and chronic kidney disease with heart failure and stage 1 through stage 4 chronic kidney disease, or unspecified chronic kidney disease: Secondary | ICD-10-CM | POA: Diagnosis not present

## 2018-06-22 DIAGNOSIS — I5032 Chronic diastolic (congestive) heart failure: Secondary | ICD-10-CM | POA: Diagnosis not present

## 2018-06-22 DIAGNOSIS — E11621 Type 2 diabetes mellitus with foot ulcer: Secondary | ICD-10-CM | POA: Diagnosis not present

## 2018-06-23 DIAGNOSIS — E11621 Type 2 diabetes mellitus with foot ulcer: Secondary | ICD-10-CM | POA: Diagnosis not present

## 2018-06-23 DIAGNOSIS — L84 Corns and callosities: Secondary | ICD-10-CM | POA: Diagnosis not present

## 2018-06-23 DIAGNOSIS — E669 Obesity, unspecified: Secondary | ICD-10-CM | POA: Diagnosis not present

## 2018-06-23 DIAGNOSIS — L97518 Non-pressure chronic ulcer of other part of right foot with other specified severity: Secondary | ICD-10-CM | POA: Diagnosis not present

## 2018-06-23 DIAGNOSIS — L97512 Non-pressure chronic ulcer of other part of right foot with fat layer exposed: Secondary | ICD-10-CM | POA: Diagnosis not present

## 2018-06-26 DIAGNOSIS — K5900 Constipation, unspecified: Secondary | ICD-10-CM | POA: Diagnosis not present

## 2018-06-26 DIAGNOSIS — E1165 Type 2 diabetes mellitus with hyperglycemia: Secondary | ICD-10-CM | POA: Diagnosis not present

## 2018-06-26 DIAGNOSIS — Z299 Encounter for prophylactic measures, unspecified: Secondary | ICD-10-CM | POA: Diagnosis not present

## 2018-06-26 DIAGNOSIS — M1710 Unilateral primary osteoarthritis, unspecified knee: Secondary | ICD-10-CM | POA: Diagnosis not present

## 2018-06-26 DIAGNOSIS — Z6839 Body mass index (BMI) 39.0-39.9, adult: Secondary | ICD-10-CM | POA: Diagnosis not present

## 2018-06-26 DIAGNOSIS — F209 Schizophrenia, unspecified: Secondary | ICD-10-CM | POA: Diagnosis not present

## 2018-06-26 DIAGNOSIS — I10 Essential (primary) hypertension: Secondary | ICD-10-CM | POA: Diagnosis not present

## 2018-06-27 DIAGNOSIS — N183 Chronic kidney disease, stage 3 (moderate): Secondary | ICD-10-CM | POA: Diagnosis not present

## 2018-06-27 DIAGNOSIS — L97512 Non-pressure chronic ulcer of other part of right foot with fat layer exposed: Secondary | ICD-10-CM | POA: Diagnosis not present

## 2018-06-27 DIAGNOSIS — E11621 Type 2 diabetes mellitus with foot ulcer: Secondary | ICD-10-CM | POA: Diagnosis not present

## 2018-06-27 DIAGNOSIS — E1122 Type 2 diabetes mellitus with diabetic chronic kidney disease: Secondary | ICD-10-CM | POA: Diagnosis not present

## 2018-06-27 DIAGNOSIS — I5032 Chronic diastolic (congestive) heart failure: Secondary | ICD-10-CM | POA: Diagnosis not present

## 2018-06-27 DIAGNOSIS — I130 Hypertensive heart and chronic kidney disease with heart failure and stage 1 through stage 4 chronic kidney disease, or unspecified chronic kidney disease: Secondary | ICD-10-CM | POA: Diagnosis not present

## 2018-06-27 IMAGING — CR DG CHEST 2V
2 series · 2 of 2 positions shown · non-contrast
Comparison: 10/06/2017

CLINICAL DATA: Chest pain

EXAM:
CHEST - 2 VIEW

[chest lat]
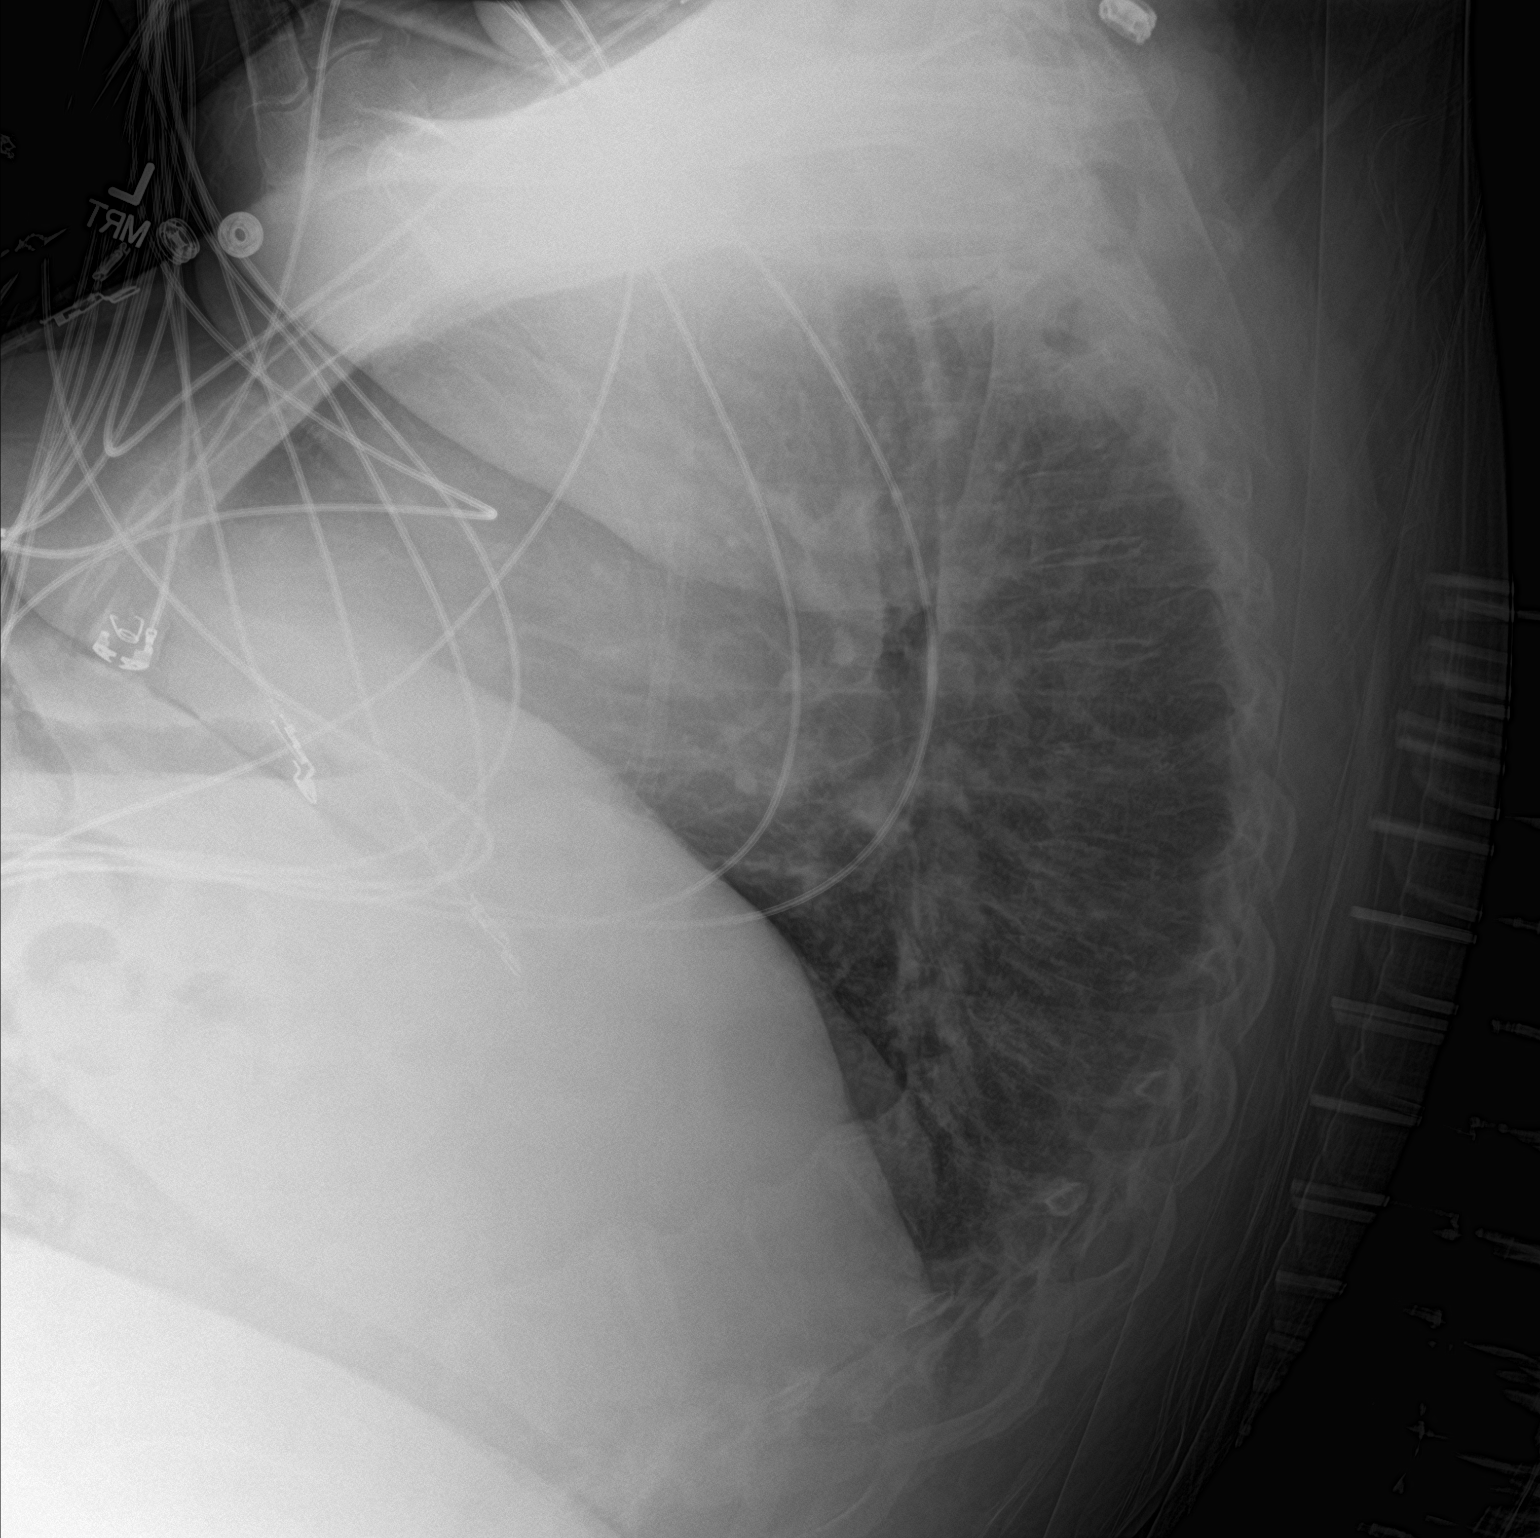

[chest ap]
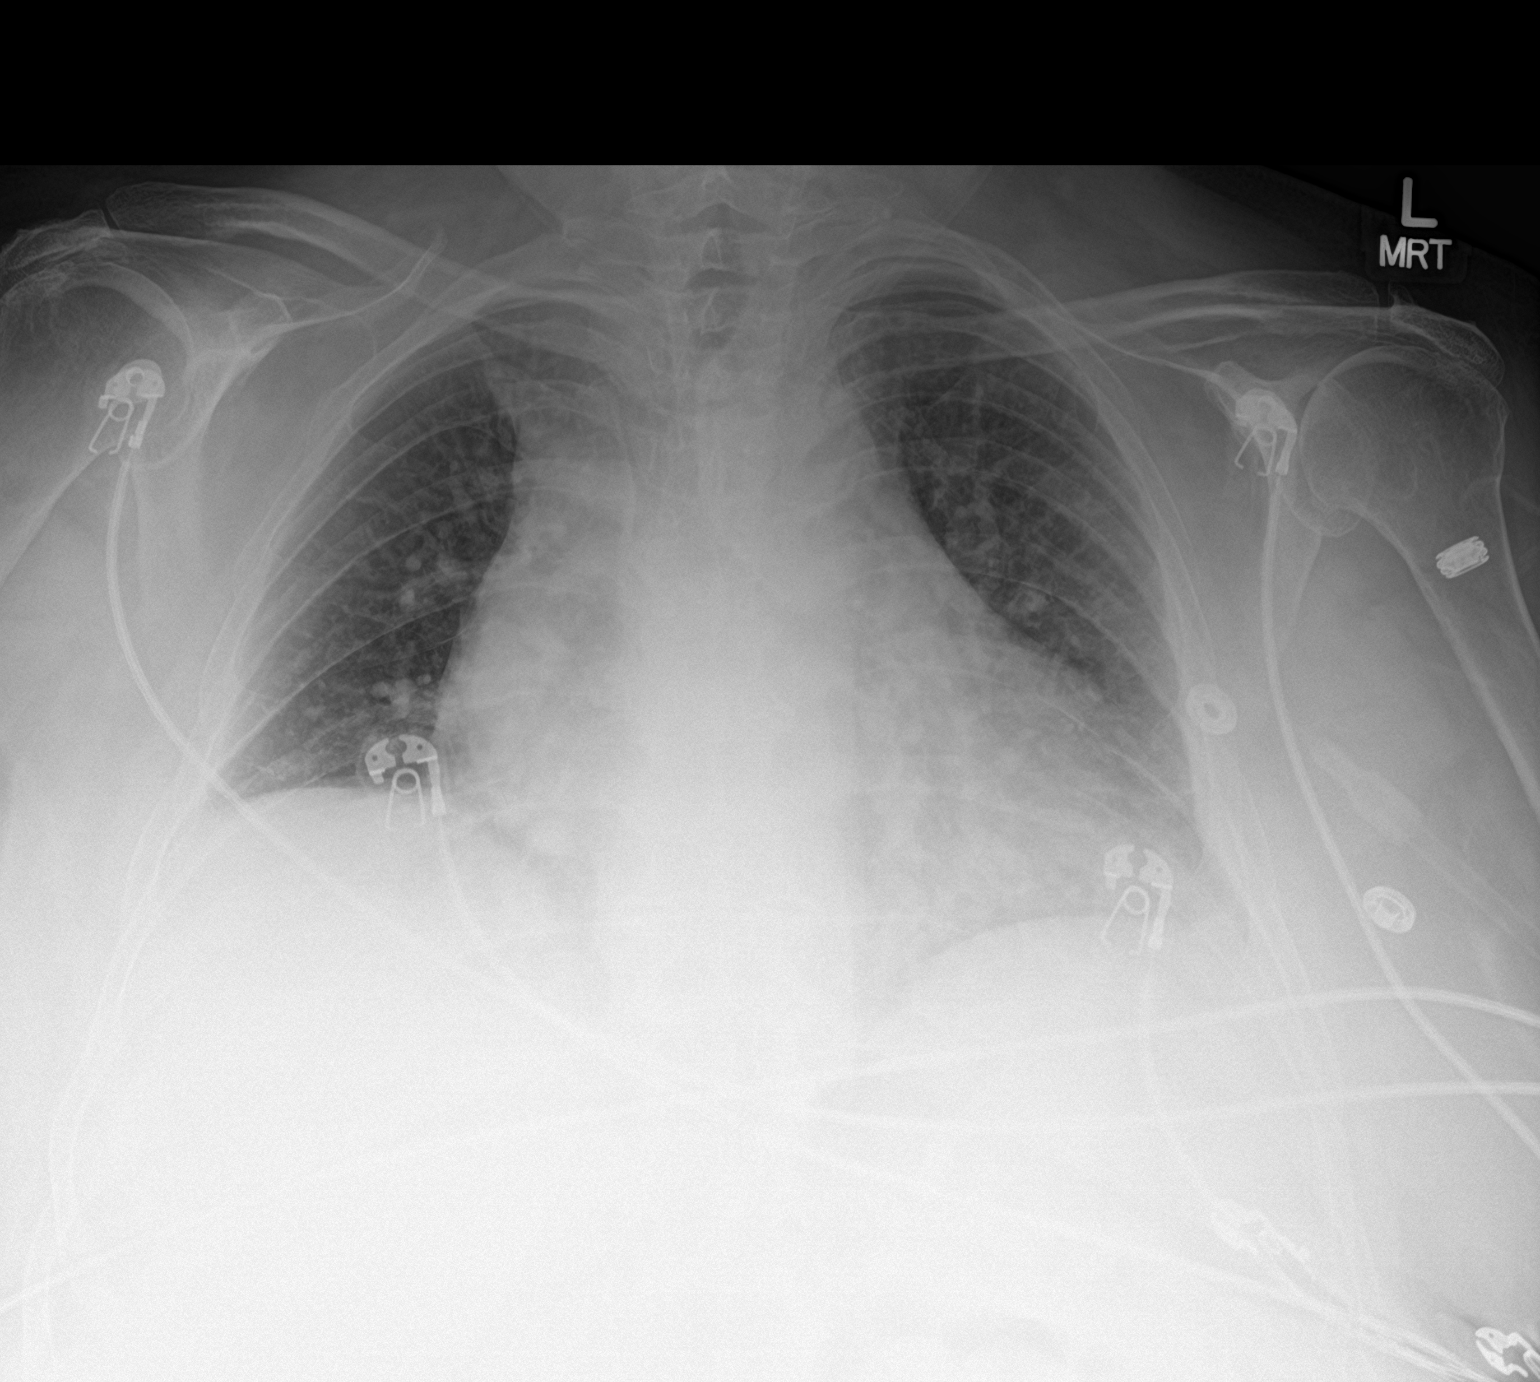

[2 of 2 positions shown; findings below may reference images not displayed]

FINDINGS: Cardiac enlargement without heart failure. Hypoventilation with
decreased lung volume. Negative for pneumonia or effusion.
IMPRESSION: No active cardiopulmonary disease.

## 2018-06-27 IMAGING — CT CT HEAD W/O CM
4 series · 16 of 47 positions shown, 18 images · non-contrast
Comparison: January 22, 2017

CLINICAL DATA: Altered mental status

EXAM:
CT HEAD WITHOUT CONTRAST
TECHNIQUE: Contiguous axial images were obtained from the base of the skull
through the vertex without intravenous contrast.

[Series 3: head wo · axial · 0.46mm/px · z∈[-103,+22]mm · 7 of 35 slices shown, 9 images]
[im 5/35  brain]
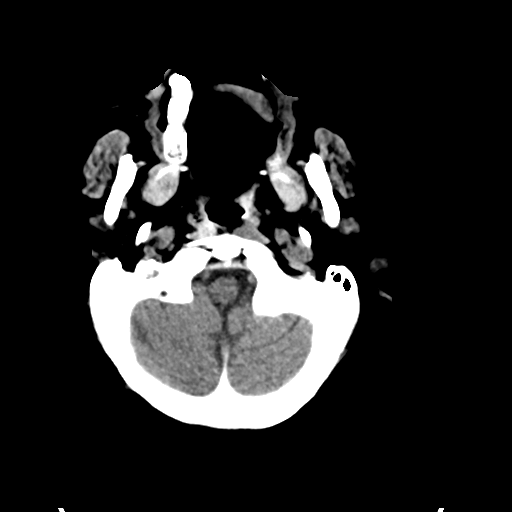
[im 5/35  bone]
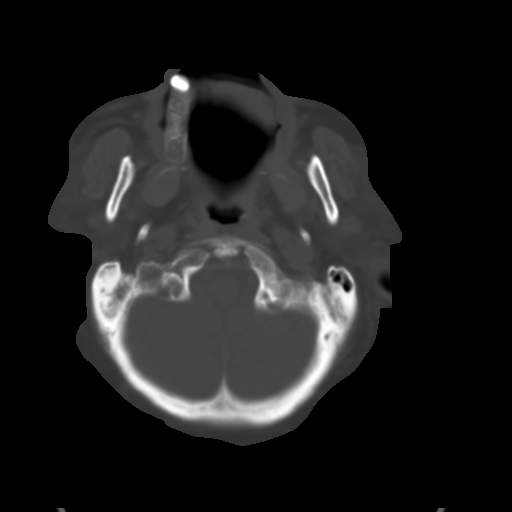
[im 9/35  brain]
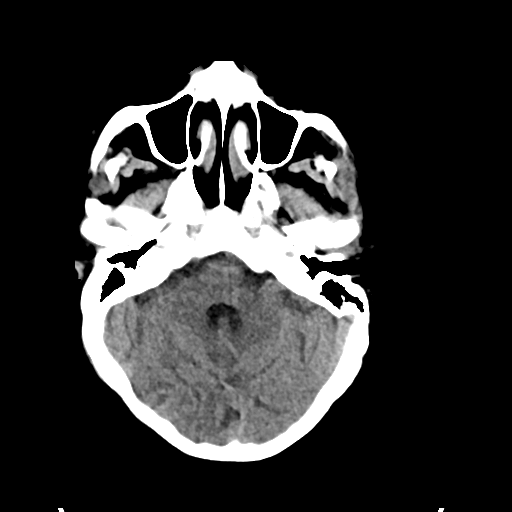
[im 13/35  brain]
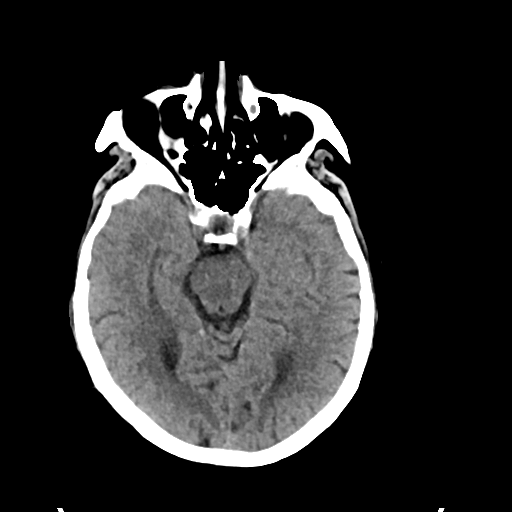
[im 18/35  brain]
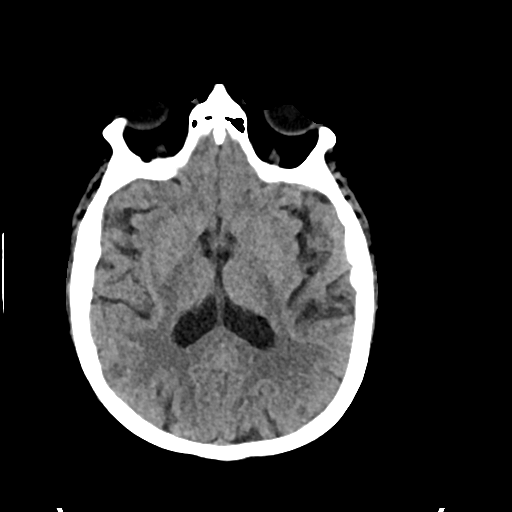
[im 22/35  brain]
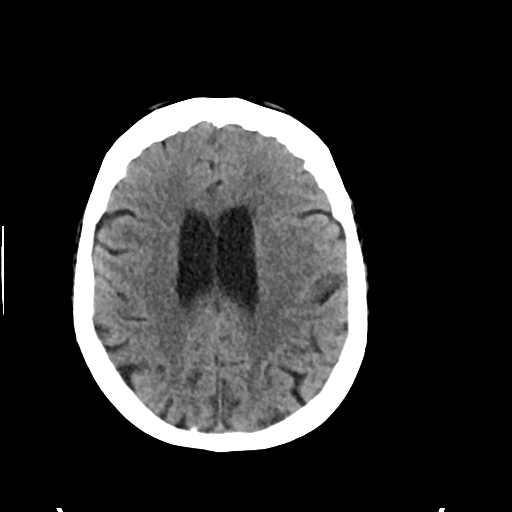
[im 22/35  bone]
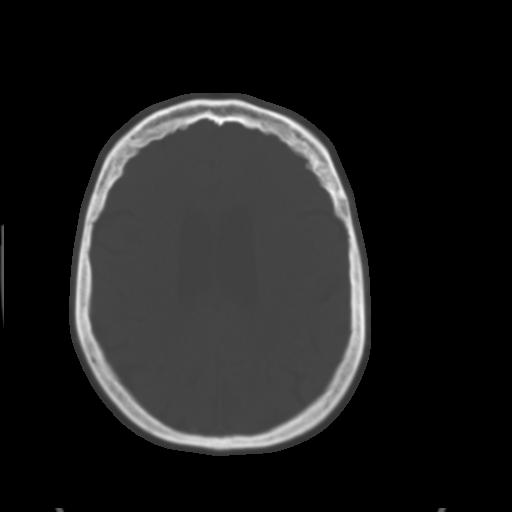
[im 26/35  brain]
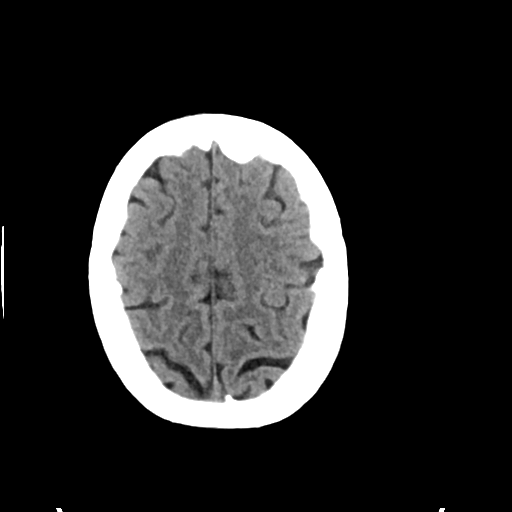
[im 30/35  brain]
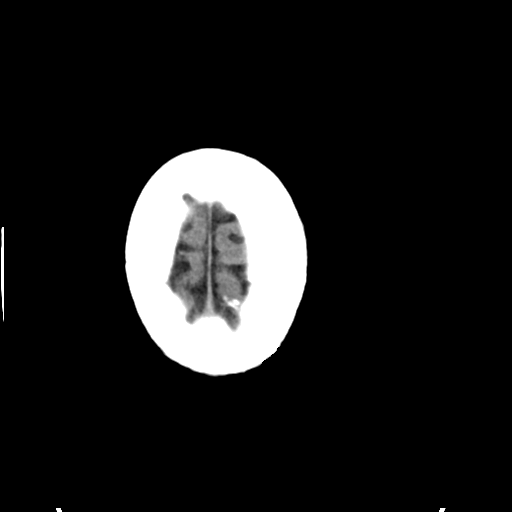

[Series 4: head bone · axial · 0.46mm/px · z∈[-107,-73]mm · 3 of 86 slices shown]
[im 9/86  bone]
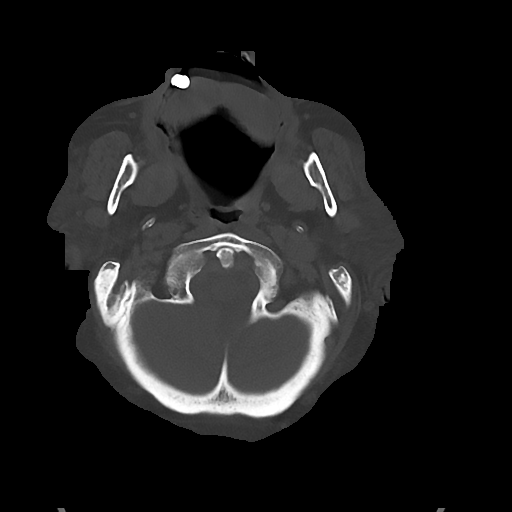
[im 18/86  bone]
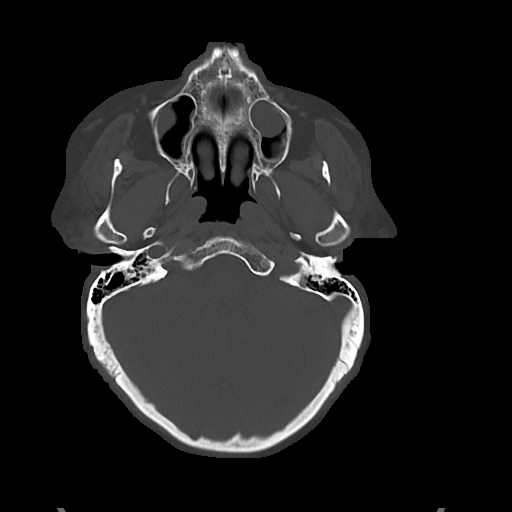
[im 26/86  bone]
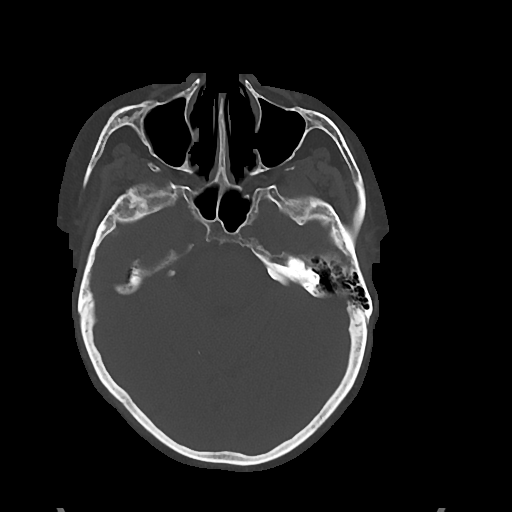

[Series 5: cor soft · coronal · 0.32mm/px · 3 of 67 slices shown]
[im 23/67  brain]
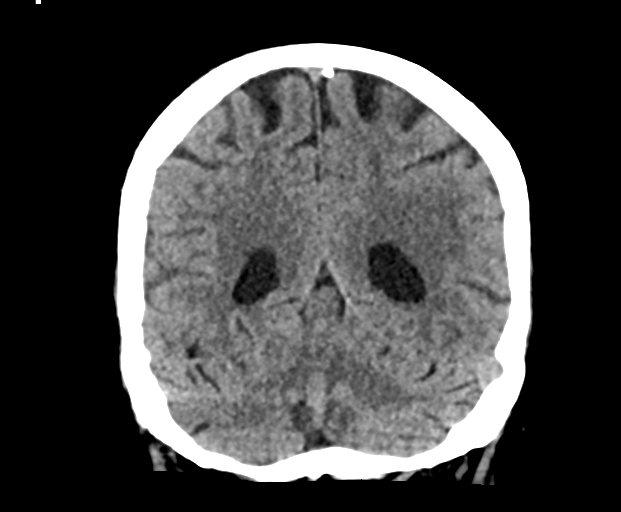
[im 30/67  brain]
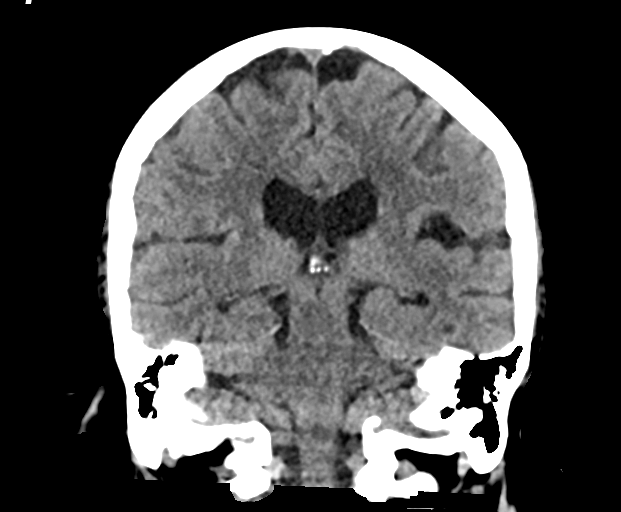
[im 37/67  brain]
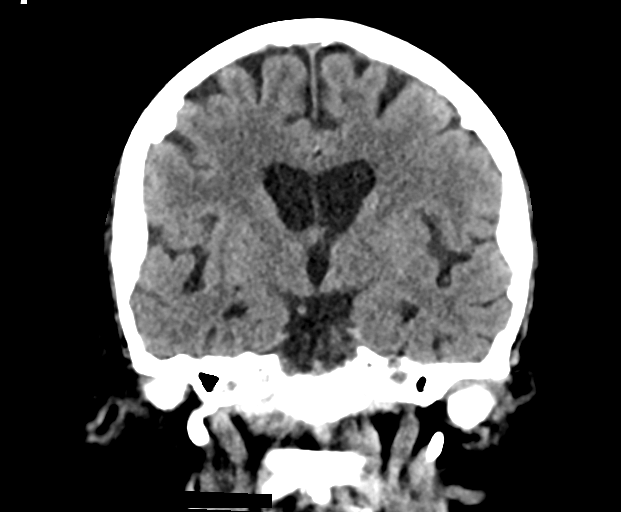

[Series 6: sag soft · sagittal · 0.33mm/px · 3 of 60 slices shown]
[im 20/60  brain]
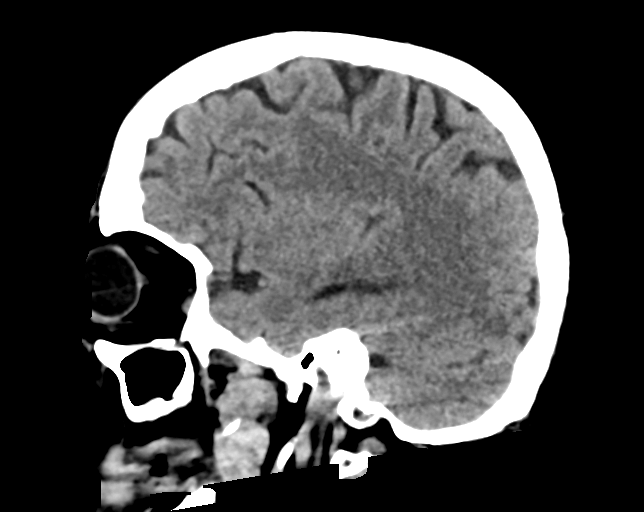
[im 30/60  brain]
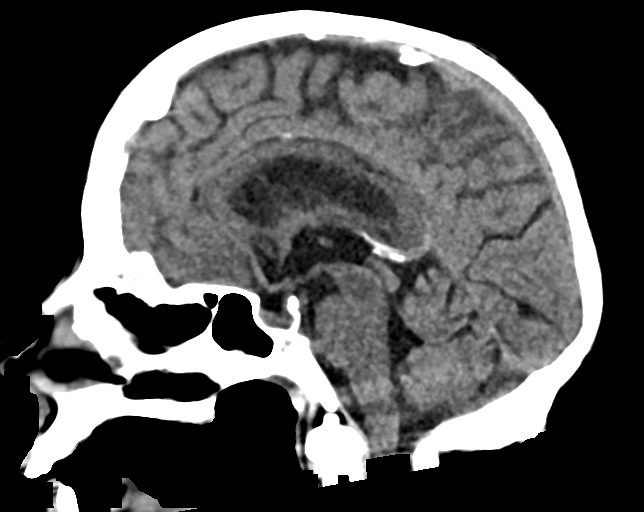
[im 40/60  brain]
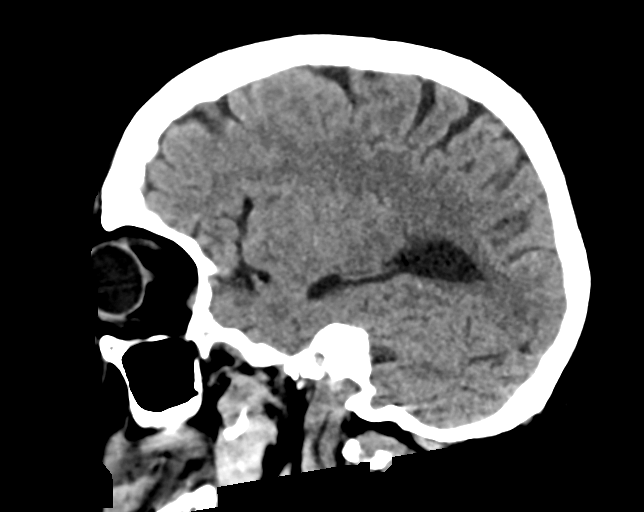

[16 of 47 positions shown; findings below may reference images not displayed]

FINDINGS: Brain: Mild atrophy is stable. There is no intracranial mass,
hemorrhage, extra-axial fluid collection, or midline shift. There is
mild small vessel disease in the centra semiovale bilaterally.
Elsewhere gray-white compartments appear normal. No acute infarct is
demonstrable.

Vascular: No appreciable hyperdense vessel. There is calcification
in each carotid siphon region.

Skull: Bony calvarium appears intact.

Sinuses/Orbits: There are retention cysts in each inferior maxillary
antrum. There is mucosal thickening in several ethmoid air cells
bilaterally. Frontal sinuses are aplastic. Orbits appear symmetric
bilaterally.

Other: Mastoid air cells are clear.
IMPRESSION: Stable atrophy with mild periventricular small vessel disease. No
evident acute infarct. No mass or hemorrhage.

Foci of arterial vascular calcification noted. There are areas of
paranasal sinus disease.

## 2018-06-27 IMAGING — CR DG CHEST 2V
2 series · 2 of 2 positions shown · non-contrast
Comparison: January 12, 2017

CLINICAL DATA: Chest pain

EXAM:
CHEST - 2 VIEW

[chest lat]
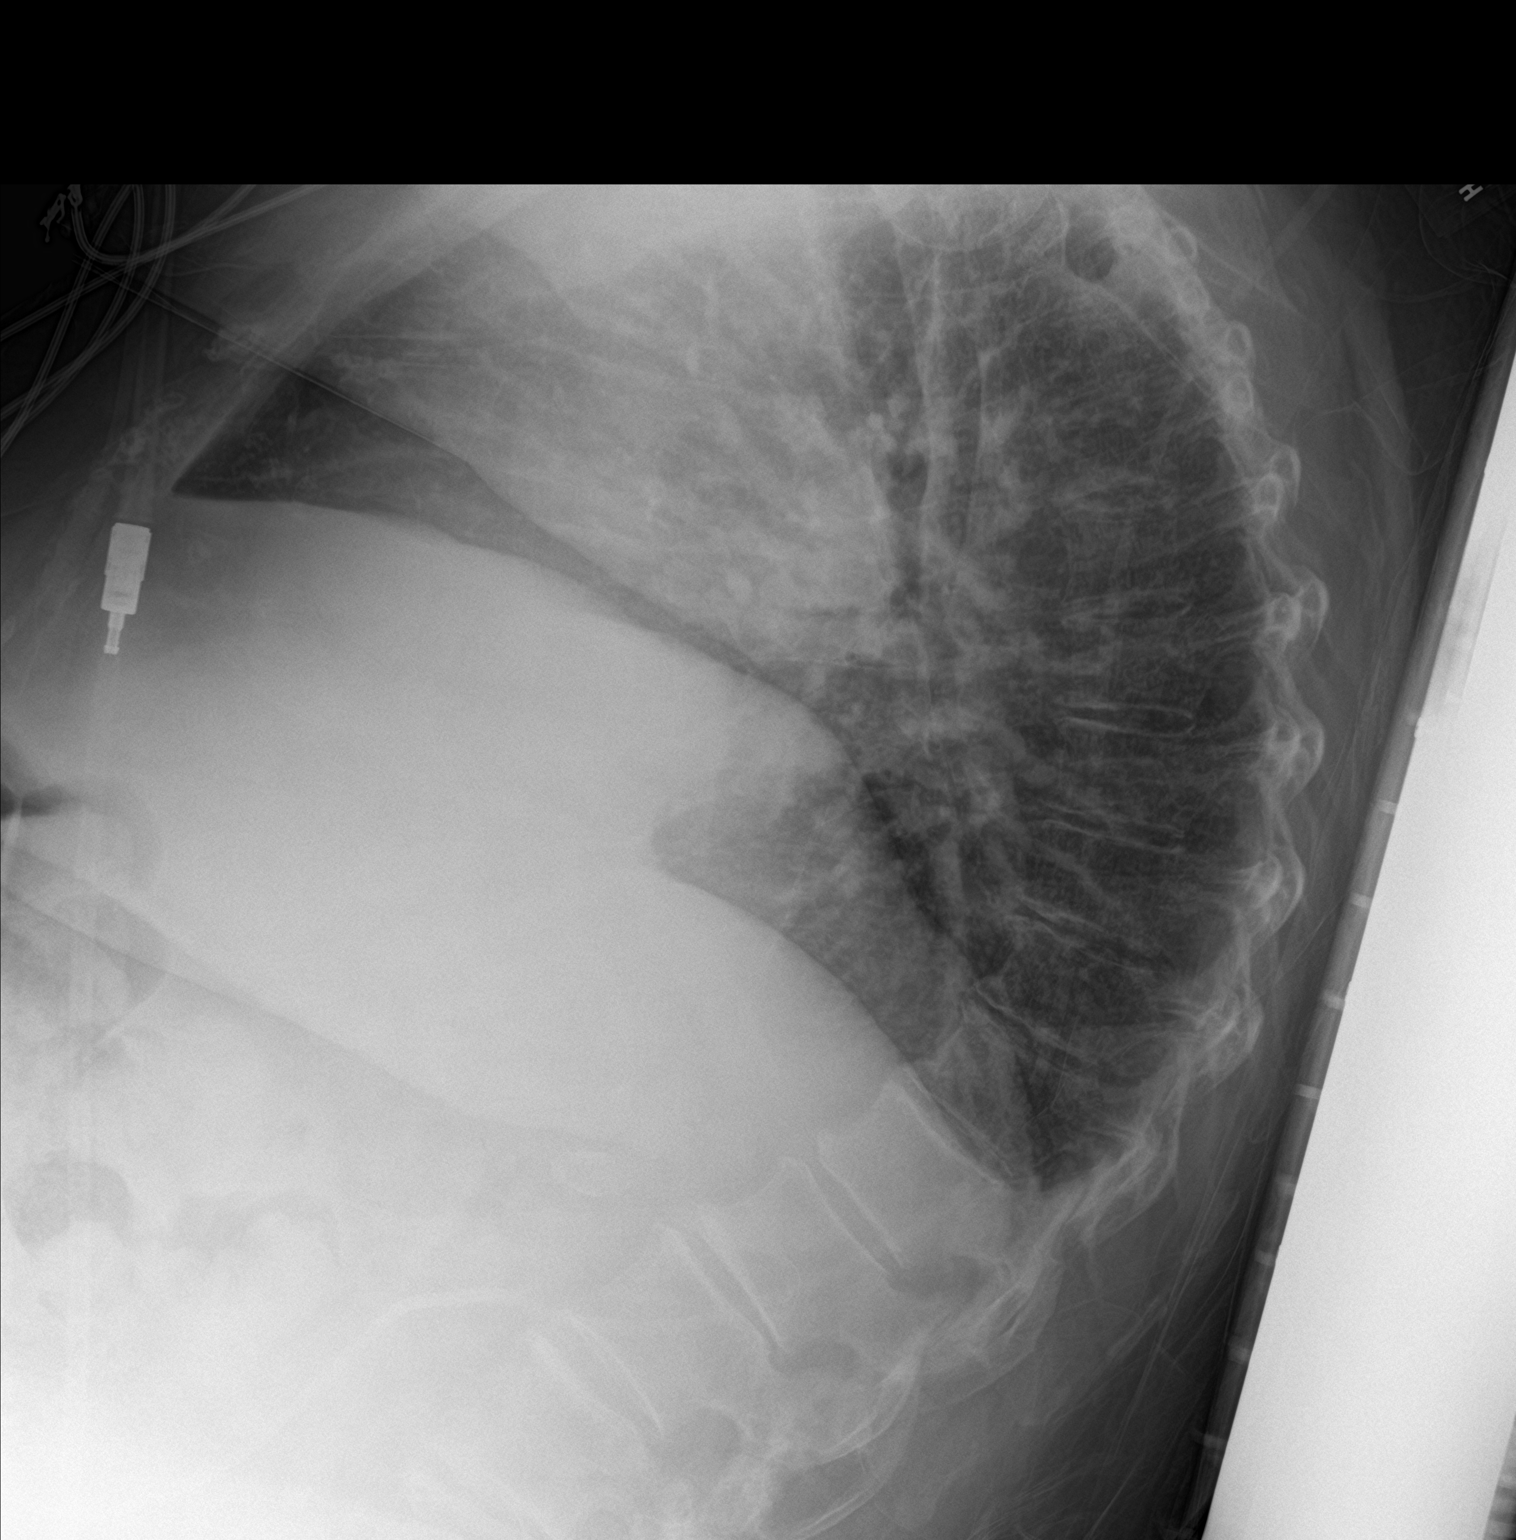

[chest ap]
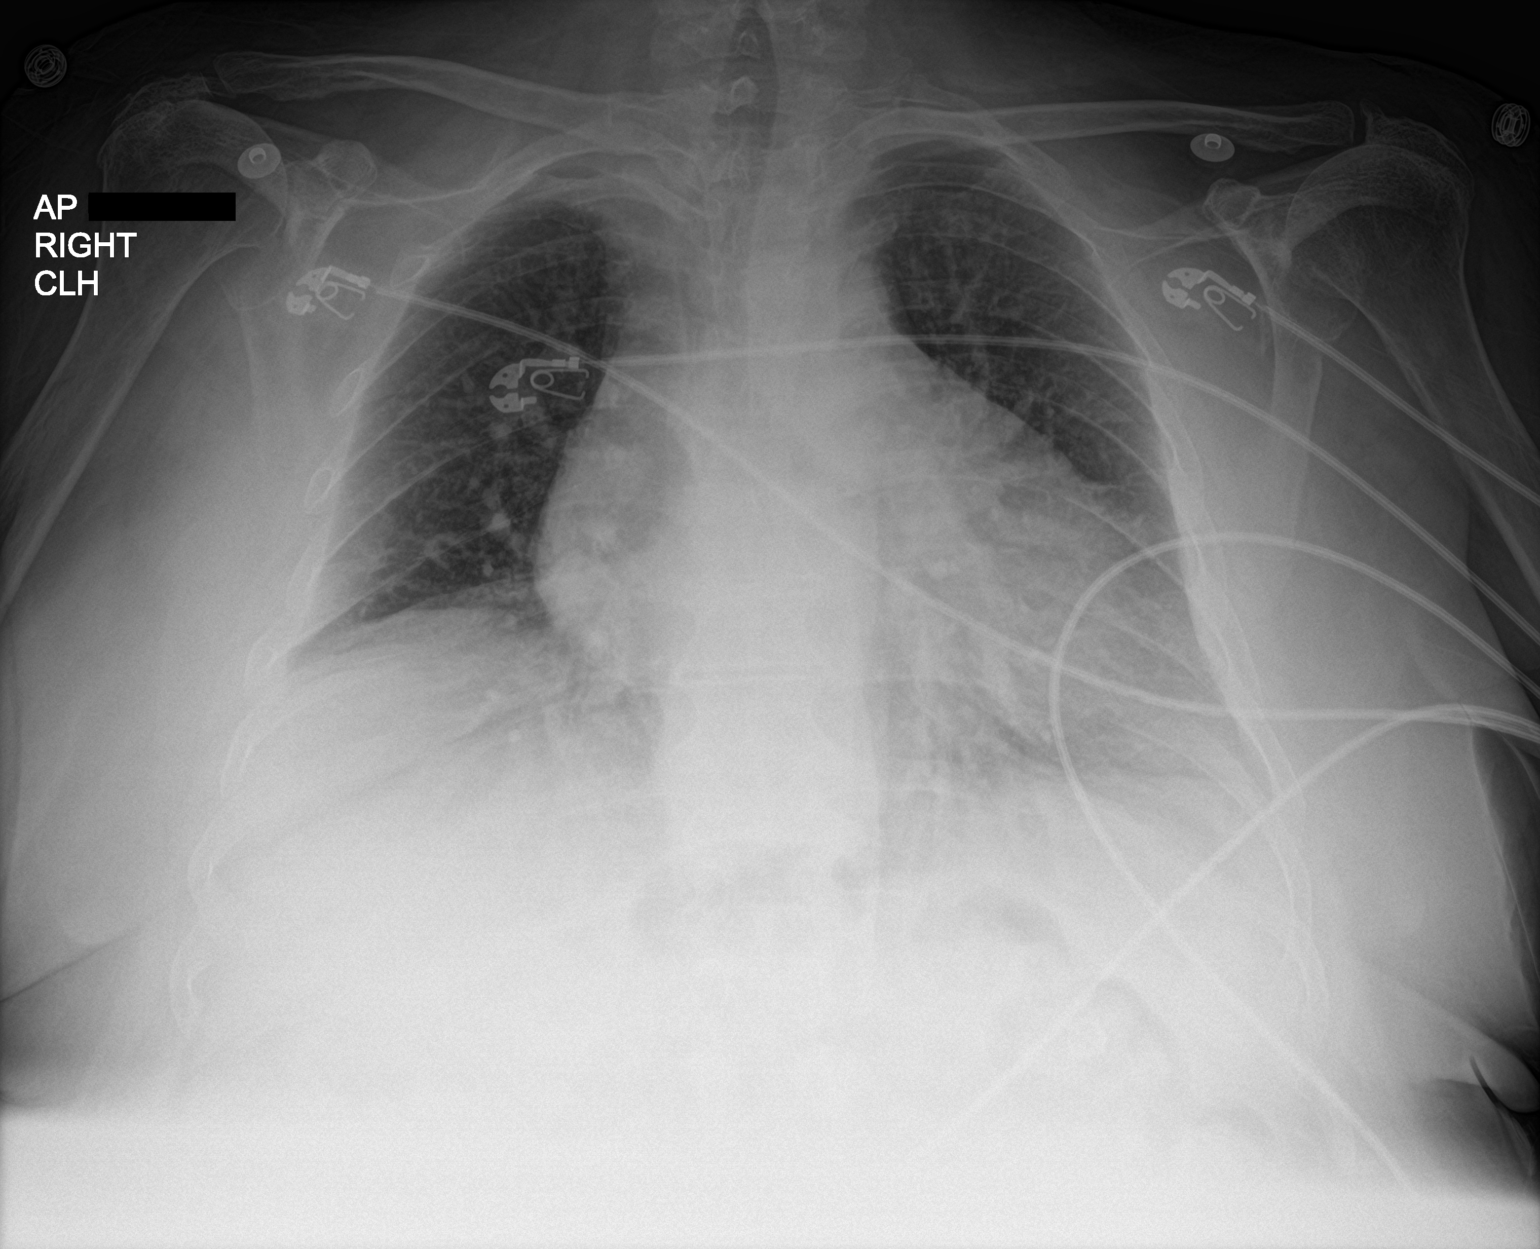

[2 of 2 positions shown; findings below may reference images not displayed]

FINDINGS: Stable cardiomegaly. The hila and mediastinum are normal. No
pulmonary nodules, masses, or focal infiltrates. No overt edema.
IMPRESSION: No active cardiopulmonary disease.

## 2018-06-29 DIAGNOSIS — E11621 Type 2 diabetes mellitus with foot ulcer: Secondary | ICD-10-CM | POA: Diagnosis not present

## 2018-06-29 DIAGNOSIS — E1122 Type 2 diabetes mellitus with diabetic chronic kidney disease: Secondary | ICD-10-CM | POA: Diagnosis not present

## 2018-06-29 DIAGNOSIS — N183 Chronic kidney disease, stage 3 (moderate): Secondary | ICD-10-CM | POA: Diagnosis not present

## 2018-06-29 DIAGNOSIS — I130 Hypertensive heart and chronic kidney disease with heart failure and stage 1 through stage 4 chronic kidney disease, or unspecified chronic kidney disease: Secondary | ICD-10-CM | POA: Diagnosis not present

## 2018-06-29 DIAGNOSIS — L97512 Non-pressure chronic ulcer of other part of right foot with fat layer exposed: Secondary | ICD-10-CM | POA: Diagnosis not present

## 2018-06-29 DIAGNOSIS — I5032 Chronic diastolic (congestive) heart failure: Secondary | ICD-10-CM | POA: Diagnosis not present

## 2018-06-30 DIAGNOSIS — E669 Obesity, unspecified: Secondary | ICD-10-CM | POA: Diagnosis not present

## 2018-06-30 DIAGNOSIS — L84 Corns and callosities: Secondary | ICD-10-CM | POA: Diagnosis not present

## 2018-06-30 DIAGNOSIS — L97513 Non-pressure chronic ulcer of other part of right foot with necrosis of muscle: Secondary | ICD-10-CM | POA: Diagnosis not present

## 2018-06-30 DIAGNOSIS — E11621 Type 2 diabetes mellitus with foot ulcer: Secondary | ICD-10-CM | POA: Diagnosis not present

## 2018-06-30 DIAGNOSIS — L97518 Non-pressure chronic ulcer of other part of right foot with other specified severity: Secondary | ICD-10-CM | POA: Diagnosis not present

## 2018-07-01 DIAGNOSIS — I130 Hypertensive heart and chronic kidney disease with heart failure and stage 1 through stage 4 chronic kidney disease, or unspecified chronic kidney disease: Secondary | ICD-10-CM | POA: Diagnosis not present

## 2018-07-01 DIAGNOSIS — L97512 Non-pressure chronic ulcer of other part of right foot with fat layer exposed: Secondary | ICD-10-CM | POA: Diagnosis not present

## 2018-07-01 DIAGNOSIS — I5032 Chronic diastolic (congestive) heart failure: Secondary | ICD-10-CM | POA: Diagnosis not present

## 2018-07-01 DIAGNOSIS — E1122 Type 2 diabetes mellitus with diabetic chronic kidney disease: Secondary | ICD-10-CM | POA: Diagnosis not present

## 2018-07-01 DIAGNOSIS — N183 Chronic kidney disease, stage 3 (moderate): Secondary | ICD-10-CM | POA: Diagnosis not present

## 2018-07-01 DIAGNOSIS — E11621 Type 2 diabetes mellitus with foot ulcer: Secondary | ICD-10-CM | POA: Diagnosis not present

## 2018-07-03 DIAGNOSIS — N183 Chronic kidney disease, stage 3 (moderate): Secondary | ICD-10-CM | POA: Diagnosis not present

## 2018-07-03 DIAGNOSIS — L97512 Non-pressure chronic ulcer of other part of right foot with fat layer exposed: Secondary | ICD-10-CM | POA: Diagnosis not present

## 2018-07-03 DIAGNOSIS — E1122 Type 2 diabetes mellitus with diabetic chronic kidney disease: Secondary | ICD-10-CM | POA: Diagnosis not present

## 2018-07-03 DIAGNOSIS — I5032 Chronic diastolic (congestive) heart failure: Secondary | ICD-10-CM | POA: Diagnosis not present

## 2018-07-03 DIAGNOSIS — E11621 Type 2 diabetes mellitus with foot ulcer: Secondary | ICD-10-CM | POA: Diagnosis not present

## 2018-07-03 DIAGNOSIS — I130 Hypertensive heart and chronic kidney disease with heart failure and stage 1 through stage 4 chronic kidney disease, or unspecified chronic kidney disease: Secondary | ICD-10-CM | POA: Diagnosis not present

## 2018-07-04 ENCOUNTER — Ambulatory Visit: Payer: Medicare Other | Admitting: Student

## 2018-07-04 DIAGNOSIS — I5032 Chronic diastolic (congestive) heart failure: Secondary | ICD-10-CM | POA: Diagnosis not present

## 2018-07-04 DIAGNOSIS — L97512 Non-pressure chronic ulcer of other part of right foot with fat layer exposed: Secondary | ICD-10-CM | POA: Diagnosis not present

## 2018-07-04 DIAGNOSIS — E1122 Type 2 diabetes mellitus with diabetic chronic kidney disease: Secondary | ICD-10-CM | POA: Diagnosis not present

## 2018-07-04 DIAGNOSIS — E11621 Type 2 diabetes mellitus with foot ulcer: Secondary | ICD-10-CM | POA: Diagnosis not present

## 2018-07-04 DIAGNOSIS — I130 Hypertensive heart and chronic kidney disease with heart failure and stage 1 through stage 4 chronic kidney disease, or unspecified chronic kidney disease: Secondary | ICD-10-CM | POA: Diagnosis not present

## 2018-07-04 DIAGNOSIS — N183 Chronic kidney disease, stage 3 (moderate): Secondary | ICD-10-CM | POA: Diagnosis not present

## 2018-07-04 NOTE — Progress Notes (Signed)
Cardiology Office Note    Date:  07/05/2018   ID:  [name], DOB [birth year], MRN [id number]  PCP:  Madelyn Brunner, MD  Cardiologist: Rozann Lesches, MD    Chief Complaint  Patient presents with  . Follow-up    3 month visit    History of Present Illness:    [name] is a 67 y.o. female with past medical history of chronic diastolic CHF, HTN, HLD, COPD, Schizophrenia and Stage 3-4 CKD who presents to the office today for 36-month follow-up.  She was last examined by Dr. Domenic Polite in 03/2018 and had recently been admitted for atypical chest discomfort and denied any repeat pain at the time of her visit. She was continued on her current medication regimen including Torsemide, Aldactone, Losartan, and Coreg.  In the interim, she was admitted to [facility] in 04/2018 for evaluation of altered mental status. MRI of the brain showed no acute intracranial abnormalities and lab work was without acute findings. EEG was also normal and she was evaluated by Psychiatry during admission as it was thought there might have been a psychogenic cause to her presenting symptoms. No changes were made to her cardiac medications during admission.   In talking with the patient today, she reports overall doing well from a cardiac perspective since her last office visit. She does report having lower extremity edema but says this has overall been unchanged. She has been going to the wound clinic regularly in regards to a nonhealing wound along her right foot.  Continues to work with PT 3 times per week while residing at [facility] but reports ambulation has been limited due to the pain along her foot. She does have a boot in place today.  Denies any recent chest pain, palpitations, dyspnea on exertion, orthopnea, or PND. She does use a CPAP on a nightly basis.   Past Medical History:  Diagnosis Date  . Anxiety   . Asthma   . Back pain   . Barrett esophagus   . CHF (congestive heart failure)  ([place])   . CKD (chronic kidney disease) stage 3, GFR 30-59 ml/min (HCC)   . COPD (chronic obstructive pulmonary disease) ([place])   . Depression   . Diastolic heart failure ([place])   . Essential hypertension   . Fibromyalgia   . GERD (gastroesophageal reflux disease)   . Gout   . History of pericarditis   . Hyperlipidemia   . Hypothyroid   . Major depressive disorder   . Memory loss   . Nephrolithiasis    2008  . Obesity hypoventilation syndrome (HCC)    CPAP  . Obstructive sleep apnea   . PE (pulmonary embolism)    2010  . Peripheral neuropathy   . PTSD (post-traumatic stress disorder)   . Rheumatoid arthritis ([place])   . Schizophrenia ([place])   . Steroid dependence   . Type 2 diabetes mellitus ([place])   . Vitamin D deficiency     Past Surgical History:  Procedure Laterality Date  . APPENDECTOMY    . CHOLECYSTECTOMY    . COLONOSCOPY     benign polyps (12/2000), repeat colonoscopy performed in 2007  . ENDOMETRIAL BIOPSY     10/03 benign columnar mucosa (limited material)  . KIDNEY SURGERY      Current Medications: Outpatient Medications Prior to Visit  Medication Sig Dispense Refill  . acetaminophen (TYLENOL) 325 MG tablet Take 325 mg by mouth 2 (two) times daily.     [name] Kitchen albuterol (PROVENTIL  HFA;VENTOLIN HFA) 108 (90 Base) MCG/ACT inhaler Inhale 2 puffs into the lungs every 6 (six) hours as needed for wheezing or shortness of breath.    [name] Kitchen albuterol (PROVENTIL) (2.5 MG/3ML) 0.083% nebulizer solution Take 2.5 mg by nebulization every 6 (six) hours as needed for wheezing or shortness of breath.    [name] Kitchen aspirin 81 MG tablet Take 81 mg by mouth daily.    [name] Kitchen atorvastatin (LIPITOR) 40 MG tablet Take 40 mg by mouth daily.     . budesonide-formoterol (SYMBICORT) 160-4.5 MCG/ACT inhaler Inhale 2 puffs into the lungs 2 (two) times daily.    . carvedilol (COREG) 25 MG tablet Take 25 mg by mouth 2 (two) times daily with a meal.    . Cholecalciferol (VITAMIN D) 2000 units CAPS Take 4,000 Units by  mouth daily.     [name] Kitchen docusate sodium (COLACE) 100 MG capsule Take 100 mg by mouth daily.     [name] Kitchen doxycycline (VIBRAMYCIN) 100 MG capsule Take 1 capsule (100 mg total) by mouth 2 (two) times daily. 20 capsule 0  . DULoxetine (CYMBALTA) 60 MG capsule Take 60 mg by mouth daily.    [name] Kitchen EPINEPHrine (EPI-PEN) 0.3 mg/0.3 mL DEVI Inject 0.3 mg into the muscle once.    . gabapentin (NEURONTIN) 300 MG capsule Take 300-600 mg by mouth See admin instructions. 300mg  twice daily and 600mg  at bedtime    . glipiZIDE (GLUCOTROL XL) 5 MG 24 hr tablet Take 5 mg by mouth daily with breakfast.     . hydrocortisone 2.5 % ointment Apply 1 application topically daily as needed (itchy skin).    . hydrOXYzine (VISTARIL) 25 MG capsule Take 25 mg by mouth 2 (two) times daily as needed for anxiety.     [name] Kitchen levothyroxine (SYNTHROID, LEVOTHROID) 25 MCG tablet Take 25 mcg by mouth daily before breakfast.    . linagliptin (TRADJENTA) 5 MG TABS tablet Take 5 mg by mouth daily.    [name] Kitchen loperamide (IMODIUM) 2 MG capsule Take 2 mg by mouth every 8 (eight) hours as needed for diarrhea or loose stools.     [name] Kitchen LORazepam (ATIVAN) 1 MG tablet Take 1 mg by mouth daily as needed for anxiety.     [name] Kitchen losartan (COZAAR) 100 MG tablet Take 100 mg by mouth daily.    . montelukast (SINGULAIR) 10 MG tablet Take 10 mg by mouth at bedtime.    . Multiple Vitamin (DAILY VITE PO) Take 1 tablet by mouth daily.    [name] Kitchen omeprazole (PRILOSEC) 20 MG capsule Take 20 mg by mouth daily.     . prazosin (MINIPRESS) 2 MG capsule Take 2 mg by mouth at bedtime. For PTSD associated nightmares    . pyridOXINE (VITAMIN B-6) 100 MG tablet Take 200 mg by mouth 2 (two) times daily.    . risperiDONE (RISPERDAL) 0.5 MG tablet Take 0.5 mg by mouth 2 (two) times daily.     [name] Kitchen spironolactone (ALDACTONE) 25 MG tablet Take 1 tablet (25 mg total) by mouth daily. 30 tablet 0  . torsemide (DEMADEX) 10 MG tablet Take 40 mg by mouth daily.     . traZODone (DESYREL) 100 MG tablet Take 100 mg by  mouth at bedtime as needed for sleep.      No facility-administered medications prior to visit.      Allergies:   Benztropine mesylate; Codeine; Diazepam; Diphenhydramine hcl; Penicillins; Sulfonamide derivatives; Thioridazine hcl; and Lisinopril   Social History   Socioeconomic History  . Marital status: Single  Spouse name: Not on file  . Number of children: Not on file  . Years of education: Not on file  . Highest education level: Not on file  Occupational History  . Not on file  Social Needs  . Financial resource strain: Not on file  . Food insecurity:    Worry: Not on file    Inability: Not on file  . Transportation needs:    Medical: Not on file    Non-medical: Not on file  Tobacco Use  . Smoking status: Former Smoker    Types: Cigarettes    Last attempt to quit: 06/23/1972    Years since quitting: 46.0  . Smokeless tobacco: Never Used  Substance and Sexual Activity  . Alcohol use: No  . Drug use: No  . Sexual activity: Never  Lifestyle  . Physical activity:    Days per week: Not on file    Minutes per session: Not on file  . Stress: Not on file  Relationships  . Social connections:    Talks on phone: Not on file    Gets together: Not on file    Attends religious service: Not on file    Active member of club or organization: Not on file    Attends meetings of clubs or organizations: Not on file    Relationship status: Not on file  Other Topics Concern  . Not on file  Social History Narrative  . Not on file     Family History:  The patient's family history includes Bone cancer in her mother; COPD in her father; Heart disease in her father and mother; Hypertension in her mother; Stroke in her father.   Review of Systems:   Please see the history of present illness.     General:  No chills, fever, night sweats or weight changes.  Cardiovascular:  No chest pain, dyspnea on exertion, orthopnea, palpitations, paroxysmal nocturnal dyspnea. Positive for  lower extremity edema.  Dermatological: No rash, lesions/masses Respiratory: No cough, dyspnea Urologic: No hematuria, dysuria Abdominal:   No nausea, vomiting, diarrhea, bright red blood per rectum, melena, or hematemesis Neurologic:  No visual changes, wkns, changes in mental status.  All other systems reviewed and are otherwise negative except as noted above.   Physical Exam:    VS:  BP 138/86   Pulse 87   Ht 5' (1.524 m)   Wt 219 lb (99.3 kg)   SpO2 99%   BMI 42.77 kg/m    General: Well developed, well nourished Caucasian female appearing in no acute distress. Head: Normocephalic, atraumatic, sclera non-icteric, no xanthomas, nares are without discharge.  Neck: No carotid bruits. JVD not elevated.  Lungs: Respirations regular and unlabored, without wheezes or rales.  Heart: Regular rate and rhythm. No S3 or S4.  No murmur, no rubs, or gallops appreciated. Abdomen: Soft, non-tender, non-distended with normoactive bowel sounds. No hepatomegaly. No rebound/guarding. No obvious abdominal masses. Msk:  Strength and tone appear normal for age. No joint deformities or effusions. Extremities: No clubbing or cyanosis. Trace lower extremity edema.  Distal pedal pulses are 2+ bilaterally. Boot in place along right foot.  Neuro: Alert and oriented X 3. Moves all extremities spontaneously. No focal deficits noted. Psych:  Responds to questions appropriately with a normal affect. Skin: No rashes or lesions noted  Wt Readings from Last 3 Encounters:  07/05/18 219 lb (99.3 kg)  06/05/18 230 lb (104.3 kg)  06/03/18 231 lb (104.8 kg)     Studies/Labs Reviewed:  EKG:  EKG is not ordered today.    Recent Labs: 01/12/2018: B Natriuretic Peptide 145.3 04/18/2018: TSH 3.747 06/01/2018: ALT 23 06/03/2018: BUN 36; Creatinine, Ser 2.35; Hemoglobin 8.6; Platelets 189; Potassium 4.2; Sodium 139   Lipid Panel    Component Value Date/Time   CHOL 160 11/11/2015 0239   TRIG 192 (H) 11/11/2015  0239   HDL 35 (L) 11/11/2015 0239   CHOLHDL 4.6 11/11/2015 0239   VLDL 38 11/11/2015 0239   LDLCALC 87 11/11/2015 0239   LDLDIRECT 96 02/28/2008 2044    Additional studies/ records that were reviewed today include:   Echocardiogram: 01/12/2018 Study Conclusions  - Left ventricle: The cavity size was normal. There was mild focal   basal hypertrophy of the septum. Systolic function was normal.   The estimated ejection fraction was in the range of 60% to 65%.   Wall motion was normal; there were no regional wall motion   abnormalities. Doppler parameters are consistent with abnormal   left ventricular relaxation (grade 1 diastolic dysfunction). - Aortic valve: Transvalvular velocity was within the normal range.   There was no stenosis. There was no regurgitation. - Mitral valve: Transvalvular velocity was within the normal range.   There was no evidence for stenosis. There was trivial   regurgitation. - Left atrium: The atrium was moderately dilated. - Right ventricle: The cavity size was normal. Wall thickness was   normal. Systolic function was normal. - Atrial septum: No defect or patent foramen ovale was identified. - Tricuspid valve: There was mild regurgitation. - Pulmonary arteries: Systolic pressure was within the normal   range. PA peak pressure: 36 mm Hg (S). - Pericardium, extracardiac: A small pericardial effusion was   identified. There was a left pleural effusion.  Assessment:    1. Chronic diastolic heart failure ([place])   2. Essential hypertension   3. Mixed hyperlipidemia   4. OSA (obstructive sleep apnea)   5. CKD (chronic kidney disease) stage 4, GFR 15-29 ml/min (HCC)      Plan:   In order of problems listed above:  1. Chronic Diastolic CHF - She does have trace edema on examination but lungs are clear. Reports that weight has overall been stable when checked at her ALF and she reports compliance with her current medication regimen.  Hesitant to  further titrate her baseline Torsemide dose given her Stage IV CKD, therefore will continue on Torsemide 40 mg daily with instructions to take an extra tablet for weight gain greater than 3 pounds overnight or 5 pounds in 1 week. She does not have a recliner in her room but I have asked the staff member who is with her today to check and see if there is a possible recliner or ottoman in the facility that she can elevate her legs on. Sodium and fluid restriction reviewed.   2. HTN - BP is overall well-controlled at 138/86 during today's visit. Continue Carvedilol 25 mg twice daily, Losartan 100 mg daily, and Spironolactone 25 mg daily. Will defer continuation of Spironolactone and Losartan to Nephrology given her Stage 4 CKD.   3. HLD - Followed by PCP.  Remains on Atorvastatin 40 mg daily.  4. OSA - continued compliance with CPAP encouraged.  5. Stage 4 CKD - followed by Dr. Lowanda Foster.  Creatinine was overall stable at 2.35 when most recently checked on 06/03/2018.   Medication Adjustments/Labs and Tests Ordered: Current medicines are reviewed at length with the patient today.  Concerns regarding medicines are outlined above.  Medication changes, Labs and Tests ordered today are listed in the Patient Instructions below. Patient Instructions  Medication Instructions:  Your physician recommends that you continue on your current medications as directed. Please refer to the Current Medication list given to you today.  If you need a refill on your cardiac medications before your next appointment, please call your pharmacy.   Lab work: NONE  If you have labs (blood work) drawn today and your tests are completely normal, you will receive your results only by: [name] Kitchen MyChart Message (if you have MyChart) OR . A paper copy in the mail If you have any lab test that is abnormal or we need to change your treatment, we will call you to review the results.  Testing/Procedures: NONE   Follow-Up: At [facility], you and your health needs are our priority.  As part of our continuing mission to provide you with exceptional heart care, we have created designated Provider Care Teams.  These Care Teams include your primary Cardiologist (physician) and Advanced Practice Providers (APPs -  Physician Assistants and Nurse Practitioners) who all work together to provide you with the care you need, when you need it. You will need a follow up appointment in 4 months.  Please call our office 2 months in advance to schedule this appointment.  You may see Rozann Lesches, MD or one of the following Advanced Practice Providers on your designated Care Team:   Bernerd Pho, PA-C [facility]) . Ermalinda Barrios, PA-C ([place])  Any Other Special Instructions Will Be Listed Below (If Applicable). Thank you for choosing [facility]!     Signed, Erma Heritage, PA-C  07/05/2018 4:41 PM    [place]. [address] [place], [place] [address] Phone: [phone]

## 2018-07-05 ENCOUNTER — Encounter: Payer: Self-pay | Admitting: Student

## 2018-07-05 ENCOUNTER — Ambulatory Visit (INDEPENDENT_AMBULATORY_CARE_PROVIDER_SITE_OTHER): Payer: Medicare Other | Admitting: Student

## 2018-07-05 VITALS — BP 138/86 | HR 87 | Ht 60.0 in | Wt 219.0 lb

## 2018-07-05 DIAGNOSIS — I10 Essential (primary) hypertension: Secondary | ICD-10-CM | POA: Diagnosis not present

## 2018-07-05 DIAGNOSIS — G4733 Obstructive sleep apnea (adult) (pediatric): Secondary | ICD-10-CM | POA: Diagnosis not present

## 2018-07-05 DIAGNOSIS — I5032 Chronic diastolic (congestive) heart failure: Secondary | ICD-10-CM | POA: Diagnosis not present

## 2018-07-05 DIAGNOSIS — N184 Chronic kidney disease, stage 4 (severe): Secondary | ICD-10-CM | POA: Diagnosis not present

## 2018-07-05 DIAGNOSIS — E782 Mixed hyperlipidemia: Secondary | ICD-10-CM

## 2018-07-05 NOTE — Patient Instructions (Signed)
Medication Instructions:  Your physician recommends that you continue on your current medications as directed. Please refer to the Current Medication list given to you today.  If you need a refill on your cardiac medications before your next appointment, please call your pharmacy.   Lab work: NONE  If you have labs (blood work) drawn today and your tests are completely normal, you will receive your results only by: [name] Kitchen MyChart Message (if you have MyChart) OR . A paper copy in the mail If you have any lab test that is abnormal or we need to change your treatment, we will call you to review the results.  Testing/Procedures: NONE   Follow-Up: At [facility], you and your health needs are our priority.  As part of our continuing mission to provide you with exceptional heart care, we have created designated Provider Care Teams.  These Care Teams include your primary Cardiologist (physician) and Advanced Practice Providers (APPs -  Physician Assistants and Nurse Practitioners) who all work together to provide you with the care you need, when you need it. You will need a follow up appointment in 4 months.  Please call our office 2 months in advance to schedule this appointment.  You may see Rozann Lesches, MD or one of the following Advanced Practice Providers on your designated Care Team:   Bernerd Pho, PA-C [facility]) . Ermalinda Barrios, PA-C ([place])  Any Other Special Instructions Will Be Listed Below (If Applicable). Thank you for choosing [facility]!

## 2018-07-06 DIAGNOSIS — E11621 Type 2 diabetes mellitus with foot ulcer: Secondary | ICD-10-CM | POA: Diagnosis not present

## 2018-07-06 DIAGNOSIS — E1122 Type 2 diabetes mellitus with diabetic chronic kidney disease: Secondary | ICD-10-CM | POA: Diagnosis not present

## 2018-07-06 DIAGNOSIS — I130 Hypertensive heart and chronic kidney disease with heart failure and stage 1 through stage 4 chronic kidney disease, or unspecified chronic kidney disease: Secondary | ICD-10-CM | POA: Diagnosis not present

## 2018-07-06 DIAGNOSIS — I5032 Chronic diastolic (congestive) heart failure: Secondary | ICD-10-CM | POA: Diagnosis not present

## 2018-07-06 DIAGNOSIS — L97512 Non-pressure chronic ulcer of other part of right foot with fat layer exposed: Secondary | ICD-10-CM | POA: Diagnosis not present

## 2018-07-06 DIAGNOSIS — N183 Chronic kidney disease, stage 3 (moderate): Secondary | ICD-10-CM | POA: Diagnosis not present

## 2018-07-07 DIAGNOSIS — E11621 Type 2 diabetes mellitus with foot ulcer: Secondary | ICD-10-CM | POA: Diagnosis not present

## 2018-07-07 DIAGNOSIS — L97518 Non-pressure chronic ulcer of other part of right foot with other specified severity: Secondary | ICD-10-CM | POA: Diagnosis not present

## 2018-07-07 DIAGNOSIS — E669 Obesity, unspecified: Secondary | ICD-10-CM | POA: Diagnosis not present

## 2018-07-07 DIAGNOSIS — L97519 Non-pressure chronic ulcer of other part of right foot with unspecified severity: Secondary | ICD-10-CM | POA: Diagnosis not present

## 2018-07-07 DIAGNOSIS — L84 Corns and callosities: Secondary | ICD-10-CM | POA: Diagnosis not present

## 2018-07-10 DIAGNOSIS — I5032 Chronic diastolic (congestive) heart failure: Secondary | ICD-10-CM | POA: Diagnosis not present

## 2018-07-10 DIAGNOSIS — I130 Hypertensive heart and chronic kidney disease with heart failure and stage 1 through stage 4 chronic kidney disease, or unspecified chronic kidney disease: Secondary | ICD-10-CM | POA: Diagnosis not present

## 2018-07-10 DIAGNOSIS — L97512 Non-pressure chronic ulcer of other part of right foot with fat layer exposed: Secondary | ICD-10-CM | POA: Diagnosis not present

## 2018-07-10 DIAGNOSIS — E1122 Type 2 diabetes mellitus with diabetic chronic kidney disease: Secondary | ICD-10-CM | POA: Diagnosis not present

## 2018-07-10 DIAGNOSIS — N183 Chronic kidney disease, stage 3 (moderate): Secondary | ICD-10-CM | POA: Diagnosis not present

## 2018-07-10 DIAGNOSIS — E11621 Type 2 diabetes mellitus with foot ulcer: Secondary | ICD-10-CM | POA: Diagnosis not present

## 2018-07-11 DIAGNOSIS — F29 Unspecified psychosis not due to a substance or known physiological condition: Secondary | ICD-10-CM | POA: Diagnosis not present

## 2018-07-11 DIAGNOSIS — L97512 Non-pressure chronic ulcer of other part of right foot with fat layer exposed: Secondary | ICD-10-CM | POA: Diagnosis not present

## 2018-07-11 DIAGNOSIS — E11621 Type 2 diabetes mellitus with foot ulcer: Secondary | ICD-10-CM | POA: Diagnosis not present

## 2018-07-11 DIAGNOSIS — I5032 Chronic diastolic (congestive) heart failure: Secondary | ICD-10-CM | POA: Diagnosis not present

## 2018-07-11 DIAGNOSIS — I130 Hypertensive heart and chronic kidney disease with heart failure and stage 1 through stage 4 chronic kidney disease, or unspecified chronic kidney disease: Secondary | ICD-10-CM | POA: Diagnosis not present

## 2018-07-11 DIAGNOSIS — N183 Chronic kidney disease, stage 3 (moderate): Secondary | ICD-10-CM | POA: Diagnosis not present

## 2018-07-11 DIAGNOSIS — E1122 Type 2 diabetes mellitus with diabetic chronic kidney disease: Secondary | ICD-10-CM | POA: Diagnosis not present

## 2018-07-12 ENCOUNTER — Telehealth: Payer: Self-pay | Admitting: Cardiology

## 2018-07-12 NOTE — Telephone Encounter (Signed)
   With her having a recent weight gain, would recommend she take an extra Torsemide 40 mg this afternoon as outlined in her office note on 07/05/2018. Would then resume at 40mg  daily.  Signed, Erma Heritage, PA-C 07/12/2018, 3:50 PM Pager: [phone]

## 2018-07-12 NOTE — Telephone Encounter (Signed)
Spoke with Butch Penny a [employer] pt has had 2 lb weight gain since yesterday. Pt denies SOB, Chest pain and swelling at this time. Please advise.

## 2018-07-12 NOTE — Telephone Encounter (Signed)
Verbal order given to [facility] at [facility]. Anderson Malta voiced understanding.

## 2018-07-12 NOTE — Telephone Encounter (Signed)
Per phone call from Butch Penny at [facility], pt has had a 2lb weight gain since yesterday.   Please call.

## 2018-07-14 ENCOUNTER — Encounter (HOSPITAL_COMMUNITY): Payer: Self-pay | Admitting: Emergency Medicine

## 2018-07-14 ENCOUNTER — Emergency Department (HOSPITAL_COMMUNITY): Payer: Medicare Other

## 2018-07-14 ENCOUNTER — Other Ambulatory Visit: Payer: Self-pay

## 2018-07-14 ENCOUNTER — Observation Stay (HOSPITAL_COMMUNITY)
Admission: EM | Admit: 2018-07-14 | Discharge: 2018-07-15 | Disposition: A | Discharge status: Home / Self Care | Payer: Medicare Other | Attending: Internal Medicine | Admitting: Internal Medicine

## 2018-07-14 DIAGNOSIS — N184 Chronic kidney disease, stage 4 (severe): Secondary | ICD-10-CM

## 2018-07-14 DIAGNOSIS — Z86711 Personal history of pulmonary embolism: Secondary | ICD-10-CM

## 2018-07-14 DIAGNOSIS — L97519 Non-pressure chronic ulcer of other part of right foot with unspecified severity: Secondary | ICD-10-CM

## 2018-07-14 DIAGNOSIS — I130 Hypertensive heart and chronic kidney disease with heart failure and stage 1 through stage 4 chronic kidney disease, or unspecified chronic kidney disease: Secondary | ICD-10-CM | POA: Insufficient documentation

## 2018-07-14 DIAGNOSIS — E11621 Type 2 diabetes mellitus with foot ulcer: Secondary | ICD-10-CM

## 2018-07-14 DIAGNOSIS — I5032 Chronic diastolic (congestive) heart failure: Secondary | ICD-10-CM | POA: Insufficient documentation

## 2018-07-14 DIAGNOSIS — Z87891 Personal history of nicotine dependence: Secondary | ICD-10-CM | POA: Diagnosis not present

## 2018-07-14 DIAGNOSIS — R7989 Other specified abnormal findings of blood chemistry: Secondary | ICD-10-CM

## 2018-07-14 DIAGNOSIS — R0789 Other chest pain: Secondary | ICD-10-CM | POA: Diagnosis not present

## 2018-07-14 DIAGNOSIS — Z79899 Other long term (current) drug therapy: Secondary | ICD-10-CM | POA: Diagnosis not present

## 2018-07-14 DIAGNOSIS — R0781 Pleurodynia: Principal | ICD-10-CM

## 2018-07-14 DIAGNOSIS — L97509 Non-pressure chronic ulcer of other part of unspecified foot with unspecified severity: Secondary | ICD-10-CM

## 2018-07-14 DIAGNOSIS — D649 Anemia, unspecified: Secondary | ICD-10-CM | POA: Diagnosis present

## 2018-07-14 DIAGNOSIS — J45909 Unspecified asthma, uncomplicated: Secondary | ICD-10-CM | POA: Diagnosis not present

## 2018-07-14 DIAGNOSIS — J4540 Moderate persistent asthma, uncomplicated: Secondary | ICD-10-CM | POA: Insufficient documentation

## 2018-07-14 DIAGNOSIS — I499 Cardiac arrhythmia, unspecified: Secondary | ICD-10-CM | POA: Diagnosis not present

## 2018-07-14 DIAGNOSIS — E1142 Type 2 diabetes mellitus with diabetic polyneuropathy: Secondary | ICD-10-CM | POA: Diagnosis not present

## 2018-07-14 DIAGNOSIS — F209 Schizophrenia, unspecified: Secondary | ICD-10-CM

## 2018-07-14 DIAGNOSIS — D500 Iron deficiency anemia secondary to blood loss (chronic): Secondary | ICD-10-CM | POA: Diagnosis not present

## 2018-07-14 DIAGNOSIS — Z7982 Long term (current) use of aspirin: Secondary | ICD-10-CM | POA: Diagnosis not present

## 2018-07-14 DIAGNOSIS — R079 Chest pain, unspecified: Secondary | ICD-10-CM | POA: Diagnosis not present

## 2018-07-14 DIAGNOSIS — R0602 Shortness of breath: Secondary | ICD-10-CM | POA: Diagnosis not present

## 2018-07-14 DIAGNOSIS — R0989 Other specified symptoms and signs involving the circulatory and respiratory systems: Secondary | ICD-10-CM | POA: Diagnosis not present

## 2018-07-14 DIAGNOSIS — E1122 Type 2 diabetes mellitus with diabetic chronic kidney disease: Secondary | ICD-10-CM | POA: Insufficient documentation

## 2018-07-14 DIAGNOSIS — E1165 Type 2 diabetes mellitus with hyperglycemia: Secondary | ICD-10-CM | POA: Diagnosis not present

## 2018-07-14 DIAGNOSIS — R071 Chest pain on breathing: Secondary | ICD-10-CM | POA: Diagnosis not present

## 2018-07-14 LAB — CBC WITH DIFFERENTIAL/PLATELET
Abs Immature Granulocytes: 0.04 10*3/uL (ref 0.00–0.07)
Basophils Absolute: 0 10*3/uL (ref 0.0–0.1)
Basophils Relative: 0 %
Eosinophils Absolute: 0.3 10*3/uL (ref 0.0–0.5)
Eosinophils Relative: 4 %
HCT: 27.9 % — ABNORMAL LOW (ref 36.0–46.0)
Hemoglobin: 9 g/dL — ABNORMAL LOW (ref 12.0–15.0)
Immature Granulocytes: 1 %
Lymphocytes Relative: 15 %
Lymphs Abs: 1.2 10*3/uL (ref 0.7–4.0)
MCH: 31.4 pg (ref 26.0–34.0)
MCHC: 32.3 g/dL (ref 30.0–36.0)
MCV: 97.2 fL (ref 80.0–100.0)
Monocytes Absolute: 0.7 10*3/uL (ref 0.1–1.0)
Monocytes Relative: 8 %
Neutro Abs: 6.2 10*3/uL (ref 1.7–7.7)
Neutrophils Relative %: 72 %
Platelets: 168 10*3/uL (ref 150–400)
RBC: 2.87 MIL/uL — ABNORMAL LOW (ref 3.87–5.11)
RDW: 12.9 % (ref 11.5–15.5)
WBC: 8.4 10*3/uL (ref 4.0–10.5)
nRBC: 0 % (ref 0.0–0.2)

## 2018-07-14 LAB — CBG MONITORING, ED: Glucose-Capillary: 241 mg/dL — ABNORMAL HIGH (ref 70–99)

## 2018-07-14 LAB — TROPONIN I: Troponin I: <0.03 ng/mL (ref <0.03)

## 2018-07-14 LAB — BASIC METABOLIC PANEL
Anion gap: 8 (ref 5–15)
BUN: 38 mg/dL — ABNORMAL HIGH (ref 8–23)
CO2: 24 mmol/L (ref 22–32)
Calcium: 8.8 mg/dL — ABNORMAL LOW (ref 8.9–10.3)
Chloride: 103 mmol/L (ref 98–111)
Creatinine, Ser: 2.17 mg/dL — ABNORMAL HIGH (ref 0.44–1.00)
GFR calc Af Amer: 27 mL/min — ABNORMAL LOW (ref >60)
GFR calc non Af Amer: 23 mL/min — ABNORMAL LOW (ref >60)
Glucose, Bld: 250 mg/dL — ABNORMAL HIGH (ref 70–99)
Potassium: 4.1 mmol/L (ref 3.5–5.1)
Sodium: 135 mmol/L (ref 135–145)

## 2018-07-14 LAB — D-DIMER, QUANTITATIVE: D-Dimer, Quant: 1.66 ug/mL-FEU — ABNORMAL HIGH (ref 0.00–0.50)

## 2018-07-14 MED ORDER — SPIRONOLACTONE 25 MG PO TABS
25.0000 mg | ORAL_TABLET | Freq: Every day | ORAL | Status: DC
  Administered 2018-07-15: 25 mg via ORAL
  Filled 2018-07-14: qty 1

## 2018-07-14 MED ORDER — TORSEMIDE 20 MG PO TABS
40.0000 mg | ORAL_TABLET | Freq: Every day | ORAL | Status: DC
  Administered 2018-07-15: 40 mg via ORAL
  Filled 2018-07-14: qty 2

## 2018-07-14 MED ORDER — NITROGLYCERIN 0.4 MG SL SUBL: 0.4000 mg | SUBLINGUAL_TABLET | SUBLINGUAL | Status: DC | PRN

## 2018-07-14 MED ORDER — ENOXAPARIN SODIUM 100 MG/ML ~~LOC~~ SOLN
1.0000 mg/kg | Freq: Once | SUBCUTANEOUS | Status: AC
  Administered 2018-07-14: 100 mg via SUBCUTANEOUS
  Filled 2018-07-14: qty 1

## 2018-07-14 MED ORDER — ATORVASTATIN CALCIUM 40 MG PO TABS
40.0000 mg | ORAL_TABLET | Freq: Every day | ORAL | Status: DC
  Filled 2018-07-14: qty 1

## 2018-07-14 MED ORDER — ASPIRIN 81 MG PO CHEW
81.0000 mg | CHEWABLE_TABLET | Freq: Every day | ORAL | Status: DC
  Administered 2018-07-15: 81 mg via ORAL
  Filled 2018-07-14: qty 1

## 2018-07-14 MED ORDER — SODIUM CHLORIDE 0.9% FLUSH
3.0000 mL | Freq: Two times a day (BID) | INTRAVENOUS | Status: DC
  Administered 2018-07-14 – 2018-07-15 (×2): 3 mL via INTRAVENOUS

## 2018-07-14 MED ORDER — ALBUTEROL SULFATE (2.5 MG/3ML) 0.083% IN NEBU: 2.5000 mg | INHALATION_SOLUTION | Freq: Four times a day (QID) | RESPIRATORY_TRACT | Status: DC | PRN

## 2018-07-14 MED ORDER — ASPIRIN EC 81 MG PO TBEC: 81.0000 mg | DELAYED_RELEASE_TABLET | Freq: Every day | ORAL | Status: DC

## 2018-07-14 MED ORDER — INSULIN GLARGINE 100 UNIT/ML ~~LOC~~ SOLN
10.0000 [IU] | Freq: Every day | SUBCUTANEOUS | Status: DC
  Administered 2018-07-14: 10 [IU] via SUBCUTANEOUS
  Filled 2018-07-14 (×3): qty 0.1

## 2018-07-14 MED ORDER — DULOXETINE HCL 30 MG PO CPEP
60.0000 mg | ORAL_CAPSULE | Freq: Every day | ORAL | Status: DC
  Administered 2018-07-15: 60 mg via ORAL
  Filled 2018-07-14: qty 2

## 2018-07-14 MED ORDER — GABAPENTIN 300 MG PO CAPS: 300.0000 mg | ORAL_CAPSULE | ORAL | Status: DC

## 2018-07-14 MED ORDER — RISPERIDONE 0.5 MG PO TABS
0.5000 mg | ORAL_TABLET | Freq: Two times a day (BID) | ORAL | Status: DC
  Administered 2018-07-14 – 2018-07-15 (×2): 0.5 mg via ORAL
  Filled 2018-07-14 (×2): qty 1

## 2018-07-14 MED ORDER — PANTOPRAZOLE SODIUM 40 MG PO TBEC
40.0000 mg | DELAYED_RELEASE_TABLET | Freq: Every day | ORAL | Status: DC
  Administered 2018-07-15: 40 mg via ORAL
  Filled 2018-07-14 (×2): qty 1

## 2018-07-14 MED ORDER — HYDROCODONE-ACETAMINOPHEN 5-325 MG PO TABS: 1.0000 | ORAL_TABLET | Freq: Three times a day (TID) | ORAL | Status: DC | PRN

## 2018-07-14 MED ORDER — SODIUM CHLORIDE 0.9 % IV SOLN: 250.0000 mL | INTRAVENOUS | Status: DC | PRN

## 2018-07-14 MED ORDER — ONDANSETRON HCL 4 MG/2ML IJ SOLN: 4.0000 mg | Freq: Four times a day (QID) | INTRAMUSCULAR | Status: DC | PRN

## 2018-07-14 MED ORDER — PRAZOSIN HCL 1 MG PO CAPS
ORAL_CAPSULE | ORAL | Status: AC
  Filled 2018-07-14: qty 2

## 2018-07-14 MED ORDER — HYDROXYZINE HCL 25 MG PO TABS: 25.0000 mg | ORAL_TABLET | Freq: Two times a day (BID) | ORAL | Status: DC | PRN

## 2018-07-14 MED ORDER — INSULIN ASPART 100 UNIT/ML ~~LOC~~ SOLN
0.0000 [IU] | Freq: Three times a day (TID) | SUBCUTANEOUS | Status: DC
  Administered 2018-07-15: 1 [IU] via SUBCUTANEOUS

## 2018-07-14 MED ORDER — MOMETASONE FURO-FORMOTEROL FUM 200-5 MCG/ACT IN AERO
2.0000 | INHALATION_SPRAY | Freq: Two times a day (BID) | RESPIRATORY_TRACT | Status: DC
  Filled 2018-07-14: qty 8.8

## 2018-07-14 MED ORDER — DOCUSATE SODIUM 100 MG PO CAPS
100.0000 mg | ORAL_CAPSULE | Freq: Every day | ORAL | Status: DC
  Administered 2018-07-15: 100 mg via ORAL
  Filled 2018-07-14 (×2): qty 1

## 2018-07-14 MED ORDER — TRAZODONE HCL 50 MG PO TABS: 100.0000 mg | ORAL_TABLET | Freq: Every evening | ORAL | Status: DC | PRN

## 2018-07-14 MED ORDER — LOSARTAN POTASSIUM 50 MG PO TABS
100.0000 mg | ORAL_TABLET | Freq: Every day | ORAL | Status: DC
  Administered 2018-07-15: 100 mg via ORAL
  Filled 2018-07-14: qty 2

## 2018-07-14 MED ORDER — LEVOTHYROXINE SODIUM 50 MCG PO TABS
25.0000 ug | ORAL_TABLET | Freq: Every day | ORAL | Status: DC
  Administered 2018-07-15: 25 ug via ORAL
  Filled 2018-07-14: qty 1

## 2018-07-14 MED ORDER — INSULIN ASPART 100 UNIT/ML ~~LOC~~ SOLN
0.0000 [IU] | Freq: Every day | SUBCUTANEOUS | Status: DC
  Administered 2018-07-14: 2 [IU] via SUBCUTANEOUS
  Filled 2018-07-14: qty 1

## 2018-07-14 MED ORDER — ACETAMINOPHEN 325 MG PO TABS: 650.0000 mg | ORAL_TABLET | ORAL | Status: DC | PRN

## 2018-07-14 MED ORDER — ASPIRIN 81 MG PO CHEW: 324.0000 mg | CHEWABLE_TABLET | Freq: Once | ORAL | Status: DC

## 2018-07-14 MED ORDER — ENOXAPARIN SODIUM 100 MG/ML ~~LOC~~ SOLN: 1.0000 mg/kg | SUBCUTANEOUS | Status: DC

## 2018-07-14 MED ORDER — SODIUM CHLORIDE 0.9% FLUSH: 3.0000 mL | INTRAVENOUS | Status: DC | PRN

## 2018-07-14 MED ORDER — PRAZOSIN HCL 1 MG PO CAPS
2.0000 mg | ORAL_CAPSULE | Freq: Every day | ORAL | Status: DC
  Administered 2018-07-14: 2 mg via ORAL
  Filled 2018-07-14 (×3): qty 1

## 2018-07-14 MED ORDER — GABAPENTIN 300 MG PO CAPS
300.0000 mg | ORAL_CAPSULE | Freq: Two times a day (BID) | ORAL | Status: DC
  Administered 2018-07-15: 300 mg via ORAL
  Filled 2018-07-14: qty 1

## 2018-07-14 MED ORDER — GABAPENTIN 300 MG PO CAPS
600.0000 mg | ORAL_CAPSULE | Freq: Every day | ORAL | Status: DC
  Administered 2018-07-14: 600 mg via ORAL
  Filled 2018-07-14: qty 2

## 2018-07-14 MED ORDER — MORPHINE SULFATE (PF) 4 MG/ML IV SOLN: 3.0000 mg | INTRAVENOUS | Status: DC | PRN

## 2018-07-14 MED ORDER — DOXYCYCLINE HYCLATE 100 MG PO TABS
100.0000 mg | ORAL_TABLET | Freq: Two times a day (BID) | ORAL | Status: DC
  Administered 2018-07-14 – 2018-07-15 (×2): 100 mg via ORAL
  Filled 2018-07-14 (×2): qty 1

## 2018-07-14 MED ORDER — MONTELUKAST SODIUM 10 MG PO TABS
10.0000 mg | ORAL_TABLET | Freq: Every day | ORAL | Status: DC
  Administered 2018-07-14: 10 mg via ORAL
  Filled 2018-07-14: qty 1

## 2018-07-14 NOTE — ED Triage Notes (Signed)
Patient complaining of chest pain with shortness of breath that started today. Patient given 1 nitro spray and 324 of ASA by EMS.

## 2018-07-14 NOTE — H&P (Signed)
History and Physical    [name] RKY:[id number] DOB: 1951/01/19 DOA: 07/14/2018  PCP: Glenda Chroman, MD   Patient coming from: ALF  Chief Complaint: Chest pain, SOB   HPI: [name] is a 67 y.o. female with medical history significant for schizophrenia, chronic diastolic CHF, chronic kidney disease stage IV, asthma, chronic anemia, chronic pain, and history of PE in 2010 no longer on anticoagulation, now presenting to the emergency department for evaluation of acute onset of chest pain and shortness of breath.  Patient reports that she was working on a puzzle at her assisted living facility when she developed the acute onset of pleuritic chest pain and dyspnea.  She denies any fevers, chills, or cough associated with this.  Pain has been constant, moderate to severe in intensity, sharp in character, localized to the left anterior chest, and without any appreciable alleviating or exacerbating factors identified.  She has a poorly healing ulcer on her right foot and is wearing an orthopedic boot.  ED Course: Upon arrival to the ED, patient is found to be afebrile, saturating well on room air, hypertensive, and vitals otherwise normal.  EKG features of ectopic atrial rhythm with RBBB, LAFB, and LVH, similar to prior.  Chest x-ray is notable for cardiomegaly and vascular congestion.  Chemistry panel features a glucose of 250 and creatinine 2.17, similar to priors.  CBC is notable for a chronic stable normocytic anemia with hemoglobin of 9.0.  Troponin is undetectable.  D-dimer is elevated to 1.66.  Patient was given 324 mg of aspirin and 1 mg/kg of Lovenox in the ED.  She remains hemodynamically stable and will be observed for ongoing evaluation and management of pleuritic chest pain with elevated d-dimer and history of PE no longer on anticoagulation.  Review of Systems:  All other systems reviewed and apart from HPI, are negative.  Past Medical History:  Diagnosis Date  . Anxiety   .  Asthma   . Back pain   . Barrett esophagus   . CHF (congestive heart failure) ([place])   . CKD (chronic kidney disease) stage 3, GFR 30-59 ml/min (HCC)   . COPD (chronic obstructive pulmonary disease) ([place])   . Depression   . Diastolic heart failure ([place])   . Essential hypertension   . Fibromyalgia   . GERD (gastroesophageal reflux disease)   . Gout   . History of pericarditis   . Hyperlipidemia   . Hypothyroid   . Major depressive disorder   . Memory loss   . Nephrolithiasis    2008  . Obesity hypoventilation syndrome (HCC)    CPAP  . Obstructive sleep apnea   . PE (pulmonary embolism)    2010  . Peripheral neuropathy   . PTSD (post-traumatic stress disorder)   . Rheumatoid arthritis ([place])   . Schizophrenia ([place])   . Steroid dependence   . Type 2 diabetes mellitus ([place])   . Vitamin D deficiency     Past Surgical History:  Procedure Laterality Date  . APPENDECTOMY    . CHOLECYSTECTOMY    . COLONOSCOPY     benign polyps (12/2000), repeat colonoscopy performed in 2007  . ENDOMETRIAL BIOPSY     10/03 benign columnar mucosa (limited material)  . KIDNEY SURGERY       reports that she quit smoking about 46 years ago. Her smoking use included cigarettes. She has never used smokeless tobacco. She reports that she does not drink alcohol or use drugs.  Allergies  Allergen Reactions  .  Benztropine Mesylate Other (See Comments)    REACTION: Alopecia and white spots on eyes  . Codeine Other (See Comments)    REACTION: Mouth Swelling - Tachycardia  . Diazepam Other (See Comments)    REACTION: COMA  . Diphenhydramine Hcl Other (See Comments)    REACTION: Tachycardia , anxiousness  . Penicillins Other (See Comments)    REACTION: ulcers in mouth, swelling, throat swelling  Has patient had a PCN reaction causing immediate rash, facial/tongue/throat swelling, SOB or lightheadedness with hypotension: YES Has patient had a PCN reaction causing severe rash involving Mucus membranes or  skin necrosis: Unknown Has patient had a PCN reaction that required hospitalization Unknown Has patient had a PCN reaction occurring within the last 10 years: unknown If all of the above answers are "NO", then may proceed with Cephalospori  . Sulfonamide Derivatives Other (See Comments)    REACTION: rash - blisters  . Thioridazine Hcl Other (See Comments)    REACTION: swelling  . Lisinopril Other (See Comments)    REACTION: cough    Family History  Problem Relation Age of Onset  . Bone cancer Mother   . Hypertension Mother   . Heart disease Mother   . COPD Father   . Heart disease Father   . Stroke Father      Prior to Admission medications   Medication Sig Start Date End Date Taking? Authorizing Provider  acetaminophen (TYLENOL) 325 MG tablet Take 325 mg by mouth 2 (two) times daily.     [provider]  albuterol (PROVENTIL HFA;VENTOLIN HFA) 108 (90 Base) MCG/ACT inhaler Inhale 2 puffs into the lungs every 6 (six) hours as needed for wheezing or shortness of breath.    [provider]  albuterol (PROVENTIL) (2.5 MG/3ML) 0.083% nebulizer solution Take 2.5 mg by nebulization every 6 (six) hours as needed for wheezing or shortness of breath.    [provider]  aspirin 81 MG tablet Take 81 mg by mouth daily.    [provider]  atorvastatin (LIPITOR) 40 MG tablet Take 40 mg by mouth daily.     [provider]  budesonide-formoterol (SYMBICORT) 160-4.5 MCG/ACT inhaler Inhale 2 puffs into the lungs 2 (two) times daily.    [provider]  carvedilol (COREG) 25 MG tablet Take 25 mg by mouth 2 (two) times daily with a meal.    [provider]  Cholecalciferol (VITAMIN D) 2000 units CAPS Take 4,000 Units by mouth daily.     [provider]  docusate sodium (COLACE) 100 MG capsule Take 100 mg by mouth daily.     [provider]  doxycycline (VIBRAMYCIN) 100 MG capsule Take 1 capsule (100 mg total) by mouth 2  (two) times daily. 04/27/18   Noemi Chapel, MD  DULoxetine (CYMBALTA) 60 MG capsule Take 60 mg by mouth daily.    [provider]  EPINEPHrine (EPI-PEN) 0.3 mg/0.3 mL DEVI Inject 0.3 mg into the muscle once.    [provider]  gabapentin (NEURONTIN) 300 MG capsule Take 300-600 mg by mouth See admin instructions. 300mg  twice daily and 600mg  at bedtime 04/29/17   [provider]  glipiZIDE (GLUCOTROL XL) 5 MG 24 hr tablet Take 5 mg by mouth daily with breakfast.     [provider]  hydrocortisone 2.5 % ointment Apply 1 application topically daily as needed (itchy skin).    [provider]  hydrOXYzine (VISTARIL) 25 MG capsule Take 25 mg by mouth 2 (two) times daily as  needed for anxiety.     [provider]  levothyroxine (SYNTHROID, LEVOTHROID) 25 MCG tablet Take 25 mcg by mouth daily before breakfast.    [provider]  linagliptin (TRADJENTA) 5 MG TABS tablet Take 5 mg by mouth daily.    [provider]  loperamide (IMODIUM) 2 MG capsule Take 2 mg by mouth every 8 (eight) hours as needed for diarrhea or loose stools.     [provider]  LORazepam (ATIVAN) 1 MG tablet Take 1 mg by mouth daily as needed for anxiety.     [provider]  losartan (COZAAR) 100 MG tablet Take 100 mg by mouth daily.    [provider]  montelukast (SINGULAIR) 10 MG tablet Take 10 mg by mouth at bedtime.    [provider]  Multiple Vitamin (DAILY VITE PO) Take 1 tablet by mouth daily.    [provider]  omeprazole (PRILOSEC) 20 MG capsule Take 20 mg by mouth daily.     [provider]  prazosin (MINIPRESS) 2 MG capsule Take 2 mg by mouth at bedtime. For PTSD associated nightmares    [provider]  pyridOXINE (VITAMIN B-6) 100 MG tablet Take 200 mg by mouth 2 (two) times daily.    [provider]  risperiDONE (RISPERDAL) 0.5 MG tablet Take 0.5 mg by mouth 2 (two) times  daily.     [provider]  spironolactone (ALDACTONE) 25 MG tablet Take 1 tablet (25 mg total) by mouth daily. 04/12/18   Mesner, Corene Cornea, MD  torsemide (DEMADEX) 10 MG tablet Take 40 mg by mouth daily.  04/29/17   [provider]  traZODone (DESYREL) 100 MG tablet Take 100 mg by mouth at bedtime as needed for sleep.     [provider]    Physical Exam: Vitals:   07/14/18 2027 07/14/18 2032 07/14/18 2100 07/14/18 2135  BP:  (!) 185/86  (!) 168/75  Pulse:  79 78 77  Resp:  14 18 19   Temp:  98.1 F (36.7 C)    TempSrc:  Oral    SpO2:  100% 100% 99%  Weight: 101.2 kg     Height: 5' (1.524 m)       Constitutional: NAD, calm Eyes: PERTLA, lids and conjunctivae normal ENMT: Mucous membranes are moist. Posterior pharynx clear of any exudate or lesions.   Neck: normal, supple, no masses, no thyromegaly Respiratory: clear to auscultation bilaterally, no wheezing, no crackles. Normal respiratory effort.    Cardiovascular: S1 & S2 heard, regular rate and rhythm. Pretibial pitting edema bilaterally, right > left. Abdomen: No distension, no tenderness, soft. Bowel sounds normal.  Musculoskeletal: no clubbing / cyanosis. No joint deformity upper and lower extremities.  Skin: no significant rashes, lesions, ulcers. Warm, dry, well-perfused. Neurologic: No facial asymmetry. Sensation intact. Moving all extremities.  Psychiatric: Alert and oriented to person, place, and situation. Calm, cooperative.    Labs on Admission: I have personally reviewed following labs and imaging studies  CBC: Recent Labs  Lab 07/14/18 2056  WBC 8.4  NEUTROABS 6.2  HGB 9.0*  HCT 27.9*  MCV 97.2  PLT 096   Basic Metabolic Panel: Recent Labs  Lab 07/14/18 2056  NA 135  K 4.1  CL 103  CO2 24  GLUCOSE 250*  BUN 38*  CREATININE 2.17*  CALCIUM 8.8*   GFR: Estimated Creatinine Clearance: 27.3 mL/min (A) (by C-G formula based on SCr of 2.17 mg/dL (H)). Liver Function Tests: No  results for  input(s): AST, ALT, ALKPHOS, BILITOT, PROT, ALBUMIN in the last 168 hours. No results for input(s): LIPASE, AMYLASE in the last 168 hours. No results for input(s): AMMONIA in the last 168 hours. Coagulation Profile: No results for input(s): INR, PROTIME in the last 168 hours. Cardiac Enzymes: Recent Labs  Lab 07/14/18 2056  TROPONINI <0.03   BNP (last 3 results) No results for input(s): PROBNP in the last 8760 hours. HbA1C: No results for input(s): HGBA1C in the last 72 hours. CBG: No results for input(s): GLUCAP in the last 168 hours. Lipid Profile: No results for input(s): CHOL, HDL, LDLCALC, TRIG, CHOLHDL, LDLDIRECT in the last 72 hours. Thyroid Function Tests: No results for input(s): TSH, T4TOTAL, FREET4, T3FREE, THYROIDAB in the last 72 hours. Anemia Panel: No results for input(s): VITAMINB12, FOLATE, FERRITIN, TIBC, IRON, RETICCTPCT in the last 72 hours. Urine analysis:    Component Value Date/Time   COLORURINE YELLOW 06/01/2018 1434   APPEARANCEUR CLOUDY (A) 06/01/2018 1434   LABSPEC 1.011 06/01/2018 1434   PHURINE 5.0 06/01/2018 1434   GLUCOSEU 150 (A) 06/01/2018 1434   HGBUR NEGATIVE 06/01/2018 1434   HGBUR negative 10/20/2009 1425   BILIRUBINUR NEGATIVE 06/01/2018 1434   KETONESUR NEGATIVE 06/01/2018 1434   PROTEINUR >=300 (A) 06/01/2018 1434   UROBILINOGEN 0.2 10/05/2011 1002   NITRITE NEGATIVE 06/01/2018 1434   LEUKOCYTESUR MODERATE (A) 06/01/2018 1434   Sepsis Labs: @LABRCNTIP (procalcitonin:4,lacticidven:4) )No results found for this or any previous visit (from the past 240 hour(s)).   Radiological Exams on Admission: Dg Chest Port 1 View  Result Date: 07/14/2018 CLINICAL DATA:  Chest pain and shortness of breath EXAM: PORTABLE CHEST 1 VIEW COMPARISON:  04/18/2018 FINDINGS: There is shallow lung inflation with pulmonary vascular congestion. Mild cardiomegaly. No pleural effusion or pneumothorax. No focal airspace consolidation. IMPRESSION:  Cardiomegaly and pulmonary vascular congestion. Electronically Signed   By: Ulyses Jarred M.D.   On: 07/14/2018 21:19    EKG: Independently reviewed. Ectopic atrial rhythm, RBBB, LAFB, LVH. Similar to prior.   Assessment/Plan   1. Pleuritic chest pain; positive d-dimer; history of PE  - Presents with acute-onset of pleuritic pain and SOB, found to have undetectable troponin, EKG without acute changes, vascular congestion on CXR, and d-dimer elevated to 1.66  - She is wearing a boot on right foot for poorly-healing ulcer, right leg larger than left, and she has hx of PE no longer on [facility]  - CTA precluded by renal insufficiency  - She was given ASA 324 mg and a therapeutic dose of Lovenox in ED   - Plan to continue cardiac monitoring, obtain serial troponin measurements, repeat EKG in am, check LE venous dopplers and V/Q scan   2. CKD stage IV  - SCr is 2.17 on admission, similar to priors  - Renally-dose medications    3. Chronic diastolic CHF  - Appears compensated  - Continue diuretics and ARB, hold beta-blocker initially given concern for acute PE, follow daily wts    4. Type II DM  - A1c was 6.8% in May  - Managed at her ALF with Trulicity, glipizide, Tresiba, and Tradjenta, held on admission  - Check CBG's and use Lantus with sliding-scale Novolog while in hospital    5. Schizophrenia  - Continue risperidone   6. Right foot ulcer  - No evidence for acute infection  - Continue wound care    7. Anemia  - Hgb is 9.0 on admission, similar to priors  - No active bleeding, monitor closely while  starting anticoagulation    8. Asthma  - Continue ICS/LABA, Singulair, and as-needed albuterol   9. Chronic pain  - Continue home-regimen with Cymbalta, gabapentin, and as-needed Norco    DVT prophylaxis: treatment-dose Lovenox  Code Status: Full  Family Communication: Discussed with patient  Consults called: None Admission status: Observation     Vianne Bulls, MD [facility] Pager [phone]  If 7PM-7AM, please contact night-coverage [email or web address] Password [facility]  07/14/2018, 9:51 PM

## 2018-07-14 NOTE — ED Provider Notes (Signed)
[facility] EMERGENCY DEPARTMENT Provider Note   CSN: [id number] Arrival date & time: 07/14/18  2022     History   Chief Complaint Chief Complaint  Patient presents with  . Chest Pain    HPI [name] is a 67 y.o. female.  HPI  67 year old female, multiple medical problems including congestive heart failure, chronic kidney disease stage III, COPD, last echocardiogram was from May showing grade 1 diastolic dysfunction with a normal ejection fraction.  The patient had a pulmonary embolism 9 years ago in 2010 and since that time is had to V/Q scans both of which were read as negative.  She no longer takes anticoagulants.  She has never had a heart attack and currently lives in an assisted care facility where she was this evening when she developed acute onset of left-sided chest pain which is sharp and stabbing, radiating to the back but not to the shoulder the neck or the jaw.  It is worse with deep breathing, worse with position and associated with swelling in the lower extremities.  The patient has been unable to have CT scans with contrast secondary to her renal dysfunction in the past.  She states that she feels short of breath and is much is taking a breath hurts but denies fevers coughing abdominal pain vomiting or diarrhea.  She does endorse having chronic swelling in her legs.  Her last visit with the cardiologist was on 20 November at which time the patient endorsed doing well with regards to shortness of breath and chest pain.  This changed acutely 2 hours ago when she had acute onset of symptoms.  The patient has had some poorly healing wounds on her right foot and that she keeps her right leg immobilized in a walking boot and is very immobile.  Past Medical History:  Diagnosis Date  . Anxiety   . Asthma   . Back pain   . Barrett esophagus   . CHF (congestive heart failure) ([place])   . CKD (chronic kidney disease) stage 3, GFR 30-59 ml/min (HCC)   . COPD (chronic  obstructive pulmonary disease) ([place])   . Depression   . Diastolic heart failure ([place])   . Essential hypertension   . Fibromyalgia   . GERD (gastroesophageal reflux disease)   . Gout   . History of pericarditis   . Hyperlipidemia   . Hypothyroid   . Major depressive disorder   . Memory loss   . Nephrolithiasis    2008  . Obesity hypoventilation syndrome (HCC)    CPAP  . Obstructive sleep apnea   . PE (pulmonary embolism)    2010  . Peripheral neuropathy   . PTSD (post-traumatic stress disorder)   . Rheumatoid arthritis ([place])   . Schizophrenia ([place])   . Steroid dependence   . Type 2 diabetes mellitus ([place])   . Vitamin D deficiency     Patient Active Problem List   Diagnosis Date Noted  . Type 2 diabetes mellitus ([place]) 04/19/2018  . Acute kidney injury superimposed on chronic kidney disease ([place]) 01/13/2018  . Altered mental status   . Encephalopathy 10/11/2016  . Chest pain 08/12/2016  . CKD (chronic kidney disease) stage 3, GFR 30-59 ml/min (HCC) 08/12/2016  . History of pulmonary embolus (PE) 08/12/2016  . Obesity hypoventilation syndrome ([place]) 08/12/2016  . RBBB 08/12/2016  . Positive D dimer 08/12/2016  . CVA (cerebral infarction) 11/10/2015  . Physical deconditioning 07/01/2011  . Weakness 07/01/2011  . Diabetic foot ulcer ([place])  05/19/2011  . Polypharmacy 02/09/2011  . BACK PAIN WITH RADICULOPATHY 05/25/2010  . Type 2 diabetes mellitus with diabetic polyneuropathy, without long-term current use of insulin ([place]) 08/18/2009  . Chronic diastolic heart failure ([place]) 04/25/2009  . Anemia 04/14/2009  . Sleep apnea 12/11/2008  . Diabetic peripheral neuropathy ([place]) 05/04/2007  . HYPERCHOLESTEROLEMIA 10/13/2006  . Gout, unspecified 10/13/2006  . HYPOKALEMIA 10/13/2006  . Schizophrenia ([place]) 10/13/2006  . Depression 10/13/2006  . Essential hypertension 10/13/2006  . Venous (peripheral) insufficiency 10/13/2006  . RHINITIS, ALLERGIC 10/13/2006  . Asthma 10/13/2006    . Reflux esophagitis 10/13/2006  . OSTEOARTHRITIS, MULTI SITES 10/13/2006  . INCONTINENCE, URGE 10/13/2006    Past Surgical History:  Procedure Laterality Date  . APPENDECTOMY    . CHOLECYSTECTOMY    . COLONOSCOPY     benign polyps (12/2000), repeat colonoscopy performed in 2007  . ENDOMETRIAL BIOPSY     10/03 benign columnar mucosa (limited material)  . KIDNEY SURGERY       OB History   None      Home Medications    Prior to Admission medications   Medication Sig Start Date End Date Taking? Authorizing Provider  acetaminophen (TYLENOL) 325 MG tablet Take 325 mg by mouth 2 (two) times daily.     [provider]  albuterol (PROVENTIL HFA;VENTOLIN HFA) 108 (90 Base) MCG/ACT inhaler Inhale 2 puffs into the lungs every 6 (six) hours as needed for wheezing or shortness of breath.    [provider]  albuterol (PROVENTIL) (2.5 MG/3ML) 0.083% nebulizer solution Take 2.5 mg by nebulization every 6 (six) hours as needed for wheezing or shortness of breath.    [provider]  aspirin 81 MG tablet Take 81 mg by mouth daily.    [provider]  atorvastatin (LIPITOR) 40 MG tablet Take 40 mg by mouth daily.     [provider]  budesonide-formoterol (SYMBICORT) 160-4.5 MCG/ACT inhaler Inhale 2 puffs into the lungs 2 (two) times daily.    [provider]  carvedilol (COREG) 25 MG tablet Take 25 mg by mouth 2 (two) times daily with a meal.    [provider]  Cholecalciferol (VITAMIN D) 2000 units CAPS Take 4,000 Units by mouth daily.     [provider]  docusate sodium (COLACE) 100 MG capsule Take 100 mg by mouth daily.     [provider]  doxycycline (VIBRAMYCIN) 100 MG capsule Take 1 capsule (100 mg total) by mouth 2 (two) times daily. 04/27/18   Noemi Chapel, MD  DULoxetine (CYMBALTA) 60 MG capsule Take 60 mg by mouth daily.    [provider]  EPINEPHrine (EPI-PEN) 0.3 mg/0.3 mL DEVI Inject 0.3 mg  into the muscle once.    [provider]  gabapentin (NEURONTIN) 300 MG capsule Take 300-600 mg by mouth See admin instructions. 300mg  twice daily and 600mg  at bedtime 04/29/17   [provider]  glipiZIDE (GLUCOTROL XL) 5 MG 24 hr tablet Take 5 mg by mouth daily with breakfast.     [provider]  hydrocortisone 2.5 % ointment Apply 1 application topically daily as needed (itchy skin).    [provider]  hydrOXYzine (VISTARIL) 25 MG capsule Take 25 mg by mouth 2 (two) times daily as needed for anxiety.     [provider]  levothyroxine (SYNTHROID, LEVOTHROID) 25 MCG tablet Take 25 mcg by mouth daily before breakfast.    [provider]  linagliptin (TRADJENTA) 5 MG TABS tablet Take  5 mg by mouth daily.    [provider]  loperamide (IMODIUM) 2 MG capsule Take 2 mg by mouth every 8 (eight) hours as needed for diarrhea or loose stools.     [provider]  LORazepam (ATIVAN) 1 MG tablet Take 1 mg by mouth daily as needed for anxiety.     [provider]  losartan (COZAAR) 100 MG tablet Take 100 mg by mouth daily.    [provider]  montelukast (SINGULAIR) 10 MG tablet Take 10 mg by mouth at bedtime.    [provider]  Multiple Vitamin (DAILY VITE PO) Take 1 tablet by mouth daily.    [provider]  omeprazole (PRILOSEC) 20 MG capsule Take 20 mg by mouth daily.     [provider]  prazosin (MINIPRESS) 2 MG capsule Take 2 mg by mouth at bedtime. For PTSD associated nightmares    [provider]  pyridOXINE (VITAMIN B-6) 100 MG tablet Take 200 mg by mouth 2 (two) times daily.    [provider]  risperiDONE (RISPERDAL) 0.5 MG tablet Take 0.5 mg by mouth 2 (two) times daily.     [provider]  spironolactone (ALDACTONE) 25 MG tablet Take 1 tablet (25 mg total) by mouth daily. 04/12/18   Mesner, Corene Cornea, MD  torsemide (DEMADEX) 10 MG tablet Take 40 mg by  mouth daily.  04/29/17   [provider]  traZODone (DESYREL) 100 MG tablet Take 100 mg by mouth at bedtime as needed for sleep.     [provider]    Family History Family History  Problem Relation Age of Onset  . Bone cancer Mother   . Hypertension Mother   . Heart disease Mother   . COPD Father   . Heart disease Father   . Stroke Father     Social History Social History   Tobacco Use  . Smoking status: Former Smoker    Types: Cigarettes    Last attempt to quit: 06/23/1972    Years since quitting: 46.0  . Smokeless tobacco: Never Used  Substance Use Topics  . Alcohol use: No  . Drug use: No     Allergies   Benztropine mesylate; Codeine; Diazepam; Diphenhydramine hcl; Penicillins; Sulfonamide derivatives; Thioridazine hcl; and Lisinopril   Review of Systems Review of Systems  All other systems reviewed and are negative.    Physical Exam Updated Vital Signs BP (!) 168/75 (BP Location: Right Arm)   Pulse 77   Temp 98.1 F (36.7 C) (Oral)   Resp 19   Ht 1.524 m (5')   Wt 101.2 kg   SpO2 99%   BMI 43.55 kg/m   Physical Exam  Constitutional: She appears well-developed and well-nourished. No distress.  HENT:  Head: Normocephalic and atraumatic.  Mouth/Throat: Oropharynx is clear and moist. No oropharyngeal exudate.  Eyes: Pupils are equal, round, and reactive to light. Conjunctivae and EOM are normal. Right eye exhibits no discharge. Left eye exhibits no discharge. No scleral icterus.  Neck: Normal range of motion. Neck supple. No JVD present. No thyromegaly present.  Cardiovascular: Normal rate, regular rhythm and intact distal pulses. Exam reveals no gallop and no friction rub.  Murmur ( soft systolic) heard. Pulmonary/Chest: Effort normal and breath sounds normal. No respiratory distress. She has no wheezes. She has no rales.  Abdominal: Soft. Bowel sounds are normal. She exhibits no distension and no mass. There is no tenderness.    Musculoskeletal: Normal range of motion. She  exhibits edema ( R>L ). She exhibits no tenderness.  Lymphadenopathy:    She has no cervical adenopathy.  Neurological: She is alert. Coordination normal.  Skin: Skin is warm and dry. No rash noted. No erythema.  Psychiatric: She has a normal mood and affect. Her behavior is normal.  Nursing note and vitals reviewed.    ED Treatments / Results  Labs (all labs ordered are listed, but only abnormal results are displayed) Labs Reviewed  D-DIMER, QUANTITATIVE (NOT AT [facility]) - Abnormal; Notable for the following components:      Result Value   D-Dimer, Quant 1.66 (*)    All other components within normal limits  CBC WITH DIFFERENTIAL/PLATELET - Abnormal; Notable for the following components:   RBC 2.87 (*)    Hemoglobin 9.0 (*)    HCT 27.9 (*)    All other components within normal limits  BASIC METABOLIC PANEL - Abnormal; Notable for the following components:   Glucose, Bld 250 (*)    BUN 38 (*)    Creatinine, Ser 2.17 (*)    Calcium 8.8 (*)    GFR calc non Af Amer 23 (*)    GFR calc Af Amer 27 (*)    All other components within normal limits  TROPONIN I    EKG EKG Interpretation  Date/Time:  Friday July 14 2018 20:27:58 EST Ventricular Rate:  80 PR Interval:    QRS Duration: 166 QT Interval:  425 QTC Calculation: 491 R Axis:   -50 Text Interpretation:  Ectopic atrial rhythm Borderline short PR interval RBBB and LAFB Left ventricular hypertrophy since last tracing no significant change 04/18/18 Confirmed by Noemi Chapel [phone]) on 07/14/2018 8:45:45 PM   Radiology Dg Chest Port 1 View  Result Date: 07/14/2018 CLINICAL DATA:  Chest pain and shortness of breath EXAM: PORTABLE CHEST 1 VIEW COMPARISON:  04/18/2018 FINDINGS: There is shallow lung inflation with pulmonary vascular congestion. Mild cardiomegaly. No pleural effusion or pneumothorax. No focal airspace consolidation. IMPRESSION: Cardiomegaly and pulmonary vascular  congestion. Electronically Signed   By: Ulyses Jarred M.D.   On: 07/14/2018 21:19    Procedures Procedures (including critical care time)  Medications Ordered in ED Medications  aspirin chewable tablet 324 mg (324 mg Oral Not Given 07/14/18 2052)  enoxaparin (LOVENOX) injection 100 mg (has no administration in time range)     Initial Impression / Assessment and Plan / ED Course  I have reviewed the triage vital signs and the nursing notes.  Pertinent labs & imaging results that were available during my care of the patient were reviewed by me and considered in my medical decision making (see chart for details).  Clinical Course as of Jul 15 2143  Ludwig Clarks Jul 14, 2018  2122 On November 6 the patient underwent arterial evaluation of her legs without any significant arterial occlusion on vascular ultrasound.  Today her d-dimer is elevated, her hemoglobin is 9, this is close to her baseline with her hemoglobin.   [BM]    Clinical Course User Index [BM] Noemi Chapel, MD    I am concerned that the patient may have recurrent pulmonary embolism, she has sharp pleuritic chest pain worse with breathing and movement, asymmetry to the legs and a relative immobilization of her foot on top of being very docile at baseline.  She has no objective tachypnea tachycardia or hypoxia and her EKG is unchanged.  She has no known history of obstructive coronary disease though I do not see that she has had a  heart catheterization in her file.  She will need evaluation for pulmonary embolism.  Paged hospitalist for admission at 9:35 PM D/w Dr. Myna Hidalgo who will admit   Final Clinical Impressions(s) / ED Diagnoses   Final diagnoses:  Left-sided chest pain     Noemi Chapel, MD 07/14/18 2144

## 2018-07-15 ENCOUNTER — Observation Stay (HOSPITAL_COMMUNITY): Payer: Medicare Other

## 2018-07-15 ENCOUNTER — Encounter (HOSPITAL_COMMUNITY): Payer: Self-pay

## 2018-07-15 DIAGNOSIS — E1142 Type 2 diabetes mellitus with diabetic polyneuropathy: Secondary | ICD-10-CM | POA: Diagnosis not present

## 2018-07-15 DIAGNOSIS — L97519 Non-pressure chronic ulcer of other part of right foot with unspecified severity: Secondary | ICD-10-CM | POA: Diagnosis not present

## 2018-07-15 DIAGNOSIS — R0781 Pleurodynia: Secondary | ICD-10-CM | POA: Diagnosis not present

## 2018-07-15 DIAGNOSIS — R7989 Other specified abnormal findings of blood chemistry: Secondary | ICD-10-CM | POA: Diagnosis not present

## 2018-07-15 DIAGNOSIS — J449 Chronic obstructive pulmonary disease, unspecified: Secondary | ICD-10-CM | POA: Diagnosis not present

## 2018-07-15 DIAGNOSIS — F209 Schizophrenia, unspecified: Secondary | ICD-10-CM | POA: Diagnosis not present

## 2018-07-15 DIAGNOSIS — Z86711 Personal history of pulmonary embolism: Secondary | ICD-10-CM | POA: Diagnosis not present

## 2018-07-15 DIAGNOSIS — N184 Chronic kidney disease, stage 4 (severe): Secondary | ICD-10-CM | POA: Diagnosis not present

## 2018-07-15 DIAGNOSIS — E11621 Type 2 diabetes mellitus with foot ulcer: Secondary | ICD-10-CM | POA: Diagnosis not present

## 2018-07-15 LAB — TROPONIN I
Troponin I: <0.03 ng/mL (ref <0.03)
Troponin I: <0.03 ng/mL (ref <0.03)

## 2018-07-15 LAB — GLUCOSE, CAPILLARY
Glucose-Capillary: 145 mg/dL — ABNORMAL HIGH (ref 70–99)
Glucose-Capillary: 120 mg/dL — ABNORMAL HIGH (ref 70–99)

## 2018-07-15 MED ORDER — TECHNETIUM TC 99M DIETHYLENETRIAME-PENTAACETIC ACID
30.0000 | Freq: Once | INTRAVENOUS | Status: AC | PRN
  Administered 2018-07-15: 30.1 via RESPIRATORY_TRACT

## 2018-07-15 MED ORDER — TECHNETIUM TO 99M ALBUMIN AGGREGATED
4.0000 | Freq: Once | INTRAVENOUS | Status: AC | PRN
  Administered 2018-07-15: 4 via INTRAVENOUS

## 2018-07-15 NOTE — Discharge Summary (Signed)
Physician Discharge Summary  [name] JOI:[id number] DOB: 1950-11-15 DOA: 07/14/2018  PCP: Glenda Chroman, MD  Admit date: 07/14/2018 Discharge date: 07/15/2018  Time spent: 35 minutes  Recommendations for Outpatient Follow-up:  1. Repeat basic metabolic panel to follow electrolytes and renal function 2. Repeat CBC to follow hemoglobin trend   Discharge Diagnoses:  Principal Problem:   Pleuritic chest pain Active Problems:   Type 2 diabetes mellitus with diabetic polyneuropathy, without long-term current use of insulin (HCC)   Normocytic anemia   Schizophrenia (HCC)   Asthma   Diabetic foot ulcer (HCC)   CKD (chronic kidney disease), stage IV (HCC)   History of pulmonary embolus (PE)   Obesity, Class III, BMI 40-49.9 (morbid obesity) ([place])   Positive D dimer   Discharge Condition: Stable and improved.  Patient has been discharged home with instruction to follow-up with PCP in 10 days and to pursued visit with cardiology service as previously scheduled.  Diet recommendation: Low calorie, modified carbohydrates and low-sodium diet.  Filed Weights   07/14/18 2027 07/14/18 2315  Weight: 101.2 kg 103.7 kg    History of present illness:  67 y.o. female with medical history significant for schizophrenia, chronic diastolic CHF, chronic kidney disease stage IV, asthma, chronic anemia, chronic pain, and history of PE in 2010 no longer on anticoagulation, now presenting to the emergency department for evaluation of acute onset of chest pain and shortness of breath.  Patient reports that she was working on a puzzle at her assisted living facility when she developed the acute onset of pleuritic chest pain and dyspnea.  She denies any fevers, chills, or cough associated with this.  Pain has been constant, moderate to severe in intensity, sharp in character, localized to the left anterior chest, and without any appreciable alleviating or exacerbating factors identified.  She has a poorly  healing ulcer on her right foot and is wearing an orthopedic boot.  ED Course: Upon arrival to the ED, patient is found to be afebrile, saturating well on room air, hypertensive, and vitals otherwise normal.  EKG features of ectopic atrial rhythm with RBBB, LAFB, and LVH, similar to prior.  Chest x-ray is notable for cardiomegaly and vascular congestion.  Chemistry panel features a glucose of 250 and creatinine 2.17, similar to priors.  CBC is notable for a chronic stable normocytic anemia with hemoglobin of 9.0.  Troponin is undetectable.  D-dimer is elevated to 1.66.  Patient was given 324 mg of aspirin and 1 mg/kg of Lovenox in the ED.  She remains hemodynamically stable and will be observed for ongoing evaluation and management of pleuritic chest pain with elevated d-dimer and history of PE no longer on anticoagulation.  Hospital Course:  1-pleuritic chest pain/positive d-dimer -Patient with a prior history of pulmonary embolism multiple years ago -VQ scan with a fairly low probability for PE; lower extremity Dopplers negative for DVT. -No need for anticoagulation therapy -Patient has also rule out for any ACS component, with negative troponin x3 and no acute ischemic changes on telemetry or EKG -At discharge patient was no having any further chest discomfort or shortness of breath -Symptoms most likely musculoskeletal in nature. -Continue the use of analgesics as needed.  2-stage IV chronic kidney disease -Serum creatinine 2.17 which is similar to her baseline -Patient reports good urine output and denies any dysuria -Advised to keep yourself well-hydrated -Repeat basic metabolic panel at follow-up visit to reassess renal function trend.  3-chronic diastolic heart failure -Negative orthopnea and good  oxygen saturation on room air -Patient was advised to check her weight on daily basis and to follow low-sodium diet -Resume ARB, beta-blocker, spironolactone and Demadex as previously  prescribed by cardiology service -Continue outpatient follow-up as scheduled.  4-morbid obesity -Body mass index is 44.65 kg/m. -Low calorie diet, portion control and increase physical activity has been discussed with patient.  5-type 2 diabetes -Last A1c 6.8 -Continue oral hypoglycemic regimen and further adjust as per primary care physician -Patient using Trulicity, glipizide, Tyler Aas and Tradjenta.  6-schizophrenia -Mood stable -No hallucination -Continue risperidone.  7-right foot ulcer -There is no evidence of acute superimposed infection -Continue wound care -Patient with clean dressings and orthopedic boot in place.  8-anemia of chronic disease -Hemoglobin is at baseline  -No signs of active bleeding -Repeat CBC at follow-up visit to reassess hemoglobin trend.  9-history of asthma -Continue ICS/LABA, Singulair and as needed albuterol. -No wheezing and good oxygen saturation on room air appreciated during exam.  10-history of chronic pain -Resume home analgesic regimen. -Patient on Cymbalta, gabapentin and PRN Norco  11-GERD -Continue PPI.   Procedures:  Lower extremity Dopplers: Negative for DVT  VQ scan: Very low probability for pulmonary embolism.  Consultations:  None  Discharge Exam: Vitals:   07/14/18 2314 07/15/18 0558  BP: (!) 149/61 (!) 146/63  Pulse: 80 77  Resp: 20 16  Temp: 98 F (36.7 C) 98.5 F (36.9 C)  SpO2: 100% 95%    General: Afebrile, no nausea, no vomiting, no chest pain, denies shortness of breath.  Patient laying flat in no acute distress and denying orthopnea. Cardiovascular: S1 and S2, no rubs, no gallops; unable to assess JVD with body habitus. Respiratory: Good air movement bilaterally, no wheezing, no crackles.  Normal respiratory effort Abdomen: Obese, soft, nontender, nondistended, positive bowel sounds Extremities: Right foot with clean dressings and orthopedic boot in place, no cyanosis or clubbing; patient with  1+ edema bilaterally.  Discharge Instructions   Discharge Instructions    (HEART FAILURE PATIENTS) Call MD:  Anytime you have any of the following symptoms: 1) 3 pound weight gain in 24 hours or 5 pounds in 1 week 2) shortness of breath, with or without a dry hacking cough 3) swelling in the hands, feet or stomach 4) if you have to sleep on extra pillows at night in order to breathe.   Complete by:  As directed    Diet - low sodium heart healthy   Complete by:  As directed    Discharge instructions   Complete by:  As directed    Take medications as prescribed Follow low-sodium diet (less than 2-2.5 g daily) Maintain adequate hydration Continue to follow instruction for wound care on your right food and maintain legs elevated as much as possible. Resume the use of TED hoses on your left leg with anticipated resumption on your right leg once ulcer/skin lesions resolved. Arrange follow-up with PCP 10 days Follow-up with cardiology service as previously scheduled.     Allergies as of 07/15/2018      Reactions   Benztropine Mesylate Other (See Comments)   REACTION: Alopecia and white spots on eyes   Codeine Other (See Comments)   REACTION: Mouth Swelling - Tachycardia   Diazepam Other (See Comments)   REACTION: COMA   Diphenhydramine Hcl Other (See Comments)   REACTION: Tachycardia , anxiousness   Penicillins Other (See Comments)   REACTION: ulcers in mouth, swelling, throat swelling  Has patient had a PCN reaction causing immediate rash,  facial/tongue/throat swelling, SOB or lightheadedness with hypotension: YES Has patient had a PCN reaction causing severe rash involving Mucus membranes or skin necrosis: Unknown Has patient had a PCN reaction that required hospitalization Unknown Has patient had a PCN reaction occurring within the last 10 years: unknown If all of the above answers are "NO", then may proceed with Cephalospori   Sulfonamide Derivatives Other (See Comments)    REACTION: rash - blisters   Thioridazine Hcl Other (See Comments)   REACTION: swelling   Lisinopril Other (See Comments)   REACTION: cough      Medication List    TAKE these medications   acetaminophen 325 MG tablet Commonly known as:  TYLENOL Take 325 mg by mouth 2 (two) times daily.   albuterol 108 (90 Base) MCG/ACT inhaler Commonly known as:  PROVENTIL HFA;VENTOLIN HFA Inhale 2 puffs into the lungs every 6 (six) hours as needed for wheezing or shortness of breath.   albuterol (2.5 MG/3ML) 0.083% nebulizer solution Commonly known as:  PROVENTIL Take 2.5 mg by nebulization every 6 (six) hours as needed for wheezing or shortness of breath.   aspirin 81 MG tablet Take 81 mg by mouth daily.   atorvastatin 40 MG tablet Commonly known as:  LIPITOR Take 40 mg by mouth daily.   budesonide-formoterol 160-4.5 MCG/ACT inhaler Commonly known as:  SYMBICORT Inhale 2 puffs into the lungs 2 (two) times daily.   carvedilol 25 MG tablet Commonly known as:  COREG Take 25 mg by mouth 2 (two) times daily with a meal.   DAILY VITE PO Take 1 tablet by mouth daily.   docusate sodium 100 MG capsule Commonly known as:  COLACE Take 100 mg by mouth daily.   doxycycline 100 MG capsule Commonly known as:  VIBRAMYCIN Take 1 capsule (100 mg total) by mouth 2 (two) times daily.   DULoxetine 60 MG capsule Commonly known as:  CYMBALTA Take 60 mg by mouth daily.   EPINEPHrine 0.3 mg/0.3 mL Devi Commonly known as:  EPI-PEN Inject 0.3 mg into the muscle once.   gabapentin 300 MG capsule Commonly known as:  NEURONTIN Take 300-600 mg by mouth See admin instructions. 300mg  twice daily and 600mg  at bedtime   glipiZIDE 5 MG 24 hr tablet Commonly known as:  GLUCOTROL XL Take 5 mg by mouth 2 (two) times daily.   HYDROcodone-acetaminophen 5-325 MG tablet Commonly known as:  NORCO/VICODIN Take 1 tablet by mouth every 8 (eight) hours as needed for moderate pain.   hydrocortisone 2.5 %  ointment Apply 1 application topically daily as needed (itchy skin).   hydrOXYzine 25 MG capsule Commonly known as:  VISTARIL Take 25 mg by mouth 2 (two) times daily as needed for anxiety.   levothyroxine 25 MCG tablet Commonly known as:  SYNTHROID, LEVOTHROID Take 25 mcg by mouth daily before breakfast.   linagliptin 5 MG Tabs tablet Commonly known as:  TRADJENTA Take 5 mg by mouth daily.   loperamide 2 MG capsule Commonly known as:  IMODIUM Take 2 mg by mouth every 8 (eight) hours as needed for diarrhea or loose stools.   LORazepam 1 MG tablet Commonly known as:  ATIVAN Take 1 mg by mouth daily as needed for anxiety.   losartan 100 MG tablet Commonly known as:  COZAAR Take 100 mg by mouth daily.   montelukast 10 MG tablet Commonly known as:  SINGULAIR Take 10 mg by mouth at bedtime.   omeprazole 20 MG capsule Commonly known as:  PRILOSEC Take 20  mg by mouth daily.   prazosin 2 MG capsule Commonly known as:  MINIPRESS Take 2 mg by mouth at bedtime. For PTSD associated nightmares   pyridOXINE 100 MG tablet Commonly known as:  VITAMIN B-6 Take 200 mg by mouth 2 (two) times daily.   risperiDONE 0.5 MG tablet Commonly known as:  RISPERDAL Take 0.5 mg by mouth 2 (two) times daily.   spironolactone 25 MG tablet Commonly known as:  ALDACTONE Take 1 tablet (25 mg total) by mouth daily.   torsemide 20 MG tablet Commonly known as:  DEMADEX Take 40 mg by mouth daily.   traZODone 100 MG tablet Commonly known as:  DESYREL Take 100 mg by mouth at bedtime as needed for sleep.   TRESIBA FLEXTOUCH 100 UNIT/ML Sopn FlexTouch Pen Generic drug:  insulin degludec Inject 20 Units into the skin at bedtime.   TRULICITY 1.5 ID/7.8EU Sopn Generic drug:  Dulaglutide Inject 1.5 mg into the skin once a week.   Vitamin D 50 MCG (2000 UT) Caps Take 4,000 Units by mouth daily.      Allergies  Allergen Reactions  . Benztropine Mesylate Other (See Comments)    REACTION:  Alopecia and white spots on eyes  . Codeine Other (See Comments)    REACTION: Mouth Swelling - Tachycardia  . Diazepam Other (See Comments)    REACTION: COMA  . Diphenhydramine Hcl Other (See Comments)    REACTION: Tachycardia , anxiousness  . Penicillins Other (See Comments)    REACTION: ulcers in mouth, swelling, throat swelling  Has patient had a PCN reaction causing immediate rash, facial/tongue/throat swelling, SOB or lightheadedness with hypotension: YES Has patient had a PCN reaction causing severe rash involving Mucus membranes or skin necrosis: Unknown Has patient had a PCN reaction that required hospitalization Unknown Has patient had a PCN reaction occurring within the last 10 years: unknown If all of the above answers are "NO", then may proceed with Cephalospori  . Sulfonamide Derivatives Other (See Comments)    REACTION: rash - blisters  . Thioridazine Hcl Other (See Comments)    REACTION: swelling  . Lisinopril Other (See Comments)    REACTION: cough    Contact information for follow-up providers    Vyas, Dhruv B, MD. Schedule an appointment as soon as possible for a visit in 10 day(s).   Specialty:  Internal Medicine Contact information: [place] [address] [phone]        Satira Sark, MD .   Specialty:  Cardiology Contact information: [place] [address] [phone]            Contact information for after-discharge care    Destination    [facility] .   Service:  Assisted Living Contact information: [address] [place] [phone]                  The results of significant diagnostics from this hospitalization (including imaging, microbiology, ancillary and laboratory) are listed below for reference.    Significant Diagnostic Studies: Nm Pulmonary Perf And Vent  Result Date: 07/15/2018 CLINICAL DATA:  Intermediate probability for  pulmonary embolus. Positive D-dimer. EXAM: NUCLEAR MEDICINE VENTILATION - PERFUSION LUNG SCAN TECHNIQUE: Ventilation images were obtained in multiple projections using inhaled aerosol Tc-88m DTPA. Perfusion images were obtained in multiple projections after intravenous injection of Tc-56m MAA. RADIOPHARMACEUTICALS:  30.1 mCi of Tc-67m DTPA aerosol inhalation and 4 mCi Tc40m MAA IV COMPARISON:  None. FINDINGS: Ventilation: There is a clumping of the radiopharmaceutical within the central airways compatible with turbulent air flow secondary to COPD. No wedge-shaped ventilation abnormalities identified. Perfusion: No wedge shaped peripheral perfusion defects to suggest acute pulmonary embolism. IMPRESSION: 1. No evidence for pulmonary emboli.  Very low probability. 2. COPD. Electronically Signed   By: Kerby Moors M.D.   On: 07/15/2018 10:17   US Venous Img Lower Bilateral  Result Date: 07/15/2018 CLINICAL DATA:  Elevated D-dimer EXAM: BILATERAL LOWER EXTREMITY VENOUS DUPLEX ULTRASOUND TECHNIQUE: Doppler venous assessment of the bilateral lower extremity deep venous system was performed, including characterization of spectral flow, compressibility, and plasticity. COMPARISON:  None. FINDINGS: There is complete compressibility of the bilateral common femoral, femoral, and popliteal veins. Doppler analysis demonstrates respiratory phasicity and augmentation of flow with calf compression. No obvious superficial vein or calf vein thrombosis. IMPRESSION: No evidence of lower extremity DVT Electronically Signed   By: Marybelle Killings M.D.   On: 07/15/2018 10:33   Dg Chest Port 1 View  Result Date: 07/14/2018 CLINICAL DATA:  Chest pain and shortness of breath EXAM: PORTABLE CHEST 1 VIEW COMPARISON:  04/18/2018 FINDINGS: There is shallow lung inflation with pulmonary vascular congestion. Mild cardiomegaly. No pleural effusion or pneumothorax. No focal airspace consolidation. IMPRESSION: Cardiomegaly and pulmonary  vascular congestion. Electronically Signed   By: Ulyses Jarred M.D.   On: 07/14/2018 21:19   Labs: Basic Metabolic Panel: Recent Labs  Lab 07/14/18 2056  NA 135  K 4.1  CL 103  CO2 24  GLUCOSE 250*  BUN 38*  CREATININE 2.17*  CALCIUM 8.8*   CBC: Recent Labs  Lab 07/14/18 2056  WBC 8.4  NEUTROABS 6.2  HGB 9.0*  HCT 27.9*  MCV 97.2  PLT 168   Cardiac Enzymes: Recent Labs  Lab 07/14/18 2056 07/15/18 0408 07/15/18 1018  TROPONINI <0.03 <0.03 <0.03   BNP: BNP (last 3 results) Recent Labs    01/12/18 1056  BNP 145.3*    CBG: Recent Labs  Lab 07/14/18 2247 07/15/18 0743 07/15/18 1159  GLUCAP 241* 145* 120*    Signed:  Barton Dubois MD.  [facility] 07/15/2018, 12:31 PM

## 2018-07-15 NOTE — Progress Notes (Signed)
Patient discharging back to [facility].  IV removed - WNL.  Reviewed AVS with patient and caregiver from facility - [place].  No changes to medications, instructed to FU with PCP  Verbalizes understanding, no questions at this time.

## 2018-07-15 NOTE — NC FL2 (Signed)
[place] LEVEL OF CARE SCREENING TOOL     IDENTIFICATION  Patient Name: [name] Birthdate: [birth year] Sex: female Admission Date (Current Location): 07/14/2018  [place] and [place] Number:  Engineering geologist and Address:  [facility], [address] [place], [place] [address]      Provider Number: [id number]  Attending Physician Name and Address:  Barton Dubois, MD  Relative Name and Phone Number:  Ardeen Garland, phone # [phone].    Current Level of Care: Hospital Recommended Level of Care: [place] Prior Approval Number:    Date Approved/Denied:   PASRR Number:    Discharge Plan: Domiciliary (Rest home)    Current Diagnoses: Patient Active Problem List   Diagnosis Date Noted  . Pleuritic chest pain 07/14/2018  . Moderate persistent asthma without complication   . Type 2 diabetes mellitus ([place]) 04/19/2018  . Acute kidney injury superimposed on chronic kidney disease ([place]) 01/13/2018  . Altered mental status   . Encephalopathy 10/11/2016  . Chest pain 08/12/2016  . CKD (chronic kidney disease), stage IV ([place]) 08/12/2016  . History of pulmonary embolus (PE) 08/12/2016  . Obesity, Class III, BMI 40-49.9 (morbid obesity) ([place]) 08/12/2016  . RBBB 08/12/2016  . Positive D dimer 08/12/2016  . CVA (cerebral infarction) 11/10/2015  . Physical deconditioning 07/01/2011  . Weakness 07/01/2011  . Diabetic foot ulcer ([place]) 05/19/2011  . Polypharmacy 02/09/2011  . BACK PAIN WITH RADICULOPATHY 05/25/2010  . Type 2 diabetes mellitus with diabetic polyneuropathy, without long-term current use of insulin ([place]) 08/18/2009  . Chronic diastolic heart failure ([place]) 04/25/2009  . Normocytic anemia 04/14/2009  . Sleep apnea 12/11/2008  . Diabetic peripheral neuropathy ([place]) 05/04/2007  . HYPERCHOLESTEROLEMIA 10/13/2006  . Gout, unspecified 10/13/2006  . HYPOKALEMIA 10/13/2006  . Schizophrenia ([place])  10/13/2006  . Depression 10/13/2006  . Essential hypertension 10/13/2006  . Venous (peripheral) insufficiency 10/13/2006  . RHINITIS, ALLERGIC 10/13/2006  . Asthma 10/13/2006  . Reflux esophagitis 10/13/2006  . OSTEOARTHRITIS, MULTI SITES 10/13/2006  . INCONTINENCE, URGE 10/13/2006    Orientation RESPIRATION BLADDER Height & Weight     Self, Place  Normal Incontinent Weight: 228 lb 9.9 oz (103.7 kg) Height:  5' (152.4 cm)  BEHAVIORAL SYMPTOMS/MOOD NEUROLOGICAL BOWEL NUTRITION STATUS      Incontinent Diet(Heart healthy)  AMBULATORY STATUS COMMUNICATION OF NEEDS Skin   Limited Assist Verbally Skin abrasions                       Personal Care Assistance Level of Assistance  Bathing, Feeding, Dressing Bathing Assistance: Limited assistance Feeding assistance: Independent Dressing Assistance: Limited assistance     Functional Limitations Info  Sight, Hearing, Speech Sight Info: Adequate Hearing Info: Adequate Speech Info: Adequate    SPECIAL CARE FACTORS FREQUENCY                       Contractures Contractures Info: Not present    Additional Factors Info  Code Status, Allergies Code Status Info: Full Allergies Info: Benztropine Mesylate, Codeine, Diazepam, Diphenhydramine Hcl, Penicillins, Sulfonamide Derivatives, Thioridazine Hcl, Lisinopril           Current Medications (07/15/2018):  This is the current hospital active medication list Current Facility-Administered Medications  Medication Dose Route Frequency Provider Last Rate Last Dose  . 0.9 %  sodium chloride infusion  250 mL Intravenous PRN Opyd, Ilene Qua, MD      . acetaminophen (TYLENOL) tablet 650 mg  650 mg Oral Q4H PRN Opyd, Ilene Qua, MD      . albuterol (PROVENTIL) (2.5 MG/3ML) 0.083% nebulizer solution 2.5 mg  2.5 mg Nebulization Q6H PRN Opyd, Ilene Qua, MD      . aspirin chewable tablet 324 mg  324 mg Oral Once Opyd, Ilene Qua, MD      . aspirin chewable tablet 81 mg  81 mg Oral Daily  Opyd, Ilene Qua, MD   81 mg at 07/15/18 0819  . atorvastatin (LIPITOR) tablet 40 mg  40 mg Oral q1800 Opyd, Ilene Qua, MD      . docusate sodium (COLACE) capsule 100 mg  100 mg Oral Daily Opyd, Ilene Qua, MD   100 mg at 07/15/18 0817  . doxycycline (VIBRA-TABS) tablet 100 mg  100 mg Oral BID Opyd, Ilene Qua, MD   100 mg at 07/15/18 0818  . DULoxetine (CYMBALTA) DR capsule 60 mg  60 mg Oral Daily Opyd, Ilene Qua, MD   60 mg at 07/15/18 0820  . enoxaparin (LOVENOX) injection 100 mg  1 mg/kg Subcutaneous Q24H Opyd, Ilene Qua, MD      . gabapentin (NEURONTIN) capsule 300 mg  300 mg Oral BID Opyd, Ilene Qua, MD   300 mg at 07/15/18 6387   And  . gabapentin (NEURONTIN) capsule 600 mg  600 mg Oral QHS Opyd, Ilene Qua, MD   600 mg at 07/14/18 2235  . HYDROcodone-acetaminophen (NORCO/VICODIN) 5-325 MG per tablet 1 tablet  1 tablet Oral Q8H PRN Opyd, Ilene Qua, MD      . hydrOXYzine (ATARAX/VISTARIL) tablet 25 mg  25 mg Oral BID PRN Opyd, Ilene Qua, MD      . insulin aspart (novoLOG) injection 0-5 Units  0-5 Units Subcutaneous QHS Opyd, Ilene Qua, MD   2 Units at 07/14/18 2254  . insulin aspart (novoLOG) injection 0-9 Units  0-9 Units Subcutaneous TID WC Opyd, Ilene Qua, MD   1 Units at 07/15/18 0816  . insulin glargine (LANTUS) injection 10 Units  10 Units Subcutaneous QHS Vianne Bulls, MD   10 Units at 07/14/18 2342  . levothyroxine (SYNTHROID, LEVOTHROID) tablet 25 mcg  25 mcg Oral Q0600 Vianne Bulls, MD   25 mcg at 07/15/18 0541  . losartan (COZAAR) tablet 100 mg  100 mg Oral Daily Opyd, Ilene Qua, MD   100 mg at 07/15/18 0817  . mometasone-formoterol (DULERA) 200-5 MCG/ACT inhaler 2 puff  2 puff Inhalation BID Opyd, Ilene Qua, MD      . montelukast (SINGULAIR) tablet 10 mg  10 mg Oral QHS Opyd, Ilene Qua, MD   10 mg at 07/14/18 2235  . morphine 4 MG/ML injection 3 mg  3 mg Intravenous Q4H PRN Opyd, Ilene Qua, MD      . nitroGLYCERIN (NITROSTAT) SL tablet 0.4 mg  0.4 mg Sublingual Q5 Min x 3 PRN  Opyd, Ilene Qua, MD      . ondansetron (ZOFRAN) injection 4 mg  4 mg Intravenous Q6H PRN Opyd, Ilene Qua, MD      . pantoprazole (PROTONIX) EC tablet 40 mg  40 mg Oral Daily Opyd, Ilene Qua, MD   40 mg at 07/15/18 0817  . prazosin (MINIPRESS) capsule 2 mg  2 mg Oral QHS Opyd, Ilene Qua, MD   2 mg at 07/14/18 2350  . risperiDONE (RISPERDAL) tablet 0.5 mg  0.5 mg Oral BID Opyd, Ilene Qua, MD   0.5 mg at 07/15/18 0819  . sodium chloride flush (NS) 0.9 % injection  3 mL  3 mL Intravenous Q12H Opyd, Ilene Qua, MD   3 mL at 07/15/18 0820  . sodium chloride flush (NS) 0.9 % injection 3 mL  3 mL Intravenous PRN Opyd, Ilene Qua, MD      . spironolactone (ALDACTONE) tablet 25 mg  25 mg Oral Daily Opyd, Ilene Qua, MD   25 mg at 07/15/18 0819  . torsemide (DEMADEX) tablet 40 mg  40 mg Oral Daily Opyd, Ilene Qua, MD   40 mg at 07/15/18 0818  . traZODone (DESYREL) tablet 100 mg  100 mg Oral QHS PRN Opyd, Ilene Qua, MD         Discharge Medications: Medication List    TAKE these medications   acetaminophen 325 MG tablet Commonly known as:  TYLENOL Take 325 mg by mouth 2 (two) times daily.   albuterol 108 (90 Base) MCG/ACT inhaler Commonly known as:  PROVENTIL HFA;VENTOLIN HFA Inhale 2 puffs into the lungs every 6 (six) hours as needed for wheezing or shortness of breath.   albuterol (2.5 MG/3ML) 0.083% nebulizer solution Commonly known as:  PROVENTIL Take 2.5 mg by nebulization every 6 (six) hours as needed for wheezing or shortness of breath.   aspirin 81 MG tablet Take 81 mg by mouth daily.   atorvastatin 40 MG tablet Commonly known as:  LIPITOR Take 40 mg by mouth daily.   budesonide-formoterol 160-4.5 MCG/ACT inhaler Commonly known as:  SYMBICORT Inhale 2 puffs into the lungs 2 (two) times daily.   carvedilol 25 MG tablet Commonly known as:  COREG Take 25 mg by mouth 2 (two) times daily with a meal.   DAILY VITE PO Take 1 tablet by mouth daily.   docusate sodium 100 MG  capsule Commonly known as:  COLACE Take 100 mg by mouth daily.   doxycycline 100 MG capsule Commonly known as:  VIBRAMYCIN Take 1 capsule (100 mg total) by mouth 2 (two) times daily.   DULoxetine 60 MG capsule Commonly known as:  CYMBALTA Take 60 mg by mouth daily.   EPINEPHrine 0.3 mg/0.3 mL Devi Commonly known as:  EPI-PEN Inject 0.3 mg into the muscle once.   gabapentin 300 MG capsule Commonly known as:  NEURONTIN Take 300-600 mg by mouth See admin instructions. 300mg  twice daily and 600mg  at bedtime   glipiZIDE 5 MG 24 hr tablet Commonly known as:  GLUCOTROL XL Take 5 mg by mouth 2 (two) times daily.   HYDROcodone-acetaminophen 5-325 MG tablet Commonly known as:  NORCO/VICODIN Take 1 tablet by mouth every 8 (eight) hours as needed for moderate pain.   hydrocortisone 2.5 % ointment Apply 1 application topically daily as needed (itchy skin).   hydrOXYzine 25 MG capsule Commonly known as:  VISTARIL Take 25 mg by mouth 2 (two) times daily as needed for anxiety.   levothyroxine 25 MCG tablet Commonly known as:  SYNTHROID, LEVOTHROID Take 25 mcg by mouth daily before breakfast.   linagliptin 5 MG Tabs tablet Commonly known as:  TRADJENTA Take 5 mg by mouth daily.   loperamide 2 MG capsule Commonly known as:  IMODIUM Take 2 mg by mouth every 8 (eight) hours as needed for diarrhea or loose stools.   LORazepam 1 MG tablet Commonly known as:  ATIVAN Take 1 mg by mouth daily as needed for anxiety.   losartan 100 MG tablet Commonly known as:  COZAAR Take 100 mg by mouth daily.   montelukast 10 MG tablet Commonly known as:  SINGULAIR Take 10 mg by mouth  at bedtime.   omeprazole 20 MG capsule Commonly known as:  PRILOSEC Take 20 mg by mouth daily.   prazosin 2 MG capsule Commonly known as:  MINIPRESS Take 2 mg by mouth at bedtime. For PTSD associated nightmares   pyridOXINE 100 MG tablet Commonly known as:  VITAMIN B-6 Take 200 mg by mouth 2  (two) times daily.   risperiDONE 0.5 MG tablet Commonly known as:  RISPERDAL Take 0.5 mg by mouth 2 (two) times daily.   spironolactone 25 MG tablet Commonly known as:  ALDACTONE Take 1 tablet (25 mg total) by mouth daily.   torsemide 20 MG tablet Commonly known as:  DEMADEX Take 40 mg by mouth daily.   traZODone 100 MG tablet Commonly known as:  DESYREL Take 100 mg by mouth at bedtime as needed for sleep.   TRESIBA FLEXTOUCH 100 UNIT/ML Sopn FlexTouch Pen Generic drug:  insulin degludec Inject 20 Units into the skin at bedtime.   TRULICITY 1.5 GH/8.2XH Sopn Generic drug:  Dulaglutide Inject 1.5 mg into the skin once a week.   Vitamin D 50 MCG (2000 UT) Caps Take 4,000 Units by mouth daily.   Relevant Imaging Results:  Relevant Lab Results:   Additional Information [id number]  Zettie Pho, LCSW

## 2018-07-15 NOTE — Clinical Social Work Note (Addendum)
The CSW spoke with [facility] about the patient's return. The patient can return today, and the RN is to call the facility once the patient's documentation is ready and she is able to be retrieved by facility transport. The patient's legal guardian is the facility, so legal guardianship update has been entered into the discharge section to address that they have been notified of admittance and discharge.The CSW will print needed documentation to the department printer when available. At that time, the CSW will sign off. Please consult should needs arise.  Santiago Bumpers, MSW, Latanya Presser [phone]

## 2018-07-18 DIAGNOSIS — N183 Chronic kidney disease, stage 3 (moderate): Secondary | ICD-10-CM | POA: Diagnosis not present

## 2018-07-18 DIAGNOSIS — L97512 Non-pressure chronic ulcer of other part of right foot with fat layer exposed: Secondary | ICD-10-CM | POA: Diagnosis not present

## 2018-07-18 DIAGNOSIS — E1122 Type 2 diabetes mellitus with diabetic chronic kidney disease: Secondary | ICD-10-CM | POA: Diagnosis not present

## 2018-07-18 DIAGNOSIS — I5032 Chronic diastolic (congestive) heart failure: Secondary | ICD-10-CM | POA: Diagnosis not present

## 2018-07-18 DIAGNOSIS — I130 Hypertensive heart and chronic kidney disease with heart failure and stage 1 through stage 4 chronic kidney disease, or unspecified chronic kidney disease: Secondary | ICD-10-CM | POA: Diagnosis not present

## 2018-07-18 DIAGNOSIS — M79674 Pain in right toe(s): Secondary | ICD-10-CM | POA: Diagnosis not present

## 2018-07-18 DIAGNOSIS — M79675 Pain in left toe(s): Secondary | ICD-10-CM | POA: Diagnosis not present

## 2018-07-18 DIAGNOSIS — B351 Tinea unguium: Secondary | ICD-10-CM | POA: Diagnosis not present

## 2018-07-18 DIAGNOSIS — E11621 Type 2 diabetes mellitus with foot ulcer: Secondary | ICD-10-CM | POA: Diagnosis not present

## 2018-07-19 DIAGNOSIS — I130 Hypertensive heart and chronic kidney disease with heart failure and stage 1 through stage 4 chronic kidney disease, or unspecified chronic kidney disease: Secondary | ICD-10-CM | POA: Diagnosis not present

## 2018-07-19 DIAGNOSIS — E11621 Type 2 diabetes mellitus with foot ulcer: Secondary | ICD-10-CM | POA: Diagnosis not present

## 2018-07-19 DIAGNOSIS — E1122 Type 2 diabetes mellitus with diabetic chronic kidney disease: Secondary | ICD-10-CM | POA: Diagnosis not present

## 2018-07-19 DIAGNOSIS — L97512 Non-pressure chronic ulcer of other part of right foot with fat layer exposed: Secondary | ICD-10-CM | POA: Diagnosis not present

## 2018-07-19 DIAGNOSIS — N183 Chronic kidney disease, stage 3 (moderate): Secondary | ICD-10-CM | POA: Diagnosis not present

## 2018-07-19 DIAGNOSIS — I5032 Chronic diastolic (congestive) heart failure: Secondary | ICD-10-CM | POA: Diagnosis not present

## 2018-07-24 DIAGNOSIS — E1122 Type 2 diabetes mellitus with diabetic chronic kidney disease: Secondary | ICD-10-CM | POA: Diagnosis not present

## 2018-07-24 DIAGNOSIS — N183 Chronic kidney disease, stage 3 (moderate): Secondary | ICD-10-CM | POA: Diagnosis not present

## 2018-07-24 DIAGNOSIS — L97512 Non-pressure chronic ulcer of other part of right foot with fat layer exposed: Secondary | ICD-10-CM | POA: Diagnosis not present

## 2018-07-24 DIAGNOSIS — E11621 Type 2 diabetes mellitus with foot ulcer: Secondary | ICD-10-CM | POA: Diagnosis not present

## 2018-07-24 DIAGNOSIS — I130 Hypertensive heart and chronic kidney disease with heart failure and stage 1 through stage 4 chronic kidney disease, or unspecified chronic kidney disease: Secondary | ICD-10-CM | POA: Diagnosis not present

## 2018-07-24 DIAGNOSIS — I5032 Chronic diastolic (congestive) heart failure: Secondary | ICD-10-CM | POA: Diagnosis not present

## 2018-07-25 ENCOUNTER — Telehealth: Payer: Self-pay | Admitting: Cardiology

## 2018-07-25 DIAGNOSIS — I10 Essential (primary) hypertension: Secondary | ICD-10-CM

## 2018-07-25 DIAGNOSIS — N184 Chronic kidney disease, stage 4 (severe): Secondary | ICD-10-CM

## 2018-07-25 NOTE — Telephone Encounter (Signed)
Called Butch Penny @ [facility]. She was not in office. Sent torsemide to [facility]. Lab order placed.

## 2018-07-25 NOTE — Telephone Encounter (Signed)
Returned [employer] call. She states that she cannot have a standing PRN order for the pt's torsemide. It must be called in for a specific amount per day- she stated it would be helpful if the pt could take and extra pill several times weekly - such as  MON., WED., FRI. As the pt continues to gain weight. Please advise.

## 2018-07-25 NOTE — Telephone Encounter (Signed)
   She was taking Torsemide 40mg  daily at the time of her office visit. Would have her increase to 40mg  daily alternating with 60mg  daily. Will need a repeat BMET in 7-10 days to reassess kidney function and K+ levels. They need to make sure she has follow-up with Nephrology as well in the setting of Stage 4 CKD and worsening fluid status.   Signed, Erma Heritage, PA-C 07/25/2018, 11:13 AM Pager: [phone]

## 2018-07-25 NOTE — Telephone Encounter (Signed)
Per Butch Penny at [facility] pt's had a 5llb weight gain since Saturday.

## 2018-07-26 ENCOUNTER — Other Ambulatory Visit: Payer: Self-pay

## 2018-07-26 ENCOUNTER — Telehealth: Payer: Self-pay | Admitting: Cardiology

## 2018-07-26 MED ORDER — TORSEMIDE 20 MG PO TABS: ORAL_TABLET | ORAL | 3 refills | Status: DC

## 2018-07-26 NOTE — Telephone Encounter (Signed)
New message   Pt c/o medication issue:  1. Name of Medication: torsemide   2. How are you currently taking this medication (dosage and times per day)? n/a  3. Are you having a reaction (difficulty breathing--STAT)? n/a  4. What is your medication issue? Per Anderson Malta need to clarify the prescription dosage. Anderson Malta states that you can send a new prescription if needed.

## 2018-07-26 NOTE — Telephone Encounter (Signed)
Printed labs & Brittany's orders and sent back with nurse from [facility].

## 2018-07-26 NOTE — Telephone Encounter (Signed)
Script sent to [facility].

## 2018-07-27 ENCOUNTER — Emergency Department (HOSPITAL_COMMUNITY): Payer: Medicare Other

## 2018-07-27 ENCOUNTER — Telehealth (HOSPITAL_COMMUNITY): Payer: Self-pay | Admitting: *Deleted

## 2018-07-27 ENCOUNTER — Inpatient Hospital Stay (HOSPITAL_COMMUNITY)
Admission: EM | Admit: 2018-07-27 | Discharge: 2018-08-01 | DRG: 091 | Disposition: A | Discharge status: Nursing Home (Includes ICF) | Payer: Medicare Other | Source: Skilled Nursing Facility | Attending: Internal Medicine | Admitting: Internal Medicine

## 2018-07-27 ENCOUNTER — Encounter (HOSPITAL_COMMUNITY): Payer: Self-pay

## 2018-07-27 ENCOUNTER — Other Ambulatory Visit: Payer: Self-pay

## 2018-07-27 ENCOUNTER — Inpatient Hospital Stay (HOSPITAL_COMMUNITY): Payer: Medicare Other

## 2018-07-27 DIAGNOSIS — Z794 Long term (current) use of insulin: Secondary | ICD-10-CM | POA: Diagnosis not present

## 2018-07-27 DIAGNOSIS — G8929 Other chronic pain: Secondary | ICD-10-CM | POA: Diagnosis present

## 2018-07-27 DIAGNOSIS — I5032 Chronic diastolic (congestive) heart failure: Secondary | ICD-10-CM | POA: Diagnosis not present

## 2018-07-27 DIAGNOSIS — N184 Chronic kidney disease, stage 4 (severe): Secondary | ICD-10-CM | POA: Diagnosis not present

## 2018-07-27 DIAGNOSIS — M109 Gout, unspecified: Secondary | ICD-10-CM | POA: Diagnosis present

## 2018-07-27 DIAGNOSIS — R404 Transient alteration of awareness: Secondary | ICD-10-CM | POA: Diagnosis not present

## 2018-07-27 DIAGNOSIS — J449 Chronic obstructive pulmonary disease, unspecified: Secondary | ICD-10-CM | POA: Diagnosis not present

## 2018-07-27 DIAGNOSIS — E1142 Type 2 diabetes mellitus with diabetic polyneuropathy: Secondary | ICD-10-CM

## 2018-07-27 DIAGNOSIS — D631 Anemia in chronic kidney disease: Secondary | ICD-10-CM | POA: Diagnosis not present

## 2018-07-27 DIAGNOSIS — F329 Major depressive disorder, single episode, unspecified: Secondary | ICD-10-CM | POA: Diagnosis present

## 2018-07-27 DIAGNOSIS — E559 Vitamin D deficiency, unspecified: Secondary | ICD-10-CM | POA: Diagnosis present

## 2018-07-27 DIAGNOSIS — Z8673 Personal history of transient ischemic attack (TIA), and cerebral infarction without residual deficits: Secondary | ICD-10-CM

## 2018-07-27 DIAGNOSIS — I639 Cerebral infarction, unspecified: Secondary | ICD-10-CM | POA: Diagnosis not present

## 2018-07-27 DIAGNOSIS — K219 Gastro-esophageal reflux disease without esophagitis: Secondary | ICD-10-CM | POA: Diagnosis present

## 2018-07-27 DIAGNOSIS — I130 Hypertensive heart and chronic kidney disease with heart failure and stage 1 through stage 4 chronic kidney disease, or unspecified chronic kidney disease: Secondary | ICD-10-CM | POA: Diagnosis present

## 2018-07-27 DIAGNOSIS — G92 Toxic encephalopathy: Principal | ICD-10-CM | POA: Diagnosis present

## 2018-07-27 DIAGNOSIS — R4020 Unspecified coma: Secondary | ICD-10-CM | POA: Diagnosis not present

## 2018-07-27 DIAGNOSIS — Z880 Allergy status to penicillin: Secondary | ICD-10-CM

## 2018-07-27 DIAGNOSIS — J9601 Acute respiratory failure with hypoxia: Secondary | ICD-10-CM | POA: Diagnosis present

## 2018-07-27 DIAGNOSIS — J9811 Atelectasis: Secondary | ICD-10-CM | POA: Diagnosis not present

## 2018-07-27 DIAGNOSIS — K2270 Barrett's esophagus without dysplasia: Secondary | ICD-10-CM | POA: Diagnosis present

## 2018-07-27 DIAGNOSIS — G249 Dystonia, unspecified: Secondary | ICD-10-CM | POA: Diagnosis present

## 2018-07-27 DIAGNOSIS — Z4682 Encounter for fitting and adjustment of non-vascular catheter: Secondary | ICD-10-CM | POA: Diagnosis not present

## 2018-07-27 DIAGNOSIS — F203 Undifferentiated schizophrenia: Secondary | ICD-10-CM | POA: Diagnosis not present

## 2018-07-27 DIAGNOSIS — Z825 Family history of asthma and other chronic lower respiratory diseases: Secondary | ICD-10-CM

## 2018-07-27 DIAGNOSIS — G9389 Other specified disorders of brain: Secondary | ICD-10-CM | POA: Diagnosis present

## 2018-07-27 DIAGNOSIS — M2550 Pain in unspecified joint: Secondary | ICD-10-CM | POA: Diagnosis not present

## 2018-07-27 DIAGNOSIS — G8250 Quadriplegia, unspecified: Secondary | ICD-10-CM | POA: Diagnosis present

## 2018-07-27 DIAGNOSIS — G9340 Encephalopathy, unspecified: Secondary | ICD-10-CM

## 2018-07-27 DIAGNOSIS — Z6841 Body Mass Index (BMI) 40.0 and over, adult: Secondary | ICD-10-CM | POA: Diagnosis not present

## 2018-07-27 DIAGNOSIS — Z86711 Personal history of pulmonary embolism: Secondary | ICD-10-CM

## 2018-07-27 DIAGNOSIS — E785 Hyperlipidemia, unspecified: Secondary | ICD-10-CM | POA: Diagnosis not present

## 2018-07-27 DIAGNOSIS — I5033 Acute on chronic diastolic (congestive) heart failure: Secondary | ICD-10-CM | POA: Diagnosis present

## 2018-07-27 DIAGNOSIS — I10 Essential (primary) hypertension: Secondary | ICD-10-CM | POA: Diagnosis not present

## 2018-07-27 DIAGNOSIS — Z888 Allergy status to other drugs, medicaments and biological substances status: Secondary | ICD-10-CM

## 2018-07-27 DIAGNOSIS — Z79899 Other long term (current) drug therapy: Secondary | ICD-10-CM

## 2018-07-27 DIAGNOSIS — Z79890 Hormone replacement therapy (postmenopausal): Secondary | ICD-10-CM

## 2018-07-27 DIAGNOSIS — Z993 Dependence on wheelchair: Secondary | ICD-10-CM

## 2018-07-27 DIAGNOSIS — Z978 Presence of other specified devices: Secondary | ICD-10-CM | POA: Diagnosis not present

## 2018-07-27 DIAGNOSIS — E662 Morbid (severe) obesity with alveolar hypoventilation: Secondary | ICD-10-CM | POA: Diagnosis present

## 2018-07-27 DIAGNOSIS — E1122 Type 2 diabetes mellitus with diabetic chronic kidney disease: Secondary | ICD-10-CM | POA: Diagnosis present

## 2018-07-27 DIAGNOSIS — M069 Rheumatoid arthritis, unspecified: Secondary | ICD-10-CM | POA: Diagnosis present

## 2018-07-27 DIAGNOSIS — R29727 NIHSS score 27: Secondary | ICD-10-CM | POA: Diagnosis not present

## 2018-07-27 DIAGNOSIS — Z823 Family history of stroke: Secondary | ICD-10-CM

## 2018-07-27 DIAGNOSIS — R4701 Aphasia: Secondary | ICD-10-CM | POA: Diagnosis not present

## 2018-07-27 DIAGNOSIS — M797 Fibromyalgia: Secondary | ICD-10-CM | POA: Diagnosis not present

## 2018-07-27 DIAGNOSIS — E1165 Type 2 diabetes mellitus with hyperglycemia: Secondary | ICD-10-CM | POA: Diagnosis not present

## 2018-07-27 DIAGNOSIS — Z885 Allergy status to narcotic agent status: Secondary | ICD-10-CM

## 2018-07-27 DIAGNOSIS — R4182 Altered mental status, unspecified: Secondary | ICD-10-CM | POA: Diagnosis not present

## 2018-07-27 DIAGNOSIS — E039 Hypothyroidism, unspecified: Secondary | ICD-10-CM | POA: Diagnosis not present

## 2018-07-27 DIAGNOSIS — Z9049 Acquired absence of other specified parts of digestive tract: Secondary | ICD-10-CM

## 2018-07-27 DIAGNOSIS — T43595A Adverse effect of other antipsychotics and neuroleptics, initial encounter: Secondary | ICD-10-CM | POA: Diagnosis not present

## 2018-07-27 DIAGNOSIS — F209 Schizophrenia, unspecified: Secondary | ICD-10-CM | POA: Diagnosis not present

## 2018-07-27 DIAGNOSIS — Z7401 Bed confinement status: Secondary | ICD-10-CM | POA: Diagnosis not present

## 2018-07-27 DIAGNOSIS — Z7982 Long term (current) use of aspirin: Secondary | ICD-10-CM

## 2018-07-27 DIAGNOSIS — Z8249 Family history of ischemic heart disease and other diseases of the circulatory system: Secondary | ICD-10-CM

## 2018-07-27 DIAGNOSIS — Z882 Allergy status to sulfonamides status: Secondary | ICD-10-CM

## 2018-07-27 DIAGNOSIS — R400 Somnolence: Secondary | ICD-10-CM | POA: Diagnosis not present

## 2018-07-27 DIAGNOSIS — F4310 Post-traumatic stress disorder, unspecified: Secondary | ICD-10-CM | POA: Diagnosis present

## 2018-07-27 DIAGNOSIS — R410 Disorientation, unspecified: Secondary | ICD-10-CM | POA: Diagnosis not present

## 2018-07-27 DIAGNOSIS — Z7952 Long term (current) use of systemic steroids: Secondary | ICD-10-CM

## 2018-07-27 DIAGNOSIS — I959 Hypotension, unspecified: Secondary | ICD-10-CM | POA: Diagnosis not present

## 2018-07-27 DIAGNOSIS — Z87442 Personal history of urinary calculi: Secondary | ICD-10-CM

## 2018-07-27 DIAGNOSIS — J9602 Acute respiratory failure with hypercapnia: Secondary | ICD-10-CM | POA: Diagnosis not present

## 2018-07-27 DIAGNOSIS — F419 Anxiety disorder, unspecified: Secondary | ICD-10-CM | POA: Diagnosis present

## 2018-07-27 DIAGNOSIS — Z87891 Personal history of nicotine dependence: Secondary | ICD-10-CM

## 2018-07-27 DIAGNOSIS — R471 Dysarthria and anarthria: Secondary | ICD-10-CM | POA: Diagnosis present

## 2018-07-27 LAB — I-STAT CHEM 8, ED
BUN: 38 mg/dL — ABNORMAL HIGH (ref 8–23)
Calcium, Ion: 1.24 mmol/L (ref 1.15–1.40)
Chloride: 103 mmol/L (ref 98–111)
Creatinine, Ser: 2.4 mg/dL — ABNORMAL HIGH (ref 0.44–1.00)
Glucose, Bld: 232 mg/dL — ABNORMAL HIGH (ref 70–99)
HCT: 27 % — ABNORMAL LOW (ref 36.0–46.0)
Hemoglobin: 9.2 g/dL — ABNORMAL LOW (ref 12.0–15.0)
Potassium: 4.4 mmol/L (ref 3.5–5.1)
Sodium: 138 mmol/L (ref 135–145)
TCO2: 27 mmol/L (ref 22–32)

## 2018-07-27 LAB — CBC
HCT: 28.5 % — ABNORMAL LOW (ref 36.0–46.0)
Hemoglobin: 9.4 g/dL — ABNORMAL LOW (ref 12.0–15.0)
MCH: 32.2 pg (ref 26.0–34.0)
MCHC: 33 g/dL (ref 30.0–36.0)
MCV: 97.6 fL (ref 80.0–100.0)
Platelets: 203 10*3/uL (ref 150–400)
RBC: 2.92 MIL/uL — ABNORMAL LOW (ref 3.87–5.11)
RDW: 13.7 % (ref 11.5–15.5)
WBC: 8.4 10*3/uL (ref 4.0–10.5)
nRBC: 0 % (ref 0.0–0.2)

## 2018-07-27 LAB — BLOOD GAS, ARTERIAL
Acid-base deficit: 0.7 mmol/L (ref 0.0–2.0)
Bicarbonate: 23.8 mmol/L (ref 20.0–28.0)
Drawn by: 234301
FIO2: 50
MECHVT: 420 mL
O2 Saturation: 98.8 %
PEEP: 5 cmH2O
Patient temperature: 37
RATE: 18 resp/min
pCO2 arterial: 50.5 mmHg — ABNORMAL HIGH (ref 32.0–48.0)
pH, Arterial: 7.311 — ABNORMAL LOW (ref 7.350–7.450)
pO2, Arterial: 131 mmHg — ABNORMAL HIGH (ref 83.0–108.0)

## 2018-07-27 LAB — URINALYSIS, ROUTINE W REFLEX MICROSCOPIC
Bilirubin Urine: NEGATIVE
Glucose, UA: 50 mg/dL — AB
Hgb urine dipstick: NEGATIVE
Ketones, ur: NEGATIVE mg/dL
Leukocytes, UA: NEGATIVE
Nitrite: NEGATIVE
Protein, ur: 100 mg/dL — AB
Specific Gravity, Urine: 1.006 (ref 1.005–1.030)
pH: 6 (ref 5.0–8.0)

## 2018-07-27 LAB — APTT: aPTT: 28 seconds (ref 24–36)

## 2018-07-27 LAB — COMPREHENSIVE METABOLIC PANEL
ALT: 28 U/L (ref 0–44)
AST: 20 U/L (ref 15–41)
Albumin: 3.2 g/dL — ABNORMAL LOW (ref 3.5–5.0)
Alkaline Phosphatase: 89 U/L (ref 38–126)
Anion gap: 8 (ref 5–15)
BUN: 43 mg/dL — ABNORMAL HIGH (ref 8–23)
CO2: 25 mmol/L (ref 22–32)
Calcium: 9 mg/dL (ref 8.9–10.3)
Chloride: 105 mmol/L (ref 98–111)
Creatinine, Ser: 2.33 mg/dL — ABNORMAL HIGH (ref 0.44–1.00)
GFR calc Af Amer: 24 mL/min — ABNORMAL LOW (ref >60)
GFR calc non Af Amer: 21 mL/min — ABNORMAL LOW (ref >60)
Glucose, Bld: 233 mg/dL — ABNORMAL HIGH (ref 70–99)
Potassium: 4.4 mmol/L (ref 3.5–5.1)
Sodium: 138 mmol/L (ref 135–145)
Total Bilirubin: 0.3 mg/dL (ref 0.3–1.2)
Total Protein: 6.8 g/dL (ref 6.5–8.1)

## 2018-07-27 LAB — RAPID URINE DRUG SCREEN, HOSP PERFORMED
Amphetamines: NOT DETECTED
Barbiturates: NOT DETECTED
Benzodiazepines: NOT DETECTED
Cocaine: NOT DETECTED
Opiates: NOT DETECTED
Tetrahydrocannabinol: NOT DETECTED

## 2018-07-27 LAB — DIFFERENTIAL
Abs Immature Granulocytes: 0.04 10*3/uL (ref 0.00–0.07)
Basophils Absolute: 0 10*3/uL (ref 0.0–0.1)
Basophils Relative: 0 %
Eosinophils Absolute: 0.3 10*3/uL (ref 0.0–0.5)
Eosinophils Relative: 4 %
Immature Granulocytes: 1 %
Lymphocytes Relative: 11 %
Lymphs Abs: 0.9 10*3/uL (ref 0.7–4.0)
Monocytes Absolute: 0.7 10*3/uL (ref 0.1–1.0)
Monocytes Relative: 8 %
Neutro Abs: 6.4 10*3/uL (ref 1.7–7.7)
Neutrophils Relative %: 76 %

## 2018-07-27 LAB — GLUCOSE, CAPILLARY: Glucose-Capillary: 172 mg/dL — ABNORMAL HIGH (ref 70–99)

## 2018-07-27 LAB — ETHANOL: Alcohol, Ethyl (B): <10 mg/dL (ref <10)

## 2018-07-27 LAB — I-STAT TROPONIN, ED: Troponin i, poc: 0 ng/mL (ref 0.00–0.08)

## 2018-07-27 LAB — CBG MONITORING, ED
Glucose-Capillary: 260 mg/dL — ABNORMAL HIGH (ref 70–99)
Glucose-Capillary: 208 mg/dL — ABNORMAL HIGH (ref 70–99)

## 2018-07-27 LAB — PROTIME-INR
INR: 0.91
Prothrombin Time: 12.2 seconds (ref 11.4–15.2)

## 2018-07-27 MED ORDER — ASPIRIN 81 MG PO CHEW
81.0000 mg | CHEWABLE_TABLET | Freq: Every day | ORAL | Status: DC
  Administered 2018-07-28: 81 mg
  Filled 2018-07-27: qty 1

## 2018-07-27 MED ORDER — PROPOFOL 10 MG/ML IV BOLUS
60.0000 mg | Freq: Once | INTRAVENOUS | Status: AC
  Administered 2018-07-27: 60 mg via INTRAVENOUS
  Filled 2018-07-27: qty 20

## 2018-07-27 MED ORDER — ALBUTEROL SULFATE (2.5 MG/3ML) 0.083% IN NEBU: 2.5000 mg | INHALATION_SOLUTION | RESPIRATORY_TRACT | Status: DC | PRN

## 2018-07-27 MED ORDER — ROCURONIUM BROMIDE 50 MG/5ML IV SOLN
1.0000 mg/kg | Freq: Once | INTRAVENOUS | Status: AC
  Administered 2018-07-27: 102 mg via INTRAVENOUS

## 2018-07-27 MED ORDER — PROPOFOL 1000 MG/100ML IV EMUL
5.0000 ug/kg/min | INTRAVENOUS | Status: DC
  Administered 2018-07-27: 5 ug/kg/min via INTRAVENOUS
  Filled 2018-07-27: qty 100

## 2018-07-27 MED ORDER — FAMOTIDINE 20 MG PO TABS
20.0000 mg | ORAL_TABLET | Freq: Every day | ORAL | Status: DC
  Administered 2018-07-28 – 2018-08-01 (×5): 20 mg via ORAL
  Filled 2018-07-27 (×5): qty 1

## 2018-07-27 MED ORDER — NALOXONE HCL 0.4 MG/ML IJ SOLN
0.4000 mg | Freq: Once | INTRAMUSCULAR | Status: AC
  Administered 2018-07-27: 0.4 mg via INTRAVENOUS

## 2018-07-27 MED ORDER — MONTELUKAST SODIUM 10 MG PO TABS
10.0000 mg | ORAL_TABLET | Freq: Every day | ORAL | Status: DC
  Administered 2018-07-28 (×2): 10 mg
  Filled 2018-07-27 (×2): qty 1

## 2018-07-27 MED ORDER — ATORVASTATIN CALCIUM 40 MG PO TABS
40.0000 mg | ORAL_TABLET | Freq: Every day | ORAL | Status: DC
  Administered 2018-07-29 – 2018-07-31 (×3): 40 mg
  Filled 2018-07-27 (×4): qty 1
  Filled 2018-07-27: qty 4

## 2018-07-27 MED ORDER — SODIUM CHLORIDE 0.9 % IV SOLN
2000.0000 mg | Freq: Once | INTRAVENOUS | Status: AC
  Administered 2018-07-28: 2000 mg via INTRAVENOUS
  Filled 2018-07-27: qty 20

## 2018-07-27 MED ORDER — INSULIN ASPART 100 UNIT/ML ~~LOC~~ SOLN
0.0000 [IU] | SUBCUTANEOUS | Status: DC
  Administered 2018-07-27: 5 [IU] via SUBCUTANEOUS
  Administered 2018-07-27: 3 [IU] via SUBCUTANEOUS

## 2018-07-27 MED ORDER — BISACODYL 10 MG RE SUPP: 10.0000 mg | Freq: Every day | RECTAL | Status: DC | PRN

## 2018-07-27 MED ORDER — CHLORHEXIDINE GLUCONATE 0.12% ORAL RINSE (MEDLINE KIT)
15.0000 mL | Freq: Two times a day (BID) | OROMUCOSAL | Status: DC
  Administered 2018-07-27 – 2018-07-28 (×2): 15 mL via OROMUCOSAL

## 2018-07-27 MED ORDER — HEPARIN SODIUM (PORCINE) 5000 UNIT/ML IJ SOLN
5000.0000 [IU] | Freq: Three times a day (TID) | INTRAMUSCULAR | Status: DC
  Administered 2018-07-28 – 2018-08-01 (×13): 5000 [IU] via SUBCUTANEOUS
  Filled 2018-07-27 (×13): qty 1

## 2018-07-27 MED ORDER — ORAL CARE MOUTH RINSE
15.0000 mL | OROMUCOSAL | Status: DC
  Administered 2018-07-28 (×5): 15 mL via OROMUCOSAL

## 2018-07-27 MED ORDER — LEVOTHYROXINE SODIUM 25 MCG PO TABS
25.0000 ug | ORAL_TABLET | Freq: Every day | ORAL | Status: DC
  Administered 2018-07-28: 25 ug
  Filled 2018-07-27 (×2): qty 1

## 2018-07-27 MED ORDER — NALOXONE HCL 0.4 MG/ML IJ SOLN
INTRAMUSCULAR | Status: AC
  Administered 2018-07-27: 0.4 mg via INTRAVENOUS
  Filled 2018-07-27: qty 1

## 2018-07-27 MED ORDER — BUDESONIDE 0.5 MG/2ML IN SUSP
0.5000 mg | Freq: Two times a day (BID) | RESPIRATORY_TRACT | Status: DC
  Administered 2018-07-27 – 2018-08-01 (×10): 0.5 mg via RESPIRATORY_TRACT
  Filled 2018-07-27 (×11): qty 2

## 2018-07-27 MED ORDER — DOCUSATE SODIUM 50 MG/5ML PO LIQD: 100.0000 mg | Freq: Two times a day (BID) | ORAL | Status: DC | PRN

## 2018-07-27 MED ORDER — ARFORMOTEROL TARTRATE 15 MCG/2ML IN NEBU
15.0000 ug | INHALATION_SOLUTION | Freq: Two times a day (BID) | RESPIRATORY_TRACT | Status: DC
  Administered 2018-07-27 – 2018-08-01 (×10): 15 ug via RESPIRATORY_TRACT
  Filled 2018-07-27 (×11): qty 2

## 2018-07-27 MED ORDER — PROPOFOL 1000 MG/100ML IV EMUL
INTRAVENOUS | Status: AC
  Filled 2018-07-27: qty 100

## 2018-07-27 NOTE — ED Triage Notes (Signed)
Per [facility] Staff, Vira Blanco PCA, pt was last seen normal at 8am.  Reports pt is usually alert, oriented, and talkative.  Reports pt is nonambulatory.

## 2018-07-27 NOTE — ED Notes (Signed)
Atempted to call report 2nd time.

## 2018-07-27 NOTE — ED Provider Notes (Signed)
Emergency Department Provider Note   I have reviewed the triage vital signs and the nursing notes.   HISTORY  Chief Complaint Code Stroke   HPI [name] is a 67 y.o. female with PMH COPD, CKD, CHF, HTN, HLD, Schizophrenia sent to the emergency department for evaluation and of acute onset somnolence.  Patient is currently staying at [facility]. Initially told by EMS that last seen normal at 12:30 PM but called the assisted living and patient's LSN was 8 AM and she was discovered at 12:30 PM. At baseline she is talkative and interactive. Staff noted progressive drowsiness and called EMS. Blood sugar 279 in route with elevated blood pressure.   Level 5 caveat: Patient non-verbal at this time.    Past Medical History:  Diagnosis Date  . Anxiety   . Asthma   . Back pain   . Barrett esophagus   . CHF (congestive heart failure) ([place])   . CKD (chronic kidney disease) stage 3, GFR 30-59 ml/min (HCC)   . COPD (chronic obstructive pulmonary disease) ([place])   . Depression   . Diastolic heart failure ([place])   . Essential hypertension   . Fibromyalgia   . GERD (gastroesophageal reflux disease)   . Gout   . History of pericarditis   . Hyperlipidemia   . Hypothyroid   . Major depressive disorder   . Memory loss   . Nephrolithiasis    2008  . Obesity hypoventilation syndrome (HCC)    CPAP  . Obstructive sleep apnea   . PE (pulmonary embolism)    2010  . Peripheral neuropathy   . PTSD (post-traumatic stress disorder)   . Rheumatoid arthritis ([place])   . Schizophrenia ([place])   . Steroid dependence   . Type 2 diabetes mellitus ([place])   . Vitamin D deficiency     Patient Active Problem List   Diagnosis Date Noted  . Pleuritic chest pain 07/14/2018  . Moderate persistent asthma without complication   . Type 2 diabetes mellitus ([place]) 04/19/2018  . Acute kidney injury superimposed on chronic kidney disease ([place]) 01/13/2018  . Altered mental status   .  Encephalopathy 10/11/2016  . Chest pain 08/12/2016  . CKD (chronic kidney disease), stage IV ([place]) 08/12/2016  . History of pulmonary embolus (PE) 08/12/2016  . Obesity, Class III, BMI 40-49.9 (morbid obesity) ([place]) 08/12/2016  . RBBB 08/12/2016  . Positive D dimer 08/12/2016  . CVA (cerebral infarction) 11/10/2015  . Physical deconditioning 07/01/2011  . Weakness 07/01/2011  . Diabetic foot ulcer ([place]) 05/19/2011  . Polypharmacy 02/09/2011  . BACK PAIN WITH RADICULOPATHY 05/25/2010  . Type 2 diabetes mellitus with diabetic polyneuropathy, without long-term current use of insulin ([place]) 08/18/2009  . Chronic diastolic heart failure ([place]) 04/25/2009  . Normocytic anemia 04/14/2009  . Sleep apnea 12/11/2008  . Diabetic peripheral neuropathy ([place]) 05/04/2007  . HYPERCHOLESTEROLEMIA 10/13/2006  . Gout, unspecified 10/13/2006  . HYPOKALEMIA 10/13/2006  . Schizophrenia ([place]) 10/13/2006  . Depression 10/13/2006  . Essential hypertension 10/13/2006  . Venous (peripheral) insufficiency 10/13/2006  . RHINITIS, ALLERGIC 10/13/2006  . Asthma 10/13/2006  . Reflux esophagitis 10/13/2006  . OSTEOARTHRITIS, MULTI SITES 10/13/2006  . INCONTINENCE, URGE 10/13/2006    Past Surgical History:  Procedure Laterality Date  . APPENDECTOMY    . CHOLECYSTECTOMY    . COLONOSCOPY     benign polyps (12/2000), repeat colonoscopy performed in 2007  . ENDOMETRIAL BIOPSY     10/03 benign columnar mucosa (limited material)  .  KIDNEY SURGERY      Allergies Benztropine mesylate; Codeine; Diazepam; Diphenhydramine hcl; Penicillins; Sulfonamide derivatives; Thioridazine hcl; and Lisinopril  Family History  Problem Relation Age of Onset  . Bone cancer Mother   . Hypertension Mother   . Heart disease Mother   . COPD Father   . Heart disease Father   . Stroke Father     Social History Social History   Tobacco Use  . Smoking status: Former Smoker    Types: Cigarettes    Last attempt to quit:  06/23/1972    Years since quitting: 46.1  . Smokeless tobacco: Never Used  Substance Use Topics  . Alcohol use: No  . Drug use: No    Review of Systems  Level 5 caveat: AMS  ____________________________________________   PHYSICAL EXAM:  VITAL SIGNS: Vitals:   07/27/18 1620 07/27/18 1630  BP: (!) 176/82 (!) 191/85  Pulse: 90 89  Resp:  18  SpO2: 100% 100%    Constitutional: Somnolent. Grimacing to pain. Purposeful movement of the RUE initially but then becoming completely flaccid as ED visit progresses.  Eyes: Conjunctivae are normal. PERRL.  Head: Atraumatic. Nose: No congestion/rhinnorhea. Mouth/Throat: Mucous membranes are moist.  Oropharynx non-erythematous. Neck: No stridor.   Cardiovascular: Normal rate, regular rhythm. Good peripheral circulation. Grossly normal heart sounds.   Respiratory: Normal respiratory effort.  No retractions. Lungs CTAB. Gastrointestinal: Soft and nontender. No distention.  Musculoskeletal: No gross deformities of extremities. Neurologic: GCS 6. ([id number]). NIHSS 27. Aphasic. Initially purposeful to pain with the RUE but soon after is completely flaccid. Eyes open to spontaneously.  Skin:  Skin is warm, dry and intact. No rash noted.  ____________________________________________   LABS (all labs ordered are listed, but only abnormal results are displayed)  Labs Reviewed  CBC - Abnormal; Notable for the following components:      Result Value   RBC 2.92 (*)    Hemoglobin 9.4 (*)    HCT 28.5 (*)    All other components within normal limits  COMPREHENSIVE METABOLIC PANEL - Abnormal; Notable for the following components:   Glucose, Bld 233 (*)    BUN 43 (*)    Creatinine, Ser 2.33 (*)    Albumin 3.2 (*)    GFR calc non Af Amer 21 (*)    GFR calc Af Amer 24 (*)    All other components within normal limits  URINALYSIS, ROUTINE W REFLEX MICROSCOPIC - Abnormal; Notable for the following components:   Color, Urine STRAW (*)    Glucose,  UA 50 (*)    Protein, ur 100 (*)    Bacteria, UA RARE (*)    All other components within normal limits  BLOOD GAS, ARTERIAL - Abnormal; Notable for the following components:   pH, Arterial 7.311 (*)    pCO2 arterial 50.5 (*)    pO2, Arterial 131 (*)    All other components within normal limits  I-STAT CHEM 8, ED - Abnormal; Notable for the following components:   BUN 38 (*)    Creatinine, Ser 2.40 (*)    Glucose, Bld 232 (*)    Hemoglobin 9.2 (*)    HCT 27.0 (*)    All other components within normal limits  CBG MONITORING, ED - Abnormal; Notable for the following components:   Glucose-Capillary 260 (*)    All other components within normal limits  PROTIME-INR  APTT  DIFFERENTIAL  ETHANOL  RAPID URINE DRUG SCREEN, HOSP PERFORMED  I-STAT TROPONIN, ED   ____________________________________________  EKG   EKG Interpretation  Date/Time:  Thursday July 27 2018 15:06:50 EST Ventricular Rate:  82 PR Interval:    QRS Duration: 171 QT Interval:  428 QTC Calculation: 500 R Axis:   -50 Text Interpretation:  Sinus rhythm RBBB and LAFB Left ventricular hypertrophy No STEMI.  Confirmed by Nanda Quinton [phone]) on 07/27/2018 3:44:52 PM       ____________________________________________  RADIOLOGY  Dg Chest Portable 1 View  Result Date: 07/27/2018 CLINICAL DATA:  Tube placement. EXAM: PORTABLE CHEST 1 VIEW COMPARISON:  07/14/2018. FINDINGS: Endotracheal tube tip noted approximately 7 mm above the lower portion of the carina. Proximal repositioning of approximately 2 cm should be considered. NG tube noted with tip below left hemidiaphragm. Cardiomegaly with pulmonary venous congestion bilateral interstitial prominence. Findings consistent with CHF. No pneumothorax. IMPRESSION: 1. Endotracheal tube noted with its tip approximately 7 mm above the lower portion of the carina. Proximal repositioning of approximately 2 cm should be considered. NG tube noted with tip below left  hemidiaphragm. 2. Cardiomegaly with pulmonary venous congestion bilateral interstitial prominence consistent with CHF. Electronically Signed   By: Marcello Moores  Register   On: 07/27/2018 16:23   Ct Head Code Stroke Wo Contrast  Result Date: 07/27/2018 CLINICAL DATA:  Code stroke.  Altered mental status EXAM: CT HEAD WITHOUT CONTRAST TECHNIQUE: Contiguous axial images were obtained from the base of the skull through the vertex without intravenous contrast. COMPARISON:  Head CT 04/18/2018 FINDINGS: Brain: There is no mass, hemorrhage or extra-axial collection. The size and configuration of the ventricles and extra-axial CSF spaces are normal. There is a prominent perivascular space of the left lentiform nucleus, unchanged. Mild white matter hypoattenuation compatible with chronic small vessel disease. Vascular: No abnormal hyperdensity of the major intracranial arteries or dural venous sinuses. No intracranial atherosclerosis. Skull: The visualized skull base, calvarium and extracranial soft tissues are normal. Sinuses/Orbits: No fluid levels or advanced mucosal thickening of the visualized paranasal sinuses. No mastoid or middle ear effusion. The orbits are normal. ASPECTS [facility] Stroke Program Early CT Score) - Ganglionic level infarction (caudate, lentiform nuclei, internal capsule, insula, M1-M3 cortex): 7 - Supraganglionic infarction (M4-M6 cortex): 3 Total score (0-10 with 10 being normal): 10 IMPRESSION: 1. No hemorrhage or other acute abnormality. 2. ASPECTS is 10. These results were called by telephone at the time of interpretation on 07/27/2018 at 3:07 pm to Dr. Nanda Quinton , who verbally acknowledged these results. Electronically Signed   By: Ulyses Jarred M.D.   On: 07/27/2018 15:08    ____________________________________________   PROCEDURES  Procedure(s) performed:   Procedure Name: Intubation Date/Time: 07/27/2018 3:56 PM Performed by: Margette Fast, MD Pre-anesthesia Checklist: Patient  identified, Patient being monitored, Emergency Drugs available, Timeout performed and Suction available Oxygen Delivery Method: Non-rebreather mask Preoxygenation: Pre-oxygenation with 100% oxygen Induction Type: Rapid sequence Ventilation: Mask ventilation without difficulty Laryngoscope Size: Glidescope and 4 Grade View: Grade II Tube size: 7.5 mm Number of attempts: 1 Placement Confirmation: ETT inserted through vocal cords under direct vision,  CO2 detector and Breath sounds checked- equal and bilateral Secured at: 21 cm Tube secured with: ETT holder Dental Injury: Teeth and Oropharynx as per pre-operative assessment  Difficulty Due To: Difficulty was anticipated, Difficult Airway- due to large tongue, Difficult Airway- due to anterior larynx and Difficult Airway- due to limited oral opening    .Critical Care Performed by: Margette Fast, MD Authorized by: Margette Fast, MD   Critical care provider statement:    Critical  care time (minutes):  45   Critical care time was exclusive of:  Separately billable procedures and treating other patients and teaching time   Critical care was necessary to treat or prevent imminent or life-threatening deterioration of the following conditions:  CNS failure or compromise and respiratory failure   Critical care was time spent personally by me on the following activities:  Discussions with consultants, evaluation of patient's response to treatment, examination of patient, ordering and performing treatments and interventions, ordering and review of laboratory studies, ordering and review of radiographic studies, pulse oximetry, re-evaluation of patient's condition, obtaining history from patient or surrogate, review of old charts and ventilator management   I assumed direction of critical care for this patient from another provider in my specialty: no       ____________________________________________   INITIAL IMPRESSION / [place] /  ED COURSE  Pertinent labs & imaging results that were available during my care of the patient were reviewed by me and considered in my medical decision making (see chart for details).  Patient arrives to the emergency department as a code stroke.  She had acute onset mental status change at 12:30 PM.  On my exam the patient will grimace and grab my hand with her right arm when doing a sternal rub but is not moving the left upper extremity.  She does appear to have a right gaze preference.  Pupils are equal.  At this time she is maintaining her airway.  I have activated a code stroke and will send patient for emergent CT of the head.  03:50 PM Spoke with Dr. Lucia Gaskins after evaluation. NIHSS 27. No tPA given new LSN time of 8 AM now 7 hours out. Dr. Lucia Gaskins suspecting brainstem CVA. Do not think that patient would benefit from CTA at this time. Given mental status and GCS 6 with worsening mental status even since arrival, the patient was intubated for airway protection. Labs and CT imaging reviewed. Spoke with Radiology regarding the negative CT head. Attempted to reach emergency contact in the Demographics section. No answer upon calling.    Spoke with Dr. Cheral Marker via Karen Chafe. Recommends ED-ED transfer for CTA and perfusion to consider IR intervention. Spoke with Dr. Ayesha Rumpf who accepts the patient to the ED.   ____________________________________________  FINAL CLINICAL IMPRESSION(S) / ED DIAGNOSES  Final diagnoses:  Acute ischemic stroke ([place])    MEDICATIONS GIVEN DURING THIS VISIT:  Medications  propofol (DIPRIVAN) 1000 MG/100ML infusion (5 mcg/kg/min  102 kg Intravenous New Bag/Given 07/27/18 1556)  naloxone (NARCAN) injection 0.4 mg (0.4 mg Intravenous Given by Other 07/27/18 1615)  rocuronium (ZEMURON) injection 102 mg (102 mg Intravenous Given 07/27/18 1533)  propofol (DIPRIVAN) 10 mg/mL bolus/IV push 60 mg (60 mg Intravenous Given 07/27/18 1533)    Note:  This document was prepared  using Dragon voice recognition software and may include unintentional dictation errors.  Nanda Quinton, MD Emergency Medicine    Long, W[facility], MD 07/27/18 [phone]

## 2018-07-27 NOTE — Progress Notes (Signed)
Stat EEG completed, results pending.  Dr. Leonel Ramsay aware.

## 2018-07-27 NOTE — ED Notes (Signed)
Dr. Inez Catalina at bedside

## 2018-07-27 NOTE — Sedation Documentation (Signed)
Resident attempting intubation with Dr. Laverta Baltimore assisting.

## 2018-07-27 NOTE — ED Notes (Signed)
No response from narcan.  Pt moved to room 2 and preparing to intubate.

## 2018-07-27 NOTE — ED Notes (Signed)
Pt in MRI with this RN and respiratory. Pt on MRI monitor and pump. Dr. Inez Catalina in MRI as well

## 2018-07-27 NOTE — ED Provider Notes (Signed)
Patient arrived from any pain hospital.  Patient was initially seen there and intubated for acute encephalopathy.  Concern initially for possible stroke.  MRI here shows no large stroke per initial read by neurology.  Will admit to the ICU for acute encephalopathy and respiratory failure due to loss of airway protection.   Duffy Bruce, MD 07/27/18 [phone]

## 2018-07-27 NOTE — ED Notes (Signed)
Pt arrived via [facility]. Pt remains on 41mcg of propofol. Second IV started en route. ET tube, OG, and foley in place on arrival.

## 2018-07-27 NOTE — ED Notes (Signed)
Attempted to call report

## 2018-07-27 NOTE — Sedation Documentation (Addendum)
ED Provider at bedside. 

## 2018-07-27 NOTE — Consult Note (Signed)
[facility] TeleNeurology Consult Services   Date of Service:   07/27/2018 14:54:46  Impression:     .  Posterior Circulation Infarct     .  Possible brainstem stroke  Comments: Differential Diagnosis: 1. Cardioembolic stroke 2. Small vessel disease/lacune 3. Thromboembolic, artery-to-artery mechanism 4. Hypercoagulable state-related infarct 5. Transient ischemic attack 6. Thrombotic mechanism, large artery disease Note: pupils are pinpoint and there was no response to Narcan  Mechanism of Stroke: Possible Thromboembolic Possible Cardioembolic  Metrics: Last Known Well: 07/27/2018 08:00:00 [facility] Notification Time: 07/27/2018 14:54:06 Arrival Time: 07/27/2018 14:54:00 Stamp Time: 07/27/2018 14:54:46 Time First Login Attempt: 07/27/2018 14:59:20 Video Start Time: 07/27/2018 14:59:20  Symptoms: Unresponsive NIHSS Start Assessment Time: 07/27/2018 15:04:00 Patient is not a candidate for tPA. Patient was not deemed candidate for tPA thrombolytics because of Last Well Known Above 4.5 Hours. Video End Time: 07/27/2018 15:10:47  CT head showed no acute hemorrhage or acute core infarct. CT head was reviewed and results were: Old lacunar infarction left putamen. All area of either mall development or cortical encephalomalacia superior left temporal lobe by the insula.  Advanced imaging was not obtained as the presentation was not suggestive of Large Vessel Occlusive Disease.   ER Physician notified of the decision on thrombolytics management on 07/27/2018 15:10:00  Our recommendations are outlined below.  Recommendations:     .  Activate Stroke Protocol Admission/Order Set     .  Stroke/Telemetry Floor     .  Neuro Checks     .  Bedside Swallow Eval     .  DVT Prophylaxis     .  IV Fluids, Normal Saline     .  Head of Bed Below 30 Degrees     .  Euglycemia and Avoid Hyperthermia (PRN Acetaminophen)     .  Hold Antithrombotics for Now       ------------------------------------------------------------------------------  History of Present Illness: Patient is a 67 year old Female.  Patient was brought by EMS for symptoms of Unresponsive  67 year old woman with history of schizophrenia living in a facility. She is wheelchair-bound but normally alert and oriented and conversing. She was last seen normal at 0800 this morning by staff at approximately noon was found less responsive unable to communicate. When she arrived the hospital she was moving a right arm but an eyesore she was moving nothing. She has a history of hypertension, diabetes, congestive heart failure, pericarditis but no evidence of atrial fibrillation. She is not on anticoagulation noting a history of pulmonary emboli.  CT head showed no acute hemorrhage or acute core infarct. CT head was reviewed.  Last seen normal was beyond 4.5 hours of presentation. There is no history of hemorrhagic complications or intracranial hemorrhage. There is no history of Recent Anticoagulants. There is no history of recent major surgery. There is no history of recent stroke.  Examination: BP(101/55), Pulse(78), Blood Glucose(279) 1A: Level of Consciousness - Arouses to minor stimulation + 1 1B: Ask Month and Age - Aphasic + 2 1C: Blink Eyes & Squeeze Hands - Performs 0 Tasks + 2 2: Test Horizontal Extraocular Movements - Normal + 0 3: Test Visual Fields - No Visual Loss + 0 4: Test Facial Palsy (Use Grimace if Obtunded) - Normal symmetry + 0 5A: Test Left Arm Motor Drift - No Movement + 4 5B: Test Right Arm Motor Drift - No Movement + 4 6A: Test Left Leg Motor Drift - No Movement + 4 6B: Test Right Leg Motor Drift -  No Movement + 4 7: Test Limb Ataxia (FNF/Heel-Shin) - No Ataxia + 0 8: Test Sensation - Complete Loss: Cannot Sense Being Touched At All + 2 9: Test Language/Aphasia - Mute/Global Aphasia: No Usable Speech/Auditory Comprehension + 3 10: Test Dysarthria -  Mild-Moderate Dysarthria: Slurring but can be understood + 1 11: Test Extinction/Inattention - No abnormality + 0  NIHSS Score: 27  Patient was informed the Neurology Consult would happen via TeleHealth consult by way of interactive audio and video telecommunications and consented to receiving care in this manner.  Due to the immediate potential for life-threatening deterioration due to underlying acute neurologic illness, I spent 35 minutes providing critical care. This time includes time for face to face visit via telemedicine, review of medical records, imaging studies and discussion of findings with providers, the patient and/or family.   Dr Cindie Laroche   [facility] [phone]  Case [id number]

## 2018-07-27 NOTE — ED Notes (Signed)
Report given to Bobby, RN.  

## 2018-07-27 NOTE — ED Notes (Signed)
EEG technician at bedside

## 2018-07-27 NOTE — Sedation Documentation (Signed)
Dr. Laverta [place] intubated with size 7.5ett

## 2018-07-27 NOTE — H&P (Signed)
NAMEBettie Capistran, MRN:  [id number], DOB:  1950-12-21, LOS: 0 ADMISSION DATE:  07/27/2018, CONSULTATION DATE:  07/27/2018 REFERRING MD:  Dr. Cheral Marker, CHIEF COMPLAINT:  AMS  Brief History   67 yoF presenting with acute encephalopathy and weakness.  Intubated for airway protection.  Initial stroke workup negative.  Unclear etiology for encephalopathy at this time.  PCCM to admit.  History of present illness   HPI obtained from is encephalopathic, intubated, and mechanically ventilated.  67 year old female with history significant for schizophrenia, depression, diastolic heart failure, HTN, HLD, DMT2, CKD stage IV, asthma, chronic anemia, chronic pain, GERD, hypothyroid, OSA on CPAP who is a resident at [place] [facility] who presented to [facility] ED for altered mental status.  At baseline, patient is wheelchair bound, verbal, and conversant.  She is dependent on her ADL but able to feed herself.  LSW at 0800 on 12/12 and noted at lunch to be very drowsy and drifting off sleep.  CBG 279.  On arrival to ER, patient initially with GCS of 6 ([id number]), aphasic with purposeful movement of RUE but then progressed to completely flaccid.  Intubated for airway protection. There was concern for acute stroke and code stroke activated. NIHSS 27.  CT head negative.  Not a candidate for TPA based on time criteria.  Decision was made to transfer patient to [facility] for MRI/ MRA for concern of brainstem stroke and possible IR.  Labs noted for negative UA, neg troponin, non acute EKG, neg LFTs, sCr 2.33 (which is around baseline), WBC 8.4, Hgb 9.4 (baseline 9), UDS negative, ETOH neg.  CXR with vascular congestion.  She has been afebrile and normotensive.  MRI/ MRA at [facility] with no acute findings. Endovascular treatment ruled out by Neuro/ Neuro IR based on additional history based on guidelines and MRA that did not show LVO.  No apparent infectious etiology.  Neurology following.  Some concern for seizure vs  postictal state vs brainstem stroke although MRI negative.  PCCM to admit.   Past Medical History  Former smoker, schizophrenia, depression, anxiety, PTSD, OHS/ OSA on CPAP, diastolic heart failure, HTN, HLD, DMT2, CKD stage IV, asthma, chronic anemia, chronic pain, GERD, hypothyroid, PE (2010), RA, gout, fibromyalgia  Significant Hospital Events   12/12 Admitted  Consults:  Neurology  Procedures:  12/12 ETT >>  Significant Diagnostic Tests:  12/12 CTH >> negative; ASPECTS 10  12/12 MRI/MRA >> 1. No acute intracranial abnormality. 2. Findings of chronic microvascular ischemia. 3. Normal intracranial MRA.  Micro Data:   Antimicrobials:   Interim history/subjective:  Propofol at 5 mcg/kg/min  Objective   Blood pressure (!) 148/75, pulse 87, resp. rate 20, height 5\' 3"  (1.6 m), weight 102 kg, SpO2 99 %.    Vent Mode: PRVC FiO2 (%):  [40 %-50 %] 40 % Set Rate:  [18 bmp-20 bmp] 20 bmp Vt Set:  [420 mL] 420 mL PEEP:  [5 cmH20] 5 cmH20 Pressure Support:  [5 cmH20] 5 cmH20 Plateau Pressure:  [18 cmH20-19 cmH20] 19 cmH20   Intake/Output Summary (Last 24 hours) at 07/27/2018 2051 Last data filed at 07/27/2018 2039 Gross per 24 hour  Intake 0 ml  Output 1850 ml  Net -1850 ml   Filed Weights   07/27/18 1507  Weight: 102 kg   Examination: General:  67 obese female on MV in NAD HEENT: MM pink/moist, ETT 7.5 at 22 cm, OGT, pupils 3/reactive, anicteric, no rigidity of neck noted Neuro: opens eyes to verbal and tracks- blinks  twice to commands, and very slight movement in both hands on commands, otherwise flaccid CV: RR, no murmur, +2 pulses PULM: even/non-labored on full MV support, completely apneic when assessed on PSV, lungs bilaterally clear, diminished in bases IR:JJOA, ND, +BS  Extremities: warm/dry, +1 LE edema, some chronic venous statis changes Skin: no rashes   Resolved Hospital Problem list    Assessment & Plan:  Acute encephalopathy  - unclear  etiology at this point - ddx broad- concern for brainstem stroke still possibility per Neuro even though MRA negative vs ?seizure/ postictal states vs NCSE  - no apparent infectious etiology, UDS neg, TSH was normal as of 04/2018, no significant electrolyte abnormalities - no apparent changes in pysch meds, appreciate pharmacy review.  Patient taking risperidone.  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  If BP remains stable- restart 12/13  DMT2 P:  CBG q 4 SSI   Hypothyroidism P:  Continue levothyroxine daily   Hx anxiety/ depression/ schizophrenia/ PTSD P:  Hold home ativan, trazadone (for sleep), prazosin (for nightmares), hydroxyzine, neurontin, cymbalta and risperidone (as above)    CKD- stable P:  Trend BMET/ mag/ phos, UOP, daily wts  Anemia - stable P:  Trend CBC  Best practice:  Diet: NPO Pain/Anxiety/Delirium protocol (if indicated): as needed VAP protocol (if indicated): yes DVT prophylaxis: heparin sq/ scds GI prophylaxis: pepcid Glucose control: SSI Mobility: BR Code Status: Full Family Communication: no family present Disposition: ICU  Labs   CBC: Recent Labs  Lab 07/27/18 1500 07/27/18 1518  WBC 8.4  --   NEUTROABS 6.4  --   HGB 9.4* 9.2*  HCT 28.5* 27.0*  MCV 97.6  --   PLT 203  --     Basic Metabolic Panel: Recent Labs  Lab 07/27/18 1500  07/27/18 1518  NA 138 138  K 4.4 4.4  CL 105 103  CO2 25  --   GLUCOSE 233* 232*  BUN 43* 38*  CREATININE 2.33* 2.40*  CALCIUM 9.0  --    GFR: Estimated Creatinine Clearance: 26.3 mL/min (A) (by C-G formula based on SCr of 2.4 mg/dL (H)). Recent Labs  Lab 07/27/18 1500  WBC 8.4    Liver Function Tests: Recent Labs  Lab 07/27/18 1500  AST 20  ALT 28  ALKPHOS 89  BILITOT 0.3  PROT 6.8  ALBUMIN 3.2*   No results for input(s): LIPASE, AMYLASE in the last 168 hours. No results for input(s): AMMONIA in the last 168 hours.  ABG    Component Value Date/Time   PHART 7.311 (L) 07/27/2018 1630   PCO2ART 50.5 (H) 07/27/2018 1630   PO2ART 131 (H) 07/27/2018 1630   HCO3 23.8 07/27/2018 1630   TCO2 27 07/27/2018 1518   ACIDBASEDEF 0.7 07/27/2018 1630   O2SAT 98.8 07/27/2018 1630     Coagulation Profile: Recent Labs  Lab 07/27/18 1500  INR 0.91    Cardiac Enzymes: No results for  input(s): CKTOTAL, CKMB, CKMBINDEX, TROPONINI in the last 168 hours.  HbA1C: Hgb A1c MFr Bld  Date/Time Value Ref Range Status  01/12/2018 06:24 PM 6.8 (H) 4.8 - 5.6 % Final    Comment:    (NOTE) Pre diabetes:          5.7%-6.4% Diabetes:              >6.4% Glycemic control for   <7.0% adults with diabetes   10/13/2016 03:30 AM 5.6 4.8 - 5.6 % Final    Comment:    (NOTE)         Pre-diabetes: 5.7 - 6.4         Diabetes: >6.4         Glycemic control for adults with diabetes: <7.0     CBG: Recent Labs  Lab 07/27/18 1510  GLUCAP 260*    Review of Systems:   Unable  Past Medical History  She,  has a past medical history of Anxiety, Asthma, Back pain, Barrett esophagus, CHF (congestive heart failure) ([place]), CKD (chronic kidney disease) stage 3, GFR 30-59 ml/min (HCC), COPD (chronic obstructive pulmonary disease) ([place]), Depression, Diastolic heart failure ([place]), Essential hypertension, Fibromyalgia, GERD (gastroesophageal reflux disease), Gout, History of pericarditis,  Hyperlipidemia, Hypothyroid, Major depressive disorder, Memory loss, Nephrolithiasis, Obesity hypoventilation syndrome ([place]), Obstructive sleep apnea, PE (pulmonary embolism), Peripheral neuropathy, PTSD (post-traumatic stress disorder), Rheumatoid arthritis ([place]), Schizophrenia ([place]), Steroid dependence, Type 2 diabetes mellitus ([place]), and Vitamin D deficiency.   Surgical History    Past Surgical History:  Procedure Laterality Date  . APPENDECTOMY    . CHOLECYSTECTOMY    . COLONOSCOPY     benign polyps (12/2000), repeat colonoscopy performed in 2007  . ENDOMETRIAL BIOPSY     10/03 benign columnar mucosa (limited material)  . KIDNEY SURGERY       Social History   reports that she quit smoking about 46 years ago. Her smoking use included cigarettes. She has never used smokeless tobacco. She reports that she does not drink alcohol or use drugs.   Family History   Her family history includes Bone cancer in her mother; COPD in her father; Heart disease in her father and mother; Hypertension in her mother; Stroke in her father.   Allergies Allergies  Allergen Reactions  . Benztropine Mesylate Other (See Comments)    Alopecia and white spots on eyes  . Codeine Swelling, Palpitations and Other (See Comments)    Mouth Swelling and Tachycardia   . Diazepam Other (See Comments)    COMA  . Diphenhydramine Hcl Anxiety, Palpitations and Other (See Comments)    Tachycardia and anxiousness  . Penicillins Other (See Comments)    REACTION: ulcers in mouth, swelling, throat swelling  Has patient had a PCN reaction causing immediate rash, facial/tongue/throat swelling, SOB or lightheadedness with hypotension: YES Has patient had a PCN reaction causing severe rash involving Mucus membranes or skin necrosis: Unknown Has patient had a PCN reaction that required hospitalization Unknown Has patient had a PCN reaction occurring within the last 10 years: unknown If all of the above answers are "NO", then  may proceed with Cephalospori  . Sulfonamide Derivatives Rash    Blisters, also  . Thioridazine Hcl Swelling  . Benztropine Other (See Comments)    "Alopecia and white spots on eyes"  . Penicillin G Swelling and Other (See Comments)    Ulcers in mouth & tongue became swollen Has patient had a PCN reaction causing immediate rash, facial/tongue/throat swelling, SOB or  lightheadedness with hypotension: Yes Has patient had a PCN reaction causing severe rash involving mucus membranes or skin necrosis: Unk Has patient had a PCN reaction that required hospitalization: Unk Has patient had a PCN reaction occurring within the last 10 years: Unk If all of the above answers are "NO", then may proceed with Cephalosporin use.   [name] Kitchen Lisinopril Cough  . Sulfamethoxazole Rash     Home Medications  Prior to Admission medications   Medication Sig Start Date End Date Taking? Authorizing Provider  acetaminophen (TYLENOL) 325 MG tablet Take 325 mg by mouth 2 (two) times daily.    Yes [provider]  albuterol (PROVENTIL HFA;VENTOLIN HFA) 108 (90 Base) MCG/ACT inhaler Inhale 2 puffs into the lungs every 6 (six) hours as needed for wheezing or shortness of breath.   Yes [provider]  albuterol (PROVENTIL) (2.5 MG/3ML) 0.083% nebulizer solution Take 2.5 mg by nebulization every 6 (six) hours as needed for wheezing.    Yes [provider]  aspirin 81 MG tablet Take 81 mg by mouth daily.   Yes [provider]  atorvastatin (LIPITOR) 40 MG tablet Take 40 mg by mouth at bedtime.    Yes [provider]  budesonide-formoterol (SYMBICORT) 160-4.5 MCG/ACT inhaler Inhale 2 puffs into the lungs 2 (two) times daily.   Yes [provider]  carvedilol (COREG) 25 MG tablet Take 25 mg by mouth 2 (two) times daily with a meal.   Yes [provider]  Cholecalciferol (VITAMIN D3) 50 MCG (2000 UT) TABS Take 4,000 Units by mouth daily.   Yes [provider]    docusate sodium (COLACE) 100 MG capsule Take 100 mg by mouth daily.    Yes [provider]  Dulaglutide (TRULICITY) 1.5 MW/4.1LK SOPN Inject 1.5 mg into the skin every Friday.    Yes [provider]  DULoxetine (CYMBALTA) 60 MG capsule Take 60 mg by mouth daily.   Yes [provider]  EPINEPHrine (EPI-PEN) 0.3 mg/0.3 mL DEVI Inject 0.3 mg into the muscle once as needed (for bee stings, then go to the ED immediately after using).    Yes [provider]  gabapentin (NEURONTIN) 300 MG capsule Take 300-600 mg by mouth See admin instructions. Take 300 mg by mouth two times a day and 600 mg at bedtime 04/29/17  Yes [provider]  glipiZIDE (GLUCOTROL XL) 5 MG 24 hr tablet Take 5 mg by mouth 2 (two) times daily.    Yes [provider]  Glycerin, Laxative, (FLEET LIQUID GLYCERIN SUPP RE) Place 1 suppository rectally daily as needed (for constipation).   Yes [provider]  hydrocortisone 2.5 % ointment Apply 1 application topically daily as needed (itchy skin).   Yes [provider]  hydrOXYzine (VISTARIL) 25 MG capsule Take 25 mg by mouth 2 (two) times daily as needed for anxiety.    Yes [provider]  insulin degludec (TRESIBA FLEXTOUCH) 100 UNIT/ML SOPN FlexTouch Pen Inject 20 Units into the skin at bedtime.   Yes [provider]  levothyroxine (SYNTHROID, LEVOTHROID) 25 MCG tablet Take 25 mcg by mouth daily before breakfast.   Yes [provider]  linagliptin (TRADJENTA) 5 MG TABS tablet Take 5 mg by mouth daily.   Yes [provider]  loperamide (IMODIUM) 2 MG capsule Take 2 mg by mouth 3 (three) times daily as needed (for diarrhea).    Yes [provider]  LORazepam (ATIVAN) 1 MG tablet Take 1 mg by mouth  daily as needed for anxiety.    Yes [provider]  losartan (COZAAR) 100 MG tablet Take 100 mg by mouth daily.   Yes [provider]  montelukast (SINGULAIR) 10  MG tablet Take 10 mg by mouth at bedtime.   Yes [provider]  Multiple Vitamin (DAILY VITE PO) Take 1 tablet by mouth daily.   Yes [provider]  omeprazole (PRILOSEC) 20 MG capsule Take 20 mg by mouth daily.    Yes [provider]  polyethylene glycol powder (GLYCOLAX/MIRALAX) powder Take 17 g by mouth daily as needed (for constipation).   Yes [provider]  prazosin (MINIPRESS) 2 MG capsule Take 2 mg by mouth at bedtime.    Yes [provider]  pyridOXINE (VITAMIN B-6) 100 MG tablet Take 200 mg by mouth 2 (two) times daily.   Yes [provider]  risperiDONE (RISPERDAL) 0.5 MG tablet Take 0.5 mg by mouth 2 (two) times daily.    Yes [provider]  spironolactone (ALDACTONE) 25 MG tablet Take 1 tablet (25 mg total) by mouth daily. 04/12/18  Yes Mesner, Corene Cornea, MD  torsemide (DEMADEX) 20 MG tablet Take 60 mg alternating with 40 mg daily. Patient taking differently: Take 40 mg by mouth daily.  07/26/18  Yes Strader, [place] M, PA-C  traZODone (DESYREL) 100 MG tablet Take 100 mg by mouth at bedtime as needed for sleep.    Yes [provider]  doxycycline (VIBRAMYCIN) 100 MG capsule Take 1 capsule (100 mg total) by mouth 2 (two) times daily. Patient not taking: Reported on 07/27/2018 04/27/18   Noemi Chapel, MD     Critical care time: 27 mins     Kennieth Rad, [place] Pgr: [phone] or if no answer [phone] 07/27/2018, 9:53 PM

## 2018-07-27 NOTE — Progress Notes (Signed)
[facility] Progress Note Patient Name: [name] DOB: [birth year] MRN: [id number]   Date of Service  07/27/2018  HPI/Events of Note  Acute encephalopathy with left sided weakness and slurring speech from nursing home,  Workup including MRI and tox screen negative. Intubated for airway protection.  eICU Interventions  Agree with ruling out seizure/Todd's paralysis. Keppra loaded emprically, on propofol for sedation.     Intervention Category Major Interventions: Change in mental status - evaluation and management;Airway management Evaluation Type: New Patient Evaluation  Judd Lien 07/27/2018, 11:51 PM

## 2018-07-27 NOTE — Sedation Documentation (Signed)
Positive color change on co2 detector, bilateral breath sounds ausculated.  Tube secured at 21cm at lip

## 2018-07-27 NOTE — Progress Notes (Signed)
Pt transported to MRI via vent with no complications noted. Pt transported back to ED.

## 2018-07-27 NOTE — Progress Notes (Signed)
CODE STROKE 1446 CALL TIME [place] EXAM COMPLETED IN EPIC [place] EPIC

## 2018-07-27 NOTE — ED Triage Notes (Signed)
Pt resident of [facility].  Staff reports pt was eating at 1230 and noticed she was eating slower than usual and would drift off to sleep.  After lunch, they noticed pt was still very drowsy so they called ems.  EMS says pt was arousable to verbal and painful stimuli, unable to follow commands.  CBG 279.  BP 763 systolic.  Pt usually verbal and is wheelchair bound.  EMS says pt has not been verbal with them pta.

## 2018-07-27 NOTE — ED Notes (Signed)
ED Provider at bedside. 

## 2018-07-27 NOTE — Consult Note (Signed)
Referring Physician: Dr. Ellender Hose    Chief Complaint: Brainstem stroke  HPI: [name] is an 67 y.o. 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 Her NIHSS was 27 at the OSH. LKN was 0800. She was per report by the OSH ED physician, aphasic, gazing tonically to the right and flaccid in all 4 extremities. She required intubation for airway protection after her initial stroke evaluation at [facility]. Her GCS was 6 prior to intubation.   Prior to intubation, she had been evaluated by Teleneurology with the following history obtained by the Teleneurologist: "67 year old woman with history of schizophrenia living in a facility. She is wheelchair-bound but normally alert and oriented and conversing. She was last seen normal at 0800 this morning by staff at approximately noon was found less responsive unable to communicate. When she arrived the hospital she was moving a right arm but an eyesore she was moving nothing. She has a history of hypertension, diabetes, congestive heart failure, pericarditis but no evidence of atrial fibrillation. She is not on anticoagulation noting a history of pulmonary emboli."  CT head at [facility] showed no acute hemorrhage or acute core infarct. An old lacunar infarction in the left putamen was noted. There was possible cortical encephalomalacia in the superior left temporal lobe by the insula.  She was not a candidate for tPA based on time criteria. Since a brainstem stroke was suspected, she was emergently transported to [facility] for possible endovascular treatment. Initial history by Teleneurology implied viable candidacy for endovascular treatment. She arrived to the [facility] ED intubated and sedated with 5 mcg/kg/hr of propofol. The paralytic for intubation had been administered approximately 1.5 hours prior to arrival to [facility].   On arrival to [facility] additional history of possible impairment sufficient to  exclude her as a candidate for VIR was obtained. Her assisted living facility was called and it was verified that despite being conversant, she was also dependent on staff for dressing, although she could feed herself. In the context of being wheelchair bound, her mRS was estimated to be 3-4, which is an exclusion criterion for endovascular treatment based on guidelines. Dr. Estanislado Pandy and I discussed this at length and we are in consensus agreement that endovascular treatment is not indicated.   MRI brain showed no acute infarction. MRA of head showed no LVO.   LSN: [id number] tPA Given: No: Out of time window.   Past Medical History:  Diagnosis Date  . Anxiety   . Asthma   . Back pain   . Barrett esophagus   . CHF (congestive heart failure) ([place])   . CKD (chronic kidney disease) stage 3, GFR 30-59 ml/min (HCC)   . COPD (chronic obstructive pulmonary disease) ([place])   . Depression   . Diastolic heart failure ([place])   . Essential hypertension   . Fibromyalgia   . GERD (gastroesophageal reflux disease)   . Gout   . History of pericarditis   . Hyperlipidemia   . Hypothyroid   . Major depressive disorder   . Memory loss   . Nephrolithiasis    2008  . Obesity hypoventilation syndrome (HCC)    CPAP  . Obstructive sleep apnea   . PE (pulmonary embolism)    2010  . Peripheral neuropathy   . PTSD (post-traumatic stress disorder)   . Rheumatoid arthritis ([place])   . Schizophrenia ([place])   . Steroid dependence   . Type 2 diabetes mellitus (  [place])   . Vitamin D deficiency     Past Surgical History:  Procedure Laterality Date  . APPENDECTOMY    . CHOLECYSTECTOMY    . COLONOSCOPY     benign polyps (12/2000), repeat colonoscopy performed in 2007  . ENDOMETRIAL BIOPSY     10/03 benign columnar mucosa (limited material)  . KIDNEY SURGERY      Family History  Problem Relation Age of Onset  . Bone cancer Mother   . Hypertension Mother   . Heart disease Mother   . COPD Father   . Heart  disease Father   . Stroke Father    Social History:  reports that she quit smoking about 46 years ago. Her smoking use included cigarettes. She has never used smokeless tobacco. She reports that she does not drink alcohol or use drugs.  Allergies:  Allergies  Allergen Reactions  . Benztropine Mesylate Other (See Comments)    Alopecia and white spots on eyes  . Codeine Swelling, Palpitations and Other (See Comments)    Mouth Swelling and Tachycardia   . Diazepam Other (See Comments)    COMA  . Diphenhydramine Hcl Anxiety, Palpitations and Other (See Comments)    Tachycardia and anxiousness  . Penicillins Other (See Comments)    REACTION: ulcers in mouth, swelling, throat swelling  Has patient had a PCN reaction causing immediate rash, facial/tongue/throat swelling, SOB or lightheadedness with hypotension: YES Has patient had a PCN reaction causing severe rash involving Mucus membranes or skin necrosis: Unknown Has patient had a PCN reaction that required hospitalization Unknown Has patient had a PCN reaction occurring within the last 10 years: unknown If all of the above answers are "NO", then may proceed with Cephalospori  . Sulfonamide Derivatives Rash    Blisters, also  . Thioridazine Hcl Swelling  . Benztropine Other (See Comments)    "Alopecia and white spots on eyes"  . Penicillin G Swelling and Other (See Comments)    Ulcers in mouth & tongue became swollen Has patient had a PCN reaction causing immediate rash, facial/tongue/throat swelling, SOB or lightheadedness with hypotension: Yes Has patient had a PCN reaction causing severe rash involving mucus membranes or skin necrosis: Unk Has patient had a PCN reaction that required hospitalization: Unk Has patient had a PCN reaction occurring within the last 10 years: Unk If all of the above answers are "NO", then may proceed with Cephalosporin use.   [name] Kitchen Lisinopril Cough  . Sulfamethoxazole Rash    Medications:  Prior to  Admission: (Not in a hospital admission)  Scheduled: . [START ON 07/28/2018] famotidine  20 mg Oral Daily  . heparin  5,000 Units Subcutaneous Q8H  . insulin aspart  0-15 Units Subcutaneous Q4H   Continuous: . levETIRAcetam    . propofol (DIPRIVAN) infusion 5 mcg/kg/min (07/27/18 1556)    ROS: Unable to obtain due to coma/sedation.   Physical Examination: Blood pressure (!) 148/75, pulse 87, resp. rate 20, height 5\' 3"  (1.6 m), weight 102 kg, SpO2 99 %.  HEENT: [place]/AT. Intubated.  Lungs: On ventilator.  Ext: No cyanosis.   Neurologic Examination: Exam limited by sedation and possible residual effect of paralytic agent. Ment: No responses to any external stimuli and no spontaneous movement.  CN: Pupils 2 mm and unreactive. No blink to threat. No blink to eyelash stimulation. No oculocephalic reflex. No grimace to noxious brow ridge pressure. Face flaccidly symmetric.  Motor: Flaccid tone x 4 without movement spontaneously or to any stimuli, except for flickering  of left toe to plantar stimulation.   Sensory. No lower extremity withdrawal to any stimuli. Flickering of toe as above may be a weak Babinski.  Reflexes: Hypoactive x 4  Results for orders placed or performed during the hospital encounter of 07/27/18 (from the past 48 hour(s))  Ethanol     Status: None   Collection Time: 07/27/18  3:00 PM  Result Value Ref Range   Alcohol, Ethyl (B) <10 <10 mg/dL    Comment: Performed at [facility], [address] [place], [place] [address]  Protime-INR     Status: None   Collection Time: 07/27/18  3:00 PM  Result Value Ref Range   Prothrombin Time 12.2 11.4 - 15.2 seconds   INR 0.91     Comment: Performed at [facility], [address] [place], [place] [address]  APTT     Status: None   Collection Time: 07/27/18  3:00 PM  Result Value Ref Range   aPTT 28 24 - 36 seconds    Comment: Performed at [facility], [address], [place], [place] [address]  CBC     Status: Abnormal    Collection Time: 07/27/18  3:00 PM  Result Value Ref Range   WBC 8.4 4.0 - 10.5 K/uL   RBC 2.92 (L) 3.87 - 5.11 MIL/uL   Hemoglobin 9.4 (L) 12.0 - 15.0 g/dL   HCT 28.5 (L) 36.0 - 46.0 %   MCV 97.6 80.0 - 100.0 fL   MCH 32.2 26.0 - 34.0 pg   MCHC 33.0 30.0 - 36.0 g/dL   RDW 13.7 11.5 - 15.5 %   Platelets 203 150 - 400 K/uL   nRBC 0.0 0.0 - 0.2 %    Comment: Performed at [facility], [address] [place], [place] [address]  Differential     Status: None   Collection Time: 07/27/18  3:00 PM  Result Value Ref Range   Neutrophils Relative % 76 %   Neutro Abs 6.4 1.7 - 7.7 K/uL   Lymphocytes Relative 11 %   Lymphs Abs 0.9 0.7 - 4.0 K/uL   Monocytes Relative 8 %   Monocytes Absolute 0.7 0.1 - 1.0 K/uL   Eosinophils Relative 4 %   Eosinophils Absolute 0.3 0.0 - 0.5 K/uL   Basophils Relative 0 %   Basophils Absolute 0.0 0.0 - 0.1 K/uL   Immature Granulocytes 1 %   Abs Immature Granulocytes 0.04 0.00 - 0.07 K/uL    Comment: Performed at [facility], [address] [place],  [address]  Comprehensive metabolic panel     Status: Abnormal   Collection Time: 07/27/18  3:00 PM  Result Value Ref Range   Sodium 138 135 - 145 mmol/L   Potassium 4.4 3.5 - 5.1 mmol/L   Chloride 105 98 - 111 mmol/L   CO2 25 22 - 32 mmol/L   Glucose, Bld 233 (H) 70 - 99 mg/dL   BUN 43 (H) 8 - 23 mg/dL   Creatinine, Ser 2.33 (H) 0.44 - 1.00 mg/dL   Calcium 9.0 8.9 - 10.3 mg/dL   Total Protein 6.8 6.5 - 8.1 g/dL   Albumin 3.2 (L) 3.5 - 5.0 g/dL   AST 20 15 - 41 U/L   ALT 28 0 - 44 U/L   Alkaline Phosphatase 89 38 - 126 U/L   Total Bilirubin 0.3 0.3 - 1.2 mg/dL   GFR calc non Af Amer 21 (L) >60 mL/min   GFR calc Af Amer 24 (L) >60 mL/min  Anion gap 8 5 - 15    Comment: Performed at [facility], [address], [place], [place] [address]  CBG monitoring, ED     Status: Abnormal   Collection Time: 07/27/18  3:10 PM  Result Value Ref Range   Glucose-Capillary 260 (H) 70 - 99 mg/dL  I-stat  troponin, ED     Status: None   Collection Time: 07/27/18  3:16 PM  Result Value Ref Range   Troponin i, poc 0.00 0.00 - 0.08 ng/mL   Comment 3            Comment: Due to the release kinetics of cTnI, a negative result within the first hours of the onset of symptoms does not rule out myocardial infarction with certainty. If myocardial infarction is still suspected, repeat the test at appropriate intervals.   I-Stat Chem 8, ED     Status: Abnormal   Collection Time: 07/27/18  3:18 PM  Result Value Ref Range   Sodium 138 135 - 145 mmol/L   Potassium 4.4 3.5 - 5.1 mmol/L   Chloride 103 98 - 111 mmol/L   BUN 38 (H) 8 - 23 mg/dL   Creatinine, Ser 2.40 (H) 0.44 - 1.00 mg/dL   Glucose, Bld 232 (H) 70 - 99 mg/dL   Calcium, Ion 1.24 1.15 - 1.40 mmol/L   TCO2 27 22 - 32 mmol/L   Hemoglobin 9.2 (L) 12.0 - 15.0 g/dL   HCT 27.0 (L) 36.0 - 46.0 %  Urine rapid drug screen (hosp performed)     Status: None   Collection Time: 07/27/18  4:30 PM  Result Value Ref Range   Opiates NONE DETECTED NONE DETECTED   Cocaine NONE DETECTED NONE DETECTED   Benzodiazepines NONE DETECTED NONE DETECTED   Amphetamines NONE DETECTED NONE DETECTED   Tetrahydrocannabinol NONE DETECTED NONE DETECTED   Barbiturates NONE DETECTED NONE DETECTED    Comment: (NOTE) DRUG SCREEN FOR MEDICAL PURPOSES ONLY.  IF CONFIRMATION IS NEEDED FOR ANY PURPOSE, NOTIFY LAB WITHIN 5 DAYS. LOWEST DETECTABLE LIMITS FOR URINE DRUG SCREEN Drug Class                     Cutoff (ng/mL) Amphetamine and metabolites    1000 Barbiturate and metabolites    200 Benzodiazepine                 676 Tricyclics and metabolites     300 Opiates and metabolites        300 Cocaine and metabolites        300 THC                            50 Performed at [facility], [address] [place], [place] [address]   Urinalysis, Routine w reflex microscopic     Status: Abnormal   Collection Time: 07/27/18  4:30 PM  Result Value Ref Range   Color,  Urine STRAW (A) YELLOW   APPearance CLEAR CLEAR   Specific Gravity, Urine 1.006 1.005 - 1.030   pH 6.0 5.0 - 8.0   Glucose, UA 50 (A) NEGATIVE mg/dL   Hgb urine dipstick NEGATIVE NEGATIVE   Bilirubin Urine NEGATIVE NEGATIVE   Ketones, ur NEGATIVE NEGATIVE mg/dL   Protein, ur 100 (A) NEGATIVE mg/dL   Nitrite NEGATIVE NEGATIVE   Leukocytes, UA NEGATIVE NEGATIVE   RBC / HPF 0-5 0 - 5 RBC/hpf   WBC, UA 0-5 0 - 5  WBC/hpf   Bacteria, UA RARE (A) NONE SEEN    Comment: Performed at [facility], [address] [place], [place] [address]  Blood gas, arterial ([facility] & [facility] ONLY)     Status: Abnormal   Collection Time: 07/27/18  4:30 PM  Result Value Ref Range   FIO2 50.00    Delivery systems VENTILATOR    Mode PRESSURE REGULATED VOLUME CONTROL    VT 420 mL   LHR 18 resp/min   Peep/cpap 5.0 cm H20   pH, Arterial 7.311 (L) 7.350 - 7.450   pCO2 arterial 50.5 (H) 32.0 - 48.0 mmHg   pO2, Arterial 131 (H) 83.0 - 108.0 mmHg   Bicarbonate 23.8 20.0 - 28.0 mmol/L   Acid-base deficit 0.7 0.0 - 2.0 mmol/L   O2 Saturation 98.8 %   Patient temperature 37.0    Collection site RIGHT RADIAL    Drawn by [id number]    Sample type ARTERIAL DRAW    Allens test (pass/fail) PASS PASS    Comment: Performed at [facility], [address] [place],  [address]   Mr Jodene Nam Head Wo Contrast  Result Date: 07/27/2018 CLINICAL DATA:  Acute onset somnolence.  Altered mental status. EXAM: MRI HEAD WITHOUT CONTRAST MRA HEAD WITHOUT CONTRAST TECHNIQUE: Multiplanar, multiecho pulse sequences of the brain and surrounding structures were obtained without intravenous contrast. Angiographic images of the head were obtained using MRA technique without contrast. COMPARISON:  Head CT 07/27/2018 Brain MRI 04/19/2018 FINDINGS: An abbreviated protocol was performed. Sagittal T1, axial T2, axial T1 and coronal T2-weighted imaging was not obtained. MRI HEAD FINDINGS BRAIN: There is no acute infarct, acute hemorrhage or mass effect. The  midline structures are normal. There are no old infarcts. Multifocal white matter hyperintensity, most commonly due to chronic ischemic microangiopathy. Mild generalized atrophy. SKULL AND UPPER CERVICAL SPINE: The visualized skull base, calvarium, upper cervical spine and extracranial soft tissues are normal. SINUSES/ORBITS: No fluid levels or advanced mucosal thickening. No mastoid or middle ear effusion. The orbits are normal. MRA HEAD FINDINGS Intracranial internal carotid arteries: Normal. Anterior cerebral arteries: Normal. Middle cerebral arteries: Normal. Posterior communicating arteries: Present on the right only. Posterior cerebral arteries: Normal. Basilar artery: Normal. Vertebral arteries: Left dominant. Normal. Superior cerebellar arteries: Normal. Inferior cerebellar arteries: Normal. IMPRESSION: 1. No acute intracranial abnormality. 2. Findings of chronic microvascular ischemia. 3. Normal intracranial MRA. Electronically Signed   By: Ulyses Jarred M.D.   On: 07/27/2018 19:56   Mr Brain Wo Contrast  Result Date: 07/27/2018 CLINICAL DATA:  Acute onset somnolence.  Altered mental status. EXAM: MRI HEAD WITHOUT CONTRAST MRA HEAD WITHOUT CONTRAST TECHNIQUE: Multiplanar, multiecho pulse sequences of the brain and surrounding structures were obtained without intravenous contrast. Angiographic images of the head were obtained using MRA technique without contrast. COMPARISON:  Head CT 07/27/2018 Brain MRI 04/19/2018 FINDINGS: An abbreviated protocol was performed. Sagittal T1, axial T2, axial T1 and coronal T2-weighted imaging was not obtained. MRI HEAD FINDINGS BRAIN: There is no acute infarct, acute hemorrhage or mass effect. The midline structures are normal. There are no old infarcts. Multifocal white matter hyperintensity, most commonly due to chronic ischemic microangiopathy. Mild generalized atrophy. SKULL AND UPPER CERVICAL SPINE: The visualized skull base, calvarium, upper cervical spine and  extracranial soft tissues are normal. SINUSES/ORBITS: No fluid levels or advanced mucosal thickening. No mastoid or middle ear effusion. The orbits are normal. MRA HEAD FINDINGS Intracranial internal carotid arteries: Normal. Anterior cerebral arteries: Normal. Middle cerebral arteries: Normal. Posterior communicating arteries:  Present on the right only. Posterior cerebral arteries: Normal. Basilar artery: Normal. Vertebral arteries: Left dominant. Normal. Superior cerebellar arteries: Normal. Inferior cerebellar arteries: Normal. IMPRESSION: 1. No acute intracranial abnormality. 2. Findings of chronic microvascular ischemia. 3. Normal intracranial MRA. Electronically Signed   By: Ulyses Jarred M.D.   On: 07/27/2018 19:56   Dg Chest Portable 1 View  Result Date: 07/27/2018 CLINICAL DATA:  Tube placement. EXAM: PORTABLE CHEST 1 VIEW COMPARISON:  07/14/2018. FINDINGS: Endotracheal tube tip noted approximately 7 mm above the lower portion of the carina. Proximal repositioning of approximately 2 cm should be considered. NG tube noted with tip below left hemidiaphragm. Cardiomegaly with pulmonary venous congestion bilateral interstitial prominence. Findings consistent with CHF. No pneumothorax. IMPRESSION: 1. Endotracheal tube noted with its tip approximately 7 mm above the lower portion of the carina. Proximal repositioning of approximately 2 cm should be considered. NG tube noted with tip below left hemidiaphragm. 2. Cardiomegaly with pulmonary venous congestion bilateral interstitial prominence consistent with CHF. Electronically Signed   By: Marcello Moores  Register   On: 07/27/2018 16:23   Ct Head Code Stroke Wo Contrast  Result Date: 07/27/2018 CLINICAL DATA:  Code stroke.  Altered mental status EXAM: CT HEAD WITHOUT CONTRAST TECHNIQUE: Contiguous axial images were obtained from the base of the skull through the vertex without intravenous contrast. COMPARISON:  Head CT 04/18/2018 FINDINGS: Brain: There is no  mass, hemorrhage or extra-axial collection. The size and configuration of the ventricles and extra-axial CSF spaces are normal. There is a prominent perivascular space of the left lentiform nucleus, unchanged. Mild white matter hypoattenuation compatible with chronic small vessel disease. Vascular: No abnormal hyperdensity of the major intracranial arteries or dural venous sinuses. No intracranial atherosclerosis. Skull: The visualized skull base, calvarium and extracranial soft tissues are normal. Sinuses/Orbits: No fluid levels or advanced mucosal thickening of the visualized paranasal sinuses. No mastoid or middle ear effusion. The orbits are normal. ASPECTS [facility] Stroke Program Early CT Score) - Ganglionic level infarction (caudate, lentiform nuclei, internal capsule, insula, M1-M3 cortex): 7 - Supraganglionic infarction (M4-M6 cortex): 3 Total score (0-10 with 10 being normal): 10 IMPRESSION: 1. No hemorrhage or other acute abnormality. 2. ASPECTS is 10. These results were called by telephone at the time of interpretation on 07/27/2018 at 3:07 pm to Dr. Nanda Quinton , who verbally acknowledged these results. Electronically Signed   By: Ulyses Jarred M.D.   On: 07/27/2018 15:08    Assessment: 67 y.o. female with acute onset of flaccid tetraplegia, gaze deviation and aphasia.  1. Clinical description at OSH was suggestive of brainstem stroke.  2. Was not a candidate for IV tPA per Teleneurologist based on time criteria.  3. Was tranferred to [facility] for possible endovascular treatment. However, mRS was estimated to be 3-4 based on new history obtained after arrival and MRA head showed no LVO.  4. MRI brain shows no acute infarction.  5. Given negative imaging, the DDx now expands to include unwitnessed seizure with postictal state versis nonconvulsive status epilepticus. An MRI negative brainstem stroke is still possible.    Plan: 1. STAT EEG.  2. Load with Keppra 2000 mg IV x 1. Although seizure is  felt to be relatively less likely than MRI-negative brainstem stroke, the benefits of an anticonvulsant are felt to outweigh the risks.  3. Frequent neuro checks 4. Ventilator and medical management per CCM.  5. Cardiac telemetry  80 minutes spent in the emergent neurological evaluation and management of this critically ill patient.  Time spent included coordination of care.   @Electronically  signed: Dr. Kerney Elbe  07/27/2018, 8:29 PM

## 2018-07-28 ENCOUNTER — Inpatient Hospital Stay (HOSPITAL_COMMUNITY): Payer: Medicare Other

## 2018-07-28 DIAGNOSIS — J9602 Acute respiratory failure with hypercapnia: Secondary | ICD-10-CM

## 2018-07-28 DIAGNOSIS — J9601 Acute respiratory failure with hypoxia: Secondary | ICD-10-CM

## 2018-07-28 LAB — MRSA PCR SCREENING: MRSA by PCR: NEGATIVE

## 2018-07-28 LAB — BASIC METABOLIC PANEL
Anion gap: 14 (ref 5–15)
BUN: 43 mg/dL — ABNORMAL HIGH (ref 8–23)
CO2: 25 mmol/L (ref 22–32)
Calcium: 8.9 mg/dL (ref 8.9–10.3)
Chloride: 102 mmol/L (ref 98–111)
Creatinine, Ser: 2.37 mg/dL — ABNORMAL HIGH (ref 0.44–1.00)
GFR calc Af Amer: 24 mL/min — ABNORMAL LOW (ref >60)
GFR calc non Af Amer: 21 mL/min — ABNORMAL LOW (ref >60)
Glucose, Bld: 130 mg/dL — ABNORMAL HIGH (ref 70–99)
Potassium: 4.1 mmol/L (ref 3.5–5.1)
Sodium: 141 mmol/L (ref 135–145)

## 2018-07-28 LAB — GLUCOSE, CAPILLARY
Glucose-Capillary: 158 mg/dL — ABNORMAL HIGH (ref 70–99)
Glucose-Capillary: 113 mg/dL — ABNORMAL HIGH (ref 70–99)
Glucose-Capillary: 113 mg/dL — ABNORMAL HIGH (ref 70–99)
Glucose-Capillary: 105 mg/dL — ABNORMAL HIGH (ref 70–99)
Glucose-Capillary: 89 mg/dL (ref 70–99)
Glucose-Capillary: 92 mg/dL (ref 70–99)

## 2018-07-28 LAB — CBC
HCT: 26.5 % — ABNORMAL LOW (ref 36.0–46.0)
Hemoglobin: 8.8 g/dL — ABNORMAL LOW (ref 12.0–15.0)
MCH: 32 pg (ref 26.0–34.0)
MCHC: 33.2 g/dL (ref 30.0–36.0)
MCV: 96.4 fL (ref 80.0–100.0)
Platelets: 123 10*3/uL — ABNORMAL LOW (ref 150–400)
RBC: 2.75 MIL/uL — ABNORMAL LOW (ref 3.87–5.11)
RDW: 13.6 % (ref 11.5–15.5)
WBC: 10.3 10*3/uL (ref 4.0–10.5)
nRBC: 0 % (ref 0.0–0.2)

## 2018-07-28 LAB — TRIGLYCERIDES: Triglycerides: 237 mg/dL — ABNORMAL HIGH (ref <150)

## 2018-07-28 LAB — MAGNESIUM: Magnesium: 2 mg/dL (ref 1.7–2.4)

## 2018-07-28 LAB — PHOSPHORUS: Phosphorus: 4.8 mg/dL — ABNORMAL HIGH (ref 2.5–4.6)

## 2018-07-28 LAB — TSH: TSH: 2.047 u[IU]/mL (ref 0.350–4.500)

## 2018-07-28 MED ORDER — PROPOFOL 1000 MG/100ML IV EMUL
5.0000 ug/kg/min | INTRAVENOUS | Status: DC
  Administered 2018-07-28: 20 ug/kg/min via INTRAVENOUS

## 2018-07-28 NOTE — Procedures (Signed)
History: 67 year old female being evaluated for altered mental status  Sedation: Propofol  Technique: This is a 21 channel routine scalp EEG performed at the bedside with bipolar and monopolar montages arranged in accordance to the international 10/20 system of electrode placement. One channel was dedicated to EKG recording.    Background: There is mild generalized irregular delta activity with frequent brief runs of bifrontally predominant beta activity.  Following painful stimulation, there is an appearance of a posterior dominant rhythm of 10 Hz which is briefly seen.  Photic stimulation: Physiologic driving is not performed  EEG Abnormalities: 1) generalized irregular slow activity  Clinical Interpretation: This EEG was consistent with a mild generalized nonspecific cerebral dysfunction (encephalopathy).  This can be seen with sedating medications among other causes.   There was no seizure or seizure predisposition recorded on this study. Please note that lack of epileptiform activity on EEG does not preclude the possibility of epilepsy.   Roland Rack, MD [facility] [phone]  If 7pm- 7am, please page neurology on call as listed in [place].

## 2018-07-28 NOTE — Procedures (Signed)
Extubation Procedure Note  Patient Details:   Name: [name] DOB: 12/23/1950 MRN: [id number]   Airway Documentation:    Vent end date: 07/28/18 Vent end time: 1113   Evaluation  O2 sats: stable throughout Complications: No apparent complications Patient did tolerate procedure well. Bilateral Breath Sounds: Clear, Diminished   Yes   Patient extubated to 3L [place] without complications. Positive cuff leak noted. No stridor noted. RN at bedside. Will continue to monitor.   Herbie [place] 07/28/2018, 11:21 AM

## 2018-07-28 NOTE — Progress Notes (Signed)
NAMECaro Cross, MRN:  [id number], DOB:  Jan 28, 1951, LOS: 1 ADMISSION DATE:  07/27/2018, CONSULTATION DATE:  07/27/2018 REFERRING MD:  Dr. Cheral Marker, CHIEF COMPLAINT:  AMS  Brief History   67 yoF presenting with acute encephalopathy and weakness.  Intubated for airway protection.  Initial stroke workup negative.  Unclear etiology for encephalopathy at this time.  PCCM to admit.  History of present illness   HPI obtained from is encephalopathic, intubated, and mechanically ventilated.  67 year old female with history significant for schizophrenia, depression, diastolic heart failure, HTN, HLD, DMT2, CKD stage IV, asthma, chronic anemia, chronic pain, GERD, hypothyroid, OSA on CPAP who is a resident at [place] [facility] who presented to [facility] ED for altered mental status.  At baseline, patient is wheelchair bound, verbal, and conversant.  She is dependent on her ADL but able to feed herself.  LSW at 0800 on 12/12 and noted at lunch to be very drowsy and drifting off sleep.  CBG 279.  On arrival to ER, patient initially with GCS of 6 ([id number]), aphasic with purposeful movement of RUE but then progressed to completely flaccid.  Intubated for airway protection. There was concern for acute stroke and code stroke activated. NIHSS 27.  CT head negative.  Not a candidate for TPA based on time criteria.  Decision was made to transfer patient to [facility] for MRI/ MRA for concern of brainstem stroke and possible IR.  Labs noted for negative UA, neg troponin, non acute EKG, neg LFTs, sCr 2.33 (which is around baseline), WBC 8.4, Hgb 9.4 (baseline 9), UDS negative, ETOH neg.  CXR with vascular congestion.  She has been afebrile and normotensive.  MRI/ MRA at [facility] with no acute findings. Endovascular treatment ruled out by Neuro/ Neuro IR based on additional history based on guidelines and MRA that did not show LVO.  No apparent infectious etiology.  Neurology following.  Some concern for seizure vs  postictal state vs brainstem stroke although MRI negative.  PCCM to admit.   Past Medical History  Former smoker, schizophrenia, depression, anxiety, PTSD, OHS/ OSA on CPAP, diastolic heart failure, HTN, HLD, DMT2, CKD stage IV, asthma, chronic anemia, chronic pain, GERD, hypothyroid, PE (2010), RA, gout, fibromyalgia  Significant Hospital Events   12/12 Admitted  Consults:  Neurology  Procedures:  12/12 ETT >>  Significant Diagnostic Tests:  12/12 CTH >> negative; ASPECTS 10  12/12 MRI/MRA >> 1. No acute intracranial abnormality. 2. Findings of chronic microvascular ischemia. 3. Normal intracranial MRA.  Micro Data:   Antimicrobials:   Interim history/subjective:  More awake, tolerating pressure support  Objective   Blood pressure 127/73, pulse 88, temperature 99.4 F (37.4 C), temperature source Oral, resp. rate 20, height 5\' 3"  (1.6 m), weight 99.4 kg, SpO2 96 %.    Vent Mode: PRVC FiO2 (%):  [40 %-50 %] 40 % Set Rate:  [18 bmp-20 bmp] 20 bmp Vt Set:  [420 mL] 420 mL PEEP:  [5 cmH20] 5 cmH20 Pressure Support:  [5 cmH20] 5 cmH20 Plateau Pressure:  [17 cmH20-19 cmH20] 17 cmH20   Intake/Output Summary (Last 24 hours) at 07/28/2018 0908 Last data filed at 07/28/2018 0600 Gross per 24 hour  Intake 85.68 ml  Output 2160 ml  Net -2074.32 ml   Filed Weights   07/27/18 1507 07/28/18 0451  Weight: 102 kg 99.4 kg   Examination: General: Obese elderly woman, mechanically ventilated, no distress HEENT: ET tube in good position, oropharynx moist, pupils 3 mm and react Neuro:  Awake, nods to questions, follows commands, good cough on request CV: Regular, no murmur PULM: Good air movement, somewhat diminished at the bases GI: Soft, nondistended, positive bowel sounds Extremities: 1+ lower extremity edema Skin: No rash  Resolved Hospital Problem list    Assessment & Plan:  Acute encephalopathy  - unclear etiology at this point - ddx broad- concern for brainstem  stroke still possibility per Neuro even though MRA negative vs ?seizure/ postictal states vs NCSE; question sedating medications.  EEG is reassuring - no apparent infectious etiology, UDS neg, TSH was normal as of 04/2018, no significant electrolyte abnormalities - no apparent changes in pysch meds, appreciate pharmacy review.  Patient taking risperidone.  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  Acute respiratory insufficiency related to above Hx asthma/ OSA on CPAP/ OHS P:  Tolerating pressure support and may be an extubation candidate today. Follow intermittent chest x-ray Continue her Brovana and Pulmicort in place of Symbicort for now Albuterol as needed Singulair Likely will need CPAP or BiPAP nightly once extubated  HTN, HLD, diastolic HF P:  Lipitor aspirin Hold losartan, carvedilol, spironolactone, torsemide.  Restart slowly as blood pressure will allow and based on volume status.  DMT2 P:  Sliding scale insulin as per protocol  Hypothyroidism P:  Continue Synthroid  Hx anxiety/ depression/ schizophrenia/ PTSD P:  As above holding her sedating medications Ativan, trazodone. Careful when time to restart her prazosin, hydroxyzine, Neurontin, Cymbalta.  As above her risperidone is on hold  CKD- stable P:  Follow BMP, urine output  Anemia - stable P:  Follow CBC  Best practice:  Diet: NPO Pain/Anxiety/Delirium protocol (if indicated): as needed VAP protocol (if indicated): yes DVT prophylaxis: heparin sq/ scds GI prophylaxis: pepcid Glucose control: SSI Mobility: BR Code Status: Full Family Communication: No family present 12/13 Disposition: ICU  Labs   CBC: Recent Labs    Lab 07/27/18 1500 07/27/18 1518 07/28/18 0404  WBC 8.4  --  10.3  NEUTROABS 6.4  --   --   HGB 9.4* 9.2* 8.8*  HCT 28.5* 27.0* 26.5*  MCV 97.6  --  96.4  PLT 203  --  123*    Basic Metabolic Panel: Recent Labs  Lab 07/27/18 1500 07/27/18 1518 07/28/18 0404  NA 138 138 141  K 4.4 4.4 4.1  CL 105 103 102  CO2 25  --  25  GLUCOSE 233* 232* 130*  BUN 43* 38* 43*  CREATININE 2.33* 2.40* 2.37*  CALCIUM 9.0  --  8.9  MG  --   --  2.0  PHOS  --   --  4.8*   GFR: Estimated Creatinine Clearance: 26.2 mL/min (A) (by C-G formula based on SCr of 2.37 mg/dL (H)). Recent Labs  Lab 07/27/18 1500 07/28/18 0404  WBC 8.4 10.3    Liver Function Tests: Recent Labs  Lab 07/27/18 1500  AST 20  ALT 28  ALKPHOS 89  BILITOT 0.3  PROT 6.8  ALBUMIN 3.2*   No results for input(s): LIPASE, AMYLASE in the last 168 hours. No results for input(s): AMMONIA in the last 168 hours.  ABG    Component Value Date/Time   PHART 7.311 (L) 07/27/2018 1630   PCO2ART 50.5 (H) 07/27/2018 1630  PO2ART 131 (H) 07/27/2018 1630   HCO3 23.8 07/27/2018 1630   TCO2 27 07/27/2018 1518   ACIDBASEDEF 0.7 07/27/2018 1630   O2SAT 98.8 07/27/2018 1630     Coagulation Profile: Recent Labs  Lab 07/27/18 1500  INR 0.91    Cardiac Enzymes: No results for input(s): CKTOTAL, CKMB, CKMBINDEX, TROPONINI in the last 168 hours.  HbA1C: Hgb A1c MFr Bld  Date/Time Value Ref Range Status  01/12/2018 06:24 PM 6.8 (H) 4.8 - 5.6 % Final    Comment:    (NOTE) Pre diabetes:          5.7%-6.4% Diabetes:              >6.4% Glycemic control for   <7.0% adults with diabetes   10/13/2016 03:30 AM 5.6 4.8 - 5.6 % Final    Comment:    (NOTE)         Pre-diabetes: 5.7 - 6.4         Diabetes: >6.4         Glycemic control for adults with diabetes: <7.0     CBG: Recent Labs  Lab 07/27/18 1510 07/27/18 2115 07/27/18 2321 07/28/18 0312 07/28/18 0719  GLUCAP 260* 208* 172* 158* 113*     Critical  care time: 34 mins     Baltazar Apo, MD, PhD 07/28/2018, 10:33 AM [facility] [phone] or if no answer [phone]

## 2018-07-28 NOTE — Progress Notes (Signed)
Subjective: Per RN, the patient is waking up. EEG yesterday night showed no electrographic seizures.  Objective: Current vital signs: BP 127/73 (BP Location: Right Arm)   Pulse 88   Temp 99.4 F (37.4 C) (Oral)   Resp 20   Ht 5' 3" (1.6 m)   Wt 99.4 kg   SpO2 96%   BMI 38.82 kg/m  Vital signs in last 24 hours: Temp:  [97.5 F (36.4 C)-99.4 F (37.4 C)] 99.4 F (37.4 C) (12/13 0721) Pulse Rate:  [77-92] 88 (12/13 0800) Resp:  [10-100] 20 (12/13 0800) BP: (84-191)/(47-85) 127/73 (12/13 0800) SpO2:  [96 %-100 %] 96 % (12/13 0800) FiO2 (%):  [40 %-50 %] 40 % (12/13 0953) Weight:  [99.4 kg-102 kg] 99.4 kg (12/13 0451)  Intake/Output from previous day: 12/12 0701 - 12/13 0700 In: 85.7 [I.V.:85.7] Out: 2160 [Urine:2160] Intake/Output this shift: No intake/output data recorded. Nutritional status:  Diet Order            Diet NPO time specified  Diet effective now              Neurologic Exam: Intubated and with residual sedation from propofol which was stopped 45 minuts prior to exam - rate prior to stopping was 30 mcg/kg/min Ment: Opens eyes to voice. Will follow simple motor commands. Will gaze towards examiner.  CN: EOMI with saccadic visual pursuits. PERRL. Fixates upon and tracks examiner visually.  MotorSensory: Will squeeze examiner's hands, flex and extend at elbows with 2/5 strength. Will wiggle toes of left foot and withdraw right leg with 2/5 strength to noxious.  Reflexes: Hypoactive x 4  Lab Results: Results for orders placed or performed during the hospital encounter of 07/27/18 (from the past 48 hour(s))  Ethanol     Status: None   Collection Time: 07/27/18  3:00 PM  Result Value Ref Range   Alcohol, Ethyl (B) <10 <10 mg/dL    Comment: Performed at [facility], [address] [place], [place] [address]  Protime-INR     Status: None   Collection Time: 07/27/18  3:00 PM  Result Value Ref Range   Prothrombin Time 12.2 11.4 - 15.2 seconds   INR 0.91      Comment: Performed at [facility], [address] [place], [place] [address]  APTT     Status: None   Collection Time: 07/27/18  3:00 PM  Result Value Ref Range   aPTT 28 24 - 36 seconds    Comment: Performed at [facility], [address] [place], [place] [address]  CBC     Status: Abnormal   Collection Time: 07/27/18  3:00 PM  Result Value Ref Range   WBC 8.4 4.0 - 10.5 K/uL   RBC 2.92 (L) 3.87 - 5.11 MIL/uL   Hemoglobin 9.4 (L) 12.0 - 15.0 g/dL   HCT 28.5 (L) 36.0 - 46.0 %   MCV 97.6 80.0 - 100.0 fL   MCH 32.2 26.0 - 34.0 pg   MCHC 33.0 30.0 - 36.0 g/dL   RDW 13.7 11.5 - 15.5 %   Platelets 203 150 - 400 K/uL   nRBC 0.0 0.0 - 0.2 %    Comment: Performed at [facility], [address] [place], [place] [address]  Differential     Status: None   Collection Time: 07/27/18  3:00 PM  Result Value Ref Range   Neutrophils Relative % 76 %   Neutro Abs 6.4 1.7 - 7.7 K/uL   Lymphocytes Relative 11 %  Lymphs Abs 0.9 0.7 - 4.0 K/uL   Monocytes Relative 8 %   Monocytes Absolute 0.7 0.1 - 1.0 K/uL   Eosinophils Relative 4 %   Eosinophils Absolute 0.3 0.0 - 0.5 K/uL   Basophils Relative 0 %   Basophils Absolute 0.0 0.0 - 0.1 K/uL   Immature Granulocytes 1 %   Abs Immature Granulocytes 0.04 0.00 - 0.07 K/uL    Comment: Performed at [facility], [address], [place], [place] [address]  Comprehensive metabolic panel     Status: Abnormal   Collection Time: 07/27/18  3:00 PM  Result Value Ref Range   Sodium 138 135 - 145 mmol/L   Potassium 4.4 3.5 - 5.1 mmol/L   Chloride 105 98 - 111 mmol/L   CO2 25 22 - 32 mmol/L   Glucose, Bld 233 (H) 70 - 99 mg/dL   BUN 43 (H) 8 - 23 mg/dL   Creatinine, Ser 2.33 (H) 0.44 - 1.00 mg/dL   Calcium 9.0 8.9 - 10.3 mg/dL   Total Protein 6.8 6.5 - 8.1 g/dL   Albumin 3.2 (L) 3.5 - 5.0 g/dL   AST 20 15 - 41 U/L   ALT 28 0 - 44 U/L   Alkaline Phosphatase 89 38 - 126 U/L   Total Bilirubin 0.3 0.3 - 1.2 mg/dL   GFR calc non Af Amer 21 (L) >60 mL/min    GFR calc Af Amer 24 (L) >60 mL/min   Anion gap 8 5 - 15    Comment: Performed at [facility], [address], [place], [place] [address]  CBG monitoring, ED     Status: Abnormal   Collection Time: 07/27/18  3:10 PM  Result Value Ref Range   Glucose-Capillary 260 (H) 70 - 99 mg/dL  I-stat troponin, ED     Status: None   Collection Time: 07/27/18  3:16 PM  Result Value Ref Range   Troponin i, poc 0.00 0.00 - 0.08 ng/mL   Comment 3            Comment: Due to the release kinetics of cTnI, a negative result within the first hours of the onset of symptoms does not rule out myocardial infarction with certainty. If myocardial infarction is still suspected, repeat the test at appropriate intervals.   I-Stat Chem 8, ED     Status: Abnormal   Collection Time: 07/27/18  3:18 PM  Result Value Ref Range   Sodium 138 135 - 145 mmol/L   Potassium 4.4 3.5 - 5.1 mmol/L   Chloride 103 98 - 111 mmol/L   BUN 38 (H) 8 - 23 mg/dL   Creatinine, Ser 2.40 (H) 0.44 - 1.00 mg/dL   Glucose, Bld 232 (H) 70 - 99 mg/dL   Calcium, Ion 1.24 1.15 - 1.40 mmol/L   TCO2 27 22 - 32 mmol/L   Hemoglobin 9.2 (L) 12.0 - 15.0 g/dL   HCT 27.0 (L) 36.0 - 46.0 %  Urine rapid drug screen (hosp performed)     Status: None   Collection Time: 07/27/18  4:30 PM  Result Value Ref Range   Opiates NONE DETECTED NONE DETECTED   Cocaine NONE DETECTED NONE DETECTED   Benzodiazepines NONE DETECTED NONE DETECTED   Amphetamines NONE DETECTED NONE DETECTED   Tetrahydrocannabinol NONE DETECTED NONE DETECTED   Barbiturates NONE DETECTED NONE DETECTED    Comment: (NOTE) DRUG SCREEN FOR MEDICAL PURPOSES ONLY.  IF CONFIRMATION IS NEEDED FOR ANY PURPOSE, NOTIFY LAB WITHIN 5 DAYS. LOWEST DETECTABLE LIMITS  FOR URINE DRUG SCREEN Drug Class                     Cutoff (ng/mL) Amphetamine and metabolites    1000 Barbiturate and metabolites    200 Benzodiazepine                 485 Tricyclics and metabolites     300 Opiates and  metabolites        300 Cocaine and metabolites        300 THC                            50 Performed at [facility], [address], [place], [place] [address]   Urinalysis, Routine w reflex microscopic     Status: Abnormal   Collection Time: 07/27/18  4:30 PM  Result Value Ref Range   Color, Urine STRAW (A) YELLOW   APPearance CLEAR CLEAR   Specific Gravity, Urine 1.006 1.005 - 1.030   pH 6.0 5.0 - 8.0   Glucose, UA 50 (A) NEGATIVE mg/dL   Hgb urine dipstick NEGATIVE NEGATIVE   Bilirubin Urine NEGATIVE NEGATIVE   Ketones, ur NEGATIVE NEGATIVE mg/dL   Protein, ur 100 (A) NEGATIVE mg/dL   Nitrite NEGATIVE NEGATIVE   Leukocytes, UA NEGATIVE NEGATIVE   RBC / HPF 0-5 0 - 5 RBC/hpf   WBC, UA 0-5 0 - 5 WBC/hpf   Bacteria, UA RARE (A) NONE SEEN    Comment: Performed at [facility], [address] [place], [place] [address]  Blood gas, arterial ([facility] & [facility] ONLY)     Status: Abnormal   Collection Time: 07/27/18  4:30 PM  Result Value Ref Range   FIO2 50.00    Delivery systems VENTILATOR    Mode PRESSURE REGULATED VOLUME CONTROL    VT 420 mL   LHR 18 resp/min   Peep/cpap 5.0 cm H20   pH, Arterial 7.311 (L) 7.350 - 7.450   pCO2 arterial 50.5 (H) 32.0 - 48.0 mmHg   pO2, Arterial 131 (H) 83.0 - 108.0 mmHg   Bicarbonate 23.8 20.0 - 28.0 mmol/L   Acid-base deficit 0.7 0.0 - 2.0 mmol/L   O2 Saturation 98.8 %   Patient temperature 37.0    Collection site RIGHT RADIAL    Drawn by [id number]    Sample type ARTERIAL DRAW    Allens test (pass/fail) PASS PASS    Comment: Performed at [facility], [address], [place], [place] [address]  CBG monitoring, ED     Status: Abnormal   Collection Time: 07/27/18  9:15 PM  Result Value Ref Range   Glucose-Capillary 208 (H) 70 - 99 mg/dL  Glucose, capillary     Status: Abnormal   Collection Time: 07/27/18 11:21 PM  Result Value Ref Range   Glucose-Capillary 172 (H) 70 - 99 mg/dL  MRSA PCR Screening     Status: None   Collection Time: 07/27/18 11:56  PM  Result Value Ref Range   MRSA by PCR NEGATIVE NEGATIVE    Comment:        The GeneXpert MRSA Assay (FDA approved for NASAL specimens only), is one component of a comprehensive MRSA colonization surveillance program. It is not intended to diagnose MRSA infection nor to guide or monitor treatment for MRSA infections. Performed at [facility], [place] [address] [place], [place] [address]   Glucose, capillary     Status: Abnormal  Collection Time: 07/28/18  3:12 AM  Result Value Ref Range   Glucose-Capillary 158 (H) 70 - 99 mg/dL   Comment 1 Notify RN   CBC     Status: Abnormal   Collection Time: 07/28/18  4:04 AM  Result Value Ref Range   WBC 10.3 4.0 - 10.5 K/uL   RBC 2.75 (L) 3.87 - 5.11 MIL/uL   Hemoglobin 8.8 (L) 12.0 - 15.0 g/dL   HCT 26.5 (L) 36.0 - 46.0 %   MCV 96.4 80.0 - 100.0 fL   MCH 32.0 26.0 - 34.0 pg   MCHC 33.2 30.0 - 36.0 g/dL   RDW 13.6 11.5 - 15.5 %   Platelets 123 (L) 150 - 400 K/uL   nRBC 0.0 0.0 - 0.2 %    Comment: Performed at [facility], [place] [address] [place], [place] [address]  Basic metabolic panel     Status: Abnormal   Collection Time: 07/28/18  4:04 AM  Result Value Ref Range   Sodium 141 135 - 145 mmol/L   Potassium 4.1 3.5 - 5.1 mmol/L   Chloride 102 98 - 111 mmol/L   CO2 25 22 - 32 mmol/L   Glucose, Bld 130 (H) 70 - 99 mg/dL   BUN 43 (H) 8 - 23 mg/dL   Creatinine, Ser 2.37 (H) 0.44 - 1.00 mg/dL   Calcium 8.9 8.9 - 10.3 mg/dL   GFR calc non Af Amer 21 (L) >60 mL/min   GFR calc Af Amer 24 (L) >60 mL/min   Anion gap 14 5 - 15    Comment: Performed at [facility], [place] [address], [place], [place] [address]  Magnesium     Status: None   Collection Time: 07/28/18  4:04 AM  Result Value Ref Range   Magnesium 2.0 1.7 - 2.4 mg/dL    Comment: Performed at [facility] [address] [place], [place] [address]  Phosphorus     Status: Abnormal   Collection Time: 07/28/18  4:04 AM  Result Value Ref Range    Phosphorus 4.8 (H) 2.5 - 4.6 mg/dL    Comment: Performed at [facility] [address], [place], [place] [address]  TSH     Status: None   Collection Time: 07/28/18  4:04 AM  Result Value Ref Range   TSH 2.047 0.350 - 4.500 uIU/mL    Comment: Performed by a 3rd Generation assay with a functional sensitivity of <=0.01 uIU/mL. Performed at [facility], [place] [address], [place], [place] [address]   Triglycerides     Status: Abnormal   Collection Time: 07/28/18  4:04 AM  Result Value Ref Range   Triglycerides 237 (H) <150 mg/dL    Comment: Performed at [facility] [address], [place], [place] [address]  Glucose, capillary     Status: Abnormal   Collection Time: 07/28/18  7:19 AM  Result Value Ref Range   Glucose-Capillary 113 (H) 70 - 99 mg/dL    Recent Results (from the past 240 hour(s))  MRSA PCR Screening     Status: None   Collection Time: 07/27/18 11:56 PM  Result Value Ref Range Status   MRSA by PCR NEGATIVE NEGATIVE Final    Comment:        The GeneXpert MRSA Assay (FDA approved for NASAL specimens only), is one component of a comprehensive MRSA colonization surveillance program. It is not intended to diagnose MRSA infection nor to guide  or monitor treatment for MRSA infections. Performed at [facility], [place] [address], [place], [place] [address]     Lipid Panel Recent Labs    07/28/18 0404  TRIG 237*    Studies/Results: Mr Virgel Paling IE Contrast  Result Date: 07/27/2018 CLINICAL DATA:  Acute onset somnolence.  Altered mental status. EXAM: MRI HEAD WITHOUT CONTRAST MRA HEAD WITHOUT CONTRAST TECHNIQUE: Multiplanar, multiecho pulse sequences of the brain and surrounding structures were obtained without intravenous contrast. Angiographic images of the head were obtained using MRA technique without contrast. COMPARISON:  Head CT 07/27/2018 Brain MRI 04/19/2018 FINDINGS: An abbreviated protocol was performed. Sagittal T1, axial T2, axial  T1 and coronal T2-weighted imaging was not obtained. MRI HEAD FINDINGS BRAIN: There is no acute infarct, acute hemorrhage or mass effect. The midline structures are normal. There are no old infarcts. Multifocal white matter hyperintensity, most commonly due to chronic ischemic microangiopathy. Mild generalized atrophy. SKULL AND UPPER CERVICAL SPINE: The visualized skull base, calvarium, upper cervical spine and extracranial soft tissues are normal. SINUSES/ORBITS: No fluid levels or advanced mucosal thickening. No mastoid or middle ear effusion. The orbits are normal. MRA HEAD FINDINGS Intracranial internal carotid arteries: Normal. Anterior cerebral arteries: Normal. Middle cerebral arteries: Normal. Posterior communicating arteries: Present on the right only. Posterior cerebral arteries: Normal. Basilar artery: Normal. Vertebral arteries: Left dominant. Normal. Superior cerebellar arteries: Normal. Inferior cerebellar arteries: Normal. IMPRESSION: 1. No acute intracranial abnormality. 2. Findings of chronic microvascular ischemia. 3. Normal intracranial MRA. Electronically Signed   By: Ulyses Jarred M.D.   On: 07/27/2018 19:56   Mr Brain Wo Contrast  Result Date: 07/27/2018 CLINICAL DATA:  Acute onset somnolence.  Altered mental status. EXAM: MRI HEAD WITHOUT CONTRAST MRA HEAD WITHOUT CONTRAST TECHNIQUE: Multiplanar, multiecho pulse sequences of the brain and surrounding structures were obtained without intravenous contrast. Angiographic images of the head were obtained using MRA technique without contrast. COMPARISON:  Head CT 07/27/2018 Brain MRI 04/19/2018 FINDINGS: An abbreviated protocol was performed. Sagittal T1, axial T2, axial T1 and coronal T2-weighted imaging was not obtained. MRI HEAD FINDINGS BRAIN: There is no acute infarct, acute hemorrhage or mass effect. The midline structures are normal. There are no old infarcts. Multifocal white matter hyperintensity, most commonly due to chronic ischemic  microangiopathy. Mild generalized atrophy. SKULL AND UPPER CERVICAL SPINE: The visualized skull base, calvarium, upper cervical spine and extracranial soft tissues are normal. SINUSES/ORBITS: No fluid levels or advanced mucosal thickening. No mastoid or middle ear effusion. The orbits are normal. MRA HEAD FINDINGS Intracranial internal carotid arteries: Normal. Anterior cerebral arteries: Normal. Middle cerebral arteries: Normal. Posterior communicating arteries: Present on the right only. Posterior cerebral arteries: Normal. Basilar artery: Normal. Vertebral arteries: Left dominant. Normal. Superior cerebellar arteries: Normal. Inferior cerebellar arteries: Normal. IMPRESSION: 1. No acute intracranial abnormality. 2. Findings of chronic microvascular ischemia. 3. Normal intracranial MRA. Electronically Signed   By: Ulyses Jarred M.D.   On: 07/27/2018 19:56   Dg Chest Port 1 View  Result Date: 07/28/2018 CLINICAL DATA:  Hypoxia EXAM: PORTABLE CHEST 1 VIEW COMPARISON:  July 27, 2018 FINDINGS: Endotracheal tube tip is 2.2 cm above the carina. Nasogastric tube tip and side port are below the diaphragm. No pneumothorax. There is atelectatic change in the lung bases. The lungs elsewhere are clear. Heart is mildly enlarged with pulmonary vascularity normal. No adenopathy. No appreciable bone lesions. IMPRESSION: Tube positions as described without pneumothorax. Bibasilar atelectasis. No frank airspace consolidation. Stable cardiac prominence. Electronically Signed  By: Lowella Grip III M.D.   On: 07/28/2018 07:22   Dg Chest Portable 1 View  Result Date: 07/27/2018 CLINICAL DATA:  Tube placement. EXAM: PORTABLE CHEST 1 VIEW COMPARISON:  07/14/2018. FINDINGS: Endotracheal tube tip noted approximately 7 mm above the lower portion of the carina. Proximal repositioning of approximately 2 cm should be considered. NG tube noted with tip below left hemidiaphragm. Cardiomegaly with pulmonary venous congestion  bilateral interstitial prominence. Findings consistent with CHF. No pneumothorax. IMPRESSION: 1. Endotracheal tube noted with its tip approximately 7 mm above the lower portion of the carina. Proximal repositioning of approximately 2 cm should be considered. NG tube noted with tip below left hemidiaphragm. 2. Cardiomegaly with pulmonary venous congestion bilateral interstitial prominence consistent with CHF. Electronically Signed   By: Marcello Moores  Register   On: 07/27/2018 16:23   Ct Head Code Stroke Wo Contrast  Result Date: 07/27/2018 CLINICAL DATA:  Code stroke.  Altered mental status EXAM: CT HEAD WITHOUT CONTRAST TECHNIQUE: Contiguous axial images were obtained from the base of the skull through the vertex without intravenous contrast. COMPARISON:  Head CT 04/18/2018 FINDINGS: Brain: There is no mass, hemorrhage or extra-axial collection. The size and configuration of the ventricles and extra-axial CSF spaces are normal. There is a prominent perivascular space of the left lentiform nucleus, unchanged. Mild white matter hypoattenuation compatible with chronic small vessel disease. Vascular: No abnormal hyperdensity of the major intracranial arteries or dural venous sinuses. No intracranial atherosclerosis. Skull: The visualized skull base, calvarium and extracranial soft tissues are normal. Sinuses/Orbits: No fluid levels or advanced mucosal thickening of the visualized paranasal sinuses. No mastoid or middle ear effusion. The orbits are normal. ASPECTS [facility] Stroke Program Early CT Score) - Ganglionic level infarction (caudate, lentiform nuclei, internal capsule, insula, M1-M3 cortex): 7 - Supraganglionic infarction (M4-M6 cortex): 3 Total score (0-10 with 10 being normal): 10 IMPRESSION: 1. No hemorrhage or other acute abnormality. 2. ASPECTS is 10. These results were called by telephone at the time of interpretation on 07/27/2018 at 3:07 pm to Dr. Nanda Quinton , who verbally acknowledged these results.  Electronically Signed   By: Ulyses Jarred M.D.   On: 07/27/2018 15:08    Medications:  Scheduled: . arformoterol  15 mcg Nebulization BID  . aspirin  81 mg Per Tube Daily  . atorvastatin  40 mg Per Tube q1800  . budesonide (PULMICORT) nebulizer solution  0.5 mg Nebulization BID  . chlorhexidine gluconate (MEDLINE KIT)  15 mL Mouth Rinse BID  . famotidine  20 mg Oral Daily  . heparin  5,000 Units Subcutaneous Q8H  . insulin aspart  0-15 Units Subcutaneous Q4H  . levothyroxine  25 mcg Per Tube Q0600  . mouth rinse  15 mL Mouth Rinse 10 times per day  . montelukast  10 mg Per Tube QHS   Continuous: . propofol (DIPRIVAN) infusion 20 mcg/kg/min (07/28/18 0031)   EEG from yesterday night: Clinical Interpretation: This EEG was consistent with a mild generalized nonspecific cerebral dysfunction (encephalopathy).  This can be seen with sedating medications among other causes. There was no seizure or seizure predisposition recorded on this study.    Assessment: 67 y.o. female with acute onset of flaccid tetraplegia, gaze deviation and aphasia. Initially thought to be due to a brainstem stroke, her MRI was negative. EEG also with cortical activity consistent wita mild encephalopathy.  1. DDx now includes possible medication overdose at skilled nursing facility versus severe catatonia.  2. Unwitnessed seizure with postictal state is also  a differential diagnostic consideration. An MRI-negative brainstem stroke is felt to be unlikely.   Recommendations: 1. Hold off on further Keppra.  2. Frequent neuro checks 3. Ventilator and medical management per CCM.  4. Cardiac telemetry 5. Neurology will continue to follow  35 minutes spent in the evaluation and management of this critically ill patient.    LOS: 1 day   _0  signed: Dr. Kerney Elbe 07/28/2018  10:01 AM

## 2018-07-28 NOTE — Evaluation (Signed)
Clinical/Bedside Swallow Evaluation Patient Details  Name: [name] MRN: [id number] Date of Birth: [birth year]  Today's Date: 07/28/2018 Time: SLP Start Time (ACUTE ONLY): 5701 SLP Stop Time (ACUTE ONLY): 1532 SLP Time Calculation (min) (ACUTE ONLY): 15 min  Past Medical History:  Past Medical History:  Diagnosis Date  . Anxiety   . Asthma   . Back pain   . Barrett esophagus   . CHF (congestive heart failure) ([place])   . CKD (chronic kidney disease) stage 3, GFR 30-59 ml/min (HCC)   . COPD (chronic obstructive pulmonary disease) ([place])   . Depression   . Diastolic heart failure ([place])   . Essential hypertension   . Fibromyalgia   . GERD (gastroesophageal reflux disease)   . Gout   . History of pericarditis   . Hyperlipidemia   . Hypothyroid   . Major depressive disorder   . Memory loss   . Nephrolithiasis    2008  . Obesity hypoventilation syndrome (HCC)    CPAP  . Obstructive sleep apnea   . PE (pulmonary embolism)    2010  . Peripheral neuropathy   . PTSD (post-traumatic stress disorder)   . Rheumatoid arthritis ([place])   . Schizophrenia ([place])   . Steroid dependence   . Type 2 diabetes mellitus ([place])   . Vitamin D deficiency    Past Surgical History:  Past Surgical History:  Procedure Laterality Date  . APPENDECTOMY    . CHOLECYSTECTOMY    . COLONOSCOPY     benign polyps (12/2000), repeat colonoscopy performed in 2007  . ENDOMETRIAL BIOPSY     10/03 benign columnar mucosa (limited material)  . KIDNEY SURGERY     HPI:  Pt is a 67 year old female with history significant for memory loss, PTSD, COPD, schizophrenia, depression, diastolic heart failure, HTN, HLD, DMT2, CKD stage IV, asthma, chronic anemia, chronic pain, GERD, barrett's esophagus, hypothyroid, OSA on CPAP who presented to [facility] ED from SNF for AMS. ETT 12/12-12/13. MRI negative for acute infarct. Pt had prior BSE in March 2017 with functional appearing oropharyngeal swallow.    Assessment /  Plan / Recommendation Clinical Impression  Despite brief intubation period, pt exhibits signs of a post-extubation dysphagia including significant dysphonia, weak cough, wet vocal quality with small amounts of ice/water, and facial grimacing upon swallowing. Cognitively she is drowsy and inconsistently following commands, not always coughing to command. Recommend additional time before starting PO diet. Would typically anticipate a quick recovery window, although pt has other barriers as well including severity of dysphonia, mentation, and her report of getting "choked all the time" PTA (question baseline esophageal issues given hx). She also says that she has had PNA "so many times she can't remember." Given her report, she may at least require instrumental testing prior to starting a diet. Will f/u for readiness. SLP Visit Diagnosis: Dysphagia, unspecified (R13.10)    Aspiration Risk  Moderate aspiration risk    Diet Recommendation NPO   Medication Administration: Via alternative means    Other  Recommendations Oral Care Recommendations: Oral care QID Other Recommendations: Order thickener from pharmacy;Prohibited food (jello, ice cream, thin soups);Remove water pitcher;Have oral suction available   Follow up Recommendations Skilled Nursing facility      Frequency and Duration min 2x/week  2 weeks       Prognosis Prognosis for Safe Diet Advancement: Good      Swallow Study   General HPI: Pt is a 67 year old female with history significant for memory loss, PTSD, COPD,  schizophrenia, depression, diastolic heart failure, HTN, HLD, DMT2, CKD stage IV, asthma, chronic anemia, chronic pain, GERD, barrett's esophagus, hypothyroid, OSA on CPAP who presented to [facility] ED from SNF for AMS. ETT 12/12-12/13. MRI negative for acute infarct. Pt had prior BSE in March 2017 with functional appearing oropharyngeal swallow.  Type of Study: Bedside Swallow Evaluation Previous Swallow Assessment: see  HPI Diet Prior to this Study: NPO Temperature Spikes Noted: No Respiratory Status: Nasal cannula History of Recent Intubation: Yes Length of Intubations (days): 1 days Date extubated: 07/28/18 Behavior/Cognition: Lethargic/Drowsy;Cooperative;Requires cueing Oral Cavity Assessment: Within Functional Limits Oral Care Completed by SLP: No Oral Cavity - Dentition: Poor condition;Missing dentition Self-Feeding Abilities: Total assist Patient Positioning: Upright in bed Baseline Vocal Quality: Hoarse;Low vocal intensity Volitional Cough: Weak(inconsistently) Volitional Swallow: Able to elicit    Oral/Motor/Sensory Function Overall Oral Motor/Sensory Function: (difficulty following commands for assessment)   Ice Chips Ice chips: Impaired Presentation: Spoon Pharyngeal Phase Impairments: Wet Vocal Quality;Other (comments)(facial grimacing)   Thin Liquid Thin Liquid: Impaired Presentation: Spoon Pharyngeal  Phase Impairments: Wet Vocal Quality;Other (comments)(facial grimacing)    Nectar Thick Nectar Thick Liquid: Not tested   Honey Thick Honey Thick Liquid: Not tested   Puree Puree: Not tested   Solid     Solid: Not tested      [name] 07/28/2018,4:18 PM  [name], M.A. [place] Acute Environmental education officer [phone] Office [phone]

## 2018-07-29 LAB — BASIC METABOLIC PANEL
Anion gap: 10 (ref 5–15)
BUN: 37 mg/dL — ABNORMAL HIGH (ref 8–23)
CO2: 23 mmol/L (ref 22–32)
Calcium: 8.6 mg/dL — ABNORMAL LOW (ref 8.9–10.3)
Chloride: 110 mmol/L (ref 98–111)
Creatinine, Ser: 2.27 mg/dL — ABNORMAL HIGH (ref 0.44–1.00)
GFR calc Af Amer: 25 mL/min — ABNORMAL LOW (ref >60)
GFR calc non Af Amer: 22 mL/min — ABNORMAL LOW (ref >60)
Glucose, Bld: 105 mg/dL — ABNORMAL HIGH (ref 70–99)
Potassium: 3.9 mmol/L (ref 3.5–5.1)
Sodium: 143 mmol/L (ref 135–145)

## 2018-07-29 LAB — GLUCOSE, CAPILLARY
Glucose-Capillary: 99 mg/dL (ref 70–99)
Glucose-Capillary: 92 mg/dL (ref 70–99)
Glucose-Capillary: 164 mg/dL — ABNORMAL HIGH (ref 70–99)
Glucose-Capillary: 227 mg/dL — ABNORMAL HIGH (ref 70–99)
Glucose-Capillary: 210 mg/dL — ABNORMAL HIGH (ref 70–99)

## 2018-07-29 LAB — CBC
HCT: 26.5 % — ABNORMAL LOW (ref 36.0–46.0)
Hemoglobin: 8.4 g/dL — ABNORMAL LOW (ref 12.0–15.0)
MCH: 31 pg (ref 26.0–34.0)
MCHC: 31.7 g/dL (ref 30.0–36.0)
MCV: 97.8 fL (ref 80.0–100.0)
Platelets: 182 10*3/uL (ref 150–400)
RBC: 2.71 MIL/uL — ABNORMAL LOW (ref 3.87–5.11)
RDW: 13.9 % (ref 11.5–15.5)
WBC: 7.1 10*3/uL (ref 4.0–10.5)
nRBC: 0 % (ref 0.0–0.2)

## 2018-07-29 LAB — MAGNESIUM: Magnesium: 2.2 mg/dL (ref 1.7–2.4)

## 2018-07-29 MED ORDER — LEVOTHYROXINE SODIUM 25 MCG PO TABS
25.0000 ug | ORAL_TABLET | Freq: Every day | ORAL | Status: DC
  Administered 2018-07-30 – 2018-08-01 (×3): 25 ug via ORAL
  Filled 2018-07-29 (×3): qty 1

## 2018-07-29 MED ORDER — GABAPENTIN 300 MG PO CAPS
300.0000 mg | ORAL_CAPSULE | Freq: Two times a day (BID) | ORAL | Status: DC
  Administered 2018-07-29 – 2018-08-01 (×7): 300 mg via ORAL
  Filled 2018-07-29 (×9): qty 1

## 2018-07-29 MED ORDER — RESOURCE THICKENUP CLEAR PO POWD
ORAL | Status: DC | PRN
  Filled 2018-07-29: qty 125

## 2018-07-29 MED ORDER — DULOXETINE HCL 60 MG PO CPEP
60.0000 mg | ORAL_CAPSULE | Freq: Every day | ORAL | Status: DC
  Administered 2018-07-29 – 2018-08-01 (×4): 60 mg via ORAL
  Filled 2018-07-29 (×5): qty 1

## 2018-07-29 MED ORDER — MONTELUKAST SODIUM 10 MG PO TABS
10.0000 mg | ORAL_TABLET | Freq: Every day | ORAL | Status: DC
  Administered 2018-07-29 – 2018-07-31 (×3): 10 mg via ORAL
  Filled 2018-07-29 (×3): qty 1

## 2018-07-29 MED ORDER — ASPIRIN 81 MG PO CHEW
81.0000 mg | CHEWABLE_TABLET | Freq: Every day | ORAL | Status: DC
  Administered 2018-07-29 – 2018-08-01 (×4): 81 mg via ORAL
  Filled 2018-07-29 (×4): qty 1

## 2018-07-29 MED ORDER — INSULIN ASPART 100 UNIT/ML ~~LOC~~ SOLN
0.0000 [IU] | Freq: Every day | SUBCUTANEOUS | Status: DC
  Administered 2018-07-29: 2 [IU] via SUBCUTANEOUS
  Administered 2018-07-30: 5 [IU] via SUBCUTANEOUS

## 2018-07-29 MED ORDER — GABAPENTIN 300 MG PO CAPS: 300.0000 mg | ORAL_CAPSULE | Freq: Two times a day (BID) | ORAL | Status: DC

## 2018-07-29 MED ORDER — DOCUSATE SODIUM 50 MG/5ML PO LIQD
100.0000 mg | Freq: Two times a day (BID) | ORAL | Status: DC | PRN
  Administered 2018-07-29: 100 mg via ORAL
  Filled 2018-07-29: qty 10

## 2018-07-29 MED ORDER — GABAPENTIN 300 MG PO CAPS
600.0000 mg | ORAL_CAPSULE | Freq: Every day | ORAL | Status: DC
  Administered 2018-07-29 – 2018-07-31 (×3): 600 mg via ORAL
  Filled 2018-07-29 (×3): qty 2

## 2018-07-29 MED ORDER — INSULIN ASPART 100 UNIT/ML ~~LOC~~ SOLN
0.0000 [IU] | Freq: Three times a day (TID) | SUBCUTANEOUS | Status: DC
  Administered 2018-07-29: 4 [IU] via SUBCUTANEOUS
  Administered 2018-07-29 – 2018-07-30 (×2): 7 [IU] via SUBCUTANEOUS
  Administered 2018-07-30: 11 [IU] via SUBCUTANEOUS
  Administered 2018-07-30: 7 [IU] via SUBCUTANEOUS
  Administered 2018-07-31: 4 [IU] via SUBCUTANEOUS
  Administered 2018-07-31 (×2): 7 [IU] via SUBCUTANEOUS
  Administered 2018-08-01: 4 [IU] via SUBCUTANEOUS

## 2018-07-29 MED ORDER — DOCUSATE SODIUM 100 MG PO CAPS
100.0000 mg | ORAL_CAPSULE | Freq: Two times a day (BID) | ORAL | Status: DC | PRN
  Administered 2018-07-30: 100 mg via ORAL
  Filled 2018-07-29: qty 1

## 2018-07-29 MED ORDER — GABAPENTIN 300 MG PO CAPS: 300.0000 mg | ORAL_CAPSULE | ORAL | Status: DC

## 2018-07-29 NOTE — Evaluation (Signed)
Physical Therapy Evaluation Patient Details Name: [name] MRN: [id number] DOB: 12-03-1950 Today's Date: 07/29/2018   History of Present Illness  45 yoF presenting with acute encephalopathy and weakness.  Intubated 12/12-12/13. Initial stroke workup negative.  Unclear etiology for encephalopathy at this time. PMH includes but not limited to: COPD, CKD, CHF, HLD, GERD, GOUT, HTN, PTSD, schizophrenia, DM2, memory loss, hypothyroid.    Clinical Impression  Pt admitted with above diagnosis. Pt currently with functional limitations due to the deficits listed below (see PT Problem List). PT reports living at [facility], reports she has not walked in 1 month or so, utilizes w/c. Patient with flat affect and increased time to follow cues and commands but able to participate with therapy. Requires hands on assistance for standing and transferring to chair today. Would like to attempt short distance ambulation next PT visit if able with close chair follow for safety.  Pt will benefit from skilled PT to increase their independence and safety with mobility to allow discharge to the venue listed below.       Follow Up Recommendations SNF    Equipment Recommendations  None recommended by PT    Recommendations for Other Services OT consult     Precautions / Restrictions Precautions Precautions: Fall Restrictions Weight Bearing Restrictions: No      Mobility  Bed Mobility Overal bed mobility: Needs Assistance Bed Mobility: Supine to Sit     Supine to sit: Min guard     General bed mobility comments: comes supine to sit with hands on garding, needs min A at times to stablize sitting balance  Transfers Overall transfer level: Needs assistance Equipment used: 2 person hand held assist Transfers: Sit to/from [facility] Sit to Stand: Min assist Stand pivot transfers: Min assist       General transfer comment: Min A to power up and provide stability to transfer  to chair for breakfast. pt with weakness unable to ambulate at this time but may attempt next visit.   Ambulation/Gait                Stairs            Wheelchair Mobility    Modified Rankin (Stroke Patients Only)       Balance Overall balance assessment: Needs assistance   Sitting balance-Leahy Scale: Poor       Standing balance-Leahy Scale: Poor                               Pertinent Vitals/Pain Pain Assessment: No/denies pain    Home Living Family/patient expects to be discharged to:: Skilled nursing facility                      Prior Function Level of Independence: Needs assistance         Comments: Pt reports not walking in a month, w/c at SNF     Hand Dominance        Extremity/Trunk Assessment   Upper Extremity Assessment Upper Extremity Assessment: Defer to OT evaluation    Lower Extremity Assessment Lower Extremity Assessment: (RLE strength 2+/5 gross, LLE 4-/5. dim light touch BLE  )       Communication   Communication: No difficulties  Cognition Arousal/Alertness: Awake/alert Behavior During Therapy: Flat affect Overall Cognitive Status: No family/caregiver present to determine baseline cognitive functioning  General Comments: AOx4, flat affect with slow processing during mobility, overall following cues and commands      General Comments      Exercises     Assessment/Plan    PT Assessment Patient needs continued PT services  PT Problem List Decreased strength       PT Treatment Interventions DME instruction;Gait training;Stair training;Therapeutic activities;Functional mobility training;Therapeutic exercise    PT Goals (Current goals can be found in the Care Plan section)  Acute Rehab PT Goals Patient Stated Goal: non stated PT Goal Formulation: With patient Time For Goal Achievement: 08/12/18 Potential to Achieve Goals: Fair    Frequency  Min 2X/week   Barriers to discharge        Co-evaluation               AM-PAC PT "6 Clicks" Mobility  Outcome Measure Help needed turning from your back to your side while in a flat bed without using bedrails?: A Little Help needed moving from lying on your back to sitting on the side of a flat bed without using bedrails?: A Little Help needed moving to and from a bed to a chair (including a wheelchair)?: A Little Help needed standing up from a chair using your arms (e.g., wheelchair or bedside chair)?: A Lot Help needed to walk in hospital room?: A Lot Help needed climbing 3-5 steps with a railing? : Total 6 Click Score: 14    End of Session Equipment Utilized During Treatment: Gait belt;Oxygen Activity Tolerance: Patient limited by fatigue Patient left: in chair;with call bell/phone within reach;with chair alarm set Nurse Communication: Mobility status PT Visit Diagnosis: Unsteadiness on feet (R26.81)    Time: 0900-0930 PT Time Calculation (min) (ACUTE ONLY): 30 min   Charges:   PT Evaluation $PT Eval Low Complexity: 1 Low PT Treatments $Therapeutic Activity: 8-22 mins      Reinaldo Berber, PT, DPT Acute Rehabilitation Services Pager: [phone] Office: [phone]    Reinaldo Berber 07/29/2018, 9:51 AM

## 2018-07-29 NOTE — Progress Notes (Signed)
  Speech Language Pathology Treatment: Dysphagia  Patient Details Name: Vici Novick MRN: [id number] DOB: 05-20-1951 Today's Date: 07/29/2018 Time: 8032-1224 SLP Time Calculation (min) (ACUTE ONLY): 16 min  Assessment / Plan / Recommendation Clinical Impression  MD initiated diet and pt eating breakfast during assessment. She reported odonophagia currently, frequent swallowing difficulty prior to hospital and coughing with liquids. Noted pt's history includes GERD, Barrett's esophagus. Audible swallow with delayed cough intermittently with thin juice and suspect may have acute on chronic dysphagia due to short period of intubation. Feel she would benefit from 1-2 days of thickener consistency liquids to facilitate respiratory/swallow coordination; Dys 3, nectar, pills whole in puree or crush if large. ST will continue for upgrade.    HPI HPI: Pt is a 67 year old female with history significant for memory loss, PTSD, COPD, schizophrenia, depression, diastolic heart failure, HTN, HLD, DMT2, CKD stage IV, asthma, chronic anemia, chronic pain, GERD, barrett's esophagus, hypothyroid, OSA on CPAP who presented to [facility] ED from SNF for AMS. ETT 12/12-12/13. MRI negative for acute infarct. Pt had prior BSE in March 2017 with functional appearing oropharyngeal swallow.       SLP Plan  Continue with current plan of care       Recommendations  Diet recommendations: Dysphagia 3 (mechanical soft);Nectar-thick liquid Liquids provided via: Cup;No straw Medication Administration: Whole meds with puree Supervision: Patient able to self feed Compensations: Minimize environmental distractions;Slow rate;Small sips/bites Postural Changes and/or Swallow Maneuvers: Seated upright 90 degrees;Upright 30-60 min after meal                Oral Care Recommendations: Oral care BID Follow up Recommendations: Skilled Nursing facility SLP Visit Diagnosis: Dysphagia, unspecified (R13.10) Plan: Continue with  current plan of care       [place], Shenelle Klas Willis 07/29/2018, 10:07 AM   Orbie Pyo Colvin Caroli.Ed Risk analyst [phone] Office [phone]

## 2018-07-29 NOTE — Progress Notes (Signed)
NAMEJaidence Cross, MRN:  [id number], DOB:  [birth year], LOS: 2 ADMISSION DATE:  07/27/2018, CONSULTATION DATE:  07/27/2018 REFERRING MD:  Dr. Cheral Marker, CHIEF COMPLAINT:  AMS  Brief History   29 yoF presenting with acute encephalopathy and weakness.  Intubated for airway protection.  Initial stroke workup negative.  Unclear etiology for encephalopathy at this time.  PCCM to admit.  History of present illness   HPI obtained from is encephalopathic, intubated, and mechanically ventilated.  67 year old female with history significant for schizophrenia, depression, diastolic heart failure, HTN, HLD, DMT2, CKD stage IV, asthma, chronic anemia, chronic pain, GERD, hypothyroid, OSA on CPAP who is a resident at [facility] who presented to [facility] ED for altered mental status.  At baseline, patient is wheelchair bound, verbal, and conversant.  She is dependent on her ADL but able to feed herself.  LSW at 0800 on 12/12 and noted at lunch to be very drowsy and drifting off sleep.  CBG 279.  On arrival to ER, patient initially with GCS of 6 (P1W2H8), aphasic with purposeful movement of RUE but then progressed to completely flaccid.  Intubated for airway protection. There was concern for acute stroke and code stroke activated. NIHSS 27.  CT head negative.  Not a candidate for TPA based on time criteria.  Decision was made to transfer patient to [facility] for MRI/ MRA for concern of brainstem stroke and possible IR.  Labs noted for negative UA, neg troponin, non acute EKG, neg LFTs, sCr 2.33 (which is around baseline), WBC 8.4, Hgb 9.4 (baseline 9), UDS negative, ETOH neg.  CXR with vascular congestion.  She has been afebrile and normotensive.  MRI/ MRA at [facility] with no acute findings. Endovascular treatment ruled out by Neuro/ Neuro IR based on additional history based on guidelines and MRA that did not show LVO.  No apparent infectious etiology.  Neurology following.  Some concern for seizure vs  postictal state vs brainstem stroke although MRI negative.  PCCM to admit.   Past Medical History  Former smoker, schizophrenia, depression, anxiety, PTSD, OHS/ OSA on CPAP, diastolic heart failure, HTN, HLD, DMT2, CKD stage IV, asthma, chronic anemia, chronic pain, GERD, hypothyroid, PE (2010), RA, gout, fibromyalgia  Significant Hospital Events   12/12 Admitted  Consults:  Neurology  Procedures:  12/12 ETT >> 12/13  Significant Diagnostic Tests:  12/12 CTH >> negative; ASPECTS 10  12/12 MRI/MRA >> 1. No acute intracranial abnormality. 2. Findings of chronic microvascular ischemia. 3. Normal intracranial MRA.  Micro Data:   Antimicrobials:   Interim history/subjective:  Extubated successfully Asking to eat  Objective   Blood pressure (!) 145/73, pulse 81, temperature 97.6 F (36.4 C), temperature source Oral, resp. rate 17, height 5\' 3"  (1.6 m), weight 99.4 kg, SpO2 98 %.    Vent Mode: PSV;CPAP FiO2 (%):  [40 %] 40 % Set Rate:  [20 bmp] 20 bmp Vt Set:  [420 mL] 420 mL PEEP:  [5 cmH20] 5 cmH20 Pressure Support:  [5 cmH20-10 cmH20] 5 cmH20 Plateau Pressure:  [17 cmH20] 17 cmH20   Intake/Output Summary (Last 24 hours) at 07/29/2018 0758 Last data filed at 07/28/2018 2000 Gross per 24 hour  Intake 12.24 ml  Output 1150 ml  Net -1137.76 ml   Filed Weights   07/27/18 1507 07/28/18 0451  Weight: 102 kg 99.4 kg   Examination: General: Obese elderly woman, in no distress HEENT: Oropharynx clear.  She does have a large neck with some upper airway noise  and secretions Neuro: Awake, answers questions, interacts, follows commands CV: Regular, no murmur PULM: Decreased at both bases GI: Soft, nondistended, positive bowel sounds Extremities: 1+ lower extremity edema Skin: No rashes  Resolved Hospital Problem list    Assessment & Plan:  Acute encephalopathy  - unclear etiology at this point - ddx broad- concern for brainstem stroke still possibility per Neuro even  though MRA negative vs ?seizure/ postictal states vs NCSE; question sedating medications.  EEG is reassuring - no apparent infectious etiology, UDS neg, TSH was normal as of 04/2018, no significant electrolyte abnormalities - no apparent changes in pysch meds, appreciate pharmacy review.  Patient taking risperidone.  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  We will need to weigh the pros and cons of reinitiating.  Acute respiratory insufficiency related to above Hx asthma/ OSA on CPAP/ OHS P:  Push pulmonary hygiene Continue Brovana, Pulmicort.  Restart Symbicort at the time of discharge Order nocturnal auto-set CPAP singulair   HTN, HLD, diastolic HF P:  Lipitor, aspirin on hold currently Hold losartan, carvedilol, spironolactone, torsemide for another day.  Begin to add back over the next several days as blood pressure recovers  DMT2 P:  Sign scale insulin as per protocol  Hypothyroidism P:  Continue Synthroid  Hx anxiety/ depression/ schizophrenia/ PTSD P:  Holding Ativan, trazodone Will need to carefully restart her prazosin, hydroxyzine, Cymbalta Neurontin restarted and tolerated  Careful when time to restart her prazosin, hydroxyzine, Neurontin, Cymbalta.  As above her risperidone is on hold  CKD- stable P:  Follow BMP, urine output  Anemia - stable P:  Follow CBC  Best practice:  Diet: NPO Pain/Anxiety/Delirium protocol (if indicated): as needed VAP protocol (if indicated): yes DVT prophylaxis: heparin sq/ scds GI prophylaxis: pepcid Glucose control: SSI Mobility: BR Code Status: Full Family Communication: No family present 12/13 or 12/14 Disposition: plan transition to progressive care 12/14  Labs   CBC: Recent Labs  Lab 07/27/18 1500 07/27/18 1518 07/28/18 0404 07/29/18 0213   WBC 8.4  --  10.3 7.1  NEUTROABS 6.4  --   --   --   HGB 9.4* 9.2* 8.8* 8.4*  HCT 28.5* 27.0* 26.5* 26.5*  MCV 97.6  --  96.4 97.8  PLT 203  --  123* 338    Basic Metabolic Panel: Recent Labs  Lab 07/27/18 1500 07/27/18 1518 07/28/18 0404 07/29/18 0213  NA 138 138 141 143  K 4.4 4.4 4.1 3.9  CL 105 103 102 110  CO2 25  --  25 23  GLUCOSE 233* 232* 130* 105*  BUN 43* 38* 43* 37*  CREATININE 2.33* 2.40* 2.37* 2.27*  CALCIUM 9.0  --  8.9 8.6*  MG  --   --  2.0 2.2  PHOS  --   --  4.8*  --    GFR: Estimated Creatinine Clearance: 27.4 mL/min (A) (by C-G formula based on SCr of 2.27 mg/dL (H)). Recent Labs  Lab 07/27/18 1500 07/28/18 0404 07/29/18 0213  WBC 8.4 10.3 7.1    Liver Function Tests: Recent Labs  Lab 07/27/18 1500  AST 20  ALT 28  ALKPHOS 89  BILITOT 0.3  PROT 6.8  ALBUMIN 3.2*   No results for input(s): LIPASE, AMYLASE in the last 168 hours. No results for input(s): AMMONIA in the last 168 hours.  ABG  Component Value Date/Time   PHART 7.311 (L) 07/27/2018 1630   PCO2ART 50.5 (H) 07/27/2018 1630   PO2ART 131 (H) 07/27/2018 1630   HCO3 23.8 07/27/2018 1630   TCO2 27 07/27/2018 1518   ACIDBASEDEF 0.7 07/27/2018 1630   O2SAT 98.8 07/27/2018 1630     Coagulation Profile: Recent Labs  Lab 07/27/18 1500  INR 0.91    Cardiac Enzymes: No results for input(s): CKTOTAL, CKMB, CKMBINDEX, TROPONINI in the last 168 hours.  HbA1C: Hgb A1c MFr Bld  Date/Time Value Ref Range Status  01/12/2018 06:24 PM 6.8 (H) 4.8 - 5.6 % Final    Comment:    (NOTE) Pre diabetes:          5.7%-6.4% Diabetes:              >6.4% Glycemic control for   <7.0% adults with diabetes   10/13/2016 03:30 AM 5.6 4.8 - 5.6 % Final    Comment:    (NOTE)         Pre-diabetes: 5.7 - 6.4         Diabetes: >6.4         Glycemic control for adults with diabetes: <7.0     CBG: Recent Labs  Lab 07/28/18 1153 07/28/18 1456 07/28/18 1915 07/28/18 2317  07/29/18 0432  GLUCAP 113* 105* 89 92 99     Critical care time: 34 mins     Baltazar Apo, MD, PhD 07/29/2018, 7:58 AM [facility] [phone] or if no answer [phone]

## 2018-07-29 NOTE — Progress Notes (Signed)
Subjective: The patient is extubated, awake and sitting in a chair eating breakfast.    Objective: Current vital signs: BP (!) 179/84   Pulse 79   Temp 97.6 F (36.4 C) (Oral)   Resp 16   Ht 5\' 3"  (1.6 m)   Wt 99.4 kg   SpO2 100%   BMI 38.82 kg/m  Vital signs in last 24 hours: Temp:  [97.6 F (36.4 C)-99.2 F (37.3 C)] 97.6 F (36.4 C) (12/14 0705) Pulse Rate:  [75-89] 79 (12/14 1100) Resp:  [12-26] 16 (12/14 1100) BP: (127-179)/(60-84) 179/84 (12/14 1100) SpO2:  [94 %-100 %] 100 % (12/14 1100)  Intake/Output from previous day: 12/13 0701 - 12/14 0700 In: 12.2 [I.V.:12.2] Out: 1150 [Urine:1150] Intake/Output this shift: No intake/output data recorded. Nutritional status:  Diet Order            DIET DYS 3 Room service appropriate? Yes; Fluid consistency: Nectar Thick  Diet effective now             HEENT: [place]/AT Lungs: Respirations unlabored Ext: No cyanosis  Neurologic Exam: Ment: Awake and alert. Speech fluent with intact comprehension. Has difficulty with orientation questions.  CN: Fixates and tracks normally. Exotropia noted. Jaw and tongue dyskinesias noted intermittently. No facial droop.  Motor: BUE 4+/5 without asymmetry. LLE 4/5 knee extension. RLE with no movement volitionally - patient states this is chronic due to a "nerve problem" Sensory: No sensation to FT RLE per patient due to a chronic "nerve problem"   Lab Results: Results for orders placed or performed during the hospital encounter of 07/27/18 (from the past 48 hour(s))  Ethanol     Status: None   Collection Time: 07/27/18  3:00 PM  Result Value Ref Range   Alcohol, Ethyl (B) <10 <10 mg/dL    Comment: Performed at [facility], [address], [place], [place] [address]  Protime-INR     Status: None   Collection Time: 07/27/18  3:00 PM  Result Value Ref Range   Prothrombin Time 12.2 11.4 - 15.2 seconds   INR 0.91     Comment: Performed at [facility], [address] [place],  [place] [address]  APTT     Status: None   Collection Time: 07/27/18  3:00 PM  Result Value Ref Range   aPTT 28 24 - 36 seconds    Comment: Performed at [facility], [address], [place], [place] [address]  CBC     Status: Abnormal   Collection Time: 07/27/18  3:00 PM  Result Value Ref Range   WBC 8.4 4.0 - 10.5 K/uL   RBC 2.92 (L) 3.87 - 5.11 MIL/uL   Hemoglobin 9.4 (L) 12.0 - 15.0 g/dL   HCT 28.5 (L) 36.0 - 46.0 %   MCV 97.6 80.0 - 100.0 fL   MCH 32.2 26.0 - 34.0 pg   MCHC 33.0 30.0 - 36.0 g/dL   RDW 13.7 11.5 - 15.5 %   Platelets 203 150 - 400 K/uL   nRBC 0.0 0.0 - 0.2 %    Comment: Performed at [facility] [address], [place], [place] [address]  Differential     Status: None   Collection Time: 07/27/18  3:00 PM  Result Value Ref Range   Neutrophils Relative % 76 %   Neutro Abs 6.4 1.7 - 7.7 K/uL   Lymphocytes Relative 11 %   Lymphs Abs 0.9 0.7 - 4.0 K/uL   Monocytes Relative 8 %   Monocytes Absolute 0.7 0.1 -  1.0 K/uL   Eosinophils Relative 4 %   Eosinophils Absolute 0.3 0.0 - 0.5 K/uL   Basophils Relative 0 %   Basophils Absolute 0.0 0.0 - 0.1 K/uL   Immature Granulocytes 1 %   Abs Immature Granulocytes 0.04 0.00 - 0.07 K/uL    Comment: Performed at [facility], [address], [place], [place] [address]  Comprehensive metabolic panel     Status: Abnormal   Collection Time: 07/27/18  3:00 PM  Result Value Ref Range   Sodium 138 135 - 145 mmol/L   Potassium 4.4 3.5 - 5.1 mmol/L   Chloride 105 98 - 111 mmol/L   CO2 25 22 - 32 mmol/L   Glucose, Bld 233 (H) 70 - 99 mg/dL   BUN 43 (H) 8 - 23 mg/dL   Creatinine, Ser 2.33 (H) 0.44 - 1.00 mg/dL   Calcium 9.0 8.9 - 10.3 mg/dL   Total Protein 6.8 6.5 - 8.1 g/dL   Albumin 3.2 (L) 3.5 - 5.0 g/dL   AST 20 15 - 41 U/L   ALT 28 0 - 44 U/L   Alkaline Phosphatase 89 38 - 126 U/L   Total Bilirubin 0.3 0.3 - 1.2 mg/dL   GFR calc non Af Amer 21 (L) >60 mL/min   GFR calc Af Amer 24 (L) >60 mL/min   Anion gap 8 5 - 15     Comment: Performed at [facility], [address], [place], [place] [address]  CBG monitoring, ED     Status: Abnormal   Collection Time: 07/27/18  3:10 PM  Result Value Ref Range   Glucose-Capillary 260 (H) 70 - 99 mg/dL  I-stat troponin, ED     Status: None   Collection Time: 07/27/18  3:16 PM  Result Value Ref Range   Troponin i, poc 0.00 0.00 - 0.08 ng/mL   Comment 3            Comment: Due to the release kinetics of cTnI, a negative result within the first hours of the onset of symptoms does not rule out myocardial infarction with certainty. If myocardial infarction is still suspected, repeat the test at appropriate intervals.   I-Stat Chem 8, ED     Status: Abnormal   Collection Time: 07/27/18  3:18 PM  Result Value Ref Range   Sodium 138 135 - 145 mmol/L   Potassium 4.4 3.5 - 5.1 mmol/L   Chloride 103 98 - 111 mmol/L   BUN 38 (H) 8 - 23 mg/dL   Creatinine, Ser 2.40 (H) 0.44 - 1.00 mg/dL   Glucose, Bld 232 (H) 70 - 99 mg/dL   Calcium, Ion 1.24 1.15 - 1.40 mmol/L   TCO2 27 22 - 32 mmol/L   Hemoglobin 9.2 (L) 12.0 - 15.0 g/dL   HCT 27.0 (L) 36.0 - 46.0 %  Urine rapid drug screen (hosp performed)     Status: None   Collection Time: 07/27/18  4:30 PM  Result Value Ref Range   Opiates NONE DETECTED NONE DETECTED   Cocaine NONE DETECTED NONE DETECTED   Benzodiazepines NONE DETECTED NONE DETECTED   Amphetamines NONE DETECTED NONE DETECTED   Tetrahydrocannabinol NONE DETECTED NONE DETECTED   Barbiturates NONE DETECTED NONE DETECTED    Comment: (NOTE) DRUG SCREEN FOR MEDICAL PURPOSES ONLY.  IF CONFIRMATION IS NEEDED FOR ANY PURPOSE, NOTIFY LAB WITHIN 5 DAYS. LOWEST DETECTABLE LIMITS FOR URINE DRUG SCREEN Drug Class  Cutoff (ng/mL) Amphetamine and metabolites    1000 Barbiturate and metabolites    200 Benzodiazepine                 299 Tricyclics and metabolites     300 Opiates and metabolites        300 Cocaine and metabolites        300 THC                             50 Performed at [facility], [address] [place], [place] [address]   Urinalysis, Routine w reflex microscopic     Status: Abnormal   Collection Time: 07/27/18  4:30 PM  Result Value Ref Range   Color, Urine STRAW (A) YELLOW   APPearance CLEAR CLEAR   Specific Gravity, Urine 1.006 1.005 - 1.030   pH 6.0 5.0 - 8.0   Glucose, UA 50 (A) NEGATIVE mg/dL   Hgb urine dipstick NEGATIVE NEGATIVE   Bilirubin Urine NEGATIVE NEGATIVE   Ketones, ur NEGATIVE NEGATIVE mg/dL   Protein, ur 100 (A) NEGATIVE mg/dL   Nitrite NEGATIVE NEGATIVE   Leukocytes, UA NEGATIVE NEGATIVE   RBC / HPF 0-5 0 - 5 RBC/hpf   WBC, UA 0-5 0 - 5 WBC/hpf   Bacteria, UA RARE (A) NONE SEEN    Comment: Performed at [facility], [address] [place], [place] [address]  Blood gas, arterial ([facility] & [facility] ONLY)     Status: Abnormal   Collection Time: 07/27/18  4:30 PM  Result Value Ref Range   FIO2 50.00    Delivery systems VENTILATOR    Mode PRESSURE REGULATED VOLUME CONTROL    VT 420 mL   LHR 18 resp/min   Peep/cpap 5.0 cm H20   pH, Arterial 7.311 (L) 7.350 - 7.450   pCO2 arterial 50.5 (H) 32.0 - 48.0 mmHg   pO2, Arterial 131 (H) 83.0 - 108.0 mmHg   Bicarbonate 23.8 20.0 - 28.0 mmol/L   Acid-base deficit 0.7 0.0 - 2.0 mmol/L   O2 Saturation 98.8 %   Patient temperature 37.0    Collection site RIGHT RADIAL    Drawn by [id number]    Sample type ARTERIAL DRAW    Allens test (pass/fail) PASS PASS    Comment: Performed at [facility], [address], [place], [place] [address]  CBG monitoring, ED     Status: Abnormal   Collection Time: 07/27/18  9:15 PM  Result Value Ref Range   Glucose-Capillary 208 (H) 70 - 99 mg/dL  Glucose, capillary     Status: Abnormal   Collection Time: 07/27/18 11:21 PM  Result Value Ref Range   Glucose-Capillary 172 (H) 70 - 99 mg/dL  MRSA PCR Screening     Status: None   Collection Time: 07/27/18 11:56 PM  Result Value Ref Range   MRSA by PCR NEGATIVE NEGATIVE     Comment:        The GeneXpert MRSA Assay (FDA approved for NASAL specimens only), is one component of a comprehensive MRSA colonization surveillance program. It is not intended to diagnose MRSA infection nor to guide or monitor treatment for MRSA infections. Performed at [facility], [place] [address] [place], [place] [address]   Glucose, capillary     Status: Abnormal   Collection Time: 07/28/18  3:12 AM  Result Value Ref Range   Glucose-Capillary 158 (H) 70 - 99 mg/dL   Comment 1 Notify  RN   CBC     Status: Abnormal   Collection Time: 07/28/18  4:04 AM  Result Value Ref Range   WBC 10.3 4.0 - 10.5 K/uL   RBC 2.75 (L) 3.87 - 5.11 MIL/uL   Hemoglobin 8.8 (L) 12.0 - 15.0 g/dL   HCT 26.5 (L) 36.0 - 46.0 %   MCV 96.4 80.0 - 100.0 fL   MCH 32.0 26.0 - 34.0 pg   MCHC 33.2 30.0 - 36.0 g/dL   RDW 13.6 11.5 - 15.5 %   Platelets 123 (L) 150 - 400 K/uL   nRBC 0.0 0.0 - 0.2 %    Comment: Performed at [facility], [place] [address], [place], [place] [address]  Basic metabolic panel     Status: Abnormal   Collection Time: 07/28/18  4:04 AM  Result Value Ref Range   Sodium 141 135 - 145 mmol/L   Potassium 4.1 3.5 - 5.1 mmol/L   Chloride 102 98 - 111 mmol/L   CO2 25 22 - 32 mmol/L   Glucose, Bld 130 (H) 70 - 99 mg/dL   BUN 43 (H) 8 - 23 mg/dL   Creatinine, Ser 2.37 (H) 0.44 - 1.00 mg/dL   Calcium 8.9 8.9 - 10.3 mg/dL   GFR calc non Af Amer 21 (L) >60 mL/min   GFR calc Af Amer 24 (L) >60 mL/min   Anion gap 14 5 - 15    Comment: Performed at [facility], [place] [address], [place], [place] [address]  Magnesium     Status: None   Collection Time: 07/28/18  4:04 AM  Result Value Ref Range   Magnesium 2.0 1.7 - 2.4 mg/dL    Comment: Performed at [facility] [address], [place], [place] [address]  Phosphorus     Status: Abnormal   Collection Time: 07/28/18  4:04 AM  Result Value Ref Range   Phosphorus 4.8 (H) 2.5 - 4.6 mg/dL    Comment: Performed at [facility] [address] [place], [place] [address]  TSH     Status: None   Collection Time: 07/28/18  4:04 AM  Result Value Ref Range   TSH 2.047 0.350 - 4.500 uIU/mL    Comment: Performed by a 3rd Generation assay with a functional sensitivity of <=0.01 uIU/mL. Performed at [facility], [place] [address] [place], [place] [address]   Triglycerides     Status: Abnormal   Collection Time: 07/28/18  4:04 AM  Result Value Ref Range   Triglycerides 237 (H) <150 mg/dL    Comment: Performed at [facility] [address], [place], [place] [address]  Glucose, capillary     Status: Abnormal   Collection Time: 07/28/18  7:19 AM  Result Value Ref Range   Glucose-Capillary 113 (H) 70 - 99 mg/dL  Glucose, capillary     Status: Abnormal   Collection Time: 07/28/18 11:53 AM  Result Value Ref Range   Glucose-Capillary 113 (H) 70 - 99 mg/dL  Glucose, capillary     Status: Abnormal   Collection Time: 07/28/18  2:56 PM  Result Value Ref Range   Glucose-Capillary 105 (H) 70 - 99 mg/dL  Glucose, capillary     Status: None   Collection Time: 07/28/18  7:15 PM  Result Value Ref Range   Glucose-Capillary 89 70 - 99 mg/dL  Glucose, capillary     Status: None   Collection Time: 07/28/18 11:17 PM  Result Value Ref Range   Glucose-Capillary 92 70 - 99 mg/dL  Basic metabolic panel     Status: Abnormal   Collection Time: 07/29/18  2:13 AM  Result Value Ref Range   Sodium 143 135 - 145 mmol/L   Potassium 3.9 3.5 - 5.1 mmol/L   Chloride 110 98 - 111 mmol/L   CO2 23 22 - 32 mmol/L   Glucose, Bld 105 (H) 70 - 99 mg/dL   BUN 37 (H) 8 - 23 mg/dL   Creatinine, Ser 2.27 (H) 0.44 - 1.00 mg/dL   Calcium 8.6 (L) 8.9 - 10.3 mg/dL   GFR calc non Af Amer 22 (L) >60 mL/min   GFR calc Af Amer 25 (L) >60 mL/min   Anion gap 10 5 - 15    Comment: Performed at [facility] [address], [place], [place] [address]  Magnesium     Status: None   Collection Time: 07/29/18  2:13 AM  Result  Value Ref Range   Magnesium 2.2 1.7 - 2.4 mg/dL    Comment: Performed at [facility] [address], [place], [place] [address]  CBC     Status: Abnormal   Collection Time: 07/29/18  2:13 AM  Result Value Ref Range   WBC 7.1 4.0 - 10.5 K/uL   RBC 2.71 (L) 3.87 - 5.11 MIL/uL   Hemoglobin 8.4 (L) 12.0 - 15.0 g/dL   HCT 26.5 (L) 36.0 - 46.0 %   MCV 97.8 80.0 - 100.0 fL   MCH 31.0 26.0 - 34.0 pg   MCHC 31.7 30.0 - 36.0 g/dL   RDW 13.9 11.5 - 15.5 %   Platelets 182 150 - 400 K/uL   nRBC 0.0 0.0 - 0.2 %    Comment: Performed at [facility], [place] [address], [place], [place] [address]  Glucose, capillary     Status: None   Collection Time: 07/29/18  4:32 AM  Result Value Ref Range   Glucose-Capillary 99 70 - 99 mg/dL  Glucose, capillary     Status: None   Collection Time: 07/29/18  7:04 AM  Result Value Ref Range   Glucose-Capillary 92 70 - 99 mg/dL    Recent Results (from the past 240 hour(s))  MRSA PCR Screening     Status: None   Collection Time: 07/27/18 11:56 PM  Result Value Ref Range Status   MRSA by PCR NEGATIVE NEGATIVE Final    Comment:        The GeneXpert MRSA Assay (FDA approved for NASAL specimens only), is one component of a comprehensive MRSA colonization surveillance program. It is not intended to diagnose MRSA infection nor to guide or monitor treatment for MRSA infections. Performed at [facility], [place] [address], [place], [place] [address]     Lipid Panel Recent Labs    07/28/18 0404  TRIG 237*    Studies/Results: Mr Virgel Paling MV Contrast  Result Date: 07/27/2018 CLINICAL DATA:  Acute onset somnolence.  Altered mental status. EXAM: MRI HEAD WITHOUT CONTRAST MRA HEAD WITHOUT CONTRAST TECHNIQUE: Multiplanar, multiecho pulse sequences of the brain and surrounding structures were obtained without intravenous contrast. Angiographic images of the head were obtained using MRA technique without contrast. COMPARISON:  Head CT 07/27/2018  Brain MRI 04/19/2018 FINDINGS: An abbreviated protocol was performed. Sagittal T1, axial T2, axial T1 and coronal T2-weighted imaging was not obtained. MRI HEAD FINDINGS BRAIN: There is no acute infarct, acute hemorrhage or mass effect. The  midline structures are normal. There are no old infarcts. Multifocal white matter hyperintensity, most commonly due to chronic ischemic microangiopathy. Mild generalized atrophy. SKULL AND UPPER CERVICAL SPINE: The visualized skull base, calvarium, upper cervical spine and extracranial soft tissues are normal. SINUSES/ORBITS: No fluid levels or advanced mucosal thickening. No mastoid or middle ear effusion. The orbits are normal. MRA HEAD FINDINGS Intracranial internal carotid arteries: Normal. Anterior cerebral arteries: Normal. Middle cerebral arteries: Normal. Posterior communicating arteries: Present on the right only. Posterior cerebral arteries: Normal. Basilar artery: Normal. Vertebral arteries: Left dominant. Normal. Superior cerebellar arteries: Normal. Inferior cerebellar arteries: Normal. IMPRESSION: 1. No acute intracranial abnormality. 2. Findings of chronic microvascular ischemia. 3. Normal intracranial MRA. Electronically Signed   By: Ulyses Jarred M.D.   On: 07/27/2018 19:56   Mr Brain Wo Contrast  Result Date: 07/27/2018 CLINICAL DATA:  Acute onset somnolence.  Altered mental status. EXAM: MRI HEAD WITHOUT CONTRAST MRA HEAD WITHOUT CONTRAST TECHNIQUE: Multiplanar, multiecho pulse sequences of the brain and surrounding structures were obtained without intravenous contrast. Angiographic images of the head were obtained using MRA technique without contrast. COMPARISON:  Head CT 07/27/2018 Brain MRI 04/19/2018 FINDINGS: An abbreviated protocol was performed. Sagittal T1, axial T2, axial T1 and coronal T2-weighted imaging was not obtained. MRI HEAD FINDINGS BRAIN: There is no acute infarct, acute hemorrhage or mass effect. The midline structures are normal. There  are no old infarcts. Multifocal white matter hyperintensity, most commonly due to chronic ischemic microangiopathy. Mild generalized atrophy. SKULL AND UPPER CERVICAL SPINE: The visualized skull base, calvarium, upper cervical spine and extracranial soft tissues are normal. SINUSES/ORBITS: No fluid levels or advanced mucosal thickening. No mastoid or middle ear effusion. The orbits are normal. MRA HEAD FINDINGS Intracranial internal carotid arteries: Normal. Anterior cerebral arteries: Normal. Middle cerebral arteries: Normal. Posterior communicating arteries: Present on the right only. Posterior cerebral arteries: Normal. Basilar artery: Normal. Vertebral arteries: Left dominant. Normal. Superior cerebellar arteries: Normal. Inferior cerebellar arteries: Normal. IMPRESSION: 1. No acute intracranial abnormality. 2. Findings of chronic microvascular ischemia. 3. Normal intracranial MRA. Electronically Signed   By: Ulyses Jarred M.D.   On: 07/27/2018 19:56   Dg Chest Port 1 View  Result Date: 07/28/2018 CLINICAL DATA:  Hypoxia EXAM: PORTABLE CHEST 1 VIEW COMPARISON:  July 27, 2018 FINDINGS: Endotracheal tube tip is 2.2 cm above the carina. Nasogastric tube tip and side port are below the diaphragm. No pneumothorax. There is atelectatic change in the lung bases. The lungs elsewhere are clear. Heart is mildly enlarged with pulmonary vascularity normal. No adenopathy. No appreciable bone lesions. IMPRESSION: Tube positions as described without pneumothorax. Bibasilar atelectasis. No frank airspace consolidation. Stable cardiac prominence. Electronically Signed   By: Lowella Grip III M.D.   On: 07/28/2018 07:22   Dg Chest Portable 1 View  Result Date: 07/27/2018 CLINICAL DATA:  Tube placement. EXAM: PORTABLE CHEST 1 VIEW COMPARISON:  07/14/2018. FINDINGS: Endotracheal tube tip noted approximately 7 mm above the lower portion of the carina. Proximal repositioning of approximately 2 cm should be  considered. NG tube noted with tip below left hemidiaphragm. Cardiomegaly with pulmonary venous congestion bilateral interstitial prominence. Findings consistent with CHF. No pneumothorax. IMPRESSION: 1. Endotracheal tube noted with its tip approximately 7 mm above the lower portion of the carina. Proximal repositioning of approximately 2 cm should be considered. NG tube noted with tip below left hemidiaphragm. 2. Cardiomegaly with pulmonary venous congestion bilateral interstitial prominence consistent with CHF. Electronically Signed   By: Marcello Moores  Register  On: 07/27/2018 16:23   Ct Head Code Stroke Wo Contrast  Result Date: 07/27/2018 CLINICAL DATA:  Code stroke.  Altered mental status EXAM: CT HEAD WITHOUT CONTRAST TECHNIQUE: Contiguous axial images were obtained from the base of the skull through the vertex without intravenous contrast. COMPARISON:  Head CT 04/18/2018 FINDINGS: Brain: There is no mass, hemorrhage or extra-axial collection. The size and configuration of the ventricles and extra-axial CSF spaces are normal. There is a prominent perivascular space of the left lentiform nucleus, unchanged. Mild white matter hypoattenuation compatible with chronic small vessel disease. Vascular: No abnormal hyperdensity of the major intracranial arteries or dural venous sinuses. No intracranial atherosclerosis. Skull: The visualized skull base, calvarium and extracranial soft tissues are normal. Sinuses/Orbits: No fluid levels or advanced mucosal thickening of the visualized paranasal sinuses. No mastoid or middle ear effusion. The orbits are normal. ASPECTS [facility] Stroke Program Early CT Score) - Ganglionic level infarction (caudate, lentiform nuclei, internal capsule, insula, M1-M3 cortex): 7 - Supraganglionic infarction (M4-M6 cortex): 3 Total score (0-10 with 10 being normal): 10 IMPRESSION: 1. No hemorrhage or other acute abnormality. 2. ASPECTS is 10. These results were called by telephone at the time  of interpretation on 07/27/2018 at 3:07 pm to Dr. Nanda Quinton , who verbally acknowledged these results. Electronically Signed   By: Ulyses Jarred M.D.   On: 07/27/2018 15:08    Medications:  Scheduled: . arformoterol  15 mcg Nebulization BID  . aspirin  81 mg Oral Daily  . atorvastatin  40 mg Per Tube q1800  . budesonide (PULMICORT) nebulizer solution  0.5 mg Nebulization BID  . DULoxetine  60 mg Oral Daily  . famotidine  20 mg Oral Daily  . gabapentin  300 mg Oral BID  . gabapentin  600 mg Oral QHS  . heparin  5,000 Units Subcutaneous Q8H  . insulin aspart  0-20 Units Subcutaneous TID WC  . insulin aspart  0-5 Units Subcutaneous QHS  . [START ON 07/30/2018] levothyroxine  25 mcg Oral Q0600  . montelukast  10 mg Oral QHS    Assessment: 67 year old female initially presenting as a possible brainstem stroke. MRI was negative and she is now awake and alert without brainstem findings on exam. 1. Unclear what the etiology for her initial presentation was. On discussion with two of my physician colleagues, there seems to be a consensus that medication overdose is high on the DDx. Also possible would be a severe catatonic state given her history of schizophrenia.  2. RLE weakness. The patient states that this is chronic from a "nerve problem" but there is no diagnosis listed for this in Epic. There is no ACA infarct on her MRI to account for this finding. DDx includes right lumbosacral plexus injury in the past or right sided nerve root compressions.  3. Oral dyskinesias. Most likely due to chronic effects of neuroleptic use given that she has a diagnosis of schizophrenia.   Recommendations: 1. PT/OT 2. Follow up with her PCP regarding RLE weakness, which the patient states is chronic.  3. Neurology will sign off. Please call if there are additional questions.    LOS: 2 days   @Electronically  signed: Dr. Kerney Elbe 07/29/2018  11:00 AM

## 2018-07-30 LAB — BASIC METABOLIC PANEL
Anion gap: 11 (ref 5–15)
BUN: 44 mg/dL — ABNORMAL HIGH (ref 8–23)
CO2: 24 mmol/L (ref 22–32)
Calcium: 8.6 mg/dL — ABNORMAL LOW (ref 8.9–10.3)
Chloride: 107 mmol/L (ref 98–111)
Creatinine, Ser: 2.5 mg/dL — ABNORMAL HIGH (ref 0.44–1.00)
GFR calc Af Amer: 22 mL/min — ABNORMAL LOW (ref >60)
GFR calc non Af Amer: 19 mL/min — ABNORMAL LOW (ref >60)
Glucose, Bld: 252 mg/dL — ABNORMAL HIGH (ref 70–99)
Potassium: 4.4 mmol/L (ref 3.5–5.1)
Sodium: 142 mmol/L (ref 135–145)

## 2018-07-30 LAB — CBC
HCT: 26 % — ABNORMAL LOW (ref 36.0–46.0)
Hemoglobin: 8.2 g/dL — ABNORMAL LOW (ref 12.0–15.0)
MCH: 31.2 pg (ref 26.0–34.0)
MCHC: 31.5 g/dL (ref 30.0–36.0)
MCV: 98.9 fL (ref 80.0–100.0)
Platelets: 165 10*3/uL (ref 150–400)
RBC: 2.63 MIL/uL — ABNORMAL LOW (ref 3.87–5.11)
RDW: 13.4 % (ref 11.5–15.5)
WBC: 7.7 10*3/uL (ref 4.0–10.5)
nRBC: 0 % (ref 0.0–0.2)

## 2018-07-30 LAB — GLUCOSE, CAPILLARY
Glucose-Capillary: 201 mg/dL — ABNORMAL HIGH (ref 70–99)
Glucose-Capillary: 269 mg/dL — ABNORMAL HIGH (ref 70–99)

## 2018-07-30 MED ORDER — INSULIN GLARGINE 100 UNIT/ML ~~LOC~~ SOLN
10.0000 [IU] | Freq: Every day | SUBCUTANEOUS | Status: DC
  Administered 2018-07-30 – 2018-07-31 (×2): 10 [IU] via SUBCUTANEOUS
  Filled 2018-07-30 (×2): qty 0.1

## 2018-07-30 MED ORDER — CARVEDILOL 25 MG PO TABS
25.0000 mg | ORAL_TABLET | Freq: Two times a day (BID) | ORAL | Status: DC
  Administered 2018-07-30 – 2018-08-01 (×4): 25 mg via ORAL
  Filled 2018-07-30 (×2): qty 1
  Filled 2018-07-30: qty 4
  Filled 2018-07-30: qty 1

## 2018-07-30 MED ORDER — TORSEMIDE 20 MG PO TABS
40.0000 mg | ORAL_TABLET | Freq: Every day | ORAL | Status: DC
  Administered 2018-07-30 – 2018-08-01 (×3): 40 mg via ORAL
  Filled 2018-07-30 (×3): qty 2

## 2018-07-30 NOTE — Evaluation (Signed)
Occupational Therapy Evaluation Patient Details Name: [name] MRN: [id number] DOB: [birth year] Today's Date: 07/30/2018    History of Present Illness 43 yoF presenting with acute encephalopathy and weakness.  Intubated 12/12-12/13. Initial stroke workup negative.  Unclear etiology for encephalopathy at this time. PMH includes but not limited to: COPD, CKD, CHF, HLD, GERD, GOUT, HTN, PTSD, schizophrenia, DM2, memory loss, hypothyroid.   Clinical Impression   Pt admitted with above. She demonstrates the below listed deficits and will benefit from continued OT to maximize safety and independence with BADLs.  Pt presents to OT with generalized weakness, decreased balance, decreased activity tolerance. She requires set up to max A for ADLs and min A for functional transfers.  She reports she resides in ALF, is w/c dependent, but self propels, and transfers mod I.  She requires assist for bathing and LB dressing.   IF ALF able to meet pt's current level of functioning, feel it would be best for her to return to ALF with follow up [place].  However, if they are unable to provide current level of care, she will need SNF.        Follow Up Recommendations  SNF;Home health OT;Supervision/Assistance - 24 hour    Equipment Recommendations  None recommended by OT    Recommendations for Other Services       Precautions / Restrictions Precautions Precautions: Fall      Mobility Bed Mobility               General bed mobility comments: Pt sitting up in chair   Transfers Overall transfer level: Needs assistance Equipment used: 1 person hand held assist Transfers: Sit to/from Stand;Stand Pivot Transfers Sit to Stand: Min assist Stand pivot transfers: Min assist       General transfer comment: assist for balance     Balance Overall balance assessment: Needs assistance Sitting-balance support: Feet supported Sitting balance-Leahy Scale: Fair     Standing balance support:  Single extremity supported Standing balance-Leahy Scale: Poor                             ADL either performed or assessed with clinical judgement   ADL Overall ADL's : Needs assistance/impaired Eating/Feeding: Set up   Grooming: Wash/dry hands;Wash/dry face;Oral care;Brushing hair;Set up;Sitting   Upper Body Bathing: Minimal assistance;Sitting   Lower Body Bathing: Maximal assistance;Sit to/from stand   Upper Body Dressing : Minimal assistance;Sitting   Lower Body Dressing: Total assistance;Sit to/from stand   Toilet Transfer: Minimal assistance;Stand-pivot;BSC   Toileting- Clothing Manipulation and Hygiene: Maximal assistance;Sit to/from stand       Functional mobility during ADLs: Minimal assistance General ADL Comments: DOE 3/4 on 2L supplemental 02      Vision Baseline Vision/History: Wears glasses Wears Glasses: At all times Patient Visual Report: No change from baseline       Perception     Praxis      Pertinent Vitals/Pain Pain Assessment: No/denies pain     Hand Dominance Right   Extremity/Trunk Assessment Upper Extremity Assessment Upper Extremity Assessment: Generalized weakness   Lower Extremity Assessment Lower Extremity Assessment: Defer to PT evaluation       Communication Communication Communication: No difficulties   Cognition Arousal/Alertness: Awake/alert Behavior During Therapy: WFL for tasks assessed/performed;Flat affect Overall Cognitive Status: No family/caregiver present to determine baseline cognitive functioning  General Comments: Pt follows commands and converses appropriately.  She demonstrates slow processing.  unsure of her baseline    General Comments  worked on standing balance and functional reach in standing with min A     Exercises     Shoulder Instructions      Home Living Family/patient expects to be discharged to:: Assisted living                                  Additional Comments: Pt reports she lives in ALF, and has been at her current ALF for ~ 3 mos.        Prior Functioning/Environment Level of Independence: Needs assistance  Gait / Transfers Assistance Needed: Pt reports she transfers to w/c, but does not walk and self propels w/c  ADL's / Homemaking Assistance Needed: Pt reports assistance for showers, as well as assist to don socks, shoes, and for toileting              OT Problem List: Decreased strength;Decreased activity tolerance;Impaired balance (sitting and/or standing);Decreased cognition;Obesity;Cardiopulmonary status limiting activity      OT Treatment/Interventions: Self-care/ADL training;Therapeutic exercise;Energy conservation;DME and/or AE instruction;Therapeutic activities;Patient/family education;Balance training    OT Goals(Current goals can be found in the care plan section) Acute Rehab OT Goals Patient Stated Goal: to go back to ALF  OT Goal Formulation: With patient Time For Goal Achievement: 08/13/18 Potential to Achieve Goals: Good ADL Goals Pt Will Perform Lower Body Dressing: with mod assist;sit to/from stand Pt Will Transfer to Toilet: with supervision;stand pivot transfer;bedside commode;grab bars Pt Will Perform Toileting - Clothing Manipulation and hygiene: with mod assist;sit to/from stand  OT Frequency: Min 2X/week   Barriers to D/C: Decreased caregiver support          Co-evaluation              AM-PAC OT "6 Clicks" Daily Activity     Outcome Measure Help from another person eating meals?: A Little Help from another person taking care of personal grooming?: A Little Help from another person toileting, which includes using toliet, bedpan, or urinal?: A Lot Help from another person bathing (including washing, rinsing, drying)?: A Lot Help from another person to put on and taking off regular upper body clothing?: A Little Help from another person to put on and  taking off regular lower body clothing?: Total 6 Click Score: 14   End of Session Equipment Utilized During Treatment: Gait belt;Oxygen Nurse Communication: Mobility status  Activity Tolerance: Patient limited by fatigue Patient left: in chair;with call bell/phone within reach  OT Visit Diagnosis: Unsteadiness on feet (R26.81)                Time: 4680-3212 OT Time Calculation (min): 29 min Charges:  OT General Charges $OT Visit: 1 Visit OT Evaluation $OT Eval Moderate Complexity: 1 Mod OT Treatments $Self Care/Home Management : 8-22 mins  Lucille Passy, OTR/L [place] Pager [phone] Office [phone]   Lucille Passy M 07/30/2018, 2:09 PM

## 2018-07-30 NOTE — Progress Notes (Signed)
Physical Therapy Treatment Patient Details Name: [name] MRN: [id number] DOB: Dec 25, 1950 Today's Date: 07/30/2018    History of Present Illness 66 yoF presenting with acute encephalopathy and weakness.  Intubated 12/12-12/13. Initial stroke workup negative.  Unclear etiology for encephalopathy at this time. PMH includes but not limited to: COPD, CKD, CHF, HLD, GERD, GOUT, HTN, PTSD, schizophrenia, DM2, memory loss, hypothyroid.    PT Comments    Patient progressing well with therapy, improving cognition- still with flat affect and slow processing- now able to progress to ambulation. Pt states she does not walk and uses w/c at home, however was able to walk 40' with +1 assist today, mostly contact guard. VSS on RA, discussed with RN. Cont to rec SNF at this time.       Follow Up Recommendations  SNF     Equipment Recommendations  None recommended by PT    Recommendations for Other Services OT consult     Precautions / Restrictions Precautions Precautions: Fall Restrictions Weight Bearing Restrictions: No    Mobility  Bed Mobility               General bed mobility comments: Pt sitting up in chair   Transfers Overall transfer level: Needs assistance Equipment used: 1 person hand held assist Transfers: Sit to/from Stand;Stand Pivot Transfers Sit to Stand: Min assist Stand pivot transfers: Min assist       General transfer comment: assist for balance   Ambulation/Gait Ambulation/Gait assistance: Min guard Gait Distance (Feet): 40 Feet Assistive device: Rolling walker (2 wheeled) Gait Pattern/deviations: Step-to pattern;Step-through pattern Gait velocity: decreased   General Gait Details: Pt with slow and mild unsteady gait, utilizing RW. VSS on RA during visit. no overt LOB.    Stairs             Wheelchair Mobility    Modified Rankin (Stroke Patients Only)       Balance Overall balance assessment: Needs assistance Sitting-balance  support: Feet supported Sitting balance-Leahy Scale: Fair     Standing balance support: Single extremity supported Standing balance-Leahy Scale: Poor                              Cognition Arousal/Alertness: Awake/alert Behavior During Therapy: WFL for tasks assessed/performed;Flat affect Overall Cognitive Status: No family/caregiver present to determine baseline cognitive functioning                                 General Comments: Pt follows commands and converses appropriately.  She demonstrates slow processing.  unsure of her baseline       Exercises      General Comments General comments (skin integrity, edema, etc.): worked on standing balance and functional reach in standing with min A       Pertinent Vitals/Pain Pain Assessment: No/denies pain    Home Living Family/patient expects to be discharged to:: Assisted living               Additional Comments: Pt reports she lives in ALF, and has been at her current ALF for ~ 3 mos.      Prior Function Level of Independence: Needs assistance  Gait / Transfers Assistance Needed: Pt reports she transfers to w/c, but does not walk and self propels w/c  ADL's / Homemaking Assistance Needed: Pt reports assistance for showers, as well as assist to don socks, shoes, and  for toileting       PT Goals (current goals can now be found in the care plan section) Acute Rehab PT Goals Patient Stated Goal: to go back to ALF  PT Goal Formulation: With patient Time For Goal Achievement: 08/12/18 Potential to Achieve Goals: Fair Progress towards PT goals: Progressing toward goals    Frequency    Min 2X/week      PT Plan Current plan remains appropriate    Co-evaluation              AM-PAC PT "6 Clicks" Mobility   Outcome Measure  Help needed turning from your back to your side while in a flat bed without using bedrails?: A Little Help needed moving from lying on your back to sitting on  the side of a flat bed without using bedrails?: A Little Help needed moving to and from a bed to a chair (including a wheelchair)?: A Little Help needed standing up from a chair using your arms (e.g., wheelchair or bedside chair)?: A Lot Help needed to walk in hospital room?: A Lot Help needed climbing 3-5 steps with a railing? : Total 6 Click Score: 14    End of Session Equipment Utilized During Treatment: Gait belt;Oxygen Activity Tolerance: Patient limited by fatigue Patient left: in chair;with call bell/phone within reach;with chair alarm set Nurse Communication: Mobility status PT Visit Diagnosis: Unsteadiness on feet (R26.81)     Time: 7048-8891 PT Time Calculation (min) (ACUTE ONLY): 28 min  Charges:  $Gait Training: 8-22 mins $Therapeutic Activity: 8-22 mins                     Reinaldo Berber, PT, DPT Acute Rehabilitation Services Pager: [phone] Office: [place] 07/30/2018, 2:35 PM

## 2018-07-30 NOTE — Progress Notes (Signed)
PROGRESS NOTE        PATIENT DETAILS Name: [name] Age: 67 y.o. Sex: female Date of Birth: 06-Feb-1951 Admit Date: 07/27/2018 Admitting Physician Renee Pain, MD HQI:ONGE, Costella Hatcher, MD  Brief Narrative: Patient is a 67 y.o. female resident of a local SNF-with prior history of chronic diastolic heart failure, DM-2, hypertension, CKD stage IV, OSA on CPAP-presented to the ED on 12/12 with acute encephalopathy and weakness, patient was intubated in the emergency room for airway protection and admitted to the ICU.  Upon stability-patient was transferred to the [facility] on 12/15.  Subjective: Awake and alert.  Denies any chest pain or shortness of breath.  Denies any abdominal pain.  Lying comfortably in bed.  Assessment/Plan: Acute encephalopathy: Seems to have resolved-she is completely awake and alert this morning.  Unclear etiology at this time-no evidence of CVA on imaging study, EEG without any activity.  Some concern for polypharmacy/toxic encephalopathy or a catatonic state with a schizophrenia.  Pharmacy reviewed medication list-no apparent change in medications per last note from PCCM.  Concern with Risperdal may have caused encephalopathy on presentation-Risperdal remains on hold.  She has already been restarted on Cymbalta, Neurontin-and seems to be tolerating well.  Given her longstanding history of schizophrenia-have consulted psychiatry to comment on whether she needs a alternate antipsychotic agent as risperidone remains on hold.  Acute hypoxic respiratory failure: Intubated on admission-he successfully extubated on 12/13.  Improved-appears to be on room air this morning.  Hypertension: Blood pressure creeping up-we will restart Coreg, Demadex today.  Continue to hold losartan and Aldactone for now.  Stage IV CKD: Creatinine close to usual baseline.  Follow.  Chronic diastolic heart failure: Reasonably well compensated-resume  Coreg and Demadex.  Dyslipidemia: Continue statin  DM-2: CBG stable-but on the higher side-start Lantus 10 units nightly (appears to be on 20 units at SNF)-continue SSI and follow.  Hypothyroidism: Continue Synthroid  Schizophrenia: Apparently has a longstanding history of schizophrenia since she was 67 years old.  Risperidone currently on hold due to concerns that it may have caused encephalopathy.  Has been restarted on Cymbalta and Neurontin which she seems to be tolerating well.  Have consulted psychiatry to see if patient needs a alternate agent to Risperdal.  Dysphagia: Initially kept n.p.o. postextubation-but upon follow-up by speech therapy-has been started on a dysphagia 3 diet.  Per nursing staff she has been diet tolerating well.   DVT Prophylaxis: Prophylactic Heparin   Code Status: Full code   Family Communication: None at bedside  Disposition Plan: Remain inpatient- SNF on discharge-hopefully tomorrow.  Antimicrobial agents: Anti-infectives (From admission, onward)   None      Procedures: 12/12 ETT >> 12/13  CONSULTS:  pulmonary/intensive care, neurology and psychiatry  Time spent: 25- minutes-Greater than 50% of this time was spent in counseling, explanation of diagnosis, planning of further management, and coordination of care.  MEDICATIONS: Scheduled Meds: . arformoterol  15 mcg Nebulization BID  . aspirin  81 mg Oral Daily  . atorvastatin  40 mg Per Tube q1800  . budesonide (PULMICORT) nebulizer solution  0.5 mg Nebulization BID  . DULoxetine  60 mg Oral Daily  . famotidine  20 mg Oral Daily  . gabapentin  300 mg Oral BID  . gabapentin  600 mg Oral QHS  . heparin  5,000 Units Subcutaneous Q8H  .  insulin aspart  0-20 Units Subcutaneous TID WC  . insulin aspart  0-5 Units Subcutaneous QHS  . levothyroxine  25 mcg Oral Q0600  . montelukast  10 mg Oral QHS   Continuous Infusions: PRN Meds:.albuterol, bisacodyl, docusate sodium, RESOURCE THICKENUP  CLEAR   PHYSICAL EXAM: Vital signs: Vitals:   07/29/18 2144 07/30/18 0454 07/30/18 0732 07/30/18 0846  BP:    (!) 167/78  Pulse:      Resp:      Temp: 98.2 F (36.8 C)   98.2 F (36.8 C)  TempSrc: Oral   Oral  SpO2:   99%   Weight:  97.5 kg    Height:       Filed Weights   07/27/18 1507 07/28/18 0451 07/30/18 0454  Weight: 102 kg 99.4 kg 97.5 kg   Body mass index is 38.08 kg/m.   General appearance :Awake, alert, not in any distress.  Eyes:Pink conjunctiva HEENT: Atraumatic and Normocephalic Neck: supple Resp:Good air entry bilaterally, no added sounds  CVS: S1 S2 regular, no murmurs.  GI: Bowel sounds present, Non tender and not distended with no gaurding, rigidity or rebound.No organomegaly Extremities: B/L Lower Ext shows no edema, both legs are warm to touch Neurology:  speech clear,Non focal, sensation is grossly intact. Musculoskeletal:No digital cyanosis Skin:No Rash, warm and dry Wounds:N/A  I have personally reviewed following labs and imaging studies  LABORATORY DATA: CBC: Recent Labs  Lab 07/27/18 1500 07/27/18 1518 07/28/18 0404 07/29/18 0213 07/30/18 0526  WBC 8.4  --  10.3 7.1 7.7  NEUTROABS 6.4  --   --   --   --   HGB 9.4* 9.2* 8.8* 8.4* 8.2*  HCT 28.5* 27.0* 26.5* 26.5* 26.0*  MCV 97.6  --  96.4 97.8 98.9  PLT 203  --  123* 182 601    Basic Metabolic Panel: Recent Labs  Lab 07/27/18 1500 07/27/18 1518 07/28/18 0404 07/29/18 0213 07/30/18 0526  NA 138 138 141 143 142  K 4.4 4.4 4.1 3.9 4.4  CL 105 103 102 110 107  CO2 25  --  25 23 24   GLUCOSE 233* 232* 130* 105* 252*  BUN 43* 38* 43* 37* 44*  CREATININE 2.33* 2.40* 2.37* 2.27* 2.50*  CALCIUM 9.0  --  8.9 8.6* 8.6*  MG  --   --  2.0 2.2  --   PHOS  --   --  4.8*  --   --     GFR: Estimated Creatinine Clearance: 24.6 mL/min (A) (by C-G formula based on SCr of 2.5 mg/dL (H)).  Liver Function Tests: Recent Labs  Lab 07/27/18 1500  AST 20  ALT 28  ALKPHOS 89  BILITOT  0.3  PROT 6.8  ALBUMIN 3.2*   No results for input(s): LIPASE, AMYLASE in the last 168 hours. No results for input(s): AMMONIA in the last 168 hours.  Coagulation Profile: Recent Labs  Lab 07/27/18 1500  INR 0.91    Cardiac Enzymes: No results for input(s): CKTOTAL, CKMB, CKMBINDEX, TROPONINI in the last 168 hours.  BNP (last 3 results) No results for input(s): PROBNP in the last 8760 hours.  HbA1C: No results for input(s): HGBA1C in the last 72 hours.  CBG: Recent Labs  Lab 07/29/18 0704 07/29/18 1103 07/29/18 1644 07/29/18 2109 07/30/18 0828  GLUCAP 92 164* 227* 210* 201*    Lipid Profile: Recent Labs    07/28/18 0404  TRIG 237*    Thyroid Function Tests: Recent Labs    07/28/18 0404  TSH 2.047    Anemia Panel: No results for input(s): VITAMINB12, FOLATE, FERRITIN, TIBC, IRON, RETICCTPCT in the last 72 hours.  Urine analysis:    Component Value Date/Time   COLORURINE STRAW (A) 07/27/2018 1630   APPEARANCEUR CLEAR 07/27/2018 1630   LABSPEC 1.006 07/27/2018 1630   PHURINE 6.0 07/27/2018 1630   GLUCOSEU 50 (A) 07/27/2018 1630   HGBUR NEGATIVE 07/27/2018 1630   HGBUR negative 10/20/2009 1425   BILIRUBINUR NEGATIVE 07/27/2018 1630   KETONESUR NEGATIVE 07/27/2018 1630   PROTEINUR 100 (A) 07/27/2018 1630   UROBILINOGEN 0.2 10/05/2011 1002   NITRITE NEGATIVE 07/27/2018 1630   LEUKOCYTESUR NEGATIVE 07/27/2018 1630    Sepsis Labs: Lactic Acid, Venous    Component Value Date/Time   LATICACIDVEN 1.80 04/19/2018 0036    MICROBIOLOGY: Recent Results (from the past 240 hour(s))  MRSA PCR Screening     Status: None   Collection Time: 07/27/18 11:56 PM  Result Value Ref Range Status   MRSA by PCR NEGATIVE NEGATIVE Final    Comment:        The GeneXpert MRSA Assay (FDA approved for NASAL specimens only), is one component of a comprehensive MRSA colonization surveillance program. It is not intended to diagnose MRSA infection nor to guide  or monitor treatment for MRSA infections. Performed at [facility], [place] [address], [place], [place] [address]     RADIOLOGY STUDIES/RESULTS: Mr Virgel Paling GL Contrast  Result Date: 07/27/2018 CLINICAL DATA:  Acute onset somnolence.  Altered mental status. EXAM: MRI HEAD WITHOUT CONTRAST MRA HEAD WITHOUT CONTRAST TECHNIQUE: Multiplanar, multiecho pulse sequences of the brain and surrounding structures were obtained without intravenous contrast. Angiographic images of the head were obtained using MRA technique without contrast. COMPARISON:  Head CT 07/27/2018 Brain MRI 04/19/2018 FINDINGS: An abbreviated protocol was performed. Sagittal T1, axial T2, axial T1 and coronal T2-weighted imaging was not obtained. MRI HEAD FINDINGS BRAIN: There is no acute infarct, acute hemorrhage or mass effect. The midline structures are normal. There are no old infarcts. Multifocal white matter hyperintensity, most commonly due to chronic ischemic microangiopathy. Mild generalized atrophy. SKULL AND UPPER CERVICAL SPINE: The visualized skull base, calvarium, upper cervical spine and extracranial soft tissues are normal. SINUSES/ORBITS: No fluid levels or advanced mucosal thickening. No mastoid or middle ear effusion. The orbits are normal. MRA HEAD FINDINGS Intracranial internal carotid arteries: Normal. Anterior cerebral arteries: Normal. Middle cerebral arteries: Normal. Posterior communicating arteries: Present on the right only. Posterior cerebral arteries: Normal. Basilar artery: Normal. Vertebral arteries: Left dominant. Normal. Superior cerebellar arteries: Normal. Inferior cerebellar arteries: Normal. IMPRESSION: 1. No acute intracranial abnormality. 2. Findings of chronic microvascular ischemia. 3. Normal intracranial MRA. Electronically Signed   By: Ulyses Jarred M.D.   On: 07/27/2018 19:56   Mr Brain Wo Contrast  Result Date: 07/27/2018 CLINICAL DATA:  Acute onset somnolence.  Altered mental status.  EXAM: MRI HEAD WITHOUT CONTRAST MRA HEAD WITHOUT CONTRAST TECHNIQUE: Multiplanar, multiecho pulse sequences of the brain and surrounding structures were obtained without intravenous contrast. Angiographic images of the head were obtained using MRA technique without contrast. COMPARISON:  Head CT 07/27/2018 Brain MRI 04/19/2018 FINDINGS: An abbreviated protocol was performed. Sagittal T1, axial T2, axial T1 and coronal T2-weighted imaging was not obtained. MRI HEAD FINDINGS BRAIN: There is no acute infarct, acute hemorrhage or mass effect. The midline structures are normal. There are no old infarcts. Multifocal white matter hyperintensity, most commonly due to chronic ischemic microangiopathy. Mild generalized atrophy. SKULL AND UPPER  CERVICAL SPINE: The visualized skull base, calvarium, upper cervical spine and extracranial soft tissues are normal. SINUSES/ORBITS: No fluid levels or advanced mucosal thickening. No mastoid or middle ear effusion. The orbits are normal. MRA HEAD FINDINGS Intracranial internal carotid arteries: Normal. Anterior cerebral arteries: Normal. Middle cerebral arteries: Normal. Posterior communicating arteries: Present on the right only. Posterior cerebral arteries: Normal. Basilar artery: Normal. Vertebral arteries: Left dominant. Normal. Superior cerebellar arteries: Normal. Inferior cerebellar arteries: Normal. IMPRESSION: 1. No acute intracranial abnormality. 2. Findings of chronic microvascular ischemia. 3. Normal intracranial MRA. Electronically Signed   By: Ulyses Jarred M.D.   On: 07/27/2018 19:56   Nm Pulmonary Perf And Vent  Result Date: 07/15/2018 CLINICAL DATA:  Intermediate probability for pulmonary embolus. Positive D-dimer. EXAM: NUCLEAR MEDICINE VENTILATION - PERFUSION LUNG SCAN TECHNIQUE: Ventilation images were obtained in multiple projections using inhaled aerosol Tc-9m DTPA. Perfusion images were obtained in multiple projections after intravenous injection of Tc-32m  MAA. RADIOPHARMACEUTICALS:  30.1 mCi of Tc-1m DTPA aerosol inhalation and 4 mCi Tc79m MAA IV COMPARISON:  None. FINDINGS: Ventilation: There is a clumping of the radiopharmaceutical within the central airways compatible with turbulent air flow secondary to COPD. No wedge-shaped ventilation abnormalities identified. Perfusion: No wedge shaped peripheral perfusion defects to suggest acute pulmonary embolism. IMPRESSION: 1. No evidence for pulmonary emboli.  Very low probability. 2. COPD. Electronically Signed   By: Kerby Moors M.D.   On: 07/15/2018 10:17   US Venous Img Lower Bilateral  Result Date: 07/15/2018 CLINICAL DATA:  Elevated D-dimer EXAM: BILATERAL LOWER EXTREMITY VENOUS DUPLEX ULTRASOUND TECHNIQUE: Doppler venous assessment of the bilateral lower extremity deep venous system was performed, including characterization of spectral flow, compressibility, and plasticity. COMPARISON:  None. FINDINGS: There is complete compressibility of the bilateral common femoral, femoral, and popliteal veins. Doppler analysis demonstrates respiratory phasicity and augmentation of flow with calf compression. No obvious superficial vein or calf vein thrombosis. IMPRESSION: No evidence of lower extremity DVT Electronically Signed   By: Marybelle Killings M.D.   On: 07/15/2018 10:33   Dg Chest Port 1 View  Result Date: 07/28/2018 CLINICAL DATA:  Hypoxia EXAM: PORTABLE CHEST 1 VIEW COMPARISON:  July 27, 2018 FINDINGS: Endotracheal tube tip is 2.2 cm above the carina. Nasogastric tube tip and side port are below the diaphragm. No pneumothorax. There is atelectatic change in the lung bases. The lungs elsewhere are clear. Heart is mildly enlarged with pulmonary vascularity normal. No adenopathy. No appreciable bone lesions. IMPRESSION: Tube positions as described without pneumothorax. Bibasilar atelectasis. No frank airspace consolidation. Stable cardiac prominence. Electronically Signed   By: Lowella Grip III M.D.    On: 07/28/2018 07:22   Dg Chest Portable 1 View  Result Date: 07/27/2018 CLINICAL DATA:  Tube placement. EXAM: PORTABLE CHEST 1 VIEW COMPARISON:  07/14/2018. FINDINGS: Endotracheal tube tip noted approximately 7 mm above the lower portion of the carina. Proximal repositioning of approximately 2 cm should be considered. NG tube noted with tip below left hemidiaphragm. Cardiomegaly with pulmonary venous congestion bilateral interstitial prominence. Findings consistent with CHF. No pneumothorax. IMPRESSION: 1. Endotracheal tube noted with its tip approximately 7 mm above the lower portion of the carina. Proximal repositioning of approximately 2 cm should be considered. NG tube noted with tip below left hemidiaphragm. 2. Cardiomegaly with pulmonary venous congestion bilateral interstitial prominence consistent with CHF. Electronically Signed   By: Marcello Moores  Register   On: 07/27/2018 16:23   Dg Chest Port 1 View  Result Date: 07/14/2018 CLINICAL DATA:  Chest pain and shortness of breath EXAM: PORTABLE CHEST 1 VIEW COMPARISON:  04/18/2018 FINDINGS: There is shallow lung inflation with pulmonary vascular congestion. Mild cardiomegaly. No pleural effusion or pneumothorax. No focal airspace consolidation. IMPRESSION: Cardiomegaly and pulmonary vascular congestion. Electronically Signed   By: Ulyses Jarred M.D.   On: 07/14/2018 21:19   Ct Head Code Stroke Wo Contrast  Result Date: 07/27/2018 CLINICAL DATA:  Code stroke.  Altered mental status EXAM: CT HEAD WITHOUT CONTRAST TECHNIQUE: Contiguous axial images were obtained from the base of the skull through the vertex without intravenous contrast. COMPARISON:  Head CT 04/18/2018 FINDINGS: Brain: There is no mass, hemorrhage or extra-axial collection. The size and configuration of the ventricles and extra-axial CSF spaces are normal. There is a prominent perivascular space of the left lentiform nucleus, unchanged. Mild white matter hypoattenuation compatible with  chronic small vessel disease. Vascular: No abnormal hyperdensity of the major intracranial arteries or dural venous sinuses. No intracranial atherosclerosis. Skull: The visualized skull base, calvarium and extracranial soft tissues are normal. Sinuses/Orbits: No fluid levels or advanced mucosal thickening of the visualized paranasal sinuses. No mastoid or middle ear effusion. The orbits are normal. ASPECTS [facility] Stroke Program Early CT Score) - Ganglionic level infarction (caudate, lentiform nuclei, internal capsule, insula, M1-M3 cortex): 7 - Supraganglionic infarction (M4-M6 cortex): 3 Total score (0-10 with 10 being normal): 10 IMPRESSION: 1. No hemorrhage or other acute abnormality. 2. ASPECTS is 10. These results were called by telephone at the time of interpretation on 07/27/2018 at 3:07 pm to Dr. Nanda Quinton , who verbally acknowledged these results. Electronically Signed   By: Ulyses Jarred M.D.   On: 07/27/2018 15:08     LOS: 3 days   Oren Binet, MD  [facility]  If 7PM-7AM, please contact night-coverage  Please page via [email or web address] [email or web address]-click on MD name and type text message  07/30/2018, 10:02 AM

## 2018-07-30 NOTE — Consult Note (Signed)
[facility] Face-to-Face Psychiatry Consult   Reason for Consult: ''medication management for alternate anti-psychotic'' Referring Physician:  Dr. Evalee Mutton Patient Identification: [name] MRN:  [id number] Principal Diagnosis: Schizophrenia [facility]) Diagnosis:  Principal Problem:   Schizophrenia ([place]) Active Problems:   Type 2 diabetes mellitus with diabetic polyneuropathy, without long-term current use of insulin (HCC)   Chronic diastolic heart failure (HCC)   CKD (chronic kidney disease), stage IV ([place])   Acute encephalopathy   Endotracheally intubated   Hypothyroidism   Total Time spent with patient: 45 minutes  Subjective:   [name] is a 67 y.o. female patient admitted with altered mental status.  HPI: 67 y.o. female resident of a local SNF-with prior history of chronic diastolic heart failure, DM-2, hypertension, CKD stage IV, OSA on CPAP and Schizophrenia who was admitted due to acute encephalopathy and weakness, patient was intubated in the emergency room for airway protection and admitted to the ICU. Today, patient is alert, oriented and reports that she did not remember the circumstances of her admission but she thankful that she is alive. Patient states that she has been dealing with Schizophrenia since age of 47, receives medication management from Dr. Hoyle Barr at [facility]. She has had a trial of multiple ant-psychotic, denies prior adverse reactions to any of her medications. Currently, she reports AVH, paranoia but denies mood swings, SI/HI. She reports hearing voices telling her to do good stuffs, sees monster since childhood and thinks people at her facility are out to get her. She is requesting to get on another medication to address her symptoms.  Past Psychiatric History: as above Risk to Self:  denies Risk to Others:  denies Prior Inpatient Therapy:   Prior Outpatient Therapy:  [facility]  Past Medical History:  Past Medical History:  Diagnosis Date  . Anxiety   . Asthma    . Back pain   . Barrett esophagus   . CHF (congestive heart failure) ([place])   . CKD (chronic kidney disease) stage 3, GFR 30-59 ml/min (HCC)   . COPD (chronic obstructive pulmonary disease) ([place])   . Depression   . Diastolic heart failure ([place])   . Essential hypertension   . Fibromyalgia   . GERD (gastroesophageal reflux disease)   . Gout   . History of pericarditis   . Hyperlipidemia   . Hypothyroid   . Major depressive disorder   . Memory loss   . Nephrolithiasis    2008  . Obesity hypoventilation syndrome (HCC)    CPAP  . Obstructive sleep apnea   . PE (pulmonary embolism)    2010  . Peripheral neuropathy   . PTSD (post-traumatic stress disorder)   . Rheumatoid arthritis ([place])   . Schizophrenia ([place])   . Steroid dependence   . Type 2 diabetes mellitus ([place])   . Vitamin D deficiency     Past Surgical History:  Procedure Laterality Date  . APPENDECTOMY    . CHOLECYSTECTOMY    . COLONOSCOPY     benign polyps (12/2000), repeat colonoscopy performed in 2007  . ENDOMETRIAL BIOPSY     10/03 benign columnar mucosa (limited material)  . KIDNEY SURGERY     Family History:  Family History  Problem Relation Age of Onset  . Bone cancer Mother   . Hypertension Mother   . Heart disease Mother   . COPD Father   . Heart disease Father   . Stroke Father    Family Psychiatric  History:  Social History:  Social History  Substance and Sexual Activity  Alcohol Use No     Social History   Substance and Sexual Activity  Drug Use No    Social History   Socioeconomic History  . Marital status: Single    Spouse name: Not on file  . Number of children: Not on file  . Years of education: Not on file  . Highest education level: Not on file  Occupational History  . Not on file  Social Needs  . Financial resource strain: Not on file  . Food insecurity:    Worry: Not on file    Inability: Not on file  . Transportation needs:    Medical: Not on file    Non-medical:  Not on file  Tobacco Use  . Smoking status: Former Smoker    Types: Cigarettes    Last attempt to quit: 06/23/1972    Years since quitting: 46.1  . Smokeless tobacco: Never Used  Substance and Sexual Activity  . Alcohol use: No  . Drug use: No  . Sexual activity: Never  Lifestyle  . Physical activity:    Days per week: Not on file    Minutes per session: Not on file  . Stress: Not on file  Relationships  . Social connections:    Talks on phone: Not on file    Gets together: Not on file    Attends religious service: Not on file    Active member of club or organization: Not on file    Attends meetings of clubs or organizations: Not on file    Relationship status: Not on file  Other Topics Concern  . Not on file  Social History Narrative  . Not on file   Additional Social History:    Allergies:   Allergies  Allergen Reactions  . Benztropine Mesylate Other (See Comments)    Alopecia and white spots on eyes  . Codeine Swelling, Palpitations and Other (See Comments)    Mouth Swelling and Tachycardia   . Diazepam Other (See Comments)    COMA  . Diphenhydramine Hcl Anxiety, Palpitations and Other (See Comments)    Tachycardia and anxiousness  . Penicillins Other (See Comments)    REACTION: ulcers in mouth, swelling, throat swelling  Has patient had a PCN reaction causing immediate rash, facial/tongue/throat swelling, SOB or lightheadedness with hypotension: YES Has patient had a PCN reaction causing severe rash involving Mucus membranes or skin necrosis: Unknown Has patient had a PCN reaction that required hospitalization Unknown Has patient had a PCN reaction occurring within the last 10 years: unknown If all of the above answers are "NO", then may proceed with Cephalospori  . Sulfonamide Derivatives Rash    Blisters, also  . Thioridazine Hcl Swelling  . Benztropine Other (See Comments)    "Alopecia and white spots on eyes"  . Penicillin G Swelling and Other (See  Comments)    Ulcers in mouth & tongue became swollen Has patient had a PCN reaction causing immediate rash, facial/tongue/throat swelling, SOB or lightheadedness with hypotension: Yes Has patient had a PCN reaction causing severe rash involving mucus membranes or skin necrosis: Unk Has patient had a PCN reaction that required hospitalization: Unk Has patient had a PCN reaction occurring within the last 10 years: Unk If all of the above answers are "NO", then may proceed with Cephalosporin use.   [name] Kitchen Lisinopril Cough  . Sulfamethoxazole Rash    Labs:  Results for orders placed or performed during the hospital encounter of 07/27/18 (from  the past 48 hour(s))  Glucose, capillary     Status: None   Collection Time: 07/28/18  7:15 PM  Result Value Ref Range   Glucose-Capillary 89 70 - 99 mg/dL  Glucose, capillary     Status: None   Collection Time: 07/28/18 11:17 PM  Result Value Ref Range   Glucose-Capillary 92 70 - 99 mg/dL  Basic metabolic panel     Status: Abnormal   Collection Time: 07/29/18  2:13 AM  Result Value Ref Range   Sodium 143 135 - 145 mmol/L   Potassium 3.9 3.5 - 5.1 mmol/L   Chloride 110 98 - 111 mmol/L   CO2 23 22 - 32 mmol/L   Glucose, Bld 105 (H) 70 - 99 mg/dL   BUN 37 (H) 8 - 23 mg/dL   Creatinine, Ser 2.27 (H) 0.44 - 1.00 mg/dL   Calcium 8.6 (L) 8.9 - 10.3 mg/dL   GFR calc non Af Amer 22 (L) >60 mL/min   GFR calc Af Amer 25 (L) >60 mL/min   Anion gap 10 5 - 15    Comment: Performed at [facility], [place] [address], [place], [place] [address]  Magnesium     Status: None   Collection Time: 07/29/18  2:13 AM  Result Value Ref Range   Magnesium 2.2 1.7 - 2.4 mg/dL    Comment: Performed at [place] [address] [place],  [address]  CBC     Status: Abnormal   Collection Time: 07/29/18  2:13 AM  Result Value Ref Range   WBC 7.1 4.0 - 10.5 K/uL   RBC 2.71 (L) 3.87 - 5.11 MIL/uL   Hemoglobin 8.4 (L) 12.0 - 15.0 g/dL   HCT 26.5 (L) 36.0  - 46.0 %   MCV 97.8 80.0 - 100.0 fL   MCH 31.0 26.0 - 34.0 pg   MCHC 31.7 30.0 - 36.0 g/dL   RDW 13.9 11.5 - 15.5 %   Platelets 182 150 - 400 K/uL   nRBC 0.0 0.0 - 0.2 %    Comment: Performed at [facility], [place] [address], [place], [place] [address]  Glucose, capillary     Status: None   Collection Time: 07/29/18  4:32 AM  Result Value Ref Range   Glucose-Capillary 99 70 - 99 mg/dL  Glucose, capillary     Status: None   Collection Time: 07/29/18  7:04 AM  Result Value Ref Range   Glucose-Capillary 92 70 - 99 mg/dL  Glucose, capillary     Status: Abnormal   Collection Time: 07/29/18 11:03 AM  Result Value Ref Range   Glucose-Capillary 164 (H) 70 - 99 mg/dL  Glucose, capillary     Status: Abnormal   Collection Time: 07/29/18  4:44 PM  Result Value Ref Range   Glucose-Capillary 227 (H) 70 - 99 mg/dL  Glucose, capillary     Status: Abnormal   Collection Time: 07/29/18  9:09 PM  Result Value Ref Range   Glucose-Capillary 210 (H) 70 - 99 mg/dL   Comment 1 Notify RN    Comment 2 Document in Chart   Basic metabolic panel     Status: Abnormal   Collection Time: 07/30/18  5:26 AM  Result Value Ref Range   Sodium 142 135 - 145 mmol/L   Potassium 4.4 3.5 - 5.1 mmol/L   Chloride 107 98 - 111 mmol/L   CO2 24 22 - 32 mmol/L   Glucose, Bld 252 (H) 70 -  99 mg/dL   BUN 44 (H) 8 - 23 mg/dL   Creatinine, Ser 2.50 (H) 0.44 - 1.00 mg/dL   Calcium 8.6 (L) 8.9 - 10.3 mg/dL   GFR calc non Af Amer 19 (L) >60 mL/min   GFR calc Af Amer 22 (L) >60 mL/min   Anion gap 11 5 - 15    Comment: Performed at [facility] [address] [place], [place] [address]  CBC     Status: Abnormal   Collection Time: 07/30/18  5:26 AM  Result Value Ref Range   WBC 7.7 4.0 - 10.5 K/uL   RBC 2.63 (L) 3.87 - 5.11 MIL/uL   Hemoglobin 8.2 (L) 12.0 - 15.0 g/dL   HCT 26.0 (L) 36.0 - 46.0 %   MCV 98.9 80.0 - 100.0 fL   MCH 31.2 26.0 - 34.0 pg   MCHC 31.5 30.0 - 36.0 g/dL   RDW 13.4 11.5 - 15.5 %    Platelets 165 150 - 400 K/uL   nRBC 0.0 0.0 - 0.2 %    Comment: Performed at [facility], [place] [address] [place], [place] [address]  Glucose, capillary     Status: Abnormal   Collection Time: 07/30/18  8:28 AM  Result Value Ref Range   Glucose-Capillary 201 (H) 70 - 99 mg/dL  Glucose, capillary     Status: Abnormal   Collection Time: 07/30/18 12:16 PM  Result Value Ref Range   Glucose-Capillary 269 (H) 70 - 99 mg/dL    Current Facility-Administered Medications  Medication Dose Route Frequency Provider Last Rate Last Dose  . albuterol (PROVENTIL) (2.5 MG/3ML) 0.083% nebulizer solution 2.5 mg  2.5 mg Nebulization Q4H PRN Collene Gobble, MD      . arformoterol [facility]) nebulizer solution 15 mcg  15 mcg Nebulization BID Collene Gobble, MD   15 mcg at 07/30/18 0732  . aspirin chewable tablet 81 mg  81 mg Oral Daily Collene Gobble, MD   81 mg at 07/30/18 1660  . atorvastatin (LIPITOR) tablet 40 mg  40 mg Per Tube q1800 Collene Gobble, MD   40 mg at 07/29/18 1652  . bisacodyl (DULCOLAX) suppository 10 mg  10 mg Rectal Daily PRN Collene Gobble, MD      . budesonide (PULMICORT) nebulizer solution 0.5 mg  0.5 mg Nebulization BID Collene Gobble, MD   0.5 mg at 07/30/18 0732  . carvedilol (COREG) tablet 25 mg  25 mg Oral BID WC Ghimire, Shanker M, MD      . docusate sodium (COLACE) capsule 100 mg  100 mg Oral BID PRN Collene Gobble, MD      . DULoxetine (CYMBALTA) DR capsule 60 mg  60 mg Oral Daily Collene Gobble, MD   60 mg at 07/30/18 0835  . famotidine (PEPCID) tablet 20 mg  20 mg Oral Daily Collene Gobble, MD   20 mg at 07/30/18 0835  . gabapentin (NEURONTIN) capsule 300 mg  300 mg Oral BID Corinda Gubler, RPH   300 mg at 07/30/18 6301  . gabapentin (NEURONTIN) capsule 600 mg  600 mg Oral QHS Laqueta Linden A, RPH   600 mg at 07/29/18 2151  . heparin injection 5,000 Units  5,000 Units Subcutaneous Q8H Collene Gobble, MD   5,000 Units at 07/30/18 1226  . insulin aspart  (novoLOG) injection 0-20 Units  0-20 Units Subcutaneous TID WC Collene Gobble, MD   7 Units at 07/30/18 1226  .  insulin aspart (novoLOG) injection 0-5 Units  0-5 Units Subcutaneous QHS Collene Gobble, MD   2 Units at 07/29/18 2151  . insulin glargine (LANTUS) injection 10 Units  10 Units Subcutaneous QHS Ghimire, Shanker M, MD      . levothyroxine (SYNTHROID, LEVOTHROID) tablet 25 mcg  25 mcg Oral Q0600 Collene Gobble, MD   25 mcg at 07/30/18 [id number]  . montelukast (SINGULAIR) tablet 10 mg  10 mg Oral QHS Collene Gobble, MD   10 mg at 07/29/18 2151  . [place]   Oral PRN Renee Pain, MD      . torsemide [facility]) tablet 40 mg  40 mg Oral Daily Jonetta Osgood, MD   40 mg at 07/30/18 1226    Musculoskeletal: Strength & Muscle Tone: not tested Gait & Station: unable to stand Patient leans: N/A  Psychiatric Specialty Exam: Physical Exam  Psychiatric: Her speech is normal. Judgment normal. Her affect is blunt. She is slowed and actively hallucinating. Thought content is paranoid. Cognition and memory are normal.    Review of Systems  Psychiatric/Behavioral: Positive for hallucinations.    Blood pressure (!) 151/80, pulse 82, temperature 97.8 F (36.6 C), temperature source Oral, resp. rate 18, height 5\' 3"  (1.6 m), weight 97.5 kg, SpO2 99 %.Body mass index is 38.08 kg/m.  General Appearance: Casual  Eye Contact:  Good  Speech:  Clear and Coherent  Volume:  Normal  Mood:  Euthymic  Affect:  Constricted  Thought Process:  Coherent  Orientation:  Full (Time, Place, and Person)  Thought Content:  Hallucinations: Auditory Visual and Paranoid Ideation  Suicidal Thoughts:  No  Homicidal Thoughts:  No  Memory:  Immediate;   Fair Recent;   Fair Remote;   Fair  Judgement:  Fair  Insight:  Fair  Psychomotor Activity:  Psychomotor Retardation  Concentration:  Concentration: Fair and Attention Span: Fair  Recall:  [employer] of Knowledge:  Fair  Language:  Good   Akathisia:  No  Handed:  Right  AIMS (if indicated):     Assets:  Communication Skills Desire for Improvement  ADL's:  marginal  Cognition:  WNL  Sleep:        Treatment Plan Summary: -Continue Cymbalta to address mood -Consider Haldol 0.5 mg Twice daily for psychosis and delusions -Schedule outpatient appointment with patient's  psychiatrist (Dr. Hoyle Barr at [facility] ) for medication management upon discharge. -Psychiatric service signing out. Re-consult psych as needed.  Disposition: No evidence of imminent risk to self or others at present.   Supportive therapy provided about ongoing stressors.  Corena Pilgrim, MD 07/30/2018 3:03 PM

## 2018-07-31 DIAGNOSIS — F203 Undifferentiated schizophrenia: Secondary | ICD-10-CM

## 2018-07-31 LAB — GLUCOSE, CAPILLARY
Glucose-Capillary: 264 mg/dL — ABNORMAL HIGH (ref 70–99)
Glucose-Capillary: 384 mg/dL — ABNORMAL HIGH (ref 70–99)
Glucose-Capillary: 249 mg/dL — ABNORMAL HIGH (ref 70–99)
Glucose-Capillary: 205 mg/dL — ABNORMAL HIGH (ref 70–99)
Glucose-Capillary: 193 mg/dL — ABNORMAL HIGH (ref 70–99)
Glucose-Capillary: 170 mg/dL — ABNORMAL HIGH (ref 70–99)

## 2018-07-31 MED ORDER — HALOPERIDOL 0.5 MG PO TABS
0.5000 mg | ORAL_TABLET | Freq: Two times a day (BID) | ORAL | Status: DC
  Administered 2018-07-31 – 2018-08-01 (×3): 0.5 mg via ORAL
  Filled 2018-07-31 (×3): qty 1

## 2018-07-31 MED ORDER — ARFORMOTEROL TARTRATE 15 MCG/2ML IN NEBU: 15.0000 ug | INHALATION_SOLUTION | Freq: Two times a day (BID) | RESPIRATORY_TRACT | Status: DC

## 2018-07-31 MED ORDER — AMLODIPINE BESYLATE 5 MG PO TABS: 5.0000 mg | ORAL_TABLET | Freq: Every day | ORAL | 11 refills | Status: DC

## 2018-07-31 MED ORDER — HALOPERIDOL 0.5 MG PO TABS: 0.5000 mg | ORAL_TABLET | Freq: Two times a day (BID) | ORAL | Status: DC

## 2018-07-31 NOTE — Progress Notes (Signed)
Placed patient on Room air with  SpO2 at 100% in the bed  Sitting on side of the bed 99% Standing 98% Walking 96%  Showed no signs of distress while walking.

## 2018-07-31 NOTE — Clinical Social Work Note (Signed)
Clinical Social Work Assessment  Patient Details  Name: [name] MRN: [id number] Date of Birth: 07/24/1951  Date of referral:  07/31/18               Reason for consult:  Facility Placement, Discharge Planning                Permission sought to share information with:  Facility Sport and exercise psychologist, Family Supports Permission granted to share information::  Yes, Verbal Permission Granted  Name::     [place]::  [facility]  Relationship::   Facility  Contact Information:   [phone]  Housing/Transportation Living arrangements for the past 2 months:  [place] of Information:  Patient, Facility Patient Interpreter Needed:  None Criminal Activity/Legal Involvement Pertinent to Current Situation/Hospitalization:  No - Comment as needed Significant Relationships:    Lives with:  Facility Resident Do you feel safe going back to the place where you live?  Yes Need for family participation in patient care:  Yes (Comment)  Care giving concerns:  Pt LTC at [facility]. At baseline she has been using a wheelchair due to diabetic foot ulcer. Pt eager to return to facility.   Social Worker assessment / plan:  Pt from ALF, she has been living there longer than she indicates upon assessment according to staff at ALF. Pt eager to return to ALF, she states that she normally uses her wheelchair to stay off her feet but was able to ambulate here at hospital. Let her know RN and PT would see her at facility. She acknowledges this.   All information sent to ALF. Await confirmation that pt can return today. PTAR papers on chart.  Employment status:  Disabled (Comment on whether or not currently receiving Disability) Insurance information:  Medicaid [place], [place] PT Recommendations:  [place] / Referral to community resources:  Other (Comment Required)(ALF)  Patient/Family's Response to care:  Pt eager to return to SNF,  amenable to speaking with CSW.  Patient/Family's Understanding of and Emotional Response to Diagnosis, Current Treatment, and Prognosis:  Pt facility is main support for pt. Pt states understanding of why she is in the hospital. She has cleared mentally per notes since admission. She was smiling and pleasant upon eval, waiting to return to ALF.  Emotional Assessment Appearance:  Appears stated age Attitude/Demeanor/Rapport:  Gracious Affect (typically observed):  Accepting, Adaptable Orientation:  Oriented to Self, Oriented to Place, Oriented to  Time, Oriented to Situation, Fluctuating Orientation (Suspected and/or reported [place]) Alcohol / Substance use:  Not Applicable Psych involvement (Current and /or in the community):  Outpatient Provider  Discharge Needs  Concerns to be addressed:  Care Coordination Readmission within the last 30 days:  Yes Current discharge risk:  Cognitively Impaired, Chronically ill Barriers to Discharge:  Barriers Resolved   [place] 07/31/2018, 3:58 PM

## 2018-07-31 NOTE — NC FL2 (Signed)
Center MEDICAID FL2 LEVEL OF CARE SCREENING TOOL     IDENTIFICATION  Patient Name: [name] Birthdate: July 16, 1951 Sex: female Admission Date (Current Location): 07/27/2018  [facility] and [place] Number:  [employer] and Address:  The . [facility], [place] [address], [place], [place] [address]      Provider Number: [id number]  Attending Physician Name and Address:  Jonetta Osgood, MD  Relative Name and Phone Number:  Mady Haagensen; niece; [phone]    Current Level of Care: Hospital Recommended Level of Care: [place] Prior Approval Number:    Date Approved/Denied:   PASRR Number:    Discharge Plan: Other (Comment)([facility])    Current Diagnoses: Patient Active Problem List   Diagnosis Date Noted  . Acute encephalopathy 07/27/2018  . Endotracheally intubated 07/27/2018  . Hypothyroidism 07/27/2018  . Pleuritic chest pain 07/14/2018  . Moderate persistent asthma without complication   . Type 2 diabetes mellitus ([place]) 04/19/2018  . Acute kidney injury superimposed on chronic kidney disease ([place]) 01/13/2018  . Altered mental status   . Encephalopathy 10/11/2016  . Chest pain 08/12/2016  . CKD (chronic kidney disease), stage IV ([place]) 08/12/2016  . History of pulmonary embolus (PE) 08/12/2016  . Obesity, Class III, BMI 40-49.9 (morbid obesity) ([place]) 08/12/2016  . RBBB 08/12/2016  . Positive D dimer 08/12/2016  . CVA (cerebral infarction) 11/10/2015  . Physical deconditioning 07/01/2011  . Weakness 07/01/2011  . Diabetic foot ulcer ([place]) 05/19/2011  . Polypharmacy 02/09/2011  . BACK PAIN WITH RADICULOPATHY 05/25/2010  . Type 2 diabetes mellitus with diabetic polyneuropathy, without long-term current use of insulin ([place]) 08/18/2009  . Chronic diastolic heart failure ([place]) 04/25/2009  . Normocytic anemia 04/14/2009  . Sleep apnea 12/11/2008  . Diabetic peripheral neuropathy ([place]) 05/04/2007  .  HYPERCHOLESTEROLEMIA 10/13/2006  . Gout, unspecified 10/13/2006  . HYPOKALEMIA 10/13/2006  . Schizophrenia ([place]) 10/13/2006  . Depression 10/13/2006  . Essential hypertension 10/13/2006  . Venous (peripheral) insufficiency 10/13/2006  . RHINITIS, ALLERGIC 10/13/2006  . Asthma 10/13/2006  . Reflux esophagitis 10/13/2006  . OSTEOARTHRITIS, MULTI SITES 10/13/2006  . INCONTINENCE, URGE 10/13/2006    Orientation RESPIRATION BLADDER Height & Weight     Self, Time, Situation, Place(pt has memory loss)  Normal Continent Weight: 214 lb 15.2 oz (97.5 kg) Height:  5\' 3"  (272 cm)  BEHAVIORAL SYMPTOMS/MOOD NEUROLOGICAL BOWEL NUTRITION STATUS      Continent Diet(dysphagia 3 mechanical soft; thin liquids)  AMBULATORY STATUS COMMUNICATION OF NEEDS Skin   Limited Assist Verbally Other (Comment)(diabetic ulcer on foot)                       Personal Care Assistance Level of Assistance  Bathing, Feeding, Dressing Bathing Assistance: Limited assistance Feeding assistance: Independent Dressing Assistance: Limited assistance     Functional Limitations Info  Sight, Hearing, Speech Sight Info: Adequate Hearing Info: Adequate Speech Info: Adequate    SPECIAL CARE FACTORS FREQUENCY  PT (By licensed PT)     PT Frequency: 3x week through [facility]              Contractures      Additional Factors Info  Code Status, Allergies Code Status Info: Full Code Allergies Info: BENZTROPINE MESYLATE, CODEINE, DIAZEPAM, DIPHENHYDRAMINE HCL, PENICILLINS, SULFONAMIDE DERIVATIVES, THIORIDAZINE HCL, BENZTROPINE, PENICILLIN G, LISINOPRIL, SULFAMETHOXAZOLE            Current Medications (07/31/2018):  This is the current hospital active medication list Current Facility-Administered  Medications  Medication Dose Route Frequency Provider Last Rate Last Dose  . albuterol (PROVENTIL) (2.5 MG/3ML) 0.083% nebulizer solution 2.5 mg  2.5 mg Nebulization Q4H PRN Collene Gobble, MD      . arformoterol  [facility]) nebulizer solution 15 mcg  15 mcg Nebulization BID Collene Gobble, MD   15 mcg at 07/31/18 0915  . aspirin chewable tablet 81 mg  81 mg Oral Daily Collene Gobble, MD   81 mg at 07/31/18 1058  . atorvastatin (LIPITOR) tablet 40 mg  40 mg Per Tube q1800 Collene Gobble, MD   40 mg at 07/30/18 1705  . bisacodyl (DULCOLAX) suppository 10 mg  10 mg Rectal Daily PRN Collene Gobble, MD      . budesonide (PULMICORT) nebulizer solution 0.5 mg  0.5 mg Nebulization BID Collene Gobble, MD   0.5 mg at 07/31/18 0915  . carvedilol (COREG) tablet 25 mg  25 mg Oral BID WC Jonetta Osgood, MD   25 mg at 07/31/18 1058  . docusate sodium (COLACE) capsule 100 mg  100 mg Oral BID PRN Collene Gobble, MD   100 mg at 07/30/18 2132  . DULoxetine (CYMBALTA) DR capsule 60 mg  60 mg Oral Daily Collene Gobble, MD   60 mg at 07/31/18 1058  . famotidine (PEPCID) tablet 20 mg  20 mg Oral Daily Collene Gobble, MD   20 mg at 07/31/18 1058  . gabapentin (NEURONTIN) capsule 300 mg  300 mg Oral BID Corinda Gubler, RPH   300 mg at 07/31/18 1058  . gabapentin (NEURONTIN) capsule 600 mg  600 mg Oral QHS Corinda Gubler, RPH   600 mg at 07/30/18 2132  . haloperidol (HALDOL) tablet 0.5 mg  0.5 mg Oral BID Jonetta Osgood, MD   0.5 mg at 07/31/18 1102  . heparin injection 5,000 Units  5,000 Units Subcutaneous Q8H Collene Gobble, MD   5,000 Units at 07/31/18 1412  . insulin aspart (novoLOG) injection 0-20 Units  0-20 Units Subcutaneous TID WC Collene Gobble, MD   7 Units at 07/31/18 1320  . insulin aspart (novoLOG) injection 0-5 Units  0-5 Units Subcutaneous QHS Collene Gobble, MD   5 Units at 07/30/18 2143  . insulin glargine (LANTUS) injection 10 Units  10 Units Subcutaneous QHS Jonetta Osgood, MD   10 Units at 07/30/18 2131  . levothyroxine (SYNTHROID, LEVOTHROID) tablet 25 mcg  25 mcg Oral Q0600 Collene Gobble, MD   25 mcg at 07/31/18 0557  . montelukast (SINGULAIR) tablet 10 mg  10 mg Oral QHS  Collene Gobble, MD   10 mg at 07/30/18 2132  . [place]   Oral PRN Renee Pain, MD      . torsemide [facility]) tablet 40 mg  40 mg Oral Daily Jonetta Osgood, MD   40 mg at 07/31/18 1058     Discharge Medications: Please see discharge summary for a list of discharge medications.  Relevant Imaging Results:  Relevant Lab Results:   Additional Information [phone]; RN requested through [facility] for pt  [place], LCSWA

## 2018-07-31 NOTE — Social Work (Signed)
All paperwork was sent to facility, however after multiple transfers on the phone facility informed CSW that they now cannot take the pt as their admissions liaison has left for the day. They will accept her tomorrow.  CSW made MD and RN aware.  Westley Hummer, MSW, [place] Work [phone]

## 2018-07-31 NOTE — Social Work (Signed)
CSW spoke with pt facility, they are able to take her back. [facility] [phone], fax: [phone]. Pt was not on oxygen at admission, RN states she will attempt to wean, if unable have alerted RN CM Levada Dy that pt will need O2 at facility.  Westley Hummer, MSW, [facility] [phone]

## 2018-07-31 NOTE — Progress Notes (Signed)
  Speech Language Pathology Treatment: Dysphagia  Patient Details Name: [name] MRN: [id number] DOB: September 20, 1950 Today's Date: 07/31/2018 Time: 6812-7517 SLP Time Calculation (min) (ACUTE ONLY): 25 min  Assessment / Plan / Recommendation Clinical Impression  Patient sitting in chair just having finished lunch. She reports not having any swallowing problems. Trials of water both single sips and sequencial sips given with no clearing of throat or coughing. No changes noted in respiration or vocal quality. Patient has very few teeth in poor condition. Recommend Dys 3 diet due to coordination of breathing and poor dentition but upgrade liquids to thin liquids. Medications to be given whole in puree.    HPI HPI: Pt is a 67 year old female with history significant for memory loss, PTSD, COPD, schizophrenia, depression, diastolic heart failure, HTN, HLD, DMT2, CKD stage IV, asthma, chronic anemia, chronic pain, GERD, barrett's esophagus, hypothyroid, OSA on CPAP who presented to [facility] from [facility] for AMS. ETT 12/12-12/13. MRI negative for acute infarct. Pt had prior BSE in March 2017 with functional appearing oropharyngeal swallow.                 Recommendations  Diet recommendations: Dysphagia 3 (mechanical soft);Thin liquid Liquids provided via: Cup;No straw Medication Administration: Whole meds with puree Supervision: Patient able to self feed Compensations: Minimize environmental distractions;Slow rate;Small sips/bites Postural Changes and/or Swallow Maneuvers: Seated upright 90 degrees;Upright 30-60 min after meal                Oral Care Recommendations: Oral care BID Follow up Recommendations: Skilled Nursing facility SLP Visit Diagnosis: Dysphagia, unspecified (R13.10)       Parke, MA, CCC-SLP 07/31/2018 1:51 PM

## 2018-07-31 NOTE — Discharge Summary (Addendum)
PATIENT DETAILS Name: [name] Age: 67 y.o. Sex: female Date of Birth: [birth year] MRN: [id number]. Admitting Physician: Renee Pain, MD [facility]:QPRF, Costella Hatcher, MD  Admit Date: 07/27/2018 Discharge date: 07/31/2018  Recommendations for Outpatient Follow-up:  1. Follow up with PCP in 1-2 weeks 2. Please obtain BMP/CBC in one week 3. Please ensure outpatient follow-up with psychiatry  Admitted From:  SNF  Disposition: ALF   Home Health: yes  Equipment/Devices: None  Discharge Condition: Stable  CODE STATUS: FULL CODE  Diet recommendation:  Heart Healthy / Carb Modified -Dys 3 diet-see below  Brief Summary: See [name], Labs, Consult and Test reports for all details in brief, Patient is a 67 y.o. female resident of a local SNF-with prior history of chronic diastolic heart failure, DM-2, hypertension, CKD stage IV, OSA on CPAP-presented to the ED on 12/12 with acute encephalopathy and weakness, patient was intubated in the emergency room for airway protection and admitted to the ICU.  Upon stability-patient was transferred to the triad hospitalist service on 12/15.  Brief Hospital Course: Acute encephalopathy:  [name] has been completely awake and alert ever since she was transferred to the hospitalist service on 12/15  Unclear etiology at this time-no evidence of CVA on imaging study, EEG without any activity.  Some concern for polypharmacy/toxic encephalopathy or a catatonic state with a schizophrenia.  Pharmacy reviewed medication list-no apparent change in medications per last note from PCCM.  Concern with Risperdal may have caused encephalopathy on presentation-Risperdal was discontinued-psychiatry was consulted-recommendations were to transition to Haldol.  Since she remains stable-and back to her baseline-suspect she could go to her skilled nursing facility for further care and monitoring.   Acute hypoxic respiratory failure: Intubated on admission-  successfully extubated on 12/13.  Improved-appears to be on room air this morning.  Hypertension:  Relatively stable-continue Coreg, Demadex and Aldactone on discharge-we will add amlodipine as well.    Stage IV CKD: Creatinine close to usual baseline.  Follow.  Chronic diastolic heart failure: Reasonably well compensated-continue Coreg and Demadex.  Dyslipidemia: Continue statin  DM-2: CBG stable-but on the higher side-in spite of 10 units of Lantus that was started yesterday-will resume usual home regimen of insulin and resume all her usual oral hypoglycemic agents on discharge.    Hypothyroidism: Continue Synthroid  Schizophrenia: Apparently has a longstanding history of schizophrenia since she was 67 years old.  Risperidone was held due to concerns that it may have caused encephalopathy.  Has been restarted on Cymbalta and Neurontin which she seems to be tolerating well.    Psychiatry was consulted-given that neurology questioned whether she had a catatonic state-and concern for encephalopathy related to risperidone-psych suggested that we try Haldol which has been started.  Patient will need to follow-up with her primary psychiatrist in 1 week for further care.  Dysphagia: Initially kept n.p.o. postextubation-but upon follow-up by speech therapy-has been started on a dysphagia 3 diet.  Per nursing staff she has been diet tolerating well.   Procedures/Studies: 12/12 ETT >>12/13  Discharge Diagnoses:  Principal Problem:   Schizophrenia ([place]) Active Problems:   Type 2 diabetes mellitus with diabetic polyneuropathy, without long-term current use of insulin (HCC)   Chronic diastolic heart failure (HCC)   CKD (chronic kidney disease), stage IV (HCC)   Acute encephalopathy   Endotracheally intubated   Hypothyroidism   Discharge Instructions:  Activity:  As tolerated with Full fall precautions use walker/cane & assistance as needed  Discharge Instructions    Diet - low  sodium heart healthy   Complete by:  As directed     Diet recommendations: Dysphagia 3 (mechanical soft);Thin liquid Liquids provided via: Cup;No straw Medication Administration: Whole meds with puree Supervision: Patient able to self feed Compensations: Minimize environmental distractions;Slow rate;Small sips/bites Postural Changes and/or Swallow Maneuvers: Seated upright 90 degrees;Upright 30-60 min after meal   Diet Carb Modified   Complete by:  As directed    Discharge instructions   Complete by:  As directed    Follow with Primary MD  Glenda Chroman, MD in 1 week  Follow with your primary psychiatrist in 1 week  Please get a complete blood count and chemistry panel checked by your Primary MD at your next visit, and again as instructed by your Primary MD.  Get Medicines reviewed and adjusted: Please take all your medications with you for your next visit with your Primary MD  Laboratory/radiological data: Please request your Primary MD to go over all hospital tests and procedure/radiological results at the follow up, please ask your Primary MD to get all Hospital records sent to his/her office.  In some cases, they will be blood work, cultures and biopsy results pending at the time of your discharge. Please request that your primary care M.D. follows up on these results.  Also Note the following: If you experience worsening of your admission symptoms, develop shortness of breath, life threatening emergency, suicidal or homicidal thoughts you must seek medical attention immediately by calling 911 or calling your MD immediately  if symptoms less severe.  You must read complete instructions/literature along with all the possible adverse reactions/side effects for all the Medicines you take and that have been prescribed to you. Take any new Medicines after you have completely understood and accpet all the possible adverse reactions/side effects.   Do not drive when taking Pain  medications or sleeping medications (Benzodaizepines)  Do not take more than prescribed Pain, Sleep and Anxiety Medications. It is not advisable to combine anxiety,sleep and pain medications without talking with your primary care practitioner  Special Instructions: If you have smoked or chewed Tobacco  in the last 2 yrs please stop smoking, stop any regular Alcohol  and or any Recreational drug use.  Wear Seat belts while driving.  Please note: You were cared for by a hospitalist during your hospital stay. Once you are discharged, your primary care physician will handle any further medical issues. Please note that NO REFILLS for any discharge medications will be authorized once you are discharged, as it is imperative that you return to your primary care physician (or establish a relationship with a primary care physician if you do not have one) for your post hospital discharge needs so that they can reassess your need for medications and monitor your lab values.   Increase activity slowly   Complete by:  As directed    Check CBGs before meals and at bedtime     Allergies as of 07/31/2018      Reactions   Benztropine Mesylate Other (See Comments)   Alopecia and white spots on eyes   Codeine Swelling, Palpitations, Other (See Comments)   Mouth Swelling and Tachycardia   Diazepam Other (See Comments)   COMA   Diphenhydramine Hcl Anxiety, Palpitations, Other (See Comments)   Tachycardia and anxiousness   Penicillins Other (See Comments)   REACTION: ulcers in mouth, swelling, throat swelling  Has patient had a PCN reaction causing immediate rash, facial/tongue/throat swelling, SOB or lightheadedness with hypotension: YES Has patient  had a PCN reaction causing severe rash involving Mucus membranes or skin necrosis: Unknown Has patient had a PCN reaction that required hospitalization Unknown Has patient had a PCN reaction occurring within the last 10 years: unknown If all of the above answers  are "NO", then may proceed with Cephalospori   Sulfonamide Derivatives Rash   Blisters, also   Thioridazine Hcl Swelling   Benztropine Other (See Comments)   "Alopecia and white spots on eyes"   Penicillin G Swelling, Other (See Comments)   Ulcers in mouth & tongue became swollen Has patient had a PCN reaction causing immediate rash, facial/tongue/throat swelling, SOB or lightheadedness with hypotension: Yes Has patient had a PCN reaction causing severe rash involving mucus membranes or skin necrosis: Unk Has patient had a PCN reaction that required hospitalization: Unk Has patient had a PCN reaction occurring within the last 10 years: Unk If all of the above answers are "NO", then may proceed with Cephalosporin use.   Lisinopril Cough   Sulfamethoxazole Rash      Medication List    STOP taking these medications   doxycycline 100 MG capsule Commonly known as:  VIBRAMYCIN   hydrOXYzine 25 MG capsule Commonly known as:  VISTARIL   LORazepam 1 MG tablet Commonly known as:  ATIVAN   losartan 100 MG tablet Commonly known as:  COZAAR   prazosin 2 MG capsule Commonly known as:  MINIPRESS   risperiDONE 0.5 MG tablet Commonly known as:  RISPERDAL   traZODone 100 MG tablet Commonly known as:  DESYREL     TAKE these medications   acetaminophen 325 MG tablet Commonly known as:  TYLENOL Take 325 mg by mouth 2 (two) times daily.   albuterol 108 (90 Base) MCG/ACT inhaler Commonly known as:  PROVENTIL HFA;VENTOLIN HFA Inhale 2 puffs into the lungs every 6 (six) hours as needed for wheezing or shortness of breath.   albuterol (2.5 MG/3ML) 0.083% nebulizer solution Commonly known as:  PROVENTIL Take 2.5 mg by nebulization every 6 (six) hours as needed for wheezing.   amLODipine 5 MG tablet Commonly known as:  NORVASC Take 1 tablet (5 mg total) by mouth daily.   aspirin 81 MG tablet Take 81 mg by mouth daily.   atorvastatin 40 MG tablet Commonly known as:  LIPITOR Take 40  mg by mouth at bedtime.   budesonide-formoterol 160-4.5 MCG/ACT inhaler Commonly known as:  SYMBICORT Inhale 2 puffs into the lungs 2 (two) times daily.   carvedilol 25 MG tablet Commonly known as:  COREG Take 25 mg by mouth 2 (two) times daily with a meal.   DAILY VITE PO Take 1 tablet by mouth daily.   docusate sodium 100 MG capsule Commonly known as:  COLACE Take 100 mg by mouth daily.   DULoxetine 60 MG capsule Commonly known as:  CYMBALTA Take 60 mg by mouth daily.   EPINEPHrine 0.3 mg/0.3 mL Devi Commonly known as:  EPI-PEN Inject 0.3 mg into the muscle once as needed (for bee stings, then go to the ED immediately after using).   FLEET LIQUID GLYCERIN SUPP RE Place 1 suppository rectally daily as needed (for constipation).   gabapentin 300 MG capsule Commonly known as:  NEURONTIN Take 300-600 mg by mouth See admin instructions. Take 300 mg by mouth two times a day and 600 mg at bedtime   glipiZIDE 5 MG 24 hr tablet Commonly known as:  GLUCOTROL XL Take 5 mg by mouth 2 (two) times daily.   haloperidol 0.5  MG tablet Commonly known as:  HALDOL Take 1 tablet (0.5 mg total) by mouth 2 (two) times daily.   hydrocortisone 2.5 % ointment Apply 1 application topically daily as needed (itchy skin).   levothyroxine 25 MCG tablet Commonly known as:  SYNTHROID, LEVOTHROID Take 25 mcg by mouth daily before breakfast.   linagliptin 5 MG Tabs tablet Commonly known as:  TRADJENTA Take 5 mg by mouth daily.   loperamide 2 MG capsule Commonly known as:  IMODIUM Take 2 mg by mouth 3 (three) times daily as needed (for diarrhea).   montelukast 10 MG tablet Commonly known as:  SINGULAIR Take 10 mg by mouth at bedtime.   omeprazole 20 MG capsule Commonly known as:  PRILOSEC Take 20 mg by mouth daily.   polyethylene glycol powder powder Commonly known as:  GLYCOLAX/MIRALAX Take 17 g by mouth daily as needed (for constipation).   pyridOXINE 100 MG tablet Commonly known  as:  VITAMIN B-6 Take 200 mg by mouth 2 (two) times daily.   spironolactone 25 MG tablet Commonly known as:  ALDACTONE Take 1 tablet (25 mg total) by mouth daily.   torsemide 20 MG tablet Commonly known as:  DEMADEX Take 60 mg alternating with 40 mg daily. What changed:    how much to take  how to take this  when to take this  additional instructions   TRESIBA FLEXTOUCH 100 UNIT/ML Sopn FlexTouch Pen Generic drug:  insulin degludec Inject 20 Units into the skin at bedtime.   TRULICITY 1.5 WF/0.9NA Sopn Generic drug:  Dulaglutide Inject 1.5 mg into the skin every Friday.   Vitamin D3 50 MCG (2000 UT) Tabs Take 4,000 Units by mouth daily.       Allergies  Allergen Reactions  . Benztropine Mesylate Other (See Comments)    Alopecia and white spots on eyes  . Codeine Swelling, Palpitations and Other (See Comments)    Mouth Swelling and Tachycardia   . Diazepam Other (See Comments)    COMA  . Diphenhydramine Hcl Anxiety, Palpitations and Other (See Comments)    Tachycardia and anxiousness  . Penicillins Other (See Comments)    REACTION: ulcers in mouth, swelling, throat swelling  Has patient had a PCN reaction causing immediate rash, facial/tongue/throat swelling, SOB or lightheadedness with hypotension: YES Has patient had a PCN reaction causing severe rash involving Mucus membranes or skin necrosis: Unknown Has patient had a PCN reaction that required hospitalization Unknown Has patient had a PCN reaction occurring within the last 10 years: unknown If all of the above answers are "NO", then may proceed with Cephalospori  . Sulfonamide Derivatives Rash    Blisters, also  . Thioridazine Hcl Swelling  . Benztropine Other (See Comments)    "Alopecia and white spots on eyes"  . Penicillin G Swelling and Other (See Comments)    Ulcers in mouth & tongue became swollen Has patient had a PCN reaction causing immediate rash, facial/tongue/throat swelling, SOB or  lightheadedness with hypotension: Yes Has patient had a PCN reaction causing severe rash involving mucus membranes or skin necrosis: Unk Has patient had a PCN reaction that required hospitalization: Unk Has patient had a PCN reaction occurring within the last 10 years: Unk If all of the above answers are "NO", then may proceed with Cephalosporin use.   [name] Kitchen Lisinopril Cough  . Sulfamethoxazole Rash    Consultations:   pulmonary/intensive care and psychiatry  Neurology   Other Procedures/Studies: Mr Virgel Paling TF Contrast  Result Date: 07/27/2018 CLINICAL  DATA:  Acute onset somnolence.  Altered mental status. EXAM: MRI HEAD WITHOUT CONTRAST MRA HEAD WITHOUT CONTRAST TECHNIQUE: Multiplanar, multiecho pulse sequences of the brain and surrounding structures were obtained without intravenous contrast. Angiographic images of the head were obtained using MRA technique without contrast. COMPARISON:  Head CT 07/27/2018 Brain MRI 04/19/2018 FINDINGS: An abbreviated protocol was performed. Sagittal T1, axial T2, axial T1 and coronal T2-weighted imaging was not obtained. MRI HEAD FINDINGS BRAIN: There is no acute infarct, acute hemorrhage or mass effect. The midline structures are normal. There are no old infarcts. Multifocal white matter hyperintensity, most commonly due to chronic ischemic microangiopathy. Mild generalized atrophy. SKULL AND UPPER CERVICAL SPINE: The visualized skull base, calvarium, upper cervical spine and extracranial soft tissues are normal. SINUSES/ORBITS: No fluid levels or advanced mucosal thickening. No mastoid or middle ear effusion. The orbits are normal. MRA HEAD FINDINGS Intracranial internal carotid arteries: Normal. Anterior cerebral arteries: Normal. Middle cerebral arteries: Normal. Posterior communicating arteries: Present on the right only. Posterior cerebral arteries: Normal. Basilar artery: Normal. Vertebral arteries: Left dominant. Normal. Superior cerebellar arteries:  Normal. Inferior cerebellar arteries: Normal. IMPRESSION: 1. No acute intracranial abnormality. 2. Findings of chronic microvascular ischemia. 3. Normal intracranial MRA. Electronically Signed   By: Ulyses Jarred M.D.   On: 07/27/2018 19:56   Mr Brain Wo Contrast  Result Date: 07/27/2018 CLINICAL DATA:  Acute onset somnolence.  Altered mental status. EXAM: MRI HEAD WITHOUT CONTRAST MRA HEAD WITHOUT CONTRAST TECHNIQUE: Multiplanar, multiecho pulse sequences of the brain and surrounding structures were obtained without intravenous contrast. Angiographic images of the head were obtained using MRA technique without contrast. COMPARISON:  Head CT 07/27/2018 Brain MRI 04/19/2018 FINDINGS: An abbreviated protocol was performed. Sagittal T1, axial T2, axial T1 and coronal T2-weighted imaging was not obtained. MRI HEAD FINDINGS BRAIN: There is no acute infarct, acute hemorrhage or mass effect. The midline structures are normal. There are no old infarcts. Multifocal white matter hyperintensity, most commonly due to chronic ischemic microangiopathy. Mild generalized atrophy. SKULL AND UPPER CERVICAL SPINE: The visualized skull base, calvarium, upper cervical spine and extracranial soft tissues are normal. SINUSES/ORBITS: No fluid levels or advanced mucosal thickening. No mastoid or middle ear effusion. The orbits are normal. MRA HEAD FINDINGS Intracranial internal carotid arteries: Normal. Anterior cerebral arteries: Normal. Middle cerebral arteries: Normal. Posterior communicating arteries: Present on the right only. Posterior cerebral arteries: Normal. Basilar artery: Normal. Vertebral arteries: Left dominant. Normal. Superior cerebellar arteries: Normal. Inferior cerebellar arteries: Normal. IMPRESSION: 1. No acute intracranial abnormality. 2. Findings of chronic microvascular ischemia. 3. Normal intracranial MRA. Electronically Signed   By: Ulyses Jarred M.D.   On: 07/27/2018 19:56   Nm Pulmonary Perf And  Vent  Result Date: 07/15/2018 CLINICAL DATA:  Intermediate probability for pulmonary embolus. Positive D-dimer. EXAM: NUCLEAR MEDICINE VENTILATION - PERFUSION LUNG SCAN TECHNIQUE: Ventilation images were obtained in multiple projections using inhaled aerosol Tc-67m DTPA. Perfusion images were obtained in multiple projections after intravenous injection of Tc-72m MAA. RADIOPHARMACEUTICALS:  30.1 mCi of Tc-65m DTPA aerosol inhalation and 4 mCi Tc7m MAA IV COMPARISON:  None. FINDINGS: Ventilation: There is a clumping of the radiopharmaceutical within the central airways compatible with turbulent air flow secondary to COPD. No wedge-shaped ventilation abnormalities identified. Perfusion: No wedge shaped peripheral perfusion defects to suggest acute pulmonary embolism. IMPRESSION: 1. No evidence for pulmonary emboli.  Very low probability. 2. COPD. Electronically Signed   By: Kerby Moors M.D.   On: 07/15/2018 10:17   US Venous Img  Lower Bilateral  Result Date: 07/15/2018 CLINICAL DATA:  Elevated D-dimer EXAM: BILATERAL LOWER EXTREMITY VENOUS DUPLEX ULTRASOUND TECHNIQUE: Doppler venous assessment of the bilateral lower extremity deep venous system was performed, including characterization of spectral flow, compressibility, and plasticity. COMPARISON:  None. FINDINGS: There is complete compressibility of the bilateral common femoral, femoral, and popliteal veins. Doppler analysis demonstrates respiratory phasicity and augmentation of flow with calf compression. No obvious superficial vein or calf vein thrombosis. IMPRESSION: No evidence of lower extremity DVT Electronically Signed   By: Marybelle Killings M.D.   On: 07/15/2018 10:33   Dg Chest Port 1 View  Result Date: 07/28/2018 CLINICAL DATA:  Hypoxia EXAM: PORTABLE CHEST 1 VIEW COMPARISON:  July 27, 2018 FINDINGS: Endotracheal tube tip is 2.2 cm above the carina. Nasogastric tube tip and side port are below the diaphragm. No pneumothorax. There is  atelectatic change in the lung bases. The lungs elsewhere are clear. Heart is mildly enlarged with pulmonary vascularity normal. No adenopathy. No appreciable bone lesions. IMPRESSION: Tube positions as described without pneumothorax. Bibasilar atelectasis. No frank airspace consolidation. Stable cardiac prominence. Electronically Signed   By: Lowella Grip III M.D.   On: 07/28/2018 07:22   Dg Chest Portable 1 View  Result Date: 07/27/2018 CLINICAL DATA:  Tube placement. EXAM: PORTABLE CHEST 1 VIEW COMPARISON:  07/14/2018. FINDINGS: Endotracheal tube tip noted approximately 7 mm above the lower portion of the carina. Proximal repositioning of approximately 2 cm should be considered. NG tube noted with tip below left hemidiaphragm. Cardiomegaly with pulmonary venous congestion bilateral interstitial prominence. Findings consistent with CHF. No pneumothorax. IMPRESSION: 1. Endotracheal tube noted with its tip approximately 7 mm above the lower portion of the carina. Proximal repositioning of approximately 2 cm should be considered. NG tube noted with tip below left hemidiaphragm. 2. Cardiomegaly with pulmonary venous congestion bilateral interstitial prominence consistent with CHF. Electronically Signed   By: Marcello Moores  Register   On: 07/27/2018 16:23   Dg Chest Port 1 View  Result Date: 07/14/2018 CLINICAL DATA:  Chest pain and shortness of breath EXAM: PORTABLE CHEST 1 VIEW COMPARISON:  04/18/2018 FINDINGS: There is shallow lung inflation with pulmonary vascular congestion. Mild cardiomegaly. No pleural effusion or pneumothorax. No focal airspace consolidation. IMPRESSION: Cardiomegaly and pulmonary vascular congestion. Electronically Signed   By: Ulyses Jarred M.D.   On: 07/14/2018 21:19   Ct Head Code Stroke Wo Contrast  Result Date: 07/27/2018 CLINICAL DATA:  Code stroke.  Altered mental status EXAM: CT HEAD WITHOUT CONTRAST TECHNIQUE: Contiguous axial images were obtained from the base of the  skull through the vertex without intravenous contrast. COMPARISON:  Head CT 04/18/2018 FINDINGS: Brain: There is no mass, hemorrhage or extra-axial collection. The size and configuration of the ventricles and extra-axial CSF spaces are normal. There is a prominent perivascular space of the left lentiform nucleus, unchanged. Mild white matter hypoattenuation compatible with chronic small vessel disease. Vascular: No abnormal hyperdensity of the major intracranial arteries or dural venous sinuses. No intracranial atherosclerosis. Skull: The visualized skull base, calvarium and extracranial soft tissues are normal. Sinuses/Orbits: No fluid levels or advanced mucosal thickening of the visualized paranasal sinuses. No mastoid or middle ear effusion. The orbits are normal. ASPECTS [facility] Stroke Program Early CT Score) - Ganglionic level infarction (caudate, lentiform nuclei, internal capsule, insula, M1-M3 cortex): 7 - Supraganglionic infarction (M4-M6 cortex): 3 Total score (0-10 with 10 being normal): 10 IMPRESSION: 1. No hemorrhage or other acute abnormality. 2. ASPECTS is 10. These results were called by  telephone at the time of interpretation on 07/27/2018 at 3:07 pm to Dr. Nanda Quinton , who verbally acknowledged these results. Electronically Signed   By: Ulyses Jarred M.D.   On: 07/27/2018 15:08     TODAY-DAY OF DISCHARGE:  Subjective:   [name] today has no headache,no chest abdominal pain,no new weakness tingling or numbness, feels much better wants to go home today.   Objective:   Blood pressure (!) 141/74, pulse 74, temperature 97.7 F (36.5 C), temperature source Oral, resp. rate 20, height 5\' 3"  (1.6 m), weight 97.5 kg, SpO2 100 %.  Intake/Output Summary (Last 24 hours) at 07/31/2018 1415 Last data filed at 07/31/2018 1100 Gross per 24 hour  Intake -  Output 1850 ml  Net -1850 ml   Filed Weights   07/27/18 1507 07/28/18 0451 07/30/18 0454  Weight: 102 kg 99.4 kg 97.5 kg     Exam: Awake Alert, Oriented *3, No new F.N deficits, Normal affect [place].AT,PERRAL Supple Neck,No JVD, No cervical lymphadenopathy appriciated.  Symmetrical Chest wall movement, Good air movement bilaterally, CTAB RRR,No Gallops,Rubs or new Murmurs, No Parasternal Heave +ve B.Sounds, Abd Soft, Non tender, No organomegaly appriciated, No rebound -guarding or rigidity. No Cyanosis, Clubbing or edema, No new Rash or bruise   PERTINENT RADIOLOGIC STUDIES: Mr Virgel Paling FX Contrast  Result Date: 07/27/2018 CLINICAL DATA:  Acute onset somnolence.  Altered mental status. EXAM: MRI HEAD WITHOUT CONTRAST MRA HEAD WITHOUT CONTRAST TECHNIQUE: Multiplanar, multiecho pulse sequences of the brain and surrounding structures were obtained without intravenous contrast. Angiographic images of the head were obtained using MRA technique without contrast. COMPARISON:  Head CT 07/27/2018 Brain MRI 04/19/2018 FINDINGS: An abbreviated protocol was performed. Sagittal T1, axial T2, axial T1 and coronal T2-weighted imaging was not obtained. MRI HEAD FINDINGS BRAIN: There is no acute infarct, acute hemorrhage or mass effect. The midline structures are normal. There are no old infarcts. Multifocal white matter hyperintensity, most commonly due to chronic ischemic microangiopathy. Mild generalized atrophy. SKULL AND UPPER CERVICAL SPINE: The visualized skull base, calvarium, upper cervical spine and extracranial soft tissues are normal. SINUSES/ORBITS: No fluid levels or advanced mucosal thickening. No mastoid or middle ear effusion. The orbits are normal. MRA HEAD FINDINGS Intracranial internal carotid arteries: Normal. Anterior cerebral arteries: Normal. Middle cerebral arteries: Normal. Posterior communicating arteries: Present on the right only. Posterior cerebral arteries: Normal. Basilar artery: Normal. Vertebral arteries: Left dominant. Normal. Superior cerebellar arteries: Normal. Inferior cerebellar arteries: Normal.  IMPRESSION: 1. No acute intracranial abnormality. 2. Findings of chronic microvascular ischemia. 3. Normal intracranial MRA. Electronically Signed   By: Ulyses Jarred M.D.   On: 07/27/2018 19:56   Mr Brain Wo Contrast  Result Date: 07/27/2018 CLINICAL DATA:  Acute onset somnolence.  Altered mental status. EXAM: MRI HEAD WITHOUT CONTRAST MRA HEAD WITHOUT CONTRAST TECHNIQUE: Multiplanar, multiecho pulse sequences of the brain and surrounding structures were obtained without intravenous contrast. Angiographic images of the head were obtained using MRA technique without contrast. COMPARISON:  Head CT 07/27/2018 Brain MRI 04/19/2018 FINDINGS: An abbreviated protocol was performed. Sagittal T1, axial T2, axial T1 and coronal T2-weighted imaging was not obtained. MRI HEAD FINDINGS BRAIN: There is no acute infarct, acute hemorrhage or mass effect. The midline structures are normal. There are no old infarcts. Multifocal white matter hyperintensity, most commonly due to chronic ischemic microangiopathy. Mild generalized atrophy. SKULL AND UPPER CERVICAL SPINE: The visualized skull base, calvarium, upper cervical spine and extracranial soft tissues are normal. SINUSES/ORBITS: No fluid levels or  advanced mucosal thickening. No mastoid or middle ear effusion. The orbits are normal. MRA HEAD FINDINGS Intracranial internal carotid arteries: Normal. Anterior cerebral arteries: Normal. Middle cerebral arteries: Normal. Posterior communicating arteries: Present on the right only. Posterior cerebral arteries: Normal. Basilar artery: Normal. Vertebral arteries: Left dominant. Normal. Superior cerebellar arteries: Normal. Inferior cerebellar arteries: Normal. IMPRESSION: 1. No acute intracranial abnormality. 2. Findings of chronic microvascular ischemia. 3. Normal intracranial MRA. Electronically Signed   By: Ulyses Jarred M.D.   On: 07/27/2018 19:56   Nm Pulmonary Perf And Vent  Result Date: 07/15/2018 CLINICAL DATA:   Intermediate probability for pulmonary embolus. Positive D-dimer. EXAM: NUCLEAR MEDICINE VENTILATION - PERFUSION LUNG SCAN TECHNIQUE: Ventilation images were obtained in multiple projections using inhaled aerosol Tc-5m DTPA. Perfusion images were obtained in multiple projections after intravenous injection of Tc-30m MAA. RADIOPHARMACEUTICALS:  30.1 mCi of Tc-35m DTPA aerosol inhalation and 4 mCi Tc33m MAA IV COMPARISON:  None. FINDINGS: Ventilation: There is a clumping of the radiopharmaceutical within the central airways compatible with turbulent air flow secondary to COPD. No wedge-shaped ventilation abnormalities identified. Perfusion: No wedge shaped peripheral perfusion defects to suggest acute pulmonary embolism. IMPRESSION: 1. No evidence for pulmonary emboli.  Very low probability. 2. COPD. Electronically Signed   By: Kerby Moors M.D.   On: 07/15/2018 10:17   US Venous Img Lower Bilateral  Result Date: 07/15/2018 CLINICAL DATA:  Elevated D-dimer EXAM: BILATERAL LOWER EXTREMITY VENOUS DUPLEX ULTRASOUND TECHNIQUE: Doppler venous assessment of the bilateral lower extremity deep venous system was performed, including characterization of spectral flow, compressibility, and plasticity. COMPARISON:  None. FINDINGS: There is complete compressibility of the bilateral common femoral, femoral, and popliteal veins. Doppler analysis demonstrates respiratory phasicity and augmentation of flow with calf compression. No obvious superficial vein or calf vein thrombosis. IMPRESSION: No evidence of lower extremity DVT Electronically Signed   By: Marybelle Killings M.D.   On: 07/15/2018 10:33   Dg Chest Port 1 View  Result Date: 07/28/2018 CLINICAL DATA:  Hypoxia EXAM: PORTABLE CHEST 1 VIEW COMPARISON:  July 27, 2018 FINDINGS: Endotracheal tube tip is 2.2 cm above the carina. Nasogastric tube tip and side port are below the diaphragm. No pneumothorax. There is atelectatic change in the lung bases. The lungs  elsewhere are clear. Heart is mildly enlarged with pulmonary vascularity normal. No adenopathy. No appreciable bone lesions. IMPRESSION: Tube positions as described without pneumothorax. Bibasilar atelectasis. No frank airspace consolidation. Stable cardiac prominence. Electronically Signed   By: Lowella Grip III M.D.   On: 07/28/2018 07:22   Dg Chest Portable 1 View  Result Date: 07/27/2018 CLINICAL DATA:  Tube placement. EXAM: PORTABLE CHEST 1 VIEW COMPARISON:  07/14/2018. FINDINGS: Endotracheal tube tip noted approximately 7 mm above the lower portion of the carina. Proximal repositioning of approximately 2 cm should be considered. NG tube noted with tip below left hemidiaphragm. Cardiomegaly with pulmonary venous congestion bilateral interstitial prominence. Findings consistent with CHF. No pneumothorax. IMPRESSION: 1. Endotracheal tube noted with its tip approximately 7 mm above the lower portion of the carina. Proximal repositioning of approximately 2 cm should be considered. NG tube noted with tip below left hemidiaphragm. 2. Cardiomegaly with pulmonary venous congestion bilateral interstitial prominence consistent with CHF. Electronically Signed   By: Marcello Moores  Register   On: 07/27/2018 16:23   Dg Chest Port 1 View  Result Date: 07/14/2018 CLINICAL DATA:  Chest pain and shortness of breath EXAM: PORTABLE CHEST 1 VIEW COMPARISON:  04/18/2018 FINDINGS: There is shallow lung inflation with  pulmonary vascular congestion. Mild cardiomegaly. No pleural effusion or pneumothorax. No focal airspace consolidation. IMPRESSION: Cardiomegaly and pulmonary vascular congestion. Electronically Signed   By: Ulyses Jarred M.D.   On: 07/14/2018 21:19   Ct Head Code Stroke Wo Contrast  Result Date: 07/27/2018 CLINICAL DATA:  Code stroke.  Altered mental status EXAM: CT HEAD WITHOUT CONTRAST TECHNIQUE: Contiguous axial images were obtained from the base of the skull through the vertex without intravenous  contrast. COMPARISON:  Head CT 04/18/2018 FINDINGS: Brain: There is no mass, hemorrhage or extra-axial collection. The size and configuration of the ventricles and extra-axial CSF spaces are normal. There is a prominent perivascular space of the left lentiform nucleus, unchanged. Mild white matter hypoattenuation compatible with chronic small vessel disease. Vascular: No abnormal hyperdensity of the major intracranial arteries or dural venous sinuses. No intracranial atherosclerosis. Skull: The visualized skull base, calvarium and extracranial soft tissues are normal. Sinuses/Orbits: No fluid levels or advanced mucosal thickening of the visualized paranasal sinuses. No mastoid or middle ear effusion. The orbits are normal. ASPECTS [facility] Stroke Program Early CT Score) - Ganglionic level infarction (caudate, lentiform nuclei, internal capsule, insula, M1-M3 cortex): 7 - Supraganglionic infarction (M4-M6 cortex): 3 Total score (0-10 with 10 being normal): 10 IMPRESSION: 1. No hemorrhage or other acute abnormality. 2. ASPECTS is 10. These results were called by telephone at the time of interpretation on 07/27/2018 at 3:07 pm to Dr. Nanda Quinton , who verbally acknowledged these results. Electronically Signed   By: Ulyses Jarred M.D.   On: 07/27/2018 15:08     PERTINENT LAB RESULTS: CBC: Recent Labs    07/29/18 0213 07/30/18 0526  WBC 7.1 7.7  HGB 8.4* 8.2*  HCT 26.5* 26.0*  PLT 182 165   CMET CMP     Component Value Date/Time   NA 142 07/30/2018 0526   K 4.4 07/30/2018 0526   CL 107 07/30/2018 0526   CO2 24 07/30/2018 0526   GLUCOSE 252 (H) 07/30/2018 0526   BUN 44 (H) 07/30/2018 0526   CREATININE 2.50 (H) 07/30/2018 0526   CALCIUM 8.6 (L) 07/30/2018 0526   PROT 6.8 07/27/2018 1500   ALBUMIN 3.2 (L) 07/27/2018 1500   AST 20 07/27/2018 1500   ALT 28 07/27/2018 1500   ALKPHOS 89 07/27/2018 1500   BILITOT 0.3 07/27/2018 1500   GFRNONAA 19 (L) 07/30/2018 0526   GFRAA 22 (L) 07/30/2018  0526    GFR Estimated Creatinine Clearance: 24.6 mL/min (A) (by C-G formula based on SCr of 2.5 mg/dL (H)). No results for input(s): LIPASE, AMYLASE in the last 72 hours. No results for input(s): CKTOTAL, CKMB, CKMBINDEX, TROPONINI in the last 72 hours. Invalid input(s): POCBNP No results for input(s): DDIMER in the last 72 hours. No results for input(s): HGBA1C in the last 72 hours. No results for input(s): CHOL, HDL, LDLCALC, TRIG, CHOLHDL, LDLDIRECT in the last 72 hours. No results for input(s): TSH, T4TOTAL, T3FREE, THYROIDAB in the last 72 hours.  Invalid input(s): FREET3 No results for input(s): VITAMINB12, FOLATE, FERRITIN, TIBC, IRON, RETICCTPCT in the last 72 hours. Coags: No results for input(s): INR in the last 72 hours.  Invalid input(s): PT Microbiology: Recent Results (from the past 240 hour(s))  MRSA PCR Screening     Status: None   Collection Time: 07/27/18 11:56 PM  Result Value Ref Range Status   MRSA by PCR NEGATIVE NEGATIVE Final    Comment:        The GeneXpert MRSA Assay (FDA approved for  NASAL specimens only), is one component of a comprehensive MRSA colonization surveillance program. It is not intended to diagnose MRSA infection nor to guide or monitor treatment for MRSA infections. Performed at [facility], [place] [address], [place], [place] [address]     FURTHER DISCHARGE INSTRUCTIONS:  Get Medicines reviewed and adjusted: Please take all your medications with you for your next visit with your Primary MD  Laboratory/radiological data: Please request your Primary MD to go over all hospital tests and procedure/radiological results at the follow up, please ask your Primary MD to get all Hospital records sent to his/her office.  In some cases, they will be blood work, cultures and biopsy results pending at the time of your discharge. Please request that your primary care M.D. goes through all the records of your hospital data and follows up on  these results.  Also Note the following: If you experience worsening of your admission symptoms, develop shortness of breath, life threatening emergency, suicidal or homicidal thoughts you must seek medical attention immediately by calling 911 or calling your MD immediately  if symptoms less severe.  You must read complete instructions/literature along with all the possible adverse reactions/side effects for all the Medicines you take and that have been prescribed to you. Take any new Medicines after you have completely understood and accpet all the possible adverse reactions/side effects.   Do not drive when taking Pain medications or sleeping medications (Benzodaizepines)  Do not take more than prescribed Pain, Sleep and Anxiety Medications. It is not advisable to combine anxiety,sleep and pain medications without talking with your primary care practitioner  Special Instructions: If you have smoked or chewed Tobacco  in the last 2 yrs please stop smoking, stop any regular Alcohol  and or any Recreational drug use.  Wear Seat belts while driving.  Please note: You were cared for by a hospitalist during your hospital stay. Once you are discharged, your primary care physician will handle any further medical issues. Please note that NO REFILLS for any discharge medications will be authorized once you are discharged, as it is imperative that you return to your primary care physician (or establish a relationship with a primary care physician if you do not have one) for your post hospital discharge needs so that they can reassess your need for medications and monitor your lab values.  Total Time spent coordinating discharge including counseling, education and face to face time equals 35 minutes.  Signed: Jury Caserta 07/31/2018 2:15 PM

## 2018-08-01 LAB — GLUCOSE, CAPILLARY
Glucose-Capillary: 151 mg/dL — ABNORMAL HIGH (ref 70–99)
Glucose-Capillary: 227 mg/dL — ABNORMAL HIGH (ref 70–99)

## 2018-08-01 NOTE — Progress Notes (Signed)
Patient will DC to: [facility]  Anticipated DC date: 08/01/18 Family notified: Facility, patient declined to have family notified Transport by: Corey Harold   Per MD patient ready for DC to [facility]. RN, patient, patient's family, and facility notified of DC. Discharge Summary and FL2 sent to facility. RN to call report prior to discharge [phone]). DC packet on chart. Ambulance transport requested for patient.   CSW will sign off for now as social work intervention is no longer needed. Please consult [place] again if new needs arise.  Cedric Fishman, LCSW Clinical Social Worker [phone]

## 2018-08-01 NOTE — Progress Notes (Signed)
Patient was seen and examined-she is completely awake and alert.  Seems to be tolerating Haldol without any issues.  Stable to be discharged to SNF today-please see discharge summary done on 12/16.

## 2018-08-01 NOTE — Progress Notes (Signed)
Patient given discharge instructions. A copy was given to EMS for the facility. IVs taken out without complications. Patient going back to Assisted living facility. Called facility and gave report.

## 2018-08-01 NOTE — NC FL2 (Addendum)
[place] MEDICAID FL2 LEVEL OF CARE SCREENING TOOL     IDENTIFICATION  Patient Name: [name] Birthdate: Oct 15, 1950 Sex: female Admission Date (Current Location): 07/27/2018  [facility] and [place] Number:  [employer] and Address:  [facility]. [facility], [place] [address] [place], [place] [address]      Provider Number: [id number]  Attending Physician Name and Address:  Jonetta Osgood, MD  Relative Name and Phone Number:  Mady Haagensen; niece; [phone]    Current Level of Care: Hospital Recommended Level of Care: [place] Prior Approval Number:    Date Approved/Denied:   PASRR Number:    Discharge Plan: Other (Comment)([facility])    Current Diagnoses: Patient Active Problem List   Diagnosis Date Noted  . Acute encephalopathy 07/27/2018  . Endotracheally intubated 07/27/2018  . Hypothyroidism 07/27/2018  . Pleuritic chest pain 07/14/2018  . Moderate persistent asthma without complication   . Type 2 diabetes mellitus ([place]) 04/19/2018  . Acute kidney injury superimposed on chronic kidney disease ([place]) 01/13/2018  . Altered mental status   . Encephalopathy 10/11/2016  . Chest pain 08/12/2016  . CKD (chronic kidney disease), stage IV ([place]) 08/12/2016  . History of pulmonary embolus (PE) 08/12/2016  . Obesity, Class III, BMI 40-49.9 (morbid obesity) ([place]) 08/12/2016  . RBBB 08/12/2016  . Positive D dimer 08/12/2016  . CVA (cerebral infarction) 11/10/2015  . Physical deconditioning 07/01/2011  . Weakness 07/01/2011  . Diabetic foot ulcer ([place]) 05/19/2011  . Polypharmacy 02/09/2011  . BACK PAIN WITH RADICULOPATHY 05/25/2010  . Type 2 diabetes mellitus with diabetic polyneuropathy, without long-term current use of insulin ([place]) 08/18/2009  . Chronic diastolic heart failure ([place]) 04/25/2009  . Normocytic anemia 04/14/2009  . Sleep apnea 12/11/2008  . Diabetic peripheral neuropathy ([place]) 05/04/2007  .  HYPERCHOLESTEROLEMIA 10/13/2006  . Gout, unspecified 10/13/2006  . HYPOKALEMIA 10/13/2006  . Schizophrenia ([place]) 10/13/2006  . Depression 10/13/2006  . Essential hypertension 10/13/2006  . Venous (peripheral) insufficiency 10/13/2006  . RHINITIS, ALLERGIC 10/13/2006  . Asthma 10/13/2006  . Reflux esophagitis 10/13/2006  . OSTEOARTHRITIS, MULTI SITES 10/13/2006  . INCONTINENCE, URGE 10/13/2006    Orientation RESPIRATION BLADDER Height & Weight     Self, Time, Situation, Place(pt has memory loss)  Normal Continent Weight: 97.5 kg Height:  5\' 3"  (160 cm)  BEHAVIORAL SYMPTOMS/MOOD NEUROLOGICAL BOWEL NUTRITION STATUS      Continent Reduced concentrated sweets, no added table salt  AMBULATORY STATUS COMMUNICATION OF NEEDS Skin   Limited Assist Verbally Other (Comment)(diabetic ulcer on foot)                       Personal Care Assistance Level of Assistance  Bathing, Feeding, Dressing Bathing Assistance: Limited assistance Feeding assistance: Independent Dressing Assistance: Limited assistance     Functional Limitations Info  Sight, Hearing, Speech Sight Info: Adequate Hearing Info: Adequate Speech Info: Adequate    SPECIAL CARE FACTORS FREQUENCY  PT (By licensed PT)     PT Frequency: 3x week through [facility]              Contractures      Additional Factors Info  Code Status, Allergies Code Status Info: Full Code Allergies Info: BENZTROPINE MESYLATE, CODEINE, DIAZEPAM, DIPHENHYDRAMINE HCL, PENICILLINS, SULFONAMIDE DERIVATIVES, THIORIDAZINE HCL, BENZTROPINE, PENICILLIN G, LISINOPRIL, SULFAMETHOXAZOLE            Current Medications (08/01/2018):  Discharge Medications: STOP taking these medications   doxycycline 100 MG capsule Commonly  known as:  VIBRAMYCIN   hydrOXYzine 25 MG capsule Commonly known as:  VISTARIL   LORazepam 1 MG tablet Commonly known as:  ATIVAN   losartan 100 MG tablet Commonly known as:  COZAAR   prazosin 2 MG  capsule Commonly known as:  MINIPRESS   risperiDONE 0.5 MG tablet Commonly known as:  RISPERDAL   traZODone 100 MG tablet Commonly known as:  DESYREL     TAKE these medications   acetaminophen 325 MG tablet Commonly known as:  TYLENOL Take 325 mg by mouth 2 (two) times daily.   albuterol 108 (90 Base) MCG/ACT inhaler Commonly known as:  PROVENTIL HFA;VENTOLIN HFA Inhale 2 puffs into the lungs every 6 (six) hours as needed for wheezing or shortness of breath.   albuterol (2.5 MG/3ML) 0.083% nebulizer solution Commonly known as:  PROVENTIL Take 2.5 mg by nebulization every 6 (six) hours as needed for wheezing.   amLODipine 5 MG tablet Commonly known as:  NORVASC Take 1 tablet (5 mg total) by mouth daily.   aspirin 81 MG tablet Take 81 mg by mouth daily.   atorvastatin 40 MG tablet Commonly known as:  LIPITOR Take 40 mg by mouth at bedtime.   budesonide-formoterol 160-4.5 MCG/ACT inhaler Commonly known as:  SYMBICORT Inhale 2 puffs into the lungs 2 (two) times daily.   carvedilol 25 MG tablet Commonly known as:  COREG Take 25 mg by mouth 2 (two) times daily with a meal.   DAILY VITE PO Take 1 tablet by mouth daily.   docusate sodium 100 MG capsule Commonly known as:  COLACE Take 100 mg by mouth daily.   DULoxetine 60 MG capsule Commonly known as:  CYMBALTA Take 60 mg by mouth daily.   EPINEPHrine 0.3 mg/0.3 mL Devi Commonly known as:  EPI-PEN Inject 0.3 mg into the muscle once as needed (for bee stings, then go to the ED immediately after using).   FLEET LIQUID GLYCERIN SUPP RE Place 1 suppository rectally daily as needed (for constipation).   gabapentin 300 MG capsule Commonly known as:  NEURONTIN Take 300-600 mg by mouth See admin instructions. Take 300 mg by mouth two times a day and 600 mg at bedtime   glipiZIDE 5 MG 24 hr tablet Commonly known as:  GLUCOTROL XL Take 5 mg by mouth 2 (two) times daily.   haloperidol 0.5 MG  tablet Commonly known as:  HALDOL Take 1 tablet (0.5 mg total) by mouth 2 (two) times daily.   hydrocortisone 2.5 % ointment Apply 1 application topically daily as needed (itchy skin).   levothyroxine 25 MCG tablet Commonly known as:  SYNTHROID, LEVOTHROID Take 25 mcg by mouth daily before breakfast.   linagliptin 5 MG Tabs tablet Commonly known as:  TRADJENTA Take 5 mg by mouth daily.   loperamide 2 MG capsule Commonly known as:  IMODIUM Take 2 mg by mouth 3 (three) times daily as needed (for diarrhea).   montelukast 10 MG tablet Commonly known as:  SINGULAIR Take 10 mg by mouth at bedtime.   omeprazole 20 MG capsule Commonly known as:  PRILOSEC Take 20 mg by mouth daily.   polyethylene glycol powder powder Commonly known as:  GLYCOLAX/MIRALAX Take 17 g by mouth daily as needed (for constipation).   pyridOXINE 100 MG tablet Commonly known as:  VITAMIN B-6 Take 200 mg by mouth 2 (two) times daily.   spironolactone 25 MG tablet Commonly known as:  ALDACTONE Take 1 tablet (25 mg total) by mouth daily.  torsemide 20 MG tablet Commonly known as:  DEMADEX Take 60 mg alternating with 40 mg daily. What changed:    how much to take  how to take this  when to take this  additional instructions   TRESIBA FLEXTOUCH 100 UNIT/ML Sopn FlexTouch Pen Generic drug:  insulin degludec Inject 20 Units into the skin at bedtime.   TRULICITY 1.5 BD/5.3GD Sopn Generic drug:  Dulaglutide Inject 1.5 mg into the skin every Friday.   Vitamin D3 50 MCG (2000 UT) Tabs Take 4,000 Units by mouth daily.     Relevant Imaging Results:  Relevant Lab Results:   Additional Information [phone]; RN requested through [facility] for pt  Benard Halsted, LCSW

## 2018-08-02 DIAGNOSIS — N184 Chronic kidney disease, stage 4 (severe): Secondary | ICD-10-CM | POA: Diagnosis not present

## 2018-08-03 DIAGNOSIS — E1122 Type 2 diabetes mellitus with diabetic chronic kidney disease: Secondary | ICD-10-CM | POA: Diagnosis not present

## 2018-08-03 DIAGNOSIS — N184 Chronic kidney disease, stage 4 (severe): Secondary | ICD-10-CM | POA: Diagnosis not present

## 2018-08-03 DIAGNOSIS — I130 Hypertensive heart and chronic kidney disease with heart failure and stage 1 through stage 4 chronic kidney disease, or unspecified chronic kidney disease: Secondary | ICD-10-CM | POA: Diagnosis not present

## 2018-08-03 DIAGNOSIS — I5032 Chronic diastolic (congestive) heart failure: Secondary | ICD-10-CM | POA: Diagnosis not present

## 2018-08-03 DIAGNOSIS — G9340 Encephalopathy, unspecified: Secondary | ICD-10-CM | POA: Diagnosis not present

## 2018-08-03 DIAGNOSIS — D631 Anemia in chronic kidney disease: Secondary | ICD-10-CM | POA: Diagnosis not present

## 2018-08-03 LAB — BASIC METABOLIC PANEL
BUN/Creatinine Ratio: 18 (calc) (ref 6–22)
BUN: 45 mg/dL — ABNORMAL HIGH (ref 7–25)
CO2: 27 mmol/L (ref 20–32)
Calcium: 8.8 mg/dL (ref 8.6–10.4)
Chloride: 100 mmol/L (ref 98–110)
Creat: 2.54 mg/dL — ABNORMAL HIGH (ref 0.50–0.99)
Glucose, Bld: 274 mg/dL — ABNORMAL HIGH (ref 65–139)
Potassium: 4.2 mmol/L (ref 3.5–5.3)
Sodium: 138 mmol/L (ref 135–146)

## 2018-08-04 DIAGNOSIS — I5032 Chronic diastolic (congestive) heart failure: Secondary | ICD-10-CM | POA: Diagnosis not present

## 2018-08-04 DIAGNOSIS — D631 Anemia in chronic kidney disease: Secondary | ICD-10-CM | POA: Diagnosis not present

## 2018-08-04 DIAGNOSIS — N184 Chronic kidney disease, stage 4 (severe): Secondary | ICD-10-CM | POA: Diagnosis not present

## 2018-08-04 DIAGNOSIS — G9340 Encephalopathy, unspecified: Secondary | ICD-10-CM | POA: Diagnosis not present

## 2018-08-04 DIAGNOSIS — I130 Hypertensive heart and chronic kidney disease with heart failure and stage 1 through stage 4 chronic kidney disease, or unspecified chronic kidney disease: Secondary | ICD-10-CM | POA: Diagnosis not present

## 2018-08-04 DIAGNOSIS — E1122 Type 2 diabetes mellitus with diabetic chronic kidney disease: Secondary | ICD-10-CM | POA: Diagnosis not present

## 2018-08-07 DIAGNOSIS — Z6839 Body mass index (BMI) 39.0-39.9, adult: Secondary | ICD-10-CM | POA: Diagnosis not present

## 2018-08-07 DIAGNOSIS — Z299 Encounter for prophylactic measures, unspecified: Secondary | ICD-10-CM | POA: Diagnosis not present

## 2018-08-07 DIAGNOSIS — I10 Essential (primary) hypertension: Secondary | ICD-10-CM | POA: Diagnosis not present

## 2018-08-07 DIAGNOSIS — E1122 Type 2 diabetes mellitus with diabetic chronic kidney disease: Secondary | ICD-10-CM | POA: Diagnosis not present

## 2018-08-07 DIAGNOSIS — N184 Chronic kidney disease, stage 4 (severe): Secondary | ICD-10-CM | POA: Diagnosis not present

## 2018-08-07 DIAGNOSIS — D631 Anemia in chronic kidney disease: Secondary | ICD-10-CM | POA: Diagnosis not present

## 2018-08-07 DIAGNOSIS — G9340 Encephalopathy, unspecified: Secondary | ICD-10-CM | POA: Diagnosis not present

## 2018-08-07 DIAGNOSIS — E1165 Type 2 diabetes mellitus with hyperglycemia: Secondary | ICD-10-CM | POA: Diagnosis not present

## 2018-08-07 DIAGNOSIS — J449 Chronic obstructive pulmonary disease, unspecified: Secondary | ICD-10-CM | POA: Diagnosis not present

## 2018-08-07 DIAGNOSIS — I130 Hypertensive heart and chronic kidney disease with heart failure and stage 1 through stage 4 chronic kidney disease, or unspecified chronic kidney disease: Secondary | ICD-10-CM | POA: Diagnosis not present

## 2018-08-07 DIAGNOSIS — N183 Chronic kidney disease, stage 3 (moderate): Secondary | ICD-10-CM | POA: Diagnosis not present

## 2018-08-07 DIAGNOSIS — I5032 Chronic diastolic (congestive) heart failure: Secondary | ICD-10-CM | POA: Diagnosis not present

## 2018-08-08 ENCOUNTER — Telehealth: Payer: Self-pay | Admitting: Cardiology

## 2018-08-08 NOTE — Telephone Encounter (Signed)
Butch Penny at [facility] notified and order called to [place] at [facility].

## 2018-08-08 NOTE — Telephone Encounter (Signed)
Per [facility] pt has gained 4lbs since yesterday

## 2018-08-08 NOTE — Telephone Encounter (Signed)
I have not seen her recently but I did review the chart, she was just discharged after admission with encephalopathy.  Her medications were adjusted.  Discharge summary indicates Demadex to be taken at 60 mg alternating with 40 mg every other day.  Would suggest using Demadex 60 mg daily for 2 days and then resume prior program.

## 2018-08-08 NOTE — Telephone Encounter (Signed)
Spoke with [name] at [facility] who reports that pt was SOB the day before yesterday but none today. No other complaints at this time. Please advise

## 2018-08-10 DIAGNOSIS — R202 Paresthesia of skin: Secondary | ICD-10-CM | POA: Diagnosis not present

## 2018-08-10 DIAGNOSIS — R442 Other hallucinations: Secondary | ICD-10-CM | POA: Diagnosis not present

## 2018-08-10 DIAGNOSIS — R197 Diarrhea, unspecified: Secondary | ICD-10-CM | POA: Diagnosis not present

## 2018-08-10 DIAGNOSIS — N184 Chronic kidney disease, stage 4 (severe): Secondary | ICD-10-CM | POA: Diagnosis not present

## 2018-08-10 DIAGNOSIS — R443 Hallucinations, unspecified: Secondary | ICD-10-CM | POA: Diagnosis not present

## 2018-08-10 DIAGNOSIS — E1122 Type 2 diabetes mellitus with diabetic chronic kidney disease: Secondary | ICD-10-CM | POA: Diagnosis not present

## 2018-08-10 DIAGNOSIS — I130 Hypertensive heart and chronic kidney disease with heart failure and stage 1 through stage 4 chronic kidney disease, or unspecified chronic kidney disease: Secondary | ICD-10-CM | POA: Diagnosis not present

## 2018-08-10 DIAGNOSIS — G9340 Encephalopathy, unspecified: Secondary | ICD-10-CM | POA: Diagnosis not present

## 2018-08-10 DIAGNOSIS — L03115 Cellulitis of right lower limb: Secondary | ICD-10-CM | POA: Diagnosis not present

## 2018-08-10 DIAGNOSIS — D631 Anemia in chronic kidney disease: Secondary | ICD-10-CM | POA: Diagnosis not present

## 2018-08-10 DIAGNOSIS — I5032 Chronic diastolic (congestive) heart failure: Secondary | ICD-10-CM | POA: Diagnosis not present

## 2018-08-10 DIAGNOSIS — I10 Essential (primary) hypertension: Secondary | ICD-10-CM | POA: Diagnosis not present

## 2018-08-10 DIAGNOSIS — J209 Acute bronchitis, unspecified: Secondary | ICD-10-CM | POA: Diagnosis not present

## 2018-08-10 DIAGNOSIS — R404 Transient alteration of awareness: Secondary | ICD-10-CM | POA: Diagnosis not present

## 2018-08-10 DIAGNOSIS — F29 Unspecified psychosis not due to a substance or known physiological condition: Secondary | ICD-10-CM | POA: Diagnosis not present

## 2018-08-10 DIAGNOSIS — R0689 Other abnormalities of breathing: Secondary | ICD-10-CM | POA: Diagnosis not present

## 2018-08-10 DIAGNOSIS — N189 Chronic kidney disease, unspecified: Secondary | ICD-10-CM | POA: Diagnosis not present

## 2018-08-10 DIAGNOSIS — D649 Anemia, unspecified: Secondary | ICD-10-CM | POA: Diagnosis not present

## 2018-08-10 DIAGNOSIS — I4510 Unspecified right bundle-branch block: Secondary | ICD-10-CM | POA: Diagnosis not present

## 2018-08-10 DIAGNOSIS — E1165 Type 2 diabetes mellitus with hyperglycemia: Secondary | ICD-10-CM | POA: Diagnosis not present

## 2018-08-11 DIAGNOSIS — Z743 Need for continuous supervision: Secondary | ICD-10-CM | POA: Diagnosis not present

## 2018-08-11 DIAGNOSIS — N189 Chronic kidney disease, unspecified: Secondary | ICD-10-CM | POA: Diagnosis not present

## 2018-08-11 DIAGNOSIS — D649 Anemia, unspecified: Secondary | ICD-10-CM | POA: Diagnosis not present

## 2018-08-11 DIAGNOSIS — R0902 Hypoxemia: Secondary | ICD-10-CM | POA: Diagnosis not present

## 2018-08-11 DIAGNOSIS — R443 Hallucinations, unspecified: Secondary | ICD-10-CM | POA: Diagnosis not present

## 2018-08-11 DIAGNOSIS — R442 Other hallucinations: Secondary | ICD-10-CM | POA: Diagnosis not present

## 2018-08-11 DIAGNOSIS — G9340 Encephalopathy, unspecified: Secondary | ICD-10-CM | POA: Diagnosis not present

## 2018-08-11 DIAGNOSIS — D631 Anemia in chronic kidney disease: Secondary | ICD-10-CM | POA: Diagnosis not present

## 2018-08-11 DIAGNOSIS — E039 Hypothyroidism, unspecified: Secondary | ICD-10-CM | POA: Diagnosis not present

## 2018-08-11 DIAGNOSIS — N184 Chronic kidney disease, stage 4 (severe): Secondary | ICD-10-CM | POA: Diagnosis not present

## 2018-08-11 DIAGNOSIS — Z79899 Other long term (current) drug therapy: Secondary | ICD-10-CM | POA: Diagnosis not present

## 2018-08-11 DIAGNOSIS — J986 Disorders of diaphragm: Secondary | ICD-10-CM | POA: Diagnosis not present

## 2018-08-11 DIAGNOSIS — E7800 Pure hypercholesterolemia, unspecified: Secondary | ICD-10-CM | POA: Diagnosis not present

## 2018-08-11 DIAGNOSIS — I130 Hypertensive heart and chronic kidney disease with heart failure and stage 1 through stage 4 chronic kidney disease, or unspecified chronic kidney disease: Secondary | ICD-10-CM | POA: Diagnosis not present

## 2018-08-11 DIAGNOSIS — E1122 Type 2 diabetes mellitus with diabetic chronic kidney disease: Secondary | ICD-10-CM | POA: Diagnosis not present

## 2018-08-11 DIAGNOSIS — I5032 Chronic diastolic (congestive) heart failure: Secondary | ICD-10-CM | POA: Diagnosis not present

## 2018-08-11 DIAGNOSIS — E119 Type 2 diabetes mellitus without complications: Secondary | ICD-10-CM | POA: Diagnosis not present

## 2018-08-11 DIAGNOSIS — R440 Auditory hallucinations: Secondary | ICD-10-CM | POA: Diagnosis not present

## 2018-08-11 DIAGNOSIS — F209 Schizophrenia, unspecified: Secondary | ICD-10-CM | POA: Diagnosis not present

## 2018-08-11 DIAGNOSIS — Z046 Encounter for general psychiatric examination, requested by authority: Secondary | ICD-10-CM | POA: Diagnosis not present

## 2018-08-14 DIAGNOSIS — I5032 Chronic diastolic (congestive) heart failure: Secondary | ICD-10-CM | POA: Diagnosis not present

## 2018-08-14 DIAGNOSIS — E1122 Type 2 diabetes mellitus with diabetic chronic kidney disease: Secondary | ICD-10-CM | POA: Diagnosis not present

## 2018-08-14 DIAGNOSIS — I130 Hypertensive heart and chronic kidney disease with heart failure and stage 1 through stage 4 chronic kidney disease, or unspecified chronic kidney disease: Secondary | ICD-10-CM | POA: Diagnosis not present

## 2018-08-14 DIAGNOSIS — G9340 Encephalopathy, unspecified: Secondary | ICD-10-CM | POA: Diagnosis not present

## 2018-08-14 DIAGNOSIS — D631 Anemia in chronic kidney disease: Secondary | ICD-10-CM | POA: Diagnosis not present

## 2018-08-14 DIAGNOSIS — N184 Chronic kidney disease, stage 4 (severe): Secondary | ICD-10-CM | POA: Diagnosis not present

## 2018-08-15 ENCOUNTER — Emergency Department (HOSPITAL_COMMUNITY): Payer: Medicare Other

## 2018-08-15 ENCOUNTER — Emergency Department (HOSPITAL_COMMUNITY)
Admission: EM | Admit: 2018-08-15 | Discharge: 2018-08-15 | Disposition: A | Discharge status: Home / Self Care | Payer: Medicare Other | Source: Home / Self Care | Attending: Emergency Medicine | Admitting: Emergency Medicine

## 2018-08-15 ENCOUNTER — Encounter (HOSPITAL_COMMUNITY): Payer: Self-pay | Admitting: Emergency Medicine

## 2018-08-15 ENCOUNTER — Telehealth: Payer: Self-pay | Admitting: Cardiology

## 2018-08-15 DIAGNOSIS — Z882 Allergy status to sulfonamides status: Secondary | ICD-10-CM | POA: Diagnosis not present

## 2018-08-15 DIAGNOSIS — I130 Hypertensive heart and chronic kidney disease with heart failure and stage 1 through stage 4 chronic kidney disease, or unspecified chronic kidney disease: Secondary | ICD-10-CM

## 2018-08-15 DIAGNOSIS — I4510 Unspecified right bundle-branch block: Secondary | ICD-10-CM | POA: Diagnosis not present

## 2018-08-15 DIAGNOSIS — F4310 Post-traumatic stress disorder, unspecified: Secondary | ICD-10-CM | POA: Diagnosis not present

## 2018-08-15 DIAGNOSIS — Z888 Allergy status to other drugs, medicaments and biological substances status: Secondary | ICD-10-CM | POA: Diagnosis not present

## 2018-08-15 DIAGNOSIS — J449 Chronic obstructive pulmonary disease, unspecified: Secondary | ICD-10-CM | POA: Diagnosis not present

## 2018-08-15 DIAGNOSIS — Z7984 Long term (current) use of oral hypoglycemic drugs: Secondary | ICD-10-CM | POA: Diagnosis not present

## 2018-08-15 DIAGNOSIS — M791 Myalgia, unspecified site: Secondary | ICD-10-CM | POA: Insufficient documentation

## 2018-08-15 DIAGNOSIS — E785 Hyperlipidemia, unspecified: Secondary | ICD-10-CM | POA: Diagnosis not present

## 2018-08-15 DIAGNOSIS — Z794 Long term (current) use of insulin: Secondary | ICD-10-CM

## 2018-08-15 DIAGNOSIS — N184 Chronic kidney disease, stage 4 (severe): Secondary | ICD-10-CM

## 2018-08-15 DIAGNOSIS — R112 Nausea with vomiting, unspecified: Secondary | ICD-10-CM

## 2018-08-15 DIAGNOSIS — G9340 Encephalopathy, unspecified: Secondary | ICD-10-CM | POA: Diagnosis not present

## 2018-08-15 DIAGNOSIS — E1165 Type 2 diabetes mellitus with hyperglycemia: Secondary | ICD-10-CM

## 2018-08-15 DIAGNOSIS — R197 Diarrhea, unspecified: Secondary | ICD-10-CM | POA: Insufficient documentation

## 2018-08-15 DIAGNOSIS — Z9049 Acquired absence of other specified parts of digestive tract: Secondary | ICD-10-CM | POA: Diagnosis not present

## 2018-08-15 DIAGNOSIS — R079 Chest pain, unspecified: Secondary | ICD-10-CM | POA: Diagnosis not present

## 2018-08-15 DIAGNOSIS — R739 Hyperglycemia, unspecified: Secondary | ICD-10-CM

## 2018-08-15 DIAGNOSIS — J9601 Acute respiratory failure with hypoxia: Secondary | ICD-10-CM | POA: Diagnosis not present

## 2018-08-15 DIAGNOSIS — R110 Nausea: Secondary | ICD-10-CM | POA: Diagnosis not present

## 2018-08-15 DIAGNOSIS — Z7982 Long term (current) use of aspirin: Secondary | ICD-10-CM | POA: Insufficient documentation

## 2018-08-15 DIAGNOSIS — Z79899 Other long term (current) drug therapy: Secondary | ICD-10-CM | POA: Insufficient documentation

## 2018-08-15 DIAGNOSIS — I452 Bifascicular block: Secondary | ICD-10-CM | POA: Diagnosis not present

## 2018-08-15 DIAGNOSIS — I5032 Chronic diastolic (congestive) heart failure: Secondary | ICD-10-CM

## 2018-08-15 DIAGNOSIS — R0789 Other chest pain: Secondary | ICD-10-CM | POA: Diagnosis not present

## 2018-08-15 DIAGNOSIS — E1122 Type 2 diabetes mellitus with diabetic chronic kidney disease: Secondary | ICD-10-CM | POA: Diagnosis not present

## 2018-08-15 DIAGNOSIS — F329 Major depressive disorder, single episode, unspecified: Secondary | ICD-10-CM | POA: Diagnosis not present

## 2018-08-15 DIAGNOSIS — I272 Pulmonary hypertension, unspecified: Secondary | ICD-10-CM | POA: Diagnosis not present

## 2018-08-15 DIAGNOSIS — R569 Unspecified convulsions: Secondary | ICD-10-CM | POA: Diagnosis not present

## 2018-08-15 DIAGNOSIS — F202 Catatonic schizophrenia: Secondary | ICD-10-CM | POA: Diagnosis not present

## 2018-08-15 DIAGNOSIS — Z6839 Body mass index (BMI) 39.0-39.9, adult: Secondary | ICD-10-CM | POA: Diagnosis not present

## 2018-08-15 DIAGNOSIS — Z87891 Personal history of nicotine dependence: Secondary | ICD-10-CM

## 2018-08-15 DIAGNOSIS — R4182 Altered mental status, unspecified: Secondary | ICD-10-CM | POA: Diagnosis not present

## 2018-08-15 DIAGNOSIS — Z880 Allergy status to penicillin: Secondary | ICD-10-CM | POA: Diagnosis not present

## 2018-08-15 DIAGNOSIS — Z4682 Encounter for fitting and adjustment of non-vascular catheter: Secondary | ICD-10-CM | POA: Diagnosis not present

## 2018-08-15 DIAGNOSIS — R600 Localized edema: Secondary | ICD-10-CM

## 2018-08-15 DIAGNOSIS — M79606 Pain in leg, unspecified: Secondary | ICD-10-CM

## 2018-08-15 DIAGNOSIS — Z885 Allergy status to narcotic agent status: Secondary | ICD-10-CM | POA: Diagnosis not present

## 2018-08-15 DIAGNOSIS — R609 Edema, unspecified: Secondary | ICD-10-CM

## 2018-08-15 DIAGNOSIS — E039 Hypothyroidism, unspecified: Secondary | ICD-10-CM | POA: Diagnosis not present

## 2018-08-15 LAB — BLOOD GAS, VENOUS
Acid-base deficit: 1.1 mmol/L (ref 0.0–2.0)
Bicarbonate: 22.9 mmol/L (ref 20.0–28.0)
FIO2: 0.21
O2 Saturation: 76.7 %
Patient temperature: 36.6
pCO2, Ven: 46 mmHg (ref 44.0–60.0)
pH, Ven: 7.336 (ref 7.250–7.430)
pO2, Ven: 43.9 mmHg (ref 32.0–45.0)

## 2018-08-15 LAB — COMPREHENSIVE METABOLIC PANEL
ALT: 30 U/L (ref 0–44)
AST: 22 U/L (ref 15–41)
Albumin: 3.2 g/dL — ABNORMAL LOW (ref 3.5–5.0)
Alkaline Phosphatase: 74 U/L (ref 38–126)
Anion gap: 8 (ref 5–15)
BUN: 35 mg/dL — ABNORMAL HIGH (ref 8–23)
CO2: 23 mmol/L (ref 22–32)
Calcium: 8.7 mg/dL — ABNORMAL LOW (ref 8.9–10.3)
Chloride: 104 mmol/L (ref 98–111)
Creatinine, Ser: 2.37 mg/dL — ABNORMAL HIGH (ref 0.44–1.00)
GFR calc Af Amer: 24 mL/min — ABNORMAL LOW (ref >60)
GFR calc non Af Amer: 21 mL/min — ABNORMAL LOW (ref >60)
Glucose, Bld: 300 mg/dL — ABNORMAL HIGH (ref 70–99)
Potassium: 4.1 mmol/L (ref 3.5–5.1)
Sodium: 135 mmol/L (ref 135–145)
Total Bilirubin: 0.4 mg/dL (ref 0.3–1.2)
Total Protein: 6.4 g/dL — ABNORMAL LOW (ref 6.5–8.1)

## 2018-08-15 LAB — INFLUENZA PANEL BY PCR (TYPE A & B)
Influenza A By PCR: NEGATIVE
Influenza B By PCR: NEGATIVE

## 2018-08-15 LAB — CBC WITH DIFFERENTIAL/PLATELET
Abs Immature Granulocytes: 0.04 10*3/uL (ref 0.00–0.07)
Basophils Absolute: 0 10*3/uL (ref 0.0–0.1)
Basophils Relative: 0 %
Eosinophils Absolute: 0.3 10*3/uL (ref 0.0–0.5)
Eosinophils Relative: 3 %
HCT: 28 % — ABNORMAL LOW (ref 36.0–46.0)
Hemoglobin: 9.4 g/dL — ABNORMAL LOW (ref 12.0–15.0)
Immature Granulocytes: 1 %
Lymphocytes Relative: 12 %
Lymphs Abs: 1.1 10*3/uL (ref 0.7–4.0)
MCH: 32.9 pg (ref 26.0–34.0)
MCHC: 33.6 g/dL (ref 30.0–36.0)
MCV: 97.9 fL (ref 80.0–100.0)
Monocytes Absolute: 0.7 10*3/uL (ref 0.1–1.0)
Monocytes Relative: 8 %
Neutro Abs: 6.7 10*3/uL (ref 1.7–7.7)
Neutrophils Relative %: 76 %
Platelets: 205 10*3/uL (ref 150–400)
RBC: 2.86 MIL/uL — ABNORMAL LOW (ref 3.87–5.11)
RDW: 13.9 % (ref 11.5–15.5)
WBC: 8.9 10*3/uL (ref 4.0–10.5)
nRBC: 0 % (ref 0.0–0.2)

## 2018-08-15 LAB — URINALYSIS, ROUTINE W REFLEX MICROSCOPIC
Bacteria, UA: NONE SEEN
Bilirubin Urine: NEGATIVE
Glucose, UA: >=500 mg/dL — AB
Ketones, ur: NEGATIVE mg/dL
Nitrite: NEGATIVE
Protein, ur: 100 mg/dL — AB
Specific Gravity, Urine: 1.005 (ref 1.005–1.030)
pH: 6 (ref 5.0–8.0)

## 2018-08-15 LAB — TROPONIN I: Troponin I: <0.03 ng/mL (ref <0.03)

## 2018-08-15 LAB — CBG MONITORING, ED: Glucose-Capillary: 289 mg/dL — ABNORMAL HIGH (ref 70–99)

## 2018-08-15 LAB — BRAIN NATRIURETIC PEPTIDE: B Natriuretic Peptide: 100 pg/mL (ref 0.0–100.0)

## 2018-08-15 LAB — LIPASE, BLOOD: Lipase: 48 U/L (ref 11–51)

## 2018-08-15 MED ORDER — HYDROCODONE-ACETAMINOPHEN 5-325 MG PO TABS: 1.0000 | ORAL_TABLET | Freq: Four times a day (QID) | ORAL | 0 refills | Status: DC | PRN

## 2018-08-15 MED ORDER — HYDROCODONE-ACETAMINOPHEN 5-325 MG PO TABS
1.0000 | ORAL_TABLET | Freq: Once | ORAL | Status: AC
  Administered 2018-08-15: 1 via ORAL
  Filled 2018-08-15: qty 1

## 2018-08-15 NOTE — Telephone Encounter (Signed)
Calling to report 3 lb weight gain overnight. / tg

## 2018-08-15 NOTE — Telephone Encounter (Signed)
I called [name] back at [facility], patient c/o not feeling well, vomited, had blood glucose over 500 mg/dl, pt was sent to ED

## 2018-08-15 NOTE — ED Notes (Signed)
Report given to [facility] and informed pt is ready to be picked up for discharge

## 2018-08-15 NOTE — Telephone Encounter (Signed)
Last call was 08/08/18 and pt was instructed by Dr.McDowell to take Demadex 60 mg daily for 2 days and then resume 60 mg alternating 40 mg prior dose   I will ask B Strader PA-C for instructions

## 2018-08-15 NOTE — ED Triage Notes (Signed)
Pt brought in by ems from [facility] for not feeling well.  Sent with recent d/c summary from [facility] which shows insulin on pt's medication list, although this not on the [facility] from nsg facility.

## 2018-08-15 NOTE — Telephone Encounter (Signed)
    Would again recommend taking 60mg  daily for the next 2 days then reducing back to 40mg  daily alternating with 60mg  daily. Hesitant to change her baseline dose given Stage 4 CKD. Would again advise follow-up with Nephrology as they were previously managing her diuretic dosing and she was last evaluated by them in 05/2018 by review of Care Everywhere.   Signed, Erma Heritage, PA-C 08/15/2018, 2:13 PM Pager: [phone]

## 2018-08-15 NOTE — ED Provider Notes (Signed)
[facility] EMERGENCY DEPARTMENT Provider Note   CSN: [id number] Arrival date & time: 08/15/18  1424     History   Chief Complaint Chief Complaint  Patient presents with  . Hyperglycemia    HPI [name] is a 67 y.o. female.  She is brought in by EMS from [facility] with complaint of not feeling well.  She said everything happened starting this morning.  She is got pain in her legs and pain all over her entire body.  She is complaining of chest pain.  She is complaining of abdominal pain generalized along with 2 episodes of vomiting and multiple episodes of diarrhea.  She does not know if there is blood from either end.  She said she is been urinating a lot.  No cough no headache no blurry vision.  No known fevers or chills.  It sounds like maybe she has not been receiving her insulin at her facility.  She was recently seen here for strokelike symptoms and altered mental status for which she was intubated and eventually shipped to [facility].  I do not know that the etiology of her altered mental status was identified but she was extubated and eventually discharged.  The history is provided by the patient and the EMS personnel.  Hyperglycemia  Blood sugar level PTA:  High Severity:  Unable to specify Onset quality:  Unable to specify Progression:  Unchanged Chronicity:  New Diabetes status:  Controlled with insulin Context: change in medication and recent illness   Relieved by:  Nothing Ineffective treatments:  None tried Associated symptoms: abdominal pain, chest pain, malaise, nausea, polyuria, vomiting and weakness (generalized)   Associated symptoms: no altered mental status, no blurred vision, no confusion, no dizziness, no dysuria, no fatigue, no fever, no increased appetite, no increased thirst, no shortness of breath and no syncope     Past Medical History:  Diagnosis Date  . Anxiety   . Asthma   . Back pain   . Barrett esophagus   . CHF (congestive heart failure)  ([place])   . CKD (chronic kidney disease) stage 3, GFR 30-59 ml/min (HCC)   . COPD (chronic obstructive pulmonary disease) ([place])   . Depression   . Diastolic heart failure ([place])   . Essential hypertension   . Fibromyalgia   . GERD (gastroesophageal reflux disease)   . Gout   . History of pericarditis   . Hyperlipidemia   . Hypothyroid   . Major depressive disorder   . Memory loss   . Nephrolithiasis    2008  . Obesity hypoventilation syndrome (HCC)    CPAP  . Obstructive sleep apnea   . PE (pulmonary embolism)    2010  . Peripheral neuropathy   . PTSD (post-traumatic stress disorder)   . Rheumatoid arthritis ([place])   . Schizophrenia ([place])   . Steroid dependence   . Type 2 diabetes mellitus ([place])   . Vitamin D deficiency     Patient Active Problem List   Diagnosis Date Noted  . Acute encephalopathy 07/27/2018  . Endotracheally intubated 07/27/2018  . Hypothyroidism 07/27/2018  . Pleuritic chest pain 07/14/2018  . Moderate persistent asthma without complication   . Type 2 diabetes mellitus ([place]) 04/19/2018  . Acute kidney injury superimposed on chronic kidney disease ([place]) 01/13/2018  . Altered mental status   . Encephalopathy 10/11/2016  . Chest pain 08/12/2016  . CKD (chronic kidney disease), stage IV ([place]) 08/12/2016  . History of pulmonary embolus (PE) 08/12/2016  . Obesity,  Class III, BMI 40-49.9 (morbid obesity) ([place]) 08/12/2016  . RBBB 08/12/2016  . Positive D dimer 08/12/2016  . CVA (cerebral infarction) 11/10/2015  . Physical deconditioning 07/01/2011  . Weakness 07/01/2011  . Diabetic foot ulcer ([place]) 05/19/2011  . Polypharmacy 02/09/2011  . BACK PAIN WITH RADICULOPATHY 05/25/2010  . Type 2 diabetes mellitus with diabetic polyneuropathy, without long-term current use of insulin ([place]) 08/18/2009  . Chronic diastolic heart failure ([place]) 04/25/2009  . Normocytic anemia 04/14/2009  . Sleep apnea 12/11/2008  . Diabetic peripheral neuropathy ([place]) 05/04/2007  .  HYPERCHOLESTEROLEMIA 10/13/2006  . Gout, unspecified 10/13/2006  . HYPOKALEMIA 10/13/2006  . Schizophrenia ([place]) 10/13/2006  . Depression 10/13/2006  . Essential hypertension 10/13/2006  . Venous (peripheral) insufficiency 10/13/2006  . RHINITIS, ALLERGIC 10/13/2006  . Asthma 10/13/2006  . Reflux esophagitis 10/13/2006  . OSTEOARTHRITIS, MULTI SITES 10/13/2006  . INCONTINENCE, URGE 10/13/2006    Past Surgical History:  Procedure Laterality Date  . APPENDECTOMY    . CHOLECYSTECTOMY    . COLONOSCOPY     benign polyps (12/2000), repeat colonoscopy performed in 2007  . ENDOMETRIAL BIOPSY     10/03 benign columnar mucosa (limited material)  . KIDNEY SURGERY       OB History   No obstetric history on file.      Home Medications    Prior to Admission medications   Medication Sig Start Date End Date Taking? Authorizing Provider  acetaminophen (TYLENOL) 325 MG tablet Take 325 mg by mouth 2 (two) times daily.     [provider]  albuterol (PROVENTIL HFA;VENTOLIN HFA) 108 (90 Base) MCG/ACT inhaler Inhale 2 puffs into the lungs every 6 (six) hours as needed for wheezing or shortness of breath.    [provider]  albuterol (PROVENTIL) (2.5 MG/3ML) 0.083% nebulizer solution Take 2.5 mg by nebulization every 6 (six) hours as needed for wheezing.     [provider]  amLODipine (NORVASC) 5 MG tablet Take 1 tablet (5 mg total) by mouth daily. 07/31/18 07/31/19  GhimireHenreitta Leber, MD  aspirin 81 MG tablet Take 81 mg by mouth daily.    [provider]  atorvastatin (LIPITOR) 40 MG tablet Take 40 mg by mouth at bedtime.     [provider]  budesonide-formoterol (SYMBICORT) 160-4.5 MCG/ACT inhaler Inhale 2 puffs into the lungs 2 (two) times daily.    [provider]  carvedilol (COREG) 25 MG tablet Take 25 mg by mouth 2 (two) times daily with a meal.    [provider]  Cholecalciferol (VITAMIN D3) 50 MCG (2000 UT) TABS Take  4,000 Units by mouth daily.    [provider]  docusate sodium (COLACE) 100 MG capsule Take 100 mg by mouth daily.     [provider]  Dulaglutide (TRULICITY) 1.5 WU/9.8JX SOPN Inject 1.5 mg into the skin every Friday.     [provider]  DULoxetine (CYMBALTA) 60 MG capsule Take 60 mg by mouth daily.    [provider]  EPINEPHrine (EPI-PEN) 0.3 mg/0.3 mL DEVI Inject 0.3 mg into the muscle once as needed (for bee stings, then go to the ED immediately after using).     [provider]  gabapentin (NEURONTIN) 300 MG capsule Take 300-600 mg by mouth See admin instructions. Take 300 mg by mouth two times a day and 600 mg at bedtime 04/29/17   [provider]  glipiZIDE (GLUCOTROL XL) 5 MG 24 hr tablet Take 5 mg by mouth 2 (two) times  daily.     [provider]  Glycerin, Laxative, (FLEET LIQUID GLYCERIN SUPP RE) Place 1 suppository rectally daily as needed (for constipation).    [provider]  haloperidol (HALDOL) 0.5 MG tablet Take 1 tablet (0.5 mg total) by mouth 2 (two) times daily. 07/31/18   Ghimire, Henreitta Leber, MD  hydrocortisone 2.5 % ointment Apply 1 application topically daily as needed (itchy skin).    [provider]  insulin degludec (TRESIBA FLEXTOUCH) 100 UNIT/ML SOPN FlexTouch Pen Inject 20 Units into the skin at bedtime.    [provider]  levothyroxine (SYNTHROID, LEVOTHROID) 25 MCG tablet Take 25 mcg by mouth daily before breakfast.    [provider]  linagliptin (TRADJENTA) 5 MG TABS tablet Take 5 mg by mouth daily.    [provider]  loperamide (IMODIUM) 2 MG capsule Take 2 mg by mouth 3 (three) times daily as needed (for diarrhea).     [provider]  montelukast (SINGULAIR) 10 MG tablet Take 10 mg by mouth at bedtime.    [provider]  Multiple Vitamin (DAILY VITE PO) Take 1 tablet by mouth daily.    [provider]  omeprazole (PRILOSEC) 20  MG capsule Take 20 mg by mouth daily.     [provider]  polyethylene glycol powder (GLYCOLAX/MIRALAX) powder Take 17 g by mouth daily as needed (for constipation).    [provider]  pyridOXINE (VITAMIN B-6) 100 MG tablet Take 200 mg by mouth 2 (two) times daily.    [provider]  spironolactone (ALDACTONE) 25 MG tablet Take 1 tablet (25 mg total) by mouth daily. 04/12/18   Mesner, Corene Cornea, MD  torsemide (DEMADEX) 20 MG tablet Take 60 mg alternating with 40 mg daily. Patient taking differently: Take 40 mg by mouth daily.  07/26/18   Erma Heritage, PA-C    Family History Family History  Problem Relation Age of Onset  . Bone cancer Mother   . Hypertension Mother   . Heart disease Mother   . COPD Father   . Heart disease Father   . Stroke Father     Social History Social History   Tobacco Use  . Smoking status: Former Smoker    Types: Cigarettes    Last attempt to quit: 06/23/1972    Years since quitting: 46.1  . Smokeless tobacco: Never Used  Substance Use Topics  . Alcohol use: No  . Drug use: No     Allergies   Benztropine mesylate; Codeine; Diazepam; Diphenhydramine hcl; Penicillins; Sulfonamide derivatives; Thioridazine hcl; Benztropine; Penicillin g; Lisinopril; and Sulfamethoxazole   Review of Systems Review of Systems  Constitutional: Negative for fatigue and fever.  HENT: Negative for sore throat.   Eyes: Negative for blurred vision and visual disturbance.  Respiratory: Negative for shortness of breath.   Cardiovascular: Positive for chest pain. Negative for syncope.  Gastrointestinal: Positive for abdominal pain, nausea and vomiting.  Endocrine: Positive for polyuria. Negative for polydipsia.  Genitourinary: Positive for frequency. Negative for dysuria.  Musculoskeletal: Positive for arthralgias and myalgias.  Skin: Negative for rash.  Neurological: Positive for weakness (generalized). Negative for dizziness and headaches.    Psychiatric/Behavioral: Negative for confusion.     Physical Exam Updated Vital Signs BP (!) 173/81 (BP Location: Right Arm)   Pulse 88   Temp 97.8 F (36.6 C) (Oral)   Resp 18   Ht 5\' 3"  (1.6 m)   Wt 97.5 kg   SpO2 92%   BMI  38.08 kg/m   Physical Exam Vitals signs and nursing note reviewed.  Constitutional:      General: She is not in acute distress.    Appearance: She is well-developed. She is obese.  HENT:     Head: Normocephalic and atraumatic.  Eyes:     Conjunctiva/sclera: Conjunctivae normal.  Neck:     Musculoskeletal: Neck supple.  Cardiovascular:     Rate and Rhythm: Normal rate and regular rhythm.     Pulses: Normal pulses.     Heart sounds: No murmur.  Pulmonary:     Effort: Pulmonary effort is normal. No respiratory distress.     Breath sounds: Normal breath sounds.  Abdominal:     Palpations: Abdomen is soft.     Tenderness: There is abdominal tenderness (generalized). There is no guarding or rebound.  Musculoskeletal:        General: No deformity.     Right lower leg: Edema present.     Left lower leg: Edema present.  Skin:    General: Skin is warm and dry.     Capillary Refill: Capillary refill takes less than 2 seconds.  Neurological:     Mental Status: She is alert. She is disoriented.     Sensory: No sensory deficit.     Motor: No weakness.     Comments: Patient is alert and oriented to person and place.  She is not exactly sure of the date.  She is moving all extremities non-focally.      ED Treatments / Results  Labs (all labs ordered are listed, but only abnormal results are displayed) Labs Reviewed  COMPREHENSIVE METABOLIC PANEL - Abnormal; Notable for the following components:      Result Value   Glucose, Bld 300 (*)    BUN 35 (*)    Creatinine, Ser 2.37 (*)    Calcium 8.7 (*)    Total Protein 6.4 (*)    Albumin 3.2 (*)    GFR calc non Af Amer 21 (*)    GFR calc Af Amer 24 (*)    All other components within normal limits   CBC WITH DIFFERENTIAL/PLATELET - Abnormal; Notable for the following components:   RBC 2.86 (*)    Hemoglobin 9.4 (*)    HCT 28.0 (*)    All other components within normal limits  URINALYSIS, ROUTINE W REFLEX MICROSCOPIC - Abnormal; Notable for the following components:   Color, Urine STRAW (*)    Glucose, UA >=500 (*)    Hgb urine dipstick SMALL (*)    Protein, ur 100 (*)    Leukocytes, UA TRACE (*)    All other components within normal limits  CBG MONITORING, ED - Abnormal; Notable for the following components:   Glucose-Capillary 289 (*)    All other components within normal limits  C DIFFICILE QUICK SCREEN W PCR REFLEX  LIPASE, BLOOD  TROPONIN I  BLOOD GAS, VENOUS  INFLUENZA PANEL BY PCR (TYPE A & B)  BRAIN NATRIURETIC PEPTIDE    EKG EKG Interpretation  Date/Time:  Tuesday August 15 2018 14:55:27 EST Ventricular Rate:  82 PR Interval:    QRS Duration: 172 QT Interval:  443 QTC Calculation: 518 R Axis:   -54 Text Interpretation:  Sinus rhythm RBBB and LAFB Left ventricular hypertrophy Baseline wander in lead(s) V1 V6 similar to prior 12/19 Confirmed by Aletta Edouard [phone]) on 08/15/2018 2:58:02 PM   Radiology US Venous Img Lower Bilateral  Result Date: 08/15/2018 CLINICAL DATA:  Bilateral lower  extremity pain and edema since this morning. Evaluate for DVT. EXAM: BILATERAL LOWER EXTREMITY VENOUS DOPPLER ULTRASOUND TECHNIQUE: Gray-scale sonography with graded compression, as well as color Doppler and duplex ultrasound were performed to evaluate the lower extremity deep venous systems from the level of the common femoral vein and including the common femoral, femoral, profunda femoral, popliteal and calf veins including the posterior tibial, peroneal and gastrocnemius veins when visible. The superficial great saphenous vein was also interrogated. Spectral Doppler was utilized to evaluate flow at rest and with distal augmentation maneuvers in the common femoral,  femoral and popliteal veins. COMPARISON:  None. FINDINGS: RIGHT LOWER EXTREMITY Common Femoral Vein: No evidence of thrombus. Normal compressibility, respiratory phasicity and response to augmentation. Saphenofemoral Junction: No evidence of thrombus. Normal compressibility and flow on color Doppler imaging. Profunda Femoral Vein: No evidence of thrombus. Normal compressibility and flow on color Doppler imaging. Femoral Vein: No evidence of thrombus. Normal compressibility, respiratory phasicity and response to augmentation. Popliteal Vein: No evidence of thrombus. Normal compressibility, respiratory phasicity and response to augmentation. Calf Veins: No evidence of thrombus. Normal compressibility and flow on color Doppler imaging. Superficial Great Saphenous Vein: No evidence of thrombus. Normal compressibility. Venous Reflux:  None. Other Findings:  None. LEFT LOWER EXTREMITY Common Femoral Vein: No evidence of thrombus. Normal compressibility, respiratory phasicity and response to augmentation. Saphenofemoral Junction: No evidence of thrombus. Normal compressibility and flow on color Doppler imaging. Profunda Femoral Vein: No evidence of thrombus. Normal compressibility and flow on color Doppler imaging. Femoral Vein: No evidence of thrombus. Normal compressibility, respiratory phasicity and response to augmentation. Popliteal Vein: No evidence of thrombus. Normal compressibility, respiratory phasicity and response to augmentation. Calf Veins: No evidence of thrombus. Normal compressibility and flow on color Doppler imaging. Superficial Great Saphenous Vein: No evidence of thrombus. Normal compressibility. Venous Reflux:  None. Other Findings:  None. IMPRESSION: No evidence of DVT within either lower extremity. Electronically Signed   By: Sandi Mariscal M.D.   On: 08/15/2018 17:09   Dg Chest Port 1 View  Result Date: 08/15/2018 CLINICAL DATA:  67 year old female with chest pain and weakness. EXAM: PORTABLE  CHEST 1 VIEW COMPARISON:  [facility] 08/11/2018 and earlier. FINDINGS: Portable AP upright view at 1453 hours. Low but improved lung volumes. Normal cardiac size and mediastinal contours. Visualized tracheal air column is within normal limits. Allowing for portable technique the lungs are clear. No pneumothorax. Paucity of bowel gas in the upper abdomen. Visible osseous structures appear intact. IMPRESSION: No acute cardiopulmonary abnormality. Electronically Signed   By: Genevie Ann M.D.   On: 08/15/2018 15:07    Procedures Procedures (including critical care time)  Medications Ordered in ED Medications  HYDROcodone-acetaminophen (NORCO/VICODIN) 5-325 MG per tablet 1 tablet (1 tablet Oral Given 08/15/18 1831)     Initial Impression / Assessment and Plan / ED Course  I have reviewed the triage vital signs and the nursing notes.  Pertinent labs & imaging results that were available during my care of the patient were reviewed by me and considered in my medical decision making (see chart for details).  Clinical Course as of Aug 16 1855  Tue Aug 15, 2018  1543 Patient here for elevated blood sugars and generally not feeling well with pain all over her body.  She said she had a flu shot this year.  Her initial labs are coming back with a low hemoglobin but better than recent testing.  She is not acidotic.   [MB]  3149  I reviewed the patient's work-up with her and explained that we have no explanation for why she has pain everywhere.  She deftly has some lower extremity edema but the duplex did not show any DVT.  Her sugars are greatly out of control at 300 with no gap.  Most of her labs are close to baseline.  Flu test is negative.  She has not had any diarrhea here so she has not given a sample for C. difficile.  She is asking if we can give her something for pain and she said she had been on hydrocodone in the past but she does not know why stopped.  I think it would be reasonable to give  her a dose of hydrocodone and have her return to her facility where they can keep an eye on her.   [MB]  1831 I tried to reach the patient's legal guardian and it sounds like it switched and now is somebody at the facility where she is living at.  I called the facility and they gave me the nurse on call who is accepted the patient back in transfer.   [MB]    Clinical Course User Index [MB] Hayden Rasmussen, MD     Final Clinical Impressions(s) / ED Diagnoses   Final diagnoses:  Hyperglycemia  Peripheral edema  Nausea vomiting and diarrhea  Myalgia    ED Discharge Orders         Ordered    HYDROcodone-acetaminophen (NORCO/VICODIN) 5-325 MG tablet  Every 6 hours PRN     08/15/18 1824           Hayden Rasmussen, MD 08/15/18 [phone]

## 2018-08-15 NOTE — Discharge Instructions (Addendum)
You were seen in the emergency department for elevated blood sugars and body aches with nausea vomiting and diarrhea.  You had blood work, urinalysis, duplex of your lower extremities.  We did not find any obvious explanations for your symptoms.  We are treating your pain with a hydrocodone at your request.  This may be a medication that your facility will allow you to continue to take.  You should follow-up with your doctor soon as possible and return if any worsening symptoms.

## 2018-08-16 ENCOUNTER — Emergency Department (HOSPITAL_COMMUNITY): Payer: Medicare Other

## 2018-08-16 ENCOUNTER — Other Ambulatory Visit: Payer: Self-pay

## 2018-08-16 ENCOUNTER — Inpatient Hospital Stay (HOSPITAL_COMMUNITY): Payer: Medicare Other

## 2018-08-16 ENCOUNTER — Encounter (HOSPITAL_COMMUNITY): Payer: Self-pay

## 2018-08-16 ENCOUNTER — Inpatient Hospital Stay (HOSPITAL_COMMUNITY)
Admission: EM | Admit: 2018-08-16 | Discharge: 2018-08-20 | DRG: 208 | Disposition: A | Discharge status: Home Health | Payer: Medicare Other | Attending: Internal Medicine | Admitting: Internal Medicine

## 2018-08-16 DIAGNOSIS — Z888 Allergy status to other drugs, medicaments and biological substances status: Secondary | ICD-10-CM

## 2018-08-16 DIAGNOSIS — J9602 Acute respiratory failure with hypercapnia: Secondary | ICD-10-CM

## 2018-08-16 DIAGNOSIS — Z7984 Long term (current) use of oral hypoglycemic drugs: Secondary | ICD-10-CM

## 2018-08-16 DIAGNOSIS — Z79899 Other long term (current) drug therapy: Secondary | ICD-10-CM

## 2018-08-16 DIAGNOSIS — I5032 Chronic diastolic (congestive) heart failure: Secondary | ICD-10-CM | POA: Diagnosis present

## 2018-08-16 DIAGNOSIS — Z794 Long term (current) use of insulin: Secondary | ICD-10-CM

## 2018-08-16 DIAGNOSIS — Z885 Allergy status to narcotic agent status: Secondary | ICD-10-CM | POA: Diagnosis not present

## 2018-08-16 DIAGNOSIS — Z87891 Personal history of nicotine dependence: Secondary | ICD-10-CM | POA: Diagnosis not present

## 2018-08-16 DIAGNOSIS — E1142 Type 2 diabetes mellitus with diabetic polyneuropathy: Secondary | ICD-10-CM

## 2018-08-16 DIAGNOSIS — G9340 Encephalopathy, unspecified: Secondary | ICD-10-CM | POA: Diagnosis not present

## 2018-08-16 DIAGNOSIS — E785 Hyperlipidemia, unspecified: Secondary | ICD-10-CM | POA: Diagnosis not present

## 2018-08-16 DIAGNOSIS — Z7401 Bed confinement status: Secondary | ICD-10-CM | POA: Diagnosis not present

## 2018-08-16 DIAGNOSIS — R1310 Dysphagia, unspecified: Secondary | ICD-10-CM | POA: Diagnosis not present

## 2018-08-16 DIAGNOSIS — R410 Disorientation, unspecified: Secondary | ICD-10-CM | POA: Diagnosis not present

## 2018-08-16 DIAGNOSIS — R531 Weakness: Secondary | ICD-10-CM | POA: Diagnosis not present

## 2018-08-16 DIAGNOSIS — R109 Unspecified abdominal pain: Secondary | ICD-10-CM

## 2018-08-16 DIAGNOSIS — Z7982 Long term (current) use of aspirin: Secondary | ICD-10-CM | POA: Diagnosis not present

## 2018-08-16 DIAGNOSIS — I4510 Unspecified right bundle-branch block: Secondary | ICD-10-CM | POA: Diagnosis not present

## 2018-08-16 DIAGNOSIS — F202 Catatonic schizophrenia: Secondary | ICD-10-CM | POA: Diagnosis present

## 2018-08-16 DIAGNOSIS — J9690 Respiratory failure, unspecified, unspecified whether with hypoxia or hypercapnia: Secondary | ICD-10-CM | POA: Diagnosis present

## 2018-08-16 DIAGNOSIS — E1122 Type 2 diabetes mellitus with diabetic chronic kidney disease: Secondary | ICD-10-CM | POA: Diagnosis present

## 2018-08-16 DIAGNOSIS — N184 Chronic kidney disease, stage 4 (severe): Secondary | ICD-10-CM | POA: Diagnosis not present

## 2018-08-16 DIAGNOSIS — F4310 Post-traumatic stress disorder, unspecified: Secondary | ICD-10-CM | POA: Diagnosis not present

## 2018-08-16 DIAGNOSIS — F329 Major depressive disorder, single episode, unspecified: Secondary | ICD-10-CM | POA: Diagnosis not present

## 2018-08-16 DIAGNOSIS — Z79890 Hormone replacement therapy (postmenopausal): Secondary | ICD-10-CM

## 2018-08-16 DIAGNOSIS — Z01818 Encounter for other preprocedural examination: Secondary | ICD-10-CM

## 2018-08-16 DIAGNOSIS — R569 Unspecified convulsions: Secondary | ICD-10-CM | POA: Diagnosis not present

## 2018-08-16 DIAGNOSIS — R4020 Unspecified coma: Secondary | ICD-10-CM | POA: Diagnosis not present

## 2018-08-16 DIAGNOSIS — E039 Hypothyroidism, unspecified: Secondary | ICD-10-CM | POA: Diagnosis present

## 2018-08-16 DIAGNOSIS — J449 Chronic obstructive pulmonary disease, unspecified: Secondary | ICD-10-CM | POA: Diagnosis present

## 2018-08-16 DIAGNOSIS — I272 Pulmonary hypertension, unspecified: Secondary | ICD-10-CM | POA: Diagnosis present

## 2018-08-16 DIAGNOSIS — R442 Other hallucinations: Secondary | ICD-10-CM | POA: Diagnosis not present

## 2018-08-16 DIAGNOSIS — E1165 Type 2 diabetes mellitus with hyperglycemia: Secondary | ICD-10-CM | POA: Diagnosis present

## 2018-08-16 DIAGNOSIS — Z882 Allergy status to sulfonamides status: Secondary | ICD-10-CM

## 2018-08-16 DIAGNOSIS — I130 Hypertensive heart and chronic kidney disease with heart failure and stage 1 through stage 4 chronic kidney disease, or unspecified chronic kidney disease: Secondary | ICD-10-CM | POA: Diagnosis present

## 2018-08-16 DIAGNOSIS — Z6839 Body mass index (BMI) 39.0-39.9, adult: Secondary | ICD-10-CM

## 2018-08-16 DIAGNOSIS — J9601 Acute respiratory failure with hypoxia: Secondary | ICD-10-CM | POA: Diagnosis not present

## 2018-08-16 DIAGNOSIS — Z880 Allergy status to penicillin: Secondary | ICD-10-CM

## 2018-08-16 DIAGNOSIS — Z9049 Acquired absence of other specified parts of digestive tract: Secondary | ICD-10-CM | POA: Diagnosis not present

## 2018-08-16 DIAGNOSIS — G459 Transient cerebral ischemic attack, unspecified: Secondary | ICD-10-CM | POA: Diagnosis not present

## 2018-08-16 DIAGNOSIS — R062 Wheezing: Secondary | ICD-10-CM | POA: Diagnosis not present

## 2018-08-16 DIAGNOSIS — I452 Bifascicular block: Secondary | ICD-10-CM | POA: Diagnosis present

## 2018-08-16 DIAGNOSIS — R4182 Altered mental status, unspecified: Secondary | ICD-10-CM | POA: Diagnosis not present

## 2018-08-16 DIAGNOSIS — J9811 Atelectasis: Secondary | ICD-10-CM | POA: Diagnosis not present

## 2018-08-16 DIAGNOSIS — Z4682 Encounter for fitting and adjustment of non-vascular catheter: Secondary | ICD-10-CM | POA: Diagnosis not present

## 2018-08-16 DIAGNOSIS — R404 Transient alteration of awareness: Secondary | ICD-10-CM | POA: Diagnosis not present

## 2018-08-16 DIAGNOSIS — M2550 Pain in unspecified joint: Secondary | ICD-10-CM | POA: Diagnosis not present

## 2018-08-16 LAB — MRSA PCR SCREENING: MRSA by PCR: NEGATIVE

## 2018-08-16 LAB — TRIGLYCERIDES: Triglycerides: 465 mg/dL — ABNORMAL HIGH (ref <150)

## 2018-08-16 LAB — CBC WITH DIFFERENTIAL/PLATELET
Abs Immature Granulocytes: 0.04 10*3/uL (ref 0.00–0.07)
Abs Immature Granulocytes: 0.05 10*3/uL (ref 0.00–0.07)
Basophils Absolute: 0.1 10*3/uL (ref 0.0–0.1)
Basophils Absolute: 0 10*3/uL (ref 0.0–0.1)
Basophils Relative: 1 %
Basophils Relative: 0 %
Eosinophils Absolute: 0.3 10*3/uL (ref 0.0–0.5)
Eosinophils Absolute: 0.2 10*3/uL (ref 0.0–0.5)
Eosinophils Relative: 3 %
Eosinophils Relative: 2 %
HCT: 29.6 % — ABNORMAL LOW (ref 36.0–46.0)
HCT: 26.4 % — ABNORMAL LOW (ref 36.0–46.0)
Hemoglobin: 9.7 g/dL — ABNORMAL LOW (ref 12.0–15.0)
Hemoglobin: 8.9 g/dL — ABNORMAL LOW (ref 12.0–15.0)
Immature Granulocytes: 0 %
Immature Granulocytes: 1 %
Lymphocytes Relative: 13 %
Lymphocytes Relative: 8 %
Lymphs Abs: 1.4 10*3/uL (ref 0.7–4.0)
Lymphs Abs: 0.9 10*3/uL (ref 0.7–4.0)
MCH: 31.5 pg (ref 26.0–34.0)
MCH: 32.7 pg (ref 26.0–34.0)
MCHC: 32.8 g/dL (ref 30.0–36.0)
MCHC: 33.7 g/dL (ref 30.0–36.0)
MCV: 96.1 fL (ref 80.0–100.0)
MCV: 97.1 fL (ref 80.0–100.0)
Monocytes Absolute: 0.8 10*3/uL (ref 0.1–1.0)
Monocytes Absolute: 1.1 10*3/uL — ABNORMAL HIGH (ref 0.1–1.0)
Monocytes Relative: 8 %
Monocytes Relative: 10 %
Neutro Abs: 8.1 10*3/uL — ABNORMAL HIGH (ref 1.7–7.7)
Neutro Abs: 8.5 10*3/uL — ABNORMAL HIGH (ref 1.7–7.7)
Neutrophils Relative %: 75 %
Neutrophils Relative %: 79 %
Platelets: 217 10*3/uL (ref 150–400)
Platelets: 205 10*3/uL (ref 150–400)
RBC: 3.08 MIL/uL — ABNORMAL LOW (ref 3.87–5.11)
RBC: 2.72 MIL/uL — ABNORMAL LOW (ref 3.87–5.11)
RDW: 14.1 % (ref 11.5–15.5)
RDW: 14.1 % (ref 11.5–15.5)
WBC: 10.7 10*3/uL — ABNORMAL HIGH (ref 4.0–10.5)
WBC: 10.8 10*3/uL — ABNORMAL HIGH (ref 4.0–10.5)
nRBC: 0 % (ref 0.0–0.2)
nRBC: 0 % (ref 0.0–0.2)

## 2018-08-16 LAB — URINALYSIS, COMPLETE (UACMP) WITH MICROSCOPIC
Bilirubin Urine: NEGATIVE
Glucose, UA: 150 mg/dL — AB
Ketones, ur: NEGATIVE mg/dL
Leukocytes, UA: NEGATIVE
Nitrite: NEGATIVE
Protein, ur: >=300 mg/dL — AB
Specific Gravity, Urine: 1.015 (ref 1.005–1.030)
pH: 5 (ref 5.0–8.0)

## 2018-08-16 LAB — COMPREHENSIVE METABOLIC PANEL
ALT: 28 U/L (ref 0–44)
ALT: 32 U/L (ref 0–44)
AST: 23 U/L (ref 15–41)
AST: 26 U/L (ref 15–41)
Albumin: 3.2 g/dL — ABNORMAL LOW (ref 3.5–5.0)
Albumin: 2.7 g/dL — ABNORMAL LOW (ref 3.5–5.0)
Alkaline Phosphatase: 75 U/L (ref 38–126)
Alkaline Phosphatase: 70 U/L (ref 38–126)
Anion gap: 9 (ref 5–15)
Anion gap: 8 (ref 5–15)
BUN: 38 mg/dL — ABNORMAL HIGH (ref 8–23)
BUN: 34 mg/dL — ABNORMAL HIGH (ref 8–23)
CO2: 22 mmol/L (ref 22–32)
CO2: 26 mmol/L (ref 22–32)
Calcium: 8.7 mg/dL — ABNORMAL LOW (ref 8.9–10.3)
Calcium: 8.4 mg/dL — ABNORMAL LOW (ref 8.9–10.3)
Chloride: 105 mmol/L (ref 98–111)
Chloride: 106 mmol/L (ref 98–111)
Creatinine, Ser: 2.38 mg/dL — ABNORMAL HIGH (ref 0.44–1.00)
Creatinine, Ser: 2.33 mg/dL — ABNORMAL HIGH (ref 0.44–1.00)
GFR calc Af Amer: 24 mL/min — ABNORMAL LOW (ref >60)
GFR calc Af Amer: 24 mL/min — ABNORMAL LOW (ref >60)
GFR calc non Af Amer: 21 mL/min — ABNORMAL LOW (ref >60)
GFR calc non Af Amer: 21 mL/min — ABNORMAL LOW (ref >60)
Glucose, Bld: 324 mg/dL — ABNORMAL HIGH (ref 70–99)
Glucose, Bld: 275 mg/dL — ABNORMAL HIGH (ref 70–99)
Potassium: 3.9 mmol/L (ref 3.5–5.1)
Potassium: 3.7 mmol/L (ref 3.5–5.1)
Sodium: 136 mmol/L (ref 135–145)
Sodium: 140 mmol/L (ref 135–145)
Total Bilirubin: 0.5 mg/dL (ref 0.3–1.2)
Total Bilirubin: 0.1 mg/dL — ABNORMAL LOW (ref 0.3–1.2)
Total Protein: 6.6 g/dL (ref 6.5–8.1)
Total Protein: 5.6 g/dL — ABNORMAL LOW (ref 6.5–8.1)

## 2018-08-16 LAB — RAPID URINE DRUG SCREEN, HOSP PERFORMED
Amphetamines: NOT DETECTED
Barbiturates: NOT DETECTED
Benzodiazepines: NOT DETECTED
Cocaine: NOT DETECTED
Opiates: NOT DETECTED
Tetrahydrocannabinol: NOT DETECTED

## 2018-08-16 LAB — URINALYSIS, ROUTINE W REFLEX MICROSCOPIC
Bilirubin Urine: NEGATIVE
Glucose, UA: >=500 mg/dL — AB
Hgb urine dipstick: NEGATIVE
Ketones, ur: NEGATIVE mg/dL
Leukocytes, UA: NEGATIVE
Nitrite: NEGATIVE
Protein, ur: >=300 mg/dL — AB
Specific Gravity, Urine: 1.011 (ref 1.005–1.030)
pH: 5 (ref 5.0–8.0)

## 2018-08-16 LAB — BLOOD GAS, ARTERIAL
Acid-base deficit: 1.7 mmol/L (ref 0.0–2.0)
Bicarbonate: 22.4 mmol/L (ref 20.0–28.0)
Drawn by: 419771
FIO2: 100
MECHVT: 420 mL
O2 Saturation: 100 %
PEEP: 5 cmH2O
Patient temperature: 37.2
RATE: 18 resp/min
pCO2 arterial: 50.9 mmHg — ABNORMAL HIGH (ref 32.0–48.0)
pH, Arterial: 7.294 — ABNORMAL LOW (ref 7.350–7.450)
pO2, Arterial: 298 mmHg — ABNORMAL HIGH (ref 83.0–108.0)

## 2018-08-16 LAB — GLUCOSE, CAPILLARY
Glucose-Capillary: 135 mg/dL — ABNORMAL HIGH (ref 70–99)
Glucose-Capillary: 155 mg/dL — ABNORMAL HIGH (ref 70–99)
Glucose-Capillary: 210 mg/dL — ABNORMAL HIGH (ref 70–99)
Glucose-Capillary: 237 mg/dL — ABNORMAL HIGH (ref 70–99)
Glucose-Capillary: 239 mg/dL — ABNORMAL HIGH (ref 70–99)
Glucose-Capillary: 92 mg/dL (ref 70–99)

## 2018-08-16 LAB — I-STAT CHEM 8, ED
BUN: 34 mg/dL — ABNORMAL HIGH (ref 8–23)
Calcium, Ion: 1.14 mmol/L — ABNORMAL LOW (ref 1.15–1.40)
Chloride: 105 mmol/L (ref 98–111)
Creatinine, Ser: 2.4 mg/dL — ABNORMAL HIGH (ref 0.44–1.00)
Glucose, Bld: 325 mg/dL — ABNORMAL HIGH (ref 70–99)
HCT: 27 % — ABNORMAL LOW (ref 36.0–46.0)
Hemoglobin: 9.2 g/dL — ABNORMAL LOW (ref 12.0–15.0)
Potassium: 4.1 mmol/L (ref 3.5–5.1)
Sodium: 137 mmol/L (ref 135–145)
TCO2: 23 mmol/L (ref 22–32)

## 2018-08-16 LAB — BETA-HYDROXYBUTYRIC ACID: Beta-Hydroxybutyric Acid: 0.09 mmol/L (ref 0.05–0.27)

## 2018-08-16 LAB — I-STAT CG4 LACTIC ACID, ED
Lactic Acid, Venous: 2.93 mmol/L (ref 0.5–1.9)
Lactic Acid, Venous: 1.78 mmol/L (ref 0.5–1.9)

## 2018-08-16 LAB — ETHANOL: Alcohol, Ethyl (B): <10 mg/dL (ref <10)

## 2018-08-16 LAB — I-STAT TROPONIN, ED: Troponin i, poc: 0.01 ng/mL (ref 0.00–0.08)

## 2018-08-16 LAB — AMMONIA: Ammonia: 19 umol/L (ref 9–35)

## 2018-08-16 LAB — CBG MONITORING, ED: Glucose-Capillary: 282 mg/dL — ABNORMAL HIGH (ref 70–99)

## 2018-08-16 MED ORDER — ATORVASTATIN CALCIUM 40 MG PO TABS
40.0000 mg | ORAL_TABLET | Freq: Every day | ORAL | Status: DC
  Administered 2018-08-16 – 2018-08-19 (×4): 40 mg via ORAL
  Filled 2018-08-16 (×4): qty 1

## 2018-08-16 MED ORDER — CHLORHEXIDINE GLUCONATE 0.12% ORAL RINSE (MEDLINE KIT)
15.0000 mL | Freq: Two times a day (BID) | OROMUCOSAL | Status: DC
  Administered 2018-08-16: 15 mL via OROMUCOSAL

## 2018-08-16 MED ORDER — INSULIN ASPART 100 UNIT/ML ~~LOC~~ SOLN
2.0000 [IU] | SUBCUTANEOUS | Status: DC
Start: 2018-08-16 — End: 2018-08-17
  Administered 2018-08-16 (×2): 6 [IU] via SUBCUTANEOUS
  Administered 2018-08-16: 4 [IU] via SUBCUTANEOUS
  Administered 2018-08-16 – 2018-08-17 (×2): 2 [IU] via SUBCUTANEOUS

## 2018-08-16 MED ORDER — ENOXAPARIN SODIUM 30 MG/0.3ML ~~LOC~~ SOLN: 30.0000 mg | SUBCUTANEOUS | Status: DC

## 2018-08-16 MED ORDER — LEVOTHYROXINE SODIUM 25 MCG PO TABS
25.0000 ug | ORAL_TABLET | Freq: Every day | ORAL | Status: DC
  Administered 2018-08-17 – 2018-08-20 (×4): 25 ug via ORAL
  Filled 2018-08-16 (×4): qty 1

## 2018-08-16 MED ORDER — PROPOFOL 1000 MG/100ML IV EMUL
0.0000 ug/kg/min | INTRAVENOUS | Status: DC
  Administered 2018-08-16: 45 ug/kg/min via INTRAVENOUS
  Filled 2018-08-16: qty 200

## 2018-08-16 MED ORDER — FAMOTIDINE 40 MG/5ML PO SUSR: 20.0000 mg | Freq: Every day | ORAL | Status: DC

## 2018-08-16 MED ORDER — IPRATROPIUM-ALBUTEROL 0.5-2.5 (3) MG/3ML IN SOLN: 3.0000 mL | Freq: Four times a day (QID) | RESPIRATORY_TRACT | Status: DC | PRN

## 2018-08-16 MED ORDER — AMLODIPINE BESYLATE 5 MG PO TABS
5.0000 mg | ORAL_TABLET | Freq: Every day | ORAL | Status: DC
  Administered 2018-08-16 – 2018-08-20 (×5): 5 mg via ORAL
  Filled 2018-08-16 (×5): qty 1

## 2018-08-16 MED ORDER — ETOMIDATE 2 MG/ML IV SOLN
INTRAVENOUS | Status: AC | PRN
  Administered 2018-08-16: 20 mg via INTRAVENOUS

## 2018-08-16 MED ORDER — PROPOFOL 1000 MG/100ML IV EMUL
5.0000 ug/kg/min | INTRAVENOUS | Status: DC
  Administered 2018-08-16: 5 ug/kg/min via INTRAVENOUS
  Filled 2018-08-16: qty 100

## 2018-08-16 MED ORDER — LEVETIRACETAM IN NACL 500 MG/100ML IV SOLN
500.0000 mg | Freq: Two times a day (BID) | INTRAVENOUS | Status: DC
  Administered 2018-08-16 – 2018-08-17 (×2): 500 mg via INTRAVENOUS
  Filled 2018-08-16 (×2): qty 100

## 2018-08-16 MED ORDER — LEVETIRACETAM IN NACL 1000 MG/100ML IV SOLN: 1000.0000 mg | Freq: Two times a day (BID) | INTRAVENOUS | Status: DC

## 2018-08-16 MED ORDER — ENOXAPARIN SODIUM 40 MG/0.4ML ~~LOC~~ SOLN
40.0000 mg | SUBCUTANEOUS | Status: DC
  Administered 2018-08-16 – 2018-08-18 (×3): 40 mg via SUBCUTANEOUS
  Filled 2018-08-16 (×3): qty 0.4

## 2018-08-16 MED ORDER — LEVETIRACETAM IN NACL 1000 MG/100ML IV SOLN
1000.0000 mg | Freq: Once | INTRAVENOUS | Status: AC
  Administered 2018-08-16: 1000 mg via INTRAVENOUS
  Filled 2018-08-16: qty 100

## 2018-08-16 MED ORDER — ORAL CARE MOUTH RINSE: 15.0000 mL | OROMUCOSAL | Status: DC

## 2018-08-16 MED ORDER — ASPIRIN 81 MG PO CHEW
81.0000 mg | CHEWABLE_TABLET | Freq: Every day | ORAL | Status: DC
  Administered 2018-08-16 – 2018-08-20 (×5): 81 mg via ORAL
  Filled 2018-08-16 (×5): qty 1

## 2018-08-16 MED ORDER — SUCCINYLCHOLINE CHLORIDE 20 MG/ML IJ SOLN
INTRAMUSCULAR | Status: AC | PRN
  Administered 2018-08-16: 100 mg via INTRAVENOUS

## 2018-08-16 MED ORDER — PROPOFOL 1000 MG/100ML IV EMUL
INTRAVENOUS | Status: AC
  Administered 2018-08-16: 5 ug/kg/min via INTRAVENOUS
  Filled 2018-08-16: qty 100

## 2018-08-16 MED ORDER — SODIUM CHLORIDE 0.9 % IV BOLUS
1000.0000 mL | Freq: Once | INTRAVENOUS | Status: AC
  Administered 2018-08-16: 1000 mL via INTRAVENOUS

## 2018-08-16 MED ORDER — IPRATROPIUM-ALBUTEROL 0.5-2.5 (3) MG/3ML IN SOLN
3.0000 mL | Freq: Four times a day (QID) | RESPIRATORY_TRACT | Status: DC
  Administered 2018-08-16: 3 mL via RESPIRATORY_TRACT
  Filled 2018-08-16: qty 3

## 2018-08-16 MED ORDER — HALOPERIDOL 0.5 MG PO TABS
0.5000 mg | ORAL_TABLET | Freq: Two times a day (BID) | ORAL | Status: DC
  Administered 2018-08-16 – 2018-08-20 (×9): 0.5 mg
  Filled 2018-08-16 (×9): qty 1

## 2018-08-16 MED ORDER — FAMOTIDINE IN NACL 20-0.9 MG/50ML-% IV SOLN
20.0000 mg | Freq: Two times a day (BID) | INTRAVENOUS | Status: DC
  Administered 2018-08-16: 20 mg via INTRAVENOUS
  Filled 2018-08-16: qty 50

## 2018-08-16 MED ORDER — SODIUM CHLORIDE 0.9 % IV SOLN
INTRAVENOUS | Status: DC
  Administered 2018-08-16 – 2018-08-17 (×4): via INTRAVENOUS

## 2018-08-16 NOTE — ED Triage Notes (Signed)
Pt is resident of [facility].  Ems was called out for "altered mental status".   Pt arrives awake and alert, but will not respond to verbal.

## 2018-08-16 NOTE — Progress Notes (Signed)
Per MD ETT tube pulled back 1cm.

## 2018-08-16 NOTE — Procedures (Signed)
Extubation Procedure Note  Patient Details:   Name: [name] DOB: 1951-07-04 MRN: [id number]   Airway Documentation:    Vent end date: 08/16/18 Vent end time: 1045   Evaluation  O2 sats: stable throughout Complications: No apparent complications Patient did tolerate procedure well. Bilateral Breath Sounds: Diminished   Yes   Pt extubated to 2L N/C.  No stridor noted.  RN @ bedside.  Donnetta Hail 08/16/2018, 10:46 AM

## 2018-08-16 NOTE — H&P (Addendum)
PULMONARY / CRITICAL CARE MEDICINE   NAME:  [name], MRN:  [id number], DOB:  11-27-1950, LOS: 0 ADMISSION DATE:  08/16/2018, CONSULTATION DATE: 08/16/2018 REFERRING MD: [facility], CHIEF COMPLAINT: Altered mental status  BRIEF HISTORY:    68 year old lady with history of schizophrenia among other medical problems coming in with altered mental status and suspected seizure intubated for airway protection   HISTORY OF PRESENT ILLNESS   [name] is a 68 year old lady with history of schizophrenia hypothyroidism diabetes mellitus type 2 chronic diastolic congestive heart failure presented with altered mental status agitation to [facility] during her stay she started screaming complaining of " seeing monsters" and then she became unresponsive with deviation of her eyes and had to be intubated for airway protection and desaturation there was concern of possible seizure and was transferred to our hospital for further management.  Patient came in to our hospital sedated on propofol no visible seizures and obviously not able to get any history from the patient since her sedation and no family is available.  CT scan of the head did not show any acute event.  Patient had similar presentation mid of December where she was intubated there was no signs of seizures on her EEG with negative CT scan of her head and was concerned about polypharmacy causing her acute encephalopathy or catatonia. SIGNIFICANT PAST MEDICAL HISTORY    -Hypothyroidism -Schizophrenia -Depression -Chronic diastolic congestive heart failure -Chronic kidney disease stage IV -Diabetes mellitus type 2  SIGNIFICANT EVENTS:   STUDIES:   CT head on 08/16/2018  1. No acute intracranial pathology seen on CT. 2. Mild small vessel ischemic microangiopathy. 3. 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 4. Mucosal thickening at the maxillary sinuses bilaterally, with a likely mucus retention cyst or polyp at  the left maxillary sinus. CULTURES:    ANTIBIOTICS:  None  LINES/TUBES:    CONSULTANTS:   SUBJECTIVE:    CONSTITUTIONAL: BP (!) 163/71   Pulse 87   Temp 98.6 F (37 C)   Resp 20   Wt 100.9 kg   SpO2 100%   BMI 39.40 kg/m   No intake/output data recorded.     Vent Mode: PRVC FiO2 (%):  [40 %-100 %] 40 % Set Rate:  [18 bmp-20 bmp] 20 bmp Vt Set:  [420 mL] 420 mL PEEP:  [5 cmH20] 5 cmH20 Plateau Pressure:  [16 WER15-40 cmH20] 19 cmH20  PHYSICAL EXAM: General: Sedated not following commands intubated obese Neuro: Pupils are 3 mm reactive sedated not following any commands not moving any of her extremities HEENT: Endotracheal tube in position Cardiovascular: Normal heart sound no added sounds or murmurs Lungs: Clear equal air sounds bilaterally no crackles no wheezing Abdomen: Soft no tenderness no organomegaly Musculoskeletal: Bilateral lower limb edema with mild redness over her shins suspected chronic venous insufficiency Skin: Redness over her shins bilaterally  RESOLVED PROBLEM LIST   ASSESSMENT AND PLAN   Assessment: -Acute hypoxemic respiratory failure require mechanical ventilation for airway protection -Suspected seizure -Chronic kidney disease -Acute metabolic encephalopathy  Plan: -Admit to the intensive care unit for further management -Wean down propofol to assess mental status -EEG - I will hold off on starting anti seizure meds since it was not clear and had a similar presentation in the recent past.  -Consider neurology consult in the morning -Recommend psych consultation for psychiatric med reconciliation -Consider MRI of the brain if patient mental status does not improve with weaning sedation -Normal saline at  75 mL an hour -Adjust mechanical ventilation and use lung protective strategy keep pulse ox above 92% -Insulin sliding scale -Resume levothyroxine -Resume amlodipine   I have spent 45 minutes of critical care time this time  was spent bedside or in the unit this time was exclusive of any billable procedures patient is needing intensive care due to acute hypoxemic respiratory failure requiring mechanical ventilation  SUMMARY OF TODAY'S PLAN:    Best Practice / Goals of Care / Disposition.   DVT PROPHYLAXIS: Lovenox SUP: PPI NUTRITION: N.p.o. MOBILITY: Bedrest GOALS OF CARE: Full code FAMILY DISCUSSIONS: No family available DISPOSITION ICU admission  LABS  Glucose Recent Labs  Lab 08/15/18 1450 08/16/18 0058 08/16/18 0550  GLUCAP 289* 282* 239*    BMET Recent Labs  Lab 08/15/18 1503 08/16/18 0128 08/16/18 0150  NA 135 137 136  K 4.1 4.1 3.9  CL 104 105 105  CO2 23  --  22  BUN 35* 34* 38*  CREATININE 2.37* 2.40* 2.38*  GLUCOSE 300* 325* 324*    Liver Enzymes Recent Labs  Lab 08/15/18 1503 08/16/18 0150  AST 22 23  ALT 30 28  ALKPHOS 74 75  BILITOT 0.4 0.5  ALBUMIN 3.2* 3.2*    Electrolytes Recent Labs  Lab 08/15/18 1503 08/16/18 0150  CALCIUM 8.7* 8.7*    CBC Recent Labs  Lab 08/15/18 1503 08/16/18 0128 08/16/18 0150  WBC 8.9  --  10.7*  HGB 9.4* 9.2* 9.7*  HCT 28.0* 27.0* 29.6*  PLT 205  --  217    ABG Recent Labs  Lab 08/16/18 0301  PHART 7.294*  PCO2ART 50.9*  PO2ART 298*    Coag's No results for input(s): APTT, INR in the last 168 hours.  Sepsis Markers Recent Labs  Lab 08/16/18 0129 08/16/18 0330  LATICACIDVEN 2.93* 1.78    Cardiac Enzymes Recent Labs  Lab 08/15/18 1503  TROPONINI <0.03    PAST MEDICAL HISTORY :   She  has a past medical history of Anxiety, Asthma, Back pain, Barrett esophagus, CHF (congestive heart failure) (HCC), CKD (chronic kidney disease) stage 3, GFR 30-59 ml/min (HCC), COPD (chronic obstructive pulmonary disease) ([place]), Depression, Diastolic heart failure ([place]), Essential hypertension, Fibromyalgia, GERD (gastroesophageal reflux disease), Gout, History of pericarditis, Hyperlipidemia, Hypothyroid, Major  depressive disorder, Memory loss, Nephrolithiasis, Obesity hypoventilation syndrome ([place]), Obstructive sleep apnea, PE (pulmonary embolism), Peripheral neuropathy, PTSD (post-traumatic stress disorder), Rheumatoid arthritis ([place]), Schizophrenia ([place]), Steroid dependence, Type 2 diabetes mellitus ([place]), and Vitamin D deficiency.  PAST SURGICAL HISTORY:  She  has a past surgical history that includes Colonoscopy; Endometrial biopsy; Appendectomy; Cholecystectomy; and Kidney surgery.  Allergies  Allergen Reactions  . Benztropine Mesylate Other (See Comments)    Alopecia and white spots on eyes  . Codeine Swelling, Palpitations and Other (See Comments)    Mouth Swelling and Tachycardia   . Diazepam Other (See Comments)    COMA  . Diphenhydramine Hcl Anxiety, Palpitations and Other (See Comments)    Tachycardia and anxiousness  . Penicillins Other (See Comments)    REACTION: ulcers in mouth, swelling, throat swelling  Has patient had a PCN reaction causing immediate rash, facial/tongue/throat swelling, SOB or lightheadedness with hypotension: YES Has patient had a PCN reaction causing severe rash involving Mucus membranes or skin necrosis: Unknown Has patient had a PCN reaction that required hospitalization Unknown Has patient had a PCN reaction occurring within the last 10 years: unknown If all of the above answers are "NO", then may proceed with Cephalospori  .  Sulfonamide Derivatives Rash    Blisters, also  . Thioridazine Hcl Swelling  . Benztropine Other (See Comments)    "Alopecia and white spots on eyes"  . Penicillin G Swelling and Other (See Comments)    Ulcers in mouth & tongue became swollen Has patient had a PCN reaction causing immediate rash, facial/tongue/throat swelling, SOB or lightheadedness with hypotension: Yes Has patient had a PCN reaction causing severe rash involving mucus membranes or skin necrosis: Unk Has patient had a PCN reaction that required hospitalization:  Unk Has patient had a PCN reaction occurring within the last 10 years: Unk If all of the above answers are "NO", then may proceed with Cephalosporin use.   [name] Kitchen Lisinopril Cough  . Sulfamethoxazole Rash    No current facility-administered medications on file prior to encounter.    Current Outpatient Medications on File Prior to Encounter  Medication Sig  . acetaminophen (TYLENOL) 325 MG tablet Take 325 mg by mouth 2 (two) times daily.   [name] Kitchen albuterol (PROVENTIL HFA;VENTOLIN HFA) 108 (90 Base) MCG/ACT inhaler Inhale 2 puffs into the lungs every 6 (six) hours as needed for wheezing or shortness of breath.  [name] Kitchen albuterol (PROVENTIL) (2.5 MG/3ML) 0.083% nebulizer solution Take 2.5 mg by nebulization every 6 (six) hours as needed for wheezing.   [name] Kitchen amLODipine (NORVASC) 5 MG tablet Take 1 tablet (5 mg total) by mouth daily.  [name] Kitchen aspirin 81 MG tablet Take 81 mg by mouth daily.  [name] Kitchen atorvastatin (LIPITOR) 40 MG tablet Take 40 mg by mouth at bedtime.   . cephALEXin (KEFLEX) 500 MG capsule Take 500 mg by mouth 3 (three) times daily. 10 day course starting on 08/10/2018  . Cholecalciferol (VITAMIN D3) 50 MCG (2000 UT) TABS Take 4,000 Units by mouth daily.  [name] Kitchen docusate sodium (COLACE) 100 MG capsule Take 100 mg by mouth daily.   [name] Kitchen EPINEPHrine (EPI-PEN) 0.3 mg/0.3 mL DEVI Inject 0.3 mg into the muscle once as needed (for bee stings, then go to the ED immediately after using).   [name] Kitchen glipiZIDE (GLUCOTROL XL) 5 MG 24 hr tablet Take 5 mg by mouth 2 (two) times daily.   . Glycerin, Laxative, (FLEET LIQUID GLYCERIN SUPP RE) Place 1 suppository rectally daily as needed (for constipation).  . haloperidol (HALDOL) 0.5 MG tablet Take 1 tablet (0.5 mg total) by mouth 2 (two) times daily.  [name] Kitchen HYDROcodone-acetaminophen (NORCO/VICODIN) 5-325 MG tablet Take 1-2 tablets by mouth every 6 (six) hours as needed for moderate pain or severe pain.  . hydrocortisone 2.5 % ointment Apply 1 application topically daily as needed (itchy skin).   . insulin degludec (TRESIBA FLEXTOUCH) 100 UNIT/ML SOPN FlexTouch Pen Inject 20 Units into the skin at bedtime.  [name] Kitchen linagliptin (TRADJENTA) 5 MG TABS tablet Take 5 mg by mouth daily.  [name] Kitchen loperamide (IMODIUM) 2 MG capsule Take 2 mg by mouth 3 (three) times daily as needed (for diarrhea).   . montelukast (SINGULAIR) 10 MG tablet Take 10 mg by mouth at bedtime.  . Multiple Vitamin (DAILY VITE PO) Take 1 tablet by mouth daily.  [name] Kitchen omeprazole (PRILOSEC) 20 MG capsule Take 20 mg by mouth daily.   . polyethylene glycol powder (GLYCOLAX/MIRALAX) powder Take 17 g by mouth daily as needed (for constipation).  . pyridOXINE (VITAMIN B-6) 100 MG tablet Take 200 mg by mouth 2 (two) times daily.  [name] Kitchen spironolactone (ALDACTONE) 25 MG tablet Take 1 tablet (25 mg total) by mouth daily.  [name] Kitchen torsemide (DEMADEX) 20 MG tablet Take 60 mg alternating  with 40 mg daily. (Patient taking differently: Take 40-60 mg by mouth See admin instructions. Alternate taking 40mg  with 60mg  every other day (40mg , 60mg , 40mg , etc))  . vancomycin (VANCOCIN) 250 MG capsule Take 250 mg by mouth 4 (four) times daily. 10 day course starting on 08/11/2018    FAMILY HISTORY:   Her family history includes Bone cancer in her mother; COPD in her father; Heart disease in her father and mother; Hypertension in her mother; Stroke in her father. Could not be obtained due to patient altered mental status and this information was from her chart  SOCIAL HISTORY:  She  reports that she quit smoking about 46 years ago. Her smoking use included cigarettes. She has never used smokeless tobacco. She reports that she does not drink alcohol or use drugs. Could not be obtained due to patient altered mental status and sedation this information was from her chart.  REVIEW OF SYSTEMS:    Could not be obtained due to patient altered mental status

## 2018-08-16 NOTE — Progress Notes (Signed)
[facility] follow up rounds 08/16/2018  S: Admitted overnight after becoming unresponsive in ER at [facility].  Had initial chief complaint of hallucinations  O:  Vitals:   08/16/18 0821 08/16/18 0823  BP:  (!) 161/70  Pulse:  86  Resp:  (!) 22  Temp:    SpO2: 100% 100%    Vent Mode: CPAP;PSV FiO2 (%):  [40 %-100 %] 40 % Set Rate:  [18 bmp-20 bmp] 20 bmp Vt Set:  [420 mL] 420 mL PEEP:  [5 cmH20] 5 cmH20 Pressure Support:  [5 cmH20] 5 cmH20 Plateau Pressure:  [16 cmH20-28 cmH20] 16 cmH20   Intake/Output Summary (Last 24 hours) at 08/16/2018 0902 Last data filed at 08/16/2018 0086 Gross per 24 hour  Intake 1391.93 ml  Output 450 ml  Net 941.93 ml    General:  In bed on vent HENT: NCAT thick neck ETT in place PULM: CTA B, vent supported breathing CV: RRR, no mgr GI: BS+, soft, nontender MSK: normal bulk and tone Neuro: sedated on vent but will wake to voice, open eyes appropriately and nods head appropriately    CBC    Component Value Date/Time   WBC 10.8 (H) 08/16/2018 0651   RBC 2.72 (L) 08/16/2018 0651   HGB 8.9 (L) 08/16/2018 0651   HCT 26.4 (L) 08/16/2018 0651   PLT 205 08/16/2018 0651   MCV 97.1 08/16/2018 0651   MCH 32.7 08/16/2018 0651   MCHC 33.7 08/16/2018 0651   RDW 14.1 08/16/2018 0651   LYMPHSABS 0.9 08/16/2018 0651   MONOABS 1.1 (H) 08/16/2018 0651   EOSABS 0.2 08/16/2018 0651   BASOSABS 0.0 08/16/2018 0651   BMET    Component Value Date/Time   NA 140 08/16/2018 0651   K 3.7 08/16/2018 0651   CL 106 08/16/2018 0651   CO2 26 08/16/2018 0651   GLUCOSE 275 (H) 08/16/2018 0651   BUN 34 (H) 08/16/2018 0651   CREATININE 2.33 (H) 08/16/2018 0651   CREATININE 2.54 (H) 08/02/2018 1411   CALCIUM 8.4 (L) 08/16/2018 0651   GFRNONAA 21 (L) 08/16/2018 0651   GFRAA 24 (L) 08/16/2018 7619    Records from her last hospitalization reviewed where she was seen for a similar presentation and she had an MRI brain and EEG, neurology and psyche consult.  No clear  etiology identified, risperdone was changed to haldol.  Echo 12/2017 showed mild pulmonary hypertension  EKG showed right and left bundle branch blocks   Impression: Acute loss of consciousness: concern from ED physician of seizure activity, previous work up unrevealing.  Consider cardiac etiology given pulmonary hypertension on Echo in 12/2017 and bundle branch blocks on EKG > Keppra 500mg  bid  > EEG > neurology consulted  Pulmonary hypertension > will need sleep study as she is high risk for OSA  LBBB/RBBB > tele > consider cardiology evaluation this admission if EEG unrevealing  Acute respiratory failure with hypoxemia > wean to extubate this morning  Schizophrenia > restart haldol/home psyche medications  DM2 > SSI  Discussed with neurology  My cc time 40  minutes  Roselie Awkward, MD [place] [facility] Pager: [phone] Cell: [phone] If no response, call [phone]

## 2018-08-16 NOTE — Progress Notes (Signed)
Bedside EEG completed, results pending. 

## 2018-08-16 NOTE — Progress Notes (Signed)
[facility]  Noted to be on cefazolin and oral vancomycin but we have no documentation stating why she is on these medicines.  She has some mild redness over calves but doesn't appear to be cellulitis to me.  No diarrhea  Hold both antibiotics and monitor  Roselie Awkward, MD [place] [facility] Pager: [phone] Cell: [phone] If no response, call [phone]

## 2018-08-16 NOTE — Progress Notes (Signed)
Adjusting famotidine:  CrCr<30. We will reduce dose to 20mg  per tube qday.  Onnie Boer, PharmD, BCIDP, AAHIVP, CPP Infectious Disease Pharmacist 08/16/2018 2:32 PM

## 2018-08-16 NOTE — Progress Notes (Signed)
Patient transported from room 1 to CT and back to room 2 without any complications.

## 2018-08-16 NOTE — Progress Notes (Signed)
eLink Physician-Brief Progress Note Patient Name: [name] DOB: 12/01/1950 MRN: [id number]   Date of Service  08/16/2018  HPI/Events of Note  68 yo female with PMH of Psych issues. Sent to ED from [facility] d/t ALOC. Initially agitated and hallucinating. Seizure? Became obtunded and hypoxic Intubated and ventilated. Transfer to [facility] requested for neurological work up. VSS.   eICU Interventions  No new orders.      Intervention Category Evaluation Type: New Patient Evaluation  Lysle Dingwall 08/16/2018, 5:43 AM

## 2018-08-16 NOTE — ED Provider Notes (Addendum)
[facility] EMERGENCY DEPARTMENT Provider Note   CSN: [id number] Arrival date & time: 08/16/18  0054     History   Chief Complaint Chief Complaint  Patient presents with  . Altered Mental Status    HPI Acelynn Dejonge is a 68 y.o. female.  Patient [facility] the emergency department [facility].  Patient was seen here in this ER earlier today.  At that time she was complaining of diffuse body pain and was noted to have elevated blood sugars.  She was treated and released.  At some point tonight staff found her extremely altered.  They report that they were unable to arouse her and called EMS.  EMS reports that upon their arrival to the scene she was agitated and confused.  Upon arrival to the ER here she is moaning and crying stating that she is seeing monsters.     Past Medical History:  Diagnosis Date  . Anxiety   . Asthma   . Back pain   . Barrett esophagus   . CHF (congestive heart failure) ([place])   . CKD (chronic kidney disease) stage 3, GFR 30-59 ml/min (HCC)   . COPD (chronic obstructive pulmonary disease) ([place])   . Depression   . Diastolic heart failure ([place])   . Essential hypertension   . Fibromyalgia   . GERD (gastroesophageal reflux disease)   . Gout   . History of pericarditis   . Hyperlipidemia   . Hypothyroid   . Major depressive disorder   . Memory loss   . Nephrolithiasis    2008  . Obesity hypoventilation syndrome (HCC)    CPAP  . Obstructive sleep apnea   . PE (pulmonary embolism)    2010  . Peripheral neuropathy   . PTSD (post-traumatic stress disorder)   . Rheumatoid arthritis ([place])   . Schizophrenia ([place])   . Steroid dependence   . Type 2 diabetes mellitus ([place])   . Vitamin D deficiency     Patient Active Problem List   Diagnosis Date Noted  . Acute encephalopathy 07/27/2018  . Endotracheally intubated 07/27/2018  . Hypothyroidism 07/27/2018  . Pleuritic chest pain 07/14/2018  . Moderate persistent  asthma without complication   . Type 2 diabetes mellitus ([place]) 04/19/2018  . Acute kidney injury superimposed on chronic kidney disease ([place]) 01/13/2018  . Altered mental status   . Encephalopathy 10/11/2016  . Chest pain 08/12/2016  . CKD (chronic kidney disease), stage IV ([place]) 08/12/2016  . History of pulmonary embolus (PE) 08/12/2016  . Obesity, Class III, BMI 40-49.9 (morbid obesity) ([place]) 08/12/2016  . RBBB 08/12/2016  . Positive D dimer 08/12/2016  . CVA (cerebral infarction) 11/10/2015  . Physical deconditioning 07/01/2011  . Weakness 07/01/2011  . Diabetic foot ulcer ([place]) 05/19/2011  . Polypharmacy 02/09/2011  . BACK PAIN WITH RADICULOPATHY 05/25/2010  . Type 2 diabetes mellitus with diabetic polyneuropathy, without long-term current use of insulin ([place]) 08/18/2009  . Chronic diastolic heart failure ([place]) 04/25/2009  . Normocytic anemia 04/14/2009  . Sleep apnea 12/11/2008  . Diabetic peripheral neuropathy ([place]) 05/04/2007  . HYPERCHOLESTEROLEMIA 10/13/2006  . Gout, unspecified 10/13/2006  . HYPOKALEMIA 10/13/2006  . Schizophrenia ([place]) 10/13/2006  . Depression 10/13/2006  . Essential hypertension 10/13/2006  . Venous (peripheral) insufficiency 10/13/2006  . RHINITIS, ALLERGIC 10/13/2006  . Asthma 10/13/2006  . Reflux esophagitis 10/13/2006  . OSTEOARTHRITIS, MULTI SITES 10/13/2006  . INCONTINENCE, URGE 10/13/2006    Past Surgical History:  Procedure Laterality Date  .  APPENDECTOMY    . CHOLECYSTECTOMY    . COLONOSCOPY     benign polyps (12/2000), repeat colonoscopy performed in 2007  . ENDOMETRIAL BIOPSY     10/03 benign columnar mucosa (limited material)  . KIDNEY SURGERY       OB History   No obstetric history on file.      Home Medications    Prior to Admission medications   Medication Sig Start Date End Date Taking? Authorizing Provider  acetaminophen (TYLENOL) 325 MG tablet Take 325 mg by mouth 2 (two) times daily.     [provider]    albuterol (PROVENTIL HFA;VENTOLIN HFA) 108 (90 Base) MCG/ACT inhaler Inhale 2 puffs into the lungs every 6 (six) hours as needed for wheezing or shortness of breath.    [provider]  albuterol (PROVENTIL) (2.5 MG/3ML) 0.083% nebulizer solution Take 2.5 mg by nebulization every 6 (six) hours as needed for wheezing.     [provider]  amLODipine (NORVASC) 5 MG tablet Take 1 tablet (5 mg total) by mouth daily. 07/31/18 07/31/19  GhimireHenreitta Leber, MD  aspirin 81 MG tablet Take 81 mg by mouth daily.    [provider]  atorvastatin (LIPITOR) 40 MG tablet Take 40 mg by mouth at bedtime.     [provider]  cephALEXin (KEFLEX) 500 MG capsule Take 500 mg by mouth 3 (three) times daily. 10 day course starting on 08/10/2018    [provider]  Cholecalciferol (VITAMIN D3) 50 MCG (2000 UT) TABS Take 4,000 Units by mouth daily.    [provider]  docusate sodium (COLACE) 100 MG capsule Take 100 mg by mouth daily.     [provider]  EPINEPHrine (EPI-PEN) 0.3 mg/0.3 mL DEVI Inject 0.3 mg into the muscle once as needed (for bee stings, then go to the ED immediately after using).     [provider]  glipiZIDE (GLUCOTROL XL) 5 MG 24 hr tablet Take 5 mg by mouth 2 (two) times daily.     [provider]  Glycerin, Laxative, (FLEET LIQUID GLYCERIN SUPP RE) Place 1 suppository rectally daily as needed (for constipation).    [provider]  haloperidol (HALDOL) 0.5 MG tablet Take 1 tablet (0.5 mg total) by mouth 2 (two) times daily. 07/31/18   Ghimire, Henreitta Leber, MD  HYDROcodone-acetaminophen (NORCO/VICODIN) 5-325 MG tablet Take 1-2 tablets by mouth every 6 (six) hours as needed for moderate pain or severe pain. 08/15/18   Hayden Rasmussen, MD  hydrocortisone 2.5 % ointment Apply 1 application topically daily as needed (itchy skin).    [provider]  insulin degludec (TRESIBA FLEXTOUCH) 100 UNIT/ML SOPN  FlexTouch Pen Inject 20 Units into the skin at bedtime.    [provider]  linagliptin (TRADJENTA) 5 MG TABS tablet Take 5 mg by mouth daily.    [provider]  loperamide (IMODIUM) 2 MG capsule Take 2 mg by mouth 3 (three) times daily as needed (for diarrhea).     [provider]  montelukast (SINGULAIR) 10 MG tablet Take 10 mg by mouth at bedtime.    [provider]  Multiple Vitamin (DAILY VITE PO) Take 1 tablet by mouth daily.    [provider]  omeprazole (PRILOSEC) 20 MG capsule Take 20 mg by mouth daily.     [provider]  polyethylene glycol powder (GLYCOLAX/MIRALAX) powder Take 17 g by mouth daily as needed (for constipation).    [provider]  pyridOXINE (VITAMIN B-6) 100 MG tablet Take 200 mg by mouth 2 (two) times daily.    [provider]  spironolactone (ALDACTONE) 25 MG tablet Take 1 tablet (25 mg total) by mouth daily. 04/12/18   Mesner, Corene Cornea, MD  torsemide (DEMADEX) 20 MG tablet Take 60 mg alternating with 40 mg daily. Patient taking differently: Take 40-60 mg by mouth See admin instructions. Alternate taking 40mg  with 60mg  every other day (40mg , 60mg , 40mg , etc) 07/26/18   Strader, Fransisco Hertz, PA-C  vancomycin (VANCOCIN) 250 MG capsule Take 250 mg by mouth 4 (four) times daily. 10 day course starting on 08/11/2018    [provider]    Family History Family History  Problem Relation Age of Onset  . Bone cancer Mother   . Hypertension Mother   . Heart disease Mother   . COPD Father   . Heart disease Father   . Stroke Father     Social History Social History   Tobacco Use  . Smoking status: Former Smoker    Types: Cigarettes    Last attempt to quit: 06/23/1972    Years since quitting: 46.1  . Smokeless tobacco: Never Used  Substance Use Topics  . Alcohol use: No  . Drug use: No     Allergies   Benztropine mesylate; Codeine; Diazepam; Diphenhydramine hcl; Penicillins;  Sulfonamide derivatives; Thioridazine hcl; Benztropine; Penicillin g; Lisinopril; and Sulfamethoxazole   Review of Systems Review of Systems  Unable to perform ROS: Mental status change     Physical Exam Updated Vital Signs BP (!) 148/59   Pulse 90   Temp 99 F (37.2 C)   Resp 17   SpO2 100%   Physical Exam Vitals signs and nursing note reviewed.  Constitutional:      General: She is in acute distress.     Appearance: Normal appearance. She is well-developed.  HENT:     Head: Normocephalic and atraumatic.     Right Ear: Hearing normal.     Left Ear: Hearing normal.     Nose: Nose normal.  Eyes:     Conjunctiva/sclera: Conjunctivae normal.     Pupils: Pupils are equal, round, and reactive to light.  Neck:     Musculoskeletal: Normal range of motion and neck supple.  Cardiovascular:     Rate and Rhythm: Regular rhythm.     Heart sounds: S1 normal and S2 normal. No murmur. No friction rub. No gallop.   Pulmonary:     Effort: Pulmonary effort is normal. No respiratory distress.     Breath sounds: Normal breath sounds.  Chest:     Chest wall: No tenderness.  Abdominal:     General: Bowel sounds are normal.     Palpations: Abdomen is soft.     Tenderness: There is no abdominal tenderness. There is no guarding or rebound. Negative signs include Murphy's sign and McBurney's sign.     Hernia: No hernia is present.  Musculoskeletal: Normal range of motion.     Right lower leg: Edema present.     Left lower leg: Edema present.  Skin:    General: Skin is warm and dry.     Findings: Erythema (Bilateral shins, associated with lower extremity edema) present. No rash.  Neurological:     Mental Status: She is alert. She is disoriented and confused.     GCS: GCS eye subscore is 4. GCS verbal subscore is 4. GCS motor subscore is 6.     Cranial Nerves: No cranial  nerve deficit.     Sensory: No sensory deficit.     Coordination: Coordination normal.  Psychiatric:        Mood and  Affect: Mood is anxious.        Behavior: Behavior is agitated.      ED Treatments / Results  Labs (all labs ordered are listed, but only abnormal results are displayed) Labs Reviewed  CBC WITH DIFFERENTIAL/PLATELET - Abnormal; Notable for the following components:      Result Value   WBC 10.7 (*)    RBC 3.08 (*)    Hemoglobin 9.7 (*)    HCT 29.6 (*)    Neutro Abs 8.1 (*)    All other components within normal limits  COMPREHENSIVE METABOLIC PANEL - Abnormal; Notable for the following components:   Glucose, Bld 324 (*)    BUN 38 (*)    Creatinine, Ser 2.38 (*)    Calcium 8.7 (*)    Albumin 3.2 (*)    GFR calc non Af Amer 21 (*)    GFR calc Af Amer 24 (*)    All other components within normal limits  URINALYSIS, ROUTINE W REFLEX MICROSCOPIC - Abnormal; Notable for the following components:   Glucose, UA >=500 (*)    Protein, ur >=300 (*)    Bacteria, UA RARE (*)    All other components within normal limits  CBG MONITORING, ED - Abnormal; Notable for the following components:   Glucose-Capillary 282 (*)    All other components within normal limits  I-STAT CHEM 8, ED - Abnormal; Notable for the following components:   BUN 34 (*)    Creatinine, Ser 2.40 (*)    Glucose, Bld 325 (*)    Calcium, Ion 1.14 (*)    Hemoglobin 9.2 (*)    HCT 27.0 (*)    All other components within normal limits  I-STAT CG4 LACTIC ACID, ED - Abnormal; Notable for the following components:   Lactic Acid, Venous 2.93 (*)    All other components within normal limits  URINE CULTURE  CULTURE, BLOOD (ROUTINE X 2)  CULTURE, BLOOD (ROUTINE X 2)  RAPID URINE DRUG SCREEN, HOSP PERFORMED  AMMONIA  BLOOD GAS, ARTERIAL  BETA-HYDROXYBUTYRIC ACID  ETHANOL  I-STAT TROPONIN, ED    EKG EKG Interpretation  Date/Time:  Wednesday August 16 2018 01:12:39 EST Ventricular Rate:  91 PR Interval:    QRS Duration: 166 QT Interval:  400 QTC Calculation: 493 R Axis:   -53 Text Interpretation:  Ectopic atrial  rhythm Borderline short PR interval RBBB and LAFB Left ventricular hypertrophy Baseline wander in lead(s) V1 No significant change since last tracing Confirmed by Orpah Greek [phone]) on 08/16/2018 2:51:17 AM   Radiology Ct Head Wo Contrast  Result Date: 08/16/2018 CLINICAL DATA:  Acute onset of altered mental status. Patient unresponsive. EXAM: CT HEAD WITHOUT CONTRAST TECHNIQUE: Contiguous axial images were obtained from the base of the skull through the vertex without intravenous contrast. COMPARISON:  CT of the head performed 08/11/2018 FINDINGS: Brain: No evidence of acute infarction, hemorrhage, hydrocephalus, extra-axial collection or mass lesion/mass effect. Mild periventricular white matter change likely reflects small vessel ischemic microangiopathy. A chronic lacunar infarct is noted at the left basal ganglia. The posterior fossa, including the cerebellum, brainstem and fourth ventricle, is within normal limits. The third and lateral ventricles are unremarkable in appearance. The cerebral hemispheres are symmetric in appearance, with normal gray-white differentiation. No mass effect or midline shift is seen. Vascular: No hyperdense vessel  or unexpected calcification. Skull: There is no evidence of fracture; visualized osseous structures are unremarkable in appearance. Sinuses/Orbits: The orbits are within normal limits. Mucosal thickening is noted at the maxillary sinuses bilaterally, with a likely mucus retention cyst or polyp at the left maxillary sinus. The remaining paranasal sinuses and mastoid air cells are well-aerated. Other: No significant soft tissue abnormalities are seen. IMPRESSION: 1. No acute intracranial pathology seen on CT. 2. Mild small vessel ischemic microangiopathy. 3. 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 4. Mucosal thickening at the maxillary sinuses bilaterally, with a likely mucus retention cyst or polyp at the left maxillary sinus. Electronically Signed    By: Garald Balding M.D.   On: 08/16/2018 02:23   US Venous Img Lower Bilateral  Result Date: 08/15/2018 CLINICAL DATA:  Bilateral lower extremity pain and edema since this morning. Evaluate for DVT. EXAM: BILATERAL LOWER EXTREMITY VENOUS DOPPLER ULTRASOUND TECHNIQUE: Gray-scale sonography with graded compression, as well as color Doppler and duplex ultrasound were performed to evaluate the lower extremity deep venous systems from the level of the common femoral vein and including the common femoral, femoral, profunda femoral, popliteal and calf veins including the posterior tibial, peroneal and gastrocnemius veins when visible. The superficial great saphenous vein was also interrogated. Spectral Doppler was utilized to evaluate flow at rest and with distal augmentation maneuvers in the common femoral, femoral and popliteal veins. COMPARISON:  None. FINDINGS: RIGHT LOWER EXTREMITY Common Femoral Vein: No evidence of thrombus. Normal compressibility, respiratory phasicity and response to augmentation. Saphenofemoral Junction: No evidence of thrombus. Normal compressibility and flow on color Doppler imaging. Profunda Femoral Vein: No evidence of thrombus. Normal compressibility and flow on color Doppler imaging. Femoral Vein: No evidence of thrombus. Normal compressibility, respiratory phasicity and response to augmentation. Popliteal Vein: No evidence of thrombus. Normal compressibility, respiratory phasicity and response to augmentation. Calf Veins: No evidence of thrombus. Normal compressibility and flow on color Doppler imaging. Superficial Great Saphenous Vein: No evidence of thrombus. Normal compressibility. Venous Reflux:  None. Other Findings:  None. LEFT LOWER EXTREMITY Common Femoral Vein: No evidence of thrombus. Normal compressibility, respiratory phasicity and response to augmentation. Saphenofemoral Junction: No evidence of thrombus. Normal compressibility and flow on color Doppler imaging.  Profunda Femoral Vein: No evidence of thrombus. Normal compressibility and flow on color Doppler imaging. Femoral Vein: No evidence of thrombus. Normal compressibility, respiratory phasicity and response to augmentation. Popliteal Vein: No evidence of thrombus. Normal compressibility, respiratory phasicity and response to augmentation. Calf Veins: No evidence of thrombus. Normal compressibility and flow on color Doppler imaging. Superficial Great Saphenous Vein: No evidence of thrombus. Normal compressibility. Venous Reflux:  None. Other Findings:  None. IMPRESSION: No evidence of DVT within either lower extremity. Electronically Signed   By: Sandi Mariscal M.D.   On: 08/15/2018 17:09   Dg Chest Port 1 View  Result Date: 08/16/2018 CLINICAL DATA:  Acute onset of altered mental status. Endotracheal tube placement. EXAM: PORTABLE CHEST 1 VIEW COMPARISON:  Chest radiograph performed 08/15/2018 FINDINGS: The patient's endotracheal tube is seen ending 1.5 cm above the carina. This could be retracted 2 cm. The patient's enteric tube is seen ending overlying the body of the stomach, with the side port about the gastroesophageal junction. This could be advanced 3 cm. The lungs are mildly hypoexpanded. Vascular congestion and vascular crowding are noted. No pleural effusion or pneumothorax is seen The cardiomediastinal silhouette is mildly enlarged. No acute osseous abnormalities are identified. IMPRESSION: 1. Endotracheal tube seen ending  1.5 cm above the carina. This could be retracted 2 cm. 2. Enteric tube noted ending overlying the body of the stomach, with the side port about the gastroesophageal junction. This could be advanced 3 cm. 3. Lungs mildly hypoexpanded but grossly clear. Vascular congestion and mild cardiomegaly noted. Electronically Signed   By: Garald Balding M.D.   On: 08/16/2018 02:03   Dg Chest Port 1 View  Result Date: 08/15/2018 CLINICAL DATA:  68 year old female with chest pain and weakness.  EXAM: PORTABLE CHEST 1 VIEW COMPARISON:  [facility] 08/11/2018 and earlier. FINDINGS: Portable AP upright view at 1453 hours. Low but improved lung volumes. Normal cardiac size and mediastinal contours. Visualized tracheal air column is within normal limits. Allowing for portable technique the lungs are clear. No pneumothorax. Paucity of bowel gas in the upper abdomen. Visible osseous structures appear intact. IMPRESSION: No acute cardiopulmonary abnormality. Electronically Signed   By: Genevie Ann M.D.   On: 08/15/2018 15:07    Procedures Procedure Name: Intubation Date/Time: 08/16/2018 3:05 AM Performed by: Orpah Greek, MD Pre-anesthesia Checklist: Patient identified, Patient being monitored, Emergency Drugs available, Timeout performed and Suction available Oxygen Delivery Method: Non-rebreather mask Preoxygenation: Pre-oxygenation with 100% oxygen Induction Type: Rapid sequence Ventilation: Mask ventilation without difficulty Laryngoscope Size: Glidescope and 4 Grade View: Grade II Tube size: 7.5 mm Number of attempts: 2 Airway Equipment and Method: Patient positioned with wedge pillow and Rigid stylet Placement Confirmation: ETT inserted through vocal cords under direct vision,  CO2 detector and Breath sounds checked- equal and bilateral Secured at: 21 cm Tube secured with: ETT holder Dental Injury: Bloody posterior oropharynx  Difficulty Due To: Difficulty was unanticipated and Difficult Airway- due to anterior larynx    .Critical Care Performed by: Orpah Greek, MD Authorized by: Orpah Greek, MD   Critical care provider statement:    Critical care time (minutes):  40   Critical care time was exclusive of:  Separately billable procedures and treating other patients   Critical care was necessary to treat or prevent imminent or life-threatening deterioration of the following conditions:  Respiratory failure and CNS failure or compromise    Critical care was time spent personally by me on the following activities:  Ordering and performing treatments and interventions, ordering and review of laboratory studies, ordering and review of radiographic studies, pulse oximetry, re-evaluation of patient's condition, review of old charts, examination of patient, evaluation of patient's response to treatment and discussions with consultants   (including critical care time)  Medications Ordered in ED Medications  propofol (DIPRIVAN) 1000 MG/100ML infusion (45 mcg/kg/min  97.5 kg Intravenous Rate/Dose Change 08/16/18 0253)  etomidate (AMIDATE) injection (20 mg Intravenous Given 08/16/18 0129)  succinylcholine (ANECTINE) injection (100 mg Intravenous Given 08/16/18 0129)     Initial Impression / Assessment and Plan / ED Course  I have reviewed the triage vital signs and the nursing notes.  Pertinent labs & imaging results that were available during my care of the patient were reviewed by me and considered in my medical decision making (see chart for details).     Presents to the emergency department for evaluation of mental status changes.  At arrival to the emergency department patient seems somewhat agitated and anxious.  She was crying and stating that she was seeing monsters.  While I was examining her, however, she became unresponsive.  Patient was noted to be staring off to the right and respirations became slow and very shallow.  Oxygen saturations dropped  down to 70%.  Eyes were open and she was staring to the right.  I could not get her to change her gaze.  She did not flinch from confrontation.  She was unarousable to sternal rub and nailbed pressure.  She lost her gag reflex.  She then developed rhythmic beating of her eyelids, lips and tongue.  Blood pressure markedly elevated during this episode.  This was concerning for seizure.  It was therefore determined that she required intubation.  This was performed with slight difficulty secondary  to anterior airway and body habitus.  There was some mild trauma trying to pass the 7.5 endotracheal tube through her vocal cords.  She has been oxygenating well since intubation.  Patient placed on propofol drip for sedation as well as to prevent possible seizures.  ET head is unremarkable.  Chest x-ray does not show any acute abnormality.  She has a slight lactic acidosis, otherwise no significant abnormalities found on work-up.  Final Clinical Impressions(s) / ED Diagnoses   Final diagnoses:  Acute encephalopathy  Acute respiratory failure with hypoxia [facility])    ED Discharge Orders    None       Orpah Greek, MD 08/16/18 1950    Orpah Greek, MD 08/16/18 9326    Orpah Greek, MD 08/16/18 0403

## 2018-08-16 NOTE — Procedures (Signed)
History: 68 year old female being evaluated for episode of unresponsiveness  Sedation: None  Technique: This is a 21 channel routine scalp EEG performed at the bedside with bipolar and monopolar montages arranged in accordance to the international 10/20 system of electrode placement. One channel was dedicated to EKG recording.    Background: The background consists of intermixed alpha and beta activities. There is a well defined posterior dominant rhythm of 8-9 Hz that attenuates with eye opening. Sleep is recorded with normal appearing structures.   Photic stimulation: Physiologic driving is not performed  EEG Abnormalities: None  Clinical Interpretation: This normal EEG is recorded in the waking and sleep state. There was no seizure or seizure predisposition recorded on this study. Please note that lack of epileptiform activity on EEG does not preclude the possibility of epilepsy.   Roland Rack, MD [facility] [phone]  If 7pm- 7am, please page neurology on call as listed in [place].

## 2018-08-17 ENCOUNTER — Inpatient Hospital Stay (HOSPITAL_COMMUNITY): Payer: Medicare Other

## 2018-08-17 LAB — URINE CULTURE
Culture: NO GROWTH
Culture: NO GROWTH

## 2018-08-17 LAB — BASIC METABOLIC PANEL
Anion gap: 7 (ref 5–15)
BUN: 23 mg/dL (ref 8–23)
CO2: 23 mmol/L (ref 22–32)
Calcium: 8.5 mg/dL — ABNORMAL LOW (ref 8.9–10.3)
Chloride: 114 mmol/L — ABNORMAL HIGH (ref 98–111)
Creatinine, Ser: 1.97 mg/dL — ABNORMAL HIGH (ref 0.44–1.00)
GFR calc Af Amer: 30 mL/min — ABNORMAL LOW (ref >60)
GFR calc non Af Amer: 26 mL/min — ABNORMAL LOW (ref >60)
Glucose, Bld: 127 mg/dL — ABNORMAL HIGH (ref 70–99)
Potassium: 3.8 mmol/L (ref 3.5–5.1)
Sodium: 144 mmol/L (ref 135–145)

## 2018-08-17 LAB — CBC WITH DIFFERENTIAL/PLATELET
Abs Immature Granulocytes: 0.03 10*3/uL (ref 0.00–0.07)
Basophils Absolute: 0 10*3/uL (ref 0.0–0.1)
Basophils Relative: 0 %
Eosinophils Absolute: 0.3 10*3/uL (ref 0.0–0.5)
Eosinophils Relative: 4 %
HCT: 24.5 % — ABNORMAL LOW (ref 36.0–46.0)
Hemoglobin: 8.1 g/dL — ABNORMAL LOW (ref 12.0–15.0)
Immature Granulocytes: 0 %
Lymphocytes Relative: 18 %
Lymphs Abs: 1.3 10*3/uL (ref 0.7–4.0)
MCH: 31.8 pg (ref 26.0–34.0)
MCHC: 33.1 g/dL (ref 30.0–36.0)
MCV: 96.1 fL (ref 80.0–100.0)
Monocytes Absolute: 0.5 10*3/uL (ref 0.1–1.0)
Monocytes Relative: 8 %
Neutro Abs: 5.1 10*3/uL (ref 1.7–7.7)
Neutrophils Relative %: 70 %
Platelets: 186 10*3/uL (ref 150–400)
RBC: 2.55 MIL/uL — ABNORMAL LOW (ref 3.87–5.11)
RDW: 14 % (ref 11.5–15.5)
WBC: 7.2 10*3/uL (ref 4.0–10.5)
nRBC: 0 % (ref 0.0–0.2)

## 2018-08-17 LAB — GLUCOSE, CAPILLARY
Glucose-Capillary: 111 mg/dL — ABNORMAL HIGH (ref 70–99)
Glucose-Capillary: 146 mg/dL — ABNORMAL HIGH (ref 70–99)
Glucose-Capillary: 231 mg/dL — ABNORMAL HIGH (ref 70–99)
Glucose-Capillary: 248 mg/dL — ABNORMAL HIGH (ref 70–99)
Glucose-Capillary: 282 mg/dL — ABNORMAL HIGH (ref 70–99)

## 2018-08-17 MED ORDER — LEVETIRACETAM 500 MG PO TABS
500.0000 mg | ORAL_TABLET | Freq: Two times a day (BID) | ORAL | Status: DC
  Administered 2018-08-17 – 2018-08-20 (×6): 500 mg via ORAL
  Filled 2018-08-17 (×7): qty 1

## 2018-08-17 MED ORDER — SPIRONOLACTONE 25 MG PO TABS
25.0000 mg | ORAL_TABLET | Freq: Every day | ORAL | Status: DC
  Administered 2018-08-17 – 2018-08-20 (×4): 25 mg via ORAL
  Filled 2018-08-17 (×4): qty 1

## 2018-08-17 MED ORDER — TORSEMIDE 20 MG PO TABS
40.0000 mg | ORAL_TABLET | ORAL | Status: DC
  Administered 2018-08-18 – 2018-08-20 (×2): 40 mg via ORAL
  Filled 2018-08-17 (×3): qty 2

## 2018-08-17 MED ORDER — TORSEMIDE 20 MG PO TABS
60.0000 mg | ORAL_TABLET | ORAL | Status: DC
  Administered 2018-08-17 – 2018-08-19 (×2): 60 mg via ORAL
  Filled 2018-08-17 (×4): qty 3

## 2018-08-17 MED ORDER — HYDROCODONE-ACETAMINOPHEN 7.5-325 MG/15ML PO SOLN
10.0000 mL | Freq: Four times a day (QID) | ORAL | Status: DC | PRN
  Filled 2018-08-17: qty 15

## 2018-08-17 MED ORDER — INSULIN ASPART 100 UNIT/ML ~~LOC~~ SOLN
0.0000 [IU] | Freq: Every day | SUBCUTANEOUS | Status: DC
  Administered 2018-08-17: 3 [IU] via SUBCUTANEOUS
  Administered 2018-08-19: 2 [IU] via SUBCUTANEOUS

## 2018-08-17 MED ORDER — HYDROCODONE-ACETAMINOPHEN 5-325 MG PO TABS
1.0000 | ORAL_TABLET | Freq: Four times a day (QID) | ORAL | Status: DC | PRN
  Administered 2018-08-17 – 2018-08-20 (×5): 1 via ORAL
  Filled 2018-08-17 (×5): qty 1

## 2018-08-17 MED ORDER — ACETAMINOPHEN 325 MG PO TABS
650.0000 mg | ORAL_TABLET | Freq: Four times a day (QID) | ORAL | Status: DC | PRN
  Administered 2018-08-17 (×3): 650 mg via ORAL
  Filled 2018-08-17 (×3): qty 2

## 2018-08-17 MED ORDER — ORAL CARE MOUTH RINSE
15.0000 mL | Freq: Two times a day (BID) | OROMUCOSAL | Status: DC
  Administered 2018-08-17 – 2018-08-19 (×6): 15 mL via OROMUCOSAL

## 2018-08-17 MED ORDER — INSULIN ASPART 100 UNIT/ML ~~LOC~~ SOLN
0.0000 [IU] | Freq: Three times a day (TID) | SUBCUTANEOUS | Status: DC
  Administered 2018-08-17 (×2): 5 [IU] via SUBCUTANEOUS
  Administered 2018-08-18: 3 [IU] via SUBCUTANEOUS
  Administered 2018-08-18: 11 [IU] via SUBCUTANEOUS
  Administered 2018-08-19: 8 [IU] via SUBCUTANEOUS
  Administered 2018-08-19 – 2018-08-20 (×2): 5 [IU] via SUBCUTANEOUS

## 2018-08-17 NOTE — Evaluation (Signed)
Physical Therapy Evaluation Patient Details Name: [name] MRN: [id number] DOB: [birth year] Today's Date: 08/17/2018   History of Present Illness  42 yoF presenting with acute encephalopathy and weakness.  Intubated 12/12-12/13. Initial stroke workup negative.  Unclear etiology for encephalopathy at this time. PMH includes but not limited to: COPD, CKD, CHF, HLD, GERD, GOUT, HTN, PTSD, schizophrenia, DM2, memory loss, hypothyroid.    Clinical Impression  Pt admitted with above diagnosis. Pt currently with functional limitations due to the deficits listed below (see PT Problem List). PTA pt living at ALF, assistance with ADLs, RW for short distance ambulation but mostly utilzies w/c and reports independence with transfers. Unsure true level of baseline cognition but patient states she feels fine today, slow processing and at times slightly inappropriate such as repeating herself and not recalling she had told me already. AOx4 and following all commands. Patient min A to stand and taking steps along bed with hand held assistance, premature sitting back on bed. Feel with progression she could return back to ALF and receive PT there.  Pt will benefit from skilled PT to increase their independence and safety with mobility to allow discharge to the venue listed below.        Follow Up Recommendations Home health PT;Supervision for mobility/OOB    Equipment Recommendations  None recommended by PT    Recommendations for Other Services OT consult     Precautions / Restrictions Precautions Precautions: Fall Restrictions Weight Bearing Restrictions: No      Mobility  Bed Mobility Overal bed mobility: Needs Assistance Bed Mobility: Supine to Sit     Supine to sit: Min guard        Transfers Overall transfer level: Needs assistance Equipment used: 1 person hand held assist Transfers: Sit to/from Stand Sit to Stand: Min guard Stand pivot transfers: Min guard       General  transfer comment: min guard, patient side stepping along the bed back n forth several times, premature sit x2.   Ambulation/Gait                Stairs            Wheelchair Mobility    Modified Rankin (Stroke Patients Only)       Balance Overall balance assessment: Needs assistance Sitting-balance support: Feet supported Sitting balance-Leahy Scale: Fair     Standing balance support: Single extremity supported Standing balance-Leahy Scale: Poor                               Pertinent Vitals/Pain Pain Score: 0-No pain    Home Living Family/patient expects to be discharged to:: Assisted living               Home Equipment: Wheelchair - manual Additional Comments: Pt reports living in an ALF, uses wheelchair for all functional mobility    Prior Function Level of Independence: Needs assistance   Gait / Transfers Assistance Needed: propels self with w/c, completes own transfers  ADL's / Homemaking Assistance Needed: Pt reports assistance with showers, dressing, and toileting        Hand Dominance   Dominant Hand: Right    Extremity/Trunk Assessment   Upper Extremity Assessment Upper Extremity Assessment: Defer to OT evaluation    Lower Extremity Assessment Lower Extremity Assessment: Generalized weakness       Communication   Communication: No difficulties  Cognition Arousal/Alertness: Awake/alert Behavior During Therapy: WFL for tasks assessed/performed;Flat  affect Overall Cognitive Status: No family/caregiver present to determine baseline cognitive functioning                                 General Comments: pt tells me tonight that "nothing feels wrong"       General Comments      Exercises     Assessment/Plan    PT Assessment Patient needs continued PT services  PT Problem List Decreased strength       PT Treatment Interventions DME instruction;Gait training;Stair training;Therapeutic  activities;Functional mobility training;Therapeutic exercise    PT Goals (Current goals can be found in the Care Plan section)  Acute Rehab PT Goals Patient Stated Goal: to go home to ALF PT Goal Formulation: With patient Time For Goal Achievement: 08/31/18 Potential to Achieve Goals: Fair    Frequency Min 2X/week   Barriers to discharge        Co-evaluation               AM-PAC PT "6 Clicks" Mobility  Outcome Measure Help needed turning from your back to your side while in a flat bed without using bedrails?: A Little Help needed moving from lying on your back to sitting on the side of a flat bed without using bedrails?: A Little Help needed moving to and from a bed to a chair (including a wheelchair)?: A Little Help needed standing up from a chair using your arms (e.g., wheelchair or bedside chair)?: A Little Help needed to walk in hospital room?: A Lot Help needed climbing 3-5 steps with a railing? : Total 6 Click Score: 15    End of Session Equipment Utilized During Treatment: Gait belt;Oxygen Activity Tolerance: Patient limited by fatigue Patient left: in chair;with call bell/phone within reach;with chair alarm set Nurse Communication: Mobility status PT Visit Diagnosis: Unsteadiness on feet (R26.81)    Time: 3014-9969 PT Time Calculation (min) (ACUTE ONLY): 23 min   Charges:   PT Evaluation $PT Eval Low Complexity: 1 Low PT Treatments $Therapeutic Activity: 8-22 mins        Reinaldo Berber, PT, DPT Acute Rehabilitation Services Pager: [phone] Office: [phone]    Reinaldo Berber 08/17/2018, 6:19 PM

## 2018-08-17 NOTE — Progress Notes (Signed)
Modified Barium Swallow Progress Note  Patient Details  Name: [name] MRN: [id number] Date of Birth: Aug 04, 1951  Today's Date: 08/17/2018  Modified Barium Swallow completed.  Full report located under Chart Review in the Imaging Section.  Brief recommendations include the following:  Clinical Impression  Pt has a mild oropharyngeal dysphagia due to impaired timing of airway closure, resulting in aspiration during the swallow with thin liquids. Pt also has mild lingual residue after the swallow with all consistencies, although with oral residuals spilling posteriorly into the pharynx with thin liquids. This did not result in any further aspiration during the study, but positioning in bed and impulsivity could allow for aspiration after the swallow during meals, particularly as pt does not spontaneously manage her residuals. When pt does aspirate during the swallow it is consistently sensed, although sometimes with a brief delay (several seconds), and her cough does not eject aspirates from her airway. A chin tuck with thin liquids helps to reduce airway compromise to flash penetration. Flash penetration occurs with nectar thick liquids as well without use of chin tuck, even as pt takes very large sips. Recommend Dys 3 diet and thin liquids with chin tuck. Will f/u for tolerance and ability to implement strategies consistently. If pt continues to cough at bedside, downgrade to nectar thick liquids could be considered.   Swallow Evaluation Recommendations       SLP Diet Recommendations: Dysphagia 3 (Mech soft) solids;Thin liquid   Liquid Administration via: Cup;Straw   Medication Administration: Whole meds with puree   Supervision: Patient able to self feed;Full supervision/cueing for compensatory strategies   Compensations: Minimize environmental distractions;Slow rate;Small sips/bites;Chin tuck   Postural Changes: Seated upright at 90 degrees;Remain semi-upright after after  feeds/meals (Comment)   Oral Care Recommendations: Oral care BID        [name] 08/17/2018,11:22 AM   [name], M.A. [place] Acute Environmental education officer [phone] Office [phone]

## 2018-08-17 NOTE — Evaluation (Signed)
Occupational Therapy Evaluation Patient Details Name: [name] MRN: [id number] DOB: 1951-08-11 Today's Date: 08/17/2018    History of Present Illness 87 yoF presenting with acute encephalopathy and weakness.  Intubated 12/12-12/13. Initial stroke workup negative.  Unclear etiology for encephalopathy at this time. PMH includes but not limited to: COPD, CKD, CHF, HLD, GERD, GOUT, HTN, PTSD, schizophrenia, DM2, memory loss, hypothyroid.   Clinical Impression   Pt admitted with above diagnoses, with a decreased activity tolerance and generalized weakness limiting ability to complete independent BADL. Pt is oriented and able to report her PLOF, but reports her thinking "feels slower". Pt at ALF PTA. Noted slower processing and some tongue movements during processing multistep tasks. Pt completed simulated w/c transfer at min guard A and grooming tasks in seated position at set up A level. Pt encouraged to cough and with IS usage, c/o of pain. Educated pt to splint cough with pillow, compliant but cough not productive during eval. Pt will benefit from continued acute OT, recommend HHOT at d/c to continue to maximize functional independence and safety in BADL in the home environment.    Follow Up Recommendations  Home health OT;Supervision/Assistance - 24 hour    Equipment Recommendations  None recommended by OT    Recommendations for Other Services       Precautions / Restrictions Precautions Precautions: Fall Restrictions Weight Bearing Restrictions: No      Mobility Bed Mobility Overal bed mobility: Needs Assistance Bed Mobility: Supine to Sit     Supine to sit: Min guard        Transfers Overall transfer level: Needs assistance Equipment used: 1 person hand held assist Transfers: Sit to/from Stand;Stand Pivot Transfers Sit to Stand: Min guard Stand pivot transfers: Min guard       General transfer comment: close min guard for balance    Balance Overall balance  assessment: Needs assistance Sitting-balance support: Feet supported Sitting balance-Leahy Scale: Fair     Standing balance support: Single extremity supported Standing balance-Leahy Scale: Poor                             ADL either performed or assessed with clinical judgement   ADL Overall ADL's : Needs assistance/impaired Eating/Feeding: Set up Eating/Feeding Details (indicate cue type and reason): at this time NPO for pending swallow study Grooming: Wash/dry face;Oral care;Brushing hair;Set up;Sitting   Upper Body Bathing: Minimal assistance;Sitting   Lower Body Bathing: Maximal assistance;Sit to/from stand   Upper Body Dressing : Minimal assistance;Sitting   Lower Body Dressing: Total assistance;Sit to/from stand   Toilet Transfer: Minimal assistance;Stand-pivot;BSC   Toileting- Clothing Manipulation and Hygiene: Maximal assistance;Sit to/from stand   Tub/ Banker: Moderate assistance;Shower seat   Functional mobility during ADLs: Min guard General ADL Comments: removed O2, VSS throughout session. Pt eager to get out of bed and participate. Simulated w/c transfer with recliner, pt at min guard to pivot. Set up A for oral grooming tasks     Vision Baseline Vision/History: Wears glasses Wears Glasses: At all times Patient Visual Report: Other (comment)(pt reports difficulty seeing fine print) Additional Comments: pt reports her "diabetes has affected her vision" able to read whiteboard, reports difficulty with fine print     Perception     Praxis      Pertinent Vitals/Pain Pain Assessment: 0-10 Pain Score: 10-Worst pain ever Pain Location: feet Pain Descriptors / Indicators: Discomfort Pain Intervention(s): Limited activity within patient's tolerance;Monitored during session;Repositioned;Patient requesting  pain meds-RN notified     Hand Dominance Right   Extremity/Trunk Assessment Upper Extremity Assessment Upper Extremity Assessment:  Generalized weakness   Lower Extremity Assessment Lower Extremity Assessment: Defer to PT evaluation       Communication Communication Communication: No difficulties   Cognition Arousal/Alertness: Awake/alert Behavior During Therapy: WFL for tasks assessed/performed;Flat affect Overall Cognitive Status: No family/caregiver present to determine baseline cognitive functioning                                 General Comments: Pt is fully oriented, but reports that she feels her thinking is "a little slower". Pt was slower to process, she reported thinking it is from a new medication. She was able to converse well.   General Comments       Exercises     Shoulder Instructions      Home Living Family/patient expects to be discharged to:: Assisted living                             Home Equipment: Wheelchair - manual   Additional Comments: Pt reports living in an ALF, uses wheelchair for all functional mobility      Prior Functioning/Environment Level of Independence: Needs assistance  Gait / Transfers Assistance Needed: propels self with w/c, completes own transfers ADL's / [place] Needed: Pt reports assistance with showers, dressing, and toileting            OT Problem List: Decreased strength;Decreased activity tolerance;Impaired balance (sitting and/or standing);Decreased cognition;Obesity;Cardiopulmonary status limiting activity      OT Treatment/Interventions: Self-care/ADL training;Therapeutic exercise;Energy conservation;DME and/or AE instruction;Therapeutic activities;Patient/family education;Balance training    OT Goals(Current goals can be found in the care plan section) Acute Rehab OT Goals Patient Stated Goal: to go home to ALF OT Goal Formulation: With patient Time For Goal Achievement: 08/31/18 Potential to Achieve Goals: Good  OT Frequency: Min 2X/week   Barriers to D/C:            Co-evaluation               AM-PAC OT "6 Clicks" Daily Activity     Outcome Measure Help from another person eating meals?: A Little Help from another person taking care of personal grooming?: A Little Help from another person toileting, which includes using toliet, bedpan, or urinal?: A Lot Help from another person bathing (including washing, rinsing, drying)?: A Lot Help from another person to put on and taking off regular upper body clothing?: A Little Help from another person to put on and taking off regular lower body clothing?: Total 6 Click Score: 14   End of Session Equipment Utilized During Treatment: Gait belt Nurse Communication: Mobility status;Other (comment)(back to bed for swallow study)  Activity Tolerance: Patient tolerated treatment well Patient left: in bed;with call bell/phone within reach;with bed alarm set  OT Visit Diagnosis: Other abnormalities of gait and mobility (R26.89);Muscle weakness (generalized) (M62.81)                Time: 7124-5809 OT Time Calculation (min): 31 min Charges:  OT General Charges $OT Visit: 1 Visit OT Evaluation $OT Eval Moderate Complexity: 1 Mod OT Treatments $Self Care/Home Management : 8-22 mins  Zenovia Jarred, MSOT, OTR/L Behavioral Health OT/ Acute Relief OT [facility] Office: [phone]   Zenovia Jarred 08/17/2018, 10:22 AM

## 2018-08-17 NOTE — Progress Notes (Signed)
Report given to Quillian Quince, RN 2W. Pt to be transported by bed on tele.

## 2018-08-17 NOTE — Evaluation (Signed)
Clinical/Bedside Swallow Evaluation Patient Details  Name: [name] MRN: [id number] Date of Birth: [birth year]  Today's Date: 08/17/2018 Time: SLP Start Time (ACUTE ONLY): 7253 SLP Stop Time (ACUTE ONLY): 0853 SLP Time Calculation (min) (ACUTE ONLY): 11 min  Past Medical History:  Past Medical History:  Diagnosis Date  . Anxiety   . Asthma   . Back pain   . Barrett esophagus   . CHF (congestive heart failure) ([place])   . CKD (chronic kidney disease) stage 3, GFR 30-59 ml/min (HCC)   . COPD (chronic obstructive pulmonary disease) ([place])   . Depression   . Diastolic heart failure ([place])   . Essential hypertension   . Fibromyalgia   . GERD (gastroesophageal reflux disease)   . Gout   . History of pericarditis   . Hyperlipidemia   . Hypothyroid   . Major depressive disorder   . Memory loss   . Nephrolithiasis    2008  . Obesity hypoventilation syndrome (HCC)    CPAP  . Obstructive sleep apnea   . PE (pulmonary embolism)    2010  . Peripheral neuropathy   . PTSD (post-traumatic stress disorder)   . Rheumatoid arthritis ([place])   . Schizophrenia ([place])   . Steroid dependence   . Type 2 diabetes mellitus ([place])   . Vitamin D deficiency    Past Surgical History:  Past Surgical History:  Procedure Laterality Date  . APPENDECTOMY    . CHOLECYSTECTOMY    . COLONOSCOPY     benign polyps (12/2000), repeat colonoscopy performed in 2007  . ENDOMETRIAL BIOPSY     10/03 benign columnar mucosa (limited material)  . KIDNEY SURGERY     HPI:  Pt is a 68 year old lady admitted with AMS and suspected seizure, intubated 1/1 for <24 hours. Pt was recently admitted (December 2019) with similar presentation, with SLP clinical evaluation initially recommending NPO but advanced to Dys 3, thin liquids prior to d/c. Pt says she gets "choked" on liquids "all the time" and believes she has had a swallow study before but there is no documentation of this in her chart. PMH includes: schizophrenia, DM,  RA, PTSD, PE, OSA, memory loss, GERD, COPD, CKD, CHF, barrett's esophagus, asthma, anxiety   Assessment / Plan / Recommendation Clinical Impression  Pt has frequent coughing associated with thin liquid intake via straw and cup sips. She endorses this happening frequently at home PTA, and although she believes that she has had a swallow study before, I cannot see any documentation of this (could have been done elsewhere) and she cannot give specific information about results or even her symptoms. Recommend proceeding with MBS to better assess oropharyngeal function with suspected more chronic dysphagia. Would allow meds whole in puree pending completion, scheduled for later this morning with radiology. SLP Visit Diagnosis: Dysphagia, unspecified (R13.10)    Aspiration Risk  Moderate aspiration risk    Diet Recommendation NPO except meds   Medication Administration: Whole meds with puree    Other  Recommendations Oral Care Recommendations: Oral care QID   Follow up Recommendations        Frequency and Duration            Prognosis Prognosis for Safe Diet Advancement: Good Barriers to Reach Goals: Time post onset      Swallow Study   General HPI: Pt is a 68 year old lady admitted with AMS and suspected seizure, intubated 1/1 for <24 hours. Pt was recently admitted (December 2019) with similar presentation, with SLP clinical  evaluation initially recommending NPO but advanced to Dys 3, thin liquids prior to d/c. Pt says she gets "choked" on liquids "all the time" and believes she has had a swallow study before but there is no documentation of this in her chart. PMH includes: schizophrenia, DM, RA, PTSD, PE, OSA, memory loss, GERD, COPD, CKD, CHF, barrett's esophagus, asthma, anxiety Type of Study: Bedside Swallow Evaluation Previous Swallow Assessment: see HPI Diet Prior to this Study: Thin liquids;Other (Comment)(full liquid diet) Temperature Spikes Noted: Yes(100) Respiratory Status:  Nasal cannula History of Recent Intubation: Yes Length of Intubations (days): 1 days Date extubated: 08/16/18 Behavior/Cognition: Alert;Cooperative Oral Care Completed by SLP: No Oral Cavity - Dentition: Poor condition;Missing dentition Vision: Functional for self-feeding Self-Feeding Abilities: Able to feed self Patient Positioning: Upright in bed Baseline Vocal Quality: Hoarse Volitional Cough: Weak;Congested    Oral/Motor/Sensory Function Overall Oral Motor/Sensory Function: (appears functional as she eats breakfast)   Ice Chips Ice chips: Not tested   Thin Liquid Thin Liquid: Impaired Presentation: Cup;Self Fed;Straw Pharyngeal  Phase Impairments: Cough - Immediate;Cough - Delayed    Nectar Thick Nectar Thick Liquid: Not tested   Honey Thick Honey Thick Liquid: Not tested   Puree Puree: Within functional limits Presentation: Self Fed;Spoon   Solid     Solid: Not tested      [name] 08/17/2018,9:11 AM  [name], M.A. [place] Acute Environmental education officer [phone] Office [phone]

## 2018-08-17 NOTE — Progress Notes (Signed)
NAMEAlmadelia Cross, MRN:  [id number], DOB:  1950/11/23, LOS: 1 ADMISSION DATE:  08/16/2018, CONSULTATION DATE:  1/1 REFERRING MD:  [facility], CHIEF COMPLAINT:  Confusion   Brief History   68 y/o female with schizophrenia intubated for sudden onset of loss of counciousness in ER.   Past Medical History  -Hypothyroidism -Schizophrenia -Depression -Chronic diastolic congestive heart failure -Chronic kidney disease stage IV -Diabetes mellitus type 2  Significant Hospital Events   1/1 extubated  Consults:  Neurology  Procedures:  1/1 ETT > 1/1  Significant Diagnostic Tests:  CT head on 08/16/2018  1. No acute intracranial pathology seen on CT. 2. Mild small vessel ischemic microangiopathy. 3. 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 4. Mucosal thickening at the maxillary sinuses bilaterally, with a likely mucus retention cyst or polyp at the left maxillary sinus.  1/1 EEG > no seizures  Micro Data:  1/1 urine >  1/1 blood >   Antimicrobials:     Interim history/subjective:  Extubated More awake and alert  Objective   Blood pressure (!) 163/77, pulse 80, temperature 98.6 F (37 C), resp. rate 20, height 5\' 3"  (1.6 m), weight 101.9 kg, SpO2 96 %.        Intake/Output Summary (Last 24 hours) at 08/17/2018 0901 Last data filed at 08/17/2018 0800 Gross per 24 hour  Intake 1656.68 ml  Output 2225 ml  Net -568.32 ml   Filed Weights   08/16/18 0600 08/16/18 2000 08/17/18 0415  Weight: 100.9 kg 100.9 kg 101.9 kg    Examination:  General:  Resting comfortably in bed HENT: NCAT OP clear PULM: CTA B, normal effort CV: RRR, no mgr GI: BS+, soft, nontender MSK: normal bulk and tone Neuro: awake, alert, no distress, MAEW   Resolved Hospital Problem list     Assessment & Plan:  Acute loss of conciousness: suspicious for seizure, discussed with neurology who recommends keeping her on Keppra > will need outpatient Neuro consult > consider inpatient  neurology consultation  Pulmonary hypertension > needs outpatient sleep study > resume aldactone and torsemide  LBBB/RBBB > tele  Schizophrenia  > continue her home haldol and psyche meds  DM2 > SSI  Hypertension: > resume amlodipine  Was on cephalexin and oral vancomycin prior to admission with no documentation anywhere why she was on these, will continue to hold and monitor for infection/diarrhea.  Transfer to medical telemetry floor   Best practice:  Diet: advance diet Pain/Anxiety/Delirium protocol (if indicated): APAP prn VAP protocol (if indicated): n/a DVT prophylaxis:  GI prophylaxis: n/a Glucose control: SSI Mobility: PT consult Code Status: Full Family Communication: none Disposition: to floor, TRH  Labs   CBC: Recent Labs  Lab 08/15/18 1503 08/16/18 0128 08/16/18 0150 08/16/18 0651 08/17/18 0447  WBC 8.9  --  10.7* 10.8* 7.2  NEUTROABS 6.7  --  8.1* 8.5* 5.1  HGB 9.4* 9.2* 9.7* 8.9* 8.1*  HCT 28.0* 27.0* 29.6* 26.4* 24.5*  MCV 97.9  --  96.1 97.1 96.1  PLT 205  --  217 205 456    Basic Metabolic Panel: Recent Labs  Lab 08/15/18 1503 08/16/18 0128 08/16/18 0150 08/16/18 0651 08/17/18 0447  NA 135 137 136 140 144  K 4.1 4.1 3.9 3.7 3.8  CL 104 105 105 106 114*  CO2 23  --  22 26 23   GLUCOSE 300* 325* 324* 275* 127*  BUN 35* 34* 38* 34* 23  CREATININE 2.37* 2.40* 2.38* 2.33* 1.97*  CALCIUM 8.7*  --  8.7* 8.4* 8.5*   GFR: Estimated Creatinine Clearance: 32 mL/min (A) (by C-G formula based on SCr of 1.97 mg/dL (H)). Recent Labs  Lab 08/15/18 1503 08/16/18 0129 08/16/18 0150 08/16/18 0330 08/16/18 0651 08/17/18 0447  WBC 8.9  --  10.7*  --  10.8* 7.2  LATICACIDVEN  --  2.93*  --  1.78  --   --     Liver Function Tests: Recent Labs  Lab 08/15/18 1503 08/16/18 0150 08/16/18 0651  AST 22 23 26   ALT 30 28 32  ALKPHOS 74 75 70  BILITOT 0.4 0.5 0.1*  PROT 6.4* 6.6 5.6*  ALBUMIN 3.2* 3.2* 2.7*   Recent Labs  Lab  08/15/18 1503  LIPASE 48   Recent Labs  Lab 08/16/18 0150  AMMONIA 19    ABG    Component Value Date/Time   PHART 7.294 (L) 08/16/2018 0301   PCO2ART 50.9 (H) 08/16/2018 0301   PO2ART 298 (H) 08/16/2018 0301   HCO3 22.4 08/16/2018 0301   TCO2 23 08/16/2018 0128   ACIDBASEDEF 1.7 08/16/2018 0301   O2SAT 100.0 08/16/2018 0301     Coagulation Profile: No results for input(s): INR, PROTIME in the last 168 hours.  Cardiac Enzymes: Recent Labs  Lab 08/15/18 1503  TROPONINI <0.03    HbA1C: Hgb A1c MFr Bld  Date/Time Value Ref Range Status  01/12/2018 06:24 PM 6.8 (H) 4.8 - 5.6 % Final    Comment:    (NOTE) Pre diabetes:          5.7%-6.4% Diabetes:              >6.4% Glycemic control for   <7.0% adults with diabetes   10/13/2016 03:30 AM 5.6 4.8 - 5.6 % Final    Comment:    (NOTE)         Pre-diabetes: 5.7 - 6.4         Diabetes: >6.4         Glycemic control for adults with diabetes: <7.0     CBG: Recent Labs  Lab 08/16/18 1532 08/16/18 1928 08/16/18 2348 08/17/18 0334 08/17/18 0757  GLUCAP 135* 155* 92 111* 146*    Roselie Awkward, MD [place] [place] [facility] Pager: [phone] Cell: [phone] If no response, call [phone]

## 2018-08-18 LAB — COMPREHENSIVE METABOLIC PANEL
ALT: 26 U/L (ref 0–44)
AST: 20 U/L (ref 15–41)
Albumin: 2.6 g/dL — ABNORMAL LOW (ref 3.5–5.0)
Alkaline Phosphatase: 67 U/L (ref 38–126)
Anion gap: 7 (ref 5–15)
BUN: 19 mg/dL (ref 8–23)
CO2: 22 mmol/L (ref 22–32)
Calcium: 8.8 mg/dL — ABNORMAL LOW (ref 8.9–10.3)
Chloride: 113 mmol/L — ABNORMAL HIGH (ref 98–111)
Creatinine, Ser: 1.95 mg/dL — ABNORMAL HIGH (ref 0.44–1.00)
GFR calc Af Amer: 30 mL/min — ABNORMAL LOW (ref >60)
GFR calc non Af Amer: 26 mL/min — ABNORMAL LOW (ref >60)
Glucose, Bld: 229 mg/dL — ABNORMAL HIGH (ref 70–99)
Potassium: 4 mmol/L (ref 3.5–5.1)
Sodium: 142 mmol/L (ref 135–145)
Total Bilirubin: 0.3 mg/dL (ref 0.3–1.2)
Total Protein: 5.9 g/dL — ABNORMAL LOW (ref 6.5–8.1)

## 2018-08-18 LAB — CBC WITH DIFFERENTIAL/PLATELET
Abs Immature Granulocytes: 0.03 10*3/uL (ref 0.00–0.07)
Basophils Absolute: 0 10*3/uL (ref 0.0–0.1)
Basophils Relative: 1 %
Eosinophils Absolute: 0.3 10*3/uL (ref 0.0–0.5)
Eosinophils Relative: 4 %
HCT: 27.1 % — ABNORMAL LOW (ref 36.0–46.0)
Hemoglobin: 9.2 g/dL — ABNORMAL LOW (ref 12.0–15.0)
Immature Granulocytes: 0 %
Lymphocytes Relative: 14 %
Lymphs Abs: 1 10*3/uL (ref 0.7–4.0)
MCH: 32.9 pg (ref 26.0–34.0)
MCHC: 33.9 g/dL (ref 30.0–36.0)
MCV: 96.8 fL (ref 80.0–100.0)
Monocytes Absolute: 0.6 10*3/uL (ref 0.1–1.0)
Monocytes Relative: 8 %
Neutro Abs: 5.2 10*3/uL (ref 1.7–7.7)
Neutrophils Relative %: 73 %
Platelets: 187 10*3/uL (ref 150–400)
RBC: 2.8 MIL/uL — ABNORMAL LOW (ref 3.87–5.11)
RDW: 13.8 % (ref 11.5–15.5)
WBC: 7.1 10*3/uL (ref 4.0–10.5)
nRBC: 0 % (ref 0.0–0.2)

## 2018-08-18 LAB — GLUCOSE, CAPILLARY
Glucose-Capillary: 192 mg/dL — ABNORMAL HIGH (ref 70–99)
Glucose-Capillary: 333 mg/dL — ABNORMAL HIGH (ref 70–99)
Glucose-Capillary: 175 mg/dL — ABNORMAL HIGH (ref 70–99)
Glucose-Capillary: 138 mg/dL — ABNORMAL HIGH (ref 70–99)

## 2018-08-18 LAB — VITAMIN B12: Vitamin B-12: 540 pg/mL (ref 180–914)

## 2018-08-18 MED ORDER — CARVEDILOL 6.25 MG PO TABS: 6.2500 mg | ORAL_TABLET | Freq: Two times a day (BID) | ORAL | 0 refills | Status: DC

## 2018-08-18 MED ORDER — LEVETIRACETAM 500 MG PO TABS: 500.0000 mg | ORAL_TABLET | Freq: Two times a day (BID) | ORAL | 0 refills | Status: DC

## 2018-08-18 MED ORDER — LORAZEPAM 2 MG/ML IJ SOLN
1.0000 mg | Freq: Once | INTRAMUSCULAR | Status: AC
  Administered 2018-08-18: 1 mg via INTRAVENOUS
  Filled 2018-08-18: qty 1

## 2018-08-18 MED ORDER — LEVOTHYROXINE SODIUM 25 MCG PO TABS: 25.0000 ug | ORAL_TABLET | Freq: Every day | ORAL | 0 refills | Status: DC

## 2018-08-18 MED ORDER — CARVEDILOL 6.25 MG PO TABS
6.2500 mg | ORAL_TABLET | Freq: Two times a day (BID) | ORAL | Status: DC
  Administered 2018-08-18 – 2018-08-20 (×5): 6.25 mg via ORAL
  Filled 2018-08-18 (×5): qty 1

## 2018-08-18 NOTE — Clinical Social Work Note (Signed)
Clinical Social Work Assessment  Patient Details  Name: [name] MRN: [id number] Date of Birth: 09/25/1950  Date of referral:  08/18/18               Reason for consult:  Facility Placement                Permission sought to share information with:  Facility Sport and exercise psychologist, Family Supports Permission granted to share information::  Yes, Verbal Permission Granted  Name::        Agency::  [facility]  Relationship::     Contact Information:     Housing/Transportation Living arrangements for the past 2 months:  [place] of Information:  Patient Patient Interpreter Needed:  None Criminal Activity/Legal Involvement Pertinent to Current Situation/Hospitalization:  No - Comment as needed Significant Relationships:    Lives with:  Facility Resident Do you feel safe going back to the place where you live?  Yes Need for family participation in patient care:  Yes (Comment)  Care giving concerns: Pt is alert and oriented. No family or friends present at bedside.   Social Worker assessment / plan:  CSW spoke with pt at bedside. Pt confirmed plan is to return to [facility] at d/c. CSW confirmed with facility and they are agreeable to take pt back today. They use [facility] for HH--RNCM notified. CSW will fax d/c summary once available and set up PTAR.   Employment status:  Disabled (Comment on whether or not currently receiving Disability) Insurance information:  Medicare, Medicaid [place] PT Recommendations:  Not assessed at this time Information / Referral to community resources:  Other (Comment Required)(ALF)  Patient/Family's Response to care:  Pt verbalized understanding of CSW role and expressed appreciation for support. Pt denies any concern regarding pt care at this time.   Patient/Family's Understanding of and Emotional Response to Diagnosis, Current Treatment, and Prognosis:  Pt understanding of disposition--pt agreeable. Pt denies any  concerns at this time.  Emotional Assessment Appearance:  Appears stated age Attitude/Demeanor/Rapport:  Gracious, Reactive Affect (typically observed):  Accepting, Appropriate, Calm, Pleasant Orientation:  Oriented to Self, Oriented to Place, Oriented to  Time, Oriented to Situation Alcohol / Substance use:  Not Applicable Psych involvement (Current and /or in the community):  No (Comment)  Discharge Needs  Concerns to be addressed:  Care Coordination Readmission within the last 30 days:  Yes Current discharge risk:  Dependent with Mobility, None Barriers to Discharge:  No Barriers Identified   Daemian Gahm A Shantaya Bluestone, LCSW 08/18/2018, 12:54 PM

## 2018-08-18 NOTE — Progress Notes (Signed)
Patient unable to discharge back to ALF secondary to lack of oxygen available at ALF.  Will monitor.

## 2018-08-18 NOTE — Care Management Note (Signed)
Case Management Note  Patient Details  Name: [name] MRN: [id number] Date of Birth: 10/14/1950  Subjective/Objective:  Patient for dc today back to ALF, [facility], they use [facility] for [facility] services. NCM spoke with patient to make sure this is ok with her. She states yes.  Referral given to [place] with [facility] for HHPT and [place].  Soc will begin 24-48 hrs post dc.                  Action/Plan: DC when ready.   Expected Discharge Date:                  Expected Discharge Plan:  Assisted Living / Rest Home  In-House Referral:  Clinical Social Work  Discharge planning Services  CM Consult  Post Acute Care Choice:  Home Health Choice offered to:  Patient  DME Arranged:    DME Agency:     HH Arranged:  PT, OT HH Agency:  [facility]  Status of Service:  Completed, signed off  If discussed at [place] of Stay Meetings, dates discussed:    Additional Comments:  Zenon Mayo, RN 08/18/2018, 12:36 PM

## 2018-08-18 NOTE — Progress Notes (Signed)
Patient will DC to: [facility] Anticipated DC date: 08/18/2018 Family notified: Yes Transport by: Corey Harold   Per MD patient ready for DC to . RN, patient, patient's family, and facility notified of DC. Discharge Summary and FL2 sent to facility. RN to call report prior to discharge [phone]). DC packet on chart. Ambulance transport requested for patient.   CSW will sign off for now as social work intervention is no longer needed. Please consult [place] again if new needs arise.  Lorinda Copland, LCSW-A /Clinical Social Work Department Cell: [phone]

## 2018-08-18 NOTE — Clinical Social Work Placement (Signed)
   CLINICAL SOCIAL WORK PLACEMENT  NOTE  Date:  08/18/2018  Patient Details  Name: [name] MRN: [id number] Date of Birth: 03-Oct-1950  Clinical Social Work is seeking post-discharge placement for this patient at the [place] level of care (*CSW will initial, date and re-position this form in  chart as items are completed):      Patient/family provided with [place] Work Department's list of facilities offering this level of care within the geographic area requested by the patient (or if unable, by the patient's family).      Patient/family informed of their freedom to choose among providers that offer the needed level of care, that participate in Medicare, Medicaid or managed care program needed by the patient, have an available bed and are willing to accept the patient.      Patient/family informed of [facility]'s ownership interest in [facility] and [facility], as well as of the fact that they are under no obligation to receive care at these facilities.  PASRR submitted to EDS on       PASRR number received on       Existing PASRR number confirmed on       FL2 transmitted to all facilities in geographic area requested by pt/family on       FL2 transmitted to all facilities within larger geographic area on       Patient informed that his/her managed care company has contracts with or will negotiate with certain facilities, including the following:            Patient/family informed of bed offers received.  Patient chooses bed at ([place])     Physician recommends and patient chooses bed at      Patient to be transferred to ([facility]) on 08/18/18.  Patient to be transferred to facility by PTAR     Patient family notified on 08/18/18 of transfer.  Name of family member notified:  [name], from facility     PHYSICIAN Please prepare priority discharge summary, including medications, Please  prepare prescriptions, Please sign FL2     Additional Comment:    _______________________________________________ Eileen Stanford, LCSW 08/18/2018, 12:57 PM

## 2018-08-18 NOTE — Discharge Summary (Signed)
[facility] Discharge Summary   Patient: [name] WUJ:[id number]   PCP: Glenda Chroman, MD DOB: August 31, 1950   Date of admission: 08/16/2018   Date of discharge:  08/18/2018    Discharge Diagnoses:  Acute unresponsive event secondary to seizure. Acute hypoxic respiratory failure Hypothyroidism Schizophrenia Depression Chronic kidney disease stage IV Type 2 diabetes mellitus Chronic diastolic CHF  Admitted From: ALF Disposition: ALF with home health  Recommendations for Outpatient Follow-up:  1. Please follow-up with PCP in 1 week. 2. Please follow-up with neurology as recommended.  Follow-up Information    [facility] Follow up.   Specialty:  Home Health Services Why:  HHPT, HHOT Contact information: [place] [address] [phone]        Glenda Chroman, MD. Schedule an appointment as soon as possible for a visit in 1 week(s).   Specialty:  Internal Medicine Contact information: [address] [place] [address] [phone]        [facility]. Schedule an appointment as soon as possible for a visit in 1 month(s).   Specialty:  Neurology Contact information: [address] [place] [phone]         Diet recommendation: full code  Activity: The patient is advised to gradually reintroduce usual activities.  Discharge Condition: good  Code Status: full code  History of present illness: As per the H and P dictated on admission, "[name] is a 67 year old lady with history of schizophrenia hypothyroidism diabetes mellitus type 2 chronic diastolic congestive heart failure presented with altered mental status agitation to [facility] during her stay she started screaming complaining of " seeing monsters" and then she became unresponsive with deviation of her eyes and had to be intubated for airway protection and desaturation there was concern of possible seizure  and was transferred to our hospital for further management.  Patient came in to our hospital sedated on propofol no visible seizures and obviously not able to get any history from the patient since her sedation and no family is available.  CT scan of the head did not show any acute event.  Patient had similar presentation mid of December where she was intubated there was no signs of seizures on her EEG with negative CT scan of her head and was concerned about polypharmacy causing her acute encephalopathy or catatonia."  Hospital Course:  Summary of her active problems in the hospital is as following. Acute hypoxic respiratory failure. Requiring mechanical ventilation for airway protection. Suspected seizures. Acute metabolic encephalopathy secondary to seizures. Initially admitted at any pain hospital, was intubated there for airway protection. Admitted to ICU. Patient was started on propofol. EEG was performed which did not show any evidence of active seizures. PCCM discussed with neurology who recommends keeping her on Keppra > will need outpatient Neuro consult Patient had an extensive work-up including an MRI, EEG, CT scan during prior admission as well as this admission which were unremarkable. Currently appears to be back to baseline.  Acute hypoxic respiratory failure:Intubated on admission- successfully extubated Improved-appears to be on room air this morning.  Hypertension: Relatively stable-continue Coreg, Demadex and Aldactone on discharge and amlodipine as well.    Stage IV CKD: Creatinine close to usual baseline. Follow.  Chronic diastolic heart failure: Reasonably well compensated-continue Coreg and Demadex.  Dyslipidemia:Continue statin  Uncontrolled type 2 diabetes mellitus, with renal dysfunction complication.   Continue home regimen for now. Further adjustment per PCP.  Hypothyroidism:Continue  Synthroid  Schizophrenia:Apparently has a longstanding  history of schizophrenia since she was 68 years old.  patient will need to follow-up with her primary psychiatrist in 1 week for further care.  Dysphagia:Initially kept n.p.o. postextubation-but upon follow-up by speech therapy-has been started on a dysphagia 3 diet. Per nursing staff she has been diet tolerating well.   Pulmonary hypertension > needs outpatient sleep study > resume aldactone and torsemide  Was on cephalexin and oral vancomycin prior to admission with no documentation anywhere why she was on these, will continue to hold and monitor for infection/diarrhea.  All other chronic medical condition were stable during the hospitalization.  Patient was seen by physical therapy, who recommended home health, which was arranged by Education officer, museum and case Freight forwarder. On the day of the discharge the patient's vitals were stable , and no other acute medical condition were reported by patient. the patient was felt safe to be discharge at ALF with therapy.  Consultants: PCCM  Procedures: EEG  DISCHARGE MEDICATION: Allergies as of 08/18/2018      Reactions   Benztropine Mesylate Other (See Comments)   Alopecia and white spots on eyes   Codeine Swelling, Palpitations, Other (See Comments)   Mouth Swelling and Tachycardia   Diazepam Other (See Comments)   COMA   Diphenhydramine Hcl Anxiety, Palpitations, Other (See Comments)   Tachycardia and anxiousness   Penicillins Other (See Comments)   REACTION: ulcers in mouth, swelling, throat swelling  Has patient had a PCN reaction causing immediate rash, facial/tongue/throat swelling, SOB or lightheadedness with hypotension: YES Has patient had a PCN reaction causing severe rash involving Mucus membranes or skin necrosis: Unknown Has patient had a PCN reaction that required hospitalization Unknown Has patient had a PCN reaction occurring within the last 10 years: unknown If all of the above answers are "NO", then may proceed with Cephalospori     Sulfonamide Derivatives Rash   Blisters, also   Thioridazine Hcl Swelling   Benztropine Other (See Comments)   "Alopecia and white spots on eyes"   Penicillin G Swelling, Other (See Comments)   Ulcers in mouth & tongue became swollen Has patient had a PCN reaction causing immediate rash, facial/tongue/throat swelling, SOB or lightheadedness with hypotension: Yes Has patient had a PCN reaction causing severe rash involving mucus membranes or skin necrosis: Unk Has patient had a PCN reaction that required hospitalization: Unk Has patient had a PCN reaction occurring within the last 10 years: Unk If all of the above answers are "NO", then may proceed with Cephalosporin use.   Lisinopril Cough   Sulfamethoxazole Rash      Medication List    STOP taking these medications   cephALEXin 500 MG capsule Commonly known as:  KEFLEX   vancomycin 250 MG capsule Commonly known as:  VANCOCIN     TAKE these medications   acetaminophen 325 MG tablet Commonly known as:  TYLENOL Take 325 mg by mouth 2 (two) times daily.   albuterol (2.5 MG/3ML) 0.083% nebulizer solution Commonly known as:  PROVENTIL Take 2.5 mg by nebulization every 6 (six) hours as needed for wheezing.   VENTOLIN HFA 108 (90 Base) MCG/ACT inhaler Generic drug:  albuterol Inhale 1-2 puffs into the lungs every 6 (six) hours as needed for wheezing or shortness of breath.   amLODipine 5 MG tablet Commonly known as:  NORVASC Take 1 tablet (5 mg total) by mouth daily.   aspirin 81 MG tablet Take 81 mg by mouth daily.   atorvastatin  40 MG tablet Commonly known as:  LIPITOR Take 40 mg by mouth at bedtime.   carvedilol 6.25 MG tablet Commonly known as:  COREG Take 1 tablet (6.25 mg total) by mouth 2 (two) times daily with a meal.   DAILY VITE PO Take 1 tablet by mouth daily.   docusate sodium 100 MG capsule Commonly known as:  COLACE Take 100 mg by mouth daily.   EPINEPHrine 0.3 mg/0.3 mL Devi Commonly known as:   EPI-PEN Inject 0.3 mg into the muscle once as needed (for bee stings, then go to the ED immediately after using).   FLEET LIQUID GLYCERIN SUPP RE Place 1 suppository rectally daily as needed (for constipation).   glipiZIDE 5 MG 24 hr tablet Commonly known as:  GLUCOTROL XL Take 5 mg by mouth 2 (two) times daily.   haloperidol 0.5 MG tablet Commonly known as:  HALDOL Take 1 tablet (0.5 mg total) by mouth 2 (two) times daily.   hydrocortisone 2.5 % ointment Apply 1 application topically daily as needed (itchy skin).   levETIRAcetam 500 MG tablet Commonly known as:  KEPPRA Take 1 tablet (500 mg total) by mouth 2 (two) times daily.   levothyroxine 25 MCG tablet Commonly known as:  SYNTHROID, LEVOTHROID Take 1 tablet (25 mcg total) by mouth daily at 6 (six) AM. Start taking on:  August 19, 2018   linagliptin 5 MG Tabs tablet Commonly known as:  TRADJENTA Take 5 mg by mouth daily.   loperamide 2 MG capsule Commonly known as:  IMODIUM Take 2 mg by mouth 3 (three) times daily as needed (for diarrhea).   montelukast 10 MG tablet Commonly known as:  SINGULAIR Take 10 mg by mouth at bedtime.   omeprazole 20 MG capsule Commonly known as:  PRILOSEC Take 20 mg by mouth daily.   OVER THE COUNTER MEDICATION CPAP   polyethylene glycol powder powder Commonly known as:  GLYCOLAX/MIRALAX Take 17 g by mouth daily as needed (for constipation).   pyridOXINE 100 MG tablet Commonly known as:  VITAMIN B-6 Take 200 mg by mouth 2 (two) times daily.   spironolactone 25 MG tablet Commonly known as:  ALDACTONE Take 1 tablet (25 mg total) by mouth daily.   torsemide 20 MG tablet Commonly known as:  DEMADEX Take 60 mg alternating with 40 mg daily. What changed:    how much to take  how to take this  when to take this  additional instructions   TRESIBA FLEXTOUCH 100 UNIT/ML Sopn FlexTouch Pen Generic drug:  insulin degludec Inject 20 Units into the skin at bedtime.   Vitamin  D3 50 MCG (2000 UT) Tabs Take 4,000 Units by mouth daily.      Allergies  Allergen Reactions  . Benztropine Mesylate Other (See Comments)    Alopecia and white spots on eyes  . Codeine Swelling, Palpitations and Other (See Comments)    Mouth Swelling and Tachycardia   . Diazepam Other (See Comments)    COMA  . Diphenhydramine Hcl Anxiety, Palpitations and Other (See Comments)    Tachycardia and anxiousness  . Penicillins Other (See Comments)    REACTION: ulcers in mouth, swelling, throat swelling  Has patient had a PCN reaction causing immediate rash, facial/tongue/throat swelling, SOB or lightheadedness with hypotension: YES Has patient had a PCN reaction causing severe rash involving Mucus membranes or skin necrosis: Unknown Has patient had a PCN reaction that required hospitalization Unknown Has patient had a PCN reaction occurring within the last 10 years:  unknown If all of the above answers are "NO", then may proceed with Cephalospori  . Sulfonamide Derivatives Rash    Blisters, also  . Thioridazine Hcl Swelling  . Benztropine Other (See Comments)    "Alopecia and white spots on eyes"  . Penicillin G Swelling and Other (See Comments)    Ulcers in mouth & tongue became swollen Has patient had a PCN reaction causing immediate rash, facial/tongue/throat swelling, SOB or lightheadedness with hypotension: Yes Has patient had a PCN reaction causing severe rash involving mucus membranes or skin necrosis: Unk Has patient had a PCN reaction that required hospitalization: Unk Has patient had a PCN reaction occurring within the last 10 years: Unk If all of the above answers are "NO", then may proceed with Cephalosporin use.   [name] Kitchen Lisinopril Cough  . Sulfamethoxazole Rash   Discharge Instructions    Ambulatory referral to Neurology   Complete by:  As directed    An appointment is requested in approximately: 4 weeks   DIET DYS 3   Complete by:  As directed    Fluid consistency:   Thin   Discharge instructions   Complete by:  As directed    It is important that you read following instructions as well as go over your medication list with RN to help you understand your care after this hospitalization.  Discharge Instructions: Please follow-up with PCP in one week  Please request your primary care physician to go over all Hospital Tests and Procedure/Radiological results at the follow up,  Please get all Hospital records sent to your PCP by signing hospital release before you go home.   Do not drive, operating heavy machinery, perform activities at heights, swimming or participation in water activities or provide baby sitting services; until you have been seen by Primary Care Physician or a Neurologist and advised to do so again. Do not take more than prescribed Pain, Sleep and Anxiety Medications. You were cared for by a hospitalist during your hospital stay. If you have any questions about your discharge medications or the care you received while you were in the hospital after you are discharged, you can call the unit you were admitted to and ask to speak with the hospitalist on call if the hospitalist that took care of you is not available.  Once you are discharged, your primary care physician will handle any further medical issues. Please note that NO REFILLS for any discharge medications will be authorized once you are discharged, as it is imperative that you return to your primary care physician (or establish a relationship with a primary care physician if you do not have one) for your aftercare needs so that they can reassess your need for medications and monitor your lab values. You Must read complete instructions/literature along with all the possible adverse reactions/side effects for all the Medicines you take and that have been prescribed to you. Take any new Medicines after you have completely understood and accept all the possible adverse reactions/side  effects. Wear Seat belts while driving. If you have smoked or chewed Tobacco in the last 2 yrs please stop smoking and/or stop any Recreational drug use.   Increase activity slowly   Complete by:  As directed      Discharge Exam: Filed Weights   08/16/18 0600 08/16/18 2000 08/17/18 0415  Weight: 100.9 kg 100.9 kg 101.9 kg   Vitals:   08/17/18 2336 08/18/18 0818  BP: (!) 164/76 (!) 185/72  Pulse: 84 87  Resp:  20 20  Temp: 97.9 F (36.6 C) 98.3 F (36.8 C)  SpO2: 98% 100%   General: Appear in no distress, no Rash; Oral Mucosa moist. Cardiovascular: S1 and S2 Present, no Murmur, no JVD Respiratory: Bilateral Air entry present and Clear to Auscultation, no Crackles, no wheezes Abdomen: Bowel Sound present, Soft and no tenderness Extremities: no Pedal edema, no calf tenderness Neurology: Grossly no focal neuro deficit.  The results of significant diagnostics from this hospitalization (including imaging, microbiology, ancillary and laboratory) are listed below for reference.    Significant Diagnostic Studies: Ct Head Wo Contrast  Result Date: 08/16/2018 CLINICAL DATA:  Acute onset of altered mental status. Patient unresponsive. EXAM: CT HEAD WITHOUT CONTRAST TECHNIQUE: Contiguous axial images were obtained from the base of the skull through the vertex without intravenous contrast. COMPARISON:  CT of the head performed 08/11/2018 FINDINGS: Brain: No evidence of acute infarction, hemorrhage, hydrocephalus, extra-axial collection or mass lesion/mass effect. Mild periventricular white matter change likely reflects small vessel ischemic microangiopathy. A chronic lacunar infarct is noted at the left basal ganglia. The posterior fossa, including the cerebellum, brainstem and fourth ventricle, is within normal limits. The third and lateral ventricles are unremarkable in appearance. The cerebral hemispheres are symmetric in appearance, with normal gray-white differentiation. No mass effect or  midline shift is seen. Vascular: No hyperdense vessel or unexpected calcification. Skull: There is no evidence of fracture; visualized osseous structures are unremarkable in appearance. Sinuses/Orbits: The orbits are within normal limits. Mucosal thickening is noted at the maxillary sinuses bilaterally, with a likely mucus retention cyst or polyp at the left maxillary sinus. The remaining paranasal sinuses and mastoid air cells are well-aerated. Other: No significant soft tissue abnormalities are seen. IMPRESSION: 1. No acute intracranial pathology seen on CT. 2. Mild small vessel ischemic microangiopathy. 3. 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 4. Mucosal thickening at the maxillary sinuses bilaterally, with a likely mucus retention cyst or polyp at the left maxillary sinus. Electronically Signed   By: Garald Balding M.D.   On: 08/16/2018 02:23   Mr Jodene Nam Head Wo Contrast  Result Date: 07/27/2018 CLINICAL DATA:  Acute onset somnolence.  Altered mental status. EXAM: MRI HEAD WITHOUT CONTRAST MRA HEAD WITHOUT CONTRAST TECHNIQUE: Multiplanar, multiecho pulse sequences of the brain and surrounding structures were obtained without intravenous contrast. Angiographic images of the head were obtained using MRA technique without contrast. COMPARISON:  Head CT 07/27/2018 Brain MRI 04/19/2018 FINDINGS: An abbreviated protocol was performed. Sagittal T1, axial T2, axial T1 and coronal T2-weighted imaging was not obtained. MRI HEAD FINDINGS BRAIN: There is no acute infarct, acute hemorrhage or mass effect. The midline structures are normal. There are no old infarcts. Multifocal white matter hyperintensity, most commonly due to chronic ischemic microangiopathy. Mild generalized atrophy. SKULL AND UPPER CERVICAL SPINE: The visualized skull base, calvarium, upper cervical spine and extracranial soft tissues are normal. SINUSES/ORBITS: No fluid levels or advanced mucosal thickening. No mastoid or middle ear  effusion. The orbits are normal. MRA HEAD FINDINGS Intracranial internal carotid arteries: Normal. Anterior cerebral arteries: Normal. Middle cerebral arteries: Normal. Posterior communicating arteries: Present on the right only. Posterior cerebral arteries: Normal. Basilar artery: Normal. Vertebral arteries: Left dominant. Normal. Superior cerebellar arteries: Normal. Inferior cerebellar arteries: Normal. IMPRESSION: 1. No acute intracranial abnormality. 2. Findings of chronic microvascular ischemia. 3. Normal intracranial MRA. Electronically Signed   By: Ulyses Jarred M.D.   On: 07/27/2018 19:56   Mr Brain Wo Contrast  Result Date: 07/27/2018 CLINICAL  DATA:  Acute onset somnolence.  Altered mental status. EXAM: MRI HEAD WITHOUT CONTRAST MRA HEAD WITHOUT CONTRAST TECHNIQUE: Multiplanar, multiecho pulse sequences of the brain and surrounding structures were obtained without intravenous contrast. Angiographic images of the head were obtained using MRA technique without contrast. COMPARISON:  Head CT 07/27/2018 Brain MRI 04/19/2018 FINDINGS: An abbreviated protocol was performed. Sagittal T1, axial T2, axial T1 and coronal T2-weighted imaging was not obtained. MRI HEAD FINDINGS BRAIN: There is no acute infarct, acute hemorrhage or mass effect. The midline structures are normal. There are no old infarcts. Multifocal white matter hyperintensity, most commonly due to chronic ischemic microangiopathy. Mild generalized atrophy. SKULL AND UPPER CERVICAL SPINE: The visualized skull base, calvarium, upper cervical spine and extracranial soft tissues are normal. SINUSES/ORBITS: No fluid levels or advanced mucosal thickening. No mastoid or middle ear effusion. The orbits are normal. MRA HEAD FINDINGS Intracranial internal carotid arteries: Normal. Anterior cerebral arteries: Normal. Middle cerebral arteries: Normal. Posterior communicating arteries: Present on the right only. Posterior cerebral arteries: Normal. Basilar  artery: Normal. Vertebral arteries: Left dominant. Normal. Superior cerebellar arteries: Normal. Inferior cerebellar arteries: Normal. IMPRESSION: 1. No acute intracranial abnormality. 2. Findings of chronic microvascular ischemia. 3. Normal intracranial MRA. Electronically Signed   By: Ulyses Jarred M.D.   On: 07/27/2018 19:56   US Venous Img Lower Bilateral  Result Date: 08/15/2018 CLINICAL DATA:  Bilateral lower extremity pain and edema since this morning. Evaluate for DVT. EXAM: BILATERAL LOWER EXTREMITY VENOUS DOPPLER ULTRASOUND TECHNIQUE: Gray-scale sonography with graded compression, as well as color Doppler and duplex ultrasound were performed to evaluate the lower extremity deep venous systems from the level of the common femoral vein and including the common femoral, femoral, profunda femoral, popliteal and calf veins including the posterior tibial, peroneal and gastrocnemius veins when visible. The superficial great saphenous vein was also interrogated. Spectral Doppler was utilized to evaluate flow at rest and with distal augmentation maneuvers in the common femoral, femoral and popliteal veins. COMPARISON:  None. FINDINGS: RIGHT LOWER EXTREMITY Common Femoral Vein: No evidence of thrombus. Normal compressibility, respiratory phasicity and response to augmentation. Saphenofemoral Junction: No evidence of thrombus. Normal compressibility and flow on color Doppler imaging. Profunda Femoral Vein: No evidence of thrombus. Normal compressibility and flow on color Doppler imaging. Femoral Vein: No evidence of thrombus. Normal compressibility, respiratory phasicity and response to augmentation. Popliteal Vein: No evidence of thrombus. Normal compressibility, respiratory phasicity and response to augmentation. Calf Veins: No evidence of thrombus. Normal compressibility and flow on color Doppler imaging. Superficial Great Saphenous Vein: No evidence of thrombus. Normal compressibility. Venous Reflux:  None.  Other Findings:  None. LEFT LOWER EXTREMITY Common Femoral Vein: No evidence of thrombus. Normal compressibility, respiratory phasicity and response to augmentation. Saphenofemoral Junction: No evidence of thrombus. Normal compressibility and flow on color Doppler imaging. Profunda Femoral Vein: No evidence of thrombus. Normal compressibility and flow on color Doppler imaging. Femoral Vein: No evidence of thrombus. Normal compressibility, respiratory phasicity and response to augmentation. Popliteal Vein: No evidence of thrombus. Normal compressibility, respiratory phasicity and response to augmentation. Calf Veins: No evidence of thrombus. Normal compressibility and flow on color Doppler imaging. Superficial Great Saphenous Vein: No evidence of thrombus. Normal compressibility. Venous Reflux:  None. Other Findings:  None. IMPRESSION: No evidence of DVT within either lower extremity. Electronically Signed   By: Sandi Mariscal M.D.   On: 08/15/2018 17:09   Dg Chest Port 1 View  Result Date: 08/16/2018 CLINICAL DATA:  Intubation EXAM: PORTABLE CHEST  1 VIEW COMPARISON:  08/16/2018 at 1:43 a.m. FINDINGS: Endotracheal tube tip is just below the level of the clavicles, approximately 1.3 cm above the inferior margin of the carina. The enteric tube tip is beyond the field of view. The side port is not clearly visualized. There is shallow lung inflation with bibasilar atelectasis. Unchanged cardiomegaly. IMPRESSION: Endotracheal tube tip just below the level of the clavicles, approximately 1.3 cm above the inferior margin of the carina. Enteric tube tip beyond the field of view. Electronically Signed   By: Ulyses Jarred M.D.   On: 08/16/2018 06:22   Dg Chest Port 1 View  Result Date: 08/16/2018 CLINICAL DATA:  Acute onset of altered mental status. Endotracheal tube placement. EXAM: PORTABLE CHEST 1 VIEW COMPARISON:  Chest radiograph performed 08/15/2018 FINDINGS: The patient's endotracheal tube is seen ending 1.5 cm above  the carina. This could be retracted 2 cm. The patient's enteric tube is seen ending overlying the body of the stomach, with the side port about the gastroesophageal junction. This could be advanced 3 cm. The lungs are mildly hypoexpanded. Vascular congestion and vascular crowding are noted. No pleural effusion or pneumothorax is seen The cardiomediastinal silhouette is mildly enlarged. No acute osseous abnormalities are identified. IMPRESSION: 1. Endotracheal tube seen ending 1.5 cm above the carina. This could be retracted 2 cm. 2. Enteric tube noted ending overlying the body of the stomach, with the side port about the gastroesophageal junction. This could be advanced 3 cm. 3. Lungs mildly hypoexpanded but grossly clear. Vascular congestion and mild cardiomegaly noted. Electronically Signed   By: Garald Balding M.D.   On: 08/16/2018 02:03   Dg Chest Port 1 View  Result Date: 08/15/2018 CLINICAL DATA:  68 year old female with chest pain and weakness. EXAM: PORTABLE CHEST 1 VIEW COMPARISON:  [facility] 08/11/2018 and earlier. FINDINGS: Portable AP upright view at 1453 hours. Low but improved lung volumes. Normal cardiac size and mediastinal contours. Visualized tracheal air column is within normal limits. Allowing for portable technique the lungs are clear. No pneumothorax. Paucity of bowel gas in the upper abdomen. Visible osseous structures appear intact. IMPRESSION: No acute cardiopulmonary abnormality. Electronically Signed   By: Genevie Ann M.D.   On: 08/15/2018 15:07   Dg Chest Port 1 View  Result Date: 07/28/2018 CLINICAL DATA:  Hypoxia EXAM: PORTABLE CHEST 1 VIEW COMPARISON:  July 27, 2018 FINDINGS: Endotracheal tube tip is 2.2 cm above the carina. Nasogastric tube tip and side port are below the diaphragm. No pneumothorax. There is atelectatic change in the lung bases. The lungs elsewhere are clear. Heart is mildly enlarged with pulmonary vascularity normal. No adenopathy. No  appreciable bone lesions. IMPRESSION: Tube positions as described without pneumothorax. Bibasilar atelectasis. No frank airspace consolidation. Stable cardiac prominence. Electronically Signed   By: Lowella Grip III M.D.   On: 07/28/2018 07:22   Dg Chest Portable 1 View  Result Date: 07/27/2018 CLINICAL DATA:  Tube placement. EXAM: PORTABLE CHEST 1 VIEW COMPARISON:  07/14/2018. FINDINGS: Endotracheal tube tip noted approximately 7 mm above the lower portion of the carina. Proximal repositioning of approximately 2 cm should be considered. NG tube noted with tip below left hemidiaphragm. Cardiomegaly with pulmonary venous congestion bilateral interstitial prominence. Findings consistent with CHF. No pneumothorax. IMPRESSION: 1. Endotracheal tube noted with its tip approximately 7 mm above the lower portion of the carina. Proximal repositioning of approximately 2 cm should be considered. NG tube noted with tip below left hemidiaphragm. 2. Cardiomegaly with pulmonary venous  congestion bilateral interstitial prominence consistent with CHF. Electronically Signed   By: Marcello Moores  Register   On: 07/27/2018 16:23   Dg Swallowing Func-speech Pathology  Result Date: 08/17/2018 Objective Swallowing Evaluation: Type of Study: MBS-Modified Barium Swallow Study  Patient Details Name: Anzal Bartnick MRN: [id number] Date of Birth: September 19, 1950 Today's Date: 08/17/2018 Time: SLP Start Time (ACUTE ONLY): 1007 -SLP Stop Time (ACUTE ONLY): 1023 SLP Time Calculation (min) (ACUTE ONLY): 16 min Past Medical History: Past Medical History: Diagnosis Date . Anxiety  . Asthma  . Back pain  . Barrett esophagus  . CHF (congestive heart failure) ([place])  . CKD (chronic kidney disease) stage 3, GFR 30-59 ml/min (HCC)  . COPD (chronic obstructive pulmonary disease) ([place])  . Depression  . Diastolic heart failure ([place])  . Essential hypertension  . Fibromyalgia  . GERD (gastroesophageal reflux disease)  . Gout  . History of pericarditis  .  Hyperlipidemia  . Hypothyroid  . Major depressive disorder  . Memory loss  . Nephrolithiasis   2008 . Obesity hypoventilation syndrome (HCC)   CPAP . Obstructive sleep apnea  . PE (pulmonary embolism)   2010 . Peripheral neuropathy  . PTSD (post-traumatic stress disorder)  . Rheumatoid arthritis ([place])  . Schizophrenia ([place])  . Steroid dependence  . Type 2 diabetes mellitus ([place])  . Vitamin D deficiency  Past Surgical History: Past Surgical History: Procedure Laterality Date . APPENDECTOMY   . CHOLECYSTECTOMY   . COLONOSCOPY    benign polyps (12/2000), repeat colonoscopy performed in 2007 . ENDOMETRIAL BIOPSY    10/03 benign columnar mucosa (limited material) . KIDNEY SURGERY   HPI: Pt is a 68 year old lady admitted with AMS and suspected seizure, intubated 1/1 for <24 hours. Pt was recently admitted (December 2019) with similar presentation, with SLP clinical evaluation initially recommending NPO but advanced to Dys 3, thin liquids prior to d/c. Pt says she gets "choked" on liquids "all the time" and believes she has had a swallow study before but there is no documentation of this in her chart. PMH includes: schizophrenia, DM, RA, PTSD, PE, OSA, memory loss, GERD, COPD, CKD, CHF, barrett's esophagus, asthma, anxiety  Subjective: pt says she coughs with liquids "a lot" and that it has been going on for "a long time" but cannot specify further Assessment / Plan / Recommendation CHL IP CLINICAL IMPRESSIONS 08/17/2018 Clinical Impression Pt has a mild oropharyngeal dysphagia due to impaired timing of airway closure, resulting in aspiration during the swallow with thin liquids. Pt also has mild lingual residue after the swallow with all consistencies, although with oral residuals spilling posteriorly into the pharynx with thin liquids. This did not result in any further aspiration during the study, but positioning in bed and impulsivity could allow for aspiration after the swallow during meals, particularly as pt does not  spontaneously manage her residuals. When pt does aspirate during the swallow it is consistently sensed, although sometimes with a brief delay (several seconds), and her cough does not eject aspirates from her airway. A chin tuck with thin liquids helps to reduce airway compromise to flash penetration. Flash penetration occurs with nectar thick liquids as well without use of chin tuck, even as pt takes very large sips. Recommend Dys 3 diet and thin liquids with chin tuck. Will f/u for tolerance and ability to implement strategies consistently. If pt continues to cough at bedside, downgrade to nectar thick liquids could be considered. SLP Visit Diagnosis Dysphagia, oropharyngeal phase (R13.12) Attention and concentration deficit following -- Frontal  lobe and executive function deficit following -- Impact on safety and function Mild aspiration risk   CHL IP TREATMENT RECOMMENDATION 08/17/2018 Treatment Recommendations Therapy as outlined in treatment plan below   Prognosis 08/17/2018 Prognosis for Safe Diet Advancement Good Barriers to Reach Goals Time post onset Barriers/Prognosis Comment -- CHL IP DIET RECOMMENDATION 08/17/2018 SLP Diet Recommendations Dysphagia 3 (Mech soft) solids;Thin liquid Liquid Administration via Cup;Straw Medication Administration Whole meds with puree Compensations Minimize environmental distractions;Slow rate;Small sips/bites;Chin tuck Postural Changes Seated upright at 90 degrees;Remain semi-upright after after feeds/meals (Comment)   CHL IP OTHER RECOMMENDATIONS 08/17/2018 Recommended Consults -- Oral Care Recommendations Oral care BID Other Recommendations --   CHL IP FOLLOW UP RECOMMENDATIONS 08/17/2018 Follow up Recommendations (No Data)   CHL IP FREQUENCY AND DURATION 08/17/2018 Speech Therapy Frequency (ACUTE ONLY) min 2x/week Treatment Duration 2 weeks      CHL IP ORAL PHASE 08/17/2018 Oral Phase Impaired Oral - Pudding Teaspoon -- Oral - Pudding Cup -- Oral - Honey Teaspoon -- Oral - Honey Cup  -- Oral - Nectar Teaspoon -- Oral - Nectar Cup Lingual/palatal residue Oral - Nectar Straw Lingual/palatal residue Oral - Thin Teaspoon -- Oral - Thin Cup Lingual/palatal residue Oral - Thin Straw Lingual/palatal residue Oral - Puree Lingual/palatal residue Oral - Mech Soft Lingual/palatal residue Oral - Regular -- Oral - Multi-Consistency -- Oral - Pill -- Oral Phase - Comment --  CHL IP PHARYNGEAL PHASE 08/17/2018 Pharyngeal Phase Impaired Pharyngeal- Pudding Teaspoon -- Pharyngeal -- Pharyngeal- Pudding Cup -- Pharyngeal -- Pharyngeal- Honey Teaspoon -- Pharyngeal -- Pharyngeal- Honey Cup -- Pharyngeal -- Pharyngeal- Nectar Teaspoon -- Pharyngeal -- Pharyngeal- Nectar Cup Delayed swallow initiation-vallecula Pharyngeal -- Pharyngeal- Nectar Straw Penetration/Aspiration during swallow Pharyngeal Material enters airway, remains ABOVE vocal cords then ejected out Pharyngeal- Thin Teaspoon -- Pharyngeal -- Pharyngeal- Thin Cup Penetration/Aspiration during swallow Pharyngeal Material enters airway, passes BELOW cords and not ejected out despite cough attempt by patient Pharyngeal- Thin Straw Penetration/Aspiration during swallow;Compensatory strategies attempted (with notebox) Pharyngeal Material enters airway, passes BELOW cords and not ejected out despite cough attempt by patient Pharyngeal- Puree WFL Pharyngeal -- Pharyngeal- Mechanical Soft WFL Pharyngeal -- Pharyngeal- Regular -- Pharyngeal -- Pharyngeal- Multi-consistency -- Pharyngeal -- Pharyngeal- Pill -- Pharyngeal -- Pharyngeal Comment --  CHL IP CERVICAL ESOPHAGEAL PHASE 08/17/2018 Cervical Esophageal Phase WFL Pudding Teaspoon -- Pudding Cup -- Honey Teaspoon -- Honey Cup -- Nectar Teaspoon -- Nectar Cup -- Nectar Straw -- Thin Teaspoon -- Thin Cup -- Thin Straw -- Puree -- Mechanical Soft -- Regular -- Multi-consistency -- Pill -- Cervical Esophageal Comment -- [name] 08/17/2018, 11:24 AM  [name], M.A. CCC-SLP Acute Rehabilitation  Services Pager [phone] Office [phone]             Ct Head Code Stroke Wo Contrast  Result Date: 07/27/2018 CLINICAL DATA:  Code stroke.  Altered mental status EXAM: CT HEAD WITHOUT CONTRAST TECHNIQUE: Contiguous axial images were obtained from the base of the skull through the vertex without intravenous contrast. COMPARISON:  Head CT 04/18/2018 FINDINGS: Brain: There is no mass, hemorrhage or extra-axial collection. The size and configuration of the ventricles and extra-axial CSF spaces are normal. There is a prominent perivascular space of the left lentiform nucleus, unchanged. Mild white matter hypoattenuation compatible with chronic small vessel disease. Vascular: No abnormal hyperdensity of the major intracranial arteries or dural venous sinuses. No intracranial atherosclerosis. Skull: The visualized skull base, calvarium and extracranial soft tissues are normal. Sinuses/Orbits: No fluid levels or advanced  mucosal thickening of the visualized paranasal sinuses. No mastoid or middle ear effusion. The orbits are normal. ASPECTS [facility] Stroke Program Early CT Score) - Ganglionic level infarction (caudate, lentiform nuclei, internal capsule, insula, M1-M3 cortex): 7 - Supraganglionic infarction (M4-M6 cortex): 3 Total score (0-10 with 10 being normal): 10 IMPRESSION: 1. No hemorrhage or other acute abnormality. 2. ASPECTS is 10. These results were called by telephone at the time of interpretation on 07/27/2018 at 3:07 pm to Dr. Nanda Quinton , who verbally acknowledged these results. Electronically Signed   By: Ulyses Jarred M.D.   On: 07/27/2018 15:08    Microbiology: Recent Results (from the past 240 hour(s))  Urine Culture     Status: None   Collection Time: 08/16/18  1:58 AM  Result Value Ref Range Status   Specimen Description   Final    URINE, RANDOM Performed at [facility], [address] [place], [place] [address]    Special Requests   Final    NONE Performed at [facility], [address] [place], [place] [address]    Culture   Final    NO GROWTH Performed at [facility], [place] [address], [place], [place] [address]    Report Status 08/17/2018 FINAL  Final  Culture, blood (Routine X 2) w Reflex to ID Panel     Status: None (Preliminary result)   Collection Time: 08/16/18  2:40 AM  Result Value Ref Range Status   Specimen Description BLOOD LEFT HAND  Final   Special Requests   Final    BOTTLES DRAWN AEROBIC AND ANAEROBIC Blood Culture adequate volume   Culture   Final    NO GROWTH 2 DAYS Performed at [facility], [address], [place], [place] [address]    Report Status PENDING  Incomplete  Culture, blood (Routine X 2) w Reflex to ID Panel     Status: None (Preliminary result)   Collection Time: 08/16/18  2:48 AM  Result Value Ref Range Status   Specimen Description BLOOD RIGHT HAND  Final   Special Requests   Final    BOTTLES DRAWN AEROBIC AND ANAEROBIC Blood Culture adequate volume   Culture   Final    NO GROWTH 2 DAYS Performed at [facility], [address], [place], [place] [address]    Report Status PENDING  Incomplete  MRSA PCR Screening     Status: None   Collection Time: 08/16/18  5:49 AM  Result Value Ref Range Status   MRSA by PCR NEGATIVE NEGATIVE Final    Comment:        The GeneXpert MRSA Assay (FDA approved for NASAL specimens only), is one component of a comprehensive MRSA colonization surveillance program. It is not intended to diagnose MRSA infection nor to guide or monitor treatment for MRSA infections. Performed at [facility], [place] [address], [place], [place] [address]   Urine culture     Status: None   Collection Time: 08/16/18  6:27 AM  Result Value Ref Range Status   Specimen Description URINE, CATHETERIZED  Final   Special Requests NONE  Final   Culture   Final    NO GROWTH Performed at [facility], [address], [place], [place] [address]    Report Status 08/17/2018 FINAL  Final      Labs: CBC: Recent Labs  Lab 08/15/18 1503 08/16/18 0128 08/16/18 0150 08/16/18 0651 08/17/18 0447 08/18/18 0832  WBC 8.9  --  10.7* 10.8*  7.2 7.1  NEUTROABS 6.7  --  8.1* 8.5* 5.1 5.2  HGB 9.4* 9.2* 9.7* 8.9* 8.1* 9.2*  HCT 28.0* 27.0* 29.6* 26.4* 24.5* 27.1*  MCV 97.9  --  96.1 97.1 96.1 96.8  PLT 205  --  217 205 186 121   Basic Metabolic Panel: Recent Labs  Lab 08/15/18 1503 08/16/18 0128 08/16/18 0150 08/16/18 0651 08/17/18 0447 08/18/18 0832  NA 135 137 136 140 144 142  K 4.1 4.1 3.9 3.7 3.8 4.0  CL 104 105 105 106 114* 113*  CO2 23  --  22 26 23 22   GLUCOSE 300* 325* 324* 275* 127* 229*  BUN 35* 34* 38* 34* 23 19  CREATININE 2.37* 2.40* 2.38* 2.33* 1.97* 1.95*  CALCIUM 8.7*  --  8.7* 8.4* 8.5* 8.8*   Liver Function Tests: Recent Labs  Lab 08/15/18 1503 08/16/18 0150 08/16/18 0651 08/18/18 0832  AST 22 23 26 20   ALT 30 28 32 26  ALKPHOS 74 75 70 67  BILITOT 0.4 0.5 0.1* 0.3  PROT 6.4* 6.6 5.6* 5.9*  ALBUMIN 3.2* 3.2* 2.7* 2.6*   Recent Labs  Lab 08/15/18 1503  LIPASE 48   Recent Labs  Lab 08/16/18 0150  AMMONIA 19   Cardiac Enzymes: Recent Labs  Lab 08/15/18 1503  TROPONINI <0.03   BNP (last 3 results) Recent Labs    01/12/18 1056 08/15/18 1503  BNP 145.3* 100.0   CBG: Recent Labs  Lab 08/17/18 1233 08/17/18 1702 08/17/18 2109 08/18/18 0743 08/18/18 1243  GLUCAP 231* 248* 282* 192* 333*   Time spent: 35 minutes  Signed:  Berle Mull  [facility]  08/18/2018  , 3:48 PM

## 2018-08-18 NOTE — NC FL2 (Signed)
[place] LEVEL OF CARE SCREENING TOOL     IDENTIFICATION  Patient Name: [name] Birthdate: [birth year] Sex: female Admission Date (Current Location): 08/16/2018  [facility] and [place] Number:  [employer] and Address:  [facility], [place] [address], [place], [place] [address]      Provider Number: [id number]  Attending Physician Name and Address:  Lavina Hamman, MD  Relative Name and Phone Number:  Mady Haagensen; niece; [phone]    Current Level of Care: Hospital Recommended Level of Care: [place] Prior Approval Number:    Date Approved/Denied:   PASRR Number:    Discharge Plan: Other (Comment)    Current Diagnoses: Patient Active Problem List   Diagnosis Date Noted  . Respiratory failure ([employer]) 08/16/2018  . Seizure ([place]) 08/16/2018  . Acute encephalopathy 07/27/2018  . Endotracheally intubated 07/27/2018  . Hypothyroidism 07/27/2018  . Pleuritic chest pain 07/14/2018  . Moderate persistent asthma without complication   . Type 2 diabetes mellitus ([place]) 04/19/2018  . Acute kidney injury superimposed on chronic kidney disease ([place]) 01/13/2018  . Altered mental status   . Encephalopathy 10/11/2016  . Chest pain 08/12/2016  . CKD (chronic kidney disease), stage IV ([place]) 08/12/2016  . History of pulmonary embolus (PE) 08/12/2016  . Obesity, Class III, BMI 40-49.9 (morbid obesity) ([place]) 08/12/2016  . RBBB 08/12/2016  . Positive D dimer 08/12/2016  . CVA (cerebral infarction) 11/10/2015  . Physical deconditioning 07/01/2011  . Weakness 07/01/2011  . Diabetic foot ulcer ([place]) 05/19/2011  . Polypharmacy 02/09/2011  . BACK PAIN WITH RADICULOPATHY 05/25/2010  . Type 2 diabetes mellitus with diabetic polyneuropathy, without long-term current use of insulin ([place]) 08/18/2009  . Chronic diastolic heart failure ([place]) 04/25/2009  . Normocytic anemia 04/14/2009  . Sleep apnea 12/11/2008  . Diabetic  peripheral neuropathy ([place]) 05/04/2007  . HYPERCHOLESTEROLEMIA 10/13/2006  . Gout, unspecified 10/13/2006  . HYPOKALEMIA 10/13/2006  . Schizophrenia ([place]) 10/13/2006  . Depression 10/13/2006  . Essential hypertension 10/13/2006  . Venous (peripheral) insufficiency 10/13/2006  . RHINITIS, ALLERGIC 10/13/2006  . Asthma 10/13/2006  . Reflux esophagitis 10/13/2006  . OSTEOARTHRITIS, MULTI SITES 10/13/2006  . INCONTINENCE, URGE 10/13/2006    Orientation RESPIRATION BLADDER Height & Weight     Self, Time, Situation, Place  O2(Nasal Cannula 2L) External catheter(08/17/2018) Weight: 224 lb 10.4 oz (101.9 kg) Height:  5\' 3"  (160 cm)  BEHAVIORAL SYMPTOMS/MOOD NEUROLOGICAL BOWEL NUTRITION STATUS      Continent Diet(DYS 3 diet, thin liquids)  AMBULATORY STATUS COMMUNICATION OF NEEDS Skin   Limited Assist Verbally Skin abrasions(Diabetic Ulcer, right foot)                       Personal Care Assistance Level of Assistance  Bathing, Feeding, Dressing Bathing Assistance: Limited assistance Feeding assistance: Independent Dressing Assistance: Limited assistance     Functional Limitations Info  Sight, Hearing, Speech Sight Info: Adequate Hearing Info: Adequate Speech Info: Adequate    SPECIAL CARE FACTORS FREQUENCY                       Contractures Contractures Info: Not present    Additional Factors Info  Code Status, Allergies Code Status Info: Full COde Allergies Info: Benztropine Mesylate, Codeine, Diazepam, Diphenhydramine Hcl, Penicillins, Sulfonamide Derivatives, Thioridazine Hcl, Benztropine, Penicillin G, Lisinopril, Sulfamethoxazole           Current Medications (08/18/2018):  This is the current hospital active  medication list Current Facility-Administered Medications  Medication Dose Route Frequency Provider Last Rate Last Dose  . acetaminophen (TYLENOL) tablet 650 mg  650 mg Oral Q6H PRN Juanito Doom, MD   650 mg at 08/17/18 2203  . amLODipine  (NORVASC) tablet 5 mg  5 mg Oral Daily Simonne Maffucci B, MD   5 mg at 08/18/18 1015  . aspirin chewable tablet 81 mg  81 mg Oral Daily Simonne Maffucci B, MD   81 mg at 08/18/18 1015  . atorvastatin (LIPITOR) tablet 40 mg  40 mg Oral QHS Simonne Maffucci B, MD   40 mg at 08/17/18 2203  . carvedilol (COREG) tablet 6.25 mg  6.25 mg Oral BID WC Lavina Hamman, MD      . enoxaparin (LOVENOX) injection 40 mg  40 mg Subcutaneous Q24H Simonne Maffucci B, MD   40 mg at 08/17/18 1308  . haloperidol (HALDOL) tablet 0.5 mg  0.5 mg Per Tube BID Simonne Maffucci B, MD   0.5 mg at 08/18/18 1015  . HYDROcodone-acetaminophen (NORCO/VICODIN) 5-325 MG per tablet 1 tablet  1 tablet Oral Q6H PRN Harvel Quale, [facility]   1 tablet at 08/17/18 1427  . insulin aspart (novoLOG) injection 0-15 Units  0-15 Units Subcutaneous TID WC Juanito Doom, MD   3 Units at 08/18/18 [phone]  . insulin aspart (novoLOG) injection 0-5 Units  0-5 Units Subcutaneous QHS Juanito Doom, MD   3 Units at 08/17/18 2203  . ipratropium-albuterol (DUONEB) 0.5-2.5 (3) MG/3ML nebulizer solution 3 mL  3 mL Nebulization Q6H PRN Simonne Maffucci B, MD      . levETIRAcetam (KEPPRA) tablet 500 mg  500 mg Oral BID Simonne Maffucci B, MD   500 mg at 08/18/18 1015  . levothyroxine (SYNTHROID, LEVOTHROID) tablet 25 mcg  25 mcg Oral Q0600 Juanito Doom, MD   25 mcg at 08/18/18 8638  . MEDLINE mouth rinse  15 mL Mouth Rinse BID Simonne Maffucci B, MD   15 mL at 08/18/18 1016  . spironolactone (ALDACTONE) tablet 25 mg  25 mg Oral Daily Simonne Maffucci B, MD   25 mg at 08/18/18 1015  . torsemide (DEMADEX) tablet 40 mg  40 mg Oral Q48H McQuaid, Douglas B, MD   40 mg at 08/18/18 1016   And  . torsemide (DEMADEX) tablet 60 mg  60 mg Oral Q48H Juanito Doom, MD   60 mg at 08/17/18 0935     Discharge Medications: Please see discharge summary for a list of discharge medications.  Relevant Imaging Results:  Relevant Lab  Results:   Additional Information SS#[id number]  Deziya Amero A Aricela Bertagnolli, LCSW

## 2018-08-18 NOTE — Clinical Social Work Note (Signed)
Pt is from [facility]. Pt will return at d/c.   [place], [place]

## 2018-08-18 NOTE — Care Management Important Message (Signed)
Important Message  Patient Details  Name: [name] MRN: [id number] Date of Birth: Sep 26, 1950   Medicare Important Message Given:  Yes    [name] Montine Circle 08/18/2018, 2:55 PM

## 2018-08-18 NOTE — Progress Notes (Signed)
Patient is being discharged no longer needing PIV

## 2018-08-18 NOTE — Progress Notes (Signed)
  Speech Language Pathology Treatment: Dysphagia  Patient Details Name: [name] MRN: [id number] DOB: [birth year] Today's Date: 08/18/2018 Time: 1217-1228 SLP Time Calculation (min) (ACUTE ONLY): 11 min  Assessment / Plan / Recommendation Clinical Impression  Pt has no minimal recollection of MBS on previous date, therefore education was provided about results and recommendations. Emphasis was placed on need for chin tuck with thin liquids to reduce the risk of aspiration. Pt consumed thin liquids with Min-Mod cues from SLP for timing. SLP provided new signage with diet recommendations since pt had transferred rooms, and written cue was written in front of pt to help remind her to tuck her chin. Will continue to follow - she would benefit from SLP f/u post-discharge to further facilitate use of swallowing strategies.   HPI HPI: Pt is a 68 year old lady admitted with AMS and suspected seizure, intubated 1/1 for <24 hours. Pt was recently admitted (December 2019) with similar presentation, with SLP clinical evaluation initially recommending NPO but advanced to Dys 3, thin liquids prior to d/c. Pt says she gets "choked" on liquids "all the time" and believes she has had a swallow study before but there is no documentation of this in her chart. PMH includes: schizophrenia, DM, RA, PTSD, PE, OSA, memory loss, GERD, COPD, CKD, CHF, barrett's esophagus, asthma, anxiety      SLP Plan  Continue with current plan of care       Recommendations  Diet recommendations: Dysphagia 3 (mechanical soft);Thin liquid Liquids provided via: Cup;Straw Medication Administration: Whole meds with puree Supervision: Patient able to self feed;Full supervision/cueing for compensatory strategies Compensations: Minimize environmental distractions;Slow rate;Small sips/bites;Chin tuck Postural Changes and/or Swallow Maneuvers: Seated upright 90 degrees;Upright 30-60 min after meal                Oral Care  Recommendations: Oral care BID Follow up Recommendations: Home health SLP SLP Visit Diagnosis: Dysphagia, oropharyngeal phase (R13.12) Plan: Continue with current plan of care       GO                [name] 08/18/2018, 1:46 PM  [name], M.A. [place] Acute Environmental education officer [phone] Office [phone]

## 2018-08-18 NOTE — Progress Notes (Signed)
Telemetry notified RN, pt had an 11 beat run of Vtach.  Paged on-call physician.  Pt is asymptomatic and complains of no pain of shortness of breath.  Will continue patient current plan of care.

## 2018-08-18 NOTE — Progress Notes (Signed)
Report called to receiving RN [phone]) Daisy. Pt VSS. D/C packet given to PTAR. All questions answered.

## 2018-08-19 ENCOUNTER — Inpatient Hospital Stay (HOSPITAL_COMMUNITY): Payer: Medicare Other

## 2018-08-19 LAB — COMPREHENSIVE METABOLIC PANEL
ALT: 25 U/L (ref 0–44)
AST: 22 U/L (ref 15–41)
Albumin: 2.6 g/dL — ABNORMAL LOW (ref 3.5–5.0)
Alkaline Phosphatase: 71 U/L (ref 38–126)
Anion gap: 7 (ref 5–15)
BUN: 15 mg/dL (ref 8–23)
CO2: 26 mmol/L (ref 22–32)
Calcium: 9.2 mg/dL (ref 8.9–10.3)
Chloride: 109 mmol/L (ref 98–111)
Creatinine, Ser: 1.94 mg/dL — ABNORMAL HIGH (ref 0.44–1.00)
GFR calc Af Amer: 31 mL/min — ABNORMAL LOW (ref >60)
GFR calc non Af Amer: 26 mL/min — ABNORMAL LOW (ref >60)
Glucose, Bld: 224 mg/dL — ABNORMAL HIGH (ref 70–99)
Potassium: 3.9 mmol/L (ref 3.5–5.1)
Sodium: 142 mmol/L (ref 135–145)
Total Bilirubin: 0.5 mg/dL (ref 0.3–1.2)
Total Protein: 6.1 g/dL — ABNORMAL LOW (ref 6.5–8.1)

## 2018-08-19 LAB — CBC WITH DIFFERENTIAL/PLATELET
Abs Immature Granulocytes: 0.03 10*3/uL (ref 0.00–0.07)
Basophils Absolute: 0 10*3/uL (ref 0.0–0.1)
Basophils Relative: 1 %
Eosinophils Absolute: 0.3 10*3/uL (ref 0.0–0.5)
Eosinophils Relative: 4 %
HCT: 27.1 % — ABNORMAL LOW (ref 36.0–46.0)
Hemoglobin: 9.4 g/dL — ABNORMAL LOW (ref 12.0–15.0)
Immature Granulocytes: 0 %
Lymphocytes Relative: 15 %
Lymphs Abs: 1 10*3/uL (ref 0.7–4.0)
MCH: 32.8 pg (ref 26.0–34.0)
MCHC: 34.7 g/dL (ref 30.0–36.0)
MCV: 94.4 fL (ref 80.0–100.0)
Monocytes Absolute: 0.6 10*3/uL (ref 0.1–1.0)
Monocytes Relative: 8 %
Neutro Abs: 5.1 10*3/uL (ref 1.7–7.7)
Neutrophils Relative %: 72 %
Platelets: 196 10*3/uL (ref 150–400)
RBC: 2.87 MIL/uL — ABNORMAL LOW (ref 3.87–5.11)
RDW: 13.4 % (ref 11.5–15.5)
WBC: 7.1 10*3/uL (ref 4.0–10.5)
nRBC: 0 % (ref 0.0–0.2)

## 2018-08-19 LAB — GLUCOSE, CAPILLARY
Glucose-Capillary: 216 mg/dL — ABNORMAL HIGH (ref 70–99)
Glucose-Capillary: 199 mg/dL — ABNORMAL HIGH (ref 70–99)
Glucose-Capillary: 253 mg/dL — ABNORMAL HIGH (ref 70–99)
Glucose-Capillary: 247 mg/dL — ABNORMAL HIGH (ref 70–99)

## 2018-08-19 LAB — MAGNESIUM: Magnesium: 2 mg/dL (ref 1.7–2.4)

## 2018-08-19 MED ORDER — LORAZEPAM 2 MG/ML IJ SOLN
1.0000 mg | Freq: Once | INTRAMUSCULAR | Status: AC
  Administered 2018-08-19: 1 mg via INTRAVENOUS
  Filled 2018-08-19: qty 1

## 2018-08-19 NOTE — Progress Notes (Signed)
Notified RN that pt needs an order for home O2.

## 2018-08-19 NOTE — Progress Notes (Signed)
SATURATION QUALIFICATIONS: (This note is used to comply with regulatory documentation for home oxygen)  Patient Saturations on Room Air at Rest = 87%  Patient Saturations on Room Air while Ambulating = 97%  Patient Saturations on 2 Liters of oxygen while Ambulating = 100%  Please briefly explain why patient needs home oxygen: Pt requires home oxygen to keep saturation level above 92%

## 2018-08-19 NOTE — Discharge Summary (Signed)
[facility] Discharge Summary   Patient: [name] MWN:[id number]   PCP: Glenda Chroman, MD DOB: 03/07/1951   Date of admission: 08/16/2018   Date of discharge:  08/19/2018    Discharge Diagnoses:  Acute unresponsive event secondary to seizure. Acute hypoxic respiratory failure Hypothyroidism Schizophrenia Depression Chronic kidney disease stage IV Type 2 diabetes mellitus Chronic diastolic CHF  Admitted From: ALF Disposition: ALF with home health  Recommendations for Outpatient Follow-up:  1. Please follow-up with PCP in 1 week. 2. Please follow-up with neurology as recommended.   Contact information for follow-up providers    [facility] Follow up.   Specialty:  Home Health Services Why:  HHPT, HHOT Contact information: [place] [address] [phone]        Glenda Chroman, MD. Schedule an appointment as soon as possible for a visit in 1 week(s).   Specialty:  Internal Medicine Contact information: [address] [place] [address] [phone]        [facility]. Schedule an appointment as soon as possible for a visit in 1 month(s).   Specialty:  Neurology Contact information: [address] [place] [phone]           Contact information for after-discharge care    Destination    [facility] .   Service:  Assisted Living Contact information: [address] [place] [phone]                 Diet recommendation: full code  Activity: The patient is advised to gradually reintroduce usual activities.  Discharge Condition: good  Code Status: full code  History of present illness: As per the H and P dictated on admission, "[name] is a 68 year old lady with history of schizophrenia hypothyroidism diabetes mellitus type 2 chronic diastolic congestive heart failure presented with altered  mental status agitation to [facility] during her stay she started screaming complaining of " seeing monsters" and then she became unresponsive with deviation of her eyes and had to be intubated for airway protection and desaturation there was concern of possible seizure and was transferred to our hospital for further management.  Patient came in to our hospital sedated on propofol no visible seizures and obviously not able to get any history from the patient since her sedation and no family is available.  CT scan of the head did not show any acute event.  Patient had similar presentation mid of December where she was intubated there was no signs of seizures on her EEG with negative CT scan of her head and was concerned about polypharmacy causing her acute encephalopathy or catatonia."  Hospital Course:  Summary of her active problems in the hospital is as following. Acute hypoxic respiratory failure. Requiring mechanical ventilation for airway protection. Suspected seizures. Acute metabolic encephalopathy secondary to seizures. Initially admitted at any pain hospital, was intubated there for airway protection. Admitted to ICU. Patient was started on propofol. EEG was performed which did not show any evidence of active seizures. PCCM discussed with neurology who recommends keeping her on Keppra > will need outpatient Neuro consult Patient had an extensive work-up including an MRI, EEG, CT scan during prior admission as well as this admission which were unremarkable. Currently appears to be back to baseline.  Acute hypoxic respiratory failure:Intubated on admission- successfully extubated Need oxygen on discharge.   Hypertension: Relatively stable-continue Coreg, Demadex and Aldactone  on discharge and amlodipine as well.    Stage IV CKD: Creatinine close to usual baseline. Follow.  Chronic diastolic heart failure: Reasonably well compensated-continue Coreg and  Demadex.  Dyslipidemia:Continue statin  Uncontrolled type 2 diabetes mellitus, with renal dysfunction complication.   Continue home regimen for now. Further adjustment per PCP.  Hypothyroidism:Continue Synthroid  Schizophrenia:Apparently has a longstanding history of schizophrenia since she was 68 years old.  patient will need to follow-up with her primary psychiatrist in 1 week for further care.  Dysphagia:Initially kept n.p.o. postextubation-but upon follow-up by speech therapy-has been started on a dysphagia 3 diet. Per nursing staff she has been diet tolerating well.   Pulmonary hypertension > needs outpatient sleep study > resume aldactone and torsemide  Was on cephalexin and oral vancomycin prior to admission with no documentation anywhere why she was on these, will continue to hold and monitor for infection/diarrhea.  All other chronic medical condition were stable during the hospitalization.  Patient was seen by physical therapy, who recommended home health, which was arranged by Education officer, museum and case Freight forwarder. On the day of the discharge the patient's vitals were stable , and no other acute medical condition were reported by patient. the patient was felt safe to be discharge at ALF with therapy.  Consultants: PCCM  Procedures: EEG  DISCHARGE MEDICATION: Allergies as of 08/19/2018      Reactions   Benztropine Mesylate Other (See Comments)   Alopecia and white spots on eyes   Codeine Swelling, Palpitations, Other (See Comments)   Mouth Swelling and Tachycardia   Diazepam Other (See Comments)   COMA   Diphenhydramine Hcl Anxiety, Palpitations, Other (See Comments)   Tachycardia and anxiousness   Penicillins Other (See Comments)   REACTION: ulcers in mouth, swelling, throat swelling  Has patient had a PCN reaction causing immediate rash, facial/tongue/throat swelling, SOB or lightheadedness with hypotension: YES Has patient had a PCN reaction causing severe rash  involving Mucus membranes or skin necrosis: Unknown Has patient had a PCN reaction that required hospitalization Unknown Has patient had a PCN reaction occurring within the last 10 years: unknown If all of the above answers are "NO", then may proceed with Cephalospori   Sulfonamide Derivatives Rash   Blisters, also   Thioridazine Hcl Swelling   Benztropine Other (See Comments)   "Alopecia and white spots on eyes"   Penicillin G Swelling, Other (See Comments)   Ulcers in mouth & tongue became swollen Has patient had a PCN reaction causing immediate rash, facial/tongue/throat swelling, SOB or lightheadedness with hypotension: Yes Has patient had a PCN reaction causing severe rash involving mucus membranes or skin necrosis: Unk Has patient had a PCN reaction that required hospitalization: Unk Has patient had a PCN reaction occurring within the last 10 years: Unk If all of the above answers are "NO", then may proceed with Cephalosporin use.   Lisinopril Cough   Sulfamethoxazole Rash      Medication List    STOP taking these medications   cephALEXin 500 MG capsule Commonly known as:  KEFLEX   vancomycin 250 MG capsule Commonly known as:  VANCOCIN     TAKE these medications   acetaminophen 325 MG tablet Commonly known as:  TYLENOL Take 325 mg by mouth 2 (two) times daily.   albuterol (2.5 MG/3ML) 0.083% nebulizer solution Commonly known as:  PROVENTIL Take 2.5 mg by nebulization every 6 (six) hours as needed for wheezing.   VENTOLIN HFA 108 (90 Base) MCG/ACT inhaler Generic drug:  albuterol Inhale 1-2 puffs into the lungs every 6 (six) hours as needed for wheezing or shortness of breath.   amLODipine 5 MG tablet Commonly known as:  NORVASC Take 1 tablet (5 mg total) by mouth daily.   aspirin 81 MG tablet Take 81 mg by mouth daily.   atorvastatin 40 MG tablet Commonly known as:  LIPITOR Take 40 mg by mouth at bedtime.   carvedilol 6.25 MG tablet Commonly known as:   COREG Take 1 tablet (6.25 mg total) by mouth 2 (two) times daily with a meal.   DAILY VITE PO Take 1 tablet by mouth daily.   docusate sodium 100 MG capsule Commonly known as:  COLACE Take 100 mg by mouth daily.   EPINEPHrine 0.3 mg/0.3 mL Devi Commonly known as:  EPI-PEN Inject 0.3 mg into the muscle once as needed (for bee stings, then go to the ED immediately after using).   FLEET LIQUID GLYCERIN SUPP RE Place 1 suppository rectally daily as needed (for constipation).   glipiZIDE 5 MG 24 hr tablet Commonly known as:  GLUCOTROL XL Take 5 mg by mouth 2 (two) times daily.   haloperidol 0.5 MG tablet Commonly known as:  HALDOL Take 1 tablet (0.5 mg total) by mouth 2 (two) times daily.   hydrocortisone 2.5 % ointment Apply 1 application topically daily as needed (itchy skin).   levETIRAcetam 500 MG tablet Commonly known as:  KEPPRA Take 1 tablet (500 mg total) by mouth 2 (two) times daily.   levothyroxine 25 MCG tablet Commonly known as:  SYNTHROID, LEVOTHROID Take 1 tablet (25 mcg total) by mouth daily at 6 (six) AM.   linagliptin 5 MG Tabs tablet Commonly known as:  TRADJENTA Take 5 mg by mouth daily.   loperamide 2 MG capsule Commonly known as:  IMODIUM Take 2 mg by mouth 3 (three) times daily as needed (for diarrhea).   montelukast 10 MG tablet Commonly known as:  SINGULAIR Take 10 mg by mouth at bedtime.   omeprazole 20 MG capsule Commonly known as:  PRILOSEC Take 20 mg by mouth daily.   OVER THE COUNTER MEDICATION CPAP   polyethylene glycol powder powder Commonly known as:  GLYCOLAX/MIRALAX Take 17 g by mouth daily as needed (for constipation).   pyridOXINE 100 MG tablet Commonly known as:  VITAMIN B-6 Take 200 mg by mouth 2 (two) times daily.   spironolactone 25 MG tablet Commonly known as:  ALDACTONE Take 1 tablet (25 mg total) by mouth daily.   torsemide 20 MG tablet Commonly known as:  DEMADEX Take 60 mg alternating with 40 mg daily. What  changed:    how much to take  how to take this  when to take this  additional instructions   TRESIBA FLEXTOUCH 100 UNIT/ML Sopn FlexTouch Pen Generic drug:  insulin degludec Inject 20 Units into the skin at bedtime.   Vitamin D3 50 MCG (2000 UT) Tabs Take 4,000 Units by mouth daily.      Allergies  Allergen Reactions  . Benztropine Mesylate Other (See Comments)    Alopecia and white spots on eyes  . Codeine Swelling, Palpitations and Other (See Comments)    Mouth Swelling and Tachycardia   . Diazepam Other (See Comments)    COMA  . Diphenhydramine Hcl Anxiety, Palpitations and Other (See Comments)    Tachycardia and anxiousness  . Penicillins Other (See Comments)    REACTION: ulcers in mouth, swelling, throat swelling  Has patient had a PCN reaction causing immediate  rash, facial/tongue/throat swelling, SOB or lightheadedness with hypotension: YES Has patient had a PCN reaction causing severe rash involving Mucus membranes or skin necrosis: Unknown Has patient had a PCN reaction that required hospitalization Unknown Has patient had a PCN reaction occurring within the last 10 years: unknown If all of the above answers are "NO", then may proceed with Cephalospori  . Sulfonamide Derivatives Rash    Blisters, also  . Thioridazine Hcl Swelling  . Benztropine Other (See Comments)    "Alopecia and white spots on eyes"  . Penicillin G Swelling and Other (See Comments)    Ulcers in mouth & tongue became swollen Has patient had a PCN reaction causing immediate rash, facial/tongue/throat swelling, SOB or lightheadedness with hypotension: Yes Has patient had a PCN reaction causing severe rash involving mucus membranes or skin necrosis: Unk Has patient had a PCN reaction that required hospitalization: Unk Has patient had a PCN reaction occurring within the last 10 years: Unk If all of the above answers are "NO", then may proceed with Cephalosporin use.   [name] Kitchen Lisinopril Cough  .  Sulfamethoxazole Rash   Discharge Instructions    Ambulatory referral to Neurology   Complete by:  As directed    An appointment is requested in approximately: 4 weeks   DIET DYS 3   Complete by:  As directed    Fluid consistency:  Thin   Discharge instructions   Complete by:  As directed    It is important that you read following instructions as well as go over your medication list with RN to help you understand your care after this hospitalization.  Discharge Instructions: Please follow-up with PCP in one week  Please request your primary care physician to go over all Hospital Tests and Procedure/Radiological results at the follow up,  Please get all Hospital records sent to your PCP by signing hospital release before you go home.   Do not drive, operating heavy machinery, perform activities at heights, swimming or participation in water activities or provide baby sitting services; until you have been seen by Primary Care Physician or a Neurologist and advised to do so again. Do not take more than prescribed Pain, Sleep and Anxiety Medications. You were cared for by a hospitalist during your hospital stay. If you have any questions about your discharge medications or the care you received while you were in the hospital after you are discharged, you can call the unit you were admitted to and ask to speak with the hospitalist on call if the hospitalist that took care of you is not available.  Once you are discharged, your primary care physician will handle any further medical issues. Please note that NO REFILLS for any discharge medications will be authorized once you are discharged, as it is imperative that you return to your primary care physician (or establish a relationship with a primary care physician if you do not have one) for your aftercare needs so that they can reassess your need for medications and monitor your lab values. You Must read complete instructions/literature along with  all the possible adverse reactions/side effects for all the Medicines you take and that have been prescribed to you. Take any new Medicines after you have completely understood and accept all the possible adverse reactions/side effects. Wear Seat belts while driving. If you have smoked or chewed Tobacco in the last 2 yrs please stop smoking and/or stop any Recreational drug use.   Increase activity slowly   Complete by:  As  directed      Discharge Exam: Filed Weights   08/16/18 0600 08/16/18 2000 08/17/18 0415  Weight: 100.9 kg 100.9 kg 101.9 kg   Vitals:   08/18/18 2340 08/19/18 0807  BP: (!) 172/71 (!) 156/80  Pulse: 83 84  Resp: 16 15  Temp: 98.3 F (36.8 C) 99.5 F (37.5 C)  SpO2: 98%    General: Appear in no distress, no Rash; Oral Mucosa moist. Cardiovascular: S1 and S2 Present, no Murmur, no JVD Respiratory: Bilateral Air entry present and Clear to Auscultation, no Crackles, no wheezes Abdomen: Bowel Sound present, Soft and no tenderness Extremities: no Pedal edema, no calf tenderness Neurology: Grossly no focal neuro deficit.  The results of significant diagnostics from this hospitalization (including imaging, microbiology, ancillary and laboratory) are listed below for reference.    Significant Diagnostic Studies: Ct Head Wo Contrast  Result Date: 08/16/2018 CLINICAL DATA:  Acute onset of altered mental status. Patient unresponsive. EXAM: CT HEAD WITHOUT CONTRAST TECHNIQUE: Contiguous axial images were obtained from the base of the skull through the vertex without intravenous contrast. COMPARISON:  CT of the head performed 08/11/2018 FINDINGS: Brain: No evidence of acute infarction, hemorrhage, hydrocephalus, extra-axial collection or mass lesion/mass effect. Mild periventricular white matter change likely reflects small vessel ischemic microangiopathy. A chronic lacunar infarct is noted at the left basal ganglia. The posterior fossa, including the cerebellum, brainstem and  fourth ventricle, is within normal limits. The third and lateral ventricles are unremarkable in appearance. The cerebral hemispheres are symmetric in appearance, with normal gray-white differentiation. No mass effect or midline shift is seen. Vascular: No hyperdense vessel or unexpected calcification. Skull: There is no evidence of fracture; visualized osseous structures are unremarkable in appearance. Sinuses/Orbits: The orbits are within normal limits. Mucosal thickening is noted at the maxillary sinuses bilaterally, with a likely mucus retention cyst or polyp at the left maxillary sinus. The remaining paranasal sinuses and mastoid air cells are well-aerated. Other: No significant soft tissue abnormalities are seen. IMPRESSION: 1. No acute intracranial pathology seen on CT. 2. Mild small vessel ischemic microangiopathy. 3. 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 4. Mucosal thickening at the maxillary sinuses bilaterally, with a likely mucus retention cyst or polyp at the left maxillary sinus. Electronically Signed   By: Garald Balding M.D.   On: 08/16/2018 02:23   Mr Jodene Nam Head Wo Contrast  Result Date: 07/27/2018 CLINICAL DATA:  Acute onset somnolence.  Altered mental status. EXAM: MRI HEAD WITHOUT CONTRAST MRA HEAD WITHOUT CONTRAST TECHNIQUE: Multiplanar, multiecho pulse sequences of the brain and surrounding structures were obtained without intravenous contrast. Angiographic images of the head were obtained using MRA technique without contrast. COMPARISON:  Head CT 07/27/2018 Brain MRI 04/19/2018 FINDINGS: An abbreviated protocol was performed. Sagittal T1, axial T2, axial T1 and coronal T2-weighted imaging was not obtained. MRI HEAD FINDINGS BRAIN: There is no acute infarct, acute hemorrhage or mass effect. The midline structures are normal. There are no old infarcts. Multifocal white matter hyperintensity, most commonly due to chronic ischemic microangiopathy. Mild generalized atrophy. SKULL AND  UPPER CERVICAL SPINE: The visualized skull base, calvarium, upper cervical spine and extracranial soft tissues are normal. SINUSES/ORBITS: No fluid levels or advanced mucosal thickening. No mastoid or middle ear effusion. The orbits are normal. MRA HEAD FINDINGS Intracranial internal carotid arteries: Normal. Anterior cerebral arteries: Normal. Middle cerebral arteries: Normal. Posterior communicating arteries: Present on the right only. Posterior cerebral arteries: Normal. Basilar artery: Normal. Vertebral arteries: Left dominant. Normal. Superior cerebellar  arteries: Normal. Inferior cerebellar arteries: Normal. IMPRESSION: 1. No acute intracranial abnormality. 2. Findings of chronic microvascular ischemia. 3. Normal intracranial MRA. Electronically Signed   By: Ulyses Jarred M.D.   On: 07/27/2018 19:56   Mr Brain Wo Contrast  Result Date: 07/27/2018 CLINICAL DATA:  Acute onset somnolence.  Altered mental status. EXAM: MRI HEAD WITHOUT CONTRAST MRA HEAD WITHOUT CONTRAST TECHNIQUE: Multiplanar, multiecho pulse sequences of the brain and surrounding structures were obtained without intravenous contrast. Angiographic images of the head were obtained using MRA technique without contrast. COMPARISON:  Head CT 07/27/2018 Brain MRI 04/19/2018 FINDINGS: An abbreviated protocol was performed. Sagittal T1, axial T2, axial T1 and coronal T2-weighted imaging was not obtained. MRI HEAD FINDINGS BRAIN: There is no acute infarct, acute hemorrhage or mass effect. The midline structures are normal. There are no old infarcts. Multifocal white matter hyperintensity, most commonly due to chronic ischemic microangiopathy. Mild generalized atrophy. SKULL AND UPPER CERVICAL SPINE: The visualized skull base, calvarium, upper cervical spine and extracranial soft tissues are normal. SINUSES/ORBITS: No fluid levels or advanced mucosal thickening. No mastoid or middle ear effusion. The orbits are normal. MRA HEAD FINDINGS Intracranial  internal carotid arteries: Normal. Anterior cerebral arteries: Normal. Middle cerebral arteries: Normal. Posterior communicating arteries: Present on the right only. Posterior cerebral arteries: Normal. Basilar artery: Normal. Vertebral arteries: Left dominant. Normal. Superior cerebellar arteries: Normal. Inferior cerebellar arteries: Normal. IMPRESSION: 1. No acute intracranial abnormality. 2. Findings of chronic microvascular ischemia. 3. Normal intracranial MRA. Electronically Signed   By: Ulyses Jarred M.D.   On: 07/27/2018 19:56   US Venous Img Lower Bilateral  Result Date: 08/15/2018 CLINICAL DATA:  Bilateral lower extremity pain and edema since this morning. Evaluate for DVT. EXAM: BILATERAL LOWER EXTREMITY VENOUS DOPPLER ULTRASOUND TECHNIQUE: Gray-scale sonography with graded compression, as well as color Doppler and duplex ultrasound were performed to evaluate the lower extremity deep venous systems from the level of the common femoral vein and including the common femoral, femoral, profunda femoral, popliteal and calf veins including the posterior tibial, peroneal and gastrocnemius veins when visible. The superficial great saphenous vein was also interrogated. Spectral Doppler was utilized to evaluate flow at rest and with distal augmentation maneuvers in the common femoral, femoral and popliteal veins. COMPARISON:  None. FINDINGS: RIGHT LOWER EXTREMITY Common Femoral Vein: No evidence of thrombus. Normal compressibility, respiratory phasicity and response to augmentation. Saphenofemoral Junction: No evidence of thrombus. Normal compressibility and flow on color Doppler imaging. Profunda Femoral Vein: No evidence of thrombus. Normal compressibility and flow on color Doppler imaging. Femoral Vein: No evidence of thrombus. Normal compressibility, respiratory phasicity and response to augmentation. Popliteal Vein: No evidence of thrombus. Normal compressibility, respiratory phasicity and response to  augmentation. Calf Veins: No evidence of thrombus. Normal compressibility and flow on color Doppler imaging. Superficial Great Saphenous Vein: No evidence of thrombus. Normal compressibility. Venous Reflux:  None. Other Findings:  None. LEFT LOWER EXTREMITY Common Femoral Vein: No evidence of thrombus. Normal compressibility, respiratory phasicity and response to augmentation. Saphenofemoral Junction: No evidence of thrombus. Normal compressibility and flow on color Doppler imaging. Profunda Femoral Vein: No evidence of thrombus. Normal compressibility and flow on color Doppler imaging. Femoral Vein: No evidence of thrombus. Normal compressibility, respiratory phasicity and response to augmentation. Popliteal Vein: No evidence of thrombus. Normal compressibility, respiratory phasicity and response to augmentation. Calf Veins: No evidence of thrombus. Normal compressibility and flow on color Doppler imaging. Superficial Great Saphenous Vein: No evidence of thrombus. Normal compressibility. Venous Reflux:  None. Other Findings:  None. IMPRESSION: No evidence of DVT within either lower extremity. Electronically Signed   By: Sandi Mariscal M.D.   On: 08/15/2018 17:09   Dg Chest Port 1 View  Result Date: 08/16/2018 CLINICAL DATA:  Intubation EXAM: PORTABLE CHEST 1 VIEW COMPARISON:  08/16/2018 at 1:43 a.m. FINDINGS: Endotracheal tube tip is just below the level of the clavicles, approximately 1.3 cm above the inferior margin of the carina. The enteric tube tip is beyond the field of view. The side port is not clearly visualized. There is shallow lung inflation with bibasilar atelectasis. Unchanged cardiomegaly. IMPRESSION: Endotracheal tube tip just below the level of the clavicles, approximately 1.3 cm above the inferior margin of the carina. Enteric tube tip beyond the field of view. Electronically Signed   By: Ulyses Jarred M.D.   On: 08/16/2018 06:22   Dg Chest Port 1 View  Result Date: 08/16/2018 CLINICAL DATA:   Acute onset of altered mental status. Endotracheal tube placement. EXAM: PORTABLE CHEST 1 VIEW COMPARISON:  Chest radiograph performed 08/15/2018 FINDINGS: The patient's endotracheal tube is seen ending 1.5 cm above the carina. This could be retracted 2 cm. The patient's enteric tube is seen ending overlying the body of the stomach, with the side port about the gastroesophageal junction. This could be advanced 3 cm. The lungs are mildly hypoexpanded. Vascular congestion and vascular crowding are noted. No pleural effusion or pneumothorax is seen The cardiomediastinal silhouette is mildly enlarged. No acute osseous abnormalities are identified. IMPRESSION: 1. Endotracheal tube seen ending 1.5 cm above the carina. This could be retracted 2 cm. 2. Enteric tube noted ending overlying the body of the stomach, with the side port about the gastroesophageal junction. This could be advanced 3 cm. 3. Lungs mildly hypoexpanded but grossly clear. Vascular congestion and mild cardiomegaly noted. Electronically Signed   By: Garald Balding M.D.   On: 08/16/2018 02:03   Dg Chest Port 1 View  Result Date: 08/15/2018 CLINICAL DATA:  68 year old female with chest pain and weakness. EXAM: PORTABLE CHEST 1 VIEW COMPARISON:  [facility] 08/11/2018 and earlier. FINDINGS: Portable AP upright view at 1453 hours. Low but improved lung volumes. Normal cardiac size and mediastinal contours. Visualized tracheal air column is within normal limits. Allowing for portable technique the lungs are clear. No pneumothorax. Paucity of bowel gas in the upper abdomen. Visible osseous structures appear intact. IMPRESSION: No acute cardiopulmonary abnormality. Electronically Signed   By: Genevie Ann M.D.   On: 08/15/2018 15:07   Dg Chest Port 1 View  Result Date: 07/28/2018 CLINICAL DATA:  Hypoxia EXAM: PORTABLE CHEST 1 VIEW COMPARISON:  July 27, 2018 FINDINGS: Endotracheal tube tip is 2.2 cm above the carina. Nasogastric tube tip and  side port are below the diaphragm. No pneumothorax. There is atelectatic change in the lung bases. The lungs elsewhere are clear. Heart is mildly enlarged with pulmonary vascularity normal. No adenopathy. No appreciable bone lesions. IMPRESSION: Tube positions as described without pneumothorax. Bibasilar atelectasis. No frank airspace consolidation. Stable cardiac prominence. Electronically Signed   By: Lowella Grip III M.D.   On: 07/28/2018 07:22   Dg Chest Portable 1 View  Result Date: 07/27/2018 CLINICAL DATA:  Tube placement. EXAM: PORTABLE CHEST 1 VIEW COMPARISON:  07/14/2018. FINDINGS: Endotracheal tube tip noted approximately 7 mm above the lower portion of the carina. Proximal repositioning of approximately 2 cm should be considered. NG tube noted with tip below left hemidiaphragm. Cardiomegaly with pulmonary venous congestion bilateral interstitial prominence.  Findings consistent with CHF. No pneumothorax. IMPRESSION: 1. Endotracheal tube noted with its tip approximately 7 mm above the lower portion of the carina. Proximal repositioning of approximately 2 cm should be considered. NG tube noted with tip below left hemidiaphragm. 2. Cardiomegaly with pulmonary venous congestion bilateral interstitial prominence consistent with CHF. Electronically Signed   By: Marcello Moores  Register   On: 07/27/2018 16:23   Dg Abd Portable 1v  Result Date: 08/19/2018 CLINICAL DATA:  68 year old female with abdominal pain EXAM: PORTABLE ABDOMEN - 1 VIEW COMPARISON:  Prior CT scan of the abdomen and pelvis 06/01/2018; prior modified barium swallow 08/17/2018 FINDINGS: A total of 3 radiographs were required to encompass the entirety of the abdomen. All images are supine. Incompletely imaged cardiomegaly. The lung bases are grossly clear. No significant bowel dilatation. Previously ingested oral contrast material opacifies the colon. Unremarkable colonic stool burden. There are a few small diverticula in both the ascending  and descending colon. IMPRESSION: No evidence of bowel obstruction. Previously ingested contrast material opacifies the colon. Electronically Signed   By: Jacqulynn Cadet M.D.   On: 08/19/2018 09:01   Dg Swallowing Func-speech Pathology  Result Date: 08/17/2018 Objective Swallowing Evaluation: Type of Study: MBS-Modified Barium Swallow Study  Patient Details Name: Renate Danh MRN: [id number] Date of Birth: [birth year] Today's Date: 08/17/2018 Time: SLP Start Time (ACUTE ONLY): 1007 -SLP Stop Time (ACUTE ONLY): 1023 SLP Time Calculation (min) (ACUTE ONLY): 16 min Past Medical History: Past Medical History: Diagnosis Date . Anxiety  . Asthma  . Back pain  . Barrett esophagus  . CHF (congestive heart failure) ([place])  . CKD (chronic kidney disease) stage 3, GFR 30-59 ml/min (HCC)  . COPD (chronic obstructive pulmonary disease) ([place])  . Depression  . Diastolic heart failure ([place])  . Essential hypertension  . Fibromyalgia  . GERD (gastroesophageal reflux disease)  . Gout  . History of pericarditis  . Hyperlipidemia  . Hypothyroid  . Major depressive disorder  . Memory loss  . Nephrolithiasis   2008 . Obesity hypoventilation syndrome (HCC)   CPAP . Obstructive sleep apnea  . PE (pulmonary embolism)   2010 . Peripheral neuropathy  . PTSD (post-traumatic stress disorder)  . Rheumatoid arthritis ([place])  . Schizophrenia ([place])  . Steroid dependence  . Type 2 diabetes mellitus ([place])  . Vitamin D deficiency  Past Surgical History: Past Surgical History: Procedure Laterality Date . APPENDECTOMY   . CHOLECYSTECTOMY   . COLONOSCOPY    benign polyps (12/2000), repeat colonoscopy performed in 2007 . ENDOMETRIAL BIOPSY    10/03 benign columnar mucosa (limited material) . KIDNEY SURGERY   HPI: Pt is a 68 year old lady admitted with AMS and suspected seizure, intubated 1/1 for <24 hours. Pt was recently admitted (December 2019) with similar presentation, with SLP clinical evaluation initially recommending NPO but advanced to Dys 3, thin  liquids prior to d/c. Pt says she gets "choked" on liquids "all the time" and believes she has had a swallow study before but there is no documentation of this in her chart. PMH includes: schizophrenia, DM, RA, PTSD, PE, OSA, memory loss, GERD, COPD, CKD, CHF, barrett's esophagus, asthma, anxiety  Subjective: pt says she coughs with liquids "a lot" and that it has been going on for "a long time" but cannot specify further Assessment / Plan / Recommendation CHL IP CLINICAL IMPRESSIONS 08/17/2018 Clinical Impression Pt has a mild oropharyngeal dysphagia due to impaired timing of airway closure, resulting in aspiration during the swallow with thin liquids. Pt also  has mild lingual residue after the swallow with all consistencies, although with oral residuals spilling posteriorly into the pharynx with thin liquids. This did not result in any further aspiration during the study, but positioning in bed and impulsivity could allow for aspiration after the swallow during meals, particularly as pt does not spontaneously manage her residuals. When pt does aspirate during the swallow it is consistently sensed, although sometimes with a brief delay (several seconds), and her cough does not eject aspirates from her airway. A chin tuck with thin liquids helps to reduce airway compromise to flash penetration. Flash penetration occurs with nectar thick liquids as well without use of chin tuck, even as pt takes very large sips. Recommend Dys 3 diet and thin liquids with chin tuck. Will f/u for tolerance and ability to implement strategies consistently. If pt continues to cough at bedside, downgrade to nectar thick liquids could be considered. SLP Visit Diagnosis Dysphagia, oropharyngeal phase (R13.12) Attention and concentration deficit following -- Frontal lobe and executive function deficit following -- Impact on safety and function Mild aspiration risk   CHL IP TREATMENT RECOMMENDATION 08/17/2018 Treatment Recommendations Therapy as  outlined in treatment plan below   Prognosis 08/17/2018 Prognosis for Safe Diet Advancement Good Barriers to Reach Goals Time post onset Barriers/Prognosis Comment -- CHL IP DIET RECOMMENDATION 08/17/2018 SLP Diet Recommendations Dysphagia 3 (Mech soft) solids;Thin liquid Liquid Administration via Cup;Straw Medication Administration Whole meds with puree Compensations Minimize environmental distractions;Slow rate;Small sips/bites;Chin tuck Postural Changes Seated upright at 90 degrees;Remain semi-upright after after feeds/meals (Comment)   CHL IP OTHER RECOMMENDATIONS 08/17/2018 Recommended Consults -- Oral Care Recommendations Oral care BID Other Recommendations --   CHL IP FOLLOW UP RECOMMENDATIONS 08/17/2018 Follow up Recommendations (No Data)   CHL IP FREQUENCY AND DURATION 08/17/2018 Speech Therapy Frequency (ACUTE ONLY) min 2x/week Treatment Duration 2 weeks      CHL IP ORAL PHASE 08/17/2018 Oral Phase Impaired Oral - Pudding Teaspoon -- Oral - Pudding Cup -- Oral - Honey Teaspoon -- Oral - Honey Cup -- Oral - Nectar Teaspoon -- Oral - Nectar Cup Lingual/palatal residue Oral - Nectar Straw Lingual/palatal residue Oral - Thin Teaspoon -- Oral - Thin Cup Lingual/palatal residue Oral - Thin Straw Lingual/palatal residue Oral - Puree Lingual/palatal residue Oral - Mech Soft Lingual/palatal residue Oral - Regular -- Oral - Multi-Consistency -- Oral - Pill -- Oral Phase - Comment --  CHL IP PHARYNGEAL PHASE 08/17/2018 Pharyngeal Phase Impaired Pharyngeal- Pudding Teaspoon -- Pharyngeal -- Pharyngeal- Pudding Cup -- Pharyngeal -- Pharyngeal- Honey Teaspoon -- Pharyngeal -- Pharyngeal- Honey Cup -- Pharyngeal -- Pharyngeal- Nectar Teaspoon -- Pharyngeal -- Pharyngeal- Nectar Cup Delayed swallow initiation-vallecula Pharyngeal -- Pharyngeal- Nectar Straw Penetration/Aspiration during swallow Pharyngeal Material enters airway, remains ABOVE vocal cords then ejected out Pharyngeal- Thin Teaspoon -- Pharyngeal -- Pharyngeal- Thin Cup  Penetration/Aspiration during swallow Pharyngeal Material enters airway, passes BELOW cords and not ejected out despite cough attempt by patient Pharyngeal- Thin Straw Penetration/Aspiration during swallow;Compensatory strategies attempted (with notebox) Pharyngeal Material enters airway, passes BELOW cords and not ejected out despite cough attempt by patient Pharyngeal- Puree WFL Pharyngeal -- Pharyngeal- Mechanical Soft WFL Pharyngeal -- Pharyngeal- Regular -- Pharyngeal -- Pharyngeal- Multi-consistency -- Pharyngeal -- Pharyngeal- Pill -- Pharyngeal -- Pharyngeal Comment --  CHL IP CERVICAL ESOPHAGEAL PHASE 08/17/2018 Cervical Esophageal Phase WFL Pudding Teaspoon -- Pudding Cup -- Honey Teaspoon -- Honey Cup -- Nectar Teaspoon -- Nectar Cup -- Nectar Straw -- Thin Teaspoon -- Thin Cup -- Thin Straw --  Puree -- Mechanical Soft -- Regular -- Multi-consistency -- Pill -- Cervical Esophageal Comment -- [name] 08/17/2018, 11:24 AM  [name], M.A. CCC-SLP Acute Rehabilitation Services Pager [phone] Office [phone]             Ct Head Code Stroke Wo Contrast  Result Date: 07/27/2018 CLINICAL DATA:  Code stroke.  Altered mental status EXAM: CT HEAD WITHOUT CONTRAST TECHNIQUE: Contiguous axial images were obtained from the base of the skull through the vertex without intravenous contrast. COMPARISON:  Head CT 04/18/2018 FINDINGS: Brain: There is no mass, hemorrhage or extra-axial collection. The size and configuration of the ventricles and extra-axial CSF spaces are normal. There is a prominent perivascular space of the left lentiform nucleus, unchanged. Mild white matter hypoattenuation compatible with chronic small vessel disease. Vascular: No abnormal hyperdensity of the major intracranial arteries or dural venous sinuses. No intracranial atherosclerosis. Skull: The visualized skull base, calvarium and extracranial soft tissues are normal. Sinuses/Orbits: No fluid levels or advanced  mucosal thickening of the visualized paranasal sinuses. No mastoid or middle ear effusion. The orbits are normal. ASPECTS [facility] Stroke Program Early CT Score) - Ganglionic level infarction (caudate, lentiform nuclei, internal capsule, insula, M1-M3 cortex): 7 - Supraganglionic infarction (M4-M6 cortex): 3 Total score (0-10 with 10 being normal): 10 IMPRESSION: 1. No hemorrhage or other acute abnormality. 2. ASPECTS is 10. These results were called by telephone at the time of interpretation on 07/27/2018 at 3:07 pm to Dr. Nanda [place] , who verbally acknowledged these results. Electronically Signed   By: Ulyses Jarred M.D.   On: 07/27/2018 15:08    Microbiology: Recent Results (from the past 240 hour(s))  Urine Culture     Status: None   Collection Time: 08/16/18  1:58 AM  Result Value Ref Range Status   Specimen Description   Final    URINE, RANDOM Performed at [facility], [address] [place], [place] [address]    Special Requests   Final    NONE Performed at [facility], [address], [place], [place] [address]    Culture   Final    NO GROWTH Performed at [facility], [place] [address] [place], [place] [address]    Report Status 08/17/2018 FINAL  Final  Culture, blood (Routine X 2) w Reflex to ID Panel     Status: None (Preliminary result)   Collection Time: 08/16/18  2:40 AM  Result Value Ref Range Status   Specimen Description BLOOD LEFT HAND  Final   Special Requests   Final    BOTTLES DRAWN AEROBIC AND ANAEROBIC Blood Culture adequate volume   Culture   Final    NO GROWTH 3 DAYS Performed at [facility], [address] [place], [place] [address]    Report Status PENDING  Incomplete  Culture, blood (Routine X 2) w Reflex to ID Panel     Status: None (Preliminary result)   Collection Time: 08/16/18  2:48 AM  Result Value Ref Range Status   Specimen Description BLOOD RIGHT HAND  Final   Special Requests   Final    BOTTLES DRAWN AEROBIC AND ANAEROBIC Blood Culture  adequate volume   Culture   Final    NO GROWTH 3 DAYS Performed at [facility], [address] [place], [place] [address]    Report Status PENDING  Incomplete  MRSA PCR Screening     Status: None   Collection Time: 08/16/18  5:49 AM  Result Value Ref Range Status  MRSA by PCR NEGATIVE NEGATIVE Final    Comment:        The GeneXpert MRSA Assay (FDA approved for NASAL specimens only), is one component of a comprehensive MRSA colonization surveillance program. It is not intended to diagnose MRSA infection nor to guide or monitor treatment for MRSA infections. Performed at [facility], [place] [address], [place], [place] [address]   Urine culture     Status: None   Collection Time: 08/16/18  6:27 AM  Result Value Ref Range Status   Specimen Description URINE, CATHETERIZED  Final   Special Requests NONE  Final   Culture   Final    NO GROWTH Performed at [facility], [address], [place],  [address]    Report Status 08/17/2018 FINAL  Final     Labs: CBC: Recent Labs  Lab 08/16/18 0150 08/16/18 0651 08/17/18 0447 08/18/18 0832 08/19/18 0910  WBC 10.7* 10.8* 7.2 7.1 7.1  NEUTROABS 8.1* 8.5* 5.1 5.2 5.1  HGB 9.7* 8.9* 8.1* 9.2* 9.4*  HCT 29.6* 26.4* 24.5* 27.1* 27.1*  MCV 96.1 97.1 96.1 96.8 94.4  PLT 217 205 186 187 503   Basic Metabolic Panel: Recent Labs  Lab 08/16/18 0150 08/16/18 0651 08/17/18 0447 08/18/18 0832 08/19/18 0910  NA 136 140 144 142 142  K 3.9 3.7 3.8 4.0 3.9  CL 105 106 114* 113* 109  CO2 22 26 23 22 26   GLUCOSE 324* 275* 127* 229* 224*  BUN 38* 34* 23 19 15   CREATININE 2.38* 2.33* 1.97* 1.95* 1.94*  CALCIUM 8.7* 8.4* 8.5* 8.8* 9.2  MG  --   --   --   --  2.0   Liver Function Tests: Recent Labs  Lab 08/15/18 1503 08/16/18 0150 08/16/18 0651 08/18/18 0832 08/19/18 0910  AST 22 23 26 20 22   ALT 30 28 32 26 25  ALKPHOS 74 75 70 67 71  BILITOT 0.4 0.5 0.1* 0.3 0.5  PROT 6.4* 6.6 5.6* 5.9* 6.1*  ALBUMIN 3.2*  3.2* 2.7* 2.6* 2.6*   Recent Labs  Lab 08/15/18 1503  LIPASE 48   Recent Labs  Lab 08/16/18 0150  AMMONIA 19   Cardiac Enzymes: Recent Labs  Lab 08/15/18 1503  TROPONINI <0.03   BNP (last 3 results) Recent Labs    01/12/18 1056 08/15/18 1503  BNP 145.3* 100.0   CBG: Recent Labs  Lab 08/18/18 1243 08/18/18 1729 08/18/18 2105 08/19/18 0808 08/19/18 1221  GLUCAP 333* 175* 138* 216* 199*   Time spent: 35 minutes  Signed:  Berle Mull  [facility]  08/19/2018  , 1:29 PM

## 2018-08-19 NOTE — Progress Notes (Signed)
Contacted [name] at [facility] for O2 referral. [name] will f/u with time of delivery for O2. Will  [employer], SW to arrange transportation after O2 is delivered.

## 2018-08-19 NOTE — Progress Notes (Signed)
CSW discharged patient on 08/19/2018. Her assisted living facility could not accept her with the oxygen that she is currently on. EMS brought her back to the hospital.   CSW followed up with [facility]. They are unable to accept the patient until they have the oxygen in place. CSW asked how long it would take for the facility to get the o2, facility stated the earliest would be Monday.   If patient is medically stable and the facility has the oxygen then she will be ready for discharge on 08/21/2018.   CSW will continue to follow up with the facility to make sure that they have the proper equipment for the patient.   Domenic Schwab, MSW, [place]

## 2018-08-19 NOTE — Progress Notes (Addendum)
Received a call from [facility] [place], [place], stating that pt was D/C yesterday to [facility]. Pt needs O2 and the facility was not able to provide the O2. Met with RN and made her aware that we need O2 sats notes. Will continue to f/u to assist with home O2.

## 2018-08-19 NOTE — Significant Event (Signed)
Rapid Response Event Note  Overview: Neurologic - Altered Mental Status/Hallucinations  Initial Focused Assessment: Called by RN, patient started yelling that she was seeing monsters, had periods of staring off, and not responding to painful stimuli. Upon arrival, patient's eyes were opening, tongue rolling noticed (not new per RN), patient did respond to my questions, knew who she was and where is was, told me where she was born. Stated that she was having hallucinations. VSS. Able to move all extremities to commands. I asked the RN to paged [facility] NP as well. Hx: Schizophrenia, depression, and seizures.   Interventions: - RN gave Ativan 1 mg IV.  Plan of Care: - Monitor neuro status  Event Summary:    at    Call Time 2324 Arrival Time [place], [place]

## 2018-08-20 LAB — GLUCOSE, CAPILLARY: Glucose-Capillary: 213 mg/dL — ABNORMAL HIGH (ref 70–99)

## 2018-08-20 NOTE — Progress Notes (Addendum)
Received call from [place] SW concerning patient needing oxygen for ALF; CM called [facility]; [facility] to call CM back concerning the progress of home oxygen. Aneta Mins [phone]  9:35 am - Received call from [facility] with [place]; patient's home oxygen was delivered to the facility yesterday at 6:46 pm and it is in her room at the ALF by the bed; [facility] updated; Aneta Mins [phone]

## 2018-08-20 NOTE — Discharge Summary (Signed)
[facility] Discharge Summary   Patient: [name] GQB:[id number]   PCP: Glenda Chroman, MD DOB: 09/10/1950   Date of admission: 08/16/2018   Date of discharge:  08/20/2018    Discharge Diagnoses:  Acute unresponsive event secondary to seizure. Acute hypoxic respiratory failure Hypothyroidism Schizophrenia Depression Chronic kidney disease stage IV Type 2 diabetes mellitus Chronic diastolic CHF  Admitted From: ALF Disposition: ALF with home health  Recommendations for Outpatient Follow-up:  1. Please follow-up with PCP in 1 week. 2. Please follow-up with neurology as recommended.   Contact information for follow-up providers    [facility] Follow up.   Specialty:  Home Health Services Why:  HHPT, HHOT Contact information: [place] [address] [phone]        Glenda Chroman, MD. Schedule an appointment as soon as possible for a visit in 1 week(s).   Specialty:  Internal Medicine Contact information: [address] [place] [address] [phone]        [facility]. Schedule an appointment as soon as possible for a visit in 1 month(s).   Specialty:  Neurology Contact information: [address] [place] [phone]           Contact information for after-discharge care    Destination    [facility] .   Service:  Assisted Living Contact information: [address] [place] [phone]                 Diet recommendation: full code  Activity: The patient is advised to gradually reintroduce usual activities.  Discharge Condition: good  Code Status: full code  History of present illness: As per the H and P dictated on admission, "Ms. [name] is a 68 year old lady with history of schizophrenia hypothyroidism diabetes mellitus type 2 chronic diastolic congestive heart failure presented with altered  mental status agitation to [facility] during her stay she started screaming complaining of " seeing monsters" and then she became unresponsive with deviation of her eyes and had to be intubated for airway protection and desaturation there was concern of possible seizure and was transferred to our hospital for further management.  Patient came in to our hospital sedated on propofol no visible seizures and obviously not able to get any history from the patient since her sedation and no family is available.  CT scan of the head did not show any acute event.  Patient had similar presentation mid of December where she was intubated there was no signs of seizures on her EEG with negative CT scan of her head and was concerned about polypharmacy causing her acute encephalopathy or catatonia."  Hospital Course:  Summary of her active problems in the hospital is as following. Acute hypoxic respiratory failure. Requiring mechanical ventilation for airway protection. Suspected seizures. Acute metabolic encephalopathy secondary to seizures. Initially admitted at any pain hospital, was intubated there for airway protection. Admitted to ICU. Patient was started on propofol. EEG was performed which did not show any evidence of active seizures. PCCM discussed with neurology who recommends keeping her on Keppra > will need outpatient Neuro consult Patient had an extensive work-up including an MRI, EEG, CT scan during prior admission as well as this admission which were unremarkable. Currently appears to be back to baseline.  Acute hypoxic respiratory failure:Intubated on admission- successfully extubated Need oxygen on discharge.   Hypertension: Relatively stable-continue Coreg, Demadex and Aldactone  on discharge and amlodipine as well.    Stage IV CKD: Creatinine close to usual baseline. Follow.  Chronic diastolic heart failure: Reasonably well compensated-continue Coreg and  Demadex.  Dyslipidemia:Continue statin  Uncontrolled type 2 diabetes mellitus, with renal dysfunction complication.   Continue home regimen for now. Further adjustment per PCP.  Hypothyroidism:Continue Synthroid  Schizophrenia:Apparently has a longstanding history of schizophrenia since she was 68 years old.  patient will need to follow-up with her primary psychiatrist in 1 week for further care.  Dysphagia:Initially kept n.p.o. postextubation-but upon follow-up by speech therapy-has been started on a dysphagia 3 diet. Per nursing staff she has been diet tolerating well.   Pulmonary hypertension > needs outpatient sleep study > resume aldactone and torsemide  Was on cephalexin and oral vancomycin prior to admission with no documentation anywhere why she was on these, will continue to hold and monitor for infection/diarrhea.  STABLE FOR DISCHARGE TO ALF.   All other chronic medical condition were stable during the hospitalization.  Patient was seen by physical therapy, who recommended home health, which was arranged by Education officer, museum and case Freight forwarder. On the day of the discharge the patient's vitals were stable , and no other acute medical condition were reported by patient. the patient was felt safe to be discharge at ALF with therapy.  Consultants: PCCM  Procedures: EEG  DISCHARGE MEDICATION: Allergies as of 08/20/2018      Reactions   Benztropine Mesylate Other (See Comments)   Alopecia and white spots on eyes   Codeine Swelling, Palpitations, Other (See Comments)   Mouth Swelling and Tachycardia   Diazepam Other (See Comments)   COMA   Diphenhydramine Hcl Anxiety, Palpitations, Other (See Comments)   Tachycardia and anxiousness   Penicillins Other (See Comments)   REACTION: ulcers in mouth, swelling, throat swelling  Has patient had a PCN reaction causing immediate rash, facial/tongue/throat swelling, SOB or lightheadedness with hypotension: YES Has patient had a  PCN reaction causing severe rash involving Mucus membranes or skin necrosis: Unknown Has patient had a PCN reaction that required hospitalization Unknown Has patient had a PCN reaction occurring within the last 10 years: unknown If all of the above answers are "NO", then may proceed with Cephalospori   Sulfonamide Derivatives Rash   Blisters, also   Thioridazine Hcl Swelling   Benztropine Other (See Comments)   "Alopecia and white spots on eyes"   Penicillin G Swelling, Other (See Comments)   Ulcers in mouth & tongue became swollen Has patient had a PCN reaction causing immediate rash, facial/tongue/throat swelling, SOB or lightheadedness with hypotension: Yes Has patient had a PCN reaction causing severe rash involving mucus membranes or skin necrosis: Unk Has patient had a PCN reaction that required hospitalization: Unk Has patient had a PCN reaction occurring within the last 10 years: Unk If all of the above answers are "NO", then may proceed with Cephalosporin use.   Lisinopril Cough   Sulfamethoxazole Rash      Medication List    STOP taking these medications   cephALEXin 500 MG capsule Commonly known as:  KEFLEX   vancomycin 250 MG capsule Commonly known as:  VANCOCIN     TAKE these medications   acetaminophen 325 MG tablet Commonly known as:  TYLENOL Take 325 mg by mouth 2 (two) times daily.   albuterol (2.5 MG/3ML) 0.083% nebulizer solution Commonly known as:  PROVENTIL Take 2.5 mg by nebulization every 6 (six) hours as needed for wheezing.   VENTOLIN HFA 108 (  90 Base) MCG/ACT inhaler Generic drug:  albuterol Inhale 1-2 puffs into the lungs every 6 (six) hours as needed for wheezing or shortness of breath.   amLODipine 5 MG tablet Commonly known as:  NORVASC Take 1 tablet (5 mg total) by mouth daily.   aspirin 81 MG tablet Take 81 mg by mouth daily.   atorvastatin 40 MG tablet Commonly known as:  LIPITOR Take 40 mg by mouth at bedtime.   carvedilol 6.25  MG tablet Commonly known as:  COREG Take 1 tablet (6.25 mg total) by mouth 2 (two) times daily with a meal.   DAILY VITE PO Take 1 tablet by mouth daily.   docusate sodium 100 MG capsule Commonly known as:  COLACE Take 100 mg by mouth daily.   EPINEPHrine 0.3 mg/0.3 mL Devi Commonly known as:  EPI-PEN Inject 0.3 mg into the muscle once as needed (for bee stings, then go to the ED immediately after using).   FLEET LIQUID GLYCERIN SUPP RE Place 1 suppository rectally daily as needed (for constipation).   glipiZIDE 5 MG 24 hr tablet Commonly known as:  GLUCOTROL XL Take 5 mg by mouth 2 (two) times daily.   haloperidol 0.5 MG tablet Commonly known as:  HALDOL Take 1 tablet (0.5 mg total) by mouth 2 (two) times daily.   hydrocortisone 2.5 % ointment Apply 1 application topically daily as needed (itchy skin).   levETIRAcetam 500 MG tablet Commonly known as:  KEPPRA Take 1 tablet (500 mg total) by mouth 2 (two) times daily.   levothyroxine 25 MCG tablet Commonly known as:  SYNTHROID, LEVOTHROID Take 1 tablet (25 mcg total) by mouth daily at 6 (six) AM.   linagliptin 5 MG Tabs tablet Commonly known as:  TRADJENTA Take 5 mg by mouth daily.   loperamide 2 MG capsule Commonly known as:  IMODIUM Take 2 mg by mouth 3 (three) times daily as needed (for diarrhea).   montelukast 10 MG tablet Commonly known as:  SINGULAIR Take 10 mg by mouth at bedtime.   omeprazole 20 MG capsule Commonly known as:  PRILOSEC Take 20 mg by mouth daily.   OVER THE COUNTER MEDICATION CPAP   polyethylene glycol powder powder Commonly known as:  GLYCOLAX/MIRALAX Take 17 g by mouth daily as needed (for constipation).   pyridOXINE 100 MG tablet Commonly known as:  VITAMIN B-6 Take 200 mg by mouth 2 (two) times daily.   spironolactone 25 MG tablet Commonly known as:  ALDACTONE Take 1 tablet (25 mg total) by mouth daily.   torsemide 20 MG tablet Commonly known as:  DEMADEX Take 60 mg  alternating with 40 mg daily. What changed:    how much to take  how to take this  when to take this  additional instructions   TRESIBA FLEXTOUCH 100 UNIT/ML Sopn FlexTouch Pen Generic drug:  insulin degludec Inject 20 Units into the skin at bedtime.   Vitamin D3 50 MCG (2000 UT) Tabs Take 4,000 Units by mouth daily.            Durable Medical Equipment  (From admission, onward)         Start     Ordered   08/19/18 1403  For home use only DME oxygen  Once    Question Answer Comment  Mode or (Route) Nasal cannula   Liters per Minute 2   Frequency Continuous (stationary and portable oxygen unit needed)   Oxygen delivery system Gas      08/19/18 1403  Allergies  Allergen Reactions  . Benztropine Mesylate Other (See Comments)    Alopecia and white spots on eyes  . Codeine Swelling, Palpitations and Other (See Comments)    Mouth Swelling and Tachycardia   . Diazepam Other (See Comments)    COMA  . Diphenhydramine Hcl Anxiety, Palpitations and Other (See Comments)    Tachycardia and anxiousness  . Penicillins Other (See Comments)    REACTION: ulcers in mouth, swelling, throat swelling  Has patient had a PCN reaction causing immediate rash, facial/tongue/throat swelling, SOB or lightheadedness with hypotension: YES Has patient had a PCN reaction causing severe rash involving Mucus membranes or skin necrosis: Unknown Has patient had a PCN reaction that required hospitalization Unknown Has patient had a PCN reaction occurring within the last 10 years: unknown If all of the above answers are "NO", then may proceed with Cephalospori  . Sulfonamide Derivatives Rash    Blisters, also  . Thioridazine Hcl Swelling  . Benztropine Other (See Comments)    "Alopecia and white spots on eyes"  . Penicillin G Swelling and Other (See Comments)    Ulcers in mouth & tongue became swollen Has patient had a PCN reaction causing immediate rash, facial/tongue/throat  swelling, SOB or lightheadedness with hypotension: Yes Has patient had a PCN reaction causing severe rash involving mucus membranes or skin necrosis: Unk Has patient had a PCN reaction that required hospitalization: Unk Has patient had a PCN reaction occurring within the last 10 years: Unk If all of the above answers are "NO", then may proceed with Cephalosporin use.   [name] Kitchen Lisinopril Cough  . Sulfamethoxazole Rash   Discharge Instructions    Ambulatory referral to Neurology   Complete by:  As directed    An appointment is requested in approximately: 4 weeks   DIET DYS 3   Complete by:  As directed    Fluid consistency:  Thin   Discharge instructions   Complete by:  As directed    It is important that you read following instructions as well as go over your medication list with RN to help you understand your care after this hospitalization.  Discharge Instructions: Please follow-up with PCP in one week  Please request your primary care physician to go over all Hospital Tests and Procedure/Radiological results at the follow up,  Please get all Hospital records sent to your PCP by signing hospital release before you go home.   Do not drive, operating heavy machinery, perform activities at heights, swimming or participation in water activities or provide baby sitting services; until you have been seen by Primary Care Physician or a Neurologist and advised to do so again. Do not take more than prescribed Pain, Sleep and Anxiety Medications. You were cared for by a hospitalist during your hospital stay. If you have any questions about your discharge medications or the care you received while you were in the hospital after you are discharged, you can call the unit you were admitted to and ask to speak with the hospitalist on call if the hospitalist that took care of you is not available.  Once you are discharged, your primary care physician will handle any further medical issues. Please note that  NO REFILLS for any discharge medications will be authorized once you are discharged, as it is imperative that you return to your primary care physician (or establish a relationship with a primary care physician if you do not have one) for your aftercare needs so that they can reassess your  need for medications and monitor your lab values. You Must read complete instructions/literature along with all the possible adverse reactions/side effects for all the Medicines you take and that have been prescribed to you. Take any new Medicines after you have completely understood and accept all the possible adverse reactions/side effects. Wear Seat belts while driving. If you have smoked or chewed Tobacco in the last 2 yrs please stop smoking and/or stop any Recreational drug use.   Increase activity slowly   Complete by:  As directed      Discharge Exam: Filed Weights   08/16/18 0600 08/16/18 2000 08/17/18 0415  Weight: 100.9 kg 100.9 kg 101.9 kg   Vitals:   08/19/18 2330 08/20/18 0834  BP: (!) 162/78 (!) 159/93  Pulse: 85 92  Resp: 17 16  Temp: 98.9 F (37.2 C) 98.5 F (36.9 C)  SpO2: 98% 98%   General: Appear in no distress, no Rash; Oral Mucosa moist. Cardiovascular: S1 and S2 Present, no Murmur, no JVD Respiratory: Bilateral Air entry present and Clear to Auscultation, no Crackles, no wheezes Abdomen: Bowel Sound present, Soft and no tenderness Extremities: no Pedal edema, no calf tenderness Neurology: Grossly no focal neuro deficit.  The results of significant diagnostics from this hospitalization (including imaging, microbiology, ancillary and laboratory) are listed below for reference.    Significant Diagnostic Studies: Ct Head Wo Contrast  Result Date: 08/16/2018 CLINICAL DATA:  Acute onset of altered mental status. Patient unresponsive. EXAM: CT HEAD WITHOUT CONTRAST TECHNIQUE: Contiguous axial images were obtained from the base of the skull through the vertex without intravenous  contrast. COMPARISON:  CT of the head performed 08/11/2018 FINDINGS: Brain: No evidence of acute infarction, hemorrhage, hydrocephalus, extra-axial collection or mass lesion/mass effect. Mild periventricular white matter change likely reflects small vessel ischemic microangiopathy. A chronic lacunar infarct is noted at the left basal ganglia. The posterior fossa, including the cerebellum, brainstem and fourth ventricle, is within normal limits. The third and lateral ventricles are unremarkable in appearance. The cerebral hemispheres are symmetric in appearance, with normal gray-white differentiation. No mass effect or midline shift is seen. Vascular: No hyperdense vessel or unexpected calcification. Skull: There is no evidence of fracture; visualized osseous structures are unremarkable in appearance. Sinuses/Orbits: The orbits are within normal limits. Mucosal thickening is noted at the maxillary sinuses bilaterally, with a likely mucus retention cyst or polyp at the left maxillary sinus. The remaining paranasal sinuses and mastoid air cells are well-aerated. Other: No significant soft tissue abnormalities are seen. IMPRESSION: 1. No acute intracranial pathology seen on CT. 2. Mild small vessel ischemic microangiopathy. 3. 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 4. Mucosal thickening at the maxillary sinuses bilaterally, with a likely mucus retention cyst or polyp at the left maxillary sinus. Electronically Signed   By: Garald Balding M.D.   On: 08/16/2018 02:23   Mr Jodene Nam Head Wo Contrast  Result Date: 07/27/2018 CLINICAL DATA:  Acute onset somnolence.  Altered mental status. EXAM: MRI HEAD WITHOUT CONTRAST MRA HEAD WITHOUT CONTRAST TECHNIQUE: Multiplanar, multiecho pulse sequences of the brain and surrounding structures were obtained without intravenous contrast. Angiographic images of the head were obtained using MRA technique without contrast. COMPARISON:  Head CT 07/27/2018 Brain MRI 04/19/2018  FINDINGS: An abbreviated protocol was performed. Sagittal T1, axial T2, axial T1 and coronal T2-weighted imaging was not obtained. MRI HEAD FINDINGS BRAIN: There is no acute infarct, acute hemorrhage or mass effect. The midline structures are normal. There are no old infarcts. Multifocal  white matter hyperintensity, most commonly due to chronic ischemic microangiopathy. Mild generalized atrophy. SKULL AND UPPER CERVICAL SPINE: The visualized skull base, calvarium, upper cervical spine and extracranial soft tissues are normal. SINUSES/ORBITS: No fluid levels or advanced mucosal thickening. No mastoid or middle ear effusion. The orbits are normal. MRA HEAD FINDINGS Intracranial internal carotid arteries: Normal. Anterior cerebral arteries: Normal. Middle cerebral arteries: Normal. Posterior communicating arteries: Present on the right only. Posterior cerebral arteries: Normal. Basilar artery: Normal. Vertebral arteries: Left dominant. Normal. Superior cerebellar arteries: Normal. Inferior cerebellar arteries: Normal. IMPRESSION: 1. No acute intracranial abnormality. 2. Findings of chronic microvascular ischemia. 3. Normal intracranial MRA. Electronically Signed   By: Ulyses Jarred M.D.   On: 07/27/2018 19:56   Mr Brain Wo Contrast  Result Date: 07/27/2018 CLINICAL DATA:  Acute onset somnolence.  Altered mental status. EXAM: MRI HEAD WITHOUT CONTRAST MRA HEAD WITHOUT CONTRAST TECHNIQUE: Multiplanar, multiecho pulse sequences of the brain and surrounding structures were obtained without intravenous contrast. Angiographic images of the head were obtained using MRA technique without contrast. COMPARISON:  Head CT 07/27/2018 Brain MRI 04/19/2018 FINDINGS: An abbreviated protocol was performed. Sagittal T1, axial T2, axial T1 and coronal T2-weighted imaging was not obtained. MRI HEAD FINDINGS BRAIN: There is no acute infarct, acute hemorrhage or mass effect. The midline structures are normal. There are no old infarcts.  Multifocal white matter hyperintensity, most commonly due to chronic ischemic microangiopathy. Mild generalized atrophy. SKULL AND UPPER CERVICAL SPINE: The visualized skull base, calvarium, upper cervical spine and extracranial soft tissues are normal. SINUSES/ORBITS: No fluid levels or advanced mucosal thickening. No mastoid or middle ear effusion. The orbits are normal. MRA HEAD FINDINGS Intracranial internal carotid arteries: Normal. Anterior cerebral arteries: Normal. Middle cerebral arteries: Normal. Posterior communicating arteries: Present on the right only. Posterior cerebral arteries: Normal. Basilar artery: Normal. Vertebral arteries: Left dominant. Normal. Superior cerebellar arteries: Normal. Inferior cerebellar arteries: Normal. IMPRESSION: 1. No acute intracranial abnormality. 2. Findings of chronic microvascular ischemia. 3. Normal intracranial MRA. Electronically Signed   By: Ulyses Jarred M.D.   On: 07/27/2018 19:56   US Venous Img Lower Bilateral  Result Date: 08/15/2018 CLINICAL DATA:  Bilateral lower extremity pain and edema since this morning. Evaluate for DVT. EXAM: BILATERAL LOWER EXTREMITY VENOUS DOPPLER ULTRASOUND TECHNIQUE: Gray-scale sonography with graded compression, as well as color Doppler and duplex ultrasound were performed to evaluate the lower extremity deep venous systems from the level of the common femoral vein and including the common femoral, femoral, profunda femoral, popliteal and calf veins including the posterior tibial, peroneal and gastrocnemius veins when visible. The superficial great saphenous vein was also interrogated. Spectral Doppler was utilized to evaluate flow at rest and with distal augmentation maneuvers in the common femoral, femoral and popliteal veins. COMPARISON:  None. FINDINGS: RIGHT LOWER EXTREMITY Common Femoral Vein: No evidence of thrombus. Normal compressibility, respiratory phasicity and response to augmentation. Saphenofemoral Junction: No  evidence of thrombus. Normal compressibility and flow on color Doppler imaging. Profunda Femoral Vein: No evidence of thrombus. Normal compressibility and flow on color Doppler imaging. Femoral Vein: No evidence of thrombus. Normal compressibility, respiratory phasicity and response to augmentation. Popliteal Vein: No evidence of thrombus. Normal compressibility, respiratory phasicity and response to augmentation. Calf Veins: No evidence of thrombus. Normal compressibility and flow on color Doppler imaging. Superficial Great Saphenous Vein: No evidence of thrombus. Normal compressibility. Venous Reflux:  None. Other Findings:  None. LEFT LOWER EXTREMITY Common Femoral Vein: No evidence of thrombus. Normal compressibility, respiratory  phasicity and response to augmentation. Saphenofemoral Junction: No evidence of thrombus. Normal compressibility and flow on color Doppler imaging. Profunda Femoral Vein: No evidence of thrombus. Normal compressibility and flow on color Doppler imaging. Femoral Vein: No evidence of thrombus. Normal compressibility, respiratory phasicity and response to augmentation. Popliteal Vein: No evidence of thrombus. Normal compressibility, respiratory phasicity and response to augmentation. Calf Veins: No evidence of thrombus. Normal compressibility and flow on color Doppler imaging. Superficial Great Saphenous Vein: No evidence of thrombus. Normal compressibility. Venous Reflux:  None. Other Findings:  None. IMPRESSION: No evidence of DVT within either lower extremity. Electronically Signed   By: Sandi Mariscal M.D.   On: 08/15/2018 17:09   Dg Chest Port 1 View  Result Date: 08/16/2018 CLINICAL DATA:  Intubation EXAM: PORTABLE CHEST 1 VIEW COMPARISON:  08/16/2018 at 1:43 a.m. FINDINGS: Endotracheal tube tip is just below the level of the clavicles, approximately 1.3 cm above the inferior margin of the carina. The enteric tube tip is beyond the field of view. The side port is not clearly  visualized. There is shallow lung inflation with bibasilar atelectasis. Unchanged cardiomegaly. IMPRESSION: Endotracheal tube tip just below the level of the clavicles, approximately 1.3 cm above the inferior margin of the carina. Enteric tube tip beyond the field of view. Electronically Signed   By: Ulyses Jarred M.D.   On: 08/16/2018 06:22   Dg Chest Port 1 View  Result Date: 08/16/2018 CLINICAL DATA:  Acute onset of altered mental status. Endotracheal tube placement. EXAM: PORTABLE CHEST 1 VIEW COMPARISON:  Chest radiograph performed 08/15/2018 FINDINGS: The patient's endotracheal tube is seen ending 1.5 cm above the carina. This could be retracted 2 cm. The patient's enteric tube is seen ending overlying the body of the stomach, with the side port about the gastroesophageal junction. This could be advanced 3 cm. The lungs are mildly hypoexpanded. Vascular congestion and vascular crowding are noted. No pleural effusion or pneumothorax is seen The cardiomediastinal silhouette is mildly enlarged. No acute osseous abnormalities are identified. IMPRESSION: 1. Endotracheal tube seen ending 1.5 cm above the carina. This could be retracted 2 cm. 2. Enteric tube noted ending overlying the body of the stomach, with the side port about the gastroesophageal junction. This could be advanced 3 cm. 3. Lungs mildly hypoexpanded but grossly clear. Vascular congestion and mild cardiomegaly noted. Electronically Signed   By: Garald Balding M.D.   On: 08/16/2018 02:03   Dg Chest Port 1 View  Result Date: 08/15/2018 CLINICAL DATA:  68 year old female with chest pain and weakness. EXAM: PORTABLE CHEST 1 VIEW COMPARISON:  [facility] 08/11/2018 and earlier. FINDINGS: Portable AP upright view at 1453 hours. Low but improved lung volumes. Normal cardiac size and mediastinal contours. Visualized tracheal air column is within normal limits. Allowing for portable technique the lungs are clear. No pneumothorax. Paucity  of bowel gas in the upper abdomen. Visible osseous structures appear intact. IMPRESSION: No acute cardiopulmonary abnormality. Electronically Signed   By: Genevie Ann M.D.   On: 08/15/2018 15:07   Dg Chest Port 1 View  Result Date: 07/28/2018 CLINICAL DATA:  Hypoxia EXAM: PORTABLE CHEST 1 VIEW COMPARISON:  July 27, 2018 FINDINGS: Endotracheal tube tip is 2.2 cm above the carina. Nasogastric tube tip and side port are below the diaphragm. No pneumothorax. There is atelectatic change in the lung bases. The lungs elsewhere are clear. Heart is mildly enlarged with pulmonary vascularity normal. No adenopathy. No appreciable bone lesions. IMPRESSION: Tube positions as described without  pneumothorax. Bibasilar atelectasis. No frank airspace consolidation. Stable cardiac prominence. Electronically Signed   By: Lowella Grip III M.D.   On: 07/28/2018 07:22   Dg Chest Portable 1 View  Result Date: 07/27/2018 CLINICAL DATA:  Tube placement. EXAM: PORTABLE CHEST 1 VIEW COMPARISON:  07/14/2018. FINDINGS: Endotracheal tube tip noted approximately 7 mm above the lower portion of the carina. Proximal repositioning of approximately 2 cm should be considered. NG tube noted with tip below left hemidiaphragm. Cardiomegaly with pulmonary venous congestion bilateral interstitial prominence. Findings consistent with CHF. No pneumothorax. IMPRESSION: 1. Endotracheal tube noted with its tip approximately 7 mm above the lower portion of the carina. Proximal repositioning of approximately 2 cm should be considered. NG tube noted with tip below left hemidiaphragm. 2. Cardiomegaly with pulmonary venous congestion bilateral interstitial prominence consistent with CHF. Electronically Signed   By: Marcello Moores  Register   On: 07/27/2018 16:23   Dg Abd Portable 1v  Result Date: 08/19/2018 CLINICAL DATA:  68 year old female with abdominal pain EXAM: PORTABLE ABDOMEN - 1 VIEW COMPARISON:  Prior CT scan of the abdomen and pelvis 06/01/2018;  prior modified barium swallow 08/17/2018 FINDINGS: A total of 3 radiographs were required to encompass the entirety of the abdomen. All images are supine. Incompletely imaged cardiomegaly. The lung bases are grossly clear. No significant bowel dilatation. Previously ingested oral contrast material opacifies the colon. Unremarkable colonic stool burden. There are a few small diverticula in both the ascending and descending colon. IMPRESSION: No evidence of bowel obstruction. Previously ingested contrast material opacifies the colon. Electronically Signed   By: Jacqulynn Cadet M.D.   On: 08/19/2018 09:01   Dg Swallowing Func-speech Pathology  Result Date: 08/17/2018 Objective Swallowing Evaluation: Type of Study: MBS-Modified Barium Swallow Study  Patient Details Name: Kikue Gerhart MRN: [id number] Date of Birth: Jan 04, 1951 Today's Date: 08/17/2018 Time: SLP Start Time (ACUTE ONLY): 1007 -SLP Stop Time (ACUTE ONLY): 1023 SLP Time Calculation (min) (ACUTE ONLY): 16 min Past Medical History: Past Medical History: Diagnosis Date . Anxiety  . Asthma  . Back pain  . Barrett esophagus  . CHF (congestive heart failure) ([place])  . CKD (chronic kidney disease) stage 3, GFR 30-59 ml/min (HCC)  . COPD (chronic obstructive pulmonary disease) ([place])  . Depression  . Diastolic heart failure ([place])  . Essential hypertension  . Fibromyalgia  . GERD (gastroesophageal reflux disease)  . Gout  . History of pericarditis  . Hyperlipidemia  . Hypothyroid  . Major depressive disorder  . Memory loss  . Nephrolithiasis   2008 . Obesity hypoventilation syndrome (HCC)   CPAP . Obstructive sleep apnea  . PE (pulmonary embolism)   2010 . Peripheral neuropathy  . PTSD (post-traumatic stress disorder)  . Rheumatoid arthritis ([place])  . Schizophrenia ([place])  . Steroid dependence  . Type 2 diabetes mellitus ([place])  . Vitamin D deficiency  Past Surgical History: Past Surgical History: Procedure Laterality Date . APPENDECTOMY   . CHOLECYSTECTOMY   .  COLONOSCOPY    benign polyps (12/2000), repeat colonoscopy performed in 2007 . ENDOMETRIAL BIOPSY    10/03 benign columnar mucosa (limited material) . KIDNEY SURGERY   HPI: Pt is a 68 year old lady admitted with AMS and suspected seizure, intubated 1/1 for <24 hours. Pt was recently admitted (December 2019) with similar presentation, with SLP clinical evaluation initially recommending NPO but advanced to Dys 3, thin liquids prior to d/c. Pt says she gets "choked" on liquids "all the time" and believes she has had a swallow study before  but there is no documentation of this in her chart. PMH includes: schizophrenia, DM, RA, PTSD, PE, OSA, memory loss, GERD, COPD, CKD, CHF, barrett's esophagus, asthma, anxiety  Subjective: pt says she coughs with liquids "a lot" and that it has been going on for "a long time" but cannot specify further Assessment / Plan / Recommendation CHL IP CLINICAL IMPRESSIONS 08/17/2018 Clinical Impression Pt has a mild oropharyngeal dysphagia due to impaired timing of airway closure, resulting in aspiration during the swallow with thin liquids. Pt also has mild lingual residue after the swallow with all consistencies, although with oral residuals spilling posteriorly into the pharynx with thin liquids. This did not result in any further aspiration during the study, but positioning in bed and impulsivity could allow for aspiration after the swallow during meals, particularly as pt does not spontaneously manage her residuals. When pt does aspirate during the swallow it is consistently sensed, although sometimes with a brief delay (several seconds), and her cough does not eject aspirates from her airway. A chin tuck with thin liquids helps to reduce airway compromise to flash penetration. Flash penetration occurs with nectar thick liquids as well without use of chin tuck, even as pt takes very large sips. Recommend Dys 3 diet and thin liquids with chin tuck. Will f/u for tolerance and ability to  implement strategies consistently. If pt continues to cough at bedside, downgrade to nectar thick liquids could be considered. SLP Visit Diagnosis Dysphagia, oropharyngeal phase (R13.12) Attention and concentration deficit following -- Frontal lobe and executive function deficit following -- Impact on safety and function Mild aspiration risk   CHL IP TREATMENT RECOMMENDATION 08/17/2018 Treatment Recommendations Therapy as outlined in treatment plan below   Prognosis 08/17/2018 Prognosis for Safe Diet Advancement Good Barriers to Reach Goals Time post onset Barriers/Prognosis Comment -- CHL IP DIET RECOMMENDATION 08/17/2018 SLP Diet Recommendations Dysphagia 3 (Mech soft) solids;Thin liquid Liquid Administration via Cup;Straw Medication Administration Whole meds with puree Compensations Minimize environmental distractions;Slow rate;Small sips/bites;Chin tuck Postural Changes Seated upright at 90 degrees;Remain semi-upright after after feeds/meals (Comment)   CHL IP OTHER RECOMMENDATIONS 08/17/2018 Recommended Consults -- Oral Care Recommendations Oral care BID Other Recommendations --   CHL IP FOLLOW UP RECOMMENDATIONS 08/17/2018 Follow up Recommendations (No Data)   CHL IP FREQUENCY AND DURATION 08/17/2018 Speech Therapy Frequency (ACUTE ONLY) min 2x/week Treatment Duration 2 weeks      CHL IP ORAL PHASE 08/17/2018 Oral Phase Impaired Oral - Pudding Teaspoon -- Oral - Pudding Cup -- Oral - Honey Teaspoon -- Oral - Honey Cup -- Oral - Nectar Teaspoon -- Oral - Nectar Cup Lingual/palatal residue Oral - Nectar Straw Lingual/palatal residue Oral - Thin Teaspoon -- Oral - Thin Cup Lingual/palatal residue Oral - Thin Straw Lingual/palatal residue Oral - Puree Lingual/palatal residue Oral - Mech Soft Lingual/palatal residue Oral - Regular -- Oral - Multi-Consistency -- Oral - Pill -- Oral Phase - Comment --  CHL IP PHARYNGEAL PHASE 08/17/2018 Pharyngeal Phase Impaired Pharyngeal- Pudding Teaspoon -- Pharyngeal -- Pharyngeal- Pudding Cup  -- Pharyngeal -- Pharyngeal- Honey Teaspoon -- Pharyngeal -- Pharyngeal- Honey Cup -- Pharyngeal -- Pharyngeal- Nectar Teaspoon -- Pharyngeal -- Pharyngeal- Nectar Cup Delayed swallow initiation-vallecula Pharyngeal -- Pharyngeal- Nectar Straw Penetration/Aspiration during swallow Pharyngeal Material enters airway, remains ABOVE vocal cords then ejected out Pharyngeal- Thin Teaspoon -- Pharyngeal -- Pharyngeal- Thin Cup Penetration/Aspiration during swallow Pharyngeal Material enters airway, passes BELOW cords and not ejected out despite cough attempt by patient Pharyngeal- Thin Straw Penetration/Aspiration during swallow;Compensatory strategies  attempted (with notebox) Pharyngeal Material enters airway, passes BELOW cords and not ejected out despite cough attempt by patient Pharyngeal- Puree WFL Pharyngeal -- Pharyngeal- Mechanical Soft WFL Pharyngeal -- Pharyngeal- Regular -- Pharyngeal -- Pharyngeal- Multi-consistency -- Pharyngeal -- Pharyngeal- Pill -- Pharyngeal -- Pharyngeal Comment --  CHL IP CERVICAL ESOPHAGEAL PHASE 08/17/2018 Cervical Esophageal Phase WFL Pudding Teaspoon -- Pudding Cup -- Honey Teaspoon -- Honey Cup -- Nectar Teaspoon -- Nectar Cup -- Nectar Straw -- Thin Teaspoon -- Thin Cup -- Thin Straw -- Puree -- Mechanical Soft -- Regular -- Multi-consistency -- Pill -- Cervical Esophageal Comment -- [name] 08/17/2018, 11:24 AM  [name], M.A. CCC-SLP Acute Rehabilitation Services Pager [phone] Office [phone]             Ct Head Code Stroke Wo Contrast  Result Date: 07/27/2018 CLINICAL DATA:  Code stroke.  Altered mental status EXAM: CT HEAD WITHOUT CONTRAST TECHNIQUE: Contiguous axial images were obtained from the base of the skull through the vertex without intravenous contrast. COMPARISON:  Head CT 04/18/2018 FINDINGS: Brain: There is no mass, hemorrhage or extra-axial collection. The size and configuration of the ventricles and extra-axial CSF spaces are normal.  There is a prominent perivascular space of the left lentiform nucleus, unchanged. Mild white matter hypoattenuation compatible with chronic small vessel disease. Vascular: No abnormal hyperdensity of the major intracranial arteries or dural venous sinuses. No intracranial atherosclerosis. Skull: The visualized skull base, calvarium and extracranial soft tissues are normal. Sinuses/Orbits: No fluid levels or advanced mucosal thickening of the visualized paranasal sinuses. No mastoid or middle ear effusion. The orbits are normal. ASPECTS [facility] Stroke Program Early CT Score) - Ganglionic level infarction (caudate, lentiform nuclei, internal capsule, insula, M1-M3 cortex): 7 - Supraganglionic infarction (M4-M6 cortex): 3 Total score (0-10 with 10 being normal): 10 IMPRESSION: 1. No hemorrhage or other acute abnormality. 2. ASPECTS is 10. These results were called by telephone at the time of interpretation on 07/27/2018 at 3:07 pm to Dr. Nanda Quinton , who verbally acknowledged these results. Electronically Signed   By: Ulyses Jarred M.D.   On: 07/27/2018 15:08    Microbiology: Recent Results (from the past 240 hour(s))  Urine Culture     Status: None   Collection Time: 08/16/18  1:58 AM  Result Value Ref Range Status   Specimen Description   Final    URINE, RANDOM Performed at [facility], [address] [place], [place] [address]    Special Requests   Final    NONE Performed at [facility], [address], [place], [place] [address]    Culture   Final    NO GROWTH Performed at [facility], [place] [address], [place], [place] [address]    Report Status 08/17/2018 FINAL  Final  Culture, blood (Routine X 2) w Reflex to ID Panel     Status: None (Preliminary result)   Collection Time: 08/16/18  2:40 AM  Result Value Ref Range Status   Specimen Description BLOOD LEFT HAND  Final   Special Requests   Final    BOTTLES DRAWN AEROBIC AND ANAEROBIC Blood Culture adequate volume   Culture   Final     NO GROWTH 3 DAYS Performed at [facility], [address], [place], [place] [address]    Report Status PENDING  Incomplete  Culture, blood (Routine X 2) w Reflex to ID Panel     Status: None (Preliminary result)   Collection Time: 08/16/18  2:48 AM  Result  Value Ref Range Status   Specimen Description BLOOD RIGHT HAND  Final   Special Requests   Final    BOTTLES DRAWN AEROBIC AND ANAEROBIC Blood Culture adequate volume   Culture   Final    NO GROWTH 3 DAYS Performed at [facility], [address], [place], [place] [address]    Report Status PENDING  Incomplete  MRSA PCR Screening     Status: None   Collection Time: 08/16/18  5:49 AM  Result Value Ref Range Status   MRSA by PCR NEGATIVE NEGATIVE Final    Comment:        The GeneXpert MRSA Assay (FDA approved for NASAL specimens only), is one component of a comprehensive MRSA colonization surveillance program. It is not intended to diagnose MRSA infection nor to guide or monitor treatment for MRSA infections. Performed at [facility], [place] [address], [place], [place] [address]   Urine culture     Status: None   Collection Time: 08/16/18  6:27 AM  Result Value Ref Range Status   Specimen Description URINE, CATHETERIZED  Final   Special Requests NONE  Final   Culture   Final    NO GROWTH Performed at [facility], [address] [place],  [address]    Report Status 08/17/2018 FINAL  Final     Labs: CBC: Recent Labs  Lab 08/16/18 0150 08/16/18 0651 08/17/18 0447 08/18/18 0832 08/19/18 0910  WBC 10.7* 10.8* 7.2 7.1 7.1  NEUTROABS 8.1* 8.5* 5.1 5.2 5.1  HGB 9.7* 8.9* 8.1* 9.2* 9.4*  HCT 29.6* 26.4* 24.5* 27.1* 27.1*  MCV 96.1 97.1 96.1 96.8 94.4  PLT 217 205 186 187 233   Basic Metabolic Panel: Recent Labs  Lab 08/16/18 0150 08/16/18 0651 08/17/18 0447 08/18/18 0832 08/19/18 0910  NA 136 140 144 142 142  K 3.9 3.7 3.8 4.0 3.9  CL 105 106 114* 113* 109  CO2 22 26 23 22 26     GLUCOSE 324* 275* 127* 229* 224*  BUN 38* 34* 23 19 15   CREATININE 2.38* 2.33* 1.97* 1.95* 1.94*  CALCIUM 8.7* 8.4* 8.5* 8.8* 9.2  MG  --   --   --   --  2.0   Liver Function Tests: Recent Labs  Lab 08/15/18 1503 08/16/18 0150 08/16/18 0651 08/18/18 0832 08/19/18 0910  AST 22 23 26 20 22   ALT 30 28 32 26 25  ALKPHOS 74 75 70 67 71  BILITOT 0.4 0.5 0.1* 0.3 0.5  PROT 6.4* 6.6 5.6* 5.9* 6.1*  ALBUMIN 3.2* 3.2* 2.7* 2.6* 2.6*   Recent Labs  Lab 08/15/18 1503  LIPASE 48   Recent Labs  Lab 08/16/18 0150  AMMONIA 19   Cardiac Enzymes: Recent Labs  Lab 08/15/18 1503  TROPONINI <0.03   BNP (last 3 results) Recent Labs    01/12/18 1056 08/15/18 1503  BNP 145.3* 100.0   CBG: Recent Labs  Lab 08/19/18 0808 08/19/18 1221 08/19/18 1654 08/19/18 2118 08/20/18 0835  GLUCAP 216* 199* 253* 247* 213*   Time spent: 35 minutes  Signed:  Berle Mull  [facility]  08/20/2018  , 10:00 AM

## 2018-08-20 NOTE — Plan of Care (Signed)
Discharge

## 2018-08-20 NOTE — Progress Notes (Signed)
Patient will DC to: [facility], ALF Anticipated DC date: 08/20/2018 Family notified: Yes Transport by: Corey Harold   Per MD patient ready for DC to . RN, patient, patient's family, and facility notified of DC. Discharge Summary and FL2 sent to facility. RN to call report prior to discharge([phone]). DC packet on chart. Ambulance transport requested for patient.   CSW will sign off for now as social work intervention is no longer needed. Please consult [place] again if new needs arise.  Marialena Wollen, LCSW-A [place]/Clinical Social Work Department Cell: [phone]

## 2018-08-20 NOTE — Progress Notes (Signed)
Still following to see if RNCM was able to get o2 for the patient to be discharged back to her facility.   CSW will continue to monitor.   Domenic Schwab, MSW, [place]

## 2018-08-20 NOTE — Plan of Care (Signed)
[name]

## 2018-08-21 DIAGNOSIS — Z79899 Other long term (current) drug therapy: Secondary | ICD-10-CM | POA: Diagnosis not present

## 2018-08-21 DIAGNOSIS — F209 Schizophrenia, unspecified: Secondary | ICD-10-CM | POA: Diagnosis not present

## 2018-08-21 DIAGNOSIS — E039 Hypothyroidism, unspecified: Secondary | ICD-10-CM | POA: Diagnosis not present

## 2018-08-21 DIAGNOSIS — R52 Pain, unspecified: Secondary | ICD-10-CM | POA: Diagnosis not present

## 2018-08-21 DIAGNOSIS — Z9049 Acquired absence of other specified parts of digestive tract: Secondary | ICD-10-CM | POA: Diagnosis not present

## 2018-08-21 DIAGNOSIS — R0689 Other abnormalities of breathing: Secondary | ICD-10-CM | POA: Diagnosis not present

## 2018-08-21 DIAGNOSIS — Z8614 Personal history of Methicillin resistant Staphylococcus aureus infection: Secondary | ICD-10-CM | POA: Diagnosis not present

## 2018-08-21 DIAGNOSIS — Z888 Allergy status to other drugs, medicaments and biological substances status: Secondary | ICD-10-CM | POA: Diagnosis not present

## 2018-08-21 DIAGNOSIS — Z885 Allergy status to narcotic agent status: Secondary | ICD-10-CM | POA: Diagnosis not present

## 2018-08-21 DIAGNOSIS — N3091 Cystitis, unspecified with hematuria: Secondary | ICD-10-CM | POA: Diagnosis not present

## 2018-08-21 DIAGNOSIS — I509 Heart failure, unspecified: Secondary | ICD-10-CM | POA: Diagnosis not present

## 2018-08-21 DIAGNOSIS — R404 Transient alteration of awareness: Secondary | ICD-10-CM | POA: Diagnosis not present

## 2018-08-21 DIAGNOSIS — E785 Hyperlipidemia, unspecified: Secondary | ICD-10-CM | POA: Diagnosis not present

## 2018-08-21 DIAGNOSIS — Z794 Long term (current) use of insulin: Secondary | ICD-10-CM | POA: Diagnosis not present

## 2018-08-21 DIAGNOSIS — I700 Atherosclerosis of aorta: Secondary | ICD-10-CM | POA: Diagnosis not present

## 2018-08-21 DIAGNOSIS — Z880 Allergy status to penicillin: Secondary | ICD-10-CM | POA: Diagnosis not present

## 2018-08-21 DIAGNOSIS — R4182 Altered mental status, unspecified: Secondary | ICD-10-CM | POA: Diagnosis not present

## 2018-08-21 DIAGNOSIS — Z743 Need for continuous supervision: Secondary | ICD-10-CM | POA: Diagnosis not present

## 2018-08-21 DIAGNOSIS — I6381 Other cerebral infarction due to occlusion or stenosis of small artery: Secondary | ICD-10-CM | POA: Diagnosis not present

## 2018-08-21 DIAGNOSIS — Z882 Allergy status to sulfonamides status: Secondary | ICD-10-CM | POA: Diagnosis not present

## 2018-08-21 DIAGNOSIS — E119 Type 2 diabetes mellitus without complications: Secondary | ICD-10-CM | POA: Diagnosis not present

## 2018-08-21 DIAGNOSIS — E1165 Type 2 diabetes mellitus with hyperglycemia: Secondary | ICD-10-CM | POA: Diagnosis not present

## 2018-08-21 DIAGNOSIS — F329 Major depressive disorder, single episode, unspecified: Secondary | ICD-10-CM | POA: Diagnosis not present

## 2018-08-21 DIAGNOSIS — R319 Hematuria, unspecified: Secondary | ICD-10-CM | POA: Diagnosis not present

## 2018-08-21 LAB — CULTURE, BLOOD (ROUTINE X 2)
Culture: NO GROWTH
Culture: NO GROWTH
Special Requests: ADEQUATE
Special Requests: ADEQUATE

## 2018-08-22 ENCOUNTER — Emergency Department (HOSPITAL_COMMUNITY): Payer: Medicare Other

## 2018-08-22 ENCOUNTER — Other Ambulatory Visit: Payer: Self-pay

## 2018-08-22 ENCOUNTER — Emergency Department (HOSPITAL_COMMUNITY)
Admission: EM | Admit: 2018-08-22 | Discharge: 2018-08-23 | Disposition: A | Discharge status: Home / Self Care | Payer: Medicare Other | Source: Home / Self Care | Attending: Emergency Medicine | Admitting: Emergency Medicine

## 2018-08-22 ENCOUNTER — Emergency Department (HOSPITAL_COMMUNITY)
Admission: EM | Admit: 2018-08-22 | Discharge: 2018-08-22 | Disposition: A | Discharge status: Skilled Nursing Facility | Payer: Medicare Other | Attending: Emergency Medicine | Admitting: Emergency Medicine

## 2018-08-22 ENCOUNTER — Encounter (HOSPITAL_COMMUNITY): Payer: Self-pay

## 2018-08-22 ENCOUNTER — Encounter (HOSPITAL_COMMUNITY): Payer: Self-pay | Admitting: *Deleted

## 2018-08-22 DIAGNOSIS — E119 Type 2 diabetes mellitus without complications: Secondary | ICD-10-CM | POA: Insufficient documentation

## 2018-08-22 DIAGNOSIS — N184 Chronic kidney disease, stage 4 (severe): Secondary | ICD-10-CM | POA: Diagnosis not present

## 2018-08-22 DIAGNOSIS — I5032 Chronic diastolic (congestive) heart failure: Secondary | ICD-10-CM | POA: Diagnosis not present

## 2018-08-22 DIAGNOSIS — R404 Transient alteration of awareness: Secondary | ICD-10-CM | POA: Diagnosis not present

## 2018-08-22 DIAGNOSIS — Z79899 Other long term (current) drug therapy: Secondary | ICD-10-CM | POA: Diagnosis not present

## 2018-08-22 DIAGNOSIS — J45909 Unspecified asthma, uncomplicated: Secondary | ICD-10-CM | POA: Insufficient documentation

## 2018-08-22 DIAGNOSIS — Z87891 Personal history of nicotine dependence: Secondary | ICD-10-CM | POA: Insufficient documentation

## 2018-08-22 DIAGNOSIS — R4189 Other symptoms and signs involving cognitive functions and awareness: Secondary | ICD-10-CM

## 2018-08-22 DIAGNOSIS — D631 Anemia in chronic kidney disease: Secondary | ICD-10-CM | POA: Diagnosis not present

## 2018-08-22 DIAGNOSIS — I509 Heart failure, unspecified: Secondary | ICD-10-CM

## 2018-08-22 DIAGNOSIS — Z7982 Long term (current) use of aspirin: Secondary | ICD-10-CM | POA: Diagnosis not present

## 2018-08-22 DIAGNOSIS — N183 Chronic kidney disease, stage 3 (moderate): Secondary | ICD-10-CM | POA: Insufficient documentation

## 2018-08-22 DIAGNOSIS — R45851 Suicidal ideations: Secondary | ICD-10-CM | POA: Diagnosis not present

## 2018-08-22 DIAGNOSIS — F23 Brief psychotic disorder: Secondary | ICD-10-CM | POA: Insufficient documentation

## 2018-08-22 DIAGNOSIS — G9340 Encephalopathy, unspecified: Secondary | ICD-10-CM | POA: Diagnosis not present

## 2018-08-22 DIAGNOSIS — E039 Hypothyroidism, unspecified: Secondary | ICD-10-CM | POA: Insufficient documentation

## 2018-08-22 DIAGNOSIS — E1165 Type 2 diabetes mellitus with hyperglycemia: Secondary | ICD-10-CM | POA: Diagnosis not present

## 2018-08-22 DIAGNOSIS — I130 Hypertensive heart and chronic kidney disease with heart failure and stage 1 through stage 4 chronic kidney disease, or unspecified chronic kidney disease: Secondary | ICD-10-CM | POA: Insufficient documentation

## 2018-08-22 DIAGNOSIS — R4020 Unspecified coma: Secondary | ICD-10-CM | POA: Diagnosis not present

## 2018-08-22 DIAGNOSIS — N3001 Acute cystitis with hematuria: Secondary | ICD-10-CM | POA: Diagnosis not present

## 2018-08-22 DIAGNOSIS — R4182 Altered mental status, unspecified: Secondary | ICD-10-CM | POA: Diagnosis not present

## 2018-08-22 DIAGNOSIS — I10 Essential (primary) hypertension: Secondary | ICD-10-CM | POA: Diagnosis not present

## 2018-08-22 DIAGNOSIS — E1122 Type 2 diabetes mellitus with diabetic chronic kidney disease: Secondary | ICD-10-CM | POA: Diagnosis not present

## 2018-08-22 DIAGNOSIS — N3000 Acute cystitis without hematuria: Secondary | ICD-10-CM

## 2018-08-22 LAB — COMPREHENSIVE METABOLIC PANEL
ALT: 39 U/L (ref 0–44)
AST: 34 U/L (ref 15–41)
Albumin: 3.2 g/dL — ABNORMAL LOW (ref 3.5–5.0)
Alkaline Phosphatase: 74 U/L (ref 38–126)
Anion gap: 8 (ref 5–15)
BUN: 30 mg/dL — ABNORMAL HIGH (ref 8–23)
CO2: 24 mmol/L (ref 22–32)
Calcium: 8.7 mg/dL — ABNORMAL LOW (ref 8.9–10.3)
Chloride: 107 mmol/L (ref 98–111)
Creatinine, Ser: 2.28 mg/dL — ABNORMAL HIGH (ref 0.44–1.00)
GFR calc Af Amer: 25 mL/min — ABNORMAL LOW (ref >60)
GFR calc non Af Amer: 22 mL/min — ABNORMAL LOW (ref >60)
Glucose, Bld: 243 mg/dL — ABNORMAL HIGH (ref 70–99)
Potassium: 3.4 mmol/L — ABNORMAL LOW (ref 3.5–5.1)
Sodium: 139 mmol/L (ref 135–145)
Total Bilirubin: 0.7 mg/dL (ref 0.3–1.2)
Total Protein: 6.4 g/dL — ABNORMAL LOW (ref 6.5–8.1)

## 2018-08-22 LAB — URINALYSIS, COMPLETE (UACMP) WITH MICROSCOPIC
Bacteria, UA: NONE SEEN
Bilirubin Urine: NEGATIVE
Glucose, UA: 50 mg/dL — AB
Ketones, ur: NEGATIVE mg/dL
Leukocytes, UA: NEGATIVE
Nitrite: POSITIVE — AB
Protein, ur: >=300 mg/dL — AB
Specific Gravity, Urine: 1.01 (ref 1.005–1.030)
pH: 6 (ref 5.0–8.0)

## 2018-08-22 LAB — ACETAMINOPHEN LEVEL: Acetaminophen (Tylenol), Serum: <10 ug/mL — ABNORMAL LOW (ref 10–30)

## 2018-08-22 LAB — CBC WITH DIFFERENTIAL/PLATELET
Abs Immature Granulocytes: 0.05 10*3/uL (ref 0.00–0.07)
Abs Immature Granulocytes: 0.02 10*3/uL (ref 0.00–0.07)
Basophils Absolute: 0.1 10*3/uL (ref 0.0–0.1)
Basophils Absolute: 0.1 10*3/uL (ref 0.0–0.1)
Basophils Relative: 1 %
Basophils Relative: 1 %
Eosinophils Absolute: 0.3 10*3/uL (ref 0.0–0.5)
Eosinophils Absolute: 0.3 10*3/uL (ref 0.0–0.5)
Eosinophils Relative: 3 %
Eosinophils Relative: 3 %
HCT: 25.9 % — ABNORMAL LOW (ref 36.0–46.0)
HCT: 26.4 % — ABNORMAL LOW (ref 36.0–46.0)
Hemoglobin: 8.9 g/dL — ABNORMAL LOW (ref 12.0–15.0)
Hemoglobin: 8.7 g/dL — ABNORMAL LOW (ref 12.0–15.0)
Immature Granulocytes: 1 %
Immature Granulocytes: 0 %
Lymphocytes Relative: 18 %
Lymphocytes Relative: 15 %
Lymphs Abs: 1.8 10*3/uL (ref 0.7–4.0)
Lymphs Abs: 1.2 10*3/uL (ref 0.7–4.0)
MCH: 32.6 pg (ref 26.0–34.0)
MCH: 31.5 pg (ref 26.0–34.0)
MCHC: 34.4 g/dL (ref 30.0–36.0)
MCHC: 33 g/dL (ref 30.0–36.0)
MCV: 94.9 fL (ref 80.0–100.0)
MCV: 95.7 fL (ref 80.0–100.0)
Monocytes Absolute: 0.8 10*3/uL (ref 0.1–1.0)
Monocytes Absolute: 0.6 10*3/uL (ref 0.1–1.0)
Monocytes Relative: 8 %
Monocytes Relative: 8 %
Neutro Abs: 6.7 10*3/uL (ref 1.7–7.7)
Neutro Abs: 5.8 10*3/uL (ref 1.7–7.7)
Neutrophils Relative %: 69 %
Neutrophils Relative %: 73 %
Platelets: 192 10*3/uL (ref 150–400)
Platelets: 184 10*3/uL (ref 150–400)
RBC: 2.73 MIL/uL — ABNORMAL LOW (ref 3.87–5.11)
RBC: 2.76 MIL/uL — ABNORMAL LOW (ref 3.87–5.11)
RDW: 13.4 % (ref 11.5–15.5)
RDW: 14 % (ref 11.5–15.5)
WBC: 9.6 10*3/uL (ref 4.0–10.5)
WBC: 8 10*3/uL (ref 4.0–10.5)
nRBC: 0 % (ref 0.0–0.2)
nRBC: 0 % (ref 0.0–0.2)

## 2018-08-22 LAB — BASIC METABOLIC PANEL
Anion gap: 8 (ref 5–15)
BUN: 31 mg/dL — ABNORMAL HIGH (ref 8–23)
CO2: 24 mmol/L (ref 22–32)
Calcium: 9 mg/dL (ref 8.9–10.3)
Chloride: 107 mmol/L (ref 98–111)
Creatinine, Ser: 2.26 mg/dL — ABNORMAL HIGH (ref 0.44–1.00)
GFR calc Af Amer: 25 mL/min — ABNORMAL LOW (ref >60)
GFR calc non Af Amer: 22 mL/min — ABNORMAL LOW (ref >60)
Glucose, Bld: 167 mg/dL — ABNORMAL HIGH (ref 70–99)
Potassium: 3.1 mmol/L — ABNORMAL LOW (ref 3.5–5.1)
Sodium: 139 mmol/L (ref 135–145)

## 2018-08-22 LAB — RAPID URINE DRUG SCREEN, HOSP PERFORMED
Amphetamines: NOT DETECTED
Barbiturates: NOT DETECTED
Benzodiazepines: NOT DETECTED
Cocaine: NOT DETECTED
Opiates: POSITIVE — AB
Tetrahydrocannabinol: NOT DETECTED

## 2018-08-22 LAB — ETHANOL: Alcohol, Ethyl (B): <10 mg/dL (ref <10)

## 2018-08-22 LAB — AMMONIA: Ammonia: 15 umol/L (ref 9–35)

## 2018-08-22 MED ORDER — CIPROFLOXACIN HCL 250 MG PO TABS
500.0000 mg | ORAL_TABLET | ORAL | Status: DC
  Administered 2018-08-22 – 2018-08-23 (×2): 500 mg via ORAL
  Filled 2018-08-22 (×2): qty 2

## 2018-08-22 MED ORDER — LORAZEPAM 2 MG/ML IJ SOLN
1.0000 mg | Freq: Once | INTRAMUSCULAR | Status: AC
  Administered 2018-08-22: 1 mg via INTRAVENOUS
  Filled 2018-08-22: qty 1

## 2018-08-22 NOTE — ED Notes (Signed)
Patient transported to CT 

## 2018-08-22 NOTE — ED Notes (Signed)
[facility] called about TTS consult.  "It will be several hours before they can get to her, because they had several walk-ins".  Nurse informed.

## 2018-08-22 NOTE — BH Assessment (Addendum)
Tele Assessment Note   Patient Name: [name] MRN: [id number] Referring Physician: Sedonia Small Location of Patient: [facility] Location of Provider: [place]  [name] is an 68 y.o. female.  The pt came in after telling staff at her nursing home she wants to hurt herself and others.  The pt denies SI and HI currently.  She pt isn't currently oriented.  She was unable to state the month year, president or state where she is.  The pt stated she lives at home by herself and denies living in a nursing home.  The pt was diagnosed yesterday with a UTI and was in the ED yesterday also.  The pt appears to answer questions she knows the answers to and will be silent when she doesn't know an answer such as the year.    Pt is dressed in hospital gown. She is alert. Pt speaks in a clear tone, at low volume and slow pace. Eye contact is good. Pt's mood is flat.There is no indication Pt is currently responding to internal stimuli or experiencing delusional thought content.?Pt was cooperative throughout assessment.    Diagnosis: F23 Brief psychotic disorder   Past Medical History:  Past Medical History:  Diagnosis Date  . Anxiety   . Asthma   . Back pain   . Barrett esophagus   . CHF (congestive heart failure) ([place])   . CKD (chronic kidney disease) stage 3, GFR 30-59 ml/min (HCC)   . COPD (chronic obstructive pulmonary disease) ([place])   . Depression   . Diastolic heart failure ([place])   . Essential hypertension   . Fibromyalgia   . GERD (gastroesophageal reflux disease)   . Gout   . History of pericarditis   . Hyperlipidemia   . Hypothyroid   . Major depressive disorder   . Memory loss   . Nephrolithiasis    2008  . Obesity hypoventilation syndrome (HCC)    CPAP  . Obstructive sleep apnea   . PE (pulmonary embolism)    2010  . Peripheral neuropathy   . PTSD (post-traumatic stress disorder)   . Rheumatoid arthritis ([place])   . Schizophrenia ([place])   . Steroid  dependence   . Type 2 diabetes mellitus ([place])   . Vitamin D deficiency     Past Surgical History:  Procedure Laterality Date  . APPENDECTOMY    . CHOLECYSTECTOMY    . COLONOSCOPY     benign polyps (12/2000), repeat colonoscopy performed in 2007  . ENDOMETRIAL BIOPSY     10/03 benign columnar mucosa (limited material)  . KIDNEY SURGERY      Family History:  Family History  Problem Relation Age of Onset  . Bone cancer Mother   . Hypertension Mother   . Heart disease Mother   . COPD Father   . Heart disease Father   . Stroke Father     Social History:  reports that she quit smoking about 46 years ago. Her smoking use included cigarettes. She has never used smokeless tobacco. She reports that she does not drink alcohol or use drugs.  Additional Social History:  Alcohol / Drug Use Pain Medications: See MAr Prescriptions: See MAR Over the Counter: See MAR History of alcohol / drug use?: No history of alcohol / drug abuse Longest period of sobriety (when/how long): NA  CIWA: CIWA-Ar BP: 106/64 Pulse Rate: 85 COWS:    Allergies:  Allergies  Allergen Reactions  . Benztropine Mesylate Other (See Comments)    Alopecia  and white spots on eyes  . Codeine Swelling, Palpitations and Other (See Comments)    Mouth Swelling and Tachycardia   . Diazepam Other (See Comments)    COMA  . Diphenhydramine Hcl Anxiety, Palpitations and Other (See Comments)    Tachycardia and anxiousness  . Penicillins Other (See Comments)    REACTION: ulcers in mouth, swelling, throat swelling  Has patient had a PCN reaction causing immediate rash, facial/tongue/throat swelling, SOB or lightheadedness with hypotension: YES Has patient had a PCN reaction causing severe rash involving Mucus membranes or skin necrosis: Unknown Has patient had a PCN reaction that required hospitalization Unknown Has patient had a PCN reaction occurring within the last 10 years: unknown If all of the above answers are  "NO", then may proceed with Cephalospori  . Sulfonamide Derivatives Rash    Blisters, also  . Thioridazine Hcl Swelling  . Benztropine Other (See Comments)    "Alopecia and white spots on eyes"  . Penicillin G Swelling and Other (See Comments)    Ulcers in mouth & tongue became swollen Has patient had a PCN reaction causing immediate rash, facial/tongue/throat swelling, SOB or lightheadedness with hypotension: Yes Has patient had a PCN reaction causing severe rash involving mucus membranes or skin necrosis: Unk Has patient had a PCN reaction that required hospitalization: Unk Has patient had a PCN reaction occurring within the last 10 years: Unk If all of the above answers are "NO", then may proceed with Cephalosporin use.   [name] Kitchen Lisinopril Cough  . Sulfamethoxazole Rash    Home Medications: (Not in a hospital admission)   OB/GYN Status:  No LMP recorded. Patient is postmenopausal.  General Assessment Data Assessment unable to be completed: Yes Reason for not completing assessment: 4 walk-ins at [facility] Location of Assessment: [facility] ED TTS Assessment: In system Is this a Tele or Face-to-Face Assessment?: Tele Assessment Is this an Initial Assessment or a Re-assessment for this encounter?: Initial Assessment Patient Accompanied by:: N/A Language Other than English: No Living Arrangements: Other (Comment)(nursing home) What gender do you identify as?: Female Marital status: Single Maiden name: NA Pregnancy Status: No Living Arrangements: Other (Comment)(nursing home) Can pt return to current living arrangement?: Yes Admission Status: Voluntary Is patient capable of signing voluntary admission?: Yes Referral Source: Self/Family/Friend Insurance type: Medicare     Crisis Care Plan Living Arrangements: Other (Comment)(nursing home) Legal Guardian: Other:(Self) Name of Psychiatrist: [place] Name of Therapist: UTA  Education Status Is patient currently in school?: No Is the patient  employed, unemployed or receiving disability?: Unemployed  Risk to self with the past 6 months Suicidal Ideation: No Has patient been a risk to self within the past 6 months prior to admission? : No Suicidal Intent: No Has patient had any suicidal intent within the past 6 months prior to admission? : No Is patient at risk for suicide?: No Suicidal Plan?: No Has patient had any suicidal plan within the past 6 months prior to admission? : No Access to Means: No What has been your use of drugs/alcohol within the last 12 months?: none Previous Attempts/Gestures: No How many times?: 0 Other Self Harm Risks: UTA Triggers for Past Attempts: Unknown Intentional Self Injurious Behavior: None Family Suicide History: Unknown Recent stressful life event(s): Other (Comment)(UTA) Persecutory voices/beliefs?: No Depression: No  Risk to Others within the past 6 months Homicidal Ideation: No Does patient have any lifetime risk of violence toward others beyond the six months prior to admission? : No Thoughts of Harm to Others:  No Current Homicidal Intent: No Current Homicidal Plan: No Access to Homicidal Means: No Identified Victim: pt denies History of harm to others?: No Assessment of Violence: None Noted Violent Behavior Description: none Does patient have access to weapons?: No Criminal Charges Pending?: No Does patient have a court date: No Is patient on probation?: No  Psychosis Hallucinations: None noted Delusions: None noted  Mental Status Report Appearance/Hygiene: Unremarkable, In scrubs Eye Contact: Fair Motor Activity: Unable to assess Speech: Soft, Other (Comment)(delayed) Level of Consciousness: Alert Mood: Preoccupied Affect: Flat Anxiety Level: None Thought Processes: Relevant Judgement: Impaired Orientation: Not oriented Obsessive Compulsive Thoughts/Behaviors: None  Cognitive Functioning Concentration: Normal Memory: Recent Intact, Remote Intact Is patient  IDD: No Insight: Poor Impulse Control: Poor Appetite: (UTA) Have you had any weight changes? : (UTA) Sleep: Unable to Assess Vegetative Symptoms: Unable to Assess  ADLScreening [facility] Assessment Services) Patient's cognitive ability adequate to safely complete daily activities?: No Patient able to express need for assistance with ADLs?: Yes  Prior Inpatient Therapy Prior Inpatient Therapy: No  Prior Outpatient Therapy Prior Outpatient Therapy: No Does patient have an ACCT team?: No Does patient have Intensive In-House Services?  : No Does patient have [facility] services? : No Does patient have P4CC services?: No  ADL Screening (condition at time of admission) Patient's cognitive ability adequate to safely complete daily activities?: No Patient able to express need for assistance with ADLs?: Yes       Abuse/Neglect Assessment (Assessment to be complete while patient is alone) Abuse/Neglect Assessment Can Be Completed: Yes Physical Abuse: Denies Verbal Abuse: Denies Sexual Abuse: Denies Exploitation of patient/patient's resources: Denies Self-Neglect: Denies Values / Beliefs Cultural Requests During Hospitalization: None Spiritual Requests During Hospitalization: None Consults Spiritual Care Consult Needed: No Social Work Consult Needed: No Regulatory affairs officer (For Healthcare) Does Patient Have a Medical Advance Directive?: No          Disposition:  Disposition Initial Assessment Completed for this Encounter: Yes  NP Jefferson Fuel recommends the pt be observed and reassessed. RN Lonn Georgia was made aware of the recommendation.  This service was provided via telemedicine using a 2-way, interactive audio and video technology.  Names of all persons participating in this telemedicine service and their role in this encounter. Name: [name] Role: Pt  Name: Virgina Organ Role: TTS  Name:  Role:   Name:  Role:     Enzo Montgomery 08/22/2018 7:00 PM

## 2018-08-22 NOTE — ED Provider Notes (Signed)
[facility] EMERGENCY DEPARTMENT Provider Note   CSN: [id number] Arrival date & time: 08/22/18  0100     History   Chief Complaint Chief Complaint  Patient presents with  . Altered Mental Status    HPI [name] is a 68 y.o. female.  Patient is a 68 year old female with past medical history of CHF, COPD, chronic renal insufficiency, fibromyalgia, depression, schizophrenia, PTSD.  She presents today for evaluation of altered mental status.  She is a resident of an extended care facility and sent here by staff.  She was seen earlier today and diagnosed with a urinary tract infection at [facility].  She was only back at nursing home for 2 hours before being sent here.  The patient apparently became nonverbal and unresponsive to the nursing staff.  Patient adds no additional history.  She stares straight ahead and will not respond to questions.  According to the chart, she has had multiple similar episodes in the past.  The history is provided by the patient.  Altered Mental Status  Presenting symptoms: partial responsiveness   Most recent episode:  Today Episode history:  Continuous Timing:  Constant Progression:  Unchanged Chronicity:  Recurrent   Past Medical History:  Diagnosis Date  . Anxiety   . Asthma   . Back pain   . Barrett esophagus   . CHF (congestive heart failure) ([place])   . CKD (chronic kidney disease) stage 3, GFR 30-59 ml/min (HCC)   . COPD (chronic obstructive pulmonary disease) ([place])   . Depression   . Diastolic heart failure ([place])   . Essential hypertension   . Fibromyalgia   . GERD (gastroesophageal reflux disease)   . Gout   . History of pericarditis   . Hyperlipidemia   . Hypothyroid   . Major depressive disorder   . Memory loss   . Nephrolithiasis    2008  . Obesity hypoventilation syndrome (HCC)    CPAP  . Obstructive sleep apnea   . PE (pulmonary embolism)    2010  . Peripheral neuropathy   . PTSD (post-traumatic stress  disorder)   . Rheumatoid arthritis ([place])   . Schizophrenia ([place])   . Steroid dependence   . Type 2 diabetes mellitus ([place])   . Vitamin D deficiency     Patient Active Problem List   Diagnosis Date Noted  . Respiratory failure ([place]) 08/16/2018  . Seizure ([place]) 08/16/2018  . Acute encephalopathy 07/27/2018  . Endotracheally intubated 07/27/2018  . Hypothyroidism 07/27/2018  . Pleuritic chest pain 07/14/2018  . Moderate persistent asthma without complication   . Type 2 diabetes mellitus ([place]) 04/19/2018  . Acute kidney injury superimposed on chronic kidney disease ([place]) 01/13/2018  . Altered mental status   . Encephalopathy 10/11/2016  . Chest pain 08/12/2016  . CKD (chronic kidney disease), stage IV ([place]) 08/12/2016  . History of pulmonary embolus (PE) 08/12/2016  . Obesity, Class III, BMI 40-49.9 (morbid obesity) ([place]) 08/12/2016  . RBBB 08/12/2016  . Positive D dimer 08/12/2016  . CVA (cerebral infarction) 11/10/2015  . Physical deconditioning 07/01/2011  . Weakness 07/01/2011  . Diabetic foot ulcer ([place]) 05/19/2011  . Polypharmacy 02/09/2011  . BACK PAIN WITH RADICULOPATHY 05/25/2010  . Type 2 diabetes mellitus with diabetic polyneuropathy, without long-term current use of insulin ([place]) 08/18/2009  . Chronic diastolic heart failure ([place]) 04/25/2009  . Normocytic anemia 04/14/2009  . Sleep apnea 12/11/2008  . Diabetic peripheral neuropathy ([place]) 05/04/2007  . HYPERCHOLESTEROLEMIA 10/13/2006  . Gout, unspecified 10/13/2006  .  HYPOKALEMIA 10/13/2006  . Schizophrenia ([place]) 10/13/2006  . Depression 10/13/2006  . Essential hypertension 10/13/2006  . Venous (peripheral) insufficiency 10/13/2006  . RHINITIS, ALLERGIC 10/13/2006  . Asthma 10/13/2006  . Reflux esophagitis 10/13/2006  . OSTEOARTHRITIS, MULTI SITES 10/13/2006  . INCONTINENCE, URGE 10/13/2006    Past Surgical History:  Procedure Laterality Date  . APPENDECTOMY    . CHOLECYSTECTOMY    . COLONOSCOPY      benign polyps (12/2000), repeat colonoscopy performed in 2007  . ENDOMETRIAL BIOPSY     10/03 benign columnar mucosa (limited material)  . KIDNEY SURGERY       OB History   No obstetric history on file.      Home Medications    Prior to Admission medications   Medication Sig Start Date End Date Taking? Authorizing Provider  acetaminophen (TYLENOL) 325 MG tablet Take 325 mg by mouth 2 (two) times daily.     [provider]  albuterol (PROVENTIL) (2.5 MG/3ML) 0.083% nebulizer solution Take 2.5 mg by nebulization every 6 (six) hours as needed for wheezing.     [provider]  albuterol (VENTOLIN HFA) 108 (90 Base) MCG/ACT inhaler Inhale 1-2 puffs into the lungs every 6 (six) hours as needed for wheezing or shortness of breath.    [provider]  amLODipine (NORVASC) 5 MG tablet Take 1 tablet (5 mg total) by mouth daily. 07/31/18 07/31/19  GhimireHenreitta Leber, MD  aspirin 81 MG tablet Take 81 mg by mouth daily.    [provider]  atorvastatin (LIPITOR) 40 MG tablet Take 40 mg by mouth at bedtime.     [provider]  carvedilol (COREG) 6.25 MG tablet Take 1 tablet (6.25 mg total) by mouth 2 (two) times daily with a meal. 08/18/18   Lavina Hamman, MD  Cholecalciferol (VITAMIN D3) 50 MCG (2000 UT) TABS Take 4,000 Units by mouth daily.    [provider]  docusate sodium (COLACE) 100 MG capsule Take 100 mg by mouth daily.     [provider]  EPINEPHrine (EPI-PEN) 0.3 mg/0.3 mL DEVI Inject 0.3 mg into the muscle once as needed (for bee stings, then go to the ED immediately after using).     [provider]  glipiZIDE (GLUCOTROL XL) 5 MG 24 hr tablet Take 5 mg by mouth 2 (two) times daily.     [provider]  Glycerin, Laxative, (FLEET LIQUID GLYCERIN SUPP RE) Place 1 suppository rectally daily as needed (for constipation).    [provider]  haloperidol (HALDOL) 0.5 MG tablet Take 1 tablet (0.5 mg total)  by mouth 2 (two) times daily. 07/31/18   Ghimire, Henreitta Leber, MD  hydrocortisone 2.5 % ointment Apply 1 application topically daily as needed (itchy skin).    [provider]  insulin degludec (TRESIBA FLEXTOUCH) 100 UNIT/ML SOPN FlexTouch Pen Inject 20 Units into the skin at bedtime.    [provider]  levETIRAcetam (KEPPRA) 500 MG tablet Take 1 tablet (500 mg total) by mouth 2 (two) times daily. 08/18/18   Lavina Hamman, MD  levothyroxine (SYNTHROID, LEVOTHROID) 25 MCG tablet Take 1 tablet (25 mcg total) by mouth daily at 6 (six) AM. 08/19/18   Lavina Hamman, MD  linagliptin (TRADJENTA) 5 MG TABS tablet Take 5 mg by mouth daily.    [provider]  loperamide (IMODIUM) 2 MG capsule Take 2 mg by mouth 3 (three) times daily as needed (for diarrhea).     [provider]  montelukast (SINGULAIR) 10 MG tablet Take 10 mg by mouth at bedtime.    [provider]  Multiple Vitamin (DAILY VITE PO) Take 1 tablet by mouth daily.    [provider]  omeprazole (PRILOSEC) 20 MG capsule Take 20 mg by mouth daily.     [provider]  OVER THE COUNTER MEDICATION CPAP    [provider]  polyethylene glycol powder (GLYCOLAX/MIRALAX) powder Take 17 g by mouth daily as needed (for constipation).    [provider]  pyridOXINE (VITAMIN B-6) 100 MG tablet Take 200 mg by mouth 2 (two) times daily.    [provider]  spironolactone (ALDACTONE) 25 MG tablet Take 1 tablet (25 mg total) by mouth daily. 04/12/18   Mesner, Corene Cornea, MD  torsemide (DEMADEX) 20 MG tablet Take 60 mg alternating with 40 mg daily. Patient taking differently: Take 40-60 mg by mouth See admin instructions. 60 mg by mouth every other day: alternating with 40 mg by mouth every other day 07/26/18   Erma Heritage, PA-C    Family History Family History  Problem Relation Age of Onset  . Bone cancer Mother   . Hypertension Mother   . Heart disease Mother     . COPD Father   . Heart disease Father   . Stroke Father     Social History Social History   Tobacco Use  . Smoking status: Former Smoker    Types: Cigarettes    Last attempt to quit: 06/23/1972    Years since quitting: 46.1  . Smokeless tobacco: Never Used  Substance Use Topics  . Alcohol use: No  . Drug use: No     Allergies   Benztropine mesylate; Codeine; Diazepam; Diphenhydramine hcl; Penicillins; Sulfonamide derivatives; Thioridazine hcl; Benztropine; Penicillin g; Lisinopril; and Sulfamethoxazole   Review of Systems Review of Systems  Unable to perform ROS: Acuity of condition     Physical Exam Updated Vital Signs Ht 5\' 3"  (1.6 m)   Wt 101.6 kg   BMI 39.68 kg/m   Physical Exam Vitals signs and nursing note reviewed.  Constitutional:      General: She is not in acute distress.    Appearance: She is well-developed. She is not diaphoretic.     Comments: Patient is awake and staring straight ahead.  She will not answer questions.  HENT:     Head: Normocephalic and atraumatic.  Neck:     Musculoskeletal: Normal range of motion and neck supple.  Cardiovascular:     Rate and Rhythm: Normal rate and regular rhythm.     Heart sounds: No murmur. No friction rub. No gallop.   Pulmonary:     Effort: Pulmonary effort is normal. No respiratory distress.     Breath sounds: Normal breath sounds. No wheezing.  Abdominal:     General: Bowel sounds are normal. There is no distension.     Palpations: Abdomen is soft.     Tenderness: There is no abdominal tenderness.  Musculoskeletal: Normal range of motion.  Skin:    General: Skin is warm and dry.  Neurological:     Comments: Patient with eyes open staring straight ahead.  She will not respond to questions verbally, but does move her extremities to command.      ED Treatments / Results  Labs (all labs ordered are listed, but only abnormal results are displayed) Labs Reviewed  BASIC METABOLIC PANEL  CBC WITH  DIFFERENTIAL/PLATELET  AMMONIA    EKG None  Radiology No results found.  Procedures Procedures (including critical care time)  Medications Ordered in ED Medications  LORazepam (ATIVAN) injection 1 mg (has no administration in time range)     Initial Impression / Assessment and Plan / ED Course  I have reviewed the triage vital signs and the nursing notes.  Pertinent labs & imaging results that were available during my care of the patient were reviewed by me and considered in my medical decision making (see chart for details).  Patient brought here by EMS from her extended care facility for evaluation of altered mental status.  She was not responding to her providers at this facility.  When she arrived here she was staring straight ahead and not following commands.  She would withdrawal to noxious stimuli when the IV was started.  She was given IV Ativan for presumed seizure.  Before the Ativan was given, she began to wake up and was able to follow commands.  Her work-up shows no abnormality on her head CT and laboratory studies are essentially unremarkable.  This patient has had multiple similar episodes to this in the past, the etiology of which I am uncertain.  However, she is now ambulatory, talking, and appears back to her baseline.  I see no indication for admission and she will be returned to her extended care facility.  Final Clinical Impressions(s) / ED Diagnoses   Final diagnoses:  None    ED Discharge Orders    None       Veryl Speak, MD 08/22/18 [phone]

## 2018-08-22 NOTE — ED Notes (Signed)
Tolerated dinner well with assistance.

## 2018-08-22 NOTE — ED Notes (Signed)
Dinner tray set up for Pt.  BH also called in for Consult.

## 2018-08-22 NOTE — ED Provider Notes (Signed)
[facility] Emergency Department Provider Note MRN:  [id number]  Arrival date & time: 08/22/18     Chief Complaint   SI   History of Present Illness   [name] is a 68 y.o. year-old female with a history of CHF, CKD, COPD, depression presenting to the ED with chief complaint of suicidal ideation.  Multiple recent ED visits for unexplained altered mental status.  Had an episode of hypoxia with altered mental status that was unexplained at the beginning of the month, required intubation.  Had extensive neurological testing with EEG and MRI of the brain which was unremarkable.  Patient seems to go in and out of moments of complete clarity.  Patient was just seen roughly 12 hours ago here in this emergency department with a similar presentation, abruptly returned to baseline, was no indication for admission and was discharged.  Sent back here from her [place] facility because she stated that she wants to harm herself and others.  Has a long history of suicide attempts, depression.  Patient advocate at bedside explains that she has been endorsing hallucinations.  Patient is currently following commands but will not speak.  I was unable to obtain an accurate HPI, PMH, or ROS due to the patient's altered mental status.  Review of Systems  A complete 10 system review of systems was obtained and all systems are negative except as noted in the HPI and PMH.   Patient's Health History    Past Medical History:  Diagnosis Date  . Anxiety   . Asthma   . Back pain   . Barrett esophagus   . CHF (congestive heart failure) ([place])   . CKD (chronic kidney disease) stage 3, GFR 30-59 ml/min (HCC)   . COPD (chronic obstructive pulmonary disease) ([place])   . Depression   . Diastolic heart failure ([place])   . Essential hypertension   . Fibromyalgia   . GERD (gastroesophageal reflux disease)   . Gout   . History of pericarditis   . Hyperlipidemia   . Hypothyroid   . Major  depressive disorder   . Memory loss   . Nephrolithiasis    2008  . Obesity hypoventilation syndrome (HCC)    CPAP  . Obstructive sleep apnea   . PE (pulmonary embolism)    2010  . Peripheral neuropathy   . PTSD (post-traumatic stress disorder)   . Rheumatoid arthritis ([place])   . Schizophrenia ([place])   . Steroid dependence   . Type 2 diabetes mellitus ([place])   . Vitamin D deficiency     Past Surgical History:  Procedure Laterality Date  . APPENDECTOMY    . CHOLECYSTECTOMY    . COLONOSCOPY     benign polyps (12/2000), repeat colonoscopy performed in 2007  . ENDOMETRIAL BIOPSY     10/03 benign columnar mucosa (limited material)  . KIDNEY SURGERY      Family History  Problem Relation Age of Onset  . Bone cancer Mother   . Hypertension Mother   . Heart disease Mother   . COPD Father   . Heart disease Father   . Stroke Father     Social History   Socioeconomic History  . Marital status: Single    Spouse name: Not on file  . Number of children: Not on file  . Years of education: Not on file  . Highest education level: Not on file  Occupational History  . Not on file  Social Needs  . Financial resource strain:  Not on file  . Food insecurity:    Worry: Not on file    Inability: Not on file  . Transportation needs:    Medical: Not on file    Non-medical: Not on file  Tobacco Use  . Smoking status: Former Smoker    Types: Cigarettes    Last attempt to quit: 06/23/1972    Years since quitting: 46.1  . Smokeless tobacco: Never Used  Substance and Sexual Activity  . Alcohol use: No  . Drug use: No  . Sexual activity: Never  Lifestyle  . Physical activity:    Days per week: Not on file    Minutes per session: Not on file  . Stress: Not on file  Relationships  . Social connections:    Talks on phone: Not on file    Gets together: Not on file    Attends religious service: Not on file    Active member of club or organization: Not on file    Attends meetings of  clubs or organizations: Not on file    Relationship status: Not on file  . Intimate partner violence:    Fear of current or ex partner: Not on file    Emotionally abused: Not on file    Physically abused: Not on file    Forced sexual activity: Not on file  Other Topics Concern  . Not on file  Social History Narrative  . Not on file     Physical Exam  Vital Signs and Nursing Notes reviewed Vitals:   08/22/18 1715 08/22/18 1730  BP:  106/64  Pulse: 85   Resp: (!) 23 19  Temp:    SpO2: 98%     CONSTITUTIONAL: Well-appearing, NAD NEURO: Not speaking, no focal deficits, follows commands EYES:  eyes equal and reactive ENT/NECK:  no LAD, no JVD CARDIO: Regular rate, well-perfused, normal S1 and S2 PULM:  CTAB no wheezing or rhonchi GI/GU:  normal bowel sounds, non-distended, non-tender MSK/SPINE:  No gross deformities, no edema SKIN:  no rash, atraumatic PSYCH: Withdrawn speech and behavior  Diagnostic and Interventional Summary    EKG Interpretation  Date/Time:  Tuesday August 22 2018 12:25:32 EST Ventricular Rate:  84 PR Interval:    QRS Duration: 175 QT Interval:  431 QTC Calculation: 510 R Axis:   -59 Text Interpretation:  Ectopic atrial rhythm Ventricular premature complex Borderline short PR interval RBBB and LAFB Left ventricular hypertrophy Confirmed by Gerlene Fee [phone]) on 08/22/2018 12:52:02 PM      Labs Reviewed  COMPREHENSIVE METABOLIC PANEL - Abnormal; Notable for the following components:      Result Value   Potassium 3.4 (*)    Glucose, Bld 243 (*)    BUN 30 (*)    Creatinine, Ser 2.28 (*)    Calcium 8.7 (*)    Total Protein 6.4 (*)    Albumin 3.2 (*)    GFR calc non Af Amer 22 (*)    GFR calc Af Amer 25 (*)    All other components within normal limits  RAPID URINE DRUG SCREEN, HOSP PERFORMED - Abnormal; Notable for the following components:   Opiates POSITIVE (*)    All other components within normal limits  CBC WITH DIFFERENTIAL/PLATELET -  Abnormal; Notable for the following components:   RBC 2.76 (*)    Hemoglobin 8.7 (*)    HCT 26.4 (*)    All other components within normal limits  ACETAMINOPHEN LEVEL - Abnormal; Notable for the following components:  Acetaminophen (Tylenol), Serum <10 (*)    All other components within normal limits  URINALYSIS, COMPLETE (UACMP) WITH MICROSCOPIC - Abnormal; Notable for the following components:   Color, Urine AMBER (*)    Glucose, UA 50 (*)    Hgb urine dipstick MODERATE (*)    Protein, ur >=300 (*)    Nitrite POSITIVE (*)    All other components within normal limits  ETHANOL    No orders to display    Medications  ciprofloxacin (CIPRO) tablet 500 mg (500 mg Oral Given 08/22/18 1543)     Procedures Critical Care  ED Course and Medical Decision Making  I have reviewed the triage vital signs and the nursing notes.  Pertinent labs & imaging results that were available during my care of the patient were reviewed by me and considered in my medical decision making (see below for details).  We will screen for medical reasons for patient's current status, such as recurrence of recent UTI, metabolic disarray.  Patient just had a CT head last night that was unremarkable.  Favoring psychiatric etiology, will consult TTS after medically cleared.  Clinical Course as of Aug 22 2237  Tue Aug 22, 2018  1913 Evaluated by TTS, patient will be kept overnight to be evaluated again in the morning.  Patient will need ciprofloxacin 500 mg every 18 hours for what seems to be a recurrent UTI.  This is the dosing necessary given her GFR.  Signed out to provider default at shift change.   [MB]    Clinical Course User Index [MB] Sedonia Small Barth Kirks, MD     Barth Kirks. Sedonia Small, [place] [email or web address]  Final Clinical Impressions(s) / ED Diagnoses     ICD-10-CM   1. Unresponsive state R41.89   2. Suicidal ideation R45.851     ED Discharge  Orders    None         Maudie Flakes, MD 08/22/18 2239

## 2018-08-22 NOTE — ED Triage Notes (Signed)
Pt resident of [place].  EMS says facility sent her out because she has stated she wants to harm herself and others.  Pt was seen at [facility] yesterday for same.  Pt c/o burning with urination.  Mobile crisis worker from [facility] was at facility and ems says they are coming to ED.  PT on home o2 at [facility] via [place]>.  Pt alert but drowsy.  EMS says that is baseline for pt.  Pt not answering questions for hospital staff at this time.

## 2018-08-22 NOTE — ED Notes (Signed)
[facility] called to set-up.

## 2018-08-22 NOTE — ED Notes (Signed)
Ambulated Pt to restroom so Pt could empty bladder. Pt ambulated with minimal assistance.

## 2018-08-22 NOTE — ED Triage Notes (Signed)
Pt brought in by [facility] for altered mental status; pt was just seen at [facility] yesterday for same complaint pt was diagnosed with hemorrhagic cystitis and given antibiotics; Staff from [facility] wanted pt sent out because she has altered loc cbg [id number]

## 2018-08-22 NOTE — ED Notes (Signed)
Pt verbally responding to commands and requests. 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 MD Notified.

## 2018-08-22 NOTE — Discharge Instructions (Addendum)
Continue medications as previously prescribed.  Follow-up with your primary doctor in the next 2 to 3 days, and return to the ER if symptoms worsen or change.

## 2018-08-23 DIAGNOSIS — R4182 Altered mental status, unspecified: Secondary | ICD-10-CM | POA: Diagnosis not present

## 2018-08-23 LAB — CBG MONITORING, ED: Glucose-Capillary: 216 mg/dL — ABNORMAL HIGH (ref 70–99)

## 2018-08-23 MED ORDER — DOXYCYCLINE HYCLATE 100 MG PO CAPS: 100.0000 mg | ORAL_CAPSULE | Freq: Two times a day (BID) | ORAL | 0 refills | Status: DC

## 2018-08-23 NOTE — ED Notes (Signed)
Pt placed in a recliner at this time watching tv.

## 2018-08-23 NOTE — ED Notes (Signed)
Report given to Nurse at [facility]. Nurse made aware of discharge.

## 2018-08-23 NOTE — Consult Note (Signed)
Telepsych Consultation   Reason for Consult:  Suicidal ideations Referring Physician:  EPD Location of Patient:  [facility] Location of Provider: [facility]  Patient Identification: Emil Klassen MRN:  [id number] Principal Diagnosis: <principal problem not specified> Diagnosis:  Active Problems:   * No active hospital problems. *   Total Time spent with patient: 15 minutes  Subjective:   Tersea Aulds is a 68 y.o. female patient admitted with suicidal ideation as reported by nursing home staff. Patient was evaluated via tele-assessment.  Divinity is awake, alert and oriented to self and place.  Currently denying suicidal or homicidal ideations.  Denies auditory or visual hallucinations.  Reports she is followed by psychiatry Dr. Deloris Ping.  Ivadell is unable to recall last follow-up visit or current medications that she is prescribed.  Brief pauses noted during assessment. chart reviewed.  Patient has been calm, quiet and cooperative during this admission. This patient  assessment was requested due to patient becoming nonverbal, however patient was engaged throughout this assessment she is reports  "  I am ready to go home."  Patient is able to articulate the street to where she lives.  Support encouragement reassurance was provided.  Case staffed with MD Dwyane Dee in treatment team.  Patient is has been psych cleared.  HPI: Per TTs counselor notes:Rosabell Haberkorn is an 68 y.o. female.  The pt came in after telling staff at her nursing home she wants to hurt herself and others.  The pt denies SI and HI currently.  She pt isn't currently oriented.  She was unable to state the month year, president or state where she is.  The pt stated she lives at home by herself and denies living in a nursing home.  The pt was diagnosed yesterday with a UTI and was in the ED yesterday also.  The pt appears to answer questions she knows the answers to and will be silent when she doesn't know an answer such as the  year.    Pt is dressed in hospital gown. She is alert. Pt speaks in a clear tone, at low volume and slow pace. Eye contact is good. Pt's mood is flat.There is no indication Pt is currently responding to internal stimuli or experiencing delusional thought content.?Pt was cooperative throughout assessment  Past Psychiatric History:   Risk to Self: Suicidal Ideation: No Suicidal Intent: No Is patient at risk for suicide?: No Suicidal Plan?: No Access to Means: No What has been your use of drugs/alcohol within the last 12 months?: none How many times?: 0 Other Self Harm Risks: UTA Triggers for Past Attempts: Unknown Intentional Self Injurious Behavior: None Risk to Others: Homicidal Ideation: No Thoughts of Harm to Others: No Current Homicidal Intent: No Current Homicidal Plan: No Access to Homicidal Means: No Identified Victim: pt denies History of harm to others?: No Assessment of Violence: None Noted Violent Behavior Description: none Does patient have access to weapons?: No Criminal Charges Pending?: No Does patient have a court date: No Prior Inpatient Therapy: Prior Inpatient Therapy: No Prior Outpatient Therapy: Prior Outpatient Therapy: No Does patient have an ACCT team?: No Does patient have Intensive In-House Services?  : No Does patient have [facility] services? : No Does patient have P4[facility] services?: No  Past Medical History:  Past Medical History:  Diagnosis Date  . Anxiety   . Asthma   . Back pain   . Barrett esophagus   . CHF (congestive heart failure) ([place])   . CKD (chronic kidney disease) stage  3, GFR 30-59 ml/min (HCC)   . COPD (chronic obstructive pulmonary disease) ([place])   . Depression   . Diastolic heart failure ([place])   . Essential hypertension   . Fibromyalgia   . GERD (gastroesophageal reflux disease)   . Gout   . History of pericarditis   . Hyperlipidemia   . Hypothyroid   . Major depressive disorder   . Memory loss   . Nephrolithiasis    2008   . Obesity hypoventilation syndrome (HCC)    CPAP  . Obstructive sleep apnea   . PE (pulmonary embolism)    2010  . Peripheral neuropathy   . PTSD (post-traumatic stress disorder)   . Rheumatoid arthritis ([place])   . Schizophrenia ([place])   . Steroid dependence   . Type 2 diabetes mellitus ([place])   . Vitamin D deficiency     Past Surgical History:  Procedure Laterality Date  . APPENDECTOMY    . CHOLECYSTECTOMY    . COLONOSCOPY     benign polyps (12/2000), repeat colonoscopy performed in 2007  . ENDOMETRIAL BIOPSY     10/03 benign columnar mucosa (limited material)  . KIDNEY SURGERY     Family History:  Family History  Problem Relation Age of Onset  . Bone cancer Mother   . Hypertension Mother   . Heart disease Mother   . COPD Father   . Heart disease Father   . Stroke Father    Family Psychiatric  History: Social History:  Social History   Substance and Sexual Activity  Alcohol Use No     Social History   Substance and Sexual Activity  Drug Use No    Social History   Socioeconomic History  . Marital status: Single    Spouse name: Not on file  . Number of children: Not on file  . Years of education: Not on file  . Highest education level: Not on file  Occupational History  . Not on file  Social Needs  . Financial resource strain: Not on file  . Food insecurity:    Worry: Not on file    Inability: Not on file  . Transportation needs:    Medical: Not on file    Non-medical: Not on file  Tobacco Use  . Smoking status: Former Smoker    Types: Cigarettes    Last attempt to quit: 06/23/1972    Years since quitting: 46.1  . Smokeless tobacco: Never Used  Substance and Sexual Activity  . Alcohol use: No  . Drug use: No  . Sexual activity: Never  Lifestyle  . Physical activity:    Days per week: Not on file    Minutes per session: Not on file  . Stress: Not on file  Relationships  . Social connections:    Talks on phone: Not on file    Gets together:  Not on file    Attends religious service: Not on file    Active member of club or organization: Not on file    Attends meetings of clubs or organizations: Not on file    Relationship status: Not on file  Other Topics Concern  . Not on file  Social History Narrative  . Not on file   Additional Social History:    Allergies:   Allergies  Allergen Reactions  . Benztropine Mesylate Other (See Comments)    Alopecia and white spots on eyes  . Codeine Swelling, Palpitations and Other (See Comments)    Mouth Swelling and Tachycardia   .  Diazepam Other (See Comments)    COMA  . Diphenhydramine Hcl Anxiety, Palpitations and Other (See Comments)    Tachycardia and anxiousness  . Penicillins Other (See Comments)    REACTION: ulcers in mouth, swelling, throat swelling  Has patient had a PCN reaction causing immediate rash, facial/tongue/throat swelling, SOB or lightheadedness with hypotension: YES Has patient had a PCN reaction causing severe rash involving Mucus membranes or skin necrosis: Unknown Has patient had a PCN reaction that required hospitalization Unknown Has patient had a PCN reaction occurring within the last 10 years: unknown If all of the above answers are "NO", then may proceed with Cephalospori  . Sulfonamide Derivatives Rash    Blisters, also  . Thioridazine Hcl Swelling  . Benztropine Other (See Comments)    "Alopecia and white spots on eyes"  . Penicillin G Swelling and Other (See Comments)    Ulcers in mouth & tongue became swollen Has patient had a PCN reaction causing immediate rash, facial/tongue/throat swelling, SOB or lightheadedness with hypotension: Yes Has patient had a PCN reaction causing severe rash involving mucus membranes or skin necrosis: Unk Has patient had a PCN reaction that required hospitalization: Unk Has patient had a PCN reaction occurring within the last 10 years: Unk If all of the above answers are "NO", then may proceed with Cephalosporin  use.   [name] Kitchen Lisinopril Cough  . Sulfamethoxazole Rash    Labs:  Results for orders placed or performed during the hospital encounter of 08/22/18 (from the past 48 hour(s))  Urine rapid drug screen (hosp performed)     Status: Abnormal   Collection Time: 08/22/18 12:14 PM  Result Value Ref Range   Opiates POSITIVE (A) NONE DETECTED   Cocaine NONE DETECTED NONE DETECTED   Benzodiazepines NONE DETECTED NONE DETECTED   Amphetamines NONE DETECTED NONE DETECTED   Tetrahydrocannabinol NONE DETECTED NONE DETECTED   Barbiturates NONE DETECTED NONE DETECTED    Comment: (NOTE) DRUG SCREEN FOR MEDICAL PURPOSES ONLY.  IF CONFIRMATION IS NEEDED FOR ANY PURPOSE, NOTIFY LAB WITHIN 5 DAYS. LOWEST DETECTABLE LIMITS FOR URINE DRUG SCREEN Drug Class                     Cutoff (ng/mL) Amphetamine and metabolites    1000 Barbiturate and metabolites    200 Benzodiazepine                 161 Tricyclics and metabolites     300 Opiates and metabolites        300 Cocaine and metabolites        300 THC                            50 Performed at [facility], [address], [place], [place] [address]   Urinalysis, Complete w Microscopic     Status: Abnormal   Collection Time: 08/22/18 12:15 PM  Result Value Ref Range   Color, Urine AMBER (A) YELLOW    Comment: BIOCHEMICALS MAY BE AFFECTED BY COLOR   APPearance CLEAR CLEAR   Specific Gravity, Urine 1.010 1.005 - 1.030   pH 6.0 5.0 - 8.0   Glucose, UA 50 (A) NEGATIVE mg/dL   Hgb urine dipstick MODERATE (A) NEGATIVE   Bilirubin Urine NEGATIVE NEGATIVE   Ketones, ur NEGATIVE NEGATIVE mg/dL   Protein, ur >=300 (A) NEGATIVE mg/dL   Nitrite POSITIVE (A) NEGATIVE   Leukocytes, UA NEGATIVE NEGATIVE   RBC /  HPF 0-5 0 - 5 RBC/hpf   WBC, UA 0-5 0 - 5 WBC/hpf   Bacteria, UA NONE SEEN NONE SEEN   Squamous Epithelial / LPF 0-5 0 - 5    Comment: Performed at [facility], [address], [place], [place] [address]  Comprehensive metabolic panel     Status:  Abnormal   Collection Time: 08/22/18 12:48 PM  Result Value Ref Range   Sodium 139 135 - 145 mmol/L   Potassium 3.4 (L) 3.5 - 5.1 mmol/L   Chloride 107 98 - 111 mmol/L   CO2 24 22 - 32 mmol/L   Glucose, Bld 243 (H) 70 - 99 mg/dL   BUN 30 (H) 8 - 23 mg/dL   Creatinine, Ser 2.28 (H) 0.44 - 1.00 mg/dL   Calcium 8.7 (L) 8.9 - 10.3 mg/dL   Total Protein 6.4 (L) 6.5 - 8.1 g/dL   Albumin 3.2 (L) 3.5 - 5.0 g/dL   AST 34 15 - 41 U/L   ALT 39 0 - 44 U/L   Alkaline Phosphatase 74 38 - 126 U/L   Total Bilirubin 0.7 0.3 - 1.2 mg/dL   GFR calc non Af Amer 22 (L) >60 mL/min   GFR calc Af Amer 25 (L) >60 mL/min   Anion gap 8 5 - 15    Comment: Performed at [facility], [address] [place], [place] [address]  Ethanol     Status: None   Collection Time: 08/22/18 12:48 PM  Result Value Ref Range   Alcohol, Ethyl (B) <10 <10 mg/dL    Comment: (NOTE) Lowest detectable limit for serum alcohol is 10 mg/dL. For medical purposes only. Performed at [facility], [address] [place], [place] [address]   CBC with Diff     Status: Abnormal   Collection Time: 08/22/18 12:48 PM  Result Value Ref Range   WBC 8.0 4.0 - 10.5 K/uL   RBC 2.76 (L) 3.87 - 5.11 MIL/uL   Hemoglobin 8.7 (L) 12.0 - 15.0 g/dL   HCT 26.4 (L) 36.0 - 46.0 %   MCV 95.7 80.0 - 100.0 fL   MCH 31.5 26.0 - 34.0 pg   MCHC 33.0 30.0 - 36.0 g/dL   RDW 14.0 11.5 - 15.5 %   Platelets 184 150 - 400 K/uL   nRBC 0.0 0.0 - 0.2 %   Neutrophils Relative % 73 %   Neutro Abs 5.8 1.7 - 7.7 K/uL   Lymphocytes Relative 15 %   Lymphs Abs 1.2 0.7 - 4.0 K/uL   Monocytes Relative 8 %   Monocytes Absolute 0.6 0.1 - 1.0 K/uL   Eosinophils Relative 3 %   Eosinophils Absolute 0.3 0.0 - 0.5 K/uL   Basophils Relative 1 %   Basophils Absolute 0.1 0.0 - 0.1 K/uL   Immature Granulocytes 0 %   Abs Immature Granulocytes 0.02 0.00 - 0.07 K/uL    Comment: Performed at [facility], [address], [place], [place] [address]  Acetaminophen level      Status: Abnormal   Collection Time: 08/22/18 12:48 PM  Result Value Ref Range   Acetaminophen (Tylenol), Serum <10 (L) 10 - 30 ug/mL    Comment: (NOTE) Therapeutic concentrations vary significantly. A range of 10-30 ug/mL  may be an effective concentration for many patients. However, some  are best treated at concentrations outside of this range. Acetaminophen concentrations >150 ug/mL at 4 hours after ingestion  and >50 ug/mL at 12 hours after ingestion are often associated with  toxic reactions. Performed at [facility], [address] [place], [place] [address]   CBG monitoring, ED     Status: Abnormal   Collection Time: 08/23/18  8:49 AM  Result Value Ref Range   Glucose-Capillary 216 (H) 70 - 99 mg/dL    Medications:  Current Facility-Administered Medications  Medication Dose Route Frequency Provider Last Rate Last Dose  . ciprofloxacin (CIPRO) tablet 500 mg  500 mg Oral Q18H Maudie Flakes, MD   500 mg at 08/23/18 8413   Current Outpatient Medications  Medication Sig Dispense Refill  . acetaminophen (TYLENOL) 325 MG tablet Take 325 mg by mouth 2 (two) times daily.     [name] Kitchen albuterol (PROVENTIL) (2.5 MG/3ML) 0.083% nebulizer solution Take 2.5 mg by nebulization every 6 (six) hours as needed for wheezing.     [name] Kitchen albuterol (VENTOLIN HFA) 108 (90 Base) MCG/ACT inhaler Inhale 1-2 puffs into the lungs every 6 (six) hours as needed for wheezing or shortness of breath.    [name] Kitchen amLODipine (NORVASC) 5 MG tablet Take 1 tablet (5 mg total) by mouth daily. 30 tablet 11  . aspirin 81 MG tablet Take 81 mg by mouth daily.    . carvedilol (COREG) 6.25 MG tablet Take 1 tablet (6.25 mg total) by mouth 2 (two) times daily with a meal. 60 tablet 0  . cephALEXin (KEFLEX) 500 MG capsule Take 500 mg by mouth 2 (two) times daily.    . Cholecalciferol (VITAMIN D3) 50 MCG (2000 UT) TABS Take 4,000 Units by mouth daily.    [name] Kitchen docusate sodium (COLACE) 100 MG capsule Take 100 mg by mouth daily.     [name] Kitchen  EPINEPHrine (EPI-PEN) 0.3 mg/0.3 mL DEVI Inject 0.3 mg into the muscle once as needed (for bee stings, then go to the ED immediately after using).     [name] Kitchen glipiZIDE (GLUCOTROL XL) 5 MG 24 hr tablet Take 5 mg by mouth 2 (two) times daily.     . Glycerin, Laxative, (FLEET LIQUID GLYCERIN SUPP RE) Place 1 suppository rectally daily as needed (for constipation).    . haloperidol (HALDOL) 0.5 MG tablet Take 1 tablet (0.5 mg total) by mouth 2 (two) times daily.    . hydrocortisone 2.5 % ointment Apply 1 application topically daily as needed (itchy skin).    [name] Kitchen levETIRAcetam (KEPPRA) 500 MG tablet Take 1 tablet (500 mg total) by mouth 2 (two) times daily. 60 tablet 0  . linagliptin (TRADJENTA) 5 MG TABS tablet Take 5 mg by mouth daily.    [name] Kitchen loperamide (IMODIUM) 2 MG capsule Take 2 mg by mouth 3 (three) times daily as needed (for diarrhea).     . Multiple Vitamin (DAILY VITE PO) Take 1 tablet by mouth daily.    [name] Kitchen omeprazole (PRILOSEC) 20 MG capsule Take 20 mg by mouth daily.     [name] Kitchen OVER THE COUNTER MEDICATION CPAP    . polyethylene glycol powder (GLYCOLAX/MIRALAX) powder Take 17 g by mouth daily as needed (for constipation).    . pyridOXINE (VITAMIN B-6) 100 MG tablet Take 200 mg by mouth 2 (two) times daily.    [name] Kitchen spironolactone (ALDACTONE) 25 MG tablet Take 1 tablet (25 mg total) by mouth daily. 30 tablet 0  . torsemide (DEMADEX) 20 MG tablet Take 60 mg alternating with 40 mg daily. (Patient taking differently: Take 40-60 mg by mouth See admin instructions. 60 mg by mouth every other day: alternating with 40 mg by mouth every other day) 216 tablet 3  . levothyroxine (  SYNTHROID, LEVOTHROID) 25 MCG tablet Take 1 tablet (25 mcg total) by mouth daily at 6 (six) AM. 30 tablet 0    Musculoskeletal: Strength & Muscle Tone: N/A Gait & Station: N/A tele assessment  Patient leans: N/A  Psychiatric Specialty Exam: Physical Exam  Constitutional: She appears well-developed.  Psychiatric: She has a normal mood and  affect. Her behavior is normal.    ROS  Blood pressure (!) 146/60, pulse 92, temperature 98.2 F (36.8 C), temperature source Oral, resp. rate (!) 26, height 5\' 3"  (1.6 m), SpO2 96 %.Body mass index is 39.68 kg/m.  General Appearance: Casual  Eye Contact:  Fair  Speech:  Clear and Coherent  Volume:  Normal  Mood:  Anxious  Affect:  Congruent  Thought Process:  Coherent  Orientation:  Full (Time, Place, and Person)  Thought Content:  NA  Suicidal Thoughts:  No  Homicidal Thoughts:  No  Memory:  Immediate;   Poor Recent;   Poor  Judgement:  Poor  Insight:  Fair  Psychomotor Activity:   Concentration:  Concentration: Fair  Recall:  [employer] of Knowledge:  Fair  Language:  Fair  Akathisia:  No  Handed:  Right  AIMS (if indicated):     Assets:  Communication Skills Desire for Improvement Resilience Social Support  ADL's:  Intact  Cognition:  WNL  Sleep:        Treatment Plan Summary: Daily contact with patient to assess and evaluate symptoms and progress in treatment and Medication management   NP contacted MD Zammit for discharge disposition   Disposition: No evidence of imminent risk to self or others at present.   Patient does not meet criteria for psychiatric inpatient admission. Refer to IOP. Discussed crisis plan, support from social network, calling 911, coming to the Emergency Department, and calling Suicide Hotline.  This service was provided via telemedicine using a 2-way, interactive audio and video technology.  Names of all persons participating in this telemedicine service and their role in this encounter. Name: Eddis Pingleton  Role: patient   Name: T. Lewis  Role: N.P          [place]ill [place], NP 08/23/2018 11:24 AM

## 2018-08-23 NOTE — ED Notes (Signed)
Pt moved from recliner to stretcher. Pt given a ginger ale at this time.

## 2018-08-23 NOTE — ED Notes (Signed)
Pt at bedside dressed ready for discharge sitting in chair.

## 2018-08-23 NOTE — Discharge Instructions (Addendum)
Follow-up with your doctor next week for recheck 

## 2018-08-24 DIAGNOSIS — D631 Anemia in chronic kidney disease: Secondary | ICD-10-CM | POA: Diagnosis not present

## 2018-08-24 DIAGNOSIS — I5032 Chronic diastolic (congestive) heart failure: Secondary | ICD-10-CM | POA: Diagnosis not present

## 2018-08-24 DIAGNOSIS — I130 Hypertensive heart and chronic kidney disease with heart failure and stage 1 through stage 4 chronic kidney disease, or unspecified chronic kidney disease: Secondary | ICD-10-CM | POA: Diagnosis not present

## 2018-08-24 DIAGNOSIS — E1122 Type 2 diabetes mellitus with diabetic chronic kidney disease: Secondary | ICD-10-CM | POA: Diagnosis not present

## 2018-08-24 DIAGNOSIS — N184 Chronic kidney disease, stage 4 (severe): Secondary | ICD-10-CM | POA: Diagnosis not present

## 2018-08-24 DIAGNOSIS — G9340 Encephalopathy, unspecified: Secondary | ICD-10-CM | POA: Diagnosis not present

## 2018-08-28 DIAGNOSIS — N184 Chronic kidney disease, stage 4 (severe): Secondary | ICD-10-CM | POA: Diagnosis not present

## 2018-08-28 DIAGNOSIS — G9340 Encephalopathy, unspecified: Secondary | ICD-10-CM | POA: Diagnosis not present

## 2018-08-28 DIAGNOSIS — D631 Anemia in chronic kidney disease: Secondary | ICD-10-CM | POA: Diagnosis not present

## 2018-08-28 DIAGNOSIS — I5032 Chronic diastolic (congestive) heart failure: Secondary | ICD-10-CM | POA: Diagnosis not present

## 2018-08-28 DIAGNOSIS — E1122 Type 2 diabetes mellitus with diabetic chronic kidney disease: Secondary | ICD-10-CM | POA: Diagnosis not present

## 2018-08-28 DIAGNOSIS — I130 Hypertensive heart and chronic kidney disease with heart failure and stage 1 through stage 4 chronic kidney disease, or unspecified chronic kidney disease: Secondary | ICD-10-CM | POA: Diagnosis not present

## 2018-08-29 DIAGNOSIS — E1122 Type 2 diabetes mellitus with diabetic chronic kidney disease: Secondary | ICD-10-CM | POA: Diagnosis not present

## 2018-08-29 DIAGNOSIS — D631 Anemia in chronic kidney disease: Secondary | ICD-10-CM | POA: Diagnosis not present

## 2018-08-29 DIAGNOSIS — G9340 Encephalopathy, unspecified: Secondary | ICD-10-CM | POA: Diagnosis not present

## 2018-08-29 DIAGNOSIS — I130 Hypertensive heart and chronic kidney disease with heart failure and stage 1 through stage 4 chronic kidney disease, or unspecified chronic kidney disease: Secondary | ICD-10-CM | POA: Diagnosis not present

## 2018-08-29 DIAGNOSIS — I5032 Chronic diastolic (congestive) heart failure: Secondary | ICD-10-CM | POA: Diagnosis not present

## 2018-08-29 DIAGNOSIS — N184 Chronic kidney disease, stage 4 (severe): Secondary | ICD-10-CM | POA: Diagnosis not present

## 2018-08-30 DIAGNOSIS — G9340 Encephalopathy, unspecified: Secondary | ICD-10-CM | POA: Diagnosis not present

## 2018-08-30 DIAGNOSIS — I130 Hypertensive heart and chronic kidney disease with heart failure and stage 1 through stage 4 chronic kidney disease, or unspecified chronic kidney disease: Secondary | ICD-10-CM | POA: Diagnosis not present

## 2018-08-30 DIAGNOSIS — N184 Chronic kidney disease, stage 4 (severe): Secondary | ICD-10-CM | POA: Diagnosis not present

## 2018-08-30 DIAGNOSIS — I5032 Chronic diastolic (congestive) heart failure: Secondary | ICD-10-CM | POA: Diagnosis not present

## 2018-08-30 DIAGNOSIS — E1122 Type 2 diabetes mellitus with diabetic chronic kidney disease: Secondary | ICD-10-CM | POA: Diagnosis not present

## 2018-08-30 DIAGNOSIS — D631 Anemia in chronic kidney disease: Secondary | ICD-10-CM | POA: Diagnosis not present

## 2018-09-04 DIAGNOSIS — I130 Hypertensive heart and chronic kidney disease with heart failure and stage 1 through stage 4 chronic kidney disease, or unspecified chronic kidney disease: Secondary | ICD-10-CM | POA: Diagnosis not present

## 2018-09-04 DIAGNOSIS — N184 Chronic kidney disease, stage 4 (severe): Secondary | ICD-10-CM | POA: Diagnosis not present

## 2018-09-04 DIAGNOSIS — G9340 Encephalopathy, unspecified: Secondary | ICD-10-CM | POA: Diagnosis not present

## 2018-09-04 DIAGNOSIS — D631 Anemia in chronic kidney disease: Secondary | ICD-10-CM | POA: Diagnosis not present

## 2018-09-04 DIAGNOSIS — E1122 Type 2 diabetes mellitus with diabetic chronic kidney disease: Secondary | ICD-10-CM | POA: Diagnosis not present

## 2018-09-04 DIAGNOSIS — I5032 Chronic diastolic (congestive) heart failure: Secondary | ICD-10-CM | POA: Diagnosis not present

## 2018-09-05 DIAGNOSIS — E1122 Type 2 diabetes mellitus with diabetic chronic kidney disease: Secondary | ICD-10-CM | POA: Diagnosis not present

## 2018-09-05 DIAGNOSIS — D631 Anemia in chronic kidney disease: Secondary | ICD-10-CM | POA: Diagnosis not present

## 2018-09-05 DIAGNOSIS — G9340 Encephalopathy, unspecified: Secondary | ICD-10-CM | POA: Diagnosis not present

## 2018-09-05 DIAGNOSIS — I5032 Chronic diastolic (congestive) heart failure: Secondary | ICD-10-CM | POA: Diagnosis not present

## 2018-09-05 DIAGNOSIS — I130 Hypertensive heart and chronic kidney disease with heart failure and stage 1 through stage 4 chronic kidney disease, or unspecified chronic kidney disease: Secondary | ICD-10-CM | POA: Diagnosis not present

## 2018-09-05 DIAGNOSIS — N184 Chronic kidney disease, stage 4 (severe): Secondary | ICD-10-CM | POA: Diagnosis not present

## 2018-09-06 IMAGING — CR DG CHEST 1V PORT
1 series · 1 of 1 positions shown · non-contrast
Comparison: Radiographs January 12, 2018.

CLINICAL DATA: Dizziness, hypertension.

EXAM:
PORTABLE CHEST 1 VIEW

[portable]
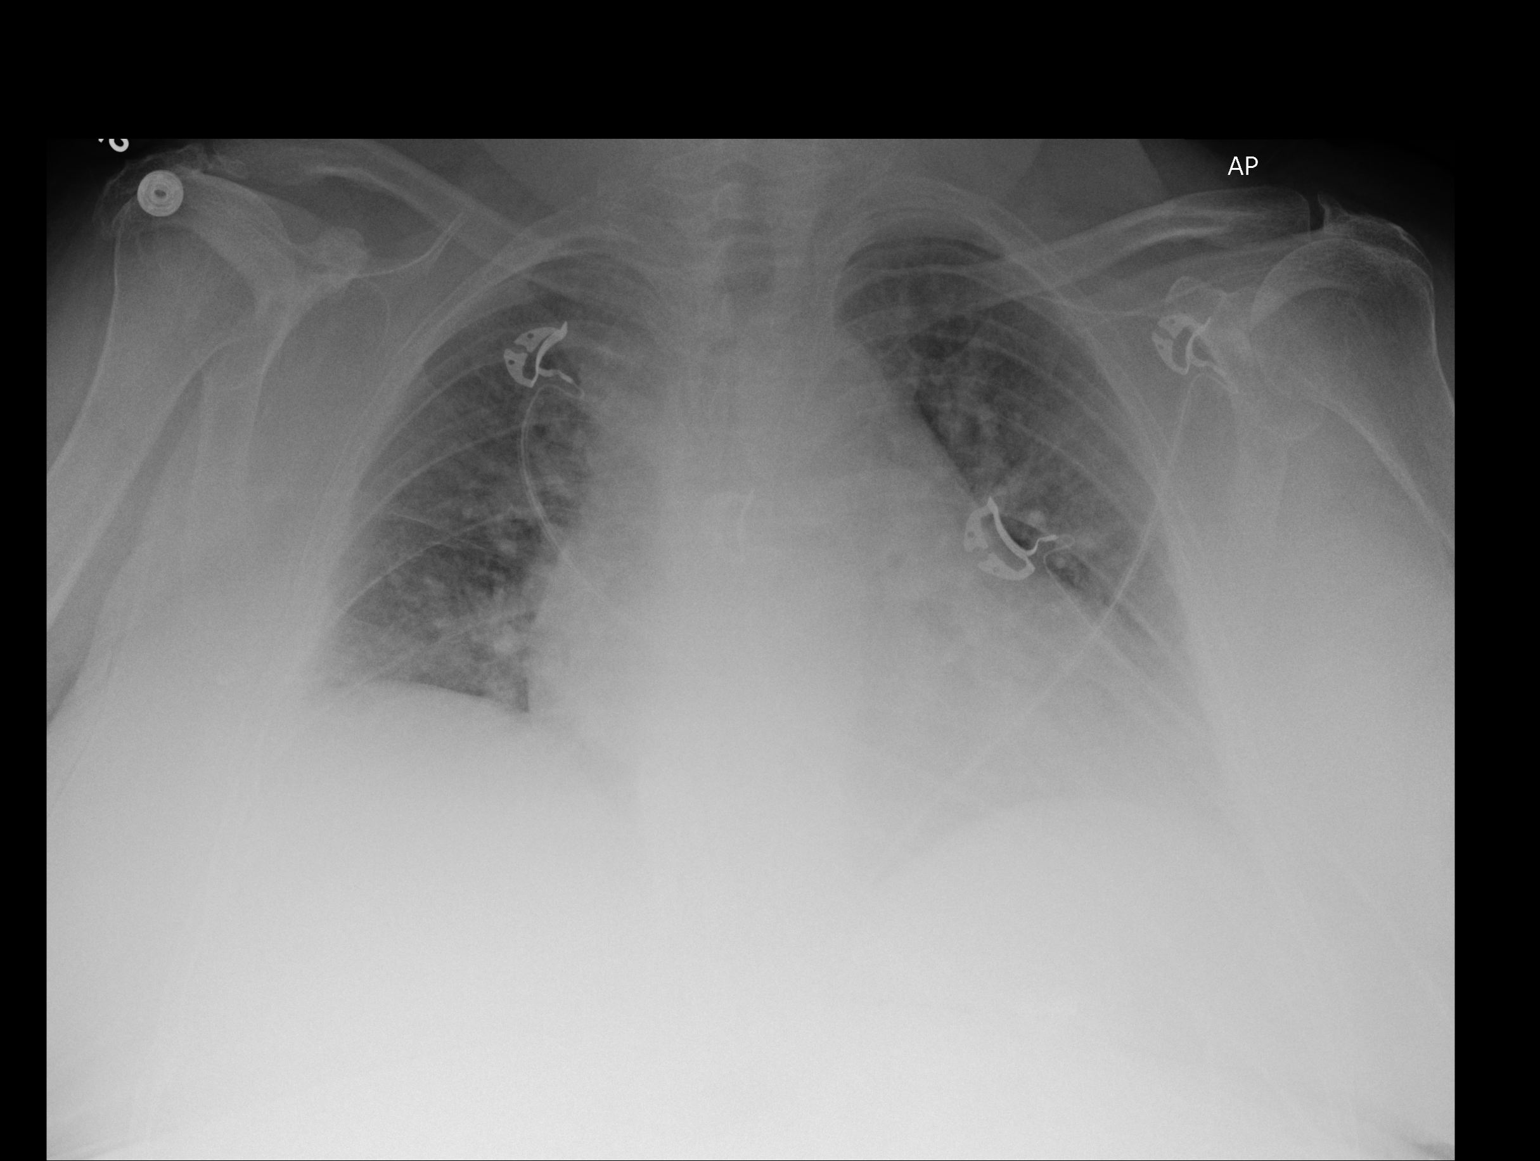

[1 of 1 positions shown; findings below may reference images not displayed]

FINDINGS: Stable cardiomegaly. No pneumothorax or pleural effusion is noted.
No acute pulmonary disease is noted. Bony thorax is unremarkable.
IMPRESSION: No acute cardiopulmonary abnormality seen.

## 2018-09-07 DIAGNOSIS — N184 Chronic kidney disease, stage 4 (severe): Secondary | ICD-10-CM | POA: Diagnosis not present

## 2018-09-07 DIAGNOSIS — G9340 Encephalopathy, unspecified: Secondary | ICD-10-CM | POA: Diagnosis not present

## 2018-09-07 DIAGNOSIS — D631 Anemia in chronic kidney disease: Secondary | ICD-10-CM | POA: Diagnosis not present

## 2018-09-07 DIAGNOSIS — E1122 Type 2 diabetes mellitus with diabetic chronic kidney disease: Secondary | ICD-10-CM | POA: Diagnosis not present

## 2018-09-07 DIAGNOSIS — I5032 Chronic diastolic (congestive) heart failure: Secondary | ICD-10-CM | POA: Diagnosis not present

## 2018-09-07 DIAGNOSIS — I130 Hypertensive heart and chronic kidney disease with heart failure and stage 1 through stage 4 chronic kidney disease, or unspecified chronic kidney disease: Secondary | ICD-10-CM | POA: Diagnosis not present

## 2018-09-11 DIAGNOSIS — D631 Anemia in chronic kidney disease: Secondary | ICD-10-CM | POA: Diagnosis not present

## 2018-09-11 DIAGNOSIS — I130 Hypertensive heart and chronic kidney disease with heart failure and stage 1 through stage 4 chronic kidney disease, or unspecified chronic kidney disease: Secondary | ICD-10-CM | POA: Diagnosis not present

## 2018-09-11 DIAGNOSIS — G9340 Encephalopathy, unspecified: Secondary | ICD-10-CM | POA: Diagnosis not present

## 2018-09-11 DIAGNOSIS — N184 Chronic kidney disease, stage 4 (severe): Secondary | ICD-10-CM | POA: Diagnosis not present

## 2018-09-11 DIAGNOSIS — E1122 Type 2 diabetes mellitus with diabetic chronic kidney disease: Secondary | ICD-10-CM | POA: Diagnosis not present

## 2018-09-11 DIAGNOSIS — I5032 Chronic diastolic (congestive) heart failure: Secondary | ICD-10-CM | POA: Diagnosis not present

## 2018-09-12 ENCOUNTER — Inpatient Hospital Stay (HOSPITAL_COMMUNITY)
Admission: EM | Admit: 2018-09-12 | Discharge: 2018-09-19 | DRG: 689 | Disposition: A | Discharge status: Home Health | Payer: Medicare Other | Source: Skilled Nursing Facility | Attending: Internal Medicine | Admitting: Internal Medicine

## 2018-09-12 DIAGNOSIS — F29 Unspecified psychosis not due to a substance or known physiological condition: Secondary | ICD-10-CM | POA: Diagnosis not present

## 2018-09-12 DIAGNOSIS — I5032 Chronic diastolic (congestive) heart failure: Secondary | ICD-10-CM | POA: Diagnosis not present

## 2018-09-12 DIAGNOSIS — L8990 Pressure ulcer of unspecified site, unspecified stage: Secondary | ICD-10-CM

## 2018-09-12 DIAGNOSIS — M069 Rheumatoid arthritis, unspecified: Secondary | ICD-10-CM | POA: Diagnosis present

## 2018-09-12 DIAGNOSIS — Z87891 Personal history of nicotine dependence: Secondary | ICD-10-CM

## 2018-09-12 DIAGNOSIS — F209 Schizophrenia, unspecified: Secondary | ICD-10-CM | POA: Diagnosis not present

## 2018-09-12 DIAGNOSIS — R45851 Suicidal ideations: Secondary | ICD-10-CM | POA: Diagnosis not present

## 2018-09-12 DIAGNOSIS — E1142 Type 2 diabetes mellitus with diabetic polyneuropathy: Secondary | ICD-10-CM | POA: Diagnosis not present

## 2018-09-12 DIAGNOSIS — R4701 Aphasia: Secondary | ICD-10-CM | POA: Diagnosis not present

## 2018-09-12 DIAGNOSIS — Z8673 Personal history of transient ischemic attack (TIA), and cerebral infarction without residual deficits: Secondary | ICD-10-CM | POA: Diagnosis not present

## 2018-09-12 DIAGNOSIS — B952 Enterococcus as the cause of diseases classified elsewhere: Secondary | ICD-10-CM | POA: Diagnosis not present

## 2018-09-12 DIAGNOSIS — J449 Chronic obstructive pulmonary disease, unspecified: Secondary | ICD-10-CM | POA: Diagnosis not present

## 2018-09-12 DIAGNOSIS — Z7952 Long term (current) use of systemic steroids: Secondary | ICD-10-CM | POA: Diagnosis not present

## 2018-09-12 DIAGNOSIS — Z79899 Other long term (current) drug therapy: Secondary | ICD-10-CM | POA: Diagnosis not present

## 2018-09-12 DIAGNOSIS — T1491XA Suicide attempt, initial encounter: Secondary | ICD-10-CM | POA: Diagnosis not present

## 2018-09-12 DIAGNOSIS — N184 Chronic kidney disease, stage 4 (severe): Secondary | ICD-10-CM | POA: Diagnosis present

## 2018-09-12 DIAGNOSIS — I639 Cerebral infarction, unspecified: Secondary | ICD-10-CM | POA: Diagnosis present

## 2018-09-12 DIAGNOSIS — R29708 NIHSS score 8: Secondary | ICD-10-CM | POA: Diagnosis present

## 2018-09-12 DIAGNOSIS — I130 Hypertensive heart and chronic kidney disease with heart failure and stage 1 through stage 4 chronic kidney disease, or unspecified chronic kidney disease: Secondary | ICD-10-CM | POA: Diagnosis present

## 2018-09-12 DIAGNOSIS — Z86711 Personal history of pulmonary embolism: Secondary | ICD-10-CM

## 2018-09-12 DIAGNOSIS — E785 Hyperlipidemia, unspecified: Secondary | ICD-10-CM | POA: Diagnosis not present

## 2018-09-12 DIAGNOSIS — I5033 Acute on chronic diastolic (congestive) heart failure: Secondary | ICD-10-CM | POA: Diagnosis present

## 2018-09-12 DIAGNOSIS — E1165 Type 2 diabetes mellitus with hyperglycemia: Secondary | ICD-10-CM | POA: Diagnosis present

## 2018-09-12 DIAGNOSIS — G9341 Metabolic encephalopathy: Secondary | ICD-10-CM | POA: Diagnosis present

## 2018-09-12 DIAGNOSIS — E039 Hypothyroidism, unspecified: Secondary | ICD-10-CM | POA: Diagnosis not present

## 2018-09-12 DIAGNOSIS — R4020 Unspecified coma: Secondary | ICD-10-CM | POA: Diagnosis not present

## 2018-09-12 DIAGNOSIS — K219 Gastro-esophageal reflux disease without esophagitis: Secondary | ICD-10-CM | POA: Diagnosis present

## 2018-09-12 DIAGNOSIS — N390 Urinary tract infection, site not specified: Secondary | ICD-10-CM | POA: Diagnosis not present

## 2018-09-12 DIAGNOSIS — E1122 Type 2 diabetes mellitus with diabetic chronic kidney disease: Secondary | ICD-10-CM | POA: Diagnosis not present

## 2018-09-12 DIAGNOSIS — Z7984 Long term (current) use of oral hypoglycemic drugs: Secondary | ICD-10-CM | POA: Diagnosis not present

## 2018-09-12 DIAGNOSIS — F259 Schizoaffective disorder, unspecified: Secondary | ICD-10-CM | POA: Diagnosis present

## 2018-09-12 DIAGNOSIS — T17908A Unspecified foreign body in respiratory tract, part unspecified causing other injury, initial encounter: Secondary | ICD-10-CM

## 2018-09-12 DIAGNOSIS — F203 Undifferentiated schizophrenia: Secondary | ICD-10-CM

## 2018-09-12 DIAGNOSIS — Z7982 Long term (current) use of aspirin: Secondary | ICD-10-CM | POA: Diagnosis not present

## 2018-09-12 DIAGNOSIS — I10 Essential (primary) hypertension: Secondary | ICD-10-CM | POA: Diagnosis present

## 2018-09-12 DIAGNOSIS — Z79890 Hormone replacement therapy (postmenopausal): Secondary | ICD-10-CM | POA: Diagnosis not present

## 2018-09-12 DIAGNOSIS — R52 Pain, unspecified: Secondary | ICD-10-CM | POA: Diagnosis not present

## 2018-09-12 DIAGNOSIS — M797 Fibromyalgia: Secondary | ICD-10-CM | POA: Diagnosis present

## 2018-09-12 DIAGNOSIS — E119 Type 2 diabetes mellitus without complications: Secondary | ICD-10-CM

## 2018-09-12 DIAGNOSIS — Z6837 Body mass index (BMI) 37.0-37.9, adult: Secondary | ICD-10-CM

## 2018-09-12 DIAGNOSIS — D649 Anemia, unspecified: Secondary | ICD-10-CM | POA: Diagnosis present

## 2018-09-12 DIAGNOSIS — G9340 Encephalopathy, unspecified: Secondary | ICD-10-CM

## 2018-09-12 DIAGNOSIS — R0989 Other specified symptoms and signs involving the circulatory and respiratory systems: Secondary | ICD-10-CM | POA: Diagnosis not present

## 2018-09-12 DIAGNOSIS — F4310 Post-traumatic stress disorder, unspecified: Secondary | ICD-10-CM | POA: Diagnosis present

## 2018-09-12 DIAGNOSIS — I6621 Occlusion and stenosis of right posterior cerebral artery: Secondary | ICD-10-CM | POA: Diagnosis not present

## 2018-09-12 NOTE — ED Triage Notes (Signed)
Pt brought in by [facility] for c/o of having hallucinations; pt was found by the nurse with a plastic bag on her head and when the nurse removed the plastic bag she went and found another plastic bag and told ems she wanted to kill herself so she could get rid of the demons she sees and hears.

## 2018-09-13 ENCOUNTER — Other Ambulatory Visit: Payer: Self-pay

## 2018-09-13 ENCOUNTER — Encounter (HOSPITAL_COMMUNITY): Payer: Self-pay | Admitting: *Deleted

## 2018-09-13 LAB — URINALYSIS, ROUTINE W REFLEX MICROSCOPIC
Bilirubin Urine: NEGATIVE
Glucose, UA: >=500 mg/dL — AB
Hgb urine dipstick: NEGATIVE
Ketones, ur: NEGATIVE mg/dL
Leukocytes, UA: NEGATIVE
Nitrite: NEGATIVE
Protein, ur: >=300 mg/dL — AB
Specific Gravity, Urine: 1.01 (ref 1.005–1.030)
pH: 6 (ref 5.0–8.0)

## 2018-09-13 LAB — CBC WITH DIFFERENTIAL/PLATELET
Abs Immature Granulocytes: 0.03 10*3/uL (ref 0.00–0.07)
Basophils Absolute: 0 10*3/uL (ref 0.0–0.1)
Basophils Relative: 0 %
Eosinophils Absolute: 0.3 10*3/uL (ref 0.0–0.5)
Eosinophils Relative: 4 %
HCT: 25.9 % — ABNORMAL LOW (ref 36.0–46.0)
Hemoglobin: 8.6 g/dL — ABNORMAL LOW (ref 12.0–15.0)
Immature Granulocytes: 1 %
Lymphocytes Relative: 13 %
Lymphs Abs: 0.8 10*3/uL (ref 0.7–4.0)
MCH: 31.9 pg (ref 26.0–34.0)
MCHC: 33.2 g/dL (ref 30.0–36.0)
MCV: 95.9 fL (ref 80.0–100.0)
Monocytes Absolute: 0.8 10*3/uL (ref 0.1–1.0)
Monocytes Relative: 13 %
Neutro Abs: 4.5 10*3/uL (ref 1.7–7.7)
Neutrophils Relative %: 69 %
Platelets: 160 10*3/uL (ref 150–400)
RBC: 2.7 MIL/uL — ABNORMAL LOW (ref 3.87–5.11)
RDW: 13.3 % (ref 11.5–15.5)
WBC: 6.4 10*3/uL (ref 4.0–10.5)
nRBC: 0 % (ref 0.0–0.2)

## 2018-09-13 LAB — COMPREHENSIVE METABOLIC PANEL
ALT: 23 U/L (ref 0–44)
AST: 18 U/L (ref 15–41)
Albumin: 3 g/dL — ABNORMAL LOW (ref 3.5–5.0)
Alkaline Phosphatase: 76 U/L (ref 38–126)
Anion gap: 6 (ref 5–15)
BUN: 28 mg/dL — ABNORMAL HIGH (ref 8–23)
CO2: 24 mmol/L (ref 22–32)
Calcium: 8.6 mg/dL — ABNORMAL LOW (ref 8.9–10.3)
Chloride: 107 mmol/L (ref 98–111)
Creatinine, Ser: 2.19 mg/dL — ABNORMAL HIGH (ref 0.44–1.00)
GFR calc Af Amer: 26 mL/min — ABNORMAL LOW (ref >60)
GFR calc non Af Amer: 23 mL/min — ABNORMAL LOW (ref >60)
Glucose, Bld: 224 mg/dL — ABNORMAL HIGH (ref 70–99)
Potassium: 3.7 mmol/L (ref 3.5–5.1)
Sodium: 137 mmol/L (ref 135–145)
Total Bilirubin: 0.4 mg/dL (ref 0.3–1.2)
Total Protein: 6.2 g/dL — ABNORMAL LOW (ref 6.5–8.1)

## 2018-09-13 LAB — RAPID URINE DRUG SCREEN, HOSP PERFORMED
Amphetamines: NOT DETECTED
Barbiturates: NOT DETECTED
Benzodiazepines: NOT DETECTED
Cocaine: NOT DETECTED
Opiates: NOT DETECTED
Tetrahydrocannabinol: NOT DETECTED

## 2018-09-13 LAB — CBG MONITORING, ED
Glucose-Capillary: 175 mg/dL — ABNORMAL HIGH (ref 70–99)
Glucose-Capillary: 111 mg/dL — ABNORMAL HIGH (ref 70–99)
Glucose-Capillary: 254 mg/dL — ABNORMAL HIGH (ref 70–99)

## 2018-09-13 LAB — SALICYLATE LEVEL: Salicylate Lvl: <7 mg/dL (ref 2.8–30.0)

## 2018-09-13 LAB — ACETAMINOPHEN LEVEL: Acetaminophen (Tylenol), Serum: <10 ug/mL — ABNORMAL LOW (ref 10–30)

## 2018-09-13 LAB — ETHANOL: Alcohol, Ethyl (B): <10 mg/dL (ref <10)

## 2018-09-13 MED ORDER — AMLODIPINE BESYLATE 5 MG PO TABS
5.0000 mg | ORAL_TABLET | Freq: Every day | ORAL | Status: DC
  Administered 2018-09-13 – 2018-09-14 (×2): 5 mg via ORAL
  Filled 2018-09-13 (×2): qty 1

## 2018-09-13 MED ORDER — ALBUTEROL SULFATE HFA 108 (90 BASE) MCG/ACT IN AERS
1.0000 | INHALATION_SPRAY | Freq: Four times a day (QID) | RESPIRATORY_TRACT | Status: DC | PRN
  Administered 2018-09-14: 2 via RESPIRATORY_TRACT
  Filled 2018-09-13: qty 6.7

## 2018-09-13 MED ORDER — CIPROFLOXACIN HCL 250 MG PO TABS
250.0000 mg | ORAL_TABLET | Freq: Two times a day (BID) | ORAL | Status: DC
  Administered 2018-09-13 – 2018-09-14 (×3): 250 mg via ORAL
  Filled 2018-09-13 (×4): qty 1

## 2018-09-13 MED ORDER — ACETAMINOPHEN 325 MG PO TABS
325.0000 mg | ORAL_TABLET | Freq: Two times a day (BID) | ORAL | Status: DC
  Administered 2018-09-13 – 2018-09-14 (×4): 325 mg via ORAL
  Filled 2018-09-13 (×4): qty 1

## 2018-09-13 MED ORDER — CARVEDILOL 12.5 MG PO TABS
6.2500 mg | ORAL_TABLET | Freq: Two times a day (BID) | ORAL | Status: DC
  Administered 2018-09-13 – 2018-09-14 (×4): 6.25 mg via ORAL
  Filled 2018-09-13 (×5): qty 1

## 2018-09-13 MED ORDER — HALOPERIDOL LACTATE 5 MG/ML IJ SOLN
2.0000 mg | Freq: Once | INTRAMUSCULAR | Status: AC
  Administered 2018-09-13: 2 mg via INTRAMUSCULAR
  Filled 2018-09-13 (×2): qty 1

## 2018-09-13 MED ORDER — LEVETIRACETAM 500 MG PO TABS
500.0000 mg | ORAL_TABLET | Freq: Two times a day (BID) | ORAL | Status: DC
  Administered 2018-09-13 – 2018-09-14 (×4): 500 mg via ORAL
  Filled 2018-09-13 (×5): qty 1

## 2018-09-13 MED ORDER — HALOPERIDOL 0.5 MG PO TABS
0.5000 mg | ORAL_TABLET | Freq: Two times a day (BID) | ORAL | Status: DC
  Administered 2018-09-13 (×2): 0.5 mg via ORAL
  Filled 2018-09-13 (×7): qty 1

## 2018-09-13 MED ORDER — VITAMIN B-6 100 MG PO TABS: 200.0000 mg | ORAL_TABLET | Freq: Two times a day (BID) | ORAL | Status: DC

## 2018-09-13 MED ORDER — ASPIRIN EC 81 MG PO TBEC
81.0000 mg | DELAYED_RELEASE_TABLET | Freq: Every day | ORAL | Status: DC
  Administered 2018-09-13 – 2018-09-14 (×2): 81 mg via ORAL
  Filled 2018-09-13 (×2): qty 1

## 2018-09-13 MED ORDER — LEVOTHYROXINE SODIUM 50 MCG PO TABS
25.0000 ug | ORAL_TABLET | Freq: Every day | ORAL | Status: DC
  Administered 2018-09-13 – 2018-09-15 (×3): 25 ug via ORAL
  Filled 2018-09-13 (×3): qty 1

## 2018-09-13 MED ORDER — SPIRONOLACTONE 25 MG PO TABS
25.0000 mg | ORAL_TABLET | Freq: Every day | ORAL | Status: DC
  Administered 2018-09-13: 25 mg via ORAL
  Filled 2018-09-13 (×6): qty 1

## 2018-09-13 MED ORDER — GABAPENTIN 300 MG PO CAPS
300.0000 mg | ORAL_CAPSULE | Freq: Once | ORAL | Status: AC
  Administered 2018-09-13: 300 mg via ORAL
  Filled 2018-09-13: qty 1

## 2018-09-13 MED ORDER — DOCUSATE SODIUM 100 MG PO CAPS
100.0000 mg | ORAL_CAPSULE | Freq: Every day | ORAL | Status: DC
  Administered 2018-09-14: 100 mg via ORAL
  Filled 2018-09-13: qty 1

## 2018-09-13 MED ORDER — RISPERIDONE 1 MG PO TABS
0.5000 mg | ORAL_TABLET | Freq: Every day | ORAL | Status: DC
  Administered 2018-09-13 – 2018-09-14 (×2): 0.5 mg via ORAL
  Filled 2018-09-13 (×2): qty 1

## 2018-09-13 NOTE — ED Notes (Addendum)
Patriciaann Clan, PA, patient meets inpatient criteria, geriatric psychiatric unit recommended. TTS to secure placement. Legrand Como, RN, informed of disposition.
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
## 2018-09-13 NOTE — ED Notes (Signed)
Patient calling out screaming stating that she is seeing demons. Consoled patient and calmed her down. Patient still screaming periodically and trying to get up from bed.

## 2018-09-13 NOTE — ED Notes (Signed)
Patient refused morning medication.  

## 2018-09-13 NOTE — ED Provider Notes (Signed)
Patient is upset and tearful.  She states the demons are making her scared.  We will give her a dose of Haldol IM.   Dorie Rank, MD 09/13/18 [phone]

## 2018-09-13 NOTE — ED Notes (Addendum)
This RN gave pt PO Haldol at 1519. Before giving PO haldol pt was calm and cooperative. When given pt stated that she "doesn't take Haldol because it makes her mean and crazy." Per sitter pt started to act up and was wanting to get up, and was unable to redirect. This RN spoke with Dr. Roderic Palau, he stated to give the 2mg  Haldol IM that was previously ordered. When this RN went into room to give the IM Haldol, the pt stated again that she could not take the Haldol because "it makes her crazy." This RN spoke with the [place] about what the pt had stated to this RN. This RN and Tiffany, RN went into room to talk to the pt because she was stating to get up and the sitters went into the room. When we went to the room to speak to pt, the pt stated she couldn't take Haldol "because it makes me crazy." When the RN asked her when she knew it made her crazy and what happened? The pt stated "I first took it when I was 9. It makes me feel like I'm going crazy and I see monsters and demons." Tiffany, RN told this RN to call [facility] to ask about how long she has taken the medication and then to call [facility] to see if they can review her medications and charts and see if they can suggest something else to keep her calm.

## 2018-09-13 NOTE — ED Provider Notes (Addendum)
[facility] EMERGENCY DEPARTMENT Provider Note   CSN: [id number] Arrival date & time: 09/12/18  2358     History   Chief Complaint Chief Complaint  Patient presents with  . V70.1    HPI [name] is a 68 y.o. female.  The history is provided by the patient.  Mental Health Problem  Presenting symptoms: hallucinations, suicidal thoughts and suicide attempt   Degree of incapacity (severity):  Severe Onset quality:  Sudden Timing:  Constant Progression:  Worsening Chronicity:  New Worsened by:  Nothing Associated symptoms: no headaches    Patient presents for her 16th ER visit in 6 months.  She has a history of CHF/CKD/COPD/depression/schizophrenia She stays at local nursing facility She reports that "monster" are after her and telling her to kill herself.  Apparently she was found by a nurse with a plastic bag over her head.  When she removed the plastic bag, the patient tried putting other bag over her head again Past Medical History:  Diagnosis Date  . Anxiety   . Asthma   . Back pain   . Barrett esophagus   . CHF (congestive heart failure) ([place])   . CKD (chronic kidney disease) stage 3, GFR 30-59 ml/min (HCC)   . COPD (chronic obstructive pulmonary disease) ([place])   . Depression   . Diastolic heart failure ([place])   . Essential hypertension   . Fibromyalgia   . GERD (gastroesophageal reflux disease)   . Gout   . History of pericarditis   . Hyperlipidemia   . Hypothyroid   . Major depressive disorder   . Memory loss   . Nephrolithiasis    2008  . Obesity hypoventilation syndrome (HCC)    CPAP  . Obstructive sleep apnea   . PE (pulmonary embolism)    2010  . Peripheral neuropathy   . PTSD (post-traumatic stress disorder)   . Rheumatoid arthritis ([place])   . Schizophrenia ([place])   . Steroid dependence   . Type 2 diabetes mellitus ([place])   . Vitamin D deficiency     Patient Active Problem List   Diagnosis Date Noted  . Respiratory failure ([place])  08/16/2018  . Seizure ([place]) 08/16/2018  . Acute encephalopathy 07/27/2018  . Endotracheally intubated 07/27/2018  . Hypothyroidism 07/27/2018  . Pleuritic chest pain 07/14/2018  . Moderate persistent asthma without complication   . Type 2 diabetes mellitus ([place]) 04/19/2018  . Acute kidney injury superimposed on chronic kidney disease ([place]) 01/13/2018  . Altered mental status   . Encephalopathy 10/11/2016  . Chest pain 08/12/2016  . CKD (chronic kidney disease), stage IV ([place]) 08/12/2016  . History of pulmonary embolus (PE) 08/12/2016  . Obesity, Class III, BMI 40-49.9 (morbid obesity) ([place]) 08/12/2016  . RBBB 08/12/2016  . Positive D dimer 08/12/2016  . CVA (cerebral infarction) 11/10/2015  . Physical deconditioning 07/01/2011  . Weakness 07/01/2011  . Diabetic foot ulcer ([place]) 05/19/2011  . Polypharmacy 02/09/2011  . BACK PAIN WITH RADICULOPATHY 05/25/2010  . Type 2 diabetes mellitus with diabetic polyneuropathy, without long-term current use of insulin ([place]) 08/18/2009  . Chronic diastolic heart failure ([place]) 04/25/2009  . Normocytic anemia 04/14/2009  . Sleep apnea 12/11/2008  . Diabetic peripheral neuropathy ([place]) 05/04/2007  . HYPERCHOLESTEROLEMIA 10/13/2006  . Gout, unspecified 10/13/2006  . HYPOKALEMIA 10/13/2006  . Schizophrenia ([place]) 10/13/2006  . Depression 10/13/2006  . Essential hypertension 10/13/2006  . Venous (peripheral) insufficiency 10/13/2006  . RHINITIS, ALLERGIC 10/13/2006  . Asthma 10/13/2006  . Reflux esophagitis 10/13/2006  .  OSTEOARTHRITIS, MULTI SITES 10/13/2006  . INCONTINENCE, URGE 10/13/2006    Past Surgical History:  Procedure Laterality Date  . APPENDECTOMY    . CHOLECYSTECTOMY    . COLONOSCOPY     benign polyps (12/2000), repeat colonoscopy performed in 2007  . ENDOMETRIAL BIOPSY     10/03 benign columnar mucosa (limited material)  . KIDNEY SURGERY       OB History   No obstetric history on file.      Home Medications     Prior to Admission medications   Medication Sig Start Date End Date Taking? Authorizing Provider  acetaminophen (TYLENOL) 325 MG tablet Take 325 mg by mouth 2 (two) times daily.     [provider]  albuterol (PROVENTIL) (2.5 MG/3ML) 0.083% nebulizer solution Take 2.5 mg by nebulization every 6 (six) hours as needed for wheezing.     [provider]  albuterol (VENTOLIN HFA) 108 (90 Base) MCG/ACT inhaler Inhale 1-2 puffs into the lungs every 6 (six) hours as needed for wheezing or shortness of breath.    [provider]  amLODipine (NORVASC) 5 MG tablet Take 1 tablet (5 mg total) by mouth daily. 07/31/18 07/31/19  GhimireHenreitta Leber, MD  aspirin 81 MG tablet Take 81 mg by mouth daily.    [provider]  carvedilol (COREG) 6.25 MG tablet Take 1 tablet (6.25 mg total) by mouth 2 (two) times daily with a meal. 08/18/18   Lavina Hamman, MD  Cholecalciferol (VITAMIN D3) 50 MCG (2000 UT) TABS Take 4,000 Units by mouth daily.    [provider]  docusate sodium (COLACE) 100 MG capsule Take 100 mg by mouth daily.     [provider]  EPINEPHrine (EPI-PEN) 0.3 mg/0.3 mL DEVI Inject 0.3 mg into the muscle once as needed (for bee stings, then go to the ED immediately after using).     [provider]  glipiZIDE (GLUCOTROL XL) 5 MG 24 hr tablet Take 5 mg by mouth 2 (two) times daily.     [provider]  Glycerin, Laxative, (FLEET LIQUID GLYCERIN SUPP RE) Place 1 suppository rectally daily as needed (for constipation).    [provider]  haloperidol (HALDOL) 0.5 MG tablet Take 1 tablet (0.5 mg total) by mouth 2 (two) times daily. 07/31/18   Ghimire, Henreitta Leber, MD  hydrocortisone 2.5 % ointment Apply 1 application topically daily as needed (itchy skin).    [provider]  levETIRAcetam (KEPPRA) 500 MG tablet Take 1 tablet (500 mg total) by mouth 2 (two) times daily. 08/18/18   Lavina Hamman, MD  levothyroxine  (SYNTHROID, LEVOTHROID) 25 MCG tablet Take 1 tablet (25 mcg total) by mouth daily at 6 (six) AM. 08/19/18   Lavina Hamman, MD  linagliptin (TRADJENTA) 5 MG TABS tablet Take 5 mg by mouth daily.    [provider]  loperamide (IMODIUM) 2 MG capsule Take 2 mg by mouth 3 (three) times daily as needed (for diarrhea).     [provider]  Multiple Vitamin (DAILY VITE PO) Take 1 tablet by mouth daily.    [provider]  omeprazole (PRILOSEC) 20 MG capsule Take 20 mg by mouth daily.     [provider]  OVER THE COUNTER MEDICATION CPAP    [provider]  polyethylene glycol powder (GLYCOLAX/MIRALAX) powder Take 17 g by mouth daily as needed (for constipation).    [provider]  pyridOXINE (VITAMIN B-6) 100 MG tablet Take 200 mg  by mouth 2 (two) times daily.    [provider]  spironolactone (ALDACTONE) 25 MG tablet Take 1 tablet (25 mg total) by mouth daily. 04/12/18   Mesner, Corene Cornea, MD  torsemide (DEMADEX) 20 MG tablet Take 60 mg alternating with 40 mg daily. Patient taking differently: Take 40-60 mg by mouth See admin instructions. 60 mg by mouth every other day: alternating with 40 mg by mouth every other day 07/26/18   Erma Heritage, PA-C    Family History Family History  Problem Relation Age of Onset  . Bone cancer Mother   . Hypertension Mother   . Heart disease Mother   . COPD Father   . Heart disease Father   . Stroke Father     Social History Social History   Tobacco Use  . Smoking status: Former Smoker    Types: Cigarettes    Last attempt to quit: 06/23/1972    Years since quitting: 46.2  . Smokeless tobacco: Never Used  Substance Use Topics  . Alcohol use: No  . Drug use: No     Allergies   Benztropine mesylate; Codeine; Diazepam; Diphenhydramine hcl; Penicillins; Sulfonamide derivatives; Thioridazine hcl; Benztropine; Penicillin g; Lisinopril; and Sulfamethoxazole   Review of Systems Review of  Systems  Constitutional: Negative for fever.  Gastrointestinal: Negative for vomiting.  Neurological: Negative for headaches.  Psychiatric/Behavioral: Positive for hallucinations and suicidal ideas.  All other systems reviewed and are negative.    Physical Exam Updated Vital Signs BP (!) 178/71 (BP Location: Right Arm)   Pulse 88   Temp 98.2 F (36.8 C) (Oral)   Resp 17   SpO2 100%   Physical Exam CONSTITUTIONAL: Elderly, disheveled HEAD: Normocephalic/atraumatic, no signs of trauma EYES: EOMI/PERRL ENMT: Mucous membranes moist NECK: supple no meningeal signs, no erythema or bruising to neck SPINE/BACK:entire spine nontender CV: S1/S2 noted, no murmurs/rubs/gallops noted LUNGS: Lungs are clear to auscultation bilaterally, no apparent distress ABDOMEN: soft, nontender, no rebound or guarding, bowel sounds noted throughout abdomen GU:no cva tenderness NEURO: Pt is awake/alert/appropriate, moves all extremitiesx4.  No facial droop.  No arm or leg drift.  Patient is alert and oriented to person place and time EXTREMITIES: pulses normal/equal, full ROM, no visible trauma SKIN: warm, color normal PSYCH: Patient is anxious, reporting hallucinations.   ED Treatments / Results  Labs (all labs ordered are listed, but only abnormal results are displayed) Labs Reviewed  CBC WITH DIFFERENTIAL/PLATELET - Abnormal; Notable for the following components:      Result Value   RBC 2.70 (*)    Hemoglobin 8.6 (*)    HCT 25.9 (*)    All other components within normal limits  COMPREHENSIVE METABOLIC PANEL - Abnormal; Notable for the following components:   Glucose, Bld 224 (*)    BUN 28 (*)    Creatinine, Ser 2.19 (*)    Calcium 8.6 (*)    Total Protein 6.2 (*)    Albumin 3.0 (*)    GFR calc non Af Amer 23 (*)    GFR calc Af Amer 26 (*)    All other components within normal limits  ACETAMINOPHEN LEVEL - Abnormal; Notable for the following components:   Acetaminophen (Tylenol), Serum  <10 (*)    All other components within normal limits  CBG MONITORING, ED - Abnormal; Notable for the following components:   Glucose-Capillary 175 (*)    All other components within normal limits  SALICYLATE LEVEL  ETHANOL  RAPID URINE DRUG SCREEN, HOSP PERFORMED  URINALYSIS, ROUTINE W REFLEX MICROSCOPIC    EKG ED ECG REPORT   Date: 09/13/2018 0049  Rate: 86  Rhythm: normal sinus rhythm  QRS Axis: left  Intervals: normal  ST/T Wave abnormalities: normal  Conduction Disutrbances:left bundle branch block and left anterior fascicular block  Narrative Interpretation:   Old EKG Reviewed: unchanged  I have personally reviewed the EKG tracing and agree with the computerized printout as noted.  Radiology No results found.  Procedures Procedures    Medications Ordered in ED Medications  acetaminophen (TYLENOL) tablet 325 mg (325 mg Oral Not Given 09/13/18 0253)  albuterol (PROVENTIL HFA;VENTOLIN HFA) 108 (90 Base) MCG/ACT inhaler 1-2 puff (has no administration in time range)  amLODipine (NORVASC) tablet 5 mg (has no administration in time range)  aspirin EC tablet 81 mg (has no administration in time range)  carvedilol (COREG) tablet 6.25 mg (has no administration in time range)  docusate sodium (COLACE) capsule 100 mg (has no administration in time range)  haloperidol (HALDOL) tablet 0.5 mg (0.5 mg Oral Not Given 09/13/18 0254)  levETIRAcetam (KEPPRA) tablet 500 mg (500 mg Oral Not Given 09/13/18 0254)  levothyroxine (SYNTHROID, LEVOTHROID) tablet 25 mcg (has no administration in time range)  spironolactone (ALDACTONE) tablet 25 mg (has no administration in time range)     Initial Impression / Assessment and Plan / ED Course  I have reviewed the triage vital signs and the nursing notes.  Pertinent labs & imaging results that were available during my care of the patient were reviewed by me and considered in my medical decision making (see chart for details).     1:10  AM Patient with extensive medical and psychiatric history presents with hallucinations and suicide attempt.  She is awake and alert and answers other questions appropriately denies any other medical complaints.  Medical screening is underway, then will require psychiatric evaluation 3:16 AM Initial medical evaluation essentially unremarkable for patient.  She has a history of chronic renal insufficiency She is resting comfortably at this time.  Awaiting psych evaluation Final Clinical Impressions(s) / ED Diagnoses   Final diagnoses:  Schizophrenia, unspecified type [facility])  Suicide attempt [facility])    ED Discharge Orders    None       Ripley Fraise, MD 09/13/18 9892    Ripley Fraise, MD 09/13/18 [phone]

## 2018-09-13 NOTE — Consult Note (Signed)
  Medication Recommendation   Spoke with Joellen Jersey, RN who reported that patient was having adverse reaction to Haldol and that patient was also complaining that Haldol made things worse for her making her feel like she was going crazy.  Katie, RN familiar with this patient and that patient was started on haldol around [place] Years and has been in out.    Reviewed patient chart and 07/15/2018 patient was taking Risperdal 0.5 mg bid.     Recommendation:  Stop Haldol 0.5 mg and Start Risperdal 0.5 mg bid.  Patient may also need medication for UTI.     Katie, RN states that she will inform EDP (provider) of above recommendation.    Shuvon B. Rankin, NP

## 2018-09-13 NOTE — ED Notes (Signed)
Patient had one episode of vomiting.   Patient showered and bed changed.

## 2018-09-13 NOTE — Progress Notes (Signed)
Pt. meets criteria for inpatient treatment per Lindon Romp,  NP.  Referred out to the following hospitals: [facility]  [facility]  [facility]      Disposition CSW will continue to follow for placement.  Areatha Keas. Judi Cong, MSW, [place] Disposition Clinical Social Work [phone] (cell) [phone] (office)

## 2018-09-13 NOTE — ED Notes (Signed)
Pt to restroom

## 2018-09-13 NOTE — ED Notes (Signed)
Patient denies nausea or abdominal pain.

## 2018-09-14 LAB — CBG MONITORING, ED
Glucose-Capillary: 153 mg/dL — ABNORMAL HIGH (ref 70–99)
Glucose-Capillary: 261 mg/dL — ABNORMAL HIGH (ref 70–99)
Glucose-Capillary: 366 mg/dL — ABNORMAL HIGH (ref 70–99)

## 2018-09-14 MED ORDER — TRAMADOL HCL 50 MG PO TABS
50.0000 mg | ORAL_TABLET | Freq: Once | ORAL | Status: AC
  Administered 2018-09-14: 50 mg via ORAL
  Filled 2018-09-14: qty 1

## 2018-09-14 MED ORDER — RISPERIDONE 0.5 MG PO TABS
0.5000 mg | ORAL_TABLET | Freq: Every day | ORAL | Status: DC
  Administered 2018-09-15 (×2): 0.5 mg via ORAL
  Filled 2018-09-14 (×2): qty 1

## 2018-09-14 MED ORDER — INSULIN GLARGINE 100 UNIT/ML ~~LOC~~ SOLN
20.0000 [IU] | Freq: Every day | SUBCUTANEOUS | Status: DC
  Administered 2018-09-14: 20 [IU] via SUBCUTANEOUS
  Filled 2018-09-14 (×4): qty 0.2

## 2018-09-14 MED ORDER — GABAPENTIN 300 MG PO CAPS
300.0000 mg | ORAL_CAPSULE | Freq: Once | ORAL | Status: AC
  Administered 2018-09-15: 300 mg via ORAL
  Filled 2018-09-14: qty 1

## 2018-09-14 NOTE — ED Notes (Signed)
Pt given meal tray.

## 2018-09-14 NOTE — ED Notes (Signed)
Pt given meal tray at this time 

## 2018-09-14 NOTE — ED Notes (Signed)
Pt assisted to bedside commode by sitter.

## 2018-09-14 NOTE — ED Notes (Signed)
Patient to restroom with sitter

## 2018-09-14 NOTE — BH Assessment (Signed)
[name] Assessment Progress Note   Patient was seen for reassessment.  Patient states that she is not experiencing any pain issues today and she states that because of this that she is not feeling suicidal today.  Patient states that when she is in pain that she feels suicidal.  Patient states, however, that she continues to hear voices, non-command in nature and she states that she continues to feel demons inside of her.  Patient states that these demons will not leave her alone and that they follow her and she states that they keep her from sleeping.  Patient continues to be in need of inpatient gero-psych treatment.

## 2018-09-14 NOTE — ED Notes (Signed)
Pt screaming out of the room stating she needs to speak with the doctor now. MD [place]ner notified.

## 2018-09-14 NOTE — ED Notes (Signed)
Pt ate all of dinner tray.

## 2018-09-15 ENCOUNTER — Observation Stay (HOSPITAL_COMMUNITY): Payer: Medicare Other

## 2018-09-15 ENCOUNTER — Emergency Department (HOSPITAL_COMMUNITY): Payer: Medicare Other

## 2018-09-15 ENCOUNTER — Observation Stay (HOSPITAL_BASED_OUTPATIENT_CLINIC_OR_DEPARTMENT_OTHER): Payer: Medicare Other

## 2018-09-15 ENCOUNTER — Other Ambulatory Visit: Payer: Self-pay

## 2018-09-15 ENCOUNTER — Observation Stay (HOSPITAL_COMMUNITY)
Admit: 2018-09-15 | Discharge: 2018-09-15 | Disposition: A | Discharge status: Home / Self Care | Payer: Medicare Other | Attending: Internal Medicine | Admitting: Internal Medicine

## 2018-09-15 DIAGNOSIS — N390 Urinary tract infection, site not specified: Secondary | ICD-10-CM | POA: Diagnosis not present

## 2018-09-15 DIAGNOSIS — I6621 Occlusion and stenosis of right posterior cerebral artery: Secondary | ICD-10-CM | POA: Diagnosis not present

## 2018-09-15 DIAGNOSIS — T1491XA Suicide attempt, initial encounter: Secondary | ICD-10-CM | POA: Diagnosis not present

## 2018-09-15 DIAGNOSIS — R0989 Other specified symptoms and signs involving the circulatory and respiratory systems: Secondary | ICD-10-CM | POA: Diagnosis not present

## 2018-09-15 DIAGNOSIS — I639 Cerebral infarction, unspecified: Secondary | ICD-10-CM | POA: Diagnosis not present

## 2018-09-15 DIAGNOSIS — G9340 Encephalopathy, unspecified: Secondary | ICD-10-CM

## 2018-09-15 DIAGNOSIS — R4781 Slurred speech: Secondary | ICD-10-CM | POA: Diagnosis not present

## 2018-09-15 DIAGNOSIS — R4182 Altered mental status, unspecified: Secondary | ICD-10-CM | POA: Diagnosis not present

## 2018-09-15 DIAGNOSIS — I6523 Occlusion and stenosis of bilateral carotid arteries: Secondary | ICD-10-CM | POA: Diagnosis not present

## 2018-09-15 DIAGNOSIS — R4020 Unspecified coma: Secondary | ICD-10-CM | POA: Diagnosis not present

## 2018-09-15 LAB — DIFFERENTIAL
Abs Immature Granulocytes: 0.04 10*3/uL (ref 0.00–0.07)
Basophils Absolute: 0 10*3/uL (ref 0.0–0.1)
Basophils Relative: 1 %
Eosinophils Absolute: 0.3 10*3/uL (ref 0.0–0.5)
Eosinophils Relative: 5 %
Immature Granulocytes: 1 %
Lymphocytes Relative: 19 %
Lymphs Abs: 1.2 10*3/uL (ref 0.7–4.0)
Monocytes Absolute: 0.6 10*3/uL (ref 0.1–1.0)
Monocytes Relative: 9 %
Neutro Abs: 4.1 10*3/uL (ref 1.7–7.7)
Neutrophils Relative %: 65 %

## 2018-09-15 LAB — CBC
HCT: 25.1 % — ABNORMAL LOW (ref 36.0–46.0)
Hemoglobin: 8.2 g/dL — ABNORMAL LOW (ref 12.0–15.0)
MCH: 31.7 pg (ref 26.0–34.0)
MCHC: 32.7 g/dL (ref 30.0–36.0)
MCV: 96.9 fL (ref 80.0–100.0)
Platelets: 152 10*3/uL (ref 150–400)
RBC: 2.59 MIL/uL — ABNORMAL LOW (ref 3.87–5.11)
RDW: 13.7 % (ref 11.5–15.5)
WBC: 6.2 10*3/uL (ref 4.0–10.5)
nRBC: 0 % (ref 0.0–0.2)

## 2018-09-15 LAB — COMPREHENSIVE METABOLIC PANEL
ALT: 23 U/L (ref 0–44)
AST: 20 U/L (ref 15–41)
Albumin: 2.8 g/dL — ABNORMAL LOW (ref 3.5–5.0)
Alkaline Phosphatase: 74 U/L (ref 38–126)
Anion gap: 7 (ref 5–15)
BUN: 36 mg/dL — ABNORMAL HIGH (ref 8–23)
CO2: 23 mmol/L (ref 22–32)
Calcium: 8.6 mg/dL — ABNORMAL LOW (ref 8.9–10.3)
Chloride: 110 mmol/L (ref 98–111)
Creatinine, Ser: 2.28 mg/dL — ABNORMAL HIGH (ref 0.44–1.00)
GFR calc Af Amer: 25 mL/min — ABNORMAL LOW (ref >60)
GFR calc non Af Amer: 22 mL/min — ABNORMAL LOW (ref >60)
Glucose, Bld: 163 mg/dL — ABNORMAL HIGH (ref 70–99)
Potassium: 3.7 mmol/L (ref 3.5–5.1)
Sodium: 140 mmol/L (ref 135–145)
Total Bilirubin: 0.3 mg/dL (ref 0.3–1.2)
Total Protein: 6.3 g/dL — ABNORMAL LOW (ref 6.5–8.1)

## 2018-09-15 LAB — TSH: TSH: 3.26 u[IU]/mL (ref 0.350–4.500)

## 2018-09-15 LAB — ECHOCARDIOGRAM COMPLETE
Height: 63 in
Weight: 3499.14 oz

## 2018-09-15 LAB — I-STAT TROPONIN, ED
Troponin i, poc: 0 ng/mL (ref 0.00–0.08)
Troponin i, poc: 0 ng/mL (ref 0.00–0.08)

## 2018-09-15 LAB — CBG MONITORING, ED
Glucose-Capillary: 181 mg/dL — ABNORMAL HIGH (ref 70–99)
Glucose-Capillary: 147 mg/dL — ABNORMAL HIGH (ref 70–99)

## 2018-09-15 LAB — URINE CULTURE: Culture: >=100000 — AB

## 2018-09-15 LAB — APTT: aPTT: 30 seconds (ref 24–36)

## 2018-09-15 LAB — GLUCOSE, CAPILLARY
Glucose-Capillary: 129 mg/dL — ABNORMAL HIGH (ref 70–99)
Glucose-Capillary: 142 mg/dL — ABNORMAL HIGH (ref 70–99)
Glucose-Capillary: 128 mg/dL — ABNORMAL HIGH (ref 70–99)

## 2018-09-15 LAB — TROPONIN I: Troponin I: <0.03 ng/mL (ref <0.03)

## 2018-09-15 LAB — PROTIME-INR
INR: 1.06
Prothrombin Time: 13.7 seconds (ref 11.4–15.2)

## 2018-09-15 LAB — CK: Total CK: 73 U/L (ref 38–234)

## 2018-09-15 LAB — AMMONIA: Ammonia: 20 umol/L (ref 9–35)

## 2018-09-15 MED ORDER — ORAL CARE MOUTH RINSE
15.0000 mL | Freq: Two times a day (BID) | OROMUCOSAL | Status: DC
  Administered 2018-09-17 – 2018-09-19 (×5): 15 mL via OROMUCOSAL

## 2018-09-15 MED ORDER — GABAPENTIN 300 MG PO CAPS
300.0000 mg | ORAL_CAPSULE | Freq: Once | ORAL | Status: AC
  Administered 2018-09-15: 300 mg via ORAL
  Filled 2018-09-15: qty 1

## 2018-09-15 MED ORDER — MONTELUKAST SODIUM 10 MG PO TABS: 10.0000 mg | ORAL_TABLET | Freq: Every day | ORAL | Status: DC

## 2018-09-15 MED ORDER — INSULIN ASPART 100 UNIT/ML ~~LOC~~ SOLN
0.0000 [IU] | Freq: Every day | SUBCUTANEOUS | Status: DC
  Administered 2018-09-17: 2 [IU] via SUBCUTANEOUS

## 2018-09-15 MED ORDER — STROKE: EARLY STAGES OF RECOVERY BOOK
Freq: Once | Status: AC
  Administered 2018-09-15: 14:00:00
  Filled 2018-09-15 (×2): qty 1

## 2018-09-15 MED ORDER — ASPIRIN 300 MG RE SUPP
300.0000 mg | Freq: Every day | RECTAL | Status: DC
  Filled 2018-09-15: qty 1

## 2018-09-15 MED ORDER — HYDRALAZINE HCL 20 MG/ML IJ SOLN: 10.0000 mg | INTRAMUSCULAR | Status: DC | PRN

## 2018-09-15 MED ORDER — INSULIN ASPART 100 UNIT/ML ~~LOC~~ SOLN
0.0000 [IU] | Freq: Four times a day (QID) | SUBCUTANEOUS | Status: DC
  Administered 2018-09-15: 1 [IU] via SUBCUTANEOUS

## 2018-09-15 MED ORDER — ACETAMINOPHEN 650 MG RE SUPP: 650.0000 mg | RECTAL | Status: DC | PRN

## 2018-09-15 MED ORDER — IOPAMIDOL (ISOVUE-370) INJECTION 76%
100.0000 mL | Freq: Once | INTRAVENOUS | Status: AC | PRN
  Administered 2018-09-15: 100 mL via INTRAVENOUS

## 2018-09-15 MED ORDER — ACETAMINOPHEN 325 MG PO TABS: 650.0000 mg | ORAL_TABLET | ORAL | Status: DC | PRN

## 2018-09-15 MED ORDER — NYSTATIN 100000 UNIT/ML MT SUSP: 5.0000 mL | Freq: Three times a day (TID) | OROMUCOSAL | Status: DC

## 2018-09-15 MED ORDER — HEPARIN SODIUM (PORCINE) 5000 UNIT/ML IJ SOLN
5000.0000 [IU] | Freq: Three times a day (TID) | INTRAMUSCULAR | Status: DC
  Administered 2018-09-15 – 2018-09-19 (×13): 5000 [IU] via SUBCUTANEOUS
  Filled 2018-09-15 (×13): qty 1

## 2018-09-15 MED ORDER — LEVOTHYROXINE SODIUM 100 MCG/5ML IV SOLN
12.5000 ug | Freq: Every day | INTRAVENOUS | Status: DC
  Administered 2018-09-15 – 2018-09-17 (×3): 12.5 ug via INTRAVENOUS
  Filled 2018-09-15 (×3): qty 5

## 2018-09-15 MED ORDER — CHLORHEXIDINE GLUCONATE 0.12 % MT SOLN
15.0000 mL | Freq: Two times a day (BID) | OROMUCOSAL | Status: DC
  Administered 2018-09-15 – 2018-09-19 (×9): 15 mL via OROMUCOSAL
  Filled 2018-09-15 (×9): qty 15

## 2018-09-15 MED ORDER — IPRATROPIUM-ALBUTEROL 0.5-2.5 (3) MG/3ML IN SOLN
3.0000 mL | Freq: Once | RESPIRATORY_TRACT | Status: AC
  Administered 2018-09-15: 3 mL via RESPIRATORY_TRACT
  Filled 2018-09-15: qty 3

## 2018-09-15 MED ORDER — ASPIRIN 325 MG PO TABS
325.0000 mg | ORAL_TABLET | Freq: Every day | ORAL | Status: DC
  Administered 2018-09-15 – 2018-09-17 (×3): 325 mg via ORAL
  Filled 2018-09-15 (×3): qty 1

## 2018-09-15 MED ORDER — ALBUTEROL SULFATE (2.5 MG/3ML) 0.083% IN NEBU
2.5000 mg | INHALATION_SOLUTION | Freq: Four times a day (QID) | RESPIRATORY_TRACT | Status: DC | PRN
  Administered 2018-09-18 – 2018-09-19 (×2): 2.5 mg via RESPIRATORY_TRACT
  Filled 2018-09-15 (×2): qty 3

## 2018-09-15 MED ORDER — SODIUM CHLORIDE 0.9 % IV SOLN
INTRAVENOUS | Status: DC
  Administered 2018-09-15 – 2018-09-16 (×2): via INTRAVENOUS

## 2018-09-15 MED ORDER — KETOROLAC TROMETHAMINE 30 MG/ML IJ SOLN
30.0000 mg | Freq: Once | INTRAMUSCULAR | Status: AC
  Administered 2018-09-15: 30 mg via INTRAVENOUS
  Filled 2018-09-15: qty 1

## 2018-09-15 MED ORDER — SODIUM CHLORIDE 0.9% FLUSH
3.0000 mL | Freq: Once | INTRAVENOUS | Status: AC
  Administered 2018-09-15: 3 mL via INTRAVENOUS

## 2018-09-15 MED ORDER — ATORVASTATIN CALCIUM 40 MG PO TABS
40.0000 mg | ORAL_TABLET | Freq: Every day | ORAL | Status: DC
  Administered 2018-09-15 – 2018-09-18 (×4): 40 mg via ORAL
  Filled 2018-09-15 (×4): qty 1

## 2018-09-15 MED ORDER — GABAPENTIN 300 MG PO CAPS: 300.0000 mg | ORAL_CAPSULE | Freq: Two times a day (BID) | ORAL | Status: DC

## 2018-09-15 MED ORDER — ACETAMINOPHEN 160 MG/5ML PO SOLN: 650.0000 mg | ORAL | Status: DC | PRN

## 2018-09-15 MED ORDER — DULAGLUTIDE 1.5 MG/0.5ML ~~LOC~~ SOAJ: 1.5000 mg | SUBCUTANEOUS | Status: DC

## 2018-09-15 NOTE — Care Management Obs Status (Signed)
[facility] NOTIFICATION   Patient Details  Name: [name] MRN: [id number] Date of Birth: [birth year]   Medicare Observation Status Notification Given:  Yes    [name] 09/15/2018, 2:19 PM

## 2018-09-15 NOTE — Progress Notes (Signed)
Code Stroke Time  943 called time Not beeped 0910 exam started 2761 Exam finished. Images sent, exam completed 0934 [facility] called   * time was delayed bc [facility] neuro rad was consulted prior to proceeding with initial ordered CT Head wo, Dr. Carlis Abbott recommended MRI Brain. Teleneuro consulted and ordered CTA/Perfusion.

## 2018-09-15 NOTE — Procedures (Signed)
ELECTROENCEPHALOGRAM REPORT   Patient: [name]       Room #: IC01 EEG No. ID: [id number] Age: 68 y.o.        Sex: female Referring Physician: Manuella Ghazi Report Date:  09/15/2018        Interpreting Physician: Alexis Goodell  History: [name] is an 68 y.o. female with altered mental status  Medications:  ASA, Lipitor, Insulin, Synthroid, Risperdal  Conditions of Recording:  This is a 21 channel routine scalp EEG performed with bipolar and monopolar montages arranged in accordance to the international 10/20 system of electrode placement. One channel was dedicated to EKG recording.  The patient is in the awake, drowsy and asleep states.  Description:  The waking background activity consists of a low voltage, symmetrical, fairly well organized, 8 Hz alpha activity, seen from the parieto-occipital and posterior temporal regions.  Low voltage fast activity, poorly organized, is seen anteriorly and is at times superimposed on more posterior regions.  A mixture of theta and alpha rhythms are seen from the central and temporal regions. The patient drowses with slowing to irregular, low voltage theta and beta activity.   The patient goes in to a light sleep with symmetrical sleep spindles, vertex central sharp transients and irregular slow activity.  No epileptiform activity is noted.   Hyperventilation and intermittent photic stimulation were not performed.   IMPRESSION: Normal electroencephalogram, awake and asleep. There are no focal lateralizing or epileptiform features.   Alexis Goodell, MD Neurology [phone] 09/15/2018, 3:05 PM

## 2018-09-15 NOTE — Progress Notes (Signed)
EEG completed; results pending.    

## 2018-09-15 NOTE — Consult Note (Addendum)
[facility] Tele[facility]urology Consult Services   Date of Service:   09/15/2018 09:15:28  Impression:     .  RO Acute Ischemic Stroke  Comments: acute onset slurred speech followed by a spell of unresponsiveness - DDx includes acute stroke (unclear localization - basilar vs L frontal) vs encephalopathy vs psychogenic symptoms. Outside tpa window. CTA/P head/neck pending. Agree with MRI brain. She is more awake now. Recommend stat EEG as well.  Mechanism of Stroke: Not Clear  Metrics: Last Known Well: Unknown [facility] Notification Time: 09/15/2018 09:15:28 Stamp Time: 09/15/2018 09:15:28 Time First Login Attempt: 09/15/2018 09:23:46 Video Start Time: 09/15/2018 09:23:46  Symptoms: unresponsiveness and slurred speech. NIHSS Start Assessment Time: 09/15/2018 09:29:15 Patient is not a candidate for tPA. Patient was not deemed candidate for tPA thrombolytics because of Last Well Known Above 4.5 Hours. Video End Time: 09/15/2018 09:35:16  CT head showed no acute hemorrhage or acute core infarct.  Advanced imaging CTA head and neck obtained. Advanced imaging CTP obtained.  CTA/P head/neck negative; no role for thrombectomy.  Our recommendations are outlined below.  Recommendations:     .  Activate Stroke Protocol Admission/Order Set     .  Stroke/Telemetry Floor     .  Neuro Checks     .  Bedside Swallow Eval     .  DVT Prophylaxis     .  IV Fluids, Normal Saline     .  Head of Bed Below 30 Degrees     .  Euglycemia and Avoid Hyperthermia (PRN Acetaminophen)     .  start ASA if CT head is neg for hemorrhage.  Routine Consultation with [facility] for Follow up Care  Sign Out:     .  Discussed with Emergency Department Provider    ------------------------------------------------------------------------------  History of Present Illness: Patient is a 68 year old Female.  Inpatient stroke alert was called for symptoms of unresponsiveness  and slurred speech.  68 yo woman with a hx of schizoaffective disorder came in yesterday for suicide attempt. She was noted to have slurred speech this am at approx 0700. LSN is not clear. Speech improved, but then at approx 0810, she was not speaking at all. She has had similar episodes in the past. She had a questionable seizure earlier this month requiring intubation. No witnessed convulsion this am. She takes ASA but no other blood thinners.  CT head showed no acute hemorrhage or acute core infarct.    Examination: 1A: Level of Consciousness - Alert; keenly responsive + 0 1B: Ask Month and Age - Could Not Answer Either Question Correctly + 2 1C: Blink Eyes & Squeeze Hands - Performs Both Tasks + 0 2: Test Horizontal Extraocular Movements - Normal + 0 3: Test Visual Fields - No Visual Loss + 0 4: Test Facial Palsy (Use Grimace if Obtunded) - Normal symmetry + 0 5A: Test Left Arm Motor Drift - No Drift for 10 Seconds + 0 5B: Test Right Arm Motor Drift - No Drift for 10 Seconds + 0 6A: Test Left Leg Motor Drift - Some Effort Against Gravity + 2 6B: Test Right Leg Motor Drift - No Drift for 5 Seconds + 0 7: Test Limb Ataxia (FNF/Heel-Shin) - No Ataxia + 0 8: Test Sensation - Normal; No sensory loss + 0 9: Test Language/Aphasia - Severe Aphasia: Fragmentary Expression, Inference Needed, Cannot Identify Materials + 2 10: Test Dysarthria - Severe Dysarthria: Unintelligble Slurring or Out of Proportion to Aphasia + 2 11: Test  Extinction/Inattention - No abnormality + 0  NIHSS Score: 8  Patient was informed the Neurology Consult would happen via TeleHealth consult by way of interactive audio and video telecommunications and consented to receiving care in this manner.  Due to the immediate potential for life-threatening deterioration due to underlying acute neurologic illness, I spent 35 minutes providing critical care. This time includes time for face to face visit via telemedicine, review of  medical records, imaging studies and discussion of findings with providers, the patient and/or family.   Dr Hal Morales   [facility] [phone]  Case [id number]

## 2018-09-15 NOTE — Progress Notes (Signed)
When pt arrived to ICU unit at 1154 pt was lethargic and was disoriented X4 pt would not speak, would not attempt to look at any RN when spoken to, only withdrew from pain. Pt would not follow commands when asked to raise arms or move feet only stared at walls. Sitter came and got this RN at 1400 and told me pt was now alert and oriented pt answered all orientation questions correctly physical therapy came and sat patient up on side of bed pt was talking to [place] but did verbalize that she see's demons and that they are telling her to kill herself and for [place] not to let them get her. This RN gave her medicines and did the swallow test with her. Pt swallowed just fine and did not cough. This RN will continue to monitor pt.

## 2018-09-15 NOTE — ED Notes (Signed)
Dr. Reather Converse reports that at 0730 when pt was assessed by him pt had mild slurring of speech. With stimulation pt became more arousable and her speech became clear and they were able to have a conversation. Tele-stroke RN notified and will relay message to tele-neurologist.

## 2018-09-15 NOTE — ED Notes (Signed)
Pt placed on 2L O2 via [place] due to sats dropping down into 80's while sleeping. O2 sat increased to 93% after O2 applied. Pt resting quietly.

## 2018-09-15 NOTE — Evaluation (Signed)
Physical Therapy Evaluation Patient Details Name: [name] MRN: [id number] DOB: 01/28/1951 Today's Date: 09/15/2018   History of Present Illness  [name] is a 68 y.o. female with medical history significant for schizoaffective disorder, CKD stage IV, chronic anemia, hypertension, dyslipidemia, type 2 diabetes, hypothyroidism, obesity, prior CVA, and questionable prior seizure activity who was brought to the ED on 1/29 from her assisted living facility where she was found by the nurse with a plastic bag around her head.  It appears that she had this bag around her head and an attempt to harm herself and stated to the nurse at that time that she was seeing demons and monsters that did not leave her alone.  She continued to have hallucinations with potential suicidal ideation for which she was initially given some Haldol and was assessed by psychiatry.  She awaited bed placement to inpatient psychiatry in the ED and was started on Risperdal 0.5 mg twice a day for brief psychotic disorder.  She had remained stable in the ED until this morning at which point she became much less responsive and had some slurred speech for which she underwent stroke evaluation.  She underwent multiple imaging studies with CTA as well as perfusion study with no acute findings noted.  Tele-neurology was consulted and recommended further evaluation with EEG and brain MRI.    Clinical Impression  Patient presents supine in bed with head of bed elevated, aide in room, and 2 nurses assessing patient. Patient agreeable to therapy, reports demons behind therapist and they are "mad because they don't want you to help me get better". Therapist assures patient the demons won't get her. Patient requires increased time with bed mobility demonstrating slow, labored movement, use of bedrail and trunk and BLE management. Patient unable to stand up from bed or ambulate due to pain complaints in BLE and chest. RN reports this is the  first time patient is able to follow commands and interacts appropriately today. Patient inconsistent with manual muscle testing when seated edge of bed, despite constant education on task. Patient limited by fatigue and pain complaints. Patient left supine in bed with call bell in reach, aide in room, RN in room, and ECHO entering room for further testing. Patient will benefit from continued physical therapy in hospital and recommended venue below to increase strength, balance, endurance for safe ADLs and gait.     Follow Up Recommendations SNF    Equipment Recommendations  None recommended by PT    Recommendations for Other Services       Precautions / Restrictions Precautions Precautions: Fall Restrictions Weight Bearing Restrictions: No      Mobility  Bed Mobility Overal bed mobility: Needs Assistance Bed Mobility: Supine to Sit;Sit to Supine     Supine to sit: Min assist Sit to supine: Min assist   General bed mobility comments: increased time, use of bed rail, elevated HOB  Transfers                 General transfer comment: unable to assess secondary to chest and BLE pain complaints with mobility  Ambulation/Gait             General Gait Details: unable to assess secondary to chest and BLE pain complaints with mobility  Stairs            Wheelchair Mobility    Modified Rankin (Stroke Patients Only)       Balance Overall balance assessment: Needs assistance Sitting-balance support: Feet supported;Bilateral upper extremity  supported Sitting balance-Leahy Scale: Fair Sitting balance - Comments: seated edge of bed                                     Pertinent Vitals/Pain Pain Assessment: Faces Pain Score: 10-Worst pain ever Faces Pain Scale: Hurts little more Pain Location: chest and BLE Pain Descriptors / Indicators: Aching;Sharp;Sore Pain Intervention(s): Limited activity within patient's tolerance;Monitored during  session;Repositioned    Home Living Family/patient expects to be discharged to:: [place]: Other (Comment)(Nursing home with aides assisting)               Additional Comments: Patient reports living at [facility] and plans to return once medically stable    Prior Function Level of Independence: Needs assistance   Gait / Transfers Assistance Needed: Patient reports she primarily uses WC, can't remember the last time she walked and has aides assist her with transfers  ADL's / Homemaking Assistance Needed: Patient reports she needs assistance with ADLs, but is able to feed self        Hand Dominance        Extremity/Trunk Assessment   Upper Extremity Assessment Upper Extremity Assessment: Generalized weakness    Lower Extremity Assessment Lower Extremity Assessment: RLE deficits/detail;LLE deficits/detail(Patient inconsistent with MMT, but L consistently stronger than R) RLE Deficits / Details: inconsistent, ranging from 2+/5 to 3/5 unable to complete full AROM agaist gravity at times RLE Sensation: decreased light touch(Patient reports neuropathy and this is baseline) RLE Coordination: WNL LLE Deficits / Details: inconsistent, ranging from 3+/5 to 4+/5 tolerating different amounts of resistance LLE Sensation: decreased light touch(Patient reports neuropathy and this is baseline) LLE Coordination: WNL    Cervical / Trunk Assessment Cervical / Trunk Assessment: Normal  Communication   Communication: No difficulties  Cognition Arousal/Alertness: Awake/alert Behavior During Therapy: Flat affect Overall Cognitive Status: Within Functional Limits for tasks assessed                                 General Comments: Per nursing, patient appears to be at baseline as of last 30 minutes and is able to follow commands and answer questions appropriately; patient is alert and oriented x4      General Comments      Exercises      Assessment/Plan    PT Assessment Patient needs continued PT services  PT Problem List Decreased strength;Decreased activity tolerance;Decreased balance;Decreased mobility       PT Treatment Interventions Gait training;Functional mobility training;Therapeutic activities;Therapeutic exercise;Patient/family education    PT Goals (Current goals can be found in the Care Plan section)  Acute Rehab PT Goals Patient Stated Goal: return to [facility] once medically stable PT Goal Formulation: With patient Time For Goal Achievement: 09/29/18 Potential to Achieve Goals: Fair    Frequency Min 3X/week   Barriers to discharge        Co-evaluation               AM-PAC PT "6 Clicks" Mobility  Outcome Measure Help needed turning from your back to your side while in a flat bed without using bedrails?: A Little Help needed moving from lying on your back to sitting on the side of a flat bed without using bedrails?: A Little Help needed moving to and from a bed to a chair (including a wheelchair)?: Total  Help needed standing up from a chair using your arms (e.g., wheelchair or bedside chair)?: Total Help needed to walk in hospital room?: Total Help needed climbing 3-5 steps with a railing? : Total 6 Click Score: 10    End of Session   Activity Tolerance: Patient limited by fatigue;Patient limited by pain Patient left: in bed;with call bell/phone within reach;with nursing/sitter in room(Aide and RN at bedside) Nurse Communication: Mobility status PT Visit Diagnosis: Other abnormalities of gait and mobility (R26.89);Muscle weakness (generalized) (M62.81)    Time: 6116-4353 PT Time Calculation (min) (ACUTE ONLY): 18 min   Charges:   PT Evaluation $PT Eval Moderate Complexity: 1 Mod PT Treatments $Therapeutic Activity: 8-22 mins        3:09 PM, 09/15/18 Talbot Grumbling, DPT Physical Therapist with [facility] [phone] office

## 2018-09-15 NOTE — ED Notes (Signed)
Pt wheezing and coughing, edp notified and orders received.

## 2018-09-15 NOTE — Progress Notes (Signed)
SLP Cancellation Note  Patient Details Name: [name] MRN: [id number] DOB: 08/07/1951   Cancelled treatment:       Reason Eval/Treat Not Completed: Fatigue/lethargy limiting ability to participate;Patient's level of consciousness. Pt reportedly comes "in and out" according to sitter and was not alert enough to participate in BSE. ST will continue to follow and will re-attempt when Pt is appropriate.  Amelia H. Roddie Mc, CCC-SLP Speech Language Pathologist   Wende Bushy 09/15/2018, 6:11 PM

## 2018-09-15 NOTE — Plan of Care (Signed)
  Problem: Acute Rehab PT Goals(only PT should resolve) Goal: Pt Will Go Sit To Supine/Side Outcome: Progressing Flowsheets (Taken 09/15/2018 1510) Pt will go Sit to Supine/Side: with modified independence Goal: Patient Will Transfer Sit To/From Stand Outcome: Progressing Flowsheets (Taken 09/15/2018 1510) Patient will transfer sit to/from stand: with minimal assist Note:  With RW Goal: Pt Will Transfer Bed To Chair/Chair To Bed Outcome: Progressing Flowsheets (Taken 09/15/2018 1510) Pt will Transfer Bed to Chair/Chair to Bed: with min assist Note:  With RW Goal: Pt Will Ambulate Outcome: Progressing Flowsheets (Taken 09/15/2018 1510) Pt will Ambulate: 15 feet; with minimal assist; with rolling walker Goal: PT Additional Goal #1 Outcome: Progressing Flowsheets (Taken 09/15/2018 1510) Additional Goal #1: Pt will self propel WC using BLE/BUE for 50 ft in order to navigate living environment   3:11 PM, 09/15/18 Talbot Grumbling, DPT Physical Therapist with [facility] [phone] office

## 2018-09-15 NOTE — ED Notes (Signed)
RN went to assess pt. Pt laying in bed and when talking, pt appears to have slurred speech. Unknown if this is pt's normal. Pt states, "I am having trouble talking. Something is wrong with my speech". RN spoke with sitter to see if this was different than pt's normal. Sitter reports she hasn't heard her talking this morning since she arrived at 5075 so she was uncertain. Pt has equal grips bilaterally. No arm droop. Face symmetrical. Dr. Reather Converse notified. CT head ordered.

## 2018-09-15 NOTE — ED Notes (Signed)
Pt lethargic, unable to stay awake. Pt failed stroke swallow screen x 1. Pt kept NPO.

## 2018-09-15 NOTE — Progress Notes (Signed)
Patient resting in no apparent distress with RR and effort WDP. -sitter at the bedside

## 2018-09-15 NOTE — ED Provider Notes (Signed)
I was informed patient was experiencing difficulty breathing and went to evaluate her.  Her oxygen saturations are 98% and she is in no distress.  Chest x-ray shows cardiomegaly but no other abnormality.  She does not appear to be in heart failure.  She was given a nebulizer treatment with some relief.  Later I was informed the patient was experiencing chest discomfort.  Patient has difficulty explaining her symptoms.  An EKG was obtained revealing a sinus rhythm with a right bundle branch block and is unchanged from prior studies.  Troponin has been ordered and is pending.   Veryl Speak, MD 09/15/18 [phone]

## 2018-09-15 NOTE — ED Notes (Signed)
Pt having chest pain she describes as stabbing. Vitals and ekg done. EDP notified and further orders received.

## 2018-09-15 NOTE — ED Notes (Signed)
Pt will open eyes to speech but is nonverbal and does not follow commands.

## 2018-09-15 NOTE — Progress Notes (Signed)
Patient reports BLE discomfort at this time and asks for Neurontin (which she take for neuropathy. -there is not order at current -provider made aware of the patient's request via [facility]ION

## 2018-09-15 NOTE — H&P (Signed)
History and Physical    Fia Hebert CXK:[id number] DOB: Sep 21, 1950 DOA: 09/12/2018  PCP: Glenda Chroman, MD   Patient coming from: ALF  Chief Complaint: Hallucinations and suicide attempt with now AMS  HPI: Magdala Brahmbhatt is a 68 y.o. female with medical history significant for schizoaffective disorder, CKD stage IV, chronic anemia, hypertension, dyslipidemia, type 2 diabetes, hypothyroidism, obesity, prior CVA, and questionable prior seizure activity who was brought to the ED on 1/29 from her assisted living facility where she was found by the nurse with a plastic bag around her head.  It appears that she had this bag around her head and an attempt to harm herself and stated to the nurse at that time that she was seeing demons and monsters that did not leave her alone.  She continued to have hallucinations with potential suicidal ideation for which she was initially given some Haldol and was assessed by psychiatry.  She awaited bed placement to inpatient psychiatry in the ED and was started on Risperdal 0.5 mg twice a day for brief psychotic disorder.  She had remained stable in the ED until this morning at which point she became much less responsive and had some slurred speech for which she underwent stroke evaluation.  She underwent multiple imaging studies with CTA as well as perfusion study with no acute findings noted.  Tele-neurology was consulted and recommended further evaluation with EEG and brain MRI.   ED Course: Vital signs are stable and lab work is stable for chronic anemia as well as CKD stage IV.  Imaging studies with no significant findings noted.  Patient is currently alert and awake, but unresponsive to any questioning.  Recommendations from tele-neurology are for observation to ensure that there are no seizures or stroke prior to reconsideration for discharge to inpatient psychiatry.  No family members or friends are at bedside.  No incontinence of stool or urine noted, nor has  there been any tonic-clonic activity or tongue biting by sitters near bedside.  Review of Systems: Cannot be obtained due to patient condition.  Past Medical History:  Diagnosis Date  . Anxiety   . Asthma   . Back pain   . Barrett esophagus   . CHF (congestive heart failure) ([place])   . CKD (chronic kidney disease) stage 3, GFR 30-59 ml/min (HCC)   . COPD (chronic obstructive pulmonary disease) ([place])   . Depression   . Diastolic heart failure ([place])   . Essential hypertension   . Fibromyalgia   . GERD (gastroesophageal reflux disease)   . Gout   . History of pericarditis   . Hyperlipidemia   . Hypothyroid   . Major depressive disorder   . Memory loss   . Nephrolithiasis    2008  . Obesity hypoventilation syndrome (HCC)    CPAP  . Obstructive sleep apnea   . PE (pulmonary embolism)    2010  . Peripheral neuropathy   . PTSD (post-traumatic stress disorder)   . Rheumatoid arthritis ([place])   . Schizophrenia ([place])   . Steroid dependence   . Type 2 diabetes mellitus ([place])   . Vitamin D deficiency     Past Surgical History:  Procedure Laterality Date  . APPENDECTOMY    . CHOLECYSTECTOMY    . COLONOSCOPY     benign polyps (12/2000), repeat colonoscopy performed in 2007  . ENDOMETRIAL BIOPSY     10/03 benign columnar mucosa (limited material)  . KIDNEY SURGERY       reports that she  quit smoking about 46 years ago. Her smoking use included cigarettes. She has never used smokeless tobacco. She reports that she does not drink alcohol or use drugs.  Allergies  Allergen Reactions  . Benztropine Mesylate Other (See Comments)    Alopecia and white spots on eyes  . Codeine Swelling, Palpitations and Other (See Comments)    Mouth Swelling and Tachycardia   . Diazepam Other (See Comments)    COMA  . Diphenhydramine Hcl Anxiety, Palpitations and Other (See Comments)    Tachycardia and anxiousness  . Penicillins Other (See Comments)    REACTION: ulcers in mouth, swelling,  throat swelling  Has patient had a PCN reaction causing immediate rash, facial/tongue/throat swelling, SOB or lightheadedness with hypotension: YES Has patient had a PCN reaction causing severe rash involving Mucus membranes or skin necrosis: Unknown Has patient had a PCN reaction that required hospitalization Unknown Has patient had a PCN reaction occurring within the last 10 years: unknown If all of the above answers are "NO", then may proceed with Cephalospori  . Sulfonamide Derivatives Rash    Blisters, also  . Thioridazine Hcl Swelling  . Benztropine Other (See Comments)    "Alopecia and white spots on eyes"  . Penicillin G Swelling and Other (See Comments)    Ulcers in mouth & tongue became swollen Has patient had a PCN reaction causing immediate rash, facial/tongue/throat swelling, SOB or lightheadedness with hypotension: Yes Has patient had a PCN reaction causing severe rash involving mucus membranes or skin necrosis: Unk Has patient had a PCN reaction that required hospitalization: Unk Has patient had a PCN reaction occurring within the last 10 years: Unk If all of the above answers are "NO", then may proceed with Cephalosporin use.   [name] Kitchen Lisinopril Cough  . Sulfamethoxazole Rash    Family History  Problem Relation Age of Onset  . Bone cancer Mother   . Hypertension Mother   . Heart disease Mother   . COPD Father   . Heart disease Father   . Stroke Father     Prior to Admission medications   Medication Sig Start Date End Date Taking? Authorizing Provider  acetaminophen (TYLENOL) 325 MG tablet Take 325 mg by mouth 2 (two) times daily.    Yes [provider]  albuterol (PROVENTIL) (2.5 MG/3ML) 0.083% nebulizer solution Take 2.5 mg by nebulization every 6 (six) hours as needed for wheezing.    Yes [provider]  albuterol (VENTOLIN HFA) 108 (90 Base) MCG/ACT inhaler Inhale 1-2 puffs into the lungs every 6 (six) hours as needed for wheezing or shortness of  breath.   Yes [provider]  amLODipine (NORVASC) 5 MG tablet Take 1 tablet (5 mg total) by mouth daily. 07/31/18 07/31/19 Yes Ghimire, Henreitta Leber, MD  aspirin 81 MG tablet Take 81 mg by mouth daily.   Yes [provider]  atorvastatin (LIPITOR) 40 MG tablet Take 40 mg by mouth at bedtime.   Yes [provider]  carvedilol (COREG) 6.25 MG tablet Take 1 tablet (6.25 mg total) by mouth 2 (two) times daily with a meal. 08/18/18  Yes Lavina Hamman, MD  Cholecalciferol (VITAMIN D3) 50 MCG (2000 UT) TABS Take 4,000 Units by mouth daily.   Yes [provider]  docusate sodium (COLACE) 100 MG capsule Take 100 mg by mouth daily.    Yes [provider]  Dulaglutide (TRULICITY) 1.5 CV/8.9FY SOPN Inject 1.5 mg into the skin once a week.   Yes [provider]  EPINEPHrine (EPI-PEN) 0.3 mg/0.3 mL DEVI Inject 0.3 mg into the muscle once as needed (for bee stings, then go to the ED immediately after using).    Yes [provider]  gabapentin (NEURONTIN) 300 MG capsule Take 300 mg by mouth 2 (two) times daily.  08/28/18  Yes [provider]  glipiZIDE (GLUCOTROL XL) 5 MG 24 hr tablet Take 5 mg by mouth 2 (two) times daily.    Yes [provider]  haloperidol (HALDOL) 0.5 MG tablet Take 1 tablet (0.5 mg total) by mouth 2 (two) times daily. 07/31/18  Yes Ghimire, Henreitta Leber, MD  hydrocortisone 2.5 % ointment Apply 1 application topically daily as needed (itchy skin).   Yes [provider]  insulin degludec (TRESIBA FLEXTOUCH) 100 UNIT/ML SOPN FlexTouch Pen Inject 20 Units into the skin at bedtime.   Yes [provider]  levETIRAcetam (KEPPRA) 500 MG tablet Take 1 tablet (500 mg total) by mouth 2 (two) times daily. 08/18/18  Yes Lavina Hamman, MD  levothyroxine (SYNTHROID, LEVOTHROID) 25 MCG tablet Take 1 tablet (25 mcg total) by mouth daily at 6 (six) AM. 08/19/18  Yes Lavina Hamman, MD  linagliptin (TRADJENTA) 5 MG TABS  tablet Take 5 mg by mouth daily.   Yes [provider]  loperamide (IMODIUM) 2 MG capsule Take 2 mg by mouth 3 (three) times daily as needed (for diarrhea).    Yes [provider]  montelukast (SINGULAIR) 10 MG tablet Take 10 mg by mouth at bedtime.   Yes [provider]  Multiple Vitamin (DAILY VITE PO) Take 1 tablet by mouth daily.   Yes [provider]  nystatin (MYCOSTATIN) 100000 UNIT/ML suspension Take 5 mLs by mouth 3 (three) times daily. 10 day course starting on 09/06/2018   Yes [provider]  omeprazole (PRILOSEC) 20 MG capsule Take 20 mg by mouth daily.    Yes [provider]  OVER THE COUNTER MEDICATION CPAP   Yes [provider]  phenazopyridine (PYRIDIUM) 100 MG tablet Take 200 mg by mouth 3 (three) times daily as needed for pain.   Yes [provider]  polyethylene glycol powder (GLYCOLAX/MIRALAX) powder Take 17 g by mouth daily as needed (for constipation).   Yes [provider]  pyridOXINE (VITAMIN B-6) 100 MG tablet Take 200 mg by mouth 2 (two) times daily.   Yes [provider]  spironolactone (ALDACTONE) 25 MG tablet Take 1 tablet (25 mg total) by mouth daily. 04/12/18  Yes Mesner, Corene Cornea, MD  torsemide (DEMADEX) 20 MG tablet Take 60 mg alternating with 40 mg daily. Patient taking differently: Take 40-60 mg by mouth See admin instructions. 60 mg by mouth every other day: alternating with 40 mg by mouth every other day 07/26/18  Yes Strader, [place] M, PA-C  Glycerin, Laxative, (FLEET LIQUID GLYCERIN SUPP RE) Place 1 suppository rectally daily as needed (for constipation).    [provider]    Physical Exam: Vitals:   09/15/18 0930 09/15/18 0945 09/15/18 1000 09/15/18 1030  BP: (!) 162/72 (!) 161/66 (!) 150/70 (!) 146/75  Pulse: 88 82 82 80  Resp: (!) 22 20 (!) 22 (!) 22  Temp:      TempSrc:      SpO2: 90% 92% 92% 96%  Weight:        Constitutional: NAD, unresponsive,  but alert.  Obese. Vitals:   09/15/18 0930 09/15/18 0945 09/15/18 1000 09/15/18 1030  BP: (!) 162/72 (!) 161/66 (!) 150/70 [name] Kitchen)  146/75  Pulse: 88 82 82 80  Resp: (!) 22 20 (!) 22 (!) 22  Temp:      TempSrc:      SpO2: 90% 92% 92% 96%  Weight:       Eyes: lids and conjunctivae normal ENMT: Mucous membranes are moist.  Neck: normal, supple Respiratory: clear to auscultation bilaterally. Normal respiratory effort. No accessory muscle use.  Currently with 2 L nasal cannula. Cardiovascular: Regular rate and rhythm, no murmurs. No extremity edema. Abdomen: no tenderness, no distention. Bowel sounds positive.  Musculoskeletal:  No joint deformity upper and lower extremities.   Skin: no rashes, lesions, ulcers.  Psychiatric: Cannot be evaluated.  Labs on Admission: I have personally reviewed following labs and imaging studies  CBC: Recent Labs  Lab 09/13/18 0038 09/15/18 0854  WBC 6.4 6.2  NEUTROABS 4.5 4.1  HGB 8.6* 8.2*  HCT 25.9* 25.1*  MCV 95.9 96.9  PLT 160 950   Basic Metabolic Panel: Recent Labs  Lab 09/13/18 0038 09/15/18 0854  NA 137 140  K 3.7 3.7  CL 107 110  CO2 24 23  GLUCOSE 224* 163*  BUN 28* 36*  CREATININE 2.19* 2.28*  CALCIUM 8.6* 8.6*   GFR: Estimated Creatinine Clearance: 26.8 mL/min (A) (by C-G formula based on SCr of 2.28 mg/dL (H)). Liver Function Tests: Recent Labs  Lab 09/13/18 0038 09/15/18 0854  AST 18 20  ALT 23 23  ALKPHOS 76 74  BILITOT 0.4 0.3  PROT 6.2* 6.3*  ALBUMIN 3.0* 2.8*   No results for input(s): LIPASE, AMYLASE in the last 168 hours. No results for input(s): AMMONIA in the last 168 hours. Coagulation Profile: Recent Labs  Lab 09/15/18 0854  INR 1.06   Cardiac Enzymes: No results for input(s): CKTOTAL, CKMB, CKMBINDEX, TROPONINI in the last 168 hours. BNP (last 3 results) No results for input(s): PROBNP in the last 8760 hours. HbA1C: No results for input(s): HGBA1C in the last 72 hours. CBG: Recent Labs  Lab  09/14/18 0609 09/14/18 1114 09/14/18 1705 09/15/18 0356 09/15/18 0837  GLUCAP 153* 261* 366* 181* 147*   Lipid Profile: No results for input(s): CHOL, HDL, LDLCALC, TRIG, CHOLHDL, LDLDIRECT in the last 72 hours. Thyroid Function Tests: No results for input(s): TSH, T4TOTAL, FREET4, T3FREE, THYROIDAB in the last 72 hours. Anemia Panel: No results for input(s): VITAMINB12, FOLATE, FERRITIN, TIBC, IRON, RETICCTPCT in the last 72 hours. Urine analysis:    Component Value Date/Time   COLORURINE YELLOW 09/13/2018 0325   APPEARANCEUR CLEAR 09/13/2018 0325   LABSPEC 1.010 09/13/2018 0325   PHURINE 6.0 09/13/2018 0325   GLUCOSEU >=500 (A) 09/13/2018 0325   HGBUR NEGATIVE 09/13/2018 0325   HGBUR negative 10/20/2009 1425   BILIRUBINUR NEGATIVE 09/13/2018 0325   KETONESUR NEGATIVE 09/13/2018 0325   PROTEINUR >=300 (A) 09/13/2018 0325   UROBILINOGEN 0.2 10/05/2011 1002   NITRITE NEGATIVE 09/13/2018 0325   LEUKOCYTESUR NEGATIVE 09/13/2018 0325    Radiological Exams on Admission: Ct Angio Head W Or Wo Contrast  Result Date: 09/15/2018 CLINICAL DATA:  Altered level of consciousness.  Stroke. EXAM: CT ANGIOGRAPHY HEAD AND NECK CT PERFUSION BRAIN TECHNIQUE: Multidetector CT imaging of the head and neck was performed using the standard protocol during bolus administration of intravenous contrast. Multiplanar CT image reconstructions and MIPs were obtained to evaluate the vascular anatomy. Carotid stenosis measurements (when applicable) are obtained utilizing NASCET criteria, using the distal internal carotid diameter as the denominator. Multiphase CT imaging of the brain was performed following IV  bolus contrast injection. Subsequent parametric perfusion maps were calculated using RAPID software. CONTRAST:  199mL ISOVUE-370 IOPAMIDOL (ISOVUE-370) INJECTION 76% COMPARISON:  Noncontrast head CT from earlier today. FINDINGS: CTA NECK FINDINGS Aortic arch: Atherosclerotic calcification.  Three vessel  branching. Right carotid system: Vessels are smooth and widely patent. No noted atheromatous changes. Left carotid system: Vessels are smooth and widely patent. Mild calcified plaque at the ICA bulb. Vertebral arteries: Proximal subclavian arteries are widely patent. Symmetric vertebral arteries that are smooth and widely patent to the dura Skeleton: No acute finding Other neck: No significant incidental finding. Generalized mucosal thickening in the paranasal sinuses. Upper chest: Tracheobronchomalacia with prominent airway narrowing. Bilateral cataract resection. Prominent mediastinal lymph nodes but not pathologic by short axis measurement. Review of the MIP images confirms the above findings CTA HEAD FINDINGS Anterior circulation: Atherosclerotic calcification of the carotid siphons. No branch occlusion, aneurysm, or flow limiting stenosis. Posterior circulation: Symmetric vertebral arteries. The vertebrobasilar arteries are smooth and widely patent. Fetal type right PCA. High-grade narrowing of the distal right P2 segment, stable from 07/27/2018 MRA Venous sinuses: Patent Anatomic variants: As above Delayed phase: Not obtained in the emergent setting Review of the MIP images confirms the above findings CT Brain Perfusion Findings: CBF (<30%) Volume: 63mL Perfusion (Tmax>6.0s) volume: 77mL IMPRESSION: 1. No emergent large vessel occlusion or infarct/ischemia by CT perfusion. 2. Very mild atherosclerosis in the neck. 3. Focal high-grade right P2 segment stenosis that is stable from prior MRA 4. Incidental prominent tracheobronchomalacia. Electronically Signed   By: Monte Fantasia M.D.   On: 09/15/2018 09:52   Ct Head Wo Contrast  Result Date: 09/15/2018 CLINICAL DATA:  LEFT-sided weakness since last night, slurred speech this morning, altered level of consciousness, history CHF, stage III chronic kidney disease, COPD, essential hypertension, type II diabetes mellitus, prior pulmonary embolism EXAM: CT HEAD  WITHOUT CONTRAST TECHNIQUE: Contiguous axial images were obtained from the base of the skull through the vertex without intravenous contrast. Sagittal and coronal MPR images reconstructed from axial data set. COMPARISON:  08/22/2018 FINDINGS: Brain: Generalized atrophy. Normal ventricular morphology. No midline shift or mass effect. Small vessel chronic ischemic changes of deep cerebral white matter. Old lacunar infarct versus prominent perivascular space at LEFT basal ganglia. No intracranial hemorrhage, mass lesion, evidence of acute infarction, or extra-axial fluid collection. Vascular: Minimal atherosclerotic calcifications of internal carotid arteries at skull base. Skull: Intact.  Scattered dural calcifications bilaterally. Sinuses/Orbits: Mucosal thickening throughout ethmoid air cells and maxillary sinuses. Small mucosal retention cysts within maxillary sinuses. Other: N/A IMPRESSION: Atrophy with small vessel chronic ischemic changes of deep cerebral white matter. Old LEFT basal ganglia lacunar infarct versus prominent perivascular space unchanged. No acute intracranial abnormalities. Electronically Signed   By: Lavonia Dana M.D.   On: 09/15/2018 08:29   Ct Angio Neck W And/or Wo Contrast  Result Date: 09/15/2018 CLINICAL DATA:  Altered level of consciousness.  Stroke. EXAM: CT ANGIOGRAPHY HEAD AND NECK CT PERFUSION BRAIN TECHNIQUE: Multidetector CT imaging of the head and neck was performed using the standard protocol during bolus administration of intravenous contrast. Multiplanar CT image reconstructions and MIPs were obtained to evaluate the vascular anatomy. Carotid stenosis measurements (when applicable) are obtained utilizing NASCET criteria, using the distal internal carotid diameter as the denominator. Multiphase CT imaging of the brain was performed following IV bolus contrast injection. Subsequent parametric perfusion maps were calculated using RAPID software. CONTRAST:  168mL ISOVUE-370  IOPAMIDOL (ISOVUE-370) INJECTION 76% COMPARISON:  Noncontrast head CT from earlier today.  FINDINGS: CTA NECK FINDINGS Aortic arch: Atherosclerotic calcification.  Three vessel branching. Right carotid system: Vessels are smooth and widely patent. No noted atheromatous changes. Left carotid system: Vessels are smooth and widely patent. Mild calcified plaque at the ICA bulb. Vertebral arteries: Proximal subclavian arteries are widely patent. Symmetric vertebral arteries that are smooth and widely patent to the dura Skeleton: No acute finding Other neck: No significant incidental finding. Generalized mucosal thickening in the paranasal sinuses. Upper chest: Tracheobronchomalacia with prominent airway narrowing. Bilateral cataract resection. Prominent mediastinal lymph nodes but not pathologic by short axis measurement. Review of the MIP images confirms the above findings CTA HEAD FINDINGS Anterior circulation: Atherosclerotic calcification of the carotid siphons. No branch occlusion, aneurysm, or flow limiting stenosis. Posterior circulation: Symmetric vertebral arteries. The vertebrobasilar arteries are smooth and widely patent. Fetal type right PCA. High-grade narrowing of the distal right P2 segment, stable from 07/27/2018 MRA Venous sinuses: Patent Anatomic variants: As above Delayed phase: Not obtained in the emergent setting Review of the MIP images confirms the above findings CT Brain Perfusion Findings: CBF (<30%) Volume: 78mL Perfusion (Tmax>6.0s) volume: 36mL IMPRESSION: 1. No emergent large vessel occlusion or infarct/ischemia by CT perfusion. 2. Very mild atherosclerosis in the neck. 3. Focal high-grade right P2 segment stenosis that is stable from prior MRA 4. Incidental prominent tracheobronchomalacia. Electronically Signed   By: Monte Fantasia M.D.   On: 09/15/2018 09:52   Ct Cerebral Perfusion W Contrast  Result Date: 09/15/2018 CLINICAL DATA:  Altered level of consciousness.  Stroke. EXAM: CT  ANGIOGRAPHY HEAD AND NECK CT PERFUSION BRAIN TECHNIQUE: Multidetector CT imaging of the head and neck was performed using the standard protocol during bolus administration of intravenous contrast. Multiplanar CT image reconstructions and MIPs were obtained to evaluate the vascular anatomy. Carotid stenosis measurements (when applicable) are obtained utilizing NASCET criteria, using the distal internal carotid diameter as the denominator. Multiphase CT imaging of the brain was performed following IV bolus contrast injection. Subsequent parametric perfusion maps were calculated using RAPID software. CONTRAST:  155mL ISOVUE-370 IOPAMIDOL (ISOVUE-370) INJECTION 76% COMPARISON:  Noncontrast head CT from earlier today. FINDINGS: CTA NECK FINDINGS Aortic arch: Atherosclerotic calcification.  Three vessel branching. Right carotid system: Vessels are smooth and widely patent. No noted atheromatous changes. Left carotid system: Vessels are smooth and widely patent. Mild calcified plaque at the ICA bulb. Vertebral arteries: Proximal subclavian arteries are widely patent. Symmetric vertebral arteries that are smooth and widely patent to the dura Skeleton: No acute finding Other neck: No significant incidental finding. Generalized mucosal thickening in the paranasal sinuses. Upper chest: Tracheobronchomalacia with prominent airway narrowing. Bilateral cataract resection. Prominent mediastinal lymph nodes but not pathologic by short axis measurement. Review of the MIP images confirms the above findings CTA HEAD FINDINGS Anterior circulation: Atherosclerotic calcification of the carotid siphons. No branch occlusion, aneurysm, or flow limiting stenosis. Posterior circulation: Symmetric vertebral arteries. The vertebrobasilar arteries are smooth and widely patent. Fetal type right PCA. High-grade narrowing of the distal right P2 segment, stable from 07/27/2018 MRA Venous sinuses: Patent Anatomic variants: As above Delayed phase: Not  obtained in the emergent setting Review of the MIP images confirms the above findings CT Brain Perfusion Findings: CBF (<30%) Volume: 53mL Perfusion (Tmax>6.0s) volume: 44mL IMPRESSION: 1. No emergent large vessel occlusion or infarct/ischemia by CT perfusion. 2. Very mild atherosclerosis in the neck. 3. Focal high-grade right P2 segment stenosis that is stable from prior MRA 4. Incidental prominent tracheobronchomalacia. Electronically Signed   By: Monte Fantasia  M.D.   On: 09/15/2018 09:52   Dg Chest Portable 1 View  Result Date: 09/15/2018 CLINICAL DATA:  68 y/o  F; wheezing and cough. EXAM: PORTABLE CHEST 1 VIEW COMPARISON:  08/16/2018 chest radiograph FINDINGS: Stable cardiomegaly given projection and technique. Pulmonary vascular congestion. No focal consolidation. No pleural effusion or pneumothorax. No acute osseous abnormality is evident. IMPRESSION: Cardiomegaly and pulmonary vascular congestion. No focal consolidation. Electronically Signed   By: Kristine Garbe M.D.   On: 09/15/2018 04:38    EKG: Independently reviewed.  Sinus rhythm at 86 bpm with LAFB and mild LVH.  Assessment/Plan Principal Problem:   Acute encephalopathy Active Problems:   Normocytic anemia   Schizophrenia (HCC)   Essential hypertension   Chronic diastolic heart failure (HCC)   Cerebral infarction (HCC)   CKD (chronic kidney disease), stage IV (HCC)   Type 2 diabetes mellitus (HCC)   Hypothyroidism    1. Acute encephalopathy in the setting of brief psychotic disorder with hallucinations and suicidal ideation.  Will evaluate further with EEG as well as brain MRI as recommended by tele-neurology and monitor carefully in stepdown unit with neurochecks.  We will also check carotid ultrasound as well as 2D echocardiogram with previous echo on 12/2017 with preserved EF and grade 1 diastolic dysfunction.  Check hemoglobin A1c, lipid panel, and TSH as well.  Will need sitter at bedside due to suicidal ideation.   Continue with Haldol as needed and Risperdal scheduled per psychiatry for schizoaffective disorder.  Consult to neurology for further evaluation appreciated. 2. CKD stage IV.  Patient appears to be at baseline with usual creatinine between 2.1-2.3.  Continue to monitor carefully.  Will place on gentle IV fluid for now as she is n.p.o. and has failed swallow evaluation. 3. Chronic anemia.  Appears to be at baseline with no overt bleeding identified.  Will recheck CBC in a.m. 4. Hypothyroidism.  Maintain on Synthroid and check TSH. 5. Chronic grade 1 diastolic heart failure.  Does not appear to be in any heart failure at this time and will check 2D echocardiogram as noted above.  Maintain on daily weights.  Withhold any diuretics at this time until patient is able to tolerate oral intake. 6. Type 2 diabetes.  Noted with some mild hyperglycemia at this time.  Will check hemoglobin A1c and maintain on SSI coverage. 7. Prior CVA.  Maintain on statin and full dose aspirin. 8. GERD. PPI IV daily.   DVT prophylaxis: Heparin Code Status: Full Family Communication: None at bedside Disposition Plan: Evaluation for status epilepticus and/or CVA Consults called: Neurology Admission status: Observation, stepdown unit   Findlay Dagher Darleen Crocker DO [facility] Pager [phone]  If 7PM-7AM, please contact night-coverage [email or web address] Password [email or web address]  09/15/2018, 11:06 AM

## 2018-09-15 NOTE — Progress Notes (Signed)
Patient expressed concern for spiritual and prayer. She complain of seeing demons that turture.her and encouraging her to take her own life. Listened supportive to patient and offered prayer according to her request.

## 2018-09-15 NOTE — Progress Notes (Signed)
Around 1700 pt sitter called for RN pt stated she wasn't feeling good and then had another unresponsive episode to where she didn't respond to pain or anything but still had stable vital signs. Pt then started responding 2 minutes later moving feet and looking at staff. She believes demons are after her and keeps saying they are going to kill her. This RN reassured her she is safe and sitter is constantly reminding her of the situation and that it is safe.

## 2018-09-15 NOTE — Consult Note (Addendum)
[place] A. Merlene Laughter, MD     [email or web address]          [name] is an 68 y.o. female.   ASSESSMENT/PLAN: 1.  Acute encephalopathy: This is most likely multifactorial including the effects of acute urinary tract infection -Enterococcus faecalis.  There is undoubtedly however significant psychopathology given the patient's longstanding history.  I will check with the patient's for an RPR and Troponin 1.  Otherwise no additional recommendations is suggested at this time.    Patient is 68 year old white female who presents with episodes of hallucination and confusion.  The patient has been admitted multiple times for various neurological problems including flaccid extremities, altered mental status and left-sided weakness.  She has been worked up numerous times with extensive imaging and the EEG.  They all have been unremarkable.  The ongoing consensus with the patient's neurological symptoms that they are due to psychosomatic disorders including catatonia.  Patient has responded well previously.  She again presents with episode of confusion and hallucination.  Work-up has been significant for several metabolic derangements but most of them have been chronic including anemia, hyperglycemia and chronic renal impairment.  She was noted to have significant urinalysis however and the culture grew out Enterococcus faecalis.  The patient is amnestic as to why she is in the hospital.  She reports that she continues to have hallucinations which are worrisome to the patient.  They occur both when she is awake but also when she closes her eyes to go to sleep.  She complains of having significant discomfort of the thoracic region and legs especially since she started having these events.  She tells me however that she has had a long history of leg pain.  She has a habitus of someone who has significant sleep apnea as reported in the past medical history.  She tells me she is compliant  with CPAP use.  The review of systems otherwise negative.    GENERAL: This is a morbidly obese female who appears anxious but in no acute distress.  HEENT: She has a large stocky neck.  Airway is relatively open however although some moderate.  ABDOMEN: soft  EXTREMITIES: No edema; the anterior part of the legs are slightly erythematous.  Appears to be superficial sore involving the right foot.  There is severe arthritic changes of the knees bilaterally especially on the left  BACK: Normal  SKIN: Normal by inspection.    MENTAL STATUS: Alert and oriented. Speech, language and cognition are generally intact. .   CRANIAL NERVES: Pupils are equal, round and reactive to light and accomodation; extra ocular movements are full, there is no significant nystagmus; visual fields are full; upper and lower facial muscles are normal in strength and symmetric, there is no flattening of the nasolabial folds; tongue is midline; uvula is midline; shoulder elevation is normal.  MOTOR: Normal tone, bulk and strength; no pronator drift.  COORDINATION: Left finger to nose is normal, right finger to nose is normal, No rest tremor; no intention tremor; no postural tremor; no bradykinesia.  REFLEXES: Deep tendon reflexes are symmetrical and normal but diminished at the left knee. Plantar reflexes are flexor on the left but chronically upgoing on the L.  SENSATION: Normal to light touch, temperature, and pain.     Blood pressure (!) 162/73, pulse 77, temperature 97.6 F (36.4 C), temperature source Oral, resp. rate (!) 22, height 5\' 3"  (1.6 m), weight 99.2 kg, SpO2 96 %.  Past Medical History:  Diagnosis  Date  . Anxiety   . Asthma   . Back pain   . Barrett esophagus   . CHF (congestive heart failure) ([place])   . CKD (chronic kidney disease) stage 3, GFR 30-59 ml/min (HCC)   . COPD (chronic obstructive pulmonary disease) ([place])   . Depression   . Diastolic heart failure ([place])   . Essential  hypertension   . Fibromyalgia   . GERD (gastroesophageal reflux disease)   . Gout   . History of pericarditis   . Hyperlipidemia   . Hypothyroid   . Major depressive disorder   . Memory loss   . Nephrolithiasis    2008  . Obesity hypoventilation syndrome (HCC)    CPAP  . Obstructive sleep apnea   . PE (pulmonary embolism)    2010  . Peripheral neuropathy   . PTSD (post-traumatic stress disorder)   . Rheumatoid arthritis ([place])   . Schizophrenia ([place])   . Steroid dependence   . Type 2 diabetes mellitus ([place])   . Vitamin D deficiency     Past Surgical History:  Procedure Laterality Date  . APPENDECTOMY    . CHOLECYSTECTOMY    . COLONOSCOPY     benign polyps (12/2000), repeat colonoscopy performed in 2007  . ENDOMETRIAL BIOPSY     10/03 benign columnar mucosa (limited material)  . KIDNEY SURGERY      Family History  Problem Relation Age of Onset  . Bone cancer Mother   . Hypertension Mother   . Heart disease Mother   . COPD Father   . Heart disease Father   . Stroke Father     Social History:  reports that she quit smoking about 46 years ago. Her smoking use included cigarettes. She has never used smokeless tobacco. She reports that she does not drink alcohol or use drugs.  Allergies:  Allergies  Allergen Reactions  . Benztropine Mesylate Other (See Comments)    Alopecia and white spots on eyes  . Codeine Swelling, Palpitations and Other (See Comments)    Mouth Swelling and Tachycardia   . Diazepam Other (See Comments)    COMA  . Diphenhydramine Hcl Anxiety, Palpitations and Other (See Comments)    Tachycardia and anxiousness  . Penicillins Other (See Comments)    REACTION: ulcers in mouth, swelling, throat swelling  Has patient had a PCN reaction causing immediate rash, facial/tongue/throat swelling, SOB or lightheadedness with hypotension: YES Has patient had a PCN reaction causing severe rash involving Mucus membranes or skin necrosis: Unknown Has patient  had a PCN reaction that required hospitalization Unknown Has patient had a PCN reaction occurring within the last 10 years: unknown If all of the above answers are "NO", then may proceed with Cephalospori  . Sulfonamide Derivatives Rash    Blisters, also  . Thioridazine Hcl Swelling  . Benztropine Other (See Comments)    "Alopecia and white spots on eyes"  . Penicillin G Swelling and Other (See Comments)    Ulcers in mouth & tongue became swollen Has patient had a PCN reaction causing immediate rash, facial/tongue/throat swelling, SOB or lightheadedness with hypotension: Yes Has patient had a PCN reaction causing severe rash involving mucus membranes or skin necrosis: Unk Has patient had a PCN reaction that required hospitalization: Unk Has patient had a PCN reaction occurring within the last 10 years: Unk If all of the above answers are "NO", then may proceed with Cephalosporin use.   [name] Kitchen Lisinopril Cough  . Sulfamethoxazole Rash  Medications: Prior to Admission medications   Medication Sig Start Date End Date Taking? Authorizing Provider  acetaminophen (TYLENOL) 325 MG tablet Take 325 mg by mouth 2 (two) times daily.    Yes [provider]  albuterol (PROVENTIL) (2.5 MG/3ML) 0.083% nebulizer solution Take 2.5 mg by nebulization every 6 (six) hours as needed for wheezing.    Yes [provider]  albuterol (VENTOLIN HFA) 108 (90 Base) MCG/ACT inhaler Inhale 1-2 puffs into the lungs every 6 (six) hours as needed for wheezing or shortness of breath.   Yes [provider]  amLODipine (NORVASC) 5 MG tablet Take 1 tablet (5 mg total) by mouth daily. 07/31/18 07/31/19 Yes Ghimire, Henreitta Leber, MD  aspirin 81 MG tablet Take 81 mg by mouth daily.   Yes [provider]  atorvastatin (LIPITOR) 40 MG tablet Take 40 mg by mouth at bedtime.   Yes [provider]  carvedilol (COREG) 6.25 MG tablet Take 1 tablet (6.25 mg total) by mouth 2 (two) times daily  with a meal. 08/18/18  Yes Lavina Hamman, MD  Cholecalciferol (VITAMIN D3) 50 MCG (2000 UT) TABS Take 4,000 Units by mouth daily.   Yes [provider]  docusate sodium (COLACE) 100 MG capsule Take 100 mg by mouth daily.    Yes [provider]  Dulaglutide (TRULICITY) 1.5 JI/9.6VE SOPN Inject 1.5 mg into the skin once a week.   Yes [provider]  EPINEPHrine (EPI-PEN) 0.3 mg/0.3 mL DEVI Inject 0.3 mg into the muscle once as needed (for bee stings, then go to the ED immediately after using).    Yes [provider]  gabapentin (NEURONTIN) 300 MG capsule Take 300 mg by mouth 2 (two) times daily.  08/28/18  Yes [provider]  glipiZIDE (GLUCOTROL XL) 5 MG 24 hr tablet Take 5 mg by mouth 2 (two) times daily.    Yes [provider]  haloperidol (HALDOL) 0.5 MG tablet Take 1 tablet (0.5 mg total) by mouth 2 (two) times daily. 07/31/18  Yes Ghimire, Henreitta Leber, MD  hydrocortisone 2.5 % ointment Apply 1 application topically daily as needed (itchy skin).   Yes [provider]  insulin degludec (TRESIBA FLEXTOUCH) 100 UNIT/ML SOPN FlexTouch Pen Inject 20 Units into the skin at bedtime.   Yes [provider]  levETIRAcetam (KEPPRA) 500 MG tablet Take 1 tablet (500 mg total) by mouth 2 (two) times daily. 08/18/18  Yes Lavina Hamman, MD  levothyroxine (SYNTHROID, LEVOTHROID) 25 MCG tablet Take 1 tablet (25 mcg total) by mouth daily at 6 (six) AM. 08/19/18  Yes Lavina Hamman, MD  linagliptin (TRADJENTA) 5 MG TABS tablet Take 5 mg by mouth daily.   Yes [provider]  loperamide (IMODIUM) 2 MG capsule Take 2 mg by mouth 3 (three) times daily as needed (for diarrhea).    Yes [provider]  montelukast (SINGULAIR) 10 MG tablet Take 10 mg by mouth at bedtime.   Yes [provider]  Multiple Vitamin (DAILY VITE PO) Take 1 tablet by mouth daily.   Yes [provider]  nystatin (MYCOSTATIN) 100000 UNIT/ML  suspension Take 5 mLs by mouth 3 (three) times daily. 10 day course starting on 09/06/2018   Yes [provider]  omeprazole (PRILOSEC) 20 MG capsule Take 20 mg by mouth daily.    Yes [provider]  OVER THE COUNTER MEDICATION CPAP   Yes [provider]  phenazopyridine (PYRIDIUM) 100 MG tablet Take 200 mg  by mouth 3 (three) times daily as needed for pain.   Yes [provider]  polyethylene glycol powder (GLYCOLAX/MIRALAX) powder Take 17 g by mouth daily as needed (for constipation).   Yes [provider]  pyridOXINE (VITAMIN B-6) 100 MG tablet Take 200 mg by mouth 2 (two) times daily.   Yes [provider]  spironolactone (ALDACTONE) 25 MG tablet Take 1 tablet (25 mg total) by mouth daily. 04/12/18  Yes Mesner, Corene Cornea, MD  torsemide (DEMADEX) 20 MG tablet Take 60 mg alternating with 40 mg daily. Patient taking differently: Take 40-60 mg by mouth See admin instructions. 60 mg by mouth every other day: alternating with 40 mg by mouth every other day 07/26/18  Yes Strader, [place] M, PA-C  Glycerin, Laxative, (FLEET LIQUID GLYCERIN SUPP RE) Place 1 suppository rectally daily as needed (for constipation).    [provider]    Scheduled Meds: . aspirin  300 mg Rectal Daily   Or  . aspirin  325 mg Oral Daily  . atorvastatin  40 mg Oral QHS  . chlorhexidine  15 mL Mouth Rinse BID  . heparin  5,000 Units Subcutaneous Q8H  . insulin aspart  0-5 Units Subcutaneous QHS  . insulin aspart  0-9 Units Subcutaneous Q6H  . levothyroxine  12.5 mcg Intravenous Daily  . mouth rinse  15 mL Mouth Rinse q12n4p  . risperiDONE  0.5 mg Oral QHS   Continuous Infusions: . sodium chloride 75 mL/hr at 09/15/18 1356   PRN Meds:.acetaminophen **OR** acetaminophen (TYLENOL) oral liquid 160 mg/5 mL **OR** acetaminophen, albuterol, hydrALAZINE     Results for orders placed or performed during the hospital encounter of 09/12/18 (from the past 48  hour(s))  CBG monitoring, ED     Status: Abnormal   Collection Time: 09/14/18  6:09 AM  Result Value Ref Range   Glucose-Capillary 153 (H) 70 - 99 mg/dL   Comment 1 Notify RN    Comment 2 Document in Chart   CBG monitoring, ED     Status: Abnormal   Collection Time: 09/14/18 11:14 AM  Result Value Ref Range   Glucose-Capillary 261 (H) 70 - 99 mg/dL  CBG monitoring, ED     Status: Abnormal   Collection Time: 09/14/18  5:05 PM  Result Value Ref Range   Glucose-Capillary 366 (H) 70 - 99 mg/dL  CBG monitoring, ED     Status: Abnormal   Collection Time: 09/15/18  3:56 AM  Result Value Ref Range   Glucose-Capillary 181 (H) 70 - 99 mg/dL   Comment 1 Notify RN    Comment 2 Document in Chart   I-stat troponin, ED     Status: None   Collection Time: 09/15/18  5:48 AM  Result Value Ref Range   Troponin i, poc 0.00 0.00 - 0.08 ng/mL   Comment 3            Comment: Due to the release kinetics of cTnI, a negative result within the first hours of the onset of symptoms does not rule out myocardial infarction with certainty. If myocardial infarction is still suspected, repeat the test at appropriate intervals.   CBG monitoring, ED     Status: Abnormal   Collection Time: 09/15/18  8:37 AM  Result Value Ref Range   Glucose-Capillary 147 (H) 70 - 99 mg/dL  Protime-INR     Status: None   Collection Time: 09/15/18  8:54 AM  Result Value Ref Range   Prothrombin Time 13.7 11.4 -  15.2 seconds   INR 1.06     Comment: Performed at [facility], [address] [place], [place] [address]  APTT     Status: None   Collection Time: 09/15/18  8:54 AM  Result Value Ref Range   aPTT 30 24 - 36 seconds    Comment: Performed at [facility], [address], [place], [place] [address]  CBC     Status: Abnormal   Collection Time: 09/15/18  8:54 AM  Result Value Ref Range   WBC 6.2 4.0 - 10.5 K/uL   RBC 2.59 (L) 3.87 - 5.11 MIL/uL   Hemoglobin 8.2 (L) 12.0 - 15.0 g/dL   HCT 25.1 (L) 36.0 - 46.0 %   MCV  96.9 80.0 - 100.0 fL   MCH 31.7 26.0 - 34.0 pg   MCHC 32.7 30.0 - 36.0 g/dL   RDW 13.7 11.5 - 15.5 %   Platelets 152 150 - 400 K/uL   nRBC 0.0 0.0 - 0.2 %    Comment: Performed at [facility], [address], [place], [place] [address]  Differential     Status: None   Collection Time: 09/15/18  8:54 AM  Result Value Ref Range   Neutrophils Relative % 65 %   Neutro Abs 4.1 1.7 - 7.7 K/uL   Lymphocytes Relative 19 %   Lymphs Abs 1.2 0.7 - 4.0 K/uL   Monocytes Relative 9 %   Monocytes Absolute 0.6 0.1 - 1.0 K/uL   Eosinophils Relative 5 %   Eosinophils Absolute 0.3 0.0 - 0.5 K/uL   Basophils Relative 1 %   Basophils Absolute 0.0 0.0 - 0.1 K/uL   Immature Granulocytes 1 %   Abs Immature Granulocytes 0.04 0.00 - 0.07 K/uL    Comment: Performed at [facility], [address], [place], [place] [address]  Comprehensive metabolic panel     Status: Abnormal   Collection Time: 09/15/18  8:54 AM  Result Value Ref Range   Sodium 140 135 - 145 mmol/L   Potassium 3.7 3.5 - 5.1 mmol/L   Chloride 110 98 - 111 mmol/L   CO2 23 22 - 32 mmol/L   Glucose, Bld 163 (H) 70 - 99 mg/dL   BUN 36 (H) 8 - 23 mg/dL   Creatinine, Ser 2.28 (H) 0.44 - 1.00 mg/dL   Calcium 8.6 (L) 8.9 - 10.3 mg/dL   Total Protein 6.3 (L) 6.5 - 8.1 g/dL   Albumin 2.8 (L) 3.5 - 5.0 g/dL   AST 20 15 - 41 U/L   ALT 23 0 - 44 U/L   Alkaline Phosphatase 74 38 - 126 U/L   Total Bilirubin 0.3 0.3 - 1.2 mg/dL   GFR calc non Af Amer 22 (L) >60 mL/min   GFR calc Af Amer 25 (L) >60 mL/min   Anion gap 7 5 - 15    Comment: Performed at [facility], [address] [place], [place] [address]  I-stat troponin, ED     Status: None   Collection Time: 09/15/18  8:59 AM  Result Value Ref Range   Troponin i, poc 0.00 0.00 - 0.08 ng/mL   Comment 3            Comment: Due to the release kinetics of cTnI, a negative result within the first hours of the onset of symptoms does not rule out myocardial infarction with certainty. If myocardial  infarction is still suspected, repeat the test at appropriate intervals.   TSH  Status: None   Collection Time: 09/15/18 12:04 PM  Result Value Ref Range   TSH 3.260 0.350 - 4.500 uIU/mL    Comment: Performed by a 3rd Generation assay with a functional sensitivity of <=0.01 uIU/mL. Performed at [facility], [address], [place], [place] [address]   Ammonia     Status: None   Collection Time: 09/15/18 12:04 PM  Result Value Ref Range   Ammonia 20 9 - 35 umol/L    Comment: Performed at [facility], [address], [place], [place] [address]  Glucose, capillary     Status: Abnormal   Collection Time: 09/15/18  2:02 PM  Result Value Ref Range   Glucose-Capillary 129 (H) 70 - 99 mg/dL  Glucose, capillary     Status: Abnormal   Collection Time: 09/15/18  4:19 PM  Result Value Ref Range   Glucose-Capillary 142 (H) 70 - 99 mg/dL    Studies/Results:  HEAD NECK CTA FINDINGS: CTA NECK FINDINGS  Aortic arch: Atherosclerotic calcification.  Three vessel branching.  Right carotid system: Vessels are smooth and widely patent. No noted atheromatous changes.  Left carotid system: Vessels are smooth and widely patent. Mild calcified plaque at the ICA bulb.  Vertebral arteries: Proximal subclavian arteries are widely patent. Symmetric vertebral arteries that are smooth and widely patent to the dura  Skeleton: No acute finding  Other neck: No significant incidental finding. Generalized mucosal thickening in the paranasal sinuses.  Upper chest: Tracheobronchomalacia with prominent airway narrowing. Bilateral cataract resection. Prominent mediastinal lymph nodes but not pathologic by short axis measurement.  Review of the MIP images confirms the above findings  CTA HEAD FINDINGS  Anterior circulation: Atherosclerotic calcification of the carotid siphons. No branch occlusion, aneurysm, or flow limiting stenosis.  Posterior circulation: Symmetric vertebral  arteries. The vertebrobasilar arteries are smooth and widely patent. Fetal type right PCA. High-grade narrowing of the distal right P2 segment, stable from 07/27/2018 MRA  Venous sinuses: Patent  Anatomic variants: As above  Delayed phase: Not obtained in the emergent setting  Review of the MIP images confirms the above findings  CT Brain Perfusion Findings:  CBF (<30%) Volume: 73mL  Perfusion (Tmax>6.0s) volume: 10mL  IMPRESSION: 1. No emergent large vessel occlusion or infarct/ischemia by CT perfusion. 2. Very mild atherosclerosis in the neck. 3. Focal high-grade right P2 segment stenosis that is stable from prior MRA 4. Incidental prominent tracheobronchomalacia     BRAIN MRI FINDINGS: The patient was unable to remain motionless for the exam. Small or subtle lesions could be overlooked.  Brain: No evidence for acute infarction, hemorrhage, mass lesion, hydrocephalus, or extra-axial fluid. Cerebral and cerebellar atrophy, premature for age. Mild subcortical and periventricular T2 and FLAIR hyperintensities, likely chronic microvascular ischemic change.  Vascular: Flow voids are maintained.  Skull and upper cervical spine: Normal marrow signal.  Sinuses/Orbits: Chronic paranasal sinus disease. No layering fluid. Negative orbits.  Other: None.  IMPRESSION: Atrophy and small vessel disease.  No acute intracranial findings.   CAROTID NORMAL   The brain MRI is reviewed in person.  No acute findings are seen on DWI.  No hemorrhages appreciated on SWI.  There is mild global atrophy.  There is minimal white matter changes seen on FLAIR imaging.  No encephalomalacia is appreciated on T1.   Kaylan Friedmann A. Merlene Laughter, M.D.  Diplomate, Tax adviser of Psychiatry and Neurology ( Neurology). 09/15/2018, 6:23 PM

## 2018-09-15 NOTE — Clinical Social Work Note (Addendum)
Clinical Social Work Assessment  Patient Details  Name: [name] MRN: [id number] Date of Birth: 12/16/1950  Date of referral:  09/15/18               Reason for consult:  Discharge Planning                Permission sought to share information with:    Permission granted to share information::     Name::        Agency::     Relationship::     Contact Information:     Housing/Transportation Living arrangements for the past 2 months:  [place] of Information:  Facility Patient Interpreter Needed:  None Criminal Activity/Legal Involvement Pertinent to Current Situation/Hospitalization:  No - Comment as needed Significant Relationships:    Lives with:  Facility Resident Do you feel safe going back to the place where you live?  Yes Need for family participation in patient care:  No (Coment)  Care giving concerns:  Pt admitted from [facility]F. Pt has legal guardian with DSS.   Social Worker assessment / plan: Pt is a 68 year old female admitted from [facility] [place]. Pt had been in the ED here for a couple days waiting on a geripsych bed placement. Pt now admitted in the ICU with what appears to be a new stroke. Spoke with Baker Janus at [facility] today to update. Per Baker Janus, pt has a legal guardian with DSS. Pt is normally a min assist with dressing, bathing. At baseline, she was feeding herself and she was mostly in her wheelchair. She could ambulate with a walker and min assist at times. Pt was incontinent and wearing Depends at the ALF. She was able to verbalize her needs. It appears pt will likely need a higher level of care upon dc. LCSW will follow and assist with transition of care needs.  Employment status:  Retired Forensic scientist:  Medicare PT Recommendations:  Not assessed at this time Information / Referral to community resources:     Patient/Family's Response to care: Pt accepting of care.  Patient/Family's Understanding of and Emotional Response  to Diagnosis, Current Treatment, and Prognosis: Pt currently unable to understand diagnosis and treatment recommendations. CSW will communicate with pt's legal guardian. No emotional distress identified at this time.  Emotional Assessment Appearance:  Appears stated age Attitude/Demeanor/Rapport:  Unable to Assess Affect (typically observed):  Unable to Assess Orientation:  Oriented to Self Alcohol / Substance use:  Not Applicable Psych involvement (Current and /or in the community):  Yes (Comment)  Discharge Needs  Concerns to be addressed:  Discharge Planning Concerns Readmission within the last 30 days:  No Current discharge risk:  Physical Impairment, Psychiatric Illness Barriers to Discharge:  Continued Medical Work up   Avery Dennison, LCSW 09/15/2018, 12:56 PM

## 2018-09-15 NOTE — Progress Notes (Signed)
Inpatient Diabetes Program Recommendations  AACE/ADA: New Consensus Statement on Inpatient Glycemic Control (2015)  Target Ranges:  Prepandial:   less than 140 mg/dL      Peak postprandial:   less than 180 mg/dL (1-2 hours)      Critically ill patients:  140 - 180 mg/dL   Lab Results  Component Value Date   GLUCAP 147 (H) 09/15/2018   HGBA1C 6.8 (H) 01/12/2018    Review of Glycemic Control Results for [name], [name] (MRN [id number]) as of 09/15/2018 09:43  Ref. Range 09/14/2018 11:14 09/14/2018 17:05 09/15/2018 03:56 09/15/2018 08:37  Glucose-Capillary Latest Ref Range: 70 - 99 mg/dL 261 (H) 366 (H) 181 (H) 147 (H)   Diabetes history: Type 2 DM Outpatient Diabetes medications: Trulicity 1.5 mg Q/wk, Glipizide 5 mg BID, Tresiba 20 units QHS, Tradjenta 5 mg QD Current orders for Inpatient glycemic control: Lantus 20 units QHS, Trulicity 1.5 mg Q/wk  Inpatient Diabetes Program Recommendations:    Trulicity not on formulary. Consider adding Novolog 0-9 units TID and Novolog 0-5 units QHS correction.  Also, most recent A1C was from 12/2017. Consider adding on A1C.    Thanks, Bronson Curb, MSN, RNC-OB Diabetes Coordinator [phone] (8a-5p)

## 2018-09-15 NOTE — Clinical Social Work Note (Signed)
Netta Cedars at [facility] indicated that patient is not a ward of the state and she is her own LRP.     Berlynn Warsame, Clydene Pugh, LCSW

## 2018-09-15 NOTE — Progress Notes (Signed)
*  PRELIMINARY RESULTS* Echocardiogram 2D Echocardiogram has been performed.  Leavy Cella 09/15/2018, 3:11 PM

## 2018-09-15 NOTE — ED Notes (Addendum)
Pt will not speak with RN at this time. Pt will open her eyes and look at RN but will not verbally respond. Pt will not open her mouth to drink liquids or take medications. Pt will respond to sternal rub. During shift report this morning, this RN was notified by night shift RN that pt has schizoaffective disorder and sometimes doesn't want to respond to staff but then other times will. RN will continue to monitor.

## 2018-09-15 NOTE — ED Notes (Signed)
Sitter went in to give pt her meal tray. Pt was not responsive, eyes closed. Sitter attempted to tap pt on her cheek with no response, attempted sternal rub with no response. RN called to room. Pt's RN in with another pt while other RNs were assessing pt. Pt was found to be breathing normally, but unresponsive to stimuli, including sternal rub. Dr. Reather Converse was called to bedside and pt did respond minimally to sternal rub by Dr. Reather Converse. EDP reports pt will not follow commands. Dr. Reather Converse gave order to call Code Stroke with LKW time as 0730 today when he last spoke with pt.

## 2018-09-16 DIAGNOSIS — E1165 Type 2 diabetes mellitus with hyperglycemia: Secondary | ICD-10-CM | POA: Diagnosis not present

## 2018-09-16 DIAGNOSIS — I130 Hypertensive heart and chronic kidney disease with heart failure and stage 1 through stage 4 chronic kidney disease, or unspecified chronic kidney disease: Secondary | ICD-10-CM | POA: Diagnosis not present

## 2018-09-16 DIAGNOSIS — R45851 Suicidal ideations: Secondary | ICD-10-CM | POA: Diagnosis not present

## 2018-09-16 DIAGNOSIS — Z79899 Other long term (current) drug therapy: Secondary | ICD-10-CM | POA: Diagnosis not present

## 2018-09-16 DIAGNOSIS — G9340 Encephalopathy, unspecified: Secondary | ICD-10-CM | POA: Diagnosis not present

## 2018-09-16 DIAGNOSIS — Z7982 Long term (current) use of aspirin: Secondary | ICD-10-CM | POA: Diagnosis not present

## 2018-09-16 DIAGNOSIS — E039 Hypothyroidism, unspecified: Secondary | ICD-10-CM | POA: Diagnosis not present

## 2018-09-16 DIAGNOSIS — Z87891 Personal history of nicotine dependence: Secondary | ICD-10-CM | POA: Diagnosis not present

## 2018-09-16 DIAGNOSIS — Z79890 Hormone replacement therapy (postmenopausal): Secondary | ICD-10-CM | POA: Diagnosis not present

## 2018-09-16 DIAGNOSIS — Z8673 Personal history of transient ischemic attack (TIA), and cerebral infarction without residual deficits: Secondary | ICD-10-CM | POA: Diagnosis not present

## 2018-09-16 DIAGNOSIS — I5032 Chronic diastolic (congestive) heart failure: Secondary | ICD-10-CM | POA: Diagnosis not present

## 2018-09-16 DIAGNOSIS — G9341 Metabolic encephalopathy: Secondary | ICD-10-CM | POA: Diagnosis not present

## 2018-09-16 DIAGNOSIS — N390 Urinary tract infection, site not specified: Secondary | ICD-10-CM | POA: Diagnosis not present

## 2018-09-16 DIAGNOSIS — E1122 Type 2 diabetes mellitus with diabetic chronic kidney disease: Secondary | ICD-10-CM | POA: Diagnosis not present

## 2018-09-16 DIAGNOSIS — E1142 Type 2 diabetes mellitus with diabetic polyneuropathy: Secondary | ICD-10-CM | POA: Diagnosis not present

## 2018-09-16 DIAGNOSIS — Z7984 Long term (current) use of oral hypoglycemic drugs: Secondary | ICD-10-CM | POA: Diagnosis not present

## 2018-09-16 DIAGNOSIS — T1491XA Suicide attempt, initial encounter: Secondary | ICD-10-CM | POA: Diagnosis present

## 2018-09-16 DIAGNOSIS — K219 Gastro-esophageal reflux disease without esophagitis: Secondary | ICD-10-CM | POA: Diagnosis not present

## 2018-09-16 DIAGNOSIS — J449 Chronic obstructive pulmonary disease, unspecified: Secondary | ICD-10-CM | POA: Diagnosis not present

## 2018-09-16 DIAGNOSIS — N184 Chronic kidney disease, stage 4 (severe): Secondary | ICD-10-CM | POA: Diagnosis not present

## 2018-09-16 DIAGNOSIS — F259 Schizoaffective disorder, unspecified: Secondary | ICD-10-CM | POA: Diagnosis not present

## 2018-09-16 DIAGNOSIS — B952 Enterococcus as the cause of diseases classified elsewhere: Secondary | ICD-10-CM | POA: Diagnosis not present

## 2018-09-16 DIAGNOSIS — D649 Anemia, unspecified: Secondary | ICD-10-CM | POA: Diagnosis not present

## 2018-09-16 DIAGNOSIS — R4701 Aphasia: Secondary | ICD-10-CM | POA: Diagnosis not present

## 2018-09-16 DIAGNOSIS — Z7952 Long term (current) use of systemic steroids: Secondary | ICD-10-CM | POA: Diagnosis not present

## 2018-09-16 DIAGNOSIS — E785 Hyperlipidemia, unspecified: Secondary | ICD-10-CM | POA: Diagnosis not present

## 2018-09-16 LAB — GLUCOSE, CAPILLARY
Glucose-Capillary: 111 mg/dL — ABNORMAL HIGH (ref 70–99)
Glucose-Capillary: 121 mg/dL — ABNORMAL HIGH (ref 70–99)
Glucose-Capillary: 128 mg/dL — ABNORMAL HIGH (ref 70–99)
Glucose-Capillary: 196 mg/dL — ABNORMAL HIGH (ref 70–99)
Glucose-Capillary: 233 mg/dL — ABNORMAL HIGH (ref 70–99)

## 2018-09-16 LAB — LIPID PANEL
Cholesterol: 160 mg/dL (ref 0–200)
HDL: 31 mg/dL — ABNORMAL LOW (ref >40)
LDL Cholesterol: 78 mg/dL (ref 0–99)
Total CHOL/HDL Ratio: 5.2 RATIO
Triglycerides: 255 mg/dL — ABNORMAL HIGH (ref <150)
VLDL: 51 mg/dL — ABNORMAL HIGH (ref 0–40)

## 2018-09-16 LAB — HOMOCYSTEINE: Homocysteine: 12 umol/L (ref 0.0–17.2)

## 2018-09-16 LAB — HEMOGLOBIN A1C
Hgb A1c MFr Bld: 7.4 % — ABNORMAL HIGH (ref 4.8–5.6)
Mean Plasma Glucose: 165.68 mg/dL

## 2018-09-16 LAB — TROPONIN I
Troponin I: <0.03 ng/mL (ref <0.03)
Troponin I: <0.03 ng/mL (ref <0.03)
Troponin I: <0.03 ng/mL (ref <0.03)

## 2018-09-16 LAB — MRSA PCR SCREENING: MRSA by PCR: NEGATIVE

## 2018-09-16 LAB — RPR: RPR Ser Ql: NONREACTIVE

## 2018-09-16 MED ORDER — TRAZODONE HCL 50 MG PO TABS
50.0000 mg | ORAL_TABLET | Freq: Once | ORAL | Status: AC
  Administered 2018-09-16: 50 mg via ORAL
  Filled 2018-09-16: qty 1

## 2018-09-16 MED ORDER — TRAMADOL HCL 50 MG PO TABS
50.0000 mg | ORAL_TABLET | Freq: Two times a day (BID) | ORAL | Status: DC | PRN
  Administered 2018-09-16 – 2018-09-18 (×2): 50 mg via ORAL
  Filled 2018-09-16 (×2): qty 1

## 2018-09-16 MED ORDER — FENTANYL CITRATE (PF) 100 MCG/2ML IJ SOLN: 50.0000 ug | INTRAMUSCULAR | Status: DC | PRN

## 2018-09-16 MED ORDER — LEVOFLOXACIN IN D5W 250 MG/50ML IV SOLN
250.0000 mg | INTRAVENOUS | Status: DC
  Filled 2018-09-16 (×2): qty 50

## 2018-09-16 MED ORDER — LEVOFLOXACIN IN D5W 500 MG/100ML IV SOLN
500.0000 mg | Freq: Once | INTRAVENOUS | Status: AC
  Administered 2018-09-16: 500 mg via INTRAVENOUS
  Filled 2018-09-16: qty 100

## 2018-09-16 MED ORDER — RISPERIDONE 0.5 MG PO TABS
0.5000 mg | ORAL_TABLET | Freq: Two times a day (BID) | ORAL | Status: DC
  Administered 2018-09-16 – 2018-09-19 (×6): 0.5 mg via ORAL
  Filled 2018-09-16 (×6): qty 1

## 2018-09-16 MED ORDER — NITROGLYCERIN 0.4 MG SL SUBL: 0.4000 mg | SUBLINGUAL_TABLET | SUBLINGUAL | Status: DC | PRN

## 2018-09-16 MED ORDER — GABAPENTIN 600 MG PO TABS
300.0000 mg | ORAL_TABLET | Freq: Once | ORAL | Status: DC
  Filled 2018-09-16: qty 0.5

## 2018-09-16 MED ORDER — INSULIN ASPART 100 UNIT/ML ~~LOC~~ SOLN
0.0000 [IU] | Freq: Three times a day (TID) | SUBCUTANEOUS | Status: DC
  Administered 2018-09-16: 3 [IU] via SUBCUTANEOUS
  Administered 2018-09-17: 2 [IU] via SUBCUTANEOUS
  Administered 2018-09-17 – 2018-09-18 (×3): 3 [IU] via SUBCUTANEOUS
  Administered 2018-09-18: 2 [IU] via SUBCUTANEOUS
  Administered 2018-09-18: 1 [IU] via SUBCUTANEOUS
  Administered 2018-09-19: 3 [IU] via SUBCUTANEOUS
  Administered 2018-09-19: 2 [IU] via SUBCUTANEOUS

## 2018-09-16 MED ORDER — GABAPENTIN 300 MG PO CAPS
300.0000 mg | ORAL_CAPSULE | Freq: Once | ORAL | Status: AC
  Administered 2018-09-16: 300 mg via ORAL
  Filled 2018-09-16: qty 1

## 2018-09-16 NOTE — Progress Notes (Signed)
Patient reports CP at this time. INTERVENTIONS: -patient assessed and the pain she is experiencing; she has been experiencing the pain for "a few days now". -patient endorses the difference between the pain is "it hurts" -see vitals -provider made aware via [facility]

## 2018-09-16 NOTE — Progress Notes (Signed)
Per Dr. Manuella Ghazi okay not to have PIV

## 2018-09-16 NOTE — Progress Notes (Signed)
ANTIBIOTIC CONSULT NOTE-Preliminary  Pharmacy Consult for levofloxacin Indication: UTI  Allergies  Allergen Reactions  . Benztropine Mesylate Other (See Comments)    Alopecia and white spots on eyes  . Codeine Swelling, Palpitations and Other (See Comments)    Mouth Swelling and Tachycardia   . Diazepam Other (See Comments)    COMA  . Diphenhydramine Hcl Anxiety, Palpitations and Other (See Comments)    Tachycardia and anxiousness  . Penicillins Other (See Comments)    REACTION: ulcers in mouth, swelling, throat swelling  Has patient had a PCN reaction causing immediate rash, facial/tongue/throat swelling, SOB or lightheadedness with hypotension: YES Has patient had a PCN reaction causing severe rash involving Mucus membranes or skin necrosis: Unknown Has patient had a PCN reaction that required hospitalization Unknown Has patient had a PCN reaction occurring within the last 10 years: unknown If all of the above answers are "NO", then may proceed with Cephalospori  . Sulfonamide Derivatives Rash    Blisters, also  . Thioridazine Hcl Swelling  . Benztropine Other (See Comments)    "Alopecia and white spots on eyes"  . Penicillin G Swelling and Other (See Comments)    Ulcers in mouth & tongue became swollen Has patient had a PCN reaction causing immediate rash, facial/tongue/throat swelling, SOB or lightheadedness with hypotension: Yes Has patient had a PCN reaction causing severe rash involving mucus membranes or skin necrosis: Unk Has patient had a PCN reaction that required hospitalization: Unk Has patient had a PCN reaction occurring within the last 10 years: Unk If all of the above answers are "NO", then may proceed with Cephalosporin use.   [name] Kitchen Lisinopril Cough  . Sulfamethoxazole Rash    Patient Measurements: Height: 5\' 3"  (160 cm) Weight: 212 lb 11.9 oz (96.5 kg) IBW/kg (Calculated) : 52.4 Adjusted Body Weight:   Vital Signs: Temp: 98.5 F (36.9 C) (02/01  0249) Temp Source: Oral (02/01 0249) BP: 159/75 (02/01 0600) Pulse Rate: 75 (02/01 0600)  Labs: Recent Labs    09/15/18 0854  WBC 6.2  HGB 8.2*  PLT 152  CREATININE 2.28*    Estimated Creatinine Clearance: 26.8 mL/min (A) (by C-G formula based on SCr of 2.28 mg/dL (H)).  No results for input(s): VANCOTROUGH, VANCOPEAK, VANCORANDOM, GENTTROUGH, GENTPEAK, GENTRANDOM, TOBRATROUGH, TOBRAPEAK, TOBRARND, AMIKACINPEAK, AMIKACINTROU, AMIKACIN in the last 72 hours.   Microbiology: Recent Results (from the past 720 hour(s))  Urine culture     Status: Abnormal   Collection Time: 09/13/18  3:25 AM  Result Value Ref Range Status   Specimen Description   Final    URINE, CLEAN CATCH Performed at [facility], [address], [place], [place] [address]    Special Requests   Final    NONE Performed at [facility], [address], [place], [place] [address]    Culture >=100,000 COLONIES/mL ENTEROCOCCUS FAECALIS (A)  Final   Report Status 09/15/2018 FINAL  Final   Organism ID, Bacteria ENTEROCOCCUS FAECALIS (A)  Final      Susceptibility   Enterococcus faecalis - MIC*    AMPICILLIN <=2 SENSITIVE Sensitive     LEVOFLOXACIN 1 SENSITIVE Sensitive     NITROFURANTOIN <=16 SENSITIVE Sensitive     VANCOMYCIN 1 SENSITIVE Sensitive     * >=100,000 COLONIES/mL ENTEROCOCCUS FAECALIS  MRSA PCR Screening     Status: None   Collection Time: 09/15/18 11:57 AM  Result Value Ref Range Status   MRSA by PCR NEGATIVE NEGATIVE Final    Comment:  The GeneXpert MRSA Assay (FDA approved for NASAL specimens only), is one component of a comprehensive MRSA colonization surveillance program. It is not intended to diagnose MRSA infection nor to guide or monitor treatment for MRSA infections. Performed at [facility], [address] [place], [place] [address]     Medical History: Past Medical History:  Diagnosis Date  . Anxiety   . Asthma   . Back pain   . Barrett esophagus   . CHF  (congestive heart failure) ([place])   . CKD (chronic kidney disease) stage 3, GFR 30-59 ml/min (HCC)   . COPD (chronic obstructive pulmonary disease) ([place])   . Depression   . Diastolic heart failure ([place])   . Essential hypertension   . Fibromyalgia   . GERD (gastroesophageal reflux disease)   . Gout   . History of pericarditis   . Hyperlipidemia   . Hypothyroid   . Major depressive disorder   . Memory loss   . Nephrolithiasis    2008  . Obesity hypoventilation syndrome (HCC)    CPAP  . Obstructive sleep apnea   . PE (pulmonary embolism)    2010  . Peripheral neuropathy   . PTSD (post-traumatic stress disorder)   . Rheumatoid arthritis ([place])   . Schizophrenia ([place])   . Steroid dependence   . Type 2 diabetes mellitus ([place])   . Vitamin D deficiency     Medications:  Scheduled:  . aspirin  300 mg Rectal Daily   Or  . aspirin  325 mg Oral Daily  . atorvastatin  40 mg Oral QHS  . chlorhexidine  15 mL Mouth Rinse BID  . heparin  5,000 Units Subcutaneous Q8H  . insulin aspart  0-5 Units Subcutaneous QHS  . insulin aspart  0-9 Units Subcutaneous Q6H  . levothyroxine  12.5 mcg Intravenous Daily  . mouth rinse  15 mL Mouth Rinse q12n4p  . risperiDONE  0.5 mg Oral QHS   Infusions:  . sodium chloride 75 mL/hr at 09/16/18 0619  . levofloxacin (LEVAQUIN) IV     Anti-infectives (From admission, onward)   Start     Dose/Rate Route Frequency Ordered Stop   09/16/18 0730  levofloxacin (LEVAQUIN) IVPB 500 mg     500 mg 100 mL/hr over 60 Minutes Intravenous  Once 09/16/18 0720     09/13/18 2000  ciprofloxacin (CIPRO) tablet 250 mg  Status:  Discontinued     250 mg Oral 2 times daily 09/13/18 1754 09/15/18 1133      Assessment: Admitted for hallucinations and suicide attempt [place] Cx positive for >100,000 colonies of Enterococcus faecalis.  Starting levofloxacin.   Plan:  Preliminary review of pertinent patient information completed.  Protocol will be initiated with dose(s) of  levofloxacin 500mg  x 1.  Forestine Na clinical pharmacist will complete review during morning rounds to assess patient and finalize treatment regimen if needed.  Nyra Capes, [facility] 09/16/2018,7:24 AM

## 2018-09-16 NOTE — Progress Notes (Signed)
LCSW received a call back from [facility] and he reported a referral was made on January 7th and closed on January 11th as the necessary documents were not received by [facility]  LCSW spoke to Dr Brigitte Pulse she has been cleared by psychiatric team  [facility]- [place] unit and does not meet in patient criteria for [place].  LCSW received a call from [facility] for [facility] and he requested this worker call the facility then call [facility]  LCSW called [facility]PS- weekend coverage worker from [facility] and is awaiting call back  [employer] LCSW [phone]

## 2018-09-16 NOTE — Progress Notes (Signed)
PROGRESS NOTE    [name]  VEH:[id number] DOB: 09-27-1950 DOA: 09/12/2018 PCP: Glenda Chroman, MD   Brief Narrative:  Per HPI: [name] is a 68 y.o. female with medical history significant for schizoaffective disorder, CKD stage IV, chronic anemia, hypertension, dyslipidemia, type 2 diabetes, hypothyroidism, obesity, prior CVA, and questionable prior seizure activity who was brought to the ED on 1/29 from her assisted living facility where she was found by the nurse with a plastic bag around her head.  It appears that she had this bag around her head and an attempt to harm herself and stated to the nurse at that time that she was seeing demons and monsters that did not leave her alone.  She continued to have hallucinations with potential suicidal ideation for which she was initially given some Haldol and was assessed by psychiatry.  She awaited bed placement to inpatient psychiatry in the ED and was started on Risperdal 0.5 mg twice a day for brief psychotic disorder.  She had remained stable in the ED until this morning at which point she became much less responsive and had some slurred speech for which she underwent stroke evaluation.  She underwent multiple imaging studies with CTA as well as perfusion study with no acute findings noted.  Tele-neurology was consulted and recommended further evaluation with EEG and brain MRI.  Patient has been admitted with acute encephalopathy-suspect metabolic with UTI secondary to enterococcus.  She has been started on IV Levaquin and has been revisited by TTS today and noted to be stable for discharge on twice daily Risperdal.  Assessment & Plan:   Principal Problem:   Acute encephalopathy Active Problems:   Normocytic anemia   Schizophrenia ([place])   Essential hypertension   Chronic diastolic heart failure (HCC)   Cerebral infarction ([place])   CKD (chronic kidney disease), stage IV (HCC)   Type 2 diabetes mellitus ([place])    Hypothyroidism  1. Acute metabolic encephalopathy in the setting of brief psychotic disorder with hallucinations and suicidal ideation-improved.  Will evaluate further with EEG as well as brain MRI as recommended by tele-neurology and monitor carefully in stepdown unit with neurochecks.  We will also check carotid ultrasound as well as 2D echocardiogram with previous echo on 12/2017 with preserved EF and grade 1 diastolic dysfunction.  Check hemoglobin A1c, lipid panel, and TSH as well.    No further need for sitter at bedside at this time per TTS.  She will be placed on Risperdal 1 mg at bedtime and 0.5 mg in the morning.  Appears to have UTI which is a significant contributing factor.  Stable for transfer to [place] today. 2. Enterococcal UTI.  Started on Levaquin IV for treatment. 3. CKD stage IV.  Patient appears to be at baseline with usual creatinine between 2.1-2.3.  Continue to monitor carefully.  Will place on gentle IV fluid for now as she is n.p.o. and has failed swallow evaluation. 4. Chronic anemia.  Appears to be at baseline with no overt bleeding identified.  Will recheck CBC in a.m. 5. Hypothyroidism.  Maintain on Synthroid and TSH checked and found to be within normal limits. 6. Chronic grade 1 diastolic heart failure.  Does not appear to be in any heart failure at this time and will check 2D echocardiogram as noted above.  Maintain on daily weights.  Withhold any diuretics at this time until patient is able to tolerate oral intake. 7. Type 2 diabetes.  Noted with some mild hyperglycemia at this  time.  Will check hemoglobin A1c which was 7.4% and maintain on SSI coverage. 8. Prior CVA.  Maintain on statin and full dose aspirin. 9. GERD. PPI IV daily.  DVT prophylaxis: Heparin Code Status: Full Family Communication: None at bedside Disposition Plan: Placement being worked on by social workers.  No CVA or findings noted on EEG.  Continue on Levaquin for UTI.   Consultants:    Neurology  TTS  Procedures:   EEG performed on 09/15/2018  Antimicrobials:   IV Levaquin 2/1->   Subjective: Patient seen and evaluated today with no new acute complaints or concerns. No acute concerns or events noted overnight.  She is more awake and alert and responsive this morning.  Objective: Vitals:   09/16/18 0900 09/16/18 1000 09/16/18 1100 09/16/18 1200  BP: (!) 159/77     Pulse: 86 81 84 92  Resp: (!) 24 17 20  (!) 22  Temp:      TempSrc:      SpO2: 100% 98% 100% 98%  Weight:      Height:        Intake/Output Summary (Last 24 hours) at 09/16/2018 1427 Last data filed at 09/16/2018 0930 Gross per 24 hour  Intake 999.44 ml  Output 750 ml  Net 249.44 ml   Filed Weights   09/14/18 0244 09/15/18 1154 09/16/18 0500  Weight: 96.5 kg 99.2 kg 96.5 kg    Examination:  General exam: Appears calm and comfortable  Respiratory system: Clear to auscultation. Respiratory effort normal.  Currently on room air. Cardiovascular system: S1 & S2 heard, RRR. No JVD, murmurs, rubs, gallops or clicks. No pedal edema. Gastrointestinal system: Abdomen is nondistended, soft and nontender. No organomegaly or masses felt. Normal bowel sounds heard. Central nervous system: Alert and oriented. No focal neurological deficits. Extremities: Symmetric 5 x 5 power. Skin: No rashes, lesions or ulcers Psychiatry: Judgement and insight appear normal. Mood & affect appropriate.     Data Reviewed: I have personally reviewed following labs and imaging studies  CBC: Recent Labs  Lab 09/13/18 0038 09/15/18 0854  WBC 6.4 6.2  NEUTROABS 4.5 4.1  HGB 8.6* 8.2*  HCT 25.9* 25.1*  MCV 95.9 96.9  PLT 160 798   Basic Metabolic Panel: Recent Labs  Lab 09/13/18 0038 09/15/18 0854  NA 137 140  K 3.7 3.7  CL 107 110  CO2 24 23  GLUCOSE 224* 163*  BUN 28* 36*  CREATININE 2.19* 2.28*  CALCIUM 8.6* 8.6*   GFR: Estimated Creatinine Clearance: 26.8 mL/min (A) (by C-G formula based on SCr  of 2.28 mg/dL (H)). Liver Function Tests: Recent Labs  Lab 09/13/18 0038 09/15/18 0854  AST 18 20  ALT 23 23  ALKPHOS 76 74  BILITOT 0.4 0.3  PROT 6.2* 6.3*  ALBUMIN 3.0* 2.8*   No results for input(s): LIPASE, AMYLASE in the last 168 hours. Recent Labs  Lab 09/15/18 1204  AMMONIA 20   Coagulation Profile: Recent Labs  Lab 09/15/18 0854  INR 1.06   Cardiac Enzymes: Recent Labs  Lab 09/15/18 2001 09/16/18 0206 09/16/18 0830  CKTOTAL 73  --   --   TROPONINI <0.03 <0.03 <0.03   BNP (last 3 results) No results for input(s): PROBNP in the last 8760 hours. HbA1C: Recent Labs    09/16/18 0206  HGBA1C 7.4*   CBG: Recent Labs  Lab 09/15/18 1619 09/15/18 1950 09/16/18 0019 09/16/18 0246 09/16/18 0723  GLUCAP 142* 128* 128* 121* 111*   Lipid Profile: Recent Labs  09/16/18 0206  CHOL 160  HDL 31*  LDLCALC 78  TRIG 255*  CHOLHDL 5.2   Thyroid Function Tests: Recent Labs    09/15/18 1204  TSH 3.260   Anemia Panel: No results for input(s): VITAMINB12, FOLATE, FERRITIN, TIBC, IRON, RETICCTPCT in the last 72 hours. Sepsis Labs: No results for input(s): PROCALCITON, LATICACIDVEN in the last 168 hours.  Recent Results (from the past 240 hour(s))  Urine culture     Status: Abnormal   Collection Time: 09/13/18  3:25 AM  Result Value Ref Range Status   Specimen Description   Final    URINE, CLEAN CATCH Performed at [facility], [address], [place], [place] [address]    Special Requests   Final    NONE Performed at [facility], [address] [place], [place] [address]    Culture >=100,000 COLONIES/mL ENTEROCOCCUS FAECALIS (A)  Final   Report Status 09/15/2018 FINAL  Final   Organism ID, Bacteria ENTEROCOCCUS FAECALIS (A)  Final      Susceptibility   Enterococcus faecalis - MIC*    AMPICILLIN <=2 SENSITIVE Sensitive     LEVOFLOXACIN 1 SENSITIVE Sensitive     NITROFURANTOIN <=16 SENSITIVE Sensitive     VANCOMYCIN 1 SENSITIVE Sensitive     *  >=100,000 COLONIES/mL ENTEROCOCCUS FAECALIS  MRSA PCR Screening     Status: None   Collection Time: 09/15/18 11:57 AM  Result Value Ref Range Status   MRSA by PCR NEGATIVE NEGATIVE Final    Comment:        The GeneXpert MRSA Assay (FDA approved for NASAL specimens only), is one component of a comprehensive MRSA colonization surveillance program. It is not intended to diagnose MRSA infection nor to guide or monitor treatment for MRSA infections. Performed at [facility], [address] [place], [place] [address]          Radiology Studies: Ct Angio Head W Or Wo Contrast  Result Date: 09/15/2018 CLINICAL DATA:  Altered level of consciousness.  Stroke. EXAM: CT ANGIOGRAPHY HEAD AND NECK CT PERFUSION BRAIN TECHNIQUE: Multidetector CT imaging of the head and neck was performed using the standard protocol during bolus administration of intravenous contrast. Multiplanar CT image reconstructions and MIPs were obtained to evaluate the vascular anatomy. Carotid stenosis measurements (when applicable) are obtained utilizing NASCET criteria, using the distal internal carotid diameter as the denominator. Multiphase CT imaging of the brain was performed following IV bolus contrast injection. Subsequent parametric perfusion maps were calculated using RAPID software. CONTRAST:  112mL ISOVUE-370 IOPAMIDOL (ISOVUE-370) INJECTION 76% COMPARISON:  Noncontrast head CT from earlier today. FINDINGS: CTA NECK FINDINGS Aortic arch: Atherosclerotic calcification.  Three vessel branching. Right carotid system: Vessels are smooth and widely patent. No noted atheromatous changes. Left carotid system: Vessels are smooth and widely patent. Mild calcified plaque at the ICA bulb. Vertebral arteries: Proximal subclavian arteries are widely patent. Symmetric vertebral arteries that are smooth and widely patent to the dura Skeleton: No acute finding Other neck: No significant incidental finding. Generalized mucosal  thickening in the paranasal sinuses. Upper chest: Tracheobronchomalacia with prominent airway narrowing. Bilateral cataract resection. Prominent mediastinal lymph nodes but not pathologic by short axis measurement. Review of the MIP images confirms the above findings CTA HEAD FINDINGS Anterior circulation: Atherosclerotic calcification of the carotid siphons. No branch occlusion, aneurysm, or flow limiting stenosis. Posterior circulation: Symmetric vertebral arteries. The vertebrobasilar arteries are smooth and widely patent. Fetal type right PCA. High-grade narrowing of the distal right P2 segment, stable from  07/27/2018 MRA Venous sinuses: Patent Anatomic variants: As above Delayed phase: Not obtained in the emergent setting Review of the MIP images confirms the above findings CT Brain Perfusion Findings: CBF (<30%) Volume: 29mL Perfusion (Tmax>6.0s) volume: 36mL IMPRESSION: 1. No emergent large vessel occlusion or infarct/ischemia by CT perfusion. 2. Very mild atherosclerosis in the neck. 3. Focal high-grade right P2 segment stenosis that is stable from prior MRA 4. Incidental prominent tracheobronchomalacia. Electronically Signed   By: Monte Fantasia M.D.   On: 09/15/2018 09:52   Ct Head Wo Contrast  Result Date: 09/15/2018 CLINICAL DATA:  LEFT-sided weakness since last night, slurred speech this morning, altered level of consciousness, history CHF, stage III chronic kidney disease, COPD, essential hypertension, type II diabetes mellitus, prior pulmonary embolism EXAM: CT HEAD WITHOUT CONTRAST TECHNIQUE: Contiguous axial images were obtained from the base of the skull through the vertex without intravenous contrast. Sagittal and coronal MPR images reconstructed from axial data set. COMPARISON:  08/22/2018 FINDINGS: Brain: Generalized atrophy. Normal ventricular morphology. No midline shift or mass effect. Small vessel chronic ischemic changes of deep cerebral white matter. Old lacunar infarct versus prominent  perivascular space at LEFT basal ganglia. No intracranial hemorrhage, mass lesion, evidence of acute infarction, or extra-axial fluid collection. Vascular: Minimal atherosclerotic calcifications of internal carotid arteries at skull base. Skull: Intact.  Scattered dural calcifications bilaterally. Sinuses/Orbits: Mucosal thickening throughout ethmoid air cells and maxillary sinuses. Small mucosal retention cysts within maxillary sinuses. Other: N/A IMPRESSION: Atrophy with small vessel chronic ischemic changes of deep cerebral white matter. Old LEFT basal ganglia lacunar infarct versus prominent perivascular space unchanged. No acute intracranial abnormalities. Electronically Signed   By: Lavonia Dana M.D.   On: 09/15/2018 08:29   Ct Angio Neck W And/or Wo Contrast  Result Date: 09/15/2018 CLINICAL DATA:  Altered level of consciousness.  Stroke. EXAM: CT ANGIOGRAPHY HEAD AND NECK CT PERFUSION BRAIN TECHNIQUE: Multidetector CT imaging of the head and neck was performed using the standard protocol during bolus administration of intravenous contrast. Multiplanar CT image reconstructions and MIPs were obtained to evaluate the vascular anatomy. Carotid stenosis measurements (when applicable) are obtained utilizing NASCET criteria, using the distal internal carotid diameter as the denominator. Multiphase CT imaging of the brain was performed following IV bolus contrast injection. Subsequent parametric perfusion maps were calculated using RAPID software. CONTRAST:  130mL ISOVUE-370 IOPAMIDOL (ISOVUE-370) INJECTION 76% COMPARISON:  Noncontrast head CT from earlier today. FINDINGS: CTA NECK FINDINGS Aortic arch: Atherosclerotic calcification.  Three vessel branching. Right carotid system: Vessels are smooth and widely patent. No noted atheromatous changes. Left carotid system: Vessels are smooth and widely patent. Mild calcified plaque at the ICA bulb. Vertebral arteries: Proximal subclavian arteries are widely patent.  Symmetric vertebral arteries that are smooth and widely patent to the dura Skeleton: No acute finding Other neck: No significant incidental finding. Generalized mucosal thickening in the paranasal sinuses. Upper chest: Tracheobronchomalacia with prominent airway narrowing. Bilateral cataract resection. Prominent mediastinal lymph nodes but not pathologic by short axis measurement. Review of the MIP images confirms the above findings CTA HEAD FINDINGS Anterior circulation: Atherosclerotic calcification of the carotid siphons. No branch occlusion, aneurysm, or flow limiting stenosis. Posterior circulation: Symmetric vertebral arteries. The vertebrobasilar arteries are smooth and widely patent. Fetal type right PCA. High-grade narrowing of the distal right P2 segment, stable from 07/27/2018 MRA Venous sinuses: Patent Anatomic variants: As above Delayed phase: Not obtained in the emergent setting Review of the MIP images confirms the above findings CT Brain Perfusion  Findings: CBF (<30%) Volume: 35mL Perfusion (Tmax>6.0s) volume: 25mL IMPRESSION: 1. No emergent large vessel occlusion or infarct/ischemia by CT perfusion. 2. Very mild atherosclerosis in the neck. 3. Focal high-grade right P2 segment stenosis that is stable from prior MRA 4. Incidental prominent tracheobronchomalacia. Electronically Signed   By: Monte Fantasia M.D.   On: 09/15/2018 09:52   Mr Brain Wo Contrast  Result Date: 09/15/2018 CLINICAL DATA:  Hallucinations.  Slurred speech. EXAM: MRI HEAD WITHOUT CONTRAST TECHNIQUE: Multiplanar, multiecho pulse sequences of the brain and surrounding structures were obtained without intravenous contrast. COMPARISON:  CTA and CTP 09/15/2018.  MR head 07/27/2018. FINDINGS: The patient was unable to remain motionless for the exam. Small or subtle lesions could be overlooked. Brain: No evidence for acute infarction, hemorrhage, mass lesion, hydrocephalus, or extra-axial fluid. Cerebral and cerebellar atrophy,  premature for age. Mild subcortical and periventricular T2 and FLAIR hyperintensities, likely chronic microvascular ischemic change. Vascular: Flow voids are maintained. Skull and upper cervical spine: Normal marrow signal. Sinuses/Orbits: Chronic paranasal sinus disease. No layering fluid. Negative orbits. Other: None. IMPRESSION: Atrophy and small vessel disease.  No acute intracranial findings. Electronically Signed   By: Staci Righter M.D.   On: 09/15/2018 15:40   US Carotid Bilateral (at [facility] Only)  Result Date: 09/15/2018 CLINICAL DATA:  Altered mental status, hypertension, hyperlipidemia EXAM: BILATERAL CAROTID DUPLEX ULTRASOUND TECHNIQUE: Pearline Cables scale imaging, color Doppler and duplex ultrasound were performed of bilateral carotid and vertebral arteries in the neck. COMPARISON:  None. FINDINGS: Criteria: Quantification of carotid stenosis is based on velocity parameters that correlate the residual internal carotid diameter with NASCET-based stenosis levels, using the diameter of the distal internal carotid lumen as the denominator for stenosis measurement. The following velocity measurements were obtained: RIGHT ICA: 93/23 cm/sec CCA: 841/32 cm/sec SYSTOLIC ICA/CCA RATIO:  0.8 ECA: 99 cm/sec LEFT ICA: 117/32 cm/sec CCA: 440/10 cm/sec SYSTOLIC ICA/CCA RATIO:  1.0 ECA: 107 cm/sec RIGHT CAROTID ARTERY: No significant atherosclerotic plaque formation. No hemodynamically significant right ICA stenosis, velocity elevation, or turbulent flow. Degree of narrowing less than 50%. RIGHT VERTEBRAL ARTERY:  Antegrade LEFT CAROTID ARTERY: minor echogenic plaque formation. No hemodynamically significant left ICA stenosis, velocity elevation, or turbulent flow. LEFT VERTEBRAL ARTERY:  Antegrade IMPRESSION: Minor carotid atherosclerosis. No hemodynamically significant ICA stenosis. Degree of narrowing less than 50% bilaterally by ultrasound criteria. Patent antegrade vertebral flow bilaterally Electronically Signed    By: Jerilynn Mages.  Shick M.D.   On: 09/15/2018 16:16   Ct Cerebral Perfusion W Contrast  Result Date: 09/15/2018 CLINICAL DATA:  Altered level of consciousness.  Stroke. EXAM: CT ANGIOGRAPHY HEAD AND NECK CT PERFUSION BRAIN TECHNIQUE: Multidetector CT imaging of the head and neck was performed using the standard protocol during bolus administration of intravenous contrast. Multiplanar CT image reconstructions and MIPs were obtained to evaluate the vascular anatomy. Carotid stenosis measurements (when applicable) are obtained utilizing NASCET criteria, using the distal internal carotid diameter as the denominator. Multiphase CT imaging of the brain was performed following IV bolus contrast injection. Subsequent parametric perfusion maps were calculated using RAPID software. CONTRAST:  171mL ISOVUE-370 IOPAMIDOL (ISOVUE-370) INJECTION 76% COMPARISON:  Noncontrast head CT from earlier today. FINDINGS: CTA NECK FINDINGS Aortic arch: Atherosclerotic calcification.  Three vessel branching. Right carotid system: Vessels are smooth and widely patent. No noted atheromatous changes. Left carotid system: Vessels are smooth and widely patent. Mild calcified plaque at the ICA bulb. Vertebral arteries: Proximal subclavian arteries are widely patent. Symmetric vertebral arteries that are smooth and  widely patent to the dura Skeleton: No acute finding Other neck: No significant incidental finding. Generalized mucosal thickening in the paranasal sinuses. Upper chest: Tracheobronchomalacia with prominent airway narrowing. Bilateral cataract resection. Prominent mediastinal lymph nodes but not pathologic by short axis measurement. Review of the MIP images confirms the above findings CTA HEAD FINDINGS Anterior circulation: Atherosclerotic calcification of the carotid siphons. No branch occlusion, aneurysm, or flow limiting stenosis. Posterior circulation: Symmetric vertebral arteries. The vertebrobasilar arteries are smooth and widely  patent. Fetal type right PCA. High-grade narrowing of the distal right P2 segment, stable from 07/27/2018 MRA Venous sinuses: Patent Anatomic variants: As above Delayed phase: Not obtained in the emergent setting Review of the MIP images confirms the above findings CT Brain Perfusion Findings: CBF (<30%) Volume: 69mL Perfusion (Tmax>6.0s) volume: 63mL IMPRESSION: 1. No emergent large vessel occlusion or infarct/ischemia by CT perfusion. 2. Very mild atherosclerosis in the neck. 3. Focal high-grade right P2 segment stenosis that is stable from prior MRA 4. Incidental prominent tracheobronchomalacia. Electronically Signed   By: Monte Fantasia M.D.   On: 09/15/2018 09:52   Dg Chest Portable 1 View  Result Date: 09/15/2018 CLINICAL DATA:  68 y/o  F; wheezing and cough. EXAM: PORTABLE CHEST 1 VIEW COMPARISON:  08/16/2018 chest radiograph FINDINGS: Stable cardiomegaly given projection and technique. Pulmonary vascular congestion. No focal consolidation. No pleural effusion or pneumothorax. No acute osseous abnormality is evident. IMPRESSION: Cardiomegaly and pulmonary vascular congestion. No focal consolidation. Electronically Signed   By: Kristine Garbe M.D.   On: 09/15/2018 04:38        Scheduled Meds: . aspirin  300 mg Rectal Daily   Or  . aspirin  325 mg Oral Daily  . atorvastatin  40 mg Oral QHS  . chlorhexidine  15 mL Mouth Rinse BID  . heparin  5,000 Units Subcutaneous Q8H  . insulin aspart  0-5 Units Subcutaneous QHS  . insulin aspart  0-9 Units Subcutaneous Q6H  . levothyroxine  12.5 mcg Intravenous Daily  . mouth rinse  15 mL Mouth Rinse q12n4p  . risperiDONE  0.5 mg Oral BID   Continuous Infusions: . sodium chloride 75 mL/hr at 09/16/18 0619  . [START ON 09/17/2018] levofloxacin (LEVAQUIN) IV       LOS: 0 days    Time spent: 30 minutes    Bomani Oommen Darleen Crocker, DO [facility] Pager [phone]  If 7PM-7AM, please contact night-coverage [email or web address] Password  [facility] 09/16/2018, 2:27 PM

## 2018-09-16 NOTE — Progress Notes (Signed)
LCSW made new note stating that pt will not be going back to [facility] facility but will have to go to [facility] for treatment and be discharged from there. Notified pt and stated an understanding to.

## 2018-09-16 NOTE — Progress Notes (Signed)
Benjamine Mola called from [facility] states that per her assessment pt needs to go to [facility] and has been approved and is awaiting bed. Notified Dr. Manuella Ghazi and he will be in contact with Benjamine Mola at [place]

## 2018-09-16 NOTE — Progress Notes (Signed)
Patient resting quietly at this time. -repeat EKG and troponin completed

## 2018-09-16 NOTE — Progress Notes (Signed)
Patient resting in no apparent distress with RR and effort WDP. -medication administration intervention effective

## 2018-09-16 NOTE — Progress Notes (Signed)
LCSW called and spoke to [name] at [facility] and explained the information that was provided from [place] reported that on Jan 13 th and 14th she was in contact with [facility] and they verified they received over 100 pages from the facility and had patient is on wait list. [name] will follow up with [facility] on Monday.  Coverage LCSW will leave a handoff off for weekday CSW to continue to work on suitable placement.   [employer] LCSW [phone]

## 2018-09-16 NOTE — Progress Notes (Signed)
LCSW Called [facility] and requested a room number and call report number as per the [place]- This patient is to go to [facility] in [place].   Patient will be NOT BE RETURNING TO [facility] facility until after she is treated and discharged to [facility] informed Dr Manuella Ghazi of this patients situation.  LCSW called [place] spoke to [facility] and will call this worker back- will await call back    [employer] LCSW [phone]

## 2018-09-16 NOTE — Progress Notes (Signed)
Pharmacy Antibiotic Note  [name] is a 68 y.o. female admitted on 09/12/2018.    Pharmacy has been consulted for levofloxacin dosing for UTI.  Patient received one dose of levofloxacin 500mg  IV in the ED early this morning.  Urine culture is growing 100,000 CFU of  E. Faecalis, sensitive to ampicillin, levofloxacin and vancomycin, but patient has a severe PCN allergy.    Plan: Start Levaquin 250mg  IV q24h on 09-17-18 at 1000. Pharmacy will continue to monitor patient's labs, cultures and progress.  Height: 5\' 3"  (160 cm) Weight: 212 lb 11.9 oz (96.5 kg) IBW/kg (Calculated) : 52.4  Temp (24hrs), Avg:98.3 F (36.8 C), Min:97.4 F (36.3 C), Max:100.3 F (37.9 C)  Recent Labs  Lab 09/13/18 0038 09/15/18 0854  WBC 6.4 6.2  CREATININE 2.19* 2.28*    Estimated Creatinine Clearance: 26.8 mL/min (A) (by C-G formula based on SCr of 2.28 mg/dL (H)).    Allergies  Allergen Reactions  . Benztropine Mesylate Other (See Comments)    Alopecia and white spots on eyes  . Codeine Swelling, Palpitations and Other (See Comments)    Mouth Swelling and Tachycardia   . Diazepam Other (See Comments)    COMA  . Diphenhydramine Hcl Anxiety, Palpitations and Other (See Comments)    Tachycardia and anxiousness  . Penicillins Other (See Comments)    REACTION: ulcers in mouth, swelling, throat swelling  Has patient had a PCN reaction causing immediate rash, facial/tongue/throat swelling, SOB or lightheadedness with hypotension: YES Has patient had a PCN reaction causing severe rash involving Mucus membranes or skin necrosis: Unknown Has patient had a PCN reaction that required hospitalization Unknown Has patient had a PCN reaction occurring within the last 10 years: unknown If all of the above answers are "NO", then may proceed with Cephalospori  . Sulfonamide Derivatives Rash    Blisters, also  . Thioridazine Hcl Swelling  . Benztropine Other (See Comments)    "Alopecia and white spots on eyes"   . Penicillin G Swelling and Other (See Comments)    Ulcers in mouth & tongue became swollen Has patient had a PCN reaction causing immediate rash, facial/tongue/throat swelling, SOB or lightheadedness with hypotension: Yes Has patient had a PCN reaction causing severe rash involving mucus membranes or skin necrosis: Unk Has patient had a PCN reaction that required hospitalization: Unk Has patient had a PCN reaction occurring within the last 10 years: Unk If all of the above answers are "NO", then may proceed with Cephalosporin use.   [name] Kitchen Lisinopril Cough  . Sulfamethoxazole Rash    Antimicrobials this admission: Levofloxacin 2/1 >>     Microbiology results:  1/29 UCx:  >100,000 CFU of  E. faecalis  (sens to levofloxacin/ampicillin/vanc)  1/31 MRSA PCR: negative  Thank you for allowing pharmacy to be a part of this patient's care.  Despina Pole, Pharm. D. Clinical Pharmacist 09/16/2018 11:46 AM

## 2018-09-16 NOTE — Progress Notes (Signed)
As per the bedside sitter the patient has become increasingly restless and agitated (pulling at lines and attempting to get OOB) -mitts applied bilaterally -patient tolerated well

## 2018-09-16 NOTE — Progress Notes (Signed)
                        I have spoken with Ms [name]   And she advised that there is nothing sfe can do at this time.                                                                                      Toy Baker RN/AC

## 2018-09-16 NOTE — Progress Notes (Signed)
Dr. Manuella Ghazi called and gave notice that pt will be d/c back to ALF today and that the pt is clear for discharge and no longer needs a sitter.

## 2018-09-16 NOTE — Progress Notes (Signed)
Patient is seen by me via tele-psych and I have consulted with Dr. Mallie Darting.  Patient denies any current suicidal homicidal ideations.  She reports that she does have hallucinations of demons that talk to her and have told her that she is worthless and that she should not be alive.  However the patient reports that these are the exact same hallucinations; same voice, same visions, same comments; since she was 68 years old.  She reports that she has a long history of numerous medications and numerous hospitalizations to resolve her hallucinations but nothing has ever worked and they have remained with her for almost 66 years now.  Patient reports that she stays at a assisted living facility and she likes it there.  She also reports that she is seen by a psychiatrist there.  She states that the other day she did attempt to put a bag over her head but felt that it was partially due to her being sick due to her UTI and making her confused.  Today she states that she feels safe but feels that she needs her medications adjusted.  I did consult with Dr. Mallie Darting and the recommendation was to increase her Risperdal to 1 mg at night and 0.5 mg in the morning.  I have paged Dr. Manuella Ghazi and I am waiting for a return call to discuss patient's recommendations, but at this time patient does not meet inpatient criteria and is psychiatrically cleared.

## 2018-09-16 NOTE — Progress Notes (Signed)
LCSW called APS coverage worker Guinevere Scarlet -On call DSS worker after hours number [phone] and filed a report on behalf of the patient.  LCSW explained the patient has been psychiatrically cleared by our [facility] specialists and should return, Patient was denied re-entry by Director of [employer]  LCSW also questioned 30 day written notice to facility director ,she directed me to call APS. LCSW also reported to APS she does not meet any criteria for an in patient [facility]- stay and has been medically and psychiatrically cleared.  APS worker Ms Kennis Carina will speak to her supervisor and will touch base with AP- AC Tim Goins.   Ella Golomb LCSW

## 2018-09-16 NOTE — Progress Notes (Signed)
Mitts removed at this time at the patient has become more relaxed and is resting and not attempting to get OOB.

## 2018-09-17 DIAGNOSIS — I130 Hypertensive heart and chronic kidney disease with heart failure and stage 1 through stage 4 chronic kidney disease, or unspecified chronic kidney disease: Secondary | ICD-10-CM | POA: Diagnosis not present

## 2018-09-17 DIAGNOSIS — N390 Urinary tract infection, site not specified: Secondary | ICD-10-CM | POA: Diagnosis not present

## 2018-09-17 DIAGNOSIS — N184 Chronic kidney disease, stage 4 (severe): Secondary | ICD-10-CM | POA: Diagnosis not present

## 2018-09-17 DIAGNOSIS — R4701 Aphasia: Secondary | ICD-10-CM | POA: Diagnosis not present

## 2018-09-17 DIAGNOSIS — G9340 Encephalopathy, unspecified: Secondary | ICD-10-CM | POA: Diagnosis not present

## 2018-09-17 DIAGNOSIS — L8990 Pressure ulcer of unspecified site, unspecified stage: Secondary | ICD-10-CM

## 2018-09-17 DIAGNOSIS — R45851 Suicidal ideations: Secondary | ICD-10-CM | POA: Diagnosis not present

## 2018-09-17 DIAGNOSIS — G9341 Metabolic encephalopathy: Secondary | ICD-10-CM | POA: Diagnosis not present

## 2018-09-17 LAB — RETICULOCYTES
Immature Retic Fract: 15.6 % (ref 2.3–15.9)
RBC.: 2.52 MIL/uL — ABNORMAL LOW (ref 3.87–5.11)
Retic Count, Absolute: 73.6 10*3/uL (ref 19.0–186.0)
Retic Ct Pct: 2.9 % (ref 0.4–3.1)

## 2018-09-17 LAB — CBC
HCT: 23.4 % — ABNORMAL LOW (ref 36.0–46.0)
Hemoglobin: 7.6 g/dL — ABNORMAL LOW (ref 12.0–15.0)
MCH: 31.7 pg (ref 26.0–34.0)
MCHC: 32.5 g/dL (ref 30.0–36.0)
MCV: 97.5 fL (ref 80.0–100.0)
Platelets: 155 10*3/uL (ref 150–400)
RBC: 2.4 MIL/uL — ABNORMAL LOW (ref 3.87–5.11)
RDW: 13.3 % (ref 11.5–15.5)
WBC: 6 10*3/uL (ref 4.0–10.5)
nRBC: 0 % (ref 0.0–0.2)

## 2018-09-17 LAB — IRON AND TIBC
Iron: 72 ug/dL (ref 28–170)
Saturation Ratios: 28 % (ref 10.4–31.8)
TIBC: 258 ug/dL (ref 250–450)
UIBC: 186 ug/dL

## 2018-09-17 LAB — GLUCOSE, CAPILLARY
Glucose-Capillary: 159 mg/dL — ABNORMAL HIGH (ref 70–99)
Glucose-Capillary: 178 mg/dL — ABNORMAL HIGH (ref 70–99)
Glucose-Capillary: 239 mg/dL — ABNORMAL HIGH (ref 70–99)
Glucose-Capillary: 234 mg/dL — ABNORMAL HIGH (ref 70–99)
Glucose-Capillary: 212 mg/dL — ABNORMAL HIGH (ref 70–99)

## 2018-09-17 LAB — BASIC METABOLIC PANEL
Anion gap: 7 (ref 5–15)
BUN: 31 mg/dL — ABNORMAL HIGH (ref 8–23)
CO2: 21 mmol/L — ABNORMAL LOW (ref 22–32)
Calcium: 8.7 mg/dL — ABNORMAL LOW (ref 8.9–10.3)
Chloride: 112 mmol/L — ABNORMAL HIGH (ref 98–111)
Creatinine, Ser: 2.04 mg/dL — ABNORMAL HIGH (ref 0.44–1.00)
GFR calc Af Amer: 29 mL/min — ABNORMAL LOW (ref >60)
GFR calc non Af Amer: 25 mL/min — ABNORMAL LOW (ref >60)
Glucose, Bld: 181 mg/dL — ABNORMAL HIGH (ref 70–99)
Potassium: 3.9 mmol/L (ref 3.5–5.1)
Sodium: 140 mmol/L (ref 135–145)

## 2018-09-17 LAB — VITAMIN B12: Vitamin B-12: 534 pg/mL (ref 180–914)

## 2018-09-17 LAB — FERRITIN: Ferritin: 23 ng/mL (ref 11–307)

## 2018-09-17 LAB — FOLATE: Folate: 28.3 ng/mL (ref >5.9)

## 2018-09-17 MED ORDER — SPIRONOLACTONE 25 MG PO TABS
25.0000 mg | ORAL_TABLET | Freq: Every day | ORAL | Status: DC
  Administered 2018-09-17 – 2018-09-19 (×3): 25 mg via ORAL
  Filled 2018-09-17 (×3): qty 1

## 2018-09-17 MED ORDER — LEVOFLOXACIN 500 MG PO TABS
250.0000 mg | ORAL_TABLET | Freq: Every day | ORAL | Status: DC
  Administered 2018-09-17 – 2018-09-18 (×2): 250 mg via ORAL
  Filled 2018-09-17 (×2): qty 1

## 2018-09-17 MED ORDER — LEVOTHYROXINE SODIUM 25 MCG PO TABS: 25.0000 ug | ORAL_TABLET | Freq: Every day | ORAL | Status: DC

## 2018-09-17 MED ORDER — LEVETIRACETAM 500 MG PO TABS
500.0000 mg | ORAL_TABLET | Freq: Two times a day (BID) | ORAL | Status: DC
  Administered 2018-09-17 – 2018-09-19 (×5): 500 mg via ORAL
  Filled 2018-09-17 (×5): qty 1

## 2018-09-17 MED ORDER — MONTELUKAST SODIUM 10 MG PO TABS
10.0000 mg | ORAL_TABLET | Freq: Every day | ORAL | Status: DC
  Administered 2018-09-17 – 2018-09-18 (×2): 10 mg via ORAL
  Filled 2018-09-17 (×2): qty 1

## 2018-09-17 MED ORDER — AMLODIPINE BESYLATE 5 MG PO TABS
5.0000 mg | ORAL_TABLET | Freq: Every day | ORAL | Status: DC
  Administered 2018-09-17 – 2018-09-19 (×3): 5 mg via ORAL
  Filled 2018-09-17 (×3): qty 1

## 2018-09-17 MED ORDER — INSULIN GLARGINE 100 UNIT/ML ~~LOC~~ SOLN
20.0000 [IU] | Freq: Every day | SUBCUTANEOUS | Status: DC
  Administered 2018-09-17 – 2018-09-18 (×2): 20 [IU] via SUBCUTANEOUS
  Filled 2018-09-17 (×3): qty 0.2

## 2018-09-17 MED ORDER — GABAPENTIN 300 MG PO CAPS
300.0000 mg | ORAL_CAPSULE | Freq: Two times a day (BID) | ORAL | Status: DC
  Administered 2018-09-17 – 2018-09-19 (×5): 300 mg via ORAL
  Filled 2018-09-17 (×5): qty 1

## 2018-09-17 MED ORDER — LEVOTHYROXINE SODIUM 25 MCG PO TABS
25.0000 ug | ORAL_TABLET | Freq: Every day | ORAL | Status: DC
  Administered 2018-09-18 – 2018-09-19 (×2): 25 ug via ORAL
  Filled 2018-09-17 (×2): qty 1

## 2018-09-17 MED ORDER — VITAMIN B-6 50 MG PO TABS
200.0000 mg | ORAL_TABLET | Freq: Two times a day (BID) | ORAL | Status: DC
  Administered 2018-09-17 – 2018-09-19 (×5): 200 mg via ORAL
  Filled 2018-09-17 (×5): qty 4

## 2018-09-17 MED ORDER — CARVEDILOL 3.125 MG PO TABS
6.2500 mg | ORAL_TABLET | Freq: Two times a day (BID) | ORAL | Status: DC
  Administered 2018-09-17 – 2018-09-19 (×4): 6.25 mg via ORAL
  Filled 2018-09-17 (×4): qty 2

## 2018-09-17 MED ORDER — ASPIRIN EC 81 MG PO TBEC
81.0000 mg | DELAYED_RELEASE_TABLET | Freq: Every day | ORAL | Status: DC
  Administered 2018-09-17 – 2018-09-19 (×3): 81 mg via ORAL
  Filled 2018-09-17 (×3): qty 1

## 2018-09-17 MED ORDER — HALOPERIDOL 0.5 MG PO TABS
0.5000 mg | ORAL_TABLET | Freq: Two times a day (BID) | ORAL | Status: DC
  Administered 2018-09-17 – 2018-09-19 (×5): 0.5 mg via ORAL
  Filled 2018-09-17 (×6): qty 1

## 2018-09-17 MED ORDER — DOCUSATE SODIUM 100 MG PO CAPS
100.0000 mg | ORAL_CAPSULE | Freq: Every day | ORAL | Status: DC
  Administered 2018-09-17 – 2018-09-19 (×3): 100 mg via ORAL
  Filled 2018-09-17 (×3): qty 1

## 2018-09-17 NOTE — BHH Counselor (Addendum)
Pt was reassessed.  She reported continued visual hallucinations -- seeing demons.   She acknowledged suicidal ideation when experiencing body pain.    Per notes, Pt has been psych-cleared and may return to nursing home.  From reassessment:  Patient states that she is not experiencing any pain issues today and she states that because of this that she is not feeling suicidal today.  Patient states that when she is in pain that she feels suicidal.  Patient states, however, that she continues to hear voices, non-command in nature and she states that she continues to feel demons inside of her.  Patient states that these demons will not leave her alone and that they follow her and she states that they keep her from sleeping.

## 2018-09-17 NOTE — Progress Notes (Signed)
PHARMACIST - PHYSICIAN COMMUNICATION  DR:   Manuella Ghazi  CONCERNING: IV- to- Oral Route Change Policy  RECOMMENDATION: This patient is receiving levofloxacin and levothyroxine by the intravenous route.  Based on criteria approved by the Pharmacy and Therapeutics Committee, the intravenous medication(s) is/are being converted to the equivalent oral dose form(s).   DESCRIPTION: These criteria include:  The patient is eating (either orally or via tube) and/or has been taking other orally administered medications for a least 24 hours  The patient has no evidence of active gastrointestinal bleeding or impaired GI absorption (gastrectomy, short bowel, patient on TNA or NPO).  If you have questions about this conversion, please contact the Pharmacy Department  [x]   [phone] )  [facility] []   [phone] )  [facility] []   [phone] )  [facility] []   [phone] )  [facility] []   [phone] )  [place], [facility] 09/17/2018 8:39 AM

## 2018-09-17 NOTE — Progress Notes (Signed)
PROGRESS NOTE    [name]  EVO:[id number] DOB: 1950-09-17 DOA: 09/12/2018 PCP: Glenda Chroman, MD   Brief Narrative:  Per HPI: Martyna Archeris a 68 y.o.femalewith medical history significant forschizoaffective disorder, CKD stage IV, chronic anemia, hypertension, dyslipidemia, type 2 diabetes, hypothyroidism, obesity, prior CVA, and questionable prior seizure activity who was brought to the ED on 1/29 from her assisted living facility where she was found by the nurse with a plastic bag around her head. It appears that she had this bag around her head and an attempt to harm herself and stated to the nurse at that time that she was seeing demons and monsters that did not leave her alone. She continued to have hallucinations with potential suicidal ideation for which she was initially given some Haldol and was assessed by psychiatry. She awaited bed placement to inpatient psychiatry in the ED and was started on Risperdal 0.5 mg twice a day for brief psychotic disorder. She had remained stable in the ED until this morning at which point she became much less responsive and had some slurred speech for which she underwent stroke evaluation. She underwent multiple imaging studies with CTA as well as perfusion study with no acute findings noted. Tele-neurology was consulted and recommended further evaluation with EEG and brain MRI.  Patient has been admitted with acute encephalopathy-suspect metabolic with UTI secondary to enterococcus.  She has been started on IV Levaquin, now transitioned to PO, and has been revisited by TTS today and noted to be stable for discharge on twice daily Risperdal.   Assessment & Plan:   Principal Problem:   Acute encephalopathy Active Problems:   Normocytic anemia   Schizophrenia ([place])   Essential hypertension   Chronic diastolic heart failure (HCC)   Cerebral infarction ([place])   CKD (chronic kidney disease), stage IV (HCC)   Type 2 diabetes mellitus  ([place])   Hypothyroidism   Pressure injury of skin  1. Acute metabolic encephalopathy in the setting of brief psychotic disorder with hallucinations and suicidal ideation-resolved. EEG and MRI with no acute findings. Patient has been transferred to medical floor at this time.  2D echocardiogram with LVEF 60 to 65% and no acute findings as well as carotid ultrasounds with less than 50% stenosis noted bilaterally.  LDL of 78 and therefore, will continue statin.  Hemoglobin A1c of 7.4%.  TSH in normal range of 3.26. No further need for sitter at bedside at this time per TTS.  She will be placed on Risperdal 1 mg at bedtime and 0.5 mg in the morning.  Appears to have UTI which is a significant contributing factor.   2. Enterococcal UTI.    Continue on oral Levaquin. 3. CKD stage IV. Patient appears to be at baseline with usual creatinine between 2.1-2.3. Continue to monitor carefully. No further need for IV fluid as patient is tolerating diet. 4. Chronic anemia. Appears to be at baseline with no overt bleeding identified. Will recheck CBC in a.m. anemia panel ordered. 5. Hypothyroidism. Maintain on Synthroid and TSH checked and found to be within normal limits. 6. Chronic grade 1 diastolic heart failure. Does not appear to be in any heart failure at this time and will check 2D echocardiogram as noted above. Maintain on daily weights. Withhold any diuretics at this time until patient is able to tolerate oral intake. 7. Type 2 diabetes. Noted with some mild hyperglycemia at this time. Will check hemoglobin A1c which was 7.4% and maintain on SSI coverage. 8. Prior CVA.  Maintain on statin and full dose aspirin. 9. GERD. PPI IV daily.  DVT prophylaxis: Heparin Code Status: Full Family Communication: None at bedside Disposition Plan: Placement being worked on by social workers.  No CVA or findings noted on EEG.  Continue on Levaquin for UTI.   Consultants:    Neurology  TTS  Procedures:   EEG performed on 09/15/2018  Antimicrobials:   IV Levaquin 2/1->2/1  PO Levaquin 2/2->  Subjective: Patient seen and evaluated today with no new acute complaints or concerns. No acute concerns or events noted overnight.  Patient is complaining of some neuropathic pain and would like to have her home gabapentin resumed.  Objective: Vitals:   09/16/18 2131 09/16/18 2242 09/17/18 0300 09/17/18 0611  BP:  (!) 186/80  (!) 167/77  Pulse:  85  83  Resp:  (!) 21  16  Temp:  98.2 F (36.8 C)  97.8 F (36.6 C)  TempSrc:  Oral  Oral  SpO2: 97% 98% 97% 96%  Weight:      Height:        Intake/Output Summary (Last 24 hours) at 09/17/2018 1026 Last data filed at 09/17/2018 0900 Gross per 24 hour  Intake 1069.8 ml  Output 1050 ml  Net 19.8 ml   Filed Weights   09/14/18 0244 09/15/18 1154 09/16/18 0500  Weight: 96.5 kg 99.2 kg 96.5 kg    Examination:  General exam: Appears calm and comfortable  Respiratory system: Clear to auscultation. Respiratory effort normal. Cardiovascular system: S1 & S2 heard, RRR. No JVD, murmurs, rubs, gallops or clicks. No pedal edema. Gastrointestinal system: Abdomen is nondistended, soft and nontender. No organomegaly or masses felt. Normal bowel sounds heard. Central nervous system: Alert and oriented. No focal neurological deficits. Extremities: Symmetric 5 x 5 power. Skin: No rashes, lesions or ulcers Psychiatry: Judgement and insight appear normal. Mood & affect appropriate.     Data Reviewed: I have personally reviewed following labs and imaging studies  CBC: Recent Labs  Lab 09/13/18 0038 09/15/18 0854 09/17/18 0448  WBC 6.4 6.2 6.0  NEUTROABS 4.5 4.1  --   HGB 8.6* 8.2* 7.6*  HCT 25.9* 25.1* 23.4*  MCV 95.9 96.9 97.5  PLT 160 152 825   Basic Metabolic Panel: Recent Labs  Lab 09/13/18 0038 09/15/18 0854 09/17/18 0448  NA 137 140 140  K 3.7 3.7 3.9  CL 107 110 112*  CO2 24 23 21*  GLUCOSE  224* 163* 181*  BUN 28* 36* 31*  CREATININE 2.19* 2.28* 2.04*  CALCIUM 8.6* 8.6* 8.7*   GFR: Estimated Creatinine Clearance: 30 mL/min (A) (by C-G formula based on SCr of 2.04 mg/dL (H)). Liver Function Tests: Recent Labs  Lab 09/13/18 0038 09/15/18 0854  AST 18 20  ALT 23 23  ALKPHOS 76 74  BILITOT 0.4 0.3  PROT 6.2* 6.3*  ALBUMIN 3.0* 2.8*   No results for input(s): LIPASE, AMYLASE in the last 168 hours. Recent Labs  Lab 09/15/18 1204  AMMONIA 20   Coagulation Profile: Recent Labs  Lab 09/15/18 0854  INR 1.06   Cardiac Enzymes: Recent Labs  Lab 09/15/18 2001 09/16/18 0206 09/16/18 0830 09/16/18 1737  CKTOTAL 73  --   --   --   TROPONINI <0.03 <0.03 <0.03 <0.03   BNP (last 3 results) No results for input(s): PROBNP in the last 8760 hours. HbA1C: Recent Labs    09/16/18 0206  HGBA1C 7.4*   CBG: Recent Labs  Lab 09/16/18 0723 09/16/18 1552 09/16/18 2301  09/17/18 0536 09/17/18 0816  GLUCAP 111* 233* 196* 159* 178*   Lipid Profile: Recent Labs    09/16/18 0206  CHOL 160  HDL 31*  LDLCALC 78  TRIG 255*  CHOLHDL 5.2   Thyroid Function Tests: Recent Labs    09/15/18 1204  TSH 3.260   Anemia Panel: No results for input(s): VITAMINB12, FOLATE, FERRITIN, TIBC, IRON, RETICCTPCT in the last 72 hours. Sepsis Labs: No results for input(s): PROCALCITON, LATICACIDVEN in the last 168 hours.  Recent Results (from the past 240 hour(s))  Urine culture     Status: Abnormal   Collection Time: 09/13/18  3:25 AM  Result Value Ref Range Status   Specimen Description   Final    URINE, CLEAN CATCH Performed at [facility], [address] [place], [place] [address]    Special Requests   Final    NONE Performed at [facility], [address] [place], [place] [address]    Culture >=100,000 COLONIES/mL ENTEROCOCCUS FAECALIS (A)  Final   Report Status 09/15/2018 FINAL  Final   Organism ID, Bacteria ENTEROCOCCUS FAECALIS (A)  Final      Susceptibility    Enterococcus faecalis - MIC*    AMPICILLIN <=2 SENSITIVE Sensitive     LEVOFLOXACIN 1 SENSITIVE Sensitive     NITROFURANTOIN <=16 SENSITIVE Sensitive     VANCOMYCIN 1 SENSITIVE Sensitive     * >=100,000 COLONIES/mL ENTEROCOCCUS FAECALIS  MRSA PCR Screening     Status: None   Collection Time: 09/15/18 11:57 AM  Result Value Ref Range Status   MRSA by PCR NEGATIVE NEGATIVE Final    Comment:        The GeneXpert MRSA Assay (FDA approved for NASAL specimens only), is one component of a comprehensive MRSA colonization surveillance program. It is not intended to diagnose MRSA infection nor to guide or monitor treatment for MRSA infections. Performed at [facility], [address], [place], [place] [address]          Radiology Studies: Mr Brain Wo Contrast  Result Date: 09/15/2018 CLINICAL DATA:  Hallucinations.  Slurred speech. EXAM: MRI HEAD WITHOUT CONTRAST TECHNIQUE: Multiplanar, multiecho pulse sequences of the brain and surrounding structures were obtained without intravenous contrast. COMPARISON:  CTA and CTP 09/15/2018.  MR head 07/27/2018. FINDINGS: The patient was unable to remain motionless for the exam. Small or subtle lesions could be overlooked. Brain: No evidence for acute infarction, hemorrhage, mass lesion, hydrocephalus, or extra-axial fluid. Cerebral and cerebellar atrophy, premature for age. Mild subcortical and periventricular T2 and FLAIR hyperintensities, likely chronic microvascular ischemic change. Vascular: Flow voids are maintained. Skull and upper cervical spine: Normal marrow signal. Sinuses/Orbits: Chronic paranasal sinus disease. No layering fluid. Negative orbits. Other: None. IMPRESSION: Atrophy and small vessel disease.  No acute intracranial findings. Electronically Signed   By: Staci Righter M.D.   On: 09/15/2018 15:40   US Carotid Bilateral (at [facility] Only)  Result Date: 09/15/2018 CLINICAL DATA:  Altered mental status, hypertension,  hyperlipidemia EXAM: BILATERAL CAROTID DUPLEX ULTRASOUND TECHNIQUE: Pearline Cables scale imaging, color Doppler and duplex ultrasound were performed of bilateral carotid and vertebral arteries in the neck. COMPARISON:  None. FINDINGS: Criteria: Quantification of carotid stenosis is based on velocity parameters that correlate the residual internal carotid diameter with NASCET-based stenosis levels, using the diameter of the distal internal carotid lumen as the denominator for stenosis measurement. The following velocity measurements were obtained: RIGHT ICA: 93/23 cm/sec CCA: 569/79 cm/sec SYSTOLIC ICA/CCA RATIO:  0.8 ECA: 99  cm/sec LEFT ICA: 117/32 cm/sec CCA: 680/88 cm/sec SYSTOLIC ICA/CCA RATIO:  1.0 ECA: 107 cm/sec RIGHT CAROTID ARTERY: No significant atherosclerotic plaque formation. No hemodynamically significant right ICA stenosis, velocity elevation, or turbulent flow. Degree of narrowing less than 50%. RIGHT VERTEBRAL ARTERY:  Antegrade LEFT CAROTID ARTERY: minor echogenic plaque formation. No hemodynamically significant left ICA stenosis, velocity elevation, or turbulent flow. LEFT VERTEBRAL ARTERY:  Antegrade IMPRESSION: Minor carotid atherosclerosis. No hemodynamically significant ICA stenosis. Degree of narrowing less than 50% bilaterally by ultrasound criteria. Patent antegrade vertebral flow bilaterally Electronically Signed   By: Jerilynn Mages.  Shick M.D.   On: 09/15/2018 16:16        Scheduled Meds: . amLODipine  5 mg Oral Daily  . aspirin EC  81 mg Oral Daily  . aspirin  300 mg Rectal Daily   Or  . aspirin  325 mg Oral Daily  . atorvastatin  40 mg Oral QHS  . carvedilol  6.25 mg Oral BID WC  . chlorhexidine  15 mL Mouth Rinse BID  . docusate sodium  100 mg Oral Daily  . gabapentin  300 mg Oral BID  . haloperidol  0.5 mg Oral BID  . heparin  5,000 Units Subcutaneous Q8H  . insulin aspart  0-5 Units Subcutaneous QHS  . insulin aspart  0-9 Units Subcutaneous TID WC  . insulin glargine  20 Units  Subcutaneous QHS  . levETIRAcetam  500 mg Oral BID  . levofloxacin  250 mg Oral Daily  . [START ON 09/18/2018] levothyroxine  25 mcg Oral Q0600  . levothyroxine  25 mcg Oral Q0600  . mouth rinse  15 mL Mouth Rinse q12n4p  . montelukast  10 mg Oral QHS  . pyridOXINE  200 mg Oral BID  . risperiDONE  0.5 mg Oral BID  . spironolactone  25 mg Oral Daily   Continuous Infusions:   LOS: 0 days    Time spent: 30 minutes    Nyx Keady Darleen Crocker, DO [facility] Pager [phone]  If 7PM-7AM, please contact night-coverage [email or web address] Password [email or web address] 09/17/2018, 10:26 AM

## 2018-09-18 ENCOUNTER — Inpatient Hospital Stay (HOSPITAL_COMMUNITY): Payer: Medicare Other

## 2018-09-18 DIAGNOSIS — Z7952 Long term (current) use of systemic steroids: Secondary | ICD-10-CM | POA: Diagnosis not present

## 2018-09-18 DIAGNOSIS — E1122 Type 2 diabetes mellitus with diabetic chronic kidney disease: Secondary | ICD-10-CM | POA: Diagnosis not present

## 2018-09-18 DIAGNOSIS — R4701 Aphasia: Secondary | ICD-10-CM | POA: Diagnosis not present

## 2018-09-18 DIAGNOSIS — R45851 Suicidal ideations: Secondary | ICD-10-CM | POA: Diagnosis not present

## 2018-09-18 DIAGNOSIS — Z7984 Long term (current) use of oral hypoglycemic drugs: Secondary | ICD-10-CM | POA: Diagnosis not present

## 2018-09-18 DIAGNOSIS — G9341 Metabolic encephalopathy: Secondary | ICD-10-CM | POA: Diagnosis not present

## 2018-09-18 DIAGNOSIS — E039 Hypothyroidism, unspecified: Secondary | ICD-10-CM | POA: Diagnosis not present

## 2018-09-18 DIAGNOSIS — I130 Hypertensive heart and chronic kidney disease with heart failure and stage 1 through stage 4 chronic kidney disease, or unspecified chronic kidney disease: Secondary | ICD-10-CM | POA: Diagnosis not present

## 2018-09-18 DIAGNOSIS — Z87891 Personal history of nicotine dependence: Secondary | ICD-10-CM | POA: Diagnosis not present

## 2018-09-18 DIAGNOSIS — N390 Urinary tract infection, site not specified: Secondary | ICD-10-CM | POA: Diagnosis not present

## 2018-09-18 DIAGNOSIS — G9340 Encephalopathy, unspecified: Secondary | ICD-10-CM | POA: Diagnosis not present

## 2018-09-18 DIAGNOSIS — Z79890 Hormone replacement therapy (postmenopausal): Secondary | ICD-10-CM | POA: Diagnosis not present

## 2018-09-18 DIAGNOSIS — Z7982 Long term (current) use of aspirin: Secondary | ICD-10-CM | POA: Diagnosis not present

## 2018-09-18 DIAGNOSIS — T1491XA Suicide attempt, initial encounter: Secondary | ICD-10-CM | POA: Diagnosis present

## 2018-09-18 DIAGNOSIS — Z0389 Encounter for observation for other suspected diseases and conditions ruled out: Secondary | ICD-10-CM | POA: Diagnosis not present

## 2018-09-18 DIAGNOSIS — E785 Hyperlipidemia, unspecified: Secondary | ICD-10-CM | POA: Diagnosis not present

## 2018-09-18 DIAGNOSIS — I5032 Chronic diastolic (congestive) heart failure: Secondary | ICD-10-CM | POA: Diagnosis not present

## 2018-09-18 DIAGNOSIS — B952 Enterococcus as the cause of diseases classified elsewhere: Secondary | ICD-10-CM | POA: Diagnosis not present

## 2018-09-18 DIAGNOSIS — Z79899 Other long term (current) drug therapy: Secondary | ICD-10-CM | POA: Diagnosis not present

## 2018-09-18 DIAGNOSIS — E1142 Type 2 diabetes mellitus with diabetic polyneuropathy: Secondary | ICD-10-CM | POA: Diagnosis not present

## 2018-09-18 DIAGNOSIS — N184 Chronic kidney disease, stage 4 (severe): Secondary | ICD-10-CM | POA: Diagnosis not present

## 2018-09-18 LAB — CBC
HCT: 24.5 % — ABNORMAL LOW (ref 36.0–46.0)
Hemoglobin: 7.9 g/dL — ABNORMAL LOW (ref 12.0–15.0)
MCH: 31.6 pg (ref 26.0–34.0)
MCHC: 32.2 g/dL (ref 30.0–36.0)
MCV: 98 fL (ref 80.0–100.0)
Platelets: 152 10*3/uL (ref 150–400)
RBC: 2.5 MIL/uL — ABNORMAL LOW (ref 3.87–5.11)
RDW: 13.2 % (ref 11.5–15.5)
WBC: 6.1 10*3/uL (ref 4.0–10.5)
nRBC: 0 % (ref 0.0–0.2)

## 2018-09-18 LAB — BASIC METABOLIC PANEL
Anion gap: 6 (ref 5–15)
BUN: 33 mg/dL — ABNORMAL HIGH (ref 8–23)
CO2: 23 mmol/L (ref 22–32)
Calcium: 8.9 mg/dL (ref 8.9–10.3)
Chloride: 112 mmol/L — ABNORMAL HIGH (ref 98–111)
Creatinine, Ser: 2.14 mg/dL — ABNORMAL HIGH (ref 0.44–1.00)
GFR calc Af Amer: 27 mL/min — ABNORMAL LOW (ref >60)
GFR calc non Af Amer: 23 mL/min — ABNORMAL LOW (ref >60)
Glucose, Bld: 188 mg/dL — ABNORMAL HIGH (ref 70–99)
Potassium: 4.2 mmol/L (ref 3.5–5.1)
Sodium: 141 mmol/L (ref 135–145)

## 2018-09-18 LAB — GLUCOSE, CAPILLARY
Glucose-Capillary: 162 mg/dL — ABNORMAL HIGH (ref 70–99)
Glucose-Capillary: 148 mg/dL — ABNORMAL HIGH (ref 70–99)
Glucose-Capillary: 192 mg/dL — ABNORMAL HIGH (ref 70–99)
Glucose-Capillary: 216 mg/dL — ABNORMAL HIGH (ref 70–99)

## 2018-09-18 MED ORDER — ONDANSETRON HCL 4 MG/2ML IJ SOLN
4.0000 mg | Freq: Once | INTRAMUSCULAR | Status: AC
  Administered 2018-09-18: 4 mg via INTRAVENOUS
  Filled 2018-09-18: qty 2

## 2018-09-18 MED ORDER — ONDANSETRON HCL 4 MG/2ML IJ SOLN
4.0000 mg | Freq: Four times a day (QID) | INTRAMUSCULAR | Status: DC | PRN
  Administered 2018-09-18 – 2018-09-19 (×2): 4 mg via INTRAVENOUS
  Filled 2018-09-18 (×2): qty 2

## 2018-09-18 NOTE — Progress Notes (Signed)
OT Screen Note  Patient Details Name: [name] MRN: [id number] DOB: 1950-09-29   Cancelled Treatment:    Reason Eval/Treat Not Completed: Other (comment). Patient evaluated by PT on Friday with recommendations to return to [facility]F to receive therapy services. Patient is planned to discharge to [facility] for treatment prior to returning to [facility]. Patient receives assistance from staff at [facility] for functional transfers and basic ADL tasks. Pt is primarily wheelchair bound and only completes functional transfers. Recommend staff OT evaluate need for follow OT services once she returns to [facility] as she appears to be at baseline for functional tasks. Thank-you for the referral.    Ailene Ravel, OTR/L,CBIS  [phone]  09/18/2018, 8:47 AM

## 2018-09-18 NOTE — NC FL2 (Signed)
[place] LEVEL OF CARE SCREENING TOOL     IDENTIFICATION  Patient Name: [name] Birthdate: [birth year] Sex: female Admission Date (Current Location): 09/12/2018  [facility] and [place] Number:  [employer] and Address:  [facility] [address], [place]      Provider Number: [id number]  Attending Physician Name and Address:  Rodena Goldmann, DO  Relative Name and Phone Number:       Current Level of Care: Hospital Recommended Level of Care: [place] Prior Approval Number:    Date Approved/Denied:   PASRR Number:    Discharge Plan: SNF    Current Diagnoses: Patient Active Problem List   Diagnosis Date Noted  . Pressure injury of skin 09/17/2018  . Respiratory failure ([place]) 08/16/2018  . Seizure ([place]) 08/16/2018  . Acute encephalopathy 07/27/2018  . Endotracheally intubated 07/27/2018  . Hypothyroidism 07/27/2018  . Pleuritic chest pain 07/14/2018  . Moderate persistent asthma without complication   . Type 2 diabetes mellitus ([place]) 04/19/2018  . Acute kidney injury superimposed on chronic kidney disease ([place]) 01/13/2018  . Altered mental status   . Encephalopathy 10/11/2016  . Chest pain 08/12/2016  . CKD (chronic kidney disease), stage IV ([place]) 08/12/2016  . History of pulmonary embolus (PE) 08/12/2016  . Obesity, Class III, BMI 40-49.9 (morbid obesity) ([place]) 08/12/2016  . RBBB 08/12/2016  . Positive D dimer 08/12/2016  . Cerebral infarction ([place]) 11/10/2015  . Physical deconditioning 07/01/2011  . Weakness 07/01/2011  . Diabetic foot ulcer ([place]) 05/19/2011  . Polypharmacy 02/09/2011  . BACK PAIN WITH RADICULOPATHY 05/25/2010  . Type 2 diabetes mellitus with diabetic polyneuropathy, without long-term current use of insulin ([place]) 08/18/2009  . Chronic diastolic heart failure ([place]) 04/25/2009  . Normocytic anemia 04/14/2009  . Sleep apnea 12/11/2008  . Diabetic peripheral neuropathy ([place])  05/04/2007  . HYPERCHOLESTEROLEMIA 10/13/2006  . Gout, unspecified 10/13/2006  . HYPOKALEMIA 10/13/2006  . Schizophrenia ([place]) 10/13/2006  . Depression 10/13/2006  . Essential hypertension 10/13/2006  . Venous (peripheral) insufficiency 10/13/2006  . RHINITIS, ALLERGIC 10/13/2006  . Asthma 10/13/2006  . Reflux esophagitis 10/13/2006  . OSTEOARTHRITIS, MULTI SITES 10/13/2006  . INCONTINENCE, URGE 10/13/2006    Orientation RESPIRATION BLADDER Height & Weight     Self, Place, Situation  Normal Continent Weight: 96.5 kg Height:  5\' 3"  (160 cm)  BEHAVIORAL SYMPTOMS/MOOD NEUROLOGICAL BOWEL NUTRITION STATUS  Dangerous to self, others or property(Recent past suicide attempt due to UTI) (none) Continent (Heart Healthy, carb modified)  AMBULATORY STATUS COMMUNICATION OF NEEDS Skin   Extensive Assist Verbally Other (Comment)(R Foot Daibetic Ulcer crusted over; R Heel, full thickness tissue loss with ulcer)                       Personal Care Assistance Level of Assistance  Bathing, Feeding, Dressing Bathing Assistance: Limited assistance Feeding assistance: Limited assistance Dressing Assistance: Limited assistance     Functional Limitations Info  Sight, Hearing, Speech Sight Info: Adequate Hearing Info: Adequate Speech Info: Adequate    SPECIAL CARE FACTORS FREQUENCY  PT (By licensed PT)     PT Frequency: 5X/W              Contractures Contractures Info: Not present    Additional Factors Info  Code Status, Allergies, Psychotropic Code Status Info: full Allergies Info: Benztropine, Codeine, Diazepam, Diphenhydramine Hcl, Sulfonamide Derivatives, Benztropine Mesylate, Thioridazine Hcl Psychotropic Info: Haldol, Risperdal  Current Medications (09/18/2018):  This is the current hospital active medication list Current Facility-Administered Medications  Medication Dose Route Frequency Provider Last Rate Last Dose  . acetaminophen (TYLENOL) tablet 650 mg  650  mg Oral Q4H PRN Manuella Ghazi, Pratik D, DO       Or  . acetaminophen (TYLENOL) solution 650 mg  650 mg Per Tube Q4H PRN Manuella Ghazi, Pratik D, DO       Or  . acetaminophen (TYLENOL) suppository 650 mg  650 mg Rectal Q4H PRN Manuella Ghazi, Pratik D, DO      . albuterol (PROVENTIL) (2.5 MG/3ML) 0.083% nebulizer solution 2.5 mg  2.5 mg Nebulization Q6H PRN Manuella Ghazi, Pratik D, DO      . amLODipine (NORVASC) tablet 5 mg  5 mg Oral Daily Manuella Ghazi, Pratik D, DO   5 mg at 09/18/18 0813  . aspirin EC tablet 81 mg  81 mg Oral Daily Heath Lark D, DO   81 mg at 09/18/18 0813  . atorvastatin (LIPITOR) tablet 40 mg  40 mg Oral QHS Shah, Pratik D, DO   40 mg at 09/17/18 2218  . carvedilol (COREG) tablet 6.25 mg  6.25 mg Oral BID WC Manuella Ghazi, Pratik D, DO   6.25 mg at 09/18/18 0347  . chlorhexidine (PERIDEX) 0.12 % solution 15 mL  15 mL Mouth Rinse BID Manuella Ghazi, Pratik D, DO   15 mL at 09/18/18 4259  . docusate sodium (COLACE) capsule 100 mg  100 mg Oral Daily Manuella Ghazi, Pratik D, DO   100 mg at 09/18/18 5638  . fentaNYL (SUBLIMAZE) injection 50 mcg  50 mcg Intravenous Q1H PRN Reubin Milan, MD      . gabapentin (NEURONTIN) capsule 300 mg  300 mg Oral BID Heath Lark D, DO   300 mg at 09/18/18 7564  . haloperidol (HALDOL) tablet 0.5 mg  0.5 mg Oral BID Heath Lark D, DO   0.5 mg at 09/18/18 3329  . heparin injection 5,000 Units  5,000 Units Subcutaneous Q8H Shah, Pratik D, DO   5,000 Units at 09/18/18 5188  . hydrALAZINE (APRESOLINE) injection 10 mg  10 mg Intravenous Q4H PRN Manuella Ghazi, Pratik D, DO      . insulin aspart (novoLOG) injection 0-5 Units  0-5 Units Subcutaneous QHS Manuella Ghazi, Pratik D, DO   2 Units at 09/17/18 2314  . insulin aspart (novoLOG) injection 0-9 Units  0-9 Units Subcutaneous TID WC Shah, Pratik D, DO   2 Units at 09/18/18 1210  . insulin glargine (LANTUS) injection 20 Units  20 Units Subcutaneous QHS Heath Lark D, DO   20 Units at 09/17/18 2314  . levETIRAcetam (KEPPRA) tablet 500 mg  500 mg Oral BID Manuella Ghazi, Pratik D, DO   500 mg at  09/18/18 4166  . levothyroxine (SYNTHROID, LEVOTHROID) tablet 25 mcg  25 mcg Oral Q0600 Heath Lark D, DO   25 mcg at 09/18/18 0630  . MEDLINE mouth rinse  15 mL Mouth Rinse q12n4p Manuella Ghazi, Pratik D, DO   15 mL at 09/18/18 1210  . montelukast (SINGULAIR) tablet 10 mg  10 mg Oral QHS Shah, Pratik D, DO   10 mg at 09/17/18 2218  . nitroGLYCERIN (NITROSTAT) SL tablet 0.4 mg  0.4 mg Sublingual Q5 min PRN Reubin Milan, MD      . pyridOXINE (VITAMIN B-6) tablet 200 mg  200 mg Oral BID Manuella Ghazi, Pratik D, DO   200 mg at 09/18/18 0935  . risperiDONE (RISPERDAL) tablet 0.5 mg  0.5 mg Oral BID Manuella Ghazi,  Pratik D, DO   0.5 mg at 09/18/18 0813  . spironolactone (ALDACTONE) tablet 25 mg  25 mg Oral Daily Manuella Ghazi, Pratik D, DO   25 mg at 09/18/18 1388  . traMADol (ULTRAM) tablet 50 mg  50 mg Oral Q12H PRN Vertis Kelch, NP   50 mg at 09/16/18 2130     Discharge Medications: Please see discharge summary for a list of discharge medications.  Relevant Imaging Results:  Relevant Lab Results:   Additional [place], LCSW

## 2018-09-18 NOTE — Progress Notes (Signed)
SLP Cancellation Note  Patient Details Name: [name] MRN: [id number] DOB: 1951/04/28   Cancelled treatment:        Attempted to complete BSE and upon SLP entering the room Pt had just finished her lunch tray (salad) and reported "It's about to come back up". Pt immediately demonstrated emesis of a large quantity; note emesis containing large pieces of food that appeared to not be masticated. SLP will re-attempt BSE later today as schedule permits. Thank you,  Livvy Spilman H. Roddie Mc, CCC-SLP Speech Language Pathologist    Wende Bushy 09/18/2018, 12:50 PM

## 2018-09-18 NOTE — Progress Notes (Signed)
PROGRESS NOTE    [name]  DGL:[id number] DOB: 12/03/1950 DOA: 09/12/2018 PCP: Glenda Chroman, MD   Brief Narrative:  Per HPI: Kathleen Archeris a 68 y.o.femalewith medical history significant forschizoaffective disorder, CKD stage IV, chronic anemia, hypertension, dyslipidemia, type 2 diabetes, hypothyroidism, obesity, prior CVA, and questionable prior seizure activity who was brought to the ED on 1/29 from her assisted living facility where she was found by the nurse with a plastic bag around her head. It appears that she had this bag around her head and an attempt to harm herself and stated to the nurse at that time that she was seeing demons and monsters that did not leave her alone. She continued to have hallucinations with potential suicidal ideation for which she was initially given some Haldol and was assessed by psychiatry. She awaited bed placement to inpatient psychiatry in the ED and was started on Risperdal 0.5 mg twice a day for brief psychotic disorder. She had remained stable in the ED until this morning at which point she became much less responsive and had some slurred speech for which she underwent stroke evaluation. She underwent multiple imaging studies with CTA as well as perfusion study with no acute findings noted. Tele-neurology was consulted and recommended further evaluation with EEG and brain MRI.  Patient has been admitted with acute encephalopathy-suspect metabolic with UTI secondary to enterococcus. She has been started on IV Levaquin, now transitioned to PO, and has been revisited by TTS today and noted to be stable for discharge on twice daily Risperdal.  Assessment & Plan:   Principal Problem:   Acute encephalopathy Active Problems:   Normocytic anemia   Schizophrenia ([place])   Essential hypertension   Chronic diastolic heart failure (HCC)   Cerebral infarction ([place])   CKD (chronic kidney disease), stage IV (HCC)   Type 2 diabetes mellitus  ([place])   Hypothyroidism   Pressure injury of skin  1. Acutemetabolicencephalopathy in the setting of brief psychotic disorder with hallucinations and suicidal ideation-resolved. EEG and MRI with no acute findings. Patient has been transferred to medical floor at this time.  2D echocardiogram with LVEF 60 to 65% and no acute findings as well as carotid ultrasounds with less than 50% stenosis noted bilaterally.  LDL of 78 and therefore, will continue statin.  Hemoglobin A1c of 7.4%.  TSH in normal range of 3.26. No further need for sitter at bedside at this time per TTS. She will be placed on Risperdal 1 mg at bedtime and 0.5 mg in the morning. Appears to have UTI which is a significant contributing factor.  2. Enterococcal UTI.     Treated with fluoroquinolones for now 6 days.  Appears to have resolved.  Will discontinue Levaquin today. 3. CKD stage IV-stable. Patient appears to be at baseline with usual creatinine between 2.1-2.3. Continue to monitor carefully. No further need for IV fluid as patient is tolerating diet. 4. Chronic anemia-stable. Appears to be at baseline with no overt bleeding identified. Anemia panel and repeat CBC within normal limits. 5. Hypothyroidism. Maintain on Synthroid andTSH checked and found to be within normal limits. 6. Chronic grade 1 diastolic heart failure. Does not appear to be in any heart failure at this time and will check 2D echocardiogram as noted above. Maintain on daily weights. Withhold any diuretics at this time until patient is able to tolerate oral intake. 7. Type 2 diabetes. Noted with some mild hyperglycemia at this time. Will check hemoglobin A1cwhich was 7.4%and maintain on SSI  coverage. 8. Prior CVA. Maintain on statin and full dose aspirin. 9. GERD. PPI daily.  DVT prophylaxis:Heparin Code Status:Full Family Communication:None at bedside Disposition Plan:Placement being worked on by social workers to [facility]. No CVA or  findings noted on EEG. Patient is completed treatment for UTI.   Consultants:  Neurology  TTS  Procedures:  EEG performed on 09/15/2018  Antimicrobials:  IV Levaquin 2/1->2/1  PO Levaquin 2/2->2/3   Subjective: Patient seen and evaluated today with no new acute complaints or concerns. No acute concerns or events noted overnight.  She is up this morning and having some breakfast.  Objective: Vitals:   09/17/18 0611 09/17/18 1309 09/17/18 2114 09/18/18 0520  BP: (!) 167/77 (!) 150/68 (!) 142/73 (!) 183/88  Pulse: 83 95 85 85  Resp: 16 (!) 21    Temp: 97.8 F (36.6 C) 98.1 F (36.7 C) 97.8 F (36.6 C) 98 F (36.7 C)  TempSrc: Oral Oral Oral Oral  SpO2: 96% 93% 100% 97%  Weight:      Height:        Intake/Output Summary (Last 24 hours) at 09/18/2018 1142 Last data filed at 09/18/2018 1142 Gross per 24 hour  Intake 1200 ml  Output 1900 ml  Net -700 ml   Filed Weights   09/14/18 0244 09/15/18 1154 09/16/18 0500  Weight: 96.5 kg 99.2 kg 96.5 kg    Examination:  General exam: Appears calm and comfortable  Respiratory system: Clear to auscultation. Respiratory effort normal. Cardiovascular system: S1 & S2 heard, RRR. No JVD, murmurs, rubs, gallops or clicks. No pedal edema. Gastrointestinal system: Abdomen is nondistended, soft and nontender. No organomegaly or masses felt. Normal bowel sounds heard. Central nervous system: Alert and oriented. No focal neurological deficits. Extremities: Symmetric 5 x 5 power. Skin: No rashes, lesions or ulcers Psychiatry: Judgement and insight appear normal. Mood & affect appropriate.     Data Reviewed: I have personally reviewed following labs and imaging studies  CBC: Recent Labs  Lab 09/13/18 0038 09/15/18 0854 09/17/18 0448 09/18/18 0506  WBC 6.4 6.2 6.0 6.1  NEUTROABS 4.5 4.1  --   --   HGB 8.6* 8.2* 7.6* 7.9*  HCT 25.9* 25.1* 23.4* 24.5*  MCV 95.9 96.9 97.5 98.0  PLT 160 152 155 950   Basic Metabolic  Panel: Recent Labs  Lab 09/13/18 0038 09/15/18 0854 09/17/18 0448 09/18/18 0506  NA 137 140 140 141  K 3.7 3.7 3.9 4.2  CL 107 110 112* 112*  CO2 24 23 21* 23  GLUCOSE 224* 163* 181* 188*  BUN 28* 36* 31* 33*  CREATININE 2.19* 2.28* 2.04* 2.14*  CALCIUM 8.6* 8.6* 8.7* 8.9   GFR: Estimated Creatinine Clearance: 28.6 mL/min (A) (by C-G formula based on SCr of 2.14 mg/dL (H)). Liver Function Tests: Recent Labs  Lab 09/13/18 0038 09/15/18 0854  AST 18 20  ALT 23 23  ALKPHOS 76 74  BILITOT 0.4 0.3  PROT 6.2* 6.3*  ALBUMIN 3.0* 2.8*   No results for input(s): LIPASE, AMYLASE in the last 168 hours. Recent Labs  Lab 09/15/18 1204  AMMONIA 20   Coagulation Profile: Recent Labs  Lab 09/15/18 0854  INR 1.06   Cardiac Enzymes: Recent Labs  Lab 09/15/18 2001 09/16/18 0206 09/16/18 0830 09/16/18 1737  CKTOTAL 73  --   --   --   TROPONINI <0.03 <0.03 <0.03 <0.03   BNP (last 3 results) No results for input(s): PROBNP in the last 8760 hours. HbA1C: Recent Labs  09/16/18 0206  HGBA1C 7.4*   CBG: Recent Labs  Lab 09/17/18 1600 09/17/18 2311 09/18/18 0601 09/18/18 0737 09/18/18 1115  GLUCAP 234* 212* 162* 148* 192*   Lipid Profile: Recent Labs    09/16/18 0206  CHOL 160  HDL 31*  LDLCALC 78  TRIG 255*  CHOLHDL 5.2   Thyroid Function Tests: Recent Labs    09/15/18 1204  TSH 3.260   Anemia Panel: Recent Labs    09/17/18 1041  VITAMINB12 534  FOLATE 28.3  FERRITIN 23  TIBC 258  IRON 72  RETICCTPCT 2.9   Sepsis Labs: No results for input(s): PROCALCITON, LATICACIDVEN in the last 168 hours.  Recent Results (from the past 240 hour(s))  Urine culture     Status: Abnormal   Collection Time: 09/13/18  3:25 AM  Result Value Ref Range Status   Specimen Description   Final    URINE, CLEAN CATCH Performed at [facility], [address], [place], [place] [address]    Special Requests   Final    NONE Performed at [facility], [address], [place], [place] [address]    Culture >=100,000 COLONIES/mL ENTEROCOCCUS FAECALIS (A)  Final   Report Status 09/15/2018 FINAL  Final   Organism ID, Bacteria ENTEROCOCCUS FAECALIS (A)  Final      Susceptibility   Enterococcus faecalis - MIC*    AMPICILLIN <=2 SENSITIVE Sensitive     LEVOFLOXACIN 1 SENSITIVE Sensitive     NITROFURANTOIN <=16 SENSITIVE Sensitive     VANCOMYCIN 1 SENSITIVE Sensitive     * >=100,000 COLONIES/mL ENTEROCOCCUS FAECALIS  MRSA PCR Screening     Status: None   Collection Time: 09/15/18 11:57 AM  Result Value Ref Range Status   MRSA by PCR NEGATIVE NEGATIVE Final    Comment:        The GeneXpert MRSA Assay (FDA approved for NASAL specimens only), is one component of a comprehensive MRSA colonization surveillance program. It is not intended to diagnose MRSA infection nor to guide or monitor treatment for MRSA infections. Performed at [facility], [address] [place], [place] [address]          Radiology Studies: No results found.      Scheduled Meds: . amLODipine  5 mg Oral Daily  . aspirin EC  81 mg Oral Daily  . atorvastatin  40 mg Oral QHS  . carvedilol  6.25 mg Oral BID WC  . chlorhexidine  15 mL Mouth Rinse BID  . docusate sodium  100 mg Oral Daily  . gabapentin  300 mg Oral BID  . haloperidol  0.5 mg Oral BID  . heparin  5,000 Units Subcutaneous Q8H  . insulin aspart  0-5 Units Subcutaneous QHS  . insulin aspart  0-9 Units Subcutaneous TID WC  . insulin glargine  20 Units Subcutaneous QHS  . levETIRAcetam  500 mg Oral BID  . levothyroxine  25 mcg Oral Q0600  . mouth rinse  15 mL Mouth Rinse q12n4p  . montelukast  10 mg Oral QHS  . pyridOXINE  200 mg Oral BID  . risperiDONE  0.5 mg Oral BID  . spironolactone  25 mg Oral Daily   Continuous Infusions:   LOS: 0 days    Time spent: 30 minutes    Pratik Darleen Crocker, DO [facility] Pager [phone]  If 7PM-7AM, please contact  night-coverage [email or web address] Password [email or web address] 09/18/2018, 11:42 AM

## 2018-09-18 NOTE — Clinical Social Work Placement (Signed)
   CLINICAL SOCIAL WORK PLACEMENT  NOTE  Date:  09/18/2018  Patient Details  Name: [name] MRN: [id number] Date of Birth: January 17, 1951  Clinical Social Work is seeking post-discharge placement for this patient at the [place] level of care (*CSW will initial, date and re-position this form in  chart as items are completed):  Yes   Patient/family provided with [place] Work Department's list of facilities offering this level of care within the geographic area requested by the patient (or if unable, by the patient's family).  Yes   Patient/family informed of their freedom to choose among providers that offer the needed level of care, that participate in Medicare, Medicaid or managed care program needed by the patient, have an available bed and are willing to accept the patient.  Yes   Patient/family informed of [facility]'s ownership interest in [employer] and [facility], as well as of the fact that they are under no obligation to receive care at these facilities.  PASRR submitted to EDS on 09/18/18     PASRR number received on       Existing PASRR number confirmed on       FL2 transmitted to all facilities in geographic area requested by pt/family on 09/18/18     FL2 transmitted to all facilities within larger geographic area on       Patient informed that his/her managed care company has contracts with or will negotiate with certain facilities, including the following:            Patient/family informed of bed offers received.  Patient chooses bed at       Physician recommends and patient chooses bed at      Patient to be transferred to   on  .  Patient to be transferred to facility by       Patient family notified on   of transfer.  Name of family member notified:        PHYSICIAN       Additional Comment: Upon interview, pt presents as anxious about her future due to the inability to return to [facility].  She  expresses interest in SNF for rehab based on PT work up last Friday, and asked me to send out her info to facilities in [facility].  In the meantime, [name] from [facility] called and stated she had identified a [place] that is interested in pt.  They came to see her and stated they feel like she is a good fit, and they can handle her medical and mental health needs.  PT to see tomorrow to see if recommendation is the same or not.   _______________________________________________ Trish Mage, LCSW 09/18/2018, 4:34 PM

## 2018-09-18 NOTE — Progress Notes (Signed)
Disposition CSW called and spoke to [facility] CSW, Kristine Linea., LCSW, to advise that patient has been psych cleared but is in need of return to her ALF after she is cleared medically. Patient admitted to inpatient unit at [facility] on 09/17/18.  Areatha Keas. Judi Cong, MSW, [place] Disposition Clinical Social Work [phone] (cell) [phone] (office)

## 2018-09-19 DIAGNOSIS — G9341 Metabolic encephalopathy: Secondary | ICD-10-CM | POA: Diagnosis not present

## 2018-09-19 DIAGNOSIS — R4701 Aphasia: Secondary | ICD-10-CM | POA: Diagnosis not present

## 2018-09-19 DIAGNOSIS — R45851 Suicidal ideations: Secondary | ICD-10-CM | POA: Diagnosis not present

## 2018-09-19 DIAGNOSIS — N184 Chronic kidney disease, stage 4 (severe): Secondary | ICD-10-CM | POA: Diagnosis not present

## 2018-09-19 DIAGNOSIS — G9340 Encephalopathy, unspecified: Secondary | ICD-10-CM | POA: Diagnosis not present

## 2018-09-19 DIAGNOSIS — I130 Hypertensive heart and chronic kidney disease with heart failure and stage 1 through stage 4 chronic kidney disease, or unspecified chronic kidney disease: Secondary | ICD-10-CM | POA: Diagnosis not present

## 2018-09-19 DIAGNOSIS — N390 Urinary tract infection, site not specified: Secondary | ICD-10-CM | POA: Diagnosis not present

## 2018-09-19 DIAGNOSIS — T1491XA Suicide attempt, initial encounter: Secondary | ICD-10-CM | POA: Diagnosis not present

## 2018-09-19 LAB — GLUCOSE, CAPILLARY
Glucose-Capillary: 141 mg/dL — ABNORMAL HIGH (ref 70–99)
Glucose-Capillary: 172 mg/dL — ABNORMAL HIGH (ref 70–99)
Glucose-Capillary: 197 mg/dL — ABNORMAL HIGH (ref 70–99)
Glucose-Capillary: 201 mg/dL — ABNORMAL HIGH (ref 70–99)
Glucose-Capillary: 222 mg/dL — ABNORMAL HIGH (ref 70–99)

## 2018-09-19 IMAGING — DX DG SHOULDER 2+V*L*
2 series · 2 of 2 positions shown · non-contrast
Comparison: Chest x-ray 03/24/2018

CLINICAL DATA: Fall with shoulder pain

EXAM:
LEFT SHOULDER - 2+ VIEW

[shoulder grashey]
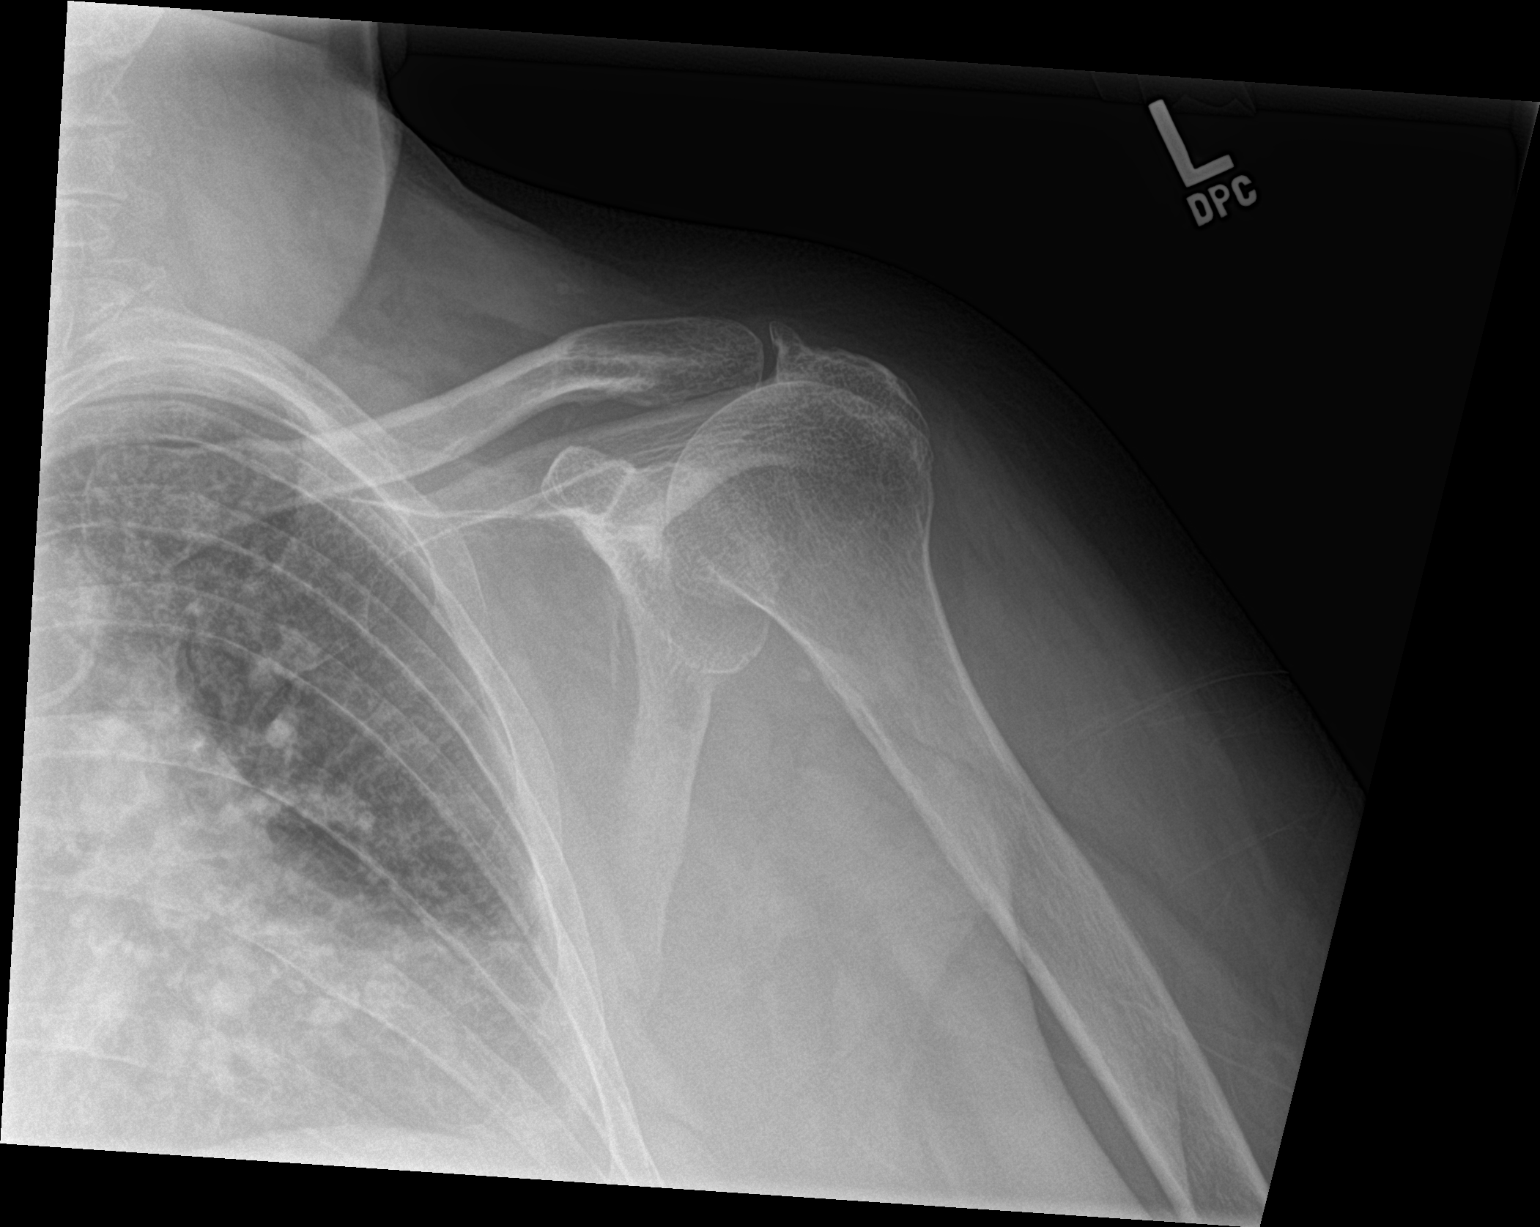

[shoulder y view]
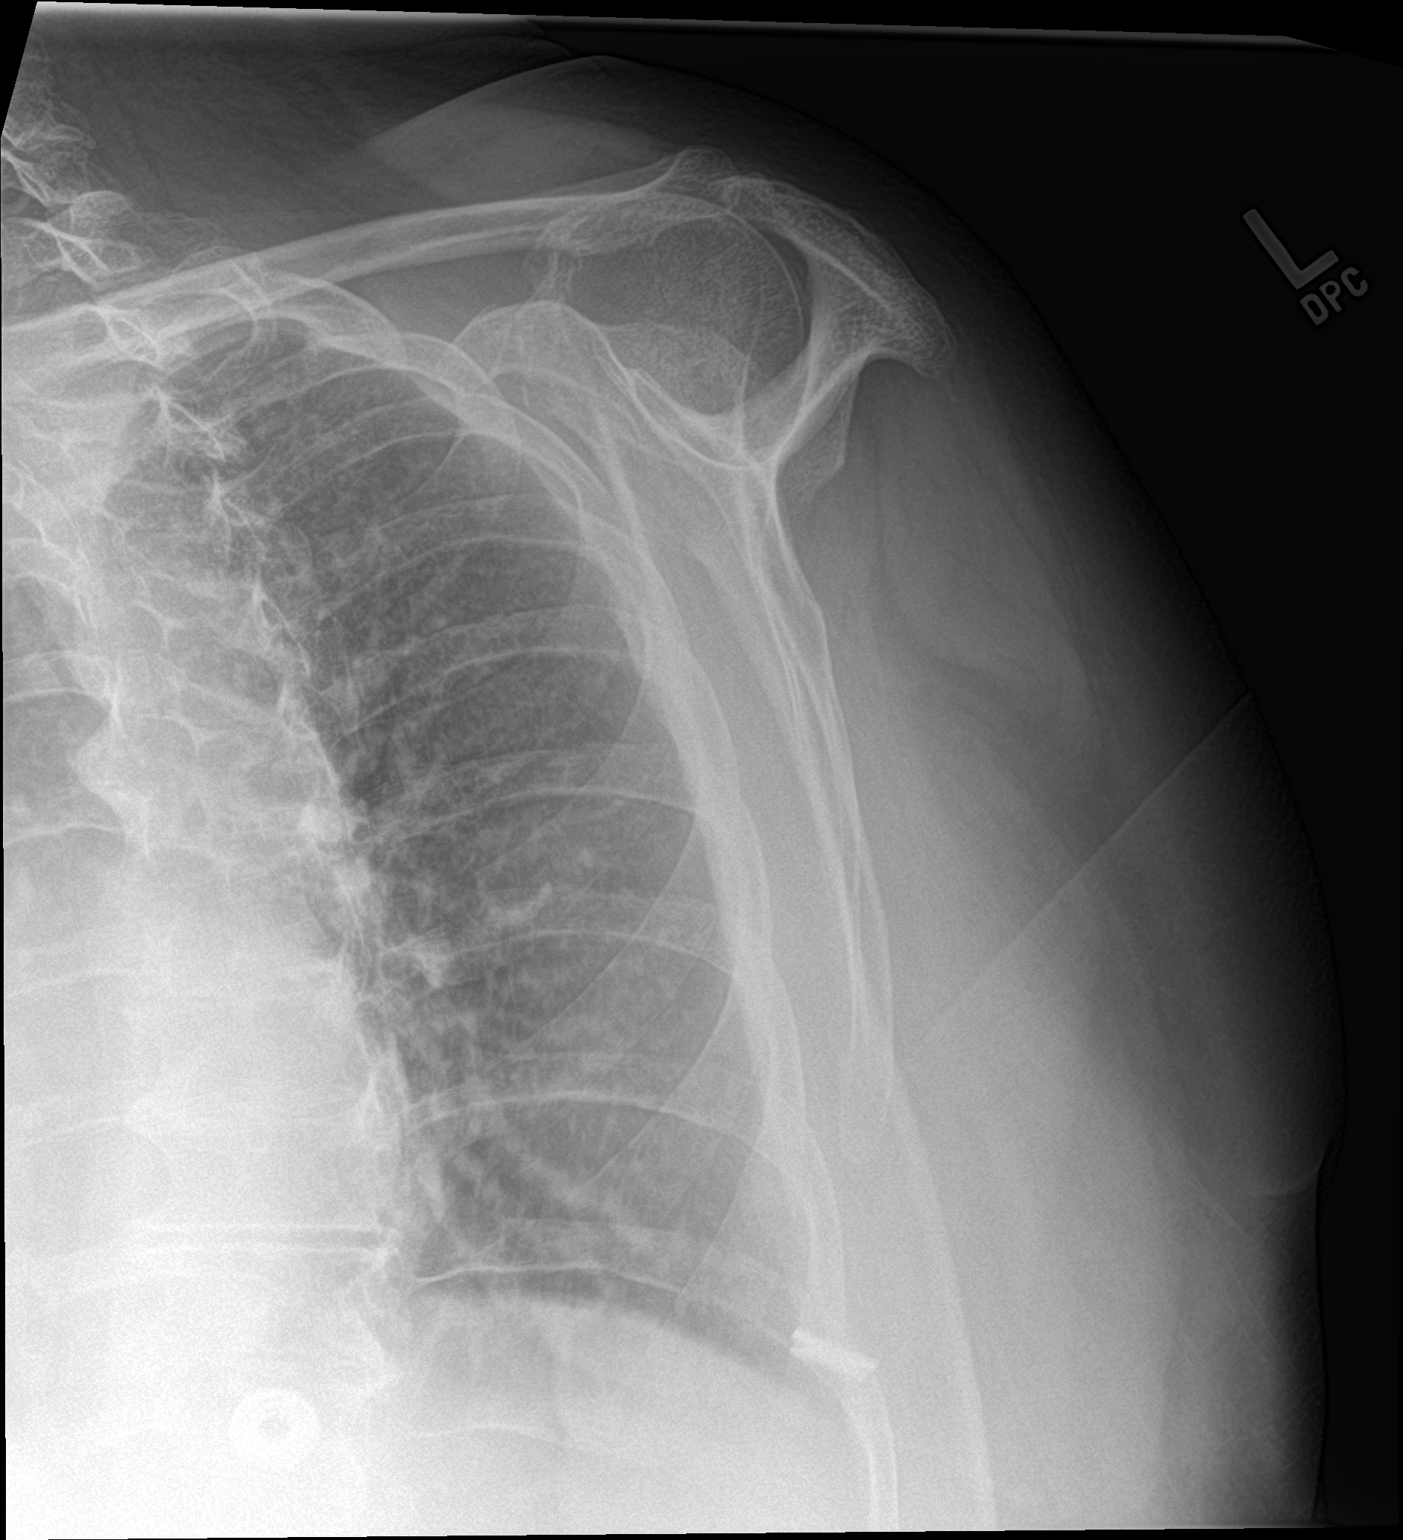

[2 of 2 positions shown; findings below may reference images not displayed]

FINDINGS: AC joint shows mild degenerative change. High-riding humeral head
consistent with rotator cuff disease. No acute fracture or
dislocation.
IMPRESSION: 1. No acute osseous abnormality.
2. High-riding humeral head consistent with rotator cuff disease

## 2018-09-19 IMAGING — DX DG FOREARM 2V*L*
2 series · 2 of 2 positions shown · non-contrast
Comparison: None.

CLINICAL DATA: Fall with forearm pain

EXAM:
LEFT FOREARM - 2 VIEW

[forearm ap]
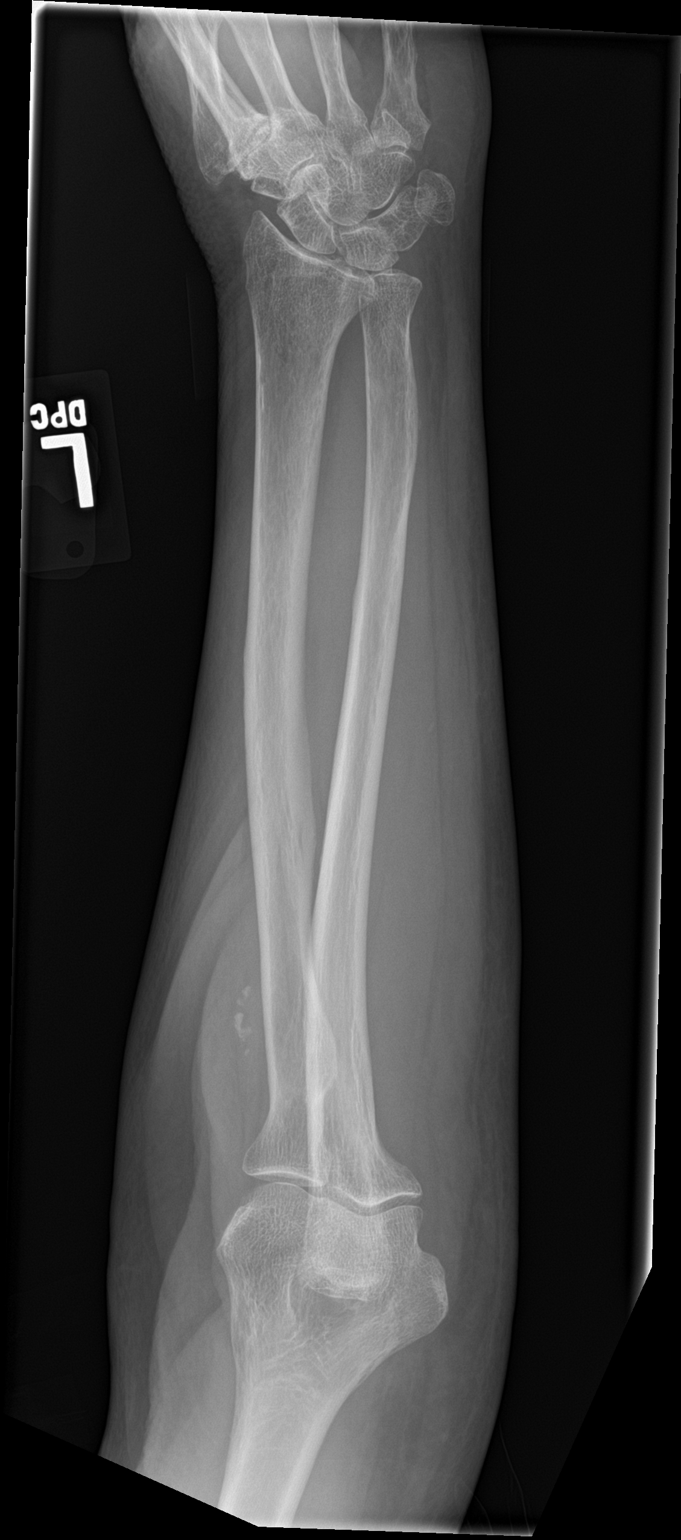

[forearm lat]
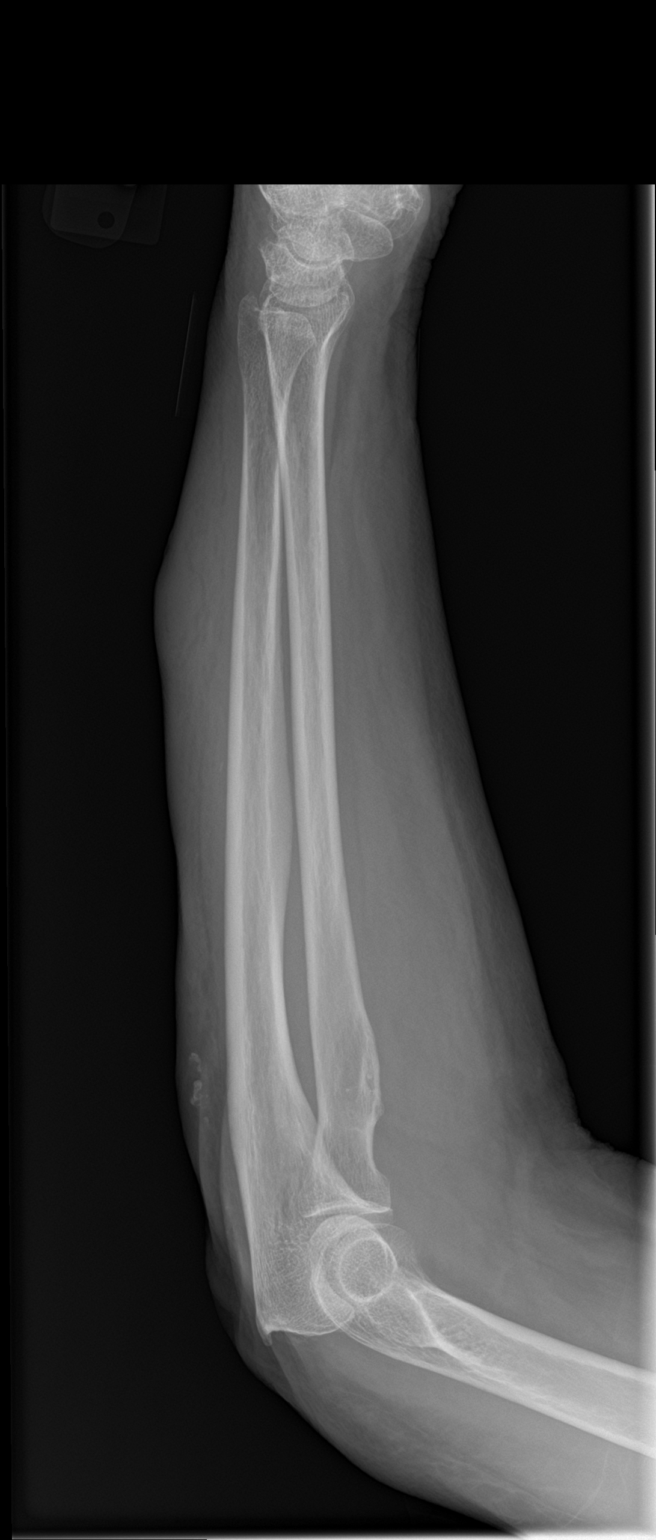

[2 of 2 positions shown; findings below may reference images not displayed]

FINDINGS: No fracture or malalignment. Soft tissue swelling dorsal aspect of
the distal forearm. No radiopaque foreign body
IMPRESSION: No acute osseous abnormality. Soft tissue swelling dorsal aspect of
the distal forearm

## 2018-09-19 IMAGING — CT CT CERVICAL SPINE W/O CM
4 of 7 series · 14 of 33 positions shown, 15 images · non-contrast
Comparison: CT brain 01/12/2018

CLINICAL DATA: Fell in bathroom hit back of head

EXAM:
CT HEAD WITHOUT CONTRAST
CT CERVICAL SPINE WITHOUT CONTRAST
TECHNIQUE: Multidetector CT imaging of the head and cervical spine was
performed following the standard protocol without intravenous
contrast. Multiplanar CT image reconstructions of the cervical spine
were also generated.

[Series 8: c spine soft · axial · 0.28mm/px · z∈[+1398,+1488]mm · 4 of 75 slices shown]
[im 15/75  soft-tissue]
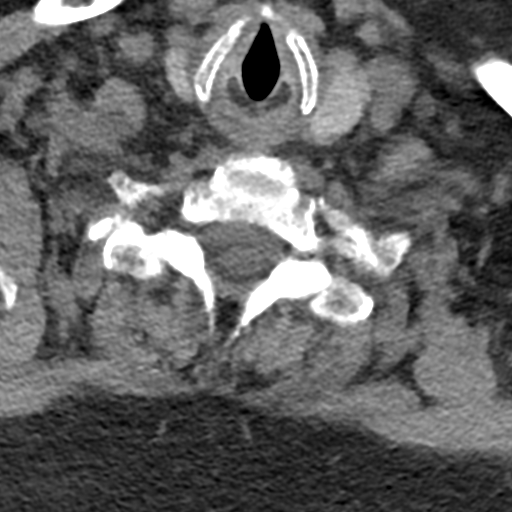
[im 30/75  soft-tissue]
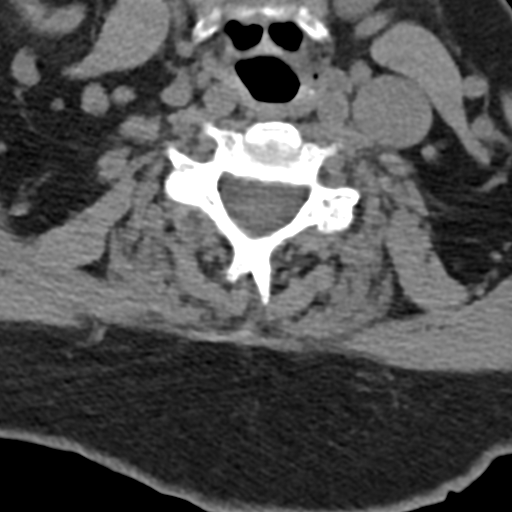
[im 45/75  soft-tissue]
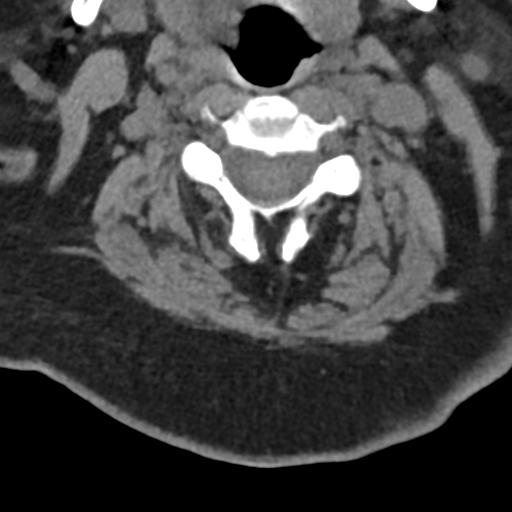
[im 60/75  soft-tissue]
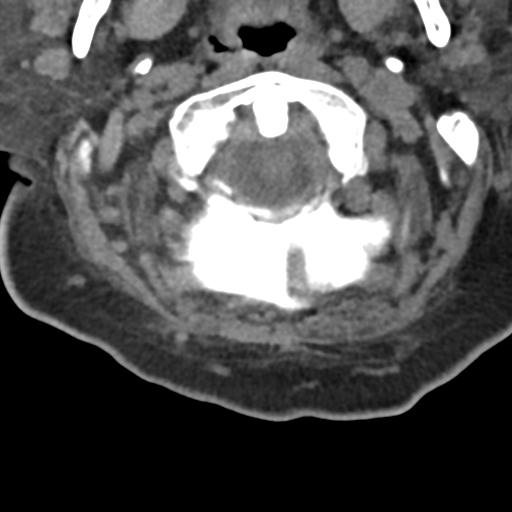

[Series 11: sagittal bone · sagittal · 0.23mm/px · 5 of 61 slices shown]
[im 11/61  bone]
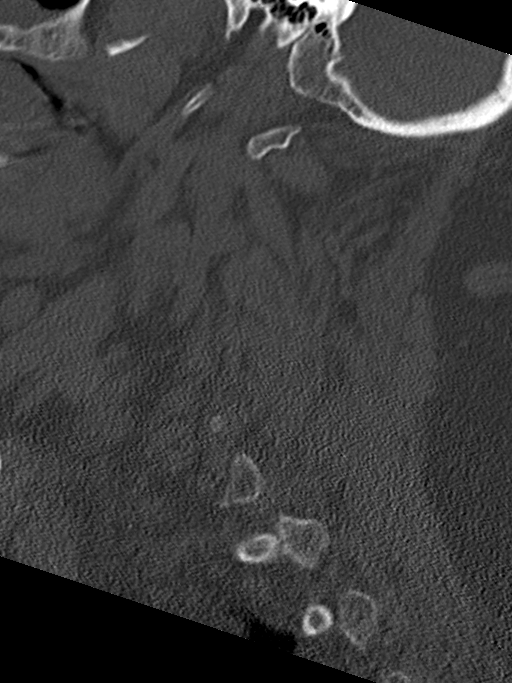
[im 21/61  bone]
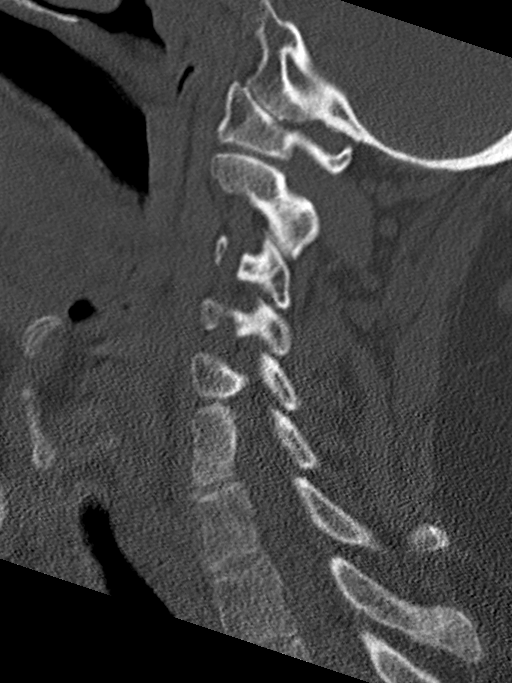
[im 31/61  bone]
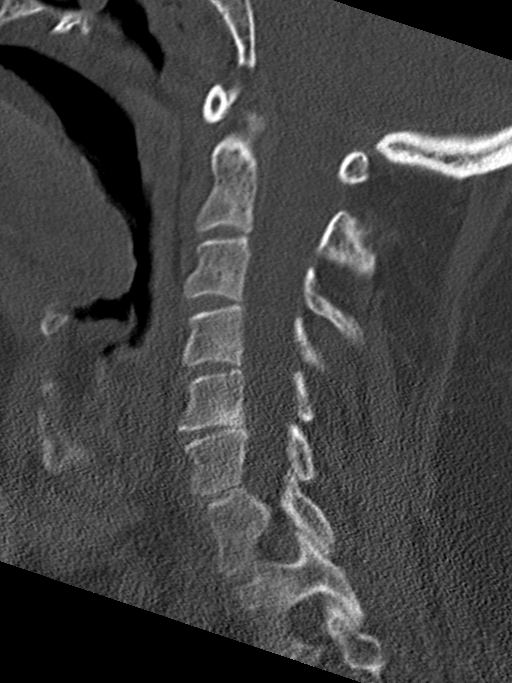
[im 41/61  bone]
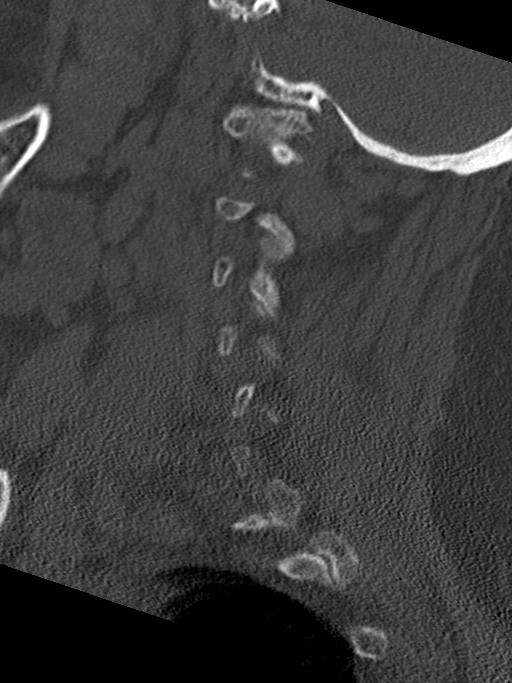
[im 51/61  bone]
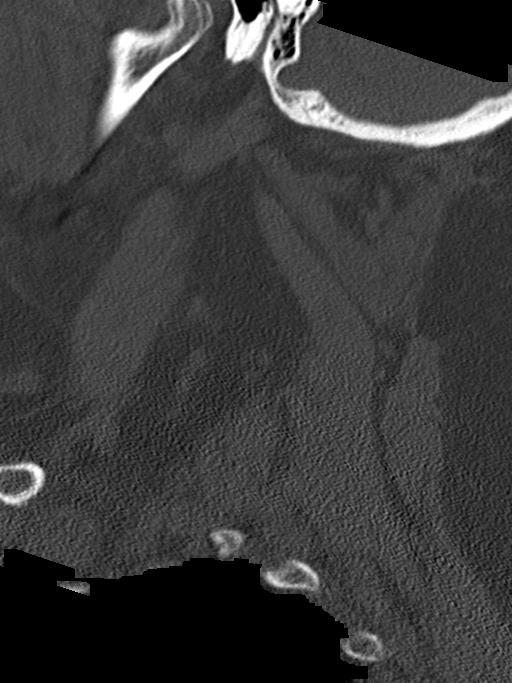

[Series 12: coronal bone · coronal · 0.23mm/px · 1 of 61 slices shown]
[im 31/61  bone]
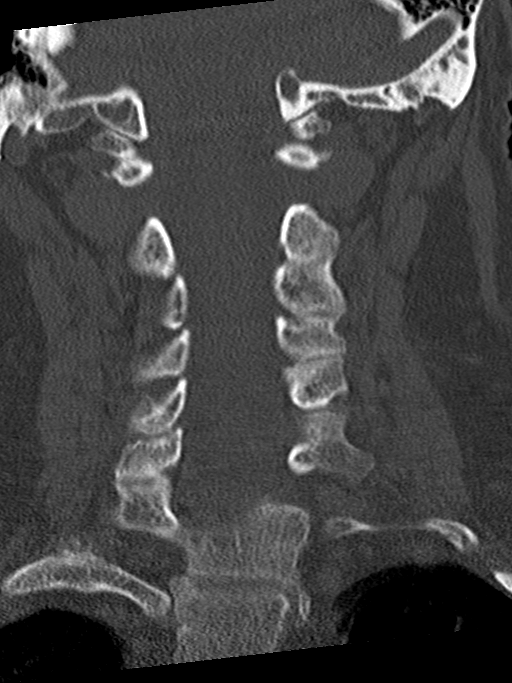

[Series 13: orthogonal bone · axial · 0.21mm/px · z∈[+1380,+1474]mm · 4 of 78 slices shown, 5 images]
[im 16/78  soft-tissue]
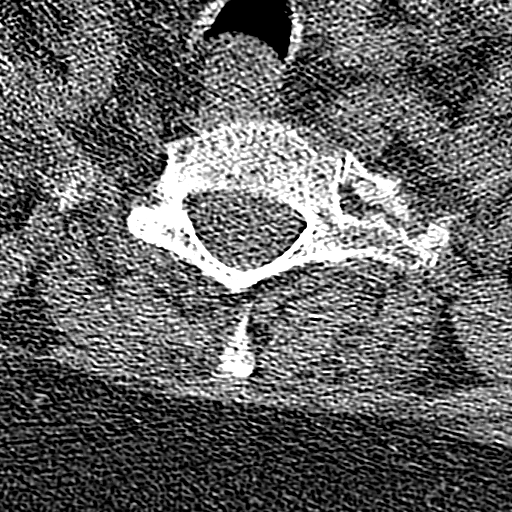
[im 16/78  bone]
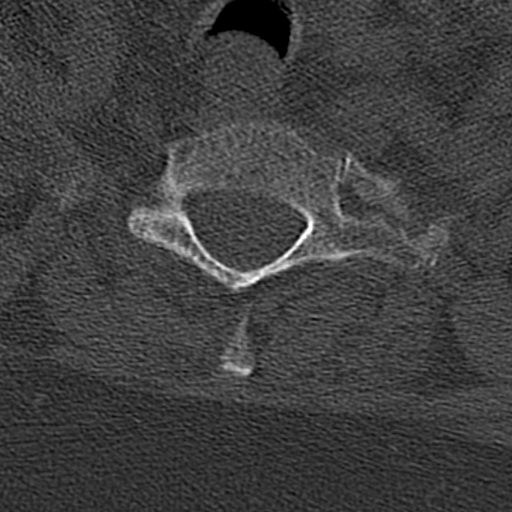
[im 31/78  bone]
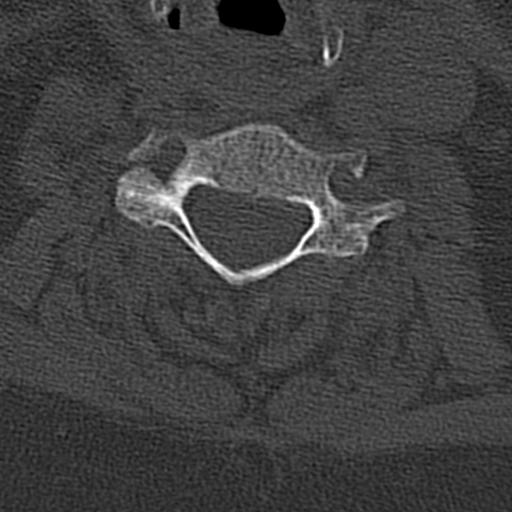
[im 47/78  bone]
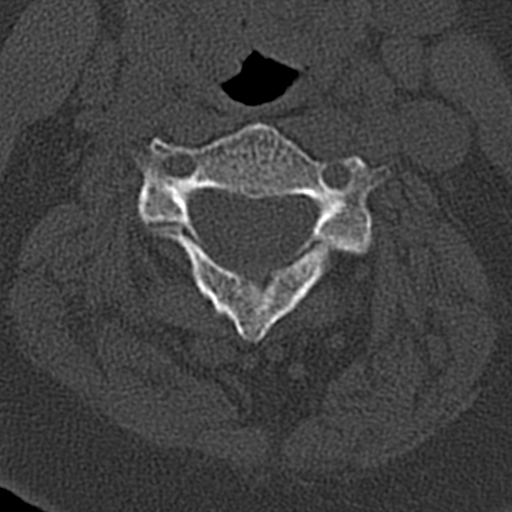
[im 62/78  bone]
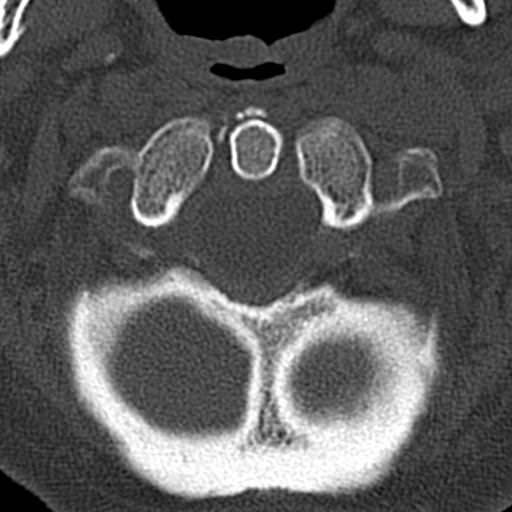

[14 of 33 positions shown; findings below may reference images not displayed]

FINDINGS: CT HEAD FINDINGS

Brain: No acute territorial infarction, hemorrhage or intracranial
mass. Mild atrophy. Mild small vessel ischemic changes of the white
matter. Chronic lacunar infarcts within the left greater than right
basal ganglia.

Vascular: No hyperdense vessels.  Carotid vascular calcification.

Skull: Normal. Negative for fracture or focal lesion.

Sinuses/Orbits: No acute finding.

Other: None

CT CERVICAL SPINE FINDINGS

Alignment: No subluxation.  Facet alignment within normal limits

Skull base and vertebrae: No acute fracture. No primary bone lesion
or focal pathologic process.

Soft tissues and spinal canal: No prevertebral fluid or swelling. No
visible canal hematoma.

Disc levels:  Mild degenerative changes C4-C5, C5-C6 and C6-C7.

Upper chest: Negative.

Other: None
IMPRESSION: 1. No CT evidence for acute intracranial abnormality. Atrophy and
mild small vessel ischemic changes of the white matter
2. No acute osseous abnormality of the cervical spine

## 2018-09-19 MED ORDER — RISPERIDONE 0.5 MG PO TABS: 0.5000 mg | ORAL_TABLET | Freq: Two times a day (BID) | ORAL | 0 refills | Status: DC

## 2018-09-19 MED ORDER — KETOROLAC TROMETHAMINE 15 MG/ML IJ SOLN
15.0000 mg | Freq: Once | INTRAMUSCULAR | Status: AC
  Administered 2018-09-19: 15 mg via INTRAVENOUS
  Filled 2018-09-19: qty 1

## 2018-09-19 NOTE — Discharge Summary (Signed)
Physician Discharge Summary  [name] WYO:[id number] DOB: [birth year] DOA: 09/12/2018  PCP: Glenda Chroman, MD  Admit date: 09/12/2018  Discharge date: 09/19/2018  Admitted From:ALF  Disposition:  ALF  Recommendations for Outpatient Follow-up:  1. Follow up with PCP in 1-2 weeks 2. Continue on Risperdal as prescribed with 0.5 mg in a.m. and 1 mg in p.m.  Home Health: Yes with PT  Equipment/Devices: Hospital bed  Discharge Condition: Stable  CODE STATUS: Full  Diet recommendation: Heart Healthy/carb modified  Brief/Interim Summary: Per HPI: Marjean Archeris a 68 y.o.femalewith medical history significant forschizoaffective disorder, CKD stage IV, chronic anemia, hypertension, dyslipidemia, type 2 diabetes, hypothyroidism, obesity, prior CVA, and questionable prior seizure activity who was brought to the ED on 1/29 from her assisted living facility where she was found by the nurse with a plastic bag around her head. It appears that she had this bag around her head and an attempt to harm herself and stated to the nurse at that time that she was seeing demons and monsters that did not leave her alone. She continued to have hallucinations with potential suicidal ideation for which she was initially given some Haldol and was assessed by psychiatry. She awaited bed placement to inpatient psychiatry in the ED and was started on Risperdal 0.5 mg twice a day for brief psychotic disorder. She had remained stable in the ED until this morning at which point she became much less responsive and had some slurred speech for which she underwent stroke evaluation. She underwent multiple imaging studies with CTA as well as perfusion study with no acute findings noted. Tele-neurology was consulted and recommended further evaluation with EEG and brain MRI.  Patient has been admitted with acute encephalopathy-suspect metabolic with UTI secondary to enterococcus.  She has been treated with  fluoroquinolones to include ciprofloxacin and Levaquin for period of 7 days and has completed course of treatment.  She is overall doing much better and has been reevaluated by TTS and has been recommended to discharge to an outpatient facility with no need for inpatient psychiatric services.  She has overall done quite well during her hospitalization with her new dose of Risperdal as noted above.  She is otherwise ready and stable for discharge and has been seen by physical therapy with documented need for home health PT services.  She will also have hospital bed delivered to her.  Of note, she was initially seen by neurology and had full work-up for possible CVA as well as seizures and brain MRI as well as EEG were within normal limits with no acute findings.  Discharge Diagnoses:  Principal Problem:   Acute encephalopathy Active Problems:   Normocytic anemia   Schizophrenia (HCC)   Essential hypertension   Chronic diastolic heart failure (HCC)   Cerebral infarction (HCC)   CKD (chronic kidney disease), stage IV (HCC)   Type 2 diabetes mellitus ([place])   Hypothyroidism   Pressure injury of skin  Principal discharge diagnosis: Acute metabolic encephalopathy with brief psychotic disorder and associated suicidal ideation secondary to enterococcal UTI.  Discharge Instructions  Discharge Instructions    DME Hospital bed   Complete by:  As directed    Bed type:  Semi-electric   Diet - low sodium heart healthy   Complete by:  As directed    Increase activity slowly   Complete by:  As directed      Allergies as of 09/19/2018      Reactions   Benztropine Mesylate Other (See Comments)  Alopecia and white spots on eyes   Codeine Swelling, Palpitations, Other (See Comments)   Mouth Swelling and Tachycardia   Diazepam Other (See Comments)   COMA   Diphenhydramine Hcl Anxiety, Palpitations, Other (See Comments)   Tachycardia and anxiousness   Penicillins Other (See Comments)   REACTION:  ulcers in mouth, swelling, throat swelling  Has patient had a PCN reaction causing immediate rash, facial/tongue/throat swelling, SOB or lightheadedness with hypotension: YES Has patient had a PCN reaction causing severe rash involving Mucus membranes or skin necrosis: Unknown Has patient had a PCN reaction that required hospitalization Unknown Has patient had a PCN reaction occurring within the last 10 years: unknown If all of the above answers are "NO", then may proceed with Cephalospori   Sulfonamide Derivatives Rash   Blisters, also   Thioridazine Hcl Swelling   Benztropine Other (See Comments)   "Alopecia and white spots on eyes"   Penicillin G Swelling, Other (See Comments)   Ulcers in mouth & tongue became swollen Has patient had a PCN reaction causing immediate rash, facial/tongue/throat swelling, SOB or lightheadedness with hypotension: Yes Has patient had a PCN reaction causing severe rash involving mucus membranes or skin necrosis: Unk Has patient had a PCN reaction that required hospitalization: Unk Has patient had a PCN reaction occurring within the last 10 years: Unk If all of the above answers are "NO", then may proceed with Cephalosporin use.   Lisinopril Cough   Sulfamethoxazole Rash      Medication List    STOP taking these medications   doxycycline 100 MG capsule Commonly known as:  VIBRAMYCIN     TAKE these medications   acetaminophen 325 MG tablet Commonly known as:  TYLENOL Take 325 mg by mouth 2 (two) times daily.   albuterol (2.5 MG/3ML) 0.083% nebulizer solution Commonly known as:  PROVENTIL Take 2.5 mg by nebulization every 6 (six) hours as needed for wheezing.   VENTOLIN HFA 108 (90 Base) MCG/ACT inhaler Generic drug:  albuterol Inhale 1-2 puffs into the lungs every 6 (six) hours as needed for wheezing or shortness of breath.   amLODipine 5 MG tablet Commonly known as:  NORVASC Take 1 tablet (5 mg total) by mouth daily.   aspirin 81 MG  tablet Take 81 mg by mouth daily.   atorvastatin 40 MG tablet Commonly known as:  LIPITOR Take 40 mg by mouth at bedtime.   carvedilol 6.25 MG tablet Commonly known as:  COREG Take 1 tablet (6.25 mg total) by mouth 2 (two) times daily with a meal.   DAILY VITE PO Take 1 tablet by mouth daily.   docusate sodium 100 MG capsule Commonly known as:  COLACE Take 100 mg by mouth daily.   EPINEPHrine 0.3 mg/0.3 mL Devi Commonly known as:  EPI-PEN Inject 0.3 mg into the muscle once as needed (for bee stings, then go to the ED immediately after using).   FLEET LIQUID GLYCERIN SUPP RE Place 1 suppository rectally daily as needed (for constipation).   gabapentin 300 MG capsule Commonly known as:  NEURONTIN Take 300 mg by mouth 2 (two) times daily.   glipiZIDE 5 MG 24 hr tablet Commonly known as:  GLUCOTROL XL Take 5 mg by mouth 2 (two) times daily.   haloperidol 0.5 MG tablet Commonly known as:  HALDOL Take 1 tablet (0.5 mg total) by mouth 2 (two) times daily.   hydrocortisone 2.5 % ointment Apply 1 application topically daily as needed (itchy skin).   levETIRAcetam 500  MG tablet Commonly known as:  KEPPRA Take 1 tablet (500 mg total) by mouth 2 (two) times daily.   levothyroxine 25 MCG tablet Commonly known as:  SYNTHROID, LEVOTHROID Take 1 tablet (25 mcg total) by mouth daily at 6 (six) AM.   linagliptin 5 MG Tabs tablet Commonly known as:  TRADJENTA Take 5 mg by mouth daily.   loperamide 2 MG capsule Commonly known as:  IMODIUM Take 2 mg by mouth 3 (three) times daily as needed (for diarrhea).   montelukast 10 MG tablet Commonly known as:  SINGULAIR Take 10 mg by mouth at bedtime.   nystatin 100000 UNIT/ML suspension Commonly known as:  MYCOSTATIN Take 5 mLs by mouth 3 (three) times daily. 10 day course starting on 09/06/2018   omeprazole 20 MG capsule Commonly known as:  PRILOSEC Take 20 mg by mouth daily.   OVER THE COUNTER MEDICATION CPAP    phenazopyridine 100 MG tablet Commonly known as:  PYRIDIUM Take 200 mg by mouth 3 (three) times daily as needed for pain.   polyethylene glycol powder powder Commonly known as:  GLYCOLAX/MIRALAX Take 17 g by mouth daily as needed (for constipation).   pyridOXINE 100 MG tablet Commonly known as:  VITAMIN B-6 Take 200 mg by mouth 2 (two) times daily.   risperiDONE 0.5 MG tablet Commonly known as:  RISPERDAL Take 1 tablet (0.5 mg total) by mouth 2 (two) times daily for 30 days. Please give 0.5mg  in am and 1mg  at bedtime.   spironolactone 25 MG tablet Commonly known as:  ALDACTONE Take 1 tablet (25 mg total) by mouth daily.   torsemide 20 MG tablet Commonly known as:  DEMADEX Take 60 mg alternating with 40 mg daily. What changed:    how much to take  how to take this  when to take this  additional instructions   TRESIBA FLEXTOUCH 100 UNIT/ML Sopn FlexTouch Pen Generic drug:  insulin degludec Inject 20 Units into the skin at bedtime.   TRULICITY 1.67 YO/3.7CH Sopn Generic drug:  Dulaglutide Inject 1.5 mg into the skin once a week.   Vitamin D3 50 MCG (2000 UT) Tabs Take 4,000 Units by mouth daily.            Durable Medical Equipment  (From admission, onward)         Start     Ordered   09/19/18 1115  For home use only DME Hospital bed  Once    Question Answer Comment  Patient has (list medical condition): hear failure   The above medical condition requires: Patient requires the ability to reposition frequently   Head must be elevated greater than: 30 degrees   Bed type Semi-electric      09/19/18 1118   09/19/18 0000  DME Hospital bed    Question:  Bed type  Answer:  Semi-electric   09/19/18 1121         Follow-up Information    Vyas, Dhruv B, MD Follow up in 1 week(s).   Specialty:  Internal Medicine Contact information: [place] [address] [phone]        Satira Sark, MD .   Specialty:  Cardiology Contact  information: [place] [address] [phone]          Allergies  Allergen Reactions  . Benztropine Mesylate Other (See Comments)    Alopecia and white spots on eyes  . Codeine Swelling, Palpitations and Other (See Comments)    Mouth Swelling  and Tachycardia   . Diazepam Other (See Comments)    COMA  . Diphenhydramine Hcl Anxiety, Palpitations and Other (See Comments)    Tachycardia and anxiousness  . Penicillins Other (See Comments)    REACTION: ulcers in mouth, swelling, throat swelling  Has patient had a PCN reaction causing immediate rash, facial/tongue/throat swelling, SOB or lightheadedness with hypotension: YES Has patient had a PCN reaction causing severe rash involving Mucus membranes or skin necrosis: Unknown Has patient had a PCN reaction that required hospitalization Unknown Has patient had a PCN reaction occurring within the last 10 years: unknown If all of the above answers are "NO", then may proceed with Cephalospori  . Sulfonamide Derivatives Rash    Blisters, also  . Thioridazine Hcl Swelling  . Benztropine Other (See Comments)    "Alopecia and white spots on eyes"  . Penicillin G Swelling and Other (See Comments)    Ulcers in mouth & tongue became swollen Has patient had a PCN reaction causing immediate rash, facial/tongue/throat swelling, SOB or lightheadedness with hypotension: Yes Has patient had a PCN reaction causing severe rash involving mucus membranes or skin necrosis: Unk Has patient had a PCN reaction that required hospitalization: Unk Has patient had a PCN reaction occurring within the last 10 years: Unk If all of the above answers are "NO", then may proceed with Cephalosporin use.   [name] Kitchen Lisinopril Cough  . Sulfamethoxazole Rash    Consultations:  Neurology  TTS   Procedures/Studies: Ct Angio Head W Or Wo Contrast  Result Date: 09/15/2018 CLINICAL DATA:  Altered level of consciousness.  Stroke. EXAM: CT ANGIOGRAPHY  HEAD AND NECK CT PERFUSION BRAIN TECHNIQUE: Multidetector CT imaging of the head and neck was performed using the standard protocol during bolus administration of intravenous contrast. Multiplanar CT image reconstructions and MIPs were obtained to evaluate the vascular anatomy. Carotid stenosis measurements (when applicable) are obtained utilizing NASCET criteria, using the distal internal carotid diameter as the denominator. Multiphase CT imaging of the brain was performed following IV bolus contrast injection. Subsequent parametric perfusion maps were calculated using RAPID software. CONTRAST:  132mL ISOVUE-370 IOPAMIDOL (ISOVUE-370) INJECTION 76% COMPARISON:  Noncontrast head CT from earlier today. FINDINGS: CTA NECK FINDINGS Aortic arch: Atherosclerotic calcification.  Three vessel branching. Right carotid system: Vessels are smooth and widely patent. No noted atheromatous changes. Left carotid system: Vessels are smooth and widely patent. Mild calcified plaque at the ICA bulb. Vertebral arteries: Proximal subclavian arteries are widely patent. Symmetric vertebral arteries that are smooth and widely patent to the dura Skeleton: No acute finding Other neck: No significant incidental finding. Generalized mucosal thickening in the paranasal sinuses. Upper chest: Tracheobronchomalacia with prominent airway narrowing. Bilateral cataract resection. Prominent mediastinal lymph nodes but not pathologic by short axis measurement. Review of the MIP images confirms the above findings CTA HEAD FINDINGS Anterior circulation: Atherosclerotic calcification of the carotid siphons. No branch occlusion, aneurysm, or flow limiting stenosis. Posterior circulation: Symmetric vertebral arteries. The vertebrobasilar arteries are smooth and widely patent. Fetal type right PCA. High-grade narrowing of the distal right P2 segment, stable from 07/27/2018 MRA Venous sinuses: Patent Anatomic variants: As above Delayed phase: Not obtained in  the emergent setting Review of the MIP images confirms the above findings CT Brain Perfusion Findings: CBF (<30%) Volume: 73mL Perfusion (Tmax>6.0s) volume: 34mL IMPRESSION: 1. No emergent large vessel occlusion or infarct/ischemia by CT perfusion. 2. Very mild atherosclerosis in the neck. 3. Focal high-grade right P2 segment stenosis that is stable from prior MRA  4. Incidental prominent tracheobronchomalacia. Electronically Signed   By: Monte Fantasia M.D.   On: 09/15/2018 09:52   Ct Head Wo Contrast  Result Date: 09/15/2018 CLINICAL DATA:  LEFT-sided weakness since last night, slurred speech this morning, altered level of consciousness, history CHF, stage III chronic kidney disease, COPD, essential hypertension, type II diabetes mellitus, prior pulmonary embolism EXAM: CT HEAD WITHOUT CONTRAST TECHNIQUE: Contiguous axial images were obtained from the base of the skull through the vertex without intravenous contrast. Sagittal and coronal MPR images reconstructed from axial data set. COMPARISON:  08/22/2018 FINDINGS: Brain: Generalized atrophy. Normal ventricular morphology. No midline shift or mass effect. Small vessel chronic ischemic changes of deep cerebral white matter. Old lacunar infarct versus prominent perivascular space at LEFT basal ganglia. No intracranial hemorrhage, mass lesion, evidence of acute infarction, or extra-axial fluid collection. Vascular: Minimal atherosclerotic calcifications of internal carotid arteries at skull base. Skull: Intact.  Scattered dural calcifications bilaterally. Sinuses/Orbits: Mucosal thickening throughout ethmoid air cells and maxillary sinuses. Small mucosal retention cysts within maxillary sinuses. Other: N/A IMPRESSION: Atrophy with small vessel chronic ischemic changes of deep cerebral white matter. Old LEFT basal ganglia lacunar infarct versus prominent perivascular space unchanged. No acute intracranial abnormalities. Electronically Signed   By: Lavonia Dana M.D.    On: 09/15/2018 08:29   Ct Head Wo Contrast  Result Date: 08/22/2018 CLINICAL DATA:  Altered level of consciousness (LOC), unexplained EXAM: CT HEAD WITHOUT CONTRAST TECHNIQUE: Contiguous axial images were obtained from the base of the skull through the vertex without intravenous contrast. COMPARISON:  Head CT 8 hours ago. FINDINGS: Brain: No change from prior exam. Remote lacunar infarct in left basal ganglia and chronic small vessel ischemia, stable. No intracranial hemorrhage, mass effect, or midline shift. No hydrocephalus. The basilar cisterns are patent. No evidence of territorial infarct or acute ischemia. No extra-axial or intracranial fluid collection. Vascular: Atherosclerosis of skullbase vasculature without hyperdense vessel or abnormal calcification. Skull: No fracture or focal lesion. Suspect hyperostosis rather than extra-axial meningioma in the frontal regions bilaterally. Sinuses/Orbits: No acute finding. Other: None. IMPRESSION: No acute intracranial abnormality or change from prior exams. Electronically Signed   By: Keith Rake M.D.   On: 08/22/2018 02:38   Ct Angio Neck W And/or Wo Contrast  Result Date: 09/15/2018 CLINICAL DATA:  Altered level of consciousness.  Stroke. EXAM: CT ANGIOGRAPHY HEAD AND NECK CT PERFUSION BRAIN TECHNIQUE: Multidetector CT imaging of the head and neck was performed using the standard protocol during bolus administration of intravenous contrast. Multiplanar CT image reconstructions and MIPs were obtained to evaluate the vascular anatomy. Carotid stenosis measurements (when applicable) are obtained utilizing NASCET criteria, using the distal internal carotid diameter as the denominator. Multiphase CT imaging of the brain was performed following IV bolus contrast injection. Subsequent parametric perfusion maps were calculated using RAPID software. CONTRAST:  125mL ISOVUE-370 IOPAMIDOL (ISOVUE-370) INJECTION 76% COMPARISON:  Noncontrast head CT from earlier  today. FINDINGS: CTA NECK FINDINGS Aortic arch: Atherosclerotic calcification.  Three vessel branching. Right carotid system: Vessels are smooth and widely patent. No noted atheromatous changes. Left carotid system: Vessels are smooth and widely patent. Mild calcified plaque at the ICA bulb. Vertebral arteries: Proximal subclavian arteries are widely patent. Symmetric vertebral arteries that are smooth and widely patent to the dura Skeleton: No acute finding Other neck: No significant incidental finding. Generalized mucosal thickening in the paranasal sinuses. Upper chest: Tracheobronchomalacia with prominent airway narrowing. Bilateral cataract resection. Prominent mediastinal lymph nodes but not pathologic by short  axis measurement. Review of the MIP images confirms the above findings CTA HEAD FINDINGS Anterior circulation: Atherosclerotic calcification of the carotid siphons. No branch occlusion, aneurysm, or flow limiting stenosis. Posterior circulation: Symmetric vertebral arteries. The vertebrobasilar arteries are smooth and widely patent. Fetal type right PCA. High-grade narrowing of the distal right P2 segment, stable from 07/27/2018 MRA Venous sinuses: Patent Anatomic variants: As above Delayed phase: Not obtained in the emergent setting Review of the MIP images confirms the above findings CT Brain Perfusion Findings: CBF (<30%) Volume: 20mL Perfusion (Tmax>6.0s) volume: 65mL IMPRESSION: 1. No emergent large vessel occlusion or infarct/ischemia by CT perfusion. 2. Very mild atherosclerosis in the neck. 3. Focal high-grade right P2 segment stenosis that is stable from prior MRA 4. Incidental prominent tracheobronchomalacia. Electronically Signed   By: Monte Fantasia M.D.   On: 09/15/2018 09:52   Mr Brain Wo Contrast  Result Date: 09/15/2018 CLINICAL DATA:  Hallucinations.  Slurred speech. EXAM: MRI HEAD WITHOUT CONTRAST TECHNIQUE: Multiplanar, multiecho pulse sequences of the brain and surrounding  structures were obtained without intravenous contrast. COMPARISON:  CTA and CTP 09/15/2018.  MR head 07/27/2018. FINDINGS: The patient was unable to remain motionless for the exam. Small or subtle lesions could be overlooked. Brain: No evidence for acute infarction, hemorrhage, mass lesion, hydrocephalus, or extra-axial fluid. Cerebral and cerebellar atrophy, premature for age. Mild subcortical and periventricular T2 and FLAIR hyperintensities, likely chronic microvascular ischemic change. Vascular: Flow voids are maintained. Skull and upper cervical spine: Normal marrow signal. Sinuses/Orbits: Chronic paranasal sinus disease. No layering fluid. Negative orbits. Other: None. IMPRESSION: Atrophy and small vessel disease.  No acute intracranial findings. Electronically Signed   By: Staci Righter M.D.   On: 09/15/2018 15:40   US Carotid Bilateral (at [facility] And [facility] Only)  Result Date: 09/15/2018 CLINICAL DATA:  Altered mental status, hypertension, hyperlipidemia EXAM: BILATERAL CAROTID DUPLEX ULTRASOUND TECHNIQUE: Pearline Cables scale imaging, color Doppler and duplex ultrasound were performed of bilateral carotid and vertebral arteries in the neck. COMPARISON:  None. FINDINGS: Criteria: Quantification of carotid stenosis is based on velocity parameters that correlate the residual internal carotid diameter with NASCET-based stenosis levels, using the diameter of the distal internal carotid lumen as the denominator for stenosis measurement. The following velocity measurements were obtained: RIGHT ICA: 93/23 cm/sec CCA: 267/12 cm/sec SYSTOLIC ICA/CCA RATIO:  0.8 ECA: 99 cm/sec LEFT ICA: 117/32 cm/sec CCA: 458/09 cm/sec SYSTOLIC ICA/CCA RATIO:  1.0 ECA: 107 cm/sec RIGHT CAROTID ARTERY: No significant atherosclerotic plaque formation. No hemodynamically significant right ICA stenosis, velocity elevation, or turbulent flow. Degree of narrowing less than 50%. RIGHT VERTEBRAL ARTERY:  Antegrade LEFT CAROTID ARTERY: minor echogenic  plaque formation. No hemodynamically significant left ICA stenosis, velocity elevation, or turbulent flow. LEFT VERTEBRAL ARTERY:  Antegrade IMPRESSION: Minor carotid atherosclerosis. No hemodynamically significant ICA stenosis. Degree of narrowing less than 50% bilaterally by ultrasound criteria. Patent antegrade vertebral flow bilaterally Electronically Signed   By: Jerilynn Mages.  Shick M.D.   On: 09/15/2018 16:16   Ct Cerebral Perfusion W Contrast  Result Date: 09/15/2018 CLINICAL DATA:  Altered level of consciousness.  Stroke. EXAM: CT ANGIOGRAPHY HEAD AND NECK CT PERFUSION BRAIN TECHNIQUE: Multidetector CT imaging of the head and neck was performed using the standard protocol during bolus administration of intravenous contrast. Multiplanar CT image reconstructions and MIPs were obtained to evaluate the vascular anatomy. Carotid stenosis measurements (when applicable) are obtained utilizing NASCET criteria, using the distal internal carotid diameter as the denominator. Multiphase CT imaging of the brain was performed  following IV bolus contrast injection. Subsequent parametric perfusion maps were calculated using RAPID software. CONTRAST:  162mL ISOVUE-370 IOPAMIDOL (ISOVUE-370) INJECTION 76% COMPARISON:  Noncontrast head CT from earlier today. FINDINGS: CTA NECK FINDINGS Aortic arch: Atherosclerotic calcification.  Three vessel branching. Right carotid system: Vessels are smooth and widely patent. No noted atheromatous changes. Left carotid system: Vessels are smooth and widely patent. Mild calcified plaque at the ICA bulb. Vertebral arteries: Proximal subclavian arteries are widely patent. Symmetric vertebral arteries that are smooth and widely patent to the dura Skeleton: No acute finding Other neck: No significant incidental finding. Generalized mucosal thickening in the paranasal sinuses. Upper chest: Tracheobronchomalacia with prominent airway narrowing. Bilateral cataract resection. Prominent mediastinal lymph  nodes but not pathologic by short axis measurement. Review of the MIP images confirms the above findings CTA HEAD FINDINGS Anterior circulation: Atherosclerotic calcification of the carotid siphons. No branch occlusion, aneurysm, or flow limiting stenosis. Posterior circulation: Symmetric vertebral arteries. The vertebrobasilar arteries are smooth and widely patent. Fetal type right PCA. High-grade narrowing of the distal right P2 segment, stable from 07/27/2018 MRA Venous sinuses: Patent Anatomic variants: As above Delayed phase: Not obtained in the emergent setting Review of the MIP images confirms the above findings CT Brain Perfusion Findings: CBF (<30%) Volume: 48mL Perfusion (Tmax>6.0s) volume: 26mL IMPRESSION: 1. No emergent large vessel occlusion or infarct/ischemia by CT perfusion. 2. Very mild atherosclerosis in the neck. 3. Focal high-grade right P2 segment stenosis that is stable from prior MRA 4. Incidental prominent tracheobronchomalacia. Electronically Signed   By: Monte Fantasia M.D.   On: 09/15/2018 09:52   Dg Chest Port 1 View  Result Date: 09/18/2018 CLINICAL DATA:  Evaluate for possible aspiration. EXAM: PORTABLE CHEST 1 VIEW COMPARISON:  September 15, 2018 FINDINGS: The study is limited due to the low volume portable technique. The heart, hila, mediastinum, lungs, and pleura are unremarkable. No convincing evidence of aspiration. IMPRESSION: Limited study.  No convincing evidence of aspiration. Electronically Signed   By: Dorise Bullion III M.D   On: 09/18/2018 19:53   Dg Chest Portable 1 View  Result Date: 09/15/2018 CLINICAL DATA:  68 y/o  F; wheezing and cough. EXAM: PORTABLE CHEST 1 VIEW COMPARISON:  08/16/2018 chest radiograph FINDINGS: Stable cardiomegaly given projection and technique. Pulmonary vascular congestion. No focal consolidation. No pleural effusion or pneumothorax. No acute osseous abnormality is evident. IMPRESSION: Cardiomegaly and pulmonary vascular congestion. No focal  consolidation. Electronically Signed   By: Kristine Garbe M.D.   On: 09/15/2018 04:38     Discharge Exam: Vitals:   09/19/18 0007 09/19/18 0547  BP:  (!) 129/98  Pulse: 82 84  Resp: 18 20  Temp:  98.3 F (36.8 C)  SpO2: 97% 91%   Vitals:   09/18/18 1945 09/18/18 2126 09/19/18 0007 09/19/18 0547  BP:  (!) 176/72  (!) 129/98  Pulse:  85 82 84  Resp:  20 18 20   Temp:  98.6 F (37 C)  98.3 F (36.8 C)  TempSrc:  Oral  Oral  SpO2: 97% (!) 89% 97% 91%  Weight:      Height:        General: Pt is alert, awake, not in acute distress Cardiovascular: RRR, S1/S2 +, no rubs, no gallops Respiratory: CTA bilaterally, no wheezing, no rhonchi Abdominal: Soft, NT, ND, bowel sounds + Extremities: no edema, no cyanosis    The results of significant diagnostics from this hospitalization (including imaging, microbiology, ancillary and laboratory) are listed below for reference.  Microbiology: Recent Results (from the past 240 hour(s))  Urine culture     Status: Abnormal   Collection Time: 09/13/18  3:25 AM  Result Value Ref Range Status   Specimen Description   Final    URINE, CLEAN CATCH Performed at [facility], [address] [place], [place] [address]    Special Requests   Final    NONE Performed at [facility], [address], [place], [place] [address]    Culture >=100,000 COLONIES/mL ENTEROCOCCUS FAECALIS (A)  Final   Report Status 09/15/2018 FINAL  Final   Organism ID, Bacteria ENTEROCOCCUS FAECALIS (A)  Final      Susceptibility   Enterococcus faecalis - MIC*    AMPICILLIN <=2 SENSITIVE Sensitive     LEVOFLOXACIN 1 SENSITIVE Sensitive     NITROFURANTOIN <=16 SENSITIVE Sensitive     VANCOMYCIN 1 SENSITIVE Sensitive     * >=100,000 COLONIES/mL ENTEROCOCCUS FAECALIS  MRSA PCR Screening     Status: None   Collection Time: 09/15/18 11:57 AM  Result Value Ref Range Status   MRSA by PCR NEGATIVE NEGATIVE Final    Comment:        The GeneXpert MRSA Assay  (FDA approved for NASAL specimens only), is one component of a comprehensive MRSA colonization surveillance program. It is not intended to diagnose MRSA infection nor to guide or monitor treatment for MRSA infections. Performed at [facility], [address], [place], [place] [address]      Labs: BNP (last 3 results) Recent Labs    01/12/18 1056 08/15/18 1503  BNP 145.3* 409.7   Basic Metabolic Panel: Recent Labs  Lab 09/13/18 0038 09/15/18 0854 09/17/18 0448 09/18/18 0506  NA 137 140 140 141  K 3.7 3.7 3.9 4.2  CL 107 110 112* 112*  CO2 24 23 21* 23  GLUCOSE 224* 163* 181* 188*  BUN 28* 36* 31* 33*  CREATININE 2.19* 2.28* 2.04* 2.14*  CALCIUM 8.6* 8.6* 8.7* 8.9   Liver Function Tests: Recent Labs  Lab 09/13/18 0038 09/15/18 0854  AST 18 20  ALT 23 23  ALKPHOS 76 74  BILITOT 0.4 0.3  PROT 6.2* 6.3*  ALBUMIN 3.0* 2.8*   No results for input(s): LIPASE, AMYLASE in the last 168 hours. Recent Labs  Lab 09/15/18 1204  AMMONIA 20   CBC: Recent Labs  Lab 09/13/18 0038 09/15/18 0854 09/17/18 0448 09/18/18 0506  WBC 6.4 6.2 6.0 6.1  NEUTROABS 4.5 4.1  --   --   HGB 8.6* 8.2* 7.6* 7.9*  HCT 25.9* 25.1* 23.4* 24.5*  MCV 95.9 96.9 97.5 98.0  PLT 160 152 155 152   Cardiac Enzymes: Recent Labs  Lab 09/15/18 2001 09/16/18 0206 09/16/18 0830 09/16/18 1737  CKTOTAL 73  --   --   --   TROPONINI <0.03 <0.03 <0.03 <0.03   BNP: Invalid input(s): POCBNP CBG: Recent Labs  Lab 09/18/18 1609 09/18/18 2349 09/18/18 2359 09/19/18 0550 09/19/18 0846  GLUCAP 216* 201* 197* 141* 172*   D-Dimer No results for input(s): DDIMER in the last 72 hours. Hgb A1c No results for input(s): HGBA1C in the last 72 hours. Lipid Profile No results for input(s): CHOL, HDL, LDLCALC, TRIG, CHOLHDL, LDLDIRECT in the last 72 hours. Thyroid function studies No results for input(s): TSH, T4TOTAL, T3FREE, THYROIDAB in the last 72 hours.  Invalid input(s): FREET3 Anemia  work up Recent Labs    09/17/18 1041  VITAMINB12 534  FOLATE 28.3  FERRITIN 23  TIBC 258  IRON 72  RETICCTPCT 2.9   Urinalysis    Component Value Date/Time   COLORURINE YELLOW 09/13/2018 0325   APPEARANCEUR CLEAR 09/13/2018 0325   LABSPEC 1.010 09/13/2018 0325   PHURINE 6.0 09/13/2018 0325   GLUCOSEU >=500 (A) 09/13/2018 0325   HGBUR NEGATIVE 09/13/2018 0325   HGBUR negative 10/20/2009 1425   BILIRUBINUR NEGATIVE 09/13/2018 0325   KETONESUR NEGATIVE 09/13/2018 0325   PROTEINUR >=300 (A) 09/13/2018 0325   UROBILINOGEN 0.2 10/05/2011 1002   NITRITE NEGATIVE 09/13/2018 0325   LEUKOCYTESUR NEGATIVE 09/13/2018 0325   Sepsis Labs Invalid input(s): PROCALCITONIN,  WBC,  LACTICIDVEN Microbiology Recent Results (from the past 240 hour(s))  Urine culture     Status: Abnormal   Collection Time: 09/13/18  3:25 AM  Result Value Ref Range Status   Specimen Description   Final    URINE, CLEAN CATCH Performed at [facility], [address] [place], [place] [address]    Special Requests   Final    NONE Performed at [facility], [address] [place], [place] [address]    Culture >=100,000 COLONIES/mL ENTEROCOCCUS FAECALIS (A)  Final   Report Status 09/15/2018 FINAL  Final   Organism ID, Bacteria ENTEROCOCCUS FAECALIS (A)  Final      Susceptibility   Enterococcus faecalis - MIC*    AMPICILLIN <=2 SENSITIVE Sensitive     LEVOFLOXACIN 1 SENSITIVE Sensitive     NITROFURANTOIN <=16 SENSITIVE Sensitive     VANCOMYCIN 1 SENSITIVE Sensitive     * >=100,000 COLONIES/mL ENTEROCOCCUS FAECALIS  MRSA PCR Screening     Status: None   Collection Time: 09/15/18 11:57 AM  Result Value Ref Range Status   MRSA by PCR NEGATIVE NEGATIVE Final    Comment:        The GeneXpert MRSA Assay (FDA approved for NASAL specimens only), is one component of a comprehensive MRSA colonization surveillance program. It is not intended to diagnose MRSA infection nor to guide or monitor treatment  for MRSA infections. Performed at [facility], [address], [place], [place] [address]      Time coordinating discharge: 40 minutes  SIGNED:   Rodena Goldmann, DO [facility] 09/19/2018, 11:22 AM  If 7PM-7AM, please contact night-coverage [email or web address] Password [email or web address]

## 2018-09-19 NOTE — Progress Notes (Signed)
Report given to assisted living staff on discharge, paperwork given.

## 2018-09-19 NOTE — Care Management (Signed)
[facility] made this CM aware pt active with [facility]. Cassie, [facility] rep, aware of DC to another ALF today.

## 2018-09-19 NOTE — Care Management Note (Signed)
Case Management Note  Patient Details  Name: [name] MRN: [id number] Date of Birth: [birth year]  Subjective/Objective:    Admitted with encephalopathy. Pt from [facility] AL[facility]. PT has recommended ALF with HH PT at DC. Pt has chosen [facility] for [facility] and DME, also need hospital bed.                 Action/Plan: Pt no longer wants to be at [facility] and CSW has made arrangements for pt to go to [facility]. Vaughan Basta, [facility] rep, given referral for [facility] and DME. DC planned for today.   Expected Discharge Date:       09/19/2018           Expected Discharge Plan:  Assisted Living / Rest Home  In-House Referral:  Clinical Social Work  Discharge planning Services  [place] Acute Care Choice:  Home Health, Durable Medical Equipment Choice offered to:  Patient  DME Arranged:  Hospital bed DME Agency:  [place]:  PT [place]:  [place]  Status of Service:  Completed, signed off  Sherald Barge, RN 09/19/2018, 11:19 AM

## 2018-09-19 NOTE — Clinical Social Work Placement (Addendum)
   CLINICAL SOCIAL WORK PLACEMENT  NOTE  Date:  09/19/2018  Patient Details  Name: [name] MRN: [id number] Date of Birth: [birth year]  Clinical Social Work is seeking post-discharge placement for this patient at the [facility] level of care (*CSW will initial, date and re-position this form in  chart as items are completed):  Yes   Patient/family provided with [facility] Work Department's list of facilities offering this level of care within the geographic area requested by the patient (or if unable, by the patient's family).  Yes   Patient/family informed of their freedom to choose among providers that offer the needed level of care, that participate in Medicare, Medicaid or managed care program needed by the patient, have an available bed and are willing to accept the patient.  Yes   Patient/family informed of [facility]'s ownership interest in [facility] and [facility], as well as of the fact that they are under no obligation to receive care at these facilities.  PASRR submitted to EDS on 09/18/18     PASRR number received on       Existing PASRR number confirmed on       FL2 transmitted to all facilities in geographic area requested by pt/family on 09/18/18     FL2 transmitted to all facilities within larger geographic area on       Patient informed that his/her managed care company has contracts with or will negotiate with certain facilities, including the following:            Patient/family informed of bed offers received.  Patient chooses bed at [facility])     Physician recommends and patient chooses bed at      Patient to be transferred to (see above) on 09/19/18.  Patient to be transferred to facility by (Facility)     Patient family notified on 09/19/18 of transfer.  Name of family member notified:        PHYSICIAN       Additional Comment: Pt to d/ctoday.  Facility will pick up at 2:30.  Nursing staff alerted.  Pt sent with signed FL2 .  CSW sign off   _______________________________________________ Trish Mage, LCSW 09/19/2018, 12:17 PM

## 2018-09-19 NOTE — Progress Notes (Signed)
On arrival to room with primary AM nurse. Patient c/o of chest pain with expressed difficulty to take a deep breath. Per AM nurse she had given pain medication but it had only been 15-90mins so we saw no noted/ or expressed  improvement at this time. Stat EXG ordered per CP protocol. Lungs also coarse with rhonchi in All auscultated   fields . Mid level paged with above  Findings. Chest x-ray ordered.PM  Rn will continue to assess patient as shift progresses.

## 2018-09-19 NOTE — Care Management (Cosign Needed Addendum)
Patient requires frequent re-positioning of the body in ways that cannot be achieved with an ordinary bed or wedge pillow, to eliminate pain, reduce pressure, and the head of the bed to be elevated more than 30 degrees most of the time due to CHF 

## 2018-09-19 NOTE — NC FL2 (Signed)
[place] LEVEL OF CARE SCREENING TOOL     IDENTIFICATION  Patient Name: [name] Birthdate: December 04, 1950 Sex: female Admission Date (Current Location): 09/12/2018  [facility] and [place] Number:  [employer] and Address:  [facility] [address] [place]      Provider Number: [id number]  Attending Physician Name and Address:  Rodena Goldmann, DO  Relative Name and Phone Number:       Current Level of Care: Hospital Recommended Level of Care: [facility] Prior Approval Number:    Date Approved/Denied:   PASRR Number:    Discharge Plan: Domiciliary (Rest home)    Current Diagnoses: Patient Active Problem List   Diagnosis Date Noted  . Pressure injury of skin 09/17/2018  . Respiratory failure ([place]) 08/16/2018  . Seizure ([place]) 08/16/2018  . Acute encephalopathy 07/27/2018  . Endotracheally intubated 07/27/2018  . Hypothyroidism 07/27/2018  . Pleuritic chest pain 07/14/2018  . Moderate persistent asthma without complication   . Type 2 diabetes mellitus ([place]) 04/19/2018  . Acute kidney injury superimposed on chronic kidney disease ([place]) 01/13/2018  . Altered mental status   . Encephalopathy 10/11/2016  . Chest pain 08/12/2016  . CKD (chronic kidney disease), stage IV ([place]) 08/12/2016  . History of pulmonary embolus (PE) 08/12/2016  . Obesity, Class III, BMI 40-49.9 (morbid obesity) ([place]) 08/12/2016  . RBBB 08/12/2016  . Positive D dimer 08/12/2016  . Cerebral infarction ([place]) 11/10/2015  . Physical deconditioning 07/01/2011  . Weakness 07/01/2011  . Diabetic foot ulcer ([place]) 05/19/2011  . Polypharmacy 02/09/2011  . BACK PAIN WITH RADICULOPATHY 05/25/2010  . Type 2 diabetes mellitus with diabetic polyneuropathy, without long-term current use of insulin ([place]) 08/18/2009  . Chronic diastolic heart failure ([place]) 04/25/2009  . Normocytic anemia 04/14/2009  . Sleep apnea 12/11/2008  . Diabetic peripheral  neuropathy ([place]) 05/04/2007  . HYPERCHOLESTEROLEMIA 10/13/2006  . Gout, unspecified 10/13/2006  . HYPOKALEMIA 10/13/2006  . Schizophrenia ([place]) 10/13/2006  . Depression 10/13/2006  . Essential hypertension 10/13/2006  . Venous (peripheral) insufficiency 10/13/2006  . RHINITIS, ALLERGIC 10/13/2006  . Asthma 10/13/2006  . Reflux esophagitis 10/13/2006  . OSTEOARTHRITIS, MULTI SITES 10/13/2006  . INCONTINENCE, URGE 10/13/2006    Orientation RESPIRATION BLADDER Height & Weight     Self, Place, Situation  Normal Continent Weight: 96.5 kg Height:  5\' 3"  (160 cm)  BEHAVIORAL SYMPTOMS/MOOD NEUROLOGICAL BOWEL NUTRITION STATUS  Dangerous to self, others or property(Recent past suicide attempt due to UTI) (none) Continent (Heart Healthy, carb modified)  AMBULATORY STATUS COMMUNICATION OF NEEDS Skin   Limited Assist Verbally Other (Comment)(R Foot Daibetic Ulcer crusted over; R Heel, full thickness tissue loss with ulcer)                       Personal Care Assistance Level of Assistance  Bathing, Feeding, Dressing Bathing Assistance: Limited assistance Feeding assistance: Limited assistance Dressing Assistance: Limited assistance     Functional Limitations Info  Sight, Hearing, Speech Sight Info: Adequate Hearing Info: Adequate Speech Info: Adequate    SPECIAL CARE FACTORS FREQUENCY  PT (By licensed PT)     PT Frequency: 3X/W              Contractures Contractures Info: Not present    Additional Factors Info  Code Status, Allergies, Psychotropic Code Status Info: full Allergies Info: Benztropine, Codeine, Diazepam, Diphenhydramine Hcl, Sulfonamide Derivatives, Benztropine Mesylate, Thioridazine Hcl Psychotropic Info: Haldol, Risperdal  Current Medications (09/19/2018):  This is the current hospital active medication list    Discharge Medications: Please see discharge summary for a list of discharge medications. cetaminophen 325 MG tablet  Commonly  known as: TYLENOL  Take 325 mg by mouth 2 (two) times daily.  Next dose due:            * albuterol (2.5 MG/3ML) 0.083% nebulizer solution  Commonly known as: PROVENTIL  Take 2.5 mg by nebulization every 6 (six) hours as needed for wheezing.  Next dose due:           * VENTOLIN HFA 108 (90 Base) MCG/ACT inhaler  Inhale 1-2 puffs into the lungs every 6 (six) hours as needed for wheezing or shortness of breath.  Generic drug: albuterol  Next dose due:           amLODipine 5 MG tablet  Commonly known as: NORVASC  Take 1 tablet (5 mg total) by mouth daily.  Next dose due:           aspirin 81 MG tablet  Take 81 mg by mouth daily.  Next dose due:           atorvastatin 40 MG tablet  Commonly known as: LIPITOR  Take 40 mg by mouth at bedtime.  Next dose due:           carvedilol 6.25 MG tablet  Commonly known as: COREG  Take 1 tablet (6.25 mg total) by mouth 2 (two) times daily with a meal.  Next dose due:           DAILY VITE PO  Take 1 tablet by mouth daily.  Next dose due:           docusate sodium 100 MG capsule  Commonly known as: COLACE  Take 100 mg by mouth daily.  Next dose due:           EPINEPHrine 0.3 mg/0.3 mL Devi  Commonly known as: EPI-PEN  Inject 0.3 mg into the muscle once as needed (for bee stings, then go to the ED immediately after using).  Next dose due:           FLEET LIQUID GLYCERIN SUPP RE  Place 1 suppository rectally daily as needed (for constipation).  Next dose due:           gabapentin 300 MG capsule  Commonly known as: NEURONTIN  Take 300 mg by mouth 2 (two) times daily.  Next dose due:           glipiZIDE 5 MG 24 hr tablet  Commonly known as: GLUCOTROL XL  Take 5 mg by mouth 2 (two) times daily.  Next dose due:           haloperidol 0.5 MG tablet  Commonly known as: HALDOL  Take 1 tablet (0.5 mg total) by mouth 2 (two) times daily.  Next dose due:           hydrocortisone 2.5 % ointment  Apply 1 application topically daily as needed  (itchy skin).  Next dose due:           levETIRAcetam 500 MG tablet  Commonly known as: KEPPRA  Take 1 tablet (500 mg total) by mouth 2 (two) times daily.  Next dose due:           levothyroxine 25 MCG tablet  Commonly known as: SYNTHROID, LEVOTHROID  Take 1 tablet (25 mcg total) by mouth daily at 6 (six) AM.  Next  dose due:           linagliptin 5 MG Tabs tablet  Commonly known as: TRADJENTA  Take 5 mg by mouth daily.  Next dose due:           loperamide 2 MG capsule  Commonly known as: IMODIUM  Take 2 mg by mouth 3 (three) times daily as needed (for diarrhea).  Next dose due:           montelukast 10 MG tablet  Commonly known as: SINGULAIR  Take 10 mg by mouth at bedtime.  Next dose due:           nystatin 100000 UNIT/ML suspension  Commonly known as: MYCOSTATIN  Take 5 mLs by mouth 3 (three) times daily. 10 day course starting on 09/06/2018  Next dose due:           omeprazole 20 MG capsule  Commonly known as: PRILOSEC  Take 20 mg by mouth daily.  Next dose due:           OVER THE COUNTER MEDICATION  CPAP  Next dose due:           phenazopyridine 100 MG tablet  Commonly known as: PYRIDIUM  Take 200 mg by mouth 3 (three) times daily as needed for pain.  Next dose due:           polyethylene glycol powder powder  Commonly known as: GLYCOLAX/MIRALAX  Take 17 g by mouth daily as needed (for constipation).  Next dose due:           pyridOXINE 100 MG tablet  Commonly known as: VITAMIN B-6  Take 200 mg by mouth 2 (two) times daily.  Next dose due:           risperiDONE 0.5 MG tablet  Commonly known as: RISPERDAL  Take 1 tablet (0.5 mg total) by mouth 2 (two) times daily for 30 days. Please give 0.5mg  in am and 1mg  at bedtime.  Next dose due:           spironolactone 25 MG tablet  Commonly known as: ALDACTONE  Take 1 tablet (25 mg total) by mouth daily.  Next dose due:           torsemide 20 MG tablet  Commonly known as: DEMADEX  Take 60 mg alternating with 40 mg  daily.  Next dose due:           TRESIBA FLEXTOUCH 100 UNIT/ML Sopn FlexTouch Pen  Inject 20 Units into the skin at bedtime.  Generic drug: insulin degludec  Next dose due:           TRULICITY 1.5 DG/3.8VF Sopn  Inject 1.5 mg into the skin once a week.  Generic drug: Dulaglutide  Next dose due:           Vitamin D3 50 MCG (2000 UT) Tabs  Take 4,000 Units by mouth daily.  Next dose due:             Relevant Imaging Results:  Relevant Lab Results:   Additional [place], LCSW

## 2018-09-19 NOTE — Progress Notes (Signed)
Patient appears calm all vitals within normal limits. She denies any continued chest  pain or cardiac distress at this time.

## 2018-09-19 NOTE — Progress Notes (Signed)
Physical Therapy Treatment Patient Details Name: [name] MRN: [id number] DOB: 19-Oct-1950 Today's Date: 09/19/2018    History of Present Illness [name] is a 68 y.o. female with medical history significant for schizoaffective disorder, CKD stage IV, chronic anemia, hypertension, dyslipidemia, type 2 diabetes, hypothyroidism, obesity, prior CVA, and questionable prior seizure activity who was brought to the ED on 1/29 from her assisted living facility where she was found by the nurse with a plastic bag around her head.  It appears that she had this bag around her head and an attempt to harm herself and stated to the nurse at that time that she was seeing demons and monsters that did not leave her alone.  She continued to have hallucinations with potential suicidal ideation for which she was initially given some Haldol and was assessed by psychiatry.  She awaited bed placement to inpatient psychiatry in the ED and was started on Risperdal 0.5 mg twice a day for brief psychotic disorder.  She had remained stable in the ED until this morning at which point she became much less responsive and had some slurred speech for which she underwent stroke evaluation.  She underwent multiple imaging studies with CTA as well as perfusion study with no acute findings noted.  Tele-neurology was consulted and recommended further evaluation with EEG and brain MRI.    PT Comments    Patient demonstrates increased endurance/distance for gait training without loss of balance, limited secondary to fatigue, good return for completing BLE ROM/strengthening exercises with verbal cues and demonstration.  Patient tolerated sitting up in chair after therapy.  Patient will benefit from continued physical therapy in hospital and recommended venue below to increase strength, balance, endurance for safe ADLs and gait.    Follow Up Recommendations  Home health PT;Supervision for mobility/OOB;Supervision - Intermittent      Equipment Recommendations  None recommended by PT    Recommendations for Other Services       Precautions / Restrictions Precautions Precautions: Fall Restrictions Weight Bearing Restrictions: No    Mobility  Bed Mobility Overal bed mobility: Needs Assistance Bed Mobility: Supine to Sit     Supine to sit: Min assist     General bed mobility comments: slow labored movement, difficulty propping up on elbows for supine to sitting, states she uses hospital bed with head raised at home  Transfers Overall transfer level: Needs assistance Equipment used: Rolling walker (2 wheeled) Transfers: Sit to/from [facility] Sit to Stand: Min guard Stand pivot transfers: Supervision       General transfer comment: labored movement for sit to stands  Ambulation/Gait Ambulation/Gait assistance: Supervision;Min guard Gait Distance (Feet): 80 Feet Assistive device: Rolling walker (2 wheeled) Gait Pattern/deviations: Decreased step length - right;Decreased step length - left;Decreased stride length Gait velocity: decreased   General Gait Details: slightly labored cadence without loss of balance, limited secondary to fatigue   Stairs             Wheelchair Mobility    Modified Rankin (Stroke Patients Only)       Balance Overall balance assessment: Needs assistance Sitting-balance support: Feet supported;No upper extremity supported Sitting balance-Leahy Scale: Good     Standing balance support: Bilateral upper extremity supported;During functional activity Standing balance-Leahy Scale: Fair Standing balance comment: using RW                            Cognition Arousal/Alertness: Awake/alert Behavior During Therapy: WFL for  tasks assessed/performed Overall Cognitive Status: Within Functional Limits for tasks assessed                                        Exercises General Exercises - Lower Extremity Long Arc Quad:  Seated;Strengthening;AROM;Both;10 reps Hip Flexion/Marching: Seated;Strengthening;AROM;Both;10 reps Toe Raises: Seated;Strengthening;AROM;Both;10 reps Heel Raises: Seated;AROM;Strengthening;Both;10 reps    General Comments        Pertinent Vitals/Pain Pain Assessment: No/denies pain    Home Living                      Prior Function            PT Goals (current goals can now be found in the care plan section) Acute Rehab PT Goals Patient Stated Goal: return to [facility] once medically stable PT Goal Formulation: With patient Time For Goal Achievement: 09/29/18 Potential to Achieve Goals: Good Progress towards PT goals: Progressing toward goals    Frequency    Min 3X/week      PT Plan Current plan remains appropriate    Co-evaluation              AM-PAC PT "6 Clicks" Mobility   Outcome Measure  Help needed turning from your back to your side while in a flat bed without using bedrails?: A Little Help needed moving from lying on your back to sitting on the side of a flat bed without using bedrails?: A Little Help needed moving to and from a bed to a chair (including a wheelchair)?: A Little Help needed standing up from a chair using your arms (e.g., wheelchair or bedside chair)?: A Little Help needed to walk in hospital room?: A Little Help needed climbing 3-5 steps with a railing? : A Lot 6 Click Score: 17    End of Session Equipment Utilized During Treatment: Gait belt Activity Tolerance: Patient tolerated treatment well;Patient limited by fatigue Patient left: in chair;with call bell/phone within reach;with chair alarm set Nurse Communication: Mobility status PT Visit Diagnosis: Other abnormalities of gait and mobility (R26.89);Muscle weakness (generalized) (M62.81)     Time: 1016-1040 PT Time Calculation (min) (ACUTE ONLY): 24 min  Charges:  $Gait Training: 8-22 mins $Therapeutic Exercise: 8-22 mins                     11:35 AM,  09/19/18 Lonell Grandchild, MPT Physical Therapist with [facility] [phone] office [phone] mobile phone

## 2018-09-20 DIAGNOSIS — R32 Unspecified urinary incontinence: Secondary | ICD-10-CM | POA: Diagnosis not present

## 2018-09-20 DIAGNOSIS — I10 Essential (primary) hypertension: Secondary | ICD-10-CM | POA: Diagnosis not present

## 2018-09-20 DIAGNOSIS — Z299 Encounter for prophylactic measures, unspecified: Secondary | ICD-10-CM | POA: Diagnosis not present

## 2018-09-20 DIAGNOSIS — J440 Chronic obstructive pulmonary disease with acute lower respiratory infection: Secondary | ICD-10-CM | POA: Diagnosis not present

## 2018-09-20 DIAGNOSIS — E1122 Type 2 diabetes mellitus with diabetic chronic kidney disease: Secondary | ICD-10-CM | POA: Diagnosis not present

## 2018-09-20 DIAGNOSIS — N184 Chronic kidney disease, stage 4 (severe): Secondary | ICD-10-CM | POA: Diagnosis not present

## 2018-09-20 DIAGNOSIS — G9340 Encephalopathy, unspecified: Secondary | ICD-10-CM | POA: Diagnosis not present

## 2018-09-20 DIAGNOSIS — E1165 Type 2 diabetes mellitus with hyperglycemia: Secondary | ICD-10-CM | POA: Diagnosis not present

## 2018-09-20 DIAGNOSIS — I5032 Chronic diastolic (congestive) heart failure: Secondary | ICD-10-CM | POA: Diagnosis not present

## 2018-09-20 DIAGNOSIS — I130 Hypertensive heart and chronic kidney disease with heart failure and stage 1 through stage 4 chronic kidney disease, or unspecified chronic kidney disease: Secondary | ICD-10-CM | POA: Diagnosis not present

## 2018-09-20 DIAGNOSIS — D631 Anemia in chronic kidney disease: Secondary | ICD-10-CM | POA: Diagnosis not present

## 2018-09-20 DIAGNOSIS — J209 Acute bronchitis, unspecified: Secondary | ICD-10-CM | POA: Diagnosis not present

## 2018-09-20 DIAGNOSIS — Z6839 Body mass index (BMI) 39.0-39.9, adult: Secondary | ICD-10-CM | POA: Diagnosis not present

## 2018-09-22 DIAGNOSIS — E1122 Type 2 diabetes mellitus with diabetic chronic kidney disease: Secondary | ICD-10-CM | POA: Diagnosis not present

## 2018-09-22 DIAGNOSIS — I5032 Chronic diastolic (congestive) heart failure: Secondary | ICD-10-CM | POA: Diagnosis not present

## 2018-09-22 DIAGNOSIS — I130 Hypertensive heart and chronic kidney disease with heart failure and stage 1 through stage 4 chronic kidney disease, or unspecified chronic kidney disease: Secondary | ICD-10-CM | POA: Diagnosis not present

## 2018-09-22 DIAGNOSIS — G9340 Encephalopathy, unspecified: Secondary | ICD-10-CM | POA: Diagnosis not present

## 2018-09-22 DIAGNOSIS — N184 Chronic kidney disease, stage 4 (severe): Secondary | ICD-10-CM | POA: Diagnosis not present

## 2018-09-22 DIAGNOSIS — D631 Anemia in chronic kidney disease: Secondary | ICD-10-CM | POA: Diagnosis not present

## 2018-09-25 DIAGNOSIS — E1122 Type 2 diabetes mellitus with diabetic chronic kidney disease: Secondary | ICD-10-CM | POA: Diagnosis not present

## 2018-09-25 DIAGNOSIS — N184 Chronic kidney disease, stage 4 (severe): Secondary | ICD-10-CM | POA: Diagnosis not present

## 2018-09-25 DIAGNOSIS — I5032 Chronic diastolic (congestive) heart failure: Secondary | ICD-10-CM | POA: Diagnosis not present

## 2018-09-25 DIAGNOSIS — D631 Anemia in chronic kidney disease: Secondary | ICD-10-CM | POA: Diagnosis not present

## 2018-09-25 DIAGNOSIS — G9340 Encephalopathy, unspecified: Secondary | ICD-10-CM | POA: Diagnosis not present

## 2018-09-25 DIAGNOSIS — I130 Hypertensive heart and chronic kidney disease with heart failure and stage 1 through stage 4 chronic kidney disease, or unspecified chronic kidney disease: Secondary | ICD-10-CM | POA: Diagnosis not present

## 2018-09-25 IMAGING — CT CT HEAD W/O CM
3 series · 16 of 47 positions shown, 19 images · non-contrast
Comparison: Head CT scan 04/06/2018 and 01/22/2017.

CLINICAL DATA: Hypertension today.

EXAM:
CT HEAD WITHOUT CONTRAST
TECHNIQUE: Contiguous axial images were obtained from the base of the skull
through the vertex without intravenous contrast.

[Series 2: head wo · axial · 0.41mm/px · z∈[+1623,+1748]mm · 10 of 30 slices shown, 13 images]
[im 3/30  brain]
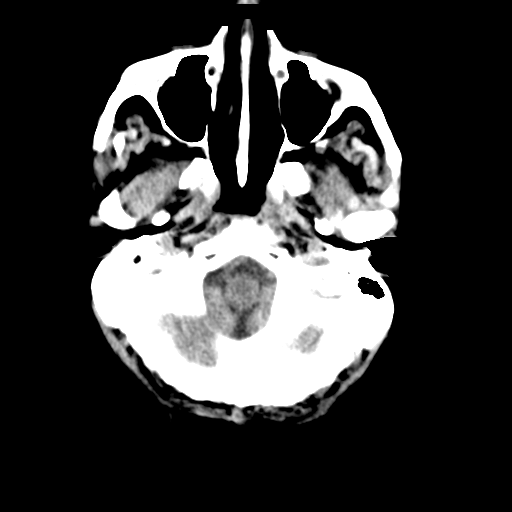
[im 3/30  bone]
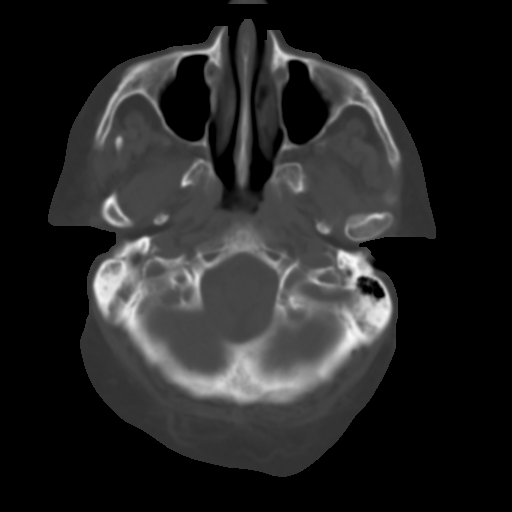
[im 6/30  brain]
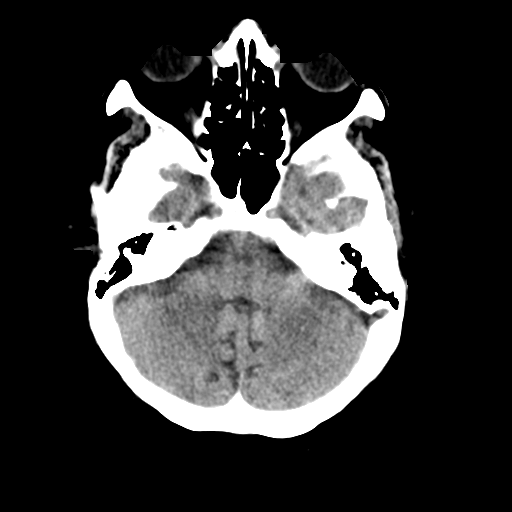
[im 9/30  brain]
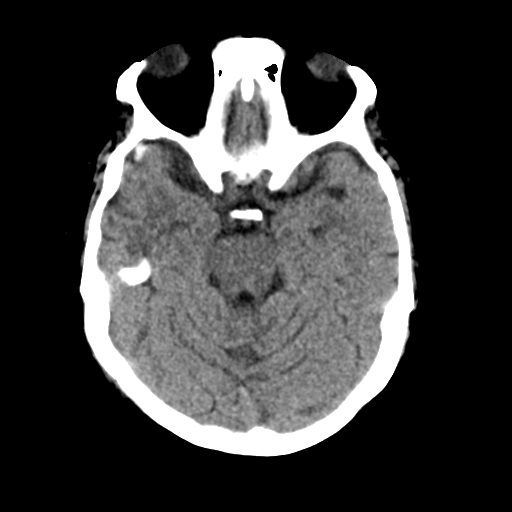
[im 11/30  brain]
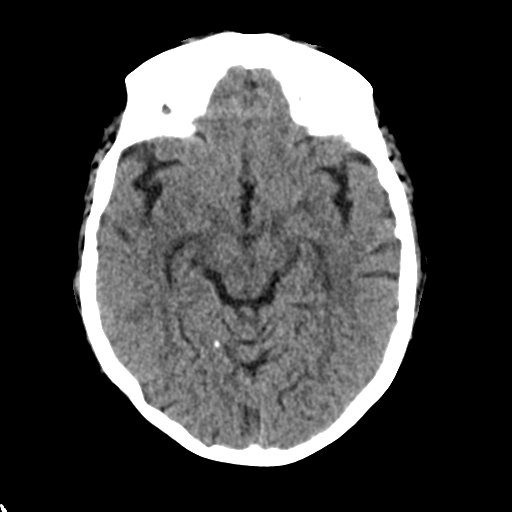
[im 14/30  brain]
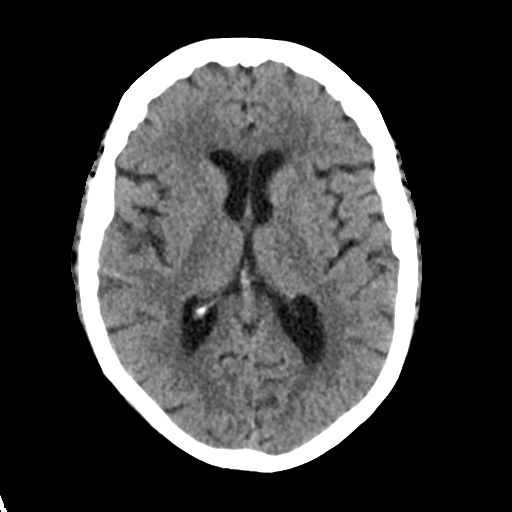
[im 14/30  bone]
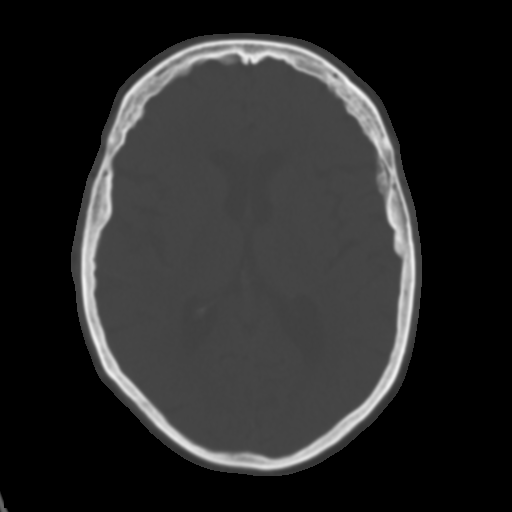
[im 17/30  brain]
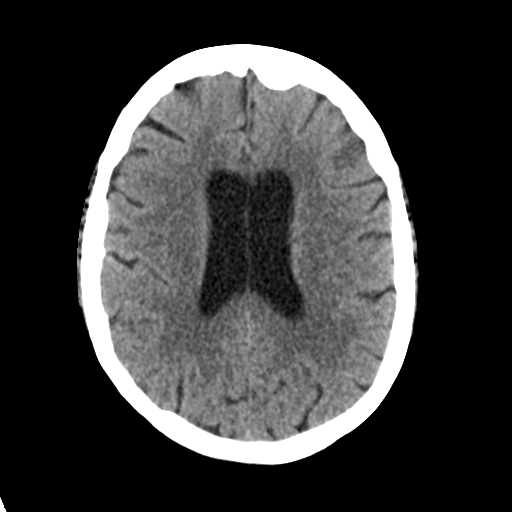
[im 20/30  brain]
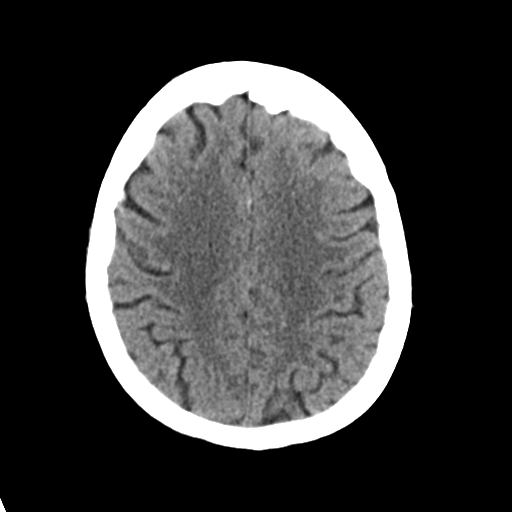
[im 23/30  brain]
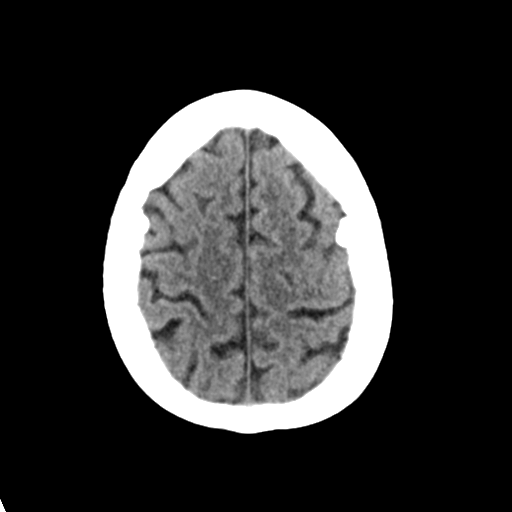
[im 25/30  brain]
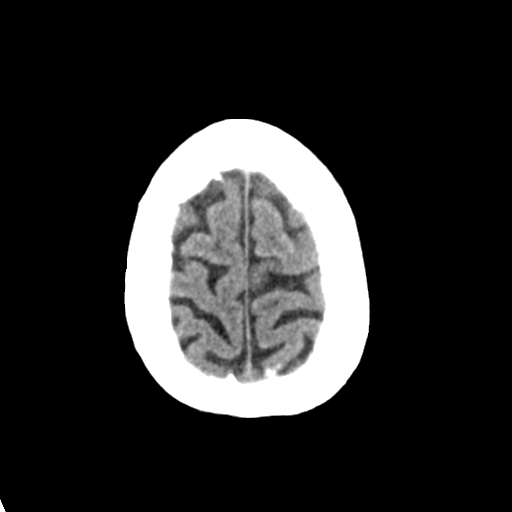
[im 25/30  bone]
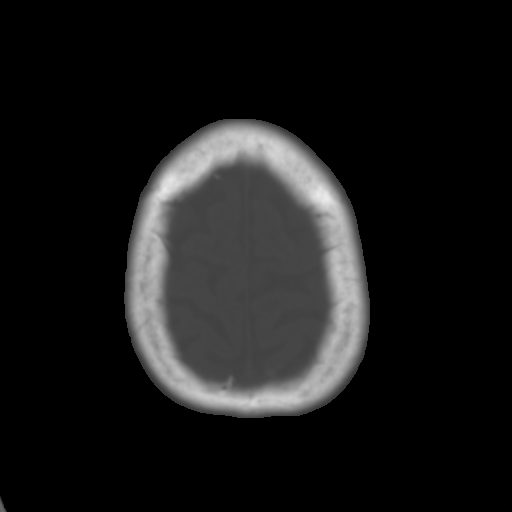
[im 28/30  brain]
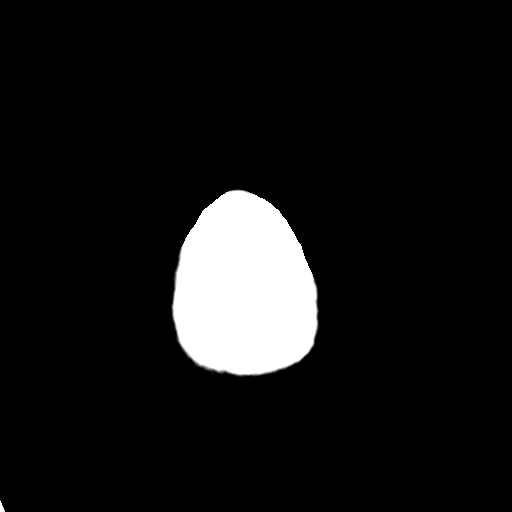

[Series 4: coronal soft tissue · coronal · 0.30mm/px · 3 of 67 slices shown]
[im 23/67  brain]
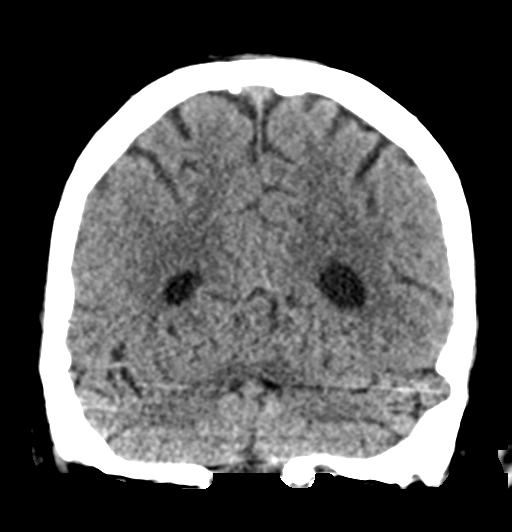
[im 30/67  brain]
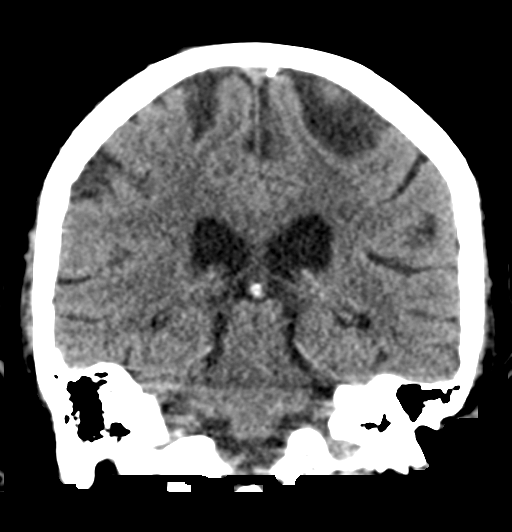
[im 37/67  brain]
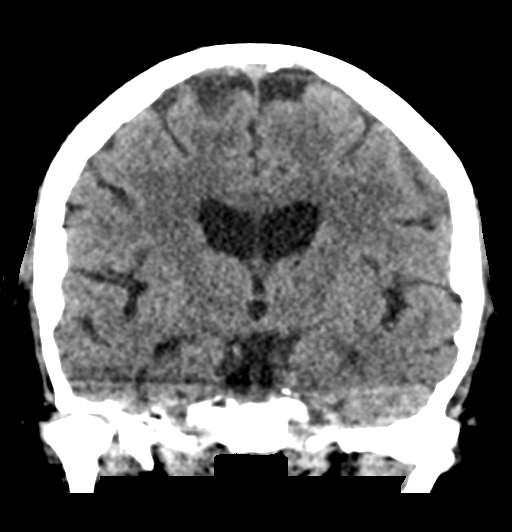

[Series 5: sagittal soft tissue · sagittal · 0.30mm/px · 3 of 53 slices shown]
[im 18/53  brain]
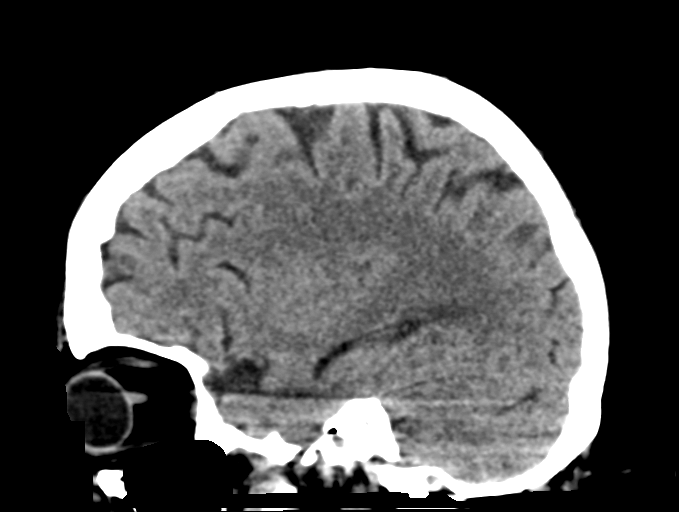
[im 27/53  brain]
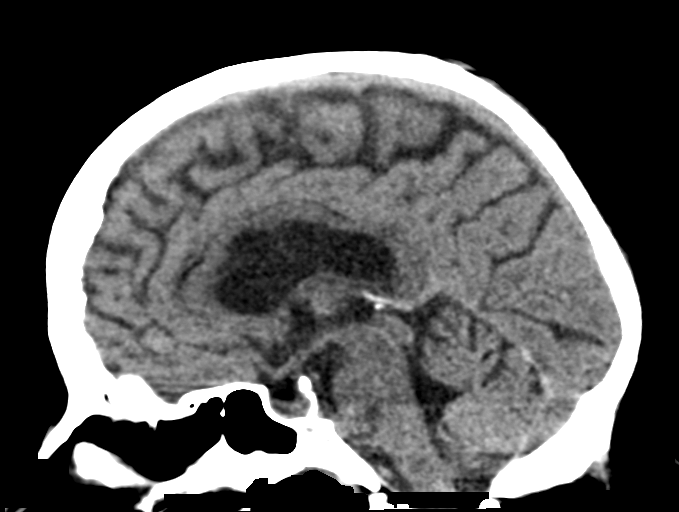
[im 35/53  brain]
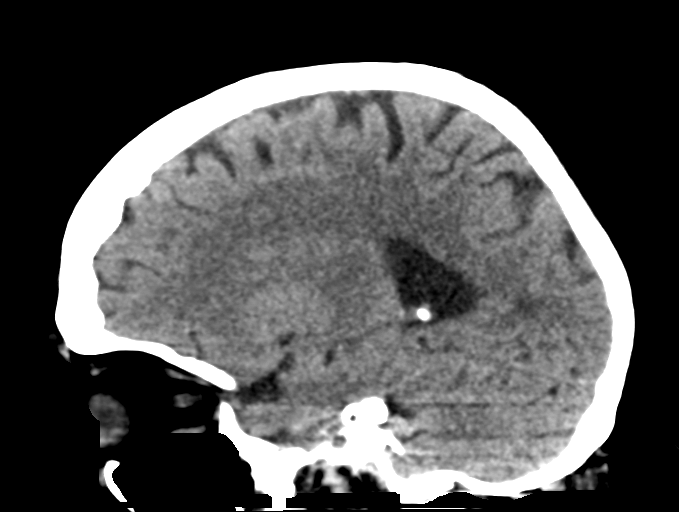

[16 of 47 positions shown; findings below may reference images not displayed]

FINDINGS: Brain: No evidence of acute infarction, hemorrhage, hydrocephalus,
extra-axial collection or mass lesion/mass effect.

Vascular: No hyperdense vessel or unexpected calcification.

Skull: Intact.  No focal lesion.

Sinuses/Orbits: Status post bilateral cataract surgery. Otherwise
negative

Other: None.
IMPRESSION: Negative head CT.

## 2018-09-27 ENCOUNTER — Emergency Department (HOSPITAL_COMMUNITY): Payer: Medicare Other

## 2018-09-27 ENCOUNTER — Encounter (HOSPITAL_COMMUNITY): Payer: Self-pay | Admitting: *Deleted

## 2018-09-27 ENCOUNTER — Emergency Department (HOSPITAL_COMMUNITY)
Admission: EM | Admit: 2018-09-27 | Discharge: 2018-09-27 | Disposition: A | Discharge status: Skilled Nursing Facility | Payer: Medicare Other | Attending: Emergency Medicine | Admitting: Emergency Medicine

## 2018-09-27 ENCOUNTER — Other Ambulatory Visit: Payer: Self-pay

## 2018-09-27 DIAGNOSIS — I10 Essential (primary) hypertension: Secondary | ICD-10-CM | POA: Diagnosis not present

## 2018-09-27 DIAGNOSIS — Z7984 Long term (current) use of oral hypoglycemic drugs: Secondary | ICD-10-CM | POA: Insufficient documentation

## 2018-09-27 DIAGNOSIS — Z79899 Other long term (current) drug therapy: Secondary | ICD-10-CM | POA: Insufficient documentation

## 2018-09-27 DIAGNOSIS — Z7982 Long term (current) use of aspirin: Secondary | ICD-10-CM | POA: Insufficient documentation

## 2018-09-27 DIAGNOSIS — Z87891 Personal history of nicotine dependence: Secondary | ICD-10-CM | POA: Insufficient documentation

## 2018-09-27 DIAGNOSIS — E1122 Type 2 diabetes mellitus with diabetic chronic kidney disease: Secondary | ICD-10-CM | POA: Diagnosis not present

## 2018-09-27 DIAGNOSIS — R404 Transient alteration of awareness: Secondary | ICD-10-CM | POA: Diagnosis not present

## 2018-09-27 DIAGNOSIS — N184 Chronic kidney disease, stage 4 (severe): Secondary | ICD-10-CM | POA: Diagnosis not present

## 2018-09-27 DIAGNOSIS — I447 Left bundle-branch block, unspecified: Secondary | ICD-10-CM | POA: Diagnosis not present

## 2018-09-27 DIAGNOSIS — J449 Chronic obstructive pulmonary disease, unspecified: Secondary | ICD-10-CM | POA: Insufficient documentation

## 2018-09-27 DIAGNOSIS — F061 Catatonic disorder due to known physiological condition: Secondary | ICD-10-CM | POA: Diagnosis not present

## 2018-09-27 DIAGNOSIS — I130 Hypertensive heart and chronic kidney disease with heart failure and stage 1 through stage 4 chronic kidney disease, or unspecified chronic kidney disease: Secondary | ICD-10-CM | POA: Insufficient documentation

## 2018-09-27 DIAGNOSIS — E039 Hypothyroidism, unspecified: Secondary | ICD-10-CM | POA: Diagnosis not present

## 2018-09-27 DIAGNOSIS — I5032 Chronic diastolic (congestive) heart failure: Secondary | ICD-10-CM | POA: Insufficient documentation

## 2018-09-27 DIAGNOSIS — R4182 Altered mental status, unspecified: Secondary | ICD-10-CM

## 2018-09-27 DIAGNOSIS — R4020 Unspecified coma: Secondary | ICD-10-CM | POA: Diagnosis not present

## 2018-09-27 LAB — CBC WITH DIFFERENTIAL/PLATELET
Abs Immature Granulocytes: 0.05 10*3/uL (ref 0.00–0.07)
Basophils Absolute: 0 10*3/uL (ref 0.0–0.1)
Basophils Relative: 0 %
Eosinophils Absolute: 0 10*3/uL (ref 0.0–0.5)
Eosinophils Relative: 0 %
HCT: 28.1 % — ABNORMAL LOW (ref 36.0–46.0)
Hemoglobin: 9.2 g/dL — ABNORMAL LOW (ref 12.0–15.0)
Immature Granulocytes: 1 %
Lymphocytes Relative: 7 %
Lymphs Abs: 0.6 10*3/uL — ABNORMAL LOW (ref 0.7–4.0)
MCH: 31.4 pg (ref 26.0–34.0)
MCHC: 32.7 g/dL (ref 30.0–36.0)
MCV: 95.9 fL (ref 80.0–100.0)
Monocytes Absolute: 0.5 10*3/uL (ref 0.1–1.0)
Monocytes Relative: 5 %
Neutro Abs: 7.8 10*3/uL — ABNORMAL HIGH (ref 1.7–7.7)
Neutrophils Relative %: 87 %
Platelets: 277 10*3/uL (ref 150–400)
RBC: 2.93 MIL/uL — ABNORMAL LOW (ref 3.87–5.11)
RDW: 13.4 % (ref 11.5–15.5)
WBC: 9 10*3/uL (ref 4.0–10.5)
nRBC: 0 % (ref 0.0–0.2)

## 2018-09-27 LAB — URINALYSIS, ROUTINE W REFLEX MICROSCOPIC
Bilirubin Urine: NEGATIVE
Glucose, UA: 150 mg/dL — AB
Hgb urine dipstick: NEGATIVE
Ketones, ur: NEGATIVE mg/dL
Leukocytes,Ua: NEGATIVE
Nitrite: NEGATIVE
Protein, ur: >=300 mg/dL — AB
Specific Gravity, Urine: 1.013 (ref 1.005–1.030)
pH: 6 (ref 5.0–8.0)

## 2018-09-27 LAB — COMPREHENSIVE METABOLIC PANEL
ALT: 21 U/L (ref 0–44)
AST: 16 U/L (ref 15–41)
Albumin: 3.2 g/dL — ABNORMAL LOW (ref 3.5–5.0)
Alkaline Phosphatase: 74 U/L (ref 38–126)
Anion gap: 8 (ref 5–15)
BUN: 48 mg/dL — ABNORMAL HIGH (ref 8–23)
CO2: 23 mmol/L (ref 22–32)
Calcium: 8.5 mg/dL — ABNORMAL LOW (ref 8.9–10.3)
Chloride: 108 mmol/L (ref 98–111)
Creatinine, Ser: 2.17 mg/dL — ABNORMAL HIGH (ref 0.44–1.00)
GFR calc Af Amer: 27 mL/min — ABNORMAL LOW (ref >60)
GFR calc non Af Amer: 23 mL/min — ABNORMAL LOW (ref >60)
Glucose, Bld: 199 mg/dL — ABNORMAL HIGH (ref 70–99)
Potassium: 3.8 mmol/L (ref 3.5–5.1)
Sodium: 139 mmol/L (ref 135–145)
Total Bilirubin: 0.8 mg/dL (ref 0.3–1.2)
Total Protein: 6.2 g/dL — ABNORMAL LOW (ref 6.5–8.1)

## 2018-09-27 LAB — CBG MONITORING, ED: Glucose-Capillary: 156 mg/dL — ABNORMAL HIGH (ref 70–99)

## 2018-09-27 LAB — TROPONIN I: Troponin I: <0.03 ng/mL (ref <0.03)

## 2018-09-27 MED ORDER — HALOPERIDOL LACTATE 5 MG/ML IJ SOLN: 2.0000 mg | Freq: Once | INTRAMUSCULAR | Status: DC

## 2018-09-27 MED ORDER — GABAPENTIN 300 MG PO CAPS
300.0000 mg | ORAL_CAPSULE | Freq: Two times a day (BID) | ORAL | Status: DC
  Administered 2018-09-27: 300 mg via ORAL
  Filled 2018-09-27: qty 1

## 2018-09-27 NOTE — ED Provider Notes (Signed)
MSE was initiated and I personally evaluated the patient and placed orders (if any) at  6:04 AM on September 27, 2018.  The patient appears stable so that the remainder of the MSE may be completed by another provider.  Per EMS patient was brought from her nursing facility, they states she was talking last night however this morning they found her sitting in a chair slumped over to the side.  She is not verbally responsive.  They report her CBG was 224.  Patient is nonverbal, she has her eyes wide open and does not blink.  Her pupils are equal and reactive to light.  When I asked her to open her mouth she does open her mouth.  Her tongue is dry.  Cardiovascular S1-S2 is normal lungs lungs are clear without wheezes rhonchi or rales.  Skin is warm and dry without rash or lesions.  On neuro exam when I hold up her arm and asked her to keep her arm up she lets it fall to the stretcher.  However if I hold her arm over her face she holds in the air and does not hit her face.  She does not respond verbally.   EKG Interpretation  Date/Time:  Wednesday September 27 2018 06:10:51 EST Ventricular Rate:  89 PR Interval:    QRS Duration: 165 QT Interval:  422 QTC Calculation: 514 R Axis:   -49 Text Interpretation:  Sinus rhythm Multiple ventricular premature complexes RBBB and LAFB Left ventricular hypertrophy No significant change since last tracing 18 Sep 2018 Confirmed by Rolland Porter [phone]) on 09/27/2018 6:16:13 AM       Patient was admitted to the hospital and actually just discharged on February 4.  She had been in the ED for having hallucinations and then became nonresponsive and was admitted for encephalopathy and found to have enterococcus UTI.  She is also had several other visits earlier in January where she becomes catatonic and then she would resolve.  Patient has a history of schizophrenia.  Laboratory testing was done including cath urinalysis.  CT is not available this morning so MR of the brain  was done to look for acute intracranial event.  Her MRI on January 31 showed atrophy and small vessel disease without acute findings.  She had a EEG done on January 31 which was normal both awake and asleep.  There was no focal lateralizing or epileptiform features seen.  Rolland Porter, MD, Barbette Or, MD 09/27/18 [phone]

## 2018-09-27 NOTE — ED Notes (Signed)
Pt is alert and oriented x4. States she went to bed and woke up in the ED. Is confused why she's here.

## 2018-09-27 NOTE — ED Triage Notes (Signed)
Pt brought in by [facility] for c/o "catatonic state" group home reports to ems that pt was found sitting up in chair in her room not speaking, only staring straight ahead;  cbg 224

## 2018-09-27 NOTE — ED Notes (Signed)
Pt eating breakfast. Have given report to Nursing Home

## 2018-09-27 NOTE — ED Notes (Signed)
Pt will not answer questions or follow commands when requested. Pt is not speaking at this time.

## 2018-09-27 NOTE — ED Notes (Signed)
EDP at bedside doing eval. PT opens up her eyes and makes eye contact at this time and asked why she was at the hospital. PT alert and oriented and waiting on MRI to be performed.

## 2018-09-27 NOTE — ED Notes (Signed)
Upon nurse entering room - pt was coughing and moved hand, also blinked eyes, as nurse entered towards pt bed, pt started staring straight ahead again. Pt has positive doll's eye reflex. Legrand Como, RN witnessed these events and Dr Tomi Bamberger made aware.

## 2018-09-27 NOTE — Discharge Instructions (Addendum)
Patient now back to baseline.  MRI brain without any acute findings.  Probably had a catatonic reaction which resolved spontaneously.  Patient stable for discharge back to nursing facility.  No evidence of urinary tract infection.  Patient is drinking fluids here fine and conversing fine.

## 2018-09-27 NOTE — ED Provider Notes (Signed)
[facility] Provider Note   CSN: [id number] Arrival date & time: 09/27/18  0602     History   Chief Complaint Chief Complaint  Patient presents with  . Altered Mental Status    HPI Romeka Scifres is a 68 y.o. female.  Reviewed Dr. Johnsie Kindred medical screening note.  Time that I came in and saw patient she was talking so had come out of the what appeared to be a catatonic state.  Patient continues talk fine wondered why she was here and also able to drink fluids.  Patient without any specific complaint when I saw her.     Past Medical History:  Diagnosis Date  . Anxiety   . Asthma   . Back pain   . Barrett esophagus   . CHF (congestive heart failure) ([place])   . CKD (chronic kidney disease) stage 3, GFR 30-59 ml/min (HCC)   . COPD (chronic obstructive pulmonary disease) ([place])   . Depression   . Diastolic heart failure ([place])   . Essential hypertension   . Fibromyalgia   . GERD (gastroesophageal reflux disease)   . Gout   . History of pericarditis   . Hyperlipidemia   . Hypothyroid   . Major depressive disorder   . Memory loss   . Nephrolithiasis    2008  . Obesity hypoventilation syndrome (HCC)    CPAP  . Obstructive sleep apnea   . PE (pulmonary embolism)    2010  . Peripheral neuropathy   . PTSD (post-traumatic stress disorder)   . Rheumatoid arthritis ([place])   . Schizophrenia ([place])   . Steroid dependence   . Type 2 diabetes mellitus ([place])   . Vitamin D deficiency     Patient Active Problem List   Diagnosis Date Noted  . Pressure injury of skin 09/17/2018  . Respiratory failure ([place]) 08/16/2018  . Seizure ([place]) 08/16/2018  . Acute encephalopathy 07/27/2018  . Endotracheally intubated 07/27/2018  . Hypothyroidism 07/27/2018  . Pleuritic chest pain 07/14/2018  . Moderate persistent asthma without complication   . Type 2 diabetes mellitus ([place]) 04/19/2018  . Acute kidney injury superimposed on chronic kidney disease ([place]) 01/13/2018   . Altered mental status   . Encephalopathy 10/11/2016  . Chest pain 08/12/2016  . CKD (chronic kidney disease), stage IV ([place]) 08/12/2016  . History of pulmonary embolus (PE) 08/12/2016  . Obesity, Class III, BMI 40-49.9 (morbid obesity) ([place]) 08/12/2016  . RBBB 08/12/2016  . Positive D dimer 08/12/2016  . Cerebral infarction ([place]) 11/10/2015  . Physical deconditioning 07/01/2011  . Weakness 07/01/2011  . Diabetic foot ulcer ([place]) 05/19/2011  . Polypharmacy 02/09/2011  . BACK PAIN WITH RADICULOPATHY 05/25/2010  . Type 2 diabetes mellitus with diabetic polyneuropathy, without long-term current use of insulin ([place]) 08/18/2009  . Chronic diastolic heart failure ([place]) 04/25/2009  . Normocytic anemia 04/14/2009  . Sleep apnea 12/11/2008  . Diabetic peripheral neuropathy ([place]) 05/04/2007  . HYPERCHOLESTEROLEMIA 10/13/2006  . Gout, unspecified 10/13/2006  . HYPOKALEMIA 10/13/2006  . Schizophrenia ([place]) 10/13/2006  . Depression 10/13/2006  . Essential hypertension 10/13/2006  . Venous (peripheral) insufficiency 10/13/2006  . RHINITIS, ALLERGIC 10/13/2006  . Asthma 10/13/2006  . Reflux esophagitis 10/13/2006  . OSTEOARTHRITIS, MULTI SITES 10/13/2006  . INCONTINENCE, URGE 10/13/2006    Past Surgical History:  Procedure Laterality Date  . APPENDECTOMY    . CHOLECYSTECTOMY    . COLONOSCOPY     benign polyps (12/2000), repeat colonoscopy performed in 2007  . ENDOMETRIAL BIOPSY  10/03 benign columnar mucosa (limited material)  . KIDNEY SURGERY       OB History   No obstetric history on file.      Home Medications    Prior to Admission medications   Medication Sig Start Date End Date Taking? Authorizing Provider  acetaminophen (TYLENOL) 325 MG tablet Take 325 mg by mouth 2 (two) times daily.     [provider]  albuterol (PROVENTIL) (2.5 MG/3ML) 0.083% nebulizer solution Take 2.5 mg by nebulization every 6 (six) hours as needed for wheezing.     [provider]  albuterol (VENTOLIN HFA) 108 (90 Base) MCG/ACT inhaler Inhale 1-2 puffs into the lungs every 6 (six) hours as needed for wheezing or shortness of breath.    [provider]  amLODipine (NORVASC) 5 MG tablet Take 1 tablet (5 mg total) by mouth daily. 07/31/18 07/31/19  GhimireHenreitta Leber, MD  aspirin 81 MG tablet Take 81 mg by mouth daily.    [provider]  atorvastatin (LIPITOR) 40 MG tablet Take 40 mg by mouth at bedtime.    [provider]  carvedilol (COREG) 6.25 MG tablet Take 1 tablet (6.25 mg total) by mouth 2 (two) times daily with a meal. 08/18/18   Lavina Hamman, MD  Cholecalciferol (VITAMIN D3) 50 MCG (2000 UT) TABS Take 4,000 Units by mouth daily.    [provider]  docusate sodium (COLACE) 100 MG capsule Take 100 mg by mouth daily.     [provider]  Dulaglutide (TRULICITY) 1.5 LZ/7.6BH SOPN Inject 1.5 mg into the skin once a week.    [provider]  EPINEPHrine (EPI-PEN) 0.3 mg/0.3 mL DEVI Inject 0.3 mg into the muscle once as needed (for bee stings, then go to the ED immediately after using).     [provider]  gabapentin (NEURONTIN) 300 MG capsule Take 300 mg by mouth 2 (two) times daily.  08/28/18   [provider]  glipiZIDE (GLUCOTROL XL) 5 MG 24 hr tablet Take 5 mg by mouth 2 (two) times daily.     [provider]  Glycerin, Laxative, (FLEET LIQUID GLYCERIN SUPP RE) Place 1 suppository rectally daily as needed (for constipation).    [provider]  haloperidol (HALDOL) 0.5 MG tablet Take 1 tablet (0.5 mg total) by mouth 2 (two) times daily. 07/31/18   Ghimire, Henreitta Leber, MD  hydrocortisone 2.5 % ointment Apply 1 application topically daily as needed (itchy skin).    [provider]  insulin degludec (TRESIBA FLEXTOUCH) 100 UNIT/ML SOPN FlexTouch Pen Inject 20 Units into the skin at bedtime.    [provider]  levETIRAcetam (KEPPRA) 500 MG tablet Take  1 tablet (500 mg total) by mouth 2 (two) times daily. 08/18/18   Lavina Hamman, MD  levothyroxine (SYNTHROID, LEVOTHROID) 25 MCG tablet Take 1 tablet (25 mcg total) by mouth daily at 6 (six) AM. 08/19/18   Lavina Hamman, MD  linagliptin (TRADJENTA) 5 MG TABS tablet Take 5 mg by mouth daily.    [provider]  loperamide (IMODIUM) 2 MG capsule Take 2 mg by mouth 3 (three) times daily as needed (for diarrhea).     [provider]  montelukast (SINGULAIR) 10 MG tablet Take 10 mg by mouth at bedtime.    [provider]  Multiple Vitamin (DAILY VITE PO) Take 1 tablet by mouth daily.    [provider]  nystatin (MYCOSTATIN) 100000 UNIT/ML suspension Take 5 mLs by  mouth 3 (three) times daily. 10 day course starting on 09/06/2018    [provider]  omeprazole (PRILOSEC) 20 MG capsule Take 20 mg by mouth daily.     [provider]  OVER THE COUNTER MEDICATION CPAP    [provider]  phenazopyridine (PYRIDIUM) 100 MG tablet Take 200 mg by mouth 3 (three) times daily as needed for pain.    [provider]  polyethylene glycol powder (GLYCOLAX/MIRALAX) powder Take 17 g by mouth daily as needed (for constipation).    [provider]  pyridOXINE (VITAMIN B-6) 100 MG tablet Take 200 mg by mouth 2 (two) times daily.    [provider]  risperiDONE (RISPERDAL) 0.5 MG tablet Take 1 tablet (0.5 mg total) by mouth 2 (two) times daily for 30 days. Please give 0.5mg  in am and 1mg  at bedtime. 09/19/18 10/19/18  Manuella Ghazi, Pratik D, DO  spironolactone (ALDACTONE) 25 MG tablet Take 1 tablet (25 mg total) by mouth daily. 04/12/18   Mesner, Corene Cornea, MD  torsemide (DEMADEX) 20 MG tablet Take 60 mg alternating with 40 mg daily. Patient taking differently: Take 40-60 mg by mouth See admin instructions. 60 mg by mouth every other day: alternating with 40 mg by mouth every other day 07/26/18   Erma Heritage, PA-C    Family History Family  History  Problem Relation Age of Onset  . Bone cancer Mother   . Hypertension Mother   . Heart disease Mother   . COPD Father   . Heart disease Father   . Stroke Father     Social History Social History   Tobacco Use  . Smoking status: Former Smoker    Types: Cigarettes    Last attempt to quit: 06/23/1972    Years since quitting: 46.2  . Smokeless tobacco: Never Used  Substance Use Topics  . Alcohol use: No  . Drug use: No     Allergies   Benztropine mesylate; Codeine; Diazepam; Diphenhydramine hcl; Penicillins; Sulfonamide derivatives; Thioridazine hcl; Benztropine; Penicillin g; Lisinopril; and Sulfamethoxazole   Review of Systems Review of Systems  Constitutional: Negative for chills and fever.  HENT: Negative for congestion, rhinorrhea and sore throat.   Eyes: Negative for visual disturbance.  Respiratory: Negative for cough and shortness of breath.   Cardiovascular: Negative for chest pain and leg swelling.  Gastrointestinal: Negative for abdominal pain, diarrhea, nausea and vomiting.  Genitourinary: Negative for dysuria.  Musculoskeletal: Negative for back pain and neck pain.  Skin: Negative for rash.  Neurological: Negative for dizziness, light-headedness and headaches.  Hematological: Does not bruise/bleed easily.  Psychiatric/Behavioral: Negative for confusion.     Physical Exam Updated Vital Signs BP (!) 162/83   Pulse 86   Temp 98.3 F (36.8 C) (Axillary)   Resp 13   Ht 1.6 m (5\' 3" )   Wt 96.5 kg   SpO2 96%   BMI 37.69 kg/m   Physical Exam Vitals signs and nursing note reviewed.  Constitutional:      General: She is not in acute distress.    Appearance: She is well-developed.  HENT:     Head: Normocephalic and atraumatic.     Nose: No congestion.  Eyes:     Extraocular Movements: Extraocular movements intact.     Conjunctiva/sclera: Conjunctivae normal.     Pupils: Pupils are equal, round, and reactive to light.  Neck:      Musculoskeletal: Normal range of motion and neck supple.  Cardiovascular:     Rate and  Rhythm: Normal rate and regular rhythm.     Heart sounds: No murmur.  Pulmonary:     Effort: Pulmonary effort is normal. No respiratory distress.     Breath sounds: Normal breath sounds.  Abdominal:     General: Bowel sounds are normal.     Palpations: Abdomen is soft.     Tenderness: There is no abdominal tenderness.  Skin:    General: Skin is warm and dry.  Neurological:     General: No focal deficit present.     Mental Status: She is alert. Mental status is at baseline.      ED Treatments / Results  Labs (all labs ordered are listed, but only abnormal results are displayed) Labs Reviewed  COMPREHENSIVE METABOLIC PANEL - Abnormal; Notable for the following components:      Result Value   Glucose, Bld 199 (*)    BUN 48 (*)    Creatinine, Ser 2.17 (*)    Calcium 8.5 (*)    Total Protein 6.2 (*)    Albumin 3.2 (*)    GFR calc non Af Amer 23 (*)    GFR calc Af Amer 27 (*)    All other components within normal limits  CBC WITH DIFFERENTIAL/PLATELET - Abnormal; Notable for the following components:   RBC 2.93 (*)    Hemoglobin 9.2 (*)    HCT 28.1 (*)    Neutro Abs 7.8 (*)    Lymphs Abs 0.6 (*)    All other components within normal limits  URINALYSIS, ROUTINE W REFLEX MICROSCOPIC - Abnormal; Notable for the following components:   Glucose, UA 150 (*)    Protein, ur >=300 (*)    Bacteria, UA RARE (*)    All other components within normal limits  CBG MONITORING, ED - Abnormal; Notable for the following components:   Glucose-Capillary 156 (*)    All other components within normal limits  TROPONIN I    EKG EKG Interpretation  Date/Time:  Wednesday September 27 2018 06:10:51 EST Ventricular Rate:  89 PR Interval:    QRS Duration: 165 QT Interval:  422 QTC Calculation: 514 R Axis:   -49 Text Interpretation:  Sinus rhythm Multiple ventricular premature complexes RBBB and LAFB Left  ventricular hypertrophy No significant change since last tracing 18 Sep 2018 Confirmed by Rolland Porter [phone]) on 09/27/2018 6:16:13 AM   Radiology Mr Brain Wo Contrast  Result Date: 09/27/2018 CLINICAL DATA:  Altered level of consciousness. EXAM: MRI HEAD WITHOUT CONTRAST TECHNIQUE: Multiplanar, multiecho pulse sequences of the brain and surrounding structures were obtained without intravenous contrast. COMPARISON:  09/15/2018 FINDINGS: Some sequences are mildly to moderately motion degraded. Brain: There is no evidence of acute infarct, intracranial hemorrhage, mass, midline shift, or extra-axial fluid collection. Mild cerebral atrophy is again noted. Scattered small foci of T2 hyperintensity in the cerebral white matter are unchanged and nonspecific but compatible with mild chronic small vessel ischemic disease. A small chronic infarct in the medial right cerebellar hemisphere is unchanged. Vascular: Major intracranial vascular flow voids are preserved. Skull and upper cervical spine: Unremarkable bone marrow signal. Sinuses/Orbits: Bilateral cataract extraction. Mild mucosal thickening in the paranasal sinuses. Small left maxillary sinus mucous retention cyst. Trace bilateral mastoid fluid. Other: None. IMPRESSION: 1. No acute intracranial abnormality. 2. Mild chronic small vessel ischemic disease and cerebral atrophy. Electronically Signed   By: Logan Bores M.D.   On: 09/27/2018 08:58    Procedures Procedures (including critical care time)  Medications Ordered in ED Medications  haloperidol lactate (HALDOL) injection 2 mg (0 mg Intravenous Hold 09/27/18 0640)  gabapentin (NEURONTIN) capsule 300 mg (300 mg Oral Given 09/27/18 9937)     Initial Impression / Assessment and Plan / ED Course  I have reviewed the triage vital signs and the nursing notes.  Pertinent labs & imaging results that were available during my care of the patient were reviewed by me and considered in my medical decision  making (see chart for details).     Patient's work-up labs without significant abnormalities urinalysis negative for urinary tract infection.  Patient's had a history of catatonic response in the past.  MRI brain without any acute findings.  Patient now seems to be back to baseline.  Patient stable to return to nursing facility.  Final Clinical Impressions(s) / ED Diagnoses   Final diagnoses:  Altered mental status, unspecified altered mental status type  Catatonic reaction    ED Discharge Orders    None       Fredia Sorrow, MD 09/27/18 1002

## 2018-09-28 DIAGNOSIS — I5032 Chronic diastolic (congestive) heart failure: Secondary | ICD-10-CM | POA: Diagnosis not present

## 2018-09-28 DIAGNOSIS — E1122 Type 2 diabetes mellitus with diabetic chronic kidney disease: Secondary | ICD-10-CM | POA: Diagnosis not present

## 2018-09-28 DIAGNOSIS — D631 Anemia in chronic kidney disease: Secondary | ICD-10-CM | POA: Diagnosis not present

## 2018-09-28 DIAGNOSIS — G9340 Encephalopathy, unspecified: Secondary | ICD-10-CM | POA: Diagnosis not present

## 2018-09-28 DIAGNOSIS — N184 Chronic kidney disease, stage 4 (severe): Secondary | ICD-10-CM | POA: Diagnosis not present

## 2018-09-28 DIAGNOSIS — I130 Hypertensive heart and chronic kidney disease with heart failure and stage 1 through stage 4 chronic kidney disease, or unspecified chronic kidney disease: Secondary | ICD-10-CM | POA: Diagnosis not present

## 2018-10-01 IMAGING — CT CT HEAD W/O CM
3 series · 16 of 47 positions shown, 19 images · non-contrast
Comparison: 04/12/2018

CLINICAL DATA: Unresponsive this evening.

EXAM:
CT HEAD WITHOUT CONTRAST
TECHNIQUE: Contiguous axial images were obtained from the base of the skull
through the vertex without intravenous contrast.

[Series 2: head trauma wo · axial · 0.42mm/px · z∈[+381,+506]mm · 10 of 30 slices shown, 13 images]
[im 3/30  brain]
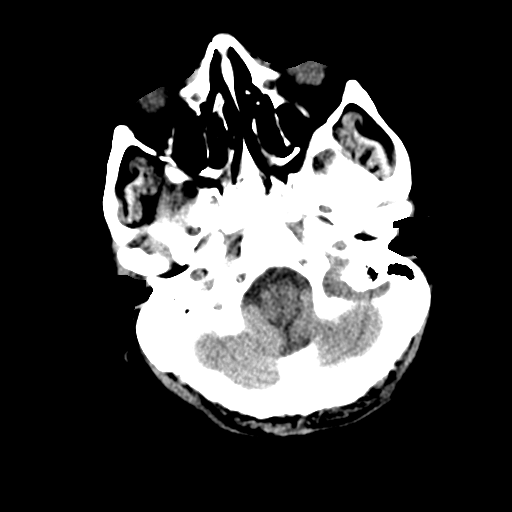
[im 3/30  bone]
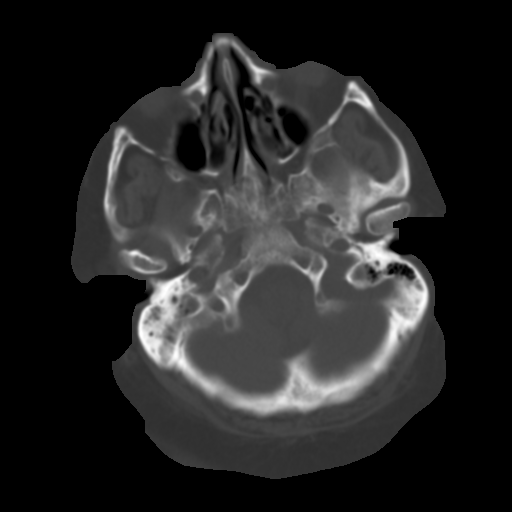
[im 6/30  brain]
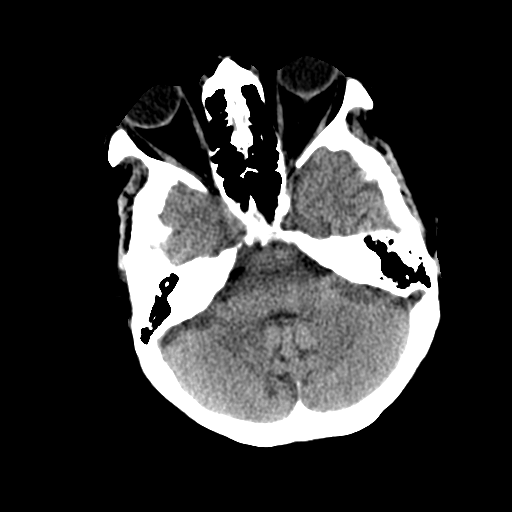
[im 9/30  brain]
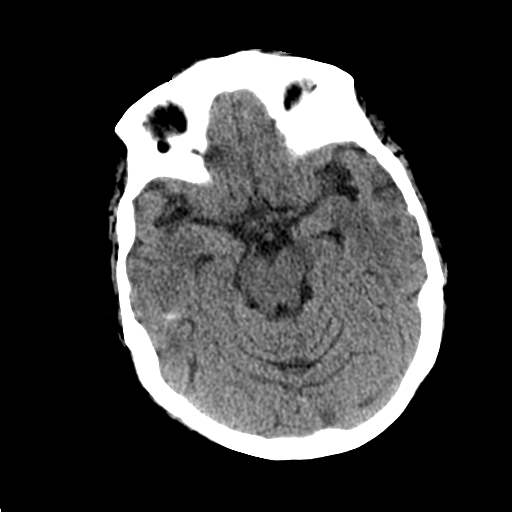
[im 11/30  brain]
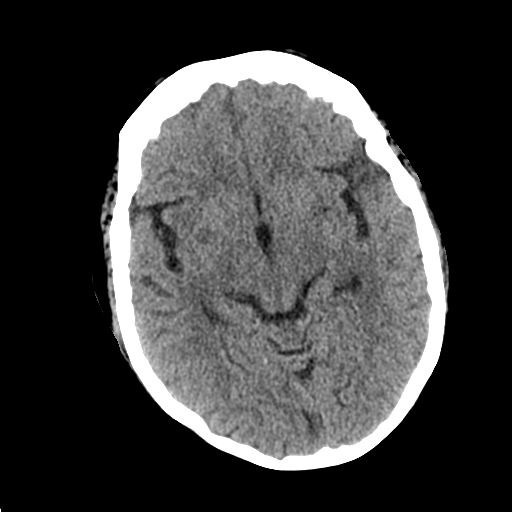
[im 14/30  brain]
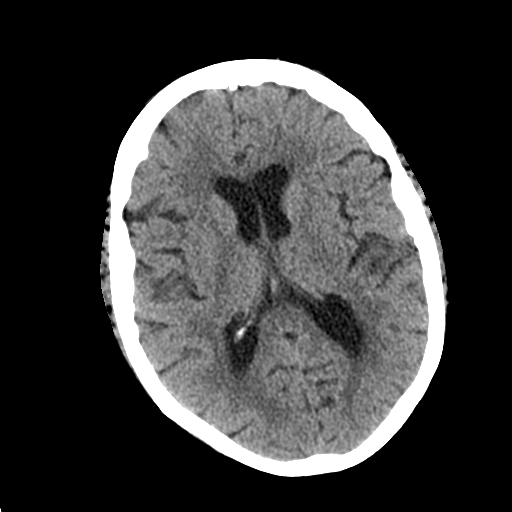
[im 14/30  bone]
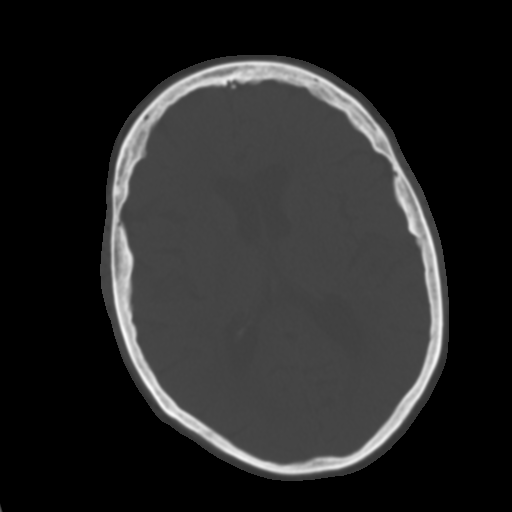
[im 17/30  brain]
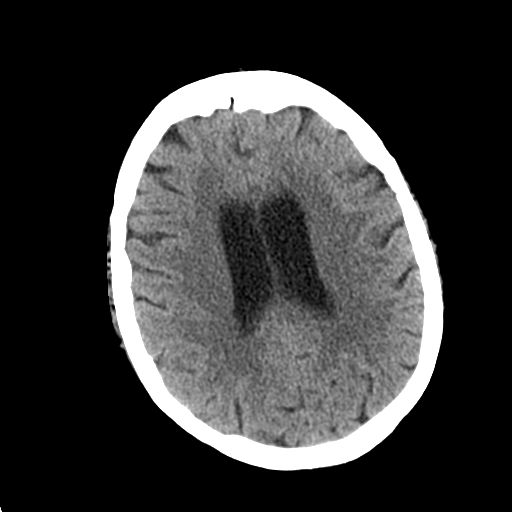
[im 20/30  brain]
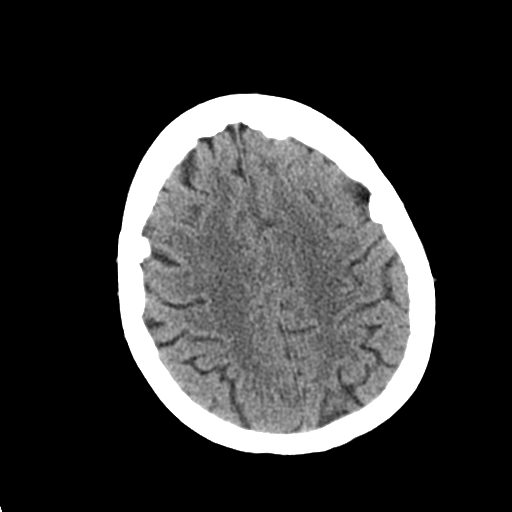
[im 23/30  brain]
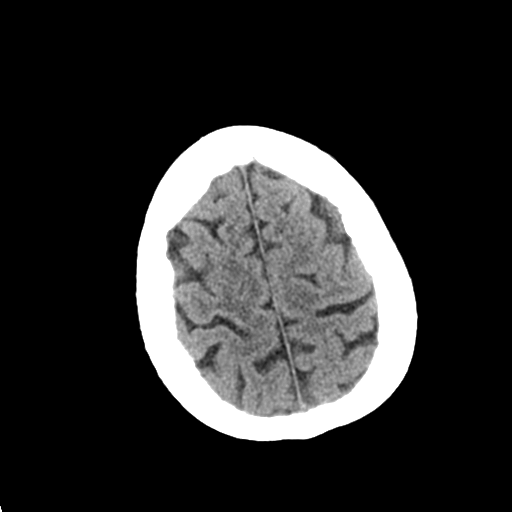
[im 25/30  brain]
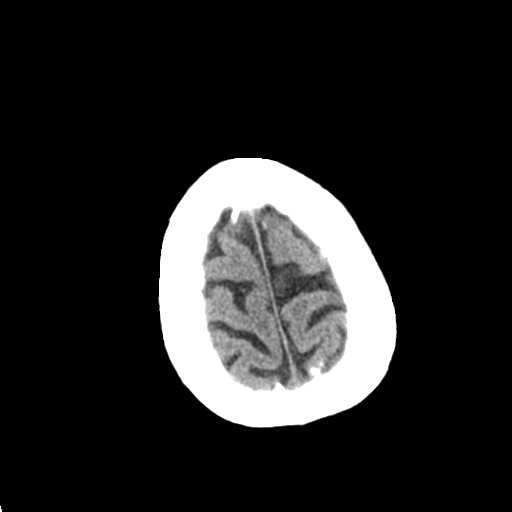
[im 25/30  bone]
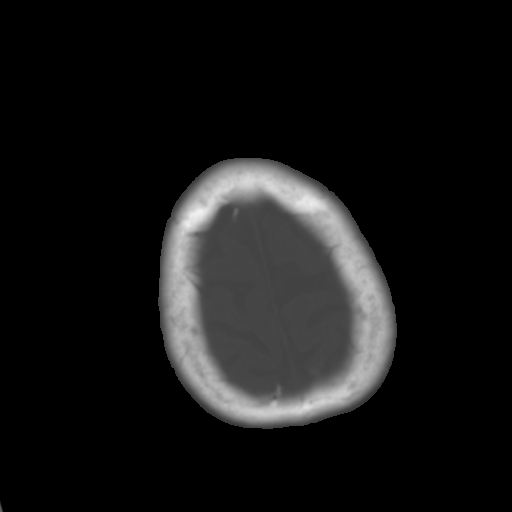
[im 28/30  brain]
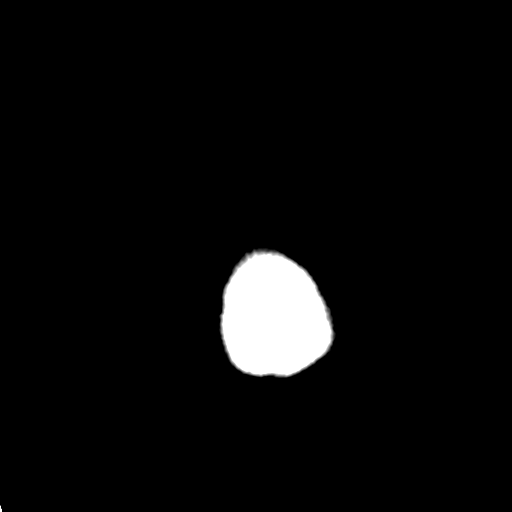

[Series 4: coronal soft tissue · coronal · 0.30mm/px · 3 of 62 slices shown]
[im 21/62  brain]
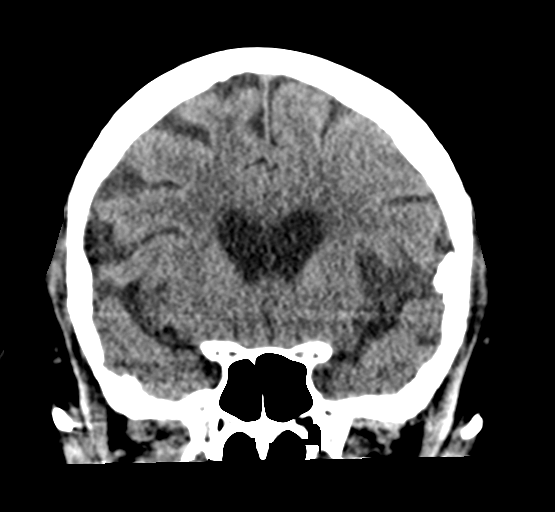
[im 28/62  brain]
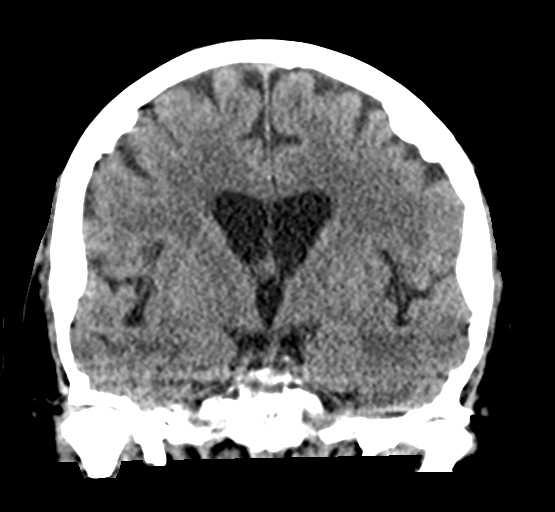
[im 34/62  brain]
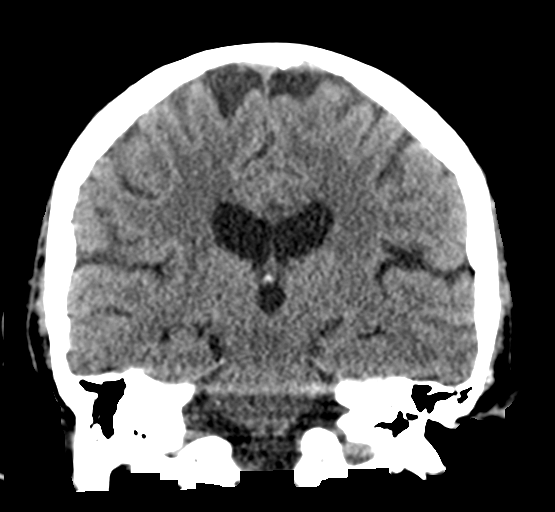

[Series 5: sagittal soft tissue · sagittal · 0.34mm/px · 3 of 53 slices shown]
[im 18/53  brain]
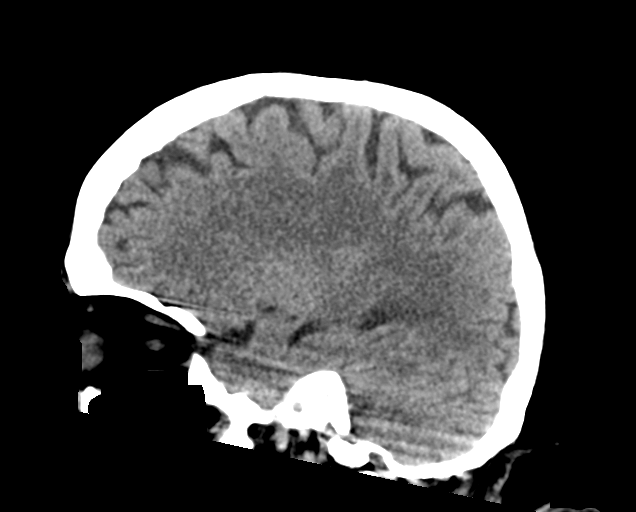
[im 27/53  brain]
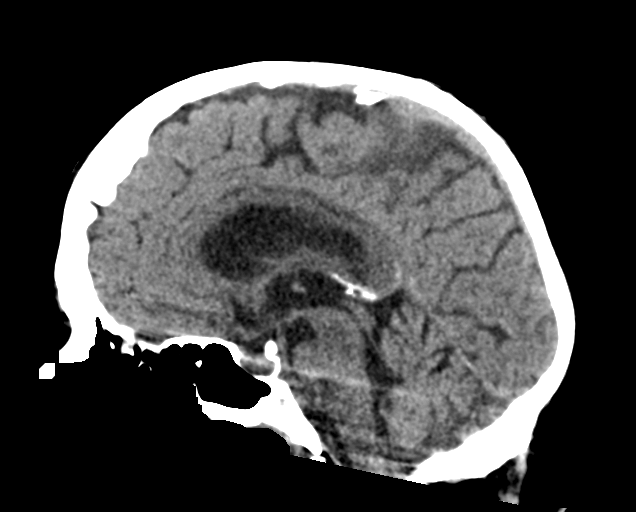
[im 35/53  brain]
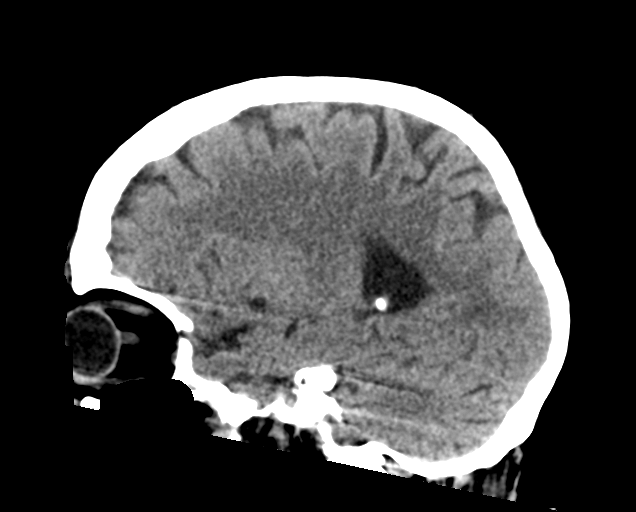

[16 of 47 positions shown; findings below may reference images not displayed]

FINDINGS: Brain: No evidence of acute infarction, hemorrhage, hydrocephalus,
extra-axial collection or mass lesion/mass effect.

Vascular: No hyperdense vessel or unexpected calcification.

Skull: Normal. Negative for fracture or focal lesion.

Sinuses/Orbits: No acute finding.

Other: None.
IMPRESSION: Negative head CT.

## 2018-10-01 IMAGING — CR DG CHEST 1V PORT
1 series · 1 of 1 positions shown · non-contrast
Comparison: 03/24/2018, 01/12/2018

CLINICAL DATA: Altered mental status

EXAM:
PORTABLE CHEST 1 VIEW

[ap]
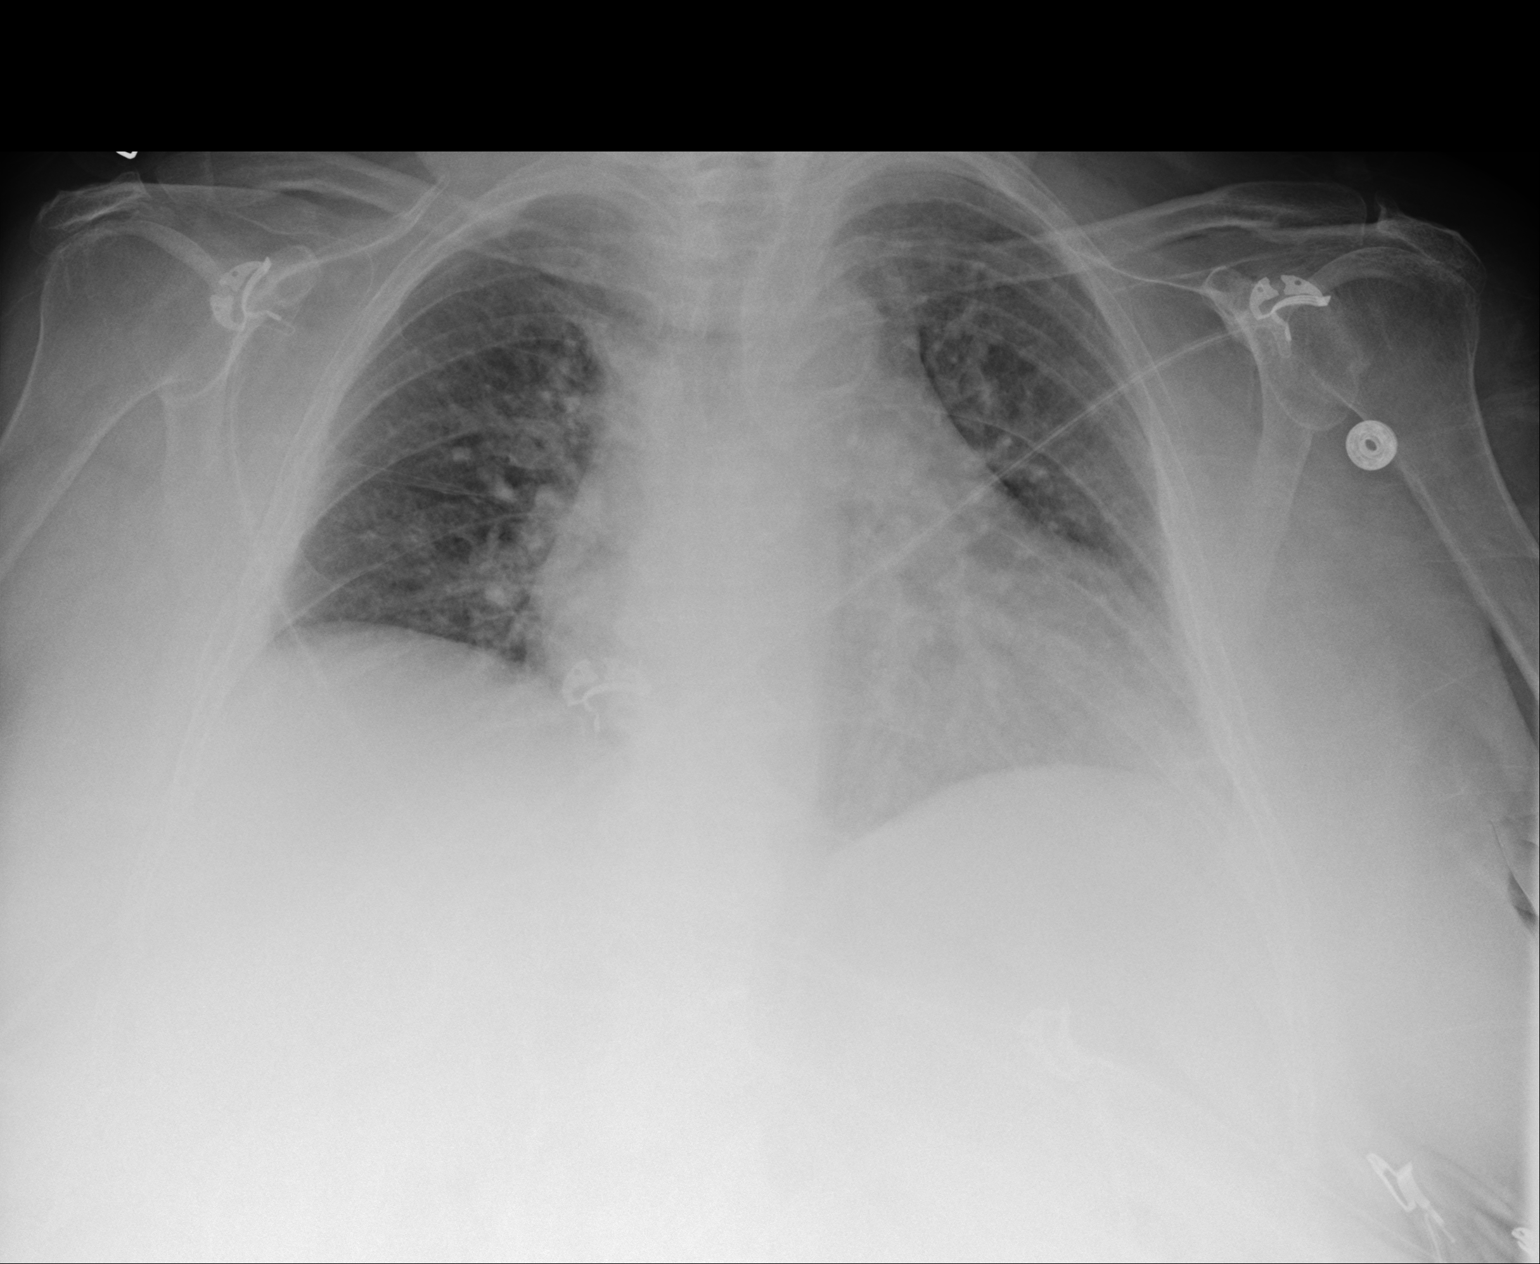

[1 of 1 positions shown; findings below may reference images not displayed]

FINDINGS: Cardiomegaly with mild central congestion. Low lung volumes. No
focal consolidation or pleural effusion. No pneumothorax.
IMPRESSION: Cardiomegaly with mild central vascular congestion

## 2018-10-02 DIAGNOSIS — Z6841 Body Mass Index (BMI) 40.0 and over, adult: Secondary | ICD-10-CM | POA: Diagnosis not present

## 2018-10-02 DIAGNOSIS — I10 Essential (primary) hypertension: Secondary | ICD-10-CM | POA: Diagnosis not present

## 2018-10-02 DIAGNOSIS — E1142 Type 2 diabetes mellitus with diabetic polyneuropathy: Secondary | ICD-10-CM | POA: Diagnosis not present

## 2018-10-02 DIAGNOSIS — M6281 Muscle weakness (generalized): Secondary | ICD-10-CM | POA: Diagnosis not present

## 2018-10-02 DIAGNOSIS — Z6838 Body mass index (BMI) 38.0-38.9, adult: Secondary | ICD-10-CM | POA: Diagnosis not present

## 2018-10-02 DIAGNOSIS — R2689 Other abnormalities of gait and mobility: Secondary | ICD-10-CM | POA: Diagnosis not present

## 2018-10-02 DIAGNOSIS — Z8673 Personal history of transient ischemic attack (TIA), and cerebral infarction without residual deficits: Secondary | ICD-10-CM | POA: Diagnosis not present

## 2018-10-02 DIAGNOSIS — F061 Catatonic disorder due to known physiological condition: Secondary | ICD-10-CM | POA: Diagnosis not present

## 2018-10-02 DIAGNOSIS — D631 Anemia in chronic kidney disease: Secondary | ICD-10-CM | POA: Diagnosis not present

## 2018-10-02 DIAGNOSIS — G9340 Encephalopathy, unspecified: Secondary | ICD-10-CM | POA: Diagnosis not present

## 2018-10-02 DIAGNOSIS — N184 Chronic kidney disease, stage 4 (severe): Secondary | ICD-10-CM | POA: Diagnosis not present

## 2018-10-02 DIAGNOSIS — I872 Venous insufficiency (chronic) (peripheral): Secondary | ICD-10-CM | POA: Diagnosis not present

## 2018-10-02 DIAGNOSIS — Z86711 Personal history of pulmonary embolism: Secondary | ICD-10-CM | POA: Diagnosis not present

## 2018-10-02 DIAGNOSIS — M150 Primary generalized (osteo)arthritis: Secondary | ICD-10-CM | POA: Diagnosis not present

## 2018-10-02 DIAGNOSIS — G4730 Sleep apnea, unspecified: Secondary | ICD-10-CM | POA: Diagnosis not present

## 2018-10-02 DIAGNOSIS — Z794 Long term (current) use of insulin: Secondary | ICD-10-CM | POA: Diagnosis not present

## 2018-10-02 DIAGNOSIS — E669 Obesity, unspecified: Secondary | ICD-10-CM | POA: Diagnosis not present

## 2018-10-02 DIAGNOSIS — I130 Hypertensive heart and chronic kidney disease with heart failure and stage 1 through stage 4 chronic kidney disease, or unspecified chronic kidney disease: Secondary | ICD-10-CM | POA: Diagnosis not present

## 2018-10-02 DIAGNOSIS — E1122 Type 2 diabetes mellitus with diabetic chronic kidney disease: Secondary | ICD-10-CM | POA: Diagnosis not present

## 2018-10-02 DIAGNOSIS — Z299 Encounter for prophylactic measures, unspecified: Secondary | ICD-10-CM | POA: Diagnosis not present

## 2018-10-02 DIAGNOSIS — I509 Heart failure, unspecified: Secondary | ICD-10-CM | POA: Diagnosis not present

## 2018-10-02 DIAGNOSIS — E1165 Type 2 diabetes mellitus with hyperglycemia: Secondary | ICD-10-CM | POA: Diagnosis not present

## 2018-10-02 DIAGNOSIS — J4540 Moderate persistent asthma, uncomplicated: Secondary | ICD-10-CM | POA: Diagnosis not present

## 2018-10-02 DIAGNOSIS — I5032 Chronic diastolic (congestive) heart failure: Secondary | ICD-10-CM | POA: Diagnosis not present

## 2018-10-02 DIAGNOSIS — F209 Schizophrenia, unspecified: Secondary | ICD-10-CM | POA: Diagnosis not present

## 2018-10-02 DIAGNOSIS — Z8631 Personal history of diabetic foot ulcer: Secondary | ICD-10-CM | POA: Diagnosis not present

## 2018-10-03 DIAGNOSIS — I130 Hypertensive heart and chronic kidney disease with heart failure and stage 1 through stage 4 chronic kidney disease, or unspecified chronic kidney disease: Secondary | ICD-10-CM | POA: Diagnosis not present

## 2018-10-03 DIAGNOSIS — D631 Anemia in chronic kidney disease: Secondary | ICD-10-CM | POA: Diagnosis not present

## 2018-10-03 DIAGNOSIS — I5032 Chronic diastolic (congestive) heart failure: Secondary | ICD-10-CM | POA: Diagnosis not present

## 2018-10-03 DIAGNOSIS — G9340 Encephalopathy, unspecified: Secondary | ICD-10-CM | POA: Diagnosis not present

## 2018-10-03 DIAGNOSIS — E1122 Type 2 diabetes mellitus with diabetic chronic kidney disease: Secondary | ICD-10-CM | POA: Diagnosis not present

## 2018-10-03 DIAGNOSIS — N184 Chronic kidney disease, stage 4 (severe): Secondary | ICD-10-CM | POA: Diagnosis not present

## 2018-10-04 DIAGNOSIS — N184 Chronic kidney disease, stage 4 (severe): Secondary | ICD-10-CM | POA: Diagnosis not present

## 2018-10-04 DIAGNOSIS — D631 Anemia in chronic kidney disease: Secondary | ICD-10-CM | POA: Diagnosis not present

## 2018-10-04 DIAGNOSIS — I130 Hypertensive heart and chronic kidney disease with heart failure and stage 1 through stage 4 chronic kidney disease, or unspecified chronic kidney disease: Secondary | ICD-10-CM | POA: Diagnosis not present

## 2018-10-04 DIAGNOSIS — I5032 Chronic diastolic (congestive) heart failure: Secondary | ICD-10-CM | POA: Diagnosis not present

## 2018-10-04 DIAGNOSIS — E1122 Type 2 diabetes mellitus with diabetic chronic kidney disease: Secondary | ICD-10-CM | POA: Diagnosis not present

## 2018-10-04 DIAGNOSIS — G9340 Encephalopathy, unspecified: Secondary | ICD-10-CM | POA: Diagnosis not present

## 2018-10-06 DIAGNOSIS — G9340 Encephalopathy, unspecified: Secondary | ICD-10-CM | POA: Diagnosis not present

## 2018-10-06 DIAGNOSIS — E1122 Type 2 diabetes mellitus with diabetic chronic kidney disease: Secondary | ICD-10-CM | POA: Diagnosis not present

## 2018-10-06 DIAGNOSIS — I130 Hypertensive heart and chronic kidney disease with heart failure and stage 1 through stage 4 chronic kidney disease, or unspecified chronic kidney disease: Secondary | ICD-10-CM | POA: Diagnosis not present

## 2018-10-06 DIAGNOSIS — I5032 Chronic diastolic (congestive) heart failure: Secondary | ICD-10-CM | POA: Diagnosis not present

## 2018-10-06 DIAGNOSIS — N184 Chronic kidney disease, stage 4 (severe): Secondary | ICD-10-CM | POA: Diagnosis not present

## 2018-10-06 DIAGNOSIS — D631 Anemia in chronic kidney disease: Secondary | ICD-10-CM | POA: Diagnosis not present

## 2018-10-09 ENCOUNTER — Other Ambulatory Visit: Payer: Self-pay

## 2018-10-09 ENCOUNTER — Emergency Department (HOSPITAL_COMMUNITY)
Admission: EM | Admit: 2018-10-09 | Discharge: 2018-10-10 | Disposition: A | Discharge status: Skilled Nursing Facility | Payer: Medicare Other | Attending: Emergency Medicine | Admitting: Emergency Medicine

## 2018-10-09 ENCOUNTER — Encounter (HOSPITAL_COMMUNITY): Payer: Self-pay | Admitting: Emergency Medicine

## 2018-10-09 DIAGNOSIS — E1140 Type 2 diabetes mellitus with diabetic neuropathy, unspecified: Secondary | ICD-10-CM | POA: Diagnosis not present

## 2018-10-09 DIAGNOSIS — Z87891 Personal history of nicotine dependence: Secondary | ICD-10-CM | POA: Diagnosis not present

## 2018-10-09 DIAGNOSIS — E039 Hypothyroidism, unspecified: Secondary | ICD-10-CM | POA: Diagnosis not present

## 2018-10-09 DIAGNOSIS — J449 Chronic obstructive pulmonary disease, unspecified: Secondary | ICD-10-CM | POA: Diagnosis not present

## 2018-10-09 DIAGNOSIS — M79661 Pain in right lower leg: Secondary | ICD-10-CM | POA: Diagnosis not present

## 2018-10-09 DIAGNOSIS — Z79899 Other long term (current) drug therapy: Secondary | ICD-10-CM | POA: Diagnosis not present

## 2018-10-09 DIAGNOSIS — I5032 Chronic diastolic (congestive) heart failure: Secondary | ICD-10-CM | POA: Insufficient documentation

## 2018-10-09 DIAGNOSIS — I130 Hypertensive heart and chronic kidney disease with heart failure and stage 1 through stage 4 chronic kidney disease, or unspecified chronic kidney disease: Secondary | ICD-10-CM | POA: Insufficient documentation

## 2018-10-09 DIAGNOSIS — N183 Chronic kidney disease, stage 3 (moderate): Secondary | ICD-10-CM | POA: Diagnosis not present

## 2018-10-09 DIAGNOSIS — Z794 Long term (current) use of insulin: Secondary | ICD-10-CM | POA: Insufficient documentation

## 2018-10-09 DIAGNOSIS — M79662 Pain in left lower leg: Secondary | ICD-10-CM | POA: Diagnosis present

## 2018-10-09 DIAGNOSIS — Z7982 Long term (current) use of aspirin: Secondary | ICD-10-CM | POA: Diagnosis not present

## 2018-10-09 DIAGNOSIS — E1122 Type 2 diabetes mellitus with diabetic chronic kidney disease: Secondary | ICD-10-CM | POA: Diagnosis not present

## 2018-10-09 DIAGNOSIS — D631 Anemia in chronic kidney disease: Secondary | ICD-10-CM | POA: Diagnosis not present

## 2018-10-09 DIAGNOSIS — G9340 Encephalopathy, unspecified: Secondary | ICD-10-CM | POA: Diagnosis not present

## 2018-10-09 DIAGNOSIS — N184 Chronic kidney disease, stage 4 (severe): Secondary | ICD-10-CM | POA: Diagnosis not present

## 2018-10-09 DIAGNOSIS — R52 Pain, unspecified: Secondary | ICD-10-CM | POA: Diagnosis not present

## 2018-10-09 NOTE — ED Notes (Signed)
Pt ambulatory from wheelchair to stretcher.

## 2018-10-09 NOTE — ED Triage Notes (Signed)
Pt C/o bilateral lower leg pain that started today. Pt denies any injury to the leg. No swelling noted. Pt states "nothing makes the pain worse and nothing makes the pain better."

## 2018-10-10 DIAGNOSIS — I5032 Chronic diastolic (congestive) heart failure: Secondary | ICD-10-CM | POA: Diagnosis not present

## 2018-10-10 DIAGNOSIS — I130 Hypertensive heart and chronic kidney disease with heart failure and stage 1 through stage 4 chronic kidney disease, or unspecified chronic kidney disease: Secondary | ICD-10-CM | POA: Diagnosis not present

## 2018-10-10 DIAGNOSIS — D631 Anemia in chronic kidney disease: Secondary | ICD-10-CM | POA: Diagnosis not present

## 2018-10-10 DIAGNOSIS — N184 Chronic kidney disease, stage 4 (severe): Secondary | ICD-10-CM | POA: Diagnosis not present

## 2018-10-10 DIAGNOSIS — G9340 Encephalopathy, unspecified: Secondary | ICD-10-CM | POA: Diagnosis not present

## 2018-10-10 DIAGNOSIS — E1122 Type 2 diabetes mellitus with diabetic chronic kidney disease: Secondary | ICD-10-CM | POA: Diagnosis not present

## 2018-10-10 LAB — COMPREHENSIVE METABOLIC PANEL
ALT: 18 U/L (ref 0–44)
AST: 16 U/L (ref 15–41)
Albumin: 3 g/dL — ABNORMAL LOW (ref 3.5–5.0)
Alkaline Phosphatase: 91 U/L (ref 38–126)
Anion gap: 8 (ref 5–15)
BUN: 28 mg/dL — ABNORMAL HIGH (ref 8–23)
CO2: 25 mmol/L (ref 22–32)
Calcium: 8.7 mg/dL — ABNORMAL LOW (ref 8.9–10.3)
Chloride: 107 mmol/L (ref 98–111)
Creatinine, Ser: 2.19 mg/dL — ABNORMAL HIGH (ref 0.44–1.00)
GFR calc Af Amer: 26 mL/min — ABNORMAL LOW (ref >60)
GFR calc non Af Amer: 23 mL/min — ABNORMAL LOW (ref >60)
Glucose, Bld: 176 mg/dL — ABNORMAL HIGH (ref 70–99)
Potassium: 3.4 mmol/L — ABNORMAL LOW (ref 3.5–5.1)
Sodium: 140 mmol/L (ref 135–145)
Total Bilirubin: 0.5 mg/dL (ref 0.3–1.2)
Total Protein: 6 g/dL — ABNORMAL LOW (ref 6.5–8.1)

## 2018-10-10 LAB — MAGNESIUM: Magnesium: 2 mg/dL (ref 1.7–2.4)

## 2018-10-10 LAB — D-DIMER, QUANTITATIVE: D-Dimer, Quant: 1.58 ug/mL-FEU — ABNORMAL HIGH (ref 0.00–0.50)

## 2018-10-10 IMAGING — DX DG FOOT COMPLETE 3+V*R*
3 series · 3 of 3 positions shown · non-contrast
Comparison: Plain film of the RIGHT foot dated 09/30/2010.

CLINICAL DATA: Pain to arch of foot, plantar side for 1 week. No
known injury. Diabetes. Sore at the head of the first metatarsal
bone. History of gout rheumatoid arthritis.

EXAM:
RIGHT FOOT COMPLETE - 3+ VIEW

[foot ap]
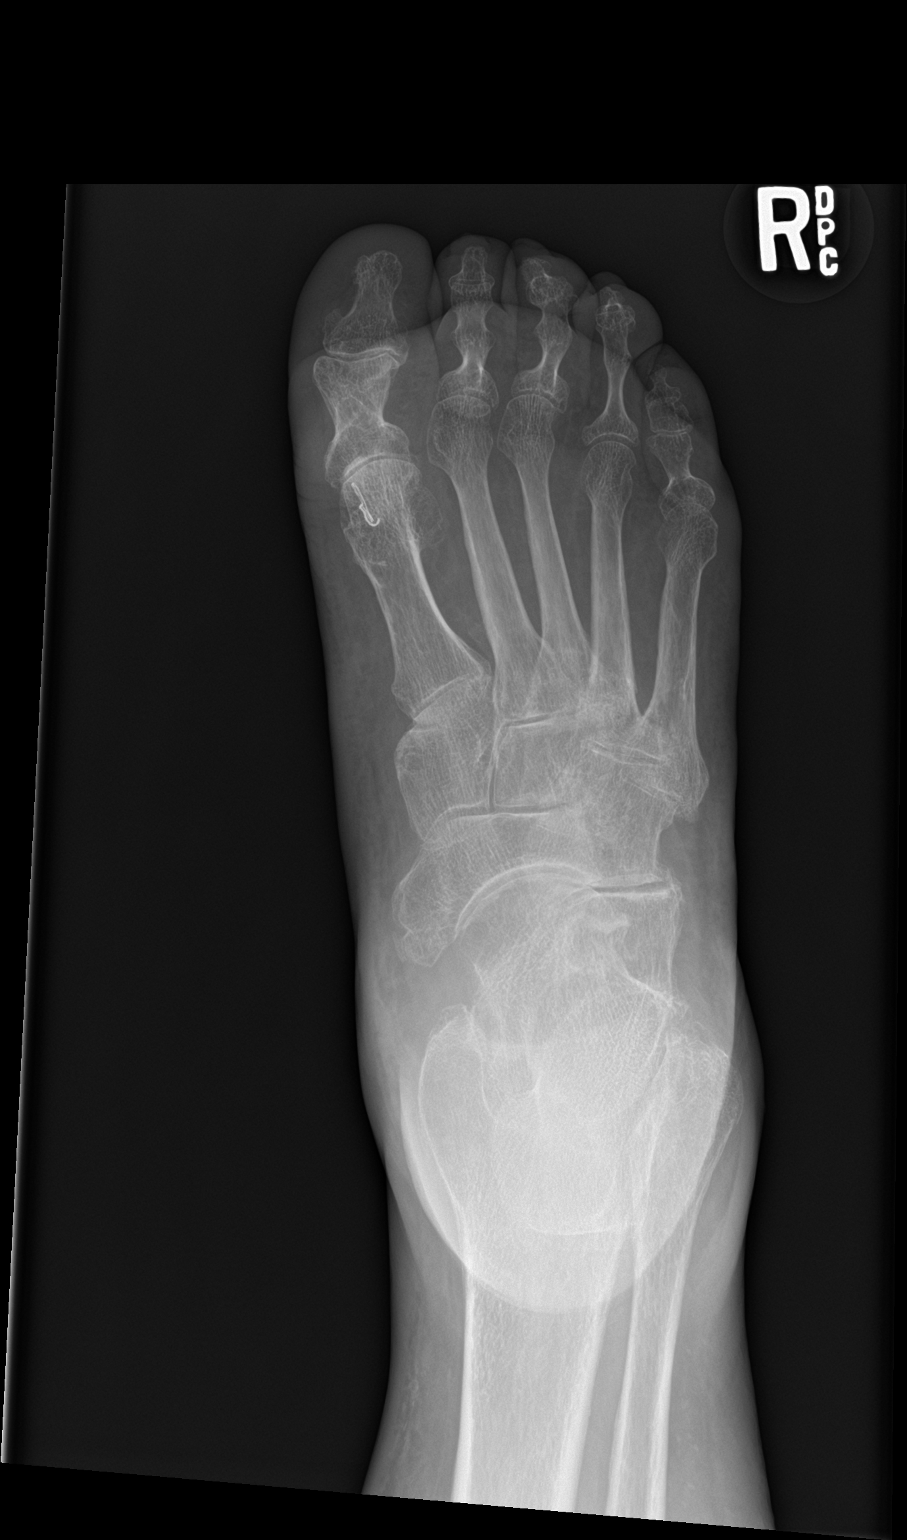

[foot obl]
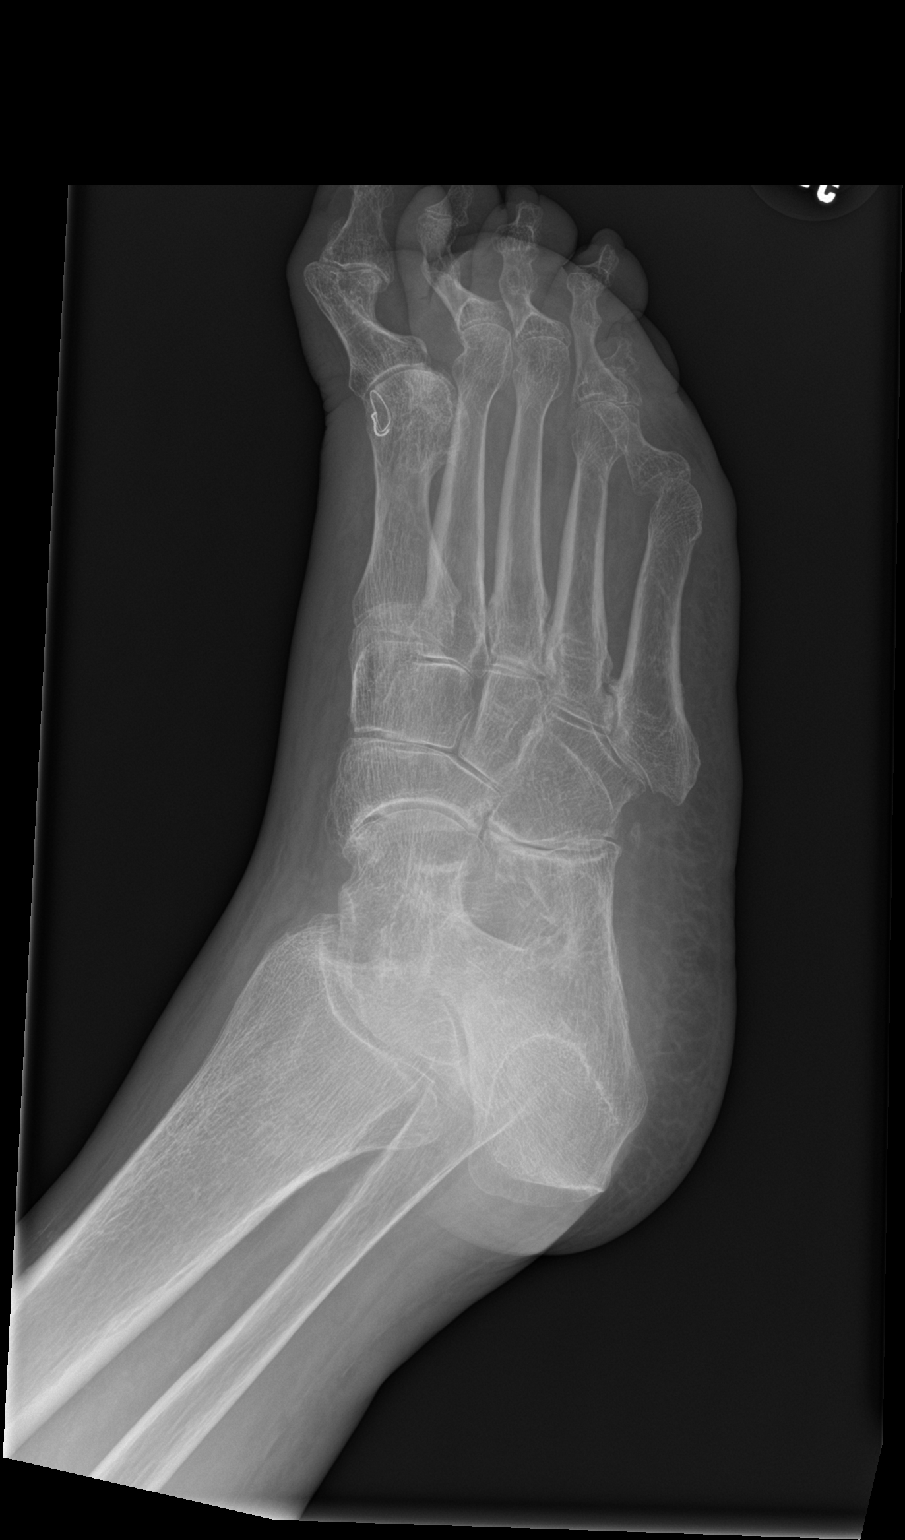

[foot lat]
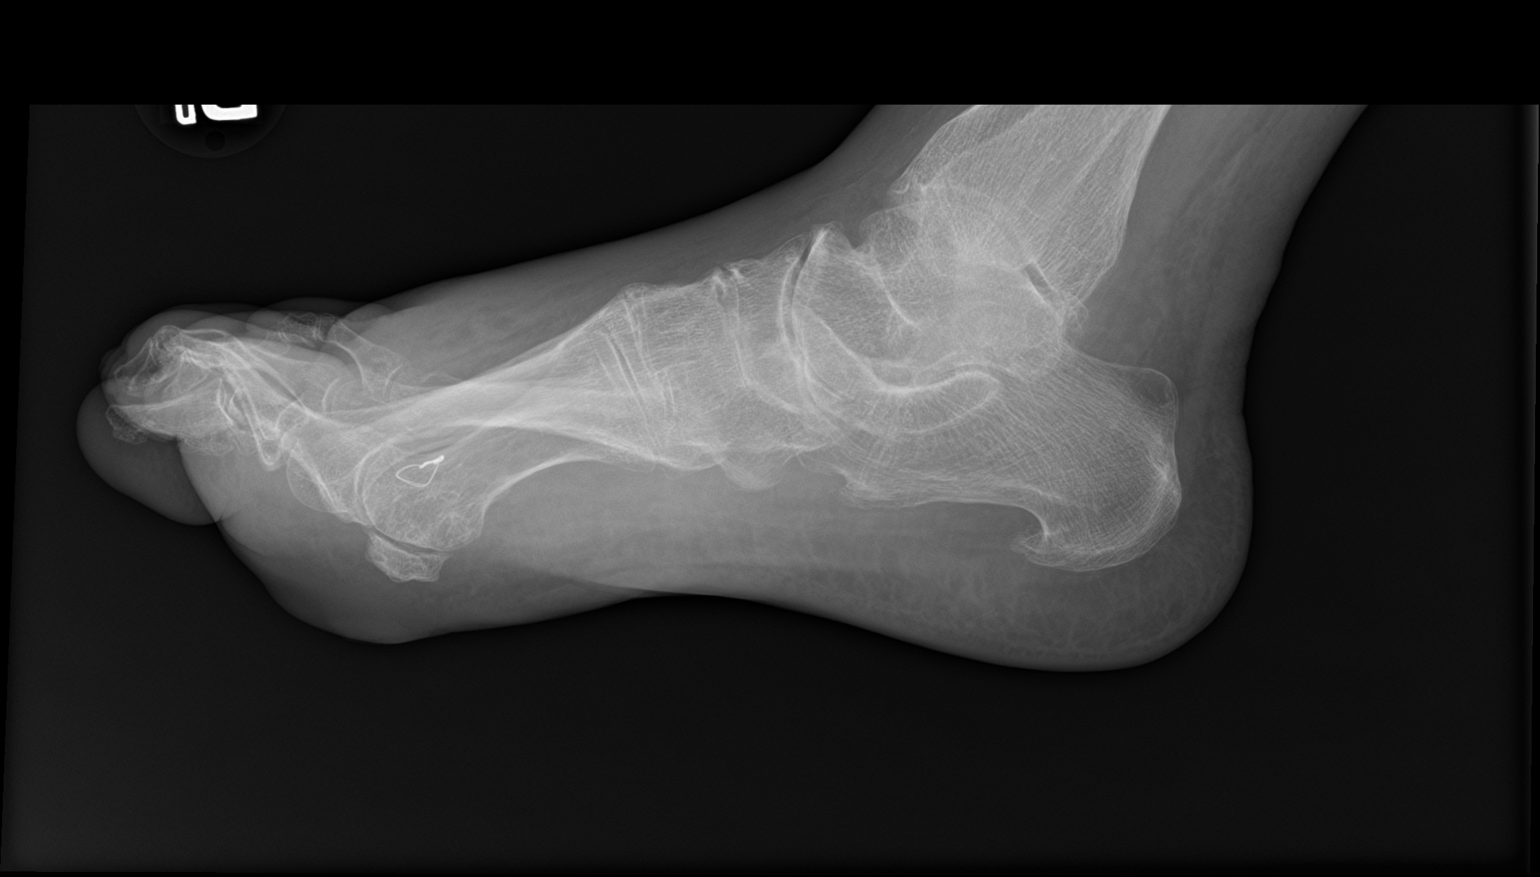

[3 of 3 positions shown; findings below may reference images not displayed]

FINDINGS: Fixation wire at the head of the first metatarsal bone, stable in
position. Surrounding osseous structures of the great toe appear
intact, stable in alignment and without acute or suspicious osseous
lesion. Remainder of the osseous structures are unremarkable without
acute or suspicious osseous finding. No fracture line or displaced
fracture fragment. No destructive change to suggest osteomyelitis.

Stable mild degenerative spurring noted within the midfoot and at
the base of the posterior calcaneus. Soft tissues about the RIGHT
foot are unremarkable.
IMPRESSION: Stable appearance of the RIGHT foot.  No acute findings.

## 2018-10-10 NOTE — ED Provider Notes (Signed)
[facility] EMERGENCY DEPARTMENT Provider Note   CSN: [id number] Arrival date & time: 10/09/18  1944  Time seen 11:29 PM  History   Chief Complaint Chief Complaint  Patient presents with  . Leg Pain    HPI [name] is a 68 y.o. female.     HPI patient presents from her nursing home.  She states she has been having leg cramps since this morning.  She states it is in bilateral legs from her knees down.  She states it has been there constantly all day and is "steady".  She states she has numbness and tingling in both legs.  However on further questioning what she says that she has neuropathy.  She denies any actual cramping or spasms in her legs.  She states she has had neuropathy in her legs since she was 68 years old when she was diagnosed with diabetes.  Patient is currently on gabapentin.  She has never heard of Lyrica.  PCP Glenda Chroman, MD   Past Medical History:  Diagnosis Date  . Anxiety   . Asthma   . Back pain   . Barrett esophagus   . CHF (congestive heart failure) ([place])   . CKD (chronic kidney disease) stage 3, GFR 30-59 ml/min (HCC)   . COPD (chronic obstructive pulmonary disease) ([place])   . Depression   . Diastolic heart failure ([place])   . Essential hypertension   . Fibromyalgia   . GERD (gastroesophageal reflux disease)   . Gout   . History of pericarditis   . Hyperlipidemia   . Hypothyroid   . Major depressive disorder   . Memory loss   . Nephrolithiasis    2008  . Obesity hypoventilation syndrome (HCC)    CPAP  . Obstructive sleep apnea   . PE (pulmonary embolism)    2010  . Peripheral neuropathy   . PTSD (post-traumatic stress disorder)   . Rheumatoid arthritis ([place])   . Schizophrenia ([place])   . Steroid dependence   . Type 2 diabetes mellitus ([place])   . Vitamin D deficiency     Patient Active Problem List   Diagnosis Date Noted  . Pressure injury of skin 09/17/2018  . Respiratory failure ([place]) 08/16/2018  . Seizure ([place]) 08/16/2018  .  Acute encephalopathy 07/27/2018  . Endotracheally intubated 07/27/2018  . Hypothyroidism 07/27/2018  . Pleuritic chest pain 07/14/2018  . Moderate persistent asthma without complication   . Type 2 diabetes mellitus ([place]) 04/19/2018  . Acute kidney injury superimposed on chronic kidney disease ([place]) 01/13/2018  . Altered mental status   . Encephalopathy 10/11/2016  . Chest pain 08/12/2016  . CKD (chronic kidney disease), stage IV ([place]) 08/12/2016  . History of pulmonary embolus (PE) 08/12/2016  . Obesity, Class III, BMI 40-49.9 (morbid obesity) ([place]) 08/12/2016  . RBBB 08/12/2016  . Positive D dimer 08/12/2016  . Cerebral infarction ([place]) 11/10/2015  . Physical deconditioning 07/01/2011  . Weakness 07/01/2011  . Diabetic foot ulcer ([place]) 05/19/2011  . Polypharmacy 02/09/2011  . BACK PAIN WITH RADICULOPATHY 05/25/2010  . Type 2 diabetes mellitus with diabetic polyneuropathy, without long-term current use of insulin ([place]) 08/18/2009  . Chronic diastolic heart failure ([place]) 04/25/2009  . Normocytic anemia 04/14/2009  . Sleep apnea 12/11/2008  . Diabetic peripheral neuropathy ([place]) 05/04/2007  . HYPERCHOLESTEROLEMIA 10/13/2006  . Gout, unspecified 10/13/2006  . HYPOKALEMIA 10/13/2006  . Schizophrenia ([place]) 10/13/2006  . Depression 10/13/2006  . Essential hypertension 10/13/2006  . Venous (peripheral) insufficiency 10/13/2006  .  RHINITIS, ALLERGIC 10/13/2006  . Asthma 10/13/2006  . Reflux esophagitis 10/13/2006  . OSTEOARTHRITIS, MULTI SITES 10/13/2006  . INCONTINENCE, URGE 10/13/2006    Past Surgical History:  Procedure Laterality Date  . APPENDECTOMY    . CHOLECYSTECTOMY    . COLONOSCOPY     benign polyps (12/2000), repeat colonoscopy performed in 2007  . ENDOMETRIAL BIOPSY     10/03 benign columnar mucosa (limited material)  . KIDNEY SURGERY       OB History   No obstetric history on file.      Home Medications    Prior to Admission medications   Medication Sig  Start Date End Date Taking? Authorizing Provider  acetaminophen (TYLENOL) 325 MG tablet Take 325 mg by mouth 2 (two) times daily.     [provider]  albuterol (PROVENTIL) (2.5 MG/3ML) 0.083% nebulizer solution Take 2.5 mg by nebulization every 6 (six) hours as needed for wheezing.     [provider]  albuterol (VENTOLIN HFA) 108 (90 Base) MCG/ACT inhaler Inhale 1-2 puffs into the lungs every 6 (six) hours as needed for wheezing or shortness of breath.    [provider]  amLODipine (NORVASC) 5 MG tablet Take 1 tablet (5 mg total) by mouth daily. 07/31/18 07/31/19  GhimireHenreitta Leber, MD  aspirin 81 MG tablet Take 81 mg by mouth daily.    [provider]  atorvastatin (LIPITOR) 40 MG tablet Take 40 mg by mouth at bedtime.    [provider]  carvedilol (COREG) 6.25 MG tablet Take 1 tablet (6.25 mg total) by mouth 2 (two) times daily with a meal. 08/18/18   Lavina Hamman, MD  Cholecalciferol (VITAMIN D3) 50 MCG (2000 UT) TABS Take 4,000 Units by mouth daily.    [provider]  docusate sodium (COLACE) 100 MG capsule Take 100 mg by mouth daily.     [provider]  Dulaglutide (TRULICITY) 1.5 TK/2.4OX SOPN Inject 1.5 mg into the skin once a week.    [provider]  EPINEPHrine (EPI-PEN) 0.3 mg/0.3 mL DEVI Inject 0.3 mg into the muscle once as needed (for bee stings, then go to the ED immediately after using).     [provider]  gabapentin (NEURONTIN) 300 MG capsule Take 300 mg by mouth 2 (two) times daily.  08/28/18   [provider]  glipiZIDE (GLUCOTROL XL) 5 MG 24 hr tablet Take 5 mg by mouth 2 (two) times daily.     [provider]  Glycerin, Laxative, (FLEET LIQUID GLYCERIN SUPP RE) Place 1 suppository rectally daily as needed (for constipation).    [provider]  haloperidol (HALDOL) 0.5 MG tablet Take 1 tablet (0.5 mg total) by mouth 2 (two) times daily. 07/31/18   Ghimire, Henreitta Leber, MD  hydrocortisone 2.5 % ointment Apply 1 application topically daily as needed (itchy skin).    [provider]  insulin degludec (TRESIBA FLEXTOUCH) 100 UNIT/ML SOPN FlexTouch Pen Inject 20 Units into the skin at bedtime.    [provider]  levETIRAcetam (KEPPRA) 500 MG tablet Take 1 tablet (500 mg total) by mouth 2 (two) times daily. 08/18/18   Lavina Hamman, MD  levothyroxine (SYNTHROID, LEVOTHROID) 25 MCG tablet Take 1 tablet (25 mcg total) by mouth daily at 6 (six) AM. 08/19/18   Lavina Hamman, MD  linagliptin (TRADJENTA) 5 MG TABS tablet Take 5 mg by mouth daily.    [provider]  loperamide (IMODIUM) 2 MG capsule Take  2 mg by mouth 3 (three) times daily as needed (for diarrhea).     [provider]  montelukast (SINGULAIR) 10 MG tablet Take 10 mg by mouth at bedtime.    [provider]  Multiple Vitamin (DAILY VITE PO) Take 1 tablet by mouth daily.    [provider]  nystatin (MYCOSTATIN) 100000 UNIT/ML suspension Take 5 mLs by mouth 3 (three) times daily. 10 day course starting on 09/06/2018    [provider]  omeprazole (PRILOSEC) 20 MG capsule Take 20 mg by mouth daily.     [provider]  OVER THE COUNTER MEDICATION CPAP    [provider]  phenazopyridine (PYRIDIUM) 100 MG tablet Take 200 mg by mouth 3 (three) times daily as needed for pain.    [provider]  polyethylene glycol powder (GLYCOLAX/MIRALAX) powder Take 17 g by mouth daily as needed (for constipation).    [provider]  pyridOXINE (VITAMIN B-6) 100 MG tablet Take 200 mg by mouth 2 (two) times daily.    [provider]  risperiDONE (RISPERDAL) 0.5 MG tablet Take 1 tablet (0.5 mg total) by mouth 2 (two) times daily for 30 days. Please give 0.5mg  in am and 1mg  at bedtime. 09/19/18 10/19/18  Manuella Ghazi, Pratik D, DO  spironolactone (ALDACTONE) 25 MG tablet Take 1 tablet (25 mg total) by mouth daily. 04/12/18   Mesner,  Corene Cornea, MD  torsemide (DEMADEX) 20 MG tablet Take 60 mg alternating with 40 mg daily. Patient taking differently: Take 40-60 mg by mouth See admin instructions. 60 mg by mouth every other day: alternating with 40 mg by mouth every other day 07/26/18   Erma Heritage, PA-C    Family History Family History  Problem Relation Age of Onset  . Bone cancer Mother   . Hypertension Mother   . Heart disease Mother   . COPD Father   . Heart disease Father   . Stroke Father     Social History Social History   Tobacco Use  . Smoking status: Former Smoker    Types: Cigarettes    Last attempt to quit: 06/23/1972    Years since quitting: 46.3  . Smokeless tobacco: Never Used  Substance Use Topics  . Alcohol use: No  . Drug use: No  lives in NH   Allergies   Benztropine mesylate; Codeine; Diazepam; Diphenhydramine hcl; Penicillins; Sulfonamide derivatives; Thioridazine hcl; Benztropine; Penicillin g; Lisinopril; and Sulfamethoxazole   Review of Systems Review of Systems  All other systems reviewed and are negative.    Physical Exam Updated Vital Signs BP 137/74 (BP Location: Right Arm)   Pulse 92   Temp 97.9 F (36.6 C) (Oral)   Resp 16   SpO2 100%   Vital signs normal    Physical Exam Vitals signs and nursing note reviewed.  Constitutional:      Appearance: Normal appearance.  HENT:     Head: Normocephalic and atraumatic.     Right Ear: External ear normal.     Left Ear: External ear normal.     Nose: Nose normal.  Eyes:     Extraocular Movements: Extraocular movements intact.     Conjunctiva/sclera: Conjunctivae normal.  Neck:     Musculoskeletal: Normal range of motion.  Cardiovascular:     Rate and Rhythm: Normal rate.  Pulmonary:     Effort: Pulmonary effort is normal. No respiratory distress.  Musculoskeletal:        General: Tenderness present.  Comments: Tenderness in bilateral calves without cords or masses, no erythema  Skin:    General: Skin  is warm and dry.     Findings: No erythema or rash.  Neurological:     General: No focal deficit present.     Mental Status: She is alert.     Cranial Nerves: No cranial nerve deficit.  Psychiatric:        Mood and Affect: Mood normal.        Behavior: Behavior normal.        Thought Content: Thought content normal.      ED Treatments / Results  Labs (all labs ordered are listed, but only abnormal results are displayed) Results for orders placed or performed during the hospital encounter of 10/09/18  D-dimer, quantitative  Result Value Ref Range   D-Dimer, Quant 1.58 (H) 0.00 - 0.50 ug/mL-FEU  Comprehensive metabolic panel  Result Value Ref Range   Sodium 140 135 - 145 mmol/L   Potassium 3.4 (L) 3.5 - 5.1 mmol/L   Chloride 107 98 - 111 mmol/L   CO2 25 22 - 32 mmol/L   Glucose, Bld 176 (H) 70 - 99 mg/dL   BUN 28 (H) 8 - 23 mg/dL   Creatinine, Ser 2.19 (H) 0.44 - 1.00 mg/dL   Calcium 8.7 (L) 8.9 - 10.3 mg/dL   Total Protein 6.0 (L) 6.5 - 8.1 g/dL   Albumin 3.0 (L) 3.5 - 5.0 g/dL   AST 16 15 - 41 U/L   ALT 18 0 - 44 U/L   Alkaline Phosphatase 91 38 - 126 U/L   Total Bilirubin 0.5 0.3 - 1.2 mg/dL   GFR calc non Af Amer 23 (L) >60 mL/min   GFR calc Af Amer 26 (L) >60 mL/min   Anion gap 8 5 - 15  Magnesium  Result Value Ref Range   Magnesium 2.0 1.7 - 2.4 mg/dL   Laboratory interpretation all normal except mild hypokalemia however she also has a low albumin, her calcium is in the same range it has been in multiple months.  Her d-dimer is also mildly elevated but it has been mildly elevated for the past year and actually is lower than her highest.    EKG None  Radiology No results found.  Procedures Procedures (including critical care time)  Medications Ordered in ED Medications - No data to display   Initial Impression / Assessment and Plan / ED Course  I have reviewed the triage vital signs and the nursing notes.  Pertinent labs & imaging results that were  available during my care of the patient were reviewed by me and considered in my medical decision making (see chart for details).        Review of recent labwork in the past few months shows no K, Mg abnormality, has had a minimally low calcium with low albumin that has been constant.   12:35 AM I reviewed her laboratory tests, her d-dimer is elevated but in her usual range that she has had this year.  She was discharged back to her facility.  She needs to discuss her neuropathy pain with her facility physician or I will refer her to a neurologist.  They can consider putting her on Lyrica in addition to her gabapentin for her discomfort.  However there is nothing emergently we can do in the emergency department tonight.  Please note patient has been admitted recently for several episodes of unresponsiveness and it spontaneously resolves.  Is a strong underlying  mental health diagnosis.  Final Clinical Impressions(s) / ED Diagnoses   Final diagnoses:  Painful diabetic neuropathy [facility])    ED Discharge Orders    None      Plan discharge  Rolland Porter, MD, Barbette Or, MD 10/10/18 0040

## 2018-10-10 NOTE — ED Notes (Signed)
Report called to [facility]

## 2018-10-10 NOTE — Discharge Instructions (Addendum)
You need to discuss your neuropathy pain with your physician.  If you do not have 1 you can be seen by Dr. Merlene Laughter, a neurologist.  They may consider putting you on Lyrica for your discomfort however that needs to be done by doctor who is going to follow you.

## 2018-10-11 DIAGNOSIS — N183 Chronic kidney disease, stage 3 (moderate): Secondary | ICD-10-CM | POA: Diagnosis not present

## 2018-10-11 DIAGNOSIS — Z6838 Body mass index (BMI) 38.0-38.9, adult: Secondary | ICD-10-CM | POA: Diagnosis not present

## 2018-10-11 DIAGNOSIS — F209 Schizophrenia, unspecified: Secondary | ICD-10-CM | POA: Diagnosis not present

## 2018-10-11 DIAGNOSIS — E1165 Type 2 diabetes mellitus with hyperglycemia: Secondary | ICD-10-CM | POA: Diagnosis not present

## 2018-10-11 DIAGNOSIS — G629 Polyneuropathy, unspecified: Secondary | ICD-10-CM | POA: Diagnosis not present

## 2018-10-11 DIAGNOSIS — I10 Essential (primary) hypertension: Secondary | ICD-10-CM | POA: Diagnosis not present

## 2018-10-11 DIAGNOSIS — Z299 Encounter for prophylactic measures, unspecified: Secondary | ICD-10-CM | POA: Diagnosis not present

## 2018-10-16 DIAGNOSIS — N184 Chronic kidney disease, stage 4 (severe): Secondary | ICD-10-CM | POA: Diagnosis not present

## 2018-10-16 DIAGNOSIS — I5032 Chronic diastolic (congestive) heart failure: Secondary | ICD-10-CM | POA: Diagnosis not present

## 2018-10-16 DIAGNOSIS — E1122 Type 2 diabetes mellitus with diabetic chronic kidney disease: Secondary | ICD-10-CM | POA: Diagnosis not present

## 2018-10-16 DIAGNOSIS — G9340 Encephalopathy, unspecified: Secondary | ICD-10-CM | POA: Diagnosis not present

## 2018-10-16 DIAGNOSIS — I130 Hypertensive heart and chronic kidney disease with heart failure and stage 1 through stage 4 chronic kidney disease, or unspecified chronic kidney disease: Secondary | ICD-10-CM | POA: Diagnosis not present

## 2018-10-16 DIAGNOSIS — D631 Anemia in chronic kidney disease: Secondary | ICD-10-CM | POA: Diagnosis not present

## 2018-10-18 DIAGNOSIS — I5032 Chronic diastolic (congestive) heart failure: Secondary | ICD-10-CM | POA: Diagnosis not present

## 2018-10-18 DIAGNOSIS — D631 Anemia in chronic kidney disease: Secondary | ICD-10-CM | POA: Diagnosis not present

## 2018-10-18 DIAGNOSIS — I130 Hypertensive heart and chronic kidney disease with heart failure and stage 1 through stage 4 chronic kidney disease, or unspecified chronic kidney disease: Secondary | ICD-10-CM | POA: Diagnosis not present

## 2018-10-18 DIAGNOSIS — N184 Chronic kidney disease, stage 4 (severe): Secondary | ICD-10-CM | POA: Diagnosis not present

## 2018-10-18 DIAGNOSIS — G9340 Encephalopathy, unspecified: Secondary | ICD-10-CM | POA: Diagnosis not present

## 2018-10-18 DIAGNOSIS — E1122 Type 2 diabetes mellitus with diabetic chronic kidney disease: Secondary | ICD-10-CM | POA: Diagnosis not present

## 2018-10-19 ENCOUNTER — Encounter: Payer: Self-pay | Admitting: Neurology

## 2018-10-19 ENCOUNTER — Ambulatory Visit (INDEPENDENT_AMBULATORY_CARE_PROVIDER_SITE_OTHER): Payer: Medicare Other | Admitting: Neurology

## 2018-10-19 VITALS — BP 142/72 | HR 84 | Ht 63.0 in | Wt 215.0 lb

## 2018-10-19 DIAGNOSIS — I639 Cerebral infarction, unspecified: Secondary | ICD-10-CM

## 2018-10-19 DIAGNOSIS — E1142 Type 2 diabetes mellitus with diabetic polyneuropathy: Secondary | ICD-10-CM

## 2018-10-19 DIAGNOSIS — G219 Secondary parkinsonism, unspecified: Secondary | ICD-10-CM | POA: Insufficient documentation

## 2018-10-19 DIAGNOSIS — G2111 Neuroleptic induced parkinsonism: Secondary | ICD-10-CM | POA: Diagnosis not present

## 2018-10-19 DIAGNOSIS — R569 Unspecified convulsions: Secondary | ICD-10-CM

## 2018-10-19 DIAGNOSIS — F203 Undifferentiated schizophrenia: Secondary | ICD-10-CM | POA: Diagnosis not present

## 2018-10-19 HISTORY — DX: Secondary parkinsonism, unspecified: G21.9

## 2018-10-19 MED ORDER — LAMOTRIGINE 25 MG PO TABS: ORAL_TABLET | ORAL | 3 refills | Status: DC

## 2018-10-19 NOTE — Progress Notes (Signed)
Reason for visit: Seizures  Referring physician: Dr. Fonnie Birkenhead is a 68 y.o. female  History of present illness:  [name] is a 68 year old right-handed white female with a history of a schizoaffective disorder.  The patient comes in today with a caretaker, the patient is the primary source of the history.  The patient herself claims that she has had the diagnosis of seizures since age 71, but she is unable to accurately describe exactly what happens with her seizures.  It appears that she has had episodes of what appears to be catatonia that may last several hours and then clear.  The patient has not been followed through a neurology physician at least for many years.  It is not exactly clear how she was diagnosed with seizures.  The patient has had episodes of catatonia she claims since she was a child, she has a diagnosis of a schizoaffective disorder.  She currently is living in an adult home called [facility].  The patient has diabetes with a severe diabetic peripheral neuropathy, she has burning in the feet, she reports frequent headaches.  The patient uses a walker for ambulation.  The patient has been in the hospital on multiple occasions in the last 6 months, she was admitted to the hospital in December 2019 and was diagnosed with a stroke but no acute stroke was apparent on MRI of the brain.  She has been in the hospital on 1 January, 7 January, 28 January, and 12 February of 2020.  All of these episodes were associated with episodes of catatonia.  The patient is on Haldol and Risperdal, she has developed parkinsonian features with a jaw tremor and tremor of the arms and of the left leg.  The patient had a fall 3 weeks ago when she rolled out of bed at night while sleeping, she has not fallen while using her walker.  She does report some mild memory issues.  She has had at least 2 EEG studies done recently that were normal.  Multiple MRI of the brain evaluations have been  unremarkable, the patient has minimal white matter changes.  She is sent to this office for an evaluation.  At some point in the last couple months she has been placed on Keppra for presumed seizures.  Within the last 3 days, the patient has developed pain and swelling of the right hand, and severe pain in the right shoulder developed within the last 24 hours, she does have a history of gout.  She will be seeing her primary care physician tomorrow for evaluation of this issue.  Past Medical History:  Diagnosis Date  . Anxiety   . Asthma   . Back pain   . Barrett esophagus   . CHF (congestive heart failure) ([place])   . CKD (chronic kidney disease) stage 3, GFR 30-59 ml/min (HCC)   . COPD (chronic obstructive pulmonary disease) ([place])   . Depression   . Diastolic heart failure ([place])   . Essential hypertension   . Fibromyalgia   . GERD (gastroesophageal reflux disease)   . Gout   . History of pericarditis   . Hyperlipidemia   . Hypothyroid   . Major depressive disorder   . Memory loss   . Nephrolithiasis    2008  . Obesity hypoventilation syndrome (HCC)    CPAP  . Obstructive sleep apnea   . PE (pulmonary embolism)    2010  . Peripheral neuropathy   . PTSD (post-traumatic stress  disorder)   . Rheumatoid arthritis ([place])   . Schizophrenia ([place])   . 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 Steroid dependence   . Type 2 diabetes mellitus ([place])   . Vitamin D deficiency     Past Surgical History:  Procedure Laterality Date  . APPENDECTOMY    . CHOLECYSTECTOMY    . COLONOSCOPY     benign polyps (12/2000), repeat colonoscopy performed in 2007  . ENDOMETRIAL BIOPSY     10/03 benign columnar mucosa (limited material)  . KIDNEY SURGERY      Family History  Problem Relation Age of Onset  . Bone cancer Mother   . Hypertension Mother   . Heart disease Mother   . COPD Father   . Heart disease Father   . Stroke Father     Social history:  reports that she quit smoking about  46 years ago. Her smoking use included cigarettes. She has never used smokeless tobacco. She reports that she does not drink alcohol or use drugs.  Medications:  Prior to Admission medications   Medication Sig Start Date End Date Taking? Authorizing Provider  acetaminophen (TYLENOL) 325 MG tablet Take 325 mg by mouth 2 (two) times daily.     [provider]  albuterol (PROVENTIL) (2.5 MG/3ML) 0.083% nebulizer solution Take 2.5 mg by nebulization every 6 (six) hours as needed for wheezing.     [provider]  albuterol (VENTOLIN HFA) 108 (90 Base) MCG/ACT inhaler Inhale 1-2 puffs into the lungs every 6 (six) hours as needed for wheezing or shortness of breath.    [provider]  amLODipine (NORVASC) 5 MG tablet Take 1 tablet (5 mg total) by mouth daily. 07/31/18 07/31/19  GhimireHenreitta Leber, MD  aspirin 81 MG tablet Take 81 mg by mouth daily.    [provider]  atorvastatin (LIPITOR) 40 MG tablet Take 40 mg by mouth at bedtime.    [provider]  carvedilol (COREG) 6.25 MG tablet Take 1 tablet (6.25 mg total) by mouth 2 (two) times daily with a meal. 08/18/18   Lavina Hamman, MD  Cholecalciferol (VITAMIN D3) 50 MCG (2000 UT) TABS Take 4,000 Units by mouth daily.    [provider]  docusate sodium (COLACE) 100 MG capsule Take 100 mg by mouth daily.     [provider]  Dulaglutide (TRULICITY) 1.5 OM/7.6HM SOPN Inject 1.5 mg into the skin once a week.    [provider]  EPINEPHrine (EPI-PEN) 0.3 mg/0.3 mL DEVI Inject 0.3 mg into the muscle once as needed (for bee stings, then go to the ED immediately after using).     [provider]  gabapentin (NEURONTIN) 300 MG capsule Take 300 mg by mouth 2 (two) times daily.  08/28/18   [provider]  glipiZIDE (GLUCOTROL XL) 5 MG 24 hr tablet Take 5 mg by mouth 2 (two) times daily.     [provider]  Glycerin, Laxative, (FLEET LIQUID GLYCERIN SUPP RE)  Place 1 suppository rectally daily as needed (for constipation).    [provider]  haloperidol (HALDOL) 0.5 MG tablet Take 1 tablet (0.5 mg total) by mouth 2 (two) times daily. 07/31/18   Ghimire, Henreitta Leber, MD  hydrocortisone 2.5 % ointment Apply 1 application topically daily as needed (itchy skin).    [provider]  insulin degludec (TRESIBA FLEXTOUCH) 100 UNIT/ML SOPN FlexTouch Pen Inject 20 Units into the skin at bedtime.    [provider]  levETIRAcetam (KEPPRA) 500 MG tablet Take 1 tablet (500 mg total) by mouth 2 (two) times daily. 08/18/18   Lavina Hamman, MD  levothyroxine (SYNTHROID, LEVOTHROID) 25 MCG tablet Take 1 tablet (25 mcg total) by mouth daily at 6 (six) AM. 08/19/18   Lavina Hamman, MD  linagliptin (TRADJENTA) 5 MG TABS tablet Take 5 mg by mouth daily.    [provider]  loperamide (IMODIUM) 2 MG capsule Take 2 mg by mouth 3 (three) times daily as needed (for diarrhea).     [provider]  montelukast (SINGULAIR) 10 MG tablet Take 10 mg by mouth at bedtime.    [provider]  Multiple Vitamin (DAILY VITE PO) Take 1 tablet by mouth daily.    [provider]  nystatin (MYCOSTATIN) 100000 UNIT/ML suspension Take 5 mLs by mouth 3 (three) times daily. 10 day course starting on 09/06/2018    [provider]  omeprazole (PRILOSEC) 20 MG capsule Take 20 mg by mouth daily.     [provider]  OVER THE COUNTER MEDICATION CPAP    [provider]  phenazopyridine (PYRIDIUM) 100 MG tablet Take 200 mg by mouth 3 (three) times daily as needed for pain.    [provider]  polyethylene glycol powder (GLYCOLAX/MIRALAX) powder Take 17 g by mouth daily as needed (for constipation).    [provider]  pyridOXINE (VITAMIN B-6) 100 MG tablet Take 200 mg by mouth 2 (two) times daily.    [provider]  risperiDONE (RISPERDAL) 0.5 MG tablet Take 1 tablet (0.5 mg total) by mouth  2 (two) times daily for 30 days. Please give 0.5mg  in am and 1mg  at bedtime. 09/19/18 10/19/18  Manuella Ghazi, Pratik D, DO  spironolactone (ALDACTONE) 25 MG tablet Take 1 tablet (25 mg total) by mouth daily. 04/12/18   Mesner, Corene Cornea, MD  torsemide (DEMADEX) 20 MG tablet Take 60 mg alternating with 40 mg daily. Patient taking differently: Take 40-60 mg by mouth See admin instructions. 60 mg by mouth every other day: alternating with 40 mg by mouth every other day 07/26/18   Erma Heritage, PA-C      Allergies  Allergen Reactions  . Benztropine Mesylate Other (See Comments)    Alopecia and white spots on eyes  . Codeine Swelling, Palpitations and Other (See Comments)    Mouth Swelling and Tachycardia   . Diazepam Other (See Comments)    COMA  . Diphenhydramine Hcl Anxiety, Palpitations and Other (See Comments)    Tachycardia and anxiousness  . Penicillins Other (See Comments)    REACTION: ulcers in mouth, swelling, throat swelling  Has patient had a PCN reaction causing immediate rash, facial/tongue/throat swelling, SOB or lightheadedness with hypotension: YES Has patient had a PCN reaction causing severe rash involving Mucus membranes or skin necrosis: Unknown Has patient had a PCN reaction that required hospitalization Unknown Has patient had a PCN reaction occurring within the last 10 years: unknown If all of the above answers are "NO", then may proceed with Cephalospori  . Sulfonamide Derivatives Rash    Blisters, also  . Thioridazine Hcl Swelling  . Benztropine Other (See Comments)    "Alopecia and white spots on eyes"  . Penicillin G Swelling and Other (See Comments)    Ulcers in mouth & tongue became swollen Has patient had a PCN reaction causing immediate rash, facial/tongue/throat swelling, SOB or lightheadedness with hypotension: Yes Has patient had a PCN reaction causing severe rash involving mucus membranes or  skin necrosis: Unk Has patient had a PCN reaction that required  hospitalization: Unk Has patient had a PCN reaction occurring within the last 10 years: Unk If all of the above answers are "NO", then may proceed with Cephalosporin use.   [name] Kitchen Lisinopril Cough  . Sulfamethoxazole Rash    ROS:  Out of a complete 14 system review of symptoms, the patient complains only of the following symptoms, and all other reviewed systems are negative.  Headache Numbness Right shoulder pain  Blood pressure (!) 142/72, pulse 84, height 5\' 3"  (1.6 m), weight 215 lb (97.5 kg).  Physical Exam  General: The patient is alert and cooperative at the time of the examination.  The patient is markedly obese.  Eyes: Pupils are equal, round, and reactive to light. Discs are flat bilaterally.  Neck: The neck is supple, no carotid bruits are noted.  Respiratory: The respiratory examination is clear.  Cardiovascular: The cardiovascular examination reveals a regular rate and rhythm, no obvious murmurs or rubs are noted.  Skin: Extremities are with 3+ edema below the knees bilaterally, the patient has some swelling of the right hand as well.  Neurologic Exam  Mental status: The patient is alert and oriented x 3 at the time of the examination. The patient has apparent normal recent and remote memory, with an apparently normal attention span and concentration ability.  Cranial nerves: Facial symmetry is present. There is good sensation of the face to pinprick and soft touch bilaterally. The strength of the facial muscles and the muscles to head turning and shoulder shrug are normal bilaterally. Speech is well enunciated, no aphasia or dysarthria is noted. Extraocular movements are full. Visual fields are full. The tongue is midline, and the patient has symmetric elevation of the soft palate. No obvious hearing deficits are noted.  Masking of the face is seen.  A jaw tremor is noted.  Motor: The motor testing reveals 5 over 5 strength of all 4 extremities, with exception of  bilateral foot drops. Good symmetric motor tone is noted throughout.  Sensory: Sensory testing is intact to pinprick, soft touch, vibration sensation, and position sense on the upper extremities.  With the lower extremities, the patient has severe depression of pinprick sensation to above the knees bilaterally, absence of vibration and position sense in both feet.  No evidence of extinction is noted.  Coordination: Cerebellar testing reveals good finger-nose-finger and heel-to-shin bilaterally.  Gait and station: Gait is wide-based, the patient usually uses a walker for ambulation.  Tandem gait was not attempted.  Romberg is negative.  Reflexes: Deep tendon reflexes are symmetric and normal bilaterally, with exception of absence of ankle jerk reflexes bilaterally. Toes are downgoing bilaterally.   EEG 09/15/18:  IMPRESSION: Normal electroencephalogram, awake and asleep. There are no focal lateralizing or epileptiform features.   EEG 08/16/18:  EEG Abnormalities: None  Clinical Interpretation: This normal EEG is recorded in the waking and sleep state. There was no seizure or seizure predisposition recorded on this study. Please note that lack of epileptiform activity on EEG does not preclude the possibility of epilepsy.    MRI brain 09/27/18:  IMPRESSION: 1. No acute intracranial abnormality. 2. Mild chronic small vessel ischemic disease and cerebral atrophy.  * MRI scan images were reviewed online. I agree with the written report.    Assessment/Plan:  1.  Schizoaffective disorder  2.  History of seizures  3.  Episodes of catatonia  4.  Diabetes with severe diabetic peripheral neuropathy  5.  Gait disorder  6.  Secondary parkinsonism  The diagnosis of seizures in my mind is somewhat questionable.  The patient appears to be having episodes of catatonia, epileptic activity around these times has not been documented, MRI of the brain is almost normal with exception of very  minimal white matter disease.  The patient has developed symptoms of secondary parkinsonism on Haldol and Risperdal combination, she will be seen through [facility] in the near future.  The selection of Keppra for treatment of seizures is very poor, this medication may result in mood destabilization, anxiety, and psychosis.  The Keppra will be reduced to 500 mg daily for 2 weeks and then stop the drug, we will start Lamictal working up to 75 mg twice daily.  Lamictal may act as a mood stabilizer.  The patient will follow-up here in about 4 months.  Jill Alexanders MD 10/19/2018 11:04 AM  [facility] [address] [place], [place] [address]  Phone [phone] Fax [phone]

## 2018-10-19 NOTE — Patient Instructions (Signed)
Reduce Keppra to 500 mg daily for 2 weeks, then stop.  We will start Lamictal for seizures and as a mood stabilizer.

## 2018-10-20 DIAGNOSIS — Z6829 Body mass index (BMI) 29.0-29.9, adult: Secondary | ICD-10-CM | POA: Diagnosis not present

## 2018-10-20 DIAGNOSIS — Z299 Encounter for prophylactic measures, unspecified: Secondary | ICD-10-CM | POA: Diagnosis not present

## 2018-10-20 DIAGNOSIS — M7989 Other specified soft tissue disorders: Secondary | ICD-10-CM | POA: Diagnosis not present

## 2018-10-20 DIAGNOSIS — I10 Essential (primary) hypertension: Secondary | ICD-10-CM | POA: Diagnosis not present

## 2018-10-20 DIAGNOSIS — E1165 Type 2 diabetes mellitus with hyperglycemia: Secondary | ICD-10-CM | POA: Diagnosis not present

## 2018-10-20 DIAGNOSIS — F419 Anxiety disorder, unspecified: Secondary | ICD-10-CM | POA: Diagnosis not present

## 2018-10-24 DIAGNOSIS — I5032 Chronic diastolic (congestive) heart failure: Secondary | ICD-10-CM | POA: Diagnosis not present

## 2018-10-24 DIAGNOSIS — I130 Hypertensive heart and chronic kidney disease with heart failure and stage 1 through stage 4 chronic kidney disease, or unspecified chronic kidney disease: Secondary | ICD-10-CM | POA: Diagnosis not present

## 2018-10-24 DIAGNOSIS — E1122 Type 2 diabetes mellitus with diabetic chronic kidney disease: Secondary | ICD-10-CM | POA: Diagnosis not present

## 2018-10-24 DIAGNOSIS — D631 Anemia in chronic kidney disease: Secondary | ICD-10-CM | POA: Diagnosis not present

## 2018-10-24 DIAGNOSIS — G9340 Encephalopathy, unspecified: Secondary | ICD-10-CM | POA: Diagnosis not present

## 2018-10-24 DIAGNOSIS — N184 Chronic kidney disease, stage 4 (severe): Secondary | ICD-10-CM | POA: Diagnosis not present

## 2018-11-06 ENCOUNTER — Ambulatory Visit: Payer: Medicare Other | Admitting: Cardiology

## 2018-11-16 IMAGING — DX DG HAND COMPLETE 3+V*L*
3 series · 3 of 3 positions shown · non-contrast
Comparison: None.

CLINICAL DATA: Acute left hand pain.

EXAM:
LEFT HAND - COMPLETE 3+ VIEW

[hand pa]
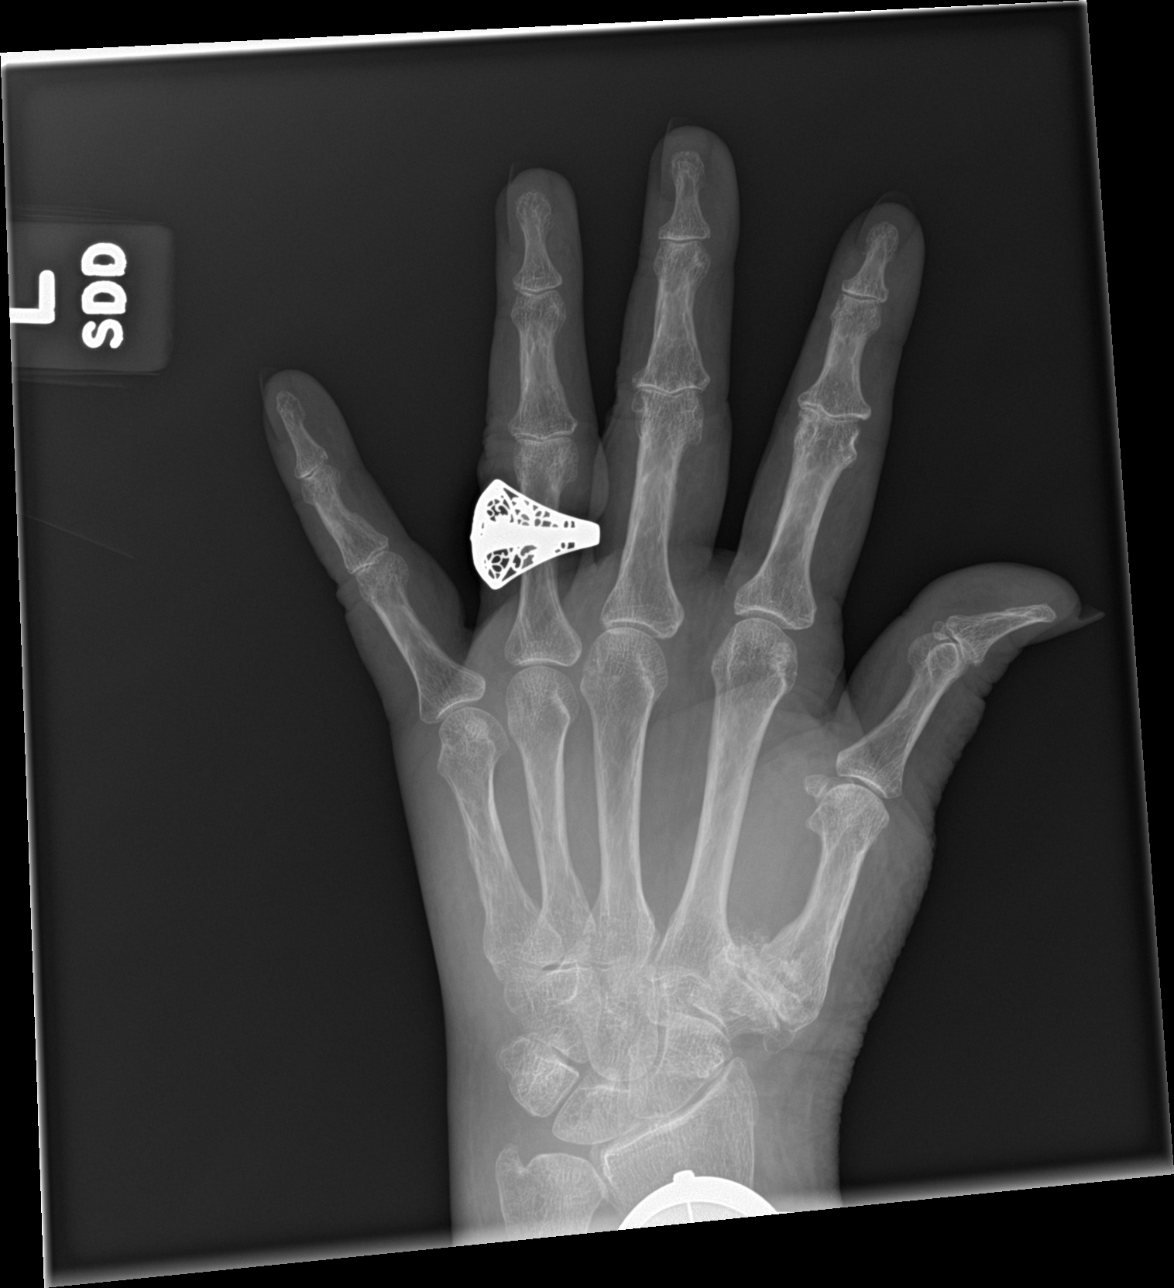

[hand obl]
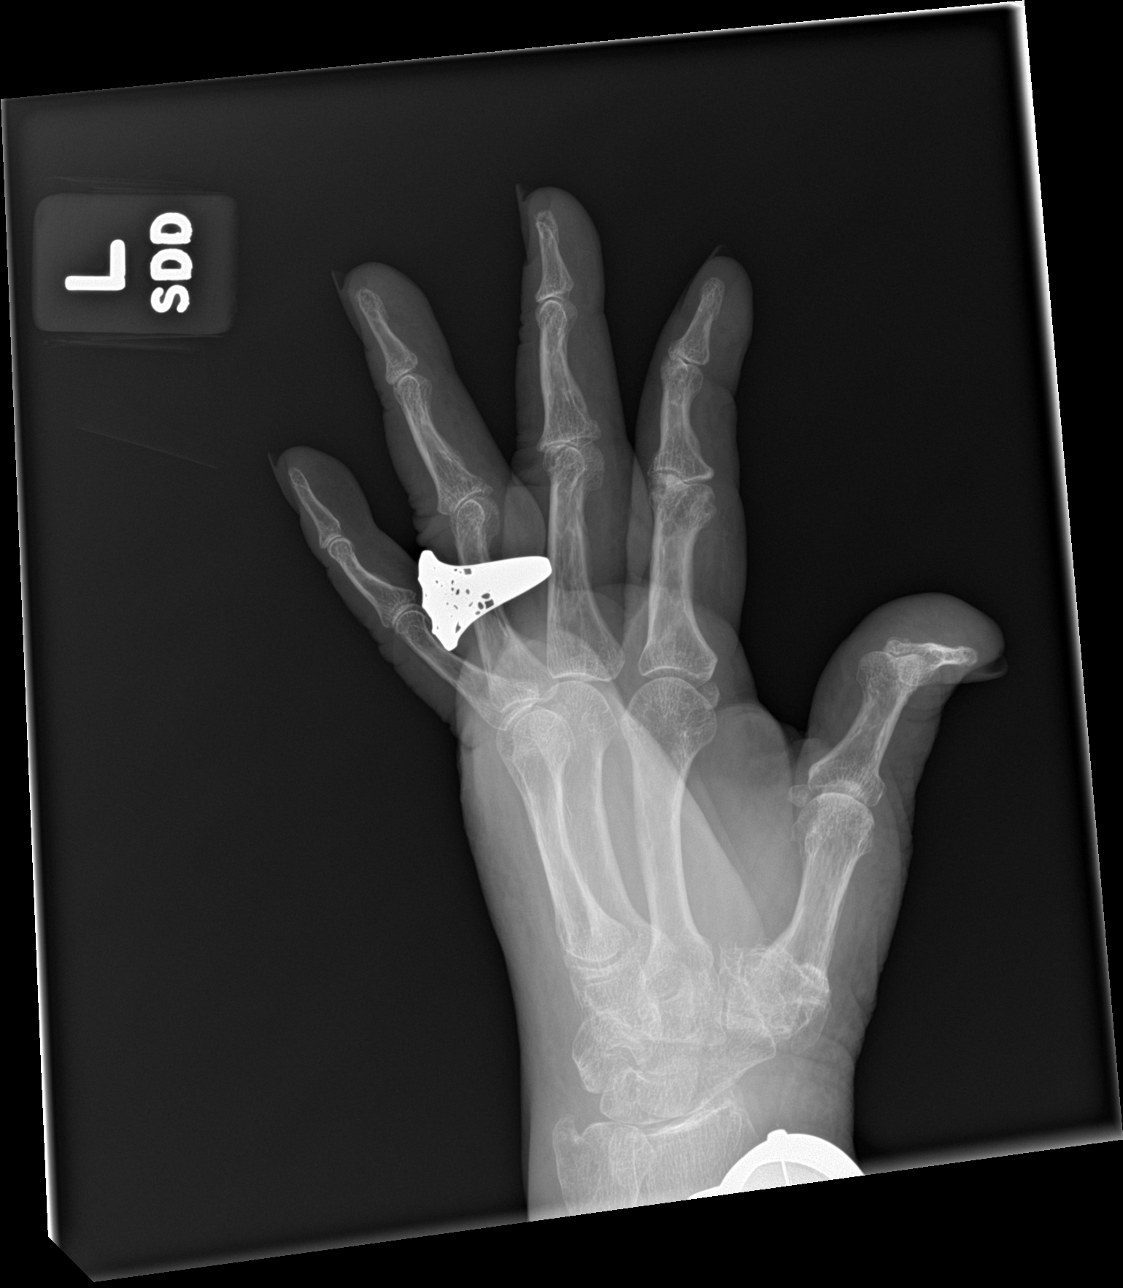

[hand lat]
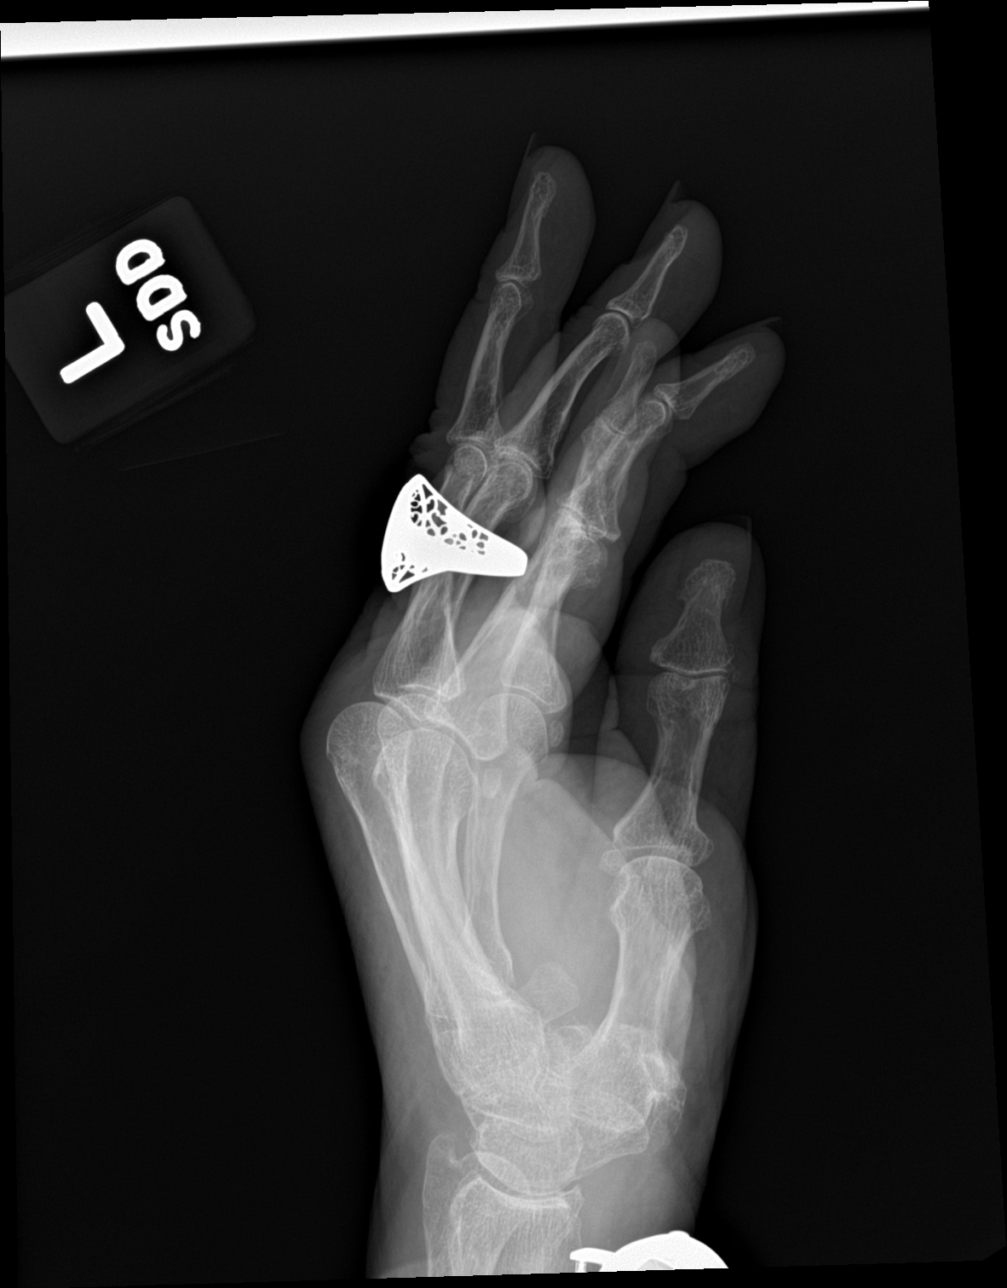

[3 of 3 positions shown; findings below may reference images not displayed]

FINDINGS: There is no evidence of fracture or dislocation. Severe degenerative
changes seen involving the first carpometacarpal joint. Soft tissues
are unremarkable.
IMPRESSION: Osteoarthritis of the first carpometacarpal joint. No acute
abnormality seen in the left hand.

## 2018-11-17 DIAGNOSIS — E1165 Type 2 diabetes mellitus with hyperglycemia: Secondary | ICD-10-CM | POA: Diagnosis not present

## 2018-11-17 DIAGNOSIS — Z87891 Personal history of nicotine dependence: Secondary | ICD-10-CM | POA: Diagnosis not present

## 2018-11-17 DIAGNOSIS — Z299 Encounter for prophylactic measures, unspecified: Secondary | ICD-10-CM | POA: Diagnosis not present

## 2018-11-17 DIAGNOSIS — N189 Chronic kidney disease, unspecified: Secondary | ICD-10-CM | POA: Diagnosis not present

## 2018-11-17 DIAGNOSIS — L909 Atrophic disorder of skin, unspecified: Secondary | ICD-10-CM | POA: Diagnosis not present

## 2018-11-17 DIAGNOSIS — F209 Schizophrenia, unspecified: Secondary | ICD-10-CM | POA: Diagnosis not present

## 2018-11-20 DIAGNOSIS — Z209 Contact with and (suspected) exposure to unspecified communicable disease: Secondary | ICD-10-CM | POA: Diagnosis not present

## 2018-11-20 DIAGNOSIS — R0602 Shortness of breath: Secondary | ICD-10-CM | POA: Diagnosis not present

## 2018-11-22 ENCOUNTER — Telehealth: Payer: Self-pay

## 2018-11-22 DIAGNOSIS — Z6839 Body mass index (BMI) 39.0-39.9, adult: Secondary | ICD-10-CM | POA: Diagnosis not present

## 2018-11-22 DIAGNOSIS — J209 Acute bronchitis, unspecified: Secondary | ICD-10-CM | POA: Diagnosis not present

## 2018-11-22 DIAGNOSIS — R05 Cough: Secondary | ICD-10-CM | POA: Diagnosis not present

## 2018-11-22 DIAGNOSIS — Z299 Encounter for prophylactic measures, unspecified: Secondary | ICD-10-CM | POA: Diagnosis not present

## 2018-11-22 DIAGNOSIS — J029 Acute pharyngitis, unspecified: Secondary | ICD-10-CM | POA: Diagnosis not present

## 2018-11-22 NOTE — Telephone Encounter (Signed)
Called to talk to pt's caregiver. They will do video call.     Virtual Visit Pre-Appointment Phone Call  Steps For Call:  1. Confirm consent - "In the setting of the current Covid19 crisis, you are scheduled for a (phone or video) visit with your provider on (date) at (time).  Just as we do with many in-office visits, in order for you to participate in this visit, we must obtain consent.  If you'd like, I can send this to your mychart (if signed up) or email for you to review.  Otherwise, I can obtain your verbal consent now.  All virtual visits are billed to your insurance company just like a normal visit would be.  By agreeing to a virtual visit, we'd like you to understand that the technology does not allow for your provider to perform an examination, and thus may limit your provider's ability to fully assess your condition.  Finally, though the technology is pretty good, we cannot assure that it will always work on either your or our end, and in the setting of a video visit, we may have to convert it to a phone-only visit.  In either situation, we cannot ensure that we have a secure connection.  Are you willing to proceed?"  2. Give patient instructions for WebEx download to smartphone as below if video visit  3. Advise patient to be prepared with any vital sign or heart rhythm information, their current medicines, and a piece of paper and pen handy for any instructions they may receive the day of their visit  4. Inform patient they will receive a phone call 15 minutes prior to their appointment time (may be from unknown caller ID) so they should be prepared to answer  5. Confirm that appointment type is correct in Epic appointment notes (video vs telephone)    TELEPHONE CALL NOTE  [name] has been deemed a candidate for a follow-up tele-health visit to limit community exposure during the Covid-19 pandemic. I spoke with the patient via phone to ensure availability of phone/video  source, confirm preferred email & phone number, and discuss instructions and expectations.  I reminded [name] to be prepared with any vital sign and/or heart rhythm information that could potentially be obtained via home monitoring, at the time of her visit. I reminded [name] to expect a phone call at the time of her visit if her visit.  Did the patient verbally acknowledge consent to treatment? Yes   Drema Dallas, [place] 11/22/2018 2:00 PM   DOWNLOADING THE [place], go to [employer] and type in WebEx in the search bar. [employer], the blue/green circle. The app is free but as with any other app downloads, their phone may require them to verify saved payment information or Apple password. The patient does NOT have to create an account.  - If Android, ask patient to go to [employer] and type in WebEx in the search bar. [place] [employer], the blue/green circle. The app is free but as with any other app downloads, their phone may require them to verify saved payment information or Android password. The patient does NOT have to create an account.   CONSENT FOR TELE-HEALTH VISIT - PLEASE REVIEW  I hereby voluntarily request, consent and authorize [facility] and its employed or contracted physicians, physician assistants, nurse practitioners or other licensed health care professionals (the Practitioner), to provide me with telemedicine health  care services (the "Services") as deemed necessary by the treating Practitioner. I acknowledge and consent to receive the Services by the Practitioner via telemedicine. I understand that the telemedicine visit will involve communicating with the Practitioner through live audiovisual communication technology and the disclosure of certain medical information by electronic transmission. I acknowledge that I have been given the opportunity to request an in-person assessment or other  available alternative prior to the telemedicine visit and am voluntarily participating in the telemedicine visit.  I understand that I have the right to withhold or withdraw my consent to the use of telemedicine in the course of my care at any time, without affecting my right to future care or treatment, and that the Practitioner or I may terminate the telemedicine visit at any time. I understand that I have the right to inspect all information obtained and/or recorded in the course of the telemedicine visit and may receive copies of available information for a reasonable fee.  I understand that some of the potential risks of receiving the Services via telemedicine include:  [name] Kitchen Delay or interruption in medical evaluation due to technological equipment failure or disruption; . Information transmitted may not be sufficient (e.g. poor resolution of images) to allow for appropriate medical decision making by the Practitioner; and/or  . In rare instances, security protocols could fail, causing a breach of personal health information.  Furthermore, I acknowledge that it is my responsibility to provide information about my medical history, conditions and care that is complete and accurate to the best of my ability. I acknowledge that Practitioner's advice, recommendations, and/or decision may be based on factors not within their control, such as incomplete or inaccurate data provided by me or distortions of diagnostic images or specimens that may result from electronic transmissions. I understand that the practice of medicine is not an exact science and that Practitioner makes no warranties or guarantees regarding treatment outcomes. I acknowledge that I will receive a copy of this consent concurrently upon execution via email to the email address I last provided but may also request a printed copy by calling the office of [place].    I understand that my insurance will be billed for this visit.   I have  read or had this consent read to me. . I understand the contents of this consent, which adequately explains the benefits and risks of the Services being provided via telemedicine.  . I have been provided ample opportunity to ask questions regarding this consent and the Services and have had my questions answered to my satisfaction. . I give my informed consent for the services to be provided through the use of telemedicine in my medical care  By participating in this telemedicine visit I agree to the above.

## 2018-11-27 DIAGNOSIS — Z299 Encounter for prophylactic measures, unspecified: Secondary | ICD-10-CM | POA: Diagnosis not present

## 2018-11-27 DIAGNOSIS — J309 Allergic rhinitis, unspecified: Secondary | ICD-10-CM | POA: Diagnosis not present

## 2018-11-27 DIAGNOSIS — G250 Essential tremor: Secondary | ICD-10-CM | POA: Diagnosis not present

## 2018-11-27 DIAGNOSIS — J449 Chronic obstructive pulmonary disease, unspecified: Secondary | ICD-10-CM | POA: Diagnosis not present

## 2018-11-27 DIAGNOSIS — Z6839 Body mass index (BMI) 39.0-39.9, adult: Secondary | ICD-10-CM | POA: Diagnosis not present

## 2018-11-27 DIAGNOSIS — I10 Essential (primary) hypertension: Secondary | ICD-10-CM | POA: Diagnosis not present

## 2018-11-28 ENCOUNTER — Observation Stay (HOSPITAL_COMMUNITY)
Admission: EM | Admit: 2018-11-28 | Discharge: 2018-11-29 | Disposition: A | Discharge status: Home / Self Care | Payer: Medicare Other | Attending: Internal Medicine | Admitting: Internal Medicine

## 2018-11-28 ENCOUNTER — Other Ambulatory Visit: Payer: Self-pay

## 2018-11-28 ENCOUNTER — Emergency Department (HOSPITAL_COMMUNITY): Payer: Medicare Other

## 2018-11-28 ENCOUNTER — Encounter (HOSPITAL_COMMUNITY): Payer: Self-pay | Admitting: Emergency Medicine

## 2018-11-28 DIAGNOSIS — I10 Essential (primary) hypertension: Secondary | ICD-10-CM | POA: Diagnosis not present

## 2018-11-28 DIAGNOSIS — N184 Chronic kidney disease, stage 4 (severe): Secondary | ICD-10-CM | POA: Diagnosis present

## 2018-11-28 DIAGNOSIS — R55 Syncope and collapse: Secondary | ICD-10-CM | POA: Diagnosis not present

## 2018-11-28 DIAGNOSIS — Y92009 Unspecified place in unspecified non-institutional (private) residence as the place of occurrence of the external cause: Secondary | ICD-10-CM | POA: Diagnosis not present

## 2018-11-28 DIAGNOSIS — Z87898 Personal history of other specified conditions: Secondary | ICD-10-CM | POA: Diagnosis not present

## 2018-11-28 DIAGNOSIS — F259 Schizoaffective disorder, unspecified: Secondary | ICD-10-CM | POA: Diagnosis not present

## 2018-11-28 DIAGNOSIS — E039 Hypothyroidism, unspecified: Secondary | ICD-10-CM | POA: Diagnosis not present

## 2018-11-28 DIAGNOSIS — E785 Hyperlipidemia, unspecified: Secondary | ICD-10-CM | POA: Diagnosis not present

## 2018-11-28 DIAGNOSIS — W19XXXA Unspecified fall, initial encounter: Secondary | ICD-10-CM | POA: Diagnosis not present

## 2018-11-28 DIAGNOSIS — R569 Unspecified convulsions: Secondary | ICD-10-CM | POA: Diagnosis not present

## 2018-11-28 DIAGNOSIS — G4730 Sleep apnea, unspecified: Secondary | ICD-10-CM | POA: Diagnosis not present

## 2018-11-28 DIAGNOSIS — I5032 Chronic diastolic (congestive) heart failure: Secondary | ICD-10-CM | POA: Insufficient documentation

## 2018-11-28 DIAGNOSIS — Z7982 Long term (current) use of aspirin: Secondary | ICD-10-CM | POA: Insufficient documentation

## 2018-11-28 DIAGNOSIS — S299XXA Unspecified injury of thorax, initial encounter: Secondary | ICD-10-CM | POA: Diagnosis not present

## 2018-11-28 DIAGNOSIS — F203 Undifferentiated schizophrenia: Secondary | ICD-10-CM | POA: Diagnosis not present

## 2018-11-28 DIAGNOSIS — Z9181 History of falling: Secondary | ICD-10-CM | POA: Insufficient documentation

## 2018-11-28 DIAGNOSIS — I499 Cardiac arrhythmia, unspecified: Secondary | ICD-10-CM | POA: Diagnosis not present

## 2018-11-28 DIAGNOSIS — G4733 Obstructive sleep apnea (adult) (pediatric): Secondary | ICD-10-CM | POA: Diagnosis not present

## 2018-11-28 DIAGNOSIS — E119 Type 2 diabetes mellitus without complications: Secondary | ICD-10-CM | POA: Insufficient documentation

## 2018-11-28 DIAGNOSIS — Z79899 Other long term (current) drug therapy: Secondary | ICD-10-CM | POA: Diagnosis not present

## 2018-11-28 DIAGNOSIS — I130 Hypertensive heart and chronic kidney disease with heart failure and stage 1 through stage 4 chronic kidney disease, or unspecified chronic kidney disease: Secondary | ICD-10-CM | POA: Diagnosis not present

## 2018-11-28 DIAGNOSIS — S60922A Unspecified superficial injury of left hand, initial encounter: Secondary | ICD-10-CM | POA: Diagnosis not present

## 2018-11-28 DIAGNOSIS — I872 Venous insufficiency (chronic) (peripheral): Secondary | ICD-10-CM | POA: Diagnosis present

## 2018-11-28 DIAGNOSIS — E1142 Type 2 diabetes mellitus with diabetic polyneuropathy: Secondary | ICD-10-CM | POA: Diagnosis present

## 2018-11-28 DIAGNOSIS — R609 Edema, unspecified: Secondary | ICD-10-CM | POA: Diagnosis not present

## 2018-11-28 DIAGNOSIS — F29 Unspecified psychosis not due to a substance or known physiological condition: Secondary | ICD-10-CM | POA: Diagnosis not present

## 2018-11-28 DIAGNOSIS — R4182 Altered mental status, unspecified: Secondary | ICD-10-CM | POA: Diagnosis present

## 2018-11-28 DIAGNOSIS — K219 Gastro-esophageal reflux disease without esophagitis: Secondary | ICD-10-CM | POA: Insufficient documentation

## 2018-11-28 DIAGNOSIS — E7800 Pure hypercholesterolemia, unspecified: Secondary | ICD-10-CM | POA: Diagnosis present

## 2018-11-28 DIAGNOSIS — Z87891 Personal history of nicotine dependence: Secondary | ICD-10-CM | POA: Insufficient documentation

## 2018-11-28 DIAGNOSIS — G9340 Encephalopathy, unspecified: Secondary | ICD-10-CM | POA: Diagnosis present

## 2018-11-28 DIAGNOSIS — R0689 Other abnormalities of breathing: Secondary | ICD-10-CM | POA: Diagnosis not present

## 2018-11-28 DIAGNOSIS — R404 Transient alteration of awareness: Secondary | ICD-10-CM | POA: Diagnosis not present

## 2018-11-28 DIAGNOSIS — K2100 Gastro-esophageal reflux disease with esophagitis, without bleeding: Secondary | ICD-10-CM | POA: Diagnosis present

## 2018-11-28 DIAGNOSIS — F209 Schizophrenia, unspecified: Secondary | ICD-10-CM | POA: Diagnosis present

## 2018-11-28 DIAGNOSIS — I5033 Acute on chronic diastolic (congestive) heart failure: Secondary | ICD-10-CM | POA: Diagnosis present

## 2018-11-28 LAB — CBC WITH DIFFERENTIAL/PLATELET
Abs Immature Granulocytes: 0.08 10*3/uL — ABNORMAL HIGH (ref 0.00–0.07)
Basophils Absolute: 0 10*3/uL (ref 0.0–0.1)
Basophils Relative: 0 %
Eosinophils Absolute: 0.1 10*3/uL (ref 0.0–0.5)
Eosinophils Relative: 1 %
HCT: 28.4 % — ABNORMAL LOW (ref 36.0–46.0)
Hemoglobin: 9 g/dL — ABNORMAL LOW (ref 12.0–15.0)
Immature Granulocytes: 1 %
Lymphocytes Relative: 6 %
Lymphs Abs: 0.6 10*3/uL — ABNORMAL LOW (ref 0.7–4.0)
MCH: 30.7 pg (ref 26.0–34.0)
MCHC: 31.7 g/dL (ref 30.0–36.0)
MCV: 96.9 fL (ref 80.0–100.0)
Monocytes Absolute: 0.4 10*3/uL (ref 0.1–1.0)
Monocytes Relative: 4 %
Neutro Abs: 8.8 10*3/uL — ABNORMAL HIGH (ref 1.7–7.7)
Neutrophils Relative %: 88 %
Platelets: 219 10*3/uL (ref 150–400)
RBC: 2.93 MIL/uL — ABNORMAL LOW (ref 3.87–5.11)
RDW: 13.6 % (ref 11.5–15.5)
WBC: 9.9 10*3/uL (ref 4.0–10.5)
nRBC: 0 % (ref 0.0–0.2)

## 2018-11-28 LAB — COMPREHENSIVE METABOLIC PANEL
ALT: 23 U/L (ref 0–44)
AST: 21 U/L (ref 15–41)
Albumin: 3.4 g/dL — ABNORMAL LOW (ref 3.5–5.0)
Alkaline Phosphatase: 89 U/L (ref 38–126)
Anion gap: 10 (ref 5–15)
BUN: 41 mg/dL — ABNORMAL HIGH (ref 8–23)
CO2: 24 mmol/L (ref 22–32)
Calcium: 8.5 mg/dL — ABNORMAL LOW (ref 8.9–10.3)
Chloride: 105 mmol/L (ref 98–111)
Creatinine, Ser: 2.49 mg/dL — ABNORMAL HIGH (ref 0.44–1.00)
GFR calc Af Amer: 22 mL/min — ABNORMAL LOW (ref >60)
GFR calc non Af Amer: 19 mL/min — ABNORMAL LOW (ref >60)
Glucose, Bld: 271 mg/dL — ABNORMAL HIGH (ref 70–99)
Potassium: 4.1 mmol/L (ref 3.5–5.1)
Sodium: 139 mmol/L (ref 135–145)
Total Bilirubin: 0.6 mg/dL (ref 0.3–1.2)
Total Protein: 6.8 g/dL (ref 6.5–8.1)

## 2018-11-28 LAB — GLUCOSE, CAPILLARY
Glucose-Capillary: 187 mg/dL — ABNORMAL HIGH (ref 70–99)
Glucose-Capillary: 113 mg/dL — ABNORMAL HIGH (ref 70–99)

## 2018-11-28 LAB — URINALYSIS, ROUTINE W REFLEX MICROSCOPIC
Bacteria, UA: NONE SEEN
Bilirubin Urine: NEGATIVE
Glucose, UA: 50 mg/dL — AB
Hgb urine dipstick: NEGATIVE
Ketones, ur: NEGATIVE mg/dL
Leukocytes,Ua: NEGATIVE
Nitrite: NEGATIVE
Protein, ur: 100 mg/dL — AB
Specific Gravity, Urine: 1.011 (ref 1.005–1.030)
pH: 6 (ref 5.0–8.0)

## 2018-11-28 LAB — RAPID URINE DRUG SCREEN, HOSP PERFORMED
Amphetamines: NOT DETECTED
Barbiturates: NOT DETECTED
Benzodiazepines: NOT DETECTED
Cocaine: NOT DETECTED
Opiates: NOT DETECTED
Tetrahydrocannabinol: NOT DETECTED

## 2018-11-28 LAB — CBG MONITORING, ED: Glucose-Capillary: 258 mg/dL — ABNORMAL HIGH (ref 70–99)

## 2018-11-28 LAB — TSH: TSH: 2.95 u[IU]/mL (ref 0.350–4.500)

## 2018-11-28 LAB — ETHANOL: Alcohol, Ethyl (B): <10 mg/dL (ref <10)

## 2018-11-28 MED ORDER — HYDRALAZINE HCL 20 MG/ML IJ SOLN: 10.0000 mg | INTRAMUSCULAR | Status: DC | PRN

## 2018-11-28 MED ORDER — MONTELUKAST SODIUM 10 MG PO TABS: 10.0000 mg | ORAL_TABLET | Freq: Every day | ORAL | Status: DC

## 2018-11-28 MED ORDER — INSULIN GLARGINE 100 UNIT/ML ~~LOC~~ SOLN
10.0000 [IU] | Freq: Every day | SUBCUTANEOUS | Status: DC
  Administered 2018-11-28: 22:00:00 10 [IU] via SUBCUTANEOUS
  Filled 2018-11-28 (×2): qty 0.1

## 2018-11-28 MED ORDER — POLYETHYLENE GLYCOL 3350 17 GM/SCOOP PO POWD
17.0000 g | Freq: Every day | ORAL | Status: DC | PRN
  Filled 2018-11-28: qty 255

## 2018-11-28 MED ORDER — POLYETHYLENE GLYCOL 3350 17 G PO PACK
17.0000 g | PACK | Freq: Every day | ORAL | Status: DC | PRN
  Administered 2018-11-29: 17 g via ORAL
  Filled 2018-11-28: qty 1

## 2018-11-28 MED ORDER — ACETAMINOPHEN 650 MG RE SUPP: 650.0000 mg | Freq: Four times a day (QID) | RECTAL | Status: DC | PRN

## 2018-11-28 MED ORDER — PANTOPRAZOLE SODIUM 40 MG PO TBEC
40.0000 mg | DELAYED_RELEASE_TABLET | Freq: Every day | ORAL | Status: DC
  Administered 2018-11-29: 40 mg via ORAL
  Filled 2018-11-28: qty 1

## 2018-11-28 MED ORDER — SODIUM CHLORIDE 0.9 % IV SOLN: 250.0000 mL | INTRAVENOUS | Status: DC | PRN

## 2018-11-28 MED ORDER — ONDANSETRON HCL 4 MG/2ML IJ SOLN: 4.0000 mg | Freq: Four times a day (QID) | INTRAMUSCULAR | Status: DC | PRN

## 2018-11-28 MED ORDER — DOCUSATE SODIUM 100 MG PO CAPS
100.0000 mg | ORAL_CAPSULE | Freq: Every morning | ORAL | Status: DC
  Administered 2018-11-29: 100 mg via ORAL
  Filled 2018-11-28: qty 1

## 2018-11-28 MED ORDER — LEVOTHYROXINE SODIUM 25 MCG PO TABS: 25.0000 ug | ORAL_TABLET | Freq: Every day | ORAL | Status: DC

## 2018-11-28 MED ORDER — INSULIN ASPART 100 UNIT/ML ~~LOC~~ SOLN
0.0000 [IU] | Freq: Three times a day (TID) | SUBCUTANEOUS | Status: DC
  Administered 2018-11-29: 1 [IU] via SUBCUTANEOUS

## 2018-11-28 MED ORDER — PRIMIDONE 50 MG PO TABS: 50.0000 mg | ORAL_TABLET | Freq: Every morning | ORAL | Status: DC

## 2018-11-28 MED ORDER — INSULIN DEGLUDEC 100 UNIT/ML ~~LOC~~ SOPN: 20.0000 [IU] | PEN_INJECTOR | Freq: Every day | SUBCUTANEOUS | Status: DC

## 2018-11-28 MED ORDER — DEXTROMETHORPHAN POLISTIREX ER 30 MG/5ML PO SUER: 30.0000 mg | Freq: Every day | ORAL | Status: DC | PRN

## 2018-11-28 MED ORDER — LAMOTRIGINE 25 MG PO TABS
75.0000 mg | ORAL_TABLET | Freq: Two times a day (BID) | ORAL | Status: DC
  Filled 2018-11-28 (×2): qty 3

## 2018-11-28 MED ORDER — HALOPERIDOL 0.5 MG PO TABS: 0.5000 mg | ORAL_TABLET | Freq: Two times a day (BID) | ORAL | Status: DC

## 2018-11-28 MED ORDER — ONDANSETRON HCL 4 MG PO TABS: 4.0000 mg | ORAL_TABLET | Freq: Four times a day (QID) | ORAL | Status: DC | PRN

## 2018-11-28 MED ORDER — ATORVASTATIN CALCIUM 40 MG PO TABS: 40.0000 mg | ORAL_TABLET | Freq: Every day | ORAL | Status: DC

## 2018-11-28 MED ORDER — SODIUM CHLORIDE 0.9% FLUSH: 3.0000 mL | INTRAVENOUS | Status: DC | PRN

## 2018-11-28 MED ORDER — AMLODIPINE BESYLATE 5 MG PO TABS
5.0000 mg | ORAL_TABLET | Freq: Every morning | ORAL | Status: DC
  Administered 2018-11-29: 08:00:00 5 mg via ORAL
  Filled 2018-11-28: qty 1

## 2018-11-28 MED ORDER — ACETAMINOPHEN 325 MG PO TABS: 650.0000 mg | ORAL_TABLET | Freq: Four times a day (QID) | ORAL | Status: DC | PRN

## 2018-11-28 MED ORDER — GABAPENTIN 300 MG PO CAPS: 300.0000 mg | ORAL_CAPSULE | Freq: Two times a day (BID) | ORAL | Status: DC

## 2018-11-28 MED ORDER — CARVEDILOL 3.125 MG PO TABS
6.2500 mg | ORAL_TABLET | Freq: Two times a day (BID) | ORAL | Status: DC
  Administered 2018-11-29: 6.25 mg via ORAL
  Filled 2018-11-28: qty 2

## 2018-11-28 MED ORDER — RISPERIDONE 0.5 MG PO TABS: 0.2500 mg | ORAL_TABLET | Freq: Two times a day (BID) | ORAL | Status: DC

## 2018-11-28 MED ORDER — ENOXAPARIN SODIUM 30 MG/0.3ML ~~LOC~~ SOLN: 30.0000 mg | SUBCUTANEOUS | Status: DC

## 2018-11-28 MED ORDER — SPIRONOLACTONE 25 MG PO TABS: 25.0000 mg | ORAL_TABLET | Freq: Every morning | ORAL | Status: DC

## 2018-11-28 MED ORDER — ASPIRIN EC 81 MG PO TBEC
81.0000 mg | DELAYED_RELEASE_TABLET | Freq: Every morning | ORAL | Status: DC
  Administered 2018-11-29: 81 mg via ORAL
  Filled 2018-11-28 (×2): qty 1

## 2018-11-28 MED ORDER — SODIUM CHLORIDE 0.9% FLUSH
3.0000 mL | Freq: Two times a day (BID) | INTRAVENOUS | Status: DC
  Administered 2018-11-28 – 2018-11-29 (×2): 3 mL via INTRAVENOUS

## 2018-11-28 MED ORDER — ALBUTEROL SULFATE HFA 108 (90 BASE) MCG/ACT IN AERS
1.0000 | INHALATION_SPRAY | Freq: Four times a day (QID) | RESPIRATORY_TRACT | Status: DC | PRN
  Filled 2018-11-28: qty 6.7

## 2018-11-28 NOTE — ED Triage Notes (Signed)
Pt from [facility].  Pt fell at 0400 this morning.  Unwitnessed fall. Bruising to left arm, left hand and chin.  Pt was talking to her counselor with staff in room at 1015, per staff pt stopped talking and "slumped over", became confused and semi conscious.    Pt able to open eyes, move arms slightly. Pt is lethargic

## 2018-11-28 NOTE — ED Provider Notes (Signed)
[facility] EMERGENCY DEPARTMENT Provider Note   CSN: [id number] Arrival date & time: 11/28/18  1129    History   Chief Complaint Chief Complaint  Patient presents with   Fall   Altered Mental Status    HPI [name] is a 68 y.o. female.     HPI Patient presents with acute mental status change.  Coming from nursing home by EMS.  Patient is not able to contribute history.  Level 5 caveat applies.  Per EMS patient had unwitnessed fall around 0400 this morning.  While speaking to staff at 1015 became unresponsive.  Appears to have had multiple similar episodes in the past with no definitive diagnosis. Past Medical History:  Diagnosis Date   Anxiety    Asthma    Back pain    Barrett esophagus    CHF (congestive heart failure) (HCC)    CKD (chronic kidney disease) stage 3, GFR 30-59 ml/min (HCC)    COPD (chronic obstructive pulmonary disease) (HCC)    Depression    Diastolic heart failure (HCC)    Essential hypertension    Fibromyalgia    GERD (gastroesophageal reflux disease)    Gout    History of pericarditis    Hyperlipidemia    Hypothyroid    Major depressive disorder    Memory loss    Nephrolithiasis    2008   Obesity hypoventilation syndrome (HCC)    CPAP   Obstructive sleep apnea    PE (pulmonary embolism)    2010   Peripheral neuropathy    PTSD (post-traumatic stress disorder)    Rheumatoid arthritis (HCC)    Schizophrenia ([place])    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   Steroid dependence    Type 2 diabetes mellitus ([place])    Vitamin D deficiency     Patient Active Problem List   Diagnosis Date Noted   Altered mental state 11/28/2018   Near syncope 11/28/2018   Fall at home 11/28/2018   History of seizure 11/28/2018   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   Pressure injury of skin 09/17/2018   Respiratory failure ([place]) 08/16/2018   Seizure ([place]) 08/16/2018   Acute encephalopathy  07/27/2018   Endotracheally intubated 07/27/2018   Hypothyroidism 07/27/2018   Pleuritic chest pain 07/14/2018   Moderate persistent asthma without complication    Type 2 diabetes mellitus ([place]) 04/19/2018   Acute kidney injury superimposed on chronic kidney disease ([place]) 01/13/2018   Altered mental status    Encephalopathy 10/11/2016   Chest pain 08/12/2016   CKD (chronic kidney disease), stage IV ([place]) 08/12/2016   History of pulmonary embolus (PE) 08/12/2016   Obesity, Class III, BMI 40-49.9 (morbid obesity) ([place]) 08/12/2016   RBBB 08/12/2016   Positive D dimer 08/12/2016   Cerebral infarction ([place]) 11/10/2015   Physical deconditioning 07/01/2011   Weakness 07/01/2011   Diabetic foot ulcer ([place]) 05/19/2011   Polypharmacy 02/09/2011   BACK PAIN WITH RADICULOPATHY 05/25/2010   Type 2 diabetes mellitus with diabetic polyneuropathy, without long-term current use of insulin ([place]) 08/18/2009   Chronic diastolic heart failure ([place]) 04/25/2009   Normocytic anemia 04/14/2009   Sleep apnea 12/11/2008   Diabetic peripheral neuropathy ([place]) 05/04/2007   HYPERCHOLESTEROLEMIA 10/13/2006   Gout, unspecified 10/13/2006   HYPOKALEMIA 10/13/2006   Schizophrenia ([place]) 10/13/2006   Depression 10/13/2006   Essential hypertension 10/13/2006   Venous (peripheral) insufficiency 10/13/2006   RHINITIS, ALLERGIC 10/13/2006   Asthma 10/13/2006   Reflux esophagitis 10/13/2006   OSTEOARTHRITIS, MULTI SITES 10/13/2006  INCONTINENCE, URGE 10/13/2006    Past Surgical History:  Procedure Laterality Date   APPENDECTOMY     CHOLECYSTECTOMY     COLONOSCOPY     benign polyps (12/2000), repeat colonoscopy performed in 2007   ENDOMETRIAL BIOPSY     10/03 benign columnar mucosa (limited material)   KIDNEY SURGERY       OB History   No obstetric history on file.      Home Medications    Prior to Admission medications   Medication Sig Start Date End Date  Taking? Authorizing Provider  acetaminophen (TYLENOL) 325 MG tablet Take 325 mg by mouth 2 (two) times daily.     [provider]  albuterol (PROVENTIL) (2.5 MG/3ML) 0.083% nebulizer solution Take 2.5 mg by nebulization every 6 (six) hours as needed for wheezing.     [provider]  albuterol (VENTOLIN HFA) 108 (90 Base) MCG/ACT inhaler Inhale 1-2 puffs into the lungs every 6 (six) hours as needed for wheezing or shortness of breath.    [provider]  amLODipine (NORVASC) 5 MG tablet Take 1 tablet (5 mg total) by mouth daily. 07/31/18 07/31/19  GhimireHenreitta Leber, MD  aspirin 81 MG tablet Take 81 mg by mouth daily.    [provider]  atorvastatin (LIPITOR) 40 MG tablet Take 40 mg by mouth at bedtime.    [provider]  carvedilol (COREG) 6.25 MG tablet Take 1 tablet (6.25 mg total) by mouth 2 (two) times daily with a meal. 08/18/18   Lavina Hamman, MD  Cholecalciferol (VITAMIN D3) 50 MCG (2000 UT) TABS Take 4,000 Units by mouth daily.    [provider]  docusate sodium (COLACE) 100 MG capsule Take 100 mg by mouth daily.     [provider]  Dulaglutide (TRULICITY) 1.5 ZO/1.0RU SOPN Inject 1.5 mg into the skin once a week.    [provider]  EPINEPHrine (EPI-PEN) 0.3 mg/0.3 mL DEVI Inject 0.3 mg into the muscle once as needed (for bee stings, then go to the ED immediately after using).     [provider]  gabapentin (NEURONTIN) 300 MG capsule Take 300 mg by mouth 2 (two) times daily.  08/28/18   [provider]  glipiZIDE (GLUCOTROL XL) 5 MG 24 hr tablet Take 5 mg by mouth 2 (two) times daily.     [provider]  Glycerin, Laxative, (FLEET LIQUID GLYCERIN SUPP RE) Place 1 suppository rectally daily as needed (for constipation).    [provider]  haloperidol (HALDOL) 0.5 MG tablet Take 1 tablet (0.5 mg total) by mouth 2 (two) times daily. 07/31/18   Ghimire, Henreitta Leber, MD    hydrocortisone 2.5 % ointment Apply 1 application topically daily as needed (itchy skin).    [provider]  insulin degludec (TRESIBA FLEXTOUCH) 100 UNIT/ML SOPN FlexTouch Pen Inject 20 Units into the skin at bedtime.    [provider]  lamoTRIgine (LAMICTAL) 25 MG tablet One tablet twice a day for 2 weeks, then take 2 tablets twice a day for 2 weeks, then take 3 tablets twice a day 10/19/18   Kathrynn Ducking, MD  levothyroxine (SYNTHROID, LEVOTHROID) 25 MCG tablet Take 1 tablet (25 mcg total) by mouth daily at 6 (six) AM. 08/19/18   Lavina Hamman, MD  linagliptin (TRADJENTA) 5 MG TABS tablet Take 5 mg by mouth daily.    [provider]  loperamide (IMODIUM) 2 MG capsule Take 2 mg by mouth 3 (three)  times daily as needed (for diarrhea).     [provider]  montelukast (SINGULAIR) 10 MG tablet Take 10 mg by mouth at bedtime.    [provider]  Multiple Vitamin (DAILY VITE PO) Take 1 tablet by mouth daily.    [provider]  nystatin (MYCOSTATIN) 100000 UNIT/ML suspension Take 5 mLs by mouth 3 (three) times daily. 10 day course starting on 09/06/2018    [provider]  omeprazole (PRILOSEC) 20 MG capsule Take 20 mg by mouth daily.     [provider]  OVER THE COUNTER MEDICATION CPAP    [provider]  phenazopyridine (PYRIDIUM) 100 MG tablet Take 200 mg by mouth 3 (three) times daily as needed for pain.    [provider]  polyethylene glycol powder (GLYCOLAX/MIRALAX) powder Take 17 g by mouth daily as needed (for constipation).    [provider]  pyridOXINE (VITAMIN B-6) 100 MG tablet Take 200 mg by mouth 2 (two) times daily.    [provider]  risperiDONE (RISPERDAL) 0.5 MG tablet Take 1 tablet (0.5 mg total) by mouth 2 (two) times daily for 30 days. Please give 0.5mg  in am and 1mg  at bedtime. 09/19/18 10/19/18  Manuella Ghazi, Pratik D, DO  spironolactone (ALDACTONE) 25 MG tablet Take 1 tablet  (25 mg total) by mouth daily. 04/12/18   Mesner, Corene Cornea, MD  torsemide (DEMADEX) 20 MG tablet Take 60 mg alternating with 40 mg daily. Patient taking differently: Take 40-60 mg by mouth See admin instructions. 60 mg by mouth every other day: alternating with 40 mg by mouth every other day 07/26/18   Erma Heritage, PA-C    Family History Family History  Problem Relation Age of Onset   Bone cancer Mother    Hypertension Mother    Heart disease Mother    COPD Father    Heart disease Father    Stroke Father     Social History Social History   Tobacco Use   Smoking status: Former Smoker    Types: Cigarettes    Last attempt to quit: 06/23/1972    Years since quitting: 46.4   Smokeless tobacco: Never Used  Substance Use Topics   Alcohol use: No   Drug use: No     Allergies   Benztropine mesylate; Codeine; Diazepam; Diphenhydramine hcl; Penicillins; Sulfonamide derivatives; Thioridazine hcl; Benztropine; Penicillin g; Lisinopril; and Sulfamethoxazole   Review of Systems Review of Systems  Unable to perform ROS: Mental status change     Physical Exam Updated Vital Signs BP (!) 141/79    Pulse 72    Temp 98.1 F (36.7 C) (Oral)    Resp (!) 23    SpO2 96%   Physical Exam Vitals signs and nursing note reviewed.  Constitutional:      Appearance: Normal appearance. She is well-developed.  HENT:     Head: Normocephalic and atraumatic.     Comments: Patient has bruising to the right jaw.  Midface is stable.  No obvious scalp contusion.    Nose: Nose normal.  Eyes:     Comments: Pinpoint pupils bilaterally.  Neck:     Musculoskeletal: Normal range of motion and neck supple.  Cardiovascular:     Rate and Rhythm: Normal rate and regular rhythm.     Heart sounds: No murmur. No friction rub. No gallop.   Pulmonary:     Effort: Pulmonary effort is normal. No respiratory distress.     Breath sounds: Normal breath sounds. No  stridor. No wheezing, rhonchi or rales.    Chest:     Chest wall: No tenderness.  Abdominal:     General: Bowel sounds are normal.     Palpations: Abdomen is soft.     Tenderness: There is no abdominal tenderness. There is no guarding or rebound.  Musculoskeletal: Normal range of motion.        General: No tenderness.     Right lower leg: Edema present.     Left lower leg: Edema present.     Comments: 1+ pitting edema bilateral lower extremities.  Mild erythema bilaterally likely due to venous insufficiency.  Distal pulses are 2+. Contusion noted to the dorsum of the left hand.  Skin:    General: Skin is warm and dry.     Findings: No erythema or rash.  Neurological:     Mental Status: She is alert.     Comments: Patient opens eyes to voice.  She is not following commands.      ED Treatments / Results  Labs (all labs ordered are listed, but only abnormal results are displayed) Labs Reviewed  CBC WITH DIFFERENTIAL/PLATELET - Abnormal; Notable for the following components:      Result Value   RBC 2.93 (*)    Hemoglobin 9.0 (*)    HCT 28.4 (*)    Neutro Abs 8.8 (*)    Lymphs Abs 0.6 (*)    Abs Immature Granulocytes 0.08 (*)    All other components within normal limits  COMPREHENSIVE METABOLIC PANEL - Abnormal; Notable for the following components:   Glucose, Bld 271 (*)    BUN 41 (*)    Creatinine, Ser 2.49 (*)    Calcium 8.5 (*)    Albumin 3.4 (*)    GFR calc non Af Amer 19 (*)    GFR calc Af Amer 22 (*)    All other components within normal limits  URINALYSIS, ROUTINE W REFLEX MICROSCOPIC - Abnormal; Notable for the following components:   Glucose, UA 50 (*)    Protein, ur 100 (*)    All other components within normal limits  CBG MONITORING, ED - Abnormal; Notable for the following components:   Glucose-Capillary 258 (*)    All other components within normal limits  RAPID URINE DRUG SCREEN, HOSP PERFORMED  ETHANOL  TSH    EKG EKG Interpretation  Date/Time:  Tuesday November 28 2018 11:30:20  EDT Ventricular Rate:  79 PR Interval:    QRS Duration: 176 QT Interval:  451 QTC Calculation: 518 R Axis:   -55 Text Interpretation:  Sinus or ectopic atrial rhythm RBBB and LAFB Left ventricular hypertrophy Confirmed by Julianne Rice [phone]) on 11/28/2018 12:01:26 PM   Radiology Dg Chest 1 View  Result Date: 11/28/2018 CLINICAL DATA:  Unwitnessed fall. EXAM: CHEST  1 VIEW COMPARISON:  09/18/2018 FINDINGS: The patient is mildly rotated to the right. The cardiac silhouette appears mildly enlarged although it is accentuated by AP technique and low lung volumes. No airspace consolidation, edema, pleural effusion, pneumothorax is identified. No acute osseous abnormality is seen. IMPRESSION: Low lung volumes without evidence of acute airspace disease. Electronically Signed   By: Logan Bores M.D.   On: 11/28/2018 12:17   Ct Head Wo Contrast  Result Date: 11/28/2018 CLINICAL DATA:  68 year old female with history of unwitnessed fall. Bruising in the left arm, left hand and chin. Altered consciousness. EXAM: CT HEAD WITHOUT CONTRAST CT CERVICAL SPINE WITHOUT CONTRAST TECHNIQUE: Multidetector CT imaging of the head and  cervical spine was performed following the standard protocol without intravenous contrast. Multiplanar CT image reconstructions of the cervical spine were also generated. COMPARISON:  Head CT a 09/15/2018.  MRI of the brain 09/15/2018. FINDINGS: CT HEAD FINDINGS Brain: Mild cerebral atrophy. Patchy areas of mild decreased attenuation are noted throughout the deep and periventricular white matter of the cerebral hemispheres bilaterally, compatible with mild chronic microvascular ischemic disease. No evidence of acute infarction, hemorrhage, hydrocephalus, extra-axial collection or mass lesion/mass effect. Vascular: No hyperdense vessel or unexpected calcification. Skull: Normal. Negative for fracture or focal lesion. Sinuses/Orbits: No acute finding. Multifocal mucosal thickening throughout  the maxillary sinuses bilaterally, as well as a 1.8 cm low-attenuation lesion in the left maxillary sinus, compatible with a small mucosal retention cyst or polyp. Other: None. CT CERVICAL SPINE FINDINGS Alignment: Normal. Skull base and vertebrae: No acute fracture. No primary bone lesion or focal pathologic process. Soft tissues and spinal canal: No prevertebral fluid or swelling. No visible canal hematoma. Disc levels: Mild multilevel degenerative disc disease, most apparent at C5-C6 and C6-C7. Mild multilevel facet arthropathy. Upper chest: Unremarkable. Other: None. IMPRESSION: 1. No no acute intracranial abnormality. 2. No findings to suggest significant acute traumatic injury to the cervical spine. 3. Mild cerebral atrophy with mild chronic microvascular ischemic changes in the cerebral white matter, similar to prior examinations. 4. Additional incidental findings, as above. Electronically Signed   By: Vinnie Langton M.D.   On: 11/28/2018 12:10   Ct Cervical Spine Wo Contrast  Result Date: 11/28/2018 CLINICAL DATA:  68 year old female with history of unwitnessed fall. Bruising in the left arm, left hand and chin. Altered consciousness. EXAM: CT HEAD WITHOUT CONTRAST CT CERVICAL SPINE WITHOUT CONTRAST TECHNIQUE: Multidetector CT imaging of the head and cervical spine was performed following the standard protocol without intravenous contrast. Multiplanar CT image reconstructions of the cervical spine were also generated. COMPARISON:  Head CT a 09/15/2018.  MRI of the brain 09/15/2018. FINDINGS: CT HEAD FINDINGS Brain: Mild cerebral atrophy. Patchy areas of mild decreased attenuation are noted throughout the deep and periventricular white matter of the cerebral hemispheres bilaterally, compatible with mild chronic microvascular ischemic disease. No evidence of acute infarction, hemorrhage, hydrocephalus, extra-axial collection or mass lesion/mass effect. Vascular: No hyperdense vessel or unexpected  calcification. Skull: Normal. Negative for fracture or focal lesion. Sinuses/Orbits: No acute finding. Multifocal mucosal thickening throughout the maxillary sinuses bilaterally, as well as a 1.8 cm low-attenuation lesion in the left maxillary sinus, compatible with a small mucosal retention cyst or polyp. Other: None. CT CERVICAL SPINE FINDINGS Alignment: Normal. Skull base and vertebrae: No acute fracture. No primary bone lesion or focal pathologic process. Soft tissues and spinal canal: No prevertebral fluid or swelling. No visible canal hematoma. Disc levels: Mild multilevel degenerative disc disease, most apparent at C5-C6 and C6-C7. Mild multilevel facet arthropathy. Upper chest: Unremarkable. Other: None. IMPRESSION: 1. No no acute intracranial abnormality. 2. No findings to suggest significant acute traumatic injury to the cervical spine. 3. Mild cerebral atrophy with mild chronic microvascular ischemic changes in the cerebral white matter, similar to prior examinations. 4. Additional incidental findings, as above. Electronically Signed   By: Vinnie Langton M.D.   On: 11/28/2018 12:10   Dg Hand Complete Left  Result Date: 11/28/2018 CLINICAL DATA:  68 year old female with a fall EXAM: LEFT HAND - COMPLETE 3+ VIEW COMPARISON:  06/03/2018 FINDINGS: Osteopenia. No acute displaced fracture. No radiopaque foreign body. Degenerative changes of the interphalangeal joints. No subluxation/dislocation. No erosive changes. Degenerative  changes of the first carpometacarpal joint. IMPRESSION: Osteopenia, with no acute bony abnormality identified. Osteoarthritis Electronically Signed   By: Corrie Mckusick D.O.   On: 11/28/2018 12:15    Procedures Procedures (including critical care time)  Medications Ordered in ED Medications - No data to display   Initial Impression / Assessment and Plan / ED Course  I have reviewed the triage vital signs and the nursing notes.  Pertinent labs & imaging results that were  available during my care of the patient were reviewed by me and considered in my medical decision making (see chart for details).        Vital signs are stable.  Will evaluate for acute head trauma.  Suspect patient is having another catatonic episode which may be related to her underlying psychiatric disorder. CT head without acute findings.  Patient's drug screen is normal.  No definite organic cause for the patient's current state.  Vital signs remained stable.  Discussed with hospitalist who will admit patient for observation.  May benefit from neurologic consult. Final Clinical Impressions(s) / ED Diagnoses   Final diagnoses:  Altered mental status, unspecified altered mental status type    ED Discharge Orders    None       Julianne Rice, MD 11/28/18 1505

## 2018-11-28 NOTE — H&P (Addendum)
Observation History and Physical  Tiffony Kite PVV:[id number] DOB: Jan 17, 1951 DOA: 11/28/2018  Referring physician: Lita Mains MD  PCP: Glenda Chroman, MD   Chief Complaint: altered mental status  HPI: Samreen Seltzer is a 68 y.o. female with a complex psychiatric history and has had multiple recent hospitalizations, subspecialty consults and visits in the healthcare system for recurrent near syncopal episodes and catatonic spells where a definite cause has never been fully found or described. She is followed by [name] as her neurologist.  She recently saw him last month.  She is apparently being treated for a history of epilepsy since age 67 but [name] is not convinced that she has epilepsy.  She had been started on Keppra couple months ago and [name] has recently gotten her off Keppra and started her on Lamictal for its mood stabilization properties.  She has had 2 recent normal EEG studies.  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  She has had multiple MRIs imaging of the brain that have been mostly unremarkable with the exception of minimal white matter changes.  She has had multiple hospitalizations.  She was diagnosed with a stroke in December 2019 but there was no MRI evidence of acute CVA.  She has had full stroke work-up recently.  The patient resides at [facility] and had an unwitnessed fall at 4 AM.  She was noted to have some bruising to the left arm and left hand and chin.  She apparently was speaking with her counselors at around 10 AM this morning and developed an acute episode where she stopped talking and "slumped over" and became confused and came close to losing consciousness.  This is similar to multiple episodes that she has had in the past where she has become catatonic and this lasted for several hours and then she returns  to her baseline.  These episodes have been present since she was a child.  She has been evaluated by psychiatry.  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  The patient is unable to give history but apparently has been taking a course of Levaquin for 10 days according to nursing home records.  It is not sure if she is being treated for UTI or pneumonia as her urine and chest x-ray appear normal.  ED course: The patient was evaluated in the ED and had normal alcohol level negative drug screen.  The patient had imaging of the chest and hand with no acute findings.  The patient had a CT of cervical spine and CT of the head with no acute findings.  Urinalysis negative for active infection.  Blood sugar 271.  Creatinine 2.49.  White blood cell count 9.9 and hemoglobin 9.0 which are similar to baseline labs.  TSH was normal at 2.950.  The patient was monitored for several hours in the emergency department and has remained in a catatonic state.  Observation in the hospital was requested for consideration of a neurology consultation and further work-up as indicated.   Review of Systems: Unable to obtain due to patient's current condition.  Past Medical History:  Diagnosis Date   Anxiety    Asthma    Back pain    Barrett esophagus    CHF (congestive heart failure) (HCC)    CKD (chronic kidney disease) stage 3, GFR 30-59 ml/min (HCC)  COPD (chronic obstructive pulmonary disease) (HCC)    Depression    Diastolic heart failure (HCC)    Essential hypertension    Fibromyalgia    GERD (gastroesophageal reflux disease)    Gout    History of pericarditis    Hyperlipidemia    Hypothyroid    Major depressive disorder    Memory loss    Nephrolithiasis    2008   Obesity hypoventilation syndrome (HCC)    CPAP   Obstructive sleep apnea    PE (pulmonary embolism)    2010   Peripheral neuropathy    PTSD (post-traumatic  stress disorder)    Rheumatoid arthritis ([place])    Schizophrenia ([place])    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 Her smoking use included cigarettes. She has never used smokeless tobacco. She reports that she does not drink alcohol or use drugs.  Allergies  Allergen Reactions   Benztropine Mesylate Other (See Comments)    Alopecia and white spots on eyes   Codeine Swelling, Palpitations and Other (See Comments)    Mouth Swelling and Tachycardia    Diazepam Other (See Comments)    COMA   Diphenhydramine Hcl Anxiety, Palpitations and Other (See Comments)    Tachycardia and anxiousness   Penicillins Other (See Comments)    REACTION: ulcers in mouth, swelling, throat swelling  Has patient had a PCN reaction causing immediate rash, facial/tongue/throat swelling, SOB or lightheadedness with hypotension: YES Has patient had a PCN reaction causing severe rash involving Mucus membranes or skin necrosis: Unknown Has patient had a PCN reaction that required hospitalization Unknown Has patient had a PCN reaction occurring within the last 10 years: unknown If all of the above answers are "NO", then may proceed with Cephalospori   Sulfonamide Derivatives Rash    Blisters, also   Thioridazine Hcl Swelling   Benztropine Other (See Comments)    "Alopecia and white spots on eyes"   Penicillin G Swelling and Other (See Comments)    Ulcers in mouth & tongue became swollen Has patient had a PCN reaction causing immediate rash, facial/tongue/throat swelling, SOB or  lightheadedness with hypotension: Yes Has patient had a PCN reaction causing severe rash involving mucus membranes or skin necrosis: Unk Has patient had a PCN reaction that required hospitalization: Unk Has patient had a PCN reaction occurring within the last 10 years: Unk If all of the above answers are "NO", then may proceed with Cephalosporin use.    Lisinopril Cough   Sulfamethoxazole Rash    Family History  Problem Relation Age of Onset   Bone cancer Mother    Hypertension Mother    Heart disease Mother    COPD Father    Heart disease Father    Stroke Father     Prior to Admission medications   Medication Sig Start Date End Date Taking? Authorizing Provider  acetaminophen (TYLENOL) 325 MG tablet Take 325 mg by mouth 2 (two) times daily.     [provider]  albuterol (  PROVENTIL) (2.5 MG/3ML) 0.083% nebulizer solution Take 2.5 mg by nebulization every 6 (six) hours as needed for wheezing.     [provider]  albuterol (VENTOLIN HFA) 108 (90 Base) MCG/ACT inhaler Inhale 1-2 puffs into the lungs every 6 (six) hours as needed for wheezing or shortness of breath.    [provider]  amLODipine (NORVASC) 5 MG tablet Take 1 tablet (5 mg total) by mouth daily. 07/31/18 07/31/19  GhimireHenreitta Leber, MD  aspirin 81 MG tablet Take 81 mg by mouth daily.    [provider]  atorvastatin (LIPITOR) 40 MG tablet Take 40 mg by mouth at bedtime.    [provider]  carvedilol (COREG) 6.25 MG tablet Take 1 tablet (6.25 mg total) by mouth 2 (two) times daily with a meal. 08/18/18   Lavina Hamman, MD  Cholecalciferol (VITAMIN D3) 50 MCG (2000 UT) TABS Take 4,000 Units by mouth daily.    [provider]  docusate sodium (COLACE) 100 MG capsule Take 100 mg by mouth daily.     [provider]  Dulaglutide (TRULICITY) 1.5 AC/1.6SA SOPN Inject 1.5 mg into the skin once a week.    [provider]  EPINEPHrine (EPI-PEN) 0.3  mg/0.3 mL DEVI Inject 0.3 mg into the muscle once as needed (for bee stings, then go to the ED immediately after using).     [provider]  gabapentin (NEURONTIN) 300 MG capsule Take 300 mg by mouth 2 (two) times daily.  08/28/18   [provider]  glipiZIDE (GLUCOTROL XL) 5 MG 24 hr tablet Take 5 mg by mouth 2 (two) times daily.     [provider]  Glycerin, Laxative, (FLEET LIQUID GLYCERIN SUPP RE) Place 1 suppository rectally daily as needed (for constipation).    [provider]  haloperidol (HALDOL) 0.5 MG tablet Take 1 tablet (0.5 mg total) by mouth 2 (two) times daily. 07/31/18   Ghimire, Henreitta Leber, MD  hydrocortisone 2.5 % ointment Apply 1 application topically daily as needed (itchy skin).    [provider]  insulin degludec (TRESIBA FLEXTOUCH) 100 UNIT/ML SOPN FlexTouch Pen Inject 20 Units into the skin at bedtime.    [provider]  lamoTRIgine (LAMICTAL) 25 MG tablet One tablet twice a day for 2 weeks, then take 2 tablets twice a day for 2 weeks, then take 3 tablets twice a day 10/19/18   Kathrynn Ducking, MD  levothyroxine (SYNTHROID, LEVOTHROID) 25 MCG tablet Take 1 tablet (25 mcg total) by mouth daily at 6 (six) AM. 08/19/18   Lavina Hamman, MD  linagliptin (TRADJENTA) 5 MG TABS tablet Take 5 mg by mouth daily.    [provider]  loperamide (IMODIUM) 2 MG capsule Take 2 mg by mouth 3 (three) times daily as needed (for diarrhea).     [provider]  montelukast (SINGULAIR) 10 MG tablet Take 10 mg by mouth at bedtime.    [provider]  Multiple Vitamin (DAILY VITE PO) Take 1 tablet by mouth daily.    [provider]  nystatin (MYCOSTATIN) 100000 UNIT/ML suspension Take 5 mLs by mouth 3 (three) times daily. 10 day course starting on 09/06/2018    [provider]  omeprazole (PRILOSEC) 20 MG capsule Take 20 mg by mouth daily.     [provider]  OVER THE COUNTER MEDICATION  CPAP    [provider]  phenazopyridine (PYRIDIUM) 100 MG tablet Take 200 mg by mouth 3 (three)  times daily as needed for pain.    [provider]  polyethylene glycol powder (GLYCOLAX/MIRALAX) powder Take 17 g by mouth daily as needed (for constipation).    [provider]  pyridOXINE (VITAMIN B-6) 100 MG tablet Take 200 mg by mouth 2 (two) times daily.    [provider]  risperiDONE (RISPERDAL) 0.5 MG tablet Take 1 tablet (0.5 mg total) by mouth 2 (two) times daily for 30 days. Please give 0.5mg  in am and 1mg  at bedtime. 09/19/18 10/19/18  Manuella Ghazi, Pratik D, DO  spironolactone (ALDACTONE) 25 MG tablet Take 1 tablet (25 mg total) by mouth daily. 04/12/18   Mesner, Corene Cornea, MD  torsemide (DEMADEX) 20 MG tablet Take 60 mg alternating with 40 mg daily. Patient taking differently: Take 40-60 mg by mouth See admin instructions. 60 mg by mouth every other day: alternating with 40 mg by mouth every other day 07/26/18   Erma Heritage, PA-C   Physical Exam: Vitals:   11/28/18 1300 11/28/18 1330 11/28/18 1400 11/28/18 1430  BP: 131/74 (!) 154/79 (!) 142/80 (!) 141/79  Pulse: 72 73 74 72  Resp: 20 20 10  (!) 23  Temp:      TempSrc:      SpO2: 95% 100% 100% 96%     General exam: Pt nonverbal and appears in catatonic state. She is lethargic, but arousable.   Head, eyes and ENT: bruising on chin, normocephalic. Pupils equally reacting to light and accommodation. Oral mucosa moist.  Neck: Supple. No JVD, carotid bruit or thyromegaly.  Lymphatics: No lymphadenopathy.  Respiratory system: BBS. No increased work of breathing.  Cardiovascular system: normal S1 and S2 heard.   Gastrointestinal system: Abdomen is nondistended, soft and nontender. Normal bowel sounds heard. No organomegaly or masses appreciated.  Central nervous system: Alert and oriented. No focal neurological deficits.  Extremities: Symmetric 5 x 5 power. Peripheral pulses symmetrically felt.    Skin: No rashes or acute findings.  Musculoskeletal system: edema left arm and left hand. 1+ pretibial edema BLEs.   Psychiatry: Flat. Catatonic.  Labs on Admission:  Basic Metabolic Panel: Recent Labs  Lab 11/28/18 1147  NA 139  K 4.1  CL 105  CO2 24  GLUCOSE 271*  BUN 41*  CREATININE 2.49*  CALCIUM 8.5*   Liver Function Tests: Recent Labs  Lab 11/28/18 1147  AST 21  ALT 23  ALKPHOS 89  BILITOT 0.6  PROT 6.8  ALBUMIN 3.4*   No results for input(s): LIPASE, AMYLASE in the last 168 hours. No results for input(s): AMMONIA in the last 168 hours. CBC: Recent Labs  Lab 11/28/18 1147  WBC 9.9  NEUTROABS 8.8*  HGB 9.0*  HCT 28.4*  MCV 96.9  PLT 219   Cardiac Enzymes: No results for input(s): CKTOTAL, CKMB, CKMBINDEX, TROPONINI in the last 168 hours.  BNP (last 3 results) No results for input(s): PROBNP in the last 8760 hours. CBG: Recent Labs  Lab 11/28/18 1136  GLUCAP 258*    Radiological Exams on Admission: Dg Chest 1 View  Result Date: 11/28/2018 CLINICAL DATA:  Unwitnessed fall. EXAM: CHEST  1 VIEW COMPARISON:  09/18/2018 FINDINGS: The patient is mildly rotated to the right. The cardiac silhouette appears mildly enlarged although it is accentuated by AP technique and low lung volumes. No airspace consolidation, edema, pleural effusion, pneumothorax is identified. No acute osseous abnormality is seen. IMPRESSION: Low lung volumes without evidence of acute airspace disease. Electronically Signed   By: Seymour Bars.D.  On: 11/28/2018 12:17   Ct Head Wo Contrast  Result Date: 11/28/2018 CLINICAL DATA:  68 year old female with history of unwitnessed fall. Bruising in the left arm, left hand and chin. Altered consciousness. EXAM: CT HEAD WITHOUT CONTRAST CT CERVICAL SPINE WITHOUT CONTRAST TECHNIQUE: Multidetector CT imaging of the head and cervical spine was performed following the standard protocol without intravenous contrast. Multiplanar CT image  reconstructions of the cervical spine were also generated. COMPARISON:  Head CT a 09/15/2018.  MRI of the brain 09/15/2018. FINDINGS: CT HEAD FINDINGS Brain: Mild cerebral atrophy. Patchy areas of mild decreased attenuation are noted throughout the deep and periventricular white matter of the cerebral hemispheres bilaterally, compatible with mild chronic microvascular ischemic disease. No evidence of acute infarction, hemorrhage, hydrocephalus, extra-axial collection or mass lesion/mass effect. Vascular: No hyperdense vessel or unexpected calcification. Skull: Normal. Negative for fracture or focal lesion. Sinuses/Orbits: No acute finding. Multifocal mucosal thickening throughout the maxillary sinuses bilaterally, as well as a 1.8 cm low-attenuation lesion in the left maxillary sinus, compatible with a small mucosal retention cyst or polyp. Other: None. CT CERVICAL SPINE FINDINGS Alignment: Normal. Skull base and vertebrae: No acute fracture. No primary bone lesion or focal pathologic process. Soft tissues and spinal canal: No prevertebral fluid or swelling. No visible canal hematoma. Disc levels: Mild multilevel degenerative disc disease, most apparent at C5-C6 and C6-C7. Mild multilevel facet arthropathy. Upper chest: Unremarkable. Other: None. IMPRESSION: 1. No no acute intracranial abnormality. 2. No findings to suggest significant acute traumatic injury to the cervical spine. 3. Mild cerebral atrophy with mild chronic microvascular ischemic changes in the cerebral white matter, similar to prior examinations. 4. Additional incidental findings, as above. Electronically Signed   By: Vinnie Langton M.D.   On: 11/28/2018 12:10   Ct Cervical Spine Wo Contrast  Result Date: 11/28/2018 CLINICAL DATA:  68 year old female with history of unwitnessed fall. Bruising in the left arm, left hand and chin. Altered consciousness. EXAM: CT HEAD WITHOUT CONTRAST CT CERVICAL SPINE WITHOUT CONTRAST TECHNIQUE: Multidetector CT  imaging of the head and cervical spine was performed following the standard protocol without intravenous contrast. Multiplanar CT image reconstructions of the cervical spine were also generated. COMPARISON:  Head CT a 09/15/2018.  MRI of the brain 09/15/2018. FINDINGS: CT HEAD FINDINGS Brain: Mild cerebral atrophy. Patchy areas of mild decreased attenuation are noted throughout the deep and periventricular white matter of the cerebral hemispheres bilaterally, compatible with mild chronic microvascular ischemic disease. No evidence of acute infarction, hemorrhage, hydrocephalus, extra-axial collection or mass lesion/mass effect. Vascular: No hyperdense vessel or unexpected calcification. Skull: Normal. Negative for fracture or focal lesion. Sinuses/Orbits: No acute finding. Multifocal mucosal thickening throughout the maxillary sinuses bilaterally, as well as a 1.8 cm low-attenuation lesion in the left maxillary sinus, compatible with a small mucosal retention cyst or polyp. Other: None. CT CERVICAL SPINE FINDINGS Alignment: Normal. Skull base and vertebrae: No acute fracture. No primary bone lesion or focal pathologic process. Soft tissues and spinal canal: No prevertebral fluid or swelling. No visible canal hematoma. Disc levels: Mild multilevel degenerative disc disease, most apparent at C5-C6 and C6-C7. Mild multilevel facet arthropathy. Upper chest: Unremarkable. Other: None. IMPRESSION: 1. No no acute intracranial abnormality. 2. No findings to suggest significant acute traumatic injury to the cervical spine. 3. Mild cerebral atrophy with mild chronic microvascular ischemic changes in the cerebral white matter, similar to prior examinations. 4. Additional incidental findings, as above. Electronically Signed   By: Vinnie Langton M.D.   On:  11/28/2018 12:10   Dg Hand Complete Left  Result Date: 11/28/2018 CLINICAL DATA:  68 year old female with a fall EXAM: LEFT HAND - COMPLETE 3+ VIEW COMPARISON:   06/03/2018 FINDINGS: Osteopenia. No acute displaced fracture. No radiopaque foreign body. Degenerative changes of the interphalangeal joints. No subluxation/dislocation. No erosive changes. Degenerative changes of the first carpometacarpal joint. IMPRESSION: Osteopenia, with no acute bony abnormality identified. Osteoarthritis Electronically Signed   By: Corrie Mckusick D.O.   On: 11/28/2018 12:15   Assessment/Plan Active Problems:   Type 2 diabetes mellitus with diabetic polyneuropathy, without long-term current use of insulin (HCC)   Diabetic peripheral neuropathy (HCC)   HYPERCHOLESTEROLEMIA   Schizophrenia ([place])   Essential hypertension   Chronic diastolic heart failure (HCC)   Venous (peripheral) insufficiency   Reflux esophagitis   Sleep apnea   CKD (chronic kidney disease), stage IV (HCC)   Altered mental status   Acute encephalopathy   Hypothyroidism   Altered mental state   Near syncope   Fall at home   History of seizure  1. Near syncopal episode with altered mentation - Pt appears catatonic and this episode appears to be similar to the many other episodes that she has presented with for multiple other admissions.  No clear cause has been found but they tend to resolve after several hours.  I have requested another EEG in case she is post-ictal.  Her neurologist is not convinced that she has epilepsy.  I did not order another MRI at this time given stable CT brain results as she has had many brain MRI studies done before and they have been unremarkable.  Continue neuro checks and continuous telemetry monitoring.  She has had recent echocardiogram and carotid doppler studies and did not re-order them.  Obtain neuro consultation to see if any more evaluation needs to be added to complete work up.   2. Frequent falls - Obtain orthostatic vitals and PT evaluation.   3. Acute encephalopathy - she has been worked up in ED and testing has been unremarkable.  No evidence of infection found.   Continue to monitor as her episodes tend to resolve after several hours. If no resolution would consider MRI brain. Continue neuro checks.  Neurology consult requested.  4. Hypothyroidism -TSH is normal and will resume normal home dose levothyroxine. 5. Essential hypertension-resume amlodipine 5 mg daily. 6. Stage IV CKD- creatinine appears stable from recent tests. 7. Reflux esophagitis-resume proton pump inhibitor therapy. 8. Type 2 diabetes mellitus, insulin requiring with neurologic complications-sliding scale insulin covered with reduced basal insulin ordered.  Monitor CBG. 9. Hyperlipidemia-resume home statin therapy. 10. Chronic diastolic heart failure-patient is not eating or drinking so we will temporarily hold torsemide and spironolactone.   11. Diabetic peripheral neuropathy-Holding gabapentin due to lethargy.   12. Schizoaffective disorder- resume home psychiatric medications with reduced dosing due to her level of lethargy.  The patient will need outpatient follow-up with psychiatry. 13. Epilepsy- her neurologist does not seem convinced that she has this but has gotten her off of [place] and now she is on Lamictal for the additional mood stabilization effects.  We have continued that. She has outpatient follow up with [name].  14. OSA - will offer nightly CPAP while in hospital.  15. Polypharmacy - tried to reduce or hold medications as much as possible until mentation improves.   DVT Prophylaxis: lovenox  Code Status: full   Disposition Plan: PT eval, observation, could discharge tomorrow if improved, stable   Time  spent: 56 minutes   Irwin Brakeman, MD [facility] How to contact the [facility] Attending or Consulting provider [place] or covering provider during after hours [place], for this patient?  1. Check the care team in [facility] and look for a) attending/consulting [facility] provider listed and b) the [facility] team listed 2. Log into [email or web address] and use [place]'s  universal password to access. If you do not have the password, please contact the hospital operator. 3. Locate the [facility] provider you are looking for under [facility] and page to a number that you can be directly reached. 4. If you still have difficulty reaching the provider, please page the [facility] (Director on Call) for the Hospitalists listed on amion for assistance.

## 2018-11-28 NOTE — Progress Notes (Signed)
Unable to stand patient for orthostatic vital signs. Lying BP 146/77. Pulse 75. Sitting BP 169/63. Pulse 73.

## 2018-11-28 NOTE — ED Notes (Addendum)
ED TO INPATIENT HANDOFF REPORT  ED Nurse Name and Phone #: Gaspar Bidding [phone]  S Name/Age/Gender [name] 68 y.o. female Room/Bed: APA04/APA04  Code Status   Code Status: Prior  Home/SNF/Other Nursing Home Patient oriented to: self Is this baseline? No   Triage Complete: Triage complete  Chief Complaint Fall; Altered Mental Status  Triage Note Pt from [facility].  Pt fell at 0400 this morning.  Unwitnessed fall. Bruising to left arm, left hand and chin.  Pt was talking to her counselor with staff in room at 1015, per staff pt stopped talking and "slumped over", became confused and semi conscious.    Pt able to open eyes, move arms slightly. Pt is lethargic   Allergies Allergies  Allergen Reactions  . Benztropine Mesylate Other (See Comments)    Alopecia and white spots on eyes  . Codeine Swelling, Palpitations and Other (See Comments)    Mouth Swelling and Tachycardia   . Diazepam Other (See Comments)    COMA  . Diphenhydramine Hcl Anxiety, Palpitations and Other (See Comments)    Tachycardia and anxiousness  . Penicillins Other (See Comments)    REACTION: ulcers in mouth, swelling, throat swelling  Has patient had a PCN reaction causing immediate rash, facial/tongue/throat swelling, SOB or lightheadedness with hypotension: YES Has patient had a PCN reaction causing severe rash involving Mucus membranes or skin necrosis: Unknown Has patient had a PCN reaction that required hospitalization Unknown Has patient had a PCN reaction occurring within the last 10 years: unknown If all of the above answers are "NO", then may proceed with Cephalospori  . Sulfonamide Derivatives Rash    Blisters, also  . Thioridazine Hcl Swelling  . Benztropine Other (See Comments)    "Alopecia and white spots on eyes"  . Penicillin G Swelling and Other (See Comments)    Ulcers in mouth & tongue became swollen Has patient had a PCN reaction causing immediate rash, facial/tongue/throat  swelling, SOB or lightheadedness with hypotension: Yes Has patient had a PCN reaction causing severe rash involving mucus membranes or skin necrosis: Unk Has patient had a PCN reaction that required hospitalization: Unk Has patient had a PCN reaction occurring within the last 10 years: Unk If all of the above answers are "NO", then may proceed with Cephalosporin use.   [name] Kitchen Lisinopril Cough  . Sulfamethoxazole Rash    Level of Care/Admitting Diagnosis ED Disposition    ED Disposition Condition [facility] Area: [facility] [[id number]]  Level of Care: Telemetry [5]  Diagnosis: Altered mental state [193790]  Admitting Physician: [place], [place]  Attending Physician: Murlean Iba [[id number]]  Possible Covid Disease Patient Isolation: N/A  PT Class (Do Not Modify): Observation [104]  PT Acc Code (Do Not Modify): Observation [10022]       B Medical/Surgery History Past Medical History:  Diagnosis Date  . Anxiety   . Asthma   . Back pain   . Barrett esophagus   . CHF (congestive heart failure) ([place])   . CKD (chronic kidney disease) stage 3, GFR 30-59 ml/min (HCC)   . COPD (chronic obstructive pulmonary disease) ([place])   . Depression   . Diastolic heart failure ([place])   . Essential hypertension   . Fibromyalgia   . GERD (gastroesophageal reflux disease)   . Gout   . History of pericarditis   . Hyperlipidemia   . Hypothyroid   . Major depressive disorder   . Memory loss   .  Nephrolithiasis    2008  . Obesity hypoventilation syndrome (HCC)    CPAP  . Obstructive sleep apnea   . PE (pulmonary embolism)    2010  . Peripheral neuropathy   . PTSD (post-traumatic stress disorder)   . Rheumatoid arthritis ([place])   . Schizophrenia ([place])   . 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 Steroid dependence   . Type 2 diabetes mellitus ([place])   . Vitamin D deficiency    Past Surgical History:  Procedure Laterality Date  . APPENDECTOMY    .  CHOLECYSTECTOMY    . COLONOSCOPY     benign polyps (12/2000), repeat colonoscopy performed in 2007  . ENDOMETRIAL BIOPSY     10/03 benign columnar mucosa (limited material)  . KIDNEY SURGERY       A IV Location/Drains/Wounds Patient Lines/Drains/Airways Status   Active Line/Drains/Airways    Name:   Placement date:   Placement time:   Site:   Days:   Peripheral IV 11/28/18 Left Antecubital   11/28/18    1213    Antecubital   less than 1   External Urinary Catheter   09/15/18    1238    -   74   Pressure Injury 09/15/18 Unstageable - Full thickness tissue loss in which the base of the ulcer is covered by slough (yellow, tan, gray, green or brown) and/or eschar (tan, brown or black) in the wound bed.   09/15/18    1242     74   Wound / Incision (Open or Dehisced) 10/12/16 Diabetic ulcer;Other (Comment) Foot Right Crusted over ulcer on bottom of right foot. Blisters between toes   10/12/16    2045    Foot   777   Wound / Incision (Open or Dehisced) 01/12/18 Other (Comment);Venous stasis ulcer Leg Right;Left reddened,    01/12/18    1811    Leg   320   Wound / Incision (Open or Dehisced) 01/12/18 Venous stasis ulcer Leg Right;Other (Comment) reddened area quarter sized   01/12/18    1811    Leg   320   Wound / Incision (Open or Dehisced) 07/15/18 Diabetic ulcer Foot Right   07/15/18    0800    Foot   136          Intake/Output Last 24 hours  Intake/Output Summary (Last 24 hours) at 11/28/2018 1525 Last data filed at 11/28/2018 1426 Gross per 24 hour  Intake -  Output 800 ml  Net -800 ml    Labs/Imaging Results for orders placed or performed during the hospital encounter of 11/28/18 (from the past 48 hour(s))  CBG monitoring, ED     Status: Abnormal   Collection Time: 11/28/18 11:36 AM  Result Value Ref Range   Glucose-Capillary 258 (H) 70 - 99 mg/dL  CBC with Differential/Platelet     Status: Abnormal   Collection Time: 11/28/18 11:47 AM  Result Value Ref Range   WBC 9.9 4.0 -  10.5 K/uL   RBC 2.93 (L) 3.87 - 5.11 MIL/uL   Hemoglobin 9.0 (L) 12.0 - 15.0 g/dL   HCT 28.4 (L) 36.0 - 46.0 %   MCV 96.9 80.0 - 100.0 fL   MCH 30.7 26.0 - 34.0 pg   MCHC 31.7 30.0 - 36.0 g/dL   RDW 13.6 11.5 - 15.5 %   Platelets 219 150 - 400 K/uL   nRBC 0.0 0.0 - 0.2 %   Neutrophils Relative % 88 %   Neutro Abs  8.8 (H) 1.7 - 7.7 K/uL   Lymphocytes Relative 6 %   Lymphs Abs 0.6 (L) 0.7 - 4.0 K/uL   Monocytes Relative 4 %   Monocytes Absolute 0.4 0.1 - 1.0 K/uL   Eosinophils Relative 1 %   Eosinophils Absolute 0.1 0.0 - 0.5 K/uL   Basophils Relative 0 %   Basophils Absolute 0.0 0.0 - 0.1 K/uL   Immature Granulocytes 1 %   Abs Immature Granulocytes 0.08 (H) 0.00 - 0.07 K/uL    Comment: Performed at [facility], [address] [place], [place] [address]  Comprehensive metabolic panel     Status: Abnormal   Collection Time: 11/28/18 11:47 AM  Result Value Ref Range   Sodium 139 135 - 145 mmol/L   Potassium 4.1 3.5 - 5.1 mmol/L   Chloride 105 98 - 111 mmol/L   CO2 24 22 - 32 mmol/L   Glucose, Bld 271 (H) 70 - 99 mg/dL   BUN 41 (H) 8 - 23 mg/dL   Creatinine, Ser 2.49 (H) 0.44 - 1.00 mg/dL   Calcium 8.5 (L) 8.9 - 10.3 mg/dL   Total Protein 6.8 6.5 - 8.1 g/dL   Albumin 3.4 (L) 3.5 - 5.0 g/dL   AST 21 15 - 41 U/L   ALT 23 0 - 44 U/L   Alkaline Phosphatase 89 38 - 126 U/L   Total Bilirubin 0.6 0.3 - 1.2 mg/dL   GFR calc non Af Amer 19 (L) >60 mL/min   GFR calc Af Amer 22 (L) >60 mL/min   Anion gap 10 5 - 15    Comment: Performed at [facility], [address] [place], [place] [address]  Rapid urine drug screen (hospital performed)     Status: None   Collection Time: 11/28/18 11:47 AM  Result Value Ref Range   Opiates NONE DETECTED NONE DETECTED   Cocaine NONE DETECTED NONE DETECTED   Benzodiazepines NONE DETECTED NONE DETECTED   Amphetamines NONE DETECTED NONE DETECTED   Tetrahydrocannabinol NONE DETECTED NONE DETECTED   Barbiturates NONE DETECTED NONE DETECTED     Comment: (NOTE) DRUG SCREEN FOR MEDICAL PURPOSES ONLY.  IF CONFIRMATION IS NEEDED FOR ANY PURPOSE, NOTIFY LAB WITHIN 5 DAYS. LOWEST DETECTABLE LIMITS FOR URINE DRUG SCREEN Drug Class                     Cutoff (ng/mL) Amphetamine and metabolites    1000 Barbiturate and metabolites    200 Benzodiazepine                 440 Tricyclics and metabolites     300 Opiates and metabolites        300 Cocaine and metabolites        300 THC                            50 Performed at [facility], [address] [place], [place] [address]   Ethanol     Status: None   Collection Time: 11/28/18 11:47 AM  Result Value Ref Range   Alcohol, Ethyl (B) <10 <10 mg/dL    Comment: (NOTE) Lowest detectable limit for serum alcohol is 10 mg/dL. For medical purposes only. Performed at [facility], [address], [place], [place] [address]   TSH     Status: None   Collection Time: 11/28/18 12:38 PM  Result Value Ref Range   TSH 2.950 0.350 - 4.500 uIU/mL  Comment: Performed by a 3rd Generation assay with a functional sensitivity of <=0.01 uIU/mL. Performed at [facility], [address] [place], [place] [address]   Urinalysis, Routine w reflex microscopic     Status: Abnormal   Collection Time: 11/28/18  2:15 PM  Result Value Ref Range   Color, Urine YELLOW YELLOW   APPearance CLEAR CLEAR   Specific Gravity, Urine 1.011 1.005 - 1.030   pH 6.0 5.0 - 8.0   Glucose, UA 50 (A) NEGATIVE mg/dL   Hgb urine dipstick NEGATIVE NEGATIVE   Bilirubin Urine NEGATIVE NEGATIVE   Ketones, ur NEGATIVE NEGATIVE mg/dL   Protein, ur 100 (A) NEGATIVE mg/dL   Nitrite NEGATIVE NEGATIVE   Leukocytes,Ua NEGATIVE NEGATIVE   RBC / HPF 0-5 0 - 5 RBC/hpf   WBC, UA 0-5 0 - 5 WBC/hpf   Bacteria, UA NONE SEEN NONE SEEN   Hyaline Casts, UA PRESENT     Comment: Performed at [facility], [address], [place], [place] [address]   Dg Chest 1 View  Result Date: 11/28/2018 CLINICAL DATA:  Unwitnessed fall. EXAM: CHEST  1  VIEW COMPARISON:  09/18/2018 FINDINGS: The patient is mildly rotated to the right. The cardiac silhouette appears mildly enlarged although it is accentuated by AP technique and low lung volumes. No airspace consolidation, edema, pleural effusion, pneumothorax is identified. No acute osseous abnormality is seen. IMPRESSION: Low lung volumes without evidence of acute airspace disease. Electronically Signed   By: Logan Bores M.D.   On: 11/28/2018 12:17   Ct Head Wo Contrast  Result Date: 11/28/2018 CLINICAL DATA:  68 year old female with history of unwitnessed fall. Bruising in the left arm, left hand and chin. Altered consciousness. EXAM: CT HEAD WITHOUT CONTRAST CT CERVICAL SPINE WITHOUT CONTRAST TECHNIQUE: Multidetector CT imaging of the head and cervical spine was performed following the standard protocol without intravenous contrast. Multiplanar CT image reconstructions of the cervical spine were also generated. COMPARISON:  Head CT a 09/15/2018.  MRI of the brain 09/15/2018. FINDINGS: CT HEAD FINDINGS Brain: Mild cerebral atrophy. Patchy areas of mild decreased attenuation are noted throughout the deep and periventricular white matter of the cerebral hemispheres bilaterally, compatible with mild chronic microvascular ischemic disease. No evidence of acute infarction, hemorrhage, hydrocephalus, extra-axial collection or mass lesion/mass effect. Vascular: No hyperdense vessel or unexpected calcification. Skull: Normal. Negative for fracture or focal lesion. Sinuses/Orbits: No acute finding. Multifocal mucosal thickening throughout the maxillary sinuses bilaterally, as well as a 1.8 cm low-attenuation lesion in the left maxillary sinus, compatible with a small mucosal retention cyst or polyp. Other: None. CT CERVICAL SPINE FINDINGS Alignment: Normal. Skull base and vertebrae: No acute fracture. No primary bone lesion or focal pathologic process. Soft tissues and spinal canal: No prevertebral fluid or swelling.  No visible canal hematoma. Disc levels: Mild multilevel degenerative disc disease, most apparent at C5-C6 and C6-C7. Mild multilevel facet arthropathy. Upper chest: Unremarkable. Other: None. IMPRESSION: 1. No no acute intracranial abnormality. 2. No findings to suggest significant acute traumatic injury to the cervical spine. 3. Mild cerebral atrophy with mild chronic microvascular ischemic changes in the cerebral white matter, similar to prior examinations. 4. Additional incidental findings, as above. Electronically Signed   By: Vinnie Langton M.D.   On: 11/28/2018 12:10   Ct Cervical Spine Wo Contrast  Result Date: 11/28/2018 CLINICAL DATA:  68 year old female with history of unwitnessed fall. Bruising in the left arm, left hand and chin. Altered consciousness. EXAM: CT HEAD WITHOUT CONTRAST CT CERVICAL  SPINE WITHOUT CONTRAST TECHNIQUE: Multidetector CT imaging of the head and cervical spine was performed following the standard protocol without intravenous contrast. Multiplanar CT image reconstructions of the cervical spine were also generated. COMPARISON:  Head CT a 09/15/2018.  MRI of the brain 09/15/2018. FINDINGS: CT HEAD FINDINGS Brain: Mild cerebral atrophy. Patchy areas of mild decreased attenuation are noted throughout the deep and periventricular white matter of the cerebral hemispheres bilaterally, compatible with mild chronic microvascular ischemic disease. No evidence of acute infarction, hemorrhage, hydrocephalus, extra-axial collection or mass lesion/mass effect. Vascular: No hyperdense vessel or unexpected calcification. Skull: Normal. Negative for fracture or focal lesion. Sinuses/Orbits: No acute finding. Multifocal mucosal thickening throughout the maxillary sinuses bilaterally, as well as a 1.8 cm low-attenuation lesion in the left maxillary sinus, compatible with a small mucosal retention cyst or polyp. Other: None. CT CERVICAL SPINE FINDINGS Alignment: Normal. Skull base and vertebrae:  No acute fracture. No primary bone lesion or focal pathologic process. Soft tissues and spinal canal: No prevertebral fluid or swelling. No visible canal hematoma. Disc levels: Mild multilevel degenerative disc disease, most apparent at C5-C6 and C6-C7. Mild multilevel facet arthropathy. Upper chest: Unremarkable. Other: None. IMPRESSION: 1. No no acute intracranial abnormality. 2. No findings to suggest significant acute traumatic injury to the cervical spine. 3. Mild cerebral atrophy with mild chronic microvascular ischemic changes in the cerebral white matter, similar to prior examinations. 4. Additional incidental findings, as above. Electronically Signed   By: Vinnie Langton M.D.   On: 11/28/2018 12:10   Dg Hand Complete Left  Result Date: 11/28/2018 CLINICAL DATA:  68 year old female with a fall EXAM: LEFT HAND - COMPLETE 3+ VIEW COMPARISON:  06/03/2018 FINDINGS: Osteopenia. No acute displaced fracture. No radiopaque foreign body. Degenerative changes of the interphalangeal joints. No subluxation/dislocation. No erosive changes. Degenerative changes of the first carpometacarpal joint. IMPRESSION: Osteopenia, with no acute bony abnormality identified. Osteoarthritis Electronically Signed   By: Corrie Mckusick D.O.   On: 11/28/2018 12:15    Pending Labs [employer] (From admission, onward)    Start     Ordered   Signed and Held  CBC  (enoxaparin (LOVENOX)    CrCl < 30 ml/min)  Once,   R    Comments:  Baseline for enoxaparin therapy IF NOT ALREADY DRAWN.  Notify MD if PLT < 100 K.    Signed and Held   Signed and Held  Creatinine, serum  (enoxaparin (LOVENOX)    CrCl < 30 ml/min)  Once,   R    Comments:  Baseline for enoxaparin therapy IF NOT ALREADY DRAWN.    Signed and Held   Signed and Held  Creatinine, serum  (enoxaparin (LOVENOX)    CrCl < 30 ml/min)  Weekly,   R    Comments:  while on enoxaparin therapy.    Signed and Held          Vitals/Pain Today's Vitals   11/28/18 1330  11/28/18 1400 11/28/18 1430 11/28/18 1500  BP: (!) 154/79 (!) 142/80 (!) 141/79 (!) 143/70  Pulse: 73 74 72 70  Resp: 20 10 (!) 23 20  Temp:      TempSrc:      SpO2: 100% 100% 96% (!) 89%    Isolation Precautions No active isolations  Medications Medications - No data to display  Mobility non-ambulatory High fall risk   Focused Assessments Neuro Assessment Handoff:           Neuro Assessment: Exceptions to WDL Neuro Checks:  Last Documented NIHSS Modified Score:  Unable to assess. Patient not able to follow commands.      R Recommendations: See Admitting Provider Note  Report given to: [place]  Additional Notes: Patient from [place].

## 2018-11-28 NOTE — Consult Note (Signed)
Kathleen A. Merlene Laughter, MD     [email or web address]          [name] is an 68 y.o. female.   ASSESSMENT/PLAN: 1.  Acute alteration of mentation likely due to combination of events including acute renal failure and the polypharmacy.  No additional work-up is warranted at this time given her history and previous extensive work-up.  I did go ahead and discontinue all psychotropic medications including the Lamictal, Haldol and the Risperdal.  We should continue to minimize the use of psychotropic medications as much as possible.  We can restart these if needed.  2.  Syncopal episode of unclear etiology possibly due to psychotropic medications above.  Also consider orthostatic hypotension.  When possible we should check for orthostatic hypotension.  3.  Multiple neurologic event in the past thought to be due to psychosomatic disorders.  4.  Previous episodes of toxic metabolic encephalopathy   5.  Blanching erythema of the legs query cellulitis   The patient is 68 year old white female who had an episode where she apparently had what appears to be a single episode.  She appears to be just sitting up when she seemingly lost consciousness and hit her head on the table.  The patient has been admitted to the hospital multiple times for various neurological events including catatonia, focal weakness and encephalopathy.  She has had numerous imaging and work-up which have all been unrevealing.  She had multiple EEGs which have also been essentially negative for epileptiform discharges.  The most significant finding was rhythmic delta activity seen on one scan.  The patient is quite stuporous during the evaluation and therefore history is not obtainable.   GENERAL: The patient lays in bed with eyes closed.  HEENT: There is evidence of hematoma and recent trauma involving the chin more on the right side.  Neck is supple.  ABDOMEN: soft  EXTREMITIES: There is 2+ pitting edema  of the feet and legs.  There is significant blanching erythema of the legs bilaterally.  BACK: Normal  SKIN: Normal by inspection.    MENTAL STATUS: The patient is quite stuporous.  Multiple sternal rub resulted in only brief partial eye opening.  Finally with nailbed painful stimuli resulted in the patient opening her eyes for more sustained the time.  Additionally, she did follow midline and appendicular commands but had no verbal output.  CRANIAL NERVES: Pupils are equal, round and reactive to light and accomodation; extra ocular movements are full, there is no significant nystagmus; visual fields are full -by direct threat; upper and lower facial muscles are normal in strength and symmetric, there is no flattening of the nasolabial folds; tongue is midline.  MOTOR: There is reduced tone of the extremities throughout.  She has antigravity strength of the upper extremities.  There is only flicker of movement in the legs.  COORDINATION: There is occasional tremor of the upper extremities.  The quality is that of a high frequency low amplitude.  However, no clear rigidity or bradykinesia is observed.  No mild clonus is noted.  No clear dysmetria.[name] Kitchen  REFLEXES: Deep tendon reflexes are symmetrical and normal.  SENSATION: She responds to painful stimuli bilaterally.     ADMSSION NOTE HPI: [name] is a 68 y.o. female with a complex psychiatric history and has had multiple recent hospitalizations, subspecialty consults and visits in the healthcare system for recurrent near syncopal episodes and catatonic spells where a definite cause has never been fully found or described. She is followed  by [name] as her neurologist.  She recently saw him last month.  She is apparently being treated for a history of epilepsy since age 74 but [name] is not convinced that she has epilepsy.  She had been started on Keppra couple months ago and [name] has recently gotten her off Keppra and  started her on Lamictal for its mood stabilization properties.  She has had 2 recent normal EEG studies.  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  She has had multiple MRIs imaging of the brain that have been mostly unremarkable with the exception of minimal white matter changes.  She has had multiple hospitalizations.  She was diagnosed with a stroke in December 2019 but there was no MRI evidence of acute CVA.  She has had full stroke work-up recently.  The patient resides at [facility] and had an unwitnessed fall at 4 AM.  She was noted to have some bruising to the left arm and left hand and chin.  She apparently was speaking with her counselors at around 10 AM this morning and developed an acute episode where she stopped talking and "slumped over" and became confused and came close to losing consciousness.  This is similar to multiple episodes that she has had in the past where she has become catatonic and this lasted for several hours and then she returns to her baseline.  These episodes have been present since she was a child.  She has been evaluated by psychiatry.  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  The patient is unable to give history but apparently has been taking a course of Levaquin for 10 days according to nursing home records.  It is not sure if she is being treated for UTI or pneumonia as her urine and chest x-ray appear normal.   PRIOR NEURO NOTE  1.  Acute encephalopathy: This is most likely multifactorial including the effects of acute urinary tract infection -Enterococcus faecalis.  There is undoubtedly however significant psychopathology given the patient's longstanding history.  I will check with the patient's for an RPR and Troponin 1.  Otherwise no additional recommendations  is suggested at this time.    Patient is 68 year old white female who presents with episodes of hallucination and confusion.  The patient has been admitted multiple times for various neurological problems including flaccid extremities, altered mental status and left-sided weakness.  She has been worked up numerous times with extensive imaging and the EEG.  They all have been unremarkable.  The ongoing consensus with the patient's neurological symptoms that they are due to psychosomatic disorders including catatonia.  Patient has responded well previously.  She again presents with episode of confusion and hallucination.  Work-up has been significant for several metabolic derangements but most of them have been chronic including anemia, hyperglycemia and chronic renal impairment.  She was noted to have significant urinalysis however and the culture grew out Enterococcus faecalis.  The patient is amnestic as to why she is in the hospital.  She reports that she continues to have hallucinations which are worrisome to the patient.  They occur both when she is awake but also when she closes her eyes to go to sleep.  She complains of having significant discomfort of the thoracic region and legs especially since she started having these events.  She tells me however that she has had a long history of leg pain.  She has a habitus of someone who has significant sleep apnea as reported in the past medical history.  She tells me she is compliant with CPAP use.  The review of systems otherwise negative. Blood pressure (!) 154/88, pulse 76, temperature 98.7 F (37.1 C), temperature source Oral, resp. rate (!) 21, height 5\' 3"  (1.6 m), weight 96.1 kg, SpO2 97 %.  Past Medical History:  Diagnosis Date   Anxiety    Asthma    Back pain    Barrett esophagus    CHF (congestive heart failure) (HCC)    CKD (chronic kidney disease) stage 3, GFR 30-59 ml/min (HCC)    COPD (chronic obstructive pulmonary disease)  (HCC)    Depression    Diastolic heart failure (HCC)    Essential hypertension    Fibromyalgia    GERD (gastroesophageal reflux disease)    Gout    History of pericarditis    Hyperlipidemia    Hypothyroid    Major depressive disorder    Memory loss    Nephrolithiasis    2008   Obesity hypoventilation syndrome (HCC)    CPAP   Obstructive sleep apnea    PE (pulmonary embolism)    2010   Peripheral neuropathy    PTSD (post-traumatic stress disorder)    Rheumatoid arthritis (HCC)    Schizophrenia (HCC)    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 Her smoking use included cigarettes. She has never used smokeless tobacco. She reports that she does not drink alcohol or use drugs.  Allergies:  Allergies  Allergen Reactions   Benztropine Mesylate Other (See Comments)    Alopecia and white spots on eyes   Codeine Swelling, Palpitations and Other (See Comments)    Mouth Swelling and Tachycardia    Diazepam Other (See Comments)    COMA   Diphenhydramine Hcl Anxiety, Palpitations and Other (See Comments)    Tachycardia and anxiousness   Penicillins Swelling and Other (See Comments)    REACTION: ulcers in mouth, swelling, throat swelling  Has patient had a PCN reaction causing immediate rash, facial/tongue/throat swelling, SOB or lightheadedness with  hypotension: YES Has patient had a PCN reaction causing severe rash involving Mucus membranes or skin necrosis: Unknown Has patient had a PCN reaction that required hospitalization Unknown Has patient had a PCN reaction occurring within the last 10 years: unknown If all of the above answers are "NO", then may proceed with Cephalospori   Sulfonamide Derivatives Rash    Blisters, also   Thioridazine Hcl Swelling   Lisinopril Cough    Medications: Prior to Admission medications   Medication Sig Start Date End Date Taking? Authorizing  Provider  acetaminophen (TYLENOL) 325 MG tablet Take 325 mg by mouth 2 (two) times daily.    Yes [provider]  albuterol (PROVENTIL) (2.5 MG/3ML) 0.083% nebulizer solution Take 2.5 mg by nebulization every 6 (six) hours as needed for wheezing.    Yes [provider]  albuterol (VENTOLIN HFA) 108 (90 Base) MCG/ACT inhaler Inhale 1-2 puffs into the lungs every 6 (six) hours as needed for wheezing or shortness of breath.   Yes [provider]  amLODipine (NORVASC) 5 MG tablet Take 1 tablet (5 mg total) by mouth daily. Patient taking differently: Take 5 mg by mouth every morning.  07/31/18 07/31/19 Yes Ghimire, Henreitta Leber, MD  aspirin 81 MG tablet Take 81 mg by mouth every morning.    Yes [provider]  atorvastatin (LIPITOR) 40 MG tablet Take 40 mg by mouth at bedtime.   Yes [provider]  carvedilol (COREG) 6.25 MG tablet Take 1 tablet (6.25 mg total) by mouth 2 (two) times daily with a meal. 08/18/18  Yes Lavina Hamman, MD  Cholecalciferol (VITAMIN D3) 50 MCG (2000 UT) TABS Take 4,000 Units by mouth every morning.    Yes [provider]  dextromethorphan (DELSYM) 30 MG/5ML liquid Take 30 mg by mouth daily as needed for cough.   Yes [provider]  docusate sodium (COLACE) 100 MG capsule Take 100 mg by mouth every morning.    Yes [provider]  Dulaglutide (TRULICITY) 1.5 MW/4.1LK  SOPN Inject 1.5 mg into the skin every Friday.    Yes [provider]  EPINEPHrine (EPI-PEN) 0.3 mg/0.3 mL DEVI Inject 0.3 mg into the muscle once as needed (for bee stings, then go to the ED immediately after using).    Yes [provider]  gabapentin (NEURONTIN) 300 MG capsule Take 300-600 mg by mouth See admin instructions. Take 300mg  by mouth twice daily and take 600mg  at bedtime 08/28/18  Yes [provider]  glipiZIDE (GLUCOTROL XL) 5 MG 24 hr tablet Take 5 mg by mouth 2 (two) times daily.    Yes [provider]  Glycerin, Laxative, (FLEET LIQUID GLYCERIN SUPP RE) Place 1 suppository rectally daily as needed (for constipation).   Yes [provider]  haloperidol (HALDOL) 0.5 MG tablet Take 1 tablet (0.5 mg total) by mouth 2 (two) times daily. 07/31/18  Yes Ghimire, Henreitta Leber, MD  hydrocortisone 2.5 % ointment Apply 1 application topically daily as needed (itchy skin).   Yes [provider]  insulin degludec (TRESIBA FLEXTOUCH) 100 UNIT/ML SOPN FlexTouch Pen Inject 20 Units into the skin at bedtime.   Yes [provider]  lamoTRIgine (LAMICTAL) 25 MG tablet One tablet twice a day for 2 weeks, then take 2 tablets twice a day for 2 weeks, then take 3 tablets twice a day Patient taking differently: Take 75 mg by mouth 2 (two) times daily.  10/19/18  Yes Kathrynn Ducking, MD  levofloxacin (LEVAQUIN) 500 MG tablet Take 500 mg by mouth every evening. 10 day course to be completed on 11/28/2018   Yes [provider]  levothyroxine (SYNTHROID, LEVOTHROID) 25 MCG tablet Take 1 tablet (25 mcg total) by mouth daily at 6 (six) AM. 08/19/18  Yes Lavina Hamman, MD  linagliptin (TRADJENTA) 5 MG TABS tablet Take 5 mg by mouth every morning.    Yes [provider]  loperamide (IMODIUM) 2 MG capsule Take 2 mg by mouth 3 (three) times daily as needed (for diarrhea).  Yes [provider]  montelukast (SINGULAIR) 10 MG tablet Take  10 mg by mouth at bedtime.   Yes [provider]  Multiple Vitamin (DAILY VITE PO) Take 1 tablet by mouth every morning.    Yes [provider]  omeprazole (PRILOSEC) 20 MG capsule Take 20 mg by mouth every morning.    Yes [provider]  phenazopyridine (PYRIDIUM) 100 MG tablet Take 200 mg by mouth 3 (three) times daily as needed for pain.   Yes [provider]  polyethylene glycol powder (GLYCOLAX/MIRALAX) powder Take 17 g by mouth daily as needed (for constipation).   Yes [provider]  predniSONE (DELTASONE) 10 MG tablet Take 10 mg by mouth 2 (two) times daily. 10 day course to be completed on 11/29/2018   Yes [provider]  primidone (MYSOLINE) 50 MG tablet Take 50 mg by mouth every morning.   Yes [provider]  pyridOXINE (VITAMIN B-6) 100 MG tablet Take 200 mg by mouth 2 (two) times daily.   Yes [provider]  spironolactone (ALDACTONE) 25 MG tablet Take 1 tablet (25 mg total) by mouth daily. Patient taking differently: Take 25 mg by mouth every morning.  04/12/18  Yes Mesner, Corene Cornea, MD  torsemide (DEMADEX) 20 MG tablet Take 60 mg alternating with 40 mg daily. Patient taking differently: Take 40-60 mg by mouth See admin instructions. 60 mg by mouth every other day: alternating with 40 mg by mouth every other day 07/26/18  Yes Strader, Fransisco Hertz, PA-C  Vitamins A & D (VITAMIN A & D) ointment Apply 1 application topically See admin instructions. To be applied after baths in the morning due to rash under skin folds   Yes [provider]  risperiDONE (RISPERDAL) 0.5 MG tablet Take 1 tablet (0.5 mg total) by mouth 2 (two) times daily for 30 days. Please give 0.5mg  in am and 1mg  at bedtime. Patient taking differently: Take 0.5-1 mg by mouth See admin instructions. Give 0.5mg  in am and 1mg  at bedtime. 09/19/18 10/19/18  Manuella Ghazi, Pratik D, DO    Scheduled Meds:  [START ON 11/29/2018] amLODipine  5 mg Oral q morning -  10a   [START ON 11/29/2018] aspirin EC  81 mg Oral q morning - 10a   atorvastatin  40 mg Oral QHS   carvedilol  6.25 mg Oral BID WC   [START ON 11/29/2018] docusate sodium  100 mg Oral q morning - 10a   enoxaparin (LOVENOX) injection  30 mg Subcutaneous Q24H   haloperidol  0.5 mg Oral BID   insulin aspart  0-9 Units Subcutaneous TID WC   insulin glargine  10 Units Subcutaneous QHS   lamoTRIgine  75 mg Oral BID   [START ON 11/29/2018] levothyroxine  25 mcg Oral Q0600   [START ON 11/29/2018] pantoprazole  40 mg Oral Daily   risperiDONE  0.25 mg Oral BID   sodium chloride flush  3 mL Intravenous Q12H   Continuous Infusions:  sodium chloride     PRN Meds:.sodium chloride, acetaminophen **OR** acetaminophen, albuterol, dextromethorphan, hydrALAZINE, ondansetron **OR** ondansetron (ZOFRAN) IV, polyethylene glycol, sodium chloride flush     Results for orders placed or performed during the hospital encounter of 11/28/18 (from the past 48 hour(s))  CBG monitoring, ED     Status: Abnormal   Collection Time: 11/28/18 11:36 AM  Result Value Ref Range   Glucose-Capillary 258 (H) 70 - 99 mg/dL  CBC with Differential/Platelet     Status: Abnormal   Collection  Time: 11/28/18 11:47 AM  Result Value Ref Range   WBC 9.9 4.0 - 10.5 K/uL   RBC 2.93 (L) 3.87 - 5.11 MIL/uL   Hemoglobin 9.0 (L) 12.0 - 15.0 g/dL   HCT 28.4 (L) 36.0 - 46.0 %   MCV 96.9 80.0 - 100.0 fL   MCH 30.7 26.0 - 34.0 pg   MCHC 31.7 30.0 - 36.0 g/dL   RDW 13.6 11.5 - 15.5 %   Platelets 219 150 - 400 K/uL   nRBC 0.0 0.0 - 0.2 %   Neutrophils Relative % 88 %   Neutro Abs 8.8 (H) 1.7 - 7.7 K/uL   Lymphocytes Relative 6 %   Lymphs Abs 0.6 (L) 0.7 - 4.0 K/uL   Monocytes Relative 4 %   Monocytes Absolute 0.4 0.1 - 1.0 K/uL   Eosinophils Relative 1 %   Eosinophils Absolute 0.1 0.0 - 0.5 K/uL   Basophils Relative 0 %   Basophils Absolute 0.0 0.0 - 0.1 K/uL   Immature Granulocytes 1 %   Abs Immature Granulocytes  0.08 (H) 0.00 - 0.07 K/uL    Comment: Performed at [facility], [address], [place], [place] [address]  Comprehensive metabolic panel     Status: Abnormal   Collection Time: 11/28/18 11:47 AM  Result Value Ref Range   Sodium 139 135 - 145 mmol/L   Potassium 4.1 3.5 - 5.1 mmol/L   Chloride 105 98 - 111 mmol/L   CO2 24 22 - 32 mmol/L   Glucose, Bld 271 (H) 70 - 99 mg/dL   BUN 41 (H) 8 - 23 mg/dL   Creatinine, Ser 2.49 (H) 0.44 - 1.00 mg/dL   Calcium 8.5 (L) 8.9 - 10.3 mg/dL   Total Protein 6.8 6.5 - 8.1 g/dL   Albumin 3.4 (L) 3.5 - 5.0 g/dL   AST 21 15 - 41 U/L   ALT 23 0 - 44 U/L   Alkaline Phosphatase 89 38 - 126 U/L   Total Bilirubin 0.6 0.3 - 1.2 mg/dL   GFR calc non Af Amer 19 (L) >60 mL/min   GFR calc Af Amer 22 (L) >60 mL/min   Anion gap 10 5 - 15    Comment: Performed at [facility], [address], [place], [place] [address]  Rapid urine drug screen (hospital performed)     Status: None   Collection Time: 11/28/18 11:47 AM  Result Value Ref Range   Opiates NONE DETECTED NONE DETECTED   Cocaine NONE DETECTED NONE DETECTED   Benzodiazepines NONE DETECTED NONE DETECTED   Amphetamines NONE DETECTED NONE DETECTED   Tetrahydrocannabinol NONE DETECTED NONE DETECTED   Barbiturates NONE DETECTED NONE DETECTED    Comment: (NOTE) DRUG SCREEN FOR MEDICAL PURPOSES ONLY.  IF CONFIRMATION IS NEEDED FOR ANY PURPOSE, NOTIFY LAB WITHIN 5 DAYS. LOWEST DETECTABLE LIMITS FOR URINE DRUG SCREEN Drug Class                     Cutoff (ng/mL) Amphetamine and metabolites    1000 Barbiturate and metabolites    200 Benzodiazepine                 017 Tricyclics and metabolites     300 Opiates and metabolites        300 Cocaine and metabolites        300 THC  68 Performed at [facility], [address] [place], [place] [address]   Ethanol     Status: None   Collection Time: 11/28/18 11:47 AM  Result Value Ref Range   Alcohol, Ethyl (B) <10 <10 mg/dL     Comment: (NOTE) Lowest detectable limit for serum alcohol is 10 mg/dL. For medical purposes only. Performed at [facility], [address], [place], [place] [address]   TSH     Status: None   Collection Time: 11/28/18 12:38 PM  Result Value Ref Range   TSH 2.950 0.350 - 4.500 uIU/mL    Comment: Performed by a 3rd Generation assay with a functional sensitivity of <=0.01 uIU/mL. Performed at [facility], [address] [place], [place] [address]   Urinalysis, Routine w reflex microscopic     Status: Abnormal   Collection Time: 11/28/18  2:15 PM  Result Value Ref Range   Color, Urine YELLOW YELLOW   APPearance CLEAR CLEAR   Specific Gravity, Urine 1.011 1.005 - 1.030   pH 6.0 5.0 - 8.0   Glucose, UA 50 (A) NEGATIVE mg/dL   Hgb urine dipstick NEGATIVE NEGATIVE   Bilirubin Urine NEGATIVE NEGATIVE   Ketones, ur NEGATIVE NEGATIVE mg/dL   Protein, ur 100 (A) NEGATIVE mg/dL   Nitrite NEGATIVE NEGATIVE   Leukocytes,Ua NEGATIVE NEGATIVE   RBC / HPF 0-5 0 - 5 RBC/hpf   WBC, UA 0-5 0 - 5 WBC/hpf   Bacteria, UA NONE SEEN NONE SEEN   Hyaline Casts, UA PRESENT     Comment: Performed at [facility], [address], [place], [place] [address]  Glucose, capillary     Status: Abnormal   Collection Time: 11/28/18  4:42 PM  Result Value Ref Range   Glucose-Capillary 187 (H) 70 - 99 mg/dL   Comment 1 Notify RN    Comment 2 Document in Chart        Studies/Results: HEAD C SPINE HT EXAM: CT HEAD WITHOUT CONTRAST  CT CERVICAL SPINE WITHOUT CONTRAST  TECHNIQUE: Multidetector CT imaging of the head and cervical spine was performed following the standard protocol without intravenous contrast. Multiplanar CT image reconstructions of the cervical spine were also generated.  COMPARISON:  Head CT a 09/15/2018.  MRI of the brain 09/15/2018.  FINDINGS: CT HEAD FINDINGS  Brain: Mild cerebral atrophy. Patchy areas of mild decreased attenuation are noted throughout the deep and  periventricular white matter of the cerebral hemispheres bilaterally, compatible with mild chronic microvascular ischemic disease. No evidence of acute infarction, hemorrhage, hydrocephalus, extra-axial collection or mass lesion/mass effect.  Vascular: No hyperdense vessel or unexpected calcification.  Skull: Normal. Negative for fracture or focal lesion.  Sinuses/Orbits: No acute finding. Multifocal mucosal thickening throughout the maxillary sinuses bilaterally, as well as a 1.8 cm low-attenuation lesion in the left maxillary sinus, compatible with a small mucosal retention cyst or polyp.  Other: None.  CT CERVICAL SPINE FINDINGS  Alignment: Normal.  Skull base and vertebrae: No acute fracture. No primary bone lesion or focal pathologic process.  Soft tissues and spinal canal: No prevertebral fluid or swelling. No visible canal hematoma.  Disc levels: Mild multilevel degenerative disc disease, most apparent at C5-C6 and C6-C7. Mild multilevel facet arthropathy.  Upper chest: Unremarkable.  Other: None.  IMPRESSION: 1. No no acute intracranial abnormality. 2. No findings to suggest significant acute traumatic injury to the cervical spine. 3. Mild cerebral atrophy with mild chronic microvascular ischemic changes in the cerebral white matter, similar to prior examinations. 4. Additional  incidental findings, as above.   Head CT scan is reviewed in person is shows no acute findings.  There is mild atrophy and mild leukoencephalopathy  indicating chronic microvascular changes.  Madisan Bice A. Merlene Cross, M.D.  Diplomate, Tax adviser of Psychiatry and Neurology ( Neurology). 11/28/2018, 5:57 PM

## 2018-11-29 DIAGNOSIS — R410 Disorientation, unspecified: Secondary | ICD-10-CM | POA: Diagnosis not present

## 2018-11-29 DIAGNOSIS — R4182 Altered mental status, unspecified: Secondary | ICD-10-CM

## 2018-11-29 DIAGNOSIS — R55 Syncope and collapse: Secondary | ICD-10-CM | POA: Diagnosis not present

## 2018-11-29 DIAGNOSIS — G9340 Encephalopathy, unspecified: Secondary | ICD-10-CM | POA: Diagnosis not present

## 2018-11-29 DIAGNOSIS — N184 Chronic kidney disease, stage 4 (severe): Secondary | ICD-10-CM | POA: Diagnosis not present

## 2018-11-29 DIAGNOSIS — K210 Gastro-esophageal reflux disease with esophagitis: Secondary | ICD-10-CM

## 2018-11-29 DIAGNOSIS — G4730 Sleep apnea, unspecified: Secondary | ICD-10-CM | POA: Diagnosis not present

## 2018-11-29 DIAGNOSIS — Z7401 Bed confinement status: Secondary | ICD-10-CM | POA: Diagnosis not present

## 2018-11-29 DIAGNOSIS — I10 Essential (primary) hypertension: Secondary | ICD-10-CM | POA: Diagnosis not present

## 2018-11-29 DIAGNOSIS — E1142 Type 2 diabetes mellitus with diabetic polyneuropathy: Secondary | ICD-10-CM | POA: Diagnosis not present

## 2018-11-29 DIAGNOSIS — I5032 Chronic diastolic (congestive) heart failure: Secondary | ICD-10-CM | POA: Diagnosis not present

## 2018-11-29 DIAGNOSIS — E039 Hypothyroidism, unspecified: Secondary | ICD-10-CM | POA: Diagnosis not present

## 2018-11-29 LAB — GLUCOSE, CAPILLARY
Glucose-Capillary: 67 mg/dL — ABNORMAL LOW (ref 70–99)
Glucose-Capillary: 141 mg/dL — ABNORMAL HIGH (ref 70–99)

## 2018-11-29 LAB — MRSA PCR SCREENING: MRSA by PCR: NEGATIVE

## 2018-11-29 MED ORDER — GLIPIZIDE ER 10 MG PO TB24: 10.0000 mg | ORAL_TABLET | Freq: Every day | ORAL | Status: DC

## 2018-11-29 MED ORDER — GABAPENTIN 300 MG PO CAPS: 300.0000 mg | ORAL_CAPSULE | Freq: Two times a day (BID) | ORAL | Status: DC

## 2018-11-29 MED ORDER — DEXTROSE 50 % IV SOLN
INTRAVENOUS | Status: AC
  Administered 2018-11-29: 08:00:00 25 mL
  Filled 2018-11-29: qty 50

## 2018-11-29 MED ORDER — GLYCERIN (LAXATIVE) 5.4 GM/DOSE RE ENEM: 1.0000 | ENEMA | Freq: Every day | RECTAL | Status: DC | PRN

## 2018-11-29 MED ORDER — DOCUSATE SODIUM 100 MG PO CAPS: 100.0000 mg | ORAL_CAPSULE | Freq: Two times a day (BID) | ORAL | Status: DC

## 2018-11-29 NOTE — Care Management Obs Status (Signed)
[facility] NOTIFICATION   Patient Details  Name: [name] MRN: [id number] Date of Birth: May 25, 1951   Medicare Observation Status Notification Given:  Yes    Tommy Medal 11/29/2018, 3:24 PM

## 2018-11-29 NOTE — NC FL2 (Addendum)
[place] LEVEL OF CARE SCREENING TOOL     IDENTIFICATION  Patient Name: [name] Birthdate: October 16, 1950 Sex: female Admission Date (Current Location): 11/28/2018  [place] and [place] Number:  [facility] [id number] [place] and Address:  [facility] [address] [place]      Provider Number: [id number]  Attending Physician Name and Address:  Barton Dubois, MD  Relative Name and Phone Number:  [name] [phone]    Current Level of Care: Other (Comment)(OBS) Recommended Level of Care: Family Care Home Prior Approval Number:    Date Approved/Denied:   PASRR Number:    Discharge Plan: Domiciliary (Rest home)    Current Diagnoses: Patient Active Problem List   Diagnosis Date Noted  . Altered mental state 11/28/2018  . Near syncope 11/28/2018  . Fall at home 11/28/2018  . History of seizure 11/28/2018  . 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 Pressure injury of skin 09/17/2018  . Respiratory failure ([place]) 08/16/2018  . Seizure ([place]) 08/16/2018  . Acute encephalopathy 07/27/2018  . Endotracheally intubated 07/27/2018  . Hypothyroidism 07/27/2018  . Pleuritic chest pain 07/14/2018  . Moderate persistent asthma without complication   . Type 2 diabetes mellitus ([place]) 04/19/2018  . Acute kidney injury superimposed on chronic kidney disease ([place]) 01/13/2018  . Altered mental status   . Encephalopathy 10/11/2016  . Chest pain 08/12/2016  . CKD (chronic kidney disease), stage IV ([place]) 08/12/2016  . History of pulmonary embolus (PE) 08/12/2016  . Obesity, Class III, BMI 40-49.9 (morbid obesity) ([place]) 08/12/2016  . RBBB 08/12/2016  . Positive D dimer 08/12/2016  . Cerebral infarction ([place]) 11/10/2015  . Physical deconditioning 07/01/2011  . Weakness 07/01/2011  . Diabetic foot ulcer ([place]) 05/19/2011  . Polypharmacy 02/09/2011  . BACK PAIN WITH RADICULOPATHY 05/25/2010  . Type 2 diabetes mellitus with  diabetic polyneuropathy, without long-term current use of insulin ([place]) 08/18/2009  . Chronic diastolic heart failure ([place]) 04/25/2009  . Normocytic anemia 04/14/2009  . Sleep apnea 12/11/2008  . Diabetic peripheral neuropathy ([place]) 05/04/2007  . HYPERCHOLESTEROLEMIA 10/13/2006  . Gout, unspecified 10/13/2006  . HYPOKALEMIA 10/13/2006  . Schizophrenia ([place]) 10/13/2006  . Depression 10/13/2006  . Essential hypertension 10/13/2006  . Venous (peripheral) insufficiency 10/13/2006  . RHINITIS, ALLERGIC 10/13/2006  . Asthma 10/13/2006  . Reflux esophagitis 10/13/2006  . OSTEOARTHRITIS, MULTI SITES 10/13/2006  . INCONTINENCE, URGE 10/13/2006    Orientation RESPIRATION BLADDER Height & Weight     Self, Time, Situation, Place  Normal Continent Weight: 96.1 kg Height:  5\' 3"  (160 cm)  BEHAVIORAL SYMPTOMS/MOOD NEUROLOGICAL BOWEL NUTRITION STATUS      Continent Diet(heart healthy)  AMBULATORY STATUS COMMUNICATION OF NEEDS Skin   Limited Assist Verbally Skin abrasions, Bruising                       Personal Care Assistance Level of Assistance  Bathing, Feeding, Dressing Bathing Assistance: Limited assistance Feeding assistance: Limited assistance Dressing Assistance: Limited assistance     Functional Limitations Info  Sight, Hearing, Speech Sight Info: Adequate Hearing Info: Adequate Speech Info: Adequate    SPECIAL CARE FACTORS FREQUENCY  PT (By licensed PT)     PT Frequency: 3x/week              Contractures Contractures Info: Not present    Additional Factors Info  Code Status, Allergies, Psychotropic, Insulin Sliding Scale Code Status Info: FULL CODE Allergies Info: Benztropine Mesylate; DIPHENDYDRALAMINE; CODEINE; DIAZEPAM; PENICILLIN; SULFANOMIDE;  THIORIDAZINE, LISINOPRIL Psychotropic Info: HALDOL; RISPERDAL Insulin Sliding Scale Info: SEE DC SUMMARY       Current Medications (11/29/2018):  This is the current hospital active medication list Current  Facility-Administered Medications  Medication Dose Route Frequency Provider Last Rate Last Dose  . 0.9 %  sodium chloride infusion  250 mL Intravenous PRN Johnson, Clanford L, MD      . acetaminophen (TYLENOL) tablet 650 mg  650 mg Oral Q6H PRN Johnson, Clanford L, MD       Or  . acetaminophen (TYLENOL) suppository 650 mg  650 mg Rectal Q6H PRN Johnson, Clanford L, MD      . albuterol (PROVENTIL HFA;VENTOLIN HFA) 108 (90 Base) MCG/ACT inhaler 1-2 puff  1-2 puff Inhalation Q6H PRN Johnson, Clanford L, MD      . amLODipine (NORVASC) tablet 5 mg  5 mg Oral q morning - 10a Johnson, Clanford L, MD   5 mg at 11/29/18 0803  . aspirin EC tablet 81 mg  81 mg Oral q morning - 10a Johnson, Clanford L, MD   81 mg at 11/29/18 0803  . atorvastatin (LIPITOR) tablet 40 mg  40 mg Oral QHS Johnson, Clanford L, MD      . carvedilol (COREG) tablet 6.25 mg  6.25 mg Oral BID WC Johnson, Clanford L, MD   6.25 mg at 11/29/18 0803  . dextromethorphan (DELSYM) 30 MG/5ML liquid 30 mg  30 mg Oral Daily PRN Johnson, Clanford L, MD      . docusate sodium (COLACE) capsule 100 mg  100 mg Oral q morning - 10a Johnson, Clanford L, MD   100 mg at 11/29/18 0804  . enoxaparin (LOVENOX) injection 30 mg  30 mg Subcutaneous Q24H Johnson, Clanford L, MD      . hydrALAZINE (APRESOLINE) injection 10 mg  10 mg Intravenous Q4H PRN Johnson, Clanford L, MD      . insulin aspart (novoLOG) injection 0-9 Units  0-9 Units Subcutaneous TID WC Johnson, Clanford L, MD   1 Units at 11/29/18 1131  . insulin glargine (LANTUS) injection 10 Units  10 Units Subcutaneous QHS Wynetta Emery, Clanford L, MD   10 Units at 11/28/18 2229  . levothyroxine (SYNTHROID, LEVOTHROID) tablet 25 mcg  25 mcg Oral Q0600 Johnson, Clanford L, MD      . ondansetron (ZOFRAN) tablet 4 mg  4 mg Oral Q6H PRN Johnson, Clanford L, MD       Or  . ondansetron (ZOFRAN) injection 4 mg  4 mg Intravenous Q6H PRN Johnson, Clanford L, MD      . pantoprazole (PROTONIX) EC tablet 40 mg  40 mg  Oral Daily Johnson, Clanford L, MD   40 mg at 11/29/18 0804  . polyethylene glycol (MIRALAX / GLYCOLAX) packet 17 g  17 g Oral Daily PRN Johnson, Clanford L, MD   17 g at 11/29/18 1131  . sodium chloride flush (NS) 0.9 % injection 3 mL  3 mL Intravenous Q12H Johnson, Clanford L, MD   3 mL at 11/29/18 0805  . sodium chloride flush (NS) 0.9 % injection 3 mL  3 mL Intravenous PRN Johnson, Clanford L, MD         Discharge Medications:  Medication List    STOP taking these medications   haloperidol 0.5 MG tablet Commonly known as:  HALDOL   lamoTRIgine 25 MG tablet Commonly known as:  LAMICTAL   levofloxacin 500 MG tablet Commonly known as:  LEVAQUIN   loperamide 2 MG capsule Commonly known  as:  IMODIUM   phenazopyridine 100 MG tablet Commonly known as:  PYRIDIUM   predniSONE 10 MG tablet Commonly known as:  DELTASONE   risperiDONE 0.5 MG tablet Commonly known as:  RISPERDAL     TAKE these medications   acetaminophen 325 MG tablet Commonly known as:  TYLENOL Take 325 mg by mouth 2 (two) times daily.   albuterol (2.5 MG/3ML) 0.083% nebulizer solution Commonly known as:  PROVENTIL Take 2.5 mg by nebulization every 6 (six) hours as needed for wheezing.   Ventolin HFA 108 (90 Base) MCG/ACT inhaler Generic drug:  albuterol Inhale 1-2 puffs into the lungs every 6 (six) hours as needed for wheezing or shortness of breath.   amLODipine 5 MG tablet Commonly known as:  NORVASC Take 1 tablet (5 mg total) by mouth daily. What changed:  when to take this   aspirin 81 MG tablet Take 81 mg by mouth every morning.   atorvastatin 40 MG tablet Commonly known as:  LIPITOR Take 40 mg by mouth at bedtime.   carvedilol 6.25 MG tablet Commonly known as:  COREG Take 1 tablet (6.25 mg total) by mouth 2 (two) times daily with a meal.   DAILY VITE PO Take 1 tablet by mouth every morning.   dextromethorphan 30 MG/5ML liquid Commonly known as:  DELSYM Take 30 mg by  mouth daily as needed for cough.   docusate sodium 100 MG capsule Commonly known as:  COLACE Take 1 capsule (100 mg total) by mouth 2 (two) times daily. Hold for diarrhea What changed:    when to take this  additional instructions   EPINEPHrine 0.3 mg/0.3 mL Devi Commonly known as:  EPI-PEN Inject 0.3 mg into the muscle once as needed (for bee stings, then go to the ED immediately after using).   gabapentin 300 MG capsule Commonly known as:  NEURONTIN Take 1 capsule (300 mg total) by mouth 2 (two) times daily. What changed:    how much to take  when to take this  additional instructions   glipiZIDE 10 MG 24 hr tablet Commonly known as:  GLUCOTROL XL Take 1 tablet (10 mg total) by mouth daily with breakfast. What changed:    medication strength  how much to take  when to take this   Glycerin (Laxative) 5.4 GM/DOSE Enem Commonly known as:  Fleet Liquid Glycerin Supp Place 1 enema rectally daily as needed (for severe constipation). What changed:    medication strength  how much to take  reasons to take this   hydrocortisone 2.5 % ointment Apply 1 application topically daily as needed (itchy skin).   levothyroxine 25 MCG tablet Commonly known as:  SYNTHROID, LEVOTHROID Take 1 tablet (25 mcg total) by mouth daily at 6 (six) AM.   linagliptin 5 MG Tabs tablet Commonly known as:  TRADJENTA Take 5 mg by mouth every morning.   montelukast 10 MG tablet Commonly known as:  SINGULAIR Take 10 mg by mouth at bedtime.   omeprazole 20 MG capsule Commonly known as:  PRILOSEC Take 20 mg by mouth every morning.   polyethylene glycol powder 17 GM/SCOOP powder Commonly known as:  GLYCOLAX/MIRALAX Take 17 g by mouth daily as needed (for constipation).   primidone 50 MG tablet Commonly known as:  MYSOLINE Take 50 mg by mouth every morning.   pyridOXINE 100 MG tablet Commonly known as:  VITAMIN B-6 Take 200 mg by mouth 2 (two) times daily.    spironolactone 25 MG tablet Commonly known as:  Aldactone Take 1 tablet (25 mg total) by mouth daily. What changed:  when to take this   torsemide 20 MG tablet Commonly known as:  DEMADEX Take 60 mg alternating with 40 mg daily. What changed:    how much to take  how to take this  when to take this  additional instructions   Tresiba FlexTouch 100 UNIT/ML Sopn FlexTouch Pen Generic drug:  insulin degludec Inject 20 Units into the skin at bedtime.   Trulicity 1.5 LT/5.3UY Sopn Generic drug:  Dulaglutide Inject 1.5 mg into the skin every Friday.   vitamin A & D ointment Apply 1 application topically See admin instructions. To be applied after baths in the morning due to rash under skin folds   Vitamin D3 50 MCG (2000 UT) Tabs Take 4,000 Units by mouth every morning.        Relevant Imaging Results:  Relevant Lab Results:   Additional Information SSN [id number]  Tem[place]oluwa Recchia, Chauncey Reading, RN

## 2018-11-29 NOTE — Progress Notes (Signed)
Has been alert and oriented x 3 this morning.  Able to swallow pills and feeding self.

## 2018-11-29 NOTE — Care Management (Signed)
Fax number provided by Lavella Lemons is not working. Also tried to fax in Hub. Call to [facility], no answer. DC summary and FL2 given to EMS transport to go with DC packet and be given to facility.

## 2018-11-29 NOTE — Progress Notes (Signed)
Continues to be alert and making requests.  IV removed and discharge instructions in packet.  Have spoken with [name], staff at [facility] twice today with updates.  EMS to transport back to facility and are here now.

## 2018-11-29 NOTE — NC FL2 (Signed)
[place] LEVEL OF CARE SCREENING TOOL     IDENTIFICATION  Patient Name: [name] Birthdate: 24-Nov-1950 Sex: female Admission Date (Current Location): 11/28/2018  [place] and [place] Number:  [facility] [id number] [place] and Address:  [facility] [address], [place]      Provider Number: [id number]  Attending Physician Name and Address:  Barton Dubois, MD  Relative Name and Phone Number:  [name] [phone]    Current Level of Care: Other (Comment)(OBS) Recommended Level of Care: Family Care Home Prior Approval Number:    Date Approved/Denied:   PASRR Number:    Discharge Plan: Domiciliary (Rest home)    Current Diagnoses: Patient Active Problem List   Diagnosis Date Noted  . Altered mental state 11/28/2018  . Near syncope 11/28/2018  . Fall at home 11/28/2018  . History of seizure 11/28/2018  . 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 Pressure injury of skin 09/17/2018  . Respiratory failure ([place]) 08/16/2018  . Seizure ([place]) 08/16/2018  . Acute encephalopathy 07/27/2018  . Endotracheally intubated 07/27/2018  . Hypothyroidism 07/27/2018  . Pleuritic chest pain 07/14/2018  . Moderate persistent asthma without complication   . Type 2 diabetes mellitus ([place]) 04/19/2018  . Acute kidney injury superimposed on chronic kidney disease ([place]) 01/13/2018  . Altered mental status   . Encephalopathy 10/11/2016  . Chest pain 08/12/2016  . CKD (chronic kidney disease), stage IV ([place]) 08/12/2016  . History of pulmonary embolus (PE) 08/12/2016  . Obesity, Class III, BMI 40-49.9 (morbid obesity) ([place]) 08/12/2016  . RBBB 08/12/2016  . Positive D dimer 08/12/2016  . Cerebral infarction ([place]) 11/10/2015  . Physical deconditioning 07/01/2011  . Weakness 07/01/2011  . Diabetic foot ulcer ([place]) 05/19/2011  . Polypharmacy 02/09/2011  . BACK PAIN WITH RADICULOPATHY 05/25/2010  . Type 2 diabetes mellitus with  diabetic polyneuropathy, without long-term current use of insulin ([place]) 08/18/2009  . Chronic diastolic heart failure ([place]) 04/25/2009  . Normocytic anemia 04/14/2009  . Sleep apnea 12/11/2008  . Diabetic peripheral neuropathy ([place]) 05/04/2007  . HYPERCHOLESTEROLEMIA 10/13/2006  . Gout, unspecified 10/13/2006  . HYPOKALEMIA 10/13/2006  . Schizophrenia ([place]) 10/13/2006  . Depression 10/13/2006  . Essential hypertension 10/13/2006  . Venous (peripheral) insufficiency 10/13/2006  . RHINITIS, ALLERGIC 10/13/2006  . Asthma 10/13/2006  . Reflux esophagitis 10/13/2006  . OSTEOARTHRITIS, MULTI SITES 10/13/2006  . INCONTINENCE, URGE 10/13/2006    Orientation RESPIRATION BLADDER Height & Weight     Self, Time, Situation, Place  Normal Continent Weight: 96.1 kg Height:  5\' 3"  (160 cm)  BEHAVIORAL SYMPTOMS/MOOD NEUROLOGICAL BOWEL NUTRITION STATUS      Continent Diet(heart healthy)  AMBULATORY STATUS COMMUNICATION OF NEEDS Skin   Limited Assist Verbally Skin abrasions, Bruising                       Personal Care Assistance Level of Assistance  Bathing, Feeding, Dressing Bathing Assistance: Limited assistance Feeding assistance: Limited assistance Dressing Assistance: Limited assistance     Functional Limitations Info  Sight, Hearing, Speech Sight Info: Adequate Hearing Info: Adequate Speech Info: Adequate    SPECIAL CARE FACTORS FREQUENCY  PT (By licensed PT)     PT Frequency: 3x/week              Contractures Contractures Info: Not present    Additional Factors Info  Code Status, Allergies, Psychotropic, Insulin Sliding Scale Code Status Info: FULL CODE Allergies Info: Benztropine Mesylate; DIPHENDYDRALAMINE; CODEINE; DIAZEPAM; PENICILLIN; SULFANOMIDE;  THIORIDAZINE, LISINOPRIL Psychotropic Info: HALDOL; RISPERDAL Insulin Sliding Scale Info: SEE DC SUMMARY       Current Medications (11/29/2018):  This is the current hospital active medication list Current  Facility-Administered Medications  Medication Dose Route Frequency Provider Last Rate Last Dose  . 0.9 %  sodium chloride infusion  250 mL Intravenous PRN Johnson, Clanford L, MD      . acetaminophen (TYLENOL) tablet 650 mg  650 mg Oral Q6H PRN Johnson, Clanford L, MD       Or  . acetaminophen (TYLENOL) suppository 650 mg  650 mg Rectal Q6H PRN Johnson, Clanford L, MD      . albuterol (PROVENTIL HFA;VENTOLIN HFA) 108 (90 Base) MCG/ACT inhaler 1-2 puff  1-2 puff Inhalation Q6H PRN Johnson, Clanford L, MD      . amLODipine (NORVASC) tablet 5 mg  5 mg Oral q morning - 10a Johnson, Clanford L, MD   5 mg at 11/29/18 0803  . aspirin EC tablet 81 mg  81 mg Oral q morning - 10a Johnson, Clanford L, MD   81 mg at 11/29/18 0803  . atorvastatin (LIPITOR) tablet 40 mg  40 mg Oral QHS Johnson, Clanford L, MD      . carvedilol (COREG) tablet 6.25 mg  6.25 mg Oral BID WC Johnson, Clanford L, MD   6.25 mg at 11/29/18 0803  . dextromethorphan (DELSYM) 30 MG/5ML liquid 30 mg  30 mg Oral Daily PRN Johnson, Clanford L, MD      . docusate sodium (COLACE) capsule 100 mg  100 mg Oral q morning - 10a Johnson, Clanford L, MD   100 mg at 11/29/18 0804  . enoxaparin (LOVENOX) injection 30 mg  30 mg Subcutaneous Q24H Johnson, Clanford L, MD      . hydrALAZINE (APRESOLINE) injection 10 mg  10 mg Intravenous Q4H PRN Johnson, Clanford L, MD      . insulin aspart (novoLOG) injection 0-9 Units  0-9 Units Subcutaneous TID WC Johnson, Clanford L, MD   1 Units at 11/29/18 1131  . insulin glargine (LANTUS) injection 10 Units  10 Units Subcutaneous QHS Wynetta Emery, Clanford L, MD   10 Units at 11/28/18 2229  . levothyroxine (SYNTHROID, LEVOTHROID) tablet 25 mcg  25 mcg Oral Q0600 Johnson, Clanford L, MD      . ondansetron (ZOFRAN) tablet 4 mg  4 mg Oral Q6H PRN Johnson, Clanford L, MD       Or  . ondansetron (ZOFRAN) injection 4 mg  4 mg Intravenous Q6H PRN Johnson, Clanford L, MD      . pantoprazole (PROTONIX) EC tablet 40 mg  40 mg  Oral Daily Johnson, Clanford L, MD   40 mg at 11/29/18 0804  . polyethylene glycol (MIRALAX / GLYCOLAX) packet 17 g  17 g Oral Daily PRN Johnson, Clanford L, MD   17 g at 11/29/18 1131  . sodium chloride flush (NS) 0.9 % injection 3 mL  3 mL Intravenous Q12H Johnson, Clanford L, MD   3 mL at 11/29/18 0805  . sodium chloride flush (NS) 0.9 % injection 3 mL  3 mL Intravenous PRN Murlean Iba, MD         Discharge Medications: Please see discharge summary for a list of discharge medications.  Relevant Imaging Results:  Relevant Lab Results:   Additional Information SSN [id number]  Grant Swager, Chauncey Reading, RN

## 2018-11-29 NOTE — Evaluation (Signed)
Physical Therapy Evaluation Patient Details Name: [name] MRN: [id number] DOB: 06-19-1951 Today's Date: 11/29/2018   History of Present Illness  [name] is a 68 y.o. female with a complex psychiatric history and has had multiple recent hospitalizations, subspecialty consults and visits in the healthcare system for recurrent near syncopal episodes and catatonic spells where a definite cause has never been fully found or described. She is followed by [name] as her neurologist.  She recently saw him last month.  She is apparently being treated for a history of epilepsy since age 61 but [name] is not convinced that she has epilepsy.  She had been started on Keppra couple months ago and [name] has recently gotten her off Keppra and started her on Lamictal for its mood stabilization properties.  She has had 2 recent normal EEG studies.  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  She has had multiple MRIs imaging of the brain that have been mostly unremarkable with the exception of minimal white matter changes.  She has had multiple hospitalizations.  She was diagnosed with a stroke in December 2019 but there was no MRI evidence of acute CVA.  She has had full stroke work-up recently.    Clinical Impression  Patient demonstrates slow labored movement for sitting up at bedside, slightly unsteady gait with frequent veering to the right bumping into nearby objects resulting in frequent verbal/tactile cueing for safety.  Patient tolerated sitting up in chair after therapy - RN notified.  Patient will benefit from continued physical therapy in hospital and recommended venue below to increase strength, balance, endurance for safe ADLs and gait.    Follow Up Recommendations SNF;Supervision for mobility/OOB;Supervision/Assistance - 24 hour    Equipment  Recommendations       Recommendations for Other Services       Precautions / Restrictions Precautions Precautions: Fall Restrictions Weight Bearing Restrictions: No      Mobility  Bed Mobility Overal bed mobility: Needs Assistance Bed Mobility: Supine to Sit     Supine to sit: Min assist;Mod assist     General bed mobility comments: slow labored movement  Transfers Overall transfer level: Needs assistance Equipment used: Rolling walker (2 wheeled) Transfers: Sit to/from [facility] Sit to Stand: Min assist Stand pivot transfers: Min assist       General transfer comment: increased time, labored movement  Ambulation/Gait Ambulation/Gait assistance: Min assist Gait Distance (Feet): 75 Feet Assistive device: Rolling walker (2 wheeled) Gait Pattern/deviations: Decreased step length - right;Decreased step length - left;Decreased stride length Gait velocity: near normal   General Gait Details: unsteady labored cadence with frequent veering to the right and bumping into nearby objects, at times ambulates to fast requiring verbal cues for safety, limited secondary to fatigue  Stairs            Wheelchair Mobility    Modified Rankin (Stroke Patients Only)       Balance Overall balance assessment: Needs assistance Sitting-balance support: Feet supported;No upper extremity supported Sitting balance-Leahy Scale: Fair     Standing balance support: Bilateral upper extremity supported;During functional activity Standing balance-Leahy Scale: Fair Standing balance comment: using RW                             Pertinent Vitals/Pain Pain Assessment: No/denies pain  Home Living Family/patient expects to be discharged to:: Assisted living               Home Equipment: Wheelchair - manual      Prior Function Level of Independence: Needs assistance   Gait / Transfers Assistance Needed: household ambulator with RW  ADL's /  Homemaking Assistance Needed: assisted by ALF staff        Hand Dominance   Dominant Hand: Right    Extremity/Trunk Assessment   Upper Extremity Assessment Upper Extremity Assessment: Generalized weakness    Lower Extremity Assessment Lower Extremity Assessment: Generalized weakness    Cervical / Trunk Assessment Cervical / Trunk Assessment: Normal  Communication   Communication: No difficulties  Cognition Arousal/Alertness: Awake/alert Behavior During Therapy: WFL for tasks assessed/performed Overall Cognitive Status: Within Functional Limits for tasks assessed                                        General Comments      Exercises     Assessment/Plan    PT Assessment Patient needs continued PT services  PT Problem List Decreased strength;Decreased activity tolerance;Decreased balance;Decreased mobility       PT Treatment Interventions Therapeutic exercise;Gait training;Stair training;Functional mobility training;Therapeutic activities;Patient/family education    PT Goals (Current goals can be found in the Care Plan section)  Acute Rehab PT Goals Patient Stated Goal: return to ALF with assistance PT Goal Formulation: With patient Time For Goal Achievement: 12/13/18 Potential to Achieve Goals: Good    Frequency Min 3X/week   Barriers to discharge        Co-evaluation               AM-PAC PT "6 Clicks" Mobility  Outcome Measure Help needed turning from your back to your side while in a flat bed without using bedrails?: A Little Help needed moving from lying on your back to sitting on the side of a flat bed without using bedrails?: A Lot Help needed moving to and from a bed to a chair (including a wheelchair)?: A Little Help needed standing up from a chair using your arms (e.g., wheelchair or bedside chair)?: A Little Help needed to walk in hospital room?: A Lot Help needed climbing 3-5 steps with a railing? : A Lot 6 Click Score:  15    End of Session Equipment Utilized During Treatment: Gait belt Activity Tolerance: Patient tolerated treatment well;Patient limited by fatigue Patient left: in chair;with call bell/phone within reach Nurse Communication: Mobility status PT Visit Diagnosis: Unsteadiness on feet (R26.81);Other abnormalities of gait and mobility (R26.89);Muscle weakness (generalized) (M62.81)    Time: 6979-4801 PT Time Calculation (min) (ACUTE ONLY): 32 min   Charges:   PT Evaluation $PT Eval Moderate Complexity: 1 Mod PT Treatments $Therapeutic Activity: 23-37 mins        11:49 AM, 11/29/18 Lonell Grandchild, MPT Physical Therapist with [facility] [phone] office [phone] mobile phone

## 2018-11-29 NOTE — Discharge Summary (Signed)
Physician Discharge Summary  [name] KWI:[id number] DOB: 05-18-1951 DOA: 11/28/2018  PCP: Glenda Chroman, MD  Admit date: 11/28/2018 Discharge date: 11/29/2018  Time spent: 35 minutes  Recommendations for Outpatient Follow-up:  1. Repeat basic metabolic panel to follow electrolytes and renal function 2. Reassess blood pressure and further adjust antihypertensive regimen as needed. 3. Outpatient follow-up with psychiatry will be imperative to further adjust/titrate medication as needed.   Discharge Diagnoses:  Active Problems:   Type 2 diabetes mellitus with diabetic polyneuropathy, without long-term current use of insulin (HCC)   Diabetic peripheral neuropathy (HCC)   HYPERCHOLESTEROLEMIA   Schizophrenia ([place])   Essential hypertension   Chronic diastolic heart failure (HCC)   Venous (peripheral) insufficiency   Reflux esophagitis   Sleep apnea   CKD (chronic kidney disease), stage IV (HCC)   Altered mental status   Acute encephalopathy   Hypothyroidism   Altered mental state   Near syncope   Fall at home   History of seizure   Discharge Condition: Stable and improved.  Patient mentation and physical capacity back to baseline at time of discharge.  Will discharge to assisted living facility for further care and rehabilitation.  Diet recommendation: Heart healthy/modified carbohydrate diet  Filed Weights   11/28/18 1614  Weight: 96.1 kg    History of present illness:  As per [name] written by Dr. Wynetta Cross on 11/28/2018 68 y.o. female with a complex psychiatric history and has had multiple recent hospitalizations, subspecialty consults and visits in the healthcare system for recurrent near syncopal episodes and catatonic spells where a definite cause has never been fully found or described. She is followed by [name] as her neurologist.  She recently saw him last month.  She is apparently being treated for a history of epilepsy since age 66 but [name] is not  convinced that she has epilepsy.  She had been started on Keppra couple months ago and [name] has recently gotten her off Keppra and started her on Lamictal for its mood stabilization properties.  She has had 2 recent normal EEG studies.  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  She has had multiple MRIs imaging of the brain that have been mostly unremarkable with the exception of minimal white matter changes.  She has had multiple hospitalizations.  She was diagnosed with a stroke in December 2019 but there was no MRI evidence of acute CVA.  She has had full stroke work-up recently.  The patient resides at [facility] and had an unwitnessed fall at 4 AM.  She was noted to have some bruising to the left arm and left hand and chin.  She apparently was speaking with her counselors at around 10 AM this morning and developed an acute episode where she stopped talking and "slumped over" and became confused and came close to losing consciousness.  This is similar to multiple episodes that she has had in the past where she has become catatonic and this lasted for several hours and then she returns to her baseline.  These episodes have been present since she was a child.  She has been evaluated by psychiatry.  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  The patient is unable to give history but apparently has been taking a course of Levaquin for 10 days according to nursing home  records.  It is not sure if she is being treated for UTI or pneumonia as her urine and chest x-ray appear normal.  ED course: The patient was evaluated in the ED and had normal alcohol level negative drug screen.  The patient had imaging of the chest and hand with no acute findings.  The patient had a CT of cervical spine and CT of the  head with no acute findings.  Urinalysis negative for active infection.  Blood sugar 271.  Creatinine 2.49.  White blood cell count 9.9 and hemoglobin 9.0 which are similar to baseline labs.  TSH was normal at 2.950.  The patient was monitored for several hours in the emergency department and has remained in a catatonic state.  Observation in the hospital was requested for consideration of a neurology consultation and further work-up as indicated.   Hospital Course:  1-near syncopal episode with altered mentation -Patient appears catatonic and her presentation on admission was similar to many other episodes that she has presented with during multiple other admissions. -CT scan of the brain demonstrated no acute abnormalities. -Following recommendations by neurology service no further work-up has been recommended; psychotropic agents has been discontinued. -Patient mentation rapidly improved overnight and is back to baseline. -Outpatient follow-up with psychiatry as recommended.  2-frequent falls -Evaluated by physical therapy; essentially back to baseline -Will be discharged to assisted living facility to continue rehabilitation and further care. -Continue using rolling walker requiring assistance while transitioning positions.  3-acute encephalopathy -No signs of acute infection appreciated -Appreciate neurology recommendations -Psychotropic medication has been discontinued -Maintain adequate hydration -Follow-up with psychiatry as an outpatient.  4-hypothyroidism -TSH is normal; continue the use of levothyroxine  5-essential hypertension -Stable and well-controlled -Will resume home antihypertensive regimen.  6-stage IV chronic kidney disease -Patient reports good urine output and denies dysuria -Creatinine appears to be at baseline -Repeat basic metabolic panel follow-up visit to reassess electrolytes and renal function trend.  7-type 2 diabetes mellitus -Resume home  hypoglycemic regimen -Modify carbohydrates diet has been recommended. -Close follow-up as an outpatient for further adjustment to her medications based on CBGs/A1c fluctuation.  8-gastroesophageal reflux disease -Continue PPI.  9-hyperlipidemia -Continue statins  32-IZTIWPY diastolic heart failure -Euvolemic currently -Resume home diuretics regimen -Follow daily weights and heart healthy/low-sodium diet.  11-obstructive sleep apnea -Continue CPAP  12-schizoaffective disorder/epilepsy -Following recommendations by neurology antiepileptic drugs and psychotropic medication has been discontinued -Will recommend outpatient follow-up with psychiatry service -Resume outpatient follow-up with neurology.    Procedures:  See below for x-ray reports.  Consultations:  Neurology  Discharge Exam: Vitals:   11/28/18 2353 11/29/18 0521  BP: (!) 146/77 (!) 160/92  Pulse: 75 72  Resp: 16 16  Temp: 98 F (36.7 C) 98.4 F (36.9 C)  SpO2: 100% 97%    General: Afebrile, no chest pain, no nausea, no vomiting, no shortness of breath.  Patient is oriented x3 currently and was able to recollect information of most recent events leading her to come to the hospital (referring mainly to her frequent falls). Cardiovascular: S1 and S2, no rubs, no gallops; no JVD on exam (even difficult to properly assess due to body habitus). Respiratory: Good air movement bilaterally, no wheezing, no crackles. Abdomen: Obese, nontender, nondistended, positive bowel sounds Extremities: No cyanosis, no clubbing.  Discharge Instructions   Discharge Instructions    Diet - low sodium heart healthy   Complete by:  As directed    Discharge instructions   Complete by:  As directed  Follow heart healthy and modify carbohydrates diet Maintain adequate hydration Take medications as prescribed Outpatient follow-up with PCP in 1 week     Allergies as of 11/29/2018      Reactions   Benztropine Mesylate Other  (See Comments)   Alopecia and white spots on eyes   Codeine Swelling, Palpitations, Other (See Comments)   Mouth Swelling and Tachycardia   Diazepam Other (See Comments)   COMA   Diphenhydramine Hcl Anxiety, Palpitations, Other (See Comments)   Tachycardia and anxiousness   Penicillins Swelling, Other (See Comments)   REACTION: ulcers in mouth, swelling, throat swelling  Has patient had a PCN reaction causing immediate rash, facial/tongue/throat swelling, SOB or lightheadedness with hypotension: YES Has patient had a PCN reaction causing severe rash involving Mucus membranes or skin necrosis: Unknown Has patient had a PCN reaction that required hospitalization Unknown Has patient had a PCN reaction occurring within the last 10 years: unknown If all of the above answers are "NO", then may proceed with Cephalospori   Sulfonamide Derivatives Rash   Blisters, also   Thioridazine Hcl Swelling   Lisinopril Cough      Medication List    STOP taking these medications   haloperidol 0.5 MG tablet Commonly known as:  HALDOL   lamoTRIgine 25 MG tablet Commonly known as:  LAMICTAL   levofloxacin 500 MG tablet Commonly known as:  LEVAQUIN   loperamide 2 MG capsule Commonly known as:  IMODIUM   phenazopyridine 100 MG tablet Commonly known as:  PYRIDIUM   predniSONE 10 MG tablet Commonly known as:  DELTASONE   risperiDONE 0.5 MG tablet Commonly known as:  RISPERDAL     TAKE these medications   acetaminophen 325 MG tablet Commonly known as:  TYLENOL Take 325 mg by mouth 2 (two) times daily.   albuterol (2.5 MG/3ML) 0.083% nebulizer solution Commonly known as:  PROVENTIL Take 2.5 mg by nebulization every 6 (six) hours as needed for wheezing.   Ventolin HFA 108 (90 Base) MCG/ACT inhaler Generic drug:  albuterol Inhale 1-2 puffs into the lungs every 6 (six) hours as needed for wheezing or shortness of breath.   amLODipine 5 MG tablet Commonly known as:  NORVASC Take 1 tablet  (5 mg total) by mouth daily. What changed:  when to take this   aspirin 81 MG tablet Take 81 mg by mouth every morning.   atorvastatin 40 MG tablet Commonly known as:  LIPITOR Take 40 mg by mouth at bedtime.   carvedilol 6.25 MG tablet Commonly known as:  COREG Take 1 tablet (6.25 mg total) by mouth 2 (two) times daily with a meal.   DAILY VITE PO Take 1 tablet by mouth every morning.   dextromethorphan 30 MG/5ML liquid Commonly known as:  DELSYM Take 30 mg by mouth daily as needed for cough.   docusate sodium 100 MG capsule Commonly known as:  COLACE Take 1 capsule (100 mg total) by mouth 2 (two) times daily. Hold for diarrhea What changed:    when to take this  additional instructions   EPINEPHrine 0.3 mg/0.3 mL Devi Commonly known as:  EPI-PEN Inject 0.3 mg into the muscle once as needed (for bee stings, then go to the ED immediately after using).   gabapentin 300 MG capsule Commonly known as:  NEURONTIN Take 1 capsule (300 mg total) by mouth 2 (two) times daily. What changed:    how much to take  when to take this  additional instructions   glipiZIDE 10  MG 24 hr tablet Commonly known as:  GLUCOTROL XL Take 1 tablet (10 mg total) by mouth daily with breakfast. What changed:    medication strength  how much to take  when to take this   Glycerin (Laxative) 5.4 GM/DOSE Enem Commonly known as:  Fleet Liquid Glycerin Supp Place 1 enema rectally daily as needed (for severe constipation). What changed:    medication strength  how much to take  reasons to take this   hydrocortisone 2.5 % ointment Apply 1 application topically daily as needed (itchy skin).   levothyroxine 25 MCG tablet Commonly known as:  SYNTHROID, LEVOTHROID Take 1 tablet (25 mcg total) by mouth daily at 6 (six) AM.   linagliptin 5 MG Tabs tablet Commonly known as:  TRADJENTA Take 5 mg by mouth every morning.   montelukast 10 MG tablet Commonly known as:  SINGULAIR Take 10  mg by mouth at bedtime.   omeprazole 20 MG capsule Commonly known as:  PRILOSEC Take 20 mg by mouth every morning.   polyethylene glycol powder 17 GM/SCOOP powder Commonly known as:  GLYCOLAX/MIRALAX Take 17 g by mouth daily as needed (for constipation).   primidone 50 MG tablet Commonly known as:  MYSOLINE Take 50 mg by mouth every morning.   pyridOXINE 100 MG tablet Commonly known as:  VITAMIN B-6 Take 200 mg by mouth 2 (two) times daily.   spironolactone 25 MG tablet Commonly known as:  Aldactone Take 1 tablet (25 mg total) by mouth daily. What changed:  when to take this   torsemide 20 MG tablet Commonly known as:  DEMADEX Take 60 mg alternating with 40 mg daily. What changed:    how much to take  how to take this  when to take this  additional instructions   Tresiba FlexTouch 100 UNIT/ML Sopn FlexTouch Pen Generic drug:  insulin degludec Inject 20 Units into the skin at bedtime.   Trulicity 1.5 ZO/1.0RU Sopn Generic drug:  Dulaglutide Inject 1.5 mg into the skin every Friday.   vitamin A & D ointment Apply 1 application topically See admin instructions. To be applied after baths in the morning due to rash under skin folds   Vitamin D3 50 MCG (2000 UT) Tabs Take 4,000 Units by mouth every morning.      Allergies  Allergen Reactions  . Benztropine Mesylate Other (See Comments)    Alopecia and white spots on eyes  . Codeine Swelling, Palpitations and Other (See Comments)    Mouth Swelling and Tachycardia   . Diazepam Other (See Comments)    COMA  . Diphenhydramine Hcl Anxiety, Palpitations and Other (See Comments)    Tachycardia and anxiousness  . Penicillins Swelling and Other (See Comments)    REACTION: ulcers in mouth, swelling, throat swelling  Has patient had a PCN reaction causing immediate rash, facial/tongue/throat swelling, SOB or lightheadedness with hypotension: YES Has patient had a PCN reaction causing severe rash involving Mucus  membranes or skin necrosis: Unknown Has patient had a PCN reaction that required hospitalization Unknown Has patient had a PCN reaction occurring within the last 10 years: unknown If all of the above answers are "NO", then may proceed with Cephalospori  . Sulfonamide Derivatives Rash    Blisters, also  . Thioridazine Hcl Swelling  . Lisinopril Cough   Follow-up Information    Vyas, Dhruv B, MD Follow up.   Specialty:  Internal Medicine Why:  12/08/18 at 12:00 for hospital follow up Contact information: [place]  [place] [address] [phone]           The results of significant diagnostics from this hospitalization (including imaging, microbiology, ancillary and laboratory) are listed below for reference.    Significant Diagnostic Studies: Dg Chest 1 View  Result Date: 11/28/2018 CLINICAL DATA:  Unwitnessed fall. EXAM: CHEST  1 VIEW COMPARISON:  09/18/2018 FINDINGS: The patient is mildly rotated to the right. The cardiac silhouette appears mildly enlarged although it is accentuated by AP technique and low lung volumes. No airspace consolidation, edema, pleural effusion, pneumothorax is identified. No acute osseous abnormality is seen. IMPRESSION: Low lung volumes without evidence of acute airspace disease. Electronically Signed   By: Logan Bores M.D.   On: 11/28/2018 12:17   Ct Head Wo Contrast  Result Date: 11/28/2018 CLINICAL DATA:  68 year old female with history of unwitnessed fall. Bruising in the left arm, left hand and chin. Altered consciousness. EXAM: CT HEAD WITHOUT CONTRAST CT CERVICAL SPINE WITHOUT CONTRAST TECHNIQUE: Multidetector CT imaging of the head and cervical spine was performed following the standard protocol without intravenous contrast. Multiplanar CT image reconstructions of the cervical spine were also generated. COMPARISON:  Head CT a 09/15/2018.  MRI of the brain 09/15/2018. FINDINGS: CT HEAD FINDINGS Brain: Mild cerebral atrophy. Patchy areas of mild  decreased attenuation are noted throughout the deep and periventricular white matter of the cerebral hemispheres bilaterally, compatible with mild chronic microvascular ischemic disease. No evidence of acute infarction, hemorrhage, hydrocephalus, extra-axial collection or mass lesion/mass effect. Vascular: No hyperdense vessel or unexpected calcification. Skull: Normal. Negative for fracture or focal lesion. Sinuses/Orbits: No acute finding. Multifocal mucosal thickening throughout the maxillary sinuses bilaterally, as well as a 1.8 cm low-attenuation lesion in the left maxillary sinus, compatible with a small mucosal retention cyst or polyp. Other: None. CT CERVICAL SPINE FINDINGS Alignment: Normal. Skull base and vertebrae: No acute fracture. No primary bone lesion or focal pathologic process. Soft tissues and spinal canal: No prevertebral fluid or swelling. No visible canal hematoma. Disc levels: Mild multilevel degenerative disc disease, most apparent at C5-C6 and C6-C7. Mild multilevel facet arthropathy. Upper chest: Unremarkable. Other: None. IMPRESSION: 1. No no acute intracranial abnormality. 2. No findings to suggest significant acute traumatic injury to the cervical spine. 3. Mild cerebral atrophy with mild chronic microvascular ischemic changes in the cerebral white matter, similar to prior examinations. 4. Additional incidental findings, as above. Electronically Signed   By: Vinnie Langton M.D.   On: 11/28/2018 12:10   Ct Cervical Spine Wo Contrast  Result Date: 11/28/2018 CLINICAL DATA:  68 year old female with history of unwitnessed fall. Bruising in the left arm, left hand and chin. Altered consciousness. EXAM: CT HEAD WITHOUT CONTRAST CT CERVICAL SPINE WITHOUT CONTRAST TECHNIQUE: Multidetector CT imaging of the head and cervical spine was performed following the standard protocol without intravenous contrast. Multiplanar CT image reconstructions of the cervical spine were also generated.  COMPARISON:  Head CT a 09/15/2018.  MRI of the brain 09/15/2018. FINDINGS: CT HEAD FINDINGS Brain: Mild cerebral atrophy. Patchy areas of mild decreased attenuation are noted throughout the deep and periventricular white matter of the cerebral hemispheres bilaterally, compatible with mild chronic microvascular ischemic disease. No evidence of acute infarction, hemorrhage, hydrocephalus, extra-axial collection or mass lesion/mass effect. Vascular: No hyperdense vessel or unexpected calcification. Skull: Normal. Negative for fracture or focal lesion. Sinuses/Orbits: No acute finding. Multifocal mucosal thickening throughout the maxillary sinuses bilaterally, as well as a 1.8 cm low-attenuation lesion in the left maxillary sinus, compatible with a small  mucosal retention cyst or polyp. Other: None. CT CERVICAL SPINE FINDINGS Alignment: Normal. Skull base and vertebrae: No acute fracture. No primary bone lesion or focal pathologic process. Soft tissues and spinal canal: No prevertebral fluid or swelling. No visible canal hematoma. Disc levels: Mild multilevel degenerative disc disease, most apparent at C5-C6 and C6-C7. Mild multilevel facet arthropathy. Upper chest: Unremarkable. Other: None. IMPRESSION: 1. No no acute intracranial abnormality. 2. No findings to suggest significant acute traumatic injury to the cervical spine. 3. Mild cerebral atrophy with mild chronic microvascular ischemic changes in the cerebral white matter, similar to prior examinations. 4. Additional incidental findings, as above. Electronically Signed   By: Vinnie Langton M.D.   On: 11/28/2018 12:10   Dg Hand Complete Left  Result Date: 11/28/2018 CLINICAL DATA:  68 year old female with a fall EXAM: LEFT HAND - COMPLETE 3+ VIEW COMPARISON:  06/03/2018 FINDINGS: Osteopenia. No acute displaced fracture. No radiopaque foreign body. Degenerative changes of the interphalangeal joints. No subluxation/dislocation. No erosive changes. Degenerative  changes of the first carpometacarpal joint. IMPRESSION: Osteopenia, with no acute bony abnormality identified. Osteoarthritis Electronically Signed   By: Corrie Mckusick D.O.   On: 11/28/2018 12:15    Microbiology: Recent Results (from the past 240 hour(s))  MRSA PCR Screening     Status: None   Collection Time: 11/29/18 12:01 AM  Result Value Ref Range Status   MRSA by PCR NEGATIVE NEGATIVE Final    Comment:        The GeneXpert MRSA Assay (FDA approved for NASAL specimens only), is one component of a comprehensive MRSA colonization surveillance program. It is not intended to diagnose MRSA infection nor to guide or monitor treatment for MRSA infections. Performed at [facility], [address] [place], [place] [address]      Labs: Basic Metabolic Panel: Recent Labs  Lab 11/28/18 1147  NA 139  K 4.1  CL 105  CO2 24  GLUCOSE 271*  BUN 41*  CREATININE 2.49*  CALCIUM 8.5*   Liver Function Tests: Recent Labs  Lab 11/28/18 1147  AST 21  ALT 23  ALKPHOS 89  BILITOT 0.6  PROT 6.8  ALBUMIN 3.4*   CBC: Recent Labs  Lab 11/28/18 1147  WBC 9.9  NEUTROABS 8.8*  HGB 9.0*  HCT 28.4*  MCV 96.9  PLT 219   BNP: BNP (last 3 results) Recent Labs    01/12/18 1056 08/15/18 1503  BNP 145.3* 100.0    CBG: Recent Labs  Lab 11/28/18 1136 11/28/18 1642 11/28/18 2147 11/29/18 0729 11/29/18 1125  GLUCAP 258* 187* 113* 67* 141*    Signed:  Barton Dubois MD.  [facility] 11/29/2018, 2:45 PM

## 2018-11-29 NOTE — TOC Transition Note (Addendum)
Transition of Care [facility]) - CM/SW Discharge Note   Patient Details  Name: [name] MRN: [id number] Date of Birth: 06/21/1951  Transition of Care [facility]) CM/SW Contact:  Chaquita Basques, Chauncey Reading, RN Phone Number: 11/29/2018, 1:55 PM   Clinical Narrative: Patient admitted with catatonic state and fall. She is back to baseline, alert and oriented, asking to go back to [employer]. Has been seen by PT and recommendation is SNF vs supervision at care home if they can take care of patient. Spoke with Lavella Lemons of [facility] and discussed PT eval, patient walked 75 feet minimum assist with RW. This is patient's baseline per patient and Tanya. Offered home health options, but Lavella Lemons reports that patient just finished PT services and staff works with patient daily with her walking and PT exercises including foot pumps and that they would not want any extra people coming into the [facility].  Patient will need EMS transport back to [facility] per Lavella Lemons.   Will arrange for transport when DC summary has been completed. Lavella Lemons asks that patient come back with a mask on, bedside RN Izora Gala notified. Patient is in Observation status and DC'd in less than 24 hours, so no FL2 needed. [place] notified.    ADDENDUM: Lavella Lemons ask about about a wider bed for patient. Call to [facility], patient just received a hospital bed this year and was based on her weight. She is in the appropriate bed for her weight and insurance will not pay for another bed at this time. Tanya updated. EMS arranged. Bedside RN filling medical necessity and will have mask on patient for return to facility. Manually faxed DC summary to [phone] per [facility] request as hub fax is not working at this time.  Also completed FL2 and faxed manually.    Final next level of care: Rest Home Barriers to Discharge: No Barriers Identified   Patient Goals and CMS Choice Patient states their goals for this hospitalization and ongoing recovery are:: get back  [facility]  CMS Medicare.gov Compare Post Acute Care list provided to:: Patient Choice offered to / list presented to : Patient   Discharge Plan and Services   Discharge Planning Services: CM Consult, Follow-up appt scheduled                   Readmission Risk Interventions Readmission Risk Prevention Plan 11/29/2018  Transportation Screening Complete  Medication Review ([place]) Complete  HRI or [place] Complete  SW Recovery Care/Counseling Consult Complete  Palliative Care Screening Not [place] Not Applicable  Some recent data might be hidden

## 2018-11-29 NOTE — Plan of Care (Signed)
  Problem: Acute Rehab PT Goals(only PT should resolve) Goal: Pt Will Go Supine/Side To Sit Outcome: Progressing Flowsheets (Taken 11/29/2018 1152) Pt will go Supine/Side to Sit: with minimal assist Goal: Patient Will Transfer Sit To/From Stand Outcome: Progressing Flowsheets (Taken 11/29/2018 1152) Patient will transfer sit to/from stand: with min guard assist Goal: Pt Will Transfer Bed To Chair/Chair To Bed Outcome: Progressing Flowsheets (Taken 11/29/2018 1152) Pt will Transfer Bed to Chair/Chair to Bed: min guard assist Goal: Pt Will Ambulate Outcome: Progressing Flowsheets (Taken 11/29/2018 1152) Pt will Ambulate: 100 feet; with rolling walker; with min guard assist; with minimal assist   11:52 AM, 11/29/18 Lonell Grandchild, MPT Physical Therapist with [facility] [phone] office [phone] mobile phone

## 2018-11-30 ENCOUNTER — Telehealth: Payer: Medicare Other | Admitting: Cardiology

## 2018-12-01 ENCOUNTER — Telehealth: Payer: Self-pay | Admitting: *Deleted

## 2018-12-01 NOTE — Care Management (Signed)
12/01/18   Received VM from [facility] this morning stating no prescriptions had been sent to patient's pharmacy ([facility]).  Return call to Tanya, no answer, no option to leave VM.   Pulled up patient's pharmacy info,  [facility] is patient's listed pharmacy. Call to [facility]. They report patient did not have any prescriptions sent this week.   Reviewed AVS, no new medications were prescribed.   Call to [facility], they report they have been in contact with Tanya. CM went over AVS with [facility], including that there were no new medications.  Faxed over FL2 and AVS to [facility] for clarification.   Of note, FL2 was faxed in hub and manually faxed to number provided by Tanya on 11/29/18 per request.  When CM noticed that manual fax was not going through, CM called Tanya and was unable to reach her. No option to leave VM.  CM sent DC summary and completed FL2 (which was not required as patient was in OBS and left within 24 hours) with patient in her DC packet to [facility].

## 2018-12-08 DIAGNOSIS — I10 Essential (primary) hypertension: Secondary | ICD-10-CM | POA: Diagnosis not present

## 2018-12-08 DIAGNOSIS — F209 Schizophrenia, unspecified: Secondary | ICD-10-CM | POA: Diagnosis not present

## 2018-12-08 DIAGNOSIS — E1122 Type 2 diabetes mellitus with diabetic chronic kidney disease: Secondary | ICD-10-CM | POA: Diagnosis not present

## 2018-12-08 DIAGNOSIS — Z299 Encounter for prophylactic measures, unspecified: Secondary | ICD-10-CM | POA: Diagnosis not present

## 2018-12-08 DIAGNOSIS — Z6838 Body mass index (BMI) 38.0-38.9, adult: Secondary | ICD-10-CM | POA: Diagnosis not present

## 2018-12-08 DIAGNOSIS — Z09 Encounter for follow-up examination after completed treatment for conditions other than malignant neoplasm: Secondary | ICD-10-CM | POA: Diagnosis not present

## 2018-12-25 DIAGNOSIS — M79672 Pain in left foot: Secondary | ICD-10-CM | POA: Diagnosis not present

## 2018-12-25 DIAGNOSIS — L89892 Pressure ulcer of other site, stage 2: Secondary | ICD-10-CM | POA: Diagnosis not present

## 2018-12-25 DIAGNOSIS — E1140 Type 2 diabetes mellitus with diabetic neuropathy, unspecified: Secondary | ICD-10-CM | POA: Diagnosis not present

## 2018-12-25 DIAGNOSIS — M79671 Pain in right foot: Secondary | ICD-10-CM | POA: Diagnosis not present

## 2018-12-27 IMAGING — CR DG CHEST 1V PORT
1 series · 1 of 1 positions shown · non-contrast
Comparison: 04/18/2018

CLINICAL DATA: Chest pain and shortness of breath

EXAM:
PORTABLE CHEST 1 VIEW

[portable]
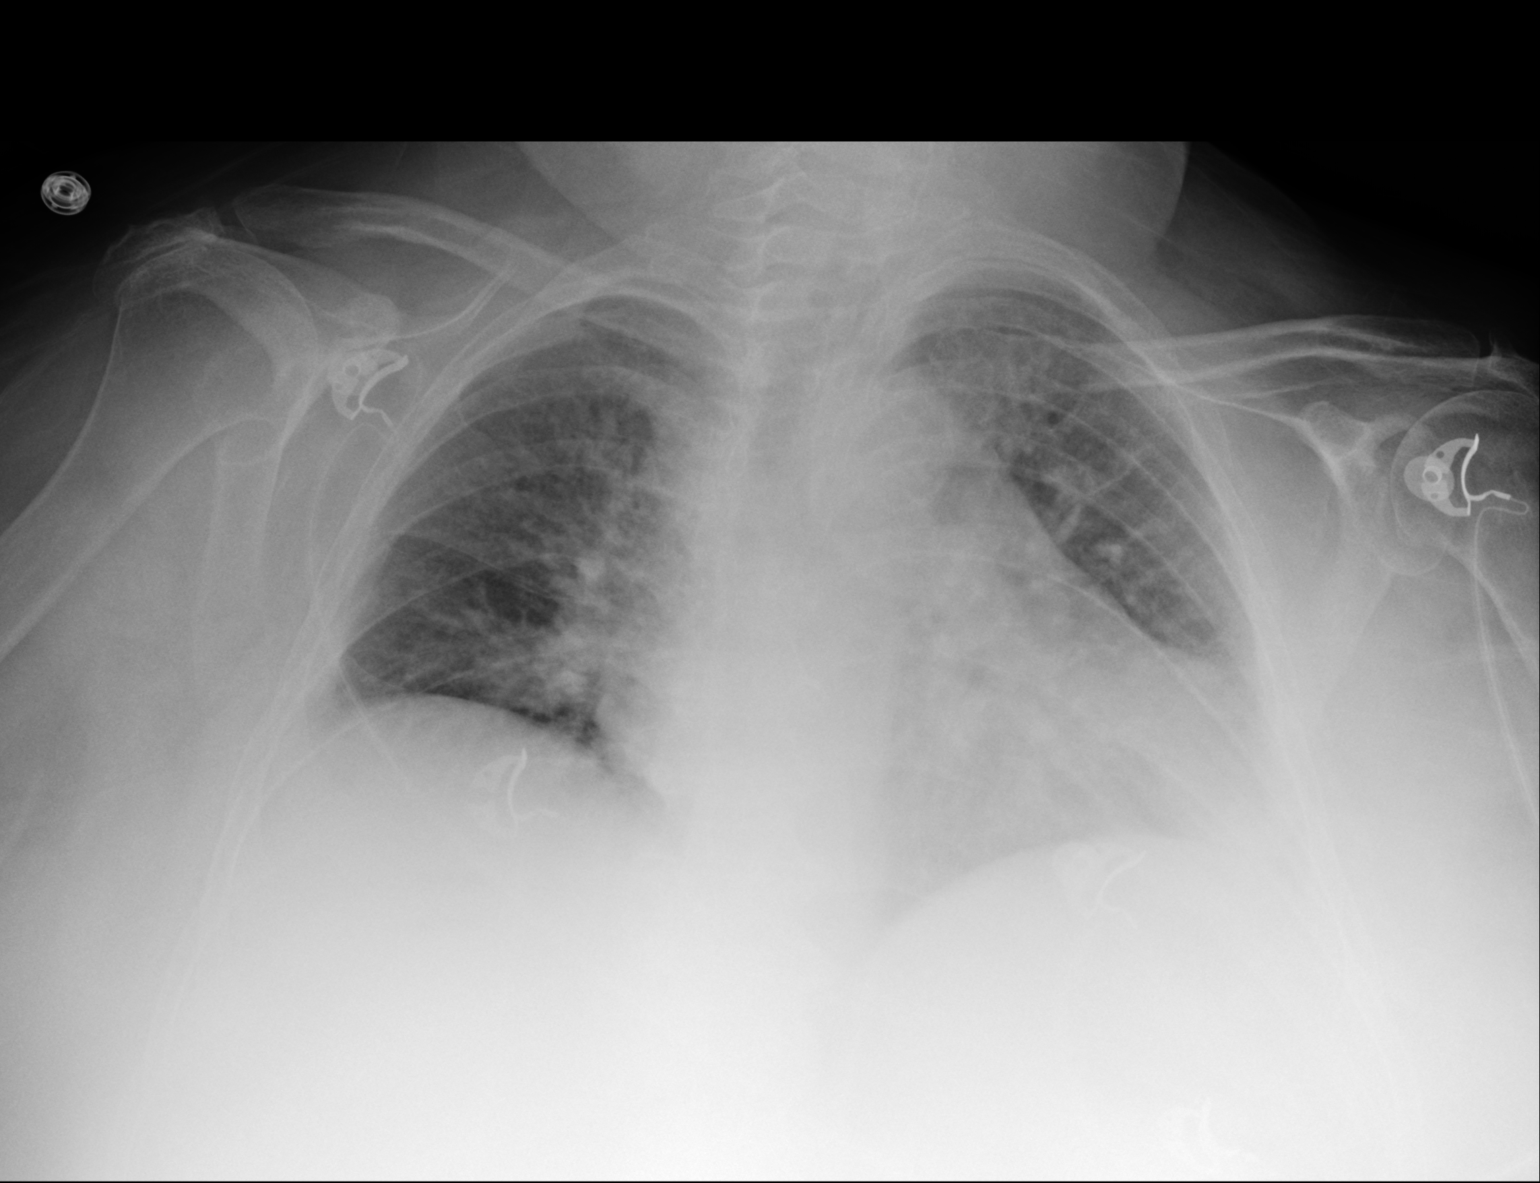

[1 of 1 positions shown; findings below may reference images not displayed]

FINDINGS: There is shallow lung inflation with pulmonary vascular congestion.
Mild cardiomegaly. No pleural effusion or pneumothorax. No focal
airspace consolidation.
IMPRESSION: Cardiomegaly and pulmonary vascular congestion.

## 2018-12-28 IMAGING — US US EXTREM LOW VENOUS BILAT
1 series · 14 of 24 positions shown · non-contrast
Comparison: None.

CLINICAL DATA: Elevated D-dimer

EXAM:
BILATERAL LOWER EXTREMITY VENOUS DUPLEX ULTRASOUND
TECHNIQUE: Doppler venous assessment of the bilateral lower extremity deep
venous system was performed, including characterization of spectral
flow, compressibility, and plasticity.

[Series 1: us extrem low venous bilat · 14 of 64 slices shown]
[im 1/64]
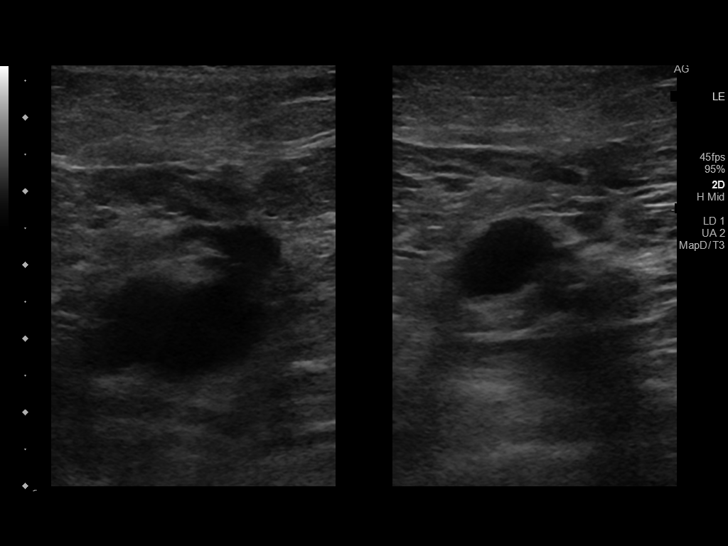
[im 6/64]
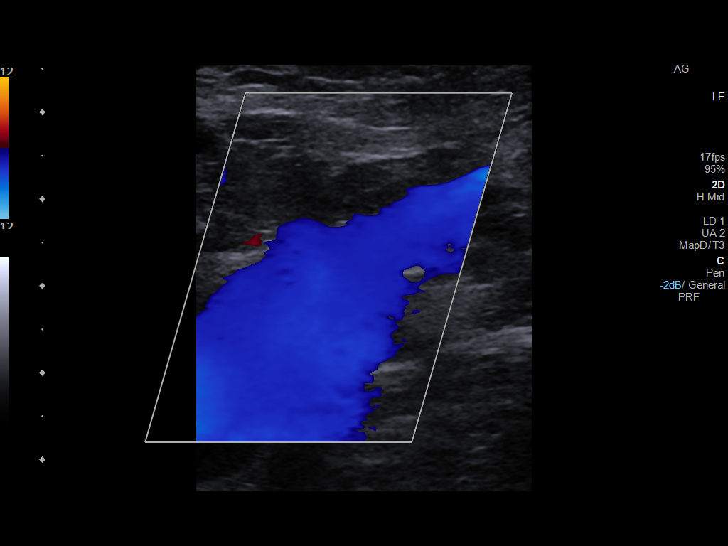
[im 11/64]
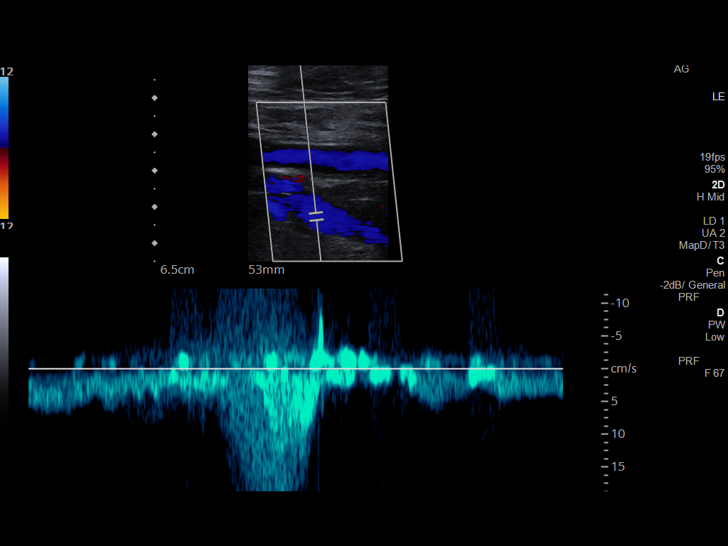
[im 17/64]
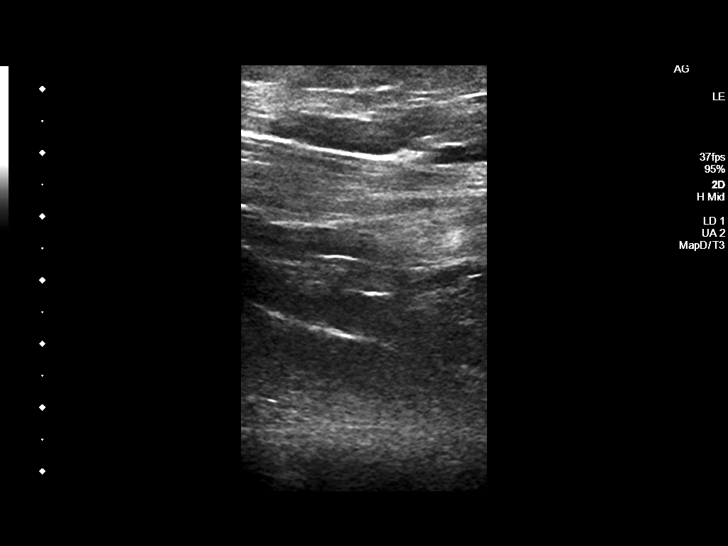
[im 20/64]
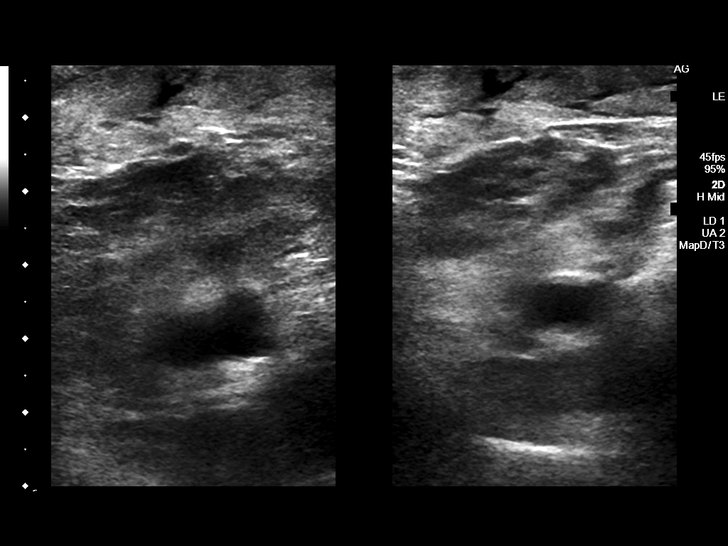
[im 25/64]
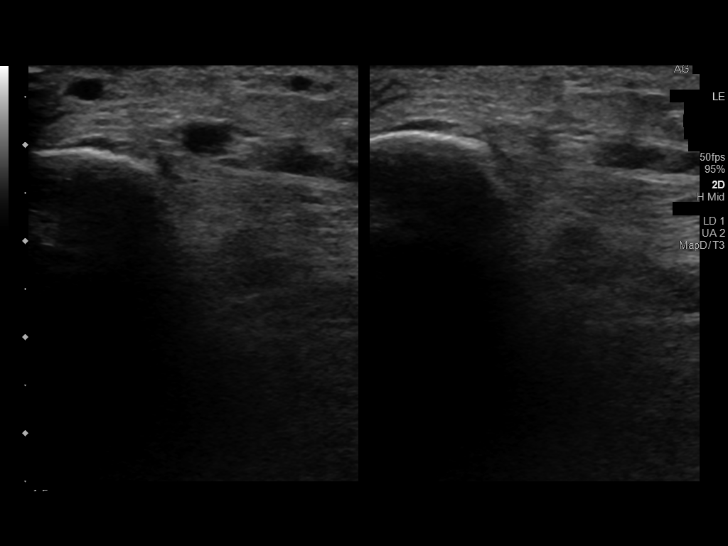
[im 31/64]
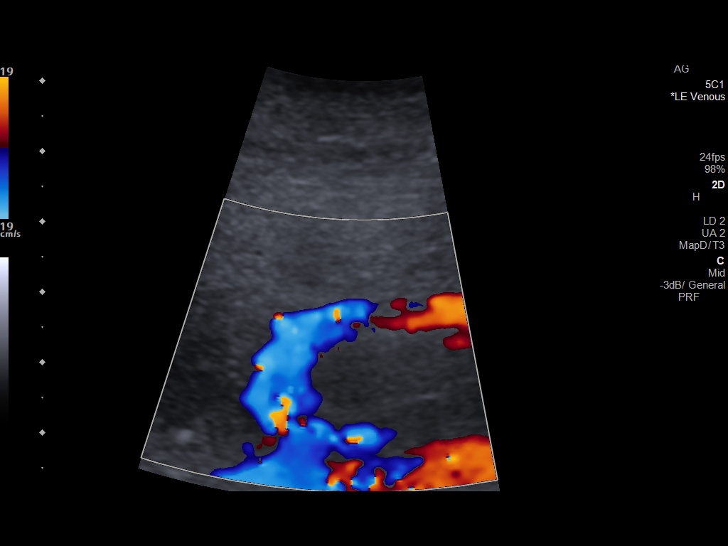
[im 33/64]
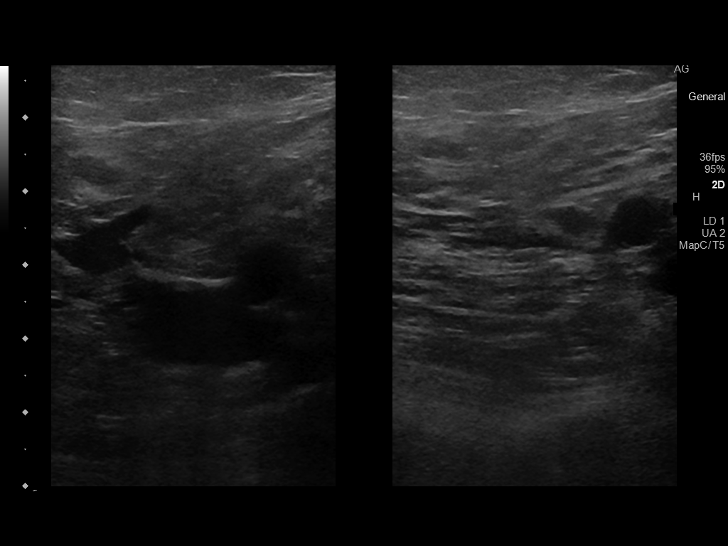
[im 39/64]
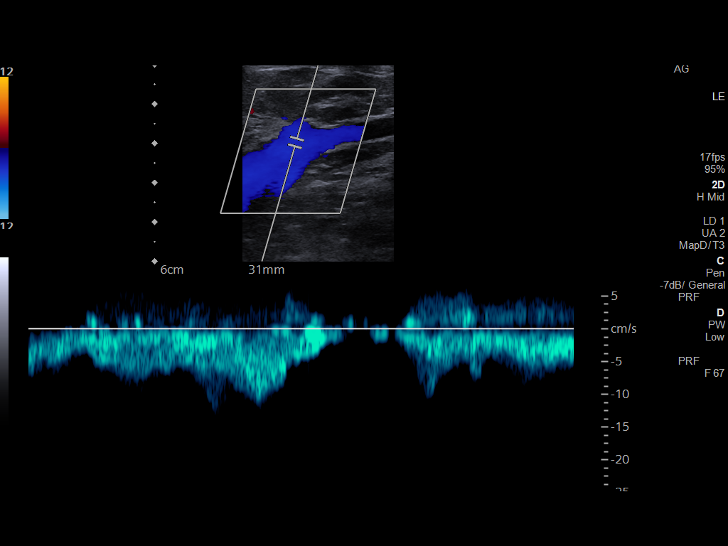
[im 44/64]
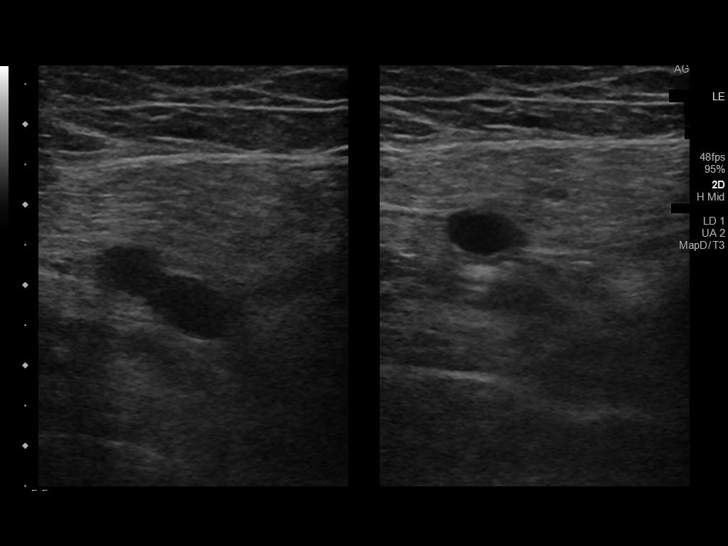
[im 50/64]
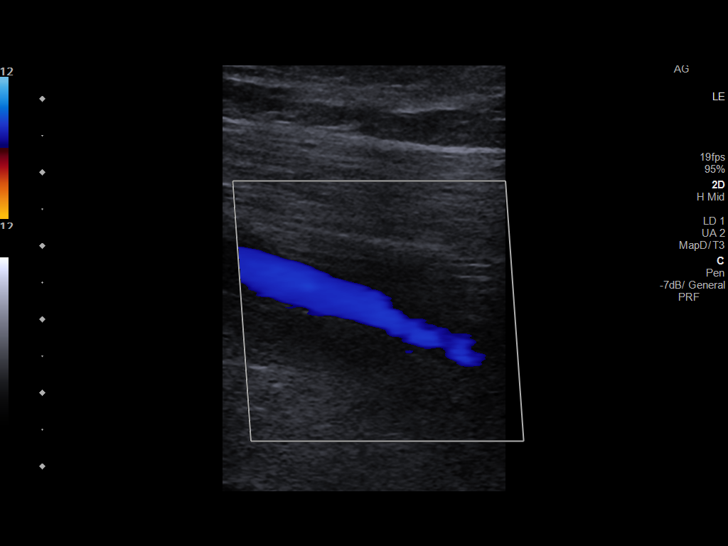
[im 53/64]
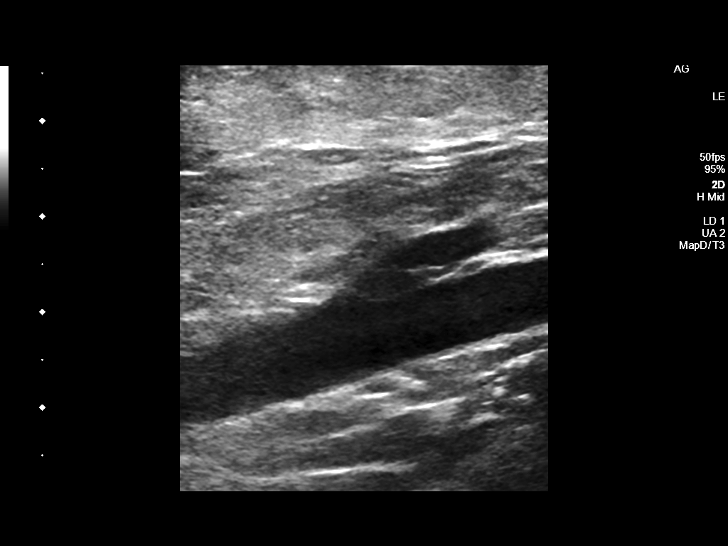
[im 58/64]
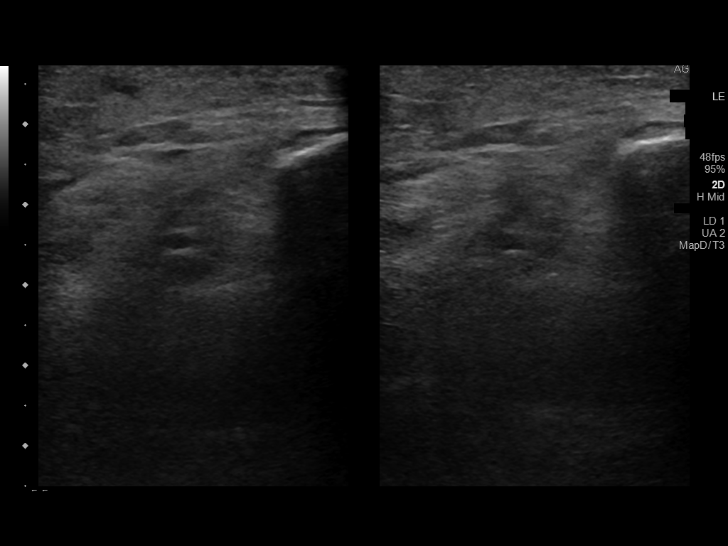
[im 64/64]
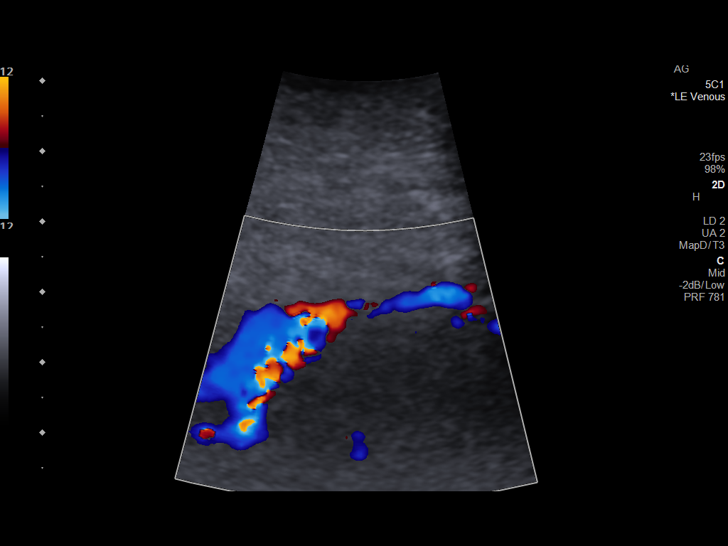

[14 of 24 positions shown; findings below may reference images not displayed]

FINDINGS: There is complete compressibility of the bilateral common femoral,
femoral, and popliteal veins. Doppler analysis demonstrates
respiratory phasicity and augmentation of flow with calf
compression. No obvious superficial vein or calf vein thrombosis.
IMPRESSION: No evidence of lower extremity DVT

## 2018-12-28 IMAGING — NM NM PULMONARY VENT & PERF
16 series · 16 of 16 positions shown · non-contrast
Comparison: None.

CLINICAL DATA: Intermediate probability for pulmonary embolus.
Positive D-dimer.

EXAM:
NUCLEAR MEDICINE VENTILATION - PERFUSION LUNG SCAN
TECHNIQUE: Ventilation images were obtained in multiple projections using
inhaled aerosol Zc-JJm DTPA. Perfusion images were obtained in
multiple projections after intravenous injection of Zc-JJm MAA.
RADIOPHARMACEUTICALS:  30.1 mCi of Zc-JJm DTPA aerosol inhalation
and 4 mCi Hc11m MAA IV

[Series 1: ant/post vent · 4.14mm/px · 1 of 1 slices shown (1 of 2)]
[im 1/1]
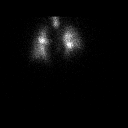

[Series 1: ant/post vent · 4.14mm/px · 1 of 1 slices shown (2 of 2)]
[im 1/1]
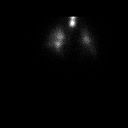

[Series 2: lao/rpo vent · 4.14mm/px · 1 of 1 slices shown (1 of 2)]
[im 1/1]
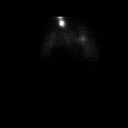

[Series 2: lao/rpo vent · 4.14mm/px · 1 of 1 slices shown (2 of 2)]
[im 1/1]
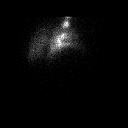

[Series 3: lt lat/rt lat vent · 4.14mm/px · 1 of 1 slices shown (1 of 2)]
[im 1/1]
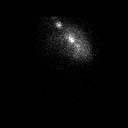

[Series 3: lt lat/rt lat vent · 4.14mm/px · 1 of 1 slices shown (2 of 2)]
[im 1/1]
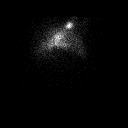

[Series 4: lpo/rao vent · 4.14mm/px · 1 of 1 slices shown (1 of 2)]
[im 1/1]
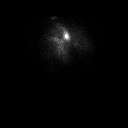

[Series 4: lpo/rao vent · 4.14mm/px · 1 of 1 slices shown (2 of 2)]
[im 1/1]
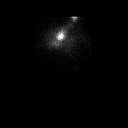

[Series 5: ant/post perf · 4.14mm/px · 1 of 1 slices shown (1 of 2)]
[im 1/1]
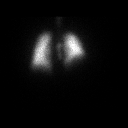

[Series 5: ant/post perf · 4.14mm/px · 1 of 1 slices shown (2 of 2)]
[im 1/1]
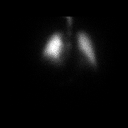

[Series 6: lao/rpo perf · 4.14mm/px · 1 of 1 slices shown (1 of 2)]
[im 1/1]
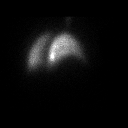

[Series 6: lao/rpo perf · 4.14mm/px · 1 of 1 slices shown (2 of 2)]
[im 1/1]
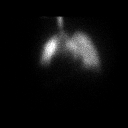

[Series 7: lt lat/rt lat perf · 4.14mm/px · 1 of 1 slices shown (1 of 2)]
[im 1/1]
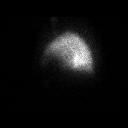

[Series 7: lt lat/rt lat perf · 4.14mm/px · 1 of 1 slices shown (2 of 2)]
[im 1/1]
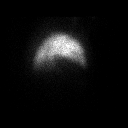

[Series 8: lpo/rao perf · 4.14mm/px · 1 of 1 slices shown (1 of 2)]
[im 1/1]
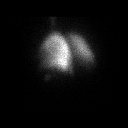

[Series 8: lpo/rao perf · 4.14mm/px · 1 of 1 slices shown (2 of 2)]
[im 1/1]
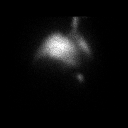

[16 of 16 positions shown; findings below may reference images not displayed]

FINDINGS: Ventilation: There is a clumping of the radiopharmaceutical within
the central airways compatible with turbulent air flow secondary to
COPD. No wedge-shaped ventilation abnormalities identified.

Perfusion: No wedge shaped peripheral perfusion defects to suggest
acute pulmonary embolism.
IMPRESSION: 1. No evidence for pulmonary emboli.  Very low probability.
2. COPD.

## 2019-01-09 IMAGING — MR MR MRA HEAD W/O CM
8 series · 39 of 48 positions shown · non-contrast
Comparison: Head CT 07/27/2018

Brain MRI 04/19/2018

CLINICAL DATA: Acute onset somnolence.  Altered mental status.

EXAM:
MRI HEAD WITHOUT CONTRAST
MRA HEAD WITHOUT CONTRAST
TECHNIQUE: Multiplanar, multiecho pulse sequences of the brain and surrounding
structures were obtained without intravenous contrast. Angiographic
images of the head were obtained using MRA technique without
contrast.

[Series 3: DWI · axial · 3.0mm · 0.94mm/px · z∈[-61,+76]mm · 10 of 100 slices shown (1 of 2)]
[im 1/100]
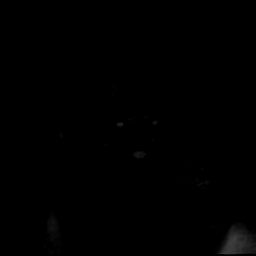
[im 12/100]
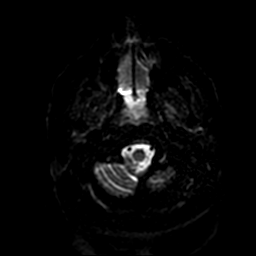
[im 23/100]
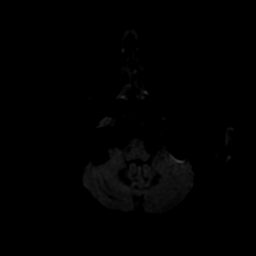
[im 34/100]
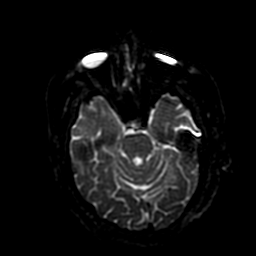
[im 45/100]
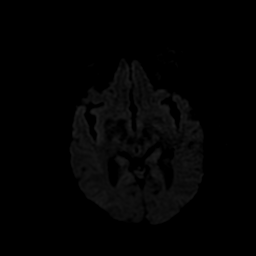
[im 56/100]
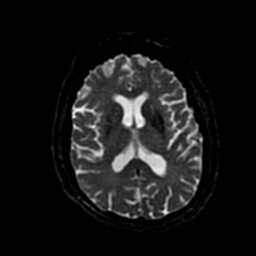
[im 67/100]
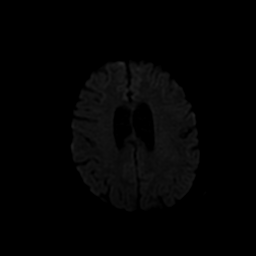
[im 78/100]
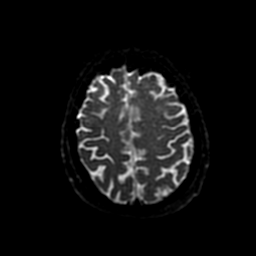
[im 89/100]
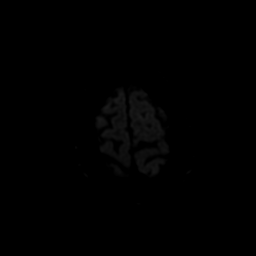
[im 100/100]
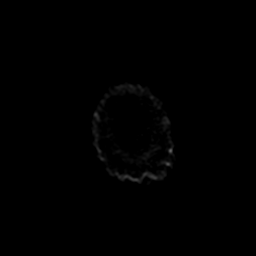

[Series 4: ax (id) 2 · axial · 1.0mm · 0.43mm/px · z∈[-58,+18]mm · 8 of 184 slices shown]
[im 12/184]
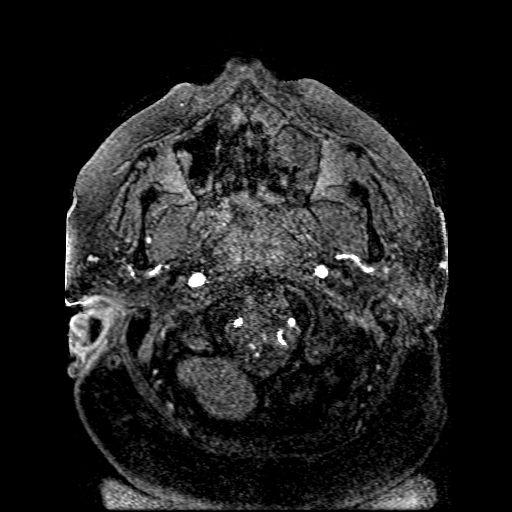
[im 35/184]
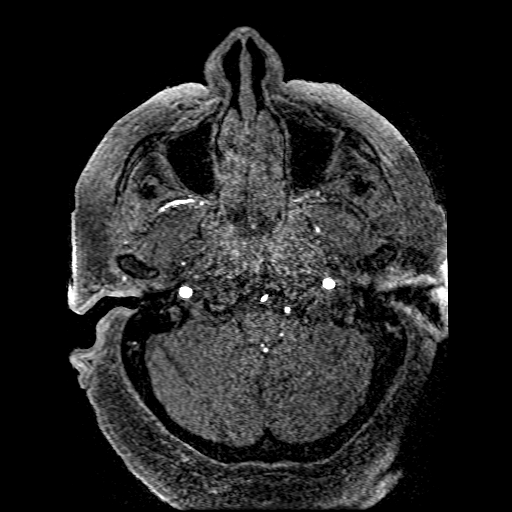
[im 58/184]
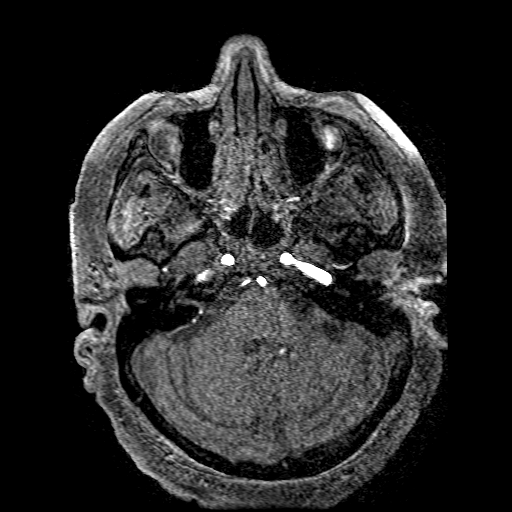
[im 81/184]
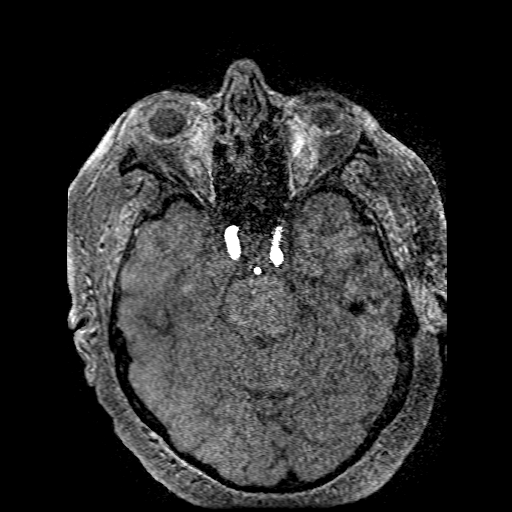
[im 103/184]
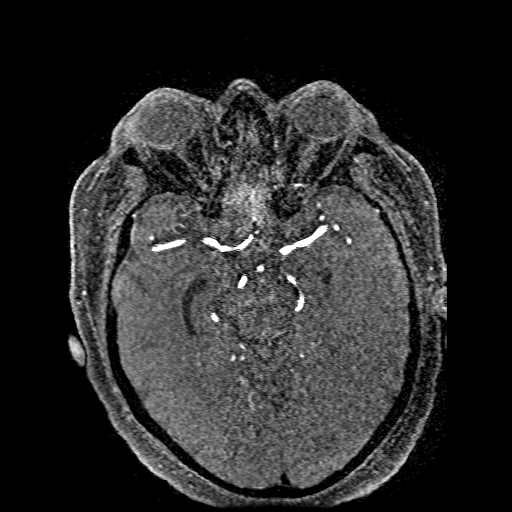
[im 126/184]
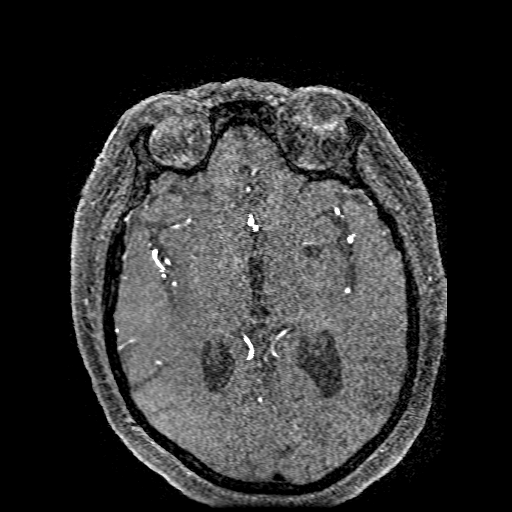
[im 149/184]
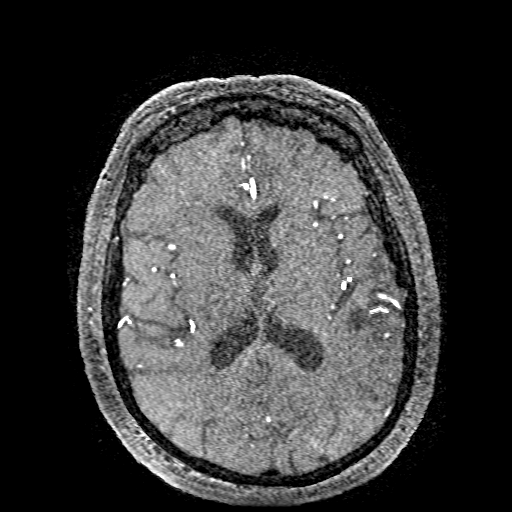
[im 172/184]
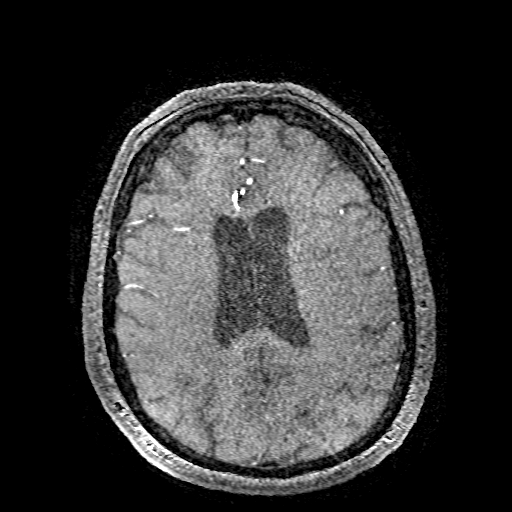

[Series 5: cor focus all · coronal · 3.0mm · 0.70mm/px · 3 of 27 slices shown]
[im 1/27]
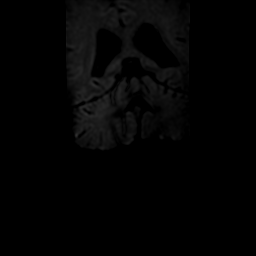
[im 14/27]
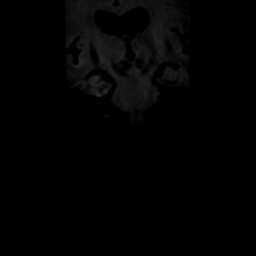
[im 27/27]
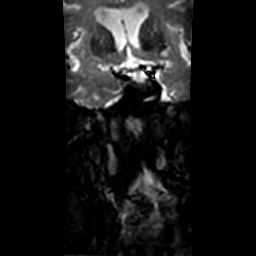

[Series 6: FLAIR · axial · 5.0mm · 0.47mm/px · z∈[-63,+71]mm · 2 of 25 slices shown]
[im 1/25]
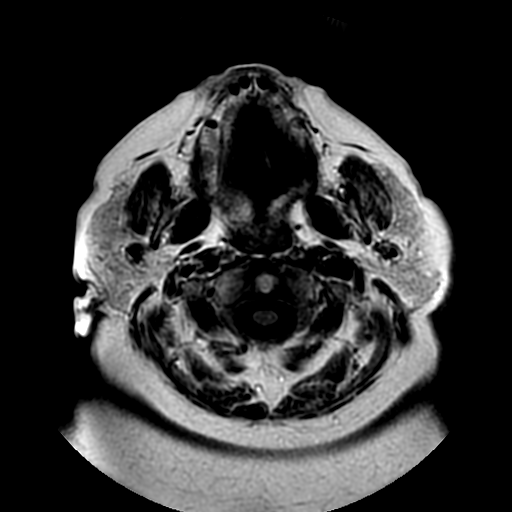
[im 25/25]
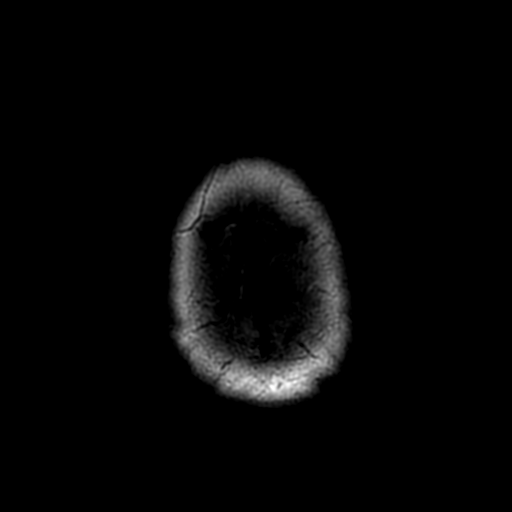

[Series 7: DWI · coronal · 4.0mm · 0.94mm/px · 7 of 72 slices shown (2 of 2)]
[im 1/72]
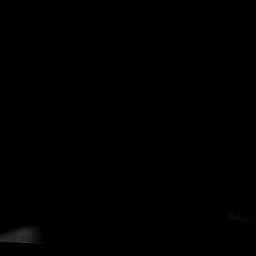
[im 12/72]
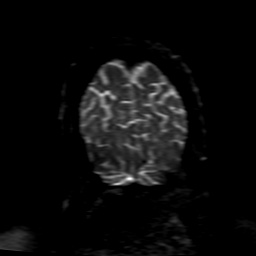
[im 24/72]
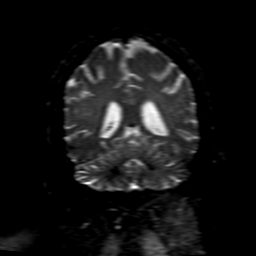
[im 36/72]
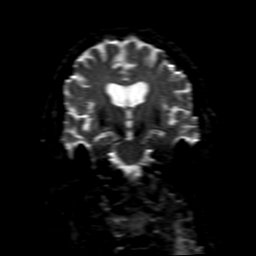
[im 48/72]
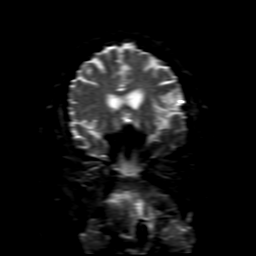
[im 60/72]
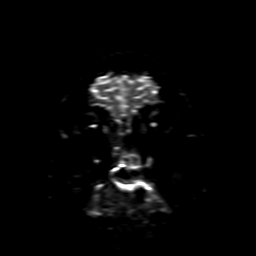
[im 72/72]
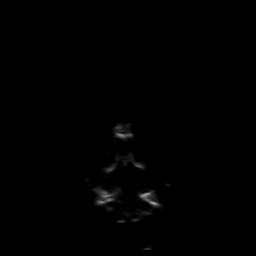

[Series 350: ADC · axial · 3.0mm · 0.94mm/px · z∈[-61,+76]mm · 5 of 50 slices shown (1 of 3)]
[im 1/50]
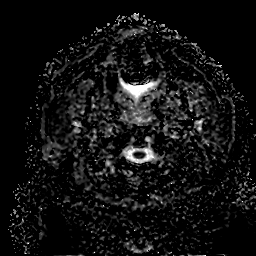
[im 13/50]
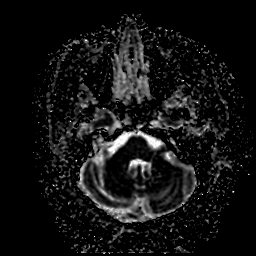
[im 25/50]
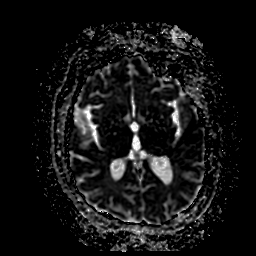
[im 37/50]
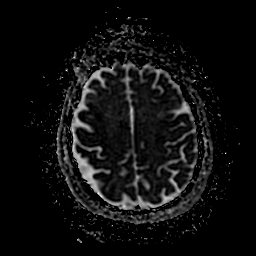
[im 50/50]
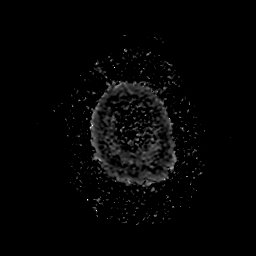

[Series 550: ADC · coronal · 3.0mm · 0.70mm/px · 1 of 13 slices shown (2 of 3)]
[im 1/13]
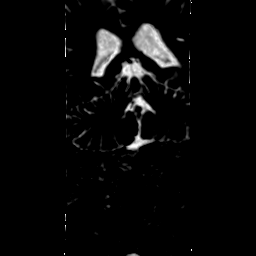

[Series 750: ADC · coronal · 4.0mm · 0.94mm/px · 3 of 36 slices shown (3 of 3)]
[im 1/36]
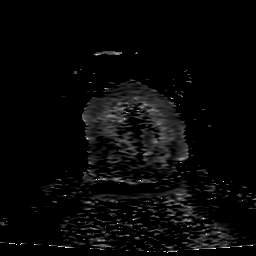
[im 18/36]
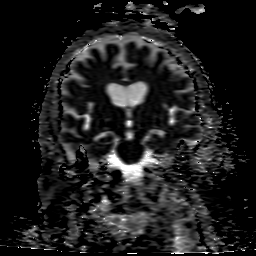
[im 36/36]
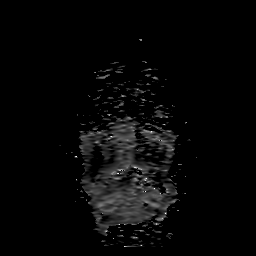

[39 of 48 positions shown; findings below may reference images not displayed]

FINDINGS: An abbreviated protocol was performed. Sagittal T1, axial T2, axial
T1 and coronal T2-weighted imaging was not obtained.

MRI HEAD FINDINGS

BRAIN: There is no acute infarct, acute hemorrhage or mass effect.
The midline structures are normal. There are no old infarcts.
Multifocal white matter hyperintensity, most commonly due to chronic
ischemic microangiopathy. Mild generalized atrophy.

SKULL AND UPPER CERVICAL SPINE: The visualized skull base,
calvarium, upper cervical spine and extracranial soft tissues are
normal.

SINUSES/ORBITS: No fluid levels or advanced mucosal thickening. No
mastoid or middle ear effusion. The orbits are normal.

MRA HEAD FINDINGS

Intracranial internal carotid arteries: Normal.

Anterior cerebral arteries: Normal.

Middle cerebral arteries: Normal.

Posterior communicating arteries: Present on the right only.

Posterior cerebral arteries: Normal.

Basilar artery: Normal.

Vertebral arteries: Left dominant. Normal.

Superior cerebellar arteries: Normal.

Inferior cerebellar arteries: Normal.
IMPRESSION: 1. No acute intracranial abnormality.
2. Findings of chronic microvascular ischemia.
3. Normal intracranial MRA.

## 2019-01-09 IMAGING — CT CT HEAD CODE STROKE
3 series · 15 of 47 positions shown, 18 images · non-contrast
Comparison: Head CT 04/18/2018

CLINICAL DATA: Code stroke.  Altered mental status

EXAM:
CT HEAD WITHOUT CONTRAST
TECHNIQUE: Contiguous axial images were obtained from the base of the skull
through the vertex without intravenous contrast.

[Series 2: head code stroke wo · axial · 0.47mm/px · z∈[+104,+234]mm · 9 of 32 slices shown, 12 images]
[im 3/32  brain]
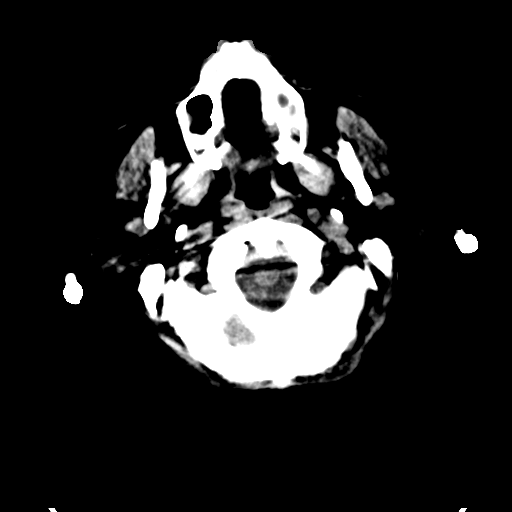
[im 3/32  bone]
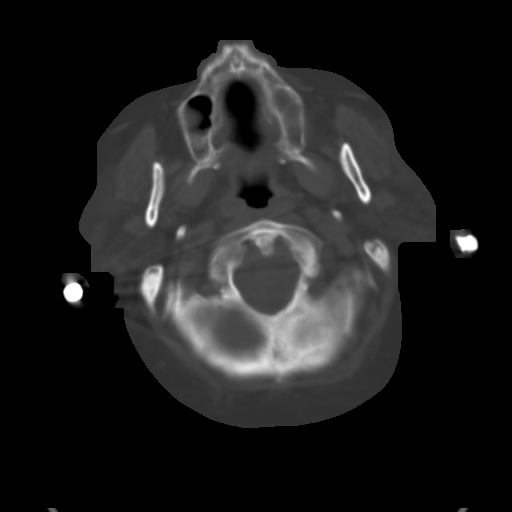
[im 6/32  brain]
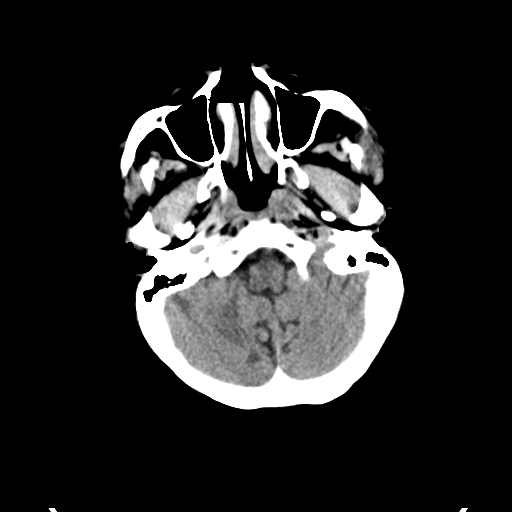
[im 9/32  brain]
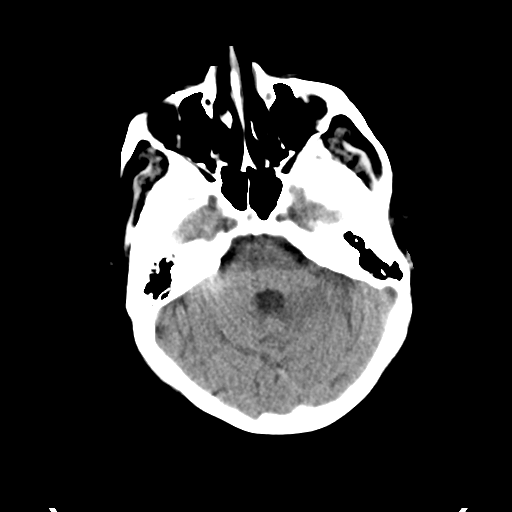
[im 12/32  brain]
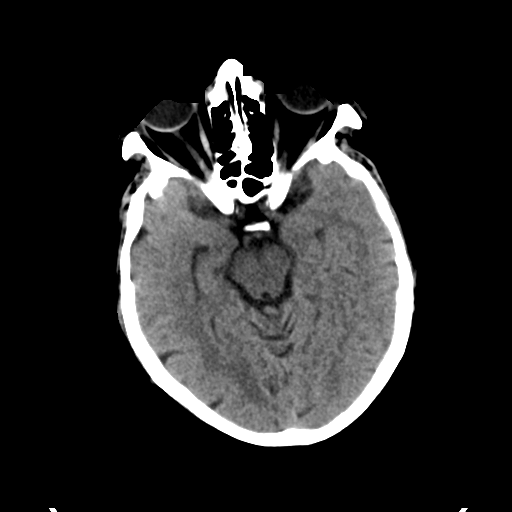
[im 17/32  brain]
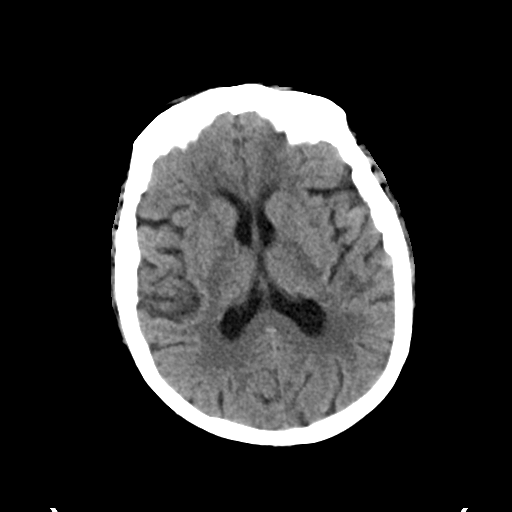
[im 17/32  bone]
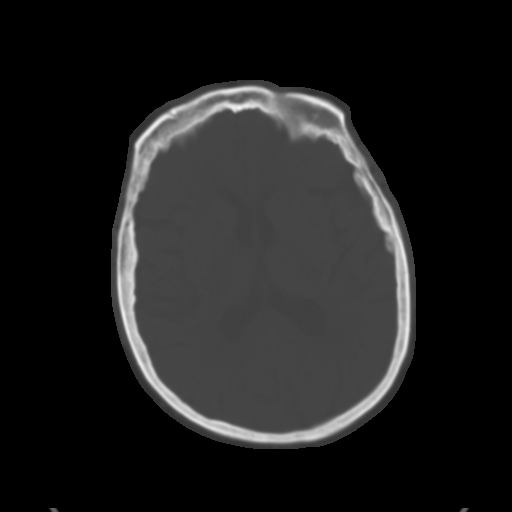
[im 20/32  brain]
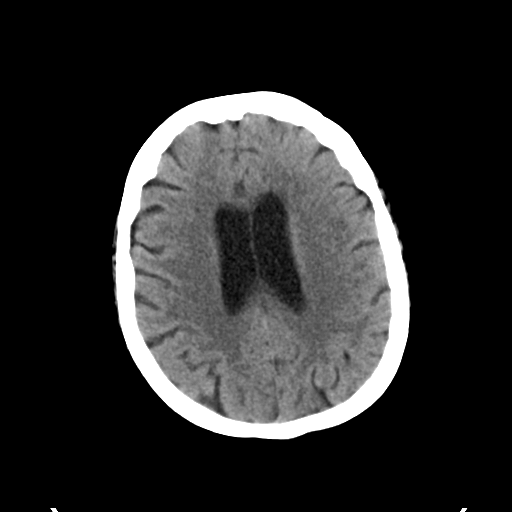
[im 23/32  brain]
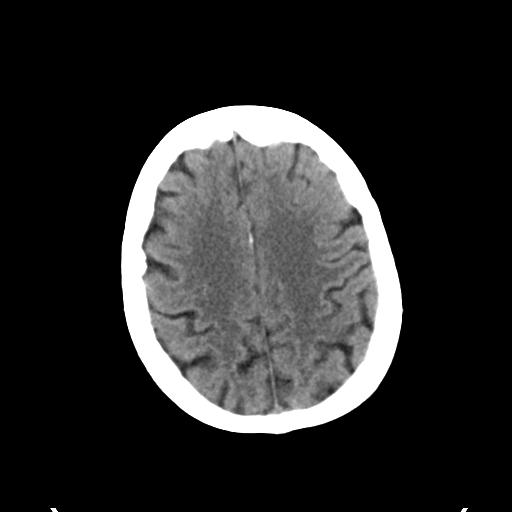
[im 26/32  brain]
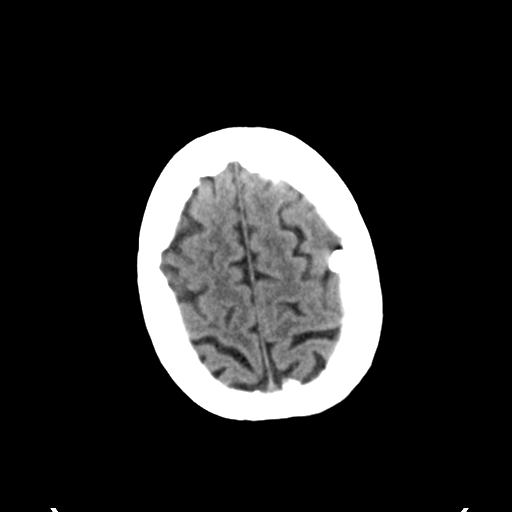
[im 29/32  brain]
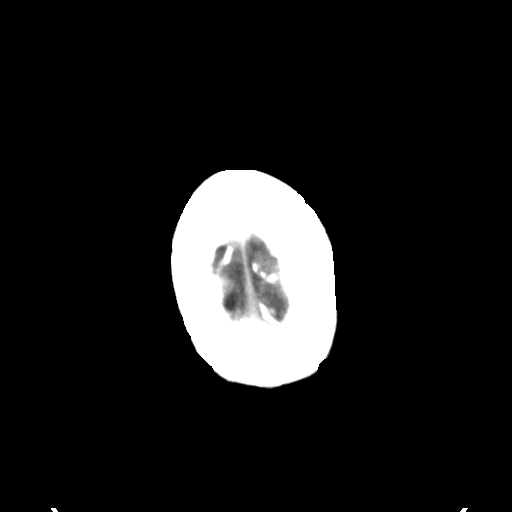
[im 29/32  bone]
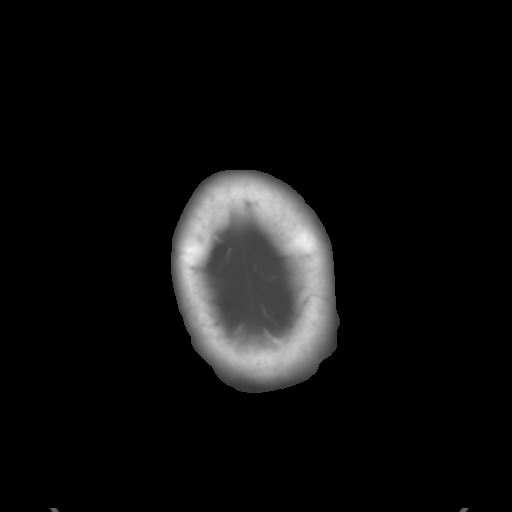

[Series 4: coronal soft tissue · coronal · 0.32mm/px · 3 of 67 slices shown]
[im 23/67  brain]
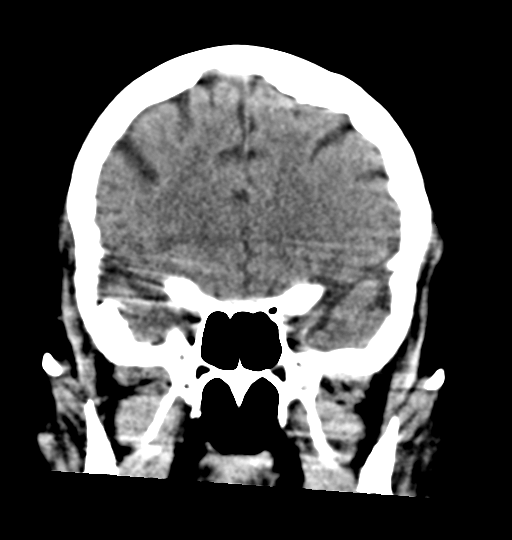
[im 30/67  brain]
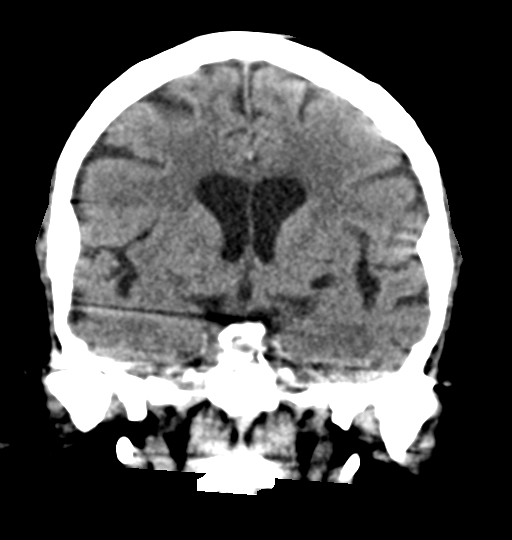
[im 37/67  brain]
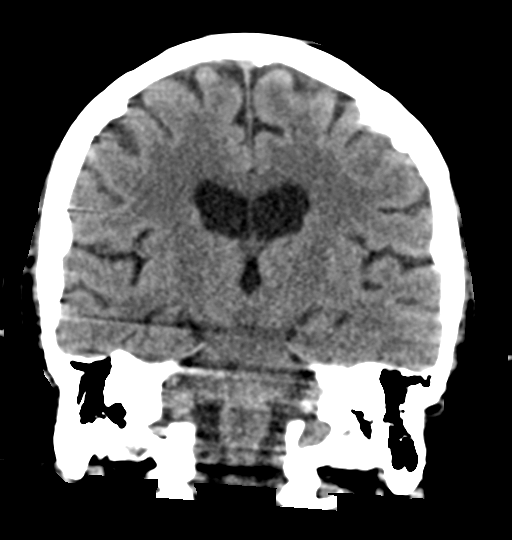

[Series 5: sagittal soft tissue · sagittal · 0.34mm/px · 3 of 53 slices shown]
[im 18/53  brain]
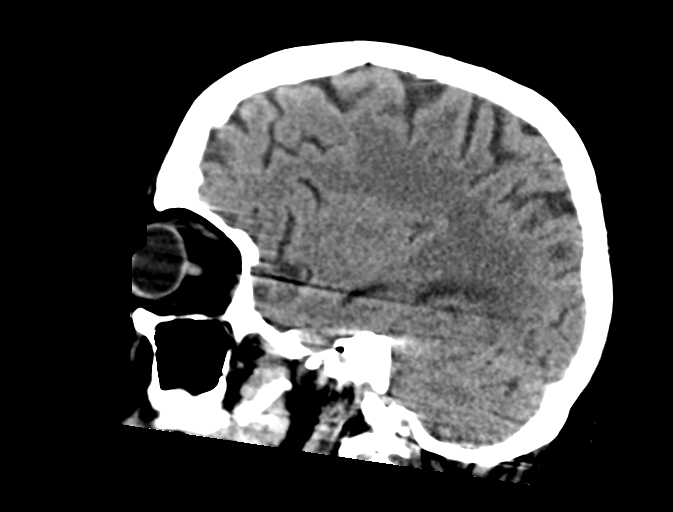
[im 27/53  brain]
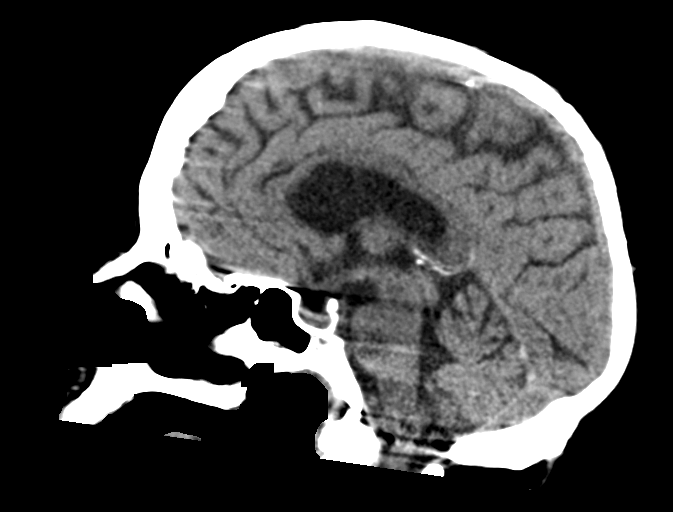
[im 35/53  brain]
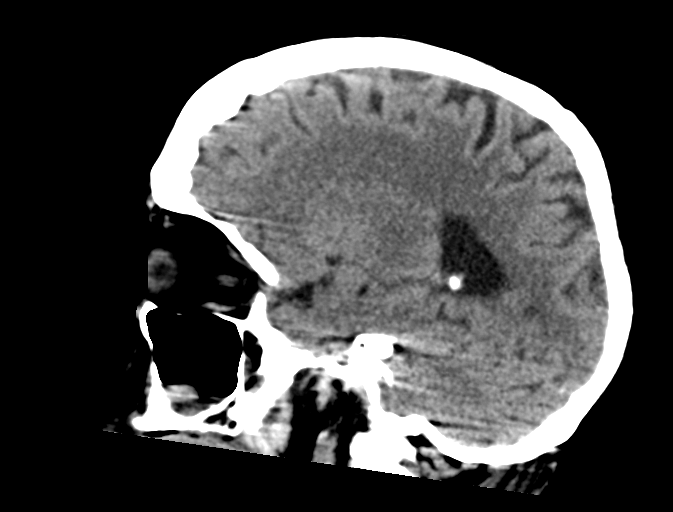

[15 of 47 positions shown; findings below may reference images not displayed]

FINDINGS: Brain: There is no mass, hemorrhage or extra-axial collection. The
size and configuration of the ventricles and extra-axial CSF spaces
are normal. There is a prominent perivascular space of the left
lentiform nucleus, unchanged. Mild white matter hypoattenuation
compatible with chronic small vessel disease.

Vascular: No abnormal hyperdensity of the major intracranial
arteries or dural venous sinuses. No intracranial atherosclerosis.

Skull: The visualized skull base, calvarium and extracranial soft
tissues are normal.

Sinuses/Orbits: No fluid levels or advanced mucosal thickening of
the visualized paranasal sinuses. No mastoid or middle ear effusion.
The orbits are normal.

ASPECTS (Alberta Stroke Program Early CT Score)

- Ganglionic level infarction (caudate, lentiform nuclei, internal
capsule, insula, M1-M3 cortex): 7

- Supraganglionic infarction (M4-M6 cortex): 3

Total score (0-10 with 10 being normal): 10
IMPRESSION: 1. No hemorrhage or other acute abnormality.
2. ASPECTS is 10.

These results were called by telephone at the time of interpretation
on 07/27/2018 at [DATE] to Dr. PROMAD FENIL , who verbally
acknowledged these results.

## 2019-01-09 IMAGING — CR DG CHEST 1V PORT
1 series · 1 of 1 positions shown · non-contrast
Comparison: 07/14/2018.

CLINICAL DATA: Tube placement.

EXAM:
PORTABLE CHEST 1 VIEW

[portable]
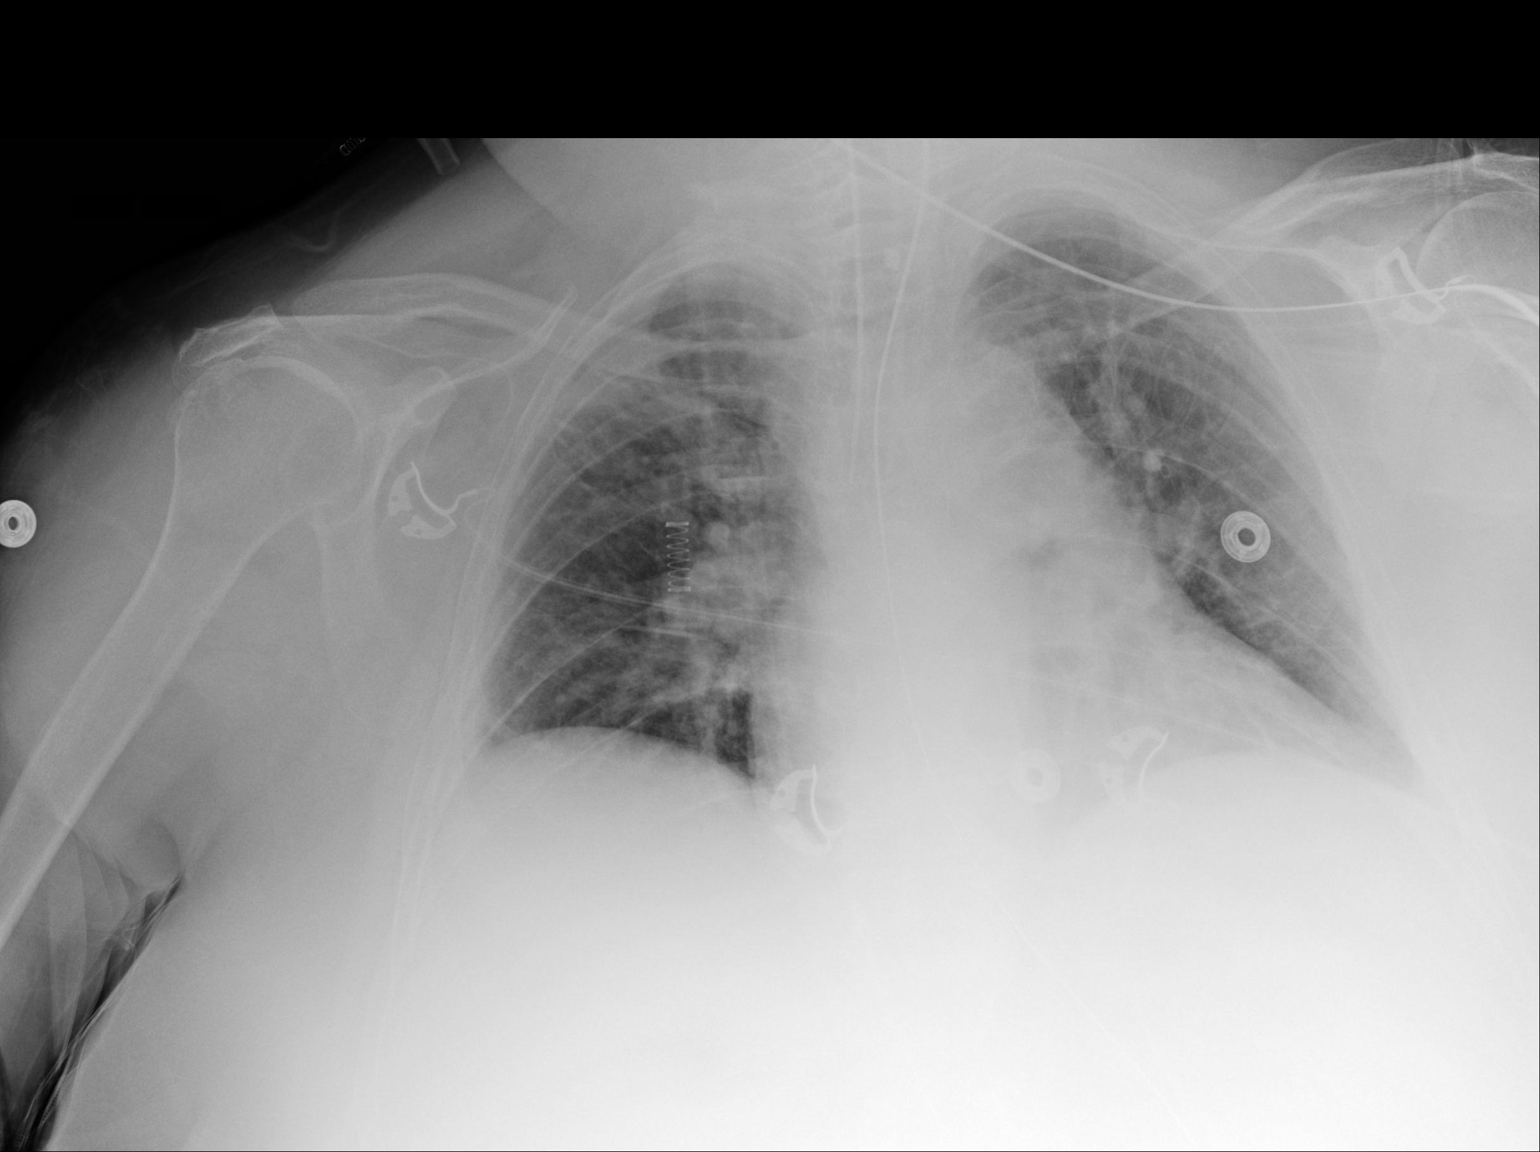

[1 of 1 positions shown; findings below may reference images not displayed]

FINDINGS: Endotracheal tube tip noted approximately 7 mm above the lower
portion of the carina. Proximal repositioning of approximately 2 cm
should be considered. NG tube noted with tip below left
hemidiaphragm. Cardiomegaly with pulmonary venous congestion
bilateral interstitial prominence. Findings consistent with CHF. No
pneumothorax.
IMPRESSION: 1. Endotracheal tube noted with its tip approximately 7 mm above the
lower portion of the carina. Proximal repositioning of approximately
2 cm should be considered. NG tube noted with tip below left
hemidiaphragm.

2. Cardiomegaly with pulmonary venous congestion bilateral
interstitial prominence consistent with CHF.

## 2019-01-10 IMAGING — DX DG CHEST 1V PORT
1 series · 1 of 1 positions shown · non-contrast
Comparison: July 27, 2018

CLINICAL DATA: Hypoxia

EXAM:
PORTABLE CHEST 1 VIEW

[chest]
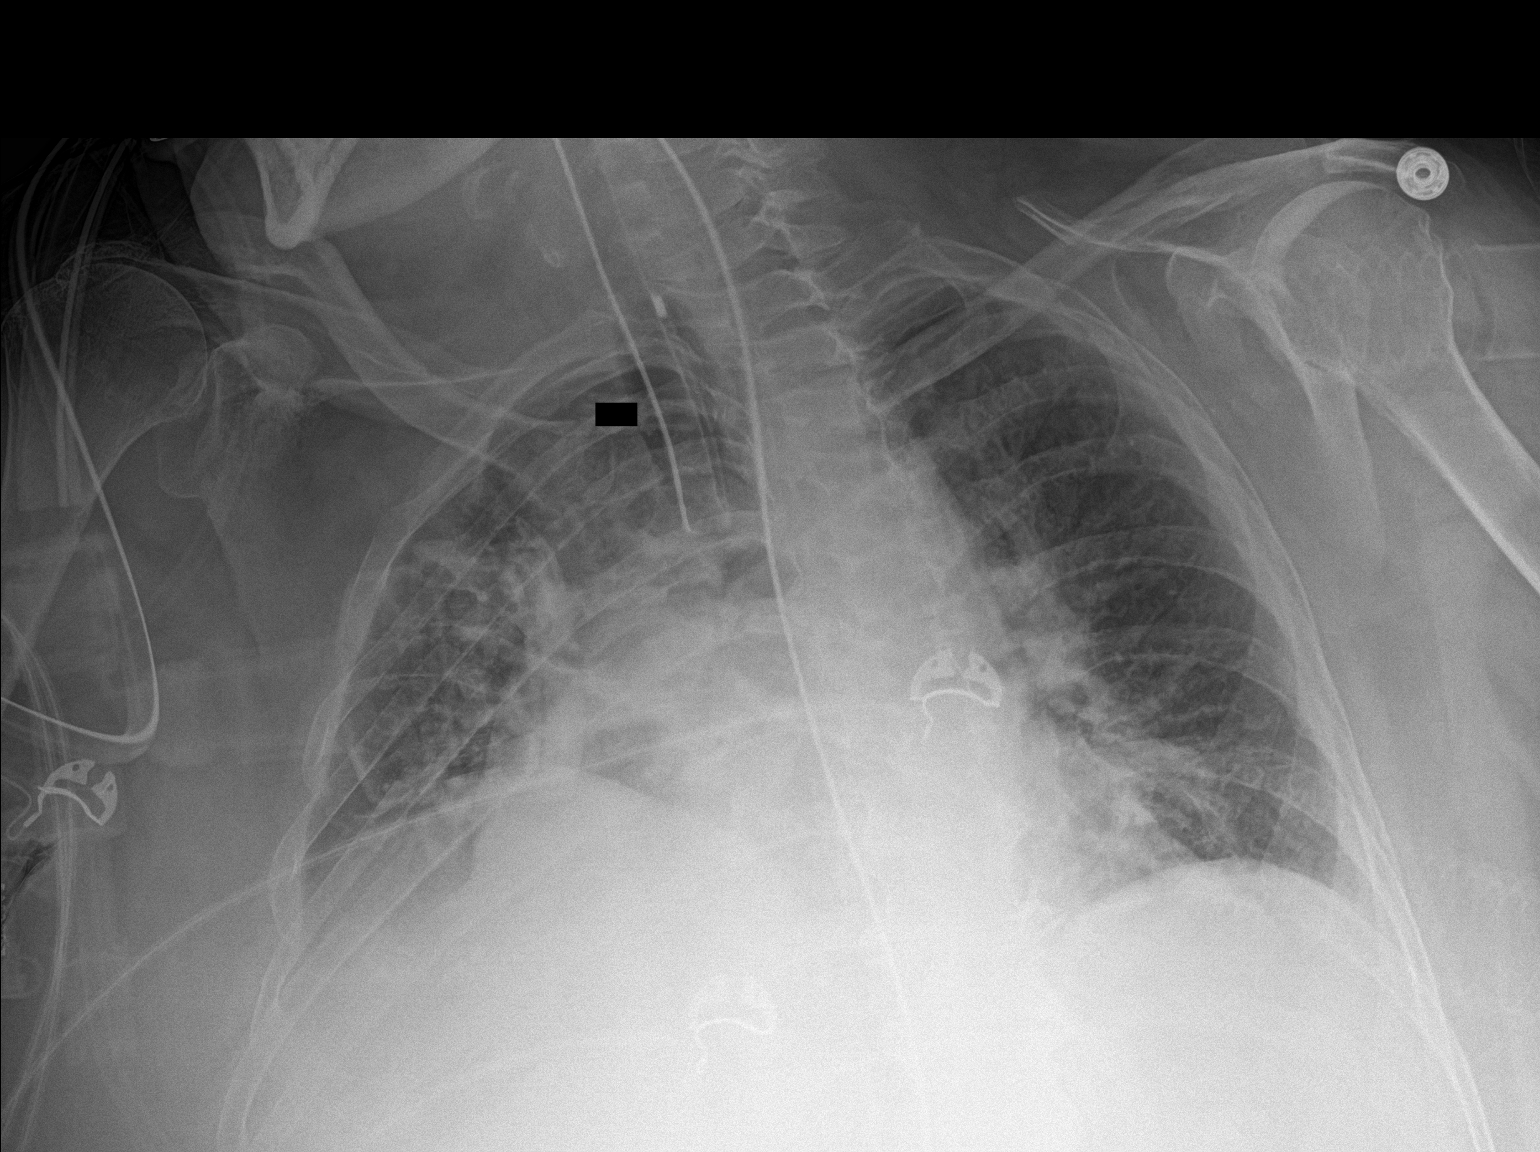

[1 of 1 positions shown; findings below may reference images not displayed]

FINDINGS: Endotracheal tube tip is 2.2 cm above the carina. Nasogastric tube
tip and side port are below the diaphragm. No pneumothorax. There is
atelectatic change in the lung bases. The lungs elsewhere are clear.
Heart is mildly enlarged with pulmonary vascularity normal. No
adenopathy. No appreciable bone lesions.
IMPRESSION: Tube positions as described without pneumothorax. Bibasilar
atelectasis. No frank airspace consolidation. Stable cardiac
prominence.

## 2019-01-28 IMAGING — CR DG CHEST 1V PORT
1 series · 1 of 1 positions shown · non-contrast
Comparison: [HOSPITAL] [facility] 08/11/2018 and earlier.

CLINICAL DATA: 67-year-old female with chest pain and weakness.

EXAM:
PORTABLE CHEST 1 VIEW

[portable]
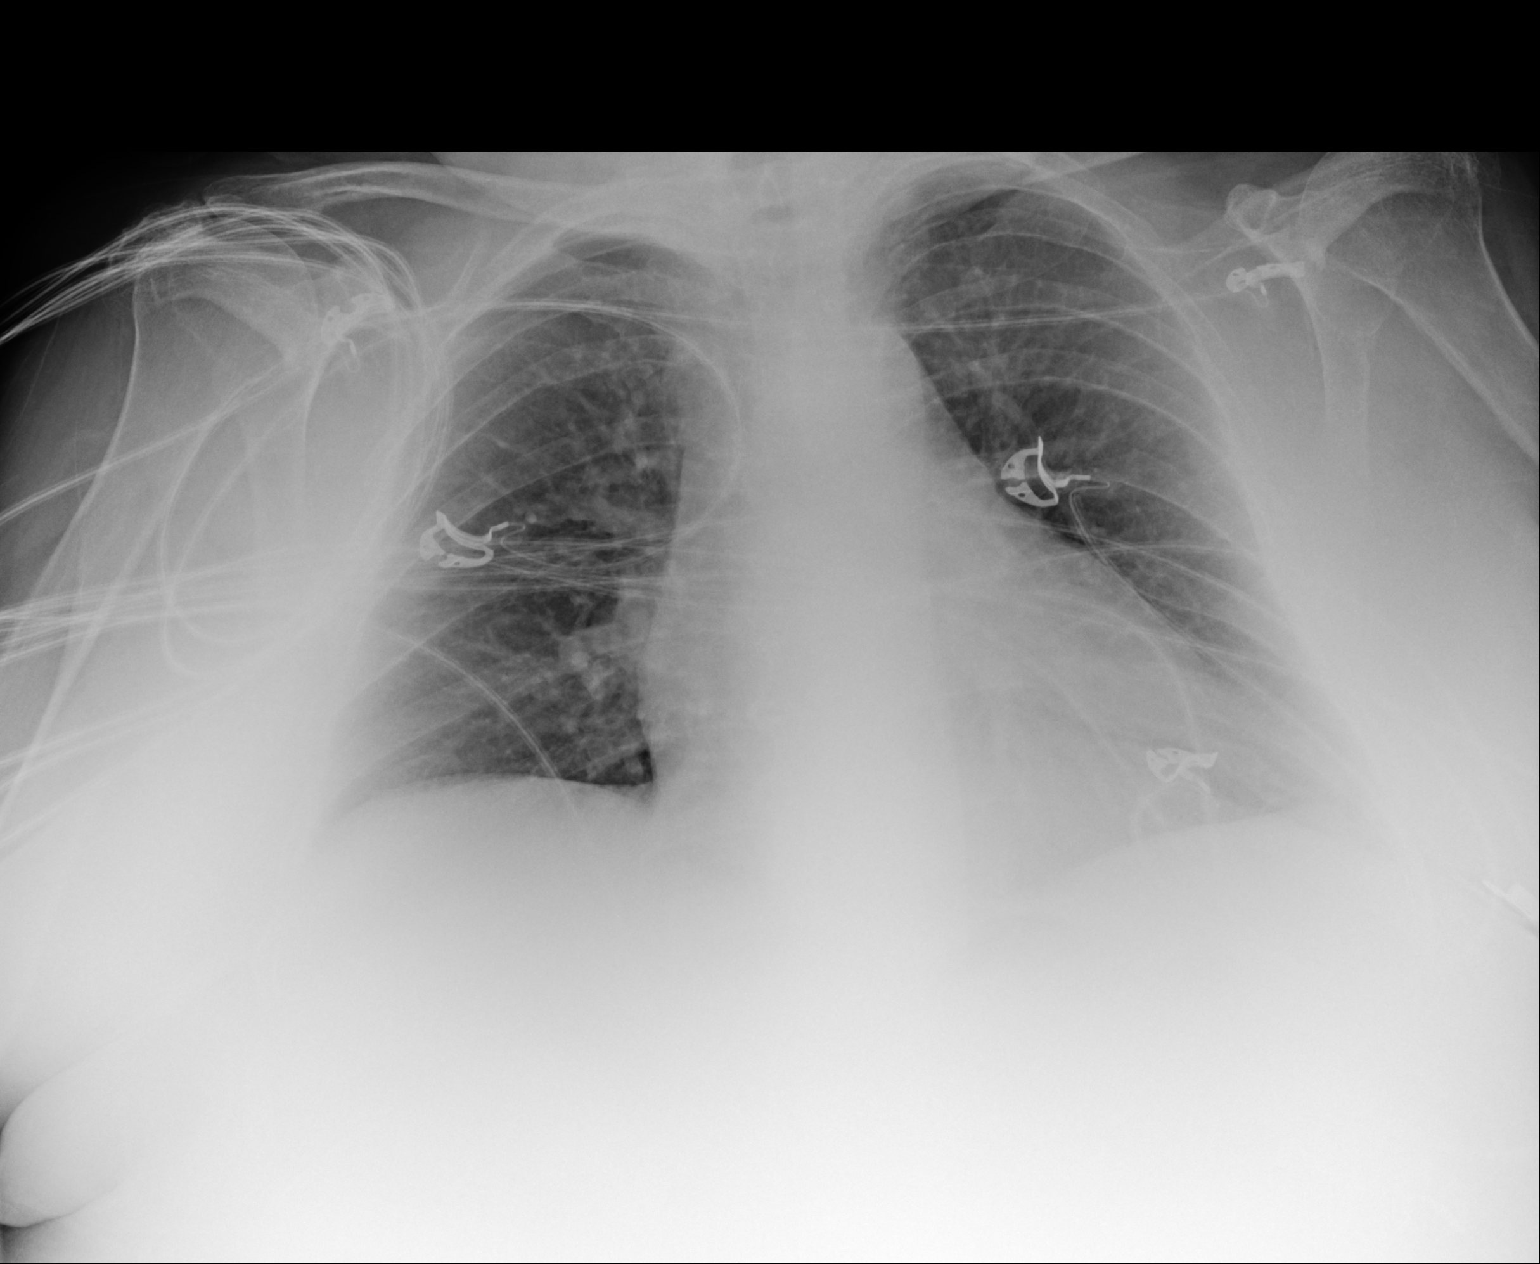

[1 of 1 positions shown; findings below may reference images not displayed]

FINDINGS: Portable AP upright view at 4304 hours. Low but improved lung
volumes. Normal cardiac size and mediastinal contours. Visualized
tracheal air column is within normal limits. Allowing for portable
technique the lungs are clear. No pneumothorax. Paucity of bowel gas
in the upper abdomen. Visible osseous structures appear intact.
IMPRESSION: No acute cardiopulmonary abnormality.

## 2019-01-28 IMAGING — US US EXTREM LOW VENOUS BILAT
1 series · 13 of 24 positions shown · non-contrast
Comparison: None.

CLINICAL DATA: Bilateral lower extremity pain and edema since this
morning. Evaluate for DVT.



[Series 1: us extrem low venous bilat · 13 of 64 slices shown]
[im 1/64]
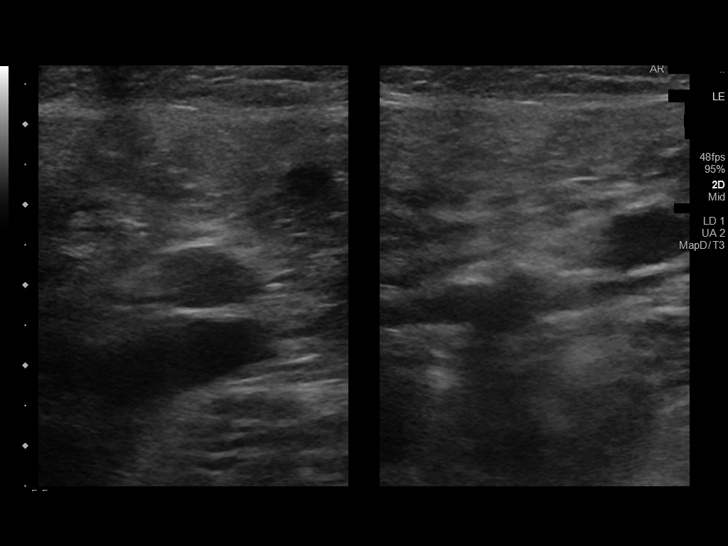
[im 6/64]
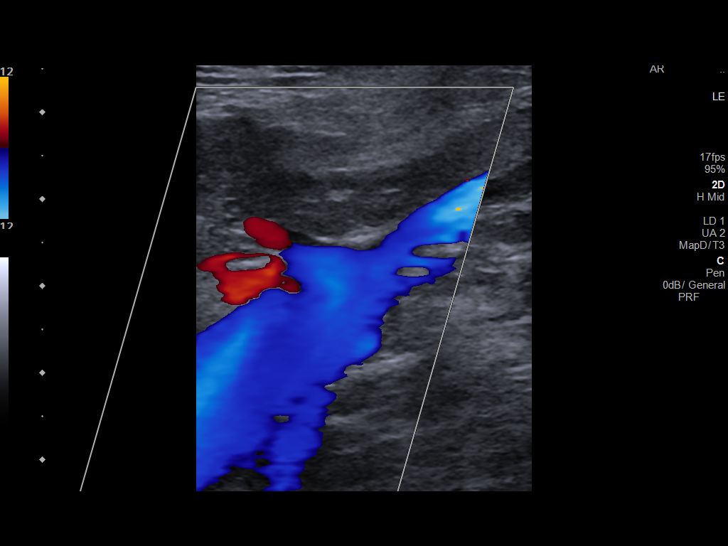
[im 11/64]
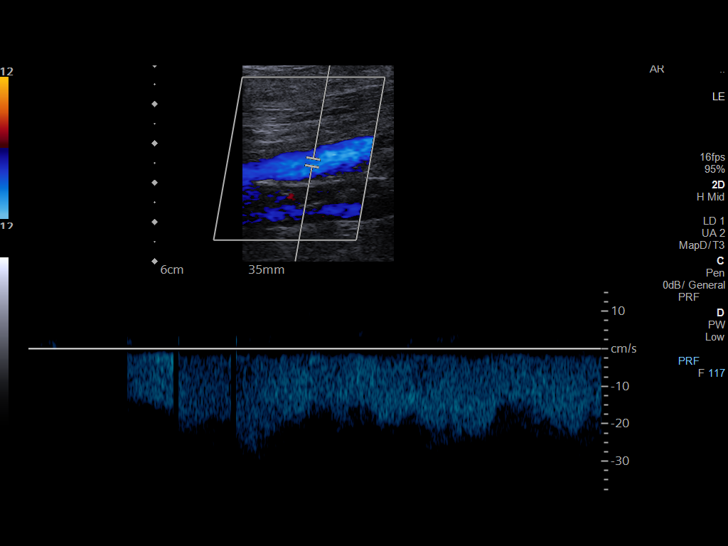
[im 17/64]
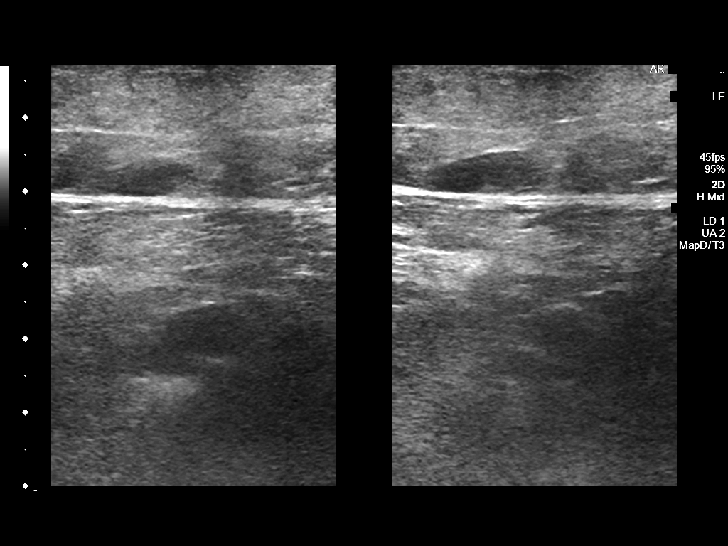
[im 22/64]
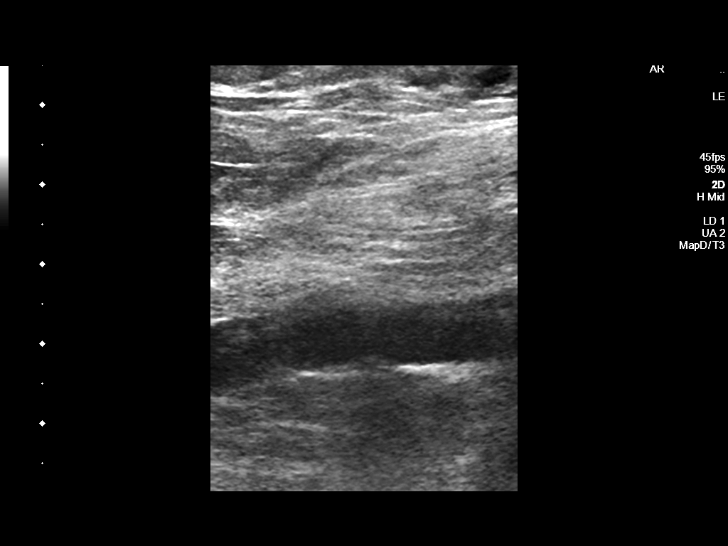
[im 28/64]
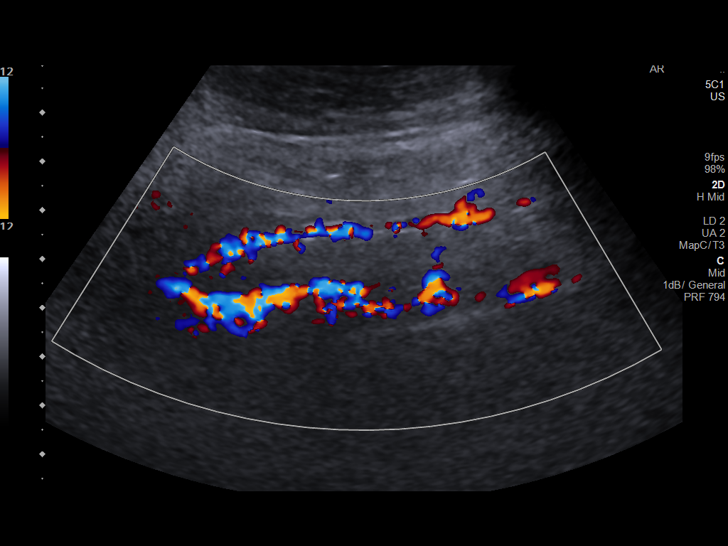
[im 33/64]
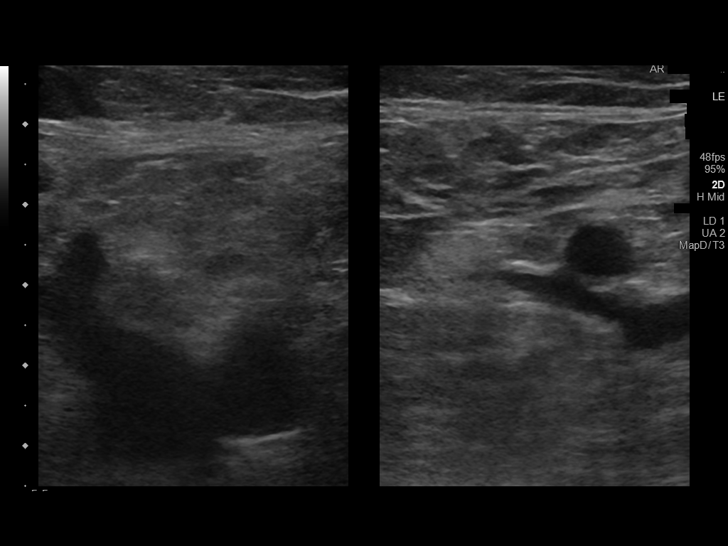
[im 36/64]
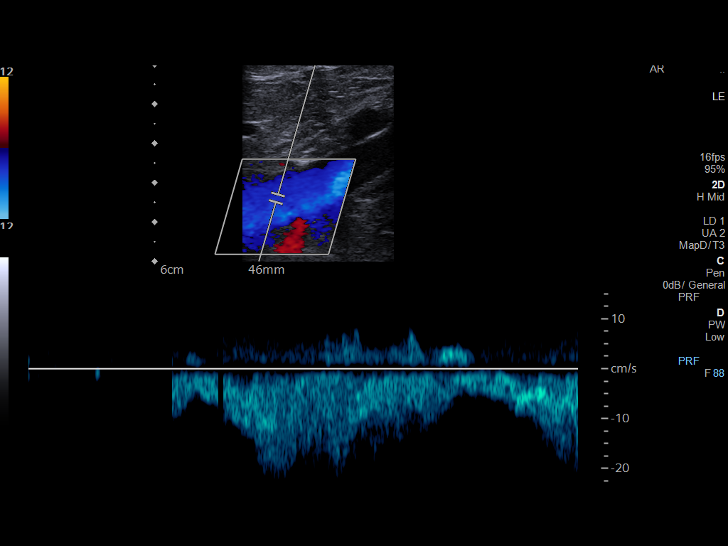
[im 42/64]
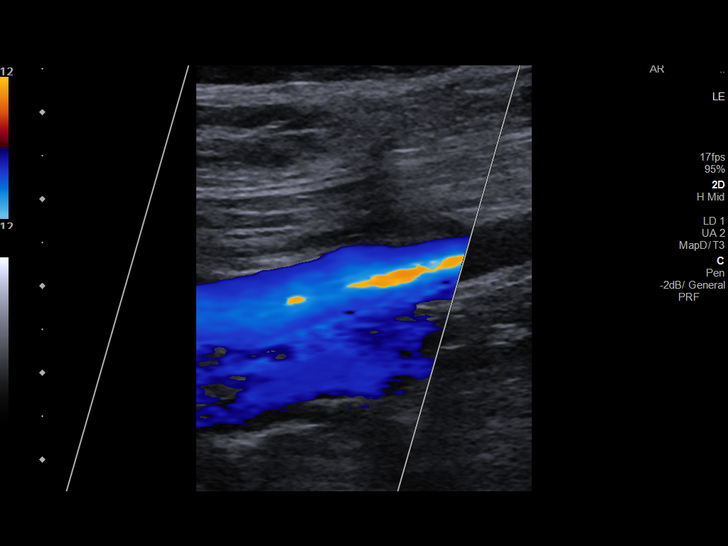
[im 47/64]
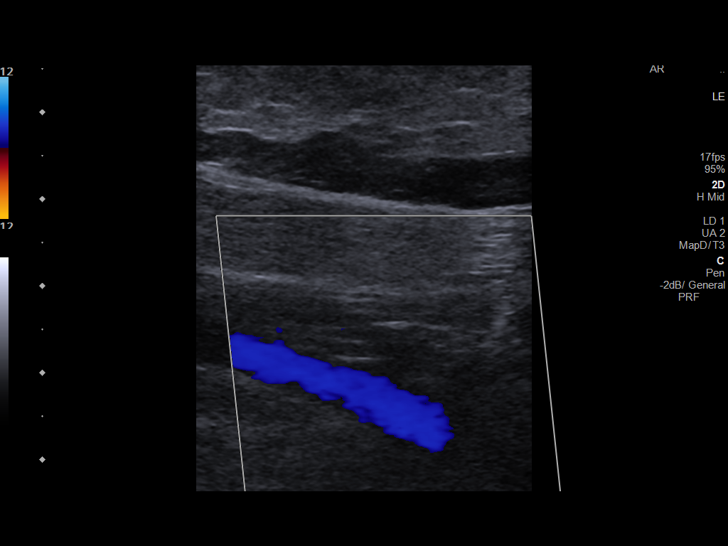
[im 53/64]
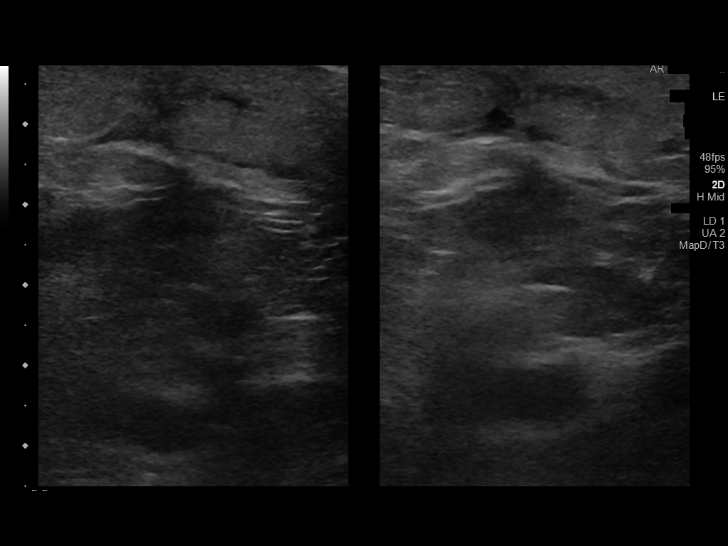
[im 58/64]
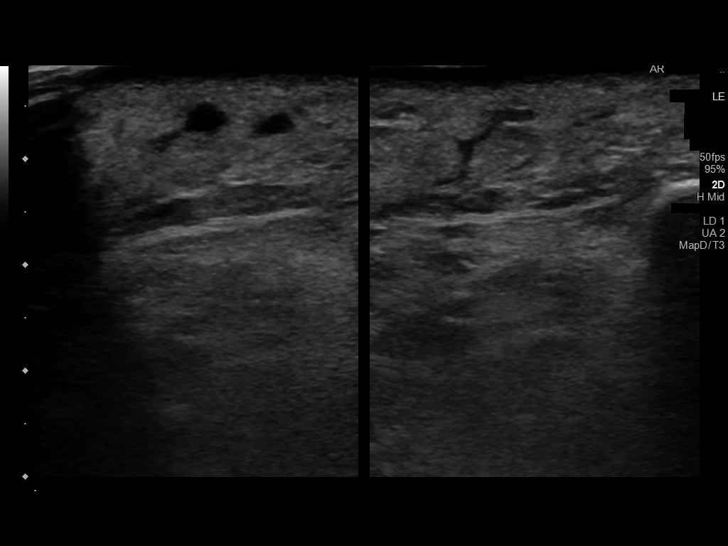
[im 64/64]
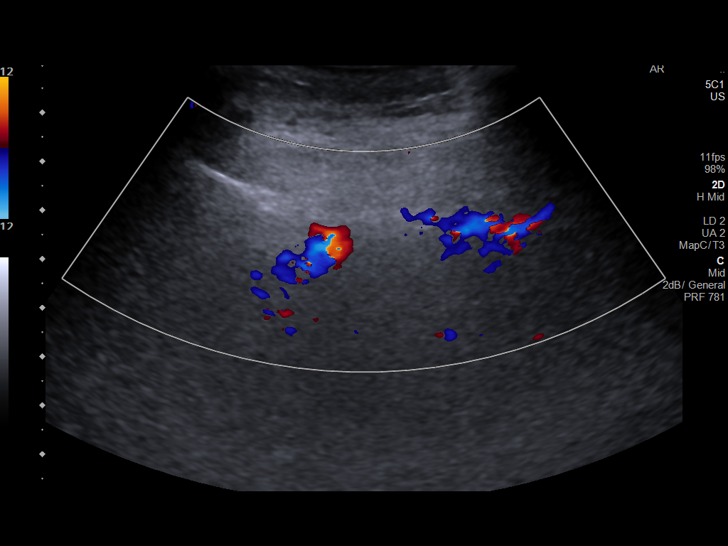

[13 of 24 positions shown; findings below may reference images not displayed]

FINDINGS: RIGHT LOWER EXTREMITY

Common Femoral Vein: No evidence of thrombus. Normal
compressibility, respiratory phasicity and response to augmentation.

Saphenofemoral Junction: No evidence of thrombus. Normal
compressibility and flow on color Doppler imaging.

Profunda Femoral Vein: No evidence of thrombus. Normal
compressibility and flow on color Doppler imaging.

Femoral Vein: No evidence of thrombus. Normal compressibility,
respiratory phasicity and response to augmentation.

Popliteal Vein: No evidence of thrombus. Normal compressibility,
respiratory phasicity and response to augmentation.

Calf Veins: No evidence of thrombus. Normal compressibility and flow
on color Doppler imaging.

Superficial Great Saphenous Vein: No evidence of thrombus. Normal
compressibility.

Venous Reflux:  None.

Other Findings:  None.

LEFT LOWER EXTREMITY

Common Femoral Vein: No evidence of thrombus. Normal
compressibility, respiratory phasicity and response to augmentation.

Saphenofemoral Junction: No evidence of thrombus. Normal
compressibility and flow on color Doppler imaging.

Profunda Femoral Vein: No evidence of thrombus. Normal
compressibility and flow on color Doppler imaging.

Femoral Vein: No evidence of thrombus. Normal compressibility,
respiratory phasicity and response to augmentation.

Popliteal Vein: No evidence of thrombus. Normal compressibility,
respiratory phasicity and response to augmentation.

Calf Veins: No evidence of thrombus. Normal compressibility and flow
on color Doppler imaging.

Superficial Great Saphenous Vein: No evidence of thrombus. Normal
compressibility.

Venous Reflux:  None.

Other Findings:  None.
IMPRESSION: No evidence of DVT within either lower extremity.

## 2019-01-29 IMAGING — DX DG CHEST 1V PORT
1 series · 1 of 1 positions shown · non-contrast
Comparison: 08/16/2018 at [DATE] a.m.

CLINICAL DATA: Intubation

EXAM:
PORTABLE CHEST 1 VIEW

[chest]
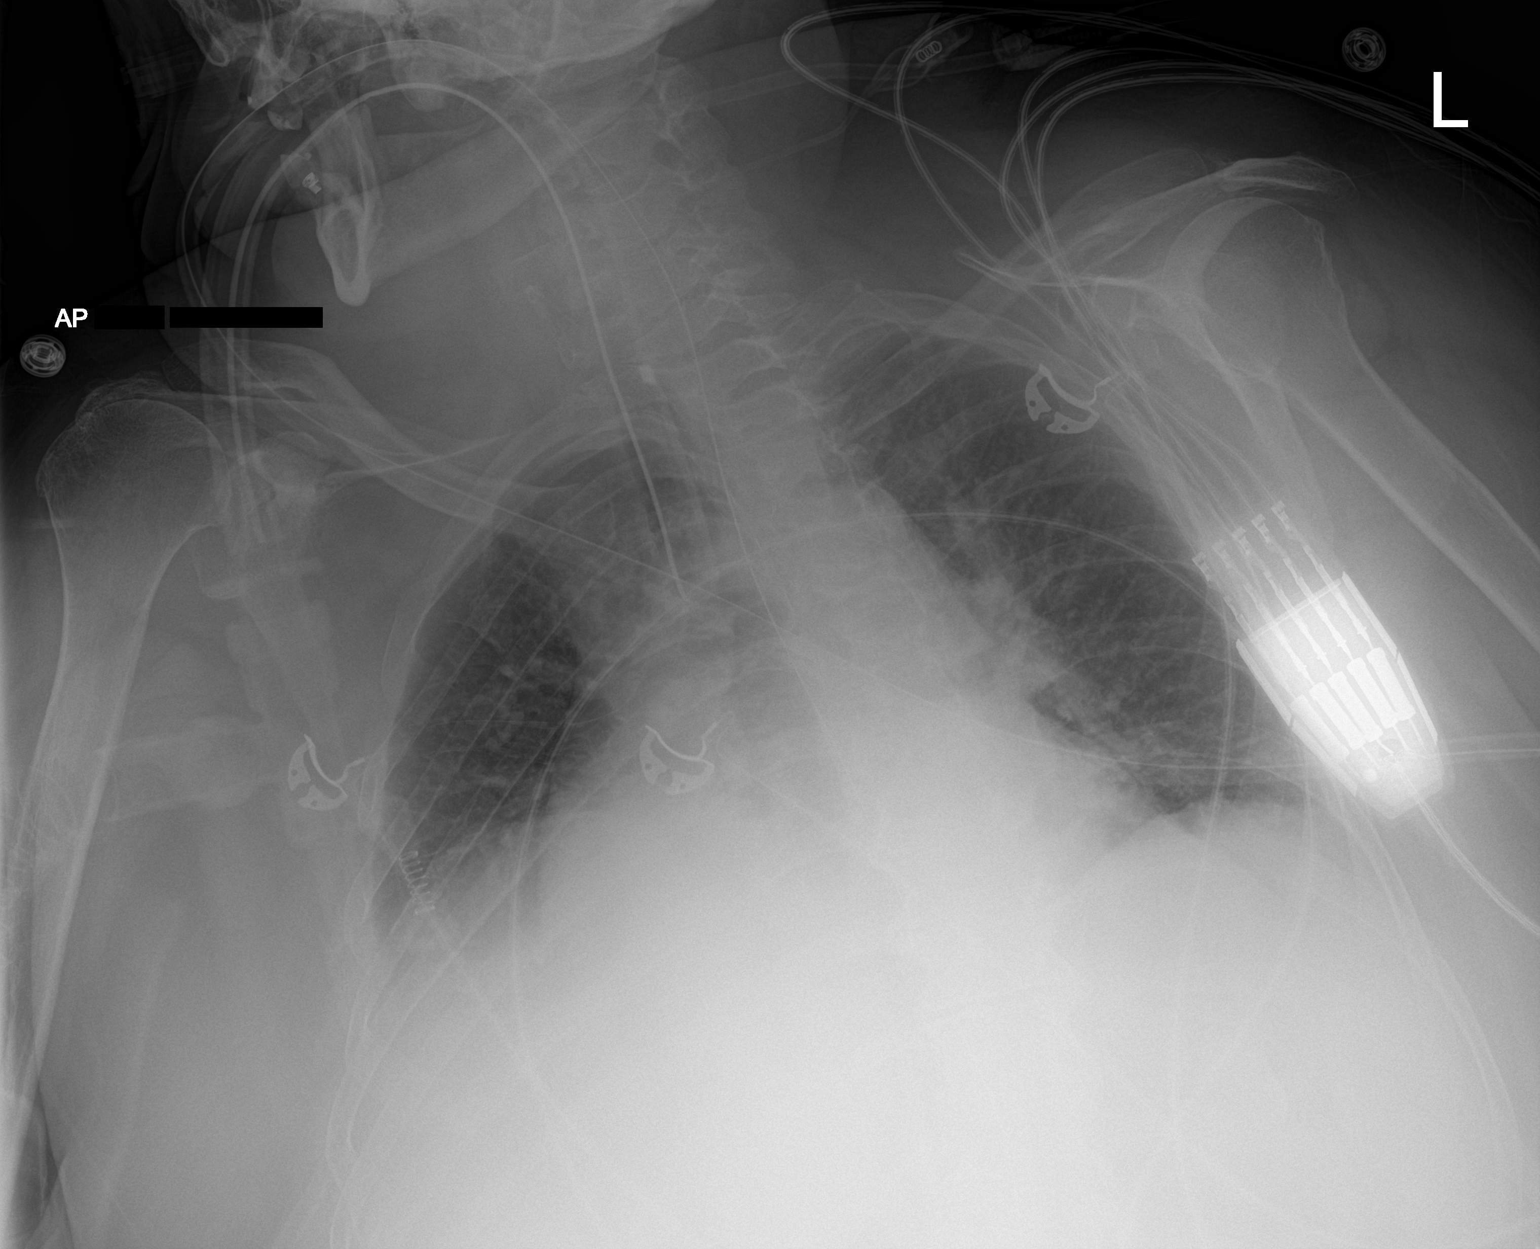

[1 of 1 positions shown; findings below may reference images not displayed]

FINDINGS: Endotracheal tube tip is just below the level of the clavicles,
approximately 1.3 cm above the inferior margin of the carina. The
enteric tube tip is beyond the field of view. The side port is not
clearly visualized. There is shallow lung inflation with bibasilar
atelectasis. Unchanged cardiomegaly.
IMPRESSION: Endotracheal tube tip just below the level of the clavicles,
approximately 1.3 cm above the inferior margin of the carina.
Enteric tube tip beyond the field of view.

## 2019-01-29 IMAGING — CR DG CHEST 1V PORT
1 series · 1 of 1 positions shown · non-contrast
Comparison: Chest radiograph performed 08/15/2018

CLINICAL DATA: Acute onset of altered mental status. Endotracheal
tube placement.

EXAM:
PORTABLE CHEST 1 VIEW

[ap]
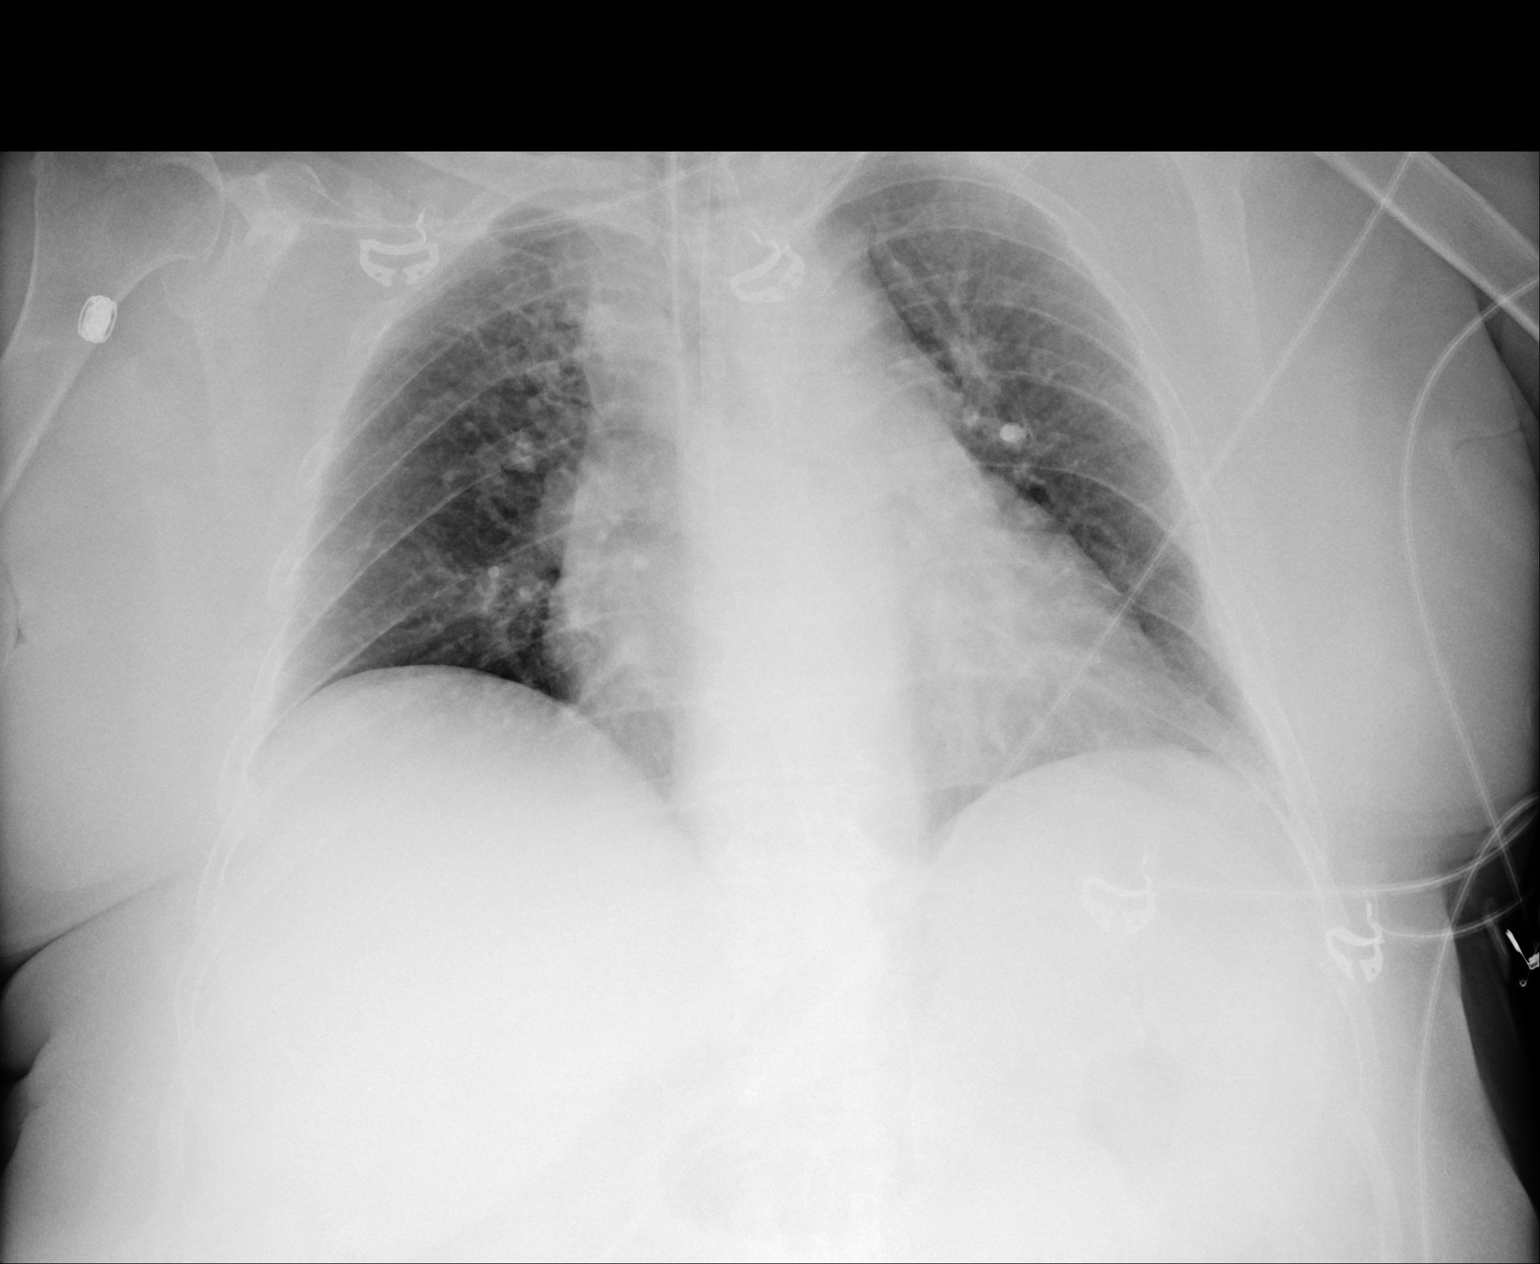

[1 of 1 positions shown; findings below may reference images not displayed]

FINDINGS: The patient's endotracheal tube is seen ending 1.5 cm above the
carina. This could be retracted 2 cm.

The patient's enteric tube is seen ending overlying the body of the
stomach, with the side port about the gastroesophageal junction.
This could be advanced 3 cm.

The lungs are mildly hypoexpanded. Vascular congestion and vascular
crowding are noted. No pleural effusion or pneumothorax is seen

The cardiomediastinal silhouette is mildly enlarged. No acute
osseous abnormalities are identified.
IMPRESSION: 1. Endotracheal tube seen ending 1.5 cm above the carina. This could
be retracted 2 cm.
2. Enteric tube noted ending overlying the body of the stomach, with
the side port about the gastroesophageal junction. This could be
advanced 3 cm.
3. Lungs mildly hypoexpanded but grossly clear. Vascular congestion
and mild cardiomegaly noted.

## 2019-01-29 IMAGING — CT CT HEAD W/O CM
3 series · 15 of 47 positions shown, 18 images · non-contrast
Comparison: CT of the head performed 08/11/2018

CLINICAL DATA: Acute onset of altered mental status. Patient
unresponsive.

EXAM:
CT HEAD WITHOUT CONTRAST
TECHNIQUE: Contiguous axial images were obtained from the base of the skull
through the vertex without intravenous contrast.

[Series 2: head trauma wo · axial · 0.45mm/px · z∈[+1566,+1691]mm · 9 of 31 slices shown, 12 images]
[im 3/31  brain]
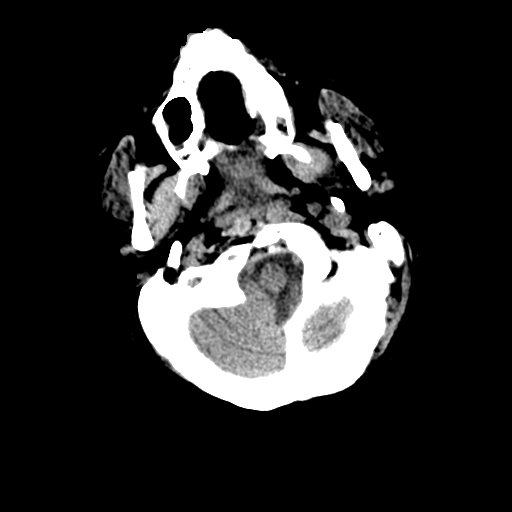
[im 3/31  bone]
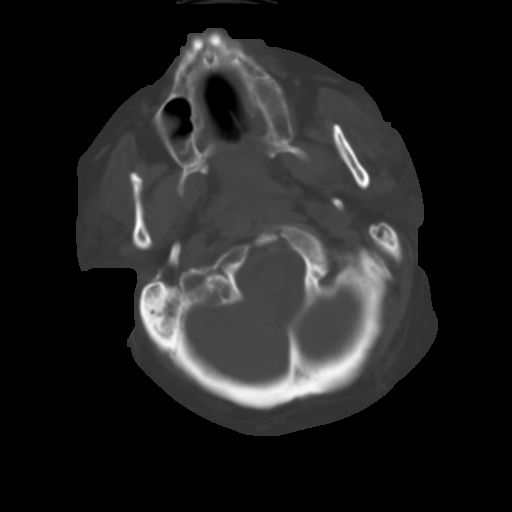
[im 6/31  brain]
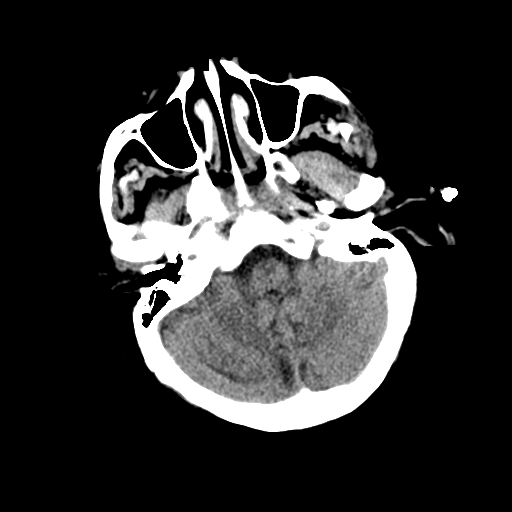
[im 9/31  brain]
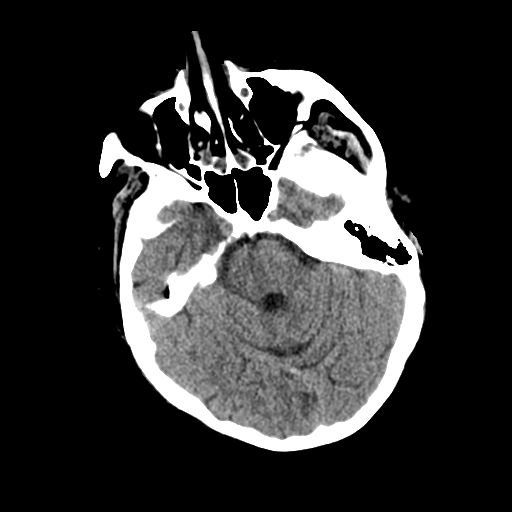
[im 12/31  brain]
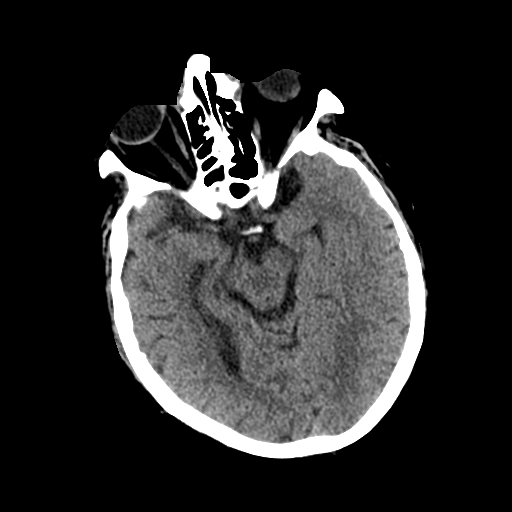
[im 16/31  brain]
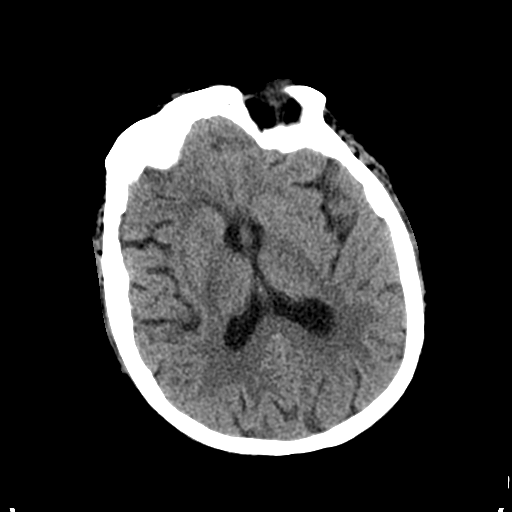
[im 16/31  bone]
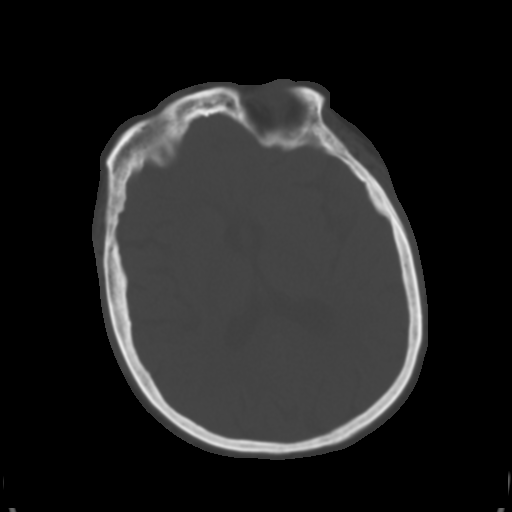
[im 19/31  brain]
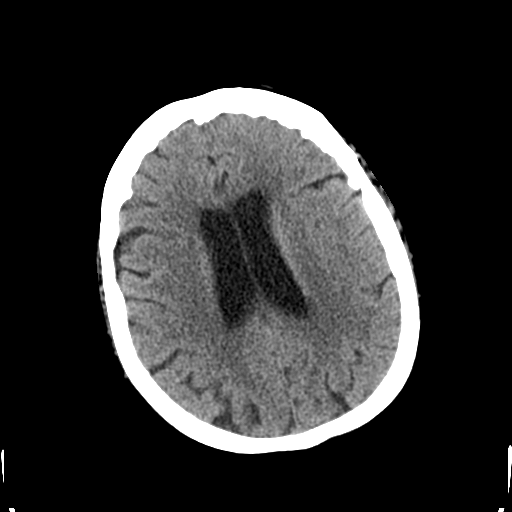
[im 22/31  brain]
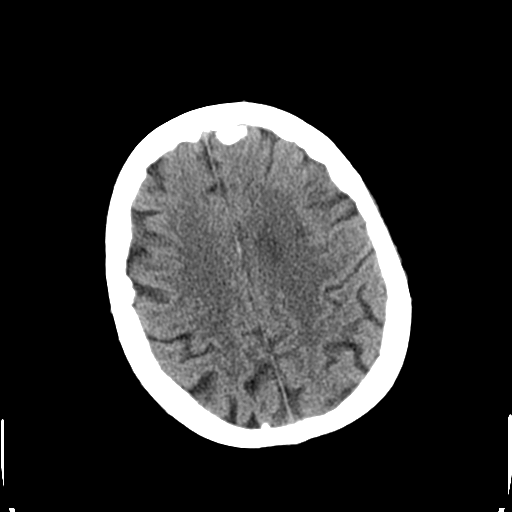
[im 25/31  brain]
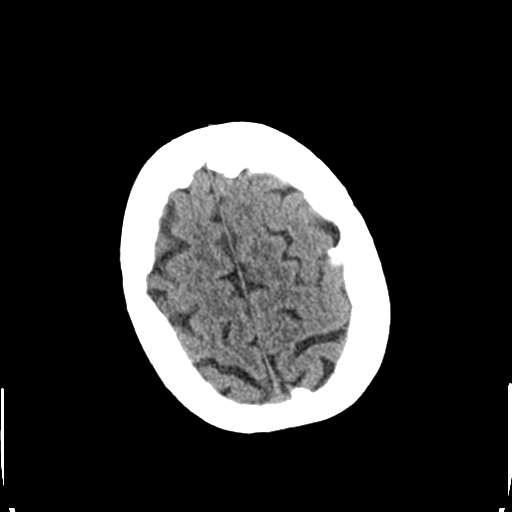
[im 28/31  brain]
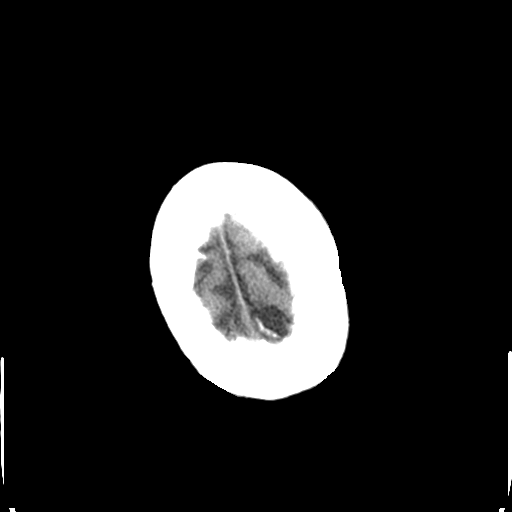
[im 28/31  bone]
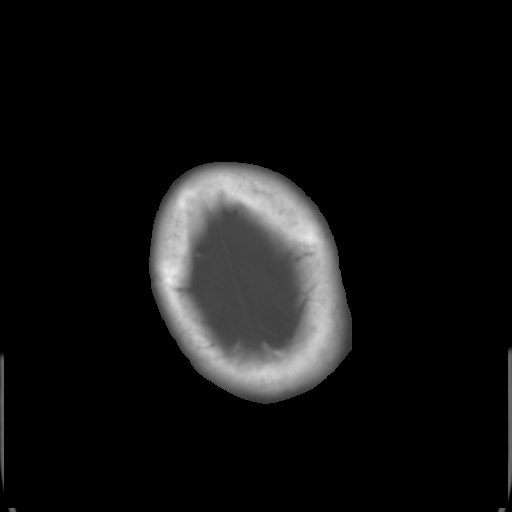

[Series 4: coronal soft tissue · coronal · 0.30mm/px · 3 of 66 slices shown]
[im 22/66  brain]
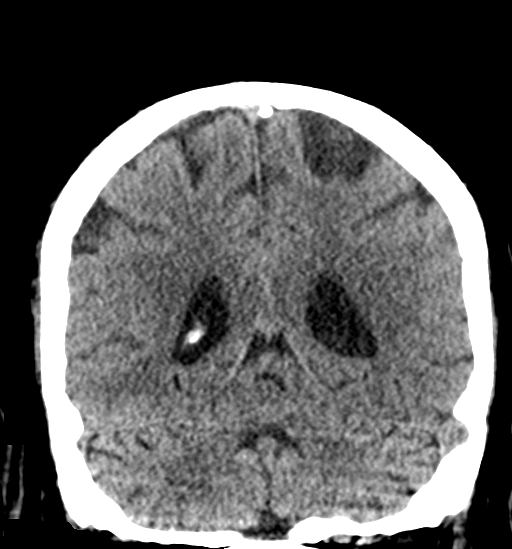
[im 29/66  brain]
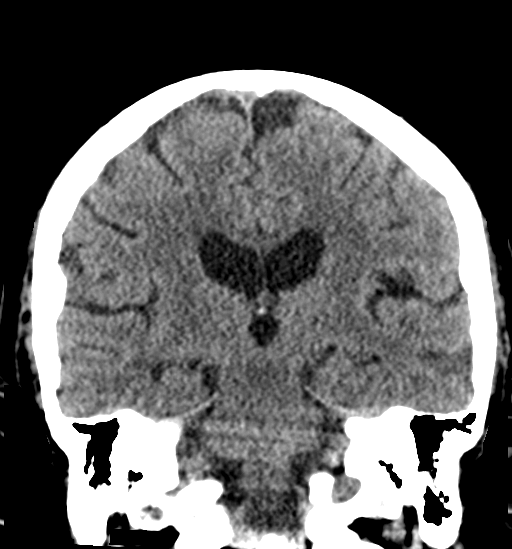
[im 37/66  brain]
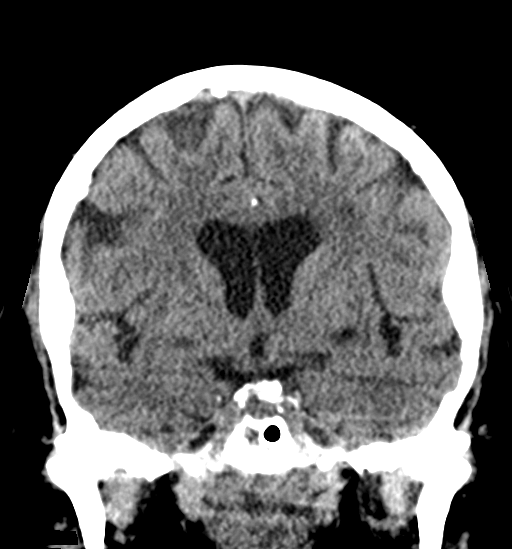

[Series 5: sagittal soft tissue · sagittal · 0.32mm/px · 3 of 52 slices shown]
[im 19/52  brain]
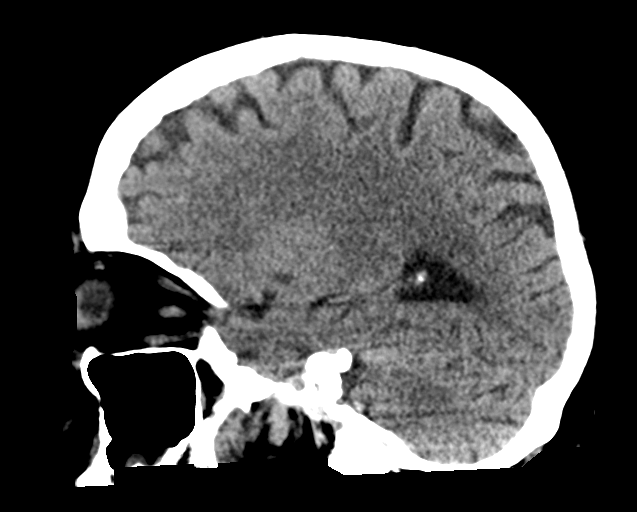
[im 26/52  brain]
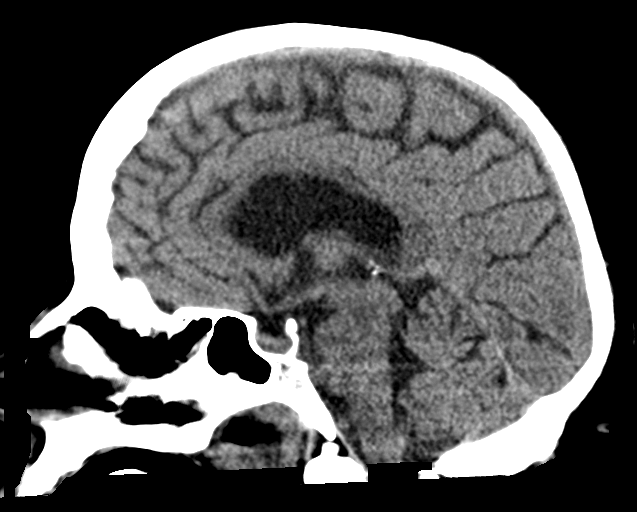
[im 34/52  brain]
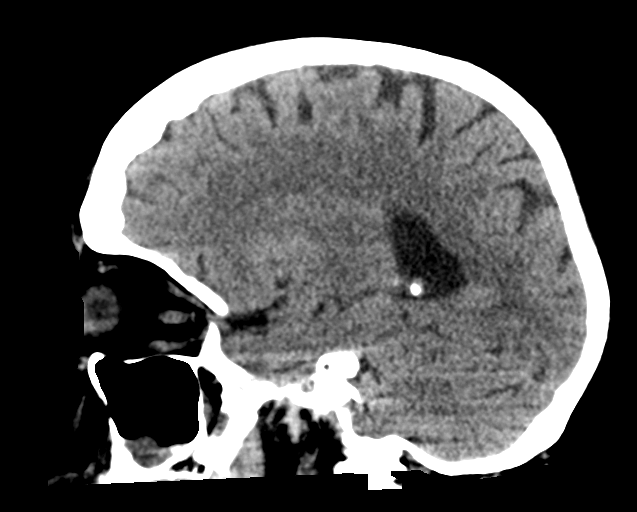

[15 of 47 positions shown; findings below may reference images not displayed]

FINDINGS: Brain: No evidence of acute infarction, hemorrhage, hydrocephalus,
extra-axial collection or mass lesion/mass effect.

Mild periventricular white matter change likely reflects small
vessel ischemic microangiopathy. A chronic lacunar infarct is noted
at the left basal ganglia.

The posterior fossa, including the cerebellum, brainstem and fourth
ventricle, is within normal limits. The third and lateral ventricles
are unremarkable in appearance. The cerebral hemispheres are
symmetric in appearance, with normal gray-white differentiation. No
mass effect or midline shift is seen.

Vascular: No hyperdense vessel or unexpected calcification.

Skull: There is no evidence of fracture; visualized osseous
structures are unremarkable in appearance.

Sinuses/Orbits: The orbits are within normal limits. Mucosal
thickening is noted at the maxillary sinuses bilaterally, with a
likely mucus retention cyst or polyp at the left maxillary sinus.
The remaining paranasal sinuses and mastoid air cells are
well-aerated.

Other: No significant soft tissue abnormalities are seen.
IMPRESSION: 1. No acute intracranial pathology seen on CT.
2. Mild small vessel ischemic microangiopathy.
3. Chronic lacunar infarct at the left basal ganglia.
4. Mucosal thickening at the maxillary sinuses bilaterally, with a
likely mucus retention cyst or polyp at the left maxillary sinus.

## 2019-02-01 IMAGING — DX DG ABD PORTABLE 1V
3 series · 3 of 3 positions shown · non-contrast
Comparison: Prior CT scan of the abdomen and pelvis 06/01/2018;
prior modified barium swallow 08/17/2018

CLINICAL DATA: 67-year-old female with abdominal pain

EXAM:
PORTABLE ABDOMEN - 1 VIEW

[abdomen kub (1 of 3)]
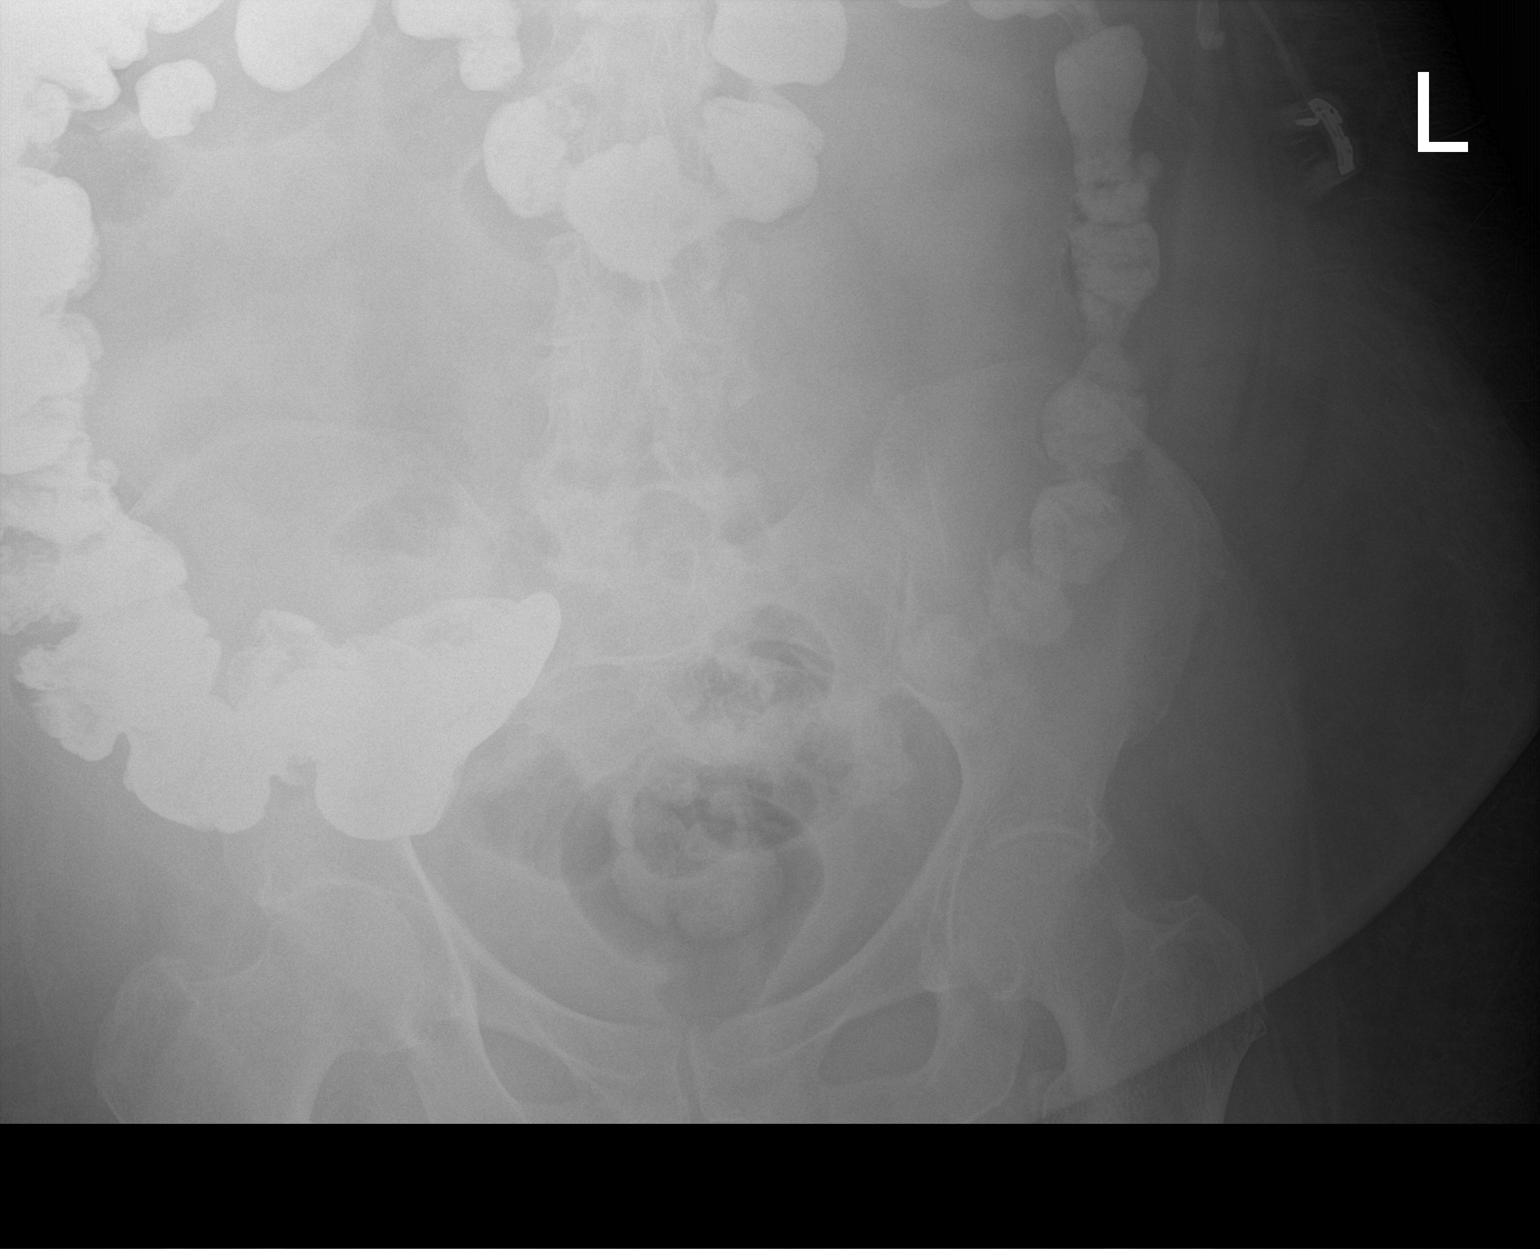

[abdomen kub (2 of 3)]
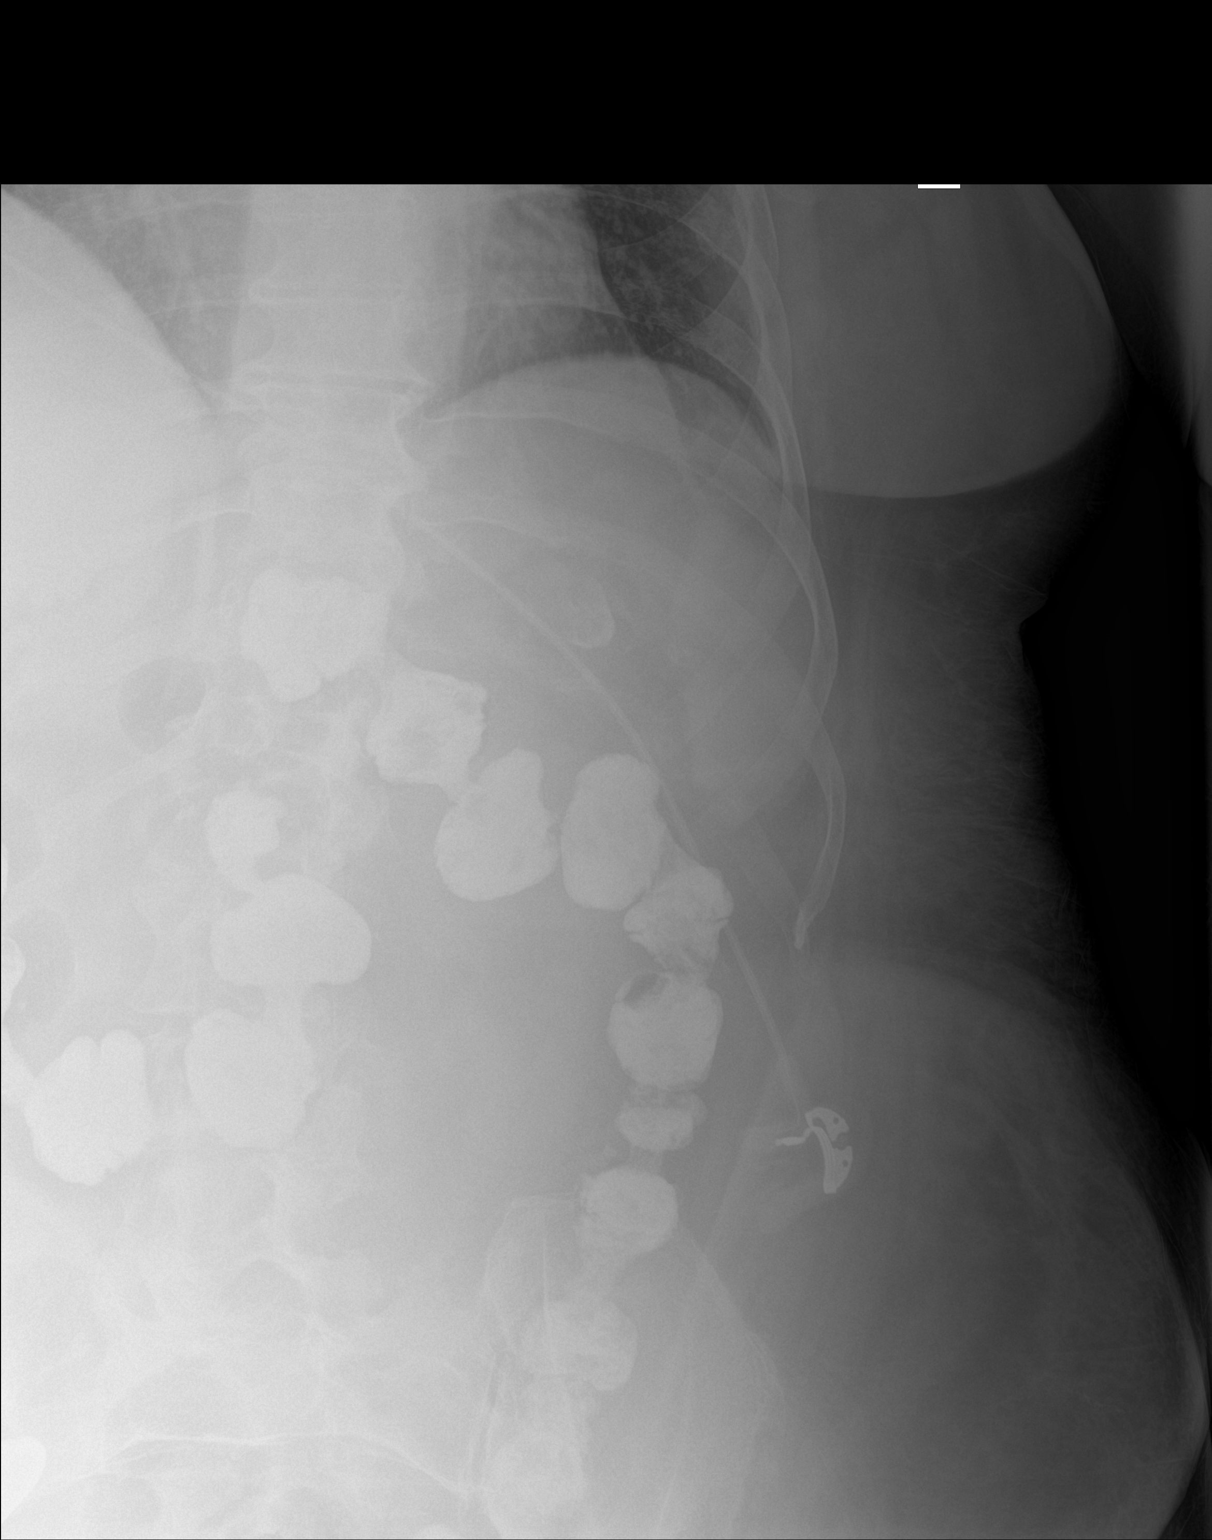

[abdomen kub (3 of 3)]
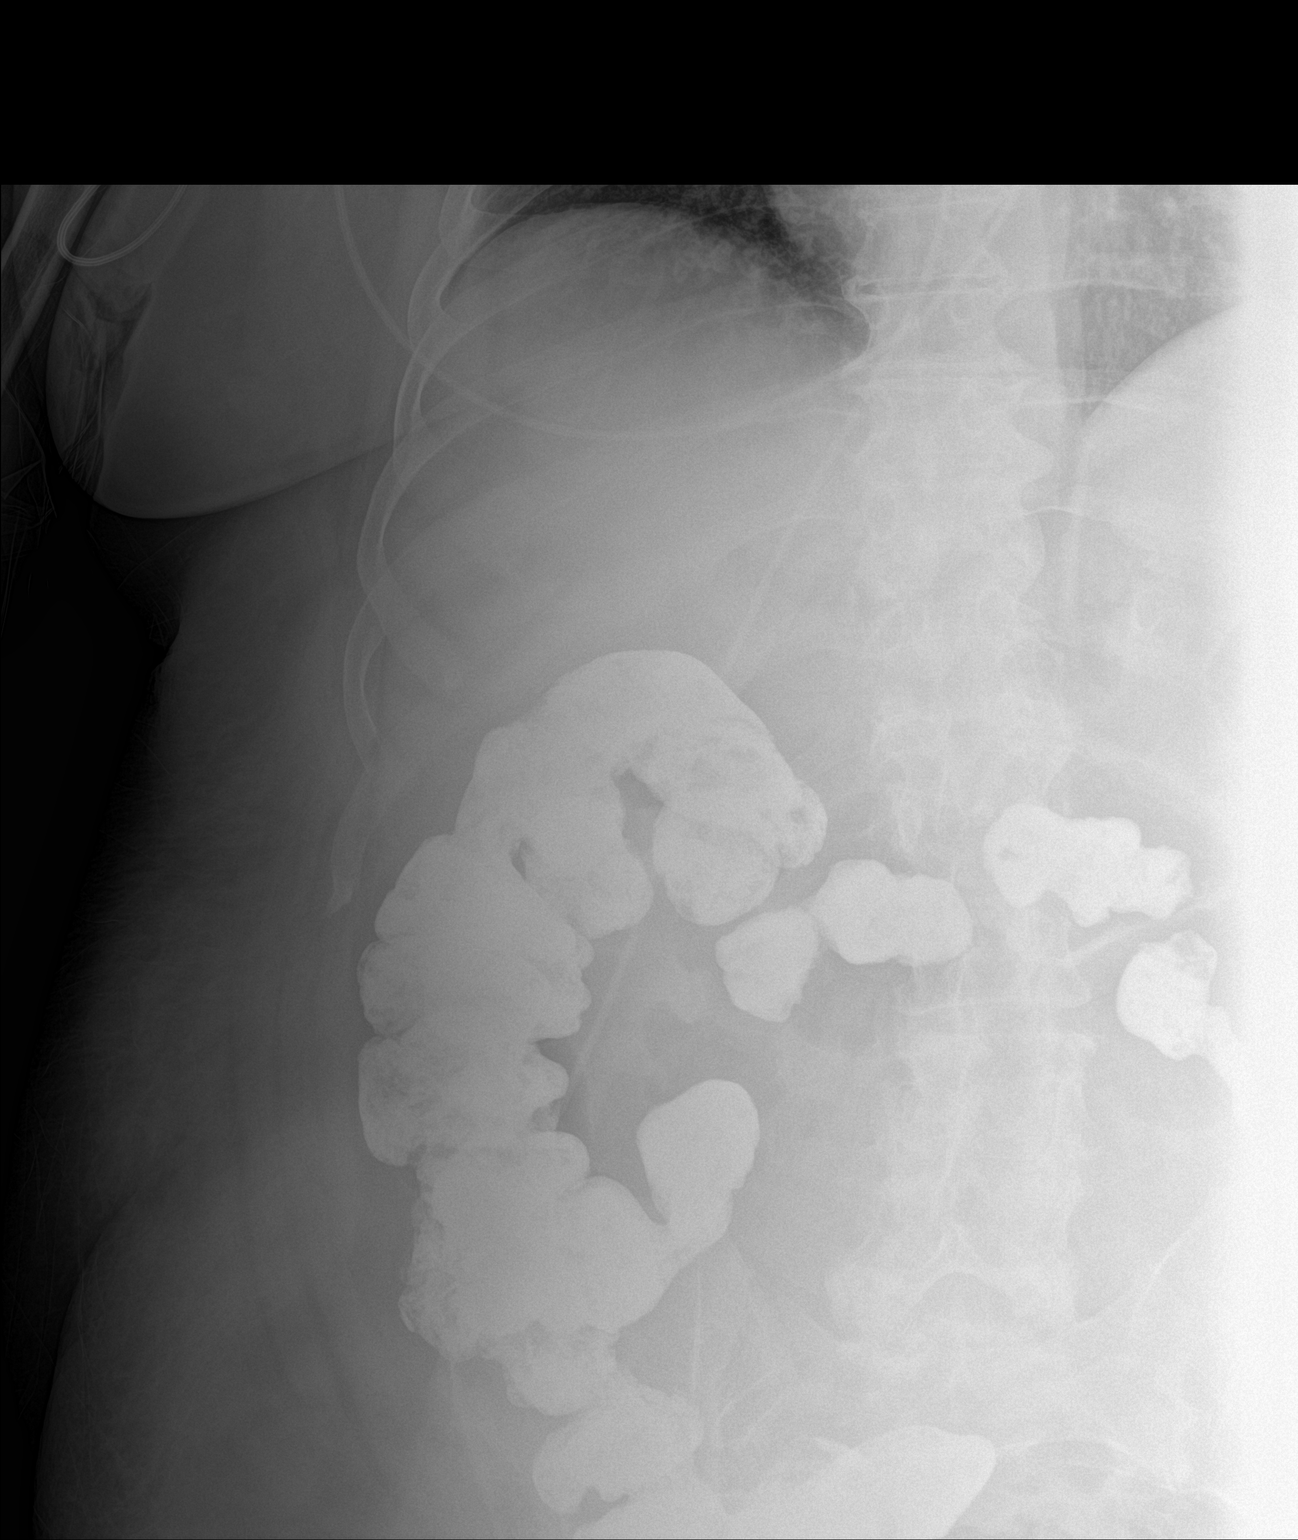

[3 of 3 positions shown; findings below may reference images not displayed]

FINDINGS: A total of 3 radiographs were required to encompass the entirety of
the abdomen. All images are supine. Incompletely imaged
cardiomegaly. The lung bases are grossly clear. No significant bowel
dilatation. Previously ingested oral contrast material opacifies the
colon. Unremarkable colonic stool burden. There are a few small
diverticula in both the ascending and descending colon.
IMPRESSION: No evidence of bowel obstruction. Previously ingested contrast
material opacifies the colon.

## 2019-02-04 IMAGING — CT CT HEAD W/O CM
3 series · 15 of 47 positions shown, 18 images · non-contrast
Comparison: Head CT 8 hours ago.

CLINICAL DATA: Altered level of consciousness (LOC), unexplained

EXAM:
CT HEAD WITHOUT CONTRAST
TECHNIQUE: Contiguous axial images were obtained from the base of the skull
through the vertex without intravenous contrast.

[Series 2: head trauma wo · axial · 0.42mm/px · z∈[+1325,+1455]mm · 9 of 32 slices shown, 12 images]
[im 3/32  brain]
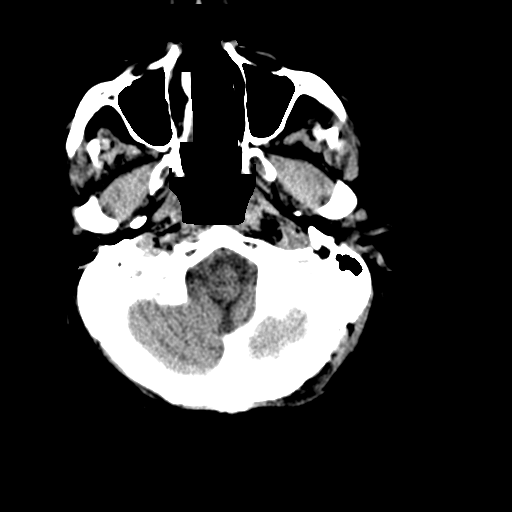
[im 3/32  bone]
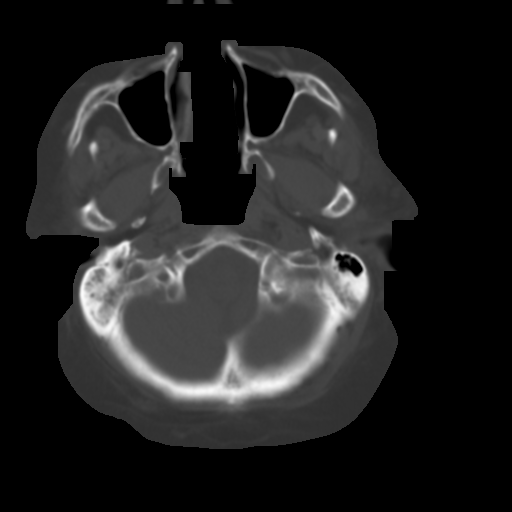
[im 6/32  brain]
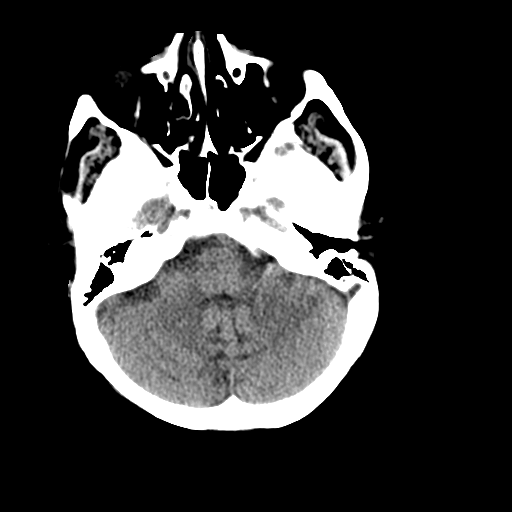
[im 9/32  brain]
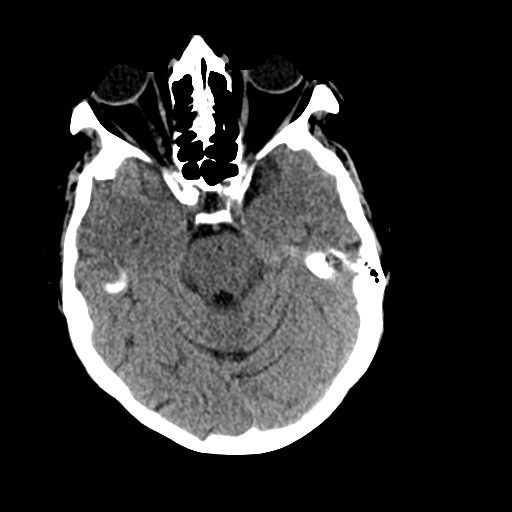
[im 12/32  brain]
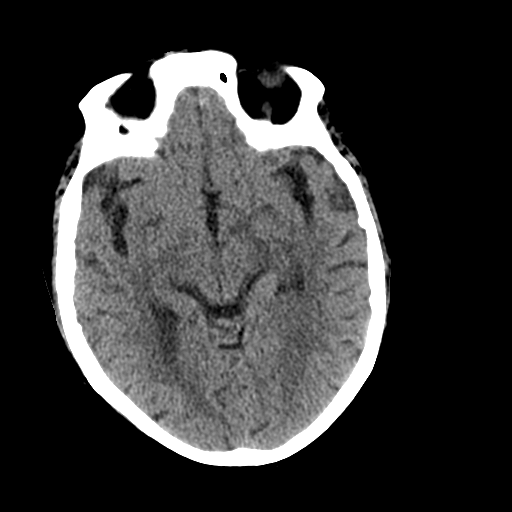
[im 17/32  brain]
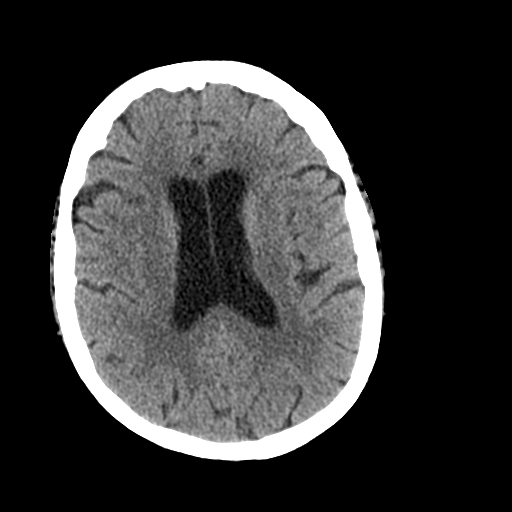
[im 17/32  bone]
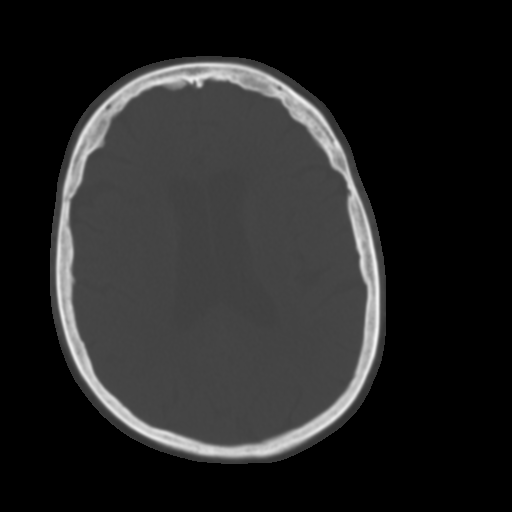
[im 20/32  brain]
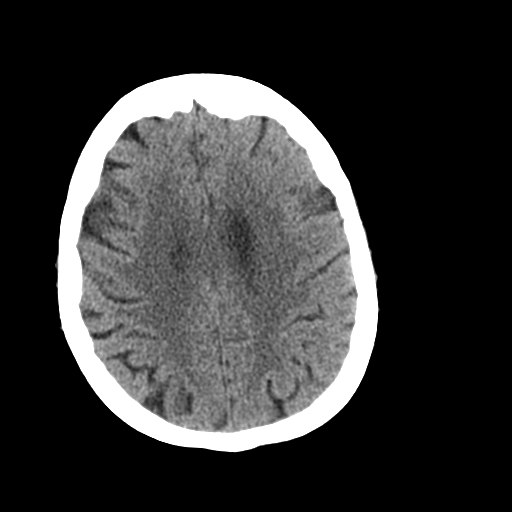
[im 23/32  brain]
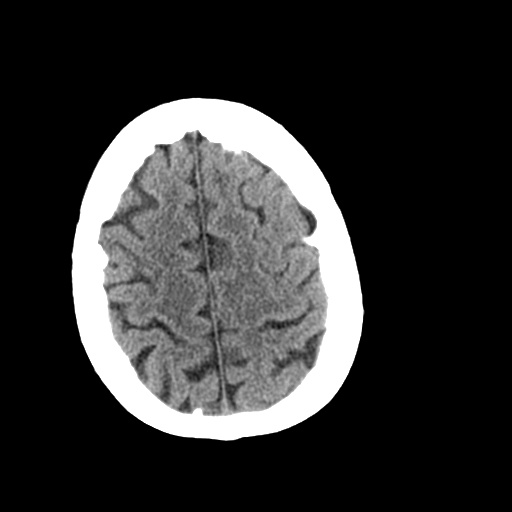
[im 26/32  brain]
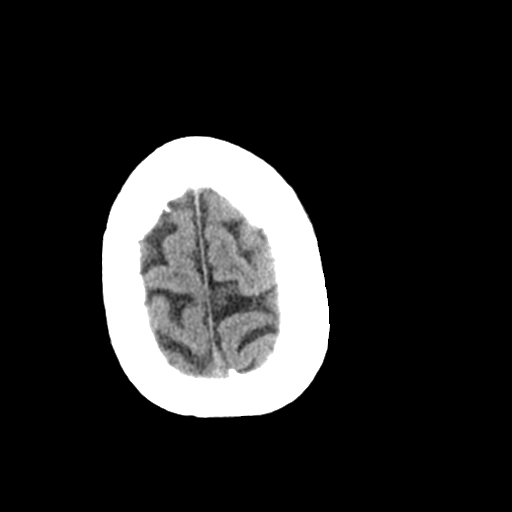
[im 29/32  brain]
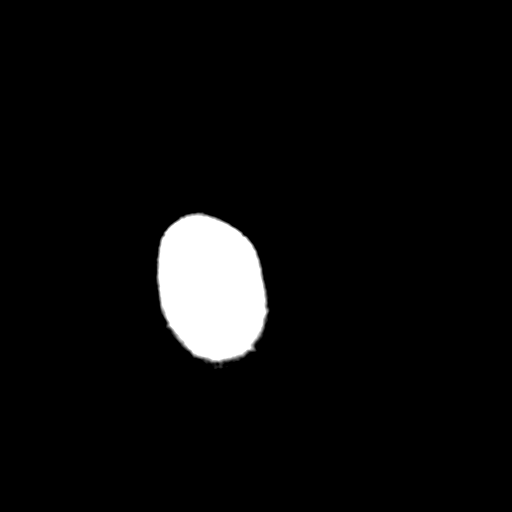
[im 29/32  bone]
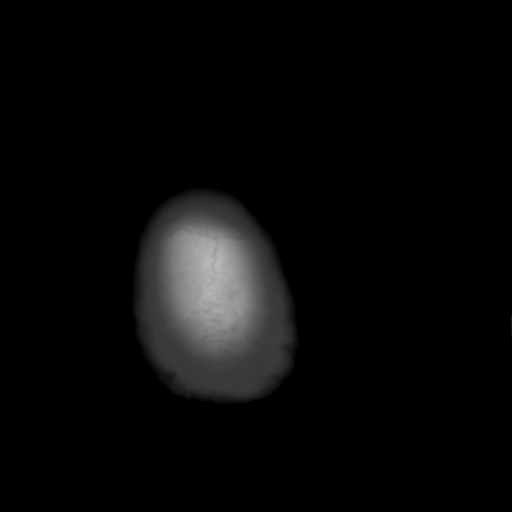

[Series 4: coronal soft tissue · coronal · 0.32mm/px · 3 of 60 slices shown]
[im 20/60  brain]
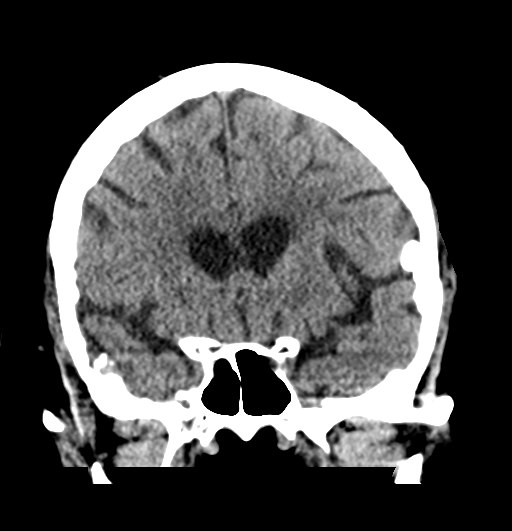
[im 27/60  brain]
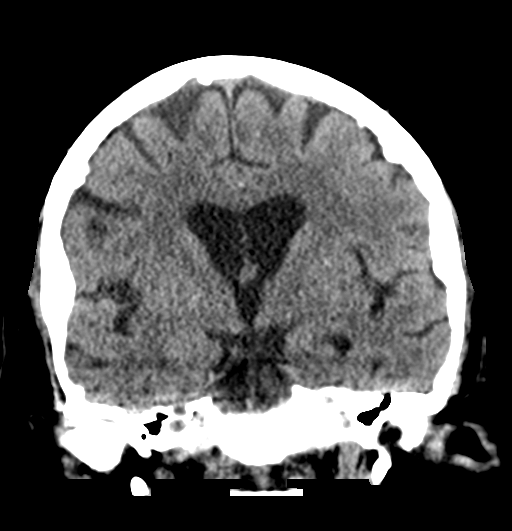
[im 33/60  brain]
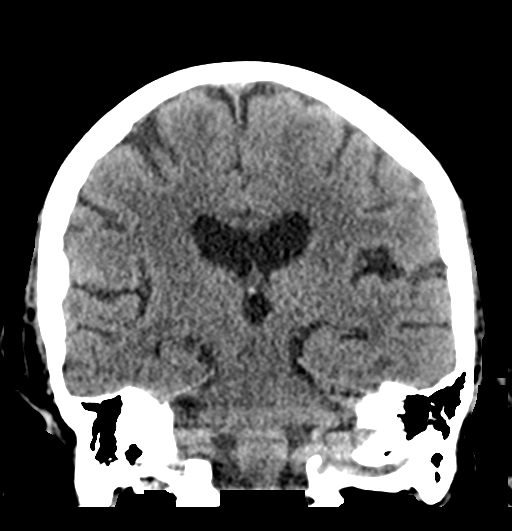

[Series 5: sagittal soft tissue · sagittal · 0.33mm/px · 3 of 51 slices shown]
[im 17/51  brain]
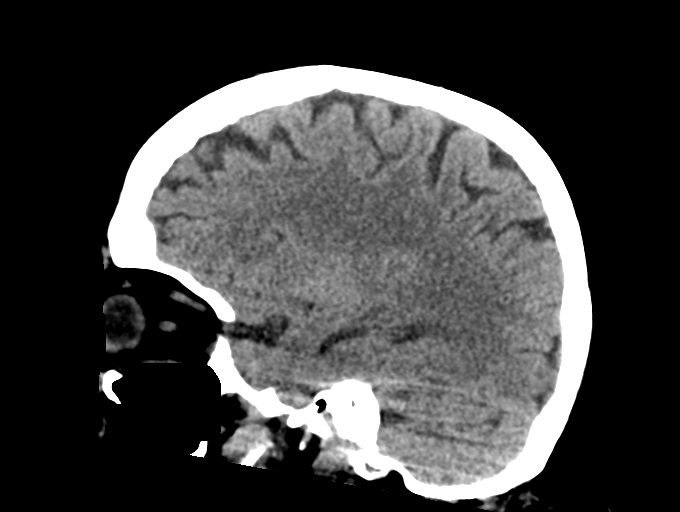
[im 26/51  brain]
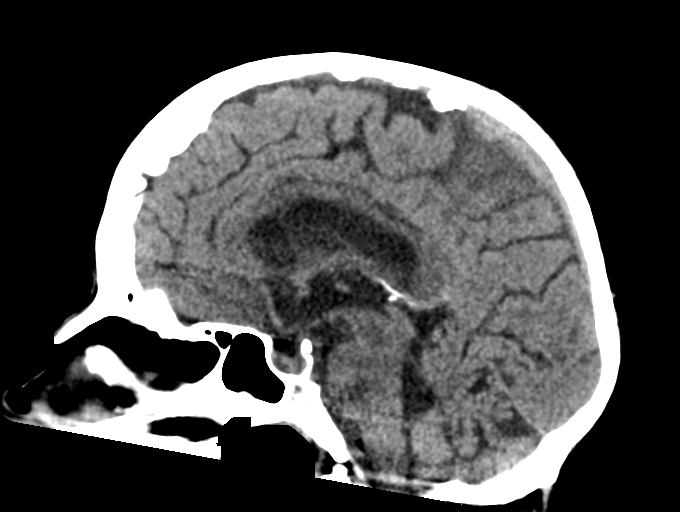
[im 34/51  brain]
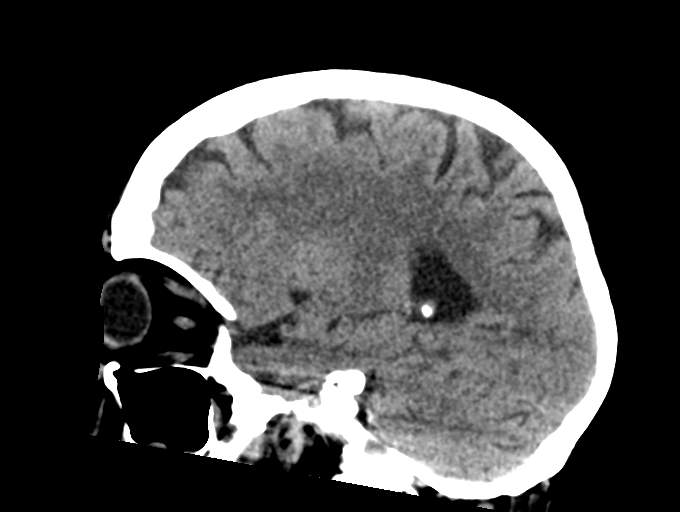

[15 of 47 positions shown; findings below may reference images not displayed]

FINDINGS: Brain: No change from prior exam. Remote lacunar infarct in left
basal ganglia and chronic small vessel ischemia, stable. No
intracranial hemorrhage, mass effect, or midline shift. No
hydrocephalus. The basilar cisterns are patent. No evidence of
territorial infarct or acute ischemia. No extra-axial or
intracranial fluid collection.

Vascular: Atherosclerosis of skullbase vasculature without
hyperdense vessel or abnormal calcification.

Skull: No fracture or focal lesion. Suspect hyperostosis rather than
extra-axial meningioma in the frontal regions bilaterally.

Sinuses/Orbits: No acute finding.

Other: None.
IMPRESSION: No acute intracranial abnormality or change from prior exams.

## 2019-02-21 ENCOUNTER — Telehealth: Payer: Self-pay | Admitting: Neurology

## 2019-02-21 ENCOUNTER — Ambulatory Visit: Payer: Medicare Other | Admitting: Neurology

## 2019-02-21 NOTE — Telephone Encounter (Signed)
Chart review.

## 2019-02-21 NOTE — Telephone Encounter (Signed)
Noted  

## 2019-02-21 NOTE — Telephone Encounter (Signed)
Supervisor as the patients living facility called and stated that an emergency has come up and patient cannot make apt today.

## 2019-02-21 NOTE — Telephone Encounter (Signed)
This patient canceled the same day of a revisit appointment today. 

## 2019-02-26 ENCOUNTER — Encounter: Payer: Self-pay | Admitting: Neurology

## 2019-02-28 IMAGING — CT CT HEAD W/O CM
3 series · 14 of 47 positions shown, 16 images · non-contrast
Comparison: 08/22/2018

CLINICAL DATA: LEFT-sided weakness since last night, slurred speech
this morning, altered level of consciousness, history CHF, stage III
chronic kidney disease, COPD, essential hypertension, type II
diabetes mellitus, prior pulmonary embolism

EXAM:
CT HEAD WITHOUT CONTRAST
TECHNIQUE: Contiguous axial images were obtained from the base of the skull
through the vertex without intravenous contrast. Sagittal and
coronal MPR images reconstructed from axial data set.

[Series 2: head wo · axial · 0.43mm/px · z∈[-54,+76]mm · 8 of 32 slices shown, 10 images]
[im 3/32  brain]
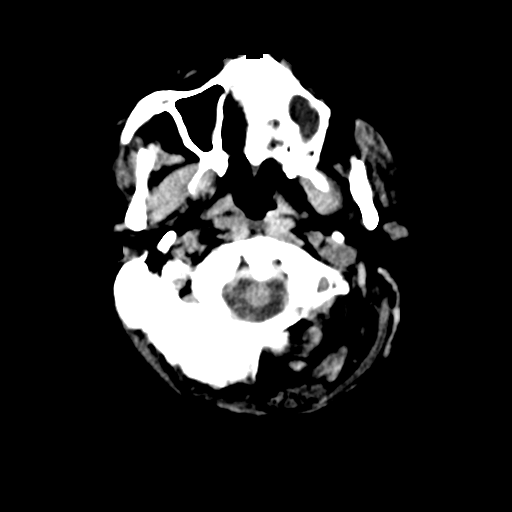
[im 3/32  bone]
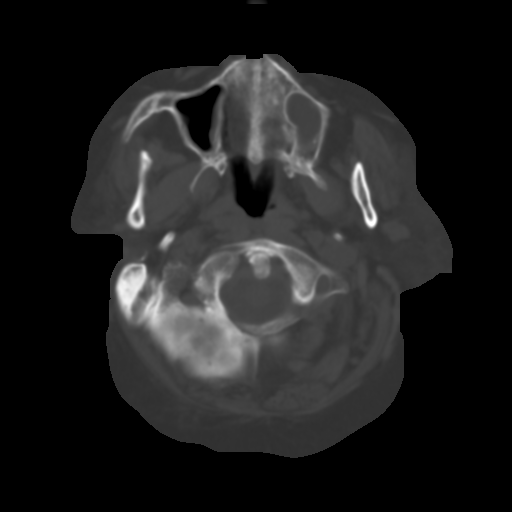
[im 7/32  brain]
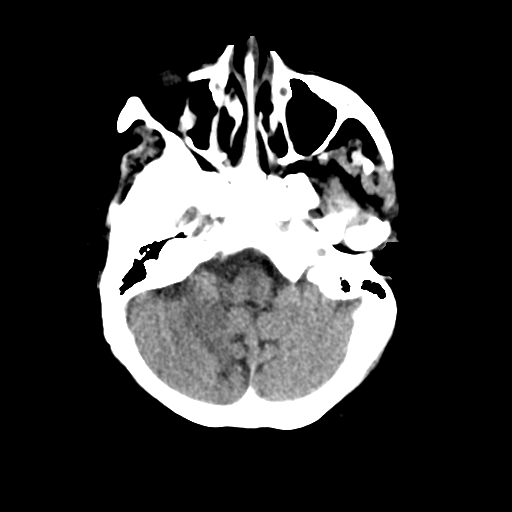
[im 10/32  brain]
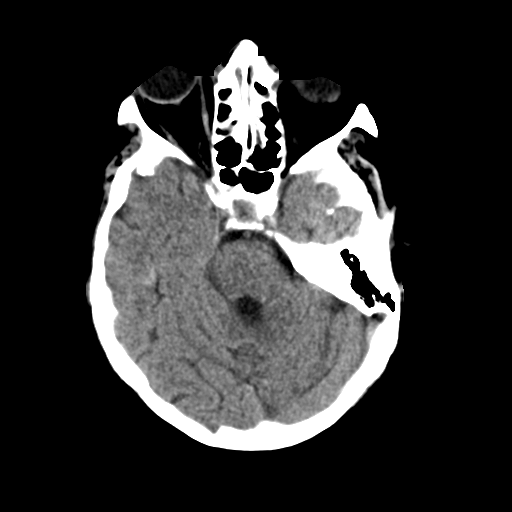
[im 14/32  brain]
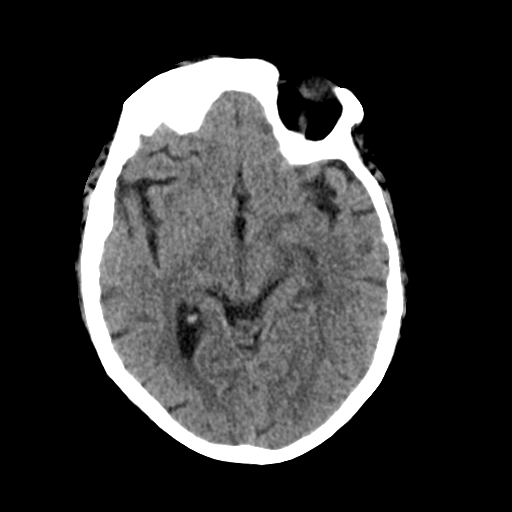
[im 18/32  brain]
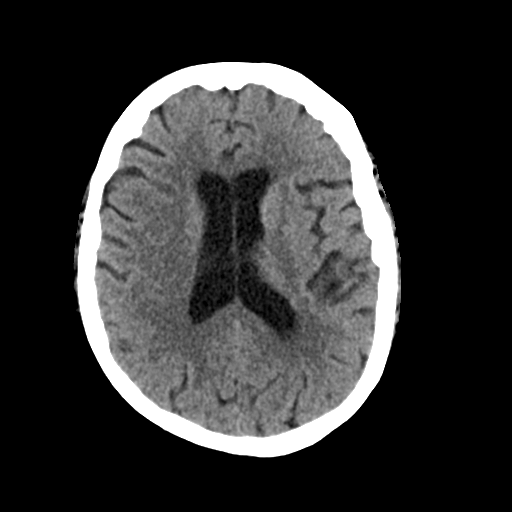
[im 18/32  bone]
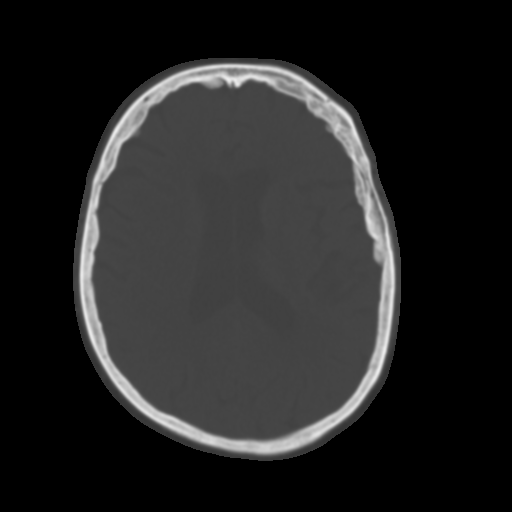
[im 22/32  brain]
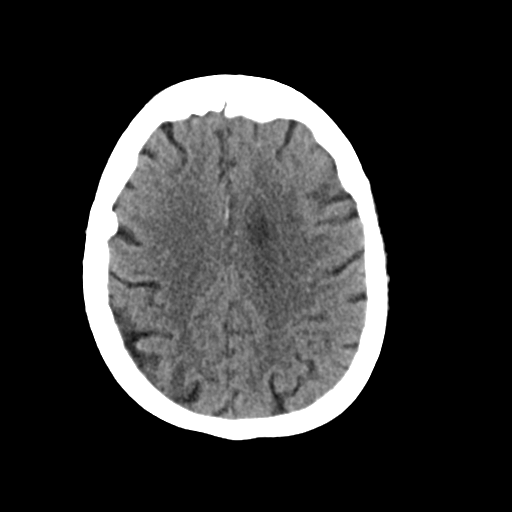
[im 25/32  brain]
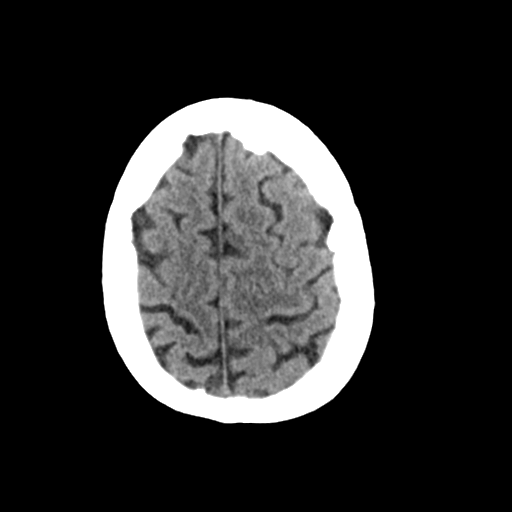
[im 29/32  brain]
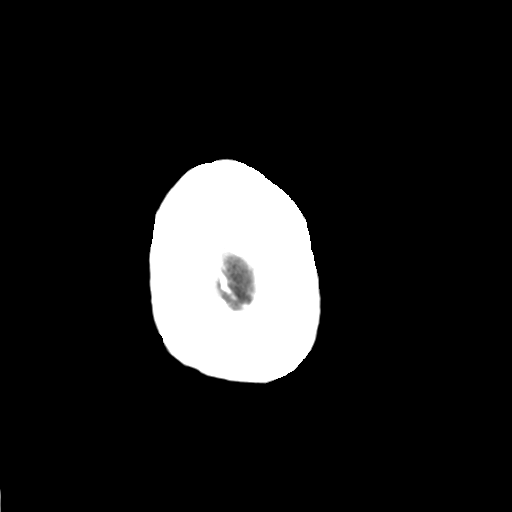

[Series 4: coronal soft tissue · coronal · 0.32mm/px · 3 of 67 slices shown]
[im 23/67  brain]
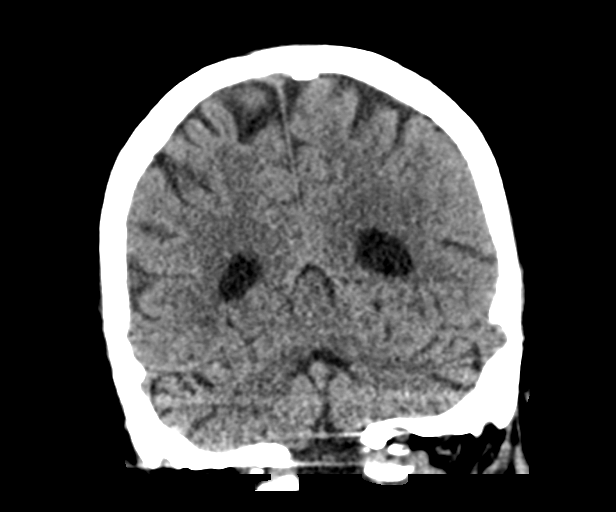
[im 30/67  brain]
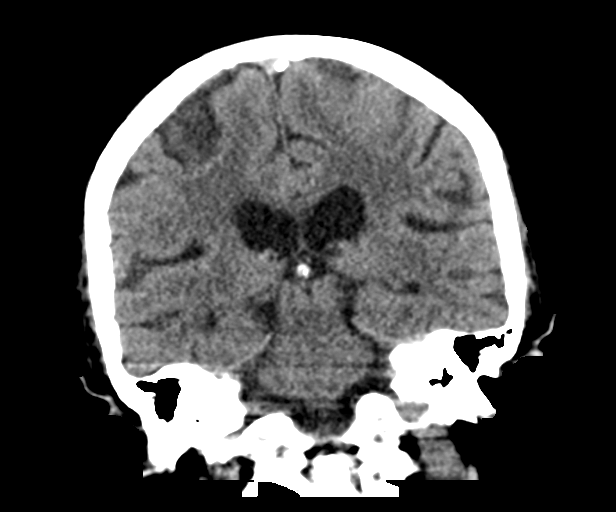
[im 37/67  brain]
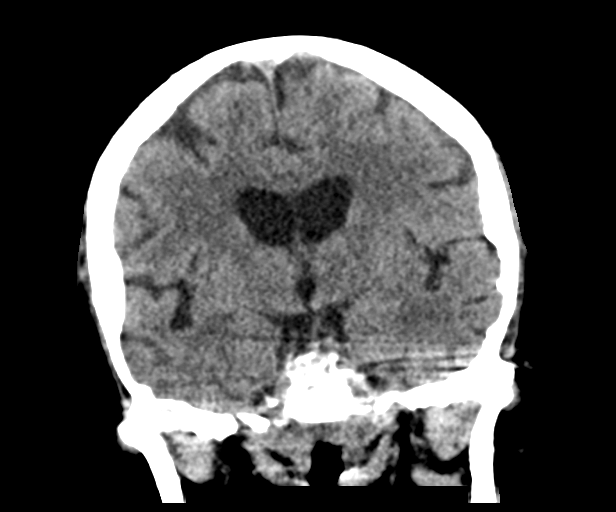

[Series 5: sagittal soft tissue · sagittal · 0.31mm/px · 3 of 56 slices shown]
[im 19/56  brain]
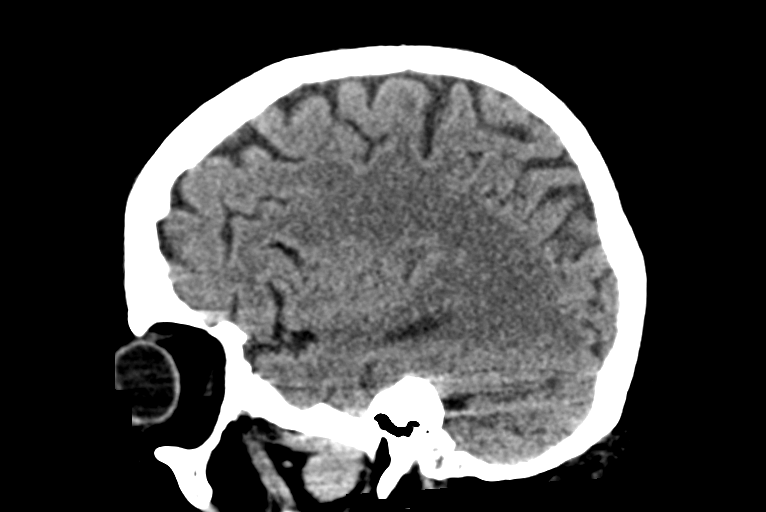
[im 28/56  brain]
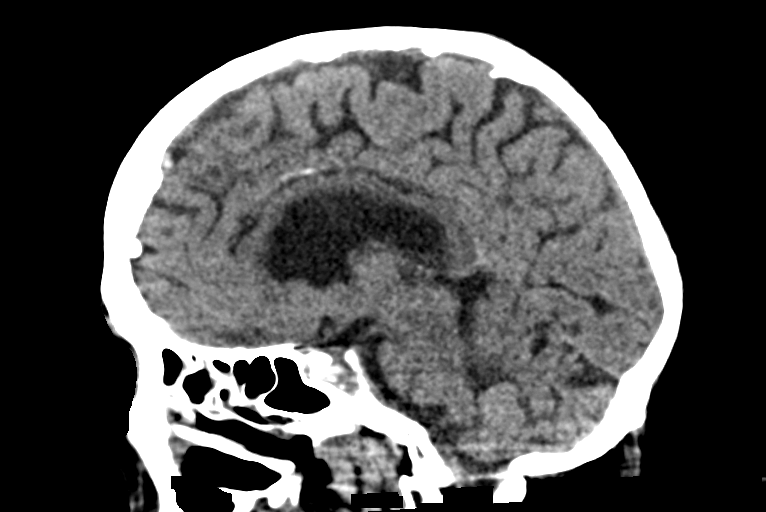
[im 37/56  brain]
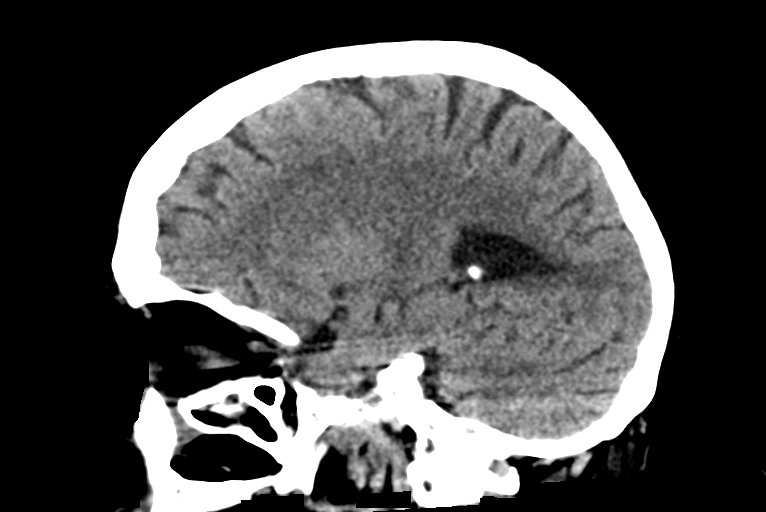

[14 of 47 positions shown; findings below may reference images not displayed]

FINDINGS: Brain: Generalized atrophy. Normal ventricular morphology. No
midline shift or mass effect. Small vessel chronic ischemic changes
of deep cerebral white matter. Old lacunar infarct versus prominent
perivascular space at LEFT basal ganglia. No intracranial
hemorrhage, mass lesion, evidence of acute infarction, or
extra-axial fluid collection.

Vascular: Minimal atherosclerotic calcifications of internal carotid
arteries at skull base.

Skull: Intact.  Scattered dural calcifications bilaterally.

Sinuses/Orbits: Mucosal thickening throughout ethmoid air cells and
maxillary sinuses. Small mucosal retention cysts within maxillary
sinuses.

Other: N/A
IMPRESSION: Atrophy with small vessel chronic ischemic changes of deep cerebral
white matter.

Old LEFT basal ganglia lacunar infarct versus prominent perivascular
space unchanged.

No acute intracranial abnormalities.

## 2019-03-12 DIAGNOSIS — M79671 Pain in right foot: Secondary | ICD-10-CM | POA: Diagnosis not present

## 2019-03-12 DIAGNOSIS — L110 Acquired keratosis follicularis: Secondary | ICD-10-CM | POA: Diagnosis not present

## 2019-03-12 DIAGNOSIS — E1140 Type 2 diabetes mellitus with diabetic neuropathy, unspecified: Secondary | ICD-10-CM | POA: Diagnosis not present

## 2019-03-12 DIAGNOSIS — M79672 Pain in left foot: Secondary | ICD-10-CM | POA: Diagnosis not present

## 2019-03-12 IMAGING — MR MR HEAD W/O CM
8 of 10 series · 42 of 48 positions shown · non-contrast
Comparison: 09/15/2018

CLINICAL DATA: Altered level of consciousness.

EXAM:
MRI HEAD WITHOUT CONTRAST
TECHNIQUE: Multiplanar, multiecho pulse sequences of the brain and surrounding
structures were obtained without intravenous contrast.

[Series 3: DWI · axial · 3.0mm · 0.76mm/px · z∈[-104,+55]mm · 7 of 55 slices shown (1 of 4)]
[im 1/55]
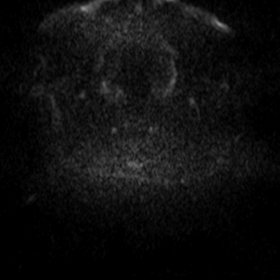
[im 10/55]
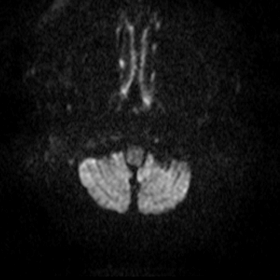
[im 19/55]
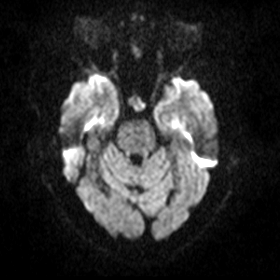
[im 28/55]
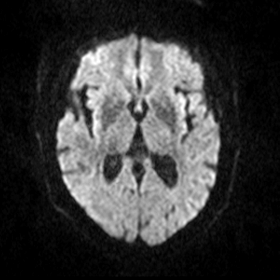
[im 37/55]
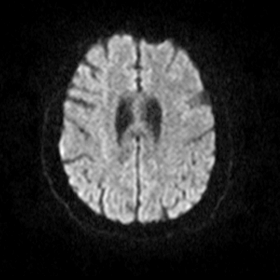
[im 46/55]
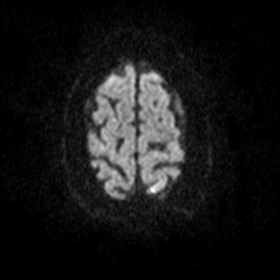
[im 55/55]
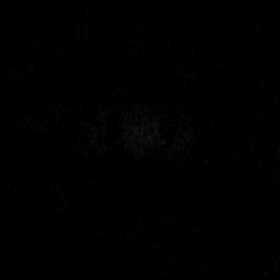

[Series 4: DWI · axial · 3.0mm · 0.76mm/px · z∈[-104,+55]mm · 7 of 55 slices shown (2 of 4)]
[im 1/55]
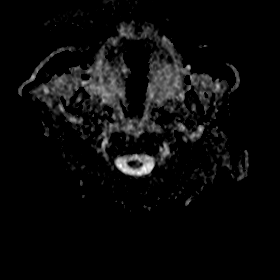
[im 10/55]
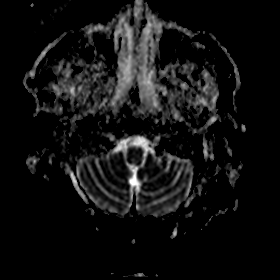
[im 19/55]
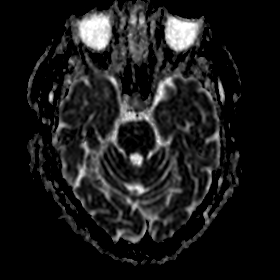
[im 28/55]
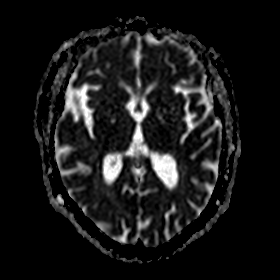
[im 37/55]
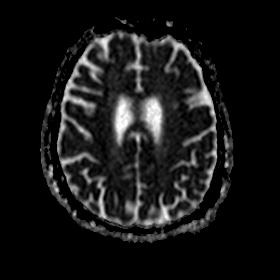
[im 46/55]
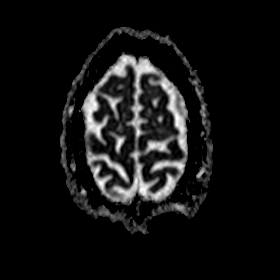
[im 55/55]
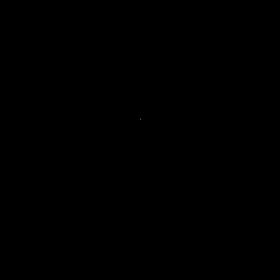

[Series 5: DWI · coronal · 5.0mm · 0.47mm/px · 4 of 34 slices shown (3 of 4)]
[im 1/34]
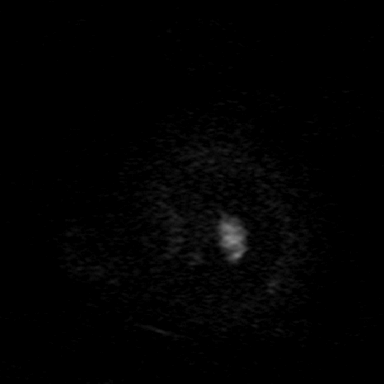
[im 12/34]
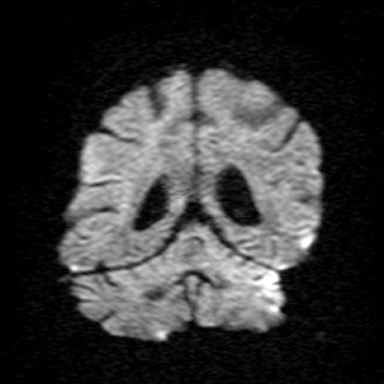
[im 23/34]
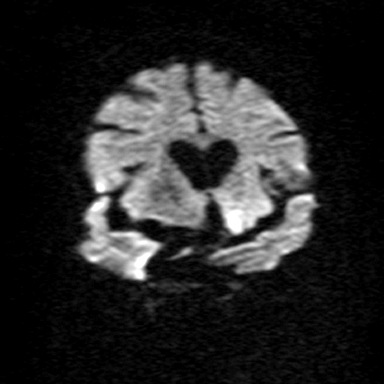
[im 34/34]
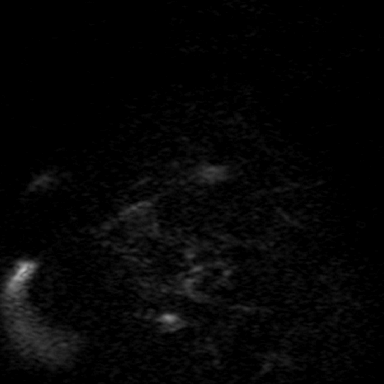

[Series 6: DWI · coronal · 5.0mm · 0.47mm/px · 4 of 34 slices shown (4 of 4)]
[im 1/34]
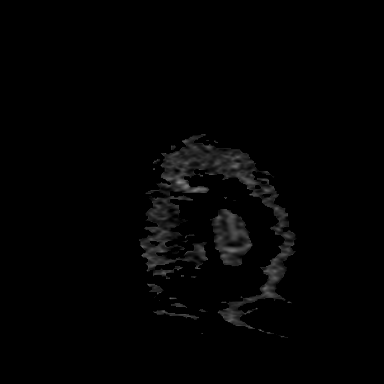
[im 12/34]
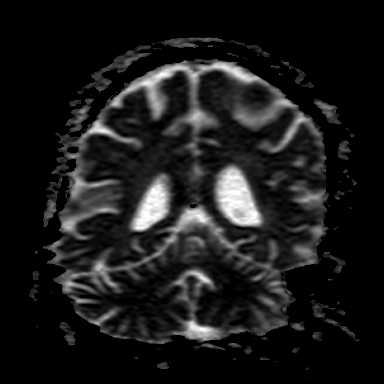
[im 23/34]
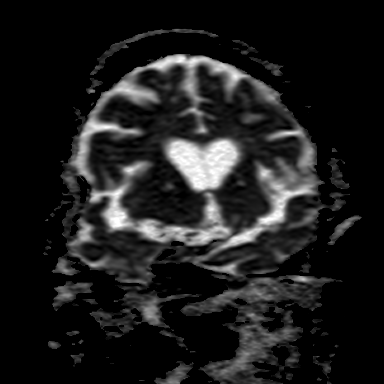
[im 34/34]
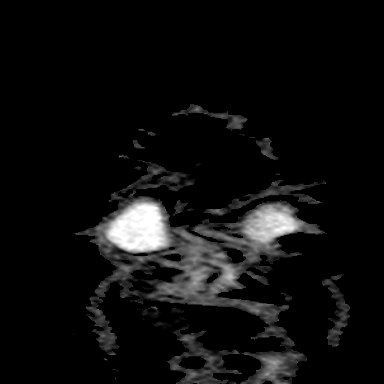

[Series 7: T2 · axial · 5.0mm · 0.63mm/px · z∈[-94,+46]mm · 3 of 23 slices shown (1 of 2)]
[im 1/23]
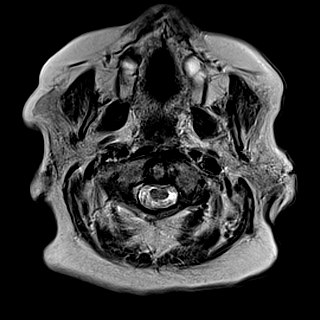
[im 12/23]
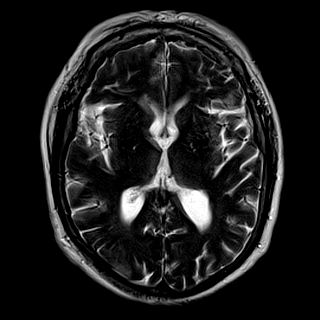
[im 23/23]
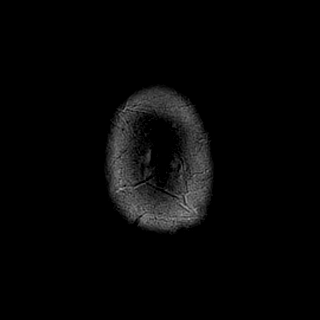

[Series 8: FLAIR · axial · 3.0mm · 0.79mm/px · z∈[-91,+45]mm · 6 of 47 slices shown]
[im 1/47]
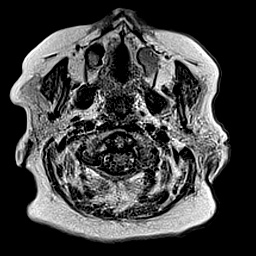
[im 10/47]
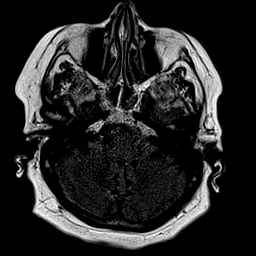
[im 19/47]
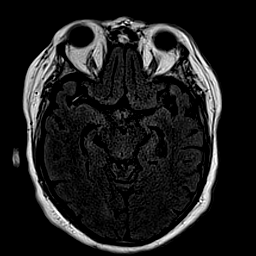
[im 28/47]
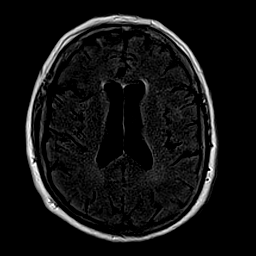
[im 37/47]
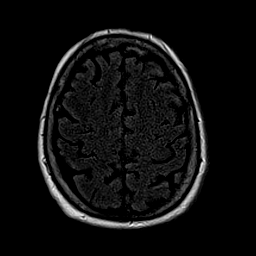
[im 47/47]
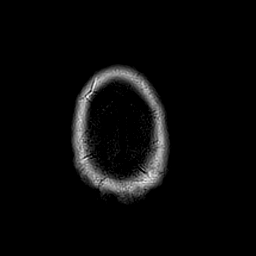

[Series 9: T1 · axial · 2.0mm · 0.39mm/px · z∈[-93,+78]mm · 8 of 88 slices shown]
[im 1/88]
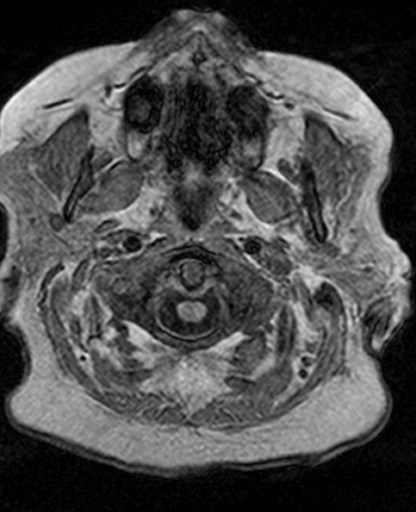
[im 10/88]
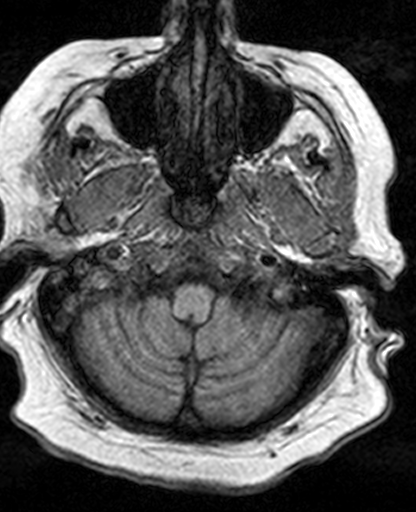
[im 30/88]
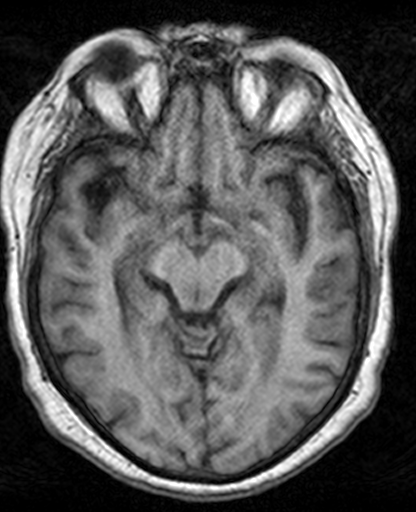
[im 39/88]
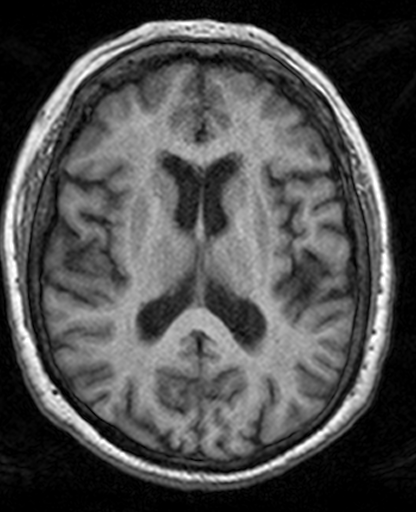
[im 49/88]
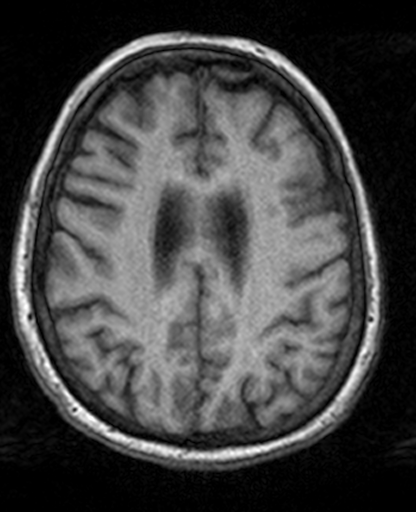
[im 59/88]
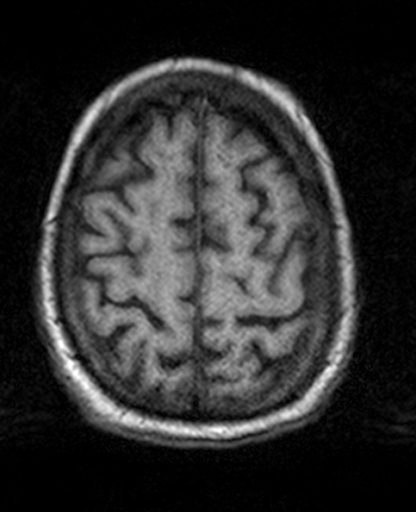
[im 78/88]
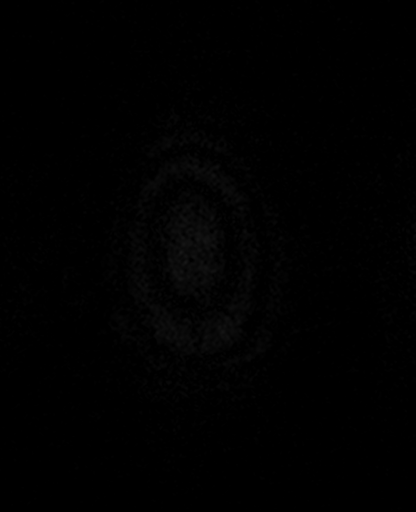
[im 88/88]
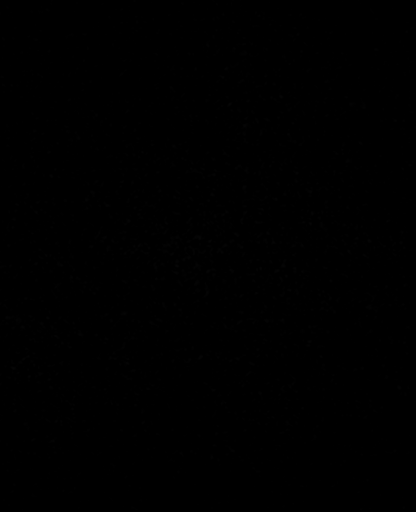

[Series 11: T2 · coronal · 5.0mm · 0.56mm/px · 3 of 28 slices shown (2 of 2)]
[im 1/28]
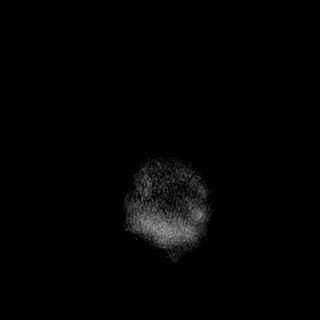
[im 14/28]
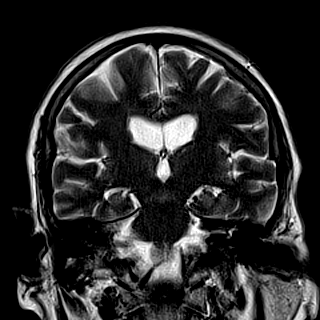
[im 28/28]
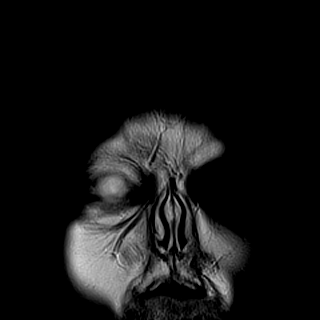

[42 of 48 positions shown; findings below may reference images not displayed]

FINDINGS: Some sequences are mildly to moderately motion degraded.

Brain: There is no evidence of acute infarct, intracranial
hemorrhage, mass, midline shift, or extra-axial fluid collection.
Mild cerebral atrophy is again noted. Scattered small foci of T2
hyperintensity in the cerebral white matter are unchanged and
nonspecific but compatible with mild chronic small vessel ischemic
disease. A small chronic infarct in the medial right cerebellar
hemisphere is unchanged.

Vascular: Major intracranial vascular flow voids are preserved.

Skull and upper cervical spine: Unremarkable bone marrow signal.

Sinuses/Orbits: Bilateral cataract extraction. Mild mucosal
thickening in the paranasal sinuses. Small left maxillary sinus
mucous retention cyst. Trace bilateral mastoid fluid.

Other: None.
IMPRESSION: 1. No acute intracranial abnormality.
2. Mild chronic small vessel ischemic disease and cerebral atrophy.

## 2019-03-20 DIAGNOSIS — F29 Unspecified psychosis not due to a substance or known physiological condition: Secondary | ICD-10-CM | POA: Diagnosis not present

## 2019-03-22 ENCOUNTER — Ambulatory Visit (INDEPENDENT_AMBULATORY_CARE_PROVIDER_SITE_OTHER): Payer: Medicare Other | Admitting: Neurology

## 2019-03-22 ENCOUNTER — Encounter: Payer: Self-pay | Admitting: Neurology

## 2019-03-22 DIAGNOSIS — G9340 Encephalopathy, unspecified: Secondary | ICD-10-CM

## 2019-03-22 NOTE — Progress Notes (Signed)
      Virtual Visit via Telephone Note  I connected with [name] on 03/22/19 at 12:00 PM EDT by telephone and verified that I am speaking with Kathleen correct person using two identifiers.  Location: Cross: [name] is in nursing home Provider: Physician and office   I discussed Kathleen limitations, risks, security and privacy concerns of performing an evaluation and management service by telephone and Kathleen availability of in person appointments. I also discussed with [name] that there may be a Cross responsible charge related to this service. [name] expressed understanding and agreed to proceed.   History of Present Illness: [name] is a 68 year old right-handed white female with a history of a schizoaffective disorder.  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  [name] was on Keppra when last seen which may induce psychosis, she was taken off of this and switched to Lamictal, but she went to Kathleen hospital on 28 November 2018 with altered mental status, and for some reason Kathleen Lamictal was discontinued at that time.  [name] however has done quite well, she does take primidone in Kathleen evening hours.  She has not had any further episodes of catatonia, she remains alert during Kathleen day and she is exercising, losing weight, her diabetes is under better control.  [name] feels quite well.  She does have history of a diabetic peripheral neuropathy, she is walking with a walker, she has not had any falls.   Observations/Objective: Kathleen telephone evaluation reveals that [name] is alert and cooperative, she is answering questions appropriately.  Assessment and Plan: 1.  Schizoaffective disorder  2.  Questionable history of seizures  3.  Diabetic peripheral neuropathy  4.  Episodes of altered mental status, catatonia versus seizures  [name] is doing well, she is not on Lamictal at this point, we will not  restart any other medications.  She will follow-up through this office in about 6 months.  If any concerns are noted, they are to contact our office.  Follow Up Instructions:    I discussed Kathleen assessment and treatment plan with [name]. [name] was provided an opportunity to ask questions and all were answered. [name] agreed with Kathleen plan and demonstrated an understanding of Kathleen instructions.   [name] was advised to call back or seek an in-person evaluation if Kathleen symptoms worsen or if Kathleen condition fails to improve as anticipated.  I provided 15 minutes of non-face-to-face time during this encounter.   Kathrynn Ducking, MD

## 2019-03-26 ENCOUNTER — Encounter: Payer: Self-pay | Admitting: Neurology

## 2019-03-26 DIAGNOSIS — L89892 Pressure ulcer of other site, stage 2: Secondary | ICD-10-CM | POA: Diagnosis not present

## 2019-03-26 DIAGNOSIS — E1140 Type 2 diabetes mellitus with diabetic neuropathy, unspecified: Secondary | ICD-10-CM | POA: Diagnosis not present

## 2019-03-29 DIAGNOSIS — E1165 Type 2 diabetes mellitus with hyperglycemia: Secondary | ICD-10-CM | POA: Diagnosis not present

## 2019-03-29 DIAGNOSIS — M549 Dorsalgia, unspecified: Secondary | ICD-10-CM | POA: Diagnosis not present

## 2019-03-29 DIAGNOSIS — Z299 Encounter for prophylactic measures, unspecified: Secondary | ICD-10-CM | POA: Diagnosis not present

## 2019-03-29 DIAGNOSIS — Z6838 Body mass index (BMI) 38.0-38.9, adult: Secondary | ICD-10-CM | POA: Diagnosis not present

## 2019-03-29 DIAGNOSIS — N189 Chronic kidney disease, unspecified: Secondary | ICD-10-CM | POA: Diagnosis not present

## 2019-03-29 DIAGNOSIS — I2510 Atherosclerotic heart disease of native coronary artery without angina pectoris: Secondary | ICD-10-CM | POA: Diagnosis not present

## 2019-05-11 ENCOUNTER — Other Ambulatory Visit: Payer: Self-pay | Admitting: Student

## 2019-05-11 NOTE — Telephone Encounter (Signed)
This is a [facility] pt.  °

## 2019-05-13 IMAGING — DX CHEST  1 VIEW
1 series · 1 of 1 positions shown · non-contrast
Comparison: 09/18/2018

CLINICAL DATA: Unwitnessed fall.

EXAM:
CHEST  1 VIEW

[chest ap]
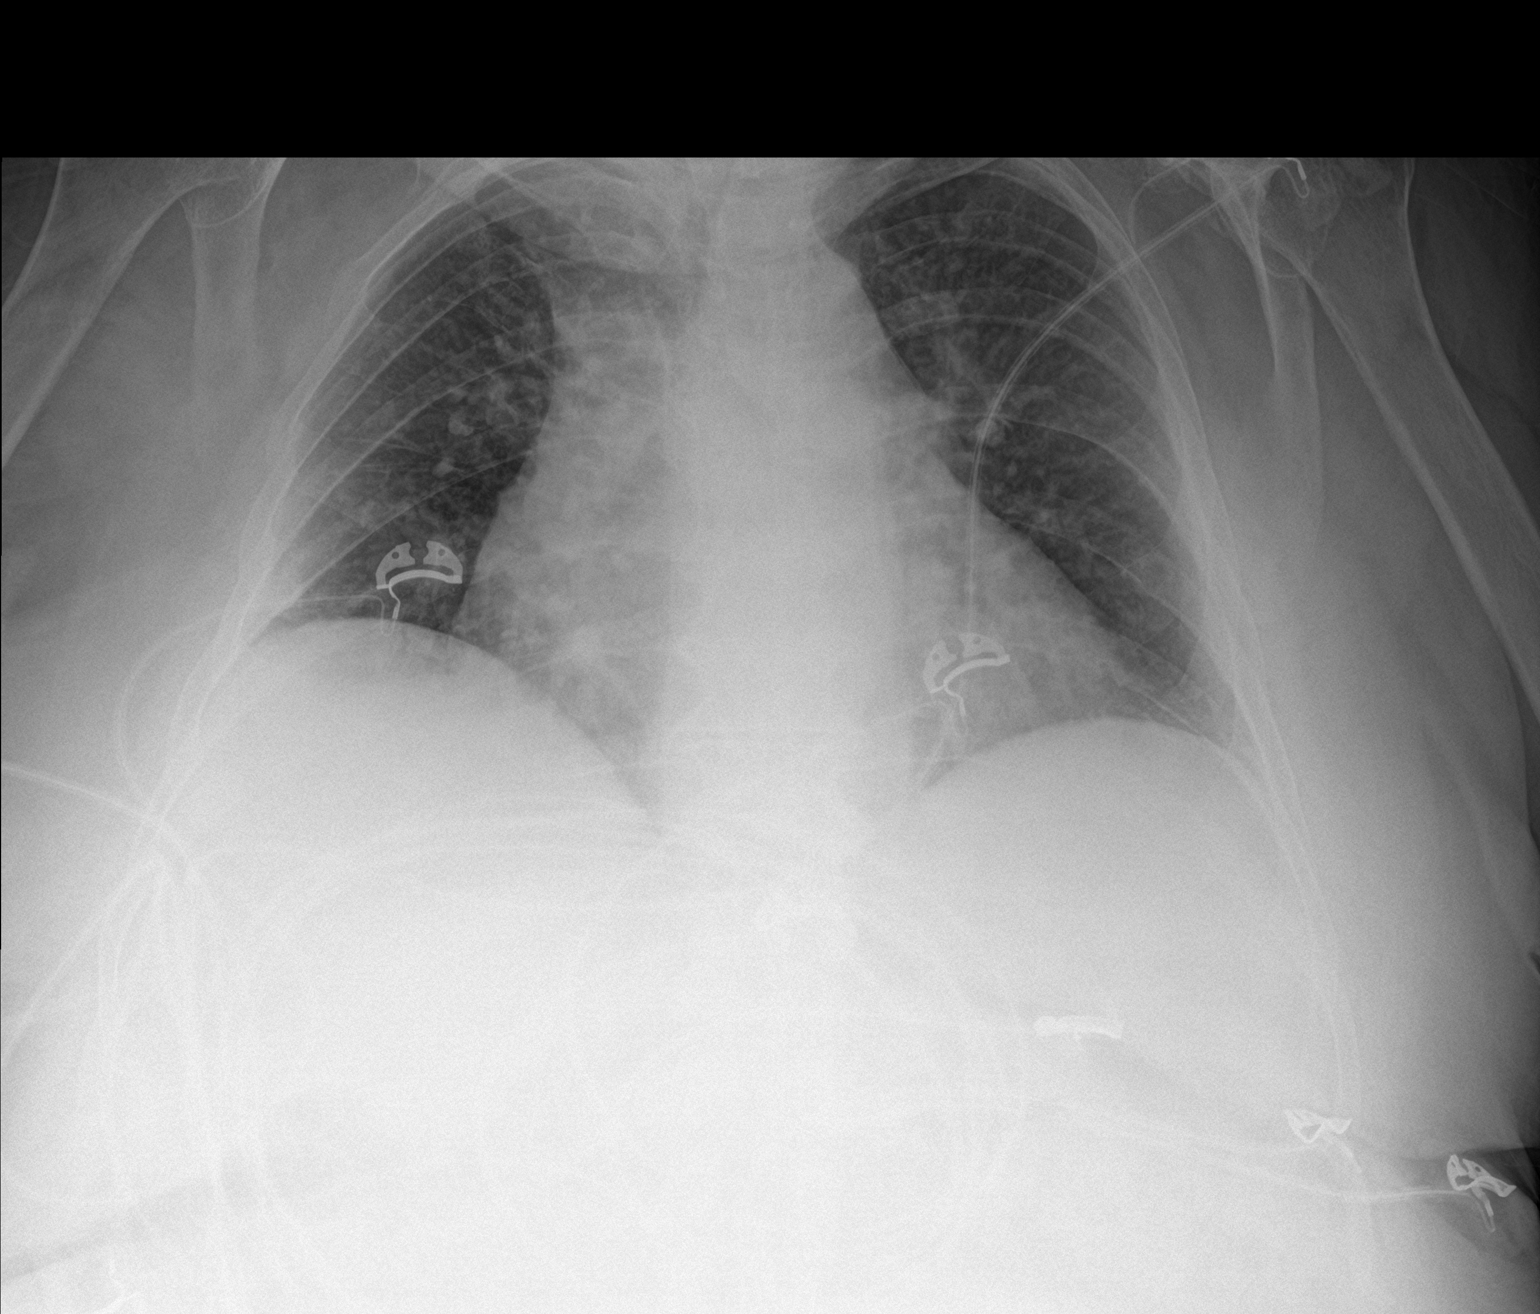

[1 of 1 positions shown; findings below may reference images not displayed]

FINDINGS: The patient is mildly rotated to the right. The cardiac silhouette
appears mildly enlarged although it is accentuated by AP technique
and low lung volumes. No airspace consolidation, edema, pleural
effusion, pneumothorax is identified. No acute osseous abnormality
is seen.
IMPRESSION: Low lung volumes without evidence of acute airspace disease.

## 2019-05-13 IMAGING — CT CT CERVICAL SPINE WITHOUT CONTRAST
3 of 6 series · 14 of 33 positions shown, 16 images · non-contrast
Comparison: Head CT a 09/15/2018.  MRI of the brain 09/15/2018.

CLINICAL DATA: 68-year-old female with history of unwitnessed fall.
Bruising in the left arm, left hand and chin. Altered consciousness.

EXAM:
CT HEAD WITHOUT CONTRAST
CT CERVICAL SPINE WITHOUT CONTRAST
TECHNIQUE: Multidetector CT imaging of the head and cervical spine was
performed following the standard protocol without intravenous
contrast. Multiplanar CT image reconstructions of the cervical spine
were also generated.

[Series 8: c spine soft · axial · 0.36mm/px · z∈[-132,+6]mm · 8 of 89 slices shown, 10 images]
[im 10/89  soft-tissue]
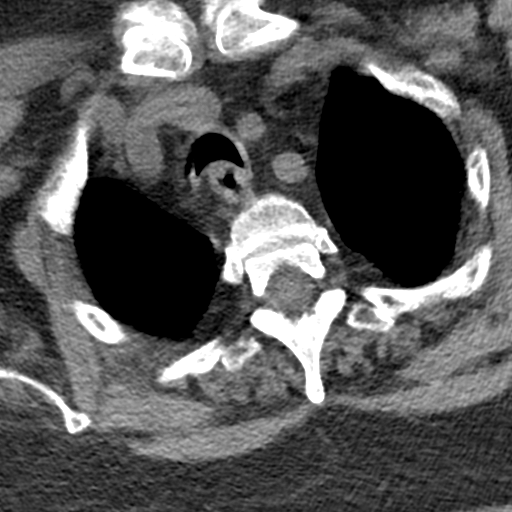
[im 10/89  bone]
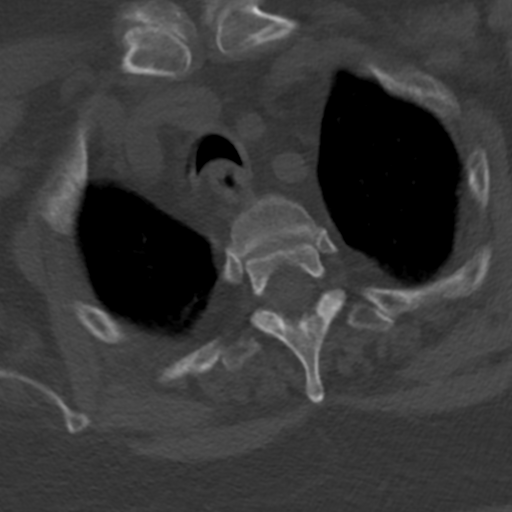
[im 20/89  bone]
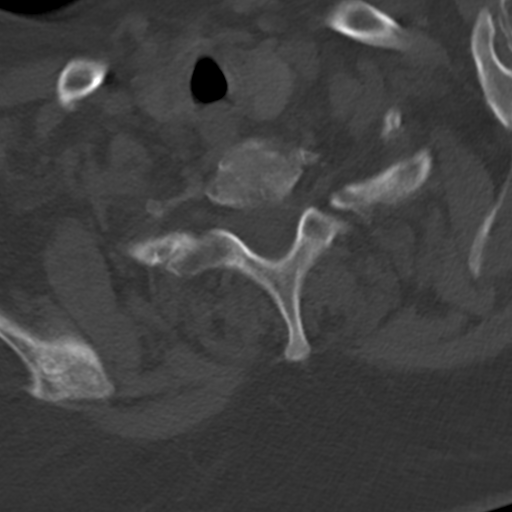
[im 30/89  bone]
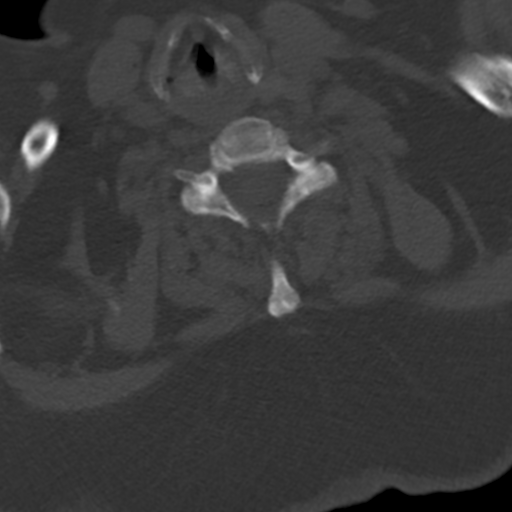
[im 40/89  bone]
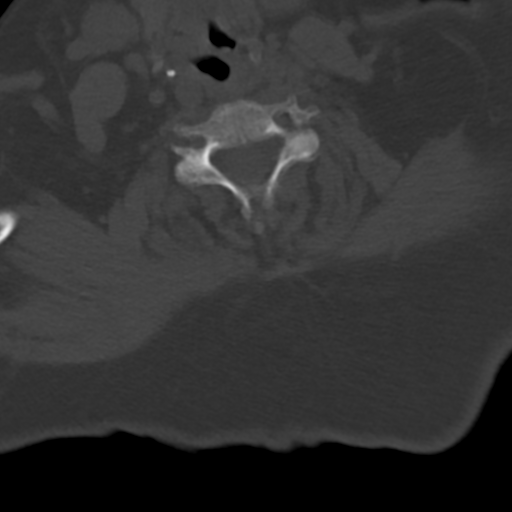
[im 49/89  soft-tissue]
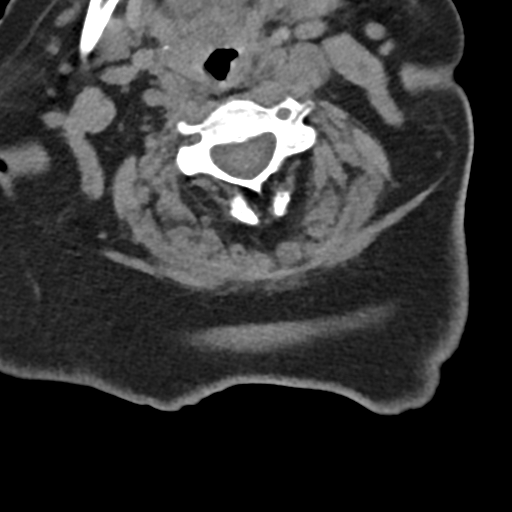
[im 49/89  bone]
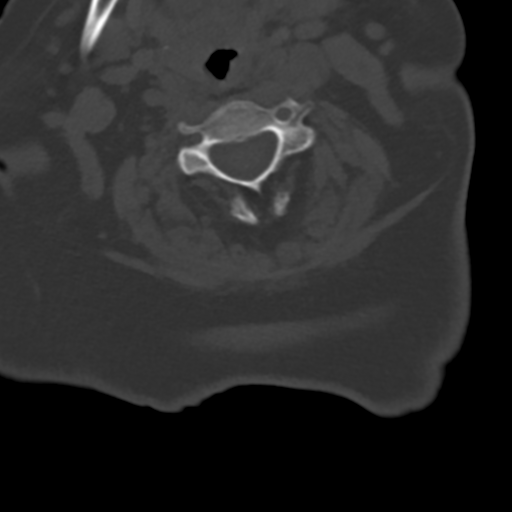
[im 59/89  bone]
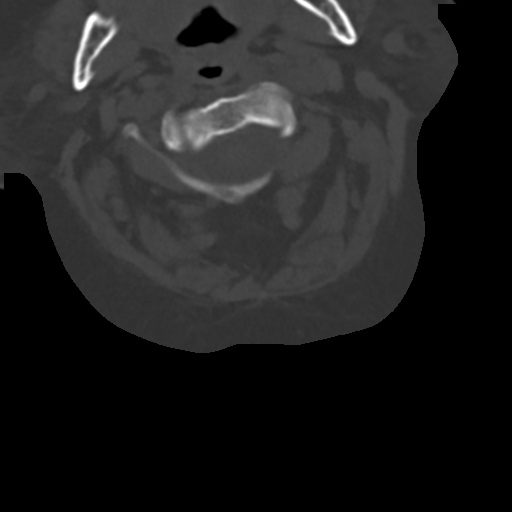
[im 69/89  bone]
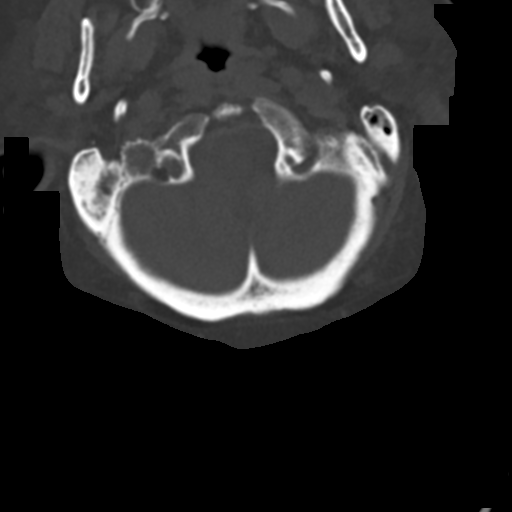
[im 79/89  bone]
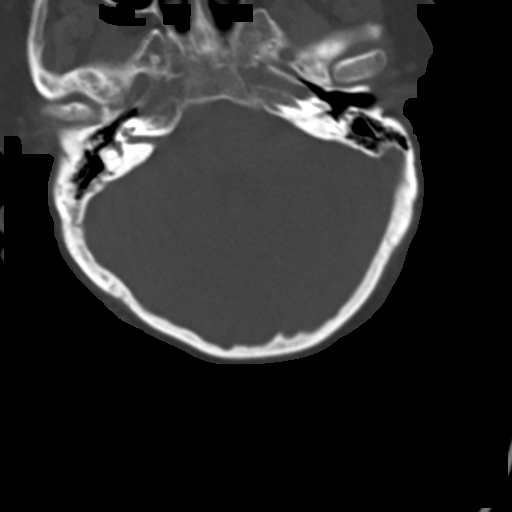

[Series 9: sagittal bone · sagittal · 0.37mm/px · 5 of 85 slices shown]
[im 15/85  bone]
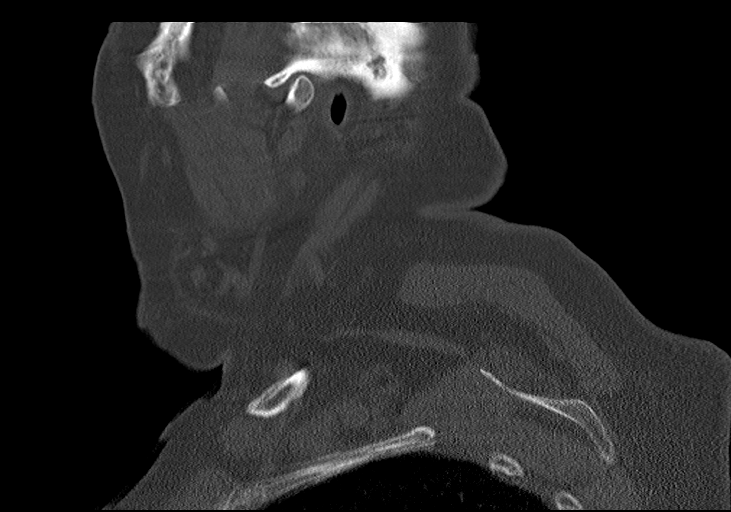
[im 29/85  bone]
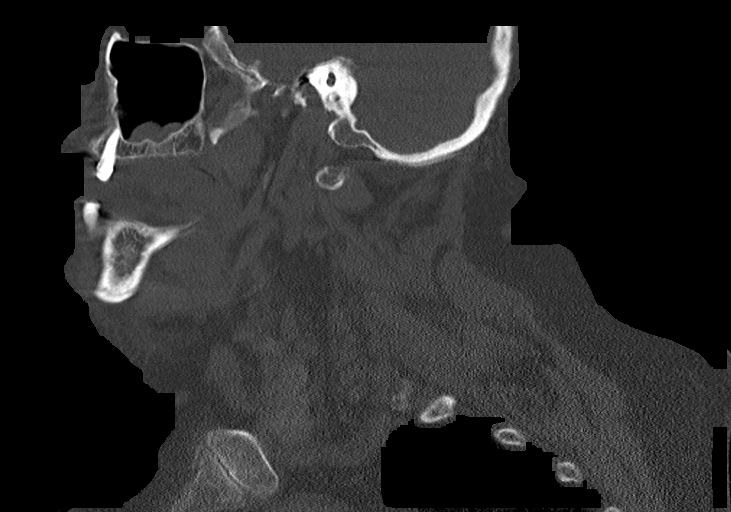
[im 43/85  bone]
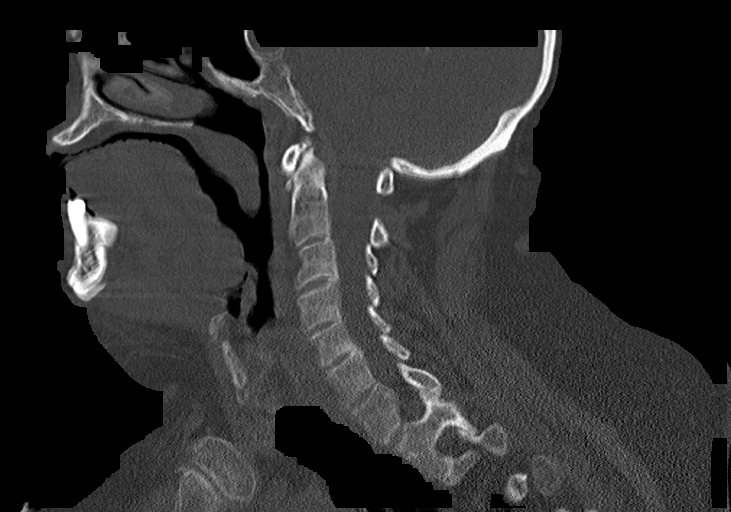
[im 57/85  bone]
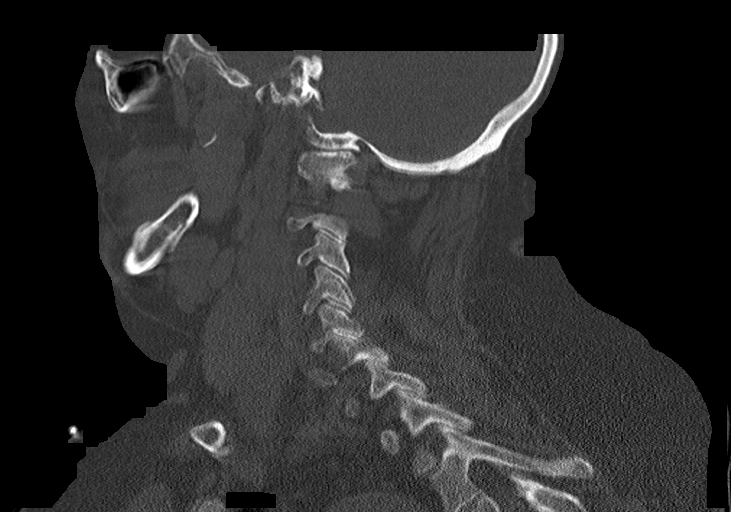
[im 71/85  bone]
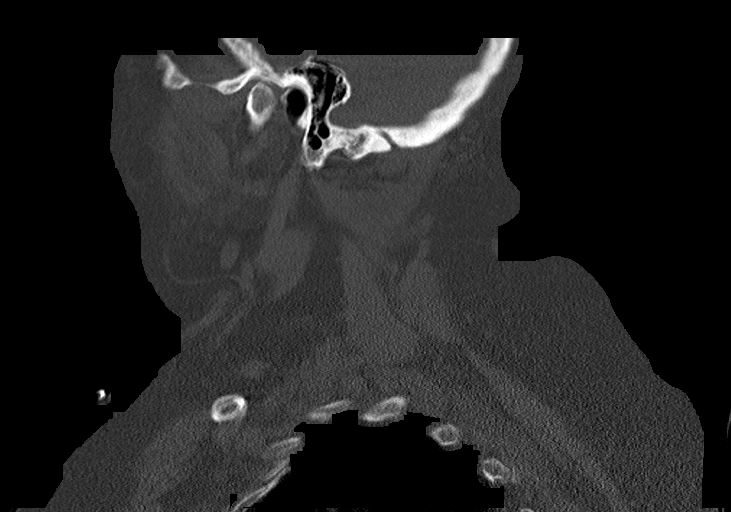

[Series 10: coronal bone · coronal · 0.30mm/px · 1 of 68 slices shown]
[im 34/68  bone]
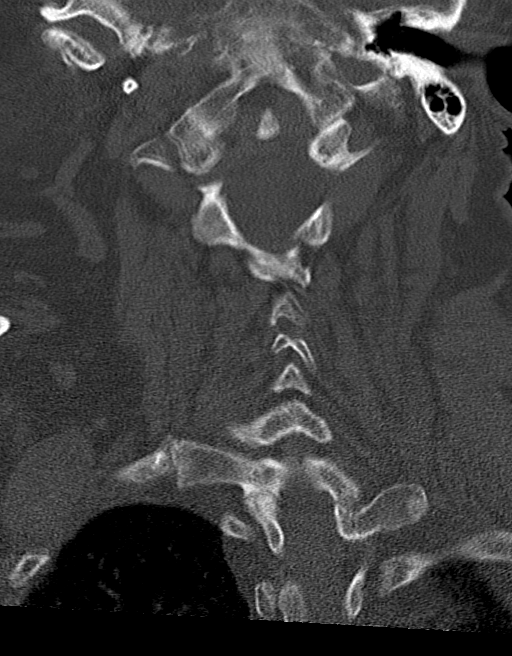

[14 of 33 positions shown; findings below may reference images not displayed]

FINDINGS: CT HEAD FINDINGS

Brain: Mild cerebral atrophy. Patchy areas of mild decreased
attenuation are noted throughout the deep and periventricular white
matter of the cerebral hemispheres bilaterally, compatible with mild
chronic microvascular ischemic disease. No evidence of acute
infarction, hemorrhage, hydrocephalus, extra-axial collection or
mass lesion/mass effect.

Vascular: No hyperdense vessel or unexpected calcification.

Skull: Normal. Negative for fracture or focal lesion.

Sinuses/Orbits: No acute finding. Multifocal mucosal thickening
throughout the maxillary sinuses bilaterally, as well as a 1.8 cm
low-attenuation lesion in the left maxillary sinus, compatible with
a small mucosal retention cyst or polyp.

Other: None.

CT CERVICAL SPINE FINDINGS

Alignment: Normal.

Skull base and vertebrae: No acute fracture. No primary bone lesion
or focal pathologic process.

Soft tissues and spinal canal: No prevertebral fluid or swelling. No
visible canal hematoma.

Disc levels: Mild multilevel degenerative disc disease, most
apparent at C5-C6 and C6-C7. Mild multilevel facet arthropathy.

Upper chest: Unremarkable.

Other: None.
IMPRESSION: 1. No no acute intracranial abnormality.
2. No findings to suggest significant acute traumatic injury to the
cervical spine.
3. Mild cerebral atrophy with mild chronic microvascular ischemic
changes in the cerebral white matter, similar to prior examinations.
4. Additional incidental findings, as above.

## 2019-05-13 IMAGING — DX LEFT HAND - COMPLETE 3+ VIEW
3 series · 3 of 3 positions shown · non-contrast
Comparison: 06/03/2018

CLINICAL DATA: 68-year-old female with a fall

EXAM:
LEFT HAND - COMPLETE 3+ VIEW

[hand pa]
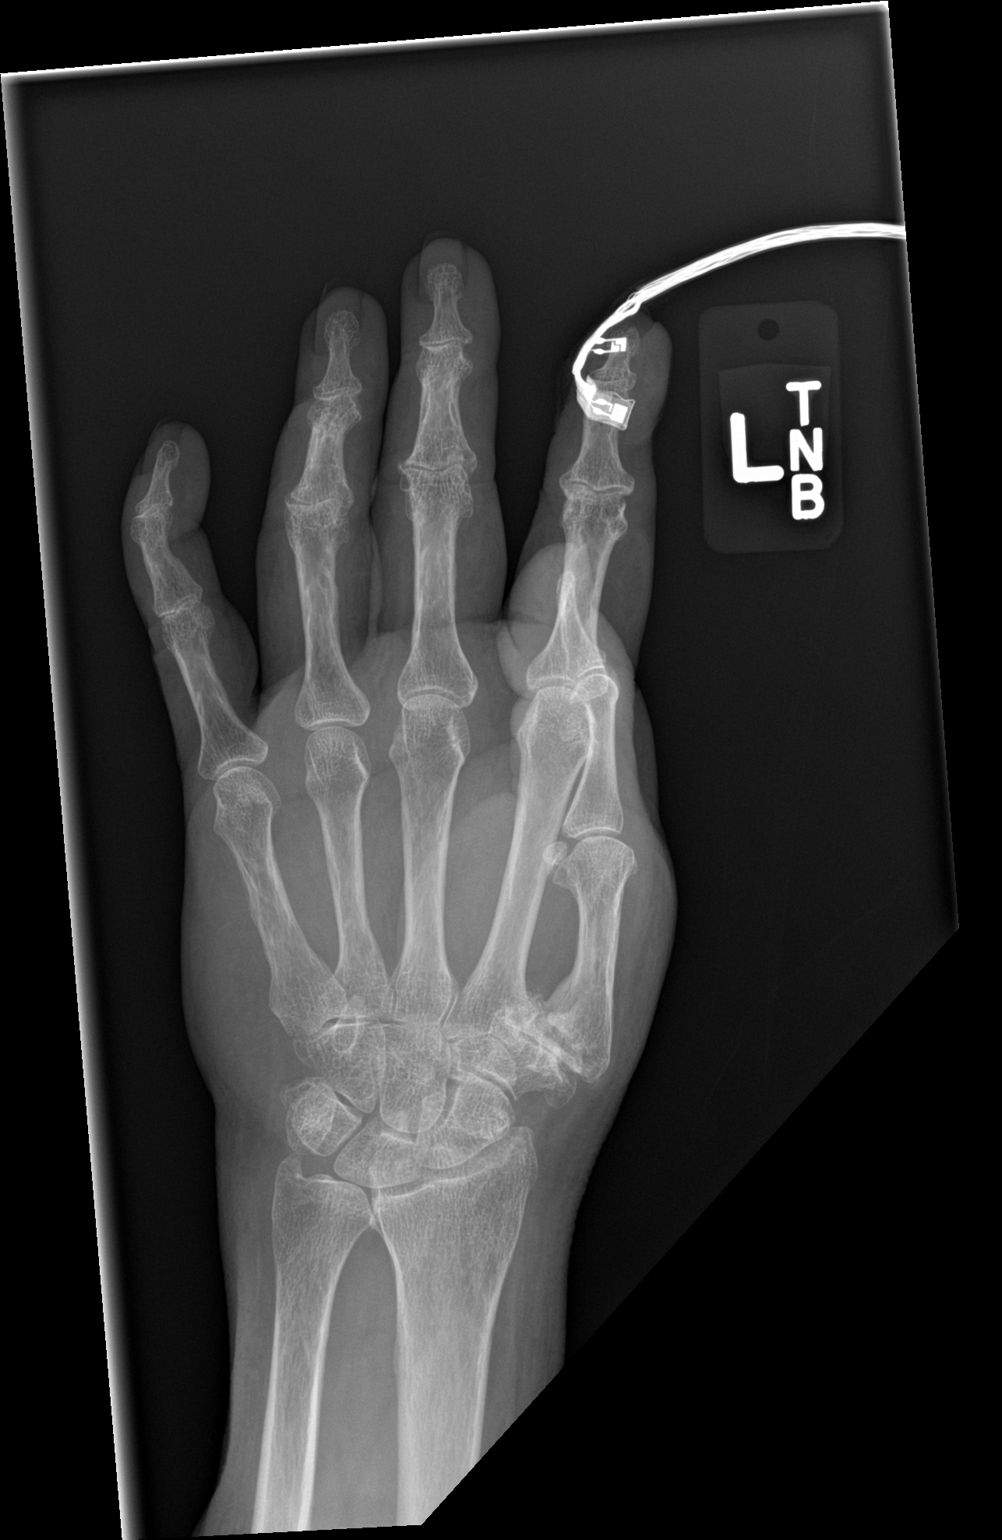

[hand obl]
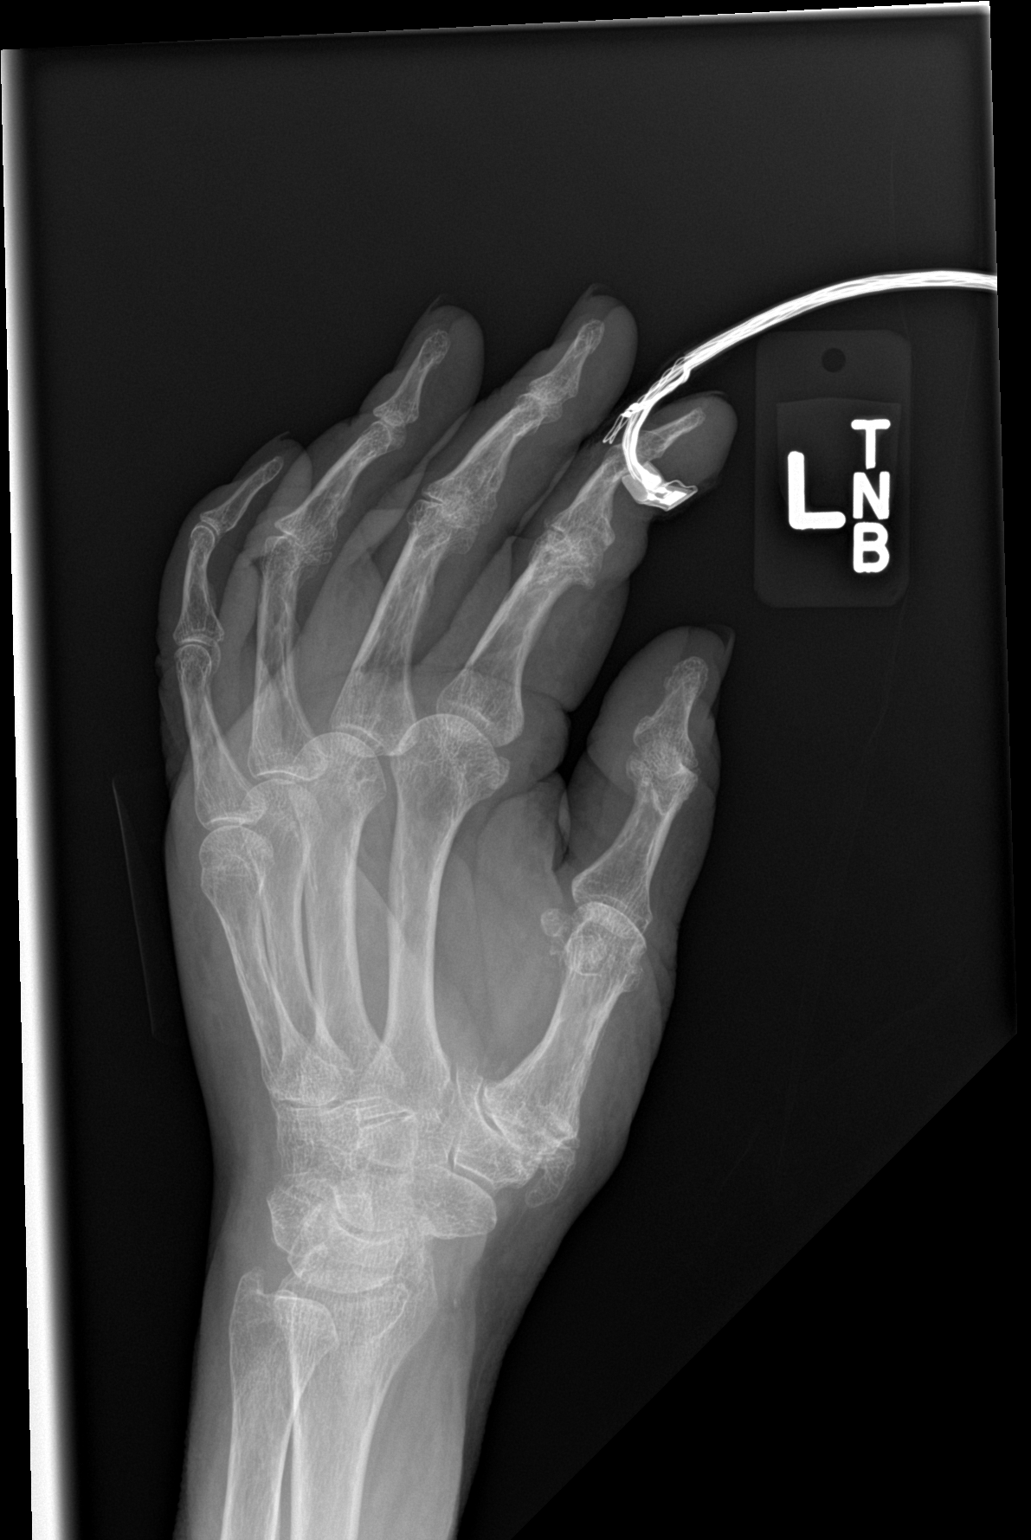

[hand lat]
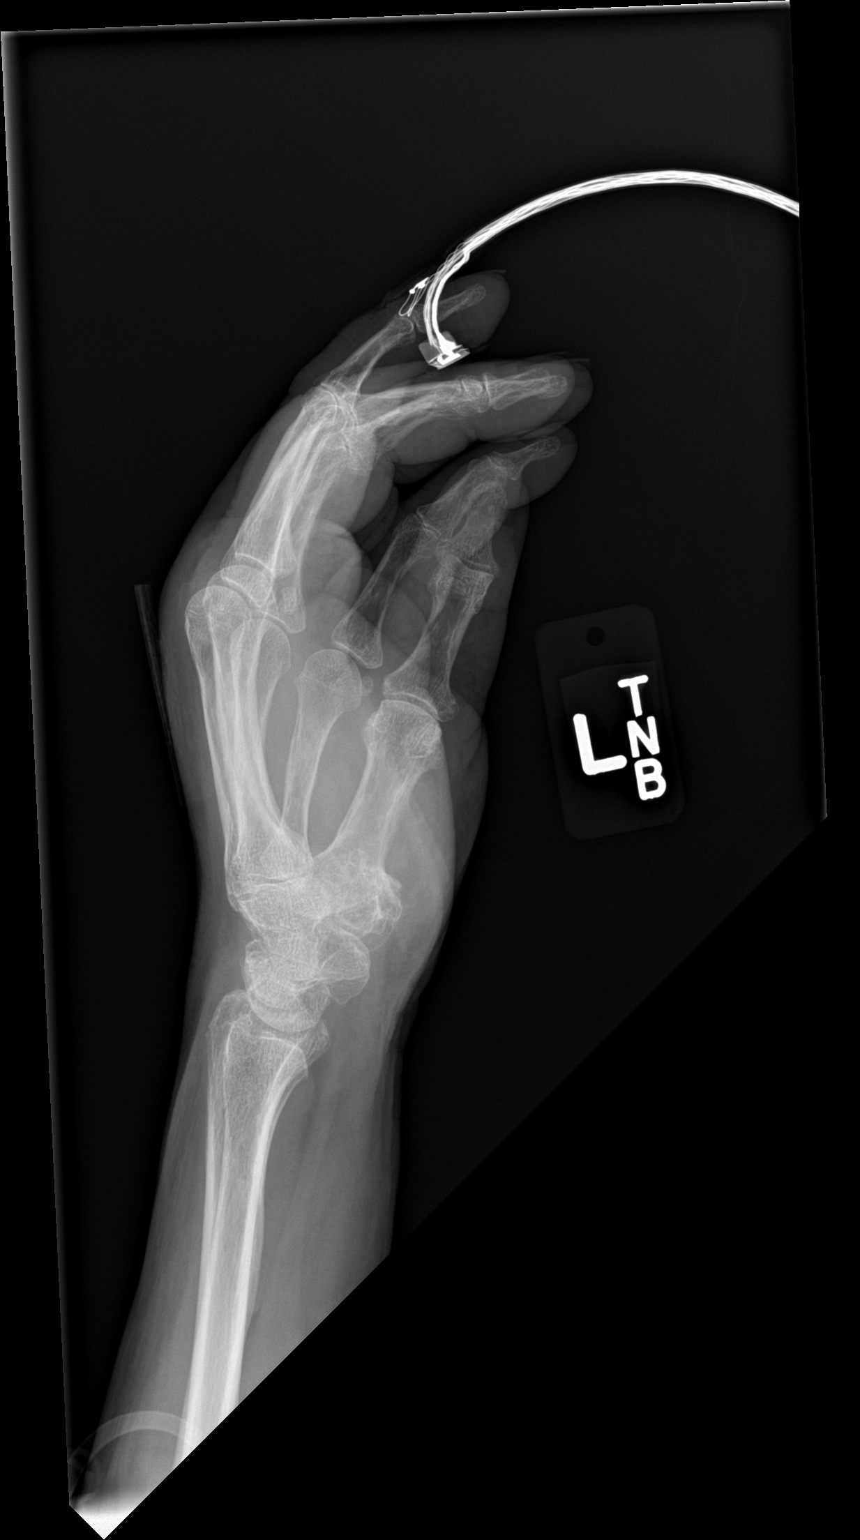

[3 of 3 positions shown; findings below may reference images not displayed]

FINDINGS: Osteopenia. No acute displaced fracture. No radiopaque foreign body.
Degenerative changes of the interphalangeal joints. No
subluxation/dislocation. No erosive changes. Degenerative changes of
the first carpometacarpal joint.
IMPRESSION: Osteopenia, with no acute bony abnormality identified.

Osteoarthritis

## 2019-06-04 DIAGNOSIS — E1165 Type 2 diabetes mellitus with hyperglycemia: Secondary | ICD-10-CM | POA: Diagnosis not present

## 2019-06-04 DIAGNOSIS — Z299 Encounter for prophylactic measures, unspecified: Secondary | ICD-10-CM | POA: Diagnosis not present

## 2019-06-04 DIAGNOSIS — M79672 Pain in left foot: Secondary | ICD-10-CM | POA: Diagnosis not present

## 2019-06-04 DIAGNOSIS — F209 Schizophrenia, unspecified: Secondary | ICD-10-CM | POA: Diagnosis not present

## 2019-06-04 DIAGNOSIS — E1140 Type 2 diabetes mellitus with diabetic neuropathy, unspecified: Secondary | ICD-10-CM | POA: Diagnosis not present

## 2019-06-04 DIAGNOSIS — L110 Acquired keratosis follicularis: Secondary | ICD-10-CM | POA: Diagnosis not present

## 2019-06-04 DIAGNOSIS — I2510 Atherosclerotic heart disease of native coronary artery without angina pectoris: Secondary | ICD-10-CM | POA: Diagnosis not present

## 2019-06-04 DIAGNOSIS — M79671 Pain in right foot: Secondary | ICD-10-CM | POA: Diagnosis not present

## 2019-06-06 ENCOUNTER — Encounter: Payer: Self-pay | Admitting: Cardiology

## 2019-06-06 ENCOUNTER — Telehealth (INDEPENDENT_AMBULATORY_CARE_PROVIDER_SITE_OTHER): Payer: Medicare Other | Admitting: Cardiology

## 2019-06-06 VITALS — BP 137/60 | HR 68 | Ht 63.0 in | Wt 199.0 lb

## 2019-06-06 DIAGNOSIS — E782 Mixed hyperlipidemia: Secondary | ICD-10-CM | POA: Diagnosis not present

## 2019-06-06 DIAGNOSIS — I5032 Chronic diastolic (congestive) heart failure: Secondary | ICD-10-CM

## 2019-06-06 DIAGNOSIS — I10 Essential (primary) hypertension: Secondary | ICD-10-CM

## 2019-06-06 DIAGNOSIS — N184 Chronic kidney disease, stage 4 (severe): Secondary | ICD-10-CM | POA: Diagnosis not present

## 2019-06-06 NOTE — Progress Notes (Signed)
Virtual Visit via Telephone Note   This visit type was conducted due to national recommendations for restrictions regarding the COVID-19 Pandemic (e.g. social distancing) in an effort to limit this patient's exposure and mitigate transmission in our community.  Due to her co-morbid illnesses, this patient is at least at moderate risk for complications without adequate follow up.  This format is felt to be most appropriate for this patient at this time.  The patient did not have access to video technology/had technical difficulties with video requiring transitioning to audio format only (telephone).  All issues noted in this document were discussed and addressed.  No physical exam could be performed with this format.  Please refer to the patient's chart for her  consent to telehealth for [facility].   Date:  06/06/2019   ID:  [name], DOB August 01, 1951, MRN [id number]  Patient Location: Home Provider Location: Office  PCP:  Glenda Chroman, MD Cardiologist:  Rozann Lesches, MD Electrophysiologist:  None   Evaluation Performed:  Follow-Up Visit  Chief Complaint:   Cardiac follow-up  History of Present Illness:    [name] is a 68 y.o. female last seen in November 2019 by Ms. Strader PA-C.  We spoke by phone today.  She is currently residing at [facility], an adult assisted living residence in [place].  I spoke with an assistant there.  Ms. Janikowski reportedly has been mobile, walking around without chest discomfort, NYHA class II symptoms based on description.  She tells me that she has a good appetite.  Recent reported weights since early October were 214, then 213, and 199 today.  She remains on diuretics in the form of Demadex and Aldactone.  Last lab work that I could find was back in April.  At that time her creatinine was 2.49 and potassium was 4.1.  PCP is Dr. Woody Seller in [place].  Reviewed office note from [name] back in August.  Patient has history of  schizoaffective disorder and also possible seizures versus catatonia.  The patient does not have symptoms concerning for COVID-19 infection (fever, chills, cough, or new shortness of breath).    Past Medical History:  Diagnosis Date  . Anxiety   . Asthma   . Back pain   . Barrett esophagus   . CHF (congestive heart failure) ([place])   . CKD (chronic kidney disease) stage 3, GFR 30-59 ml/min   . COPD (chronic obstructive pulmonary disease) ([place])   . Depression   . Diastolic heart failure ([place])   . Essential hypertension   . Fibromyalgia   . GERD (gastroesophageal reflux disease)   . Gout   . History of pericarditis   . Hyperlipidemia   . Hypothyroid   . Major depressive disorder   . Memory loss   . Nephrolithiasis    2008  . Obesity hypoventilation syndrome (H[facility])    CPAP  . Obstructive sleep apnea   . PE (pulmonary embolism)    2010  . Peripheral neuropathy   . PTSD (post-traumatic stress disorder)   . Rheumatoid arthritis ([place])   . Schizophrenia ([place])   . 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 Steroid dependence ([place])   . Type 2 diabetes mellitus ([place])   . Vitamin D deficiency    Past Surgical History:  Procedure Laterality Date  . APPENDECTOMY    . CHOLECYSTECTOMY    . COLONOSCOPY     benign polyps (12/2000), repeat colonoscopy performed in 2007  . ENDOMETRIAL BIOPSY  10/03 benign columnar mucosa (limited material)  . KIDNEY SURGERY       Current Meds  Medication Sig  . acetaminophen (TYLENOL) 325 MG tablet Take 325 mg by mouth 2 (two) times daily.   [name] Kitchen amLODipine (NORVASC) 5 MG tablet Take 1 tablet (5 mg total) by mouth daily. (Patient taking differently: Take 5 mg by mouth every morning. )  . aspirin 81 MG tablet Take 81 mg by mouth every morning.   [name] Kitchen atorvastatin (LIPITOR) 20 MG tablet Take 1 tablet by mouth daily.  . carvedilol (COREG) 6.25 MG tablet Take 1 tablet (6.25 mg total) by mouth 2 (two) times daily with a meal.  . Cholecalciferol (VITAMIN  D3) 50 MCG (2000 UT) TABS Take 4,000 Units by mouth every morning.   . docusate sodium (COLACE) 100 MG capsule Take 1 capsule (100 mg total) by mouth 2 (two) times daily. Hold for diarrhea (Patient taking differently: Take 100 mg by mouth daily as needed. Hold for diarrhea)  . Dulaglutide (TRULICITY) 1.5 0000000 SOPN Inject 1.5 mg into the skin every Friday.   . DULoxetine (CYMBALTA) 60 MG capsule Take 1 capsule by mouth daily.  [name] Kitchen EPINEPHrine (EPI-PEN) 0.3 mg/0.3 mL DEVI Inject 0.3 mg into the muscle once as needed (for bee stings, then go to the ED immediately after using).   . gabapentin (NEURONTIN) 300 MG capsule Take 1 capsule (300 mg total) by mouth 2 (two) times daily. (Patient taking differently: Take 300 mg by mouth 2 (two) times daily. & 600 mg at bedtime)  . hydrOXYzine (ATARAX/VISTARIL) 25 MG tablet Take 25 mg by mouth 2 (two) times daily as needed.  . insulin degludec (TRESIBA FLEXTOUCH) 100 UNIT/ML SOPN FlexTouch Pen Inject 18 Units into the skin at bedtime.   [name] Kitchen levothyroxine (SYNTHROID, LEVOTHROID) 25 MCG tablet Take 1 tablet (25 mcg total) by mouth daily at 6 (six) AM.  . linagliptin (TRADJENTA) 5 MG TABS tablet Take 5 mg by mouth every morning.   [name] Kitchen LORazepam (ATIVAN) 1 MG tablet Take 1 mg by mouth daily as needed for anxiety.  . montelukast (SINGULAIR) 10 MG tablet Take 10 mg by mouth at bedtime.  . Multiple Vitamin (DAILY VITE PO) Take 1 tablet by mouth every morning.   [name] Kitchen omeprazole (PRILOSEC) 20 MG capsule Take 20 mg by mouth every morning.   . prazosin (MINIPRESS) 2 MG capsule Take 1 capsule by mouth at bedtime.  . primidone (MYSOLINE) 50 MG tablet Take 50 mg by mouth every morning.  . pyridOXINE (VITAMIN B-6) 100 MG tablet Take 200 mg by mouth 2 (two) times daily.  . risperiDONE (RISPERDAL) 0.5 MG tablet Take 1 tablet by mouth 2 (two) times daily.  [name] Kitchen spironolactone (ALDACTONE) 25 MG tablet Take 1 tablet (25 mg total) by mouth daily. (Patient taking differently: Take 25 mg by  mouth every morning. )  . torsemide (DEMADEX) 20 MG tablet TAKE 3 TABLETS (60mg ) BY MOUTH EVERY OTHER DAY; ALTERNATING WITH 2 TABLETS (40mg ) BY MOUTH EVERY OTHER DAY.  . traMADol (ULTRAM) 50 MG tablet Take 1 tablet by mouth every 6 (six) hours as needed.  . traZODone (DESYREL) 50 MG tablet Take 1 tablet by mouth daily.  . Vitamins A & D (VITAMIN A & D) ointment Apply 1 application topically See admin instructions. To be applied after baths in the morning due to rash under skin folds     Allergies:   Benztropine mesylate, Codeine, Diazepam, Diphenhydramine hcl, Penicillins, Sulfonamide derivatives, Thioridazine hcl, and Lisinopril  Social History   Tobacco Use  . Smoking status: Former Smoker    Types: Cigarettes    Quit date: 06/23/1972    Years since quitting: 46.9  . Smokeless tobacco: Never Used  Substance Use Topics  . Alcohol use: No  . Drug use: No     Family Hx: The patient's family history includes Bone cancer in her mother; COPD in her father; Heart disease in her father and mother; Hypertension in her mother; Stroke in her father.  ROS:   Please see the history of present illness. All other systems reviewed and are negative.   Prior CV studies:   The following studies were reviewed today:  Echocardiogram 09/15/2018:  1. The left ventricle has normal systolic function of 123456. The cavity size is normal. There is concentric left ventricular wall thickness. Echo evidence of impaired relaxation diastolic filling patterns. Normal left ventricular filling pressures.  2. Severely dilated left atrial size.  3. Normal right atrial size.  4. Normal tricuspid valve.  5. Tricuspid regurgitation is mild.  6. The aortic valve tricuspid. There is mild thickening of the aortic valve.  7. Mild aortic annular calcification.  8. The aortic root is normal is size and structure.  9. No atrial level shunt detected by color flow Doppler.  Carotid Dopplers 09/15/2018: IMPRESSION:  Minor carotid atherosclerosis. No hemodynamically significant ICA stenosis. Degree of narrowing less than 50% bilaterally by ultrasound criteria.  Patent antegrade vertebral flow bilaterally  Labs/Other Tests and Data Reviewed:    EKG:  An ECG dated 11/28/2018 was personally reviewed today and demonstrated:  Sinus rhythm with right bundle branch block and left anterior fascicular block, LVH.  Recent Labs: 08/15/2018: B Natriuretic Peptide 100.0 10/09/2018: Magnesium 2.0 11/28/2018: ALT 23; BUN 41; Creatinine, Ser 2.49; Hemoglobin 9.0; Platelets 219; Potassium 4.1; Sodium 139; TSH 2.950   Recent Lipid Panel Lab Results  Component Value Date/Time   CHOL 160 09/16/2018 02:06 AM   TRIG 255 (H) 09/16/2018 02:06 AM   HDL 31 (L) 09/16/2018 02:06 AM   CHOLHDL 5.2 09/16/2018 02:06 AM   LDLCALC 78 09/16/2018 02:06 AM   LDLDIRECT 96 02/28/2008 08:44 PM    Wt Readings from Last 3 Encounters:  06/06/19 199 lb (90.3 kg)  11/28/18 211 lb 13.8 oz (96.1 kg)  10/19/18 215 lb (97.5 kg)     Objective:    Vital Signs:  BP 137/60   Pulse 68   Ht 5\' 3"  (1.6 m)   Wt 199 lb (90.3 kg)   BMI 35.25 kg/m    Patient spoke in short sentences on the phone. No audible wheezing or coughing.  Not obviously short of breath while talking.  ASSESSMENT & PLAN:    1.  Diastolic heart failure.  Echocardiogram from January of this year revealed LVEF 60 to 65%.  Recent reported weights have downward trend and she reports ambulating at her current residence with NYHA class II dyspnea.  She has been on Demadex and Aldactone.  Plan to check BMET to follow-up on renal function, Aldactone may need to be discontinued.  2.  CKD stage IV, creatinine was 2.49 as of April.  Follow-up BMET to be obtained.  PCP is Dr. Woody Seller.  3.  Essential hypertension, today's reported systolic blood pressures in the 130s.  She is currently on Norvasc, prazosin and Coreg.  No changes made today.  4.  Mixed hyperlipidemia on Lipitor with  follow-up per PCP.  COVID-19 Education: The signs and symptoms of COVID-19 were discussed  with the patient and how to seek care for testing (follow up with PCP or arrange E-visit).  The importance of social distancing was discussed today.  Time:   Today, I have spent 12 minutes with the patient with telehealth technology discussing the above problems.     Medication Adjustments/Labs and Tests Ordered: Current medicines are reviewed at length with the patient today.  Concerns regarding medicines are outlined above.   Tests Ordered: Orders Placed This Encounter  Procedures  . Basic metabolic panel    Medication Changes: No orders of the defined types were placed in this encounter.   Follow Up:  Virtual Visit  6 months.  Signed, Rozann Lesches, MD  06/06/2019 9:05 AM    [place]

## 2019-06-06 NOTE — Patient Instructions (Addendum)
Medication Instructions:   Your physician recommends that you continue on your current medications as directed. Please refer to the Current Medication list given to you today.  Labwork:  Your physician recommends that you return for non-fasting lab work as soon as possible to check your BMET. You may have this done at [facility]. No appointment is necessary. Monday-Friday from 8 am - 4 pm. Your lab order has been faxed.   Testing/Procedures:  NONE  Follow-Up:  Your physician recommends that you schedule a follow-up appointment in: 6 months for a virtual visit. You will receive a reminder letter in the mail in about 4 months reminding you to call and schedule your appointment. If you don't receive this letter, please contact our office.  Any Other Special Instructions Will Be Listed Below (If Applicable).  If you need a refill on your cardiac medications before your next appointment, please call your pharmacy.

## 2019-06-11 ENCOUNTER — Other Ambulatory Visit (HOSPITAL_COMMUNITY)
Admission: RE | Admit: 2019-06-11 | Discharge: 2019-06-11 | Disposition: A | Discharge status: Home / Self Care | Payer: Medicare Other | Source: Ambulatory Visit | Attending: Cardiology | Admitting: Cardiology

## 2019-06-11 DIAGNOSIS — I10 Essential (primary) hypertension: Secondary | ICD-10-CM | POA: Diagnosis not present

## 2019-06-11 DIAGNOSIS — I5032 Chronic diastolic (congestive) heart failure: Secondary | ICD-10-CM | POA: Diagnosis not present

## 2019-06-11 DIAGNOSIS — E782 Mixed hyperlipidemia: Secondary | ICD-10-CM | POA: Diagnosis not present

## 2019-06-11 DIAGNOSIS — N184 Chronic kidney disease, stage 4 (severe): Secondary | ICD-10-CM | POA: Diagnosis not present

## 2019-06-11 LAB — BASIC METABOLIC PANEL
Anion gap: 11 (ref 5–15)
BUN: 48 mg/dL — ABNORMAL HIGH (ref 8–23)
CO2: 25 mmol/L (ref 22–32)
Calcium: 8.7 mg/dL — ABNORMAL LOW (ref 8.9–10.3)
Chloride: 106 mmol/L (ref 98–111)
Creatinine, Ser: 3.12 mg/dL — ABNORMAL HIGH (ref 0.44–1.00)
GFR calc Af Amer: 17 mL/min — ABNORMAL LOW (ref >60)
GFR calc non Af Amer: 15 mL/min — ABNORMAL LOW (ref >60)
Glucose, Bld: 147 mg/dL — ABNORMAL HIGH (ref 70–99)
Potassium: 3.8 mmol/L (ref 3.5–5.1)
Sodium: 142 mmol/L (ref 135–145)

## 2019-06-14 ENCOUNTER — Telehealth: Payer: Self-pay | Admitting: *Deleted

## 2019-06-14 NOTE — Addendum Note (Signed)
Addended by: Merlene Laughter on: 06/14/2019 04:47 PM   Modules accepted: Orders

## 2019-06-14 NOTE — Telephone Encounter (Signed)
-----   Message from Satira Sark, MD sent at 06/11/2019  1:43 PM EDT ----- Results reviewed.  Kidney function has worsened, creatinine up to 3.12 and potassium is normal.  Please have her stop Aldactone completely.  Also forward lab work to PCP Dr. Woody Seller.

## 2019-06-14 NOTE — Telephone Encounter (Signed)
[name], house supervisor and medication technichian informed and verbalized understanding. Copy sent to PCP & [place]

## 2019-06-26 DIAGNOSIS — H1030 Unspecified acute conjunctivitis, unspecified eye: Secondary | ICD-10-CM | POA: Diagnosis not present

## 2019-06-26 DIAGNOSIS — Z299 Encounter for prophylactic measures, unspecified: Secondary | ICD-10-CM | POA: Diagnosis not present

## 2019-06-26 DIAGNOSIS — E1122 Type 2 diabetes mellitus with diabetic chronic kidney disease: Secondary | ICD-10-CM | POA: Diagnosis not present

## 2019-06-26 DIAGNOSIS — E1165 Type 2 diabetes mellitus with hyperglycemia: Secondary | ICD-10-CM | POA: Diagnosis not present

## 2019-06-26 DIAGNOSIS — I10 Essential (primary) hypertension: Secondary | ICD-10-CM | POA: Diagnosis not present

## 2019-06-26 DIAGNOSIS — Z6837 Body mass index (BMI) 37.0-37.9, adult: Secondary | ICD-10-CM | POA: Diagnosis not present

## 2019-07-16 DIAGNOSIS — M79671 Pain in right foot: Secondary | ICD-10-CM | POA: Diagnosis not present

## 2019-07-16 DIAGNOSIS — M79672 Pain in left foot: Secondary | ICD-10-CM | POA: Diagnosis not present

## 2019-07-16 DIAGNOSIS — M25579 Pain in unspecified ankle and joints of unspecified foot: Secondary | ICD-10-CM | POA: Diagnosis not present

## 2019-09-05 ENCOUNTER — Encounter (HOSPITAL_COMMUNITY): Payer: Self-pay | Admitting: *Deleted

## 2019-09-05 ENCOUNTER — Other Ambulatory Visit: Payer: Self-pay

## 2019-09-05 DIAGNOSIS — E1142 Type 2 diabetes mellitus with diabetic polyneuropathy: Secondary | ICD-10-CM | POA: Diagnosis present

## 2019-09-05 DIAGNOSIS — R339 Retention of urine, unspecified: Secondary | ICD-10-CM | POA: Diagnosis not present

## 2019-09-05 DIAGNOSIS — M069 Rheumatoid arthritis, unspecified: Secondary | ICD-10-CM | POA: Diagnosis present

## 2019-09-05 DIAGNOSIS — Z6841 Body Mass Index (BMI) 40.0 and over, adult: Secondary | ICD-10-CM | POA: Diagnosis not present

## 2019-09-05 DIAGNOSIS — N2581 Secondary hyperparathyroidism of renal origin: Secondary | ICD-10-CM | POA: Diagnosis not present

## 2019-09-05 DIAGNOSIS — Z885 Allergy status to narcotic agent status: Secondary | ICD-10-CM

## 2019-09-05 DIAGNOSIS — Z794 Long term (current) use of insulin: Secondary | ICD-10-CM

## 2019-09-05 DIAGNOSIS — G4700 Insomnia, unspecified: Secondary | ICD-10-CM | POA: Diagnosis present

## 2019-09-05 DIAGNOSIS — N179 Acute kidney failure, unspecified: Secondary | ICD-10-CM | POA: Diagnosis not present

## 2019-09-05 DIAGNOSIS — I110 Hypertensive heart disease with heart failure: Secondary | ICD-10-CM | POA: Diagnosis present

## 2019-09-05 DIAGNOSIS — Z20822 Contact with and (suspected) exposure to covid-19: Secondary | ICD-10-CM | POA: Diagnosis not present

## 2019-09-05 DIAGNOSIS — E039 Hypothyroidism, unspecified: Secondary | ICD-10-CM | POA: Diagnosis present

## 2019-09-05 DIAGNOSIS — F329 Major depressive disorder, single episode, unspecified: Secondary | ICD-10-CM | POA: Diagnosis present

## 2019-09-05 DIAGNOSIS — E440 Moderate protein-calorie malnutrition: Secondary | ICD-10-CM | POA: Diagnosis not present

## 2019-09-05 DIAGNOSIS — I480 Paroxysmal atrial fibrillation: Secondary | ICD-10-CM | POA: Diagnosis not present

## 2019-09-05 DIAGNOSIS — Z882 Allergy status to sulfonamides status: Secondary | ICD-10-CM

## 2019-09-05 DIAGNOSIS — I959 Hypotension, unspecified: Secondary | ICD-10-CM | POA: Diagnosis not present

## 2019-09-05 DIAGNOSIS — D696 Thrombocytopenia, unspecified: Secondary | ICD-10-CM | POA: Diagnosis not present

## 2019-09-05 DIAGNOSIS — Z538 Procedure and treatment not carried out for other reasons: Secondary | ICD-10-CM | POA: Diagnosis not present

## 2019-09-05 DIAGNOSIS — E785 Hyperlipidemia, unspecified: Secondary | ICD-10-CM | POA: Diagnosis present

## 2019-09-05 DIAGNOSIS — I471 Supraventricular tachycardia: Secondary | ICD-10-CM | POA: Diagnosis not present

## 2019-09-05 DIAGNOSIS — E662 Morbid (severe) obesity with alveolar hypoventilation: Secondary | ICD-10-CM | POA: Diagnosis not present

## 2019-09-05 DIAGNOSIS — J4540 Moderate persistent asthma, uncomplicated: Secondary | ICD-10-CM | POA: Diagnosis present

## 2019-09-05 DIAGNOSIS — J9602 Acute respiratory failure with hypercapnia: Secondary | ICD-10-CM | POA: Diagnosis not present

## 2019-09-05 DIAGNOSIS — Z823 Family history of stroke: Secondary | ICD-10-CM

## 2019-09-05 DIAGNOSIS — Z8249 Family history of ischemic heart disease and other diseases of the circulatory system: Secondary | ICD-10-CM

## 2019-09-05 DIAGNOSIS — J9601 Acute respiratory failure with hypoxia: Secondary | ICD-10-CM | POA: Diagnosis not present

## 2019-09-05 DIAGNOSIS — Z7982 Long term (current) use of aspirin: Secondary | ICD-10-CM

## 2019-09-05 DIAGNOSIS — Z808 Family history of malignant neoplasm of other organs or systems: Secondary | ICD-10-CM

## 2019-09-05 DIAGNOSIS — G9341 Metabolic encephalopathy: Secondary | ICD-10-CM | POA: Diagnosis not present

## 2019-09-05 DIAGNOSIS — F4310 Post-traumatic stress disorder, unspecified: Secondary | ICD-10-CM | POA: Diagnosis present

## 2019-09-05 DIAGNOSIS — J440 Chronic obstructive pulmonary disease with acute lower respiratory infection: Secondary | ICD-10-CM | POA: Diagnosis not present

## 2019-09-05 DIAGNOSIS — N186 End stage renal disease: Secondary | ICD-10-CM | POA: Diagnosis present

## 2019-09-05 DIAGNOSIS — Y95 Nosocomial condition: Secondary | ICD-10-CM | POA: Diagnosis not present

## 2019-09-05 DIAGNOSIS — Z888 Allergy status to other drugs, medicaments and biological substances status: Secondary | ICD-10-CM

## 2019-09-05 DIAGNOSIS — Z7952 Long term (current) use of systemic steroids: Secondary | ICD-10-CM

## 2019-09-05 DIAGNOSIS — Z79890 Hormone replacement therapy (postmenopausal): Secondary | ICD-10-CM

## 2019-09-05 DIAGNOSIS — R29734 NIHSS score 34: Secondary | ICD-10-CM | POA: Diagnosis not present

## 2019-09-05 DIAGNOSIS — Z880 Allergy status to penicillin: Secondary | ICD-10-CM

## 2019-09-05 DIAGNOSIS — R0602 Shortness of breath: Secondary | ICD-10-CM | POA: Diagnosis present

## 2019-09-05 DIAGNOSIS — J189 Pneumonia, unspecified organism: Secondary | ICD-10-CM | POA: Diagnosis not present

## 2019-09-05 DIAGNOSIS — E1122 Type 2 diabetes mellitus with diabetic chronic kidney disease: Secondary | ICD-10-CM | POA: Diagnosis not present

## 2019-09-05 DIAGNOSIS — Z9049 Acquired absence of other specified parts of digestive tract: Secondary | ICD-10-CM

## 2019-09-05 DIAGNOSIS — Z825 Family history of asthma and other chronic lower respiratory diseases: Secondary | ICD-10-CM

## 2019-09-05 DIAGNOSIS — Z87442 Personal history of urinary calculi: Secondary | ICD-10-CM

## 2019-09-05 DIAGNOSIS — Z79899 Other long term (current) drug therapy: Secondary | ICD-10-CM

## 2019-09-05 DIAGNOSIS — E872 Acidosis: Secondary | ICD-10-CM | POA: Diagnosis not present

## 2019-09-05 DIAGNOSIS — F419 Anxiety disorder, unspecified: Secondary | ICD-10-CM | POA: Diagnosis present

## 2019-09-05 DIAGNOSIS — F202 Catatonic schizophrenia: Secondary | ICD-10-CM | POA: Diagnosis not present

## 2019-09-05 DIAGNOSIS — I452 Bifascicular block: Secondary | ICD-10-CM | POA: Diagnosis not present

## 2019-09-05 DIAGNOSIS — E8779 Other fluid overload: Secondary | ICD-10-CM | POA: Diagnosis not present

## 2019-09-05 DIAGNOSIS — D631 Anemia in chronic kidney disease: Secondary | ICD-10-CM | POA: Diagnosis present

## 2019-09-05 DIAGNOSIS — Z6837 Body mass index (BMI) 37.0-37.9, adult: Secondary | ICD-10-CM

## 2019-09-05 DIAGNOSIS — Z992 Dependence on renal dialysis: Secondary | ICD-10-CM

## 2019-09-05 DIAGNOSIS — R451 Restlessness and agitation: Secondary | ICD-10-CM | POA: Diagnosis not present

## 2019-09-05 DIAGNOSIS — Z86711 Personal history of pulmonary embolism: Secondary | ICD-10-CM

## 2019-09-05 DIAGNOSIS — I5033 Acute on chronic diastolic (congestive) heart failure: Secondary | ICD-10-CM | POA: Diagnosis not present

## 2019-09-05 DIAGNOSIS — M797 Fibromyalgia: Secondary | ICD-10-CM | POA: Diagnosis present

## 2019-09-05 DIAGNOSIS — Z87891 Personal history of nicotine dependence: Secondary | ICD-10-CM

## 2019-09-05 DIAGNOSIS — Z79891 Long term (current) use of opiate analgesic: Secondary | ICD-10-CM

## 2019-09-05 DIAGNOSIS — E1165 Type 2 diabetes mellitus with hyperglycemia: Secondary | ICD-10-CM | POA: Diagnosis not present

## 2019-09-05 DIAGNOSIS — K219 Gastro-esophageal reflux disease without esophagitis: Secondary | ICD-10-CM | POA: Diagnosis present

## 2019-09-05 DIAGNOSIS — R471 Dysarthria and anarthria: Secondary | ICD-10-CM | POA: Diagnosis not present

## 2019-09-05 DIAGNOSIS — K5900 Constipation, unspecified: Secondary | ICD-10-CM | POA: Diagnosis not present

## 2019-09-05 DIAGNOSIS — R29810 Facial weakness: Secondary | ICD-10-CM | POA: Diagnosis not present

## 2019-09-05 DIAGNOSIS — Z8679 Personal history of other diseases of the circulatory system: Secondary | ICD-10-CM

## 2019-09-05 NOTE — ED Triage Notes (Signed)
[facility] reports pt here for SOB, per pt she states she passed out.  RA sats 93-95% in triage.  Pt denies wearing oxygen at home.

## 2019-09-06 ENCOUNTER — Inpatient Hospital Stay (HOSPITAL_COMMUNITY)
Admission: EM | Admit: 2019-09-06 | Discharge: 2019-10-01 | DRG: 628 | Disposition: A | Discharge status: Skilled Nursing Facility | Payer: Medicare Other | Source: Skilled Nursing Facility | Attending: Internal Medicine | Admitting: Internal Medicine

## 2019-09-06 ENCOUNTER — Observation Stay (HOSPITAL_COMMUNITY): Payer: Medicare Other

## 2019-09-06 ENCOUNTER — Emergency Department (HOSPITAL_COMMUNITY): Payer: Medicare Other

## 2019-09-06 DIAGNOSIS — N184 Chronic kidney disease, stage 4 (severe): Secondary | ICD-10-CM | POA: Diagnosis present

## 2019-09-06 DIAGNOSIS — N189 Chronic kidney disease, unspecified: Secondary | ICD-10-CM | POA: Diagnosis present

## 2019-09-06 DIAGNOSIS — N179 Acute kidney failure, unspecified: Secondary | ICD-10-CM | POA: Diagnosis present

## 2019-09-06 DIAGNOSIS — E66813 Obesity, class 3: Secondary | ICD-10-CM | POA: Diagnosis present

## 2019-09-06 DIAGNOSIS — I5032 Chronic diastolic (congestive) heart failure: Secondary | ICD-10-CM | POA: Diagnosis present

## 2019-09-06 DIAGNOSIS — Z419 Encounter for procedure for purposes other than remedying health state, unspecified: Secondary | ICD-10-CM

## 2019-09-06 DIAGNOSIS — J9602 Acute respiratory failure with hypercapnia: Secondary | ICD-10-CM | POA: Diagnosis not present

## 2019-09-06 DIAGNOSIS — Z9889 Other specified postprocedural states: Secondary | ICD-10-CM

## 2019-09-06 DIAGNOSIS — F209 Schizophrenia, unspecified: Secondary | ICD-10-CM | POA: Diagnosis present

## 2019-09-06 DIAGNOSIS — E039 Hypothyroidism, unspecified: Secondary | ICD-10-CM | POA: Diagnosis present

## 2019-09-06 DIAGNOSIS — D631 Anemia in chronic kidney disease: Secondary | ICD-10-CM | POA: Diagnosis present

## 2019-09-06 DIAGNOSIS — I10 Essential (primary) hypertension: Secondary | ICD-10-CM | POA: Diagnosis present

## 2019-09-06 DIAGNOSIS — Z6841 Body Mass Index (BMI) 40.0 and over, adult: Secondary | ICD-10-CM | POA: Diagnosis not present

## 2019-09-06 DIAGNOSIS — N186 End stage renal disease: Secondary | ICD-10-CM | POA: Diagnosis present

## 2019-09-06 DIAGNOSIS — F202 Catatonic schizophrenia: Secondary | ICD-10-CM | POA: Diagnosis present

## 2019-09-06 DIAGNOSIS — J9601 Acute respiratory failure with hypoxia: Secondary | ICD-10-CM | POA: Diagnosis not present

## 2019-09-06 DIAGNOSIS — E1122 Type 2 diabetes mellitus with diabetic chronic kidney disease: Secondary | ICD-10-CM | POA: Diagnosis present

## 2019-09-06 DIAGNOSIS — Z992 Dependence on renal dialysis: Secondary | ICD-10-CM

## 2019-09-06 DIAGNOSIS — F329 Major depressive disorder, single episode, unspecified: Secondary | ICD-10-CM | POA: Diagnosis present

## 2019-09-06 DIAGNOSIS — J440 Chronic obstructive pulmonary disease with acute lower respiratory infection: Secondary | ICD-10-CM | POA: Diagnosis not present

## 2019-09-06 DIAGNOSIS — N2581 Secondary hyperparathyroidism of renal origin: Secondary | ICD-10-CM | POA: Diagnosis present

## 2019-09-06 DIAGNOSIS — D649 Anemia, unspecified: Secondary | ICD-10-CM | POA: Diagnosis present

## 2019-09-06 DIAGNOSIS — I471 Supraventricular tachycardia, unspecified: Secondary | ICD-10-CM | POA: Diagnosis present

## 2019-09-06 DIAGNOSIS — E440 Moderate protein-calorie malnutrition: Secondary | ICD-10-CM | POA: Diagnosis not present

## 2019-09-06 DIAGNOSIS — E1142 Type 2 diabetes mellitus with diabetic polyneuropathy: Secondary | ICD-10-CM | POA: Diagnosis present

## 2019-09-06 DIAGNOSIS — Z20822 Contact with and (suspected) exposure to covid-19: Secondary | ICD-10-CM | POA: Diagnosis present

## 2019-09-06 DIAGNOSIS — I5033 Acute on chronic diastolic (congestive) heart failure: Secondary | ICD-10-CM | POA: Diagnosis not present

## 2019-09-06 DIAGNOSIS — F32A Depression, unspecified: Secondary | ICD-10-CM | POA: Diagnosis present

## 2019-09-06 DIAGNOSIS — E872 Acidosis: Secondary | ICD-10-CM | POA: Diagnosis not present

## 2019-09-06 DIAGNOSIS — I452 Bifascicular block: Secondary | ICD-10-CM | POA: Diagnosis present

## 2019-09-06 DIAGNOSIS — G9341 Metabolic encephalopathy: Secondary | ICD-10-CM | POA: Diagnosis present

## 2019-09-06 DIAGNOSIS — Z4659 Encounter for fitting and adjustment of other gastrointestinal appliance and device: Secondary | ICD-10-CM

## 2019-09-06 DIAGNOSIS — J189 Pneumonia, unspecified organism: Secondary | ICD-10-CM | POA: Diagnosis not present

## 2019-09-06 DIAGNOSIS — E662 Morbid (severe) obesity with alveolar hypoventilation: Secondary | ICD-10-CM | POA: Diagnosis present

## 2019-09-06 DIAGNOSIS — E8770 Fluid overload, unspecified: Secondary | ICD-10-CM | POA: Diagnosis present

## 2019-09-06 DIAGNOSIS — E119 Type 2 diabetes mellitus without complications: Secondary | ICD-10-CM

## 2019-09-06 DIAGNOSIS — E8779 Other fluid overload: Secondary | ICD-10-CM | POA: Diagnosis present

## 2019-09-06 DIAGNOSIS — R0602 Shortness of breath: Secondary | ICD-10-CM | POA: Diagnosis present

## 2019-09-06 DIAGNOSIS — R0902 Hypoxemia: Secondary | ICD-10-CM

## 2019-09-06 DIAGNOSIS — Y95 Nosocomial condition: Secondary | ICD-10-CM | POA: Diagnosis not present

## 2019-09-06 DIAGNOSIS — I110 Hypertensive heart disease with heart failure: Secondary | ICD-10-CM | POA: Diagnosis present

## 2019-09-06 DIAGNOSIS — J9690 Respiratory failure, unspecified, unspecified whether with hypoxia or hypercapnia: Secondary | ICD-10-CM

## 2019-09-06 DIAGNOSIS — Z9911 Dependence on respirator [ventilator] status: Secondary | ICD-10-CM

## 2019-09-06 DIAGNOSIS — Z9289 Personal history of other medical treatment: Secondary | ICD-10-CM

## 2019-09-06 DIAGNOSIS — I959 Hypotension, unspecified: Secondary | ICD-10-CM | POA: Diagnosis not present

## 2019-09-06 DIAGNOSIS — D696 Thrombocytopenia, unspecified: Secondary | ICD-10-CM | POA: Diagnosis not present

## 2019-09-06 DIAGNOSIS — Z978 Presence of other specified devices: Secondary | ICD-10-CM

## 2019-09-06 DIAGNOSIS — J810 Acute pulmonary edema: Secondary | ICD-10-CM

## 2019-09-06 LAB — TROPONIN I (HIGH SENSITIVITY)
Troponin I (High Sensitivity): 27 ng/L — ABNORMAL HIGH (ref <18)
Troponin I (High Sensitivity): 27 ng/L — ABNORMAL HIGH (ref <18)

## 2019-09-06 LAB — CBC WITH DIFFERENTIAL/PLATELET
Abs Immature Granulocytes: 0.03 10*3/uL (ref 0.00–0.07)
Basophils Absolute: 0 10*3/uL (ref 0.0–0.1)
Basophils Relative: 0 %
Eosinophils Absolute: 0 10*3/uL (ref 0.0–0.5)
Eosinophils Relative: 1 %
HCT: 22.9 % — ABNORMAL LOW (ref 36.0–46.0)
Hemoglobin: 7.5 g/dL — ABNORMAL LOW (ref 12.0–15.0)
Immature Granulocytes: 1 %
Lymphocytes Relative: 8 %
Lymphs Abs: 0.5 10*3/uL — ABNORMAL LOW (ref 0.7–4.0)
MCH: 32.8 pg (ref 26.0–34.0)
MCHC: 32.8 g/dL (ref 30.0–36.0)
MCV: 100 fL (ref 80.0–100.0)
Monocytes Absolute: 0.3 10*3/uL (ref 0.1–1.0)
Monocytes Relative: 4 %
Neutro Abs: 5.1 10*3/uL (ref 1.7–7.7)
Neutrophils Relative %: 86 %
Platelets: 155 10*3/uL (ref 150–400)
RBC: 2.29 MIL/uL — ABNORMAL LOW (ref 3.87–5.11)
RDW: 13.2 % (ref 11.5–15.5)
WBC: 5.9 10*3/uL (ref 4.0–10.5)
nRBC: 0 % (ref 0.0–0.2)

## 2019-09-06 LAB — SARS CORONAVIRUS 2 (TAT 6-24 HRS): SARS Coronavirus 2: NEGATIVE

## 2019-09-06 LAB — GLUCOSE, CAPILLARY
Glucose-Capillary: 145 mg/dL — ABNORMAL HIGH (ref 70–99)
Glucose-Capillary: 147 mg/dL — ABNORMAL HIGH (ref 70–99)

## 2019-09-06 LAB — URINALYSIS, ROUTINE W REFLEX MICROSCOPIC
Bacteria, UA: NONE SEEN
Bilirubin Urine: NEGATIVE
Glucose, UA: >=500 mg/dL — AB
Hgb urine dipstick: NEGATIVE
Ketones, ur: NEGATIVE mg/dL
Leukocytes,Ua: NEGATIVE
Nitrite: NEGATIVE
Protein, ur: >=300 mg/dL — AB
Specific Gravity, Urine: 1.015 (ref 1.005–1.030)
pH: 6 (ref 5.0–8.0)

## 2019-09-06 LAB — COMPREHENSIVE METABOLIC PANEL
ALT: 20 U/L (ref 0–44)
AST: 19 U/L (ref 15–41)
Albumin: 2.6 g/dL — ABNORMAL LOW (ref 3.5–5.0)
Alkaline Phosphatase: 73 U/L (ref 38–126)
Anion gap: 12 (ref 5–15)
BUN: 62 mg/dL — ABNORMAL HIGH (ref 8–23)
CO2: 26 mmol/L (ref 22–32)
Calcium: 8.2 mg/dL — ABNORMAL LOW (ref 8.9–10.3)
Chloride: 104 mmol/L (ref 98–111)
Creatinine, Ser: 6.34 mg/dL — ABNORMAL HIGH (ref 0.44–1.00)
GFR calc Af Amer: 7 mL/min — ABNORMAL LOW (ref >60)
GFR calc non Af Amer: 6 mL/min — ABNORMAL LOW (ref >60)
Glucose, Bld: 148 mg/dL — ABNORMAL HIGH (ref 70–99)
Potassium: 4 mmol/L (ref 3.5–5.1)
Sodium: 142 mmol/L (ref 135–145)
Total Bilirubin: 0.5 mg/dL (ref 0.3–1.2)
Total Protein: 5.8 g/dL — ABNORMAL LOW (ref 6.5–8.1)

## 2019-09-06 LAB — HEMOGLOBIN A1C
Hgb A1c MFr Bld: 5.2 % (ref 4.8–5.6)
Mean Plasma Glucose: 102.54 mg/dL

## 2019-09-06 LAB — D-DIMER, QUANTITATIVE: D-Dimer, Quant: 2.58 ug/mL-FEU — ABNORMAL HIGH (ref 0.00–0.50)

## 2019-09-06 LAB — CBG MONITORING, ED: Glucose-Capillary: 197 mg/dL — ABNORMAL HIGH (ref 70–99)

## 2019-09-06 LAB — BRAIN NATRIURETIC PEPTIDE: B Natriuretic Peptide: 1113 pg/mL — ABNORMAL HIGH (ref 0.0–100.0)

## 2019-09-06 MED ORDER — LEVOTHYROXINE SODIUM 25 MCG PO TABS
25.0000 ug | ORAL_TABLET | Freq: Every day | ORAL | Status: DC
  Administered 2019-09-06 – 2019-09-08 (×3): 25 ug via ORAL
  Filled 2019-09-06 (×3): qty 1

## 2019-09-06 MED ORDER — ALBUTEROL SULFATE HFA 108 (90 BASE) MCG/ACT IN AERS
8.0000 | INHALATION_SPRAY | RESPIRATORY_TRACT | Status: DC | PRN
  Administered 2019-09-06: 8 via RESPIRATORY_TRACT
  Filled 2019-09-06: qty 6.7

## 2019-09-06 MED ORDER — EPINEPHRINE 0.3 MG/0.3ML IJ DEVI: 0.3000 mg | Freq: Once | INTRAMUSCULAR | Status: DC | PRN

## 2019-09-06 MED ORDER — INSULIN ASPART 100 UNIT/ML ~~LOC~~ SOLN
0.0000 [IU] | Freq: Three times a day (TID) | SUBCUTANEOUS | Status: DC
  Administered 2019-09-06: 3 [IU] via SUBCUTANEOUS
  Administered 2019-09-06: 2 [IU] via SUBCUTANEOUS
  Administered 2019-09-07: 3 [IU] via SUBCUTANEOUS
  Administered 2019-09-08: 5 [IU] via SUBCUTANEOUS
  Filled 2019-09-06: qty 1

## 2019-09-06 MED ORDER — ACETAMINOPHEN 325 MG PO TABS: 650.0000 mg | ORAL_TABLET | Freq: Four times a day (QID) | ORAL | Status: DC | PRN

## 2019-09-06 MED ORDER — SODIUM CHLORIDE 0.9% FLUSH: 3.0000 mL | INTRAVENOUS | Status: DC | PRN

## 2019-09-06 MED ORDER — CARVEDILOL 3.125 MG PO TABS
6.2500 mg | ORAL_TABLET | Freq: Two times a day (BID) | ORAL | Status: DC
  Administered 2019-09-06 – 2019-09-08 (×5): 6.25 mg via ORAL
  Filled 2019-09-06: qty 2
  Filled 2019-09-06: qty 1
  Filled 2019-09-06 (×3): qty 2

## 2019-09-06 MED ORDER — GABAPENTIN 300 MG PO CAPS: 300.0000 mg | ORAL_CAPSULE | Freq: Two times a day (BID) | ORAL | Status: DC

## 2019-09-06 MED ORDER — INFLUENZA VAC A&B SA ADJ QUAD 0.5 ML IM PRSY
0.5000 mL | PREFILLED_SYRINGE | INTRAMUSCULAR | Status: DC
  Filled 2019-09-06: qty 0.5

## 2019-09-06 MED ORDER — ATORVASTATIN CALCIUM 20 MG PO TABS
20.0000 mg | ORAL_TABLET | Freq: Every day | ORAL | Status: DC
  Administered 2019-09-06 – 2019-09-08 (×3): 20 mg via ORAL
  Filled 2019-09-06 (×2): qty 1
  Filled 2019-09-06: qty 2

## 2019-09-06 MED ORDER — TRAZODONE HCL 50 MG PO TABS: 50.0000 mg | ORAL_TABLET | Freq: Every evening | ORAL | Status: DC | PRN

## 2019-09-06 MED ORDER — PANTOPRAZOLE SODIUM 40 MG PO TBEC
40.0000 mg | DELAYED_RELEASE_TABLET | Freq: Every day | ORAL | Status: DC
  Administered 2019-09-06 – 2019-09-08 (×3): 40 mg via ORAL
  Filled 2019-09-06 (×3): qty 1

## 2019-09-06 MED ORDER — VITAMIN B-6 50 MG PO TABS
200.0000 mg | ORAL_TABLET | Freq: Two times a day (BID) | ORAL | Status: DC
  Administered 2019-09-06 – 2019-09-08 (×5): 200 mg via ORAL
  Filled 2019-09-06 (×3): qty 4
  Filled 2019-09-06: qty 2
  Filled 2019-09-06: qty 4
  Filled 2019-09-06: qty 2
  Filled 2019-09-06: qty 4
  Filled 2019-09-06: qty 2

## 2019-09-06 MED ORDER — DARBEPOETIN ALFA 150 MCG/0.3ML IJ SOSY
150.0000 ug | PREFILLED_SYRINGE | INTRAMUSCULAR | Status: DC
  Administered 2019-09-06 – 2019-09-27 (×4): 150 ug via SUBCUTANEOUS
  Filled 2019-09-06 (×5): qty 0.3

## 2019-09-06 MED ORDER — HYDROXYZINE HCL 25 MG PO TABS: 25.0000 mg | ORAL_TABLET | Freq: Three times a day (TID) | ORAL | Status: DC | PRN

## 2019-09-06 MED ORDER — ADULT MULTIVITAMIN W/MINERALS CH
1.0000 | ORAL_TABLET | Freq: Every morning | ORAL | Status: DC
  Administered 2019-09-06 – 2019-09-08 (×3): 1 via ORAL
  Filled 2019-09-06 (×4): qty 1

## 2019-09-06 MED ORDER — VITAMIN D 25 MCG (1000 UNIT) PO TABS
4000.0000 [IU] | ORAL_TABLET | Freq: Every morning | ORAL | Status: DC
  Administered 2019-09-06 – 2019-09-09 (×4): 4000 [IU] via ORAL
  Filled 2019-09-06 (×10): qty 4

## 2019-09-06 MED ORDER — LINAGLIPTIN 5 MG PO TABS
5.0000 mg | ORAL_TABLET | Freq: Every morning | ORAL | Status: DC
  Administered 2019-09-06 – 2019-09-08 (×3): 5 mg via ORAL
  Filled 2019-09-06 (×4): qty 1

## 2019-09-06 MED ORDER — INSULIN GLARGINE 100 UNIT/ML ~~LOC~~ SOLN
16.0000 [IU] | Freq: Every day | SUBCUTANEOUS | Status: DC
  Administered 2019-09-06 – 2019-09-20 (×12): 16 [IU] via SUBCUTANEOUS
  Filled 2019-09-06 (×20): qty 0.16

## 2019-09-06 MED ORDER — PRIMIDONE 50 MG PO TABS
50.0000 mg | ORAL_TABLET | Freq: Every morning | ORAL | Status: DC
  Administered 2019-09-06 – 2019-09-08 (×3): 50 mg via ORAL
  Filled 2019-09-06 (×6): qty 1

## 2019-09-06 MED ORDER — SODIUM CHLORIDE 0.9% FLUSH
3.0000 mL | Freq: Two times a day (BID) | INTRAVENOUS | Status: DC
  Administered 2019-09-06 – 2019-09-16 (×20): 3 mL via INTRAVENOUS
  Administered 2019-09-17: 10 mL via INTRAVENOUS
  Administered 2019-09-18 – 2019-09-30 (×22): 3 mL via INTRAVENOUS

## 2019-09-06 MED ORDER — METHYLPREDNISOLONE SODIUM SUCC 125 MG IJ SOLR
125.0000 mg | Freq: Once | INTRAMUSCULAR | Status: AC
  Administered 2019-09-06: 125 mg via INTRAVENOUS
  Filled 2019-09-06: qty 2

## 2019-09-06 MED ORDER — ONDANSETRON HCL 4 MG/2ML IJ SOLN
4.0000 mg | Freq: Four times a day (QID) | INTRAMUSCULAR | Status: DC | PRN
  Administered 2019-09-08 – 2019-09-26 (×4): 4 mg via INTRAVENOUS
  Filled 2019-09-06 (×4): qty 2

## 2019-09-06 MED ORDER — INSULIN ASPART 100 UNIT/ML ~~LOC~~ SOLN: 0.0000 [IU] | Freq: Every day | SUBCUTANEOUS | Status: DC

## 2019-09-06 MED ORDER — SODIUM CHLORIDE 0.9 % IV SOLN: 250.0000 mL | INTRAVENOUS | Status: DC | PRN

## 2019-09-06 MED ORDER — HEPARIN SODIUM (PORCINE) 5000 UNIT/ML IJ SOLN
5000.0000 [IU] | Freq: Three times a day (TID) | INTRAMUSCULAR | Status: DC
  Administered 2019-09-06 – 2019-09-10 (×13): 5000 [IU] via SUBCUTANEOUS
  Filled 2019-09-06 (×13): qty 1

## 2019-09-06 MED ORDER — GABAPENTIN 100 MG PO CAPS
100.0000 mg | ORAL_CAPSULE | Freq: Two times a day (BID) | ORAL | Status: DC
  Administered 2019-09-06 – 2019-09-08 (×5): 100 mg via ORAL
  Filled 2019-09-06 (×6): qty 1

## 2019-09-06 MED ORDER — AMLODIPINE BESYLATE 5 MG PO TABS: 5.0000 mg | ORAL_TABLET | Freq: Every day | ORAL | Status: DC

## 2019-09-06 MED ORDER — LABETALOL HCL 5 MG/ML IV SOLN
10.0000 mg | INTRAVENOUS | Status: DC | PRN
  Administered 2019-09-14 – 2019-09-19 (×3): 10 mg via INTRAVENOUS
  Filled 2019-09-06 (×3): qty 4

## 2019-09-06 MED ORDER — POLYETHYLENE GLYCOL 3350 17 G PO PACK
17.0000 g | PACK | Freq: Every day | ORAL | Status: DC | PRN
  Administered 2019-09-08: 17 g via ORAL
  Filled 2019-09-06: qty 1

## 2019-09-06 MED ORDER — DULOXETINE HCL 60 MG PO CPEP
60.0000 mg | ORAL_CAPSULE | Freq: Every day | ORAL | Status: DC
  Administered 2019-09-06 – 2019-09-08 (×3): 60 mg via ORAL
  Filled 2019-09-06: qty 2
  Filled 2019-09-06 (×2): qty 1

## 2019-09-06 MED ORDER — ACETAMINOPHEN 325 MG PO TABS
325.0000 mg | ORAL_TABLET | Freq: Two times a day (BID) | ORAL | Status: DC
  Administered 2019-09-06 – 2019-09-08 (×5): 325 mg via ORAL
  Filled 2019-09-06 (×6): qty 1

## 2019-09-06 MED ORDER — ASPIRIN 81 MG PO CHEW
81.0000 mg | CHEWABLE_TABLET | Freq: Every morning | ORAL | Status: DC
  Administered 2019-09-06 – 2019-09-08 (×3): 81 mg via ORAL
  Filled 2019-09-06 (×3): qty 1

## 2019-09-06 MED ORDER — ALBUTEROL SULFATE (2.5 MG/3ML) 0.083% IN NEBU
2.5000 mg | INHALATION_SOLUTION | RESPIRATORY_TRACT | Status: DC | PRN
  Administered 2019-09-08 – 2019-09-10 (×2): 2.5 mg via RESPIRATORY_TRACT
  Filled 2019-09-06 (×3): qty 3

## 2019-09-06 MED ORDER — ONDANSETRON HCL 4 MG PO TABS: 4.0000 mg | ORAL_TABLET | Freq: Four times a day (QID) | ORAL | Status: DC | PRN

## 2019-09-06 MED ORDER — DULAGLUTIDE 1.5 MG/0.5ML ~~LOC~~ SOAJ: 1.5000 mg | SUBCUTANEOUS | Status: DC

## 2019-09-06 MED ORDER — PRAZOSIN HCL 2 MG PO CAPS: 2.0000 mg | ORAL_CAPSULE | Freq: Every day | ORAL | Status: DC

## 2019-09-06 MED ORDER — ACETAMINOPHEN 650 MG RE SUPP: 650.0000 mg | Freq: Four times a day (QID) | RECTAL | Status: DC | PRN

## 2019-09-06 MED ORDER — BACITRACIN-NEOMYCIN-POLYMYXIN OINTMENT TUBE
1.0000 "application " | TOPICAL_OINTMENT | Freq: Every day | CUTANEOUS | Status: DC
  Administered 2019-09-06 – 2019-10-01 (×24): 1 via TOPICAL
  Filled 2019-09-06: qty 1
  Filled 2019-09-06 (×3): qty 14

## 2019-09-06 MED ORDER — INSULIN GLARGINE 100 UNIT/ML ~~LOC~~ SOLN
18.0000 [IU] | Freq: Every day | SUBCUTANEOUS | Status: DC
  Filled 2019-09-06: qty 0.18

## 2019-09-06 MED ORDER — FUROSEMIDE 10 MG/ML IJ SOLN
100.0000 mg | Freq: Once | INTRAMUSCULAR | Status: AC
  Administered 2019-09-06: 100 mg via INTRAVENOUS
  Filled 2019-09-06: qty 12

## 2019-09-06 MED ORDER — TRAZODONE HCL 50 MG PO TABS
50.0000 mg | ORAL_TABLET | Freq: Every day | ORAL | Status: DC
  Administered 2019-09-06 – 2019-09-07 (×3): 50 mg via ORAL
  Filled 2019-09-06 (×4): qty 1

## 2019-09-06 MED ORDER — ALBUTEROL SULFATE (2.5 MG/3ML) 0.083% IN NEBU
2.5000 mg | INHALATION_SOLUTION | RESPIRATORY_TRACT | Status: DC | PRN
  Administered 2019-09-07 – 2019-09-11 (×7): 2.5 mg via RESPIRATORY_TRACT
  Filled 2019-09-06 (×6): qty 3

## 2019-09-06 MED ORDER — NITROGLYCERIN 2 % TD OINT
1.0000 [in_us] | TOPICAL_OINTMENT | Freq: Four times a day (QID) | TRANSDERMAL | Status: AC
  Administered 2019-09-06 (×2): 1 [in_us] via TOPICAL
  Filled 2019-09-06 (×2): qty 1

## 2019-09-06 MED ORDER — FUROSEMIDE 10 MG/ML IJ SOLN
80.0000 mg | Freq: Two times a day (BID) | INTRAMUSCULAR | Status: DC
  Administered 2019-09-06 – 2019-09-07 (×2): 80 mg via INTRAVENOUS
  Filled 2019-09-06 (×2): qty 8

## 2019-09-06 MED ORDER — LORAZEPAM 1 MG PO TABS: 1.0000 mg | ORAL_TABLET | Freq: Every day | ORAL | Status: DC | PRN

## 2019-09-06 MED ORDER — RISPERIDONE 0.5 MG PO TABS
0.5000 mg | ORAL_TABLET | Freq: Two times a day (BID) | ORAL | Status: DC
  Administered 2019-09-06 – 2019-09-08 (×5): 0.5 mg via ORAL
  Filled 2019-09-06 (×6): qty 1

## 2019-09-06 MED ORDER — MONTELUKAST SODIUM 10 MG PO TABS
10.0000 mg | ORAL_TABLET | Freq: Every day | ORAL | Status: DC
  Administered 2019-09-06 – 2019-09-07 (×2): 10 mg via ORAL
  Filled 2019-09-06 (×3): qty 1

## 2019-09-06 NOTE — ED Provider Notes (Signed)
[facility] EMERGENCY DEPARTMENT Provider Note   CSN: [id number] Arrival date & time: 09/05/19  2238   Time seen 3:10 AM  History Chief Complaint  Patient presents with  . Shortness of Breath    [name] is a 69 y.o. female.  HPI   Patient reports yesterday morning she started getting shortness of breath, she states she noted her legs started swelling about a week ago when she has had that before with congestive heart failure.  She also feels like her abdomen is been getting swollen.  She denies cough, rhinorrhea, or sore throat.  She denies chest pain.  She states she has been wheezing which she has had before.  She states she does not have a cardiologist.  She quit smoking in 1973.  She states tonight she got up to walk from her living room to her bedroom and when she stood up she felt dizzy like she is going to pass out and lightheaded and she did pass out.  She states her home health nurse was there and she was not unconscious for a long.  She denies any injury from passing out.  She states the last time she passed out was about a year ago and it was from having congestive heart failure.  She denies being on home oxygen.                                                                                                                                                                                                                                                                     PCP Glenda Chroman, MD   Past Medical History:  Diagnosis Date  . Anxiety   . Asthma   . Back pain   . Barrett esophagus   . CHF (congestive heart failure) ([place])   . CKD (chronic kidney disease) stage 3, GFR 30-59 ml/min   . COPD (chronic obstructive pulmonary disease) ([place])   . Depression   . Diastolic heart failure ([place])   . Essential hypertension   . Fibromyalgia   . GERD (gastroesophageal reflux disease)   . Gout   . History of pericarditis   . Hyperlipidemia   . Hypothyroid   . Major  depressive disorder   . Memory  loss   . Nephrolithiasis    2008  . Obesity hypoventilation syndrome (HCC)    CPAP  . Obstructive sleep apnea   . PE (pulmonary embolism)    2010  . Peripheral neuropathy   . PTSD (post-traumatic stress disorder)   . Rheumatoid arthritis ([place])   . Schizophrenia ([place])   . 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 Steroid dependence ([place])   . Type 2 diabetes mellitus ([place])   . Vitamin D deficiency     Patient Active Problem List   Diagnosis Date Noted  . Volume overload 09/06/2019  . Altered mental state 11/28/2018  . Near syncope 11/28/2018  . Fall at home 11/28/2018  . History of seizure 11/28/2018  . 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 Pressure injury of skin 09/17/2018  . Respiratory failure ([place]) 08/16/2018  . Seizure ([place]) 08/16/2018  . Acute encephalopathy 07/27/2018  . Endotracheally intubated 07/27/2018  . Hypothyroidism 07/27/2018  . Pleuritic chest pain 07/14/2018  . Moderate persistent asthma without complication   . Type 2 diabetes mellitus ([place]) 04/19/2018  . Acute kidney injury superimposed on chronic kidney disease ([place]) 01/13/2018  . Altered mental status   . Encephalopathy 10/11/2016  . Chest pain 08/12/2016  . CKD (chronic kidney disease), stage IV ([place]) 08/12/2016  . History of pulmonary embolus (PE) 08/12/2016  . Obesity, Class III, BMI 40-49.9 (morbid obesity) ([place]) 08/12/2016  . RBBB 08/12/2016  . Positive D dimer 08/12/2016  . Cerebral infarction ([place]) 11/10/2015  . Physical deconditioning 07/01/2011  . Weakness 07/01/2011  . Diabetic foot ulcer ([place]) 05/19/2011  . Polypharmacy 02/09/2011  . BACK PAIN WITH RADICULOPATHY 05/25/2010  . Type 2 diabetes mellitus with diabetic polyneuropathy, without long-term current use of insulin ([place]) 08/18/2009  . Chronic diastolic heart failure ([place]) 04/25/2009  . Normocytic anemia 04/14/2009  . Sleep apnea 12/11/2008  . Diabetic peripheral neuropathy ([place])  05/04/2007  . HYPERCHOLESTEROLEMIA 10/13/2006  . Gout, unspecified 10/13/2006  . HYPOKALEMIA 10/13/2006  . Schizophrenia ([place]) 10/13/2006  . Depression 10/13/2006  . Essential hypertension 10/13/2006  . Venous (peripheral) insufficiency 10/13/2006  . RHINITIS, ALLERGIC 10/13/2006  . Asthma 10/13/2006  . Reflux esophagitis 10/13/2006  . OSTEOARTHRITIS, MULTI SITES 10/13/2006  . INCONTINENCE, URGE 10/13/2006    Past Surgical History:  Procedure Laterality Date  . APPENDECTOMY    . CHOLECYSTECTOMY    . COLONOSCOPY     benign polyps (12/2000), repeat colonoscopy performed in 2007  . ENDOMETRIAL BIOPSY     10/03 benign columnar mucosa (limited material)  . KIDNEY SURGERY       OB History   No obstetric history on file.     Family History  Problem Relation Age of Onset  . Bone cancer Mother   . Hypertension Mother   . Heart disease Mother   . COPD Father   . Heart disease Father   . Stroke Father     Social History   Tobacco Use  . Smoking status: Former Smoker    Types: Cigarettes    Quit date: 06/23/1972    Years since quitting: 47.2  . Smokeless tobacco: Never Used  Substance Use Topics  . Alcohol use: No  . Drug use: No  lives at home  Home Medications Prior to Admission medications   Medication Sig Start Date End Date Taking? Authorizing Provider  acetaminophen (TYLENOL) 325 MG tablet Take 325 mg by mouth 2 (two) times daily.     [provider]  amLODipine (NORVASC) 5 MG  tablet Take 1 tablet (5 mg total) by mouth daily. Patient taking differently: Take 5 mg by mouth every morning.  07/31/18 07/31/19  GhimireHenreitta Leber, MD  aspirin 81 MG tablet Take 81 mg by mouth every morning.     [provider]  atorvastatin (LIPITOR) 20 MG tablet Take 1 tablet by mouth daily. 05/14/19   [provider]  carvedilol (COREG) 6.25 MG tablet Take 1 tablet (6.25 mg total) by mouth 2 (two) times daily with a meal. 08/18/18   Lavina Hamman, MD    Cholecalciferol (VITAMIN D3) 50 MCG (2000 UT) TABS Take 4,000 Units by mouth every morning.     [provider]  docusate sodium (COLACE) 100 MG capsule Take 1 capsule (100 mg total) by mouth 2 (two) times daily. Hold for diarrhea Patient taking differently: Take 100 mg by mouth daily as needed. Hold for diarrhea 11/29/18   Barton Dubois, MD  Dulaglutide (TRULICITY) 1.5 0000000 SOPN Inject 1.5 mg into the skin every Friday.     [provider]  DULoxetine (CYMBALTA) 60 MG capsule Take 1 capsule by mouth daily. 05/11/19   [provider]  EPINEPHrine (EPI-PEN) 0.3 mg/0.3 mL DEVI Inject 0.3 mg into the muscle once as needed (for bee stings, then go to the ED immediately after using).     [provider]  gabapentin (NEURONTIN) 300 MG capsule Take 1 capsule (300 mg total) by mouth 2 (two) times daily. Patient taking differently: Take 300 mg by mouth 2 (two) times daily. & 600 mg at bedtime 11/29/18   Barton Dubois, MD  hydrOXYzine (ATARAX/VISTARIL) 25 MG tablet Take 25 mg by mouth 2 (two) times daily as needed.    [provider]  insulin degludec (TRESIBA FLEXTOUCH) 100 UNIT/ML SOPN FlexTouch Pen Inject 18 Units into the skin at bedtime.     [provider]  levothyroxine (SYNTHROID, LEVOTHROID) 25 MCG tablet Take 1 tablet (25 mcg total) by mouth daily at 6 (six) AM. 08/19/18   Lavina Hamman, MD  linagliptin (TRADJENTA) 5 MG TABS tablet Take 5 mg by mouth every morning.     [provider]  LORazepam (ATIVAN) 1 MG tablet Take 1 mg by mouth daily as needed for anxiety.    [provider]  montelukast (SINGULAIR) 10 MG tablet Take 10 mg by mouth at bedtime.    [provider]  Multiple Vitamin (DAILY VITE PO) Take 1 tablet by mouth every morning.     [provider]  omeprazole (PRILOSEC) 20 MG capsule Take 20 mg by mouth every morning.     [provider]  prazosin (MINIPRESS) 2 MG capsule Take 1 capsule  by mouth at bedtime. 05/11/19   [provider]  primidone (MYSOLINE) 50 MG tablet Take 50 mg by mouth every morning.    [provider]  pyridOXINE (VITAMIN B-6) 100 MG tablet Take 200 mg by mouth 2 (two) times daily.    [provider]  risperiDONE (RISPERDAL) 0.5 MG tablet Take 1 tablet by mouth 2 (two) times daily. 05/11/19   [provider]  torsemide (DEMADEX) 20 MG tablet TAKE 3 TABLETS (60mg ) BY MOUTH EVERY OTHER DAY; ALTERNATING WITH 2 TABLETS (40mg ) BY MOUTH EVERY OTHER DAY. 05/14/19   Strader, Fransisco Hertz, PA-C  traMADol (ULTRAM) 50 MG tablet Take 1 tablet by mouth every 6 (six) hours as needed. 03/29/19   [provider]  traZODone (DESYREL) 50 MG tablet Take 1 tablet by mouth daily.  05/11/19   [provider]  Vitamins A & D (VITAMIN A & D) ointment Apply 1 application topically See admin instructions. To be applied after baths in the morning due to rash under skin folds    [provider]    Allergies    Benztropine mesylate, Codeine, Diazepam, Diphenhydramine hcl, Penicillins, Sulfonamide derivatives, Thioridazine hcl, and Lisinopril  Review of Systems   Review of Systems  All other systems reviewed and are negative.   Physical Exam Updated Vital Signs BP (!) 179/92   Pulse 81   Temp 98.3 F (36.8 C) (Oral)   Resp 18   Ht 5' (1.524 m)   SpO2 100%   BMI 38.86 kg/m   Physical Exam Vitals and nursing note reviewed.  Constitutional:      General: She is not in acute distress.    Appearance: Normal appearance. She is well-developed. She is not ill-appearing or toxic-appearing.  HENT:     Head: Normocephalic and atraumatic.     Right Ear: External ear normal.     Left Ear: External ear normal.     Nose: Nose normal. No mucosal edema or rhinorrhea.     Mouth/Throat:     Dentition: No dental abscesses.     Pharynx: No uvula swelling.  Eyes:     Conjunctiva/sclera: Conjunctivae normal.     Pupils: Pupils are  equal, round, and reactive to light.  Cardiovascular:     Rate and Rhythm: Normal rate and regular rhythm.     Heart sounds: Murmur present. No friction rub. No gallop.   Pulmonary:     Effort: Tachypnea, accessory muscle usage, prolonged expiration and respiratory distress present.     Breath sounds: Wheezing present. No rhonchi or rales.     Comments: She has wheezing that is audible Chest:     Chest wall: No tenderness or crepitus.  Abdominal:     General: Bowel sounds are normal. There is distension.     Palpations: Abdomen is soft.     Tenderness: There is no abdominal tenderness. There is no guarding or rebound.  Musculoskeletal:        General: No tenderness. Normal range of motion.     Cervical back: Full passive range of motion without pain, normal range of motion and neck supple.     Right lower leg: Edema present.     Left lower leg: Edema present.     Comments: Moves all extremities well.   Skin:    General: Skin is warm and dry.     Coloration: Skin is not pale.     Findings: No erythema or rash.  Neurological:     General: No focal deficit present.     Mental Status: She is alert and oriented to person, place, and time.     Cranial Nerves: No cranial nerve deficit.  Psychiatric:        Mood and Affect: Mood normal. Mood is not anxious.        Speech: Speech normal.        Behavior: Behavior normal.        Thought Content: Thought content normal.     ED Results / Procedures / Treatments   Labs (all labs ordered are listed, but only abnormal results are displayed) Results for orders placed or performed during the hospital encounter of 09/06/19  Comprehensive metabolic panel  Result Value Ref Range   Sodium 142 135 - 145 mmol/L   Potassium 4.0 3.5 - 5.1  mmol/L   Chloride 104 98 - 111 mmol/L   CO2 26 22 - 32 mmol/L   Glucose, Bld 148 (H) 70 - 99 mg/dL   BUN 62 (H) 8 - 23 mg/dL   Creatinine, Ser 6.34 (H) 0.44 - 1.00 mg/dL   Calcium 8.2 (L) 8.9 - 10.3 mg/dL    Total Protein 5.8 (L) 6.5 - 8.1 g/dL   Albumin 2.6 (L) 3.5 - 5.0 g/dL   AST 19 15 - 41 U/L   ALT 20 0 - 44 U/L   Alkaline Phosphatase 73 38 - 126 U/L   Total Bilirubin 0.5 0.3 - 1.2 mg/dL   GFR calc non Af Amer 6 (L) >60 mL/min   GFR calc Af Amer 7 (L) >60 mL/min   Anion gap 12 5 - 15  Brain natriuretic peptide  Result Value Ref Range   B Natriuretic Peptide 1,113.0 (H) 0.0 - 100.0 pg/mL  CBC with Differential  Result Value Ref Range   WBC 5.9 4.0 - 10.5 K/uL   RBC 2.29 (L) 3.87 - 5.11 MIL/uL   Hemoglobin 7.5 (L) 12.0 - 15.0 g/dL   HCT 22.9 (L) 36.0 - 46.0 %   MCV 100.0 80.0 - 100.0 fL   MCH 32.8 26.0 - 34.0 pg   MCHC 32.8 30.0 - 36.0 g/dL   RDW 13.2 11.5 - 15.5 %   Platelets 155 150 - 400 K/uL   nRBC 0.0 0.0 - 0.2 %   Neutrophils Relative % 86 %   Neutro Abs 5.1 1.7 - 7.7 K/uL   Lymphocytes Relative 8 %   Lymphs Abs 0.5 (L) 0.7 - 4.0 K/uL   Monocytes Relative 4 %   Monocytes Absolute 0.3 0.1 - 1.0 K/uL   Eosinophils Relative 1 %   Eosinophils Absolute 0.0 0.0 - 0.5 K/uL   Basophils Relative 0 %   Basophils Absolute 0.0 0.0 - 0.1 K/uL   Immature Granulocytes 1 %   Abs Immature Granulocytes 0.03 0.00 - 0.07 K/uL  D-dimer, quantitative  Result Value Ref Range   D-Dimer, Quant 2.58 (H) 0.00 - 0.50 ug/mL-FEU  Troponin I (High Sensitivity)  Result Value Ref Range   Troponin I (High Sensitivity) 27 (H) <18 ng/L   Laboratory interpretation all normal except renal insufficiency which has worsened since October where her creatinine was 3, elevated D-dimer but her D-dimers have always been elevated, malnutrition, very elevated BNP compared to priors however they are several years old, mildly elevated troponin, stable anemia most likely from her renal disease    EKG EKG Interpretation  Date/Time:  Thursday September 06 2019 03:20:49 EST Ventricular Rate:  79 PR Interval:    QRS Duration: 179 QT Interval:  457 QTC Calculation: 524 R Axis:   -57 Text Interpretation: Sinus  rhythm RBBB and LAFB Probable left ventricular hypertrophy No significant change since last tracing 28 Nov 2018 Confirmed by Rolland Porter [phone]) on 09/06/2019 3:27:43 AM   Radiology DG Chest Port 1 View  Result Date: 09/06/2019 CLINICAL DATA:  Shortness of breath. EXAM: PORTABLE CHEST 1 VIEW COMPARISON:  One-view chest x-ray 08/29/2018 FINDINGS: Heart is enlarged. Mild interstitial opacities slightly more prominent than on the prior study. Heart size and interstitial pattern is exaggerated by low lung volumes. No significant airspace consolidation is present. No significant effusions are present. IMPRESSION: 1. Cardiomegaly with mild interstitial edema suggesting congestive heart failure. 2. Low lung volumes. Electronically Signed   By: San Morelle M.D.   On: 09/06/2019 04:29  Procedures .Critical Care Performed by: Rolland Porter, MD Authorized by: Rolland Porter, MD   Critical care provider statement:    Critical care time (minutes):  32   Critical care was necessary to treat or prevent imminent or life-threatening deterioration of the following conditions:  Respiratory failure and cardiac failure   Critical care was time spent personally by me on the following activities:  Discussions with consultants, evaluation of patient's response to treatment, examination of patient, obtaining history from patient or surrogate, ordering and review of laboratory studies, ordering and review of radiographic studies, pulse oximetry, re-evaluation of patient's condition and review of old charts   (including critical care time)  Medications Ordered in ED Medications  albuterol (VENTOLIN HFA) 108 (90 Base) MCG/ACT inhaler 8 puff (8 puffs Inhalation Given 09/06/19 0516)  nitroGLYCERIN (NITROGLYN) 2 % ointment 1 inch (1 inch Topical Given 09/06/19 0534)  furosemide (LASIX) injection 100 mg (100 mg Intravenous Given 09/06/19 0347)  methylPREDNISolone sodium succinate (SOLU-MEDROL) 125 mg/2 mL injection 125 mg  (125 mg Intravenous Given 09/06/19 0349)    ED Course  I have reviewed the triage vital signs and the nursing notes.  Pertinent labs & imaging results that were available during my care of the patient were reviewed by me and considered in my medical decision making (see chart for details).    MDM Rules/Calculators/A&P                      After my initial exam patient was given Lasix 100 mg IV, I had looked at her prior blood work which was in October when her creatinine was 3.  She was given albuterol inhalers for her wheezing, she was also given IV Solu-Medrol for her wheezing.  After reviewing patient's blood work her chronic renal insufficiency has gotten much worse since October.  Her chest x-ray also shows evidence of fluid overload, she also has edema of her extremities.  At this point I felt patient would benefit from hospitalization.  Her audible wheezing has improved with treatment.  5:13 AM Dr. Olevia Bowens, hospitalist will admit.   Final Clinical Impression(s) / ED Diagnoses Final diagnoses:  Acute renal failure superimposed on chronic kidney disease, unspecified CKD stage, unspecified acute renal failure type ([place])  Acute pulmonary edema ([place])    Rx / DC Orders Plan admission  Rolland Porter, MD, Barbette Or, MD 09/06/19 [phone]

## 2019-09-06 NOTE — Consult Note (Signed)
[facility] Renal Consultation Note  Requesting MD: Courage Indication for Consultation: A on CRF   HPI:  [name] is a 69 y.o. female with PMhx significant for HTN, DM (on trulicity), hyperlipidemia, diastolic heart failure,  schizoaffective D/O with episodes of catatonia and sz d/o- on primidone/cymbalta and risperdone.  She also seems to have CKD-  crt in the low to mid 2's in early 2020- then a crt in the system from October 2020 that was 3.12- at that time aldactone was stopped.  She resides at [facility]- an adult assisted living residence in [place].  She has not seen a nephrologist- her PCP is Dr. Woody Seller. Pt is brought to the ED last evening with c/o SOB, increased swelling.  Then an episode of presyncope/syncope (which she has had before-  Thought to be psychogenic ? )   In ER crt noted to be 6.3.  BP is actually high. Med list includes norvasc, coreg, prazosin.  She also appears to be on demedex 60 mg alt with 40 mg.   Other labs of note include hgb of 7.5, BNP of 1100, albumin of 2.6.  CXR- mild interstitial edema.  COVID test pending.  She admits to nausea without vomiting   Creat  Date/Time Value Ref Range Status  08/02/2018 02:11 PM 2.54 (H) 0.50 - 0.99 mg/dL Final    Comment:    For patients >77 years of age, the reference limit for Creatinine is approximately 13% higher for people identified as African-American. .    Creatinine, Ser  Date/Time Value Ref Range Status  09/06/2019 03:43 AM 6.34 (H) 0.44 - 1.00 mg/dL Final  06/11/2019 12:45 PM 3.12 (H) 0.44 - 1.00 mg/dL Final  11/28/2018 11:47 AM 2.49 (H) 0.44 - 1.00 mg/dL Final  10/09/2018 11:52 PM 2.19 (H) 0.44 - 1.00 mg/dL Final  09/27/2018 06:27 AM 2.17 (H) 0.44 - 1.00 mg/dL Final  09/18/2018 05:06 AM 2.14 (H) 0.44 - 1.00 mg/dL Final  09/17/2018 04:48 AM 2.04 (H) 0.44 - 1.00 mg/dL Final  09/15/2018 08:54 AM 2.28 (H) 0.44 - 1.00 mg/dL Final  09/13/2018 12:38 AM 2.19 (H) 0.44 - 1.00 mg/dL Final   08/22/2018 12:48 PM 2.28 (H) 0.44 - 1.00 mg/dL Final  08/22/2018 01:30 AM 2.26 (H) 0.44 - 1.00 mg/dL Final  08/19/2018 09:10 AM 1.94 (H) 0.44 - 1.00 mg/dL Final  08/18/2018 08:32 AM 1.95 (H) 0.44 - 1.00 mg/dL Final  08/17/2018 04:47 AM 1.97 (H) 0.44 - 1.00 mg/dL Final  08/16/2018 06:51 AM 2.33 (H) 0.44 - 1.00 mg/dL Final  08/16/2018 01:50 AM 2.38 (H) 0.44 - 1.00 mg/dL Final  08/16/2018 01:28 AM 2.40 (H) 0.44 - 1.00 mg/dL Final  08/15/2018 03:03 PM 2.37 (H) 0.44 - 1.00 mg/dL Final  07/30/2018 05:26 AM 2.50 (H) 0.44 - 1.00 mg/dL Final  07/29/2018 02:13 AM 2.27 (H) 0.44 - 1.00 mg/dL Final  07/28/2018 04:04 AM 2.37 (H) 0.44 - 1.00 mg/dL Final  07/27/2018 03:18 PM 2.40 (H) 0.44 - 1.00 mg/dL Final  07/27/2018 03:00 PM 2.33 (H) 0.44 - 1.00 mg/dL Final  07/14/2018 08:56 PM 2.17 (H) 0.44 - 1.00 mg/dL Final  06/03/2018 02:02 PM 2.35 (H) 0.44 - 1.00 mg/dL Final  06/01/2018 04:18 PM 2.72 (H) 0.44 - 1.00 mg/dL Final  04/27/2018 07:12 PM 2.42 (H) 0.44 - 1.00 mg/dL Final  04/21/2018 04:23 AM 2.37 (H) 0.44 - 1.00 mg/dL Final  04/18/2018 11:30 PM 2.34 (H) 0.44 - 1.00 mg/dL Final  04/12/2018 11:45 AM 2.30 (H) 0.44 - 1.00 mg/dL  Final  04/04/2018 09:13 PM 2.39 (H) 0.44 - 1.00 mg/dL Final  03/24/2018 09:59 AM 2.21 (H) 0.44 - 1.00 mg/dL Final  01/13/2018 02:47 AM 1.79 (H) 0.44 - 1.00 mg/dL Final  01/12/2018 11:07 AM 1.70 (H) 0.44 - 1.00 mg/dL Final  01/12/2018 10:56 AM 1.82 (H) 0.44 - 1.00 mg/dL Final  10/13/2016 03:30 AM 1.62 (H) 0.44 - 1.00 mg/dL Final  10/12/2016 05:54 AM 1.37 (H) 0.44 - 1.00 mg/dL Final  10/11/2016 08:26 PM 1.50 (H) 0.44 - 1.00 mg/dL Final  10/11/2016 08:20 PM 1.50 (H) 0.44 - 1.00 mg/dL Final  08/11/2016 11:55 PM 1.62 (H) 0.44 - 1.00 mg/dL Final  11/12/2015 03:00 AM 1.33 (H) 0.44 - 1.00 mg/dL Final  11/11/2015 02:39 AM 1.41 (H) 0.44 - 1.00 mg/dL Final  11/10/2015 07:31 PM 1.50 (H) 0.44 - 1.00 mg/dL Final  11/10/2015 07:05 PM 1.61 (H) 0.44 - 1.00 mg/dL Final  10/05/2011 09:52 AM  1.19 (H) 0.50 - 1.10 mg/dL Final  07/08/2011 07:30 AM 0.78 0.50 - 1.10 mg/dL Final  07/05/2011 06:55 AM 0.74 0.50 - 1.10 mg/dL Final  07/02/2011 07:05 AM 0.94 0.50 - 1.10 mg/dL Final  07/01/2011 05:28 PM 0.86 0.50 - 1.10 mg/dL Final  07/01/2011 06:45 AM 0.82 0.50 - 1.10 mg/dL Final  06/30/2011 06:42 AM 0.93 0.50 - 1.10 mg/dL Final  06/29/2011 05:00 AM 0.93 0.50 - 1.10 mg/dL Final     PMHx:   Past Medical History:  Diagnosis Date  . Anxiety   . Asthma   . Back pain   . Barrett esophagus   . CHF (congestive heart failure) ([place])   . CKD (chronic kidney disease) stage 3, GFR 30-59 ml/min   . COPD (chronic obstructive pulmonary disease) ([place])   . Depression   . Diastolic heart failure ([place])   . Essential hypertension   . Fibromyalgia   . GERD (gastroesophageal reflux disease)   . Gout   . History of pericarditis   . Hyperlipidemia   . Hypothyroid   . Major depressive disorder   . Memory loss   . Nephrolithiasis    2008  . Obesity hypoventilation syndrome ([facility])    CPAP  . Obstructive sleep apnea   . PE (pulmonary embolism)    2010  . Peripheral neuropathy   . PTSD (post-traumatic stress disorder)   . Rheumatoid arthritis ([place])   . Schizophrenia ([place])   . Secondary Parkinson disease ([place]) 10/19/2018  . Steroid dependence ([place])   . Type 2 diabetes mellitus ([place])   . Vitamin D deficiency     Past Surgical History:  Procedure Laterality Date  . APPENDECTOMY    . CHOLECYSTECTOMY    . COLONOSCOPY     benign polyps (12/2000), repeat colonoscopy performed in 2007  . ENDOMETRIAL BIOPSY     10/03 benign columnar mucosa (limited material)  . KIDNEY SURGERY      Family Hx:  Family History  Problem Relation Age of Onset  . Bone cancer Mother   . Hypertension Mother   . Heart disease Mother   . COPD Father   . Heart disease Father   . Stroke Father     Social History:  reports that she quit smoking about 47 years ago. Her smoking use included cigarettes. She has never  used smokeless tobacco. She reports that she does not drink alcohol or use drugs.  Allergies:  Allergies  Allergen Reactions  . Benztropine Mesylate Other (See Comments)    Alopecia and white spots on eyes  .  Codeine Swelling, Palpitations and Other (See Comments)    Mouth Swelling and Tachycardia   . Diazepam Other (See Comments)    COMA  . Diphenhydramine Hcl Anxiety, Palpitations and Other (See Comments)    Tachycardia and anxiousness  . Penicillins Swelling and Other (See Comments)    REACTION: ulcers in mouth, swelling, throat swelling  Has patient had a PCN reaction causing immediate rash, facial/tongue/throat swelling, SOB or lightheadedness with hypotension: YES Has patient had a PCN reaction causing severe rash involving Mucus membranes or skin necrosis: Unknown Has patient had a PCN reaction that required hospitalization Unknown Has patient had a PCN reaction occurring within the last 10 years: unknown If all of the above answers are "NO", then may proceed with Cephalospori  . Sulfonamide Derivatives Rash    Blisters, also  . Thioridazine Hcl Swelling  . Lisinopril Cough    Medications: Prior to Admission medications   Medication Sig Start Date End Date Taking? Authorizing Provider  acetaminophen (TYLENOL) 325 MG tablet Take 325 mg by mouth 2 (two) times daily.     [provider]  amLODipine (NORVASC) 5 MG tablet Take 1 tablet (5 mg total) by mouth daily. Patient taking differently: Take 5 mg by mouth every morning.  07/31/18 07/31/19  GhimireHenreitta Leber, MD  aspirin 81 MG tablet Take 81 mg by mouth every morning.     [provider]  atorvastatin (LIPITOR) 20 MG tablet Take 1 tablet by mouth daily. 05/14/19   [provider]  carvedilol (COREG) 6.25 MG tablet Take 1 tablet (6.25 mg total) by mouth 2 (two) times daily with a meal. 08/18/18   Lavina Hamman, MD  Cholecalciferol (VITAMIN D3) 50 MCG (2000 UT) TABS Take 4,000 Units by mouth every  morning.     [provider]  docusate sodium (COLACE) 100 MG capsule Take 1 capsule (100 mg total) by mouth 2 (two) times daily. Hold for diarrhea Patient taking differently: Take 100 mg by mouth daily as needed. Hold for diarrhea 11/29/18   Barton Dubois, MD  Dulaglutide (TRULICITY) 1.5 0000000 SOPN Inject 1.5 mg into the skin every Friday.     [provider]  DULoxetine (CYMBALTA) 60 MG capsule Take 1 capsule by mouth daily. 05/11/19   [provider]  EPINEPHrine (EPI-PEN) 0.3 mg/0.3 mL DEVI Inject 0.3 mg into the muscle once as needed (for bee stings, then go to the ED immediately after using).     [provider]  gabapentin (NEURONTIN) 300 MG capsule Take 1 capsule (300 mg total) by mouth 2 (two) times daily. Patient taking differently: Take 300 mg by mouth 2 (two) times daily. & 600 mg at bedtime 11/29/18   Barton Dubois, MD  hydrOXYzine (ATARAX/VISTARIL) 25 MG tablet Take 25 mg by mouth 2 (two) times daily as needed.    [provider]  insulin degludec (TRESIBA FLEXTOUCH) 100 UNIT/ML SOPN FlexTouch Pen Inject 18 Units into the skin at bedtime.     [provider]  levothyroxine (SYNTHROID, LEVOTHROID) 25 MCG tablet Take 1 tablet (25 mcg total) by mouth daily at 6 (six) AM. 08/19/18   Lavina Hamman, MD  linagliptin (TRADJENTA) 5 MG TABS tablet Take 5 mg by mouth every morning.     [provider]  LORazepam (ATIVAN) 1 MG tablet Take 1 mg by mouth daily as needed for anxiety.    [provider]  montelukast (SINGULAIR) 10 MG tablet Take 10 mg by mouth at bedtime.  [provider]  Multiple Vitamin (DAILY VITE PO) Take 1 tablet by mouth every morning.     [provider]  omeprazole (PRILOSEC) 20 MG capsule Take 20 mg by mouth every morning.     [provider]  prazosin (MINIPRESS) 2 MG capsule Take 1 capsule by mouth at bedtime. 05/11/19   [provider]  primidone (MYSOLINE) 50 MG  tablet Take 50 mg by mouth every morning.    [provider]  pyridOXINE (VITAMIN B-6) 100 MG tablet Take 200 mg by mouth 2 (two) times daily.    [provider]  risperiDONE (RISPERDAL) 0.5 MG tablet Take 1 tablet by mouth 2 (two) times daily. 05/11/19   [provider]  torsemide (DEMADEX) 20 MG tablet TAKE 3 TABLETS (60mg ) BY MOUTH EVERY OTHER DAY; ALTERNATING WITH 2 TABLETS (40mg ) BY MOUTH EVERY OTHER DAY. 05/14/19   Strader, Fransisco Hertz, PA-C  traMADol (ULTRAM) 50 MG tablet Take 1 tablet by mouth every 6 (six) hours as needed. 03/29/19   [provider]  traZODone (DESYREL) 50 MG tablet Take 1 tablet by mouth daily. 05/11/19   [provider]  Vitamins A & D (VITAMIN A & D) ointment Apply 1 application topically See admin instructions. To be applied after baths in the morning due to rash under skin folds    [provider]    I have reviewed the patient's current medications.  Labs:  Results for orders placed or performed during the hospital encounter of 09/06/19 (from the past 48 hour(s))  Comprehensive metabolic panel     Status: Abnormal   Collection Time: 09/06/19  3:43 AM  Result Value Ref Range   Sodium 142 135 - 145 mmol/L   Potassium 4.0 3.5 - 5.1 mmol/L   Chloride 104 98 - 111 mmol/L   CO2 26 22 - 32 mmol/L   Glucose, Bld 148 (H) 70 - 99 mg/dL   BUN 62 (H) 8 - 23 mg/dL   Creatinine, Ser 6.34 (H) 0.44 - 1.00 mg/dL   Calcium 8.2 (L) 8.9 - 10.3 mg/dL   Total Protein 5.8 (L) 6.5 - 8.1 g/dL   Albumin 2.6 (L) 3.5 - 5.0 g/dL   AST 19 15 - 41 U/L   ALT 20 0 - 44 U/L   Alkaline Phosphatase 73 38 - 126 U/L   Total Bilirubin 0.5 0.3 - 1.2 mg/dL   GFR calc non Af Amer 6 (L) >60 mL/min   GFR calc Af Amer 7 (L) >60 mL/min   Anion gap 12 5 - 15    Comment: Performed at [facility], [address], [place], [place] [address]  Brain natriuretic peptide     Status: Abnormal   Collection Time: 09/06/19  3:43 AM  Result Value Ref Range    B Natriuretic Peptide 1,113.0 (H) 0.0 - 100.0 pg/mL    Comment: Performed at [facility], [address], [place], [place] [address]  Troponin I (High Sensitivity)     Status: Abnormal   Collection Time: 09/06/19  3:43 AM  Result Value Ref Range   Troponin I (High Sensitivity) 27 (H) <18 ng/L    Comment: (NOTE) Elevated high sensitivity troponin I (hsTnI) values and significant  changes across serial measurements may suggest ACS but many other  chronic and acute conditions are known to elevate hsTnI results.  Refer to the "Links" section for chest pain algorithms and additional  guidance. Performed at [facility], [address], [place], [place] [address]  CBC with Differential     Status: Abnormal   Collection Time: 09/06/19  3:43 AM  Result Value Ref Range   WBC 5.9 4.0 - 10.5 K/uL   RBC 2.29 (L) 3.87 - 5.11 MIL/uL   Hemoglobin 7.5 (L) 12.0 - 15.0 g/dL   HCT 22.9 (L) 36.0 - 46.0 %   MCV 100.0 80.0 - 100.0 fL   MCH 32.8 26.0 - 34.0 pg   MCHC 32.8 30.0 - 36.0 g/dL   RDW 13.2 11.5 - 15.5 %   Platelets 155 150 - 400 K/uL   nRBC 0.0 0.0 - 0.2 %   Neutrophils Relative % 86 %   Neutro Abs 5.1 1.7 - 7.7 K/uL   Lymphocytes Relative 8 %   Lymphs Abs 0.5 (L) 0.7 - 4.0 K/uL   Monocytes Relative 4 %   Monocytes Absolute 0.3 0.1 - 1.0 K/uL   Eosinophils Relative 1 %   Eosinophils Absolute 0.0 0.0 - 0.5 K/uL   Basophils Relative 0 %   Basophils Absolute 0.0 0.0 - 0.1 K/uL   Immature Granulocytes 1 %   Abs Immature Granulocytes 0.03 0.00 - 0.07 K/uL    Comment: Performed at [facility], [address] [place], [place] [address]  D-dimer, quantitative     Status: Abnormal   Collection Time: 09/06/19  3:43 AM  Result Value Ref Range   D-Dimer, Quant 2.58 (H) 0.00 - 0.50 ug/mL-FEU    Comment: (NOTE) At the manufacturer cut-off of 0.50 ug/mL FEU, this assay has been documented to exclude PE with a sensitivity and negative predictive value of 97 to 99%.  At this time, this assay has  not been approved by the FDA to exclude DVT/VTE. Results should be correlated with clinical presentation. Performed at [facility], [address] [place], [place] [address]   Troponin I (High Sensitivity)     Status: Abnormal   Collection Time: 09/06/19  5:56 AM  Result Value Ref Range   Troponin I (High Sensitivity) 27 (H) <18 ng/L    Comment: (NOTE) Elevated high sensitivity troponin I (hsTnI) values and significant  changes across serial measurements may suggest ACS but many other  chronic and acute conditions are known to elevate hsTnI results.  Refer to the "Links" section for chest pain algorithms and additional  guidance. Performed at [facility], [address], [place], [place] [address]      ROS:  Constitutional: positive for fatigue Cardiovascular: positive for lower extremity edema Gastrointestinal: positive for nausea  Physical Exam: Vitals:   09/06/19 0600 09/06/19 0630  BP: (!) 157/84 (!) 151/78  Pulse: 80 76  Resp: 19 19  Temp:    SpO2: 100% 100%     General: pale, nervous , obese WF-  She is simple in conversation- pressured speech HEENT: PERRLA, EOMI- mucous membranes moist  Neck: no JVD Heart: RRR Lungs: dec BS at bases Abdomen: obese-  abd wall edema Extremities: pitting edema throughout Skin: warm and dry Neuro: one word answers- simple- oriented to person and place  Assessment/Plan: 69 year old WF with DM, HTN, psych issues with progressive CKD 1.Renal- known CKD that appeared to be progressing-   Now with GFR under 10, volume overload and probably some uremic sxms. Unfortunately suspect that this is just natural progression of CKD and she is now at ESRD.  I will check a U/A, renal ultrasound to look for reversibility and trend her kidney function.  There are no absolute acute HD needs but if numbers  do not improve and she continues to feel poorly likely will need to initiate HD early next week.  I have told her the basics, she did not seem  alarmed.  "if it will keep me alive, I want to do it"  She denies any family who helps her make decisions.  There is 300 of UOP in purewick container 2. Hypertension/volume  - volume overload-  Have d/w hosps-  Will do lasix iv 80 BID and follow-  Am going to stop her norvasc and prazosin-  Cont her coreg-  Suspect BP will go down with diuresis 3. DM-  Per hosps-  Hold her trulicity-- I also have decreased her neurontin in the setting of low GFR 4. Anemia  - is significant-  Arguing for CKD-- will check iron stores and start ESA 5. Bones-  Will check PTH and phos and treat as needed  6. Syncope-  Difficult to know as she has had this presentation in the past and felt to be psychogenic   Louis Meckel 09/06/2019, 8:33 AM

## 2019-09-06 NOTE — ED Notes (Signed)
Dr. Joesph Fillers advised he took patient off o2 at 9:08 AM Check o2 sat at 9:30

## 2019-09-06 NOTE — H&P (Signed)
Patient Demographics:    [name], is a 69 y.o. female  MRN: [id number]   DOB - 06-Nov-1950  Admit Date - 09/06/2019  Outpatient Primary MD for the patient is Glenda Chroman, MD   Assessment & Plan:    Principal Problem:   Acute kidney injury superimposed on chronic kidney disease ([place]) Active Problems:   Volume overload   Type 2 diabetes mellitus with diabetic polyneuropathy, without long-term current use of insulin (HCC)   Normocytic anemia   Schizophrenia ([place])   Depression   Essential hypertension   Chronic diastolic heart failure (HCC)   CKD (chronic kidney disease), stage IV (HCC)   Obesity, Class III, BMI 40-49.9 (morbid obesity) ([place])   Type 2 diabetes mellitus ([place])   Hypothyroidism    1)AKI----acute kidney injury on CKD stage - IV  Vs Progression towards CKD 5, GFR is now less than 10, nausea fatigue possible uremia-- --Creatinine is up to 6.34 creatinine was 3.1 on 06/11/2019 and creatinine was 2.1 on 10/09/2018 -renal ultrasound without obstructive uropathy there is evidence of bilateral medical renal disease -Nephrology consult appreciated patient is agreeable to undergo hemodialysis if needed -Stop PTA torsemide, --- start IV Lasix 80 mg twice daily, fluid input and output monitoring - renally adjust medications, avoid nephrotoxic agents / dehydration  / hypotension -PTH pending  2)HFpEF--last known EF 60 to 65% based on echo from January 2020, patient had diastolic dysfunction -IV Lasix 80 mg twice daily, Coreg 6.25 mg twice daily,  3)HTN-stage II--amlodipine discontinued due to edema and to allow room to titrate diuretics, discontinue prazosin, continue Coreg  4)DM2 with peripheral neuropathy--A1c is 5.2 reflecting excellent diabetic control PTA  -Continue Tradjenta, Trulicity  discontinued, decrease Lantus insulin to 16 units nightly due to risk of insulin "stacking" in the setting of worsening renal function -Patient is at risk for hypoglycemia, -Neurontin dose decreased to 100 mg twice daily due to worsening renal function Use Novolog/Humalog Sliding scale insulin with Accu-Cheks/Fingersticks as ordered   5) hypothyroidism--continue levothyroxine 25 mcg daily, TSH was 2.9 in April 2020,  6)Neuropsychiatric disorders---  underlying schizoaffective disorder, as well as questionable seizure attacks in the past, followed by [name] as her neurologist, who does not believe the patient has genuine seizures--- currently on Lamictal for mood stabilization, continue trazodone 50 mg nightly,, Risperdal 0.5 mg twice daily, vitamin B6, Mysoline 50 mg daily, and as needed lorazepam  7)Morbid obesity/OSA--- CPAP nightly if COVID-19 negative  8)Anemia of CKD--- ESA/EPO agent per nephrology team, ferritin and TIBC pending, check stool occult blood  With History of - Reviewed by me  Past Medical History:  Diagnosis Date   Anxiety    Asthma    Back pain    Barrett esophagus    CHF (congestive heart failure) (HCC)    CKD (chronic kidney disease) stage 3, GFR 30-59 ml/min    COPD (chronic obstructive pulmonary disease) ([place])    Depression  Diastolic heart failure (HCC)    Essential hypertension    Fibromyalgia    GERD (gastroesophageal reflux disease)    Gout    History of pericarditis    Hyperlipidemia    Hypothyroid    Major depressive disorder    Memory loss    Nephrolithiasis    2008   Obesity hypoventilation syndrome (HCC)    CPAP   Obstructive sleep apnea    PE (pulmonary embolism)    2010   Peripheral neuropathy    PTSD (post-traumatic stress disorder)    Rheumatoid arthritis ([place])    Schizophrenia ([place])    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 female   HTN, DM (on trulicity), hyperlipidemia, diastolic heart failure,  schizoaffective D/O with episodes of catatonia and sz d/o- on primidone/cymbalta and risperdone.  She also seems to have CKD-  crt in the low to mid 2's in early 2020- then a crt in the system from October 2020 that was 3,  followed by [name] as her neurologist who does not believe the patient has frank seizures --Presents to the ED with worsening shortness of breath, worsening lower extremity edema and decreased urine output despite compliance with diuretics at home -- In the ED chest x-rays consistent with CHF -Creatinine is up to 6.34 creatinine was 3.1 on 06/11/2019 and creatinine was 2.1 on 10/09/2018   Review of systems:    In addition to the HPI above,   A full Review of  Systems was done, all other systems reviewed are negative except as noted above in HPI , .    Social History:  Reviewed by me    Social History   Tobacco Use   Smoking status: Former Smoker    Types: Cigarettes    Quit date: 06/23/1972    Years since quitting: 47.2   Smokeless tobacco: Never Used  Substance Use Topics   Alcohol use: No    Family History :  Reviewed by me    Family History  Problem Relation Age of Onset   Bone cancer Mother    Hypertension Mother    Heart disease Mother    COPD Father    Heart disease Father    Stroke Father      Home Medications:   Prior to Admission medications   Medication Sig Start Date End Date Taking? Authorizing Provider  acetaminophen (TYLENOL)  325 MG tablet Take 325 mg by mouth 2 (two) times daily.    Yes [provider]  albuterol (PROVENTIL) (2.5 MG/3ML) 0.083% nebulizer solution Take 2.5 mg by nebulization every 6 (six) hours as needed for wheezing or shortness of breath.   Yes [provider]  albuterol (VENTOLIN HFA) 108 (90 Base) MCG/ACT inhaler Inhale 1 puff into the lungs every 6 (six) hours as needed for wheezing or shortness of breath.   Yes  [provider]  amLODipine (NORVASC) 5 MG tablet Take 1 tablet (5 mg total) by mouth daily. Patient taking differently: Take 5 mg by mouth every morning.  07/31/18 09/06/19 Yes Ghimire, Henreitta Leber, MD  aspirin 81 MG tablet Take 81 mg by mouth every morning.    Yes [provider]  atorvastatin (LIPITOR) 20 MG tablet Take 1 tablet by mouth at bedtime.  05/14/19  Yes [provider]  calcium carbonate (TUMS - DOSED IN MG ELEMENTAL CALCIUM) 500 MG chewable tablet Chew 1 tablet by mouth daily as needed for indigestion or heartburn.   Yes [provider]  carvedilol (COREG) 6.25 MG tablet Take 1 tablet (6.25 mg total) by mouth 2 (two) times daily with a meal. 08/18/18  Yes Lavina Hamman, MD  Cholecalciferol (VITAMIN D3) 50 MCG (2000 UT) TABS Take 4,000 Units by mouth every morning.    Yes [provider]  Dextromethorphan-guaiFENesin 10-100 MG/5ML liquid Take 5 mLs by mouth daily as needed (cough).   Yes [provider]  docusate sodium (COLACE) 100 MG capsule Take 1 capsule (100 mg total) by mouth 2 (two) times daily. Hold for diarrhea Patient taking differently: Take 100 mg by mouth daily as needed. Hold for diarrhea 11/29/18  Yes Barton Dubois, MD  Dulaglutide (TRULICITY) 1.5 0000000 SOPN Inject 1.5 mg into the skin every Friday.    Yes [provider]  DULoxetine (CYMBALTA) 60 MG capsule Take 1 capsule by mouth daily. 05/11/19  Yes [provider]  EPINEPHrine (EPI-PEN) 0.3 mg/0.3 mL DEVI Inject 0.3 mg into  the muscle once as needed (for bee stings, then go to the ED immediately after using).    Yes [provider]  gabapentin (NEURONTIN) 300 MG capsule Take 1 capsule (300 mg total) by mouth 2 (two) times daily. Patient taking differently: Take 300 mg by mouth 2 (two) times daily. & 600 mg at bedtime 11/29/18  Yes Barton Dubois, MD  hydrOXYzine (ATARAX/VISTARIL) 25 MG tablet Take 25 mg by mouth 2 (two) times daily as needed for anxiety.    Yes [provider]  insulin degludec (TRESIBA FLEXTOUCH) 100 UNIT/ML SOPN FlexTouch Pen Inject 18 Units into the skin at bedtime.    Yes [provider]  levofloxacin (LEVAQUIN) 500 MG tablet Take 500 mg by mouth daily. 09/04/19  Yes [provider]  levothyroxine (SYNTHROID, LEVOTHROID) 25 MCG tablet Take 1 tablet (25 mcg total) by mouth daily at 6 (six) AM. 08/19/18  Yes Lavina Hamman, MD  linagliptin (TRADJENTA) 5 MG TABS tablet Take 5 mg by mouth every morning.    Yes [provider]  LORazepam (ATIVAN) 1 MG tablet Take 1 mg by mouth daily as needed for anxiety.   Yes [provider]  montelukast (SINGULAIR) 10 MG tablet Take 10 mg by mouth at bedtime.   Yes [provider]  Multiple Vitamin (DAILY VITE PO) Take 1 tablet by mouth every morning.    Yes [provider]  omeprazole (PRILOSEC) 20 MG capsule Take 20 mg by mouth every morning.    Yes [provider]  prazosin (MINIPRESS) 2 MG capsule Take 1 capsule by mouth at bedtime. 05/11/19  Yes [provider]  predniSONE (DELTASONE) 10 MG tablet Take 10 mg by mouth at bedtime.   Yes [provider]  primidone (MYSOLINE) 50 MG tablet Take 50 mg by mouth every morning.   Yes [provider]  pyridOXINE (VITAMIN B-6) 100 MG tablet Take 200 mg  by mouth 2 (two) times daily.   Yes [provider]  risperiDONE (RISPERDAL) 0.5 MG tablet Take 1 tablet by mouth 2 (two) times daily. 05/11/19  Yes [provider]  sodium phosphate (FLEET) 7-19 GM/118ML ENEM Place 1 enema rectally daily as needed for mild constipation or severe constipation.   Yes [provider]  torsemide (DEMADEX) 20 MG tablet TAKE 3 TABLETS (60mg ) BY MOUTH EVERY OTHER DAY; ALTERNATING WITH 2 TABLETS (40mg ) BY MOUTH EVERY OTHER DAY. 05/14/19  Yes Strader, [place] M, PA-C  traMADol (ULTRAM) 50 MG tablet Take 1 tablet by mouth every 6 (six) hours as needed for moderate pain.  03/29/19  Yes [provider]  traZODone (DESYREL) 50 MG tablet Take 1 tablet by mouth daily. 05/11/19  Yes [provider]  Vitamins A & D (VITAMIN A & D) ointment Apply 1 application topically See admin instructions. To be applied after baths in the morning due to rash under skin folds   Yes [provider]     Allergies:     Allergies  Allergen Reactions   Benztropine Mesylate Other (See Comments)    Alopecia and white spots on eyes   Codeine Swelling, Palpitations and Other (See Comments)    Mouth Swelling and Tachycardia    Diazepam Other (See Comments)    COMA   Diphenhydramine Hcl Anxiety, Palpitations and Other (See Comments)    Tachycardia and anxiousness   Penicillins Swelling and Other (See Comments)    REACTION: ulcers in mouth, swelling, throat swelling  Has patient had a PCN reaction causing immediate rash, facial/tongue/throat swelling, SOB or lightheadedness with hypotension: YES Has patient had a PCN reaction causing severe rash involving Mucus membranes or skin necrosis: Unknown Has patient had a PCN reaction that required hospitalization Unknown Has patient had a PCN reaction occurring within the last 10 years: unknown If all of the above answers are "NO", then may proceed with Cephalospori   Sulfonamide Derivatives Rash    Blisters, also   Thioridazine Hcl Swelling   Lisinopril Cough    Physical Exam:   Vitals  Blood pressure (!) 153/84, pulse 82, temperature 98.7 F (37.1  C), temperature source Oral, resp. rate 18, height 5' (1.524 m), weight 104.3 kg, SpO2 96 %.  Physical Examination: General appearance - alert, well appearing, patient with dyspnea on exertion  mental status - alert, oriented to person, place, and time,  Eyes - sclera anicteric Neck - supple, no JVD elevation , Chest -diminished in bases, faint bibasilar rales Heart - S1 and S2 normal, regular  Abdomen - soft, nontender, nondistended, increased truncal adiposity (+ve anterior abdominal wall edema) neurological - screening mental status exam normal, neck supple without rigidity, cranial nerves II through XII intact, DTR's normal and symmetric Extremities -+3 pedal edema noted, intact peripheral pulses  Skin - warm, dry     Data Review:    CBC Recent Labs  Lab 09/06/19 0343  WBC 5.9  HGB 7.5*  HCT 22.9*  PLT 155  MCV 100.0  MCH 32.8  MCHC 32.8  RDW 13.2  LYMPHSABS 0.5*  MONOABS 0.3  EOSABS 0.0  BASOSABS 0.0   ------------------------------------------------------------------------------------------------------------------  Chemistries  Recent Labs  Lab 09/06/19 0343  NA 142  K 4.0  CL 104  CO2 26  GLUCOSE 148*  BUN 62*  CREATININE 6.34*  CALCIUM 8.2*  AST 19  ALT 20  ALKPHOS 73  BILITOT 0.5   ------------------------------------------------------------------------------------------------------------------ estimated creatinine clearance is 9.4 mL/min (A) (by  C-G formula based on SCr of 6.34 mg/dL (H)). ------------------------------------------------------------------------------------------------------------------ No results for input(s): TSH, T4TOTAL, T3FREE, THYROIDAB in the last 72 hours.  Invalid input(s): FREET3   Coagulation profile No results for input(s): INR, PROTIME in the last 168 hours. ------------------------------------------------------------------------------------------------------------------- Recent Labs    09/06/19 0343  DDIMER  2.58*   -------------------------------------------------------------------------------------------------------------------  Cardiac Enzymes No results for input(s): CKMB, TROPONINI, MYOGLOBIN in the last 168 hours.  Invalid input(s): CK ------------------------------------------------------------------------------------------------------------------    Component Value Date/Time   BNP 1,113.0 (H) 09/06/2019 0343     ---------------------------------------------------------------------------------------------------------------  Urinalysis    Component Value Date/Time   COLORURINE YELLOW 09/06/2019 1500   APPEARANCEUR CLEAR 09/06/2019 1500   LABSPEC 1.015 09/06/2019 1500   PHURINE 6.0 09/06/2019 1500   GLUCOSEU >=500 (A) 09/06/2019 1500   HGBUR NEGATIVE 09/06/2019 1500   HGBUR negative 10/20/2009 1425   BILIRUBINUR NEGATIVE 09/06/2019 1500   KETONESUR NEGATIVE 09/06/2019 1500   PROTEINUR >=300 (A) 09/06/2019 1500   UROBILINOGEN 0.2 10/05/2011 1002   NITRITE NEGATIVE 09/06/2019 1500   LEUKOCYTESUR NEGATIVE 09/06/2019 1500    ----------------------------------------------------------------------------------------------------------------   Imaging Results:    US RENAL  Result Date: 09/06/2019 CLINICAL DATA:  Acute kidney injury EXAM: RENAL / URINARY TRACT ULTRASOUND COMPLETE COMPARISON:  CT abdomen pelvis 06/01/2018 FINDINGS: Right Kidney: Renal measurements: 8.2 x 4.5 x 5.3 cm = volume: 102 mL. Poor visualization of RIGHT kidney due to body habitus. Increased cortical echogenicity. Cortical thinning. No gross evidence of mass or hydronephrosis on limited assessment Left Kidney: Renal measurements: 9.4 x 4.5 x 5.3 cm = volume: 130 mL. Cortical thinning with increased cortical echogenicity. No mass or hydronephrosis. Bladder: Well distended, unremarkable.  Patient was unable to void. Other: N/A IMPRESSION: Limited visualization of RIGHT kidney due to body habitus. BILATERAL renal  cortical atrophy and medical renal disease changes. No gross evidence of mass or hydronephrosis within limitations of exam. Electronically Signed   By: Lavonia Dana M.D.   On: 09/06/2019 10:47   DG Chest Port 1 View  Result Date: 09/06/2019 CLINICAL DATA:  Shortness of breath. EXAM: PORTABLE CHEST 1 VIEW COMPARISON:  One-view chest x-ray 08/29/2018 FINDINGS: Heart is enlarged. Mild interstitial opacities slightly more prominent than on the prior study. Heart size and interstitial pattern is exaggerated by low lung volumes. No significant airspace consolidation is present. No significant effusions are present. IMPRESSION: 1. Cardiomegaly with mild interstitial edema suggesting congestive heart failure. 2. Low lung volumes. Electronically Signed   By: San Morelle M.D.   On: 09/06/2019 04:29    Radiological Exams on Admission: US RENAL  Result Date: 09/06/2019 CLINICAL DATA:  Acute kidney injury EXAM: RENAL / URINARY TRACT ULTRASOUND COMPLETE COMPARISON:  CT abdomen pelvis 06/01/2018 FINDINGS: Right Kidney: Renal measurements: 8.2 x 4.5 x 5.3 cm = volume: 102 mL. Poor visualization of RIGHT kidney due to body habitus. Increased cortical echogenicity. Cortical thinning. No gross evidence of mass or hydronephrosis on limited assessment Left Kidney: Renal measurements: 9.4 x 4.5 x 5.3 cm = volume: 130 mL. Cortical thinning with increased cortical echogenicity. No mass or hydronephrosis. Bladder: Well distended, unremarkable.  Patient was unable to void. Other: N/A IMPRESSION: Limited visualization of RIGHT kidney due to body habitus. BILATERAL renal cortical atrophy and medical renal disease changes. No gross evidence of mass or hydronephrosis within limitations of exam. Electronically Signed   By: Lavonia Dana M.D.   On: 09/06/2019 10:47   DG Chest Port 1 View  Result Date: 09/06/2019 CLINICAL DATA:  Shortness of  breath. EXAM: PORTABLE CHEST 1 VIEW COMPARISON:  One-view chest x-ray 08/29/2018  FINDINGS: Heart is enlarged. Mild interstitial opacities slightly more prominent than on the prior study. Heart size and interstitial pattern is exaggerated by low lung volumes. No significant airspace consolidation is present. No significant effusions are present. IMPRESSION: 1. Cardiomegaly with mild interstitial edema suggesting congestive heart failure. 2. Low lung volumes. Electronically Signed   By: San Morelle M.D.   On: 09/06/2019 04:29    DVT Prophylaxis -SCD /heparin AM Labs Ordered, also please review Full Orders  Family Communication: Admission, patients condition and plan of care including tests being ordered have been discussed with the patient  who indicate understanding and agree with the plan   Code Status - Full Code  Likely DC to  TBD  Condition   stable  Roxan Hockey M.D on 09/06/2019 at 4:46 PM Go to [email or web address] -  for contact info  [facility] - Office  [phone]

## 2019-09-06 NOTE — Plan of Care (Signed)
Plan of care reviewed by RN and with patient.

## 2019-09-07 DIAGNOSIS — N189 Chronic kidney disease, unspecified: Secondary | ICD-10-CM | POA: Diagnosis not present

## 2019-09-07 DIAGNOSIS — N179 Acute kidney failure, unspecified: Secondary | ICD-10-CM | POA: Diagnosis not present

## 2019-09-07 DIAGNOSIS — R0602 Shortness of breath: Secondary | ICD-10-CM | POA: Diagnosis not present

## 2019-09-07 DIAGNOSIS — E8779 Other fluid overload: Secondary | ICD-10-CM | POA: Diagnosis not present

## 2019-09-07 LAB — CBC
HCT: 22.1 % — ABNORMAL LOW (ref 36.0–46.0)
Hemoglobin: 7.2 g/dL — ABNORMAL LOW (ref 12.0–15.0)
MCH: 32.7 pg (ref 26.0–34.0)
MCHC: 32.6 g/dL (ref 30.0–36.0)
MCV: 100.5 fL — ABNORMAL HIGH (ref 80.0–100.0)
Platelets: 159 10*3/uL (ref 150–400)
RBC: 2.2 MIL/uL — ABNORMAL LOW (ref 3.87–5.11)
RDW: 13.8 % (ref 11.5–15.5)
WBC: 6.7 10*3/uL (ref 4.0–10.5)
nRBC: 0 % (ref 0.0–0.2)

## 2019-09-07 LAB — RENAL FUNCTION PANEL
Albumin: 2.5 g/dL — ABNORMAL LOW (ref 3.5–5.0)
Anion gap: 12 (ref 5–15)
BUN: 64 mg/dL — ABNORMAL HIGH (ref 8–23)
CO2: 27 mmol/L (ref 22–32)
Calcium: 7.9 mg/dL — ABNORMAL LOW (ref 8.9–10.3)
Chloride: 101 mmol/L (ref 98–111)
Creatinine, Ser: 6.47 mg/dL — ABNORMAL HIGH (ref 0.44–1.00)
GFR calc Af Amer: 7 mL/min — ABNORMAL LOW (ref >60)
GFR calc non Af Amer: 6 mL/min — ABNORMAL LOW (ref >60)
Glucose, Bld: 104 mg/dL — ABNORMAL HIGH (ref 70–99)
Phosphorus: 6.9 mg/dL — ABNORMAL HIGH (ref 2.5–4.6)
Potassium: 3.6 mmol/L (ref 3.5–5.1)
Sodium: 140 mmol/L (ref 135–145)

## 2019-09-07 LAB — IRON AND TIBC
Iron: 62 ug/dL (ref 28–170)
Saturation Ratios: 26 % (ref 10.4–31.8)
TIBC: 241 ug/dL — ABNORMAL LOW (ref 250–450)
UIBC: 179 ug/dL

## 2019-09-07 LAB — GLUCOSE, CAPILLARY
Glucose-Capillary: 90 mg/dL (ref 70–99)
Glucose-Capillary: 163 mg/dL — ABNORMAL HIGH (ref 70–99)
Glucose-Capillary: 112 mg/dL — ABNORMAL HIGH (ref 70–99)
Glucose-Capillary: 182 mg/dL — ABNORMAL HIGH (ref 70–99)

## 2019-09-07 LAB — FERRITIN: Ferritin: 19 ng/mL (ref 11–307)

## 2019-09-07 MED ORDER — FUROSEMIDE 10 MG/ML IJ SOLN
80.0000 mg | Freq: Three times a day (TID) | INTRAMUSCULAR | Status: DC
  Administered 2019-09-07 – 2019-09-08 (×3): 80 mg via INTRAVENOUS
  Filled 2019-09-07 (×3): qty 8

## 2019-09-07 NOTE — Progress Notes (Signed)
At 1645, pt up to chair with only use of walker and standby assistance. Pt steady on feet. Tolerated well with no increased SOB or c/o dyspnea. States uses walker at residence and prefers to be in the chair instead of laying in the bed all day.

## 2019-09-07 NOTE — Progress Notes (Signed)
[facility] ROUNDING NOTE   Subjective:   This is a 69 year old lady with a history of hypertension diabetes hyperlipidemia diastolic heart failure and schizoaffective disorder baseline creatinine in early 2020 of mid 2 milligrams per deciliter.  She is a resident of [facility] and was brought to the emergency room with increasing shortness of breath and swelling.  There is also an episode of presyncope which is being evaluated by her primary physicians.  She was noted to have mild interstitial edema and a creatinine of 6.  She is being diuresed with Lasix 80 mg twice daily IV.  Blood pressure 136/70 pulse 81 temperature 98 O2 sats 95% room air urine output 800 cc 09/06/2019   Sodium 140 potassium 3.7 chloride 101 CO2 27 BUN 64 creatinine 6.47 calcium 7.9 phosphorus 6.9 albumin 2.5  iron saturations 26.  WBC 6.7 hemoglobin 7.2 platelets 159  Aspirin 81 mg daily atorvastatin 20 mg daily Coreg 6.25 mg twice daily vitamin D 4000 units daily darbepoetin 150 mcg administered 09/06/2019, Cymbalta 60 mg daily Lasix 80 mg IV every 12 hours Neurontin 100 mg twice daily, insulin sliding scale, Lantus 16 units daily, levothyroxine 25 mcg daily, Tradjenta 5 mg daily, Singulair 10 mg daily multivitamins 1 daily Protonix 40 mg daily, primidone 50 mg daily pyridoxine 200 mg twice daily Risperdal 0.5 mg twice daily trazodone 50 mg daily  Renal ultrasound showed limited visualization of right kidney due to body habitus bilateral renal cortical atrophy significant for renal medical renal disease no gross evidence of mass or hydronephrosis with limitations of examination.  Chest x-ray cardiomegaly with mild central edema low lung volumes 09/06/2019.    Urinalysis showed greater than 500 sugar greater than 300 protein.  It was negative for RBCs negative for WBCs.  Objective:  Vital signs in last 24 hours:  Temp:  [98 F (36.7 C)-98.7 F (37.1 C)] 98 F (36.7 C) (01/22 0507) Pulse Rate:  [81-94]  81 (01/22 0507) Resp:  [18-40] 18 (01/21 1558) BP: (132-176)/(18-99) 156/70 (01/22 0507) SpO2:  [92 %-98 %] 95 % (01/22 0507) Weight:  [104.3 kg-104.8 kg] 104.8 kg (01/22 0507)  Weight change:  Filed Weights   09/06/19 1627 09/07/19 0507  Weight: 104.3 kg 104.8 kg    Intake/Output: I/O last 3 completed shifts: In: 493 [P.O.:490; I.V.:3] Out: 900 [Urine:900]   Intake/Output this shift:  No intake/output data recorded.  Alert pleasant simple conversation [facility]- RRR no murmurs rubs or gallops RS-diminished breath sounds with fine bibasilar rales ABD- BS present soft non-distended EXT-3+ lower extremity edema   Basic Metabolic Panel: Recent Labs  Lab 09/06/19 0343 09/07/19 0532  NA 142 140  K 4.0 3.6  CL 104 101  CO2 26 27  GLUCOSE 148* 104*  BUN 62* 64*  CREATININE 6.34* 6.47*  CALCIUM 8.2* 7.9*  PHOS  --  6.9*    Liver Function Tests: Recent Labs  Lab 09/06/19 0343 09/07/19 0532  AST 19  --   ALT 20  --   ALKPHOS 73  --   BILITOT 0.5  --   PROT 5.8*  --   ALBUMIN 2.6* 2.5*   No results for input(s): LIPASE, AMYLASE in the last 168 hours. No results for input(s): AMMONIA in the last 168 hours.  CBC: Recent Labs  Lab 09/06/19 0343 09/07/19 0532  WBC 5.9 6.7  NEUTROABS 5.1  --   HGB 7.5* 7.2*  HCT 22.9* 22.1*  MCV 100.0 100.5*  PLT 155 159    Cardiac  Enzymes: No results for input(s): CKTOTAL, CKMB, CKMBINDEX, TROPONINI in the last 168 hours.  BNP: Invalid input(s): POCBNP  CBG: Recent Labs  Lab 09/06/19 1158 09/06/19 1612 09/06/19 2133 09/07/19 0802  GLUCAP 197* 145* 147* 90    Microbiology: Results for orders placed or performed during the hospital encounter of 09/06/19  SARS CORONAVIRUS 2 (TAT 6-24 HRS) Nasopharyngeal Nasopharyngeal Swab     Status: None   Collection Time: 09/06/19  3:43 AM   Specimen: Nasopharyngeal Swab  Result Value Ref Range Status   SARS Coronavirus 2 NEGATIVE NEGATIVE Final    Comment: (NOTE) SARS-CoV-2  target nucleic acids are NOT DETECTED. The SARS-CoV-2 RNA is generally detectable in upper and lower respiratory specimens during the acute phase of infection. Negative results do not preclude SARS-CoV-2 infection, do not rule out co-infections with other pathogens, and should not be used as the sole basis for treatment or other patient management decisions. Negative results must be combined with clinical observations, patient history, and epidemiological information. The expected result is Negative. Fact Sheet for Patients: SugarRoll.be Fact Sheet for Healthcare Providers: https://www.woods-mathews.com/ This test is not yet approved or cleared by the [place] FDA and  has been authorized for detection and/or diagnosis of SARS-CoV-2 by FDA under an Emergency Use Authorization (EUA). This EUA will remain  in effect (meaning this test can be used) for the duration of the COVID-19 declaration under Section 56 4(b)(1) of the Act, 21 U.S.C. section 360bbb-3(b)(1), unless the authorization is terminated or revoked sooner. Performed at [facility], [place] [address] [place], [place] [address]     Coagulation Studies: No results for input(s): LABPROT, INR in the last 72 hours.  Urinalysis: Recent Labs    09/06/19 1500  COLORURINE YELLOW  LABSPEC 1.015  PHURINE 6.0  GLUCOSEU >=500*  HGBUR NEGATIVE  BILIRUBINUR NEGATIVE  KETONESUR NEGATIVE  PROTEINUR >=300*  NITRITE NEGATIVE  LEUKOCYTESUR NEGATIVE      Imaging: US RENAL  Result Date: 09/06/2019 CLINICAL DATA:  Acute kidney injury EXAM: RENAL / URINARY TRACT ULTRASOUND COMPLETE COMPARISON:  CT abdomen pelvis 06/01/2018 FINDINGS: Right Kidney: Renal measurements: 8.2 x 4.5 x 5.3 cm = volume: 102 mL. Poor visualization of RIGHT kidney due to body habitus. Increased cortical echogenicity. Cortical thinning. No gross evidence of mass or hydronephrosis on limited assessment Left  Kidney: Renal measurements: 9.4 x 4.5 x 5.3 cm = volume: 130 mL. Cortical thinning with increased cortical echogenicity. No mass or hydronephrosis. Bladder: Well distended, unremarkable.  Patient was unable to void. Other: N/A IMPRESSION: Limited visualization of RIGHT kidney due to body habitus. BILATERAL renal cortical atrophy and medical renal disease changes. No gross evidence of mass or hydronephrosis within limitations of exam. Electronically Signed   By: Lavonia Dana M.D.   On: 09/06/2019 10:47   DG Chest Port 1 View  Result Date: 09/06/2019 CLINICAL DATA:  Shortness of breath. EXAM: PORTABLE CHEST 1 VIEW COMPARISON:  One-view chest x-ray 08/29/2018 FINDINGS: Heart is enlarged. Mild interstitial opacities slightly more prominent than on the prior study. Heart size and interstitial pattern is exaggerated by low lung volumes. No significant airspace consolidation is present. No significant effusions are present. IMPRESSION: 1. Cardiomegaly with mild interstitial edema suggesting congestive heart failure. 2. Low lung volumes. Electronically Signed   By: San Morelle M.D.   On: 09/06/2019 04:29     Medications:   . sodium chloride     . acetaminophen  325 mg Oral BID  . aspirin  81  mg Oral q morning - 10a  . atorvastatin  20 mg Oral Daily  . carvedilol  6.25 mg Oral BID WC  . cholecalciferol  4,000 Units Oral q morning - 10a  . darbepoetin (ARANESP) injection - NON-DIALYSIS  150 mcg Subcutaneous Q Thu-1800  . DULoxetine  60 mg Oral Daily  . furosemide  80 mg Intravenous Q12H  . gabapentin  100 mg Oral BID  . heparin  5,000 Units Subcutaneous Q8H  . influenza vaccine adjuvanted  0.5 mL Intramuscular Tomorrow-1000  . insulin aspart  0-15 Units Subcutaneous TID WC  . insulin aspart  0-5 Units Subcutaneous QHS  . insulin glargine  16 Units Subcutaneous QHS  . levothyroxine  25 mcg Oral Q0600  . linagliptin  5 mg Oral q morning - 10a  . montelukast  10 mg Oral QHS  . multivitamin  with minerals  1 tablet Oral q morning - 10a  . neomycin-bacitracin-polymyxin  1 application Topical Daily  . pantoprazole  40 mg Oral Daily  . primidone  50 mg Oral q morning - 10a  . pyridOXINE  200 mg Oral BID  . risperiDONE  0.5 mg Oral BID  . sodium chloride flush  3 mL Intravenous Q12H  . traZODone  50 mg Oral Daily   sodium chloride, acetaminophen **OR** acetaminophen, albuterol, albuterol, hydrOXYzine, labetalol, LORazepam, ondansetron **OR** ondansetron (ZOFRAN) IV, polyethylene glycol, sodium chloride flush  Assessment/ Plan:   Chronic kidney disease probably stage V with progression secondary to diabetic nephropathy.  It may be worthwhile sending serological evaluation for serum plateletpheresis and urine protein electrophoresis.  There were no obvious need for dialysis at this particular time.  There is no evidence of acute incision for his acute glomerulonephritis based on urinalysis.  Renal ultrasound was suggestive of chronic disease.  We will need dialysis planning and education on discharge.  She also needs to see vein and vascular surgery for placement of AV fistula I doubt that she would be a candidate for home dialysis therapy.  Do not think it is necessary for the patient for kidney options class.  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 Denton Brick   Hypothyroidism on replacement therapy  Schizophrenia appears to be stable on current doses of medication  Diastolic heart failure with diastolic dysfunction continue diuresis agree with Coreg 6.25 mg twice daily   LOS: 1 Sherril Croon @TODAY @9 :02 AM

## 2019-09-07 NOTE — Progress Notes (Signed)
Patient Demographics:    [name], is a 69 y.o. female, DOB - 09/18/1950, [id number]  Admit date - 09/06/2019   Admitting Physician Katreena Schupp Denton Brick, MD  Outpatient Primary MD for the patient is Glenda Chroman, MD  LOS - 1   Chief Complaint  Patient presents with  . Shortness of Breath        Subjective:    [name] today has no fevers,  No chest pain,  -voiding okay Shortness of breath improving dyspnea on exertion persist, Nausea but no emesis  Assessment  & Plan :    Principal Problem:   Acute kidney injury superimposed on chronic kidney disease (HCC) Active Problems:   Volume overload   Type 2 diabetes mellitus with diabetic polyneuropathy, without long-term current use of insulin (HCC)   Normocytic anemia   Schizophrenia (HCC)   Depression   Essential hypertension   Chronic diastolic heart failure (HCC)   CKD (chronic kidney disease), stage IV (HCC)   Obesity, Class III, BMI 40-49.9 (morbid obesity) ([place])   Type 2 diabetes mellitus (HCC)   Hypothyroidism  Brief summary 69 y.o. female   HTN, DM (on trulicity), hyperlipidemia, diastolic heart failure, schizoaffective D/O with episodes of catatonia and sz d/o- on primidone/cymbalta and risperdone. She also seems to have CKD- crt in the low to mid 2's in early 2020- then a crt in the system from October 2020 that was 3-admitted on 09/06/2019 with AKI on CKD versus progression of CKD with creatinine of 6.3 from a baseline of 3.1  A/p 11)AKI----acute kidney injury on CKD stage - IV  Versus Progression towards CKD 5, GFR is now less than 10, nausea, fatigue possible uremia-- --Creatinine is up to 6.47, creatinine was 3.1 on 06/11/2019 and creatinine was 2.1 on 10/09/2018 -renal ultrasound without obstructive uropathy there is evidence of bilateral medical renal disease -Nephrology consult appreciated patient is agreeable to  undergo hemodialysis if needed -Stop PTA torsemide, ---  Increase IV Lasix 80 mg every 8 hours, fluid input and output monitoring -Weight chart and fluid balance documentation appears inaccurate for the last 24 hours (weight is documented as being up to 231 Lbs from 229.9 pounds - renally adjust medications, avoid nephrotoxic agents / dehydration  / hypotension -PTH pending  2)HFpEF--last known EF 60 to 65% based on echo from January 2020, patient had diastolic dysfunction -IV Lasix as above #1 Coreg 6.25 mg twice daily,  3)HTN-stage II--amlodipine discontinued due to edema and to allow room to titrate diuretics, discontinue prazosin, continue Coreg  4)DM2 with peripheral neuropathy--A1c is 5.2 reflecting excellent diabetic control PTA  -Continue Tradjenta, Trulicity discontinued, decrease Lantus insulin to 16 units nightly due to risk of insulin "stacking" in the setting of worsening renal function -Patient is at risk for hypoglycemia, -Neurontin dose decreased to 100 mg twice daily due to worsening renal function Use Novolog/Humalog Sliding scale insulin with Accu-Cheks/Fingersticks as ordered   5) hypothyroidism--continue levothyroxine 25 mcg daily, TSH was 2.9 in April 2020,  6)Neuropsychiatric disorders--- underlying schizoaffective disorder, as well as questionable seizure attacks in the past, followed by [name] as her neurologist, who does not believe the patient has genuine seizures--- currently on Lamictal for mood stabilization, continue trazodone 50 mg nightly,, Risperdal 0.5  mg twice daily, vitamin B6, Mysoline 50 mg daily, and as needed lorazepam  7)Morbid obesity/OSA--- CPAP nightly   8)Anemia of CKD--- ESA/EPO agent per nephrology team,  ferritin 19 and TIBC 241 and serum iron is 62, check stool occult blood  Disposition/Need for in-Hospital Stay- patient unable to be discharged at this time due to --significant edema and worsening renal function requiring  IV diuresis  Code Status : Full  Family Communication:   (patient is alert, awake and coherent)   Disposition Plan  : TBD  Consults  :  Neprhology  DVT Prophylaxis  :   - Heparin - SCDs  Lab Results  Component Value Date   PLT 159 09/07/2019    Inpatient Medications  Scheduled Meds: . acetaminophen  325 mg Oral BID  . aspirin  81 mg Oral q morning - 10a  . atorvastatin  20 mg Oral Daily  . carvedilol  6.25 mg Oral BID WC  . cholecalciferol  4,000 Units Oral q morning - 10a  . darbepoetin (ARANESP) injection - NON-DIALYSIS  150 mcg Subcutaneous Q Thu-1800  . DULoxetine  60 mg Oral Daily  . furosemide  80 mg Intravenous Q8H  . gabapentin  100 mg Oral BID  . heparin  5,000 Units Subcutaneous Q8H  . influenza vaccine adjuvanted  0.5 mL Intramuscular Tomorrow-1000  . insulin aspart  0-15 Units Subcutaneous TID WC  . insulin aspart  0-5 Units Subcutaneous QHS  . insulin glargine  16 Units Subcutaneous QHS  . levothyroxine  25 mcg Oral Q0600  . linagliptin  5 mg Oral q morning - 10a  . montelukast  10 mg Oral QHS  . multivitamin with minerals  1 tablet Oral q morning - 10a  . neomycin-bacitracin-polymyxin  1 application Topical Daily  . pantoprazole  40 mg Oral Daily  . primidone  50 mg Oral q morning - 10a  . pyridOXINE  200 mg Oral BID  . risperiDONE  0.5 mg Oral BID  . sodium chloride flush  3 mL Intravenous Q12H  . traZODone  50 mg Oral Daily   Continuous Infusions: . sodium chloride     PRN Meds:.sodium chloride, acetaminophen **OR** acetaminophen, albuterol, albuterol, hydrOXYzine, labetalol, LORazepam, ondansetron **OR** ondansetron (ZOFRAN) IV, polyethylene glycol, sodium chloride flush    Anti-infectives (From admission, onward)   None        Objective:   Vitals:   09/06/19 1946 09/06/19 2200 09/07/19 0507 09/07/19 1252  BP:  (!) 158/73 (!) 156/70   Pulse:  81 81   Resp:      Temp:  98.5 F (36.9 C) 98 F (36.7 C)   TempSrc:  Oral Oral   SpO2:  92% 96% 95% 92%  Weight:   104.8 kg   Height:   5' (1.524 m)     Wt Readings from Last 3 Encounters:  09/07/19 104.8 kg  06/06/19 90.3 kg  11/28/18 96.1 kg     Intake/Output Summary (Last 24 hours) at 09/07/2019 1938 Last data filed at 09/07/2019 1600 Gross per 24 hour  Intake 736 ml  Output 1200 ml  Net -464 ml     Physical Exam  Gen:- Awake Alert, morbidly obese, no conversational dyspnea HEENT:- [place].AT, No sclera icterus Neck-Supple Neck, +ve JVD,.  Lungs-diminished in bases, no wheezing  CV- S1, S2 normal, regular  Abd-  +ve B.Sounds, Abd Soft, No tenderness, increased truncal adiposity Extremity/Skin:-3+ edema, pedal pulses present , knee-high teds Psych-affect is appropriate, oriented x3 Neuro-generalized weakness,no new  focal deficits, no tremors   Data Review:   Micro Results Recent Results (from the past 240 hour(s))  SARS CORONAVIRUS 2 (TAT 6-24 HRS) Nasopharyngeal Nasopharyngeal Swab     Status: None   Collection Time: 09/06/19  3:43 AM   Specimen: Nasopharyngeal Swab  Result Value Ref Range Status   SARS Coronavirus 2 NEGATIVE NEGATIVE Final    Comment: (NOTE) SARS-CoV-2 target nucleic acids are NOT DETECTED. The SARS-CoV-2 RNA is generally detectable in upper and lower respiratory specimens during the acute phase of infection. Negative results do not preclude SARS-CoV-2 infection, do not rule out co-infections with other pathogens, and should not be used as the sole basis for treatment or other patient management decisions. Negative results must be combined with clinical observations, patient history, and epidemiological information. The expected result is Negative. Fact Sheet for Patients: SugarRoll.be Fact Sheet for Healthcare Providers: https://www.woods-mathews.com/ This test is not yet approved or cleared by the [place] FDA and  has been authorized for detection and/or diagnosis of SARS-CoV-2 by FDA  under an Emergency Use Authorization (EUA). This EUA will remain  in effect (meaning this test can be used) for the duration of the COVID-19 declaration under Section 56 4(b)(1) of the Act, 21 U.S.C. section 360bbb-3(b)(1), unless the authorization is terminated or revoked sooner. Performed at [facility], [place] [address], [place], [place] [address]     Radiology Reports US RENAL  Result Date: 09/06/2019 CLINICAL DATA:  Acute kidney injury EXAM: RENAL / URINARY TRACT ULTRASOUND COMPLETE COMPARISON:  CT abdomen pelvis 06/01/2018 FINDINGS: Right Kidney: Renal measurements: 8.2 x 4.5 x 5.3 cm = volume: 102 mL. Poor visualization of RIGHT kidney due to body habitus. Increased cortical echogenicity. Cortical thinning. No gross evidence of mass or hydronephrosis on limited assessment Left Kidney: Renal measurements: 9.4 x 4.5 x 5.3 cm = volume: 130 mL. Cortical thinning with increased cortical echogenicity. No mass or hydronephrosis. Bladder: Well distended, unremarkable.  Patient was unable to void. Other: N/A IMPRESSION: Limited visualization of RIGHT kidney due to body habitus. BILATERAL renal cortical atrophy and medical renal disease changes. No gross evidence of mass or hydronephrosis within limitations of exam. Electronically Signed   By: Lavonia Dana M.D.   On: 09/06/2019 10:47   DG Chest Port 1 View  Result Date: 09/06/2019 CLINICAL DATA:  Shortness of breath. EXAM: PORTABLE CHEST 1 VIEW COMPARISON:  One-view chest x-ray 08/29/2018 FINDINGS: Heart is enlarged. Mild interstitial opacities slightly more prominent than on the prior study. Heart size and interstitial pattern is exaggerated by low lung volumes. No significant airspace consolidation is present. No significant effusions are present. IMPRESSION: 1. Cardiomegaly with mild interstitial edema suggesting congestive heart failure. 2. Low lung volumes. Electronically Signed   By: San Morelle M.D.   On: 09/06/2019 04:29      CBC Recent Labs  Lab 09/06/19 0343 09/07/19 0532  WBC 5.9 6.7  HGB 7.5* 7.2*  HCT 22.9* 22.1*  PLT 155 159  MCV 100.0 100.5*  MCH 32.8 32.7  MCHC 32.8 32.6  RDW 13.2 13.8  LYMPHSABS 0.5*  --   MONOABS 0.3  --   EOSABS 0.0  --   BASOSABS 0.0  --     Chemistries  Recent Labs  Lab 09/06/19 0343 09/07/19 0532  NA 142 140  K 4.0 3.6  CL 104 101  CO2 26 27  GLUCOSE 148* 104*  BUN 62* 64*  CREATININE 6.34* 6.47*  CALCIUM 8.2* 7.9*  AST 19  --  ALT 20  --   ALKPHOS 73  --   BILITOT 0.5  --    ------------------------------------------------------------------------------------------------------------------ No results for input(s): CHOL, HDL, LDLCALC, TRIG, CHOLHDL, LDLDIRECT in the last 72 hours.  Lab Results  Component Value Date   HGBA1C 5.2 09/06/2019   ------------------------------------------------------------------------------------------------------------------ No results for input(s): TSH, T4TOTAL, T3FREE, THYROIDAB in the last 72 hours.  Invalid input(s): FREET3 ------------------------------------------------------------------------------------------------------------------ Recent Labs    09/07/19 0532  FERRITIN 19  TIBC 241*  IRON 62    Coagulation profile No results for input(s): INR, PROTIME in the last 168 hours.  Recent Labs    09/06/19 0343  DDIMER 2.58*    Cardiac Enzymes No results for input(s): CKMB, TROPONINI, MYOGLOBIN in the last 168 hours.  Invalid input(s): CK ------------------------------------------------------------------------------------------------------------------    Component Value Date/Time   BNP 1,113.0 (H) 09/06/2019 [id number]     Roxan Hockey M.D on 09/07/2019 at 7:38 PM  Go to [email or web address] - for contact info  [facility] - Office  [phone]

## 2019-09-08 ENCOUNTER — Inpatient Hospital Stay (HOSPITAL_COMMUNITY): Payer: Medicare Other

## 2019-09-08 ENCOUNTER — Inpatient Hospital Stay (HOSPITAL_COMMUNITY): Payer: Medicare Other | Admitting: Anesthesiology

## 2019-09-08 DIAGNOSIS — J9601 Acute respiratory failure with hypoxia: Secondary | ICD-10-CM | POA: Diagnosis not present

## 2019-09-08 DIAGNOSIS — N184 Chronic kidney disease, stage 4 (severe): Secondary | ICD-10-CM | POA: Diagnosis not present

## 2019-09-08 DIAGNOSIS — N179 Acute kidney failure, unspecified: Secondary | ICD-10-CM | POA: Diagnosis not present

## 2019-09-08 DIAGNOSIS — E8779 Other fluid overload: Secondary | ICD-10-CM | POA: Diagnosis not present

## 2019-09-08 DIAGNOSIS — I5032 Chronic diastolic (congestive) heart failure: Secondary | ICD-10-CM | POA: Diagnosis not present

## 2019-09-08 DIAGNOSIS — G9341 Metabolic encephalopathy: Secondary | ICD-10-CM | POA: Diagnosis present

## 2019-09-08 DIAGNOSIS — J9602 Acute respiratory failure with hypercapnia: Secondary | ICD-10-CM | POA: Diagnosis not present

## 2019-09-08 DIAGNOSIS — Z9911 Dependence on respirator [ventilator] status: Secondary | ICD-10-CM

## 2019-09-08 DIAGNOSIS — J810 Acute pulmonary edema: Secondary | ICD-10-CM | POA: Diagnosis not present

## 2019-09-08 DIAGNOSIS — I10 Essential (primary) hypertension: Secondary | ICD-10-CM

## 2019-09-08 DIAGNOSIS — R0602 Shortness of breath: Secondary | ICD-10-CM | POA: Diagnosis not present

## 2019-09-08 DIAGNOSIS — Z20822 Contact with and (suspected) exposure to covid-19: Secondary | ICD-10-CM | POA: Diagnosis not present

## 2019-09-08 LAB — RENAL FUNCTION PANEL
Albumin: 2.5 g/dL — ABNORMAL LOW (ref 3.5–5.0)
Anion gap: 11 (ref 5–15)
BUN: 68 mg/dL — ABNORMAL HIGH (ref 8–23)
CO2: 28 mmol/L (ref 22–32)
Calcium: 8 mg/dL — ABNORMAL LOW (ref 8.9–10.3)
Chloride: 99 mmol/L (ref 98–111)
Creatinine, Ser: 6.26 mg/dL — ABNORMAL HIGH (ref 0.44–1.00)
GFR calc Af Amer: 7 mL/min — ABNORMAL LOW (ref >60)
GFR calc non Af Amer: 6 mL/min — ABNORMAL LOW (ref >60)
Glucose, Bld: 110 mg/dL — ABNORMAL HIGH (ref 70–99)
Phosphorus: 6.8 mg/dL — ABNORMAL HIGH (ref 2.5–4.6)
Potassium: 3.5 mmol/L (ref 3.5–5.1)
Sodium: 138 mmol/L (ref 135–145)

## 2019-09-08 LAB — URINALYSIS, ROUTINE W REFLEX MICROSCOPIC
Bilirubin Urine: NEGATIVE
Glucose, UA: 150 mg/dL — AB
Hgb urine dipstick: NEGATIVE
Ketones, ur: NEGATIVE mg/dL
Nitrite: NEGATIVE
Protein, ur: >=300 mg/dL — AB
Specific Gravity, Urine: 1.01 (ref 1.005–1.030)
pH: 6 (ref 5.0–8.0)

## 2019-09-08 LAB — BLOOD GAS, ARTERIAL
Acid-Base Excess: 1.1 mmol/L (ref 0.0–2.0)
Acid-base deficit: 0.2 mmol/L (ref 0.0–2.0)
Bicarbonate: 23.2 mmol/L (ref 20.0–28.0)
Bicarbonate: 25.2 mmol/L (ref 20.0–28.0)
FIO2: 44
FIO2: 50
O2 Saturation: 83.2 %
O2 Saturation: 96 %
Patient temperature: 36.7
Patient temperature: 36.7
pCO2 arterial: 70 mmHg (ref 32.0–48.0)
pCO2 arterial: 46.7 mmHg (ref 32.0–48.0)
pH, Arterial: 7.208 — ABNORMAL LOW (ref 7.350–7.450)
pH, Arterial: 7.363 (ref 7.350–7.450)
pO2, Arterial: 55.3 mmHg — ABNORMAL LOW (ref 83.0–108.0)
pO2, Arterial: 77.8 mmHg — ABNORMAL LOW (ref 83.0–108.0)

## 2019-09-08 LAB — BASIC METABOLIC PANEL
Anion gap: 12 (ref 5–15)
BUN: 70 mg/dL — ABNORMAL HIGH (ref 8–23)
CO2: 27 mmol/L (ref 22–32)
Calcium: 8.2 mg/dL — ABNORMAL LOW (ref 8.9–10.3)
Chloride: 99 mmol/L (ref 98–111)
Creatinine, Ser: 6.75 mg/dL — ABNORMAL HIGH (ref 0.44–1.00)
GFR calc Af Amer: 7 mL/min — ABNORMAL LOW (ref >60)
GFR calc non Af Amer: 6 mL/min — ABNORMAL LOW (ref >60)
Glucose, Bld: 223 mg/dL — ABNORMAL HIGH (ref 70–99)
Potassium: 4.3 mmol/L (ref 3.5–5.1)
Sodium: 138 mmol/L (ref 135–145)

## 2019-09-08 LAB — CBC
HCT: 26.3 % — ABNORMAL LOW (ref 36.0–46.0)
Hemoglobin: 8.7 g/dL — ABNORMAL LOW (ref 12.0–15.0)
MCH: 33.2 pg (ref 26.0–34.0)
MCHC: 33.1 g/dL (ref 30.0–36.0)
MCV: 100.4 fL — ABNORMAL HIGH (ref 80.0–100.0)
Platelets: 171 10*3/uL (ref 150–400)
RBC: 2.62 MIL/uL — ABNORMAL LOW (ref 3.87–5.11)
RDW: 13.6 % (ref 11.5–15.5)
WBC: 6.7 10*3/uL (ref 4.0–10.5)
nRBC: 0 % (ref 0.0–0.2)

## 2019-09-08 LAB — MRSA PCR SCREENING: MRSA by PCR: NEGATIVE

## 2019-09-08 LAB — GLUCOSE, CAPILLARY
Glucose-Capillary: 98 mg/dL (ref 70–99)
Glucose-Capillary: 227 mg/dL — ABNORMAL HIGH (ref 70–99)
Glucose-Capillary: 231 mg/dL — ABNORMAL HIGH (ref 70–99)
Glucose-Capillary: 165 mg/dL — ABNORMAL HIGH (ref 70–99)
Glucose-Capillary: 183 mg/dL — ABNORMAL HIGH (ref 70–99)

## 2019-09-08 LAB — PARATHYROID HORMONE, INTACT (NO CA): PTH: 263 pg/mL — ABNORMAL HIGH (ref 15–65)

## 2019-09-08 LAB — TROPONIN I (HIGH SENSITIVITY): Troponin I (High Sensitivity): 132 ng/L (ref <18)

## 2019-09-08 MED ORDER — MIDAZOLAM HCL 2 MG/2ML IJ SOLN
1.0000 mg | INTRAMUSCULAR | Status: AC | PRN
  Administered 2019-09-08 (×3): 1 mg via INTRAVENOUS
  Filled 2019-09-08 (×2): qty 2

## 2019-09-08 MED ORDER — TRAZODONE HCL 50 MG PO TABS
50.0000 mg | ORAL_TABLET | Freq: Every day | ORAL | Status: DC
  Administered 2019-09-09 – 2019-09-19 (×10): 50 mg via NASOGASTRIC
  Filled 2019-09-08 (×11): qty 1

## 2019-09-08 MED ORDER — INSULIN ASPART 100 UNIT/ML ~~LOC~~ SOLN
0.0000 [IU] | SUBCUTANEOUS | Status: DC
  Administered 2019-09-08 – 2019-09-16 (×16): 1 [IU] via SUBCUTANEOUS
  Administered 2019-09-16: 2 [IU] via SUBCUTANEOUS
  Administered 2019-09-16 (×2): 1 [IU] via SUBCUTANEOUS
  Administered 2019-09-16: 2 [IU] via SUBCUTANEOUS
  Administered 2019-09-17 (×2): 1 [IU] via SUBCUTANEOUS
  Administered 2019-09-17 (×2): 3 [IU] via SUBCUTANEOUS
  Administered 2019-09-17 (×2): 1 [IU] via SUBCUTANEOUS
  Administered 2019-09-17: 2 [IU] via SUBCUTANEOUS
  Administered 2019-09-18: 3 [IU] via SUBCUTANEOUS

## 2019-09-08 MED ORDER — CALCITRIOL 0.25 MCG PO CAPS: 0.2500 ug | ORAL_CAPSULE | ORAL | Status: DC

## 2019-09-08 MED ORDER — PANTOPRAZOLE SODIUM 40 MG IV SOLR
40.0000 mg | Freq: Every day | INTRAVENOUS | Status: DC
  Administered 2019-09-08 – 2019-09-15 (×8): 40 mg via INTRAVENOUS
  Filled 2019-09-08 (×8): qty 40

## 2019-09-08 MED ORDER — FENTANYL CITRATE (PF) 100 MCG/2ML IJ SOLN: 25.0000 ug | INTRAMUSCULAR | Status: DC | PRN

## 2019-09-08 MED ORDER — LEVOTHYROXINE SODIUM 25 MCG PO TABS
25.0000 ug | ORAL_TABLET | Freq: Every day | ORAL | Status: DC
  Administered 2019-09-09 – 2019-09-20 (×10): 25 ug via NASOGASTRIC
  Filled 2019-09-08 (×11): qty 1

## 2019-09-08 MED ORDER — ADULT MULTIVITAMIN W/MINERALS CH: 1.0000 | ORAL_TABLET | Freq: Every morning | ORAL | Status: DC

## 2019-09-08 MED ORDER — ATORVASTATIN CALCIUM 40 MG PO TABS
20.0000 mg | ORAL_TABLET | Freq: Every day | ORAL | Status: DC
  Administered 2019-09-09 – 2019-09-20 (×11): 20 mg via NASOGASTRIC
  Filled 2019-09-08 (×11): qty 1

## 2019-09-08 MED ORDER — CALCITRIOL 0.25 MCG PO CAPS
0.2500 ug | ORAL_CAPSULE | ORAL | Status: DC
  Administered 2019-09-10 – 2019-09-12 (×2): 0.25 ug via NASOGASTRIC
  Filled 2019-09-08 (×4): qty 1

## 2019-09-08 MED ORDER — ADULT MULTIVITAMIN LIQUID CH
15.0000 mL | Freq: Every day | ORAL | Status: DC
  Administered 2019-09-09 – 2019-09-21 (×11): 15 mL
  Filled 2019-09-08 (×12): qty 15

## 2019-09-08 MED ORDER — ACETAMINOPHEN 325 MG PO TABS
325.0000 mg | ORAL_TABLET | Freq: Two times a day (BID) | ORAL | Status: DC
  Administered 2019-09-09 – 2019-09-20 (×18): 325 mg via NASOGASTRIC
  Filled 2019-09-08 (×18): qty 1

## 2019-09-08 MED ORDER — ACETAMINOPHEN 650 MG RE SUPP
650.0000 mg | Freq: Four times a day (QID) | RECTAL | Status: DC | PRN
  Administered 2019-09-11: 650 mg via RECTAL
  Filled 2019-09-08: qty 1

## 2019-09-08 MED ORDER — GABAPENTIN 250 MG/5ML PO SOLN
100.0000 mg | Freq: Two times a day (BID) | ORAL | Status: DC
  Administered 2019-09-09 (×2): 100 mg via ORAL
  Filled 2019-09-08 (×4): qty 2

## 2019-09-08 MED ORDER — FUROSEMIDE 10 MG/ML IJ SOLN: 80.0000 mg | Freq: Two times a day (BID) | INTRAMUSCULAR | Status: DC

## 2019-09-08 MED ORDER — ACETAMINOPHEN 325 MG PO TABS
650.0000 mg | ORAL_TABLET | Freq: Four times a day (QID) | ORAL | Status: DC | PRN
  Administered 2019-09-10 (×3): 650 mg via NASOGASTRIC
  Filled 2019-09-08 (×3): qty 2

## 2019-09-08 MED ORDER — PROPOFOL 10 MG/ML IV BOLUS
INTRAVENOUS | Status: DC | PRN
  Administered 2019-09-08: 30 mg via INTRAVENOUS

## 2019-09-08 MED ORDER — MIDAZOLAM HCL 2 MG/2ML IJ SOLN
1.0000 mg | INTRAMUSCULAR | Status: DC | PRN
  Administered 2019-09-08 – 2019-09-20 (×11): 1 mg via INTRAVENOUS
  Filled 2019-09-08 (×13): qty 2

## 2019-09-08 MED ORDER — CHLORHEXIDINE GLUCONATE CLOTH 2 % EX PADS
6.0000 | MEDICATED_PAD | Freq: Every day | CUTANEOUS | Status: DC
  Administered 2019-09-08 – 2019-09-17 (×8): 6 via TOPICAL

## 2019-09-08 MED ORDER — MONTELUKAST SODIUM 10 MG PO TABS
10.0000 mg | ORAL_TABLET | Freq: Every day | ORAL | Status: DC
  Administered 2019-09-09 – 2019-09-19 (×10): 10 mg
  Filled 2019-09-08 (×13): qty 1

## 2019-09-08 MED ORDER — CHLORHEXIDINE GLUCONATE 0.12% ORAL RINSE (MEDLINE KIT)
15.0000 mL | Freq: Two times a day (BID) | OROMUCOSAL | Status: DC
  Administered 2019-09-08 – 2019-09-20 (×22): 15 mL via OROMUCOSAL

## 2019-09-08 MED ORDER — IOHEXOL 300 MG/ML  SOLN
100.0000 mL | Freq: Once | INTRAMUSCULAR | Status: AC | PRN
  Administered 2019-09-08: 100 mL via INTRAVENOUS

## 2019-09-08 MED ORDER — VITAMIN B-6 100 MG PO TABS
200.0000 mg | ORAL_TABLET | Freq: Two times a day (BID) | ORAL | Status: DC
  Administered 2019-09-09 – 2019-09-20 (×20): 200 mg via NASOGASTRIC
  Filled 2019-09-08: qty 4
  Filled 2019-09-08: qty 2
  Filled 2019-09-08: qty 4
  Filled 2019-09-08: qty 2
  Filled 2019-09-08: qty 4
  Filled 2019-09-08 (×4): qty 2
  Filled 2019-09-08: qty 4
  Filled 2019-09-08: qty 2
  Filled 2019-09-08 (×2): qty 4
  Filled 2019-09-08 (×2): qty 2
  Filled 2019-09-08 (×2): qty 4
  Filled 2019-09-08 (×4): qty 2
  Filled 2019-09-08: qty 4

## 2019-09-08 MED ORDER — PROPOFOL 1000 MG/100ML IV EMUL
INTRAVENOUS | Status: AC
  Filled 2019-09-08: qty 100

## 2019-09-08 MED ORDER — ASPIRIN 81 MG PO CHEW
81.0000 mg | CHEWABLE_TABLET | Freq: Every morning | ORAL | Status: DC
  Administered 2019-09-09 – 2019-09-20 (×10): 81 mg via NASOGASTRIC
  Filled 2019-09-08 (×10): qty 1

## 2019-09-08 MED ORDER — HYDROXYZINE HCL 10 MG/5ML PO SYRP
25.0000 mg | ORAL_SOLUTION | Freq: Three times a day (TID) | ORAL | Status: DC | PRN
  Filled 2019-09-08: qty 12.5

## 2019-09-08 MED ORDER — CARVEDILOL 3.125 MG PO TABS
6.2500 mg | ORAL_TABLET | Freq: Two times a day (BID) | ORAL | Status: DC
  Administered 2019-09-09 (×2): 6.25 mg via NASOGASTRIC
  Filled 2019-09-08 (×2): qty 2

## 2019-09-08 MED ORDER — ORAL CARE MOUTH RINSE
15.0000 mL | OROMUCOSAL | Status: DC
  Administered 2019-09-08 – 2019-09-20 (×114): 15 mL via OROMUCOSAL

## 2019-09-08 MED ORDER — ETOMIDATE 2 MG/ML IV SOLN
INTRAVENOUS | Status: DC | PRN
  Administered 2019-09-08: 10 mg via INTRAVENOUS

## 2019-09-08 MED ORDER — RISPERIDONE 0.5 MG PO TABS
0.5000 mg | ORAL_TABLET | Freq: Two times a day (BID) | ORAL | Status: DC
  Administered 2019-09-09 – 2019-09-20 (×20): 0.5 mg via NASOGASTRIC
  Filled 2019-09-08 (×23): qty 1

## 2019-09-08 MED ORDER — SUCCINYLCHOLINE CHLORIDE 20 MG/ML IJ SOLN
INTRAMUSCULAR | Status: DC | PRN
  Administered 2019-09-08: 100 mg via INTRAVENOUS

## 2019-09-08 MED ORDER — FENTANYL CITRATE (PF) 100 MCG/2ML IJ SOLN
25.0000 ug | INTRAMUSCULAR | Status: DC | PRN
  Administered 2019-09-08 (×2): 100 ug via INTRAVENOUS
  Administered 2019-09-09: 50 ug via INTRAVENOUS
  Filled 2019-09-08 (×3): qty 2

## 2019-09-08 MED ORDER — LORAZEPAM 1 MG PO TABS
1.0000 mg | ORAL_TABLET | Freq: Every day | ORAL | Status: DC | PRN
  Administered 2019-09-10: 1 mg
  Filled 2019-09-08: qty 1

## 2019-09-08 MED ORDER — PRIMIDONE 50 MG PO TABS
50.0000 mg | ORAL_TABLET | Freq: Every morning | ORAL | Status: DC
  Administered 2019-09-09 – 2019-09-20 (×10): 50 mg via NASOGASTRIC
  Filled 2019-09-08 (×14): qty 1

## 2019-09-08 MED ORDER — FUROSEMIDE 10 MG/ML IJ SOLN
80.0000 mg | Freq: Two times a day (BID) | INTRAMUSCULAR | Status: DC
  Administered 2019-09-08 – 2019-09-21 (×26): 80 mg via INTRAVENOUS
  Filled 2019-09-08 (×26): qty 8

## 2019-09-08 NOTE — Anesthesia Procedure Notes (Signed)
Procedure Name: Intubation Date/Time: 09/08/2019 4:05 PM Performed by: Denese Killings, MD Pre-anesthesia Checklist: Patient identified, Emergency Drugs available, Suction available and Patient being monitored Patient Re-evaluated:Patient Re-evaluated prior to induction Oxygen Delivery Method: Ambu bag Preoxygenation: Pre-oxygenation with 100% oxygen Induction Type: IV induction and Rapid sequence Laryngoscope Size: Glidescope and 3 Grade View: Grade I Tube type: Oral Number of attempts: 1 Airway Equipment and Method: Stylet Placement Confirmation: ETT inserted through vocal cords under direct vision,  positive ETCO2 and breath sounds checked- equal and bilateral Secured at: 21 cm Tube secured with: Tape Dental Injury: Teeth and Oropharynx as per pre-operative assessment

## 2019-09-08 NOTE — Progress Notes (Signed)
Anesthesia was called to intubate the patient. Patient had sudden altered level consciousness, hypercapnia and renal failure. Intubated with glidescope, atraumatic, equal bilateral breath sounds, ETCO2 positive, vital signs stable. Endorsed to ICU team.

## 2019-09-08 NOTE — Progress Notes (Signed)
CRITICAL VALUE ALERT  Critical Value:  Troponin 132  Date & Time Notied:  1/23 @ 1630.  Provider Notified: Roderic Palau, MD.  Orders Received/Actions taken: Awaiting further orders. Pt having workup.

## 2019-09-08 NOTE — Progress Notes (Signed)
Transported patient to CT and back, no problems encountered back about 2200

## 2019-09-08 NOTE — Progress Notes (Addendum)
[facility] ROUNDING NOTE   Subjective:   Patient with 1.6 liters UOP over 1/22 with 2 additional unmeasured voids.  She states lives at a SNF.  Doesn't feel good. She confirms again that she does want dialysis and we discussed this would be for the remainder of her life and she states she understands she would need it to live.  She has been at her SNF for a year and last walked a week ago but for a short distance.  Review of systems:  Reports shortness of breath Denies difficulty urinating  Some nausea  Not really hungry for her salad   -------------------  Background on consult:  This is a 69 year old lady with a history of hypertension diabetes hyperlipidemia diastolic heart failure and schizoaffective disorder baseline creatinine in early 2020 of mid 2 milligrams per deciliter.  She is a resident of [facility] and was brought to the emergency room with increasing shortness of breath and swelling.  There is also an episode of presyncope which is being evaluated by her primary physicians.  She was noted to have mild interstitial edema and a creatinine of 6.  She is being diuresed with Lasix 80 mg twice daily IV.  Renal ultrasound showed limited visualization of right kidney due to body habitus bilateral renal cortical atrophy significant for renal medical renal disease no gross evidence of mass or hydronephrosis with limitations of examination.     Objective:  Vital signs in last 24 hours:  Temp:  [97.7 F (36.5 C)-98 F (36.7 C)] 97.7 F (36.5 C) (01/23 0551) Pulse Rate:  [76-78] 76 (01/23 0551) Resp:  [20-22] 22 (01/23 0551) BP: (156-185)/(77-78) 156/77 (01/23 0551) SpO2:  [92 %-95 %] 95 % (01/23 0754) Weight:  [104.7 kg] 104.7 kg (01/23 0600)  Weight change: 0.4 kg Filed Weights   09/06/19 1627 09/07/19 0507 09/08/19 0600  Weight: 104.3 kg 104.8 kg 104.7 kg    Intake/Output: I/O last 3 completed shifts: In: 77 [P.O.:970; I.V.:6] Out: 1800  [Urine:1800]   Physical exam:  General adult female in bed some shortness of breath with exertion HEENT normocephalic atraumatic extraocular movements intact sclera anicteric Neck supple trachea midline Lungs clear but reduced to auscultation bilaterally normal work of breathing at rest and increased with exertion Heart S1S2 no rub Abdomen soft nontender obese habitus Extremities 1-2+ edema lower extremity edema Neuro alert and oriented x 3; follows commands  gu - purewick with yellow urine - half full    Basic Metabolic Panel: Recent Labs  Lab 09/06/19 0343 09/07/19 0532 09/08/19 0525  NA 142 140 138  K 4.0 3.6 3.5  CL 104 101 99  CO2 26 27 28   GLUCOSE 148* 104* 110*  BUN 62* 64* 68*  CREATININE 6.34* 6.47* 6.26*  CALCIUM 8.2* 7.9* 8.0*  PHOS  --  6.9* 6.8*    Liver Function Tests: Recent Labs  Lab 09/06/19 0343 09/07/19 0532 09/08/19 0525  AST 19  --   --   ALT 20  --   --   ALKPHOS 73  --   --   BILITOT 0.5  --   --   PROT 5.8*  --   --   ALBUMIN 2.6* 2.5* 2.5*   CBC: Recent Labs  Lab 09/06/19 0343 09/07/19 0532  WBC 5.9 6.7  NEUTROABS 5.1  --   HGB 7.5* 7.2*  HCT 22.9* 22.1*  MCV 100.0 100.5*  PLT 155 159    Microbiology: Results for orders placed or performed during  the hospital encounter of 09/06/19  SARS CORONAVIRUS 2 (TAT 6-24 HRS) Nasopharyngeal Nasopharyngeal Swab     Status: None   Collection Time: 09/06/19  3:43 AM   Specimen: Nasopharyngeal Swab  Result Value Ref Range Status   SARS Coronavirus 2 NEGATIVE NEGATIVE Final    Comment: (NOTE) SARS-CoV-2 target nucleic acids are NOT DETECTED. The SARS-CoV-2 RNA is generally detectable in upper and lower respiratory specimens during the acute phase of infection. Negative results do not preclude SARS-CoV-2 infection, do not rule out co-infections with other pathogens, and should not be used as the sole basis for treatment or other patient management decisions. Negative results must be  combined with clinical observations, patient history, and epidemiological information. The expected result is Negative. Fact Sheet for Patients: SugarRoll.be Fact Sheet for Healthcare Providers: https://www.woods-mathews.com/ This test is not yet approved or cleared by the [place] FDA and  has been authorized for detection and/or diagnosis of SARS-CoV-2 by FDA under an Emergency Use Authorization (EUA). This EUA will remain  in effect (meaning this test can be used) for the duration of the COVID-19 declaration under Section 56 4(b)(1) of the Act, 21 U.S.C. section 360bbb-3(b)(1), unless the authorization is terminated or revoked sooner. Performed at [facility], [place] [address] [place], [place] [address]     Urinalysis: Recent Labs    09/06/19 1500  COLORURINE YELLOW  LABSPEC 1.015  PHURINE 6.0  GLUCOSEU >=500*  HGBUR NEGATIVE  BILIRUBINUR NEGATIVE  KETONESUR NEGATIVE  PROTEINUR >=300*  NITRITE NEGATIVE  LEUKOCYTESUR NEGATIVE      Imaging: No results found.   Medications:   . sodium chloride     . acetaminophen  325 mg Oral BID  . aspirin  81 mg Oral q morning - 10a  . atorvastatin  20 mg Oral Daily  . carvedilol  6.25 mg Oral BID WC  . cholecalciferol  4,000 Units Oral q morning - 10a  . darbepoetin (ARANESP) injection - NON-DIALYSIS  150 mcg Subcutaneous Q Thu-1800  . DULoxetine  60 mg Oral Daily  . furosemide  80 mg Intravenous Q8H  . gabapentin  100 mg Oral BID  . heparin  5,000 Units Subcutaneous Q8H  . influenza vaccine adjuvanted  0.5 mL Intramuscular Tomorrow-1000  . insulin aspart  0-15 Units Subcutaneous TID WC  . insulin aspart  0-5 Units Subcutaneous QHS  . insulin glargine  16 Units Subcutaneous QHS  . levothyroxine  25 mcg Oral Q0600  . linagliptin  5 mg Oral q morning - 10a  . montelukast  10 mg Oral QHS  . multivitamin with minerals  1 tablet Oral q morning - 10a  .  neomycin-bacitracin-polymyxin  1 application Topical Daily  . pantoprazole  40 mg Oral Daily  . primidone  50 mg Oral q morning - 10a  . pyridOXINE  200 mg Oral BID  . risperiDONE  0.5 mg Oral BID  . sodium chloride flush  3 mL Intravenous Q12H  . traZODone  50 mg Oral Daily   sodium chloride, acetaminophen **OR** acetaminophen, albuterol, albuterol, hydrOXYzine, labetalol, LORazepam, ondansetron **OR** ondansetron (ZOFRAN) IV, polyethylene glycol, sodium chloride flush  Assessment/ Plan:  # Chronic kidney disease probably stage V with progression secondary to diabetic nephropathy.  Note Cr 3.12 in 05/2019.  Renal ultrasound was suggestive of chronic disease.  There is no evidence of acute glomerulonephritis based on urinalysis.   - No acute indication for dialysis today - Overloaded with advanced CKD and feels poorly.  She  does have poor current functional status.  Does want to pursue dialysis.  Will plan for initiation of HD on 1/25 Monday after access placement.  NPO after midnight Sunday evening for access on 1/25  - It may be worthwhile sending serological evaluation for SPEP, UPEP, and free light chains if she hasn't had these  - She previously saw Dr. Hinda Lenis who has since transitioned his practice to another provider.  Will need CLIP  - Discontinue cymbalta given renal failure - check post void residual and if over 250 mL place foley  # ANEMIA- iron studies good. S/p aranesp 150 mcg last dosed 1/21.  # Secondary hyperparathyroidism with mineral bone disease - start calcitriol 0.25 mcg MWF for now. Renal diet.   # HTN-appears to be markedly volume overloaded it is unclear if there has been accurate collection   # Hypothyroidism on replacement therapy per primary   #  Schizophrenia appears to be stable on current doses of medication  # Diastolic heart failure with diastolic dysfunction continue diuresis ; on coreg  Claudia Desanctis, MD  09/08/2019 12:57 PM

## 2019-09-08 NOTE — Progress Notes (Signed)
Completed Imaging as ordered by Dr Roderic Palau after phone call to complete this order.

## 2019-09-08 NOTE — Progress Notes (Addendum)
PROGRESS NOTE    [name]  QBH:[id number] DOB: September 29, 1950 DOA: 09/06/2019 PCP: Glenda Chroman, MD    Brief Narrative:  69 year old female with history of hypertension, diabetes, diastolic heart failure, schizoaffective disorder, chronic kidney disease stage IV, who presented to the emergency room with acute kidney injury on chronic kidney disease and creatinine of 6.3.  She was noted to be volume overload.  She is admitted to the hospital, started on intravenous Lasix and is being followed by nephrology.  She has not had any significant improvement in renal function and is felt that she will likely need hemodialysis.  Plans were to start hemodialysis on 1/25.  On 1/23, patient had a sudden change in mental status.  She had increased respiratory effort and blood gas showed respiratory acidosis.  Patient was transferred to the ICU and was intubated.  Further work-up for mental status change including CT head has been ordered.   Assessment & Plan:   Principal Problem:   Acute kidney injury superimposed on chronic kidney disease ([place]) Active Problems:   Type 2 diabetes mellitus with diabetic polyneuropathy, without long-term current use of insulin (HCC)   Normocytic anemia   Schizophrenia (HCC)   Depression   Essential hypertension   Chronic diastolic heart failure (HCC)   CKD (chronic kidney disease), stage IV (HCC)   Obesity, Class III, BMI 40-49.9 (morbid obesity) (HCC)   Type 2 diabetes mellitus (HCC)   Hypothyroidism   Volume overload   1. Acute respiratory failure with hypoxia and hypercapnia.  Patient noted to be significantly hypoxic with increased work of breathing and change in mental status.  ABG performed showed respiratory acidosis with a PCO2 in the 70s and a pH of 7.2.  Due to her mental status, she does not appear to be appropriate for BiPAP.  Will need to transfer the patient to the ICU and intubate.  Check chest x-ray.  Check EKG/troponin. 2. Acute encephalopathy.   Likely related to hypercapnia.  She was reportedly had a normal mental status (she had no dysarthria and was awake, coherent) at 12:30 PM today.  With her sudden change in mental status and neuro deficits, she will need further evaluation with CT head. 3. Acute kidney injury on chronic kidney disease stage IV.  Nephrology following.  She is currently on intravenous Lasix and is making urine.  It is felt that she will likely need to start dialysis.  Plans were to start hemodialysis on Monday after vascular access has been obtained. 4. Chronic diastolic heart failure.  Appears to be volume overloaded.  On intravenous Lasix. 5. Diabetes.  She is on sliding scale insulin and linagliptin.  Blood sugar was elevated today.  Continue to monitor on sliding scale insulin. 6. Normocytic anemia.  Felt to be related to chronic kidney disease.  She does not have any evidence of bleeding.  She received Aranesp.  Iron studies have been performed.  Repeat CBC pending. 7. Hypothyroidism.  She is on Synthroid 8. Morbid obesity/obstructive sleep apnea.  She uses CPAP nightly. 9. Neuropsychiatric disorders.  Underlying schizoaffective disorder.  She is chronically on Risperdal and Lamictal.   DVT prophylaxis: SCDs Code Status: Full code Family Communication: Patient is a resident of a group home.  She does not have any family.  Contacted her emergency contact, [name] who is the administrator at the facility.  She said that patient does not have any designated power of attorney. Disposition Plan: Transfer to ICU for further care   Consultants:  Nephrology  Procedures:     Antimicrobials:       Subjective: Patient is lying in bed.  Speech is dysarthric.  She has increased work of breathing.  She is slow to respond.  She says she is in the hospital.  Still feels short of breath.  Vitals check shows that she is hypoxic on room air with an oxygen saturation in the 70s.  Heart rate and blood pressure are  stable.  She is afebrile.  Objective: Vitals:   09/08/19 0551 09/08/19 0600 09/08/19 0754 09/08/19 1514  BP: (!) 156/77   (!) 119/56  Pulse: 76   80  Resp: (!) 22   16  Temp: 97.7 F (36.5 C)   98.1 F (36.7 C)  TempSrc: Oral   Axillary  SpO2: 95%  95% 100%  Weight:  104.7 kg    Height:  5' (1.524 m)      Intake/Output Summary (Last 24 hours) at 09/08/2019 1557 Last data filed at 09/08/2019 0500 Gross per 24 hour  Intake 240 ml  Output 1600 ml  Net -1360 ml   Filed Weights   09/06/19 1627 09/07/19 0507 09/08/19 0600  Weight: 104.3 kg 104.8 kg 104.7 kg    Examination:  General exam: Patient is lying in bed, is slow to respond, increased work of breathing Respiratory system: Diminished breath sounds at bases, using accessory muscles. Cardiovascular system: S1 & S2 heard, RRR. No JVD, murmurs, rubs, gallops or clicks.  Gastrointestinal system: Abdomen is nondistended, soft and nontender. No organomegaly or masses felt. Normal bowel sounds heard. Central nervous system: Appears to have right-sided facial droop.  She is still responding.  He is able to lift both her arms in the air.  Does not move her feet on command.  Speech is dysarthric. Extremities: 1-2+ pedal edema bilaterally Skin: No rashes, lesions or ulcers Psychiatry: Unable to assess due to neuro status    Data Reviewed: I have personally reviewed following labs and imaging studies  CBC: Recent Labs  Lab 09/06/19 0343 09/07/19 0532  WBC 5.9 6.7  NEUTROABS 5.1  --   HGB 7.5* 7.2*  HCT 22.9* 22.1*  MCV 100.0 100.5*  PLT 155 073   Basic Metabolic Panel: Recent Labs  Lab 09/06/19 0343 09/07/19 0532 09/08/19 0525  NA 142 140 138  K 4.0 3.6 3.5  CL 104 101 99  CO2 _0 GLUCOSE 148* 104* 110*  BUN 62* 64* 68*  CREATININE 6.34* 6.47* 6.26*  CALCIUM 8.2* 7.9* 8.0*  PHOS  --  6.9* 6.8*   GFR: Estimated Creatinine Clearance: 9.5 mL/min (A) (by C-G formula based on SCr of 6.26 mg/dL (H)). Liver  Function Tests: Recent Labs  Lab 09/06/19 0343 09/07/19 0532 09/08/19 0525  AST 19  --   --   ALT 20  --   --   ALKPHOS 73  --   --   BILITOT 0.5  --   --   PROT 5.8*  --   --   ALBUMIN 2.6* 2.5* 2.5*   No results for input(s): LIPASE, AMYLASE in the last 168 hours. No results for input(s): AMMONIA in the last 168 hours. Coagulation Profile: No results for input(s): INR, PROTIME in the last 168 hours. Cardiac Enzymes: No results for input(s): CKTOTAL, CKMB, CKMBINDEX, TROPONINI in the last 168 hours. BNP (last 3 results) No results for input(s): PROBNP in the last 8760 hours. HbA1C: Recent Labs    09/06/19 0343  HGBA1C 5.2  CBG: Recent Labs  Lab 09/07/19 1713 09/07/19 2049 09/08/19 0734 09/08/19 1216 09/08/19 1457  GLUCAP 112* 182* 98 227* 231*   Lipid Profile: No results for input(s): CHOL, HDL, LDLCALC, TRIG, CHOLHDL, LDLDIRECT in the last 72 hours. Thyroid Function Tests: No results for input(s): TSH, T4TOTAL, FREET4, T3FREE, THYROIDAB in the last 72 hours. Anemia Panel: Recent Labs    09/07/19 0532  FERRITIN 19  TIBC 241*  IRON 62   Sepsis Labs: No results for input(s): PROCALCITON, LATICACIDVEN in the last 168 hours.  Recent Results (from the past 240 hour(s))  SARS CORONAVIRUS 2 (TAT 6-24 HRS) Nasopharyngeal Nasopharyngeal Swab     Status: None   Collection Time: 09/06/19  3:43 AM   Specimen: Nasopharyngeal Swab  Result Value Ref Range Status   SARS Coronavirus 2 NEGATIVE NEGATIVE Final    Comment: (NOTE) SARS-CoV-2 target nucleic acids are NOT DETECTED. The SARS-CoV-2 RNA is generally detectable in upper and lower respiratory specimens during the acute phase of infection. Negative results do not preclude SARS-CoV-2 infection, do not rule out co-infections with other pathogens, and should not be used as the sole basis for treatment or other patient management decisions. Negative results must be combined with clinical observations, patient  history, and epidemiological information. The expected result is Negative. Fact Sheet for Patients: SugarRoll.be Fact Sheet for Healthcare Providers: https://www.woods-mathews.com/ This test is not yet approved or cleared by the [place] FDA and  has been authorized for detection and/or diagnosis of SARS-CoV-2 by FDA under an Emergency Use Authorization (EUA). This EUA will remain  in effect (meaning this test can be used) for the duration of the COVID-19 declaration under Section 56 4(b)(1) of the Act, 21 U.S.C. section 360bbb-3(b)(1), unless the authorization is terminated or revoked sooner. Performed at [facility], [place] [address], [place], [place] [address]          Radiology Studies: No results found.      Scheduled Meds: . acetaminophen  325 mg Oral BID  . aspirin  81 mg Oral q morning - 10a  . atorvastatin  20 mg Oral Daily  . [START ON 09/10/2019] calcitRIOL  0.25 mcg Oral Q M,W,F  . carvedilol  6.25 mg Oral BID WC  . chlorhexidine gluconate (MEDLINE KIT)  15 mL Mouth Rinse BID  . Chlorhexidine Gluconate Cloth  6 each Topical Daily  . cholecalciferol  4,000 Units Oral q morning - 10a  . darbepoetin (ARANESP) injection - NON-DIALYSIS  150 mcg Subcutaneous Q Thu-1800  . furosemide  80 mg Intravenous Q12H  . gabapentin  100 mg Oral BID  . heparin  5,000 Units Subcutaneous Q8H  . influenza vaccine adjuvanted  0.5 mL Intramuscular Tomorrow-1000  . insulin aspart  0-15 Units Subcutaneous TID WC  . insulin aspart  0-5 Units Subcutaneous QHS  . insulin glargine  16 Units Subcutaneous QHS  . levothyroxine  25 mcg Oral Q0600  . linagliptin  5 mg Oral q morning - 10a  . mouth rinse  15 mL Mouth Rinse 10 times per day  . montelukast  10 mg Oral QHS  . multivitamin with minerals  1 tablet Oral q morning - 10a  . neomycin-bacitracin-polymyxin  1 application Topical Daily  . pantoprazole  40 mg Oral Daily  . pantoprazole  (PROTONIX) IV  40 mg Intravenous Daily  . primidone  50 mg Oral q morning - 10a  . pyridOXINE  200 mg Oral BID  . risperiDONE  0.5 mg Oral BID  .  sodium chloride flush  3 mL Intravenous Q12H  . traZODone  50 mg Oral Daily   Continuous Infusions: . sodium chloride    . propofol       LOS: 2 days    Critical care procedure note Authorized and performed by: Kathie Dike Total critical care time: Approximately 40 minutes Due to high probability of clinically significant, life-threatening deterioration, the patient required my highest level of preparedness to intervene emergently and I personally spent this critical care time directly and personally managing the patient.  The critical care time included obtaining a history, examining the patient, pulse oximetry, ordering and review of studies, arranging urgent treatment with development of a management plan, evaluation of patient's response to treatment, frequent reassessment, discussions with other providers.  Critical care time was performed to assess and manage the high probability of imminent, life-threatening deterioration that could result in multiorgan failure.  It was exclusive of separate billable procedures and treating other patients and teaching time.  Please see MDM section and the rest of the of note for further information on patient assessment and treatment     Kathie Dike, MD [facility]   If 7PM-7AM, please contact night-coverage [email or web address]  09/08/2019, 3:57 PM    Addendum: 21:00:  Considering that patient had sudden onset of severe hypoxia and respiratory failure without any obvious cause, there is concern for pulmonary embolus. Discussed with nephrology, Dr. Joelyn Oms and since patient will be considered ESRD and is to be started on HD in the next 1-2 days, administering IV contrast at this time to rule out PE in a critically ill patient seemed reasonable.  [employer]

## 2019-09-08 NOTE — Consult Note (Signed)
[facility] TeleNeurology Consult Services  Stat Consult  Date of Service:   09/08/2019 17:16:14  Impression:     .  G93.41 - Encephalopathy Metabolic  Comments/Sign-Out: 69yo woman w/PMH of HTN, DM, CHF, CKD4, COPD, RA, schizoaffective disorder admitted for acute of chronic renal failure on 09/06/19. She was scheduled to start hemodialysis on 1/25 due to lack of improvement in renal function. On 1/23, she had change in mental status with respiratory acidosis, was transferred to ICU and intubated. During her mental status change and prior to intubation, she was noted to have dysarthria and possible right facial droop so neurology was consulted. As per attending, her head was leaning to the right when right facial was noted so it is unclear if this was a true deficit, no other focality noted on exam, just very lethargic in the setting of her documented metabolic changes. She was last seen well at 12:30. She has received midazolam and fentanyl 10mg  just prior to neuro evaluation due to agitation, no other sedation at this time. NIHSS 34 but not an accurate score due to very recent sedation given to patient. Unable to perform accurate neuro assessment at this time. CT Head has no acute findings. Pt is not a candidate for alteplase due to being out of the 4.5 hour window. Presentation is most consistent with a metabolic encephalopathy due to respiratory acidosis and AKI, although cannot rule out the possibility of small vessel infarct if facial droop and dysarthria were true neuro deficits, stroke workup advised when possible.  CT HEAD: Showed No Acute Hemorrhage or Acute Core Infarct  Metrics: [facility] Notification Time: 09/08/2019 17:14:07 Stamp Time: 09/08/2019 17:16:14 Callback Response Time: 09/08/2019 17:22:25  Our recommendations are outlined below.  Recommendations:     .  Continue aspirin 81mg  daily     .  Telemetry     .  Normal Saline     .  SCDs for DVT  prophylaxis     .  Optimize blood pressure, temp, glucose  Imaging Studies:     .  MRI Head Without Contrast     .  MRA Head and Neck Without Contrast When Available - Stroke Protocol     .  Carotid Dopplers     .  Echocardiogram - Transthoracic Echocardiogram  Therapies:     .  Physical Therapy, Occupational Therapy, Speech Therapy Assessment When Applicable  Other WorkUp:     .  Infectious/metabolic workup and treatment per primary team  Disposition: Neurology Follow Up Recommended  Sign Out:     .  Discussed with Primary Attending     .  Discussed with Rapid Response Team  ----------------------------------------------------------------------------------------------------  Chief Complaint: AMS, dysarthria, right facial droop  History of Present Illness: Patient is a 69 year old Female.  69yo woman w/PMH of HTN, DM, CHF, CKD4, COPD, RA, schizoaffective disorder admitted for acute of chronic renal failure on 09/06/19. She was scheduled to start hemodialysis on 1/25 due to lack of improvement in renal function. On 1/23, she had change in mental status with respiratory acidosis, was transferred to ICU and intubated. During her mental status change and prior to intubation, she was noted to have dysarthria and possible right facial droop so neurology was consulted. As per attending, her head was leaning to the right when right facial was noted so it is unclear if this was a true deficit, no other focality noted on exam, just very lethargic in the setting of her documented metabolic changes. She was  last seen well at 12:30. She has received midazolam and fentanyl 10mg  just prior to neuro evaluation due to agitation, no other sedation at this time.   Past Medical History:     . Hypertension     . Diabetes Mellitus     . There is NO history of Hyperlipidemia     . There is NO history of Atrial Fibrillation     . There is NO history of Coronary Artery Disease     . There is NO history  of Stroke  Anticoagulant use:  No  Antiplatelet use: aspirin  NIHSS may not be reliable due to: Patient was intubated and paralyzed  Examination: BP(127/80), Pulse(73), Blood Glucose(165) 1A: Level of Consciousness - Postures or Unresponsive + 3 1B: Ask Month and Age - Could Not Answer Either Question Correctly + 2 1C: Blink Eyes & Squeeze Hands - Performs 0 Tasks + 2 2: Test Horizontal Extraocular Movements - Normal + 0 3: Test Visual Fields - Bilateral Hemianopia + 3 4: Test Facial Palsy (Use Grimace if Obtunded) - Bilateral Complete paralysis (upper/lower face) + 3 5A: Test Left Arm Motor Drift - No Movement + 4 5B: Test Right Arm Motor Drift - No Movement + 4 6A: Test Left Leg Motor Drift - No Movement + 4 6B: Test Right Leg Motor Drift - No Movement + 4 7: Test Limb Ataxia (FNF/Heel-Shin) - Paralyzed + 0 8: Test Sensation - Coma/Unresponsive + 2 9: Test Language/Aphasia - Coma/Unresponsive + 3 10: Test Dysarthria - Intubated/Unable to Test + 0 11: Test Extinction/Inattention - No abnormality + 0  NIHSS Score: 34 (unreliable due to sedation)  Patient/Family was informed the Neurology Consult would happen via TeleHealth consult by way of interactive audio and video telecommunications and consented to receiving care in this manner.  Due to the immediate potential for life-threatening deterioration due to underlying acute neurologic illness, I spent 35 minutes providing critical care. This time includes time for face to face visit via telemedicine, review of medical records, imaging studies and discussion of findings with providers, the patient and/or family.   Dr Lenard Galloway Shatha Hooser   [facility] [phone]   Case RX:[id number]

## 2019-09-08 NOTE — Progress Notes (Addendum)
Called to check patient and get an ABG. Pt O2 sats on 6L was 85%. Pt placed on NRB after ABG draw. O2 sats 93%

## 2019-09-09 ENCOUNTER — Inpatient Hospital Stay (HOSPITAL_COMMUNITY): Payer: Medicare Other

## 2019-09-09 DIAGNOSIS — R0602 Shortness of breath: Secondary | ICD-10-CM | POA: Diagnosis not present

## 2019-09-09 DIAGNOSIS — I5032 Chronic diastolic (congestive) heart failure: Secondary | ICD-10-CM | POA: Diagnosis not present

## 2019-09-09 DIAGNOSIS — E8779 Other fluid overload: Secondary | ICD-10-CM | POA: Diagnosis not present

## 2019-09-09 DIAGNOSIS — N179 Acute kidney failure, unspecified: Secondary | ICD-10-CM | POA: Diagnosis not present

## 2019-09-09 DIAGNOSIS — N184 Chronic kidney disease, stage 4 (severe): Secondary | ICD-10-CM | POA: Diagnosis not present

## 2019-09-09 DIAGNOSIS — G9341 Metabolic encephalopathy: Secondary | ICD-10-CM | POA: Diagnosis not present

## 2019-09-09 DIAGNOSIS — Z978 Presence of other specified devices: Secondary | ICD-10-CM

## 2019-09-09 LAB — CBC
HCT: 20.9 % — ABNORMAL LOW (ref 36.0–46.0)
Hemoglobin: 7 g/dL — ABNORMAL LOW (ref 12.0–15.0)
MCH: 32.7 pg (ref 26.0–34.0)
MCHC: 33.5 g/dL (ref 30.0–36.0)
MCV: 97.7 fL (ref 80.0–100.0)
Platelets: 155 10*3/uL (ref 150–400)
RBC: 2.14 MIL/uL — ABNORMAL LOW (ref 3.87–5.11)
RDW: 13.5 % (ref 11.5–15.5)
WBC: 9 10*3/uL (ref 4.0–10.5)
nRBC: 0 % (ref 0.0–0.2)

## 2019-09-09 LAB — RENAL FUNCTION PANEL
Albumin: 2.3 g/dL — ABNORMAL LOW (ref 3.5–5.0)
Anion gap: 12 (ref 5–15)
BUN: 70 mg/dL — ABNORMAL HIGH (ref 8–23)
CO2: 25 mmol/L (ref 22–32)
Calcium: 8.1 mg/dL — ABNORMAL LOW (ref 8.9–10.3)
Chloride: 101 mmol/L (ref 98–111)
Creatinine, Ser: 6.42 mg/dL — ABNORMAL HIGH (ref 0.44–1.00)
GFR calc Af Amer: 7 mL/min — ABNORMAL LOW (ref >60)
GFR calc non Af Amer: 6 mL/min — ABNORMAL LOW (ref >60)
Glucose, Bld: 100 mg/dL — ABNORMAL HIGH (ref 70–99)
Phosphorus: 6.4 mg/dL — ABNORMAL HIGH (ref 2.5–4.6)
Potassium: 3.2 mmol/L — ABNORMAL LOW (ref 3.5–5.1)
Sodium: 138 mmol/L (ref 135–145)

## 2019-09-09 LAB — BLOOD GAS, ARTERIAL
Acid-Base Excess: 1.9 mmol/L (ref 0.0–2.0)
Bicarbonate: 26 mmol/L (ref 20.0–28.0)
FIO2: 40
O2 Saturation: 93 %
Patient temperature: 37
pCO2 arterial: 40.8 mmHg (ref 32.0–48.0)
pH, Arterial: 7.42 (ref 7.350–7.450)
pO2, Arterial: 65.6 mmHg — ABNORMAL LOW (ref 83.0–108.0)

## 2019-09-09 LAB — PROTIME-INR
INR: 1.1 (ref 0.8–1.2)
Prothrombin Time: 14.5 seconds (ref 11.4–15.2)

## 2019-09-09 LAB — GLUCOSE, CAPILLARY
Glucose-Capillary: 125 mg/dL — ABNORMAL HIGH (ref 70–99)
Glucose-Capillary: 97 mg/dL (ref 70–99)
Glucose-Capillary: 110 mg/dL — ABNORMAL HIGH (ref 70–99)
Glucose-Capillary: 103 mg/dL — ABNORMAL HIGH (ref 70–99)
Glucose-Capillary: 85 mg/dL (ref 70–99)

## 2019-09-09 LAB — PREPARE RBC (CROSSMATCH)

## 2019-09-09 LAB — ABO/RH: ABO/RH(D): A POS

## 2019-09-09 MED ORDER — CHLORHEXIDINE GLUCONATE CLOTH 2 % EX PADS
6.0000 | MEDICATED_PAD | Freq: Every day | CUTANEOUS | Status: DC
  Administered 2019-09-10 – 2019-09-15 (×5): 6 via TOPICAL

## 2019-09-09 MED ORDER — FENTANYL 2500MCG IN NS 250ML (10MCG/ML) PREMIX INFUSION: 25.0000 ug/h | INTRAVENOUS | Status: DC

## 2019-09-09 MED ORDER — HYDROXYZINE HCL 25 MG PO TABS
25.0000 mg | ORAL_TABLET | Freq: Three times a day (TID) | ORAL | Status: DC | PRN
  Administered 2019-09-19 – 2019-09-20 (×2): 25 mg
  Filled 2019-09-09 (×2): qty 1

## 2019-09-09 MED ORDER — LEVOFLOXACIN IN D5W 500 MG/100ML IV SOLN
500.0000 mg | INTRAVENOUS | Status: DC
  Administered 2019-09-11 – 2019-09-13 (×2): 500 mg via INTRAVENOUS
  Filled 2019-09-09 (×2): qty 100

## 2019-09-09 MED ORDER — SODIUM CHLORIDE 0.9% IV SOLUTION: Freq: Once | INTRAVENOUS | Status: AC

## 2019-09-09 MED ORDER — FENTANYL BOLUS VIA INFUSION
25.0000 ug | INTRAVENOUS | Status: DC | PRN
  Administered 2019-09-09 – 2019-09-10 (×2): 25 ug via INTRAVENOUS
  Filled 2019-09-09: qty 25

## 2019-09-09 MED ORDER — LEVOFLOXACIN IN D5W 750 MG/150ML IV SOLN
750.0000 mg | Freq: Once | INTRAVENOUS | Status: AC
  Administered 2019-09-09: 750 mg via INTRAVENOUS
  Filled 2019-09-09: qty 150

## 2019-09-09 MED ORDER — POTASSIUM CHLORIDE 20 MEQ/15ML (10%) PO SOLN
40.0000 meq | Freq: Once | ORAL | Status: AC
  Administered 2019-09-09: 40 meq
  Filled 2019-09-09: qty 30

## 2019-09-09 MED ORDER — FENTANYL CITRATE (PF) 100 MCG/2ML IJ SOLN
25.0000 ug | Freq: Once | INTRAMUSCULAR | Status: AC
  Administered 2019-09-09: 25 ug via INTRAVENOUS
  Filled 2019-09-09: qty 2

## 2019-09-09 MED ORDER — FENTANYL 2500MCG IN NS 250ML (10MCG/ML) PREMIX INFUSION
25.0000 ug/h | INTRAVENOUS | Status: DC
  Administered 2019-09-09: 25 ug/h via INTRAVENOUS
  Filled 2019-09-09 (×2): qty 250

## 2019-09-09 NOTE — Progress Notes (Signed)
[facility]  Patient will need dialysis. Notes seem to indicate no decision maker. Will plan for catheter tomorrow. May need to do temporary if unable to get decision maker for tunneled. Will discuss with Dr. Roderic Palau, already discussed with Dr. Royce Macadamia.  Curlene Labrum, MD

## 2019-09-09 NOTE — Progress Notes (Addendum)
[facility] ROUNDING NOTE   Subjective:   She was moved to the ICU in interim since last exam.  She was on room air yesterday AM and then she was then intubated yesterday afternoon.  She had 850 mL uop over 1/23.  Had a CTA given sudden onset and was negative for PE; noted to have moderate severity left upper lobe and right lower lobe atelectasis and/on infiltrate with marked severity consolidation throughout entire right lower lobe and moderate bilateral pleural effusions.  She had confirmed with me on 1/23 that she did want HD when needed.   Received K this AM.   Review of systems:  Denies air hunger; limited by vent   -------------------  Background on consult:  This is a 69 year old lady with a history of hypertension diabetes hyperlipidemia diastolic heart failure and schizoaffective disorder baseline creatinine in early 2020 of mid 2 milligrams per deciliter.  She is a resident of [facility] and was brought to the emergency room with increasing shortness of breath and swelling.  There is also an episode of presyncope which is being evaluated by her primary physicians.  She was noted to have mild interstitial edema and a creatinine of 6.  She is being diuresed with Lasix 80 mg twice daily IV.  Renal ultrasound showed limited visualization of right kidney due to body habitus bilateral renal cortical atrophy significant for renal medical renal disease no gross evidence of mass or hydronephrosis with limitations of examination.     Objective:  Vital signs in last 24 hours:  Temp:  [97.8 F (36.6 C)-98.2 F (36.8 C)] 98.2 F (36.8 C) (01/24 0749) Pulse Rate:  [67-85] 78 (01/24 0600) Resp:  [9-20] 9 (01/24 0600) BP: (100-179)/(55-88) 164/69 (01/24 0600) SpO2:  [97 %-100 %] 100 % (01/24 0926) FiO2 (%):  [40 %-70 %] 40 % (01/24 0926) Weight:  [99.9 kg] 99.9 kg (01/24 0500)  Weight change: -4.8 kg Filed Weights   09/07/19 0507 09/08/19 0600 09/09/19 0500  Weight: 104.8  kg 104.7 kg 99.9 kg    Intake/Output: I/O last 3 completed shifts: In: 10 [Other:10] Out: 1450 [Urine:1450]   Physical exam:  General adult female in bed intubated critically ill  HEENT normocephalic atraumatic extraocular movements intact sclera anicteric Neck supple trachea midline Lungs coarse mechanical breath sounds; FI02 40% and PEEP 5 Heart S1S2 no rub Abdomen soft nontender obese habitus Extremities 1-2+ edema lower extremity edema Neuro awakens with exam and follows commands RUE    Basic Metabolic Panel: Recent Labs  Lab 09/06/19 0343 09/06/19 0343 09/07/19 0532 09/07/19 0532 09/08/19 0525 09/08/19 1544 09/09/19 0424  NA 142  --  140  --  138 138 138  K 4.0  --  3.6  --  3.5 4.3 3.2*  CL 104  --  101  --  99 99 101  CO2 26  --  27  --  '28 27 25  '$ GLUCOSE 148*  --  104*  --  110* 223* 100*  BUN 62*  --  64*  --  68* 70* 70*  CREATININE 6.34*  --  6.47*  --  6.26* 6.75* 6.42*  CALCIUM 8.2*   < > 7.9*   < > 8.0* 8.2* 8.1*  PHOS  --   --  6.9*  --  6.8*  --  6.4*   < > = values in this interval not displayed.    Liver Function Tests: Recent Labs  Lab 09/06/19 0343 09/07/19 0532 09/08/19 0525 09/09/19  0424  AST 19  --   --   --   ALT 20  --   --   --   ALKPHOS 73  --   --   --   BILITOT 0.5  --   --   --   PROT 5.8*  --   --   --   ALBUMIN 2.6* 2.5* 2.5* 2.3*   CBC: Recent Labs  Lab 09/06/19 0343 09/07/19 0532 09/08/19 1544 09/09/19 0424  WBC 5.9 6.7 6.7 9.0  NEUTROABS 5.1  --   --   --   HGB 7.5* 7.2* 8.7* 7.0*  HCT 22.9* 22.1* 26.3* 20.9*  MCV 100.0 100.5* 100.4* 97.7  PLT 155 159 171 155    Microbiology: Results for orders placed or performed during the hospital encounter of 09/06/19  SARS CORONAVIRUS 2 (TAT 6-24 HRS) Nasopharyngeal Nasopharyngeal Swab     Status: None   Collection Time: 09/06/19  3:43 AM   Specimen: Nasopharyngeal Swab  Result Value Ref Range Status   SARS Coronavirus 2 NEGATIVE NEGATIVE Final    Comment:  (NOTE) SARS-CoV-2 target nucleic acids are NOT DETECTED. The SARS-CoV-2 RNA is generally detectable in upper and lower respiratory specimens during the acute phase of infection. Negative results do not preclude SARS-CoV-2 infection, do not rule out co-infections with other pathogens, and should not be used as the sole basis for treatment or other patient management decisions. Negative results must be combined with clinical observations, patient history, and epidemiological information. The expected result is Negative. Fact Sheet for Patients: SugarRoll.be Fact Sheet for Healthcare Providers: https://www.woods-mathews.com/ This test is not yet approved or cleared by the [place] FDA and  has been authorized for detection and/or diagnosis of SARS-CoV-2 by FDA under an Emergency Use Authorization (EUA). This EUA will remain  in effect (meaning this test can be used) for the duration of the COVID-19 declaration under Section 56 4(b)(1) of the Act, 21 U.S.C. section 360bbb-3(b)(1), unless the authorization is terminated or revoked sooner. Performed at [facility], [place] [address], [place], [place] [address]   MRSA PCR Screening     Status: None   Collection Time: 09/08/19 12:30 PM   Specimen: Nasal Mucosa; Nasopharyngeal  Result Value Ref Range Status   MRSA by PCR NEGATIVE NEGATIVE Final    Comment:        The GeneXpert MRSA Assay (FDA approved for NASAL specimens only), is one component of a comprehensive MRSA colonization surveillance program. It is not intended to diagnose MRSA infection nor to guide or monitor treatment for MRSA infections. Performed at [facility], [address] [place], [place] [address]     Urinalysis: Recent Labs    09/06/19 1500 09/08/19 1231  COLORURINE YELLOW YELLOW  LABSPEC 1.015 1.010  PHURINE 6.0 6.0  GLUCOSEU >=500* 150*  HGBUR NEGATIVE NEGATIVE  BILIRUBINUR NEGATIVE NEGATIVE   KETONESUR NEGATIVE NEGATIVE  PROTEINUR >=300* >=300*  NITRITE NEGATIVE NEGATIVE  LEUKOCYTESUR NEGATIVE SMALL*      Imaging: CT HEAD WO CONTRAST  Result Date: 09/08/2019 CLINICAL DATA:  Altered mental status. EXAM: CT HEAD WITHOUT CONTRAST TECHNIQUE: Contiguous axial images were obtained from the base of the skull through the vertex without intravenous contrast. COMPARISON:  September 15, 2018 FINDINGS: Brain: There is mild cerebral atrophy with widening of the extra-axial spaces and ventricular dilatation. There are areas of decreased attenuation within the white matter tracts of the supratentorial brain, consistent with microvascular disease changes. A chronic left basal ganglia lacunar  infarct is noted. Vascular: No hyperdense vessel or unexpected calcification. Skull: Normal. Negative for fracture or focal lesion. Sinuses/Orbits: A stable 2.0 cm x 1.4 cm polyp versus mucous retention cyst is seen within the anterior aspect of the left maxillary sinus. Other: None. IMPRESSION: 1. Generalized cerebral atrophy. 2. No acute intracranial abnormality. 3. Chronic left basal ganglia lacunar infarct. 4. Stable left maxillary sinus polyp versus mucous retention cyst. Electronically Signed   By: Virgina Norfolk M.D.   On: 09/08/2019 16:48   CT ANGIO CHEST PE W OR WO CONTRAST  Result Date: 09/08/2019 CLINICAL DATA:  Severe hypoxia. EXAM: CT ANGIOGRAPHY CHEST WITH CONTRAST TECHNIQUE: Multidetector CT imaging of the chest was performed using the standard protocol during bolus administration of intravenous contrast. Multiplanar CT image reconstructions and MIPs were obtained to evaluate the vascular anatomy. CONTRAST:  198m OMNIPAQUE IOHEXOL 300 MG/ML  SOLN COMPARISON:  Jan 12, 2018 FINDINGS: Cardiovascular: There is mild calcification of the aortic arch. Satisfactory opacification of the pulmonary arteries to the segmental level. No evidence of pulmonary embolism. There is mild, stable cardiomegaly. No  pericardial effusion. Marked severity coronary artery calcification is seen. Mediastinum/Nodes: No enlarged mediastinal, hilar, or axillary lymph nodes. Thyroid gland, trachea, and esophagus demonstrate no significant findings. Lungs/Pleura: Endotracheal and nasogastric tubes are present. Moderate severity atelectasis and/or infiltrate is seen within the posterior aspect of the left upper lobe and left lower lobe with marked severity consolidation seen throughout the entire right lower lobe. Moderate size bilateral pleural effusions are seen, right greater than left. No pneumothorax is seen. Upper Abdomen: No acute abnormality. Musculoskeletal: Multilevel degenerative changes seen throughout the thoracic spine Review of the MIP images confirms the above findings. IMPRESSION: 1. No CT evidence of acute pulmonary embolism. 2. Moderate severity left upper lobe and right lower lobe atelectasis and/or infiltrate with marked severity consolidation seen throughout the entire right lower lobe. 3. Moderate size bilateral pleural effusions, right greater than left. Electronically Signed   By: TVirgina NorfolkM.D.   On: 09/08/2019 22:13   Portable Chest xray  Result Date: 09/09/2019 CLINICAL DATA:  Respiratory failure EXAM: PORTABLE CHEST 1 VIEW COMPARISON:  CTA chest dated 09/08/2019 FINDINGS: Endotracheal tube terminates 2.5 cm above the carina. Enteric tube courses into the stomach. Moderate layering right pleural effusion. Associated right lower lobe opacity, likely atelectasis. Small left pleural effusion. Pulmonary vascular congestion without frank interstitial edema. Cardiomegaly. IMPRESSION: Endotracheal tube terminates 2.5 cm above the carina. Cardiomegaly with pulmonary vascular congestion. No frank interstitial edema. Moderate right and small left pleural effusions. Associated right lower lobe opacity, likely atelectasis. Electronically Signed   By: SJulian HyM.D.   On: 09/09/2019 07:10   DG CHEST  PORT 1 VIEW  Result Date: 09/08/2019 CLINICAL DATA:  Endotracheal tube placement EXAM: PORTABLE CHEST 1 VIEW COMPARISON:  09/18/2018.  09/08/2019 FINDINGS: The heart size remains enlarged. Endotracheal tube terminates approximately 3.7 cm above the carina. The enteric tube extends below the left hemidiaphragm. Moderate to large bilateral pleural effusions are noted. Bibasilar airspace disease is noted which is favored to represent atelectasis. There is no pneumothorax. Vascular congestion is noted. IMPRESSION: 1. Lines and tubes as above. 2. Persistent findings of congestive heart failure with moderate to large bilateral pleural effusions. Electronically Signed   By: CConstance HolsterM.D.   On: 09/08/2019 17:23   DG CHEST PORT 1 VIEW  Result Date: 09/08/2019 CLINICAL DATA:  Status post intubation. EXAM: PORTABLE CHEST 1 VIEW COMPARISON:  September 06, 2019. FINDINGS: An  endotracheal tube is seen with its distal tip approximately 1.7 cm from the carina. This represents a new finding when compared to the prior study. A nasogastric tube is seen with its distal end extending below the level of the diaphragm. This also represents a new finding. There is no evidence of focal consolidation, pleural effusion or pneumothorax. The cardiac silhouette is moderately enlarged. Multilevel degenerative changes seen throughout the thoracic spine. IMPRESSION: 1. Interval endotracheal tube and nasogastric tube placement and positioning, as described above, when compared to the prior study dated September 06, 2019. Electronically Signed   By: Virgina Norfolk M.D.   On: 09/08/2019 16:24     Medications:   . sodium chloride    . fentaNYL infusion INTRAVENOUS    . [START ON 09/11/2019] levofloxacin (LEVAQUIN) IV    . levofloxacin (LEVAQUIN) IV     . acetaminophen  325 mg Per NG tube BID  . aspirin  81 mg Per NG tube q morning - 10a  . atorvastatin  20 mg Per NG tube q1800  . [START ON 09/10/2019] calcitRIOL  0.25 mcg Per  NG tube Q M,W,F  . carvedilol  6.25 mg Per NG tube BID WC  . chlorhexidine gluconate (MEDLINE KIT)  15 mL Mouth Rinse BID  . Chlorhexidine Gluconate Cloth  6 each Topical Daily  . cholecalciferol  4,000 Units Oral q morning - 10a  . darbepoetin (ARANESP) injection - NON-DIALYSIS  150 mcg Subcutaneous Q Thu-1800  . furosemide  80 mg Intravenous Q12H  . gabapentin  100 mg Oral Q12H  . heparin  5,000 Units Subcutaneous Q8H  . influenza vaccine adjuvanted  0.5 mL Intramuscular Tomorrow-1000  . insulin aspart  0-6 Units Subcutaneous Q4H  . insulin glargine  16 Units Subcutaneous QHS  . levothyroxine  25 mcg Per NG tube Q0600  . mouth rinse  15 mL Mouth Rinse 10 times per day  . montelukast  10 mg Per Tube QHS  . multivitamin  15 mL Per Tube Daily  . neomycin-bacitracin-polymyxin  1 application Topical Daily  . pantoprazole (PROTONIX) IV  40 mg Intravenous Daily  . primidone  50 mg Per NG tube q morning - 10a  . pyridOXINE  200 mg Per NG tube BID  . risperiDONE  0.5 mg Per NG tube BID  . sodium chloride flush  3 mL Intravenous Q12H  . traZODone  50 mg Per NG tube Daily   sodium chloride, acetaminophen **OR** acetaminophen, albuterol, albuterol, fentaNYL, hydrOXYzine, labetalol, LORazepam, midazolam, ondansetron **OR** ondansetron (ZOFRAN) IV, polyethylene glycol, sodium chloride flush  Assessment/ Plan:  # Chronic kidney disease probably stage V with progression secondary to diabetic nephropathy.  Note Cr 3.12 in 05/2019.  Renal ultrasound was suggestive of chronic disease.  There is no evidence of acute glomerulonephritis based on urinalysis.   - No acute indication for dialysis today - Overloaded with advanced CKD.  She does have poor current functional status but does want to pursue dialysis.   - Will initiate HD after access placement.  Paging general surgery to coordinate timing  - She previously saw Dr. Hinda Lenis who has since transitioned his practice to another provider.  Will need  CLIP  - Discontinued cymbalta given renal failure.  Discontinue neurontin given hx AMS  # Acute hypoxic resp failure - on mechanical ventilation  - optimize volume with diuretics and RRT  - on levaquin per primary  # ANEMIA- iron is replete. S/p aranesp 150 mcg last dosed 1/21.  Would transfuse  today - Hb is 7 and patient nods that she consents  # Secondary hyperparathyroidism with mineral bone disease - start calcitriol 0.25 mcg MWF for now. Renal diet.   # HTN- secondary in part due to overload   # Hypothyroidism on replacement therapy per primary   #  Schizophrenia appears to be stable on current doses of medication  # Diastolic heart failure with diastolic dysfunction continue diuresis and transition to iHD; on coreg  Claudia Desanctis, MD  09/09/2019 11:38 AM

## 2019-09-09 NOTE — Progress Notes (Addendum)
PROGRESS NOTE    [name]  HCW:[id number] DOB: Apr 07, 1951 DOA: 09/06/2019 PCP: Glenda Chroman, MD    Brief Narrative:  69 year old female with history of hypertension, diabetes, diastolic heart failure, schizoaffective disorder, chronic kidney disease stage IV, who presented to the emergency room with acute kidney injury on chronic kidney disease and creatinine of 6.3.  She was noted to be volume overload.  She is admitted to the hospital, started on intravenous Lasix and is being followed by nephrology.  She has not had any significant improvement in renal function and is felt that she will likely need hemodialysis.  Plans were to start hemodialysis on 1/25.  On 1/23, patient had a sudden change in mental status.  She had increased respiratory effort and blood gas showed respiratory acidosis.  Patient was transferred to the ICU and was intubated.  Further work-up for mental status change including CT head has been ordered.   Assessment & Plan:   Principal Problem:   Acute kidney injury superimposed on chronic kidney disease ([place]) Active Problems:   Type 2 diabetes mellitus with diabetic polyneuropathy, without long-term current use of insulin (HCC)   Normocytic anemia   Schizophrenia (HCC)   Depression   Essential hypertension   Chronic diastolic heart failure (HCC)   CKD (chronic kidney disease), stage IV (HCC)   Obesity, Class III, BMI 40-49.9 (morbid obesity) (HCC)   Type 2 diabetes mellitus (HCC)   Hypothyroidism   Volume overload   On mechanically assisted ventilation (HCC)   Acute metabolic encephalopathy   1. Acute respiratory failure with hypoxia and hypercapnia.  Patient noted to be significantly hypoxic with increased work of breathing and change in mental status.  ABG performed showed respiratory acidosis with a PCO2 in the 70s and a pH of 7.2.  Due to her lethargy and increased work of breathing, she was not felt to be appropriate for BiPAP.  She was transferred to  ICU and intubated for airway management. Follow up ABG shows improvement of pH and pCO2. Since she appeared to have a sudden decline in her respiratory status, CTA chest was performed that was negative for pulmonary embolus. It did show dense consolidation on right side.  We will start her on antibiotics.  Since she has a penicillin allergy, will start on Levaquin. Will hold off on vancomycin since MRSA PCR is negative. She appears to be more awake today. Can consider starting weaning trials today. Consult pulmonology in AM. 2. Acute encephalopathy.  Likely related to hypercapnia.  Overall change in mental status appeared to have occurred within a few hours. CT head was unrevealing. Patient seen by tele neurology and who felt that encephalopathy was likely metabolic in nature. With improvement of ABG today, overall mental status appears to be doing better today. 3. Acute kidney injury on chronic kidney disease stage IV.  Nephrology following.  She is currently on intravenous Lasix and is making urine.  It is felt that she will likely need to start dialysis.  Plans were to start hemodialysis on Monday after vascular access has been obtained. 4. Acute on Chronic diastolic heart failure.  Appears to be volume overloaded.  On intravenous Lasix. 5. Pleural effusions, bilateral. Likely related to CHF. Contributing for respiratory failure. Continue diuresis. May need to consider thoracentesis. 6. Diabetes.  She is on sliding scale insulin and linagliptin.  Blood sugar was elevated today.  Continue to monitor on sliding scale insulin. 7. Normocytic anemia.  Felt to be related to chronic kidney disease.  She does not have any evidence of bleeding.  She received Aranesp.  Iron studies have been performed.  Transfuse for hemoglobin <7. 8. Hypothyroidism.  She is on Synthroid 9. Morbid obesity/obstructive sleep apnea.  She uses CPAP nightly. 10. Neuropsychiatric disorders.  Underlying schizoaffective disorder.  She is  chronically on Risperdal and Lamictal.   DVT prophylaxis: SCDs Code Status: Full code Family Communication: Patient is a resident of a group home.  She does not have any family.  Contacted her emergency contact, [name] who is the administrator at the facility.  She said that patient does not have any designated power of attorney. Disposition Plan: discharge home once renal and pulmonary status has stabilized   Consultants:   Nephrology  Procedures:   ETT 1/23>  Antimicrobials:   Levaquin 1/24>   Subjective: Patient is intubated on vent. Hemodynamically stable. Vent settings improved overnight. On 40% FiO2. She is awake, does not appear to be uncomfortable.  Objective: Vitals:   09/09/19 0500 09/09/19 0600 09/09/19 0749 09/09/19 0926  BP: (!) 122/55 (!) 164/69    Pulse: 73 78    Resp: 20 (!) 9    Temp:   98.2 F (36.8 C)   TempSrc:   Oral   SpO2: 99% 99%  100%  Weight: 99.9 kg     Height: _0  (1.575 m)       Intake/Output Summary (Last 24 hours) at 09/09/2019 1003 Last data filed at 09/09/2019 0500 Gross per 24 hour  Intake 10 ml  Output 850 ml  Net -840 ml   Filed Weights   09/07/19 0507 09/08/19 0600 09/09/19 0500  Weight: 104.8 kg 104.7 kg 99.9 kg    Examination:  General exam: intubated on ventilator Respiratory system: diminished breath sounds on right, crackles at bases. Respiratory effort normal. Cardiovascular system:RRR. No murmurs, rubs, gallops. Gastrointestinal system: Abdomen is nondistended, soft and nontender. No organomegaly or masses felt. Normal bowel sounds heard. Central nervous system: . Moving extremities spontaneously. Extremities: 1-2+ edema bilaterally Skin: No rashes, lesions or ulcers Psychiatry: unable to assess since she is on a vent     Data Reviewed: I have personally reviewed following labs and imaging studies  CBC: Recent Labs  Lab 09/06/19 0343 09/07/19 0532 09/08/19 1544 09/09/19 0424  WBC 5.9 6.7 6.7  9.0  NEUTROABS 5.1  --   --   --   HGB 7.5* 7.2* 8.7* 7.0*  HCT 22.9* 22.1* 26.3* 20.9*  MCV 100.0 100.5* 100.4* 97.7  PLT 155 159 171 542   Basic Metabolic Panel: Recent Labs  Lab 09/06/19 0343 09/07/19 0532 09/08/19 0525 09/08/19 1544 09/09/19 0424  NA 142 140 138 138 138  K 4.0 3.6 3.5 4.3 3.2*  CL 104 101 99 99 101  CO2 _1 GLUCOSE 148* 104* 110* 223* 100*  BUN 62* 64* 68* 70* 70*  CREATININE 6.34* 6.47* 6.26* 6.75* 6.42*  CALCIUM 8.2* 7.9* 8.0* 8.2* 8.1*  PHOS  --  6.9* 6.8*  --  6.4*   GFR: Estimated Creatinine Clearance: 9.4 mL/min (A) (by C-G formula based on SCr of 6.42 mg/dL (H)). Liver Function Tests: Recent Labs  Lab 09/06/19 0343 09/07/19 0532 09/08/19 0525 09/09/19 0424  AST 19  --   --   --   ALT 20  --   --   --   ALKPHOS 73  --   --   --   BILITOT 0.5  --   --   --  PROT 5.8*  --   --   --   ALBUMIN 2.6* 2.5* 2.5* 2.3*   No results for input(s): LIPASE, AMYLASE in the last 168 hours. No results for input(s): AMMONIA in the last 168 hours. Coagulation Profile: No results for input(s): INR, PROTIME in the last 168 hours. Cardiac Enzymes: No results for input(s): CKTOTAL, CKMB, CKMBINDEX, TROPONINI in the last 168 hours. BNP (last 3 results) No results for input(s): PROBNP in the last 8760 hours. HbA1C: No results for input(s): HGBA1C in the last 72 hours. CBG: Recent Labs  Lab 09/08/19 1457 09/08/19 1742 09/08/19 2001 09/09/19 0308 09/09/19 0742  GLUCAP 231* 165* 183* 125* 97   Lipid Profile: No results for input(s): CHOL, HDL, LDLCALC, TRIG, CHOLHDL, LDLDIRECT in the last 72 hours. Thyroid Function Tests: No results for input(s): TSH, T4TOTAL, FREET4, T3FREE, THYROIDAB in the last 72 hours. Anemia Panel: Recent Labs    09/07/19 0532  FERRITIN 19  TIBC 241*  IRON 62   Sepsis Labs: No results for input(s): PROCALCITON, LATICACIDVEN in the last 168 hours.  Recent Results (from the past 240 hour(s))  SARS  CORONAVIRUS 2 (TAT 6-24 HRS) Nasopharyngeal Nasopharyngeal Swab     Status: None   Collection Time: 09/06/19  3:43 AM   Specimen: Nasopharyngeal Swab  Result Value Ref Range Status   SARS Coronavirus 2 NEGATIVE NEGATIVE Final    Comment: (NOTE) SARS-CoV-2 target nucleic acids are NOT DETECTED. The SARS-CoV-2 RNA is generally detectable in upper and lower respiratory specimens during the acute phase of infection. Negative results do not preclude SARS-CoV-2 infection, do not rule out co-infections with other pathogens, and should not be used as the sole basis for treatment or other patient management decisions. Negative results must be combined with clinical observations, patient history, and epidemiological information. The expected result is Negative. Fact Sheet for Patients: SugarRoll.be Fact Sheet for Healthcare Providers: https://www.woods-mathews.com/ This test is not yet approved or cleared by the [place] FDA and  has been authorized for detection and/or diagnosis of SARS-CoV-2 by FDA under an Emergency Use Authorization (EUA). This EUA will remain  in effect (meaning this test can be used) for the duration of the COVID-19 declaration under Section 56 4(b)(1) of the Act, 21 U.S.C. section 360bbb-3(b)(1), unless the authorization is terminated or revoked sooner. Performed at [facility], [place] [address] [place], [place] [address]   MRSA PCR Screening     Status: None   Collection Time: 09/08/19 12:30 PM   Specimen: Nasal Mucosa; Nasopharyngeal  Result Value Ref Range Status   MRSA by PCR NEGATIVE NEGATIVE Final    Comment:        The GeneXpert MRSA Assay (FDA approved for NASAL specimens only), is one component of a comprehensive MRSA colonization surveillance program. It is not intended to diagnose MRSA infection nor to guide or monitor treatment for MRSA infections. Performed at [facility], [address]  [place], [place] [address]          Radiology Studies: CT HEAD WO CONTRAST  Result Date: 09/08/2019 CLINICAL DATA:  Altered mental status. EXAM: CT HEAD WITHOUT CONTRAST TECHNIQUE: Contiguous axial images were obtained from the base of the skull through the vertex without intravenous contrast. COMPARISON:  September 15, 2018 FINDINGS: Brain: There is mild cerebral atrophy with widening of the extra-axial spaces and ventricular dilatation. There are areas of decreased attenuation within the white matter tracts of the supratentorial brain, consistent with microvascular disease changes. A chronic left  basal ganglia lacunar infarct is noted. Vascular: No hyperdense vessel or unexpected calcification. Skull: Normal. Negative for fracture or focal lesion. Sinuses/Orbits: A stable 2.0 cm x 1.4 cm polyp versus mucous retention cyst is seen within the anterior aspect of the left maxillary sinus. Other: None. IMPRESSION: 1. Generalized cerebral atrophy. 2. No acute intracranial abnormality. 3. Chronic left basal ganglia lacunar infarct. 4. Stable left maxillary sinus polyp versus mucous retention cyst. Electronically Signed   By: Virgina Norfolk M.D.   On: 09/08/2019 16:48   CT ANGIO CHEST PE W OR WO CONTRAST  Result Date: 09/08/2019 CLINICAL DATA:  Severe hypoxia. EXAM: CT ANGIOGRAPHY CHEST WITH CONTRAST TECHNIQUE: Multidetector CT imaging of the chest was performed using the standard protocol during bolus administration of intravenous contrast. Multiplanar CT image reconstructions and MIPs were obtained to evaluate the vascular anatomy. CONTRAST:  154m OMNIPAQUE IOHEXOL 300 MG/ML  SOLN COMPARISON:  Jan 12, 2018 FINDINGS: Cardiovascular: There is mild calcification of the aortic arch. Satisfactory opacification of the pulmonary arteries to the segmental level. No evidence of pulmonary embolism. There is mild, stable cardiomegaly. No pericardial effusion. Marked severity coronary artery calcification is seen.  Mediastinum/Nodes: No enlarged mediastinal, hilar, or axillary lymph nodes. Thyroid gland, trachea, and esophagus demonstrate no significant findings. Lungs/Pleura: Endotracheal and nasogastric tubes are present. Moderate severity atelectasis and/or infiltrate is seen within the posterior aspect of the left upper lobe and left lower lobe with marked severity consolidation seen throughout the entire right lower lobe. Moderate size bilateral pleural effusions are seen, right greater than left. No pneumothorax is seen. Upper Abdomen: No acute abnormality. Musculoskeletal: Multilevel degenerative changes seen throughout the thoracic spine Review of the MIP images confirms the above findings. IMPRESSION: 1. No CT evidence of acute pulmonary embolism. 2. Moderate severity left upper lobe and right lower lobe atelectasis and/or infiltrate with marked severity consolidation seen throughout the entire right lower lobe. 3. Moderate size bilateral pleural effusions, right greater than left. Electronically Signed   By: TVirgina NorfolkM.D.   On: 09/08/2019 22:13   Portable Chest xray  Result Date: 09/09/2019 CLINICAL DATA:  Respiratory failure EXAM: PORTABLE CHEST 1 VIEW COMPARISON:  CTA chest dated 09/08/2019 FINDINGS: Endotracheal tube terminates 2.5 cm above the carina. Enteric tube courses into the stomach. Moderate layering right pleural effusion. Associated right lower lobe opacity, likely atelectasis. Small left pleural effusion. Pulmonary vascular congestion without frank interstitial edema. Cardiomegaly. IMPRESSION: Endotracheal tube terminates 2.5 cm above the carina. Cardiomegaly with pulmonary vascular congestion. No frank interstitial edema. Moderate right and small left pleural effusions. Associated right lower lobe opacity, likely atelectasis. Electronically Signed   By: SJulian HyM.D.   On: 09/09/2019 07:10   DG CHEST PORT 1 VIEW  Result Date: 09/08/2019 CLINICAL DATA:  Endotracheal tube  placement EXAM: PORTABLE CHEST 1 VIEW COMPARISON:  09/18/2018.  09/08/2019 FINDINGS: The heart size remains enlarged. Endotracheal tube terminates approximately 3.7 cm above the carina. The enteric tube extends below the left hemidiaphragm. Moderate to large bilateral pleural effusions are noted. Bibasilar airspace disease is noted which is favored to represent atelectasis. There is no pneumothorax. Vascular congestion is noted. IMPRESSION: 1. Lines and tubes as above. 2. Persistent findings of congestive heart failure with moderate to large bilateral pleural effusions. Electronically Signed   By: CConstance HolsterM.D.   On: 09/08/2019 17:23   DG CHEST PORT 1 VIEW  Result Date: 09/08/2019 CLINICAL DATA:  Status post intubation. EXAM: PORTABLE CHEST 1 VIEW COMPARISON:  September 06, 2019. FINDINGS: An endotracheal tube is seen with its distal tip approximately 1.7 cm from the carina. This represents a new finding when compared to the prior study. A nasogastric tube is seen with its distal end extending below the level of the diaphragm. This also represents a new finding. There is no evidence of focal consolidation, pleural effusion or pneumothorax. The cardiac silhouette is moderately enlarged. Multilevel degenerative changes seen throughout the thoracic spine. IMPRESSION: 1. Interval endotracheal tube and nasogastric tube placement and positioning, as described above, when compared to the prior study dated September 06, 2019. Electronically Signed   By: Virgina Norfolk M.D.   On: 09/08/2019 16:24        Scheduled Meds: . acetaminophen  325 mg Per NG tube BID  . aspirin  81 mg Per NG tube q morning - 10a  . atorvastatin  20 mg Per NG tube q1800  . [START ON 09/10/2019] calcitRIOL  0.25 mcg Per NG tube Q M,W,F  . carvedilol  6.25 mg Per NG tube BID WC  . chlorhexidine gluconate (MEDLINE KIT)  15 mL Mouth Rinse BID  . Chlorhexidine Gluconate Cloth  6 each Topical Daily  . cholecalciferol  4,000 Units  Oral q morning - 10a  . darbepoetin (ARANESP) injection - NON-DIALYSIS  150 mcg Subcutaneous Q Thu-1800  . fentaNYL (SUBLIMAZE) injection  25 mcg Intravenous Once  . furosemide  80 mg Intravenous Q12H  . gabapentin  100 mg Oral Q12H  . heparin  5,000 Units Subcutaneous Q8H  . influenza vaccine adjuvanted  0.5 mL Intramuscular Tomorrow-1000  . insulin aspart  0-6 Units Subcutaneous Q4H  . insulin glargine  16 Units Subcutaneous QHS  . levothyroxine  25 mcg Per NG tube Q0600  . mouth rinse  15 mL Mouth Rinse 10 times per day  . montelukast  10 mg Per Tube QHS  . multivitamin  15 mL Per Tube Daily  . neomycin-bacitracin-polymyxin  1 application Topical Daily  . pantoprazole (PROTONIX) IV  40 mg Intravenous Daily  . potassium chloride  40 mEq Per Tube Once  . primidone  50 mg Per NG tube q morning - 10a  . pyridOXINE  200 mg Per NG tube BID  . risperiDONE  0.5 mg Per NG tube BID  . sodium chloride flush  3 mL Intravenous Q12H  . traZODone  50 mg Per NG tube Daily   Continuous Infusions: . sodium chloride    . fentaNYL infusion INTRAVENOUS       LOS: 3 days    Critical care procedure note Authorized and performed by: Kathie Dike Total critical care time: Approximately 30 minutes Due to high probability of clinically significant, life-threatening deterioration, the patient required my highest level of preparedness to intervene emergently and I personally spent this critical care time directly and personally managing the patient.  The critical care time included obtaining a history, examining the patient, pulse oximetry, ordering and review of studies, arranging urgent treatment with development of a management plan, evaluation of patient's response to treatment, frequent reassessment, discussions with other providers.  Critical care time was performed to assess and manage the high probability of imminent, life-threatening deterioration that could result in multiorgan failure.  It was  exclusive of separate billable procedures and treating other patients and teaching time.  Please see MDM section and the rest of the of note for further information on patient assessment and treatment     Kathie Dike, MD [facility]   If 7PM-7AM, please  contact night-coverage [email or web address]  09/09/2019, 10:03 AM

## 2019-09-10 ENCOUNTER — Inpatient Hospital Stay (HOSPITAL_COMMUNITY): Payer: Medicare Other

## 2019-09-10 DIAGNOSIS — I471 Supraventricular tachycardia: Secondary | ICD-10-CM | POA: Diagnosis present

## 2019-09-10 DIAGNOSIS — J9602 Acute respiratory failure with hypercapnia: Secondary | ICD-10-CM | POA: Diagnosis not present

## 2019-09-10 DIAGNOSIS — Z9911 Dependence on respirator [ventilator] status: Secondary | ICD-10-CM

## 2019-09-10 DIAGNOSIS — D649 Anemia, unspecified: Secondary | ICD-10-CM | POA: Diagnosis not present

## 2019-09-10 DIAGNOSIS — I5032 Chronic diastolic (congestive) heart failure: Secondary | ICD-10-CM

## 2019-09-10 DIAGNOSIS — J9601 Acute respiratory failure with hypoxia: Secondary | ICD-10-CM | POA: Diagnosis not present

## 2019-09-10 DIAGNOSIS — G9341 Metabolic encephalopathy: Secondary | ICD-10-CM

## 2019-09-10 DIAGNOSIS — E8770 Fluid overload, unspecified: Secondary | ICD-10-CM

## 2019-09-10 DIAGNOSIS — E8779 Other fluid overload: Secondary | ICD-10-CM | POA: Diagnosis not present

## 2019-09-10 DIAGNOSIS — N189 Chronic kidney disease, unspecified: Secondary | ICD-10-CM | POA: Diagnosis not present

## 2019-09-10 DIAGNOSIS — R0602 Shortness of breath: Secondary | ICD-10-CM | POA: Diagnosis not present

## 2019-09-10 DIAGNOSIS — Z20822 Contact with and (suspected) exposure to covid-19: Secondary | ICD-10-CM | POA: Diagnosis not present

## 2019-09-10 DIAGNOSIS — N179 Acute kidney failure, unspecified: Secondary | ICD-10-CM | POA: Diagnosis not present

## 2019-09-10 LAB — RENAL FUNCTION PANEL
Albumin: 2 g/dL — ABNORMAL LOW (ref 3.5–5.0)
Anion gap: 14 (ref 5–15)
BUN: 69 mg/dL — ABNORMAL HIGH (ref 8–23)
CO2: 23 mmol/L (ref 22–32)
Calcium: 7.7 mg/dL — ABNORMAL LOW (ref 8.9–10.3)
Chloride: 102 mmol/L (ref 98–111)
Creatinine, Ser: 6.93 mg/dL — ABNORMAL HIGH (ref 0.44–1.00)
GFR calc Af Amer: 7 mL/min — ABNORMAL LOW (ref >60)
GFR calc non Af Amer: 6 mL/min — ABNORMAL LOW (ref >60)
Glucose, Bld: 129 mg/dL — ABNORMAL HIGH (ref 70–99)
Phosphorus: 6.5 mg/dL — ABNORMAL HIGH (ref 2.5–4.6)
Potassium: 3.7 mmol/L (ref 3.5–5.1)
Sodium: 139 mmol/L (ref 135–145)

## 2019-09-10 LAB — GLUCOSE, CAPILLARY
Glucose-Capillary: 100 mg/dL — ABNORMAL HIGH (ref 70–99)
Glucose-Capillary: 101 mg/dL — ABNORMAL HIGH (ref 70–99)
Glucose-Capillary: 105 mg/dL — ABNORMAL HIGH (ref 70–99)
Glucose-Capillary: 149 mg/dL — ABNORMAL HIGH (ref 70–99)
Glucose-Capillary: 149 mg/dL — ABNORMAL HIGH (ref 70–99)
Glucose-Capillary: 67 mg/dL — ABNORMAL LOW (ref 70–99)
Glucose-Capillary: 77 mg/dL (ref 70–99)
Glucose-Capillary: 93 mg/dL (ref 70–99)

## 2019-09-10 LAB — BLOOD GAS, ARTERIAL
Acid-Base Excess: 1.4 mmol/L (ref 0.0–2.0)
Bicarbonate: 25.6 mmol/L (ref 20.0–28.0)
FIO2: 40
O2 Saturation: 93.2 %
Patient temperature: 37.5
pCO2 arterial: 41 mmHg (ref 32.0–48.0)
pH, Arterial: 7.412 (ref 7.350–7.450)
pO2, Arterial: 66 mmHg — ABNORMAL LOW (ref 83.0–108.0)

## 2019-09-10 LAB — TYPE AND SCREEN
ABO/RH(D): A POS
Antibody Screen: NEGATIVE
Unit division: 0

## 2019-09-10 LAB — MAGNESIUM: Magnesium: 2.3 mg/dL (ref 1.7–2.4)

## 2019-09-10 LAB — BPAM RBC
Blood Product Expiration Date: 202102222359
ISSUE DATE / TIME: 202101241457
Unit Type and Rh: 6200

## 2019-09-10 LAB — CBC
HCT: 22.6 % — ABNORMAL LOW (ref 36.0–46.0)
Hemoglobin: 7.4 g/dL — ABNORMAL LOW (ref 12.0–15.0)
MCH: 32.3 pg (ref 26.0–34.0)
MCHC: 32.7 g/dL (ref 30.0–36.0)
MCV: 98.7 fL (ref 80.0–100.0)
Platelets: 126 10*3/uL — ABNORMAL LOW (ref 150–400)
RBC: 2.29 MIL/uL — ABNORMAL LOW (ref 3.87–5.11)
RDW: 14.5 % (ref 11.5–15.5)
WBC: 10.2 10*3/uL (ref 4.0–10.5)
nRBC: 0 % (ref 0.0–0.2)

## 2019-09-10 MED ORDER — ADENOSINE 6 MG/2ML IV SOLN
INTRAVENOUS | Status: AC
  Administered 2019-09-10: 6 mg
  Filled 2019-09-10: qty 6

## 2019-09-10 MED ORDER — DEXTROSE 50 % IV SOLN
INTRAVENOUS | Status: AC
  Administered 2019-09-10: 50 mL
  Filled 2019-09-10: qty 50

## 2019-09-10 MED ORDER — METOPROLOL TARTRATE 25 MG PO TABS: 12.5000 mg | ORAL_TABLET | Freq: Two times a day (BID) | ORAL | Status: DC

## 2019-09-10 MED ORDER — METOPROLOL TARTRATE 12.5 MG HALF TABLET
12.5000 mg | ORAL_TABLET | Freq: Two times a day (BID) | ORAL | Status: DC
  Administered 2019-09-10 – 2019-09-17 (×12): 12.5 mg
  Filled 2019-09-10 (×12): qty 1

## 2019-09-10 MED ORDER — VITAMIN D 25 MCG (1000 UNIT) PO TABS
4000.0000 [IU] | ORAL_TABLET | Freq: Every morning | ORAL | Status: DC
  Administered 2019-09-10 – 2019-09-20 (×9): 4000 [IU] via NASOGASTRIC
  Filled 2019-09-10 (×9): qty 4

## 2019-09-10 MED ORDER — SODIUM CHLORIDE 0.9 % IV SOLN
25.0000 ug/h | INTRAVENOUS | Status: DC
  Administered 2019-09-10: 100 ug/h via INTRAVENOUS
  Filled 2019-09-10 (×2): qty 50

## 2019-09-10 MED ORDER — METOPROLOL TARTRATE 5 MG/5ML IV SOLN
5.0000 mg | INTRAVENOUS | Status: DC | PRN
  Administered 2019-09-17: 17:00:00 5 mg via INTRAVENOUS
  Filled 2019-09-10: qty 5

## 2019-09-10 NOTE — Progress Notes (Signed)
[name] paged and made aware of HD femoral site that is bleeding. Adv that patient is receiving Heparin. Need order to possibly hold. Waiting for orders/call back.Will continue to monitor pt

## 2019-09-10 NOTE — Progress Notes (Signed)
Heparin held per MD order d/t patient bleeding from HD cath site that was placed yesterday. SCDs place for VTE prophylaxis.

## 2019-09-10 NOTE — Procedures (Signed)
    NEPHROLOGY NURSING NOTE:  Appreciate vas-cath placement by Dr. Constance Haw.  Both cath limbs were flushed with NS and locked with Heparin 1:100.  Rockwell Alexandria, RN

## 2019-09-10 NOTE — TOC Initial Note (Signed)
Transition of Care [facility]) - Initial/Assessment Note    Patient Details  Name: [name] MRN: [id number] Date of Birth: Jan 06, 1951  Transition of Care [facility]) CM/SW Contact:    Shade Flood, LCSW Phone Number: 09/10/2019, 9:39 AM  Clinical Narrative:                  Pt admitted from [facility]. Pt currently intubated and sedated. MD notes indicate that pt does not have any family or designated decision makers. Pt needs HD access per MD. Prior to needing to be intubated, pt was alert and oriented and able to express her wishes to the physicians. Nephrology note on 09/06/19 indicates that pt told him that she DID want to pursue dialysis.   Surgeon's note indicates pt will need temporary catheter placed unless decision maker is identified who can consent to tunneled catheter. So far, MD's have been speaking to the owner of [place]) for information as she is the only person listed on pt's emergency contacts. Per Dr. Denton Brick, pt will likely be extubated tomorrow and he expects that she will be able to give consents and communicate her preferences at that time. If not, DSS can assist with trying to locate family or with temporary guardianship if needed.  Once pt able to indicate her preference, referral to outpatient HD clinic can be started. Per notes, pt was previously seen by Dr. Hinda Lenis but Adonis Brook at [facility] states that pt hasn't been to a nephrology clinic in the whole time she has lived with them (since February of 2020). Adonis Brook also states that they won't be able to transport pt to dialysis so transportation will need to be arranged prior to dc.  Will follow and assist as needed.   Barriers to Discharge: Continued Medical Work up   Patient Goals and CMS Choice        Expected Discharge Plan and Services   In-house Referral: Clinical Social Work     Living arrangements for the past 2 months: Group Home                                       Prior Living Arrangements/Services Living arrangements for the past 2 months: [place] Lives with:: Facility Resident Patient language and need for interpreter reviewed:: Yes Do you feel safe going back to the place where you live?: Yes      Need for Family Participation in Patient Care: No (Comment) Care giver support system in place?: Yes (comment)   Criminal Activity/Legal Involvement Pertinent to Current Situation/Hospitalization: No - Comment as needed  Activities of Daily Living Home Assistive Devices/Equipment: Gilford Rile (specify type) ADL Screening (condition at time of admission) Patient's cognitive ability adequate to safely complete daily activities?: Yes Is the patient deaf or have difficulty hearing?: No Does the patient have difficulty seeing, even when wearing glasses/contacts?: No Does the patient have difficulty concentrating, remembering, or making decisions?: No Patient able to express need for assistance with ADLs?: Yes Does the patient have difficulty dressing or bathing?: Yes Independently performs ADLs?: No Communication: Independent Dressing (OT): Needs assistance Is this a change from baseline?: Pre-admission baseline Grooming: Needs assistance Is this a change from baseline?: Pre-admission baseline Feeding: Independent Bathing: Needs assistance Is this a change from baseline?: Pre-admission baseline Toileting: Needs assistance Is this a change from baseline?: Pre-admission baseline In/Out Bed: Needs assistance Is this a  change from baseline?: Pre-admission baseline Walks in Home: Needs assistance Is this a change from baseline?: Pre-admission baseline Does the patient have difficulty walking or climbing stairs?: Yes Weakness of Legs: Both Weakness of Arms/Hands: None  Permission Sought/Granted                  Emotional Assessment       Orientation: : Oriented to Self Alcohol / Substance Use: Not Applicable Psych  Involvement: No (comment)  Admission diagnosis:  Acute pulmonary edema (HCC) [J81.0] Volume overload [E87.70] AKI (acute kidney injury) ([place]) [N17.9] Acute renal failure superimposed on chronic kidney disease, unspecified CKD stage, unspecified acute renal failure type ([place]) [N17.9, N18.9] Patient Active Problem List   Diagnosis Date Noted  . On mechanically assisted ventilation ([place]) 09/08/2019  . Acute metabolic encephalopathy [id number]  . Volume overload 09/06/2019  . AKI (acute kidney injury) ([place]) 09/06/2019  . Altered mental state 11/28/2018  . Near syncope 11/28/2018  . Fall at home 11/28/2018  . History of seizure 11/28/2018  . 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 Pressure injury of skin 09/17/2018  . Respiratory failure ([place]) 08/16/2018  . Seizure ([place]) 08/16/2018  . Acute encephalopathy 07/27/2018  . Endotracheally intubated 07/27/2018  . Hypothyroidism 07/27/2018  . Pleuritic chest pain 07/14/2018  . Moderate persistent asthma without complication   . Type 2 diabetes mellitus ([place]) 04/19/2018  . Acute kidney injury superimposed on chronic kidney disease ([place]) 01/13/2018  . Altered mental status   . Encephalopathy 10/11/2016  . Chest pain 08/12/2016  . CKD (chronic kidney disease), stage IV ([place]) 08/12/2016  . History of pulmonary embolus (PE) 08/12/2016  . Obesity, Class III, BMI 40-49.9 (morbid obesity) ([place]) 08/12/2016  . RBBB 08/12/2016  . Positive D dimer 08/12/2016  . Cerebral infarction ([place]) 11/10/2015  . Physical deconditioning 07/01/2011  . Weakness 07/01/2011  . Diabetic foot ulcer ([place]) 05/19/2011  . Polypharmacy 02/09/2011  . BACK PAIN WITH RADICULOPATHY 05/25/2010  . Type 2 diabetes mellitus with diabetic polyneuropathy, without long-term current use of insulin ([place]) 08/18/2009  . Chronic diastolic heart failure ([place]) 04/25/2009  . Normocytic anemia 04/14/2009  . Sleep apnea 12/11/2008  . Diabetic peripheral neuropathy ([place]) 05/04/2007   . HYPERCHOLESTEROLEMIA 10/13/2006  . Gout, unspecified 10/13/2006  . HYPOKALEMIA 10/13/2006  . Schizophrenia ([place]) 10/13/2006  . Depression 10/13/2006  . Essential hypertension 10/13/2006  . Venous (peripheral) insufficiency 10/13/2006  . RHINITIS, ALLERGIC 10/13/2006  . Asthma 10/13/2006  . Reflux esophagitis 10/13/2006  . OSTEOARTHRITIS, MULTI SITES 10/13/2006  . INCONTINENCE, URGE 10/13/2006   PCP:  Glenda Chroman, MD Pharmacy:   [facility] [address], [place] [address] Phone: [phone] Fax: [place], [place] - [place] [address] Phone: [phone] Fax: [phone]  [place], [place] - [place] [address] Phone: [phone] Fax: [phone]     Social Determinants of Health ([place]) Interventions    Readmission Risk Interventions Readmission Risk Prevention Plan 09/10/2019 11/29/2018  Transportation Screening Complete Complete  Medication Review ([place]) Complete Complete  PCP or Specialist appointment within 3-5 days of discharge - Not Complete  PCP/Specialist Appt Not Complete comments - earliest appt available was 12/08/18  [place] or [place] - Complete  SW Recovery Care/Counseling Consult - Complete  [place] - Not Applicable  Some recent data might be hidden

## 2019-09-10 NOTE — Progress Notes (Signed)
Patient ID: [name], female   DOB: 09-Apr-1951, 69 y.o.   MRN: [id number] S: Pt intubated O:BP (!) 121/56   Pulse 88   Temp (!) 100.6 F (38.1 C) (Axillary) Comment: notified nurse  Resp 20   Ht _0  (1.575 m)   Wt 99.9 kg   SpO2 96%   BMI 40.28 kg/m   Intake/Output Summary (Last 24 hours) at 09/10/2019 1027 Last data filed at 09/10/2019 0841 Gross per 24 hour  Intake 726.78 ml  Output 1000 ml  Net -273.22 ml   Intake/Output: I/O last 3 completed shifts: In: 723.8 [I.V.:162.8; Blood:371; NG/GT:60; IV Piggyback:129.9] Out: 2426 [Urine:1850]  Intake/Output this shift:  Total I/O In: 3 [I.V.:3] Out: -  Weight change:  Gen: intubated and sedated CVS: tachy Resp: occ rhonchi Abd: +BS, soft Ext: 1+ pitting edema  Recent Labs  Lab 09/06/19 0343 09/07/19 0532 09/08/19 0525 09/08/19 1544 09/09/19 0424 09/10/19 0406  NA 142 140 138 138 138 139  K 4.0 3.6 3.5 4.3 3.2* 3.7  CL 104 101 99 99 101 102  CO2 _1 GLUCOSE 148* 104* 110* 223* 100* 129*  BUN 62* 64* 68* 70* 70* 69*  CREATININE 6.34* 6.47* 6.26* 6.75* 6.42* 6.93*  ALBUMIN 2.6* 2.5* 2.5*  --  2.3* 2.0*  CALCIUM 8.2* 7.9* 8.0* 8.2* 8.1* 7.7*  PHOS  --  6.9* 6.8*  --  6.4* 6.5*  AST 19  --   --   --   --   --   ALT 20  --   --   --   --   --    Liver Function Tests: Recent Labs  Lab 09/06/19 0343 09/07/19 0532 09/08/19 0525 09/09/19 0424 09/10/19 0406  AST 19  --   --   --   --   ALT 20  --   --   --   --   ALKPHOS 73  --   --   --   --   BILITOT 0.5  --   --   --   --   PROT 5.8*  --   --   --   --   ALBUMIN 2.6*   < > 2.5* 2.3* 2.0*   < > = values in this interval not displayed.   No results for input(s): LIPASE, AMYLASE in the last 168 hours. No results for input(s): AMMONIA in the last 168 hours. CBC: Recent Labs  Lab 09/06/19 0343 09/06/19 0343 09/07/19 0532 09/07/19 0532 09/08/19 1544 09/09/19 0424 09/10/19 0406  WBC 5.9   < > 6.7   < > 6.7 9.0 10.2  NEUTROABS 5.1   --   --   --   --   --   --   HGB 7.5*   < > 7.2*   < > 8.7* 7.0* 7.4*  HCT 22.9*   < > 22.1*   < > 26.3* 20.9* 22.6*  MCV 100.0  --  100.5*  --  100.4* 97.7 98.7  PLT 155   < > 159   < > 171 155 126*   < > = values in this interval not displayed.   Cardiac Enzymes: No results for input(s): CKTOTAL, CKMB, CKMBINDEX, TROPONINI in the last 168 hours. CBG: Recent Labs  Lab 09/09/19 2019 09/09/19 2358 09/10/19 0341 09/10/19 0358 09/10/19 0749  GLUCAP 85 77 67* 149* 93    Iron Studies: No results for input(s): IRON, TIBC, TRANSFERRIN, FERRITIN in the last  72 hours. Studies/Results: CT HEAD WO CONTRAST  Result Date: 09/08/2019 CLINICAL DATA:  Altered mental status. EXAM: CT HEAD WITHOUT CONTRAST TECHNIQUE: Contiguous axial images were obtained from the base of the skull through the vertex without intravenous contrast. COMPARISON:  September 15, 2018 FINDINGS: Brain: There is mild cerebral atrophy with widening of the extra-axial spaces and ventricular dilatation. There are areas of decreased attenuation within the white matter tracts of the supratentorial brain, consistent with microvascular disease changes. A chronic left basal ganglia lacunar infarct is noted. Vascular: No hyperdense vessel or unexpected calcification. Skull: Normal. Negative for fracture or focal lesion. Sinuses/Orbits: A stable 2.0 cm x 1.4 cm polyp versus mucous retention cyst is seen within the anterior aspect of the left maxillary sinus. Other: None. IMPRESSION: 1. Generalized cerebral atrophy. 2. No acute intracranial abnormality. 3. Chronic left basal ganglia lacunar infarct. 4. Stable left maxillary sinus polyp versus mucous retention cyst. Electronically Signed   By: Virgina Norfolk M.D.   On: 09/08/2019 16:48   CT ANGIO CHEST PE W OR WO CONTRAST  Result Date: 09/08/2019 CLINICAL DATA:  Severe hypoxia. EXAM: CT ANGIOGRAPHY CHEST WITH CONTRAST TECHNIQUE: Multidetector CT imaging of the chest was performed using the  standard protocol during bolus administration of intravenous contrast. Multiplanar CT image reconstructions and MIPs were obtained to evaluate the vascular anatomy. CONTRAST:  13m OMNIPAQUE IOHEXOL 300 MG/ML  SOLN COMPARISON:  Jan 12, 2018 FINDINGS: Cardiovascular: There is mild calcification of the aortic arch. Satisfactory opacification of the pulmonary arteries to the segmental level. No evidence of pulmonary embolism. There is mild, stable cardiomegaly. No pericardial effusion. Marked severity coronary artery calcification is seen. Mediastinum/Nodes: No enlarged mediastinal, hilar, or axillary lymph nodes. Thyroid gland, trachea, and esophagus demonstrate no significant findings. Lungs/Pleura: Endotracheal and nasogastric tubes are present. Moderate severity atelectasis and/or infiltrate is seen within the posterior aspect of the left upper lobe and left lower lobe with marked severity consolidation seen throughout the entire right lower lobe. Moderate size bilateral pleural effusions are seen, right greater than left. No pneumothorax is seen. Upper Abdomen: No acute abnormality. Musculoskeletal: Multilevel degenerative changes seen throughout the thoracic spine Review of the MIP images confirms the above findings. IMPRESSION: 1. No CT evidence of acute pulmonary embolism. 2. Moderate severity left upper lobe and right lower lobe atelectasis and/or infiltrate with marked severity consolidation seen throughout the entire right lower lobe. 3. Moderate size bilateral pleural effusions, right greater than left. Electronically Signed   By: TVirgina NorfolkM.D.   On: 09/08/2019 22:13   DG CHEST PORT 1 VIEW  Result Date: 09/10/2019 CLINICAL DATA:  Acute respiratory failure with hypoxia EXAM: PORTABLE CHEST 1 VIEW COMPARISON:  Yesterday FINDINGS: Small pleural effusions with superimposed lower lobe atelectasis by most recent chest CT. Cardiomegaly. Endotracheal tube tip is at the clavicular heads, slightly  higher than before. The enteric tube at least reaches the stomach. IMPRESSION: Continued low volume chest with layering effusions and lower lobe atelectasis. Electronically Signed   By: JMonte FantasiaM.D.   On: 09/10/2019 05:45   Portable Chest xray  Result Date: 09/09/2019 CLINICAL DATA:  Respiratory failure EXAM: PORTABLE CHEST 1 VIEW COMPARISON:  CTA chest dated 09/08/2019 FINDINGS: Endotracheal tube terminates 2.5 cm above the carina. Enteric tube courses into the stomach. Moderate layering right pleural effusion. Associated right lower lobe opacity, likely atelectasis. Small left pleural effusion. Pulmonary vascular congestion without frank interstitial edema. Cardiomegaly. IMPRESSION: Endotracheal tube terminates 2.5 cm above the carina. Cardiomegaly with pulmonary  vascular congestion. No frank interstitial edema. Moderate right and small left pleural effusions. Associated right lower lobe opacity, likely atelectasis. Electronically Signed   By: Julian Hy M.D.   On: 09/09/2019 07:10   DG CHEST PORT 1 VIEW  Result Date: 09/08/2019 CLINICAL DATA:  Endotracheal tube placement EXAM: PORTABLE CHEST 1 VIEW COMPARISON:  09/18/2018.  09/08/2019 FINDINGS: The heart size remains enlarged. Endotracheal tube terminates approximately 3.7 cm above the carina. The enteric tube extends below the left hemidiaphragm. Moderate to large bilateral pleural effusions are noted. Bibasilar airspace disease is noted which is favored to represent atelectasis. There is no pneumothorax. Vascular congestion is noted. IMPRESSION: 1. Lines and tubes as above. 2. Persistent findings of congestive heart failure with moderate to large bilateral pleural effusions. Electronically Signed   By: Constance Holster M.D.   On: 09/08/2019 17:23   DG CHEST PORT 1 VIEW  Result Date: 09/08/2019 CLINICAL DATA:  Status post intubation. EXAM: PORTABLE CHEST 1 VIEW COMPARISON:  September 06, 2019. FINDINGS: An endotracheal tube is seen  with its distal tip approximately 1.7 cm from the carina. This represents a new finding when compared to the prior study. A nasogastric tube is seen with its distal end extending below the level of the diaphragm. This also represents a new finding. There is no evidence of focal consolidation, pleural effusion or pneumothorax. The cardiac silhouette is moderately enlarged. Multilevel degenerative changes seen throughout the thoracic spine. IMPRESSION: 1. Interval endotracheal tube and nasogastric tube placement and positioning, as described above, when compared to the prior study dated September 06, 2019. Electronically Signed   By: Virgina Norfolk M.D.   On: 09/08/2019 16:24   . acetaminophen  325 mg Per NG tube BID  . aspirin  81 mg Per NG tube q morning - 10a  . atorvastatin  20 mg Per NG tube q1800  . calcitRIOL  0.25 mcg Per NG tube Q M,W,F  . carvedilol  6.25 mg Per NG tube BID WC  . chlorhexidine gluconate (MEDLINE KIT)  15 mL Mouth Rinse BID  . Chlorhexidine Gluconate Cloth  6 each Topical Daily  . Chlorhexidine Gluconate Cloth  6 each Topical Q0600  . cholecalciferol  4,000 Units Per NG tube q morning - 10a  . darbepoetin (ARANESP) injection - NON-DIALYSIS  150 mcg Subcutaneous Q Thu-1800  . furosemide  80 mg Intravenous Q12H  . heparin  5,000 Units Subcutaneous Q8H  . influenza vaccine adjuvanted  0.5 mL Intramuscular Tomorrow-1000  . insulin aspart  0-6 Units Subcutaneous Q4H  . insulin glargine  16 Units Subcutaneous QHS  . levothyroxine  25 mcg Per NG tube Q0600  . mouth rinse  15 mL Mouth Rinse 10 times per day  . montelukast  10 mg Per Tube QHS  . multivitamin  15 mL Per Tube Daily  . neomycin-bacitracin-polymyxin  1 application Topical Daily  . pantoprazole (PROTONIX) IV  40 mg Intravenous Daily  . primidone  50 mg Per NG tube q morning - 10a  . pyridOXINE  200 mg Per NG tube BID  . risperiDONE  0.5 mg Per NG tube BID  . sodium chloride flush  3 mL Intravenous Q12H  .  traZODone  50 mg Per NG tube Daily    BMET    Component Value Date/Time   NA 139 09/10/2019 0406   K 3.7 09/10/2019 0406   CL 102 09/10/2019 0406   CO2 23 09/10/2019 0406   GLUCOSE 129 (H) 09/10/2019 0406  BUN 69 (H) 09/10/2019 0406   CREATININE 6.93 (H) 09/10/2019 0406   CREATININE 2.54 (H) 08/02/2018 1411   CALCIUM 7.7 (L) 09/10/2019 0406   GFRNONAA 6 (L) 09/10/2019 0406   GFRAA 7 (L) 09/10/2019 0406   CBC    Component Value Date/Time   WBC 10.2 09/10/2019 0406   RBC 2.29 (L) 09/10/2019 0406   HGB 7.4 (L) 09/10/2019 0406   HCT 22.6 (L) 09/10/2019 0406   PLT 126 (L) 09/10/2019 0406   MCV 98.7 09/10/2019 0406   MCH 32.3 09/10/2019 0406   MCHC 32.7 09/10/2019 0406   RDW 14.5 09/10/2019 0406   LYMPHSABS 0.5 (L) 09/06/2019 0343   MONOABS 0.3 09/06/2019 0343   EOSABS 0.0 09/06/2019 0343   BASOSABS 0.0 09/06/2019 0343     Assessment/Plan:  1. AKI/subacute kidney injury vs progressive CKD - underlying CKD due to DM and followed by Dr. Lowanda Foster.  She is volume overloaded and BUN/Cr climbing with diuresis.  Plan was to place HD catheter and perform HD to see if her mental status improves. 1. Due to volume of dialysis patients will not perform HD until tomorrow (waiting on HD catheter placement) 2. Acute hypoxic respiratory failure- acute decompensation on 09/08/19.  She was subsequently intubated and transferred to the ICU.  CT angio negative for PE.  Plan to attempt UF with HD as tolerated 3. Vascular access- per Dr. Constance Haw, she will plan temporary HD catheter since pt cannot give consent for tunneled. 4. Acute on chronic diastolic CHF- volume overloaded and will UF as tolerated 5. Anemia of CKD- on ESA 6. Morbid obesity/OSA- intubated 7. Tachycardia-  Donetta Potts, MD [facility] [phone]

## 2019-09-10 NOTE — Progress Notes (Signed)
At around 1019, patient noted to be tachycardic with heart rate in the 140's on the telemetry monitor. Dr Joesph Fillers made aware and came to assess patient. Patient alert with eyes open, nodding yes/no at times. Fentanyl drip increased and PRN Versed given due to the fact patient was weening on the ventilator and per Dr. Joesph Fillers. Respiratory changed ventilator settings and patient heart rate continued to rise in to the 150's. Dr Constance Haw present and Dr Joesph Fillers called to room. EKG's done. IV Adenosine given @1124  per Dr Courage/Bridges and heart rate dropped in to the 80's. Vitals stable.

## 2019-09-10 NOTE — Progress Notes (Signed)
Initial Nutrition Assessment  DOCUMENTATION CODES:   Obesity unspecified  INTERVENTION:  If unable to wean pt and pt is hemodynamically stable: Initiate Vital 1.2 @ 20 ml/hr via OGT and increase by 10 ml every  hours to goal rate of 40 ml/hr.   Add 30  ml Prostat  BID  .    Tube feeding regimen provides 1352 kcal, 105 grams of protein, and    779 ml of H2O.    Recommend change MVI to Rena-vite   NUTRITION DIAGNOSIS:   Inadequate oral intake related to inability to eat as evidenced by NPO status.  GOAL:  Provide needs based on ASPEN/SCCM guidelines  MONITOR:  Vent status, Labs, Weight trends, Skin, I & O's   REASON FOR ASSESSMENT: mechanical ventilation     ASSESSMENT:  Patient is 69 yo currently intubated on ventilator support (1/23). Bilateral effusions. Hx of  CHF, COPD-presents with shortness of breath. Patient had temporary dialysis line placed today. Acute on chronic renal failure.   Talked with nurse today-patient failed weaning attempt. May be ready by tomorrow to wean.   MV: 7.1  L/min Temp (24hrs), Avg:99.5 F (37.5 C), Min:97.8 F (36.6 C), Max:100.6 F (38.1 C)  Fentanyl: 12.5 ml/hr (1/25)     Intake/Output Summary (Last 24 hours) at 09/10/2019 1546 Last data filed at 09/10/2019 1413 Gross per 24 hour  Intake 668.79 ml  Output 625 ml  Net 43.79 ml  Since Admission -2.397 liters Weight history shows gain (11%) 9.6 kg the past 3 months. Generalized edema upper and lower extremities.   Labs reviewed:  BMP Latest Ref Rng & Units 09/10/2019 09/09/2019 09/08/2019  Glucose 70 - 99 mg/dL 129(H) 100(H) 223(H)  BUN 8 - 23 mg/dL 69(H) 70(H) 70(H)  Creatinine 0.44 - 1.00 mg/dL 6.93(H) 6.42(H) 6.75(H)  BUN/Creat Ratio 6 - 22 (calc) - - -  Sodium 135 - 145 mmol/L 139 138 138  Potassium 3.5 - 5.1 mmol/L 3.7 3.2(L) 4.3  Chloride 98 - 111 mmol/L 102 101 99  CO2 22 - 32 mmol/L 23 25 27   Calcium 8.9 - 10.3 mg/dL 7.7(L) 8.1(L) 8.2(L)    Medications reviewed and  include:Lasix, Insulin, lopressor,  lipitor, calcitriol, vitamin D3, protonix, MVI.  NUTRITION - FOCUSED PHYSICAL EXAM:  Unable to complete Nutrition-Focused physical exam at this time.    Diet Order:   Diet Order            Diet NPO time specified  Diet effective now              EDUCATION NEEDS: none identified  No education needs have been identified at this time Skin:  Skin Assessment: Reviewed RN Assessment  Last BM:  unknown  Height:   Ht Readings from Last 1 Encounters:  09/10/19 5\' 2"  (1.575 m)    Weight:   Wt Readings from Last 1 Encounters:  09/09/19 99.9 kg    Ideal Body Weight:   50 kg  BMI:  Body mass index is 40.28 kg/m.  Estimated Nutritional Needs:   Kcal:  1452  Protein:  100-110 gr  Fluid:  per MD goals   Colman Cater MS,RD,CSG,LDN Office: [phone] Pager: [phone]

## 2019-09-10 NOTE — Procedures (Signed)
Procedure Note  09/10/19   Preoperative Diagnosis: End stage renal disease requiring dialysis initiation    Postoperative Diagnosis: Same   Procedure(s) Performed: Temporary Dialysis line placement, left femoral under ultrasound guidance     Surgeon: Lanell Matar. Constance Haw, MD   Assistants: None   Anesthesia: 1% lidocaine    Complications: None    Indications: [name] is a 69 y.o. with ESRD who needs to start dialysis and had expressed to nephrology desire to start dialysis and then required intubation for respiratory distress and is sedated. She still needs dialysis and has no family or decision maker. Social work has attempted to determine the next best plan for getting consent, but given the patient's need for dialysis and prior wishes, we opted to place a temporary dialysis catheter in place with myself and Dr. Roxan Hockey agreeing it was medically necessary and proceeding with out official consent.   Procedure: The patient placed supine. The left groin was prepped and draped in the usual sterile fashion.  Wearing full gown and gloves, I performed the procedure.  One percent lidocaine was used for local anesthesia. An ultrasound was utilized to assess the femora  vein.  The needle with syringe was advanced into the vein with dark venous return, and a wire was placed using the Seldinger technique without difficulty. The wire was verified to be in the vein and not traversing any other structures.  The skin was knicked and a dilator was placed, and the two lumen dialysis catheter was placed over the wire with continued control of the wire.  There was good draw back of blood from all lumens and each flushed easily with saline.  The catheter was secured in 2 points with 2-0 silk and a biopatch and dressing was placed.     The patient tolerated the procedure well. The dialysis RN is aware of the catheter being placed and is going to flush it with heparin.   Curlene Labrum, MD [facility] [address] [place], [place] [address] [phone] (office)

## 2019-09-10 NOTE — Progress Notes (Addendum)
Hypoglycemic Event  CBG: 67  Treatment: dextose 50%  Symptoms: n/a- patient sedated on ventilator   Follow-up CBG: Time:0400 CBG Result: 149  Possible Reasons for Event: Pt NPO  Comments/MD notified:n/a    Tim Lair

## 2019-09-10 NOTE — Progress Notes (Signed)
PROGRESS NOTE    [name]  RXV:[id number] DOB: Dec 27, 1950 DOA: 09/06/2019 PCP: Glenda Chroman, MD    Brief Narrative:  69 year old female with history of hypertension, diabetes, diastolic heart failure, schizoaffective disorder, chronic kidney disease stage IV, who presented to the emergency room with acute kidney injury on chronic kidney disease and creatinine of 6.3.  She was noted to be volume overload.  She is admitted to the hospital, started on intravenous Lasix and is being followed by nephrology.  She has not had any significant improvement in renal function and is felt that she will likely need hemodialysis.  Plans were to start hemodialysis on 1/25.  On 1/23, patient had a sudden change in mental status.  She had increased respiratory effort and blood gas showed respiratory acidosis.  Patient was transferred to the ICU and was intubated.  Further work-up for mental status change including CT head has been ordered. -Remains intubated and sedated   Assessment & Plan:   Principal Problem:   Acute kidney injury superimposed on chronic kidney disease (HCC) Active Problems:   Volume overload   SVT (supraventricular tachycardia) (HCC)   Type 2 diabetes mellitus with diabetic polyneuropathy, without long-term current use of insulin (HCC)   Normocytic anemia   Schizophrenia (HCC)   Depression   Essential hypertension   Chronic diastolic heart failure (HCC)   CKD (chronic kidney disease), stage IV (HCC)   Obesity, Class III, BMI 40-49.9 (morbid obesity) ([place])   Type 2 diabetes mellitus ([place])   Hypothyroidism   On mechanically assisted ventilation (HCC)   Acute metabolic encephalopathy   Acute respiratory failure with hypoxia and hypercapnia (HCC)    1)Acute respiratory failure with hypoxia and hypercapnia--multifactorial etiology including volume overload in the setting of ESRD-Intubated 09/08/2019  -PRVC mode/tidal volume 400/PEEP 5/FiO2 40%/RR 20 -PCCM consult  appreciated -Currently on fentanyl drip and as needed IV Versed -Keep intubated until volume status improves with HD -Continue Levaquin -CTA chest without PE  2)AKI----acute kidney injury on CKD stage -IV Versus Progression towards CKD 5,GFR is now less than 10, -had uremic symptoms prior to intubation --Creatinine is up to 6.93, creatinine was 3.1 on 06/11/2019 and creatinine was 2.1 on 10/09/2018 -renal ultrasound without obstructive uropathy there is evidence of bilateral medical renal disease -Nephrology consult appreciated - patient was agreeable to undergo hemodialysis prior to intubation -Stop PTA torsemide, Failed on high-dose IV Lasix -renally adjust medications, avoid nephrotoxic agents / dehydration / hypotension -PTH   3) PSVT----had episode of paroxysmal SVT on 09/10/2019 responded well to IV Cardizem, start metoprolol p.o. as ordered  4)HFpEF--last known EF 60 to 65% based on echo from January 2020, patient had diastolic dysfunction -Failed IV Lasix, BP will allow for Coreg, metoprolol as ordered  5)HTN-stage II--amlodipine discontinued due to edema and to allow room to titrate diuretics, discontinue prazosin, Coreg discontinued due to soft BP  6)DM2with peripheral neuropathy--A1c is 5.2 reflecting excellent diabetic control PTA -PTA patient was on Tradjenta,Trulicity discontinued, and Lantus insulin  -Allow some permissive Hyperglycemia rather than risk life-threatening hypoglycemia in a patient with unreliable oral intake. Use Novolog/Humalog Sliding scale insulin with Accu-Cheks/Fingersticks as ordered  7)Hypothyroidism--continue levothyroxine 25 mcg daily,TSH was 2.9 in April 2020,  8))Neuropsychiatric disorders---underlying schizoaffective disorder, as well as questionable seizure attacks in the past, followed by [name] as her neurologist,who does not believe the patient has genuine seizures---PTA was on Lamictal for mood stabilization,  trazodone 50 mg nightly,,Risperdal 0.5 mg twice daily, vitamin B6, Mysoline 50 mg  daily, and as needed lorazepam  7)Morbid obesity/OSA---PTA was on CPAP nightly , currently intubated  8)Anemia of CKD---ESA/EPO agent per nephrology team, ferritin 19 and TIBC 241 and serum iron is 62, check stool occult blood  Disposition/Need for in-Hospital Stay- -currently intubated for hypoxic and hypercapnic respiratory failure -Will  need hemodialysis initiation and clipping process prior to discharge  Code Status : Full  Family Communication:   na   Consults  :  Neprhology/general surgery/PCCM  DVT Prophylaxis  :   - Heparin - SCDs CRITICAL CARE Performed by: Roxan Hockey   Total critical care time: 46 minutes  Critical care time was exclusive of separately billable procedures and treating other patients.  Critical care was necessary to treat or prevent imminent or life-threatening deterioration. -PRVC mode/tidal volume 400/PEEP 5/FiO2 40%/RR 20  Critical care was time spent personally by me on the following activities: development of treatment plan with patient and/or surrogate as well as nursing, discussions with consultants, evaluation of patient's response to treatment, examination of patient, obtaining history from patient or surrogate, ordering and performing treatments and interventions, ordering and review of laboratory studies, ordering and review of radiographic studies, pulse oximetry and re-evaluation of patient's condition.   DVT prophylaxis: SCDs Code Status: Full code Family Communication: Patient is a resident of a group home.  She does not have any family.  Contacted her emergency contact, [name] who is the administrator at the facility.  She said that patient does not have any designated power of attorney. Disposition Plan: discharge home once renal and pulmonary status has stabilized   Consultants:   Nephrology  Procedures:   ETT  1/23> Temporary Dialysis line placement, left femoral under ultrasound guidance    Antimicrobials:   Levaquin 1/24>   Subjective: Episode of SVT aborted with IV adenosine  -Remains intubated and sedated  Objective: Vitals:   09/10/19 1715 09/10/19 1730 09/10/19 1745 09/10/19 1800  BP: 132/64 (!) 117/57 (!) 113/59 (!) 106/56  Pulse: 81 79 77 76  Resp: 20 (!) '6 15 20  '$ Temp:      TempSrc:      SpO2: 96% 97% 97% 97%  Weight:      Height:        Intake/Output Summary (Last 24 hours) at 09/10/2019 1839 Last data filed at 09/10/2019 1800 Gross per 24 hour  Intake 297.79 ml  Output 775 ml  Net -477.21 ml   Filed Weights   09/07/19 0507 09/08/19 0600 09/09/19 0500  Weight: 104.8 kg 104.7 kg 99.9 kg    Examination:  General exam: intubated on ventilator HEENT-ET tube and OG tube Respiratory system: diminished breath sounds on right, crackles at bases.  . Cardiovascular system:RRR. No murmurs, rubs, gallops. Gastrointestinal system: Abdomen is nondistended, soft and   Normal bowel sounds heard. Central nervous system: .  Intubated and sedated Extremities: 1-2+ edema bilaterally Skin: No rashes, lesions or ulcers Psychiatry: unable to assess since she is on a vent MSK-left femoral HD catheter   Data Reviewed: I have personally reviewed following labs and imaging studies  CBC: Recent Labs  Lab 09/06/19 0343 09/07/19 0532 09/08/19 1544 09/09/19 0424 09/10/19 0406  WBC 5.9 6.7 6.7 9.0 10.2  NEUTROABS 5.1  --   --   --   --   HGB 7.5* 7.2* 8.7* 7.0* 7.4*  HCT 22.9* 22.1* 26.3* 20.9* 22.6*  MCV 100.0 100.5* 100.4* 97.7 98.7  PLT 155 159 171 155 578*   Basic Metabolic Panel: Recent Labs  Lab  09/07/19 0532 09/08/19 0525 09/08/19 1544 09/09/19 0424 09/10/19 0406  NA 140 138 138 138 139  K 3.6 3.5 4.3 3.2* 3.7  CL 101 99 99 101 102  CO2 '27 28 27 25 23  '$ GLUCOSE 104* 110* 223* 100* 129*  BUN 64* 68* 70* 70* 69*  CREATININE 6.47* 6.26* 6.75* 6.42* 6.93*   CALCIUM 7.9* 8.0* 8.2* 8.1* 7.7*  MG  --   --   --   --  2.3  PHOS 6.9* 6.8*  --  6.4* 6.5*   GFR: Estimated Creatinine Clearance: 8.7 mL/min (A) (by C-G formula based on SCr of 6.93 mg/dL (H)). Liver Function Tests: Recent Labs  Lab 09/06/19 0343 09/07/19 0532 09/08/19 0525 09/09/19 0424 09/10/19 0406  AST 19  --   --   --   --   ALT 20  --   --   --   --   ALKPHOS 73  --   --   --   --   BILITOT 0.5  --   --   --   --   PROT 5.8*  --   --   --   --   ALBUMIN 2.6* 2.5* 2.5* 2.3* 2.0*   No results for input(s): LIPASE, AMYLASE in the last 168 hours. No results for input(s): AMMONIA in the last 168 hours. Coagulation Profile: Recent Labs  Lab 09/09/19 1224  INR 1.1   Cardiac Enzymes: No results for input(s): CKTOTAL, CKMB, CKMBINDEX, TROPONINI in the last 168 hours. BNP (last 3 results) No results for input(s): PROBNP in the last 8760 hours. HbA1C: No results for input(s): HGBA1C in the last 72 hours. CBG: Recent Labs  Lab 09/10/19 0341 09/10/19 0358 09/10/19 0749 09/10/19 1154 09/10/19 1618  GLUCAP 67* 149* 93 100* 101*   Lipid Profile: No results for input(s): CHOL, HDL, LDLCALC, TRIG, CHOLHDL, LDLDIRECT in the last 72 hours. Thyroid Function Tests: No results for input(s): TSH, T4TOTAL, FREET4, T3FREE, THYROIDAB in the last 72 hours. Anemia Panel: No results for input(s): VITAMINB12, FOLATE, FERRITIN, TIBC, IRON, RETICCTPCT in the last 72 hours. Sepsis Labs: No results for input(s): PROCALCITON, LATICACIDVEN in the last 168 hours.  Recent Results (from the past 240 hour(s))  SARS CORONAVIRUS 2 (TAT 6-24 HRS) Nasopharyngeal Nasopharyngeal Swab     Status: None   Collection Time: 09/06/19  3:43 AM   Specimen: Nasopharyngeal Swab  Result Value Ref Range Status   SARS Coronavirus 2 NEGATIVE NEGATIVE Final    Comment: (NOTE) SARS-CoV-2 target nucleic acids are NOT DETECTED. The SARS-CoV-2 RNA is generally detectable in upper and lower respiratory  specimens during the acute phase of infection. Negative results do not preclude SARS-CoV-2 infection, do not rule out co-infections with other pathogens, and should not be used as the sole basis for treatment or other patient management decisions. Negative results must be combined with clinical observations, patient history, and epidemiological information. The expected result is Negative. Fact Sheet for Patients: SugarRoll.be Fact Sheet for Healthcare Providers: https://www.woods-mathews.com/ This test is not yet approved or cleared by the [place] FDA and  has been authorized for detection and/or diagnosis of SARS-CoV-2 by FDA under an Emergency Use Authorization (EUA). This EUA will remain  in effect (meaning this test can be used) for the duration of the COVID-19 declaration under Section 56 4(b)(1) of the Act, 21 U.S.C. section 360bbb-3(b)(1), unless the authorization is terminated or revoked sooner. Performed at [facility], [place] [address], [place], [place] [address]  MRSA PCR Screening     Status: None   Collection Time: 09/08/19 12:30 PM   Specimen: Nasal Mucosa; Nasopharyngeal  Result Value Ref Range Status   MRSA by PCR NEGATIVE NEGATIVE Final    Comment:        The GeneXpert MRSA Assay (FDA approved for NASAL specimens only), is one component of a comprehensive MRSA colonization surveillance program. It is not intended to diagnose MRSA infection nor to guide or monitor treatment for MRSA infections. Performed at [facility], [address] [place], [place] [address]          Radiology Studies: CT ANGIO CHEST PE W OR WO CONTRAST  Result Date: 09/08/2019 CLINICAL DATA:  Severe hypoxia. EXAM: CT ANGIOGRAPHY CHEST WITH CONTRAST TECHNIQUE: Multidetector CT imaging of the chest was performed using the standard protocol during bolus administration of intravenous contrast. Multiplanar CT image reconstructions and  MIPs were obtained to evaluate the vascular anatomy. CONTRAST:  165m OMNIPAQUE IOHEXOL 300 MG/ML  SOLN COMPARISON:  Jan 12, 2018 FINDINGS: Cardiovascular: There is mild calcification of the aortic arch. Satisfactory opacification of the pulmonary arteries to the segmental level. No evidence of pulmonary embolism. There is mild, stable cardiomegaly. No pericardial effusion. Marked severity coronary artery calcification is seen. Mediastinum/Nodes: No enlarged mediastinal, hilar, or axillary lymph nodes. Thyroid gland, trachea, and esophagus demonstrate no significant findings. Lungs/Pleura: Endotracheal and nasogastric tubes are present. Moderate severity atelectasis and/or infiltrate is seen within the posterior aspect of the left upper lobe and left lower lobe with marked severity consolidation seen throughout the entire right lower lobe. Moderate size bilateral pleural effusions are seen, right greater than left. No pneumothorax is seen. Upper Abdomen: No acute abnormality. Musculoskeletal: Multilevel degenerative changes seen throughout the thoracic spine Review of the MIP images confirms the above findings. IMPRESSION: 1. No CT evidence of acute pulmonary embolism. 2. Moderate severity left upper lobe and right lower lobe atelectasis and/or infiltrate with marked severity consolidation seen throughout the entire right lower lobe. 3. Moderate size bilateral pleural effusions, right greater than left. Electronically Signed   By: TVirgina NorfolkM.D.   On: 09/08/2019 22:13   DG CHEST PORT 1 VIEW  Result Date: 09/10/2019 CLINICAL DATA:  Acute respiratory failure with hypoxia EXAM: PORTABLE CHEST 1 VIEW COMPARISON:  Yesterday FINDINGS: Small pleural effusions with superimposed lower lobe atelectasis by most recent chest CT. Cardiomegaly. Endotracheal tube tip is at the clavicular heads, slightly higher than before. The enteric tube at least reaches the stomach. IMPRESSION: Continued low volume chest with  layering effusions and lower lobe atelectasis. Electronically Signed   By: JMonte FantasiaM.D.   On: 09/10/2019 05:45   Portable Chest xray  Result Date: 09/09/2019 CLINICAL DATA:  Respiratory failure EXAM: PORTABLE CHEST 1 VIEW COMPARISON:  CTA chest dated 09/08/2019 FINDINGS: Endotracheal tube terminates 2.5 cm above the carina. Enteric tube courses into the stomach. Moderate layering right pleural effusion. Associated right lower lobe opacity, likely atelectasis. Small left pleural effusion. Pulmonary vascular congestion without frank interstitial edema. Cardiomegaly. IMPRESSION: Endotracheal tube terminates 2.5 cm above the carina. Cardiomegaly with pulmonary vascular congestion. No frank interstitial edema. Moderate right and small left pleural effusions. Associated right lower lobe opacity, likely atelectasis. Electronically Signed   By: SJulian HyM.D.   On: 09/09/2019 07:10        Scheduled Meds: . acetaminophen  325 mg Per NG tube BID  . aspirin  81 mg Per NG tube q morning - 10a  . atorvastatin  20 mg Per NG tube q1800  . calcitRIOL  0.25 mcg Per NG tube Q M,W,F  . chlorhexidine gluconate (MEDLINE KIT)  15 mL Mouth Rinse BID  . Chlorhexidine Gluconate Cloth  6 each Topical Daily  . Chlorhexidine Gluconate Cloth  6 each Topical Q0600  . cholecalciferol  4,000 Units Per NG tube q morning - 10a  . darbepoetin (ARANESP) injection - NON-DIALYSIS  150 mcg Subcutaneous Q Thu-1800  . furosemide  80 mg Intravenous Q12H  . heparin  5,000 Units Subcutaneous Q8H  . influenza vaccine adjuvanted  0.5 mL Intramuscular Tomorrow-1000  . insulin aspart  0-6 Units Subcutaneous Q4H  . insulin glargine  16 Units Subcutaneous QHS  . levothyroxine  25 mcg Per NG tube Q0600  . mouth rinse  15 mL Mouth Rinse 10 times per day  . metoprolol tartrate  12.5 mg Per Tube BID  . montelukast  10 mg Per Tube QHS  . multivitamin  15 mL Per Tube Daily  . neomycin-bacitracin-polymyxin  1 application  Topical Daily  . pantoprazole (PROTONIX) IV  40 mg Intravenous Daily  . primidone  50 mg Per NG tube q morning - 10a  . pyridOXINE  200 mg Per NG tube BID  . risperiDONE  0.5 mg Per NG tube BID  . sodium chloride flush  3 mL Intravenous Q12H  . traZODone  50 mg Per NG tube Daily   Continuous Infusions: . sodium chloride    . fentaNYL infusion INTRAVENOUS 100 mcg/hr (09/10/19 1659)  . [START ON 09/11/2019] levofloxacin (LEVAQUIN) IV       LOS: 4 days    Roxan Hockey, MD [facility]   If 7PM-7AM, please contact night-coverage [email or web address]  09/10/2019, 6:39 PM

## 2019-09-10 NOTE — Consult Note (Signed)
PULMONARY / CRITICAL CARE MEDICINE   NAME:  [name], MRN:  [id number], DOB:  June 16, 1951, LOS: 4 ADMISSION DATE:  09/06/2019, CONSULTATION DATE:  1/25 REFERRING MD:  [facility], CHIEF COMPLAINT:  resp failure   BRIEF HISTORY:     12 yowf remote smoker carries dx of COPD/AB on pred ? How long and saba admitted with progressive wt gain, leg swelling sob with w/u c/w anasarca / bilateral effusions go more sob 1/23 and required intubation / mech with CTa neg PE, positive for bilateral effusions with small lung voumes and PCCM asked so see 1/15    HISTORY OF PRESENT ILLNESS   No direct hx availabe but from H/P:  69 y.o. female   HTN, DM (on trulicity), hyperlipidemia, diastolic heart failure, schizoaffective D/O with episodes of catatonia and sz d/o- on primidone/cymbalta and risperdone. She also seems to have CKD- crt in the low to mid 2's in early 2020- then a crt in the system from October 2020 that was 3, followed by [name] as her neurologist who does not believe the patient has frank seizures --Presented to the ED with worsening shortness of breath, worsening lower extremity edema and decreased urine output despite reported  compliance with diuretics at home -- In the ED chest x-rays consistent with CHF -Creatinine admit  up to 6.34 creatinine was 3.1 on 06/11/2019 and creatinine was 2.1 on 10/09/2018 SIGNIFICANT PAST MEDICAL HISTORY    . Anxiety   . Asthma   . Back pain   . Barrett esophagus   . CHF (congestive heart failure) ([place])   . CKD (chronic kidney disease) stage 3, GFR 30-59 ml/min   . COPD (chronic obstructive pulmonary disease) ([place])   . Depression   . Diastolic heart failure ([place])   . Essential hypertension   . Fibromyalgia   . GERD (gastroesophageal reflux disease)   . Gout   . History of pericarditis   . Hyperlipidemia   . Hypothyroid   . Major depressive disorder   . Memory loss   . Nephrolithiasis    2008  . Obesity  hypoventilation syndrome ([facility])    CPAP  . Obstructive sleep apnea   . PE (pulmonary embolism)    2010  . Peripheral neuropathy   . PTSD (post-traumatic stress disorder)   . Rheumatoid arthritis ([place])   . Schizophrenia ([place])   . 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 Steroid dependence ([place])   . Type 2 diabetes mellitus ([place])   . Vitamin D deficiency    . APPENDECTOMY    . CHOLECYSTECTOMY    . COLONOSCOPY     benign polyps (12/2000), repeat colonoscopy performed in 2007  . ENDOMETRIAL BIOPSY     10/03 benign columnar mucosa (limited material)  . KIDNEY SURGERY       SIGNIFICANT EVENTS:  Respiratory  1/23 4pm resp distress on floor, rapid response > intubated and transferred to ICU  STUDIES:   CTa  1/23 1. No CT evidence of acute pulmonary embolism. 2. Moderate severity left upper lobe and right lower lobe atelectasis and/or infiltrate with marked severity consolidation seen throughout the entire right lower lobe. 3. Moderate size bilateral pleural effusions, right greater than left.  CULTURES:  Covid 1/21 neg MRSA pcr  1/23 neg   ANTIBIOTICS:  Levaquin 1/24 >>>  LINES/TUBES:  Oral ET  1/23 L Fem 1/25    CONSULTANTS:  Neprology 1/21  SUBJECTIVE:  Comfortable on vent/ ontly breathing at backup rate   CONSTITUTIONAL: BP  115/61   Pulse 86   Temp 99.6 F (37.6 C) (Axillary)   Resp (!) 9   Ht 5\' 2"  (1.575 m)   Wt 99.9 kg   SpO2 97%   BMI 40.28 kg/m   I/O last 3 completed shifts: In: 723.8 [I.V.:162.8; Blood:371; NG/GT:60; IV Piggyback:129.9] Out: 1850 [Urine:1850]     Vent Mode: PRVC FiO2 (%):  [40 %] 40 % Set Rate:  [20 bmp] 20 bmp Vt Set:  [400 mL] 400 mL PEEP:  [5 cmH20] 5 cmH20 Pressure Support:  [5 cmH20] 5 cmH20 Plateau Pressure:  [17 cmH20-19 cmH20] 17 cmH20  PHYSICAL EXAM: General:  Obese elderly wf nad  Neuro:  Appears alert, shuts eyes/ squeezes hand to request HEENT:  Oral et  Cardiovascular:  RRR no  s3, distant S1S2  1+ pitting both LEs Lungs:  Distant bs s wheeze Abdomen:  Obese/soft/ benign Musculoskeletal:  No deformities, calf tenderness  Skin:  Mild chronic venous stasis changes both LE's    I personally reviewed images and agree with radiology impression as follows:  pCXR:    1/25  Continued low volume chest with layering effusions and lower lobe atelectasis.  RESOLVED PROBLEM LIST   ASSESSMENT AND PLAN    1) Acute resp failure hypoxemic and hypercarbic present on admit  in setting of vol overload, acute renal failure and anasarca related to low albumin  - well compensated on present vent settings s air trapping but poor Clung due to body habitus and pleural effusions / no evidence of airflow obstruction despite previous use of saba and prednisone ? Why?  >>> leave on vent until achieve improved vol overload  NB : Note that pleural effusiond and copd have the same effect on insp muscles/mechanics (both shorten their length prior to inspiration making them weaker with less force reserve) so they are synergistic in causing sob  - no evidence of pulmonary infection but I have no problem with empirically rx with levquin in this setting.  2) Acute renal failure/ low albumin > bilateral pleural effusions. >>> agree with plans for HD then can address vent weaning   3) Acute metabolic encephalopathy secondary to hypercarbia, present on admit.   4) Normocytic anemia typical of renal failure > rx wit hTriad / renal when HD starts up    Lab Results  Component Value Date   HGB 7.4 (L) 09/10/2019   HGB 7.0 (L) 09/09/2019   HGB 8.7 (L) 09/08/2019      SUMMARY OF TODAY'S PLAN:  Remain on vent until pull fluid off per HD   Best Practice / Goals of Care / Disposition.   DVT PROPHYLAXIS:  Sub Q je[ SUP: PPI NUTRITION:per [facility]  MOBILITY:as tol  GOALS OF CARE: per [facility]  FAMILY DISCUSSIONS: per [facility]  DISPOSITION remain ICU/ on Vent   LABS  Glucose Recent Labs  Lab  09/09/19 2019 09/09/19 2358 09/10/19 0341 09/10/19 0358 09/10/19 0749 09/10/19 1154  GLUCAP 85 77 67* 149* 93 100*    BMET Recent Labs  Lab 09/08/19 1544 09/09/19 0424 09/10/19 0406  NA 138 138 139  K 4.3 3.2* 3.7  CL 99 101 102  CO2 27 25 23   BUN 70* 70* 69*  CREATININE 6.75* 6.42* 6.93*  GLUCOSE 223* 100* 129*    Liver Enzymes Recent Labs  Lab 09/06/19 0343 09/07/19 0532 09/08/19 0525 09/09/19 0424 09/10/19 0406  AST 19  --   --   --   --   ALT 20  --   --   --   --  ALKPHOS 73  --   --   --   --   BILITOT 0.5  --   --   --   --   ALBUMIN 2.6*   < > 2.5* 2.3* 2.0*   < > = values in this interval not displayed.    Electrolytes Recent Labs  Lab 09/08/19 0525 09/08/19 0525 09/08/19 1544 09/09/19 0424 09/10/19 0406  CALCIUM 8.0*   < > 8.2* 8.1* 7.7*  MG  --   --   --   --  2.3  PHOS 6.8*  --   --  6.4* 6.5*   < > = values in this interval not displayed.    CBC Recent Labs  Lab 09/08/19 1544 09/09/19 0424 09/10/19 0406  WBC 6.7 9.0 10.2  HGB 8.7* 7.0* 7.4*  HCT 26.3* 20.9* 22.6*  PLT 171 155 126*    ABG Recent Labs  Lab 09/08/19 1755 09/09/19 0424 09/10/19 0409  PHART 7.363 7.420 7.412  PCO2ART 46.7 40.8 41.0  PO2ART 77.8* 65.6* 66.0*    Coag's Recent Labs  Lab 09/09/19 1224  INR 1.1    Sepsis Markers No results for input(s): LATICACIDVEN, PROCALCITON, O2SATVEN in the last 168 hours.  Cardiac Enzymes No results for input(s): TROPONINI, PROBNP in the last 168 hours.  PAST MEDICAL HISTORY :   She  has a past medical history of Anxiety, Asthma, Back pain, Barrett esophagus, CHF (congestive heart failure) ([place]), CKD (chronic kidney disease) stage 3, GFR 30-59 ml/min, COPD (chronic obstructive pulmonary disease) ([place]), Depression, Diastolic heart failure ([place]), Essential hypertension, Fibromyalgia, GERD (gastroesophageal reflux disease), Gout, History of pericarditis, Hyperlipidemia, Hypothyroid, Major depressive disorder, Memory  loss, Nephrolithiasis, Obesity hypoventilation syndrome ([place]), Obstructive sleep apnea, PE (pulmonary embolism), Peripheral neuropathy, PTSD (post-traumatic stress disorder), Rheumatoid arthritis ([place]), Schizophrenia ([place]), Secondary Parkinson disease ([place]) (10/19/2018), Steroid dependence ([place]), Type 2 diabetes mellitus ([place]), and Vitamin D deficiency.  PAST SURGICAL HISTORY:  She  has a past surgical history that includes Colonoscopy; Endometrial biopsy; Appendectomy; Cholecystectomy; and Kidney surgery.  Allergies  Allergen Reactions  . Benztropine Mesylate Other (See Comments)    Alopecia and white spots on eyes  . Codeine Swelling, Palpitations and Other (See Comments)    Mouth Swelling and Tachycardia   . Diazepam Other (See Comments)    COMA  . Diphenhydramine Hcl Anxiety, Palpitations and Other (See Comments)    Tachycardia and anxiousness  . Penicillins Swelling and Other (See Comments)    REACTION: ulcers in mouth, swelling, throat swelling  Has patient had a PCN reaction causing immediate rash, facial/tongue/throat swelling, SOB or lightheadedness with hypotension: YES Has patient had a PCN reaction causing severe rash involving Mucus membranes or skin necrosis: Unknown Has patient had a PCN reaction that required hospitalization Unknown Has patient had a PCN reaction occurring within the last 10 years: unknown If all of the above answers are "NO", then may proceed with Cephalospori  . Sulfonamide Derivatives Rash    Blisters, also  . Thioridazine Hcl Swelling  . Lisinopril Cough    No current facility-administered medications on file prior to encounter.   Current Outpatient Medications on File Prior to Encounter  Medication Sig  . acetaminophen (TYLENOL) 325 MG tablet Take 325 mg by mouth 2 (two) times daily.   [name] Kitchen albuterol (PROVENTIL) (2.5 MG/3ML) 0.083% nebulizer solution Take 2.5 mg by nebulization every 6 (six) hours as needed for wheezing or shortness of breath.  [name] Kitchen  albuterol (VENTOLIN HFA) 108 (90 Base) MCG/ACT inhaler Inhale 1  puff into the lungs every 6 (six) hours as needed for wheezing or shortness of breath.  [name] Kitchen amLODipine (NORVASC) 5 MG tablet Take 1 tablet (5 mg total) by mouth daily. (Patient taking differently: Take 5 mg by mouth every morning. )  . aspirin 81 MG tablet Take 81 mg by mouth every morning.   [name] Kitchen atorvastatin (LIPITOR) 20 MG tablet Take 1 tablet by mouth at bedtime.   . calcium carbonate (TUMS - DOSED IN MG ELEMENTAL CALCIUM) 500 MG chewable tablet Chew 1 tablet by mouth daily as needed for indigestion or heartburn.  . carvedilol (COREG) 6.25 MG tablet Take 1 tablet (6.25 mg total) by mouth 2 (two) times daily with a meal.  . Cholecalciferol (VITAMIN D3) 50 MCG (2000 UT) TABS Take 4,000 Units by mouth every morning.   [name] Kitchen Dextromethorphan-guaiFENesin 10-100 MG/5ML liquid Take 5 mLs by mouth daily as needed (cough).  . docusate sodium (COLACE) 100 MG capsule Take 1 capsule (100 mg total) by mouth 2 (two) times daily. Hold for diarrhea (Patient taking differently: Take 100 mg by mouth daily as needed. Hold for diarrhea)  . Dulaglutide (TRULICITY) 1.5 0000000 SOPN Inject 1.5 mg into the skin every Friday.   . DULoxetine (CYMBALTA) 60 MG capsule Take 1 capsule by mouth daily.  [name] Kitchen EPINEPHrine (EPI-PEN) 0.3 mg/0.3 mL DEVI Inject 0.3 mg into the muscle once as needed (for bee stings, then go to the ED immediately after using).   . gabapentin (NEURONTIN) 300 MG capsule Take 1 capsule (300 mg total) by mouth 2 (two) times daily. (Patient taking differently: Take 300 mg by mouth 2 (two) times daily. & 600 mg at bedtime)  . hydrOXYzine (ATARAX/VISTARIL) 25 MG tablet Take 25 mg by mouth 2 (two) times daily as needed for anxiety.   . insulin degludec (TRESIBA FLEXTOUCH) 100 UNIT/ML SOPN FlexTouch Pen Inject 18 Units into the skin at bedtime.   [name] Kitchen levofloxacin (LEVAQUIN) 500 MG tablet Take 500 mg by mouth daily.  [name] Kitchen levothyroxine (SYNTHROID, LEVOTHROID) 25  MCG tablet Take 1 tablet (25 mcg total) by mouth daily at 6 (six) AM.  . linagliptin (TRADJENTA) 5 MG TABS tablet Take 5 mg by mouth every morning.   [name] Kitchen LORazepam (ATIVAN) 1 MG tablet Take 1 mg by mouth daily as needed for anxiety.  . montelukast (SINGULAIR) 10 MG tablet Take 10 mg by mouth at bedtime.  . Multiple Vitamin (DAILY VITE PO) Take 1 tablet by mouth every morning.   [name] Kitchen omeprazole (PRILOSEC) 20 MG capsule Take 20 mg by mouth every morning.   . prazosin (MINIPRESS) 2 MG capsule Take 1 capsule by mouth at bedtime.  . predniSONE (DELTASONE) 10 MG tablet Take 10 mg by mouth at bedtime.  . primidone (MYSOLINE) 50 MG tablet Take 50 mg by mouth every morning.  . pyridOXINE (VITAMIN B-6) 100 MG tablet Take 200 mg by mouth 2 (two) times daily.  . risperiDONE (RISPERDAL) 0.5 MG tablet Take 1 tablet by mouth 2 (two) times daily.  . sodium phosphate (FLEET) 7-19 GM/118ML ENEM Place 1 enema rectally daily as needed for mild constipation or severe constipation.  . torsemide (DEMADEX) 20 MG tablet TAKE 3 TABLETS (60mg ) BY MOUTH EVERY OTHER DAY; ALTERNATING WITH 2 TABLETS (40mg ) BY MOUTH EVERY OTHER DAY.  . traMADol (ULTRAM) 50 MG tablet Take 1 tablet by mouth every 6 (six) hours as needed for moderate pain.   . traZODone (DESYREL) 50 MG tablet Take 1 tablet by mouth daily.  . Vitamins A &  D (VITAMIN A & D) ointment Apply 1 application topically See admin instructions. To be applied after baths in the morning due to rash under skin folds    FAMILY HISTORY:   Her family history includes Bone cancer in her mother; COPD in her father; Heart disease in her father and mother; Hypertension in her mother; Stroke in her father.  SOCIAL HISTORY:  She  reports that she quit smoking about 47 years ago. Her smoking use included cigarettes. She has never used smokeless tobacco. She reports that she does not drink alcohol or use drugs.    The patient is critically ill with multiple organ systems failure and  requires high complexity decision making for assessment and support, frequent evaluation and titration of therapies, application of advanced monitoring technologies and extensive interpretation of multiple databases. Critical Care Time devoted to patient care services described in this note is 45 minutes.    The patient is critically ill with multiple organ systems failure and requires high complexity decision making for assessment and support, frequent evaluation and titration of therapies, application of advanced monitoring technologies and extensive interpretation of multiple databases. Critical Care Time devoted to patient care services described in this note is 45 minutes.    Christinia Gully, MD Pulmonary and [place] [phone] After 5:30 PM or weekends, use [facility] [phone]

## 2019-09-11 ENCOUNTER — Inpatient Hospital Stay (HOSPITAL_COMMUNITY): Payer: Medicare Other

## 2019-09-11 DIAGNOSIS — E8779 Other fluid overload: Secondary | ICD-10-CM | POA: Diagnosis not present

## 2019-09-11 DIAGNOSIS — J9601 Acute respiratory failure with hypoxia: Secondary | ICD-10-CM

## 2019-09-11 DIAGNOSIS — J9602 Acute respiratory failure with hypercapnia: Secondary | ICD-10-CM

## 2019-09-11 DIAGNOSIS — N189 Chronic kidney disease, unspecified: Secondary | ICD-10-CM | POA: Diagnosis not present

## 2019-09-11 DIAGNOSIS — G9341 Metabolic encephalopathy: Secondary | ICD-10-CM | POA: Diagnosis not present

## 2019-09-11 DIAGNOSIS — D649 Anemia, unspecified: Secondary | ICD-10-CM | POA: Diagnosis not present

## 2019-09-11 DIAGNOSIS — N179 Acute kidney failure, unspecified: Secondary | ICD-10-CM | POA: Diagnosis not present

## 2019-09-11 DIAGNOSIS — R0602 Shortness of breath: Secondary | ICD-10-CM | POA: Diagnosis not present

## 2019-09-11 LAB — RENAL FUNCTION PANEL
Albumin: 2 g/dL — ABNORMAL LOW (ref 3.5–5.0)
Anion gap: 13 (ref 5–15)
BUN: 73 mg/dL — ABNORMAL HIGH (ref 8–23)
CO2: 23 mmol/L (ref 22–32)
Calcium: 7.7 mg/dL — ABNORMAL LOW (ref 8.9–10.3)
Chloride: 105 mmol/L (ref 98–111)
Creatinine, Ser: 7.5 mg/dL — ABNORMAL HIGH (ref 0.44–1.00)
GFR calc Af Amer: 6 mL/min — ABNORMAL LOW (ref >60)
GFR calc non Af Amer: 5 mL/min — ABNORMAL LOW (ref >60)
Glucose, Bld: 73 mg/dL (ref 70–99)
Phosphorus: 7.5 mg/dL — ABNORMAL HIGH (ref 2.5–4.6)
Potassium: 3.6 mmol/L (ref 3.5–5.1)
Sodium: 141 mmol/L (ref 135–145)

## 2019-09-11 LAB — GLUCOSE, CAPILLARY
Glucose-Capillary: 100 mg/dL — ABNORMAL HIGH (ref 70–99)
Glucose-Capillary: 132 mg/dL — ABNORMAL HIGH (ref 70–99)
Glucose-Capillary: 76 mg/dL (ref 70–99)
Glucose-Capillary: 93 mg/dL (ref 70–99)
Glucose-Capillary: 93 mg/dL (ref 70–99)
Glucose-Capillary: 94 mg/dL (ref 70–99)

## 2019-09-11 MED ORDER — HEPARIN SODIUM (PORCINE) 1000 UNIT/ML DIALYSIS: 1000.0000 [IU] | INTRAMUSCULAR | Status: DC | PRN

## 2019-09-11 MED ORDER — VITAL AF 1.2 CAL PO LIQD
1000.0000 mL | ORAL | Status: DC
  Administered 2019-09-11: 1000 mL

## 2019-09-11 MED ORDER — ALTEPLASE 2 MG IJ SOLR
2.0000 mg | Freq: Once | INTRAMUSCULAR | Status: AC | PRN
  Administered 2019-09-19: 2 mg
  Filled 2019-09-11 (×2): qty 2

## 2019-09-11 MED ORDER — PRO-STAT SUGAR FREE PO LIQD
30.0000 mL | Freq: Two times a day (BID) | ORAL | Status: DC
  Filled 2019-09-11: qty 30

## 2019-09-11 MED ORDER — PRO-STAT SUGAR FREE PO LIQD
30.0000 mL | Freq: Two times a day (BID) | ORAL | Status: DC
  Administered 2019-09-11 – 2019-09-17 (×9): 30 mL
  Filled 2019-09-11 (×9): qty 30

## 2019-09-11 MED ORDER — SODIUM CHLORIDE 0.9 % IV SOLN: 100.0000 mL | INTRAVENOUS | Status: DC | PRN

## 2019-09-11 MED ORDER — DEXTROSE 50 % IV SOLN
INTRAVENOUS | Status: AC
  Administered 2019-09-11: 50 mL
  Filled 2019-09-11: qty 50

## 2019-09-11 NOTE — Procedures (Signed)
     INITIAL HEMODIALYSIS TREATMENT NOTE:  First ever dialysis treatment performed 09/11/19 via left femoral non-tunneled catheter.  Goal met: 1 liter removed without interruption in UF.  All blood was returned.  Rockwell Alexandria, RN

## 2019-09-11 NOTE — Progress Notes (Signed)
PROGRESS NOTE    [name]  ZOX:[id number] DOB: Jan 07, 1951 DOA: 09/06/2019 PCP: Glenda Chroman, MD    Brief Narrative:  69 year old female with history of hypertension, diabetes, diastolic heart failure, schizoaffective disorder, chronic kidney disease stage IV, who presented to the emergency room with acute kidney injury on chronic kidney disease and creatinine of 6.3.  She was noted to be volume overload.  She is admitted to the hospital, started on intravenous Lasix and is being followed by nephrology.  She has not had any significant improvement in renal function and is felt that she will likely need hemodialysis.  Plans were to start hemodialysis on 1/25.  On 1/23, patient had a sudden change in mental status.  She had increased respiratory effort and blood gas showed respiratory acidosis.  Patient was transferred to the ICU and was intubated.  Further work-up for mental status change including CT head has been ordered. -Remains intubated and sedated   Assessment & Plan:   Principal Problem:   Acute kidney injury superimposed on chronic kidney disease (HCC) Active Problems:   Volume overload   SVT (supraventricular tachycardia) (HCC)   Type 2 diabetes mellitus with diabetic polyneuropathy, without long-term current use of insulin (HCC)   Normocytic anemia   Schizophrenia (HCC)   Depression   Essential hypertension   Chronic diastolic heart failure (HCC)   CKD (chronic kidney disease), stage IV (HCC)   Obesity, Class III, BMI 40-49.9 (morbid obesity) ([facility])   Type 2 diabetes mellitus (HCC)   Hypothyroidism   On mechanically assisted ventilation (HCC)   Acute metabolic encephalopathy   Acute respiratory failure with hypoxia and hypercapnia (HCC)    1)Acute respiratory failure with hypoxia and hypercapnia--multifactorial etiology including volume overload in the setting of ESRD-Intubated 09/08/2019  -PRVC mode/tidal volume 400/PEEP 5/FiO2 40%/RR 20 -PCCM consult appreciated  -Weaned off fentanyl drip, okay to use as needed IV Versed -Keep intubated until volume status improves with HD -Continue Levaquin -CTA chest without PE, has bilateral pleural effusions  2)AKI----acute kidney injury on CKD stage -IV Versus Progression towards CKD 5,GFR is now less than 10, -had uremic symptoms prior to intubation --Creatinine is up to 7.5, creatinine was 3.1 on 06/11/2019 and creatinine was 2.1 on 10/09/2018 -renal ultrasound without obstructive uropathy there is evidence of bilateral medical renal disease -Nephrology consult appreciated - patient was agreeable to undergo hemodialysis prior to intubation -Stop PTA torsemide, Failed on high-dose IV Lasix -renally adjust medications, avoid nephrotoxic agents / dehydration / hypotension -PTH -263 -Plans for first hemodialysis session on 09/11/2019  3) PSVT----had episode of paroxysmal SVT on 09/10/2019 responded well to IV adenosine, continue metoprolol 12.5 mg twice daily  4)HFpEF--last known EF 60 to 65% based on echo from January 2020, patient had diastolic dysfunction -Failed IV Lasix, metoprolol as ordered  5)HTN-stage II--amlodipine discontinued due to edema and to allow room to titrate diuretics, discontinue prazosin,   6)DM2with peripheral neuropathy--A1c is 5.2 reflecting excellent diabetic control PTA -PTA patient was on Tradjenta,Trulicity discontinued, and Lantus insulin  -Allow some permissive Hyperglycemia rather than risk life-threatening hypoglycemia in a patient with unreliable oral intake. Use Novolog/Humalog Sliding scale insulin with Accu-Cheks/Fingersticks as ordered  7)Hypothyroidism--continue levothyroxine 25 mcg daily,TSH was 2.9 in April 2020,  8))Neuropsychiatric disorders---underlying schizoaffective disorder, as well as questionable seizure attacks in the past, followed by [name] as her neurologist,who does not believe the patient has genuine seizures---PTA was on  Lamictal for mood stabilization, trazodone 50 mg nightly,,Risperdal 0.5 mg twice daily, vitamin  B6, Mysoline 50 mg daily, and as needed lorazepam  7)Morbid obesity/OSA---PTA was on CPAP nightly , currently intubated  8)Anemia of CKD---ESA/EPO agent per nephrology team, ferritin 19 and TIBC 241 and serum iron is 62, check stool occult blood  9)FEN--tube feeding initiated with water flushes through OG tube, Accu-Cheks as ordered  Disposition/Need for in-Hospital Stay- -currently intubated for hypoxic and hypercapnic respiratory failure -Will  need hemodialysis initiation and clipping process prior to discharge  Code Status : Full  Family Communication:   na   Consults  :  Neprhology/general surgery/PCCM  DVT Prophylaxis  :   - Heparin - SCDs CRITICAL CARE Performed by: Roxan Hockey   Total critical care time: 47 minutes  Critical care time was exclusive of separately billable procedures and treating other patients.  Critical care was necessary to treat or prevent imminent or life-threatening deterioration. -PRVC mode/tidal volume 400/PEEP 5/FiO2 40%/RR 20  Critical care was time spent personally by me on the following activities: development of treatment plan with patient and/or surrogate as well as nursing, discussions with consultants, evaluation of patient's response to treatment, examination of patient, obtaining history from patient or surrogate, ordering and performing treatments and interventions, ordering and review of laboratory studies, ordering and review of radiographic studies, pulse oximetry and re-evaluation of patient's condition.   DVT prophylaxis: SCDs Code Status: Full code Family Communication: Patient is a resident of a group home.  She does not have any family.  Contacted her emergency contact, [name] who is the administrator at the facility.  She said that patient does not have any designated power of attorney. Disposition Plan:  discharge home once renal and pulmonary status has stabilized   Consultants:   Nephrology  Procedures:   ETT 1/23> Temporary Dialysis line placement, left femoral under ultrasound guidance    Antimicrobials:   Levaquin 1/24>   Subjective: -Weaned off IV fentanyl drip remains lethargic CT head unremarkable,   Objective: Vitals:   09/11/19 1600 09/11/19 1630 09/11/19 1700 09/11/19 1730  BP: (!) 130/56 (!) 126/57 (!) 120/56 (!) 116/55  Pulse: 84 81 77 76  Resp: '20 15 18 18  '$ Temp:      TempSrc:      SpO2: 96% 99% 98% 98%  Weight:      Height:        Intake/Output Summary (Last 24 hours) at 09/11/2019 1848 Last data filed at 09/11/2019 1826 Gross per 24 hour  Intake 558.09 ml  Output 825 ml  Net -266.91 ml   Filed Weights   09/07/19 0507 09/08/19 0600 09/09/19 0500  Weight: 104.8 kg 104.7 kg 99.9 kg    Examination:  General exam: intubated on ventilator HEENT-ET tube and OG tube Respiratory system: diminished breath sounds on right, crackles at bases.  . Cardiovascular system:RRR. No murmurs, rubs, gallops. Gastrointestinal system: Abdomen is nondistended, soft and   Normal bowel sounds heard. Central nervous system: .  Intubated and sedated Extremities: 1-2+ edema bilaterally Skin: No rashes, lesions or ulcers Psychiatry: unable to assess since she is on a vent MSK-left femoral HD catheter   Data Reviewed: I have personally reviewed following labs and imaging studies  CBC: Recent Labs  Lab 09/06/19 0343 09/07/19 0532 09/08/19 1544 09/09/19 0424 09/10/19 0406  WBC 5.9 6.7 6.7 9.0 10.2  NEUTROABS 5.1  --   --   --   --   HGB 7.5* 7.2* 8.7* 7.0* 7.4*  HCT 22.9* 22.1* 26.3* 20.9* 22.6*  MCV 100.0 100.5* 100.4* 97.7 98.7  PLT 155 159 171 155 465*   Basic Metabolic Panel: Recent Labs  Lab 09/07/19 0532 09/07/19 0532 09/08/19 0525 09/08/19 1544 09/09/19 0424 09/10/19 0406 09/11/19 0412  NA 140   < > 138 138 138 139 141  K 3.6   < > 3.5 4.3  3.2* 3.7 3.6  CL 101   < > 99 99 101 102 105  CO2 27   < > '28 27 25 23 23  '$ GLUCOSE 104*   < > 110* 223* 100* 129* 73  BUN 64*   < > 68* 70* 70* 69* 73*  CREATININE 6.47*   < > 6.26* 6.75* 6.42* 6.93* 7.50*  CALCIUM 7.9*   < > 8.0* 8.2* 8.1* 7.7* 7.7*  MG  --   --   --   --   --  2.3  --   PHOS 6.9*  --  6.8*  --  6.4* 6.5* 7.5*   < > = values in this interval not displayed.   GFR: Estimated Creatinine Clearance: 8 mL/min (A) (by C-G formula based on SCr of 7.5 mg/dL (H)). Liver Function Tests: Recent Labs  Lab 09/06/19 0343 09/06/19 0343 09/07/19 0532 09/08/19 0525 09/09/19 0424 09/10/19 0406 09/11/19 0412  AST 19  --   --   --   --   --   --   ALT 20  --   --   --   --   --   --   ALKPHOS 73  --   --   --   --   --   --   BILITOT 0.5  --   --   --   --   --   --   PROT 5.8*  --   --   --   --   --   --   ALBUMIN 2.6*   < > 2.5* 2.5* 2.3* 2.0* 2.0*   < > = values in this interval not displayed.   No results for input(s): LIPASE, AMYLASE in the last 168 hours. No results for input(s): AMMONIA in the last 168 hours. Coagulation Profile: Recent Labs  Lab 09/09/19 1224  INR 1.1   Cardiac Enzymes: No results for input(s): CKTOTAL, CKMB, CKMBINDEX, TROPONINI in the last 168 hours. BNP (last 3 results) No results for input(s): PROBNP in the last 8760 hours. HbA1C: No results for input(s): HGBA1C in the last 72 hours. CBG: Recent Labs  Lab 09/11/19 0008 09/11/19 0418 09/11/19 0805 09/11/19 1101 09/11/19 1553  GLUCAP 94 76 93 93 100*   Lipid Profile: No results for input(s): CHOL, HDL, LDLCALC, TRIG, CHOLHDL, LDLDIRECT in the last 72 hours. Thyroid Function Tests: No results for input(s): TSH, T4TOTAL, FREET4, T3FREE, THYROIDAB in the last 72 hours. Anemia Panel: No results for input(s): VITAMINB12, FOLATE, FERRITIN, TIBC, IRON, RETICCTPCT in the last 72 hours. Sepsis Labs: No results for input(s): PROCALCITON, LATICACIDVEN in the last 168 hours.  Recent Results  (from the past 240 hour(s))  SARS CORONAVIRUS 2 (TAT 6-24 HRS) Nasopharyngeal Nasopharyngeal Swab     Status: None   Collection Time: 09/06/19  3:43 AM   Specimen: Nasopharyngeal Swab  Result Value Ref Range Status   SARS Coronavirus 2 NEGATIVE NEGATIVE Final    Comment: (NOTE) SARS-CoV-2 target nucleic acids are NOT DETECTED. The SARS-CoV-2 RNA is generally detectable in upper and lower respiratory specimens during the acute phase of infection. Negative results do not preclude SARS-CoV-2 infection, do not rule out co-infections with other pathogens,  and should not be used as the sole basis for treatment or other patient management decisions. Negative results must be combined with clinical observations, patient history, and epidemiological information. The expected result is Negative. Fact Sheet for Patients: SugarRoll.be Fact Sheet for Healthcare Providers: https://www.woods-mathews.com/ This test is not yet approved or cleared by the [place] FDA and  has been authorized for detection and/or diagnosis of SARS-CoV-2 by FDA under an Emergency Use Authorization (EUA). This EUA will remain  in effect (meaning this test can be used) for the duration of the COVID-19 declaration under Section 56 4(b)(1) of the Act, 21 U.S.C. section 360bbb-3(b)(1), unless the authorization is terminated or revoked sooner. Performed at [facility], [place] [address] [place], [place] [address]   MRSA PCR Screening     Status: None   Collection Time: 09/08/19 12:30 PM   Specimen: Nasal Mucosa; Nasopharyngeal  Result Value Ref Range Status   MRSA by PCR NEGATIVE NEGATIVE Final    Comment:        The GeneXpert MRSA Assay (FDA approved for NASAL specimens only), is one component of a comprehensive MRSA colonization surveillance program. It is not intended to diagnose MRSA infection nor to guide or monitor treatment for MRSA infections. Performed at  [facility], [address], [place], [place] [address]          Radiology Studies: CT HEAD WO CONTRAST  Result Date: 09/11/2019 CLINICAL DATA:  Onset left-side neglect today. EXAM: CT HEAD WITHOUT CONTRAST TECHNIQUE: Contiguous axial images were obtained from the base of the skull through the vertex without intravenous contrast. COMPARISON:  Head CT 09/08/2019.  Brain MRI 09/27/2018. FINDINGS: Brain: No evidence of acute infarction, hemorrhage, hydrocephalus, extra-axial collection or mass lesion/mass effect. Vascular: No hyperdense vessel.  Atherosclerosis noted. Skull: Intact.  No focal lesion. Sinuses/Orbits: Small right mastoid effusion is seen. Mucous retention cyst or polyp left maxillary sinus noted. Mild mucosal thickening scattered ethmoid air cells, sphenoid sinuses and right maxillary sinus also noted. Other: None. IMPRESSION: No acute abnormality finding to explain the patient's symptoms. Atherosclerosis. Mild sinus disease. Electronically Signed   By: Inge Rise M.D.   On: 09/11/2019 15:41   DG CHEST PORT 1 VIEW  Result Date: 09/10/2019 CLINICAL DATA:  Acute respiratory failure with hypoxia EXAM: PORTABLE CHEST 1 VIEW COMPARISON:  Yesterday FINDINGS: Small pleural effusions with superimposed lower lobe atelectasis by most recent chest CT. Cardiomegaly. Endotracheal tube tip is at the clavicular heads, slightly higher than before. The enteric tube at least reaches the stomach. IMPRESSION: Continued low volume chest with layering effusions and lower lobe atelectasis. Electronically Signed   By: Monte Fantasia M.D.   On: 09/10/2019 05:45        Scheduled Meds: . acetaminophen  325 mg Per NG tube BID  . aspirin  81 mg Per NG tube q morning - 10a  . atorvastatin  20 mg Per NG tube q1800  . calcitRIOL  0.25 mcg Per NG tube Q M,W,F  . chlorhexidine gluconate (MEDLINE KIT)  15 mL Mouth Rinse BID  . Chlorhexidine Gluconate Cloth  6 each Topical Daily  . Chlorhexidine  Gluconate Cloth  6 each Topical Q0600  . cholecalciferol  4,000 Units Per NG tube q morning - 10a  . darbepoetin (ARANESP) injection - NON-DIALYSIS  150 mcg Subcutaneous Q Thu-1800  . feeding supplement (PRO-STAT SUGAR FREE 64)  30 mL Per Tube BID  . furosemide  80 mg Intravenous Q12H  . heparin  5,000  Units Subcutaneous Q8H  . influenza vaccine adjuvanted  0.5 mL Intramuscular Tomorrow-1000  . insulin aspart  0-6 Units Subcutaneous Q4H  . insulin glargine  16 Units Subcutaneous QHS  . levothyroxine  25 mcg Per NG tube Q0600  . mouth rinse  15 mL Mouth Rinse 10 times per day  . metoprolol tartrate  12.5 mg Per Tube BID  . montelukast  10 mg Per Tube QHS  . multivitamin  15 mL Per Tube Daily  . neomycin-bacitracin-polymyxin  1 application Topical Daily  . pantoprazole (PROTONIX) IV  40 mg Intravenous Daily  . primidone  50 mg Per NG tube q morning - 10a  . pyridOXINE  200 mg Per NG tube BID  . risperiDONE  0.5 mg Per NG tube BID  . sodium chloride flush  3 mL Intravenous Q12H  . traZODone  50 mg Per NG tube Daily   Continuous Infusions: . sodium chloride    . feeding supplement (VITAL AF 1.2 CAL) 1,000 mL (09/11/19 1227)  . fentaNYL infusion INTRAVENOUS Stopped (09/11/19 1448)  . levofloxacin (LEVAQUIN) IV Stopped (09/11/19 1729)     LOS: 5 days   Roxan Hockey, MD [facility]  If 7PM-7AM, please contact night-coverage [email or web address]  09/11/2019, 6:48 PM

## 2019-09-11 NOTE — Progress Notes (Signed)
Patient ID: [name], female   DOB: 28-Feb-1951, 69 y.o.   MRN: [id number] S: Had issues with tachycardia and hypotension yesterday but responded to cardizem and metoprolol. O:BP (!) 111/52   Pulse 75   Temp 99.7 F (37.6 C) (Axillary)   Resp 20   Ht '5\' 2"'$  (1.575 m)   Wt 99.9 kg   SpO2 97%   BMI 40.28 kg/m   Intake/Output Summary (Last 24 hours) at 09/11/2019 0858 Last data filed at 09/11/2019 0616 Gross per 24 hour  Intake 344.85 ml  Output 800 ml  Net -455.15 ml   Intake/Output: I/O last 3 completed shifts: In: 570.7 [I.V.:390.7; NG/GT:180] Out: 1200 [Urine:1100; Emesis/NG output:100]  Intake/Output this shift:  No intake/output data recorded. Weight change:  Gen: intubated, obese WF CVS: no rub Resp:occ rhonchi bilaterally Abd: +BS, soft, NT Ext: 1+ anasarca, left femoral HD catheter in place  Recent Labs  Lab 09/06/19 0343 09/07/19 0532 09/08/19 0525 09/08/19 1544 09/09/19 0424 09/10/19 0406 09/11/19 0412  NA 142 140 138 138 138 139 141  K 4.0 3.6 3.5 4.3 3.2* 3.7 3.6  CL 104 101 99 99 101 102 105  CO2 '26 27 28 27 25 23 23  '$ GLUCOSE 148* 104* 110* 223* 100* 129* 73  BUN 62* 64* 68* 70* 70* 69* 73*  CREATININE 6.34* 6.47* 6.26* 6.75* 6.42* 6.93* 7.50*  ALBUMIN 2.6* 2.5* 2.5*  --  2.3* 2.0* 2.0*  CALCIUM 8.2* 7.9* 8.0* 8.2* 8.1* 7.7* 7.7*  PHOS  --  6.9* 6.8*  --  6.4* 6.5* 7.5*  AST 19  --   --   --   --   --   --   ALT 20  --   --   --   --   --   --    Liver Function Tests: Recent Labs  Lab 09/06/19 0343 09/07/19 0532 09/09/19 0424 09/10/19 0406 09/11/19 0412  AST 19  --   --   --   --   ALT 20  --   --   --   --   ALKPHOS 73  --   --   --   --   BILITOT 0.5  --   --   --   --   PROT 5.8*  --   --   --   --   ALBUMIN 2.6*   < > 2.3* 2.0* 2.0*   < > = values in this interval not displayed.   No results for input(s): LIPASE, AMYLASE in the last 168 hours. No results for input(s): AMMONIA in the last 168 hours. CBC: Recent Labs  Lab  09/06/19 0343 09/06/19 0343 09/07/19 0532 09/07/19 0532 09/08/19 1544 09/09/19 0424 09/10/19 0406  WBC 5.9   < > 6.7   < > 6.7 9.0 10.2  NEUTROABS 5.1  --   --   --   --   --   --   HGB 7.5*   < > 7.2*   < > 8.7* 7.0* 7.4*  HCT 22.9*   < > 22.1*   < > 26.3* 20.9* 22.6*  MCV 100.0  --  100.5*  --  100.4* 97.7 98.7  PLT 155   < > 159   < > 171 155 126*   < > = values in this interval not displayed.   Cardiac Enzymes: No results for input(s): CKTOTAL, CKMB, CKMBINDEX, TROPONINI in the last 168 hours. CBG: Recent Labs  Lab 09/10/19 1948 09/10/19 2335 09/11/19  0008 09/11/19 0418 09/11/19 0805  GLUCAP 105* 149* 94 76 93    Iron Studies: No results for input(s): IRON, TIBC, TRANSFERRIN, FERRITIN in the last 72 hours. Studies/Results: DG CHEST PORT 1 VIEW  Result Date: 09/10/2019 CLINICAL DATA:  Acute respiratory failure with hypoxia EXAM: PORTABLE CHEST 1 VIEW COMPARISON:  Yesterday FINDINGS: Small pleural effusions with superimposed lower lobe atelectasis by most recent chest CT. Cardiomegaly. Endotracheal tube tip is at the clavicular heads, slightly higher than before. The enteric tube at least reaches the stomach. IMPRESSION: Continued low volume chest with layering effusions and lower lobe atelectasis. Electronically Signed   By: Monte Fantasia M.D.   On: 09/10/2019 05:45   . acetaminophen  325 mg Per NG tube BID  . aspirin  81 mg Per NG tube q morning - 10a  . atorvastatin  20 mg Per NG tube q1800  . calcitRIOL  0.25 mcg Per NG tube Q M,W,F  . chlorhexidine gluconate (MEDLINE KIT)  15 mL Mouth Rinse BID  . Chlorhexidine Gluconate Cloth  6 each Topical Daily  . Chlorhexidine Gluconate Cloth  6 each Topical Q0600  . cholecalciferol  4,000 Units Per NG tube q morning - 10a  . darbepoetin (ARANESP) injection - NON-DIALYSIS  150 mcg Subcutaneous Q Thu-1800  . furosemide  80 mg Intravenous Q12H  . heparin  5,000 Units Subcutaneous Q8H  . influenza vaccine adjuvanted  0.5 mL  Intramuscular Tomorrow-1000  . insulin aspart  0-6 Units Subcutaneous Q4H  . insulin glargine  16 Units Subcutaneous QHS  . levothyroxine  25 mcg Per NG tube Q0600  . mouth rinse  15 mL Mouth Rinse 10 times per day  . metoprolol tartrate  12.5 mg Per Tube BID  . montelukast  10 mg Per Tube QHS  . multivitamin  15 mL Per Tube Daily  . neomycin-bacitracin-polymyxin  1 application Topical Daily  . pantoprazole (PROTONIX) IV  40 mg Intravenous Daily  . primidone  50 mg Per NG tube q morning - 10a  . pyridOXINE  200 mg Per NG tube BID  . risperiDONE  0.5 mg Per NG tube BID  . sodium chloride flush  3 mL Intravenous Q12H  . traZODone  50 mg Per NG tube Daily    BMET    Component Value Date/Time   NA 141 09/11/2019 0412   K 3.6 09/11/2019 0412   CL 105 09/11/2019 0412   CO2 23 09/11/2019 0412   GLUCOSE 73 09/11/2019 0412   BUN 73 (H) 09/11/2019 0412   CREATININE 7.50 (H) 09/11/2019 0412   CREATININE 2.54 (H) 08/02/2018 1411   CALCIUM 7.7 (L) 09/11/2019 0412   GFRNONAA 5 (L) 09/11/2019 0412   GFRAA 6 (L) 09/11/2019 0412   CBC    Component Value Date/Time   WBC 10.2 09/10/2019 0406   RBC 2.29 (L) 09/10/2019 0406   HGB 7.4 (L) 09/10/2019 0406   HCT 22.6 (L) 09/10/2019 0406   PLT 126 (L) 09/10/2019 0406   MCV 98.7 09/10/2019 0406   MCH 32.3 09/10/2019 0406   MCHC 32.7 09/10/2019 0406   RDW 14.5 09/10/2019 0406   LYMPHSABS 0.5 (L) 09/06/2019 0343   MONOABS 0.3 09/06/2019 0343   EOSABS 0.0 09/06/2019 0343   BASOSABS 0.0 09/06/2019 0343    Assessment/Plan:  1. AKI/subacute kidney injury vs progressive CKD - underlying CKD due to DM and followed by Dr. Lowanda Foster.  She is volume overloaded and BUN/Cr climbing with diuresis.  Will initiate HD today and  follow mental status 1. S/p left femoral HD catheter 09/10/19 2. Plan to initiate serial HD with UF as tolerated today. 2. Acute hypoxic respiratory failure- acute decompensation on 09/08/19.  She was subsequently intubated and  transferred to the ICU.  CT angio negative for PE.  Plan to attempt UF with HD as tolerated and PCCM to reassess extubation after volume removal. 3. Vascular access- temporary left femoral HD catheter placed 09/10/19 as she was too unstable for [facility] with tachycardia and hypotension.  Appreciate assistance of Dr. Constance Haw. 4. Acute on chronic diastolic CHF- volume overloaded and will UF as tolerated 5. Anemia of CKD- on ESA. Transfuse prn. 6. Morbid obesity/OSA- intubated 7. Tachycardia- improved with cardizem/metoprolol. Donetta Potts, MD [employer] [phone]

## 2019-09-11 NOTE — Progress Notes (Signed)
@   1345 Patient noted to be alert with eyes open, slightly nods yes/no but did not turn head to the left and was only facing to the right. Fentanyl drip turned off and Dr Melvyn Novas in to assess. Patient not moving arms or legs on command. Head CT ordered and patient transferred down on portable monitor with RN and respiratory. Dr Joesph Fillers made aware also. Patient continues to be alert and will follow RN with her eyes. Fentanyl remains off. Patient currently does not appear in any distress, vital signs stable. Bilateral arms elevated on pillows.

## 2019-09-11 NOTE — Progress Notes (Addendum)
PULMONARY / CRITICAL CARE MEDICINE   NAME:  [name], MRN:  [id number], DOB:  Oct 29, 1950, LOS: 5 ADMISSION DATE:  09/06/2019, CONSULTATION DATE:  1/25 REFERRING MD:  [facility], CHIEF COMPLAINT:  resp failure   BRIEF HISTORY:     [name] remote smoker carries dx of COPD/AB on pred ? How long and saba admitted with progressive wt gain, leg swelling sob with w/u c/w anasarca / bilateral effusions  more sob 1/23 and required intubation / mech with CTa neg PE, positive for bilateral effusions with small lung voumes and PCCM asked so see 1/25    HISTORY OF PRESENT ILLNESS   No direct hx availabe but from H/P:  K[age] y.o. female   HTN, DM (on trulicity), hyperlipidemia, diastolic heart failure, schizoaffective D/O with episodes of catatonia and sz d/o- on primidone/cymbalta and risperdone. She also seems to have CKD- crt in the low to mid 2's in early 2020- then a crt in the system from October 2020 that was 3, followed by [name] as her neurologist who does not believe the patient has frank seizures --Presented to the ED with worsening shortness of breath, worsening lower extremity edema and decreased urine output despite reported  compliance with diuretics at home -- In the ED chest x-rays consistent with CHF -Creatinine admit  up to 6.34 creatinine was 3.1 on 06/11/2019 and creatinine was 2.1 on 10/09/2018 SIGNIFICANT PAST MEDICAL HISTORY    . Anxiety   . Asthma   . Back pain   . Barrett esophagus   . CHF (congestive heart failure) ([place])   . CKD (chronic kidney disease) stage 3, GFR 30-59 ml/min   . COPD (chronic obstructive pulmonary disease) ([name])   . Depression   . Diastolic heart failure ([name])   . Essential hypertension   . Fibromyalgia   . GERD (gastroesophageal reflux disease)   . Gout   . History of pericarditis   . Hyperlipidemia   . Hypothyroid   . Major depressive disorder   . Memory loss   . Nephrolithiasis    2008  . Obesity hypoventilation  syndrome (HCC)    CPAP  . Obstructive sleep apnea   . PE (pulmonary embolism)    2010  . Peripheral neuropathy   . PTSD (post-traumatic stress disorder)   . Rheumatoid arthritis ([place])   . Schizophrenia ([name])   . Secondary Parkinson disease ([name]) 10/19/2018  . Steroid dependence ([name])   . Type 2 diabetes mellitus ([name] )   . Vitamin D deficiency    . APPENDECTOMY    . CHOLECYSTECTOMY    . COLONOSCOPY     benign polyps (12/2000), repeat colonoscopy performed in 2007  . ENDOMETRIAL BIOPSY     10/03 benign columnar mucosa (limited material)  . KIDNEY SURGERY       SIGNIFICANT EVENTS:  Respiratory  1/23 4pm resp distress on floor, rapid response > intubated and transferred to ICU  STUDIES:   CTa  Chest 1/23 1. No CT evidence of acute pulmonary embolism. 2. Moderate severity left upper lobe and right lower lobe atelectasis and/or infiltrate with marked severity consolidation seen throughout the entire right lower lobe. 3. Moderate size bilateral pleural effusions, right greater than Left. CT Head 1/23 no acute cahnge CT head 1/26 (? L side neglect) >>>   CULTURES:  Covid 1/21 neg MRSA pcr  1/23 neg   ANTIBIOTICS:  Levaquin 1/24 >>>  LINES/TUBES:  Oral ET  1/23 L Fem HD 1/25    CONSULTANTS:  Neprology 1/21 PCCM 1/25   Scheduled Meds: . acetaminophen  325 mg Per NG tube BID  . aspirin  81 mg Per NG tube q morning - 10a  . atorvastatin  20 mg Per NG tube q1800  . calcitRIOL  0.25 mcg Per NG tube Q M,W,F  . chlorhexidine gluconate (MEDLINE KIT)  15 mL Mouth Rinse BID  . Chlorhexidine Gluconate Cloth  6 each Topical Daily  . Chlorhexidine Gluconate Cloth  6 each Topical Q0600  . cholecalciferol  4,000 Units Per NG tube q morning - 10a  . darbepoetin (ARANESP) injection - NON-DIALYSIS  150 mcg Subcutaneous Q Thu-1800  . feeding supplement (PRO-STAT SUGAR FREE 64)  30 mL Per Tube BID  . furosemide  80 mg Intravenous Q12H  . heparin  5,000 Units  Subcutaneous Q8H  . influenza vaccine adjuvanted  0.5 mL Intramuscular Tomorrow-1000  . insulin aspart  0-6 Units Subcutaneous Q4H  . insulin glargine  16 Units Subcutaneous QHS  . levothyroxine  25 mcg Per NG tube Q0600  . mouth rinse  15 mL Mouth Rinse 10 times per day  . metoprolol tartrate  12.5 mg Per Tube BID  . montelukast  10 mg Per Tube QHS  . multivitamin  15 mL Per Tube Daily  . neomycin-bacitracin-polymyxin  1 application Topical Daily  . pantoprazole (PROTONIX) IV  40 mg Intravenous Daily  . primidone  50 mg Per NG tube q morning - 10a  . pyridOXINE  200 mg Per NG tube BID  . risperiDONE  0.5 mg Per NG tube BID  . sodium chloride flush  3 mL Intravenous Q12H  . traZODone  50 mg Per NG tube Daily   Continuous Infusions: . sodium chloride    . feeding supplement (VITAL AF 1.2 CAL) 1,000 mL (09/11/19 1227)  . fentaNYL infusion INTRAVENOUS 25 mcg/hr (09/11/19 1017)  . levofloxacin (LEVAQUIN) IV     PRN Meds:.sodium chloride, acetaminophen **OR** acetaminophen, albuterol, albuterol, fentaNYL, hydrOXYzine, labetalol, LORazepam, metoprolol tartrate, midazolam, ondansetron **OR** ondansetron (ZOFRAN) IV, polyethylene glycol, sodium chloride flush   SUBJECTIVE:  Less responsive ? R gaze preference per nursing   CONSTITUTIONAL: BP (!) 122/55   Pulse 77   Temp 99.3 F (37.4 C) (Axillary)   Resp 20   Ht 5' 2" (1.575 m)   Wt 99.9 kg   SpO2 98%   BMI 40.28 kg/m   I/O last 3 completed shifts: In: 570.7 [I.V.:390.7; NG/GT:180] Out: 1200 [Urine:1100; Emesis/NG output:100]     Vent Mode: PRVC FiO2 (%):  [40 %] 40 % Set Rate:  [20 bmp] 20 bmp Vt Set:  [400 mL] 400 mL PEEP:  [5 cmH20] 5 cmH20 Plateau Pressure:  [17 cmH20-19 cmH20] 18 cmH20  PHYSICAL EXAM: General:  Obese wf no longer responding to verbal Neuro: floppy arms  More on L, won't look to L  HEENT:  Oral et  Cardiovascular:  RRR no s3 / 1+ pitting edema both LEs Lungs: distant bs s wheeze  Abdomen:   Obese/ soft  Musculoskeletal:  No deformities  Skin:  Mild chronic venous stasis changes both LE's      RESOLVED PROBLEM LIST   ASSESSMENT AND PLAN    1) Acute resp failure hypoxemic and hypercarbic present on admit  in setting of vol overload, acute renal failure and anasarca/pleural effusions  related to low albumin  - well compensated on present vent settings s air trapping but poor Clung due to body habitus and pleural effusions / no evidence  of airflow obstruction despite previous use of saba and prednisone ? Why?  >>> leave on vent until achieve improved vol overload     2) Acute renal failure/ low albumin > bilateral pleural effusions. >>> agree with plans for HD then can address vent weaning - may need therapeutic thoracentesis or chest tubes if not mobilizing 3rd spaced fluids in pleural space with HD   3) Acute metabolic encephalopathy secondary to hypercarbia, present on admit. - was f/c 1/25 now ignoring L side > CT head ordered    4) Normocytic anemia typical of renal failure > rx wit h[facility] / renal when HD starts up      Lab Results  Component Value Date   HGB 7.4 (L) 09/10/2019   HGB 7.0 (L) 09/09/2019   HGB 8.7 (L) 09/08/2019     SUMMARY OF TODAY'S PLAN:  Remain on vent until pull fluid off per HD   Best Practice / Goals of Care / Disposition.   DVT PROPHYLAXIS:  Sub Q hep SUP: PPI NUTRITION:per [facility]  MOBILITY:as tol  GOALS OF CARE: per [facility]  FAMILY DISCUSSIONS: per [facility]  DISPOSITION remain ICU/ on Vent   LABS  Glucose Recent Labs  Lab 09/10/19 1948 09/10/19 2335 09/11/19 0008 09/11/19 0418 09/11/19 0805 09/11/19 1101  GLUCAP 105* 149* 94 76 93 93    BMET Recent Labs  Lab 09/09/19 0424 09/10/19 0406 09/11/19 0412  NA 138 139 141  K 3.2* 3.7 3.6  CL 101 102 105  CO2 _0 BUN 70* 69* 73*  CREATININE 6.42* 6.93* 7.50*  GLUCOSE 100* 129* 73    Liver Enzymes Recent Labs  Lab 09/06/19 0343 09/07/19 0532 09/09/19 0424  09/10/19 0406 09/11/19 0412  AST 19  --   --   --   --   ALT 20  --   --   --   --   ALKPHOS 73  --   --   --   --   BILITOT 0.5  --   --   --   --   ALBUMIN 2.6*   < > 2.3* 2.0* 2.0*   < > = values in this interval not displayed.    Electrolytes Recent Labs  Lab 09/09/19 0424 09/10/19 0406 09/11/19 0412  CALCIUM 8.1* 7.7* 7.7*  MG  --  2.3  --   PHOS 6.4* 6.5* 7.5*    CBC Recent Labs  Lab 09/08/19 1544 09/09/19 0424 09/10/19 0406  WBC 6.7 9.0 10.2  HGB 8.7* 7.0* 7.4*  HCT 26.3* 20.9* 22.6*  PLT 171 155 126*    ABG Recent Labs  Lab 09/08/19 1755 09/09/19 0424 09/10/19 0409  PHART 7.363 7.420 7.412  PCO2ART 46.7 40.8 41.0  PO2ART 77.8* 65.6* 66.0*    Coag's Recent Labs  Lab 09/09/19 1224  INR 1.1    Sepsis Markers No results for input(s): LATICACIDVEN, PROCALCITON, O2SATVEN in the last 168 hours.  Cardiac Enzymes No results for input(s): TROPONINI, PROBNP in the last 168 hours.   The patient is critically ill with multiple organ systems failure and requires high complexity decision making for assessment and support, frequent evaluation and titration of therapies, application of advanced monitoring technologies and extensive interpretation of multiple databases. Critical Care Time devoted to patient care services described in this note is 40 minutes.    Christinia Gully, MD Pulmonary [facility] [place] [phone] After 5:30 PM or weekends, use Beeper [phone]

## 2019-09-11 NOTE — Progress Notes (Signed)
RT transported patient with RN to and from CT, no complications noted

## 2019-09-12 DIAGNOSIS — N189 Chronic kidney disease, unspecified: Secondary | ICD-10-CM | POA: Diagnosis not present

## 2019-09-12 DIAGNOSIS — E8770 Fluid overload, unspecified: Secondary | ICD-10-CM | POA: Diagnosis not present

## 2019-09-12 DIAGNOSIS — G9341 Metabolic encephalopathy: Secondary | ICD-10-CM | POA: Diagnosis not present

## 2019-09-12 DIAGNOSIS — J9601 Acute respiratory failure with hypoxia: Secondary | ICD-10-CM | POA: Diagnosis not present

## 2019-09-12 DIAGNOSIS — R0602 Shortness of breath: Secondary | ICD-10-CM | POA: Diagnosis not present

## 2019-09-12 DIAGNOSIS — E8779 Other fluid overload: Secondary | ICD-10-CM | POA: Diagnosis not present

## 2019-09-12 DIAGNOSIS — N179 Acute kidney failure, unspecified: Secondary | ICD-10-CM | POA: Diagnosis not present

## 2019-09-12 LAB — HEPATITIS B SURFACE ANTIGEN: Hepatitis B Surface Ag: NONREACTIVE

## 2019-09-12 LAB — HEPATITIS B CORE ANTIBODY, TOTAL: Hep B Core Total Ab: NONREACTIVE

## 2019-09-12 LAB — RENAL FUNCTION PANEL
Albumin: 1.8 g/dL — ABNORMAL LOW (ref 3.5–5.0)
Anion gap: 12 (ref 5–15)
BUN: 58 mg/dL — ABNORMAL HIGH (ref 8–23)
CO2: 24 mmol/L (ref 22–32)
Calcium: 7.9 mg/dL — ABNORMAL LOW (ref 8.9–10.3)
Chloride: 102 mmol/L (ref 98–111)
Creatinine, Ser: 6.36 mg/dL — ABNORMAL HIGH (ref 0.44–1.00)
GFR calc Af Amer: 7 mL/min — ABNORMAL LOW (ref >60)
GFR calc non Af Amer: 6 mL/min — ABNORMAL LOW (ref >60)
Glucose, Bld: 131 mg/dL — ABNORMAL HIGH (ref 70–99)
Phosphorus: 6.5 mg/dL — ABNORMAL HIGH (ref 2.5–4.6)
Potassium: 3.3 mmol/L — ABNORMAL LOW (ref 3.5–5.1)
Sodium: 138 mmol/L (ref 135–145)

## 2019-09-12 LAB — GLUCOSE, CAPILLARY
Glucose-Capillary: 111 mg/dL — ABNORMAL HIGH (ref 70–99)
Glucose-Capillary: 119 mg/dL — ABNORMAL HIGH (ref 70–99)
Glucose-Capillary: 155 mg/dL — ABNORMAL HIGH (ref 70–99)
Glucose-Capillary: 137 mg/dL — ABNORMAL HIGH (ref 70–99)
Glucose-Capillary: 171 mg/dL — ABNORMAL HIGH (ref 70–99)
Glucose-Capillary: 165 mg/dL — ABNORMAL HIGH (ref 70–99)

## 2019-09-12 MED ORDER — POTASSIUM CHLORIDE CRYS ER 20 MEQ PO TBCR
40.0000 meq | EXTENDED_RELEASE_TABLET | Freq: Once | ORAL | Status: AC
  Administered 2019-09-12: 40 meq via ORAL
  Filled 2019-09-12: qty 2

## 2019-09-12 MED ORDER — VITAL AF 1.2 CAL PO LIQD
1000.0000 mL | ORAL | Status: DC
  Administered 2019-09-12 – 2019-09-14 (×4): 1000 mL

## 2019-09-12 MED ORDER — FENTANYL BOLUS VIA INFUSION
25.0000 ug | INTRAVENOUS | Status: DC | PRN
  Filled 2019-09-12: qty 25

## 2019-09-12 MED ORDER — FREE WATER
60.0000 mL | Status: DC
  Administered 2019-09-12 – 2019-09-17 (×16): 60 mL

## 2019-09-12 MED ORDER — HEPARIN SODIUM (PORCINE) 1000 UNIT/ML DIALYSIS
20.0000 [IU]/kg | INTRAMUSCULAR | Status: DC | PRN
  Filled 2019-09-12 (×2): qty 2

## 2019-09-12 MED ORDER — HEPARIN SODIUM (PORCINE) 1000 UNIT/ML DIALYSIS
3100.0000 [IU] | INTRAMUSCULAR | Status: DC | PRN
  Administered 2019-09-19: 3100 [IU] via INTRAVENOUS_CENTRAL
  Filled 2019-09-12 (×4): qty 4

## 2019-09-12 MED ORDER — FENTANYL CITRATE (PF) 100 MCG/2ML IJ SOLN
25.0000 ug | INTRAMUSCULAR | Status: DC | PRN
  Administered 2019-09-14: 100 ug via INTRAVENOUS
  Administered 2019-09-14 – 2019-09-15 (×2): 25 ug via INTRAVENOUS
  Filled 2019-09-12: qty 2

## 2019-09-12 MED ORDER — HEPARIN SODIUM (PORCINE) 5000 UNIT/ML IJ SOLN
5000.0000 [IU] | Freq: Three times a day (TID) | INTRAMUSCULAR | Status: DC
  Administered 2019-09-12 – 2019-09-13 (×4): 5000 [IU] via SUBCUTANEOUS
  Filled 2019-09-12 (×4): qty 1

## 2019-09-12 NOTE — Progress Notes (Signed)
PROGRESS NOTE    [name]  DEY:[id number] DOB: [birth year] DOA: 09/06/2019 PCP: Glenda Chroman, MD    Brief Narrative:  69 year old female with history of hypertension, diabetes, diastolic heart failure, schizoaffective disorder, chronic kidney disease stage IV, who presented to the emergency room with acute kidney injury on chronic kidney disease and creatinine of 6.3.  She was noted to be volume overload.  She is admitted to the hospital, started on intravenous Lasix and is being followed by nephrology.  She has not had any significant improvement in renal function and is felt that she will likely need hemodialysis.  Plans were to start hemodialysis on 1/25.  On 1/23, patient had a sudden change in mental status.  She had increased respiratory effort and blood gas showed respiratory acidosis.  Patient was transferred to the ICU and was intubated.  Further work-up for mental status change including CT head has been ordered. -Remains intubated and sedated -Initiated on HD on 09/11/2019   Assessment & Plan:   Principal Problem:   Acute kidney injury superimposed on chronic kidney disease ([place]) Active Problems:   Volume overload   SVT (supraventricular tachycardia) (HCC)   Type 2 diabetes mellitus with diabetic polyneuropathy, without long-term current use of insulin (HCC)   Normocytic anemia   Schizophrenia (HCC)   Depression   Essential hypertension   Chronic diastolic heart failure (HCC)   CKD (chronic kidney disease), stage IV (HCC)   Obesity, Class III, BMI 40-49.9 (morbid obesity) ([place])   Type 2 diabetes mellitus ([place])   Hypothyroidism   On mechanically assisted ventilation (HCC)   Acute metabolic encephalopathy   Acute respiratory failure with hypoxia and hypercapnia (HCC)    1)Acute respiratory failure with hypoxia and hypercapnia--multifactorial etiology including volume overload in the setting of ESRD-Intubated 09/08/2019  -Did well with CPAP trial today--per weaning  protocol -PCCM consult appreciated -Weaned off fentanyl drip, okay to use as needed IV fentanyl as needed -Keep intubated until volume status improves with HD -Continue Levaquin -CTA chest without PE, has bilateral pleural effusions -Initiated on hemodialysis on 09/11/2019  2)AKI----acute kidney injury on CKD stage -IV Versus Progression towards CKD 5,GFR is now less than 10, -had uremic symptoms prior to intubation --Elevated creatinine noted, creatinine was 3.1 on 06/11/2019 and creatinine was 2.1 on 10/09/2018 -renal ultrasound without obstructive uropathy there is evidence of bilateral medical renal disease -Nephrology consult appreciated - patient was agreeable to undergo hemodialysis prior to intubation -Stop PTA torsemide, Failed on high-dose IV Lasix, continues on Lasix 80 mg twice daily -renally adjust medications, avoid nephrotoxic agents / dehydration / hypotension -PTH -263 -Had first hemodialysis session on 09/11/2019  3) PSVT----had episode of paroxysmal SVT on 09/10/2019 responded well to IV adenosine, continue metoprolol as ordered  4)HFpEF--last known EF 60 to 65% based on echo from January 2020, patient had diastolic dysfunction -Continue IV Lasix and HD sessions to address volume status -Metoprolol as ordered  5)HTN-stage II--amlodipine discontinued due to edema and to allow room to titrate diuretics, discontinue prazosin,  -Metoprolol as ordered  6)DM2with peripheral neuropathy--A1c is 5.2 reflecting excellent diabetic control PTA -PTA patient was on Tradjenta,Trulicity discontinued, and Lantus insulin  -Allow some permissive Hyperglycemia rather than risk life-threatening hypoglycemia in a patient with unreliable oral intake. Use Novolog/Humalog Sliding scale insulin with Accu-Cheks/Fingersticks as ordered  7)Hypothyroidism--continue levothyroxine 25 mcg daily,TSH was 2.9 in April 2020,  8))Neuropsychiatric disorders---underlying schizoaffective  disorder, as well as questionable seizure attacks in the past, followed by [name] as  her neurologist,who does not believe the patient has genuine seizures---PTA was on Lamictal for mood stabilization, trazodone 50 mg nightly,,Risperdal 0.5 mg twice daily, vitamin B6, Mysoline 50 mg daily, and as needed lorazepam  7)Morbid obesity/OSA---PTA was on CPAP nightly , currently intubated  8)Anemia of CKD---ESA/EPO agent per nephrology team, ferritin 19 and TIBC 241 and serum iron is 62, check stool occult blood  9)FEN--vital 1.2 tube feeding at 40 mL an hour  with water flushes through OG tube, Accu-Cheks as ordered  Disposition/Need for in-Hospital Stay- -currently intubated for hypoxic and hypercapnic respiratory failure -Started on hemodialysis this admission, will  Need clipping process prior to discharge  Code Status : Full  Family Communication:   na  Consults  :  Neprhology/general surgery/PCCM  DVT Prophylaxis  :   - Heparin - SCDs CRITICAL CARE Performed by: Roxan Hockey   Total critical care time: 43 minutes  Critical care time was exclusive of separately billable procedures and treating other patients.  Critical care was necessary to treat or prevent imminent or life-threatening deterioration. -Did well with CPAP trial and weaning protocol  -To remain intubated until volume status has been adequately addressed with HD sessions  Critical care was time spent personally by me on the following activities: development of treatment plan with patient and/or surrogate as well as nursing, discussions with consultants, evaluation of patient's response to treatment, examination of patient, obtaining history from patient or surrogate, ordering and performing treatments and interventions, ordering and review of laboratory studies, ordering and review of radiographic studies, pulse oximetry and re-evaluation of patient's condition.   DVT prophylaxis: SCDs Code  Status: Full code Family Communication: Patient is a resident of a group home.  She does not have any family.  Contacted her emergency contact, [name] who is the administrator at the facility.  She said that patient does not have any designated power of attorney. Disposition Plan: discharge home once renal and pulmonary status has stabilized --To remain intubated until volume status has been adequately addressed with HD sessions -Clipping process started   Consultants:   Nephrology/PCCM  Procedures:   ETT 1/23> Temporary Dialysis line placement, left femoral under ultrasound guidance    Antimicrobials:   Levaquin 1/24>   Subjective:  --To remain intubated until volume status has been adequately addressed with HD sessions  -Tolerating tube feeding well -Off fentanyl drip  Objective: Vitals:   09/12/19 1100 09/12/19 1101 09/12/19 1135 09/12/19 1200  BP: 140/63   133/60  Pulse: 79  81 80  Resp: 19  17 (!) 21  Temp:  99.5 F (37.5 C)    TempSrc:  Axillary    SpO2: 99%  99% 99%  Weight:      Height:        Intake/Output Summary (Last 24 hours) at 09/12/2019 1303 Last data filed at 09/12/2019 1100 Gross per 24 hour  Intake 602.26 ml  Output 1400 ml  Net -797.74 ml   Filed Weights   09/09/19 0500 09/11/19 2230 09/12/19 0500  Weight: 99.9 kg 100 kg 97.6 kg    Examination:  General exam: intubated on ventilator HEENT-ET tube and OG tube Respiratory system: diminished breath sounds on right, crackles at bases.  . Cardiovascular system:RRR. No murmurs, rubs, gallops. Gastrointestinal system: Abdomen is nondistended, soft and   Normal bowel sounds heard. Central nervous system: .  Intubated and sedated Extremities: 1-2+ edema bilaterally Skin: No rashes, lesions or ulcers Psychiatry: unable to assess since she is on a vent MSK-left  femoral HD catheter   Data Reviewed: I have personally reviewed following labs and imaging studies  CBC: Recent Labs  Lab  09/06/19 0343 09/07/19 0532 09/08/19 1544 09/09/19 0424 09/10/19 0406  WBC 5.9 6.7 6.7 9.0 10.2  NEUTROABS 5.1  --   --   --   --   HGB 7.5* 7.2* 8.7* 7.0* 7.4*  HCT 22.9* 22.1* 26.3* 20.9* 22.6*  MCV 100.0 100.5* 100.4* 97.7 98.7  PLT 155 159 171 155 517*   Basic Metabolic Panel: Recent Labs  Lab 09/08/19 0525 09/08/19 0525 09/08/19 1544 09/09/19 0424 09/10/19 0406 09/11/19 0412 09/12/19 0403  NA 138   < > 138 138 139 141 138  K 3.5   < > 4.3 3.2* 3.7 3.6 3.3*  CL 99   < > 99 101 102 105 102  CO2 28   < > '27 25 23 23 24  '$ GLUCOSE 110*   < > 223* 100* 129* 73 131*  BUN 68*   < > 70* 70* 69* 73* 58*  CREATININE 6.26*   < > 6.75* 6.42* 6.93* 7.50* 6.36*  CALCIUM 8.0*   < > 8.2* 8.1* 7.7* 7.7* 7.9*  MG  --   --   --   --  2.3  --   --   PHOS 6.8*  --   --  6.4* 6.5* 7.5* 6.5*   < > = values in this interval not displayed.   GFR: Estimated Creatinine Clearance: 9.4 mL/min (A) (by C-G formula based on SCr of 6.36 mg/dL (H)). Liver Function Tests: Recent Labs  Lab 09/06/19 0343 09/07/19 0532 09/08/19 0525 09/09/19 0424 09/10/19 0406 09/11/19 0412 09/12/19 0403  AST 19  --   --   --   --   --   --   ALT 20  --   --   --   --   --   --   ALKPHOS 73  --   --   --   --   --   --   BILITOT 0.5  --   --   --   --   --   --   PROT 5.8*  --   --   --   --   --   --   ALBUMIN 2.6*   < > 2.5* 2.3* 2.0* 2.0* 1.8*   < > = values in this interval not displayed.   No results for input(s): LIPASE, AMYLASE in the last 168 hours. No results for input(s): AMMONIA in the last 168 hours. Coagulation Profile: Recent Labs  Lab 09/09/19 1224  INR 1.1   Cardiac Enzymes: No results for input(s): CKTOTAL, CKMB, CKMBINDEX, TROPONINI in the last 168 hours. BNP (last 3 results) No results for input(s): PROBNP in the last 8760 hours. HbA1C: No results for input(s): HGBA1C in the last 72 hours. CBG: Recent Labs  Lab 09/11/19 2001 09/12/19 0058 09/12/19 0405 09/12/19 0803  09/12/19 1059  GLUCAP 132* 111* 119* 137* 155*   Lipid Profile: No results for input(s): CHOL, HDL, LDLCALC, TRIG, CHOLHDL, LDLDIRECT in the last 72 hours. Thyroid Function Tests: No results for input(s): TSH, T4TOTAL, FREET4, T3FREE, THYROIDAB in the last 72 hours. Anemia Panel: No results for input(s): VITAMINB12, FOLATE, FERRITIN, TIBC, IRON, RETICCTPCT in the last 72 hours. Sepsis Labs: No results for input(s): PROCALCITON, LATICACIDVEN in the last 168 hours.  Recent Results (from the past 240 hour(s))  SARS CORONAVIRUS 2 (TAT 6-24 HRS) Nasopharyngeal Nasopharyngeal Swab  Status: None   Collection Time: 09/06/19  3:43 AM   Specimen: Nasopharyngeal Swab  Result Value Ref Range Status   SARS Coronavirus 2 NEGATIVE NEGATIVE Final    Comment: (NOTE) SARS-CoV-2 target nucleic acids are NOT DETECTED. The SARS-CoV-2 RNA is generally detectable in upper and lower respiratory specimens during the acute phase of infection. Negative results do not preclude SARS-CoV-2 infection, do not rule out co-infections with other pathogens, and should not be used as the sole basis for treatment or other patient management decisions. Negative results must be combined with clinical observations, patient history, and epidemiological information. The expected result is Negative. Fact Sheet for Patients: SugarRoll.be Fact Sheet for Healthcare Providers: https://www.woods-mathews.com/ This test is not yet approved or cleared by the [place] FDA and  has been authorized for detection and/or diagnosis of SARS-CoV-2 by FDA under an Emergency Use Authorization (EUA). This EUA will remain  in effect (meaning this test can be used) for the duration of the COVID-19 declaration under Section 56 4(b)(1) of the Act, 21 U.S.C. section 360bbb-3(b)(1), unless the authorization is terminated or revoked sooner. Performed at [facility], [place] [address]  [place], [place] [address]   MRSA PCR Screening     Status: None   Collection Time: 09/08/19 12:30 PM   Specimen: Nasal Mucosa; Nasopharyngeal  Result Value Ref Range Status   MRSA by PCR NEGATIVE NEGATIVE Final    Comment:        The GeneXpert MRSA Assay (FDA approved for NASAL specimens only), is one component of a comprehensive MRSA colonization surveillance program. It is not intended to diagnose MRSA infection nor to guide or monitor treatment for MRSA infections. Performed at [facility], [address] [place], [place] [address]          Radiology Studies: CT HEAD WO CONTRAST  Result Date: 09/11/2019 CLINICAL DATA:  Onset left-side neglect today. EXAM: CT HEAD WITHOUT CONTRAST TECHNIQUE: Contiguous axial images were obtained from the base of the skull through the vertex without intravenous contrast. COMPARISON:  Head CT 09/08/2019.  Brain MRI 09/27/2018. FINDINGS: Brain: No evidence of acute infarction, hemorrhage, hydrocephalus, extra-axial collection or mass lesion/mass effect. Vascular: No hyperdense vessel.  Atherosclerosis noted. Skull: Intact.  No focal lesion. Sinuses/Orbits: Small right mastoid effusion is seen. Mucous retention cyst or polyp left maxillary sinus noted. Mild mucosal thickening scattered ethmoid air cells, sphenoid sinuses and right maxillary sinus also noted. Other: None. IMPRESSION: No acute abnormality finding to explain the patient's symptoms. Atherosclerosis. Mild sinus disease. Electronically Signed   By: Inge Rise M.D.   On: 09/11/2019 15:41        Scheduled Meds: . acetaminophen  325 mg Per NG tube BID  . aspirin  81 mg Per NG tube q morning - 10a  . atorvastatin  20 mg Per NG tube q1800  . calcitRIOL  0.25 mcg Per NG tube Q M,W,F  . chlorhexidine gluconate (MEDLINE KIT)  15 mL Mouth Rinse BID  . Chlorhexidine Gluconate Cloth  6 each Topical Daily  . Chlorhexidine Gluconate Cloth  6 each Topical Q0600  . cholecalciferol  4,000 Units  Per NG tube q morning - 10a  . darbepoetin (ARANESP) injection - NON-DIALYSIS  150 mcg Subcutaneous Q Thu-1800  . feeding supplement (PRO-STAT SUGAR FREE 64)  30 mL Per Tube BID  . free water  60 mL Per Tube Q4H  . furosemide  80 mg Intravenous Q12H  . heparin injection (subcutaneous)  5,000 Units Subcutaneous  Q8H  . influenza vaccine adjuvanted  0.5 mL Intramuscular Tomorrow-1000  . insulin aspart  0-6 Units Subcutaneous Q4H  . insulin glargine  16 Units Subcutaneous QHS  . levothyroxine  25 mcg Per NG tube Q0600  . mouth rinse  15 mL Mouth Rinse 10 times per day  . metoprolol tartrate  12.5 mg Per Tube BID  . montelukast  10 mg Per Tube QHS  . multivitamin  15 mL Per Tube Daily  . neomycin-bacitracin-polymyxin  1 application Topical Daily  . pantoprazole (PROTONIX) IV  40 mg Intravenous Daily  . primidone  50 mg Per NG tube q morning - 10a  . pyridOXINE  200 mg Per NG tube BID  . risperiDONE  0.5 mg Per NG tube BID  . sodium chloride flush  3 mL Intravenous Q12H  . traZODone  50 mg Per NG tube Daily   Continuous Infusions: . sodium chloride    . sodium chloride    . sodium chloride    . feeding supplement (VITAL AF 1.2 CAL) 1,000 mL (09/12/19 1049)  . levofloxacin (LEVAQUIN) IV Stopped (09/11/19 1729)     LOS: 6 days   Roxan Hockey, MD [facility]  If 7PM-7AM, please contact night-coverage [email or web address]  09/12/2019, 1:03 PM

## 2019-09-12 NOTE — Procedures (Signed)
    HEMODIALYSIS TREATMENT NOTE (HD #2):  2.5 hour low-heparin treatment completed via left femoral non-tunneled catheter.  Goal met: 2 liters removed.  Hemodynamically stable.  Catheter tolerated prescribed flow with stable pressures.  Rockwell Alexandria, RN

## 2019-09-12 NOTE — Progress Notes (Signed)
Patient ID: [name], female   DOB: 1951/01/29, 69 y.o.   MRN: [id number] S: Pt tolerated her first HD session yesterday with 1 liter UF and no issues.  CT scan negative for acute changes. O:BP 138/61   Pulse 81   Temp 99.6 F (37.6 C) (Axillary)   Resp 20   Ht '5\' 2"'$  (1.575 m)   Wt 97.6 kg   SpO2 100%   BMI 39.35 kg/m   Intake/Output Summary (Last 24 hours) at 09/12/2019 0927 Last data filed at 09/12/2019 0400 Gross per 24 hour  Intake 656.52 ml  Output 1400 ml  Net -743.48 ml   Intake/Output: I/O last 3 completed shifts: In: 929.4 [I.V.:218.4; NG/GT:611; IV Piggyback:100] Out: 4854 [Urine:725; Emesis/NG output:100; Other:1000]  Intake/Output this shift:  No intake/output data recorded. Weight change:  Gen: intubated, eyes open, no sedation but not following commands or interactive CVS: no rub Resp: cta Abd: +BS, soft, NT Ext: 1+ edema of extremities  Recent Labs  Lab 09/06/19 0343 09/06/19 0343 09/07/19 0532 09/08/19 0525 09/08/19 1544 09/09/19 0424 09/10/19 0406 09/11/19 0412 09/12/19 0403  NA 142   < > 140 138 138 138 139 141 138  K 4.0   < > 3.6 3.5 4.3 3.2* 3.7 3.6 3.3*  CL 104   < > 101 99 99 101 102 105 102  CO2 26   < > '27 28 27 25 23 23 24  '$ GLUCOSE 148*   < > 104* 110* 223* 100* 129* 73 131*  BUN 62*   < > 64* 68* 70* 70* 69* 73* 58*  CREATININE 6.34*   < > 6.47* 6.26* 6.75* 6.42* 6.93* 7.50* 6.36*  ALBUMIN 2.6*  --  2.5* 2.5*  --  2.3* 2.0* 2.0* 1.8*  CALCIUM 8.2*   < > 7.9* 8.0* 8.2* 8.1* 7.7* 7.7* 7.9*  PHOS  --   --  6.9* 6.8*  --  6.4* 6.5* 7.5* 6.5*  AST 19  --   --   --   --   --   --   --   --   ALT 20  --   --   --   --   --   --   --   --    < > = values in this interval not displayed.   Liver Function Tests: Recent Labs  Lab 09/06/19 0343 09/07/19 0532 09/10/19 0406 09/11/19 0412 09/12/19 0403  AST 19  --   --   --   --   ALT 20  --   --   --   --   ALKPHOS 73  --   --   --   --   BILITOT 0.5  --   --   --   --   PROT 5.8*   --   --   --   --   ALBUMIN 2.6*   < > 2.0* 2.0* 1.8*   < > = values in this interval not displayed.   No results for input(s): LIPASE, AMYLASE in the last 168 hours. No results for input(s): AMMONIA in the last 168 hours. CBC: Recent Labs  Lab 09/06/19 0343 09/06/19 0343 09/07/19 0532 09/07/19 0532 09/08/19 1544 09/09/19 0424 09/10/19 0406  WBC 5.9   < > 6.7   < > 6.7 9.0 10.2  NEUTROABS 5.1  --   --   --   --   --   --   HGB 7.5*   < > 7.2*   < >  8.7* 7.0* 7.4*  HCT 22.9*   < > 22.1*   < > 26.3* 20.9* 22.6*  MCV 100.0  --  100.5*  --  100.4* 97.7 98.7  PLT 155   < > 159   < > 171 155 126*   < > = values in this interval not displayed.   Cardiac Enzymes: No results for input(s): CKTOTAL, CKMB, CKMBINDEX, TROPONINI in the last 168 hours. CBG: Recent Labs  Lab 09/11/19 1101 09/11/19 1553 09/11/19 2001 09/12/19 0058 09/12/19 0405  GLUCAP 93 100* 132* 111* 119*    Iron Studies: No results for input(s): IRON, TIBC, TRANSFERRIN, FERRITIN in the last 72 hours. Studies/Results: CT HEAD WO CONTRAST  Result Date: 09/11/2019 CLINICAL DATA:  Onset left-side neglect today. EXAM: CT HEAD WITHOUT CONTRAST TECHNIQUE: Contiguous axial images were obtained from the base of the skull through the vertex without intravenous contrast. COMPARISON:  Head CT 09/08/2019.  Brain MRI 09/27/2018. FINDINGS: Brain: No evidence of acute infarction, hemorrhage, hydrocephalus, extra-axial collection or mass lesion/mass effect. Vascular: No hyperdense vessel.  Atherosclerosis noted. Skull: Intact.  No focal lesion. Sinuses/Orbits: Small right mastoid effusion is seen. Mucous retention cyst or polyp left maxillary sinus noted. Mild mucosal thickening scattered ethmoid air cells, sphenoid sinuses and right maxillary sinus also noted. Other: None. IMPRESSION: No acute abnormality finding to explain the patient's symptoms. Atherosclerosis. Mild sinus disease. Electronically Signed   By: Inge Rise M.D.    On: 09/11/2019 15:41   . acetaminophen  325 mg Per NG tube BID  . aspirin  81 mg Per NG tube q morning - 10a  . atorvastatin  20 mg Per NG tube q1800  . calcitRIOL  0.25 mcg Per NG tube Q M,W,F  . chlorhexidine gluconate (MEDLINE KIT)  15 mL Mouth Rinse BID  . Chlorhexidine Gluconate Cloth  6 each Topical Daily  . Chlorhexidine Gluconate Cloth  6 each Topical Q0600  . cholecalciferol  4,000 Units Per NG tube q morning - 10a  . darbepoetin (ARANESP) injection - NON-DIALYSIS  150 mcg Subcutaneous Q Thu-1800  . feeding supplement (PRO-STAT SUGAR FREE 64)  30 mL Per Tube BID  . furosemide  80 mg Intravenous Q12H  . heparin injection (subcutaneous)  5,000 Units Subcutaneous Q8H  . influenza vaccine adjuvanted  0.5 mL Intramuscular Tomorrow-1000  . insulin aspart  0-6 Units Subcutaneous Q4H  . insulin glargine  16 Units Subcutaneous QHS  . levothyroxine  25 mcg Per NG tube Q0600  . mouth rinse  15 mL Mouth Rinse 10 times per day  . metoprolol tartrate  12.5 mg Per Tube BID  . montelukast  10 mg Per Tube QHS  . multivitamin  15 mL Per Tube Daily  . neomycin-bacitracin-polymyxin  1 application Topical Daily  . pantoprazole (PROTONIX) IV  40 mg Intravenous Daily  . primidone  50 mg Per NG tube q morning - 10a  . pyridOXINE  200 mg Per NG tube BID  . risperiDONE  0.5 mg Per NG tube BID  . sodium chloride flush  3 mL Intravenous Q12H  . traZODone  50 mg Per NG tube Daily    BMET    Component Value Date/Time   NA 138 09/12/2019 0403   K 3.3 (L) 09/12/2019 0403   CL 102 09/12/2019 0403   CO2 24 09/12/2019 0403   GLUCOSE 131 (H) 09/12/2019 0403   BUN 58 (H) 09/12/2019 0403   CREATININE 6.36 (H) 09/12/2019 0403   CREATININE 2.54 (H) 08/02/2018 1411  CALCIUM 7.9 (L) 09/12/2019 0403   GFRNONAA 6 (L) 09/12/2019 0403   GFRAA 7 (L) 09/12/2019 0403   CBC    Component Value Date/Time   WBC 10.2 09/10/2019 0406   RBC 2.29 (L) 09/10/2019 0406   HGB 7.4 (L) 09/10/2019 0406   HCT 22.6 (L)  09/10/2019 0406   PLT 126 (L) 09/10/2019 0406   MCV 98.7 09/10/2019 0406   MCH 32.3 09/10/2019 0406   MCHC 32.7 09/10/2019 0406   RDW 14.5 09/10/2019 0406   LYMPHSABS 0.5 (L) 09/06/2019 0343   MONOABS 0.3 09/06/2019 0343   EOSABS 0.0 09/06/2019 0343   BASOSABS 0.0 09/06/2019 0343    Assessment/Plan:  1. AKI/subacute kidney injury vs progressive CKD - underlying CKD due to DM and followed by Dr. Lowanda Foster. She is volume overloaded and BUN/Cr climbing with diuresis. 1. S/p left femoral HD catheter 09/10/19 2. Initiated serial HD on 09/11/19. 3. Plan for HD again today and UF as tolerated. 2. Acute hypoxic respiratory failure- acute decompensation on 09/08/19. She was subsequently intubated and transferred to the ICU. CT angio negative for PE. Plan to attempt UF with HD as tolerated and PCCM to reassess extubation after volume removal. 3. Vascular access- temporary left femoral HD catheter placed 09/10/19 as she was too unstable for [facility] with tachycardia and hypotension.  Appreciate assistance of Dr. Constance Haw. 4. AMS- CT scan negative for acute changes.  Eyes open but not following commands 5. Acute on chronic diastolic CHF- volume overloaded and will UF as tolerated 6. Anemia of CKD- on ESA. Transfuse prn. 7. Morbid obesity/OSA- intubated 8. Tachycardia- improved with cardizem/metoprolol. Donetta Potts, MD [employer] [phone]

## 2019-09-12 NOTE — Progress Notes (Signed)
Fentanyl gtt stopped on patient. 125 mL wasted in steri cycle with Bethann Humble, RN.

## 2019-09-12 NOTE — Progress Notes (Addendum)
PULMONARY / CRITICAL CARE MEDICINE   NAME:  [name], MRN:  [id number], DOB:  09-27-1950, LOS: 6 ADMISSION DATE:  09/06/2019, CONSULTATION DATE:  1/25 REFERRING MD:  [facility]  CHIEF COMPLAINT:  resp failure   BRIEF HISTORY:     39 yowf remote smoker carries dx of COPD/AB on pred ? How long and saba admitted with progressive wt gain, leg swelling sob with w/u c/w anasarca / bilateral effusions  more sob 1/23 and required intubation / mech with CTa neg PE, positive for small to moderate bilateral pleural effusions with small lung voumes and PCCM asked so see 1/25    HISTORY OF PRESENT ILLNESS   No direct hx availabe but from H/P:  69 y.o. female   HTN, DM (on trulicity), hyperlipidemia, diastolic heart failure, schizoaffective D/O with episodes of catatonia and sz d/o- on primidone/cymbalta and risperdone. She also seems to have CKD- crt in the low to mid 2's in early 2020- then a crt in the system from October 2020 that was 3, followed by [name] as her neurologist who does not believe the patient has frank seizures --Presented to the ED with worsening shortness of breath, worsening lower extremity edema and decreased urine output despite reported  compliance with diuretics at home -- In the ED chest x-rays consistent with CHF -Creatinine admit  up to 6.34 creatinine was 3.1 on 06/11/2019 and creatinine was 2.1 on 10/09/2018 SIGNIFICANT PAST MEDICAL HISTORY    . Anxiety   . Asthma   . Back pain   . Barrett esophagus   . CHF (congestive heart failure) ([place])   . CKD (chronic kidney disease) stage 3, GFR 30-59 ml/min   . COPD (chronic obstructive pulmonary disease) ([place])   . Depression   . Diastolic heart failure ([place])   . Essential hypertension   . Fibromyalgia   . GERD (gastroesophageal reflux disease)   . Gout   . History of pericarditis   . Hyperlipidemia   . Hypothyroid   . Major depressive disorder   . Memory loss   . Nephrolithiasis    2008   . Obesity hypoventilation syndrome (H[facility])    CPAP  . Obstructive sleep apnea   . PE (pulmonary embolism)    2010  . Peripheral neuropathy   . PTSD (post-traumatic stress disorder)   . Rheumatoid arthritis ([place])   . Schizophrenia ([place])   . 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 Steroid dependence ([place])   . Type 2 diabetes mellitus ([place])   . Vitamin D deficiency    . APPENDECTOMY    . CHOLECYSTECTOMY    . COLONOSCOPY     benign polyps (12/2000), repeat colonoscopy performed in 2007  . ENDOMETRIAL BIOPSY     10/03 benign columnar mucosa (limited material)  . KIDNEY SURGERY       SIGNIFICANT EVENTS:  Respiratory  1/23 4pm resp distress on floor, rapid response > intubated and transferred to ICU Started HD 1/26  STUDIES:   CTa  Chest 1/23 1. No CT evidence of acute pulmonary embolism. 2. Moderate severity left upper lobe and right lower lobe atelectasis and/or infiltrate with marked severity consolidation seen throughout the entire right lower lobe. 3. Moderate size bilateral pleural effusions, right greater than Left. CT Head 1/23 no acute cahnge CT head 1/26 (? L side neglect) > no acute changes   CULTURES:  Covid  1/21 neg MRSA pcr  1/23 neg   ANTIBIOTICS:  Levaquin 1/24 >>>  LINES/TUBES:  Oral  ET  1/23 L Fem HD 1/25    CONSULTANTS:  Neprology 1/21 PCCM 1/25   Scheduled Meds: . acetaminophen  325 mg Per NG tube BID  . aspirin  81 mg Per NG tube q morning - 10a  . atorvastatin  20 mg Per NG tube q1800  . calcitRIOL  0.25 mcg Per NG tube Q M,W,F  . chlorhexidine gluconate (MEDLINE KIT)  15 mL Mouth Rinse BID  . Chlorhexidine Gluconate Cloth  6 each Topical Daily  . Chlorhexidine Gluconate Cloth  6 each Topical Q0600  . cholecalciferol  4,000 Units Per NG tube q morning - 10a  . darbepoetin (ARANESP) injection - NON-DIALYSIS  150 mcg Subcutaneous Q Thu-1800  . feeding supplement (PRO-STAT SUGAR FREE 64)  30 mL Per Tube BID  .  free water  60 mL Per Tube Q4H  . furosemide  80 mg Intravenous Q12H  . heparin injection (subcutaneous)  5,000 Units Subcutaneous Q8H  . influenza vaccine adjuvanted  0.5 mL Intramuscular Tomorrow-1000  . insulin aspart  0-6 Units Subcutaneous Q4H  . insulin glargine  16 Units Subcutaneous QHS  . levothyroxine  25 mcg Per NG tube Q0600  . mouth rinse  15 mL Mouth Rinse 10 times per day  . metoprolol tartrate  12.5 mg Per Tube BID  . montelukast  10 mg Per Tube QHS  . multivitamin  15 mL Per Tube Daily  . neomycin-bacitracin-polymyxin  1 application Topical Daily  . pantoprazole (PROTONIX) IV  40 mg Intravenous Daily  . primidone  50 mg Per NG tube q morning - 10a  . pyridOXINE  200 mg Per NG tube BID  . risperiDONE  0.5 mg Per NG tube BID  . sodium chloride flush  3 mL Intravenous Q12H  . traZODone  50 mg Per NG tube Daily   Continuous Infusions: . sodium chloride    . sodium chloride    . sodium chloride    . feeding supplement (VITAL AF 1.2 CAL) 1,000 mL (09/12/19 1049)  . levofloxacin (LEVAQUIN) IV Stopped (09/11/19 1729)   PRN Meds:.sodium chloride, sodium chloride, sodium chloride, acetaminophen **OR** acetaminophen, albuterol, albuterol, alteplase, fentaNYL (SUBLIMAZE) injection, heparin, hydrOXYzine, labetalol, LORazepam, metoprolol tartrate, midazolam, ondansetron **OR** ondansetron (ZOFRAN) IV, polyethylene glycol, sodium chloride flush   SUBJECTIVE:  Much more responsive,squeezes eyes and lifts arms to request  CONSTITUTIONAL: BP (!) 148/66   Pulse 76   Temp 99.5 F (37.5 C) (Axillary)   Resp 19   Ht '5\' 2"'$  (1.575 m)   Wt 97.6 kg   SpO2 99%   BMI 39.35 kg/m   I/O last 3 completed shifts: In: 929.4 [I.V.:218.4; NG/GT:611; IV Piggyback:100] Out: 5051 [Urine:725; Emesis/NG output:100; Other:1000]     Vent Mode: PSV;CPAP FiO2 (%):  [35 %-40 %] 35 % Set Rate:  [20 bmp] 20 bmp Vt Set:  [400 mL] 400 mL PEEP:  [5 cmH20] 5 cmH20 Pressure Support:  [8 cmH20-10  cmH20] 10 cmH20 Plateau Pressure:  [17 cmH20-26 cmH20] 26 cmH20  PHYSICAL EXAM: General:  Obese wf nad on PSV Neuro:more muscle tone all 4 ext / f/c  HEENT: oral ET  Cardiovascular: RRR no s3/ trace pitting both LEs Lungs:distant bs/ no wheeze Abdomen: obese, soft with limited insp excursion  Skin: mild chronic venous changes both LEs       RESOLVED PROBLEM LIST   ASSESSMENT AND PLAN    1) Acute resp failure hypoxemic and hypercarbic present on admit  in setting of vol overload, acute  renal failure and anasarca/pleural effusions made worse by low albumin  - well compensated on present vent settings s air trapping but poor Clung due to body habitus and pleural effusions / no evidence of airflow obstruction despite previous use of saba and prednisone ? Why as she on pred previously?  >>> leave on vent until achieve improved vol overload but ok to try weaning some today for am 1/28 ? Extubate 1/28? To bipap? p check am cxr    2) Acute renal failure/ low albumin > bilateral pleural effusions. >>>  Better p just one session of HD >> rec keep as dry as tol to help weaning    3) Acute metabolic encephalopathy secondary to hypercarbia, present on admit. - was f/c 1/25 now ignoring L side > CT head still ok 1/26 and much more alert/ f/c pm 1/27  p held sedation/ dialyzed c/w TME not cva  4) Normocytic anemia typical of renal failure > rx per [facility] / renal when HD starts up   5) Plt trending down on Hep Lab Results  Component Value Date   PLT 126 (L) 09/10/2019   PLT 155 09/09/2019   PLT 171 09/08/2019    >>>>  continue to monitor/ check for HIT if continues down or any evidence thrombosis      Lab Results  Component Value Date   HGB 7.4 (L) 09/10/2019   HGB 7.0 (L) 09/09/2019   HGB 8.7 (L) 09/08/2019     SUMMARY OF TODAY'S PLAN:  Remain on vent until pull fluid off per HD one more session but ok to wean as tol   Best Practice / Goals of Care / Disposition.   DVT  PROPHYLAXIS:  Sub Q hep SUP: PPI NUTRITION:per [facility]  MOBILITY:as tol  GOALS OF CARE: per [facility]  FAMILY DISCUSSIONS: per [facility]  DISPOSITION remain ICU/ on Vent   LABS  Glucose Recent Labs  Lab 09/11/19 1553 09/11/19 2001 09/12/19 0058 09/12/19 0405 09/12/19 0803 09/12/19 1059  GLUCAP 100* 132* 111* 119* 137* 155*    BMET Recent Labs  Lab 09/10/19 0406 09/11/19 0412 09/12/19 0403  NA 139 141 138  K 3.7 3.6 3.3*  CL 102 105 102  CO2 '23 23 24  '$ BUN 69* 73* 58*  CREATININE 6.93* 7.50* 6.36*  GLUCOSE 129* 73 131*    Liver Enzymes Recent Labs  Lab 09/06/19 0343 09/07/19 0532 09/10/19 0406 09/11/19 0412 09/12/19 0403  AST 19  --   --   --   --   ALT 20  --   --   --   --   ALKPHOS 73  --   --   --   --   BILITOT 0.5  --   --   --   --   ALBUMIN 2.6*   < > 2.0* 2.0* 1.8*   < > = values in this interval not displayed.    Electrolytes Recent Labs  Lab 09/10/19 0406 09/11/19 0412 09/12/19 0403  CALCIUM 7.7* 7.7* 7.9*  MG 2.3  --   --   PHOS 6.5* 7.5* 6.5*    CBC Recent Labs  Lab 09/08/19 1544 09/09/19 0424 09/10/19 0406  WBC 6.7 9.0 10.2  HGB 8.7* 7.0* 7.4*  HCT 26.3* 20.9* 22.6*  PLT 171 155 126*    ABG Recent Labs  Lab 09/08/19 1755 09/09/19 0424 09/10/19 0409  PHART 7.363 7.420 7.412  PCO2ART 46.7 40.8 41.0  PO2ART 77.8* 65.6* 66.0*    Coag's Recent Labs  Lab 09/09/19 1224  INR 1.1    Sepsis Markers No results for input(s): LATICACIDVEN, PROCALCITON, O2SATVEN in the last 168 hours.  Cardiac Enzymes No results for input(s): TROPONINI, PROBNP in the last 168 hours.     The patient is critically ill with multiple organ systems failure and requires high complexity decision making for assessment and support, frequent evaluation and titration of therapies, application of advanced monitoring technologies and extensive interpretation of multiple databases. Critical Care Time devoted to patient care services described in this note is  40 minutes.     Christinia Gully, MD Pulmonary and [place] [phone] After 5:30 PM or weekends, use [facility] [phone]

## 2019-09-12 NOTE — Progress Notes (Signed)
Started weaning patient at this time on CP/PSV of 10/5. She is getting adequate Vts at this time and hemodynamically stable. RN notified to call RT if needed.

## 2019-09-13 ENCOUNTER — Inpatient Hospital Stay (HOSPITAL_COMMUNITY): Payer: Medicare Other

## 2019-09-13 DIAGNOSIS — G9341 Metabolic encephalopathy: Secondary | ICD-10-CM | POA: Diagnosis not present

## 2019-09-13 DIAGNOSIS — N189 Chronic kidney disease, unspecified: Secondary | ICD-10-CM | POA: Diagnosis not present

## 2019-09-13 DIAGNOSIS — N179 Acute kidney failure, unspecified: Secondary | ICD-10-CM | POA: Diagnosis not present

## 2019-09-13 DIAGNOSIS — R0602 Shortness of breath: Secondary | ICD-10-CM | POA: Diagnosis not present

## 2019-09-13 DIAGNOSIS — J810 Acute pulmonary edema: Secondary | ICD-10-CM

## 2019-09-13 DIAGNOSIS — J9601 Acute respiratory failure with hypoxia: Secondary | ICD-10-CM | POA: Diagnosis not present

## 2019-09-13 DIAGNOSIS — E8770 Fluid overload, unspecified: Secondary | ICD-10-CM | POA: Diagnosis not present

## 2019-09-13 DIAGNOSIS — E8779 Other fluid overload: Secondary | ICD-10-CM | POA: Diagnosis not present

## 2019-09-13 LAB — CBC
HCT: 25.1 % — ABNORMAL LOW (ref 36.0–46.0)
Hemoglobin: 8.2 g/dL — ABNORMAL LOW (ref 12.0–15.0)
MCH: 33.2 pg (ref 26.0–34.0)
MCHC: 32.7 g/dL (ref 30.0–36.0)
MCV: 101.6 fL — ABNORMAL HIGH (ref 80.0–100.0)
Platelets: 174 10*3/uL (ref 150–400)
RBC: 2.47 MIL/uL — ABNORMAL LOW (ref 3.87–5.11)
RDW: 14.3 % (ref 11.5–15.5)
WBC: 10.6 10*3/uL — ABNORMAL HIGH (ref 4.0–10.5)
nRBC: 0 % (ref 0.0–0.2)

## 2019-09-13 LAB — RENAL FUNCTION PANEL
Albumin: 2 g/dL — ABNORMAL LOW (ref 3.5–5.0)
Anion gap: 10 (ref 5–15)
BUN: 45 mg/dL — ABNORMAL HIGH (ref 8–23)
CO2: 27 mmol/L (ref 22–32)
Calcium: 8.1 mg/dL — ABNORMAL LOW (ref 8.9–10.3)
Chloride: 101 mmol/L (ref 98–111)
Creatinine, Ser: 4.61 mg/dL — ABNORMAL HIGH (ref 0.44–1.00)
GFR calc Af Amer: 11 mL/min — ABNORMAL LOW (ref >60)
GFR calc non Af Amer: 9 mL/min — ABNORMAL LOW (ref >60)
Glucose, Bld: 169 mg/dL — ABNORMAL HIGH (ref 70–99)
Phosphorus: 4.4 mg/dL (ref 2.5–4.6)
Potassium: 3.8 mmol/L (ref 3.5–5.1)
Sodium: 138 mmol/L (ref 135–145)

## 2019-09-13 LAB — GLUCOSE, CAPILLARY
Glucose-Capillary: 145 mg/dL — ABNORMAL HIGH (ref 70–99)
Glucose-Capillary: 151 mg/dL — ABNORMAL HIGH (ref 70–99)
Glucose-Capillary: 156 mg/dL — ABNORMAL HIGH (ref 70–99)
Glucose-Capillary: 160 mg/dL — ABNORMAL HIGH (ref 70–99)
Glucose-Capillary: 170 mg/dL — ABNORMAL HIGH (ref 70–99)
Glucose-Capillary: 191 mg/dL — ABNORMAL HIGH (ref 70–99)

## 2019-09-13 LAB — HEPATITIS B SURFACE ANTIBODY, QUANTITATIVE: Hep B S AB Quant (Post): <3.1 m[IU]/mL — ABNORMAL LOW (ref >9.9)

## 2019-09-13 MED ORDER — CHLORHEXIDINE GLUCONATE CLOTH 2 % EX PADS
6.0000 | MEDICATED_PAD | Freq: Once | CUTANEOUS | Status: AC
  Administered 2019-09-13: 6 via TOPICAL

## 2019-09-13 MED ORDER — HEPARIN SODIUM (PORCINE) 5000 UNIT/ML IJ SOLN
5000.0000 [IU] | Freq: Three times a day (TID) | INTRAMUSCULAR | Status: DC
  Administered 2019-09-14 – 2019-09-21 (×20): 5000 [IU] via SUBCUTANEOUS
  Filled 2019-09-13 (×20): qty 1

## 2019-09-13 MED ORDER — CHLORHEXIDINE GLUCONATE CLOTH 2 % EX PADS
6.0000 | MEDICATED_PAD | Freq: Once | CUTANEOUS | Status: AC
  Administered 2019-09-14: 6 via TOPICAL

## 2019-09-13 MED ORDER — VANCOMYCIN HCL IN DEXTROSE 1-5 GM/200ML-% IV SOLN
1000.0000 mg | INTRAVENOUS | Status: DC
  Filled 2019-09-13: qty 200

## 2019-09-13 MED FILL — Medication: Qty: 1 | Status: AC

## 2019-09-13 NOTE — Progress Notes (Addendum)
PROGRESS NOTE    [name]  KZS:[id number] DOB: 05/22/1951 DOA: 09/06/2019 PCP: Glenda Chroman, MD    Brief Narrative:  69 year old female with history of hypertension, diabetes, diastolic heart failure, schizoaffective disorder, chronic kidney disease stage IV, who presented to the emergency room with acute kidney injury on chronic kidney disease and creatinine of 6.3.  She was noted to be volume overload.  She is admitted to the hospital, started on intravenous Lasix and is being followed by nephrology.  She has not had any significant improvement in renal function and is felt that she will likely need hemodialysis.  Plans were to start hemodialysis on 1/25.  On 1/23, patient had a sudden change in mental status.  She had increased respiratory effort and blood gas showed respiratory acidosis.  Patient was transferred to the ICU and was intubated.  Further work-up for mental status change including CT head has been ordered. -Remains intubated  -Initiated on HD on 09/11/2019   Assessment & Plan:   Principal Problem:   Acute kidney injury superimposed on chronic kidney disease ([place]) Active Problems:   Volume overload   SVT (supraventricular tachycardia) (HCC)   Type 2 diabetes mellitus with diabetic polyneuropathy, without long-term current use of insulin (HCC)   Normocytic anemia   Schizophrenia (HCC)   Depression   Essential hypertension   Chronic diastolic heart failure (HCC)   CKD (chronic kidney disease), stage IV (HCC)   Obesity, Class III, BMI 40-49.9 (morbid obesity) ([place])   Type 2 diabetes mellitus ([place])   Hypothyroidism   On mechanically assisted ventilation (HCC)   Acute metabolic encephalopathy   Acute respiratory failure with hypoxia and hypercapnia (HCC)   Acute pulmonary edema (HCC)    1)Acute respiratory failure with hypoxia and hypercapnia--multifactorial etiology including volume overload in the setting of ESRD-Intubated 09/08/2019  -Did well with CPAP trial  again today--per weaning protocol -PCCM consult appreciated -Weaned off fentanyl drip, not really using as needed IV fentanyl much  -Keep intubated until volume status improves with HD -Continue Levaquin -CTA chest without PE, has bilateral pleural effusions -Initiated on hemodialysis on 09/11/2019 -Possible extubation on 09/14/2019 after placement of tunneled HD catheter  2)AKI----acute kidney injury on CKD stage -IV Versus Progression towards CKD 5,GFR is now less than 10, -had uremic symptoms prior to intubation --Elevated creatinine noted, creatinine was 3.1 on 06/11/2019 and creatinine was 2.1 on 10/09/2018 -renal ultrasound without obstructive uropathy there is evidence of bilateral medical renal disease -Nephrology consult appreciated - patient was agreeable to undergo hemodialysis prior to intubation -Stop PTA torsemide, Failed on high-dose IV Lasix, continues on Lasix 80 mg twice daily -renally adjust medications, avoid nephrotoxic agents / dehydration / hypotension -PTH -263 -Had first hemodialysis session on 09/11/2019, again on 09/12/2019, next HD scheduled for 09/13/2019  3) PSVT----had episode of paroxysmal SVT on 09/10/2019 responded well to IV adenosine, continue metoprolol as ordered  4)HFpEF--last known EF 60 to 65% based on echo from January 2020, patient had diastolic dysfunction -Continue IV Lasix and HD sessions to address volume status -Metoprolol as ordered  5)HTN-stage II--amlodipine discontinued due to edema and to allow room to titrate diuretics, discontinue prazosin,  -Metoprolol as ordered  6)DM2with peripheral neuropathy--A1c is 5.2 reflecting excellent diabetic control PTA -PTA patient was on Tradjenta,Trulicity discontinued, and Lantus insulin  -Allow some permissive Hyperglycemia rather than risk life-threatening hypoglycemia in a patient with unreliable oral intake. Use Novolog/Humalog Sliding scale insulin with Accu-Cheks/Fingersticks as  ordered  7)Hypothyroidism--continue levothyroxine 25 mcg daily,TSH was  2.9 in April 2020,  8))Neuropsychiatric disorders---underlying schizoaffective disorder, as well as questionable seizure attacks in the past, followed by [name] as her neurologist,who does not believe the patient has genuine seizures---PTA was on Lamictal for mood stabilization, trazodone 50 mg nightly,,Risperdal 0.5 mg twice daily, vitamin B6, Mysoline 50 mg daily, and as needed lorazepam  7)Morbid obesity/OSA---PTA was on CPAP nightly , currently intubated  8)Anemia of CKD---ESA/EPO agent per nephrology team, ferritin 19 and TIBC 241 and serum iron is 62,  9)FEN--vital 1.2 tube feeding at 40 mL an hour  with water flushes through OG tube, Accu-Cheks as ordered  Disposition/Need for in-Hospital Stay- -currently intubated for hypoxic and hypercapnic respiratory failure -Started on hemodialysis this admission, will  Need clipping process prior to discharge -Possible extubation on 09/14/2019 after placement of tunneled HD catheter  Code Status : Full  Family Communication:   na  Consults  :  Neprhology/general surgery/PCCM  DVT Prophylaxis  :   - Heparin - SCDs CRITICAL CARE Performed by: Roxan Hockey   Total critical care time: 42 minutes  Critical care time was exclusive of separately billable procedures and treating other patients.  Critical care was necessary to treat or prevent imminent or life-threatening deterioration. -Did well with CPAP trial and weaning protocol  -To remain intubated -Possible extubation on 09/14/2019 after placement of tunneled HD catheter  Critical care was time spent personally by me on the following activities: development of treatment plan with patient and/or surrogate as well as nursing, discussions with consultants, evaluation of patient's response to treatment, examination of patient, obtaining history from patient or surrogate, ordering and  performing treatments and interventions, ordering and review of laboratory studies, ordering and review of radiographic studies, pulse oximetry and re-evaluation of patient's condition.   DVT prophylaxis: SCDs Code Status: Full code Family Communication: Patient is a resident of a group home.  She does not have any family.  Contacted her emergency contact, [name] who is the administrator at the facility.  She said that patient does not have any designated power of attorney.  Disposition Plan: discharge home once renal and pulmonary status has stabilized -Clipping process started   Consultants:   Nephrology/PCCM/Gen Surgery  Procedures:   ETT 1/23> Temporary Dialysis line placement, left femoral under ultrasound guidance    Antimicrobials:   Levaquin 1/24>   Subjective:  -Tolerating hemodialysis well  -Tolerating tube feeding well -Off fentanyl drip  Objective: Vitals:   09/13/19 1515 09/13/19 1533 09/13/19 1553 09/13/19 1600  BP:    (!) 157/65  Pulse: 82  83 85  Resp: 18  (!) 21 (!) 21  Temp:  99.1 F (37.3 C)    TempSrc:  Axillary    SpO2: 95%  97% 97%  Weight:      Height:        Intake/Output Summary (Last 24 hours) at 09/13/2019 1656 Last data filed at 09/13/2019 1642 Gross per 24 hour  Intake 100 ml  Output 3150 ml  Net -3050 ml   Filed Weights   09/12/19 0500 09/12/19 2140 09/13/19 0500  Weight: 97.6 kg 97.9 kg 96.5 kg    Examination:  General exam: intubated on ventilator, more awake HEENT-ET tube and OG tube Respiratory system: diminished breath sounds on right, crackles at bases.  . Cardiovascular system:RRR. No murmurs, rubs, gallops. Gastrointestinal system: Abdomen is nondistended, soft and   Normal bowel sounds heard. Central nervous system: .  Intubated, ?? Rt sided weakness Extremities: 2+ edema bilaterally upper and  lower extremities Skin: No rashes, lesions or ulcers Psychiatry: unable to assess since she is on a vent MSK-left  femoral HD catheter GU-Foley with scant amount of urine   Data Reviewed: I have personally reviewed following labs and imaging studies  CBC: Recent Labs  Lab 09/07/19 0532 09/08/19 1544 09/09/19 0424 09/10/19 0406 09/13/19 0341  WBC 6.7 6.7 9.0 10.2 10.6*  HGB 7.2* 8.7* 7.0* 7.4* 8.2*  HCT 22.1* 26.3* 20.9* 22.6* 25.1*  MCV 100.5* 100.4* 97.7 98.7 101.6*  PLT 159 171 155 126* 465   Basic Metabolic Panel: Recent Labs  Lab 09/09/19 0424 09/10/19 0406 09/11/19 0412 09/12/19 0403 09/13/19 0341  NA 138 139 141 138 138  K 3.2* 3.7 3.6 3.3* 3.8  CL 101 102 105 102 101  CO2 '25 23 23 24 27  '$ GLUCOSE 100* 129* 73 131* 169*  BUN 70* 69* 73* 58* 45*  CREATININE 6.42* 6.93* 7.50* 6.36* 4.61*  CALCIUM 8.1* 7.7* 7.7* 7.9* 8.1*  MG  --  2.3  --   --   --   PHOS 6.4* 6.5* 7.5* 6.5* 4.4   GFR: Estimated Creatinine Clearance: 12.8 mL/min (A) (by C-G formula based on SCr of 4.61 mg/dL (H)). Liver Function Tests: Recent Labs  Lab 09/09/19 0424 09/10/19 0406 09/11/19 0412 09/12/19 0403 09/13/19 0341  ALBUMIN 2.3* 2.0* 2.0* 1.8* 2.0*   No results for input(s): LIPASE, AMYLASE in the last 168 hours. No results for input(s): AMMONIA in the last 168 hours. Coagulation Profile: Recent Labs  Lab 09/09/19 1224  INR 1.1   Cardiac Enzymes: No results for input(s): CKTOTAL, CKMB, CKMBINDEX, TROPONINI in the last 168 hours. BNP (last 3 results) No results for input(s): PROBNP in the last 8760 hours. HbA1C: No results for input(s): HGBA1C in the last 72 hours. CBG: Recent Labs  Lab 09/13/19 0041 09/13/19 0502 09/13/19 0726 09/13/19 1119 09/13/19 1521  GLUCAP 145* 156* 160* 191* 170*   Lipid Profile: No results for input(s): CHOL, HDL, LDLCALC, TRIG, CHOLHDL, LDLDIRECT in the last 72 hours. Thyroid Function Tests: No results for input(s): TSH, T4TOTAL, FREET4, T3FREE, THYROIDAB in the last 72 hours. Anemia Panel: No results for input(s): VITAMINB12, FOLATE, FERRITIN, TIBC,  IRON, RETICCTPCT in the last 72 hours. Sepsis Labs: No results for input(s): PROCALCITON, LATICACIDVEN in the last 168 hours.  Recent Results (from the past 240 hour(s))  SARS CORONAVIRUS 2 (TAT 6-24 HRS) Nasopharyngeal Nasopharyngeal Swab     Status: None   Collection Time: 09/06/19  3:43 AM   Specimen: Nasopharyngeal Swab  Result Value Ref Range Status   SARS Coronavirus 2 NEGATIVE NEGATIVE Final    Comment: (NOTE) SARS-CoV-2 target nucleic acids are NOT DETECTED. The SARS-CoV-2 RNA is generally detectable in upper and lower respiratory specimens during the acute phase of infection. Negative results do not preclude SARS-CoV-2 infection, do not rule out co-infections with other pathogens, and should not be used as the sole basis for treatment or other patient management decisions. Negative results must be combined with clinical observations, patient history, and epidemiological information. The expected result is Negative. Fact Sheet for Patients: SugarRoll.be Fact Sheet for Healthcare Providers: https://www.woods-mathews.com/ This test is not yet approved or cleared by the [place] FDA and  has been authorized for detection and/or diagnosis of SARS-CoV-2 by FDA under an Emergency Use Authorization (EUA). This EUA will remain  in effect (meaning this test can be used) for the duration of the COVID-19 declaration under Section 56 4(b)(1) of the Act, 21 U.S.C.  section 360bbb-3(b)(1), unless the authorization is terminated or revoked sooner. Performed at [facility], [place] [address], [place], [place] [address]   MRSA PCR Screening     Status: None   Collection Time: 09/08/19 12:30 PM   Specimen: Nasal Mucosa; Nasopharyngeal  Result Value Ref Range Status   MRSA by PCR NEGATIVE NEGATIVE Final    Comment:        The GeneXpert MRSA Assay (FDA approved for NASAL specimens only), is one component of a comprehensive MRSA  colonization surveillance program. It is not intended to diagnose MRSA infection nor to guide or monitor treatment for MRSA infections. Performed at [facility], [address] [place], [place] [address]          Radiology Studies: [facility] Chest [facility] 1 View  Result Date: 09/13/2019 CLINICAL DATA:  Respiratory failure with hypoxemia. EXAM: PORTABLE CHEST 1 VIEW COMPARISON:  Chest radiograph 09/10/2019, chest CT 09/08/2019 FINDINGS: ET tube is unchanged position with tip terminating at the level of the clavicular heads. An enteric tube passes below level of left hemidiaphragm with tip excluded from the field of view. Overlying cardiac monitoring leads. Unchanged cardiomediastinal silhouette with cardiomegaly. Right greater than left pleural effusions and bibasilar atelectasis, similar to prior examination. No evidence of pneumothorax. IMPRESSION: Support apparatus as described. Similar appearance of right greater than left pleural effusions and bibasilar atelectasis. Pneumonia at the lung bases cannot be excluded. Unchanged cardiomegaly. Electronically Signed   By: Kellie Simmering DO   On: 09/13/2019 07:13   Scheduled Meds: . acetaminophen  325 mg Per NG tube BID  . aspirin  81 mg Per NG tube q morning - 10a  . atorvastatin  20 mg Per NG tube q1800  . calcitRIOL  0.25 mcg Per NG tube Q M,W,F  . chlorhexidine gluconate (MEDLINE KIT)  15 mL Mouth Rinse BID  . Chlorhexidine Gluconate Cloth  6 each Topical Daily  . Chlorhexidine Gluconate Cloth  6 each Topical Q0600  . Chlorhexidine Gluconate Cloth  6 each Topical Once   And  . Chlorhexidine Gluconate Cloth  6 each Topical Once  . cholecalciferol  4,000 Units Per NG tube q morning - 10a  . darbepoetin (ARANESP) injection - NON-DIALYSIS  150 mcg Subcutaneous Q Thu-1800  . feeding supplement (PRO-STAT SUGAR FREE 64)  30 mL Per Tube BID  . free water  60 mL Per Tube Q4H  . furosemide  80 mg Intravenous Q12H  . heparin injection (subcutaneous)   5,000 Units Subcutaneous Q8H  . influenza vaccine adjuvanted  0.5 mL Intramuscular Tomorrow-1000  . insulin aspart  0-6 Units Subcutaneous Q4H  . insulin glargine  16 Units Subcutaneous QHS  . levothyroxine  25 mcg Per NG tube Q0600  . mouth rinse  15 mL Mouth Rinse 10 times per day  . metoprolol tartrate  12.5 mg Per Tube BID  . montelukast  10 mg Per Tube QHS  . multivitamin  15 mL Per Tube Daily  . neomycin-bacitracin-polymyxin  1 application Topical Daily  . pantoprazole (PROTONIX) IV  40 mg Intravenous Daily  . primidone  50 mg Per NG tube q morning - 10a  . pyridOXINE  200 mg Per NG tube BID  . risperiDONE  0.5 mg Per NG tube BID  . sodium chloride flush  3 mL Intravenous Q12H  . traZODone  50 mg Per NG tube Daily   Continuous Infusions: . sodium chloride    . sodium chloride    . sodium  chloride    . feeding supplement (VITAL AF 1.2 CAL) 1,000 mL (09/13/19 1112)  . levofloxacin (LEVAQUIN) IV 500 mg (09/13/19 0944)  . [START ON 09/14/2019] vancomycin       LOS: 7 days   Roxan Hockey, MD [facility]  If 7PM-7AM, please contact night-coverage [email or web address]  09/13/2019, 4:56 PM

## 2019-09-13 NOTE — Progress Notes (Signed)
Pt informed consent signed for HD catheter placement for 1/29 by the three attending physicians due to patient being unable to sign and having no family to contact. Attending RN also witnessed informed consent being obtained by MD's and signed as a witness. Pt pre- procedure checklist started. Pt tube feeding to be turned off at 2359 tonight. Will pass on to night shift RN. Will continue to monitor.

## 2019-09-13 NOTE — Progress Notes (Signed)
Patient ID: [name], female   DOB: 1951-02-17, 69 y.o.   MRN: [id number] S: More awake and alert this morning O:BP 134/67   Pulse 78   Temp 99.6 F (37.6 C) (Axillary)   Resp (!) 21   Ht _0  (1.575 m)   Wt 96.5 kg   SpO2 97%   BMI 38.91 kg/m   Intake/Output Summary (Last 24 hours) at 09/13/2019 1110 Last data filed at 09/13/2019 0700 Gross per 24 hour  Intake 424 ml  Output 2950 ml  Net -2526 ml   Intake/Output: I/O last 3 completed shifts: In: 795.3 [NG/GT:795.3] Out: 3950 [Urine:950; Other:3000]  Intake/Output this shift:  No intake/output data recorded. Weight change: -2.1 kg Gen: intubated but awake and responsive CVS: no rub Resp: cta  Abd:+BS, soft, NT/ND Ext:1+ edema  Recent Labs  Lab 09/07/19 0532 09/07/19 0532 09/08/19 0525 09/08/19 1544 09/09/19 0424 09/10/19 0406 09/11/19 0412 09/12/19 0403 09/13/19 0341  NA 140   < > 138 138 138 139 141 138 138  K 3.6   < > 3.5 4.3 3.2* 3.7 3.6 3.3* 3.8  CL 101   < > 99 99 101 102 105 102 101  CO2 27   < > _1 GLUCOSE 104*   < > 110* 223* 100* 129* 73 131* 169*  BUN 64*   < > 68* 70* 70* 69* 73* 58* 45*  CREATININE 6.47*   < > 6.26* 6.75* 6.42* 6.93* 7.50* 6.36* 4.61*  ALBUMIN 2.5*  --  2.5*  --  2.3* 2.0* 2.0* 1.8* 2.0*  CALCIUM 7.9*   < > 8.0* 8.2* 8.1* 7.7* 7.7* 7.9* 8.1*  PHOS 6.9*  --  6.8*  --  6.4* 6.5* 7.5* 6.5* 4.4   < > = values in this interval not displayed.   Liver Function Tests: Recent Labs  Lab 09/11/19 0412 09/12/19 0403 09/13/19 0341  ALBUMIN 2.0* 1.8* 2.0*   No results for input(s): LIPASE, AMYLASE in the last 168 hours. No results for input(s): AMMONIA in the last 168 hours. CBC: Recent Labs  Lab 09/07/19 0532 09/07/19 0532 09/08/19 1544 09/08/19 1544 09/09/19 0424 09/10/19 0406 09/13/19 0341  WBC 6.7   < > 6.7   < > 9.0 10.2 10.6*  HGB 7.2*   < > 8.7*   < > 7.0* 7.4* 8.2*  HCT 22.1*   < > 26.3*   < > 20.9* 22.6* 25.1*  MCV 100.5*  --  100.4*  --   97.7 98.7 101.6*  PLT 159   < > 171   < > 155 126* 174   < > = values in this interval not displayed.   Cardiac Enzymes: No results for input(s): CKTOTAL, CKMB, CKMBINDEX, TROPONINI in the last 168 hours. CBG: Recent Labs  Lab 09/12/19 1528 09/12/19 1935 09/13/19 0041 09/13/19 0502 09/13/19 0726  GLUCAP 171* 165* 145* 156* 160*    Iron Studies: No results for input(s): IRON, TIBC, TRANSFERRIN, FERRITIN in the last 72 hours. Studies/Results: CT HEAD WO CONTRAST  Result Date: 09/11/2019 CLINICAL DATA:  Onset left-side neglect today. EXAM: CT HEAD WITHOUT CONTRAST TECHNIQUE: Contiguous axial images were obtained from the base of the skull through the vertex without intravenous contrast. COMPARISON:  Head CT 09/08/2019.  Brain MRI 09/27/2018. FINDINGS: Brain: No evidence of acute infarction, hemorrhage, hydrocephalus, extra-axial collection or mass lesion/mass effect. Vascular: No hyperdense vessel.  Atherosclerosis noted. Skull: Intact.  No focal lesion. Sinuses/Orbits: Small right mastoid effusion  is seen. Mucous retention cyst or polyp left maxillary sinus noted. Mild mucosal thickening scattered ethmoid air cells, sphenoid sinuses and right maxillary sinus also noted. Other: None. IMPRESSION: No acute abnormality finding to explain the patient's symptoms. Atherosclerosis. Mild sinus disease. Electronically Signed   By: Inge Rise M.D.   On: 09/11/2019 15:41   DG Chest Port 1 View  Result Date: 09/13/2019 CLINICAL DATA:  Respiratory failure with hypoxemia. EXAM: PORTABLE CHEST 1 VIEW COMPARISON:  Chest radiograph 09/10/2019, chest CT 09/08/2019 FINDINGS: ET tube is unchanged position with tip terminating at the level of the clavicular heads. An enteric tube passes below level of left hemidiaphragm with tip excluded from the field of view. Overlying cardiac monitoring leads. Unchanged cardiomediastinal silhouette with cardiomegaly. Right greater than left pleural effusions and bibasilar  atelectasis, similar to prior examination. No evidence of pneumothorax. IMPRESSION: Support apparatus as described. Similar appearance of right greater than left pleural effusions and bibasilar atelectasis. Pneumonia at the lung bases cannot be excluded. Unchanged cardiomegaly. Electronically Signed   By: Kellie Simmering DO   On: 09/13/2019 07:13   . acetaminophen  325 mg Per NG tube BID  . aspirin  81 mg Per NG tube q morning - 10a  . atorvastatin  20 mg Per NG tube q1800  . calcitRIOL  0.25 mcg Per NG tube Q M,W,F  . chlorhexidine gluconate (MEDLINE KIT)  15 mL Mouth Rinse BID  . Chlorhexidine Gluconate Cloth  6 each Topical Daily  . Chlorhexidine Gluconate Cloth  6 each Topical Q0600  . cholecalciferol  4,000 Units Per NG tube q morning - 10a  . darbepoetin (ARANESP) injection - NON-DIALYSIS  150 mcg Subcutaneous Q Thu-1800  . feeding supplement (PRO-STAT SUGAR FREE 64)  30 mL Per Tube BID  . free water  60 mL Per Tube Q4H  . furosemide  80 mg Intravenous Q12H  . heparin injection (subcutaneous)  5,000 Units Subcutaneous Q8H  . influenza vaccine adjuvanted  0.5 mL Intramuscular Tomorrow-1000  . insulin aspart  0-6 Units Subcutaneous Q4H  . insulin glargine  16 Units Subcutaneous QHS  . levothyroxine  25 mcg Per NG tube Q0600  . mouth rinse  15 mL Mouth Rinse 10 times per day  . metoprolol tartrate  12.5 mg Per Tube BID  . montelukast  10 mg Per Tube QHS  . multivitamin  15 mL Per Tube Daily  . neomycin-bacitracin-polymyxin  1 application Topical Daily  . pantoprazole (PROTONIX) IV  40 mg Intravenous Daily  . primidone  50 mg Per NG tube q morning - 10a  . pyridOXINE  200 mg Per NG tube BID  . risperiDONE  0.5 mg Per NG tube BID  . sodium chloride flush  3 mL Intravenous Q12H  . traZODone  50 mg Per NG tube Daily    BMET    Component Value Date/Time   NA 138 09/13/2019 0341   K 3.8 09/13/2019 0341   CL 101 09/13/2019 0341   CO2 27 09/13/2019 0341   GLUCOSE 169 (H) 09/13/2019  0341   BUN 45 (H) 09/13/2019 0341   CREATININE 4.61 (H) 09/13/2019 0341   CREATININE 2.54 (H) 08/02/2018 1411   CALCIUM 8.1 (L) 09/13/2019 0341   GFRNONAA 9 (L) 09/13/2019 0341   GFRAA 11 (L) 09/13/2019 0341   CBC    Component Value Date/Time   WBC 10.6 (H) 09/13/2019 0341   RBC 2.47 (L) 09/13/2019 0341   HGB 8.2 (L) 09/13/2019 0341   HCT  25.1 (L) 09/13/2019 0341   PLT 174 09/13/2019 0341   MCV 101.6 (H) 09/13/2019 0341   MCH 33.2 09/13/2019 0341   MCHC 32.7 09/13/2019 0341   RDW 14.3 09/13/2019 0341   LYMPHSABS 0.5 (L) 09/06/2019 0343   MONOABS 0.3 09/06/2019 0343   EOSABS 0.0 09/06/2019 0343   BASOSABS 0.0 09/06/2019 0343    Assessment/Plan:  1. AKI/subacute kidney injury vs progressive CKD - underlying CKD due to DM and followed by Dr. Lowanda Foster. She is volume overloaded and BUN/Cr climbing with diuresis. 1. S/p left femoral HD catheter 09/10/19 2. Initiated serial HD on 09/11/19. 3. Plan for third HD today and UF as tolerated then on Saturday 09/15/19 for first full tx. 4. Will need SW assistance in arranging for outpatient HD  2. Acute hypoxic respiratory failure- acute decompensation on 09/08/19. She was subsequently intubated and transferred to the ICU. CT angio negative for PE. Plan to attempt UF with HD as toleratedand PCCM to reassess extubation after volume removal. 3. Vascular access-temporary left femoral HD catheter placed 09/10/19 as she was too unstable for [facility] with tachycardia and hypotension. Appreciate assistance of Dr. Constance Haw. 1. Plan for [facility] placement tomorrow 2. Will need vascular surgery consult for AVF/AVG placement 4. AMS- CT scan negative for acute changes.  Eyes open but not following commands 5. Acute on chronic diastolic CHF- volume overloaded and will UF as tolerated 6. Anemia of CKD- on ESA. Transfuse prn. 7. Morbid obesity/OSA- intubated 8. Tachycardia-improved with cardizem/metoprolol.  Donetta Potts, MD [employer] [phone]

## 2019-09-13 NOTE — Consult Note (Signed)
[facility] Consult  Reason for Consult: Dialysis access  Referring Physician:  Dr. Denton Brick Dr. Marval Regal   Chief Complaint    Shortness of Breath      HPI: [name] is a 69 y.o. female with worsening acute on chronic renal failure now requiring dialysis via a temporary femoral catheter I placed Monday. The patient came into the hospital with volume overload and worsening renal failure, and did not respond to lasix. She has since been intubated and had issues with fluid overload including SVT. She has now been dialyzed 2 times and is getting her third treatment today. She has no family and lives at a facility. She has no guardian. She is unable to consent at this time. She expressed to the nephrologist that she wanted to receive dialysis prior to intubation.   Past Medical History:  Diagnosis Date  . Anxiety   . Asthma   . Back pain   . Barrett esophagus   . CHF (congestive heart failure) ([place])   . CKD (chronic kidney disease) stage 3, GFR 30-59 ml/min   . COPD (chronic obstructive pulmonary disease) ([place])   . Depression   . Diastolic heart failure ([place])   . Essential hypertension   . Fibromyalgia   . GERD (gastroesophageal reflux disease)   . Gout   . History of pericarditis   . Hyperlipidemia   . Hypothyroid   . Major depressive disorder   . Memory loss   . Nephrolithiasis    2008  . Obesity hypoventilation syndrome (HCC)    CPAP  . Obstructive sleep apnea   . PE (pulmonary embolism)    2010  . Peripheral neuropathy   . PTSD (post-traumatic stress disorder)   . Rheumatoid arthritis ([place])   . Schizophrenia ([place])   . 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 Steroid dependence ([place])   . Type 2 diabetes mellitus ([place])   . Vitamin D deficiency     Past Surgical History:  Procedure Laterality Date  . APPENDECTOMY    . CHOLECYSTECTOMY    . COLONOSCOPY     benign polyps (12/2000), repeat colonoscopy performed in 2007  . ENDOMETRIAL  BIOPSY     10/03 benign columnar mucosa (limited material)  . KIDNEY SURGERY      Family History  Problem Relation Age of Onset  . Bone cancer Mother   . Hypertension Mother   . Heart disease Mother   . COPD Father   . Heart disease Father   . Stroke Father     Social History   Tobacco Use  . Smoking status: Former Smoker    Types: Cigarettes    Quit date: 06/23/1972    Years since quitting: 47.2  . Smokeless tobacco: Never Used  Substance Use Topics  . Alcohol use: No  . Drug use: No    Medications: I have reviewed the patient's current medications.  Allergies  Allergen Reactions  . Benztropine Mesylate Other (See Comments)    Alopecia and white spots on eyes  . Codeine Swelling, Palpitations and Other (See Comments)    Mouth Swelling and Tachycardia   . Diazepam Other (See Comments)    COMA  . Diphenhydramine Hcl Anxiety, Palpitations and Other (See Comments)    Tachycardia and anxiousness  . Penicillins Swelling and Other (See Comments)    REACTION: ulcers in mouth, swelling, throat swelling  Has patient had a PCN reaction causing immediate rash, facial/tongue/throat swelling, SOB or lightheadedness with hypotension: YES Has patient had  a PCN reaction causing severe rash involving Mucus membranes or skin necrosis: Unknown Has patient had a PCN reaction that required hospitalization Unknown Has patient had a PCN reaction occurring within the last 10 years: unknown If all of the above answers are "NO", then may proceed with Cephalospori  . Sulfonamide Derivatives Rash    Blisters, also  . Thioridazine Hcl Swelling  . Lisinopril Cough     ROS:  Review of systems not obtained due to patient factors.  Blood pressure (!) 145/74, pulse 83, temperature 99.5 F (37.5 C), temperature source Axillary, resp. rate (!) 25, height 5\' 2"  (1.575 m), weight 96.5 kg, SpO2 99 %. Physical Exam Vitals reviewed.  Constitutional:      Appearance: She is obese.  HENT:      Head: Normocephalic.  Eyes:     Extraocular Movements: Extraocular movements intact.  Cardiovascular:     Rate and Rhythm: Normal rate.  Pulmonary:     Effort: Pulmonary effort is normal.  Abdominal:     Palpations: Abdomen is soft.     Tenderness: There is no abdominal tenderness.  Musculoskeletal:     Right lower leg: Edema present.     Left lower leg: Edema present.  Skin:    General: Skin is warm.  Neurological:     Mental Status: She is alert.     Cranial Nerves: Cranial nerves are intact.     Comments: Eyes open, does not follow commands, tracks some with eyes, moving upper bilateral     Results: Results for orders placed or performed during the hospital encounter of 09/06/19 (from the past 48 hour(s))  Glucose, capillary     Status: Abnormal   Collection Time: 09/11/19  3:53 PM  Result Value Ref Range   Glucose-Capillary 100 (H) 70 - 99 mg/dL  Glucose, capillary     Status: Abnormal   Collection Time: 09/11/19  8:01 PM  Result Value Ref Range   Glucose-Capillary 132 (H) 70 - 99 mg/dL   Comment 1 Notify RN    Comment 2 Document in Chart   Hepatitis B surface antigen     Status: None   Collection Time: 09/11/19 11:07 PM  Result Value Ref Range   Hepatitis B Surface Ag NON REACTIVE NON REACTIVE    Comment: Performed at [facility] [address] [place], [place] [address]  Hepatitis B surface antibody     Status: Abnormal   Collection Time: 09/11/19 11:07 PM  Result Value Ref Range   Hepatitis B-Post <3.1 (L) Immunity>9.9 mIU/mL    Comment: (NOTE)  Status of Immunity                     Anti-HBs Level  ------------------                     -------------- Inconsistent with Immunity                   0.0 - 9.9 Consistent with Immunity                          >9.9 Performed At: [facility] [address] [place], [place] [id number] Rush Farmer MD [id number]   Hepatitis B core antibody, total     Status: None   Collection Time: 09/11/19  11:07 PM  Result Value Ref Range   Hep B Core Total Ab NON REACTIVE NON REACTIVE  Comment: Performed at [facility], [place] [address], [place], [place] [address]  Glucose, capillary     Status: Abnormal   Collection Time: 09/12/19 12:58 AM  Result Value Ref Range   Glucose-Capillary 111 (H) 70 - 99 mg/dL   Comment 1 Notify RN    Comment 2 Document in Chart   Renal function panel     Status: Abnormal   Collection Time: 09/12/19  4:03 AM  Result Value Ref Range   Sodium 138 135 - 145 mmol/L   Potassium 3.3 (L) 3.5 - 5.1 mmol/L   Chloride 102 98 - 111 mmol/L   CO2 24 22 - 32 mmol/L   Glucose, Bld 131 (H) 70 - 99 mg/dL   BUN 58 (H) 8 - 23 mg/dL   Creatinine, Ser 6.36 (H) 0.44 - 1.00 mg/dL   Calcium 7.9 (L) 8.9 - 10.3 mg/dL   Phosphorus 6.5 (H) 2.5 - 4.6 mg/dL   Albumin 1.8 (L) 3.5 - 5.0 g/dL   GFR calc non Af Amer 6 (L) >60 mL/min   GFR calc Af Amer 7 (L) >60 mL/min   Anion gap 12 5 - 15    Comment: Performed at [facility], [address], [place], [place] [address]  Glucose, capillary     Status: Abnormal   Collection Time: 09/12/19  4:05 AM  Result Value Ref Range   Glucose-Capillary 119 (H) 70 - 99 mg/dL   Comment 1 Notify RN    Comment 2 Document in Chart   Glucose, capillary     Status: Abnormal   Collection Time: 09/12/19  8:03 AM  Result Value Ref Range   Glucose-Capillary 137 (H) 70 - 99 mg/dL   Comment 1 Notify RN   Glucose, capillary     Status: Abnormal   Collection Time: 09/12/19 10:59 AM  Result Value Ref Range   Glucose-Capillary 155 (H) 70 - 99 mg/dL  Glucose, capillary     Status: Abnormal   Collection Time: 09/12/19  3:28 PM  Result Value Ref Range   Glucose-Capillary 171 (H) 70 - 99 mg/dL  Glucose, capillary     Status: Abnormal   Collection Time: 09/12/19  7:35 PM  Result Value Ref Range   Glucose-Capillary 165 (H) 70 - 99 mg/dL  Glucose, capillary     Status: Abnormal   Collection Time: 09/13/19 12:41 AM  Result Value Ref Range    Glucose-Capillary 145 (H) 70 - 99 mg/dL  Renal function panel     Status: Abnormal   Collection Time: 09/13/19  3:41 AM  Result Value Ref Range   Sodium 138 135 - 145 mmol/L   Potassium 3.8 3.5 - 5.1 mmol/L   Chloride 101 98 - 111 mmol/L   CO2 27 22 - 32 mmol/L   Glucose, Bld 169 (H) 70 - 99 mg/dL   BUN 45 (H) 8 - 23 mg/dL   Creatinine, Ser 4.61 (H) 0.44 - 1.00 mg/dL   Calcium 8.1 (L) 8.9 - 10.3 mg/dL   Phosphorus 4.4 2.5 - 4.6 mg/dL   Albumin 2.0 (L) 3.5 - 5.0 g/dL   GFR calc non Af Amer 9 (L) >60 mL/min   GFR calc Af Amer 11 (L) >60 mL/min   Anion gap 10 5 - 15    Comment: Performed at [facility], [address] [place], [place] [address]  CBC     Status: Abnormal   Collection Time: 09/13/19  3:41 AM  Result Value Ref Range   WBC  10.6 (H) 4.0 - 10.5 K/uL   RBC 2.47 (L) 3.87 - 5.11 MIL/uL   Hemoglobin 8.2 (L) 12.0 - 15.0 g/dL   HCT 25.1 (L) 36.0 - 46.0 %   MCV 101.6 (H) 80.0 - 100.0 fL   MCH 33.2 26.0 - 34.0 pg   MCHC 32.7 30.0 - 36.0 g/dL   RDW 14.3 11.5 - 15.5 %   Platelets 174 150 - 400 K/uL   nRBC 0.0 0.0 - 0.2 %    Comment: Performed at [facility], [address] [place], [place] [address]  Glucose, capillary     Status: Abnormal   Collection Time: 09/13/19  5:02 AM  Result Value Ref Range   Glucose-Capillary 156 (H) 70 - 99 mg/dL  Glucose, capillary     Status: Abnormal   Collection Time: 09/13/19  7:26 AM  Result Value Ref Range   Glucose-Capillary 160 (H) 70 - 99 mg/dL  Glucose, capillary     Status: Abnormal   Collection Time: 09/13/19 11:19 AM  Result Value Ref Range   Glucose-Capillary 191 (H) 70 - 99 mg/dL    CT HEAD WO CONTRAST  Result Date: 09/11/2019 CLINICAL DATA:  Onset left-side neglect today. EXAM: CT HEAD WITHOUT CONTRAST TECHNIQUE: Contiguous axial images were obtained from the base of the skull through the vertex without intravenous contrast. COMPARISON:  Head CT 09/08/2019.  Brain MRI 09/27/2018. FINDINGS: Brain: No evidence of acute  infarction, hemorrhage, hydrocephalus, extra-axial collection or mass lesion/mass effect. Vascular: No hyperdense vessel.  Atherosclerosis noted. Skull: Intact.  No focal lesion. Sinuses/Orbits: Small right mastoid effusion is seen. Mucous retention cyst or polyp left maxillary sinus noted. Mild mucosal thickening scattered ethmoid air cells, sphenoid sinuses and right maxillary sinus also noted. Other: None. IMPRESSION: No acute abnormality finding to explain the patient's symptoms. Atherosclerosis. Mild sinus disease. Electronically Signed   By: Inge Rise M.D.   On: 09/11/2019 15:41   DG Chest Port 1 View  Result Date: 09/13/2019 CLINICAL DATA:  Respiratory failure with hypoxemia. EXAM: PORTABLE CHEST 1 VIEW COMPARISON:  Chest radiograph 09/10/2019, chest CT 09/08/2019 FINDINGS: ET tube is unchanged position with tip terminating at the level of the clavicular heads. An enteric tube passes below level of left hemidiaphragm with tip excluded from the field of view. Overlying cardiac monitoring leads. Unchanged cardiomediastinal silhouette with cardiomegaly. Right greater than left pleural effusions and bibasilar atelectasis, similar to prior examination. No evidence of pneumothorax. IMPRESSION: Support apparatus as described. Similar appearance of right greater than left pleural effusions and bibasilar atelectasis. Pneumonia at the lung bases cannot be excluded. Unchanged cardiomegaly. Electronically Signed   By: Kellie Simmering DO   On: 09/13/2019 07:13     Assessment & Plan:  [name] is a 69 y.o. female with end stage renal disease who needs longterm access and will need a tunneled dialysis catheter placed. She has no family or guardian. Given the need and patient wanting dialysis, Dr. Denton Brick, Dr. Marval Regal, and myself agree that this is medically necessary and have signed the consent.  Social work is aware and in agreement. Risk of procedure is bleeding, infection, pneumothorax, injury to  vessels.  Dr. Hilaria Ota anesthesia is aware and agrees.  -OR for tunneled dialysis catheter tomorrow -Leave intubated until after her procedure   Discussed with Dr. Denton Brick, Dr. Marval Regal, and Dr. Hilaria Ota, [place] staff and Social Work.   Virl Cagey 09/13/2019, 11:37 AM

## 2019-09-13 NOTE — H&P (View-Only) (Signed)
[facility] Consult  Reason for Consult: Dialysis access  Referring Physician:  Dr. Denton Brick Dr. Marval Regal   Chief Complaint    Shortness of Breath      HPI: [name] is a 69 y.o. female with worsening acute on chronic renal failure now requiring dialysis via a temporary femoral catheter I placed Monday. The patient came into the hospital with volume overload and worsening renal failure, and did not respond to lasix. She has since been intubated and had issues with fluid overload including SVT. She has now been dialyzed 2 times and is getting her third treatment today. She has no family and lives at a facility. She has no guardian. She is unable to consent at this time. She expressed to the nephrologist that she wanted to receive dialysis prior to intubation.   Past Medical History:  Diagnosis Date  . Anxiety   . Asthma   . Back pain   . Barrett esophagus   . CHF (congestive heart failure) ([place])   . CKD (chronic kidney disease) stage 3, GFR 30-59 ml/min   . COPD (chronic obstructive pulmonary disease) ([place])   . Depression   . Diastolic heart failure ([place])   . Essential hypertension   . Fibromyalgia   . GERD (gastroesophageal reflux disease)   . Gout   . History of pericarditis   . Hyperlipidemia   . Hypothyroid   . Major depressive disorder   . Memory loss   . Nephrolithiasis    2008  . Obesity hypoventilation syndrome (HCC)    CPAP  . Obstructive sleep apnea   . PE (pulmonary embolism)    2010  . Peripheral neuropathy   . PTSD (post-traumatic stress disorder)   . Rheumatoid arthritis ([place])   . Schizophrenia ([place])   . 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 Steroid dependence ([place])   . Type 2 diabetes mellitus ([place])   . Vitamin D deficiency     Past Surgical History:  Procedure Laterality Date  . APPENDECTOMY    . CHOLECYSTECTOMY    . COLONOSCOPY     benign polyps (12/2000), repeat colonoscopy performed in 2007  . ENDOMETRIAL  BIOPSY     10/03 benign columnar mucosa (limited material)  . KIDNEY SURGERY      Family History  Problem Relation Age of Onset  . Bone cancer Mother   . Hypertension Mother   . Heart disease Mother   . COPD Father   . Heart disease Father   . Stroke Father     Social History   Tobacco Use  . Smoking status: Former Smoker    Types: Cigarettes    Quit date: 06/23/1972    Years since quitting: 47.2  . Smokeless tobacco: Never Used  Substance Use Topics  . Alcohol use: No  . Drug use: No    Medications: I have reviewed the patient's current medications.  Allergies  Allergen Reactions  . Benztropine Mesylate Other (See Comments)    Alopecia and white spots on eyes  . Codeine Swelling, Palpitations and Other (See Comments)    Mouth Swelling and Tachycardia   . Diazepam Other (See Comments)    COMA  . Diphenhydramine Hcl Anxiety, Palpitations and Other (See Comments)    Tachycardia and anxiousness  . Penicillins Swelling and Other (See Comments)    REACTION: ulcers in mouth, swelling, throat swelling  Has patient had a PCN reaction causing immediate rash, facial/tongue/throat swelling, SOB or lightheadedness with hypotension: YES Has patient had  a PCN reaction causing severe rash involving Mucus membranes or skin necrosis: Unknown Has patient had a PCN reaction that required hospitalization Unknown Has patient had a PCN reaction occurring within the last 10 years: unknown If all of the above answers are "NO", then may proceed with Cephalospori  . Sulfonamide Derivatives Rash    Blisters, also  . Thioridazine Hcl Swelling  . Lisinopril Cough     ROS:  Review of systems not obtained due to patient factors.  Blood pressure (!) 145/74, pulse 83, temperature 99.5 F (37.5 C), temperature source Axillary, resp. rate (!) 25, height 5\' 2"  (1.575 m), weight 96.5 kg, SpO2 99 %. Physical Exam Vitals reviewed.  Constitutional:      Appearance: She is obese.  HENT:      Head: Normocephalic.  Eyes:     Extraocular Movements: Extraocular movements intact.  Cardiovascular:     Rate and Rhythm: Normal rate.  Pulmonary:     Effort: Pulmonary effort is normal.  Abdominal:     Palpations: Abdomen is soft.     Tenderness: There is no abdominal tenderness.  Musculoskeletal:     Right lower leg: Edema present.     Left lower leg: Edema present.  Skin:    General: Skin is warm.  Neurological:     Mental Status: She is alert.     Cranial Nerves: Cranial nerves are intact.     Comments: Eyes open, does not follow commands, tracks some with eyes, moving upper bilateral     Results: Results for orders placed or performed during the hospital encounter of 09/06/19 (from the past 48 hour(s))  Glucose, capillary     Status: Abnormal   Collection Time: 09/11/19  3:53 PM  Result Value Ref Range   Glucose-Capillary 100 (H) 70 - 99 mg/dL  Glucose, capillary     Status: Abnormal   Collection Time: 09/11/19  8:01 PM  Result Value Ref Range   Glucose-Capillary 132 (H) 70 - 99 mg/dL   Comment 1 Notify RN    Comment 2 Document in Chart   Hepatitis B surface antigen     Status: None   Collection Time: 09/11/19 11:07 PM  Result Value Ref Range   Hepatitis B Surface Ag NON REACTIVE NON REACTIVE    Comment: Performed at [facility] [address], [place], [place] [address]  Hepatitis B surface antibody     Status: Abnormal   Collection Time: 09/11/19 11:07 PM  Result Value Ref Range   Hepatitis B-Post <3.1 (L) Immunity>9.9 mIU/mL    Comment: (NOTE)  Status of Immunity                     Anti-HBs Level  ------------------                     -------------- Inconsistent with Immunity                   0.0 - 9.9 Consistent with Immunity                          >9.9 Performed At: [facility] [address] [place], [place] [id number] Rush Farmer MD [id number]   Hepatitis B core antibody, total     Status: None   Collection Time: 09/11/19  11:07 PM  Result Value Ref Range   Hep B Core Total Ab NON REACTIVE NON REACTIVE  Comment: Performed at [facility], [place] [address] [place], [place] [address]  Glucose, capillary     Status: Abnormal   Collection Time: 09/12/19 12:58 AM  Result Value Ref Range   Glucose-Capillary 111 (H) 70 - 99 mg/dL   Comment 1 Notify RN    Comment 2 Document in Chart   Renal function panel     Status: Abnormal   Collection Time: 09/12/19  4:03 AM  Result Value Ref Range   Sodium 138 135 - 145 mmol/L   Potassium 3.3 (L) 3.5 - 5.1 mmol/L   Chloride 102 98 - 111 mmol/L   CO2 24 22 - 32 mmol/L   Glucose, Bld 131 (H) 70 - 99 mg/dL   BUN 58 (H) 8 - 23 mg/dL   Creatinine, Ser 6.36 (H) 0.44 - 1.00 mg/dL   Calcium 7.9 (L) 8.9 - 10.3 mg/dL   Phosphorus 6.5 (H) 2.5 - 4.6 mg/dL   Albumin 1.8 (L) 3.5 - 5.0 g/dL   GFR calc non Af Amer 6 (L) >60 mL/min   GFR calc Af Amer 7 (L) >60 mL/min   Anion gap 12 5 - 15    Comment: Performed at [facility], [address], [place], [place] [address]  Glucose, capillary     Status: Abnormal   Collection Time: 09/12/19  4:05 AM  Result Value Ref Range   Glucose-Capillary 119 (H) 70 - 99 mg/dL   Comment 1 Notify RN    Comment 2 Document in Chart   Glucose, capillary     Status: Abnormal   Collection Time: 09/12/19  8:03 AM  Result Value Ref Range   Glucose-Capillary 137 (H) 70 - 99 mg/dL   Comment 1 Notify RN   Glucose, capillary     Status: Abnormal   Collection Time: 09/12/19 10:59 AM  Result Value Ref Range   Glucose-Capillary 155 (H) 70 - 99 mg/dL  Glucose, capillary     Status: Abnormal   Collection Time: 09/12/19  3:28 PM  Result Value Ref Range   Glucose-Capillary 171 (H) 70 - 99 mg/dL  Glucose, capillary     Status: Abnormal   Collection Time: 09/12/19  7:35 PM  Result Value Ref Range   Glucose-Capillary 165 (H) 70 - 99 mg/dL  Glucose, capillary     Status: Abnormal   Collection Time: 09/13/19 12:41 AM  Result Value Ref Range    Glucose-Capillary 145 (H) 70 - 99 mg/dL  Renal function panel     Status: Abnormal   Collection Time: 09/13/19  3:41 AM  Result Value Ref Range   Sodium 138 135 - 145 mmol/L   Potassium 3.8 3.5 - 5.1 mmol/L   Chloride 101 98 - 111 mmol/L   CO2 27 22 - 32 mmol/L   Glucose, Bld 169 (H) 70 - 99 mg/dL   BUN 45 (H) 8 - 23 mg/dL   Creatinine, Ser 4.61 (H) 0.44 - 1.00 mg/dL   Calcium 8.1 (L) 8.9 - 10.3 mg/dL   Phosphorus 4.4 2.5 - 4.6 mg/dL   Albumin 2.0 (L) 3.5 - 5.0 g/dL   GFR calc non Af Amer 9 (L) >60 mL/min   GFR calc Af Amer 11 (L) >60 mL/min   Anion gap 10 5 - 15    Comment: Performed at [facility], [address], [place], [place] [address]  CBC     Status: Abnormal   Collection Time: 09/13/19  3:41 AM  Result Value Ref Range   WBC  10.6 (H) 4.0 - 10.5 K/uL   RBC 2.47 (L) 3.87 - 5.11 MIL/uL   Hemoglobin 8.2 (L) 12.0 - 15.0 g/dL   HCT 25.1 (L) 36.0 - 46.0 %   MCV 101.6 (H) 80.0 - 100.0 fL   MCH 33.2 26.0 - 34.0 pg   MCHC 32.7 30.0 - 36.0 g/dL   RDW 14.3 11.5 - 15.5 %   Platelets 174 150 - 400 K/uL   nRBC 0.0 0.0 - 0.2 %    Comment: Performed at [facility], [address], [place], [place] [address]  Glucose, capillary     Status: Abnormal   Collection Time: 09/13/19  5:02 AM  Result Value Ref Range   Glucose-Capillary 156 (H) 70 - 99 mg/dL  Glucose, capillary     Status: Abnormal   Collection Time: 09/13/19  7:26 AM  Result Value Ref Range   Glucose-Capillary 160 (H) 70 - 99 mg/dL  Glucose, capillary     Status: Abnormal   Collection Time: 09/13/19 11:19 AM  Result Value Ref Range   Glucose-Capillary 191 (H) 70 - 99 mg/dL    CT HEAD WO CONTRAST  Result Date: 09/11/2019 CLINICAL DATA:  Onset left-side neglect today. EXAM: CT HEAD WITHOUT CONTRAST TECHNIQUE: Contiguous axial images were obtained from the base of the skull through the vertex without intravenous contrast. COMPARISON:  Head CT 09/08/2019.  Brain MRI 09/27/2018. FINDINGS: Brain: No evidence of acute  infarction, hemorrhage, hydrocephalus, extra-axial collection or mass lesion/mass effect. Vascular: No hyperdense vessel.  Atherosclerosis noted. Skull: Intact.  No focal lesion. Sinuses/Orbits: Small right mastoid effusion is seen. Mucous retention cyst or polyp left maxillary sinus noted. Mild mucosal thickening scattered ethmoid air cells, sphenoid sinuses and right maxillary sinus also noted. Other: None. IMPRESSION: No acute abnormality finding to explain the patient's symptoms. Atherosclerosis. Mild sinus disease. Electronically Signed   By: Inge Rise M.D.   On: 09/11/2019 15:41   DG Chest Port 1 View  Result Date: 09/13/2019 CLINICAL DATA:  Respiratory failure with hypoxemia. EXAM: PORTABLE CHEST 1 VIEW COMPARISON:  Chest radiograph 09/10/2019, chest CT 09/08/2019 FINDINGS: ET tube is unchanged position with tip terminating at the level of the clavicular heads. An enteric tube passes below level of left hemidiaphragm with tip excluded from the field of view. Overlying cardiac monitoring leads. Unchanged cardiomediastinal silhouette with cardiomegaly. Right greater than left pleural effusions and bibasilar atelectasis, similar to prior examination. No evidence of pneumothorax. IMPRESSION: Support apparatus as described. Similar appearance of right greater than left pleural effusions and bibasilar atelectasis. Pneumonia at the lung bases cannot be excluded. Unchanged cardiomegaly. Electronically Signed   By: Kellie Simmering DO   On: 09/13/2019 07:13     Assessment & Plan:  Raelei Bousman is a 69 y.o. female with end stage renal disease who needs longterm access and will need a tunneled dialysis catheter placed. She has no family or guardian. Given the need and patient wanting dialysis, Dr. Denton Brick, Dr. Marval Regal, and myself agree that this is medically necessary and have signed the consent.  Social work is aware and in agreement. Risk of procedure is bleeding, infection, pneumothorax, injury to  vessels.  Dr. Hilaria Ota anesthesia is aware and agrees.  -OR for tunneled dialysis catheter tomorrow -Leave intubated until after her procedure   Discussed with Dr. Denton Brick, Dr. Marval Regal, and Dr. Hilaria Ota, [place] staff and Social Work.   Virl Cagey 09/13/2019, 11:37 AM

## 2019-09-13 NOTE — Progress Notes (Signed)
PULMONARY / CRITICAL CARE MEDICINE   NAME:  [name], MRN:  [id number], DOB:  02-06-1951, LOS: 7 ADMISSION DATE:  09/06/2019, CONSULTATION DATE:  1/25 REFERRING MD:  [facility]  CHIEF COMPLAINT:  resp failure   BRIEF HISTORY:     22 yowf remote smoker carries dx of COPD/AB on pred ? How long and saba admitted with progressive wt gain, leg swelling sob with w/u c/w anasarca / bilateral effusions  more sob 1/23 and required intubation / mech with CTa neg PE, positive for mild bilateral pleural effusions with small lung voumes and PCCM asked so see 1/25    HISTORY OF PRESENT ILLNESS   No direct hx availabe but from H/P:  69 y.o. female   HTN, DM (on trulicity), hyperlipidemia, diastolic heart failure, schizoaffective D/O with episodes of catatonia and sz d/o- on primidone/cymbalta and risperdone. She also seems to have CKD- crt in the low to mid 2's in early 2020- then a crt in the system from October 2020 that was 3, followed by [name] as her neurologist who does not believe the patient has frank seizures --Presented to the ED with worsening shortness of breath, worsening lower extremity edema and decreased urine output despite reported  compliance with diuretics at home -- In the ED chest x-rays consistent with CHF -Creatinine admit  up to 6.34 creatinine was 3.1 on 06/11/2019 and creatinine was 2.1 on 10/09/2018 SIGNIFICANT PAST MEDICAL HISTORY    . Anxiety   . Asthma   . Back pain   . Barrett esophagus   . CHF (congestive heart failure) ([place])   . CKD (chronic kidney disease) stage 3, GFR 30-59 ml/min   . COPD (chronic obstructive pulmonary disease) ([place])   . Depression   . Diastolic heart failure ([place])   . Essential hypertension   . Fibromyalgia   . GERD (gastroesophageal reflux disease)   . Gout   . History of pericarditis   . Hyperlipidemia   . Hypothyroid   . Major depressive disorder   . Memory loss   . Nephrolithiasis    2008  . Obesity  hypoventilation syndrome (HCC)    CPAP  . Obstructive sleep apnea   . PE (pulmonary embolism)    2010  . Peripheral neuropathy   . PTSD (post-traumatic stress disorder)   . Rheumatoid arthritis ([place])   . Schizophrenia ([name])   . Secondary Parkinson disease ([name]) 10/19/2018  . Steroid dependence ([name])   . Type 2 diabetes mellitus ([name])   . Vitamin D deficiency    . APPENDECTOMY    . CHOLECYSTECTOMY    . COLONOSCOPY     benign polyps (12/2000), repeat colonoscopy performed in 2007  . ENDOMETRIAL BIOPSY     10/03 benign columnar mucosa (limited material)  . KIDNEY SURGERY       SIGNIFICANT EVENTS:  Respiratory  1/23 4pm resp distress on floor, rapid response > intubated and transferred to ICU Started HD 1/26  STUDIES:   CTa  Chest 1/23 1. No CT evidence of acute pulmonary embolism. 2. Moderate severity left upper lobe and right lower lobe atelectasis and/or infiltrate with marked severity consolidation seen throughout the entire right lower lobe. 3. Moderate size bilateral pleural effusions, right greater than Left. CT Head 1/23 no acute cahnge CT head 1/26 (? L side neglect) > no acute changes   CULTURES:  Covid  1/21 neg MRSA pcr  1/23 neg   ANTIBIOTICS:  Levaquin 1/24 >>>  LINES/TUBES:  Oral ET  1/23 L Fem HD 1/25    CONSULTANTS:  Neprology 1/21 PCCM 1/25   Scheduled Meds: . acetaminophen  325 mg Per NG tube BID  . aspirin  81 mg Per NG tube q morning - 10a  . atorvastatin  20 mg Per NG tube q1800  . calcitRIOL  0.25 mcg Per NG tube Q M,W,F  . chlorhexidine gluconate (MEDLINE KIT)  15 mL Mouth Rinse BID  . Chlorhexidine Gluconate Cloth  6 each Topical Daily  . Chlorhexidine Gluconate Cloth  6 each Topical Q0600  . cholecalciferol  4,000 Units Per NG tube q morning - 10a  . darbepoetin (ARANESP) injection - NON-DIALYSIS  150 mcg Subcutaneous Q Thu-1800  . feeding supplement (PRO-STAT SUGAR FREE 64)  30 mL Per Tube BID  . free water  60  mL Per Tube Q4H  . furosemide  80 mg Intravenous Q12H  . heparin injection (subcutaneous)  5,000 Units Subcutaneous Q8H  . influenza vaccine adjuvanted  0.5 mL Intramuscular Tomorrow-1000  . insulin aspart  0-6 Units Subcutaneous Q4H  . insulin glargine  16 Units Subcutaneous QHS  . levothyroxine  25 mcg Per NG tube Q0600  . mouth rinse  15 mL Mouth Rinse 10 times per day  . metoprolol tartrate  12.5 mg Per Tube BID  . montelukast  10 mg Per Tube QHS  . multivitamin  15 mL Per Tube Daily  . neomycin-bacitracin-polymyxin  1 application Topical Daily  . pantoprazole (PROTONIX) IV  40 mg Intravenous Daily  . primidone  50 mg Per NG tube q morning - 10a  . pyridOXINE  200 mg Per NG tube BID  . risperiDONE  0.5 mg Per NG tube BID  . sodium chloride flush  3 mL Intravenous Q12H  . traZODone  50 mg Per NG tube Daily   Continuous Infusions: . sodium chloride    . sodium chloride    . sodium chloride    . feeding supplement (VITAL AF 1.2 CAL) 1,000 mL (09/13/19 1112)  . levofloxacin (LEVAQUIN) IV 500 mg (09/13/19 0944)   PRN Meds:.sodium chloride, sodium chloride, sodium chloride, acetaminophen **OR** acetaminophen, albuterol, albuterol, alteplase, fentaNYL (SUBLIMAZE) injection, heparin, heparin, hydrOXYzine, labetalol, LORazepam, metoprolol tartrate, midazolam, ondansetron **OR** ondansetron (ZOFRAN) IV, polyethylene glycol, sodium chloride flush   SUBJECTIVE:  Alert, no specific symptoms, tol weaning well   CONSTITUTIONAL: BP (!) 149/70   Pulse 82   Temp 99.5 F (37.5 C) (Axillary)   Resp (!) 7   Ht '5\' 2"'$  (1.575 m)   Wt 96.5 kg   SpO2 99%   BMI 38.91 kg/m   I/O last 3 completed shifts: In: 795.3 [NG/GT:795.3] Out: 3950 [Urine:950; Other:3000]     Vent Mode: PSV;CPAP FiO2 (%):  [30 %-35 %] 30 % Set Rate:  [20 bmp] 20 bmp Vt Set:  [400 mL] 400 mL PEEP:  [5 cmH20] 5 cmH20 Pressure Support:  [8 cmH20-10 cmH20] 8 cmH20 Plateau Pressure:  [17 cmH20-19 cmH20] 18  cmH20  PHYSICAL EXAM: General:  Obese wf nad on vent  Neuro:  Moves all 4 to req HEENT: oral aET  Cardiovascular: RRR no s3 / trace to 1+ pitting ext Lungs:distant bs = bilaterally s wheeze  Abdomen obese/ soft  Skin: no rash/ skin breakdown     pCXR  1/29   I personally reviewed images and agree with radiology impression as follows:   Similar appearance of right greater than left pleural effusions and bibasilar atelectasis. Pneumonia at the lung bases cannot be  excluded.    RESOLVED PROBLEM LIST   ASSESSMENT AND PLAN    1) Acute resp failure hypoxemic and hypercarbic present on admit  in setting of vol overload, acute renal failure and anasarca/pleural effusions made worse by low albumin  - well compensated on present vent settings s air trapping but poor Lung/abd compliance related to pleural effusions/body habitus respectively  / no evidence of airflow obstruction despite previous use of saba and prednisone ? Why as she on pred previously?  >>> continue to work on weaning with plan to extubate when she wakes up from procedure am  1/29 (Tunneled HD catheter)   2) Acute renal failure/ low albumin > bilateral pleural effusions. >>>  Better p daily  HD started 1/26  >> rec keep as dry as tol to help weaning    3) Acute metabolic encephalopathy secondary to hypercarbia, present on admit. -  Much more alert since decreased sedation and started HD  4) Normocytic anemia typical of renal failure > rx per [facility] / renal    Lab Results  Component Value Date   HGB 8.2 (L) 09/13/2019   HGB 7.4 (L) 09/10/2019   HGB 7.0 (L) 09/09/2019      5) Plt  on Hep Lab Results  Component Value Date   PLT 174 09/13/2019   PLT 126 (L) 09/10/2019   PLT 155 09/09/2019    >>>>  No evidence HIT< appears to be recovering         SUMMARY OF TODAY'S PLAN:  Remain on vent until p HD catheter am 1/29  Best Practice / Goals of Care / Disposition.   DVT PROPHYLAXIS:  Sub Q hep SUP:  PPI NUTRITION:per [facility]  MOBILITY:as tol  GOALS OF CARE: per [facility]  FAMILY DISCUSSIONS: per [facility]  DISPOSITION remain ICU/ on Vent   LABS  Glucose Recent Labs  Lab 09/12/19 1528 09/12/19 1935 09/13/19 0041 09/13/19 0502 09/13/19 0726 09/13/19 1119  GLUCAP 171* 165* 145* 156* 160* 191*    BMET Recent Labs  Lab 09/11/19 0412 09/12/19 0403 09/13/19 0341  NA 141 138 138  K 3.6 3.3* 3.8  CL 105 102 101  CO2 '23 24 27  '$ BUN 73* 58* 45*  CREATININE 7.50* 6.36* 4.61*  GLUCOSE 73 131* 169*    Liver Enzymes Recent Labs  Lab 09/11/19 0412 09/12/19 0403 09/13/19 0341  ALBUMIN 2.0* 1.8* 2.0*    Electrolytes Recent Labs  Lab 09/10/19 0406 09/10/19 0406 09/11/19 0412 09/12/19 0403 09/13/19 0341  CALCIUM 7.7*   < > 7.7* 7.9* 8.1*  MG 2.3  --   --   --   --   PHOS 6.5*   < > 7.5* 6.5* 4.4   < > = values in this interval not displayed.    CBC Recent Labs  Lab 09/09/19 0424 09/10/19 0406 09/13/19 0341  WBC 9.0 10.2 10.6*  HGB 7.0* 7.4* 8.2*  HCT 20.9* 22.6* 25.1*  PLT 155 126* 174    ABG Recent Labs  Lab 09/08/19 1755 09/09/19 0424 09/10/19 0409  PHART 7.363 7.420 7.412  PCO2ART 46.7 40.8 41.0  PO2ART 77.8* 65.6* 66.0*    Coag's Recent Labs  Lab 09/09/19 1224  INR 1.1    Sepsis Markers No results for input(s): LATICACIDVEN, PROCALCITON, O2SATVEN in the last 168 hours.  Cardiac Enzymes No results for input(s): TROPONINI, PROBNP in the last 168 hours.    Total time today 35 minutes   Christinia Gully, MD Pulmonary and [facility] Cell [phone] After 5:30 PM or weekends, use [facility] [phone]

## 2019-09-13 NOTE — Progress Notes (Signed)
Patient placed in wean mode of CP/PSV. 8/5 with 30% FIO2. Patient is tolerating very well and getting great Vts at this time. She is stable hemodynamically and following commands.

## 2019-09-13 NOTE — TOC Progression Note (Signed)
Transition of Care [facility]) - Progression Note    Patient Details  Name: Beanca Bequette MRN: [id number] Date of Birth: 05-13-1951  [facility] CM/SW Contact  Ihor Gully, LCSW Phone Number: 09/13/2019, 4:56 PM  Clinical Narrative:    Patient is scheduled for [facility] in [place] on a MWF schedule @ 10:30 chair time.  Christy, with [facility], advised that patient may need SNF for short term rehab at discharge as she has been on the vent for a few days. Discussed having Medicaid transportation to transport her to ongoing appointments once she returns home from rehab should rehab be necessary.      Barriers to Discharge: Continued Medical Work up  Expected Discharge Plan and Services   In-house Referral: Clinical Social Work     Living arrangements for the past 2 months: Group Home                                       Social Determinants of Health (SDOH) Interventions    Readmission Risk Interventions Readmission Risk Prevention Plan 09/10/2019 11/29/2018  Transportation Screening Complete Complete  Medication Review (RN Care Manager) Complete Complete  PCP or Specialist appointment within 3-5 days of discharge - Not Complete  PCP/Specialist Appt Not Complete comments - earliest appt available was 12/08/18  [place] or [place] - Complete  SW Recovery Care/Counseling Consult - Complete  [place] - Not Applicable  Some recent data might be hidden

## 2019-09-14 ENCOUNTER — Inpatient Hospital Stay (HOSPITAL_COMMUNITY): Payer: Medicare Other

## 2019-09-14 ENCOUNTER — Encounter (HOSPITAL_COMMUNITY)
Admission: EM | Disposition: A | Discharge status: Skilled Nursing Facility | Payer: Self-pay | Source: Skilled Nursing Facility | Attending: Internal Medicine

## 2019-09-14 ENCOUNTER — Inpatient Hospital Stay (HOSPITAL_COMMUNITY): Payer: Medicare Other | Admitting: Anesthesiology

## 2019-09-14 DIAGNOSIS — J9601 Acute respiratory failure with hypoxia: Secondary | ICD-10-CM | POA: Diagnosis not present

## 2019-09-14 DIAGNOSIS — I5032 Chronic diastolic (congestive) heart failure: Secondary | ICD-10-CM | POA: Diagnosis not present

## 2019-09-14 DIAGNOSIS — Z9911 Dependence on respirator [ventilator] status: Secondary | ICD-10-CM | POA: Diagnosis not present

## 2019-09-14 DIAGNOSIS — E8779 Other fluid overload: Secondary | ICD-10-CM | POA: Diagnosis not present

## 2019-09-14 DIAGNOSIS — R0602 Shortness of breath: Secondary | ICD-10-CM | POA: Diagnosis not present

## 2019-09-14 DIAGNOSIS — N189 Chronic kidney disease, unspecified: Secondary | ICD-10-CM | POA: Diagnosis not present

## 2019-09-14 DIAGNOSIS — E8770 Fluid overload, unspecified: Secondary | ICD-10-CM | POA: Diagnosis not present

## 2019-09-14 DIAGNOSIS — N179 Acute kidney failure, unspecified: Secondary | ICD-10-CM | POA: Diagnosis not present

## 2019-09-14 DIAGNOSIS — Z20822 Contact with and (suspected) exposure to covid-19: Secondary | ICD-10-CM | POA: Diagnosis not present

## 2019-09-14 DIAGNOSIS — J9602 Acute respiratory failure with hypercapnia: Secondary | ICD-10-CM | POA: Diagnosis not present

## 2019-09-14 HISTORY — PX: INSERTION OF DIALYSIS CATHETER: SHX1324

## 2019-09-14 LAB — GLUCOSE, CAPILLARY
Glucose-Capillary: 148 mg/dL — ABNORMAL HIGH (ref 70–99)
Glucose-Capillary: 148 mg/dL — ABNORMAL HIGH (ref 70–99)
Glucose-Capillary: 156 mg/dL — ABNORMAL HIGH (ref 70–99)
Glucose-Capillary: 169 mg/dL — ABNORMAL HIGH (ref 70–99)
Glucose-Capillary: 147 mg/dL — ABNORMAL HIGH (ref 70–99)
Glucose-Capillary: 129 mg/dL — ABNORMAL HIGH (ref 70–99)
Glucose-Capillary: 133 mg/dL — ABNORMAL HIGH (ref 70–99)
Glucose-Capillary: 137 mg/dL — ABNORMAL HIGH (ref 70–99)

## 2019-09-14 LAB — RENAL FUNCTION PANEL
Albumin: 2.1 g/dL — ABNORMAL LOW (ref 3.5–5.0)
Anion gap: 13 (ref 5–15)
BUN: 32 mg/dL — ABNORMAL HIGH (ref 8–23)
CO2: 27 mmol/L (ref 22–32)
Calcium: 8.3 mg/dL — ABNORMAL LOW (ref 8.9–10.3)
Chloride: 97 mmol/L — ABNORMAL LOW (ref 98–111)
Creatinine, Ser: 3.18 mg/dL — ABNORMAL HIGH (ref 0.44–1.00)
GFR calc Af Amer: 17 mL/min — ABNORMAL LOW (ref >60)
GFR calc non Af Amer: 14 mL/min — ABNORMAL LOW (ref >60)
Glucose, Bld: 165 mg/dL — ABNORMAL HIGH (ref 70–99)
Phosphorus: 2.9 mg/dL (ref 2.5–4.6)
Potassium: 3.2 mmol/L — ABNORMAL LOW (ref 3.5–5.1)
Sodium: 137 mmol/L (ref 135–145)

## 2019-09-14 SURGERY — INSERTION OF DIALYSIS CATHETER
Anesthesia: General | Site: Chest | Laterality: Right

## 2019-09-14 MED ORDER — FENTANYL CITRATE (PF) 250 MCG/5ML IJ SOLN
INTRAMUSCULAR | Status: AC
  Filled 2019-09-14: qty 5

## 2019-09-14 MED ORDER — ROCURONIUM BROMIDE 100 MG/10ML IV SOLN
INTRAVENOUS | Status: DC | PRN
  Administered 2019-09-14: 20 mg via INTRAVENOUS

## 2019-09-14 MED ORDER — FENTANYL 2500MCG IN NS 250ML (10MCG/ML) PREMIX INFUSION
25.0000 ug/h | INTRAVENOUS | Status: DC
  Administered 2019-09-14: 50 ug/h via INTRAVENOUS
  Filled 2019-09-14: qty 250

## 2019-09-14 MED ORDER — ALBUTEROL SULFATE (2.5 MG/3ML) 0.083% IN NEBU
2.5000 mg | INHALATION_SOLUTION | Freq: Four times a day (QID) | RESPIRATORY_TRACT | Status: DC
  Administered 2019-09-14 – 2019-09-16 (×8): 2.5 mg via RESPIRATORY_TRACT
  Filled 2019-09-14 (×8): qty 3

## 2019-09-14 MED ORDER — ACETYLCYSTEINE 20 % IN SOLN
2.0000 mL | Freq: Four times a day (QID) | RESPIRATORY_TRACT | Status: DC
  Administered 2019-09-14 – 2019-09-15 (×2): 2 mL via RESPIRATORY_TRACT
  Filled 2019-09-14 (×3): qty 4

## 2019-09-14 MED ORDER — VANCOMYCIN VARIABLE DOSE PER UNSTABLE RENAL FUNCTION (PHARMACIST DOSING): Status: DC

## 2019-09-14 MED ORDER — SODIUM CHLORIDE 0.9 % IV SOLN
500.0000 mg | INTRAVENOUS | Status: DC
  Administered 2019-09-14 – 2019-09-16 (×3): 500 mg via INTRAVENOUS
  Filled 2019-09-14 (×6): qty 0.5

## 2019-09-14 MED ORDER — FENTANYL CITRATE (PF) 100 MCG/2ML IJ SOLN
25.0000 ug | Freq: Once | INTRAMUSCULAR | Status: AC
  Administered 2019-09-14: 25 ug via INTRAVENOUS

## 2019-09-14 MED ORDER — LIDOCAINE HCL (PF) 1 % IJ SOLN
INTRAMUSCULAR | Status: DC | PRN
  Administered 2019-09-14: 10 mL

## 2019-09-14 MED ORDER — ROCURONIUM BROMIDE 10 MG/ML (PF) SYRINGE
PREFILLED_SYRINGE | INTRAVENOUS | Status: AC
  Filled 2019-09-14: qty 10

## 2019-09-14 MED ORDER — LIDOCAINE HCL (PF) 1 % IJ SOLN
INTRAMUSCULAR | Status: AC
  Filled 2019-09-14: qty 30

## 2019-09-14 MED ORDER — HEPARIN SODIUM (PORCINE) 1000 UNIT/ML IJ SOLN
INTRAMUSCULAR | Status: AC
  Filled 2019-09-14: qty 4

## 2019-09-14 MED ORDER — VANCOMYCIN HCL 2000 MG/400ML IV SOLN
2000.0000 mg | Freq: Once | INTRAVENOUS | Status: AC
  Administered 2019-09-14: 2000 mg via INTRAVENOUS
  Filled 2019-09-14: qty 400

## 2019-09-14 MED ORDER — PROPOFOL 10 MG/ML IV BOLUS
INTRAVENOUS | Status: AC
  Filled 2019-09-14: qty 20

## 2019-09-14 MED ORDER — FENTANYL BOLUS VIA INFUSION
25.0000 ug | INTRAVENOUS | Status: DC | PRN
  Administered 2019-09-14 – 2019-09-15 (×3): 25 ug via INTRAVENOUS
  Filled 2019-09-14: qty 25

## 2019-09-14 SURGICAL SUPPLY — 48 items
ADH SKN CLS APL DERMABOND .7 (GAUZE/BANDAGES/DRESSINGS) ×1
APL PRP STRL LF ISPRP CHG 10.5 (MISCELLANEOUS) ×1
APPLICATOR CHLORAPREP 10.5 ORG (MISCELLANEOUS) ×2 IMPLANT
BAG DECANTER FOR FLEXI CONT (MISCELLANEOUS) ×2 IMPLANT
BANDAGE STRIP 1X3 FLEXIBLE (GAUZE/BANDAGES/DRESSINGS) ×1 IMPLANT
BIOPATCH RED 1 DISK 7.0 (GAUZE/BANDAGES/DRESSINGS) ×2 IMPLANT
CATH PALINDROME-P 19CM W/VT (CATHETERS) ×1 IMPLANT
COVER LIGHT HANDLE STERIS (MISCELLANEOUS) ×4 IMPLANT
COVER PROBE U/S 5X48 (MISCELLANEOUS) ×2 IMPLANT
COVER WAND RF STERILE (DRAPES) ×2 IMPLANT
DECANTER SPIKE VIAL GLASS SM (MISCELLANEOUS) ×4 IMPLANT
DERMABOND ADVANCED (GAUZE/BANDAGES/DRESSINGS) ×1
DERMABOND ADVANCED .7 DNX12 (GAUZE/BANDAGES/DRESSINGS) ×1 IMPLANT
DRAPE C-ARM FOLDED MOBILE STRL (DRAPES) ×2 IMPLANT
DRAPE CHEST BREAST 15X10 FENES (DRAPES) ×2 IMPLANT
DRSG SORBAVIEW 3.5X5-5/16 MED (GAUZE/BANDAGES/DRESSINGS) ×2 IMPLANT
ELECT REM PT RETURN 9FT ADLT (ELECTROSURGICAL) ×2
ELECTRODE REM PT RTRN 9FT ADLT (ELECTROSURGICAL) ×1 IMPLANT
GAUZE 4X4 16PLY RFD (DISPOSABLE) ×2 IMPLANT
GEL ULTRASOUND 20GR AQUASONIC (MISCELLANEOUS) ×2 IMPLANT
GLOVE BIO SURGEON STRL SZ 6.5 (GLOVE) ×2 IMPLANT
GLOVE BIO SURGEON STRL SZ7 (GLOVE) ×1 IMPLANT
GLOVE BIOGEL PI IND STRL 6.5 (GLOVE) ×1 IMPLANT
GLOVE BIOGEL PI IND STRL 7.0 (GLOVE) ×2 IMPLANT
GLOVE BIOGEL PI INDICATOR 6.5 (GLOVE) ×1
GLOVE BIOGEL PI INDICATOR 7.0 (GLOVE) ×2
GOWN STRL REUS W/TWL LRG LVL3 (GOWN DISPOSABLE) ×4 IMPLANT
IV NS 500ML (IV SOLUTION) ×2
IV NS 500ML BAXH (IV SOLUTION) ×1 IMPLANT
KIT BLADEGUARD II DBL (SET/KITS/TRAYS/PACK) ×2 IMPLANT
KIT TURNOVER KIT A (KITS) ×2 IMPLANT
MARKER SKIN DUAL TIP RULER LAB (MISCELLANEOUS) ×2 IMPLANT
NDL HYPO 18GX1.5 BLUNT FILL (NEEDLE) ×1 IMPLANT
NDL HYPO 25X1 1.5 SAFETY (NEEDLE) ×1 IMPLANT
NEEDLE HYPO 18GX1.5 BLUNT FILL (NEEDLE) ×2 IMPLANT
NEEDLE HYPO 25X1 1.5 SAFETY (NEEDLE) ×2 IMPLANT
PACK BASIC III (CUSTOM PROCEDURE TRAY) ×2
PACK SRG BSC III STRL LF ECLPS (CUSTOM PROCEDURE TRAY) ×1 IMPLANT
PAD ARMBOARD 7.5X6 YLW CONV (MISCELLANEOUS) ×2 IMPLANT
PENCIL HANDSWITCHING (ELECTRODE) ×2 IMPLANT
SET BASIN LINEN APH (SET/KITS/TRAYS/PACK) ×2 IMPLANT
SUT MNCRL AB 4-0 PS2 18 (SUTURE) ×2 IMPLANT
SUT SILK 2 0 FSL 18 (SUTURE) ×2 IMPLANT
SUT VIC AB 3-0 SH 27 (SUTURE) ×2
SUT VIC AB 3-0 SH 27X BRD (SUTURE) ×1 IMPLANT
SYR 20ML LL LF (SYRINGE) ×2 IMPLANT
SYR 5ML LL (SYRINGE) ×2 IMPLANT
SYR CONTROL 10ML LL (SYRINGE) ×2 IMPLANT

## 2019-09-14 NOTE — Progress Notes (Signed)
Pharmacy Antibiotic Note  [name] is a 69 y.o. female admitted on 09/06/2019 with pneumonia.  Pharmacy has been consulted for Vancomycin and Meropenem dosing.  Patient w/ AKI- CKD stage IV- HD 1/26, 1/27, and 1/28.  Plan: Vancomycin 2000 mg IV x 1 dose. Goal trough 15-20 mcg/mL. Variable dosing of vanco per unstable renal function. Meropenem 500 mg IV every 24 hours. Monitor labs, c/s, and vanco level as indicated   Height: 5\' 2"  (157.5 cm) Weight: 205 lb 0.4 oz (93 kg) IBW/kg (Calculated) : 50.1  Temp (24hrs), Avg:99.7 F (37.6 C), Min:99.1 F (37.3 C), Max:100 F (37.8 C)  Recent Labs  Lab 09/08/19 1544 09/08/19 1544 09/09/19 0424 09/09/19 0424 09/10/19 0406 09/11/19 0412 09/12/19 0403 09/13/19 0341 09/14/19 0507  WBC 6.7  --  9.0  --  10.2  --   --  10.6*  --   CREATININE 6.75*   < > 6.42*   < > 6.93* 7.50* 6.36* 4.61* 3.18*   < > = values in this interval not displayed.    Estimated Creatinine Clearance: 18.2 mL/min (A) (by C-G formula based on SCr of 3.18 mg/dL (H)).    Allergies  Allergen Reactions  . Benztropine Mesylate Other (See Comments)    Alopecia and white spots on eyes  . Codeine Swelling, Palpitations and Other (See Comments)    Mouth Swelling and Tachycardia   . Diazepam Other (See Comments)    COMA  . Diphenhydramine Hcl Anxiety, Palpitations and Other (See Comments)    Tachycardia and anxiousness  . Penicillins Swelling and Other (See Comments)    REACTION: ulcers in mouth, swelling, throat swelling  Has patient had a PCN reaction causing immediate rash, facial/tongue/throat swelling, SOB or lightheadedness with hypotension: YES Has patient had a PCN reaction causing severe rash involving Mucus membranes or skin necrosis: Unknown Has patient had a PCN reaction that required hospitalization Unknown Has patient had a PCN reaction occurring within the last 10 years: unknown If all of the above answers are "NO", then may proceed with  Cephalospori  . Sulfonamide Derivatives Rash    Blisters, also  . Thioridazine Hcl Swelling  . Lisinopril Cough    Antimicrobials this admission: Vanco 1/29 >>  Merrem 1/29 >>  Levaquin 1/26 >> 1/29   Microbiology results: 1/29 Sputum: pending  1/23 MRSA PCR: neg  Thank you for allowing pharmacy to be a part of this patient's care.  Margot Ables, PharmD Clinical Pharmacist 09/14/2019 1:13 PM

## 2019-09-14 NOTE — Progress Notes (Signed)
Pt returned from OR. Placed back on the ventilator. She was alarming PAW. Suction done and lrg thick amounts of secretions noted. Patient was also having an episode of desaturation and decreased VT. Her ETT was noted to be at 19cm so I advanced the tube to 21 where it was originally. Alarms stopped and patient starting getting better VTs. Patients SPO2 also increased to 99%.

## 2019-09-14 NOTE — Progress Notes (Signed)
Nutrition Follow-up  DOCUMENTATION CODES:   Obesity unspecified  INTERVENTION:  When patient is cleared to resume tube feeds: -Vital 1.2 @ 40 ml/hr via OGT.   - 30  ml Prostat  BID per tube.    Tube feeding regimen provides 1352 kcal, 105 grams of protein, and  779 ml of H2O  -Free water 60 ml q 4hrs  NUTRITION DIAGNOSIS:   Inadequate oral intake related to inability to eat as evidenced by NPO status.   GOAL:   Provide needs based on ASPEN/SCCM guidelines   MONITOR:   Vent status, Labs, Weight trends, Skin, I & O's   ASSESSMENT:   Patient is 69 yo currently intubated on ventilator support (1/23). Bilateral effusions. Hx of  CHF, COPD-presents with shortness of breath. Patient had temporary dialysis line placed today. Acute on chronic renal failure.  Patient had first HD 1/26 via left femoral non-tunneled catheter. 1 liter removed. Temporary catheter was placed on 1/25. 1/28-HD 2 liters removed- hemodynamically stable per RN.  Patient more alert and tolerating wean mode. Patient scheduled for OR-tunneled dialysis catheter.   1/29- CXR-right plural effusions and bibasilar atelectasis.  HD today -3 liters removed via femoral cath.  Patient placement of tunneled catheter for today was aborted per MD note. Patient had episode of emesis after return from OR. Patient is being transferred to [facility] ICU. Tube feeding being held. Needs re-estimated.  Patient remains intubated on ventilator support (day-6). MV: 8.5 L/min Temp (24hrs), Avg:99.1 F (37.3 C), Min:98 F (36.7 C), Max:100 F (37.8 C)  Fentanyl drip:  Bolus 25 mcg       Medications: Vitamin D3, ProStat 30 ml BID  Labs:  BMP Latest Ref Rng & Units 09/14/2019 09/13/2019 09/12/2019  Glucose 70 - 99 mg/dL 165(H) 169(H) 131(H)  BUN 8 - 23 mg/dL 32(H) 45(H) 58(H)  Creatinine 0.44 - 1.00 mg/dL 3.18(H) 4.61(H) 6.36(H)  BUN/Creat Ratio 6 - 22 (calc) - - -  Sodium 135 - 145 mmol/L 137 138 138  Potassium 3.5 - 5.1 mmol/L  3.2(L) 3.8 3.3(L)  Chloride 98 - 111 mmol/L 97(L) 101 102  CO2 22 - 32 mmol/L 27 27 24   Calcium 8.9 - 10.3 mg/dL 8.3(L) 8.1(L) 7.9(L)      Intake/Output Summary (Last 24 hours) at 09/14/2019 1401 Last data filed at 09/14/2019 1130 Gross per 24 hour  Intake --  Output 4455 ml  Net -4455 ml  Since admission -10.476 liters    Diet Order:   Diet Order    None      EDUCATION NEEDS:   No education needs have been identified at this time  Skin:  Skin Assessment: Reviewed RN Assessment  Last BM:  unknown  Height:   Ht Readings from Last 1 Encounters:  09/11/19 5\' 2"  (1.575 m)    Weight:   Wt Readings from Last 1 Encounters:  09/14/19 93 kg  wt on 1/24-99.9 kg -desirable loss 7 kg  Ideal Body Weight:   50 kg  BMI:  Body mass index is 37.5 kg/m.  Estimated Nutritional Needs:   Kcal: 1649     Protein:  100-110 gr  Fluid:  per MD goals  Colman Cater MS,RD,CSG,LDN Office: [phone] Pager: [phone]

## 2019-09-14 NOTE — Transfer of Care (Signed)
Immediate Anesthesia Transfer of Care Note  Patient: [name]  Procedure(s) Performed: ATTEMPTED INSERTION OF DIALYSIS CATHETER (Right Chest)  Patient Location: PACU and ICU  Anesthesia Type:General  Level of Consciousness: awake, alert  and drowsy  Airway & Oxygen Therapy: Patient Spontanous Breathing and Patient placed on Ventilator (see vital sign flow sheet for setting)  Post-op Assessment: Report given to RN and Post -op Vital signs reviewed and stable  Post vital signs: Reviewed and stable  Last Vitals:  Vitals Value Taken Time  BP    Temp    Pulse    Resp    SpO2      Last Pain:  Vitals:   09/14/19 0400  TempSrc: Axillary  PainSc:          Complications: No apparent anesthesia complications

## 2019-09-14 NOTE — Procedures (Signed)
     HEMODIALYSIS TREATMENT NOTE (HD#3):  3 hour low heparin treatment completed via left femoral vas-cath.  Goal met: 3 liters removed.  Hemodynamically stable throughout session.  All blood was returned.  Rockwell Alexandria, RN

## 2019-09-14 NOTE — Progress Notes (Signed)
PCCM interval progress note:  [facility] consult for permanent HD access.  On arrival, vitals are stable and patient is intubated.  Plan to remain intubated until HD catheter in place.  Pt is wide awake and appears uncomfortable, nods when asked if in pain.  Will add Fentanyl gtt overnight.   Otilio Carpen Manoah Deckard, PA-C

## 2019-09-14 NOTE — Anesthesia Postprocedure Evaluation (Signed)
Anesthesia Post Note  Patient: [name]  Procedure(s) Performed: ATTEMPTED INSERTION OF DIALYSIS CATHETER (Right Chest)  Patient location during evaluation: ICU Anesthesia Type: General Level of consciousness: awake Pain management: pain level controlled Vital Signs Assessment: post-procedure vital signs reviewed and stable Respiratory status: pt came and left intubated. Cardiovascular status: blood pressure returned to baseline Postop Assessment: no headache Anesthetic complications: no Comments: Prognosis guarded, Dr. Constance Haw unable to get New Access. Manage per ICU Came to OR intubated , left intubated     Last Vitals:  Vitals:   09/14/19 1030 09/14/19 1230  BP: (!) 164/58 (!) 182/76  Pulse: 81 92  Resp: 18 (!) 22  Temp:    SpO2: 95% (!) 88%    Last Pain:  Vitals:   09/14/19 0400  TempSrc: Axillary  PainSc:                  Lenice Llamas

## 2019-09-14 NOTE — Op Note (Signed)
Operative Note 09/14/19   Preoperative Diagnosis: End Stage Renal Disease    Postoperative Diagnosis: Same   Procedure(s) Performed: ABORTED Tunneled Dialysis Catheter Placement, Right Internal Jugular    Surgeon: Lanell Matar. Constance Haw, MD   Assistants: No qualified resident was available   Anesthesia: General anesthesia    Anesthesiologist: Lenice Llamas, MD    Specimens: None   Estimated Blood Loss: Minimal    Blood Replacement: None    Complications: None    Operative Findings:  Ultrasound with patent small right IJ and enlarged tributaries, confluence of veins appreciated and then appears to have clot and non compressible distal vein, vein accessed but unable to thread wire   Indications: Ms. [name] is a 69 yo with a history of worsening renal failure who has no family and no guardian. After careful consideration and three physician approval we determined she would need longer term dialysis access with a tunneled catheter.  She has been dialyzing with a left femoral dialysis catheter temporally.   Procedure: The patient was brought into the operating room from the ICU already intubated and general anesthesia was induced.   The right chest and neck was prepped and draped in the usual sterile fashion.  Preoperative antibiotics were given.   An Ultrasound was used to verify that the right internal jugular vein was patent but there were enlarged superficial tributaries and following the confluence down to the internal jugular there appeared to be a clot distally as the vein lumen was not as clearly visible or compressible. One percent lidocaine was used for local anesthesia.  I attempted cannulation and was able to get into the internal jugular without issue with good flow back, but the wire would not pass down into the chest. Given this and the fact that the patient has no true guardian or power of attorney, I have opted to not perform the left tunneled dialysis catheter as I do not do  venography and could not further investigate the issues safely.  The procedure was aborted. The area was cleaned and bandaids applied.  The primary team and nephrology were noted for transfer for dialysis access.   All tape and needle counts were correct at the end of the procedure.  A chest x-ray will be performed in the ICU.   Curlene Labrum, MD [facility] [address] [place], [place] [address] [phone] (office)

## 2019-09-14 NOTE — Progress Notes (Signed)
Patient ID: [name], female   DOB: 1951-04-11, 69 y.o.   MRN: [id number] S: Seen in her room after sent back from the OR after a failed TDC placement today.  She is awake and agitated with emesis of green bilious material.  O:BP (!) 149/62   Pulse 87   Temp 99.9 F (37.7 C) (Axillary)   Resp 20   Ht '5\' 2"'$  (1.575 m)   Wt 93 kg   SpO2 95%   BMI 37.50 kg/m   Intake/Output Summary (Last 24 hours) at 09/14/2019 1226 Last data filed at 09/14/2019 1130 Gross per 24 hour  Intake --  Output 4455 ml  Net -4455 ml   Intake/Output: I/O last 3 completed shifts: In: 100 [IV Piggyback:100] Out: 6834 [Urine:1750; Other:5000]  Intake/Output this shift:  Total I/O In: -  Out: 5 [Blood:5] Weight change: -1 kg Gen: obese WF intubated and agitated CVS: no rub Resp: decreased BS Abd: +BS, soft Ext: trace edema  Recent Labs  Lab 09/08/19 0525 09/08/19 0525 09/08/19 1544 09/09/19 0424 09/10/19 0406 09/11/19 0412 09/12/19 0403 09/13/19 0341 09/14/19 0507  NA 138   < > 138 138 139 141 138 138 137  K 3.5   < > 4.3 3.2* 3.7 3.6 3.3* 3.8 3.2*  CL 99   < > 99 101 102 105 102 101 97*  CO2 28   < > '27 25 23 23 24 27 27  '$ GLUCOSE 110*   < > 223* 100* 129* 73 131* 169* 165*  BUN 68*   < > 70* 70* 69* 73* 58* 45* 32*  CREATININE 6.26*   < > 6.75* 6.42* 6.93* 7.50* 6.36* 4.61* 3.18*  ALBUMIN 2.5*  --   --  2.3* 2.0* 2.0* 1.8* 2.0* 2.1*  CALCIUM 8.0*   < > 8.2* 8.1* 7.7* 7.7* 7.9* 8.1* 8.3*  PHOS 6.8*  --   --  6.4* 6.5* 7.5* 6.5* 4.4 2.9   < > = values in this interval not displayed.   Liver Function Tests: Recent Labs  Lab 09/12/19 0403 09/13/19 0341 09/14/19 0507  ALBUMIN 1.8* 2.0* 2.1*   No results for input(s): LIPASE, AMYLASE in the last 168 hours. No results for input(s): AMMONIA in the last 168 hours. CBC: Recent Labs  Lab 09/08/19 1544 09/08/19 1544 09/09/19 0424 09/10/19 0406 09/13/19 0341  WBC 6.7   < > 9.0 10.2 10.6*  HGB 8.7*   < > 7.0* 7.4* 8.2*  HCT 26.3*   <  > 20.9* 22.6* 25.1*  MCV 100.4*  --  97.7 98.7 101.6*  PLT 171   < > 155 126* 174   < > = values in this interval not displayed.   Cardiac Enzymes: No results for input(s): CKTOTAL, CKMB, CKMBINDEX, TROPONINI in the last 168 hours. CBG: Recent Labs  Lab 09/13/19 1521 09/13/19 1943 09/14/19 0154 09/14/19 0439 09/14/19 0829  GLUCAP 170* 151* 148* 148* 156*    Iron Studies: No results for input(s): IRON, TIBC, TRANSFERRIN, FERRITIN in the last 72 hours. Studies/Results: DG Chest Port 1 View  Result Date: 09/13/2019 CLINICAL DATA:  Respiratory failure with hypoxemia. EXAM: PORTABLE CHEST 1 VIEW COMPARISON:  Chest radiograph 09/10/2019, chest CT 09/08/2019 FINDINGS: ET tube is unchanged position with tip terminating at the level of the clavicular heads. An enteric tube passes below level of left hemidiaphragm with tip excluded from the field of view. Overlying cardiac monitoring leads. Unchanged cardiomediastinal silhouette with cardiomegaly. Right greater than left pleural effusions and bibasilar atelectasis,  similar to prior examination. No evidence of pneumothorax. IMPRESSION: Support apparatus as described. Similar appearance of right greater than left pleural effusions and bibasilar atelectasis. Pneumonia at the lung bases cannot be excluded. Unchanged cardiomegaly. Electronically Signed   By: Kellie Simmering DO   On: 09/13/2019 07:13   DG C-Arm 1-60 Min-No Report  Result Date: 09/14/2019 Fluoroscopy was utilized by the requesting physician.  No radiographic interpretation.   [name] Kitchen acetaminophen  325 mg Per NG tube BID  . aspirin  81 mg Per NG tube q morning - 10a  . atorvastatin  20 mg Per NG tube q1800  . calcitRIOL  0.25 mcg Per NG tube Q M,W,F  . chlorhexidine gluconate (MEDLINE KIT)  15 mL Mouth Rinse BID  . Chlorhexidine Gluconate Cloth  6 each Topical Daily  . Chlorhexidine Gluconate Cloth  6 each Topical Q0600  . cholecalciferol  4,000 Units Per NG tube q morning - 10a  .  darbepoetin (ARANESP) injection - NON-DIALYSIS  150 mcg Subcutaneous Q Thu-1800  . feeding supplement (PRO-STAT SUGAR FREE 64)  30 mL Per Tube BID  . free water  60 mL Per Tube Q4H  . furosemide  80 mg Intravenous Q12H  . heparin injection (subcutaneous)  5,000 Units Subcutaneous Q8H  . influenza vaccine adjuvanted  0.5 mL Intramuscular Tomorrow-1000  . insulin aspart  0-6 Units Subcutaneous Q4H  . insulin glargine  16 Units Subcutaneous QHS  . levothyroxine  25 mcg Per NG tube Q0600  . mouth rinse  15 mL Mouth Rinse 10 times per day  . metoprolol tartrate  12.5 mg Per Tube BID  . montelukast  10 mg Per Tube QHS  . multivitamin  15 mL Per Tube Daily  . neomycin-bacitracin-polymyxin  1 application Topical Daily  . pantoprazole (PROTONIX) IV  40 mg Intravenous Daily  . primidone  50 mg Per NG tube q morning - 10a  . pyridOXINE  200 mg Per NG tube BID  . risperiDONE  0.5 mg Per NG tube BID  . sodium chloride flush  3 mL Intravenous Q12H  . traZODone  50 mg Per NG tube Daily    BMET    Component Value Date/Time   NA 137 09/14/2019 0507   K 3.2 (L) 09/14/2019 0507   CL 97 (L) 09/14/2019 0507   CO2 27 09/14/2019 0507   GLUCOSE 165 (H) 09/14/2019 0507   BUN 32 (H) 09/14/2019 0507   CREATININE 3.18 (H) 09/14/2019 0507   CREATININE 2.54 (H) 08/02/2018 1411   CALCIUM 8.3 (L) 09/14/2019 0507   GFRNONAA 14 (L) 09/14/2019 0507   GFRAA 17 (L) 09/14/2019 0507   CBC    Component Value Date/Time   WBC 10.6 (H) 09/13/2019 0341   RBC 2.47 (L) 09/13/2019 0341   HGB 8.2 (L) 09/13/2019 0341   HCT 25.1 (L) 09/13/2019 0341   PLT 174 09/13/2019 0341   MCV 101.6 (H) 09/13/2019 0341   MCH 33.2 09/13/2019 0341   MCHC 32.7 09/13/2019 0341   RDW 14.3 09/13/2019 0341   LYMPHSABS 0.5 (L) 09/06/2019 0343   MONOABS 0.3 09/06/2019 0343   EOSABS 0.0 09/06/2019 0343   BASOSABS 0.0 09/06/2019 0343    Assessment/Plan:  1. AKI/subacute kidney injury vs progressive CKD - underlying CKD stage 4 due  to DM and had been followed by Dr. Lowanda Foster in the past. She is volume overloaded and BUN/Cr climbing with diuresis. 1. S/p left femoral HD catheter 09/10/19 2. Initiated serial HD on 09/11/19 and had  3rd HD session on 09/13/19. 3. Still making urine which has picked up over the past 48 hours 4. Will need SW assistance in arranging for outpatient HD if her renal function does not return 5. Will need transfer to [facility] for IR or more preferably VVS for [facility] and AVF/AVG placement. 2. Acute hypoxic respiratory failure- acute decompensation on 09/08/19. She was subsequently intubated and transferred to the ICU. CT angio negative for PE. Plan to attempt UF with HD as toleratedand PCCM to reassess extubation after volume removal. 3. Vascular access-temporary left femoral HD catheter placed 09/10/19 as she was too unstable for [facility] with tachycardia and hypotension. Appreciate assistance of Dr. Constance Haw. 1. Unable to place guidewire into the chest for [facility] today so will need transfer to [facility] for IR or VVS assistance 2. Will need vascular surgery consult for AVF/AVG placement given her advanced CKD at baseline (last eGFR was 15 ml/min in October 2020) 4. AMS- CT scan negative for acute changes. Eyes open but not following commands 5. Acute on chronic diastolic CHF- volume overloaded and will UF as tolerated 6. Anemia of CKD- on ESA. Transfuse prn. 7. Morbid obesity/OSA- intubated 8. Tachycardia-improved with cardizem/metoprolol. 9. Disposition- given need for [facility] and AVF/AVG placement as well as PCCM for VDRF agree with transfer to [facility] ICU.  Donetta Potts, MD [employer] [phone]

## 2019-09-14 NOTE — Progress Notes (Signed)
Patient in the care of [facility] transport team and leaving ICU as of 2040. Report called to [facility] and recalled to advise of patient leaving premises  Report verified with Ronny Bacon RN

## 2019-09-14 NOTE — Anesthesia Preprocedure Evaluation (Signed)
Anesthesia Evaluation  Patient identified by MRN, date of birth, ID bandGeneral Assessment Comment:Pt intubated  Without Family or POA to consent  History per Medicines notes  ON HD -needs longer term access Deemed necessary by other MDs Will continue supportive care -in OR  Reviewed: Allergy & Precautions, NPO status , Patient's Chart, lab work & pertinent test results  Airway Mallampati: Intubated  TM Distance: >3 FB Neck ROM: Full    Dental no notable dental hx.    Pulmonary asthma , sleep apnea , COPD, former smoker,  Currently intubated   Pulmonary exam normal breath sounds clear to auscultation   + intubated    Cardiovascular Exercise Tolerance: Poor hypertension, +CHF  Normal cardiovascular exam+ dysrhythmias  Rhythm:Regular Rate:Normal     Neuro/Psych Seizures -,  Anxiety Depression Schizophrenia  Neuromuscular disease negative psych ROS   GI/Hepatic Neg liver ROS, GERD  ,  Endo/Other  diabetesHypothyroidism   Renal/GU ESRF and DialysisRenal disease  negative genitourinary   Musculoskeletal  (+) Arthritis , Fibromyalgia -  Abdominal   Peds negative pediatric ROS (+)  Hematology negative hematology ROS (+) anemia , H/h ~8/25   Anesthesia Other Findings   Reproductive/Obstetrics negative OB ROS                             Anesthesia Physical Anesthesia Plan  ASA: IV  Anesthesia Plan: General   Post-op Pain Management:    Induction: Intravenous  PONV Risk Score and Plan: 3 and Ondansetron, Dexamethasone and Treatment may vary due to age or medical condition  Airway Management Planned: Oral ETT  Additional Equipment:   Intra-op Plan:   Post-operative Plan: Post-operative intubation/ventilation  Informed Consent: I have reviewed the patients History and Physical, chart, labs and discussed the procedure including the risks, benefits and alternatives for the proposed  anesthesia with the patient or authorized representative who has indicated his/her understanding and acceptance.       Plan Discussed with: CRNA  Anesthesia Plan Comments: (See prior  Deemed medically necessary by Dr. Constance Haw and other MDs  Will continue supportive care  Plan Transport  to OR with ETT Plan back to ICU after line for ICU continued care )        Anesthesia Quick Evaluation

## 2019-09-14 NOTE — Progress Notes (Signed)
[facility] Physician-Brief Progress Note Patient Name: Midajah Doudna DOB: [birth year] MRN: [id number]   Date of Service  09/14/2019  HPI/Events of Note  58 F HTN, DM, HFpEF, schizoaffective disorder, CKD presented initially to [facility] for 1/21 for LE edema in acute on CKD. Trial of diuresis initially but eventually intubated for volume overload and altered sensorium from hypercapneic and hypoxic respiratory failure.  eICU Interventions   Patient now on dialysisi since 1/26 transferred for tunneled cath placement  Daily SBT     Intervention Category Major Interventions: Acute renal failure - evaluation and management;Respiratory failure - evaluation and management Minor Interventions: Agitation / anxiety - evaluation and management Evaluation Type: New Patient Evaluation  Judd Lien 09/14/2019, 9:46 PM

## 2019-09-14 NOTE — Progress Notes (Signed)
[facility]  CXR without pneumothorax after aborted dialysis catheter. Worsening infilatrates on right from prior exam. ET in place.   Curlene Labrum, MD [facility] [address] [place], [place] [address] [id number] [phone] (office)

## 2019-09-14 NOTE — Progress Notes (Signed)
PROGRESS NOTE    [name]  NUU:[id number] DOB: 22-Sep-1950 DOA: 09/06/2019 PCP: Glenda Chroman, MD    Brief Narrative:  69 year old female with history of hypertension, diabetes, diastolic heart failure, schizoaffective disorder, chronic kidney disease stage IV, who presented to the emergency room with acute kidney injury on chronic kidney disease and creatinine of 6.3.  She was noted to be volume overload.  She is admitted to the hospital, started on intravenous Lasix and is being followed by nephrology.  She has not had any significant improvement in renal function and is felt that she will likely need hemodialysis.  Plans were to start hemodialysis on 1/25.  On 1/23, patient had a sudden change in mental status.  She had increased respiratory effort and blood gas showed respiratory acidosis.  Patient was transferred to the ICU and was intubated.  Further work-up for mental status change including CT head has been ordered. -Initiated on HD on 09/11/2019 -Intubated for respiratory acidosis/hypoxia and worsening mental status.   -Transfer to [facility] under PCCM service with vascular surgery consult for permanent HD access and nephrology to continue hemodialysis to address volume overload status -No plans to extubate until tunneled HD catheter is placed and volume status improves enough to come off vent   Assessment & Plan:   Principal Problem:   Acute kidney injury superimposed on chronic kidney disease ([place]) Active Problems:   Volume overload   SVT (supraventricular tachycardia) (HCC)   Type 2 diabetes mellitus with diabetic polyneuropathy, without long-term current use of insulin (HCC)   Normocytic anemia   Schizophrenia (HCC)   Depression   Essential hypertension   Chronic diastolic heart failure (HCC)   CKD (chronic kidney disease), stage IV (HCC)   Obesity, Class III, BMI 40-49.9 (morbid obesity) ([place])   Type 2 diabetes mellitus ([place])   Hypothyroidism   On mechanically  assisted ventilation (HCC)   Acute metabolic encephalopathy   Acute respiratory failure with hypoxia and hypercapnia (HCC)   Acute pulmonary edema (HCC)    1)Acute respiratory failure with hypoxia and hypercapnia--multifactorial etiology including right-sided pneumonia and volume overload in the setting of ESRD-Intubated 09/08/2019  -Did well with CPAP trial again today--per weaning protocol -PCCM consult appreciated -Weaned off fentanyl drip, not really using as needed IV fentanyl much  -Stop Levaquin -STarted on Vanco meropenem on 09/14/2019 -CTA chest without PE, has bilateral pleural effusions -Initiated on hemodialysis on 09/11/2019 Intubated for respiratory acidosis/hypoxia and worsening mental status.   -Transfer to [facility]  Intubated under PCCM service with vascular surgery consult for permanent HD access and nephrology to continue hemodialysis to address volume overload status -No plans to extubate until tunneled HD catheter is placed and volume status improves enough to come off vent  2)AKI----acute kidney injury on CKD stage -IV Versus Progression towards CKD 5,GFR is now less than 10, -had uremic symptoms prior to intubation --Elevated creatinine noted, creatinine was 3.1 on 06/11/2019 and creatinine was 2.1 on 10/09/2018 -renal ultrasound without obstructive uropathy there is evidence of bilateral medical renal disease -Nephrology consult appreciated - patient was agreeable to undergo hemodialysis prior to intubation -Levaquin, PTA torsemide,  continues on Lasix 80 mg twice daily -renally adjust medications, avoid nephrotoxic agents / dehydration / hypotension -PTH -263 -Had first hemodialysis session on 09/11/2019, again on 09/12/2019, next HD scheduled for 09/13/2019  3) PSVT----had episode of paroxysmal SVT on 09/10/2019 responded well to IV adenosine, continue metoprolol as ordered  4)HFpEF--last known EF 60 to 65% based on echo  from January 2020, patient had  diastolic dysfunction -Continue IV Lasix and HD sessions to address volume status -Metoprolol as ordered  5)HTN-stage II--amlodipine discontinued due to edema and to allow room to titrate diuretics, discontinue prazosin,  -Metoprolol as ordered  6)DM2with peripheral neuropathy--A1c is 5.2 reflecting excellent diabetic control PTA -PTA patient was on Tradjenta,Trulicity discontinued, and Lantus insulin  -Allow some permissive Hyperglycemia rather than risk life-threatening hypoglycemia in a patient with unreliable oral intake. Use Novolog/Humalog Sliding scale insulin with Accu-Cheks/Fingersticks as ordered  7)Hypothyroidism--continue levothyroxine 25 mcg daily,TSH was 2.9 in April 2020,  8))Neuropsychiatric disorders---PTA patient hadunderlying schizoaffective disorder, as well as questionable seizure attacks in the past, followed by [name] as her neurologist,who does not believe the patient has genuine seizures---PTA was on Lamictal for mood stabilization, trazodone 50 mg nightly,,Risperdal 0.5 mg twice daily, vitamin B6, Mysoline 50 mg daily, and as needed lorazepam  7)Morbid obesity/OSA---PTA was on CPAP nightly , currently intubated  8)Anemia of CKD---ESA/EPO agent per nephrology team, ferritin 19 and TIBC 241 and serum iron is 62,  9)FEN--vital 1.2 tube feeding at 40 mL an hour  with water flushes through OG tube, Accu-Cheks as ordered -Pro stat protein supplement 30 mL twice daily  Disposition/Need for in-Hospital Stay- -currently intubated for hypoxic and hypercapnic respiratory failure -Started on hemodialysis this admission, will  Need clipping process prior to discharge -Transfer to [facility]  Intubated under PCCM service with vascular surgery consult for permanent HD access and nephrology to continue hemodialysis to address volume overload status -No plans to extubate until tunneled HD catheter is placed and volume status improves enough to come  off vent  Code Status : Full  Family Communication:   na  Consults  :  Nephrology/general surgery/PCCM  DVT Prophylaxis  :   - Heparin - SCDs  CRITICAL CARE Performed by: Roxan Hockey  Total critical care time: 41 minutes  Critical care time was exclusive of separately billable procedures and treating other patients.  Critical care was necessary to treat or prevent imminent or life-threatening deterioration. -Continues to do well with CPAP trial and weaning protocol  -To remain intubated -to allow for placement of tunneled HD catheter  Critical care was time spent personally by me on the following activities: development of treatment plan with patient and/or surrogate as well as nursing, discussions with consultants, evaluation of patient's response to treatment, examination of patient, obtaining history from patient or surrogate, ordering and performing treatments and interventions, ordering and review of laboratory studies, ordering and review of radiographic studies, pulse oximetry and re-evaluation of patient's condition.  DVT prophylaxis: SCDs Code Status: Full code Family Communication: Patient is a resident of a group home.  She does not have any family.  Contacted her emergency contact, [name] who is the administrator at the facility.  She said that patient does not have any designated power of attorney.   Disposition Plan:  -Clipping process started -Intubated for respiratory acidosis/hypoxia and worsening mental status.   -Transfer to [facility]  Intubated under PCCM service with vascular surgery consult for permanent HD access and nephrology to continue hemodialysis to address volume overload status -No plans to extubate until tunneled HD catheter is placed and volume status improves enough to come off vent   Consultants:   Nephrology/PCCM/Gen Surgery  Procedures:   ETT 1/23> 09/10/19-Temporary Dialysis line placement, left femoral under ultrasound  guidance   -Unsuccessful attempt at tunneled HD catheter on 09/14/2019  Antimicrobials:   Levaquin 1/24> Vancomycin and  meropenem started on 09/14/2019  Subjective:  -Tolerating hemodialysis on 09/13/19-  -Unsuccessful attempt at tunneled HD catheter on 09/14/2019  -Tolerating tube feeding well -Off fentanyl drip -Intubated for respiratory acidosis/hypoxia and worsening mental status.   -Transfer to [facility]  Intubated under PCCM service with vascular surgery consult for permanent HD access and nephrology to continue hemodialysis to address volume overload status -No plans to extubate until tunneled HD catheter is placed and volume status improves enough to come off vent  Objective: Vitals:   09/14/19 1000 09/14/19 1030 09/14/19 1230 09/14/19 1300  BP: (!) 149/65 (!) 164/58 (!) 182/76 (!) 145/75  Pulse: 79 81 92 81  Resp: 20 18 (!) 22 (!) 22  Temp:   98 F (36.7 C)   TempSrc:      SpO2: 95% 95% (!) 88% 97%  Weight:      Height:        Intake/Output Summary (Last 24 hours) at 09/14/2019 1724 Last data filed at 09/14/2019 1130 Gross per 24 hour  Intake -  Output 3805 ml  Net -3805 ml   Filed Weights   09/13/19 0500 09/13/19 2220 09/14/19 0500  Weight: 96.5 kg 96.9 kg 93 kg    Examination:  General exam: intubated on ventilator, more awake HEENT-ET tube and OG tube Respiratory system: diminished breath sounds on right, crackles at bases.  . Cardiovascular system:RRR. No murmurs, rubs, gallops. Gastrointestinal system: Abdomen is nondistended, soft and   Normal bowel sounds heard. Central nervous system: .  Intubated, ?? Rt sided weakness Extremities: 2+ edema bilaterally upper and lower extremities Skin: No rashes, lesions or ulcers Psychiatry: unable to assess since she is on a vent MSK-left femoral HD catheter GU-Foley with scant amount of urine   Data Reviewed: I have personally reviewed following labs and imaging studies  CBC: Recent Labs  Lab 09/08/19  1544 09/09/19 0424 09/10/19 0406 09/13/19 0341  WBC 6.7 9.0 10.2 10.6*  HGB 8.7* 7.0* 7.4* 8.2*  HCT 26.3* 20.9* 22.6* 25.1*  MCV 100.4* 97.7 98.7 101.6*  PLT 171 155 126* 450   Basic Metabolic Panel: Recent Labs  Lab 09/10/19 0406 09/11/19 0412 09/12/19 0403 09/13/19 0341 09/14/19 0507  NA 139 141 138 138 137  K 3.7 3.6 3.3* 3.8 3.2*  CL 102 105 102 101 97*  CO2 '23 23 24 27 27  '$ GLUCOSE 129* 73 131* 169* 165*  BUN 69* 73* 58* 45* 32*  CREATININE 6.93* 7.50* 6.36* 4.61* 3.18*  CALCIUM 7.7* 7.7* 7.9* 8.1* 8.3*  MG 2.3  --   --   --   --   PHOS 6.5* 7.5* 6.5* 4.4 2.9   GFR: Estimated Creatinine Clearance: 18.2 mL/min (A) (by C-G formula based on SCr of 3.18 mg/dL (H)). Liver Function Tests: Recent Labs  Lab 09/10/19 0406 09/11/19 0412 09/12/19 0403 09/13/19 0341 09/14/19 0507  ALBUMIN 2.0* 2.0* 1.8* 2.0* 2.1*   No results for input(s): LIPASE, AMYLASE in the last 168 hours. No results for input(s): AMMONIA in the last 168 hours. Coagulation Profile: Recent Labs  Lab 09/09/19 1224  INR 1.1   Cardiac Enzymes: No results for input(s): CKTOTAL, CKMB, CKMBINDEX, TROPONINI in the last 168 hours. BNP (last 3 results) No results for input(s): PROBNP in the last 8760 hours. HbA1C: No results for input(s): HGBA1C in the last 72 hours. CBG: Recent Labs  Lab 09/14/19 0154 09/14/19 0439 09/14/19 0829 09/14/19 1350 09/14/19 1627  GLUCAP 148* 148* 156* 169* 147*   Lipid Profile: No  results for input(s): CHOL, HDL, LDLCALC, TRIG, CHOLHDL, LDLDIRECT in the last 72 hours. Thyroid Function Tests: No results for input(s): TSH, T4TOTAL, FREET4, T3FREE, THYROIDAB in the last 72 hours. Anemia Panel: No results for input(s): VITAMINB12, FOLATE, FERRITIN, TIBC, IRON, RETICCTPCT in the last 72 hours. Sepsis Labs: No results for input(s): PROCALCITON, LATICACIDVEN in the last 168 hours.  Recent Results (from the past 240 hour(s))  SARS CORONAVIRUS 2 (TAT 6-24 HRS)  Nasopharyngeal Nasopharyngeal Swab     Status: None   Collection Time: 09/06/19  3:43 AM   Specimen: Nasopharyngeal Swab  Result Value Ref Range Status   SARS Coronavirus 2 NEGATIVE NEGATIVE Final    Comment: (NOTE) SARS-CoV-2 target nucleic acids are NOT DETECTED. The SARS-CoV-2 RNA is generally detectable in upper and lower respiratory specimens during the acute phase of infection. Negative results do not preclude SARS-CoV-2 infection, do not rule out co-infections with other pathogens, and should not be used as the sole basis for treatment or other patient management decisions. Negative results must be combined with clinical observations, patient history, and epidemiological information. The expected result is Negative. Fact Sheet for Patients: SugarRoll.be Fact Sheet for Healthcare Providers: https://www.woods-mathews.com/ This test is not yet approved or cleared by the [place] FDA and  has been authorized for detection and/or diagnosis of SARS-CoV-2 by FDA under an Emergency Use Authorization (EUA). This EUA will remain  in effect (meaning this test can be used) for the duration of the COVID-19 declaration under Section 56 4(b)(1) of the Act, 21 U.S.C. section 360bbb-3(b)(1), unless the authorization is terminated or revoked sooner. Performed at [facility], [place] [address] [place], [place] [address]   MRSA PCR Screening     Status: None   Collection Time: 09/08/19 12:30 PM   Specimen: Nasal Mucosa; Nasopharyngeal  Result Value Ref Range Status   MRSA by PCR NEGATIVE NEGATIVE Final    Comment:        The GeneXpert MRSA Assay (FDA approved for NASAL specimens only), is one component of a comprehensive MRSA colonization surveillance program. It is not intended to diagnose MRSA infection nor to guide or monitor treatment for MRSA infections. Performed at [facility], [address], [place], [place] [address]      Radiology Studies: [facility] Chest [facility] 1 View  Result Date: 09/14/2019 CLINICAL DATA:  Attempted placement of dialysis catheter. EXAM: PORTABLE CHEST 1 VIEW COMPARISON:  One-view chest x-ray 09/13/2019 FINDINGS: The heart is enlarged. Patient remains intubated. NG tube terminates in the stomach. Right pleural effusion is noted within the right minor fissure and at the apex, likely redistributed due to positioning. Increased right upper lobe airspace disease is present. The right base is clear. The left lung is mostly clear. IMPRESSION: 1. Increased right upper lobe airspace disease concerning for pneumonia. 2. Small right pleural effusion. 3. Cardiomegaly without failure. Electronically Signed   By: San Morelle M.D.   On: 09/14/2019 12:33   DG Chest Port 1 View  Result Date: 09/13/2019 CLINICAL DATA:  Respiratory failure with hypoxemia. EXAM: PORTABLE CHEST 1 VIEW COMPARISON:  Chest radiograph 09/10/2019, chest CT 09/08/2019 FINDINGS: ET tube is unchanged position with tip terminating at the level of the clavicular heads. An enteric tube passes below level of left hemidiaphragm with tip excluded from the field of view. Overlying cardiac monitoring leads. Unchanged cardiomediastinal silhouette with cardiomegaly. Right greater than left pleural effusions and bibasilar atelectasis, similar to prior examination. No evidence of pneumothorax. IMPRESSION: Support apparatus as described.  Similar appearance of right greater than left pleural effusions and bibasilar atelectasis. Pneumonia at the lung bases cannot be excluded. Unchanged cardiomegaly. Electronically Signed   By: Kellie Simmering DO   On: 09/13/2019 07:13   DG C-Arm 1-60 Min-No Report  Result Date: 09/14/2019 Fluoroscopy was utilized by the requesting physician.  No radiographic interpretation.   Scheduled Meds: . acetaminophen  325 mg Per NG tube BID  . acetylcysteine  2 mL Nebulization QID  . albuterol  2.5 mg Nebulization QID  . aspirin  81  mg Per NG tube q morning - 10a  . atorvastatin  20 mg Per NG tube q1800  . calcitRIOL  0.25 mcg Per NG tube Q M,W,F  . chlorhexidine gluconate (MEDLINE KIT)  15 mL Mouth Rinse BID  . Chlorhexidine Gluconate Cloth  6 each Topical Daily  . Chlorhexidine Gluconate Cloth  6 each Topical Q0600  . cholecalciferol  4,000 Units Per NG tube q morning - 10a  . darbepoetin (ARANESP) injection - NON-DIALYSIS  150 mcg Subcutaneous Q Thu-1800  . feeding supplement (PRO-STAT SUGAR FREE 64)  30 mL Per Tube BID  . free water  60 mL Per Tube Q4H  . furosemide  80 mg Intravenous Q12H  . heparin injection (subcutaneous)  5,000 Units Subcutaneous Q8H  . influenza vaccine adjuvanted  0.5 mL Intramuscular Tomorrow-1000  . insulin aspart  0-6 Units Subcutaneous Q4H  . insulin glargine  16 Units Subcutaneous QHS  . levothyroxine  25 mcg Per NG tube Q0600  . mouth rinse  15 mL Mouth Rinse 10 times per day  . metoprolol tartrate  12.5 mg Per Tube BID  . montelukast  10 mg Per Tube QHS  . multivitamin  15 mL Per Tube Daily  . neomycin-bacitracin-polymyxin  1 application Topical Daily  . pantoprazole (PROTONIX) IV  40 mg Intravenous Daily  . primidone  50 mg Per NG tube q morning - 10a  . pyridOXINE  200 mg Per NG tube BID  . risperiDONE  0.5 mg Per NG tube BID  . sodium chloride flush  3 mL Intravenous Q12H  . traZODone  50 mg Per NG tube Daily  . vancomycin variable dose per unstable renal function (pharmacist dosing)   Does not apply See admin instructions   Continuous Infusions: . sodium chloride    . sodium chloride    . sodium chloride    . feeding supplement (VITAL AF 1.2 CAL) 1,000 mL (09/14/19 1625)  . meropenem (MERREM) IV    . vancomycin 2,000 mg (09/14/19 1630)   Intubated for respiratory acidosis/hypoxia and worsening mental status.   -Transfer to [facility]  Intubated under PCCM service with vascular surgery consult for permanent HD access and nephrology to continue hemodialysis to address  volume overload status -No plans to extubate until tunneled HD catheter is placed and volume status improves enough to come off vent   LOS: 8 days   Roxan Hockey, MD [facility]  If 7PM-7AM, please contact night-coverage [email or web address]  09/14/2019, 5:24 PM

## 2019-09-14 NOTE — Progress Notes (Signed)
MD made aware morning meds not given due to procedure and pt vomited while being suctioned with RT. No new orders. Will continue to monitor.

## 2019-09-14 NOTE — Progress Notes (Addendum)
PULMONARY / CRITICAL CARE MEDICINE   NAME:  [name], MRN:  [id number], DOB:  [birth year], LOS: 8 ADMISSION DATE:  09/06/2019, CONSULTATION DATE:  1/25 REFERRING MD:  [facility]  CHIEF COMPLAINT:  resp failure   BRIEF HISTORY:     48 yowf remote smoker carries dx of COPD/AB on pred ? How long plus just  saba admitted with progressive wt gain, leg swelling sob with w/u c/w anasarca / bilateral effusions  more sob 1/23 and required intubation / mech with CTa neg PE, positive for mild bilateral pleural effusions with small lung voumes and PCCM asked so see 1/25    HISTORY OF PRESENT ILLNESS   No direct hx availabe but from H/P:  69 y.o. female   HTN, DM (on trulicity), hyperlipidemia, diastolic heart failure, schizoaffective D/O with episodes of catatonia and sz d/o- on primidone/cymbalta and risperdone. She also seems to have CKD- crt in the low to mid 2's in early 2020- then a crt in the system from October 2020 that was 3, followed by [name] as her neurologist who does not believe the patient has frank seizures --Presented to the ED with worsening shortness of breath, worsening lower extremity edema and decreased urine output despite reported  compliance with diuretics at home -- In the ED chest x-rays consistent with CHF -Creatinine admit  up to 6.34 creatinine was 3.1 on 06/11/2019 and creatinine was 2.1 on 10/09/2018 SIGNIFICANT PAST MEDICAL HISTORY    . Anxiety   . Asthma   . Back pain   . Barrett esophagus   . CHF (congestive heart failure) ([place])   . CKD (chronic kidney disease) stage 3, GFR 30-59 ml/min   . COPD (chronic obstructive pulmonary disease) ([place])   . Depression   . Diastolic heart failure ([place])   . Essential hypertension   . Fibromyalgia   . GERD (gastroesophageal reflux disease)   . Gout   . History of pericarditis   . Hyperlipidemia   . Hypothyroid   . Major depressive disorder   . Memory loss   . Nephrolithiasis    2008  .  Obesity hypoventilation syndrome (HCC)    CPAP  . Obstructive sleep apnea   . PE (pulmonary embolism)    2010  . Peripheral neuropathy   . PTSD (post-traumatic stress disorder)   . Rheumatoid arthritis ([place])   . Schizophrenia ([place])   . 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 Steroid dependence ([place])   . Type 2 diabetes mellitus ([place])   . Vitamin D deficiency    . APPENDECTOMY    . CHOLECYSTECTOMY    . COLONOSCOPY     benign polyps (12/2000), repeat colonoscopy performed in 2007  . ENDOMETRIAL BIOPSY     10/03 benign columnar mucosa (limited material)  . KIDNEY SURGERY       SIGNIFICANT EVENTS:  Respiratory  1/23 4pm resp distress on floor, rapid response > intubated and transferred to ICU Started HD 1/26  STUDIES:   CTa  Chest 1/23 1. No CT evidence of acute pulmonary embolism. 2. Moderate severity left upper lobe and right lower lobe atelectasis and/or infiltrate with marked severity consolidation seen throughout the entire right lower lobe. 3. Moderate size bilateral pleural effusions, right greater than Left. CT Head 1/23 no acute cahnge CT head 1/26 (? L side neglect) > no acute changes   CULTURES:  Covid  1/21 neg MRSA pcr  1/23 neg  ET 1/29 >>>  ANTIBIOTICS:  Levaquin 1/24 >>> 1/29  Zosyn 1/29 >>> Vanc 1/29 only   LINES/TUBES:  Oral ET  1/23 L Fem HD 1/25    CONSULTANTS:  Nephrology 1/21 PCCM 1/25   Scheduled Meds: . acetaminophen  325 mg Per NG tube BID  . aspirin  81 mg Per NG tube q morning - 10a  . atorvastatin  20 mg Per NG tube q1800  . calcitRIOL  0.25 mcg Per NG tube Q M,W,F  . chlorhexidine gluconate (MEDLINE KIT)  15 mL Mouth Rinse BID  . Chlorhexidine Gluconate Cloth  6 each Topical Daily  . Chlorhexidine Gluconate Cloth  6 each Topical Q0600  . cholecalciferol  4,000 Units Per NG tube q morning - 10a  . darbepoetin (ARANESP) injection - NON-DIALYSIS  150 mcg Subcutaneous Q Thu-1800  . feeding supplement  (PRO-STAT SUGAR FREE 64)  30 mL Per Tube BID  . free water  60 mL Per Tube Q4H  . furosemide  80 mg Intravenous Q12H  . heparin injection (subcutaneous)  5,000 Units Subcutaneous Q8H  . influenza vaccine adjuvanted  0.5 mL Intramuscular Tomorrow-1000  . insulin aspart  0-6 Units Subcutaneous Q4H  . insulin glargine  16 Units Subcutaneous QHS  . levothyroxine  25 mcg Per NG tube Q0600  . mouth rinse  15 mL Mouth Rinse 10 times per day  . metoprolol tartrate  12.5 mg Per Tube BID  . montelukast  10 mg Per Tube QHS  . multivitamin  15 mL Per Tube Daily  . neomycin-bacitracin-polymyxin  1 application Topical Daily  . pantoprazole (PROTONIX) IV  40 mg Intravenous Daily  . primidone  50 mg Per NG tube q morning - 10a  . pyridOXINE  200 mg Per NG tube BID  . risperiDONE  0.5 mg Per NG tube BID  . sodium chloride flush  3 mL Intravenous Q12H  . traZODone  50 mg Per NG tube Daily   Continuous Infusions: . sodium chloride    . sodium chloride    . sodium chloride    . feeding supplement (VITAL AF 1.2 CAL) Stopped (09/14/19 0551)  . levofloxacin (LEVAQUIN) IV 500 mg (09/13/19 0944)   PRN Meds:.sodium chloride, sodium chloride, sodium chloride, acetaminophen **OR** acetaminophen, albuterol, albuterol, alteplase, fentaNYL (SUBLIMAZE) injection, heparin, heparin, hydrOXYzine, labetalol, LORazepam, metoprolol tartrate, midazolam, ondansetron **OR** ondansetron (ZOFRAN) IV, polyethylene glycol, sodium chloride flush   SUBJECTIVE/interim:  Unable to thread HD in or, back to ICU with episode of green vomit/ secretions thick yellow   CONSTITUTIONAL: BP (!) 182/76   Pulse 92   Temp 99.9 F (37.7 C) (Axillary)   Resp (!) 22   Ht 5' 2" (1.575 m)   Wt 93 kg   SpO2 (!) 88%   BMI 37.50 kg/m   I/O last 3 completed shifts: In: 100 [IV Piggyback:100] Out: 8366 [Urine:1750; Other:5000]     Vent Mode: PRVC FiO2 (%):  [30 %] 30 % Set Rate:  [20 bmp] 20 bmp Vt Set:  [400 mL] 400 mL PEEP:  [5  cmH20] 5 cmH20 Pressure Support:  [8 cmH20] 8 cmH20 Plateau Pressure:  [14 cmH20-19 cmH20] 14 cmH20  PHYSICAL EXAM: General:  Obese wf, looks uncomfortable on vent  Neuro:  F/c all 4 HEENT: Oral ET  Cardiovascular:RRR no s3 trace pitting both ext  Lungs: very distant bs s wheeze  Abdomen obese/ soft / was tol tf prior to OR  Skin: no rash/ skin breakdown     pCXR  1/29 1. Increased right upper lobe airspace disease concerning  for pneumonia. 2. Small right pleural effusion. 3. Cardiomegaly without failure.     RESOLVED PROBLEM LIST   ASSESSMENT AND PLAN    1) Acute resp failure hypoxemic and hypercarbic present on admit  in setting of vol overload, acute renal failure and anasarca/pleural effusions made worse by low albumin  - plan is to keep on vent pending transfer to [facility] surgery for HD catheter   2) Acute renal failure/ low albumin > bilateral pleural effusions. >>>  Better p daily  HD started 1/26  >> continue to  keep as dry as tol to help weaning    3) Acute metabolic encephalopathy secondary to hypercarbia, present on admit. -  Much more alert since decreased sedation and started HD  4) Normocytic anemia typical of renal failure > rx per [facility] / renal    Lab Results  Component Value Date   HGB 8.2 (L) 09/13/2019   HGB 7.4 (L) 09/10/2019   HGB 7.0 (L) 09/09/2019  trending better, continue to monitor    5) Thrombocytopnia  on Hep Lab Results  Component Value Date   PLT 174 09/13/2019   PLT 126 (L) 09/10/2019   PLT 155 09/09/2019   trending better, continue to monitor   6) New densities RUL ? Atx vs  HCAP ? Asp > change to vanc/zosyn p Et for gm stain and culture         SUMMARY OF TODAY'S PLAN:  Remain on vent pending transfer to [facility]/ rx as HCAP  Best Practice / Goals of Care / Disposition.   DVT PROPHYLAXIS:  Sub Q hep SUP: PPI NUTRITION:per [facility]  MOBILITY:as tol  GOALS OF CARE: per [facility]  FAMILY DISCUSSIONS: per [facility]   DISPOSITION remain ICU/ on Vent and transfer to [facility] when bed available   LABS  Glucose Recent Labs  Lab 09/13/19 1119 09/13/19 1521 09/13/19 1943 09/14/19 0154 09/14/19 0439 09/14/19 0829  GLUCAP 191* 170* 151* 148* 148* 156*    BMET Recent Labs  Lab 09/12/19 0403 09/13/19 0341 09/14/19 0507  NA 138 138 137  K 3.3* 3.8 3.2*  CL 102 101 97*  CO2 _0 BUN 58* 45* 32*  CREATININE 6.36* 4.61* 3.18*  GLUCOSE 131* 169* 165*    Liver Enzymes Recent Labs  Lab 09/12/19 0403 09/13/19 0341 09/14/19 0507  ALBUMIN 1.8* 2.0* 2.1*    Electrolytes Recent Labs  Lab 09/10/19 0406 09/11/19 0412 09/12/19 0403 09/13/19 0341 09/14/19 0507  CALCIUM 7.7*   < > 7.9* 8.1* 8.3*  MG 2.3  --   --   --   --   PHOS 6.5*   < > 6.5* 4.4 2.9   < > = values in this interval not displayed.    CBC Recent Labs  Lab 09/09/19 0424 09/10/19 0406 09/13/19 0341  WBC 9.0 10.2 10.6*  HGB 7.0* 7.4* 8.2*  HCT 20.9* 22.6* 25.1*  PLT 155 126* 174    ABG Recent Labs  Lab 09/08/19 1755 09/09/19 0424 09/10/19 0409  PHART 7.363 7.420 7.412  PCO2ART 46.7 40.8 41.0  PO2ART 77.8* 65.6* 66.0*    Coag's Recent Labs  Lab 09/09/19 1224  INR 1.1    Sepsis Markers No results for input(s): LATICACIDVEN, PROCALCITON, O2SATVEN in the last 168 hours.  Cardiac Enzymes No results for input(s): TROPONINI, PROBNP in the last 168 hours.    Total time today 35 minutes   Christinia Gully, MD Pulmonary and [place] [facility] [phone]  After 5:30 PM or weekends, use [facility] [phone]

## 2019-09-14 NOTE — Interval H&P Note (Signed)
History and Physical Interval Note:  09/14/2019 10:24 AM  [name]  has presented today for surgery, with the diagnosis of esrd.  The various methods of treatment have been discussed with the patient and family. After consideration of risks, benefits and other options for treatment, the patient has consented to  Procedure(s): INSERTION OF DIALYSIS CATHETER (Right) as a surgical intervention.  The patient's history has been reviewed, patient examined, no change in status, stable for surgery.  I have reviewed the patient's chart and labs.  Questions were answered to the patient's satisfaction.    Patient awake and appropriate, following commands. Able to give thumbs up and wiggle toes. Vitals stable. Plan for tunneled catheter today and back to ICU for them to extubate.   Virl Cagey

## 2019-09-15 ENCOUNTER — Inpatient Hospital Stay (HOSPITAL_COMMUNITY): Payer: Medicare Other

## 2019-09-15 DIAGNOSIS — R0602 Shortness of breath: Secondary | ICD-10-CM | POA: Diagnosis not present

## 2019-09-15 DIAGNOSIS — J9601 Acute respiratory failure with hypoxia: Secondary | ICD-10-CM | POA: Diagnosis not present

## 2019-09-15 DIAGNOSIS — E8779 Other fluid overload: Secondary | ICD-10-CM | POA: Diagnosis not present

## 2019-09-15 DIAGNOSIS — G9341 Metabolic encephalopathy: Secondary | ICD-10-CM | POA: Diagnosis not present

## 2019-09-15 DIAGNOSIS — N179 Acute kidney failure, unspecified: Secondary | ICD-10-CM | POA: Diagnosis not present

## 2019-09-15 DIAGNOSIS — J810 Acute pulmonary edema: Secondary | ICD-10-CM | POA: Diagnosis not present

## 2019-09-15 LAB — GLUCOSE, CAPILLARY
Glucose-Capillary: 112 mg/dL — ABNORMAL HIGH (ref 70–99)
Glucose-Capillary: 145 mg/dL — ABNORMAL HIGH (ref 70–99)
Glucose-Capillary: 180 mg/dL — ABNORMAL HIGH (ref 70–99)
Glucose-Capillary: 171 mg/dL — ABNORMAL HIGH (ref 70–99)

## 2019-09-15 MED ORDER — RACEPINEPHRINE HCL 2.25 % IN NEBU
0.5000 mL | INHALATION_SOLUTION | Freq: Once | RESPIRATORY_TRACT | Status: AC
  Administered 2019-09-15: 0.5 mL via RESPIRATORY_TRACT
  Filled 2019-09-15: qty 0.5

## 2019-09-15 MED ORDER — LORAZEPAM 2 MG/ML IJ SOLN
1.0000 mg | INTRAMUSCULAR | Status: DC
  Administered 2019-09-16: 1 mg via INTRAVENOUS
  Filled 2019-09-15: qty 1

## 2019-09-15 MED ORDER — RACEPINEPHRINE HCL 2.25 % IN NEBU
INHALATION_SOLUTION | RESPIRATORY_TRACT | Status: AC
  Administered 2019-09-15: 0.5 mL via RESPIRATORY_TRACT
  Filled 2019-09-15: qty 0.5

## 2019-09-15 MED ORDER — SODIUM CHLORIDE 0.9 % IV SOLN
INTRAVENOUS | Status: DC | PRN
  Administered 2019-09-15: 250 mL via INTRAVENOUS
  Administered 2019-09-16: 500 mL via INTRAVENOUS

## 2019-09-15 MED ORDER — POTASSIUM CHLORIDE 10 MEQ/100ML IV SOLN
10.0000 meq | INTRAVENOUS | Status: AC
  Administered 2019-09-15 (×4): 10 meq via INTRAVENOUS
  Filled 2019-09-15 (×4): qty 100

## 2019-09-15 MED ORDER — HYDROCORTISONE NA SUCCINATE PF 100 MG IJ SOLR
50.0000 mg | Freq: Four times a day (QID) | INTRAMUSCULAR | Status: AC
  Administered 2019-09-15 – 2019-09-17 (×11): 50 mg via INTRAVENOUS
  Filled 2019-09-15 (×11): qty 2

## 2019-09-15 MED ORDER — RACEPINEPHRINE HCL 2.25 % IN NEBU
0.5000 mL | INHALATION_SOLUTION | RESPIRATORY_TRACT | Status: DC
  Administered 2019-09-15 – 2019-09-16 (×5): 0.5 mL via RESPIRATORY_TRACT
  Filled 2019-09-15 (×5): qty 0.5

## 2019-09-15 MED ORDER — METHYLPREDNISOLONE SODIUM SUCC 125 MG IJ SOLR
60.0000 mg | Freq: Four times a day (QID) | INTRAMUSCULAR | Status: AC
  Administered 2019-09-15 – 2019-09-17 (×8): 60 mg via INTRAVENOUS
  Filled 2019-09-15 (×9): qty 2

## 2019-09-15 MED ORDER — FAMOTIDINE IN NACL 20-0.9 MG/50ML-% IV SOLN
20.0000 mg | Freq: Two times a day (BID) | INTRAVENOUS | Status: DC
  Administered 2019-09-16: 20 mg via INTRAVENOUS
  Filled 2019-09-15: qty 50

## 2019-09-15 NOTE — Consult Note (Signed)
Hospital Consult    Reason for Consult: Need for dialysis access Referring Physician: Dr. Nelda Marseille MRN #:  [id number]  History of Present Illness: This is a 69 y.o. female here with acute on chronic kidney injury.  Patient was admitted with volume overload and attempt at diuresis was initially undertaken.  She subsequently required intubation with need for dialysis for respiratory acidosis.  At this time she was also noted to have a facial droop and neurology was consulted.  A left femoral catheter was placed dialysis was initiated.  She had an attempted right internal jugular tunneled catheter placement but the wire would not pass centrally.  Consult is now for permanent dialysis access with tunneled catheter placement.  Patient cannot answer questions about dialysis.  Currently she states that she is short of breath and constipated.  She is ambidextrous.  She does not take blood thinners.  Past Medical History:  Diagnosis Date  . Anxiety   . Asthma   . Back pain   . Barrett esophagus   . CHF (congestive heart failure) ([place])   . CKD (chronic kidney disease) stage 3, GFR 30-59 ml/min   . COPD (chronic obstructive pulmonary disease) ([place])   . Depression   . Diastolic heart failure ([place])   . Essential hypertension   . Fibromyalgia   . GERD (gastroesophageal reflux disease)   . Gout   . History of pericarditis   . Hyperlipidemia   . Hypothyroid   . Major depressive disorder   . Memory loss   . Nephrolithiasis    2008  . Obesity hypoventilation syndrome (HCC)    CPAP  . Obstructive sleep apnea   . PE (pulmonary embolism)    2010  . Peripheral neuropathy   . PTSD (post-traumatic stress disorder)   . Rheumatoid arthritis ([place])   . Schizophrenia ([place])   . 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 Steroid dependence ([place])   . Type 2 diabetes mellitus ([place])   . Vitamin D deficiency     Past Surgical History:  Procedure Laterality Date  . APPENDECTOMY    . CHOLECYSTECTOMY     . COLONOSCOPY     benign polyps (12/2000), repeat colonoscopy performed in 2007  . ENDOMETRIAL BIOPSY     10/03 benign columnar mucosa (limited material)  . KIDNEY SURGERY      Allergies  Allergen Reactions  . Benztropine Mesylate Other (See Comments)    Alopecia and white spots on eyes  . Codeine Swelling, Palpitations and Other (See Comments)    Mouth Swelling and Tachycardia   . Diazepam Other (See Comments)    COMA  . Diphenhydramine Hcl Anxiety, Palpitations and Other (See Comments)    Tachycardia and anxiousness  . Penicillins Swelling and Other (See Comments)    REACTION: ulcers in mouth, swelling, throat swelling  Has patient had a PCN reaction causing immediate rash, facial/tongue/throat swelling, SOB or lightheadedness with hypotension: YES Has patient had a PCN reaction causing severe rash involving Mucus membranes or skin necrosis: Unknown Has patient had a PCN reaction that required hospitalization Unknown Has patient had a PCN reaction occurring within the last 10 years: unknown If all of the above answers are "NO", then may proceed with Cephalospori  . Sulfonamide Derivatives Rash    Blisters, also  . Thioridazine Hcl Swelling  . Lisinopril Cough    Prior to Admission medications   Medication Sig Start Date End Date Taking? Authorizing Provider  acetaminophen (TYLENOL) 325 MG tablet Take 325 mg by  mouth 2 (two) times daily.    Yes [provider]  albuterol (PROVENTIL) (2.5 MG/3ML) 0.083% nebulizer solution Take 2.5 mg by nebulization every 6 (six) hours as needed for wheezing or shortness of breath.   Yes [provider]  albuterol (VENTOLIN HFA) 108 (90 Base) MCG/ACT inhaler Inhale 1 puff into the lungs every 6 (six) hours as needed for wheezing or shortness of breath.   Yes [provider]  amLODipine (NORVASC) 5 MG tablet Take 1 tablet (5 mg total) by mouth daily. Patient taking differently: Take 5 mg by mouth every morning.  07/31/18  09/06/19 Yes Ghimire, Henreitta Leber, MD  aspirin 81 MG tablet Take 81 mg by mouth every morning.    Yes [provider]  atorvastatin (LIPITOR) 20 MG tablet Take 1 tablet by mouth at bedtime.  05/14/19  Yes [provider]  calcium carbonate (TUMS - DOSED IN MG ELEMENTAL CALCIUM) 500 MG chewable tablet Chew 1 tablet by mouth daily as needed for indigestion or heartburn.   Yes [provider]  carvedilol (COREG) 6.25 MG tablet Take 1 tablet (6.25 mg total) by mouth 2 (two) times daily with a meal. 08/18/18  Yes Lavina Hamman, MD  Cholecalciferol (VITAMIN D3) 50 MCG (2000 UT) TABS Take 4,000 Units by mouth every morning.    Yes [provider]  Dextromethorphan-guaiFENesin 10-100 MG/5ML liquid Take 5 mLs by mouth daily as needed (cough).   Yes [provider]  docusate sodium (COLACE) 100 MG capsule Take 1 capsule (100 mg total) by mouth 2 (two) times daily. Hold for diarrhea Patient taking differently: Take 100 mg by mouth daily as needed. Hold for diarrhea 11/29/18  Yes Barton Dubois, MD  Dulaglutide (TRULICITY) 1.5 0000000 SOPN Inject 1.5 mg into the skin every Friday.    Yes [provider]  DULoxetine (CYMBALTA) 60 MG capsule Take 1 capsule by mouth daily. 05/11/19  Yes [provider]  EPINEPHrine (EPI-PEN) 0.3 mg/0.3 mL DEVI Inject 0.3 mg into the muscle once as needed (for bee stings, then go to the ED immediately after using).    Yes [provider]  gabapentin (NEURONTIN) 300 MG capsule Take 1 capsule (300 mg total) by mouth 2 (two) times daily. Patient taking differently: Take 300 mg by mouth 2 (two) times daily. & 600 mg at bedtime 11/29/18  Yes Barton Dubois, MD  hydrOXYzine (ATARAX/VISTARIL) 25 MG tablet Take 25 mg by mouth 2 (two) times daily as needed for anxiety.    Yes [provider]  insulin degludec (TRESIBA FLEXTOUCH) 100 UNIT/ML SOPN FlexTouch Pen Inject 18 Units into the skin at bedtime.    Yes [provider]  levofloxacin (LEVAQUIN) 500 MG tablet Take 500 mg by mouth daily. 09/04/19  Yes [provider]  levothyroxine (SYNTHROID, LEVOTHROID) 25 MCG tablet Take 1 tablet (25 mcg total) by mouth daily at 6 (six) AM. 08/19/18  Yes Lavina Hamman, MD  linagliptin (TRADJENTA) 5 MG TABS tablet Take 5 mg by mouth every morning.    Yes [provider]  LORazepam (ATIVAN) 1 MG tablet Take 1 mg by mouth daily as needed for anxiety.   Yes [provider]  montelukast (SINGULAIR) 10 MG tablet Take 10 mg by mouth at bedtime.   Yes [provider]  Multiple Vitamin (DAILY VITE PO) Take 1 tablet by mouth every morning.    Yes [provider]  omeprazole (PRILOSEC) 20 MG capsule Take 20 mg by mouth every morning.  Yes [provider]  prazosin (MINIPRESS) 2 MG capsule Take 1 capsule by mouth at bedtime. 05/11/19  Yes [provider]  predniSONE (DELTASONE) 10 MG tablet Take 10 mg by mouth at bedtime.   Yes [provider]  primidone (MYSOLINE) 50 MG tablet Take 50 mg by mouth every morning.   Yes [provider]  pyridOXINE (VITAMIN B-6) 100 MG tablet Take 200 mg by mouth 2 (two) times daily.   Yes [provider]  risperiDONE (RISPERDAL) 0.5 MG tablet Take 1 tablet by mouth 2 (two) times daily. 05/11/19  Yes [provider]  sodium phosphate (FLEET) 7-19 GM/118ML ENEM Place 1 enema rectally daily as needed for mild constipation or severe constipation.   Yes [provider]  torsemide (DEMADEX) 20 MG tablet TAKE 3 TABLETS (60mg ) BY MOUTH EVERY OTHER DAY; ALTERNATING WITH 2 TABLETS (40mg ) BY MOUTH EVERY OTHER DAY. 05/14/19  Yes Strader, [place] M, PA-C  traMADol (ULTRAM) 50 MG tablet Take 1 tablet by mouth every 6 (six) hours as needed for moderate pain.  03/29/19  Yes [provider]  traZODone (DESYREL) 50 MG tablet Take 1 tablet by mouth daily. 05/11/19  Yes [provider]  Vitamins  A & D (VITAMIN A & D) ointment Apply 1 application topically See admin instructions. To be applied after baths in the morning due to rash under skin folds   Yes [provider]    Social History   Socioeconomic History  . Marital status: Single    Spouse name: Not on file  . Number of children: 0  . Years of education: Not on file  . Highest education level: 12th grade  Occupational History  . Not on file  Tobacco Use  . Smoking status: Former Smoker    Types: Cigarettes    Quit date: 06/23/1972    Years since quitting: 47.2  . Smokeless tobacco: Never Used  Substance and Sexual Activity  . Alcohol use: No  . Drug use: No  . Sexual activity: Never  Other Topics Concern  . Not on file  Social History Narrative   Right handed   Caffeine 1 cup per day    Lives at [place] Strain:   . Difficulty of Paying Living Expenses: Not on file  Food Insecurity:   . Worried About Charity fundraiser in the Last Year: Not on file  . Ran Out of Food in the Last Year: Not on file  Transportation Needs:   . Lack of Transportation (Medical): Not on file  . Lack of Transportation (Non-Medical): Not on file  Physical Activity:   . Days of Exercise per Week: Not on file  . Minutes of Exercise per Session: Not on file  Stress:   . Feeling of Stress : Not on file  Social Connections:   . Frequency of Communication with Friends and Family: Not on file  . Frequency of Social Gatherings with Friends and Family: Not on file  . Attends Religious Services: Not on file  . Active Member of Clubs or Organizations: Not on file  . Attends Archivist Meetings: Not on file  . Marital Status: Not on file  Intimate Partner Violence:   . Fear of Current or Ex-Partner: Not on file  . Emotionally Abused: Not on file  . Physically Abused: Not on file  . Sexually Abused: Not on file  Family History  Problem Relation Age of Onset  . Bone cancer Mother   . Hypertension Mother   . Heart disease Mother   . COPD Father   . Heart disease Father   . Stroke Father     ROS: Cardiovascular: []  chest pain/pressure []  palpitations [x]  SOB lying flat []  DOE []  pain in legs while walking []  pain in legs at rest []  pain in legs at night []  non-healing ulcers []  hx of DVT [x]  swelling in legs  Pulmonary: []  productive cough []  asthma/wheezing []  home O2  Neurologic: []  weakness in []  arms []  legs []  numbness in []  arms []  legs []  hx of CVA []  mini stroke [] difficulty speaking or slurred speech []  temporary loss of vision in one eye []  dizziness  Hematologic: []  hx of cancer []  bleeding problems []  problems with blood clotting easily  Endocrine:   []  diabetes []  thyroid disease  GI [x]  constipation []  blood in stool  GU: []  CKD/renal failure []  HD--[]  M/W/F or []  T/T/S []  burning with urination []  blood in urine  Psychiatric: []  anxiety []  depression  Musculoskeletal: []  arthritis []  joint pain  Integumentary: []  rashes []  ulcers  Constitutional: []  fever []  chills   Physical Examination  Vitals:   09/15/19 1145 09/15/19 1200  BP:  137/66  Pulse:  87  Resp:  (!) 27  Temp:    SpO2: 100% 94%   Body mass index is 38.59 kg/m.  General:  nad HENT: WNL, normocephalic Pulmonary: Mild difficulty speaking due to soa Cardiac: palpable radial pulses bilaterally Abdomen: soft, NT/ND, no masses Extremities:  Left femoral catheter Musculoskeletal: no muscle wasting or atrophy  Neurologic: A&O X 3  CBC    Component Value Date/Time   WBC 10.6 (H) 09/13/2019 0341   RBC 2.47 (L) 09/13/2019 0341   HGB 8.2 (L) 09/13/2019 0341   HCT 25.1 (L) 09/13/2019 0341   PLT 174 09/13/2019 0341   MCV 101.6 (H) 09/13/2019 0341   MCH 33.2 09/13/2019 0341   MCHC 32.7 09/13/2019 0341   RDW 14.3 09/13/2019 0341   LYMPHSABS 0.5 (L) 09/06/2019  0343   MONOABS 0.3 09/06/2019 0343   EOSABS 0.0 09/06/2019 0343   BASOSABS 0.0 09/06/2019 0343    BMET    Component Value Date/Time   NA 137 09/14/2019 0507   K 3.2 (L) 09/14/2019 0507   CL 97 (L) 09/14/2019 0507   CO2 27 09/14/2019 0507   GLUCOSE 165 (H) 09/14/2019 0507   BUN 32 (H) 09/14/2019 0507   CREATININE 3.18 (H) 09/14/2019 0507   CREATININE 2.54 (H) 08/02/2018 1411   CALCIUM 8.3 (L) 09/14/2019 0507   GFRNONAA 14 (L) 09/14/2019 0507   GFRAA 17 (L) 09/14/2019 0507    COAGS: Lab Results  Component Value Date   INR 1.1 09/09/2019   INR 1.06 09/15/2018   INR 0.91 07/27/2018     Non-Invasive Vascular Imaging:   Vein mapping ordered   ASSESSMENT/PLAN: This is a 69 y.o. female with acute on chronic kidney disease now on dialysis via temporary femoral catheter.  There was an attempt at right IJ tunneled dialysis catheter but wire would not pass.  We will get vein mapping for permanent access and plan to place tunneled dialysis catheter at the same time likely early this week.  Candee Hoon C. Donzetta Matters, MD [facility] Office: [phone] Pager: [phone]

## 2019-09-15 NOTE — Progress Notes (Signed)
SLP Cancellation Note  Patient Details Name: Kniyah Laforgia MRN: [id number] DOB: September 18, 1950   Cancelled treatment:       Reason Eval/Treat Not Completed: Patient not medically ready.  Per chart review, pt was recently extubated today at 11:13 and will therefore likely benefit from some time prior to bedside swallow evaluation.  SLP will f/u for BSE first thing tomorrow morning.     Elvia Collum Matti Minney 09/15/2019, 12:48 PM

## 2019-09-15 NOTE — Procedures (Signed)
Extubation Procedure Note  Patient Details:   Name: [name] DOB: 1950/10/22 MRN: [id number]   Airway Documentation:    Vent end date: 09/15/19 Vent end time: 1113   Evaluation  O2 sats: stable throughout Complications: No apparent complications Patient did tolerate procedure well. Bilateral Breath Sounds: Clear   Yes   Patient extubated to 4L nasal cannula per MD order.  Cuff leak was not present prior to extubation however MD was paged and came to patient room.  Instructed RT to extubate, while present in room, and once patient was extubated patient was not noted to have any stridor.  Patient was able to speak post extubation.  Sats currently 97%.  Vitals are stable.  No complications noted at this time.  Will continue to monitor.  Judith Part 09/15/2019, 11:17 AM

## 2019-09-15 NOTE — Progress Notes (Signed)
[facility] ROUNDING NOTE   Subjective:   This is a 69 year old lady with history of hypertension diabetes hyperlipidemia diastolic heart failure schizophrenia disorder with chronic kidney disease and the creatinine in the low to mid twos early 2020.  Her last creatinine in the system was 3 mg/dL October 2020.  She was admitted 09/06/2019 with progressive weight gain shortness of breath and lower extremity edema.  Serum creatinine 09/06/2019 was 6.34 mg/dL.  Urinalysis showed greater than 300 mg of protein with 0-5 RBCs and 21-50 WBCs per high-powered film.  Renal ultrasound was unremarkable with 8.2 cm kidneys on the right 9.4 cm kidney on the left with no evidence of mass or hydronephrosis.  She is received dialysis 09/12/2019 1 L removed, 09/13/2019 with 2 L removed on 09/14/2019 with 3 L removed.  Her weight is improved from 99.9 kg - 95.7 kg.  It appears that she has been followed without bradycardia in the past.  She is undergone dialysis through a left femoral dialysis catheter placed 09/10/2019 and is being transferred to [facility] for interventional radiology to place a tunneled dialysis catheter.  She also get consultation with vein and vascular surgery for placement of AV fistula/AV graft.  Pressure 111/56 pulse 70 temperature 97.5 O2 sats 99% FiO2 40%  Sodium 137 potassium 3.2 chloride 97 CO2 27 BUN 32 creatinine 3.18 glucose 165 phosphorus 2.9 albumin 2.1 WBC 10.6 hemoglobin 8.2 platelets 174  Aspirin 81 mg daily Lipitor 20 mg daily calcitriol 0.25 mcg Monday Wednesday Friday, vitamin D3 4000 units daily darbepoetin 150 mcg administered 09/06/2019 and 09/14/2019, levothyroxine 25 mcg daily, metoprolol 12.5 mg twice daily multivitamins 1 daily Singulair 10 mg daily Protonix 40 mg daily IV, primidone 50 mg daily pyridoxine 200 mg twice daily Risperdal 0.5 mg twice daily Transderm 50 mg daily  IV meropenem 500 mg every 24 hours IV vancomycin 2 g administered  09/14/2019   Objective:  Vital signs in last 24 hours:  Temp:  [97.5 F (36.4 C)-99.3 F (37.4 C)] 97.5 F (36.4 C) (01/30 0714) Pulse Rate:  [67-135] 70 (01/30 0735) Resp:  [15-22] 20 (01/30 0735) BP: (102-182)/(48-76) 111/56 (01/30 0735) SpO2:  [88 %-100 %] 100 % (01/30 0735) FiO2 (%):  [30 %-40 %] 40 % (01/30 0735) Weight:  [95.7 kg] 95.7 kg (01/30 0500)  Weight change: -1.2 kg Filed Weights   09/13/19 2220 09/14/19 0500 09/15/19 0500  Weight: 96.9 kg 93 kg 95.7 kg    Intake/Output: I/O last 3 completed shifts: In: 592.8 [I.V.:87; IV Piggyback:505.8] Out: 1610 [RUEAV:4098; Other:3000; Blood:5]   Intake/Output this shift:  No intake/output data recorded.  CVS- RRR no murmurs rubs or gallops RS- CTA no wheezes or rales ABD- BS present soft non-distended EXT-trace pitting edema   Basic Metabolic Panel: Recent Labs  Lab 09/10/19 0406 09/10/19 0406 09/11/19 0412 09/11/19 0412 09/12/19 0403 09/13/19 0341 09/14/19 0507  NA 139  --  141  --  138 138 137  K 3.7  --  3.6  --  3.3* 3.8 3.2*  CL 102  --  105  --  102 101 97*  CO2 23  --  23  --  '24 27 27  '$ GLUCOSE 129*  --  73  --  131* 169* 165*  BUN 69*  --  73*  --  58* 45* 32*  CREATININE 6.93*  --  7.50*  --  6.36* 4.61* 3.18*  CALCIUM 7.7*   < > 7.7*   < > 7.9* 8.1*  8.3*  MG 2.3  --   --   --   --   --   --   PHOS 6.5*  --  7.5*  --  6.5* 4.4 2.9   < > = values in this interval not displayed.    Liver Function Tests: Recent Labs  Lab 09/10/19 0406 09/11/19 0412 09/12/19 0403 09/13/19 0341 09/14/19 0507  ALBUMIN 2.0* 2.0* 1.8* 2.0* 2.1*   No results for input(s): LIPASE, AMYLASE in the last 168 hours. No results for input(s): AMMONIA in the last 168 hours.  CBC: Recent Labs  Lab 09/08/19 1544 09/09/19 0424 09/10/19 0406 09/13/19 0341  WBC 6.7 9.0 10.2 10.6*  HGB 8.7* 7.0* 7.4* 8.2*  HCT 26.3* 20.9* 22.6* 25.1*  MCV 100.4* 97.7 98.7 101.6*  PLT 171 155 126* 174    Cardiac Enzymes: No  results for input(s): CKTOTAL, CKMB, CKMBINDEX, TROPONINI in the last 168 hours.  BNP: Invalid input(s): POCBNP  CBG: Recent Labs  Lab 09/14/19 1627 09/14/19 1940 09/14/19 2120 09/14/19 2312 09/15/19 0319  GLUCAP 147* 129* 133* 137* 112*    Microbiology: Results for orders placed or performed during the hospital encounter of 09/06/19  SARS CORONAVIRUS 2 (TAT 6-24 HRS) Nasopharyngeal Nasopharyngeal Swab     Status: None   Collection Time: 09/06/19  3:43 AM   Specimen: Nasopharyngeal Swab  Result Value Ref Range Status   SARS Coronavirus 2 NEGATIVE NEGATIVE Final    Comment: (NOTE) SARS-CoV-2 target nucleic acids are NOT DETECTED. The SARS-CoV-2 RNA is generally detectable in upper and lower respiratory specimens during the acute phase of infection. Negative results do not preclude SARS-CoV-2 infection, do not rule out co-infections with other pathogens, and should not be used as the sole basis for treatment or other patient management decisions. Negative results must be combined with clinical observations, patient history, and epidemiological information. The expected result is Negative. Fact Sheet for Patients: SugarRoll.be Fact Sheet for Healthcare Providers: https://www.woods-mathews.com/ This test is not yet approved or cleared by the [place] FDA and  has been authorized for detection and/or diagnosis of SARS-CoV-2 by FDA under an Emergency Use Authorization (EUA). This EUA will remain  in effect (meaning this test can be used) for the duration of the COVID-19 declaration under Section 56 4(b)(1) of the Act, 21 U.S.C. section 360bbb-3(b)(1), unless the authorization is terminated or revoked sooner. Performed at [facility], [place] [address] [place], [place] [address]   MRSA PCR Screening     Status: None   Collection Time: 09/08/19 12:30 PM   Specimen: Nasal Mucosa; Nasopharyngeal  Result Value Ref Range Status    MRSA by PCR NEGATIVE NEGATIVE Final    Comment:        The GeneXpert MRSA Assay (FDA approved for NASAL specimens only), is one component of a comprehensive MRSA colonization surveillance program. It is not intended to diagnose MRSA infection nor to guide or monitor treatment for MRSA infections. Performed at [facility], [address], [place], [place] [address]   Culture, respiratory (non-expectorated)     Status: None (Preliminary result)   Collection Time: 09/14/19  1:00 PM   Specimen: Tracheal Aspirate; Respiratory  Result Value Ref Range Status   Specimen Description   Final    TRACHEAL ASPIRATE Performed at [facility], [address], [place], [place] [address]    Special Requests   Final    Normal Performed at [facility], [address] [place], [place] [address]  Gram Stain   Final    ABUNDANT WBC PRESENT, PREDOMINANTLY PMN FEW GRAM POSITIVE RODS RARE GRAM POSITIVE COCCI Performed at [facility], [place] [address], [place], [place] [address]    Culture PENDING  Incomplete   Report Status PENDING  Incomplete    Coagulation Studies: No results for input(s): LABPROT, INR in the last 72 hours.  Urinalysis: No results for input(s): COLORURINE, LABSPEC, PHURINE, GLUCOSEU, HGBUR, BILIRUBINUR, KETONESUR, PROTEINUR, UROBILINOGEN, NITRITE, LEUKOCYTESUR in the last 72 hours.  Invalid input(s): APPERANCEUR    Imaging: DG Chest Port 1 View  Result Date: 09/14/2019 CLINICAL DATA:  Attempted placement of dialysis catheter. EXAM: PORTABLE CHEST 1 VIEW COMPARISON:  One-view chest x-ray 09/13/2019 FINDINGS: The heart is enlarged. Patient remains intubated. NG tube terminates in the stomach. Right pleural effusion is noted within the right minor fissure and at the apex, likely redistributed due to positioning. Increased right upper lobe airspace disease is present. The right base is clear. The left lung is mostly clear. IMPRESSION: 1. Increased right upper lobe  airspace disease concerning for pneumonia. 2. Small right pleural effusion. 3. Cardiomegaly without failure. Electronically Signed   By: San Morelle M.D.   On: 09/14/2019 12:33   DG C-Arm 1-60 Min-No Report  Result Date: 09/14/2019 Fluoroscopy was utilized by the requesting physician.  No radiographic interpretation.     Medications:   . sodium chloride    . sodium chloride    . sodium chloride    . feeding supplement (VITAL AF 1.2 CAL) Stopped (09/14/19 2356)  . fentaNYL infusion INTRAVENOUS 125 mcg/hr (09/15/19 0600)  . meropenem (MERREM) IV Stopped (09/14/19 1858)   . acetaminophen  325 mg Per NG tube BID  . acetylcysteine  2 mL Nebulization QID  . albuterol  2.5 mg Nebulization QID  . aspirin  81 mg Per NG tube q morning - 10a  . atorvastatin  20 mg Per NG tube q1800  . calcitRIOL  0.25 mcg Per NG tube Q M,W,F  . chlorhexidine gluconate (MEDLINE KIT)  15 mL Mouth Rinse BID  . Chlorhexidine Gluconate Cloth  6 each Topical Daily  . Chlorhexidine Gluconate Cloth  6 each Topical Q0600  . cholecalciferol  4,000 Units Per NG tube q morning - 10a  . darbepoetin (ARANESP) injection - NON-DIALYSIS  150 mcg Subcutaneous Q Thu-1800  . feeding supplement (PRO-STAT SUGAR FREE 64)  30 mL Per Tube BID  . free water  60 mL Per Tube Q4H  . furosemide  80 mg Intravenous Q12H  . heparin injection (subcutaneous)  5,000 Units Subcutaneous Q8H  . influenza vaccine adjuvanted  0.5 mL Intramuscular Tomorrow-1000  . insulin aspart  0-6 Units Subcutaneous Q4H  . insulin glargine  16 Units Subcutaneous QHS  . levothyroxine  25 mcg Per NG tube Q0600  . mouth rinse  15 mL Mouth Rinse 10 times per day  . metoprolol tartrate  12.5 mg Per Tube BID  . montelukast  10 mg Per Tube QHS  . multivitamin  15 mL Per Tube Daily  . neomycin-bacitracin-polymyxin  1 application Topical Daily  . pantoprazole (PROTONIX) IV  40 mg Intravenous Daily  . primidone  50 mg Per NG tube q morning - 10a  .  pyridOXINE  200 mg Per NG tube BID  . risperiDONE  0.5 mg Per NG tube BID  . sodium chloride flush  3 mL Intravenous Q12H  . traZODone  50 mg Per NG tube Daily  . vancomycin variable dose  per unstable renal function (pharmacist dosing)   Does not apply See admin instructions   sodium chloride, sodium chloride, sodium chloride, acetaminophen **OR** acetaminophen, albuterol, alteplase, fentaNYL, fentaNYL (SUBLIMAZE) injection, heparin, heparin, hydrOXYzine, labetalol, LORazepam, metoprolol tartrate, midazolam, ondansetron **OR** ondansetron (ZOFRAN) IV, polyethylene glycol, sodium chloride flush  Assessment/ Plan:   Chronic renal insufficiency progressive secondary to diabetes mellitus.  I doubt this represents any episode of acute interstitial nephritis although she did have some white blood cells on the urinalysis.  She is negative dialysis dependent and has received dialysis 3 times.  First dialysis session was 09/11/2018 when her last dialysis session 09/14/2019.  She is being transferred to [facility] for vascular access placement.  Anemia patient receiving darbepoetin 150 mcg weekly.  Her last dose was 09/14/2019  Bones last PTH was 263 09/07/2019 she continues on calcitriol 3 times weekly.  Her phosphorus appears under excellent control and 2.9 09/14/2019  Hypertension/volume continues to be challenged as far as her dry weight is concerned she is tolerating ultrafiltration but did receive normal saline to avoid cramps 09/14/2019  Morbid obesity/obstructive sleep apnea intubated we will try to wean per critical care medicine.  Acute hypoxic respiratory failure acute decompensation 09/08/2019 required intubation the ICU CT angio was negative pulmonary embolus at that time.  Appreciate assistance of Dr. Christinia Gully  Diastolic heart failure last 2D echo 09/15/2018  Pneumonia continues on meropenem and vancomycin cultures negative so far.  No dialysis planned today but will discuss with critical  care medicine to see if they would like further volume removal.   Diabetes mellitus type 2 as per primary service   LOS: [place] '@TODAY''@8'$ :30 AM

## 2019-09-15 NOTE — Progress Notes (Signed)
PULMONARY / CRITICAL CARE MEDICINE   NAME:  [name], MRN:  [id number], DOB:  08-24-1950, LOS: 9 ADMISSION DATE:  09/06/2019, CONSULTATION DATE:  1/25 REFERRING MD:  [facility]  CHIEF COMPLAINT:  resp failure   BRIEF HISTORY:     20 yowf remote smoker carries dx of COPD/AB on pred ? How long plus just  saba admitted with progressive wt gain, leg swelling sob with w/u c/w anasarca / bilateral effusions  more sob 1/23 and required intubation / mech with CTa neg PE, positive for mild bilateral pleural effusions with small lung voumes and PCCM asked so see 1/25    HISTORY OF PRESENT ILLNESS   No direct hx availabe but from H/P:  69 y.o. female   HTN, DM (on trulicity), hyperlipidemia, diastolic heart failure, schizoaffective D/O with episodes of catatonia and sz d/o- on primidone/cymbalta and risperdone. She also seems to have CKD- crt in the low to mid 2's in early 2020- then a crt in the system from October 2020 that was 3, followed by [name] as her neurologist who does not believe the patient has frank seizures --Presented to the ED with worsening shortness of breath, worsening lower extremity edema and decreased urine output despite reported  compliance with diuretics at home -- In the ED chest x-rays consistent with CHF -Creatinine admit  up to 6.34 creatinine was 3.1 on 06/11/2019 and creatinine was 2.1 on 10/09/2018 SIGNIFICANT PAST MEDICAL HISTORY    . Anxiety   . Asthma   . Back pain   . Barrett esophagus   . CHF (congestive heart failure) ([place])   . CKD (chronic kidney disease) stage 3, GFR 30-59 ml/min   . COPD (chronic obstructive pulmonary disease) ([place])   . Depression   . Diastolic heart failure ([place])   . Essential hypertension   . Fibromyalgia   . GERD (gastroesophageal reflux disease)   . Gout   . History of pericarditis   . Hyperlipidemia   . Hypothyroid   . Major depressive disorder   . Memory loss   . Nephrolithiasis    2008  .  Obesity hypoventilation syndrome (H[facility])    CPAP  . Obstructive sleep apnea   . PE (pulmonary embolism)    2010  . Peripheral neuropathy   . PTSD (post-traumatic stress disorder)   . Rheumatoid arthritis ([place])   . Schizophrenia ([place])   . 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 Steroid dependence ([place])   . Type 2 diabetes mellitus ([place])   . Vitamin D deficiency    . APPENDECTOMY    . CHOLECYSTECTOMY    . COLONOSCOPY     benign polyps (12/2000), repeat colonoscopy performed in 2007  . ENDOMETRIAL BIOPSY     10/03 benign columnar mucosa (limited material)  . KIDNEY SURGERY       SIGNIFICANT EVENTS:  Respiratory  1/23 4pm resp distress on floor, rapid response > intubated and transferred to ICU Started HD 1/26  1/29 intubated for hypoxemic respiratory failure  STUDIES:   CTa  Chest 1/23 1. No CT evidence of acute pulmonary embolism. 2. Moderate severity left upper lobe and right lower lobe atelectasis and/or infiltrate with marked severity consolidation seen throughout the entire right lower lobe. 3. Moderate size bilateral pleural effusions, right greater than Left. CT Head 1/23 no acute cahnge CT head 1/26 (? L side neglect) > no acute changes   CULTURES:  Covid  1/21 neg MRSA pcr  1/23 neg  ET 1/29 >>>  ANTIBIOTICS:  Levaquin 1/24 >>> 1/29 Zosyn 1/29 >>> Vanc 1/29 only   LINES/TUBES:  Oral ET  1/23 L Fem HD 1/25  ETT 1/29>>>  CONSULTANTS:  Nephrology 1/21 PCCM 1/25   SUBJECTIVE/interim:  Transferred from [place] for for permanent dialysis catheter placement No events overnight   CONSTITUTIONAL: BP (!) 159/72   Pulse 79   Temp (!) 97.5 F (36.4 C) (Oral)   Resp 19   Ht 5\' 2"  (1.575 m)   Wt 95.7 kg   SpO2 100%   BMI 38.59 kg/m   I/O last 3 completed shifts: In: 592.8 [I.V.:87; IV Piggyback:505.8] Out: [id number]; Other:3000; Blood:5]     Vent Mode: PRVC FiO2 (%):  [30 %-40 %] 40 % Set Rate:  [20 bmp] 20 bmp Vt  Set:  [400 mL] 400 mL PEEP:  [5 cmH20] 5 cmH20 Plateau Pressure:  [14 cmH20-22 cmH20] 17 cmH20  PHYSICAL EXAM: General:  Chronically ill appearing female, NAD, sedate Neuro:  Arousable and responsive to command in all 4 ext HEENT: [place]/AT, PERRL, EOM-I and MMM, ETT in place Cardiovascular: RRR, Nl S1/S2 and -M/R/G Lungs: Diminished R>L Abdomen: Soft, NT, ND and +BS Skin: no rash/ skin breakdown  Ext: -edema and -tenderness  I reviewed CXR myself, ETT is high on the 1/29 and RLL infiltrate with RML atelectasis   Discussed with bedside RN and RT  RESOLVED PROBLEM LIST   ASSESSMENT AND PLAN    1) Acute resp failure hypoxemic and hypercarbic present on admit  in setting of vol overload, acute renal failure and anasarca/pleural effusions made worse by low albumin  - Extubate since no vascular surgery until tomorrow to place tunneled catheter - Titrate O2 for sat of 88-92% - Abx for pneumonia - F/u on cultures - On chronic steroids for RA, start stress dose steroids  2) Acute renal failure/ low albumin > bilateral pleural effusions. - HD per renal - Encourage volume negative - Dr. Vella Redhead from vascular contacted, likely to the OR in AM  3) Acute metabolic encephalopathy secondary to hypercarbia, present on admit. - Fentanyl drip for comfort - Minimize sedation for weaning efforts  4) Normocytic anemia typical of renal failure > rx per [facility] / renal    Lab Results  Component Value Date   HGB 8.2 (L) 09/13/2019   HGB 7.4 (L) 09/10/2019   HGB 7.0 (L) 09/09/2019  trending better, continue to monitor    5) Thrombocytopnia  on Hep Lab Results  Component Value Date   PLT 174 09/13/2019   PLT 126 (L) 09/10/2019   PLT 155 09/09/2019   trending better, continue to monitor   6) New densities RUL ? Atx vs  HCAP ? Asp - Continue zosyn - Vanc dosing with dialysis - May need bronchoscopy if repeat CXR today continues to reflect atelectasis  SUMMARY OF TODAY'S PLAN:  Extubate  today To OR on Monday for vascular access Post extubation by 2 hours developed stridor and additional steroids and racemic epi ordered  Best Practice / Goals of Care / Disposition.   DVT PROPHYLAXIS:  Sub Q hep SUP: PPI NUTRITION:per [facility]  MOBILITY:as tol  GOALS OF CARE: per [facility]  FAMILY DISCUSSIONS: per [facility]  DISPOSITION remain ICU/ on Vent and transfer to [facility] when bed available   LABS  Glucose Recent Labs  Lab 09/14/19 1350 09/14/19 1627 09/14/19 1940 09/14/19 2120 09/14/19 2312 09/15/19 0319  GLUCAP 169* 147* 129* 133* 137* 112*    BMET Recent Labs  Lab 09/12/19 0403  09/13/19 0341 09/14/19 0507  NA 138 138 137  K 3.3* 3.8 3.2*  CL 102 101 97*  CO2 24 27 27   BUN 58* 45* 32*  CREATININE 6.36* 4.61* 3.18*  GLUCOSE 131* 169* 165*    Liver Enzymes Recent Labs  Lab 09/12/19 0403 09/13/19 0341 09/14/19 0507  ALBUMIN 1.8* 2.0* 2.1*    Electrolytes Recent Labs  Lab 09/10/19 0406 09/11/19 0412 09/12/19 0403 09/13/19 0341 09/14/19 0507  CALCIUM 7.7*   < > 7.9* 8.1* 8.3*  MG 2.3  --   --   --   --   PHOS 6.5*   < > 6.5* 4.4 2.9   < > = values in this interval not displayed.    CBC Recent Labs  Lab 09/09/19 0424 09/10/19 0406 09/13/19 0341  WBC 9.0 10.2 10.6*  HGB 7.0* 7.4* 8.2*  HCT 20.9* 22.6* 25.1*  PLT 155 126* 174    ABG Recent Labs  Lab 09/08/19 1755 09/09/19 0424 09/10/19 0409  PHART 7.363 7.420 7.412  PCO2ART 46.7 40.8 41.0  PO2ART 77.8* 65.6* 66.0*    Coag's Recent Labs  Lab 09/09/19 1224  INR 1.1    Sepsis Markers No results for input(s): LATICACIDVEN, PROCALCITON, O2SATVEN in the last 168 hours.  Cardiac Enzymes No results for input(s): TROPONINI, PROBNP in the last 168 hours.   The patient is critically ill with multiple organ systems failure and requires high complexity decision making for assessment and support, frequent evaluation and titration of therapies, application of advanced monitoring technologies and  extensive interpretation of multiple databases.   Critical Care Time devoted to patient care services described in this note is  31  Minutes. This time reflects time of care of this signee Dr [name]. This critical care time does not reflect procedure time, or teaching time or supervisory time of PA/NP/Med student/Med Resident etc but could involve care discussion time.  Rush Farmer, M.D. [facility] Pulmonary/Critical Care Medicine.

## 2019-09-15 NOTE — Progress Notes (Signed)
RT note: patient meets SBT criteria this AM however patient is planned to go to IR today.  Will hold until after returns from IR.  Tolerating current settings well.  Will continue to monitor.

## 2019-09-15 NOTE — Progress Notes (Addendum)
[place] Progress Note Patient Name: [name] DOB: 08-16-1951 MRN: [id number]   Date of Service  09/15/2019  HPI/Events of Note  Notified that patient felt short of breath.  Spoke with RT who appreciated some stridor.    Pt was extubated earlier today and was started on IV steroids, diuresed and given a dose of racemic epinephrine.   On video assessment, pt is awake and alert.  She is able to speak but did say she is having trouble breathing.  eICU Interventions  Give albuterol neb.  Give racemic epinephrine.  Continue with IV steroids, dialysis, Lasix. Close monitoring for possible reintubation.     Intervention Category Intermediate Interventions: Respiratory distress - evaluation and management  [name] 09/15/2019, 8:05 PM   11:24 PM Pt still with stridor but is able to protect her airway.   Plan> Give racemic epinephrine q4hrs. Ok to use CPAP QHS.  Add famotidine.  Continue with IV steroids, diuretics.  Recheck ABG in the AM.

## 2019-09-16 ENCOUNTER — Inpatient Hospital Stay (HOSPITAL_COMMUNITY): Payer: Medicare Other

## 2019-09-16 ENCOUNTER — Encounter (HOSPITAL_COMMUNITY): Payer: Medicare Other

## 2019-09-16 DIAGNOSIS — E8779 Other fluid overload: Secondary | ICD-10-CM | POA: Diagnosis not present

## 2019-09-16 DIAGNOSIS — N186 End stage renal disease: Secondary | ICD-10-CM | POA: Diagnosis not present

## 2019-09-16 DIAGNOSIS — R0602 Shortness of breath: Secondary | ICD-10-CM | POA: Diagnosis not present

## 2019-09-16 DIAGNOSIS — J449 Chronic obstructive pulmonary disease, unspecified: Secondary | ICD-10-CM

## 2019-09-16 DIAGNOSIS — N179 Acute kidney failure, unspecified: Secondary | ICD-10-CM | POA: Diagnosis not present

## 2019-09-16 DIAGNOSIS — Z20822 Contact with and (suspected) exposure to covid-19: Secondary | ICD-10-CM | POA: Diagnosis not present

## 2019-09-16 DIAGNOSIS — J810 Acute pulmonary edema: Secondary | ICD-10-CM | POA: Diagnosis not present

## 2019-09-16 DIAGNOSIS — J9601 Acute respiratory failure with hypoxia: Secondary | ICD-10-CM | POA: Diagnosis not present

## 2019-09-16 DIAGNOSIS — G9341 Metabolic encephalopathy: Secondary | ICD-10-CM | POA: Diagnosis not present

## 2019-09-16 DIAGNOSIS — J9602 Acute respiratory failure with hypercapnia: Secondary | ICD-10-CM | POA: Diagnosis not present

## 2019-09-16 DIAGNOSIS — Z992 Dependence on renal dialysis: Secondary | ICD-10-CM | POA: Diagnosis not present

## 2019-09-16 LAB — RENAL FUNCTION PANEL
Albumin: 1.8 g/dL — ABNORMAL LOW (ref 3.5–5.0)
Anion gap: 16 — ABNORMAL HIGH (ref 5–15)
BUN: 55 mg/dL — ABNORMAL HIGH (ref 8–23)
CO2: 23 mmol/L (ref 22–32)
Calcium: 9 mg/dL (ref 8.9–10.3)
Chloride: 102 mmol/L (ref 98–111)
Creatinine, Ser: 4.9 mg/dL — ABNORMAL HIGH (ref 0.44–1.00)
GFR calc Af Amer: 10 mL/min — ABNORMAL LOW (ref >60)
GFR calc non Af Amer: 9 mL/min — ABNORMAL LOW (ref >60)
Glucose, Bld: 219 mg/dL — ABNORMAL HIGH (ref 70–99)
Phosphorus: 6.4 mg/dL — ABNORMAL HIGH (ref 2.5–4.6)
Potassium: 3.7 mmol/L (ref 3.5–5.1)
Sodium: 141 mmol/L (ref 135–145)

## 2019-09-16 LAB — CBC
HCT: 26.8 % — ABNORMAL LOW (ref 36.0–46.0)
Hemoglobin: 8.7 g/dL — ABNORMAL LOW (ref 12.0–15.0)
MCH: 32.2 pg (ref 26.0–34.0)
MCHC: 32.5 g/dL (ref 30.0–36.0)
MCV: 99.3 fL (ref 80.0–100.0)
Platelets: 220 10*3/uL (ref 150–400)
RBC: 2.7 MIL/uL — ABNORMAL LOW (ref 3.87–5.11)
RDW: 13.7 % (ref 11.5–15.5)
WBC: 10.3 10*3/uL (ref 4.0–10.5)
nRBC: 0 % (ref 0.0–0.2)

## 2019-09-16 LAB — POCT I-STAT 7, (LYTES, BLD GAS, ICA,H+H)
Bicarbonate: 25.5 mmol/L (ref 20.0–28.0)
Calcium, Ion: 1.23 mmol/L (ref 1.15–1.40)
HCT: 31 % — ABNORMAL LOW (ref 36.0–46.0)
Hemoglobin: 10.5 g/dL — ABNORMAL LOW (ref 12.0–15.0)
O2 Saturation: 95 %
Patient temperature: 97.7
Potassium: 3.6 mmol/L (ref 3.5–5.1)
Sodium: 138 mmol/L (ref 135–145)
TCO2: 27 mmol/L (ref 22–32)
pCO2 arterial: 41.5 mmHg (ref 32.0–48.0)
pH, Arterial: 7.393 (ref 7.350–7.450)
pO2, Arterial: 75 mmHg — ABNORMAL LOW (ref 83.0–108.0)

## 2019-09-16 LAB — GLUCOSE, CAPILLARY
Glucose-Capillary: 214 mg/dL — ABNORMAL HIGH (ref 70–99)
Glucose-Capillary: 198 mg/dL — ABNORMAL HIGH (ref 70–99)
Glucose-Capillary: 191 mg/dL — ABNORMAL HIGH (ref 70–99)
Glucose-Capillary: 199 mg/dL — ABNORMAL HIGH (ref 70–99)
Glucose-Capillary: 199 mg/dL — ABNORMAL HIGH (ref 70–99)
Glucose-Capillary: 248 mg/dL — ABNORMAL HIGH (ref 70–99)

## 2019-09-16 LAB — CULTURE, RESPIRATORY W GRAM STAIN
Culture: NORMAL
Special Requests: NORMAL

## 2019-09-16 LAB — MAGNESIUM: Magnesium: 2.2 mg/dL (ref 1.7–2.4)

## 2019-09-16 MED ORDER — HYDRALAZINE HCL 20 MG/ML IJ SOLN
20.0000 mg | Freq: Once | INTRAMUSCULAR | Status: AC
  Administered 2019-09-16: 20 mg via INTRAVENOUS

## 2019-09-16 MED ORDER — FENTANYL CITRATE (PF) 100 MCG/2ML IJ SOLN: 100.0000 ug | Freq: Once | INTRAMUSCULAR | Status: AC

## 2019-09-16 MED ORDER — FENTANYL CITRATE (PF) 100 MCG/2ML IJ SOLN
INTRAMUSCULAR | Status: AC
  Administered 2019-09-16: 100 ug via INTRAVENOUS
  Filled 2019-09-16: qty 2

## 2019-09-16 MED ORDER — CLEVIDIPINE BUTYRATE 0.5 MG/ML IV EMUL
0.0000 mg/h | INTRAVENOUS | Status: AC
  Filled 2019-09-16: qty 50

## 2019-09-16 MED ORDER — SODIUM CHLORIDE 0.9 % IV SOLN: INTRAVENOUS | Status: DC

## 2019-09-16 MED ORDER — ETOMIDATE 2 MG/ML IV SOLN
20.0000 mg | Freq: Once | INTRAVENOUS | Status: AC
  Administered 2019-09-16: 20 mg via INTRAVENOUS

## 2019-09-16 MED ORDER — HYDRALAZINE HCL 20 MG/ML IJ SOLN
INTRAMUSCULAR | Status: AC
  Filled 2019-09-16: qty 1

## 2019-09-16 MED ORDER — FENTANYL BOLUS VIA INFUSION
25.0000 ug | INTRAVENOUS | Status: DC | PRN
  Administered 2019-09-17 (×4): 25 ug via INTRAVENOUS
  Filled 2019-09-16: qty 25

## 2019-09-16 MED ORDER — DOCUSATE SODIUM 100 MG PO CAPS
100.0000 mg | ORAL_CAPSULE | Freq: Two times a day (BID) | ORAL | Status: DC
  Filled 2019-09-16: qty 1

## 2019-09-16 MED ORDER — ROCURONIUM BROMIDE 50 MG/5ML IV SOLN
75.0000 mg | Freq: Once | INTRAVENOUS | Status: AC
  Administered 2019-09-16: 75 mg via INTRAVENOUS
  Filled 2019-09-16: qty 7.5

## 2019-09-16 MED ORDER — DEXMEDETOMIDINE HCL IN NACL 400 MCG/100ML IV SOLN
0.0000 ug/kg/h | INTRAVENOUS | Status: AC
  Administered 2019-09-17: 0.6 ug/kg/h via INTRAVENOUS
  Administered 2019-09-17: 0.5 ug/kg/h via INTRAVENOUS
  Administered 2019-09-18 (×2): 0.4 ug/kg/h via INTRAVENOUS
  Administered 2019-09-19: 0.6 ug/kg/h via INTRAVENOUS
  Filled 2019-09-16 (×7): qty 100

## 2019-09-16 MED ORDER — DOCUSATE SODIUM 50 MG/5ML PO LIQD
100.0000 mg | Freq: Two times a day (BID) | ORAL | Status: DC
  Administered 2019-09-17 – 2019-09-20 (×7): 100 mg
  Filled 2019-09-16 (×7): qty 10

## 2019-09-16 MED ORDER — MIDAZOLAM HCL 2 MG/2ML IJ SOLN: 2.0000 mg | Freq: Once | INTRAMUSCULAR | Status: AC

## 2019-09-16 MED ORDER — CHLORHEXIDINE GLUCONATE CLOTH 2 % EX PADS
6.0000 | MEDICATED_PAD | Freq: Every day | CUTANEOUS | Status: DC
  Administered 2019-09-16: 6 via TOPICAL

## 2019-09-16 MED ORDER — MIDAZOLAM HCL 2 MG/2ML IJ SOLN
INTRAMUSCULAR | Status: AC
  Administered 2019-09-16: 2 mg via INTRAVENOUS
  Filled 2019-09-16: qty 2

## 2019-09-16 MED ORDER — LABETALOL HCL 5 MG/ML IV SOLN: 20.0000 mg | Freq: Once | INTRAVENOUS | Status: AC

## 2019-09-16 MED ORDER — FENTANYL CITRATE (PF) 100 MCG/2ML IJ SOLN
INTRAMUSCULAR | Status: AC
  Filled 2019-09-16: qty 2

## 2019-09-16 MED ORDER — FENTANYL CITRATE (PF) 100 MCG/2ML IJ SOLN
25.0000 ug | Freq: Once | INTRAMUSCULAR | Status: DC
  Filled 2019-09-16 (×2): qty 2

## 2019-09-16 MED ORDER — LABETALOL HCL 5 MG/ML IV SOLN
INTRAVENOUS | Status: AC
  Administered 2019-09-16: 20 mg via INTRAVENOUS
  Filled 2019-09-16: qty 4

## 2019-09-16 MED ORDER — FENTANYL 2500MCG IN NS 250ML (10MCG/ML) PREMIX INFUSION
0.0000 ug/h | INTRAVENOUS | Status: DC
  Administered 2019-09-16: 25 ug/h via INTRAVENOUS
  Administered 2019-09-17: 150 ug/h via INTRAVENOUS
  Filled 2019-09-16 (×2): qty 250

## 2019-09-16 MED ORDER — RACEPINEPHRINE HCL 2.25 % IN NEBU
0.5000 mL | INHALATION_SOLUTION | RESPIRATORY_TRACT | Status: DC | PRN
  Filled 2019-09-16: qty 0.5

## 2019-09-16 MED ORDER — FERRIC CITRATE 1 GM 210 MG(FE) PO TABS
420.0000 mg | ORAL_TABLET | Freq: Three times a day (TID) | ORAL | Status: DC
  Administered 2019-09-16 – 2019-09-20 (×12): 420 mg via ORAL
  Filled 2019-09-16 (×13): qty 2

## 2019-09-16 MED ORDER — PANTOPRAZOLE SODIUM 40 MG PO PACK
40.0000 mg | PACK | Freq: Every day | ORAL | Status: DC
  Administered 2019-09-17 – 2019-09-20 (×4): 40 mg
  Filled 2019-09-16 (×6): qty 20

## 2019-09-16 NOTE — Procedures (Signed)
Intubation Procedure Note [name] [id number] [birth year]  Procedure: Intubation Indications: Respiratory insufficiency  Procedure Details Consent: Unable to obtain consent because of emergent medical necessity. Time Out: Verified patient identification, verified procedure, site/side was marked, verified correct patient position, special equipment/implants available, medications/allergies/relevent history reviewed, required imaging and test results available.  Performed  Maximum sterile technique was used including cap, gloves, hand hygiene and mask.  MAC    Evaluation Hemodynamic Status: BP stable throughout; O2 sats: stable throughout Patient's Current Condition: stable Complications: No apparent complications Patient did tolerate procedure well. Chest X-ray ordered to verify placement.  CXR: pending.   [name] 09/16/2019

## 2019-09-16 NOTE — Progress Notes (Signed)
Called by bedside RT.  Patient is having increasing SOB and now increased WOB.  Upon evaluating patient, worsening stridor.  She is clearly in respiratory failure on exam with severe stridor.  She is full code.  She has no POA.  The number on the system is for the administrator of the facility where she lives.  Patient was intubated without difficulty.  Vent setting set.  ABG and CXR ordered and pending.  Will adjust vent for ABG.  Will cancel transfer order.  The patient is critically ill with multiple organ systems failure and requires high complexity decision making for assessment and support, frequent evaluation and titration of therapies, application of advanced monitoring technologies and extensive interpretation of multiple databases.   Critical Care Time devoted to patient care services described in this note is  40  Minutes. This time reflects time of care of this signee Dr [name]. This critical care time does not reflect procedure time, or teaching time or supervisory time of PA/NP/Med student/Med Resident etc but could involve care discussion time.  Rush Farmer, M.D. [facility] Pulmonary/Critical Care Medicine.

## 2019-09-16 NOTE — Progress Notes (Signed)
PT Cancellation Note  Patient Details Name: [name] MRN: [id number] DOB: [birth year]   Cancelled Treatment:    Reason Eval/Treat Not Completed: Medical issues which prohibited therapy. Pt with L femoral non tunneled HD catheter. Tentative plan for permanent IJ catheter placement tomorrow, 2/1. PT to follow and proceed with eval when medically ready.   Lorriane Shire 09/16/2019, 10:06 AM   Lorrin Goodell, PT  Office # [phone] Pager [phone]

## 2019-09-16 NOTE — Procedures (Signed)
OGT Placement By MD  OGT placed under direct laryngoscopy and confirmed by auscultation and x-ray  Rush Farmer, M.D. [facility] Pulmonary/Critical Care Medicine.

## 2019-09-16 NOTE — Progress Notes (Signed)
[facility] ROUNDING NOTE   Subjective:   This is a 69 year old lady with history of hypertension diabetes hyperlipidemia diastolic heart failure schizophrenia disorder with chronic kidney disease and the creatinine in the low to mid twos early 2020.  Her last creatinine in the system was 3 mg/dL October 2020.  She was admitted 09/06/2019 with progressive weight gain shortness of breath and lower extremity edema.  Serum creatinine 09/06/2019 was 6.34 mg/dL.  Urinalysis showed greater than 300 mg of protein with 0-5 RBCs and 21-50 WBCs per high-powered film.  Renal ultrasound was unremarkable with 8.2 cm kidneys on the right 9.4 cm kidney on the left with no evidence of mass or hydronephrosis.  She is received dialysis 09/12/2019 1 L removed, 09/13/2019 with 2 L removed on 09/14/2019 with 3 L removed.  Her weight is improved from 99.9 kg - 95.7 kg.  It appears that she has been followed without bradycardia in the past.  She is undergone dialysis through a left femoral dialysis catheter placed 09/10/2019 and is being transferred to [facility] for interventional radiology to place a tunneled dialysis catheter.  She also get consultation with vein and vascular surgery for placement of AV fistula/AV graft.  Blood pressure 136/80 pulse 87 temperature 98.2 O2 sats 97% 4 L nasal cannula  Sodium 138 potassium 3.6 chloride 102 CO2 23 BUN 35 creatinine 4.90 glucose 219 calcium 9.0 phosphorus 6.4 albumin 1.8 hemoglobin 10.5   Aspirin 81 mg daily Lipitor 20 mg daily calcitriol 0.25 mcg Monday Wednesday Friday, vitamin D3 4000 units daily darbepoetin 150 mcg administered 09/06/2019 and 09/14/2019, levothyroxine 25 mcg daily, metoprolol 12.5 mg twice daily multivitamins 1 daily Singulair 10 mg daily Protonix 40 mg daily IV, primidone 50 mg daily pyridoxine 200 mg twice daily Risperdal 0.5 mg twice daily trazodone 50 mg daily  IV meropenem 500 mg every 24 hours IV vancomycin 2 g administered  09/14/2019   Objective:  Vital signs in last 24 hours:  Temp:  [97.9 F (36.6 C)-98.2 F (36.8 C)] 98.2 F (36.8 C) (01/31 0700) Pulse Rate:  [80-95] 89 (01/31 0800) Resp:  [18-34] 29 (01/31 0800) BP: (133-183)/(56-91) 162/76 (01/31 0800) SpO2:  [85 %-100 %] 100 % (01/31 0800)  Weight change:  Filed Weights   09/13/19 2220 09/14/19 0500 09/15/19 0500  Weight: 96.9 kg 93 kg 95.7 kg    Intake/Output: I/O last 3 completed shifts: In: 378.9 [I.V.:141.1; NG/GT:130; IV Piggyback:107.8] Out: 1610 [Urine:1610]   Intake/Output this shift:  No intake/output data recorded.  CVS- RRR no murmurs rubs or gallops RS- CTA no wheezes or rales ABD- BS present soft non-distended EXT-trace pitting edema   Basic Metabolic Panel: Recent Labs  Lab 09/10/19 0406 09/10/19 0406 09/11/19 4196 09/11/19 2229 09/12/19 0403 09/12/19 0403 09/13/19 0341 09/14/19 0507 09/16/19 0243 09/16/19 0356  NA 139   < > 141   < > 138  --  138 137 141 138  K 3.7   < > 3.6   < > 3.3*  --  3.8 3.2* 3.7 3.6  CL 102   < > 105  --  102  --  101 97* 102  --   CO2 23   < > 23  --  24  --  _0 --   GLUCOSE 129*   < > 73  --  131*  --  169* 165* 219*  --   BUN 69*   < > 73*  --  58*  --  45* 32* 55*  --   CREATININE 6.93*   < > 7.50*  --  6.36*  --  4.61* 3.18* 4.90*  --   CALCIUM 7.7*   < > 7.7*   < > 7.9*   < > 8.1* 8.3* 9.0  --   MG 2.3  --   --   --   --   --   --   --  2.2  --   PHOS 6.5*   < > 7.5*  --  6.5*  --  4.4 2.9 6.4*  --    < > = values in this interval not displayed.    Liver Function Tests: Recent Labs  Lab 09/11/19 0412 09/12/19 0403 09/13/19 0341 09/14/19 0507 09/16/19 0243  ALBUMIN 2.0* 1.8* 2.0* 2.1* 1.8*   No results for input(s): LIPASE, AMYLASE in the last 168 hours. No results for input(s): AMMONIA in the last 168 hours.  CBC: Recent Labs  Lab 09/10/19 0406 09/13/19 0341 09/16/19 0243 09/16/19 0356  WBC 10.2 10.6* 10.3  --   HGB 7.4* 8.2* 8.7* 10.5*  HCT 22.6*  25.1* 26.8* 31.0*  MCV 98.7 101.6* 99.3  --   PLT 126* 174 220  --     Cardiac Enzymes: No results for input(s): CKTOTAL, CKMB, CKMBINDEX, TROPONINI in the last 168 hours.  BNP: Invalid input(s): POCBNP  CBG: Recent Labs  Lab 09/15/19 1513 09/15/19 1931 09/15/19 2329 09/16/19 0349 09/16/19 0716  GLUCAP 180* 171* 214* 198* 191*    Microbiology: Results for orders placed or performed during the hospital encounter of 09/06/19  SARS CORONAVIRUS 2 (TAT 6-24 HRS) Nasopharyngeal Nasopharyngeal Swab     Status: None   Collection Time: 09/06/19  3:43 AM   Specimen: Nasopharyngeal Swab  Result Value Ref Range Status   SARS Coronavirus 2 NEGATIVE NEGATIVE Final    Comment: (NOTE) SARS-CoV-2 target nucleic acids are NOT DETECTED. The SARS-CoV-2 RNA is generally detectable in upper and lower respiratory specimens during the acute phase of infection. Negative results do not preclude SARS-CoV-2 infection, do not rule out co-infections with other pathogens, and should not be used as the sole basis for treatment or other patient management decisions. Negative results must be combined with clinical observations, patient history, and epidemiological information. The expected result is Negative. Fact Sheet for Patients: SugarRoll.be Fact Sheet for Healthcare Providers: https://www.woods-mathews.com/ This test is not yet approved or cleared by the [place] FDA and  has been authorized for detection and/or diagnosis of SARS-CoV-2 by FDA under an Emergency Use Authorization (EUA). This EUA will remain  in effect (meaning this test can be used) for the duration of the COVID-19 declaration under Section 56 4(b)(1) of the Act, 21 U.S.C. section 360bbb-3(b)(1), unless the authorization is terminated or revoked sooner. Performed at [facility], [place] [address], [place], [place] [address]   MRSA PCR Screening     Status: None   Collection  Time: 09/08/19 12:30 PM   Specimen: Nasal Mucosa; Nasopharyngeal  Result Value Ref Range Status   MRSA by PCR NEGATIVE NEGATIVE Final    Comment:        The GeneXpert MRSA Assay (FDA approved for NASAL specimens only), is one component of a comprehensive MRSA colonization surveillance program. It is not intended to diagnose MRSA infection nor to guide or monitor treatment for MRSA infections. Performed at [facility], [address], [place],  [address]   Culture, respiratory (non-expectorated)     Status: None (Preliminary  result)   Collection Time: 09/14/19  1:00 PM   Specimen: Tracheal Aspirate; Respiratory  Result Value Ref Range Status   Specimen Description   Final    TRACHEAL ASPIRATE Performed at [facility], [address], [place], [place] [address]    Special Requests   Final    Normal Performed at [facility], [address] [place], [place] [address]    Gram Stain   Final    ABUNDANT WBC PRESENT, PREDOMINANTLY PMN FEW GRAM POSITIVE RODS RARE GRAM POSITIVE COCCI    Culture   Final    NO GROWTH <12 HOURS Performed at [facility], [place] [address] [place], [place] [address]    Report Status PENDING  Incomplete    Coagulation Studies: No results for input(s): LABPROT, INR in the last 72 hours.  Urinalysis: No results for input(s): COLORURINE, LABSPEC, PHURINE, GLUCOSEU, HGBUR, BILIRUBINUR, KETONESUR, PROTEINUR, UROBILINOGEN, NITRITE, LEUKOCYTESUR in the last 72 hours.  Invalid input(s): APPERANCEUR    Imaging: DG Chest Port 1 View  Result Date: 09/14/2019 CLINICAL DATA:  Attempted placement of dialysis catheter. EXAM: PORTABLE CHEST 1 VIEW COMPARISON:  One-view chest x-ray 09/13/2019 FINDINGS: The heart is enlarged. Patient remains intubated. NG tube terminates in the stomach. Right pleural effusion is noted within the right minor fissure and at the apex, likely redistributed due to positioning. Increased right upper lobe airspace disease is  present. The right base is clear. The left lung is mostly clear. IMPRESSION: 1. Increased right upper lobe airspace disease concerning for pneumonia. 2. Small right pleural effusion. 3. Cardiomegaly without failure. Electronically Signed   By: San Morelle M.D.   On: 09/14/2019 12:33   DG C-Arm 1-60 Min-No Report  Result Date: 09/14/2019 Fluoroscopy was utilized by the requesting physician.  No radiographic interpretation.     Medications:   . sodium chloride    . sodium chloride    . sodium chloride    . sodium chloride 10 mL/hr at 09/15/19 1800  . famotidine (PEPCID) IV 20 mg (09/16/19 0228)  . feeding supplement (VITAL AF 1.2 CAL) Stopped (09/14/19 2356)  . meropenem (MERREM) IV 500 mg (09/15/19 1826)   . acetaminophen  325 mg Per NG tube BID  . albuterol  2.5 mg Nebulization QID  . aspirin  81 mg Per NG tube q morning - 10a  . atorvastatin  20 mg Per NG tube q1800  . calcitRIOL  0.25 mcg Per NG tube Q M,W,F  . chlorhexidine gluconate (MEDLINE KIT)  15 mL Mouth Rinse BID  . Chlorhexidine Gluconate Cloth  6 each Topical Daily  . Chlorhexidine Gluconate Cloth  6 each Topical Q0600  . cholecalciferol  4,000 Units Per NG tube q morning - 10a  . darbepoetin (ARANESP) injection - NON-DIALYSIS  150 mcg Subcutaneous Q Thu-1800  . feeding supplement (PRO-STAT SUGAR FREE 64)  30 mL Per Tube BID  . free water  60 mL Per Tube Q4H  . furosemide  80 mg Intravenous Q12H  . heparin injection (subcutaneous)  5,000 Units Subcutaneous Q8H  . hydrocortisone sodium succinate  50 mg Intravenous Q6H  . influenza vaccine adjuvanted  0.5 mL Intramuscular Tomorrow-1000  . insulin aspart  0-6 Units Subcutaneous Q4H  . insulin glargine  16 Units Subcutaneous QHS  . levothyroxine  25 mcg Per NG tube Q0600  . LORazepam  1 mg Intravenous Q4H  . mouth rinse  15 mL Mouth Rinse 10 times per day  . methylPREDNISolone (SOLU-MEDROL) injection  60  mg Intravenous Q6H  . metoprolol tartrate  12.5 mg Per  Tube BID  . montelukast  10 mg Per Tube QHS  . multivitamin  15 mL Per Tube Daily  . neomycin-bacitracin-polymyxin  1 application Topical Daily  . pantoprazole (PROTONIX) IV  40 mg Intravenous Daily  . primidone  50 mg Per NG tube q morning - 10a  . pyridOXINE  200 mg Per NG tube BID  . Racepinephrine HCl  0.5 mL Nebulization Q4H  . risperiDONE  0.5 mg Per NG tube BID  . sodium chloride flush  3 mL Intravenous Q12H  . traZODone  50 mg Per NG tube Daily  . vancomycin variable dose per unstable renal function (pharmacist dosing)   Does not apply See admin instructions   sodium chloride, sodium chloride, sodium chloride, sodium chloride, acetaminophen **OR** acetaminophen, albuterol, alteplase, heparin, heparin, hydrOXYzine, labetalol, metoprolol tartrate, midazolam, ondansetron **OR** ondansetron (ZOFRAN) IV, polyethylene glycol, sodium chloride flush  Assessment/ Plan:   Chronic renal insufficiency progressive secondary to diabetes mellitus.  I doubt this represents any episode of acute interstitial nephritis although she did have some white blood cells on the urinalysis.  She is negative dialysis dependent and has received dialysis 3 times.  First dialysis session was 09/11/2018 when her last dialysis session 09/14/2019.  She is being transferred to [facility] for vascular access placement.  Discussed case with Dr. Siri Cole who will see the patient for access placement next week.  Next dialysis treatment will be 09/17/2019  Anemia patient receiving darbepoetin 150 mcg weekly.  Her last dose was 09/14/2019  Bones last PTH was 263 09/07/2019 she continues on calcitriol 3 times weekly.  Her phosphorus appears under excellent control and 2.9 09/14/2019  Hypertension/volume continues to be challenged as far as her dry weight is concerned she is tolerating ultrafiltration but did receive normal saline to avoid cramps 09/14/2019  Morbid obesity/obstructive sleep apnea intubated we will try to wean per  critical care medicine.  Acute hypoxic respiratory failure acute decompensation 09/08/2019 required intubation the ICU CT angio was negative pulmonary embolus at that time.  Appreciate assistance of Dr. Christinia Gully  Diastolic heart failure last 2D echo 09/15/2018  Pneumonia continues on meropenem and vancomycin cultures negative so far.   Diabetes mellitus per primary service   LOS: [place] _0 _1 :25 AM

## 2019-09-16 NOTE — Progress Notes (Signed)
Patient requesting to go back on her CPAP.  Placed patient on CPAP with 4L bled in and is currently tolerating well.  Will continue to monitor.

## 2019-09-16 NOTE — Progress Notes (Signed)
PULMONARY / CRITICAL CARE MEDICINE   NAME:  [name], MRN:  [id number], DOB:  [birth year], LOS: 51 ADMISSION DATE:  09/06/2019, CONSULTATION DATE:  1/25 REFERRING MD:  [facility]  CHIEF COMPLAINT:  resp failure   BRIEF HISTORY:     55 yowf remote smoker carries dx of COPD/AB on pred ? How long plus just  saba admitted with progressive wt gain, leg swelling sob with w/u c/w anasarca / bilateral effusions  more sob 1/23 and required intubation / mech with CTa neg PE, positive for mild bilateral pleural effusions with small lung voumes and PCCM asked so see 1/25    HISTORY OF PRESENT ILLNESS   No direct hx availabe but from H/P:  69 y.o. female   HTN, DM (on trulicity), hyperlipidemia, diastolic heart failure, schizoaffective D/O with episodes of catatonia and sz d/o- on primidone/cymbalta and risperdone. She also seems to have CKD- crt in the low to mid 2's in early 2020- then a crt in the system from October 2020 that was 3, followed by [name] as her neurologist who does not believe the patient has frank seizures --Presented to the ED with worsening shortness of breath, worsening lower extremity edema and decreased urine output despite reported  compliance with diuretics at home -- In the ED chest x-rays consistent with CHF -Creatinine admit  up to 6.34 creatinine was 3.1 on 06/11/2019 and creatinine was 2.1 on 10/09/2018 SIGNIFICANT PAST MEDICAL HISTORY    . Anxiety   . Asthma   . Back pain   . Barrett esophagus   . CHF (congestive heart failure) ([place])   . CKD (chronic kidney disease) stage 3, GFR 30-59 ml/min   . COPD (chronic obstructive pulmonary disease) ([place])   . Depression   . Diastolic heart failure ([place])   . Essential hypertension   . Fibromyalgia   . GERD (gastroesophageal reflux disease)   . Gout   . History of pericarditis   . Hyperlipidemia   . Hypothyroid   . Major depressive disorder   . Memory loss   . Nephrolithiasis    2008  .  Obesity hypoventilation syndrome (HCC)    CPAP  . Obstructive sleep apnea   . PE (pulmonary embolism)    2010  . Peripheral neuropathy   . PTSD (post-traumatic stress disorder)   . Rheumatoid arthritis ([place])   . Schizophrenia ([place])   . 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 Steroid dependence ([place])   . Type 2 diabetes mellitus ([place])   . Vitamin D deficiency    . APPENDECTOMY    . CHOLECYSTECTOMY    . COLONOSCOPY     benign polyps (12/2000), repeat colonoscopy performed in 2007  . ENDOMETRIAL BIOPSY     10/03 benign columnar mucosa (limited material)  . KIDNEY SURGERY       SIGNIFICANT EVENTS:  Respiratory  1/23 4pm resp distress on floor, rapid response > intubated and transferred to ICU Started HD 1/26  1/29 intubated for hypoxemic respiratory failure  STUDIES:   CTa  Chest 1/23 1. No CT evidence of acute pulmonary embolism. 2. Moderate severity left upper lobe and right lower lobe atelectasis and/or infiltrate with marked severity consolidation seen throughout the entire right lower lobe. 3. Moderate size bilateral pleural effusions, right greater than Left. CT Head 1/23 no acute cahnge CT head 1/26 (? L side neglect) > no acute changes   CULTURES:  Covid  1/21 neg MRSA pcr  1/23 neg  ET 1/29 >>>  ANTIBIOTICS:  Levaquin 1/24 >>> 1/29 Zosyn 1/29 >>> Vanc 1/29 only   LINES/TUBES:  Oral ET  1/23 L Fem HD 1/25  ETT 1/29>>>1/30  CONSULTANTS:  Nephrology 1/21 PCCM 1/25  VVS 1/30  SUBJECTIVE/interim:  Extubated on 1/30 with some residual stridor started on racemic epi and additional steroids for 2 days only AV fistula and catheter to be addressed by vascular in AM  CONSTITUTIONAL: BP (!) 159/74   Pulse 85   Temp 98.2 F (36.8 C) (Axillary)   Resp (!) 34   Ht 5\' 2"  (1.575 m)   Wt 95.7 kg   SpO2 94%   BMI 38.59 kg/m   I/O last 3 completed shifts: In: 378.9 [I.V.:141.1; NG/GT:130; IV Piggyback:107.8] Out: 1610  [Urine:1610]     Vent Mode: CPAP;PSV FiO2 (%):  [40 %] 40 % Set Rate:  [20 bmp] 20 bmp Vt Set:  [400 mL] 400 mL PEEP:  [5 cmH20] 5 cmH20 Pressure Support:  [5 cmH20] 5 cmH20 Plateau Pressure:  [17 cmH20] 17 cmH20  PHYSICAL EXAM: General:  Chronically ill appearing female, NAD, on [place] Neuro:  A&O, moving all ext to command HEENT: [place]/AT, PERRL, EOM-I and MMM, CPAP mask in place Cardiovascular: RRR, Nl S1/S2 and -M/R/G Lungs: Diminished diffusely R>L, mild stridor remains Abdomen: Soft, NT, ND and +BS Skin: no rash/ skin breakdown  Ext: -edema and -tenderness  I reviewed CXR myself, R>L atelectasis/infiltrate noted   Discussed with bedside RN and RT  RESOLVED PROBLEM LIST   ASSESSMENT AND PLAN    1) Acute resp failure hypoxemic and hypercarbic present on admit  in setting of vol overload, acute renal failure and anasarca/pleural effusions made worse by low albumin  - CPAP at night for OSA, at 5 cmH2O and full face mask ordered - Chronically on steroids so left on stress dose steroids - SLP - Solumedrol 60 q6 x8 doses ordered on 1/30, last dose should be morning of 2/1 - Titrate O2 for sat of 88-92% - Abx for pneumonia - F/u on cultures - On chronic steroids for RA, continue stress dose steroids  2) Acute renal failure/ low albumin > bilateral pleural effusions. - HD per renal - Encourage volume negative - Dr. Vella Redhead from vascular contacted, likely to the OR in AM for ?fistula vs tunneled, likely both - Defer lasix use to renal at this point  3) Acute metabolic encephalopathy secondary to hypercarbia, present on admit. - D/C fentanyl - Minimize sedation at this point  4) Normocytic anemia typical of renal failure > rx per [facility] / renal    Lab Results  Component Value Date   HGB 10.5 (L) 09/16/2019   HGB 8.7 (L) 09/16/2019   HGB 8.2 (L) 09/13/2019  trending better, continue to monitor   5) Thrombocytopnia  on Hep Lab Results  Component Value Date   PLT 220  09/16/2019   PLT 174 09/13/2019   PLT 126 (L) 09/10/2019   trending better, continue to monitor  6) New densities RUL ? Atx vs  HCAP ? Asp - Continue zosyn - D/C vanc, no growth on cultures and MRSA swab negative  SUMMARY OF TODAY'S PLAN:  Hold in the ICU today since going to the OR in AM and will likely come back intubated  Best Practice / Goals of Care / Disposition.   DVT PROPHYLAXIS:  Sub Q hep SUP: PPI NUTRITION:per [facility]  MOBILITY:as tol  GOALS OF CARE: per [facility]  FAMILY DISCUSSIONS: per [facility]  DISPOSITION remain ICU/ on Vent  and transfer to [facility] when bed [facility]   LABS  Glucose Recent Labs  Lab 09/15/19 1107 09/15/19 1513 09/15/19 1931 09/15/19 2329 09/16/19 0349 09/16/19 0716  GLUCAP 145* 180* 171* 214* 198* 191*    BMET Recent Labs  Lab 09/13/19 0341 09/13/19 0341 09/14/19 0507 09/16/19 0243 09/16/19 0356  NA 138   < > 137 141 138  K 3.8   < > 3.2* 3.7 3.6  CL 101  --  97* 102  --   CO2 27  --  27 23  --   BUN 45*  --  32* 55*  --   CREATININE 4.61*  --  3.18* 4.90*  --   GLUCOSE 169*  --  165* 219*  --    < > = values in this interval not displayed.    Liver Enzymes Recent Labs  Lab 09/13/19 0341 09/14/19 0507 09/16/19 0243  ALBUMIN 2.0* 2.1* 1.8*    Electrolytes Recent Labs  Lab 09/10/19 0406 09/11/19 0412 09/13/19 0341 09/14/19 0507 09/16/19 0243  CALCIUM 7.7*   < > 8.1* 8.3* 9.0  MG 2.3  --   --   --  2.2  PHOS 6.5*   < > 4.4 2.9 6.4*   < > = values in this interval not displayed.   CBC Recent Labs  Lab 09/10/19 0406 09/10/19 0406 09/13/19 0341 09/16/19 0243 09/16/19 0356  WBC 10.2  --  10.6* 10.3  --   HGB 7.4*   < > 8.2* 8.7* 10.5*  HCT 22.6*   < > 25.1* 26.8* 31.0*  PLT 126*  --  174 220  --    < > = values in this interval not displayed.   ABG Recent Labs  Lab 09/10/19 0409 09/16/19 0356  PHART 7.412 7.393  PCO2ART 41.0 41.5  PO2ART 66.0* 75.0*   Coag's Recent Labs  Lab 09/09/19 1224  INR 1.1    Sepsis Markers No results for input(s): LATICACIDVEN, PROCALCITON, O2SATVEN in the last 168 hours.  Cardiac Enzymes No results for input(s): TROPONINI, PROBNP in the last 168 hours.   Rush Farmer, M.D. [facility] Pulmonary/Critical Care Medicine.

## 2019-09-16 NOTE — Progress Notes (Signed)
ETT pulled back 2cm per MD order.

## 2019-09-16 NOTE — Progress Notes (Signed)
RT note: patient taken off of CPAP and placed on 4L nasal cannula.  Tolerating well at this time.  Will continue to monitor.

## 2019-09-16 NOTE — Progress Notes (Signed)
RT came to patient room to give 1600 dose of Albuterol and Racemic.  Upon arrival could hear patient's stridor through CPAP mask.  Also noted patient's work of breathing had increased and HR began to elevate.  While treatment was being given patient looked at RT and stated, "Help me."  RT asked patient if she was having difficulty breathing and patient stated yes. Changed CPAP settings from CPAP to bipap at 15/5 and paged MD to come to patient room.  MD came to patient room and patient was intubated without any complications.  Will continue to monitor.

## 2019-09-16 NOTE — Evaluation (Signed)
Clinical/Bedside Swallow Evaluation Patient Details  Name: [name] MRN: [id number] Date of Birth: Mar 27, 1951  Today's Date: 09/16/2019 Time: SLP Start Time (ACUTE ONLY): 0950 SLP Stop Time (ACUTE ONLY): 1008 SLP Time Calculation (min) (ACUTE ONLY): 18 min  Past Medical History:  Past Medical History:  Diagnosis Date  . Anxiety   . Asthma   . Back pain   . Barrett esophagus   . CHF (congestive heart failure) ([place])   . CKD (chronic kidney disease) stage 3, GFR 30-59 ml/min   . COPD (chronic obstructive pulmonary disease) ([place])   . Depression   . Diastolic heart failure ([place])   . Essential hypertension   . Fibromyalgia   . GERD (gastroesophageal reflux disease)   . Gout   . History of pericarditis   . Hyperlipidemia   . Hypothyroid   . Major depressive disorder   . Memory loss   . Nephrolithiasis    2008  . Obesity hypoventilation syndrome (HCC)    CPAP  . Obstructive sleep apnea   . PE (pulmonary embolism)    2010  . Peripheral neuropathy   . PTSD (post-traumatic stress disorder)   . Rheumatoid arthritis ([place])   . Schizophrenia ([place])   . Secondary Parkinson disease ([place]) 10/19/2018  . Steroid dependence ([place])   . Type 2 diabetes mellitus ([place])   . Vitamin D deficiency    Past Surgical History:  Past Surgical History:  Procedure Laterality Date  . APPENDECTOMY    . CHOLECYSTECTOMY    . COLONOSCOPY     benign polyps (12/2000), repeat colonoscopy performed in 2007  . ENDOMETRIAL BIOPSY     10/03 benign columnar mucosa (limited material)  . KIDNEY SURGERY     HPI:  [name] is a 69 y.o. female with worsening acute on chronic renal failure now requiring dialysis via a temporary femoral catheter I placed Monday. The patient came into the hospital with volume overload and worsening renal failure, and did not respond to lasix. She has since been intubated and had issues with fluid overload including SVT. Pt with known hx of oropharyngeal dysphagia with most  recent MBS on 08/17/18 recommending Dysphagia 3 (soft) solids and thin liquids with a chin tuck.  Pt was intubated from 1/23-1/30.    Assessment / Plan / Recommendation Clinical Impression  Pt was seen for a bedside swallow evaluation and she presents with oral dysphagia and suspected pharyngeal dysphagia in the setting of a recent extubation.  Pt with a known hx of oropharyngeal dysphagia with most recent recommendations for Dysphagia 3 (soft) solids and thin liquids with a chin tuck per MBS on 08/17/18.  Pt reported that she has been consuming regular solids and thin liquids at her living facility.  She initially stated that she does not use a chin tuck during liquid consumption, but then she later stated that she does use the chin tuck maneuver.  Pt was observed to have generalized oral weakness, low vocal intensity, hoarse vocal quality, and stridor during inspiration.  Oral cavity was dry with white secretions observed on her hard palate and a white coating on bilateral lingual surface concerning for thrush.  Oral care was completed prior to PO trials.  Pt consumed trials of ice chips, thin liquid via tsp, and puree.  She exhibited good bolus acceptance with appropriate labial closure around the spoon.  Mastication of ice chips was prolonged and AP transport was prolonged with all trials.  Additionally suspect delayed swallow initiation.  No cough/throat clear  was observed with any trials; however, RR slightly increased with all PO trials.  Pt requested to be placed on CPAP following this evaluation, stating that it was easier to breathe with the CPAP on.  RN was made aware.  Recommend continuation of NPO with frequent oral care at this time.  Pt may have a few small ice chips PRN for comfort and to mobilize her swallow musculature following thorough oral care given full RN supervision.  Additionally recommend an instrumental swallow study to further evaluate swallow function.  Will plan for MBS on 09/17/19 if pt  is appropriate.    SLP Visit Diagnosis: Dysphagia, unspecified (R13.10)    Aspiration Risk  Moderate aspiration risk    Diet Recommendation NPO;Ice chips PRN after oral care   Medication Administration: Via alternative means    Other  Recommendations Oral Care Recommendations: Oral care QID;Oral care prior to ice chip/H20;Staff/trained caregiver to provide oral care Other Recommendations: Remove water pitcher;Have oral suction available   Follow up Recommendations (TBD )      Frequency and Duration min 2x/week  2 weeks       Prognosis Prognosis for Safe Diet Advancement: Fair      Swallow Study   General HPI: [name] is a 69 y.o. female with worsening acute on chronic renal failure now requiring dialysis via a temporary femoral catheter I placed Monday. The patient came into the hospital with volume overload and worsening renal failure, and did not respond to lasix. She has since been intubated and had issues with fluid overload including SVT. Pt with known hx of oropharyngeal dysphagia with most recent MBS on 08/17/18 recommending Dysphagia 3 (soft) solids and thin liquids with a chin tuck.  Pt was intubated from 1/23-1/30.  Type of Study: Bedside Swallow Evaluation Previous Swallow Assessment: See HPI Diet Prior to this Study: NPO Temperature Spikes Noted: No Respiratory Status: Nasal cannula History of Recent Intubation: Yes Length of Intubations (days): 6 days Date extubated: 09/15/19 Behavior/Cognition: Alert;Cooperative;Requires cueing Oral Cavity Assessment: Dry;Dried secretions Oral Care Completed by SLP: Yes Oral Cavity - Dentition: Missing dentition(partial dentures at home ) Patient Positioning: Upright in bed Baseline Vocal Quality: Hoarse;Low vocal intensity Volitional Cough: Weak Volitional Swallow: Able to elicit    Oral/Motor/Sensory Function Overall Oral Motor/Sensory Function: Generalized oral weakness Facial ROM: Reduced right;Reduced left Facial  Symmetry: Within Functional Limits Facial Sensation: Within Functional Limits Lingual ROM: Reduced right;Reduced left Lingual Symmetry: Within Functional Limits Lingual Strength: Reduced   Ice Chips Ice chips: Impaired Presentation: Spoon Oral Phase Impairments: Impaired mastication Oral Phase Functional Implications: Prolonged oral transit   Thin Liquid Thin Liquid: Impaired Presentation: Spoon Oral Phase Impairments: Reduced lingual movement/coordination Oral Phase Functional Implications: Prolonged oral transit    Nectar Thick Nectar Thick Liquid: Not tested   Honey Thick Honey Thick Liquid: Not tested   Puree Puree: Impaired Presentation: Spoon Oral Phase Impairments: Reduced lingual movement/coordination Oral Phase Functional Implications: Prolonged oral transit   Solid     Solid: Not tested     Colin Mulders M.S., CCC-SLP Acute Rehabilitation Services Office: [phone]  Elvia Collum Millissa Deese 09/16/2019,10:22 AM

## 2019-09-17 ENCOUNTER — Inpatient Hospital Stay (HOSPITAL_COMMUNITY): Payer: Medicare Other

## 2019-09-17 DIAGNOSIS — R0602 Shortness of breath: Secondary | ICD-10-CM | POA: Diagnosis present

## 2019-09-17 DIAGNOSIS — J810 Acute pulmonary edema: Secondary | ICD-10-CM | POA: Diagnosis not present

## 2019-09-17 DIAGNOSIS — F202 Catatonic schizophrenia: Secondary | ICD-10-CM | POA: Diagnosis not present

## 2019-09-17 DIAGNOSIS — D696 Thrombocytopenia, unspecified: Secondary | ICD-10-CM | POA: Diagnosis not present

## 2019-09-17 DIAGNOSIS — I452 Bifascicular block: Secondary | ICD-10-CM | POA: Diagnosis not present

## 2019-09-17 DIAGNOSIS — J189 Pneumonia, unspecified organism: Secondary | ICD-10-CM | POA: Diagnosis not present

## 2019-09-17 DIAGNOSIS — E8779 Other fluid overload: Secondary | ICD-10-CM | POA: Diagnosis not present

## 2019-09-17 DIAGNOSIS — F203 Undifferentiated schizophrenia: Secondary | ICD-10-CM

## 2019-09-17 DIAGNOSIS — N2581 Secondary hyperparathyroidism of renal origin: Secondary | ICD-10-CM | POA: Diagnosis not present

## 2019-09-17 DIAGNOSIS — J9601 Acute respiratory failure with hypoxia: Secondary | ICD-10-CM | POA: Diagnosis not present

## 2019-09-17 DIAGNOSIS — E662 Morbid (severe) obesity with alveolar hypoventilation: Secondary | ICD-10-CM | POA: Diagnosis not present

## 2019-09-17 DIAGNOSIS — Y95 Nosocomial condition: Secondary | ICD-10-CM | POA: Diagnosis not present

## 2019-09-17 DIAGNOSIS — D631 Anemia in chronic kidney disease: Secondary | ICD-10-CM | POA: Diagnosis not present

## 2019-09-17 DIAGNOSIS — Z20822 Contact with and (suspected) exposure to covid-19: Secondary | ICD-10-CM | POA: Diagnosis not present

## 2019-09-17 DIAGNOSIS — E872 Acidosis: Secondary | ICD-10-CM | POA: Diagnosis not present

## 2019-09-17 DIAGNOSIS — E1122 Type 2 diabetes mellitus with diabetic chronic kidney disease: Secondary | ICD-10-CM | POA: Diagnosis not present

## 2019-09-17 DIAGNOSIS — E1142 Type 2 diabetes mellitus with diabetic polyneuropathy: Secondary | ICD-10-CM | POA: Diagnosis not present

## 2019-09-17 DIAGNOSIS — I959 Hypotension, unspecified: Secondary | ICD-10-CM | POA: Diagnosis not present

## 2019-09-17 DIAGNOSIS — J440 Chronic obstructive pulmonary disease with acute lower respiratory infection: Secondary | ICD-10-CM | POA: Diagnosis not present

## 2019-09-17 DIAGNOSIS — Z6841 Body Mass Index (BMI) 40.0 and over, adult: Secondary | ICD-10-CM | POA: Diagnosis not present

## 2019-09-17 DIAGNOSIS — I5033 Acute on chronic diastolic (congestive) heart failure: Secondary | ICD-10-CM | POA: Diagnosis not present

## 2019-09-17 DIAGNOSIS — N179 Acute kidney failure, unspecified: Secondary | ICD-10-CM | POA: Diagnosis not present

## 2019-09-17 DIAGNOSIS — I480 Paroxysmal atrial fibrillation: Secondary | ICD-10-CM

## 2019-09-17 DIAGNOSIS — I471 Supraventricular tachycardia: Secondary | ICD-10-CM | POA: Diagnosis not present

## 2019-09-17 DIAGNOSIS — N186 End stage renal disease: Secondary | ICD-10-CM

## 2019-09-17 DIAGNOSIS — G9341 Metabolic encephalopathy: Secondary | ICD-10-CM | POA: Diagnosis not present

## 2019-09-17 DIAGNOSIS — I110 Hypertensive heart disease with heart failure: Secondary | ICD-10-CM | POA: Diagnosis not present

## 2019-09-17 DIAGNOSIS — E440 Moderate protein-calorie malnutrition: Secondary | ICD-10-CM | POA: Diagnosis not present

## 2019-09-17 DIAGNOSIS — J9602 Acute respiratory failure with hypercapnia: Secondary | ICD-10-CM | POA: Diagnosis not present

## 2019-09-17 LAB — POCT I-STAT 7, (LYTES, BLD GAS, ICA,H+H)
Acid-Base Excess: 1 mmol/L (ref 0.0–2.0)
Bicarbonate: 25.4 mmol/L (ref 20.0–28.0)
Calcium, Ion: 1.23 mmol/L (ref 1.15–1.40)
HCT: 30 % — ABNORMAL LOW (ref 36.0–46.0)
Hemoglobin: 10.2 g/dL — ABNORMAL LOW (ref 12.0–15.0)
O2 Saturation: 99 %
Patient temperature: 98.4
Potassium: 3.1 mmol/L — ABNORMAL LOW (ref 3.5–5.1)
Sodium: 141 mmol/L (ref 135–145)
TCO2: 27 mmol/L (ref 22–32)
pCO2 arterial: 38.7 mmHg (ref 32.0–48.0)
pH, Arterial: 7.425 (ref 7.350–7.450)
pO2, Arterial: 135 mmHg — ABNORMAL HIGH (ref 83.0–108.0)

## 2019-09-17 LAB — CBC
HCT: 25.4 % — ABNORMAL LOW (ref 36.0–46.0)
Hemoglobin: 8.3 g/dL — ABNORMAL LOW (ref 12.0–15.0)
MCH: 31.9 pg (ref 26.0–34.0)
MCHC: 32.7 g/dL (ref 30.0–36.0)
MCV: 97.7 fL (ref 80.0–100.0)
Platelets: 253 10*3/uL (ref 150–400)
RBC: 2.6 MIL/uL — ABNORMAL LOW (ref 3.87–5.11)
RDW: 13.8 % (ref 11.5–15.5)
WBC: 9.7 10*3/uL (ref 4.0–10.5)
nRBC: 0 % (ref 0.0–0.2)

## 2019-09-17 LAB — GLUCOSE, CAPILLARY
Glucose-Capillary: 124 mg/dL — ABNORMAL HIGH (ref 70–99)
Glucose-Capillary: 157 mg/dL — ABNORMAL HIGH (ref 70–99)
Glucose-Capillary: 176 mg/dL — ABNORMAL HIGH (ref 70–99)
Glucose-Capillary: 191 mg/dL — ABNORMAL HIGH (ref 70–99)
Glucose-Capillary: 217 mg/dL — ABNORMAL HIGH (ref 70–99)
Glucose-Capillary: 265 mg/dL — ABNORMAL HIGH (ref 70–99)
Glucose-Capillary: 266 mg/dL — ABNORMAL HIGH (ref 70–99)
Glucose-Capillary: 94 mg/dL (ref 70–99)

## 2019-09-17 LAB — RENAL FUNCTION PANEL
Albumin: 1.8 g/dL — ABNORMAL LOW (ref 3.5–5.0)
Anion gap: 17 — ABNORMAL HIGH (ref 5–15)
BUN: 74 mg/dL — ABNORMAL HIGH (ref 8–23)
CO2: 23 mmol/L (ref 22–32)
Calcium: 8.8 mg/dL — ABNORMAL LOW (ref 8.9–10.3)
Chloride: 103 mmol/L (ref 98–111)
Creatinine, Ser: 5.5 mg/dL — ABNORMAL HIGH (ref 0.44–1.00)
GFR calc Af Amer: 9 mL/min — ABNORMAL LOW (ref >60)
GFR calc non Af Amer: 7 mL/min — ABNORMAL LOW (ref >60)
Glucose, Bld: 188 mg/dL — ABNORMAL HIGH (ref 70–99)
Phosphorus: 6.7 mg/dL — ABNORMAL HIGH (ref 2.5–4.6)
Potassium: 3.2 mmol/L — ABNORMAL LOW (ref 3.5–5.1)
Sodium: 143 mmol/L (ref 135–145)

## 2019-09-17 LAB — MAGNESIUM: Magnesium: 2.4 mg/dL (ref 1.7–2.4)

## 2019-09-17 MED ORDER — PREDNISONE 10 MG PO TABS: 10.0000 mg | ORAL_TABLET | Freq: Every day | ORAL | Status: DC

## 2019-09-17 MED ORDER — HYDRALAZINE HCL 50 MG PO TABS
50.0000 mg | ORAL_TABLET | Freq: Three times a day (TID) | ORAL | Status: DC
  Administered 2019-09-17 – 2019-09-20 (×9): 50 mg
  Filled 2019-09-17 (×10): qty 1

## 2019-09-17 MED ORDER — CARVEDILOL 25 MG PO TABS
25.0000 mg | ORAL_TABLET | Freq: Two times a day (BID) | ORAL | Status: DC
  Administered 2019-09-17 – 2019-09-20 (×7): 25 mg
  Filled 2019-09-17 (×7): qty 1

## 2019-09-17 MED ORDER — CALCITRIOL 1 MCG/ML PO SOLN
0.2500 ug | ORAL | Status: DC
  Administered 2019-09-17 – 2019-09-19 (×2): 0.25 ug
  Filled 2019-09-17 (×2): qty 0.25

## 2019-09-17 MED ORDER — VITAL 1.5 CAL PO LIQD
1000.0000 mL | ORAL | Status: DC
  Administered 2019-09-17 – 2019-09-19 (×3): 1000 mL
  Filled 2019-09-17 (×6): qty 1000

## 2019-09-17 MED ORDER — METHYLPREDNISOLONE SODIUM SUCC 125 MG IJ SOLR: 60.0000 mg | INTRAMUSCULAR | Status: DC

## 2019-09-17 MED ORDER — METHYLPREDNISOLONE SODIUM SUCC 125 MG IJ SOLR: 60.0000 mg | Freq: Two times a day (BID) | INTRAMUSCULAR | Status: DC

## 2019-09-17 MED ORDER — PRO-STAT SUGAR FREE PO LIQD
30.0000 mL | Freq: Three times a day (TID) | ORAL | Status: DC
  Administered 2019-09-17 – 2019-09-20 (×9): 30 mL
  Filled 2019-09-17 (×9): qty 30

## 2019-09-17 MED ORDER — HYDRALAZINE HCL 50 MG PO TABS: 50.0000 mg | ORAL_TABLET | Freq: Three times a day (TID) | ORAL | Status: DC

## 2019-09-17 NOTE — Progress Notes (Signed)
Upper extremity vein mapping has been completed.   Preliminary results in CV Proc.   [name] 09/17/2019 3:47 PM

## 2019-09-17 NOTE — Progress Notes (Addendum)
PULMONARY / CRITICAL CARE MEDICINE   NAME:  [name], MRN:  [id number], DOB:  August 06, 1951, LOS: 3 ADMISSION DATE:  09/06/2019, CONSULTATION DATE:  1/25 REFERRING MD:  [facility]  CHIEF COMPLAINT:  resp failure   BRIEF HISTORY:     42 yowf remote smoker carries dx of COPD/AB on pred ? How long plus just  saba admitted with progressive wt gain, leg swelling sob with w/u c/w anasarca / bilateral effusions  more sob 1/23 and required intubation / mech with CTa neg PE, positive for mild bilateral pleural effusions with small lung voumes and PCCM asked so see 1/25    HISTORY OF PRESENT ILLNESS   No direct hx availabe but from H/P:  69 y.o. female   HTN, DM (on trulicity), hyperlipidemia, diastolic heart failure, schizoaffective D/O with episodes of catatonia and sz d/o- on primidone/cymbalta and risperdone. She also seems to have CKD- crt in the low to mid 2's in early 2020- then a crt in the system from October 2020 that was 3, followed by [name] as her neurologist who does not believe the patient has frank seizures --Presented to the ED with worsening shortness of breath, worsening lower extremity edema and decreased urine output despite reported  compliance with diuretics at home -- In the ED chest x-rays consistent with CHF -Creatinine admit  up to 6.34 creatinine was 3.1 on 06/11/2019 and creatinine was 2.1 on 10/09/2018 SIGNIFICANT PAST MEDICAL HISTORY    . Anxiety   . Asthma   . Back pain   . Barrett esophagus   . CHF (congestive heart failure) ([place])   . CKD (chronic kidney disease) stage 3, GFR 30-59 ml/min   . COPD (chronic obstructive pulmonary disease) ([place])   . Depression   . Diastolic heart failure ([place])   . Essential hypertension   . Fibromyalgia   . GERD (gastroesophageal reflux disease)   . Gout   . History of pericarditis   . Hyperlipidemia   . Hypothyroid   . Major depressive disorder   . Memory loss   . Nephrolithiasis    2008  .  Obesity hypoventilation syndrome (HCC)    CPAP  . Obstructive sleep apnea   . PE (pulmonary embolism)    2010  . Peripheral neuropathy   . PTSD (post-traumatic stress disorder)   . Rheumatoid arthritis ([place])   . Schizophrenia ([place])   . 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 Steroid dependence ([place])   . Type 2 diabetes mellitus ([place])   . Vitamin D deficiency    . APPENDECTOMY    . CHOLECYSTECTOMY    . COLONOSCOPY     benign polyps (12/2000), repeat colonoscopy performed in 2007  . ENDOMETRIAL BIOPSY     10/03 benign columnar mucosa (limited material)  . KIDNEY SURGERY       SIGNIFICANT EVENTS:  Respiratory  1/23 4pm resp distress on floor, rapid response > intubated and transferred to ICU Started HD 1/26  1/29 intubated for hypoxemic respiratory failure, subsequently extubated 1/31 reintubated for stridor.    STUDIES:   CTa  Chest 1/23 1. No CT evidence of acute pulmonary embolism. 2. Moderate severity left upper lobe and right lower lobe atelectasis and/or infiltrate with marked severity consolidation seen throughout the entire right lower lobe. 3. Moderate size bilateral pleural effusions, right greater than Left. CT Head 1/23 no acute cahnge CT head 1/26 (? L side neglect) > no acute changes   CULTURES:  Covid  1/21 neg MRSA  pcr  1/23 neg  ET 1/29 >>>  ANTIBIOTICS:  Levaquin 1/24 >>> 1/29 Zosyn 1/29 >>> Vanc 1/29 only   LINES/TUBES:  Oral ET  1/23 L Fem HD 1/25  ETT 1/29>>>1/30  CONSULTANTS:  Nephrology 1/21 PCCM 1/25  VVS 1/30  SUBJECTIVE/interim:  Reintubated the afternoon of 1/31 for stridor despite receiving rounds of racemic epi and steroids.   CONSTITUTIONAL: BP 134/75   Pulse 74   Temp 98.2 F (36.8 C) (Axillary)   Resp 20   Ht 5\' 2"  (1.575 m)   Wt 90.5 kg   SpO2 100%   BMI 36.49 kg/m   I/O last 3 completed shifts: In: 612.3 [I.V.:352.1; IV Piggyback:260.2] Out: 1570 [Urine:1510; Emesis/NG  output:60]     Vent Mode: PRVC FiO2 (%):  [50 %-100 %] 50 % Set Rate:  [20 bmp] 20 bmp Vt Set:  [400 mL] 400 mL PEEP:  [5 cmH20] 5 cmH20 Plateau Pressure:  [15 cmH20-19 cmH20] 15 cmH20  PHYSICAL EXAM: General:  Chronically ill appearing female, NAD, intubated, sedated, calm Neuro:  Alert, moving all ext to command HEENT: sclera anicteric. ETT to vent Cardiovascular: irregularly irregular, HR in 70s no M/R/G Lungs: Diminished diffusely, no wheezes Abdomen: Soft, NT, ND and +BS Skin: no rash/ skin breakdown  Ext: +edema   I reviewed CXR myself, worsening bilateral pulmonary edema   Discussed with bedside RN and nephrology.   ASSESSMENT AND PLAN    1) Acute resp failure hypoxemic and hypercarbic present on admit  in setting of vol overload, acute renal failure and anasarca/pleural effusions made worse by low albumin  - third intubation on 1/31 this admission - on chronic steroids for RA at home.  - has been on solumedrol and hydrocortisone.  - will stop hydrocortisone today. Will taper methylpred to resume home RA prednisone dosing.  - weaning ventilator settings to start PS trials - Would not extubate until substantially net negative volume status likely contributed to her stridor.   2) Acute renal failure/ low albumin > bilateral pleural effusions. - HD per renal - vascular following for tunneled line placement - stop free water  3) Acute metabolic encephalopathy secondary to hypercarbia, present on admit. - fentanyl gtt.  - Minimize sedation  4) Normocytic anemia typical of renal failure > rx per [facility] / renal    Lab Results  Component Value Date   HGB 8.3 (L) 09/17/2019   HGB 10.2 (L) 09/17/2019   HGB 10.5 (L) 09/16/2019  trending better, continue to monitor   5) Thrombocytopnia  on Hep Lab Results  Component Value Date   PLT 253 09/17/2019   PLT 220 09/16/2019   PLT 174 09/13/2019   trending better, continue to monitor  6) HCAP - d/c abx  Received 8 days  of abx (levaquin and merrem) for adequate treatment.   7) Atrial Fibrillation - currently rate controlled - apparently new during this admission after extubation trial.  - monitor  - will need to discuss long term AC prior to discharge  The patient is critically ill with multiple organ systems failure and requires high complexity decision making for assessment and support, frequent evaluation and titration of therapies, application of advanced monitoring technologies and extensive interpretation of multiple databases.   Critical Care Time devoted to patient care services described in this note is 35 minutes. This time reflects time of care of this [place] . This critical care time does not reflect separately billable procedures or procedure time, teaching time or  supervisory time of PA/NP/Med student/Med Resident etc but could involve care discussion time.  [facility]one [facility] 09/17/2019 8:30 AM  Pager: [phone] After hours pager: [phone]   Best Practice / Goals of Care / Disposition.   DVT PROPHYLAXIS:  Sub Q hep SUP: PPI NUTRITION: will consult nutrition GLUCOSE: less than 180 MOBILITY: bed rest for now. GOALS OF CARE: currently full code FAMILY DISCUSSIONS: no family - has legal guardian in group home.  DISPOSITION remain ICU  LABS  Glucose Recent Labs  Lab 09/16/19 1106 09/16/19 1509 09/16/19 1934 09/16/19 2342 09/17/19 0359 09/17/19 0802  GLUCAP 199* 199* 248* 217* 176* 157*    BMET Recent Labs  Lab 09/14/19 0507 09/14/19 0507 09/16/19 0243 09/16/19 0243 09/16/19 0356 09/17/19 0320 09/17/19 0409  NA 137   < > 141   < > 138 141 143  K 3.2*   < > 3.7   < > 3.6 3.1* 3.2*  CL 97*  --  102  --   --   --  103  CO2 27  --  23  --   --   --  23  BUN 32*  --  55*  --   --   --  74*  CREATININE 3.18*  --  4.90*  --   --   --  5.50*  GLUCOSE 165*  --  219*  --   --   --  188*   < > = values in this  interval not displayed.    Liver Enzymes Recent Labs  Lab 09/14/19 0507 09/16/19 0243 09/17/19 0409  ALBUMIN 2.1* 1.8* 1.8*    Electrolytes Recent Labs  Lab 09/14/19 0507 09/16/19 0243 09/17/19 0409  CALCIUM 8.3* 9.0 8.8*  MG  --  2.2 2.4  PHOS 2.9 6.4* 6.7*   CBC Recent Labs  Lab 09/13/19 0341 09/13/19 0341 09/16/19 0243 09/16/19 0243 09/16/19 0356 09/17/19 0320 09/17/19 0409  WBC 10.6*  --  10.3  --   --   --  9.7  HGB 8.2*   < > 8.7*   < > 10.5* 10.2* 8.3*  HCT 25.1*   < > 26.8*   < > 31.0* 30.0* 25.4*  PLT 174  --  220  --   --   --  253   < > = values in this interval not displayed.   ABG Recent Labs  Lab 09/16/19 0356 09/17/19 0320  PHART 7.393 7.425  PCO2ART 41.5 38.7  PO2ART 75.0* 135.0*

## 2019-09-17 NOTE — Progress Notes (Signed)
PT Cancellation Note  Patient Details Name: [name] MRN: [id number] DOB: May 27, 1951   Cancelled Treatment:    Reason Eval/Treat Not Completed: Medical issues which prohibited therapy, patient re-intubated 1/31 and with non- tunneled Femoral HD catheter. Will follow for appropriateness for PT evaluation.    Reginia Naas 09/17/2019, 9:08 AM  Magda Kiel, [place] [phone] 09/17/2019

## 2019-09-17 NOTE — Progress Notes (Signed)
Nutrition Follow-up  DOCUMENTATION CODES:   Obesity unspecified  INTERVENTION:    Vital 1.5 at 35 ml/h (840 ml per day)   Pro-stat 30 ml TID   Provides 1560 kcal, 102 gm protein, 642 ml free water daily  NUTRITION DIAGNOSIS:   Inadequate oral intake related to inability to eat as evidenced by NPO status.  Ongoing   GOAL:   Provide needs based on ASPEN/SCCM guidelines   Progressing   MONITOR:   Vent status, Labs, Weight trends, Skin, I & O's  REASON FOR ASSESSMENT:   Consult Enteral/tube feeding initiation and management  ASSESSMENT:   70 yo female admitted to [facility] 1/21 with weight gain, swelling, SOB. Intubated 1/23. Transferred to [facility] on 1/29 for placement of permanent HD access. PMH includes schizophrenia, Barrett esophagus, Vitamin D deficiency, peripheral neuropathy, OHS, CHF, COPD, DM-2, HTN, CKD-3, PTSD.   Started HD on 1/26.  Extubated 1/30 and required re-intubation 1/31 due to stridor. Now with bilateral pleural effusions. Remains volume overloaded.  Patient is receiving HD at bedside today.   Patient is currently intubated on ventilator support MV: 8.9 L/min Temp (24hrs), Avg:98.5 F (36.9 C), Min:98.2 F (36.8 C), Max:98.7 F (37.1 C)  Cleviprex has been discontinued.   Labs reviewed. K 3.2 (L), BUN 74 (H), creatinine 5.5 (H), phos 6.7 (H) CBG's: [phone]  Medications reviewed and include calcitriol, vitamin D3, colace, ferric citrate, lasix, solu-cortef, novolog, lantus, MVI, vitamin B6.   Weight trending down, 90.5 kg today, 104.3 kg on admission 1/21.  NUTRITION - FOCUSED PHYSICAL EXAM:    Most Recent Value  Orbital Region  No depletion  Upper Arm Region  No depletion  Thoracic and Lumbar Region  No depletion  Buccal Region  No depletion  Temple Region  No depletion  Clavicle Bone Region  No depletion  Clavicle and Acromion Bone Region  No depletion  Scapular Bone Region  Unable to assess  Dorsal Hand  Unable to assess   Patellar Region  No depletion  Anterior Thigh Region  No depletion  Posterior Calf Region  No depletion  Edema (RD Assessment)  Moderate  Hair  Reviewed  Eyes  Unable to assess  Mouth  Unable to assess  Skin  Reviewed  Nails  Unable to assess       Diet Order:   Diet Order            Diet NPO time specified  Diet effective now              EDUCATION NEEDS:   No education needs have been identified at this time  Skin:  Skin Assessment: Reviewed RN Assessment(R neck incision)  Last BM:  1/31  Height:   Ht Readings from Last 1 Encounters:  09/11/19 5\' 2"  ([id number] m)    Weight:   Wt Readings from Last 1 Encounters:  09/17/19 90.5 kg    Ideal Body Weight:  50 kg  BMI:  Body mass index is 36.49 kg/m.  Estimated Nutritional Needs:   Kcal:  1300-1600  Protein:  100 gm  Fluid:  UOP + 1 L    Molli Barrows, RD, LDN, CNSC Pager [phone] After Hours Pager [phone]

## 2019-09-17 NOTE — Progress Notes (Signed)
SLP Cancellation Note  Patient Details Name: [name] MRN: [id number] DOB: 19-Mar-1951   Cancelled treatment:       Reason Eval/Treat Not Completed: Patient not medically ready. Pt is currently orally intubated. Will follow for improvement and appropriateness for ST intervention.  Paraskevi Funez B. Quentin Ore, [facility] [place] Speech Language Pathologist Office: [phone] Pager: [phone]  Shonna Chock 09/17/2019, 9:05 AM

## 2019-09-17 NOTE — Progress Notes (Signed)
Called elink, informed nurse Kathlee Nations of pt potassium level 3.2. Kathlee Nations states md aware, no replacement to be done at this time per md d/t pt renal status

## 2019-09-17 NOTE — Progress Notes (Signed)
250 mL bag of Fentanyl wasted with Cydney Ok, RN. Bag was expired.

## 2019-09-17 NOTE — Progress Notes (Signed)
VAST consulted to obtain 2nd IV access. Pt with generalized edema. Assessed arms and placed 20g 1.88 in in left anterior forearm utilizing ultrasound. Due to diaphoretic nature, used SecurePort IV at IV insertion site and beneath dressing to help secure line. Educated pt's nurse to pass along information and ensure adhesive remover is used when dsg is removed and/or IV is discontinued. Also suggested arm be measured 2 inches above PIV insertion site regularly to assess for possible infiltration since current swelling makes is difficult to assess. Pt's nurse verbalized comprehension of information shared.

## 2019-09-17 NOTE — Progress Notes (Signed)
OT Cancellation Note  Patient Details Name: [name] MRN: [id number] DOB: [birth year]   Cancelled Treatment:    Reason Eval/Treat Not Completed: Medical issues which prohibited therapy. Pt with L femoral non tunneled HD catheter and intubated 09/15/2009. Will follow pt for appropriateness of evaluation.  [facility]  OTR/L Acute [employer] Pager [phone] Office [phone]     09/17/2019, 7:49 AM

## 2019-09-17 NOTE — Progress Notes (Signed)
[facility] Progress Note   Assessment/ Plan:   1. Acute hypoxic RF: Reintubated yesterday for stridor.  Per PCCM 2. AKI: On HD, started 1/27.  Next planned for HD today.  Will need access- VVS has been consulted, appreciate assistance 3. 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 Nutrition: renal diet when eating 6. Hypertension: expect to improve with UF  Subjective:    Seen in room.  About to start HD.  Reintubated for stridor yesterday.   Objective:   BP 103/65   Pulse 70   Temp 98.2 F (36.8 C) (Axillary)   Resp 20   Ht '5\' 2"'$  (1.575 m)   Wt 90.5 kg   SpO2 99%   BMI 36.49 kg/m   Physical Exam: Gen: agitated on the vent CVS: RRR  Resp:coarse mech breath sounds bilaterally Abd: nondistended Ext: + flank and sacral edema ACCESS: L fem nontunneled HD cath  Labs: BMET Recent Labs  Lab 09/11/19 0412 09/11/19 0412 09/12/19 0403 09/13/19 0341 09/14/19 0507 09/16/19 0243 09/16/19 0356 09/17/19 0320 09/17/19 0409  NA 141   < > 138 138 137 141 138 141 143  K 3.6   < > 3.3* 3.8 3.2* 3.7 3.6 3.1* 3.2*  CL 105  --  102 101 97* 102  --   --  103  CO2 23  --  '24 27 27 23  '$ --   --  23  GLUCOSE 73  --  131* 169* 165* 219*  --   --  188*  BUN 73*  --  58* 45* 32* 55*  --   --  74*  CREATININE 7.50*  --  6.36* 4.61* 3.18* 4.90*  --   --  5.50*  CALCIUM 7.7*  --  7.9* 8.1* 8.3* 9.0  --   --  8.8*  PHOS 7.5*  --  6.5* 4.4 2.9 6.4*  --   --  6.7*   < > = values in this interval not displayed.   CBC Recent Labs  Lab 09/13/19 0341 09/13/19 0341 09/16/19 0243 09/16/19 0356 09/17/19 0320 09/17/19 0409  WBC 10.6*  --  10.3  --   --  9.7  HGB 8.2*   < > 8.7* 10.5* 10.2* 8.3*  HCT 25.1*   < > 26.8* 31.0* 30.0* 25.4*  MCV 101.6*  --  99.3  --   --  97.7  PLT 174  --  220  --   --  253   < > = values in this interval not displayed.      Medications:    . acetaminophen  325 mg Per NG tube BID  . aspirin  81 mg Per NG  tube q morning - 10a  . atorvastatin  20 mg Per NG tube q1800  . calcitRIOL  0.25 mcg Per Tube Q M,W,F  . chlorhexidine gluconate (MEDLINE KIT)  15 mL Mouth Rinse BID  . Chlorhexidine Gluconate Cloth  6 each Topical Daily  . Chlorhexidine Gluconate Cloth  6 each Topical Q0600  . cholecalciferol  4,000 Units Per NG tube q morning - 10a  . darbepoetin (ARANESP) injection - NON-DIALYSIS  150 mcg Subcutaneous Q Thu-1800  . docusate  100 mg Per Tube BID  . feeding supplement (PRO-STAT SUGAR FREE 64)  30 mL Per Tube BID  . fentaNYL (SUBLIMAZE) injection  25 mcg Intravenous Once  . ferric citrate  420 mg Oral TID WC  . furosemide  80 mg Intravenous Q12H  .  heparin injection (subcutaneous)  5,000 Units Subcutaneous Q8H  . hydrocortisone sodium succinate  50 mg Intravenous Q6H  . influenza vaccine adjuvanted  0.5 mL Intramuscular Tomorrow-1000  . insulin aspart  0-6 Units Subcutaneous Q4H  . insulin glargine  16 Units Subcutaneous QHS  . levothyroxine  25 mcg Per NG tube Q0600  . mouth rinse  15 mL Mouth Rinse 10 times per day  . [START ON 09/18/2019] methylPREDNISolone (SOLU-MEDROL) injection  60 mg Intravenous Q12H  . [START ON 09/19/2019] methylPREDNISolone (SOLU-MEDROL) injection  60 mg Intravenous Q24H  . metoprolol tartrate  12.5 mg Per Tube BID  . montelukast  10 mg Per Tube QHS  . multivitamin  15 mL Per Tube Daily  . neomycin-bacitracin-polymyxin  1 application Topical Daily  . pantoprazole sodium  40 mg Per Tube Daily  . [START ON 09/20/2019] predniSONE  10 mg Per Tube Q breakfast  . primidone  50 mg Per NG tube q morning - 10a  . pyridOXINE  200 mg Per NG tube BID  . risperiDONE  0.5 mg Per NG tube BID  . sodium chloride flush  3 mL Intravenous Q12H  . traZODone  50 mg Per NG tube Daily     Madelon Lips, MD 09/17/2019, 10:50 AM

## 2019-09-18 DIAGNOSIS — R061 Stridor: Secondary | ICD-10-CM | POA: Diagnosis not present

## 2019-09-18 DIAGNOSIS — R0602 Shortness of breath: Secondary | ICD-10-CM | POA: Diagnosis not present

## 2019-09-18 DIAGNOSIS — J810 Acute pulmonary edema: Secondary | ICD-10-CM | POA: Diagnosis not present

## 2019-09-18 DIAGNOSIS — G9341 Metabolic encephalopathy: Secondary | ICD-10-CM | POA: Diagnosis not present

## 2019-09-18 DIAGNOSIS — M059 Rheumatoid arthritis with rheumatoid factor, unspecified: Secondary | ICD-10-CM

## 2019-09-18 DIAGNOSIS — N179 Acute kidney failure, unspecified: Secondary | ICD-10-CM | POA: Diagnosis not present

## 2019-09-18 DIAGNOSIS — E8779 Other fluid overload: Secondary | ICD-10-CM | POA: Diagnosis not present

## 2019-09-18 LAB — RENAL FUNCTION PANEL
Albumin: 1.9 g/dL — ABNORMAL LOW (ref 3.5–5.0)
Anion gap: 10 (ref 5–15)
BUN: 48 mg/dL — ABNORMAL HIGH (ref 8–23)
CO2: 26 mmol/L (ref 22–32)
Calcium: 8.4 mg/dL — ABNORMAL LOW (ref 8.9–10.3)
Chloride: 102 mmol/L (ref 98–111)
Creatinine, Ser: 3.04 mg/dL — ABNORMAL HIGH (ref 0.44–1.00)
GFR calc Af Amer: 18 mL/min — ABNORMAL LOW (ref >60)
GFR calc non Af Amer: 15 mL/min — ABNORMAL LOW (ref >60)
Glucose, Bld: 317 mg/dL — ABNORMAL HIGH (ref 70–99)
Phosphorus: 3.5 mg/dL (ref 2.5–4.6)
Potassium: 3.5 mmol/L (ref 3.5–5.1)
Sodium: 138 mmol/L (ref 135–145)

## 2019-09-18 LAB — GLUCOSE, CAPILLARY
Glucose-Capillary: 166 mg/dL — ABNORMAL HIGH (ref 70–99)
Glucose-Capillary: 215 mg/dL — ABNORMAL HIGH (ref 70–99)
Glucose-Capillary: 264 mg/dL — ABNORMAL HIGH (ref 70–99)
Glucose-Capillary: 275 mg/dL — ABNORMAL HIGH (ref 70–99)
Glucose-Capillary: 276 mg/dL — ABNORMAL HIGH (ref 70–99)
Glucose-Capillary: 294 mg/dL — ABNORMAL HIGH (ref 70–99)

## 2019-09-18 MED ORDER — CHLORHEXIDINE GLUCONATE CLOTH 2 % EX PADS
6.0000 | MEDICATED_PAD | Freq: Every day | CUTANEOUS | Status: DC
  Administered 2019-09-18 – 2019-10-01 (×12): 6 via TOPICAL

## 2019-09-18 MED ORDER — INSULIN ASPART 100 UNIT/ML ~~LOC~~ SOLN
0.0000 [IU] | SUBCUTANEOUS | Status: DC
  Administered 2019-09-18 (×2): 5 [IU] via SUBCUTANEOUS
  Administered 2019-09-18: 2 [IU] via SUBCUTANEOUS
  Administered 2019-09-18: 5 [IU] via SUBCUTANEOUS
  Administered 2019-09-18 – 2019-09-19 (×2): 3 [IU] via SUBCUTANEOUS
  Administered 2019-09-19 (×2): 5 [IU] via SUBCUTANEOUS
  Administered 2019-09-19 (×2): 2 [IU] via SUBCUTANEOUS
  Administered 2019-09-20: 1 [IU] via SUBCUTANEOUS
  Administered 2019-09-20: 3 [IU] via SUBCUTANEOUS
  Administered 2019-09-20: 2 [IU] via SUBCUTANEOUS
  Administered 2019-09-20: 1 [IU] via SUBCUTANEOUS
  Administered 2019-09-20: 3 [IU] via SUBCUTANEOUS

## 2019-09-18 MED ORDER — INSULIN ASPART 100 UNIT/ML ~~LOC~~ SOLN
4.0000 [IU] | SUBCUTANEOUS | Status: DC
  Administered 2019-09-18 – 2019-09-20 (×14): 4 [IU] via SUBCUTANEOUS

## 2019-09-18 MED ORDER — PREDNISONE 10 MG PO TABS
10.0000 mg | ORAL_TABLET | Freq: Every day | ORAL | Status: DC
  Administered 2019-09-18 – 2019-09-20 (×3): 10 mg
  Filled 2019-09-18 (×3): qty 1

## 2019-09-18 MED ORDER — FENTANYL CITRATE (PF) 100 MCG/2ML IJ SOLN
25.0000 ug | INTRAMUSCULAR | Status: DC | PRN
  Administered 2019-09-19 (×2): 25 ug via INTRAVENOUS
  Filled 2019-09-18: qty 2

## 2019-09-18 MED ORDER — FENTANYL CITRATE (PF) 100 MCG/2ML IJ SOLN
25.0000 ug | INTRAMUSCULAR | Status: DC | PRN
  Administered 2019-09-19: 100 ug via INTRAVENOUS
  Administered 2019-09-20: 25 ug via INTRAVENOUS
  Filled 2019-09-18 (×2): qty 2

## 2019-09-18 NOTE — Progress Notes (Signed)
Inpatient Diabetes Program Recommendations  AACE/ADA: New Consensus Statement on Inpatient Glycemic Control (2015)  Target Ranges:  Prepandial:   less than 140 mg/dL      Peak postprandial:   less than 180 mg/dL (1-2 hours)      Critically ill patients:  140 - 180 mg/dL   Lab Results  Component Value Date   GLUCAP 264 (H) 09/18/2019   HGBA1C 5.2 09/06/2019    Review of Glycemic Control Results for [name], [name] (MRN [id number]) as of 09/18/2019 10:09  Ref. Range 09/17/2019 11:01 09/17/2019 15:15 09/17/2019 19:11 09/17/2019 23:09 09/18/2019 03:06 09/18/2019 07:18  Glucose-Capillary Latest Ref Range: 70 - 99 mg/dL 124 (H) 191 (H) 265 (H) 266 (H) 294 (H) 264 (H)   Diabetes history: DM 2 Outpatient Diabetes medications: Trulicity 1.5 mg weekly, Tresiba 18 units q HS, Tradjenta 5 mg daily Current orders for Inpatient glycemic control:  Novolog sensitive q 4 hours Vital 35 cc/hr Lantus 16 units q HS Inpatient Diabetes Program Recommendations:   Note blood sugars>goal.  Solucortef now stopped so CBG's may improve.  However if CBG's continue to be > goal, consider adding Novolog tube feed coverage 2 units q 4 hours.   Adah Perl, RN, BC-ADM Inpatient Diabetes Coordinator Pager [phone] (8a-5p)

## 2019-09-18 NOTE — Progress Notes (Signed)
PT Cancellation Note  Patient Details Name: [name] MRN: [id number] DOB: [birth year]   Cancelled Treatment:    Reason Eval/Treat Not Completed: Medical issues which prohibited therapy. Pt remains intubated with non-tunneled femoral cath, minimal sedation. Will follow-up for PT Evaluation when temporary femoral cath removed allowing for LE and OOB mobility.  Mabeline Caras, PT, DPT Acute Rehabilitation Services  Pager [phone] Office [place] 09/18/2019, 8:00 AM

## 2019-09-18 NOTE — Progress Notes (Signed)
Renal Navigator received call from [facility]/Carrie who requested update on patient. Navigator provided update that patient remains intubated in ICU at this time. We will touch base every other day at this time. Per Morey Hummingbird, patient's schedule at [facility] is MWF at 10:45am.  Renal Navigator will continue to follow.  Alphonzo Cruise, [place] Renal Navigator [phone]

## 2019-09-18 NOTE — Progress Notes (Signed)
PULMONARY / CRITICAL CARE MEDICINE   NAME:  [name], MRN:  [id number], DOB:  Dec 21, 1950, LOS: 3 ADMISSION DATE:  09/06/2019, CONSULTATION DATE:  1/25 REFERRING MD:  [facility]  CHIEF COMPLAINT:  resp failure   BRIEF HISTORY:     51 yowf remote smoker carries dx of COPD/AB on pred ? How long plus just  saba admitted with progressive wt gain, leg swelling sob with w/u c/w anasarca / bilateral effusions  more sob 1/23 and required intubation / mech with CTa neg PE, positive for mild bilateral pleural effusions with small lung voumes and PCCM asked so see 1/25    HISTORY OF PRESENT ILLNESS   No direct hx availabe but from H/P:  69 y.o. female   HTN, DM (on trulicity), hyperlipidemia, diastolic heart failure, schizoaffective D/O with episodes of catatonia and sz d/o- on primidone/cymbalta and risperdone. She also seems to have CKD- crt in the low to mid 2's in early 2020- then a crt in the system from October 2020 that was 3, followed by [name] as her neurologist who does not believe the patient has frank seizures --Presented to the ED with worsening shortness of breath, worsening lower extremity edema and decreased urine output despite reported  compliance with diuretics at home -- In the ED chest x-rays consistent with CHF -Creatinine admit  up to 6.34 creatinine was 3.1 on 06/11/2019 and creatinine was 2.1 on 10/09/2018 SIGNIFICANT PAST MEDICAL HISTORY    . Anxiety   . Asthma   . Back pain   . Barrett esophagus   . CHF (congestive heart failure) ([place])   . CKD (chronic kidney disease) stage 3, GFR 30-59 ml/min   . COPD (chronic obstructive pulmonary disease) ([place])   . Depression   . Diastolic heart failure ([place])   . Essential hypertension   . Fibromyalgia   . GERD (gastroesophageal reflux disease)   . Gout   . History of pericarditis   . Hyperlipidemia   . Hypothyroid   . Major depressive disorder   . Memory loss   . Nephrolithiasis    2008  .  Obesity hypoventilation syndrome (HCC)    CPAP  . Obstructive sleep apnea   . PE (pulmonary embolism)    2010  . Peripheral neuropathy   . PTSD (post-traumatic stress disorder)   . Rheumatoid arthritis ([place])   . Schizophrenia ([place])   . 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 Steroid dependence ([place])   . Type 2 diabetes mellitus ([place])   . Vitamin D deficiency    . APPENDECTOMY    . CHOLECYSTECTOMY    . COLONOSCOPY     benign polyps (12/2000), repeat colonoscopy performed in 2007  . ENDOMETRIAL BIOPSY     10/03 benign columnar mucosa (limited material)  . KIDNEY SURGERY       SIGNIFICANT EVENTS:  Respiratory  1/23 4pm resp distress on floor, rapid response > intubated and transferred to ICU Started HD 1/26  1/29 intubated for hypoxemic respiratory failure, subsequently extubated 1/31 reintubated for stridor.    STUDIES:   CTa  Chest 1/23 1. No CT evidence of acute pulmonary embolism. 2. Moderate severity left upper lobe and right lower lobe atelectasis and/or infiltrate with marked severity consolidation seen throughout the entire right lower lobe. 3. Moderate size bilateral pleural effusions, right greater than Left. CT Head 1/23 no acute cahnge CT head 1/26 (? L side neglect) > no acute changes   CULTURES:  Covid  1/21 neg MRSA  pcr  1/23 neg  ET 1/29 >>>  ANTIBIOTICS:  Levaquin 1/24 >>> 1/29 Zosyn 1/29 >>> Vanc 1/29 only   LINES/TUBES:  Oral ET  1/23 L Fem HD 1/25  ETT 1/29>>>1/30  CONSULTANTS:  Nephrology 1/21 PCCM 1/25  VVS 1/30  SUBJECTIVE/interim:  No overnight issues. Received HD yesterday -2LNC. Currently NSR  CONSTITUTIONAL: BP 135/65   Pulse 65   Temp 98 F (36.7 C) (Oral)   Resp 20   Ht 5\' 2"  (1.575 m)   Wt 89.2 kg   SpO2 97%   BMI 35.97 kg/m   I/O last 3 completed shifts: In: 1905.6 [I.V.:735.8; Other:8; NG/GT:1161.8] Out: 2921 [Urine:921; Other:2000]     Vent Mode: PRVC FiO2 (%):  [40 %] 40  % Set Rate:  [20 bmp] 20 bmp Vt Set:  [400 mL] 400 mL PEEP:  [5 cmH20] 5 cmH20 Plateau Pressure:  [14 cmH20-16 cmH20] 14 cmH20  PHYSICAL EXAM: General:  Chronically ill appearing female, NAD, intubated, sedated, calm Neuro:  Alert, moving all ext to command HEENT: sclera anicteric. ETT to vent Cardiovascular: irregularly irregular, HR in 70s no M/R/G Lungs: Diminished diffusely, no wheezes Abdomen: Soft, NT, ND and +BS Skin: no rash/ skin breakdown  Ext: +edema  Neuro: follows commands.   I reviewed CXR myself, worsening bilateral pulmonary edema   Discussed with bedside RN and nephrology.   ASSESSMENT AND PLAN    1) Acute resp failure hypoxemic and hypercarbic present on admit  in setting of vol overload, acute renal failure and anasarca/pleural effusions made worse by low albumin  - third intubation on 1/31 this admission - on chronic steroids for RA at home. Had been previously receiving high dose steroids for stridor. Will d/c today and resume home 10 mg prednisone.  - weaning ventilator settings to start PS trials - Would not extubate until substantially net negative volume status likely contributed to her stridor.   2) Acute renal failure/ low albumin > bilateral pleural effusions. - HD per renal - vascular following for tunneled line placement - lasix per renal - will discuss ongoing need for this.   3) Acute metabolic encephalopathy secondary to hypercarbia, present on admit. - stop fentanyl, precedex only.  - Minimize sedation as able.   4) HCAP - d/c abx  Received 8 days of abx (levaquin and merrem) for adequate treatment.   5) Atrial Fibrillation, paroxysmal - currently rate controlled and in NSR.  - apparently new during this admission after extubation trial.  - monitor  - will need to discuss long term AC prior to discharge if persists.  Other active issues: Anemia of chronic disease - secondary to renal failure   The patient is critically ill with  multiple organ systems failure and requires high complexity decision making for assessment and support, frequent evaluation and titration of therapies, application of advanced monitoring technologies and extensive interpretation of multiple databases.   Critical Care Time devoted to patient care services described in this note is 31 minutes. This time reflects time of care of this [place] . This critical care time does not reflect separately billable procedures or procedure time, teaching time or supervisory time of PA/NP/Med student/Med Resident etc but could involve care discussion time.  [facility] 09/18/2019 8:34 AM  Pager: [phone] After hours pager: [phone]   Best Practice / Goals of Care / Disposition.   DVT PROPHYLAXIS:  Sub Q hep SUP: PPI NUTRITION: tube feeds at goal.  FOLEY: discontinue, will bladder scan prn with pure wick. GLUCOSE: less than 180 MOBILITY: bed rest for now. GOALS OF CARE: currently full code FAMILY DISCUSSIONS: no family - has legal guardian in group home.  DISPOSITION remain ICU  LABS  Glucose Recent Labs  Lab 09/17/19 1101 09/17/19 1515 09/17/19 1911 09/17/19 2309 09/18/19 0306 09/18/19 0718  GLUCAP 124* 191* 265* 266* 294* 264*    BMET Recent Labs  Lab 09/16/19 0243 09/16/19 0356 09/17/19 0320 09/17/19 0409 09/18/19 0314  NA 141   < > 141 143 138  K 3.7   < > 3.1* 3.2* 3.5  CL 102  --   --  103 102  CO2 23  --   --  23 26  BUN 55*  --   --  74* 48*  CREATININE 4.90*  --   --  5.50* 3.04*  GLUCOSE 219*  --   --  188* 317*   < > = values in this interval not displayed.    Liver Enzymes Recent Labs  Lab 09/16/19 0243 09/17/19 0409 09/18/19 0314  ALBUMIN 1.8* 1.8* 1.9*    Electrolytes Recent Labs  Lab 09/16/19 0243 09/17/19 0409 09/18/19 0314  CALCIUM 9.0 8.8* 8.4*  MG 2.2 2.4  --   PHOS 6.4* 6.7* 3.5   CBC Recent Labs  Lab 09/13/19 0341  09/13/19 0341 09/16/19 0243 09/16/19 0243 09/16/19 0356 09/17/19 0320 09/17/19 0409  WBC 10.6*  --  10.3  --   --   --  9.7  HGB 8.2*   < > 8.7*   < > 10.5* 10.2* 8.3*  HCT 25.1*   < > 26.8*   < > 31.0* 30.0* 25.4*  PLT 174  --  220  --   --   --  253   < > = values in this interval not displayed.   ABG Recent Labs  Lab 09/16/19 0356 09/17/19 0320  PHART 7.393 7.425  PCO2ART 41.5 38.7  PO2ART 75.0* 135.0*

## 2019-09-18 NOTE — Progress Notes (Signed)
I reached out to the vascular surgeon, Dr. Donzetta Matters, to see if there are plans for the patient to receive her tunneled HD catheter now that the patient has had her upper extremity vein mapping done. Per Dr. Donzetta Matters, he is waiting for the patient to be extubated before proceeding with the placement of the tunnleld HD catheter. For now we will continue using the left femoral HD catheter. Critical care physician Dr. Shearon Stalls made aware.   Dewaine Oats, RN

## 2019-09-18 NOTE — Progress Notes (Signed)
OT Cancellation Note  Patient Details Name: [name] MRN: [id number] DOB: 1951-03-08   Cancelled Treatment:    Reason Eval/Treat Not Completed: Medical issues which prohibited therapy. Intubated, sedated, and with non- tunneled Femoral HD catheter. Will hold and return as schedule allows.   [place], OTR/L Acute Rehab Pager: [phone] Office: [phone] 09/18/2019, 1:15 PM

## 2019-09-18 NOTE — Progress Notes (Signed)
[facility] Progress Note   Assessment/ Plan:   1. Acute hypoxic RF: Reintubated 1/31 for stridor.  Per PCCM 2. AKI: On HD, started 1/27.  HD 2/1. Next planned for HD 2/3.  Will need access- VVS has been consulted, appreciate assistance.  Plans on hold now while pt in ICU. 3. 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 Nutrition: renal diet when eating 6. Hypertension: expect to improve with UF 7.  Dispo: pending  Subjective:    HD yesterday with 2L off.  Minimal UOP.     Objective:   BP 137/71   Pulse 65   Temp 97.6 F (36.4 C) (Oral)   Resp 20   Ht '5\' 2"'$  (1.575 m)   Wt 89.2 kg   SpO2 98%   BMI 35.97 kg/m   Physical Exam: Gen: relaxed on vent CVS: RRR  Resp:coarse mech breath sounds bilaterally Abd: nondistended Ext: + flank and sacral edema ACCESS: L fem nontunneled HD cath  Labs: BMET Recent Labs  Lab 09/12/19 0403 09/12/19 0403 09/13/19 0341 09/14/19 0507 09/16/19 0243 09/16/19 0356 09/17/19 0320 09/17/19 0409 09/18/19 0314  NA 138   < > 138 137 141 138 141 143 138  K 3.3*   < > 3.8 3.2* 3.7 3.6 3.1* 3.2* 3.5  CL 102  --  101 97* 102  --   --  103 102  CO2 24  --  '27 27 23  '$ --   --  23 26  GLUCOSE 131*  --  169* 165* 219*  --   --  188* 317*  BUN 58*  --  45* 32* 55*  --   --  74* 48*  CREATININE 6.36*  --  4.61* 3.18* 4.90*  --   --  5.50* 3.04*  CALCIUM 7.9*  --  8.1* 8.3* 9.0  --   --  8.8* 8.4*  PHOS 6.5*  --  4.4 2.9 6.4*  --   --  6.7* 3.5   < > = values in this interval not displayed.   CBC Recent Labs  Lab 09/13/19 0341 09/13/19 0341 09/16/19 0243 09/16/19 0356 09/17/19 0320 09/17/19 0409  WBC 10.6*  --  10.3  --   --  9.7  HGB 8.2*   < > 8.7* 10.5* 10.2* 8.3*  HCT 25.1*   < > 26.8* 31.0* 30.0* 25.4*  MCV 101.6*  --  99.3  --   --  97.7  PLT 174  --  220  --   --  253   < > = values in this interval not displayed.      Medications:    . acetaminophen  325 mg Per NG tube BID  .  aspirin  81 mg Per NG tube q morning - 10a  . atorvastatin  20 mg Per NG tube q1800  . calcitRIOL  0.25 mcg Per Tube Q M,W,F  . carvedilol  25 mg Per Tube BID WC  . chlorhexidine gluconate (MEDLINE KIT)  15 mL Mouth Rinse BID  . Chlorhexidine Gluconate Cloth  6 each Topical Q0600  . cholecalciferol  4,000 Units Per NG tube q morning - 10a  . darbepoetin (ARANESP) injection - NON-DIALYSIS  150 mcg Subcutaneous Q Thu-1800  . docusate  100 mg Per Tube BID  . feeding supplement (PRO-STAT SUGAR FREE 64)  30 mL Per Tube TID  . feeding supplement (VITAL 1.5 CAL)  1,000 mL Per Tube Q24H  . fentaNYL (SUBLIMAZE)  injection  25 mcg Intravenous Once  . ferric citrate  420 mg Oral TID WC  . furosemide  80 mg Intravenous Q12H  . heparin injection (subcutaneous)  5,000 Units Subcutaneous Q8H  . hydrALAZINE  50 mg Per Tube Q8H  . influenza vaccine adjuvanted  0.5 mL Intramuscular Tomorrow-1000  . insulin aspart  0-9 Units Subcutaneous Q4H  . insulin glargine  16 Units Subcutaneous QHS  . levothyroxine  25 mcg Per NG tube Q0600  . mouth rinse  15 mL Mouth Rinse 10 times per day  . montelukast  10 mg Per Tube QHS  . multivitamin  15 mL Per Tube Daily  . neomycin-bacitracin-polymyxin  1 application Topical Daily  . pantoprazole sodium  40 mg Per Tube Daily  . predniSONE  10 mg Per Tube Q breakfast  . primidone  50 mg Per NG tube q morning - 10a  . pyridOXINE  200 mg Per NG tube BID  . risperiDONE  0.5 mg Per NG tube BID  . sodium chloride flush  3 mL Intravenous Q12H  . traZODone  50 mg Per NG tube Daily     Madelon Lips, MD 09/18/2019, 11:38 AM

## 2019-09-18 NOTE — Progress Notes (Signed)
SLP Cancellation Note  Patient Details Name: [name] MRN: [id number] DOB: 13-Jul-1951   Cancelled treatment:       Reason Eval/Treat Not Completed: Medical issues which prohibited therapy. Pt remains intubated. Please reorder SLP when ready for swallow evaluation.    Osie Bond., M.A. [place] Acute Rehabilitation Services Pager [phone] Office [phone]  09/18/2019, 7:07 AM

## 2019-09-18 NOTE — Progress Notes (Cosign Needed)
APP Student Note for Educational Purposes   [facility] Progress Note   Assessment/ Plan:   1. Acute hypoxic RF: Reintubated 1/31 for stridor.  Per PCCM 2. AKI: On HD, started 1/27. Next HD tomorrow. Labs today improved after dialysis treatment yesterday.  -Na: 143 --> 138 today  -K: 3.2 --> 3.5 today  -BUN: 74 --> 48 today  -Cr: 5.5 --> 3.04 today  -Phos: 6.7 --> 3.5 today 3. 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 Nutrition: renal diet when eating 6. Hypertension: expect to improve with UF  Subjective:    Seen in room with nursing at bedside changing urinary catheter. Nursing reports patient's agitation is much improved today.  Has started HD with good effect thus far. Reintubated for stridor on 1/31.    Objective:   BP 135/65   Pulse 65   Temp 98 F (36.7 C) (Oral)   Resp 20   Ht '5\' 2"'$  (1.575 m)   Wt 89.2 kg   SpO2 97%   BMI 35.97 kg/m   Physical Exam: Gen: on the vent, urinary catheter was changed by nursing during interview. Patient did not appear agitated today CVS: RRR, no rubs or murmurs  Resp: coarse breath sounds bilaterally 2/2 vent Abd: BS present, nontender to palpation  Ext: 2+ pitting pedal edema going up to flank and sacral edema ACCESS: L fem nontunneled HD cath without signs of infection  Labs: BMET Recent Labs  Lab 09/12/19 0403 09/12/19 0403 09/13/19 0341 09/14/19 0507 09/16/19 0243 09/16/19 0356 09/17/19 0320 09/17/19 0409 09/18/19 0314  NA 138   < > 138 137 141 138 141 143 138  K 3.3*   < > 3.8 3.2* 3.7 3.6 3.1* 3.2* 3.5  CL 102  --  101 97* 102  --   --  103 102  CO2 24  --  '27 27 23  '$ --   --  23 26  GLUCOSE 131*  --  169* 165* 219*  --   --  188* 317*  BUN 58*  --  45* 32* 55*  --   --  74* 48*  CREATININE 6.36*  --  4.61* 3.18* 4.90*  --   --  5.50* 3.04*  CALCIUM 7.9*  --  8.1* 8.3* 9.0  --   --  8.8* 8.4*  PHOS 6.5*  --  4.4 2.9 6.4*  --   --  6.7* 3.5   < >  = values in this interval not displayed.   CBC Recent Labs  Lab 09/13/19 0341 09/13/19 0341 09/16/19 0243 09/16/19 0356 09/17/19 0320 09/17/19 0409  WBC 10.6*  --  10.3  --   --  9.7  HGB 8.2*   < > 8.7* 10.5* 10.2* 8.3*  HCT 25.1*   < > 26.8* 31.0* 30.0* 25.4*  MCV 101.6*  --  99.3  --   --  97.7  PLT 174  --  220  --   --  253   < > = values in this interval not displayed.      Medications:    . acetaminophen  325 mg Per NG tube BID  . aspirin  81 mg Per NG tube q morning - 10a  . atorvastatin  20 mg Per NG tube q1800  . calcitRIOL  0.25 mcg Per Tube Q M,W,F  . carvedilol  25 mg Per Tube BID WC  . chlorhexidine gluconate (MEDLINE KIT)  15 mL  Mouth Rinse BID  . Chlorhexidine Gluconate Cloth  6 each Topical Q0600  . cholecalciferol  4,000 Units Per NG tube q morning - 10a  . darbepoetin (ARANESP) injection - NON-DIALYSIS  150 mcg Subcutaneous Q Thu-1800  . docusate  100 mg Per Tube BID  . feeding supplement (PRO-STAT SUGAR FREE 64)  30 mL Per Tube TID  . feeding supplement (VITAL 1.5 CAL)  1,000 mL Per Tube Q24H  . fentaNYL (SUBLIMAZE) injection  25 mcg Intravenous Once  . ferric citrate  420 mg Oral TID WC  . furosemide  80 mg Intravenous Q12H  . heparin injection (subcutaneous)  5,000 Units Subcutaneous Q8H  . hydrALAZINE  50 mg Per Tube Q8H  . influenza vaccine adjuvanted  0.5 mL Intramuscular Tomorrow-1000  . insulin aspart  0-9 Units Subcutaneous Q4H  . insulin glargine  16 Units Subcutaneous QHS  . levothyroxine  25 mcg Per NG tube Q0600  . mouth rinse  15 mL Mouth Rinse 10 times per day  . montelukast  10 mg Per Tube QHS  . multivitamin  15 mL Per Tube Daily  . neomycin-bacitracin-polymyxin  1 application Topical Daily  . pantoprazole sodium  40 mg Per Tube Daily  . predniSONE  10 mg Per Tube Q breakfast  . primidone  50 mg Per NG tube q morning - 10a  . pyridOXINE  200 mg Per NG tube BID  . risperiDONE  0.5 mg Per NG tube BID  . sodium chloride flush  3 mL  Intravenous Q12H  . traZODone  50 mg Per NG tube Daily     Harrell Gave, PA Student  09/18/2019, 9:41 AM

## 2019-09-19 DIAGNOSIS — R061 Stridor: Secondary | ICD-10-CM | POA: Diagnosis not present

## 2019-09-19 DIAGNOSIS — R0602 Shortness of breath: Secondary | ICD-10-CM | POA: Diagnosis not present

## 2019-09-19 DIAGNOSIS — G9341 Metabolic encephalopathy: Secondary | ICD-10-CM | POA: Diagnosis not present

## 2019-09-19 DIAGNOSIS — N179 Acute kidney failure, unspecified: Secondary | ICD-10-CM | POA: Diagnosis not present

## 2019-09-19 DIAGNOSIS — J810 Acute pulmonary edema: Secondary | ICD-10-CM | POA: Diagnosis not present

## 2019-09-19 DIAGNOSIS — E8779 Other fluid overload: Secondary | ICD-10-CM | POA: Diagnosis not present

## 2019-09-19 DIAGNOSIS — N186 End stage renal disease: Secondary | ICD-10-CM

## 2019-09-19 LAB — RENAL FUNCTION PANEL
Albumin: 1.9 g/dL — ABNORMAL LOW (ref 3.5–5.0)
Anion gap: 10 (ref 5–15)
BUN: 74 mg/dL — ABNORMAL HIGH (ref 8–23)
CO2: 26 mmol/L (ref 22–32)
Calcium: 8.5 mg/dL — ABNORMAL LOW (ref 8.9–10.3)
Chloride: 106 mmol/L (ref 98–111)
Creatinine, Ser: 3.85 mg/dL — ABNORMAL HIGH (ref 0.44–1.00)
GFR calc Af Amer: 13 mL/min — ABNORMAL LOW (ref >60)
GFR calc non Af Amer: 11 mL/min — ABNORMAL LOW (ref >60)
Glucose, Bld: 189 mg/dL — ABNORMAL HIGH (ref 70–99)
Phosphorus: 2.8 mg/dL (ref 2.5–4.6)
Potassium: 4 mmol/L (ref 3.5–5.1)
Sodium: 142 mmol/L (ref 135–145)

## 2019-09-19 LAB — GLUCOSE, CAPILLARY
Glucose-Capillary: 186 mg/dL — ABNORMAL HIGH (ref 70–99)
Glucose-Capillary: 186 mg/dL — ABNORMAL HIGH (ref 70–99)
Glucose-Capillary: 98 mg/dL (ref 70–99)
Glucose-Capillary: 254 mg/dL — ABNORMAL HIGH (ref 70–99)
Glucose-Capillary: 258 mg/dL — ABNORMAL HIGH (ref 70–99)
Glucose-Capillary: 203 mg/dL — ABNORMAL HIGH (ref 70–99)

## 2019-09-19 MED ORDER — HEPARIN SODIUM (PORCINE) 1000 UNIT/ML IJ SOLN
INTRAMUSCULAR | Status: AC
  Filled 2019-09-19: qty 8

## 2019-09-19 MED ORDER — DEXMEDETOMIDINE HCL IN NACL 400 MCG/100ML IV SOLN
0.0000 ug/kg/h | INTRAVENOUS | Status: DC
  Administered 2019-09-19: 0.8 ug/kg/h via INTRAVENOUS
  Administered 2019-09-20: 0.6 ug/kg/h via INTRAVENOUS
  Filled 2019-09-19 (×4): qty 100

## 2019-09-19 NOTE — Progress Notes (Signed)
PT DISCHARGE Note  Patient Details Name: Shaunna Vanbuskirk MRN: [id number] DOB: 12/13/1950   Cancelled Treatment:    Reason Eval/Treat Not Completed: Medical issues which prohibited therapy  Noted plans for tunneled HD catheter are on hold until pt is extubated. PT will sign-off. Please re-order PT when incr activity is appropriate.   Arby Barrette, PT Pager [phone]   Rexanne Mano 09/19/2019, 8:14 AM

## 2019-09-19 NOTE — Progress Notes (Signed)
OT Cancellation Note  Patient Details Name: Kacyn Fujii MRN: [id number] DOB: [birth year]   Cancelled Treatment:    Reason Eval/Treat Not Completed: Medical issues which prohibited therapy. Noted plans for tunneled HD catheter are on hold until pt is extubated. OT will sign off.  Please re-order when medically appropriate.   Jolaine Artist, OT Acute Rehabilitation Services Pager [phone] Office [place] 09/19/2019, 11:37 AM

## 2019-09-19 NOTE — Progress Notes (Signed)
[facility] Progress Note   Assessment/ Plan:   1. Acute hypoxic RF: Reintubated 1/31 for stridor.  Per PCCM 2. AKI: On HD, started 1/27.  HD 2/1. Next planned for HD 2/3.  Will need access- VVS has been consulted, appreciate assistance.  Plans on hold now while pt in ICU.  Will alteplase catheter afer rx d/t not running well.   3. 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 Nutrition: renal diet when eating 6. Hypertension: expect to improve with UF 7.  Dispo: pending  Subjective:    Still intubated, on HD today.  D/w PCCM--> will go for max UF to aid in extubation.   Objective:   BP (!) 150/70   Pulse 66   Temp 98.1 F (36.7 C) (Axillary)   Resp 18   Ht '5\' 2"'$  (1.575 m)   Wt 90.7 kg   SpO2 99%   BMI 36.57 kg/m   Physical Exam: Gen: agitated on vent CVS: RRR  Resp:coarse mech breath sounds bilaterally Abd: nondistended Ext: + flank and sacral edema ACCESS: L fem nontunneled HD cath  Labs: BMET Recent Labs  Lab 09/13/19 0341 09/13/19 0341 09/14/19 0507 09/16/19 0243 09/16/19 0356 09/17/19 0320 09/17/19 0409 09/18/19 0314 09/19/19 0404  NA 138   < > 137 141 138 141 143 138 142  K 3.8   < > 3.2* 3.7 3.6 3.1* 3.2* 3.5 4.0  CL 101  --  97* 102  --   --  103 102 106  CO2 27  --  27 23  --   --  '23 26 26  '$ GLUCOSE 169*  --  165* 219*  --   --  188* 317* 189*  BUN 45*  --  32* 55*  --   --  74* 48* 74*  CREATININE 4.61*  --  3.18* 4.90*  --   --  5.50* 3.04* 3.85*  CALCIUM 8.1*  --  8.3* 9.0  --   --  8.8* 8.4* 8.5*  PHOS 4.4  --  2.9 6.4*  --   --  6.7* 3.5 2.8   < > = values in this interval not displayed.   CBC Recent Labs  Lab 09/13/19 0341 09/13/19 0341 09/16/19 0243 09/16/19 0356 09/17/19 0320 09/17/19 0409  WBC 10.6*  --  10.3  --   --  9.7  HGB 8.2*   < > 8.7* 10.5* 10.2* 8.3*  HCT 25.1*   < > 26.8* 31.0* 30.0* 25.4*  MCV 101.6*  --  99.3  --   --  97.7  PLT 174  --  220  --   --  253   <  > = values in this interval not displayed.      Medications:    . acetaminophen  325 mg Per NG tube BID  . aspirin  81 mg Per NG tube q morning - 10a  . atorvastatin  20 mg Per NG tube q1800  . calcitRIOL  0.25 mcg Per Tube Q M,W,F  . carvedilol  25 mg Per Tube BID WC  . chlorhexidine gluconate (MEDLINE KIT)  15 mL Mouth Rinse BID  . Chlorhexidine Gluconate Cloth  6 each Topical Q0600  . cholecalciferol  4,000 Units Per NG tube q morning - 10a  . darbepoetin (ARANESP) injection - NON-DIALYSIS  150 mcg Subcutaneous Q Thu-1800  . docusate  100 mg Per Tube BID  . feeding supplement (PRO-STAT SUGAR FREE 64)  30 mL Per Tube TID  . feeding supplement (VITAL 1.5 CAL)  1,000 mL Per Tube Q24H  . fentaNYL (SUBLIMAZE) injection  25 mcg Intravenous Once  . ferric citrate  420 mg Oral TID WC  . furosemide  80 mg Intravenous Q12H  . heparin injection (subcutaneous)  5,000 Units Subcutaneous Q8H  . hydrALAZINE  50 mg Per Tube Q8H  . influenza vaccine adjuvanted  0.5 mL Intramuscular Tomorrow-1000  . insulin aspart  0-9 Units Subcutaneous Q4H  . insulin aspart  4 Units Subcutaneous Q4H  . insulin glargine  16 Units Subcutaneous QHS  . levothyroxine  25 mcg Per NG tube Q0600  . mouth rinse  15 mL Mouth Rinse 10 times per day  . montelukast  10 mg Per Tube QHS  . multivitamin  15 mL Per Tube Daily  . neomycin-bacitracin-polymyxin  1 application Topical Daily  . pantoprazole sodium  40 mg Per Tube Daily  . predniSONE  10 mg Per Tube Q breakfast  . primidone  50 mg Per NG tube q morning - 10a  . pyridOXINE  200 mg Per NG tube BID  . risperiDONE  0.5 mg Per NG tube BID  . sodium chloride flush  3 mL Intravenous Q12H  . traZODone  50 mg Per NG tube Daily     Madelon Lips, MD 09/19/2019, 10:37 AM

## 2019-09-19 NOTE — Social Work (Signed)
EDCSW called [name] at [facility] [phone] and was informed that Pt had no know living relatives save for a niece that Pt has not seen or heard from for 30+ years. When asked if there had been any discussion with Pt about any medical life saving procedures in the past, the Pt simply stated that, "[facility] will take care of her". [name] also informed CSW that [facility]PS has begun the process of gaining custodial rights of Pt in the past 2 weeks.  CSW also called [facility] in [place] as it was Pts place of residence before [facility]. CSW was informed that [facility] also had  no knowledge of any living relatives of Pt.  CSW called [facility] in an effort to ascertain more information about Pt. Left voicemail for [place].  CSW called [facility] supervisor Nathaniel Man for guidance. Mr. Rolena Infante stated that 2 physicians could make medical decisions in the case that no family members could be located.   CSW updated [facility]M staff of results of efforts.

## 2019-09-19 NOTE — Progress Notes (Cosign Needed)
APP Student Note for Educational Purposes   [facility] Progress Note   Assessment/ Plan:   1. Acute hypoxic RF: Reintubated 1/31 for stridor.  Per PCCM 2. AKI: On HD, started 1/27. Next HD today. Last HD was 2/1 (no HD treatment yesterday). Labs fluctuating, however Cr remains improved from days ago.   -Na: 143 --> 138 --> 142  -K: 3.2 --> 3.5 --> 4  -BUN: 74 --> 48 --> 74  -Cr: 5.5 --> 3.04 --> 3.85  -Phos: 6.7 --> 3.5 --> 2.8  -Albumin 1.9  -VAS vein mapping on 2/1  -Discharge Planning: see nephro social work 3. 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 Nutrition: renal diet when eating 6. Hypertension: expect to improve with UF  Subjective:    Seen in room with nursing at bedside giving meds/tube feeds. Nursing reports no acute events overnight.  patient's agitation worse today at bedside. Has started HD with good effect thus far. Reintubated for stridor on 1/31.    Objective:   BP (!) 165/83   Pulse 67   Temp 98.2 F (36.8 C) (Oral)   Resp 20   Ht '5\' 2"'$  (1.575 m)   Wt 90.7 kg   SpO2 99%   BMI 36.57 kg/m   Physical Exam: Gen: on the vent, patient with agitation during tube feeds/medication administration. HD treatment running during evaluation CVS: RRR, no rubs or murmurs appreciated Resp: coarse breath sounds anteriorly 2/2 mechanical vent Abd: BS normoactive, nontender  Ext: 2+ pitting pedal edema going up to flank and sacral edema, catheter site L femoral without signs of infection ACCESS: L fem nontunneled HD cath without signs of infection  Labs: BMET Recent Labs  Lab 09/13/19 0341 09/13/19 0341 09/14/19 0507 09/16/19 0243 09/16/19 0356 09/17/19 0320 09/17/19 0409 09/18/19 0314 09/19/19 0404  NA 138   < > 137 141 138 141 143 138 142  K 3.8   < > 3.2* 3.7 3.6 3.1* 3.2* 3.5 4.0  CL 101  --  97* 102  --   --  103 102 106  CO2 27  --  27 23  --   --  '23 26 26  '$ GLUCOSE 169*  --  165* 219*   --   --  188* 317* 189*  BUN 45*  --  32* 55*  --   --  74* 48* 74*  CREATININE 4.61*  --  3.18* 4.90*  --   --  5.50* 3.04* 3.85*  CALCIUM 8.1*  --  8.3* 9.0  --   --  8.8* 8.4* 8.5*  PHOS 4.4  --  2.9 6.4*  --   --  6.7* 3.5 2.8   < > = values in this interval not displayed.   CBC Recent Labs  Lab 09/13/19 0341 09/13/19 0341 09/16/19 0243 09/16/19 0356 09/17/19 0320 09/17/19 0409  WBC 10.6*  --  10.3  --   --  9.7  HGB 8.2*   < > 8.7* 10.5* 10.2* 8.3*  HCT 25.1*   < > 26.8* 31.0* 30.0* 25.4*  MCV 101.6*  --  99.3  --   --  97.7  PLT 174  --  220  --   --  253   < > = values in this interval not displayed.      Medications:    . acetaminophen  325 mg Per NG tube BID  . aspirin  81 mg Per NG tube  q morning - 10a  . atorvastatin  20 mg Per NG tube q1800  . calcitRIOL  0.25 mcg Per Tube Q M,W,F  . carvedilol  25 mg Per Tube BID WC  . chlorhexidine gluconate (MEDLINE KIT)  15 mL Mouth Rinse BID  . Chlorhexidine Gluconate Cloth  6 each Topical Q0600  . cholecalciferol  4,000 Units Per NG tube q morning - 10a  . darbepoetin (ARANESP) injection - NON-DIALYSIS  150 mcg Subcutaneous Q Thu-1800  . docusate  100 mg Per Tube BID  . feeding supplement (PRO-STAT SUGAR FREE 64)  30 mL Per Tube TID  . feeding supplement (VITAL 1.5 CAL)  1,000 mL Per Tube Q24H  . fentaNYL (SUBLIMAZE) injection  25 mcg Intravenous Once  . ferric citrate  420 mg Oral TID WC  . furosemide  80 mg Intravenous Q12H  . heparin      . heparin injection (subcutaneous)  5,000 Units Subcutaneous Q8H  . hydrALAZINE  50 mg Per Tube Q8H  . influenza vaccine adjuvanted  0.5 mL Intramuscular Tomorrow-1000  . insulin aspart  0-9 Units Subcutaneous Q4H  . insulin aspart  4 Units Subcutaneous Q4H  . insulin glargine  16 Units Subcutaneous QHS  . levothyroxine  25 mcg Per NG tube Q0600  . mouth rinse  15 mL Mouth Rinse 10 times per day  . montelukast  10 mg Per Tube QHS  . multivitamin  15 mL Per Tube Daily  .  neomycin-bacitracin-polymyxin  1 application Topical Daily  . pantoprazole sodium  40 mg Per Tube Daily  . predniSONE  10 mg Per Tube Q breakfast  . primidone  50 mg Per NG tube q morning - 10a  . pyridOXINE  200 mg Per NG tube BID  . risperiDONE  0.5 mg Per NG tube BID  . sodium chloride flush  3 mL Intravenous Q12H  . traZODone  50 mg Per NG tube Daily     Harrell Gave, PA Student  09/19/2019, 9:27 AM

## 2019-09-19 NOTE — Procedures (Signed)
Patient seen and examined on Hemodialysis. BP (!) 85/60   Pulse 66   Temp 98.1 F (36.7 C) (Axillary)   Resp 20   Ht 5\' 2"  (1.575 m)   Wt 90.7 kg   SpO2 100%   BMI 36.57 kg/m   QB 200 mL/ min via L nontunneled fem cath UF goal 4L  Tolerating treatment without complaints at this time.  Will alteplase catheter after rx   Madelon Lips MD [place]  pgr [phone] 10:39 AM

## 2019-09-19 NOTE — Progress Notes (Addendum)
PULMONARY / CRITICAL CARE MEDICINE   NAME:  [name], MRN:  [id number], DOB:  1950-11-27, LOS: 56 ADMISSION DATE:  09/06/2019, CONSULTATION DATE:  1/25 REFERRING MD:  [facility]  CHIEF COMPLAINT:  resp failure   BRIEF HISTORY:     23 yowf remote smoker carries dx of COPD/AB on pred ? How long plus just  saba admitted with progressive wt gain, leg swelling sob with w/u c/w anasarca / bilateral effusions  more sob 1/23 and required intubation / mech with CTa neg PE, positive for mild bilateral pleural effusions with small lung voumes and PCCM asked so see 1/25    HISTORY OF PRESENT ILLNESS   No direct hx availabe but from H/P:  69 y.o. female   HTN, DM (on trulicity), hyperlipidemia, diastolic heart failure, schizoaffective D/O with episodes of catatonia and sz d/o- on primidone/cymbalta and risperdone. She also seems to have CKD- crt in the low to mid 2's in early 2020- then a crt in the system from October 2020 that was 3, followed by [name] as her neurologist who does not believe the patient has frank seizures --Presented to the ED with worsening shortness of breath, worsening lower extremity edema and decreased urine output despite reported  compliance with diuretics at home -- In the ED chest x-rays consistent with CHF -Creatinine admit  up to 6.34 creatinine was 3.1 on 06/11/2019 and creatinine was 2.1 on 10/09/2018 SIGNIFICANT PAST MEDICAL HISTORY    . Anxiety   . Asthma   . Back pain   . Barrett esophagus   . CHF (congestive heart failure) ([place])   . CKD (chronic kidney disease) stage 3, GFR 30-59 ml/min   . COPD (chronic obstructive pulmonary disease) ([place])   . Depression   . Diastolic heart failure ([place])   . Essential hypertension   . Fibromyalgia   . GERD (gastroesophageal reflux disease)   . Gout   . History of pericarditis   . Hyperlipidemia   . Hypothyroid   . Major depressive disorder   . Memory loss   . Nephrolithiasis    2008  .  Obesity hypoventilation syndrome (H[facility])    CPAP  . Obstructive sleep apnea   . PE (pulmonary embolism)    2010  . Peripheral neuropathy   . PTSD (post-traumatic stress disorder)   . Rheumatoid arthritis ([place])   . Schizophrenia ([place])   . Secondary Parkinson disease ([place]) 10/19/2018  . Steroid dependence ([place])   . Type 2 diabetes mellitus ([place])   . Vitamin D deficiency    . APPENDECTOMY    . CHOLECYSTECTOMY    . COLONOSCOPY     benign polyps (12/2000), repeat colonoscopy performed in 2007  . ENDOMETRIAL BIOPSY     10/03 benign columnar mucosa (limited material)  . KIDNEY SURGERY       SIGNIFICANT EVENTS:  Respiratory  1/23 4pm resp distress on floor, rapid response > intubated and transferred to ICU Started HD 1/26  1/29 intubated for hypoxemic respiratory failure, subsequently extubated 1/31 reintubated for stridor.    STUDIES:   CTa  Chest 1/23 1. No CT evidence of acute pulmonary embolism. 2. Moderate severity left upper lobe and right lower lobe atelectasis and/or infiltrate with marked severity consolidation seen throughout the entire right lower lobe. 3. Moderate size bilateral pleural effusions, right greater than Left. CT Head 1/23 no acute cahnge CT head 1/26 (? L side neglect) > no acute changes   CULTURES:  Covid  1/21 neg MRSA  pcr  1/23 neg  ET 1/29 >>>  ANTIBIOTICS:  Levaquin 1/24 >>> 1/29 Zosyn 1/29 >>> Vanc 1/29 only   LINES/TUBES:  Oral ET  1/23 L Fem HD 1/25  ETT 1/29>>>1/30  CONSULTANTS:  Nephrology 1/21 PCCM 1/25  VVS 1/30  SUBJECTIVE/interim:  No overnight issues. Awake and following commands.  CONSTITUTIONAL: BP (!) 165/83   Pulse 67   Temp 98.2 F (36.8 C) (Oral)   Resp 20   Ht 5\' 2"  (1.575 m)   Wt 90.7 kg   SpO2 99%   BMI 36.57 kg/m   I/O last 3 completed shifts: In: 2089.6 [I.V.:701.6; Other:8; NG/GT:1380] Out: 1461 [Urine:1461]     Vent Mode: PSV;CPAP FiO2 (%):  [40 %] 40 % Set Rate:  [20  bmp] 20 bmp Vt Set:  [400 mL] 400 mL PEEP:  [5 cmH20] 5 cmH20 Pressure Support:  [5 cmH20-10 cmH20] 5 cmH20 Plateau Pressure:  [15 cmH20-17 cmH20] 15 cmH20  PHYSICAL EXAM: General:  Chronically ill appearing female, NAD, intubated, sedated, calm Neuro:  Alert, moving all ext to command HEENT: sclera anicteric. ETT to vent Cardiovascular: irregularly irregular, HR in 70s no M/R/G Lungs: Diminished diffusely, no wheezes Abdomen: Soft, NT, ND and +BS Skin: no rash/ skin breakdown  Ext: +edema  Neuro: follows commands.   I reviewed CXR myself, worsening bilateral pulmonary edema   Discussed with bedside RN and nephrology.   ASSESSMENT AND PLAN    1) Acute resp failure hypoxemic and hypercarbic present on admit  in setting of vol overload, acute renal failure and anasarca/pleural effusions made worse by low albumin  - third intubation on 1/31 this admission. If fails this extubation will almost certainly need trach.  - on prednisone 10 mg daily for RA.  - weaning ventilator settings to start PS trials. She does have an audible cuff leak today.  - Would not extubate until substantially net negative volume status likely contributed to her stridor.   2) Acute renal failure/ low albumin > bilateral pleural effusions. - HD per renal - vascular following for tunneled line placement - lasix per renal - will discuss ongoing need for this.   3) Acute metabolic encephalopathy secondary to hypercarbia, present on admit. - precedex, minimizing sedating agents.   4) HCAP - d/c abx  Received 8 days of abx (levaquin and merrem) for adequate treatment.   5) Atrial Fibrillation, paroxysmal - currently rate controlled and in NSR.  - apparently new during this admission after extubation trial.  - monitor  - will need to discuss long term AC prior to discharge if persists.  Other active issues: Anemia of chronic disease - secondary to renal failure   The patient is critically ill with  multiple organ systems failure and requires high complexity decision making for assessment and support, frequent evaluation and titration of therapies, application of advanced monitoring technologies and extensive interpretation of multiple databases.   Critical Care Time devoted to patient care services described in this note is 34 minutes. This time reflects time of care of this [place] . This critical care time does not reflect separately billable procedures or procedure time, teaching time or supervisory time of PA/NP/Med student/Med Resident etc but could involve care discussion time.  [facility] 09/19/2019 8:09 AM  Pager: [phone] After hours pager: [phone]   Best Practice / Goals of Care / Disposition.   DVT PROPHYLAXIS:  Sub Q hep SUP: PPI NUTRITION: tube feeds at  goal.  FOLEY: none, purewick.  GLUCOSE: less than 180 - added tube feed coverage yesterday MOBILITY: bed rest for now. GOALS OF CARE: currently full code FAMILY DISCUSSIONS: I reached out to Anell Barr who is administrator at the group home where she was living prior to this hospital stay.  Discussed that [name] has dialysis needs, and there is a very strong likelihood that she will need a tracheostomy.  Alyse Low says that the patient does not make her medical decisions and has no family.  There was a process in place the [facility] to obtain legal guardianship but she is not sure where that process is at right now.  We will consult social work to assist with paperwork for legal guardianship and follow-up. DISPOSITION remain ICU  LABS  Glucose Recent Labs  Lab 09/18/19 0718 09/18/19 1119 09/18/19 1521 09/18/19 1932 09/18/19 2342 09/19/19 0351  GLUCAP 264* 275* 276* 166* 215* 186*    BMET Recent Labs  Lab 09/17/19 0409 09/18/19 0314 09/19/19 0404  NA 143 138 142  K 3.2* 3.5 4.0  CL 103 102 106  CO2 23 26 26   BUN 74* 48* 74*   CREATININE 5.50* 3.04* 3.85*  GLUCOSE 188* 317* 189*    Liver Enzymes Recent Labs  Lab 09/17/19 0409 09/18/19 0314 09/19/19 0404  ALBUMIN 1.8* 1.9* 1.9*    Electrolytes Recent Labs  Lab 09/16/19 0243 09/16/19 0243 09/17/19 0409 09/18/19 0314 09/19/19 0404  CALCIUM 9.0   < > 8.8* 8.4* 8.5*  MG 2.2  --  2.4  --   --   PHOS 6.4*   < > 6.7* 3.5 2.8   < > = values in this interval not displayed.   CBC Recent Labs  Lab 09/13/19 0341 09/13/19 0341 09/16/19 0243 09/16/19 0243 09/16/19 0356 09/17/19 0320 09/17/19 0409  WBC 10.6*  --  10.3  --   --   --  9.7  HGB 8.2*   < > 8.7*   < > 10.5* 10.2* 8.3*  HCT 25.1*   < > 26.8*   < > 31.0* 30.0* 25.4*  PLT 174  --  220  --   --   --  253   < > = values in this interval not displayed.   ABG Recent Labs  Lab 09/16/19 0356 09/17/19 0320  PHART 7.393 7.425  PCO2ART 41.5 38.7  PO2ART 75.0* 135.0*

## 2019-09-20 DIAGNOSIS — R0602 Shortness of breath: Secondary | ICD-10-CM | POA: Diagnosis not present

## 2019-09-20 DIAGNOSIS — E1142 Type 2 diabetes mellitus with diabetic polyneuropathy: Secondary | ICD-10-CM

## 2019-09-20 DIAGNOSIS — E8779 Other fluid overload: Secondary | ICD-10-CM | POA: Diagnosis not present

## 2019-09-20 DIAGNOSIS — N179 Acute kidney failure, unspecified: Secondary | ICD-10-CM | POA: Diagnosis not present

## 2019-09-20 DIAGNOSIS — J9601 Acute respiratory failure with hypoxia: Secondary | ICD-10-CM | POA: Diagnosis not present

## 2019-09-20 DIAGNOSIS — N186 End stage renal disease: Secondary | ICD-10-CM | POA: Diagnosis not present

## 2019-09-20 DIAGNOSIS — J810 Acute pulmonary edema: Secondary | ICD-10-CM | POA: Diagnosis not present

## 2019-09-20 LAB — RENAL FUNCTION PANEL
Albumin: 2.1 g/dL — ABNORMAL LOW (ref 3.5–5.0)
Anion gap: 11 (ref 5–15)
BUN: 60 mg/dL — ABNORMAL HIGH (ref 8–23)
CO2: 26 mmol/L (ref 22–32)
Calcium: 8.7 mg/dL — ABNORMAL LOW (ref 8.9–10.3)
Chloride: 104 mmol/L (ref 98–111)
Creatinine, Ser: 3.3 mg/dL — ABNORMAL HIGH (ref 0.44–1.00)
GFR calc Af Amer: 16 mL/min — ABNORMAL LOW (ref >60)
GFR calc non Af Amer: 14 mL/min — ABNORMAL LOW (ref >60)
Glucose, Bld: 237 mg/dL — ABNORMAL HIGH (ref 70–99)
Phosphorus: 3 mg/dL (ref 2.5–4.6)
Potassium: 3.3 mmol/L — ABNORMAL LOW (ref 3.5–5.1)
Sodium: 141 mmol/L (ref 135–145)

## 2019-09-20 LAB — GLUCOSE, CAPILLARY
Glucose-Capillary: 197 mg/dL — ABNORMAL HIGH (ref 70–99)
Glucose-Capillary: 244 mg/dL — ABNORMAL HIGH (ref 70–99)
Glucose-Capillary: 231 mg/dL — ABNORMAL HIGH (ref 70–99)
Glucose-Capillary: 120 mg/dL — ABNORMAL HIGH (ref 70–99)
Glucose-Capillary: 125 mg/dL — ABNORMAL HIGH (ref 70–99)
Glucose-Capillary: 149 mg/dL — ABNORMAL HIGH (ref 70–99)

## 2019-09-20 MED ORDER — HYDROXYZINE HCL 25 MG PO TABS
25.0000 mg | ORAL_TABLET | Freq: Three times a day (TID) | ORAL | Status: DC | PRN
  Administered 2019-09-22 – 2019-10-01 (×8): 25 mg via ORAL
  Filled 2019-09-20 (×8): qty 1

## 2019-09-20 MED ORDER — PRO-STAT SUGAR FREE PO LIQD
30.0000 mL | Freq: Three times a day (TID) | ORAL | Status: DC
  Administered 2019-09-20: 21:00:00 30 mL via ORAL
  Filled 2019-09-20: qty 30

## 2019-09-20 MED ORDER — ACETAMINOPHEN 650 MG RE SUPP: 650.0000 mg | Freq: Four times a day (QID) | RECTAL | Status: DC | PRN

## 2019-09-20 MED ORDER — LEVOTHYROXINE SODIUM 25 MCG PO TABS
25.0000 ug | ORAL_TABLET | Freq: Every day | ORAL | Status: DC
  Administered 2019-09-21 – 2019-10-01 (×10): 25 ug via ORAL
  Filled 2019-09-20 (×8): qty 1

## 2019-09-20 MED ORDER — HYDROMORPHONE HCL 1 MG/ML IJ SOLN
0.5000 mg | Freq: Once | INTRAMUSCULAR | Status: AC
  Administered 2019-09-20: 0.5 mg via INTRAVENOUS
  Filled 2019-09-20: qty 0.5

## 2019-09-20 MED ORDER — PREDNISONE 10 MG PO TABS
10.0000 mg | ORAL_TABLET | Freq: Every day | ORAL | Status: DC
  Administered 2019-09-21 – 2019-10-01 (×10): 10 mg via ORAL
  Filled 2019-09-20 (×10): qty 1

## 2019-09-20 MED ORDER — ASPIRIN 81 MG PO CHEW
81.0000 mg | CHEWABLE_TABLET | Freq: Every morning | ORAL | Status: DC
  Administered 2019-09-21 – 2019-10-01 (×11): 81 mg via ORAL
  Filled 2019-09-20 (×11): qty 1

## 2019-09-20 MED ORDER — TRAZODONE HCL 50 MG PO TABS
50.0000 mg | ORAL_TABLET | Freq: Every day | ORAL | Status: DC
  Administered 2019-09-20 – 2019-09-30 (×11): 50 mg via ORAL
  Filled 2019-09-20 (×11): qty 1

## 2019-09-20 MED ORDER — VITAMIN D 25 MCG (1000 UNIT) PO TABS
4000.0000 [IU] | ORAL_TABLET | Freq: Every morning | ORAL | Status: DC
  Administered 2019-09-21 – 2019-10-01 (×11): 4000 [IU] via ORAL
  Filled 2019-09-20 (×11): qty 4

## 2019-09-20 MED ORDER — ORAL CARE MOUTH RINSE
15.0000 mL | Freq: Two times a day (BID) | OROMUCOSAL | Status: DC
  Administered 2019-09-20 – 2019-09-30 (×21): 15 mL via OROMUCOSAL

## 2019-09-20 MED ORDER — VITAMIN B-6 100 MG PO TABS
200.0000 mg | ORAL_TABLET | Freq: Two times a day (BID) | ORAL | Status: DC
  Administered 2019-09-20 – 2019-10-01 (×22): 200 mg via ORAL
  Filled 2019-09-20 (×23): qty 2

## 2019-09-20 MED ORDER — CALCITRIOL 1 MCG/ML PO SOLN
0.2500 ug | ORAL | Status: DC
  Administered 2019-09-21: 0.25 ug via ORAL
  Filled 2019-09-20 (×2): qty 0.25

## 2019-09-20 MED ORDER — ACETAMINOPHEN 325 MG PO TABS
325.0000 mg | ORAL_TABLET | Freq: Two times a day (BID) | ORAL | Status: DC
  Administered 2019-09-20: 325 mg via ORAL
  Filled 2019-09-20: qty 1

## 2019-09-20 MED ORDER — HYDRALAZINE HCL 50 MG PO TABS
50.0000 mg | ORAL_TABLET | Freq: Three times a day (TID) | ORAL | Status: DC
  Administered 2019-09-20 – 2019-09-21 (×2): 50 mg via ORAL
  Filled 2019-09-20 (×2): qty 1

## 2019-09-20 MED ORDER — RISPERIDONE 0.5 MG PO TABS
0.5000 mg | ORAL_TABLET | Freq: Two times a day (BID) | ORAL | Status: DC
  Administered 2019-09-20 – 2019-10-01 (×22): 0.5 mg via ORAL
  Filled 2019-09-20 (×23): qty 1

## 2019-09-20 MED ORDER — ACETAMINOPHEN 325 MG PO TABS
650.0000 mg | ORAL_TABLET | Freq: Four times a day (QID) | ORAL | Status: DC | PRN
  Administered 2019-09-21 – 2019-09-28 (×11): 650 mg via ORAL
  Filled 2019-09-20 (×12): qty 2

## 2019-09-20 MED ORDER — CARVEDILOL 25 MG PO TABS
25.0000 mg | ORAL_TABLET | Freq: Two times a day (BID) | ORAL | Status: DC
  Administered 2019-09-21 – 2019-10-01 (×19): 25 mg via ORAL
  Filled 2019-09-20 (×20): qty 1

## 2019-09-20 MED ORDER — HALOPERIDOL LACTATE 5 MG/ML IJ SOLN
5.0000 mg | Freq: Four times a day (QID) | INTRAMUSCULAR | Status: DC | PRN
  Administered 2019-09-25: 5 mg via INTRAVENOUS
  Filled 2019-09-20: qty 1

## 2019-09-20 MED ORDER — ATORVASTATIN CALCIUM 10 MG PO TABS
20.0000 mg | ORAL_TABLET | Freq: Every day | ORAL | Status: DC
  Administered 2019-09-21 – 2019-10-01 (×11): 20 mg via ORAL
  Filled 2019-09-20 (×11): qty 2

## 2019-09-20 MED ORDER — DOCUSATE SODIUM 50 MG/5ML PO LIQD
100.0000 mg | Freq: Two times a day (BID) | ORAL | Status: DC
  Administered 2019-09-20 – 2019-09-30 (×19): 100 mg via ORAL
  Filled 2019-09-20 (×22): qty 10

## 2019-09-20 MED ORDER — POTASSIUM CHLORIDE 20 MEQ/15ML (10%) PO SOLN
40.0000 meq | Freq: Once | ORAL | Status: AC
  Administered 2019-09-20: 40 meq
  Filled 2019-09-20: qty 30

## 2019-09-20 MED ORDER — PRIMIDONE 50 MG PO TABS
50.0000 mg | ORAL_TABLET | Freq: Every morning | ORAL | Status: DC
  Administered 2019-09-21 – 2019-10-01 (×11): 50 mg via ORAL
  Filled 2019-09-20 (×11): qty 1

## 2019-09-20 NOTE — Progress Notes (Signed)
[facility] Progress Note   Assessment/ Plan:   1. Acute hypoxic RF: Reintubated 1/31 for stridor.  Per PCCM, weaning on vent now 2. AKI: On HD, started 1/27.  HD 2/1. s/p HD 2/3.  Will need access- VVS has been consulted, appreciate assistance.  Plans on hold now while pt in ICU.  Has L fem nontunneled HD catheter which, if not extubated, will need to be converted. Would continue Lasix since pt is making urine- ? Recovery, would bladder scan q 8. 3. 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 Nutrition: renal diet when eating 6. Hypertension: expect to improve with UF 7.  Dispo: pending  Subjective:    Had urinary retention and had 1450 out from I/O cath today per RN staff.     Objective:   BP (!) 142/76   Pulse 67   Temp 98.3 F (36.8 C) (Axillary)   Resp (!) 25   Ht '5\' 2"'$  (1.575 m)   Wt 88 kg   SpO2 100%   BMI 35.48 kg/m   Physical Exam: Gen: alert and following commands CVS: RRR  Resp:coarse mech breath sounds bilaterally Abd: nondistended Ext: + flank and sacral edema, improved ACCESS: L fem nontunneled HD cath with dried blood on dressing  Labs: BMET Recent Labs  Lab 09/14/19 0507 09/14/19 0507 09/16/19 0243 09/16/19 0356 09/17/19 0320 09/17/19 0409 09/18/19 0314 09/19/19 0404 09/20/19 0231  NA 137   < > 141 138 141 143 138 142 141  K 3.2*   < > 3.7 3.6 3.1* 3.2* 3.5 4.0 3.3*  CL 97*  --  102  --   --  103 102 106 104  CO2 27  --  23  --   --  '23 26 26 26  '$ GLUCOSE 165*  --  219*  --   --  188* 317* 189* 237*  BUN 32*  --  55*  --   --  74* 48* 74* 60*  CREATININE 3.18*  --  4.90*  --   --  5.50* 3.04* 3.85* 3.30*  CALCIUM 8.3*  --  9.0  --   --  8.8* 8.4* 8.5* 8.7*  PHOS 2.9  --  6.4*  --   --  6.7* 3.5 2.8 3.0   < > = values in this interval not displayed.   CBC Recent Labs  Lab 09/16/19 0243 09/16/19 0356 09/17/19 0320 09/17/19 0409  WBC 10.3  --   --  9.7  HGB 8.7* 10.5* 10.2* 8.3*   HCT 26.8* 31.0* 30.0* 25.4*  MCV 99.3  --   --  97.7  PLT 220  --   --  253      Medications:    . acetaminophen  325 mg Per NG tube BID  . aspirin  81 mg Per NG tube q morning - 10a  . atorvastatin  20 mg Per NG tube q1800  . calcitRIOL  0.25 mcg Per Tube Q M,W,F  . carvedilol  25 mg Per Tube BID WC  . chlorhexidine gluconate (MEDLINE KIT)  15 mL Mouth Rinse BID  . Chlorhexidine Gluconate Cloth  6 each Topical Q0600  . cholecalciferol  4,000 Units Per NG tube q morning - 10a  . darbepoetin (ARANESP) injection - NON-DIALYSIS  150 mcg Subcutaneous Q Thu-1800  . docusate  100 mg Per Tube BID  . feeding supplement (PRO-STAT SUGAR FREE 64)  30 mL Per Tube TID  . feeding  supplement (VITAL 1.5 CAL)  1,000 mL Per Tube Q24H  . fentaNYL (SUBLIMAZE) injection  25 mcg Intravenous Once  . ferric citrate  420 mg Oral TID WC  . furosemide  80 mg Intravenous Q12H  . heparin injection (subcutaneous)  5,000 Units Subcutaneous Q8H  . hydrALAZINE  50 mg Per Tube Q8H  . influenza vaccine adjuvanted  0.5 mL Intramuscular Tomorrow-1000  . insulin aspart  0-9 Units Subcutaneous Q4H  . insulin aspart  4 Units Subcutaneous Q4H  . insulin glargine  16 Units Subcutaneous QHS  . levothyroxine  25 mcg Per NG tube Q0600  . mouth rinse  15 mL Mouth Rinse 10 times per day  . montelukast  10 mg Per Tube QHS  . multivitamin  15 mL Per Tube Daily  . neomycin-bacitracin-polymyxin  1 application Topical Daily  . pantoprazole sodium  40 mg Per Tube Daily  . predniSONE  10 mg Per Tube Q breakfast  . primidone  50 mg Per NG tube q morning - 10a  . pyridOXINE  200 mg Per NG tube BID  . risperiDONE  0.5 mg Per NG tube BID  . sodium chloride flush  3 mL Intravenous Q12H  . traZODone  50 mg Per NG tube Daily     Madelon Lips, MD 09/20/2019, 9:51 AM

## 2019-09-20 NOTE — Procedures (Signed)
Extubation Procedure Note  Patient Details:   Name: [name] DOB: February 08, 1951 MRN: [id number]   Airway Documentation:    Vent end date: 09/20/19 Vent end time: 1130   Evaluation  O2 sats: stable throughout Complications: No apparent complications Patient did tolerate procedure well. Bilateral Breath Sounds: Clear, Diminished   Yes   Pt extubated to NIV, 10/5, 40%.  No stridor noted.  RN @ bedside.  Donnetta Hail 09/20/2019, 11:36 AM

## 2019-09-20 NOTE — Progress Notes (Signed)
PULMONARY / CRITICAL CARE MEDICINE   NAME:  [name], MRN:  [id number], DOB:  1951/03/01, LOS: 62 ADMISSION DATE:  09/06/2019, CONSULTATION DATE:  1/25 REFERRING MD:  [facility]ad  CHIEF COMPLAINT:  resp failure   BRIEF HISTORY:     75 yowf remote smoker carries dx of COPD/AB on pred ? How long plus just  saba admitted with progressive wt gain, leg swelling sob with w/u c/w anasarca / bilateral effusions  more sob 1/23 and required intubation / mech with CTa neg PE, positive for mild bilateral pleural effusions with small lung voumes and PCCM asked so see 1/25    HISTORY OF PRESENT ILLNESS   No direct hx availabe but from H/P:  69 y.o. female   HTN, DM (on trulicity), hyperlipidemia, diastolic heart failure, schizoaffective D/O with episodes of catatonia and sz d/o- on primidone/cymbalta and risperdone. She also seems to have CKD- crt in the low to mid 2's in early 2020- then a crt in the system from October 2020 that was 3, followed by [name] as her neurologist who does not believe the patient has frank seizures --Presented to the ED with worsening shortness of breath, worsening lower extremity edema and decreased urine output despite reported  compliance with diuretics at home -- In the ED chest x-rays consistent with CHF -Creatinine admit  up to 6.34 creatinine was 3.1 on 06/11/2019 and creatinine was 2.1 on 10/09/2018 SIGNIFICANT PAST MEDICAL HISTORY    . Anxiety   . Asthma   . Back pain   . Barrett esophagus   . CHF (congestive heart failure) ([place])   . CKD (chronic kidney disease) stage 3, GFR 30-59 ml/min   . COPD (chronic obstructive pulmonary disease) ([place])   . Depression   . Diastolic heart failure ([place])   . Essential hypertension   . Fibromyalgia   . GERD (gastroesophageal reflux disease)   . Gout   . History of pericarditis   . Hyperlipidemia   . Hypothyroid   . Major depressive disorder   . Memory loss   . Nephrolithiasis    2008  .  Obesity hypoventilation syndrome (HCC)    CPAP  . Obstructive sleep apnea   . PE (pulmonary embolism)    2010  . Peripheral neuropathy   . PTSD (post-traumatic stress disorder)   . Rheumatoid arthritis ([place])   . Schizophrenia ([place])   . 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 Steroid dependence ([place])   . Type 2 diabetes mellitus ([place])   . Vitamin D deficiency    . APPENDECTOMY    . CHOLECYSTECTOMY    . COLONOSCOPY     benign polyps (12/2000), repeat colonoscopy performed in 2007  . ENDOMETRIAL BIOPSY     10/03 benign columnar mucosa (limited material)  . KIDNEY SURGERY       SIGNIFICANT EVENTS:  Respiratory  1/23 4pm resp distress on floor, rapid response > intubated and transferred to ICU Started HD 1/26  1/29 intubated for hypoxemic respiratory failure, subsequently extubated 1/31 reintubated for stridor.    STUDIES:   CTa  Chest 1/23 1. No CT evidence of acute pulmonary embolism. 2. Moderate severity left upper lobe and right lower lobe atelectasis and/or infiltrate with marked severity consolidation seen throughout the entire right lower lobe. 3. Moderate size bilateral pleural effusions, right greater than Left. CT Head 1/23 no acute cahnge CT head 1/26 (? L side neglect) > no acute changes   CULTURES:  Covid  1/21 neg MRSA  pcr  1/23 neg  ET 1/29 >>>  ANTIBIOTICS:  Levaquin 1/24 >>> 1/29 Zosyn 1/29 >>> Vanc 1/29 only   LINES/TUBES:  Oral ET  1/23 L Fem HD 1/25  ETT 1/29>>>1/30  CONSULTANTS:  Nephrology 1/21 PCCM 1/25  VVS 1/30  SUBJECTIVE/interim:  No overnight issues. Got HD yesterday  CONSTITUTIONAL: BP (!) 149/72   Pulse 68   Temp 98.3 F (36.8 C) (Axillary)   Resp (!) 23   Ht 5\' 2"  (1.575 m)   Wt 88 kg   SpO2 100%   BMI 35.48 kg/m   I/O last 3 completed shifts: In: 2070.5 [I.V.:810.5; NG/GT:1260] Out: [id number]; Other:2000]     Vent Mode: CPAP;PSV FiO2 (%):  [40 %] 40 % Set Rate:  [20 bmp]  20 bmp Vt Set:  [400 mL] 400 mL PEEP:  [5 cmH20] 5 cmH20 Pressure Support:  [8 cmH20] 8 cmH20 Plateau Pressure:  [13 cmH20-17 cmH20] 15 cmH20  PHYSICAL EXAM: General:  Chronically ill appearing female, NAD, intubated, sedated, calm Neuro:  Alert, moving all ext to command HEENT: sclera anicteric. ETT to vent Cardiovascular: irregularly irregular, HR in 70s no M/R/G Lungs: Diminished diffusely, no wheezes Abdomen: Soft, NT, ND and +BS Skin: no rash/ skin breakdown  Ext: +edema  Neuro: follows commands.   I reviewed CXR myself, worsening bilateral pulmonary edema   Discussed with bedside RN and nephrology.   ASSESSMENT AND PLAN    1) Acute resp failure hypoxemic and hypercarbic present on admit  in setting of vol overload, acute renal failure and anasarca/pleural effusions made worse by low albumin  - third intubation on 1/31 this admission. If fails this extubation will almost certainly need trach.  - on prednisone 10 mg daily for RA.  -has a cuff leak and is passing SBT today.  - given that she has no legal guardianship would really want to give her a trial of extubation again before committing her to a tracheostomy.   2) Acute renal failure/ low albumin > bilateral pleural effusions. - HD per renal - vascular following for tunneled line placement after extubation. - lasix per renal  3) Acute metabolic encephalopathy secondary to hypercarbia, present on admit. - precedex, minimizing sedating agents.  4) Atrial Fibrillation, paroxysmal - currently rate controlled and in NSR.  - apparently new during this admission after extubation trial.  - monitor  - will need to discuss long term AC prior to discharge if persists.  Other active issues: Anemia of chronic disease - secondary to renal failure   The patient is critically ill with multiple organ systems failure and requires high complexity decision making for assessment and support, frequent evaluation and titration of  therapies, application of advanced monitoring technologies and extensive interpretation of multiple databases.   Critical Care Time devoted to patient care services described in this note is 33 minutes. This time reflects time of care of this [place] . This critical care time does not reflect separately billable procedures or procedure time, teaching time or supervisory time of PA/NP/Med student/Med Resident etc but could involve care discussion time.  [facility] 09/20/2019 10:24 AM  Pager: [phone] After hours pager: [phone]   Best Practice / Goals of Care / Disposition.   DVT PROPHYLAXIS:  Sub Q hep SUP: PPI NUTRITION: tube feeds at goal.  FOLEY: none, purewick.  GLUCOSE: less than 180 - added tube feed coverage yesterday MOBILITY: bed rest for now. GOALS OF CARE:  currently full code FAMILY DISCUSSIONS: I reached out to Anell Barr who is administrator at the group home where she was living prior to this hospital stay.  Discussed that Tylenol has dialysis needs, and there is a very strong likelihood that she will need a tracheostomy.  Alyse Low says that the patient does not make her medical decisions and has no family.  There was a process in place the [facility] to obtain legal guardianship but she is not sure where that process is at right now.  We will consult social work to assist with paperwork for legal guardianship and follow-up. DISPOSITION remain ICU  LABS  Glucose Recent Labs  Lab 09/19/19 1214 09/19/19 1608 09/19/19 1929 09/19/19 2307 09/20/19 0307 09/20/19 0758  GLUCAP 98 254* 258* 203* 197* 244*    BMET Recent Labs  Lab 09/18/19 0314 09/19/19 0404 09/20/19 0231  NA 138 142 141  K 3.5 4.0 3.3*  CL 102 106 104  CO2 26 26 26   BUN 48* 74* 60*  CREATININE 3.04* 3.85* 3.30*  GLUCOSE 317* 189* 237*    Liver Enzymes Recent Labs  Lab 09/18/19 0314 09/19/19 0404 09/20/19 0231   ALBUMIN 1.9* 1.9* 2.1*    Electrolytes Recent Labs  Lab 09/16/19 0243 09/16/19 0243 09/17/19 0409 09/17/19 0409 09/18/19 0314 09/19/19 0404 09/20/19 0231  CALCIUM 9.0   < > 8.8*   < > 8.4* 8.5* 8.7*  MG 2.2  --  2.4  --   --   --   --   PHOS 6.4*   < > 6.7*   < > 3.5 2.8 3.0   < > = values in this interval not displayed.   CBC Recent Labs  Lab 09/16/19 0243 09/16/19 0243 09/16/19 0356 09/17/19 0320 09/17/19 0409  WBC 10.3  --   --   --  9.7  HGB 8.7*   < > 10.5* 10.2* 8.3*  HCT 26.8*   < > 31.0* 30.0* 25.4*  PLT 220  --   --   --  253   < > = values in this interval not displayed.   ABG Recent Labs  Lab 09/16/19 0356 09/17/19 0320  PHART 7.393 7.425  PCO2ART 41.5 38.7  PO2ART 75.0* 135.0*

## 2019-09-20 NOTE — Progress Notes (Signed)
Per report, patient can take pills with water great. Diet order changed to NPO, sips with meds. Attempted swallow test and she did great and swallowed without any trouble or coughing.   Called pharmacy to switch orders from per tube to oral.

## 2019-09-21 DIAGNOSIS — J810 Acute pulmonary edema: Secondary | ICD-10-CM | POA: Diagnosis not present

## 2019-09-21 DIAGNOSIS — R0602 Shortness of breath: Secondary | ICD-10-CM | POA: Diagnosis not present

## 2019-09-21 DIAGNOSIS — Z794 Long term (current) use of insulin: Secondary | ICD-10-CM

## 2019-09-21 DIAGNOSIS — I480 Paroxysmal atrial fibrillation: Secondary | ICD-10-CM | POA: Diagnosis not present

## 2019-09-21 DIAGNOSIS — E1165 Type 2 diabetes mellitus with hyperglycemia: Secondary | ICD-10-CM

## 2019-09-21 DIAGNOSIS — G9341 Metabolic encephalopathy: Secondary | ICD-10-CM | POA: Diagnosis not present

## 2019-09-21 DIAGNOSIS — E8779 Other fluid overload: Secondary | ICD-10-CM | POA: Diagnosis not present

## 2019-09-21 DIAGNOSIS — N179 Acute kidney failure, unspecified: Secondary | ICD-10-CM | POA: Diagnosis not present

## 2019-09-21 LAB — RENAL FUNCTION PANEL
Albumin: 2 g/dL — ABNORMAL LOW (ref 3.5–5.0)
Anion gap: 13 (ref 5–15)
BUN: 74 mg/dL — ABNORMAL HIGH (ref 8–23)
CO2: 26 mmol/L (ref 22–32)
Calcium: 8.8 mg/dL — ABNORMAL LOW (ref 8.9–10.3)
Chloride: 104 mmol/L (ref 98–111)
Creatinine, Ser: 3.86 mg/dL — ABNORMAL HIGH (ref 0.44–1.00)
GFR calc Af Amer: 13 mL/min — ABNORMAL LOW (ref >60)
GFR calc non Af Amer: 11 mL/min — ABNORMAL LOW (ref >60)
Glucose, Bld: 89 mg/dL (ref 70–99)
Phosphorus: 2.8 mg/dL (ref 2.5–4.6)
Potassium: 3.6 mmol/L (ref 3.5–5.1)
Sodium: 143 mmol/L (ref 135–145)

## 2019-09-21 LAB — CBC
HCT: 29.4 % — ABNORMAL LOW (ref 36.0–46.0)
Hemoglobin: 9.6 g/dL — ABNORMAL LOW (ref 12.0–15.0)
MCH: 32.2 pg (ref 26.0–34.0)
MCHC: 32.7 g/dL (ref 30.0–36.0)
MCV: 98.7 fL (ref 80.0–100.0)
Platelets: 261 10*3/uL (ref 150–400)
RBC: 2.98 MIL/uL — ABNORMAL LOW (ref 3.87–5.11)
RDW: 14.1 % (ref 11.5–15.5)
WBC: 10.7 10*3/uL — ABNORMAL HIGH (ref 4.0–10.5)
nRBC: 0 % (ref 0.0–0.2)

## 2019-09-21 LAB — GLUCOSE, CAPILLARY
Glucose-Capillary: 71 mg/dL (ref 70–99)
Glucose-Capillary: 107 mg/dL — ABNORMAL HIGH (ref 70–99)
Glucose-Capillary: 161 mg/dL — ABNORMAL HIGH (ref 70–99)
Glucose-Capillary: 237 mg/dL — ABNORMAL HIGH (ref 70–99)
Glucose-Capillary: 186 mg/dL — ABNORMAL HIGH (ref 70–99)

## 2019-09-21 MED ORDER — GABAPENTIN 300 MG PO CAPS
300.0000 mg | ORAL_CAPSULE | Freq: Every day | ORAL | Status: DC
  Administered 2019-09-21 – 2019-09-22 (×2): 300 mg via ORAL
  Filled 2019-09-21 (×2): qty 1

## 2019-09-21 MED ORDER — LABETALOL HCL 5 MG/ML IV SOLN
10.0000 mg | INTRAVENOUS | Status: DC | PRN
  Administered 2019-09-21: 10 mg via INTRAVENOUS
  Filled 2019-09-21: qty 4

## 2019-09-21 MED ORDER — INSULIN ASPART 100 UNIT/ML ~~LOC~~ SOLN
0.0000 [IU] | Freq: Three times a day (TID) | SUBCUTANEOUS | Status: DC
  Administered 2019-09-21: 3 [IU] via SUBCUTANEOUS
  Administered 2019-09-22: 5 [IU] via SUBCUTANEOUS
  Administered 2019-09-22 (×2): 1 [IU] via SUBCUTANEOUS
  Administered 2019-09-23: 3 [IU] via SUBCUTANEOUS
  Administered 2019-09-23: 7 [IU] via SUBCUTANEOUS
  Administered 2019-09-23: 2 [IU] via SUBCUTANEOUS
  Administered 2019-09-24: 7 [IU] via SUBCUTANEOUS
  Administered 2019-09-24: 5 [IU] via SUBCUTANEOUS
  Administered 2019-09-25: 3 [IU] via SUBCUTANEOUS
  Administered 2019-09-25: 2 [IU] via SUBCUTANEOUS
  Administered 2019-09-26: 1 [IU] via SUBCUTANEOUS
  Administered 2019-09-26: 3 [IU] via SUBCUTANEOUS
  Administered 2019-09-26: 1 [IU] via SUBCUTANEOUS
  Administered 2019-09-27: 3 [IU] via SUBCUTANEOUS
  Administered 2019-09-27: 2 [IU] via SUBCUTANEOUS
  Administered 2019-09-28 – 2019-09-29 (×3): 3 [IU] via SUBCUTANEOUS
  Administered 2019-09-30: 5 [IU] via SUBCUTANEOUS
  Administered 2019-09-30: 3 [IU] via SUBCUTANEOUS
  Administered 2019-10-01: 5 [IU] via SUBCUTANEOUS
  Administered 2019-10-01: 1 [IU] via SUBCUTANEOUS

## 2019-09-21 MED ORDER — APIXABAN 2.5 MG PO TABS
2.5000 mg | ORAL_TABLET | Freq: Two times a day (BID) | ORAL | Status: DC
  Administered 2019-09-21 – 2019-09-27 (×14): 2.5 mg via ORAL
  Filled 2019-09-21 (×15): qty 1

## 2019-09-21 MED ORDER — FUROSEMIDE 10 MG/ML IJ SOLN: 40.0000 mg | Freq: Every day | INTRAMUSCULAR | Status: DC

## 2019-09-21 MED ORDER — SODIUM CHLORIDE 0.9 % IV SOLN: 100.0000 mL | INTRAVENOUS | Status: DC | PRN

## 2019-09-21 MED ORDER — LIDOCAINE-PRILOCAINE 2.5-2.5 % EX CREA: 1.0000 "application " | TOPICAL_CREAM | CUTANEOUS | Status: DC | PRN

## 2019-09-21 MED ORDER — INSULIN GLARGINE 100 UNIT/ML ~~LOC~~ SOLN
8.0000 [IU] | Freq: Every day | SUBCUTANEOUS | Status: DC
  Administered 2019-09-21 – 2019-09-22 (×2): 8 [IU] via SUBCUTANEOUS
  Filled 2019-09-21 (×3): qty 0.08

## 2019-09-21 MED ORDER — ADULT MULTIVITAMIN LIQUID CH
15.0000 mL | Freq: Every day | ORAL | Status: DC
  Administered 2019-09-22 – 2019-10-01 (×9): 15 mL via ORAL
  Filled 2019-09-21 (×10): qty 15

## 2019-09-21 MED ORDER — HEPARIN SODIUM (PORCINE) 1000 UNIT/ML DIALYSIS: 1000.0000 [IU] | INTRAMUSCULAR | Status: DC | PRN

## 2019-09-21 MED ORDER — LIDOCAINE HCL (PF) 1 % IJ SOLN: 5.0000 mL | INTRAMUSCULAR | Status: DC | PRN

## 2019-09-21 MED ORDER — HYDRALAZINE HCL 50 MG PO TABS
75.0000 mg | ORAL_TABLET | Freq: Three times a day (TID) | ORAL | Status: DC
  Administered 2019-09-21 – 2019-09-22 (×3): 75 mg via ORAL
  Filled 2019-09-21 (×3): qty 1

## 2019-09-21 MED ORDER — MONTELUKAST SODIUM 10 MG PO TABS
10.0000 mg | ORAL_TABLET | Freq: Every day | ORAL | Status: DC
  Administered 2019-09-21 – 2019-09-30 (×10): 10 mg via ORAL
  Filled 2019-09-21 (×10): qty 1

## 2019-09-21 MED ORDER — ALTEPLASE 2 MG IJ SOLR: 2.0000 mg | Freq: Once | INTRAMUSCULAR | Status: DC | PRN

## 2019-09-21 MED ORDER — LACTATED RINGERS IV SOLN: INTRAVENOUS | Status: DC

## 2019-09-21 MED ORDER — PENTAFLUOROPROP-TETRAFLUOROETH EX AERO: 1.0000 "application " | INHALATION_SPRAY | CUTANEOUS | Status: DC | PRN

## 2019-09-21 MED ORDER — GABAPENTIN 300 MG PO CAPS: 300.0000 mg | ORAL_CAPSULE | Freq: Every day | ORAL | Status: DC

## 2019-09-21 NOTE — Progress Notes (Signed)
ANTICOAGULATION CONSULT NOTE - Initial Consult  Pharmacy Consult for apixaban Indication: atrial fibrillation  Allergies  Allergen Reactions  . Benztropine Mesylate Other (See Comments)    Alopecia and white spots on eyes  . Codeine Swelling, Palpitations and Other (See Comments)    Mouth Swelling and Tachycardia   . Diazepam Other (See Comments)    COMA  . Diphenhydramine Hcl Anxiety, Palpitations and Other (See Comments)    Tachycardia and anxiousness  . Penicillins Swelling and Other (See Comments)    REACTION: ulcers in mouth, swelling, throat swelling  Has patient had a PCN reaction causing immediate rash, facial/tongue/throat swelling, SOB or lightheadedness with hypotension: YES Has patient had a PCN reaction causing severe rash involving Mucus membranes or skin necrosis: Unknown Has patient had a PCN reaction that required hospitalization Unknown Has patient had a PCN reaction occurring within the last 10 years: unknown If all of the above answers are "NO", then may proceed with Cephalospori  . Sulfonamide Derivatives Rash    Blisters, also  . Thioridazine Hcl Swelling  . Lisinopril Cough    Patient Measurements: Height: 5\' 2"  (157.5 cm) Weight: 196 lb 3.4 oz (89 kg) IBW/kg (Calculated) : 50.1   Vital Signs: Temp: 97.9 F (36.6 C) (02/05 0700) Temp Source: Oral (02/05 0700) BP: 171/73 (02/05 0700) Pulse Rate: 78 (02/05 0700)  Labs: Recent Labs    09/19/19 0404 09/20/19 0231 09/21/19 0252  CREATININE 3.85* 3.30* 3.86*    Estimated Creatinine Clearance: 14.7 mL/min (A) (by C-G formula based on SCr of 3.86 mg/dL (H)).   Medical History: Past Medical History:  Diagnosis Date  . Anxiety   . Asthma   . Back pain   . Barrett esophagus   . CHF (congestive heart failure) ([place])   . CKD (chronic kidney disease) stage 3, GFR 30-59 ml/min   . COPD (chronic obstructive pulmonary disease) ([place])   . Depression   . Diastolic heart failure ([place])   . Essential  hypertension   . Fibromyalgia   . GERD (gastroesophageal reflux disease)   . Gout   . History of pericarditis   . Hyperlipidemia   . Hypothyroid   . Major depressive disorder   . Memory loss   . Nephrolithiasis    2008  . Obesity hypoventilation syndrome (HCC)    CPAP  . Obstructive sleep apnea   . PE (pulmonary embolism)    2010  . Peripheral neuropathy   . PTSD (post-traumatic stress disorder)   . Rheumatoid arthritis ([place])   . Schizophrenia ([place])   . 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 Steroid dependence ([place])   . Type 2 diabetes mellitus ([place])   . Vitamin D deficiency     Medications:  Aspirin 81mg  daily  Receives [facility] with dialysis every Thursday   Assessment: New onset a-fib this admission after extubation trial. Noted active a-fib on the monitor with a CHADsVASc of 5. Will start her on apixaban 2.5 mg given her renal function. She has been getting intermittent dialysis here while seeing if her renal function can recover. She does not have a permanent tunneled HD cath yet so if plan for long term dialysis will need that and will need to hold apixaban.   Goal of Therapy:  Monitor platelets by anticoagulation protocol: Yes   Plan:  Apixaban 2.5mg  BID Monitor CBC s/sx bleeding   Marliss Czar Winner Valeriano 09/21/2019,8:30 AM

## 2019-09-21 NOTE — Evaluation (Signed)
Clinical/Bedside Swallow Evaluation Patient Details  Name: [name] MRN: [id number] Date of Birth: Dec 20, 1950  Today's Date: 09/21/2019 Time: SLP Start Time (ACUTE ONLY): 4 SLP Stop Time (ACUTE ONLY): 1436 SLP Time Calculation (min) (ACUTE ONLY): 12 min  Past Medical History:  Past Medical History:  Diagnosis Date  . Anxiety   . Asthma   . Back pain   . Barrett esophagus   . CHF (congestive heart failure) ([place])   . CKD (chronic kidney disease) stage 3, GFR 30-59 ml/min   . COPD (chronic obstructive pulmonary disease) ([place])   . Depression   . Diastolic heart failure ([place])   . Essential hypertension   . Fibromyalgia   . GERD (gastroesophageal reflux disease)   . Gout   . History of pericarditis   . Hyperlipidemia   . Hypothyroid   . Major depressive disorder   . Memory loss   . Nephrolithiasis    2008  . Obesity hypoventilation syndrome (HCC)    CPAP  . Obstructive sleep apnea   . PE (pulmonary embolism)    2010  . Peripheral neuropathy   . PTSD (post-traumatic stress disorder)   . Rheumatoid arthritis ([place])   . Schizophrenia ([place])   . 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 Steroid dependence ([place])   . Type 2 diabetes mellitus ([place])   . Vitamin D deficiency    Past Surgical History:  Past Surgical History:  Procedure Laterality Date  . APPENDECTOMY    . CHOLECYSTECTOMY    . COLONOSCOPY     benign polyps (12/2000), repeat colonoscopy performed in 2007  . ENDOMETRIAL BIOPSY     10/03 benign columnar mucosa (limited material)  . INSERTION OF DIALYSIS CATHETER Right 09/14/2019   Procedure: ATTEMPTED INSERTION OF DIALYSIS CATHETER;  Surgeon: Virl Cagey, MD;  Location: [facility];  Service: General;  Laterality: Right;  . KIDNEY SURGERY     HPI:  53 with history of remote smoker, COPD, GERD, dysphagia, fibromyalgia, Barretts esophagus. admitted with progressive wt gain, leg swelling sob with w/u c/w anasarca / bilateral effusions. Placement of HD  catheter 1/25. Required intubation 1/23-1/30, reintubated 1/31-2/4. MBS 08/17/18 recommending Dys 3 texture, thin liquids with chin tuck. BSE 09/16/19 recommended continue NPO allowing ice chips and MBS planned 2/1 however pt was reintubated 1/31. CXR Moderate central vascular congestion, pulmonary edema and bilateral pleural effusions with overlying atelectasis, right greater than left.   Assessment / Plan / Recommendation Clinical Impression  Pt has a history of mild dysphagia from 08/2018 with recommendations for thin liquid with a chin tuck and Dys 3 texture. Pt reports she has not been performing chin tuck. Oral manipulation appears to have improved from recent bedside swallow assessment 1/30. Subjective observation was unremarkable for indications of airway compromise. Pt has has significant deconditioning, 2 intubations this admission and has a history of dysphagia therefore recommend MBS prior to initiating solids/liquids. Pt has been taking pills with water and can continue, in addition to ice chips with full supervision after oral care. Will plan for MBS tomorrow.  SLP Visit Diagnosis: Dysphagia, unspecified (R13.10)    Aspiration Risk  Mild aspiration risk;Moderate aspiration risk    Diet Recommendation NPO;NPO except meds;Ice chips PRN after oral care   Medication Administration: Whole meds with liquid    Other  Recommendations Oral Care Recommendations: Oral care BID   Follow up Recommendations        Frequency and Duration  Prognosis Prognosis for Safe Diet Advancement: Good      Swallow Study   General HPI: 54 with history of remote smoker, COPD, GERD, dysphagia, fibromyalgia, Barretts esophagus. admitted with progressive wt gain, leg swelling sob with w/u c/w anasarca / bilateral effusions. Placement of HD catheter 1/25. Required intubation 1/23-1/30, reintubated 1/31-2/4. MBS 08/17/18 recommending Dys 3 texture, thin liquids with chin tuck. BSE 09/16/19 recommended  continue NPO allowing ice chips and MBS planned 2/1 however pt was reintubated 1/31. CXR Moderate central vascular congestion, pulmonary edema and bilateral pleural effusions with overlying atelectasis, right greater than left. Type of Study: Bedside Swallow Evaluation Previous Swallow Assessment: See HPI Diet Prior to this Study: NPO(meds with water) Temperature Spikes Noted: No Respiratory Status: Room air History of Recent Intubation: Yes Length of Intubations (days): (7 days, reintubated for 5) Date extubated: (1/30, 2/4) Behavior/Cognition: Alert;Cooperative;Requires cueing Oral Cavity Assessment: Within Functional Limits Oral Care Completed by SLP: No Oral Cavity - Dentition: Poor condition(partial dentures at home ) Vision: Functional for self-feeding Self-Feeding Abilities: Able to feed self;Needs assist Patient Positioning: Upright in bed Baseline Vocal Quality: Normal Volitional Cough: Strong Volitional Swallow: Able to elicit    Oral/Motor/Sensory Function Overall Oral Motor/Sensory Function: Within functional limits   Ice Chips Ice chips: Not tested   Thin Liquid Thin Liquid: Within functional limits Presentation: Cup;Straw    Nectar Thick Nectar Thick Liquid: Not tested   Honey Thick Honey Thick Liquid: Not tested   Puree Puree: Within functional limits   Solid     Solid: Within functional limits      Houston Siren 09/21/2019,3:03 PM  Orbie Pyo Colvin Caroli.Ed Risk analyst [phone] Office [phone]

## 2019-09-21 NOTE — Progress Notes (Signed)
PULMONARY / CRITICAL CARE MEDICINE   NAME:  [name], MRN:  [id number], DOB:  1951-05-19, LOS: 74 ADMISSION DATE:  09/06/2019, CONSULTATION DATE:  1/25 REFERRING MD:  [facility]  CHIEF COMPLAINT:  resp failure   BRIEF HISTORY:     44 yowf remote smoker carries dx of COPD/AB on pred ? How long plus just  saba admitted with progressive wt gain, leg swelling sob with w/u c/w anasarca / bilateral effusions  more sob 1/23 and required intubation / mech with CTa neg PE, positive for mild bilateral pleural effusions with small lung voumes and PCCM asked so see 1/25    HISTORY OF PRESENT ILLNESS   No direct hx availabe but from H/P:  69 y.o. female   HTN, DM (on trulicity), hyperlipidemia, diastolic heart failure, schizoaffective D/O with episodes of catatonia and sz d/o- on primidone/cymbalta and risperdone. She also seems to have CKD- crt in the low to mid 2's in early 2020- then a crt in the system from October 2020 that was 3, followed by [name] as her neurologist who does not believe the patient has frank seizures --Presented to the ED with worsening shortness of breath, worsening lower extremity edema and decreased urine output despite reported  compliance with diuretics at home -- In the ED chest x-rays consistent with CHF -Creatinine admit  up to 6.34 creatinine was 3.1 on 06/11/2019 and creatinine was 2.1 on 10/09/2018 SIGNIFICANT PAST MEDICAL HISTORY    . Anxiety   . Asthma   . Back pain   . Barrett esophagus   . CHF (congestive heart failure) ([place])   . CKD (chronic kidney disease) stage 3, GFR 30-59 ml/min   . COPD (chronic obstructive pulmonary disease) ([place])   . Depression   . Diastolic heart failure ([place])   . Essential hypertension   . Fibromyalgia   . GERD (gastroesophageal reflux disease)   . Gout   . History of pericarditis   . Hyperlipidemia   . Hypothyroid   . Major depressive disorder   . Memory loss   . Nephrolithiasis    2008  .  Obesity hypoventilation syndrome (H[facility])    CPAP  . Obstructive sleep apnea   . PE (pulmonary embolism)    2010  . Peripheral neuropathy   . PTSD (post-traumatic stress disorder)   . Rheumatoid arthritis ([place])   . Schizophrenia ([place])   . 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 Steroid dependence ([place])   . Type 2 diabetes mellitus ([place])   . Vitamin D deficiency    . APPENDECTOMY    . CHOLECYSTECTOMY    . COLONOSCOPY     benign polyps (12/2000), repeat colonoscopy performed in 2007  . ENDOMETRIAL BIOPSY     10/03 benign columnar mucosa (limited material)  . KIDNEY SURGERY       SIGNIFICANT EVENTS:  Respiratory  1/23 4pm resp distress on floor, rapid response > intubated and transferred to ICU Started HD 1/26  1/29 intubated for hypoxemic respiratory failure, subsequently extubated 1/31 reintubated for stridor.  2/4 extubated without difficulty  STUDIES:   CTa  Chest 1/23 1. No CT evidence of acute pulmonary embolism. 2. Moderate severity left upper lobe and right lower lobe atelectasis and/or infiltrate with marked severity consolidation seen throughout the entire right lower lobe. 3. Moderate size bilateral pleural effusions, right greater than Left. CT Head 1/23 no acute cahnge CT head 1/26 (? L side neglect) > no acute changes   CULTURES:  Covid  1/21 neg MRSA pcr  1/23 neg  ET 1/29 >>>1/31 ETT 1/31 >>>2/4  ANTIBIOTICS:  Levaquin 1/24 >>> 1/29 Zosyn 1/29 >>> Vanc 1/29 only   LINES/TUBES:  Oral ET  1/23 L Fem HD 1/25  ETT 1/29>>>1/30  CONSULTANTS:  Nephrology 1/21 PCCM 1/25  VVS 1/30  SUBJECTIVE/interim:  Extubated yesterday. Doing well, asking for food, following commands. Having leg burning. Was retaining urine an required foley catheter placemetn.  CONSTITUTIONAL: BP (!) 171/73   Pulse 78   Temp 97.9 F (36.6 C) (Oral)   Resp (!) 24   Ht 5\' 2"  (1.575 m)   Wt 89 kg   SpO2 96%   BMI 35.89 kg/m   I/O last 3  completed shifts: In: 1670.8 [P.O.:120; I.V.:624.6; NG/GT:926.3] Out: 3450 [Urine:3450]     Vent Mode: Stand-by FiO2 (%):  [40 %] 40 % Set Rate:  [16 bmp] 16 bmp PEEP:  [5 cmH20] 5 cmH20  PHYSICAL EXAM: General:  Chronically ill appearing female, sitting up in bed, asking for food, no distress Neuro:  Alert, moving all ext to command HEENT: sclera anicteric Cardiovascular: irregularly irregular, HR in 70s no M/R/G Lungs: Diminished diffusely, no wheezes Abdomen: Soft, NT, ND and +BS Skin: no rash/ skin breakdown  Ext: +edema  Neuro: awake and alert, impulsive  Discussed with bedside RN and nephrology, pharmacy  ASSESSMENT AND PLAN    1) Acute resp failure hypoxemic and hypercarbic present on admit  in setting of vol overload, acute renal failure and anasarca/pleural effusions made worse by low albumin  - intubated three times this admission. Successfully extubated  2) Acute renal failure/ low albumin > bilateral pleural effusions. - HD per renal. Has had groin HD catheter non-tunneled in place for several days. Would favor line holiday if renal is ok with this vs moving to tunneled line with vascular asap.  - lasix per renal  3) Acute metabolic encephalopathy secondary to hypercarbia, present on admit. - improved. She appears to be close to her baseline now - patient lives in group home, has schizophrenia, and there is an application in place for guardianship  4) Atrial Fibrillation, paroxysmal - currently rate controlled - apparently new during this admission after extubation trial.  - monitor  - will start apixaban today given CHADS2VASC score of 5.   The patient is critically ill with multiple organ systems failure and requires high complexity decision making for assessment and support, frequent evaluation and titration of therapies, application of advanced monitoring technologies and extensive interpretation of multiple databases.   Critical Care Time devoted to  patient care services described in this note is 35 minutes. This time reflects time of care of this [place] . This critical care time does not reflect separately billable procedures or procedure time, teaching time or supervisory time of PA/NP/Med student/Med Resident etc but could involve care discussion time.  [facility] 09/21/2019 8:38 AM  Pager: [phone] After hours pager: [phone]   Best Practice / Goals of Care / Disposition.   DVT PROPHYLAXIS:  Sub Q hep. Will start apixaban today.  SUP: discontinued NUTRITION: SLP eval today - will advance as tolerated FOLEY: yes, required for urinary retention GLUCOSE: less than 180 - added tube feed coverage yesterday MOBILITY: OOB with PT/OT GOALS OF CARE: full code FAMILY DISCUSSIONS: group home admin updated on 2/4.  DISPOSITION: med surg with tele. Signed out to [facility]. They will assume care on 2/6.   LABS  Glucose Recent Labs  Lab 09/20/19 1101 09/20/19 1516 09/20/19 1916 09/20/19 2315 09/21/19 0311 09/21/19 0718  GLUCAP 231* 120* 125* 149* 71 107*    BMET Recent Labs  Lab 09/19/19 0404 09/20/19 0231 09/21/19 0252  NA 142 141 143  K 4.0 3.3* 3.6  CL 106 104 104  CO2 26 26 26   BUN 74* 60* 74*  CREATININE 3.85* 3.30* 3.86*  GLUCOSE 189* 237* 89    Liver Enzymes Recent Labs  Lab 09/19/19 0404 09/20/19 0231 09/21/19 0252  ALBUMIN 1.9* 2.1* 2.0*    Electrolytes Recent Labs  Lab 09/16/19 0243 09/16/19 0243 09/17/19 0409 09/18/19 0314 09/19/19 0404 09/20/19 0231 09/21/19 0252  CALCIUM 9.0   < > 8.8*   < > 8.5* 8.7* 8.8*  MG 2.2  --  2.4  --   --   --   --   PHOS 6.4*   < > 6.7*   < > 2.8 3.0 2.8   < > = values in this interval not displayed.   CBC Recent Labs  Lab 09/16/19 0243 09/16/19 0243 09/16/19 0356 09/17/19 0320 09/17/19 0409  WBC 10.3  --   --   --  9.7  HGB 8.7*   < > 10.5* 10.2* 8.3*  HCT 26.8*   < > 31.0* 30.0* 25.4*   PLT 220  --   --   --  253   < > = values in this interval not displayed.   ABG Recent Labs  Lab 09/16/19 0356 09/17/19 0320  PHART 7.393 7.425  PCO2ART 41.5 38.7  PO2ART 75.0* 135.0*

## 2019-09-21 NOTE — Progress Notes (Signed)
[name]PHROLOG[facility] PROGRESS NOTE  Assessment/ Plan: Pt is a 69 y.o. yo female with COPD, HTN, DM, HLD, CHF, schizoaffective disorder, CKD admitted with respiratory failure and AKI.  #AKI: Started HD on 1/27 at [facility] s/p HD on, 2/1, HD 2/3.  Robust urine output of 3450 cc.  She is now extubated and on room air.  I will hold off on dialysis today.  No need to place permanent access.  Watch for renal recovery.  I will lower the dose of diuretics as she is getting close to euvolemic.  Monitor BUN, creatinine, strict ins and out.  Keep HD catheter for now.  # Acute hypoxic RF: Reintubated 1/31 for stridor.  Extubated now. Per PCCM.  # Anemia: ESA 150 q 2week and iron sat 26 %.  Monitor hemoglobin.  # CKD-MBD: Calcium, phosphorus acceptable.  Discontinue Auryxia.  Monitor electrolytes.  # Hypertension: On carvedilol, Lasix and hydralazine.  Monitor blood pressure.  Avoid hypotensive episode.  #Dispo: pending  Subjective: Seen and examined in ICU.  Patient is extubated and currently on room air.  She is alert awake and comfortable.  Excellent urine output. Objective Vital signs in last 24 hours: Vitals:   09/21/19 0409 09/21/19 0500 09/21/19 0600 09/21/19 0700  BP:  (!) 169/70 (!) 177/74 (!) 171/73  Pulse:  77 81 78  Resp:  (!) 24 20 (!) 24  Temp: 98.9 F (37.2 C)   97.9 F (36.6 C)  TempSrc: Oral   Oral  SpO2:  97% 96% 96%  Weight:  89 kg    Height:       Weight change: 0.3 kg  Intake/Output Summary (Last 24 hours) at 09/21/2019 0756 Last data filed at 09/21/2019 0700 Gross per 24 hour  Intake 896.87 ml  Output 3450 ml  Net -2553.13 ml       Labs: Basic Metabolic Panel: Recent Labs  Lab 09/19/19 0404 09/20/19 0231 09/21/19 0252  NA 142 141 143  K 4.0 3.3* 3.6  CL 106 104 104  CO2 26 26 26   GLUCOSE 189* 237* 89  BUN 74* 60* 74*  CREATININE 3.85* 3.30* 3.86*  CALCIUM 8.5* 8.7* 8.8*  PHOS 2.8 3.0 2.8   Liver Function Tests: Recent Labs   Lab 09/19/19 0404 09/20/19 0231 09/21/19 0252  ALBUMIN 1.9* 2.1* 2.0*   No results for input(s): LIPASE, AMYLASE in the last 168 hours. No results for input(s): AMMONIA in the last 168 hours. CBC: Recent Labs  Lab 09/16/19 0243 09/16/19 0243 09/16/19 0356 09/17/19 0320 09/17/19 0409  WBC 10.3  --   --   --  9.7  HGB 8.7*   < > 10.5* 10.2* 8.3*  HCT 26.8*   < > 31.0* 30.0* 25.4*  MCV 99.3  --   --   --  97.7  PLT 220  --   --   --  253   < > = values in this interval not displayed.   Cardiac Enzymes: No results for input(s): CKTOTAL, CKMB, CKMBINDEX, TROPONINI in the last 168 hours. CBG: Recent Labs  Lab 09/20/19 1516 09/20/19 1916 09/20/19 2315 09/21/19 0311 09/21/19 0718  GLUCAP 120* 125* 149* 71 107*    Iron Studies: No results for input(s): IRON, TIBC, TRANSFERRIN, FERRITIN in the last 72 hours. Studies/Results: No results found.  Medications: Infusions: . sodium chloride    . sodium chloride    . sodium chloride    . sodium chloride 10 mL/hr at 09/21/19 0700  Scheduled Medications: . aspirin  81 mg Oral q morning - 10a  . atorvastatin  20 mg Oral q1800  . calcitRIOL  0.25 mcg Oral Q M,W,F  . carvedilol  25 mg Oral BID WC  . Chlorhexidine Gluconate Cloth  6 each Topical Q0600  . cholecalciferol  4,000 Units Oral q morning - 10a  . darbepoetin (ARANESP) injection - NON-DIALYSIS  150 mcg Subcutaneous Q Thu-1800  . docusate  100 mg Oral BID  . fentaNYL (SUBLIMAZE) injection  25 mcg Intravenous Once  . ferric citrate  420 mg Oral TID WC  . furosemide  80 mg Intravenous Q12H  . heparin injection (subcutaneous)  5,000 Units Subcutaneous Q8H  . hydrALAZINE  50 mg Oral Q8H  . influenza vaccine adjuvanted  0.5 mL Intramuscular Tomorrow-1000  . insulin aspart  0-9 Units Subcutaneous Q4H  . insulin aspart  4 Units Subcutaneous Q4H  . insulin glargine  16 Units Subcutaneous QHS  . levothyroxine  25 mcg Oral Q0600  . mouth rinse  15 mL Mouth Rinse BID  .  montelukast  10 mg Per Tube QHS  . multivitamin  15 mL Per Tube Daily  . neomycin-bacitracin-polymyxin  1 application Topical Daily  . predniSONE  10 mg Oral Q breakfast  . primidone  50 mg Oral q morning - 10a  . pyridOXINE  200 mg Oral BID  . risperiDONE  0.5 mg Oral BID  . sodium chloride flush  3 mL Intravenous Q12H  . traZODone  50 mg Oral Daily    have reviewed scheduled and prn medications.  Physical Exam: General:NAD, comfortable Heart:RRR, s1s2 nl Lungs:clear b/l, no crackle Abdomen:soft, Non-tender, non-distended Extremities:No edema Dialysis Access: Groin temporary catheter.  [name] [name] [name]

## 2019-09-21 NOTE — Plan of Care (Signed)
  Problem: Nutrition: Goal: Adequate nutrition will be maintained Outcome: Progressing   Problem: Coping: Goal: Level of anxiety will decrease Outcome: Progressing   

## 2019-09-21 NOTE — Progress Notes (Signed)
Renal Navigator reviewed notes and provided update to [facility], who is following patient as a new referral.  Navigator notes that Nephrologist is following for recovery and also that she will need her temp cath converted to tunneled cath if she requires ongoing HD in outpatient clinic. She has been accepted for OP HD on MWF schedule at [facility]. Navigator will follow up on Monday to see how patient is doing and informed Admissions Coordinator that patient will NOT start in the clinic this Monday, 2/8.  Alphonzo Cruise, [place] Renal Navigator [phone]

## 2019-09-21 NOTE — Evaluation (Signed)
Physical Therapy Evaluation Patient Details Name: [name] MRN: [id number] DOB: May 09, 1951 Today's Date: 09/21/2019   History of Present Illness  69 y.o. female   HTN, DM (on trulicity), hyperlipidemia, diastolic heart failure,  schizoaffective D/O with episodes of catatonia and sz d/o, COPD, CKD admitted with progressive wt gain, leg swelling sob with w/u c/w anasarca / bilateral effusions  more sob 1/23 and required intubation / mech with CTa neg PE, positive for mild bilateral pleural effusions with small lung voumes.  Extubated 1/29, then re-intubated 1/31 due to stridor, extubated 2/4.  Clinical Impression  Patient presents with decreased mobility due to weakness, decreased balance, decreased activity tolerance and she will benefit from skilled PT in the acute setting to allow return to group home, but may need STSNF prior to d/c depending on level of assist available.     Follow Up Recommendations SNF;Supervision/Assistance - 24 hour    Equipment Recommendations  Other (comment)(TBA)    Recommendations for Other Services       Precautions / Restrictions Precautions Precautions: Fall Restrictions Weight Bearing Restrictions: No Other Position/Activity Restrictions: L femoral HD catheter (non-tunneled) but Dr. Carolin Sicks approved OOB to chair with caution to watch catheter      Mobility  Bed Mobility Overal bed mobility: Needs Assistance Bed Mobility: Supine to Sit     Supine to sit: HOB elevated;Mod assist;+2 for safety/equipment     General bed mobility comments: assist for scooting hips and lifting trunk  Transfers Overall transfer level: Needs assistance Equipment used: Rolling walker (2 wheeled) Transfers: Sit to/from [facility] Sit to Stand: Mod assist;+2 physical assistance Stand pivot transfers: Mod assist;+2 safety/equipment       General transfer comment: up from EOB x 3 due to soiled with BM and stood to clean x 2, then stepping to  chair with RW and mod A for balance, safety and lowering help  Ambulation/Gait                Stairs            Wheelchair Mobility    Modified Rankin (Stroke Patients Only)       Balance Overall balance assessment: Needs assistance Sitting-balance support: Feet supported Sitting balance-Leahy Scale: Poor Sitting balance - Comments: initially R lateral lean, assisted to reposition hips and feet to allow sitting with close S Postural control: Right lateral lean Standing balance support: Bilateral upper extremity supported Standing balance-Leahy Scale: Poor Standing balance comment: UE support and assist for balance for hygiene                             Pertinent Vitals/Pain Pain Assessment: Faces Faces Pain Scale: Hurts little more Pain Location: L fem cath site with moving back pannus to inspect, no c/o with mobility Pain Descriptors / Indicators: Grimacing;Discomfort Pain Intervention(s): Monitored during session;Repositioned    Home Living Family/patient expects to be discharged to:: Group home                      Prior Function Level of Independence: Needs assistance   Gait / Transfers Assistance Needed: reports was using w/c then started using walker and was able to toilet and dress herself at walker level           Hand Dominance        Extremity/Trunk Assessment   Upper Extremity Assessment Upper Extremity Assessment: Generalized weakness    Lower Extremity Assessment  Lower Extremity Assessment: Generalized weakness       Communication   Communication: No difficulties  Cognition   Behavior During Therapy: WFL for tasks assessed/performed Overall Cognitive Status: No family/caregiver present to determine baseline cognitive functioning                                 General Comments: oriented to day but stated January, not aware of her LOS in hospital      General Comments      Exercises      Assessment/Plan    PT Assessment Patient needs continued PT services  PT Problem List Decreased strength;Decreased mobility;Decreased activity tolerance;Decreased balance;Decreased knowledge of use of DME;Decreased knowledge of precautions;Decreased safety awareness       PT Treatment Interventions DME instruction;Therapeutic activities;Balance training;Gait training;Functional mobility training;Therapeutic exercise;Patient/family education    PT Goals (Current goals can be found in the Care Plan section)  Acute Rehab PT Goals Patient Stated Goal: to get up to chair PT Goal Formulation: With patient Time For Goal Achievement: 10/05/19 Potential to Achieve Goals: Fair    Frequency Min 3X/week   Barriers to discharge        Co-evaluation               AM-PAC PT "6 Clicks" Mobility  Outcome Measure Help needed turning from your back to your side while in a flat bed without using bedrails?: A Little Help needed moving from lying on your back to sitting on the side of a flat bed without using bedrails?: A Lot Help needed moving to and from a bed to a chair (including a wheelchair)?: A Lot Help needed standing up from a chair using your arms (e.g., wheelchair or bedside chair)?: A Lot Help needed to walk in hospital room?: Total Help needed climbing 3-5 steps with a railing? : Total 6 Click Score: 11    End of Session   Activity Tolerance: Patient limited by fatigue Patient left: in chair;with call bell/phone within reach;with chair alarm set Nurse Communication: Mobility status PT Visit Diagnosis: Other abnormalities of gait and mobility (R26.89);Muscle weakness (generalized) (M62.81)    Time: [id number] PT Time Calculation (min) (ACUTE ONLY): 26 min   Charges:   PT Evaluation $PT Eval Moderate Complexity: 1 Mod PT Treatments $Therapeutic Activity: 8-22 mins        Magda Kiel, [place] [facility] [phone] 09/21/2019   Reginia Naas 09/21/2019, 5:15 PM

## 2019-09-22 ENCOUNTER — Inpatient Hospital Stay (HOSPITAL_COMMUNITY): Payer: Medicare Other

## 2019-09-22 DIAGNOSIS — N189 Chronic kidney disease, unspecified: Secondary | ICD-10-CM | POA: Diagnosis not present

## 2019-09-22 DIAGNOSIS — N179 Acute kidney failure, unspecified: Secondary | ICD-10-CM | POA: Diagnosis not present

## 2019-09-22 DIAGNOSIS — E8779 Other fluid overload: Secondary | ICD-10-CM | POA: Diagnosis not present

## 2019-09-22 DIAGNOSIS — R0602 Shortness of breath: Secondary | ICD-10-CM | POA: Diagnosis not present

## 2019-09-22 LAB — RENAL FUNCTION PANEL
Albumin: 2.1 g/dL — ABNORMAL LOW (ref 3.5–5.0)
Anion gap: 9 (ref 5–15)
BUN: 73 mg/dL — ABNORMAL HIGH (ref 8–23)
CO2: 27 mmol/L (ref 22–32)
Calcium: 8.8 mg/dL — ABNORMAL LOW (ref 8.9–10.3)
Chloride: 106 mmol/L (ref 98–111)
Creatinine, Ser: 4.59 mg/dL — ABNORMAL HIGH (ref 0.44–1.00)
GFR calc Af Amer: 11 mL/min — ABNORMAL LOW (ref >60)
GFR calc non Af Amer: 9 mL/min — ABNORMAL LOW (ref >60)
Glucose, Bld: 156 mg/dL — ABNORMAL HIGH (ref 70–99)
Phosphorus: 4.3 mg/dL (ref 2.5–4.6)
Potassium: 3.5 mmol/L (ref 3.5–5.1)
Sodium: 142 mmol/L (ref 135–145)

## 2019-09-22 LAB — GLUCOSE, CAPILLARY
Glucose-Capillary: 140 mg/dL — ABNORMAL HIGH (ref 70–99)
Glucose-Capillary: 149 mg/dL — ABNORMAL HIGH (ref 70–99)
Glucose-Capillary: 300 mg/dL — ABNORMAL HIGH (ref 70–99)
Glucose-Capillary: 301 mg/dL — ABNORMAL HIGH (ref 70–99)

## 2019-09-22 MED ORDER — HYDRALAZINE HCL 50 MG PO TABS
100.0000 mg | ORAL_TABLET | Freq: Three times a day (TID) | ORAL | Status: DC
  Administered 2019-09-22 – 2019-10-01 (×24): 100 mg via ORAL
  Filled 2019-09-22 (×24): qty 2

## 2019-09-22 MED ORDER — POTASSIUM CHLORIDE CRYS ER 20 MEQ PO TBCR: 40.0000 meq | EXTENDED_RELEASE_TABLET | Freq: Once | ORAL | Status: DC

## 2019-09-22 MED ORDER — POTASSIUM CHLORIDE 20 MEQ/15ML (10%) PO SOLN
40.0000 meq | Freq: Once | ORAL | Status: AC
  Administered 2019-09-22: 40 meq via ORAL
  Filled 2019-09-22: qty 30

## 2019-09-22 MED ORDER — FUROSEMIDE 40 MG PO TABS
40.0000 mg | ORAL_TABLET | Freq: Every day | ORAL | Status: DC
  Administered 2019-09-22 – 2019-09-25 (×4): 40 mg via ORAL
  Filled 2019-09-22 (×4): qty 1

## 2019-09-22 NOTE — Evaluation (Signed)
Occupational Therapy Evaluation Patient Details Name: Nekoda Lehane MRN: [id number] DOB: 01-09-1951 Today's Date: 09/22/2019    History of Present Illness 69 y.o. female   HTN, DM (on trulicity), hyperlipidemia, diastolic heart failure,  schizoaffective D/O with episodes of catatonia and sz d/o, COPD, CKD admitted with progressive wt gain, leg swelling sob with w/u c/w anasarca / bilateral effusions  more sob 1/23 and required intubation / mech with CTa neg PE, positive for mild bilateral pleural effusions with small lung voumes.  Extubated 1/29, then re-intubated 1/31 due to stridor, extubated 2/4.   Clinical Impression   Pt admitted with see above. Pt currently with functional limitations due to the deficits listed below (see OT Problem List). Pt was able to go from supine to sitting with mod to min assist, sit to stand with min assist with increase time and trials and ambulated with FW to chair with min guard. Per pt they normally live at ALF setting where they have assist with all ADLS and limited ambulation due to community restrictions. Pt will benefit from skilled OT to increase their safety and independence with ADL and functional mobility for ADL to facilitate discharge to venue listed below.       Follow Up Recommendations  SNF;Supervision/Assistance - 24 hour    Equipment Recommendations       Recommendations for Other Services       Precautions / Restrictions Precautions Precautions: Fall Restrictions Weight Bearing Restrictions: No Other Position/Activity Restrictions: L femoral HD catheter (non-tunneled) but Dr. Carolin Sicks approved OOB to chair with caution to watch catheter      Mobility Bed Mobility Overal bed mobility: Needs Assistance Bed Mobility: Supine to Sit     Supine to sit: HOB elevated;Mod assist        Transfers Overall transfer level: Needs assistance Equipment used: Rolling walker (2 wheeled) Transfers: Sit to/from Stand Sit to Stand: Min  assist;From elevated surface         General transfer comment: pt requires increase time    Balance Overall balance assessment: Needs assistance Sitting-balance support: Feet supported Sitting balance-Leahy Scale: Poor Sitting balance - Comments: leaning but able to support with BLE and increase in control Postural control: Right lateral lean Standing balance support: Bilateral upper extremity supported Standing balance-Leahy Scale: Poor Standing balance comment: UE support and assist for balance for hygiene                           ADL either performed or assessed with clinical judgement   ADL Overall ADL's : Needs assistance/impaired Eating/Feeding: Independent;Sitting   Grooming: Wash/dry hands;Wash/dry face;Moderate assistance;Standing   Upper Body Bathing: Sitting;Minimal assistance   Lower Body Bathing: Maximal assistance;Sit to/from stand   Upper Body Dressing : Minimal assistance;Sitting   Lower Body Dressing: Maximal assistance;Sit to/from stand   Toilet Transfer: Minimal Office manager and Hygiene: Maximal assistance;Sit to/from stand   Tub/ Banker: Moderate assistance;Shower seat   Functional mobility during ADLs: Min guard General ADL Comments: per pt tired      Vision Patient Visual Report: No change from baseline Vision Assessment?: No apparent visual deficits     Perception Perception Perception Tested?: No   Praxis Praxis Praxis tested?: Not tested    Pertinent Vitals/Pain Faces Pain Scale: Hurts little more Pain Location: L fem cath site with moving back pannus to inspect, no c/o with mobility Pain Descriptors / Indicators: Grimacing;Discomfort Pain Intervention(s): Monitored during session  Hand Dominance Right   Extremity/Trunk Assessment Upper Extremity Assessment Upper Extremity Assessment: Generalized weakness   Lower Extremity Assessment Lower Extremity  Assessment: Defer to PT evaluation       Communication Communication Communication: No difficulties   Cognition Arousal/Alertness: Awake/alert Behavior During Therapy: WFL for tasks assessed/performed Overall Cognitive Status: No family/caregiver present to determine baseline cognitive functioning                                     General Comments       Exercises     Shoulder Instructions      Home Living Family/patient expects to be discharged to:: Group home                                        Prior Functioning/Environment Level of Independence: Needs assistance  Gait / Transfers Assistance Needed: reports was using w/c then started using walker and was able to toilet and dress herself at walker level ADL's / Homemaking Assistance Needed: assisted by ALF staff            OT Problem List: Decreased strength;Decreased activity tolerance;Decreased knowledge of use of DME or AE;Decreased safety awareness;Pain      OT Treatment/Interventions: Self-care/ADL training;Therapeutic exercise;Energy conservation;Balance training;Patient/family education    OT Goals(Current goals can be found in the care plan section) Acute Rehab OT Goals Patient Stated Goal: to be stronger OT Goal Formulation: With patient Time For Goal Achievement: 10/13/19 Potential to Achieve Goals: Good ADL Goals Pt Will Perform Upper Body Dressing: with modified independence;sitting Pt Will Perform Lower Body Dressing: with supervision;sit to/from stand Pt Will Transfer to Toilet: with supervision;grab bars;regular height toilet;ambulating  OT Frequency: Min 2X/week   Barriers to D/C:            Co-evaluation              AM-PAC OT "6 Clicks" Daily Activity     Outcome Measure Help from another person eating meals?: A Little Help from another person taking care of personal grooming?: A Little Help from another person toileting, which includes using  toliet, bedpan, or urinal?: A Little Help from another person bathing (including washing, rinsing, drying)?: A Lot Help from another person to put on and taking off regular upper body clothing?: A Little Help from another person to put on and taking off regular lower body clothing?: A Lot 6 Click Score: 16   End of Session Equipment Utilized During Treatment: Gait belt;Rolling walker Nurse Communication: Other (comment)(cath bag)  Activity Tolerance: Patient limited by fatigue Patient left: in chair;with chair alarm set;with call bell/phone within reach  OT Visit Diagnosis: Unsteadiness on feet (R26.81);Repeated falls (R29.6);Pain Pain - Right/Left: Left Pain - part of body: Leg                Time: 1222-1250 OT Time Calculation (min): 28 min Charges:  OT General Charges $OT Visit: 1 Visit OT Evaluation $OT Eval Low Complexity: 1 Low OT Treatments $Self Care/Home Management : 8-22 mins  Joeseph Amor OTR/L  Acute Rehab Services  [phone] office number [phone] pager number   Joeseph Amor 09/22/2019, 1:00 PM

## 2019-09-22 NOTE — Progress Notes (Signed)
[facility]                                                                              Patient Demographics  [name], is a 69 y.o. female, DOB - [birth year], [id number]  Admit date - 09/06/2019   Admitting Physician Candee Furbish, MD  Outpatient Primary MD for the patient is Glenda Chroman, MD  Outpatient specialists:   LOS - 16  days    Chief Complaint  Patient presents with  . Shortness of Breath       Brief summary  69 year old [place] of medical history significant for but not limited to diastolic CHF, chronic kidney disease and COPD who presented with nocturnal shortness of breath and generalized edema and admitted for fluid overload required critical care management of acute hypoxic respiratory failure, vent dependent, as well as acute kidney injury possible transient hemodialysis.  Patient extubated and off hemodialysis currently, making adequate urinary output.  She is transferred to select service for continued care on the floor  Assessment & Plan    Principal Problem:   Acute kidney injury superimposed on chronic kidney disease ([place]) Active Problems:   Type 2 diabetes mellitus with diabetic polyneuropathy, without long-term current use of insulin (HCC)   Normocytic anemia   Schizophrenia ([place])   Depression   Essential hypertension   Chronic diastolic heart failure (HCC)   CKD (chronic kidney disease), stage IV (HCC)   Obesity, Class III, BMI 40-49.9 (morbid obesity) ([place])   Type 2 diabetes mellitus (HCC)   Hypothyroidism   Volume overload   On mechanically assisted ventilation (HCC)   Acute metabolic encephalopathy   Acute respiratory failure with hypoxia and hypercapnia (HCC)   SVT (supraventricular tachycardia) (HCC)   Acute pulmonary edema (HCC)  Acute hypoxic and hypercarbic respiratory failure In the setting of volume overload, anarsarca/pleural effusions with acute renal failure Status post 3 intubations-left extubation  09/20/2019  Acute renal failure status post hemodialysis-off hemodialysis with adequate urine output However creatinine remains high Monitor renal function electrolytes Nephrology consulted  Anasarca/pleural effusions Diuresis as tolerated  Acute metabolic encephalopathy Secondary to hypercarbia Improving Supportive care  Paroxysmal atrial fibrillation CHADS2VASC score 5 During this admission Apixaban initiated  Hypertension Monitor hemodynamics with up titration of hypertensive regimen as needed  Moderate protein calorie malnutrition Nutritional support optimization   Code Status: Full code DVT Prophylaxis: On apixaban Family Communication: No family at bedside  Disposition Plan: LTAC-for continued daily physician supervised care on account of recurrent endotracheal intubations due to stridor, multiple medical problems that will need continued monitoring with high risk for decompensation  Time Spent in minutes  35 minutes  Procedures:    Consultants:   Nephrology Pulmonary critical care  Antimicrobials:      Medications  Scheduled Meds: . apixaban  2.5 mg Oral BID  . aspirin  81 mg Oral q morning - 10a  . atorvastatin  20 mg Oral q1800  . calcitRIOL  0.25 mcg Oral Q M,W,F  . carvedilol  25 mg Oral BID WC  . Chlorhexidine Gluconate Cloth  6 each Topical Q0600  . cholecalciferol  4,000 Units Oral q  morning - 10a  . darbepoetin (ARANESP) injection - NON-DIALYSIS  150 mcg Subcutaneous Q Thu-1800  . docusate  100 mg Oral BID  . fentaNYL (SUBLIMAZE) injection  25 mcg Intravenous Once  . furosemide  40 mg Oral Daily  . gabapentin  300 mg Oral QHS  . hydrALAZINE  100 mg Oral Q8H  . influenza vaccine adjuvanted  0.5 mL Intramuscular Tomorrow-1000  . insulin aspart  0-9 Units Subcutaneous TID WC  . insulin glargine  8 Units Subcutaneous QHS  . levothyroxine  25 mcg Oral Q0600  . mouth rinse  15 mL Mouth Rinse BID  . montelukast  10 mg Oral QHS  .  multivitamin  15 mL Oral Daily  . neomycin-bacitracin-polymyxin  1 application Topical Daily  . potassium chloride  40 mEq Oral Once  . predniSONE  10 mg Oral Q breakfast  . primidone  50 mg Oral q morning - 10a  . pyridOXINE  200 mg Oral BID  . risperiDONE  0.5 mg Oral BID  . sodium chloride flush  3 mL Intravenous Q12H  . traZODone  50 mg Oral Daily   Continuous Infusions: . sodium chloride    . sodium chloride    . sodium chloride    . sodium chloride Stopped (09/21/19 1105)  . sodium chloride    . sodium chloride     PRN Meds:.sodium chloride, sodium chloride, sodium chloride, sodium chloride, sodium chloride, sodium chloride, acetaminophen **OR** acetaminophen, albuterol, alteplase, haloperidol lactate, heparin, heparin, heparin, hydrOXYzine, labetalol, lidocaine (PF), lidocaine-prilocaine, metoprolol tartrate, ondansetron **OR** ondansetron (ZOFRAN) IV, pentafluoroprop-tetrafluoroeth, polyethylene glycol, sodium chloride flush   Antibiotics   Anti-infectives (From admission, onward)   Start     Dose/Rate Route Frequency Ordered Stop   09/14/19 1500  meropenem (MERREM) 500 mg in sodium chloride 0.9 % 100 mL IVPB  Status:  Discontinued     500 mg 200 mL/hr over 30 Minutes Intravenous Every 24 hours 09/14/19 1345 09/17/19 0821   09/14/19 1400  vancomycin (VANCOREADY) IVPB 2000 mg/400 mL     2,000 mg 200 mL/hr over 120 Minutes Intravenous  Once 09/14/19 1328 09/14/19 1832   09/14/19 1346  vancomycin variable dose per unstable renal function (pharmacist dosing)  Status:  Discontinued      Does not apply See admin instructions 09/14/19 1346 09/16/19 0929   09/14/19 0600  vancomycin (VANCOCIN) IVPB 1000 mg/200 mL premix  Status:  Discontinued     1,000 mg 200 mL/hr over 60 Minutes Intravenous On call to O.R. 09/13/19 1634 09/14/19 1219   09/11/19 1100  levofloxacin (LEVAQUIN) IVPB 500 mg  Status:  Discontinued     500 mg 100 mL/hr over 60 Minutes Intravenous Every 48 hours  09/09/19 1041 09/14/19 1259   09/09/19 1045  levofloxacin (LEVAQUIN) IVPB 750 mg     750 mg 100 mL/hr over 90 Minutes Intravenous  Once 09/09/19 1041 09/09/19 2208        Subjective:   [name] was seen and examined today.   Patient denies dizziness, chest pain, no N/V. No acute events overnight.    Objective:   Vitals:   09/21/19 1400 09/21/19 1822 09/21/19 2121 09/22/19 0518  BP:  (!) 167/66 (!) 184/74 (!) 161/68  Pulse:  70 86 72  Resp:  18 20 16   Temp:  98.6 F (37 C) 98.5 F (36.9 C) 98.1 F (36.7 C)  TempSrc:   Oral Oral  SpO2: 95% 99% 96% 100%  Weight:      Height:  Intake/Output Summary (Last 24 hours) at 09/22/2019 1011 Last data filed at 09/22/2019 [id number] Gross per 24 hour  Intake 461.67 ml  Output 1250 ml  Net -788.33 ml     Wt Readings from Last 3 Encounters:  09/21/19 89 kg  06/06/19 90.3 kg  11/28/18 96.1 kg     Exam  General: NAD  HEENT: NCAT,  PERRL,MMM  Neck: SUPPLE, (-) JVD  Cardiovascular: RRR, (-) GALLOP, (-) MURMUR  Respiratory: Mildly diminished breath sounds at the bases otherwise clear to auscultation  Gastrointestinal: SOFT, (-) DISTENSION, BS(+), (_) TENDERNESS  Ext: (-) CYANOSIS, (+) EDEMA  Neuro: Mild lethargic this morning but arousable     Data Reviewed:  I have personally reviewed following labs and imaging studies  Micro Results Recent Results (from the past 240 hour(s))  Culture, respiratory (non-expectorated)     Status: None   Collection Time: 09/14/19  1:00 PM   Specimen: Tracheal Aspirate; Respiratory  Result Value Ref Range Status   Specimen Description   Final    TRACHEAL ASPIRATE Performed at [facility], [address], [place], [place] [address]    Special Requests   Final    Normal Performed at [facility], [address] [place], [place] [address]    Gram Stain   Final    ABUNDANT WBC PRESENT, PREDOMINANTLY PMN FEW GRAM POSITIVE RODS RARE GRAM POSITIVE COCCI    Culture   Final      RARE Consistent with normal respiratory flora. Performed at [facility], [place] [address], [place], [place] [address]    Report Status 09/16/2019 FINAL  Final    Radiology Reports CT HEAD WO CONTRAST  Result Date: 09/11/2019 CLINICAL DATA:  Onset left-side neglect today. EXAM: CT HEAD WITHOUT CONTRAST TECHNIQUE: Contiguous axial images were obtained from the base of the skull through the vertex without intravenous contrast. COMPARISON:  Head CT 09/08/2019.  Brain MRI 09/27/2018. FINDINGS: Brain: No evidence of acute infarction, hemorrhage, hydrocephalus, extra-axial collection or mass lesion/mass effect. Vascular: No hyperdense vessel.  Atherosclerosis noted. Skull: Intact.  No focal lesion. Sinuses/Orbits: Small right mastoid effusion is seen. Mucous retention cyst or polyp left maxillary sinus noted. Mild mucosal thickening scattered ethmoid air cells, sphenoid sinuses and right maxillary sinus also noted. Other: None. IMPRESSION: No acute abnormality finding to explain the patient's symptoms. Atherosclerosis. Mild sinus disease. Electronically Signed   By: Inge Rise M.D.   On: 09/11/2019 15:41   CT HEAD WO CONTRAST  Result Date: 09/08/2019 CLINICAL DATA:  Altered mental status. EXAM: CT HEAD WITHOUT CONTRAST TECHNIQUE: Contiguous axial images were obtained from the base of the skull through the vertex without intravenous contrast. COMPARISON:  September 15, 2018 FINDINGS: Brain: There is mild cerebral atrophy with widening of the extra-axial spaces and ventricular dilatation. There are areas of decreased attenuation within the white matter tracts of the supratentorial brain, consistent with microvascular disease changes. A chronic left basal ganglia lacunar infarct is noted. Vascular: No hyperdense vessel or unexpected calcification. Skull: Normal. Negative for fracture or focal lesion. Sinuses/Orbits: A stable 2.0 cm x 1.4 cm polyp versus mucous retention cyst is seen within the  anterior aspect of the left maxillary sinus. Other: None. IMPRESSION: 1. Generalized cerebral atrophy. 2. No acute intracranial abnormality. 3. Chronic left basal ganglia lacunar infarct. 4. Stable left maxillary sinus polyp versus mucous retention cyst. Electronically Signed   By: Virgina Norfolk M.D.   On: 09/08/2019 16:48   CT ANGIO CHEST PE W OR WO  CONTRAST  Result Date: 09/08/2019 CLINICAL DATA:  Severe hypoxia. EXAM: CT ANGIOGRAPHY CHEST WITH CONTRAST TECHNIQUE: Multidetector CT imaging of the chest was performed using the standard protocol during bolus administration of intravenous contrast. Multiplanar CT image reconstructions and MIPs were obtained to evaluate the vascular anatomy. CONTRAST:  170mL OMNIPAQUE IOHEXOL 300 MG/ML  SOLN COMPARISON:  Jan 12, 2018 FINDINGS: Cardiovascular: There is mild calcification of the aortic arch. Satisfactory opacification of the pulmonary arteries to the segmental level. No evidence of pulmonary embolism. There is mild, stable cardiomegaly. No pericardial effusion. Marked severity coronary artery calcification is seen. Mediastinum/Nodes: No enlarged mediastinal, hilar, or axillary lymph nodes. Thyroid gland, trachea, and esophagus demonstrate no significant findings. Lungs/Pleura: Endotracheal and nasogastric tubes are present. Moderate severity atelectasis and/or infiltrate is seen within the posterior aspect of the left upper lobe and left lower lobe with marked severity consolidation seen throughout the entire right lower lobe. Moderate size bilateral pleural effusions are seen, right greater than left. No pneumothorax is seen. Upper Abdomen: No acute abnormality. Musculoskeletal: Multilevel degenerative changes seen throughout the thoracic spine Review of the MIP images confirms the above findings. IMPRESSION: 1. No CT evidence of acute pulmonary embolism. 2. Moderate severity left upper lobe and right lower lobe atelectasis and/or infiltrate with marked severity  consolidation seen throughout the entire right lower lobe. 3. Moderate size bilateral pleural effusions, right greater than left. Electronically Signed   By: Virgina Norfolk M.D.   On: 09/08/2019 22:13   US RENAL  Result Date: 09/06/2019 CLINICAL DATA:  Acute kidney injury EXAM: RENAL / URINARY TRACT ULTRASOUND COMPLETE COMPARISON:  CT abdomen pelvis 06/01/2018 FINDINGS: Right Kidney: Renal measurements: 8.2 x 4.5 x 5.3 cm = volume: 102 mL. Poor visualization of RIGHT kidney due to body habitus. Increased cortical echogenicity. Cortical thinning. No gross evidence of mass or hydronephrosis on limited assessment Left Kidney: Renal measurements: 9.4 x 4.5 x 5.3 cm = volume: 130 mL. Cortical thinning with increased cortical echogenicity. No mass or hydronephrosis. Bladder: Well distended, unremarkable.  Patient was unable to void. Other: N/A IMPRESSION: Limited visualization of RIGHT kidney due to body habitus. BILATERAL renal cortical atrophy and medical renal disease changes. No gross evidence of mass or hydronephrosis within limitations of exam. Electronically Signed   By: Lavonia Dana M.D.   On: 09/06/2019 10:47   DG Chest Port 1 View  Result Date: 09/17/2019 CLINICAL DATA:  Respiratory failure. EXAM: PORTABLE CHEST 1 VIEW COMPARISON:  09/16/2019 FINDINGS: The endotracheal tube is 12 mm above the carina. The NG tube is coursing down the esophagus and into the stomach. Moderate central vascular congestion, pulmonary edema and bilateral pleural effusions with overlying atelectasis, right greater than left. IMPRESSION: 1. Stable support apparatus. 2. Moderate central vascular congestion, probable perihilar pulmonary edema and persistent effusions and overlying atelectasis. Electronically Signed   By: Marijo Sanes M.D.   On: 09/17/2019 07:03   DG CHEST PORT 1 VIEW  Result Date: 09/16/2019 CLINICAL DATA:  Oxygen desaturation. EXAM: PORTABLE CHEST 1 VIEW COMPARISON:  September 14, 2019 FINDINGS: The ETT  terminates 9 mm above the carina. The NG tube terminates in the right mid abdomen. Small bilateral layering effusions with underlying opacities, likely atelectasis. No pneumothorax. No other interval changes. IMPRESSION: 1. The ETT terminates 9 mm above the carina. Recommend withdrawing 2 cm. The NG tube terminates in either the distal stomach or proximal duodenum. 2. Small bilateral pleural effusions with underlying opacities, probably atelectasis. 3. No other acute abnormalities or changes.  These results will be called to the ordering clinician or representative by the Radiologist Assistant, and communication documented in the PACS or zVision Dashboard. Electronically Signed   By: Dorise Bullion III M.D   On: 09/16/2019 16:47   DG Chest Port 1 View  Result Date: 09/14/2019 CLINICAL DATA:  Attempted placement of dialysis catheter. EXAM: PORTABLE CHEST 1 VIEW COMPARISON:  One-view chest x-ray 09/13/2019 FINDINGS: The heart is enlarged. Patient remains intubated. NG tube terminates in the stomach. Right pleural effusion is noted within the right minor fissure and at the apex, likely redistributed due to positioning. Increased right upper lobe airspace disease is present. The right base is clear. The left lung is mostly clear. IMPRESSION: 1. Increased right upper lobe airspace disease concerning for pneumonia. 2. Small right pleural effusion. 3. Cardiomegaly without failure. Electronically Signed   By: San Morelle M.D.   On: 09/14/2019 12:33   DG Chest Port 1 View  Result Date: 09/13/2019 CLINICAL DATA:  Respiratory failure with hypoxemia. EXAM: PORTABLE CHEST 1 VIEW COMPARISON:  Chest radiograph 09/10/2019, chest CT 09/08/2019 FINDINGS: ET tube is unchanged position with tip terminating at the level of the clavicular heads. An enteric tube passes below level of left hemidiaphragm with tip excluded from the field of view. Overlying cardiac monitoring leads. Unchanged cardiomediastinal silhouette with  cardiomegaly. Right greater than left pleural effusions and bibasilar atelectasis, similar to prior examination. No evidence of pneumothorax. IMPRESSION: Support apparatus as described. Similar appearance of right greater than left pleural effusions and bibasilar atelectasis. Pneumonia at the lung bases cannot be excluded. Unchanged cardiomegaly. Electronically Signed   By: Kellie Simmering DO   On: 09/13/2019 07:13   DG CHEST PORT 1 VIEW  Result Date: 09/10/2019 CLINICAL DATA:  Acute respiratory failure with hypoxia EXAM: PORTABLE CHEST 1 VIEW COMPARISON:  Yesterday FINDINGS: Small pleural effusions with superimposed lower lobe atelectasis by most recent chest CT. Cardiomegaly. Endotracheal tube tip is at the clavicular heads, slightly higher than before. The enteric tube at least reaches the stomach. IMPRESSION: Continued low volume chest with layering effusions and lower lobe atelectasis. Electronically Signed   By: Monte Fantasia M.D.   On: 09/10/2019 05:45   Portable Chest xray  Result Date: 09/09/2019 CLINICAL DATA:  Respiratory failure EXAM: PORTABLE CHEST 1 VIEW COMPARISON:  CTA chest dated 09/08/2019 FINDINGS: Endotracheal tube terminates 2.5 cm above the carina. Enteric tube courses into the stomach. Moderate layering right pleural effusion. Associated right lower lobe opacity, likely atelectasis. Small left pleural effusion. Pulmonary vascular congestion without frank interstitial edema. Cardiomegaly. IMPRESSION: Endotracheal tube terminates 2.5 cm above the carina. Cardiomegaly with pulmonary vascular congestion. No frank interstitial edema. Moderate right and small left pleural effusions. Associated right lower lobe opacity, likely atelectasis. Electronically Signed   By: Julian Hy M.D.   On: 09/09/2019 07:10   DG CHEST PORT 1 VIEW  Result Date: 09/08/2019 CLINICAL DATA:  Endotracheal tube placement EXAM: PORTABLE CHEST 1 VIEW COMPARISON:  09/18/2018.  09/08/2019 FINDINGS: The heart  size remains enlarged. Endotracheal tube terminates approximately 3.7 cm above the carina. The enteric tube extends below the left hemidiaphragm. Moderate to large bilateral pleural effusions are noted. Bibasilar airspace disease is noted which is favored to represent atelectasis. There is no pneumothorax. Vascular congestion is noted. IMPRESSION: 1. Lines and tubes as above. 2. Persistent findings of congestive heart failure with moderate to large bilateral pleural effusions. Electronically Signed   By: Constance Holster M.D.   On: 09/08/2019 17:23  DG CHEST PORT 1 VIEW  Result Date: 09/08/2019 CLINICAL DATA:  Status post intubation. EXAM: PORTABLE CHEST 1 VIEW COMPARISON:  September 06, 2019. FINDINGS: An endotracheal tube is seen with its distal tip approximately 1.7 cm from the carina. This represents a new finding when compared to the prior study. A nasogastric tube is seen with its distal end extending below the level of the diaphragm. This also represents a new finding. There is no evidence of focal consolidation, pleural effusion or pneumothorax. The cardiac silhouette is moderately enlarged. Multilevel degenerative changes seen throughout the thoracic spine. IMPRESSION: 1. Interval endotracheal tube and nasogastric tube placement and positioning, as described above, when compared to the prior study dated September 06, 2019. Electronically Signed   By: Virgina Norfolk M.D.   On: 09/08/2019 16:24   DG Chest Port 1 View  Result Date: 09/06/2019 CLINICAL DATA:  Shortness of breath. EXAM: PORTABLE CHEST 1 VIEW COMPARISON:  One-view chest x-ray 08/29/2018 FINDINGS: Heart is enlarged. Mild interstitial opacities slightly more prominent than on the prior study. Heart size and interstitial pattern is exaggerated by low lung volumes. No significant airspace consolidation is present. No significant effusions are present. IMPRESSION: 1. Cardiomegaly with mild interstitial edema suggesting congestive heart  failure. 2. Low lung volumes. Electronically Signed   By: San Morelle M.D.   On: 09/06/2019 04:29   DG C-Arm 1-60 Min-No Report  Result Date: 09/14/2019 Fluoroscopy was utilized by the requesting physician.  No radiographic interpretation.   VAS [place] UPPER EXT VEIN MAPPING (PRE-OP AVF)  Result Date: 09/17/2019 UPPER EXTREMITY VEIN MAPPING  Indications: Pre-access. Comparison Study: no prior Performing Technologist: Abram Sander RVS  Examination Guidelines: A complete evaluation includes B-mode imaging, spectral Doppler, color Doppler, and power Doppler as needed of all accessible portions of each vessel. Bilateral testing is considered an integral part of a complete examination. Limited examinations for reoccurring indications may be performed as noted. +-----------------+-------------+----------+--------------+ Right Cephalic   Diameter (cm)Depth (cm)   Findings    +-----------------+-------------+----------+--------------+ Shoulder             0.28        1.44                  +-----------------+-------------+----------+--------------+ Prox upper arm       0.31        1.32                  +-----------------+-------------+----------+--------------+ Mid upper arm        0.47        0.91                  +-----------------+-------------+----------+--------------+ Dist upper arm       0.32        0.42                  +-----------------+-------------+----------+--------------+ Antecubital fossa    0.44        0.32                  +-----------------+-------------+----------+--------------+ Prox forearm         0.32        0.59                  +-----------------+-------------+----------+--------------+ Mid forearm          0.35        0.52     branching    +-----------------+-------------+----------+--------------+ Dist forearm  not visualized +-----------------+-------------+----------+--------------+  +-----------------+-------------+----------+--------+ Right Basilic    Diameter (cm)Depth (cm)Findings +-----------------+-------------+----------+--------+ Prox upper arm       0.54        1.24            +-----------------+-------------+----------+--------+ Mid upper arm        0.43        0.82            +-----------------+-------------+----------+--------+ Dist upper arm       0.38        0.48            +-----------------+-------------+----------+--------+ Antecubital fossa    0.43        0.48            +-----------------+-------------+----------+--------+ Prox forearm         0.40        0.67            +-----------------+-------------+----------+--------+ Mid forearm          0.38        0.75            +-----------------+-------------+----------+--------+ Distal forearm       0.35        0.59            +-----------------+-------------+----------+--------+ +-----------------+-------------+----------+------------------------------+ Left Cephalic    Diameter (cm)Depth (cm)           Findings            +-----------------+-------------+----------+------------------------------+ Shoulder             0.29        1.07                                  +-----------------+-------------+----------+------------------------------+ Prox upper arm       0.26        0.72                                  +-----------------+-------------+----------+------------------------------+ Mid upper arm                           not visualized due to bandages +-----------------+-------------+----------+------------------------------+ Dist upper arm                          not visualized due to bandages +-----------------+-------------+----------+------------------------------+ Antecubital fossa    0.38        0.33                                  +-----------------+-------------+----------+------------------------------+ Prox forearm         0.32        0.47                                   +-----------------+-------------+----------+------------------------------+ Mid forearm                                     not visualized         +-----------------+-------------+----------+------------------------------+ Dist forearm  not visualized         +-----------------+-------------+----------+------------------------------+ +-----------------+-------------+----------+------------------------------+ Left Basilic     Diameter (cm)Depth (cm)           Findings            +-----------------+-------------+----------+------------------------------+ Prox upper arm       0.43        0.76                                  +-----------------+-------------+----------+------------------------------+ Mid upper arm        0.44        1.06                                  +-----------------+-------------+----------+------------------------------+ Dist upper arm                          not visualized due to bandages +-----------------+-------------+----------+------------------------------+ Antecubital fossa    0.36        1.44                                  +-----------------+-------------+----------+------------------------------+ Prox forearm         0.25        0.81                                  +-----------------+-------------+----------+------------------------------+ Mid forearm          0.27        0.55                                  +-----------------+-------------+----------+------------------------------+ Distal forearm       0.24        0.52                                  +-----------------+-------------+----------+------------------------------+ *See table(s) above for measurements and observations.  Diagnosing physician: Ruta Hinds MD Electronically signed by Ruta Hinds MD on 09/17/2019 at 5:01:41 PM.    Final     Lab Data:  CBC: Recent Labs  Lab 09/16/19 0243  09/16/19 0356 09/17/19 0320 09/17/19 0409 09/21/19 1409  WBC 10.3  --   --  9.7 10.7*  HGB 8.7* 10.5* 10.2* 8.3* 9.6*  HCT 26.8* 31.0* 30.0* 25.4* 29.4*  MCV 99.3  --   --  97.7 98.7  PLT 220  --   --  253 0000000   Basic Metabolic Panel: Recent Labs  Lab 09/16/19 0243 09/16/19 0356 09/17/19 0409 09/17/19 0409 09/18/19 0314 09/19/19 0404 09/20/19 0231 09/21/19 0252 09/22/19 0417  NA 141   < > 143   < > 138 142 141 143 142  K 3.7   < > 3.2*   < > 3.5 4.0 3.3* 3.6 3.5  CL 102   < > 103   < > 102 106 104 104 106  CO2 23   < > 23   < > 26 26 26 26 27   GLUCOSE 219*   < > 188*   < > 317* 189* 237* 89 156*  BUN 55*   < > 74*   < > 48* 74* 60* 74* 73*  CREATININE 4.90*   < > 5.50*   < > 3.04* 3.85* 3.30* 3.86* 4.59*  CALCIUM 9.0   < > 8.8*   < > 8.4* 8.5* 8.7* 8.8* 8.8*  MG 2.2  --  2.4  --   --   --   --   --   --   PHOS 6.4*   < > 6.7*   < > 3.5 2.8 3.0 2.8 4.3   < > = values in this interval not displayed.   GFR: Estimated Creatinine Clearance: 12.3 mL/min (A) (by C-G formula based on SCr of 4.59 mg/dL (H)). Liver Function Tests: Recent Labs  Lab 09/18/19 0314 09/19/19 0404 09/20/19 0231 09/21/19 0252 09/22/19 0417  ALBUMIN 1.9* 1.9* 2.1* 2.0* 2.1*   No results for input(s): LIPASE, AMYLASE in the last 168 hours. No results for input(s): AMMONIA in the last 168 hours. Coagulation Profile: No results for input(s): INR, PROTIME in the last 168 hours. Cardiac Enzymes: No results for input(s): CKTOTAL, CKMB, CKMBINDEX, TROPONINI in the last 168 hours. BNP (last 3 results) No results for input(s): PROBNP in the last 8760 hours. HbA1C: No results for input(s): HGBA1C in the last 72 hours. CBG: Recent Labs  Lab 09/21/19 0718 09/21/19 1134 09/21/19 1639 09/21/19 2122 09/22/19 0653  GLUCAP 107* 161* 237* 186* 140*   Lipid Profile: No results for input(s): CHOL, HDL, LDLCALC, TRIG, CHOLHDL, LDLDIRECT in the last 72 hours. Thyroid Function Tests: No results for  input(s): TSH, T4TOTAL, FREET4, T3FREE, THYROIDAB in the last 72 hours. Anemia Panel: No results for input(s): VITAMINB12, FOLATE, FERRITIN, TIBC, IRON, RETICCTPCT in the last 72 hours. Urine analysis:    Component Value Date/Time   COLORURINE YELLOW 09/08/2019 1231   APPEARANCEUR CLEAR 09/08/2019 1231   LABSPEC 1.010 09/08/2019 1231   PHURINE 6.0 09/08/2019 1231   GLUCOSEU 150 (A) 09/08/2019 1231   HGBUR NEGATIVE 09/08/2019 1231   HGBUR negative 10/20/2009 1425   BILIRUBINUR NEGATIVE 09/08/2019 1231   KETONESUR NEGATIVE 09/08/2019 1231   PROTEINUR >=300 (A) 09/08/2019 1231   UROBILINOGEN 0.2 10/05/2011 1002   NITRITE NEGATIVE 09/08/2019 1231   LEUKOCYTESUR SMALL (A) 09/08/2019 1231     Benito Mccreedy M.D. [facility] 09/22/2019, 10:11 AM  Pager: [phone] Between 7am to 7pm - call Pager - [phone]  After 7pm go to [email or web address] - password [email or web address]  Call night coverage person covering after 7pm

## 2019-09-22 NOTE — Plan of Care (Signed)
  Problem: Education: Goal: Knowledge of General Education information will improve Description Including pain rating scale, medication(s)/side effects and non-pharmacologic comfort measures Outcome: Progressing   

## 2019-09-22 NOTE — Progress Notes (Signed)
[facility] NEPHROLOGY PROGRESS NOTE  Assessment/ Plan: Pt is a 69 y.o. yo female with COPD, HTN, DM, HLD, CHF, schizoaffective disorder, CKD admitted with respiratory failure and AKI.  #AKI: Started HD on 1/27 at [place] s/p HD on, 2/1, HD 2/3.  -She is now extubated, decent urine output however the creatinine level is trending up to 4.5 today.  Her volume status looks acceptable.  More alert and awake. -No plan for dialysis, we will watch for renal recovery through the weekend.  I will keep the temporary catheter for now.  Change Lasix to p.o.  # Acute hypoxic RF: Extubated now. Per PCCM.  Now transferred out of ICU.  # Anemia: ESA 150 q 2week and iron sat 26 %.  Monitor hemoglobin.  # CKD-MBD: Calcium, phosphorus acceptable.  Discontinue Auryxia.  Monitor electrolytes.  # Hypertension: Blood pressure elevated.  I increase the dose of hydralazine to 100 mg twice daily.  Continue Lasix and carvedilol.  Monitor blood pressure.  Avoid hypotensive episode.  #Dispo: pending  Subjective: Seen and examined at bedside.  Patient was transferred out of ICU.  Looks more alert awake.  Denies nausea vomiting chest pain shortness of breath.  Objective Vital signs in last 24 hours: Vitals:   09/21/19 1400 09/21/19 1822 09/21/19 2121 09/22/19 0518  BP:  (!) 167/66 (!) 184/74 (!) 161/68  Pulse:  70 86 72  Resp:  18 20 16   Temp:  98.6 F (37 C) 98.5 F (36.9 C) 98.1 F (36.7 C)  TempSrc:   Oral Oral  SpO2: 95% 99% 96% 100%  Weight:      Height:       Weight change:   Intake/Output Summary (Last 24 hours) at 09/22/2019 0934 Last data filed at 09/22/2019 [id number] Gross per 24 hour  Intake 481.6 ml  Output 1325 ml  Net -843.4 ml       Labs: Basic Metabolic Panel: Recent Labs  Lab 09/20/19 0231 09/21/19 0252 09/22/19 0417  NA 141 143 142  K 3.3* 3.6 3.5  CL 104 104 106  CO2 26 26 27   GLUCOSE 237* 89 156*  BUN 60* 74* 73*  CREATININE 3.30* 3.86* 4.59*  CALCIUM  8.7* 8.8* 8.8*  PHOS 3.0 2.8 4.3   Liver Function Tests: Recent Labs  Lab 09/20/19 0231 09/21/19 0252 09/22/19 0417  ALBUMIN 2.1* 2.0* 2.1*   No results for input(s): LIPASE, AMYLASE in the last 168 hours. No results for input(s): AMMONIA in the last 168 hours. CBC: Recent Labs  Lab 09/16/19 0243 09/16/19 0356 09/17/19 0320 09/17/19 0409 09/21/19 1409  WBC 10.3  --   --  9.7 10.7*  HGB 8.7*   < > 10.2* 8.3* 9.6*  HCT 26.8*   < > 30.0* 25.4* 29.4*  MCV 99.3  --   --  97.7 98.7  PLT 220  --   --  253 261   < > = values in this interval not displayed.   Cardiac Enzymes: No results for input(s): CKTOTAL, CKMB, CKMBINDEX, TROPONINI in the last 168 hours. CBG: Recent Labs  Lab 09/21/19 0718 09/21/19 1134 09/21/19 1639 09/21/19 2122 09/22/19 0653  GLUCAP 107* 161* 237* 186* 140*    Iron Studies: No results for input(s): IRON, TIBC, TRANSFERRIN, FERRITIN in the last 72 hours. Studies/Results: No results found.  Medications: Infusions: . sodium chloride    . sodium chloride    . sodium chloride    . sodium chloride Stopped (09/21/19 1105)  . sodium  chloride    . sodium chloride      Scheduled Medications: . apixaban  2.5 mg Oral BID  . aspirin  81 mg Oral q morning - 10a  . atorvastatin  20 mg Oral q1800  . calcitRIOL  0.25 mcg Oral Q M,W,F  . carvedilol  25 mg Oral BID WC  . Chlorhexidine Gluconate Cloth  6 each Topical Q0600  . cholecalciferol  4,000 Units Oral q morning - 10a  . darbepoetin (ARANESP) injection - NON-DIALYSIS  150 mcg Subcutaneous Q Thu-1800  . docusate  100 mg Oral BID  . fentaNYL (SUBLIMAZE) injection  25 mcg Intravenous Once  . furosemide  40 mg Intravenous Daily  . gabapentin  300 mg Oral QHS  . hydrALAZINE  75 mg Oral Q8H  . influenza vaccine adjuvanted  0.5 mL Intramuscular Tomorrow-1000  . insulin aspart  0-9 Units Subcutaneous TID WC  . insulin glargine  8 Units Subcutaneous QHS  . levothyroxine  25 mcg Oral Q0600  . mouth  rinse  15 mL Mouth Rinse BID  . montelukast  10 mg Oral QHS  . multivitamin  15 mL Oral Daily  . neomycin-bacitracin-polymyxin  1 application Topical Daily  . predniSONE  10 mg Oral Q breakfast  . primidone  50 mg Oral q morning - 10a  . pyridOXINE  200 mg Oral BID  . risperiDONE  0.5 mg Oral BID  . sodium chloride flush  3 mL Intravenous Q12H  . traZODone  50 mg Oral Daily    have reviewed scheduled and prn medications.  Physical Exam: General:NAD, comfortable, able to lie flat Heart:RRR, s1s2 nl Lungs:clear b/l, no crackle Abdomen:soft, Non-tender, non-distended Extremities:No edema Dialysis Access: Groin temporary catheter.  [name] [name] 09/22/2019,9:34 AM  LOS: 16 days  Pager: [phone]

## 2019-09-22 NOTE — Progress Notes (Addendum)
HD cath site assessment: L femoral cath with large amount of congealed blood under dressing. Distal end of clot removed. Site cleaned and new dressing applied. No bleeding noted with dressing change. Instructed bedside RN to add bulky gauze to top of dressing if bleeding is noted. Rounding nephrologist Dr Carolin Sicks made aware.

## 2019-09-22 NOTE — Progress Notes (Signed)
Modified Barium Swallow Progress Note  Patient Details  Name: [name] MRN: [id number] Date of Birth: 1951-03-05  Today's Date: 09/22/2019  Modified Barium Swallow completed.  Full report located under Chart Review in the Imaging Section.  Brief recommendations include the following:  Clinical Impression  Patient presents with mild oral and a primary sensorimotor based pharyngeal dysphagia. She exhibited deep penetration to vocal cords with first sip of thin liquids and with trace amount of that penetrate resulting in trace aspiration. (View was slightly obscured by her shoulder). Penetration during prior to the swallow was secondary to poor epilgottic inversion leading to poor airway protection. Subsequent swallows of thin liquids, even with straw sips, resulted only in flash penetration. No residuals remained in pharynx. As compared to previous MBS (about a year ago), patient's swallow is slightly improved.    Swallow Evaluation Recommendations       SLP Diet Recommendations: Dysphagia 3 (Mech soft) solids;Thin liquid   Liquid Administration via: Cup;Straw   Medication Administration: Whole meds with liquid   Supervision: Patient able to self feed;Intermittent supervision to cue for compensatory strategies   Compensations: Minimize environmental distractions;Slow rate;Small sips/bites   Postural Changes: Seated upright at 90 degrees   Oral Care Recommendations: Oral care BID        Dannial Monarch 09/22/2019,5:23 PM    Sonia Baller, MA, CCC-SLP Speech Therapy [facility] Acute Rehab Pager: [phone]

## 2019-09-23 DIAGNOSIS — R0602 Shortness of breath: Secondary | ICD-10-CM | POA: Diagnosis not present

## 2019-09-23 DIAGNOSIS — N189 Chronic kidney disease, unspecified: Secondary | ICD-10-CM | POA: Diagnosis not present

## 2019-09-23 DIAGNOSIS — N179 Acute kidney failure, unspecified: Secondary | ICD-10-CM | POA: Diagnosis not present

## 2019-09-23 DIAGNOSIS — E8779 Other fluid overload: Secondary | ICD-10-CM | POA: Diagnosis not present

## 2019-09-23 LAB — RENAL FUNCTION PANEL
Albumin: 2 g/dL — ABNORMAL LOW (ref 3.5–5.0)
Anion gap: 11 (ref 5–15)
BUN: 75 mg/dL — ABNORMAL HIGH (ref 8–23)
CO2: 25 mmol/L (ref 22–32)
Calcium: 8.4 mg/dL — ABNORMAL LOW (ref 8.9–10.3)
Chloride: 106 mmol/L (ref 98–111)
Creatinine, Ser: 5.15 mg/dL — ABNORMAL HIGH (ref 0.44–1.00)
GFR calc Af Amer: 9 mL/min — ABNORMAL LOW (ref >60)
GFR calc non Af Amer: 8 mL/min — ABNORMAL LOW (ref >60)
Glucose, Bld: 212 mg/dL — ABNORMAL HIGH (ref 70–99)
Phosphorus: 4.4 mg/dL (ref 2.5–4.6)
Potassium: 3.6 mmol/L (ref 3.5–5.1)
Sodium: 142 mmol/L (ref 135–145)

## 2019-09-23 LAB — GLUCOSE, CAPILLARY
Glucose-Capillary: 172 mg/dL — ABNORMAL HIGH (ref 70–99)
Glucose-Capillary: 227 mg/dL — ABNORMAL HIGH (ref 70–99)
Glucose-Capillary: 324 mg/dL — ABNORMAL HIGH (ref 70–99)
Glucose-Capillary: 329 mg/dL — ABNORMAL HIGH (ref 70–99)

## 2019-09-23 LAB — OCCULT BLOOD X 1 CARD TO LAB, STOOL: Fecal Occult Bld: NEGATIVE

## 2019-09-23 MED ORDER — POTASSIUM CHLORIDE CRYS ER 20 MEQ PO TBCR
40.0000 meq | EXTENDED_RELEASE_TABLET | Freq: Once | ORAL | Status: AC
  Administered 2019-09-23: 40 meq via ORAL
  Filled 2019-09-23: qty 2

## 2019-09-23 MED ORDER — GABAPENTIN 100 MG PO CAPS
100.0000 mg | ORAL_CAPSULE | Freq: Two times a day (BID) | ORAL | Status: DC
  Administered 2019-09-23 – 2019-09-24 (×4): 100 mg via ORAL
  Filled 2019-09-23 (×5): qty 1

## 2019-09-23 MED ORDER — INSULIN GLARGINE 100 UNIT/ML ~~LOC~~ SOLN
12.0000 [IU] | Freq: Every day | SUBCUTANEOUS | Status: DC
  Administered 2019-09-23 – 2019-09-30 (×8): 12 [IU] via SUBCUTANEOUS
  Filled 2019-09-23 (×9): qty 0.12

## 2019-09-23 MED ORDER — ALUM & MAG HYDROXIDE-SIMETH 200-200-20 MG/5ML PO SUSP
30.0000 mL | Freq: Four times a day (QID) | ORAL | Status: DC | PRN
  Administered 2019-09-23 – 2019-10-01 (×3): 30 mL via ORAL
  Filled 2019-09-23 (×3): qty 30

## 2019-09-23 NOTE — Plan of Care (Signed)
  Problem: Nutrition: Goal: Adequate nutrition will be maintained Outcome: Progressing   

## 2019-09-23 NOTE — Progress Notes (Signed)
Patient place on CPAP of [id number] with no complications. Patient resting comfortably.  RT will continue to monitor

## 2019-09-23 NOTE — Progress Notes (Signed)
[facility] NEPHROLOGY PROGRESS NOTE  Assessment/ Plan: Pt is a 69 y.o. yo female with COPD, HTN, DM, HLD, CHF, schizoaffective disorder, CKD admitted with respiratory failure and AKI.  #AKI: Started HD on 1/27 at [place] s/p HD on, 2/1, HD 2/3.  -She is now extubated, decent urine output however the creatinine level is trending up to 4.59 today.  Her volume status looks acceptable.  More alert and awake today. -No plan for dialysis, we will watch for renal recovery through the weekend.  May need dialysis if no improvement.  I will keep the temporary catheter for now.  On oral Lasix.    # Acute hypoxic RF: Extubated now.   Improved.  Now transferred out of ICU.  # Anemia: ESA 150 q 2week and iron sat 26 %.  Monitor hemoglobin.  # CKD-MBD: Calcium, phosphorus acceptable.  Discontinue Auryxia.  Monitor electrolytes.  # Hypertension: Blood pressure elevated.  I increase the dose of hydralazine to 100 mg twice daily.  Continue Lasix and carvedilol.  Monitor blood pressure.  Avoid hypotensive episode.  #Dispo: pending  Subjective: Seen and examined at bedside.  No new event.  Urine output 1325 cc.  Denies nausea vomiting chest pain shortness of breath.  In room air.  Reports lower extremity pain and has history of neuropathy. Objective Vital signs in last 24 hours: Vitals:   09/22/19 2057 09/22/19 2300 09/23/19 0445 09/23/19 0900  BP:   (!) 146/65 (!) 149/68  Pulse: 70  68 64  Resp: 15  16 18   Temp:   98.4 F (36.9 C) 98.2 F (36.8 C)  TempSrc:   Oral Oral  SpO2: 96%  96% 98%  Weight:  87.3 kg    Height:       Weight change:   Intake/Output Summary (Last 24 hours) at 09/23/2019 0951 Last data filed at 09/23/2019 0200 Gross per 24 hour  Intake 520 ml  Output 1325 ml  Net -805 ml       Labs: Basic Metabolic Panel: Recent Labs  Lab 09/20/19 0231 09/21/19 0252 09/22/19 0417  NA 141 143 142  K 3.3* 3.6 3.5  CL 104 104 106  CO2 26 26 27   GLUCOSE 237* 89  156*  BUN 60* 74* 73*  CREATININE 3.30* 3.86* 4.59*  CALCIUM 8.7* 8.8* 8.8*  PHOS 3.0 2.8 4.3   Liver Function Tests: Recent Labs  Lab 09/20/19 0231 09/21/19 0252 09/22/19 0417  ALBUMIN 2.1* 2.0* 2.1*   No results for input(s): LIPASE, AMYLASE in the last 168 hours. No results for input(s): AMMONIA in the last 168 hours. CBC: Recent Labs  Lab 09/17/19 0320 09/17/19 0409 09/21/19 1409  WBC  --  9.7 10.7*  HGB 10.2* 8.3* 9.6*  HCT 30.0* 25.4* 29.4*  MCV  --  97.7 98.7  PLT  --  253 261   Cardiac Enzymes: No results for input(s): CKTOTAL, CKMB, CKMBINDEX, TROPONINI in the last 168 hours. CBG: Recent Labs  Lab 09/22/19 0653 09/22/19 1108 09/22/19 1620 09/22/19 2024 09/23/19 0648  GLUCAP 140* 149* 300* 301* 172*    Iron Studies: No results for input(s): IRON, TIBC, TRANSFERRIN, FERRITIN in the last 72 hours. Studies/Results: DG Swallowing Func-Speech Pathology  Result Date: 09/22/2019 Objective Swallowing Evaluation: Type of Study: MBS-Modified Barium Swallow Study  Patient Details Name: Alesya Castellani MRN: [id number] Date of Birth: 1951-05-21 Today's Date: 09/22/2019 Time: SLP Start Time (ACUTE ONLY): [id number] -SLP Stop Time (ACUTE ONLY): 1155 SLP Time Calculation (min) (ACUTE ONLY): 10  min Past Medical History: Past Medical History: Diagnosis Date . Anxiety  . Asthma  . Back pain  . Barrett esophagus  . CHF (congestive heart failure) ([place])  . CKD (chronic kidney disease) stage 3, GFR 30-59 ml/min  . COPD (chronic obstructive pulmonary disease) ([place])  . Depression  . Diastolic heart failure ([place])  . Essential hypertension  . Fibromyalgia  . GERD (gastroesophageal reflux disease)  . Gout  . History of pericarditis  . Hyperlipidemia  . Hypothyroid  . Major depressive disorder  . Memory loss  . Nephrolithiasis   2008 . Obesity hypoventilation syndrome (H[facility])   CPAP . Obstructive sleep apnea  . PE (pulmonary embolism)   2010 . Peripheral neuropathy  . PTSD (post-traumatic stress disorder)   . Rheumatoid arthritis ([place])  . Schizophrenia ([place])  . Secondary Parkinson disease ([place]) 10/19/2018 . Steroid dependence ([place])  . Type 2 diabetes mellitus ([place])  . Vitamin D deficiency  Past Surgical History: Past Surgical History: Procedure Laterality Date . APPENDECTOMY   . CHOLECYSTECTOMY   . COLONOSCOPY    benign polyps (12/2000), repeat colonoscopy performed in 2007 . ENDOMETRIAL BIOPSY    10/03 benign columnar mucosa (limited material) . INSERTION OF DIALYSIS CATHETER Right 09/14/2019  Procedure: ATTEMPTED INSERTION OF DIALYSIS CATHETER;  Surgeon: Virl Cagey, MD;  Location: [facility];  Service: General;  Laterality: Right; . KIDNEY SURGERY   HPI: 61 with history of remote smoker, COPD, GERD, dysphagia, fibromyalgia, Barretts esophagus. admitted with progressive wt gain, leg swelling sob with w/u c/w anasarca / bilateral effusions. Placement of HD catheter 1/25. Required intubation 1/23-1/30, reintubated 1/31-2/4. MBS 08/17/18 recommending Dys 3 texture, thin liquids with chin tuck. BSE 09/16/19 recommended continue NPO allowing ice chips and MBS planned 2/1 however pt was reintubated 1/31. CXR Moderate central vascular congestion, pulmonary edema and bilateral pleural effusions with overlying atelectasis, right greater than left.  Subjective: pleasant, cooperative Assessment / Plan / Recommendation CHL IP CLINICAL IMPRESSIONS 09/22/2019 Clinical Impression Patient presents with mild oral and a primary sensorimotor based pharyngeal dysphagia. She exhibited deep penetration to vocal cords with first sip of thin liquids and with trace amount of that penetrate resulting in trace aspiration. (View was slightly obscured by her shoulder). Penetration during prior to the swallow was secondary to poor epilgottic inversion leading to poor airway protection. Subsequent swallows of thin liquids, even with straw sips, resulted only in flash penetration. No residuals remained in pharynx. As compared to previous MBS (about a  year ago), patient's swallow is slightly improved.  SLP Visit Diagnosis Dysphagia, oropharyngeal phase (R13.12) Attention and concentration deficit following -- Frontal lobe and executive function deficit following -- Impact on safety and function Mild aspiration risk   CHL IP TREATMENT RECOMMENDATION 09/22/2019 Treatment Recommendations Therapy as outlined in treatment plan below   Prognosis 09/22/2019 Prognosis for Safe Diet Advancement Good Barriers to Reach Goals -- Barriers/Prognosis Comment -- CHL IP DIET RECOMMENDATION 09/22/2019 SLP Diet Recommendations Dysphagia 3 (Mech soft) solids;Thin liquid Liquid Administration via Cup;Straw Medication Administration Whole meds with liquid Compensations Minimize environmental distractions;Slow rate;Small sips/bites Postural Changes Seated upright at 90 degrees   CHL IP OTHER RECOMMENDATIONS 09/22/2019 Recommended Consults -- Oral Care Recommendations Oral care BID Other Recommendations --   CHL IP FOLLOW UP RECOMMENDATIONS 09/22/2019 Follow up Recommendations None   CHL IP FREQUENCY AND DURATION 09/22/2019 Speech Therapy Frequency (ACUTE ONLY) min 1 x/week Treatment Duration 1 week      CHL IP ORAL PHASE 09/22/2019 Oral Phase Impaired Oral -  Pudding Teaspoon -- Oral - Pudding Cup -- Oral - Honey Teaspoon -- Oral - Honey Cup -- Oral - Nectar Teaspoon -- Oral - Nectar Cup -- Oral - Nectar Straw -- Oral - Thin Teaspoon -- Oral - Thin Cup -- Oral - Thin Straw -- Oral - Puree -- Oral - Mech Soft -- Oral - Regular Impaired mastication;Weak lingual manipulation;Delayed oral transit Oral - Multi-Consistency -- Oral - Pill -- Oral Phase - Comment --  CHL IP PHARYNGEAL PHASE 09/22/2019 Pharyngeal Phase Impaired Pharyngeal- Pudding Teaspoon -- Pharyngeal -- Pharyngeal- Pudding Cup -- Pharyngeal -- Pharyngeal- Honey Teaspoon -- Pharyngeal -- Pharyngeal- Honey Cup -- Pharyngeal -- Pharyngeal- Nectar Teaspoon -- Pharyngeal -- Pharyngeal- Nectar Cup -- Pharyngeal -- Pharyngeal- Nectar Straw --  Pharyngeal -- Pharyngeal- Thin Teaspoon -- Pharyngeal -- Pharyngeal- Thin Cup Reduced airway/laryngeal closure;Penetration/Aspiration before swallow;Trace aspiration;Reduced epiglottic inversion Pharyngeal Material enters airway, passes BELOW cords without attempt by patient to eject out (silent aspiration);Material enters airway, remains ABOVE vocal cords and not ejected out;Material enters airway, remains ABOVE vocal cords then ejected out;Material enters airway, CONTACTS cords and then ejected out Pharyngeal- Thin Straw Penetration/Aspiration during swallow;Reduced epiglottic inversion;Reduced airway/laryngeal closure Pharyngeal Material enters airway, remains ABOVE vocal cords then ejected out Pharyngeal- Puree WFL Pharyngeal -- Pharyngeal- Mechanical Soft -- Pharyngeal -- Pharyngeal- Regular WFL Pharyngeal -- Pharyngeal- Multi-consistency -- Pharyngeal -- Pharyngeal- Pill WFL Pharyngeal -- Pharyngeal Comment --  CHL IP CERVICAL ESOPHAGEAL PHASE 09/22/2019 Cervical Esophageal Phase WFL Pudding Teaspoon -- Pudding Cup -- Honey Teaspoon -- Honey Cup -- Nectar Teaspoon -- Nectar Cup -- Nectar Straw -- Thin Teaspoon -- Thin Cup -- Thin Straw -- Puree -- Mechanical Soft -- Regular -- Multi-consistency -- Pill -- Cervical Esophageal Comment -- Dannial Monarch 09/22/2019, 5:22 PM Sonia Baller, MA, CCC-SLP Speech Therapy [facility] Acute Rehab Pager: [phone]               Medications: Infusions: . sodium chloride    . sodium chloride    . sodium chloride    . sodium chloride Stopped (09/21/19 1105)  . sodium chloride    . sodium chloride      Scheduled Medications: . apixaban  2.5 mg Oral BID  . aspirin  81 mg Oral q morning - 10a  . atorvastatin  20 mg Oral q1800  . calcitRIOL  0.25 mcg Oral Q M,W,F  . carvedilol  25 mg Oral BID WC  . Chlorhexidine Gluconate Cloth  6 each Topical Q0600  . cholecalciferol  4,000 Units Oral q morning - 10a  . darbepoetin (ARANESP) injection - NON-DIALYSIS  150 mcg  Subcutaneous Q Thu-1800  . docusate  100 mg Oral BID  . fentaNYL (SUBLIMAZE) injection  25 mcg Intravenous Once  . furosemide  40 mg Oral Daily  . gabapentin  300 mg Oral QHS  . hydrALAZINE  100 mg Oral Q8H  . influenza vaccine adjuvanted  0.5 mL Intramuscular Tomorrow-1000  . insulin aspart  0-9 Units Subcutaneous TID WC  . insulin glargine  8 Units Subcutaneous QHS  . levothyroxine  25 mcg Oral Q0600  . mouth rinse  15 mL Mouth Rinse BID  . montelukast  10 mg Oral QHS  . multivitamin  15 mL Oral Daily  . neomycin-bacitracin-polymyxin  1 application Topical Daily  . predniSONE  10 mg Oral Q breakfast  . primidone  50 mg Oral q morning - 10a  . pyridOXINE  200 mg Oral BID  . risperiDONE  0.5 mg Oral BID  .  sodium chloride flush  3 mL Intravenous Q12H  . traZODone  50 mg Oral Daily    have reviewed scheduled and prn medications.  Physical Exam: General:NAD, comfortable, able to lie flat Heart:RRR, s1s2 nl Lungs: Clear b/l, no crackle Abdomen:soft, Non-tender, non-distended Extremities: Trace LE edema Dialysis Access: Groin temporary catheter intact.  Shaton Lore Prasad Jamarius Saha 09/23/2019,9:51 AM  LOS: 17 days  Pager: [phone]

## 2019-09-23 NOTE — Progress Notes (Signed)
[facility]                                                                              Patient Demographics  [name], is a 69 y.o. female, DOB - 11/06/1950, [id number]  Admit date - 09/06/2019   Admitting Physician Candee Furbish, MD  Outpatient Primary MD for the patient is Glenda Chroman, MD  Outpatient specialists:   LOS - 17  days    Chief Complaint  Patient presents with   Shortness of Breath       Brief summary  70 year old [place] of medical history significant for but not limited to diastolic CHF, chronic kidney disease and COPD who presented with nocturnal shortness of breath and generalized edema and admitted for fluid overload required critical care management of acute hypoxic respiratory failure, vent dependent, as well as acute kidney injury possible transient hemodialysis.  Patient extubated and off hemodialysis currently, making adequate urinary output.  She is transferred to hospitalist service for continued care on the floor  Assessment & Plan    Principal Problem:   Acute kidney injury superimposed on chronic kidney disease ([place]) Active Problems:   Type 2 diabetes mellitus with diabetic polyneuropathy, without long-term current use of insulin (HCC)   Normocytic anemia   Schizophrenia ([place])   Depression   Essential hypertension   Chronic diastolic heart failure (HCC)   CKD (chronic kidney disease), stage IV (HCC)   Obesity, Class III, BMI 40-49.9 (morbid obesity) (HCC)   Type 2 diabetes mellitus (HCC)   Hypothyroidism   Volume overload   On mechanically assisted ventilation (HCC)   Acute metabolic encephalopathy   Acute respiratory failure with hypoxia and hypercapnia (HCC)   SVT (supraventricular tachycardia) (HCC)   Acute pulmonary edema (HCC)  Acute hypoxic and hypercarbic respiratory failure In the setting of volume overload, anarsarca/pleural effusions with acute renal failure Status post 3 intubations-left extubation  09/20/2019 Improved "continue baseline  Acute renal failure status post hemodialysis-off hemodialysis with adequate urine output However creatinine trending upwards with good urine output Monitor renal function electrolytes for renal recovery Nephrology consulted  Anasarca/pleural effusions Diuresis as tolerated  Acute metabolic encephalopathy Secondary to hypercarbia Improving Supportive care  Paroxysmal atrial fibrillation CHADS2VASC score 5 During this admission Apixaban initiated  Hypertension Uncontrolled-hydralazine up titration Monitor hemodynamics with up titration of hypertensive regimen as needed  Moderate protein calorie malnutrition Nutritional support optimization   Code Status: Full code DVT Prophylaxis: On apixaban Family Communication: No family at bedside  Disposition Plan: LTAC-for continued daily physician supervised care on account of recurrent endotracheal intubations due to stridor, multiple medical problems that will need continued monitoring with high risk for decompensation  Time Spent in minutes  25 minutes  Procedures:    Consultants:   Nephrology Pulmonary critical care  Antimicrobials:      Medications  Scheduled Meds:  apixaban  2.5 mg Oral BID   aspirin  81 mg Oral q morning - 10a   atorvastatin  20 mg Oral q1800   calcitRIOL  0.25 mcg Oral Q M,W,F   carvedilol  25 mg Oral BID WC   Chlorhexidine Gluconate Cloth  6 each Topical Q0600   cholecalciferol  4,000 Units Oral q morning - 10a   darbepoetin (ARANESP) injection - NON-DIALYSIS  150 mcg Subcutaneous Q Thu-1800   docusate  100 mg Oral BID   fentaNYL (SUBLIMAZE) injection  25 mcg Intravenous Once   furosemide  40 mg Oral Daily   gabapentin  300 mg Oral QHS   hydrALAZINE  100 mg Oral Q8H   influenza vaccine adjuvanted  0.5 mL Intramuscular Tomorrow-1000   insulin aspart  0-9 Units Subcutaneous TID WC   insulin glargine  8 Units Subcutaneous QHS    levothyroxine  25 mcg Oral Q0600   mouth rinse  15 mL Mouth Rinse BID   montelukast  10 mg Oral QHS   multivitamin  15 mL Oral Daily   neomycin-bacitracin-polymyxin  1 application Topical Daily   predniSONE  10 mg Oral Q breakfast   primidone  50 mg Oral q morning - 10a   pyridOXINE  200 mg Oral BID   risperiDONE  0.5 mg Oral BID   sodium chloride flush  3 mL Intravenous Q12H   traZODone  50 mg Oral Daily   Continuous Infusions:  sodium chloride     sodium chloride     sodium chloride     sodium chloride Stopped (09/21/19 1105)   sodium chloride     sodium chloride     PRN Meds:.sodium chloride, sodium chloride, sodium chloride, sodium chloride, sodium chloride, sodium chloride, acetaminophen **OR** acetaminophen, albuterol, alteplase, haloperidol lactate, heparin, heparin, heparin, hydrOXYzine, labetalol, lidocaine (PF), lidocaine-prilocaine, metoprolol tartrate, ondansetron **OR** ondansetron (ZOFRAN) IV, pentafluoroprop-tetrafluoroeth, polyethylene glycol, sodium chloride flush   Antibiotics   Anti-infectives (From admission, onward)   Start     Dose/Rate Route Frequency Ordered Stop   09/14/19 1500  meropenem (MERREM) 500 mg in sodium chloride 0.9 % 100 mL IVPB  Status:  Discontinued     500 mg 200 mL/hr over 30 Minutes Intravenous Every 24 hours 09/14/19 1345 09/17/19 0821   09/14/19 1400  vancomycin (VANCOREADY) IVPB 2000 mg/400 mL     2,000 mg 200 mL/hr over 120 Minutes Intravenous  Once 09/14/19 1328 09/14/19 1832   09/14/19 1346  vancomycin variable dose per unstable renal function (pharmacist dosing)  Status:  Discontinued      Does not apply See admin instructions 09/14/19 1346 09/16/19 0929   09/14/19 0600  vancomycin (VANCOCIN) IVPB 1000 mg/200 mL premix  Status:  Discontinued     1,000 mg 200 mL/hr over 60 Minutes Intravenous On call to O.R. 09/13/19 1634 09/14/19 1219   09/11/19 1100  levofloxacin (LEVAQUIN) IVPB 500 mg  Status:  Discontinued      500 mg 100 mL/hr over 60 Minutes Intravenous Every 48 hours 09/09/19 1041 09/14/19 1259   09/09/19 1045  levofloxacin (LEVAQUIN) IVPB 750 mg     750 mg 100 mL/hr over 90 Minutes Intravenous  Once 09/09/19 1041 09/09/19 2208        Subjective:   [name] was seen and examined today.   Patient denies dizziness, chest pain, no N/V. No acute events overnight.    Objective:   Vitals:   09/22/19 2057 09/22/19 2300 09/23/19 0445 09/23/19 0900  BP:   (!) 146/65 (!) 149/68  Pulse: 70  68 64  Resp: 15  16 18   Temp:   98.4 F (36.9 C) 98.2 F (36.8 C)  TempSrc:   Oral Oral  SpO2: 96%  96% 98%  Weight:  87.3 kg    Height:  Intake/Output Summary (Last 24 hours) at 09/23/2019 1034 Last data filed at 09/23/2019 0200 Gross per 24 hour  Intake 520 ml  Output 1325 ml  Net -805 ml     Wt Readings from Last 3 Encounters:  09/22/19 87.3 kg  06/06/19 90.3 kg  11/28/18 96.1 kg     Exam  General: NAD  HEENT: NCAT,  PERRL,MMM  Neck: SUPPLE, (-) JVD  Cardiovascular: RRR, (-) GALLOP, (-) MURMUR  Respiratory: Mildly diminished breath sounds at the bases otherwise clear to auscultation  Gastrointestinal: SOFT, (-) DISTENSION, BS(+), (_) TENDERNESS  Ext: (-) CYANOSIS, (+) EDEMA  Neuro: Mild lethargic this morning but arousable     Data Reviewed:  I have personally reviewed following labs and imaging studies  Micro Results Recent Results (from the past 240 hour(s))  Culture, respiratory (non-expectorated)     Status: None   Collection Time: 09/14/19  1:00 PM   Specimen: Tracheal Aspirate; Respiratory  Result Value Ref Range Status   Specimen Description   Final    TRACHEAL ASPIRATE Performed at [facility], [address] [place], [place] [address]    Special Requests   Final    Normal Performed at [facility], [address] [place], [place] [address]    Gram Stain   Final    ABUNDANT WBC PRESENT, PREDOMINANTLY PMN FEW GRAM POSITIVE RODS RARE GRAM  POSITIVE COCCI    Culture   Final    RARE Consistent with normal respiratory flora. Performed at [facility], [place] [address] [place], [place] [address]    Report Status 09/16/2019 FINAL  Final    Radiology Reports CT HEAD WO CONTRAST  Result Date: 09/11/2019 CLINICAL DATA:  Onset left-side neglect today. EXAM: CT HEAD WITHOUT CONTRAST TECHNIQUE: Contiguous axial images were obtained from the base of the skull through the vertex without intravenous contrast. COMPARISON:  Head CT 09/08/2019.  Brain MRI 09/27/2018. FINDINGS: Brain: No evidence of acute infarction, hemorrhage, hydrocephalus, extra-axial collection or mass lesion/mass effect. Vascular: No hyperdense vessel.  Atherosclerosis noted. Skull: Intact.  No focal lesion. Sinuses/Orbits: Small right mastoid effusion is seen. Mucous retention cyst or polyp left maxillary sinus noted. Mild mucosal thickening scattered ethmoid air cells, sphenoid sinuses and right maxillary sinus also noted. Other: None. IMPRESSION: No acute abnormality finding to explain the patient's symptoms. Atherosclerosis. Mild sinus disease. Electronically Signed   By: Inge Rise M.D.   On: 09/11/2019 15:41   CT HEAD WO CONTRAST  Result Date: 09/08/2019 CLINICAL DATA:  Altered mental status. EXAM: CT HEAD WITHOUT CONTRAST TECHNIQUE: Contiguous axial images were obtained from the base of the skull through the vertex without intravenous contrast. COMPARISON:  September 15, 2018 FINDINGS: Brain: There is mild cerebral atrophy with widening of the extra-axial spaces and ventricular dilatation. There are areas of decreased attenuation within the white matter tracts of the supratentorial brain, consistent with microvascular disease changes. A chronic left basal ganglia lacunar infarct is noted. Vascular: No hyperdense vessel or unexpected calcification. Skull: Normal. Negative for fracture or focal lesion. Sinuses/Orbits: A stable 2.0 cm x 1.4 cm polyp versus mucous  retention cyst is seen within the anterior aspect of the left maxillary sinus. Other: None. IMPRESSION: 1. Generalized cerebral atrophy. 2. No acute intracranial abnormality. 3. Chronic left basal ganglia lacunar infarct. 4. Stable left maxillary sinus polyp versus mucous retention cyst. Electronically Signed   By: Virgina Norfolk M.D.   On: 09/08/2019 16:48   CT ANGIO CHEST PE W OR WO CONTRAST  Result Date: 09/08/2019 CLINICAL DATA:  Severe hypoxia. EXAM: CT ANGIOGRAPHY CHEST WITH CONTRAST TECHNIQUE: Multidetector CT imaging of the chest was performed using the standard protocol during bolus administration of intravenous contrast. Multiplanar CT image reconstructions and MIPs were obtained to evaluate the vascular anatomy. CONTRAST:  160mL OMNIPAQUE IOHEXOL 300 MG/ML  SOLN COMPARISON:  Jan 12, 2018 FINDINGS: Cardiovascular: There is mild calcification of the aortic arch. Satisfactory opacification of the pulmonary arteries to the segmental level. No evidence of pulmonary embolism. There is mild, stable cardiomegaly. No pericardial effusion. Marked severity coronary artery calcification is seen. Mediastinum/Nodes: No enlarged mediastinal, hilar, or axillary lymph nodes. Thyroid gland, trachea, and esophagus demonstrate no significant findings. Lungs/Pleura: Endotracheal and nasogastric tubes are present. Moderate severity atelectasis and/or infiltrate is seen within the posterior aspect of the left upper lobe and left lower lobe with marked severity consolidation seen throughout the entire right lower lobe. Moderate size bilateral pleural effusions are seen, right greater than left. No pneumothorax is seen. Upper Abdomen: No acute abnormality. Musculoskeletal: Multilevel degenerative changes seen throughout the thoracic spine Review of the MIP images confirms the above findings. IMPRESSION: 1. No CT evidence of acute pulmonary embolism. 2. Moderate severity left upper lobe and right lower lobe atelectasis  and/or infiltrate with marked severity consolidation seen throughout the entire right lower lobe. 3. Moderate size bilateral pleural effusions, right greater than left. Electronically Signed   By: Virgina Norfolk M.D.   On: 09/08/2019 22:13   US RENAL  Result Date: 09/06/2019 CLINICAL DATA:  Acute kidney injury EXAM: RENAL / URINARY TRACT ULTRASOUND COMPLETE COMPARISON:  CT abdomen pelvis 06/01/2018 FINDINGS: Right Kidney: Renal measurements: 8.2 x 4.5 x 5.3 cm = volume: 102 mL. Poor visualization of RIGHT kidney due to body habitus. Increased cortical echogenicity. Cortical thinning. No gross evidence of mass or hydronephrosis on limited assessment Left Kidney: Renal measurements: 9.4 x 4.5 x 5.3 cm = volume: 130 mL. Cortical thinning with increased cortical echogenicity. No mass or hydronephrosis. Bladder: Well distended, unremarkable.  Patient was unable to void. Other: N/A IMPRESSION: Limited visualization of RIGHT kidney due to body habitus. BILATERAL renal cortical atrophy and medical renal disease changes. No gross evidence of mass or hydronephrosis within limitations of exam. Electronically Signed   By: Lavonia Dana M.D.   On: 09/06/2019 10:47   DG Chest Port 1 View  Result Date: 09/17/2019 CLINICAL DATA:  Respiratory failure. EXAM: PORTABLE CHEST 1 VIEW COMPARISON:  09/16/2019 FINDINGS: The endotracheal tube is 12 mm above the carina. The NG tube is coursing down the esophagus and into the stomach. Moderate central vascular congestion, pulmonary edema and bilateral pleural effusions with overlying atelectasis, right greater than left. IMPRESSION: 1. Stable support apparatus. 2. Moderate central vascular congestion, probable perihilar pulmonary edema and persistent effusions and overlying atelectasis. Electronically Signed   By: Marijo Sanes M.D.   On: 09/17/2019 07:03   DG CHEST PORT 1 VIEW  Result Date: 09/16/2019 CLINICAL DATA:  Oxygen desaturation. EXAM: PORTABLE CHEST 1 VIEW COMPARISON:   September 14, 2019 FINDINGS: The ETT terminates 9 mm above the carina. The NG tube terminates in the right mid abdomen. Small bilateral layering effusions with underlying opacities, likely atelectasis. No pneumothorax. No other interval changes. IMPRESSION: 1. The ETT terminates 9 mm above the carina. Recommend withdrawing 2 cm. The NG tube terminates in either the distal stomach or proximal duodenum. 2. Small bilateral pleural effusions with underlying opacities, probably atelectasis. 3. No other acute abnormalities or changes. These results  will be called to the ordering clinician or representative by the Radiologist Assistant, and communication documented in the PACS or zVision Dashboard. Electronically Signed   By: Dorise Bullion III M.D   On: 09/16/2019 16:47   DG Chest Port 1 View  Result Date: 09/14/2019 CLINICAL DATA:  Attempted placement of dialysis catheter. EXAM: PORTABLE CHEST 1 VIEW COMPARISON:  One-view chest x-ray 09/13/2019 FINDINGS: The heart is enlarged. Patient remains intubated. NG tube terminates in the stomach. Right pleural effusion is noted within the right minor fissure and at the apex, likely redistributed due to positioning. Increased right upper lobe airspace disease is present. The right base is clear. The left lung is mostly clear. IMPRESSION: 1. Increased right upper lobe airspace disease concerning for pneumonia. 2. Small right pleural effusion. 3. Cardiomegaly without failure. Electronically Signed   By: San Morelle M.D.   On: 09/14/2019 12:33   DG Chest Port 1 View  Result Date: 09/13/2019 CLINICAL DATA:  Respiratory failure with hypoxemia. EXAM: PORTABLE CHEST 1 VIEW COMPARISON:  Chest radiograph 09/10/2019, chest CT 09/08/2019 FINDINGS: ET tube is unchanged position with tip terminating at the level of the clavicular heads. An enteric tube passes below level of left hemidiaphragm with tip excluded from the field of view. Overlying cardiac monitoring leads.  Unchanged cardiomediastinal silhouette with cardiomegaly. Right greater than left pleural effusions and bibasilar atelectasis, similar to prior examination. No evidence of pneumothorax. IMPRESSION: Support apparatus as described. Similar appearance of right greater than left pleural effusions and bibasilar atelectasis. Pneumonia at the lung bases cannot be excluded. Unchanged cardiomegaly. Electronically Signed   By: Kellie Simmering DO   On: 09/13/2019 07:13   DG CHEST PORT 1 VIEW  Result Date: 09/10/2019 CLINICAL DATA:  Acute respiratory failure with hypoxia EXAM: PORTABLE CHEST 1 VIEW COMPARISON:  Yesterday FINDINGS: Small pleural effusions with superimposed lower lobe atelectasis by most recent chest CT. Cardiomegaly. Endotracheal tube tip is at the clavicular heads, slightly higher than before. The enteric tube at least reaches the stomach. IMPRESSION: Continued low volume chest with layering effusions and lower lobe atelectasis. Electronically Signed   By: Monte Fantasia M.D.   On: 09/10/2019 05:45   Portable Chest xray  Result Date: 09/09/2019 CLINICAL DATA:  Respiratory failure EXAM: PORTABLE CHEST 1 VIEW COMPARISON:  CTA chest dated 09/08/2019 FINDINGS: Endotracheal tube terminates 2.5 cm above the carina. Enteric tube courses into the stomach. Moderate layering right pleural effusion. Associated right lower lobe opacity, likely atelectasis. Small left pleural effusion. Pulmonary vascular congestion without frank interstitial edema. Cardiomegaly. IMPRESSION: Endotracheal tube terminates 2.5 cm above the carina. Cardiomegaly with pulmonary vascular congestion. No frank interstitial edema. Moderate right and small left pleural effusions. Associated right lower lobe opacity, likely atelectasis. Electronically Signed   By: Julian Hy M.D.   On: 09/09/2019 07:10   DG CHEST PORT 1 VIEW  Result Date: 09/08/2019 CLINICAL DATA:  Endotracheal tube placement EXAM: PORTABLE CHEST 1 VIEW COMPARISON:   09/18/2018.  09/08/2019 FINDINGS: The heart size remains enlarged. Endotracheal tube terminates approximately 3.7 cm above the carina. The enteric tube extends below the left hemidiaphragm. Moderate to large bilateral pleural effusions are noted. Bibasilar airspace disease is noted which is favored to represent atelectasis. There is no pneumothorax. Vascular congestion is noted. IMPRESSION: 1. Lines and tubes as above. 2. Persistent findings of congestive heart failure with moderate to large bilateral pleural effusions. Electronically Signed   By: Constance Holster M.D.   On: 09/08/2019 17:23   DG CHEST  PORT 1 VIEW  Result Date: 09/08/2019 CLINICAL DATA:  Status post intubation. EXAM: PORTABLE CHEST 1 VIEW COMPARISON:  September 06, 2019. FINDINGS: An endotracheal tube is seen with its distal tip approximately 1.7 cm from the carina. This represents a new finding when compared to the prior study. A nasogastric tube is seen with its distal end extending below the level of the diaphragm. This also represents a new finding. There is no evidence of focal consolidation, pleural effusion or pneumothorax. The cardiac silhouette is moderately enlarged. Multilevel degenerative changes seen throughout the thoracic spine. IMPRESSION: 1. Interval endotracheal tube and nasogastric tube placement and positioning, as described above, when compared to the prior study dated September 06, 2019. Electronically Signed   By: Virgina Norfolk M.D.   On: 09/08/2019 16:24   DG Chest Port 1 View  Result Date: 09/06/2019 CLINICAL DATA:  Shortness of breath. EXAM: PORTABLE CHEST 1 VIEW COMPARISON:  One-view chest x-ray 08/29/2018 FINDINGS: Heart is enlarged. Mild interstitial opacities slightly more prominent than on the prior study. Heart size and interstitial pattern is exaggerated by low lung volumes. No significant airspace consolidation is present. No significant effusions are present. IMPRESSION: 1. Cardiomegaly with mild  interstitial edema suggesting congestive heart failure. 2. Low lung volumes. Electronically Signed   By: San Morelle M.D.   On: 09/06/2019 04:29   DG Swallowing Func-Speech Pathology  Result Date: 09/22/2019 Objective Swallowing Evaluation: Type of Study: MBS-Modified Barium Swallow Study  Patient Details Name: Donald Curtiss MRN: [id number] Date of Birth: 1951-07-22 Today's Date: 09/22/2019 Time: SLP Start Time (ACUTE ONLY): [id number] -SLP Stop Time (ACUTE ONLY): [id number] SLP Time Calculation (min) (ACUTE ONLY): 10 min Past Medical History: Past Medical History: Diagnosis Date  Anxiety   Asthma   Back pain   Barrett esophagus   CHF (congestive heart failure) (HCC)   CKD (chronic kidney disease) stage 3, GFR 30-59 ml/min   COPD (chronic obstructive pulmonary disease) (HCC)   Depression   Diastolic heart failure ([place])   Essential hypertension   Fibromyalgia   GERD (gastroesophageal reflux disease)   Gout   History of pericarditis   Hyperlipidemia   Hypothyroid   Major depressive disorder   Memory loss   Nephrolithiasis   2008  Obesity hypoventilation syndrome (HCC)   CPAP  Obstructive sleep apnea   PE (pulmonary embolism)   2010  Peripheral neuropathy   PTSD (post-traumatic stress disorder)   Rheumatoid arthritis (HCC)   Schizophrenia (HCC)   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  Steroid dependence ([place])   Type 2 diabetes mellitus ([place])   Vitamin D deficiency  Past Surgical History: Past Surgical History: Procedure Laterality Date  APPENDECTOMY    CHOLECYSTECTOMY    COLONOSCOPY    benign polyps (12/2000), repeat colonoscopy performed in 2007  ENDOMETRIAL BIOPSY    10/03 benign columnar mucosa (limited material)  INSERTION OF DIALYSIS CATHETER Right 09/14/2019  Procedure: ATTEMPTED INSERTION OF DIALYSIS CATHETER;  Surgeon: Virl Cagey, MD;  Location: [facility];  Service: General;  Laterality: Right;  KIDNEY SURGERY   HPI: 59 with history of remote smoker, COPD, GERD,  dysphagia, fibromyalgia, Barretts esophagus. admitted with progressive wt gain, leg swelling sob with w/u c/w anasarca / bilateral effusions. Placement of HD catheter 1/25. Required intubation 1/23-1/30, reintubated 1/31-2/4. MBS 08/17/18 recommending Dys 3 texture, thin liquids with chin tuck. BSE 09/16/19 recommended continue NPO allowing ice chips and MBS planned 2/1 however pt was reintubated 1/31. CXR Moderate central vascular congestion, pulmonary edema and bilateral pleural  effusions with overlying atelectasis, right greater than left.  Subjective: pleasant, cooperative Assessment / Plan / Recommendation CHL IP CLINICAL IMPRESSIONS 09/22/2019 Clinical Impression Patient presents with mild oral and a primary sensorimotor based pharyngeal dysphagia. She exhibited deep penetration to vocal cords with first sip of thin liquids and with trace amount of that penetrate resulting in trace aspiration. (View was slightly obscured by her shoulder). Penetration during prior to the swallow was secondary to poor epilgottic inversion leading to poor airway protection. Subsequent swallows of thin liquids, even with straw sips, resulted only in flash penetration. No residuals remained in pharynx. As compared to previous MBS (about a year ago), patient's swallow is slightly improved.  SLP Visit Diagnosis Dysphagia, oropharyngeal phase (R13.12) Attention and concentration deficit following -- Frontal lobe and executive function deficit following -- Impact on safety and function Mild aspiration risk   CHL IP TREATMENT RECOMMENDATION 09/22/2019 Treatment Recommendations Therapy as outlined in treatment plan below   Prognosis 09/22/2019 Prognosis for Safe Diet Advancement Good Barriers to Reach Goals -- Barriers/Prognosis Comment -- CHL IP DIET RECOMMENDATION 09/22/2019 SLP Diet Recommendations Dysphagia 3 (Mech soft) solids;Thin liquid Liquid Administration via Cup;Straw Medication Administration Whole meds with liquid Compensations  Minimize environmental distractions;Slow rate;Small sips/bites Postural Changes Seated upright at 90 degrees   CHL IP OTHER RECOMMENDATIONS 09/22/2019 Recommended Consults -- Oral Care Recommendations Oral care BID Other Recommendations --   CHL IP FOLLOW UP RECOMMENDATIONS 09/22/2019 Follow up Recommendations None   CHL IP FREQUENCY AND DURATION 09/22/2019 Speech Therapy Frequency (ACUTE ONLY) min 1 x/week Treatment Duration 1 week      CHL IP ORAL PHASE 09/22/2019 Oral Phase Impaired Oral - Pudding Teaspoon -- Oral - Pudding Cup -- Oral - Honey Teaspoon -- Oral - Honey Cup -- Oral - Nectar Teaspoon -- Oral - Nectar Cup -- Oral - Nectar Straw -- Oral - Thin Teaspoon -- Oral - Thin Cup -- Oral - Thin Straw -- Oral - Puree -- Oral - Mech Soft -- Oral - Regular Impaired mastication;Weak lingual manipulation;Delayed oral transit Oral - Multi-Consistency -- Oral - Pill -- Oral Phase - Comment --  CHL IP PHARYNGEAL PHASE 09/22/2019 Pharyngeal Phase Impaired Pharyngeal- Pudding Teaspoon -- Pharyngeal -- Pharyngeal- Pudding Cup -- Pharyngeal -- Pharyngeal- Honey Teaspoon -- Pharyngeal -- Pharyngeal- Honey Cup -- Pharyngeal -- Pharyngeal- Nectar Teaspoon -- Pharyngeal -- Pharyngeal- Nectar Cup -- Pharyngeal -- Pharyngeal- Nectar Straw -- Pharyngeal -- Pharyngeal- Thin Teaspoon -- Pharyngeal -- Pharyngeal- Thin Cup Reduced airway/laryngeal closure;Penetration/Aspiration before swallow;Trace aspiration;Reduced epiglottic inversion Pharyngeal Material enters airway, passes BELOW cords without attempt by patient to eject out (silent aspiration);Material enters airway, remains ABOVE vocal cords and not ejected out;Material enters airway, remains ABOVE vocal cords then ejected out;Material enters airway, CONTACTS cords and then ejected out Pharyngeal- Thin Straw Penetration/Aspiration during swallow;Reduced epiglottic inversion;Reduced airway/laryngeal closure Pharyngeal Material enters airway, remains ABOVE vocal cords then ejected out  Pharyngeal- Puree WFL Pharyngeal -- Pharyngeal- Mechanical Soft -- Pharyngeal -- Pharyngeal- Regular WFL Pharyngeal -- Pharyngeal- Multi-consistency -- Pharyngeal -- Pharyngeal- Pill WFL Pharyngeal -- Pharyngeal Comment --  CHL IP CERVICAL ESOPHAGEAL PHASE 09/22/2019 Cervical Esophageal Phase WFL Pudding Teaspoon -- Pudding Cup -- Honey Teaspoon -- Honey Cup -- Nectar Teaspoon -- Nectar Cup -- Nectar Straw -- Thin Teaspoon -- Thin Cup -- Thin Straw -- Puree -- Mechanical Soft -- Regular -- Multi-consistency -- Pill -- Cervical Esophageal Comment -- Dannial Monarch 09/22/2019, 5:22 PM Sonia Baller, MA, CCC-SLP Speech Therapy [facility] Pager: [phone]  DG C-Arm 1-60 Min-No Report  Result Date: 09/14/2019 Fluoroscopy was utilized by the requesting physician.  No radiographic interpretation.   VAS [place] UPPER EXT VEIN MAPPING (PRE-OP AVF)  Result Date: 09/17/2019 UPPER EXTREMITY VEIN MAPPING  Indications: Pre-access. Comparison Study: no prior Performing Technologist: Abram Sander RVS  Examination Guidelines: A complete evaluation includes B-mode imaging, spectral Doppler, color Doppler, and power Doppler as needed of all accessible portions of each vessel. Bilateral testing is considered an integral part of a complete examination. Limited examinations for reoccurring indications may be performed as noted. +-----------------+-------------+----------+--------------+  Right Cephalic    Diameter (cm) Depth (cm)    Findings     +-----------------+-------------+----------+--------------+  Shoulder              0.28         1.44                    +-----------------+-------------+----------+--------------+  Prox upper arm        0.31         1.32                    +-----------------+-------------+----------+--------------+  Mid upper arm         0.47         0.91                    +-----------------+-------------+----------+--------------+  Dist upper arm        0.32         0.42                     +-----------------+-------------+----------+--------------+  Antecubital fossa     0.44         0.32                    +-----------------+-------------+----------+--------------+  Prox forearm          0.32         0.59                    +-----------------+-------------+----------+--------------+  Mid forearm           0.35         0.52      branching     +-----------------+-------------+----------+--------------+  Dist forearm                               not visualized  +-----------------+-------------+----------+--------------+ +-----------------+-------------+----------+--------+  Right Basilic     Diameter (cm) Depth (cm) Findings  +-----------------+-------------+----------+--------+  Prox upper arm        0.54         1.24              +-----------------+-------------+----------+--------+  Mid upper arm         0.43         0.82              +-----------------+-------------+----------+--------+  Dist upper arm        0.38         0.48              +-----------------+-------------+----------+--------+  Antecubital fossa     0.43         0.48              +-----------------+-------------+----------+--------+  Prox forearm  0.40         0.67              +-----------------+-------------+----------+--------+  Mid forearm           0.38         0.75              +-----------------+-------------+----------+--------+  Distal forearm        0.35         0.59              +-----------------+-------------+----------+--------+ +-----------------+-------------+----------+------------------------------+  Left Cephalic     Diameter (cm) Depth (cm)            Findings             +-----------------+-------------+----------+------------------------------+  Shoulder              0.29         1.07                                    +-----------------+-------------+----------+------------------------------+  Prox upper arm        0.26         0.72                                     +-----------------+-------------+----------+------------------------------+  Mid upper arm                              not visualized due to bandages  +-----------------+-------------+----------+------------------------------+  Dist upper arm                             not visualized due to bandages  +-----------------+-------------+----------+------------------------------+  Antecubital fossa     0.38         0.33                                    +-----------------+-------------+----------+------------------------------+  Prox forearm          0.32         0.47                                    +-----------------+-------------+----------+------------------------------+  Mid forearm                                        not visualized          +-----------------+-------------+----------+------------------------------+  Dist forearm                                       not visualized          +-----------------+-------------+----------+------------------------------+ +-----------------+-------------+----------+------------------------------+  Left Basilic      Diameter (cm) Depth (cm)            Findings             +-----------------+-------------+----------+------------------------------+  Prox upper arm        0.43  0.76                                    +-----------------+-------------+----------+------------------------------+  Mid upper arm         0.44         1.06                                    +-----------------+-------------+----------+------------------------------+  Dist upper arm                             not visualized due to bandages  +-----------------+-------------+----------+------------------------------+  Antecubital fossa     0.36         1.44                                    +-----------------+-------------+----------+------------------------------+  Prox forearm          0.25         0.81                                     +-----------------+-------------+----------+------------------------------+  Mid forearm           0.27         0.55                                    +-----------------+-------------+----------+------------------------------+  Distal forearm        0.24         0.52                                    +-----------------+-------------+----------+------------------------------+ *See table(s) above for measurements and observations.  Diagnosing physician: Ruta Hinds MD Electronically signed by Ruta Hinds MD on 09/17/2019 at 5:01:41 PM.    Final     Lab Data:  CBC: Recent Labs  Lab 09/17/19 0320 09/17/19 0409 09/21/19 1409  WBC  --  9.7 10.7*  HGB 10.2* 8.3* 9.6*  HCT 30.0* 25.4* 29.4*  MCV  --  97.7 98.7  PLT  --  253 0000000   Basic Metabolic Panel: Recent Labs  Lab 09/17/19 0409 09/18/19 0314 09/19/19 0404 09/20/19 0231 09/21/19 0252 09/22/19 0417 09/23/19 0941  NA 143   < > 142 141 143 142 142  K 3.2*   < > 4.0 3.3* 3.6 3.5 3.6  CL 103   < > 106 104 104 106 106  CO2 23   < > 26 26 26 27 25   GLUCOSE 188*   < > 189* 237* 89 156* 212*  BUN 74*   < > 74* 60* 74* 73* 75*  CREATININE 5.50*   < > 3.85* 3.30* 3.86* 4.59* 5.15*  CALCIUM 8.8*   < > 8.5* 8.7* 8.8* 8.8* 8.4*  MG 2.4  --   --   --   --   --   --   PHOS 6.7*   < > 2.8 3.0 2.8 4.3 4.4   < > = values in this interval not  displayed.   GFR: Estimated Creatinine Clearance: 10.9 mL/min (A) (by C-G formula based on SCr of 5.15 mg/dL (H)). Liver Function Tests: Recent Labs  Lab 09/19/19 0404 09/20/19 0231 09/21/19 0252 09/22/19 0417 09/23/19 0941  ALBUMIN 1.9* 2.1* 2.0* 2.1* 2.0*   No results for input(s): LIPASE, AMYLASE in the last 168 hours. No results for input(s): AMMONIA in the last 168 hours. Coagulation Profile: No results for input(s): INR, PROTIME in the last 168 hours. Cardiac Enzymes: No results for input(s): CKTOTAL, CKMB, CKMBINDEX, TROPONINI in the last 168 hours. BNP (last 3 results) No results  for input(s): PROBNP in the last 8760 hours. HbA1C: No results for input(s): HGBA1C in the last 72 hours. CBG: Recent Labs  Lab 09/22/19 0653 09/22/19 1108 09/22/19 1620 09/22/19 2024 09/23/19 0648  GLUCAP 140* 149* 300* 301* 172*   Lipid Profile: No results for input(s): CHOL, HDL, LDLCALC, TRIG, CHOLHDL, LDLDIRECT in the last 72 hours. Thyroid Function Tests: No results for input(s): TSH, T4TOTAL, FREET4, T3FREE, THYROIDAB in the last 72 hours. Anemia Panel: No results for input(s): VITAMINB12, FOLATE, FERRITIN, TIBC, IRON, RETICCTPCT in the last 72 hours. Urine analysis:    Component Value Date/Time   COLORURINE YELLOW 09/08/2019 1231   APPEARANCEUR CLEAR 09/08/2019 1231   LABSPEC 1.010 09/08/2019 1231   PHURINE 6.0 09/08/2019 1231   GLUCOSEU 150 (A) 09/08/2019 1231   HGBUR NEGATIVE 09/08/2019 1231   HGBUR negative 10/20/2009 1425   BILIRUBINUR NEGATIVE 09/08/2019 1231   KETONESUR NEGATIVE 09/08/2019 1231   PROTEINUR >=300 (A) 09/08/2019 1231   UROBILINOGEN 0.2 10/05/2011 1002   NITRITE NEGATIVE 09/08/2019 1231   LEUKOCYTESUR SMALL (A) 09/08/2019 1231     Benito Mccreedy M.D. [facility] 09/23/2019, 10:34 AM  Pager: [phone] Between 7am to 7pm - call Pager - [phone]  After 7pm go to [email or web address] - password [email or web address]  Call night coverage person covering after 7pm

## 2019-09-24 DIAGNOSIS — E8779 Other fluid overload: Secondary | ICD-10-CM | POA: Diagnosis not present

## 2019-09-24 DIAGNOSIS — N179 Acute kidney failure, unspecified: Secondary | ICD-10-CM | POA: Diagnosis not present

## 2019-09-24 DIAGNOSIS — R0602 Shortness of breath: Secondary | ICD-10-CM | POA: Diagnosis not present

## 2019-09-24 DIAGNOSIS — N189 Chronic kidney disease, unspecified: Secondary | ICD-10-CM | POA: Diagnosis not present

## 2019-09-24 LAB — RENAL FUNCTION PANEL
Albumin: 2 g/dL — ABNORMAL LOW (ref 3.5–5.0)
Anion gap: 13 (ref 5–15)
BUN: 76 mg/dL — ABNORMAL HIGH (ref 8–23)
CO2: 25 mmol/L (ref 22–32)
Calcium: 8.5 mg/dL — ABNORMAL LOW (ref 8.9–10.3)
Chloride: 100 mmol/L (ref 98–111)
Creatinine, Ser: 5.43 mg/dL — ABNORMAL HIGH (ref 0.44–1.00)
GFR calc Af Amer: 9 mL/min — ABNORMAL LOW (ref >60)
GFR calc non Af Amer: 8 mL/min — ABNORMAL LOW (ref >60)
Glucose, Bld: 146 mg/dL — ABNORMAL HIGH (ref 70–99)
Phosphorus: 4.5 mg/dL (ref 2.5–4.6)
Potassium: 4.1 mmol/L (ref 3.5–5.1)
Sodium: 138 mmol/L (ref 135–145)

## 2019-09-24 LAB — CBC
HCT: 25.8 % — ABNORMAL LOW (ref 36.0–46.0)
Hemoglobin: 8.4 g/dL — ABNORMAL LOW (ref 12.0–15.0)
MCH: 32.6 pg (ref 26.0–34.0)
MCHC: 32.6 g/dL (ref 30.0–36.0)
MCV: 100 fL (ref 80.0–100.0)
Platelets: 242 10*3/uL (ref 150–400)
RBC: 2.58 MIL/uL — ABNORMAL LOW (ref 3.87–5.11)
RDW: 14.5 % (ref 11.5–15.5)
WBC: 9.9 10*3/uL (ref 4.0–10.5)
nRBC: 0 % (ref 0.0–0.2)

## 2019-09-24 LAB — GLUCOSE, CAPILLARY
Glucose-Capillary: 110 mg/dL — ABNORMAL HIGH (ref 70–99)
Glucose-Capillary: 281 mg/dL — ABNORMAL HIGH (ref 70–99)
Glucose-Capillary: 304 mg/dL — ABNORMAL HIGH (ref 70–99)
Glucose-Capillary: 286 mg/dL — ABNORMAL HIGH (ref 70–99)

## 2019-09-24 MED ORDER — PANTOPRAZOLE SODIUM 40 MG PO TBEC
40.0000 mg | DELAYED_RELEASE_TABLET | Freq: Every day | ORAL | Status: DC
  Administered 2019-09-24 – 2019-10-01 (×8): 40 mg via ORAL
  Filled 2019-09-24 (×8): qty 1

## 2019-09-24 MED ORDER — NEPRO/CARBSTEADY PO LIQD
237.0000 mL | Freq: Two times a day (BID) | ORAL | Status: DC
  Administered 2019-09-25 – 2019-10-01 (×10): 237 mL via ORAL

## 2019-09-24 MED ORDER — CALCITRIOL 0.25 MCG PO CAPS
0.2500 ug | ORAL_CAPSULE | ORAL | Status: DC
  Administered 2019-09-24 – 2019-10-01 (×4): 0.25 ug via ORAL
  Filled 2019-09-24 (×4): qty 1

## 2019-09-24 NOTE — NC FL2 (Signed)
[place] LEVEL OF CARE SCREENING TOOL     IDENTIFICATION  Patient Name: [name] Birthdate: 11/15/1950 Sex: female Admission Date (Current Location): 09/06/2019  [facility] and [place] Number:  Herbalist and Address:  [facility] [place], [place] [address], [place], [place] [address]      Provider Number: [id number]  Attending Physician Name and Address:  Shelly Coss, MD  Relative Name and Phone Number:       Current Level of Care: Hospital Recommended Level of Care: [place] Prior Approval Number:    Date Approved/Denied:   PASRR Number:  pending  Discharge Plan: SNF    Current Diagnoses: Patient Active Problem List   Diagnosis Date Noted  . Acute pulmonary edema (HCC)   . Acute respiratory failure with hypoxia and hypercapnia ([place]) 09/10/2019  . SVT (supraventricular tachycardia) ([place]) 09/10/2019  . On mechanically assisted ventilation ([place]) 09/08/2019  . Acute metabolic encephalopathy [id number]  . Volume overload 09/06/2019  . AKI (acute kidney injury) ([place]) 09/06/2019  . Altered mental state 11/28/2018  . Near syncope 11/28/2018  . Fall at home 11/28/2018  . History of seizure 11/28/2018  . Secondary Parkinson disease ([place]) 10/19/2018  . Pressure injury of skin 09/17/2018  . Respiratory failure ([place]) 08/16/2018  . Seizure ([place]) 08/16/2018  . Acute encephalopathy 07/27/2018  . Endotracheally intubated 07/27/2018  . Hypothyroidism 07/27/2018  . Pleuritic chest pain 07/14/2018  . Moderate persistent asthma without complication   . Type 2 diabetes mellitus ([place]) 04/19/2018  . Acute kidney injury superimposed on chronic kidney disease ([place]) 01/13/2018  . Altered mental status   . Encephalopathy 10/11/2016  . Chest pain 08/12/2016  . CKD (chronic kidney disease), stage IV ([place]) 08/12/2016  . History of pulmonary embolus (PE) 08/12/2016  . Obesity, Class III, BMI 40-49.9 (morbid obesity) ([place])  08/12/2016  . RBBB 08/12/2016  . Positive D dimer 08/12/2016  . Cerebral infarction ([place]) 11/10/2015  . Physical deconditioning 07/01/2011  . Weakness 07/01/2011  . Diabetic foot ulcer ([place]) 05/19/2011  . Polypharmacy 02/09/2011  . BACK PAIN WITH RADICULOPATHY 05/25/2010  . Type 2 diabetes mellitus with diabetic polyneuropathy, without long-term current use of insulin ([place]) 08/18/2009  . Chronic diastolic heart failure ([place]) 04/25/2009  . Normocytic anemia 04/14/2009  . Sleep apnea 12/11/2008  . Diabetic peripheral neuropathy ([place]) 05/04/2007  . HYPERCHOLESTEROLEMIA 10/13/2006  . Gout, unspecified 10/13/2006  . HYPOKALEMIA 10/13/2006  . Schizophrenia ([place]) 10/13/2006  . Depression 10/13/2006  . Essential hypertension 10/13/2006  . Venous (peripheral) insufficiency 10/13/2006  . RHINITIS, ALLERGIC 10/13/2006  . Asthma 10/13/2006  . Reflux esophagitis 10/13/2006  . OSTEOARTHRITIS, MULTI SITES 10/13/2006  . INCONTINENCE, URGE 10/13/2006    Orientation RESPIRATION BLADDER Height & Weight     Self, Place    Incontinent, Indwelling catheter Weight: 192 lb 7.4 oz (87.3 kg) Height:  5\' 2"  (157.5 cm)  BEHAVIORAL SYMPTOMS/MOOD NEUROLOGICAL BOWEL NUTRITION STATUS      Incontinent Diet(see discharge summary)  AMBULATORY STATUS COMMUNICATION OF NEEDS Skin   Extensive Assist Verbally Skin abrasions, Other (Comment)(blister left lower leg; dermatitis; generalized ecchymosis)                       Personal Care Assistance Level of Assistance  Bathing, Dressing, Feeding Bathing Assistance: Maximum assistance Feeding assistance: Limited assistance Dressing Assistance: Maximum assistance     Functional Limitations Info  Hearing, Sight, Speech Sight Info: Adequate Hearing Info: Impaired Speech Info: Adequate  SPECIAL CARE FACTORS FREQUENCY  OT (By licensed OT), PT (By licensed PT)     PT Frequency: 5x week OT Frequency: 5x week            Contractures Contractures  Info: Not present    Additional Factors Info  Code Status, Allergies, Insulin Sliding Scale, Psychotropic Code Status Info: Full Code Allergies Info: Benztropine Mesylate, Codeine, Diazepam, Diphenhydramine Hcl, Penicillins, Sulfonamide Derivatives, Thioridazine Hcl, Lisinopril Psychotropic Info: traZODone (DESYREL) tablet 50 mg daily PO; risperiDONE (RISPERDAL) tablet 0.5 mg 2x daily PO Insulin Sliding Scale Info: insulin aspart (novoLOG) injection 0-9 Units 3x day with meals; insulin glargine (LANTUS) injection 12 Units       Current Medications (09/24/2019):  This is the current hospital active medication list Current Facility-Administered Medications  Medication Dose Route Frequency Provider Last Rate Last Admin  . 0.9 %  sodium chloride infusion  250 mL Intravenous PRN Spero Geralds, MD   New Bag at 09/14/19 1044  . 0.9 %  sodium chloride infusion  100 mL Intravenous PRN Spero Geralds, MD      . 0.9 %  sodium chloride infusion  100 mL Intravenous PRN Spero Geralds, MD      . 0.9 %  sodium chloride infusion   Intravenous PRN Spero Geralds, MD   Stopped at 09/21/19 1105  . 0.9 %  sodium chloride infusion  100 mL Intravenous PRN Spero Geralds, MD      . 0.9 %  sodium chloride infusion  100 mL Intravenous PRN Spero Geralds, MD      . acetaminophen (TYLENOL) tablet 650 mg  650 mg Oral Q6H PRN Spero Geralds, MD   650 mg at 09/24/19 [id number]   Or  . acetaminophen (TYLENOL) suppository 650 mg  650 mg Rectal Q6H PRN Spero Geralds, MD      . albuterol (PROVENTIL) (2.5 MG/3ML) 0.083% nebulizer solution 2.5 mg  2.5 mg Nebulization Q2H PRN Spero Geralds, MD   2.5 mg at 09/10/19 0427  . alteplase (CATHFLO ACTIVASE) injection 2 mg  2 mg Intracatheter Once PRN Spero Geralds, MD      . alum & mag hydroxide-simeth (MAALOX/MYLANTA) 200-200-20 MG/5ML suspension 30 mL  30 mL Oral Q6H PRN Benito Mccreedy, MD   30 mL at 09/24/19 0852  . apixaban (ELIQUIS) tablet 2.5 mg  2.5 mg Oral BID  Spero Geralds, MD   2.5 mg at 09/24/19 0843  . aspirin chewable tablet 81 mg  81 mg Oral q morning - 10a Spero Geralds, MD   81 mg at 09/24/19 0843  . atorvastatin (LIPITOR) tablet 20 mg  20 mg Oral q1800 Spero Geralds, MD   20 mg at 09/23/19 1704  . calcitRIOL (ROCALTROL) capsule 0.25 mcg  0.25 mcg Oral Q M,W,F Osei-Bonsu, Iona Beard, MD   0.25 mcg at 09/24/19 0844  . carvedilol (COREG) tablet 25 mg  25 mg Oral BID WC Spero Geralds, MD   25 mg at 09/24/19 0843  . Chlorhexidine Gluconate Cloth 2 % PADS 6 each  6 each Topical Q0600 Spero Geralds, MD   6 each at 09/24/19 [phone]  . cholecalciferol (VITAMIN D3) tablet 4,000 Units  4,000 Units Oral q morning - 10a Spero Geralds, MD   4,000 Units at 09/24/19 [phone]  . Darbepoetin Alfa (ARANESP) injection 150 mcg  150 mcg Subcutaneous Q Thu-1800 Spero Geralds, MD   150 mcg at 09/20/19 1706  . docusate (  COLACE) 50 MG/5ML liquid 100 mg  100 mg Oral BID Spero Geralds, MD   100 mg at 09/24/19 0847  . fentaNYL (SUBLIMAZE) injection 25 mcg  25 mcg Intravenous Once Spero Geralds, MD      . furosemide (LASIX) tablet 40 mg  40 mg Oral Daily Rosita Fire, MD   40 mg at 09/24/19 0843  . gabapentin (NEURONTIN) capsule 100 mg  100 mg Oral BID Osei-Bonsu, Iona Beard, MD   100 mg at 09/24/19 0844  . haloperidol lactate (HALDOL) injection 5 mg  5 mg Intravenous Q6H PRN Spero Geralds, MD      . heparin injection 1,000 Units  1,000 Units Dialysis PRN Spero Geralds, MD      . heparin injection 2,000 Units  20 Units/kg Dialysis PRN Spero Geralds, MD      . heparin injection 3,100 Units  3,100 Units Dialysis PRN Spero Geralds, MD   3,100 Units at 09/19/19 0957  . hydrALAZINE (APRESOLINE) tablet 100 mg  100 mg Oral Q8H Rosita Fire, MD   100 mg at 09/24/19 1218  . hydrOXYzine (ATARAX/VISTARIL) tablet 25 mg  25 mg Oral TID PRN Spero Geralds, MD   25 mg at 09/22/19 0820  . influenza vaccine adjuvanted (FLUAD) injection 0.5 mL  0.5 mL  Intramuscular Tomorrow-1000 Emokpae, Courage, MD      . insulin aspart (novoLOG) injection 0-9 Units  0-9 Units Subcutaneous TID WC Spero Geralds, MD   5 Units at 09/24/19 1217  . insulin glargine (LANTUS) injection 12 Units  12 Units Subcutaneous QHS Osei-Bonsu, Iona Beard, MD   12 Units at 09/23/19 2205  . labetalol (NORMODYNE) injection 10 mg  10 mg Intravenous Q4H PRN Spero Geralds, MD   10 mg at 09/21/19 [id number]  . levothyroxine (SYNTHROID) tablet 25 mcg  25 mcg Oral Q0600 Spero Geralds, MD   25 mcg at 09/24/19 [id number]  . lidocaine (PF) (XYLOCAINE) 1 % injection 5 mL  5 mL Intradermal PRN Spero Geralds, MD      . lidocaine-prilocaine (EMLA) cream 1 application  1 application Topical PRN Spero Geralds, MD      . MEDLINE mouth rinse  15 mL Mouth Rinse BID Spero Geralds, MD   15 mL at 09/24/19 0856  . metoprolol tartrate (LOPRESSOR) injection 5 mg  5 mg Intravenous Q4H PRN Spero Geralds, MD   5 mg at 09/17/19 1707  . montelukast (SINGULAIR) tablet 10 mg  10 mg Oral QHS Spero Geralds, MD   10 mg at 09/23/19 2151  . multivitamin liquid 15 mL  15 mL Oral Daily Spero Geralds, MD   15 mL at 09/24/19 0846  . neomycin-bacitracin-polymyxin (NEOSPORIN) ointment 1 application  1 application Topical Daily Spero Geralds, MD   1 application at [id number]  . ondansetron (ZOFRAN) tablet 4 mg  4 mg Oral Q6H PRN Spero Geralds, MD       Or  . ondansetron [facility]) injection 4 mg  4 mg Intravenous Q6H PRN Spero Geralds, MD   4 mg at 09/23/19 1540  . pantoprazole (PROTONIX) EC tablet 40 mg  40 mg Oral Daily Shelly Coss, MD   40 mg at 09/24/19 1246  . pentafluoroprop-tetrafluoroeth (GEBAUERS) aerosol 1 application  1 application Topical PRN Spero Geralds, MD      . polyethylene glycol (MIRALAX / GLYCOLAX) packet 17 g  17 g Oral Daily PRN Lenice Llamas  S, MD   17 g at 09/08/19 0553  . predniSONE (DELTASONE) tablet 10 mg  10 mg Oral Q breakfast Spero Geralds, MD   10 mg at 09/24/19 0843  .  primidone (MYSOLINE) tablet 50 mg  50 mg Oral q morning - 10a Spero Geralds, MD   50 mg at 09/24/19 0844  . pyridOXINE (VITAMIN B-6) tablet 200 mg  200 mg Oral BID Spero Geralds, MD   200 mg at 09/24/19 [id number]  . risperiDONE (RISPERDAL) tablet 0.5 mg  0.5 mg Oral BID Spero Geralds, MD   0.5 mg at 09/24/19 0844  . sodium chloride flush (NS) 0.9 % injection 3 mL  3 mL Intravenous Q12H Spero Geralds, MD   3 mL at 09/24/19 0854  . sodium chloride flush (NS) 0.9 % injection 3 mL  3 mL Intravenous PRN Spero Geralds, MD      . traZODone (DESYREL) tablet 50 mg  50 mg Oral Daily Spero Geralds, MD   50 mg at 09/23/19 2151     Discharge Medications: Please see discharge summary for a list of discharge medications.  Relevant Imaging Results:  Relevant Lab Results:   Additional Information [id number] [place], [place]

## 2019-09-24 NOTE — Progress Notes (Signed)
Patient ID: [name], female   DOB: [birth year], 69 y.o.   MRN: [id number] [facility] Progress Note   Assessment/ Plan:   1. Acute kidney Injury on known progressive chronic kidney disease: Although she has good urine output (on furosemide 40 mg daily), no evidence of renal recovery and the question arises as to whether she is truly progressed to end-stage renal disease given the available data/trajectory of renal function.  I recommend monitoring renal function for another 24 hours before making the decision to pursue dialysis access and as to whether she requires dialysis prior to leaving the hospital or after.  She has a temporary left femoral catheter without acute dialysis needs at this time. 2.  Acute hypoxic respiratory failure: Requiring NIPPV nightly, currently appears comfortable resting flat in bed without any oxygen supplementation. 3.  Anemia of chronic disease: With acceptable iron saturation, continue high-dose ESA.  No overt loss. 4.  Secondary hyperparathyroidism: Calcium and phosphorus level remain within acceptable range off ferric citrate.  On renal diet. 5.  Hypertension: Blood pressures currently appear to be under good control, will continue to follow on current medications including furosemide.  Subjective:   Reports to be feeling well and inquires about going home.  Denies any chest pain or shortness of breath.   Objective:   BP (!) 117/50 (BP Location: Right Arm)   Pulse 73   Temp 98.2 F (36.8 C) (Oral)   Resp 16   Ht 5\' 2"  (1.575 m)   Wt 87.3 kg   SpO2 98%   BMI 35.20 kg/m   Intake/Output Summary (Last 24 hours) at 09/24/2019 1028 Last data filed at 09/24/2019 0854 Gross per 24 hour  Intake 843 ml  Output 1665 ml  Net -822 ml   Weight change: 0 kg  Physical Exam: Gen: Comfortably resting in bed CVS: Pulse regular rhythm, normal rate, S1 and S2 normal Resp: Clear to auscultation, no rales/rhonchi Abd: Soft, obese, nontender, bowel  sounds normal Ext: Trace lower extremity edema with temporary left femoral dialysis catheter  Imaging: DG Swallowing Func-Speech Pathology  Result Date: 09/22/2019 Objective Swallowing Evaluation: Type of Study: MBS-Modified Barium Swallow Study  Patient Details Name: [name] MRN: [id number] Date of Birth: [birth year] Today's Date: 09/22/2019 Time: SLP Start Time (ACUTE ONLY): 1145 -SLP Stop Time (ACUTE ONLY): 1155 SLP Time Calculation (min) (ACUTE ONLY): 10 min Past Medical History: Past Medical History: Diagnosis Date . Anxiety  . Asthma  . Back pain  . Barrett esophagus  . CHF (congestive heart failure) ([place])  . CKD (chronic kidney disease) stage 3, GFR 30-59 ml/min  . COPD (chronic obstructive pulmonary disease) ([place])  . Depression  . Diastolic heart failure ([place])  . Essential hypertension  . Fibromyalgia  . GERD (gastroesophageal reflux disease)  . Gout  . History of pericarditis  . Hyperlipidemia  . Hypothyroid  . Major depressive disorder  . Memory loss  . Nephrolithiasis   2008 . Obesity hypoventilation syndrome ([facility])   CPAP . Obstructive sleep apnea  . PE (pulmonary embolism)   2010 . Peripheral neuropathy  . PTSD (post-traumatic stress disorder)  . Rheumatoid arthritis ([place])  . Schizophrenia ([place])  . Secondary Parkinson disease ([place]) 10/19/2018 . Steroid dependence ([place])  . Type 2 diabetes mellitus ([place])  . Vitamin D deficiency  Past Surgical History: Past Surgical History: Procedure Laterality Date . APPENDECTOMY   . CHOLECYSTECTOMY   . COLONOSCOPY    benign polyps (12/2000), repeat colonoscopy performed in 2007 . ENDOMETRIAL BIOPSY  10/03 benign columnar mucosa (limited material) . INSERTION OF DIALYSIS CATHETER Right 09/14/2019  Procedure: ATTEMPTED INSERTION OF DIALYSIS CATHETER;  Surgeon: Virl Cagey, MD;  Location: [facility];  Service: General;  Laterality: Right; . KIDNEY SURGERY   HPI: 67 with history of remote smoker, COPD, GERD, dysphagia, fibromyalgia, Barretts esophagus. admitted with  progressive wt gain, leg swelling sob with w/u c/w anasarca / bilateral effusions. Placement of HD catheter 1/25. Required intubation 1/23-1/30, reintubated 1/31-2/4. MBS 08/17/18 recommending Dys 3 texture, thin liquids with chin tuck. BSE 09/16/19 recommended continue NPO allowing ice chips and MBS planned 2/1 however pt was reintubated 1/31. CXR Moderate central vascular congestion, pulmonary edema and bilateral pleural effusions with overlying atelectasis, right greater than left.  Subjective: pleasant, cooperative Assessment / Plan / Recommendation CHL IP CLINICAL IMPRESSIONS 09/22/2019 Clinical Impression Patient presents with mild oral and a primary sensorimotor based pharyngeal dysphagia. She exhibited deep penetration to vocal cords with first sip of thin liquids and with trace amount of that penetrate resulting in trace aspiration. (View was slightly obscured by her shoulder). Penetration during prior to the swallow was secondary to poor epilgottic inversion leading to poor airway protection. Subsequent swallows of thin liquids, even with straw sips, resulted only in flash penetration. No residuals remained in pharynx. As compared to previous MBS (about a year ago), patient's swallow is slightly improved.  SLP Visit Diagnosis Dysphagia, oropharyngeal phase (R13.12) Attention and concentration deficit following -- Frontal lobe and executive function deficit following -- Impact on safety and function Mild aspiration risk   CHL IP TREATMENT RECOMMENDATION 09/22/2019 Treatment Recommendations Therapy as outlined in treatment plan below   Prognosis 09/22/2019 Prognosis for Safe Diet Advancement Good Barriers to Reach Goals -- Barriers/Prognosis Comment -- CHL IP DIET RECOMMENDATION 09/22/2019 SLP Diet Recommendations Dysphagia 3 (Mech soft) solids;Thin liquid Liquid Administration via Cup;Straw Medication Administration Whole meds with liquid Compensations Minimize environmental distractions;Slow rate;Small sips/bites  Postural Changes Seated upright at 90 degrees   CHL IP OTHER RECOMMENDATIONS 09/22/2019 Recommended Consults -- Oral Care Recommendations Oral care BID Other Recommendations --   CHL IP FOLLOW UP RECOMMENDATIONS 09/22/2019 Follow up Recommendations None   CHL IP FREQUENCY AND DURATION 09/22/2019 Speech Therapy Frequency (ACUTE ONLY) min 1 x/week Treatment Duration 1 week      CHL IP ORAL PHASE 09/22/2019 Oral Phase Impaired Oral - Pudding Teaspoon -- Oral - Pudding Cup -- Oral - Honey Teaspoon -- Oral - Honey Cup -- Oral - Nectar Teaspoon -- Oral - Nectar Cup -- Oral - Nectar Straw -- Oral - Thin Teaspoon -- Oral - Thin Cup -- Oral - Thin Straw -- Oral - Puree -- Oral - Mech Soft -- Oral - Regular Impaired mastication;Weak lingual manipulation;Delayed oral transit Oral - Multi-Consistency -- Oral - Pill -- Oral Phase - Comment --  CHL IP PHARYNGEAL PHASE 09/22/2019 Pharyngeal Phase Impaired Pharyngeal- Pudding Teaspoon -- Pharyngeal -- Pharyngeal- Pudding Cup -- Pharyngeal -- Pharyngeal- Honey Teaspoon -- Pharyngeal -- Pharyngeal- Honey Cup -- Pharyngeal -- Pharyngeal- Nectar Teaspoon -- Pharyngeal -- Pharyngeal- Nectar Cup -- Pharyngeal -- Pharyngeal- Nectar Straw -- Pharyngeal -- Pharyngeal- Thin Teaspoon -- Pharyngeal -- Pharyngeal- Thin Cup Reduced airway/laryngeal closure;Penetration/Aspiration before swallow;Trace aspiration;Reduced epiglottic inversion Pharyngeal Material enters airway, passes BELOW cords without attempt by patient to eject out (silent aspiration);Material enters airway, remains ABOVE vocal cords and not ejected out;Material enters airway, remains ABOVE vocal cords then ejected out;Material enters airway, CONTACTS cords and then ejected out Pharyngeal- Thin Straw Penetration/Aspiration during swallow;Reduced epiglottic  inversion;Reduced airway/laryngeal closure Pharyngeal Material enters airway, remains ABOVE vocal cords then ejected out Pharyngeal- Puree WFL Pharyngeal -- Pharyngeal- Mechanical Soft  -- Pharyngeal -- Pharyngeal- Regular WFL Pharyngeal -- Pharyngeal- Multi-consistency -- Pharyngeal -- Pharyngeal- Pill WFL Pharyngeal -- Pharyngeal Comment --  CHL IP CERVICAL ESOPHAGEAL PHASE 09/22/2019 Cervical Esophageal Phase WFL Pudding Teaspoon -- Pudding Cup -- Honey Teaspoon -- Honey Cup -- Nectar Teaspoon -- Nectar Cup -- Nectar Straw -- Thin Teaspoon -- Thin Cup -- Thin Straw -- Puree -- Mechanical Soft -- Regular -- Multi-consistency -- Pill -- Cervical Esophageal Comment -- Dannial Monarch 09/22/2019, 5:22 PM Sonia Baller, MA, CCC-SLP Speech Therapy [facility] Acute Rehab Pager: [phone]               Labs: BMET Recent Labs  Lab 09/18/19 0314 09/19/19 0404 09/20/19 0231 09/21/19 0252 09/22/19 0417 09/23/19 0941 09/24/19 0455  NA 138 142 141 143 142 142 138  K 3.5 4.0 3.3* 3.6 3.5 3.6 4.1  CL 102 106 104 104 106 106 100  CO2 26 26 26 26 27 25 25   GLUCOSE 317* 189* 237* 89 156* 212* 146*  BUN 48* 74* 60* 74* 73* 75* 76*  CREATININE 3.04* 3.85* 3.30* 3.86* 4.59* 5.15* 5.43*  CALCIUM 8.4* 8.5* 8.7* 8.8* 8.8* 8.4* 8.5*  PHOS 3.5 2.8 3.0 2.8 4.3 4.4 4.5   CBC Recent Labs  Lab 09/21/19 1409 09/24/19 0455  WBC 10.7* 9.9  HGB 9.6* 8.4*  HCT 29.4* 25.8*  MCV 98.7 100.0  PLT 261 242   Medications:    . apixaban  2.5 mg Oral BID  . aspirin  81 mg Oral q morning - 10a  . atorvastatin  20 mg Oral q1800  . calcitRIOL  0.25 mcg Oral Q M,W,F  . carvedilol  25 mg Oral BID WC  . Chlorhexidine Gluconate Cloth  6 each Topical Q0600  . cholecalciferol  4,000 Units Oral q morning - 10a  . darbepoetin (ARANESP) injection - NON-DIALYSIS  150 mcg Subcutaneous Q Thu-1800  . docusate  100 mg Oral BID  . fentaNYL (SUBLIMAZE) injection  25 mcg Intravenous Once  . furosemide  40 mg Oral Daily  . gabapentin  100 mg Oral BID  . hydrALAZINE  100 mg Oral Q8H  . influenza vaccine adjuvanted  0.5 mL Intramuscular Tomorrow-1000  . insulin aspart  0-9 Units Subcutaneous TID WC  . insulin  glargine  12 Units Subcutaneous QHS  . levothyroxine  25 mcg Oral Q0600  . mouth rinse  15 mL Mouth Rinse BID  . montelukast  10 mg Oral QHS  . multivitamin  15 mL Oral Daily  . neomycin-bacitracin-polymyxin  1 application Topical Daily  . predniSONE  10 mg Oral Q breakfast  . primidone  50 mg Oral q morning - 10a  . pyridOXINE  200 mg Oral BID  . risperiDONE  0.5 mg Oral BID  . sodium chloride flush  3 mL Intravenous Q12H  . traZODone  50 mg Oral Daily   Elmarie Shiley, MD 09/24/2019, 10:28 AM

## 2019-09-24 NOTE — Discharge Instructions (Signed)

## 2019-09-24 NOTE — Progress Notes (Signed)
Inpatient Diabetes Program Recommendations  AACE/ADA: New Consensus Statement on Inpatient Glycemic Control (2015)  Target Ranges:  Prepandial:   less than 140 mg/dL      Peak postprandial:   less than 180 mg/dL (1-2 hours)      Critically ill patients:  140 - 180 mg/dL   Lab Results  Component Value Date   GLUCAP 281 (H) 09/24/2019   HGBA1C 5.2 09/06/2019    Review of Glycemic Control Results for [name], [name] (MRN [id number]) as of 09/24/2019 13:05  Ref. Range 09/23/2019 06:48 09/23/2019 11:24 09/23/2019 16:11 09/23/2019 20:30 09/24/2019 06:51 09/24/2019 11:54  Glucose-Capillary Latest Ref Range: 70 - 99 mg/dL 172 (H) 227 (H) 324 (H) 329 (H) 110 (H) 281 (H)   Diabetes history: DM 2 Outpatient Diabetes medications: Trulicity 1.5 mg QFriday, Tresiba 18 units qhs, Tradjenta 5 mg Daily, PO Prednisone 10 mg Daily Current orders for Inpatient glycemic control:  Lantus 12 units Daily Novolog 0-9 units tid   Inpatient Diabetes Program Recommendations:    Consider Novolog 3 units tid meal coverage with parameters pt eat at least 50% of meals and glucose at least 80 mg/dl.  Thanks  Tama Headings RN, MSN, BC-ADM Inpatient Diabetes Coordinator Team Pager [phone] (8a-5p)

## 2019-09-24 NOTE — Progress Notes (Signed)
Physical Therapy Treatment Patient Details Name: [name] MRN: [id number] DOB: February 23, 1951 Today's Date: 09/24/2019    History of Present Illness 69 y.o. female   HTN, DM (on trulicity), hyperlipidemia, diastolic heart failure,  schizoaffective D/O with episodes of catatonia and sz d/o, COPD, CKD admitted with progressive wt gain, leg swelling sob with w/u c/w anasarca / bilateral effusions  more sob 1/23 and required intubation / mech with CTa neg PE, positive for mild bilateral pleural effusions with small lung voumes.  Extubated 1/29, then re-intubated 1/31 due to stridor, extubated 2/4.    PT Comments    Pt was assisted to stand from the recliner, mod assist with cues for HD cath protection.  Pt is assisted to bed with cues for body mechanics, repositioned on the bed with care of L cath.  Pt is motivated to move, but is cued to remind her about the restrictions of movement.  Follow acutely for practice of mobility and will expect her to transition to SNF for completion of rehab unless she becomes more independent with gait.   Follow Up Recommendations  SNF     Equipment Recommendations  None recommended by PT    Recommendations for Other Services       Precautions / Restrictions Precautions Precautions: Fall Restrictions Weight Bearing Restrictions: No Other Position/Activity Restrictions: L femoral HD catheter (non-tunneled) but Dr. Carolin Sicks approved OOB to chair with caution to watch catheter    Mobility  Bed Mobility Overal bed mobility: Needs Assistance Bed Mobility: Sit to Supine       Sit to supine: Mod assist   General bed mobility comments: cues for body  mechanics  Transfers Overall transfer level: Needs assistance Equipment used: Rolling walker (2 wheeled) Transfers: Sit to/from Stand Sit to Stand: Min assist;Mod assist(from chair)         General transfer comment: increased assist from the recliner as the bed  Ambulation/Gait              General Gait Details: deferred over HD cath   Stairs             Wheelchair Mobility    Modified Rankin (Stroke Patients Only)       Balance     Sitting balance-Leahy Scale: Fair       Standing balance-Leahy Scale: Poor Standing balance comment: UE support and assist for balance for hygiene                            Cognition Arousal/Alertness: Awake/alert Behavior During Therapy: WFL for tasks assessed/performed Overall Cognitive Status: No family/caregiver present to determine baseline cognitive functioning                                 General Comments: pt is not fully paying attention to cath, requires cues      Exercises      General Comments        Pertinent Vitals/Pain Pain Assessment: No/denies pain    Home Living                      Prior Function            PT Goals (current goals can now be found in the care plan section) Acute Rehab PT Goals Patient Stated Goal: to be stronger Progress towards PT goals: Progressing toward goals  Frequency    Min 3X/week      PT Plan Current plan remains appropriate    Co-evaluation              AM-PAC PT "6 Clicks" Mobility   Outcome Measure  Help needed turning from your back to your side while in a flat bed without using bedrails?: None Help needed moving from lying on your back to sitting on the side of a flat bed without using bedrails?: A Little Help needed moving to and from a bed to a chair (including a wheelchair)?: A Little Help needed standing up from a chair using your arms (e.g., wheelchair or bedside chair)?: A Lot Help needed to walk in hospital room?: A Lot Help needed climbing 3-5 steps with a railing? : Total 6 Click Score: 15    End of Session Equipment Utilized During Treatment: Gait belt Activity Tolerance: Patient limited by fatigue;Treatment limited secondary to medical complications (Comment) Patient left: in bed;with  call bell/phone within reach;with bed alarm set Nurse Communication: Mobility status PT Visit Diagnosis: Other abnormalities of gait and mobility (R26.89);Muscle weakness (generalized) (M62.81)     Time: [id number] PT Time Calculation (min) (ACUTE ONLY): 17 min  Charges:  $Therapeutic Activity: 8-22 mins                    Ramond Dial 09/24/2019, 10:32 PM   Mee Hives, PT MS Acute Rehab Dept. Number: [place] [facility] [place]

## 2019-09-24 NOTE — Progress Notes (Addendum)
PROGRESS NOTE    [name]  [id number] DOB: 1950-11-17 DOA: 09/06/2019 PCP: Glenda Chroman, MD   Brief Narrative: Patient is a 69 year old female from group home with history of chronic kidney disease, COPD, diastolic CHF who presented with shortness of breath, generalized edema.  She was found to have fluid overload on presentation.  She was on acute hypoxic story failure due to pulmonary edema and had to be intubated.  Currently extubated and respiratory status stable.  Current issues AKI on CKD which is likely due to progression of her underlying renal disease.  Nephrology is closely following.  Plan is to initiate hemodialysis if her kidney function does not improve.  Assessment & Plan:   Principal Problem:   Acute kidney injury superimposed on chronic kidney disease ([place]) Active Problems:   Type 2 diabetes mellitus with diabetic polyneuropathy, without long-term current use of insulin (HCC)   Normocytic anemia   Schizophrenia (HCC)   Depression   Essential hypertension   Chronic diastolic heart failure (HCC)   CKD (chronic kidney disease), stage IV (HCC)   Obesity, Class III, BMI 40-49.9 (morbid obesity) ([place])   Type 2 diabetes mellitus (HCC)   Hypothyroidism   Volume overload   On mechanically assisted ventilation (HCC)   Acute metabolic encephalopathy   Acute respiratory failure with hypoxia and hypercapnia (HCC)   SVT (supraventricular tachycardia) (HCC)   Acute pulmonary edema (HCC)   AKI on CKD: Nephrology following.  Creatinine in the range of 5. Nephrology planning to monitor and initiate hemodialysis if there is no further improvement in kidney function.  She has good urine output.  Acute hypoxic/hypercarbic respiratory failure: Present on admission.  Presented with anasarca.  Patient with acute renal failure.  She was intubated  on presentation.  Currently extubated and respiratory status stable.  Continue BiPAP at night.  Diastolic CHF/anasarca/pleural  effusion: Continue diuretics as recommended by nephrology.  Acute metabolic encephalopathy: Secondary to hypercarbia and presentation.  Resolved  Paroxysmal A. [id number] score 5.  Started on Eliquis  Moderate protein calorie moderation: Dietitian following  Hypertension: Currently blood pressure stable.  Continue current regimen.  Hypothyroidism: Continue Synthyroid  Normocytic anemia: Associated with chronic kidney disease.  Continue to monitor.  H&H stable.  History of depression/schizophrenia: Continue current medications  Diabetes type 2: Continue sliding insulin and  Lantus  Debility/deconditioning: Seen by PT/OT and recommended skilled nursing facility on discharge.    Nutrition Problem: Inadequate oral intake Etiology: inability to eat      DVT prophylaxis: Heparin [place] Code Status: Full Family Communication: None present at the bedside Disposition Plan: Patient is from group home.  She is not clinically stable for discharge because she might need dialysis and her AKI hasnt improved.  OT has recommended skilled facility.  Consultants: Nephrology Procedures: Intubation/extubation/central line placement.  Antimicrobials:  Anti-infectives (From admission, onward)   Start     Dose/Rate Route Frequency Ordered Stop   09/14/19 1500  meropenem (MERREM) 500 mg in sodium chloride 0.9 % 100 mL IVPB  Status:  Discontinued     500 mg 200 mL/hr over 30 Minutes Intravenous Every 24 hours 09/14/19 1345 09/17/19 0821   09/14/19 1400  vancomycin (VANCOREADY) IVPB 2000 mg/400 mL     2,000 mg 200 mL/hr over 120 Minutes Intravenous  Once 09/14/19 1328 09/14/19 1832   09/14/19 1346  vancomycin variable dose per unstable renal function (pharmacist dosing)  Status:  Discontinued      Does not apply See admin instructions 09/14/19 1346  09/16/19 0929   09/14/19 0600  vancomycin (VANCOCIN) IVPB 1000 mg/200 mL premix  Status:  Discontinued     1,000 mg 200 mL/hr over 60 Minutes  Intravenous On call to O.R. 09/13/19 1634 09/14/19 1219   09/11/19 1100  levofloxacin (LEVAQUIN) IVPB 500 mg  Status:  Discontinued     500 mg 100 mL/hr over 60 Minutes Intravenous Every 48 hours 09/09/19 1041 09/14/19 1259   09/09/19 1045  levofloxacin (LEVAQUIN) IVPB 750 mg     750 mg 100 mL/hr over 90 Minutes Intravenous  Once 09/09/19 1041 09/09/19 2208      Subjective:  Patient seen and examined at the bedside this morning.  Hemodynamically stable.  Denies any complaints.  Respiratory status stable.  Objective: Vitals:   09/23/19 2031 09/24/19 0435 09/24/19 0500 09/24/19 0837  BP: (!) 133/55 (!) 153/67  (!) 117/50  Pulse: 70 63  73  Resp: 15 15  16   Temp: 98.2 F (36.8 C) 97.8 F (36.6 C)  98.2 F (36.8 C)  TempSrc:    Oral  SpO2: 96% 100%  98%  Weight: 87.3 kg  87.3 kg   Height:        Intake/Output Summary (Last 24 hours) at 09/24/2019 1208 Last data filed at 09/24/2019 1035 Gross per 24 hour  Intake 843 ml  Output 1665 ml  Net -822 ml   Filed Weights   09/22/19 2300 09/23/19 2031 09/24/19 0500  Weight: 87.3 kg 87.3 kg 87.3 kg    Examination:  General exam: Deconditioning/debility/obese HEENT:PERRL,Oral mucosa moist, Ear/Nose normal on gross exam Respiratory system: Diminished air sounds on the bases, no wheezes or crackles  Cardiovascular system: S1 & S2 heard, RRR. No JVD, murmurs, rubs, gallops or clicks.Trace pedal edema. Gastrointestinal system: Abdomen is nondistended, soft and nontender. No organomegaly or masses felt. Normal bowel sounds heard. Central nervous system: Alert and oriented. No focal neurological deficits. Extremities: No edema, no clubbing ,no cyanosis Skin: No rashes, lesions or ulcers,no icterus ,no pallor    Data Reviewed: I have personally reviewed following labs and imaging studies  CBC: Recent Labs  Lab 09/21/19 1409 09/24/19 0455  WBC 10.7* 9.9  HGB 9.6* 8.4*  HCT 29.4* 25.8*  MCV 98.7 100.0  PLT 261 [id number]   Basic  Metabolic Panel: Recent Labs  Lab 09/20/19 0231 09/21/19 0252 09/22/19 0417 09/23/19 0941 09/24/19 0455  NA 141 143 142 142 138  K 3.3* 3.6 3.5 3.6 4.1  CL 104 104 106 106 100  CO2 26 26 27 25 25   GLUCOSE 237* 89 156* 212* 146*  BUN 60* 74* 73* 75* 76*  CREATININE 3.30* 3.86* 4.59* 5.15* 5.43*  CALCIUM 8.7* 8.8* 8.8* 8.4* 8.5*  PHOS 3.0 2.8 4.3 4.4 4.5   GFR: Estimated Creatinine Clearance: 10.3 mL/min (A) (by C-G formula based on SCr of 5.43 mg/dL (H)). Liver Function Tests: Recent Labs  Lab 09/20/19 0231 09/21/19 0252 09/22/19 0417 09/23/19 0941 09/24/19 0455  ALBUMIN 2.1* 2.0* 2.1* 2.0* 2.0*   No results for input(s): LIPASE, AMYLASE in the last 168 hours. No results for input(s): AMMONIA in the last 168 hours. Coagulation Profile: No results for input(s): INR, PROTIME in the last 168 hours. Cardiac Enzymes: No results for input(s): CKTOTAL, CKMB, CKMBINDEX, TROPONINI in the last 168 hours. BNP (last 3 results) No results for input(s): PROBNP in the last 8760 hours. HbA1C: No results for input(s): HGBA1C in the last 72 hours. CBG: Recent Labs  Lab 09/23/19 1124 09/23/19 1611 09/23/19 2030  09/24/19 0651 09/24/19 1154  GLUCAP 227* 324* 329* 110* 281*   Lipid Profile: No results for input(s): CHOL, HDL, LDLCALC, TRIG, CHOLHDL, LDLDIRECT in the last 72 hours. Thyroid Function Tests: No results for input(s): TSH, T4TOTAL, FREET4, T3FREE, THYROIDAB in the last 72 hours. Anemia Panel: No results for input(s): VITAMINB12, FOLATE, FERRITIN, TIBC, IRON, RETICCTPCT in the last 72 hours. Sepsis Labs: No results for input(s): PROCALCITON, LATICACIDVEN in the last 168 hours.  Recent Results (from the past 240 hour(s))  Culture, respiratory (non-expectorated)     Status: None   Collection Time: 09/14/19  1:00 PM   Specimen: Tracheal Aspirate; Respiratory  Result Value Ref Range Status   Specimen Description   Final    TRACHEAL ASPIRATE Performed at [facility], [address], [place], [place] [address]    Special Requests   Final    Normal Performed at [facility], [address], [place], [place] [address]    Gram Stain   Final    ABUNDANT WBC PRESENT, PREDOMINANTLY PMN FEW GRAM POSITIVE RODS RARE GRAM POSITIVE COCCI    Culture   Final    RARE Consistent with normal respiratory flora. Performed at [facility], [place] [address], [place],  [address]    Report Status 09/16/2019 FINAL  Final         Radiology Studies: No results found.      Scheduled Meds: . apixaban  2.5 mg Oral BID  . aspirin  81 mg Oral q morning - 10a  . atorvastatin  20 mg Oral q1800  . calcitRIOL  0.25 mcg Oral Q M,W,F  . carvedilol  25 mg Oral BID WC  . Chlorhexidine Gluconate Cloth  6 each Topical Q0600  . cholecalciferol  4,000 Units Oral q morning - 10a  . darbepoetin (ARANESP) injection - NON-DIALYSIS  150 mcg Subcutaneous Q Thu-1800  . docusate  100 mg Oral BID  . fentaNYL (SUBLIMAZE) injection  25 mcg Intravenous Once  . furosemide  40 mg Oral Daily  . gabapentin  100 mg Oral BID  . hydrALAZINE  100 mg Oral Q8H  . influenza vaccine adjuvanted  0.5 mL Intramuscular Tomorrow-1000  . insulin aspart  0-9 Units Subcutaneous TID WC  . insulin glargine  12 Units Subcutaneous QHS  . levothyroxine  25 mcg Oral Q0600  . mouth rinse  15 mL Mouth Rinse BID  . montelukast  10 mg Oral QHS  . multivitamin  15 mL Oral Daily  . neomycin-bacitracin-polymyxin  1 application Topical Daily  . predniSONE  10 mg Oral Q breakfast  . primidone  50 mg Oral q morning - 10a  . pyridOXINE  200 mg Oral BID  . risperiDONE  0.5 mg Oral BID  . sodium chloride flush  3 mL Intravenous Q12H  . traZODone  50 mg Oral Daily   Continuous Infusions: . sodium chloride    . sodium chloride    . sodium chloride    . sodium chloride Stopped (09/21/19 1105)  . sodium chloride    . sodium chloride       LOS: 18 days    Time spent: 25 mins. More than 50% of  that time was spent in counseling and/or coordination of care.      Shelly Coss, MD [facility] P2/03/2020, 12:08 PM

## 2019-09-24 NOTE — Plan of Care (Signed)
°  Problem: Coping: °Goal: Level of anxiety will decrease °Outcome: Progressing °  °

## 2019-09-24 NOTE — Progress Notes (Addendum)
  Speech Language Pathology Treatment: Dysphagia  Patient Details Name: [name] MRN: [id number] DOB: 01/02/1951 Today's Date: 09/24/2019 Time: [id number] SLP Time Calculation (min) (ACUTE ONLY): 23 min  Assessment / Plan / Recommendation Clinical Impression  Pt seen at bedside for skilled ST targeting dysphagia. Pt recently placed on diet following MBSS 09/22/19. Pt sitting upright in bed, able to feed self with set up assistance.  Pt seen with cup sips of thin liquids and items from dysphagia 3 dinner tray (spaghetti with chopped chicken). Pt provided fading verbal cues to reduce bolus size/take smaller bites and take a sip of drink every 1-2 bites. With thin liquids, patient's oral phase notable for minimal anterior labial spillage and immediate throat clear x1. No other overt s/sx aspiration. With solids, oral phase remarkable for mildly prolonged mastication. Min-mild diffuse oral residue observed following the swallow, patient able to clear with subsequent swallows. Patient with some wet voice quality noted after the swallow, this cleared with cued throat clear-reswallow. RN entered to give patient medication, patient able to swallow pills whole with thin liquid. No overt s/sx aspiration with pills.  ST provided patient with education pertaining to compensatory strategies, patient verbalized understanding.  Signage posted at bedside with diet recommendations and compensatory strategy recommendations. Recommend dysphagia 3 solids/thin liquids: small bites/sips, slow rate of intake, sit upright for PO intake, alternate liquids and solids, oral care BID. ST to follow acutely as per POC.   HPI HPI: 59 with history of remote smoker, COPD, GERD, dysphagia, fibromyalgia, Barretts esophagus. admitted with progressive wt gain, leg swelling sob with w/u c/w anasarca / bilateral effusions. Placement of HD catheter 1/25. Required intubation 1/23-1/30, reintubated 1/31-2/4. MBS 08/17/18 recommending Dys 3  texture, thin liquids with chin tuck. BSE 09/16/19 recommended continue NPO allowing ice chips and MBS planned 2/1 however pt was reintubated 1/31. CXR Moderate central vascular congestion, pulmonary edema and bilateral pleural effusions with overlying atelectasis, right greater than left.      SLP Plan  Continue with current plan of care       Recommendations  Diet recommendations: Dysphagia 3 (mechanical soft);Thin liquid Liquids provided via: Cup;Straw Medication Administration: Whole meds with liquid Supervision: Intermittent supervision to cue for compensatory strategies Compensations: Minimize environmental distractions;Slow rate;Small sips/bites Postural Changes and/or Swallow Maneuvers: Seated upright 90 degrees                Oral Care Recommendations: Oral care BID Follow up Recommendations: None SLP Visit Diagnosis: Dysphagia, oropharyngeal phase (R13.12) Plan: Continue with current plan of care       [place], M.Ed., [facility] [phone]: Acute Rehab office [phone] - pager   Jaydeen Odor 09/24/2019, 5:16 PM

## 2019-09-24 NOTE — Progress Notes (Signed)
Nutrition Follow-up  DOCUMENTATION CODES:   Obesity unspecified  INTERVENTION:   Nepro Shake po BID, each supplement provides 425 kcal and 19 grams protein  Provided education for Healthy Eating with Kidney Disease   NUTRITION DIAGNOSIS:   Increased nutrient needs related to acute illness(AKI superimposed on CKD requiring HD) as evidenced by estimated needs.  GOAL:   Patient will meet greater than or equal to 90% of their needs  Progressing.  MONITOR:   PO intake, Supplement acceptance, Weight trends, Labs, I & O's  REASON FOR ASSESSMENT:   Consult Assessment of nutrition requirement/status  ASSESSMENT:   69 yo female admitted to [facility] 1/21 with weight gain, swelling, SOB. Intubated 1/23. Transferred to [facility] on 1/29 for placement of permanent HD access. PMH includes schizophrenia, Barrett esophagus, Vitamin D deficiency, peripheral neuropathy, OHS, CHF, COPD, DM-2, HTN, CKD-3, PTSD.  01/23 - OG tube placed 01/27- pt started on HD 01/30- pt extubated 01/31- pt re-intubated  02/04 - pt extubated, OG tube removed  Per nephrology, pt has a temporary left femoral catheter; renal function will be monitored for another 24 hours before making the decision to pursue dialysis access and as to whether the pt requires dialysis prior to leaving the hospital or after.  Pt reports good appetite at time of RD visit. When asked about diet at home, pt states "I can't remember," but does report consuming 3 meals per day and 1 chocolate Glucerna each day. Pt denies wt loss PTA.   Discussed pt with RN who reports pt is consuming meals well.   100% meal intake documented.   Medications reviewed and include: Rocaltrol, Vitamin D3, Colace, Lasix, SSI, Lantus, MVI liquid, Deltasone, Vitamin B6  Labs reviewed: BUN 76 (H), Creatinine 5.43 (H) CBGs 110-281  UOP: 2067ml x24 hours I/O: -16,835.60ml since admit    Diet Order:   Diet Order            DIET DYS 3 Room service appropriate? Yes  with Assist; Fluid consistency: Thin  Diet effective now              EDUCATION NEEDS:   Not appropriate for education at this time  Skin:  Skin Assessment: Reviewed RN Assessment  Last BM:  2/8 type 6  Height:   Ht Readings from Last 1 Encounters:  09/11/19 5\' 2"  (1.575 m)    Weight:   Wt Readings from Last 1 Encounters:  09/24/19 87.3 kg    BMI:  Body mass index is 35.2 kg/m.  Estimated Nutritional Needs:   Kcal:  1800-2000  Protein:  115-125 grams  Fluid:  UOP + 1L   Larkin Ina, MS, RD, LDN RD pager number and weekend/on-call pager number located in [place].

## 2019-09-24 NOTE — Social Work (Addendum)
CSW called [facility] Hotline. Spoke with helpline who directed me to call Betsey Amen, [place], [phone] ext [phone]. Message left with this writers number as well as number for CSW colleague Lorriane Shire who is assigned during the week to 40M.   CSW continuing to follow for support with disposition when medically appropriate. Pt placement will be complicated by her Medicaid only status and need for transportation to dialysis.   Westley Hummer, MSW, [place] [facility]

## 2019-09-25 DIAGNOSIS — N189 Chronic kidney disease, unspecified: Secondary | ICD-10-CM | POA: Diagnosis not present

## 2019-09-25 DIAGNOSIS — R0602 Shortness of breath: Secondary | ICD-10-CM | POA: Diagnosis not present

## 2019-09-25 DIAGNOSIS — E8779 Other fluid overload: Secondary | ICD-10-CM | POA: Diagnosis not present

## 2019-09-25 DIAGNOSIS — N179 Acute kidney failure, unspecified: Secondary | ICD-10-CM | POA: Diagnosis not present

## 2019-09-25 LAB — RENAL FUNCTION PANEL
Albumin: 2 g/dL — ABNORMAL LOW (ref 3.5–5.0)
Anion gap: 12 (ref 5–15)
BUN: 75 mg/dL — ABNORMAL HIGH (ref 8–23)
CO2: 24 mmol/L (ref 22–32)
Calcium: 8.5 mg/dL — ABNORMAL LOW (ref 8.9–10.3)
Chloride: 101 mmol/L (ref 98–111)
Creatinine, Ser: 5.71 mg/dL — ABNORMAL HIGH (ref 0.44–1.00)
GFR calc Af Amer: 8 mL/min — ABNORMAL LOW (ref >60)
GFR calc non Af Amer: 7 mL/min — ABNORMAL LOW (ref >60)
Glucose, Bld: 156 mg/dL — ABNORMAL HIGH (ref 70–99)
Phosphorus: 4.2 mg/dL (ref 2.5–4.6)
Potassium: 4.2 mmol/L (ref 3.5–5.1)
Sodium: 137 mmol/L (ref 135–145)

## 2019-09-25 LAB — GLUCOSE, CAPILLARY
Glucose-Capillary: 162 mg/dL — ABNORMAL HIGH (ref 70–99)
Glucose-Capillary: 186 mg/dL — ABNORMAL HIGH (ref 70–99)
Glucose-Capillary: 213 mg/dL — ABNORMAL HIGH (ref 70–99)
Glucose-Capillary: 183 mg/dL — ABNORMAL HIGH (ref 70–99)

## 2019-09-25 MED ORDER — VANCOMYCIN HCL IN DEXTROSE 1-5 GM/200ML-% IV SOLN
1000.0000 mg | INTRAVENOUS | Status: DC
  Filled 2019-09-25: qty 200

## 2019-09-25 MED ORDER — GABAPENTIN 300 MG PO CAPS
300.0000 mg | ORAL_CAPSULE | Freq: Three times a day (TID) | ORAL | Status: DC
  Administered 2019-09-25: 300 mg via ORAL
  Filled 2019-09-25: qty 1

## 2019-09-25 MED ORDER — HEPARIN SODIUM (PORCINE) 1000 UNIT/ML IJ SOLN
INTRAMUSCULAR | Status: AC
  Filled 2019-09-25: qty 4

## 2019-09-25 MED ORDER — GABAPENTIN 100 MG PO CAPS
100.0000 mg | ORAL_CAPSULE | Freq: Three times a day (TID) | ORAL | Status: DC
  Administered 2019-09-25 – 2019-10-01 (×18): 100 mg via ORAL
  Filled 2019-09-25 (×19): qty 1

## 2019-09-25 MED ORDER — HYDROCODONE-ACETAMINOPHEN 5-325 MG PO TABS
1.0000 | ORAL_TABLET | Freq: Four times a day (QID) | ORAL | Status: DC | PRN
  Administered 2019-09-25 – 2019-10-01 (×14): 1 via ORAL
  Filled 2019-09-25 (×13): qty 1

## 2019-09-25 MED ORDER — HALOPERIDOL LACTATE 5 MG/ML IJ SOLN
2.5000 mg | Freq: Four times a day (QID) | INTRAMUSCULAR | Status: DC | PRN
  Filled 2019-09-25: qty 1

## 2019-09-25 MED ORDER — HYDROCODONE-ACETAMINOPHEN 5-325 MG PO TABS
ORAL_TABLET | ORAL | Status: AC
  Filled 2019-09-25: qty 1

## 2019-09-25 NOTE — Progress Notes (Signed)
  Speech Language Pathology Treatment: Dysphagia  Patient Details Name: [name] MRN: [id number] DOB: 1951/06/13 Today's Date: 09/25/2019 Time: 1005-1020 SLP Time Calculation (min) (ACUTE ONLY): 15 min  Assessment / Plan / Recommendation Clinical Impression  Patient seen at bedside for skilled ST targeting dysphagia. Pt upright in bed, oral care completed. Pt complaining of leg/feet pain. Nursing aware. Pt seen with thin liquids via straw sips: no overt s/sx aspiration or distress. Pt seen with chopped peaches: oral phase notable for prolonged mastication, no overt s/sx aspiration. Upgraded diet texture trial (regular texture graham crackers): pt with moderately prolonged mastication prior to initiation of the swallow. Pt with mild diffuse oral residue, she was able to clear with with spontaneous subsequent swallow. No overt s/sx aspiration with graham cracker. D/w patient re: diet textures initiated. Pt reports she prefers D3 at this time as she has concerns about being able to cut up meats by herself and has concerns about prolonged mastication required with regular solids. ST provided education re: compensatory strategies of taking small bites/sips, alternating liquids and solids, sitting upright for all PO intake. Pt verbalized understanding, but reinforcement needed to ensure strategies are followed. ST to follow briefly as per POC.  HPI HPI: 47 with history of remote smoker, COPD, GERD, dysphagia, fibromyalgia, Barretts esophagus. admitted with progressive wt gain, leg swelling sob with w/u c/w anasarca / bilateral effusions. Placement of HD catheter 1/25. Required intubation 1/23-1/30, reintubated 1/31-2/4. MBS 08/17/18 recommending Dys 3 texture, thin liquids with chin tuck. BSE 09/16/19 recommended continue NPO allowing ice chips and MBS planned 2/1 however pt was reintubated 1/31. CXR Moderate central vascular congestion, pulmonary edema and bilateral pleural effusions with overlying  atelectasis, right greater than left.      SLP Plan  Continue with current plan of care       Recommendations  Diet recommendations: Dysphagia 3 (mechanical soft);Thin liquid Medication Administration: Whole meds with liquid Supervision: Intermittent supervision to cue for compensatory strategies Compensations: Minimize environmental distractions;Slow rate;Small sips/bites Postural Changes and/or Swallow Maneuvers: Seated upright 90 degrees                Oral Care Recommendations: Oral care BID Follow up Recommendations: None SLP Visit Diagnosis: Dysphagia, oropharyngeal phase (R13.12) Plan: Continue with current plan of care       [place], M.Ed., [facility] Acute Rehabilitation [phone]: Acute Rehab office [phone] - pager    Shauntee Karp 09/25/2019, 10:26 AM

## 2019-09-25 NOTE — Significant Event (Signed)
Rapid Response Event Note  Overview: Time Called: 1847 Arrival Time: 1850 Event Type: Other (Comment)(Pt starring off.) Pt was having episode of emesis, RN was at bedside sliding pt up in bed when pt began having a blank stare and not following commands. VS:  BP 157/70 (95), HR 78, SpO2 99, RR 25.   Initial Focused Assessment: Pt lying in bed, alert. PERRLA, 1mm. Pt does not follows commands, blink response present. Minimal response to pain. Pt will not communicate with staff. Pt will have blank forward gaze, then cross her eyes, then shift her gaze and stares into another direction. No twitching or seizure like activity noted. Hx of schizophrenia and pt has been anxious during this admission.  Pt immediately states she feels better once Haldol IV was given and she immediately breaks her stare. After Haldol is given she intermittently begins to stare, but will converse with staff. Purposeful movements to all extremities, follows commands, and communicates with staff. She tearfully voices anxiety r/t plan for tunneled HD catheter placement.  Interventions: Haldol 5mg  IV given  Plan of Care (if not transferred): Provide pt with scheduled meds and treat anxiety appropriately.  Call rapid response for further needs.  Event Summary: Name of Physician Notified: Dr. Tawanna Solo at [phone]    at    Outcome: Stayed in room and stabalized  Event End Time: [place]

## 2019-09-25 NOTE — Progress Notes (Signed)
Nephrology note from 09/25/19 faxed to [facility] as patient has now been deemed ESRD, which is a change from when she was originally referred as AKI. Renal Navigator will continue to follow.  Alphonzo Cruise, [place] [facility]al Navigator [phone]

## 2019-09-25 NOTE — Progress Notes (Signed)
RT placed patient on CPAP for tonight. Patient tolerating well at this time. RT will monitor as needed. 

## 2019-09-25 NOTE — Progress Notes (Addendum)
HPI: consult by Dr. Donzetta Matters on 09/16/19: 69 y/o female ESRD.   She had an attempted right internal jugular tunneled catheter placement but the wire would not pass centrally.  Consult is now for permanent dialysis access with tunneled catheter placement.  Currently not on HD.  Has temp left femoral Catheter.   Objective 140/62 76 97.9 F (36.6 C) (Oral) 16 98%  Intake/Output Summary (Last 24 hours) at 09/25/2019 1019 Last data filed at 09/25/2019 0600 Gross per 24 hour  Intake 997 ml  Output 1200 ml  Net -203 ml    Palpable pulses B UE Left femoral cath intact   +-----------------+-------------+----------+--------------+  Right Cephalic  Diameter (cm)Depth (cm)  Findings    +-----------------+-------------+----------+--------------+  Shoulder       0.28     1.44           +-----------------+-------------+----------+--------------+  Prox upper arm    0.31     1.32           +-----------------+-------------+----------+--------------+  Mid upper arm    0.47     0.91           +-----------------+-------------+----------+--------------+  Dist upper arm    0.32     0.42           +-----------------+-------------+----------+--------------+  Antecubital fossa  0.44     0.32           +-----------------+-------------+----------+--------------+  Prox forearm     0.32     0.59           +-----------------+-------------+----------+--------------+  Mid forearm     0.35     0.52   branching    +-----------------+-------------+----------+--------------+  Dist forearm               not visualized  +-----------------+-------------+----------+--------------+   +-----------------+-------------+----------+--------+  Right Basilic  Diameter (cm)Depth (cm)Findings  +-----------------+-------------+----------+--------+  Prox  upper arm    0.54     1.24        +-----------------+-------------+----------+--------+  Mid upper arm    0.43     0.82        +-----------------+-------------+----------+--------+  Dist upper arm    0.38     0.48        +-----------------+-------------+----------+--------+  Antecubital fossa  0.43     0.48        +-----------------+-------------+----------+--------+  Prox forearm     0.40     0.67        +-----------------+-------------+----------+--------+  Mid forearm     0.38     0.75        +-----------------+-------------+----------+--------+  Distal forearm    0.35     0.59        +-----------------+-------------+----------+--------+   +-----------------+-------------+----------+------------------------------+   Left Cephalic  Diameter (cm)Depth (cm)      Findings         +-----------------+-------------+----------+------------------------------+   Shoulder       0.29     1.07                     +-----------------+-------------+----------+------------------------------+   Prox upper arm    0.26     0.72                     +-----------------+-------------+----------+------------------------------+   Mid upper arm              not visualized due to  bandages  +-----------------+-------------+----------+------------------------------+   Dist upper arm  not visualized due to  bandages  +-----------------+-------------+----------+------------------------------+   Antecubital fossa  0.38     0.33                     +-----------------+-------------+----------+------------------------------+   Prox forearm     0.32     0.47                      +-----------------+-------------+----------+------------------------------+   Mid forearm                   not visualized        +-----------------+-------------+----------+------------------------------+   Dist forearm                   not visualized        +-----------------+-------------+----------+------------------------------+    +-----------------+-------------+----------+------------------------------+   Left Basilic   Diameter (cm)Depth (cm)      Findings         +-----------------+-------------+----------+------------------------------+   Prox upper arm    0.43     0.76                     +-----------------+-------------+----------+------------------------------+   Mid upper arm    0.44     1.06                     +-----------------+-------------+----------+------------------------------+   Dist upper arm              not visualized due to  bandages  +-----------------+-------------+----------+------------------------------+   Antecubital fossa  0.36     1.44                     +-----------------+-------------+----------+------------------------------+   Prox forearm     0.25     0.81                     +-----------------+-------------+----------+------------------------------+   Mid forearm     0.27     0.55                     +-----------------+-------------+----------+------------------------------+   Distal forearm    0.24     0.52                     +-----------------+-------------+----------+------------------------------+    *See table(s) above for measurements and observations.    Assessment/Planning:  ESRD with acceptable right basilic and possible cephalic and TDC placement likely on the  left  with removal of left femoral temp cath by Dr. Donzetta Matters Thursday 09/27/19. NPO, plan for right UE basilic verses cephalic verses graft.   Attempted ABORTED Tunneled Dialysis Catheter Placement, Right Internal Jugular 09/14/19 Dr. Curlene Labrum.     An Ultrasound was used to verify that the right internal jugular vein was patent but there were enlarged superficial tributaries and following the confluence down to the internal jugular there appeared to be a clot distally as the vein lumen was not as clearly visible or compressible.  She then  attempted cannulation and was able to get into the internal jugular without issue with good flow back, but the wire would not pass down into the chest.    Roxy Horseman 09/25/2019 10:19 AM --  Laboratory Lab Results: Recent Labs    09/24/19 0455  WBC 9.9  HGB 8.4*  HCT 25.8*  PLT 242   BMET Recent Labs    09/24/19 0455 09/25/19 0417  NA 138 137  K 4.1 4.2  CL 100 101  CO2 25 24  GLUCOSE 146* 156*  BUN 76* 75*  CREATININE 5.43* 5.71*  CALCIUM 8.5* 8.5*    COAG Lab Results  Component Value Date   INR 1.1 09/09/2019   INR 1.06 09/15/2018   INR 0.91 07/27/2018   No results found for: PTT   I have independently interviewed and examined patient and agree with PA assessment and plan above. tdc and right arm avf vs avg on Thursday.  Brazos Sandoval C. Donzetta Matters, MD [facility] Office: [phone] Pager: [phone]

## 2019-09-25 NOTE — Progress Notes (Signed)
Patient ID: [name], female   DOB: 01-17-1951, 69 y.o.   MRN: [id number] [facility] Progress Note   Assessment/ Plan:   1. Acute kidney Injury on known progressive chronic kidney disease-now end-stage renal disease: She maintains decent urine output however continues to have poor clearance/worsening azotemia.  I will consult with vascular surgery for permanent access and placement of a tunneled hemodialysis catheter and order for hemodialysis today with the plan to begin outpatient dialysis unit placement.  TTS at this time. 2.  Acute hypoxic respiratory failure: Requiring NIPPV nightly, currently appears comfortable resting flat in bed without any oxygen supplementation. 3.  Anemia of chronic disease: With acceptable iron saturation, continue high-dose ESA.  No overt loss. 4.  Secondary hyperparathyroidism: Calcium and phosphorus level remain within acceptable range off ferric citrate.  On renal diet. 5.  Hypertension: Blood pressures currently appear to be under good control, will continue to follow on current medications including furosemide.  Subjective:   Reports to be feeling poorly this morning because of leg pain.   Objective:   BP 140/62 (BP Location: Right Arm)   Pulse 76   Temp 97.9 F (36.6 C) (Oral) Comment: Reviewed by Elgie Congo, MSN, RN, PCCN  Resp 16   Ht 5\' 2"  (1.575 m)   Wt 87.3 kg   SpO2 98%   BMI 35.20 kg/m   Intake/Output Summary (Last 24 hours) at 09/25/2019 1038 Last data filed at 09/25/2019 0600 Gross per 24 hour  Intake 997 ml  Output 1000 ml  Net -3 ml   Weight change:   Physical Exam: Gen: Comfortably resting in bed CVS: Pulse regular rhythm, normal rate, S1 and S2 normal Resp: Clear to auscultation, no rales/rhonchi Abd: Soft, obese, nontender, bowel sounds normal Ext: Trace lower extremity edema with temporary left femoral dialysis catheter  Imaging: No results found.  Labs: BMET Recent Labs  Lab 09/19/19 0404  09/20/19 0231 09/21/19 0252 09/22/19 0417 09/23/19 0941 09/24/19 0455 09/25/19 0417  NA 142 141 143 142 142 138 137  K 4.0 3.3* 3.6 3.5 3.6 4.1 4.2  CL 106 104 104 106 106 100 101  CO2 26 26 26 27 25 25 24   GLUCOSE 189* 237* 89 156* 212* 146* 156*  BUN 74* 60* 74* 73* 75* 76* 75*  CREATININE 3.85* 3.30* 3.86* 4.59* 5.15* 5.43* 5.71*  CALCIUM 8.5* 8.7* 8.8* 8.8* 8.4* 8.5* 8.5*  PHOS 2.8 3.0 2.8 4.3 4.4 4.5 4.2   CBC Recent Labs  Lab 09/21/19 1409 09/24/19 0455  WBC 10.7* 9.9  HGB 9.6* 8.4*  HCT 29.4* 25.8*  MCV 98.7 100.0  PLT 261 242   Medications:    . apixaban  2.5 mg Oral BID  . aspirin  81 mg Oral q morning - 10a  . atorvastatin  20 mg Oral q1800  . calcitRIOL  0.25 mcg Oral Q M,W,F  . carvedilol  25 mg Oral BID WC  . Chlorhexidine Gluconate Cloth  6 each Topical Q0600  . cholecalciferol  4,000 Units Oral q morning - 10a  . darbepoetin (ARANESP) injection - NON-DIALYSIS  150 mcg Subcutaneous Q Thu-1800  . docusate  100 mg Oral BID  . feeding supplement (NEPRO CARB STEADY)  237 mL Oral BID BM  . fentaNYL (SUBLIMAZE) injection  25 mcg Intravenous Once  . furosemide  40 mg Oral Daily  . gabapentin  300 mg Oral TID  . hydrALAZINE  100 mg Oral Q8H  . influenza vaccine adjuvanted  0.5 mL Intramuscular  Tomorrow-1000  . insulin aspart  0-9 Units Subcutaneous TID WC  . insulin glargine  12 Units Subcutaneous QHS  . levothyroxine  25 mcg Oral Q0600  . mouth rinse  15 mL Mouth Rinse BID  . montelukast  10 mg Oral QHS  . multivitamin  15 mL Oral Daily  . neomycin-bacitracin-polymyxin  1 application Topical Daily  . pantoprazole  40 mg Oral Daily  . predniSONE  10 mg Oral Q breakfast  . primidone  50 mg Oral q morning - 10a  . pyridOXINE  200 mg Oral BID  . risperiDONE  0.5 mg Oral BID  . sodium chloride flush  3 mL Intravenous Q12H  . traZODone  50 mg Oral Daily   Elmarie Shiley, MD 09/25/2019, 10:38 AM

## 2019-09-25 NOTE — Progress Notes (Signed)
PROGRESS NOTE    [name]  [id number] DOB: 1951/05/25 DOA: 09/06/2019 PCP: Glenda Chroman, MD   Brief Narrative: Patient is a 69 year old female from group home with history of chronic kidney disease, COPD, diastolic CHF who presented with shortness of breath, generalized edema.  She was found to have fluid overload on presentation.  She was on acute hypoxic respiratpry failure due to pulmonary edema and had to be intubated.  Currently extubated and respiratory status stable.  Current issues AKI on CKD which is likely most likely  progression of her underlying renal disease.  Nephrology is closely following.  Plan is to initiate hemodialysis .  She is undergoing tunneled catheter placement today.  Assessment & Plan:   Principal Problem:   Acute kidney injury superimposed on chronic kidney disease ([place]) Active Problems:   Type 2 diabetes mellitus with diabetic polyneuropathy, without long-term current use of insulin (HCC)   Normocytic anemia   Schizophrenia (HCC)   Depression   Essential hypertension   Chronic diastolic heart failure (HCC)   CKD (chronic kidney disease), stage IV (HCC)   Obesity, Class III, BMI 40-49.9 (morbid obesity) ([place])   Type 2 diabetes mellitus (HCC)   Hypothyroidism   Volume overload   On mechanically assisted ventilation (HCC)   Acute metabolic encephalopathy   Acute respiratory failure with hypoxia and hypercapnia (HCC)   SVT (supraventricular tachycardia) (HCC)   Acute pulmonary edema (HCC)   ESRD: Nephrology following.  Creatinine in the range of 5. Nephrology planning to initiate hemodialysis.She has good urine output.  Acute hypoxic/hypercarbic respiratory failure: Present on admission.  Presented with anasarca.  Patient with acute renal failure.  She was intubated  on presentation.  Currently extubated and respiratory status stable.  Continue BiPAP at night.  Diastolic CHF/anasarca/pleural effusion: Continue diuretics as recommended by  nephrology.  Anticipate further improvement after hemodialysis.  Acute metabolic encephalopathy: Secondary to hypercarbia and presentation.  Resolved  Paroxysmal A. [id number] score 5.  On Eliquis 2.5 mg bID  Moderate protein calorie moderation: Dietitian following  Hypertension: Currently blood pressure stable.  Continue current regimen.  Hypothyroidism: Continue Synthyroid  Normocytic anemia: Associated with chronic kidney disease.  Continue to monitor.  H&H stable.  History of depression/schizophrenia: Continue current medications  Diabetes type 2: Continue sliding insulin and  Lantus  Bilateral lower extremity pain: Most likely neuropathic.  Complains of burning pain.  Continue gabapentin at current dose  Debility/deconditioning: Seen by PT/OT and recommended skilled nursing facility on discharge.    Nutrition Problem: Increased nutrient needs Etiology: acute illness(AKI superimposed on CKD requiring HD)      DVT prophylaxis: Eliquis Code Status: Full Family Communication: None present at the bedside Disposition Plan: Patient is from group home.  She is not clinically stable for discharge because she is planned for dialysis /PT/  OT has recommended skilled facility.  When medically ready, she will be discharged to nursing facility with dialysis.  Consultants: Nephrology Procedures: Intubation/extubation/central line placement.  Antimicrobials:  Anti-infectives (From admission, onward)   Start     Dose/Rate Route Frequency Ordered Stop   09/26/19 0600  vancomycin (VANCOCIN) IVPB 1000 mg/200 mL premix     1,000 mg 200 mL/hr over 60 Minutes Intravenous To Surgery 09/25/19 1028 09/27/19 0600   09/14/19 1500  meropenem (MERREM) 500 mg in sodium chloride 0.9 % 100 mL IVPB  Status:  Discontinued     500 mg 200 mL/hr over 30 Minutes Intravenous Every 24 hours 09/14/19 1345 09/17/19 [id number]  09/14/19 1400  vancomycin (VANCOREADY) IVPB 2000 mg/400 mL     2,000 mg 200  mL/hr over 120 Minutes Intravenous  Once 09/14/19 1328 09/14/19 1832   09/14/19 1346  vancomycin variable dose per unstable renal function (pharmacist dosing)  Status:  Discontinued      Does not apply See admin instructions 09/14/19 1346 09/16/19 0929   09/14/19 0600  vancomycin (VANCOCIN) IVPB 1000 mg/200 mL premix  Status:  Discontinued     1,000 mg 200 mL/hr over 60 Minutes Intravenous On call to O.R. 09/13/19 1634 09/14/19 1219   09/11/19 1100  levofloxacin (LEVAQUIN) IVPB 500 mg  Status:  Discontinued     500 mg 100 mL/hr over 60 Minutes Intravenous Every 48 hours 09/09/19 1041 09/14/19 1259   09/09/19 1045  levofloxacin (LEVAQUIN) IVPB 750 mg     750 mg 100 mL/hr over 90 Minutes Intravenous  Once 09/09/19 1041 09/09/19 2208      Subjective:  Patient seen and examined at the bedside this morning.  Hemodynamically stable.  She was crying this morning and complaining of bilateral lower extremity pain near her ankles.  She describes the pain as burning.  Objective: Vitals:   09/25/19 0507 09/25/19 0825 09/25/19 0857 09/25/19 1218  BP: (!) 163/64  140/62 (!) 110/52  Pulse: 70  76 78  Resp: 18  16 18   Temp: 97.9 F (36.6 C)  97.9 F (36.6 C) 98.1 F (36.7 C)  TempSrc: Oral  Oral Oral  SpO2: 98% 98% 98% 94%  Weight:      Height:        Intake/Output Summary (Last 24 hours) at 09/25/2019 1356 Last data filed at 09/25/2019 0600 Gross per 24 hour  Intake 777 ml  Output 850 ml  Net -73 ml   Filed Weights   09/22/19 2300 09/23/19 2031 09/24/19 0500  Weight: 87.3 kg 87.3 kg 87.3 kg    Examination:   General exam: Debilitated, deconditioned, in moderate distress due to pain on her bilateral lower extremities Respiratory system: Diminished air sounds on the bases, no wheezes or crackles Cardiovascular system: S1 & S2 heard, RRR. No JVD, murmurs, rubs, gallops or clicks. Gastrointestinal system: Abdomen is nondistended, soft and nontender. No organomegaly or masses felt.  Normal bowel sounds heard. Central nervous system: Alert and oriented. No focal neurological deficits. Extremities: Trace bilateral lower extremity edema, no clubbing ,no cyanosis Skin: No rashes, lesions or ulcers,no icterus ,no pallor    Data Reviewed: I have personally reviewed following labs and imaging studies  CBC: Recent Labs  Lab 09/21/19 1409 09/24/19 0455  WBC 10.7* 9.9  HGB 9.6* 8.4*  HCT 29.4* 25.8*  MCV 98.7 100.0  PLT 261 [id number]   Basic Metabolic Panel: Recent Labs  Lab 09/21/19 0252 09/22/19 0417 09/23/19 0941 09/24/19 0455 09/25/19 0417  NA 143 142 142 138 137  K 3.6 3.5 3.6 4.1 4.2  CL 104 106 106 100 101  CO2 26 27 25 25 24   GLUCOSE 89 156* 212* 146* 156*  BUN 74* 73* 75* 76* 75*  CREATININE 3.86* 4.59* 5.15* 5.43* 5.71*  CALCIUM 8.8* 8.8* 8.4* 8.5* 8.5*  PHOS 2.8 4.3 4.4 4.5 4.2   GFR: Estimated Creatinine Clearance: 9.8 mL/min (A) (by C-G formula based on SCr of 5.71 mg/dL (H)). Liver Function Tests: Recent Labs  Lab 09/21/19 0252 09/22/19 0417 09/23/19 0941 09/24/19 0455 09/25/19 0417  ALBUMIN 2.0* 2.1* 2.0* 2.0* 2.0*   No results for input(s): LIPASE, AMYLASE in the last 168  hours. No results for input(s): AMMONIA in the last 168 hours. Coagulation Profile: No results for input(s): INR, PROTIME in the last 168 hours. Cardiac Enzymes: No results for input(s): CKTOTAL, CKMB, CKMBINDEX, TROPONINI in the last 168 hours. BNP (last 3 results) No results for input(s): PROBNP in the last 8760 hours. HbA1C: No results for input(s): HGBA1C in the last 72 hours. CBG: Recent Labs  Lab 09/24/19 1154 09/24/19 1633 09/24/19 2055 09/25/19 0726 09/25/19 1117  GLUCAP 281* 304* 286* 162* 186*   Lipid Profile: No results for input(s): CHOL, HDL, LDLCALC, TRIG, CHOLHDL, LDLDIRECT in the last 72 hours. Thyroid Function Tests: No results for input(s): TSH, T4TOTAL, FREET4, T3FREE, THYROIDAB in the last 72 hours. Anemia Panel: No results for  input(s): VITAMINB12, FOLATE, FERRITIN, TIBC, IRON, RETICCTPCT in the last 72 hours. Sepsis Labs: No results for input(s): PROCALCITON, LATICACIDVEN in the last 168 hours.  No results found for this or any previous visit (from the past 240 hour(s)).       Radiology Studies: No results found.      Scheduled Meds: . apixaban  2.5 mg Oral BID  . aspirin  81 mg Oral q morning - 10a  . atorvastatin  20 mg Oral q1800  . calcitRIOL  0.25 mcg Oral Q M,W,F  . carvedilol  25 mg Oral BID WC  . Chlorhexidine Gluconate Cloth  6 each Topical Q0600  . cholecalciferol  4,000 Units Oral q morning - 10a  . darbepoetin (ARANESP) injection - NON-DIALYSIS  150 mcg Subcutaneous Q Thu-1800  . docusate  100 mg Oral BID  . feeding supplement (NEPRO CARB STEADY)  237 mL Oral BID BM  . fentaNYL (SUBLIMAZE) injection  25 mcg Intravenous Once  . furosemide  40 mg Oral Daily  . gabapentin  100 mg Oral TID  . hydrALAZINE  100 mg Oral Q8H  . influenza vaccine adjuvanted  0.5 mL Intramuscular Tomorrow-1000  . insulin aspart  0-9 Units Subcutaneous TID WC  . insulin glargine  12 Units Subcutaneous QHS  . levothyroxine  25 mcg Oral Q0600  . mouth rinse  15 mL Mouth Rinse BID  . montelukast  10 mg Oral QHS  . multivitamin  15 mL Oral Daily  . neomycin-bacitracin-polymyxin  1 application Topical Daily  . pantoprazole  40 mg Oral Daily  . predniSONE  10 mg Oral Q breakfast  . primidone  50 mg Oral q morning - 10a  . pyridOXINE  200 mg Oral BID  . risperiDONE  0.5 mg Oral BID  . sodium chloride flush  3 mL Intravenous Q12H  . traZODone  50 mg Oral Daily   Continuous Infusions: . sodium chloride    . sodium chloride    . sodium chloride    . sodium chloride Stopped (09/21/19 1105)  . sodium chloride    . sodium chloride    . [START ON 09/26/2019] vancomycin       LOS: 19 days    Time spent: 25 mins. More than 50% of that time was spent in counseling and/or coordination of care.      Shelly Coss, MD [facility] P2/04/2020, 1:56 PM

## 2019-09-25 NOTE — Procedures (Signed)
Patient seen on Hemodialysis. BP (!) 115/56   Pulse 77   Temp 98.1 F (36.7 C) (Oral)   Resp 18   Ht 5\' 2"  (1.575 m)   Wt 87.3 kg   SpO2 94%   BMI 35.20 kg/m   QB 400, UF goal 1.5L Tolerating treatment without complaints at this time.   Elmarie Shiley MD [facility]. Office # [phone] Pager # [phone] 4:33 PM

## 2019-09-25 NOTE — Progress Notes (Signed)
Patient while trying to move her in bed had a fixed stare, not responding to verbal commands, took VS, called rapid response and the MD. Fixed stare and crossed eyes on an off for about 10-15 min.  Gave Haldol IV and suddenly started to talk and respond.  Notified about the situation to oncoming nurse, will continue to monitor.

## 2019-09-25 NOTE — Progress Notes (Signed)
Attempted to see pt this am. Pt not feeling well.  Nursing stated pt has spent morning crying with pain in LEs. Pt not resting. Will attempt back as schedule allows. Jinger Neighbors, [place] [phone]

## 2019-09-26 DIAGNOSIS — N179 Acute kidney failure, unspecified: Secondary | ICD-10-CM | POA: Diagnosis not present

## 2019-09-26 DIAGNOSIS — N189 Chronic kidney disease, unspecified: Secondary | ICD-10-CM | POA: Diagnosis not present

## 2019-09-26 DIAGNOSIS — R0602 Shortness of breath: Secondary | ICD-10-CM | POA: Diagnosis not present

## 2019-09-26 DIAGNOSIS — E8779 Other fluid overload: Secondary | ICD-10-CM | POA: Diagnosis not present

## 2019-09-26 LAB — RENAL FUNCTION PANEL
Albumin: 2.1 g/dL — ABNORMAL LOW (ref 3.5–5.0)
Anion gap: 9 (ref 5–15)
BUN: 33 mg/dL — ABNORMAL HIGH (ref 8–23)
CO2: 26 mmol/L (ref 22–32)
Calcium: 8.5 mg/dL — ABNORMAL LOW (ref 8.9–10.3)
Chloride: 107 mmol/L (ref 98–111)
Creatinine, Ser: 3.39 mg/dL — ABNORMAL HIGH (ref 0.44–1.00)
GFR calc Af Amer: 15 mL/min — ABNORMAL LOW (ref >60)
GFR calc non Af Amer: 13 mL/min — ABNORMAL LOW (ref >60)
Glucose, Bld: 104 mg/dL — ABNORMAL HIGH (ref 70–99)
Phosphorus: 3.8 mg/dL (ref 2.5–4.6)
Potassium: 3.8 mmol/L (ref 3.5–5.1)
Sodium: 142 mmol/L (ref 135–145)

## 2019-09-26 LAB — GLUCOSE, CAPILLARY
Glucose-Capillary: 140 mg/dL — ABNORMAL HIGH (ref 70–99)
Glucose-Capillary: 148 mg/dL — ABNORMAL HIGH (ref 70–99)
Glucose-Capillary: 248 mg/dL — ABNORMAL HIGH (ref 70–99)
Glucose-Capillary: 243 mg/dL — ABNORMAL HIGH (ref 70–99)

## 2019-09-26 LAB — SURGICAL PCR SCREEN
MRSA, PCR: NEGATIVE
Staphylococcus aureus: NEGATIVE

## 2019-09-26 LAB — MAGNESIUM: Magnesium: 2.2 mg/dL (ref 1.7–2.4)

## 2019-09-26 MED ORDER — VANCOMYCIN HCL IN DEXTROSE 1-5 GM/200ML-% IV SOLN
1000.0000 mg | INTRAVENOUS | Status: AC
  Filled 2019-09-26: qty 200

## 2019-09-26 MED ORDER — MUPIROCIN 2 % EX OINT
1.0000 "application " | TOPICAL_OINTMENT | Freq: Two times a day (BID) | CUTANEOUS | Status: DC
  Administered 2019-09-27 – 2019-09-30 (×6): 1 via NASAL
  Filled 2019-09-26 (×4): qty 22

## 2019-09-26 NOTE — Plan of Care (Signed)
°  Problem: Coping: °Goal: Level of anxiety will decrease °Outcome: Progressing °  °

## 2019-09-26 NOTE — Progress Notes (Signed)
Notified by telemetry that pt. run 6 beats of VTACH. Checked pt. was asleep. Vital signs taken, stable. Pt. denies complaints. textpaged and notified NP Denny,M and made aware.

## 2019-09-26 NOTE — TOC Progression Note (Addendum)
Transition of Care [facility]) - Progression Note    Patient Details  Name: [name] MRN: [id number] Date of Birth: [birth year]  Transition of Care [facility]) CM/SW Contact  Sharlet Salina Mila Homer, LCSW Phone Number: 09/26/2019, 8:50 AM  Clinical Narrative:  CSW received a call from [place] social worker Betsey Amen regarding patient. Per Ms. [place] does not have Guardianship of patient, and they have not filed for guardianship. She reported that they were contacted when patient was on the vent and could not make decisions. Ms. March Rummage indicated that patient is competent, just mentally ill. CSW advised that patient has no family as all of her siblings are deceased, and she was never married and has no children.       Barriers to Discharge: Continued Medical Work up  Expected Discharge Plan and Services   In-house Referral: Clinical Social Work     Living arrangements for the past 2 months: Group Home                                     Social Determinants of Health (SDOH) Interventions    Readmission Risk Interventions Readmission Risk Prevention Plan 09/10/2019 11/29/2018  Transportation Screening Complete Complete  Medication Review (RN Care Manager) Complete Complete  PCP or Specialist appointment within 3-5 days of discharge - Not Complete  PCP/Specialist Appt Not Complete comments - earliest appt available was 12/08/18  [place] or [place] - Complete  SW Recovery Care/Counseling Consult - Complete  [place] - Not Applicable  Some recent data might be hidden

## 2019-09-26 NOTE — Progress Notes (Signed)
Physical Therapy Treatment Patient Details Name: [name] MRN: [id number] DOB: [birth year] Today's Date: 09/26/2019    History of Present Illness 69 y.o. female   HTN, DM (on trulicity), hyperlipidemia, diastolic heart failure,  schizoaffective D/O with episodes of catatonia and sz d/o, COPD, CKD admitted with progressive wt gain, leg swelling sob with w/u c/w anasarca / bilateral effusions  more sob 1/23 and required intubation / mech with CTa neg PE, positive for mild bilateral pleural effusions with small lung voumes.  Extubated 1/29, then re-intubated 1/31 due to stridor, extubated 2/4.    PT Comments    Pt was seen for mobility of transfer to bed after being up in chair for 2 hours.  Has been fairly unaware of her limitations on LLE with HD cath, and had to remind her not to flex L hip in bed while doing ex's on RLE.  Pt will be seen for strengthening as tolerated and with limitations given by MD to avoid gait but also stress to L HD cath until new site is hopefully successfully completed tomorrow.   Follow Up Recommendations  SNF     Equipment Recommendations  None recommended by PT    Recommendations for Other Services       Precautions / Restrictions Precautions Precautions: Fall Precaution Comments: bed to chair transfers only Restrictions Weight Bearing Restrictions: No Other Position/Activity Restrictions: L femoral HD catheter (non-tunneled) but Dr. Carolin Sicks approved OOB to chair with caution to watch catheter    Mobility  Bed Mobility Overal bed mobility: Needs Assistance Bed Mobility: Sit to Supine     Supine to sit: HOB elevated;Min assist Sit to supine: Min assist   General bed mobility comments: cues for body  mechanics  Transfers Overall transfer level: Needs assistance Equipment used: Rolling walker (2 wheeled) Transfers: Sit to/from Stand Sit to Stand: Min assist;From elevated surface         General transfer comment: cues to protect her HD  catheter  Ambulation/Gait                 Stairs             Wheelchair Mobility    Modified Rankin (Stroke Patients Only)       Balance Overall balance assessment: Needs assistance Sitting-balance support: Feet supported Sitting balance-Leahy Scale: Fair   Postural control: Right lateral lean Standing balance support: Bilateral upper extremity supported;During functional activity Standing balance-Leahy Scale: Poor Standing balance comment: UE support and assist for balance for hygiene                            Cognition Arousal/Alertness: Awake/alert Behavior During Therapy: WFL for tasks assessed/performed Overall Cognitive Status: No family/caregiver present to determine baseline cognitive functioning                                 General Comments: cognitive deficits at baseline; appreciative of therapy session      Exercises General Exercises - Lower Extremity Ankle Circles/Pumps: AROM;Both;5 reps Quad Sets: AROM;Both;10 reps Heel Slides: AROM;Right;10 reps Hip ABduction/ADduction: AROM;Right;10 reps Hip Flexion/Marching: AROM;Right;10 reps    General Comments General comments (skin integrity, edema, etc.): pt was incontinent earlier and continued to be so when PT arrived, but assisted her to get cleaned up before returning to bed      Pertinent Vitals/Pain Pain Assessment: No/denies pain    Home  Living                      Prior Function            PT Goals (current goals can now be found in the care plan section) Acute Rehab PT Goals Patient Stated Goal: to get stronger Progress towards PT goals: Progressing toward goals    Frequency    Min 3X/week      PT Plan Current plan remains appropriate    Co-evaluation              AM-PAC PT "6 Clicks" Mobility   Outcome Measure  Help needed turning from your back to your side while in a flat bed without using bedrails?: None Help needed  moving from lying on your back to sitting on the side of a flat bed without using bedrails?: A Little Help needed moving to and from a bed to a chair (including a wheelchair)?: A Little Help needed standing up from a chair using your arms (e.g., wheelchair or bedside chair)?: A Little Help needed to walk in hospital room?: A Lot Help needed climbing 3-5 steps with a railing? : A Lot 6 Click Score: 17    End of Session Equipment Utilized During Treatment: Gait belt Activity Tolerance: Patient limited by fatigue;Treatment limited secondary to medical complications (Comment) Patient left: in bed;with call bell/phone within reach;with bed alarm set Nurse Communication: Mobility status PT Visit Diagnosis: Other abnormalities of gait and mobility (R26.89);Muscle weakness (generalized) (M62.81)     Time: [id number] PT Time Calculation (min) (ACUTE ONLY): 25 min  Charges:  $Therapeutic Exercise: 8-22 mins $Therapeutic Activity: 8-22 mins                    Ramond Dial 09/26/2019, 12:42 PM   Mee Hives, PT MS Acute Rehab Dept. Number: [place] [facility] [place]

## 2019-09-26 NOTE — Progress Notes (Signed)
PROGRESS NOTE    [name]  [id number] DOB: 1951-02-15 DOA: 09/06/2019 PCP: Glenda Chroman, MD   Brief Narrative: Patient is a 69 year old female from group home with history of chronic kidney disease, COPD, diastolic CHF who presented with shortness of breath, generalized edema.  She was found to have fluid overload on presentation.  She was on acute hypoxic respiratpry failure due to pulmonary edema and had to be intubated.  Currently extubated and respiratory status stable.  Current issue is AKI on CKD which is likely most likely  progression of her underlying renal disease. Now she is in ESRD. Nephrology is closely following. Initiated hemodialysis on 09/25/2019.  Plan for tunnel catheter and dialysis access placement by vascular surgery on 09/27/19.  Assessment & Plan:   Principal Problem:   Acute kidney injury superimposed on chronic kidney disease ([place]) Active Problems:   Type 2 diabetes mellitus with diabetic polyneuropathy, without long-term current use of insulin (HCC)   Normocytic anemia   Schizophrenia (HCC)   Depression   Essential hypertension   Chronic diastolic heart failure (HCC)   CKD (chronic kidney disease), stage IV (HCC)   Obesity, Class III, BMI 40-49.9 (morbid obesity) ([place])   Type 2 diabetes mellitus (HCC)   Hypothyroidism   Volume overload   On mechanically assisted ventilation (HCC)   Acute metabolic encephalopathy   Acute respiratory failure with hypoxia and hypercapnia (HCC)   SVT (supraventricular tachycardia) (HCC)   Acute pulmonary edema (HCC)   ESRD: Nephrology following.Initiated hemodialysis on 09/25/2019.  Plan for tunnel catheter and dialysis access placement by vascular surgery on 09/27/19. She has good urine output.  Acute hypoxic/hypercarbic respiratory failure: Present on admission.  Presented with anasarca.  She was intubated  on presentation.  Currently extubated and respiratory status stable.  Continue BiPAP at night.  Diastolic  CHF/anasarca/pleural effusion: Diuretics discontinued.  Anticipate further improvement after hemodialysis.  Acute metabolic encephalopathy: Secondary to hypercarbia on presentation.  Resolved  Paroxysmal A. [id number] score 5.  On Eliquis 2.5 mg bID  Moderate protein calorie moderation: Dietitian following  Hypertension: Currently blood pressure stable.  Continue current regimen.  Hypothyroidism: Continue Synthyroid  Normocytic anemia: Associated with chronic kidney disease.  Continue to monitor.  H&H stable.  History of depression/schizophrenia: Continue current medications  Diabetes type 2: Continue current insulin regimen.  Monitor CBGs  Bilateral lower extremity pain: Most likely neuropathic.  Better now.  Continue gabapentin at current dose  Debility/deconditioning: Seen by PT/OT and recommended skilled nursing facility on discharge.    Nutrition Problem: Increased nutrient needs Etiology: acute illness(AKI superimposed on CKD requiring HD)      DVT prophylaxis: Eliquis Code Status: Full Family Communication: None present at the bedside Disposition Plan: Patient is from group home.  She is not clinically stable for discharge because she is planned for  Tunnel catheter placement./PT/  OT has recommended skilled facility.  When medically ready, she will be discharged to nursing facility with dialysis.  Consultants: Nephrology Procedures: Intubation/extubation/central line placement.  Antimicrobials:  Anti-infectives (From admission, onward)   Start     Dose/Rate Route Frequency Ordered Stop   09/27/19 0600  vancomycin (VANCOCIN) IVPB 1000 mg/200 mL premix     1,000 mg 200 mL/hr over 60 Minutes Intravenous To Surgery 09/26/19 0834 09/28/19 0600   09/26/19 0600  vancomycin (VANCOCIN) IVPB 1000 mg/200 mL premix  Status:  Discontinued     1,000 mg 200 mL/hr over 60 Minutes Intravenous To Surgery 09/25/19 1028 09/26/19 0843   09/14/19  1500  meropenem (MERREM) 500 mg  in sodium chloride 0.9 % 100 mL IVPB  Status:  Discontinued     500 mg 200 mL/hr over 30 Minutes Intravenous Every 24 hours 09/14/19 1345 09/17/19 0821   09/14/19 1400  vancomycin (VANCOREADY) IVPB 2000 mg/400 mL     2,000 mg 200 mL/hr over 120 Minutes Intravenous  Once 09/14/19 1328 09/14/19 1832   09/14/19 1346  vancomycin variable dose per unstable renal function (pharmacist dosing)  Status:  Discontinued      Does not apply See admin instructions 09/14/19 1346 09/16/19 0929   09/14/19 0600  vancomycin (VANCOCIN) IVPB 1000 mg/200 mL premix  Status:  Discontinued     1,000 mg 200 mL/hr over 60 Minutes Intravenous On call to O.R. 09/13/19 1634 09/14/19 1219   09/11/19 1100  levofloxacin (LEVAQUIN) IVPB 500 mg  Status:  Discontinued     500 mg 100 mL/hr over 60 Minutes Intravenous Every 48 hours 09/09/19 1041 09/14/19 1259   09/09/19 1045  levofloxacin (LEVAQUIN) IVPB 750 mg     750 mg 100 mL/hr over 90 Minutes Intravenous  Once 09/09/19 1041 09/09/19 2208      Subjective:  Patient seen and examined at the bedside this morning.  Hemodynamically stable.  Pain on her bilateral feet have improved today.  She is little anxious about the dialysis catheter placement tomorrow.  Objective: Vitals:   09/25/19 2222 09/26/19 0525 09/26/19 0640 09/26/19 0800  BP:  (!) 137/56 (!) 125/54 (!) 131/56  Pulse: 73 68 75 74  Resp: 16 18 16 16   Temp:  98.7 F (37.1 C) 98.1 F (36.7 C) 97.8 F (36.6 C)  TempSrc:  Oral Oral Oral  SpO2: 97% 98% 96% 99%  Weight:      Height:        Intake/Output Summary (Last 24 hours) at 09/26/2019 1103 Last data filed at 09/26/2019 1000 Gross per 24 hour  Intake 560 ml  Output 2465 ml  Net -1905 ml   Filed Weights   09/23/19 2031 09/24/19 0500 09/25/19 1648  Weight: 87.3 kg 87.3 kg 90.8 kg    Examination:  General exam: Debilitated, deconditioned, chronically looking Respiratory system: no wheezes or crackles  Cardiovascular system: S1 & S2 heard, RRR.  No JVD, murmurs, rubs, gallops or clicks. Gastrointestinal system: Abdomen is nondistended, soft and nontender. No organomegaly or masses felt. Normal bowel sounds heard. Central nervous system: Alert and oriented. No focal neurological deficits. Extremities: No edema, no clubbing ,no cyanosis Skin: No rashes, lesions or ulcers,no icterus ,no pallor   Data Reviewed: I have personally reviewed following labs and imaging studies  CBC: Recent Labs  Lab 09/21/19 1409 09/24/19 0455  WBC 10.7* 9.9  HGB 9.6* 8.4*  HCT 29.4* 25.8*  MCV 98.7 100.0  PLT 261 [id number]   Basic Metabolic Panel: Recent Labs  Lab 09/22/19 0417 09/23/19 0941 09/24/19 0455 09/25/19 0417 09/26/19 0548  NA 142 142 138 137 142  K 3.5 3.6 4.1 4.2 3.8  CL 106 106 100 101 107  CO2 27 25 25 24 26   GLUCOSE 156* 212* 146* 156* 104*  BUN 73* 75* 76* 75* 33*  CREATININE 4.59* 5.15* 5.43* 5.71* 3.39*  CALCIUM 8.8* 8.4* 8.5* 8.5* 8.5*  MG  --   --   --   --  2.2  PHOS 4.3 4.4 4.5 4.2 3.8   GFR: Estimated Creatinine Clearance: 16.9 mL/min (A) (by C-G formula based on SCr of 3.39 mg/dL (H)). Liver Function Tests:  Recent Labs  Lab 09/22/19 0417 09/23/19 0941 09/24/19 0455 09/25/19 0417 09/26/19 0548  ALBUMIN 2.1* 2.0* 2.0* 2.0* 2.1*   No results for input(s): LIPASE, AMYLASE in the last 168 hours. No results for input(s): AMMONIA in the last 168 hours. Coagulation Profile: No results for input(s): INR, PROTIME in the last 168 hours. Cardiac Enzymes: No results for input(s): CKTOTAL, CKMB, CKMBINDEX, TROPONINI in the last 168 hours. BNP (last 3 results) No results for input(s): PROBNP in the last 8760 hours. HbA1C: No results for input(s): HGBA1C in the last 72 hours. CBG: Recent Labs  Lab 09/25/19 0726 09/25/19 1117 09/25/19 1721 09/25/19 2141 09/26/19 0803  GLUCAP 162* 186* 213* 183* 140*   Lipid Profile: No results for input(s): CHOL, HDL, LDLCALC, TRIG, CHOLHDL, LDLDIRECT in the last 72  hours. Thyroid Function Tests: No results for input(s): TSH, T4TOTAL, FREET4, T3FREE, THYROIDAB in the last 72 hours. Anemia Panel: No results for input(s): VITAMINB12, FOLATE, FERRITIN, TIBC, IRON, RETICCTPCT in the last 72 hours. Sepsis Labs: No results for input(s): PROCALCITON, LATICACIDVEN in the last 168 hours.  No results found for this or any previous visit (from the past 240 hour(s)).       Radiology Studies: No results found.      Scheduled Meds: . apixaban  2.5 mg Oral BID  . aspirin  81 mg Oral q morning - 10a  . atorvastatin  20 mg Oral q1800  . calcitRIOL  0.25 mcg Oral Q M,W,F  . carvedilol  25 mg Oral BID WC  . Chlorhexidine Gluconate Cloth  6 each Topical Q0600  . cholecalciferol  4,000 Units Oral q morning - 10a  . darbepoetin (ARANESP) injection - NON-DIALYSIS  150 mcg Subcutaneous Q Thu-1800  . docusate  100 mg Oral BID  . feeding supplement (NEPRO CARB STEADY)  237 mL Oral BID BM  . fentaNYL (SUBLIMAZE) injection  25 mcg Intravenous Once  . gabapentin  100 mg Oral TID  . hydrALAZINE  100 mg Oral Q8H  . influenza vaccine adjuvanted  0.5 mL Intramuscular Tomorrow-1000  . insulin aspart  0-9 Units Subcutaneous TID WC  . insulin glargine  12 Units Subcutaneous QHS  . levothyroxine  25 mcg Oral Q0600  . mouth rinse  15 mL Mouth Rinse BID  . montelukast  10 mg Oral QHS  . multivitamin  15 mL Oral Daily  . neomycin-bacitracin-polymyxin  1 application Topical Daily  . pantoprazole  40 mg Oral Daily  . predniSONE  10 mg Oral Q breakfast  . primidone  50 mg Oral q morning - 10a  . pyridOXINE  200 mg Oral BID  . risperiDONE  0.5 mg Oral BID  . sodium chloride flush  3 mL Intravenous Q12H  . traZODone  50 mg Oral Daily   Continuous Infusions: . sodium chloride    . sodium chloride    . sodium chloride    . sodium chloride Stopped (09/21/19 1105)  . sodium chloride    . sodium chloride    . [START ON 09/27/2019] vancomycin       LOS: 20 days     Time spent: 25 mins. More than 50% of that time was spent in counseling and/or coordination of care.      Shelly Coss, MD [facility] P2/05/2020, 11:03 AM

## 2019-09-26 NOTE — Progress Notes (Signed)
Occupational Therapy Treatment Patient Details Name: [name] MRN: [id number] DOB: Nov 02, 1950 Today's Date: 09/26/2019    History of present illness 69 y.o. female   HTN, DM (on trulicity), hyperlipidemia, diastolic heart failure,  schizoaffective D/O with episodes of catatonia and sz d/o, COPD, CKD admitted with progressive wt gain, leg swelling sob with w/u c/w anasarca / bilateral effusions  more sob 1/23 and required intubation / mech with CTa neg PE, positive for mild bilateral pleural effusions with small lung voumes.  Extubated 1/29, then re-intubated 1/31 due to stridor, extubated 2/4.   OT comments  Pt making steady progress towards OT goals this session. Pt found to be incontinent of stool upon OT arrival. Assisted pt OOB to chair d/t soiled linens. Overall, pt required min guard- MIN A with RW for limited mobility from bed>chair d/t L femoral HD cath. Pt required total A for posterior peicare. Pt appreciative of session and wishes she could walk more with therapy. DC plan currently remains appropriate, will follow acutely per POC.    Follow Up Recommendations  SNF;Supervision/Assistance - 24 hour    Equipment Recommendations       Recommendations for Other Services      Precautions / Restrictions Precautions Precautions: Fall Restrictions Weight Bearing Restrictions: No Other Position/Activity Restrictions: L femoral HD catheter (non-tunneled) but Dr. Carolin Sicks approved OOB to chair with caution to watch catheter       Mobility Bed Mobility Overal bed mobility: Needs Assistance Bed Mobility: Supine to Sit     Supine to sit: HOB elevated;Min assist     General bed mobility comments: cues for body  mechanics  Transfers Overall transfer level: Needs assistance Equipment used: Rolling walker (2 wheeled) Transfers: Sit to/from Stand Sit to Stand: Min assist;From elevated surface         General transfer comment: MIN A to power into standing with RW     Balance Overall balance assessment: Needs assistance Sitting-balance support: Feet supported Sitting balance-Leahy Scale: Fair     Standing balance support: Bilateral upper extremity supported Standing balance-Leahy Scale: Poor Standing balance comment: UE support and assist for balance for hygiene                           ADL either performed or assessed with clinical judgement   ADL Overall ADL's : Needs assistance/impaired             Lower Body Bathing: Maximal assistance;Sit to/from stand   Upper Body Dressing : Minimal assistance;Sitting Upper Body Dressing Details (indicate cue type and reason): to don new hospital gown Lower Body Dressing: Total assistance;Sit to/from stand Lower Body Dressing Details (indicate cue type and reason): to don socks EOB Toilet Transfer: Minimal assistance;Stand-pivot;RW Toilet Transfer Details (indicate cue type and reason): simulated via functional mobility; MIN A for balance for stand pivot only to chair Toileting- Clothing Manipulation and Hygiene: Total assistance;Sit to/from stand Toileting - Clothing Manipulation Details (indicate cue type and reason): total A for posterior pericare after incontinent BM     Functional mobility during ADLs: Minimal assistance;Min guard;Rolling walker General ADL Comments: session focus on simulated toilet transfer from EOB>recliner via stand pivot  with RW and posterior pericare after incontinent BM. pt limited by cognitive deficits, impaired balance and decreased activity tolerance     Vision       Perception     Praxis      Cognition Arousal/Alertness: Awake/alert Behavior During Therapy: [facility] for tasks  assessed/performed Overall Cognitive Status: No family/caregiver present to determine baseline cognitive functioning                                 General Comments: cognitive deficits at baseline; appreciative of therapy session        Exercises      Shoulder Instructions       General Comments pt found to be incontinent of stool with stool around foley cath; RN aware    Pertinent Vitals/ Pain       Pain Assessment: No/denies pain  Home Living                                          Prior Functioning/Environment              Frequency  Min 2X/week        Progress Toward Goals  OT Goals(current goals can now be found in the care plan section)  Progress towards OT goals: Progressing toward goals  Acute Rehab OT Goals Patient Stated Goal: to be stronger OT Goal Formulation: With patient Time For Goal Achievement: 10/13/19 Potential to Achieve Goals: Good  Plan Discharge plan remains appropriate    Co-evaluation                 AM-PAC OT "6 Clicks" Daily Activity     Outcome Measure   Help from another person eating meals?: A Little Help from another person taking care of personal grooming?: A Little Help from another person toileting, which includes using toliet, bedpan, or urinal?: A Lot Help from another person bathing (including washing, rinsing, drying)?: A Lot Help from another person to put on and taking off regular upper body clothing?: A Little Help from another person to put on and taking off regular lower body clothing?: A Lot 6 Click Score: 15    End of Session Equipment Utilized During Treatment: Rolling walker  OT Visit Diagnosis: Unsteadiness on feet (R26.81);Repeated falls (R29.6);Pain Pain - Right/Left: Left Pain - part of body: Leg   Activity Tolerance Patient tolerated treatment well   Patient Left in chair;with call bell/phone within reach;with chair alarm set   Nurse Communication Mobility status;Other (comment)(stool around foley cath; needs full bath today)        Time: [id number] OT Time Calculation (min): 20 min  Charges: OT General Charges $OT Visit: 1 Visit OT Treatments $Self Care/Home Management : 8-22 mins  Lanier Clam., COTA/L Acute  Rehabilitation Services [phone]    Ihor Gully 09/26/2019, 9:50 AM

## 2019-09-26 NOTE — Progress Notes (Signed)
  Speech Language Pathology Treatment: Dysphagia  Patient Details Name: [name] MRN: [id number] DOB: 01/22/1951 Today's Date: 09/26/2019 Time: 1200-1209 SLP Time Calculation (min) (ACUTE ONLY): 9 min  Assessment / Plan / Recommendation Clinical Impression  Pt was encountered awake/alert with recently finished meal tray in front of her.  Pt consumed her entire meal tray and she stated that she did not have any difficulty including coughing or choking during consumption.  Pt's oral cavity was observed to be clear following completion of this meal.  She was agreeable to trials of regular solids (cracker) and thin liquid via cup sip.  She exhibited mildly prolonged, but effective mastication of regular solids and trace oral residue was observed on her lingual surface.  Pt was sensate to residue and cleared it with a cued liquid wash.  No clinical s/sx of aspiration were observed with any trials despite challenging.  Spoke with pt regarding upgrade to regular solids; however, she stated that she preferred to remain on Dysphagia 3 (soft) solids at this time.  Therefore, recommend continuation of Dysphagia 3 solids and thin liquids with intermittent supervision to cue for compensatory strategies.  SLP re-educated pt regarding compensatory strategies and she verbalized and demonstrated understanding.  SLP is signing off at this time, but pt may upgrade to regular solids at a later date per MD discretion if she requests a diet upgrade.  No additional skilled ST is recommended at time of discharge.  Please re-consult if additional needs arise.     HPI HPI: 9 with history of remote smoker, COPD, GERD, dysphagia, fibromyalgia, Barretts esophagus. admitted with progressive wt gain, leg swelling sob with w/u c/w anasarca / bilateral effusions. Placement of HD catheter 1/25. Required intubation 1/23-1/30, reintubated 1/31-2/4. MBS 08/17/18 recommending Dys 3 texture, thin liquids with chin tuck. BSE 09/16/19  recommended continue NPO allowing ice chips and MBS planned 2/1 however pt was reintubated 1/31. CXR Moderate central vascular congestion, pulmonary edema and bilateral pleural effusions with overlying atelectasis, right greater than left.      SLP Plan  All goals met       Recommendations  Diet recommendations: Dysphagia 3 (mechanical soft) Liquids provided via: Cup;Straw Medication Administration: Whole meds with liquid Supervision: Intermittent supervision to cue for compensatory strategies;Patient able to self feed Compensations: Minimize environmental distractions;Slow rate;Small sips/bites Postural Changes and/or Swallow Maneuvers: Seated upright 90 degrees                Oral Care Recommendations: Oral care BID Follow up Recommendations: None SLP Visit Diagnosis: Dysphagia, oropharyngeal phase (R13.12) Plan: All goals met                     Colin Mulders M.S., CCC-SLP Acute Rehabilitation Services Office: [phone]  Elvia Collum [facility] 09/26/2019, 12:11 PM

## 2019-09-26 NOTE — TOC Progression Note (Addendum)
Transition of Care [facility]) - Progression Note    Patient Details  Name: [name] MRN: [id number] Date of Birth: 14-Jan-1951  Transition of Care [facility]) CM/SW Contact  Sharlet Salina Mila Homer, LCSW Phone Number: 09/26/2019, 2:55 PM  Clinical Narrative:  Patient was discussed in LOS meeting and CSW advised to contact admissions staff at [facility] regarding patient, as she is Medicaid only. Call made to Soyla Murphy, admissions director and message left regarding patient. CSW will continue to follow patient's medical status and f/u with admissions staff at [facility].   CSW received call from [place], [place] evaluator regarding patient's level 2 status and needing CSW to assist with Level 2 eval. CSW visited with patient and engaged her briefly in conversation. [name] was sitting up in bed and was awake, alert, pleasant and engaged easily in conversation. When asked, [name] responded that she will be at [facility] group home a year on Feb. 20 th  and she likes it there. Patient added that she likes it there and "Their good to me there". Patient reported that she has a roommate and they get along well and she wouldn't trade her for a million dollars. When asked, patient responded that she does not have any children or family. [name] informed that [place] working on finding a facility for her to go to once medically stable for discharge to get rehab and patient responded that she needs rehab. Follow-up call made to [facility] and the Level 2 evaluation will be done tomorrow with CSW's assistance in going to room and [place] talking with her by phone.    Barriers to Discharge: Continued Medical Work up  Expected Discharge Plan and Services   In-house Referral: Clinical Social Work     Living arrangements for the past 2 months: Group Home                                       Social Determinants of Health (SDOH) Interventions    Readmission Risk Interventions Readmission Risk  Prevention Plan 09/10/2019 11/29/2018  Transportation Screening Complete Complete  Medication Review (RN Care Manager) Complete Complete  PCP or Specialist appointment within 3-5 days of discharge - Not Complete  PCP/Specialist Appt Not Complete comments - earliest appt available was 12/08/18  [place] or [place] - Complete  SW Recovery Care/Counseling Consult - Complete  [place] - Not Applicable  Some recent data might be hidden

## 2019-09-26 NOTE — Progress Notes (Signed)
  Free text Note  Patient NPO after midnight, order for consent obtained for right upper arm arteriovenous graft versus fistula and TDC placement on 09/27/19 by Dr. Jeri Lager, PA-C [facility] [phone] 09/26/2019 8:39 AM

## 2019-09-26 NOTE — Progress Notes (Signed)
Patient ID: [name], female   DOB: 1951/07/01, 69 y.o.   MRN: [id number] [facility] Progress Note   Assessment/ Plan:   1.  End-stage renal disease following dialysis dependent acute kidney injury on chronic kidney disease: She maintains fair urine output with low residual renal function/clearance.  Plans noted by Dr. Donzetta Matters for placement of dialysis access/tunneled catheter tomorrow with discontinuation of femoral catheter.  She does not have acute indications for dialysis today and underwent dialysis yesterday uneventfully-Next dialysis potentially Friday. 2.  Acute hypoxic respiratory failure: Requiring NIPPV nightly, maintains decent oxygenation without supplementation. 3.  Anemia of chronic disease: With acceptable iron saturation, continue high-dose ESA.  No overt loss. 4.  Secondary hyperparathyroidism: Calcium and phosphorus level remain within acceptable range off ferric citrate.  On renal diet. 5.  Hypertension: Blood pressures currently appear to be under good control, discontinue furosemide and monitor with hemodialysis.  Subjective:   Reports to be feeling well today and informs me that "surgery is tomorrow".   Objective:   BP (!) 131/56 (BP Location: Right Arm) Comment: notified RN about Dia  Pulse 74   Temp 97.8 F (36.6 C) (Oral) Comment: Reviewed by Elgie Congo, MSN, RN, PCCN  Resp 16   Ht 5\' 2"  (1.575 m)   Wt 90.8 kg   SpO2 99%   BMI 36.61 kg/m   Intake/Output Summary (Last 24 hours) at 09/26/2019 1042 Last data filed at 09/26/2019 1000 Gross per 24 hour  Intake 560 ml  Output 2465 ml  Net -1905 ml   Weight change:   Physical Exam: Gen: Comfortably sitting up in recliner coloring book CVS: Pulse regular rhythm, normal rate, S1 and S2 normal Resp: Clear to auscultation, no rales/rhonchi Abd: Soft, obese, nontender, bowel sounds normal Ext: Trace lower extremity edema with temporary left femoral dialysis catheter  Imaging: No results  found.  Labs: BMET Recent Labs  Lab 09/20/19 0231 09/21/19 0252 09/22/19 0417 09/23/19 0941 09/24/19 0455 09/25/19 0417 09/26/19 0548  NA 141 143 142 142 138 137 142  K 3.3* 3.6 3.5 3.6 4.1 4.2 3.8  CL 104 104 106 106 100 101 107  CO2 26 26 27 25 25 24 26   GLUCOSE 237* 89 156* 212* 146* 156* 104*  BUN 60* 74* 73* 75* 76* 75* 33*  CREATININE 3.30* 3.86* 4.59* 5.15* 5.43* 5.71* 3.39*  CALCIUM 8.7* 8.8* 8.8* 8.4* 8.5* 8.5* 8.5*  PHOS 3.0 2.8 4.3 4.4 4.5 4.2 3.8   CBC Recent Labs  Lab 09/21/19 1409 09/24/19 0455  WBC 10.7* 9.9  HGB 9.6* 8.4*  HCT 29.4* 25.8*  MCV 98.7 100.0  PLT 261 242   Medications:    . apixaban  2.5 mg Oral BID  . aspirin  81 mg Oral q morning - 10a  . atorvastatin  20 mg Oral q1800  . calcitRIOL  0.25 mcg Oral Q M,W,F  . carvedilol  25 mg Oral BID WC  . Chlorhexidine Gluconate Cloth  6 each Topical Q0600  . cholecalciferol  4,000 Units Oral q morning - 10a  . darbepoetin (ARANESP) injection - NON-DIALYSIS  150 mcg Subcutaneous Q Thu-1800  . docusate  100 mg Oral BID  . feeding supplement (NEPRO CARB STEADY)  237 mL Oral BID BM  . fentaNYL (SUBLIMAZE) injection  25 mcg Intravenous Once  . furosemide  40 mg Oral Daily  . gabapentin  100 mg Oral TID  . hydrALAZINE  100 mg Oral Q8H  . influenza vaccine adjuvanted  0.5 mL  Intramuscular Tomorrow-1000  . insulin aspart  0-9 Units Subcutaneous TID WC  . insulin glargine  12 Units Subcutaneous QHS  . levothyroxine  25 mcg Oral Q0600  . mouth rinse  15 mL Mouth Rinse BID  . montelukast  10 mg Oral QHS  . multivitamin  15 mL Oral Daily  . neomycin-bacitracin-polymyxin  1 application Topical Daily  . pantoprazole  40 mg Oral Daily  . predniSONE  10 mg Oral Q breakfast  . primidone  50 mg Oral q morning - 10a  . pyridOXINE  200 mg Oral BID  . risperiDONE  0.5 mg Oral BID  . sodium chloride flush  3 mL Intravenous Q12H  . traZODone  50 mg Oral Daily   Elmarie Shiley, MD 09/26/2019, 10:42 AM

## 2019-09-27 ENCOUNTER — Inpatient Hospital Stay (HOSPITAL_COMMUNITY): Payer: Medicare Other

## 2019-09-27 ENCOUNTER — Inpatient Hospital Stay (HOSPITAL_COMMUNITY): Payer: Medicare Other | Admitting: Certified Registered Nurse Anesthetist

## 2019-09-27 ENCOUNTER — Encounter (HOSPITAL_COMMUNITY): Payer: Self-pay | Admitting: Internal Medicine

## 2019-09-27 ENCOUNTER — Encounter (HOSPITAL_COMMUNITY)
Admission: EM | Disposition: A | Discharge status: Skilled Nursing Facility | Payer: Self-pay | Source: Skilled Nursing Facility | Attending: Internal Medicine

## 2019-09-27 DIAGNOSIS — Z20822 Contact with and (suspected) exposure to covid-19: Secondary | ICD-10-CM | POA: Diagnosis not present

## 2019-09-27 DIAGNOSIS — E8779 Other fluid overload: Secondary | ICD-10-CM | POA: Diagnosis not present

## 2019-09-27 DIAGNOSIS — J9602 Acute respiratory failure with hypercapnia: Secondary | ICD-10-CM | POA: Diagnosis not present

## 2019-09-27 DIAGNOSIS — N179 Acute kidney failure, unspecified: Secondary | ICD-10-CM | POA: Diagnosis not present

## 2019-09-27 DIAGNOSIS — N185 Chronic kidney disease, stage 5: Secondary | ICD-10-CM | POA: Diagnosis not present

## 2019-09-27 DIAGNOSIS — R0602 Shortness of breath: Secondary | ICD-10-CM | POA: Diagnosis not present

## 2019-09-27 DIAGNOSIS — J9601 Acute respiratory failure with hypoxia: Secondary | ICD-10-CM | POA: Diagnosis not present

## 2019-09-27 DIAGNOSIS — N189 Chronic kidney disease, unspecified: Secondary | ICD-10-CM | POA: Diagnosis not present

## 2019-09-27 HISTORY — PX: INSERTION OF DIALYSIS CATHETER: SHX1324

## 2019-09-27 HISTORY — PX: AV FISTULA PLACEMENT: SHX1204

## 2019-09-27 LAB — RENAL FUNCTION PANEL
Albumin: 2.1 g/dL — ABNORMAL LOW (ref 3.5–5.0)
Anion gap: 14 (ref 5–15)
BUN: 45 mg/dL — ABNORMAL HIGH (ref 8–23)
CO2: 27 mmol/L (ref 22–32)
Calcium: 8.8 mg/dL — ABNORMAL LOW (ref 8.9–10.3)
Chloride: 100 mmol/L (ref 98–111)
Creatinine, Ser: 4.58 mg/dL — ABNORMAL HIGH (ref 0.44–1.00)
GFR calc Af Amer: 11 mL/min — ABNORMAL LOW (ref >60)
GFR calc non Af Amer: 9 mL/min — ABNORMAL LOW (ref >60)
Glucose, Bld: 125 mg/dL — ABNORMAL HIGH (ref 70–99)
Phosphorus: 5.3 mg/dL — ABNORMAL HIGH (ref 2.5–4.6)
Potassium: 4.2 mmol/L (ref 3.5–5.1)
Sodium: 141 mmol/L (ref 135–145)

## 2019-09-27 LAB — TYPE AND SCREEN
ABO/RH(D): A POS
Antibody Screen: NEGATIVE

## 2019-09-27 LAB — POCT I-STAT, CHEM 8
BUN: 40 mg/dL — ABNORMAL HIGH (ref 8–23)
Calcium, Ion: 1.24 mmol/L (ref 1.15–1.40)
Chloride: 101 mmol/L (ref 98–111)
Creatinine, Ser: 4.7 mg/dL — ABNORMAL HIGH (ref 0.44–1.00)
Glucose, Bld: 105 mg/dL — ABNORMAL HIGH (ref 70–99)
HCT: 23 % — ABNORMAL LOW (ref 36.0–46.0)
Hemoglobin: 7.8 g/dL — ABNORMAL LOW (ref 12.0–15.0)
Potassium: 4.1 mmol/L (ref 3.5–5.1)
Sodium: 138 mmol/L (ref 135–145)
TCO2: 28 mmol/L (ref 22–32)

## 2019-09-27 LAB — GLUCOSE, CAPILLARY
Glucose-Capillary: 96 mg/dL (ref 70–99)
Glucose-Capillary: 121 mg/dL — ABNORMAL HIGH (ref 70–99)
Glucose-Capillary: 164 mg/dL — ABNORMAL HIGH (ref 70–99)
Glucose-Capillary: 238 mg/dL — ABNORMAL HIGH (ref 70–99)
Glucose-Capillary: 197 mg/dL — ABNORMAL HIGH (ref 70–99)

## 2019-09-27 LAB — ABO/RH: ABO/RH(D): A POS

## 2019-09-27 SURGERY — ARTERIOVENOUS (AV) FISTULA CREATION
Anesthesia: Monitor Anesthesia Care | Site: Chest | Laterality: Right

## 2019-09-27 MED ORDER — FENTANYL CITRATE (PF) 250 MCG/5ML IJ SOLN
INTRAMUSCULAR | Status: AC
  Filled 2019-09-27: qty 5

## 2019-09-27 MED ORDER — HEPARIN SODIUM (PORCINE) 1000 UNIT/ML IJ SOLN
INTRAMUSCULAR | Status: AC
  Filled 2019-09-27: qty 1

## 2019-09-27 MED ORDER — ACETAMINOPHEN 10 MG/ML IV SOLN: 1000.0000 mg | Freq: Once | INTRAVENOUS | Status: DC | PRN

## 2019-09-27 MED ORDER — OXYCODONE HCL 5 MG/5ML PO SOLN: 5.0000 mg | Freq: Once | ORAL | Status: DC | PRN

## 2019-09-27 MED ORDER — MIDAZOLAM HCL 2 MG/2ML IJ SOLN
INTRAMUSCULAR | Status: AC
  Filled 2019-09-27: qty 2

## 2019-09-27 MED ORDER — SODIUM CHLORIDE 0.9 % IV SOLN: INTRAVENOUS | Status: DC | PRN

## 2019-09-27 MED ORDER — SODIUM CHLORIDE 0.9 % IV SOLN
INTRAVENOUS | Status: DC | PRN
  Administered 2019-09-27: 100 mL

## 2019-09-27 MED ORDER — STERILE WATER FOR IRRIGATION IR SOLN
Status: DC | PRN
  Administered 2019-09-27: 1000 mL

## 2019-09-27 MED ORDER — LIDOCAINE 2% (20 MG/ML) 5 ML SYRINGE
INTRAMUSCULAR | Status: AC
  Filled 2019-09-27: qty 5

## 2019-09-27 MED ORDER — HEPARIN SODIUM (PORCINE) 1000 UNIT/ML IJ SOLN
INTRAMUSCULAR | Status: DC | PRN
  Administered 2019-09-27: 3000 [IU]

## 2019-09-27 MED ORDER — OXYCODONE HCL 5 MG PO TABS: 5.0000 mg | ORAL_TABLET | Freq: Once | ORAL | Status: DC | PRN

## 2019-09-27 MED ORDER — PHENYLEPHRINE HCL-NACL 10-0.9 MG/250ML-% IV SOLN
INTRAVENOUS | Status: DC | PRN
  Administered 2019-09-27: 15 ug/min via INTRAVENOUS

## 2019-09-27 MED ORDER — FENTANYL CITRATE (PF) 100 MCG/2ML IJ SOLN: 25.0000 ug | INTRAMUSCULAR | Status: DC | PRN

## 2019-09-27 MED ORDER — LIDOCAINE-EPINEPHRINE 1 %-1:100000 IJ SOLN
INTRAMUSCULAR | Status: AC
  Filled 2019-09-27: qty 1

## 2019-09-27 MED ORDER — VANCOMYCIN HCL IN DEXTROSE 1-5 GM/200ML-% IV SOLN
INTRAVENOUS | Status: AC
  Filled 2019-09-27: qty 200

## 2019-09-27 MED ORDER — LIDOCAINE-EPINEPHRINE 1 %-1:100000 IJ SOLN
INTRAMUSCULAR | Status: DC | PRN
  Administered 2019-09-27: 10 mL
  Administered 2019-09-27: 3 mL

## 2019-09-27 MED ORDER — PROPOFOL 10 MG/ML IV BOLUS
INTRAVENOUS | Status: AC
  Filled 2019-09-27: qty 40

## 2019-09-27 MED ORDER — ACETAMINOPHEN 160 MG/5ML PO SOLN: 1000.0000 mg | Freq: Once | ORAL | Status: DC | PRN

## 2019-09-27 MED ORDER — VANCOMYCIN HCL 1000 MG IV SOLR
INTRAVENOUS | Status: DC | PRN
  Administered 2019-09-27: 1000 mg via INTRAVENOUS

## 2019-09-27 MED ORDER — ACETAMINOPHEN 500 MG PO TABS: 1000.0000 mg | ORAL_TABLET | Freq: Once | ORAL | Status: DC | PRN

## 2019-09-27 MED ORDER — PROPOFOL 500 MG/50ML IV EMUL
INTRAVENOUS | Status: DC | PRN
  Administered 2019-09-27: 50 ug/kg/min via INTRAVENOUS

## 2019-09-27 MED ORDER — FENTANYL CITRATE (PF) 250 MCG/5ML IJ SOLN
INTRAMUSCULAR | Status: DC | PRN
  Administered 2019-09-27: 25 ug via INTRAVENOUS

## 2019-09-27 MED ORDER — SODIUM CHLORIDE 0.9 % IV SOLN
INTRAVENOUS | Status: AC
  Filled 2019-09-27: qty 1.2

## 2019-09-27 MED ORDER — 0.9 % SODIUM CHLORIDE (POUR BTL) OPTIME
TOPICAL | Status: DC | PRN
  Administered 2019-09-27: 1000 mL

## 2019-09-27 MED ORDER — LIDOCAINE HCL (PF) 1 % IJ SOLN
INTRAMUSCULAR | Status: AC
  Filled 2019-09-27: qty 30

## 2019-09-27 MED ORDER — LIDOCAINE 2% (20 MG/ML) 5 ML SYRINGE
INTRAMUSCULAR | Status: DC | PRN
  Administered 2019-09-27: 40 mg via INTRAVENOUS

## 2019-09-27 SURGICAL SUPPLY — 53 items
ADH SKN CLS APL DERMABOND .7 (GAUZE/BANDAGES/DRESSINGS) ×4
ARMBAND PINK RESTRICT EXTREMIT (MISCELLANEOUS) ×3 IMPLANT
BIOPATCH RED 1 DISK 7.0 (GAUZE/BANDAGES/DRESSINGS) ×3 IMPLANT
CANISTER SUCT 3000ML PPV (MISCELLANEOUS) ×3 IMPLANT
CATH PALINDROME RT-P 15FX19CM (CATHETERS) ×1 IMPLANT
CLIP VESOCCLUDE MED 6/CT (CLIP) ×3 IMPLANT
CLIP VESOCCLUDE SM WIDE 6/CT (CLIP) ×3 IMPLANT
COVER PROBE W GEL 5X96 (DRAPES) ×3 IMPLANT
COVER SURGICAL LIGHT HANDLE (MISCELLANEOUS) ×3 IMPLANT
DECANTER SPIKE VIAL GLASS SM (MISCELLANEOUS) ×2 IMPLANT
DERMABOND ADVANCED (GAUZE/BANDAGES/DRESSINGS) ×2
DERMABOND ADVANCED .7 DNX12 (GAUZE/BANDAGES/DRESSINGS) ×2 IMPLANT
DRAPE C-ARM 42X72 X-RAY (DRAPES) ×3 IMPLANT
DRAPE ORTHO SPLIT 77X108 STRL (DRAPES) ×3
DRAPE SURG ORHT 6 SPLT 77X108 (DRAPES) IMPLANT
ELECT CAUTERY BLADE 6.4 (BLADE) ×1 IMPLANT
ELECT REM PT RETURN 9FT ADLT (ELECTROSURGICAL) ×3
ELECTRODE REM PT RTRN 9FT ADLT (ELECTROSURGICAL) ×2 IMPLANT
GAUZE 4X4 16PLY RFD (DISPOSABLE) ×3 IMPLANT
GLOVE BIO SURGEON STRL SZ7.5 (GLOVE) ×3 IMPLANT
GLOVE BIOGEL PI IND STRL 6.5 (GLOVE) IMPLANT
GLOVE BIOGEL PI IND STRL 7.5 (GLOVE) IMPLANT
GLOVE BIOGEL PI INDICATOR 6.5 (GLOVE) ×3
GLOVE BIOGEL PI INDICATOR 7.5 (GLOVE) ×2
GLOVE ECLIPSE 7.0 STRL STRAW (GLOVE) ×1 IMPLANT
GLOVE SURG SS PI 6.5 STRL IVOR (GLOVE) ×1 IMPLANT
GOWN STRL REUS W/ TWL LRG LVL3 (GOWN DISPOSABLE) ×4 IMPLANT
GOWN STRL REUS W/ TWL XL LVL3 (GOWN DISPOSABLE) ×2 IMPLANT
GOWN STRL REUS W/TWL LRG LVL3 (GOWN DISPOSABLE) ×6
GOWN STRL REUS W/TWL XL LVL3 (GOWN DISPOSABLE) ×3
KIT BASIN OR (CUSTOM PROCEDURE TRAY) ×3 IMPLANT
KIT TURNOVER KIT B (KITS) ×3 IMPLANT
NDL 18GX1X1/2 (RX/OR ONLY) (NEEDLE) ×2 IMPLANT
NDL HYPO 25GX1X1/2 BEV (NEEDLE) ×2 IMPLANT
NEEDLE 18GX1X1/2 (RX/OR ONLY) (NEEDLE) ×3 IMPLANT
NEEDLE HYPO 25GX1X1/2 BEV (NEEDLE) ×3 IMPLANT
NS IRRIG 1000ML POUR BTL (IV SOLUTION) ×3 IMPLANT
PACK CV ACCESS (CUSTOM PROCEDURE TRAY) ×3 IMPLANT
PACK SURGICAL SETUP 50X90 (CUSTOM PROCEDURE TRAY) ×3 IMPLANT
PAD ARMBOARD 7.5X6 YLW CONV (MISCELLANEOUS) ×6 IMPLANT
SOAP 2 % CHG 4 OZ (WOUND CARE) ×3 IMPLANT
SUT ETHILON 3 0 PS 1 (SUTURE) ×3 IMPLANT
SUT MNCRL AB 4-0 PS2 18 (SUTURE) ×4 IMPLANT
SUT PROLENE 6 0 BV (SUTURE) ×3 IMPLANT
SUT VIC AB 3-0 SH 27 (SUTURE) ×3
SUT VIC AB 3-0 SH 27X BRD (SUTURE) ×2 IMPLANT
SYR 10ML LL (SYRINGE) ×3 IMPLANT
SYR 20ML LL LF (SYRINGE) ×6 IMPLANT
SYR 5ML LL (SYRINGE) ×3 IMPLANT
SYR CONTROL 10ML LL (SYRINGE) ×3 IMPLANT
TOWEL GREEN STERILE (TOWEL DISPOSABLE) ×3 IMPLANT
TOWEL GREEN STERILE FF (TOWEL DISPOSABLE) ×5 IMPLANT
WATER STERILE IRR 1000ML POUR (IV SOLUTION) ×3 IMPLANT

## 2019-09-27 NOTE — Transfer of Care (Signed)
Immediate Anesthesia Transfer of Care Note  Patient: [name]  Procedure(s) Performed: ARTERIOVENOUS (AV) FISTULA CREATION (Right Arm Lower) INSERTION OF DIALYSIS CATHETER (Right Chest)  Patient Location: PACU  Anesthesia Type:MAC  Level of Consciousness: awake, alert , oriented and patient cooperative  Airway & Oxygen Therapy: Patient Spontanous Breathing and Patient connected to face mask oxygen  Post-op Assessment: Report given to RN and Post -op Vital signs reviewed and stable  Post vital signs: Reviewed and stable  Last Vitals:  Vitals Value Taken Time  BP 185/69 09/27/19 0856  Temp    Pulse 74 09/27/19 0858  Resp 16 09/27/19 0858  SpO2 98 % 09/27/19 0858  Vitals shown include unvalidated device data.  Last Pain:  Vitals:   09/26/19 2000  TempSrc:   PainSc: 0-No pain      Patients Stated Pain Goal: 0 (47/20/72 1828)  Complications: No apparent anesthesia complications

## 2019-09-27 NOTE — Anesthesia Postprocedure Evaluation (Signed)
Anesthesia Post Note  Patient: [name]  Procedure(s) Performed: ARTERIOVENOUS (AV) FISTULA CREATION (Right Arm Lower) INSERTION OF DIALYSIS CATHETER (Right Chest)     Patient location during evaluation: PACU Anesthesia Type: MAC Level of consciousness: awake and patient cooperative Pain management: pain level controlled Vital Signs Assessment: post-procedure vital signs reviewed and stable Respiratory status: spontaneous breathing, nonlabored ventilation, respiratory function stable and patient connected to nasal cannula oxygen Cardiovascular status: stable and blood pressure returned to baseline Postop Assessment: no apparent nausea or vomiting Anesthetic complications: no    Last Vitals:  Vitals:   09/27/19 0937 09/27/19 0938  BP:  (!) 133/57  Pulse: 66 66  Resp: 20 18  Temp:  36.7 C  SpO2: 95% 95%    Last Pain:  Vitals:   09/27/19 1428  TempSrc:   PainSc: 8                  Kabrea Seeney

## 2019-09-27 NOTE — OR Nursing (Signed)
Pink restricted extremity armband placed on patient's right arm.

## 2019-09-27 NOTE — Anesthesia Procedure Notes (Signed)
Procedure Name: MAC Date/Time: 09/27/2019 7:40 AM Performed by: Colin Benton, CRNA Pre-anesthesia Checklist: Patient identified, Emergency Drugs available, Suction available and Patient being monitored Patient Re-evaluated:Patient Re-evaluated prior to induction Oxygen Delivery Method: Simple face mask Induction Type: IV induction Placement Confirmation: positive ETCO2 Dental Injury: Teeth and Oropharynx as per pre-operative assessment

## 2019-09-27 NOTE — Op Note (Signed)
    Patient name: [name] MRN: [id number] DOB: [birth year] Sex: female  09/27/2019 Pre-operative Diagnosis: End-stage renal disease Post-operative diagnosis:  Same Surgeon:  Erlene Quan C. Donzetta Matters, MD Assistant: Laurence Slate, PA Procedure Performed: 1.  Left IJ 19 cm tunneled dialysis catheter placement with ultrasound guidance  2.  Right arm brachiocephalic AV fistula creation  Indications: 69 year old female now with end-stage renal disease.  She is now indicated for permanent access.  She will also need tunneled catheter.  Findings: Right IJ appeared sclerotic.  Left IJ was large and compressible.  19 cm catheter was placed with ultrasound guidance.  Left cephalic vein measured 4 mm external diameter.  At completion was a strong thrill and a palpable radial pulse the wrist both confirmed with Doppler.   Procedure:  The patient was identified in the holding area and taken to the operating room where she was sterilely prepped and draped in the neck and chest and right upper extremity usual fashion.  Antibiotics were minister timeout was called.  Ultrasound was used to identify her left IJ the left chest was anesthetized 1% lidocaine.  We also identified the cephalic vein at this time in the right arm and anesthetized this area with 1% lidocaine.  I used ultrasound guidance to cannulate the left IJ with 18-gauge needle and a wire was passed centrally.  A 19 cm catheter was tunneled laterally through a counterincision and trimmed to size.  The wire tract was serially dilated the catheter was placed to the SVC atrial junction.  Catheter was assembled flushed with heparinized saline and locked with 1.5 cc of concentrated heparin either port.  Was affixed to the skin with 3-0 nylon suture and 4 Monocryl was used to close the neck site.  Dermabond placed to both sites.  We turned our attention to the right upper extremity.  Transverse incision was made between the palpable brachial artery pulse and the  previously confirmed cephalic vein by ultrasound.  We dissected out the vein marked for orientation.  Branches were taken between clips and ties.  I dissected through the deep fascia of the brachial artery placed a vessel loop around this.  I then transected the vein distally and tied it off.  I spatulated flushed with heparinized saline and clamped.  The artery was clamped distally proximally opened longitudinally flushed with heparinized saline both directions.  We then sewed the vein end-to-side with 6-0 Prolene suture.  Prior to completion the anastomosis we allowed flushing all directions.  Upon completion there was a very strong thrill in the vein.  There was a palpable radial artery pulse at the wrist.  Both these were confirmed with Doppler.  Satisfied we irrigated the wound obtain hemostasis closed in layers with Vicryl and Monocryl.  Dermabond placed to the level the skin.  She is awakened anesthesia having tolerated procedure without immediate complication but all counts were correct at completion.  EBL: 25 cc   Ranette Luckadoo C. Donzetta Matters, MD [facility] [place] Office: [phone] Pager: [phone]

## 2019-09-27 NOTE — Discharge Planning (Signed)
Patient has a seat at [employer]. She is scheduled for MWF 10:45 am. 1st visit she should arrive by 10am. Per Case Manager, SNF placement search is in progress.

## 2019-09-27 NOTE — Anesthesia Preprocedure Evaluation (Signed)
Anesthesia Evaluation  Patient identified by MRN, date of birth, ID band Patient awake    Reviewed: Allergy & Precautions, NPO status , Patient's Chart, lab work & pertinent test results, reviewed documented beta blocker date and time   History of Anesthesia Complications Negative for: history of anesthetic complications  Airway Mallampati: IV  TM Distance: >3 FB Neck ROM: Full    Dental  (+) Dental Advisory Given, Poor Dentition, Chipped, Missing   Pulmonary asthma , sleep apnea and Continuous Positive Airway Pressure Ventilation , COPD, neg recent URI, former smoker, PE S/p intubation for respiratory failure in setting of volume overload for AKI now ESRD on HD and extubated   breath sounds clear to auscultation       Cardiovascular hypertension, Pt. on medications and Pt. on home beta blockers (-) angina+CHF  (-) Past MI + dysrhythmias Atrial Fibrillation  Rhythm:Irregular + Systolic murmurs    Neuro/Psych Seizures -,  PSYCHIATRIC DISORDERS Anxiety Depression Schizophrenia  Neuromuscular disease    GI/Hepatic Neg liver ROS, GERD  ,  Endo/Other  diabetesHypothyroidism   Renal/GU ESRF and DialysisRenal diseaseLast hd 2/9     Musculoskeletal  (+) Arthritis , Fibromyalgia -  Abdominal   Peds  Hematology  (+) anemia ,   Anesthesia Other Findings  1. The left ventricle has normal systolic function of 67-20%. The cavity  size is normal. There is concentric left ventricular wall thickness. Echo  evidence of impaired relaxation diastolic filling patterns. Normal left  ventricular filling pressures.  2. Severely dilated left atrial size.  3. Normal right atrial size.  4. Normal tricuspid valve.  5. Tricuspid regurgitation is mild.  6. The aortic valve tricuspid. There is mild thickening of the aortic  valve.  7. Mild aortic annular calcification.  8. The aortic root is normal is size and structure.  9. No  atrial level shunt detected by color flow Doppler.  Reproductive/Obstetrics                             Anesthesia Physical Anesthesia Plan  ASA: IV  Anesthesia Plan: MAC   Post-op Pain Management:    Induction: Intravenous  PONV Risk Score and Plan: 2 and Treatment may vary due to age or medical condition and Propofol infusion  Airway Management Planned: Nasal Cannula  Additional Equipment: None  Intra-op Plan:   Post-operative Plan:   Informed Consent: I have reviewed the patients History and Physical, chart, labs and discussed the procedure including the risks, benefits and alternatives for the proposed anesthesia with the patient or authorized representative who has indicated his/her understanding and acceptance.     Dental advisory given  Plan Discussed with: CRNA and Surgeon  Anesthesia Plan Comments:         Anesthesia Quick Evaluation

## 2019-09-27 NOTE — Progress Notes (Signed)
Patient still has non-tunneled HD cath in place after tunneled HD cath placed this morning. MD notified about removing non-tunneled line.

## 2019-09-27 NOTE — Progress Notes (Signed)
Patient ID: [name], female   DOB: Oct 23, 1950, 69 y.o.   MRN: [id number] [facility] Progress Note   Assessment/ Plan:   1.  End-stage renal disease following dialysis dependent acute kidney injury on chronic kidney disease: Initiated on hemodialysis and now status post TDC/right BCF.  Process underway for outpatient dialysis unit placement and will order for hemodialysis again tomorrow given fair urine output but limited clearance. 2.  Acute hypoxic respiratory failure: Respiratory status improved with hemodialysis/ultrafiltration. 3.  Anemia of chronic disease: With acceptable iron saturation, continue high-dose ESA.  No overt loss. 4.  Secondary hyperparathyroidism: Calcium and phosphorus level remain within acceptable range off ferric citrate.  On renal diet. 5.  Hypertension: Blood pressures currently appear to be under good control, monitor with hemodialysis.  Subjective:   Denies any current complaints after surgery earlier-underwent placement of left TDC and right BCF.   Objective:   BP (!) 133/57   Pulse 66   Temp 98.1 F (36.7 C)   Resp 18   Ht 5\' 2"  (1.575 m)   Wt 90.8 kg   SpO2 95%   BMI 36.61 kg/m   Intake/Output Summary (Last 24 hours) at 09/27/2019 1024 Last data filed at 09/27/2019 0940 Gross per 24 hour  Intake 895 ml  Output 1190 ml  Net -295 ml   Weight change:   Physical Exam: Gen: Comfortably sitting up in recliner coloring book CVS: Pulse regular rhythm, normal rate, S1 and S2 normal Resp: Clear to auscultation, no rales/rhonchi Abd: Soft, obese, nontender, bowel sounds normal Ext: Trace lower extremity edema with left IJ TDC.  Palpable thrill right BCF  Imaging: DG Chest Port 1V same Day  Result Date: 09/27/2019 CLINICAL DATA:  Postoperative state. EXAM: PORTABLE CHEST 1 VIEW COMPARISON:  09/17/2019 FINDINGS: Left jugular dialysis catheter in place. The catheter tip is in the upper SVC region. Heart size is upper limits of normal  but stable. Improved aeration in both lungs compared to the previous examination. No significant airspace disease. Central vascular structures are mildly enlarged. Negative for a pneumothorax. IMPRESSION: 1. Placement of left jugular dialysis catheter. Catheter tip in the upper SVC region. 2. Mildly prominent central vascular structures without pulmonary edema. Negative for a pneumothorax. Electronically Signed   By: Markus Daft M.D.   On: 09/27/2019 09:42   DG Fluoro Guide CV Line-No Report  Result Date: 09/27/2019 Fluoroscopy was utilized by the requesting physician.  No radiographic interpretation.    Labs: BMET Recent Labs  Lab 09/21/19 0252 09/22/19 0417 09/23/19 0941 09/24/19 0455 09/25/19 0417 09/26/19 0548 09/27/19 0443  NA 143 142 142 138 137 142 141  K 3.6 3.5 3.6 4.1 4.2 3.8 4.2  CL 104 106 106 100 101 107 100  CO2 26 27 25 25 24 26 27   GLUCOSE 89 156* 212* 146* 156* 104* 125*  BUN 74* 73* 75* 76* 75* 33* 45*  CREATININE 3.86* 4.59* 5.15* 5.43* 5.71* 3.39* 4.58*  CALCIUM 8.8* 8.8* 8.4* 8.5* 8.5* 8.5* 8.8*  PHOS 2.8 4.3 4.4 4.5 4.2 3.8 5.3*   CBC Recent Labs  Lab 09/21/19 1409 09/24/19 0455  WBC 10.7* 9.9  HGB 9.6* 8.4*  HCT 29.4* 25.8*  MCV 98.7 100.0  PLT 261 242   Medications:    . apixaban  2.5 mg Oral BID  . aspirin  81 mg Oral q morning - 10a  . atorvastatin  20 mg Oral q1800  . calcitRIOL  0.25 mcg Oral Q M,W,F  . carvedilol  25 mg Oral BID WC  . Chlorhexidine Gluconate Cloth  6 each Topical Q0600  . cholecalciferol  4,000 Units Oral q morning - 10a  . darbepoetin (ARANESP) injection - NON-DIALYSIS  150 mcg Subcutaneous Q Thu-1800  . docusate  100 mg Oral BID  . feeding supplement (NEPRO CARB STEADY)  237 mL Oral BID BM  . fentaNYL (SUBLIMAZE) injection  25 mcg Intravenous Once  . gabapentin  100 mg Oral TID  . hydrALAZINE  100 mg Oral Q8H  . influenza vaccine adjuvanted  0.5 mL Intramuscular Tomorrow-1000  . insulin aspart  0-9 Units  Subcutaneous TID WC  . insulin glargine  12 Units Subcutaneous QHS  . levothyroxine  25 mcg Oral Q0600  . mouth rinse  15 mL Mouth Rinse BID  . montelukast  10 mg Oral QHS  . multivitamin  15 mL Oral Daily  . mupirocin ointment  1 application Nasal BID  . neomycin-bacitracin-polymyxin  1 application Topical Daily  . pantoprazole  40 mg Oral Daily  . predniSONE  10 mg Oral Q breakfast  . primidone  50 mg Oral q morning - 10a  . pyridOXINE  200 mg Oral BID  . risperiDONE  0.5 mg Oral BID  . sodium chloride flush  3 mL Intravenous Q12H  . traZODone  50 mg Oral Daily   Elmarie Shiley, MD 09/27/2019, 10:24 AM

## 2019-09-27 NOTE — TOC Progression Note (Signed)
Transition of Care [facility]) - Progression Note    Patient Details  Name: Reema Throne MRN: [id number] Date of Birth: 1951/03/02  Transition of Care [facility]) CM/SW Contact  Sharlet Salina Mila Homer, LCSW Phone Number: 09/27/2019, 10:46 AM  Clinical Narrative:  Call made to [name], admissions social worker at [facility] on 2/10 regarding patient being discussed in length of stay meeting and CSW being advised to contact her regarding patient's need for ST rehab.  Received call from [name] this morning and CSW informed that they will not be able to accept patient due to her mental illness, not having a decision maker and they are having problems with transportation at this time. [facility] director [employer] updated.      Barriers to Discharge: Continued Medical Work up  Expected Discharge Plan and Services   In-house Referral: Clinical Social Work     Living arrangements for the past 2 months: Group Home                                     Social Determinants of Health (SDOH) Interventions    Readmission Risk Interventions Readmission Risk Prevention Plan 09/10/2019 11/29/2018  Transportation Screening Complete Complete  Medication Review (RN Care Manager) Complete Complete  PCP or Specialist appointment within 3-5 days of discharge - Not Complete  PCP/Specialist Appt Not Complete comments - earliest appt available was 12/08/18  [place] or [place] - Complete  SW Recovery Care/Counseling Consult - Complete  [place] - Not Applicable  Some recent data might be hidden

## 2019-09-27 NOTE — Progress Notes (Signed)
   Patient doing well postop.  Strong thrill right upper extremity and TDC in place without hematoma.  She is scheduled follow-up with me on March 19.  Danuel Felicetti C. Donzetta Matters, MD [facility] [place] Office: [phone] Pager: [phone]

## 2019-09-27 NOTE — Progress Notes (Signed)
PROGRESS NOTE    [name]  [id number] DOB: 19-Jun-1951 DOA: 09/06/2019 PCP: Glenda Chroman, MD   Brief Narrative: Patient is a 69 year old female from group home with history of chronic kidney disease, COPD, diastolic CHF who presented with shortness of breath, generalized edema.  She was found to have fluid overload on presentation.  She was on acute hypoxic respiratpry failure due to pulmonary edema and had to be intubated.  Currently extubated and respiratory status stable.  Current issue is AKI on CKD which is likely most likely  progression of her underlying renal disease. Now she is in ESRD. Nephrology is closely following. Initiated hemodialysis on 09/25/2019.  Underwent  tunnel catheter and dialysis access placement by vascular surgery on 09/27/19.Plan for dialysis tomorrow.  Assessment & Plan:   Principal Problem:   Acute kidney injury superimposed on chronic kidney disease ([place]) Active Problems:   Type 2 diabetes mellitus with diabetic polyneuropathy, without long-term current use of insulin (HCC)   Normocytic anemia   Schizophrenia (HCC)   Depression   Essential hypertension   Chronic diastolic heart failure (HCC)   CKD (chronic kidney disease), stage IV (HCC)   Obesity, Class III, BMI 40-49.9 (morbid obesity) ([place])   Type 2 diabetes mellitus (HCC)   Hypothyroidism   Volume overload   On mechanically assisted ventilation (HCC)   Acute metabolic encephalopathy   Acute respiratory failure with hypoxia and hypercapnia (HCC)   SVT (supraventricular tachycardia) (HCC)   Acute pulmonary edema (HCC)   ESRD: Nephrology following.Initiated hemodialysis on 09/25/2019.  Underwent  tunnel catheter and AV fistula by vascular surgery on 09/27/19. She has good urine output.  Acute hypoxic/hypercarbic respiratory failure: Present on admission.  Presented with anasarca.  She was intubated  on presentation.  Currently extubated and respiratory status stable.  Continue BiPAP at  night.  Diastolic CHF/anasarca/pleural effusion: Diuretics discontinued.  Anticipate further improvement after hemodialysis.  Acute metabolic encephalopathy: Secondary to hypercarbia on presentation.  Resolved  Paroxysmal A. [id number] score 5.  On Eliquis 2.5 mg bID  Moderate protein calorie moderation: Dietitian following  Hypertension: Currently blood pressure stable.  Continue current regimen.  Hypothyroidism: Continue Synthyroid  Normocytic anemia: Associated with chronic kidney disease.  Continue to monitor.  H&H stable.  History of depression/schizophrenia: Continue current medications  Diabetes type 2: Continue current insulin regimen.  Monitor CBGs  Bilateral lower extremity pain: Most likely neuropathic.  Better now.  Continue gabapentin at current dose  Debility/deconditioning: Seen by PT/OT and recommended skilled nursing facility on discharge.    Nutrition Problem: Increased nutrient needs Etiology: acute illness(AKI superimposed on CKD requiring HD)      DVT prophylaxis: Eliquis Code Status: Full Family Communication: None present at the bedside Disposition Plan: Patient is from group home.  /PT/  OT has recommended skilled facility.SnF with dialysis facility will be needed on DC.She will be discharged to nursing facility with dialysis.  Consultants: Nephrology Procedures: Intubation/extubation/central line placement.  Antimicrobials:  Anti-infectives (From admission, onward)   Start     Dose/Rate Route Frequency Ordered Stop   09/27/19 0600  vancomycin (VANCOCIN) IVPB 1000 mg/200 mL premix     1,000 mg 200 mL/hr over 60 Minutes Intravenous To Surgery 09/26/19 0834 09/28/19 0600   09/26/19 0600  vancomycin (VANCOCIN) IVPB 1000 mg/200 mL premix  Status:  Discontinued     1,000 mg 200 mL/hr over 60 Minutes Intravenous To Surgery 09/25/19 1028 09/26/19 0843   09/14/19 1500  meropenem (MERREM) 500 mg in sodium chloride 0.9 %  100 mL IVPB  Status:   Discontinued     500 mg 200 mL/hr over 30 Minutes Intravenous Every 24 hours 09/14/19 1345 09/17/19 0821   09/14/19 1400  vancomycin (VANCOREADY) IVPB 2000 mg/400 mL     2,000 mg 200 mL/hr over 120 Minutes Intravenous  Once 09/14/19 1328 09/14/19 1832   09/14/19 1346  vancomycin variable dose per unstable renal function (pharmacist dosing)  Status:  Discontinued      Does not apply See admin instructions 09/14/19 1346 09/16/19 0929   09/14/19 0600  vancomycin (VANCOCIN) IVPB 1000 mg/200 mL premix  Status:  Discontinued     1,000 mg 200 mL/hr over 60 Minutes Intravenous On call to O.R. 09/13/19 1634 09/14/19 1219   09/11/19 1100  levofloxacin (LEVAQUIN) IVPB 500 mg  Status:  Discontinued     500 mg 100 mL/hr over 60 Minutes Intravenous Every 48 hours 09/09/19 1041 09/14/19 1259   09/09/19 1045  levofloxacin (LEVAQUIN) IVPB 750 mg     750 mg 100 mL/hr over 90 Minutes Intravenous  Once 09/09/19 1041 09/09/19 2208      Subjective:  Patient seen and examined at the bedside this morning.  Hemodynamically stable.  No new issues.  Just came after the surgery Objective: Vitals:   09/27/19 0925 09/27/19 0930 09/27/19 0937 09/27/19 0938  BP: (!) 134/59   (!) 133/57  Pulse: 67 68 66 66  Resp: 20 15 20 18   Temp:    98.1 F (36.7 C)  TempSrc:      SpO2: 94% 96% 95% 95%  Weight:      Height:        Intake/Output Summary (Last 24 hours) at 09/27/2019 1253 Last data filed at 09/27/2019 0940 Gross per 24 hour  Intake 895 ml  Output 1190 ml  Net -295 ml   Filed Weights   09/23/19 2031 09/24/19 0500 09/25/19 1648  Weight: 87.3 kg 87.3 kg 90.8 kg    Examination:  General exam: Debilitated, deconditioned, chronically looking Respiratory system: no wheezes or crackles  Cardiovascular system: S1 & S2 heard, RRR. No JVD, murmurs, rubs, gallops or clicks.  Dialysis catheter on the left chest Gastrointestinal system: Abdomen is nondistended, soft and nontender. No organomegaly or masses  felt. Normal bowel sounds heard. Central nervous system: Alert and oriented. No focal neurological deficits. Extremities: No edema, no clubbing ,no cyanosis, AV fistula on right arm Skin: No rashes, lesions or ulcers,no icterus ,no pallor   Data Reviewed: I have personally reviewed following labs and imaging studies  CBC: Recent Labs  Lab 09/21/19 1409 09/24/19 0455 09/27/19 0719  WBC 10.7* 9.9  --   HGB 9.6* 8.4* 7.8*  HCT 29.4* 25.8* 23.0*  MCV 98.7 100.0  --   PLT 261 242  --    Basic Metabolic Panel: Recent Labs  Lab 09/23/19 0941 09/23/19 0941 09/24/19 0455 09/25/19 0417 09/26/19 0548 09/27/19 0443 09/27/19 0719  NA 142   < > 138 137 142 141 138  K 3.6   < > 4.1 4.2 3.8 4.2 4.1  CL 106   < > 100 101 107 100 101  CO2 25  --  25 24 26 27   --   GLUCOSE 212*   < > 146* 156* 104* 125* 105*  BUN 75*   < > 76* 75* 33* 45* 40*  CREATININE 5.15*   < > 5.43* 5.71* 3.39* 4.58* 4.70*  CALCIUM 8.4*  --  8.5* 8.5* 8.5* 8.8*  --   MG  --   --   --   --  2.2  --   --   PHOS 4.4  --  4.5 4.2 3.8 5.3*  --    < > = values in this interval not displayed.   GFR: Estimated Creatinine Clearance: 12.2 mL/min (A) (by C-G formula based on SCr of 4.7 mg/dL (H)). Liver Function Tests: Recent Labs  Lab 09/23/19 0941 09/24/19 0455 09/25/19 0417 09/26/19 0548 09/27/19 0443  ALBUMIN 2.0* 2.0* 2.0* 2.1* 2.1*   No results for input(s): LIPASE, AMYLASE in the last 168 hours. No results for input(s): AMMONIA in the last 168 hours. Coagulation Profile: No results for input(s): INR, PROTIME in the last 168 hours. Cardiac Enzymes: No results for input(s): CKTOTAL, CKMB, CKMBINDEX, TROPONINI in the last 168 hours. BNP (last 3 results) No results for input(s): PROBNP in the last 8760 hours. HbA1C: No results for input(s): HGBA1C in the last 72 hours. CBG: Recent Labs  Lab 09/26/19 1640 09/26/19 2147 09/27/19 0600 09/27/19 0901 09/27/19 1115  GLUCAP 248* 243* 96 121* 164*   Lipid  Profile: No results for input(s): CHOL, HDL, LDLCALC, TRIG, CHOLHDL, LDLDIRECT in the last 72 hours. Thyroid Function Tests: No results for input(s): TSH, T4TOTAL, FREET4, T3FREE, THYROIDAB in the last 72 hours. Anemia Panel: No results for input(s): VITAMINB12, FOLATE, FERRITIN, TIBC, IRON, RETICCTPCT in the last 72 hours. Sepsis Labs: No results for input(s): PROCALCITON, LATICACIDVEN in the last 168 hours.  Recent Results (from the past 240 hour(s))  Surgical PCR screen     Status: None   Collection Time: 09/26/19  7:51 PM   Specimen: Nasal Mucosa; Nasal Swab  Result Value Ref Range Status   MRSA, PCR NEGATIVE NEGATIVE Final   Staphylococcus aureus NEGATIVE NEGATIVE Final    Comment: (NOTE) The Xpert SA Assay (FDA approved for NASAL specimens in patients 51 years of age and older), is one component of a comprehensive surveillance program. It is not intended to diagnose infection nor to guide or monitor treatment. Performed at [facility], [place] [address] [place], [place] [address]          Radiology Studies: [facility] Chest [place] 1V same Day  Result Date: 09/27/2019 CLINICAL DATA:  Postoperative state. EXAM: PORTABLE CHEST 1 VIEW COMPARISON:  09/17/2019 FINDINGS: Left jugular dialysis catheter in place. The catheter tip is in the upper SVC region. Heart size is upper limits of normal but stable. Improved aeration in both lungs compared to the previous examination. No significant airspace disease. Central vascular structures are mildly enlarged. Negative for a pneumothorax. IMPRESSION: 1. Placement of left jugular dialysis catheter. Catheter tip in the upper SVC region. 2. Mildly prominent central vascular structures without pulmonary edema. Negative for a pneumothorax. Electronically Signed   By: Markus Daft M.D.   On: 09/27/2019 09:42   DG Fluoro Guide CV Line-No Report  Result Date: 09/27/2019 Fluoroscopy was utilized by the requesting physician.  No radiographic  interpretation.        Scheduled Meds: . apixaban  2.5 mg Oral BID  . aspirin  81 mg Oral q morning - 10a  . atorvastatin  20 mg Oral q1800  . calcitRIOL  0.25 mcg Oral Q M,W,F  . carvedilol  25 mg Oral BID WC  . Chlorhexidine Gluconate Cloth  6 each Topical Q0600  . cholecalciferol  4,000 Units Oral q morning - 10a  . darbepoetin (ARANESP) injection - NON-DIALYSIS  150 mcg Subcutaneous Q Thu-1800  . docusate  100 mg Oral BID  . feeding supplement (NEPRO CARB STEADY)  237 mL Oral BID BM  . fentaNYL (SUBLIMAZE) injection  25 mcg Intravenous Once  . gabapentin  100 mg Oral TID  . hydrALAZINE  100 mg Oral Q8H  . influenza vaccine adjuvanted  0.5 mL Intramuscular Tomorrow-1000  . insulin aspart  0-9 Units Subcutaneous TID WC  . insulin glargine  12 Units Subcutaneous QHS  . levothyroxine  25 mcg Oral Q0600  . mouth rinse  15 mL Mouth Rinse BID  . montelukast  10 mg Oral QHS  . multivitamin  15 mL Oral Daily  . mupirocin ointment  1 application Nasal BID  . neomycin-bacitracin-polymyxin  1 application Topical Daily  . pantoprazole  40 mg Oral Daily  . predniSONE  10 mg Oral Q breakfast  . primidone  50 mg Oral q morning - 10a  . pyridOXINE  200 mg Oral BID  . risperiDONE  0.5 mg Oral BID  . sodium chloride flush  3 mL Intravenous Q12H  . traZODone  50 mg Oral Daily   Continuous Infusions: . sodium chloride    . sodium chloride    . sodium chloride    . sodium chloride Stopped (09/21/19 1105)  . sodium chloride    . sodium chloride    . vancomycin       LOS: 21 days    Time spent: 25 mins. More than 50% of that time was spent in counseling and/or coordination of care.      Shelly Coss, MD [facility] P2/06/2020, 12:53 PM

## 2019-09-27 NOTE — Progress Notes (Signed)
VAST completing evening line care. Noted Left femoral [place] in place with large amount of old drainage beneath dressing. Tunneled [place] cath placed this am. Spoke with pt's nurse about femoral line reamaining in place. Alesha, RN stated she paged physician and is awaiting a return call to determine if femoral line can be pulled. She will place IV team consult for dc of line or dressing change once she speaks with physician.

## 2019-09-27 NOTE — Progress Notes (Signed)
  Progress Note    09/27/2019 7:26 AM Day of Surgery  Subjective: no overnight issues  Vitals:   09/26/19 2147 09/27/19 0525  BP: 131/60 (!) 159/67  Pulse: 74 65  Resp: 19 16  Temp: 98.4 F (36.9 C) 98.2 F (36.8 C)  SpO2: 96% 98%    Physical Exam: aaox3 Non labored respirations Left groin catheter in place  CBC    Component Value Date/Time   WBC 9.9 09/24/2019 0455   RBC 2.58 (L) 09/24/2019 0455   HGB 8.4 (L) 09/24/2019 0455   HCT 25.8 (L) 09/24/2019 0455   PLT 242 09/24/2019 0455   MCV 100.0 09/24/2019 0455   MCH 32.6 09/24/2019 0455   MCHC 32.6 09/24/2019 0455   RDW 14.5 09/24/2019 0455   LYMPHSABS 0.5 (L) 09/06/2019 0343   MONOABS 0.3 09/06/2019 0343   EOSABS 0.0 09/06/2019 0343   BASOSABS 0.0 09/06/2019 0343    BMET    Component Value Date/Time   NA 141 09/27/2019 0443   K 4.2 09/27/2019 0443   CL 100 09/27/2019 0443   CO2 27 09/27/2019 0443   GLUCOSE 125 (H) 09/27/2019 0443   BUN 45 (H) 09/27/2019 0443   CREATININE 4.58 (H) 09/27/2019 0443   CREATININE 2.54 (H) 08/02/2018 1411   CALCIUM 8.8 (L) 09/27/2019 0443   GFRNONAA 9 (L) 09/27/2019 0443   GFRAA 11 (L) 09/27/2019 0443    INR    Component Value Date/Time   INR 1.1 09/09/2019 1224     Intake/Output Summary (Last 24 hours) at 09/27/2019 0726 Last data filed at 09/27/2019 0600 Gross per 24 hour  Intake 720 ml  Output 1375 ml  Net -655 ml     Assessment/plan:  69 y.o. female is now considered esrd. Plan for tdc and right arm avf vs graft today in OR.     Jerriyah Louis C. Donzetta Matters, MD [facility] Office: [phone] Pager: [phone]  09/27/2019 7:26 AM

## 2019-09-28 DIAGNOSIS — E8779 Other fluid overload: Secondary | ICD-10-CM | POA: Diagnosis not present

## 2019-09-28 DIAGNOSIS — N179 Acute kidney failure, unspecified: Secondary | ICD-10-CM | POA: Diagnosis not present

## 2019-09-28 DIAGNOSIS — N189 Chronic kidney disease, unspecified: Secondary | ICD-10-CM | POA: Diagnosis not present

## 2019-09-28 DIAGNOSIS — R0602 Shortness of breath: Secondary | ICD-10-CM | POA: Diagnosis not present

## 2019-09-28 LAB — GLUCOSE, CAPILLARY
Glucose-Capillary: 138 mg/dL — ABNORMAL HIGH (ref 70–99)
Glucose-Capillary: 215 mg/dL — ABNORMAL HIGH (ref 70–99)
Glucose-Capillary: 290 mg/dL — ABNORMAL HIGH (ref 70–99)

## 2019-09-28 LAB — BASIC METABOLIC PANEL
Anion gap: 11 (ref 5–15)
BUN: 58 mg/dL — ABNORMAL HIGH (ref 8–23)
CO2: 28 mmol/L (ref 22–32)
Calcium: 8.7 mg/dL — ABNORMAL LOW (ref 8.9–10.3)
Chloride: 101 mmol/L (ref 98–111)
Creatinine, Ser: 5.15 mg/dL — ABNORMAL HIGH (ref 0.44–1.00)
GFR calc Af Amer: 9 mL/min — ABNORMAL LOW (ref >60)
GFR calc non Af Amer: 8 mL/min — ABNORMAL LOW (ref >60)
Glucose, Bld: 175 mg/dL — ABNORMAL HIGH (ref 70–99)
Potassium: 4.6 mmol/L (ref 3.5–5.1)
Sodium: 140 mmol/L (ref 135–145)

## 2019-09-28 LAB — CBC WITH DIFFERENTIAL/PLATELET
Abs Immature Granulocytes: 0.04 10*3/uL (ref 0.00–0.07)
Basophils Absolute: 0 10*3/uL (ref 0.0–0.1)
Basophils Relative: 0 %
Eosinophils Absolute: 0.3 10*3/uL (ref 0.0–0.5)
Eosinophils Relative: 4 %
HCT: 25.3 % — ABNORMAL LOW (ref 36.0–46.0)
Hemoglobin: 8 g/dL — ABNORMAL LOW (ref 12.0–15.0)
Immature Granulocytes: 1 %
Lymphocytes Relative: 10 %
Lymphs Abs: 0.9 10*3/uL (ref 0.7–4.0)
MCH: 32.5 pg (ref 26.0–34.0)
MCHC: 31.6 g/dL (ref 30.0–36.0)
MCV: 102.8 fL — ABNORMAL HIGH (ref 80.0–100.0)
Monocytes Absolute: 0.7 10*3/uL (ref 0.1–1.0)
Monocytes Relative: 8 %
Neutro Abs: 6.7 10*3/uL (ref 1.7–7.7)
Neutrophils Relative %: 77 %
Platelets: 228 10*3/uL (ref 150–400)
RBC: 2.46 MIL/uL — ABNORMAL LOW (ref 3.87–5.11)
RDW: 15.1 % (ref 11.5–15.5)
WBC: 8.6 10*3/uL (ref 4.0–10.5)
nRBC: 0 % (ref 0.0–0.2)

## 2019-09-28 MED ORDER — HEPARIN SODIUM (PORCINE) 1000 UNIT/ML IJ SOLN
INTRAMUSCULAR | Status: AC
  Administered 2019-09-28: 3000 [IU] via INTRAVENOUS_CENTRAL
  Filled 2019-09-28: qty 3

## 2019-09-28 MED ORDER — DARBEPOETIN ALFA 150 MCG/0.3ML IJ SOSY: 150.0000 ug | PREFILLED_SYRINGE | INTRAMUSCULAR | Status: DC

## 2019-09-28 MED ORDER — APIXABAN 5 MG PO TABS
5.0000 mg | ORAL_TABLET | Freq: Two times a day (BID) | ORAL | Status: DC
  Administered 2019-09-28 – 2019-10-01 (×6): 5 mg via ORAL
  Filled 2019-09-28 (×7): qty 1

## 2019-09-28 NOTE — Procedures (Signed)
Patient seen on Hemodialysis. BP (!) 143/60 (BP Location: Left Arm)   Pulse 66   Temp 98.2 F (36.8 C) (Axillary)   Resp 12   Ht 5\' 2"  (1.575 m)   Wt 93.4 kg   SpO2 100%   BMI 37.66 kg/m   QB 400, UF goal 2L Tolerating treatment without complaints at this time.   Elmarie Shiley MD [facility]. Office # [phone] Pager # [phone] 9:14 AM

## 2019-09-28 NOTE — TOC Progression Note (Addendum)
Transition of Care (TOC) - Progression Note  *Patient can discharge to SNF on Monday, 2/15.   Patient Details  Name: [name] MRN: [id number] Date of Birth: 1950-11-19  Transition of Care [facility]) CM/SW Contact  Sharlet Salina Mila Homer, LCSW Phone Number: 09/28/2019, 2:26 PM  Clinical Narrative:  On 2/11 CSW visited with patient and Level II PASRR evaluation completed with [place] evaluator talking with patient by phone. When asked by [facility], patient responded that she has been to a psychiatric hospital at least times and last psychiatric stay was over 5 years ago. She sees Dr. Hoyle Barr (not sure of spelling) at [facility] in [place].   On 2/11 CSW received call from [place], admissions director at [facility] regarding patient and asked about her HD location and schedule and catheter placement. [facility] also indicated that they would not need an LOG. Talked with Althia Forts with [facility] [phone]) and confirmed that patient's schedule is MWF at 10:45 am at [facility]. On her first visit, she will need to arrive at the HD Center at 10 am.   Talked with Larene Beach, with [facility] on 2/12 regarding patient (10:08 am). She will talk with her DON and get back with CSW regarding accepting patient.  4:23 pm - CSW contacted by Larene Beach and informed that they can take patient on Monday. When asked, [facility] explained that she has HD transportation arranged through an outside transportation company, however they won't be able to begin providing dialysis transport to Ms. Stager until next Wednesday. Ms. Gambrell can discharge to them on Monday after her HD treatment.     Barriers to Discharge: Continued Medical Work up  Expected Discharge Plan and Services   In-house Referral: Clinical Social Work     Living arrangements for the past 2 months: Group Home                                     Social Determinants of Health (SDOH) Interventions    Readmission Risk  Interventions Readmission Risk Prevention Plan 09/10/2019 11/29/2018  Transportation Screening Complete Complete  Medication Review (RN Care Manager) Complete Complete  PCP or Specialist appointment within 3-5 days of discharge - Not Complete  PCP/Specialist Appt Not Complete comments - earliest appt available was 12/08/18  [place] or [place] - Complete  SW Recovery Care/Counseling Consult - Complete  [place] - Not Applicable  Some recent data might be hidden

## 2019-09-28 NOTE — Progress Notes (Signed)
PT Cancellation Note  Patient Details Name: Mada Lazarin MRN: [id number] DOB: [birth year]   Cancelled Treatment:    Reason Eval/Treat Not Completed: Patient at procedure or test/unavailable. Pt in HD. PT to re-attempt as time allows.   Lorriane Shire 09/28/2019, 8:49 AM   Lorrin Goodell, PT  Office # [phone] Pager [phone]

## 2019-09-28 NOTE — Progress Notes (Signed)
Patient ID: [name], female   DOB: Oct 23, 1950, 69 y.o.   MRN: [id number] [facility] Progress Note   Assessment/ Plan:   1.  End-stage renal disease following dialysis dependent acute kidney injury on chronic kidney disease: Initiated on hemodialysis and now status post TDC/right BCF.  Dialysis unit placement confirmed ([facility] MWF) and not awaiting placement at SNF. Continue MWF HD while here.  2.  Acute hypoxic respiratory failure: Respiratory status back to baseline with hemodialysis/ultrafiltration. 3.  Anemia of chronic disease: With acceptable iron saturation, continue high-dose ESA.  No overt loss. 4.  Secondary hyperparathyroidism: Calcium and phosphorus level remain within acceptable range off ferric citrate.  On renal diet. 5.  Hypertension: Blood pressures currently appear to be under good control, monitor with hemodialysis.  Subjective:   Complains of insomnia overnight--denies steal symptoms.   Objective:   BP (!) 143/60 (BP Location: Left Arm)   Pulse 66   Temp 98.2 F (36.8 C) (Axillary)   Resp 12   Ht 5\' 2"  (1.575 m)   Wt 93.4 kg   SpO2 100%   BMI 37.66 kg/m   Intake/Output Summary (Last 24 hours) at 09/28/2019 0914 Last data filed at 09/28/2019 0100 Gross per 24 hour  Intake 335 ml  Output 1000 ml  Net -665 ml   Weight change:   Physical Exam: Gen: Comfortably resting in dialysis CVS: Pulse regular rhythm, normal rate, S1 and S2 normal Resp: Clear to auscultation, no rales/rhonchi Abd: Soft, obese, nontender, bowel sounds normal Ext: Trace lower extremity edema with left IJ TDC.  Palpable thrill right BCF  Imaging: DG Chest [place] 1V same Day  Result Date: 09/27/2019 CLINICAL DATA:  Postoperative state. EXAM: PORTABLE CHEST 1 VIEW COMPARISON:  09/17/2019 FINDINGS: Left jugular dialysis catheter in place. The catheter tip is in the upper SVC region. Heart size is upper limits of normal but stable. Improved aeration in both lungs  compared to the previous examination. No significant airspace disease. Central vascular structures are mildly enlarged. Negative for a pneumothorax. IMPRESSION: 1. Placement of left jugular dialysis catheter. Catheter tip in the upper SVC region. 2. Mildly prominent central vascular structures without pulmonary edema. Negative for a pneumothorax. Electronically Signed   By: Markus Daft M.D.   On: 09/27/2019 09:42   DG Fluoro Guide CV Line-No Report  Result Date: 09/27/2019 Fluoroscopy was utilized by the requesting physician.  No radiographic interpretation.    Labs: BMET Recent Labs  Lab 09/22/19 0417 09/22/19 0417 09/23/19 0941 09/24/19 0455 09/25/19 0417 09/26/19 0548 09/27/19 0443 09/27/19 0719 09/28/19 0410  NA 142   < > 142 138 137 142 141 138 140  K 3.5   < > 3.6 4.1 4.2 3.8 4.2 4.1 4.6  CL 106   < > 106 100 101 107 100 101 101  CO2 27  --  25 25 24 26 27   --  28  GLUCOSE 156*   < > 212* 146* 156* 104* 125* 105* 175*  BUN 73*   < > 75* 76* 75* 33* 45* 40* 58*  CREATININE 4.59*   < > 5.15* 5.43* 5.71* 3.39* 4.58* 4.70* 5.15*  CALCIUM 8.8*  --  8.4* 8.5* 8.5* 8.5* 8.8*  --  8.7*  PHOS 4.3  --  4.4 4.5 4.2 3.8 5.3*  --   --    < > = values in this interval not displayed.   CBC Recent Labs  Lab 09/21/19 1409 09/24/19 0455 09/27/19 0719 09/28/19 0410  WBC 10.7* 9.9  --  8.6  NEUTROABS  --   --   --  6.7  HGB 9.6* 8.4* 7.8* 8.0*  HCT 29.4* 25.8* 23.0* 25.3*  MCV 98.7 100.0  --  102.8*  PLT 261 242  --  228   Medications:    . apixaban  2.5 mg Oral BID  . aspirin  81 mg Oral q morning - 10a  . atorvastatin  20 mg Oral q1800  . calcitRIOL  0.25 mcg Oral Q M,W,F  . carvedilol  25 mg Oral BID WC  . Chlorhexidine Gluconate Cloth  6 each Topical Q0600  . cholecalciferol  4,000 Units Oral q morning - 10a  . darbepoetin (ARANESP) injection - NON-DIALYSIS  150 mcg Subcutaneous Q Thu-1800  . docusate  100 mg Oral BID  . feeding supplement (NEPRO CARB STEADY)  237 mL Oral  BID BM  . fentaNYL (SUBLIMAZE) injection  25 mcg Intravenous Once  . gabapentin  100 mg Oral TID  . hydrALAZINE  100 mg Oral Q8H  . influenza vaccine adjuvanted  0.5 mL Intramuscular Tomorrow-1000  . insulin aspart  0-9 Units Subcutaneous TID WC  . insulin glargine  12 Units Subcutaneous QHS  . levothyroxine  25 mcg Oral Q0600  . mouth rinse  15 mL Mouth Rinse BID  . montelukast  10 mg Oral QHS  . multivitamin  15 mL Oral Daily  . mupirocin ointment  1 application Nasal BID  . neomycin-bacitracin-polymyxin  1 application Topical Daily  . pantoprazole  40 mg Oral Daily  . predniSONE  10 mg Oral Q breakfast  . primidone  50 mg Oral q morning - 10a  . pyridOXINE  200 mg Oral BID  . risperiDONE  0.5 mg Oral BID  . sodium chloride flush  3 mL Intravenous Q12H  . traZODone  50 mg Oral Daily   Elmarie Shiley, MD 09/28/2019, 9:14 AM

## 2019-09-28 NOTE — Progress Notes (Signed)
PROGRESS NOTE    [name]  [id number] DOB: [birth year] DOA: 09/06/2019 PCP: Glenda Chroman, MD   Brief Narrative: Patient is a 69 year old female from group home with history of chronic kidney disease, COPD, diastolic CHF who presented with shortness of breath, generalized edema.  She was found to have fluid overload on presentation.  She was on acute hypoxic respiratpry failure due to pulmonary edema and had to be intubated.  Currently extubated and respiratory status stable.  Current issue is AKI on CKD which is likely most likely  progression of her underlying renal disease. Now she is in ESRD. Nephrology is closely following. Initiated hemodialysis on 09/25/2019.  Underwent  tunnel catheter and dialysis access placement by vascular surgery on 09/27/19.Underwent  dialysis today.  Outpatient dialysis facility has been found.  Waiting for placement in a skilled nursing facility.  She is medically stable for discharge from medical perspective.  Assessment & Plan:   Principal Problem:   Acute kidney injury superimposed on chronic kidney disease ([place]) Active Problems:   Type 2 diabetes mellitus with diabetic polyneuropathy, without long-term current use of insulin (HCC)   Normocytic anemia   Schizophrenia (HCC)   Depression   Essential hypertension   Chronic diastolic heart failure (HCC)   CKD (chronic kidney disease), stage IV (HCC)   Obesity, Class III, BMI 40-49.9 (morbid obesity) ([place])   Type 2 diabetes mellitus (HCC)   Hypothyroidism   Volume overload   On mechanically assisted ventilation (HCC)   Acute metabolic encephalopathy   Acute respiratory failure with hypoxia and hypercapnia (HCC)   SVT (supraventricular tachycardia) (HCC)   Acute pulmonary edema (HCC)   ESRD: Nephrology following.Initiated hemodialysis on 09/25/2019.  Underwent  tunnel catheter and AV fistula by vascular surgery on 09/27/19.  Underwent dialysis today.  Outpatient dialysis unit has been  confirmed.  Acute hypoxic/hypercarbic respiratory failure: Present on admission.  Presented with anasarca.  She was intubated  on presentation.  Currently extubated and respiratory status stable.  Continue BiPAP at night.  Diastolic CHF/anasarca/pleural effusion: Diuretics discontinued.  Anticipate further improvement after hemodialysis.  Acute metabolic encephalopathy: Secondary to hypercarbia on presentation.  Resolved  Paroxysmal A. [id number] score 5.  On Eliquis 2.5 mg bID  Moderate protein calorie moderation: Dietitian following  Hypertension: Currently blood pressure stable.  Continue current regimen.  Hypothyroidism: Continue Synthyroid  Normocytic anemia: Associated with chronic kidney disease.  Continue to monitor.  H&H stable.  History of depression/schizophrenia: Continue current medications  Diabetes type 2: Continue current insulin regimen.  Monitor CBGs  Bilateral lower extremity pain: Most likely neuropathic.  Better now.  Continue gabapentin at current dose  Debility/deconditioning: Seen by PT/OT and recommended skilled nursing facility on discharge.    Nutrition Problem: Increased nutrient needs Etiology: acute illness(AKI superimposed on CKD requiring HD)      DVT prophylaxis: Eliquis Code Status: Full Family Communication: None present at the bedside Disposition Plan: Patient is from group home.  PT/  OT has recommended skilled facility.she is hemodynamically stable for discharge  to SNF as soon  as bed is available.  Consultants: Nephrology Procedures: Intubation/extubation/central line placement.  Antimicrobials:  Anti-infectives (From admission, onward)   Start     Dose/Rate Route Frequency Ordered Stop   09/27/19 0600  vancomycin (VANCOCIN) IVPB 1000 mg/200 mL premix     1,000 mg 200 mL/hr over 60 Minutes Intravenous To Surgery 09/26/19 0834 09/28/19 0600   09/26/19 0600  vancomycin (VANCOCIN) IVPB 1000 mg/200 mL premix  Status:  Discontinued     1,000 mg 200 mL/hr over 60 Minutes Intravenous To Surgery 09/25/19 1028 09/26/19 0843   09/14/19 1500  meropenem (MERREM) 500 mg in sodium chloride 0.9 % 100 mL IVPB  Status:  Discontinued     500 mg 200 mL/hr over 30 Minutes Intravenous Every 24 hours 09/14/19 1345 09/17/19 0821   09/14/19 1400  vancomycin (VANCOREADY) IVPB 2000 mg/400 mL     2,000 mg 200 mL/hr over 120 Minutes Intravenous  Once 09/14/19 1328 09/14/19 1832   09/14/19 1346  vancomycin variable dose per unstable renal function (pharmacist dosing)  Status:  Discontinued      Does not apply See admin instructions 09/14/19 1346 09/16/19 0929   09/14/19 0600  vancomycin (VANCOCIN) IVPB 1000 mg/200 mL premix  Status:  Discontinued     1,000 mg 200 mL/hr over 60 Minutes Intravenous On call to O.R. 09/13/19 1634 09/14/19 1219   09/11/19 1100  levofloxacin (LEVAQUIN) IVPB 500 mg  Status:  Discontinued     500 mg 100 mL/hr over 60 Minutes Intravenous Every 48 hours 09/09/19 1041 09/14/19 1259   09/09/19 1045  levofloxacin (LEVAQUIN) IVPB 750 mg     750 mg 100 mL/hr over 90 Minutes Intravenous  Once 09/09/19 1041 09/09/19 2208      Subjective:  Patient seen and examined at the bedside this morning.  Hemodynamically stable.  She was a dialysis suite.  Denies any complaints.   Objective: Vitals:   09/27/19 0938 09/27/19 1631 09/27/19 2045 09/28/19 0500  BP: (!) 133/57 (!) 142/66 (!) 154/61   Pulse: 66 74 72   Resp: 18 18 18    Temp: 98.1 F (36.7 C) 98.2 F (36.8 C) 99 F (37.2 C)   TempSrc:  Oral Oral Oral  SpO2: 95% 98% 99%   Weight:      Height:        Intake/Output Summary (Last 24 hours) at 09/28/2019 0735 Last data filed at 09/28/2019 0100 Gross per 24 hour  Intake 735 ml  Output 1015 ml  Net -280 ml   Filed Weights   09/23/19 2031 09/24/19 0500 09/25/19 1648  Weight: 87.3 kg 87.3 kg 90.8 kg    Examination:  General exam: Debilitated, deconditioned, chronically ill looking Respiratory  system: no wheezes or crackles  Cardiovascular system: S1 & S2 heard, RRR.  Dialysis catheter on the left neck Gastrointestinal system: Abdomen is nondistended, soft and nontender. No organomegaly or masses felt. Normal bowel sounds heard. Central nervous system: Alert and oriented. No focal neurological deficits. Extremities: No edema, no clubbing ,no cyanosis, AV fistula on the right arm  skin: No rashes, lesions or ulcers,no icterus ,no pallor     Data Reviewed: I have personally reviewed following labs and imaging studies  CBC: Recent Labs  Lab 09/21/19 1409 09/24/19 0455 09/27/19 0719 09/28/19 0410  WBC 10.7* 9.9  --  8.6  NEUTROABS  --   --   --  6.7  HGB 9.6* 8.4* 7.8* 8.0*  HCT 29.4* 25.8* 23.0* 25.3*  MCV 98.7 100.0  --  102.8*  PLT 261 242  --  [id number]   Basic Metabolic Panel: Recent Labs  Lab 09/23/19 0941 09/23/19 0941 09/24/19 0455 09/24/19 0455 09/25/19 0417 09/26/19 0548 09/27/19 0443 09/27/19 0719 09/28/19 0410  NA 142   < > 138   < > 137 142 141 138 140  K 3.6   < > 4.1   < > 4.2 3.8 4.2 4.1 4.6  CL 106   < >  100   < > 101 107 100 101 101  CO2 25   < > 25  --  24 26 27   --  28  GLUCOSE 212*   < > 146*   < > 156* 104* 125* 105* 175*  BUN 75*   < > 76*   < > 75* 33* 45* 40* 58*  CREATININE 5.15*   < > 5.43*   < > 5.71* 3.39* 4.58* 4.70* 5.15*  CALCIUM 8.4*   < > 8.5*  --  8.5* 8.5* 8.8*  --  8.7*  MG  --   --   --   --   --  2.2  --   --   --   PHOS 4.4  --  4.5  --  4.2 3.8 5.3*  --   --    < > = values in this interval not displayed.   GFR: Estimated Creatinine Clearance: 11.1 mL/min (A) (by C-G formula based on SCr of 5.15 mg/dL (H)). Liver Function Tests: Recent Labs  Lab 09/23/19 0941 09/24/19 0455 09/25/19 0417 09/26/19 0548 09/27/19 0443  ALBUMIN 2.0* 2.0* 2.0* 2.1* 2.1*   No results for input(s): LIPASE, AMYLASE in the last 168 hours. No results for input(s): AMMONIA in the last 168 hours. Coagulation Profile: No results for  input(s): INR, PROTIME in the last 168 hours. Cardiac Enzymes: No results for input(s): CKTOTAL, CKMB, CKMBINDEX, TROPONINI in the last 168 hours. BNP (last 3 results) No results for input(s): PROBNP in the last 8760 hours. HbA1C: No results for input(s): HGBA1C in the last 72 hours. CBG: Recent Labs  Lab 09/27/19 0901 09/27/19 1115 09/27/19 1630 09/27/19 2042 09/28/19 0713  GLUCAP 121* 164* 238* 197* 138*   Lipid Profile: No results for input(s): CHOL, HDL, LDLCALC, TRIG, CHOLHDL, LDLDIRECT in the last 72 hours. Thyroid Function Tests: No results for input(s): TSH, T4TOTAL, FREET4, T3FREE, THYROIDAB in the last 72 hours. Anemia Panel: No results for input(s): VITAMINB12, FOLATE, FERRITIN, TIBC, IRON, RETICCTPCT in the last 72 hours. Sepsis Labs: No results for input(s): PROCALCITON, LATICACIDVEN in the last 168 hours.  Recent Results (from the past 240 hour(s))  Surgical PCR screen     Status: None   Collection Time: 09/26/19  7:51 PM   Specimen: Nasal Mucosa; Nasal Swab  Result Value Ref Range Status   MRSA, PCR NEGATIVE NEGATIVE Final   Staphylococcus aureus NEGATIVE NEGATIVE Final    Comment: (NOTE) The Xpert SA Assay (FDA approved for NASAL specimens in patients 5 years of age and older), is one component of a comprehensive surveillance program. It is not intended to diagnose infection nor to guide or monitor treatment. Performed at [facility], [place] [address] [place], [place] [address]          Radiology Studies: [facility] Chest [place] 1V same Day  Result Date: 09/27/2019 CLINICAL DATA:  Postoperative state. EXAM: PORTABLE CHEST 1 VIEW COMPARISON:  09/17/2019 FINDINGS: Left jugular dialysis catheter in place. The catheter tip is in the upper SVC region. Heart size is upper limits of normal but stable. Improved aeration in both lungs compared to the previous examination. No significant airspace disease. Central vascular structures are mildly enlarged. Negative  for a pneumothorax. IMPRESSION: 1. Placement of left jugular dialysis catheter. Catheter tip in the upper SVC region. 2. Mildly prominent central vascular structures without pulmonary edema. Negative for a pneumothorax. Electronically Signed   By: Markus Daft M.D.   On: 09/27/2019 09:42   DG  Fluoro Guide CV Line-No Report  Result Date: 09/27/2019 Fluoroscopy was utilized by the requesting physician.  No radiographic interpretation.        Scheduled Meds: . apixaban  2.5 mg Oral BID  . aspirin  81 mg Oral q morning - 10a  . atorvastatin  20 mg Oral q1800  . calcitRIOL  0.25 mcg Oral Q M,W,F  . carvedilol  25 mg Oral BID WC  . Chlorhexidine Gluconate Cloth  6 each Topical Q0600  . cholecalciferol  4,000 Units Oral q morning - 10a  . darbepoetin (ARANESP) injection - NON-DIALYSIS  150 mcg Subcutaneous Q Thu-1800  . docusate  100 mg Oral BID  . feeding supplement (NEPRO CARB STEADY)  237 mL Oral BID BM  . fentaNYL (SUBLIMAZE) injection  25 mcg Intravenous Once  . gabapentin  100 mg Oral TID  . hydrALAZINE  100 mg Oral Q8H  . influenza vaccine adjuvanted  0.5 mL Intramuscular Tomorrow-1000  . insulin aspart  0-9 Units Subcutaneous TID WC  . insulin glargine  12 Units Subcutaneous QHS  . levothyroxine  25 mcg Oral Q0600  . mouth rinse  15 mL Mouth Rinse BID  . montelukast  10 mg Oral QHS  . multivitamin  15 mL Oral Daily  . mupirocin ointment  1 application Nasal BID  . neomycin-bacitracin-polymyxin  1 application Topical Daily  . pantoprazole  40 mg Oral Daily  . predniSONE  10 mg Oral Q breakfast  . primidone  50 mg Oral q morning - 10a  . pyridOXINE  200 mg Oral BID  . risperiDONE  0.5 mg Oral BID  . sodium chloride flush  3 mL Intravenous Q12H  . traZODone  50 mg Oral Daily   Continuous Infusions: . sodium chloride    . sodium chloride    . sodium chloride    . sodium chloride Stopped (09/21/19 1105)  . sodium chloride    . sodium chloride       LOS: 22 days    Time  spent: 25 mins. More than 50% of that time was spent in counseling and/or coordination of care.      Shelly Coss, MD [facility] P2/07/2020, 7:34 AM

## 2019-09-29 DIAGNOSIS — R0602 Shortness of breath: Secondary | ICD-10-CM | POA: Diagnosis not present

## 2019-09-29 DIAGNOSIS — E8779 Other fluid overload: Secondary | ICD-10-CM | POA: Diagnosis not present

## 2019-09-29 DIAGNOSIS — N179 Acute kidney failure, unspecified: Secondary | ICD-10-CM | POA: Diagnosis not present

## 2019-09-29 DIAGNOSIS — N189 Chronic kidney disease, unspecified: Secondary | ICD-10-CM | POA: Diagnosis not present

## 2019-09-29 LAB — GLUCOSE, CAPILLARY
Glucose-Capillary: 106 mg/dL — ABNORMAL HIGH (ref 70–99)
Glucose-Capillary: 246 mg/dL — ABNORMAL HIGH (ref 70–99)
Glucose-Capillary: 212 mg/dL — ABNORMAL HIGH (ref 70–99)
Glucose-Capillary: 284 mg/dL — ABNORMAL HIGH (ref 70–99)

## 2019-09-29 NOTE — Progress Notes (Signed)
Patient is sleep at this time. Once awaken to ask if patient wants to use CPAP. Patient nodded No. Patient is resting comfortably at this time

## 2019-09-29 NOTE — Progress Notes (Signed)
Patient ID: [name], female   DOB: 04/08/1951, 69 y.o.   MRN: [id number] [facility] Progress Note   Assessment/ Plan:   1.  End-stage renal disease following dialysis dependent acute kidney injury on chronic kidney disease: Initiated on hemodialysis and now status post TDC/right BCF.  Dialysis unit placement confirmed ([facility] M[facility]) and will be able to be accepted back to her SNF on Monday after dialysis (to allow for time for them to arrange transportation starting Wednesday). Continue MWF HD while here with next dialysis on Monday.  2.  Acute hypoxic respiratory failure: Resolved with ultrafiltration/hemodialysis. 3.  Anemia of chronic disease: With acceptable iron saturation, continue high-dose ESA.  No overt loss. 4.  Secondary hyperparathyroidism: Calcium and phosphorus level remain within acceptable range off ferric citrate.  On renal diet. 5.  Hypertension: Blood pressures currently appear to be under good control, monitor with hemodialysis.  Subjective:   Denies any complaints overnight and expresses some joy about leaving the hospital in a couple of days.   Objective:   BP (!) 131/53 (BP Location: Left Arm)   Pulse 80   Temp 98 F (36.7 C) (Oral)   Resp 16   Ht 5\' 2"  (1.575 m)   Wt 89.7 kg   SpO2 96%   BMI 36.17 kg/m   Intake/Output Summary (Last 24 hours) at 09/29/2019 0900 Last data filed at 09/29/2019 0846 Gross per 24 hour  Intake 980 ml  Output 2700 ml  Net -1720 ml   Weight change:   Physical Exam: Gen: Comfortably resting in bed, watching television CVS: Pulse regular rhythm, normal rate, S1 and S2 normal Resp: Clear to auscultation, no rales/rhonchi Abd: Soft, obese, nontender, bowel sounds normal Ext: Trace lower extremity edema with left IJ TDC.  Palpable thrill right BCF  Imaging: DG Chest [place] 1V same Day  Result Date: 09/27/2019 CLINICAL DATA:  Postoperative state. EXAM: PORTABLE CHEST 1 VIEW COMPARISON:  09/17/2019  FINDINGS: Left jugular dialysis catheter in place. The catheter tip is in the upper SVC region. Heart size is upper limits of normal but stable. Improved aeration in both lungs compared to the previous examination. No significant airspace disease. Central vascular structures are mildly enlarged. Negative for a pneumothorax. IMPRESSION: 1. Placement of left jugular dialysis catheter. Catheter tip in the upper SVC region. 2. Mildly prominent central vascular structures without pulmonary edema. Negative for a pneumothorax. Electronically Signed   By: Markus Daft M.D.   On: 09/27/2019 09:42    Labs: BMET Recent Labs  Lab 09/23/19 0941 09/24/19 0455 09/25/19 0417 09/26/19 0548 09/27/19 0443 09/27/19 0719 09/28/19 0410  NA 142 138 137 142 141 138 140  K 3.6 4.1 4.2 3.8 4.2 4.1 4.6  CL 106 100 101 107 100 101 101  CO2 25 25 24 26 27   --  28  GLUCOSE 212* 146* 156* 104* 125* 105* 175*  BUN 75* 76* 75* 33* 45* 40* 58*  CREATININE 5.15* 5.43* 5.71* 3.39* 4.58* 4.70* 5.15*  CALCIUM 8.4* 8.5* 8.5* 8.5* 8.8*  --  8.7*  PHOS 4.4 4.5 4.2 3.8 5.3*  --   --    CBC Recent Labs  Lab 09/24/19 0455 09/27/19 0719 09/28/19 0410  WBC 9.9  --  8.6  NEUTROABS  --   --  6.7  HGB 8.4* 7.8* 8.0*  HCT 25.8* 23.0* 25.3*  MCV 100.0  --  102.8*  PLT 242  --  228   Medications:    . apixaban  5 mg Oral BID  . aspirin  81 mg Oral q morning - 10a  . atorvastatin  20 mg Oral q1800  . calcitRIOL  0.25 mcg Oral Q M,W,F  . carvedilol  25 mg Oral BID WC  . Chlorhexidine Gluconate Cloth  6 each Topical Q0600  . cholecalciferol  4,000 Units Oral q morning - 10a  . [START ON 10/05/2019] darbepoetin (ARANESP) injection - NON-DIALYSIS  150 mcg Subcutaneous Q Fri-1800  . docusate  100 mg Oral BID  . feeding supplement (NEPRO CARB STEADY)  237 mL Oral BID BM  . fentaNYL (SUBLIMAZE) injection  25 mcg Intravenous Once  . gabapentin  100 mg Oral TID  . hydrALAZINE  100 mg Oral Q8H  . influenza vaccine adjuvanted  0.5  mL Intramuscular Tomorrow-1000  . insulin aspart  0-9 Units Subcutaneous TID WC  . insulin glargine  12 Units Subcutaneous QHS  . levothyroxine  25 mcg Oral Q0600  . mouth rinse  15 mL Mouth Rinse BID  . montelukast  10 mg Oral QHS  . multivitamin  15 mL Oral Daily  . mupirocin ointment  1 application Nasal BID  . neomycin-bacitracin-polymyxin  1 application Topical Daily  . pantoprazole  40 mg Oral Daily  . predniSONE  10 mg Oral Q breakfast  . primidone  50 mg Oral q morning - 10a  . pyridOXINE  200 mg Oral BID  . risperiDONE  0.5 mg Oral BID  . sodium chloride flush  3 mL Intravenous Q12H  . traZODone  50 mg Oral Daily   Elmarie Shiley, MD 09/29/2019, 9:00 AM

## 2019-09-29 NOTE — Progress Notes (Signed)
RT placed patient on CPAP HS. NO O2 bleed in needed. Patient tolerating well.  

## 2019-09-29 NOTE — Progress Notes (Signed)
Occupational Therapy Treatment Patient Details Name: [name] MRN: [id number] DOB: 1950-08-30 Today's Date: 09/29/2019    History of present illness 69 y.o. female   HTN, DM (on trulicity), hyperlipidemia, diastolic heart failure,  schizoaffective D/O with episodes of catatonia and sz d/o, COPD, CKD admitted with progressive wt gain, leg swelling sob with w/u c/w anasarca / bilateral effusions  more sob 1/23 and required intubation / mech with CTa neg PE, positive for mild bilateral pleural effusions with small lung voumes.  Extubated 1/29, then re-intubated 1/31 due to stridor, extubated 2/4.Left IJ TDC placed 2/11.   OT comments  Pt is progressing toward established OT goals. She required minA for sit<>stand and minguard for oral care at sink level and minA for brushing her hair. Pt will continue to benefit from skilled OT services to maximize safety and independence with ADL/IADL and functional mobility. Will continue to follow acutely and progress as tolerated.    Follow Up Recommendations  SNF;Supervision/Assistance - 24 hour    Equipment Recommendations       Recommendations for Other Services      Precautions / Restrictions Precautions Precautions: Fall Precaution Comments: Left IJ cath 2/11 Restrictions Weight Bearing Restrictions: No Other Position/Activity Restrictions: L IJ HD catheter       Mobility Bed Mobility               General bed mobility comments: sitting in recliner upon arrival  Transfers Overall transfer level: Needs assistance Equipment used: Rolling walker (2 wheeled) Transfers: Sit to/from Stand Sit to Stand: Min assist         General transfer comment: minA for stability    Balance Overall balance assessment: Needs assistance Sitting-balance support: Feet supported Sitting balance-Leahy Scale: Fair     Standing balance support: Single extremity supported;During functional activity Standing balance-Leahy Scale: Poor Standing  balance comment: reliant on at least single UE support in standing                           ADL either performed or assessed with clinical judgement   ADL Overall ADL's : Needs assistance/impaired     Grooming: Min guard;Standing;Minimal assistance Grooming Details (indicate cue type and reason): minguard for oral care while standing at sink level;minA for brushing hair                 Toilet Transfer: Minimal assistance;Ambulation;RW Toilet Transfer Details (indicate cue type and reason): simulated  Toileting- Clothing Manipulation and Hygiene: Minimal assistance;Sit to/from stand       Functional mobility during ADLs: Minimal assistance;Rolling walker General ADL Comments: decreased activity tolerance,      Vision   Vision Assessment?: No apparent visual deficits   Perception     Praxis      Cognition Arousal/Alertness: Awake/alert Behavior During Therapy: WFL for tasks assessed/performed Overall Cognitive Status: No family/caregiver present to determine baseline cognitive functioning                                 General Comments: cognitive deficits at baseline; appreciative of therapy session        Exercises     Shoulder Instructions       General Comments vss     Pertinent Vitals/ Pain       Pain Assessment: No/denies pain  Home Living  Prior Functioning/Environment              Frequency  Min 2X/week        Progress Toward Goals  OT Goals(current goals can now be found in the care plan section)  Progress towards OT goals: Progressing toward goals  Acute Rehab OT Goals Patient Stated Goal: to get stronger OT Goal Formulation: With patient Time For Goal Achievement: 10/13/19 Potential to Achieve Goals: Good ADL Goals Pt Will Perform Upper Body Dressing: with modified independence;sitting Pt Will Perform Lower Body Dressing: with  supervision;sit to/from stand Pt Will Transfer to Toilet: with supervision;grab bars;regular height toilet;ambulating  Plan Discharge plan remains appropriate    Co-evaluation                 AM-PAC OT "6 Clicks" Daily Activity     Outcome Measure   Help from another person eating meals?: A Little Help from another person taking care of personal grooming?: A Little Help from another person toileting, which includes using toliet, bedpan, or urinal?: A Lot Help from another person bathing (including washing, rinsing, drying)?: A Lot Help from another person to put on and taking off regular upper body clothing?: A Little Help from another person to put on and taking off regular lower body clothing?: A Lot 6 Click Score: 15    End of Session Equipment Utilized During Treatment: Rolling walker  OT Visit Diagnosis: Unsteadiness on feet (R26.81);Repeated falls (R29.6)   Activity Tolerance Patient tolerated treatment well   Patient Left in chair;with call bell/phone within reach;with chair alarm set   Nurse Communication Mobility status        Time: [id number] OT Time Calculation (min): 17 min  Charges: OT General Charges $OT Visit: 1 Visit OT Treatments $Self Care/Home Management : 8-22 mins  Dorinda Hill OTR/L Acute Rehabilitation Services Office: [place] 09/29/2019, 11:40 AM

## 2019-09-29 NOTE — Progress Notes (Signed)
PROGRESS NOTE    [name]  [id number] DOB: [birth year] DOA: 09/06/2019 PCP: Glenda Chroman, MD   Brief Narrative: Patient is a 69 year old female from group home with history of chronic kidney disease, COPD, diastolic CHF who presented with shortness of breath, generalized edema.  She was found to have fluid overload on presentation.  She was on acute hypoxic respiratpry failure due to pulmonary edema and had to be intubated.  Currently extubated and respiratory status stable.  Current issue is AKI on CKD which is likely most likely  progression of her underlying renal disease. Now she is in ESRD. Nephrology is closely following. Initiated hemodialysis on 09/25/2019.  Underwent  tunnel catheter and dialysis access placement by vascular surgery on 09/27/19.Outpatient dialysis facility has been found.  Waiting for placement in a skilled nursing facility.  She is medically stable for discharge from medical perspective.  Assessment & Plan:   Principal Problem:   Acute kidney injury superimposed on chronic kidney disease ([place]) Active Problems:   Type 2 diabetes mellitus with diabetic polyneuropathy, without long-term current use of insulin (HCC)   Normocytic anemia   Schizophrenia (HCC)   Depression   Essential hypertension   Chronic diastolic heart failure (HCC)   CKD (chronic kidney disease), stage IV (HCC)   Obesity, Class III, BMI 40-49.9 (morbid obesity) ([place])   Type 2 diabetes mellitus (HCC)   Hypothyroidism   Volume overload   On mechanically assisted ventilation (HCC)   Acute metabolic encephalopathy   Acute respiratory failure with hypoxia and hypercapnia (HCC)   SVT (supraventricular tachycardia) (HCC)   Acute pulmonary edema (HCC)   ESRD: Nephrology following.Initiated hemodialysis on 09/25/2019.  Underwent  tunnel catheter and AV fistula by vascular surgery on 09/27/19.    Outpatient dialysis unit has been confirmed.  Acute hypoxic/hypercarbic respiratory failure: Present  on admission.  Presented with anasarca.  She was intubated  on presentation.  Currently extubated and respiratory status stable.  Continue BiPAP at night.  Diastolic CHF/anasarca/pleural effusion: Diuretics discontinued.  Anticipate further improvement after hemodialysis.  Acute metabolic encephalopathy: Secondary to hypercarbia on presentation.  Resolved  Paroxysmal A. [id number] score 5.  On Eliquis 2.5 mg bID  Moderate protein calorie moderation: Dietitian following  Hypertension: Currently blood pressure stable.  Continue current regimen.  Hypothyroidism: Continue Synthyroid  Normocytic anemia: Associated with chronic kidney disease.  Continue to monitor.  H&H stable.  History of depression/schizophrenia: Continue current medications  Diabetes type 2: Continue current insulin regimen.  Monitor CBGs  Bilateral lower extremity pain: Most likely neuropathic.  Better now.  Continue gabapentin at current dose  Debility/deconditioning: Seen by PT/OT and recommended skilled nursing facility on discharge.    Nutrition Problem: Increased nutrient needs Etiology: acute illness(AKI superimposed on CKD requiring HD)      DVT prophylaxis: Eliquis Code Status: Full Family Communication: None present at the bedside Disposition Plan: Patient is from group home.  PT/  OT has recommended skilled facility.she is hemodynamically stable for discharge  to SNF as soon  as bed is available.  Consultants: Nephrology Procedures: Intubation/extubation/central line placement.  Antimicrobials:  Anti-infectives (From admission, onward)   Start     Dose/Rate Route Frequency Ordered Stop   09/27/19 0600  vancomycin (VANCOCIN) IVPB 1000 mg/200 mL premix     1,000 mg 200 mL/hr over 60 Minutes Intravenous To Surgery 09/26/19 0834 09/28/19 0600   09/26/19 0600  vancomycin (VANCOCIN) IVPB 1000 mg/200 mL premix  Status:  Discontinued     1,000 mg 200  mL/hr over 60 Minutes Intravenous To Surgery  09/25/19 1028 09/26/19 0843   09/14/19 1500  meropenem (MERREM) 500 mg in sodium chloride 0.9 % 100 mL IVPB  Status:  Discontinued     500 mg 200 mL/hr over 30 Minutes Intravenous Every 24 hours 09/14/19 1345 09/17/19 0821   09/14/19 1400  vancomycin (VANCOREADY) IVPB 2000 mg/400 mL     2,000 mg 200 mL/hr over 120 Minutes Intravenous  Once 09/14/19 1328 09/14/19 1832   09/14/19 1346  vancomycin variable dose per unstable renal function (pharmacist dosing)  Status:  Discontinued      Does not apply See admin instructions 09/14/19 1346 09/16/19 0929   09/14/19 0600  vancomycin (VANCOCIN) IVPB 1000 mg/200 mL premix  Status:  Discontinued     1,000 mg 200 mL/hr over 60 Minutes Intravenous On call to O.R. 09/13/19 1634 09/14/19 1219   09/11/19 1100  levofloxacin (LEVAQUIN) IVPB 500 mg  Status:  Discontinued     500 mg 100 mL/hr over 60 Minutes Intravenous Every 48 hours 09/09/19 1041 09/14/19 1259   09/09/19 1045  levofloxacin (LEVAQUIN) IVPB 750 mg     750 mg 100 mL/hr over 90 Minutes Intravenous  Once 09/09/19 1041 09/09/19 2208      Subjective:  The patient seen and examined the bedside this morning.  Hemodynamically stable.  No active issues.  Denies any complaints.   Objective: Vitals:   09/28/19 1655 09/28/19 2032 09/29/19 0443 09/29/19 0848  BP: (!) 128/59 (!) 130/57 (!) 116/51 (!) 131/53  Pulse: 80 72 70 80  Resp: 18 16 14 16   Temp: 98.9 F (37.2 C) 99 F (37.2 C) 98.1 F (36.7 C) 98 F (36.7 C)  TempSrc: Oral Oral Oral Oral  SpO2: 99% 98% 97% 96%  Weight:  89.7 kg    Height:        Intake/Output Summary (Last 24 hours) at 09/29/2019 0941 Last data filed at 09/29/2019 0846 Gross per 24 hour  Intake 980 ml  Output 2700 ml  Net -1720 ml   Filed Weights   09/28/19 0815 09/28/19 1245 09/28/19 2032  Weight: 93.4 kg 89.6 kg 89.7 kg    Examination:   General exam: comfortable Respiratory system: Bilateral equal air entry, normal vesicular breath sounds, no wheezes  or crackles  Cardiovascular system: S1 & S2 heard, RRR. No JVD, murmurs, rubs, gallops or clicks.  Dialysis catheter on the left neck Gastrointestinal system: Abdomen is nondistended, soft and nontender. No organomegaly or masses felt. Normal bowel sounds heard. Central nervous system: Alert and oriented. No focal neurological deficits. Extremities: No edema, no clubbing ,no cyanosis, AV fistula on the right arm  skin: No rashes, lesions or ulcers,no icterus ,no pallor    Data Reviewed: I have personally reviewed following labs and imaging studies  CBC: Recent Labs  Lab 09/24/19 0455 09/27/19 0719 09/28/19 0410  WBC 9.9  --  8.6  NEUTROABS  --   --  6.7  HGB 8.4* 7.8* 8.0*  HCT 25.8* 23.0* 25.3*  MCV 100.0  --  102.8*  PLT 242  --  [id number]   Basic Metabolic Panel: Recent Labs  Lab 09/23/19 0941 09/23/19 0941 09/24/19 0455 09/24/19 0455 09/25/19 0417 09/26/19 0548 09/27/19 0443 09/27/19 0719 09/28/19 0410  NA 142   < > 138   < > 137 142 141 138 140  K 3.6   < > 4.1   < > 4.2 3.8 4.2 4.1 4.6  CL 106   < >  100   < > 101 107 100 101 101  CO2 25   < > 25  --  24 26 27   --  28  GLUCOSE 212*   < > 146*   < > 156* 104* 125* 105* 175*  BUN 75*   < > 76*   < > 75* 33* 45* 40* 58*  CREATININE 5.15*   < > 5.43*   < > 5.71* 3.39* 4.58* 4.70* 5.15*  CALCIUM 8.4*   < > 8.5*  --  8.5* 8.5* 8.8*  --  8.7*  MG  --   --   --   --   --  2.2  --   --   --   PHOS 4.4  --  4.5  --  4.2 3.8 5.3*  --   --    < > = values in this interval not displayed.   GFR: Estimated Creatinine Clearance: 11 mL/min (A) (by C-G formula based on SCr of 5.15 mg/dL (H)). Liver Function Tests: Recent Labs  Lab 09/23/19 0941 09/24/19 0455 09/25/19 0417 09/26/19 0548 09/27/19 0443  ALBUMIN 2.0* 2.0* 2.0* 2.1* 2.1*   No results for input(s): LIPASE, AMYLASE in the last 168 hours. No results for input(s): AMMONIA in the last 168 hours. Coagulation Profile: No results for input(s): INR, PROTIME in the last  168 hours. Cardiac Enzymes: No results for input(s): CKTOTAL, CKMB, CKMBINDEX, TROPONINI in the last 168 hours. BNP (last 3 results) No results for input(s): PROBNP in the last 8760 hours. HbA1C: No results for input(s): HGBA1C in the last 72 hours. CBG: Recent Labs  Lab 09/27/19 2042 09/28/19 0713 09/28/19 1656 09/28/19 2029 09/29/19 0756  GLUCAP 197* 138* 215* 290* 106*   Lipid Profile: No results for input(s): CHOL, HDL, LDLCALC, TRIG, CHOLHDL, LDLDIRECT in the last 72 hours. Thyroid Function Tests: No results for input(s): TSH, T4TOTAL, FREET4, T3FREE, THYROIDAB in the last 72 hours. Anemia Panel: No results for input(s): VITAMINB12, FOLATE, FERRITIN, TIBC, IRON, RETICCTPCT in the last 72 hours. Sepsis Labs: No results for input(s): PROCALCITON, LATICACIDVEN in the last 168 hours.  Recent Results (from the past 240 hour(s))  Surgical PCR screen     Status: None   Collection Time: 09/26/19  7:51 PM   Specimen: Nasal Mucosa; Nasal Swab  Result Value Ref Range Status   MRSA, PCR NEGATIVE NEGATIVE Final   Staphylococcus aureus NEGATIVE NEGATIVE Final    Comment: (NOTE) The Xpert SA Assay (FDA approved for NASAL specimens in patients 42 years of age and older), is one component of a comprehensive surveillance program. It is not intended to diagnose infection nor to guide or monitor treatment. Performed at [facility], [place] [address] [place], [place] [address]          Radiology Studies: No results found.      Scheduled Meds: . apixaban  5 mg Oral BID  . aspirin  81 mg Oral q morning - 10a  . atorvastatin  20 mg Oral q1800  . calcitRIOL  0.25 mcg Oral Q M,W,F  . carvedilol  25 mg Oral BID WC  . Chlorhexidine Gluconate Cloth  6 each Topical Q0600  . cholecalciferol  4,000 Units Oral q morning - 10a  . [START ON 10/05/2019] darbepoetin (ARANESP) injection - NON-DIALYSIS  150 mcg Subcutaneous Q Fri-1800  . docusate  100 mg Oral BID  . feeding  supplement (NEPRO CARB STEADY)  237 mL Oral BID BM  . fentaNYL (SUBLIMAZE) injection  25 mcg Intravenous Once  . gabapentin  100 mg Oral TID  . hydrALAZINE  100 mg Oral Q8H  . influenza vaccine adjuvanted  0.5 mL Intramuscular Tomorrow-1000  . insulin aspart  0-9 Units Subcutaneous TID WC  . insulin glargine  12 Units Subcutaneous QHS  . levothyroxine  25 mcg Oral Q0600  . mouth rinse  15 mL Mouth Rinse BID  . montelukast  10 mg Oral QHS  . multivitamin  15 mL Oral Daily  . mupirocin ointment  1 application Nasal BID  . neomycin-bacitracin-polymyxin  1 application Topical Daily  . pantoprazole  40 mg Oral Daily  . predniSONE  10 mg Oral Q breakfast  . primidone  50 mg Oral q morning - 10a  . pyridOXINE  200 mg Oral BID  . risperiDONE  0.5 mg Oral BID  . sodium chloride flush  3 mL Intravenous Q12H  . traZODone  50 mg Oral Daily   Continuous Infusions: . sodium chloride    . sodium chloride    . sodium chloride    . sodium chloride Stopped (09/21/19 1105)  . sodium chloride    . sodium chloride       LOS: 23 days    Time spent: 25 mins. More than 50% of that time was spent in counseling and/or coordination of care.      Shelly Coss, MD [facility] P2/13/2021, 9:41 AM

## 2019-09-30 DIAGNOSIS — R0602 Shortness of breath: Secondary | ICD-10-CM | POA: Diagnosis not present

## 2019-09-30 DIAGNOSIS — N179 Acute kidney failure, unspecified: Secondary | ICD-10-CM | POA: Diagnosis not present

## 2019-09-30 DIAGNOSIS — E8779 Other fluid overload: Secondary | ICD-10-CM | POA: Diagnosis not present

## 2019-09-30 DIAGNOSIS — N189 Chronic kidney disease, unspecified: Secondary | ICD-10-CM | POA: Diagnosis not present

## 2019-09-30 LAB — GLUCOSE, CAPILLARY
Glucose-Capillary: 84 mg/dL (ref 70–99)
Glucose-Capillary: 213 mg/dL — ABNORMAL HIGH (ref 70–99)
Glucose-Capillary: 278 mg/dL — ABNORMAL HIGH (ref 70–99)
Glucose-Capillary: 254 mg/dL — ABNORMAL HIGH (ref 70–99)

## 2019-09-30 LAB — SARS CORONAVIRUS 2 (TAT 6-24 HRS): SARS Coronavirus 2: NEGATIVE

## 2019-09-30 NOTE — Progress Notes (Signed)
Patient ID: [name], female   DOB: 07/31/1951, 69 y.o.   MRN: [id number] [facility] Progress Note   Assessment/ Plan:   1.  End-stage renal disease following dialysis dependent acute kidney injury on chronic kidney disease: Initiated on hemodialysis and now status post TDC/right BCF.  Dialysis unit placement confirmed ([facility]  MWF) and will be able to be accepted back to her SNF on Monday after dialysis (to allow for time for them to arrange transportation starting Wednesday).  Hemodialysis ordered for tomorrow first shift. 2.  Acute hypoxic respiratory failure: Resolved with ultrafiltration/hemodialysis. 3.  Anemia of chronic disease: With acceptable iron saturation, continue high-dose ESA.  No overt loss. 4.  Secondary hyperparathyroidism: Calcium and phosphorus level remain within acceptable range off ferric citrate.  On renal diet. 5.  Hypertension: Blood pressures currently appear to be under good control, monitor with hemodialysis.  Subjective:   Without acute events overnight, continues to report intermittent leg discomfort.  Denies chest pain or shortness of breath.   Objective:   BP (!) 143/61 (BP Location: Left Arm)   Pulse 69   Temp 98.4 F (36.9 C) (Oral)   Resp 16   Ht 5\' 2"  (1.575 m)   Wt 89.7 kg   SpO2 99%   BMI 36.16 kg/m   Intake/Output Summary (Last 24 hours) at 09/30/2019 1113 Last data filed at 09/30/2019 0600 Gross per 24 hour  Intake 540 ml  Output 625 ml  Net -85 ml   Weight change: -3.711 kg  Physical Exam: Gen: Resting comfortably in recliner, watching television and working on coloring book. CVS: Pulse regular rhythm, normal rate, S1 and S2 normal Resp: Clear to auscultation, no rales/rhonchi Abd: Soft, obese, nontender, bowel sounds normal Ext: Trace lower extremity edema with left IJ TDC.  Palpable thrill right BCF  Imaging: No results found.  Labs: BMET Recent Labs  Lab 09/24/19 0455 09/25/19 0417  09/26/19 0548 09/27/19 0443 09/27/19 0719 09/28/19 0410  NA 138 137 142 141 138 140  K 4.1 4.2 3.8 4.2 4.1 4.6  CL 100 101 107 100 101 101  CO2 25 24 26 27   --  28  GLUCOSE 146* 156* 104* 125* 105* 175*  BUN 76* 75* 33* 45* 40* 58*  CREATININE 5.43* 5.71* 3.39* 4.58* 4.70* 5.15*  CALCIUM 8.5* 8.5* 8.5* 8.8*  --  8.7*  PHOS 4.5 4.2 3.8 5.3*  --   --    CBC Recent Labs  Lab 09/24/19 0455 09/27/19 0719 09/28/19 0410  WBC 9.9  --  8.6  NEUTROABS  --   --  6.7  HGB 8.4* 7.8* 8.0*  HCT 25.8* 23.0* 25.3*  MCV 100.0  --  102.8*  PLT 242  --  228   Medications:    . apixaban  5 mg Oral BID  . aspirin  81 mg Oral q morning - 10a  . atorvastatin  20 mg Oral q1800  . calcitRIOL  0.25 mcg Oral Q M,W,F  . carvedilol  25 mg Oral BID WC  . Chlorhexidine Gluconate Cloth  6 each Topical Q0600  . cholecalciferol  4,000 Units Oral q morning - 10a  . [START ON 10/05/2019] darbepoetin (ARANESP) injection - NON-DIALYSIS  150 mcg Subcutaneous Q Fri-1800  . docusate  100 mg Oral BID  . feeding supplement (NEPRO CARB STEADY)  237 mL Oral BID BM  . fentaNYL (SUBLIMAZE) injection  25 mcg Intravenous Once  . gabapentin  100 mg Oral TID  . hydrALAZINE  100 mg Oral Q8H  . influenza vaccine adjuvanted  0.5 mL Intramuscular Tomorrow-1000  . insulin aspart  0-9 Units Subcutaneous TID WC  . insulin glargine  12 Units Subcutaneous QHS  . levothyroxine  25 mcg Oral Q0600  . mouth rinse  15 mL Mouth Rinse BID  . montelukast  10 mg Oral QHS  . multivitamin  15 mL Oral Daily  . mupirocin ointment  1 application Nasal BID  . neomycin-bacitracin-polymyxin  1 application Topical Daily  . pantoprazole  40 mg Oral Daily  . predniSONE  10 mg Oral Q breakfast  . primidone  50 mg Oral q morning - 10a  . pyridOXINE  200 mg Oral BID  . risperiDONE  0.5 mg Oral BID  . sodium chloride flush  3 mL Intravenous Q12H  . traZODone  50 mg Oral Daily   Elmarie Shiley, MD 09/30/2019, 11:13 AM

## 2019-09-30 NOTE — Progress Notes (Signed)
PROGRESS NOTE    [name]  [id number] DOB: 1951/01/25 DOA: 09/06/2019 PCP: Glenda Chroman, MD   Brief Narrative: Patient is a 69 year old female from group home with history of chronic kidney disease, COPD, diastolic CHF who presented with shortness of breath, generalized edema.  She was found to have fluid overload on presentation.  She was on acute hypoxic respiratpry failure due to pulmonary edema and had to be intubated.  Currently extubated and respiratory status stable.  Current issue is AKI on CKD which is likely most likely  progression of her underlying renal disease. Now she is in ESRD. Nephrology is closely following. Initiated hemodialysis on 09/25/2019.  Underwent  tunnel catheter and dialysis access placement by vascular surgery on 09/27/19.Outpatient dialysis facility has been found.  Waiting for placement in a skilled nursing facility.  She is medically stable for discharge from medical perspective.  Assessment & Plan:   Principal Problem:   Acute kidney injury superimposed on chronic kidney disease ([place]) Active Problems:   Type 2 diabetes mellitus with diabetic polyneuropathy, without long-term current use of insulin (HCC)   Normocytic anemia   Schizophrenia (HCC)   Depression   Essential hypertension   Chronic diastolic heart failure (HCC)   CKD (chronic kidney disease), stage IV (HCC)   Obesity, Class III, BMI 40-49.9 (morbid obesity) ([place])   Type 2 diabetes mellitus (HCC)   Hypothyroidism   Volume overload   On mechanically assisted ventilation (HCC)   Acute metabolic encephalopathy   Acute respiratory failure with hypoxia and hypercapnia (HCC)   SVT (supraventricular tachycardia) (HCC)   Acute pulmonary edema (HCC)   ESRD: Nephrology following.Initiated hemodialysis on 09/25/2019.  Underwent  tunnel catheter and AV fistula by vascular surgery on 09/27/19.    Outpatient dialysis unit has been confirmed.  Acute hypoxic/hypercarbic respiratory failure: Present  on admission.  Presented with anasarca.  She was intubated  on presentation.  Currently extubated and respiratory status stable.  Continue BiPAP at night.  Diastolic CHF/anasarca/pleural effusion: Diuretics discontinued.  Anticipate further improvement after hemodialysis.  Acute metabolic encephalopathy: Secondary to hypercarbia on presentation.  Resolved  Paroxysmal A. [id number] score 5.  On Eliquis 2.5 mg bID  Moderate protein calorie moderation: Dietitian following  Hypertension: Currently blood pressure stable.  Continue current regimen.  Hypothyroidism: Continue Synthyroid  Normocytic anemia: Associated with chronic kidney disease.  Continue to monitor.  H&H stable.  History of depression/schizophrenia: Continue current medications  Diabetes type 2: Continue current insulin regimen.  Monitor CBGs  Bilateral lower extremity pain: Most likely neuropathic.  Better now.  Continue gabapentin at current dose  Debility/deconditioning: Seen by PT/OT and recommended skilled nursing facility on discharge.    Nutrition Problem: Increased nutrient needs Etiology: acute illness(AKI superimposed on CKD requiring HD)      DVT prophylaxis: Eliquis Code Status: Full Family Communication: None present at the bedside Disposition Plan: Patient is from group home.  PT/  OT has recommended skilled facility.she is hemodynamically stable for discharge  to SNF as soon  as bed is available.  Consultants: Nephrology Procedures: Intubation/extubation/central line placement.  Antimicrobials:  Anti-infectives (From admission, onward)   Start     Dose/Rate Route Frequency Ordered Stop   09/27/19 0600  vancomycin (VANCOCIN) IVPB 1000 mg/200 mL premix     1,000 mg 200 mL/hr over 60 Minutes Intravenous To Surgery 09/26/19 0834 09/28/19 0600   09/26/19 0600  vancomycin (VANCOCIN) IVPB 1000 mg/200 mL premix  Status:  Discontinued     1,000 mg 200  mL/hr over 60 Minutes Intravenous To Surgery  09/25/19 1028 09/26/19 0843   09/14/19 1500  meropenem (MERREM) 500 mg in sodium chloride 0.9 % 100 mL IVPB  Status:  Discontinued     500 mg 200 mL/hr over 30 Minutes Intravenous Every 24 hours 09/14/19 1345 09/17/19 0821   09/14/19 1400  vancomycin (VANCOREADY) IVPB 2000 mg/400 mL     2,000 mg 200 mL/hr over 120 Minutes Intravenous  Once 09/14/19 1328 09/14/19 1832   09/14/19 1346  vancomycin variable dose per unstable renal function (pharmacist dosing)  Status:  Discontinued      Does not apply See admin instructions 09/14/19 1346 09/16/19 0929   09/14/19 0600  vancomycin (VANCOCIN) IVPB 1000 mg/200 mL premix  Status:  Discontinued     1,000 mg 200 mL/hr over 60 Minutes Intravenous On call to O.R. 09/13/19 1634 09/14/19 1219   09/11/19 1100  levofloxacin (LEVAQUIN) IVPB 500 mg  Status:  Discontinued     500 mg 100 mL/hr over 60 Minutes Intravenous Every 48 hours 09/09/19 1041 09/14/19 1259   09/09/19 1045  levofloxacin (LEVAQUIN) IVPB 750 mg     750 mg 100 mL/hr over 90 Minutes Intravenous  Once 09/09/19 1041 09/09/19 2208      Subjective:  Patient seen and examined the bedside this morning.  Hemodynamically stable.  Comfortable.  No active issues   Objective: Vitals:   09/29/19 1300 09/29/19 1810 09/29/19 2129 09/30/19 0441  BP: (!) 152/61 (!) 145/57 (!) 117/58 (!) 155/65  Pulse: 73 81 73 63  Resp:   14 15  Temp:  98.3 F (36.8 C) 98.1 F (36.7 C) 97.9 F (36.6 C)  TempSrc:  Oral Oral   SpO2:   92% 100%  Weight:   89.7 kg   Height:        Intake/Output Summary (Last 24 hours) at 09/30/2019 0800 Last data filed at 09/30/2019 0600 Gross per 24 hour  Intake 840 ml  Output 625 ml  Net 215 ml   Filed Weights   09/28/19 1245 09/28/19 2032 09/29/19 2129  Weight: 89.6 kg 89.7 kg 89.7 kg    Examination:   General exam: Comfortable HEENT:PERRL,Oral mucosa moist, Ear/Nose normal on gross exam Respiratory system: Bilateral equal air entry, normal vesicular breath  sounds, no wheezes or crackles  Cardiovascular system: S1 & S2 heard, RRR. No JVD, murmurs, rubs, gallops or clicks.  Dialysis catheter on the left neck Gastrointestinal system: Abdomen is nondistended, soft and nontender. No organomegaly or masses felt. Normal bowel sounds heard. Central nervous system: Alert and oriented. No focal neurological deficits. Extremities: No edema, no clubbing ,no cyanosis, AV fistula on the right arm  skin: No rashes, lesions or ulcers,no icterus ,no pallor    Data Reviewed: I have personally reviewed following labs and imaging studies  CBC: Recent Labs  Lab 09/24/19 0455 09/27/19 0719 09/28/19 0410  WBC 9.9  --  8.6  NEUTROABS  --   --  6.7  HGB 8.4* 7.8* 8.0*  HCT 25.8* 23.0* 25.3*  MCV 100.0  --  102.8*  PLT 242  --  [id number]   Basic Metabolic Panel: Recent Labs  Lab 09/23/19 0941 09/23/19 0941 09/24/19 0455 09/24/19 0455 09/25/19 0417 09/26/19 0548 09/27/19 0443 09/27/19 0719 09/28/19 0410  NA 142   < > 138   < > 137 142 141 138 140  K 3.6   < > 4.1   < > 4.2 3.8 4.2 4.1 4.6  CL 106   < >  100   < > 101 107 100 101 101  CO2 25   < > 25  --  24 26 27   --  28  GLUCOSE 212*   < > 146*   < > 156* 104* 125* 105* 175*  BUN 75*   < > 76*   < > 75* 33* 45* 40* 58*  CREATININE 5.15*   < > 5.43*   < > 5.71* 3.39* 4.58* 4.70* 5.15*  CALCIUM 8.4*   < > 8.5*  --  8.5* 8.5* 8.8*  --  8.7*  MG  --   --   --   --   --  2.2  --   --   --   PHOS 4.4  --  4.5  --  4.2 3.8 5.3*  --   --    < > = values in this interval not displayed.   GFR: Estimated Creatinine Clearance: 11 mL/min (A) (by C-G formula based on SCr of 5.15 mg/dL (H)). Liver Function Tests: Recent Labs  Lab 09/23/19 0941 09/24/19 0455 09/25/19 0417 09/26/19 0548 09/27/19 0443  ALBUMIN 2.0* 2.0* 2.0* 2.1* 2.1*   No results for input(s): LIPASE, AMYLASE in the last 168 hours. No results for input(s): AMMONIA in the last 168 hours. Coagulation Profile: No results for input(s): INR,  PROTIME in the last 168 hours. Cardiac Enzymes: No results for input(s): CKTOTAL, CKMB, CKMBINDEX, TROPONINI in the last 168 hours. BNP (last 3 results) No results for input(s): PROBNP in the last 8760 hours. HbA1C: No results for input(s): HGBA1C in the last 72 hours. CBG: Recent Labs  Lab 09/29/19 0756 09/29/19 1142 09/29/19 1619 09/29/19 2129 09/30/19 0654  GLUCAP 106* 246* 212* 284* 84   Lipid Profile: No results for input(s): CHOL, HDL, LDLCALC, TRIG, CHOLHDL, LDLDIRECT in the last 72 hours. Thyroid Function Tests: No results for input(s): TSH, T4TOTAL, FREET4, T3FREE, THYROIDAB in the last 72 hours. Anemia Panel: No results for input(s): VITAMINB12, FOLATE, FERRITIN, TIBC, IRON, RETICCTPCT in the last 72 hours. Sepsis Labs: No results for input(s): PROCALCITON, LATICACIDVEN in the last 168 hours.  Recent Results (from the past 240 hour(s))  Surgical PCR screen     Status: None   Collection Time: 09/26/19  7:51 PM   Specimen: Nasal Mucosa; Nasal Swab  Result Value Ref Range Status   MRSA, PCR NEGATIVE NEGATIVE Final   Staphylococcus aureus NEGATIVE NEGATIVE Final    Comment: (NOTE) The Xpert SA Assay (FDA approved for NASAL specimens in patients 37 years of age and older), is one component of a comprehensive surveillance program. It is not intended to diagnose infection nor to guide or monitor treatment. Performed at [facility], [place] [address], [place], [place] [address]          Radiology Studies: No results found.      Scheduled Meds: . apixaban  5 mg Oral BID  . aspirin  81 mg Oral q morning - 10a  . atorvastatin  20 mg Oral q1800  . calcitRIOL  0.25 mcg Oral Q M,W,F  . carvedilol  25 mg Oral BID WC  . Chlorhexidine Gluconate Cloth  6 each Topical Q0600  . cholecalciferol  4,000 Units Oral q morning - 10a  . [START ON 10/05/2019] darbepoetin (ARANESP) injection - NON-DIALYSIS  150 mcg Subcutaneous Q Fri-1800  . docusate  100 mg Oral BID    . feeding supplement (NEPRO CARB STEADY)  237 mL Oral BID BM  . fentaNYL (SUBLIMAZE)  injection  25 mcg Intravenous Once  . gabapentin  100 mg Oral TID  . hydrALAZINE  100 mg Oral Q8H  . influenza vaccine adjuvanted  0.5 mL Intramuscular Tomorrow-1000  . insulin aspart  0-9 Units Subcutaneous TID WC  . insulin glargine  12 Units Subcutaneous QHS  . levothyroxine  25 mcg Oral Q0600  . mouth rinse  15 mL Mouth Rinse BID  . montelukast  10 mg Oral QHS  . multivitamin  15 mL Oral Daily  . mupirocin ointment  1 application Nasal BID  . neomycin-bacitracin-polymyxin  1 application Topical Daily  . pantoprazole  40 mg Oral Daily  . predniSONE  10 mg Oral Q breakfast  . primidone  50 mg Oral q morning - 10a  . pyridOXINE  200 mg Oral BID  . risperiDONE  0.5 mg Oral BID  . sodium chloride flush  3 mL Intravenous Q12H  . traZODone  50 mg Oral Daily   Continuous Infusions: . sodium chloride    . sodium chloride    . sodium chloride    . sodium chloride Stopped (09/21/19 1105)  . sodium chloride    . sodium chloride       LOS: 24 days    Time spent: 25 mins. More than 50% of that time was spent in counseling and/or coordination of care.      Shelly Coss, MD [facility] P2/14/2021, 8:00 AM

## 2019-10-01 DIAGNOSIS — R0602 Shortness of breath: Secondary | ICD-10-CM | POA: Diagnosis not present

## 2019-10-01 DIAGNOSIS — E8779 Other fluid overload: Secondary | ICD-10-CM | POA: Diagnosis not present

## 2019-10-01 DIAGNOSIS — N179 Acute kidney failure, unspecified: Secondary | ICD-10-CM | POA: Diagnosis not present

## 2019-10-01 DIAGNOSIS — N189 Chronic kidney disease, unspecified: Secondary | ICD-10-CM | POA: Diagnosis not present

## 2019-10-01 LAB — RENAL FUNCTION PANEL
Albumin: 2.1 g/dL — ABNORMAL LOW (ref 3.5–5.0)
Anion gap: 13 (ref 5–15)
BUN: 55 mg/dL — ABNORMAL HIGH (ref 8–23)
CO2: 25 mmol/L (ref 22–32)
Calcium: 8.5 mg/dL — ABNORMAL LOW (ref 8.9–10.3)
Chloride: 100 mmol/L (ref 98–111)
Creatinine, Ser: 4.92 mg/dL — ABNORMAL HIGH (ref 0.44–1.00)
GFR calc Af Amer: 10 mL/min — ABNORMAL LOW (ref >60)
GFR calc non Af Amer: 8 mL/min — ABNORMAL LOW (ref >60)
Glucose, Bld: 118 mg/dL — ABNORMAL HIGH (ref 70–99)
Phosphorus: 5.1 mg/dL — ABNORMAL HIGH (ref 2.5–4.6)
Potassium: 3.5 mmol/L (ref 3.5–5.1)
Sodium: 138 mmol/L (ref 135–145)

## 2019-10-01 LAB — GLUCOSE, CAPILLARY
Glucose-Capillary: 104 mg/dL — ABNORMAL HIGH (ref 70–99)
Glucose-Capillary: 122 mg/dL — ABNORMAL HIGH (ref 70–99)
Glucose-Capillary: 281 mg/dL — ABNORMAL HIGH (ref 70–99)

## 2019-10-01 LAB — CBC
HCT: 25.3 % — ABNORMAL LOW (ref 36.0–46.0)
Hemoglobin: 8 g/dL — ABNORMAL LOW (ref 12.0–15.0)
MCH: 32.4 pg (ref 26.0–34.0)
MCHC: 31.6 g/dL (ref 30.0–36.0)
MCV: 102.4 fL — ABNORMAL HIGH (ref 80.0–100.0)
Platelets: 192 10*3/uL (ref 150–400)
RBC: 2.47 MIL/uL — ABNORMAL LOW (ref 3.87–5.11)
RDW: 14.9 % (ref 11.5–15.5)
WBC: 9.5 10*3/uL (ref 4.0–10.5)
nRBC: 0 % (ref 0.0–0.2)

## 2019-10-01 MED ORDER — TRAMADOL HCL 50 MG PO TABS: 50.0000 mg | ORAL_TABLET | Freq: Four times a day (QID) | ORAL | 0 refills | Status: DC | PRN

## 2019-10-01 MED ORDER — APIXABAN 5 MG PO TABS: 5.0000 mg | ORAL_TABLET | Freq: Two times a day (BID) | ORAL | Status: DC

## 2019-10-01 MED ORDER — CALCITRIOL 0.25 MCG PO CAPS: 0.2500 ug | ORAL_CAPSULE | ORAL | Status: DC

## 2019-10-01 MED ORDER — HEPARIN SODIUM (PORCINE) 1000 UNIT/ML DIALYSIS: 40.0000 [IU]/kg | INTRAMUSCULAR | Status: DC | PRN

## 2019-10-01 MED ORDER — LORAZEPAM 1 MG PO TABS: 1.0000 mg | ORAL_TABLET | Freq: Every day | ORAL | 0 refills | Status: DC | PRN

## 2019-10-01 MED ORDER — CARVEDILOL 25 MG PO TABS: 25.0000 mg | ORAL_TABLET | Freq: Two times a day (BID) | ORAL | Status: DC

## 2019-10-01 MED ORDER — GABAPENTIN 100 MG PO CAPS: 100.0000 mg | ORAL_CAPSULE | Freq: Three times a day (TID) | ORAL | Status: DC

## 2019-10-01 MED ORDER — HEPARIN SODIUM (PORCINE) 1000 UNIT/ML IJ SOLN
INTRAMUSCULAR | Status: AC
  Filled 2019-10-01: qty 4

## 2019-10-01 MED ORDER — DOCUSATE SODIUM 100 MG PO CAPS
100.0000 mg | ORAL_CAPSULE | Freq: Two times a day (BID) | ORAL | Status: DC
  Administered 2019-10-01: 100 mg via ORAL
  Filled 2019-10-01: qty 1

## 2019-10-01 MED ORDER — HYDRALAZINE HCL 100 MG PO TABS: 100.0000 mg | ORAL_TABLET | Freq: Three times a day (TID) | ORAL | Status: DC

## 2019-10-01 NOTE — Progress Notes (Signed)
Physical Therapy Treatment Patient Details Name: [name] MRN: [id number] DOB: 03-27-1951 Today's Date: 10/01/2019    History of Present Illness 69 y.o. female   HTN, DM (on trulicity), hyperlipidemia, diastolic heart failure,  schizoaffective D/O with episodes of catatonia and sz d/o, COPD, CKD admitted with progressive wt gain, leg swelling sob with w/u c/w anasarca / bilateral effusions  more sob 1/23 and required intubation / mech with CTa neg PE, positive for mild bilateral pleural effusions with small lung voumes.  Extubated 1/29, then re-intubated 1/31 due to stridor, extubated 2/4.    PT Comments    Pt was seen for mobility in bed, noting improvement in her control of both scooting and rolling.  Her plan is to go to SNF per rehab,  Since pt is dependent on assist for all mobility and all balance skills.  Her hand dexterity is also limited, and is unable to fully handle her tray without help for lunch.  Follow acutely if dc is not completed today.  Focus on PT for balance, standing endurance and LE strength.  Follow Up Recommendations  SNF     Equipment Recommendations  None recommended by PT    Recommendations for Other Services       Precautions / Restrictions Precautions Precautions: Fall Precaution Comments: Left IJ cath 2/11 Restrictions Weight Bearing Restrictions: No    Mobility  Bed Mobility Overal bed mobility: Needs Assistance Bed Mobility: Rolling(scooting up ) Rolling: Min assist(scooting max of one with trendelenburg)            Transfers                    Ambulation/Gait                 Stairs             Wheelchair Mobility    Modified Rankin (Stroke Patients Only)       Balance                                            Cognition Arousal/Alertness: Awake/alert Behavior During Therapy: Flat affect Overall Cognitive Status: No family/caregiver present to determine baseline cognitive  functioning                                 General Comments: slow verbal response to questions      Exercises      General Comments        Pertinent Vitals/Pain Pain Assessment: No/denies pain    Home Living                      Prior Function            PT Goals (current goals can now be found in the care plan section) Acute Rehab PT Goals Patient Stated Goal: to get stronger Progress towards PT goals: Progressing toward goals    Frequency    Min 3X/week      PT Plan Current plan remains appropriate    Co-evaluation              AM-PAC PT "6 Clicks" Mobility   Outcome Measure  Help needed turning from your back to your side while in a flat bed without using bedrails?: A Little Help needed moving  from lying on your back to sitting on the side of a flat bed without using bedrails?: A Little Help needed moving to and from a bed to a chair (including a wheelchair)?: A Little Help needed standing up from a chair using your arms (e.g., wheelchair or bedside chair)?: A Little Help needed to walk in hospital room?: A Little Help needed climbing 3-5 steps with a railing? : A Lot 6 Click Score: 17    End of Session   Activity Tolerance: Patient limited by fatigue;Treatment limited secondary to medical complications (Comment) Patient left: in bed;with call bell/phone within reach;with bed alarm set Nurse Communication: Mobility status PT Visit Diagnosis: Other abnormalities of gait and mobility (R26.89);Muscle weakness (generalized) (M62.81)     Time: [id number] PT Time Calculation (min) (ACUTE ONLY): 24 min  Charges:  $Therapeutic Activity: 23-37 mins                   Ramond Dial 10/01/2019, 1:31 PM  Mee Hives, PT MS Acute Rehab Dept. Number: [place] and [place]

## 2019-10-01 NOTE — TOC Transition Note (Signed)
Transition of Care [facility]) - CM/SW Discharge Note *2/15 - Discharged to [facility] via non-emergency ambulance   Patient Details  Name: [name] MRN: [id number] Date of Birth: 11-15-1950  [facility] of [facility]) CM/SW Contact:  Sable Feil, LCSW Phone Number: 10/01/2019, 6:05 PM   Clinical Narrative: Patient discharged to [facility] as determined on 2/12 by facility. Discharge clinicals transmitted to facility, patient informed of her discharge today and non-emergency transport arranged. Nurse provided with information to call report to facility nurse.       Final next level of care: Skilled Nursing Facility([facility]) Barriers to Discharge: Barriers Resolved   Patient Goals and CMS Choice Patient states their goals for this hospitalization and ongoing recovery are:: Patient in agreemernt with ST rehab to get stronger before returning to [place] CMS Medicare.gov Compare Post Acute Care list provided to:: Other (Comment Required)(Patient provided with name of faciity that made bed offer.) Choice offered to / list presented to : (Patient informed of bed offer received)  Discharge Placement PASRR number recieved: 09/28/19            Patient chooses bed at: [facility] only bed offer) Patient to be transferred to facility by: Non-emergency ambulance Name of family member notified: Per patient report - has no family Patient and family notified of of transfer: 10/01/19(Patient informed on 2/15)  Discharge Plan and Services In-house Referral: Clinical Social Work                                   Social Determinants of Health (SDOH) Interventions  No SDOH interventions needed prior to discharge.   Readmission Risk Interventions Readmission Risk Prevention Plan 09/10/2019 11/29/2018  Transportation Screening Complete Complete  Medication Review Press photographer) Complete Complete  PCP or Specialist appointment within  3-5 days of discharge - Not Complete  PCP/Specialist Appt Not Complete comments - earliest appt available was 12/08/18  [place] or [place] - Complete  SW Recovery Care/Counseling Consult - Complete  [place] - Not Applicable  Some recent data might be hidden

## 2019-10-01 NOTE — Discharge Summary (Signed)
Physician Discharge Summary  [name] [id number] DOB: [birth year] DOA: 09/06/2019  PCP: Glenda Chroman, MD  Admit date: 09/06/2019 Discharge date: 10/01/2019  Admitted From: Group Home Disposition:  SNF  Discharge Condition:Stable CODE STATUS:FULL Diet recommendation: Heart Healthy  Brief/Interim Summary:  Patient is a 69 year old female from group home with history of chronic kidney disease, COPD, diastolic CHF who presented with shortness of breath, generalized edema.  She was found to have fluid overload on presentation.  She was on acute hypoxic respiratory failure due to pulmonary edema and had to be intubated.  Currently extubated and respiratory status stable.  She progressed into ESRD. Nephrology was closely following. Initiated hemodialysis on 09/25/2019.  Underwent  tunnel catheter and dialysis access placement by vascular surgery on 09/27/19.Outpatient dialysis facility has been found at [facility] on MWF schedule.  PT/OT recommended skilled nursing facility.  She is hemodynamically stable for discharge to home today.  Following problems were addressed during hospitalization:  ESRD: Nephrology was following.Initiated hemodialysis on 09/25/2019.  Underwent  tunnel catheter and AV fistula by vascular surgery on 09/27/19.    Outpatient dialysis unit has been confirmed.  Acute hypoxic/hypercarbic respiratory failure: Present on admission.  Presented with anasarca.  She was intubated  on presentation.  Currently extubated and respiratory status stable.  Continue BiPAP at night.  Diastolic CHF/anasarca/pleural effusion: Diuretics discontinued.    Acute metabolic encephalopathy: Secondary to hypercarbia on presentation.  Resolved  Paroxysmal A. [id number] score 5.  On Eliquis 2.5 mg bID  Moderate protein calorie moderation: Dietitian following  Hypertension: Currently blood pressure stable.  Continue current regimen.  Hypothyroidism: Continue  Synthyroid  Normocytic anemia: Associated with chronic kidney disease.  Continue to monitor.  H&H stable.  History of depression/schizophrenia: Continue current medications  Diabetes type 2: resume home regimen  Bilateral lower extremity pain: Most likely neuropathic.  Better now.  Continue gabapentin at current dose  Debility/deconditioning: Seen by PT/OT and recommended skilled nursing facility on discharge.    Discharge Diagnoses:  Principal Problem:   Acute kidney injury superimposed on chronic kidney disease ([place]) Active Problems:   Type 2 diabetes mellitus with diabetic polyneuropathy, without long-term current use of insulin (HCC)   Normocytic anemia   Schizophrenia (HCC)   Depression   Essential hypertension   Chronic diastolic heart failure (HCC)   CKD (chronic kidney disease), stage IV (HCC)   Obesity, Class III, BMI 40-49.9 (morbid obesity) ([place])   Type 2 diabetes mellitus (HCC)   Hypothyroidism   Volume overload   On mechanically assisted ventilation (HCC)   Acute metabolic encephalopathy   Acute respiratory failure with hypoxia and hypercapnia (HCC)   SVT (supraventricular tachycardia) ([place])   Acute pulmonary edema (HCC)    Discharge Instructions  Discharge Instructions    Diet - low sodium heart healthy   Complete by: As directed    Discharge instructions   Complete by: As directed    1)Please follow up with vascular surgery as an outpatient.  Name and number of  the provider has been attached.   Increase activity slowly   Complete by: As directed      Allergies as of 10/01/2019      Reactions   Benztropine Mesylate Other (See Comments)   Alopecia and white spots on eyes   Codeine Swelling, Palpitations, Other (See Comments)   Mouth Swelling and Tachycardia   Diazepam Other (See Comments)   COMA   Diphenhydramine Hcl Anxiety, Palpitations, Other (See Comments)   Tachycardia and anxiousness  Penicillins Swelling, Other (See Comments)    REACTION: ulcers in mouth, swelling, throat swelling  Has patient had a PCN reaction causing immediate rash, facial/tongue/throat swelling, SOB or lightheadedness with hypotension: YES Has patient had a PCN reaction causing severe rash involving Mucus membranes or skin necrosis: Unknown Has patient had a PCN reaction that required hospitalization Unknown Has patient had a PCN reaction occurring within the last 10 years: unknown If all of the above answers are "NO", then may proceed with Cephalospori   Sulfonamide Derivatives Rash   Blisters, also   Thioridazine Hcl Swelling   Lisinopril Cough      Medication List    STOP taking these medications   amLODipine 5 MG tablet Commonly known as: NORVASC   levofloxacin 500 MG tablet Commonly known as: LEVAQUIN   prazosin 2 MG capsule Commonly known as: MINIPRESS   torsemide 20 MG tablet Commonly known as: DEMADEX     TAKE these medications   acetaminophen 325 MG tablet Commonly known as: TYLENOL Take 325 mg by mouth 2 (two) times daily.   albuterol (2.5 MG/3ML) 0.083% nebulizer solution Commonly known as: PROVENTIL Take 2.5 mg by nebulization every 6 (six) hours as needed for wheezing or shortness of breath.   albuterol 108 (90 Base) MCG/ACT inhaler Commonly known as: VENTOLIN HFA Inhale 1 puff into the lungs every 6 (six) hours as needed for wheezing or shortness of breath.   apixaban 5 MG Tabs tablet Commonly known as: ELIQUIS Take 1 tablet (5 mg total) by mouth 2 (two) times daily.   aspirin 81 MG tablet Take 81 mg by mouth every morning.   atorvastatin 20 MG tablet Commonly known as: LIPITOR Take 1 tablet by mouth at bedtime.   calcitRIOL 0.25 MCG capsule Commonly known as: ROCALTROL Take 1 capsule (0.25 mcg total) by mouth every Monday, Wednesday, and Friday.   calcium carbonate 500 MG chewable tablet Commonly known as: TUMS - dosed in mg elemental calcium Chew 1 tablet by mouth daily as needed for indigestion or  heartburn.   carvedilol 25 MG tablet Commonly known as: COREG Take 1 tablet (25 mg total) by mouth 2 (two) times daily with a meal. What changed:   medication strength  how much to take   DAILY VITE PO Take 1 tablet by mouth every morning.   Dextromethorphan-guaiFENesin 10-100 MG/5ML liquid Take 5 mLs by mouth daily as needed (cough).   docusate sodium 100 MG capsule Commonly known as: COLACE Take 1 capsule (100 mg total) by mouth 2 (two) times daily. Hold for diarrhea What changed:   when to take this  reasons to take this   DULoxetine 60 MG capsule Commonly known as: CYMBALTA Take 1 capsule by mouth daily.   EPINEPHrine 0.3 mg/0.3 mL Devi Commonly known as: EPI-PEN Inject 0.3 mg into the muscle once as needed (for bee stings, then go to the ED immediately after using).   gabapentin 100 MG capsule Commonly known as: NEURONTIN Take 1 capsule (100 mg total) by mouth 3 (three) times daily. What changed:   medication strength  how much to take  when to take this   hydrALAZINE 100 MG tablet Commonly known as: APRESOLINE Take 1 tablet (100 mg total) by mouth every 8 (eight) hours.   hydrOXYzine 25 MG tablet Commonly known as: ATARAX/VISTARIL Take 25 mg by mouth 2 (two) times daily as needed for anxiety.   levothyroxine 25 MCG tablet Commonly known as: SYNTHROID Take 1 tablet (25 mcg total) by mouth  daily at 6 (six) AM.   linagliptin 5 MG Tabs tablet Commonly known as: TRADJENTA Take 5 mg by mouth every morning.   LORazepam 1 MG tablet Commonly known as: ATIVAN Take 1 tablet (1 mg total) by mouth daily as needed for anxiety.   montelukast 10 MG tablet Commonly known as: SINGULAIR Take 10 mg by mouth at bedtime.   omeprazole 20 MG capsule Commonly known as: PRILOSEC Take 20 mg by mouth every morning.   predniSONE 10 MG tablet Commonly known as: DELTASONE Take 10 mg by mouth at bedtime.   primidone 50 MG tablet Commonly known as: MYSOLINE Take 50  mg by mouth every morning.   pyridOXINE 100 MG tablet Commonly known as: VITAMIN B-6 Take 200 mg by mouth 2 (two) times daily.   risperiDONE 0.5 MG tablet Commonly known as: RISPERDAL Take 1 tablet by mouth 2 (two) times daily.   sodium phosphate 7-19 GM/118ML Enem Place 1 enema rectally daily as needed for mild constipation or severe constipation.   traMADol 50 MG tablet Commonly known as: ULTRAM Take 1 tablet (50 mg total) by mouth every 6 (six) hours as needed for moderate pain.   traZODone 50 MG tablet Commonly known as: DESYREL Take 1 tablet by mouth daily.   Tyler Aas FlexTouch 100 UNIT/ML Sopn FlexTouch Pen Generic drug: insulin degludec Inject 18 Units into the skin at bedtime.   Trulicity 1.5 0000000 Sopn Generic drug: Dulaglutide Inject 1.5 mg into the skin every Friday.   vitamin A & D ointment Apply 1 application topically See admin instructions. To be applied after baths in the morning due to rash under skin folds   Vitamin D3 50 MCG (2000 UT) Tabs Take 4,000 Units by mouth every morning.      Follow-up Information    Waynetta Sandy, MD In 6 weeks.   Specialties: Vascular Surgery, Cardiology Why: Office will call you to arrange your appt (sent) Contact information: [address] [place]  [address] [phone]          Allergies  Allergen Reactions  . Benztropine Mesylate Other (See Comments)    Alopecia and white spots on eyes  . Codeine Swelling, Palpitations and Other (See Comments)    Mouth Swelling and Tachycardia   . Diazepam Other (See Comments)    COMA  . Diphenhydramine Hcl Anxiety, Palpitations and Other (See Comments)    Tachycardia and anxiousness  . Penicillins Swelling and Other (See Comments)    REACTION: ulcers in mouth, swelling, throat swelling  Has patient had a PCN reaction causing immediate rash, facial/tongue/throat swelling, SOB or lightheadedness with hypotension: YES Has patient had a PCN reaction causing  severe rash involving Mucus membranes or skin necrosis: Unknown Has patient had a PCN reaction that required hospitalization Unknown Has patient had a PCN reaction occurring within the last 10 years: unknown If all of the above answers are "NO", then may proceed with Cephalospori  . Sulfonamide Derivatives Rash    Blisters, also  . Thioridazine Hcl Swelling  . Lisinopril Cough    Consultations:  Nephrology   Procedures/Studies: CT HEAD WO CONTRAST  Result Date: 09/11/2019 CLINICAL DATA:  Onset left-side neglect today. EXAM: CT HEAD WITHOUT CONTRAST TECHNIQUE: Contiguous axial images were obtained from the base of the skull through the vertex without intravenous contrast. COMPARISON:  Head CT 09/08/2019.  Brain MRI 09/27/2018. FINDINGS: Brain: No evidence of acute infarction, hemorrhage, hydrocephalus, extra-axial collection or mass lesion/mass effect. Vascular: No hyperdense vessel.  Atherosclerosis noted. Skull:  Intact.  No focal lesion. Sinuses/Orbits: Small right mastoid effusion is seen. Mucous retention cyst or polyp left maxillary sinus noted. Mild mucosal thickening scattered ethmoid air cells, sphenoid sinuses and right maxillary sinus also noted. Other: None. IMPRESSION: No acute abnormality finding to explain the patient's symptoms. Atherosclerosis. Mild sinus disease. Electronically Signed   By: Inge Rise M.D.   On: 09/11/2019 15:41   CT HEAD WO CONTRAST  Result Date: 09/08/2019 CLINICAL DATA:  Altered mental status. EXAM: CT HEAD WITHOUT CONTRAST TECHNIQUE: Contiguous axial images were obtained from the base of the skull through the vertex without intravenous contrast. COMPARISON:  September 15, 2018 FINDINGS: Brain: There is mild cerebral atrophy with widening of the extra-axial spaces and ventricular dilatation. There are areas of decreased attenuation within the white matter tracts of the supratentorial brain, consistent with microvascular disease changes. A chronic left  basal ganglia lacunar infarct is noted. Vascular: No hyperdense vessel or unexpected calcification. Skull: Normal. Negative for fracture or focal lesion. Sinuses/Orbits: A stable 2.0 cm x 1.4 cm polyp versus mucous retention cyst is seen within the anterior aspect of the left maxillary sinus. Other: None. IMPRESSION: 1. Generalized cerebral atrophy. 2. No acute intracranial abnormality. 3. Chronic left basal ganglia lacunar infarct. 4. Stable left maxillary sinus polyp versus mucous retention cyst. Electronically Signed   By: Virgina Norfolk M.D.   On: 09/08/2019 16:48   CT ANGIO CHEST PE W OR WO CONTRAST  Result Date: 09/08/2019 CLINICAL DATA:  Severe hypoxia. EXAM: CT ANGIOGRAPHY CHEST WITH CONTRAST TECHNIQUE: Multidetector CT imaging of the chest was performed using the standard protocol during bolus administration of intravenous contrast. Multiplanar CT image reconstructions and MIPs were obtained to evaluate the vascular anatomy. CONTRAST:  178mL OMNIPAQUE IOHEXOL 300 MG/ML  SOLN COMPARISON:  Jan 12, 2018 FINDINGS: Cardiovascular: There is mild calcification of the aortic arch. Satisfactory opacification of the pulmonary arteries to the segmental level. No evidence of pulmonary embolism. There is mild, stable cardiomegaly. No pericardial effusion. Marked severity coronary artery calcification is seen. Mediastinum/Nodes: No enlarged mediastinal, hilar, or axillary lymph nodes. Thyroid gland, trachea, and esophagus demonstrate no significant findings. Lungs/Pleura: Endotracheal and nasogastric tubes are present. Moderate severity atelectasis and/or infiltrate is seen within the posterior aspect of the left upper lobe and left lower lobe with marked severity consolidation seen throughout the entire right lower lobe. Moderate size bilateral pleural effusions are seen, right greater than left. No pneumothorax is seen. Upper Abdomen: No acute abnormality. Musculoskeletal: Multilevel degenerative changes seen  throughout the thoracic spine Review of the MIP images confirms the above findings. IMPRESSION: 1. No CT evidence of acute pulmonary embolism. 2. Moderate severity left upper lobe and right lower lobe atelectasis and/or infiltrate with marked severity consolidation seen throughout the entire right lower lobe. 3. Moderate size bilateral pleural effusions, right greater than left. Electronically Signed   By: Virgina Norfolk M.D.   On: 09/08/2019 22:13   US RENAL  Result Date: 09/06/2019 CLINICAL DATA:  Acute kidney injury EXAM: RENAL / URINARY TRACT ULTRASOUND COMPLETE COMPARISON:  CT abdomen pelvis 06/01/2018 FINDINGS: Right Kidney: Renal measurements: 8.2 x 4.5 x 5.3 cm = volume: 102 mL. Poor visualization of RIGHT kidney due to body habitus. Increased cortical echogenicity. Cortical thinning. No gross evidence of mass or hydronephrosis on limited assessment Left Kidney: Renal measurements: 9.4 x 4.5 x 5.3 cm = volume: 130 mL. Cortical thinning with increased cortical echogenicity. No mass or hydronephrosis. Bladder: Well distended, unremarkable.  Patient was unable to  void. Other: N/A IMPRESSION: Limited visualization of RIGHT kidney due to body habitus. BILATERAL renal cortical atrophy and medical renal disease changes. No gross evidence of mass or hydronephrosis within limitations of exam. Electronically Signed   By: Lavonia Dana M.D.   On: 09/06/2019 10:47   DG Chest Port 1 View  Result Date: 09/17/2019 CLINICAL DATA:  Respiratory failure. EXAM: PORTABLE CHEST 1 VIEW COMPARISON:  09/16/2019 FINDINGS: The endotracheal tube is 12 mm above the carina. The NG tube is coursing down the esophagus and into the stomach. Moderate central vascular congestion, pulmonary edema and bilateral pleural effusions with overlying atelectasis, right greater than left. IMPRESSION: 1. Stable support apparatus. 2. Moderate central vascular congestion, probable perihilar pulmonary edema and persistent effusions and overlying  atelectasis. Electronically Signed   By: Marijo Sanes M.D.   On: 09/17/2019 07:03   DG CHEST PORT 1 VIEW  Result Date: 09/16/2019 CLINICAL DATA:  Oxygen desaturation. EXAM: PORTABLE CHEST 1 VIEW COMPARISON:  September 14, 2019 FINDINGS: The ETT terminates 9 mm above the carina. The NG tube terminates in the right mid abdomen. Small bilateral layering effusions with underlying opacities, likely atelectasis. No pneumothorax. No other interval changes. IMPRESSION: 1. The ETT terminates 9 mm above the carina. Recommend withdrawing 2 cm. The NG tube terminates in either the distal stomach or proximal duodenum. 2. Small bilateral pleural effusions with underlying opacities, probably atelectasis. 3. No other acute abnormalities or changes. These results will be called to the ordering clinician or representative by the Radiologist Assistant, and communication documented in the PACS or zVision Dashboard. Electronically Signed   By: Dorise Bullion III M.D   On: 09/16/2019 16:47   DG Chest Port 1 View  Result Date: 09/14/2019 CLINICAL DATA:  Attempted placement of dialysis catheter. EXAM: PORTABLE CHEST 1 VIEW COMPARISON:  One-view chest x-ray 09/13/2019 FINDINGS: The heart is enlarged. Patient remains intubated. NG tube terminates in the stomach. Right pleural effusion is noted within the right minor fissure and at the apex, likely redistributed due to positioning. Increased right upper lobe airspace disease is present. The right base is clear. The left lung is mostly clear. IMPRESSION: 1. Increased right upper lobe airspace disease concerning for pneumonia. 2. Small right pleural effusion. 3. Cardiomegaly without failure. Electronically Signed   By: San Morelle M.D.   On: 09/14/2019 12:33   DG Chest Port 1 View  Result Date: 09/13/2019 CLINICAL DATA:  Respiratory failure with hypoxemia. EXAM: PORTABLE CHEST 1 VIEW COMPARISON:  Chest radiograph 09/10/2019, chest CT 09/08/2019 FINDINGS: ET tube is  unchanged position with tip terminating at the level of the clavicular heads. An enteric tube passes below level of left hemidiaphragm with tip excluded from the field of view. Overlying cardiac monitoring leads. Unchanged cardiomediastinal silhouette with cardiomegaly. Right greater than left pleural effusions and bibasilar atelectasis, similar to prior examination. No evidence of pneumothorax. IMPRESSION: Support apparatus as described. Similar appearance of right greater than left pleural effusions and bibasilar atelectasis. Pneumonia at the lung bases cannot be excluded. Unchanged cardiomegaly. Electronically Signed   By: Kellie Simmering DO   On: 09/13/2019 07:13   DG CHEST PORT 1 VIEW  Result Date: 09/10/2019 CLINICAL DATA:  Acute respiratory failure with hypoxia EXAM: PORTABLE CHEST 1 VIEW COMPARISON:  Yesterday FINDINGS: Small pleural effusions with superimposed lower lobe atelectasis by most recent chest CT. Cardiomegaly. Endotracheal tube tip is at the clavicular heads, slightly higher than before. The enteric tube at least reaches the stomach. IMPRESSION: Continued low volume  chest with layering effusions and lower lobe atelectasis. Electronically Signed   By: Monte Fantasia M.D.   On: 09/10/2019 05:45   Portable Chest xray  Result Date: 09/09/2019 CLINICAL DATA:  Respiratory failure EXAM: PORTABLE CHEST 1 VIEW COMPARISON:  CTA chest dated 09/08/2019 FINDINGS: Endotracheal tube terminates 2.5 cm above the carina. Enteric tube courses into the stomach. Moderate layering right pleural effusion. Associated right lower lobe opacity, likely atelectasis. Small left pleural effusion. Pulmonary vascular congestion without frank interstitial edema. Cardiomegaly. IMPRESSION: Endotracheal tube terminates 2.5 cm above the carina. Cardiomegaly with pulmonary vascular congestion. No frank interstitial edema. Moderate right and small left pleural effusions. Associated right lower lobe opacity, likely atelectasis.  Electronically Signed   By: Julian Hy M.D.   On: 09/09/2019 07:10   DG CHEST PORT 1 VIEW  Result Date: 09/08/2019 CLINICAL DATA:  Endotracheal tube placement EXAM: PORTABLE CHEST 1 VIEW COMPARISON:  09/18/2018.  09/08/2019 FINDINGS: The heart size remains enlarged. Endotracheal tube terminates approximately 3.7 cm above the carina. The enteric tube extends below the left hemidiaphragm. Moderate to large bilateral pleural effusions are noted. Bibasilar airspace disease is noted which is favored to represent atelectasis. There is no pneumothorax. Vascular congestion is noted. IMPRESSION: 1. Lines and tubes as above. 2. Persistent findings of congestive heart failure with moderate to large bilateral pleural effusions. Electronically Signed   By: Constance Holster M.D.   On: 09/08/2019 17:23   DG CHEST PORT 1 VIEW  Result Date: 09/08/2019 CLINICAL DATA:  Status post intubation. EXAM: PORTABLE CHEST 1 VIEW COMPARISON:  September 06, 2019. FINDINGS: An endotracheal tube is seen with its distal tip approximately 1.7 cm from the carina. This represents a new finding when compared to the prior study. A nasogastric tube is seen with its distal end extending below the level of the diaphragm. This also represents a new finding. There is no evidence of focal consolidation, pleural effusion or pneumothorax. The cardiac silhouette is moderately enlarged. Multilevel degenerative changes seen throughout the thoracic spine. IMPRESSION: 1. Interval endotracheal tube and nasogastric tube placement and positioning, as described above, when compared to the prior study dated September 06, 2019. Electronically Signed   By: Virgina Norfolk M.D.   On: 09/08/2019 16:24   DG Chest Port 1 View  Result Date: 09/06/2019 CLINICAL DATA:  Shortness of breath. EXAM: PORTABLE CHEST 1 VIEW COMPARISON:  One-view chest x-ray 08/29/2018 FINDINGS: Heart is enlarged. Mild interstitial opacities slightly more prominent than on the prior  study. Heart size and interstitial pattern is exaggerated by low lung volumes. No significant airspace consolidation is present. No significant effusions are present. IMPRESSION: 1. Cardiomegaly with mild interstitial edema suggesting congestive heart failure. 2. Low lung volumes. Electronically Signed   By: San Morelle M.D.   On: 09/06/2019 04:29   DG Chest Port 1V same Day  Result Date: 09/27/2019 CLINICAL DATA:  Postoperative state. EXAM: PORTABLE CHEST 1 VIEW COMPARISON:  09/17/2019 FINDINGS: Left jugular dialysis catheter in place. The catheter tip is in the upper SVC region. Heart size is upper limits of normal but stable. Improved aeration in both lungs compared to the previous examination. No significant airspace disease. Central vascular structures are mildly enlarged. Negative for a pneumothorax. IMPRESSION: 1. Placement of left jugular dialysis catheter. Catheter tip in the upper SVC region. 2. Mildly prominent central vascular structures without pulmonary edema. Negative for a pneumothorax. Electronically Signed   By: Markus Daft M.D.   On: 09/27/2019 09:42   DG Swallowing Func-Speech  Pathology  Result Date: 09/22/2019 Objective Swallowing Evaluation: Type of Study: MBS-Modified Barium Swallow Study  Patient Details Name: Jariah Cookston MRN: [id number] Date of Birth: 02-Nov-1950 Today's Date: 09/22/2019 Time: SLP Start Time (ACUTE ONLY): 1145 -SLP Stop Time (ACUTE ONLY): 1155 SLP Time Calculation (min) (ACUTE ONLY): 10 min Past Medical History: Past Medical History: Diagnosis Date . Anxiety  . Asthma  . Back pain  . Barrett esophagus  . CHF (congestive heart failure) ([place])  . CKD (chronic kidney disease) stage 3, GFR 30-59 ml/min  . COPD (chronic obstructive pulmonary disease) ([place])  . Depression  . Diastolic heart failure ([place])  . Essential hypertension  . Fibromyalgia  . GERD (gastroesophageal reflux disease)  . Gout  . History of pericarditis  . Hyperlipidemia  . Hypothyroid  . Major  depressive disorder  . Memory loss  . Nephrolithiasis   2008 . Obesity hypoventilation syndrome (HCC)   CPAP . Obstructive sleep apnea  . PE (pulmonary embolism)   2010 . Peripheral neuropathy  . PTSD (post-traumatic stress disorder)  . Rheumatoid arthritis ([place])  . Schizophrenia ([place])  . 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 Steroid dependence ([place])  . Type 2 diabetes mellitus ([place])  . Vitamin D deficiency  Past Surgical History: Past Surgical History: Procedure Laterality Date . APPENDECTOMY   . CHOLECYSTECTOMY   . COLONOSCOPY    benign polyps (12/2000), repeat colonoscopy performed in 2007 . ENDOMETRIAL BIOPSY    10/03 benign columnar mucosa (limited material) . INSERTION OF DIALYSIS CATHETER Right 09/14/2019  Procedure: ATTEMPTED INSERTION OF DIALYSIS CATHETER;  Surgeon: Virl Cagey, MD;  Location: [facility];  Service: General;  Laterality: Right; . KIDNEY SURGERY   HPI: 67 with history of remote smoker, COPD, GERD, dysphagia, fibromyalgia, Barretts esophagus. admitted with progressive wt gain, leg swelling sob with w/u c/w anasarca / bilateral effusions. Placement of HD catheter 1/25. Required intubation 1/23-1/30, reintubated 1/31-2/4. MBS 08/17/18 recommending Dys 3 texture, thin liquids with chin tuck. BSE 09/16/19 recommended continue NPO allowing ice chips and MBS planned 2/1 however pt was reintubated 1/31. CXR Moderate central vascular congestion, pulmonary edema and bilateral pleural effusions with overlying atelectasis, right greater than left.  Subjective: pleasant, cooperative Assessment / Plan / Recommendation CHL IP CLINICAL IMPRESSIONS 09/22/2019 Clinical Impression Patient presents with mild oral and a primary sensorimotor based pharyngeal dysphagia. She exhibited deep penetration to vocal cords with first sip of thin liquids and with trace amount of that penetrate resulting in trace aspiration. (View was slightly obscured by her shoulder). Penetration during prior to the swallow was  secondary to poor epilgottic inversion leading to poor airway protection. Subsequent swallows of thin liquids, even with straw sips, resulted only in flash penetration. No residuals remained in pharynx. As compared to previous MBS (about a year ago), patient's swallow is slightly improved.  SLP Visit Diagnosis Dysphagia, oropharyngeal phase (R13.12) Attention and concentration deficit following -- Frontal lobe and executive function deficit following -- Impact on safety and function Mild aspiration risk   CHL IP TREATMENT RECOMMENDATION 09/22/2019 Treatment Recommendations Therapy as outlined in treatment plan below   Prognosis 09/22/2019 Prognosis for Safe Diet Advancement Good Barriers to Reach Goals -- Barriers/Prognosis Comment -- CHL IP DIET RECOMMENDATION 09/22/2019 SLP Diet Recommendations Dysphagia 3 (Mech soft) solids;Thin liquid Liquid Administration via Cup;Straw Medication Administration Whole meds with liquid Compensations Minimize environmental distractions;Slow rate;Small sips/bites Postural Changes Seated upright at 90 degrees   CHL IP OTHER RECOMMENDATIONS 09/22/2019 Recommended Consults -- Oral Care Recommendations Oral care BID  Other Recommendations --   CHL IP FOLLOW UP RECOMMENDATIONS 09/22/2019 Follow up Recommendations None   CHL IP FREQUENCY AND DURATION 09/22/2019 Speech Therapy Frequency (ACUTE ONLY) min 1 x/week Treatment Duration 1 week      CHL IP ORAL PHASE 09/22/2019 Oral Phase Impaired Oral - Pudding Teaspoon -- Oral - Pudding Cup -- Oral - Honey Teaspoon -- Oral - Honey Cup -- Oral - Nectar Teaspoon -- Oral - Nectar Cup -- Oral - Nectar Straw -- Oral - Thin Teaspoon -- Oral - Thin Cup -- Oral - Thin Straw -- Oral - Puree -- Oral - Mech Soft -- Oral - Regular Impaired mastication;Weak lingual manipulation;Delayed oral transit Oral - Multi-Consistency -- Oral - Pill -- Oral Phase - Comment --  CHL IP PHARYNGEAL PHASE 09/22/2019 Pharyngeal Phase Impaired Pharyngeal- Pudding Teaspoon -- Pharyngeal --  Pharyngeal- Pudding Cup -- Pharyngeal -- Pharyngeal- Honey Teaspoon -- Pharyngeal -- Pharyngeal- Honey Cup -- Pharyngeal -- Pharyngeal- Nectar Teaspoon -- Pharyngeal -- Pharyngeal- Nectar Cup -- Pharyngeal -- Pharyngeal- Nectar Straw -- Pharyngeal -- Pharyngeal- Thin Teaspoon -- Pharyngeal -- Pharyngeal- Thin Cup Reduced airway/laryngeal closure;Penetration/Aspiration before swallow;Trace aspiration;Reduced epiglottic inversion Pharyngeal Material enters airway, passes BELOW cords without attempt by patient to eject out (silent aspiration);Material enters airway, remains ABOVE vocal cords and not ejected out;Material enters airway, remains ABOVE vocal cords then ejected out;Material enters airway, CONTACTS cords and then ejected out Pharyngeal- Thin Straw Penetration/Aspiration during swallow;Reduced epiglottic inversion;Reduced airway/laryngeal closure Pharyngeal Material enters airway, remains ABOVE vocal cords then ejected out Pharyngeal- Puree WFL Pharyngeal -- Pharyngeal- Mechanical Soft -- Pharyngeal -- Pharyngeal- Regular WFL Pharyngeal -- Pharyngeal- Multi-consistency -- Pharyngeal -- Pharyngeal- Pill WFL Pharyngeal -- Pharyngeal Comment --  CHL IP CERVICAL ESOPHAGEAL PHASE 09/22/2019 Cervical Esophageal Phase WFL Pudding Teaspoon -- Pudding Cup -- Honey Teaspoon -- Honey Cup -- Nectar Teaspoon -- Nectar Cup -- Nectar Straw -- Thin Teaspoon -- Thin Cup -- Thin Straw -- Puree -- Mechanical Soft -- Regular -- Multi-consistency -- Pill -- Cervical Esophageal Comment -- Dannial Monarch 09/22/2019, 5:22 PM Sonia Baller, MA, CCC-SLP Speech Therapy [facility] Acute [facility] Pager: [phone]              DG Fluoro Guide CV Line-No Report  Result Date: 09/27/2019 Fluoroscopy was utilized by the requesting physician.  No radiographic interpretation.   DG C-Arm 1-60 Min-No Report  Result Date: 09/14/2019 Fluoroscopy was utilized by the requesting physician.  No radiographic interpretation.   VAS [place] UPPER EXT VEIN  MAPPING (PRE-OP AVF)  Result Date: 09/17/2019 UPPER EXTREMITY VEIN MAPPING  Indications: Pre-access. Comparison Study: no prior Performing Technologist: Abram Sander RVS  Examination Guidelines: A complete evaluation includes B-mode imaging, spectral Doppler, color Doppler, and power Doppler as needed of all accessible portions of each vessel. Bilateral testing is considered an integral part of a complete examination. Limited examinations for reoccurring indications may be performed as noted. +-----------------+-------------+----------+--------------+ Right Cephalic   Diameter (cm)Depth (cm)   Findings    +-----------------+-------------+----------+--------------+ Shoulder             0.28        1.44                  +-----------------+-------------+----------+--------------+ Prox upper arm       0.31        1.32                  +-----------------+-------------+----------+--------------+ Mid upper arm        0.47  0.91                  +-----------------+-------------+----------+--------------+ Dist upper arm       0.32        0.42                  +-----------------+-------------+----------+--------------+ Antecubital fossa    0.44        0.32                  +-----------------+-------------+----------+--------------+ Prox forearm         0.32        0.59                  +-----------------+-------------+----------+--------------+ Mid forearm          0.35        0.52     branching    +-----------------+-------------+----------+--------------+ Dist forearm                            not visualized +-----------------+-------------+----------+--------------+ +-----------------+-------------+----------+--------+ Right Basilic    Diameter (cm)Depth (cm)Findings +-----------------+-------------+----------+--------+ Prox upper arm       0.54        1.24            +-----------------+-------------+----------+--------+ Mid upper arm        0.43        0.82             +-----------------+-------------+----------+--------+ Dist upper arm       0.38        0.48            +-----------------+-------------+----------+--------+ Antecubital fossa    0.43        0.48            +-----------------+-------------+----------+--------+ Prox forearm         0.40        0.67            +-----------------+-------------+----------+--------+ Mid forearm          0.38        0.75            +-----------------+-------------+----------+--------+ Distal forearm       0.35        0.59            +-----------------+-------------+----------+--------+ +-----------------+-------------+----------+------------------------------+ Left Cephalic    Diameter (cm)Depth (cm)           Findings            +-----------------+-------------+----------+------------------------------+ Shoulder             0.29        1.07                                  +-----------------+-------------+----------+------------------------------+ Prox upper arm       0.26        0.72                                  +-----------------+-------------+----------+------------------------------+ Mid upper arm                           not visualized due to bandages +-----------------+-------------+----------+------------------------------+ Dist upper arm  not visualized due to bandages +-----------------+-------------+----------+------------------------------+ Antecubital fossa    0.38        0.33                                  +-----------------+-------------+----------+------------------------------+ Prox forearm         0.32        0.47                                  +-----------------+-------------+----------+------------------------------+ Mid forearm                                     not visualized         +-----------------+-------------+----------+------------------------------+ Dist forearm                                     not visualized         +-----------------+-------------+----------+------------------------------+ +-----------------+-------------+----------+------------------------------+ Left Basilic     Diameter (cm)Depth (cm)           Findings            +-----------------+-------------+----------+------------------------------+ Prox upper arm       0.43        0.76                                  +-----------------+-------------+----------+------------------------------+ Mid upper arm        0.44        1.06                                  +-----------------+-------------+----------+------------------------------+ Dist upper arm                          not visualized due to bandages +-----------------+-------------+----------+------------------------------+ Antecubital fossa    0.36        1.44                                  +-----------------+-------------+----------+------------------------------+ Prox forearm         0.25        0.81                                  +-----------------+-------------+----------+------------------------------+ Mid forearm          0.27        0.55                                  +-----------------+-------------+----------+------------------------------+ Distal forearm       0.24        0.52                                  +-----------------+-------------+----------+------------------------------+ *See table(s) above for measurements and observations.  Diagnosing physician: Ruta Hinds MD Electronically signed by Juanda Crumble  Fields MD on 09/17/2019 at 5:01:41 PM.    Final       Subjective: Patient seen and examined at the dialysis suite.  Hemodynamically stable for discharge today.  Discharge Exam: Vitals:   10/01/19 1130 10/01/19 1137  BP: (!) 108/48 (!) 133/53  Pulse: (!) 107 80  Resp: 18 (!) 21  Temp:    SpO2:  96%   Vitals:   10/01/19 1030 10/01/19 1100 10/01/19 1130 10/01/19 1137  BP: (!) 113/48 (!) 116/32 (!) 108/48 (!)  133/53  Pulse: 94 (!) 107 (!) 107 80  Resp: 18 (!) 21 18 (!) 21  Temp:      TempSrc:    Oral  SpO2:    96%  Weight:    91.9 kg  Height:        General: Pt is alert, awake, not in acute distress Cardiovascular: RRR, S1/S2 +, no rubs, no gallops Respiratory: CTA bilaterally, no wheezing, no rhonchi Abdominal: Soft, NT, ND, bowel sounds + Extremities: no edema, no cyanosis    The results of significant diagnostics from this hospitalization (including imaging, microbiology, ancillary and laboratory) are listed below for reference.     Microbiology: Recent Results (from the past 240 hour(s))  Surgical PCR screen     Status: None   Collection Time: 09/26/19  7:51 PM   Specimen: Nasal Mucosa; Nasal Swab  Result Value Ref Range Status   MRSA, PCR NEGATIVE NEGATIVE Final   Staphylococcus aureus NEGATIVE NEGATIVE Final    Comment: (NOTE) The Xpert SA Assay (FDA approved for NASAL specimens in patients 2 years of age and older), is one component of a comprehensive surveillance program. It is not intended to diagnose infection nor to guide or monitor treatment. Performed at [facility], [place] [address] [place], [place] [address]   SARS CORONAVIRUS 2 (TAT 6-24 HRS) Nasopharyngeal Nasopharyngeal Swab     Status: None   Collection Time: 09/30/19 11:12 AM   Specimen: Nasopharyngeal Swab  Result Value Ref Range Status   SARS Coronavirus 2 NEGATIVE NEGATIVE Final    Comment: (NOTE) SARS-CoV-2 target nucleic acids are NOT DETECTED. The SARS-CoV-2 RNA is generally detectable in upper and lower respiratory specimens during the acute phase of infection. Negative results do not preclude SARS-CoV-2 infection, do not rule out co-infections with other pathogens, and should not be used as the sole basis for treatment or other patient management decisions. Negative results must be combined with clinical observations, patient history, and epidemiological information. The expected result  is Negative. Fact Sheet for Patients: SugarRoll.be Fact Sheet for Healthcare Providers: https://www.woods-mathews.com/ This test is not yet approved or cleared by the [place] FDA and  has been authorized for detection and/or diagnosis of SARS-CoV-2 by FDA under an Emergency Use Authorization (EUA). This EUA will remain  in effect (meaning this test can be used) for the duration of the COVID-19 declaration under Section 56 4(b)(1) of the Act, 21 U.S.C. section 360bbb-3(b)(1), unless the authorization is terminated or revoked sooner. Performed at [facility], [place] [address] [place], [place] [address]      Labs: BNP (last 3 results) Recent Labs    09/06/19 0343  BNP 123456*   Basic Metabolic Panel: Recent Labs  Lab 09/25/19 0417 09/25/19 0417 09/26/19 0548 09/27/19 0443 09/27/19 0719 09/28/19 0410 10/01/19 0824  NA 137   < > 142 141 138 140 138  K 4.2   < > 3.8 4.2 4.1 4.6 3.5  CL 101   < >  107 100 101 101 100  CO2 24  --  26 27  --  28 25  GLUCOSE 156*   < > 104* 125* 105* 175* 118*  BUN 75*   < > 33* 45* 40* 58* 55*  CREATININE 5.71*   < > 3.39* 4.58* 4.70* 5.15* 4.92*  CALCIUM 8.5*  --  8.5* 8.8*  --  8.7* 8.5*  MG  --   --  2.2  --   --   --   --   PHOS 4.2  --  3.8 5.3*  --   --  5.1*   < > = values in this interval not displayed.   Liver Function Tests: Recent Labs  Lab 09/25/19 0417 09/26/19 0548 09/27/19 0443 10/01/19 0824  ALBUMIN 2.0* 2.1* 2.1* 2.1*   No results for input(s): LIPASE, AMYLASE in the last 168 hours. No results for input(s): AMMONIA in the last 168 hours. CBC: Recent Labs  Lab 09/27/19 0719 09/28/19 0410 10/01/19 0824  WBC  --  8.6 9.5  NEUTROABS  --  6.7  --   HGB 7.8* 8.0* 8.0*  HCT 23.0* 25.3* 25.3*  MCV  --  102.8* 102.4*  PLT  --  228 192   Cardiac Enzymes: No results for input(s): CKTOTAL, CKMB, CKMBINDEX, TROPONINI in the last 168 hours. BNP: Invalid input(s):  POCBNP CBG: Recent Labs  Lab 09/30/19 0654 09/30/19 1150 09/30/19 1625 09/30/19 2046 10/01/19 0638  GLUCAP 84 213* 278* 254* 104*   D-Dimer No results for input(s): DDIMER in the last 72 hours. Hgb A1c No results for input(s): HGBA1C in the last 72 hours. Lipid Profile No results for input(s): CHOL, HDL, LDLCALC, TRIG, CHOLHDL, LDLDIRECT in the last 72 hours. Thyroid function studies No results for input(s): TSH, T4TOTAL, T3FREE, THYROIDAB in the last 72 hours.  Invalid input(s): FREET3 Anemia work up No results for input(s): VITAMINB12, FOLATE, FERRITIN, TIBC, IRON, RETICCTPCT in the last 72 hours. Urinalysis    Component Value Date/Time   COLORURINE YELLOW 09/08/2019 1231   APPEARANCEUR CLEAR 09/08/2019 1231   LABSPEC 1.010 09/08/2019 1231   PHURINE 6.0 09/08/2019 1231   GLUCOSEU 150 (A) 09/08/2019 1231   HGBUR NEGATIVE 09/08/2019 1231   HGBUR negative 10/20/2009 1425   BILIRUBINUR NEGATIVE 09/08/2019 1231   KETONESUR NEGATIVE 09/08/2019 1231   PROTEINUR >=300 (A) 09/08/2019 1231   UROBILINOGEN 0.2 10/05/2011 1002   NITRITE NEGATIVE 09/08/2019 1231   LEUKOCYTESUR SMALL (A) 09/08/2019 1231   Sepsis Labs Invalid input(s): PROCALCITONIN,  WBC,  LACTICIDVEN Microbiology Recent Results (from the past 240 hour(s))  Surgical PCR screen     Status: None   Collection Time: 09/26/19  7:51 PM   Specimen: Nasal Mucosa; Nasal Swab  Result Value Ref Range Status   MRSA, PCR NEGATIVE NEGATIVE Final   Staphylococcus aureus NEGATIVE NEGATIVE Final    Comment: (NOTE) The Xpert SA Assay (FDA approved for NASAL specimens in patients 87 years of age and older), is one component of a comprehensive surveillance program. It is not intended to diagnose infection nor to guide or monitor treatment. Performed at [facility], [place] [address], [place], [place] [address]   SARS CORONAVIRUS 2 (TAT 6-24 HRS) Nasopharyngeal Nasopharyngeal Swab     Status: None   Collection Time:  09/30/19 11:12 AM   Specimen: Nasopharyngeal Swab  Result Value Ref Range Status   SARS Coronavirus 2 NEGATIVE NEGATIVE Final    Comment: (NOTE) SARS-CoV-2 target nucleic acids are NOT DETECTED. The  SARS-CoV-2 RNA is generally detectable in upper and lower respiratory specimens during the acute phase of infection. Negative results do not preclude SARS-CoV-2 infection, do not rule out co-infections with other pathogens, and should not be used as the sole basis for treatment or other patient management decisions. Negative results must be combined with clinical observations, patient history, and epidemiological information. The expected result is Negative. Fact Sheet for Patients: SugarRoll.be Fact Sheet for Healthcare Providers: https://www.woods-mathews.com/ This test is not yet approved or cleared by the [place] FDA and  has been authorized for detection and/or diagnosis of SARS-CoV-2 by FDA under an Emergency Use Authorization (EUA). This EUA will remain  in effect (meaning this test can be used) for the duration of the COVID-19 declaration under Section 56 4(b)(1) of the Act, 21 U.S.C. section 360bbb-3(b)(1), unless the authorization is terminated or revoked sooner. Performed at [facility], [place] [address] [place], [place] [address]     Please note: You were cared for by a hospitalist during your hospital stay. Once you are discharged, your primary care physician will handle any further medical issues. Please note that NO REFILLS for any discharge medications will be authorized once you are discharged, as it is imperative that you return to your primary care physician (or establish a relationship with a primary care physician if you do not have one) for your post hospital discharge needs so that they can reassess your need for medications and monitor your lab values.    Time coordinating discharge: 40  minutes  SIGNED:   Shelly Coss, MD  [facility] 10/01/2019, 12:46 PM Pager [phone]  If 7PM-7AM, please contact night-coverage [email or web address] Password [email or web address]

## 2019-10-01 NOTE — Procedures (Signed)
I was present at this dialysis session. I have reviewed the session itself and made appropriate changes.   Vital signs in last 24 hours:  Temp:  [98 F (36.7 C)-98.4 F (36.9 C)] 98 F (36.7 C) (02/15 0730) Pulse Rate:  [66-81] 73 (02/15 0800) Resp:  [16-21] 21 (02/15 0800) BP: (127-169)/(52-67) 128/52 (02/15 0800) SpO2:  [97 %-100 %] 100 % (02/15 0730) Weight:  [89.7 kg-93.6 kg] 93.6 kg (02/15 0730) Weight change: 0.011 kg Filed Weights   09/29/19 2129 09/30/19 2045 10/01/19 0730  Weight: 89.7 kg 89.7 kg 93.6 kg    Recent Labs  Lab 09/27/19 0443 09/27/19 0719 09/28/19 0410  NA 141   < > 140  K 4.2   < > 4.6  CL 100   < > 101  CO2 27   < > 28  GLUCOSE 125*   < > 175*  BUN 45*   < > 58*  CREATININE 4.58*   < > 5.15*  CALCIUM 8.8*   < > 8.7*  PHOS 5.3*  --   --    < > = values in this interval not displayed.    Recent Labs  Lab 09/27/19 0719 09/28/19 0410  WBC  --  8.6  NEUTROABS  --  6.7  HGB 7.8* 8.0*  HCT 23.0* 25.3*  MCV  --  102.8*  PLT  --  228    Scheduled Meds: . apixaban  5 mg Oral BID  . aspirin  81 mg Oral q morning - 10a  . atorvastatin  20 mg Oral q1800  . calcitRIOL  0.25 mcg Oral Q M,W,F  . carvedilol  25 mg Oral BID WC  . Chlorhexidine Gluconate Cloth  6 each Topical Q0600  . cholecalciferol  4,000 Units Oral q morning - 10a  . [START ON 10/05/2019] darbepoetin (ARANESP) injection - NON-DIALYSIS  150 mcg Subcutaneous Q Fri-1800  . docusate sodium  100 mg Oral BID  . feeding supplement (NEPRO CARB STEADY)  237 mL Oral BID BM  . fentaNYL (SUBLIMAZE) injection  25 mcg Intravenous Once  . gabapentin  100 mg Oral TID  . hydrALAZINE  100 mg Oral Q8H  . influenza vaccine adjuvanted  0.5 mL Intramuscular Tomorrow-1000  . insulin aspart  0-9 Units Subcutaneous TID WC  . insulin glargine  12 Units Subcutaneous QHS  . levothyroxine  25 mcg Oral Q0600  . mouth rinse  15 mL Mouth Rinse BID  . montelukast  10 mg Oral QHS  . multivitamin  15 mL Oral  Daily  . mupirocin ointment  1 application Nasal BID  . neomycin-bacitracin-polymyxin  1 application Topical Daily  . pantoprazole  40 mg Oral Daily  . predniSONE  10 mg Oral Q breakfast  . primidone  50 mg Oral q morning - 10a  . pyridOXINE  200 mg Oral BID  . risperiDONE  0.5 mg Oral BID  . sodium chloride flush  3 mL Intravenous Q12H  . traZODone  50 mg Oral Daily   Continuous Infusions: . sodium chloride    . sodium chloride    . sodium chloride    . sodium chloride Stopped (09/21/19 1105)  . sodium chloride    . sodium chloride     PRN Meds:.sodium chloride, sodium chloride, sodium chloride, sodium chloride, sodium chloride, sodium chloride, acetaminophen **OR** acetaminophen, albuterol, alteplase, alum & mag hydroxide-simeth, haloperidol lactate, heparin, heparin, HYDROcodone-acetaminophen, hydrOXYzine, labetalol, lidocaine (PF), lidocaine-prilocaine, metoprolol tartrate, ondansetron **OR** ondansetron (ZOFRAN) IV, pentafluoroprop-tetrafluoroeth, polyethylene glycol,  sodium chloride flush   Assessment and Plan: 1. ESRD- set up for outpatient HD at [facility] on MWF schedule.  Will cont with this schedule while she remains an inpatient 2. Acute hypoxic respiratory failure- resolved with HD/UF 3. Anemia of CKD- on ESA 4. SHPTH- stable 5. HTN- stable 6. Disposition - for discharge to SNF today after HD.  Kathleen Potts,  MD 10/01/2019, 8:19 AM

## 2019-10-01 NOTE — Progress Notes (Signed)
Renal Navigator notified [facility] and OP HD clinic/[facility] of patient's plan for discharge to [facility] today. Medical records faxed to OP HD clinic.  Alphonzo Cruise, [place] Renal Navigator [phone]

## 2019-10-01 NOTE — Progress Notes (Signed)
PT Cancellation Note  Patient Details Name: [name] MRN: [id number] DOB: 01/08/1951   Cancelled Treatment:    Reason Eval/Treat Not Completed: Patient at procedure or test/unavailable.  In HD when PT tried earlier, and will reattempt as time and pt allow.   Ramond Dial 10/01/2019, 12:54 PM  Mee Hives, PT MS Acute Rehab Dept. Number: [place]

## 2019-10-21 ENCOUNTER — Emergency Department (HOSPITAL_COMMUNITY)
Admission: EM | Admit: 2019-10-21 | Discharge: 2019-10-21 | Disposition: A | Discharge status: Home / Self Care | Payer: Medicare Other | Attending: Emergency Medicine | Admitting: Emergency Medicine

## 2019-10-21 ENCOUNTER — Emergency Department (HOSPITAL_COMMUNITY): Payer: Medicare Other

## 2019-10-21 ENCOUNTER — Other Ambulatory Visit: Payer: Self-pay

## 2019-10-21 ENCOUNTER — Encounter (HOSPITAL_COMMUNITY): Payer: Self-pay | Admitting: Emergency Medicine

## 2019-10-21 DIAGNOSIS — R569 Unspecified convulsions: Secondary | ICD-10-CM | POA: Diagnosis present

## 2019-10-21 DIAGNOSIS — Z794 Long term (current) use of insulin: Secondary | ICD-10-CM | POA: Diagnosis not present

## 2019-10-21 DIAGNOSIS — I130 Hypertensive heart and chronic kidney disease with heart failure and stage 1 through stage 4 chronic kidney disease, or unspecified chronic kidney disease: Secondary | ICD-10-CM | POA: Diagnosis not present

## 2019-10-21 DIAGNOSIS — E1122 Type 2 diabetes mellitus with diabetic chronic kidney disease: Secondary | ICD-10-CM | POA: Diagnosis not present

## 2019-10-21 DIAGNOSIS — Z7901 Long term (current) use of anticoagulants: Secondary | ICD-10-CM | POA: Insufficient documentation

## 2019-10-21 DIAGNOSIS — N184 Chronic kidney disease, stage 4 (severe): Secondary | ICD-10-CM | POA: Insufficient documentation

## 2019-10-21 DIAGNOSIS — Z79899 Other long term (current) drug therapy: Secondary | ICD-10-CM | POA: Insufficient documentation

## 2019-10-21 DIAGNOSIS — Z87891 Personal history of nicotine dependence: Secondary | ICD-10-CM | POA: Diagnosis not present

## 2019-10-21 DIAGNOSIS — G219 Secondary parkinsonism, unspecified: Secondary | ICD-10-CM | POA: Insufficient documentation

## 2019-10-21 DIAGNOSIS — I5032 Chronic diastolic (congestive) heart failure: Secondary | ICD-10-CM | POA: Insufficient documentation

## 2019-10-21 HISTORY — DX: Unspecified convulsions: R56.9

## 2019-10-21 LAB — CBC WITH DIFFERENTIAL/PLATELET
Abs Immature Granulocytes: 0.06 10*3/uL (ref 0.00–0.07)
Basophils Absolute: 0.1 10*3/uL (ref 0.0–0.1)
Basophils Relative: 1 %
Eosinophils Absolute: 0 10*3/uL (ref 0.0–0.5)
Eosinophils Relative: 1 %
HCT: 26.2 % — ABNORMAL LOW (ref 36.0–46.0)
Hemoglobin: 8.5 g/dL — ABNORMAL LOW (ref 12.0–15.0)
Immature Granulocytes: 1 %
Lymphocytes Relative: 7 %
Lymphs Abs: 0.6 10*3/uL — ABNORMAL LOW (ref 0.7–4.0)
MCH: 32.7 pg (ref 26.0–34.0)
MCHC: 32.4 g/dL (ref 30.0–36.0)
MCV: 100.8 fL — ABNORMAL HIGH (ref 80.0–100.0)
Monocytes Absolute: 0.5 10*3/uL (ref 0.1–1.0)
Monocytes Relative: 6 %
Neutro Abs: 7.5 10*3/uL (ref 1.7–7.7)
Neutrophils Relative %: 84 %
Platelets: 224 10*3/uL (ref 150–400)
RBC: 2.6 MIL/uL — ABNORMAL LOW (ref 3.87–5.11)
RDW: 15.7 % — ABNORMAL HIGH (ref 11.5–15.5)
WBC: 8.8 10*3/uL (ref 4.0–10.5)
nRBC: 0 % (ref 0.0–0.2)

## 2019-10-21 LAB — LACTIC ACID, PLASMA
Lactic Acid, Venous: 1.4 mmol/L (ref 0.5–1.9)
Lactic Acid, Venous: 0.6 mmol/L (ref 0.5–1.9)

## 2019-10-21 LAB — BLOOD GAS, ARTERIAL
Acid-Base Excess: 5.4 mmol/L — ABNORMAL HIGH (ref 0.0–2.0)
Bicarbonate: 29 mmol/L — ABNORMAL HIGH (ref 20.0–28.0)
FIO2: 21
O2 Saturation: 94.4 %
Patient temperature: 37
pCO2 arterial: 47.7 mmHg (ref 32.0–48.0)
pH, Arterial: 7.413 (ref 7.350–7.450)
pO2, Arterial: 71.9 mmHg — ABNORMAL LOW (ref 83.0–108.0)

## 2019-10-21 LAB — MAGNESIUM: Magnesium: 2.1 mg/dL (ref 1.7–2.4)

## 2019-10-21 LAB — COMPREHENSIVE METABOLIC PANEL
ALT: 17 U/L (ref 0–44)
AST: 15 U/L (ref 15–41)
Albumin: 2.7 g/dL — ABNORMAL LOW (ref 3.5–5.0)
Alkaline Phosphatase: 66 U/L (ref 38–126)
Anion gap: 8 (ref 5–15)
BUN: 21 mg/dL (ref 8–23)
CO2: 28 mmol/L (ref 22–32)
Calcium: 8.5 mg/dL — ABNORMAL LOW (ref 8.9–10.3)
Chloride: 97 mmol/L — ABNORMAL LOW (ref 98–111)
Creatinine, Ser: 2.63 mg/dL — ABNORMAL HIGH (ref 0.44–1.00)
GFR calc Af Amer: 21 mL/min — ABNORMAL LOW (ref >60)
GFR calc non Af Amer: 18 mL/min — ABNORMAL LOW (ref >60)
Glucose, Bld: 174 mg/dL — ABNORMAL HIGH (ref 70–99)
Potassium: 3.8 mmol/L (ref 3.5–5.1)
Sodium: 133 mmol/L — ABNORMAL LOW (ref 135–145)
Total Bilirubin: 0.7 mg/dL (ref 0.3–1.2)
Total Protein: 5.3 g/dL — ABNORMAL LOW (ref 6.5–8.1)

## 2019-10-21 LAB — URINALYSIS, ROUTINE W REFLEX MICROSCOPIC
Bilirubin Urine: NEGATIVE
Glucose, UA: >=500 mg/dL — AB
Hgb urine dipstick: NEGATIVE
Ketones, ur: NEGATIVE mg/dL
Leukocytes,Ua: NEGATIVE
Nitrite: NEGATIVE
Protein, ur: >=300 mg/dL — AB
Specific Gravity, Urine: 1.006 (ref 1.005–1.030)
pH: 7 (ref 5.0–8.0)

## 2019-10-21 LAB — TSH: TSH: 3.237 u[IU]/mL (ref 0.350–4.500)

## 2019-10-21 NOTE — ED Provider Notes (Signed)
MSE was initiated and I personally evaluated the patient and placed orders (if any) at  6:27 AM on October 21, 2019.  The patient appears stable so that the remainder of the MSE may be completed by another provider.  Patient was nonverbal when she arrived to the ED  EMS states they were called for CPR in progress.  However my got there they were told the patient had been having seizures, they report 15 seizures 1 every minute for 15 minutes.  She was given lorazepam 2 mg IM.  Patient was not verbal on EMS arrival but she does appear to be awake.  Her CBG is 229, her PCO2 was 29-32.  Her blood pressure was 186/86.  Patient is awake, she is trying to speak to me but her speech is garbled.  She does not follow commands.  When I listen to the patient she has a grade 2/6 to 3/6 systolic murmur, when I look at her extremities they appear to be edematous but there is no pitting noted.  She does not appear to be in any respiratory distress.  Patient has a good bruit and thrill in her right upper extremity fistula.  Patient has dialysis access in her left chest.  Review of patient's chart shows she has a history of seizures since she was about 69 years old.  There was no description of what her seizures were like.  She was seen by [name], neurologist on October 19, 2018.  At that time he discussed that she had an MRI that was nonrevealing and a EEG x2 that was normal.  She had been started on Keppra which he felt could make her underlying psychosis worse, so he switched her to Lamictal.  Patient was admitted to the hospital on April and her Lamictal was stopped.  She had a virtual consult done with [name] on August 8 and she was doing well off the Lamictal and since he was not convinced she had a seizure disorder he did not start her on any antiepileptic medication at that time.  Patient started dialysis on September 25, 2019.  Patient was turned over to Dr. Gilford Raid at change of shift.   Rolland Porter,  MD 10/21/19 [phone]

## 2019-10-21 NOTE — ED Notes (Signed)
[facility] called to transport patient back to residence.

## 2019-10-21 NOTE — ED Notes (Signed)
Given lunch tray.

## 2019-10-21 NOTE — ED Triage Notes (Signed)
[facility] reports that they were called out to [facility] for Seizures. Patient per [facility] states that patient had numerous seizures. CBG 229. Patient was given 2 mg's of Lorazepam at facility by facility staff.

## 2019-10-21 NOTE — ED Provider Notes (Signed)
[facility] Provider Note   CSN: [id number] Arrival date & time: 10/21/19  9833     History Chief Complaint  Patient presents with  . Seizures    [name] is a 69 y.o. female.  Pt presents to the ED today with a possible seizure.  The pt is a resident of [facility] and had a seizure for approximately 15 min.  Pt has a hx of seizures, but has been taken off all seizure meds due to a question of whether or not they were true seizures vs pseudoseizures due to 2 recent nl EEGs.  Pt also started dialysis on 2/9 while hospitalized for a CHF exacerbation requiring intubation.  She was d/c with a MWF dialysis schedule.  Pt did receive 2 mg of lorazepam IM PTA.  Pt is unable to give any hx.        Past Medical History:  Diagnosis Date  . Anxiety   . Asthma   . Back pain   . Barrett esophagus   . CHF (congestive heart failure) ([place])   . CKD (chronic kidney disease) stage 3, GFR 30-59 ml/min   . COPD (chronic obstructive pulmonary disease) ([place])   . Depression   . Diastolic heart failure ([place])   . Essential hypertension   . Fibromyalgia   . GERD (gastroesophageal reflux disease)   . Gout   . History of pericarditis   . Hyperlipidemia   . Hypothyroid   . Major depressive disorder   . Memory loss   . Nephrolithiasis    2008  . Obesity hypoventilation syndrome (HCC)    CPAP  . Obstructive sleep apnea   . PE (pulmonary embolism)    2010  . Peripheral neuropathy   . PTSD (post-traumatic stress disorder)   . Rheumatoid arthritis ([place])   . Schizophrenia ([place])   . 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 Seizures ([place])   . Steroid dependence ([place])   . Type 2 diabetes mellitus ([place])   . Vitamin D deficiency     Patient Active Problem List   Diagnosis Date Noted  . Acute pulmonary edema (HCC)   . Acute respiratory failure with hypoxia and hypercapnia ([place]) 09/10/2019  . SVT (supraventricular tachycardia) ([place]) 09/10/2019  . On  mechanically assisted ventilation ([place]) 09/08/2019  . Acute metabolic encephalopathy 82/50/5397  . Volume overload 09/06/2019  . AKI (acute kidney injury) ([place]) 09/06/2019  . Altered mental state 11/28/2018  . Near syncope 11/28/2018  . Fall at home 11/28/2018  . History of seizure 11/28/2018  . 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 Pressure injury of skin 09/17/2018  . Respiratory failure ([place]) 08/16/2018  . Seizure ([place]) 08/16/2018  . Acute encephalopathy 07/27/2018  . Endotracheally intubated 07/27/2018  . Hypothyroidism 07/27/2018  . Pleuritic chest pain 07/14/2018  . Moderate persistent asthma without complication   . Type 2 diabetes mellitus ([place]) 04/19/2018  . Acute kidney injury superimposed on chronic kidney disease ([place]) 01/13/2018  . Altered mental status   . Encephalopathy 10/11/2016  . Chest pain 08/12/2016  . CKD (chronic kidney disease), stage IV ([place]) 08/12/2016  . History of pulmonary embolus (PE) 08/12/2016  . Obesity, Class III, BMI 40-49.9 (morbid obesity) ([place]) 08/12/2016  . RBBB 08/12/2016  . Positive D dimer 08/12/2016  . Cerebral infarction ([place]) 11/10/2015  . Physical deconditioning 07/01/2011  . Weakness 07/01/2011  . Diabetic foot ulcer ([place]) 05/19/2011  . Polypharmacy 02/09/2011  . BACK PAIN WITH RADICULOPATHY 05/25/2010  . Type  2 diabetes mellitus with diabetic polyneuropathy, without long-term current use of insulin ([place]) 08/18/2009  . Chronic diastolic heart failure ([place]) 04/25/2009  . Normocytic anemia 04/14/2009  . Sleep apnea 12/11/2008  . Diabetic peripheral neuropathy ([place]) 05/04/2007  . HYPERCHOLESTEROLEMIA 10/13/2006  . Gout, unspecified 10/13/2006  . HYPOKALEMIA 10/13/2006  . Schizophrenia ([place]) 10/13/2006  . Depression 10/13/2006  . Essential hypertension 10/13/2006  . Venous (peripheral) insufficiency 10/13/2006  . RHINITIS, ALLERGIC 10/13/2006  . Asthma 10/13/2006  . Reflux esophagitis 10/13/2006  . OSTEOARTHRITIS,  MULTI SITES 10/13/2006  . INCONTINENCE, URGE 10/13/2006    Past Surgical History:  Procedure Laterality Date  . APPENDECTOMY    . AV FISTULA PLACEMENT Right 09/27/2019   Procedure: ARTERIOVENOUS (AV) FISTULA CREATION;  Surgeon: Waynetta Sandy, MD;  Location: [facility];  Service: Vascular;  Laterality: Right;  . CHOLECYSTECTOMY    . COLONOSCOPY     benign polyps (12/2000), repeat colonoscopy performed in 2007  . ENDOMETRIAL BIOPSY     10/03 benign columnar mucosa (limited material)  . INSERTION OF DIALYSIS CATHETER Right 09/14/2019   Procedure: ATTEMPTED INSERTION OF DIALYSIS CATHETER;  Surgeon: Virl Cagey, MD;  Location: [facility];  Service: General;  Laterality: Right;  . INSERTION OF DIALYSIS CATHETER Right 09/27/2019   Procedure: INSERTION OF DIALYSIS CATHETER;  Surgeon: Waynetta Sandy, MD;  Location: [facility];  Service: Vascular;  Laterality: Right;  . KIDNEY SURGERY       OB History   No obstetric history on file.     Family History  Problem Relation Age of Onset  . Bone cancer Mother   . Hypertension Mother   . Heart disease Mother   . COPD Father   . Heart disease Father   . Stroke Father     Social History   Tobacco Use  . Smoking status: Former Smoker    Types: Cigarettes    Quit date: 06/23/1972    Years since quitting: 47.3  . Smokeless tobacco: Never Used  Substance Use Topics  . Alcohol use: No  . Drug use: No    Home Medications Prior to Admission medications   Medication Sig Start Date End Date Taking? Authorizing Provider  acetaminophen (TYLENOL) 325 MG tablet Take 325 mg by mouth 2 (two) times daily.     [provider]  albuterol (PROVENTIL) (2.5 MG/3ML) 0.083% nebulizer solution Take 2.5 mg by nebulization every 6 (six) hours as needed for wheezing or shortness of breath.    [provider]  albuterol (VENTOLIN HFA) 108 (90 Base) MCG/ACT inhaler Inhale 1 puff into the lungs every 6 (six) hours as needed for  wheezing or shortness of breath.    [provider]  apixaban (ELIQUIS) 5 MG TABS tablet Take 1 tablet (5 mg total) by mouth 2 (two) times daily. 10/01/19   Shelly Coss, MD  aspirin 81 MG tablet Take 81 mg by mouth every morning.     [provider]  atorvastatin (LIPITOR) 20 MG tablet Take 1 tablet by mouth at bedtime.  05/14/19   [provider]  calcitRIOL (ROCALTROL) 0.25 MCG capsule Take 1 capsule (0.25 mcg total) by mouth every Monday, Wednesday, and Friday. 10/01/19   Shelly Coss, MD  calcium carbonate (TUMS - DOSED IN MG ELEMENTAL CALCIUM) 500 MG chewable tablet Chew 1 tablet by mouth daily as needed for indigestion or heartburn.    [provider]  carvedilol (COREG) 25 MG tablet Take 1 tablet (25 mg total) by mouth 2 (  two) times daily with a meal. 10/01/19   Shelly Coss, MD  Cholecalciferol (VITAMIN D3) 50 MCG (2000 UT) TABS Take 4,000 Units by mouth every morning.     [provider]  Dextromethorphan-guaiFENesin 10-100 MG/5ML liquid Take 5 mLs by mouth daily as needed (cough).    [provider]  docusate sodium (COLACE) 100 MG capsule Take 1 capsule (100 mg total) by mouth 2 (two) times daily. Hold for diarrhea Patient taking differently: Take 100 mg by mouth daily as needed. Hold for diarrhea 11/29/18   Barton Dubois, MD  Dulaglutide (TRULICITY) 1.5 YW/7.3XT SOPN Inject 1.5 mg into the skin every Friday.     [provider]  DULoxetine (CYMBALTA) 60 MG capsule Take 1 capsule by mouth daily. 05/11/19   [provider]  EPINEPHrine (EPI-PEN) 0.3 mg/0.3 mL DEVI Inject 0.3 mg into the muscle once as needed (for bee stings, then go to the ED immediately after using).     [provider]  gabapentin (NEURONTIN) 100 MG capsule Take 1 capsule (100 mg total) by mouth 3 (three) times daily. 10/01/19   Shelly Coss, MD  hydrALAZINE (APRESOLINE) 100 MG tablet Take 1 tablet (100 mg total) by mouth every 8  (eight) hours. 10/01/19   Shelly Coss, MD  hydrOXYzine (ATARAX/VISTARIL) 25 MG tablet Take 25 mg by mouth 2 (two) times daily as needed for anxiety.     [provider]  insulin degludec (TRESIBA FLEXTOUCH) 100 UNIT/ML SOPN FlexTouch Pen Inject 18 Units into the skin at bedtime.     [provider]  levothyroxine (SYNTHROID, LEVOTHROID) 25 MCG tablet Take 1 tablet (25 mcg total) by mouth daily at 6 (six) AM. 08/19/18   Lavina Hamman, MD  linagliptin (TRADJENTA) 5 MG TABS tablet Take 5 mg by mouth every morning.     [provider]  LORazepam (ATIVAN) 1 MG tablet Take 1 tablet (1 mg total) by mouth daily as needed for anxiety. 10/01/19   Shelly Coss, MD  montelukast (SINGULAIR) 10 MG tablet Take 10 mg by mouth at bedtime.    [provider]  Multiple Vitamin (DAILY VITE PO) Take 1 tablet by mouth every morning.     [provider]  omeprazole (PRILOSEC) 20 MG capsule Take 20 mg by mouth every morning.     [provider]  predniSONE (DELTASONE) 10 MG tablet Take 10 mg by mouth at bedtime.    [provider]  primidone (MYSOLINE) 50 MG tablet Take 50 mg by mouth every morning.    [provider]  pyridOXINE (VITAMIN B-6) 100 MG tablet Take 200 mg by mouth 2 (two) times daily.    [provider]  risperiDONE (RISPERDAL) 0.5 MG tablet Take 1 tablet by mouth 2 (two) times daily. 05/11/19   [provider]  sodium phosphate (FLEET) 7-19 GM/118ML ENEM Place 1 enema rectally daily as needed for mild constipation or severe constipation.    [provider]  traMADol (ULTRAM) 50 MG tablet Take 1 tablet (50 mg total) by mouth every 6 (six) hours as needed for moderate pain. 10/01/19   Shelly Coss, MD  traZODone (DESYREL) 50 MG tablet Take 1 tablet by mouth daily. 05/11/19   [provider]  Vitamins A & D (VITAMIN A & D) ointment Apply 1 application topically See admin instructions. To be applied  after baths in the morning due to rash under skin folds    [provider]    Allergies  Benztropine mesylate, Codeine, Diazepam, Diphenhydramine hcl, Penicillins, Sulfonamide derivatives, Thioridazine hcl, and Lisinopril  Review of Systems   Review of Systems  Unable to perform ROS: Mental status change    Physical Exam Updated Vital Signs BP 139/68   Pulse 66   Temp 98.1 F (36.7 C) (Oral)   Resp (!) 22   Ht 5\' 5"  (1.651 m)   Wt 90.7 kg   SpO2 98%   BMI 33.28 kg/m   Physical Exam Vitals and nursing note reviewed.  Constitutional:      General: She is awake.     Appearance: She is obese.  HENT:     Head: Normocephalic and atraumatic.     Right Ear: External ear normal.     Left Ear: External ear normal.     Nose: Nose normal.     Mouth/Throat:     Mouth: Mucous membranes are moist.     Pharynx: Oropharynx is clear.  Eyes:     Extraocular Movements: Extraocular movements intact.     Conjunctiva/sclera: Conjunctivae normal.     Pupils: Pupils are equal, round, and reactive to light.  Cardiovascular:     Rate and Rhythm: Normal rate. Rhythm irregular.     Pulses: Normal pulses.     Heart sounds: Normal heart sounds.  Pulmonary:     Effort: Pulmonary effort is normal.     Breath sounds: Normal breath sounds.  Abdominal:     General: Abdomen is flat. Bowel sounds are normal.     Palpations: Abdomen is soft.  Musculoskeletal:        General: Normal range of motion.     Cervical back: Normal range of motion and neck supple.     Comments: + AVF right AC with good thrill Dialysis catheter left chest  Skin:    General: Skin is warm.     Capillary Refill: Capillary refill takes less than 2 seconds.  Neurological:     Comments: Pt awake, but not following commands or speaking  Psychiatric:     Comments: Unable to assess     ED Results / Procedures / Treatments   Labs (all labs ordered are listed, but only abnormal results are displayed) Labs  Reviewed  COMPREHENSIVE METABOLIC PANEL - Abnormal; Notable for the following components:      Result Value   Sodium 133 (*)    Chloride 97 (*)    Glucose, Bld 174 (*)    Creatinine, Ser 2.63 (*)    Calcium 8.5 (*)    Total Protein 5.3 (*)    Albumin 2.7 (*)    GFR calc non Af Amer 18 (*)    GFR calc Af Amer 21 (*)    All other components within normal limits  CBC WITH DIFFERENTIAL/PLATELET - Abnormal; Notable for the following components:   RBC 2.60 (*)    Hemoglobin 8.5 (*)    HCT 26.2 (*)    MCV 100.8 (*)    RDW 15.7 (*)    Lymphs Abs 0.6 (*)    All other components within normal limits  URINALYSIS, ROUTINE W REFLEX MICROSCOPIC - Abnormal; Notable for the following components:   Glucose, UA >=500 (*)    Protein, ur >=300 (*)    Bacteria, UA FEW (*)    All other components within normal limits  BLOOD GAS, ARTERIAL - Abnormal; Notable for the following components:   pO2, Arterial 71.9 (*)    Bicarbonate 29.0 (*)    Acid-Base Excess 5.4 (*)  All other components within normal limits  LACTIC ACID, PLASMA  LACTIC ACID, PLASMA  MAGNESIUM  TSH    EKG EKG Interpretation  Date/Time:  Sunday October 21 2019 06:51:16 EST Ventricular Rate:  76 PR Interval:    QRS Duration: 177 QT Interval:  439 QTC Calculation: 494 R Axis:   -46 Text Interpretation: Sinus rhythm Multiform ventricular premature complexes RBBB and LAFB Left ventricular hypertrophy No significant change since last tracing Confirmed by Isla Pence [phone]) on 10/21/2019 8:11:54 AM   Radiology CT Head Wo Contrast  Result Date: 10/21/2019 CLINICAL DATA:  69 year old nursing home patient presenting with acute onset of seizures. Hyperglycemia. Acute mental status changes. EXAM: CT HEAD WITHOUT CONTRAST TECHNIQUE: Contiguous axial images were obtained from the base of the skull through the vertex without intravenous contrast. COMPARISON:  09/11/2019 and earlier, including MRI brain 09/27/2018 and earlier. FINDINGS:  Brain: Mild age related cortical and deep atrophy, unchanged. Mild-to-moderate changes of small vessel disease of the white matter diffusely, unchanged. No mass lesion. No midline shift. No acute hemorrhage or hematoma. No extra-axial fluid collections. No evidence of acute infarction. Vascular: Moderate BILATERAL carotid siphon atherosclerosis. No hyperdense vessel. Skull: Mild changes of hyperostosis frontalis interna. No skull fracture or other focal osseous abnormality involving the skull. Sinuses/Orbits: Visualized paranasal sinuses, bilateral mastoid air cells and bilateral middle ear cavities well-aerated. Frontal sinuses are hypoplastic. Visualized orbits and globes normal in appearance. Other: None. IMPRESSION: 1. No acute intracranial abnormality. 2. Stable mild age related cortical and deep atrophy and mild-to-moderate chronic microvascular ischemic changes of the white matter. Electronically Signed   By: Evangeline Dakin M.D.   On: 10/21/2019 08:17   DG Chest Portable 1 View  Result Date: 10/21/2019 CLINICAL DATA:  Altered mental status EXAM: PORTABLE CHEST 1 VIEW COMPARISON:  09/27/2019 FINDINGS: Dialysis catheter is unchanged. Patient is significantly rotated. Low lung volumes with elevation of the right hemidiaphragm. Pulmonary vascular congestion. No pleural effusion or pneumothorax. Stable cardiomegaly. IMPRESSION: Pulmonary vascular congestion. Electronically Signed   By: Macy Mis M.D.   On: 10/21/2019 08:10    Procedures Procedures (including critical care time)  Medications Ordered in ED Medications - No data to display  ED Course  I have reviewed the triage vital signs and the nursing notes.  Pertinent labs & imaging results that were available during my care of the patient were reviewed by me and considered in my medical decision making (see chart for details).    MDM Rules/Calculators/A&P                      Pt's lactic acid is nl.  It is very hard to imagine a 15  minute seizure with a nl lactic.  Labs reveal nothing that is new.  She has had multiple episodes of "catatonia" in the past.  I see multiple admissions for AMS.  Pt is now looking at this writer when I come in the room, but she is still not talking.  I will continue to observe.  Pt has been watched for several hrs.  She is back to nl.  She is eating and drinking and stable for d/c.   Final Clinical Impression(s) / ED Diagnoses Final diagnoses:  Seizure-like activity [facility])    Rx / [facility] Orders ED Discharge Orders    None       Isla Pence, MD 10/21/19 1443

## 2019-11-01 ENCOUNTER — Other Ambulatory Visit: Payer: Self-pay | Admitting: *Deleted

## 2019-11-01 DIAGNOSIS — N184 Chronic kidney disease, stage 4 (severe): Secondary | ICD-10-CM

## 2019-11-02 ENCOUNTER — Ambulatory Visit (HOSPITAL_COMMUNITY)
Admission: RE | Admit: 2019-11-02 | Discharge: 2019-11-02 | Disposition: A | Discharge status: Home / Self Care | Payer: Medicare Other | Source: Ambulatory Visit | Attending: Vascular Surgery | Admitting: Vascular Surgery

## 2019-11-02 ENCOUNTER — Ambulatory Visit (INDEPENDENT_AMBULATORY_CARE_PROVIDER_SITE_OTHER): Payer: Self-pay | Admitting: Vascular Surgery

## 2019-11-02 ENCOUNTER — Other Ambulatory Visit: Payer: Self-pay

## 2019-11-02 ENCOUNTER — Encounter: Payer: Self-pay | Admitting: Vascular Surgery

## 2019-11-02 VITALS — BP 139/64 | HR 69 | Temp 97.3°F | Resp 18 | Ht 65.0 in | Wt 200.0 lb

## 2019-11-02 DIAGNOSIS — N186 End stage renal disease: Secondary | ICD-10-CM

## 2019-11-02 DIAGNOSIS — N184 Chronic kidney disease, stage 4 (severe): Secondary | ICD-10-CM

## 2019-11-02 NOTE — Progress Notes (Signed)
    Subjective:     Patient ID: [name], female   DOB: October 06, 1950, 69 y.o.   MRN: [id number]  HPI 69 year old female follows up after right arm brachiocephalic AV fistula creation with TDC placement.  She states catheter is working well.  Fistula has not been used.  She remains on blood thinners.  She denies any right upper extremity issues.  She currently resides in [place] she is very pleased with her care there.   Review of Systems No complaints today    Objective:   Physical Exam  Vitals:   11/02/19 1148  BP: 139/64  Pulse: 69  Resp: 18  Temp: (!) 97.3 F (36.3 C)  SpO2: 100%   Awake alert oriented Nonlabored respirations Right upper arm fistula with strong thrill Palpable bilateral radial pulses     Data: I have independently interpreted her dialysis access duplex.  Flow volume 1006 mL/min.  Diameter up to 0.68 cm in the upper arm.  Depth is high as 1.54 cm in the proximal upper arm only 0.5 cm in the mid arm.     Assessment/plan     69 year old female with end-stage renal disease follows up for evaluation of her AV fistula which appears to be maturing well.  This should be suitable for use around May 1.  After it has been cannulated for a full week TDC can be removed.  She can follow-up in our office as needed.    Liyat Faulkenberry C. Donzetta Matters, MD [facility] Office: [phone] Pager: [phone]

## 2019-11-02 NOTE — Progress Notes (Deleted)
Patient ID: [name], female   DOB: July 29, 1951, 69 y.o.   MRN: [id number]  Reason for Consult: No chief complaint on file.   Referred by Glenda Chroman, MD  Subjective:     HPI:  [name] is a 69 y.o. female ***  Past Medical History:  Diagnosis Date  . Anxiety   . Asthma   . Back pain   . Barrett esophagus   . CHF (congestive heart failure) ([place])   . CKD (chronic kidney disease) stage 3, GFR 30-59 ml/min   . COPD (chronic obstructive pulmonary disease) ([place])   . Depression   . Diastolic heart failure ([place])   . Essential hypertension   . Fibromyalgia   . GERD (gastroesophageal reflux disease)   . Gout   . History of pericarditis   . Hyperlipidemia   . Hypothyroid   . Major depressive disorder   . Memory loss   . Nephrolithiasis    2008  . Obesity hypoventilation syndrome (HCC)    CPAP  . Obstructive sleep apnea   . PE (pulmonary embolism)    2010  . Peripheral neuropathy   . PTSD (post-traumatic stress disorder)   . Rheumatoid arthritis ([place])   . Schizophrenia ([place])   . 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 Seizures ([place])   . Steroid dependence ([place])   . Type 2 diabetes mellitus ([place])   . Vitamin D deficiency    Family History  Problem Relation Age of Onset  . Bone cancer Mother   . Hypertension Mother   . Heart disease Mother   . COPD Father   . Heart disease Father   . Stroke Father    Past Surgical History:  Procedure Laterality Date  . APPENDECTOMY    . AV FISTULA PLACEMENT Right 09/27/2019   Procedure: ARTERIOVENOUS (AV) FISTULA CREATION;  Surgeon: Waynetta Sandy, MD;  Location: [facility];  Service: Vascular;  Laterality: Right;  . CHOLECYSTECTOMY    . COLONOSCOPY     benign polyps (12/2000), repeat colonoscopy performed in 2007  . ENDOMETRIAL BIOPSY     10/03 benign columnar mucosa (limited material)  . INSERTION OF DIALYSIS CATHETER Right 09/14/2019   Procedure: ATTEMPTED INSERTION OF DIALYSIS CATHETER;  Surgeon:  Virl Cagey, MD;  Location: [facility];  Service: General;  Laterality: Right;  . INSERTION OF DIALYSIS CATHETER Right 09/27/2019   Procedure: INSERTION OF DIALYSIS CATHETER;  Surgeon: Waynetta Sandy, MD;  Location: [facility];  Service: Vascular;  Laterality: Right;  . KIDNEY SURGERY      Short Social History:  Social History   Tobacco Use  . Smoking status: Former Smoker    Types: Cigarettes    Quit date: 06/23/1972    Years since quitting: 47.3  . Smokeless tobacco: Never Used  Substance Use Topics  . Alcohol use: No    Allergies  Allergen Reactions  . Benztropine Mesylate Other (See Comments)    Alopecia and white spots on eyes  . Codeine Swelling, Palpitations and Other (See Comments)    Mouth Swelling and Tachycardia   . Diazepam Other (See Comments)    COMA  . Diphenhydramine Hcl Anxiety, Palpitations and Other (See Comments)    Tachycardia and anxiousness  . Penicillins Swelling and Other (See Comments)    REACTION: ulcers in mouth, swelling, throat swelling  Has patient had a PCN reaction causing immediate rash, facial/tongue/throat swelling, SOB or lightheadedness with hypotension: YES Has patient had a PCN reaction causing severe rash  involving Mucus membranes or skin necrosis: Unknown Has patient had a PCN reaction that required hospitalization Unknown Has patient had a PCN reaction occurring within the last 10 years: unknown If all of the above answers are "NO", then may proceed with Cephalospori  . Sulfonamide Derivatives Rash    Blisters, also  . Thioridazine Hcl Swelling  . Lisinopril Cough    Current Outpatient Medications  Medication Sig Dispense Refill  . acetaminophen (TYLENOL) 325 MG tablet Take 325 mg by mouth 2 (two) times daily.     [name] Kitchen albuterol (PROVENTIL) (2.5 MG/3ML) 0.083% nebulizer solution Take 2.5 mg by nebulization every 6 (six) hours as needed for wheezing or shortness of breath.    [name] Kitchen albuterol (VENTOLIN HFA) 108 (90 Base) MCG/ACT  inhaler Inhale 1 puff into the lungs every 6 (six) hours as needed for wheezing or shortness of breath.    [name] Kitchen apixaban (ELIQUIS) 5 MG TABS tablet Take 1 tablet (5 mg total) by mouth 2 (two) times daily. 60 tablet   . aspirin 81 MG tablet Take 81 mg by mouth every morning.     [name] Kitchen atorvastatin (LIPITOR) 20 MG tablet Take 1 tablet by mouth at bedtime.     . calcitRIOL (ROCALTROL) 0.25 MCG capsule Take 1 capsule (0.25 mcg total) by mouth every Monday, Wednesday, and Friday.    . calcium carbonate (TUMS - DOSED IN MG ELEMENTAL CALCIUM) 500 MG chewable tablet Chew 1 tablet by mouth daily as needed for indigestion or heartburn.    . carvedilol (COREG) 25 MG tablet Take 1 tablet (25 mg total) by mouth 2 (two) times daily with a meal.    . Cholecalciferol (VITAMIN D3) 50 MCG (2000 UT) TABS Take 4,000 Units by mouth every morning.     [name] Kitchen Dextromethorphan-guaiFENesin 10-100 MG/5ML liquid Take 5 mLs by mouth daily as needed (cough).    . docusate sodium (COLACE) 100 MG capsule Take 1 capsule (100 mg total) by mouth 2 (two) times daily. Hold for diarrhea (Patient taking differently: Take 100 mg by mouth daily as needed. Hold for diarrhea)    . Dulaglutide (TRULICITY) 1.5 LK/4.4WN SOPN Inject 1.5 mg into the skin every Friday.     . DULoxetine (CYMBALTA) 60 MG capsule Take 1 capsule by mouth daily.    [name] Kitchen EPINEPHrine (EPI-PEN) 0.3 mg/0.3 mL DEVI Inject 0.3 mg into the muscle once as needed (for bee stings, then go to the ED immediately after using).     . gabapentin (NEURONTIN) 100 MG capsule Take 1 capsule (100 mg total) by mouth 3 (three) times daily.    . hydrALAZINE (APRESOLINE) 100 MG tablet Take 1 tablet (100 mg total) by mouth every 8 (eight) hours.    . hydrOXYzine (ATARAX/VISTARIL) 25 MG tablet Take 25 mg by mouth 2 (two) times daily as needed for anxiety.     . insulin degludec (TRESIBA FLEXTOUCH) 100 UNIT/ML SOPN FlexTouch Pen Inject 18 Units into the skin at bedtime.     [name] Kitchen levothyroxine (SYNTHROID,  LEVOTHROID) 25 MCG tablet Take 1 tablet (25 mcg total) by mouth daily at 6 (six) AM. 30 tablet 0  . linagliptin (TRADJENTA) 5 MG TABS tablet Take 5 mg by mouth every morning.     [name] Kitchen LORazepam (ATIVAN) 1 MG tablet Take 1 tablet (1 mg total) by mouth daily as needed for anxiety. 10 tablet 0  . montelukast (SINGULAIR) 10 MG tablet Take 10 mg by mouth at bedtime.    . Multiple Vitamin (DAILY VITE PO) Take 1  tablet by mouth every morning.     [name] Kitchen omeprazole (PRILOSEC) 20 MG capsule Take 20 mg by mouth every morning.     . predniSONE (DELTASONE) 10 MG tablet Take 10 mg by mouth at bedtime.    . primidone (MYSOLINE) 50 MG tablet Take 50 mg by mouth every morning.    . pyridOXINE (VITAMIN B-6) 100 MG tablet Take 200 mg by mouth 2 (two) times daily.    . risperiDONE (RISPERDAL) 0.5 MG tablet Take 1 tablet by mouth 2 (two) times daily.    . sodium phosphate (FLEET) 7-19 GM/118ML ENEM Place 1 enema rectally daily as needed for mild constipation or severe constipation.    . traMADol (ULTRAM) 50 MG tablet Take 1 tablet (50 mg total) by mouth every 6 (six) hours as needed for moderate pain. 10 tablet 0  . traZODone (DESYREL) 50 MG tablet Take 1 tablet by mouth daily.    . Vitamins A & D (VITAMIN A & D) ointment Apply 1 application topically See admin instructions. To be applied after baths in the morning due to rash under skin folds     No current facility-administered medications for this visit.    REVIEW OF SYSTEMS      Objective:  Objective   There were no vitals filed for this visit. There is no height or weight on file to calculate BMI.  Physical Exam  Data: ***I have independently interpreted her dialysis access duplex.  Flow volume 1006 mL/min.  Diameter up to 0.68 cm in the upper arm.  Depth is high as 1.54 cm in the proximal upper arm only 0.5 cm in the mid arm.     Assessment/Plan:     ***     Waynetta Sandy MD [facility]

## 2019-11-22 ENCOUNTER — Inpatient Hospital Stay (HOSPITAL_COMMUNITY)
Admission: EM | Admit: 2019-11-22 | Discharge: 2019-11-27 | DRG: 308 | Disposition: A | Discharge status: Skilled Nursing Facility | Payer: Medicare Other | Source: Other Acute Inpatient Hospital | Attending: Internal Medicine | Admitting: Internal Medicine

## 2019-11-22 ENCOUNTER — Emergency Department (HOSPITAL_COMMUNITY): Payer: Medicare Other

## 2019-11-22 ENCOUNTER — Other Ambulatory Visit: Payer: Self-pay

## 2019-11-22 ENCOUNTER — Encounter (HOSPITAL_COMMUNITY): Payer: Self-pay

## 2019-11-22 DIAGNOSIS — I4892 Unspecified atrial flutter: Secondary | ICD-10-CM | POA: Diagnosis not present

## 2019-11-22 DIAGNOSIS — I4891 Unspecified atrial fibrillation: Secondary | ICD-10-CM | POA: Diagnosis present

## 2019-11-22 DIAGNOSIS — G40909 Epilepsy, unspecified, not intractable, without status epilepticus: Secondary | ICD-10-CM | POA: Diagnosis present

## 2019-11-22 DIAGNOSIS — J449 Chronic obstructive pulmonary disease, unspecified: Secondary | ICD-10-CM | POA: Diagnosis present

## 2019-11-22 DIAGNOSIS — I10 Essential (primary) hypertension: Secondary | ICD-10-CM | POA: Diagnosis present

## 2019-11-22 DIAGNOSIS — F329 Major depressive disorder, single episode, unspecified: Secondary | ICD-10-CM | POA: Diagnosis present

## 2019-11-22 DIAGNOSIS — I132 Hypertensive heart and chronic kidney disease with heart failure and with stage 5 chronic kidney disease, or end stage renal disease: Secondary | ICD-10-CM | POA: Diagnosis present

## 2019-11-22 DIAGNOSIS — I484 Atypical atrial flutter: Principal | ICD-10-CM | POA: Diagnosis present

## 2019-11-22 DIAGNOSIS — Z825 Family history of asthma and other chronic lower respiratory diseases: Secondary | ICD-10-CM

## 2019-11-22 DIAGNOSIS — I350 Nonrheumatic aortic (valve) stenosis: Secondary | ICD-10-CM | POA: Diagnosis not present

## 2019-11-22 DIAGNOSIS — F209 Schizophrenia, unspecified: Secondary | ICD-10-CM | POA: Diagnosis present

## 2019-11-22 DIAGNOSIS — E039 Hypothyroidism, unspecified: Secondary | ICD-10-CM | POA: Diagnosis present

## 2019-11-22 DIAGNOSIS — Z888 Allergy status to other drugs, medicaments and biological substances status: Secondary | ICD-10-CM

## 2019-11-22 DIAGNOSIS — G629 Polyneuropathy, unspecified: Secondary | ICD-10-CM | POA: Diagnosis present

## 2019-11-22 DIAGNOSIS — I4510 Unspecified right bundle-branch block: Secondary | ICD-10-CM | POA: Diagnosis present

## 2019-11-22 DIAGNOSIS — L03115 Cellulitis of right lower limb: Secondary | ICD-10-CM | POA: Diagnosis present

## 2019-11-22 DIAGNOSIS — N186 End stage renal disease: Secondary | ICD-10-CM | POA: Diagnosis not present

## 2019-11-22 DIAGNOSIS — I5032 Chronic diastolic (congestive) heart failure: Secondary | ICD-10-CM | POA: Diagnosis not present

## 2019-11-22 DIAGNOSIS — E1122 Type 2 diabetes mellitus with diabetic chronic kidney disease: Secondary | ICD-10-CM | POA: Diagnosis present

## 2019-11-22 DIAGNOSIS — E1142 Type 2 diabetes mellitus with diabetic polyneuropathy: Secondary | ICD-10-CM | POA: Diagnosis present

## 2019-11-22 DIAGNOSIS — M797 Fibromyalgia: Secondary | ICD-10-CM | POA: Diagnosis present

## 2019-11-22 DIAGNOSIS — Z794 Long term (current) use of insulin: Secondary | ICD-10-CM

## 2019-11-22 DIAGNOSIS — L02415 Cutaneous abscess of right lower limb: Secondary | ICD-10-CM

## 2019-11-22 DIAGNOSIS — R778 Other specified abnormalities of plasma proteins: Secondary | ICD-10-CM | POA: Diagnosis present

## 2019-11-22 DIAGNOSIS — L03116 Cellulitis of left lower limb: Secondary | ICD-10-CM | POA: Diagnosis present

## 2019-11-22 DIAGNOSIS — Z7901 Long term (current) use of anticoagulants: Secondary | ICD-10-CM | POA: Diagnosis not present

## 2019-11-22 DIAGNOSIS — Z8249 Family history of ischemic heart disease and other diseases of the circulatory system: Secondary | ICD-10-CM

## 2019-11-22 DIAGNOSIS — B952 Enterococcus as the cause of diseases classified elsewhere: Secondary | ICD-10-CM | POA: Diagnosis present

## 2019-11-22 DIAGNOSIS — Z823 Family history of stroke: Secondary | ICD-10-CM

## 2019-11-22 DIAGNOSIS — F259 Schizoaffective disorder, unspecified: Secondary | ICD-10-CM | POA: Diagnosis present

## 2019-11-22 DIAGNOSIS — Z1621 Resistance to vancomycin: Secondary | ICD-10-CM | POA: Diagnosis present

## 2019-11-22 DIAGNOSIS — K219 Gastro-esophageal reflux disease without esophagitis: Secondary | ICD-10-CM | POA: Diagnosis present

## 2019-11-22 DIAGNOSIS — E785 Hyperlipidemia, unspecified: Secondary | ICD-10-CM | POA: Diagnosis present

## 2019-11-22 DIAGNOSIS — F32A Depression, unspecified: Secondary | ICD-10-CM | POA: Diagnosis present

## 2019-11-22 DIAGNOSIS — M069 Rheumatoid arthritis, unspecified: Secondary | ICD-10-CM | POA: Diagnosis present

## 2019-11-22 DIAGNOSIS — E669 Obesity, unspecified: Secondary | ICD-10-CM | POA: Diagnosis present

## 2019-11-22 DIAGNOSIS — Z20822 Contact with and (suspected) exposure to covid-19: Secondary | ICD-10-CM | POA: Diagnosis present

## 2019-11-22 DIAGNOSIS — F419 Anxiety disorder, unspecified: Secondary | ICD-10-CM | POA: Diagnosis present

## 2019-11-22 DIAGNOSIS — R000 Tachycardia, unspecified: Secondary | ICD-10-CM

## 2019-11-22 DIAGNOSIS — Z7982 Long term (current) use of aspirin: Secondary | ICD-10-CM

## 2019-11-22 DIAGNOSIS — Z7952 Long term (current) use of systemic steroids: Secondary | ICD-10-CM

## 2019-11-22 DIAGNOSIS — Z885 Allergy status to narcotic agent status: Secondary | ICD-10-CM

## 2019-11-22 DIAGNOSIS — Z882 Allergy status to sulfonamides status: Secondary | ICD-10-CM

## 2019-11-22 DIAGNOSIS — I4819 Other persistent atrial fibrillation: Secondary | ICD-10-CM | POA: Diagnosis not present

## 2019-11-22 DIAGNOSIS — Z86711 Personal history of pulmonary embolism: Secondary | ICD-10-CM

## 2019-11-22 DIAGNOSIS — Z880 Allergy status to penicillin: Secondary | ICD-10-CM

## 2019-11-22 DIAGNOSIS — Z992 Dependence on renal dialysis: Secondary | ICD-10-CM | POA: Diagnosis not present

## 2019-11-22 DIAGNOSIS — Z79899 Other long term (current) drug therapy: Secondary | ICD-10-CM

## 2019-11-22 DIAGNOSIS — E119 Type 2 diabetes mellitus without complications: Secondary | ICD-10-CM

## 2019-11-22 DIAGNOSIS — N390 Urinary tract infection, site not specified: Secondary | ICD-10-CM | POA: Diagnosis present

## 2019-11-22 DIAGNOSIS — Z87891 Personal history of nicotine dependence: Secondary | ICD-10-CM | POA: Diagnosis not present

## 2019-11-22 DIAGNOSIS — I361 Nonrheumatic tricuspid (valve) insufficiency: Secondary | ICD-10-CM | POA: Diagnosis not present

## 2019-11-22 DIAGNOSIS — Z6832 Body mass index (BMI) 32.0-32.9, adult: Secondary | ICD-10-CM

## 2019-11-22 DIAGNOSIS — E8889 Other specified metabolic disorders: Secondary | ICD-10-CM | POA: Diagnosis present

## 2019-11-22 DIAGNOSIS — I5033 Acute on chronic diastolic (congestive) heart failure: Secondary | ICD-10-CM | POA: Diagnosis present

## 2019-11-22 DIAGNOSIS — D631 Anemia in chronic kidney disease: Secondary | ICD-10-CM | POA: Diagnosis present

## 2019-11-22 DIAGNOSIS — I480 Paroxysmal atrial fibrillation: Secondary | ICD-10-CM | POA: Diagnosis present

## 2019-11-22 DIAGNOSIS — E559 Vitamin D deficiency, unspecified: Secondary | ICD-10-CM | POA: Diagnosis present

## 2019-11-22 DIAGNOSIS — I272 Pulmonary hypertension, unspecified: Secondary | ICD-10-CM | POA: Diagnosis present

## 2019-11-22 DIAGNOSIS — I071 Rheumatic tricuspid insufficiency: Secondary | ICD-10-CM | POA: Diagnosis present

## 2019-11-22 LAB — CBC WITH DIFFERENTIAL/PLATELET
Abs Immature Granulocytes: 0.04 10*3/uL (ref 0.00–0.07)
Basophils Absolute: 0 10*3/uL (ref 0.0–0.1)
Basophils Relative: 0 %
Eosinophils Absolute: 0 10*3/uL (ref 0.0–0.5)
Eosinophils Relative: 0 %
HCT: 32.1 % — ABNORMAL LOW (ref 36.0–46.0)
Hemoglobin: 10.3 g/dL — ABNORMAL LOW (ref 12.0–15.0)
Immature Granulocytes: 1 %
Lymphocytes Relative: 5 %
Lymphs Abs: 0.4 10*3/uL — ABNORMAL LOW (ref 0.7–4.0)
MCH: 33.3 pg (ref 26.0–34.0)
MCHC: 32.1 g/dL (ref 30.0–36.0)
MCV: 103.9 fL — ABNORMAL HIGH (ref 80.0–100.0)
Monocytes Absolute: 0.4 10*3/uL (ref 0.1–1.0)
Monocytes Relative: 5 %
Neutro Abs: 7 10*3/uL (ref 1.7–7.7)
Neutrophils Relative %: 89 %
Platelets: 231 10*3/uL (ref 150–400)
RBC: 3.09 MIL/uL — ABNORMAL LOW (ref 3.87–5.11)
RDW: 16.6 % — ABNORMAL HIGH (ref 11.5–15.5)
WBC: 7.9 10*3/uL (ref 4.0–10.5)
nRBC: 0 % (ref 0.0–0.2)

## 2019-11-22 LAB — COMPREHENSIVE METABOLIC PANEL
ALT: 25 U/L (ref 0–44)
AST: 32 U/L (ref 15–41)
Albumin: 3.1 g/dL — ABNORMAL LOW (ref 3.5–5.0)
Alkaline Phosphatase: 59 U/L (ref 38–126)
Anion gap: 10 (ref 5–15)
BUN: 36 mg/dL — ABNORMAL HIGH (ref 8–23)
CO2: 26 mmol/L (ref 22–32)
Calcium: 8.2 mg/dL — ABNORMAL LOW (ref 8.9–10.3)
Chloride: 99 mmol/L (ref 98–111)
Creatinine, Ser: 2.86 mg/dL — ABNORMAL HIGH (ref 0.44–1.00)
GFR calc Af Amer: 19 mL/min — ABNORMAL LOW (ref >60)
GFR calc non Af Amer: 16 mL/min — ABNORMAL LOW (ref >60)
Glucose, Bld: 211 mg/dL — ABNORMAL HIGH (ref 70–99)
Potassium: 3.7 mmol/L (ref 3.5–5.1)
Sodium: 135 mmol/L (ref 135–145)
Total Bilirubin: 0.7 mg/dL (ref 0.3–1.2)
Total Protein: 5.9 g/dL — ABNORMAL LOW (ref 6.5–8.1)

## 2019-11-22 LAB — URINALYSIS, ROUTINE W REFLEX MICROSCOPIC
Bilirubin Urine: NEGATIVE
Glucose, UA: 150 mg/dL — AB
Hgb urine dipstick: NEGATIVE
Ketones, ur: NEGATIVE mg/dL
Nitrite: NEGATIVE
Protein, ur: >=300 mg/dL — AB
Specific Gravity, Urine: 1.009 (ref 1.005–1.030)
pH: 7 (ref 5.0–8.0)

## 2019-11-22 LAB — TROPONIN I (HIGH SENSITIVITY)
Troponin I (High Sensitivity): 40 ng/L — ABNORMAL HIGH (ref <18)
Troponin I (High Sensitivity): 81 ng/L — ABNORMAL HIGH (ref <18)
Troponin I (High Sensitivity): 181 ng/L (ref <18)
Troponin I (High Sensitivity): 380 ng/L (ref <18)

## 2019-11-22 LAB — MAGNESIUM: Magnesium: 1.8 mg/dL (ref 1.7–2.4)

## 2019-11-22 LAB — LACTIC ACID, PLASMA: Lactic Acid, Venous: 1.4 mmol/L (ref 0.5–1.9)

## 2019-11-22 LAB — RESPIRATORY PANEL BY RT PCR (FLU A&B, COVID)
Influenza A by PCR: NEGATIVE
Influenza B by PCR: NEGATIVE
SARS Coronavirus 2 by RT PCR: NEGATIVE

## 2019-11-22 LAB — BRAIN NATRIURETIC PEPTIDE: B Natriuretic Peptide: 1131 pg/mL — ABNORMAL HIGH (ref 0.0–100.0)

## 2019-11-22 LAB — TSH: TSH: 2.274 u[IU]/mL (ref 0.350–4.500)

## 2019-11-22 LAB — GLUCOSE, CAPILLARY: Glucose-Capillary: 87 mg/dL (ref 70–99)

## 2019-11-22 MED ORDER — CARVEDILOL 12.5 MG PO TABS
25.0000 mg | ORAL_TABLET | Freq: Two times a day (BID) | ORAL | Status: AC
  Administered 2019-11-22 – 2019-11-23 (×3): 25 mg via ORAL
  Filled 2019-11-22 (×3): qty 2

## 2019-11-22 MED ORDER — MONTELUKAST SODIUM 10 MG PO TABS
10.0000 mg | ORAL_TABLET | Freq: Every day | ORAL | Status: DC
  Administered 2019-11-22 – 2019-11-26 (×5): 10 mg via ORAL
  Filled 2019-11-22 (×5): qty 1

## 2019-11-22 MED ORDER — DILTIAZEM HCL-DEXTROSE 125-5 MG/125ML-% IV SOLN (PREMIX)
5.0000 mg/h | INTRAVENOUS | Status: DC
  Administered 2019-11-22: 5 mg/h via INTRAVENOUS
  Filled 2019-11-22: qty 125

## 2019-11-22 MED ORDER — ONDANSETRON HCL 4 MG PO TABS: 4.0000 mg | ORAL_TABLET | Freq: Four times a day (QID) | ORAL | Status: DC | PRN

## 2019-11-22 MED ORDER — ATORVASTATIN CALCIUM 20 MG PO TABS
20.0000 mg | ORAL_TABLET | Freq: Every day | ORAL | Status: DC
  Administered 2019-11-22 – 2019-11-24 (×3): 20 mg via ORAL
  Filled 2019-11-22 (×3): qty 1

## 2019-11-22 MED ORDER — PREDNISONE 10 MG PO TABS
10.0000 mg | ORAL_TABLET | Freq: Every day | ORAL | Status: DC
  Administered 2019-11-22 – 2019-11-26 (×5): 10 mg via ORAL
  Filled 2019-11-22 (×5): qty 1

## 2019-11-22 MED ORDER — INSULIN ASPART 100 UNIT/ML ~~LOC~~ SOLN
0.0000 [IU] | Freq: Three times a day (TID) | SUBCUTANEOUS | Status: DC
  Administered 2019-11-23 (×2): 1 [IU] via SUBCUTANEOUS
  Administered 2019-11-23: 2 [IU] via SUBCUTANEOUS
  Administered 2019-11-24: 1 [IU] via SUBCUTANEOUS
  Administered 2019-11-24 (×2): 2 [IU] via SUBCUTANEOUS
  Administered 2019-11-25: 1 [IU] via SUBCUTANEOUS
  Administered 2019-11-25 – 2019-11-26 (×4): 2 [IU] via SUBCUTANEOUS
  Administered 2019-11-26: 1 [IU] via SUBCUTANEOUS
  Administered 2019-11-27 (×2): 2 [IU] via SUBCUTANEOUS

## 2019-11-22 MED ORDER — INSULIN GLARGINE 100 UNIT/ML ~~LOC~~ SOLN
18.0000 [IU] | Freq: Every day | SUBCUTANEOUS | Status: DC
  Administered 2019-11-22 – 2019-11-26 (×5): 18 [IU] via SUBCUTANEOUS
  Filled 2019-11-22: qty 1
  Filled 2019-11-22 (×6): qty 0.18

## 2019-11-22 MED ORDER — LEVOTHYROXINE SODIUM 25 MCG PO TABS
50.0000 ug | ORAL_TABLET | Freq: Every day | ORAL | Status: DC
  Administered 2019-11-23 – 2019-11-27 (×5): 50 ug via ORAL
  Filled 2019-11-22 (×5): qty 2

## 2019-11-22 MED ORDER — TRAZODONE HCL 50 MG PO TABS
50.0000 mg | ORAL_TABLET | Freq: Every day | ORAL | Status: DC
  Administered 2019-11-22 – 2019-11-26 (×5): 50 mg via ORAL
  Filled 2019-11-22 (×5): qty 1

## 2019-11-22 MED ORDER — SODIUM CHLORIDE 0.9 % IV BOLUS
500.0000 mL | Freq: Once | INTRAVENOUS | Status: AC
  Administered 2019-11-22: 500 mL via INTRAVENOUS

## 2019-11-22 MED ORDER — ONDANSETRON HCL 4 MG/2ML IJ SOLN: 4.0000 mg | Freq: Four times a day (QID) | INTRAMUSCULAR | Status: DC | PRN

## 2019-11-22 MED ORDER — DULOXETINE HCL 60 MG PO CPEP
60.0000 mg | ORAL_CAPSULE | Freq: Every day | ORAL | Status: DC
  Administered 2019-11-23 – 2019-11-27 (×5): 60 mg via ORAL
  Filled 2019-11-22 (×5): qty 1

## 2019-11-22 MED ORDER — SODIUM CHLORIDE 0.9 % IV SOLN: INTRAVENOUS | Status: DC

## 2019-11-22 MED ORDER — POLYETHYLENE GLYCOL 3350 17 G PO PACK: 17.0000 g | PACK | Freq: Every day | ORAL | Status: DC | PRN

## 2019-11-22 MED ORDER — ACETAMINOPHEN 325 MG PO TABS
650.0000 mg | ORAL_TABLET | Freq: Four times a day (QID) | ORAL | Status: DC | PRN
  Administered 2019-11-24 – 2019-11-27 (×5): 650 mg via ORAL
  Filled 2019-11-22 (×5): qty 2

## 2019-11-22 MED ORDER — HYDRALAZINE HCL 25 MG PO TABS
100.0000 mg | ORAL_TABLET | Freq: Three times a day (TID) | ORAL | Status: DC
  Administered 2019-11-22 – 2019-11-23 (×2): 100 mg via ORAL
  Filled 2019-11-22 (×2): qty 4

## 2019-11-22 MED ORDER — GABAPENTIN 100 MG PO CAPS
100.0000 mg | ORAL_CAPSULE | Freq: Two times a day (BID) | ORAL | Status: DC
  Administered 2019-11-22 – 2019-11-27 (×10): 100 mg via ORAL
  Filled 2019-11-22 (×10): qty 1

## 2019-11-22 MED ORDER — PANTOPRAZOLE SODIUM 40 MG PO TBEC
40.0000 mg | DELAYED_RELEASE_TABLET | Freq: Every day | ORAL | Status: DC
  Administered 2019-11-23 – 2019-11-27 (×5): 40 mg via ORAL
  Filled 2019-11-22 (×5): qty 1

## 2019-11-22 MED ORDER — LEVETIRACETAM 250 MG PO TABS
250.0000 mg | ORAL_TABLET | Freq: Two times a day (BID) | ORAL | Status: DC
  Administered 2019-11-22 – 2019-11-27 (×10): 250 mg via ORAL
  Filled 2019-11-22 (×10): qty 1

## 2019-11-22 MED ORDER — INSULIN ASPART 100 UNIT/ML ~~LOC~~ SOLN: 0.0000 [IU] | Freq: Every day | SUBCUTANEOUS | Status: DC

## 2019-11-22 MED ORDER — PRIMIDONE 50 MG PO TABS
50.0000 mg | ORAL_TABLET | Freq: Every morning | ORAL | Status: DC
  Administered 2019-11-23 – 2019-11-27 (×5): 50 mg via ORAL
  Filled 2019-11-22 (×6): qty 1

## 2019-11-22 MED ORDER — DULAGLUTIDE 1.5 MG/0.5ML ~~LOC~~ SOAJ: 1.5000 mg | SUBCUTANEOUS | Status: DC

## 2019-11-22 MED ORDER — RISPERIDONE 0.5 MG PO TABS
0.5000 mg | ORAL_TABLET | Freq: Two times a day (BID) | ORAL | Status: DC
  Administered 2019-11-22 – 2019-11-27 (×10): 0.5 mg via ORAL
  Filled 2019-11-22 (×10): qty 1

## 2019-11-22 MED ORDER — APIXABAN 5 MG PO TABS
5.0000 mg | ORAL_TABLET | Freq: Two times a day (BID) | ORAL | Status: DC
  Administered 2019-11-22: 5 mg via ORAL
  Filled 2019-11-22 (×2): qty 1

## 2019-11-22 MED ORDER — ACETAMINOPHEN 650 MG RE SUPP: 650.0000 mg | Freq: Four times a day (QID) | RECTAL | Status: DC | PRN

## 2019-11-22 NOTE — ED Notes (Signed)
[facility] called spoke with Ruby to page Cardiologist on call for Dr. Karle Starch.

## 2019-11-22 NOTE — Progress Notes (Signed)
CRITICAL VALUE ALERT  Critical Value:  Troponin 380  Date & Time Notied:  11/22/19, 2320  Provider Notified: Sharlet Salina  Orders Received/Actions taken:

## 2019-11-22 NOTE — ED Notes (Signed)
ED Provider at bedside. 

## 2019-11-22 NOTE — ED Notes (Signed)
Fluids and Cardizem stopped per Dr Karle Starch

## 2019-11-22 NOTE — ED Triage Notes (Signed)
Pt brought in by EMS from [facility] due to increase HR. Pt had received 2nd covid shot at 1130 today. Pt is a resident of [facility]. Denies pain

## 2019-11-22 NOTE — ED Notes (Signed)
Date and time results received: 11/22/19 7:47 PM (use smartphrase ".now" to insert current time)  Test: trop Critical Value: 181  Name of Provider Notified: Emokpae  Orders Received? Or Actions Taken?: Actions Taken: admit

## 2019-11-22 NOTE — ED Provider Notes (Signed)
[facility] EMERGENCY DEPARTMENT Provider Note   CSN: [id number] Arrival date & time: 11/22/19  1351     History Chief Complaint  Patient presents with  . Tachycardia    [name] is a 69 y.o. female.  Patient with end-stage renal disease on dialysis normally dialyzed Tuesday Thursdays and Saturdays.  Patient was dialyzed today for an hour and a half.  Patient felt as if she was having palpitation and rapid heart rate this morning when she got up.  They did dialyze her.  Not sure what her heart rate was when they started dialysis but at the end it certainly was in the 150 range.  Patient transferred here for further evaluation.  Patient has a history of diastolic heart failure as well as the kidney disease.  Never had any history of rapid heart rate but going through her records it looks as if maybe she had an atrial flutter type pattern in the past.  Patient denies any shortness of breath chest pain or any other respiratory problems.        Past Medical History:  Diagnosis Date  . Anxiety   . Asthma   . Back pain   . Barrett esophagus   . CHF (congestive heart failure) ([place])   . CKD (chronic kidney disease) stage 3, GFR 30-59 ml/min   . COPD (chronic obstructive pulmonary disease) ([place])   . Depression   . Diastolic heart failure ([place])   . Essential hypertension   . Fibromyalgia   . GERD (gastroesophageal reflux disease)   . Gout   . History of pericarditis   . Hyperlipidemia   . Hypothyroid   . Major depressive disorder   . Memory loss   . Nephrolithiasis    2008  . Obesity hypoventilation syndrome (HCC)    CPAP  . Obstructive sleep apnea   . PE (pulmonary embolism)    2010  . Peripheral neuropathy   . PTSD (post-traumatic stress disorder)   . Rheumatoid arthritis ([place])   . Schizophrenia ([place])   . 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 Seizures ([place])   . Steroid dependence ([place])   . Type 2 diabetes mellitus ([place])   . Vitamin D deficiency      Patient Active Problem List   Diagnosis Date Noted  . Acute pulmonary edema (HCC)   . Acute respiratory failure with hypoxia and hypercapnia ([place]) 09/10/2019  . SVT (supraventricular tachycardia) ([place]) 09/10/2019  . On mechanically assisted ventilation ([place]) 09/08/2019  . Acute metabolic encephalopathy 00/93/8182  . Volume overload 09/06/2019  . AKI (acute kidney injury) ([place]) 09/06/2019  . Altered mental state 11/28/2018  . Near syncope 11/28/2018  . Fall at home 11/28/2018  . History of seizure 11/28/2018  . 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 Pressure injury of skin 09/17/2018  . Respiratory failure ([place]) 08/16/2018  . Seizure ([place]) 08/16/2018  . Acute encephalopathy 07/27/2018  . Endotracheally intubated 07/27/2018  . Hypothyroidism 07/27/2018  . Pleuritic chest pain 07/14/2018  . Moderate persistent asthma without complication   . Type 2 diabetes mellitus ([place]) 04/19/2018  . Acute kidney injury superimposed on chronic kidney disease ([place]) 01/13/2018  . Altered mental status   . Encephalopathy 10/11/2016  . Chest pain 08/12/2016  . CKD (chronic kidney disease), stage IV ([place]) 08/12/2016  . History of pulmonary embolus (PE) 08/12/2016  . Obesity, Class III, BMI 40-49.9 (morbid obesity) ([place]) 08/12/2016  . RBBB 08/12/2016  . Positive D dimer 08/12/2016  . Cerebral infarction (  [place]) 11/10/2015  . Physical deconditioning 07/01/2011  . Weakness 07/01/2011  . Diabetic foot ulcer ([place]) 05/19/2011  . Polypharmacy 02/09/2011  . BACK PAIN WITH RADICULOPATHY 05/25/2010  . Type 2 diabetes mellitus with diabetic polyneuropathy, without long-term current use of insulin ([place]) 08/18/2009  . Chronic diastolic heart failure ([place]) 04/25/2009  . Normocytic anemia 04/14/2009  . Sleep apnea 12/11/2008  . Diabetic peripheral neuropathy ([place]) 05/04/2007  . HYPERCHOLESTEROLEMIA 10/13/2006  . Gout, unspecified 10/13/2006  . HYPOKALEMIA 10/13/2006  . Schizophrenia ([place])  10/13/2006  . Depression 10/13/2006  . Essential hypertension 10/13/2006  . Venous (peripheral) insufficiency 10/13/2006  . RHINITIS, ALLERGIC 10/13/2006  . Asthma 10/13/2006  . Reflux esophagitis 10/13/2006  . OSTEOARTHRITIS, MULTI SITES 10/13/2006  . INCONTINENCE, URGE 10/13/2006    Past Surgical History:  Procedure Laterality Date  . APPENDECTOMY    . AV FISTULA PLACEMENT Right 09/27/2019   Procedure: ARTERIOVENOUS (AV) FISTULA CREATION;  Surgeon: Waynetta Sandy, MD;  Location: [facility];  Service: Vascular;  Laterality: Right;  . CHOLECYSTECTOMY    . COLONOSCOPY     benign polyps (12/2000), repeat colonoscopy performed in 2007  . ENDOMETRIAL BIOPSY     10/03 benign columnar mucosa (limited material)  . INSERTION OF DIALYSIS CATHETER Right 09/14/2019   Procedure: ATTEMPTED INSERTION OF DIALYSIS CATHETER;  Surgeon: Virl Cagey, MD;  Location: [facility];  Service: General;  Laterality: Right;  . INSERTION OF DIALYSIS CATHETER Right 09/27/2019   Procedure: INSERTION OF DIALYSIS CATHETER;  Surgeon: Waynetta Sandy, MD;  Location: [facility];  Service: Vascular;  Laterality: Right;  . KIDNEY SURGERY       OB History   No obstetric history on file.     Family History  Problem Relation Age of Onset  . Bone cancer Mother   . Hypertension Mother   . Heart disease Mother   . COPD Father   . Heart disease Father   . Stroke Father     Social History   Tobacco Use  . Smoking status: Former Smoker    Types: Cigarettes    Quit date: 06/23/1972    Years since quitting: 47.4  . Smokeless tobacco: Never Used  Substance Use Topics  . Alcohol use: No  . Drug use: No    Home Medications Prior to Admission medications   Medication Sig Start Date End Date Taking? Authorizing Provider  acetaminophen (TYLENOL) 325 MG tablet Take 325 mg by mouth 2 (two) times daily.     [provider]  albuterol (PROVENTIL) (2.5 MG/3ML) 0.083% nebulizer solution Take 2.5 mg  by nebulization every 6 (six) hours as needed for wheezing or shortness of breath.    [provider]  albuterol (VENTOLIN HFA) 108 (90 Base) MCG/ACT inhaler Inhale 1 puff into the lungs every 6 (six) hours as needed for wheezing or shortness of breath.    [provider]  apixaban (ELIQUIS) 5 MG TABS tablet Take 1 tablet (5 mg total) by mouth 2 (two) times daily. 10/01/19   Shelly Coss, MD  aspirin 81 MG tablet Take 81 mg by mouth every morning.     [provider]  atorvastatin (LIPITOR) 20 MG tablet Take 1 tablet by mouth at bedtime.  05/14/19   [provider]  calcitRIOL (ROCALTROL) 0.25 MCG capsule Take 1 capsule (0.25 mcg total) by mouth every Monday, Wednesday, and Friday. 10/01/19   Shelly Coss, MD  calcium carbonate (TUMS - DOSED IN MG ELEMENTAL CALCIUM) 500 MG chewable tablet Chew  1 tablet by mouth daily as needed for indigestion or heartburn.    [provider]  carvedilol (COREG) 25 MG tablet Take 1 tablet (25 mg total) by mouth 2 (two) times daily with a meal. 10/01/19   Shelly Coss, MD  Cholecalciferol (VITAMIN D3) 50 MCG (2000 UT) TABS Take 4,000 Units by mouth every morning.     [provider]  Dextromethorphan-guaiFENesin 10-100 MG/5ML liquid Take 5 mLs by mouth daily as needed (cough).    [provider]  docusate sodium (COLACE) 100 MG capsule Take 1 capsule (100 mg total) by mouth 2 (two) times daily. Hold for diarrhea Patient taking differently: Take 100 mg by mouth daily as needed. Hold for diarrhea 11/29/18   Barton Dubois, MD  Dulaglutide (TRULICITY) 1.5 NL/8.9QJ SOPN Inject 1.5 mg into the skin every Friday.     [provider]  DULoxetine (CYMBALTA) 60 MG capsule Take 1 capsule by mouth daily. 05/11/19   [provider]  EPINEPHrine (EPI-PEN) 0.3 mg/0.3 mL DEVI Inject 0.3 mg into the muscle once as needed (for bee stings, then go to the ED immediately after using).     [provider]  gabapentin (NEURONTIN) 100 MG capsule Take 1 capsule (100 mg total) by mouth 3 (three) times daily. 10/01/19   Shelly Coss, MD  hydrALAZINE (APRESOLINE) 100 MG tablet Take 1 tablet (100 mg total) by mouth every 8 (eight) hours. 10/01/19   Shelly Coss, MD  hydrOXYzine (ATARAX/VISTARIL) 25 MG tablet Take 25 mg by mouth 2 (two) times daily as needed for anxiety.     [provider]  insulin degludec (TRESIBA FLEXTOUCH) 100 UNIT/ML SOPN FlexTouch Pen Inject 18 Units into the skin at bedtime.     [provider]  levothyroxine (SYNTHROID, LEVOTHROID) 25 MCG tablet Take 1 tablet (25 mcg total) by mouth daily at 6 (six) AM. 08/19/18   Lavina Hamman, MD  linagliptin (TRADJENTA) 5 MG TABS tablet Take 5 mg by mouth every morning.     [provider]  LORazepam (ATIVAN) 1 MG tablet Take 1 tablet (1 mg total) by mouth daily as needed for anxiety. 10/01/19   Shelly Coss, MD  montelukast (SINGULAIR) 10 MG tablet Take 10 mg by mouth at bedtime.    [provider]  Multiple Vitamin (DAILY VITE PO) Take 1 tablet by mouth every morning.     [provider]  omeprazole (PRILOSEC) 20 MG capsule Take 20 mg by mouth every morning.     [provider]  predniSONE (DELTASONE) 10 MG tablet Take 10 mg by mouth at bedtime.    [provider]  primidone (MYSOLINE) 50 MG tablet Take 50 mg by mouth every morning.    [provider]  pyridOXINE (VITAMIN B-6) 100 MG tablet Take 200 mg by mouth 2 (two) times daily.    [provider]  risperiDONE (RISPERDAL) 0.5 MG tablet Take 1 tablet by mouth 2 (two) times daily. 05/11/19   [provider]  sodium phosphate (FLEET) 7-19 GM/118ML ENEM Place 1 enema rectally daily as needed for mild constipation or severe constipation.    [provider]  traMADol (ULTRAM) 50 MG tablet Take 1 tablet (50 mg total) by mouth every 6 (six) hours as needed for moderate pain. 10/01/19    Shelly Coss, MD  traZODone (DESYREL) 50 MG tablet Take 1 tablet by mouth daily. 05/11/19   [provider]  Vitamins A & D (VITAMIN A & D) ointment Apply  1 application topically See admin instructions. To be applied after baths in the morning due to rash under skin folds    [provider]    Allergies    Benztropine mesylate, Codeine, Diazepam, Diphenhydramine hcl, Penicillins, Sulfonamide derivatives, Thioridazine hcl, and Lisinopril  Review of Systems   Review of Systems  Constitutional: Negative for chills and fever.  HENT: Negative for rhinorrhea and sore throat.   Eyes: Negative for visual disturbance.  Respiratory: Negative for cough and shortness of breath.   Cardiovascular: Positive for palpitations. Negative for chest pain and leg swelling.  Gastrointestinal: Negative for abdominal pain, diarrhea, nausea and vomiting.  Genitourinary: Negative for dysuria.  Musculoskeletal: Negative for back pain and neck pain.  Skin: Negative for rash.  Neurological: Negative for dizziness, light-headedness and headaches.  Hematological: Does not bruise/bleed easily.  Psychiatric/Behavioral: Negative for confusion.    Physical Exam Updated Vital Signs BP (!) 151/84   Pulse (!) 151   Temp 98.4 F (36.9 C) (Oral)   Resp 17   Wt 87.5 kg   SpO2 97%   BMI 32.10 kg/m   Physical Exam Vitals and nursing note reviewed.  Constitutional:      General: She is not in acute distress.    Appearance: She is well-developed.  HENT:     Head: Normocephalic and atraumatic.  Eyes:     Conjunctiva/sclera: Conjunctivae normal.     Pupils: Pupils are equal, round, and reactive to light.  Cardiovascular:     Rate and Rhythm: Regular rhythm. Tachycardia present.     Heart sounds: No murmur.  Pulmonary:     Effort: Pulmonary effort is normal. No respiratory distress.     Breath sounds: Normal breath sounds.     Comments: Left anterior chest has AV fistula catheter in  place. Abdominal:     Palpations: Abdomen is soft.     Tenderness: There is no abdominal tenderness.  Musculoskeletal:        General: No swelling.     Cervical back: Normal range of motion and neck supple.  Skin:    General: Skin is warm and dry.  Neurological:     General: No focal deficit present.     Mental Status: She is alert and oriented to person, place, and time.     Cranial Nerves: No cranial nerve deficit.     Sensory: No sensory deficit.     Motor: No weakness.     ED Results / Procedures / Treatments   Labs (all labs ordered are listed, but only abnormal results are displayed) Labs Reviewed - No data to display  EKG EKG Interpretation  Date/Time:  Thursday November 22 2019 13:59:13 EDT Ventricular Rate:  149 PR Interval:    QRS Duration: 162 QT Interval:  351 QTC Calculation: 553 R Axis:   -73 Text Interpretation: Sinus tachycardia RBBB and LAFB Left ventricular hypertrophy Confirmed by Fredia Sorrow [id number]) on 11/22/2019 2:05:48 PM   Radiology No results found.  Procedures Procedures (including critical care time)  CRITICAL CARE Performed by: Fredia Sorrow Total critical care time: 30 minutes Critical care time was exclusive of separately billable procedures and treating other patients. Critical care was necessary to treat or prevent imminent or life-threatening deterioration. Critical care was time spent personally by me on the following activities: development of treatment plan with patient and/or surrogate as well as nursing, discussions with consultants, evaluation of patient's response to treatment, examination of patient, obtaining history from patient or surrogate, ordering and performing  treatments and interventions, ordering and review of laboratory studies, ordering and review of radiographic studies, pulse oximetry and re-evaluation of patient's condition.   Medications Ordered in ED Medications - No data to display  ED Course  I have  reviewed the triage vital signs and the nursing notes.  Pertinent labs & imaging results that were available during my care of the patient were reviewed by me and considered in my medical decision making (see chart for details).    MDM Rules/Calculators/A&P                      Patient presenting with a significant tachycardia.  Patient has a left bundle branch and right bundle branch block pattern from old.  Possible this could be atrial flutter with a 2-1 block.  Or just a sinus tachycardia.  Patient given 500 cc bolus of fluid started on diltiazem drip.  Not given a bolus.  Chest x-ray without a lot of acute findings.  Labs ordered including blood cultures although no fever.  Lactic acid troponin and electrolytes.  On patient CBC hemoglobin looks well concentrated.  May have had a little bit too much fluid drawn off.  If patient does not convert back to a normal rhythm and staying that she will probably require admission.  Depending if all the other labs and results are normal.  Patient turned over to the evening emergency physician Dr. Karle Starch will follow up on the patient. Final Clinical Impression(s) / ED Diagnoses Final diagnoses:  None    Rx / DC Orders ED Discharge Orders    None       Fredia Sorrow, MD 11/22/19 1535

## 2019-11-22 NOTE — H&P (Signed)
History and Physical    Mikalia Fessel SAY:[id number] DOB: 06-25-1951 DOA: 11/22/2019  PCP: Glenda Chroman, MD   Patient coming from: [facility] resident  I have personally briefly reviewed patient's old medical records in [place]  Chief Complaint: Elevated heart rate  HPI: [name] is a 69 y.o. female with medical history significant for diabetes mellitus, schizophrenia, GERD, depression, hypertension, diastolic CHF, ESRD, paroxysmal atrial fibrillation, seizure disorder and pulmonary embolism. Patient was brought to the ED with reports of elevated heart rate.  Patient tells me she was aware of this, this morning, felt like her heart was beating very fast.  She went for dialysis, she tells me during dialysis her heart rate continued to increase.  Patient was sent to the ED.  Per triage notes, patient had 1.5 hours of her 4 hours of HD today.  She still makes urine.  She denies difficulty breathing, no chest pain.  Recent prolonged hospitalization-1/21-2/15, for acute on chronic kidney disease stage V, acute hypoxic and hypercapnic respiratory failure requiring intubation due to altered mental status, secondary to pneumonia, chest CTA negative for PE.  Patient was volume overloaded, with worsening CKD with diuresis.  Hemodialysis was started 2/9.  Also diagnosed with paroxysmal atrial fibrillation during hospitalization, and was discharged on Eliquis 2.5 mg twice daily.  But on questioning patient today, she she does not think/is not aware she is on blood thinner.    ED Course: heart rate initially in the 150s, blood pressure 130s to 160s.  O2 sats greater than 92% on room air.  Troponin 81 > 40 > 181.  Chest x-ray-decreased conspicuity of pulmonary vascular congestion.  BNP elevated 1131.  500 mils normal saline bolus given.   Patient spontaneously converted back to sinus rhythm-rate 99, but ~2 hours later converted back into atrial fibrillation/flutter rate- 142, and  then within an hour converted back again to sinus rhythm-rate 80. Hospitalist to admit for further evaluation and management.  Review of Systems: As per HPI all other systems reviewed and negative.  Past Medical History:  Diagnosis Date  . Anxiety   . Asthma   . Back pain   . Barrett esophagus   . CHF (congestive heart failure) ([place])   . CKD (chronic kidney disease) stage 3, GFR 30-59 ml/min   . COPD (chronic obstructive pulmonary disease) ([place])   . Depression   . Diastolic heart failure ([place])   . Essential hypertension   . Fibromyalgia   . GERD (gastroesophageal reflux disease)   . Gout   . History of pericarditis   . Hyperlipidemia   . Hypothyroid   . Major depressive disorder   . Memory loss   . Nephrolithiasis    2008  . Obesity hypoventilation syndrome (HCC)    CPAP  . Obstructive sleep apnea   . PE (pulmonary embolism)    2010  . Peripheral neuropathy   . PTSD (post-traumatic stress disorder)   . Rheumatoid arthritis ([place])   . Schizophrenia ([place])   . 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 Seizures ([place])   . Steroid dependence ([place])   . Type 2 diabetes mellitus ([place])   . Vitamin D deficiency     Past Surgical History:  Procedure Laterality Date  . APPENDECTOMY    . AV FISTULA PLACEMENT Right 09/27/2019   Procedure: ARTERIOVENOUS (AV) FISTULA CREATION;  Surgeon: Waynetta Sandy, MD;  Location: [facility];  Service: Vascular;  Laterality: Right;  . CHOLECYSTECTOMY    . COLONOSCOPY  benign polyps (12/2000), repeat colonoscopy performed in 2007  . ENDOMETRIAL BIOPSY     10/03 benign columnar mucosa (limited material)  . INSERTION OF DIALYSIS CATHETER Right 09/14/2019   Procedure: ATTEMPTED INSERTION OF DIALYSIS CATHETER;  Surgeon: Virl Cagey, MD;  Location: [facility];  Service: General;  Laterality: Right;  . INSERTION OF DIALYSIS CATHETER Right 09/27/2019   Procedure: INSERTION OF DIALYSIS CATHETER;  Surgeon: Waynetta Sandy, MD;   Location: [facility];  Service: Vascular;  Laterality: Right;  . KIDNEY SURGERY       reports that she quit smoking about 47 years ago. Her smoking use included cigarettes. She has never used smokeless tobacco. She reports that she does not drink alcohol or use drugs.  Allergies  Allergen Reactions  . Benztropine Mesylate Other (See Comments)    Alopecia and white spots on eyes  . Codeine Swelling, Palpitations and Other (See Comments)    Mouth Swelling and Tachycardia   . Diazepam Other (See Comments)    COMA  . Diphenhydramine Hcl Anxiety, Palpitations and Other (See Comments)    Tachycardia and anxiousness  . Penicillins Swelling and Other (See Comments)    REACTION: ulcers in mouth, swelling, throat swelling  Has patient had a PCN reaction causing immediate rash, facial/tongue/throat swelling, SOB or lightheadedness with hypotension: YES Has patient had a PCN reaction causing severe rash involving Mucus membranes or skin necrosis: Unknown Has patient had a PCN reaction that required hospitalization Unknown Has patient had a PCN reaction occurring within the last 10 years: unknown If all of the above answers are "NO", then may proceed with Cephalospori  . Sulfonamide Derivatives Rash    Blisters, also  . Thioridazine Hcl Swelling  . Lisinopril Cough    Family History  Problem Relation Age of Onset  . Bone cancer Mother   . Hypertension Mother   . Heart disease Mother   . COPD Father   . Heart disease Father   . Stroke Father     Prior to Admission medications   Medication Sig Start Date End Date Taking? Authorizing Provider  acetaminophen (TYLENOL) 325 MG tablet Take 325 mg by mouth 2 (two) times daily.     [provider]  albuterol (PROVENTIL) (2.5 MG/3ML) 0.083% nebulizer solution Take 2.5 mg by nebulization every 6 (six) hours as needed for wheezing or shortness of breath.    [provider]  albuterol (VENTOLIN HFA) 108 (90 Base) MCG/ACT inhaler Inhale  1 puff into the lungs every 6 (six) hours as needed for wheezing or shortness of breath.    [provider]  apixaban (ELIQUIS) 5 MG TABS tablet Take 1 tablet (5 mg total) by mouth 2 (two) times daily. 10/01/19   Shelly Coss, MD  aspirin 81 MG tablet Take 81 mg by mouth every morning.     [provider]  atorvastatin (LIPITOR) 20 MG tablet Take 1 tablet by mouth at bedtime.  05/14/19   [provider]  calcitRIOL (ROCALTROL) 0.25 MCG capsule Take 1 capsule (0.25 mcg total) by mouth every Monday, Wednesday, and Friday. 10/01/19   Shelly Coss, MD  calcium carbonate (TUMS - DOSED IN MG ELEMENTAL CALCIUM) 500 MG chewable tablet Chew 1 tablet by mouth daily as needed for indigestion or heartburn.    [provider]  carvedilol (COREG) 25 MG tablet Take 1 tablet (25 mg total) by mouth 2 (two) times daily with a meal. 10/01/19   Shelly Coss, MD  Cholecalciferol (VITAMIN D3) 50  MCG (2000 UT) TABS Take 4,000 Units by mouth every morning.     [provider]  Dextromethorphan-guaiFENesin 10-100 MG/5ML liquid Take 5 mLs by mouth daily as needed (cough).    [provider]  docusate sodium (COLACE) 100 MG capsule Take 1 capsule (100 mg total) by mouth 2 (two) times daily. Hold for diarrhea Patient taking differently: Take 100 mg by mouth daily as needed. Hold for diarrhea 11/29/18   Barton Dubois, MD  Dulaglutide (TRULICITY) 1.5 GE/3.6OQ SOPN Inject 1.5 mg into the skin every Friday.     [provider]  DULoxetine (CYMBALTA) 60 MG capsule Take 1 capsule by mouth daily. 05/11/19   [provider]  EPINEPHrine (EPI-PEN) 0.3 mg/0.3 mL DEVI Inject 0.3 mg into the muscle once as needed (for bee stings, then go to the ED immediately after using).     [provider]  gabapentin (NEURONTIN) 100 MG capsule Take 1 capsule (100 mg total) by mouth 3 (three) times daily. 10/01/19   Shelly Coss, MD  hydrALAZINE (APRESOLINE) 100 MG  tablet Take 1 tablet (100 mg total) by mouth every 8 (eight) hours. 10/01/19   Shelly Coss, MD  hydrOXYzine (ATARAX/VISTARIL) 25 MG tablet Take 25 mg by mouth 2 (two) times daily as needed for anxiety.     [provider]  insulin degludec (TRESIBA FLEXTOUCH) 100 UNIT/ML SOPN FlexTouch Pen Inject 18 Units into the skin at bedtime.     [provider]  levothyroxine (SYNTHROID, LEVOTHROID) 25 MCG tablet Take 1 tablet (25 mcg total) by mouth daily at 6 (six) AM. 08/19/18   Lavina Hamman, MD  linagliptin (TRADJENTA) 5 MG TABS tablet Take 5 mg by mouth every morning.     [provider]  LORazepam (ATIVAN) 1 MG tablet Take 1 tablet (1 mg total) by mouth daily as needed for anxiety. 10/01/19   Shelly Coss, MD  montelukast (SINGULAIR) 10 MG tablet Take 10 mg by mouth at bedtime.    [provider]  Multiple Vitamin (DAILY VITE PO) Take 1 tablet by mouth every morning.     [provider]  omeprazole (PRILOSEC) 20 MG capsule Take 20 mg by mouth every morning.     [provider]  predniSONE (DELTASONE) 10 MG tablet Take 10 mg by mouth at bedtime.    [provider]  primidone (MYSOLINE) 50 MG tablet Take 50 mg by mouth every morning.    [provider]  pyridOXINE (VITAMIN B-6) 100 MG tablet Take 200 mg by mouth 2 (two) times daily.    [provider]  risperiDONE (RISPERDAL) 0.5 MG tablet Take 1 tablet by mouth 2 (two) times daily. 05/11/19   [provider]  sodium phosphate (FLEET) 7-19 GM/118ML ENEM Place 1 enema rectally daily as needed for mild constipation or severe constipation.    [provider]  traMADol (ULTRAM) 50 MG tablet Take 1 tablet (50 mg total) by mouth every 6 (six) hours as needed for moderate pain. 10/01/19   Shelly Coss, MD  traZODone (DESYREL) 50 MG tablet Take 1 tablet by mouth daily. 05/11/19   [provider]  Vitamins A & D (VITAMIN A & D) ointment Apply 1  application topically See admin instructions. To be applied after baths in the morning due to rash under skin folds    [provider]    Physical Exam: Vitals:   11/22/19 1500 11/22/19 1530 11/22/19 1545 11/22/19 1600  BP: 135/82 138/62  (!) 141/61  Pulse: (!) 148 (!) 56 77 79  Resp: 14 20 17 18   Temp:      TempSrc:      SpO2: 94% 93% 92% 93%  Weight:        Constitutional: NAD, calm, comfortable Vitals:   11/22/19 1500 11/22/19 1530 11/22/19 1545 11/22/19 1600  BP: 135/82 138/62  (!) 141/61  Pulse: (!) 148 (!) 56 77 79  Resp: 14 20 17 18   Temp:      TempSrc:      SpO2: 94% 93% 92% 93%  Weight:       Eyes: PERRL, lids and conjunctivae normal ENMT: Mucous membranes are moist.  Neck: normal, supple, no masses, no thyromegaly Respiratory: clear to auscultation bilaterally, no wheezing, no crackles. Normal respiratory effort. No accessory muscle use.  Cardiovascular: Currently tachycardic in atrial fibrillation with RVR., no murmurs / rubs / gallops. Trace extremity edema, R > L. 2+ pedal pulses.  Abdomen: no tenderness, no masses palpated. No hepatosplenomegaly. Bowel sounds positive.  Musculoskeletal: no clubbing / cyanosis. No joint deformity upper and lower extremities. Good ROM, no contractures. Normal muscle tone.  Skin: no rashes, lesions, ulcers. No induration Neurologic: No apparent cranial nerve abnormality, moving all extremities spontaneously. Psychiatric: Normal judgment and insight. Alert and oriented x 3. Normal mood.   Labs on Admission: I have personally reviewed following labs and imaging studies  CBC: Recent Labs  Lab 11/22/19 1418  WBC 7.9  NEUTROABS 7.0  HGB 10.3*  HCT 32.1*  MCV 103.9*  PLT 782   Basic Metabolic Panel: Recent Labs  Lab 11/22/19 1418  NA 135  K 3.7  CL 99  CO2 26  GLUCOSE 211*  BUN 36*  CREATININE 2.86*  CALCIUM 8.2*   Liver Function Tests: Recent Labs  Lab 11/22/19 1418  AST 32  ALT 25  ALKPHOS 59    BILITOT 0.7  PROT 5.9*  ALBUMIN 3.1*    Urine analysis:    Component Value Date/Time   COLORURINE YELLOW 11/22/2019 1449   APPEARANCEUR CLEAR 11/22/2019 1449   LABSPEC 1.009 11/22/2019 1449   PHURINE 7.0 11/22/2019 1449   GLUCOSEU 150 (A) 11/22/2019 1449   HGBUR NEGATIVE 11/22/2019 1449   HGBUR negative 10/20/2009 1425   [place] 11/22/2019 1449   KETONESUR NEGATIVE 11/22/2019 1449   PROTEINUR >=300 (A) 11/22/2019 1449   UROBILINOGEN 0.2 10/05/2011 1002   NITRITE NEGATIVE 11/22/2019 1449   LEUKOCYTESUR TRACE (A) 11/22/2019 1449    Radiological Exams on Admission: DG Chest Port 1 View  Result Date: 11/22/2019 CLINICAL DATA:  Tachycardia. EXAM: PORTABLE CHEST 1 VIEW COMPARISON:  10/21/2019 and 09/27/2019 chest radiographs. FINDINGS: Tunneled left IJ central venous catheter tip at the superior cavoatrial junction. Minimal patchy left basilar opacities likely reflect atelectasis. No pneumothorax or pleural effusion. Decreased conspicuity of pulmonary vascular congestion and right hemidiaphragm elevation. Grossly unchanged cardiomegaly, accentuated by AP technique and mild lung hypoinflation. IMPRESSION: Decreased conspicuity of pulmonary vascular congestion. Left basilar atelectasis. Stable cardiomegaly. Electronically Signed   By: Primitivo Gauze M.D.   On: 11/22/2019 14:53    EKG: Independently reviewed.  EKG shows sinus rhythm rate 80.  Multiple PVCs.  QTc prolonged at 509.  Assessment/Plan Principal Problem:   Atrial fibrillation with rapid ventricular response (HCC) Active Problems:   Diabetic peripheral neuropathy (HCC)   Schizophrenia (HCC)   Depression   Essential hypertension   Chronic diastolic heart failure (HCC)   Type 2 diabetes mellitus (HCC)    Atrial fibrillation with RVR-symptomatic,  switching back and forth quite rapidly between atrial fibrillation with RVR and sinus rhythm.  Rates up to 150s.  Paroxysmal atrial fibrillation diagnosed on  recent hospitalization, discharged on Eliquis.  Last echo on file 2020-EF 60 to 65%, impaired diastolic relaxation, severely dilated left atrium.  No history of GI bleeds.  She denies frequent falls.  -Admit to stepdown, currently on Cardizem drip 5 mg/h, will wean off -Resume carvedilol at 25mg  every 12 hourly -IV metoprolol 5 mg for heart rate greater than 120. -Cardiology consult in a.m. -Updated echocardiogram -Resume home Eliquis -Hold aspirin -Mild increased troponin likely due to demand ischemia, trend troponin -Check TSH, magnesium  ESRD -schedule Tuesday Thursday Saturday.  Completed 1.5 hours of 4 hours of planned HD today.  On room air.  Not dyspneic. -Please consult nephrology in a.m.  Diastolic CHF-stable and compensated.  BNP elevated 1131 but in the setting of ESRD. -Resume home carvedilol -Volume status per HD.  Hypertension-stable. -Resume home hydralazine, carvedilol.  Hypothyroidism-  -Check TSH -Resume Synthroid  Chronic anemia-hemoglobin stable at 10.3, likely secondary to chronic renal disease.     Depression/schizophrenia -Resume home risperidone, Cymbalta  History of seizures -Resume home Keppra  Diabetes mellitus, with neuropathy-random glucose 211 -Resume home Tresiba at 18 units nightly - SSI- s -Resume home once weekly Trulicity -Resume home gabapentin  Rheumatoid arthritis-likely why she is on steroids. Steroid dependence listed on problem list. -Resume 10 mg prednisone daily.  DVT prophylaxis: Eliquis Code Status: Full code Family Communication: None at bedside Disposition Plan:  ~ 1- 2 days, pending improvement in heart rate, cardiology evaluation Consults called: Cardiology, nephrology Admission status: Inpatient, stepdown I certify that at the point of admission it is my clinical judgment that the patient will require inpatient hospital care spanning beyond 2 midnights from the point of admission due to high intensity of service, high  risk for further deterioration and high frequency of surveillance required.   Bethena Roys MD [facility]  11/22/2019, 8:55 PM

## 2019-11-22 NOTE — ED Notes (Signed)
Pt hr decreased to mid 80's. Repeat ekg performed, given to Dr Rogene Houston

## 2019-11-22 NOTE — ED Notes (Signed)
HR 139, ST. EDP notified and given updated EKG. Verbal order to restart cardizem

## 2019-11-22 NOTE — ED Notes (Signed)
Pt states that she first noticed her hr beating fast this am and that she did not feel well, advises that she told staff at [facility] and they sent her to dialysis, pt was able to finish 1.5 of a 4 hour dialysis treatment today.

## 2019-11-22 NOTE — ED Provider Notes (Addendum)
Care of the patient assumed a change of shift from Dr. Rogene Houston.  Patient brought to the ED from dialysis for rapid heart rate. Initial EKG felt to be sinus tachy but patient improved suddenly and repeat EKG shows NSR. Initial rhythm likely aflutter with 2:1 block in retrospect. Patient denies history of rapid or irregular heart problems but previous admission had PAF. Feels well, without complaint at the time of my evaluation.  Physical Exam  BP 135/82   Pulse (!) 148   Temp 98.4 F (36.9 C) (Oral)   Resp 14   Wt 87.5 kg   SpO2 94%   BMI 32.10 kg/m   Physical Exam HENT:     Head: Normocephalic.     Nose: Nose normal.  Eyes:     Extraocular Movements: Extraocular movements intact.  Cardiovascular:     Rate and Rhythm: Normal rate and regular rhythm.  Pulmonary:     Effort: Pulmonary effort is normal.     Comments: Dialysis access in L upper chest Musculoskeletal:        General: Normal range of motion.     Cervical back: Neck supple.  Skin:    Findings: No rash (on exposed skin).  Neurological:     Mental Status: She is alert and oriented to person, place, and time.  Psychiatric:        Mood and Affect: Mood normal.     ED Course/Procedures   Clinical Course as of Nov 21 1948  Thu Nov 22, 2019  1630 Patient resting comfortably in no distress. HR improved, now in NSR. Labs reviewed, ESRD without significant electrolyte abnormalities. Mild anemia, improved from baseline. BNP elevated but difficult to interpret in setting of ESRD. First Trop mildly elevated, will await second. If remains in NSR without significant increase in Trop may be able to go home.    [CS]  1726 Patient's second troponin has increased significantly. Will discuss admission for observation with Hospitalist.    [CS]  1750 Spoke with Dr. Arlyce Dice who requests that we  check a third Troponin and if improving patient may not require admission.She will be due for another Trop at around 1830hrs.    [CS]   4193 PAtient's HR has jumped back up to 140-150s. Will restart Cardizem drip. Dr. Denton Brick aware and will admit.    [CS]    Clinical Course User Index [CS] Truddie Hidden, MD    .Critical Care Performed by: Truddie Hidden, MD Authorized by: Truddie Hidden, MD   Critical care provider statement:    Critical care time (minutes):  35   Critical care time was exclusive of:  Separately billable procedures and treating other patients   Critical care was necessary to treat or prevent imminent or life-threatening deterioration of the following conditions:  Cardiac failure, circulatory failure and renal failure   Critical care was time spent personally by me on the following activities:  Development of treatment plan with patient or surrogate, discussions with consultants, discussions with primary provider, evaluation of patient's response to treatment, examination of patient, obtaining history from patient or surrogate, ordering and performing treatments and interventions, ordering and review of laboratory studies, ordering and review of radiographic studies, pulse oximetry, re-evaluation of patient's condition and review of old charts   I assumed direction of critical care for this patient from another provider in my specialty: yes          Truddie Hidden, MD 11/22/19 [place], [place],  MD 11/22/19 1950

## 2019-11-23 ENCOUNTER — Inpatient Hospital Stay (HOSPITAL_COMMUNITY): Payer: Medicare Other

## 2019-11-23 DIAGNOSIS — I10 Essential (primary) hypertension: Secondary | ICD-10-CM

## 2019-11-23 DIAGNOSIS — L02415 Cutaneous abscess of right lower limb: Secondary | ICD-10-CM

## 2019-11-23 DIAGNOSIS — L03115 Cellulitis of right lower limb: Secondary | ICD-10-CM

## 2019-11-23 DIAGNOSIS — I350 Nonrheumatic aortic (valve) stenosis: Secondary | ICD-10-CM | POA: Diagnosis not present

## 2019-11-23 DIAGNOSIS — N186 End stage renal disease: Secondary | ICD-10-CM | POA: Diagnosis not present

## 2019-11-23 DIAGNOSIS — I484 Atypical atrial flutter: Secondary | ICD-10-CM | POA: Diagnosis not present

## 2019-11-23 DIAGNOSIS — R778 Other specified abnormalities of plasma proteins: Secondary | ICD-10-CM | POA: Diagnosis not present

## 2019-11-23 DIAGNOSIS — I4892 Unspecified atrial flutter: Secondary | ICD-10-CM

## 2019-11-23 DIAGNOSIS — I361 Nonrheumatic tricuspid (valve) insufficiency: Secondary | ICD-10-CM

## 2019-11-23 DIAGNOSIS — I4891 Unspecified atrial fibrillation: Secondary | ICD-10-CM | POA: Diagnosis not present

## 2019-11-23 DIAGNOSIS — I5032 Chronic diastolic (congestive) heart failure: Secondary | ICD-10-CM

## 2019-11-23 DIAGNOSIS — R000 Tachycardia, unspecified: Secondary | ICD-10-CM | POA: Diagnosis not present

## 2019-11-23 LAB — CBC
HCT: 32.1 % — ABNORMAL LOW (ref 36.0–46.0)
Hemoglobin: 10 g/dL — ABNORMAL LOW (ref 12.0–15.0)
MCH: 32.9 pg (ref 26.0–34.0)
MCHC: 31.2 g/dL (ref 30.0–36.0)
MCV: 105.6 fL — ABNORMAL HIGH (ref 80.0–100.0)
Platelets: 196 10*3/uL (ref 150–400)
RBC: 3.04 MIL/uL — ABNORMAL LOW (ref 3.87–5.11)
RDW: 17 % — ABNORMAL HIGH (ref 11.5–15.5)
WBC: 9.8 10*3/uL (ref 4.0–10.5)
nRBC: 0 % (ref 0.0–0.2)

## 2019-11-23 LAB — URINALYSIS, COMPLETE (UACMP) WITH MICROSCOPIC
Bilirubin Urine: NEGATIVE
Glucose, UA: 50 mg/dL — AB
Ketones, ur: NEGATIVE mg/dL
Nitrite: NEGATIVE
Protein, ur: >=300 mg/dL — AB
Specific Gravity, Urine: 1.009 (ref 1.005–1.030)
WBC, UA: >50 WBC/hpf — ABNORMAL HIGH (ref 0–5)
pH: 6 (ref 5.0–8.0)

## 2019-11-23 LAB — BASIC METABOLIC PANEL
Anion gap: 11 (ref 5–15)
BUN: 42 mg/dL — ABNORMAL HIGH (ref 8–23)
CO2: 24 mmol/L (ref 22–32)
Calcium: 8.4 mg/dL — ABNORMAL LOW (ref 8.9–10.3)
Chloride: 102 mmol/L (ref 98–111)
Creatinine, Ser: 3.46 mg/dL — ABNORMAL HIGH (ref 0.44–1.00)
GFR calc Af Amer: 15 mL/min — ABNORMAL LOW (ref >60)
GFR calc non Af Amer: 13 mL/min — ABNORMAL LOW (ref >60)
Glucose, Bld: 164 mg/dL — ABNORMAL HIGH (ref 70–99)
Potassium: 3.9 mmol/L (ref 3.5–5.1)
Sodium: 137 mmol/L (ref 135–145)

## 2019-11-23 LAB — GLUCOSE, CAPILLARY
Glucose-Capillary: 135 mg/dL — ABNORMAL HIGH (ref 70–99)
Glucose-Capillary: 161 mg/dL — ABNORMAL HIGH (ref 70–99)
Glucose-Capillary: 144 mg/dL — ABNORMAL HIGH (ref 70–99)
Glucose-Capillary: 134 mg/dL — ABNORMAL HIGH (ref 70–99)

## 2019-11-23 LAB — HIV ANTIBODY (ROUTINE TESTING W REFLEX): HIV Screen 4th Generation wRfx: NONREACTIVE

## 2019-11-23 LAB — ECHOCARDIOGRAM COMPLETE
Height: 60 in
Weight: 3093.49 oz

## 2019-11-23 LAB — MRSA PCR SCREENING: MRSA by PCR: NEGATIVE

## 2019-11-23 MED ORDER — DILTIAZEM HCL 30 MG PO TABS
30.0000 mg | ORAL_TABLET | Freq: Four times a day (QID) | ORAL | Status: DC
  Administered 2019-11-23 – 2019-11-24 (×4): 30 mg via ORAL
  Filled 2019-11-23 (×4): qty 1

## 2019-11-23 MED ORDER — APIXABAN 2.5 MG PO TABS
2.5000 mg | ORAL_TABLET | Freq: Two times a day (BID) | ORAL | Status: DC
  Administered 2019-11-23 – 2019-11-24 (×3): 2.5 mg via ORAL
  Filled 2019-11-23 (×2): qty 1

## 2019-11-23 MED ORDER — METOPROLOL SUCCINATE ER 50 MG PO TB24
200.0000 mg | ORAL_TABLET | Freq: Every day | ORAL | Status: DC
  Administered 2019-11-24 – 2019-11-27 (×4): 200 mg via ORAL
  Filled 2019-11-23 (×4): qty 4

## 2019-11-23 MED ORDER — CHLORHEXIDINE GLUCONATE CLOTH 2 % EX PADS
6.0000 | MEDICATED_PAD | Freq: Every day | CUTANEOUS | Status: DC
  Administered 2019-11-24 – 2019-11-27 (×4): 6 via TOPICAL

## 2019-11-23 MED ORDER — VANCOMYCIN HCL IN DEXTROSE 1-5 GM/200ML-% IV SOLN
1000.0000 mg | INTRAVENOUS | Status: DC
  Administered 2019-11-24: 1000 mg via INTRAVENOUS
  Filled 2019-11-23: qty 200

## 2019-11-23 MED ORDER — DARBEPOETIN ALFA 200 MCG/0.4ML IJ SOSY
200.0000 ug | PREFILLED_SYRINGE | INTRAMUSCULAR | Status: DC
  Filled 2019-11-23: qty 0.4

## 2019-11-23 MED ORDER — CHLORHEXIDINE GLUCONATE CLOTH 2 % EX PADS
6.0000 | MEDICATED_PAD | Freq: Every day | CUTANEOUS | Status: DC
  Administered 2019-11-23 – 2019-11-27 (×5): 6 via TOPICAL

## 2019-11-23 MED ORDER — VANCOMYCIN HCL 1750 MG/350ML IV SOLN
1750.0000 mg | Freq: Once | INTRAVENOUS | Status: AC
  Administered 2019-11-23: 1750 mg via INTRAVENOUS
  Filled 2019-11-23: qty 350

## 2019-11-23 NOTE — Progress Notes (Addendum)
ANTICOAGULATION CONSULT NOTE - Initial Consult  Pharmacy Consult for:  apixaban dosing Indication: non-valvular Atrial fibrillation  Allergies  Allergen Reactions  . Benztropine Mesylate Other (See Comments)    Alopecia and white spots on eyes  . Codeine Swelling, Palpitations and Other (See Comments)    Mouth Swelling and Tachycardia   . Diazepam Other (See Comments)    COMA  . Diphenhydramine Hcl Anxiety, Palpitations and Other (See Comments)    Tachycardia and anxiousness  . Penicillins Swelling and Other (See Comments)    REACTION: ulcers in mouth, swelling, throat swelling  Has patient had a PCN reaction causing immediate rash, facial/tongue/throat swelling, SOB or lightheadedness with hypotension: YES Has patient had a PCN reaction causing severe rash involving Mucus membranes or skin necrosis: Unknown Has patient had a PCN reaction that required hospitalization Unknown Has patient had a PCN reaction occurring within the last 10 years: unknown If all of the above answers are "NO", then may proceed with Cephalospori  . Sulfonamide Derivatives Rash    Blisters, also  . Thioridazine Hcl Swelling  . Lisinopril Cough    Patient Measurements: Height: 5' (152.4 cm) Weight: 87.7 kg (193 lb 5.5 oz) IBW/kg (Calculated) : 45.5 Heparin Dosing Weight: HEPARIN DW (KG): 66.1   Vital Signs: Temp: 98.6 F (37 C) (04/09 0800) Temp Source: Oral (04/09 0800) BP: 127/55 (04/09 0800) Pulse Rate: 64 (04/09 0800)  Labs: Recent Labs    11/22/19 1418 11/22/19 1434 11/22/19 1627 11/22/19 1843 11/22/19 2137 11/23/19 0458  HGB 10.3*  --   --   --   --  10.0*  HCT 32.1*  --   --   --   --  32.1*  PLT 231  --   --   --   --  196  CREATININE 2.86*  --   --   --   --  3.46*  TROPONINIHS  --    < > 81* 181* 380*  --    < > = values in this interval not displayed.    Estimated Creatinine Clearance: 15.3 mL/min (A) (by C-G formula based on SCr of 3.46 mg/dL (H)).   Medical  History: Past Medical History:  Diagnosis Date  . Anxiety   . Asthma   . Back pain   . Barrett esophagus   . CHF (congestive heart failure) ([place])   . CKD (chronic kidney disease) stage 3, GFR 30-59 ml/min   . COPD (chronic obstructive pulmonary disease) ([place])   . Depression   . Diastolic heart failure ([place])   . Essential hypertension   . Fibromyalgia   . GERD (gastroesophageal reflux disease)   . Gout   . History of pericarditis   . Hyperlipidemia   . Hypothyroid   . Major depressive disorder   . Memory loss   . Nephrolithiasis    2008  . Obesity hypoventilation syndrome (HCC)    CPAP  . Obstructive sleep apnea   . PE (pulmonary embolism)    2010  . Peripheral neuropathy   . PTSD (post-traumatic stress disorder)   . Rheumatoid arthritis ([place])   . Schizophrenia ([place])   . 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 Seizures ([place])   . Steroid dependence ([place])   . Type 2 diabetes mellitus ([place])   . Vitamin D deficiency      Assessment: Pharmacy consulted to dose apixaban for this 69 yo female with atrial fibrillation on chronic anticoagulation with apixaban 5mg  bid prior to admission. Apixaban dose had  been reduced in the past to  2.5mg , but  pt doesn't meet criteria for dose reduction at this time  (age: 69   Wt: 88kg    SCr:3.46).   Patient  has CKD and has HD on TTS.  Her Hb is low at 10.0mg /dL.  Goal of Therapy:  Monitor platelets by anticoagulation protocol: Yes   Plan:  Continue home dose of apixaban 5mg  bid  Monitor patient for s/s of bleeding  Monitor low hemoglobin  Despina Pole, Pharm. D. Clinical Pharmacist 11/23/2019 11:50 AM     Despina Pole 11/23/2019,11:39 AM

## 2019-11-23 NOTE — Care Management (Signed)
Patient is from [facility]. CM has left several VM to notify of potential return the weekend.

## 2019-11-23 NOTE — Progress Notes (Signed)
PROGRESS NOTE  [name] ULA:[id number] DOB: [birth year] DOA: 11/22/2019 PCP: Glenda Chroman, MD  Brief History:  69 year old female with a history of ESRD (T-Th-Sat), paroxysmal atrial fibrillation, seizure disorder, diabetes mellitus type 2, remote PE 2010, right bundle branch block, diastolic CHF, hypertension, OHS, hyperlipidemia, hypothyroidism, COPD, fibromyalgia presenting from her dialysis center with elevated heart rate.  Unfortunately, the patient is a poor historian.  She is able to give some general information.  She states she woke up on the morning of 11/22/2019 feeling some palpitations, but denied any dizziness, shortness of breath, or syncope.  She was transported dialysis where she was dialyzed for 1-1/2 hours.  At that time, the patient was noted to have heart rate in the 150s.  The patient was not particular symptomatic.  She denied any chest pain, dizziness, nausea, vomiting, diaphoresis.  She has not had any fevers, chills, vomiting, diarrhea, abdominal pain, dysuria, hematuria. Notably, the patient has noted some redness to her right greater than left leg, but she is unable to tell me how long this has been going on but she is able to state that her legs hurt her.  She denies any recent injury or trauma.  Recent prolonged hospitalization-1/21-2/15, for acute on chronic kidney disease stage V, acute hypoxic and hypercapnic respiratory failure requiring intubation due to altered mental status, secondary to pneumonia, chest CTA negative for PE.  Patient was volume overloaded, with worsening CKD with diuresis.  Hemodialysis was started 2/9.  Also diagnosed with paroxysmal atrial fibrillation during hospitalization, and was discharged on Eliquis 2.5 mg twice daily.  In the emergency department, the patient was noted to have atrial fibrillation with RVR with a heart rate in the 140s.  She subsequently converted back to sinus rhythm but 2 hours later, she went back to atrial  fibrillation.  The patient was started on a diltiazem drip and admitted for further evaluation and treatment.  Assessment/Plan: Paroxysmal atrial fibrillation with RVR -TSH 2.274 -Continue apixaban -Echocardiogram -Transition to oral diltiazem -d/c diltiazem drip  Cellulitis legs -Affecting right leg greater than left -Start vancomycin given the patient's penicillin allergy  ESRD -Nephrology consult -Maintenance dialysis on Tuesday/Thursday/Saturday  Essential hypertension -Continue carvedilol -Holding hydralazine to allow for blood pressure margin for titration of diltiazem  Diabetes mellitus type 2 -09/06/2019 hemoglobin A1c 5.2 -Repeat A1c -NovoLog sliding scale -Holding Trulicity -Reduced dose Lantus  Seizure disorder -Continue Keppra  Hypothyroidism -Continue Synthroid  Depression/anxiety -Continue home dose of Cymbalta, Risperdal, trazodone  Rheumatoid arthritis -Patient is on chronic prednisone  Chronic diastolic CHF -fluid management via HD  Hyperlipidemia -continue statin    Disposition Plan: Patient From: SNF D/C Place: SNF - 1-2   Days Barriers: Not Clinically Stable--IV abx, afib with RVR  Family Communication:  No Family at bedside  Consultants:  renal  Code Status:  FULL   DVT Prophylaxis:  apixaban   Procedures: As Listed in Progress Note Above  Antibiotics: vanco 4/9>>     Subjective: Patient denies fevers, chills, headache, chest pain, dyspnea, nausea, vomiting, diarrhea, abdominal pain, dysuria, hematuria, hematochezia, and melena.   Objective: Vitals:   11/23/19 0630 11/23/19 0645 11/23/19 0700 11/23/19 0800  BP: (!) 150/59 (!) 129/58 (!) 148/54 (!) 127/55  Pulse: 67 64 64 64  Resp: 16 20 16  (!) 23  Temp:    98.6 F (37 C)  TempSrc:    Oral  SpO2: 100% 99% 99% 98%  Weight:  Height:        Intake/Output Summary (Last 24 hours) at 11/23/2019 0849 Last data filed at 11/23/2019 0844 Gross per 24 hour  Intake  664.92 ml  Output --  Net 664.92 ml   Weight change:  Exam:   General:  Pt is alert, follows commands appropriately, not in acute distress  HEENT: No icterus, No thrush, No neck mass, [place]/AT  Cardiovascular: RRR, S1/S2, no rubs, no gallops  Respiratory: bibasilar crackles. No wheeze  Abdomen: Soft/+BS, non tender, non distended, no guarding  Extremities:trace LE edema;  No petechiae, No rashes, no synovitis; erythema R> left leg   Data Reviewed: I have personally reviewed following labs and imaging studies Basic Metabolic Panel: Recent Labs  Lab 11/22/19 1418 11/22/19 1627 11/23/19 0458  NA 135  --  137  K 3.7  --  3.9  CL 99  --  102  CO2 26  --  24  GLUCOSE 211*  --  164*  BUN 36*  --  42*  CREATININE 2.86*  --  3.46*  CALCIUM 8.2*  --  8.4*  MG  --  1.8  --    Liver Function Tests: Recent Labs  Lab 11/22/19 1418  AST 32  ALT 25  ALKPHOS 59  BILITOT 0.7  PROT 5.9*  ALBUMIN 3.1*   No results for input(s): LIPASE, AMYLASE in the last 168 hours. No results for input(s): AMMONIA in the last 168 hours. Coagulation Profile: No results for input(s): INR, PROTIME in the last 168 hours. CBC: Recent Labs  Lab 11/22/19 1418 11/23/19 0458  WBC 7.9 9.8  NEUTROABS 7.0  --   HGB 10.3* 10.0*  HCT 32.1* 32.1*  MCV 103.9* 105.6*  PLT 231 196   Cardiac Enzymes: No results for input(s): CKTOTAL, CKMB, CKMBINDEX, TROPONINI in the last 168 hours. BNP: Invalid input(s): POCBNP CBG: Recent Labs  Lab 11/22/19 2150 11/23/19 0752  GLUCAP 87 135*   HbA1C: No results for input(s): HGBA1C in the last 72 hours. Urine analysis:    Component Value Date/Time   COLORURINE YELLOW 11/22/2019 1449   APPEARANCEUR CLEAR 11/22/2019 1449   LABSPEC 1.009 11/22/2019 1449   PHURINE 7.0 11/22/2019 1449   GLUCOSEU 150 (A) 11/22/2019 1449   HGBUR NEGATIVE 11/22/2019 1449   HGBUR negative 10/20/2009 [place] 11/22/2019 1449   KETONESUR NEGATIVE 11/22/2019  1449   PROTEINUR >=300 (A) 11/22/2019 1449   UROBILINOGEN 0.2 10/05/2011 1002   NITRITE NEGATIVE 11/22/2019 1449   LEUKOCYTESUR TRACE (A) 11/22/2019 1449   Sepsis Labs: @LABRCNTIP (procalcitonin:4,lacticidven:4) ) Recent Results (from the past 240 hour(s))  Culture, blood (Routine X 2) w Reflex to ID Panel     Status: None (Preliminary result)   Collection Time: 11/22/19  2:34 PM   Specimen: Right Antecubital; Blood  Result Value Ref Range Status   Specimen Description RIGHT ANTECUBITAL  Final   Special Requests   Final    BOTTLES DRAWN AEROBIC AND ANAEROBIC Blood Culture adequate volume   Culture   Final    NO GROWTH < 24 HOURS Performed at [facility], [address], [place],  [address]    Report Status PENDING  Incomplete  Culture, blood (Routine X 2) w Reflex to ID Panel     Status: None (Preliminary result)   Collection Time: 11/22/19  2:34 PM   Specimen: BLOOD RIGHT HAND  Result Value Ref Range Status   Specimen Description BLOOD RIGHT HAND  Final   Special Requests  Final    BOTTLES DRAWN AEROBIC AND ANAEROBIC Blood Culture adequate volume   Culture   Final    NO GROWTH < 24 HOURS Performed at [facility], [address] [place], [place] [address]    Report Status PENDING  Incomplete  Respiratory Panel by RT PCR (Flu A&B, Covid) - Nasopharyngeal Swab     Status: None   Collection Time: 11/22/19  7:13 PM   Specimen: Nasopharyngeal Swab  Result Value Ref Range Status   SARS Coronavirus 2 by RT PCR NEGATIVE NEGATIVE Final    Comment: (NOTE) SARS-CoV-2 target nucleic acids are NOT DETECTED. The SARS-CoV-2 RNA is generally detectable in upper respiratoy specimens during the acute phase of infection. The lowest concentration of SARS-CoV-2 viral copies this assay can detect is 131 copies/mL. A negative result does not preclude SARS-Cov-2 infection and should not be used as the sole basis for treatment or other patient management decisions. A negative result may  occur with  improper specimen collection/handling, submission of specimen other than nasopharyngeal swab, presence of viral mutation(s) within the areas targeted by this assay, and inadequate number of viral copies (<131 copies/mL). A negative result must be combined with clinical observations, patient history, and epidemiological information. The expected result is Negative. Fact Sheet for Patients:  PinkCheek.be Fact Sheet for Healthcare Providers:  GravelBags.it This test is not yet ap proved or cleared by the [place] FDA and  has been authorized for detection and/or diagnosis of SARS-CoV-2 by FDA under an Emergency Use Authorization (EUA). This EUA will remain  in effect (meaning this test can be used) for the duration of the COVID-19 declaration under Section 564(b)(1) of the Act, 21 U.S.C. section 360bbb-3(b)(1), unless the authorization is terminated or revoked sooner.    Influenza A by PCR NEGATIVE NEGATIVE Final   Influenza B by PCR NEGATIVE NEGATIVE Final    Comment: (NOTE) The Xpert Xpress SARS-CoV-2/FLU/RSV assay is intended as an aid in  the diagnosis of influenza from Nasopharyngeal swab specimens and  should not be used as a sole basis for treatment. Nasal washings and  aspirates are unacceptable for Xpert Xpress SARS-CoV-2/FLU/RSV  testing. Fact Sheet for Patients: PinkCheek.be Fact Sheet for Healthcare Providers: GravelBags.it This test is not yet approved or cleared by the [place] FDA and  has been authorized for detection and/or diagnosis of SARS-CoV-2 by  FDA under an Emergency Use Authorization (EUA). This EUA will remain  in effect (meaning this test can be used) for the duration of the  Covid-19 declaration under Section 564(b)(1) of the Act, 21  U.S.C. section 360bbb-3(b)(1), unless the authorization is  terminated or  revoked. Performed at [facility], [address], [place], [place] [address]   MRSA PCR Screening     Status: None   Collection Time: 11/22/19  9:14 PM   Specimen: Nasal Mucosa; Nasopharyngeal  Result Value Ref Range Status   MRSA by PCR NEGATIVE NEGATIVE Final    Comment:        The GeneXpert MRSA Assay (FDA approved for NASAL specimens only), is one component of a comprehensive MRSA colonization surveillance program. It is not intended to diagnose MRSA infection nor to guide or monitor treatment for MRSA infections. Performed at [facility], [address], [place], [place] [address]      Scheduled Meds: . apixaban  5 mg Oral BID  . atorvastatin  20 mg Oral QHS  . carvedilol  25 mg Oral BID WC  . Chlorhexidine Gluconate Cloth  6 each Topical Daily  . DULoxetine  60 mg Oral Daily  . gabapentin  100 mg Oral BID  . insulin aspart  0-5 Units Subcutaneous QHS  . insulin aspart  0-9 Units Subcutaneous TID WC  . insulin glargine  18 Units Subcutaneous QHS  . levETIRAcetam  250 mg Oral BID  . levothyroxine  50 mcg Oral QAC breakfast  . montelukast  10 mg Oral QHS  . pantoprazole  40 mg Oral Daily  . predniSONE  10 mg Oral QHS  . primidone  50 mg Oral q morning - 10a  . risperiDONE  0.5 mg Oral BID  . traZODone  50 mg Oral QHS   Continuous Infusions: . diltiazem (CARDIZEM) infusion 2.5 mg/hr (11/23/19 0844)    Procedures/Studies: DG Chest Port 1 View  Result Date: 11/22/2019 CLINICAL DATA:  Tachycardia. EXAM: PORTABLE CHEST 1 VIEW COMPARISON:  10/21/2019 and 09/27/2019 chest radiographs. FINDINGS: Tunneled left IJ central venous catheter tip at the superior cavoatrial junction. Minimal patchy left basilar opacities likely reflect atelectasis. No pneumothorax or pleural effusion. Decreased conspicuity of pulmonary vascular congestion and right hemidiaphragm elevation. Grossly unchanged cardiomegaly, accentuated by AP technique and mild lung hypoinflation. IMPRESSION: Decreased  conspicuity of pulmonary vascular congestion. Left basilar atelectasis. Stable cardiomegaly. Electronically Signed   By: Primitivo Gauze M.D.   On: 11/22/2019 14:53   VAS US DUPLEX DIALYSIS ACCESS (AVF, AVG)  Result Date: 11/02/2019 DIALYSIS ACCESS Reason for Exam: Routine follow up. Access Site: Right Upper Extremity. Access Type: Brachial-cephalic AVF. History: Placed 09/27/19. Performing Technologist: Ralene Cork RVT  Examination Guidelines: A complete evaluation includes B-mode imaging, spectral Doppler, color Doppler, and power Doppler as needed of all accessible portions of each vessel. Unilateral testing is considered an integral part of a complete examination. Limited examinations for reoccurring indications may be performed as noted.  Findings: +--------------------+----------+-----------------+--------+ AVF                 PSV (cm/s)Flow Vol (mL/min)Comments +--------------------+----------+-----------------+--------+ Native artery inflow   198          1006                +--------------------+----------+-----------------+--------+ AVF Anastomosis        434                              +--------------------+----------+-----------------+--------+  +------------+----------+-------------+----------+--------+ OUTFLOW VEINPSV (cm/s)Diameter (cm)Depth (cm)Describe +------------+----------+-------------+----------+--------+ Prox UA        159        0.58        1.54            +------------+----------+-------------+----------+--------+ Mid UA         141        0.68        0.45            +------------+----------+-------------+----------+--------+ Dist UA        189        0.63        0.50            +------------+----------+-------------+----------+--------+ AC Fossa       537     1.00/0.475  0.74/0.535         +------------+----------+-------------+----------+--------+   Summary: 1. Patent AVF. 2. Change in vessel diameter in the ante cubitum. 3. No  significant branching.  *See table(s) above for measurements and observations.  Diagnosing physician: Servando Snare MD Electronically signed by Servando Snare MD  on 11/02/2019 at 11:46:00 AM.    --------------------------------------------------------------------------------   Final     Orson Eva, DO  [facility]  If 7PM-7AM, please contact night-coverage [email or web address] Password [email or web address] 11/23/2019, 8:49 AM   LOS: 1 day

## 2019-11-23 NOTE — Plan of Care (Signed)
  Problem: Acute Rehab PT Goals(only PT should resolve) Goal: Pt Will Go Supine/Side To Sit Outcome: Progressing Flowsheets (Taken 11/23/2019 1543) Pt will go Supine/Side to Sit: with minimal assist Goal: Pt Will Go Sit To Supine/Side Outcome: Progressing Flowsheets (Taken 11/23/2019 1543) Pt will go Sit to Supine/Side: with minimal assist Goal: Patient Will Transfer Sit To/From Stand Outcome: Progressing Flowsheets (Taken 11/23/2019 1543) Patient will transfer sit to/from stand: with min guard assist Goal: Pt Will Transfer Bed To Chair/Chair To Bed Outcome: Progressing Flowsheets (Taken 11/23/2019 1543) Pt will Transfer Bed to Chair/Chair to Bed: with min assist Goal: Pt Will Ambulate Outcome: Progressing Flowsheets (Taken 11/23/2019 1543) Pt will Ambulate:  25 feet  with minimal assist  with least restrictive assistive device  3:44 PM, 11/23/19 Mearl Latin PT, DPT Physical Therapist at [facility]

## 2019-11-23 NOTE — Progress Notes (Signed)
*  PRELIMINARY RESULTS* Echocardiogram 2D Echocardiogram has been performed.  Leavy Cella 11/23/2019, 2:02 PM

## 2019-11-23 NOTE — Consult Note (Signed)
Reason for Consult: To manage dialysis and dialysis related needs Referring Physician: Tat  Imogean Cross is an 69 y.o. female with past medical history significant for schizophrenia, diabetes mellitus, hypertension, diastolic CHF complicated by atrial fibrillation, hyperlipidemia, COPD, hypothyroidism.  She resides at [facility]. She had CKD and was initiated on dialysis on 09/25/2019.  Currently dialyzing at [facility] unit on TTS schedule.  She presented to dialysis yesterday and was noted to have slightly elevated heart rate to start.  During the course of dialysis-her HR increased more until she eventually was taken off and sent to the hospital.  She received about 1.5 hours of her 4 hour treatment.  She was in Afib with RVR-  Started on diltiazem drip and admitted to ICU.  There is also mention of LE cellulitis.  Cards has seen-  Considering ischemic work up and maybe ablation   Dialyzes at [facility] 87.5. HD Bath 2/2.5, Dialyzer unknown, Heparin yes- 1500 per tx. Access TDC. S/p AVF placed 09/27/19  epo 10,000 units TIW  Past Medical History:  Diagnosis Date  . Anxiety   . Asthma   . Back pain   . Barrett esophagus   . CHF (congestive heart failure) ([place])   . CKD (chronic kidney disease) stage 3, GFR 30-59 ml/min   . COPD (chronic obstructive pulmonary disease) ([place])   . Depression   . Diastolic heart failure ([place])   . Essential hypertension   . Fibromyalgia   . GERD (gastroesophageal reflux disease)   . Gout   . History of pericarditis   . Hyperlipidemia   . Hypothyroid   . Major depressive disorder   . Memory loss   . Nephrolithiasis    2008  . Obesity hypoventilation syndrome (HCC)    CPAP  . Obstructive sleep apnea   . PE (pulmonary embolism)    2010  . Peripheral neuropathy   . PTSD (post-traumatic stress disorder)   . Rheumatoid arthritis ([place])   . Schizophrenia ([place])   . 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 Seizures ([place])   .  Steroid dependence ([place])   . Type 2 diabetes mellitus ([place])   . Vitamin D deficiency     Past Surgical History:  Procedure Laterality Date  . APPENDECTOMY    . AV FISTULA PLACEMENT Right 09/27/2019   Procedure: ARTERIOVENOUS (AV) FISTULA CREATION;  Surgeon: Waynetta Sandy, MD;  Location: [facility];  Service: Vascular;  Laterality: Right;  . CHOLECYSTECTOMY    . COLONOSCOPY     benign polyps (12/2000), repeat colonoscopy performed in 2007  . ENDOMETRIAL BIOPSY     10/03 benign columnar mucosa (limited material)  . INSERTION OF DIALYSIS CATHETER Right 09/14/2019   Procedure: ATTEMPTED INSERTION OF DIALYSIS CATHETER;  Surgeon: Virl Cagey, MD;  Location: [facility];  Service: General;  Laterality: Right;  . INSERTION OF DIALYSIS CATHETER Right 09/27/2019   Procedure: INSERTION OF DIALYSIS CATHETER;  Surgeon: Waynetta Sandy, MD;  Location: [facility];  Service: Vascular;  Laterality: Right;  . KIDNEY SURGERY      Family History  Problem Relation Age of Onset  . Bone cancer Mother   . Hypertension Mother   . Heart disease Mother   . COPD Father   . Heart disease Father   . Stroke Father     Social History:  reports that she quit smoking about 47 years ago. Her smoking use included cigarettes. She has never used smokeless tobacco.  She reports that she does not drink alcohol or use drugs.  Allergies:  Allergies  Allergen Reactions  . Benztropine Mesylate Other (See Comments)    Alopecia and white spots on eyes  . Codeine Swelling, Palpitations and Other (See Comments)    Mouth Swelling and Tachycardia   . Diazepam Other (See Comments)    COMA  . Diphenhydramine Hcl Anxiety, Palpitations and Other (See Comments)    Tachycardia and anxiousness  . Penicillins Swelling and Other (See Comments)    REACTION: ulcers in mouth, swelling, throat swelling  Has patient had a PCN reaction causing immediate rash, facial/tongue/throat swelling, SOB or lightheadedness with  hypotension: YES Has patient had a PCN reaction causing severe rash involving Mucus membranes or skin necrosis: Unknown Has patient had a PCN reaction that required hospitalization Unknown Has patient had a PCN reaction occurring within the last 10 years: unknown If all of the above answers are "NO", then may proceed with Cephalospori  . Sulfonamide Derivatives Rash    Blisters, also  . Thioridazine Hcl Swelling  . Lisinopril Cough    Medications: I have reviewed the patient's current medications.   Results for orders placed or performed during the hospital encounter of 11/22/19 (from the past 48 hour(s))  Comprehensive metabolic panel     Status: Abnormal   Collection Time: 11/22/19  2:18 PM  Result Value Ref Range   Sodium 135 135 - 145 mmol/L   Potassium 3.7 3.5 - 5.1 mmol/L   Chloride 99 98 - 111 mmol/L   CO2 26 22 - 32 mmol/L   Glucose, Bld 211 (H) 70 - 99 mg/dL    Comment: Glucose reference range applies only to samples taken after fasting for at least 8 hours.   BUN 36 (H) 8 - 23 mg/dL   Creatinine, Ser 2.86 (H) 0.44 - 1.00 mg/dL   Calcium 8.2 (L) 8.9 - 10.3 mg/dL   Total Protein 5.9 (L) 6.5 - 8.1 g/dL   Albumin 3.1 (L) 3.5 - 5.0 g/dL   AST 32 15 - 41 U/L   ALT 25 0 - 44 U/L   Alkaline Phosphatase 59 38 - 126 U/L   Total Bilirubin 0.7 0.3 - 1.2 mg/dL   GFR calc non Af Amer 16 (L) >60 mL/min   GFR calc Af Amer 19 (L) >60 mL/min   Anion gap 10 5 - 15    Comment: Performed at [facility], [address] [place], [place] [address]  CBC with Differential/Platelet     Status: Abnormal   Collection Time: 11/22/19  2:18 PM  Result Value Ref Range   WBC 7.9 4.0 - 10.5 K/uL   RBC 3.09 (L) 3.87 - 5.11 MIL/uL   Hemoglobin 10.3 (L) 12.0 - 15.0 g/dL   HCT 32.1 (L) 36.0 - 46.0 %   MCV 103.9 (H) 80.0 - 100.0 fL   MCH 33.3 26.0 - 34.0 pg   MCHC 32.1 30.0 - 36.0 g/dL   RDW 16.6 (H) 11.5 - 15.5 %   Platelets 231 150 - 400 K/uL   nRBC 0.0 0.0 - 0.2 %   Neutrophils Relative % 89  %   Neutro Abs 7.0 1.7 - 7.7 K/uL   Lymphocytes Relative 5 %   Lymphs Abs 0.4 (L) 0.7 - 4.0 K/uL   Monocytes Relative 5 %   Monocytes Absolute 0.4 0.1 - 1.0 K/uL   Eosinophils Relative 0 %   Eosinophils Absolute 0.0 0.0 - 0.5 K/uL   Basophils Relative 0 %  Basophils Absolute 0.0 0.0 - 0.1 K/uL   Immature Granulocytes 1 %   Abs Immature Granulocytes 0.04 0.00 - 0.07 K/uL    Comment: Performed at [facility], [address], [place], [place] [address]  Brain natriuretic peptide     Status: Abnormal   Collection Time: 11/22/19  2:18 PM  Result Value Ref Range   B Natriuretic Peptide 1,131.0 (H) 0.0 - 100.0 pg/mL    Comment: Performed at [facility], [address] [place], [place] [address]  Culture, blood (Routine X 2) w Reflex to ID Panel     Status: None (Preliminary result)   Collection Time: 11/22/19  2:34 PM   Specimen: Right Antecubital; Blood  Result Value Ref Range   Specimen Description RIGHT ANTECUBITAL    Special Requests      BOTTLES DRAWN AEROBIC AND ANAEROBIC Blood Culture adequate volume   Culture      NO GROWTH < 24 HOURS Performed at [facility], [address] [place], [place] [address]    Report Status PENDING   Culture, blood (Routine X 2) w Reflex to ID Panel     Status: None (Preliminary result)   Collection Time: 11/22/19  2:34 PM   Specimen: BLOOD RIGHT HAND  Result Value Ref Range   Specimen Description BLOOD RIGHT HAND    Special Requests      BOTTLES DRAWN AEROBIC AND ANAEROBIC Blood Culture adequate volume   Culture      NO GROWTH < 24 HOURS Performed at [facility], [address] [place], [place] [address]    Report Status PENDING   Lactic acid, plasma     Status: None   Collection Time: 11/22/19  2:34 PM  Result Value Ref Range   Lactic Acid, Venous 1.4 0.5 - 1.9 mmol/L    Comment: Performed at [facility], [address], [place], [place] [address]  Troponin I (High Sensitivity)     Status: Abnormal   Collection Time: 11/22/19  2:34 PM   Result Value Ref Range   Troponin I (High Sensitivity) 40 (H) <18 ng/L    Comment: (NOTE) Elevated high sensitivity troponin I (hsTnI) values and significant  changes across serial measurements may suggest ACS but many other  chronic and acute conditions are known to elevate hsTnI results.  Refer to the "Links" section for chest pain algorithms and additional  guidance. Performed at [facility], [address] [place], [place] [address]   Urinalysis, Routine w reflex microscopic     Status: Abnormal   Collection Time: 11/22/19  2:49 PM  Result Value Ref Range   Color, Urine YELLOW YELLOW   APPearance CLEAR CLEAR   Specific Gravity, Urine 1.009 1.005 - 1.030   pH 7.0 5.0 - 8.0   Glucose, UA 150 (A) NEGATIVE mg/dL   Hgb urine dipstick NEGATIVE NEGATIVE   Bilirubin Urine NEGATIVE NEGATIVE   Ketones, ur NEGATIVE NEGATIVE mg/dL   Protein, ur >=300 (A) NEGATIVE mg/dL   Nitrite NEGATIVE NEGATIVE   Leukocytes,Ua TRACE (A) NEGATIVE   RBC / HPF 0-5 0 - 5 RBC/hpf   WBC, UA 11-20 0 - 5 WBC/hpf   Bacteria, UA RARE (A) NONE SEEN   Squamous Epithelial / LPF 0-5 0 - 5    Comment: Performed at [facility], [address] [place], [place] [address]  Troponin I (High Sensitivity)     Status: Abnormal   Collection Time: 11/22/19  4:27 PM  Result Value Ref Range   Troponin I (  High Sensitivity) 81 (H) <18 ng/L    Comment: DELTA CHECK NOTED (NOTE) Elevated high sensitivity troponin I (hsTnI) values and significant  changes across serial measurements may suggest ACS but many other  chronic and acute conditions are known to elevate hsTnI results.  Refer to the Links section for chest pain algorithms and additional  guidance. Performed at [facility], [address], [place], [place] [address]   TSH     Status: None   Collection Time: 11/22/19  4:27 PM  Result Value Ref Range   TSH 2.274 0.350 - 4.500 uIU/mL    Comment: Performed by a 3rd Generation assay with a functional sensitivity of  <=0.01 uIU/mL. Performed at [facility], [address], [place], [place] [address]   Magnesium     Status: None   Collection Time: 11/22/19  4:27 PM  Result Value Ref Range   Magnesium 1.8 1.7 - 2.4 mg/dL    Comment: Performed at [facility], [address], [place], [place] [address]  Troponin I (High Sensitivity)     Status: Abnormal   Collection Time: 11/22/19  6:43 PM  Result Value Ref Range   Troponin I (High Sensitivity) 181 (HH) <18 ng/L    Comment: CRITICAL RESULT CALLED TO, READ BACK BY AND VERIFIED WITH: NICHOLS,K ON 11/22/19 AT 1945 BY LOY,C (NOTE) Elevated high sensitivity troponin I (hsTnI) values and significant  changes across serial measurements may suggest ACS but many other  chronic and acute conditions are known to elevate hsTnI results.  Refer to the Links section for chest pain algorithms and additional  guidance. Performed at [facility], [address] [place], [place] [address]   Respiratory Panel by RT PCR (Flu A&B, Covid) - Nasopharyngeal Swab     Status: None   Collection Time: 11/22/19  7:13 PM   Specimen: Nasopharyngeal Swab  Result Value Ref Range   SARS Coronavirus 2 by RT PCR NEGATIVE NEGATIVE    Comment: (NOTE) SARS-CoV-2 target nucleic acids are NOT DETECTED. The SARS-CoV-2 RNA is generally detectable in upper respiratoy specimens during the acute phase of infection. The lowest concentration of SARS-CoV-2 viral copies this assay can detect is 131 copies/mL. A negative result does not preclude SARS-Cov-2 infection and should not be used as the sole basis for treatment or other patient management decisions. A negative result may occur with  improper specimen collection/handling, submission of specimen other than nasopharyngeal swab, presence of viral mutation(s) within the areas targeted by this assay, and inadequate number of viral copies (<131 copies/mL). A negative result must be combined with clinical observations, patient history, and  epidemiological information. The expected result is Negative. Fact Sheet for Patients:  PinkCheek.be Fact Sheet for Healthcare Providers:  GravelBags.it This test is not yet ap proved or cleared by the [place] FDA and  has been authorized for detection and/or diagnosis of SARS-CoV-2 by FDA under an Emergency Use Authorization (EUA). This EUA will remain  in effect (meaning this test can be used) for the duration of the COVID-19 declaration under Section 564(b)(1) of the Act, 21 U.S.C. section 360bbb-3(b)(1), unless the authorization is terminated or revoked sooner.    Influenza A by PCR NEGATIVE NEGATIVE   Influenza B by PCR NEGATIVE NEGATIVE    Comment: (NOTE) The Xpert Xpress SARS-CoV-2/FLU/RSV assay is intended as an aid in  the diagnosis of influenza from Nasopharyngeal swab specimens and  should not be used as a sole basis for treatment. Nasal washings and  aspirates are unacceptable  for Xpert Xpress SARS-CoV-2/FLU/RSV  testing. Fact Sheet for Patients: PinkCheek.be Fact Sheet for Healthcare Providers: GravelBags.it This test is not yet approved or cleared by the [place] FDA and  has been authorized for detection and/or diagnosis of SARS-CoV-2 by  FDA under an Emergency Use Authorization (EUA). This EUA will remain  in effect (meaning this test can be used) for the duration of the  Covid-19 declaration under Section 564(b)(1) of the Act, 21  U.S.C. section 360bbb-3(b)(1), unless the authorization is  terminated or revoked. Performed at [facility], [address], [place], [place] [address]   MRSA PCR Screening     Status: None   Collection Time: 11/22/19  9:14 PM   Specimen: Nasal Mucosa; Nasopharyngeal  Result Value Ref Range   MRSA by PCR NEGATIVE NEGATIVE    Comment:        The GeneXpert MRSA Assay (FDA approved for NASAL specimens only), is  one component of a comprehensive MRSA colonization surveillance program. It is not intended to diagnose MRSA infection nor to guide or monitor treatment for MRSA infections. Performed at [facility], [address], [place], [place] [address]   Troponin I (High Sensitivity)     Status: Abnormal   Collection Time: 11/22/19  9:37 PM  Result Value Ref Range   Troponin I (High Sensitivity) 380 (HH) <18 ng/L    Comment: CRITICAL RESULT CALLED TO, READ BACK BY AND VERIFIED WITH: AMBURN,A ON 11/22/19 AT 2305 BY LOY,C (NOTE) Elevated high sensitivity troponin I (hsTnI) values and significant  changes across serial measurements may suggest ACS but many other  chronic and acute conditions are known to elevate hsTnI results.  Refer to the Links section for chest pain algorithms and additional  guidance. Performed at [facility], [address] [place], [place] [address]   Glucose, capillary     Status: None   Collection Time: 11/22/19  9:50 PM  Result Value Ref Range   Glucose-Capillary 87 70 - 99 mg/dL    Comment: Glucose reference range applies only to samples taken after fasting for at least 8 hours.  Basic metabolic panel     Status: Abnormal   Collection Time: 11/23/19  4:58 AM  Result Value Ref Range   Sodium 137 135 - 145 mmol/L   Potassium 3.9 3.5 - 5.1 mmol/L   Chloride 102 98 - 111 mmol/L   CO2 24 22 - 32 mmol/L   Glucose, Bld 164 (H) 70 - 99 mg/dL    Comment: Glucose reference range applies only to samples taken after fasting for at least 8 hours.   BUN 42 (H) 8 - 23 mg/dL   Creatinine, Ser 3.46 (H) 0.44 - 1.00 mg/dL   Calcium 8.4 (L) 8.9 - 10.3 mg/dL   GFR calc non Af Amer 13 (L) >60 mL/min   GFR calc Af Amer 15 (L) >60 mL/min   Anion gap 11 5 - 15    Comment: Performed at [facility], [address] [place], [place] [address]  CBC     Status: Abnormal   Collection Time: 11/23/19  4:58 AM  Result Value Ref Range   WBC 9.8 4.0 - 10.5 K/uL   RBC 3.04 (L) 3.87 - 5.11  MIL/uL   Hemoglobin 10.0 (L) 12.0 - 15.0 g/dL   HCT 32.1 (L) 36.0 - 46.0 %   MCV 105.6 (H) 80.0 - 100.0 fL   MCH 32.9 26.0 - 34.0 pg   MCHC 31.2 30.0 - 36.0 g/dL  RDW 17.0 (H) 11.5 - 15.5 %   Platelets 196 150 - 400 K/uL   nRBC 0.0 0.0 - 0.2 %    Comment: Performed at [facility], [address] [place], [place] [address]  Glucose, capillary     Status: Abnormal   Collection Time: 11/23/19  7:52 AM  Result Value Ref Range   Glucose-Capillary 135 (H) 70 - 99 mg/dL    Comment: Glucose reference range applies only to samples taken after fasting for at least 8 hours.    DG Chest Port 1 View  Result Date: 11/22/2019 CLINICAL DATA:  Tachycardia. EXAM: PORTABLE CHEST 1 VIEW COMPARISON:  10/21/2019 and 09/27/2019 chest radiographs. FINDINGS: Tunneled left IJ central venous catheter tip at the superior cavoatrial junction. Minimal patchy left basilar opacities likely reflect atelectasis. No pneumothorax or pleural effusion. Decreased conspicuity of pulmonary vascular congestion and right hemidiaphragm elevation. Grossly unchanged cardiomegaly, accentuated by AP technique and mild lung hypoinflation. IMPRESSION: Decreased conspicuity of pulmonary vascular congestion. Left basilar atelectasis. Stable cardiomegaly. Electronically Signed   By: Primitivo Gauze M.D.   On: 11/22/2019 14:53    ROS: really negative  Blood pressure (!) 127/55, pulse 64, temperature 98.6 F (37 C), temperature source Oral, resp. rate (!) 23, height 5' (1.524 m), weight 87.7 kg, SpO2 98 %. General appearance: alert, cooperative and simple, one word answers, obese Resp: diminished breath sounds bibasilar Cardio: irregularly irregular rhythm GI: soft, non-tender; bowel sounds normal; no masses,  no organomegaly Extremities: edema pitting to dep areas left TDC-  non tender-  right upper arm AVF maturing with good thrill and bruit   Some mild erythema to bilat LE's  Assessment/Plan: 69 year old WF-  May medical issues  to include fairly new ESRD.  Admitted with Afib/RVR 1 Afib with RVR-  Currently controlled on diltiazem drip- being converted to diltiazem PO-  Also on coreg 25 BID and eliquis. Cards considering possible ablation and possible ischemic work up given elevated troponin 2 ESRD: currently TTS at [facility]-  Shortened treatment on Thursday.  NO acute needs today for HD, will plan on regular treatment tomorrow- will run on 3 k bath - use TDC 3 Hypertension/vol: appears overloaded by exam and BNP-  Will attempt to challenge with HD tomorrow but the coreg and diltiazem may limit UF 4. Anemia of ESRD: normally on big dose ESA - will continue here.  Will check iron as well  5. Metabolic Bone Disease: no vitamin D or sensipar with HD- no vit D on med list - will check numbers and treat as needed 6.  LE cellulitis-  On vanc per primary   Will arrange routine HD on Saturday.  Will not plan to physically see over the weekend, call with questions - will revisit on Monday    [place] 11/23/2019, 9:52 AM

## 2019-11-23 NOTE — Evaluation (Signed)
Physical Therapy Evaluation Patient Details Name: [name] MRN: [id number] DOB: 1951-06-20 Today's Date: 11/23/2019   History of Present Illness  [name] is a 69 y.o. female with medical history significant for diabetes mellitus, schizophrenia, GERD, depression, hypertension, diastolic CHF, ESRD, paroxysmal atrial fibrillation, seizure disorder and pulmonary embolism.Patient was brought to the ED with reports of elevated heart rate.  Patient tells me she was aware of this, this morning, felt like her heart was beating very fast.  She went for dialysis, she tells me during dialysis her heart rate continued to increase.  Patient was sent to the ED.  Per triage notes, patient had 1.5 hours of her 4 hours of HD today.  She still makes urine.  She denies difficulty breathing, no chest pain.    Clinical Impression  Patient limited for functional mobility as stated below secondary to BLE weakness, fatigue and poor standing balance. Patient requires min/mod assist to transition to seated EOB along with verbal cueing to scoot forward to improve balance seated EOB. She requires min assist to transfer to standing with use of RW and assist for balance and safety upon standing. She is able to take several lateral steps at bedside which are very slow and labored. Patient is returned to bed and RN assists with bed mobility to get patient positioned in bed. Patient will benefit from continued physical therapy in hospital and recommended venue below to increase strength, balance, endurance for safe ADLs and gait.     Follow Up Recommendations SNF;Supervision/Assistance - 24 hour;Supervision for mobility/OOB    Equipment Recommendations  None recommended by PT    Recommendations for Other Services       Precautions / Restrictions Precautions Precautions: Fall Restrictions Weight Bearing Restrictions: No      Mobility  Bed Mobility Overal bed mobility: Needs Assistance Bed Mobility: Supine to  Sit;Sit to Supine     Supine to sit: Min assist;Mod assist Sit to supine: Min assist;Mod assist   General bed mobility comments: physical assist to transition to seated EOB and return to supine, verbal cueing required to scoot to edge of bed  Transfers Overall transfer level: Needs assistance Equipment used: Rolling walker (2 wheeled) Transfers: Sit to/from Stand Sit to Stand: Min assist;From elevated surface         General transfer comment: to tranfer to standing with use of RW  Ambulation/Gait Ambulation/Gait assistance: Min assist;Mod assist Gait Distance (Feet): 3 Feet Assistive device: Rolling walker (2 wheeled)   Gait velocity: decreased   General Gait Details: slow labored lateral steps at bedside  Stairs            Wheelchair Mobility    Modified Rankin (Stroke Patients Only)       Balance Overall balance assessment: Needs assistance Sitting-balance support: Bilateral upper extremity supported Sitting balance-Leahy Scale: Good Sitting balance - Comments: seated EOB     Standing balance-Leahy Scale: Poor Standing balance comment: fair/poor, requires RW                             Pertinent Vitals/Pain Pain Assessment: No/denies pain    Home Living Family/patient expects to be discharged to:: Skilled nursing facility                 Additional Comments: Patient reports she was at [facility]    Prior Function Level of Independence: Needs assistance   Gait / Transfers Assistance Needed: Patient states she was ambulating  with rollator  ADL's / Homemaking Assistance Needed: Assisted by staff        Hand Dominance        Extremity/Trunk Assessment   Upper Extremity Assessment Upper Extremity Assessment: Generalized weakness    Lower Extremity Assessment Lower Extremity Assessment: Generalized weakness       Communication      Cognition Arousal/Alertness: Awake/alert Behavior During Therapy: WFL for tasks  assessed/performed;Flat affect Overall Cognitive Status: No family/caregiver present to determine baseline cognitive functioning                                 General Comments: slow and limited responses      General Comments      Exercises     Assessment/Plan    PT Assessment Patient needs continued PT services  PT Problem List Decreased strength;Decreased activity tolerance;Decreased balance;Decreased mobility;Decreased knowledge of use of DME       PT Treatment Interventions DME instruction;Balance training;Gait training;Neuromuscular re-education;Functional mobility training;Patient/family education;Stair training;Therapeutic activities;Therapeutic exercise    PT Goals (Current goals can be found in the Care Plan section)  Acute Rehab PT Goals Patient Stated Goal: Return to rehab PT Goal Formulation: With patient Time For Goal Achievement: 12/07/19 Potential to Achieve Goals: Fair    Frequency Min 3X/week   Barriers to discharge        Co-evaluation               AM-PAC PT "6 Clicks" Mobility  Outcome Measure Help needed turning from your back to your side while in a flat bed without using bedrails?: A Little Help needed moving from lying on your back to sitting on the side of a flat bed without using bedrails?: A Little Help needed moving to and from a bed to a chair (including a wheelchair)?: A Lot Help needed standing up from a chair using your arms (e.g., wheelchair or bedside chair)?: A Lot Help needed to walk in hospital room?: A Lot Help needed climbing 3-5 steps with a railing? : A Lot 6 Click Score: 14    End of Session Equipment Utilized During Treatment: Gait belt;Oxygen Activity Tolerance: Patient tolerated treatment well;Patient limited by fatigue Patient left: in bed;with call bell/phone within reach Nurse Communication: Mobility status PT Visit Diagnosis: Unsteadiness on feet (R26.81);Other abnormalities of gait and mobility  (R26.89);Muscle weakness (generalized) (M62.81)    Time: 1443-1540 PT Time Calculation (min) (ACUTE ONLY): 17 min   Charges:   PT Evaluation $PT Eval Moderate Complexity: 1 Mod         3:42 PM, 11/23/19 Mearl Latin PT, DPT Physical Therapist at [facility]

## 2019-11-23 NOTE — Progress Notes (Addendum)
Pharmacy Antibiotic Note  [name] is a 69 y.o. female admitted on 11/22/2019 with left leg cellulitis.  Pharmacy has been consulted for vancomycin  dosing due to her penicillin allergy.  Patient receives  dialysis on TTS schedule.  Plan: Loading dose:  vancomycin 1.75g  IV x1 dose Maintenance dose:   vancomycin 1g IV  After each HD session Goal vancomycin trough range:  10-15 mcg/mL Pharmacy will continue to monitor renal function, vancomycin troughs as clinically appropriate,  cultures and patient progress.   Height: 5' (152.4 cm) Weight: 87.7 kg (193 lb 5.5 oz) IBW/kg (Calculated) : 45.5  Temp (24hrs), Avg:98.7 F (37.1 C), Min:98.5 F (36.9 C), Max:98.9 F (37.2 C)  Recent Labs  Lab 11/22/19 1418 11/22/19 1434 11/23/19 0458  WBC 7.9  --  9.8  CREATININE 2.86*  --  3.46*  LATICACIDVEN  --  1.4  --     Estimated Creatinine Clearance: 15.3 mL/min (A) (by C-G formula based on SCr of 3.46 mg/dL (H)).    Allergies  Allergen Reactions  . Benztropine Mesylate Other (See Comments)    Alopecia and white spots on eyes  . Codeine Swelling, Palpitations and Other (See Comments)    Mouth Swelling and Tachycardia   . Diazepam Other (See Comments)    COMA  . Diphenhydramine Hcl Anxiety, Palpitations and Other (See Comments)    Tachycardia and anxiousness  . Penicillins Swelling and Other (See Comments)    REACTION: ulcers in mouth, swelling, throat swelling  Has patient had a PCN reaction causing immediate rash, facial/tongue/throat swelling, SOB or lightheadedness with hypotension: YES Has patient had a PCN reaction causing severe rash involving Mucus membranes or skin necrosis: Unknown Has patient had a PCN reaction that required hospitalization Unknown Has patient had a PCN reaction occurring within the last 10 years: unknown If all of the above answers are "NO", then may proceed with Cephalospori  . Sulfonamide Derivatives Rash    Blisters, also  . Thioridazine Hcl  Swelling  . Lisinopril Cough    Antimicrobials this admission: vancomycin 4/9 >>    Dose adjustments this admission: vancomycin  Microbiology results: 4/8 [facility] x2:   4/9 UCx:  collected  4/8 Resp PCR: SARS CoV-2 negative; Flu A/B negative 4/8  MRSA PCR: neg  Thank you for allowing pharmacy to be a part of this patient's care.  Despina Pole 11/23/2019 4:18 PM

## 2019-11-23 NOTE — Consult Note (Addendum)
Cardiology Consultation:   Patient ID: [name] MRN: [id number]; DOB: [birth year]  Admit date: 11/22/2019 Date of Consult: 11/23/2019  Primary Care Provider: Glenda Chroman, MD Primary Cardiologist: Rozann Lesches, MD  Primary Electrophysiologist:  None    Patient Profile:   [name] is a 69 y.o. female with a hx of chronic diastolic HF, HTN, HLD, COPD, DM-2, hx of PE, seizure disorder, Schizophrenia and ESRD  who is being seen today for the evaluation of atrial fib with RVR at the request of Dr. Carles Collet.  History of Present Illness:   [name] and no family and no guardian with above hx and last Echo with normal EF in 2019 and severely dilated LA size, mild TR , on chest CT 08/2019 she was noted to have marked severity coronary artery calcification.  Pl effusions at that time.  Also  Moderate severity left upper lobe and right lower lobe atelectasis and/or infiltrate with marked severity consolidation, seen throughout the entire right lower lobe.  She began dialysis 09/25/19 -with that admit she had acute resp. Failure on vent. + PNA She uses BiPap at night. She was on eliquis for new PAF on that admit.  10/21/19 was in [place]   Now admitted with racing HR.  She had rec'd her second COVID vaccine on day of admit.   She noted "fluttering" in her chest that did not go away.  So she came to the hospital.  No chest pain and no SOB.  In dialysis she has similar feelings with her dialysis.  She does not know what her BP runs at dialysis.   EKG:  The EKG was personally reviewed and demonstrates:  #1 @ 1359  SVT at 149 RBBB and LAFB, LVH, could be a flutter vs ST  #2 @ 9562 SR with freq PACs RBBB LAFB  #3 @ 1811  SVT  same as the first #4 @1846  SR with freq PVCs and RBBB, LAFB  Looking back to Jan 2021 she had similar EKG with SVT vs flutter. Her RBBB and LAFB with LVH ar old.     Telemetry:  Telemetry was personally reviewed and demonstrates:  SR with PVCs and PACs   BNP 1131  Hs Troponin  40;81;181;380  Na 135, K+ 3.7 glucose 211 BUN 36 Cr 2.86  Hgb 10.3 WBC 7.9  Plts 231  TSH 2.274  PCXR  IMPRESSION: Decreased conspicuity of pulmonary vascular congestion. Left basilar atelectasis. Stable cardiomegaly.  Currently BP 127/55 to 132/88 P 74 R 16  No chest pain or SOB this AM On coreg 25 BID as outpt  Currently IV dilt at 2.5   Past Medical History:  Diagnosis Date  . Anxiety   . Asthma   . Back pain   . Barrett esophagus   . CHF (congestive heart failure) ([place])   . CKD (chronic kidney disease) stage 3, GFR 30-59 ml/min   . COPD (chronic obstructive pulmonary disease) ([place])   . Depression   . Diastolic heart failure ([place])   . Essential hypertension   . Fibromyalgia   . GERD (gastroesophageal reflux disease)   . Gout   . History of pericarditis   . Hyperlipidemia   . Hypothyroid   . Major depressive disorder   . Memory loss   . Nephrolithiasis    2008  . Obesity hypoventilation syndrome (H[facility])    CPAP  . Obstructive sleep apnea   . PE (pulmonary embolism)    2010  . Peripheral neuropathy   .  PTSD (post-traumatic stress disorder)   . Rheumatoid arthritis ([place])   . Schizophrenia ([place])   . 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 Seizures ([place])   . Steroid dependence ([place])   . Type 2 diabetes mellitus ([place])   . Vitamin D deficiency     Past Surgical History:  Procedure Laterality Date  . APPENDECTOMY    . AV FISTULA PLACEMENT Right 09/27/2019   Procedure: ARTERIOVENOUS (AV) FISTULA CREATION;  Surgeon: Waynetta Sandy, MD;  Location: [facility];  Service: Vascular;  Laterality: Right;  . CHOLECYSTECTOMY    . COLONOSCOPY     benign polyps (12/2000), repeat colonoscopy performed in 2007  . ENDOMETRIAL BIOPSY     10/03 benign columnar mucosa (limited material)  . INSERTION OF DIALYSIS CATHETER Right 09/14/2019   Procedure: ATTEMPTED INSERTION OF DIALYSIS CATHETER;  Surgeon: Virl Cagey, MD;  Location: [facility];  Service: General;   Laterality: Right;  . INSERTION OF DIALYSIS CATHETER Right 09/27/2019   Procedure: INSERTION OF DIALYSIS CATHETER;  Surgeon: Waynetta Sandy, MD;  Location: [facility];  Service: Vascular;  Laterality: Right;  . KIDNEY SURGERY       Home Medications:  Prior to Admission medications   Medication Sig Start Date End Date Taking? Authorizing Provider  acetaminophen (TYLENOL) 325 MG tablet Take 325 mg by mouth 2 (two) times daily.    Yes [provider]  albuterol (VENTOLIN HFA) 108 (90 Base) MCG/ACT inhaler Inhale 1 puff into the lungs every 6 (six) hours as needed for wheezing or shortness of breath.   Yes [provider]  apixaban (ELIQUIS) 5 MG TABS tablet Take 1 tablet (5 mg total) by mouth 2 (two) times daily. 10/01/19  Yes Shelly Coss, MD  aspirin 81 MG tablet Take 81 mg by mouth every morning.    Yes [provider]  atorvastatin (LIPITOR) 20 MG tablet Take 1 tablet by mouth at bedtime.  05/14/19  Yes [provider]  calcitRIOL (ROCALTROL) 0.25 MCG capsule Take 1 capsule (0.25 mcg total) by mouth every Monday, Wednesday, and Friday. 10/01/19  Yes Shelly Coss, MD  calcium carbonate (TUMS - DOSED IN MG ELEMENTAL CALCIUM) 500 MG chewable tablet Chew 1 tablet by mouth daily as needed for indigestion or heartburn.   Yes [provider]  carvedilol (COREG) 25 MG tablet Take 1 tablet (25 mg total) by mouth 2 (two) times daily with a meal. 10/01/19  Yes Adhikari, Tamsen Meek, MD  Cholecalciferol (VITAMIN D3) 50 MCG (2000 UT) TABS Take 4,000 Units by mouth every morning.    Yes [provider]  Dextromethorphan-guaiFENesin 10-100 MG/5ML liquid Take 5 mLs by mouth daily as needed (cough).   Yes [provider]  docusate sodium (COLACE) 100 MG capsule Take 1 capsule (100 mg total) by mouth 2 (two) times daily. Hold for diarrhea Patient taking differently: Take 100 mg by mouth daily as needed. Hold for diarrhea 11/29/18  Yes Barton Dubois,  MD  Dulaglutide (TRULICITY) 1.5 HY/8.5OY SOPN Inject 1.5 mg into the skin every Friday.    Yes [provider]  DULoxetine (CYMBALTA) 60 MG capsule Take 1 capsule by mouth daily. 05/11/19  Yes [provider]  Emollient (CETAPHIL) cream Apply 1 application topically 2 (two) times daily. Applied to BLE  For dry skin   Yes [provider]  EPINEPHrine (EPI-PEN) 0.3 mg/0.3 mL DEVI Inject 0.3 mg into the muscle once as needed (for bee stings, then go to the ED immediately after using).  Yes [provider]  gabapentin (NEURONTIN) 100 MG capsule Take 1 capsule (100 mg total) by mouth 3 (three) times daily. Patient taking differently: Take 200 mg by mouth in the morning.  10/01/19  Yes Shelly Coss, MD  gabapentin (NEURONTIN) 100 MG capsule Take 100 mg by mouth in the morning and at bedtime.   Yes [provider]  hydrALAZINE (APRESOLINE) 100 MG tablet Take 1 tablet (100 mg total) by mouth every 8 (eight) hours. 10/01/19  Yes Adhikari, Tamsen Meek, MD  insulin degludec (TRESIBA FLEXTOUCH) 100 UNIT/ML SOPN FlexTouch Pen Inject 18 Units into the skin at bedtime.    Yes [provider]  insulin lispro (HUMALOG) 100 UNIT/ML injection Inject 2-12 Units into the skin 3 (three) times daily before meals. 151-200= 2 units 201-250= 4 units 251-300= 6 units 301-350= 8 units 351-400=10units 401-450=12units >400- call MD   Yes [provider]  levETIRAcetam (KEPPRA) 250 MG tablet Take 250 mg by mouth 2 (two) times daily.   Yes [provider]  levothyroxine (SYNTHROID) 50 MCG tablet Take 50 mcg by mouth daily before breakfast.   Yes [provider]  montelukast (SINGULAIR) 10 MG tablet Take 10 mg by mouth at bedtime.   Yes [provider]  Multiple Vitamin (DAILY VITE PO) Take 1 tablet by mouth every morning.    Yes [provider]  omeprazole (PRILOSEC) 20 MG capsule Take 20 mg by mouth every morning.    Yes [provider]  predniSONE (DELTASONE) 10 MG tablet Take 10 mg by mouth at bedtime.   Yes [provider]  primidone (MYSOLINE) 50 MG tablet Take 50 mg by mouth every morning.   Yes [provider]  PROTEIN PO Take 30 mLs by mouth in the morning and at bedtime.   Yes [provider]  pyridOXINE (VITAMIN B-6) 100 MG tablet Take 200 mg by mouth 2 (two) times daily.   Yes [provider]  risperiDONE (RISPERDAL) 0.5 MG tablet Take 1 tablet by mouth 2 (two) times daily. 05/11/19  Yes [provider]  sodium phosphate (FLEET) 7-19 GM/118ML ENEM Place 1 enema rectally daily as needed for mild constipation or severe constipation.   Yes [provider]  traMADol (ULTRAM) 50 MG tablet Take 1 tablet (50 mg total) by mouth every 6 (six) hours as needed for moderate pain. 10/01/19  Yes Shelly Coss, MD  traZODone (DESYREL) 50 MG tablet Take 50 mg by mouth at bedtime.  05/11/19  Yes [provider]  Vitamins A & D (VITAMIN A & D) ointment Apply 1 application topically See admin instructions. To be applied after baths in the morning due to rash under skin folds   Yes [provider]    Inpatient Medications: Scheduled Meds: . apixaban  5 mg Oral BID  . atorvastatin  20 mg Oral QHS  . carvedilol  25 mg Oral BID WC  . Chlorhexidine Gluconate Cloth  6 each Topical Daily  . DULoxetine  60 mg Oral Daily  . gabapentin  100 mg Oral BID  . insulin aspart  0-5 Units Subcutaneous QHS  . insulin aspart  0-9 Units Subcutaneous TID WC  . insulin glargine  18 Units Subcutaneous QHS  . levETIRAcetam  250 mg Oral BID  . levothyroxine  50 mcg Oral QAC breakfast  . montelukast  10 mg Oral QHS  . pantoprazole  40 mg Oral Daily  . predniSONE  10 mg Oral QHS  . primidone  50 mg  Oral q morning - 10a  . risperiDONE  0.5 mg Oral BID  . traZODone  50 mg Oral QHS   Continuous Infusions: . diltiazem (CARDIZEM) infusion Stopped (11/23/19 0259)   PRN  Meds: acetaminophen **OR** acetaminophen, ondansetron **OR** ondansetron (ZOFRAN) IV, polyethylene glycol  Allergies:    Allergies  Allergen Reactions  . Benztropine Mesylate Other (See Comments)    Alopecia and white spots on eyes  . Codeine Swelling, Palpitations and Other (See Comments)    Mouth Swelling and Tachycardia   . Diazepam Other (See Comments)    COMA  . Diphenhydramine Hcl Anxiety, Palpitations and Other (See Comments)    Tachycardia and anxiousness  . Penicillins Swelling and Other (See Comments)    REACTION: ulcers in mouth, swelling, throat swelling  Has patient had a PCN reaction causing immediate rash, facial/tongue/throat swelling, SOB or lightheadedness with hypotension: YES Has patient had a PCN reaction causing severe rash involving Mucus membranes or skin necrosis: Unknown Has patient had a PCN reaction that required hospitalization Unknown Has patient had a PCN reaction occurring within the last 10 years: unknown If all of the above answers are "NO", then may proceed with Cephalospori  . Sulfonamide Derivatives Rash    Blisters, also  . Thioridazine Hcl Swelling  . Lisinopril Cough    Social History:   Social History   Socioeconomic History  . Marital status: Single    Spouse name: Not on file  . Number of children: 0  . Years of education: Not on file  . Highest education level: 12th grade  Occupational History  . Not on file  Tobacco Use  . Smoking status: Former Smoker    Types: Cigarettes    Quit date: 06/23/1972    Years since quitting: 47.4  . Smokeless tobacco: Never Used  Substance and Sexual Activity  . Alcohol use: No  . Drug use: No  . Sexual activity: Never  Other Topics Concern  . Not on file  Social History Narrative   Right handed   Caffeine 1 cup per day    Lives at [place] Strain:   . Difficulty of Paying Living Expenses:     Food Insecurity:   . Worried About Charity fundraiser in the Last Year:   . Arboriculturist in the Last Year:   Transportation Needs:   . Film/video editor (Medical):   [name] Kitchen Lack of Transportation (Non-Medical):   Physical Activity:   . Days of Exercise per Week:   . Minutes of Exercise per Session:   Stress:   . Feeling of Stress :   Social Connections:   . Frequency of Communication with Friends and Family:   . Frequency of Social Gatherings with Friends and Family:   . Attends Religious Services:   . Active Member of Clubs or Organizations:   . Attends Archivist Meetings:   [name] Kitchen Marital Status:   Intimate Partner Violence:   . Fear of Current or Ex-Partner:   . Emotionally Abused:   [name] Kitchen Physically Abused:   . Sexually Abused:     Family History:    Family History  Problem Relation Age of Onset  . Bone cancer Mother   . Hypertension Mother   . Heart disease Mother   . COPD Father   . Heart disease Father   . Stroke Father  ROS:  Please see the history of present illness.  General:no colds or fevers, no weight changes Skin:no rashes or ulcers HEENT:no blurred vision, no congestion CV:see HPI PUL:see HPI GI:no diarrhea constipation or melena, no indigestion GU:no hematuria, no dysuria MS:no joint pain, no claudication Neuro:no syncope, no lightheadedness Endo:+ diabetes, + thyroid disease  All other ROS reviewed and negative.     Physical Exam/Data:   Vitals:   11/23/19 0615 11/23/19 0630 11/23/19 0645 11/23/19 0800  BP: (!) 133/55 (!) 150/59 (!) 129/58   Pulse: 65 67 64   Resp: 18 16 20    Temp:    98.6 F (37 C)  TempSrc:    Oral  SpO2: 100% 100% 99%   Weight:      Height:        Intake/Output Summary (Last 24 hours) at 11/23/2019 0816 Last data filed at 11/23/2019 0300 Gross per 24 hour  Intake 244.2 ml  Output --  Net 244.2 ml   Last 3 Weights 11/23/2019 11/23/2019 11/22/2019  Weight (lbs) 193 lb 5.5 oz 193 lb 5.5 oz 192 lb 14.4 oz   Weight (kg) 87.7 kg 87.7 kg 87.5 kg     Body mass index is 37.76 kg/m.  General:  Well nourished, well developed, in no acute distress , just completed Breakfast HEENT: normal Lymph: no adenopathy Neck: no JVD Endocrine:  No thryomegaly Vascular: No carotid bruits; pedal pulses 2+ bilaterally   Cardiac:  normal S1, S2; RRR; no murmur gallup rub or click Lungs:  clear to auscultation bilaterally, no wheezing, rhonchi or rales  Abd: soft, nontender, no hepatomegaly  Ext: no edema Musculoskeletal:  No deformities, BUE and BLE strength normal and equal Skin: warm and dry  Neuro:  Alert and oriented  X 3 MAE follows commands, no focal abnormalities noted Psych:  Normal affect    Relevant CV Studies: Echocardiogram: 01/12/2018 Study Conclusions  - Left ventricle: The cavity size was normal. There was mild focal basal hypertrophy of the septum. Systolic function was normal. The estimated ejection fraction was in the range of 60% to 65%. Wall motion was normal; there were no regional wall motion abnormalities. Doppler parameters are consistent with abnormal left ventricular relaxation (grade 1 diastolic dysfunction). - Aortic valve: Transvalvular velocity was within the normal range. There was no stenosis. There was no regurgitation. - Mitral valve: Transvalvular velocity was within the normal range. There was no evidence for stenosis. There was trivial regurgitation. - Left atrium: The atrium was moderately dilated. - Right ventricle: The cavity size was normal. Wall thickness was normal. Systolic function was normal. - Atrial septum: No defect or patent foramen ovale was identified. - Tricuspid valve: There was mild regurgitation. - Pulmonary arteries: Systolic pressure was within the normal range. PA peak pressure: 36 mm Hg (S). - Pericardium, extracardiac: A small pericardial effusion was identified. There was a left pleural effusion.   Laboratory  Data:  High Sensitivity Troponin:   Recent Labs  Lab 11/22/19 1434 11/22/19 1627 11/22/19 1843 11/22/19 2137  TROPONINIHS 40* 81* 181* 380*     Chemistry Recent Labs  Lab 11/22/19 1418 11/23/19 0458  NA 135 137  K 3.7 3.9  CL 99 102  CO2 26 24  GLUCOSE 211* 164*  BUN 36* 42*  CREATININE 2.86* 3.46*  CALCIUM 8.2* 8.4*  GFRNONAA 16* 13*  GFRAA 19* 15*  ANIONGAP 10 11    Recent Labs  Lab 11/22/19 1418  PROT 5.9*  ALBUMIN 3.1*  AST 32  ALT 25  ALKPHOS 59  BILITOT 0.7   Hematology Recent Labs  Lab 11/22/19 1418 11/23/19 0458  WBC 7.9 9.8  RBC 3.09* 3.04*  HGB 10.3* 10.0*  HCT 32.1* 32.1*  MCV 103.9* 105.6*  MCH 33.3 32.9  MCHC 32.1 31.2  RDW 16.6* 17.0*  PLT 231 196   BNP Recent Labs  Lab 11/22/19 1418  BNP 1,131.0*    DDimer No results for input(s): DDIMER in the last 168 hours.   Radiology/Studies:  DG Chest Port 1 View  Result Date: 11/22/2019 CLINICAL DATA:  Tachycardia. EXAM: PORTABLE CHEST 1 VIEW COMPARISON:  10/21/2019 and 09/27/2019 chest radiographs. FINDINGS: Tunneled left IJ central venous catheter tip at the superior cavoatrial junction. Minimal patchy left basilar opacities likely reflect atelectasis. No pneumothorax or pleural effusion. Decreased conspicuity of pulmonary vascular congestion and right hemidiaphragm elevation. Grossly unchanged cardiomegaly, accentuated by AP technique and mild lung hypoinflation. IMPRESSION: Decreased conspicuity of pulmonary vascular congestion. Left basilar atelectasis. Stable cardiomegaly. Electronically Signed   By: Primitivo Gauze M.D.   On: 11/22/2019 14:53        NO CHEST PAIN  Assessment and Plan:   1. SVT possible 2:1 flutter vs ST - was placed on Eliquis in Jan for similar episode her CHA2ds2vasC IS AT LEAST 4.  Thyroid is stable, placed on dilt drip now 2.5 mg - last Echo 2019 and ordered today. ASA on hold due to eliquis and would consider stopping altogether .   May need po dilt vs change  to another BB.   Another option is ablation Dr. Harl Bowie to see.  2. Elevated troponin could be demand ischemia with rapid HR,  Though on CTA of chest from Jan she does have severe CAD.  May need ischemic work up now she is on dialysis.  3. Chronic diastolic CHF improved on dialysis. On coreg 25 BID, hydralazine 100 mg every 8 hours  4. ESRD on HD, T/Th/Sat nephrology following 5.  HTN elevated on admit improved today   6. DM-2 per IM 7. RA on steroids per IM 8. Depresion/Schizophrenia per IM 9. Seizure hx per IM 10. HLD on statin followed by PCP no recent lipids in epic      For questions or updates, please contact [place] Please consult [email or web address] for contact info under     Signed, Cecilie Kicks, NP  11/23/2019 8:16 AM   Attending note Patient seen and disucssed with PA Dorene Ar, I agree with her documentation. 69 yo female history of Schizoaffective disorder, seizures, COPD, chronic diastolic HF, obesity hypovent, OSA, DM2, ESRD, PAF but not compliant with anticoag, admitted with tachycardia and palpitations. In ER episodes of paroxysmal aflutter with RVR, started on dilt gtt    Admit labs Lactic acid 1.4 TSH 3.2 K 3.8 Cr 2.63 BUN 21 WBC 8.8 Hgb 8.5 Plt 224 Mg 2.1  BNP 1131 TSH 2.2  ABG 7.4/47/72/29 hstrop 40-->81-->181-->380 - EKG SR, RBBB/LAFB. Episodes of aflutter with RVR COVID neg CXR Decreased conspicuity of pulmonary vascular congestion and right hemidiaphragm Elevation.  Jan 2020 echo LVEF 60-65%, grade I DDX, normal RV   Paroxysmal aflutter: EKGs reviewed back to Jan 2021, looks like aflutter at that time as well as opposed to afib. She is on eliquis 2.5mg  bid, from my understanding should be on 5mg  bid in setting of ESRD but will consult pharmacy. Would change coreg to toprol 200mg  daily for less bp effect and more room to add dilt as a second rate controlling agent. Could hold  other bp meds if need more room.  If ultimately fails rate control with her ESRD,  underlying conduction disease,severe LVH would use amiodarone. She reports until yesterday really had not had any recent symptoms.    Elevated troponin in setting of severe tachycardia, ESRD, fluid overload. Nonspeciic finding, do not suspect ACS at this time   Carlyle Dolly MD

## 2019-11-24 DIAGNOSIS — Z992 Dependence on renal dialysis: Secondary | ICD-10-CM

## 2019-11-24 DIAGNOSIS — I4891 Unspecified atrial fibrillation: Secondary | ICD-10-CM | POA: Diagnosis not present

## 2019-11-24 DIAGNOSIS — I132 Hypertensive heart and chronic kidney disease with heart failure and with stage 5 chronic kidney disease, or end stage renal disease: Secondary | ICD-10-CM | POA: Diagnosis not present

## 2019-11-24 DIAGNOSIS — R000 Tachycardia, unspecified: Secondary | ICD-10-CM | POA: Diagnosis not present

## 2019-11-24 DIAGNOSIS — Z20822 Contact with and (suspected) exposure to covid-19: Secondary | ICD-10-CM | POA: Diagnosis not present

## 2019-11-24 DIAGNOSIS — I484 Atypical atrial flutter: Secondary | ICD-10-CM | POA: Diagnosis not present

## 2019-11-24 DIAGNOSIS — L03115 Cellulitis of right lower limb: Secondary | ICD-10-CM | POA: Diagnosis not present

## 2019-11-24 DIAGNOSIS — I5032 Chronic diastolic (congestive) heart failure: Secondary | ICD-10-CM | POA: Diagnosis not present

## 2019-11-24 DIAGNOSIS — N186 End stage renal disease: Secondary | ICD-10-CM | POA: Diagnosis not present

## 2019-11-24 LAB — RENAL FUNCTION PANEL
Albumin: 2.8 g/dL — ABNORMAL LOW (ref 3.5–5.0)
Anion gap: 13 (ref 5–15)
BUN: 52 mg/dL — ABNORMAL HIGH (ref 8–23)
CO2: 24 mmol/L (ref 22–32)
Calcium: 8.8 mg/dL — ABNORMAL LOW (ref 8.9–10.3)
Chloride: 102 mmol/L (ref 98–111)
Creatinine, Ser: 4.38 mg/dL — ABNORMAL HIGH (ref 0.44–1.00)
GFR calc Af Amer: 11 mL/min — ABNORMAL LOW (ref >60)
GFR calc non Af Amer: 10 mL/min — ABNORMAL LOW (ref >60)
Glucose, Bld: 157 mg/dL — ABNORMAL HIGH (ref 70–99)
Phosphorus: 6.4 mg/dL — ABNORMAL HIGH (ref 2.5–4.6)
Potassium: 3.9 mmol/L (ref 3.5–5.1)
Sodium: 139 mmol/L (ref 135–145)

## 2019-11-24 LAB — CBC
HCT: 32.8 % — ABNORMAL LOW (ref 36.0–46.0)
Hemoglobin: 10.1 g/dL — ABNORMAL LOW (ref 12.0–15.0)
MCH: 32.5 pg (ref 26.0–34.0)
MCHC: 30.8 g/dL (ref 30.0–36.0)
MCV: 105.5 fL — ABNORMAL HIGH (ref 80.0–100.0)
Platelets: 198 10*3/uL (ref 150–400)
RBC: 3.11 MIL/uL — ABNORMAL LOW (ref 3.87–5.11)
RDW: 16.9 % — ABNORMAL HIGH (ref 11.5–15.5)
WBC: 8.5 10*3/uL (ref 4.0–10.5)
nRBC: 0 % (ref 0.0–0.2)

## 2019-11-24 LAB — GLUCOSE, CAPILLARY
Glucose-Capillary: 140 mg/dL — ABNORMAL HIGH (ref 70–99)
Glucose-Capillary: 186 mg/dL — ABNORMAL HIGH (ref 70–99)
Glucose-Capillary: 151 mg/dL — ABNORMAL HIGH (ref 70–99)
Glucose-Capillary: 114 mg/dL — ABNORMAL HIGH (ref 70–99)

## 2019-11-24 LAB — FERRITIN: Ferritin: 58 ng/mL (ref 11–307)

## 2019-11-24 LAB — IRON AND TIBC
Iron: 39 ug/dL (ref 28–170)
Saturation Ratios: 18 % (ref 10.4–31.8)
TIBC: 216 ug/dL — ABNORMAL LOW (ref 250–450)
UIBC: 177 ug/dL

## 2019-11-24 LAB — HEPATITIS B SURFACE ANTIGEN: Hepatitis B Surface Ag: NONREACTIVE

## 2019-11-24 MED ORDER — SODIUM CHLORIDE 0.9 % IV SOLN
125.0000 mg | Freq: Every day | INTRAVENOUS | Status: AC
  Administered 2019-11-24 – 2019-11-27 (×4): 125 mg via INTRAVENOUS
  Filled 2019-11-24 (×4): qty 10

## 2019-11-24 MED ORDER — DILTIAZEM HCL-DEXTROSE 125-5 MG/125ML-% IV SOLN (PREMIX)
5.0000 mg/h | INTRAVENOUS | Status: DC
  Administered 2019-11-24 (×2): 5 mg/h via INTRAVENOUS
  Filled 2019-11-24: qty 125

## 2019-11-24 MED ORDER — HEPARIN SODIUM (PORCINE) 1000 UNIT/ML IJ SOLN
3000.0000 [IU] | INTRAMUSCULAR | Status: DC
  Administered 2019-11-24 – 2019-11-27 (×2): 3000 [IU]
  Filled 2019-11-24: qty 3

## 2019-11-24 MED ORDER — HEPARIN SODIUM (PORCINE) 1000 UNIT/ML DIALYSIS
20.0000 [IU]/kg | INTRAMUSCULAR | Status: DC | PRN
  Administered 2019-11-24: 1800 [IU] via INTRAVENOUS_CENTRAL
  Filled 2019-11-24: qty 2

## 2019-11-24 MED ORDER — APIXABAN 5 MG PO TABS
5.0000 mg | ORAL_TABLET | Freq: Two times a day (BID) | ORAL | Status: DC
  Administered 2019-11-24 – 2019-11-27 (×6): 5 mg via ORAL
  Filled 2019-11-24 (×6): qty 1

## 2019-11-24 MED ORDER — DILTIAZEM HCL ER COATED BEADS 120 MG PO CP24
120.0000 mg | ORAL_CAPSULE | Freq: Every day | ORAL | Status: DC
  Administered 2019-11-24: 120 mg via ORAL
  Filled 2019-11-24: qty 1

## 2019-11-24 NOTE — Procedures (Signed)
     HEMODIALYSIS TREATMENT NOTE:  While HD equipment was being set up at bedside, patient converted from NSR to Afib with RVR 135-140, sustained.  Denied chest pain, dyspnea, or dizziness but stated she could tell her heart was "racing a little."  Primary RN at bedside, stat EKG obtained, and pt was assessed by Dr. Carles Collet.  Diltiazem gtt was restarted.  HD was initiated at reduced Qb of 300 after d/w Dr. Carolin Sicks.  Pt converted to rate-controlled Afib, then NSR with frequent PACs 30 minutes into treatment.  4 hour session completed. Goal met: 4.2 liters removed without interruption in UF.  All blood was returned.  Dilt gtt has been discontinued by primary RN.  Rockwell Alexandria, RN

## 2019-11-24 NOTE — Progress Notes (Signed)
[facility] RN in room to start patient on Cross, however patient went into A. Fib RVR with HR sustaining in 130's before being started on Cross. Pt states she does feel her heart racing, but no shortness of breath or chest pain. Dr. Carles Collet notified. EKG obtained and Dr. Carles Collet in room to assess patient. Verbal orders given to place patient back on cardizem gtt and proceed with diaylsis.

## 2019-11-24 NOTE — Progress Notes (Signed)
PROGRESS NOTE  [name] MBT:[id number] DOB: 05-Dec-1950 DOA: 11/22/2019 PCP: Glenda Chroman, MD   Brief History:  69 year old female with a history of ESRD (T-Th-Sat), paroxysmal atrial fibrillation, seizure disorder, diabetes mellitus type 2, remote PE 2010, right bundle branch block, diastolic CHF, hypertension, OHS, hyperlipidemia, hypothyroidism, COPD, fibromyalgia presenting from her dialysis center with elevated heart rate.  Unfortunately, the patient is a poor historian.  She is able to give some general information.  She states she woke up on the morning of 11/22/2019 feeling some palpitations, but denied any dizziness, shortness of breath, or syncope.  She was transported dialysis where she was dialyzed for 1-1/2 hours.  At that time, the patient was noted to have heart rate in the 150s.  The patient was not particular symptomatic.  She denied any chest pain, dizziness, nausea, vomiting, diaphoresis.  She has not had any fevers, chills, vomiting, diarrhea, abdominal pain, dysuria, hematuria. Notably, the patient has noted some redness to her right greater than left leg, but she is unable to tell me how long this has been going on but she is able to state that her legs hurt her.  She denies any recent injury or trauma.  Recentprolongedhospitalization-1/21-2/15, for acute on chronic kidney disease stage V, acute hypoxic and hypercapnic respiratory failure requiring intubation due to altered mental status,secondary to pneumonia, chest CTA negative for PE. Patient was volume overloaded, with worsening CKD with diuresis. Hemodialysis was started 2/9. Also diagnosed with paroxysmal atrial fibrillation during hospitalization, and was discharged on Eliquis 2.5 mg twice daily.  In the emergency department, the patient was noted to have atrial fibrillation with RVR with a heart rate in the 140s.  She subsequently converted back to sinus rhythm but 2 hours later, she went back to atrial  fibrillation.  The patient was started on a diltiazem drip and admitted for further evaluation and treatment.  Assessment/Plan: Atrial Flutter with RVR -TSH 2.274 -Continue apixaban -Echocardiogram--EF65-70%, no WMA, mild to mod AS, mod TR -Transition to oral diltiazem>>switch to long acting -d/c diltiazem drip  Cellulitis legs -Affecting right leg greater than left -Start vancomycin given the patient's penicillin allergy  Pyuria -follow urine culture  ESRD -Nephrology consult -Maintenance dialysis on Tuesday/Thursday/Saturday  Essential hypertension -carvedilol switched to metoprolol for HR control -Holding hydralazine to allow for blood pressure margin for titration of diltiazem  Diabetes mellitus type 2 -09/06/2019 hemoglobin A1c 5.2 -Repeat A1c--pending -NovoLog sliding scale -Holding Trulicity -Reduced dose Lantus  Seizure disorder -Continue Keppra  Hypothyroidism -Continue Synthroid  Depression/anxiety -Continue home dose of Cymbalta, Risperdal, trazodone  Rheumatoid arthritis -Patient is on chronic prednisone  Chronic diastolic CHF -fluid management via HD  Hyperlipidemia -continue statin    Disposition Plan: Patient From: SNF D/C Place: SNF 4/11 Barriers: Not Clinically Stable--IV abx, awaiting final culture  Family Communication:  No Family at bedside  Consultants:  renal  Code Status:  FULL   DVT Prophylaxis:  apixaban   Procedures: As Listed in Progress Note Above  Antibiotics: vanco 4/9>>      Subjective: Patient denies fevers, chills, headache, chest pain, dyspnea, nausea, vomiting, diarrhea, abdominal pain, dysuria, hematuria, hematochezia, and melena. She states her legs are less painful  Objective: Vitals:   11/24/19 0600 11/24/19 0727 11/24/19 0800 11/24/19 0900  BP: (!) 126/59  (!) 151/70 140/63  Pulse: 67 73 67 72  Resp: (!) 21 13 15 15   Temp:  98.3 F (36.8 C)  TempSrc:  Oral    SpO2:  96% 96% 99% 95%  Weight:      Height:        Intake/Output Summary (Last 24 hours) at 11/24/2019 1014 Last data filed at 11/24/2019 0900 Gross per 24 hour  Intake 482.04 ml  Output 1000 ml  Net -517.96 ml   Weight change: 1 kg Exam:   General:  Pt is alert, follows commands appropriately, not in acute distress  HEENT: No icterus, No thrush, No neck mass, [place]/AT  Cardiovascular: RRR, S1/S2, no rubs, no gallops  Respiratory: fine bibasilar crackles. No wheeze  Abdomen: Soft/+BS, non tender, non distended, no guarding  Extremities: No edema, erythema bilateral pre-tibial area R>LNo petechiae, No rashes, no synovitis   Data Reviewed: I have personally reviewed following labs and imaging studies Basic Metabolic Panel: Recent Labs  Lab 11/22/19 1418 11/22/19 1627 11/23/19 0458 11/24/19 0441  NA 135  --  137 139  K 3.7  --  3.9 3.9  CL 99  --  102 102  CO2 26  --  24 24  GLUCOSE 211*  --  164* 157*  BUN 36*  --  42* 52*  CREATININE 2.86*  --  3.46* 4.38*  CALCIUM 8.2*  --  8.4* 8.8*  MG  --  1.8  --   --   PHOS  --   --   --  6.4*   Liver Function Tests: Recent Labs  Lab 11/22/19 1418 11/24/19 0441  AST 32  --   ALT 25  --   ALKPHOS 59  --   BILITOT 0.7  --   PROT 5.9*  --   ALBUMIN 3.1* 2.8*   No results for input(s): LIPASE, AMYLASE in the last 168 hours. No results for input(s): AMMONIA in the last 168 hours. Coagulation Profile: No results for input(s): INR, PROTIME in the last 168 hours. CBC: Recent Labs  Lab 11/22/19 1418 11/23/19 0458 11/24/19 0440  WBC 7.9 9.8 8.5  NEUTROABS 7.0  --   --   HGB 10.3* 10.0* 10.1*  HCT 32.1* 32.1* 32.8*  MCV 103.9* 105.6* 105.5*  PLT 231 196 198   Cardiac Enzymes: No results for input(s): CKTOTAL, CKMB, CKMBINDEX, TROPONINI in the last 168 hours. BNP: Invalid input(s): POCBNP CBG: Recent Labs  Lab 11/23/19 0752 11/23/19 1200 11/23/19 1611 11/23/19 2215 11/24/19 0726  GLUCAP 135* 161* 144* 134* 140*     HbA1C: No results for input(s): HGBA1C in the last 72 hours. Urine analysis:    Component Value Date/Time   COLORURINE YELLOW 11/23/2019 0923   APPEARANCEUR CLOUDY (A) 11/23/2019 0923   LABSPEC 1.009 11/23/2019 0923   PHURINE 6.0 11/23/2019 0923   GLUCOSEU 50 (A) 11/23/2019 0923   HGBUR SMALL (A) 11/23/2019 0923   HGBUR negative 10/20/2009 1425   BILIRUBINUR NEGATIVE 11/23/2019 0923   KETONESUR NEGATIVE 11/23/2019 0923   PROTEINUR >=300 (A) 11/23/2019 0923   UROBILINOGEN 0.2 10/05/2011 1002   NITRITE NEGATIVE 11/23/2019 0923   LEUKOCYTESUR LARGE (A) 11/23/2019 0923   Sepsis Labs: @LABRCNTIP (procalcitonin:4,lacticidven:4) ) Recent Results (from the past 240 hour(s))  Culture, blood (Routine X 2) w Reflex to ID Panel     Status: None (Preliminary result)   Collection Time: 11/22/19  2:34 PM   Specimen: Right Antecubital; Blood  Result Value Ref Range Status   Specimen Description RIGHT ANTECUBITAL  Final   Special Requests   Final    BOTTLES DRAWN AEROBIC AND ANAEROBIC Blood Culture adequate volume  Culture   Final    NO GROWTH 2 DAYS Performed at [facility], [address], [place], [place] [address]    Report Status PENDING  Incomplete  Culture, blood (Routine X 2) w Reflex to ID Panel     Status: None (Preliminary result)   Collection Time: 11/22/19  2:34 PM   Specimen: BLOOD RIGHT HAND  Result Value Ref Range Status   Specimen Description BLOOD RIGHT HAND  Final   Special Requests   Final    BOTTLES DRAWN AEROBIC AND ANAEROBIC Blood Culture adequate volume   Culture   Final    NO GROWTH 2 DAYS Performed at [facility], [address] [place], [place] [address]    Report Status PENDING  Incomplete  Respiratory Panel by RT PCR (Flu A&B, Covid) - Nasopharyngeal Swab     Status: None   Collection Time: 11/22/19  7:13 PM   Specimen: Nasopharyngeal Swab  Result Value Ref Range Status   SARS Coronavirus 2 by RT PCR NEGATIVE NEGATIVE Final    Comment:  (NOTE) SARS-CoV-2 target nucleic acids are NOT DETECTED. The SARS-CoV-2 RNA is generally detectable in upper respiratoy specimens during the acute phase of infection. The lowest concentration of SARS-CoV-2 viral copies this assay can detect is 131 copies/mL. A negative result does not preclude SARS-Cov-2 infection and should not be used as the sole basis for treatment or other patient management decisions. A negative result may occur with  improper specimen collection/handling, submission of specimen other than nasopharyngeal swab, presence of viral mutation(s) within the areas targeted by this assay, and inadequate number of viral copies (<131 copies/mL). A negative result must be combined with clinical observations, patient history, and epidemiological information. The expected result is Negative. Fact Sheet for Patients:  PinkCheek.be Fact Sheet for Healthcare Providers:  GravelBags.it This test is not yet ap proved or cleared by the [place] FDA and  has been authorized for detection and/or diagnosis of SARS-CoV-2 by FDA under an Emergency Use Authorization (EUA). This EUA will remain  in effect (meaning this test can be used) for the duration of the COVID-19 declaration under Section 564(b)(1) of the Act, 21 U.S.C. section 360bbb-3(b)(1), unless the authorization is terminated or revoked sooner.    Influenza A by PCR NEGATIVE NEGATIVE Final   Influenza B by PCR NEGATIVE NEGATIVE Final    Comment: (NOTE) The Xpert Xpress SARS-CoV-2/FLU/RSV assay is intended as an aid in  the diagnosis of influenza from Nasopharyngeal swab specimens and  should not be used as a sole basis for treatment. Nasal washings and  aspirates are unacceptable for Xpert Xpress SARS-CoV-2/FLU/RSV  testing. Fact Sheet for Patients: PinkCheek.be Fact Sheet for Healthcare  Providers: GravelBags.it This test is not yet approved or cleared by the [place] FDA and  has been authorized for detection and/or diagnosis of SARS-CoV-2 by  FDA under an Emergency Use Authorization (EUA). This EUA will remain  in effect (meaning this test can be used) for the duration of the  Covid-19 declaration under Section 564(b)(1) of the Act, 21  U.S.C. section 360bbb-3(b)(1), unless the authorization is  terminated or revoked. Performed at [facility], [address], [place], [place] [address]   MRSA PCR Screening     Status: None   Collection Time: 11/22/19  9:14 PM   Specimen: Nasal Mucosa; Nasopharyngeal  Result Value Ref Range Status   MRSA by PCR NEGATIVE NEGATIVE Final    Comment:        The GeneXpert  MRSA Assay (FDA approved for NASAL specimens only), is one component of a comprehensive MRSA colonization surveillance program. It is not intended to diagnose MRSA infection nor to guide or monitor treatment for MRSA infections. Performed at [facility], [address], [place], [place] [address]      Scheduled Meds: . apixaban  5 mg Oral BID  . atorvastatin  20 mg Oral QHS  . Chlorhexidine Gluconate Cloth  6 each Topical Daily  . Chlorhexidine Gluconate Cloth  6 each Topical Q0600  . darbepoetin (ARANESP) injection - DIALYSIS  200 mcg Intravenous Q Sat-HD  . diltiazem  30 mg Oral Q6H  . DULoxetine  60 mg Oral Daily  . gabapentin  100 mg Oral BID  . insulin aspart  0-5 Units Subcutaneous QHS  . insulin aspart  0-9 Units Subcutaneous TID WC  . insulin glargine  18 Units Subcutaneous QHS  . levETIRAcetam  250 mg Oral BID  . levothyroxine  50 mcg Oral QAC breakfast  . metoprolol succinate  200 mg Oral Daily  . montelukast  10 mg Oral QHS  . pantoprazole  40 mg Oral Daily  . predniSONE  10 mg Oral QHS  . primidone  50 mg Oral q morning - 10a  . risperiDONE  0.5 mg Oral BID  . traZODone  50 mg Oral QHS   Continuous  Infusions: . ferric gluconate (FERRLECIT/NULECIT) IV    . vancomycin      Procedures/Studies: DG Chest Port 1 View  Result Date: 11/22/2019 CLINICAL DATA:  Tachycardia. EXAM: PORTABLE CHEST 1 VIEW COMPARISON:  10/21/2019 and 09/27/2019 chest radiographs. FINDINGS: Tunneled left IJ central venous catheter tip at the superior cavoatrial junction. Minimal patchy left basilar opacities likely reflect atelectasis. No pneumothorax or pleural effusion. Decreased conspicuity of pulmonary vascular congestion and right hemidiaphragm elevation. Grossly unchanged cardiomegaly, accentuated by AP technique and mild lung hypoinflation. IMPRESSION: Decreased conspicuity of pulmonary vascular congestion. Left basilar atelectasis. Stable cardiomegaly. Electronically Signed   By: Primitivo Gauze M.D.   On: 11/22/2019 14:53   VAS US DUPLEX DIALYSIS ACCESS (AVF, AVG)  Result Date: 11/02/2019 DIALYSIS ACCESS Reason for Exam: Routine follow up. Access Site: Right Upper Extremity. Access Type: Brachial-cephalic AVF. History: Placed 09/27/19. Performing Technologist: Ralene Cork RVT  Examination Guidelines: A complete evaluation includes B-mode imaging, spectral Doppler, color Doppler, and power Doppler as needed of all accessible portions of each vessel. Unilateral testing is considered an integral part of a complete examination. Limited examinations for reoccurring indications may be performed as noted.  Findings: +--------------------+----------+-----------------+--------+ AVF                 PSV (cm/s)Flow Vol (mL/min)Comments +--------------------+----------+-----------------+--------+ Native artery inflow   198          1006                +--------------------+----------+-----------------+--------+ AVF Anastomosis        434                              +--------------------+----------+-----------------+--------+  +------------+----------+-------------+----------+--------+ OUTFLOW VEINPSV  (cm/s)Diameter (cm)Depth (cm)Describe +------------+----------+-------------+----------+--------+ Prox UA        159        0.58        1.54            +------------+----------+-------------+----------+--------+ Mid UA         141        0.68  0.45            +------------+----------+-------------+----------+--------+ Dist UA        189        0.63        0.50            +------------+----------+-------------+----------+--------+ AC Fossa       537     1.00/0.475  0.74/0.535         +------------+----------+-------------+----------+--------+   Summary: 1. Patent AVF. 2. Change in vessel diameter in the ante cubitum. 3. No significant branching.  *See table(s) above for measurements and observations.  Diagnosing physician: Servando Snare MD Electronically signed by Servando Snare MD on 11/02/2019 at 11:46:00 AM.    --------------------------------------------------------------------------------   Final    ECHOCARDIOGRAM COMPLETE  Result Date: 11/23/2019    ECHOCARDIOGRAM REPORT   Patient Name:   EDWINA GROSSBERG Date of Exam: 11/23/2019 Medical Rec #:  [id number]      Height:       60.0 in Accession #:    [id number]     Weight:       193.3 lb Date of Birth:  Oct 26, 1950      BSA:          1.840 m Patient Age:    64 years       BP:           127/55 mmHg Patient Gender: F              HR:           64 bpm. Exam Location:  [facility] Procedure: 2D Echo Indications:    Atrial Fibrillation 427.31 / I48.91  History:        Patient has prior history of Echocardiogram examinations, most                 recent 09/15/2018. CHF, COPD, Arrythmias:Atrial Fibrillation;                 Risk Factors:Diabetes, Dyslipidemia, Former Smoker and                 Hypertension. Schizophrenia, Polypharmacy, RBBB.  Sonographer:    Leavy Cella RDCS (AE) Referring Phys: [place]  1. Left ventricular ejection fraction, by estimation, is 65 to 70%. The left ventricle has normal function.  The left ventricle has no regional wall motion abnormalities. There is mild left ventricular hypertrophy. Left ventricular diastolic parameters are indeterminate.  2. Right ventricular systolic function is normal. The right ventricular size is moderately enlarged. There is moderately elevated pulmonary artery systolic pressure.  3. Left atrial size was severely dilated.  4. Right atrial size was severely dilated.  5. The mitral valve is normal in structure. Mild mitral valve regurgitation. No evidence of mitral stenosis.  6. Tricuspid valve regurgitation is moderate.  7. The aortic valve is tricuspid. Aortic valve regurgitation is not visualized. Mild to moderate aortic valve stenosis.  8. The inferior vena cava is normal in size with greater than 50% respiratory variability, suggesting right atrial pressure of 3 mmHg. FINDINGS  Left Ventricle: Left ventricular ejection fraction, by estimation, is 65 to 70%. The left ventricle has normal function. The left ventricle has no regional wall motion abnormalities. The left ventricular internal cavity size was normal in size. There is  mild left ventricular hypertrophy. Left ventricular diastolic parameters are indeterminate. Right Ventricle: The right ventricular size is moderately enlarged. No increase in right ventricular wall thickness. Right ventricular systolic function  is normal. There is moderately elevated pulmonary artery systolic pressure. The tricuspid regurgitant  velocity is 3.63 m/s, and with an assumed right atrial pressure of 10 mmHg, the estimated right ventricular systolic pressure is 26.3 mmHg. Left Atrium: Left atrial size was severely dilated. Right Atrium: Right atrial size was severely dilated. Pericardium: There is no evidence of pericardial effusion. Mitral Valve: The mitral valve is normal in structure. Mild mitral valve regurgitation. No evidence of mitral valve stenosis. Tricuspid Valve: The tricuspid valve is normal in structure. Tricuspid  valve regurgitation is moderate . No evidence of tricuspid stenosis. Aortic Valve: The aortic valve is tricuspid. . There is mild thickening and mild calcification of the aortic valve. Aortic valve regurgitation is not visualized. Mild to moderate aortic stenosis is present. Mild aortic valve annular calcification. There is mild thickening of the aortic valve. There is mild calcification of the aortic valve. Aortic valve mean gradient measures 15.3 mmHg. Aortic valve peak gradient measures 26.7 mmHg. Aortic valve area, by VTI measures 1.31 cm. Pulmonic Valve: The pulmonic valve was not well visualized. Pulmonic valve regurgitation is not visualized. No evidence of pulmonic stenosis. Aorta: The aortic root is normal in size and structure. Pulmonary Artery: Moderate pulmonary hypertension. Venous: The inferior vena cava is normal in size with greater than 50% respiratory variability, suggesting right atrial pressure of 3 mmHg. IAS/Shunts: No atrial level shunt detected by color flow Doppler.  LEFT VENTRICLE PLAX 2D LVIDd:         5.61 cm  Diastology LVIDs:         3.36 cm  LV e' lateral:   9.36 cm/s LV PW:         1.10 cm  LV E/e' lateral: 11.1 LV IVS:        1.04 cm  LV e' medial:    6.31 cm/s LVOT diam:     2.10 cm  LV E/e' medial:  16.5 LV SV:         72 LV SV Index:   39 LVOT Area:     3.46 cm  RIGHT VENTRICLE RV S prime:     11.40 cm/s TAPSE (M-mode): 1.8 cm LEFT ATRIUM              Index LA diam:        4.70 cm  2.55 cm/m LA Vol (A2C):   83.9 ml  45.60 ml/m LA Vol (A4C):   110.0 ml 59.78 ml/m LA Biplane Vol: 104.0 ml 56.52 ml/m  AORTIC VALVE AV Area (Vmax):    1.32 cm AV Area (Vmean):   1.26 cm AV Area (VTI):     1.31 cm AV Vmax:           258.33 cm/s AV Vmean:          184.000 cm/s AV VTI:            0.549 m AV Peak Grad:      26.7 mmHg AV Mean Grad:      15.3 mmHg LVOT Vmax:         98.80 cm/s LVOT Vmean:        67.133 cm/s LVOT VTI:          0.208 m LVOT/AV VTI ratio: 0.38  AORTA Ao Root diam: 2.80  cm MITRAL VALVE                TRICUSPID VALVE MV Area (PHT): 3.20 cm     TR Peak grad:   52.7 mmHg  MV Decel Time: 237 msec     TR Vmax:        363.00 cm/s MR Peak grad: 87.2 mmHg MR Vmax:      467.00 cm/s   SHUNTS MV E velocity: 104.00 cm/s  Systemic VTI:  0.21 m MV A velocity: 68.10 cm/s   Systemic Diam: 2.10 cm MV E/A ratio:  1.53 Carlyle Dolly MD Electronically signed by Carlyle Dolly MD Signature Date/Time: 11/23/2019/4:56:07 PM    Final     Orson Eva, DO  [facility]  If 7PM-7AM, please contact night-coverage [email or web address] Password [email or web address] 11/24/2019, 10:14 AM   LOS: 2 days

## 2019-11-25 DIAGNOSIS — N186 End stage renal disease: Secondary | ICD-10-CM | POA: Diagnosis not present

## 2019-11-25 DIAGNOSIS — I484 Atypical atrial flutter: Secondary | ICD-10-CM | POA: Diagnosis not present

## 2019-11-25 DIAGNOSIS — I4891 Unspecified atrial fibrillation: Secondary | ICD-10-CM | POA: Diagnosis not present

## 2019-11-25 DIAGNOSIS — L03115 Cellulitis of right lower limb: Secondary | ICD-10-CM | POA: Diagnosis not present

## 2019-11-25 DIAGNOSIS — I5032 Chronic diastolic (congestive) heart failure: Secondary | ICD-10-CM | POA: Diagnosis not present

## 2019-11-25 DIAGNOSIS — R000 Tachycardia, unspecified: Secondary | ICD-10-CM | POA: Diagnosis not present

## 2019-11-25 LAB — RENAL FUNCTION PANEL
Albumin: 2.8 g/dL — ABNORMAL LOW (ref 3.5–5.0)
Anion gap: 9 (ref 5–15)
BUN: 24 mg/dL — ABNORMAL HIGH (ref 8–23)
CO2: 27 mmol/L (ref 22–32)
Calcium: 8.6 mg/dL — ABNORMAL LOW (ref 8.9–10.3)
Chloride: 100 mmol/L (ref 98–111)
Creatinine, Ser: 2.77 mg/dL — ABNORMAL HIGH (ref 0.44–1.00)
GFR calc Af Amer: 20 mL/min — ABNORMAL LOW
GFR calc non Af Amer: 17 mL/min — ABNORMAL LOW
Glucose, Bld: 166 mg/dL — ABNORMAL HIGH (ref 70–99)
Phosphorus: 4.1 mg/dL (ref 2.5–4.6)
Potassium: 3.9 mmol/L (ref 3.5–5.1)
Sodium: 136 mmol/L (ref 135–145)

## 2019-11-25 LAB — GLUCOSE, CAPILLARY
Glucose-Capillary: 138 mg/dL — ABNORMAL HIGH (ref 70–99)
Glucose-Capillary: 185 mg/dL — ABNORMAL HIGH (ref 70–99)
Glucose-Capillary: 151 mg/dL — ABNORMAL HIGH (ref 70–99)
Glucose-Capillary: 168 mg/dL — ABNORMAL HIGH (ref 70–99)

## 2019-11-25 LAB — PARATHYROID HORMONE, INTACT (NO CA): PTH: 92 pg/mL — ABNORMAL HIGH (ref 15–65)

## 2019-11-25 MED ORDER — SODIUM CHLORIDE 0.9 % IV SOLN
6.0000 mg/kg | INTRAVENOUS | Status: DC
  Filled 2019-11-25: qty 7.42

## 2019-11-25 MED ORDER — SODIUM CHLORIDE 0.9 % IV SOLN
500.0000 mg | Freq: Once | INTRAVENOUS | Status: AC
  Administered 2019-11-25: 500 mg via INTRAVENOUS
  Filled 2019-11-25: qty 10

## 2019-11-25 MED ORDER — DILTIAZEM HCL ER COATED BEADS 240 MG PO CP24
240.0000 mg | ORAL_CAPSULE | Freq: Every day | ORAL | Status: DC
  Administered 2019-11-25 – 2019-11-27 (×3): 240 mg via ORAL
  Filled 2019-11-25 (×3): qty 1

## 2019-11-25 NOTE — Progress Notes (Addendum)
PROGRESS NOTE  Audra Kagel KWI:[id number] DOB: [birth year] DOA: 11/22/2019 PCP: Glenda Chroman, MD Brief History: 69 year old female with a history of ESRD(T-Th-Sat),paroxysmal atrial fibrillation, seizure disorder, diabetes mellitus type 2, remote PE 2010, right bundle branch block, diastolic CHF, hypertension, OHS, hyperlipidemia, hypothyroidism, COPD, fibromyalgia presenting from her dialysis center with elevated heart rate. Unfortunately, the patient is a poor historian. She is able to give some general information. She states she woke up on the morning of 11/22/2019 feeling some palpitations, but denied any dizziness, shortness of breath, or syncope. She was transported dialysis where she was dialyzed for 1-1/2 hours. At that time, the patient was noted to have heart rate in the 150s. The patient was not particular symptomatic. She denied any chest pain, dizziness, nausea, vomiting, diaphoresis. She has not had any fevers, chills, vomiting, diarrhea, abdominal pain, dysuria, hematuria. Notably, the patient has noted some redness to her right greater than left leg, but she is unable to tell me how long this has been going on but she is able to state that her legs hurt her. She denies any recent injury or trauma.  Recentprolongedhospitalization-1/21-2/15, for acute on chronic kidney disease stage V, acute hypoxic and hypercapnic respiratory failure requiring intubation due to altered mental status,secondary to pneumonia, chest CTA negative for PE. Patient was volume overloaded, with worsening CKD with diuresis. Hemodialysis was started 2/9. Also diagnosed with paroxysmal atrial fibrillation during hospitalization, and was discharged on Eliquis 2.5 mg twice daily.  In the emergency department, the patient was noted to have atrial fibrillation with RVR with a heart rate in the 140s. She subsequently converted back to sinus rhythm but 2 hours later, she went back to atrial  fibrillation. The patient was started on a diltiazem drip and admitted for further evaluation and treatment.  Assessment/Plan: Atrial Flutter with RVR -TSH 2.274 -Continue apixaban -Echocardiogram--EF65-70%, no WMA, mild to mod AS, mod TR -Transition to oral diltiazem>>switch to long acting -11/24/19--went back into RVR; restarted diltiazem drip -11/25/19--increase po diltiazem CD to 240 mg and d/c drip  Cellulitis legs -Affecting right leg greater than left -d/c vanc -start dapto for VRE coverage of UTI  UTI -presented with dysuria and low grade fever -suspect VRE--start daptomycin -awaiting final culture data  ESRD -Nephrology consult -Maintenance dialysis on Tuesday/Thursday/Saturday  Essential hypertension -carvedilol switched to metoprolol for HR control -Holding hydralazine to allow for blood pressure margin for titration of diltiazem  Diabetes mellitus type 2 -09/06/2019 hemoglobin A1c 5.2 -Repeat A1c--pending -NovoLog sliding scale -Holding Trulicity -Reduced dose Lantus  Seizure disorder -Continue Keppra  Hypothyroidism -Continue Synthroid  Depression/anxiety -Continue home dose of Cymbalta, Risperdal, trazodone  Rheumatoid arthritis -Patient is on chronic prednisone  Chronic diastolic CHF -fluid management via HD  Hyperlipidemia -continue statin    Disposition Plan: Patient From: SNF D/C Place: SNF 4/12 or 4/13 pending final urine culture data Barriers: Not Clinically Stable--IV abx, awaiting final culture--concerned about VRE; HR control on po diltiazem  Family Communication:NoFamily at bedside  Consultants:renal  Code Status: FULL   DVT Prophylaxis:apixaban   Procedures: As Listed in Progress Note Above  Antibiotics: vanco 4/9>>      Subjective: Patient denies fevers, chills, headache, chest pain, dyspnea, nausea, vomiting, diarrhea, abdominal pain, dysuria, hematuria, hematochezia, and  melena.   Objective: Vitals:   11/25/19 0800 11/25/19 0811 11/25/19 0900 11/25/19 0909  BP: (!) 108/56  125/81 125/81  Pulse: 83  99 (!) 104  Resp:  20  Temp:      TempSrc:      SpO2: 98%  100%   Weight:      Height:        Intake/Output Summary (Last 24 hours) at 11/25/2019 1037 Last data filed at 11/25/2019 0600 Gross per 24 hour  Intake 382.42 ml  Output 5600 ml  Net -5217.58 ml   Weight change: 0.1 kg Exam:   General:  Pt is alert, follows commands appropriately, not in acute distress  HEENT: No icterus, No thrush, No neck mass, [place]/AT  Cardiovascular: IRRR, S1/S2, no rubs, no gallops  Respiratory: fine bibasilar crackles. No wheeze  Abdomen: Soft/+BS, non tender, non distended, no guarding  Extremities: No edema, mild erythema right pretibial area without draining wounds.  No crepitance.   Data Reviewed: I have personally reviewed following labs and imaging studies Basic Metabolic Panel: Recent Labs  Lab 11/22/19 1418 11/22/19 1627 11/23/19 0458 11/24/19 0441 11/25/19 0416  NA 135  --  137 139 136  K 3.7  --  3.9 3.9 3.9  CL 99  --  102 102 100  CO2 26  --  24 24 27   GLUCOSE 211*  --  164* 157* 166*  BUN 36*  --  42* 52* 24*  CREATININE 2.86*  --  3.46* 4.38* 2.77*  CALCIUM 8.2*  --  8.4* 8.8* 8.6*  MG  --  1.8  --   --   --   PHOS  --   --   --  6.4* 4.1   Liver Function Tests: Recent Labs  Lab 11/22/19 1418 11/24/19 0441 11/25/19 0416  AST 32  --   --   ALT 25  --   --   ALKPHOS 59  --   --   BILITOT 0.7  --   --   PROT 5.9*  --   --   ALBUMIN 3.1* 2.8* 2.8*   No results for input(s): LIPASE, AMYLASE in the last 168 hours. No results for input(s): AMMONIA in the last 168 hours. Coagulation Profile: No results for input(s): INR, PROTIME in the last 168 hours. CBC: Recent Labs  Lab 11/22/19 1418 11/23/19 0458 11/24/19 0440  WBC 7.9 9.8 8.5  NEUTROABS 7.0  --   --   HGB 10.3* 10.0* 10.1*  HCT 32.1* 32.1* 32.8*  MCV 103.9*  105.6* 105.5*  PLT 231 196 198   Cardiac Enzymes: No results for input(s): CKTOTAL, CKMB, CKMBINDEX, TROPONINI in the last 168 hours. BNP: Invalid input(s): POCBNP CBG: Recent Labs  Lab 11/24/19 0726 11/24/19 1106 11/24/19 1635 11/24/19 2116 11/25/19 0722  GLUCAP 140* 186* 151* 114* 138*   HbA1C: No results for input(s): HGBA1C in the last 72 hours. Urine analysis:    Component Value Date/Time   COLORURINE YELLOW 11/23/2019 0923   APPEARANCEUR CLOUDY (A) 11/23/2019 0923   LABSPEC 1.009 11/23/2019 0923   PHURINE 6.0 11/23/2019 0923   GLUCOSEU 50 (A) 11/23/2019 0923   HGBUR SMALL (A) 11/23/2019 0923   HGBUR negative 10/20/2009 1425   BILIRUBINUR NEGATIVE 11/23/2019 0923   KETONESUR NEGATIVE 11/23/2019 0923   PROTEINUR >=300 (A) 11/23/2019 0923   UROBILINOGEN 0.2 10/05/2011 1002   NITRITE NEGATIVE 11/23/2019 0923   LEUKOCYTESUR LARGE (A) 11/23/2019 0923   Sepsis Labs: @LABRCNTIP (procalcitonin:4,lacticidven:4) ) Recent Results (from the past 240 hour(s))  Culture, blood (Routine X 2) w Reflex to ID Panel     Status: None (Preliminary result)   Collection Time: 11/22/19  2:34 PM   Specimen: Right  Antecubital; Blood  Result Value Ref Range Status   Specimen Description RIGHT ANTECUBITAL  Final   Special Requests   Final    BOTTLES DRAWN AEROBIC AND ANAEROBIC Blood Culture adequate volume   Culture   Final    NO GROWTH 2 DAYS Performed at [facility], [address], [place], [place] [address]    Report Status PENDING  Incomplete  Culture, blood (Routine X 2) w Reflex to ID Panel     Status: None (Preliminary result)   Collection Time: 11/22/19  2:34 PM   Specimen: BLOOD RIGHT HAND  Result Value Ref Range Status   Specimen Description BLOOD RIGHT HAND  Final   Special Requests   Final    BOTTLES DRAWN AEROBIC AND ANAEROBIC Blood Culture adequate volume   Culture   Final    NO GROWTH 2 DAYS Performed at [facility], [address] [place], [place] [address]     Report Status PENDING  Incomplete  Respiratory Panel by RT PCR (Flu A&B, Covid) - Nasopharyngeal Swab     Status: None   Collection Time: 11/22/19  7:13 PM   Specimen: Nasopharyngeal Swab  Result Value Ref Range Status   SARS Coronavirus 2 by RT PCR NEGATIVE NEGATIVE Final    Comment: (NOTE) SARS-CoV-2 target nucleic acids are NOT DETECTED. The SARS-CoV-2 RNA is generally detectable in upper respiratoy specimens during the acute phase of infection. The lowest concentration of SARS-CoV-2 viral copies this assay can detect is 131 copies/mL. A negative result does not preclude SARS-Cov-2 infection and should not be used as the sole basis for treatment or other patient management decisions. A negative result may occur with  improper specimen collection/handling, submission of specimen other than nasopharyngeal swab, presence of viral mutation(s) within the areas targeted by this assay, and inadequate number of viral copies (<131 copies/mL). A negative result must be combined with clinical observations, patient history, and epidemiological information. The expected result is Negative. Fact Sheet for Patients:  PinkCheek.be Fact Sheet for Healthcare Providers:  GravelBags.it This test is not yet ap proved or cleared by the [place] FDA and  has been authorized for detection and/or diagnosis of SARS-CoV-2 by FDA under an Emergency Use Authorization (EUA). This EUA will remain  in effect (meaning this test can be used) for the duration of the COVID-19 declaration under Section 564(b)(1) of the Act, 21 U.S.C. section 360bbb-3(b)(1), unless the authorization is terminated or revoked sooner.    Influenza A by PCR NEGATIVE NEGATIVE Final   Influenza B by PCR NEGATIVE NEGATIVE Final    Comment: (NOTE) The Xpert Xpress SARS-CoV-2/FLU/RSV assay is intended as an aid in  the diagnosis of influenza from Nasopharyngeal swab specimens and   should not be used as a sole basis for treatment. Nasal washings and  aspirates are unacceptable for Xpert Xpress SARS-CoV-2/FLU/RSV  testing. Fact Sheet for Patients: PinkCheek.be Fact Sheet for Healthcare Providers: GravelBags.it This test is not yet approved or cleared by the [place] FDA and  has been authorized for detection and/or diagnosis of SARS-CoV-2 by  FDA under an Emergency Use Authorization (EUA). This EUA will remain  in effect (meaning this test can be used) for the duration of the  Covid-19 declaration under Section 564(b)(1) of the Act, 21  U.S.C. section 360bbb-3(b)(1), unless the authorization is  terminated or revoked. Performed at [facility], [address], [place], [place] [address]   MRSA PCR Screening     Status: None   Collection Time:  11/22/19  9:14 PM   Specimen: Nasal Mucosa; Nasopharyngeal  Result Value Ref Range Status   MRSA by PCR NEGATIVE NEGATIVE Final    Comment:        The GeneXpert MRSA Assay (FDA approved for NASAL specimens only), is one component of a comprehensive MRSA colonization surveillance program. It is not intended to diagnose MRSA infection nor to guide or monitor treatment for MRSA infections. Performed at [facility], [address] [place], [place] [address]   Culture, Urine     Status: Abnormal (Preliminary result)   Collection Time: 11/23/19  9:24 AM   Specimen: Urine, Random  Result Value Ref Range Status   Specimen Description   Final    URINE, RANDOM Performed at [facility], [address], [place], [place] [address]    Special Requests   Final    NONE Performed at [facility], [address] [place], [place] [address]    Culture (A)  Final    >=100,000 COLONIES/mL ENTEROCOCCUS FAECIUM REPEATING SUSCEPTIBILITY Performed at [facility], [place] [address], [place],  [address]    Report Status PENDING  Incomplete     Scheduled  Meds: . apixaban  5 mg Oral BID  . atorvastatin  20 mg Oral QHS  . Chlorhexidine Gluconate Cloth  6 each Topical Daily  . Chlorhexidine Gluconate Cloth  6 each Topical Q0600  . darbepoetin (ARANESP) injection - DIALYSIS  200 mcg Intravenous Q Sat-HD  . diltiazem  240 mg Oral Daily  . DULoxetine  60 mg Oral Daily  . gabapentin  100 mg Oral BID  . heparin  3,000 Units Intracatheter Q T,Th,Sa-HD  . insulin aspart  0-5 Units Subcutaneous QHS  . insulin aspart  0-9 Units Subcutaneous TID WC  . insulin glargine  18 Units Subcutaneous QHS  . levETIRAcetam  250 mg Oral BID  . levothyroxine  50 mcg Oral QAC breakfast  . metoprolol succinate  200 mg Oral Daily  . montelukast  10 mg Oral QHS  . pantoprazole  40 mg Oral Daily  . predniSONE  10 mg Oral QHS  . primidone  50 mg Oral q morning - 10a  . risperiDONE  0.5 mg Oral BID  . traZODone  50 mg Oral QHS   Continuous Infusions: . diltiazem (CARDIZEM) infusion 5 mg/hr (11/25/19 0600)  . ferric gluconate (FERRLECIT/NULECIT) IV Stopped (11/24/19 1228)  . vancomycin Stopped (11/24/19 1711)    Procedures/Studies: DG Chest Port 1 View  Result Date: 11/22/2019 CLINICAL DATA:  Tachycardia. EXAM: PORTABLE CHEST 1 VIEW COMPARISON:  10/21/2019 and 09/27/2019 chest radiographs. FINDINGS: Tunneled left IJ central venous catheter tip at the superior cavoatrial junction. Minimal patchy left basilar opacities likely reflect atelectasis. No pneumothorax or pleural effusion. Decreased conspicuity of pulmonary vascular congestion and right hemidiaphragm elevation. Grossly unchanged cardiomegaly, accentuated by AP technique and mild lung hypoinflation. IMPRESSION: Decreased conspicuity of pulmonary vascular congestion. Left basilar atelectasis. Stable cardiomegaly. Electronically Signed   By: Primitivo Gauze M.D.   On: 11/22/2019 14:53   VAS US DUPLEX DIALYSIS ACCESS (AVF, AVG)  Result Date: 11/02/2019 DIALYSIS ACCESS Reason for Exam: Routine follow up.  Access Site: Right Upper Extremity. Access Type: Brachial-cephalic AVF. History: Placed 09/27/19. Performing Technologist: Ralene Cork RVT  Examination Guidelines: A complete evaluation includes B-mode imaging, spectral Doppler, color Doppler, and power Doppler as needed of all accessible portions of each vessel. Unilateral testing is considered an integral part of a complete examination. Limited examinations for reoccurring indications may  be performed as noted.  Findings: +--------------------+----------+-----------------+--------+ AVF                 PSV (cm/s)Flow Vol (mL/min)Comments +--------------------+----------+-----------------+--------+ Native artery inflow   198          1006                +--------------------+----------+-----------------+--------+ AVF Anastomosis        434                              +--------------------+----------+-----------------+--------+  +------------+----------+-------------+----------+--------+ OUTFLOW VEINPSV (cm/s)Diameter (cm)Depth (cm)Describe +------------+----------+-------------+----------+--------+ Prox UA        159        0.58        1.54            +------------+----------+-------------+----------+--------+ Mid UA         141        0.68        0.45            +------------+----------+-------------+----------+--------+ Dist UA        189        0.63        0.50            +------------+----------+-------------+----------+--------+ AC Fossa       537     1.00/0.475  0.74/0.535         +------------+----------+-------------+----------+--------+   Summary: 1. Patent AVF. 2. Change in vessel diameter in the ante cubitum. 3. No significant branching.  *See table(s) above for measurements and observations.  Diagnosing physician: Servando Snare MD Electronically signed by Servando Snare MD on 11/02/2019 at 11:46:00 AM.    --------------------------------------------------------------------------------   Final     ECHOCARDIOGRAM COMPLETE  Result Date: 11/23/2019    ECHOCARDIOGRAM REPORT   Patient Name:   [name] Date of Exam: 11/23/2019 Medical Rec #:  [id number]      Height:       60.0 in Accession #:    [id number]     Weight:       193.3 lb Date of Birth:  02-23-1951      BSA:          1.840 m Patient Age:    42 years       BP:           127/55 mmHg Patient Gender: F              HR:           64 bpm. Exam Location:  [facility] Procedure: 2D Echo Indications:    Atrial Fibrillation 427.31 / I48.91  History:        Patient has prior history of Echocardiogram examinations, most                 recent 09/15/2018. CHF, COPD, Arrythmias:Atrial Fibrillation;                 Risk Factors:Diabetes, Dyslipidemia, Former Smoker and                 Hypertension. Schizophrenia, Polypharmacy, RBBB.  Sonographer:    Leavy Cella RDCS (AE) Referring Phys: [place]  1. Left ventricular ejection fraction, by estimation, is 65 to 70%. The left ventricle has normal function. The left ventricle has no regional wall motion abnormalities. There is mild left ventricular hypertrophy. Left ventricular diastolic parameters are indeterminate.  2. Right ventricular systolic  function is normal. The right ventricular size is moderately enlarged. There is moderately elevated pulmonary artery systolic pressure.  3. Left atrial size was severely dilated.  4. Right atrial size was severely dilated.  5. The mitral valve is normal in structure. Mild mitral valve regurgitation. No evidence of mitral stenosis.  6. Tricuspid valve regurgitation is moderate.  7. The aortic valve is tricuspid. Aortic valve regurgitation is not visualized. Mild to moderate aortic valve stenosis.  8. The inferior vena cava is normal in size with greater than 50% respiratory variability, suggesting right atrial pressure of 3 mmHg. FINDINGS  Left Ventricle: Left ventricular ejection fraction, by estimation, is 65 to 70%. The left ventricle has  normal function. The left ventricle has no regional wall motion abnormalities. The left ventricular internal cavity size was normal in size. There is  mild left ventricular hypertrophy. Left ventricular diastolic parameters are indeterminate. Right Ventricle: The right ventricular size is moderately enlarged. No increase in right ventricular wall thickness. Right ventricular systolic function is normal. There is moderately elevated pulmonary artery systolic pressure. The tricuspid regurgitant  velocity is 3.63 m/s, and with an assumed right atrial pressure of 10 mmHg, the estimated right ventricular systolic pressure is 02.4 mmHg. Left Atrium: Left atrial size was severely dilated. Right Atrium: Right atrial size was severely dilated. Pericardium: There is no evidence of pericardial effusion. Mitral Valve: The mitral valve is normal in structure. Mild mitral valve regurgitation. No evidence of mitral valve stenosis. Tricuspid Valve: The tricuspid valve is normal in structure. Tricuspid valve regurgitation is moderate . No evidence of tricuspid stenosis. Aortic Valve: The aortic valve is tricuspid. . There is mild thickening and mild calcification of the aortic valve. Aortic valve regurgitation is not visualized. Mild to moderate aortic stenosis is present. Mild aortic valve annular calcification. There is mild thickening of the aortic valve. There is mild calcification of the aortic valve. Aortic valve mean gradient measures 15.3 mmHg. Aortic valve peak gradient measures 26.7 mmHg. Aortic valve area, by VTI measures 1.31 cm. Pulmonic Valve: The pulmonic valve was not well visualized. Pulmonic valve regurgitation is not visualized. No evidence of pulmonic stenosis. Aorta: The aortic root is normal in size and structure. Pulmonary Artery: Moderate pulmonary hypertension. Venous: The inferior vena cava is normal in size with greater than 50% respiratory variability, suggesting right atrial pressure of 3 mmHg.  IAS/Shunts: No atrial level shunt detected by color flow Doppler.  LEFT VENTRICLE PLAX 2D LVIDd:         5.61 cm  Diastology LVIDs:         3.36 cm  LV e' lateral:   9.36 cm/s LV PW:         1.10 cm  LV E/e' lateral: 11.1 LV IVS:        1.04 cm  LV e' medial:    6.31 cm/s LVOT diam:     2.10 cm  LV E/e' medial:  16.5 LV SV:         72 LV SV Index:   39 LVOT Area:     3.46 cm  RIGHT VENTRICLE RV S prime:     11.40 cm/s TAPSE (M-mode): 1.8 cm LEFT ATRIUM              Index LA diam:        4.70 cm  2.55 cm/m LA Vol (A2C):   83.9 ml  45.60 ml/m LA Vol (A4C):   110.0 ml 59.78 ml/m LA Biplane Vol: 104.0 ml  56.52 ml/m  AORTIC VALVE AV Area (Vmax):    1.32 cm AV Area (Vmean):   1.26 cm AV Area (VTI):     1.31 cm AV Vmax:           258.33 cm/s AV Vmean:          184.000 cm/s AV VTI:            0.549 m AV Peak Grad:      26.7 mmHg AV Mean Grad:      15.3 mmHg LVOT Vmax:         98.80 cm/s LVOT Vmean:        67.133 cm/s LVOT VTI:          0.208 m LVOT/AV VTI ratio: 0.38  AORTA Ao Root diam: 2.80 cm MITRAL VALVE                TRICUSPID VALVE MV Area (PHT): 3.20 cm     TR Peak grad:   52.7 mmHg MV Decel Time: 237 msec     TR Vmax:        363.00 cm/s MR Peak grad: 87.2 mmHg MR Vmax:      467.00 cm/s   SHUNTS MV E velocity: 104.00 cm/s  Systemic VTI:  0.21 m MV A velocity: 68.10 cm/s   Systemic Diam: 2.10 cm MV E/A ratio:  1.53 Carlyle Dolly MD Electronically signed by Carlyle Dolly MD Signature Date/Time: 11/23/2019/4:56:07 PM    Final     Orson Eva, DO  [facility]  If 7PM-7AM, please contact night-coverage [email or web address] Password [facility] 11/25/2019, 10:37 AM   LOS: 3 days

## 2019-11-26 DIAGNOSIS — I4819 Other persistent atrial fibrillation: Secondary | ICD-10-CM | POA: Diagnosis not present

## 2019-11-26 DIAGNOSIS — L03115 Cellulitis of right lower limb: Secondary | ICD-10-CM | POA: Diagnosis not present

## 2019-11-26 DIAGNOSIS — I4891 Unspecified atrial fibrillation: Secondary | ICD-10-CM | POA: Diagnosis not present

## 2019-11-26 DIAGNOSIS — R000 Tachycardia, unspecified: Secondary | ICD-10-CM | POA: Diagnosis not present

## 2019-11-26 DIAGNOSIS — I484 Atypical atrial flutter: Secondary | ICD-10-CM | POA: Diagnosis not present

## 2019-11-26 DIAGNOSIS — I5032 Chronic diastolic (congestive) heart failure: Secondary | ICD-10-CM | POA: Diagnosis not present

## 2019-11-26 DIAGNOSIS — R778 Other specified abnormalities of plasma proteins: Secondary | ICD-10-CM | POA: Diagnosis not present

## 2019-11-26 LAB — RENAL FUNCTION PANEL
Albumin: 2.8 g/dL — ABNORMAL LOW (ref 3.5–5.0)
Anion gap: 13 (ref 5–15)
BUN: 40 mg/dL — ABNORMAL HIGH (ref 8–23)
CO2: 23 mmol/L (ref 22–32)
Calcium: 8.6 mg/dL — ABNORMAL LOW (ref 8.9–10.3)
Chloride: 97 mmol/L — ABNORMAL LOW (ref 98–111)
Creatinine, Ser: 3.81 mg/dL — ABNORMAL HIGH (ref 0.44–1.00)
GFR calc Af Amer: 13 mL/min — ABNORMAL LOW (ref >60)
GFR calc non Af Amer: 11 mL/min — ABNORMAL LOW (ref >60)
Glucose, Bld: 169 mg/dL — ABNORMAL HIGH (ref 70–99)
Phosphorus: 6.3 mg/dL — ABNORMAL HIGH (ref 2.5–4.6)
Potassium: 4.2 mmol/L (ref 3.5–5.1)
Sodium: 133 mmol/L — ABNORMAL LOW (ref 135–145)

## 2019-11-26 LAB — GLUCOSE, CAPILLARY
Glucose-Capillary: 164 mg/dL — ABNORMAL HIGH (ref 70–99)
Glucose-Capillary: 196 mg/dL — ABNORMAL HIGH (ref 70–99)
Glucose-Capillary: 134 mg/dL — ABNORMAL HIGH (ref 70–99)
Glucose-Capillary: 161 mg/dL — ABNORMAL HIGH (ref 70–99)

## 2019-11-26 LAB — URINE CULTURE: Culture: >=100000 — AB

## 2019-11-26 LAB — HEMOGLOBIN A1C
Hgb A1c MFr Bld: 5.6 % (ref 4.8–5.6)
Mean Plasma Glucose: 114 mg/dL

## 2019-11-26 MED ORDER — SODIUM CHLORIDE 0.9 % IV SOLN
500.0000 mg | INTRAVENOUS | Status: DC
  Administered 2019-11-27: 500 mg via INTRAVENOUS
  Filled 2019-11-26: qty 10

## 2019-11-26 NOTE — Discharge Summary (Signed)
Physician Discharge Summary  Anniyah Mood AOZ:[id number] DOB: 1950/11/10 DOA: 11/22/2019  PCP: Glenda Chroman, MD  Admit date: 11/22/2019 Discharge date: 11/27/2019  Admitted From: SNF Disposition:  SNF  Recommendations for Outpatient Follow-up:  1. Follow up with PCP in 1-2 weeks 2. Please obtain BMP/CBC in one week    Discharge Condition: Stable CODE STATUS:FULL Diet recommendation: Renal   Brief/Interim Summary: 69 year old female with a history of ESRD(T-Th-Sat),paroxysmal atrial fibrillation, seizure disorder, diabetes mellitus type 2, remote PE 2010, right bundle branch block, diastolic CHF, hypertension, OHS, hyperlipidemia, hypothyroidism, COPD, fibromyalgia presenting from her dialysis center with elevated heart rate. Unfortunately, the patient is a poor historian. She is able to give some general information. She states she woke up on the morning of 11/22/2019 feeling some palpitations, but denied any dizziness, shortness of breath, or syncope. She was transported dialysis where she was dialyzed for 1-1/2 hours. At that time, the patient was noted to have heart rate in the 150s. The patient was not particular symptomatic. She denied any chest pain, dizziness, nausea, vomiting, diaphoresis. She has not had any fevers, chills, vomiting, diarrhea, abdominal pain, dysuria, hematuria. Notably, the patient has noted some redness to her right greater than left leg, but she is unable to tell me how long this has been going on but she is able to state that her legs hurt her. She denies any recent injury or trauma.  Recentprolongedhospitalization-1/21-2/15, for acute on chronic kidney disease stage V, acute hypoxic and hypercapnic respiratory failure requiring intubation due to altered mental status,secondary to pneumonia, chest CTA negative for PE. Patient was volume overloaded, with worsening CKD with diuresis. Hemodialysis was started 2/9. Also diagnosed with paroxysmal  atrial fibrillation during hospitalization, and was discharged on Eliquis 2.5 mg twice daily.  In the emergency department, the patient was noted to have atrial fibrillation with RVR with a heart rate in the 140s. She subsequently converted back to sinus rhythm but 2 hours later, she went back to atrial fibrillation. The patient was started on a diltiazem drip and admitted for further evaluation and treatment. She was transitioned to po diltiazem the dose of which was increased to obtain better HR control.  She had cellulitis of the legs which improved with vancomycin initially.  Vancomycin was transitioned to daptomycin due to VRE UTI.   Discharge Diagnoses:  Atrial Flutterwith RVR -TSH 2.274 -Continue apixaban -Echocardiogram--EF65-70%, no WMA, mild to mod AS, mod TR -Transition to oral diltiazem>>switch to long acting -11/24/19--went back into RVR; restarted diltiazem drip -11/25/19--increased po diltiazem CD to 240 mg and d/c drip  Cellulitis legs -Affecting right leg greater than left -improving initially on vanco -d/c vanc -start dapto for VRE coverage of UTI  UTI--VRE -presented with dysuria and low grade fever -started daptomycin 4/11--received a dose on 4/13 with HD -d/c with linezolid 600 mg bid x 3 days with first dose on 11/29/19 to finish one week of tx  ESRD -Nephrology consult -Maintenance dialysis on Tuesday/Thursday/Saturday -last HD on 11/27/19  Essential hypertension -carvedilolswitched to metoprolol succinate for HR control -Holding hydralazine to allow for blood pressure margin for titration of diltiazem  Diabetes mellitus type 2 -09/06/2019 hemoglobin A1c 5.2 -11/24/19 Repeat A1c--5.6 -NovoLog sliding scale -Holding Trulicity -Reduced dose Lantus during hospitalization  Seizure disorder -Continue Keppra  Hypothyroidism -Continue Synthroid  Depression/anxiety -Continue home dose of Cymbalta, Risperdal, trazodone  Rheumatoid  arthritis -Patient is on chronic prednisone  Chronic diastolic CHF -fluid management via HD  Hyperlipidemia -continue statin    Discharge  Instructions   Allergies as of 11/27/2019      Reactions   Benztropine Mesylate Other (See Comments)   Alopecia and white spots on eyes   Codeine Swelling, Palpitations, Other (See Comments)   Mouth Swelling and Tachycardia   Diazepam Other (See Comments)   COMA   Diphenhydramine Hcl Anxiety, Palpitations, Other (See Comments)   Tachycardia and anxiousness   Penicillins Swelling, Other (See Comments)   REACTION: ulcers in mouth, swelling, throat swelling  Has patient had a PCN reaction causing immediate rash, facial/tongue/throat swelling, SOB or lightheadedness with hypotension: YES Has patient had a PCN reaction causing severe rash involving Mucus membranes or skin necrosis: Unknown Has patient had a PCN reaction that required hospitalization Unknown Has patient had a PCN reaction occurring within the last 10 years: unknown If all of the above answers are "NO", then may proceed with Cephalospori   Sulfonamide Derivatives Rash   Blisters, also   Thioridazine Hcl Swelling   Lisinopril Cough      Medication List    STOP taking these medications   carvedilol 25 MG tablet Commonly known as: COREG   hydrALAZINE 100 MG tablet Commonly known as: APRESOLINE   traMADol 50 MG tablet Commonly known as: ULTRAM     TAKE these medications   acetaminophen 325 MG tablet Commonly known as: TYLENOL Take 325 mg by mouth 2 (two) times daily.   albuterol 108 (90 Base) MCG/ACT inhaler Commonly known as: VENTOLIN HFA Inhale 1 puff into the lungs every 6 (six) hours as needed for wheezing or shortness of breath.   apixaban 5 MG Tabs tablet Commonly known as: ELIQUIS Take 1 tablet (5 mg total) by mouth 2 (two) times daily.   aspirin 81 MG tablet Take 81 mg by mouth every morning.   atorvastatin 20 MG tablet Commonly known as:  LIPITOR Take 1 tablet by mouth at bedtime.   calcitRIOL 0.25 MCG capsule Commonly known as: ROCALTROL Take 1 capsule (0.25 mcg total) by mouth every Monday, Wednesday, and Friday.   calcium carbonate 500 MG chewable tablet Commonly known as: TUMS - dosed in mg elemental calcium Chew 1 tablet by mouth daily as needed for indigestion or heartburn.   cetaphil cream Apply 1 application topically 2 (two) times daily. Applied to BLE  For dry skin   DAILY VITE PO Take 1 tablet by mouth every morning.   Dextromethorphan-guaiFENesin 10-100 MG/5ML liquid Take 5 mLs by mouth daily as needed (cough).   diltiazem 240 MG 24 hr capsule Commonly known as: CARDIZEM CD Take 1 capsule (240 mg total) by mouth daily.   docusate sodium 100 MG capsule Commonly known as: COLACE Take 1 capsule (100 mg total) by mouth 2 (two) times daily. Hold for diarrhea What changed:   when to take this  reasons to take this   DULoxetine 60 MG capsule Commonly known as: CYMBALTA Take 1 capsule by mouth daily.   EPINEPHrine 0.3 mg/0.3 mL Devi Commonly known as: EPI-PEN Inject 0.3 mg into the muscle once as needed (for bee stings, then go to the ED immediately after using).   gabapentin 100 MG capsule Commonly known as: NEURONTIN Take 100 mg by mouth in the morning and at bedtime. What changed: Another medication with the same name was changed. Make sure you understand how and when to take each.   gabapentin 100 MG capsule Commonly known as: NEURONTIN Take 1 capsule (100 mg total) by mouth 3 (three) times daily. What changed:   how much  to take  when to take this   insulin lispro 100 UNIT/ML injection Commonly known as: HUMALOG Inject 2-12 Units into the skin 3 (three) times daily before meals. 151-200= 2 units 201-250= 4 units 251-300= 6 units 301-350= 8 units 351-400=10units 401-450=12units >400- call MD   levETIRAcetam 250 MG tablet Commonly known as: KEPPRA Take 250 mg by mouth 2 (two)  times daily.   levothyroxine 50 MCG tablet Commonly known as: SYNTHROID Take 50 mcg by mouth daily before breakfast.   linezolid 600 MG tablet Commonly known as: ZYVOX Take 1 tablet (600 mg total) by mouth 2 (two) times daily. X 3 days starting 11/29/19 Start taking on: November 29, 2019   metoprolol 200 MG 24 hr tablet Commonly known as: TOPROL-XL Take 1 tablet (200 mg total) by mouth daily. Take with or immediately following a meal.   montelukast 10 MG tablet Commonly known as: SINGULAIR Take 10 mg by mouth at bedtime.   omeprazole 20 MG capsule Commonly known as: PRILOSEC Take 20 mg by mouth every morning.   predniSONE 10 MG tablet Commonly known as: DELTASONE Take 10 mg by mouth at bedtime.   primidone 50 MG tablet Commonly known as: MYSOLINE Take 50 mg by mouth every morning.   PROTEIN PO Take 30 mLs by mouth in the morning and at bedtime.   pyridOXINE 100 MG tablet Commonly known as: VITAMIN B-6 Take 200 mg by mouth 2 (two) times daily.   risperiDONE 0.5 MG tablet Commonly known as: RISPERDAL Take 1 tablet by mouth 2 (two) times daily.   sodium phosphate 7-19 GM/118ML Enem Place 1 enema rectally daily as needed for mild constipation or severe constipation.   traZODone 50 MG tablet Commonly known as: DESYREL Take 50 mg by mouth at bedtime.   Tyler Aas FlexTouch 100 UNIT/ML FlexTouch Pen Generic drug: insulin degludec Inject 18 Units into the skin at bedtime.   Trulicity 1.5 WP/8.0DX Sopn Generic drug: Dulaglutide Inject 1.5 mg into the skin every Friday.   vitamin A & D ointment Apply 1 application topically See admin instructions. To be applied after baths in the morning due to rash under skin folds   Vitamin D3 50 MCG (2000 UT) Tabs Take 4,000 Units by mouth every morning.       Allergies  Allergen Reactions  . Benztropine Mesylate Other (See Comments)    Alopecia and white spots on eyes  . Codeine Swelling, Palpitations and Other (See Comments)     Mouth Swelling and Tachycardia   . Diazepam Other (See Comments)    COMA  . Diphenhydramine Hcl Anxiety, Palpitations and Other (See Comments)    Tachycardia and anxiousness  . Penicillins Swelling and Other (See Comments)    REACTION: ulcers in mouth, swelling, throat swelling  Has patient had a PCN reaction causing immediate rash, facial/tongue/throat swelling, SOB or lightheadedness with hypotension: YES Has patient had a PCN reaction causing severe rash involving Mucus membranes or skin necrosis: Unknown Has patient had a PCN reaction that required hospitalization Unknown Has patient had a PCN reaction occurring within the last 10 years: unknown If all of the above answers are "NO", then may proceed with Cephalospori  . Sulfonamide Derivatives Rash    Blisters, also  . Thioridazine Hcl Swelling  . Lisinopril Cough    Consultations:  Renal  cardiology   Procedures/Studies: DG Chest Port 1 View  Result Date: 11/22/2019 CLINICAL DATA:  Tachycardia. EXAM: PORTABLE CHEST 1 VIEW COMPARISON:  10/21/2019 and 09/27/2019 chest radiographs. FINDINGS:  Tunneled left IJ central venous catheter tip at the superior cavoatrial junction. Minimal patchy left basilar opacities likely reflect atelectasis. No pneumothorax or pleural effusion. Decreased conspicuity of pulmonary vascular congestion and right hemidiaphragm elevation. Grossly unchanged cardiomegaly, accentuated by AP technique and mild lung hypoinflation. IMPRESSION: Decreased conspicuity of pulmonary vascular congestion. Left basilar atelectasis. Stable cardiomegaly. Electronically Signed   By: Primitivo Gauze M.D.   On: 11/22/2019 14:53   VAS US DUPLEX DIALYSIS ACCESS (AVF, AVG)  Result Date: 11/02/2019 DIALYSIS ACCESS Reason for Exam: Routine follow up. Access Site: Right Upper Extremity. Access Type: Brachial-cephalic AVF. History: Placed 09/27/19. Performing Technologist: Ralene Cork RVT  Examination Guidelines: A complete  evaluation includes B-mode imaging, spectral Doppler, color Doppler, and power Doppler as needed of all accessible portions of each vessel. Unilateral testing is considered an integral part of a complete examination. Limited examinations for reoccurring indications may be performed as noted.  Findings: +--------------------+----------+-----------------+--------+ AVF                 PSV (cm/s)Flow Vol (mL/min)Comments +--------------------+----------+-----------------+--------+ Native artery inflow   198          1006                +--------------------+----------+-----------------+--------+ AVF Anastomosis        434                              +--------------------+----------+-----------------+--------+  +------------+----------+-------------+----------+--------+ OUTFLOW VEINPSV (cm/s)Diameter (cm)Depth (cm)Describe +------------+----------+-------------+----------+--------+ Prox UA        159        0.58        1.54            +------------+----------+-------------+----------+--------+ Mid UA         141        0.68        0.45            +------------+----------+-------------+----------+--------+ Dist UA        189        0.63        0.50            +------------+----------+-------------+----------+--------+ AC Fossa       537     1.00/0.475  0.74/0.535         +------------+----------+-------------+----------+--------+   Summary: 1. Patent AVF. 2. Change in vessel diameter in the ante cubitum. 3. No significant branching.  *See table(s) above for measurements and observations.  Diagnosing physician: Servando Snare MD Electronically signed by Servando Snare MD on 11/02/2019 at 11:46:00 AM.    --------------------------------------------------------------------------------   Final    ECHOCARDIOGRAM COMPLETE  Result Date: 11/23/2019    ECHOCARDIOGRAM REPORT   Patient Name:   MEKIA DIPINTO Date of Exam: 11/23/2019 Medical Rec #:  [id number]      Height:       60.0 in  Accession #:    [id number]     Weight:       193.3 lb Date of Birth:  Jan 08, 1951      BSA:          1.840 m Patient Age:    6 years       BP:           127/55 mmHg Patient Gender: F              HR:           64 bpm. Exam Location:  [facility]  Procedure: 2D Echo Indications:    Atrial Fibrillation 427.31 / I48.91  History:        Patient has prior history of Echocardiogram examinations, most                 recent 09/15/2018. CHF, COPD, Arrythmias:Atrial Fibrillation;                 Risk Factors:Diabetes, Dyslipidemia, Former Smoker and                 Hypertension. Schizophrenia, Polypharmacy, RBBB.  Sonographer:    Leavy Cella RDCS (AE) Referring Phys: [place]  1. Left ventricular ejection fraction, by estimation, is 65 to 70%. The left ventricle has normal function. The left ventricle has no regional wall motion abnormalities. There is mild left ventricular hypertrophy. Left ventricular diastolic parameters are indeterminate.  2. Right ventricular systolic function is normal. The right ventricular size is moderately enlarged. There is moderately elevated pulmonary artery systolic pressure.  3. Left atrial size was severely dilated.  4. Right atrial size was severely dilated.  5. The mitral valve is normal in structure. Mild mitral valve regurgitation. No evidence of mitral stenosis.  6. Tricuspid valve regurgitation is moderate.  7. The aortic valve is tricuspid. Aortic valve regurgitation is not visualized. Mild to moderate aortic valve stenosis.  8. The inferior vena cava is normal in size with greater than 50% respiratory variability, suggesting right atrial pressure of 3 mmHg. FINDINGS  Left Ventricle: Left ventricular ejection fraction, by estimation, is 65 to 70%. The left ventricle has normal function. The left ventricle has no regional wall motion abnormalities. The left ventricular internal cavity size was normal in size. There is  mild left ventricular hypertrophy.  Left ventricular diastolic parameters are indeterminate. Right Ventricle: The right ventricular size is moderately enlarged. No increase in right ventricular wall thickness. Right ventricular systolic function is normal. There is moderately elevated pulmonary artery systolic pressure. The tricuspid regurgitant  velocity is 3.63 m/s, and with an assumed right atrial pressure of 10 mmHg, the estimated right ventricular systolic pressure is 16.1 mmHg. Left Atrium: Left atrial size was severely dilated. Right Atrium: Right atrial size was severely dilated. Pericardium: There is no evidence of pericardial effusion. Mitral Valve: The mitral valve is normal in structure. Mild mitral valve regurgitation. No evidence of mitral valve stenosis. Tricuspid Valve: The tricuspid valve is normal in structure. Tricuspid valve regurgitation is moderate . No evidence of tricuspid stenosis. Aortic Valve: The aortic valve is tricuspid. . There is mild thickening and mild calcification of the aortic valve. Aortic valve regurgitation is not visualized. Mild to moderate aortic stenosis is present. Mild aortic valve annular calcification. There is mild thickening of the aortic valve. There is mild calcification of the aortic valve. Aortic valve mean gradient measures 15.3 mmHg. Aortic valve peak gradient measures 26.7 mmHg. Aortic valve area, by VTI measures 1.31 cm. Pulmonic Valve: The pulmonic valve was not well visualized. Pulmonic valve regurgitation is not visualized. No evidence of pulmonic stenosis. Aorta: The aortic root is normal in size and structure. Pulmonary Artery: Moderate pulmonary hypertension. Venous: The inferior vena cava is normal in size with greater than 50% respiratory variability, suggesting right atrial pressure of 3 mmHg. IAS/Shunts: No atrial level shunt detected by color flow Doppler.  LEFT VENTRICLE PLAX 2D LVIDd:         5.61 cm  Diastology LVIDs:         3.36 cm  LV  e' lateral:   9.36 cm/s LV PW:          1.10 cm  LV E/e' lateral: 11.1 LV IVS:        1.04 cm  LV e' medial:    6.31 cm/s LVOT diam:     2.10 cm  LV E/e' medial:  16.5 LV SV:         72 LV SV Index:   39 LVOT Area:     3.46 cm  RIGHT VENTRICLE RV S prime:     11.40 cm/s TAPSE (M-mode): 1.8 cm LEFT ATRIUM              Index LA diam:        4.70 cm  2.55 cm/m LA Vol (A2C):   83.9 ml  45.60 ml/m LA Vol (A4C):   110.0 ml 59.78 ml/m LA Biplane Vol: 104.0 ml 56.52 ml/m  AORTIC VALVE AV Area (Vmax):    1.32 cm AV Area (Vmean):   1.26 cm AV Area (VTI):     1.31 cm AV Vmax:           258.33 cm/s AV Vmean:          184.000 cm/s AV VTI:            0.549 m AV Peak Grad:      26.7 mmHg AV Mean Grad:      15.3 mmHg LVOT Vmax:         98.80 cm/s LVOT Vmean:        67.133 cm/s LVOT VTI:          0.208 m LVOT/AV VTI ratio: 0.38  AORTA Ao Root diam: 2.80 cm MITRAL VALVE                TRICUSPID VALVE MV Area (PHT): 3.20 cm     TR Peak grad:   52.7 mmHg MV Decel Time: 237 msec     TR Vmax:        363.00 cm/s MR Peak grad: 87.2 mmHg MR Vmax:      467.00 cm/s   SHUNTS MV E velocity: 104.00 cm/s  Systemic VTI:  0.21 m MV A velocity: 68.10 cm/s   Systemic Diam: 2.10 cm MV E/A ratio:  1.53 Carlyle Dolly MD Electronically signed by Carlyle Dolly MD Signature Date/Time: 11/23/2019/4:56:07 PM    Final         Discharge Exam: Vitals:   11/27/19 0502 11/27/19 0700  BP: (!) 112/53   Pulse: 76 66  Resp: (!) 23 (!) 22  Temp: 97.6 F (36.4 C)   SpO2: 97% 97%   Vitals:   11/27/19 0300 11/27/19 0500 11/27/19 0502 11/27/19 0700  BP:   (!) 112/53   Pulse: 70  76 66  Resp: (!) 22  (!) 23 (!) 22  Temp:   97.6 F (36.4 C)   TempSrc:   Oral   SpO2: 95%  97% 97%  Weight:  88.2 kg    Height:        General: Pt is alert, awake, not in acute distress Cardiovascular: IRRR, S1/S2 +, no rubs, no gallops Respiratory: bibasilar crackles. No wheeze Abdominal: Soft, NT, ND, bowel sounds + Extremities: no edema, no cyanosis   The results of significant  diagnostics from this hospitalization (including imaging, microbiology, ancillary and laboratory) are listed below for reference.    Significant Diagnostic Studies: DG Chest Port 1 View  Result Date: 11/22/2019 CLINICAL DATA:  Tachycardia. EXAM: PORTABLE CHEST  1 VIEW COMPARISON:  10/21/2019 and 09/27/2019 chest radiographs. FINDINGS: Tunneled left IJ central venous catheter tip at the superior cavoatrial junction. Minimal patchy left basilar opacities likely reflect atelectasis. No pneumothorax or pleural effusion. Decreased conspicuity of pulmonary vascular congestion and right hemidiaphragm elevation. Grossly unchanged cardiomegaly, accentuated by AP technique and mild lung hypoinflation. IMPRESSION: Decreased conspicuity of pulmonary vascular congestion. Left basilar atelectasis. Stable cardiomegaly. Electronically Signed   By: Primitivo Gauze M.D.   On: 11/22/2019 14:53   VAS US DUPLEX DIALYSIS ACCESS (AVF, AVG)  Result Date: 11/02/2019 DIALYSIS ACCESS Reason for Exam: Routine follow up. Access Site: Right Upper Extremity. Access Type: Brachial-cephalic AVF. History: Placed 09/27/19. Performing Technologist: Ralene Cork RVT  Examination Guidelines: A complete evaluation includes B-mode imaging, spectral Doppler, color Doppler, and power Doppler as needed of all accessible portions of each vessel. Unilateral testing is considered an integral part of a complete examination. Limited examinations for reoccurring indications may be performed as noted.  Findings: +--------------------+----------+-----------------+--------+ AVF                 PSV (cm/s)Flow Vol (mL/min)Comments +--------------------+----------+-----------------+--------+ Native artery inflow   198          1006                +--------------------+----------+-----------------+--------+ AVF Anastomosis        434                              +--------------------+----------+-----------------+--------+   +------------+----------+-------------+----------+--------+ OUTFLOW VEINPSV (cm/s)Diameter (cm)Depth (cm)Describe +------------+----------+-------------+----------+--------+ Prox UA        159        0.58        1.54            +------------+----------+-------------+----------+--------+ Mid UA         141        0.68        0.45            +------------+----------+-------------+----------+--------+ Dist UA        189        0.63        0.50            +------------+----------+-------------+----------+--------+ AC Fossa       537     1.00/0.475  0.74/0.535         +------------+----------+-------------+----------+--------+   Summary: 1. Patent AVF. 2. Change in vessel diameter in the ante cubitum. 3. No significant branching.  *See table(s) above for measurements and observations.  Diagnosing physician: Servando Snare MD Electronically signed by Servando Snare MD on 11/02/2019 at 11:46:00 AM.    --------------------------------------------------------------------------------   Final    ECHOCARDIOGRAM COMPLETE  Result Date: 11/23/2019    ECHOCARDIOGRAM REPORT   Patient Name:   LOREA KUPFER Date of Exam: 11/23/2019 Medical Rec #:  [id number]      Height:       60.0 in Accession #:    [id number]     Weight:       193.3 lb Date of Birth:  December 12, 1950      BSA:          1.840 m Patient Age:    37 years       BP:           127/55 mmHg Patient Gender: F              HR:  64 bpm. Exam Location:  [facility] Procedure: 2D Echo Indications:    Atrial Fibrillation 427.31 / I48.91  History:        Patient has prior history of Echocardiogram examinations, most                 recent 09/15/2018. CHF, COPD, Arrythmias:Atrial Fibrillation;                 Risk Factors:Diabetes, Dyslipidemia, Former Smoker and                 Hypertension. Schizophrenia, Polypharmacy, RBBB.  Sonographer:    Leavy Cella RDCS (AE) Referring Phys: [place]  1. Left ventricular ejection  fraction, by estimation, is 65 to 70%. The left ventricle has normal function. The left ventricle has no regional wall motion abnormalities. There is mild left ventricular hypertrophy. Left ventricular diastolic parameters are indeterminate.  2. Right ventricular systolic function is normal. The right ventricular size is moderately enlarged. There is moderately elevated pulmonary artery systolic pressure.  3. Left atrial size was severely dilated.  4. Right atrial size was severely dilated.  5. The mitral valve is normal in structure. Mild mitral valve regurgitation. No evidence of mitral stenosis.  6. Tricuspid valve regurgitation is moderate.  7. The aortic valve is tricuspid. Aortic valve regurgitation is not visualized. Mild to moderate aortic valve stenosis.  8. The inferior vena cava is normal in size with greater than 50% respiratory variability, suggesting right atrial pressure of 3 mmHg. FINDINGS  Left Ventricle: Left ventricular ejection fraction, by estimation, is 65 to 70%. The left ventricle has normal function. The left ventricle has no regional wall motion abnormalities. The left ventricular internal cavity size was normal in size. There is  mild left ventricular hypertrophy. Left ventricular diastolic parameters are indeterminate. Right Ventricle: The right ventricular size is moderately enlarged. No increase in right ventricular wall thickness. Right ventricular systolic function is normal. There is moderately elevated pulmonary artery systolic pressure. The tricuspid regurgitant  velocity is 3.63 m/s, and with an assumed right atrial pressure of 10 mmHg, the estimated right ventricular systolic pressure is 54.6 mmHg. Left Atrium: Left atrial size was severely dilated. Right Atrium: Right atrial size was severely dilated. Pericardium: There is no evidence of pericardial effusion. Mitral Valve: The mitral valve is normal in structure. Mild mitral valve regurgitation. No evidence of mitral valve  stenosis. Tricuspid Valve: The tricuspid valve is normal in structure. Tricuspid valve regurgitation is moderate . No evidence of tricuspid stenosis. Aortic Valve: The aortic valve is tricuspid. . There is mild thickening and mild calcification of the aortic valve. Aortic valve regurgitation is not visualized. Mild to moderate aortic stenosis is present. Mild aortic valve annular calcification. There is mild thickening of the aortic valve. There is mild calcification of the aortic valve. Aortic valve mean gradient measures 15.3 mmHg. Aortic valve peak gradient measures 26.7 mmHg. Aortic valve area, by VTI measures 1.31 cm. Pulmonic Valve: The pulmonic valve was not well visualized. Pulmonic valve regurgitation is not visualized. No evidence of pulmonic stenosis. Aorta: The aortic root is normal in size and structure. Pulmonary Artery: Moderate pulmonary hypertension. Venous: The inferior vena cava is normal in size with greater than 50% respiratory variability, suggesting right atrial pressure of 3 mmHg. IAS/Shunts: No atrial level shunt detected by color flow Doppler.  LEFT VENTRICLE PLAX 2D LVIDd:         5.61 cm  Diastology LVIDs:  3.36 cm  LV e' lateral:   9.36 cm/s LV PW:         1.10 cm  LV E/e' lateral: 11.1 LV IVS:        1.04 cm  LV e' medial:    6.31 cm/s LVOT diam:     2.10 cm  LV E/e' medial:  16.5 LV SV:         72 LV SV Index:   39 LVOT Area:     3.46 cm  RIGHT VENTRICLE RV S prime:     11.40 cm/s TAPSE (M-mode): 1.8 cm LEFT ATRIUM              Index LA diam:        4.70 cm  2.55 cm/m LA Vol (A2C):   83.9 ml  45.60 ml/m LA Vol (A4C):   110.0 ml 59.78 ml/m LA Biplane Vol: 104.0 ml 56.52 ml/m  AORTIC VALVE AV Area (Vmax):    1.32 cm AV Area (Vmean):   1.26 cm AV Area (VTI):     1.31 cm AV Vmax:           258.33 cm/s AV Vmean:          184.000 cm/s AV VTI:            0.549 m AV Peak Grad:      26.7 mmHg AV Mean Grad:      15.3 mmHg LVOT Vmax:         98.80 cm/s LVOT Vmean:        67.133  cm/s LVOT VTI:          0.208 m LVOT/AV VTI ratio: 0.38  AORTA Ao Root diam: 2.80 cm MITRAL VALVE                TRICUSPID VALVE MV Area (PHT): 3.20 cm     TR Peak grad:   52.7 mmHg MV Decel Time: 237 msec     TR Vmax:        363.00 cm/s MR Peak grad: 87.2 mmHg MR Vmax:      467.00 cm/s   SHUNTS MV E velocity: 104.00 cm/s  Systemic VTI:  0.21 m MV A velocity: 68.10 cm/s   Systemic Diam: 2.10 cm MV E/A ratio:  1.53 Carlyle Dolly MD Electronically signed by Carlyle Dolly MD Signature Date/Time: 11/23/2019/4:56:07 PM    Final      Microbiology: Recent Results (from the past 240 hour(s))  Culture, blood (Routine X 2) w Reflex to ID Panel     Status: None (Preliminary result)   Collection Time: 11/22/19  2:34 PM   Specimen: Right Antecubital; Blood  Result Value Ref Range Status   Specimen Description RIGHT ANTECUBITAL  Final   Special Requests   Final    BOTTLES DRAWN AEROBIC AND ANAEROBIC Blood Culture adequate volume   Culture   Final    NO GROWTH 4 DAYS Performed at [facility], [address], [place], [place] [address]    Report Status PENDING  Incomplete  Culture, blood (Routine X 2) w Reflex to ID Panel     Status: None (Preliminary result)   Collection Time: 11/22/19  2:34 PM   Specimen: BLOOD RIGHT HAND  Result Value Ref Range Status   Specimen Description BLOOD RIGHT HAND  Final   Special Requests   Final    BOTTLES DRAWN AEROBIC AND ANAEROBIC Blood Culture adequate volume   Culture   Final    NO GROWTH  4 DAYS Performed at [facility], [address], [place], [place] [address]    Report Status PENDING  Incomplete  Respiratory Panel by RT PCR (Flu A&B, Covid) - Nasopharyngeal Swab     Status: None   Collection Time: 11/22/19  7:13 PM   Specimen: Nasopharyngeal Swab  Result Value Ref Range Status   SARS Coronavirus 2 by RT PCR NEGATIVE NEGATIVE Final    Comment: (NOTE) SARS-CoV-2 target nucleic acids are NOT DETECTED. The SARS-CoV-2 RNA is generally detectable in upper  respiratoy specimens during the acute phase of infection. The lowest concentration of SARS-CoV-2 viral copies this assay can detect is 131 copies/mL. A negative result does not preclude SARS-Cov-2 infection and should not be used as the sole basis for treatment or other patient management decisions. A negative result may occur with  improper specimen collection/handling, submission of specimen other than nasopharyngeal swab, presence of viral mutation(s) within the areas targeted by this assay, and inadequate number of viral copies (<131 copies/mL). A negative result must be combined with clinical observations, patient history, and epidemiological information. The expected result is Negative. Fact Sheet for Patients:  PinkCheek.be Fact Sheet for Healthcare Providers:  GravelBags.it This test is not yet ap proved or cleared by the [place] FDA and  has been authorized for detection and/or diagnosis of SARS-CoV-2 by FDA under an Emergency Use Authorization (EUA). This EUA will remain  in effect (meaning this test can be used) for the duration of the COVID-19 declaration under Section 564(b)(1) of the Act, 21 U.S.C. section 360bbb-3(b)(1), unless the authorization is terminated or revoked sooner.    Influenza A by PCR NEGATIVE NEGATIVE Final   Influenza B by PCR NEGATIVE NEGATIVE Final    Comment: (NOTE) The Xpert Xpress SARS-CoV-2/FLU/RSV assay is intended as an aid in  the diagnosis of influenza from Nasopharyngeal swab specimens and  should not be used as a sole basis for treatment. Nasal washings and  aspirates are unacceptable for Xpert Xpress SARS-CoV-2/FLU/RSV  testing. Fact Sheet for Patients: PinkCheek.be Fact Sheet for Healthcare Providers: GravelBags.it This test is not yet approved or cleared by the [place] FDA and  has been authorized for  detection and/or diagnosis of SARS-CoV-2 by  FDA under an Emergency Use Authorization (EUA). This EUA will remain  in effect (meaning this test can be used) for the duration of the  Covid-19 declaration under Section 564(b)(1) of the Act, 21  U.S.C. section 360bbb-3(b)(1), unless the authorization is  terminated or revoked. Performed at [facility], [address], [place] , [place] [address]   MRSA PCR Screening     Status: None   Collection Time: 11/22/19  9:14 PM   Specimen: Nasal Mucosa; Nasopharyngeal  Result Value Ref Range Status   MRSA by PCR NEGATIVE NEGATIVE Final    Comment:        The GeneXpert MRSA Assay (FDA approved for NASAL specimens only), is one component of a comprehensive MRSA colonization surveillance program. It is not intended to diagnose MRSA infection nor to guide or monitor treatment for MRSA infections. Performed at [facility], [address], [place], [place] [address]   Culture, Urine     Status: Abnormal   Collection Time: 11/23/19  9:24 AM   Specimen: Urine, Random  Result Value Ref Range Status   Specimen Description   Final    URINE, RANDOM Performed at [facility], [address], [place], [place] [address]    Special Requests   Final  NONE Performed at [facility], [address] [place], [place] [address]    Culture (A)  Final    >=100,000 COLONIES/mL VANCOMYCIN RESISTANT ENTEROCOCCUS ISOLATED   Report Status 11/26/2019 FINAL  Final   Organism ID, Bacteria VANCOMYCIN RESISTANT ENTEROCOCCUS ISOLATED (A)  Final      Susceptibility   Vancomycin resistant enterococcus isolated - MIC*    AMPICILLIN >=32 RESISTANT Resistant     NITROFURANTOIN 256 RESISTANT Resistant     VANCOMYCIN >=32 RESISTANT Resistant     LINEZOLID 2 SENSITIVE Sensitive     * >=100,000 COLONIES/mL VANCOMYCIN RESISTANT ENTEROCOCCUS ISOLATED     Labs: Basic Metabolic Panel: Recent Labs  Lab 11/22/19 1418 11/22/19 1627 11/23/19 0458 11/23/19 0458  11/24/19 0441 11/24/19 0441 11/25/19 0416 11/25/19 0416 11/26/19 0407 11/27/19 0548  NA   < >  --  137  --  139  --  136  --  133* 130*  K   < >  --  3.9   < > 3.9   < > 3.9   < > 4.2 4.8  CL   < >  --  102  --  102  --  100  --  97* 95*  CO2   < >  --  24  --  24  --  27  --  23 23  GLUCOSE   < >  --  164*  --  157*  --  166*  --  169* 182*  BUN   < >  --  42*  --  52*  --  24*  --  40* 56*  CREATININE   < >  --  3.46*  --  4.38*  --  2.77*  --  3.81* 4.83*  CALCIUM   < >  --  8.4*  --  8.8*  --  8.6*  --  8.6* 8.6*  MG  --  1.8  --   --   --   --   --   --   --   --   PHOS  --   --   --   --  6.4*  --  4.1  --  6.3* 7.8*   < > = values in this interval not displayed.   Liver Function Tests: Recent Labs  Lab 11/22/19 1418 11/24/19 0441 11/25/19 0416 11/26/19 0407 11/27/19 0548  AST 32  --   --   --   --   ALT 25  --   --   --   --   ALKPHOS 59  --   --   --   --   BILITOT 0.7  --   --   --   --   PROT 5.9*  --   --   --   --   ALBUMIN 3.1* 2.8* 2.8* 2.8* 2.8*   No results for input(s): LIPASE, AMYLASE in the last 168 hours. No results for input(s): AMMONIA in the last 168 hours. CBC: Recent Labs  Lab 11/22/19 1418 11/23/19 0458 11/24/19 0440 11/27/19 0548  WBC 7.9 9.8 8.5 8.1  NEUTROABS 7.0  --   --   --   HGB 10.3* 10.0* 10.1* 9.8*  HCT 32.1* 32.1* 32.8* 31.3*  MCV 103.9* 105.6* 105.5* 104.7*  PLT 231 196 198 173   Cardiac Enzymes: No results for input(s): CKTOTAL, CKMB, CKMBINDEX, TROPONINI in the last 168 hours. BNP: Invalid input(s): POCBNP CBG: Recent Labs  Lab 11/26/19 0741 11/26/19 1123 11/26/19 1640  11/26/19 2150 11/27/19 0737  GLUCAP 164* 196* 134* 161* 181*    Time coordinating discharge:  36 minutes  Signed:  Orson Eva, DO [facility] Pager: [phone] 11/27/2019, 9:08 AM

## 2019-11-26 NOTE — Progress Notes (Signed)
PROGRESS NOTE  [name] PPJ:[id number] DOB: March 01, 1951 DOA: 11/22/2019 PCP: Kathleen Chroman, MD   Brief History: 68 year old female with a history of ESRD(T-Th-Sat),paroxysmal atrial fibrillation, seizure disorder, diabetes mellitus type 2, remote PE 2010, right bundle branch block, diastolic CHF, hypertension, OHS, hyperlipidemia, hypothyroidism, COPD, fibromyalgia presenting from her dialysis center with elevated heart rate. Unfortunately, the patient is a poor historian. She is able to give some general information. She states she woke up on the morning of 11/22/2019 feeling some palpitations, but denied any dizziness, shortness of breath, or syncope. She was transported dialysis where she was dialyzed for 1-1/2 hours. At that time, the patient was noted to have heart rate in the 150s. The patient was not particular symptomatic. She denied any chest pain, dizziness, nausea, vomiting, diaphoresis. She has not had any fevers, chills, vomiting, diarrhea, abdominal pain, dysuria, hematuria. Notably, the patient has noted some redness to her right greater than left leg, but she is unable to tell me how long this has been going on but she is able to state that her legs hurt her. She denies any recent injury or trauma.  Recentprolongedhospitalization-1/21-2/15, for acute on chronic kidney disease stage V, acute hypoxic and hypercapnic respiratory failure requiring intubation due to altered mental status,secondary to pneumonia, chest CTA negative for PE. Patient was volume overloaded, with worsening CKD with diuresis. Hemodialysis was started 2/9. Also diagnosed with paroxysmal atrial fibrillation during hospitalization, and was discharged on Eliquis 2.5 mg twice daily.  In the emergency department, the patient was noted to have atrial fibrillation with RVR with a heart rate in the 140s. She subsequently converted back to sinus rhythm but 2 hours later, she went back to atrial  fibrillation. The patient was started on a diltiazem drip and admitted for further evaluation and treatment.  Assessment/Plan: Atrial Flutterwith RVR -TSH 2.274 -Continue apixaban -Echocardiogram--EF65-70%, no WMA, mild to mod AS, mod TR -Transition to oral diltiazem>>switch to long acting -11/24/19--went back into RVR; restarted diltiazem drip -11/25/19--increased po diltiazem CD to 240 mg and d/c drip  Cellulitis legs -Affecting right leg greater than left -d/c vanc -start dapto for VRE coverage of UTI  UTI--VRE -presented with dysuria and low grade fever -started daptomycin  ESRD -Nephrology consult -Maintenance dialysis on Tuesday/Thursday/Saturday  Essential hypertension -carvedilolswitched to metoprolol for HR control -Holding hydralazine to allow for blood pressure margin for titration of diltiazem  Diabetes mellitus type 2 -09/06/2019 hemoglobin A1c 5.2 -Repeat A1c--pending -NovoLog sliding scale -Holding Trulicity -Reduced dose Lantus  Seizure disorder -Continue Keppra  Hypothyroidism -Continue Synthroid  Depression/anxiety -Continue home dose of Cymbalta, Risperdal, trazodone  Rheumatoid arthritis -Patient is on chronic prednisone  Chronic diastolic CHF -fluid management via HD  Hyperlipidemia -continue statin    Disposition Plan: Patient From: SNF D/C Place: SNF4/12 or 4/13 pending final urine culture data Barriers: Not Clinically Stable--IV abx,awaiting final culture--concerned about VRE; HR control on po diltiazem  Family Communication:NoFamily at bedside  Consultants:renal  Code Status: FULL   DVT Prophylaxis:apixaban   Procedures: As Listed in Progress Note Above  Antibiotics: vanco 4/9>>4/11 daptomycin 4/11>>>      Subjective: Patient denies fevers, chills, headache, chest pain, dyspnea, nausea, vomiting, diarrhea, abdominal pain, dysuria, hematuria, hematochezia, and  melena.   Objective: Vitals:   11/26/19 1100 11/26/19 1140 11/26/19 1200 11/26/19 1300  BP: 115/64  135/79 123/76  Pulse: 61  (!) 103 93  Resp: (!) 21  (!) 22 20  Temp:  97.9 F (36.6 C)    TempSrc:  Oral    SpO2: 100%  99% 100%  Weight:      Height:        Intake/Output Summary (Last 24 hours) at 11/26/2019 1542 Last data filed at 11/25/2019 2000 Gross per 24 hour  Intake 50 ml  Output 300 ml  Net -250 ml   Weight change: -3.6 kg Exam:   General:  Pt is alert, follows commands appropriately, not in acute distress  HEENT: No icterus, No thrush, No neck mass, Roy/AT  Cardiovascular: IRRR, S1/S2, no rubs, no gallops  Respiratory: bibasilar crackles. No wheeze  Abdomen: Soft/+BS, non tender, non distended, no guarding  Extremities: No edema, No lymphangitis, No petechiae, No rashes, no synovitis   Data Reviewed: I have personally reviewed following labs and imaging studies Basic Metabolic Panel: Recent Labs  Lab 11/22/19 1418 11/22/19 1627 11/23/19 0458 11/24/19 0441 11/25/19 0416 11/26/19 0407  NA 135  --  137 139 136 133*  K 3.7  --  3.9 3.9 3.9 4.2  CL 99  --  102 102 100 97*  CO2 26  --  24 24 27 23   GLUCOSE 211*  --  164* 157* 166* 169*  BUN 36*  --  42* 52* 24* 40*  CREATININE 2.86*  --  3.46* 4.38* 2.77* 3.81*  CALCIUM 8.2*  --  8.4* 8.8* 8.6* 8.6*  MG  --  1.8  --   --   --   --   PHOS  --   --   --  6.4* 4.1 6.3*   Liver Function Tests: Recent Labs  Lab 11/22/19 1418 11/24/19 0441 11/25/19 0416 11/26/19 0407  AST 32  --   --   --   ALT 25  --   --   --   ALKPHOS 59  --   --   --   BILITOT 0.7  --   --   --   PROT 5.9*  --   --   --   ALBUMIN 3.1* 2.8* 2.8* 2.8*   No results for input(s): LIPASE, AMYLASE in the last 168 hours. No results for input(s): AMMONIA in the last 168 hours. Coagulation Profile: No results for input(s): INR, PROTIME in the last 168 hours. CBC: Recent Labs  Lab 11/22/19 1418 11/23/19 0458 11/24/19 0440   WBC 7.9 9.8 8.5  NEUTROABS 7.0  --   --   HGB 10.3* 10.0* 10.1*  HCT 32.1* 32.1* 32.8*  MCV 103.9* 105.6* 105.5*  PLT 231 196 198   Cardiac Enzymes: No results for input(s): CKTOTAL, CKMB, CKMBINDEX, TROPONINI in the last 168 hours. BNP: Invalid input(s): POCBNP CBG: Recent Labs  Lab 11/25/19 1137 11/25/19 1631 11/25/19 2114 11/26/19 0741 11/26/19 1123  GLUCAP 185* 151* 168* 164* 196*   HbA1C: Recent Labs    11/24/19 0441  HGBA1C 5.6   Urine analysis:    Component Value Date/Time   COLORURINE YELLOW 11/23/2019 0923   APPEARANCEUR CLOUDY (A) 11/23/2019 0923   LABSPEC 1.009 11/23/2019 0923   PHURINE 6.0 11/23/2019 0923   GLUCOSEU 50 (A) 11/23/2019 0923   HGBUR SMALL (A) 11/23/2019 0923   HGBUR negative 10/20/2009 1425   BILIRUBINUR NEGATIVE 11/23/2019 0923   KETONESUR NEGATIVE 11/23/2019 0923   PROTEINUR >=300 (A) 11/23/2019 0923   UROBILINOGEN 0.2 10/05/2011 1002   NITRITE NEGATIVE 11/23/2019 0923   LEUKOCYTESUR LARGE (A) 11/23/2019 0923   Sepsis Labs: @LABRCNTIP (procalcitonin:4,lacticidven:4) ) Recent Results (from the past 240 hour(s))  Culture, blood (Routine X 2) w Reflex to ID Panel     Status: None (Preliminary result)   Collection Time: 11/22/19  2:34 PM   Specimen: Right Antecubital; Blood  Result Value Ref Range Status   Specimen Description RIGHT ANTECUBITAL  Final   Special Requests   Final    BOTTLES DRAWN AEROBIC AND ANAEROBIC Blood Culture adequate volume   Culture   Final    NO GROWTH 4 DAYS Performed at [facility], [address], [place], [place] [address]    Report Status PENDING  Incomplete  Culture, blood (Routine X 2) w Reflex to ID Panel     Status: None (Preliminary result)   Collection Time: 11/22/19  2:34 PM   Specimen: BLOOD RIGHT HAND  Result Value Ref Range Status   Specimen Description BLOOD RIGHT HAND  Final   Special Requests   Final    BOTTLES DRAWN AEROBIC AND ANAEROBIC Blood Culture adequate volume   Culture    Final    NO GROWTH 4 DAYS Performed at [facility], [address], [place], [place] [address]    Report Status PENDING  Incomplete  Respiratory Panel by RT PCR (Flu A&B, Covid) - Nasopharyngeal Swab     Status: None   Collection Time: 11/22/19  7:13 PM   Specimen: Nasopharyngeal Swab  Result Value Ref Range Status   SARS Coronavirus 2 by RT PCR NEGATIVE NEGATIVE Final    Comment: (NOTE) SARS-CoV-2 target nucleic acids are NOT DETECTED. The SARS-CoV-2 RNA is generally detectable in upper respiratoy specimens during the acute phase of infection. The lowest concentration of SARS-CoV-2 viral copies this assay can detect is 131 copies/mL. A negative result does not preclude SARS-Cov-2 infection and should not be used as the sole basis for treatment or other patient management decisions. A negative result may occur with  improper specimen collection/handling, submission of specimen other than nasopharyngeal swab, presence of viral mutation(s) within the areas targeted by this assay, and inadequate number of viral copies (<131 copies/mL). A negative result must be combined with clinical observations, patient history, and epidemiological information. The expected result is Negative. Fact Sheet for Patients:  PinkCheek.be Fact Sheet for Healthcare Providers:  GravelBags.it This test is not yet ap proved or cleared by the [place] FDA and  has been authorized for detection and/or diagnosis of SARS-CoV-2 by FDA under an Emergency Use Authorization (EUA). This EUA will remain  in effect (meaning this test can be used) for the duration of the COVID-19 declaration under Section 564(b)(1) of the Act, 21 U.S.C. section 360bbb-3(b)(1), unless the authorization is terminated or revoked sooner.    Influenza A by PCR NEGATIVE NEGATIVE Final   Influenza B by PCR NEGATIVE NEGATIVE Final    Comment: (NOTE) The Xpert Xpress  SARS-CoV-2/FLU/RSV assay is intended as an aid in  the diagnosis of influenza from Nasopharyngeal swab specimens and  should not be used as a sole basis for treatment. Nasal washings and  aspirates are unacceptable for Xpert Xpress SARS-CoV-2/FLU/RSV  testing. Fact Sheet for Patients: PinkCheek.be Fact Sheet for Healthcare Providers: GravelBags.it This test is not yet approved or cleared by the [place] FDA and  has been authorized for detection and/or diagnosis of SARS-CoV-2 by  FDA under an Emergency Use Authorization (EUA). This EUA will remain  in effect (meaning this test can be used) for the duration of the  Covid-19 declaration under Section 564(b)(1) of the Act, 21  U.S.C. section 360bbb-3(b)(1), unless the authorization is  terminated or revoked. Performed at [facility], [address], [place], [place] [address]   MRSA PCR Screening     Status: None   Collection Time: 11/22/19  9:14 PM   Specimen: Nasal Mucosa; Nasopharyngeal  Result Value Ref Range Status   MRSA by PCR NEGATIVE NEGATIVE Final    Comment:        The GeneXpert MRSA Assay (FDA approved for NASAL specimens only), is one component of a comprehensive MRSA colonization surveillance program. It is not intended to diagnose MRSA infection nor to guide or monitor treatment for MRSA infections. Performed at [facility], [address], [place], [place] [address]   Culture, Urine     Status: Abnormal   Collection Time: 11/23/19  9:24 AM   Specimen: Urine, Random  Result Value Ref Range Status   Specimen Description   Final    URINE, RANDOM Performed at [facility], [address] [place], [place] [address]    Special Requests   Final    NONE Performed at [facility], [address], [place], [place] [address]    Culture (A)  Final    >=100,000 COLONIES/mL VANCOMYCIN RESISTANT ENTEROCOCCUS ISOLATED   Report Status 11/26/2019 FINAL  Final    Organism ID, Bacteria VANCOMYCIN RESISTANT ENTEROCOCCUS ISOLATED (A)  Final      Susceptibility   Vancomycin resistant enterococcus isolated - MIC*    AMPICILLIN >=32 RESISTANT Resistant     NITROFURANTOIN 256 RESISTANT Resistant     VANCOMYCIN >=32 RESISTANT Resistant     LINEZOLID 2 SENSITIVE Sensitive     * >=100,000 COLONIES/mL VANCOMYCIN RESISTANT ENTEROCOCCUS ISOLATED     Scheduled Meds: . apixaban  5 mg Oral BID  . Chlorhexidine Gluconate Cloth  6 each Topical Daily  . Chlorhexidine Gluconate Cloth  6 each Topical Q0600  . darbepoetin (ARANESP) injection - DIALYSIS  200 mcg Intravenous Q Sat-HD  . diltiazem  240 mg Oral Daily  . DULoxetine  60 mg Oral Daily  . gabapentin  100 mg Oral BID  . heparin  3,000 Units Intracatheter Q T,Th,Sa-HD  . insulin aspart  0-5 Units Subcutaneous QHS  . insulin aspart  0-9 Units Subcutaneous TID WC  . insulin glargine  18 Units Subcutaneous QHS  . levETIRAcetam  250 mg Oral BID  . levothyroxine  50 mcg Oral QAC breakfast  . metoprolol succinate  200 mg Oral Daily  . montelukast  10 mg Oral QHS  . pantoprazole  40 mg Oral Daily  . predniSONE  10 mg Oral QHS  . primidone  50 mg Oral q morning - 10a  . risperiDONE  0.5 mg Oral BID  . traZODone  50 mg Oral QHS   Continuous Infusions: . [START ON 11/27/2019] DAPTOmycin (CUBICIN)  IV    . diltiazem (CARDIZEM) infusion Stopped (11/25/19 1016)  . ferric gluconate (FERRLECIT/NULECIT) IV 125 mg (11/26/19 1030)    Procedures/Studies: DG Chest Port 1 View  Result Date: 11/22/2019 CLINICAL DATA:  Tachycardia. EXAM: PORTABLE CHEST 1 VIEW COMPARISON:  10/21/2019 and 09/27/2019 chest radiographs. FINDINGS: Tunneled left IJ central venous catheter tip at the superior cavoatrial junction. Minimal patchy left basilar opacities likely reflect atelectasis. No pneumothorax or pleural effusion. Decreased conspicuity of pulmonary vascular congestion and right hemidiaphragm elevation. Grossly unchanged  cardiomegaly, accentuated by AP technique and mild lung hypoinflation. IMPRESSION: Decreased conspicuity of pulmonary vascular congestion. Left basilar atelectasis. Stable cardiomegaly. Electronically Signed   By: Primitivo Gauze M.D.   On: 11/22/2019 14:53  VAS US DUPLEX DIALYSIS ACCESS (AVF, AVG)  Result Date: 11/02/2019 DIALYSIS ACCESS Reason for Exam: Routine follow up. Access Site: Right Upper Extremity. Access Type: Brachial-cephalic AVF. History: Placed 09/27/19. Performing Technologist: Ralene Cork RVT  Examination Guidelines: A complete evaluation includes B-mode imaging, spectral Doppler, color Doppler, and power Doppler as needed of all accessible portions of each vessel. Unilateral testing is considered an integral part of a complete examination. Limited examinations for reoccurring indications may be performed as noted.  Findings: +--------------------+----------+-----------------+--------+ AVF                 PSV (cm/s)Flow Vol (mL/min)Comments +--------------------+----------+-----------------+--------+ Native artery inflow   198          1006                +--------------------+----------+-----------------+--------+ AVF Anastomosis        434                              +--------------------+----------+-----------------+--------+  +------------+----------+-------------+----------+--------+ OUTFLOW VEINPSV (cm/s)Diameter (cm)Depth (cm)Describe +------------+----------+-------------+----------+--------+ Prox UA        159        0.58        1.54            +------------+----------+-------------+----------+--------+ Mid UA         141        0.68        0.45            +------------+----------+-------------+----------+--------+ Dist UA        189        0.63        0.50            +------------+----------+-------------+----------+--------+ AC Fossa       537     1.00/0.475  0.74/0.535          +------------+----------+-------------+----------+--------+   Summary: 1. Patent AVF. 2. Change in vessel diameter in the ante cubitum. 3. No significant branching.  *See table(s) above for measurements and observations.  Diagnosing physician: Servando Snare MD Electronically signed by Servando Snare MD on 11/02/2019 at 11:46:00 AM.    --------------------------------------------------------------------------------   Final    ECHOCARDIOGRAM COMPLETE  Result Date: 11/23/2019    ECHOCARDIOGRAM REPORT   Patient Name:   [name] Date of Exam: 11/23/2019 Medical Rec #:  [id number]      Height:       60.0 in Accession #:    [id number]     Weight:       193.3 lb Date of Birth:  08-25-1950      BSA:          1.840 m Patient Age:    51 years       BP:           127/55 mmHg Patient Gender: F              HR:           64 bpm. Exam Location:  [facility] Procedure: 2D Echo Indications:    Atrial Fibrillation 427.31 / I48.91  History:        Patient has prior history of Echocardiogram examinations, most                 recent 09/15/2018. CHF, COPD, Arrythmias:Atrial Fibrillation;                 Risk Factors:Diabetes, Dyslipidemia, Former Smoker and  Hypertension. Schizophrenia, Polypharmacy, RBBB.  Sonographer:    Leavy Cella RDCS (AE) Referring Phys: [place]  1. Left ventricular ejection fraction, by estimation, is 65 to 70%. The left ventricle has normal function. The left ventricle has no regional wall motion abnormalities. There is mild left ventricular hypertrophy. Left ventricular diastolic parameters are indeterminate.  2. Right ventricular systolic function is normal. The right ventricular size is moderately enlarged. There is moderately elevated pulmonary artery systolic pressure.  3. Left atrial size was severely dilated.  4. Right atrial size was severely dilated.  5. The mitral valve is normal in structure. Mild mitral valve regurgitation. No evidence of mitral  stenosis.  6. Tricuspid valve regurgitation is moderate.  7. The aortic valve is tricuspid. Aortic valve regurgitation is not visualized. Mild to moderate aortic valve stenosis.  8. The inferior vena cava is normal in size with greater than 50% respiratory variability, suggesting right atrial pressure of 3 mmHg. FINDINGS  Left Ventricle: Left ventricular ejection fraction, by estimation, is 65 to 70%. The left ventricle has normal function. The left ventricle has no regional wall motion abnormalities. The left ventricular internal cavity size was normal in size. There is  mild left ventricular hypertrophy. Left ventricular diastolic parameters are indeterminate. Right Ventricle: The right ventricular size is moderately enlarged. No increase in right ventricular wall thickness. Right ventricular systolic function is normal. There is moderately elevated pulmonary artery systolic pressure. The tricuspid regurgitant  velocity is 3.63 m/s, and with an assumed right atrial pressure of 10 mmHg, the estimated right ventricular systolic pressure is 93.2 mmHg. Left Atrium: Left atrial size was severely dilated. Right Atrium: Right atrial size was severely dilated. Pericardium: There is no evidence of pericardial effusion. Mitral Valve: The mitral valve is normal in structure. Mild mitral valve regurgitation. No evidence of mitral valve stenosis. Tricuspid Valve: The tricuspid valve is normal in structure. Tricuspid valve regurgitation is moderate . No evidence of tricuspid stenosis. Aortic Valve: The aortic valve is tricuspid. . There is mild thickening and mild calcification of the aortic valve. Aortic valve regurgitation is not visualized. Mild to moderate aortic stenosis is present. Mild aortic valve annular calcification. There is mild thickening of the aortic valve. There is mild calcification of the aortic valve. Aortic valve mean gradient measures 15.3 mmHg. Aortic valve peak gradient measures 26.7 mmHg. Aortic valve  area, by VTI measures 1.31 cm. Pulmonic Valve: The pulmonic valve was not well visualized. Pulmonic valve regurgitation is not visualized. No evidence of pulmonic stenosis. Aorta: The aortic root is normal in size and structure. Pulmonary Artery: Moderate pulmonary hypertension. Venous: The inferior vena cava is normal in size with greater than 50% respiratory variability, suggesting right atrial pressure of 3 mmHg. IAS/Shunts: No atrial level shunt detected by color flow Doppler.  LEFT VENTRICLE PLAX 2D LVIDd:         5.61 cm  Diastology LVIDs:         3.36 cm  LV e' lateral:   9.36 cm/s LV PW:         1.10 cm  LV E/e' lateral: 11.1 LV IVS:        1.04 cm  LV e' medial:    6.31 cm/s LVOT diam:     2.10 cm  LV E/e' medial:  16.5 LV SV:         72 LV SV Index:   39 LVOT Area:     3.46 cm  RIGHT VENTRICLE RV S prime:  11.40 cm/s TAPSE (M-mode): 1.8 cm LEFT ATRIUM              Index LA diam:        4.70 cm  2.55 cm/m LA Vol (A2C):   83.9 ml  45.60 ml/m LA Vol (A4C):   110.0 ml 59.78 ml/m LA Biplane Vol: 104.0 ml 56.52 ml/m  AORTIC VALVE AV Area (Vmax):    1.32 cm AV Area (Vmean):   1.26 cm AV Area (VTI):     1.31 cm AV Vmax:           258.33 cm/s AV Vmean:          184.000 cm/s AV VTI:            0.549 m AV Peak Grad:      26.7 mmHg AV Mean Grad:      15.3 mmHg LVOT Vmax:         98.80 cm/s LVOT Vmean:        67.133 cm/s LVOT VTI:          0.208 m LVOT/AV VTI ratio: 0.38  AORTA Ao Root diam: 2.80 cm MITRAL VALVE                TRICUSPID VALVE MV Area (PHT): 3.20 cm     TR Peak grad:   52.7 mmHg MV Decel Time: 237 msec     TR Vmax:        363.00 cm/s MR Peak grad: 87.2 mmHg MR Vmax:      467.00 cm/s   SHUNTS MV E velocity: 104.00 cm/s  Systemic VTI:  0.21 m MV A velocity: 68.10 cm/s   Systemic Diam: 2.10 cm MV E/A ratio:  1.53 Carlyle Dolly MD Electronically signed by Carlyle Dolly MD Signature Date/Time: 11/23/2019/4:56:07 PM    Final     Orson Eva, DO  [facility]  If 7PM-7AM, please  contact night-coverage [email or web address] Password [facility] 11/26/2019, 3:42 PM   LOS: 4 days

## 2019-11-26 NOTE — Progress Notes (Signed)
Admit: 11/22/2019 LOS: 4  20F ESRD THS with Afib+RVR, VRE UTI  Subjective:  [name] Kitchen Maintaining rrate control on PO meds . HD 4/10, 4.2L UF . K 4.2 this AM . On 2L [place], BP stable . UCx with VRE, on daptomycin  04/11 0701 - 04/12 0700 In: 289.4 [P.O.:50; I.V.:21.4; IV Piggyback:218.1] Out: 300 [Urine:300]  Filed Weights   11/24/19 1235 11/25/19 0426 11/26/19 0500  Weight: 88.6 kg 86.3 kg 85 kg    Scheduled Meds: . apixaban  5 mg Oral BID  . Chlorhexidine Gluconate Cloth  6 each Topical Daily  . Chlorhexidine Gluconate Cloth  6 each Topical Q0600  . darbepoetin (ARANESP) injection - DIALYSIS  200 mcg Intravenous Q Sat-HD  . diltiazem  240 mg Oral Daily  . DULoxetine  60 mg Oral Daily  . gabapentin  100 mg Oral BID  . heparin  3,000 Units Intracatheter Q T,Th,Sa-HD  . insulin aspart  0-5 Units Subcutaneous QHS  . insulin aspart  0-9 Units Subcutaneous TID WC  . insulin glargine  18 Units Subcutaneous QHS  . levETIRAcetam  250 mg Oral BID  . levothyroxine  50 mcg Oral QAC breakfast  . metoprolol succinate  200 mg Oral Daily  . montelukast  10 mg Oral QHS  . pantoprazole  40 mg Oral Daily  . predniSONE  10 mg Oral QHS  . primidone  50 mg Oral q morning - 10a  . risperiDONE  0.5 mg Oral BID  . traZODone  50 mg Oral QHS   Continuous Infusions: . [START ON 11/27/2019] DAPTOmycin (CUBICIN)  IV    . diltiazem (CARDIZEM) infusion Stopped (11/25/19 1016)  . ferric gluconate (FERRLECIT/NULECIT) IV Stopped (11/25/19 1150)   PRN Meds:.acetaminophen **OR** acetaminophen, heparin, ondansetron **OR** ondansetron (ZOFRAN) IV, polyethylene glycol  Current Labs: reviewed    Physical Exam:  Blood pressure 117/87, pulse 83, temperature 97.9 F (36.6 C), temperature source Oral, resp. rate 20, height 5' (1.524 m), weight 85 kg, SpO2 100 %. NAD, eating breakfast IRIR, nl rate No sig LEE R IJ TDC, bandaged CTAB, ant S/nt nonfocal Maturing LUE AVF +B/T   Dialyzes at [facility]- TTS-  4  hous  EDW 87.5. HD Bath 2/2.5, Dialyzer unknown, Heparin yes- 1500 per tx. Access TDC. S/p AVF placed 09/27/19  A 1. ESRD THS [facility] R IJ TDC 2. AFib + RVR, now on PO rate control, stable, on DOAC 3. VRE UTI on dapto 4. Anemia: cont ESA 5. CKD-BMDCa ok, P close, outpt mgmt 6. HTN/Vol: UF as able, BP stable.    P . HD tomorrow: 3-4L UF, TDC, 2K, 4h, Tight Heparin,  . Medication Issues; o Preferred narcotic agents for pain control are hydromorphone, fentanyl, and methadone. Morphine should not be used.  o Baclofen should be avoided o Avoid oral sodium phosphate and magnesium citrate based laxatives / bowel preps    Pearson Grippe MD 11/26/2019, 9:19 AM  Recent Labs  Lab 11/24/19 0441 11/25/19 0416 11/26/19 0407  NA 139 136 133*  K 3.9 3.9 4.2  CL 102 100 97*  CO2 24 27 23   GLUCOSE 157* 166* 169*  BUN 52* 24* 40*  CREATININE 4.38* 2.77* 3.81*  CALCIUM 8.8* 8.6* 8.6*  PHOS 6.4* 4.1 6.3*   Recent Labs  Lab 11/22/19 1418 11/23/19 0458 11/24/19 0440  WBC 7.9 9.8 8.5  NEUTROABS 7.0  --   --   HGB 10.3* 10.0* 10.1*  HCT 32.1* 32.1* 32.8*  MCV 103.9* 105.6* 105.5*  PLT  231 196 198             

## 2019-11-26 NOTE — Progress Notes (Signed)
Progress Note  Patient Name: [name] Date of Encounter: 11/26/2019  Primary Cardiologist: Rozann Lesches, MD  Subjective   Eating breakfast.  No chest pains or palpitations at rest.  No abdominal pain.  Inpatient Medications    Scheduled Meds: . apixaban  5 mg Oral BID  . Chlorhexidine Gluconate Cloth  6 each Topical Daily  . Chlorhexidine Gluconate Cloth  6 each Topical Q0600  . darbepoetin (ARANESP) injection - DIALYSIS  200 mcg Intravenous Q Sat-HD  . diltiazem  240 mg Oral Daily  . DULoxetine  60 mg Oral Daily  . gabapentin  100 mg Oral BID  . heparin  3,000 Units Intracatheter Q T,Th,Sa-HD  . insulin aspart  0-5 Units Subcutaneous QHS  . insulin aspart  0-9 Units Subcutaneous TID WC  . insulin glargine  18 Units Subcutaneous QHS  . levETIRAcetam  250 mg Oral BID  . levothyroxine  50 mcg Oral QAC breakfast  . metoprolol succinate  200 mg Oral Daily  . montelukast  10 mg Oral QHS  . pantoprazole  40 mg Oral Daily  . predniSONE  10 mg Oral QHS  . primidone  50 mg Oral q morning - 10a  . risperiDONE  0.5 mg Oral BID  . traZODone  50 mg Oral QHS   Continuous Infusions: . [START ON 11/27/2019] DAPTOmycin (CUBICIN)  IV    . diltiazem (CARDIZEM) infusion Stopped (11/25/19 1016)  . ferric gluconate (FERRLECIT/NULECIT) IV Stopped (11/25/19 1150)   PRN Meds: acetaminophen **OR** acetaminophen, heparin, ondansetron **OR** ondansetron (ZOFRAN) IV, polyethylene glycol   Vital Signs    Vitals:   11/26/19 0500 11/26/19 0600 11/26/19 0700 11/26/19 0800  BP: 112/64 127/64 119/74 117/87  Pulse: 88 (!) 39 (!) 113 83  Resp: 19 18 (!) 21 20  Temp:      TempSrc:      SpO2: 100% 99% 100% 100%  Weight: 85 kg     Height:        Intake/Output Summary (Last 24 hours) at 11/26/2019 0854 Last data filed at 11/25/2019 2000 Gross per 24 hour  Intake 289.44 ml  Output 300 ml  Net -10.56 ml   Filed Weights   11/24/19 1235 11/25/19 0426 11/26/19 0500  Weight: 88.6 kg 86.3  kg 85 kg    Telemetry    Rate controlled atrial fibrillation.  Personally reviewed.  ECG    An ECG dated 11/24/2019 was personally reviewed today and demonstrated:  Probable atypical atrial flutter with variable conduction, right bundle branch block, left anterior fascicular block.  Physical Exam   GEN: No acute distress.   Neck: No JVD. Cardiac:  Irregularly irregular, 2/6 systolic murmur. Respiratory: Nonlabored. Clear to auscultation bilaterally. GI: Soft, nontender, bowel sounds present. MS: No edema; No deformity. Neuro:  Nonfocal. Psych: Alert and oriented x 3.  Labs    Chemistry Recent Labs  Lab 11/22/19 1418 11/23/19 0458 11/24/19 0441 11/25/19 0416 11/26/19 0407  NA 135   < > 139 136 133*  K 3.7   < > 3.9 3.9 4.2  CL 99   < > 102 100 97*  CO2 26   < > 24 27 23   GLUCOSE 211*   < > 157* 166* 169*  BUN 36*   < > 52* 24* 40*  CREATININE 2.86*   < > 4.38* 2.77* 3.81*  CALCIUM 8.2*   < > 8.8* 8.6* 8.6*  PROT 5.9*  --   --   --   --  ALBUMIN 3.1*  --  2.8* 2.8* 2.8*  AST 32  --   --   --   --   ALT 25  --   --   --   --   ALKPHOS 59  --   --   --   --   BILITOT 0.7  --   --   --   --   GFRNONAA 16*   < > 10* 17* 11*  GFRAA 19*   < > 11* 20* 13*  ANIONGAP 10   < > 13 9 13    < > = values in this interval not displayed.     Hematology Recent Labs  Lab 11/22/19 1418 11/23/19 0458 11/24/19 0440  WBC 7.9 9.8 8.5  RBC 3.09* 3.04* 3.11*  HGB 10.3* 10.0* 10.1*  HCT 32.1* 32.1* 32.8*  MCV 103.9* 105.6* 105.5*  MCH 33.3 32.9 32.5  MCHC 32.1 31.2 30.8  RDW 16.6* 17.0* 16.9*  PLT 231 196 198    Cardiac Enzymes Recent Labs  Lab 11/22/19 1434 11/22/19 1627 11/22/19 1843 11/22/19 2137  TROPONINIHS 40* 81* 181* 380*    BNP Recent Labs  Lab 11/22/19 1418  BNP 1,131.0*     Radiology    No results found.  Cardiac Studies   Echocardiogram 11/23/2019: 1. Left ventricular ejection fraction, by estimation, is 65 to 70%. The  left ventricle has  normal function. The left ventricle has no regional  wall motion abnormalities. There is mild left ventricular hypertrophy.  Left ventricular diastolic parameters  are indeterminate.  2. Right ventricular systolic function is normal. The right ventricular  size is moderately enlarged. There is moderately elevated pulmonary artery  systolic pressure.  3. Left atrial size was severely dilated.  4. Right atrial size was severely dilated.  5. The mitral valve is normal in structure. Mild mitral valve  regurgitation. No evidence of mitral stenosis.  6. Tricuspid valve regurgitation is moderate.  7. The aortic valve is tricuspid. Aortic valve regurgitation is not  visualized. Mild to moderate aortic valve stenosis.  8. The inferior vena cava is normal in size with greater than 50%  respiratory variability, suggesting right atrial pressure of 3 mmHg.   Assessment & Plan    1.  Persistent atypical atrial flutter and atrial fibrillation, CHA2DS2-VASc score is 5.  Focus is on heart rate control and anticoagulation.  She is on Eliquis, Cardizem CD, and Toprol-XL.  She does not report any active palpitations at this time and is otherwise hemodynamically stable.  2.  History of chronic diastolic heart failure.  Follow-up echocardiogram on April 9 revealed LVEF 65 to 70% with indeterminate diastolic parameters, moderately dilated right ventricle with moderate pulmonary hypertension.  Fluid status is managed via hemodialysis at this point.  3.  ESRD on hemodialysis.  Nephrology following.  4.  VRE UTI.  I reviewed the consultation note by Dr. Harl Bowie and subsequent hospital course.  Agree with current cardiac medical regimen including Eliquis, Cardizem CD which was uptitrated over the weekend, and Toprol-XL.  Would focus on heart rate control rather than rhythm control.  She could likely be transferred out to telemetry.  Signed, Rozann Lesches, MD  11/26/2019, 8:54 AM

## 2019-11-27 DIAGNOSIS — N186 End stage renal disease: Secondary | ICD-10-CM | POA: Diagnosis not present

## 2019-11-27 DIAGNOSIS — I5032 Chronic diastolic (congestive) heart failure: Secondary | ICD-10-CM | POA: Diagnosis not present

## 2019-11-27 DIAGNOSIS — R000 Tachycardia, unspecified: Secondary | ICD-10-CM | POA: Diagnosis not present

## 2019-11-27 DIAGNOSIS — I4819 Other persistent atrial fibrillation: Secondary | ICD-10-CM | POA: Diagnosis not present

## 2019-11-27 DIAGNOSIS — I4891 Unspecified atrial fibrillation: Secondary | ICD-10-CM | POA: Diagnosis not present

## 2019-11-27 DIAGNOSIS — I10 Essential (primary) hypertension: Secondary | ICD-10-CM | POA: Diagnosis not present

## 2019-11-27 DIAGNOSIS — L03115 Cellulitis of right lower limb: Secondary | ICD-10-CM | POA: Diagnosis not present

## 2019-11-27 DIAGNOSIS — I484 Atypical atrial flutter: Secondary | ICD-10-CM | POA: Diagnosis not present

## 2019-11-27 LAB — RENAL FUNCTION PANEL
Albumin: 2.8 g/dL — ABNORMAL LOW (ref 3.5–5.0)
Anion gap: 12 (ref 5–15)
BUN: 56 mg/dL — ABNORMAL HIGH (ref 8–23)
CO2: 23 mmol/L (ref 22–32)
Calcium: 8.6 mg/dL — ABNORMAL LOW (ref 8.9–10.3)
Chloride: 95 mmol/L — ABNORMAL LOW (ref 98–111)
Creatinine, Ser: 4.83 mg/dL — ABNORMAL HIGH (ref 0.44–1.00)
GFR calc Af Amer: 10 mL/min — ABNORMAL LOW (ref >60)
GFR calc non Af Amer: 9 mL/min — ABNORMAL LOW (ref >60)
Glucose, Bld: 182 mg/dL — ABNORMAL HIGH (ref 70–99)
Phosphorus: 7.8 mg/dL — ABNORMAL HIGH (ref 2.5–4.6)
Potassium: 4.8 mmol/L (ref 3.5–5.1)
Sodium: 130 mmol/L — ABNORMAL LOW (ref 135–145)

## 2019-11-27 LAB — CBC
HCT: 31.3 % — ABNORMAL LOW (ref 36.0–46.0)
Hemoglobin: 9.8 g/dL — ABNORMAL LOW (ref 12.0–15.0)
MCH: 32.8 pg (ref 26.0–34.0)
MCHC: 31.3 g/dL (ref 30.0–36.0)
MCV: 104.7 fL — ABNORMAL HIGH (ref 80.0–100.0)
Platelets: 173 10*3/uL (ref 150–400)
RBC: 2.99 MIL/uL — ABNORMAL LOW (ref 3.87–5.11)
RDW: 15.9 % — ABNORMAL HIGH (ref 11.5–15.5)
WBC: 8.1 10*3/uL (ref 4.0–10.5)
nRBC: 0 % (ref 0.0–0.2)

## 2019-11-27 LAB — GLUCOSE, CAPILLARY
Glucose-Capillary: 181 mg/dL — ABNORMAL HIGH (ref 70–99)
Glucose-Capillary: 168 mg/dL — ABNORMAL HIGH (ref 70–99)

## 2019-11-27 MED ORDER — METOPROLOL SUCCINATE ER 200 MG PO TB24: 200.0000 mg | ORAL_TABLET | Freq: Every day | ORAL | Status: DC

## 2019-11-27 MED ORDER — DILTIAZEM HCL ER COATED BEADS 240 MG PO CP24: 240.0000 mg | ORAL_CAPSULE | Freq: Every day | ORAL | Status: DC

## 2019-11-27 MED ORDER — LINEZOLID 600 MG PO TABS: 600.0000 mg | ORAL_TABLET | Freq: Two times a day (BID) | ORAL | 0 refills | Status: DC

## 2019-11-27 NOTE — Progress Notes (Signed)
Admit: 11/22/2019 LOS: 5  75F ESRD THS with Afib+RVR, VRE UTI  Subjective:  . No c/o   04/12 0701 - 04/13 0700 In: 240 [P.O.:240] Out: 400 [Urine:400]  Filed Weights   11/25/19 0426 11/26/19 0500 11/27/19 0500  Weight: 86.3 kg 85 kg 88.2 kg    Scheduled Meds: . apixaban  5 mg Oral BID  . Chlorhexidine Gluconate Cloth  6 each Topical Daily  . Chlorhexidine Gluconate Cloth  6 each Topical Q0600  . darbepoetin (ARANESP) injection - DIALYSIS  200 mcg Intravenous Q Sat-HD  . diltiazem  240 mg Oral Daily  . DULoxetine  60 mg Oral Daily  . gabapentin  100 mg Oral BID  . heparin  3,000 Units Intracatheter Q T,Th,Sa-HD  . insulin aspart  0-5 Units Subcutaneous QHS  . insulin aspart  0-9 Units Subcutaneous TID WC  . insulin glargine  18 Units Subcutaneous QHS  . levETIRAcetam  250 mg Oral BID  . levothyroxine  50 mcg Oral QAC breakfast  . metoprolol succinate  200 mg Oral Daily  . montelukast  10 mg Oral QHS  . pantoprazole  40 mg Oral Daily  . predniSONE  10 mg Oral QHS  . primidone  50 mg Oral q morning - 10a  . risperiDONE  0.5 mg Oral BID  . traZODone  50 mg Oral QHS   Continuous Infusions: . DAPTOmycin (CUBICIN)  IV    . diltiazem (CARDIZEM) infusion Stopped (11/25/19 1016)  . ferric gluconate (FERRLECIT/NULECIT) IV 125 mg (11/26/19 1030)   PRN Meds:.acetaminophen **OR** acetaminophen, heparin, ondansetron **OR** ondansetron (ZOFRAN) IV, polyethylene glycol  Current Labs: reviewed    Physical Exam:  Blood pressure (!) 112/53, pulse 66, temperature 97.6 F (36.4 C), temperature source Oral, resp. rate (!) 22, height 5' (1.524 m), weight 88.2 kg, SpO2 97 %. NAD, eating breakfast IRIR, nl rate No sig LEE R IJ TDC, bandaged CTAB, ant S/nt nonfocal Maturing LUE AVF +B/T   Dialyzes at [facility]- TTS-  4 hous  EDW 87.5. HD Bath 2/2.5, Dialyzer unknown, Heparin yes- 1500 per tx. Access TDC. S/p AVF placed 09/27/19  A 1. ESRD THS [facility] R IJ TDC 2. AFib + RVR,  now on PO rate control, stable, on DOAC 3. VRE UTI on dapto, transition to linezolid at DC 4. Anemia: cont ESA 5. CKD-BMDCa ok, HyperP, outpt mgmt 6. HTN/Vol: UF as able, BP stable.    P . HD today: 3-4L UF, TDC, 2K, 4h, Tight Heparin,  . Medication Issues; o Preferred narcotic agents for pain control are hydromorphone, fentanyl, and methadone. Morphine should not be used.  o Baclofen should be avoided o Avoid oral sodium phosphate and magnesium citrate based laxatives / bowel preps    Pearson Grippe MD 11/27/2019, 9:20 AM  Recent Labs  Lab 11/25/19 0416 11/26/19 0407 11/27/19 0548  NA 136 133* 130*  K 3.9 4.2 4.8  CL 100 97* 95*  CO2 27 23 23   GLUCOSE 166* 169* 182*  BUN 24* 40* 56*  CREATININE 2.77* 3.81* 4.83*  CALCIUM 8.6* 8.6* 8.6*  PHOS 4.1 6.3* 7.8*   Recent Labs  Lab 11/22/19 1418 11/22/19 1418 11/23/19 0458 11/24/19 0440 11/27/19 0548  WBC 7.9   < > 9.8 8.5 8.1  NEUTROABS 7.0  --   --   --   --   HGB 10.3*   < > 10.0* 10.1* 9.8*  HCT 32.1*   < > 32.1* 32.8* 31.3*  MCV 103.9*   < >  105.6* 105.5* 104.7*  PLT 231   < > 196 198 173   < > = values in this interval not displayed.

## 2019-11-27 NOTE — TOC Transition Note (Signed)
Transition of Care [facility]) - CM/SW Discharge Note   Patient Details  Name: [name] MRN: [id number] Date of Birth: 26-Feb-1951  Transition of Care [facility]) CM/SW Contact:  Boneta Lucks, RN Phone Number: 11/27/2019, 10:39 AM   Clinical Narrative:   Patient medically ready to return to [facility]. TOC spoke with Hailey, DC summary sent in the hub. RN to call report, TOC scheduled EMS and printed medical necessity. TOC unable to reach family with update on discharge.     Final next level of care: Skilled Nursing Facility Barriers to Discharge: Barriers Resolved   Patient Goals and CMS Choice Patient states their goals for this hospitalization and ongoing recovery are:: to go back to SNF CMS Medicare.gov Compare Post Acute Care list provided to:: Patient Represenative (must comment)    Discharge Placement                Patient to be transferred to facility by: EMS   Patient and family notified of of transfer: 11/27/19  Discharge Plan and [place]  Readmission Risk Interventions Readmission Risk Prevention Plan 09/10/2019 11/29/2018  Transportation Screening Complete Complete  Medication Review ([place]) Complete Complete  PCP or Specialist appointment within 3-5 days of discharge - Not Complete  PCP/Specialist Appt Not Complete comments - earliest appt available was 12/08/18  Airport or Home Care Consult - Complete  SW Recovery Care/Counseling Consult - Complete  [place] - Not Applicable  Some recent data might be hidden

## 2019-11-27 NOTE — Procedures (Signed)
    HEMODIALYSIS TREATMENT NOTE:  4 hour low-heparin dialysis completed via [facility] TDC.  Goal met: 3.5 liters removed without interruption in UF.  Hemodynamically stable throughout session.  Cubicin IV was given during last 30 minutes of treatment.  All blood was returned.   , RN 

## 2019-11-27 NOTE — Progress Notes (Signed)
Progress Note  Patient Name: [name] Date of Encounter: 11/27/2019  Primary Cardiologist: Rozann Lesches, MD  Interval hospital course reviewed since assessment yesterday.  Patient remains in atrial fibrillation with controlled heart rate on oral regimen including Cardizem CD and Eliquis.  I reviewed with nursing, patient scheduled for hemodialysis today and then likely transfer to SNF.  Inpatient Medications    Scheduled Meds: . apixaban  5 mg Oral BID  . Chlorhexidine Gluconate Cloth  6 each Topical Daily  . Chlorhexidine Gluconate Cloth  6 each Topical Q0600  . darbepoetin (ARANESP) injection - DIALYSIS  200 mcg Intravenous Q Sat-HD  . diltiazem  240 mg Oral Daily  . DULoxetine  60 mg Oral Daily  . gabapentin  100 mg Oral BID  . heparin  3,000 Units Intracatheter Q T,Th,Sa-HD  . insulin aspart  0-5 Units Subcutaneous QHS  . insulin aspart  0-9 Units Subcutaneous TID WC  . insulin glargine  18 Units Subcutaneous QHS  . levETIRAcetam  250 mg Oral BID  . levothyroxine  50 mcg Oral QAC breakfast  . metoprolol succinate  200 mg Oral Daily  . montelukast  10 mg Oral QHS  . pantoprazole  40 mg Oral Daily  . predniSONE  10 mg Oral QHS  . primidone  50 mg Oral q morning - 10a  . risperiDONE  0.5 mg Oral BID  . traZODone  50 mg Oral QHS   Continuous Infusions: . DAPTOmycin (CUBICIN)  IV    . diltiazem (CARDIZEM) infusion Stopped (11/25/19 1016)  . ferric gluconate (FERRLECIT/NULECIT) IV 125 mg (11/26/19 1030)   PRN Meds: acetaminophen **OR** acetaminophen, heparin, ondansetron **OR** ondansetron (ZOFRAN) IV, polyethylene glycol   Vital Signs    Vitals:   11/27/19 0300 11/27/19 0500 11/27/19 0502 11/27/19 0700  BP:   (!) 112/53   Pulse: 70  76 66  Resp: (!) 22  (!) 23 (!) 22  Temp:   97.6 F (36.4 C)   TempSrc:   Oral   SpO2: 95%  97% 97%  Weight:  88.2 kg    Height:        Intake/Output Summary (Last 24 hours) at 11/27/2019 0828 Last data filed at 11/27/2019  0500 Gross per 24 hour  Intake 240 ml  Output 400 ml  Net -160 ml   Filed Weights   11/25/19 0426 11/26/19 0500 11/27/19 0500  Weight: 86.3 kg 85 kg 88.2 kg    Telemetry    Rate controlled atrial fibrillation.  Personally reviewed.  Labs    Chemistry Recent Labs  Lab 11/22/19 1418 11/23/19 0458 11/25/19 0416 11/26/19 0407 11/27/19 0548  NA 135   < > 136 133* 130*  K 3.7   < > 3.9 4.2 4.8  CL 99   < > 100 97* 95*  CO2 26   < > 27 23 23   GLUCOSE 211*   < > 166* 169* 182*  BUN 36*   < > 24* 40* 56*  CREATININE 2.86*   < > 2.77* 3.81* 4.83*  CALCIUM 8.2*   < > 8.6* 8.6* 8.6*  PROT 5.9*  --   --   --   --   ALBUMIN 3.1*   < > 2.8* 2.8* 2.8*  AST 32  --   --   --   --   ALT 25  --   --   --   --   ALKPHOS 59  --   --   --   --  BILITOT 0.7  --   --   --   --   GFRNONAA 16*   < > 17* 11* 9*  GFRAA 19*   < > 20* 13* 10*  ANIONGAP 10   < > 9 13 12    < > = values in this interval not displayed.     Hematology Recent Labs  Lab 11/23/19 0458 11/24/19 0440 11/27/19 0548  WBC 9.8 8.5 8.1  RBC 3.04* 3.11* 2.99*  HGB 10.0* 10.1* 9.8*  HCT 32.1* 32.8* 31.3*  MCV 105.6* 105.5* 104.7*  MCH 32.9 32.5 32.8  MCHC 31.2 30.8 31.3  RDW 17.0* 16.9* 15.9*  PLT 196 198 173    Cardiac Enzymes Recent Labs  Lab 11/22/19 1434 11/22/19 1627 11/22/19 1843 11/22/19 2137  TROPONINIHS 40* 81* 181* 380*    BNP Recent Labs  Lab 11/22/19 1418  BNP 1,131.0*     Cardiac Studies   Echocardiogram 11/23/2019: 1. Left ventricular ejection fraction, by estimation, is 65 to 70%. The  left ventricle has normal function. The left ventricle has no regional  wall motion abnormalities. There is mild left ventricular hypertrophy.  Left ventricular diastolic parameters  are indeterminate.  2. Right ventricular systolic function is normal. The right ventricular  size is moderately enlarged. There is moderately elevated pulmonary artery  systolic pressure.  3. Left atrial size was  severely dilated.  4. Right atrial size was severely dilated.  5. The mitral valve is normal in structure. Mild mitral valve  regurgitation. No evidence of mitral stenosis.  6. Tricuspid valve regurgitation is moderate.  7. The aortic valve is tricuspid. Aortic valve regurgitation is not  visualized. Mild to moderate aortic valve stenosis.  8. The inferior vena cava is normal in size with greater than 50%  respiratory variability, suggesting right atrial pressure of 3 mmHg.   Assessment & Plan    1.  Persistent atypical atrial flutter and atrial fibrillation, CHA2DS2-VASc score is 5.  Heart rate remains controlled on the current AV nodal blocker regimen along with Eliquis for stroke prophylaxis.  2.  History of chronic diastolic heart failure.  Follow-up echocardiogram on April 9 revealed LVEF 65 to 70% with indeterminate diastolic parameters, moderately dilated right ventricle with moderate pulmonary hypertension.  Fluid status is managed via hemodialysis at this point.  3.  ESRD on hemodialysis.  Nephrology following, plan for session today.   [facility] will sign off.   Medication Recommendations:   Eliquis 5 mg twice daily, Cardizem CD 240 mg daily, Toprol-XL 200 mg daily Other recommendations (labs, testing, etc):  No further cardiac testing planned at this time Follow up as an outpatient:   We will schedule a virtual encounter with patient at SNF in the next 4 to 6 weeks  Signed, Rozann Lesches, MD  11/27/2019, 8:28 AM

## 2019-11-28 LAB — CULTURE, BLOOD (ROUTINE X 2)
Culture: NO GROWTH
Culture: NO GROWTH
Special Requests: ADEQUATE
Special Requests: ADEQUATE

## 2019-12-24 NOTE — Progress Notes (Deleted)
{Choose 1 Note Type (Video or Telephone):[phone]}   The patient was identified using 2 identifiers.  Date:  12/24/2019   ID:  [name], DOB 1950/09/20, MRN [id number]  {Patient Location:[phone]::"Home"} {Provider Location:[phone]::"Home"}  PCP:  Glenda Chroman, MD  Cardiologist:  Rozann Lesches, MD *** Electrophysiologist:  None   Evaluation Performed:  {Choose Visit [phone]::"Follow-Up Visit"}  Chief Complaint:  ***  History of Present Illness:    [name] is a 69 y.o. female with past medical history of chronic diastolic CHF, coronary calcifications by CT, HTN, HLD, Type 2 DM, COPD, ESRD and Schizophrenia who presents for a hospital follow-up telehealth visit.   She was recently admitted to [facility] last month for evaluation of palpitations and was found to be in atrial flutter with RVR. Given her multiple medical issues and asymptomatic state, a rate-control strategy was initially pursued and she was transitioned to Cardizem CD 240mg  daily and Toprol-XL 200mg  daily for rate-control along with being started on Eliquis for anticoagulation.   The patient {does/does not:[phone]} have symptoms concerning for COVID-19 infection (fever, chills, cough, or new shortness of breath).    Past Medical History:  Diagnosis Date  . Anxiety   . Asthma   . Back pain   . Barrett esophagus   . CHF (congestive heart failure) ([place])   . CKD (chronic kidney disease) stage 3, GFR 30-59 ml/min   . COPD (chronic obstructive pulmonary disease) ([place])   . Depression   . Diastolic heart failure ([place])   . Essential hypertension   . Fibromyalgia   . GERD (gastroesophageal reflux disease)   . Gout   . History of pericarditis   . Hyperlipidemia   . Hypothyroid   . Major depressive disorder   . Memory loss   . Nephrolithiasis    2008  . Obesity hypoventilation syndrome (HCC)    CPAP  . Obstructive sleep apnea   . PE (pulmonary embolism)    2010  . Peripheral  neuropathy   . PTSD (post-traumatic stress disorder)   . Rheumatoid arthritis ([place])   . Schizophrenia ([place])   . 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 Seizures ([place])   . Steroid dependence ([place])   . Type 2 diabetes mellitus ([place])   . Vitamin D deficiency    Past Surgical History:  Procedure Laterality Date  . APPENDECTOMY    . AV FISTULA PLACEMENT Right 09/27/2019   Procedure: ARTERIOVENOUS (AV) FISTULA CREATION;  Surgeon: Waynetta Sandy, MD;  Location: [place];  Service: Vascular;  Laterality: Right;  . CHOLECYSTECTOMY    . COLONOSCOPY     benign polyps (12/2000), repeat colonoscopy performed in 2007  . ENDOMETRIAL BIOPSY     10/03 benign columnar mucosa (limited material)  . INSERTION OF DIALYSIS CATHETER Right 09/14/2019   Procedure: ATTEMPTED INSERTION OF DIALYSIS CATHETER;  Surgeon: Virl Cagey, MD;  [facility];  Service: General;  Laterality: Right;  . INSERTION OF DIALYSIS CATHETER Right 09/27/2019   Procedure: INSERTION OF DIALYSIS CATHETER;  Surgeon: Waynetta Sandy, MD;  Location: [employer];  Service: Vascular;  Laterality: Right;  . KIDNEY SURGERY       No outpatient medications have been marked as taking for the 12/25/19 encounter (Appointment) with Erma Heritage, PA-C.     Allergies:   Benztropine mesylate, Codeine, Diazepam, Diphenhydramine hcl, Penicillins, Sulfonamide derivatives, Thioridazine hcl, and Lisinopril   Social History   Tobacco Use  . Smoking status: Former Smoker    Types: Cigarettes  Quit date: 06/23/1972    Years since quitting: 47.5  . Smokeless tobacco: Never Used  Substance Use Topics  . Alcohol use: No  . Drug use: No     Family Hx: The patient's family history includes Bone cancer in her mother; COPD in her father; Heart disease in her father and mother; Hypertension in her mother; Stroke in her father.  ROS:   Please see the history of present illness.    *** All other systems reviewed and are  negative.   Prior CV studies:   The following studies were reviewed today:  Echocardiogram: 11/2019 IMPRESSIONS    1. Left ventricular ejection fraction, by estimation, is 65 to 70%. The  left ventricle has normal function. The left ventricle has no regional  wall motion abnormalities. There is mild left ventricular hypertrophy.  Left ventricular diastolic parameters  are indeterminate.  2. Right ventricular systolic function is normal. The right ventricular  size is moderately enlarged. There is moderately elevated pulmonary artery  systolic pressure.  3. Left atrial size was severely dilated.  4. Right atrial size was severely dilated.  5. The mitral valve is normal in structure. Mild mitral valve  regurgitation. No evidence of mitral stenosis.  6. Tricuspid valve regurgitation is moderate.  7. The aortic valve is tricuspid. Aortic valve regurgitation is not  visualized. Mild to moderate aortic valve stenosis.  8. The inferior vena cava is normal in size with greater than 50%  respiratory variability, suggesting right atrial pressure of 3 mmHg.   Labs/Other Tests and Data Reviewed:    EKG:  An ECG dated 11/24/2019 was personally reviewed today and demonstrated: Atrial fibrillation with RVR, HR 117 with RBBB and LAFB.   Recent Labs: 11/22/2019: ALT 25; B Natriuretic Peptide 1,131.0; Magnesium 1.8; TSH 2.274 11/27/2019: BUN 56; Creatinine, Ser 4.83; Hemoglobin 9.8; Platelets 173; Potassium 4.8; Sodium 130   Recent Lipid Panel Lab Results  Component Value Date/Time   CHOL 160 09/16/2018 02:06 AM   TRIG 255 (H) 09/16/2018 02:06 AM   HDL 31 (L) 09/16/2018 02:06 AM   CHOLHDL 5.2 09/16/2018 02:06 AM   LDLCALC 78 09/16/2018 02:06 AM   LDLDIRECT 96 02/28/2008 08:44 PM    Wt Readings from Last 3 Encounters:  11/27/19 194 lb 10.7 oz (88.3 kg)  11/02/19 200 lb (90.7 kg)  10/21/19 200 lb (90.7 kg)     Objective:    Vital Signs:  There were no vitals taken for this  visit.   {HeartCare Virtual Exam (Optional):[phone]::"VITAL SIGNS:  reviewed"}  ASSESSMENT & PLAN:    1. ***  COVID-19 Education: The signs and symptoms of COVID-19 were discussed with the patient and how to seek care for testing (follow up with PCP or arrange E-visit).  ***The importance of social distancing was discussed today.  Time:   Today, I have spent *** minutes with the patient with telehealth technology discussing the above problems.     Medication Adjustments/Labs and Tests Ordered: Current medicines are reviewed at length with the patient today.  Concerns regarding medicines are outlined above.   Tests Ordered: No orders of the defined types were placed in this encounter.   Medication Changes: No orders of the defined types were placed in this encounter.   Follow Up:  {F/U Format:[phone]} {follow up:15908}  Signed, Erma Heritage, PA-C  12/24/2019 4:29 PM    [facility]

## 2019-12-25 ENCOUNTER — Encounter: Payer: Medicare Other | Admitting: Student

## 2019-12-26 NOTE — Progress Notes (Signed)
This encounter was created in error - please disregard.

## 2020-01-03 ENCOUNTER — Encounter: Payer: Self-pay | Admitting: Physician Assistant

## 2020-01-03 NOTE — Progress Notes (Signed)
Virtual Visit via Telephone Note   This visit type was conducted due to national recommendations for restrictions regarding the COVID-19 Pandemic (e.g. social distancing) in an effort to limit this patient's exposure and mitigate transmission in our community.  Due to her co-morbid illnesses, this patient is at least at moderate risk for complications without adequate follow up.  This format is felt to be most appropriate for this patient at this time.  The patient did not have access to video technology/had technical difficulties with video requiring transitioning to audio format only (telephone).  All issues noted in this document were discussed and addressed.  No physical exam could be performed with this format.  Please refer to the patient's chart for her  consent to telehealth for [facility]. The patient was identified using 2 identifiers.  Date:  01/04/2020   ID:  [name], DOB [birth year], MRN [id number]  Patient Location: Other:  [name] Provider Location: Home  PCP:  Glenda Chroman, MD  Cardiologist:  Rozann Lesches, MD  Electrophysiologist:  None   Evaluation Performed:  Follow-Up Visit  Chief Complaint:  F/u atrial fib/flutter  History of Present Illness:    [name] is a 69 y.o. female with chronic diastolic HF, persistent atrial fibrillation/flutter, anemia, mild-moderate aortic stenosis, ESRD on HD, HTN, HLD, bifascicular block (RBBB+LAFB), COPD, DM2, hx of PE, seizure disorder, schizophrenia, Barrett's esophagus, memory loss, OSA who presents for virtual follow-up. She was remotely seen by cardiology for diastolic CHF, complicated by CKD. Notes also outline h/o schizoaffective disorder and also possible seizures versus catatonia. She was admitted in January-February 2021 with respiratory failure/PNA and progressed to needing dialysis. During admission she was reported to have new onset atrial fibrillation (later deemed flutter per cardiology note) and was  placed on Eliquis. She was readmitted 11/2019 with racing heart, found to have SVT vs atrial flutter, then interim conversion to NSR. Her carvedilol was changed to metoprolol. She also had elevated troponin in setting of severe tachycardia, ESRD, fluid overload, which was not felt to represent ACS. She required addition of diltiazem. The last note indicates she went into atrial fibrillation again and rate control strategy was employed. HR was in the 70s at time of discharge. 2D Echo 11/2019 showed EF 65-70%, mild LVH, moderate pulmonary HTN, severe biatrial enlargement, mild-moderate aortic stenosis and moderate tricuspid regurgitation. Last labs personally reviewed 11/2019 showed Hgb 9.8 (similar to previous values), sodium 130, potassium 4.8, albumin 2.8, TSH wnl, Mg 1.8.  Today's visit was a virtual visit. [facility], patient's nurse, helped assist with the call today. She was with [name] at bedside who does not offer much by way of conversation. She denies any complaints and is feeling fine. No CP, fever, chills, SOB, palpitations or any symptoms at this time. [name] reports there is no swelling. The patient gets dialysis Tuesday/Thursday/Saturday. Looking through vital signs [name] reports the patient's HR has been extremely variable. The lowest they saw was 70bpm on 5/12. It has run 88-118 today. It is currently 111 with pulse ox of 100% on RA. She is on metoprolol 200mg  daily and diltiazem 240mg  daily. Blood pressure is elevated today but is also labile, lowest recently was 108/58 and 112/60.   Past Medical History:  Diagnosis Date  . Anxiety   . Asthma   . Atrial flutter ([place])   . Back pain   . Barrett esophagus   . Bifascicular block   . CHF (congestive heart failure) ([place])   . Chronic  diastolic CHF (congestive heart failure) ([place])   . COPD (chronic obstructive pulmonary disease) ([place])   . Depression   . ESRD on hemodialysis ([place])   . Essential hypertension   . Fibromyalgia   . GERD  (gastroesophageal reflux disease)   . Gout   . History of pericarditis   . Hyperlipidemia   . Hypothyroid   . Major depressive disorder   . Memory loss   . Nephrolithiasis    2008  . Obesity hypoventilation syndrome (HCC)    CPAP  . Obstructive sleep apnea   . PE (pulmonary embolism)    2010  . Peripheral neuropathy   . Persistent atrial fibrillation ([place])   . PTSD (post-traumatic stress disorder)   . Pulmonary hypertension ([place])   . Rheumatoid arthritis ([place])   . Schizophrenia ([place])   . 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 Seizures ([place])   . Steroid dependence ([place])   . Type 2 diabetes mellitus ([place])   . Vitamin D deficiency    Past Surgical History:  Procedure Laterality Date  . APPENDECTOMY    . AV FISTULA PLACEMENT Right 09/27/2019   Procedure: ARTERIOVENOUS (AV) FISTULA CREATION;  Surgeon: Waynetta Sandy, MD;  Location: [place];  Service: Vascular;  Laterality: Right;  . CHOLECYSTECTOMY    . COLONOSCOPY     benign polyps (12/2000), repeat colonoscopy performed in 2007  . ENDOMETRIAL BIOPSY     10/03 benign columnar mucosa (limited material)  . INSERTION OF DIALYSIS CATHETER Right 09/14/2019   Procedure: ATTEMPTED INSERTION OF DIALYSIS CATHETER;  Surgeon: Virl Cagey, MD;  Location: [facility];  Service: General;  Laterality: Right;  . INSERTION OF DIALYSIS CATHETER Right 09/27/2019   Procedure: INSERTION OF DIALYSIS CATHETER;  Surgeon: Waynetta Sandy, MD;  Location: [place];  Service: Vascular;  Laterality: Right;  . KIDNEY SURGERY       Current Meds  Medication Sig  . acetaminophen (TYLENOL) 325 MG tablet Take 325 mg by mouth 2 (two) times daily.   [name] Kitchen albuterol (VENTOLIN HFA) 108 (90 Base) MCG/ACT inhaler Inhale 1 puff into the lungs every 6 (six) hours as needed for wheezing or shortness of breath.  [name] Kitchen apixaban (ELIQUIS) 5 MG TABS tablet Take 1 tablet (5 mg total) by mouth 2 (two) times daily.  [name] Kitchen atorvastatin (LIPITOR) 20 MG tablet Take 1  tablet by mouth at bedtime.   . calcitRIOL (ROCALTROL) 0.25 MCG capsule Take 1 capsule (0.25 mcg total) by mouth every Monday, Wednesday, and Friday.  . calcium carbonate (TUMS - DOSED IN MG ELEMENTAL CALCIUM) 500 MG chewable tablet Chew 1 tablet by mouth daily as needed for indigestion or heartburn.  . Cholecalciferol (VITAMIN D3) 50 MCG (2000 UT) TABS Take 4,000 Units by mouth every morning.   [name] Kitchen Dextromethorphan-guaiFENesin 10-100 MG/5ML liquid Take 5 mLs by mouth daily as needed (cough).  [name] Kitchen diltiazem (CARDIZEM CD) 240 MG 24 hr capsule Take 1 capsule (240 mg total) by mouth daily.  [name] Kitchen docusate sodium (COLACE) 100 MG capsule Take 1 capsule (100 mg total) by mouth 2 (two) times daily. Hold for diarrhea (Patient taking differently: Take 100 mg by mouth daily as needed. Hold for diarrhea)  . Dulaglutide (TRULICITY) 1.5 RA/0.7MA SOPN Inject 1.5 mg into the skin every Friday.   . DULoxetine (CYMBALTA) 60 MG capsule Take 1 capsule by mouth daily.  . Emollient (CETAPHIL) cream Apply 1 application topically 2 (two) times daily. Applied to BLE  For dry skin  . EPINEPHrine (EPI-PEN) 0.3  mg/0.3 mL DEVI Inject 0.3 mg into the muscle once as needed (for bee stings, then go to the ED immediately after using).   . gabapentin (NEURONTIN) 100 MG capsule Take 1 capsule (100 mg total) by mouth 3 (three) times daily. (Patient taking differently: Take 200 mg by mouth in the morning. )  . insulin lispro (HUMALOG) 100 UNIT/ML injection Inject 2-12 Units into the skin 3 (three) times daily before meals. 151-200= 2 units 201-250= 4 units 251-300= 6 units 301-350= 8 units 351-400=10units 401-450=12units >400- call MD  . levETIRAcetam (KEPPRA) 250 MG tablet Take 250 mg by mouth 2 (two) times daily.  [name] Kitchen levothyroxine (SYNTHROID) 50 MCG tablet Take 50 mcg by mouth daily before breakfast.  . linezolid (ZYVOX) 600 MG tablet Take 1 tablet (600 mg total) by mouth 2 (two) times daily. X 3 days starting 11/29/19  . metoprolol  succinate (TOPROL-XL) 200 MG 24 hr tablet Take 1 tablet (200 mg total) by mouth daily. Take with or immediately following a meal.  . montelukast (SINGULAIR) 10 MG tablet Take 10 mg by mouth at bedtime.  . Multiple Vitamin (DAILY VITE PO) Take 1 tablet by mouth every morning.   [name] Kitchen omeprazole (PRILOSEC) 20 MG capsule Take 20 mg by mouth every morning.   . primidone (MYSOLINE) 50 MG tablet Take 50 mg by mouth every morning.  [name] Kitchen PROTEIN PO Take 30 mLs by mouth in the morning and at bedtime.  . pyridOXINE (VITAMIN B-6) 100 MG tablet Take 200 mg by mouth 2 (two) times daily.  . sodium phosphate (FLEET) 7-19 GM/118ML ENEM Place 1 enema rectally daily as needed for mild constipation or severe constipation.  . traZODone (DESYREL) 50 MG tablet Take 50 mg by mouth at bedtime.   . Vitamins A & D (VITAMIN A & D) ointment Apply 1 application topically See admin instructions. To be applied after baths in the morning due to rash under skin folds  . [DISCONTINUED] predniSONE (DELTASONE) 10 MG tablet Take 10 mg by mouth at bedtime.  . [DISCONTINUED] risperiDONE (RISPERDAL) 0.5 MG tablet Take 1 tablet by mouth 2 (two) times daily.     Allergies:   Benztropine mesylate, Codeine, Diazepam, Diphenhydramine hcl, Penicillins, Sulfonamide derivatives, Thioridazine hcl, and Lisinopril   Social History   Tobacco Use  . Smoking status: Former Smoker    Types: Cigarettes    Quit date: 06/23/1972    Years since quitting: 47.5  . Smokeless tobacco: Never Used  Substance Use Topics  . Alcohol use: No  . Drug use: No     Family Hx: The patient's family history includes Bone cancer in her mother; COPD in her father; Heart disease in her father and mother; Hypertension in her mother; Stroke in her father.  ROS:   Please see the history of present illness.    All other systems reviewed and are negative.   Prior CV studies:   The following studies were reviewed today:  2D Echo 11/23/19  1. Left ventricular ejection  fraction, by estimation, is 65 to 70%. The  left ventricle has normal function. The left ventricle has no regional  wall motion abnormalities. There is mild left ventricular hypertrophy.  Left ventricular diastolic parameters  are indeterminate.  2. Right ventricular systolic function is normal. The right ventricular  size is moderately enlarged. There is moderately elevated pulmonary artery  systolic pressure.  3. Left atrial size was severely dilated.  4. Right atrial size was severely dilated.  5. The mitral valve is  normal in structure. Mild mitral valve  regurgitation. No evidence of mitral stenosis.  6. Tricuspid valve regurgitation is moderate.  7. The aortic valve is tricuspid. Aortic valve regurgitation is not  visualized. Mild to moderate aortic valve stenosis.  8. The inferior vena cava is normal in size with greater than 50%  respiratory variability, suggesting right atrial pressure of 3 mmHg.     Labs/Other Tests and Data Reviewed:    EKG:  An ECG dated 11/24/19 was personally reviewed today and demonstrated:  atrial fibrillation 117bpm with RBBB, LAFB (bifascicular block), nonspecific STT changes  Recent Labs: 11/22/2019: ALT 25; B Natriuretic Peptide 1,131.0; Magnesium 1.8; TSH 2.274 11/27/2019: BUN 56; Creatinine, Ser 4.83; Hemoglobin 9.8; Platelets 173; Potassium 4.8; Sodium 130   Recent Lipid Panel Lab Results  Component Value Date/Time   CHOL 160 09/16/2018 02:06 AM   TRIG 255 (H) 09/16/2018 02:06 AM   HDL 31 (L) 09/16/2018 02:06 AM   CHOLHDL 5.2 09/16/2018 02:06 AM   LDLCALC 78 09/16/2018 02:06 AM   LDLDIRECT 96 02/28/2008 08:44 PM    Wt Readings from Last 3 Encounters:  01/04/20 188 lb (85.3 kg)  11/27/19 194 lb 10.7 oz (88.3 kg)  11/02/19 200 lb (90.7 kg)     Objective:    Vital Signs:  BP (!) 159/94   Pulse (!) 111   Wt 188 lb (85.3 kg)   SpO2 100%   BMI 36.72 kg/m  Initial HR 118, now 111  VS reviewed. General - calm female in no acute  distress Pulm - No labored breathing, no coughing during visit, no audible wheezing, speaking in full sentences Neuro - A+Ox3, no slurred speech, answers questions appropriately but brief answers Psych - flat affect  ASSESSMENT & PLAN:    1. Persistent atrial fibrillation/flutter - difficult to ascertain in telephone virtual visit but suspect her elevated rates represent variable control. Her HR was in the 70s back in April on current regimen even when she went back into atrial fib. Therefore, the change in HR should be treated similar to a change from sinus to sinus tach in that she needs medical evaluation to ensure there is nothing else going on to make her tachycardic I.e. anemia or infection. This is not presently possible under telephone circumstances. I recommended to the facility nurse that they reach out to the nursing home physician for in-person evaluation there as she needs bloodwork and EKG to ensure no acute physiologic issues. If no specific triggers are identified I would suggest increasing diltiazem to 300mg  daily with close f/u in 1 week in the office to see her back in person with EKG. She remains on Eliquis at this time. Given h/o anemia this also needs to be followed closely as suggested above. 2. Chronic diastolic CHF - volume is now managed with dialysis. Nurse reports no swelling on examination. Blood pressure slightly elevated today but given labile nature would not aggressively acutely treat. May need to increase diltiazem as outlined above. 3. Moderate pulmonary HTN - continue to follow conservatively for now with fluid management with dialysis. Patient denies any SOB and has normal pulse ox today. 4. Mild-moderate aortic stenosis - noted on echo in April. Consider f/u echocardiogram in 1 year if clinically appropriate. 5. Chronic anemia - as above, would suggest follow-up blood count by nursing home physician given elevated heart rates.  Time:   Today, I have spent 18  minutes with the patient with telehealth technology discussing the above problems.  Medication Adjustments/Labs and Tests Ordered: Current medicines are reviewed at length with the patient today.  Testing and concerns regarding medicines are outlined above.    Follow Up:  In Person 1 week with APP  Signed, Charlie Pitter, PA-C  01/04/2020 1:57 PM    [facility]

## 2020-01-04 ENCOUNTER — Other Ambulatory Visit: Payer: Self-pay

## 2020-01-04 ENCOUNTER — Telehealth (INDEPENDENT_AMBULATORY_CARE_PROVIDER_SITE_OTHER): Payer: Medicare Other | Admitting: Physician Assistant

## 2020-01-04 ENCOUNTER — Encounter: Payer: Self-pay | Admitting: Physician Assistant

## 2020-01-04 VITALS — BP 159/94 | HR 111 | Wt 188.0 lb

## 2020-01-04 DIAGNOSIS — I272 Pulmonary hypertension, unspecified: Secondary | ICD-10-CM

## 2020-01-04 DIAGNOSIS — I350 Nonrheumatic aortic (valve) stenosis: Secondary | ICD-10-CM | POA: Diagnosis not present

## 2020-01-04 DIAGNOSIS — I4892 Unspecified atrial flutter: Secondary | ICD-10-CM | POA: Diagnosis not present

## 2020-01-04 DIAGNOSIS — D649 Anemia, unspecified: Secondary | ICD-10-CM

## 2020-01-04 DIAGNOSIS — I4819 Other persistent atrial fibrillation: Secondary | ICD-10-CM

## 2020-01-04 NOTE — Patient Instructions (Addendum)
Due to the patient's tachycardia (elevated heart rate) reported on vitals, I have recommended the nursing home staff communicate with the nursing home physician to perform an EKG with physical examination to evaluate for any potential medical causes of elevated heart. Since Ms. Pollio's heart rate was well-controlled in April and she remains on fairly high doses of heart-rate controlling medicines, she needs investigation for potential medical issues (such as infection or anemia). If no specific triggers are found and her rhythm is confirmed to still be atrial fibrillation/flutter, would suggest increasing her diltiazem to 300mg  once daily and notify for any persistent heart rates over 100.     Medication Instructions: Your physician recommends that you continue on your current medications as directed. Please refer to the Current Medication list given to you today.   Labwork: None today  Procedures/Testing: None today  Follow-Up: 7-10 days office visit with Physician Assistant and EKG    Any Additional Special Instructions Will Be Listed Below (If Applicable).     If you need a refill on your cardiac medications before your next appointment, please call your pharmacy.

## 2020-01-07 ENCOUNTER — Telehealth: Payer: Self-pay

## 2020-01-07 NOTE — Telephone Encounter (Signed)
rec'vd referral form Dr. Holley Raring requesting fistulogram d/t trouble cannulating - to scheduling

## 2020-01-16 ENCOUNTER — Other Ambulatory Visit: Payer: Self-pay

## 2020-01-20 NOTE — Progress Notes (Signed)
Cardiology Office Note   Date:  01/21/2020   ID:  [name], DOB Dec 29, 1950, MRN [id number]  PCP:  Kathleen Chroman, MD  Cardiologist:  Dr. Domenic Cross    Chief Complaint  Patient presents with  . Atrial Fibrillation      History of Present Illness: [name] is a 69 y.o. female who presents for PAF  She has a hx of chronic diastolic HF, persistent atrial fibrillation/flutter, anemia, mild-moderate aortic stenosis, ESRD on HD, HTN, HLD, bifascicular block (RBBB+LAFB), COPD, DM2, hx of PE, seizure disorder, schizophrenia, Barrett's esophagus, memory loss, OSA who presents for virtual follow-up. She was remotely seen by cardiology for diastolic CHF, complicated by CKD. Notes also outline h/o schizoaffective disorder and also possible seizures versus catatonia. She was admitted in January-February 2021 with respiratory failure/PNA and progressed to needing dialysis. During admission she was reported to have new onset atrial fibrillation (later deemed flutter per cardiology note) and was placed on Eliquis. She was readmitted 11/2019 with racing heart, found to have SVT vs atrial flutter, then interim conversion to NSR. Her carvedilol was changed to metoprolol. She also had elevated troponin in setting of severe tachycardia, ESRD, fluid overload, which was not felt to represent ACS. She required addition of diltiazem. The last note indicates she went into atrial fibrillation again and rate control strategy was employed. HR was in the 70s at time of discharge. 2D Echo 11/2019 showed EF 65-70%, mild LVH, moderate pulmonary HTN, severe biatrial enlargement, mild-moderate aortic stenosis and moderate tricuspid regurgitation. Last labs personally reviewed 11/2019 showed Hgb 9.8 (similar to previous values), sodium 130, potassium 4.8, albumin 2.8, TSH wnl, Mg 1.8.  Last  visit 01/04/20 was a virtual visit. [facility], patient's nurse, helped assist with the call today. She was with [name] at  bedside who does not offer much by way of conversation. She denies any complaints and is feeling fine. No CP, fever, chills, SOB, palpitations or any symptoms at this time. [name] reports there is no swelling. The patient gets dialysis Tuesday/Thursday/Saturday. Looking through vital signs [name] reports the patient's HR has been extremely variable. The lowest they saw was 70bpm on 5/12. It has run 88-118 today. It is currently 111 with pulse ox of 100% on RA. She is on metoprolol 200mg  daily and diltiazem 240mg  daily. Blood pressure is elevated today but is also labile, lowest recently was 108/58 and 112/60.   EKG A fib.   Dialysis is taking care of fluid mild to mod AS ,  She is dialyzed in [place] at [facility].  No chest pain, some SOB, feels her heart racing at times.  She is up in chair most of time.  Her HR was slower in hospital at times.  Here at 97 sitting up in chair.     Past Medical History:  Diagnosis Date  . Anxiety   . Asthma   . Atrial flutter ([place])   . Back pain   . Barrett esophagus   . Bifascicular block   . CHF (congestive heart failure) ([place])   . Chronic diastolic CHF (congestive heart failure) ([place])   . COPD (chronic obstructive pulmonary disease) ([place])   . Depression   . ESRD on hemodialysis ([place])   . Essential hypertension   . Fibromyalgia   . GERD (gastroesophageal reflux disease)   . Gout   . History of pericarditis   . Hyperlipidemia   . Hypothyroid   . Major depressive disorder   .  Memory loss   . Nephrolithiasis    2008  . Obesity hypoventilation syndrome (HCC)    CPAP  . Obstructive sleep apnea   . PE (pulmonary embolism)    2010  . Peripheral neuropathy   . Persistent atrial fibrillation ([place])   . PTSD (post-traumatic stress disorder)   . Pulmonary hypertension ([place])   . Rheumatoid arthritis ([place])   . Schizophrenia ([place])   . 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 Seizures ([place])   . Steroid dependence ([place])   . Type 2 diabetes  mellitus ([place])   . Vitamin D deficiency     Past Surgical History:  Procedure Laterality Date  . APPENDECTOMY    . AV FISTULA PLACEMENT Right 09/27/2019   Procedure: ARTERIOVENOUS (AV) FISTULA CREATION;  Surgeon: Waynetta Sandy, MD;  Location: [place];  Service: Vascular;  Laterality: Right;  . CHOLECYSTECTOMY    . COLONOSCOPY     benign polyps (12/2000), repeat colonoscopy performed in 2007  . ENDOMETRIAL BIOPSY     10/03 benign columnar mucosa (limited material)  . INSERTION OF DIALYSIS CATHETER Right 09/14/2019   Procedure: ATTEMPTED INSERTION OF DIALYSIS CATHETER;  Surgeon: Virl Cagey, MD;  Location: [facility];  Service: General;  Laterality: Right;  . INSERTION OF DIALYSIS CATHETER Right 09/27/2019   Procedure: INSERTION OF DIALYSIS CATHETER;  Surgeon: Waynetta Sandy, MD;  Location: [place];  Service: Vascular;  Laterality: Right;  . KIDNEY SURGERY       Current Outpatient Medications  Medication Sig Dispense Refill  . acetaminophen (TYLENOL) 325 MG tablet Take 325 mg by mouth 2 (two) times daily.     [name] Kitchen albuterol (VENTOLIN HFA) 108 (90 Base) MCG/ACT inhaler Inhale 1 puff into the lungs every 6 (six) hours as needed for wheezing or shortness of breath.    [name] Kitchen apixaban (ELIQUIS) 5 MG TABS tablet Take 1 tablet (5 mg total) by mouth 2 (two) times daily. 60 tablet   . ARIPiprazole (ABILIFY) 2 MG tablet Take 2 mg by mouth daily.    [name] Kitchen aspirin EC 81 MG tablet Take 81 mg by mouth daily.    [name] Kitchen atorvastatin (LIPITOR) 20 MG tablet Take 1 tablet by mouth at bedtime.     [name] Kitchen buPROPion (WELLBUTRIN) 75 MG tablet Take 75 mg by mouth every morning.    . calcitRIOL (ROCALTROL) 0.25 MCG capsule Take 1 capsule (0.25 mcg total) by mouth every Monday, Wednesday, and Friday.    . calcium carbonate (TUMS - DOSED IN MG ELEMENTAL CALCIUM) 500 MG chewable tablet Chew 1 tablet by mouth daily as needed for indigestion or heartburn.    . cephALEXin (KEFLEX) 500 MG capsule Take 500 mg by mouth 2  (two) times daily.    . Cholecalciferol (VITAMIN D3) 50 MCG (2000 UT) TABS Take 4,000 Units by mouth every morning.     [name] Kitchen Dextromethorphan-guaiFENesin 10-100 MG/5ML liquid Take 5 mLs by mouth daily as needed (cough).    [name] Kitchen diltiazem (CARDIZEM LA) 360 MG 24 hr tablet Take 360 mg by mouth daily.    [name] Kitchen docusate sodium (COLACE) 100 MG capsule Take 1 capsule (100 mg total) by mouth 2 (two) times daily. Hold for diarrhea    . Dulaglutide (TRULICITY) 1.5 JM/4.2AS SOPN Inject 1.5 mg into the skin every Friday.     . DULoxetine (CYMBALTA) 60 MG capsule Take 1 capsule by mouth daily.    . Emollient (CETAPHIL) cream Apply 1 application topically 2 (two) times daily. Applied to BLE  For  dry skin    . EPINEPHrine (EPI-PEN) 0.3 mg/0.3 mL DEVI Inject 0.3 mg into the muscle once as needed (for bee stings, then go to the ED immediately after using).     . furosemide (LASIX) 20 MG tablet Take 10 mg by mouth daily.    [name] Kitchen gabapentin (NEURONTIN) 100 MG capsule Take 1 capsule (100 mg total) by mouth 3 (three) times daily.    . insulin lispro (HUMALOG) 100 UNIT/ML injection Inject 2-12 Units into the skin 3 (three) times daily before meals. 151-200= 2 units 201-250= 4 units 251-300= 6 units 301-350= 8 units 351-400=10units 401-450=12units >400- call MD    . levETIRAcetam (KEPPRA) 250 MG tablet Take 250 mg by mouth 2 (two) times daily.    [name] Kitchen levothyroxine (SYNTHROID) 50 MCG tablet Take 50 mcg by mouth daily before breakfast.    . metoprolol succinate (TOPROL-XL) 200 MG 24 hr tablet Take 1 tablet (200 mg total) by mouth daily. Take with or immediately following a meal.    . montelukast (SINGULAIR) 10 MG tablet Take 10 mg by mouth at bedtime.    . Multiple Vitamin (DAILY VITE PO) Take 1 tablet by mouth every morning.     . nitroGLYCERIN (NITROSTAT) 0.4 MG SL tablet Place 0.4 mg under the tongue every 5 (five) minutes x 3 doses as needed.    [name] Kitchen omeprazole (PRILOSEC) 20 MG capsule Take 20 mg by mouth every morning.     .  predniSONE (DELTASONE) 10 MG tablet Take 10 mg by mouth daily.    . primidone (MYSOLINE) 50 MG tablet Take 50 mg by mouth every morning.    [name] Kitchen PROTEIN PO Take 30 mLs by mouth in the morning and at bedtime.    . pyridOXINE (VITAMIN B-6) 100 MG tablet Take 200 mg by mouth 2 (two) times daily.    . sodium phosphate (FLEET) 7-19 GM/118ML ENEM Place 1 enema rectally daily as needed for mild constipation or severe constipation.    . traZODone (DESYREL) 50 MG tablet Take 50 mg by mouth at bedtime.     . Vitamins A & D (VITAMIN A & D) ointment Apply 1 application topically See admin instructions. To be applied after baths in the morning due to rash under skin folds     No current facility-administered medications for this visit.    Allergies:   Benztropine mesylate, Codeine, Diazepam, Diphenhydramine hcl, Penicillins, Sulfonamide derivatives, Thioridazine hcl, and Lisinopril    Social History:  The patient  reports that she quit smoking about 47 years ago. Her smoking use included cigarettes. She has never used smokeless tobacco. She reports that she does not drink alcohol or use drugs.   Family History:  The patient's family history includes Bone cancer in her mother; COPD in her father; Heart disease in her father and mother; Hypertension in her mother; Stroke in her father.    ROS:  General:no colds or fevers, no weight changes Skin:no rashes or ulcers HEENT:no blurred vision, no congestion CV:see HPI PUL:see HPI GI:no diarrhea constipation or melena, no indigestion GU:no hematuria, no dysuria MS:no joint pain, no claudication Neuro:no syncope, no lightheadedness Endo:+ diabetes, no thyroid disease  Wt Readings from Last 3 Encounters:  01/21/20 188 lb 14.4 oz (85.7 kg)  01/04/20 188 lb (85.3 kg)  11/27/19 194 lb 10.7 oz (88.3 kg)     PHYSICAL EXAM: VS:  BP 136/76   Pulse (!) 106   Ht 5\' 2"  (1.575 m)   Wt 188 lb 14.4 oz (  85.7 kg)   SpO2 95%   BMI 34.55 kg/m  , BMI Body mass index  is 34.55 kg/m. General:Pleasant affect, NAD Skin:Warm and dry, brisk capillary refill HEENT:normocephalic, sclera clear, mucus membranes moist Neck:supple, no JVD, sitting in chair no bruits  Heart:irreg irreg without murmur, gallup, rub or click Lungs:clear to diminished  without rales, rhonchi, or wheezes LHT:DSKA, non tender, + BS, do not palpate liver spleen or masses Ext:3-4+ lower ext edema, to thighs  2+ radial pulses Neuro:alert and oriented X 3, MAE, follows commands, + facial symmetry    EKG:  EKG is ordered today. The ekg ordered today demonstrates a fib with HR 97 and RBBB and LAFB no acute changes.   Recent Labs: 11/22/2019: ALT 25; B Natriuretic Peptide 1,131.0; Magnesium 1.8; TSH 2.274 11/27/2019: BUN 56; Creatinine, Ser 4.83; Hemoglobin 9.8; Platelets 173; Potassium 4.8; Sodium 130    Lipid Panel    Component Value Date/Time   CHOL 160 09/16/2018 0206   TRIG 255 (H) 09/16/2018 0206   HDL 31 (L) 09/16/2018 0206   CHOLHDL 5.2 09/16/2018 0206   VLDL 51 (H) 09/16/2018 0206   LDLCALC 78 09/16/2018 0206   LDLDIRECT 96 02/28/2008 2044       Other studies Reviewed: Additional studies/ records that were reviewed today include: . Echo 11/23/19 IMPRESSIONS    1. Left ventricular ejection fraction, by estimation, is 65 to 70%. The  left ventricle has normal function. The left ventricle has no regional  wall motion abnormalities. There is mild left ventricular hypertrophy.  Left ventricular diastolic parameters  are indeterminate.  2. Right ventricular systolic function is normal. The right ventricular  size is moderately enlarged. There is moderately elevated pulmonary artery  systolic pressure.  3. Left atrial size was severely dilated.  4. Right atrial size was severely dilated.  5. The mitral valve is normal in structure. Mild mitral valve  regurgitation. No evidence of mitral stenosis.  6. Tricuspid valve regurgitation is moderate.  7. The aortic  valve is tricuspid. Aortic valve regurgitation is not  visualized. Mild to moderate aortic valve stenosis.  8. The inferior vena cava is normal in size with greater than 50%  respiratory variability, suggesting right atrial pressure of 3 mmHg.   FINDINGS  Left Ventricle: Left ventricular ejection fraction, by estimation, is 65  to 70%. The left ventricle has normal function. The left ventricle has no  regional wall motion abnormalities. The left ventricular internal cavity  size was normal in size. There is  mild left ventricular hypertrophy. Left ventricular diastolic parameters  are indeterminate.   Right Ventricle: The right ventricular size is moderately enlarged. No  increase in right ventricular wall thickness. Right ventricular systolic  function is normal. There is moderately elevated pulmonary artery systolic  pressure. The tricuspid regurgitant  velocity is 3.63 m/s, and with an assumed right atrial pressure of 10  mmHg, the estimated right ventricular systolic pressure is 76.8 mmHg.   Left Atrium: Left atrial size was severely dilated.   Right Atrium: Right atrial size was severely dilated.   Pericardium: There is no evidence of pericardial effusion.   Mitral Valve: The mitral valve is normal in structure. Mild mitral valve  regurgitation. No evidence of mitral valve stenosis.   Tricuspid Valve: The tricuspid valve is normal in structure. Tricuspid  valve regurgitation is moderate . No evidence of tricuspid stenosis.   Aortic Valve: The aortic valve is tricuspid. . There is mild thickening  and mild calcification of the  aortic valve. Aortic valve regurgitation is  not visualized. Mild to moderate aortic stenosis is present. Mild aortic  valve annular calcification. There  is mild thickening of the aortic valve. There is mild calcification of the  aortic valve. Aortic valve mean gradient measures 15.3 mmHg. Aortic valve  peak gradient measures 26.7 mmHg. Aortic  valve area, by VTI measures 1.31  cm.   Pulmonic Valve: The pulmonic valve was not well visualized. Pulmonic valve  regurgitation is not visualized. No evidence of pulmonic stenosis.   Aorta: The aortic root is normal in size and structure.  ASSESSMENT AND PLAN:  1.  Persistent Atrial fib/ RBBB with RVR at times on toprol XL 200 mg daly and dilt 360 mg daily.  Unable to increase  Also on anticoagulation with eliquis.  Plan is for rate control only.  Pt has elevated HR may be due to volume overload.  She is for dialysis tomorrow.  They may need to consider if possible removing more fluid.  Follow up in 1 month  Reviewed nursing home papers and hospital notes.   2.  ESRD on HD T/TH/SAT with anemia, followed by renal. Will send them note to evaluate. Pt is on lasix 10 mg but she does not void much not sure increasing will help again will defer to renal.  3.  Lower ext edema, CCB may be contributing but will add TED hose and ask SNF to elevate her legs frequentily.    4.  Chronic diastolic HF see above.    5.  Moderate PH continue with dialysis.   6.  Mild to moderate AS. Follow as outpt may need follow up echo in a year    Current medicines are reviewed with the patient today.  The patient Has no concerns regarding medicines.  The following changes have been made:  See above Labs/ tests ordered today include:see above  Disposition:   FU:  see above  Signed, Cecilie Kicks, NP  01/21/2020 4:28 PM    [facility] [place], [place], [place], [place] Phone: [phone]; Fax: [phone]

## 2020-01-21 ENCOUNTER — Ambulatory Visit (INDEPENDENT_AMBULATORY_CARE_PROVIDER_SITE_OTHER): Payer: Medicare Other | Admitting: Cardiology

## 2020-01-21 ENCOUNTER — Encounter: Payer: Self-pay | Admitting: Cardiology

## 2020-01-21 VITALS — BP 136/76 | HR 106 | Ht 62.0 in | Wt 188.9 lb

## 2020-01-21 DIAGNOSIS — I5032 Chronic diastolic (congestive) heart failure: Secondary | ICD-10-CM | POA: Diagnosis not present

## 2020-01-21 DIAGNOSIS — I4510 Unspecified right bundle-branch block: Secondary | ICD-10-CM | POA: Diagnosis not present

## 2020-01-21 DIAGNOSIS — N186 End stage renal disease: Secondary | ICD-10-CM

## 2020-01-21 DIAGNOSIS — I350 Nonrheumatic aortic (valve) stenosis: Secondary | ICD-10-CM | POA: Diagnosis not present

## 2020-01-21 DIAGNOSIS — I272 Pulmonary hypertension, unspecified: Secondary | ICD-10-CM

## 2020-01-21 DIAGNOSIS — Z992 Dependence on renal dialysis: Secondary | ICD-10-CM

## 2020-01-21 DIAGNOSIS — I4819 Other persistent atrial fibrillation: Secondary | ICD-10-CM | POA: Diagnosis not present

## 2020-01-21 DIAGNOSIS — R600 Localized edema: Secondary | ICD-10-CM

## 2020-01-21 NOTE — Patient Instructions (Addendum)
Medication Instructions:  Your physician recommends that you continue on your current medications as directed. Please refer to the Current Medication list given to you today.  *If you need a refill on your cardiac medications before your next appointment, please call your pharmacy*   Lab Work: None If you have labs (blood work) drawn today and your tests are completely normal, you will receive your results only by: [name] Kitchen MyChart Message (if you have MyChart) OR . A paper copy in the mail If you have any lab test that is abnormal or we need to change your treatment, we will call you to review the results.   Testing/Procedures: None   Follow-Up: At [facility], you and your health needs are our priority.  As part of our continuing mission to provide you with exceptional heart care, we have created designated Provider Care Teams.  These Care Teams include your primary Cardiologist (physician) and Advanced Practice Providers (APPs -  Physician Assistants and Nurse Practitioners) who all work together to provide you with the care you need, when you need it.  We recommend signing up for the patient portal called "MyChart".  Sign up information is provided on this After Visit Summary.  MyChart is used to connect with patients for Virtual Visits (Telemedicine).  Patients are able to view lab/test results, encounter notes, upcoming appointments, etc.  Non-urgent messages can be sent to your provider as well.   To learn more about what you can do with MyChart, go to [email or web address].    Your next appointment:   1 month(s)  The format for your next appointment:   In Person  Provider:   You may see Rozann Lesches, MD or one of the following Advanced Practice Providers on your designated Care Team:    Bernerd Pho, PA-C   Ermalinda Barrios, PA-C     Other Instructions 1) Knee high support stockings 2) Keep legs elevated while in wheel-chair

## 2020-01-23 ENCOUNTER — Inpatient Hospital Stay (HOSPITAL_COMMUNITY): Payer: Medicare Other

## 2020-01-23 ENCOUNTER — Encounter (HOSPITAL_COMMUNITY): Payer: Self-pay | Admitting: Emergency Medicine

## 2020-01-23 ENCOUNTER — Other Ambulatory Visit: Payer: Self-pay

## 2020-01-23 ENCOUNTER — Inpatient Hospital Stay (HOSPITAL_COMMUNITY)
Admit: 2020-01-23 | Discharge: 2020-01-25 | DRG: 689 | Disposition: A | Discharge status: Skilled Nursing Facility | Payer: Medicare Other | Source: Skilled Nursing Facility | Attending: Family Medicine | Admitting: Family Medicine

## 2020-01-23 ENCOUNTER — Emergency Department (HOSPITAL_COMMUNITY): Payer: Medicare Other

## 2020-01-23 DIAGNOSIS — E039 Hypothyroidism, unspecified: Secondary | ICD-10-CM | POA: Diagnosis present

## 2020-01-23 DIAGNOSIS — I4819 Other persistent atrial fibrillation: Secondary | ICD-10-CM

## 2020-01-23 DIAGNOSIS — M109 Gout, unspecified: Secondary | ICD-10-CM | POA: Diagnosis present

## 2020-01-23 DIAGNOSIS — E1165 Type 2 diabetes mellitus with hyperglycemia: Secondary | ICD-10-CM

## 2020-01-23 DIAGNOSIS — Y95 Nosocomial condition: Secondary | ICD-10-CM | POA: Diagnosis present

## 2020-01-23 DIAGNOSIS — R569 Unspecified convulsions: Secondary | ICD-10-CM

## 2020-01-23 DIAGNOSIS — Z79899 Other long term (current) drug therapy: Secondary | ICD-10-CM

## 2020-01-23 DIAGNOSIS — J441 Chronic obstructive pulmonary disease with (acute) exacerbation: Secondary | ICD-10-CM | POA: Diagnosis present

## 2020-01-23 DIAGNOSIS — E1122 Type 2 diabetes mellitus with diabetic chronic kidney disease: Secondary | ICD-10-CM | POA: Diagnosis present

## 2020-01-23 DIAGNOSIS — Z794 Long term (current) use of insulin: Secondary | ICD-10-CM

## 2020-01-23 DIAGNOSIS — J302 Other seasonal allergic rhinitis: Secondary | ICD-10-CM | POA: Diagnosis present

## 2020-01-23 DIAGNOSIS — G20 Parkinson's disease: Secondary | ICD-10-CM | POA: Diagnosis present

## 2020-01-23 DIAGNOSIS — F209 Schizophrenia, unspecified: Secondary | ICD-10-CM | POA: Diagnosis present

## 2020-01-23 DIAGNOSIS — N12 Tubulo-interstitial nephritis, not specified as acute or chronic: Secondary | ICD-10-CM

## 2020-01-23 DIAGNOSIS — M797 Fibromyalgia: Secondary | ICD-10-CM | POA: Diagnosis present

## 2020-01-23 DIAGNOSIS — F32A Depression, unspecified: Secondary | ICD-10-CM | POA: Diagnosis present

## 2020-01-23 DIAGNOSIS — I5033 Acute on chronic diastolic (congestive) heart failure: Secondary | ICD-10-CM | POA: Diagnosis present

## 2020-01-23 DIAGNOSIS — Z992 Dependence on renal dialysis: Secondary | ICD-10-CM

## 2020-01-23 DIAGNOSIS — I5032 Chronic diastolic (congestive) heart failure: Secondary | ICD-10-CM | POA: Diagnosis present

## 2020-01-23 DIAGNOSIS — Z87891 Personal history of nicotine dependence: Secondary | ICD-10-CM

## 2020-01-23 DIAGNOSIS — F79 Unspecified intellectual disabilities: Secondary | ICD-10-CM | POA: Diagnosis present

## 2020-01-23 DIAGNOSIS — Z880 Allergy status to penicillin: Secondary | ICD-10-CM

## 2020-01-23 DIAGNOSIS — Z825 Family history of asthma and other chronic lower respiratory diseases: Secondary | ICD-10-CM

## 2020-01-23 DIAGNOSIS — Z7901 Long term (current) use of anticoagulants: Secondary | ICD-10-CM

## 2020-01-23 DIAGNOSIS — Z20822 Contact with and (suspected) exposure to covid-19: Secondary | ICD-10-CM | POA: Diagnosis present

## 2020-01-23 DIAGNOSIS — A419 Sepsis, unspecified organism: Secondary | ICD-10-CM

## 2020-01-23 DIAGNOSIS — E1142 Type 2 diabetes mellitus with diabetic polyneuropathy: Secondary | ICD-10-CM | POA: Diagnosis present

## 2020-01-23 DIAGNOSIS — I10 Essential (primary) hypertension: Secondary | ICD-10-CM | POA: Diagnosis present

## 2020-01-23 DIAGNOSIS — F329 Major depressive disorder, single episode, unspecified: Secondary | ICD-10-CM | POA: Diagnosis present

## 2020-01-23 DIAGNOSIS — J9811 Atelectasis: Secondary | ICD-10-CM | POA: Diagnosis present

## 2020-01-23 DIAGNOSIS — F419 Anxiety disorder, unspecified: Secondary | ICD-10-CM | POA: Diagnosis present

## 2020-01-23 DIAGNOSIS — Z7982 Long term (current) use of aspirin: Secondary | ICD-10-CM

## 2020-01-23 DIAGNOSIS — F4310 Post-traumatic stress disorder, unspecified: Secondary | ICD-10-CM | POA: Diagnosis present

## 2020-01-23 DIAGNOSIS — I132 Hypertensive heart and chronic kidney disease with heart failure and with stage 5 chronic kidney disease, or end stage renal disease: Secondary | ICD-10-CM | POA: Diagnosis present

## 2020-01-23 DIAGNOSIS — N136 Pyonephrosis: Secondary | ICD-10-CM | POA: Diagnosis present

## 2020-01-23 DIAGNOSIS — E119 Type 2 diabetes mellitus without complications: Secondary | ICD-10-CM

## 2020-01-23 DIAGNOSIS — Z885 Allergy status to narcotic agent status: Secondary | ICD-10-CM

## 2020-01-23 DIAGNOSIS — J9621 Acute and chronic respiratory failure with hypoxia: Secondary | ICD-10-CM | POA: Diagnosis not present

## 2020-01-23 DIAGNOSIS — Z87440 Personal history of urinary (tract) infections: Secondary | ICD-10-CM

## 2020-01-23 DIAGNOSIS — E785 Hyperlipidemia, unspecified: Secondary | ICD-10-CM | POA: Diagnosis present

## 2020-01-23 DIAGNOSIS — R0902 Hypoxemia: Secondary | ICD-10-CM

## 2020-01-23 DIAGNOSIS — J9601 Acute respiratory failure with hypoxia: Secondary | ICD-10-CM | POA: Diagnosis present

## 2020-01-23 DIAGNOSIS — K219 Gastro-esophageal reflux disease without esophagitis: Secondary | ICD-10-CM | POA: Diagnosis present

## 2020-01-23 DIAGNOSIS — Z792 Long term (current) use of antibiotics: Secondary | ICD-10-CM

## 2020-01-23 DIAGNOSIS — Z7952 Long term (current) use of systemic steroids: Secondary | ICD-10-CM

## 2020-01-23 DIAGNOSIS — E8889 Other specified metabolic disorders: Secondary | ICD-10-CM | POA: Diagnosis present

## 2020-01-23 DIAGNOSIS — Z9049 Acquired absence of other specified parts of digestive tract: Secondary | ICD-10-CM

## 2020-01-23 DIAGNOSIS — G40909 Epilepsy, unspecified, not intractable, without status epilepticus: Secondary | ICD-10-CM | POA: Diagnosis present

## 2020-01-23 DIAGNOSIS — M069 Rheumatoid arthritis, unspecified: Secondary | ICD-10-CM | POA: Diagnosis present

## 2020-01-23 DIAGNOSIS — N186 End stage renal disease: Secondary | ICD-10-CM | POA: Diagnosis present

## 2020-01-23 DIAGNOSIS — G4733 Obstructive sleep apnea (adult) (pediatric): Secondary | ICD-10-CM | POA: Diagnosis present

## 2020-01-23 DIAGNOSIS — I272 Pulmonary hypertension, unspecified: Secondary | ICD-10-CM | POA: Diagnosis present

## 2020-01-23 DIAGNOSIS — G219 Secondary parkinsonism, unspecified: Secondary | ICD-10-CM | POA: Diagnosis present

## 2020-01-23 DIAGNOSIS — Z888 Allergy status to other drugs, medicaments and biological substances status: Secondary | ICD-10-CM

## 2020-01-23 DIAGNOSIS — N390 Urinary tract infection, site not specified: Secondary | ICD-10-CM | POA: Diagnosis present

## 2020-01-23 DIAGNOSIS — Z86711 Personal history of pulmonary embolism: Secondary | ICD-10-CM | POA: Diagnosis not present

## 2020-01-23 DIAGNOSIS — Z8249 Family history of ischemic heart disease and other diseases of the circulatory system: Secondary | ICD-10-CM

## 2020-01-23 DIAGNOSIS — Z882 Allergy status to sulfonamides status: Secondary | ICD-10-CM

## 2020-01-23 DIAGNOSIS — J9691 Respiratory failure, unspecified with hypoxia: Secondary | ICD-10-CM | POA: Diagnosis present

## 2020-01-23 DIAGNOSIS — Z79891 Long term (current) use of opiate analgesic: Secondary | ICD-10-CM

## 2020-01-23 HISTORY — DX: Rheumatoid vasculitis with rheumatoid arthritis of unspecified site: M05.20

## 2020-01-23 HISTORY — DX: Epilepsy, unspecified, not intractable, without status epilepticus: G40.909

## 2020-01-23 HISTORY — DX: Angina pectoris, unspecified: I20.9

## 2020-01-23 HISTORY — DX: Constipation, unspecified: K59.00

## 2020-01-23 HISTORY — DX: Tachycardia, unspecified: R00.0

## 2020-01-23 HISTORY — DX: Dysuria: R30.0

## 2020-01-23 HISTORY — DX: Long term (current) use of insulin: Z79.4

## 2020-01-23 HISTORY — DX: Body mass index (BMI) 39.0-39.9, adult: Z68.39

## 2020-01-23 HISTORY — DX: Dysphagia, unspecified: R13.10

## 2020-01-23 HISTORY — DX: Pure hypercholesterolemia, unspecified: E78.00

## 2020-01-23 HISTORY — DX: Pain in left arm: M79.602

## 2020-01-23 HISTORY — DX: Unspecified psychosis not due to a substance or known physiological condition: F29

## 2020-01-23 HISTORY — DX: Insomnia, unspecified: G47.00

## 2020-01-23 HISTORY — DX: Urinary tract infection, site not specified: N39.0

## 2020-01-23 HISTORY — DX: Contact with and (suspected) exposure to covid-19: Z20.822

## 2020-01-23 HISTORY — DX: Moderate protein-calorie malnutrition: E44.0

## 2020-01-23 HISTORY — DX: Other polyp of sinus: J33.8

## 2020-01-23 HISTORY — DX: Hypothyroidism, unspecified: E03.9

## 2020-01-23 HISTORY — DX: Unspecified intellectual disabilities: F79

## 2020-01-23 HISTORY — DX: Cellulitis, unspecified: L03.90

## 2020-01-23 HISTORY — DX: Morbid (severe) obesity due to excess calories: E66.01

## 2020-01-23 HISTORY — DX: Dependence on renal dialysis: Z99.2

## 2020-01-23 LAB — BLOOD GAS, ARTERIAL
Acid-Base Excess: 3.9 mmol/L — ABNORMAL HIGH (ref 0.0–2.0)
Bicarbonate: 27.1 mmol/L (ref 20.0–28.0)
Drawn by: 35043
FIO2: 60
O2 Saturation: 96.3 %
Patient temperature: 37
pCO2 arterial: 52.9 mmHg — ABNORMAL HIGH (ref 32.0–48.0)
pH, Arterial: 7.357 (ref 7.350–7.450)
pO2, Arterial: 92.6 mmHg (ref 83.0–108.0)

## 2020-01-23 LAB — CBC WITH DIFFERENTIAL/PLATELET
Abs Immature Granulocytes: 0.19 10*3/uL — ABNORMAL HIGH (ref 0.00–0.07)
Basophils Absolute: 0.1 10*3/uL (ref 0.0–0.1)
Basophils Relative: 1 %
Eosinophils Absolute: 0.1 10*3/uL (ref 0.0–0.5)
Eosinophils Relative: 1 %
HCT: 39.2 % (ref 36.0–46.0)
Hemoglobin: 12.4 g/dL (ref 12.0–15.0)
Immature Granulocytes: 2 %
Lymphocytes Relative: 14 %
Lymphs Abs: 1.5 10*3/uL (ref 0.7–4.0)
MCH: 34 pg (ref 26.0–34.0)
MCHC: 31.6 g/dL (ref 30.0–36.0)
MCV: 107.4 fL — ABNORMAL HIGH (ref 80.0–100.0)
Monocytes Absolute: 1 10*3/uL (ref 0.1–1.0)
Monocytes Relative: 9 %
Neutro Abs: 7.8 10*3/uL — ABNORMAL HIGH (ref 1.7–7.7)
Neutrophils Relative %: 73 %
Platelets: 242 10*3/uL (ref 150–400)
RBC: 3.65 MIL/uL — ABNORMAL LOW (ref 3.87–5.11)
RDW: 15.9 % — ABNORMAL HIGH (ref 11.5–15.5)
WBC: 10.7 10*3/uL — ABNORMAL HIGH (ref 4.0–10.5)
nRBC: 0 % (ref 0.0–0.2)

## 2020-01-23 LAB — URINALYSIS, ROUTINE W REFLEX MICROSCOPIC
Bilirubin Urine: NEGATIVE
Glucose, UA: >=500 mg/dL — AB
Ketones, ur: NEGATIVE mg/dL
Nitrite: NEGATIVE
Protein, ur: >=300 mg/dL — AB
RBC / HPF: >50 RBC/hpf — ABNORMAL HIGH (ref 0–5)
Specific Gravity, Urine: 1.016 (ref 1.005–1.030)
WBC, UA: >50 WBC/hpf — ABNORMAL HIGH (ref 0–5)
pH: 5 (ref 5.0–8.0)

## 2020-01-23 LAB — PROTIME-INR
INR: 1.5 — ABNORMAL HIGH (ref 0.8–1.2)
Prothrombin Time: 17.3 seconds — ABNORMAL HIGH (ref 11.4–15.2)

## 2020-01-23 LAB — COMPREHENSIVE METABOLIC PANEL
ALT: 169 U/L — ABNORMAL HIGH (ref 0–44)
AST: 213 U/L — ABNORMAL HIGH (ref 15–41)
Albumin: 3 g/dL — ABNORMAL LOW (ref 3.5–5.0)
Alkaline Phosphatase: 167 U/L — ABNORMAL HIGH (ref 38–126)
Anion gap: 12 (ref 5–15)
BUN: 46 mg/dL — ABNORMAL HIGH (ref 8–23)
CO2: 29 mmol/L (ref 22–32)
Calcium: 10.7 mg/dL — ABNORMAL HIGH (ref 8.9–10.3)
Chloride: 97 mmol/L — ABNORMAL LOW (ref 98–111)
Creatinine, Ser: 3.52 mg/dL — ABNORMAL HIGH (ref 0.44–1.00)
GFR calc Af Amer: 15 mL/min — ABNORMAL LOW (ref >60)
GFR calc non Af Amer: 13 mL/min — ABNORMAL LOW (ref >60)
Glucose, Bld: 249 mg/dL — ABNORMAL HIGH (ref 70–99)
Potassium: 3.6 mmol/L (ref 3.5–5.1)
Sodium: 138 mmol/L (ref 135–145)
Total Bilirubin: 0.5 mg/dL (ref 0.3–1.2)
Total Protein: 6.2 g/dL — ABNORMAL LOW (ref 6.5–8.1)

## 2020-01-23 LAB — LACTIC ACID, PLASMA: Lactic Acid, Venous: 1.9 mmol/L (ref 0.5–1.9)

## 2020-01-23 LAB — SARS CORONAVIRUS 2 BY RT PCR (HOSPITAL ORDER, PERFORMED IN ~~LOC~~ HOSPITAL LAB): SARS Coronavirus 2: NEGATIVE

## 2020-01-23 LAB — APTT: aPTT: 33 seconds (ref 24–36)

## 2020-01-23 MED ORDER — LEVETIRACETAM 250 MG PO TABS
250.0000 mg | ORAL_TABLET | Freq: Two times a day (BID) | ORAL | Status: DC
  Administered 2020-01-24 – 2020-01-25 (×3): 250 mg via ORAL
  Filled 2020-01-23 (×3): qty 1

## 2020-01-23 MED ORDER — PANTOPRAZOLE SODIUM 40 MG PO TBEC
40.0000 mg | DELAYED_RELEASE_TABLET | Freq: Every day | ORAL | Status: DC
  Administered 2020-01-24 – 2020-01-25 (×2): 40 mg via ORAL
  Filled 2020-01-23 (×2): qty 1

## 2020-01-23 MED ORDER — GABAPENTIN 100 MG PO CAPS
100.0000 mg | ORAL_CAPSULE | Freq: Three times a day (TID) | ORAL | Status: DC
  Administered 2020-01-24 – 2020-01-25 (×4): 100 mg via ORAL
  Filled 2020-01-23 (×5): qty 1

## 2020-01-23 MED ORDER — ARIPIPRAZOLE 2 MG PO TABS
2.0000 mg | ORAL_TABLET | Freq: Every day | ORAL | Status: DC
  Administered 2020-01-23 – 2020-01-25 (×3): 2 mg via ORAL
  Filled 2020-01-23 (×7): qty 1

## 2020-01-23 MED ORDER — SODIUM CHLORIDE 0.9 % IV SOLN
1.0000 g | INTRAVENOUS | Status: DC
  Administered 2020-01-23 – 2020-01-25 (×2): 1 g via INTRAVENOUS
  Filled 2020-01-23 (×7): qty 1

## 2020-01-23 MED ORDER — ALBUTEROL SULFATE (2.5 MG/3ML) 0.083% IN NEBU: 2.5000 mg | INHALATION_SOLUTION | RESPIRATORY_TRACT | Status: DC | PRN

## 2020-01-23 MED ORDER — IPRATROPIUM-ALBUTEROL 0.5-2.5 (3) MG/3ML IN SOLN
3.0000 mL | Freq: Four times a day (QID) | RESPIRATORY_TRACT | Status: DC
  Administered 2020-01-23 – 2020-01-24 (×2): 3 mL via RESPIRATORY_TRACT
  Filled 2020-01-23 (×2): qty 3

## 2020-01-23 MED ORDER — ACETAMINOPHEN 650 MG RE SUPP: 650.0000 mg | Freq: Four times a day (QID) | RECTAL | Status: DC | PRN

## 2020-01-23 MED ORDER — ATORVASTATIN CALCIUM 20 MG PO TABS
20.0000 mg | ORAL_TABLET | Freq: Every day | ORAL | Status: DC
  Administered 2020-01-24: 20 mg via ORAL
  Filled 2020-01-23: qty 1

## 2020-01-23 MED ORDER — TRAZODONE HCL 50 MG PO TABS
50.0000 mg | ORAL_TABLET | Freq: Every day | ORAL | Status: DC
  Administered 2020-01-24: 50 mg via ORAL
  Filled 2020-01-23: qty 1

## 2020-01-23 MED ORDER — LEVOTHYROXINE SODIUM 50 MCG PO TABS
50.0000 ug | ORAL_TABLET | Freq: Every day | ORAL | Status: DC
  Administered 2020-01-25: 50 ug via ORAL
  Filled 2020-01-23 (×2): qty 1

## 2020-01-23 MED ORDER — VANCOMYCIN VARIABLE DOSE PER UNSTABLE RENAL FUNCTION (PHARMACIST DOSING): Status: DC

## 2020-01-23 MED ORDER — SODIUM CHLORIDE 0.9 % IV SOLN
500.0000 mg | INTRAVENOUS | Status: DC
  Administered 2020-01-23: 500 mg via INTRAVENOUS
  Filled 2020-01-23: qty 500

## 2020-01-23 MED ORDER — VANCOMYCIN HCL 1750 MG/350ML IV SOLN
1750.0000 mg | Freq: Once | INTRAVENOUS | Status: AC
  Administered 2020-01-23: 1750 mg via INTRAVENOUS
  Filled 2020-01-23: qty 350

## 2020-01-23 MED ORDER — ASPIRIN EC 81 MG PO TBEC
81.0000 mg | DELAYED_RELEASE_TABLET | Freq: Every day | ORAL | Status: DC
  Administered 2020-01-23 – 2020-01-25 (×3): 81 mg via ORAL
  Filled 2020-01-23 (×3): qty 1

## 2020-01-23 MED ORDER — METOPROLOL SUCCINATE ER 50 MG PO TB24
200.0000 mg | ORAL_TABLET | Freq: Every day | ORAL | Status: DC
  Administered 2020-01-24 – 2020-01-25 (×2): 200 mg via ORAL
  Filled 2020-01-23: qty 8
  Filled 2020-01-23: qty 4

## 2020-01-23 MED ORDER — PRIMIDONE 50 MG PO TABS
50.0000 mg | ORAL_TABLET | Freq: Every day | ORAL | Status: DC
  Administered 2020-01-24 – 2020-01-25 (×2): 50 mg via ORAL
  Filled 2020-01-23 (×3): qty 1

## 2020-01-23 MED ORDER — ACETAMINOPHEN 325 MG PO TABS
650.0000 mg | ORAL_TABLET | Freq: Four times a day (QID) | ORAL | Status: DC | PRN
  Administered 2020-01-25: 650 mg via ORAL
  Filled 2020-01-23: qty 2

## 2020-01-23 MED ORDER — BUPROPION HCL 75 MG PO TABS
75.0000 mg | ORAL_TABLET | Freq: Every day | ORAL | Status: DC
  Administered 2020-01-24 – 2020-01-25 (×2): 75 mg via ORAL
  Filled 2020-01-23 (×5): qty 1

## 2020-01-23 MED ORDER — METHYLPREDNISOLONE SODIUM SUCC 125 MG IJ SOLR
125.0000 mg | Freq: Once | INTRAMUSCULAR | Status: AC
  Administered 2020-01-23: 125 mg via INTRAVENOUS
  Filled 2020-01-23: qty 2

## 2020-01-23 MED ORDER — APIXABAN 5 MG PO TABS
5.0000 mg | ORAL_TABLET | Freq: Two times a day (BID) | ORAL | Status: DC
  Administered 2020-01-24 – 2020-01-25 (×3): 5 mg via ORAL
  Filled 2020-01-23 (×3): qty 1

## 2020-01-23 MED ORDER — DILTIAZEM HCL ER COATED BEADS 180 MG PO CP24
360.0000 mg | ORAL_CAPSULE | Freq: Every day | ORAL | Status: DC
  Administered 2020-01-24 – 2020-01-25 (×2): 360 mg via ORAL
  Filled 2020-01-23 (×3): qty 2

## 2020-01-23 MED ORDER — DULOXETINE HCL 60 MG PO CPEP
60.0000 mg | ORAL_CAPSULE | Freq: Every day | ORAL | Status: DC
  Administered 2020-01-24 – 2020-01-25 (×2): 60 mg via ORAL
  Filled 2020-01-23: qty 1
  Filled 2020-01-23: qty 2

## 2020-01-23 MED ORDER — MONTELUKAST SODIUM 10 MG PO TABS
10.0000 mg | ORAL_TABLET | Freq: Every day | ORAL | Status: DC
  Administered 2020-01-24: 10 mg via ORAL
  Filled 2020-01-23: qty 1

## 2020-01-23 MED ORDER — METHYLPREDNISOLONE SODIUM SUCC 40 MG IJ SOLR
40.0000 mg | Freq: Four times a day (QID) | INTRAMUSCULAR | Status: DC
  Administered 2020-01-24 – 2020-01-25 (×7): 40 mg via INTRAVENOUS
  Filled 2020-01-23 (×7): qty 1

## 2020-01-23 NOTE — H&P (Signed)
History and Physical:    [name]   RKY:[id number] DOB: [birth year] DOA: 01/23/2020  Referring MD/provider: Dr. Sabra Heck PCP: Glenda Chroman, MD   Patient coming from: SNF [facility] nursing facility  Chief Complaint: "I could not catch my breath today".  Patient is only able to give a limited history secondary to shortness of breath  History of Present Illness:   [name] is an 69 y.o. female nursing home resident with PMH significant for acute respiratory failure requiring intubation secondary to COPD and pneumonia, ESRD on HD, diastolic heart failure, PAF, moderate AAS, history of PE, DM 2, OHS, HTN, rheumatoid arthritis, unspecified psychiatric disorder, and seizure disorder who was sent to ED due to shortness of breath.   Patient tells me that she has been feeling unwell for a while but is unable to specify how long.  Patient tells me today she was unable to catch her breath at all which is why she came to the ED.  Patient states she has had sweats recently.  No change in baseline cough.  Patient is unable to provide further history.  She denies chest pain.  Denies nausea vomiting or abdominal pain.  Patient admits she is tired of breathing.  ED Course:  The patient was found to have a UTI and to be hypoxic.  She was placed on 100% nonrebreather.  Chest x-ray was found to have atelectasis.  She was treated with azithromycin.   ROS:   ROS   Review of Systems: As per HPI but patient is unable to provide due to shortness of breath.  Past Medical History:   Past Medical History:  Diagnosis Date  . Anemia   . Angina pectoris ([place])   . Anxiety   . Asthma   . Atrial flutter ([place])   . Back pain   . Barrett esophagus   . Bifascicular block   . BMI 39.0-39.9,adult   . Cellulitis   . CHF (congestive heart failure) ([place])   . Chronic diastolic CHF (congestive heart failure) ([place])   . Constipation   . Contact with and (suspected) exposure to covid-19   . COPD  (chronic obstructive pulmonary disease) ([place])   . Dependence on renal dialysis ([place])   . Depression   . Dysphagia   . Dysuria   . Epilepsy ([place])   . ESRD on hemodialysis ([place])   . Essential hypertension   . Fibromyalgia   . Fibromyalgia   . GERD (gastroesophageal reflux disease)   . Gout   . History of pericarditis   . Hypercholesterolemia   . Hyperlipidemia   . Hypothyroid   . Hypothyroidism   . Insomnia   . Long term (current) use of insulin ([place])   . Major depressive disorder   . Memory loss   . Moderate protein-calorie malnutrition ([place])   . Morbid obesity ([place])   . Nephrolithiasis    2008  . Obesity hypoventilation syndrome (HCC)    CPAP  . Obstructive sleep apnea   . Pain in left arm   . PE (pulmonary embolism)    2010  . Peripheral neuropathy   . Persistent atrial fibrillation ([place])   . Polyp of nasal sinus   . PTSD (post-traumatic stress disorder)   . Pulmonary hypertension ([place])   . Rheumatoid arteritis ([place])   . Rheumatoid arthritis ([place])   . Schizophrenia ([place])   . 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 Seizures ([place])   . Steroid dependence ([place])   .  Tachycardia, unspecified   . Type 2 diabetes mellitus ([place])   . Unspecified intellectual disabilities   . Unspecified psychosis not due to a substance or known physiological condition ([place])   . Urinary tract infection   . Vitamin D deficiency     Past Surgical History:   Past Surgical History:  Procedure Laterality Date  . APPENDECTOMY    . AV FISTULA PLACEMENT Right 09/27/2019   Procedure: ARTERIOVENOUS (AV) FISTULA CREATION;  Surgeon: Waynetta Sandy, MD;  Location: [facility];  Service: Vascular;  Laterality: Right;  . CHOLECYSTECTOMY    . COLONOSCOPY     benign polyps (12/2000), repeat colonoscopy performed in 2007  . ENDOMETRIAL BIOPSY     10/03 benign columnar mucosa (limited material)  . INSERTION OF DIALYSIS CATHETER Right 09/14/2019   Procedure: ATTEMPTED INSERTION OF DIALYSIS  CATHETER;  Surgeon: Virl Cagey, MD;  Location: [facility];  Service: General;  Laterality: Right;  . INSERTION OF DIALYSIS CATHETER Right 09/27/2019   Procedure: INSERTION OF DIALYSIS CATHETER;  Surgeon: Waynetta Sandy, MD;  Location: [facility];  Service: Vascular;  Laterality: Right;  . KIDNEY SURGERY      Social History:   Social History   Socioeconomic History  . Marital status: Single    Spouse name: Not on file  . Number of children: 0  . Years of education: Not on file  . Highest education level: 12th grade  Occupational History  . Not on file  Tobacco Use  . Smoking status: Former Smoker    Types: Cigarettes    Quit date: 06/23/1972    Years since quitting: 47.6  . Smokeless tobacco: Never Used  Substance and Sexual Activity  . Alcohol use: No  . Drug use: No  . Sexual activity: Never  Other Topics Concern  . Not on file  Social History Narrative   Right handed   Caffeine 1 cup per day    Lives at [facility] Strain:   . Difficulty of Paying Living Expenses:   Food Insecurity:   . Worried About Charity fundraiser in the Last Year:   . Arboriculturist in the Last Year:   Transportation Needs:   . Film/video editor (Medical):   [name] Kitchen Lack of Transportation (Non-Medical):   Physical Activity:   . Days of Exercise per Week:   . Minutes of Exercise per Session:   Stress:   . Feeling of Stress :   Social Connections:   . Frequency of Communication with Friends and Family:   . Frequency of Social Gatherings with Friends and Family:   . Attends Religious Services:   . Active Member of Clubs or Organizations:   . Attends Archivist Meetings:   [name] Kitchen Marital Status:   Intimate Partner Violence:   . Fear of Current or Ex-Partner:   . Emotionally Abused:   [name] Kitchen Physically Abused:   . Sexually Abused:     Allergies   Benztropine mesylate, Codeine,  Diazepam, Diphenhydramine hcl, Penicillins, Sulfonamide derivatives, Thioridazine hcl, and Lisinopril  Family history:   Family History  Problem Relation Age of Onset  . Bone cancer Mother   . Hypertension Mother   . Heart disease Mother   . COPD Father   . Heart disease Father   . Stroke Father     Current Medications:   Prior to Admission medications  Medication Sig Start Date End Date Taking? Authorizing Provider  acetaminophen (TYLENOL) 325 MG tablet Take 325 mg by mouth 2 (two) times daily.    Yes [provider]  albuterol (VENTOLIN HFA) 108 (90 Base) MCG/ACT inhaler Inhale 1 puff into the lungs every 6 (six) hours as needed for wheezing or shortness of breath.   Yes [provider]  Amino Acids (AMINO ACID PROTEIN PO) Take 30 mLs by mouth in the morning and at bedtime.   Yes [provider]  apixaban (ELIQUIS) 5 MG TABS tablet Take 1 tablet (5 mg total) by mouth 2 (two) times daily. 10/01/19  Yes Shelly Coss, MD  ARIPiprazole (ABILIFY) 2 MG tablet Take 2 mg by mouth daily. 12/10/19  Yes [provider]  aspirin EC 81 MG tablet Take 81 mg by mouth daily.   Yes [provider]  atorvastatin (LIPITOR) 20 MG tablet Take 20 mg by mouth at bedtime.  05/14/19  Yes [provider]  buPROPion (WELLBUTRIN) 75 MG tablet Take 75 mg by mouth daily.  01/02/20  Yes [provider]  calcitRIOL (ROCALTROL) 0.25 MCG capsule Take 1 capsule (0.25 mcg total) by mouth every Monday, Wednesday, and Friday. 10/01/19  Yes Shelly Coss, MD  Calcium Acetate 667 MG TABS Take 2,001 mg by mouth 3 (three) times daily.   Yes [provider]  calcium carbonate (TUMS - DOSED IN MG ELEMENTAL CALCIUM) 500 MG chewable tablet Chew 500 mg by mouth daily as needed for indigestion or heartburn.    Yes [provider]  cephALEXin (KEFLEX) 500 MG capsule Take 500 mg by mouth every 12 (twelve) hours.    Yes [provider]    Cholecalciferol (VITAMIN D3) 50 MCG (2000 UT) TABS Take 4,000 Units by mouth daily.    Yes [provider]  Dextromethorphan-guaiFENesin 10-100 MG/5ML liquid Take 5 mLs by mouth daily as needed (cough).   Yes [provider]  diltiazem (CARDIZEM LA) 360 MG 24 hr tablet Take 360 mg by mouth daily.   Yes [provider]  Dulaglutide (TRULICITY) 1.5 ZO/1.0RU SOPN Inject 1.5 mg into the skin every Friday.    Yes [provider]  DULoxetine (CYMBALTA) 60 MG capsule Take 60 mg by mouth daily.  05/11/19  Yes [provider]  Emollient (CETAPHIL) cream Apply 1 application topically daily as needed (Apply to legs and feet for hydration).    Yes [provider]  EPINEPHrine (EPI-PEN) 0.3 mg/0.3 mL DEVI Inject 0.3 mg into the muscle once as needed (for bee stings, then go to the ED immediately after using).    Yes [provider]  furosemide (LASIX) 20 MG tablet Take 10 mg by mouth daily. 12/20/19  Yes [provider]  gabapentin (NEURONTIN) 100 MG capsule Take 1 capsule (100 mg total) by mouth 3 (three) times daily. Patient taking differently: Take 100 mg by mouth every 8 (eight) hours.  10/01/19  Yes Adhikari, Tamsen Meek, MD  insulin lispro (HUMALOG) 100 UNIT/ML injection Inject 10 Units into the skin 3 (three) times daily before meals. Additional units according to sliding scale 151-200= 2 units 201-250= 4 units 251-300= 6 units 301-350= 8 units 351-400=10units 401-450=12units >400- call MD   Yes [provider]  levETIRAcetam (KEPPRA) 250 MG tablet Take 250 mg by mouth 2 (two) times daily.   Yes [provider]  levothyroxine (SYNTHROID) 50 MCG tablet Take 50 mcg by mouth daily before breakfast.   Yes [provider]  metoprolol succinate (  TOPROL-XL) 200 MG 24 hr tablet Take 1 tablet (200 mg total) by mouth daily. Take with or immediately following a meal. 11/27/19  Yes Tat, Shanon Brow, MD  montelukast (SINGULAIR) 10  MG tablet Take 10 mg by mouth at bedtime.   Yes [provider]  Multiple Vitamin (DAILY VITE PO) Take 1 tablet by mouth every morning.    Yes [provider]  nitroGLYCERIN (NITROSTAT) 0.4 MG SL tablet Place 0.4 mg under the tongue every 5 (five) minutes x 3 doses as needed. 01/13/20  Yes [provider]  omeprazole (PRILOSEC) 20 MG capsule Take 20 mg by mouth every morning.    Yes [provider]  predniSONE (DELTASONE) 10 MG tablet Take 10 mg by mouth daily.   Yes [provider]  primidone (MYSOLINE) 50 MG tablet Take 50 mg by mouth daily.    Yes [provider]  pyridOXINE (VITAMIN B-6) 100 MG tablet Take 200 mg by mouth 2 (two) times daily.   Yes [provider]  senna (SENOKOT) 8.6 MG tablet Take 1 tablet by mouth 2 (two) times daily.   Yes [provider]  traZODone (DESYREL) 50 MG tablet Take 50 mg by mouth at bedtime.  05/11/19  Yes [provider]    Physical Exam:   Vitals:   01/23/20 1700 01/23/20 1730 01/23/20 1800 01/23/20 1830  BP: (!) 142/82 (!) 152/79 (!) 149/87 (!) 149/71  Pulse: 70 79 74 64  Resp: (!) 29 (!) 27 (!) 31 (!) 31  Temp:      TempSrc:      SpO2: 100% 100% 100% 100%  Weight:      Height:         Physical Exam: Blood pressure (!) 149/71, pulse 64, temperature 97.9 F (36.6 C), temperature source Axillary, resp. rate (!) 31, height 5\' 2"  (1.575 m), weight 85 kg, SpO2 100 %. Gen: Very tired appearing female lying in stretcher with tachypnea and accessory muscle use. Eyes: sclera anicteric, conjuctiva mildly injected bilaterally CVS: Distant heart sounds S1-S2, regulary, 2/6 systolic murmur Respiratory:  decreased air entry bilaterally with rales at bases.  No wheezes audible. GI: NABS, soft, NT  LE: 1+ chronic brawny edema Neuro: A/O x 3, grossly nonfocal.    Data Review:    Labs: Basic Metabolic Panel: Recent Labs  Lab 01/23/20 1445  NA 138  K 3.6  CL 97*  CO2 29   GLUCOSE 249*  BUN 46*  CREATININE 3.52*  CALCIUM 10.7*   Liver Function Tests: Recent Labs  Lab 01/23/20 1445  AST 213*  ALT 169*  ALKPHOS 167*  BILITOT 0.5  PROT 6.2*  ALBUMIN 3.0*   No results for input(s): LIPASE, AMYLASE in the last 168 hours. No results for input(s): AMMONIA in the last 168 hours. CBC: Recent Labs  Lab 01/23/20 1445  WBC 10.7*  NEUTROABS 7.8*  HGB 12.4  HCT 39.2  MCV 107.4*  PLT 242   Cardiac Enzymes: No results for input(s): CKTOTAL, CKMB, CKMBINDEX, TROPONINI in the last 168 hours.  BNP (last 3 results) No results for input(s): PROBNP in the last 8760 hours. CBG: No results for input(s): GLUCAP in the last 168 hours.  Urinalysis    Component Value Date/Time   COLORURINE YELLOW 01/23/2020 1452   APPEARANCEUR TURBID (A) 01/23/2020 1452   LABSPEC 1.016 01/23/2020 1452   PHURINE 5.0 01/23/2020 1452   GLUCOSEU >=500 (A) 01/23/2020 1452   HGBUR LARGE (A) 01/23/2020 1452   HGBUR negative 10/20/2009  [place] 01/23/2020 1452   IXL 01/23/2020 1452   PROTEINUR >=300 (A) 01/23/2020 1452   UROBILINOGEN 0.2 10/05/2011 1002   NITRITE NEGATIVE 01/23/2020 1452   LEUKOCYTESUR MODERATE (A) 01/23/2020 1452      Radiographic Studies: DG Chest Port 1 View  Result Date: 01/23/2020 CLINICAL DATA:  Hypoxia, shortness of breath and cough today, diabetes mellitus, end-stage renal disease, CHF, asthma, former smoker, history pulmonary embolism EXAM: PORTABLE CHEST 1 VIEW COMPARISON:  Portable exam 1453 hours compared to 11/22/2019 FINDINGS: LEFT jugular central venous catheter with tip projecting over SVC near cavoatrial junction. Enlargement of cardiac silhouette with pulmonary vascular congestion. Mediastinal contours normal. Minimal atelectasis RIGHT base. No definite infiltrate, pleural effusion or pneumothorax. Bones demineralized. IMPRESSION: Enlargement of cardiac silhouette with minimal RIGHT basilar atelectasis. No  acute infiltrate. Electronically Signed   By: Lavonia Dana M.D.   On: 01/23/2020 15:18    EKG: Independently reviewed.  Atrial fibrillation at 90 with RBBB and left anterior fascicular block without change from previous.   Assessment/Plan:   Principal Problem:   Acute on chronic respiratory failure with hypoxia (HCC) Active Problems:   Depression   Essential hypertension   Chronic diastolic heart failure (HCC)   History of pulmonary embolus (PE)   Type 2 diabetes mellitus (HCC)   Hypothyroidism   Seizure (HCC)   ESRD (end stage renal disease) (HCC)   Persistent atrial fibrillation ([place])  69 year old female with multiple medical problems most particularly previous history of intubation for acute on chronic respiratory failure now presents with shortness of breath.  To my examination she appears very tired although is oxygenating well on 100% nonrebreather.   Acute on chronic respiratory failure Patient has had hypercapnic and hypoxic respiratory failure in the past requiring intubation. Patient seems to be getting tired Stat ABG ordered, patient would benefit from BiPAP or may be intubation depending on results.  Cause of acute on chronic respiratory failure is multifactorial and includes struct of lung disease, obesity hypoventilation, probably some infiltrative lung disease with likely HCAP, likely pulmonary hypertension secondary to OSA.  Some atelectasis on chest x-ray does not explain the degree of hypoxia although she may have baseline hypoxia secondary to obesity hypoventilation and may also be having a COPD flare. Noncontrast chest CT ordered. PE unlikely as patient is on Eliquis.  HCAP Patient is noted to have a penicillin allergy with "swelling" however patient apparently has been tolerating Keflex at nursing home per pharmacy without difficulty. We will start patient on vancomycin and cefepime per pharmacy consultation which is appreciated.  COPD We will start patient  on steroids and duo nebs every 6 BG ordered as noted above.  UTI patient has history of VRE Patient has a dirty UA, has previous history of UTI with VRE. At this point I am not sure whether she has colonization or a real UTI. Patient is on cefepime, await urine cultures and blood cultures. Can broaden antibiotics as warranted, she was previously treated with daptomycin in April 2021 and discharged home on linezolid.  ESRD On HD, no evidence of fluid overload at present Nephrology consult placed, will likely need to be called in the morning  Atrial fibrillation Rate controlled, continue diltiazem and Toprol-XL Continue Eliquis for secondary prevention of stroke  HTN Continue diltiazem and metoprolol  Hold Lasix  DM2 Patient is n.p.o., CBG every 6 with SSI coverage low dose given ESRD Hold Trulicity  Seizure disorder Continue Keppra, gabapentin and primidone  Diastolic CHF  Patient does not seem to be in decompensated heart failure at present Fluid management per hemodialysis.  Hypothyroidism Continue Synthroid  Psychiatric disorder: Depression versus schizophrenia both of which are on problem list Continue Abilify, bupropion, duloxetine and trazodone per home doses  Rheumatoid arthritis versus fibromyalgia, both of which are on the chart Patient is on chronic prednisone, place patient on high-dose steroids    Other information:   DVT prophylaxis: Eliquis ordered. Code Status: Full Family Communication: None Disposition Plan: SNF Consults called: Renal for the morning Admission status: Inpatient  [place] [facility]  If 7PM-7AM, please contact night-coverage [email or web address] Password [facility] 01/23/2020, 7:36 PM

## 2020-01-23 NOTE — ED Provider Notes (Signed)
[facility] EMERGENCY DEPARTMENT Provider Note   CSN: [id number] Arrival date & time: 01/23/20  1428     History Chief Complaint  Patient presents with  . Shortness of Breath    [name] is a 69 y.o. female.  HPI   This patient is a 69 year old female, she is critically ill and appears too ill to speak, she comes from [facility] facility where she is currently living.  She is a dialysis patient, has COPD as well as congestive heart failure, known atrial flutter and essential hypertension.  She has obstructive sleep apnea, schizophrenia, she has Parkinson's disease as well as type 2 diabetes.  The patient has evidently had progressively worsening symptoms recently with reports of urinary tract infection by the nursing home, the paramedics report that yesterday a chest x-ray was done showing bilateral infiltrates with effusions and antibiotics were started.  Today she became more short of breath and was in respiratory distress with hypoxia.  Paramedics found the patient to be diaphoretic ill-appearing and barely breathing, nonrebreather was placed, blood sugar was noted to be just over 260 and the patient perked up just a little bit with oxygen.  On her arrival the patient is unable to give me any information, level 5 caveat applies.  Past Medical History:  Diagnosis Date  . Anemia   . Angina pectoris ([place])   . Anxiety   . Asthma   . Atrial flutter ([place])   . Back pain   . Barrett esophagus   . Bifascicular block   . BMI 39.0-39.9,adult   . Cellulitis   . CHF (congestive heart failure) ([place])   . Chronic diastolic CHF (congestive heart failure) ([place])   . Constipation   . Contact with and (suspected) exposure to covid-19   . COPD (chronic obstructive pulmonary disease) ([place])   . Dependence on renal dialysis ([place])   . Depression   . Dysphagia   . Dysuria   . Epilepsy ([place])   . ESRD on hemodialysis ([place])   . Essential hypertension   . Fibromyalgia   .  Fibromyalgia   . GERD (gastroesophageal reflux disease)   . Gout   . History of pericarditis   . Hypercholesterolemia   . Hyperlipidemia   . Hypothyroid   . Hypothyroidism   . Insomnia   . Long term (current) use of insulin ([place])   . Major depressive disorder   . Memory loss   . Moderate protein-calorie malnutrition ([place])   . Morbid obesity ([place])   . Nephrolithiasis    2008  . Obesity hypoventilation syndrome (HCC)    CPAP  . Obstructive sleep apnea   . Pain in left arm   . PE (pulmonary embolism)    2010  . Peripheral neuropathy   . Persistent atrial fibrillation ([place])   . Polyp of nasal sinus   . PTSD (post-traumatic stress disorder)   . Pulmonary hypertension ([place])   . Rheumatoid arteritis ([place])   . Rheumatoid arthritis ([place])   . Schizophrenia ([place])   . Secondary Parkinson disease ([place]) 10/19/2018  . Seizures ([place])   . 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 Tachycardia, unspecified   . Type 2 diabetes mellitus ([place])   . Unspecified intellectual disabilities   . Unspecified psychosis not due to a substance or known physiological condition ([place])   . Urinary tract infection   . Vitamin D deficiency     Patient Active Problem List   Diagnosis Date Noted  . Cellulitis and  abscess of right leg 11/23/2019  . ESRD (end stage renal disease) ([place]) 11/23/2019  . Elevated troponin   . Atrial fibrillation with rapid ventricular response ([place]) 11/22/2019  . Acute pulmonary edema (HCC)   . Acute respiratory failure with hypoxia and hypercapnia ([place]) 09/10/2019  . SVT (supraventricular tachycardia) ([place]) 09/10/2019  . On mechanically assisted ventilation ([place]) 09/08/2019  . Acute metabolic encephalopathy 18/84/1660  . Volume overload 09/06/2019  . AKI (acute kidney injury) ([place]) 09/06/2019  . Altered mental state 11/28/2018  . Near syncope 11/28/2018  . Fall at home 11/28/2018  . History of seizure 11/28/2018  . Secondary Parkinson disease ([place]) 10/19/2018  . Pressure injury of skin  09/17/2018  . Respiratory failure ([place]) 08/16/2018  . Seizure ([place]) 08/16/2018  . Acute encephalopathy 07/27/2018  . Endotracheally intubated 07/27/2018  . Hypothyroidism 07/27/2018  . Pleuritic chest pain 07/14/2018  . Moderate persistent asthma without complication   . Type 2 diabetes mellitus ([place]) 04/19/2018  . Acute kidney injury superimposed on chronic kidney disease ([place]) 01/13/2018  . Altered mental status   . Encephalopathy 10/11/2016  . Chest pain 08/12/2016  . CKD (chronic kidney disease), stage IV ([place]) 08/12/2016  . History of pulmonary embolus (PE) 08/12/2016  . Obesity, Class III, BMI 40-49.9 (morbid obesity) ([place]) 08/12/2016  . RBBB 08/12/2016  . Positive D dimer 08/12/2016  . Cerebral infarction ([place]) 11/10/2015  . Physical deconditioning 07/01/2011  . Weakness 07/01/2011  . Diabetic foot ulcer ([place]) 05/19/2011  . Polypharmacy 02/09/2011  . BACK PAIN WITH RADICULOPATHY 05/25/2010  . Type 2 diabetes mellitus with diabetic polyneuropathy, without long-term current use of insulin ([place]) 08/18/2009  . Chronic diastolic heart failure ([place]) 04/25/2009  . Normocytic anemia 04/14/2009  . Sleep apnea 12/11/2008  . Diabetic peripheral neuropathy ([place]) 05/04/2007  . HYPERCHOLESTEROLEMIA 10/13/2006  . Gout, unspecified 10/13/2006  . HYPOKALEMIA 10/13/2006  . Schizophrenia ([place]) 10/13/2006  . Depression 10/13/2006  . Essential hypertension 10/13/2006  . Venous (peripheral) insufficiency 10/13/2006  . RHINITIS, ALLERGIC 10/13/2006  . Asthma 10/13/2006  . Reflux esophagitis 10/13/2006  . OSTEOARTHRITIS, MULTI SITES 10/13/2006  . INCONTINENCE, URGE 10/13/2006    Past Surgical History:  Procedure Laterality Date  . APPENDECTOMY    . AV FISTULA PLACEMENT Right 09/27/2019   Procedure: ARTERIOVENOUS (AV) FISTULA CREATION;  Surgeon: Waynetta Sandy, MD;  Location: [facility];  Service: Vascular;  Laterality: Right;  . CHOLECYSTECTOMY    . COLONOSCOPY     benign polyps  (12/2000), repeat colonoscopy performed in 2007  . ENDOMETRIAL BIOPSY     10/03 benign columnar mucosa (limited material)  . INSERTION OF DIALYSIS CATHETER Right 09/14/2019   Procedure: ATTEMPTED INSERTION OF DIALYSIS CATHETER;  Surgeon: Virl Cagey, MD;  Location: [facility];  Service: General;  Laterality: Right;  . INSERTION OF DIALYSIS CATHETER Right 09/27/2019   Procedure: INSERTION OF DIALYSIS CATHETER;  Surgeon: Waynetta Sandy, MD;  Location: [facility];  Service: Vascular;  Laterality: Right;  . KIDNEY SURGERY       OB History   No obstetric history on file.     Family History  Problem Relation Age of Onset  . Bone cancer Mother   . Hypertension Mother   . Heart disease Mother   . COPD Father   . Heart disease Father   . Stroke Father     Social History   Tobacco Use  . Smoking status: Former Smoker    Types: Cigarettes    Quit date: 06/23/1972  Years since quitting: 47.6  . Smokeless tobacco: Never Used  Substance Use Topics  . Alcohol use: No  . Drug use: No    Home Medications Prior to Admission medications   Medication Sig Start Date End Date Taking? Authorizing Provider  acetaminophen (TYLENOL) 325 MG tablet Take 325 mg by mouth 2 (two) times daily.     [provider]  albuterol (VENTOLIN HFA) 108 (90 Base) MCG/ACT inhaler Inhale 1 puff into the lungs every 6 (six) hours as needed for wheezing or shortness of breath.    [provider]  Amino Acids (AMINO ACID PROTEIN PO) Take 30 mLs by mouth in the morning and at bedtime.    [provider]  apixaban (ELIQUIS) 5 MG TABS tablet Take 1 tablet (5 mg total) by mouth 2 (two) times daily. 10/01/19   Shelly Coss, MD  ARIPiprazole (ABILIFY) 2 MG tablet Take 2 mg by mouth daily. 12/10/19   [provider]  aspirin EC 81 MG tablet Take 81 mg by mouth daily.    [provider]  atorvastatin (LIPITOR) 20 MG tablet Take 20 mg by mouth at bedtime.  05/14/19    [provider]  buPROPion (WELLBUTRIN) 75 MG tablet Take 75 mg by mouth daily.  01/02/20   [provider]  calcitRIOL (ROCALTROL) 0.25 MCG capsule Take 1 capsule (0.25 mcg total) by mouth every Monday, Wednesday, and Friday. 10/01/19   Shelly Coss, MD  Calcium Acetate 667 MG TABS Take 2,001 mg by mouth 3 (three) times daily.    [provider]  calcium carbonate (TUMS - DOSED IN MG ELEMENTAL CALCIUM) 500 MG chewable tablet Chew 500 mg by mouth daily as needed for indigestion or heartburn.     [provider]  cephALEXin (KEFLEX) 500 MG capsule Take 500 mg by mouth every 12 (twelve) hours.     [provider]  Cholecalciferol (VITAMIN D3) 50 MCG (2000 UT) TABS Take 4,000 Units by mouth daily.     [provider]  Dextromethorphan-guaiFENesin 10-100 MG/5ML liquid Take 5 mLs by mouth daily as needed (cough).    [provider]  diltiazem (CARDIZEM LA) 360 MG 24 hr tablet Take 360 mg by mouth daily.    [provider]  docusate sodium (COLACE) 100 MG capsule Take 1 capsule (100 mg total) by mouth 2 (two) times daily. Hold for diarrhea Patient not taking: Reported on 01/22/2020 11/29/18   Barton Dubois, MD  Dulaglutide (TRULICITY) 1.5 KX/3.8HW SOPN Inject 1.5 mg into the skin every Friday.     [provider]  DULoxetine (CYMBALTA) 60 MG capsule Take 60 mg by mouth daily.  05/11/19   [provider]  Emollient (CETAPHIL) cream Apply 1 application topically daily as needed (Apply to legs and feet for hydration).     [provider]  EPINEPHrine (EPI-PEN) 0.3 mg/0.3 mL DEVI Inject 0.3 mg into the muscle once as needed (for bee stings, then go to the ED immediately after using).     [provider]  furosemide (LASIX) 20 MG tablet Take 10 mg by mouth daily. 12/20/19   [provider]  gabapentin (NEURONTIN) 100 MG capsule Take 1 capsule (100 mg total) by mouth 3 (three) times daily. 10/01/19    Shelly Coss, MD  insulin lispro (HUMALOG) 100 UNIT/ML injection Inject 10 Units into the skin 3 (three) times daily before meals. Additional units according to sliding scale 151-200= 2 units 201-250= 4 units 251-300= 6 units 301-350= 8  units 351-400=10units 401-450=12units >400- call MD    [provider]  levETIRAcetam (KEPPRA) 250 MG tablet Take 250 mg by mouth 2 (two) times daily.    [provider]  levothyroxine (SYNTHROID) 50 MCG tablet Take 50 mcg by mouth daily before breakfast.    [provider]  metoprolol succinate (TOPROL-XL) 200 MG 24 hr tablet Take 1 tablet (200 mg total) by mouth daily. Take with or immediately following a meal. 11/27/19   Tat, Shanon Brow, MD  montelukast (SINGULAIR) 10 MG tablet Take 10 mg by mouth at bedtime.    [provider]  Multiple Vitamin (DAILY VITE PO) Take 1 tablet by mouth every morning.     [provider]  nitroGLYCERIN (NITROSTAT) 0.4 MG SL tablet Place 0.4 mg under the tongue every 5 (five) minutes x 3 doses as needed. 01/13/20   [provider]  omeprazole (PRILOSEC) 20 MG capsule Take 20 mg by mouth every morning.     [provider]  predniSONE (DELTASONE) 10 MG tablet Take 10 mg by mouth daily.    [provider]  primidone (MYSOLINE) 50 MG tablet Take 50 mg by mouth daily.     [provider]  pyridOXINE (VITAMIN B-6) 100 MG tablet Take 200 mg by mouth 2 (two) times daily.    [provider]  senna (SENOKOT) 8.6 MG tablet Take 1 tablet by mouth 2 (two) times daily.    [provider]  traZODone (DESYREL) 50 MG tablet Take 50 mg by mouth at bedtime.  05/11/19   [provider]    Allergies    Benztropine mesylate, Codeine, Diazepam, Diphenhydramine hcl, Penicillins, Sulfonamide derivatives, Thioridazine hcl, and Lisinopril  Review of Systems   Review of Systems  Physical Exam Updated Vital Signs BP (!) 150/89   Pulse 89   Temp  100.1 F (37.8 C) (Rectal)   Resp (!) 32   Ht 1.575 m (5\' 2" )   Wt 85 kg   SpO2 100%   BMI 34.27 kg/m   Physical Exam Vitals and nursing note reviewed.  Constitutional:      General: She is in acute distress.     Appearance: She is well-developed. She is ill-appearing, toxic-appearing and diaphoretic.  HENT:     Head: Normocephalic and atraumatic.     Mouth/Throat:     Pharynx: No oropharyngeal exudate.  Eyes:     General: No scleral icterus.       Right eye: No discharge.        Left eye: No discharge.     Conjunctiva/sclera: Conjunctivae normal.     Pupils: Pupils are equal, round, and reactive to light.  Neck:     Thyroid: No thyromegaly.     Vascular: No JVD.  Cardiovascular:     Rate and Rhythm: Regular rhythm. Tachycardia present.     Heart sounds: Normal heart sounds. No murmur. No friction rub. No gallop.   Pulmonary:     Effort: Tachypnea, accessory muscle usage and respiratory distress present.     Breath sounds: No wheezing or rales.  Abdominal:     General: Bowel sounds are normal. There is no distension.     Palpations: Abdomen is soft. There is no mass.     Tenderness: There is no abdominal tenderness.  Musculoskeletal:        General: Swelling present. No tenderness, deformity or signs of injury. Normal range of motion.     Cervical back: Normal range of motion and  neck supple.     Right lower leg: Edema present.     Left lower leg: Edema present.  Lymphadenopathy:     Cervical: No cervical adenopathy.  Skin:    General: Skin is warm.     Findings: No erythema or rash.  Neurological:     Mental Status: She is alert.     Comments: Obtunded, responsive to verbal stimuli  Psychiatric:        Behavior: Behavior normal.     ED Results / Procedures / Treatments   Labs (all labs ordered are listed, but only abnormal results are displayed) Labs Reviewed  COMPREHENSIVE METABOLIC PANEL - Abnormal; Notable for the following components:      Result Value    Chloride 97 (*)    Glucose, Bld 249 (*)    BUN 46 (*)    Creatinine, Ser 3.52 (*)    Calcium 10.7 (*)    Total Protein 6.2 (*)    Albumin 3.0 (*)    AST 213 (*)    ALT 169 (*)    Alkaline Phosphatase 167 (*)    GFR calc non Af Amer 13 (*)    GFR calc Af Amer 15 (*)    All other components within normal limits  CBC WITH DIFFERENTIAL/PLATELET - Abnormal; Notable for the following components:   WBC 10.7 (*)    RBC 3.65 (*)    MCV 107.4 (*)    RDW 15.9 (*)    Neutro Abs 7.8 (*)    Abs Immature Granulocytes 0.19 (*)    All other components within normal limits  PROTIME-INR - Abnormal; Notable for the following components:   Prothrombin Time 17.3 (*)    INR 1.5 (*)    All other components within normal limits  URINALYSIS, ROUTINE W REFLEX MICROSCOPIC - Abnormal; Notable for the following components:   APPearance TURBID (*)    Glucose, UA >=500 (*)    Hgb urine dipstick LARGE (*)    Protein, ur >=300 (*)    Leukocytes,Ua MODERATE (*)    RBC / HPF >50 (*)    WBC, UA >50 (*)    Bacteria, UA MANY (*)    Non Squamous Epithelial 0-5 (*)    All other components within normal limits  CULTURE, BLOOD (ROUTINE X 2)  CULTURE, BLOOD (ROUTINE X 2)  URINE CULTURE  SARS CORONAVIRUS 2 BY RT PCR (HOSPITAL ORDER, [facility])  LACTIC ACID, PLASMA  APTT  LACTIC ACID, PLASMA    EKG EKG Interpretation  Date/Time:  Wednesday January 23 2020 14:45:15 EDT Ventricular Rate:  95 PR Interval:    QRS Duration: 188 QT Interval:  379 QTC Calculation: 477 R Axis:   -62 Text Interpretation: Atrial fibrillation RBBB and LAFB Probable left ventricular hypertrophy since last tracing no significant change Confirmed by Noemi Chapel [phone]) on 01/23/2020 3:13:01 PM   Radiology DG Chest Port 1 View  Result Date: 01/23/2020 CLINICAL DATA:  Hypoxia, shortness of breath and cough today, diabetes mellitus, end-stage renal disease, CHF, asthma, former smoker, history pulmonary  embolism EXAM: PORTABLE CHEST 1 VIEW COMPARISON:  Portable exam 1453 hours compared to 11/22/2019 FINDINGS: LEFT jugular central venous catheter with tip projecting over SVC near cavoatrial junction. Enlargement of cardiac silhouette with pulmonary vascular congestion. Mediastinal contours normal. Minimal atelectasis RIGHT base. No definite infiltrate, pleural effusion or pneumothorax. Bones demineralized. IMPRESSION: Enlargement of cardiac silhouette with minimal RIGHT basilar atelectasis. No acute infiltrate. Electronically Signed   By:  Lavonia Dana M.D.   On: 01/23/2020 15:18    Procedures .Critical Care Performed by: Noemi Chapel, MD Authorized by: Noemi Chapel, MD   Critical care provider statement:    Critical care time (minutes):  35   Critical care time was exclusive of:  Separately billable procedures and treating other patients and teaching time   Critical care was necessary to treat or prevent imminent or life-threatening deterioration of the following conditions:  Sepsis and respiratory failure   Critical care was time spent personally by me on the following activities:  Blood draw for specimens, development of treatment plan with patient or surrogate, discussions with consultants, evaluation of patient's response to treatment, examination of patient, obtaining history from patient or surrogate, ordering and performing treatments and interventions, ordering and review of laboratory studies, ordering and review of radiographic studies, pulse oximetry, re-evaluation of patient's condition and review of old charts   (including critical care time)  Medications Ordered in ED Medications  azithromycin (ZITHROMAX) 500 mg in sodium chloride 0.9 % 250 mL IVPB (500 mg Intravenous New Bag/Given 01/23/20 1517)    ED Course  I have reviewed the triage vital signs and the nursing notes.  Pertinent labs & imaging results that were available during my care of the patient were reviewed by me and  considered in my medical decision making (see chart for details).    MDM Rules/Calculators/A&P                      This patient is ill-appearing, somnolent to obtunded and having difficulty breathing though oxygenating in the low 90s on room air.  She will be placed on a nonrebreather, antibiotics have been given prehospital in the form of Rocephin, I will add Zithromax, saline lock, IV fluids as needed.  The patient is not hypotensive at this time.  She has a good thrill in the right upper extremity where there is a fistula that does not look like it is yet matured.  Her dialysis catheter looks okay.  Abdomen is soft with a reducible mid abdominal hernia.  At this time I think the patient will need to be admitted to the hospital, to a high level of care, she may need to be intubated if she does not improve however she is able to wake up and follow simple commands and then fall back somnolent.  Critically ill  Lactic acid is 1.9, metabolic panel shows chronic renal failure, no change from patient's baseline.  Urinalysis with many bacteria, greater than 50 white blood cells, likely source  We will discuss with hospitalist for admission, antibiotics have already been given including Rocephin which will cover the urinary source.  The patient does not need 30 cc/kg based on her lactic acid, her blood pressure both of which are reassuring however she has sepsis from likely pyelonephritis  Final Clinical Impression(s) / ED Diagnoses Final diagnoses:  Sepsis, due to unspecified organism, unspecified whether acute organ dysfunction present [facility])  Pyelonephritis      Noemi Chapel, MD 01/23/20 1539

## 2020-01-23 NOTE — Progress Notes (Signed)
Pharmacy Antibiotic Note  [name] is a 69 y.o. female admitted on 01/23/2020 with pneumonia and sepsis.  Pharmacy has been consulted for cefepime and vancomycin dosing. Patient with h/o ESRD on HD. Patient has PCN allergy of swelling, however has been taking cephalexin for several days prior to admission with no issues per SNF personnel.    Plan: - Cefepime 1gm IV q24h - Vancomycin 1750 mg IV x 1,  follow up dosing after each dialysis once dialysis schedule established inpatient - Vancomycin random levels as needed  Height: 5\' 2"  (157.5 cm) Weight: 85 kg (187 lb 6.3 oz) IBW/kg (Calculated) : 50.1  Temp (24hrs), Avg:99 F (37.2 C), Min:97.9 F (36.6 C), Max:100.1 F (37.8 C)  Recent Labs  Lab 01/23/20 1445  WBC 10.7*  CREATININE 3.52*  LATICACIDVEN 1.9    Estimated Creatinine Clearance: 15.5 mL/min (A) (by C-G formula based on SCr of 3.52 mg/dL (H)).    Allergies  Allergen Reactions  . Benztropine Mesylate Other (See Comments)    Alopecia and white spots on eyes  . Codeine Swelling, Palpitations and Other (See Comments)    Mouth Swelling and Tachycardia   . Diazepam Other (See Comments)    COMA  . Diphenhydramine Hcl Anxiety, Palpitations and Other (See Comments)    Tachycardia and anxiousness  . Penicillins Swelling  . Sulfonamide Derivatives Rash    Blisters, also  . Thioridazine Hcl Swelling  . Lisinopril Cough    Antimicrobials this admission: 6/9 vancomycin >>  6/9 cefepime >>   Dose adjustments this admission: n/a  Microbiology results: 6/9 BCx: NGTD 6/9 UCx: sent   [name] A. Levada Dy, PharmD, BCPS, FNKF Clinical Pharmacist [place] Please utilize Amion for appropriate phone number to reach the unit pharmacist ([place])     01/23/2020 7:22 PM

## 2020-01-23 NOTE — ED Notes (Signed)
Attempted to switch NRB 10 L to 5L [place] per EDP request. Pt desaturated to 89 % . Placed back on NRBat 10L

## 2020-01-23 NOTE — ED Triage Notes (Addendum)
Per EMS, called out in reference to hypoxia. Pt 86% on room air. Pt placed on 15 liters with EMS 100% at time of arrival. Per EMS, facility reports pt is currently being treated for uti. pt didn't feel well yesterday, chest xray showed bilateral basilar atelectasis, and right effusion.   EMS administered 1gm rocephin. cbg 268.  NRB removed, room air saturation 95%. Pt continues work to breathe. EDP reports to place pt on low flow (10L) non-rebreather.

## 2020-01-23 NOTE — ED Provider Notes (Signed)
Patient CARE taken over at handoff.  Concern for pyelonephritis, sepsis, possible pneumonia in a dialysis patient. Patient hypoxia on arrival plan for reassessment and to wean oxygen as tolerated.  IV antibiotics given, lactate returned normal. Discussed with hospitalist for admission.  Potassium low normal reviewed. Reassessment patient mild tachypnea, oxygen 90%, attempted to wean to nasal cannula however patient dropped to the eighties. Awaiting bed placement.  CRITICAL CARE Performed by: Mariea Clonts   Total critical care time: 35 minutes  Critical care time was exclusive of separately billable procedures and treating other patients.  Critical care was necessary to treat or prevent imminent or life-threatening deterioration.  Critical care was time spent personally by me on the following activities: development of treatment plan with patient and/or surrogate as well as nursing, discussions with consultants, evaluation of patient's response to treatment, examination of patient, obtaining history from patient or surrogate, ordering and performing treatments and interventions, ordering and review of laboratory studies, ordering and review of radiographic studies, pulse oximetry and re-evaluation of patient's condition.  Hypoxia Fever   Kathleen Morrison, MD 01/23/20 1740

## 2020-01-23 NOTE — ED Notes (Signed)
Held pt po meds per hospitalist. Only to give asa and abilify if pt is alert enough to take.

## 2020-01-24 ENCOUNTER — Other Ambulatory Visit: Payer: Self-pay

## 2020-01-24 ENCOUNTER — Encounter (HOSPITAL_COMMUNITY): Payer: Self-pay | Admitting: Family Medicine

## 2020-01-24 DIAGNOSIS — J9691 Respiratory failure, unspecified with hypoxia: Secondary | ICD-10-CM | POA: Diagnosis present

## 2020-01-24 DIAGNOSIS — N136 Pyonephrosis: Secondary | ICD-10-CM | POA: Diagnosis not present

## 2020-01-24 DIAGNOSIS — J9621 Acute and chronic respiratory failure with hypoxia: Secondary | ICD-10-CM | POA: Diagnosis not present

## 2020-01-24 LAB — CBC
HCT: 40.6 % (ref 36.0–46.0)
Hemoglobin: 12.6 g/dL (ref 12.0–15.0)
MCH: 34.3 pg — ABNORMAL HIGH (ref 26.0–34.0)
MCHC: 31 g/dL (ref 30.0–36.0)
MCV: 110.6 fL — ABNORMAL HIGH (ref 80.0–100.0)
Platelets: 225 10*3/uL (ref 150–400)
RBC: 3.67 MIL/uL — ABNORMAL LOW (ref 3.87–5.11)
RDW: 16.1 % — ABNORMAL HIGH (ref 11.5–15.5)
WBC: 9.5 10*3/uL (ref 4.0–10.5)
nRBC: 0 % (ref 0.0–0.2)

## 2020-01-24 LAB — COMPREHENSIVE METABOLIC PANEL
ALT: 122 U/L — ABNORMAL HIGH (ref 0–44)
AST: 73 U/L — ABNORMAL HIGH (ref 15–41)
Albumin: 2.9 g/dL — ABNORMAL LOW (ref 3.5–5.0)
Alkaline Phosphatase: 142 U/L — ABNORMAL HIGH (ref 38–126)
Anion gap: 20 — ABNORMAL HIGH (ref 5–15)
BUN: 56 mg/dL — ABNORMAL HIGH (ref 8–23)
CO2: 20 mmol/L — ABNORMAL LOW (ref 22–32)
Calcium: 9.9 mg/dL (ref 8.9–10.3)
Chloride: 96 mmol/L — ABNORMAL LOW (ref 98–111)
Creatinine, Ser: 4 mg/dL — ABNORMAL HIGH (ref 0.44–1.00)
GFR calc Af Amer: 13 mL/min — ABNORMAL LOW (ref >60)
GFR calc non Af Amer: 11 mL/min — ABNORMAL LOW (ref >60)
Glucose, Bld: 353 mg/dL — ABNORMAL HIGH (ref 70–99)
Potassium: 5.1 mmol/L (ref 3.5–5.1)
Sodium: 136 mmol/L (ref 135–145)
Total Bilirubin: 1.1 mg/dL (ref 0.3–1.2)
Total Protein: 5.9 g/dL — ABNORMAL LOW (ref 6.5–8.1)

## 2020-01-24 LAB — HEPATITIS B SURFACE ANTIGEN: Hepatitis B Surface Ag: NONREACTIVE

## 2020-01-24 LAB — GLUCOSE, CAPILLARY
Glucose-Capillary: 301 mg/dL — ABNORMAL HIGH (ref 70–99)
Glucose-Capillary: 316 mg/dL — ABNORMAL HIGH (ref 70–99)

## 2020-01-24 LAB — MAGNESIUM: Magnesium: 2 mg/dL (ref 1.7–2.4)

## 2020-01-24 MED ORDER — METOPROLOL TARTRATE 5 MG/5ML IV SOLN
5.0000 mg | Freq: Once | INTRAVENOUS | Status: AC
  Administered 2020-01-24: 5 mg via INTRAVENOUS
  Filled 2020-01-24: qty 5

## 2020-01-24 MED ORDER — MAGNESIUM SULFATE 2 GM/50ML IV SOLN
2.0000 g | Freq: Once | INTRAVENOUS | Status: AC
  Administered 2020-01-24: 2 g via INTRAVENOUS
  Filled 2020-01-24: qty 50

## 2020-01-24 MED ORDER — HEPARIN SODIUM (PORCINE) 1000 UNIT/ML DIALYSIS
3000.0000 [IU] | INTRAMUSCULAR | Status: DC | PRN
  Filled 2020-01-24: qty 3

## 2020-01-24 MED ORDER — INSULIN ASPART 100 UNIT/ML ~~LOC~~ SOLN
0.0000 [IU] | Freq: Every day | SUBCUTANEOUS | Status: DC
  Administered 2020-01-24: 4 [IU] via SUBCUTANEOUS

## 2020-01-24 MED ORDER — VANCOMYCIN HCL IN DEXTROSE 1-5 GM/200ML-% IV SOLN
1000.0000 mg | INTRAVENOUS | Status: DC
  Administered 2020-01-24: 1000 mg via INTRAVENOUS
  Filled 2020-01-24: qty 200

## 2020-01-24 MED ORDER — DIGOXIN 0.25 MG/ML IJ SOLN
0.5000 mg | Freq: Once | INTRAMUSCULAR | Status: AC
  Administered 2020-01-24: 0.5 mg via INTRAVENOUS
  Filled 2020-01-24: qty 2

## 2020-01-24 MED ORDER — CHLORHEXIDINE GLUCONATE CLOTH 2 % EX PADS
6.0000 | MEDICATED_PAD | Freq: Every day | CUTANEOUS | Status: DC
  Administered 2020-01-25: 6 via TOPICAL

## 2020-01-24 MED ORDER — IPRATROPIUM BROMIDE 0.02 % IN SOLN: 0.5000 mg | Freq: Four times a day (QID) | RESPIRATORY_TRACT | Status: DC

## 2020-01-24 MED ORDER — IPRATROPIUM BROMIDE 0.02 % IN SOLN
0.5000 mg | Freq: Three times a day (TID) | RESPIRATORY_TRACT | Status: DC
  Administered 2020-01-25 (×2): 0.5 mg via RESPIRATORY_TRACT
  Filled 2020-01-24 (×2): qty 2.5

## 2020-01-24 MED ORDER — LEVALBUTEROL HCL 1.25 MG/0.5ML IN NEBU: 1.2500 mg | INHALATION_SOLUTION | Freq: Four times a day (QID) | RESPIRATORY_TRACT | Status: DC

## 2020-01-24 MED ORDER — INSULIN ASPART 100 UNIT/ML ~~LOC~~ SOLN
0.0000 [IU] | Freq: Three times a day (TID) | SUBCUTANEOUS | Status: DC
  Administered 2020-01-25: 12 [IU] via SUBCUTANEOUS
  Administered 2020-01-25: 18 [IU] via SUBCUTANEOUS

## 2020-01-24 MED ORDER — IPRATROPIUM BROMIDE 0.02 % IN SOLN
0.5000 mg | Freq: Four times a day (QID) | RESPIRATORY_TRACT | Status: DC
  Administered 2020-01-24 (×3): 0.5 mg via RESPIRATORY_TRACT
  Filled 2020-01-24 (×3): qty 2.5

## 2020-01-24 MED ORDER — SODIUM CHLORIDE 0.9 % IV SOLN: 100.0000 mL | INTRAVENOUS | Status: DC | PRN

## 2020-01-24 MED ORDER — HEPARIN SODIUM (PORCINE) 1000 UNIT/ML DIALYSIS: 1000.0000 [IU] | INTRAMUSCULAR | Status: DC | PRN

## 2020-01-24 MED ORDER — LEVALBUTEROL HCL 1.25 MG/0.5ML IN NEBU
1.2500 mg | INHALATION_SOLUTION | Freq: Three times a day (TID) | RESPIRATORY_TRACT | Status: DC
  Administered 2020-01-25 (×2): 1.25 mg via RESPIRATORY_TRACT
  Filled 2020-01-24 (×2): qty 0.5

## 2020-01-24 MED ORDER — LEVALBUTEROL HCL 0.63 MG/3ML IN NEBU: 0.6300 mg | INHALATION_SOLUTION | Freq: Four times a day (QID) | RESPIRATORY_TRACT | Status: DC | PRN

## 2020-01-24 MED ORDER — LEVALBUTEROL HCL 1.25 MG/0.5ML IN NEBU
1.2500 mg | INHALATION_SOLUTION | Freq: Four times a day (QID) | RESPIRATORY_TRACT | Status: DC
  Administered 2020-01-24 (×3): 1.25 mg via RESPIRATORY_TRACT
  Filled 2020-01-24 (×2): qty 0.5

## 2020-01-24 MED ORDER — LEVALBUTEROL HCL 1.25 MG/0.5ML IN NEBU
1.2500 mg | INHALATION_SOLUTION | Freq: Four times a day (QID) | RESPIRATORY_TRACT | Status: DC
  Filled 2020-01-24: qty 0.5

## 2020-01-24 MED ORDER — ALTEPLASE 2 MG IJ SOLR: 2.0000 mg | Freq: Once | INTRAMUSCULAR | Status: DC | PRN

## 2020-01-24 MED ORDER — DILTIAZEM HCL 25 MG/5ML IV SOLN
10.0000 mg | Freq: Once | INTRAVENOUS | Status: AC
  Administered 2020-01-24: 10 mg via INTRAVENOUS
  Filled 2020-01-24: qty 5

## 2020-01-24 NOTE — Progress Notes (Signed)
[facility] night shift.  The staff reported that the patient has been tachycardic ranging from the 120s to 140s.  She is currently BiPAP ventilation mode and sedated due to nighttime meds.  Diltiazem 10 mg IVP was given earlier with partial results.  Metoprolol 5 mg IVP has been given since the patient has been getting nebulized beta agonist.  Metoprolol improved the heart rate marginally, but still tachycardic.  I will try magnesium sulfate along with digoxin 0.5 mg IVP x1.  Albuterol has been changed to Xopenex.  Vent. rate 120 BPM PR interval * ms QRS duration 172 ms QT/QTc 386/546 ms P-R-T axes * 248 31 Atrial fibrillation RBBB and LAFB  Tennis Must, MD

## 2020-01-24 NOTE — Progress Notes (Signed)
Inpatient Diabetes Program Recommendations  AACE/ADA: New Consensus Statement on Inpatient Glycemic Control (2015)  Target Ranges:  Prepandial:   less than 140 mg/dL      Peak postprandial:   less than 180 mg/dL (1-2 hours)      Critically ill patients:  140 - 180 mg/dL   Lab Results  Component Value Date   GLUCAP 168 (H) 11/27/2019   HGBA1C 5.6 11/24/2019    Review of Glycemic Control  Diabetes history: Type 2 Outpatient Diabetes medications: Trulicity 1.5 mg weekly, has been on Tresiba 18 U daily and Humalog sliding scale TID in the past. Current orders for Inpatient glycemic control: none  Inpatient Diabetes Program Recommendations:   Noted that lab glucose was 353 mg/dl and on steroids.  Recommend adding Novolog SENSITIVE (0-9 units) correction scale every 4 hours while in the hospital.  Harvel Ricks RN BSN CDE Diabetes Coordinator Pager: [phone]  8am-5pm

## 2020-01-24 NOTE — Plan of Care (Signed)

## 2020-01-24 NOTE — Progress Notes (Signed)
**Note De-Identified  Obfuscation** Patient removed from BIPAP and placed on 3 l [place]; tolerating well.  RRT to continue to monitor.

## 2020-01-24 NOTE — Consult Note (Signed)
Reason for Consult: To manage dialysis and dialysis related needs Referring Physician: Dian Queen is an 69 y.o. female with past medical history significant for diabetes mellitus, hypertension, rheumatoid arthritis, COPD, diastolic heart failure with atrial fibrillation and AMS.  She also has ESRD-been started on dialysis within the last year.  She resides at [place] [facility] on a TTS schedule.  She presents to the emergency department with shortness of breath-she was found to be hypoxic currently on BiPAP.  Imaging shows a moderate sized right pleural effusion with atelectasis but no infiltrate.  We are consulted for her dialysis needs.  She is noted to be compliant with her dialysis-dry weight is listed at 85.5 kg.  Weight today is under that   Dialyzes at [facility]-TTS EDW 85.5.  4 hours HD [place] 2/2.5, Dialyzer unknown, Heparin does not appear to receive heparin with her treatment, only in the catheter. Access using TDC.  She does have a right upper arm AV fistula that was created in February of this year.  It seems a fistulogram has been ordered for later this month, she said they have not been able to use it.  No other medications during dialysis are known  Past Medical History:  Diagnosis Date  . Anemia   . Angina pectoris ([place])   . Anxiety   . Asthma   . Atrial flutter ([place])   . Back pain   . Barrett esophagus   . Bifascicular block   . BMI 39.0-39.9,adult   . Cellulitis   . CHF (congestive heart failure) ([place])   . Chronic diastolic CHF (congestive heart failure) ([place])   . Constipation   . Contact with and (suspected) exposure to covid-19   . COPD (chronic obstructive pulmonary disease) ([place])   . Dependence on renal dialysis ([place])   . Depression   . Dysphagia   . Dysuria   . Epilepsy ([place])   . ESRD on hemodialysis ([place])   . Essential hypertension   . Fibromyalgia   . Fibromyalgia   . GERD (gastroesophageal reflux disease)   . Gout    . History of pericarditis   . Hypercholesterolemia   . Hyperlipidemia   . Hypothyroid   . Hypothyroidism   . Insomnia   . Long term (current) use of insulin ([place])   . Major depressive disorder   . Memory loss   . Moderate protein-calorie malnutrition ([place])   . Morbid obesity ([place])   . Nephrolithiasis    2008  . Obesity hypoventilation syndrome (HCC)    CPAP  . Obstructive sleep apnea   . Pain in left arm   . PE (pulmonary embolism)    2010  . Peripheral neuropathy   . Persistent atrial fibrillation ([place])   . Polyp of nasal sinus   . PTSD (post-traumatic stress disorder)   . Pulmonary hypertension ([place])   . Rheumatoid arteritis ([place])   . Rheumatoid arthritis ([place])   . Schizophrenia ([place])   . 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 Seizures ([place])   . Steroid dependence ([place])   . Tachycardia, unspecified   . Type 2 diabetes mellitus ([place])   . Unspecified intellectual disabilities   . Unspecified psychosis not due to a substance or known physiological condition ([place])   . Urinary tract infection   . Vitamin D deficiency     Past Surgical History:  Procedure Laterality Date  . APPENDECTOMY    . AV FISTULA PLACEMENT Right 09/27/2019  Procedure: ARTERIOVENOUS (AV) FISTULA CREATION;  Surgeon: Waynetta Sandy, MD;  Location: [place];  Service: Vascular;  Laterality: Right;  . CHOLECYSTECTOMY    . COLONOSCOPY     benign polyps (12/2000), repeat colonoscopy performed in 2007  . ENDOMETRIAL BIOPSY     10/03 benign columnar mucosa (limited material)  . INSERTION OF DIALYSIS CATHETER Right 09/14/2019   Procedure: ATTEMPTED INSERTION OF DIALYSIS CATHETER;  Surgeon: Virl Cagey, MD;  Location: [facility];  Service: General;  Laterality: Right;  . INSERTION OF DIALYSIS CATHETER Right 09/27/2019   Procedure: INSERTION OF DIALYSIS CATHETER;  Surgeon: Waynetta Sandy, MD;  Location: [facility];  Service: Vascular;  Laterality: Right;  . KIDNEY SURGERY       Family History  Problem Relation Age of Onset  . Bone cancer Mother   . Hypertension Mother   . Heart disease Mother   . COPD Father   . Heart disease Father   . Stroke Father     Social History:  reports that she quit smoking about 47 years ago. Her smoking use included cigarettes. She has never used smokeless tobacco. She reports that she does not drink alcohol and does not use drugs.  Allergies:  Allergies  Allergen Reactions  . Benztropine Mesylate Other (See Comments)    Alopecia and white spots on eyes  . Codeine Swelling, Palpitations and Other (See Comments)    Mouth Swelling and Tachycardia   . Diazepam Other (See Comments)    COMA  . Diphenhydramine Hcl Anxiety, Palpitations and Other (See Comments)    Tachycardia and anxiousness  . Penicillins Swelling  . Sulfonamide Derivatives Rash    Blisters, also  . Thioridazine Hcl Swelling  . Lisinopril Cough    Medications: I have reviewed the patient's current medications.   Results for orders placed or performed during the hospital encounter of 01/23/20 (from the past 48 hour(s))  Lactic acid, plasma     Status: None   Collection Time: 01/23/20  2:45 PM  Result Value Ref Range   Lactic Acid, Venous 1.9 0.5 - 1.9 mmol/L    Comment: Performed at [facility], [address] [place], [place] [address]  Comprehensive metabolic panel     Status: Abnormal   Collection Time: 01/23/20  2:45 PM  Result Value Ref Range   Sodium 138 135 - 145 mmol/L   Potassium 3.6 3.5 - 5.1 mmol/L   Chloride 97 (L) 98 - 111 mmol/L   CO2 29 22 - 32 mmol/L   Glucose, Bld 249 (H) 70 - 99 mg/dL    Comment: Glucose reference range applies only to samples taken after fasting for at least 8 hours.   BUN 46 (H) 8 - 23 mg/dL   Creatinine, Ser 3.52 (H) 0.44 - 1.00 mg/dL   Calcium 10.7 (H) 8.9 - 10.3 mg/dL   Total Protein 6.2 (L) 6.5 - 8.1 g/dL   Albumin 3.0 (L) 3.5 - 5.0 g/dL   AST 213 (H) 15 - 41 U/L   ALT 169 (H) 0 - 44 U/L   Alkaline  Phosphatase 167 (H) 38 - 126 U/L   Total Bilirubin 0.5 0.3 - 1.2 mg/dL   GFR calc non Af Amer 13 (L) >60 mL/min   GFR calc Af Amer 15 (L) >60 mL/min   Anion gap 12 5 - 15    Comment: Performed at [facility], [address], [place],  [address]  CBC WITH DIFFERENTIAL     Status: Abnormal  Collection Time: 01/23/20  2:45 PM  Result Value Ref Range   WBC 10.7 (H) 4.0 - 10.5 K/uL   RBC 3.65 (L) 3.87 - 5.11 MIL/uL   Hemoglobin 12.4 12.0 - 15.0 g/dL   HCT 39.2 36 - 46 %   MCV 107.4 (H) 80.0 - 100.0 fL   MCH 34.0 26.0 - 34.0 pg   MCHC 31.6 30.0 - 36.0 g/dL   RDW 15.9 (H) 11.5 - 15.5 %   Platelets 242 150 - 400 K/uL   nRBC 0.0 0.0 - 0.2 %   Neutrophils Relative % 73 %   Neutro Abs 7.8 (H) 1.7 - 7.7 K/uL   Lymphocytes Relative 14 %   Lymphs Abs 1.5 0.7 - 4.0 K/uL   Monocytes Relative 9 %   Monocytes Absolute 1.0 0 - 1 K/uL   Eosinophils Relative 1 %   Eosinophils Absolute 0.1 0 - 0 K/uL   Basophils Relative 1 %   Basophils Absolute 0.1 0 - 0 K/uL   Immature Granulocytes 2 %   Abs Immature Granulocytes 0.19 (H) 0.00 - 0.07 K/uL    Comment: Performed at [facility], [address], [place], [place] [address]  APTT     Status: None   Collection Time: 01/23/20  2:45 PM  Result Value Ref Range   aPTT 33 24 - 36 seconds    Comment: Performed at [facility], [address] [place], [place] [address]  Protime-INR     Status: Abnormal   Collection Time: 01/23/20  2:45 PM  Result Value Ref Range   Prothrombin Time 17.3 (H) 11.4 - 15.2 seconds   INR 1.5 (H) 0.8 - 1.2    Comment: (NOTE) INR goal varies based on device and disease states. Performed at [facility], [address], [place], [place] [address]   Blood Culture (routine x 2)     Status: None (Preliminary result)   Collection Time: 01/23/20  2:45 PM   Specimen: BLOOD LEFT ARM  Result Value Ref Range   Specimen Description BLOOD LEFT ARM DRAWN BY RN    Special Requests      BOTTLES DRAWN AEROBIC AND ANAEROBIC Blood  Culture results may not be optimal due to an inadequate volume of blood received in culture bottles   Culture      NO GROWTH < 24 HOURS Performed at [facility], [address], [place], [place] [address]    Report Status PENDING   SARS Coronavirus 2 by RT PCR (hospital order, performed in [facility] hospital lab) Nasopharyngeal Urine, Catheterized     Status: None   Collection Time: 01/23/20  2:51 PM   Specimen: Urine, Catheterized; Nasopharyngeal  Result Value Ref Range   SARS Coronavirus 2 NEGATIVE NEGATIVE    Comment: (NOTE) SARS-CoV-2 target nucleic acids are NOT DETECTED. The SARS-CoV-2 RNA is generally detectable in upper and lower respiratory specimens during the acute phase of infection. The lowest concentration of SARS-CoV-2 viral copies this assay can detect is 250 copies / mL. A negative result does not preclude SARS-CoV-2 infection and should not be used as the sole basis for treatment or other patient management decisions.  A negative result may occur with improper specimen collection / handling, submission of specimen other than nasopharyngeal swab, presence of viral mutation(s) within the areas targeted by this assay, and inadequate number of viral copies (<250 copies / mL). A negative result must be combined with clinical observations, patient history, and epidemiological information. Fact Sheet for Patients:  StrictlyIdeas.no Fact Sheet for Healthcare Providers: BankingDealers.co.za This test is not yet approved or cleared  by the [place] FDA and has been authorized for detection and/or diagnosis of SARS-CoV-2 by FDA under an Emergency Use Authorization (EUA).  This EUA will remain in effect (meaning this test can be used) for the duration of the COVID-19 declaration under Section 564(b)(1) of the Act, 21 U.S.C. section 360bbb-3(b)(1), unless the authorization is terminated or revoked sooner. Performed at [facility], [address] [place], [place] [address]   Urinalysis, Routine w reflex microscopic     Status: Abnormal   Collection Time: 01/23/20  2:52 PM  Result Value Ref Range   Color, Urine YELLOW YELLOW   APPearance TURBID (A) CLEAR   Specific Gravity, Urine 1.016 1.005 - 1.030   pH 5.0 5.0 - 8.0   Glucose, UA >=500 (A) NEGATIVE mg/dL   Hgb urine dipstick LARGE (A) NEGATIVE   Bilirubin Urine NEGATIVE NEGATIVE   Ketones, ur NEGATIVE NEGATIVE mg/dL   Protein, ur >=300 (A) NEGATIVE mg/dL   Nitrite NEGATIVE NEGATIVE   Leukocytes,Ua MODERATE (A) NEGATIVE   RBC / HPF >50 (H) 0 - 5 RBC/hpf   WBC, UA >50 (H) 0 - 5 WBC/hpf   Bacteria, UA MANY (A) NONE SEEN   Squamous Epithelial / LPF 11-20 0 - 5   WBC Clumps PRESENT    Non Squamous Epithelial 0-5 (A) NONE SEEN    Comment: Performed at [facility], [address] [place], [place] [address]  Blood Culture (routine x 2)     Status: None (Preliminary result)   Collection Time: 01/23/20  3:09 PM   Specimen: BLOOD RIGHT HAND  Result Value Ref Range   Specimen Description BLOOD RIGHT HAND DRAWN BY RN    Special Requests      BOTTLES DRAWN AEROBIC AND ANAEROBIC Blood Culture results may not be optimal due to an inadequate volume of blood received in culture bottles   Culture      NO GROWTH < 24 HOURS Performed at [facility], [address], [place], [place] [address]    Report Status PENDING   Blood gas, arterial     Status: Abnormal   Collection Time: 01/23/20  8:04 PM  Result Value Ref Range   FIO2 60.00    pH, Arterial 7.357 7.35 - 7.45   pCO2 arterial 52.9 (H) 32 - 48 mmHg   pO2, Arterial 92.6 83 - 108 mmHg   Bicarbonate 27.1 20.0 - 28.0 mmol/L   Acid-Base Excess 3.9 (H) 0.0 - 2.0 mmol/L   O2 Saturation 96.3 %   Patient temperature 37.0    Collection site LEFT RADIAL    Drawn by [id number]    Allens test (pass/fail) PASS PASS    Comment: Performed at [facility], [address], [place],  [address]  Comprehensive metabolic  panel     Status: Abnormal   Collection Time: 01/24/20  3:41 AM  Result Value Ref Range   Sodium 136 135 - 145 mmol/L   Potassium 5.1 3.5 - 5.1 mmol/L    Comment: DELTA CHECK NOTED   Chloride 96 (L) 98 - 111 mmol/L   CO2 20 (L) 22 - 32 mmol/L   Glucose, Bld 353 (H) 70 - 99 mg/dL    Comment: Glucose reference range applies only to samples taken after fasting for at least 8 hours.   BUN 56 (H) 8 - 23 mg/dL   Creatinine, Ser 4.00 (H) 0.44 - 1.00 mg/dL  Calcium 9.9 8.9 - 10.3 mg/dL   Total Protein 5.9 (L) 6.5 - 8.1 g/dL   Albumin 2.9 (L) 3.5 - 5.0 g/dL   AST 73 (H) 15 - 41 U/L   ALT 122 (H) 0 - 44 U/L   Alkaline Phosphatase 142 (H) 38 - 126 U/L   Total Bilirubin 1.1 0.3 - 1.2 mg/dL   GFR calc non Af Amer 11 (L) >60 mL/min   GFR calc Af Amer 13 (L) >60 mL/min   Anion gap 20 (H) 5 - 15    Comment: Performed at [facility], [address], [place], [place] [address]  CBC     Status: Abnormal   Collection Time: 01/24/20  3:41 AM  Result Value Ref Range   WBC 9.5 4.0 - 10.5 K/uL   RBC 3.67 (L) 3.87 - 5.11 MIL/uL   Hemoglobin 12.6 12.0 - 15.0 g/dL   HCT 40.6 36 - 46 %   MCV 110.6 (H) 80.0 - 100.0 fL   MCH 34.3 (H) 26.0 - 34.0 pg   MCHC 31.0 30.0 - 36.0 g/dL   RDW 16.1 (H) 11.5 - 15.5 %   Platelets 225 150 - 400 K/uL   nRBC 0.0 0.0 - 0.2 %    Comment: Performed at [facility], [address] [place], [place] [address]  Magnesium     Status: None   Collection Time: 01/24/20  3:43 AM  Result Value Ref Range   Magnesium 2.0 1.7 - 2.4 mg/dL    Comment: Performed at [facility], [address], [place], [place] [address]    CT CHEST WO CONTRAST  Result Date: 01/23/2020 CLINICAL DATA:  Pneumonia. EXAM: CT CHEST WITHOUT CONTRAST TECHNIQUE: Multidetector CT imaging of the chest was performed following the standard protocol without IV contrast. COMPARISON:  September 08, 2019 FINDINGS: Cardiovascular: The heart size is enlarged. There are coronary artery calcifications. Aortic calcifications  are noted. There is a well-positioned tunneled dialysis catheter. There is no significant pericardial effusion. Mediastinum/Nodes: --No mediastinal or hilar lymphadenopathy. --No axillary lymphadenopathy. --No supraclavicular lymphadenopathy. --Normal thyroid gland. --The esophagus is unremarkable Lungs/Pleura: There is a moderate-sized right-sided pleural adjacent atelectasis. There is a trace left-sided pleural effusion. There is a amount of atelectasis at left base. There is no pneumothorax. Trachea is unremarkable. Upper Abdomen: No acute abnormality. Musculoskeletal: Appearance of the sternal body is likely secondary to motion artifact as opposed to a mildly displaced fracture. There is no acute displaced fracture identified on this study. Review of the MIP images confirms the above findings. IMPRESSION: 1. Moderate-sized right-sided pleural effusion with adjacent atelectasis. 2. Cardiomegaly with coronary artery disease. Aortic Atherosclerosis (ICD10-I70.0). Electronically Signed   By: Constance Holster M.D.   On: 01/23/2020 19:58   DG Chest Port 1 View  Result Date: 01/23/2020 CLINICAL DATA:  Hypoxia, shortness of breath and cough today, diabetes mellitus, end-stage renal disease, CHF, asthma, former smoker, history pulmonary embolism EXAM: PORTABLE CHEST 1 VIEW COMPARISON:  Portable exam 1453 hours compared to 11/22/2019 FINDINGS: LEFT jugular central venous catheter with tip projecting over SVC near cavoatrial junction. Enlargement of cardiac silhouette with pulmonary vascular congestion. Mediastinal contours normal. Minimal atelectasis RIGHT base. No definite infiltrate, pleural effusion or pneumothorax. Bones demineralized. IMPRESSION: Enlargement of cardiac silhouette with minimal RIGHT basilar atelectasis. No acute infiltrate. Electronically Signed   By: Lavonia Dana M.D.   On: 01/23/2020 15:18    ROS: Seems anxious, is short of breath.  Also complains of pain to her legs and feet.  Somewhat  confused Blood pressure (!) 115/97, pulse (!) 121, temperature 97.9 F (36.6 C), temperature source Axillary, resp. rate (!) 26, height 5\' 2"  (1.575 m), weight 85 kg, SpO2 100 %. General appearance: alert, moderate distress, pale and Anxious appearing Resp: diminished breath sounds bilaterally Cardio: Tachy and irregular GI: soft, non-tender; bowel sounds normal; no masses,  no organomegaly Extremities: edema Some pitting edema to dependent area TDC.  Also a right upper arm AV fistula that is small in nature but does have a positive thrill and bruit  Assessment/Plan: 69 year old female with multiple medical issues including ESRD.  She is presenting with respiratory failure 1 respiratory failure-patient has a history of COPD as well as PE.  Supportive care with BiPAP and COPD regimen right now per hosps.   Since she does have a pleural effusion-volume could have something to do with her presentation.  Will ultrafilter as able..... might need a thoracentesis if does not improve with HD 2 ESRD: Normally TTS at [facility].  We will plan for dialysis today on schedule with the most aggressive ultrafiltration that we can.  Will use TDC.  It does not appear that fistula is ready as fistulogram is ordered for later this month per [facility] 3 Hypertension/vol: Does appear some volume overloaded.  Is on moderate dose Cardizem and high-dose beta-blocker presumably for atrial fibrillation.  As her rate is high at this time will not modify medications 4. Anemia of ESRD: Not an issue today and does not seem to be an issue as outpatient as she is not on any ESA 5. Metabolic Bone Disease: Is not on medications for this at the outpatient kidney center-calcium acetate is listed as her binder-will hold for now as he is on BiPAP-check Phos in the morning   Louis Meckel 01/24/2020, 9:44 AM

## 2020-01-24 NOTE — Procedures (Signed)
     HEMODIALYSIS TREATMENT NOTE:   4 hour heparin-free HD completed via [facility] TDC; exit site unremarkable.  Goal met: 4 liters removed without interruption in UF.  All blood was returned.  Rockwell Alexandria, RN

## 2020-01-24 NOTE — Progress Notes (Signed)
Patient Demographics:    [name], is a 69 y.o. female, DOB - [birth year], EPP:[id number]  Admit date - 01/23/2020   Admitting Physician Estalee Mccandlish Denton Brick, MD  Outpatient Primary MD for the patient is Glenda Chroman, MD  LOS - 1   Chief Complaint  Patient presents with  . Shortness of Breath        Subjective:    [name] today has no fevers, no emesis,  No chest pain,   --Shortness of breath improving, able to come off BiPAP requesting food  Assessment  & Plan :    Principal Problem:   Acute on chronic respiratory failure with hypoxia (HCC) Active Problems:   Depression   Essential hypertension   Chronic diastolic heart failure (HCC)   History of pulmonary embolus (PE)   Type 2 diabetes mellitus (HCC)   Hypothyroidism   Seizure (HCC)   ESRD (end stage renal disease) (HCC)   Persistent atrial fibrillation (HCC)   Respiratory failure with hypoxia ([place])  69 y.o. female with past medical history significant for diabetes mellitus, hypertension, rheumatoid arthritis, COPD, diastolic heart failure with atrial fibrillation and AMS.  She also has ESRD-been started on dialysis within the last year.  She resides at [place] at [facility] on a TTS schedule.  Admitted on 01/23/2020 with acute hypoxic respiratory failure requiring BiPAP setting of volume overload and pleural effusions   A/p 1) acute hypoxic respiratory failure--improved on BiPAP, continue Vanco and cefepime as well as bronchodilators and Solu-Medrol for COPD exacerbation -Hemodialysis in a.m. to address volume status -If respiratory symptoms and hypoxia persist after hemodialysis may need thoracentesis  2) chronic atrial fibrillation--- continue metoprolol and Cardizem for rate control and Eliquis for secondary stroke prophylaxis  3)DM2-hold scheduled insulin, use sliding scale insulin for now until oral intake is  more reliable  4)ESRD--- now with volume overload, next HD 01/25/2020, nephrology consult appreciated  5)HFpEF--- history of chronic diastolic dysfunction CHF now with volume overload and hypoxic respiratory failure --- improved on BiPAP, hemodialysis in a.m. to address volume status  6)Possible UTI---h/o MDR, [place] on Vanco and cefepime  7)RA --- PTA was on chronic steroids, currently on Solu-Medrol for COPD as above #1  8) seizure disorder--- continue gabapentin, Keppra primidone  9)Schizophrenia and depression--- continue Wellbutrin, Cymbalta, trazodone and Abilify   Disposition/Need for in-Hospital Stay- patient unable to be discharged at this time due to --volume overload requiring BiPAP, UTI and possible pneumonia requiring IV antibiotics, will need aggressive hemodialysis session in a.m. to address volume status, may need therapeutic thoracentensis  Status is: Inpatient  Remains inpatient appropriate because:Ongoing diagnostic testing needed not appropriate for outpatient work up, IV treatments appropriate due to intensity of illness or inability to take PO and Inpatient level of care appropriate due to severity of illness   Disposition: The patient is from: SNF              Anticipated d/c is to: SNF              Anticipated d/c date is: 2 days              Patient currently is not medically stable to d/c. Barriers: Not Clinically Stable- volume overload requiring BiPAP,  UTI and possible pneumonia requiring IV antibiotics, will need aggressive hemodialysis session in a.m. to address volume status, may need therapeutic thoracentensis  Code Status : full  Family Communication:     (patient is alert, awake and coherent)   Consults  : Nephrologist  DVT Prophylaxis  : Eliquis- SCDs   Lab Results  Component Value Date   PLT 225 01/24/2020    Inpatient Medications  Scheduled Meds: . apixaban  5 mg Oral BID  . ARIPiprazole  2 mg Oral Daily  . aspirin EC  81 mg Oral Daily   . atorvastatin  20 mg Oral QHS  . buPROPion  75 mg Oral Daily  . Chlorhexidine Gluconate Cloth  6 each Topical Q0600  . diltiazem  360 mg Oral Daily  . DULoxetine  60 mg Oral Daily  . gabapentin  100 mg Oral Q8H  . insulin aspart  0-5 Units Subcutaneous QHS  . [START ON 01/25/2020] insulin aspart  0-9 Units Subcutaneous TID WC  . ipratropium  0.5 mg Nebulization Q6H  . levalbuterol  1.25 mg Nebulization Q6H  . levETIRAcetam  250 mg Oral BID  . levothyroxine  50 mcg Oral Q0600  . methylPREDNISolone (SOLU-MEDROL) injection  40 mg Intravenous Q6H  . metoprolol  200 mg Oral Daily  . montelukast  10 mg Oral QHS  . pantoprazole  40 mg Oral Daily  . primidone  50 mg Oral Daily  . traZODone  50 mg Oral QHS  . vancomycin variable dose per unstable renal function (pharmacist dosing)   Does not apply See admin instructions   Continuous Infusions: . sodium chloride    . sodium chloride    . ceFEPime (MAXIPIME) IV Stopped (01/23/20 2239)  . vancomycin 1,000 mg (01/24/20 1900)   PRN Meds:.sodium chloride, sodium chloride, acetaminophen **OR** acetaminophen, alteplase, heparin, levalbuterol    Anti-infectives (From admission, onward)   Start     Dose/Rate Route Frequency Ordered Stop   01/24/20 1600  vancomycin (VANCOCIN) IVPB 1000 mg/200 mL premix     Discontinue     1,000 mg 200 mL/hr over 60 Minutes Intravenous Every T-Th-Sa (Hemodialysis) 01/24/20 1322     01/23/20 2000  ceFEPIme (MAXIPIME) 1 g in sodium chloride 0.9 % 100 mL IVPB     Discontinue     1 g 200 mL/hr over 30 Minutes Intravenous Every 24 hours 01/23/20 1929     01/23/20 1945  vancomycin (VANCOREADY) IVPB 1750 mg/350 mL        1,750 mg 175 mL/hr over 120 Minutes Intravenous  Once 01/23/20 1929 01/23/20 2229   01/23/20 1930  vancomycin variable dose per unstable renal function (pharmacist dosing)     Discontinue      Does not apply See admin instructions 01/23/20 1930     01/23/20 1445  azithromycin (ZITHROMAX) 500 mg in  sodium chloride 0.9 % 250 mL IVPB  Status:  Discontinued        500 mg 250 mL/hr over 60 Minutes Intravenous Every 24 hours 01/23/20 1437 01/23/20 1928        Objective:   Vitals:   01/24/20 1800 01/24/20 1830 01/24/20 1900 01/24/20 1915  BP: (!) 128/59 (!) 157/61 (!) 116/54 128/66  Pulse: 90 92 (!) 112 90  Resp:      Temp:      TempSrc:      SpO2:      Weight:      Height:        Wt Readings from  Last 3 Encounters:  01/24/20 86.7 kg  01/21/20 85.7 kg  01/04/20 85.3 kg     Intake/Output Summary (Last 24 hours) at 01/24/2020 1920 Last data filed at 01/24/2020 1800 Gross per 24 hour  Intake 340 ml  Output --  Net 340 ml     Physical Exam  Gen:- Awake Alert, only able to speak in short sentences HEENT:- [place].AT, No sclera icterus Nose--initially on BiPAP transitioned to nasal cannula Neck-Supple Neck, +ve JVD,.  Lungs-diminished breath sounds, few scattered wheezes CV- S1, S2 normal, regular  Abd-  +ve B.Sounds, Abd Soft, No tenderness,    Extremity/Skin:- 1+  edema, pedal pulses present  Psych-affect is appropriate, oriented x3 Neuro-generalized weakness no new focal deficits, no tremors   Data Review:   Micro Results Recent Results (from the past 240 hour(s))  Blood Culture (routine x 2)     Status: None (Preliminary result)   Collection Time: 01/23/20  2:45 PM   Specimen: BLOOD LEFT ARM  Result Value Ref Range Status   Specimen Description BLOOD LEFT ARM DRAWN BY RN  Final   Special Requests   Final    BOTTLES DRAWN AEROBIC AND ANAEROBIC Blood Culture results may not be optimal due to an inadequate volume of blood received in culture bottles   Culture   Final    NO GROWTH < 24 HOURS Performed at [facility], [address] [place], [place] [address]    Report Status PENDING  Incomplete  SARS Coronavirus 2 by RT PCR (hospital order, performed in [facility] hospital lab) Nasopharyngeal Urine, Catheterized     Status: None   Collection Time: 01/23/20  2:51  PM   Specimen: Urine, Catheterized; Nasopharyngeal  Result Value Ref Range Status   SARS Coronavirus 2 NEGATIVE NEGATIVE Final    Comment: (NOTE) SARS-CoV-2 target nucleic acids are NOT DETECTED. The SARS-CoV-2 RNA is generally detectable in upper and lower respiratory specimens during the acute phase of infection. The lowest concentration of SARS-CoV-2 viral copies this assay can detect is 250 copies / mL. A negative result does not preclude SARS-CoV-2 infection and should not be used as the sole basis for treatment or other patient management decisions.  A negative result may occur with improper specimen collection / handling, submission of specimen other than nasopharyngeal swab, presence of viral mutation(s) within the areas targeted by this assay, and inadequate number of viral copies (<250 copies / mL). A negative result must be combined with clinical observations, patient history, and epidemiological information. Fact Sheet for Patients:   StrictlyIdeas.no Fact Sheet for Healthcare Providers: BankingDealers.co.za This test is not yet approved or cleared  by the [place] FDA and has been authorized for detection and/or diagnosis of SARS-CoV-2 by FDA under an Emergency Use Authorization (EUA).  This EUA will remain in effect (meaning this test can be used) for the duration of the COVID-19 declaration under Section 564(b)(1) of the Act, 21 U.S.C. section 360bbb-3(b)(1), unless the authorization is terminated or revoked sooner. Performed at [facility], [address], [place], [place] [address]   Blood Culture (routine x 2)     Status: None (Preliminary result)   Collection Time: 01/23/20  3:09 PM   Specimen: BLOOD RIGHT HAND  Result Value Ref Range Status   Specimen Description BLOOD RIGHT HAND DRAWN BY RN  Final   Special Requests   Final    BOTTLES DRAWN AEROBIC AND ANAEROBIC Blood Culture results may not be optimal due to  an inadequate volume of  blood received in culture bottles   Culture   Final    NO GROWTH < 24 HOURS Performed at [facility], [address], [place], [place] [address]    Report Status PENDING  Incomplete    Radiology Reports CT CHEST WO CONTRAST  Result Date: 01/23/2020 CLINICAL DATA:  Pneumonia. EXAM: CT CHEST WITHOUT CONTRAST TECHNIQUE: Multidetector CT imaging of the chest was performed following the standard protocol without IV contrast. COMPARISON:  September 08, 2019 FINDINGS: Cardiovascular: The heart size is enlarged. There are coronary artery calcifications. Aortic calcifications are noted. There is a well-positioned tunneled dialysis catheter. There is no significant pericardial effusion. Mediastinum/Nodes: --No mediastinal or hilar lymphadenopathy. --No axillary lymphadenopathy. --No supraclavicular lymphadenopathy. --Normal thyroid gland. --The esophagus is unremarkable Lungs/Pleura: There is a moderate-sized right-sided pleural adjacent atelectasis. There is a trace left-sided pleural effusion. There is a amount of atelectasis at left base. There is no pneumothorax. Trachea is unremarkable. Upper Abdomen: No acute abnormality. Musculoskeletal: Appearance of the sternal body is likely secondary to motion artifact as opposed to a mildly displaced fracture. There is no acute displaced fracture identified on this study. Review of the MIP images confirms the above findings. IMPRESSION: 1. Moderate-sized right-sided pleural effusion with adjacent atelectasis. 2. Cardiomegaly with coronary artery disease. Aortic Atherosclerosis (ICD10-I70.0). Electronically Signed   By: Constance Holster M.D.   On: 01/23/2020 19:58   DG Chest Port 1 View  Result Date: 01/23/2020 CLINICAL DATA:  Hypoxia, shortness of breath and cough today, diabetes mellitus, end-stage renal disease, CHF, asthma, former smoker, history pulmonary embolism EXAM: PORTABLE CHEST 1 VIEW COMPARISON:  Portable exam 1453 hours compared to  11/22/2019 FINDINGS: LEFT jugular central venous catheter with tip projecting over SVC near cavoatrial junction. Enlargement of cardiac silhouette with pulmonary vascular congestion. Mediastinal contours normal. Minimal atelectasis RIGHT base. No definite infiltrate, pleural effusion or pneumothorax. Bones demineralized. IMPRESSION: Enlargement of cardiac silhouette with minimal RIGHT basilar atelectasis. No acute infiltrate. Electronically Signed   By: Lavonia Dana M.D.   On: 01/23/2020 15:18     CBC Recent Labs  Lab 01/23/20 1445 01/24/20 0341  WBC 10.7* 9.5  HGB 12.4 12.6  HCT 39.2 40.6  PLT 242 225  MCV 107.4* 110.6*  MCH 34.0 34.3*  MCHC 31.6 31.0  RDW 15.9* 16.1*  LYMPHSABS 1.5  --   MONOABS 1.0  --   EOSABS 0.1  --   BASOSABS 0.1  --     Chemistries  Recent Labs  Lab 01/23/20 1445 01/24/20 0341 01/24/20 0343  NA 138 136  --   K 3.6 5.1  --   CL 97* 96*  --   CO2 29 20*  --   GLUCOSE 249* 353*  --   BUN 46* 56*  --   CREATININE 3.52* 4.00*  --   CALCIUM 10.7* 9.9  --   MG  --   --  2.0  AST 213* 73*  --   ALT 169* 122*  --   ALKPHOS 167* 142*  --   BILITOT 0.5 1.1  --    ------------------------------------------------------------------------------------------------------------------ No results for input(s): CHOL, HDL, LDLCALC, TRIG, CHOLHDL, LDLDIRECT in the last 72 hours.  Lab Results  Component Value Date   HGBA1C 5.6 11/24/2019   ------------------------------------------------------------------------------------------------------------------ No results for input(s): TSH, T4TOTAL, T3FREE, THYROIDAB in the last 72 hours.  Invalid input(s): FREET3 ------------------------------------------------------------------------------------------------------------------ No results for input(s): VITAMINB12, FOLATE, FERRITIN, TIBC, IRON, RETICCTPCT in the last 72 hours.  Coagulation profile Recent Labs  Lab 01/23/20  1445  INR 1.5*    No results for input(s):  DDIMER in the last 72 hours.  Cardiac Enzymes No results for input(s): CKMB, TROPONINI, MYOGLOBIN in the last 168 hours.  Invalid input(s): CK ------------------------------------------------------------------------------------------------------------------    Component Value Date/Time   BNP 1,131.0 (H) 11/22/2019 1418     Roxan Hockey M.D on 01/24/2020 at 7:20 PM  Go to [email or web address] - for contact info  [facility] - Office  [phone]

## 2020-01-25 DIAGNOSIS — J9621 Acute and chronic respiratory failure with hypoxia: Secondary | ICD-10-CM | POA: Diagnosis not present

## 2020-01-25 LAB — RENAL FUNCTION PANEL
Albumin: 2.8 g/dL — ABNORMAL LOW (ref 3.5–5.0)
Anion gap: 16 — ABNORMAL HIGH (ref 5–15)
BUN: 41 mg/dL — ABNORMAL HIGH (ref 8–23)
CO2: 26 mmol/L (ref 22–32)
Calcium: 8.7 mg/dL — ABNORMAL LOW (ref 8.9–10.3)
Chloride: 92 mmol/L — ABNORMAL LOW (ref 98–111)
Creatinine, Ser: 3.02 mg/dL — ABNORMAL HIGH (ref 0.44–1.00)
GFR calc Af Amer: 18 mL/min — ABNORMAL LOW (ref >60)
GFR calc non Af Amer: 15 mL/min — ABNORMAL LOW (ref >60)
Glucose, Bld: 473 mg/dL — ABNORMAL HIGH (ref 70–99)
Phosphorus: 2.9 mg/dL (ref 2.5–4.6)
Potassium: 4.1 mmol/L (ref 3.5–5.1)
Sodium: 134 mmol/L — ABNORMAL LOW (ref 135–145)

## 2020-01-25 LAB — GLUCOSE, CAPILLARY
Glucose-Capillary: 424 mg/dL — ABNORMAL HIGH (ref 70–99)
Glucose-Capillary: 469 mg/dL — ABNORMAL HIGH (ref 70–99)
Glucose-Capillary: 457 mg/dL — ABNORMAL HIGH (ref 70–99)

## 2020-01-25 MED ORDER — SODIUM CHLORIDE 0.9 % IV SOLN
2.0000 g | INTRAVENOUS | Status: DC
  Filled 2020-01-25: qty 2

## 2020-01-25 MED ORDER — FOSFOMYCIN TROMETHAMINE 3 G PO PACK
3.0000 g | PACK | Freq: Once | ORAL | Status: AC
  Administered 2020-01-25: 3 g via ORAL
  Filled 2020-01-25: qty 3

## 2020-01-25 MED ORDER — FOSFOMYCIN TROMETHAMINE 3 G PO PACK: 3.0000 g | PACK | Freq: Once | ORAL | 0 refills | Status: AC

## 2020-01-25 MED ORDER — ASPIRIN EC 81 MG PO TBEC: 81.0000 mg | DELAYED_RELEASE_TABLET | Freq: Every day | ORAL | 11 refills | Status: AC

## 2020-01-25 MED ORDER — CHLORHEXIDINE GLUCONATE CLOTH 2 % EX PADS
6.0000 | MEDICATED_PAD | Freq: Every day | CUTANEOUS | Status: DC
  Administered 2020-01-25: 6 via TOPICAL

## 2020-01-25 MED ORDER — SENNOSIDES-DOCUSATE SODIUM 8.6-50 MG PO TABS
2.0000 | ORAL_TABLET | Freq: Two times a day (BID) | ORAL | Status: DC
  Administered 2020-01-25: 2 via ORAL
  Filled 2020-01-25: qty 2

## 2020-01-25 NOTE — NC FL2 (Signed)
[place] LEVEL OF CARE SCREENING TOOL     IDENTIFICATION  Patient Name: [name] Birthdate: October 23, 1950 Sex: female Admission Date (Current Location): 01/23/2020  [place] and [place] Number:  [place] [facility] [id number] [place] and Address:  [facility] [address] [place]      Provider Number: [id number]  Attending Physician Name and Address:  Roxan Hockey, MD  Relative Name and Phone Number:  [name] [facility]: [phone]    Current Level of Care: Hospital Recommended Level of Care: Nursing Facility Prior Approval Number:    Date Approved/Denied:   PASRR Number:    Discharge Plan: Other (Comment) [facility] for LTC)    Current Diagnoses: Patient Active Problem List   Diagnosis Date Noted  . Respiratory failure with hypoxia ([place]) 01/24/2020  . Hypoxia 01/23/2020  . Acute on chronic respiratory failure with hypoxia ([place]) 01/23/2020  . Persistent atrial fibrillation ([place])   . Cellulitis and abscess of right leg 11/23/2019  . ESRD (end stage renal disease) ([place]) 11/23/2019  . Elevated troponin   . Atrial fibrillation with rapid ventricular response ([place]) 11/22/2019  . Acute pulmonary edema (HCC)   . Acute respiratory failure with hypoxia and hypercapnia ([place]) 09/10/2019  . SVT (supraventricular tachycardia) ([place]) 09/10/2019  . On mechanically assisted ventilation ([place]) 09/08/2019  . Acute metabolic encephalopathy 82/42/3536  . Volume overload 09/06/2019  . AKI (acute kidney injury) ([place]) 09/06/2019  . Altered mental state 11/28/2018  . Near syncope 11/28/2018  . Fall at home 11/28/2018  . History of seizure 11/28/2018  . 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 Pressure injury of skin 09/17/2018  . Respiratory failure ([place]) 08/16/2018  . Seizure ([place]) 08/16/2018  . Acute encephalopathy 07/27/2018  . Endotracheally intubated 07/27/2018  . Hypothyroidism 07/27/2018  . Pleuritic chest pain 07/14/2018   . Moderate persistent asthma without complication   . Type 2 diabetes mellitus ([employer] [employer]) 04/19/2018  . Acute kidney injury superimposed on chronic kidney disease ([place]) 01/13/2018  . Altered mental status   . Encephalopathy 10/11/2016  . Chest pain 08/12/2016  . CKD (chronic kidney disease), stage IV ([place]) 08/12/2016  . History of pulmonary embolus (PE) 08/12/2016  . Obesity, Class III, BMI 40-49.9 (morbid obesity) ([place]) 08/12/2016  . RBBB 08/12/2016  . Positive D dimer 08/12/2016  . Cerebral infarction ([place]) 11/10/2015  . Physical deconditioning 07/01/2011  . Weakness 07/01/2011  . Diabetic foot ulcer ([place]) 05/19/2011  . Polypharmacy 02/09/2011  . BACK PAIN WITH RADICULOPATHY 05/25/2010  . Type 2 diabetes mellitus with diabetic polyneuropathy, without long-term current use of insulin ([place]) 08/18/2009  . Chronic diastolic heart failure ([place]) 04/25/2009  . Normocytic anemia 04/14/2009  . Sleep apnea 12/11/2008  . Diabetic peripheral neuropathy ([place]) 05/04/2007  . HYPERCHOLESTEROLEMIA 10/13/2006  . Gout, unspecified 10/13/2006  . HYPOKALEMIA 10/13/2006  . Schizophrenia ([place]) 10/13/2006  . Depression 10/13/2006  . Essential hypertension 10/13/2006  . Venous (peripheral) insufficiency 10/13/2006  . RHINITIS, ALLERGIC 10/13/2006  . Asthma 10/13/2006  . Reflux esophagitis 10/13/2006  . OSTEOARTHRITIS, MULTI SITES 10/13/2006  . INCONTINENCE, URGE 10/13/2006    Orientation RESPIRATION BLADDER Height & Weight     Self, Situation, Place  Normal Incontinent Weight: 191 lb 2.2 oz (86.7 kg) Height:  5\' 2"  (157.5 cm)  BEHAVIORAL SYMPTOMS/MOOD NEUROLOGICAL BOWEL NUTRITION STATUS      Continent Diet (Car[place] modified)  AMBULATORY STATUS COMMUNICATION OF NEEDS Skin   Limited Assist Verbally Other (Comment) (Rash (around port); Ecchymosis (scattered))  Personal Care Assistance Level of Assistance  Bathing, Feeding, Dressing Bathing Assistance: Limited  assistance Feeding assistance: Independent Dressing Assistance: Limited assistance     Functional Limitations Info  Sight, Hearing, Speech Sight Info: Impaired (Wears glasses) Hearing Info: Adequate Speech Info: Adequate    SPECIAL CARE FACTORS FREQUENCY                       Contractures Contractures Info: Not present    Additional Factors Info  Code Status, Allergies, Psychotropic Code Status Info: Full Allergies Info: Benztropine Mesylate; Codeine; Diazepam; Diphenhydramine Hcl; Penicillins; Sulfonamide Derivatives; Thioridazine Hcl; Lisinopril Psychotropic Info: Abilify (aripiprazole); Wellbutrin (bupropion); Neurontin (gabapentin); Cymbalta (duloxetine)         Current Medications (01/25/2020):  This is the current hospital active medication list Current Facility-Administered Medications  Medication Dose Route Frequency Provider Last Rate Last Admin  . 0.9 %  sodium chloride infusion  100 mL Intravenous PRN Corliss Parish, MD      . 0.9 %  sodium chloride infusion  100 mL Intravenous PRN Corliss Parish, MD      . acetaminophen (TYLENOL) tablet 650 mg  650 mg Oral Q6H PRN Vashti Hey, MD   650 mg at 01/25/20 3382   Or  . acetaminophen (TYLENOL) suppository 650 mg  650 mg Rectal Q6H PRN Bonnell Public Tublu, MD      . alteplase (CATHFLO ACTIVASE) injection 2 mg  2 mg Intracatheter Once PRN Corliss Parish, MD      . apixaban [facility]) tablet 5 mg  5 mg Oral BID Vashti Hey, MD   5 mg at 01/25/20 5053  . ARIPiprazole (ABILIFY) tablet 2 mg  2 mg Oral Daily Bonnell Public Tublu, MD   2 mg at 01/25/20 1308  . aspirin EC tablet 81 mg  81 mg Oral Daily Bonnell Public Tublu, MD   81 mg at 01/25/20 0832  . atorvastatin (LIPITOR) tablet 20 mg  20 mg Oral QHS Bonnell Public Tublu, MD   20 mg at 01/24/20 2142  . buPROPion [facility]) tablet 75 mg  75 mg Oral Daily Bonnell Public Tublu, MD   75 mg at 01/24/20  1246  . [START ON 01/26/2020] ceFEPIme (MAXIPIME) 2 g in sodium chloride 0.9 % 100 mL IVPB  2 g Intravenous Q T,Th,Sa-HD Emokpae, Courage, MD      . Chlorhexidine Gluconate Cloth 2 % PADS 6 each  6 each Topical Q0600 Corliss Parish, MD   6 each at 01/25/20 1357  . Chlorhexidine Gluconate Cloth 2 % PADS 6 each  6 each Topical Q0600 Corliss Parish, MD   6 each at 01/25/20 1257  . diltiazem (CARDIZEM CD) 24 hr capsule 360 mg  360 mg Oral Daily Bonnell Public Tublu, MD   360 mg at 01/25/20 0833  . DULoxetine (CYMBALTA) DR capsule 60 mg  60 mg Oral Daily Bonnell Public Tublu, MD   60 mg at 01/25/20 0832  . fosfomycin (MONUROL) packet 3 g  3 g Oral Once Emokpae, Courage, MD      . gabapentin (NEURONTIN) capsule 100 mg  100 mg Oral Q8H Bonnell Public Tublu, MD   100 mg at 01/25/20 9767  . heparin injection 3,000 Units  3,000 Units Dialysis PRN Corliss Parish, MD      . insulin aspart (novoLOG) injection 0-5 Units  0-5 Units Subcutaneous QHS Roxan Hockey, MD   4 Units at 01/24/20 2150  . insulin aspart (novoLOG) injection 0-9 Units  0-9 Units Subcutaneous  TID WC Roxan Hockey, MD   18 Units at 01/25/20 1258  . ipratropium (ATROVENT) nebulizer solution 0.5 mg  0.5 mg Nebulization TID Emokpae, Courage, MD   0.5 mg at 01/25/20 1420  . levalbuterol (XOPENEX) nebulizer solution 0.63 mg  0.63 mg Nebulization Q6H PRN Reubin Milan, MD      . levalbuterol [facility]) nebulizer solution 1.25 mg  1.25 mg Nebulization TID Roxan Hockey, MD   1.25 mg at 01/25/20 1421  . levETIRAcetam (KEPPRA) tablet 250 mg  250 mg Oral BID Bonnell Public Tublu, MD   250 mg at 01/25/20 0833  . levothyroxine (SYNTHROID) tablet 50 mcg  50 mcg Oral Q0600 Vashti Hey, MD   50 mcg at 01/25/20 0614  . methylPREDNISolone sodium succinate (SOLU-MEDROL) 40 mg/mL injection 40 mg  40 mg Intravenous Q6H Bonnell Public Tublu, MD   40 mg at 01/25/20 1308  . metoprolol succinate  (TOPROL-XL) 24 hr tablet 200 mg  200 mg Oral Daily Bonnell Public Tublu, MD   200 mg at 01/25/20 0832  . montelukast (SINGULAIR) tablet 10 mg  10 mg Oral QHS Bonnell Public Tublu, MD   10 mg at 01/24/20 2142  . pantoprazole (PROTONIX) EC tablet 40 mg  40 mg Oral Daily Bonnell Public Tublu, MD   40 mg at 01/25/20 0833  . primidone (MYSOLINE) tablet 50 mg  50 mg Oral Daily Bonnell Public Tublu, MD   50 mg at 01/25/20 0831  . senna-docusate (Senokot-S) tablet 2 tablet  2 tablet Oral BID Roxan Hockey, MD   2 tablet at 01/25/20 1308  . traZODone (DESYREL) tablet 50 mg  50 mg Oral QHS Bonnell Public Tublu, MD   50 mg at 01/24/20 2142     Discharge Medications: Please see discharge summary for a list of discharge medications.  Relevant Imaging Results:  Relevant Lab Results:   Additional Information SSN#: [id number]  Sherie Don, LCSW

## 2020-01-25 NOTE — Discharge Instructions (Signed)
1)Very low-salt diet advised 2)Weigh yourself daily, call if you gain more than 3 pounds in 1 day or more than 5 pounds in 1 week as your hemodialysis dry weight may need to be adjusted 3)Limit your Fluid  intake to no more than 50 ounces (1.5 Liters) per day 4) please take fosfomycin/Monurol 3 g orally 1 time on Monday, 01/28/2020 to complete your antibiotic treatment for urinary tract infection 5) please continue hemodialysis on Tuesdays Thursdays and Saturdays per your usual schedule

## 2020-01-25 NOTE — Discharge Summary (Signed)
[name], is a 69 y.o. female  DOB [birth year]  MRN [id number].  Admission date:  01/23/2020  Admitting Physician  Roxan Hockey, MD  Discharge Date:  01/25/2020   Primary MD  Glenda Chroman, MD  Recommendations for primary care physician for things to follow:   1)Very low-salt diet advised 2)Weigh yourself daily, call if you gain more than 3 pounds in 1 day or more than 5 pounds in 1 week as your hemodialysis dry weight may need to be adjusted 3)Limit your Fluid  intake to no more than 50 ounces (1.5 Liters) per day 4)Please take fosfomycin/Monurol 3 g orally 1 time on Monday, 01/28/2020 to complete your antibiotic treatment for urinary tract infection 5)Please continue hemodialysis on Tuesdays Thursdays and Saturdays per your usual schedule  Admission Diagnosis  Pyelonephritis [N12] Hypoxia [R09.02] Respiratory failure with hypoxia ([place]) [J96.91] Sepsis, due to unspecified organism, unspecified whether acute organ dysfunction present [facility]) [A41.9]   Discharge Diagnosis  Pyelonephritis [N12] Hypoxia [R09.02] Respiratory failure with hypoxia ([place]) [J96.91] Sepsis, due to unspecified organism, unspecified whether acute organ dysfunction present ([place]) [A41.9]   Principal Problem:   Acute on chronic respiratory failure with hypoxia ([place]) Active Problems:   Depression   Essential hypertension   Chronic diastolic heart failure (HCC)   History of pulmonary embolus (PE)   Type 2 diabetes mellitus (HCC)   Hypothyroidism   Seizure (HCC)   ESRD (end stage renal disease) ([place])   Persistent atrial fibrillation (HCC)   Respiratory failure with hypoxia ([place])      Past Medical History:  Diagnosis Date  . Anemia   . Angina pectoris ([place])   . Anxiety   . Asthma   . Atrial flutter ([place])   . Back pain   . Barrett esophagus   . Bifascicular block   . BMI 39.0-39.9,adult   . Cellulitis   . CHF  (congestive heart failure) ([place])   . Chronic diastolic CHF (congestive heart failure) ([place])   . Constipation   . Contact with and (suspected) exposure to covid-19   . COPD (chronic obstructive pulmonary disease) ([place])   . Dependence on renal dialysis ([place])   . Depression   . Dysphagia   . Dysuria   . Epilepsy ([place])   . ESRD on hemodialysis ([place])   . Essential hypertension   . Fibromyalgia   . Fibromyalgia   . GERD (gastroesophageal reflux disease)   . Gout   . History of pericarditis   . Hypercholesterolemia   . Hyperlipidemia   . Hypothyroid   . Hypothyroidism   . Insomnia   . Long term (current) use of insulin ([place])   . Major depressive disorder   . Memory loss   . Moderate protein-calorie malnutrition ([place])   . Morbid obesity ([place])   . Nephrolithiasis    2008  . Obesity hypoventilation syndrome (HCC)    CPAP  . Obstructive sleep apnea   . Pain in left arm   . PE (pulmonary embolism)    2010  .  Peripheral neuropathy   . Persistent atrial fibrillation ([place])   . Polyp of nasal sinus   . PTSD (post-traumatic stress disorder)   . Pulmonary hypertension ([place])   . Rheumatoid arteritis ([place])   . Rheumatoid arthritis ([place])   . Schizophrenia ([place])   . 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 Seizures ([place])   . 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 Tachycardia, unspecified   . Type 2 diabetes mellitus ([place])   . Unspecified intellectual disabilities   . Unspecified psychosis not due to a substance or known physiological condition ([place])   . Urinary tract infection   . Vitamin D deficiency     Past Surgical History:  Procedure Laterality Date  . APPENDECTOMY    . AV FISTULA PLACEMENT Right 09/27/2019   Procedure: ARTERIOVENOUS (AV) FISTULA CREATION;  Surgeon: Waynetta Sandy, MD;  Location: [facility];  Service: Vascular;  Laterality: Right;  . CHOLECYSTECTOMY    . COLONOSCOPY     benign polyps (12/2000), repeat colonoscopy performed in 2007  . ENDOMETRIAL BIOPSY       10/03 benign columnar mucosa (limited material)  . INSERTION OF DIALYSIS CATHETER Right 09/14/2019   Procedure: ATTEMPTED INSERTION OF DIALYSIS CATHETER;  Surgeon: Virl Cagey, MD;  Location: [facility];  Service: General;  Laterality: Right;  . INSERTION OF DIALYSIS CATHETER Right 09/27/2019   Procedure: INSERTION OF DIALYSIS CATHETER;  Surgeon: Waynetta Sandy, MD;  Location: [facility];  Service: Vascular;  Laterality: Right;  . KIDNEY SURGERY       HPI  from the history and physical done on the day of admission:   [name] is an 69 y.o. female nursing home resident with PMH significant for acute respiratory failure requiring intubation secondary to COPD and pneumonia, ESRD on HD, diastolic heart failure, PAF, moderate AAS, history of PE, DM 2, OHS, HTN, rheumatoid arthritis, unspecified psychiatric disorder, and seizure disorder who was sent to ED due to shortness of breath.   Patient tells me that she has been feeling unwell for a while but is unable to specify how long.  Patient tells me today she was unable to catch her breath at all which is why she came to the ED.  Patient states she has had sweats recently.  No change in baseline cough.  Patient is unable to provide further history.  She denies chest pain.  Denies nausea vomiting or abdominal pain.  Patient admits she is tired of breathing.  ED Course:  The patient was found to have a UTI and to be hypoxic.  She was placed on 100% nonrebreather.  Chest x-ray was found to have atelectasis.  She was treated with azithromycin.      Hospital Course:       Brief Summary:- 69 y.o.femalewith past medical history significant for diabetes mellitus, hypertension, rheumatoid arthritis, COPD, diastolic heart failure with atrial fibrillation and AMS. She also has ESRD-been started on dialysis within the last year. She resides at [facility] at [facility] on a TTS schedule.  Admitted on 01/23/2020 with acute  hypoxic respiratory failure requiring BiPAP setting of volume overload and pleural effusions   A/p 1) acute hypoxic respiratory failure--presumed secondary to volume overload, on admission patient had significant hypoxia and respiratory distress, chest imaging studies with moderate right-sided pleural effusion  --hypoxia resolved after hemodialysis  -Patient no longer needs BiPAP, -Patient no longer requiring supplemental oxygen -treated with Vanco and cefepime as well as bronchodilators and Solu-Medrol for COPD  exacerbation - --If symptoms becomes recurrent,-If respiratory symptoms and hypoxia persist despite regular pack schedule hemodialysis may need thoracentesis  2) chronic atrial fibrillation--- continue metoprolol and Cardizem for rate control and Eliquis for secondary stroke prophylaxis  3)DM2-resume home regimen  4)ESRD--- patient had HD 01/24/2020 for 4 hours with removal of 4 L ---, nephrology consult appreciated, next HD as outpatient on 01/26/2020  5)HFpEF--- history of chronic diastolic dysfunction CHF now with volume overload and hypoxic respiratory failure --- improved on BiPAP, hemodialysis in a.m. to address volume status  6) presumed UTI---h/o MDR,  treated with Vanco and cefepime while in the hospital -Patient received 1 dose of fosfomycin/Monurol on 01/25/2020 with a repeat dose on Monday, 01/28/2020 to complete treatment = Final off repeat urine culture and sensitivities pending  7)RA --- PTA was on chronic steroids, c continue prednisone  8) seizure disorder--- continue gabapentin, Keppra primidone  9)Schizophrenia and depression--- continue Wellbutrin, Cymbalta, trazodone and Abilify   Disposition--discharge back to SNF  Disposition: The patient is from: SNF  Anticipated d/c is to: SNF  Code Status : full  Family Communication:     (patient is alert, awake and coherent)   Consults  : Nephrologist  DVT Prophylaxis  :  Eliquis- SCDs   Discharge Condition: stable, without hypoxia  Follow UP--- PCP at SNF   Consults obtained -nephrology Diet and Activity recommendation:  As advised  Discharge Instructions    Discharge Instructions    Call MD for:  difficulty breathing, headache or visual disturbances   Complete by: As directed    Call MD for:  extreme fatigue   Complete by: As directed    Call MD for:  persistant dizziness or light-headedness   Complete by: As directed    Call MD for:  persistant nausea and vomiting   Complete by: As directed    Call MD for:  temperature >100.4   Complete by: As directed    Diet - low sodium heart healthy   Complete by: As directed    Discharge instructions   Complete by: As directed    1)Very low-salt diet advised 2)Weigh yourself daily, call if you gain more than 3 pounds in 1 day or more than 5 pounds in 1 week as your hemodialysis dry weight may need to be adjusted 3)Limit your Fluid  intake to no more than 50 ounces (1.5 Liters) per day 4) please take fosfomycin/Monurol 3 g orally 1 time on Monday, 01/28/2020 to complete your antibiotic treatment for urinary tract infection 5) please continue hemodialysis on Tuesdays Thursdays and Saturdays per your usual schedule   Increase activity slowly   Complete by: As directed    No wound care   Complete by: As directed         Discharge Medications     Allergies as of 01/25/2020      Reactions   Benztropine Mesylate Other (See Comments)   Alopecia and white spots on eyes   Codeine Swelling, Palpitations, Other (See Comments)   Mouth Swelling and Tachycardia   Diazepam Other (See Comments)   COMA   Diphenhydramine Hcl Anxiety, Palpitations, Other (See Comments)   Tachycardia and anxiousness   Penicillins Swelling   Sulfonamide Derivatives Rash   Blisters, also   Thioridazine Hcl Swelling   Lisinopril Cough      Medication List    STOP taking these medications   cephALEXin 500 MG  capsule Commonly known as: KEFLEX   furosemide 20 MG tablet Commonly known as: LASIX  TAKE these medications   acetaminophen 325 MG tablet Commonly known as: TYLENOL Take 325 mg by mouth 2 (two) times daily.   albuterol 108 (90 Base) MCG/ACT inhaler Commonly known as: VENTOLIN HFA Inhale 1 puff into the lungs every 6 (six) hours as needed for wheezing or shortness of breath.   AMINO ACID PROTEIN PO Take 30 mLs by mouth in the morning and at bedtime.   apixaban 5 MG Tabs tablet Commonly known as: ELIQUIS Take 1 tablet (5 mg total) by mouth 2 (two) times daily.   ARIPiprazole 2 MG tablet Commonly known as: ABILIFY Take 2 mg by mouth daily.   aspirin EC 81 MG tablet Take 1 tablet (81 mg total) by mouth daily with breakfast. What changed: when to take this   atorvastatin 20 MG tablet Commonly known as: LIPITOR Take 20 mg by mouth at bedtime.   buPROPion 75 MG tablet Commonly known as: WELLBUTRIN Take 75 mg by mouth daily.   calcitRIOL 0.25 MCG capsule Commonly known as: ROCALTROL Take 1 capsule (0.25 mcg total) by mouth every Monday, Wednesday, and Friday.   Calcium Acetate 667 MG Tabs Take 2,001 mg by mouth 3 (three) times daily.   calcium carbonate 500 MG chewable tablet Commonly known as: TUMS - dosed in mg elemental calcium Chew 500 mg by mouth daily as needed for indigestion or heartburn.   Cardizem LA 360 MG 24 hr tablet Generic drug: diltiazem Take 360 mg by mouth daily.   cetaphil cream Apply 1 application topically daily as needed (Apply to legs and feet for hydration).   DAILY VITE PO Take 1 tablet by mouth every morning.   Dextromethorphan-guaiFENesin 10-100 MG/5ML liquid Take 5 mLs by mouth daily as needed (cough).   DULoxetine 60 MG capsule Commonly known as: CYMBALTA Take 60 mg by mouth daily.   EPINEPHrine 0.3 mg/0.3 mL Devi Commonly known as: EPI-PEN Inject 0.3 mg into the muscle once as needed (for bee stings, then go to the ED  immediately after using).   fosfomycin 3 g Pack Commonly known as: MONUROL Take 3 g by mouth once for 1 dose. Take one-time dose on Monday, 01/28/2020 Start taking on: January 28, 2020   gabapentin 100 MG capsule Commonly known as: NEURONTIN Take 1 capsule (100 mg total) by mouth 3 (three) times daily. What changed: when to take this   insulin lispro 100 UNIT/ML injection Commonly known as: HUMALOG Inject 10 Units into the skin 3 (three) times daily before meals. Additional units according to sliding scale 151-200= 2 units 201-250= 4 units 251-300= 6 units 301-350= 8 units 351-400=10units 401-450=12units >400- call MD   levETIRAcetam 250 MG tablet Commonly known as: KEPPRA Take 250 mg by mouth 2 (two) times daily.   levothyroxine 50 MCG tablet Commonly known as: SYNTHROID Take 50 mcg by mouth daily before breakfast.   metoprolol 200 MG 24 hr tablet Commonly known as: TOPROL-XL Take 1 tablet (200 mg total) by mouth daily. Take with or immediately following a meal.   montelukast 10 MG tablet Commonly known as: SINGULAIR Take 10 mg by mouth at bedtime.   nitroGLYCERIN 0.4 MG SL tablet Commonly known as: NITROSTAT Place 0.4 mg under the tongue every 5 (five) minutes x 3 doses as needed.   omeprazole 20 MG capsule Commonly known as: PRILOSEC Take 20 mg by mouth every morning.   predniSONE 10 MG tablet Commonly known as: DELTASONE Take 10 mg by mouth daily.   primidone 50 MG tablet Commonly known  as: MYSOLINE Take 50 mg by mouth daily.   pyridOXINE 100 MG tablet Commonly known as: VITAMIN B-6 Take 200 mg by mouth 2 (two) times daily.   senna 8.6 MG tablet Commonly known as: SENOKOT Take 1 tablet by mouth 2 (two) times daily.   traZODone 50 MG tablet Commonly known as: DESYREL Take 50 mg by mouth at bedtime.   Trulicity 1.5 FX/9.0WI Sopn Generic drug: Dulaglutide Inject 1.5 mg into the skin every Friday.   Vitamin D3 50 MCG (2000 UT) Tabs Take 4,000 Units  by mouth daily.       Major procedures and Radiology Reports - PLEASE review detailed and final reports for all details, in brief -   CT CHEST WO CONTRAST  Result Date: 01/23/2020 CLINICAL DATA:  Pneumonia. EXAM: CT CHEST WITHOUT CONTRAST TECHNIQUE: Multidetector CT imaging of the chest was performed following the standard protocol without IV contrast. COMPARISON:  September 08, 2019 FINDINGS: Cardiovascular: The heart size is enlarged. There are coronary artery calcifications. Aortic calcifications are noted. There is a well-positioned tunneled dialysis catheter. There is no significant pericardial effusion. Mediastinum/Nodes: --No mediastinal or hilar lymphadenopathy. --No axillary lymphadenopathy. --No supraclavicular lymphadenopathy. --Normal thyroid gland. --The esophagus is unremarkable Lungs/Pleura: There is a moderate-sized right-sided pleural adjacent atelectasis. There is a trace left-sided pleural effusion. There is a amount of atelectasis at left base. There is no pneumothorax. Trachea is unremarkable. Upper Abdomen: No acute abnormality. Musculoskeletal: Appearance of the sternal body is likely secondary to motion artifact as opposed to a mildly displaced fracture. There is no acute displaced fracture identified on this study. Review of the MIP images confirms the above findings. IMPRESSION: 1. Moderate-sized right-sided pleural effusion with adjacent atelectasis. 2. Cardiomegaly with coronary artery disease. Aortic Atherosclerosis (ICD10-I70.0). Electronically Signed   By: Constance Holster M.D.   On: 01/23/2020 19:58   DG Chest Port 1 View  Result Date: 01/23/2020 CLINICAL DATA:  Hypoxia, shortness of breath and cough today, diabetes mellitus, end-stage renal disease, CHF, asthma, former smoker, history pulmonary embolism EXAM: PORTABLE CHEST 1 VIEW COMPARISON:  Portable exam 1453 hours compared to 11/22/2019 FINDINGS: LEFT jugular central venous catheter with tip projecting over SVC near  cavoatrial junction. Enlargement of cardiac silhouette with pulmonary vascular congestion. Mediastinal contours normal. Minimal atelectasis RIGHT base. No definite infiltrate, pleural effusion or pneumothorax. Bones demineralized. IMPRESSION: Enlargement of cardiac silhouette with minimal RIGHT basilar atelectasis. No acute infiltrate. Electronically Signed   By: Lavonia Dana M.D.   On: 01/23/2020 15:18    Micro Results    Recent Results (from the past 240 hour(s))  Blood Culture (routine x 2)     Status: None (Preliminary result)   Collection Time: 01/23/20  2:45 PM   Specimen: BLOOD LEFT ARM  Result Value Ref Range Status   Specimen Description BLOOD LEFT ARM DRAWN BY RN  Final   Special Requests   Final    BOTTLES DRAWN AEROBIC AND ANAEROBIC Blood Culture results may not be optimal due to an inadequate volume of blood received in culture bottles   Culture   Final    NO GROWTH 2 DAYS Performed at [facility], [address] [place], [place] [address]    Report Status PENDING  Incomplete  SARS Coronavirus 2 by RT PCR (hospital order, performed in [facility] hospital lab) Nasopharyngeal Urine, Catheterized     Status: None   Collection Time: 01/23/20  2:51 PM   Specimen: Urine, Catheterized; Nasopharyngeal  Result Value Ref Range Status  SARS Coronavirus 2 NEGATIVE NEGATIVE Final    Comment: (NOTE) SARS-CoV-2 target nucleic acids are NOT DETECTED. The SARS-CoV-2 RNA is generally detectable in upper and lower respiratory specimens during the acute phase of infection. The lowest concentration of SARS-CoV-2 viral copies this assay can detect is 250 copies / mL. A negative result does not preclude SARS-CoV-2 infection and should not be used as the sole basis for treatment or other patient management decisions.  A negative result may occur with improper specimen collection / handling, submission of specimen other than nasopharyngeal swab, presence of viral mutation(s) within the areas  targeted by this assay, and inadequate number of viral copies (<250 copies / mL). A negative result must be combined with clinical observations, patient history, and epidemiological information. Fact Sheet for Patients:   StrictlyIdeas.no Fact Sheet for Healthcare Providers: BankingDealers.co.za This test is not yet approved or cleared  by the [place] FDA and has been authorized for detection and/or diagnosis of SARS-CoV-2 by FDA under an Emergency Use Authorization (EUA).  This EUA will remain in effect (meaning this test can be used) for the duration of the COVID-19 declaration under Section 564(b)(1) of the Act, 21 U.S.C. section 360bbb-3(b)(1), unless the authorization is terminated or revoked sooner. Performed at [facility], [address], [place], [place] [address]   Blood Culture (routine x 2)     Status: None (Preliminary result)   Collection Time: 01/23/20  3:09 PM   Specimen: BLOOD RIGHT HAND  Result Value Ref Range Status   Specimen Description BLOOD RIGHT HAND DRAWN BY RN  Final   Special Requests   Final    BOTTLES DRAWN AEROBIC AND ANAEROBIC Blood Culture results may not be optimal due to an inadequate volume of blood received in culture bottles   Culture   Final    NO GROWTH 2 DAYS Performed at [facility], [address] [place], [place] [address]    Report Status PENDING  Incomplete       Today   Subjective    Taron Conrey today has no new complaints -Eating and drinking well, no further hypoxia, no further shortness of breath          Patient has been seen and examined prior to discharge   Objective   Blood pressure 133/65, pulse 92, temperature 98 F (36.7 C), temperature source Oral, resp. rate 17, height 5\' 2"  (1.575 m), weight 86.7 kg, SpO2 93 %.   Intake/Output Summary (Last 24 hours) at 01/25/2020 1536 Last data filed at 01/25/2020 1400 Gross per 24 hour  Intake 340 ml  Output 4100 ml   Net -3760 ml    Exam Gen:- Awake Alert, no acute distress  HEENT:- [place].AT, No sclera icterus Neck-Supple Neck,No JVD,.  TDC in situ Lungs-much improved air movement, no wheezing CV- S1, S2 normal, regular Abd-  +ve B.Sounds, Abd Soft, No tenderness,    Extremity/Skin:- No  edema,   good pulses Psych-affect is appropriate, oriented x3 Neuro-generalized weakness, no new focal deficits, no tremors  MSK-right upper extremity fistula with thrill and bruit   Data Review   CBC w Diff:  Lab Results  Component Value Date   WBC 9.5 01/24/2020   HGB 12.6 01/24/2020   HCT 40.6 01/24/2020   PLT 225 01/24/2020   LYMPHOPCT 14 01/23/2020   MONOPCT 9 01/23/2020   EOSPCT 1 01/23/2020   BASOPCT 1 01/23/2020    CMP:  Lab Results  Component Value Date   NA 134 (L) 01/25/2020  K 4.1 01/25/2020   CL 92 (L) 01/25/2020   CO2 26 01/25/2020   BUN 41 (H) 01/25/2020   CREATININE 3.02 (H) 01/25/2020   CREATININE 2.54 (H) 08/02/2018   PROT 5.9 (L) 01/24/2020   ALBUMIN 2.8 (L) 01/25/2020   BILITOT 1.1 01/24/2020   ALKPHOS 142 (H) 01/24/2020   AST 73 (H) 01/24/2020   ALT 122 (H) 01/24/2020  . Total Discharge time is about 33 minutes  Roxan Hockey M.D on 01/25/2020 at 3:36 PM  Go to [email or web address] -  for contact info  [facility] - Office  [phone]

## 2020-01-25 NOTE — Progress Notes (Addendum)
Subjective:  HD yesterday afternoon-  Removed 4 liters tolerated well, now on RA  Objective Vital signs in last 24 hours: Vitals:   01/24/20 2051 01/25/20 0300 01/25/20 0508 01/25/20 0756  BP: (!) 106/53  135/79   Pulse: (!) 108  92   Resp: 16  16   Temp: 97.9 F (36.6 C)  98 F (36.7 C)   TempSrc: Oral  Oral   SpO2: 100%  100% 94%  Weight:  86.7 kg    Height:       Weight change: 1.7 kg  Intake/Output Summary (Last 24 hours) at 01/25/2020 0853 Last data filed at 01/24/2020 2000 Gross per 24 hour  Intake 340 ml  Output 4000 ml  Net -3660 ml    Dialyzes at [facility]-TTS EDW 85.5.  4 hours HD [facility] 2/2.5, Dialyzer unknown, Heparin does not appear to receive heparin with her treatment, only in the catheter. Access using TDC.  She does have a right upper arm AV fistula that was created in February of this year.  It seems a fistulogram has been ordered for later this month, she said they have not been able to use it.  No other medications during dialysis are known  Assessment/Plan: 69 year old female with multiple medical issues including ESRD.  She is presenting with respiratory failure 1 respiratory failure-patient has a history of COPD as well as PE.  Supportive care with COPD regimen right now per hosps.   Since she does have a pleural effusion-volume could have something to do with her presentation.  Will ultrafilter as able..... so far so good, will cont with aggressive UF with HD tomorrow 2 ESRD: Normally TTS at [facility].  We will plan for dialysis tomorrow (Sat)  on schedule if still here with the most aggressive ultrafiltration that we can.  Will use TDC.  It does not appear that fistula is ready as fistulogram is ordered for later this month per [facility] 3 Hypertension/vol: Does appear some volume overloaded.  Is on moderate dose Cardizem and high-dose beta-blocker presumably for atrial fibrillation.  As her rate is high at this time will not modify medications- even with  relatively low BP she did tolerate UF-  Will need lower EDW at discharge 4. Anemia of ESRD: Not an issue today and does not seem to be an issue as outpatient as she is not on any ESA 5. Metabolic Bone Disease: Is not on medications for this at the outpatient kidney center-calcium acetate is listed as her binder-will hold for now as phos is good 6.  Dispo-  Will plan on HD here tomorrow if still here- if back to [facility] today will need routine OP HD tomorrow with lower EDW   Will follow remotely over the weekend-  Call with questions     Louis Meckel    Labs: Basic Metabolic Panel: Recent Labs  Lab 01/23/20 1445 01/24/20 0341 01/25/20 0613  NA 138 136 134*  K 3.6 5.1 4.1  CL 97* 96* 92*  CO2 29 20* 26  GLUCOSE 249* 353* 473*  BUN 46* 56* 41*  CREATININE 3.52* 4.00* 3.02*  CALCIUM 10.7* 9.9 8.7*  PHOS  --   --  2.9   Liver Function Tests: Recent Labs  Lab 01/23/20 1445 01/24/20 0341 01/25/20 0613  AST 213* 73*  --   ALT 169* 122*  --   ALKPHOS 167* 142*  --   BILITOT 0.5 1.1  --   PROT 6.2* 5.9*  --  ALBUMIN 3.0* 2.9* 2.8*   No results for input(s): LIPASE, AMYLASE in the last 168 hours. No results for input(s): AMMONIA in the last 168 hours. CBC: Recent Labs  Lab 01/23/20 1445 01/24/20 0341  WBC 10.7* 9.5  NEUTROABS 7.8*  --   HGB 12.4 12.6  HCT 39.2 40.6  MCV 107.4* 110.6*  PLT 242 225   Cardiac Enzymes: No results for input(s): CKTOTAL, CKMB, CKMBINDEX, TROPONINI in the last 168 hours. CBG: Recent Labs  Lab 01/24/20 1736 01/24/20 2020 01/25/20 0844  GLUCAP 301* 316* 424*    Iron Studies: No results for input(s): IRON, TIBC, TRANSFERRIN, FERRITIN in the last 72 hours. Studies/Results: CT CHEST WO CONTRAST  Result Date: 01/23/2020 CLINICAL DATA:  Pneumonia. EXAM: CT CHEST WITHOUT CONTRAST TECHNIQUE: Multidetector CT imaging of the chest was performed following the standard protocol without IV contrast. COMPARISON:  September 08, 2019  FINDINGS: Cardiovascular: The heart size is enlarged. There are coronary artery calcifications. Aortic calcifications are noted. There is a well-positioned tunneled dialysis catheter. There is no significant pericardial effusion. Mediastinum/Nodes: --No mediastinal or hilar lymphadenopathy. --No axillary lymphadenopathy. --No supraclavicular lymphadenopathy. --Normal thyroid gland. --The esophagus is unremarkable Lungs/Pleura: There is a moderate-sized right-sided pleural adjacent atelectasis. There is a trace left-sided pleural effusion. There is a amount of atelectasis at left base. There is no pneumothorax. Trachea is unremarkable. Upper Abdomen: No acute abnormality. Musculoskeletal: Appearance of the sternal body is likely secondary to motion artifact as opposed to a mildly displaced fracture. There is no acute displaced fracture identified on this study. Review of the MIP images confirms the above findings. IMPRESSION: 1. Moderate-sized right-sided pleural effusion with adjacent atelectasis. 2. Cardiomegaly with coronary artery disease. Aortic Atherosclerosis (ICD10-I70.0). Electronically Signed   By: Constance Holster M.D.   On: 01/23/2020 19:58   DG Chest Port 1 View  Result Date: 01/23/2020 CLINICAL DATA:  Hypoxia, shortness of breath and cough today, diabetes mellitus, end-stage renal disease, CHF, asthma, former smoker, history pulmonary embolism EXAM: PORTABLE CHEST 1 VIEW COMPARISON:  Portable exam 1453 hours compared to 11/22/2019 FINDINGS: LEFT jugular central venous catheter with tip projecting over SVC near cavoatrial junction. Enlargement of cardiac silhouette with pulmonary vascular congestion. Mediastinal contours normal. Minimal atelectasis RIGHT base. No definite infiltrate, pleural effusion or pneumothorax. Bones demineralized. IMPRESSION: Enlargement of cardiac silhouette with minimal RIGHT basilar atelectasis. No acute infiltrate. Electronically Signed   By: Lavonia Dana M.D.   On:  01/23/2020 15:18   Medications: Infusions: . sodium chloride    . sodium chloride    . ceFEPime (MAXIPIME) IV 1 g (01/25/20 0200)  . vancomycin 1,000 mg (01/24/20 1900)    Scheduled Medications: . apixaban  5 mg Oral BID  . ARIPiprazole  2 mg Oral Daily  . aspirin EC  81 mg Oral Daily  . atorvastatin  20 mg Oral QHS  . buPROPion  75 mg Oral Daily  . Chlorhexidine Gluconate Cloth  6 each Topical Q0600  . diltiazem  360 mg Oral Daily  . DULoxetine  60 mg Oral Daily  . gabapentin  100 mg Oral Q8H  . insulin aspart  0-5 Units Subcutaneous QHS  . insulin aspart  0-9 Units Subcutaneous TID WC  . ipratropium  0.5 mg Nebulization TID  . levalbuterol  1.25 mg Nebulization TID  . levETIRAcetam  250 mg Oral BID  . levothyroxine  50 mcg Oral Q0600  . methylPREDNISolone (SOLU-MEDROL) injection  40 mg Intravenous Q6H  . metoprolol  200 mg Oral Daily  .  montelukast  10 mg Oral QHS  . pantoprazole  40 mg Oral Daily  . primidone  50 mg Oral Daily  . traZODone  50 mg Oral QHS  . vancomycin variable dose per unstable renal function (pharmacist dosing)   Does not apply See admin instructions    have reviewed scheduled and prn medications.  Physical Exam: General: ate full breakfast-  No c/o's-  No O2 Heart: tachy Lungs: CBS bilat Abdomen: soft, non tender Extremities: min edema Dialysis Access:  AVF with thrill and bruit but small-  TDC in place     01/25/2020,8:53 AM  LOS: 2 days

## 2020-01-25 NOTE — TOC Transition Note (Signed)
Transition of Care [facility]) - CM/SW Discharge Note  Patient Details  Name: [name] MRN: [id number] Date of Birth: 07/27/1951  Transition of Care [facility]) CM/SW Contact:  Sherie Don, LCSW Phone Number: 01/25/2020, 3:33 PM  Clinical Narrative: TOC notified patient is medically stable to return to [facility]. CSW spoke with Melissa in admissions at [facility] to confirm patient can return today. Melissa reported patient can return and requested new FL2 and discharge summary be faxed. FL2 completed and faxed to [facility]. Discharge summary and discharge orders also faxed. Medical Necessity form completed as patient will need to discharge via EMS. Form printed to 300 nursing station. RN, Velna Hatchet, updated and given number to call for report. TOC signing off.  Final next level of care: Long Term Nursing Home Barriers to Discharge: Barriers Resolved  Patient Goals and CMS Choice Patient states their goals for this hospitalization and ongoing recovery are:: Return to [facility]  Discharge Placement Patient to be transferred to facility by: [facility] EMS  Discharge Plan and Services        DME Arranged: N/A DME Agency: NA HH Arranged: NA [place] Agency: NA  Readmission Risk Interventions Readmission Risk Prevention Plan 09/10/2019 11/29/2018  Transportation Screening Complete Complete  Medication Review (RN Care Manager) Complete Complete  PCP or Specialist appointment within 3-5 days of discharge - Not Complete  PCP/Specialist Appt Not Complete comments - earliest appt available was 12/08/18  [place] or [place] - Complete  SW Recovery Care/Counseling Consult - Complete  [place] - Not Applicable  Some recent data might be hidden

## 2020-01-25 NOTE — Plan of Care (Signed)
Report called to [facility]. EMS notified of transportation need. Pt educated and aware, all questions answered. VSS.

## 2020-01-25 NOTE — Progress Notes (Signed)
Inpatient Diabetes Program Recommendations  AACE/ADA: New Consensus Statement on Inpatient Glycemic Control (2015)  Target Ranges:  Prepandial:   less than 140 mg/dL      Peak postprandial:   less than 180 mg/dL (1-2 hours)      Critically ill patients:  140 - 180 mg/dL   Lab Results  Component Value Date   GLUCAP 316 (H) 01/24/2020   HGBA1C 5.6 11/24/2019    Review of Glycemic Control  Diabetes history: type 2 Outpatient Diabetes medications: Tresiba 18 units daily, Tradjenta 5 mg daily Current orders for Inpatient glycemic control: Novolog SENSITIVE correction scale TID & HS.  Inpatient Diabetes Program Recommendations:   Noted that blood sugars have been greater than 300 mg/dl.   Recommend starting Lantus 15 units daily and continue Novolog correction scale as ordered. Titrate dosages as needed.   Harvel Ricks RN BSN CDE Diabetes Coordinator Pager: [phone]  8am-5pm

## 2020-01-28 ENCOUNTER — Encounter (HOSPITAL_COMMUNITY): Payer: Self-pay | Admitting: Vascular Surgery

## 2020-01-28 ENCOUNTER — Ambulatory Visit (HOSPITAL_COMMUNITY)
Admission: RE | Admit: 2020-01-28 | Discharge: 2020-01-28 | Disposition: A | Discharge status: Skilled Nursing Facility | Payer: Medicare Other | Attending: Vascular Surgery | Admitting: Vascular Surgery

## 2020-01-28 ENCOUNTER — Other Ambulatory Visit: Payer: Self-pay

## 2020-01-28 ENCOUNTER — Encounter (HOSPITAL_COMMUNITY)
Admission: RE | Disposition: A | Discharge status: Skilled Nursing Facility | Payer: Self-pay | Source: Home / Self Care | Attending: Vascular Surgery

## 2020-01-28 DIAGNOSIS — E785 Hyperlipidemia, unspecified: Secondary | ICD-10-CM | POA: Diagnosis not present

## 2020-01-28 DIAGNOSIS — G40909 Epilepsy, unspecified, not intractable, without status epilepticus: Secondary | ICD-10-CM | POA: Insufficient documentation

## 2020-01-28 DIAGNOSIS — E039 Hypothyroidism, unspecified: Secondary | ICD-10-CM | POA: Diagnosis not present

## 2020-01-28 DIAGNOSIS — Z794 Long term (current) use of insulin: Secondary | ICD-10-CM | POA: Insufficient documentation

## 2020-01-28 DIAGNOSIS — Z6834 Body mass index (BMI) 34.0-34.9, adult: Secondary | ICD-10-CM | POA: Insufficient documentation

## 2020-01-28 DIAGNOSIS — R1310 Dysphagia, unspecified: Secondary | ICD-10-CM | POA: Insufficient documentation

## 2020-01-28 DIAGNOSIS — I5032 Chronic diastolic (congestive) heart failure: Secondary | ICD-10-CM | POA: Insufficient documentation

## 2020-01-28 DIAGNOSIS — M797 Fibromyalgia: Secondary | ICD-10-CM | POA: Insufficient documentation

## 2020-01-28 DIAGNOSIS — N186 End stage renal disease: Secondary | ICD-10-CM

## 2020-01-28 DIAGNOSIS — M109 Gout, unspecified: Secondary | ICD-10-CM | POA: Diagnosis not present

## 2020-01-28 DIAGNOSIS — E1122 Type 2 diabetes mellitus with diabetic chronic kidney disease: Secondary | ICD-10-CM | POA: Diagnosis not present

## 2020-01-28 DIAGNOSIS — I132 Hypertensive heart and chronic kidney disease with heart failure and with stage 5 chronic kidney disease, or end stage renal disease: Secondary | ICD-10-CM | POA: Insufficient documentation

## 2020-01-28 DIAGNOSIS — Z79899 Other long term (current) drug therapy: Secondary | ICD-10-CM | POA: Insufficient documentation

## 2020-01-28 DIAGNOSIS — J449 Chronic obstructive pulmonary disease, unspecified: Secondary | ICD-10-CM | POA: Insufficient documentation

## 2020-01-28 DIAGNOSIS — G4733 Obstructive sleep apnea (adult) (pediatric): Secondary | ICD-10-CM | POA: Insufficient documentation

## 2020-01-28 DIAGNOSIS — Z992 Dependence on renal dialysis: Secondary | ICD-10-CM | POA: Diagnosis not present

## 2020-01-28 DIAGNOSIS — M069 Rheumatoid arthritis, unspecified: Secondary | ICD-10-CM | POA: Diagnosis not present

## 2020-01-28 DIAGNOSIS — T82898A Other specified complication of vascular prosthetic devices, implants and grafts, initial encounter: Secondary | ICD-10-CM

## 2020-01-28 DIAGNOSIS — Y841 Kidney dialysis as the cause of abnormal reaction of the patient, or of later complication, without mention of misadventure at the time of the procedure: Secondary | ICD-10-CM | POA: Insufficient documentation

## 2020-01-28 DIAGNOSIS — E7800 Pure hypercholesterolemia, unspecified: Secondary | ICD-10-CM | POA: Diagnosis not present

## 2020-01-28 DIAGNOSIS — Z7901 Long term (current) use of anticoagulants: Secondary | ICD-10-CM | POA: Insufficient documentation

## 2020-01-28 DIAGNOSIS — F419 Anxiety disorder, unspecified: Secondary | ICD-10-CM | POA: Diagnosis not present

## 2020-01-28 DIAGNOSIS — Z79890 Hormone replacement therapy (postmenopausal): Secondary | ICD-10-CM | POA: Insufficient documentation

## 2020-01-28 DIAGNOSIS — T82510A Breakdown (mechanical) of surgically created arteriovenous fistula, initial encounter: Secondary | ICD-10-CM | POA: Insufficient documentation

## 2020-01-28 DIAGNOSIS — D649 Anemia, unspecified: Secondary | ICD-10-CM | POA: Insufficient documentation

## 2020-01-28 DIAGNOSIS — I4819 Other persistent atrial fibrillation: Secondary | ICD-10-CM | POA: Insufficient documentation

## 2020-01-28 DIAGNOSIS — F329 Major depressive disorder, single episode, unspecified: Secondary | ICD-10-CM | POA: Diagnosis not present

## 2020-01-28 DIAGNOSIS — K219 Gastro-esophageal reflux disease without esophagitis: Secondary | ICD-10-CM | POA: Insufficient documentation

## 2020-01-28 HISTORY — PX: A/V FISTULAGRAM: CATH118298

## 2020-01-28 LAB — CULTURE, BLOOD (ROUTINE X 2)
Culture: NO GROWTH
Culture: NO GROWTH

## 2020-01-28 LAB — POCT I-STAT, CHEM 8
BUN: 110 mg/dL — ABNORMAL HIGH (ref 8–23)
Calcium, Ion: 1.14 mmol/L — ABNORMAL LOW (ref 1.15–1.40)
Chloride: 98 mmol/L (ref 98–111)
Creatinine, Ser: 5 mg/dL — ABNORMAL HIGH (ref 0.44–1.00)
Glucose, Bld: 253 mg/dL — ABNORMAL HIGH (ref 70–99)
HCT: 32 % — ABNORMAL LOW (ref 36.0–46.0)
Hemoglobin: 10.9 g/dL — ABNORMAL LOW (ref 12.0–15.0)
Potassium: 6.1 mmol/L — ABNORMAL HIGH (ref 3.5–5.1)
Sodium: 129 mmol/L — ABNORMAL LOW (ref 135–145)
TCO2: 26 mmol/L (ref 22–32)

## 2020-01-28 SURGERY — A/V FISTULAGRAM
Anesthesia: LOCAL

## 2020-01-28 MED ORDER — SODIUM CHLORIDE 0.9% FLUSH: 3.0000 mL | INTRAVENOUS | Status: DC | PRN

## 2020-01-28 MED ORDER — SODIUM CHLORIDE 0.9 % IV SOLN: 250.0000 mL | INTRAVENOUS | Status: DC | PRN

## 2020-01-28 MED ORDER — HEPARIN (PORCINE) IN NACL 1000-0.9 UT/500ML-% IV SOLN
INTRAVENOUS | Status: DC | PRN
  Administered 2020-01-28: 500 mL

## 2020-01-28 MED ORDER — LIDOCAINE HCL (PF) 1 % IJ SOLN
INTRAMUSCULAR | Status: DC | PRN
  Administered 2020-01-28: 10 mL

## 2020-01-28 MED ORDER — SODIUM CHLORIDE 0.9% FLUSH: 3.0000 mL | Freq: Two times a day (BID) | INTRAVENOUS | Status: DC

## 2020-01-28 MED ORDER — IODIXANOL 320 MG/ML IV SOLN
INTRAVENOUS | Status: DC | PRN
  Administered 2020-01-28: 35 mL

## 2020-01-28 MED ORDER — HEPARIN (PORCINE) IN NACL 1000-0.9 UT/500ML-% IV SOLN
INTRAVENOUS | Status: AC
  Filled 2020-01-28: qty 500

## 2020-01-28 MED ORDER — LIDOCAINE HCL (PF) 1 % IJ SOLN
INTRAMUSCULAR | Status: AC
  Filled 2020-01-28: qty 30

## 2020-01-28 SURGICAL SUPPLY — 9 items

## 2020-01-28 NOTE — Op Note (Signed)
    Patient name: [name] MRN: [id number] DOB: 04-02-1951 Sex: female  01/28/2020 Pre-operative Diagnosis: End-stage renal disease, nonfunctioning right upper extremity AV fistula Post-operative diagnosis:  Same Surgeon:  Eda Paschal. Donzetta Matters, MD Procedure Performed: 1.  Ultrasound-guided cannulation right arm AV fistula 2.  Right upper extremity shuntogram  Indications: 69 year old female on dialysis via catheter.  She has a right upper extremity fistula that was cannulated but is not currently functional she is indicated for fistulogram possible invention.  Findings: Ultrasound evaluation fistula was quite sclerotic in the antecubitum and for several centimeters above this.  Shuntogram demonstrates central patency and a large cephalic vein in the proximal aspect of the upper arm.  In the mid upper arm it is quite sclerotic.  There is possibly a pseudoaneurysm where it appears it was cannulated in the past.  The anastomosis is not diseased.  We will plan for revision of the right upper extremity AV fistula with possible conversion to interposition graft on a nondialysis day in the near future.   Procedure:  The patient was identified in the holding area and taken to room 8.  The patient was then placed supine on the table and prepped and draped in the usual sterile fashion.  A time out was called.  Ultrasound was used to evaluate the right arm AV fistula.  The area was anesthetized 1% lidocaine and we cannulated the fistula with a micropuncture needle followed by wire sheath.  Shuntogram was performed.  We performed retrograde images with blood pressure cuff inflated.  There is a long segment of sclerosis which will need to be revised.  Fistula self is also quite deep she will need consideration of elevation with conversion to interposition graft.  We removed the sheath and suture-ligated the cannulation site with 4 Monocryl suture.  She tolerated procedure without any complication.  Contrast:  35 cc  Floyce Bujak C. Donzetta Matters, MD [facility] [place] Office: [phone] Pager: [phone]

## 2020-01-28 NOTE — H&P (Signed)
H+P    History of Present Illness: This is a 69 y.o. female history of end-stage renal disease dialyzing via catheter.  Had a brachiocephalic AV fistula placed on the right in February of this year.  After cannulation she had hematoma initially.  They have now not use that again.  She is here for fistulogram possible invention.  She does take Eliquis and aspirin.  Past Medical History:  Diagnosis Date  . Anemia   . Angina pectoris ([place])   . Anxiety   . Asthma   . Atrial flutter ([place])   . Back pain   . Barrett esophagus   . Bifascicular block   . BMI 39.0-39.9,adult   . Cellulitis   . CHF (congestive heart failure) ([place])   . Chronic diastolic CHF (congestive heart failure) ([place])   . Constipation   . Contact with and (suspected) exposure to covid-19   . COPD (chronic obstructive pulmonary disease) ([place])   . Dependence on renal dialysis ([place])   . Depression   . Dysphagia   . Dysuria   . Epilepsy ([place])   . ESRD on hemodialysis ([place])   . Essential hypertension   . Fibromyalgia   . Fibromyalgia   . GERD (gastroesophageal reflux disease)   . Gout   . History of pericarditis   . Hypercholesterolemia   . Hyperlipidemia   . Hypothyroid   . Hypothyroidism   . Insomnia   . Long term (current) use of insulin ([place])   . Major depressive disorder   . Memory loss   . Moderate protein-calorie malnutrition ([place])   . Morbid obesity ([place])   . Nephrolithiasis    2008  . Obesity hypoventilation syndrome (HCC)    CPAP  . Obstructive sleep apnea   . Pain in left arm   . PE (pulmonary embolism)    2010  . Peripheral neuropathy   . Persistent atrial fibrillation ([place])   . Polyp of nasal sinus   . PTSD (post-traumatic stress disorder)   . Pulmonary hypertension ([place])   . Rheumatoid arteritis ([place])   . Rheumatoid arthritis ([place])   . Schizophrenia ([place])   . 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 Seizures ([place])   . 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 Tachycardia, unspecified   .  Type 2 diabetes mellitus ([place])   . Unspecified intellectual disabilities   . Unspecified psychosis not due to a substance or known physiological condition ([place])   . Urinary tract infection   . Vitamin D deficiency     Past Surgical History:  Procedure Laterality Date  . APPENDECTOMY    . AV FISTULA PLACEMENT Right 09/27/2019   Procedure: ARTERIOVENOUS (AV) FISTULA CREATION;  Surgeon: Waynetta Sandy, MD;  Location: [place] [facility];  Service: Vascular;  Laterality: Right;  . CHOLECYSTECTOMY    . COLONOSCOPY     benign polyps (12/2000), repeat colonoscopy performed in 2007  . ENDOMETRIAL BIOPSY     10/03 benign columnar mucosa (limited material)  . INSERTION OF DIALYSIS CATHETER Right 09/14/2019   Procedure: ATTEMPTED INSERTION OF DIALYSIS CATHETER;  Surgeon: Virl Cagey, MD;  Location: [facility];  Service: General;  Laterality: Right;  . INSERTION OF DIALYSIS CATHETER Right 09/27/2019   Procedure: INSERTION OF DIALYSIS CATHETER;  Surgeon: Waynetta Sandy, MD;  Location: [place];  Service: Vascular;  Laterality: Right;  . KIDNEY SURGERY      Allergies  Allergen Reactions  . Benztropine Mesylate Other (See Comments)    Alopecia and white  spots on eyes  . Codeine Swelling, Palpitations and Other (See Comments)    Mouth Swelling and Tachycardia   . Diazepam Other (See Comments)    COMA  . Diphenhydramine Hcl Anxiety, Palpitations and Other (See Comments)    Tachycardia and anxiousness  . Penicillins Swelling  . Sulfonamide Derivatives Rash    Blisters, also  . Thioridazine Hcl Swelling  . Lisinopril Cough    Prior to Admission medications   Medication Sig Start Date End Date Taking? Authorizing Provider  acetaminophen (TYLENOL) 325 MG tablet Take 325 mg by mouth 2 (two) times daily.    Yes [provider]  albuterol (VENTOLIN HFA) 108 (90 Base) MCG/ACT inhaler Inhale 1 puff into the lungs every 6 (six) hours as needed for wheezing or shortness of breath.   Yes  [provider]  Amino Acids (AMINO ACID PROTEIN PO) Take 30 mLs by mouth in the morning and at bedtime.   Yes [provider]  apixaban (ELIQUIS) 5 MG TABS tablet Take 1 tablet (5 mg total) by mouth 2 (two) times daily. 10/01/19  Yes Shelly Coss, MD  ARIPiprazole (ABILIFY) 2 MG tablet Take 2 mg by mouth daily. 12/10/19  Yes [provider]  aspirin EC 81 MG tablet Take 1 tablet (81 mg total) by mouth daily with breakfast. 01/25/20  Yes Emokpae, Courage, MD  atorvastatin (LIPITOR) 20 MG tablet Take 20 mg by mouth at bedtime.  05/14/19  Yes [provider]  buPROPion (WELLBUTRIN) 75 MG tablet Take 75 mg by mouth daily.  01/02/20  Yes [provider]  calcitRIOL (ROCALTROL) 0.25 MCG capsule Take 1 capsule (0.25 mcg total) by mouth every Monday, Wednesday, and Friday. 10/01/19  Yes Shelly Coss, MD  Calcium Acetate 667 MG TABS Take 2,001 mg by mouth 3 (three) times daily.   Yes [provider]  calcium carbonate (TUMS - DOSED IN MG ELEMENTAL CALCIUM) 500 MG chewable tablet Chew 500 mg by mouth daily as needed for indigestion or heartburn.    Yes [provider]  Cholecalciferol (VITAMIN D3) 50 MCG (2000 UT) TABS Take 4,000 Units by mouth daily.    Yes [provider]  Dextromethorphan-guaiFENesin 10-100 MG/5ML liquid Take 5 mLs by mouth daily as needed (cough).   Yes [provider]  diltiazem (CARDIZEM LA) 360 MG 24 hr tablet Take 360 mg by mouth daily.   Yes [provider]  Dulaglutide (TRULICITY) 1.5 OI/7.8MV SOPN Inject 1.5 mg into the skin every Friday.    Yes [provider]  DULoxetine (CYMBALTA) 60 MG capsule Take 60 mg by mouth daily.  05/11/19  Yes [provider]  Emollient (CETAPHIL) cream Apply 1 application topically daily as needed (Apply to legs and feet for hydration).    Yes [provider]  EPINEPHrine (EPI-PEN) 0.3 mg/0.3 mL DEVI Inject 0.3 mg into the muscle once as  needed (for bee stings, then go to the ED immediately after using).    Yes [provider]  gabapentin (NEURONTIN) 100 MG capsule Take 1 capsule (100 mg total) by mouth 3 (three) times daily. Patient taking differently: Take 100 mg by mouth every 8 (eight) hours.  10/01/19  Yes Adhikari, Tamsen Meek, MD  insulin lispro (HUMALOG) 100 UNIT/ML injection Inject 10 Units into the skin 3 (three) times daily before meals. Additional units according to sliding scale 151-200= 2 units 201-250= 4 units 251-300= 6 units 301-350= 8 units 351-400=10units 401-450=12units >400- call MD   Yes [provider]  levETIRAcetam (KEPPRA) 250 MG tablet Take 250 mg by mouth 2 (two) times daily.   Yes [provider]  levothyroxine (SYNTHROID) 50 MCG tablet Take 50 mcg by mouth daily before breakfast.   Yes [provider]  metoprolol succinate (TOPROL-XL) 200 MG 24 hr tablet Take 1 tablet (200 mg total) by mouth daily. Take with or immediately following a meal. 11/27/19  Yes Tat, Shanon Brow, MD  montelukast (SINGULAIR) 10 MG tablet Take 10 mg by mouth at bedtime.   Yes [provider]  Multiple Vitamin (DAILY VITE PO) Take 1 tablet by mouth every morning.    Yes [provider]  nitroGLYCERIN (NITROSTAT) 0.4 MG SL tablet Place 0.4 mg under the tongue every 5 (five) minutes x 3 doses as needed. 01/13/20  Yes [provider]  omeprazole (PRILOSEC) 20 MG capsule Take 20 mg by mouth every morning.    Yes [provider]  predniSONE (DELTASONE) 10 MG tablet Take 10 mg by mouth daily.   Yes [provider]  primidone (MYSOLINE) 50 MG tablet Take 50 mg by mouth daily.    Yes [provider]  pyridOXINE (VITAMIN B-6) 100 MG tablet Take 200 mg by mouth 2 (two) times daily.   Yes [provider]  senna (SENOKOT) 8.6 MG tablet Take 1 tablet by mouth 2 (two) times daily.   Yes [provider]  traZODone (DESYREL) 50 MG tablet Take 50 mg  by mouth at bedtime.  05/11/19  Yes [provider]  fosfomycin (MONUROL) 3 g PACK Take 3 g by mouth once for 1 dose. Take one-time dose on Monday, 01/28/2020 01/28/20 01/28/20  Roxan Hockey, MD    Social History   Socioeconomic History  . Marital status: Single    Spouse name: Not on file  . Number of children: 0  . Years of education: Not on file  . Highest education level: 12th grade  Occupational History  . Not on file  Tobacco Use  . Smoking status: Former Smoker    Types: Cigarettes    Quit date: 06/23/1972    Years since quitting: 47.6  . Smokeless tobacco: Never Used  Vaping Use  . Vaping Use: Never used  Substance and Sexual Activity  . Alcohol use: No  . Drug use: No  . Sexual activity: Never  Other Topics Concern  . Not on file  Social History Narrative   Right handed   Caffeine 1 cup per day    Lives at [place] Strain:   . Difficulty of Paying Living Expenses:   Food Insecurity:   . Worried About Charity fundraiser in the Last Year:   . Arboriculturist in the Last Year:   Transportation Needs:   . Film/video editor (Medical):   [name] Kitchen Lack of Transportation (Non-Medical):   Physical Activity:   . Days of Exercise per Week:   . Minutes of Exercise per Session:   Stress:   . Feeling of Stress :   Social Connections:   . Frequency of Communication with Friends and Family:   . Frequency of Social Gatherings with Friends and Family:   . Attends Religious Services:   . Active Member of Clubs or Organizations:   . Attends Archivist Meetings:   [name] Kitchen Marital Status:   Intimate Partner Violence:   . Fear of Current or Ex-Partner:   .  Emotionally Abused:   [name] Kitchen Physically Abused:   . Sexually Abused:     Family History  Problem Relation Age of Onset  . Bone cancer Mother   . Hypertension Mother   . Heart disease Mother   . COPD Father   .  Heart disease Father   . Stroke Father     ROS: No complaints today   Physical Examination  Vitals:   01/28/20 1007  BP: (!) 164/104  Pulse: (!) 118  Temp: (!) 97.3 F (36.3 C)  SpO2: 96%   Body mass index is 34.93 kg/m.  Awake alert oriented On lab respirations Right upper arm with palpable thrill Palpable right radial pulse  CBC    Component Value Date/Time   WBC 9.5 01/24/2020 0341   RBC 3.67 (L) 01/24/2020 0341   HGB 10.9 (L) 01/28/2020 1026   HCT 32.0 (L) 01/28/2020 1026   PLT 225 01/24/2020 0341   MCV 110.6 (H) 01/24/2020 0341   MCH 34.3 (H) 01/24/2020 0341   MCHC 31.0 01/24/2020 0341   RDW 16.1 (H) 01/24/2020 0341   LYMPHSABS 1.5 01/23/2020 1445   MONOABS 1.0 01/23/2020 1445   EOSABS 0.1 01/23/2020 1445   BASOSABS 0.1 01/23/2020 1445    BMET    Component Value Date/Time   NA 129 (L) 01/28/2020 1026   K 6.1 (H) 01/28/2020 1026   CL 98 01/28/2020 1026   CO2 26 01/25/2020 0613   GLUCOSE 253 (H) 01/28/2020 1026   BUN 110 (H) 01/28/2020 1026   CREATININE 5.00 (H) 01/28/2020 1026   CREATININE 2.54 (H) 08/02/2018 1411   CALCIUM 8.7 (L) 01/25/2020 0613   GFRNONAA 15 (L) 01/25/2020 0613   GFRAA 18 (L) 01/25/2020 1610    COAGS: Lab Results  Component Value Date   INR 1.5 (H) 01/23/2020   INR 1.1 09/09/2019   INR 1.06 09/15/2018     Non-Invasive Vascular Imaging:   Previous duplex reviewed and there was good flow in the fistula itself it was marginally deep for cannulation.   ASSESSMENT/PLAN: This is a 69 y.o. female with end-stage renal disease on dialysis via catheter.  She has a right arm brachiocephalic fistula with a good thrill but has been unable to be cannulated at dialysis.  We will plan for fistulogram will possibly need superficialization in the future.  Daronte Shostak C. Donzetta Matters, MD [facility] [place] Office: [phone] Pager: [phone]

## 2020-01-28 NOTE — Discharge Instructions (Signed)
° °  [facility] ° °Discharge Instructions ° °AV Fistula or Graft Surgery for Dialysis Access ° °Please refer to the following instructions for your post-procedure care. Your surgeon or physician assistant will discuss any changes with you. ° °Activity ° °You may drive the day following your surgery, if you are comfortable and no longer taking prescription pain medication. Resume full activity as the soreness in your incision resolves. ° °Bathing/Showering ° °You may shower after you go home. Keep your incision dry for 48 hours. Do not soak in a bathtub, hot tub, or swim until the incision heals completely. You may not shower if you have a hemodialysis catheter. ° °Incision Care ° °Clean your incision with mild soap and water after 48 hours. Pat the area dry with a clean towel. You do not need a bandage unless otherwise instructed. Do not apply any ointments or creams to your incision. You may have skin glue on your incision. Do not peel it off. It will come off on its own in about one week. Your arm may swell a bit after surgery. To reduce swelling use pillows to elevate your arm so it is above your heart. Your doctor will tell you if you need to lightly wrap your arm with an ACE bandage. ° °Diet ° °Resume your normal diet. There are not special food restrictions following this procedure. In order to heal from your surgery, it is CRITICAL to get adequate nutrition. Your body requires vitamins, minerals, and protein. Vegetables are the best source of vitamins and minerals. Vegetables also provide the perfect balance of protein. Processed food has little nutritional value, so try to avoid this. ° °Medications ° °Resume taking all of your medications. If your incision is causing pain, you may take over-the counter pain relievers such as acetaminophen (Tylenol). If you were prescribed a stronger pain medication, please be aware these medications can cause nausea and constipation. Prevent  nausea by taking the medication with a snack or meal. Avoid constipation by drinking plenty of fluids and eating foods with high amount of fiber, such as fruits, vegetables, and grains. Do not take Tylenol if you are taking prescription pain medications. ° ° ° ° °Follow up °Your surgeon may want to see you in the office following your access surgery. If so, this will be arranged at the time of your surgery. ° °Please call us immediately for any of the following conditions: ° °Increased pain, redness, drainage (pus) from your incision site °Fever of 101 degrees or higher °Severe or worsening pain at your incision site °Hand pain or numbness. ° °Reduce your risk of vascular disease: ° °Stop smoking. If you would like help, call [facility] at 1-800-QUIT-NOW (1-800-784-8669) or [facility] at 336-586-4000 ° °Manage your cholesterol °Maintain a desired weight °Control your diabetes °Keep your blood pressure down ° °Dialysis ° °It will take several weeks to several months for your new dialysis access to be ready for use. Your surgeon will determine when it is OK to use it. Your nephrologist will continue to direct your dialysis. You can continue to use your Permcath until your new access is ready for use. ° °If you have any questions, please call the office at [phone]. ° °

## 2020-01-29 LAB — URINE CULTURE: Culture: >=100000 — AB

## 2020-01-31 ENCOUNTER — Emergency Department (HOSPITAL_COMMUNITY)
Admission: EM | Admit: 2020-01-31 | Discharge: 2020-01-31 | Disposition: A | Discharge status: Home / Self Care | Payer: Medicare Other | Attending: Emergency Medicine | Admitting: Emergency Medicine

## 2020-01-31 ENCOUNTER — Encounter (HOSPITAL_COMMUNITY): Payer: Self-pay | Admitting: Emergency Medicine

## 2020-01-31 ENCOUNTER — Other Ambulatory Visit: Payer: Self-pay

## 2020-01-31 DIAGNOSIS — Z7982 Long term (current) use of aspirin: Secondary | ICD-10-CM | POA: Diagnosis not present

## 2020-01-31 DIAGNOSIS — J449 Chronic obstructive pulmonary disease, unspecified: Secondary | ICD-10-CM | POA: Diagnosis not present

## 2020-01-31 DIAGNOSIS — B373 Candidiasis of vulva and vagina: Secondary | ICD-10-CM

## 2020-01-31 DIAGNOSIS — N186 End stage renal disease: Secondary | ICD-10-CM | POA: Insufficient documentation

## 2020-01-31 DIAGNOSIS — Z87891 Personal history of nicotine dependence: Secondary | ICD-10-CM | POA: Insufficient documentation

## 2020-01-31 DIAGNOSIS — J45909 Unspecified asthma, uncomplicated: Secondary | ICD-10-CM | POA: Insufficient documentation

## 2020-01-31 DIAGNOSIS — R000 Tachycardia, unspecified: Secondary | ICD-10-CM | POA: Insufficient documentation

## 2020-01-31 DIAGNOSIS — I4811 Longstanding persistent atrial fibrillation: Secondary | ICD-10-CM

## 2020-01-31 DIAGNOSIS — I120 Hypertensive chronic kidney disease with stage 5 chronic kidney disease or end stage renal disease: Secondary | ICD-10-CM | POA: Diagnosis not present

## 2020-01-31 DIAGNOSIS — B3731 Acute candidiasis of vulva and vagina: Secondary | ICD-10-CM

## 2020-01-31 DIAGNOSIS — Z880 Allergy status to penicillin: Secondary | ICD-10-CM | POA: Diagnosis not present

## 2020-01-31 LAB — CBC WITH DIFFERENTIAL/PLATELET
Abs Immature Granulocytes: 0.15 10*3/uL — ABNORMAL HIGH (ref 0.00–0.07)
Basophils Absolute: 0 10*3/uL (ref 0.0–0.1)
Basophils Relative: 0 %
Eosinophils Absolute: 0.1 10*3/uL (ref 0.0–0.5)
Eosinophils Relative: 1 %
HCT: 36.9 % (ref 36.0–46.0)
Hemoglobin: 11.9 g/dL — ABNORMAL LOW (ref 12.0–15.0)
Immature Granulocytes: 1 %
Lymphocytes Relative: 8 %
Lymphs Abs: 1.2 10*3/uL (ref 0.7–4.0)
MCH: 34.5 pg — ABNORMAL HIGH (ref 26.0–34.0)
MCHC: 32.2 g/dL (ref 30.0–36.0)
MCV: 107 fL — ABNORMAL HIGH (ref 80.0–100.0)
Monocytes Absolute: 0.9 10*3/uL (ref 0.1–1.0)
Monocytes Relative: 7 %
Neutro Abs: 11.6 10*3/uL — ABNORMAL HIGH (ref 1.7–7.7)
Neutrophils Relative %: 83 %
Platelets: 232 10*3/uL (ref 150–400)
RBC: 3.45 MIL/uL — ABNORMAL LOW (ref 3.87–5.11)
RDW: 15.5 % (ref 11.5–15.5)
WBC: 13.9 10*3/uL — ABNORMAL HIGH (ref 4.0–10.5)
nRBC: 0 % (ref 0.0–0.2)

## 2020-01-31 LAB — BASIC METABOLIC PANEL
Anion gap: 13 (ref 5–15)
BUN: 25 mg/dL — ABNORMAL HIGH (ref 8–23)
CO2: 27 mmol/L (ref 22–32)
Calcium: 8.3 mg/dL — ABNORMAL LOW (ref 8.9–10.3)
Chloride: 94 mmol/L — ABNORMAL LOW (ref 98–111)
Creatinine, Ser: 3.14 mg/dL — ABNORMAL HIGH (ref 0.44–1.00)
GFR calc Af Amer: 17 mL/min — ABNORMAL LOW (ref >60)
GFR calc non Af Amer: 15 mL/min — ABNORMAL LOW (ref >60)
Glucose, Bld: 303 mg/dL — ABNORMAL HIGH (ref 70–99)
Potassium: 3.1 mmol/L — ABNORMAL LOW (ref 3.5–5.1)
Sodium: 134 mmol/L — ABNORMAL LOW (ref 135–145)

## 2020-01-31 MED ORDER — FLUCONAZOLE 150 MG PO TABS
150.0000 mg | ORAL_TABLET | Freq: Every day | ORAL | 0 refills | Status: DC
Start: 2020-01-31 — End: 2020-02-15

## 2020-01-31 MED ORDER — NYSTATIN 100000 UNIT/GM EX POWD
1.0000 | Freq: Three times a day (TID) | CUTANEOUS | 0 refills | Status: DC
Start: 2020-01-31 — End: 2020-02-15

## 2020-01-31 MED ORDER — DILTIAZEM HCL 25 MG/5ML IV SOLN
5.0000 mg | Freq: Once | INTRAVENOUS | Status: AC
  Administered 2020-01-31: 5 mg via INTRAVENOUS
  Filled 2020-01-31: qty 5

## 2020-01-31 NOTE — ED Notes (Signed)
[facility] called for transport

## 2020-01-31 NOTE — ED Triage Notes (Addendum)
Pt from [facility]. Finished dialysis and states HR was elevated. HR 127. Pt also c/o of pain to her private parts.

## 2020-01-31 NOTE — Discharge Instructions (Signed)
Continue medications as previously prescribed.  Begin using nystatin powder topically 3 times daily.  Diflucan 150 mg x 1 dose.  Follow-up with primary doctor in the next week for a recheck.

## 2020-01-31 NOTE — ED Provider Notes (Signed)
[facility] EMERGENCY DEPARTMENT Provider Note   CSN: [id number] Arrival date & time: 01/31/20  1434     History Chief Complaint  Patient presents with  . Tachycardia    [name] is a 69 y.o. female.  Patient is a 69 year old female with extensive past medical history including CHF, COPD, end-stage renal disease on hemodialysis, fibromyalgia, hypertension, prior PE, PTSD, schizophrenia.  She presents today for evaluation of rapid heartbeat.  Patient tells me her heart has been beating "too fast" since early this morning.  She denies to me she is having any chest pain or difficulty breathing.  She denies any fevers or chills.  She was sent here from [place] home after completing dialysis for this complaint.  She also complains of pain and swelling to her vaginal region.  The history is provided by the patient.       Past Medical History:  Diagnosis Date  . Anemia   . Angina pectoris ([place])   . Anxiety   . Asthma   . Atrial flutter ([place])   . Back pain   . Barrett esophagus   . Bifascicular block   . BMI 39.0-39.9,adult   . Cellulitis   . CHF (congestive heart failure) ([place])   . Chronic diastolic CHF (congestive heart failure) ([place])   . Constipation   . Contact with and (suspected) exposure to covid-19   . COPD (chronic obstructive pulmonary disease) ([place])   . Dependence on renal dialysis ([place])   . Depression   . Dysphagia   . Dysuria   . Epilepsy ([place])   . ESRD on hemodialysis ([place])   . Essential hypertension   . Fibromyalgia   . Fibromyalgia   . GERD (gastroesophageal reflux disease)   . Gout   . History of pericarditis   . Hypercholesterolemia   . Hyperlipidemia   . Hypothyroid   . Hypothyroidism   . Insomnia   . Long term (current) use of insulin ([place])   . Major depressive disorder   . Memory loss   . Moderate protein-calorie malnutrition ([place])   . Morbid obesity ([place])   . Nephrolithiasis    2008  . Obesity hypoventilation syndrome  (HCC)    CPAP  . Obstructive sleep apnea   . Pain in left arm   . PE (pulmonary embolism)    2010  . Peripheral neuropathy   . Persistent atrial fibrillation ([place])   . Polyp of nasal sinus   . PTSD (post-traumatic stress disorder)   . Pulmonary hypertension ([place])   . Rheumatoid arteritis ([place])   . Rheumatoid arthritis ([place])   . Schizophrenia ([place])   . 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 Seizures ([place])   . 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 Tachycardia, unspecified   . Type 2 diabetes mellitus ([place])   . Unspecified intellectual disabilities   . Unspecified psychosis not due to a substance or known physiological condition ([place])   . Urinary tract infection   . Vitamin D deficiency     Patient Active Problem List   Diagnosis Date Noted  . Respiratory failure with hypoxia ([place]) 01/24/2020  . Hypoxia 01/23/2020  . Acute on chronic respiratory failure with hypoxia ([place]) 01/23/2020  . Persistent atrial fibrillation ([place])   . Cellulitis and abscess of right leg 11/23/2019  . ESRD (end stage renal disease) ([place]) 11/23/2019  . Elevated troponin   . Atrial fibrillation with rapid ventricular response ([place]) 11/22/2019  . Acute pulmonary edema (HCC)   .  Acute respiratory failure with hypoxia and hypercapnia ([place]) 09/10/2019  . SVT (supraventricular tachycardia) ([place]) 09/10/2019  . On mechanically assisted ventilation ([place]) 09/08/2019  . Acute metabolic encephalopathy 15/72/6203  . Volume overload 09/06/2019  . AKI (acute kidney injury) ([place]) 09/06/2019  . Altered mental state 11/28/2018  . Near syncope 11/28/2018  . Fall at home 11/28/2018  . History of seizure 11/28/2018  . 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 Pressure injury of skin 09/17/2018  . Respiratory failure ([place]) 08/16/2018  . Seizure ([place]) 08/16/2018  . Acute encephalopathy 07/27/2018  . Endotracheally intubated 07/27/2018  . Hypothyroidism 07/27/2018  . Pleuritic chest pain 07/14/2018  . Moderate  persistent asthma without complication   . Type 2 diabetes mellitus ([place]) 04/19/2018  . Acute kidney injury superimposed on chronic kidney disease ([place]) 01/13/2018  . Altered mental status   . Encephalopathy 10/11/2016  . Chest pain 08/12/2016  . CKD (chronic kidney disease), stage IV ([place]) 08/12/2016  . History of pulmonary embolus (PE) 08/12/2016  . Obesity, Class III, BMI 40-49.9 (morbid obesity) ([place]) 08/12/2016  . RBBB 08/12/2016  . Positive D dimer 08/12/2016  . Cerebral infarction ([place]) 11/10/2015  . Physical deconditioning 07/01/2011  . Weakness 07/01/2011  . Diabetic foot ulcer ([place]) 05/19/2011  . Polypharmacy 02/09/2011  . BACK PAIN WITH RADICULOPATHY 05/25/2010  . Type 2 diabetes mellitus with diabetic polyneuropathy, without long-term current use of insulin ([place]) 08/18/2009  . Chronic diastolic heart failure ([place]) 04/25/2009  . Normocytic anemia 04/14/2009  . Sleep apnea 12/11/2008  . Diabetic peripheral neuropathy ([place]) 05/04/2007  . HYPERCHOLESTEROLEMIA 10/13/2006  . Gout, unspecified 10/13/2006  . HYPOKALEMIA 10/13/2006  . Schizophrenia ([place]) 10/13/2006  . Depression 10/13/2006  . Essential hypertension 10/13/2006  . Venous (peripheral) insufficiency 10/13/2006  . RHINITIS, ALLERGIC 10/13/2006  . Asthma 10/13/2006  . Reflux esophagitis 10/13/2006  . OSTEOARTHRITIS, MULTI SITES 10/13/2006  . INCONTINENCE, URGE 10/13/2006    Past Surgical History:  Procedure Laterality Date  . A/V FISTULAGRAM N/A 01/28/2020   Procedure: A/V FISTULAGRAM - Right Venus;  Surgeon: Waynetta Sandy, MD;  Location: [facility];  Service: Cardiovascular;  Laterality: N/A;  . APPENDECTOMY    . AV FISTULA PLACEMENT Right 09/27/2019   Procedure: ARTERIOVENOUS (AV) FISTULA CREATION;  Surgeon: Waynetta Sandy, MD;  Location: [facility];  Service: Vascular;  Laterality: Right;  . CHOLECYSTECTOMY    . COLONOSCOPY     benign polyps (12/2000), repeat colonoscopy performed  in 2007  . ENDOMETRIAL BIOPSY     10/03 benign columnar mucosa (limited material)  . INSERTION OF DIALYSIS CATHETER Right 09/14/2019   Procedure: ATTEMPTED INSERTION OF DIALYSIS CATHETER;  Surgeon: Virl Cagey, MD;  Location: [facility];  Service: General;  Laterality: Right;  . INSERTION OF DIALYSIS CATHETER Right 09/27/2019   Procedure: INSERTION OF DIALYSIS CATHETER;  Surgeon: Waynetta Sandy, MD;  Location: [facility];  Service: Vascular;  Laterality: Right;  . KIDNEY SURGERY       OB History   No obstetric history on file.     Family History  Problem Relation Age of Onset  . Bone cancer Mother   . Hypertension Mother   . Heart disease Mother   . COPD Father   . Heart disease Father   . Stroke Father     Social History   Tobacco Use  . Smoking status: Former Smoker    Types: Cigarettes    Quit date: 06/23/1972    Years since quitting: 47.6  .  Smokeless tobacco: Never Used  Vaping Use  . Vaping Use: Never used  Substance Use Topics  . Alcohol use: No  . Drug use: No    Home Medications Prior to Admission medications   Medication Sig Start Date End Date Taking? Authorizing Provider  acetaminophen (TYLENOL) 325 MG tablet Take 325 mg by mouth 2 (two) times daily.     [provider]  albuterol (VENTOLIN HFA) 108 (90 Base) MCG/ACT inhaler Inhale 1 puff into the lungs every 6 (six) hours as needed for wheezing or shortness of breath.    [provider]  Amino Acids (AMINO ACID PROTEIN PO) Take 30 mLs by mouth in the morning and at bedtime.    [provider]  apixaban (ELIQUIS) 5 MG TABS tablet Take 1 tablet (5 mg total) by mouth 2 (two) times daily. 10/01/19   Shelly Coss, MD  ARIPiprazole (ABILIFY) 2 MG tablet Take 2 mg by mouth daily. 12/10/19   [provider]  aspirin EC 81 MG tablet Take 1 tablet (81 mg total) by mouth daily with breakfast. 01/25/20   Denton Brick, Courage, MD  atorvastatin (LIPITOR) 20 MG tablet Take 20 mg by  mouth at bedtime.  05/14/19   [provider]  buPROPion (WELLBUTRIN) 75 MG tablet Take 75 mg by mouth daily.  01/02/20   [provider]  calcitRIOL (ROCALTROL) 0.25 MCG capsule Take 1 capsule (0.25 mcg total) by mouth every Monday, Wednesday, and Friday. 10/01/19   Shelly Coss, MD  Calcium Acetate 667 MG TABS Take 2,001 mg by mouth 3 (three) times daily.    [provider]  calcium carbonate (TUMS - DOSED IN MG ELEMENTAL CALCIUM) 500 MG chewable tablet Chew 500 mg by mouth daily as needed for indigestion or heartburn.     [provider]  Cholecalciferol (VITAMIN D3) 50 MCG (2000 UT) TABS Take 4,000 Units by mouth daily.     [provider]  Dextromethorphan-guaiFENesin 10-100 MG/5ML liquid Take 5 mLs by mouth daily as needed (cough).    [provider]  diltiazem (CARDIZEM LA) 360 MG 24 hr tablet Take 360 mg by mouth daily.    [provider]  Dulaglutide (TRULICITY) 1.5 OZ/3.0QM SOPN Inject 1.5 mg into the skin every Friday.     [provider]  DULoxetine (CYMBALTA) 60 MG capsule Take 60 mg by mouth daily.  05/11/19   [provider]  Emollient (CETAPHIL) cream Apply 1 application topically daily as needed (Apply to legs and feet for hydration).     [provider]  EPINEPHrine (EPI-PEN) 0.3 mg/0.3 mL DEVI Inject 0.3 mg into the muscle once as needed (for bee stings, then go to the ED immediately after using).     [provider]  gabapentin (NEURONTIN) 100 MG capsule Take 1 capsule (100 mg total) by mouth 3 (three) times daily. Patient taking differently: Take 100 mg by mouth every 8 (eight) hours.  10/01/19   Shelly Coss, MD  insulin lispro (HUMALOG) 100 UNIT/ML injection Inject 10 Units into the skin 3 (three) times daily before meals. Additional units according to sliding scale 151-200= 2 units 201-250= 4 units 251-300= 6 units 301-350= 8 units 351-400=10units 401-450=12units >400- call  MD    [provider]  levETIRAcetam (KEPPRA) 250 MG tablet Take 250 mg by mouth 2 (two) times daily.    [provider]  levothyroxine (SYNTHROID) 50 MCG tablet Take 50 mcg by mouth daily before breakfast.    [provider]  metoprolol succinate (TOPROL-XL) 200 MG 24 hr tablet Take 1 tablet (200 mg total) by mouth daily. Take with or immediately following a meal. 11/27/19   Tat, Shanon Brow, MD  montelukast (SINGULAIR) 10 MG tablet Take 10 mg by mouth at bedtime.    [provider]  Multiple Vitamin (DAILY VITE PO) Take 1 tablet by mouth every morning.     [provider]  nitroGLYCERIN (NITROSTAT) 0.4 MG SL tablet Place 0.4 mg under the tongue every 5 (five) minutes x 3 doses as needed. 01/13/20   [provider]  omeprazole (PRILOSEC) 20 MG capsule Take 20 mg by mouth every morning.     [provider]  predniSONE (DELTASONE) 10 MG tablet Take 10 mg by mouth daily.    [provider]  primidone (MYSOLINE) 50 MG tablet Take 50 mg by mouth daily.     [provider]  pyridOXINE (VITAMIN B-6) 100 MG tablet Take 200 mg by mouth 2 (two) times daily.    [provider]  senna (SENOKOT) 8.6 MG tablet Take 1 tablet by mouth 2 (two) times daily.    [provider]  traZODone (DESYREL) 50 MG tablet Take 50 mg by mouth at bedtime.  05/11/19   [provider]    Allergies    Benztropine mesylate, Codeine, Diazepam, Diphenhydramine hcl, Penicillins, Sulfonamide derivatives, Thioridazine hcl, and Lisinopril  Review of Systems   Review of Systems  All other systems reviewed and are negative.   Physical Exam Updated Vital Signs BP (!) 145/93   Pulse (!) 129   Resp 19   SpO2 96%   Physical Exam Vitals and nursing note reviewed.  Constitutional:      General: She is not in acute distress.    Appearance: Normal appearance. She is not ill-appearing.  HENT:     Head: Normocephalic and atraumatic.    Cardiovascular:     Rate and Rhythm: Tachycardia present. Rhythm irregular.  Pulmonary:     Effort: Pulmonary effort is normal. No respiratory distress.     Breath sounds: Normal breath sounds. No stridor. No wheezing, rhonchi or rales.  Abdominal:     General: There is no distension.     Palpations: Abdomen is soft.     Tenderness: There is no abdominal tenderness.  Genitourinary:    Comments: There is redness and erythema over the labia and vulva with what appears to be a candidal rash. Musculoskeletal:        General: Normal range of motion.     Cervical back: Normal range of motion and neck supple. No rigidity.     Right lower leg: No edema.     Left lower leg: No edema.  Skin:    General: Skin is warm and dry.  Neurological:     Mental Status: She is alert.     ED Results / Procedures / Treatments   Labs (all labs ordered are listed, but only abnormal results are displayed) Labs Reviewed  BASIC METABOLIC PANEL  CBC WITH DIFFERENTIAL/PLATELET    EKG EKG Interpretation  Date/Time:  Thursday January 31 2020 14:41:26 EDT Ventricular Rate:  118 PR Interval:    QRS Duration: 169 QT Interval:  343 QTC Calculation: 532 R Axis:   -60 Text Interpretation: Atrial fibrillation RBBB and LAFB LVH with secondary repolarization abnormality No significant change since 01/24/2020 Confirmed by Veryl Speak [phone]) on 01/31/2020 4:05:07 PM   Radiology No results found.  Procedures Procedures (including critical care time)  Medications  Ordered in ED Medications  diltiazem (CARDIZEM) injection 5 mg (has no administration in time range)    ED Course  I have reviewed the triage vital signs and the nursing notes.  Pertinent labs & imaging results that were available during my care of the patient were reviewed by me and considered in my medical decision making (see chart for details).    MDM Rules/Calculators/A&P  Patient well-known to the ED presenting with complaints of  rapid heartbeat and pain in her genitals.  Patient's EKG shows A. fib with rate in the 110-120 range.  This seems consistent with her prior ECGs.  Laboratory studies have returned showing no significant abnormality.  Inspection of her genitalia reveals what appears to be a candidal infection of the labia.  This will be treated with Diflucan and nystatin powder.  I see no indication for admission.  Patient to be [facility] to [facility].  Final Clinical Impression(s) / ED Diagnoses Final diagnoses:  None    Rx / DC Orders ED Discharge Orders    None       Veryl Speak, MD 01/31/20 1844

## 2020-02-06 ENCOUNTER — Telehealth: Payer: Self-pay

## 2020-02-06 NOTE — Telephone Encounter (Signed)
We have made several attempts to schedule pt's surgery. Most recently today I left VM at [facility] transportation line to call us back to schedule this.

## 2020-02-12 ENCOUNTER — Other Ambulatory Visit: Payer: Self-pay

## 2020-02-12 NOTE — Progress Notes (Signed)
Cardiology Office Note    Date:  02/25/2020   ID:  [name], DOB 02-Oct-1950, MRN [id number]  PCP:  Glenda Chroman, MD  Cardiologist: Rozann Lesches, MD EPS: None  Chief Complaint  Patient presents with  . Follow-up    History of Present Illness:  [name] is a 69 y.o. female with a hx of chronic diastolic HF, persistent atrial fibrillation/flutter, anemia, mild-moderate aortic stenosis, ESRD on HD, HTN, HLD, bifascicular block (RBBB+LAFB), COPD, DM2, hx of PE, seizure disorder, schizophrenia, Barrett's esophagus, memory loss, OSA. She was remotely seen by cardiology for diastolic CHF  Patient discharged from the hospital 01/25/2020 with pyelonephritis, acute on chronic respiratory failure with hypoxia, sepsis  Patient went to the ER 01/31/2020 with atrial fibrillation 110- 120 bpm as well as Candida infection.  She was given Diflucan but no other changes made. Has ESRD and had revision of AV fistula 02/20/20.  Patient comes in for follow-up accompanied by staff from [facility] facility.  She was taken off oxygen today.  She has no cardiac complaints.  She has chronic lower extremity edema that is unchanged with dialysis.  Blood pressure runs low pulse around 90-100 on high-dose diltiazem and Toprol.  Past Medical History:  Diagnosis Date  . Anemia   . Angina pectoris ([place])   . Anxiety   . Asthma   . Atrial flutter ([place])   . Back pain   . Barrett esophagus   . Bifascicular block   . BMI 39.0-39.9,adult   . Cellulitis   . CHF (congestive heart failure) ([place])   . Chronic diastolic CHF (congestive heart failure) ([place])   . Constipation   . Contact with and (suspected) exposure to covid-19   . COPD (chronic obstructive pulmonary disease) ([place])   . Dependence on renal dialysis ([place])   . Depression   . Dysphagia   . Dysuria   . Epilepsy ([place])   . ESRD on hemodialysis ([place])   . Essential hypertension   . Fibromyalgia   . Fibromyalgia   . GERD  (gastroesophageal reflux disease)   . Gout   . History of pericarditis   . Hypercholesterolemia   . Hyperlipidemia   . Hypothyroid   . Hypothyroidism   . Insomnia   . Long term (current) use of insulin ([place])   . Major depressive disorder   . Memory loss   . Moderate protein-calorie malnutrition ([place])   . Morbid obesity ([place])   . Nephrolithiasis    2008  . Obesity hypoventilation syndrome (HCC)    CPAP  . Obstructive sleep apnea   . Pain in left arm   . PE (pulmonary embolism)    2010  . Peripheral neuropathy   . Persistent atrial fibrillation ([place])   . Pneumonia   . Polyp of nasal sinus   . PTSD (post-traumatic stress disorder)   . Pulmonary hypertension ([place])   . Rheumatoid arteritis ([place])   . Rheumatoid arthritis ([place])   . Schizophrenia ([place])   . 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 Seizures ([place])   . Steroid dependence ([place])   . Tachycardia, unspecified   . Type 2 diabetes mellitus ([place])   . Unspecified intellectual disabilities   . Unspecified psychosis not due to a substance or known physiological condition ([place])   . Urinary tract infection   . Vitamin D deficiency     Past Surgical History:  Procedure Laterality Date  . A/V FISTULAGRAM N/A 01/28/2020   Procedure: A/V  FISTULAGRAM - Right [place];  Surgeon: Waynetta Sandy, MD;  Location: [facility];  Service: Cardiovascular;  Laterality: N/A;  . APPENDECTOMY    . AV FISTULA PLACEMENT Right 09/27/2019   Procedure: ARTERIOVENOUS (AV) FISTULA CREATION;  Surgeon: Waynetta Sandy, MD;  Location: [facility];  Service: Vascular;  Laterality: Right;  . AV FISTULA PLACEMENT Right 02/20/2020   Procedure: INSERTION OF ARTERIOVENOUS (AV) GORE-TEX GRAFT ARM;  Surgeon: Waynetta Sandy, MD;  Location: [facility];  Service: Vascular;  Laterality: Right;  . CHOLECYSTECTOMY    . COLONOSCOPY     benign polyps (12/2000), repeat colonoscopy performed in 2007  . ENDOMETRIAL BIOPSY     10/03 benign  columnar mucosa (limited material)  . INSERTION OF DIALYSIS CATHETER Right 09/14/2019   Procedure: ATTEMPTED INSERTION OF DIALYSIS CATHETER;  Surgeon: Virl Cagey, MD;  Location: [facility];  Service: General;  Laterality: Right;  . INSERTION OF DIALYSIS CATHETER Right 09/27/2019   Procedure: INSERTION OF DIALYSIS CATHETER;  Surgeon: Waynetta Sandy, MD;  Location: [facility];  Service: Vascular;  Laterality: Right;  . KIDNEY SURGERY    . REVISON OF ARTERIOVENOUS FISTULA Right 02/20/2020   Procedure: REVISON OF ARTERIOVENOUS FISTULA;  Surgeon: Waynetta Sandy, MD;  [facility];  Service: Vascular;  Laterality: Right;    Current Medications: Current Meds  Medication Sig  . acetaminophen (TYLENOL) 500 MG tablet Take 500 mg by mouth 2 (two) times daily.  [name] Kitchen albuterol (VENTOLIN HFA) 108 (90 Base) MCG/ACT inhaler Inhale 1 puff into the lungs every 6 (six) hours as needed for wheezing or shortness of breath.  . Amino Acids (AMINO ACID PROTEIN PO) Take 30 mLs by mouth in the morning and at bedtime.  [name] Kitchen apixaban (ELIQUIS) 5 MG TABS tablet Take 1 tablet (5 mg total) by mouth 2 (two) times daily.  . ARIPiprazole (ABILIFY) 2 MG tablet Take 2 mg by mouth daily.  [name] Kitchen aspirin EC 81 MG tablet Take 1 tablet (81 mg total) by mouth daily with breakfast.  . atorvastatin (LIPITOR) 20 MG tablet Take 20 mg by mouth at bedtime.   [name] Kitchen buPROPion (WELLBUTRIN) 75 MG tablet Take 75 mg by mouth daily.   . calcitRIOL (ROCALTROL) 0.25 MCG capsule Take 1 capsule (0.25 mcg total) by mouth every Monday, Wednesday, and Friday.  . Calcium Acetate 667 MG TABS Take 2,001 mg by mouth 3 (three) times daily.  . calcium carbonate (TUMS - DOSED IN MG ELEMENTAL CALCIUM) 500 MG chewable tablet Chew 500 mg by mouth every 6 (six) hours as needed for indigestion or heartburn.   . Cholecalciferol (VITAMIN D3) 50 MCG (2000 UT) TABS Take 4,000 Units by mouth daily.   [name] Kitchen Dextromethorphan-guaiFENesin 5-100 MG/5ML LIQD Take 5 mLs by  mouth daily as needed (cough).  [name] Kitchen diltiazem (CARDIZEM LA) 360 MG 24 hr tablet Take 360 mg by mouth daily.  . Dulaglutide (TRULICITY) 1.5 GH/8.2XH SOPN Inject 1.5 mg into the skin every Friday.   . DULoxetine (CYMBALTA) 60 MG capsule Take 60 mg by mouth daily.   [name] Kitchen EPINEPHrine (EPI-PEN) 0.3 mg/0.3 mL DEVI Inject 0.3 mg into the muscle once as needed (for bee stings, then go to the ED immediately after using).   . gabapentin (NEURONTIN) 100 MG capsule Take 1 capsule (100 mg total) by mouth 3 (three) times daily.  . insulin aspart (NOVOLOG) 100 UNIT/ML injection Inject 10 Units into the skin 3 (three) times daily before meals. Additional units according to sliding scale 151-200= 2  units 201-250= 4 units 251-300= 6 units 301-350= 8 units 351-400=10units 401-450=12units >400- call MD  . levETIRAcetam (KEPPRA) 250 MG tablet Take 250 mg by mouth 2 (two) times daily.  [name] Kitchen levothyroxine (SYNTHROID) 50 MCG tablet Take 50 mcg by mouth daily before breakfast.  . metoprolol succinate (TOPROL-XL) 200 MG 24 hr tablet Take 1 tablet (200 mg total) by mouth daily. Take with or immediately following a meal.  . montelukast (SINGULAIR) 10 MG tablet Take 10 mg by mouth at bedtime.  . Multiple Vitamin (DAILY VITE PO) Take 1 tablet by mouth every morning.   . nitroGLYCERIN (NITROSTAT) 0.4 MG SL tablet Place 0.4 mg under the tongue every 5 (five) minutes x 3 doses as needed for chest pain.   [name] Kitchen omeprazole (PRILOSEC) 20 MG capsule Take 20 mg by mouth every morning.   . ondansetron (ZOFRAN) 4 MG tablet Take 4 mg by mouth every 6 (six) hours as needed for nausea or vomiting.  . predniSONE (DELTASONE) 10 MG tablet Take 10 mg by mouth daily.  . primidone (MYSOLINE) 50 MG tablet Take 50 mg by mouth daily.   [name] Kitchen pyridoxine (B-6) 200 MG tablet Take 200 mg by mouth in the morning and at bedtime.  . senna (SENOKOT) 8.6 MG tablet Take 1 tablet by mouth 2 (two) times daily.  . traMADol (ULTRAM) 50 MG tablet Take 1 tablet (50 mg  total) by mouth every 8 (eight) hours as needed.  . traZODone (DESYREL) 50 MG tablet Take 50 mg by mouth at bedtime.      Allergies:   Benztropine mesylate, Codeine, Diazepam, Diphenhydramine hcl, Penicillins, Sulfonamide derivatives, Thioridazine hcl, Bee venom, and Lisinopril   Social History   Socioeconomic History  . Marital status: Single    Spouse name: Not on file  . Number of children: 0  . Years of education: Not on file  . Highest education level: 12th grade  Occupational History  . Not on file  Tobacco Use  . Smoking status: Former Smoker    Types: Cigarettes    Quit date: 06/23/1972    Years since quitting: 47.7  . Smokeless tobacco: Never Used  Vaping Use  . Vaping Use: Never used  Substance and Sexual Activity  . Alcohol use: No  . Drug use: No  . Sexual activity: Never  Other Topics Concern  . Not on file  Social History Narrative   Right handed   Caffeine 1 cup per day    Lives at [facility] Strain:   . Difficulty of Paying Living Expenses:   Food Insecurity:   . Worried About Charity fundraiser in the Last Year:   . Arboriculturist in the Last Year:   Transportation Needs:   . Film/video editor (Medical):   [name] Kitchen Lack of Transportation (Non-Medical):   Physical Activity:   . Days of Exercise per Week:   . Minutes of Exercise per Session:   Stress:   . Feeling of Stress :   Social Connections:   . Frequency of Communication with Friends and Family:   . Frequency of Social Gatherings with Friends and Family:   . Attends Religious Services:   . Active Member of Clubs or Organizations:   . Attends Archivist Meetings:   [name] Kitchen Marital Status:      Family History:  The patient's   family history includes Bone cancer in her mother; COPD in her father;  Heart disease in her father and mother; Hypertension in her mother; Stroke in her father.   ROS:   Please see the history of present  illness.    ROS All other systems reviewed and are negative.   PHYSICAL EXAM:   VS:  BP 100/62   Pulse 100   Ht 5\' 2"  (1.575 m)   Wt 199 lb (90.3 kg)   SpO2 100%   BMI 36.40 kg/m   Physical Exam  GEN: Obese, in no acute distress  Neck: no JVD, carotid bruits, or masses Cardiac:irreg 2/6/ sys murmur LSB Respiratory:  clear to auscultation bilaterally, normal work of breathing GI: soft, nontender, nondistended, + BS Ext: +2 edema bilaterally  Neuro:  Alert and Oriented x 3 Psych: euthymic mood, full affect  Wt Readings from Last 3 Encounters:  02/25/20 199 lb (90.3 kg)  02/20/20 191 lb 2.2 oz (86.7 kg)  01/28/20 191 lb (86.6 kg)      Studies/Labs Reviewed:   EKG:  EKG is not ordered today.   Recent Labs: 11/22/2019: B Natriuretic Peptide 1,131.0; TSH 2.274 01/24/2020: ALT 122; Magnesium 2.0 01/31/2020: Platelets 232 02/20/2020: BUN 35; Creatinine, Ser 2.90; Hemoglobin 11.9; Potassium 3.2; Sodium 133   Lipid Panel    Component Value Date/Time   CHOL 160 09/16/2018 0206   TRIG 255 (H) 09/16/2018 0206   HDL 31 (L) 09/16/2018 0206   CHOLHDL 5.2 09/16/2018 0206   VLDL 51 (H) 09/16/2018 0206   LDLCALC 78 09/16/2018 0206   LDLDIRECT 96 02/28/2008 2044    Additional studies/ records that were reviewed today include:  Echo 11/23/19 IMPRESSIONS     1. Left ventricular ejection fraction, by estimation, is 65 to 70%. The  left ventricle has normal function. The left ventricle has no regional  wall motion abnormalities. There is mild left ventricular hypertrophy.  Left ventricular diastolic parameters  are indeterminate.   2. Right ventricular systolic function is normal. The right ventricular  size is moderately enlarged. There is moderately elevated pulmonary artery  systolic pressure.   3. Left atrial size was severely dilated.   4. Right atrial size was severely dilated.   5. The mitral valve is normal in structure. Mild mitral valve  regurgitation. No evidence of  mitral stenosis.   6. Tricuspid valve regurgitation is moderate.   7. The aortic valve is tricuspid. Aortic valve regurgitation is not  visualized. Mild to moderate aortic valve stenosis.   8. The inferior vena cava is normal in size with greater than 50%  respiratory variability, suggesting right atrial pressure of 3 mmHg.   FINDINGS   Left Ventricle: Left ventricular ejection fraction, by estimation, is 65  to 70%. The left ventricle has normal function. The left ventricle has no  regional wall motion abnormalities. The left ventricular internal cavity  size was normal in size. There is   mild left ventricular hypertrophy. Left ventricular diastolic parameters  are indeterminate.   Right Ventricle: The right ventricular size is moderately enlarged. No  increase in right ventricular wall thickness. Right ventricular systolic  function is normal. There is moderately elevated pulmonary artery systolic  pressure. The tricuspid regurgitant   velocity is 3.63 m/s, and with an assumed right atrial pressure of 10  mmHg, the estimated right ventricular systolic pressure is 71.6 mmHg.   Left Atrium: Left atrial size was severely dilated.   Right Atrium: Right atrial size was severely dilated.   Pericardium: There is no evidence of pericardial effusion.   Mitral Valve:  The mitral valve is normal in structure. Mild mitral valve  regurgitation. No evidence of mitral valve stenosis.   Tricuspid Valve: The tricuspid valve is normal in structure. Tricuspid  valve regurgitation is moderate . No evidence of tricuspid stenosis.   Aortic Valve: The aortic valve is tricuspid. . There is mild thickening  and mild calcification of the aortic valve. Aortic valve regurgitation is  not visualized. Mild to moderate aortic stenosis is present. Mild aortic  valve annular calcification. There  is mild thickening of the aortic valve. There is mild calcification of the  aortic valve. Aortic valve mean  gradient measures 15.3 mmHg. Aortic valve  peak gradient measures 26.7 mmHg. Aortic valve area, by VTI measures 1.31  cm.   Pulmonic Valve: The pulmonic valve was not well visualized. Pulmonic valve  regurgitation is not visualized. No evidence of pulmonic stenosis.   Aorta: The aortic root is normal in size and structure.     ASSESSMENT:    1. Chronic diastolic CHF (congestive heart failure) (HCC)   2. Persistent atrial fibrillation ([place])   3. Essential hypertension   4. Hyperlipidemia, unspecified hyperlipidemia type   5. Aortic valve stenosis, etiology of cardiac valve disease unspecified   6. ESRD on hemodialysis ([place])      PLAN:  In order of problems listed above:  Chronic diastolic CHF fluid levels managed by hemodialysis.  2D echo 11/2019 LVEF 65 to 70% with mild LVH, moderate pulmonary hypertension, mild to moderate aortic stenosis and moderate TR. Chronic LE edema-may benefit from more fluid taken off at diasysis  Persistent atrial fibrillation/flutter-plan for rate control on Diltiazem 360 mg daily and Toprol 200 mg daily. BP too low to increase meds. HR 90-low 100's-asymptomatic. No changes.  On Eliquis  Hypertension BP low  Hyperlipidemia LDL 78 on lipitor 09/2018  Valvular heart disease with mild to moderate aortic stenosis and moderate TR  ESRD on HD    Medication Adjustments/Labs and Tests Ordered: Current medicines are reviewed at length with the patient today.  Concerns regarding medicines are outlined above.  Medication changes, Labs and Tests ordered today are listed in the Patient Instructions below. Patient Instructions  Medication Instructions:  Your physician recommends that you continue on your current medications as directed. Please refer to the Current Medication list given to you today.  *If you need a refill on your cardiac medications before your next appointment, please call your pharmacy*   Lab Work: NONE   If you have labs (blood  work) drawn today and your tests are completely normal, you will receive your results only by: [name] Kitchen MyChart Message (if you have MyChart) OR . A paper copy in the mail If you have any lab test that is abnormal or we need to change your treatment, we will call you to review the results.   Testing/Procedures: NONE   Follow-Up: At [facility], you and your health needs are our priority.  As part of our continuing mission to provide you with exceptional heart care, we have created designated Provider Care Teams.  These Care Teams include your primary Cardiologist (physician) and Advanced Practice Providers (APPs -  Physician Assistants and Nurse Practitioners) who all work together to provide you with the care you need, when you need it.  We recommend signing up for the patient portal called "MyChart".  Sign up information is provided on this After Visit Summary.  MyChart is used to connect with patients for Virtual Visits (Telemedicine).  Patients are able to view  lab/test results, encounter notes, upcoming appointments, etc.  Non-urgent messages can be sent to your provider as well.   To learn more about what you can do with MyChart, go to [email or web address].    Your next appointment:   6 month(s)  The format for your next appointment:   In Person  Provider:   Rozann Lesches, MD   Other Instructions Thank you for choosing [facility]!       Sumner Boast, PA-C  02/25/2020 11:53 AM    [facility] [place], [place],   [address] Phone: [phone]; Fax: [phone]

## 2020-02-15 ENCOUNTER — Encounter (HOSPITAL_COMMUNITY): Payer: Self-pay | Admitting: Vascular Surgery

## 2020-02-15 NOTE — Pre-Procedure Instructions (Addendum)
    [name]  02/15/2020     Your procedure is scheduled on Wednesday, July 7..  Report to [facility], Main Entrance or Entrance "A" at 5:30 AM                     Your surgery  is scheduled for 8:30 AM   Call this number if you have problems the morning of surgery: [phone]  This is the number for the Pre- Surgical Desk.  >>>>>Please send patient's Medication Record with medications administrated documentation. ( this information is required prior to OR. This includes medications that may have been on hold for surgery)<<<<<   Remember:  Do not eat or drink after midnight Tuesday, July 6.   Take these medicines the morning of surgery with A SIP OF WATER : ARIPiprazole (ABILIFY)  aspirin EC 81     buPROPion (WELLBUTRIN)      diltiazem (CARDIZEM LA)      gabapentin (NEURONTIN)      levETIRAcetam (KEPPRA)     levothyroxine (SYNTHROID)     metoprolol succinate (TOPROL-XL)      omeprazole (PRILOSEC)      primidone (MYSOLINE)     predniSONE (DELTASONE)  Take if needed: acetaminophen (TYLENOL) albuterol (VENTOLIN HFA) inhaler- send it with patient if possible nitroGLYCERIN (NITROSTAT) ondansetron (ZOFRAN) Follow Surgeons instructions regarding apixaban (ELIQUIS)  DO Not take any Insulin the morning of surgery.    o Check blood sugar the morning of your surgery when you wake up and every 2 hours until you get to the Short Stay unit. . If your blood sugar is less than 70 mg/dL, you will need to treat for low blood sugar: o Treat a low blood sugar (less than 70 mg/dL) with  cup of clear juice (cranberry or apple), 4 glucose tablets, OR glucose gel. Recheck blood sugar in 15 minutes after treatment (to make sure it is greater than 70 mg/dL). If your blood sugar is not greater than 70 mg/dL on recheck, call [phone] o  for further instructions. . Report y blood sugar to the short stay nurse when you get to Short Stay.  Patient should have washup with  antibacteria soap. Wear clean clothes, brush teeth  Do not wear jewelry, make-up or nail polish.  Do not wear lotions, powders, or perfumes, or deodorant.  Do not shave 48 hours prior to surgery.    Do not bring valuables to the hospital.  [facility] is not responsible for any belongings or valuables.  Contacts, dentures or bridgework may not be worn into surgery.

## 2020-02-19 IMAGING — US US RENAL
1 series · 14 of 25 positions shown · non-contrast
Comparison: CT abdomen pelvis 06/01/2018

CLINICAL DATA: Acute kidney injury

EXAM:
RENAL / URINARY TRACT ULTRASOUND COMPLETE

[Series 1: us renal · 0.31mm/px · 14 of 81 slices shown]
[im 1/81]
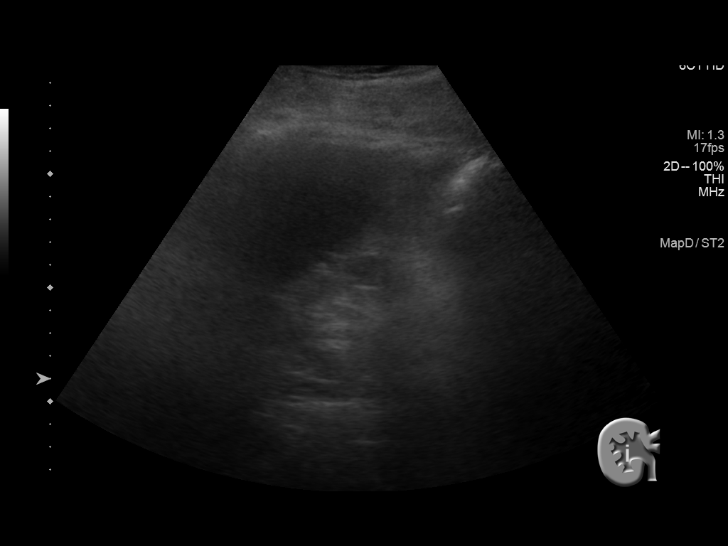
[im 7/81]
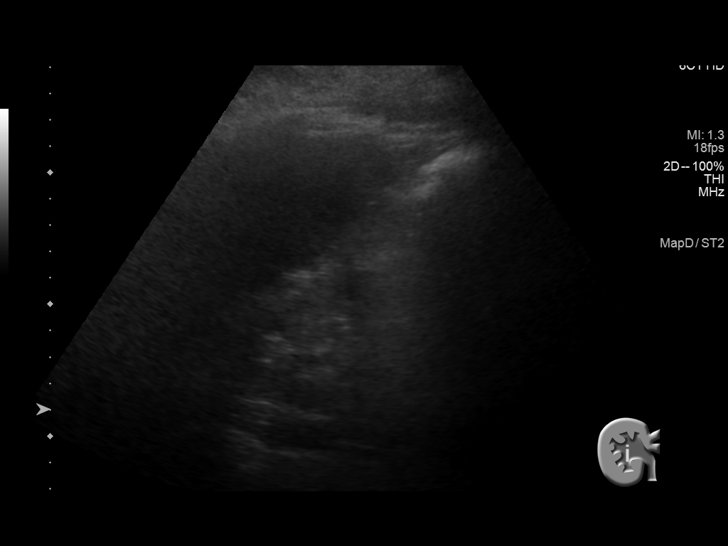
[im 14/81]
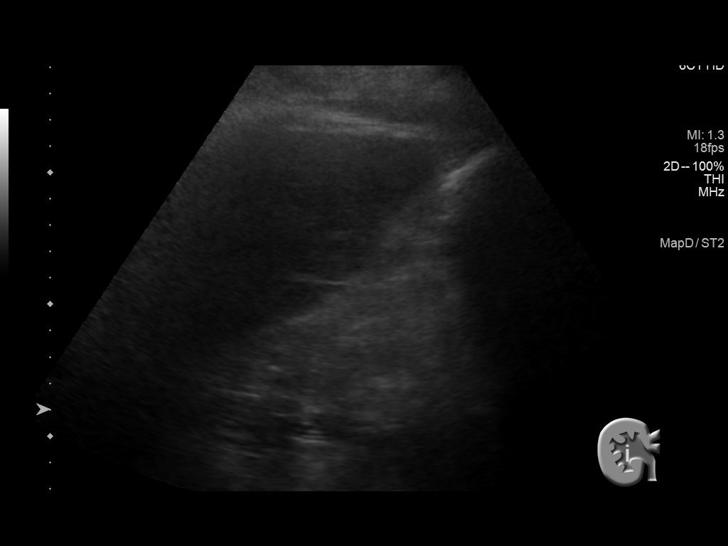
[im 21/81]
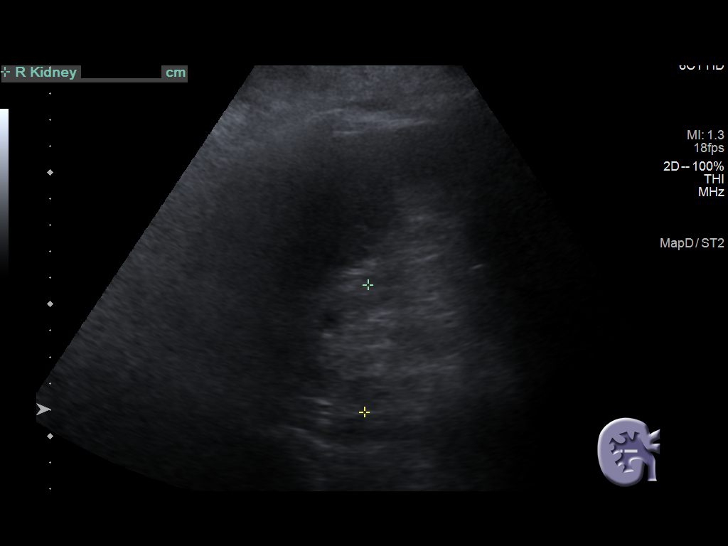
[im 27/81]
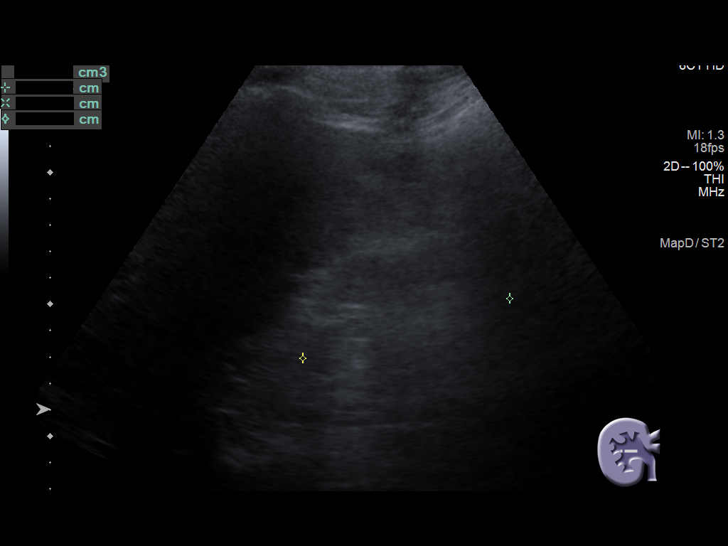
[im 31/81]
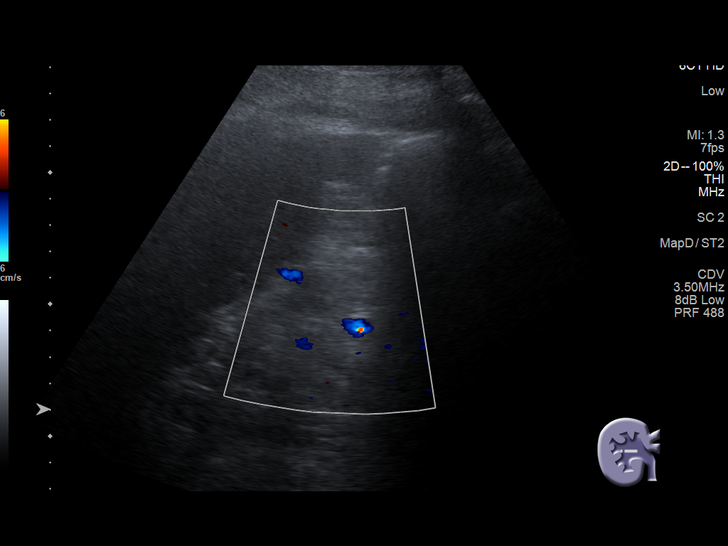
[im 37/81]
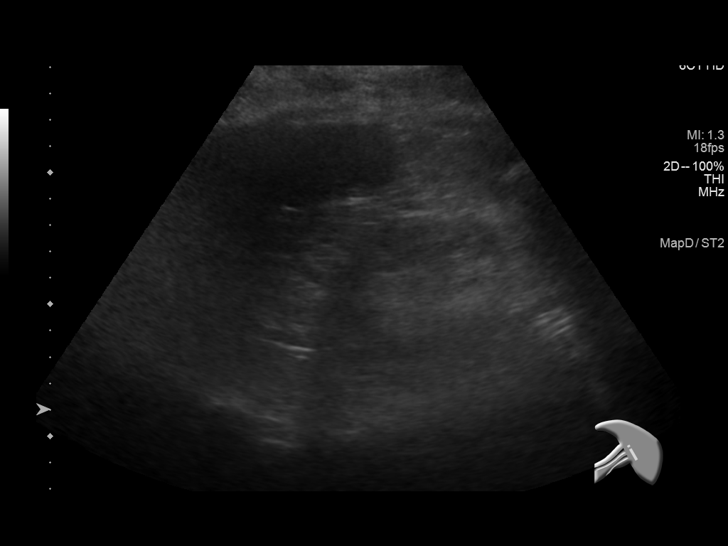
[im 44/81]
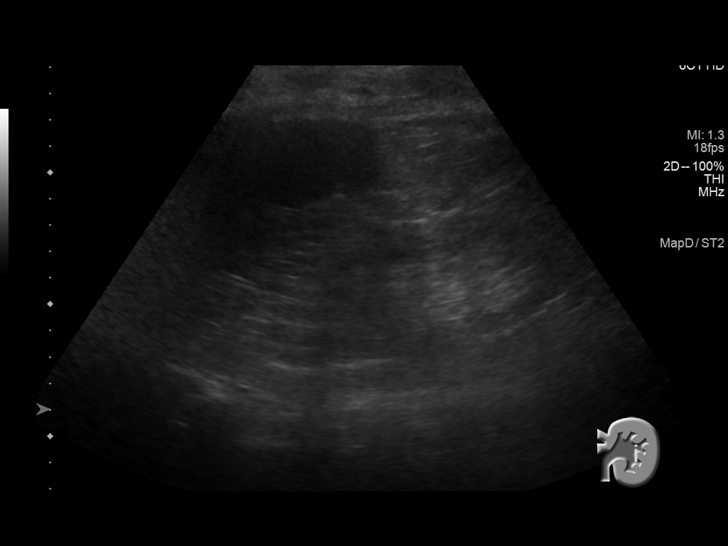
[im 51/81]
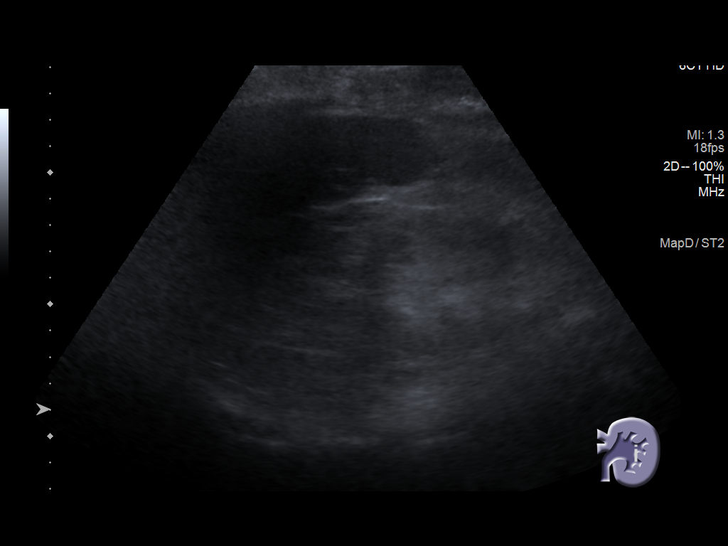
[im 54/81]
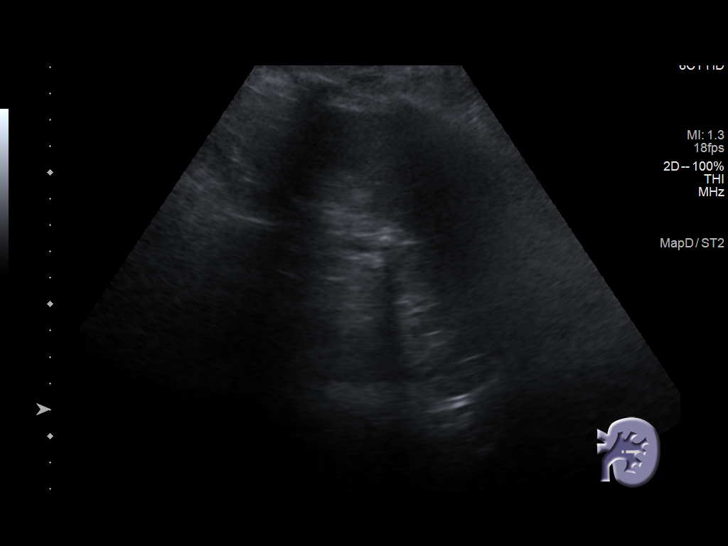
[im 61/81]
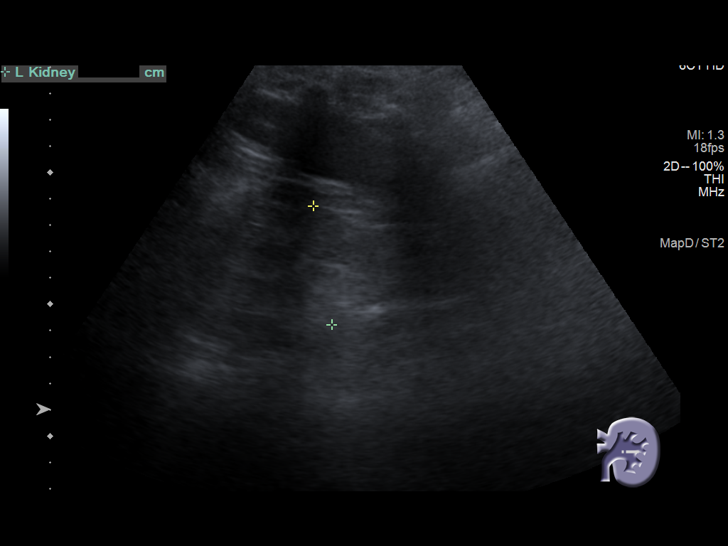
[im 67/81]
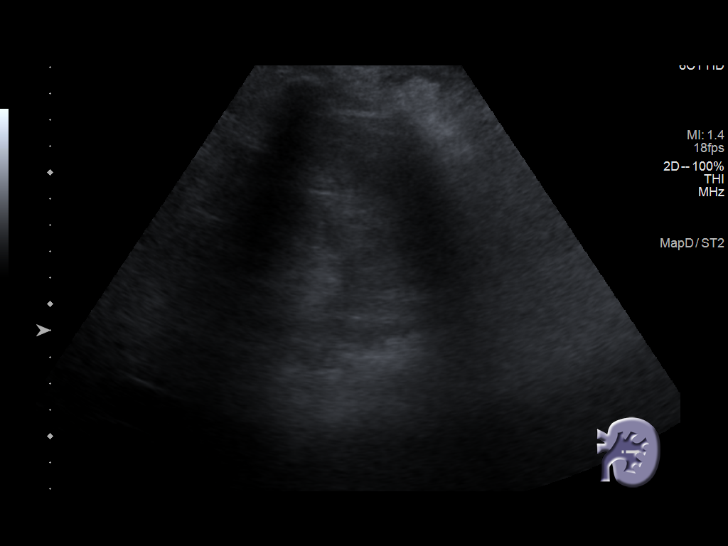
[im 74/81]
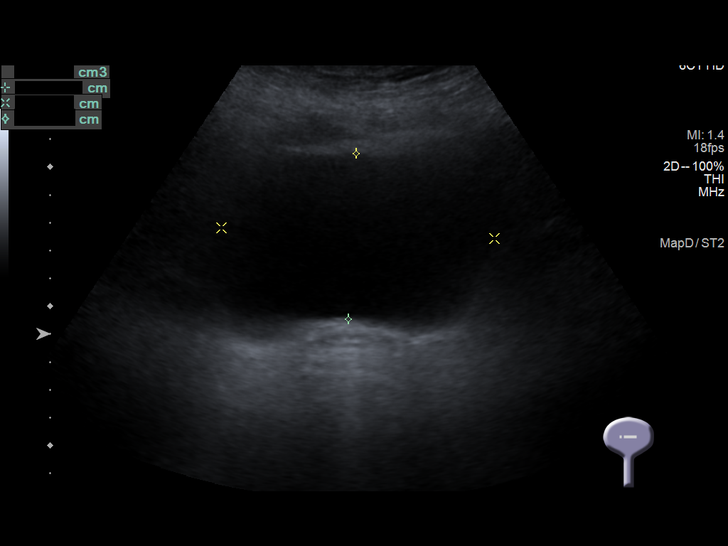
[im 81/81]
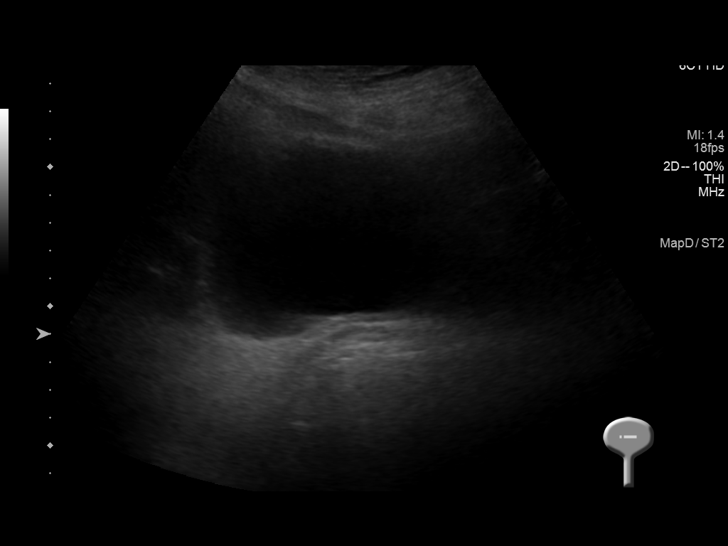

[14 of 25 positions shown; findings below may reference images not displayed]

FINDINGS: Right Kidney:

Renal measurements: 8.2 x 4.5 x 5.3 cm = volume: 102 mL. Poor
visualization of RIGHT kidney due to body habitus. Increased
cortical echogenicity. Cortical thinning. No gross evidence of mass
or hydronephrosis on limited assessment

Left Kidney:

Renal measurements: 9.4 x 4.5 x 5.3 cm = volume: 130 mL. Cortical
thinning with increased cortical echogenicity. No mass or
hydronephrosis.

Bladder:

Well distended, unremarkable.  Patient was unable to void.

Other:

N/A
IMPRESSION: Limited visualization of RIGHT kidney due to body habitus.

BILATERAL renal cortical atrophy and medical renal disease changes.

No gross evidence of mass or hydronephrosis within limitations of
exam.

## 2020-02-19 NOTE — Progress Notes (Addendum)
I called [facility] and spoke to [facility], [place].  Remo Lipps had patient instruction sheet that I faxed Friday evening.  I told Remo Lipps that the time of arrival is wrong, that patient should arrive at 0530 and that I spoke with [facility] of transportation and she is aware of time of arrival.  Remo Lipps reports that [facility] in the past 2 days have bee [phone].   I just noticed that 1st cases are 8:30 I called [name]  and left a voice message for Remo Lipps and I spoke with Arbie Cookey, Electrical engineer and informed her that the true arrival time is 0630. j

## 2020-02-19 NOTE — Anesthesia Preprocedure Evaluation (Addendum)
Anesthesia Evaluation  Patient identified by MRN, date of birth, ID band Patient awake    Reviewed: Allergy & Precautions, NPO status , Patient's Chart, lab work & pertinent test results  Airway Mallampati: III  TM Distance: >3 FB Neck ROM: Full    Dental  (+) Poor Dentition, Missing   Pulmonary asthma , sleep apnea , COPD,  COPD inhaler and oxygen dependent, former smoker, PE   Pulmonary exam normal breath sounds clear to auscultation       Cardiovascular hypertension, Pt. on medications and Pt. on home beta blockers + angina +CHF  Normal cardiovascular exam+ dysrhythmias Atrial Fibrillation  Rhythm:Regular Rate:Normal  ECG: a-fib, rate 118   Neuro/Psych Seizures -, Well Controlled,  PSYCHIATRIC DISORDERS Anxiety Depression Schizophrenia Schizophrenia    GI/Hepatic Neg liver ROS, GERD  Medicated and Controlled,  Endo/Other  diabetes, Insulin DependentHypothyroidism   Renal/GU ESRF and DialysisRenal disease (On HD T,R,Sat)     Musculoskeletal  (+) Arthritis , Fibromyalgia -Back pain   Abdominal   Peds  Hematology HLD   Anesthesia Other Findings END STAGE RENAL DISEASE  Reproductive/Obstetrics                           Anesthesia Physical Anesthesia Plan  ASA: IV  Anesthesia Plan: Regional   Post-op Pain Management:    Induction: Intravenous  PONV Risk Score and Plan: 2 and Ondansetron, Propofol infusion and Treatment may vary due to age or medical condition  Airway Management Planned: Simple Face Mask  Additional Equipment:   Intra-op Plan:   Post-operative Plan:   Informed Consent: I have reviewed the patients History and Physical, chart, labs and discussed the procedure including the risks, benefits and alternatives for the proposed anesthesia with the patient or authorized representative who has indicated his/her understanding and acceptance.     Dental advisory  given  Plan Discussed with: CRNA  Anesthesia Plan Comments: (PAT note by Karoline Caldwell, PA-C: She has a hx of chronic diastolic HF; Persistent Atrial fib/ RBBB with RVR at times on toprol XL 200 mg daly and dilt 360 mg daily, unable to increase, on anticoagulation with eliquis, plan is for rate control only; mild-moderate aortic stenosis, ESRD on HD TTS; COPD; IDDM2; hx of PE; seizure disorder on Keppra; schizophrenia; Barrett's esophagus; memory loss; OSA; RA on chronic steroids.  She was admitted in January-February 2021 with respiratory failure/PNA and progressed to needing dialysis. During admission she was reported to have new onset atrial fibrillation (later deemed flutter per cardiology note) and was placed on Eliquis. She was readmitted 11/2019 with racing heart, found to have SVT vs atrial flutter. Her carvedilol was changed to metoprolol. She also had elevated troponin in setting of severe tachycardia, ESRD, fluid overload,which wasnot felt to represent ACS. She required addition of diltiazem. Echo 11/2019 showed EF 65-70%, mild LVH, moderate pulmonary HTN, severe biatrial enlargement, mild-moderate aortic stenosis and moderate tricuspid regurgitation.  She was again admitted 6/9-6/11/21 with acute hypoxic respiratory failure requiring BiPAP setting of volume overload and pleural effusions. Hypoxia resolved after hemodialysis. She was treated for COPD exacerbation and presumed UTI. She was not requiring oxygen at discharge.  Last seen by cardiology 01/21/20, noted persistent tachycardia on maximally tolerated regimen of metoprolol and diltiazem. Plan is for rate control only. Discussed possibility she may need additional fluid removal at dialysis sessions.    Most recent A1c 5.6 on 11/24/19, however recent glucose measurements indicate poor control: Glucose, Bld (mg/dL) Date  Value 01/31/2020 303 (H) 01/28/2020 253 (H) 01/25/2020 473 (H) 01/24/2020 353 (H)  Will need DOS labs and eval.    EKG 01/31/20: Atrial fibrillation. Vent rate 118. RBBB and LAFB. LVH with secondary repolarization abnormality. No significant change since 01/24/2020  TTE 11/23/19: 1. Left ventricular ejection fraction, by estimation, is 65 to 70%. The  left ventricle has normal function. The left ventricle has no regional  wall motion abnormalities. There is mild left ventricular hypertrophy.  Left ventricular diastolic parameters  are indeterminate.  2. Right ventricular systolic function is normal. The right ventricular  size is moderately enlarged. There is moderately elevated pulmonary artery  systolic pressure.  3. Left atrial size was severely dilated.  4. Right atrial size was severely dilated.  5. The mitral valve is normal in structure. Mild mitral valve  regurgitation. No evidence of mitral stenosis.  6. Tricuspid valve regurgitation is moderate.  7. The aortic valve is tricuspid. Aortic valve regurgitation is not  visualized. Mild to moderate aortic valve stenosis.  8. The inferior vena cava is normal in size with greater than 50%  respiratory variability, suggesting right atrial pressure of 3 mmHg.   )      Anesthesia Quick Evaluation

## 2020-02-19 NOTE — Progress Notes (Signed)
Anesthesia Chart Review: Same day workup  She has a hx of chronic diastolic HF; Persistent Atrial fib/ RBBB with RVR at times on toprol XL 200 mg daly and dilt 360 mg daily, unable to increase, on anticoagulation with eliquis, plan is for rate control only; mild-moderate aortic stenosis, ESRD on HD TTS; COPD; IDDM2; hx of PE; seizure disorder on Keppra; schizophrenia; Barrett's esophagus; memory loss; OSA; RA on chronic steroids.  She was admitted in January-February 2021 with respiratory failure/PNA and progressed to needing dialysis. During admission she was reported to have new onset atrial fibrillation (later deemed flutter per cardiology note) and was placed on Eliquis. She was readmitted 11/2019 with racing heart, found to have SVT vs atrial flutter. Her carvedilol was changed to metoprolol. She also had elevated troponin in setting of severe tachycardia, ESRD, fluid overload,which wasnot felt to represent ACS. She required addition of diltiazem. Echo 11/2019 showed EF 65-70%, mild LVH, moderate pulmonary HTN, severe biatrial enlargement, mild-moderate aortic stenosis and moderate tricuspid regurgitation.  She was again admitted 6/9-6/11/21 with acute hypoxic respiratory failure requiring BiPAP setting of volume overload and pleural effusions. Hypoxia resolved after hemodialysis. She was treated for COPD exacerbation and presumed UTI. She was not requiring oxygen at discharge.  Last seen by cardiology 01/21/20, noted persistent tachycardia on maximally tolerated regimen of metoprolol and diltiazem. Plan is for rate control only. Discussed possibility she may need additional fluid removal at dialysis sessions.    Most recent A1c 5.6 on 11/24/19, however recent glucose measurements indicate poor control: Glucose, Bld (mg/dL)  Date Value  01/31/2020 303 (H)  01/28/2020 253 (H)  01/25/2020 473 (H)  01/24/2020 353 (H)   Will need DOS labs and eval.   EKG 01/31/20: Atrial fibrillation. Vent rate 118.  RBBB and LAFB. LVH with secondary repolarization abnormality. No significant change since 01/24/2020  TTE 11/23/19: 1. Left ventricular ejection fraction, by estimation, is 65 to 70%. The  left ventricle has normal function. The left ventricle has no regional  wall motion abnormalities. There is mild left ventricular hypertrophy.  Left ventricular diastolic parameters  are indeterminate.  2. Right ventricular systolic function is normal. The right ventricular  size is moderately enlarged. There is moderately elevated pulmonary artery  systolic pressure.  3. Left atrial size was severely dilated.  4. Right atrial size was severely dilated.  5. The mitral valve is normal in structure. Mild mitral valve  regurgitation. No evidence of mitral stenosis.  6. Tricuspid valve regurgitation is moderate.  7. The aortic valve is tricuspid. Aortic valve regurgitation is not  visualized. Mild to moderate aortic valve stenosis.  8. The inferior vena cava is normal in size with greater than 50%  respiratory variability, suggesting right atrial pressure of 3 mmHg.    Wynonia Musty [facility] Short Stay Center/Anesthesiology Phone [phone] 02/19/2020 9:44 AM

## 2020-02-20 ENCOUNTER — Encounter (HOSPITAL_COMMUNITY): Payer: Self-pay | Admitting: Vascular Surgery

## 2020-02-20 ENCOUNTER — Ambulatory Visit (HOSPITAL_COMMUNITY): Payer: Medicare Other | Admitting: Physician Assistant

## 2020-02-20 ENCOUNTER — Ambulatory Visit (HOSPITAL_COMMUNITY)
Admission: RE | Admit: 2020-02-20 | Discharge: 2020-02-20 | Disposition: A | Discharge status: Home / Self Care | Payer: Medicare Other | Attending: Vascular Surgery | Admitting: Vascular Surgery

## 2020-02-20 ENCOUNTER — Encounter (HOSPITAL_COMMUNITY)
Admission: RE | Disposition: A | Discharge status: Home / Self Care | Payer: Self-pay | Source: Home / Self Care | Attending: Vascular Surgery

## 2020-02-20 ENCOUNTER — Other Ambulatory Visit: Payer: Self-pay

## 2020-02-20 DIAGNOSIS — M549 Dorsalgia, unspecified: Secondary | ICD-10-CM | POA: Insufficient documentation

## 2020-02-20 DIAGNOSIS — I209 Angina pectoris, unspecified: Secondary | ICD-10-CM | POA: Insufficient documentation

## 2020-02-20 DIAGNOSIS — Z794 Long term (current) use of insulin: Secondary | ICD-10-CM | POA: Insufficient documentation

## 2020-02-20 DIAGNOSIS — K219 Gastro-esophageal reflux disease without esophagitis: Secondary | ICD-10-CM | POA: Diagnosis not present

## 2020-02-20 DIAGNOSIS — I4891 Unspecified atrial fibrillation: Secondary | ICD-10-CM | POA: Insufficient documentation

## 2020-02-20 DIAGNOSIS — F419 Anxiety disorder, unspecified: Secondary | ICD-10-CM | POA: Diagnosis not present

## 2020-02-20 DIAGNOSIS — M797 Fibromyalgia: Secondary | ICD-10-CM | POA: Insufficient documentation

## 2020-02-20 DIAGNOSIS — M1990 Unspecified osteoarthritis, unspecified site: Secondary | ICD-10-CM | POA: Diagnosis not present

## 2020-02-20 DIAGNOSIS — J449 Chronic obstructive pulmonary disease, unspecified: Secondary | ICD-10-CM | POA: Diagnosis not present

## 2020-02-20 DIAGNOSIS — N186 End stage renal disease: Secondary | ICD-10-CM | POA: Insufficient documentation

## 2020-02-20 DIAGNOSIS — E785 Hyperlipidemia, unspecified: Secondary | ICD-10-CM | POA: Insufficient documentation

## 2020-02-20 DIAGNOSIS — Z992 Dependence on renal dialysis: Secondary | ICD-10-CM | POA: Insufficient documentation

## 2020-02-20 DIAGNOSIS — E1122 Type 2 diabetes mellitus with diabetic chronic kidney disease: Secondary | ICD-10-CM | POA: Insufficient documentation

## 2020-02-20 DIAGNOSIS — Z9981 Dependence on supplemental oxygen: Secondary | ICD-10-CM | POA: Diagnosis not present

## 2020-02-20 DIAGNOSIS — G4730 Sleep apnea, unspecified: Secondary | ICD-10-CM | POA: Insufficient documentation

## 2020-02-20 DIAGNOSIS — R569 Unspecified convulsions: Secondary | ICD-10-CM | POA: Diagnosis not present

## 2020-02-20 DIAGNOSIS — I132 Hypertensive heart and chronic kidney disease with heart failure and with stage 5 chronic kidney disease, or end stage renal disease: Secondary | ICD-10-CM | POA: Diagnosis not present

## 2020-02-20 DIAGNOSIS — T82898A Other specified complication of vascular prosthetic devices, implants and grafts, initial encounter: Secondary | ICD-10-CM | POA: Insufficient documentation

## 2020-02-20 DIAGNOSIS — I509 Heart failure, unspecified: Secondary | ICD-10-CM | POA: Insufficient documentation

## 2020-02-20 DIAGNOSIS — E119 Type 2 diabetes mellitus without complications: Secondary | ICD-10-CM | POA: Diagnosis not present

## 2020-02-20 DIAGNOSIS — Z87891 Personal history of nicotine dependence: Secondary | ICD-10-CM | POA: Insufficient documentation

## 2020-02-20 DIAGNOSIS — Z20822 Contact with and (suspected) exposure to covid-19: Secondary | ICD-10-CM | POA: Insufficient documentation

## 2020-02-20 DIAGNOSIS — X58XXXA Exposure to other specified factors, initial encounter: Secondary | ICD-10-CM | POA: Diagnosis not present

## 2020-02-20 DIAGNOSIS — F209 Schizophrenia, unspecified: Secondary | ICD-10-CM | POA: Diagnosis not present

## 2020-02-20 DIAGNOSIS — F329 Major depressive disorder, single episode, unspecified: Secondary | ICD-10-CM | POA: Diagnosis not present

## 2020-02-20 HISTORY — PX: REVISON OF ARTERIOVENOUS FISTULA: SHX6074

## 2020-02-20 HISTORY — DX: Pneumonia, unspecified organism: J18.9

## 2020-02-20 HISTORY — PX: AV FISTULA PLACEMENT: SHX1204

## 2020-02-20 LAB — GLUCOSE, CAPILLARY
Glucose-Capillary: 140 mg/dL — ABNORMAL HIGH (ref 70–99)
Glucose-Capillary: 258 mg/dL — ABNORMAL HIGH (ref 70–99)

## 2020-02-20 LAB — POCT I-STAT, CHEM 8
BUN: 35 mg/dL — ABNORMAL HIGH (ref 8–23)
Calcium, Ion: 1.17 mmol/L (ref 1.15–1.40)
Chloride: 93 mmol/L — ABNORMAL LOW (ref 98–111)
Creatinine, Ser: 2.9 mg/dL — ABNORMAL HIGH (ref 0.44–1.00)
Glucose, Bld: 156 mg/dL — ABNORMAL HIGH (ref 70–99)
HCT: 35 % — ABNORMAL LOW (ref 36.0–46.0)
Hemoglobin: 11.9 g/dL — ABNORMAL LOW (ref 12.0–15.0)
Potassium: 3.2 mmol/L — ABNORMAL LOW (ref 3.5–5.1)
Sodium: 133 mmol/L — ABNORMAL LOW (ref 135–145)
TCO2: 28 mmol/L (ref 22–32)

## 2020-02-20 LAB — PROTIME-INR
INR: 1.2 (ref 0.8–1.2)
Prothrombin Time: 14.3 seconds (ref 11.4–15.2)

## 2020-02-20 LAB — SARS CORONAVIRUS 2 BY RT PCR (HOSPITAL ORDER, PERFORMED IN ~~LOC~~ HOSPITAL LAB): SARS Coronavirus 2: NEGATIVE

## 2020-02-20 SURGERY — INSERTION OF ARTERIOVENOUS (AV) GORE-TEX GRAFT ARM
Anesthesia: Monitor Anesthesia Care | Site: Arm Upper | Laterality: Right

## 2020-02-20 MED ORDER — ACETAMINOPHEN 500 MG PO TABS
500.0000 mg | ORAL_TABLET | Freq: Once | ORAL | Status: AC
  Administered 2020-02-20: 500 mg via ORAL
  Filled 2020-02-20: qty 1

## 2020-02-20 MED ORDER — 0.9 % SODIUM CHLORIDE (POUR BTL) OPTIME
TOPICAL | Status: DC | PRN
  Administered 2020-02-20: 1000 mL

## 2020-02-20 MED ORDER — CHLORHEXIDINE GLUCONATE 4 % EX LIQD: 60.0000 mL | Freq: Once | CUTANEOUS | Status: DC

## 2020-02-20 MED ORDER — FENTANYL CITRATE (PF) 100 MCG/2ML IJ SOLN: 25.0000 ug | INTRAMUSCULAR | Status: DC | PRN

## 2020-02-20 MED ORDER — ORAL CARE MOUTH RINSE: 15.0000 mL | Freq: Once | OROMUCOSAL | Status: AC

## 2020-02-20 MED ORDER — PROPOFOL 500 MG/50ML IV EMUL
INTRAVENOUS | Status: DC | PRN
  Administered 2020-02-20: 50 ug/kg/min via INTRAVENOUS

## 2020-02-20 MED ORDER — LIDOCAINE-EPINEPHRINE (PF) 1 %-1:200000 IJ SOLN
INTRAMUSCULAR | Status: AC
  Filled 2020-02-20: qty 30

## 2020-02-20 MED ORDER — CHLORHEXIDINE GLUCONATE 0.12 % MT SOLN: 15.0000 mL | Freq: Once | OROMUCOSAL | Status: AC

## 2020-02-20 MED ORDER — CLONIDINE HCL (ANALGESIA) 100 MCG/ML EP SOLN
EPIDURAL | Status: DC | PRN
  Administered 2020-02-20: 100 ug

## 2020-02-20 MED ORDER — MIDAZOLAM HCL 2 MG/2ML IJ SOLN
INTRAMUSCULAR | Status: AC
  Filled 2020-02-20: qty 2

## 2020-02-20 MED ORDER — ONDANSETRON HCL 4 MG/2ML IJ SOLN
INTRAMUSCULAR | Status: AC
  Filled 2020-02-20: qty 2

## 2020-02-20 MED ORDER — TRAMADOL HCL 50 MG PO TABS: 50.0000 mg | ORAL_TABLET | Freq: Three times a day (TID) | ORAL | 0 refills | Status: DC | PRN

## 2020-02-20 MED ORDER — CHLORHEXIDINE GLUCONATE 0.12 % MT SOLN
OROMUCOSAL | Status: AC
  Administered 2020-02-20: 15 mL via OROMUCOSAL
  Filled 2020-02-20: qty 15

## 2020-02-20 MED ORDER — FENTANYL CITRATE (PF) 100 MCG/2ML IJ SOLN
INTRAMUSCULAR | Status: DC | PRN
  Administered 2020-02-20: 25 ug via INTRAVENOUS
  Administered 2020-02-20: 50 ug via INTRAVENOUS

## 2020-02-20 MED ORDER — ONDANSETRON HCL 4 MG/2ML IJ SOLN: 4.0000 mg | Freq: Once | INTRAMUSCULAR | Status: DC | PRN

## 2020-02-20 MED ORDER — SODIUM CHLORIDE 0.9 % IV SOLN: INTRAVENOUS | Status: DC

## 2020-02-20 MED ORDER — FENTANYL CITRATE (PF) 250 MCG/5ML IJ SOLN
INTRAMUSCULAR | Status: AC
  Filled 2020-02-20: qty 5

## 2020-02-20 MED ORDER — SODIUM CHLORIDE 0.9 % IV SOLN
INTRAVENOUS | Status: DC | PRN
  Administered 2020-02-20: 500 mL

## 2020-02-20 MED ORDER — SODIUM CHLORIDE 0.9 % IV SOLN
INTRAVENOUS | Status: AC
  Filled 2020-02-20: qty 1.2

## 2020-02-20 MED ORDER — VANCOMYCIN HCL IN DEXTROSE 1-5 GM/200ML-% IV SOLN
1000.0000 mg | INTRAVENOUS | Status: AC
  Administered 2020-02-20: 1000 mg via INTRAVENOUS
  Filled 2020-02-20: qty 200

## 2020-02-20 MED ORDER — LACTATED RINGERS IV SOLN: INTRAVENOUS | Status: DC

## 2020-02-20 MED ORDER — ONDANSETRON HCL 4 MG/2ML IJ SOLN
INTRAMUSCULAR | Status: DC | PRN
  Administered 2020-02-20: 4 mg via INTRAVENOUS

## 2020-02-20 MED ORDER — PROPOFOL 10 MG/ML IV BOLUS
INTRAVENOUS | Status: AC
  Filled 2020-02-20: qty 20

## 2020-02-20 MED ORDER — LIDOCAINE-EPINEPHRINE (PF) 1.5 %-1:200000 IJ SOLN
INTRAMUSCULAR | Status: DC | PRN
  Administered 2020-02-20: 30 mL via PERINEURAL

## 2020-02-20 SURGICAL SUPPLY — 33 items
ADH SKN CLS APL DERMABOND .7 (GAUZE/BANDAGES/DRESSINGS) ×1
ARMBAND PINK RESTRICT EXTREMIT (MISCELLANEOUS) ×4 IMPLANT
CANISTER SUCT 3000ML PPV (MISCELLANEOUS) ×2 IMPLANT
CLIP VESOCCLUDE MED 6/CT (CLIP) ×2 IMPLANT
CLIP VESOCCLUDE SM WIDE 6/CT (CLIP) ×2 IMPLANT
COVER PROBE W GEL 5X96 (DRAPES) ×2 IMPLANT
COVER WAND RF STERILE (DRAPES) ×2 IMPLANT
DERMABOND ADVANCED (GAUZE/BANDAGES/DRESSINGS) ×1
DERMABOND ADVANCED .7 DNX12 (GAUZE/BANDAGES/DRESSINGS) ×1 IMPLANT
ELECT REM PT RETURN 9FT ADLT (ELECTROSURGICAL) ×2
ELECTRODE REM PT RTRN 9FT ADLT (ELECTROSURGICAL) ×1 IMPLANT
GLOVE BIO SURGEON STRL SZ7.5 (GLOVE) ×2 IMPLANT
GOWN STRL REUS W/ TWL LRG LVL3 (GOWN DISPOSABLE) ×2 IMPLANT
GOWN STRL REUS W/ TWL XL LVL3 (GOWN DISPOSABLE) ×1 IMPLANT
GOWN STRL REUS W/TWL LRG LVL3 (GOWN DISPOSABLE) ×4
GOWN STRL REUS W/TWL XL LVL3 (GOWN DISPOSABLE) ×2
GRAFT GORETEX STRT 6X50 (Vascular Products) ×1 IMPLANT
HEMOSTAT SNOW SURGICEL 2X4 (HEMOSTASIS) IMPLANT
INSERT FOGARTY SM (MISCELLANEOUS) ×2 IMPLANT
KIT BASIN OR (CUSTOM PROCEDURE TRAY) ×2 IMPLANT
KIT TURNOVER KIT B (KITS) ×2 IMPLANT
NS IRRIG 1000ML POUR BTL (IV SOLUTION) ×2 IMPLANT
PACK CV ACCESS (CUSTOM PROCEDURE TRAY) ×2 IMPLANT
PAD ARMBOARD 7.5X6 YLW CONV (MISCELLANEOUS) ×4 IMPLANT
SUT MNCRL AB 4-0 PS2 18 (SUTURE) ×3 IMPLANT
SUT PROLENE 6 0 BV (SUTURE) ×4 IMPLANT
SUT SILK 2 0 SH (SUTURE) ×1 IMPLANT
SUT VIC AB 3-0 SH 27 (SUTURE) ×4
SUT VIC AB 3-0 SH 27X BRD (SUTURE) ×2 IMPLANT
SYR TOOMEY 50ML (SYRINGE) IMPLANT
TOWEL GREEN STERILE (TOWEL DISPOSABLE) ×2 IMPLANT
UNDERPAD 30X36 HEAVY ABSORB (UNDERPADS AND DIAPERS) ×2 IMPLANT
WATER STERILE IRR 1000ML POUR (IV SOLUTION) ×2 IMPLANT

## 2020-02-20 NOTE — Anesthesia Postprocedure Evaluation (Signed)
Anesthesia Post Note  Patient: [name]  Procedure(s) Performed: INSERTION OF ARTERIOVENOUS (AV) GORE-TEX GRAFT ARM (Right Arm Upper) REVISON OF ARTERIOVENOUS FISTULA (Right Arm Upper)     Patient location during evaluation: PACU Anesthesia Type: Regional Level of consciousness: awake Pain management: pain level controlled Vital Signs Assessment: post-procedure vital signs reviewed and stable Respiratory status: spontaneous breathing, nonlabored ventilation, respiratory function stable and patient connected to nasal cannula oxygen Cardiovascular status: stable and blood pressure returned to baseline Postop Assessment: no apparent nausea or vomiting Anesthetic complications: no   No complications documented.  Last Vitals:  Vitals:   02/20/20 1020 02/20/20 1034  BP: 140/73 (!) 154/82  Pulse: 72 82  Resp: 20 17  Temp:  36.7 C  SpO2: 100% 96%    Last Pain:  Vitals:   02/20/20 1020  TempSrc:   PainSc: 0-No pain                 Dariella Gillihan P Melo Stauber

## 2020-02-20 NOTE — Anesthesia Procedure Notes (Signed)
Procedure Name: MAC Date/Time: 02/20/2020 8:35 AM Performed by: Trinna Post., CRNA Pre-anesthesia Checklist: Patient identified, Emergency Drugs available, Suction available, Patient being monitored and Timeout performed Patient Re-evaluated:Patient Re-evaluated prior to induction Oxygen Delivery Method: Simple face mask Preoxygenation: Pre-oxygenation with 100% oxygen Induction Type: IV induction Placement Confirmation: positive ETCO2

## 2020-02-20 NOTE — H&P (Signed)
   HPI  69 year old female follows up after right arm brachiocephalic AV fistula creation with TDC placement.  She states catheter is working well.  Fistula has not been used.  She remains on blood thinners.  She denies any right upper extremity issues.  She currently resides in [place] she is very pleased with her care there.   Review of Systems No complaints today    Objective:   Vitals:   02/20/20 0709  BP: 125/63  Pulse: 95  Resp: 20  Temp: 97.8 F (36.6 C)  SpO2: 100%    Awake alert oriented Nonlabored respirations Right upper arm fistula with strong thrill Palpable bilateral radial pulses     Data: I have independently interpreted her dialysis access duplex.  Flow volume 1006 mL/min.  Diameter up to 0.68 cm in the upper arm.  Depth is high as 1.54 cm in the proximal upper arm only 0.5 cm in the mid arm.     Assessment/plan      69 year old female with end-stage renal disease will need revision of right arm avf to possible graft.     Kendria Halberg C. Donzetta Matters, MD [facility] [place] Office: [phone] Pager: [phone]

## 2020-02-20 NOTE — Op Note (Addendum)
    Patient name: [name] MRN: [id number] DOB: [birth year] Sex: female  Diagnosis: End-stage renal disease Procedure performed: Revision of right arm AV fistula with interposition 6 mm PTFE graft Surgeon: Servando Snare, MD Assistant: Luanne Bras, RNFA  Indications: 69 year old female on dialysis via catheter.  She has a previously placed right arm cephalic vein fistula that has failed to mature and she is now indicated for revision with possible graft placement.  Findings: The vein was very thin and friable.  We placed interposition 6 mm graft at completion was a strong thrill and a palpable radial wrist.   Procedure:  The patient was identified in the holding area and taken to the operating room she was placed supine on the operative table MAC anesthesia was induced.  Preoperative block was in place.  Timeout was called antibiotics administered.  We opened her previous incision near the antecubital space.  We also created an incision in the upper arm.  As we are dissecting at the previous anastomosis the fistula did tear we urgently clamped it and divided it tied off distally.  There are several centimeters dissected at the base of the morning orientation transected via the in the mid space.  We flushed centrally with heparinized saline.  We then tunneled a 6 mm PTFE graft.  We sewed it in the end first near the axilla flushed with heparinized saline.  We then trimmed to size to the antecubitum.  We cleaned up the edges of our antecubital fistula and then sewed end-to-end there.  Prior completion allowed flushing all directions.  Upon completion of well there was confirmed with Doppler distally plan axilla in the vein side.  He also had good radial artery pulse the wrist also confirmed with Doppler.  Satisfied we obtained stasis irrigated the wounds closed in layers of Vicryl Monocryl.  Dermabond placed to the level of the skin.  She was awake from anesthesia having tolerated procedure well any  complication.  All counts were correct completion.  EBL: 100 cc   Fleming Prill C. Donzetta Matters, MD [facility] [place] Office: [phone] Pager: [phone]

## 2020-02-20 NOTE — Discharge Instructions (Signed)
[facility]  Discharge Instructions  AV Fistula or Graft Surgery for Dialysis Access  Please refer to the following instructions for your post-procedure care. Your surgeon or physician assistant will discuss any changes with you.  Activity  You may drive the day following your surgery, if you are comfortable and no longer taking prescription pain medication. Resume full activity as the soreness in your incision resolves.  Bathing/Showering  You may shower after you go home. Keep your incision dry for 48 hours. Do not soak in a bathtub, hot tub, or swim until the incision heals completely. You may not shower if you have a hemodialysis catheter.  Incision Care  Clean your incision with mild soap and water after 48 hours. Pat the area dry with a clean towel. You do not need a bandage unless otherwise instructed. Do not apply any ointments or creams to your incision. You may have skin glue on your incision. Do not peel it off. It will come off on its own in about one week. Your arm may swell a bit after surgery. To reduce swelling use pillows to elevate your arm so it is above your heart. Your doctor will tell you if you need to lightly wrap your arm with an ACE bandage.  Diet  Resume your normal diet. There are not special food restrictions following this procedure. In order to heal from your surgery, it is CRITICAL to get adequate nutrition. Your body requires vitamins, minerals, and protein. Vegetables are the best source of vitamins and minerals. Vegetables also provide the perfect balance of protein. Processed food has little nutritional value, so try to avoid this.  Medications  Resume taking all of your medications. If your incision is causing pain, you may take over-the counter pain relievers such as acetaminophen (Tylenol). If you were prescribed a stronger pain medication, please be aware these medications can cause nausea and constipation. Prevent  nausea by taking the medication with a snack or meal. Avoid constipation by drinking plenty of fluids and eating foods with high amount of fiber, such as fruits, vegetables, and grains.  Do not take Tylenol if you are taking prescription pain medications.  Follow up Your surgeon may want to see you in the office following your access surgery. If so, this will be arranged at the time of your surgery.  Please call us immediately for any of the following conditions:  . Increased pain, redness, drainage (pus) from your incision site . Fever of 101 degrees or higher . Severe or worsening pain at your incision site . Hand pain or numbness. .  Reduce your risk of vascular disease:  . Stop smoking. If you would like help, call [facility] at 1-800-QUIT-NOW 803-272-0843) or [place] at (605)528-1653  . Manage your cholesterol . Maintain a desired weight . Control your diabetes . Keep your blood pressure down  Dialysis  It will take several weeks to several months for your new dialysis access to be ready for use. Your surgeon will determine when it is okay to use it. Your nephrologist will continue to direct your dialysis. You can continue to use your Permcath until your new access is ready for use.   02/20/2020 [name] [id number] [birth year]  Surgeon(s): Waynetta Sandy, MD  Procedure(s): INSERTION OF ARTERIOVENOUS (AV) GORE-TEX GRAFT ARM REVISON OF ARTERIOVENOUS FISTULA  x Do not stick graft for 4 weeks    If you have any questions, please call the office at [phone].

## 2020-02-20 NOTE — Transfer of Care (Signed)
Immediate Anesthesia Transfer of Care Note  Patient: Lezlie Octave  Procedure(s) Performed: INSERTION OF ARTERIOVENOUS (AV) GORE-TEX GRAFT ARM (Right Arm Upper) REVISON OF ARTERIOVENOUS FISTULA (Right Arm Upper)  Patient Location: PACU  Anesthesia Type:MAC and Regional  Level of Consciousness: drowsy  Airway & Oxygen Therapy: Patient Spontanous Breathing and Patient connected to face mask oxygen  Post-op Assessment: Report given to RN and Post -op Vital signs reviewed and stable  Post vital signs: Reviewed and stable  Last Vitals:  Vitals Value Taken Time  BP 132/74 02/20/20 1005  Temp 36.5 C 02/20/20 1005  Pulse 77 02/20/20 1006  Resp 14 02/20/20 1006  SpO2 100 % 02/20/20 1006  Vitals shown include unvalidated device data.  Last Pain:  Vitals:   02/20/20 0720  TempSrc:   PainSc: 8       Patients Stated Pain Goal: 3 (59/93/57 0177)  Complications: No complications documented.

## 2020-02-20 NOTE — Anesthesia Procedure Notes (Signed)
Anesthesia Regional Block: Supraclavicular block   Pre-Anesthetic Checklist: ,, timeout performed, Correct Patient, Correct Site, Correct Laterality, Correct Procedure, Correct Position, site marked, Risks and benefits discussed,  Surgical consent,  Pre-op evaluation,  At surgeon's request and post-op pain management  Laterality: Right  Prep: chloraprep       Needles:  Injection technique: Single-shot  Needle Type: Echogenic Stimulator Needle     Needle Length: 10cm  Needle Gauge: 20     Additional Needles:   Procedures:,,,, ultrasound used (permanent image in chart),,,,  Narrative:  Start time: 02/20/2020 8:15 AM End time: 02/20/2020 8:25 AM Injection made incrementally with aspirations every 5 mL.  Performed by: Personally  Anesthesiologist: Murvin Natal, MD  Additional Notes: Functioning IV was confirmed and monitors were applied.  A timeout was performed. Sterile prep, hand hygiene and sterile gloves were used. A 180mm 20ga BBraun echogenic stimulator needle was used. Negative aspiration and negative test dose prior to incremental administration of local anesthetic. The patient tolerated the procedure well.  Ultrasound guidance: relevent anatomy identified, needle position confirmed, local anesthetic spread visualized around nerve(s), vascular puncture avoided.  Image printed for medical record.

## 2020-02-21 ENCOUNTER — Encounter (HOSPITAL_COMMUNITY): Payer: Self-pay | Admitting: Vascular Surgery

## 2020-02-21 IMAGING — DX DG CHEST 1V PORT
1 series · 2 of 2 positions shown · non-contrast
Comparison: 09/18/2018.  09/08/2019

CLINICAL DATA: Endotracheal tube placement

EXAM:
PORTABLE CHEST 1 VIEW

[Series 1: chest ap · 0.14mm/px · 2 of 2 slices shown]
[im 1/2]
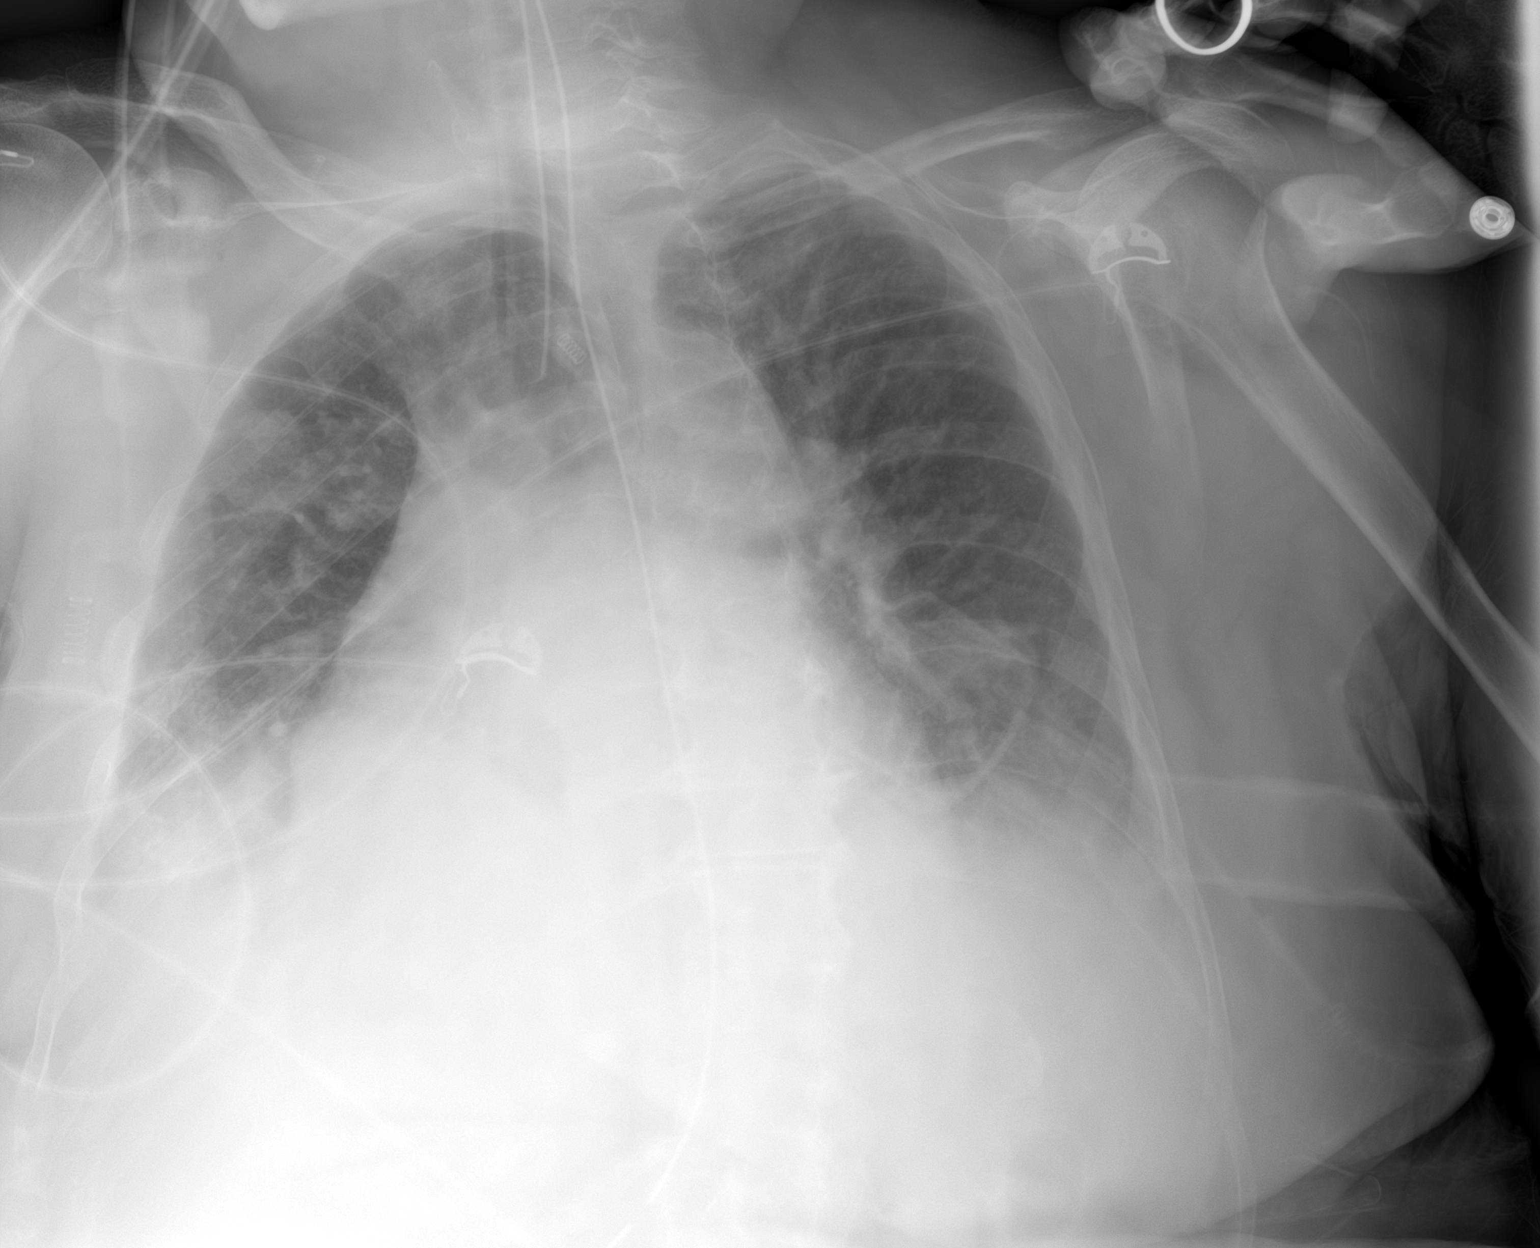
[im 2/2]
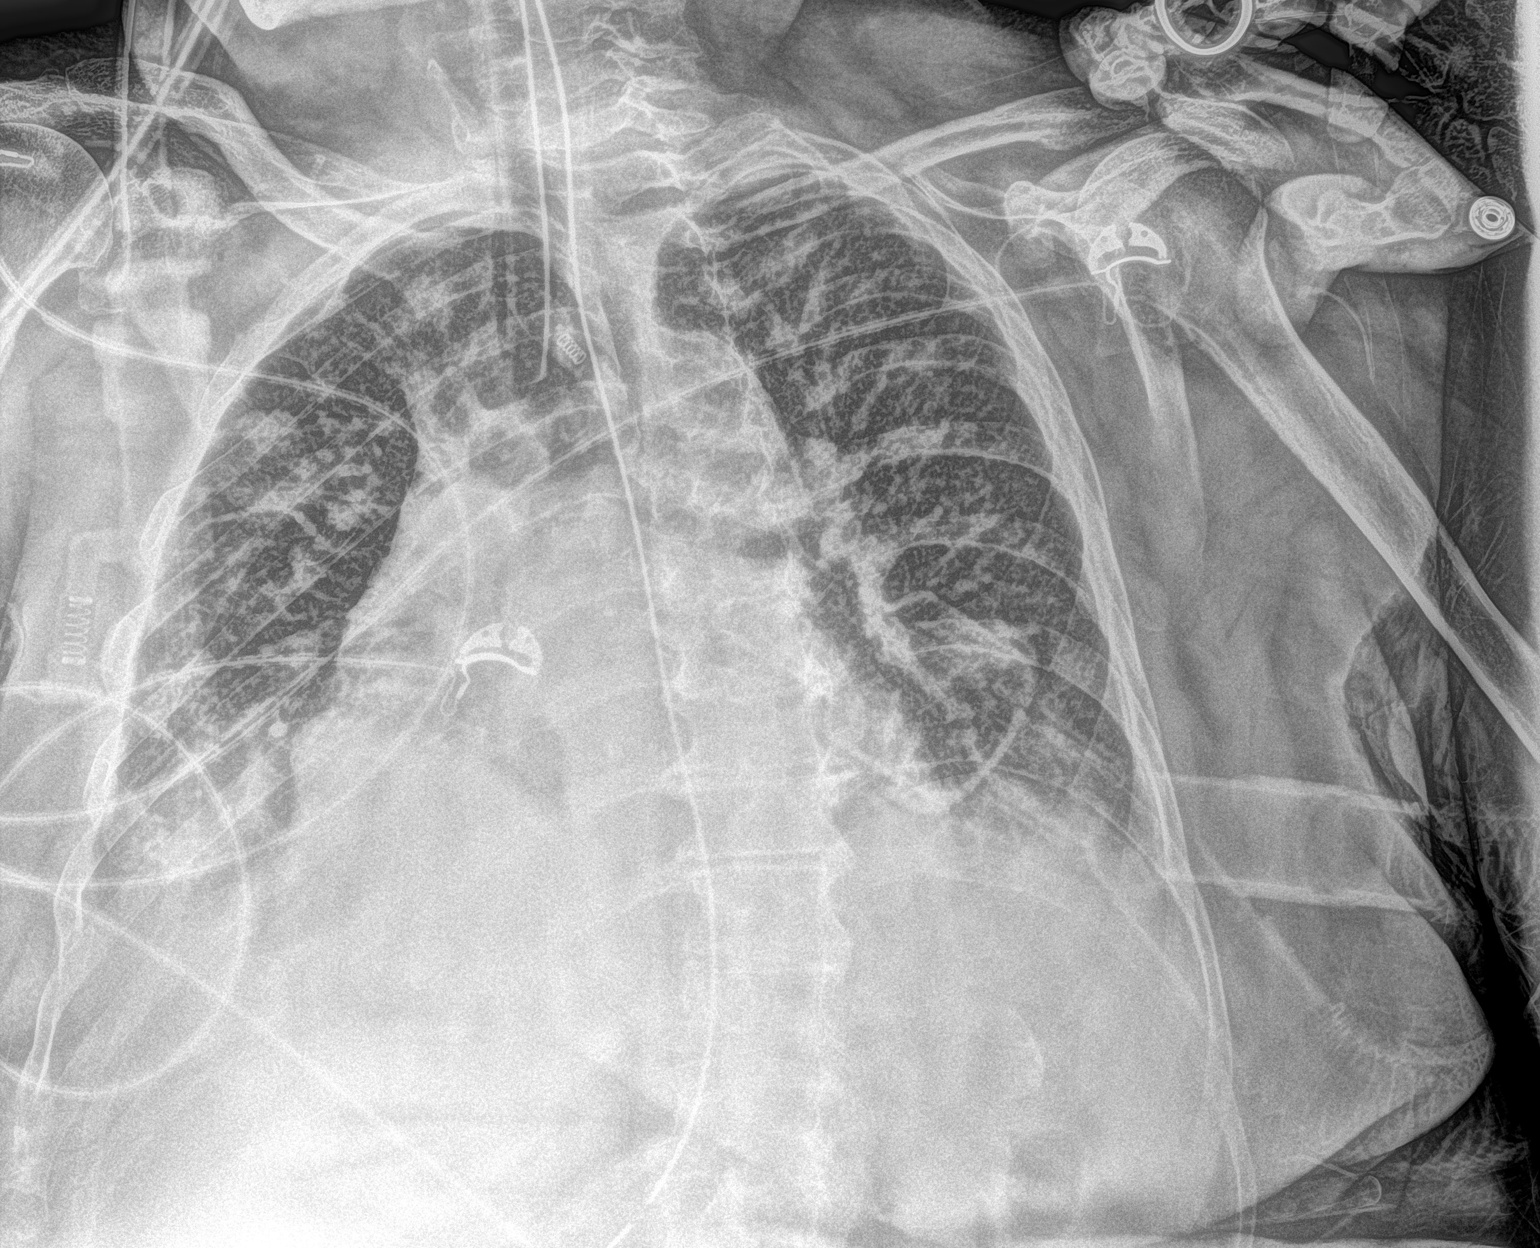

[2 of 2 positions shown; findings below may reference images not displayed]

FINDINGS: The heart size remains enlarged. Endotracheal tube terminates
approximately 3.7 cm above the carina. The enteric tube extends
below the left hemidiaphragm. Moderate to large bilateral pleural
effusions are noted. Bibasilar airspace disease is noted which is
favored to represent atelectasis. There is no pneumothorax. Vascular
congestion is noted.
IMPRESSION: 1. Lines and tubes as above.
2. Persistent findings of congestive heart failure with moderate to
large bilateral pleural effusions.

## 2020-02-21 IMAGING — CT CT HEAD W/O CM
2 of 3 series · 14 of 37 positions shown, 17 images · non-contrast
Comparison: September 15, 2018

CLINICAL DATA: Altered mental status.

EXAM:
CT HEAD WITHOUT CONTRAST
TECHNIQUE: Contiguous axial images were obtained from the base of the skull
through the vertex without intravenous contrast.

[Series 5: sagittal soft · sagittal · 0.35mm/px · 3 of 55 slices shown]
[im 19/55  brain]
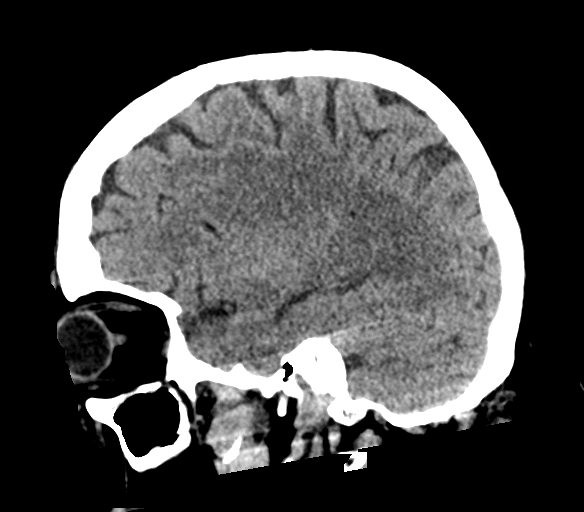
[im 28/55  brain]
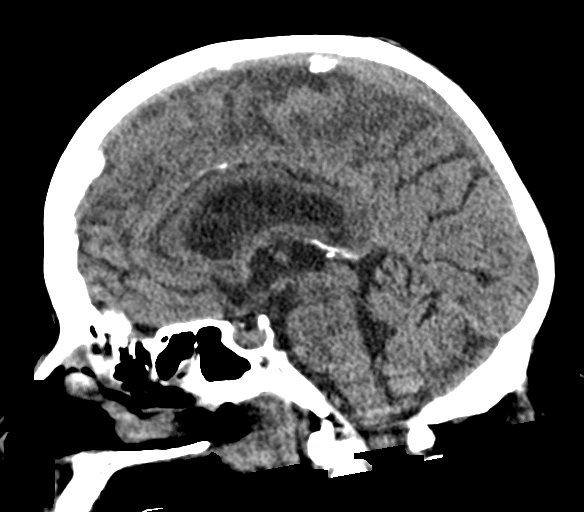
[im 37/55  brain]
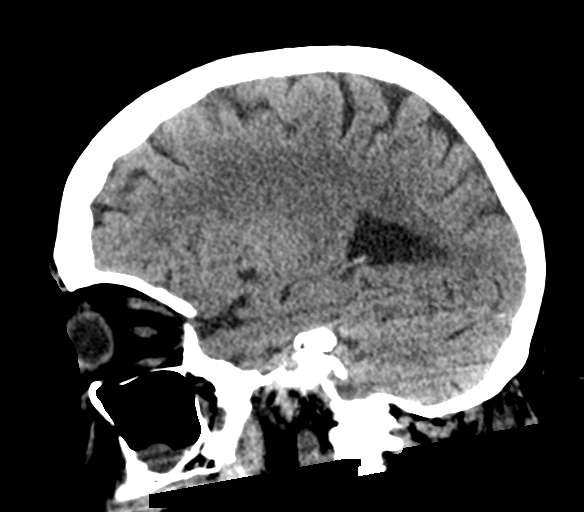

[Series 6: head ax w o · axial · 0.34mm/px · z∈[+29,+178]mm · 11 of 37 slices shown, 14 images]
[im 3/37  brain]
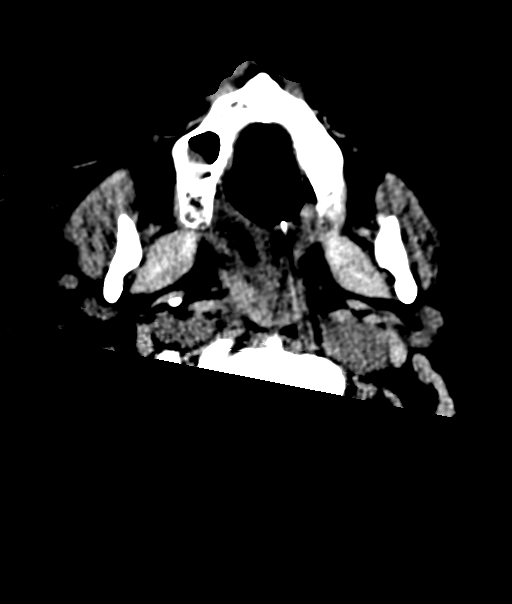
[im 3/37  bone]
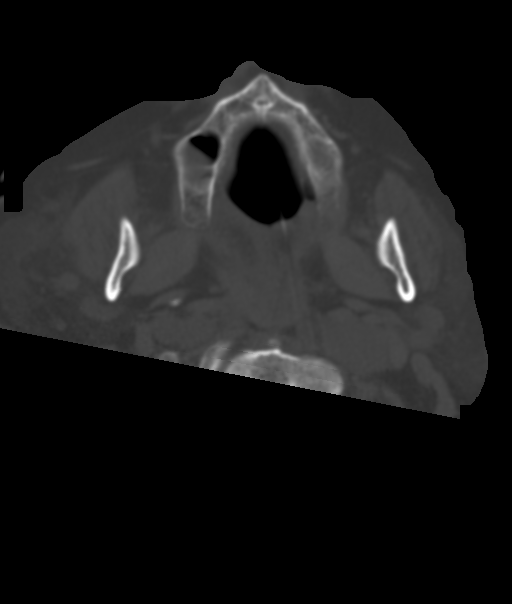
[im 5/37  brain]
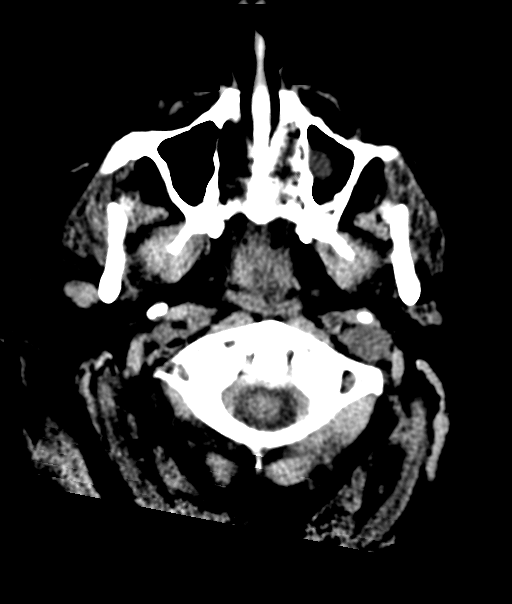
[im 10/37  brain]
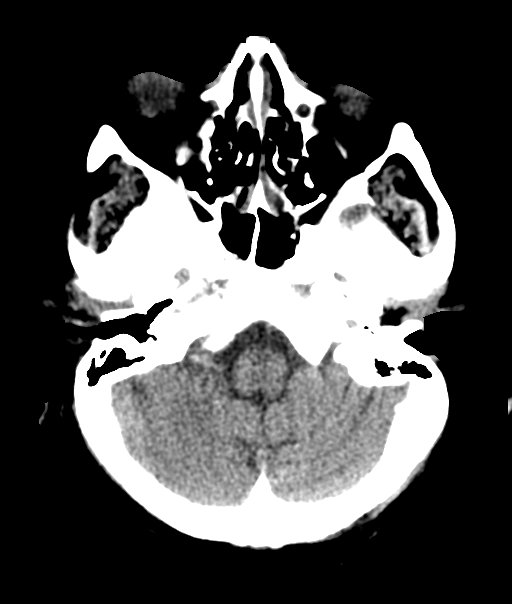
[im 13/37  brain]
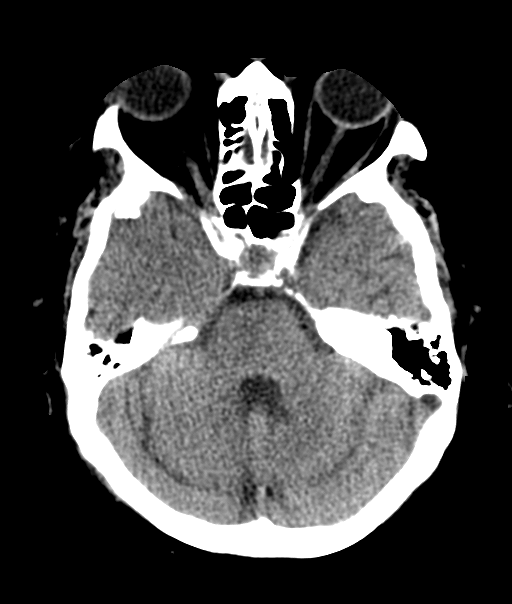
[im 15/37  brain]
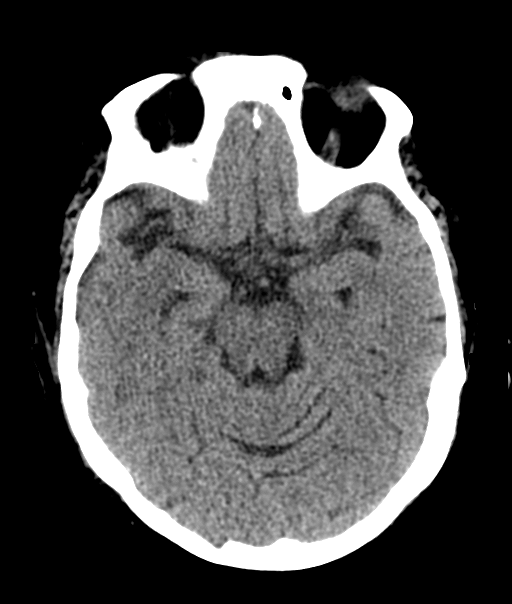
[im 15/37  bone]
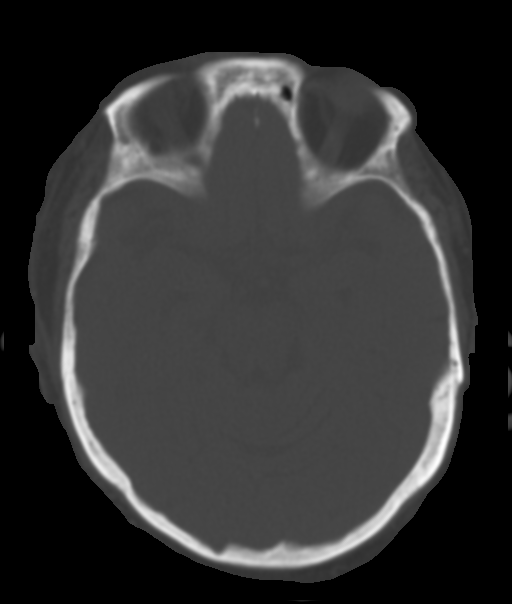
[im 20/37  brain]
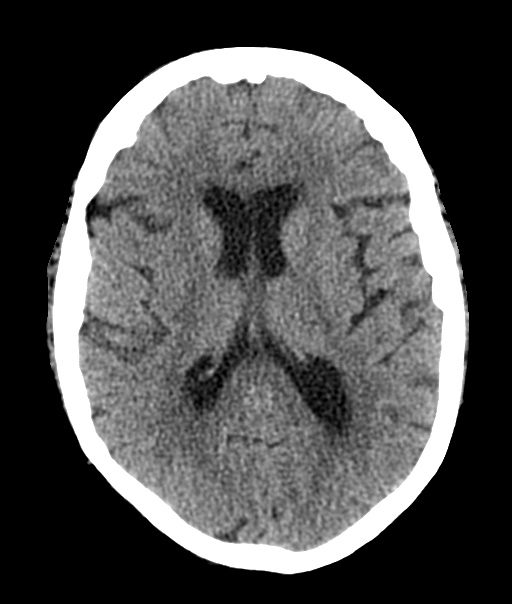
[im 22/37  brain]
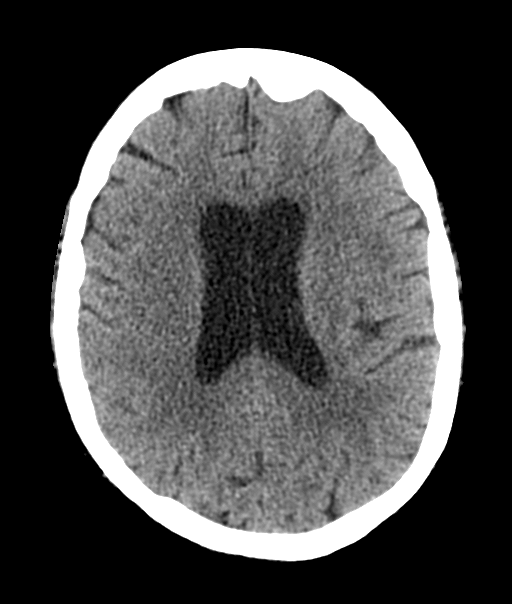
[im 25/37  brain]
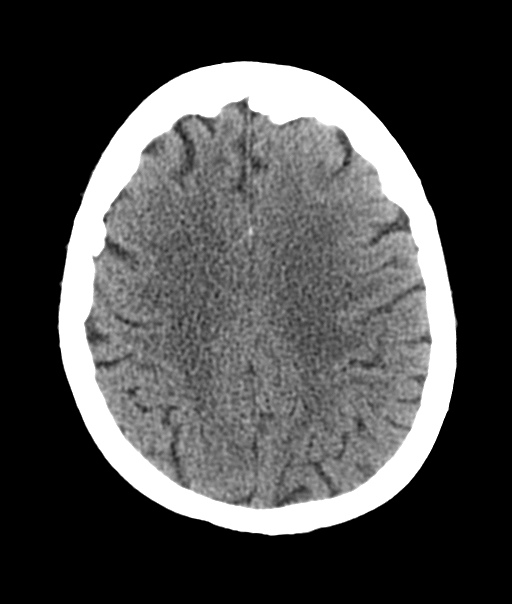
[im 27/37  brain]
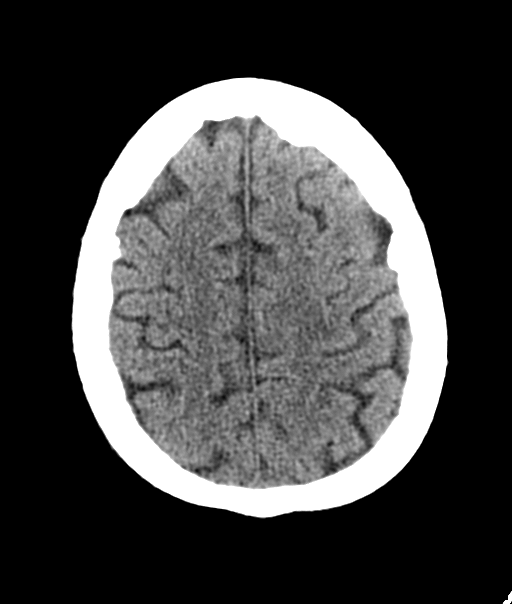
[im 27/37  bone]
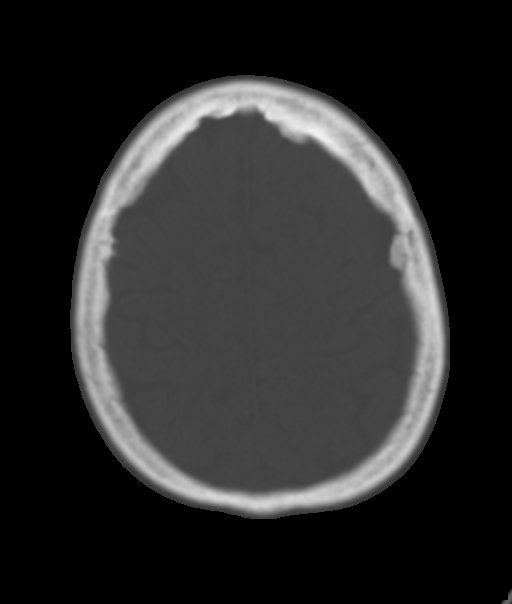
[im 32/37  brain]
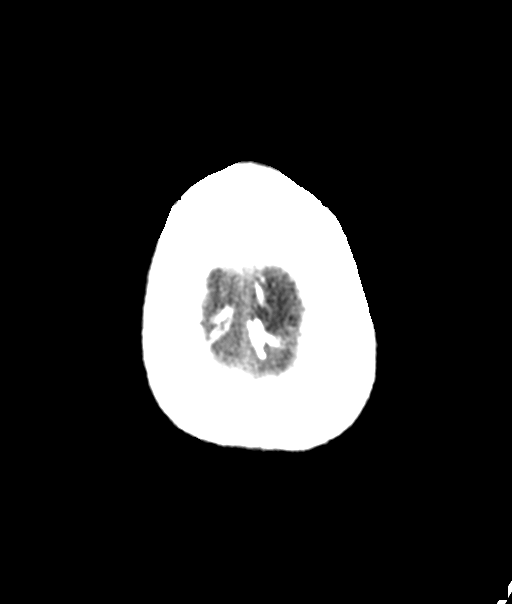
[im 34/37  brain]
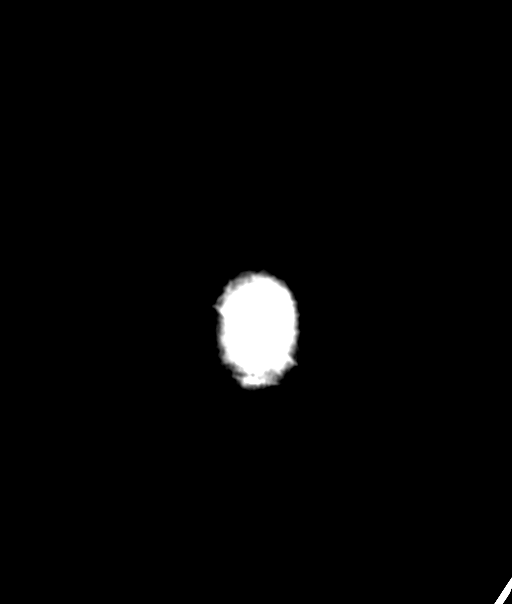

[14 of 37 positions shown; findings below may reference images not displayed]

FINDINGS: Brain: There is mild cerebral atrophy with widening of the
extra-axial spaces and ventricular dilatation.
There are areas of decreased attenuation within the white matter
tracts of the supratentorial brain, consistent with microvascular
disease changes.

A chronic left basal ganglia lacunar infarct is noted.

Vascular: No hyperdense vessel or unexpected calcification.

Skull: Normal. Negative for fracture or focal lesion.

Sinuses/Orbits: A stable 2.0 cm x 1.4 cm polyp versus mucous
retention cyst is seen within the anterior aspect of the left
maxillary sinus.

Other: None.
IMPRESSION: 1. Generalized cerebral atrophy.
2. No acute intracranial abnormality.
3. Chronic left basal ganglia lacunar infarct.
4. Stable left maxillary sinus polyp versus mucous retention cyst.

## 2020-02-21 IMAGING — CT CT ANGIO CHEST
2 of 6 series · 17 of 46 positions shown · IV contrast (Omnipaque or Isovue)
Comparison: January 12, 2018

CLINICAL DATA: Severe hypoxia.

EXAM:
CT ANGIOGRAPHY CHEST WITH CONTRAST
TECHNIQUE: Multidetector CT imaging of the chest was performed using the
standard protocol during bolus administration of intravenous
contrast. Multiplanar CT image reconstructions and MIPs were
obtained to evaluate the vascular anatomy.
CONTRAST:  100mL OMNIPAQUE IOHEXOL 300 MG/ML  SOLN

[Series 6: pe axial thins · axial · 0.75mm/px · z∈[+1282,+1510]mm · 14 of 250 slices shown]
[im 11/250  lung]
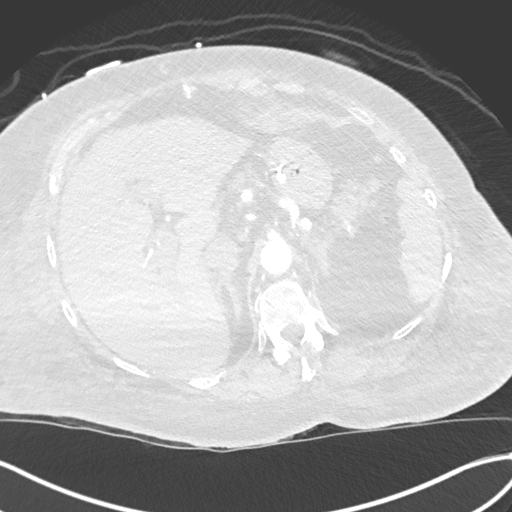
[im 33/250  soft-tissue]
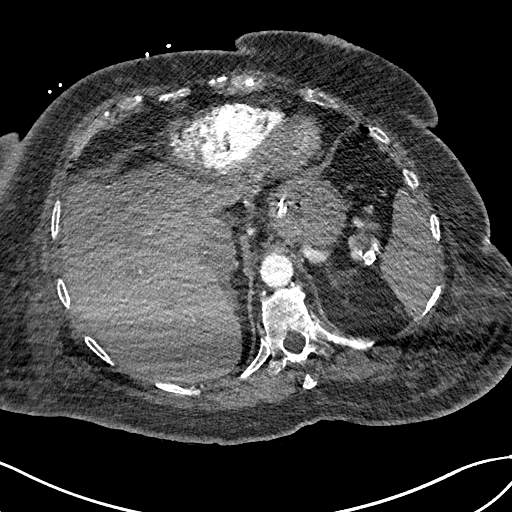
[im 44/250  lung]
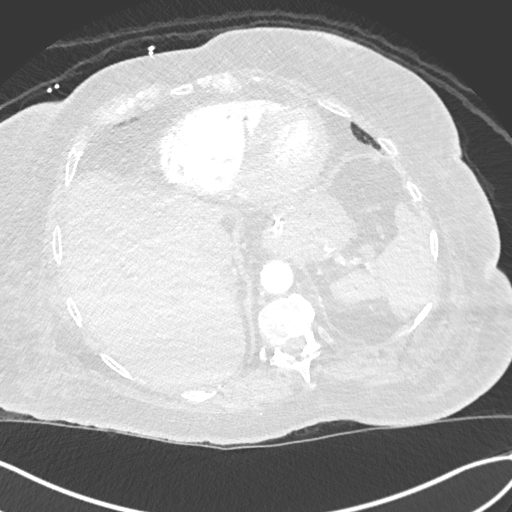
[im 65/250  soft-tissue]
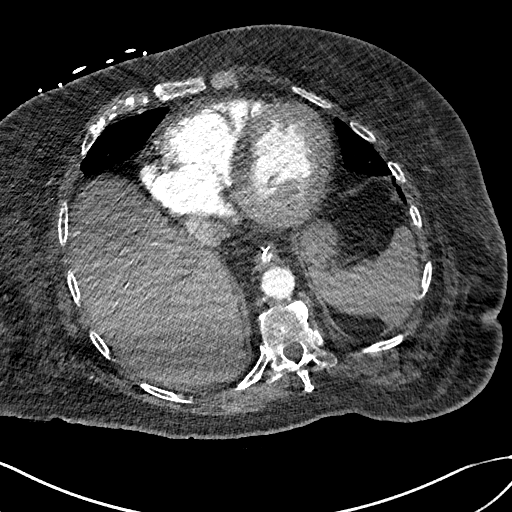
[im 87/250  lung]
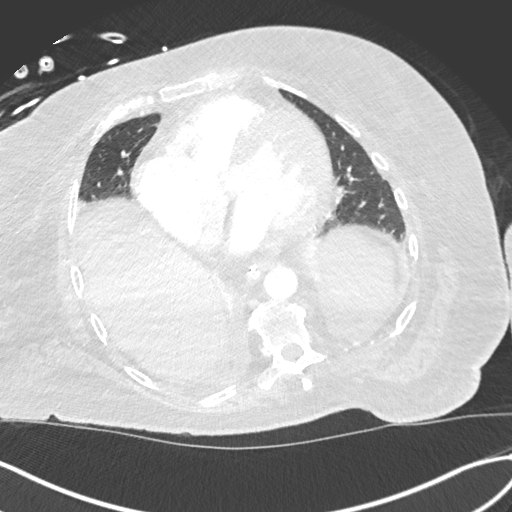
[im 98/250  soft-tissue]
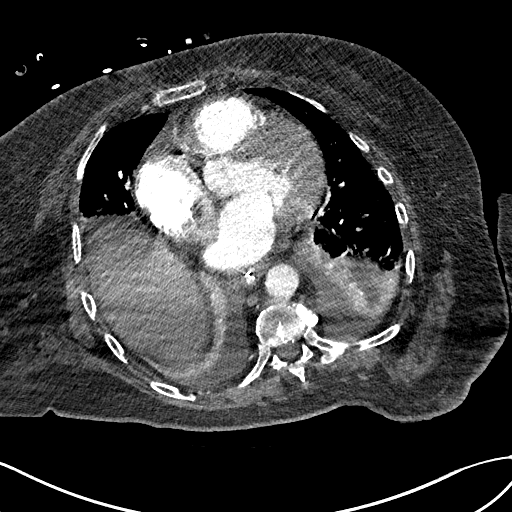
[im 120/250  lung]
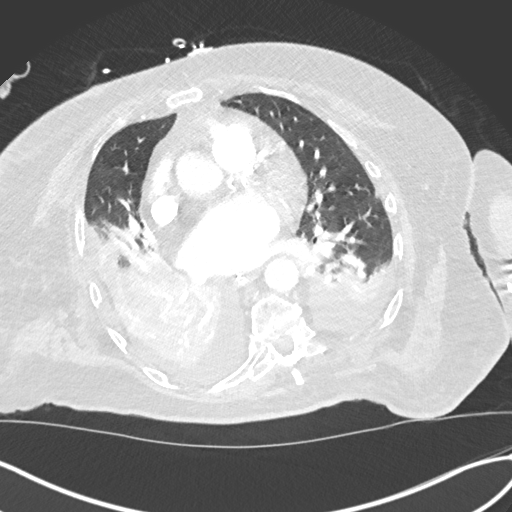
[im 130/250  soft-tissue]
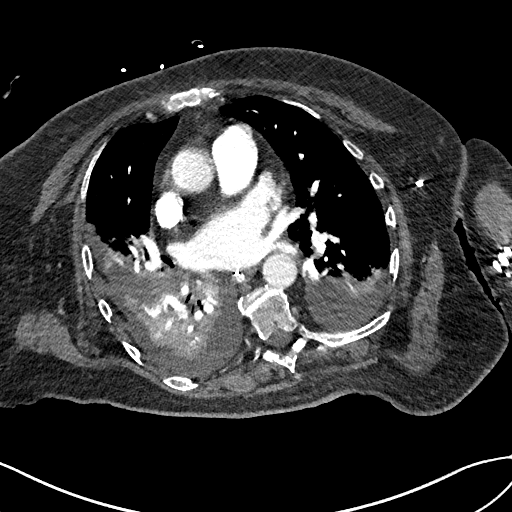
[im 152/250  lung]
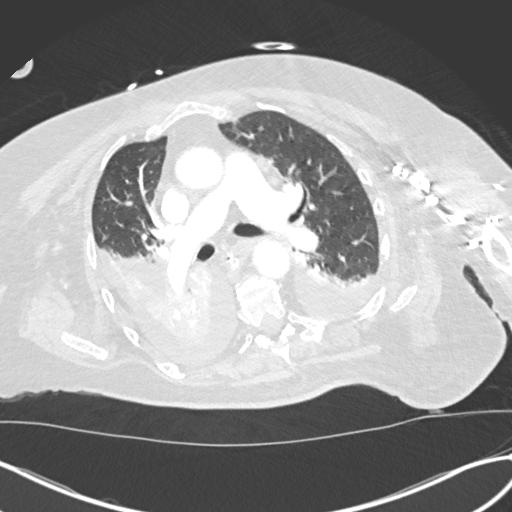
[im 163/250  soft-tissue]
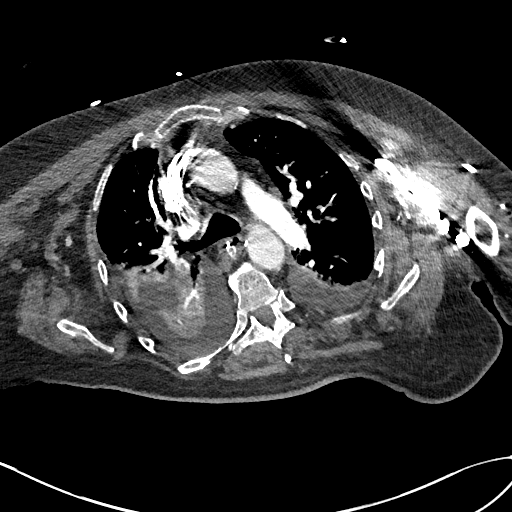
[im 185/250  lung]
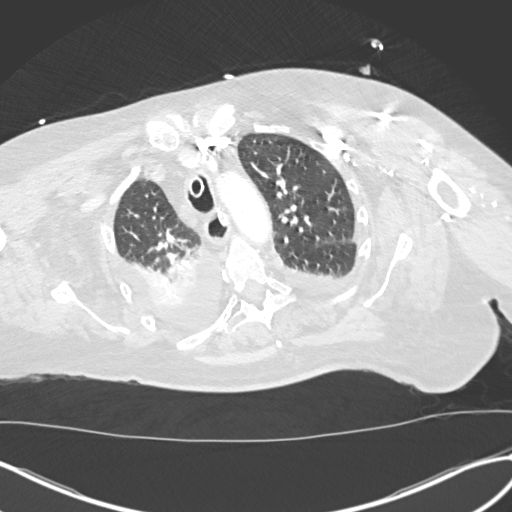
[im 206/250  soft-tissue]
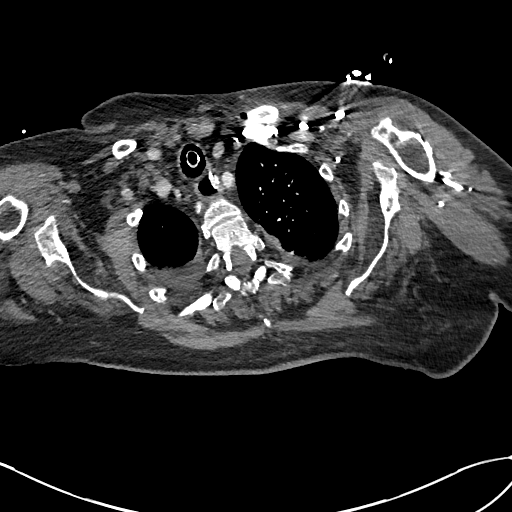
[im 217/250  lung]
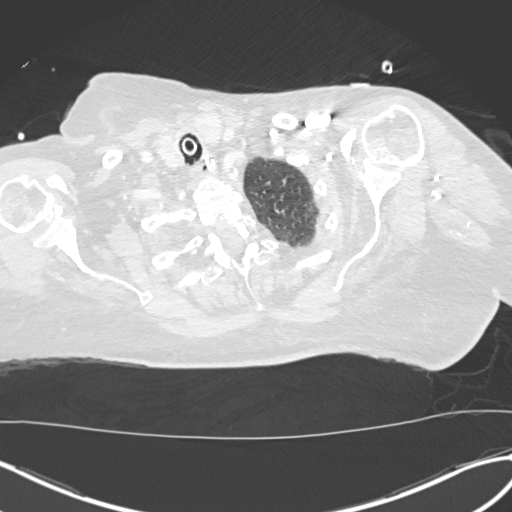
[im 239/250  soft-tissue]
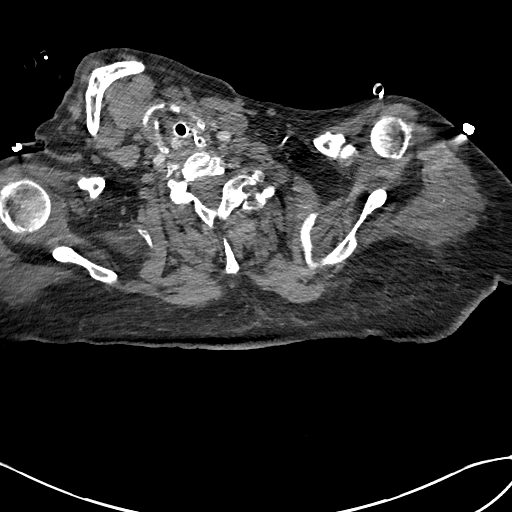

[Series 8: cor soft · coronal · 0.57mm/px · 3 of 182 slices shown]
[im 46/182  soft-tissue]
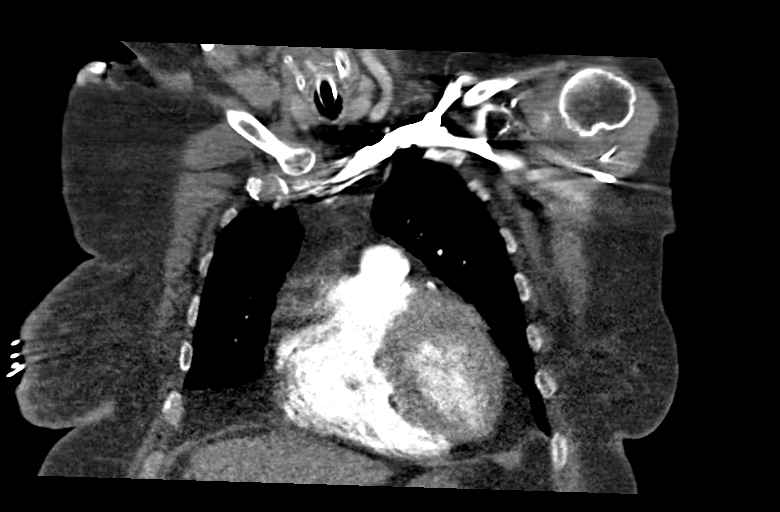
[im 91/182  soft-tissue]
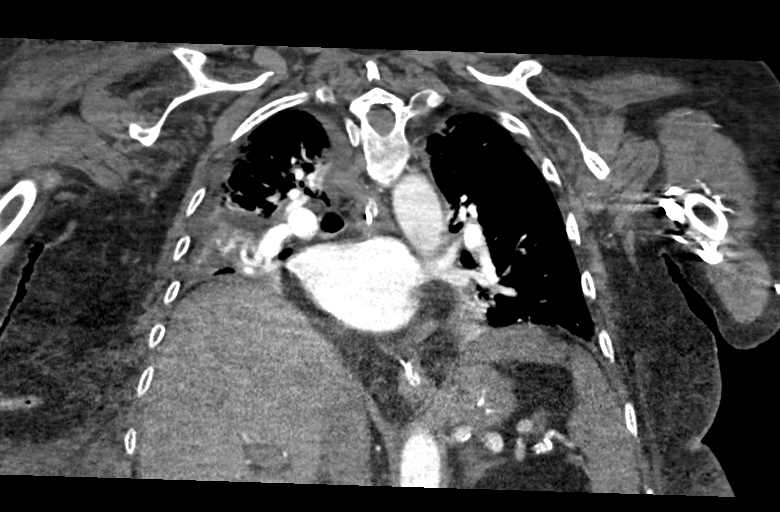
[im 136/182  soft-tissue]
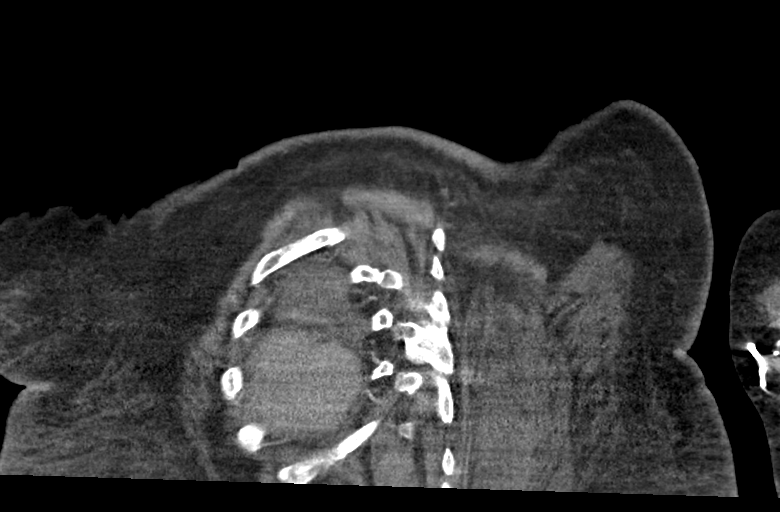

[17 of 46 positions shown; findings below may reference images not displayed]

FINDINGS: Cardiovascular: There is mild calcification of the aortic arch.
Satisfactory opacification of the pulmonary arteries to the
segmental level. No evidence of pulmonary embolism. There is mild,
stable cardiomegaly. No pericardial effusion. Marked severity
coronary artery calcification is seen.

Mediastinum/Nodes: No enlarged mediastinal, hilar, or axillary lymph
nodes. Thyroid gland, trachea, and esophagus demonstrate no
significant findings.

Lungs/Pleura: Endotracheal and nasogastric tubes are present.

Moderate severity atelectasis and/or infiltrate is seen within the
posterior aspect of the left upper lobe and left lower lobe with
marked severity consolidation seen throughout the entire right lower
lobe.

Moderate size bilateral pleural effusions are seen, right greater
than left.

No pneumothorax is seen.

Upper Abdomen: No acute abnormality.

Musculoskeletal: Multilevel degenerative changes seen throughout the
thoracic spine

Review of the MIP images confirms the above findings.
IMPRESSION: 1. No CT evidence of acute pulmonary embolism.
2. Moderate severity left upper lobe and right lower lobe
atelectasis and/or infiltrate with marked severity consolidation
seen throughout the entire right lower lobe.
3. Moderate size bilateral pleural effusions, right greater than
left.

## 2020-02-21 IMAGING — DX DG CHEST 1V PORT
1 series · 2 of 2 positions shown · non-contrast
Comparison: September 06, 2019.

CLINICAL DATA: Status post intubation.

EXAM:
PORTABLE CHEST 1 VIEW

[Series 3: chest ap · 0.14mm/px · 2 of 2 slices shown]
[im 1/2]
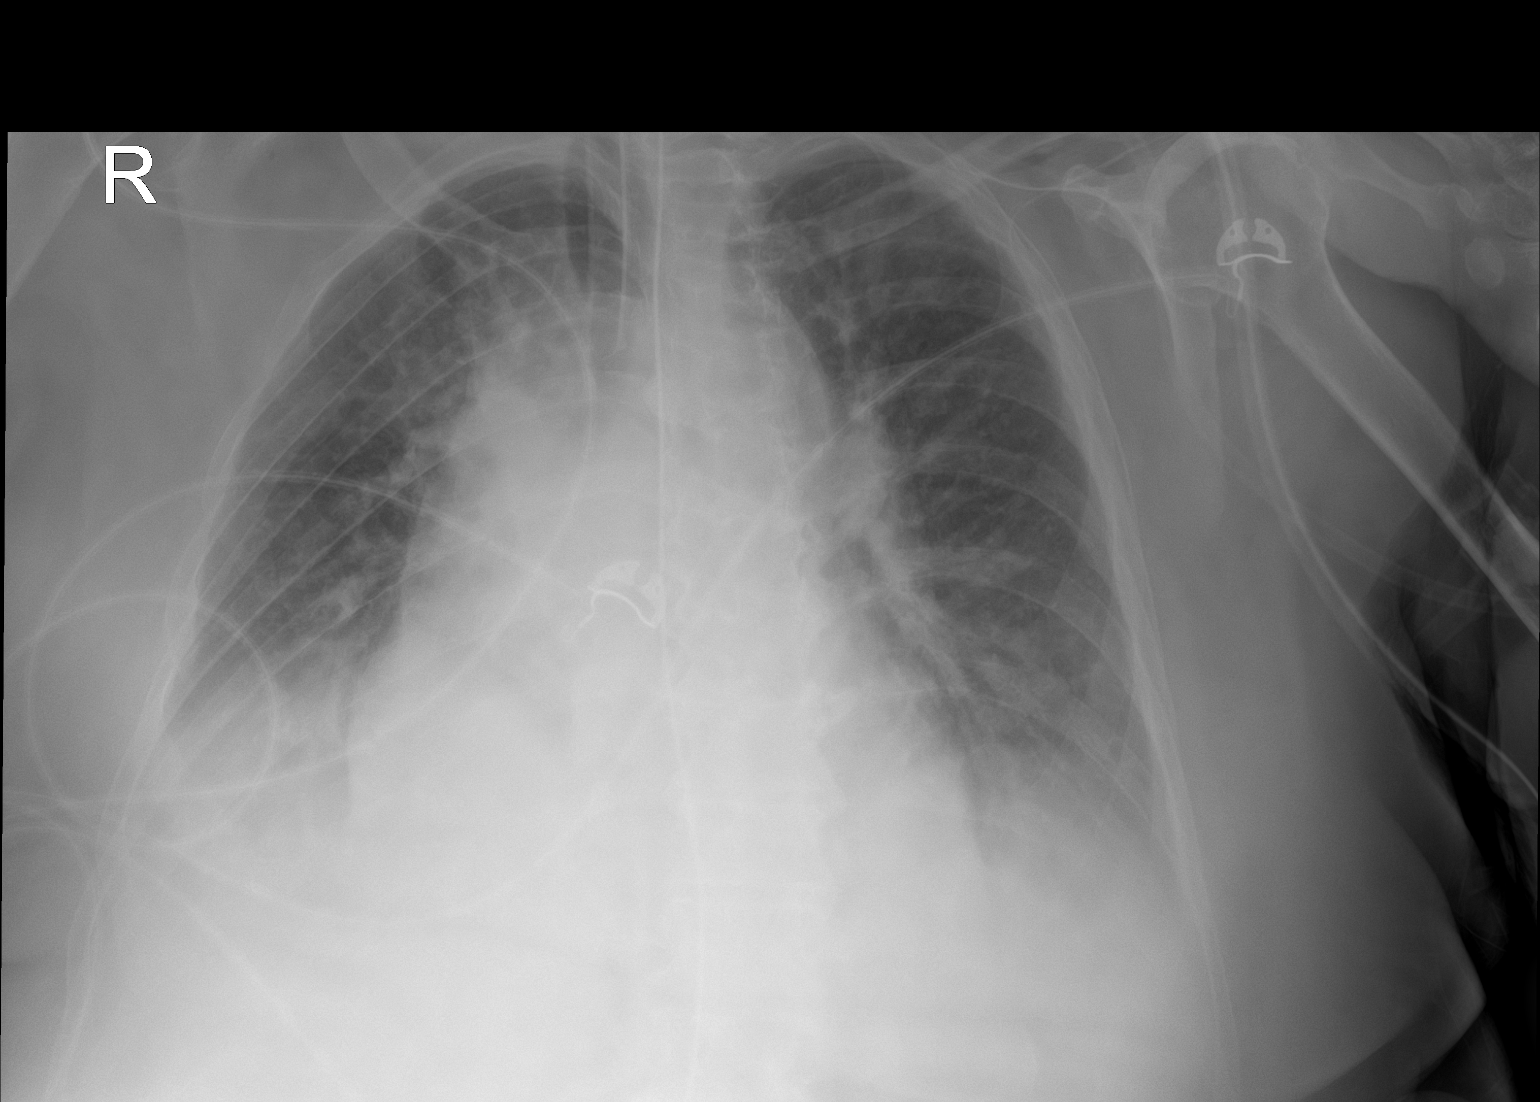
[im 2/2]
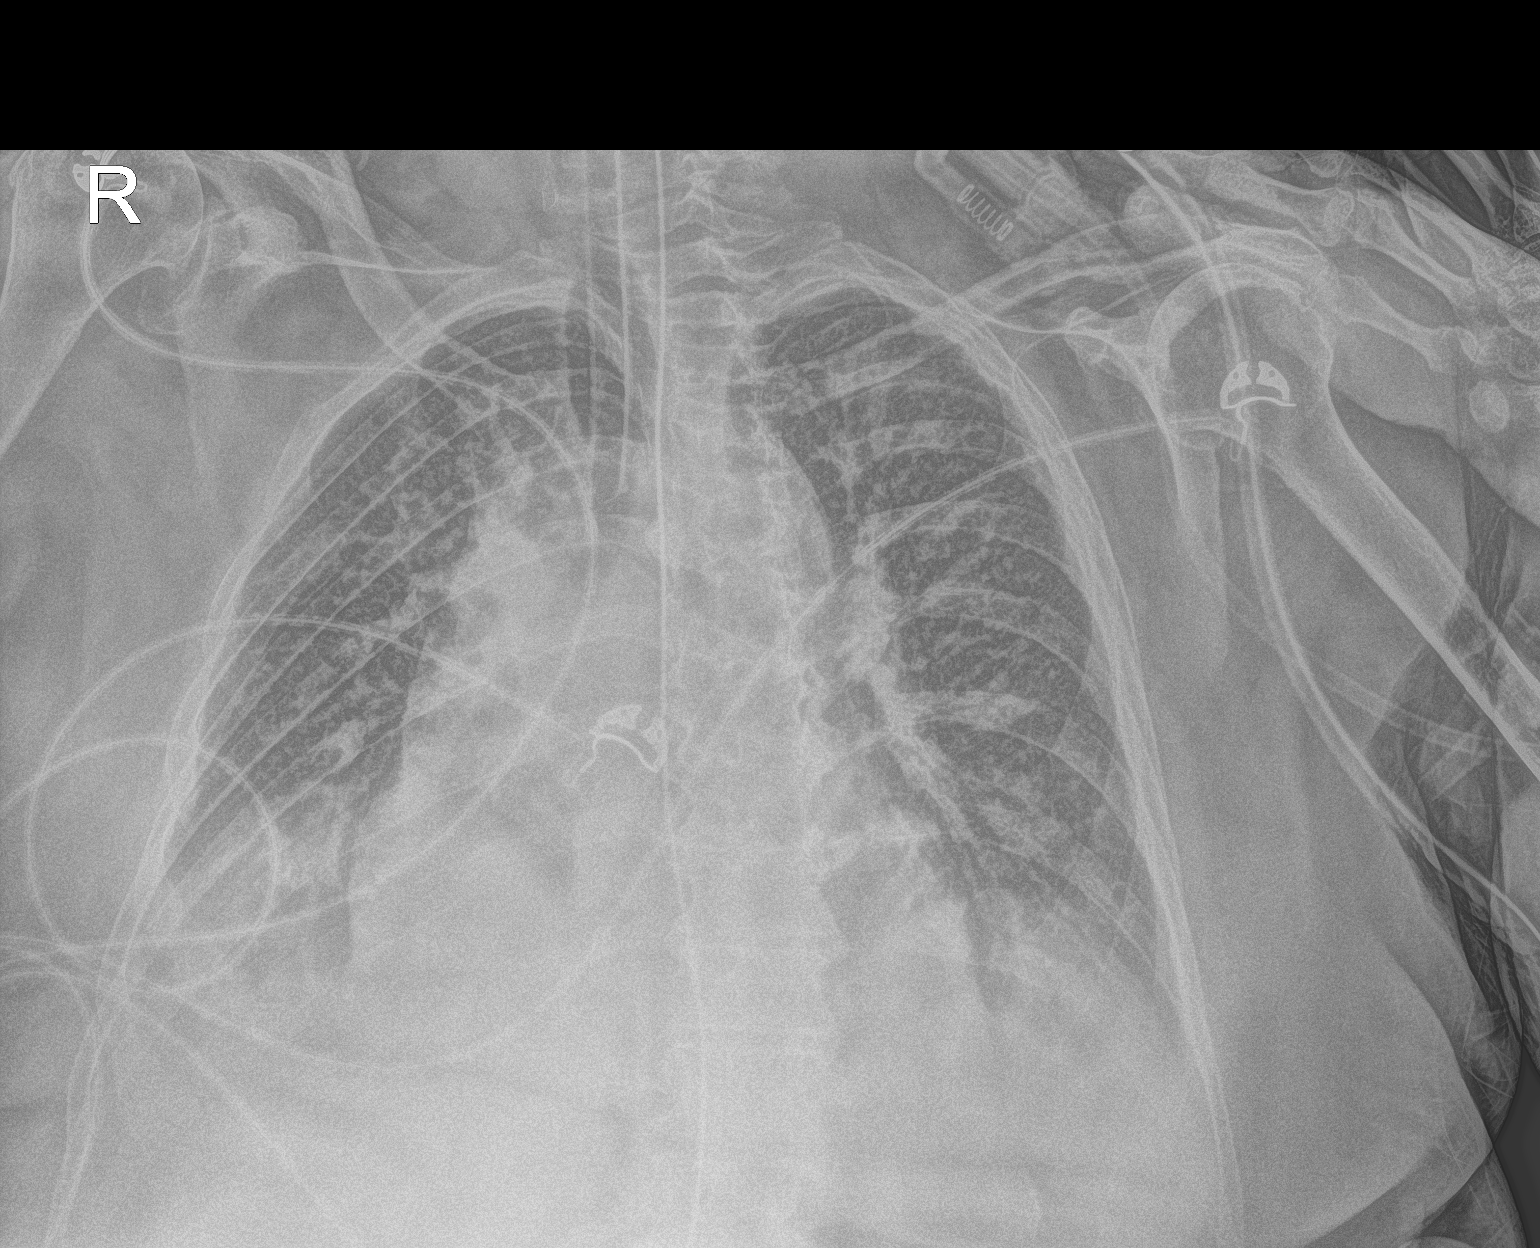

[2 of 2 positions shown; findings below may reference images not displayed]

FINDINGS: An endotracheal tube is seen with its distal tip approximately
cm from the carina. This represents a new finding when compared to
the prior study.

A nasogastric tube is seen with its distal end extending below the
level of the diaphragm. This also represents a new finding.

There is no evidence of focal consolidation, pleural effusion or
pneumothorax.

The cardiac silhouette is moderately enlarged.

Multilevel degenerative changes seen throughout the thoracic spine.
IMPRESSION: 1. Interval endotracheal tube and nasogastric tube placement and
positioning, as described above, when compared to the prior study
dated September 06, 2019.

## 2020-02-23 IMAGING — DX DG CHEST 1V PORT
1 series · 1 of 1 positions shown · non-contrast
Comparison: Yesterday

CLINICAL DATA: Acute respiratory failure with hypoxia

EXAM:
PORTABLE CHEST 1 VIEW

[chest ap]
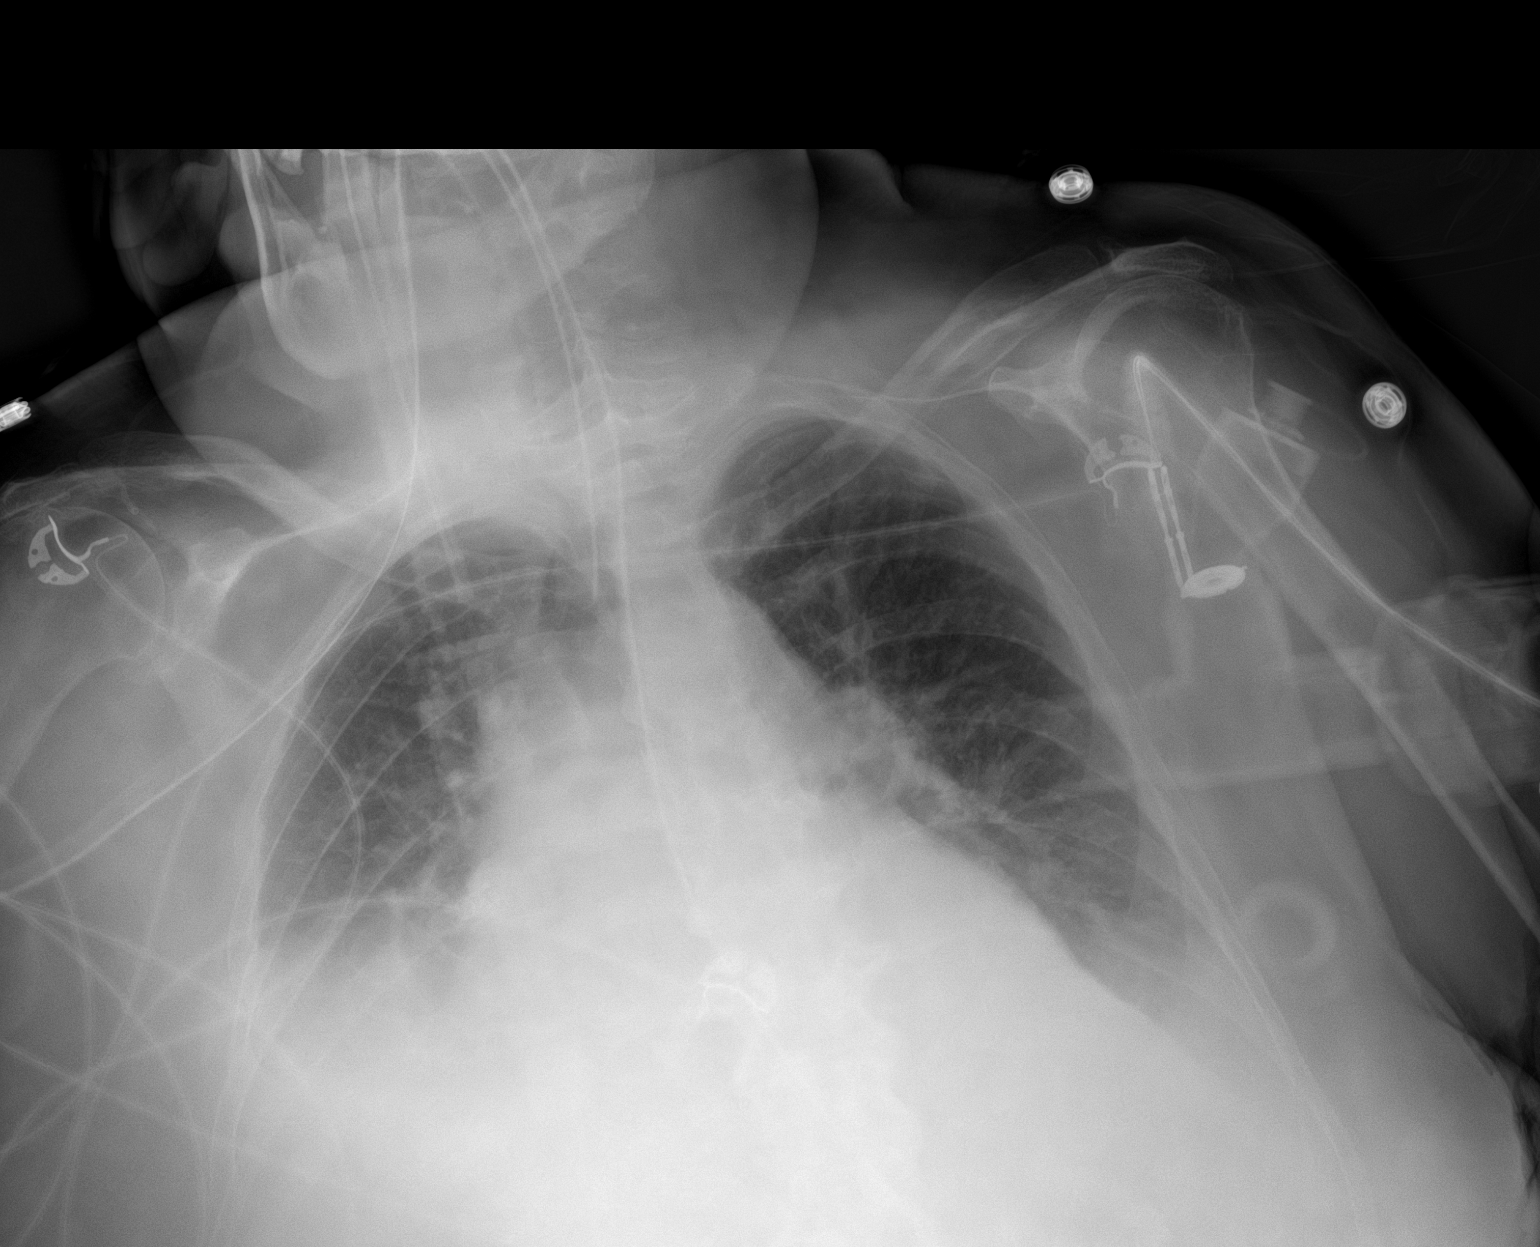

[1 of 1 positions shown; findings below may reference images not displayed]

FINDINGS: Small pleural effusions with superimposed lower lobe atelectasis by
most recent chest CT. Cardiomegaly. Endotracheal tube tip is at the
clavicular heads, slightly higher than before. The enteric tube at
least reaches the stomach.
IMPRESSION: Continued low volume chest with layering effusions and lower lobe
atelectasis.

## 2020-02-24 IMAGING — CT CT HEAD W/O CM
3 of 4 series · 15 of 47 positions shown, 18 images · non-contrast
Comparison: Head CT 09/08/2019.  Brain MRI 09/27/2018.

CLINICAL DATA: Onset left-side neglect today.

EXAM:
CT HEAD WITHOUT CONTRAST
TECHNIQUE: Contiguous axial images were obtained from the base of the skull
through the vertex without intravenous contrast.

[Series 2: head w o · axial · 0.40mm/px · z∈[+58,+183]mm · 9 of 31 slices shown, 12 images]
[im 3/31  brain]
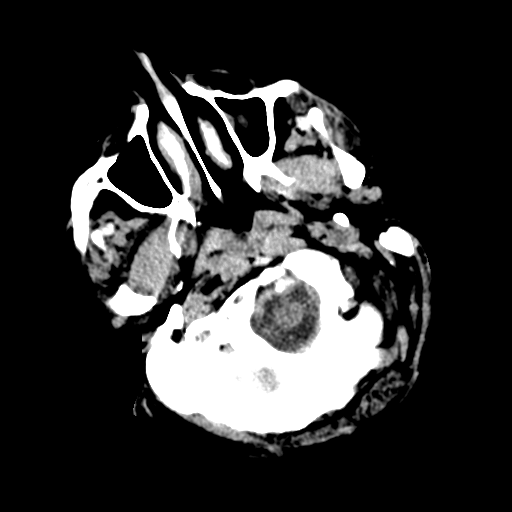
[im 3/31  bone]
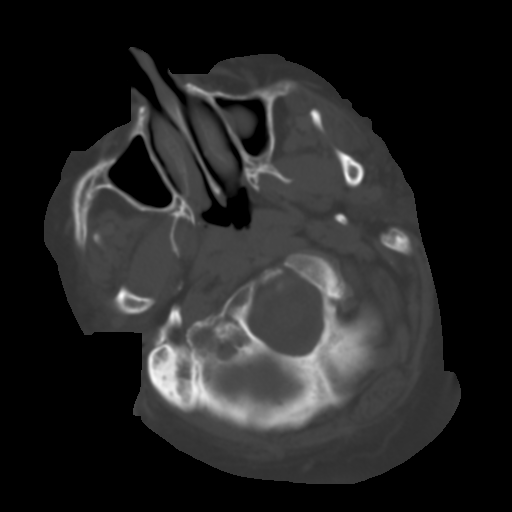
[im 7/31  brain]
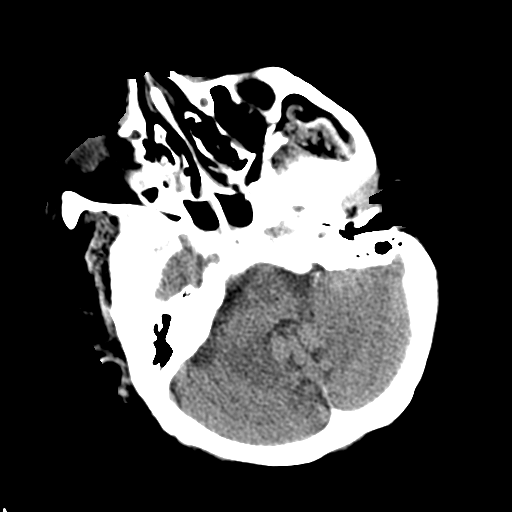
[im 9/31  brain]
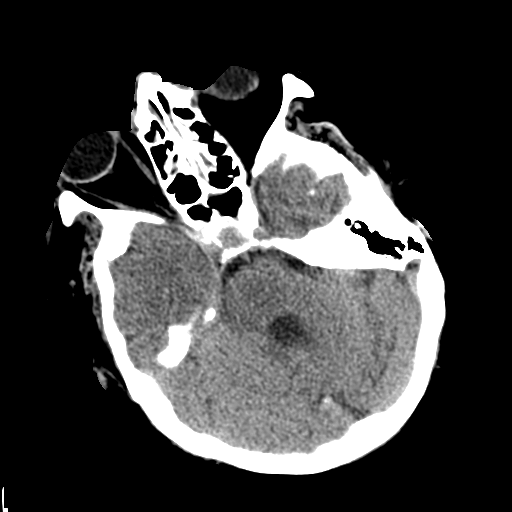
[im 13/31  brain]
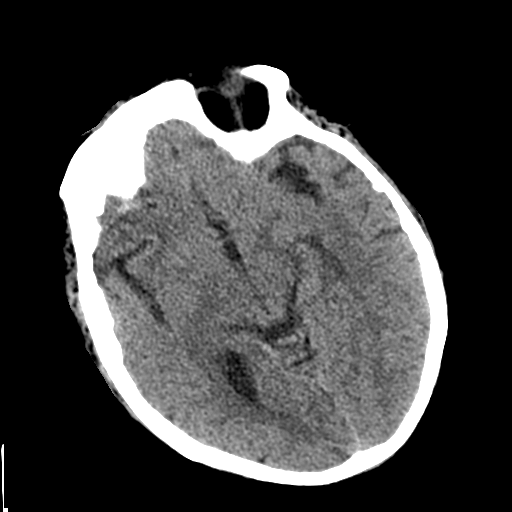
[im 16/31  brain]
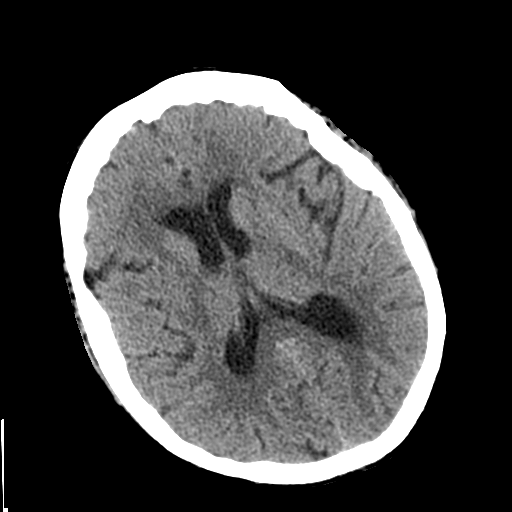
[im 16/31  bone]
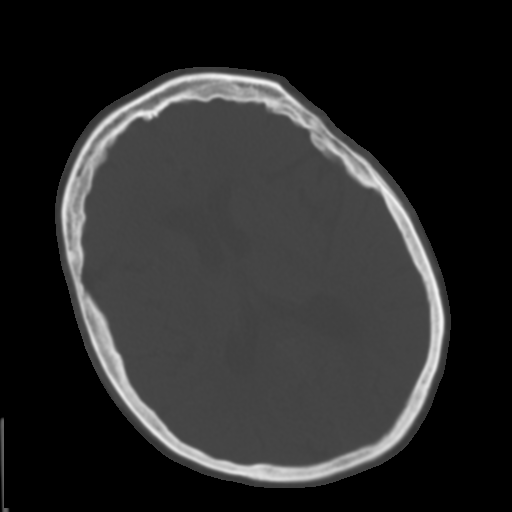
[im 18/31  brain]
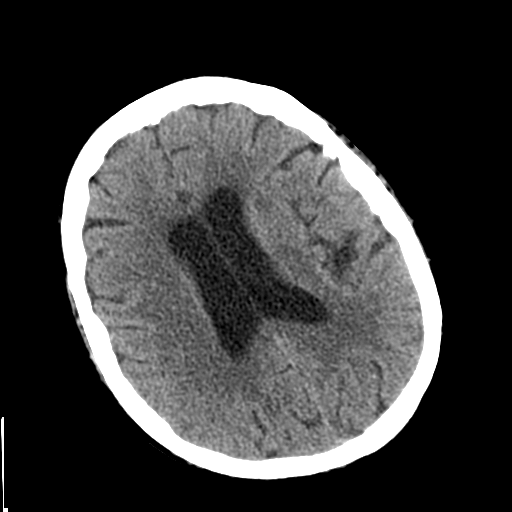
[im 22/31  brain]
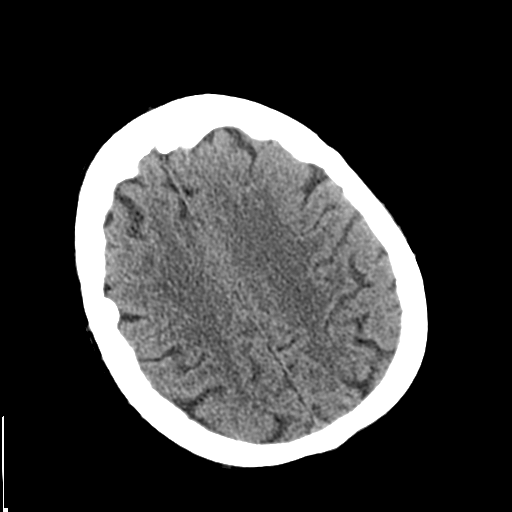
[im 24/31  brain]
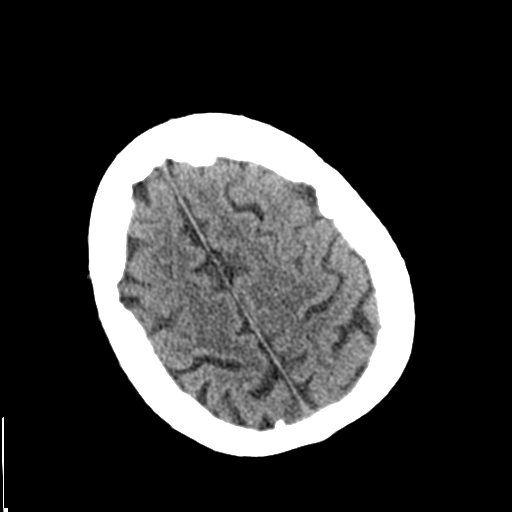
[im 28/31  brain]
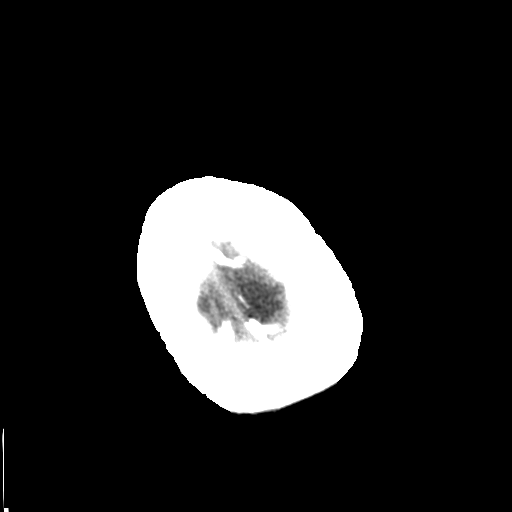
[im 28/31  bone]
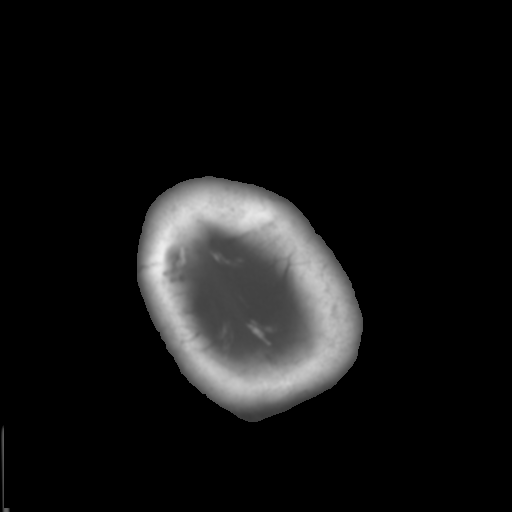

[Series 5: coronal soft · coronal · 0.31mm/px · 3 of 72 slices shown]
[im 24/72  brain]
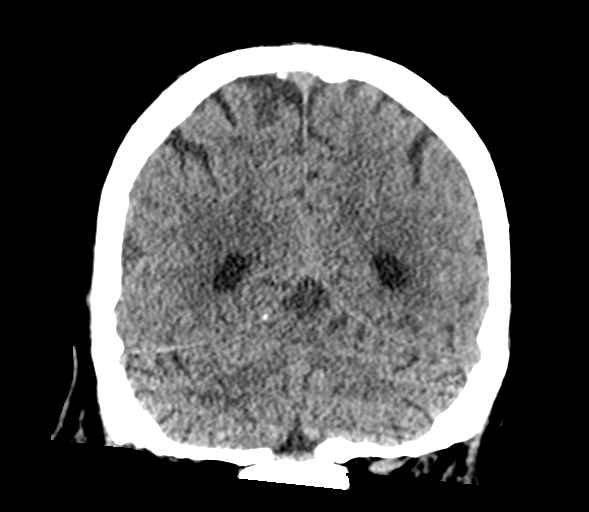
[im 32/72  brain]
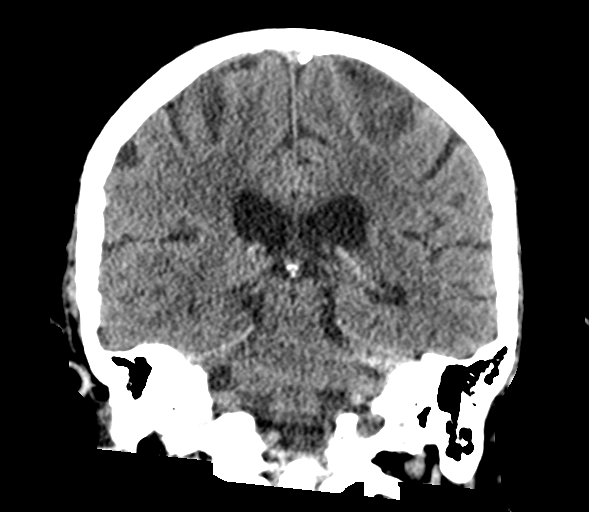
[im 40/72  brain]
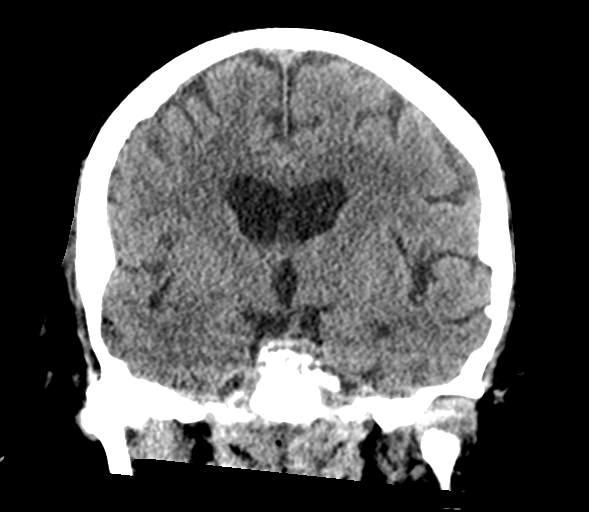

[Series 6: sagittal soft · sagittal · 0.31mm/px · 3 of 62 slices shown]
[im 23/62  brain]
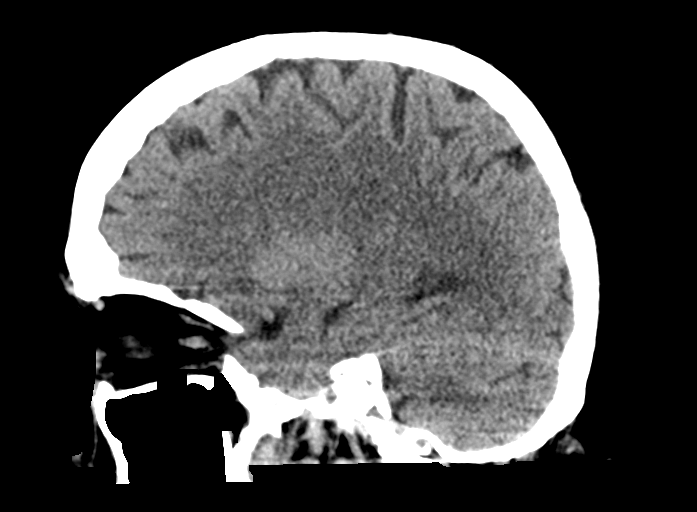
[im 31/62  brain]
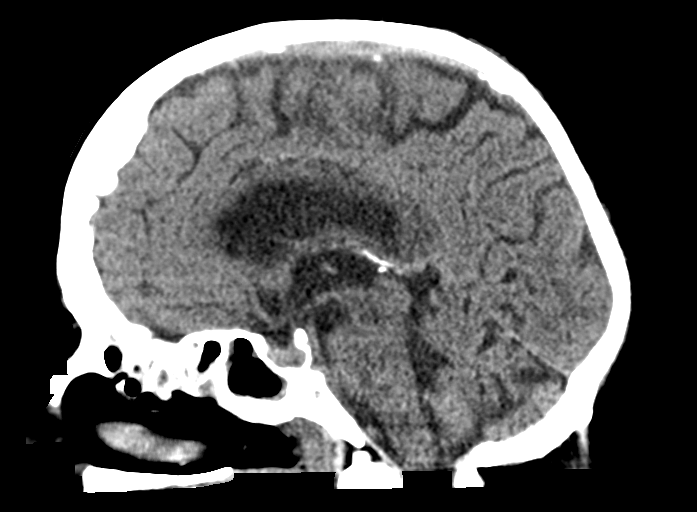
[im 39/62  brain]
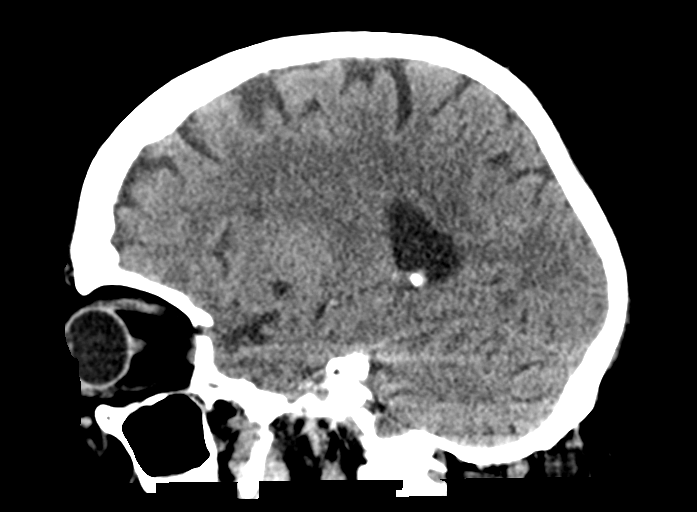

[15 of 47 positions shown; findings below may reference images not displayed]

FINDINGS: Brain: No evidence of acute infarction, hemorrhage, hydrocephalus,
extra-axial collection or mass lesion/mass effect.

Vascular: No hyperdense vessel.  Atherosclerosis noted.

Skull: Intact.  No focal lesion.

Sinuses/Orbits: Small right mastoid effusion is seen. Mucous
retention cyst or polyp left maxillary sinus noted. Mild mucosal
thickening scattered ethmoid air cells, sphenoid sinuses and right
maxillary sinus also noted.

Other: None.
IMPRESSION: No acute abnormality finding to explain the patient's symptoms.

Atherosclerosis.

Mild sinus disease.

## 2020-02-25 ENCOUNTER — Other Ambulatory Visit: Payer: Self-pay

## 2020-02-25 ENCOUNTER — Ambulatory Visit (INDEPENDENT_AMBULATORY_CARE_PROVIDER_SITE_OTHER): Payer: Medicare Other | Admitting: Physician Assistant

## 2020-02-25 ENCOUNTER — Encounter: Payer: Self-pay | Admitting: Physician Assistant

## 2020-02-25 VITALS — BP 100/62 | HR 100 | Ht 62.0 in | Wt 199.0 lb

## 2020-02-25 DIAGNOSIS — I10 Essential (primary) hypertension: Secondary | ICD-10-CM

## 2020-02-25 DIAGNOSIS — E785 Hyperlipidemia, unspecified: Secondary | ICD-10-CM | POA: Diagnosis not present

## 2020-02-25 DIAGNOSIS — N186 End stage renal disease: Secondary | ICD-10-CM

## 2020-02-25 DIAGNOSIS — I4819 Other persistent atrial fibrillation: Secondary | ICD-10-CM | POA: Diagnosis not present

## 2020-02-25 DIAGNOSIS — Z992 Dependence on renal dialysis: Secondary | ICD-10-CM

## 2020-02-25 DIAGNOSIS — I5032 Chronic diastolic (congestive) heart failure: Secondary | ICD-10-CM | POA: Diagnosis not present

## 2020-02-25 DIAGNOSIS — I350 Nonrheumatic aortic (valve) stenosis: Secondary | ICD-10-CM

## 2020-02-25 NOTE — Patient Instructions (Signed)
Medication Instructions:  Your physician recommends that you continue on your current medications as directed. Please refer to the Current Medication list given to you today.  *If you need a refill on your cardiac medications before your next appointment, please call your pharmacy*   Lab Work: NONE   If you have labs (blood work) drawn today and your tests are completely normal, you will receive your results only by: . MyChart Message (if you have MyChart) OR . A paper copy in the mail If you have any lab test that is abnormal or we need to change your treatment, we will call you to review the results.   Testing/Procedures: NONE    Follow-Up: At [facility], you and your health needs are our priority.  As part of our continuing mission to provide you with exceptional heart care, we have created designated Provider Care Teams.  These Care Teams include your primary Cardiologist (physician) and Advanced Practice Providers (APPs -  Physician Assistants and Nurse Practitioners) who all work together to provide you with the care you need, when you need it.  We recommend signing up for the patient portal called "MyChart".  Sign up information is provided on this After Visit Summary.  MyChart is used to connect with patients for Virtual Visits (Telemedicine).  Patients are able to view lab/test results, encounter notes, upcoming appointments, etc.  Non-urgent messages can be sent to your provider as well.   To learn more about what you can do with MyChart, go to [email or web address].    Your next appointment:   6 month(s)  The format for your next appointment:   In Person  Provider:   Samuel McDowell, MD   Other Instructions Thank you for choosing [facility]!    

## 2020-02-26 IMAGING — DX DG CHEST 1V PORT
1 series · 1 of 1 positions shown · non-contrast
Comparison: Chest radiograph 09/10/2019, chest CT 09/08/2019

CLINICAL DATA: Respiratory failure with hypoxemia.

EXAM:
PORTABLE CHEST 1 VIEW

[chest ap grid]
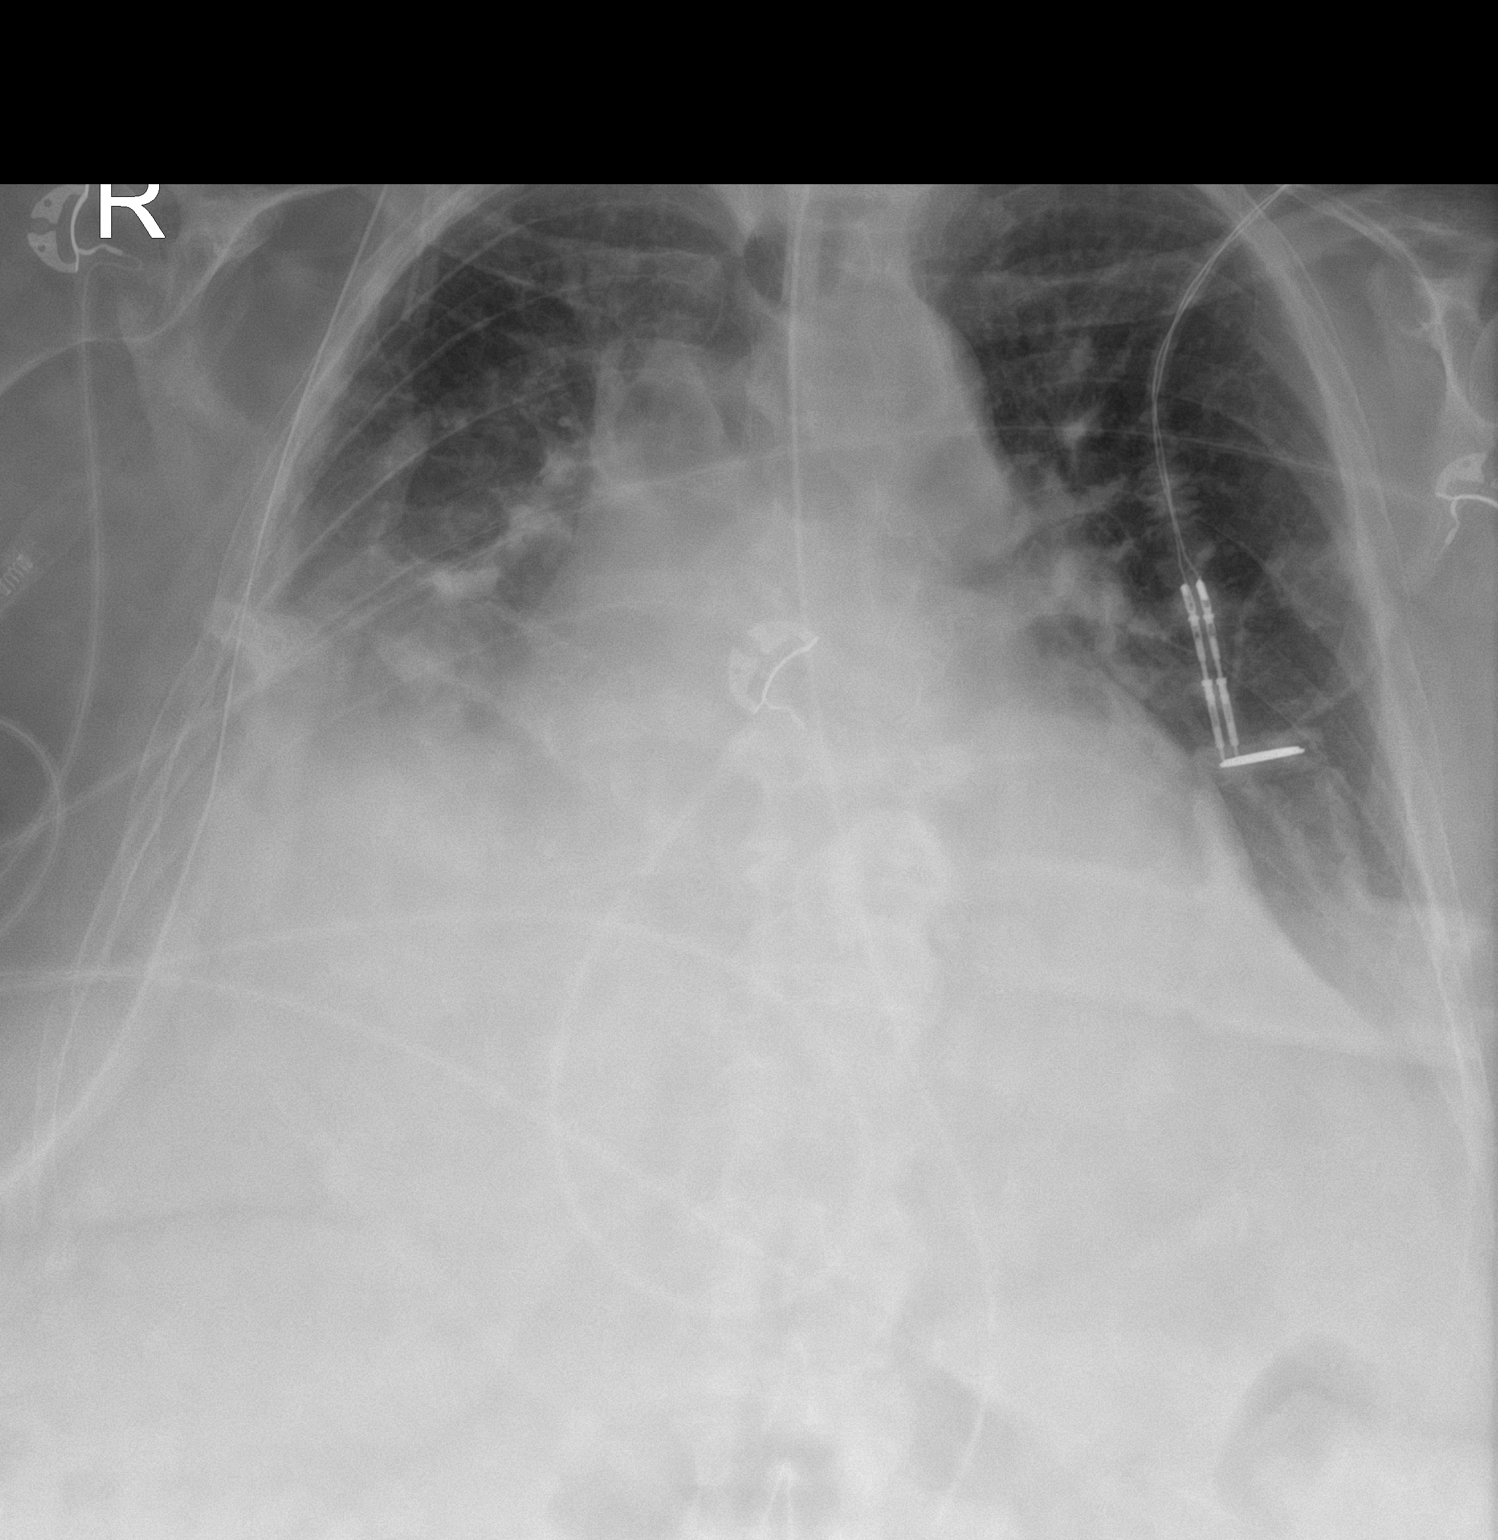

[1 of 1 positions shown; findings below may reference images not displayed]

FINDINGS: ET tube is unchanged position with tip terminating at the level of
the clavicular heads. An enteric tube passes below level of left
hemidiaphragm with tip excluded from the field of view. Overlying
cardiac monitoring leads.

Unchanged cardiomediastinal silhouette with cardiomegaly. Right
greater than left pleural effusions and bibasilar atelectasis,
similar to prior examination. No evidence of pneumothorax.
IMPRESSION: Support apparatus as described.

Similar appearance of right greater than left pleural effusions and
bibasilar atelectasis. Pneumonia at the lung bases cannot be
excluded.

Unchanged cardiomegaly.

## 2020-02-27 IMAGING — DX DG CHEST 1V PORT
1 series · 1 of 1 positions shown · non-contrast
Comparison: One-view chest x-ray 09/13/2019

CLINICAL DATA: Attempted placement of dialysis catheter.

EXAM:
PORTABLE CHEST 1 VIEW

[chest ap]
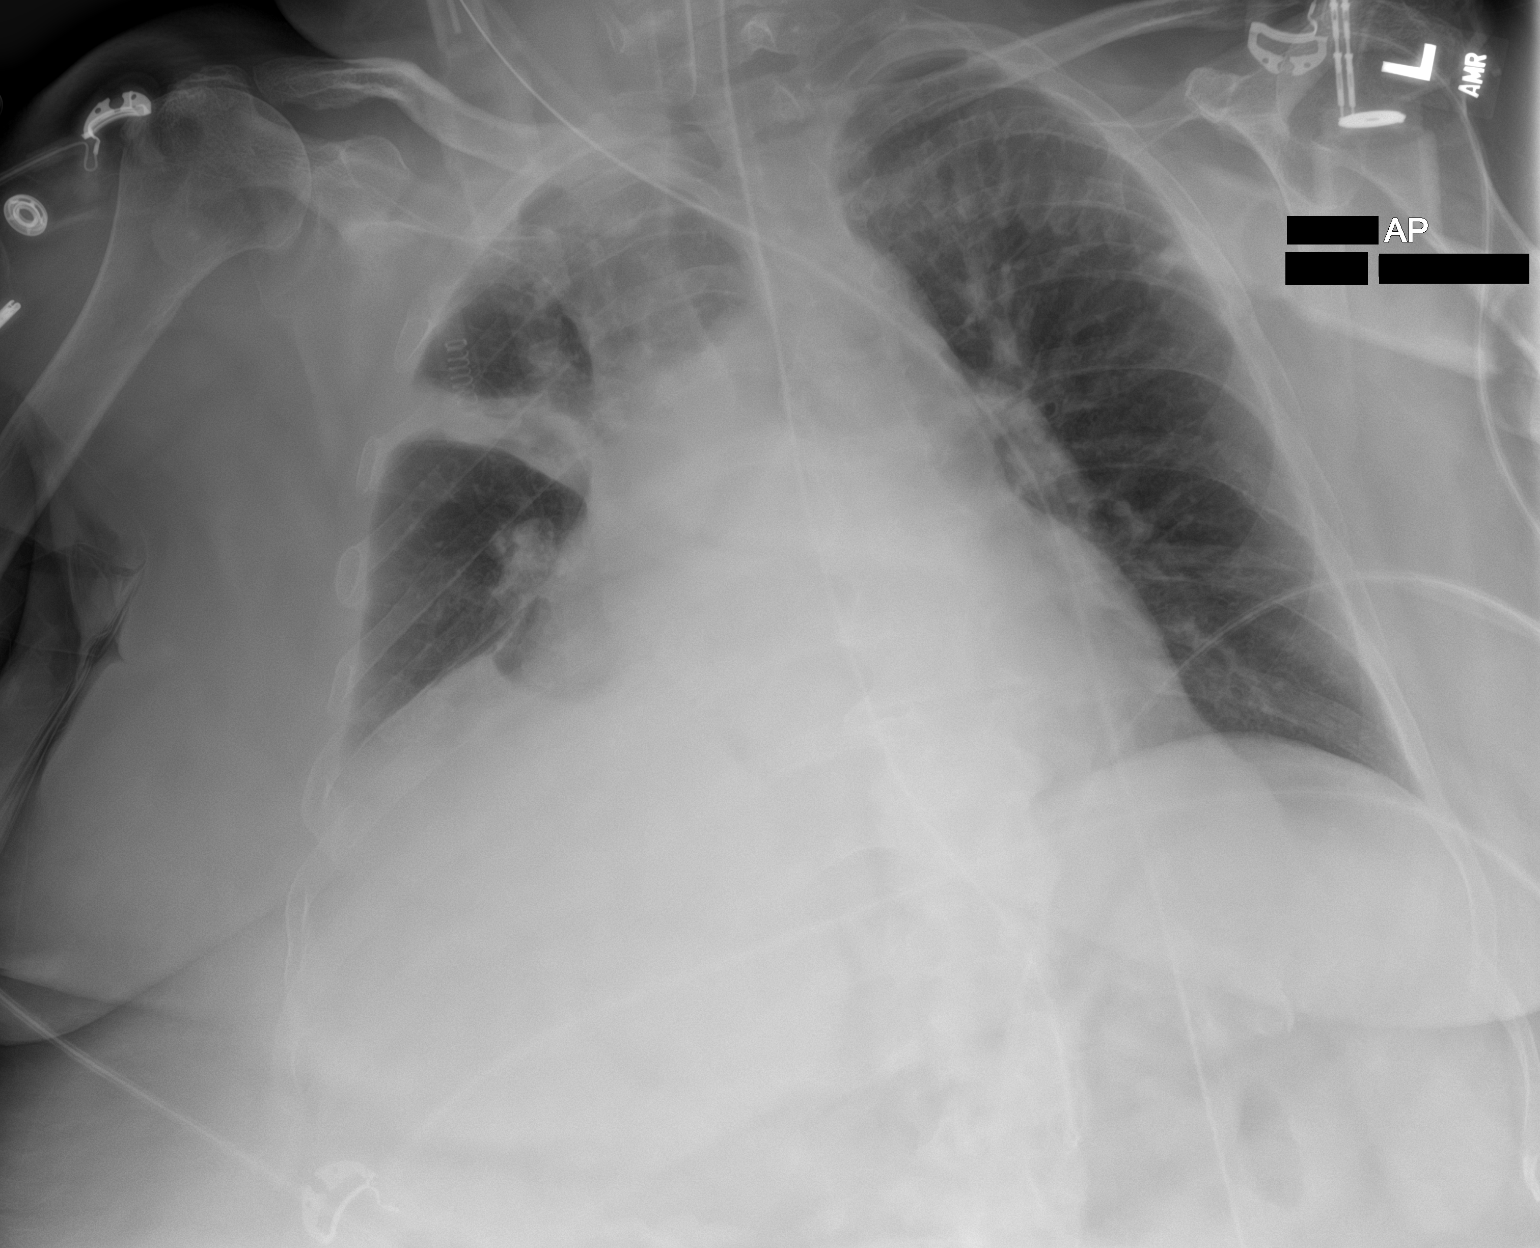

[1 of 1 positions shown; findings below may reference images not displayed]

FINDINGS: The heart is enlarged. Patient remains intubated. NG tube terminates
in the stomach.

Right pleural effusion is noted within the right minor fissure and
at the apex, likely redistributed due to positioning. Increased
right upper lobe airspace disease is present. The right base is
clear. The left lung is mostly clear.
IMPRESSION: 1. Increased right upper lobe airspace disease concerning for
pneumonia.
2. Small right pleural effusion.
3. Cardiomegaly without failure.

## 2020-02-29 IMAGING — DX DG CHEST 1V PORT
1 series · 1 of 1 positions shown · non-contrast
Comparison: September 14, 2019

CLINICAL DATA: Oxygen desaturation.

EXAM:
PORTABLE CHEST 1 VIEW

[chest ap]
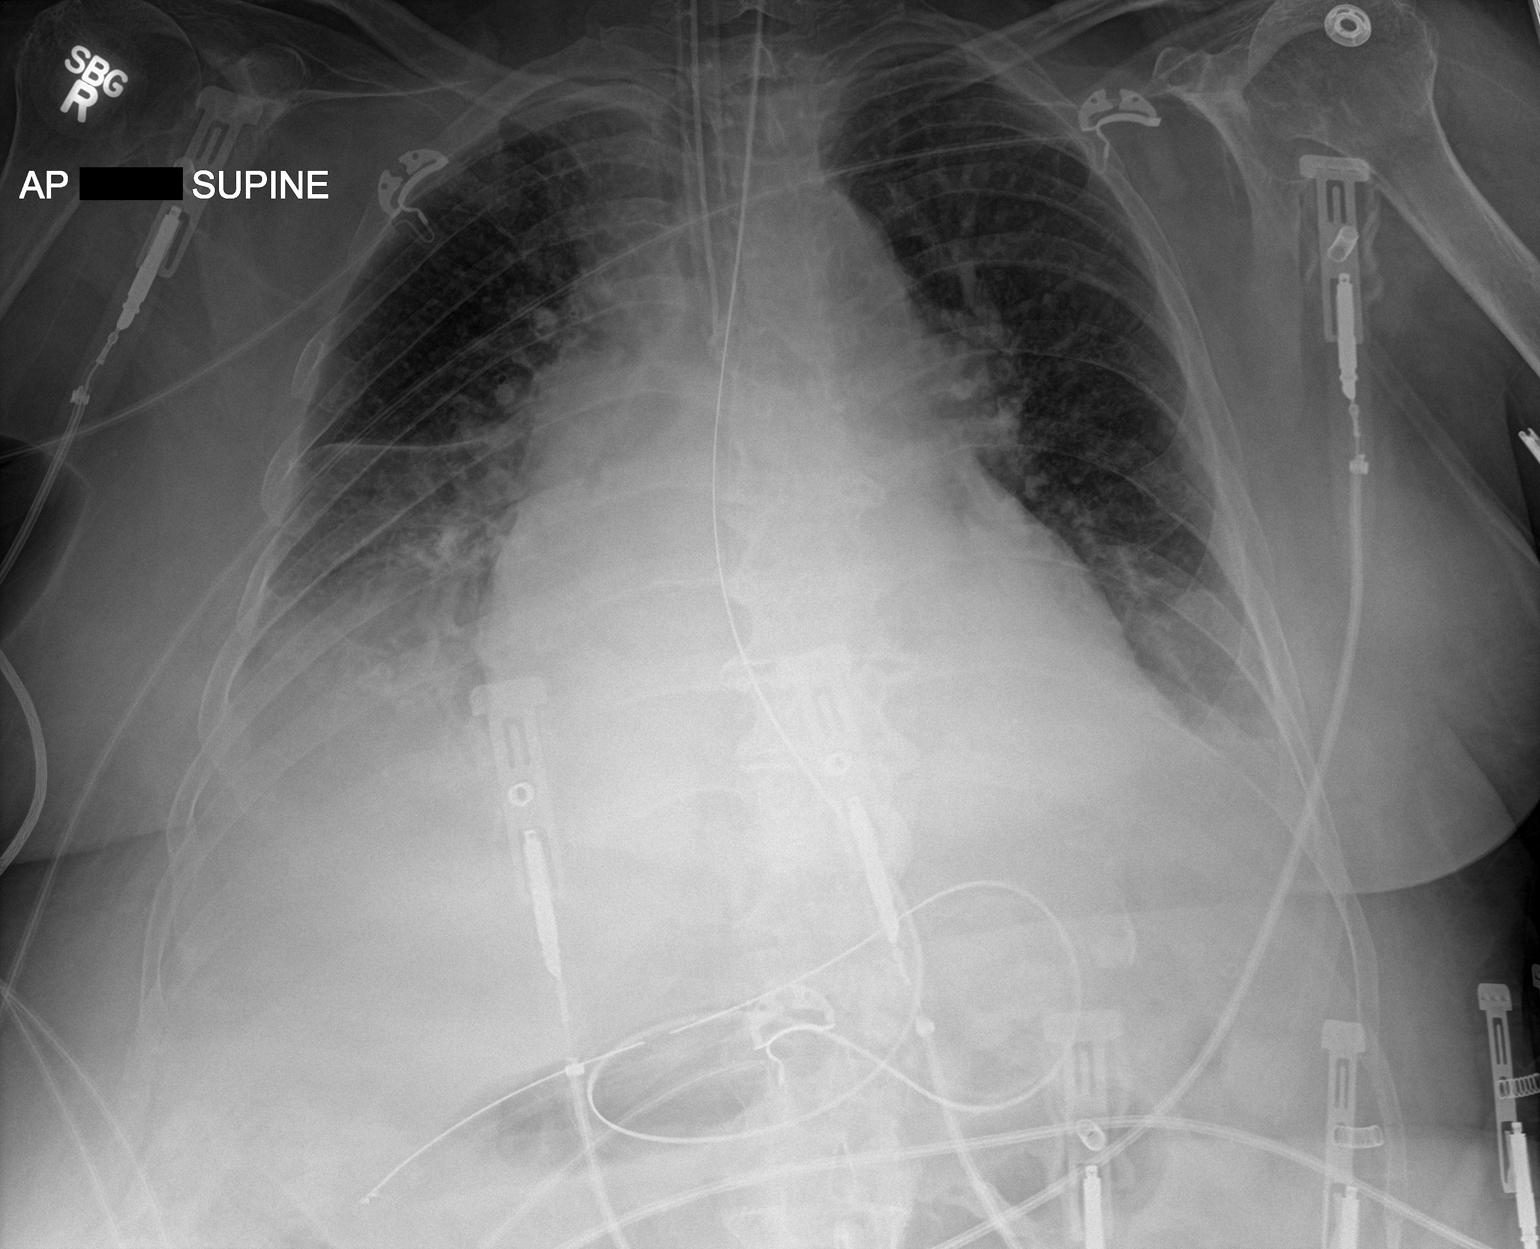

[1 of 1 positions shown; findings below may reference images not displayed]

FINDINGS: The ETT terminates 9 mm above the carina. The NG tube terminates in
the right mid abdomen. Small bilateral layering effusions with
underlying opacities, likely atelectasis. No pneumothorax. No other
interval changes.
IMPRESSION: 1. The ETT terminates 9 mm above the carina. Recommend withdrawing 2
cm. The NG tube terminates in either the distal stomach or proximal
duodenum.
2. Small bilateral pleural effusions with underlying opacities,
probably atelectasis.
3. No other acute abnormalities or changes.

These results will be called to the ordering clinician or
representative by the Radiologist Assistant, and communication
documented in the PACS or zVision Dashboard.

## 2020-03-06 ENCOUNTER — Emergency Department (HOSPITAL_COMMUNITY)
Admission: EM | Admit: 2020-03-06 | Discharge: 2020-03-07 | Disposition: A | Discharge status: Home / Self Care | Payer: Medicare Other | Attending: Emergency Medicine | Admitting: Emergency Medicine

## 2020-03-06 ENCOUNTER — Encounter (HOSPITAL_COMMUNITY): Payer: Self-pay | Admitting: Emergency Medicine

## 2020-03-06 ENCOUNTER — Other Ambulatory Visit: Payer: Self-pay

## 2020-03-06 DIAGNOSIS — Z7901 Long term (current) use of anticoagulants: Secondary | ICD-10-CM | POA: Diagnosis not present

## 2020-03-06 DIAGNOSIS — Z87891 Personal history of nicotine dependence: Secondary | ICD-10-CM | POA: Diagnosis not present

## 2020-03-06 DIAGNOSIS — J441 Chronic obstructive pulmonary disease with (acute) exacerbation: Secondary | ICD-10-CM | POA: Insufficient documentation

## 2020-03-06 DIAGNOSIS — Z20822 Contact with and (suspected) exposure to covid-19: Secondary | ICD-10-CM | POA: Insufficient documentation

## 2020-03-06 DIAGNOSIS — I5032 Chronic diastolic (congestive) heart failure: Secondary | ICD-10-CM | POA: Diagnosis not present

## 2020-03-06 DIAGNOSIS — R404 Transient alteration of awareness: Secondary | ICD-10-CM | POA: Diagnosis not present

## 2020-03-06 DIAGNOSIS — Z7982 Long term (current) use of aspirin: Secondary | ICD-10-CM | POA: Diagnosis not present

## 2020-03-06 DIAGNOSIS — E039 Hypothyroidism, unspecified: Secondary | ICD-10-CM | POA: Diagnosis not present

## 2020-03-06 DIAGNOSIS — N184 Chronic kidney disease, stage 4 (severe): Secondary | ICD-10-CM | POA: Diagnosis not present

## 2020-03-06 DIAGNOSIS — Z79899 Other long term (current) drug therapy: Secondary | ICD-10-CM | POA: Insufficient documentation

## 2020-03-06 DIAGNOSIS — Z992 Dependence on renal dialysis: Secondary | ICD-10-CM | POA: Diagnosis not present

## 2020-03-06 DIAGNOSIS — I130 Hypertensive heart and chronic kidney disease with heart failure and stage 1 through stage 4 chronic kidney disease, or unspecified chronic kidney disease: Secondary | ICD-10-CM | POA: Insufficient documentation

## 2020-03-06 DIAGNOSIS — R569 Unspecified convulsions: Secondary | ICD-10-CM | POA: Diagnosis present

## 2020-03-06 DIAGNOSIS — J45909 Unspecified asthma, uncomplicated: Secondary | ICD-10-CM | POA: Insufficient documentation

## 2020-03-06 NOTE — ED Triage Notes (Signed)
Pt here from [facility]. Facility called out for possible seizure, pt had a right sided gaze on arrival. Pt has hx of seizures.

## 2020-03-07 ENCOUNTER — Emergency Department (HOSPITAL_COMMUNITY): Payer: Medicare Other

## 2020-03-07 DIAGNOSIS — I21A1 Myocardial infarction type 2: Secondary | ICD-10-CM | POA: Diagnosis not present

## 2020-03-07 DIAGNOSIS — R0602 Shortness of breath: Secondary | ICD-10-CM | POA: Diagnosis present

## 2020-03-07 DIAGNOSIS — N390 Urinary tract infection, site not specified: Secondary | ICD-10-CM | POA: Diagnosis not present

## 2020-03-07 DIAGNOSIS — T82838A Hemorrhage of vascular prosthetic devices, implants and grafts, initial encounter: Secondary | ICD-10-CM | POA: Diagnosis not present

## 2020-03-07 DIAGNOSIS — N186 End stage renal disease: Secondary | ICD-10-CM | POA: Diagnosis not present

## 2020-03-07 DIAGNOSIS — J440 Chronic obstructive pulmonary disease with acute lower respiratory infection: Secondary | ICD-10-CM | POA: Diagnosis not present

## 2020-03-07 DIAGNOSIS — F202 Catatonic schizophrenia: Secondary | ICD-10-CM | POA: Diagnosis not present

## 2020-03-07 DIAGNOSIS — J181 Lobar pneumonia, unspecified organism: Secondary | ICD-10-CM | POA: Diagnosis not present

## 2020-03-07 DIAGNOSIS — R6521 Severe sepsis with septic shock: Secondary | ICD-10-CM | POA: Diagnosis not present

## 2020-03-07 DIAGNOSIS — D62 Acute posthemorrhagic anemia: Secondary | ICD-10-CM | POA: Diagnosis not present

## 2020-03-07 DIAGNOSIS — I5043 Acute on chronic combined systolic (congestive) and diastolic (congestive) heart failure: Secondary | ICD-10-CM | POA: Diagnosis not present

## 2020-03-07 DIAGNOSIS — Y718 Miscellaneous cardiovascular devices associated with adverse incidents, not elsewhere classified: Secondary | ICD-10-CM | POA: Diagnosis not present

## 2020-03-07 DIAGNOSIS — E2740 Unspecified adrenocortical insufficiency: Secondary | ICD-10-CM | POA: Diagnosis not present

## 2020-03-07 DIAGNOSIS — N2581 Secondary hyperparathyroidism of renal origin: Secondary | ICD-10-CM | POA: Diagnosis not present

## 2020-03-07 DIAGNOSIS — Y92239 Unspecified place in hospital as the place of occurrence of the external cause: Secondary | ICD-10-CM | POA: Diagnosis not present

## 2020-03-07 DIAGNOSIS — Z515 Encounter for palliative care: Secondary | ICD-10-CM | POA: Diagnosis not present

## 2020-03-07 DIAGNOSIS — I4892 Unspecified atrial flutter: Secondary | ICD-10-CM | POA: Diagnosis not present

## 2020-03-07 DIAGNOSIS — J441 Chronic obstructive pulmonary disease with (acute) exacerbation: Secondary | ICD-10-CM | POA: Diagnosis not present

## 2020-03-07 DIAGNOSIS — A4181 Sepsis due to Enterococcus: Secondary | ICD-10-CM | POA: Diagnosis not present

## 2020-03-07 DIAGNOSIS — G92 Toxic encephalopathy: Secondary | ICD-10-CM | POA: Diagnosis not present

## 2020-03-07 DIAGNOSIS — K7200 Acute and subacute hepatic failure without coma: Secondary | ICD-10-CM | POA: Diagnosis not present

## 2020-03-07 DIAGNOSIS — J9621 Acute and chronic respiratory failure with hypoxia: Secondary | ICD-10-CM | POA: Diagnosis not present

## 2020-03-07 DIAGNOSIS — E662 Morbid (severe) obesity with alveolar hypoventilation: Secondary | ICD-10-CM | POA: Diagnosis not present

## 2020-03-07 DIAGNOSIS — I132 Hypertensive heart and chronic kidney disease with heart failure and with stage 5 chronic kidney disease, or end stage renal disease: Secondary | ICD-10-CM | POA: Diagnosis not present

## 2020-03-07 DIAGNOSIS — I4819 Other persistent atrial fibrillation: Secondary | ICD-10-CM | POA: Diagnosis not present

## 2020-03-07 DIAGNOSIS — Z20822 Contact with and (suspected) exposure to covid-19: Secondary | ICD-10-CM | POA: Diagnosis not present

## 2020-03-07 DIAGNOSIS — Z66 Do not resuscitate: Secondary | ICD-10-CM | POA: Diagnosis not present

## 2020-03-07 DIAGNOSIS — I472 Ventricular tachycardia: Secondary | ICD-10-CM | POA: Diagnosis not present

## 2020-03-07 LAB — CBC WITH DIFFERENTIAL/PLATELET
Abs Immature Granulocytes: 0.06 10*3/uL (ref 0.00–0.07)
Basophils Absolute: 0 10*3/uL (ref 0.0–0.1)
Basophils Relative: 0 %
Eosinophils Absolute: 0.1 10*3/uL (ref 0.0–0.5)
Eosinophils Relative: 1 %
HCT: 29.7 % — ABNORMAL LOW (ref 36.0–46.0)
Hemoglobin: 9.8 g/dL — ABNORMAL LOW (ref 12.0–15.0)
Immature Granulocytes: 1 %
Lymphocytes Relative: 14 %
Lymphs Abs: 1.3 10*3/uL (ref 0.7–4.0)
MCH: 36.6 pg — ABNORMAL HIGH (ref 26.0–34.0)
MCHC: 33 g/dL (ref 30.0–36.0)
MCV: 110.8 fL — ABNORMAL HIGH (ref 80.0–100.0)
Monocytes Absolute: 0.9 10*3/uL (ref 0.1–1.0)
Monocytes Relative: 9 %
Neutro Abs: 7 10*3/uL (ref 1.7–7.7)
Neutrophils Relative %: 75 %
Platelets: 169 10*3/uL (ref 150–400)
RBC: 2.68 MIL/uL — ABNORMAL LOW (ref 3.87–5.11)
RDW: 15.9 % — ABNORMAL HIGH (ref 11.5–15.5)
WBC: 9.3 10*3/uL (ref 4.0–10.5)
nRBC: 0 % (ref 0.0–0.2)

## 2020-03-07 LAB — BASIC METABOLIC PANEL
Anion gap: 8 (ref 5–15)
BUN: 24 mg/dL — ABNORMAL HIGH (ref 8–23)
CO2: 28 mmol/L (ref 22–32)
Calcium: 8.5 mg/dL — ABNORMAL LOW (ref 8.9–10.3)
Chloride: 101 mmol/L (ref 98–111)
Creatinine, Ser: 2.2 mg/dL — ABNORMAL HIGH (ref 0.44–1.00)
GFR calc Af Amer: 26 mL/min — ABNORMAL LOW (ref >60)
GFR calc non Af Amer: 22 mL/min — ABNORMAL LOW (ref >60)
Glucose, Bld: 255 mg/dL — ABNORMAL HIGH (ref 70–99)
Potassium: 3 mmol/L — ABNORMAL LOW (ref 3.5–5.1)
Sodium: 137 mmol/L (ref 135–145)

## 2020-03-07 LAB — BLOOD GAS, ARTERIAL
Acid-Base Excess: 4.8 mmol/L — ABNORMAL HIGH (ref 0.0–2.0)
Bicarbonate: 28.3 mmol/L — ABNORMAL HIGH (ref 20.0–28.0)
Drawn by: 38235
FIO2: 28
O2 Saturation: 99.1 %
Patient temperature: 37
pCO2 arterial: 52 mmHg — ABNORMAL HIGH (ref 32.0–48.0)
pH, Arterial: 7.375 (ref 7.350–7.450)
pO2, Arterial: 148 mmHg — ABNORMAL HIGH (ref 83.0–108.0)

## 2020-03-07 LAB — SARS CORONAVIRUS 2 BY RT PCR (HOSPITAL ORDER, PERFORMED IN ~~LOC~~ HOSPITAL LAB): SARS Coronavirus 2: NEGATIVE

## 2020-03-07 MED ORDER — ALBUTEROL SULFATE (2.5 MG/3ML) 0.083% IN NEBU: 5.0000 mg | INHALATION_SOLUTION | Freq: Once | RESPIRATORY_TRACT | Status: DC

## 2020-03-07 MED ORDER — AEROCHAMBER Z-STAT PLUS/MEDIUM MISC: 1.0000 | Freq: Once | Status: DC

## 2020-03-07 MED ORDER — METHYLPREDNISOLONE SODIUM SUCC 125 MG IJ SOLR
125.0000 mg | Freq: Once | INTRAMUSCULAR | Status: AC
  Administered 2020-03-07: 125 mg via INTRAVENOUS
  Filled 2020-03-07: qty 2

## 2020-03-07 MED ORDER — IPRATROPIUM BROMIDE 0.02 % IN SOLN: 0.5000 mg | Freq: Once | RESPIRATORY_TRACT | Status: DC

## 2020-03-07 MED ORDER — ALBUTEROL SULFATE HFA 108 (90 BASE) MCG/ACT IN AERS
4.0000 | INHALATION_SPRAY | Freq: Once | RESPIRATORY_TRACT | Status: AC
  Administered 2020-03-07: 4 via RESPIRATORY_TRACT
  Filled 2020-03-07: qty 6.7

## 2020-03-07 MED ORDER — ALBUTEROL SULFATE (2.5 MG/3ML) 0.083% IN NEBU
2.5000 mg | INHALATION_SOLUTION | Freq: Once | RESPIRATORY_TRACT | Status: AC
  Administered 2020-03-07: 2.5 mg via RESPIRATORY_TRACT
  Filled 2020-03-07: qty 3

## 2020-03-07 MED ORDER — IPRATROPIUM-ALBUTEROL 0.5-2.5 (3) MG/3ML IN SOLN
3.0000 mL | Freq: Once | RESPIRATORY_TRACT | Status: AC
  Administered 2020-03-07: 3 mL via RESPIRATORY_TRACT
  Filled 2020-03-07: qty 3

## 2020-03-07 NOTE — ED Provider Notes (Signed)
[name]   CSN: [id number] Arrival date & time: 03/06/20  2304     History Chief Complaint  Patient presents with  . Seizures  Level 5 caveat due to altered mental status  [name] is a 69 y.o. female.  The history is provided by the patient. The history is limited by the condition of the patient.  Seizures Seizure activity on arrival: no   Patient with extensive history including seizures, CHF, ESRD on dialysis, hypertension presents with possible seizure.  Patient presents from nursing facility after being noted to have a seizure.  On my exam, patient reports feeling improved.  She does report feeling short of breath and having chest pain.  She does report she gets dialysis regularly.  No other details are known on arrival     Past Medical History:  Diagnosis Date  . Anemia   . Angina pectoris ([place])   . Anxiety   . Asthma   . Atrial flutter ([place])   . Back pain   . Barrett esophagus   . Bifascicular block   . BMI 39.0-39.9,adult   . Cellulitis   . CHF (congestive heart failure) ([place])   . Chronic diastolic CHF (congestive heart failure) ([place])   . Constipation   . Contact with and (suspected) exposure to covid-19   . COPD (chronic obstructive pulmonary disease) ([place])   . Dependence on renal dialysis ([place])   . Depression   . Dysphagia   . Dysuria   . Epilepsy ([place])   . ESRD on hemodialysis ([place])   . Essential hypertension   . Fibromyalgia   . Fibromyalgia   . GERD (gastroesophageal reflux disease)   . Gout   . History of pericarditis   . Hypercholesterolemia   . Hyperlipidemia   . Hypothyroid   . Hypothyroidism   . Insomnia   . Long term (current) use of insulin ([place])   . Major depressive disorder   . Memory loss   . Moderate protein-calorie malnutrition ([place])   . Morbid obesity ([place])   . Nephrolithiasis    2008  . Obesity hypoventilation syndrome (HCC)    CPAP  . Obstructive sleep apnea   . Pain in left  arm   . PE (pulmonary embolism)    2010  . Peripheral neuropathy   . Persistent atrial fibrillation ([place])   . Pneumonia   . Polyp of nasal sinus   . PTSD (post-traumatic stress disorder)   . Pulmonary hypertension ([place])   . Rheumatoid arteritis ([place])   . Rheumatoid arthritis ([place])   . Schizophrenia ([place])   . 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 Seizures ([place])   . 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 Tachycardia, unspecified   . Type 2 diabetes mellitus ([place])   . Unspecified intellectual disabilities   . Unspecified psychosis not due to a substance or known physiological condition ([place])   . Urinary tract infection   . Vitamin D deficiency     Patient Active Problem List   Diagnosis Date Noted  . Respiratory failure with hypoxia ([place]) 01/24/2020  . Hypoxia 01/23/2020  . Acute on chronic respiratory failure with hypoxia ([place]) 01/23/2020  . Persistent atrial fibrillation ([place])   . Cellulitis and abscess of right leg 11/23/2019  . ESRD (end stage renal disease) ([place]) 11/23/2019  . Elevated troponin   . Atrial fibrillation with rapid ventricular response ([place]) 11/22/2019  . Acute pulmonary edema (HCC)   . Acute respiratory failure with  hypoxia and hypercapnia ([place]) 09/10/2019  . SVT (supraventricular tachycardia) ([place]) 09/10/2019  . On mechanically assisted ventilation ([place]) 09/08/2019  . Acute metabolic encephalopathy 15/72/6203  . Volume overload 09/06/2019  . AKI (acute kidney injury) ([place]) 09/06/2019  . Altered mental state 11/28/2018  . Near syncope 11/28/2018  . Fall at home 11/28/2018  . History of seizure 11/28/2018  . 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 Pressure injury of skin 09/17/2018  . Respiratory failure ([place]) 08/16/2018  . Seizure ([place]) 08/16/2018  . Acute encephalopathy 07/27/2018  . Endotracheally intubated 07/27/2018  . Hypothyroidism 07/27/2018  . Pleuritic chest pain 07/14/2018  . Moderate persistent asthma without complication   . Type  2 diabetes mellitus ([place]) 04/19/2018  . Acute kidney injury superimposed on chronic kidney disease ([place]) 01/13/2018  . Altered mental status   . Encephalopathy 10/11/2016  . Chest pain 08/12/2016  . CKD (chronic kidney disease), stage IV ([place]) 08/12/2016  . History of pulmonary embolus (PE) 08/12/2016  . Obesity, Class III, BMI 40-49.9 (morbid obesity) ([place]) 08/12/2016  . RBBB 08/12/2016  . Positive D dimer 08/12/2016  . Cerebral infarction ([place]) 11/10/2015  . Physical deconditioning 07/01/2011  . Weakness 07/01/2011  . Diabetic foot ulcer ([place]) 05/19/2011  . Polypharmacy 02/09/2011  . BACK PAIN WITH RADICULOPATHY 05/25/2010  . Type 2 diabetes mellitus with diabetic polyneuropathy, without long-term current use of insulin ([place]) 08/18/2009  . Chronic diastolic heart failure ([place]) 04/25/2009  . Normocytic anemia 04/14/2009  . Sleep apnea 12/11/2008  . Diabetic peripheral neuropathy ([place]) 05/04/2007  . HYPERCHOLESTEROLEMIA 10/13/2006  . Gout, unspecified 10/13/2006  . HYPOKALEMIA 10/13/2006  . Schizophrenia ([place]) 10/13/2006  . Depression 10/13/2006  . Essential hypertension 10/13/2006  . Venous (peripheral) insufficiency 10/13/2006  . RHINITIS, ALLERGIC 10/13/2006  . Asthma 10/13/2006  . Reflux esophagitis 10/13/2006  . OSTEOARTHRITIS, MULTI SITES 10/13/2006  . INCONTINENCE, URGE 10/13/2006    Past Surgical History:  Procedure Laterality Date  . A/V FISTULAGRAM N/A 01/28/2020   Procedure: A/V FISTULAGRAM - Right [place];  Surgeon: Waynetta Sandy, MD;  Location: [facility];  Service: Cardiovascular;  Laterality: N/A;  . APPENDECTOMY    . AV FISTULA PLACEMENT Right 09/27/2019   Procedure: ARTERIOVENOUS (AV) FISTULA CREATION;  Surgeon: Waynetta Sandy, MD;  Location: [facility];  Service: Vascular;  Laterality: Right;  . AV FISTULA PLACEMENT Right 02/20/2020   Procedure: INSERTION OF ARTERIOVENOUS (AV) GORE-TEX GRAFT ARM;  Surgeon: Waynetta Sandy, MD;   Location: [facility];  Service: Vascular;  Laterality: Right;  . CHOLECYSTECTOMY    . COLONOSCOPY     benign polyps (12/2000), repeat colonoscopy performed in 2007  . ENDOMETRIAL BIOPSY     10/03 benign columnar mucosa (limited material)  . INSERTION OF DIALYSIS CATHETER Right 09/14/2019   Procedure: ATTEMPTED INSERTION OF DIALYSIS CATHETER;  Surgeon: Virl Cagey, MD;  Location: [facility];  Service: General;  Laterality: Right;  . INSERTION OF DIALYSIS CATHETER Right 09/27/2019   Procedure: INSERTION OF DIALYSIS CATHETER;  Surgeon: Waynetta Sandy, MD;  Location: [facility];  Service: Vascular;  Laterality: Right;  . KIDNEY SURGERY    . REVISON OF ARTERIOVENOUS FISTULA Right 02/20/2020   Procedure: REVISON OF ARTERIOVENOUS FISTULA;  Surgeon: Waynetta Sandy, MD;  Location: [facility];  Service: Vascular;  Laterality: Right;     OB History   No obstetric history on file.     Family History  Problem Relation Age of Onset  . Bone cancer Mother   .  Hypertension Mother   . Heart disease Mother   . COPD Father   . Heart disease Father   . Stroke Father     Social History   Tobacco Use  . Smoking status: Former Smoker    Types: Cigarettes    Quit date: 06/23/1972    Years since quitting: 47.7  . Smokeless tobacco: Never Used  Vaping Use  . Vaping Use: Never used  Substance Use Topics  . Alcohol use: No  . Drug use: No    Home Medications Prior to Admission medications   Medication Sig Start Date End Date Taking? Authorizing Provider  acetaminophen (TYLENOL) 500 MG tablet Take 500 mg by mouth 2 (two) times daily.    [provider]  albuterol (VENTOLIN HFA) 108 (90 Base) MCG/ACT inhaler Inhale 1 puff into the lungs every 6 (six) hours as needed for wheezing or shortness of breath.    [provider]  Amino Acids (AMINO ACID PROTEIN PO) Take 30 mLs by mouth in the morning and at bedtime.    [provider]  apixaban (ELIQUIS) 5 MG TABS tablet  Take 1 tablet (5 mg total) by mouth 2 (two) times daily. 10/01/19   Shelly Coss, MD  ARIPiprazole (ABILIFY) 2 MG tablet Take 2 mg by mouth daily. 12/10/19   [provider]  aspirin EC 81 MG tablet Take 1 tablet (81 mg total) by mouth daily with breakfast. 01/25/20   Denton Brick, Courage, MD  atorvastatin (LIPITOR) 20 MG tablet Take 20 mg by mouth at bedtime.  05/14/19   [provider]  buPROPion (WELLBUTRIN) 75 MG tablet Take 75 mg by mouth daily.  01/02/20   [provider]  calcitRIOL (ROCALTROL) 0.25 MCG capsule Take 1 capsule (0.25 mcg total) by mouth every Monday, Wednesday, and Friday. 10/01/19   Shelly Coss, MD  Calcium Acetate 667 MG TABS Take 2,001 mg by mouth 3 (three) times daily.    [provider]  calcium carbonate (TUMS - DOSED IN MG ELEMENTAL CALCIUM) 500 MG chewable tablet Chew 500 mg by mouth every 6 (six) hours as needed for indigestion or heartburn.     [provider]  Cholecalciferol (VITAMIN D3) 50 MCG (2000 UT) TABS Take 4,000 Units by mouth daily.     [provider]  Dextromethorphan-guaiFENesin 5-100 MG/5ML LIQD Take 5 mLs by mouth daily as needed (cough).    [provider]  diltiazem (CARDIZEM LA) 360 MG 24 hr tablet Take 360 mg by mouth daily.    [provider]  Dulaglutide (TRULICITY) 1.5 WL/8.9HT SOPN Inject 1.5 mg into the skin every Friday.     [provider]  DULoxetine (CYMBALTA) 60 MG capsule Take 60 mg by mouth daily.  05/11/19   [provider]  EPINEPHrine (EPI-PEN) 0.3 mg/0.3 mL DEVI Inject 0.3 mg into the muscle once as needed (for bee stings, then go to the ED immediately after using).     [provider]  gabapentin (NEURONTIN) 100 MG capsule Take 1 capsule (100 mg total) by mouth 3 (three) times daily. 10/01/19   Shelly Coss, MD  insulin aspart (NOVOLOG) 100 UNIT/ML injection Inject 10 Units into the skin 3 (three) times daily before meals. Additional  units according to sliding scale 151-200= 2 units 201-250= 4 units 251-300= 6 units 301-350= 8 units 351-400=10units 401-450=12units >400- call MD    [provider]  levETIRAcetam (KEPPRA) 250 MG tablet Take 250 mg by mouth 2 (two) times daily.  [provider]  levothyroxine (SYNTHROID) 50 MCG tablet Take 50 mcg by mouth daily before breakfast.    [provider]  metoprolol succinate (TOPROL-XL) 200 MG 24 hr tablet Take 1 tablet (200 mg total) by mouth daily. Take with or immediately following a meal. 11/27/19   Tat, Shanon Brow, MD  montelukast (SINGULAIR) 10 MG tablet Take 10 mg by mouth at bedtime.    [provider]  Multiple Vitamin (DAILY VITE PO) Take 1 tablet by mouth every morning.     [provider]  nitroGLYCERIN (NITROSTAT) 0.4 MG SL tablet Place 0.4 mg under the tongue every 5 (five) minutes x 3 doses as needed for chest pain.  01/13/20   [provider]  omeprazole (PRILOSEC) 20 MG capsule Take 20 mg by mouth every morning.     [provider]  ondansetron (ZOFRAN) 4 MG tablet Take 4 mg by mouth every 6 (six) hours as needed for nausea or vomiting.    [provider]  predniSONE (DELTASONE) 10 MG tablet Take 10 mg by mouth daily.    [provider]  primidone (MYSOLINE) 50 MG tablet Take 50 mg by mouth daily.     [provider]  pyridoxine (B-6) 200 MG tablet Take 200 mg by mouth in the morning and at bedtime.    [provider]  senna (SENOKOT) 8.6 MG tablet Take 1 tablet by mouth 2 (two) times daily.    [provider]  traMADol (ULTRAM) 50 MG tablet Take 1 tablet (50 mg total) by mouth every 8 (eight) hours as needed. 02/20/20   Rhyne, Hulen Shouts, PA-C  traZODone (DESYREL) 50 MG tablet Take 50 mg by mouth at bedtime.  05/11/19   [provider]    Allergies    Benztropine mesylate, Codeine, Diazepam, Diphenhydramine hcl, Penicillins, Sulfonamide derivatives,  Thioridazine hcl, Bee venom, and Lisinopril  Review of Systems   Review of Systems  Unable to perform ROS: Mental status change  Neurological: Positive for seizures.    Physical Exam Updated Vital Signs BP (!) 139/92   Pulse 102   Temp 98 F (36.7 C) (Rectal)   Resp 18   Ht 1.575 m (5\' 2" )   Wt 90 kg   SpO2 100%   BMI 36.29 kg/m  Patient on 2 L oxygen, approximately 90% on room air Physical Exam CONSTITUTIONAL: Chronically ill-appearing HEAD: Normocephalic/atraumatic EYES: EOMI/PERRL ENMT: Mucous membranes moist NECK: supple no meningeal signs SPINE/BACK:entire spine nontender CV: Irregular, mild tachycardia LUNGS: Tachypnea, wheezing bilaterally ABDOMEN: soft, nontender, no rebound or guarding, bowel sounds noted throughout abdomen GU:no cva tenderness NEURO: Pt is awake/alert/appropriate, moves all extremitiesx4.  No facial droop.   EXTREMITIES: pulses normal/equal, full ROM, chronic edema no lower extremities SKIN: warm, color normal, dialysis catheter to left chest PSYCH: Flat affect  ED Results / Procedures / Treatments   Labs (all labs ordered are listed, but only abnormal results are displayed) Labs Reviewed  BASIC METABOLIC PANEL - Abnormal; Notable for the following components:      Result Value   Potassium 3.0 (*)    Glucose, Bld 255 (*)    BUN 24 (*)    Creatinine, Ser 2.20 (*)    Calcium 8.5 (*)    GFR calc non Af Amer 22 (*)    GFR calc Af Amer 26 (*)    All other components within normal limits  CBC WITH DIFFERENTIAL/PLATELET - Abnormal; Notable for the following components:   RBC 2.68 (*)  Hemoglobin 9.8 (*)    HCT 29.7 (*)    MCV 110.8 (*)    MCH 36.6 (*)    RDW 15.9 (*)    All other components within normal limits  BLOOD GAS, ARTERIAL - Abnormal; Notable for the following components:   pCO2 arterial 52.0 (*)    pO2, Arterial 148 (*)    Bicarbonate 28.3 (*)    Acid-Base Excess 4.8 (*)    All other components within normal limits  SARS  CORONAVIRUS 2 BY RT PCR [facility] ORDER, [facility])    EKG EKG Interpretation  Date/Time:  Thursday March 06 2020 23:18:43 EDT Ventricular Rate:  107 PR Interval:    QRS Duration: 186 QT Interval:  410 QTC Calculation: 548 R Axis:   -57 Text Interpretation: Atrial fibrillation RBBB and LAFB Left ventricular hypertrophy No significant change since last tracing Confirmed by Ripley Fraise [phone]) on 03/07/2020 12:20:10 AM   Radiology CT Head Wo Contrast  Result Date: 03/07/2020 CLINICAL DATA:  Delirium.  Possible seizure.  Rightward gaze. EXAM: CT HEAD WITHOUT CONTRAST TECHNIQUE: Contiguous axial images were obtained from the base of the skull through the vertex without intravenous contrast. COMPARISON:  10/21/2019 FINDINGS: Brain: There is no evidence of acute infarct, intracranial hemorrhage, mass, midline shift, or extra-axial fluid collection. Mild cerebral atrophy is unchanged. Cerebral white matter hypodensities are also unchanged and nonspecific but compatible with mild chronic small vessel ischemic disease. Vascular: Calcified atherosclerosis at the skull base. No hyperdense vessel. Skull: No fracture or suspicious osseous lesion. Sinuses/Orbits: Mild polypoid mucosal thickening and/or small mucous retention cysts in the maxillary sinuses. Clear mastoid air cells. Bilateral cataract extraction. Other: None. IMPRESSION: 1. No evidence of acute intracranial abnormality. 2. Mild chronic small vessel ischemic disease. Electronically Signed   By: Logan Bores M.D.   On: 03/07/2020 04:26   DG Chest Port 1 View  Result Date: 03/07/2020 CLINICAL DATA:  Seizure, short of breath, asthma EXAM: PORTABLE CHEST 1 VIEW COMPARISON:  01/23/2020 FINDINGS: Single frontal view of the chest demonstrates stable left internal jugular dialysis catheter. Cardiac silhouette is enlarged but stable. There is chronic central vascular congestion, without acute airspace disease. Trace right  pleural effusion. No pneumothorax. IMPRESSION: 1. Vascular congestion without overt edema. 2. Trace right pleural effusion. Electronically Signed   By: Randa Ngo M.D.   On: 03/07/2020 00:46    Procedures Procedures  Medications Ordered in ED Medications  aerochamber Z-Stat Plus/medium 1 each (has no administration in time range)  albuterol (VENTOLIN HFA) 108 (90 Base) MCG/ACT inhaler 4 puff (4 puffs Inhalation Given 03/07/20 0022)  methylPREDNISolone sodium succinate (SOLU-MEDROL) 125 mg/2 mL injection 125 mg (125 mg Intravenous Given 03/07/20 0114)  ipratropium-albuterol (DUONEB) 0.5-2.5 (3) MG/3ML nebulizer solution 3 mL (3 mLs Nebulization Given 03/07/20 0251)  albuterol (PROVENTIL) (2.5 MG/3ML) 0.083% nebulizer solution 2.5 mg (2.5 mg Nebulization Given 03/07/20 0251)    ED Course  I have reviewed the triage vital signs and the nursing notes.  Pertinent labs & imaging results that were available during my care of the patient were reviewed by me and considered in my medical decision making (see chart for details).    MDM Rules/Calculators/A&P                         12:37 AM I attempted to call the facility but the nurse never picked up the phone. Patient has history of known seizures on Keppra.  No  seizures at this time.  She appears to be getting back to her baseline.  However patient is short of breath.  X-ray is pending at this time, she is on 2 L nasal cannula at this time 4:03 AM Overall patient is improved.  Her respiratory status is improved.  She is not hypoxic on room air. No signs of respiratory failure. I was able to get in touch with nursing at her facility.  Nurse report was the patient was found with altered mental status but no actual seizure activity.  Unclear cause of AMS.  Patient is on anticoagulation per records. We will plan for CT head.  If negative she will be discharged back. 4:30 AM CT head negative.  Patient otherwise stable.  No focal weakness noted on  initial exam.  Suspect patient may have had a seizure to cause her confusion at the nursing facility. Patient will be discharged back to facility   This patient presents to the ED for concern of seizures and shortness of breath, this involves an extensive number of treatment options, and is a complaint that carries with it a high risk of complications and morbidity.  The differential diagnosis includes epilepsy, COPD, CHF   Lab Tests:   I Ordered, reviewed, and interpreted labs, which included electrolytes and complete blood count  Medicines ordered:   I ordered medication albuterol for wheezing  Imaging Studies ordered:   I ordered imaging studies which included chest x-ray /CT head  I independently visualized and interpreted imaging which showed negative/no acute findings  Additional history obtained:    Previous records obtained and reviewed  Reevaluation:  After the interventions stated above, I reevaluated the patient and found patient improved  Final Clinical Impression(s) / ED Diagnoses Final diagnoses:  COPD exacerbation ([place])  Transient alteration of awareness    Rx / DC Orders ED Discharge Orders    None       Ripley Fraise, MD 03/07/20 [phone]

## 2020-03-07 NOTE — ED Notes (Signed)
Pt alert and asking what happened to her;

## 2020-03-07 NOTE — ED Notes (Signed)
Pt given cup of water per request.

## 2020-03-10 ENCOUNTER — Inpatient Hospital Stay (HOSPITAL_COMMUNITY): Payer: Medicare Other

## 2020-03-10 ENCOUNTER — Emergency Department (HOSPITAL_COMMUNITY): Payer: Medicare Other

## 2020-03-10 ENCOUNTER — Other Ambulatory Visit: Payer: Self-pay

## 2020-03-10 ENCOUNTER — Emergency Department (HOSPITAL_COMMUNITY)
Admission: EM | Admit: 2020-03-10 | Discharge: 2020-03-10 | Discharge status: Absent without leave | Payer: Medicare Other

## 2020-03-10 ENCOUNTER — Other Ambulatory Visit (HOSPITAL_COMMUNITY): Payer: Medicare Other

## 2020-03-10 ENCOUNTER — Inpatient Hospital Stay (HOSPITAL_COMMUNITY)
Admission: EM | Admit: 2020-03-10 | Discharge: 2020-03-26 | DRG: 871 | Disposition: A | Discharge status: Skilled Nursing Facility | Payer: Medicare Other | Source: Skilled Nursing Facility | Attending: Internal Medicine | Admitting: Internal Medicine

## 2020-03-10 DIAGNOSIS — M069 Rheumatoid arthritis, unspecified: Secondary | ICD-10-CM | POA: Diagnosis present

## 2020-03-10 DIAGNOSIS — R0602 Shortness of breath: Secondary | ICD-10-CM | POA: Diagnosis present

## 2020-03-10 DIAGNOSIS — Z452 Encounter for adjustment and management of vascular access device: Secondary | ICD-10-CM

## 2020-03-10 DIAGNOSIS — Y718 Miscellaneous cardiovascular devices associated with adverse incidents, not elsewhere classified: Secondary | ICD-10-CM | POA: Diagnosis not present

## 2020-03-10 DIAGNOSIS — I472 Ventricular tachycardia: Secondary | ICD-10-CM | POA: Diagnosis not present

## 2020-03-10 DIAGNOSIS — K219 Gastro-esophageal reflux disease without esophagitis: Secondary | ICD-10-CM | POA: Diagnosis present

## 2020-03-10 DIAGNOSIS — Z79899 Other long term (current) drug therapy: Secondary | ICD-10-CM

## 2020-03-10 DIAGNOSIS — E875 Hyperkalemia: Secondary | ICD-10-CM | POA: Diagnosis not present

## 2020-03-10 DIAGNOSIS — Z8249 Family history of ischemic heart disease and other diseases of the circulatory system: Secondary | ICD-10-CM

## 2020-03-10 DIAGNOSIS — Z7952 Long term (current) use of systemic steroids: Secondary | ICD-10-CM

## 2020-03-10 DIAGNOSIS — N390 Urinary tract infection, site not specified: Secondary | ICD-10-CM | POA: Diagnosis present

## 2020-03-10 DIAGNOSIS — E662 Morbid (severe) obesity with alveolar hypoventilation: Secondary | ICD-10-CM | POA: Diagnosis present

## 2020-03-10 DIAGNOSIS — I132 Hypertensive heart and chronic kidney disease with heart failure and with stage 5 chronic kidney disease, or end stage renal disease: Secondary | ICD-10-CM | POA: Diagnosis not present

## 2020-03-10 DIAGNOSIS — Z823 Family history of stroke: Secondary | ICD-10-CM

## 2020-03-10 DIAGNOSIS — D696 Thrombocytopenia, unspecified: Secondary | ICD-10-CM | POA: Diagnosis not present

## 2020-03-10 DIAGNOSIS — I4891 Unspecified atrial fibrillation: Secondary | ICD-10-CM | POA: Diagnosis not present

## 2020-03-10 DIAGNOSIS — F209 Schizophrenia, unspecified: Secondary | ICD-10-CM | POA: Diagnosis present

## 2020-03-10 DIAGNOSIS — I5043 Acute on chronic combined systolic (congestive) and diastolic (congestive) heart failure: Secondary | ICD-10-CM | POA: Diagnosis not present

## 2020-03-10 DIAGNOSIS — Z87442 Personal history of urinary calculi: Secondary | ICD-10-CM

## 2020-03-10 DIAGNOSIS — J90 Pleural effusion, not elsewhere classified: Secondary | ICD-10-CM

## 2020-03-10 DIAGNOSIS — F4310 Post-traumatic stress disorder, unspecified: Secondary | ICD-10-CM | POA: Diagnosis present

## 2020-03-10 DIAGNOSIS — Z91030 Bee allergy status: Secondary | ICD-10-CM

## 2020-03-10 DIAGNOSIS — D539 Nutritional anemia, unspecified: Secondary | ICD-10-CM

## 2020-03-10 DIAGNOSIS — K7200 Acute and subacute hepatic failure without coma: Secondary | ICD-10-CM | POA: Diagnosis not present

## 2020-03-10 DIAGNOSIS — T82838A Hemorrhage of vascular prosthetic devices, implants and grafts, initial encounter: Secondary | ICD-10-CM | POA: Diagnosis not present

## 2020-03-10 DIAGNOSIS — G92 Toxic encephalopathy: Secondary | ICD-10-CM | POA: Diagnosis not present

## 2020-03-10 DIAGNOSIS — Y92239 Unspecified place in hospital as the place of occurrence of the external cause: Secondary | ICD-10-CM | POA: Diagnosis not present

## 2020-03-10 DIAGNOSIS — I071 Rheumatic tricuspid insufficiency: Secondary | ICD-10-CM | POA: Diagnosis present

## 2020-03-10 DIAGNOSIS — I4819 Other persistent atrial fibrillation: Secondary | ICD-10-CM | POA: Diagnosis not present

## 2020-03-10 DIAGNOSIS — J449 Chronic obstructive pulmonary disease, unspecified: Secondary | ICD-10-CM | POA: Diagnosis not present

## 2020-03-10 DIAGNOSIS — R112 Nausea with vomiting, unspecified: Secondary | ICD-10-CM

## 2020-03-10 DIAGNOSIS — E1122 Type 2 diabetes mellitus with diabetic chronic kidney disease: Secondary | ICD-10-CM | POA: Diagnosis present

## 2020-03-10 DIAGNOSIS — M109 Gout, unspecified: Secondary | ICD-10-CM | POA: Diagnosis present

## 2020-03-10 DIAGNOSIS — I350 Nonrheumatic aortic (valve) stenosis: Secondary | ICD-10-CM | POA: Diagnosis present

## 2020-03-10 DIAGNOSIS — I4892 Unspecified atrial flutter: Secondary | ICD-10-CM | POA: Diagnosis not present

## 2020-03-10 DIAGNOSIS — Z888 Allergy status to other drugs, medicaments and biological substances status: Secondary | ICD-10-CM

## 2020-03-10 DIAGNOSIS — I9589 Other hypotension: Secondary | ICD-10-CM | POA: Diagnosis present

## 2020-03-10 DIAGNOSIS — E2740 Unspecified adrenocortical insufficiency: Secondary | ICD-10-CM | POA: Diagnosis not present

## 2020-03-10 DIAGNOSIS — Z7982 Long term (current) use of aspirin: Secondary | ICD-10-CM

## 2020-03-10 DIAGNOSIS — J440 Chronic obstructive pulmonary disease with acute lower respiratory infection: Secondary | ICD-10-CM | POA: Diagnosis present

## 2020-03-10 DIAGNOSIS — Z8673 Personal history of transient ischemic attack (TIA), and cerebral infarction without residual deficits: Secondary | ICD-10-CM

## 2020-03-10 DIAGNOSIS — Z7189 Other specified counseling: Secondary | ICD-10-CM | POA: Diagnosis not present

## 2020-03-10 DIAGNOSIS — Z66 Do not resuscitate: Secondary | ICD-10-CM | POA: Diagnosis present

## 2020-03-10 DIAGNOSIS — I361 Nonrheumatic tricuspid (valve) insufficiency: Secondary | ICD-10-CM | POA: Diagnosis not present

## 2020-03-10 DIAGNOSIS — E7800 Pure hypercholesterolemia, unspecified: Secondary | ICD-10-CM | POA: Diagnosis present

## 2020-03-10 DIAGNOSIS — E785 Hyperlipidemia, unspecified: Secondary | ICD-10-CM | POA: Diagnosis present

## 2020-03-10 DIAGNOSIS — M797 Fibromyalgia: Secondary | ICD-10-CM | POA: Diagnosis present

## 2020-03-10 DIAGNOSIS — Z7901 Long term (current) use of anticoagulants: Secondary | ICD-10-CM

## 2020-03-10 DIAGNOSIS — G40909 Epilepsy, unspecified, not intractable, without status epilepticus: Secondary | ICD-10-CM | POA: Diagnosis present

## 2020-03-10 DIAGNOSIS — J9621 Acute and chronic respiratory failure with hypoxia: Secondary | ICD-10-CM | POA: Diagnosis not present

## 2020-03-10 DIAGNOSIS — Z992 Dependence on renal dialysis: Secondary | ICD-10-CM | POA: Diagnosis not present

## 2020-03-10 DIAGNOSIS — R6521 Severe sepsis with septic shock: Secondary | ICD-10-CM | POA: Diagnosis not present

## 2020-03-10 DIAGNOSIS — I5033 Acute on chronic diastolic (congestive) heart failure: Secondary | ICD-10-CM | POA: Diagnosis not present

## 2020-03-10 DIAGNOSIS — Z86711 Personal history of pulmonary embolism: Secondary | ICD-10-CM

## 2020-03-10 DIAGNOSIS — I340 Nonrheumatic mitral (valve) insufficiency: Secondary | ICD-10-CM | POA: Diagnosis not present

## 2020-03-10 DIAGNOSIS — I10 Essential (primary) hypertension: Secondary | ICD-10-CM | POA: Diagnosis present

## 2020-03-10 DIAGNOSIS — A419 Sepsis, unspecified organism: Secondary | ICD-10-CM | POA: Diagnosis not present

## 2020-03-10 DIAGNOSIS — R569 Unspecified convulsions: Secondary | ICD-10-CM | POA: Diagnosis not present

## 2020-03-10 DIAGNOSIS — A4181 Sepsis due to Enterococcus: Secondary | ICD-10-CM | POA: Diagnosis not present

## 2020-03-10 DIAGNOSIS — Z515 Encounter for palliative care: Secondary | ICD-10-CM | POA: Diagnosis present

## 2020-03-10 DIAGNOSIS — Z9981 Dependence on supplemental oxygen: Secondary | ICD-10-CM

## 2020-03-10 DIAGNOSIS — G20 Parkinson's disease: Secondary | ICD-10-CM | POA: Diagnosis present

## 2020-03-10 DIAGNOSIS — Z9911 Dependence on respirator [ventilator] status: Secondary | ICD-10-CM

## 2020-03-10 DIAGNOSIS — N186 End stage renal disease: Secondary | ICD-10-CM | POA: Diagnosis present

## 2020-03-10 DIAGNOSIS — L89620 Pressure ulcer of left heel, unstageable: Secondary | ICD-10-CM | POA: Diagnosis present

## 2020-03-10 DIAGNOSIS — R748 Abnormal levels of other serum enzymes: Secondary | ICD-10-CM

## 2020-03-10 DIAGNOSIS — Z825 Family history of asthma and other chronic lower respiratory diseases: Secondary | ICD-10-CM

## 2020-03-10 DIAGNOSIS — E11649 Type 2 diabetes mellitus with hypoglycemia without coma: Secondary | ICD-10-CM | POA: Diagnosis not present

## 2020-03-10 DIAGNOSIS — N2581 Secondary hyperparathyroidism of renal origin: Secondary | ICD-10-CM | POA: Diagnosis not present

## 2020-03-10 DIAGNOSIS — B9620 Unspecified Escherichia coli [E. coli] as the cause of diseases classified elsewhere: Secondary | ICD-10-CM | POA: Diagnosis present

## 2020-03-10 DIAGNOSIS — Z6835 Body mass index (BMI) 35.0-35.9, adult: Secondary | ICD-10-CM

## 2020-03-10 DIAGNOSIS — K2270 Barrett's esophagus without dysplasia: Secondary | ICD-10-CM | POA: Diagnosis present

## 2020-03-10 DIAGNOSIS — F319 Bipolar disorder, unspecified: Secondary | ICD-10-CM | POA: Diagnosis present

## 2020-03-10 DIAGNOSIS — Z880 Allergy status to penicillin: Secondary | ICD-10-CM

## 2020-03-10 DIAGNOSIS — Z20822 Contact with and (suspected) exposure to covid-19: Secondary | ICD-10-CM | POA: Diagnosis present

## 2020-03-10 DIAGNOSIS — Z87891 Personal history of nicotine dependence: Secondary | ICD-10-CM

## 2020-03-10 DIAGNOSIS — G219 Secondary parkinsonism, unspecified: Secondary | ICD-10-CM | POA: Diagnosis present

## 2020-03-10 DIAGNOSIS — E1142 Type 2 diabetes mellitus with diabetic polyneuropathy: Secondary | ICD-10-CM | POA: Diagnosis not present

## 2020-03-10 DIAGNOSIS — Z7401 Bed confinement status: Secondary | ICD-10-CM

## 2020-03-10 DIAGNOSIS — Z882 Allergy status to sulfonamides status: Secondary | ICD-10-CM

## 2020-03-10 DIAGNOSIS — E039 Hypothyroidism, unspecified: Secondary | ICD-10-CM | POA: Diagnosis present

## 2020-03-10 DIAGNOSIS — I700 Atherosclerosis of aorta: Secondary | ICD-10-CM | POA: Diagnosis present

## 2020-03-10 DIAGNOSIS — J181 Lobar pneumonia, unspecified organism: Secondary | ICD-10-CM | POA: Diagnosis present

## 2020-03-10 DIAGNOSIS — Z885 Allergy status to narcotic agent status: Secondary | ICD-10-CM

## 2020-03-10 DIAGNOSIS — J4540 Moderate persistent asthma, uncomplicated: Secondary | ICD-10-CM | POA: Diagnosis present

## 2020-03-10 DIAGNOSIS — F202 Catatonic schizophrenia: Secondary | ICD-10-CM | POA: Diagnosis not present

## 2020-03-10 DIAGNOSIS — Z794 Long term (current) use of insulin: Secondary | ICD-10-CM

## 2020-03-10 DIAGNOSIS — R7881 Bacteremia: Secondary | ICD-10-CM | POA: Diagnosis not present

## 2020-03-10 DIAGNOSIS — D62 Acute posthemorrhagic anemia: Secondary | ICD-10-CM | POA: Diagnosis not present

## 2020-03-10 DIAGNOSIS — E559 Vitamin D deficiency, unspecified: Secondary | ICD-10-CM | POA: Diagnosis present

## 2020-03-10 DIAGNOSIS — I120 Hypertensive chronic kidney disease with stage 5 chronic kidney disease or end stage renal disease: Secondary | ICD-10-CM | POA: Diagnosis not present

## 2020-03-10 DIAGNOSIS — E1165 Type 2 diabetes mellitus with hyperglycemia: Secondary | ICD-10-CM | POA: Diagnosis present

## 2020-03-10 DIAGNOSIS — R778 Other specified abnormalities of plasma proteins: Secondary | ICD-10-CM | POA: Diagnosis present

## 2020-03-10 DIAGNOSIS — I770 Arteriovenous fistula, acquired: Secondary | ICD-10-CM | POA: Diagnosis present

## 2020-03-10 DIAGNOSIS — I272 Pulmonary hypertension, unspecified: Secondary | ICD-10-CM | POA: Diagnosis present

## 2020-03-10 DIAGNOSIS — F79 Unspecified intellectual disabilities: Secondary | ICD-10-CM | POA: Diagnosis present

## 2020-03-10 DIAGNOSIS — I21A1 Myocardial infarction type 2: Secondary | ICD-10-CM | POA: Diagnosis present

## 2020-03-10 DIAGNOSIS — B952 Enterococcus as the cause of diseases classified elsewhere: Secondary | ICD-10-CM | POA: Diagnosis not present

## 2020-03-10 DIAGNOSIS — Z79890 Hormone replacement therapy (postmenopausal): Secondary | ICD-10-CM

## 2020-03-10 LAB — GLUCOSE, CAPILLARY
Glucose-Capillary: 258 mg/dL — ABNORMAL HIGH (ref 70–99)
Glucose-Capillary: 270 mg/dL — ABNORMAL HIGH (ref 70–99)
Glucose-Capillary: 238 mg/dL — ABNORMAL HIGH (ref 70–99)

## 2020-03-10 LAB — I-STAT CHEM 8, ED
BUN: 80 mg/dL — ABNORMAL HIGH (ref 8–23)
Calcium, Ion: 1.15 mmol/L (ref 1.15–1.40)
Chloride: 102 mmol/L (ref 98–111)
Creatinine, Ser: 4.2 mg/dL — ABNORMAL HIGH (ref 0.44–1.00)
Glucose, Bld: 420 mg/dL — ABNORMAL HIGH (ref 70–99)
HCT: 36 % (ref 36.0–46.0)
Hemoglobin: 12.2 g/dL (ref 12.0–15.0)
Potassium: 5.8 mmol/L — ABNORMAL HIGH (ref 3.5–5.1)
Sodium: 139 mmol/L (ref 135–145)
TCO2: 25 mmol/L (ref 22–32)

## 2020-03-10 LAB — CBC
HCT: 35.9 % — ABNORMAL LOW (ref 36.0–46.0)
Hemoglobin: 11.2 g/dL — ABNORMAL LOW (ref 12.0–15.0)
MCH: 35.8 pg — ABNORMAL HIGH (ref 26.0–34.0)
MCHC: 31.2 g/dL (ref 30.0–36.0)
MCV: 114.7 fL — ABNORMAL HIGH (ref 80.0–100.0)
Platelets: 138 10*3/uL — ABNORMAL LOW (ref 150–400)
RBC: 3.13 MIL/uL — ABNORMAL LOW (ref 3.87–5.11)
RDW: 16.2 % — ABNORMAL HIGH (ref 11.5–15.5)
WBC: 16.9 10*3/uL — ABNORMAL HIGH (ref 4.0–10.5)
nRBC: 0.1 % (ref 0.0–0.2)

## 2020-03-10 LAB — HEPATIC FUNCTION PANEL
ALT: 330 U/L — ABNORMAL HIGH (ref 0–44)
AST: 432 U/L — ABNORMAL HIGH (ref 15–41)
Albumin: 3.1 g/dL — ABNORMAL LOW (ref 3.5–5.0)
Alkaline Phosphatase: 99 U/L (ref 38–126)
Bilirubin, Direct: 0.7 mg/dL — ABNORMAL HIGH (ref 0.0–0.2)
Indirect Bilirubin: 0.8 mg/dL (ref 0.3–0.9)
Total Bilirubin: 1.5 mg/dL — ABNORMAL HIGH (ref 0.3–1.2)
Total Protein: 5 g/dL — ABNORMAL LOW (ref 6.5–8.1)

## 2020-03-10 LAB — I-STAT VENOUS BLOOD GAS, ED
Acid-Base Excess: 2 mmol/L (ref 0.0–2.0)
Bicarbonate: 26.7 mmol/L (ref 20.0–28.0)
Calcium, Ion: 1.12 mmol/L — ABNORMAL LOW (ref 1.15–1.40)
HCT: 35 % — ABNORMAL LOW (ref 36.0–46.0)
Hemoglobin: 11.9 g/dL — ABNORMAL LOW (ref 12.0–15.0)
O2 Saturation: 99 %
Potassium: 5.9 mmol/L — ABNORMAL HIGH (ref 3.5–5.1)
Sodium: 139 mmol/L (ref 135–145)
TCO2: 28 mmol/L (ref 22–32)
pCO2, Ven: 40.6 mmHg — ABNORMAL LOW (ref 44.0–60.0)
pH, Ven: 7.425 (ref 7.250–7.430)
pO2, Ven: 150 mmHg — ABNORMAL HIGH (ref 32.0–45.0)

## 2020-03-10 LAB — TROPONIN I (HIGH SENSITIVITY)
Troponin I (High Sensitivity): 42 ng/L — ABNORMAL HIGH (ref <18)
Troponin I (High Sensitivity): 56 ng/L — ABNORMAL HIGH (ref <18)

## 2020-03-10 LAB — BASIC METABOLIC PANEL
Anion gap: 15 (ref 5–15)
BUN: 53 mg/dL — ABNORMAL HIGH (ref 8–23)
CO2: 21 mmol/L — ABNORMAL LOW (ref 22–32)
Calcium: 9.4 mg/dL (ref 8.9–10.3)
Chloride: 102 mmol/L (ref 98–111)
Creatinine, Ser: 4.4 mg/dL — ABNORMAL HIGH (ref 0.44–1.00)
GFR calc Af Amer: 11 mL/min — ABNORMAL LOW (ref >60)
GFR calc non Af Amer: 10 mL/min — ABNORMAL LOW (ref >60)
Glucose, Bld: 418 mg/dL — ABNORMAL HIGH (ref 70–99)
Potassium: 6 mmol/L — ABNORMAL HIGH (ref 3.5–5.1)
Sodium: 138 mmol/L (ref 135–145)

## 2020-03-10 LAB — MRSA PCR SCREENING

## 2020-03-10 LAB — BLOOD GAS, ARTERIAL
Acid-base deficit: 0.8 mmol/L (ref 0.0–2.0)
Bicarbonate: 23.9 mmol/L (ref 20.0–28.0)
Drawn by: 28099
FIO2: 32
O2 Saturation: 96.8 %
Patient temperature: 36.1
pCO2 arterial: 41.1 mmHg (ref 32.0–48.0)
pH, Arterial: 7.376 (ref 7.350–7.450)
pO2, Arterial: 93.2 mmHg (ref 83.0–108.0)

## 2020-03-10 LAB — LACTIC ACID, PLASMA
Lactic Acid, Venous: 4.9 mmol/L (ref 0.5–1.9)
Lactic Acid, Venous: 2.7 mmol/L (ref 0.5–1.9)

## 2020-03-10 LAB — HEMOGLOBIN A1C
Hgb A1c MFr Bld: 8 % — ABNORMAL HIGH (ref 4.8–5.6)
Mean Plasma Glucose: 182.9 mg/dL

## 2020-03-10 LAB — FOLATE: Folate: 30.8 ng/mL (ref >5.9)

## 2020-03-10 LAB — AMMONIA: Ammonia: 74 umol/L — ABNORMAL HIGH (ref 9–35)

## 2020-03-10 LAB — HEPATITIS PANEL, ACUTE
HCV Ab: NONREACTIVE
Hep A IgM: NONREACTIVE
Hep B C IgM: NONREACTIVE
Hepatitis B Surface Ag: NONREACTIVE

## 2020-03-10 LAB — POC SARS CORONAVIRUS 2 AG -  ED: SARS Coronavirus 2 Ag: NEGATIVE

## 2020-03-10 LAB — APTT: aPTT: 33 seconds (ref 24–36)

## 2020-03-10 LAB — CBG MONITORING, ED: Glucose-Capillary: 263 mg/dL — ABNORMAL HIGH (ref 70–99)

## 2020-03-10 LAB — PROCALCITONIN: Procalcitonin: 18.11 ng/mL

## 2020-03-10 LAB — TSH: TSH: 2.415 u[IU]/mL (ref 0.350–4.500)

## 2020-03-10 LAB — BRAIN NATRIURETIC PEPTIDE: B Natriuretic Peptide: 1486.7 pg/mL — ABNORMAL HIGH (ref 0.0–100.0)

## 2020-03-10 LAB — MAGNESIUM: Magnesium: 2.3 mg/dL (ref 1.7–2.4)

## 2020-03-10 LAB — VITAMIN B12: Vitamin B-12: 1013 pg/mL — ABNORMAL HIGH (ref 180–914)

## 2020-03-10 LAB — SARS CORONAVIRUS 2 BY RT PCR (HOSPITAL ORDER, PERFORMED IN ~~LOC~~ HOSPITAL LAB): SARS Coronavirus 2: NEGATIVE

## 2020-03-10 LAB — PROTIME-INR
INR: 2.1 — ABNORMAL HIGH (ref 0.8–1.2)
Prothrombin Time: 23 seconds — ABNORMAL HIGH (ref 11.4–15.2)

## 2020-03-10 MED ORDER — HEPARIN (PORCINE) 25000 UT/250ML-% IV SOLN
700.0000 [IU]/h | INTRAVENOUS | Status: DC
  Administered 2020-03-10: 1050 [IU]/h via INTRAVENOUS
  Administered 2020-03-11: 800 [IU]/h via INTRAVENOUS
  Filled 2020-03-10 (×3): qty 250

## 2020-03-10 MED ORDER — AMIODARONE LOAD VIA INFUSION
150.0000 mg | Freq: Once | INTRAVENOUS | Status: AC
  Administered 2020-03-10: 150 mg via INTRAVENOUS
  Filled 2020-03-10: qty 83.34

## 2020-03-10 MED ORDER — ALBUTEROL SULFATE (2.5 MG/3ML) 0.083% IN NEBU: 2.3000 mg | INHALATION_SOLUTION | Freq: Four times a day (QID) | RESPIRATORY_TRACT | Status: DC | PRN

## 2020-03-10 MED ORDER — APIXABAN 5 MG PO TABS
5.0000 mg | ORAL_TABLET | Freq: Two times a day (BID) | ORAL | Status: DC
  Filled 2020-03-10 (×2): qty 1

## 2020-03-10 MED ORDER — CHLORHEXIDINE GLUCONATE CLOTH 2 % EX PADS
6.0000 | MEDICATED_PAD | Freq: Every day | CUTANEOUS | Status: DC
  Administered 2020-03-11 – 2020-03-23 (×11): 6 via TOPICAL

## 2020-03-10 MED ORDER — VANCOMYCIN HCL 750 MG/150ML IV SOLN
750.0000 mg | INTRAVENOUS | Status: DC
  Filled 2020-03-10: qty 150

## 2020-03-10 MED ORDER — BUPROPION HCL ER (XL) 150 MG PO TB24
150.0000 mg | ORAL_TABLET | Freq: Every day | ORAL | Status: DC
  Administered 2020-03-11: 150 mg via ORAL
  Filled 2020-03-10: qty 1

## 2020-03-10 MED ORDER — CALCIUM GLUCONATE 10 % IV SOLN: 1.0000 g | Freq: Once | INTRAVENOUS | Status: DC

## 2020-03-10 MED ORDER — METOPROLOL SUCCINATE ER 100 MG PO TB24: 200.0000 mg | ORAL_TABLET | Freq: Every day | ORAL | Status: DC

## 2020-03-10 MED ORDER — AMIODARONE HCL IN DEXTROSE 360-4.14 MG/200ML-% IV SOLN
30.0000 mg/h | INTRAVENOUS | Status: DC
  Administered 2020-03-11 – 2020-03-17 (×13): 30 mg/h via INTRAVENOUS
  Filled 2020-03-10 (×13): qty 200

## 2020-03-10 MED ORDER — ARIPIPRAZOLE 2 MG PO TABS
2.0000 mg | ORAL_TABLET | Freq: Every day | ORAL | Status: DC
  Administered 2020-03-11 – 2020-03-26 (×16): 2 mg via ORAL
  Filled 2020-03-10 (×20): qty 1

## 2020-03-10 MED ORDER — SODIUM CHLORIDE 0.9 % IV SOLN
2.0000 g | Freq: Once | INTRAVENOUS | Status: DC
  Filled 2020-03-10: qty 2

## 2020-03-10 MED ORDER — PRISMASOL BGK 0/2.5 32-2.5 MEQ/L IV SOLN
INTRAVENOUS | Status: DC
  Filled 2020-03-10 (×13): qty 5000

## 2020-03-10 MED ORDER — DULOXETINE HCL 60 MG PO CPEP
60.0000 mg | ORAL_CAPSULE | Freq: Every day | ORAL | Status: DC
  Filled 2020-03-10: qty 1

## 2020-03-10 MED ORDER — NITROGLYCERIN 0.4 MG SL SUBL: 0.4000 mg | SUBLINGUAL_TABLET | SUBLINGUAL | Status: DC | PRN

## 2020-03-10 MED ORDER — CALCITRIOL 0.25 MCG PO CAPS
0.2500 ug | ORAL_CAPSULE | ORAL | Status: DC
  Administered 2020-03-12 – 2020-03-25 (×7): 0.25 ug via ORAL
  Filled 2020-03-10 (×7): qty 1

## 2020-03-10 MED ORDER — SODIUM CHLORIDE 0.9 % IV SOLN: 1.0000 g | INTRAVENOUS | Status: DC

## 2020-03-10 MED ORDER — GABAPENTIN 100 MG PO CAPS: 100.0000 mg | ORAL_CAPSULE | Freq: Three times a day (TID) | ORAL | Status: DC

## 2020-03-10 MED ORDER — DEXTROMETHORPHAN-GUAIFENESIN 5-100 MG/5ML PO LIQD: 5.0000 mL | Freq: Every day | ORAL | Status: DC | PRN

## 2020-03-10 MED ORDER — SODIUM CHLORIDE 0.9 % IV SOLN
250.0000 mL | INTRAVENOUS | Status: DC
  Administered 2020-03-17: 250 mL via INTRAVENOUS

## 2020-03-10 MED ORDER — PRISMASOL BGK 4/2.5 32-4-2.5 MEQ/L REPLACEMENT SOLN: Status: DC

## 2020-03-10 MED ORDER — VANCOMYCIN HCL 2000 MG/400ML IV SOLN
2000.0000 mg | Freq: Once | INTRAVENOUS | Status: AC
  Administered 2020-03-10: 2000 mg via INTRAVENOUS
  Filled 2020-03-10 (×2): qty 400

## 2020-03-10 MED ORDER — PANTOPRAZOLE SODIUM 40 MG PO TBEC
40.0000 mg | DELAYED_RELEASE_TABLET | Freq: Every day | ORAL | Status: DC
  Administered 2020-03-11 – 2020-03-26 (×16): 40 mg via ORAL
  Filled 2020-03-10 (×16): qty 1

## 2020-03-10 MED ORDER — METRONIDAZOLE IN NACL 5-0.79 MG/ML-% IV SOLN
500.0000 mg | Freq: Once | INTRAVENOUS | Status: AC
  Administered 2020-03-10: 500 mg via INTRAVENOUS
  Filled 2020-03-10: qty 100

## 2020-03-10 MED ORDER — CALCIUM ACETATE (PHOS BINDER) 667 MG PO CAPS
2001.0000 mg | ORAL_CAPSULE | Freq: Three times a day (TID) | ORAL | Status: DC
  Administered 2020-03-11 – 2020-03-25 (×34): 2001 mg via ORAL
  Filled 2020-03-10 (×35): qty 3

## 2020-03-10 MED ORDER — ONDANSETRON HCL 4 MG/2ML IJ SOLN
4.0000 mg | Freq: Four times a day (QID) | INTRAMUSCULAR | Status: DC | PRN
  Administered 2020-03-11 – 2020-03-24 (×4): 4 mg via INTRAVENOUS
  Filled 2020-03-10 (×4): qty 2

## 2020-03-10 MED ORDER — VANCOMYCIN HCL IN DEXTROSE 1-5 GM/200ML-% IV SOLN: 1000.0000 mg | Freq: Once | INTRAVENOUS | Status: DC

## 2020-03-10 MED ORDER — ATORVASTATIN CALCIUM 10 MG PO TABS
20.0000 mg | ORAL_TABLET | Freq: Every day | ORAL | Status: DC
  Administered 2020-03-11 – 2020-03-13 (×3): 20 mg via ORAL
  Filled 2020-03-10 (×3): qty 2

## 2020-03-10 MED ORDER — PHENYLEPHRINE HCL-NACL 10-0.9 MG/250ML-% IV SOLN
0.0000 ug/min | INTRAVENOUS | Status: DC
  Administered 2020-03-11 (×2): 170 ug/min via INTRAVENOUS
  Administered 2020-03-11 (×2): 200 ug/min via INTRAVENOUS
  Filled 2020-03-10 (×3): qty 250

## 2020-03-10 MED ORDER — SODIUM CHLORIDE 0.9 % IV SOLN
2.0000 g | Freq: Once | INTRAVENOUS | Status: AC
  Administered 2020-03-10: 2 g via INTRAVENOUS
  Filled 2020-03-10: qty 2

## 2020-03-10 MED ORDER — AMIODARONE HCL IN DEXTROSE 360-4.14 MG/200ML-% IV SOLN
60.0000 mg/h | INTRAVENOUS | Status: DC
  Administered 2020-03-10 (×2): 60 mg/h via INTRAVENOUS
  Filled 2020-03-10 (×2): qty 200

## 2020-03-10 MED ORDER — INSULIN ASPART 100 UNIT/ML ~~LOC~~ SOLN
0.0000 [IU] | Freq: Every day | SUBCUTANEOUS | Status: DC
  Administered 2020-03-10: 2 [IU] via SUBCUTANEOUS
  Administered 2020-03-11: 3 [IU] via SUBCUTANEOUS

## 2020-03-10 MED ORDER — DILTIAZEM HCL-DEXTROSE 125-5 MG/125ML-% IV SOLN (PREMIX): 5.0000 mg/h | INTRAVENOUS | Status: DC

## 2020-03-10 MED ORDER — INSULIN ASPART 100 UNIT/ML ~~LOC~~ SOLN
0.0000 [IU] | Freq: Three times a day (TID) | SUBCUTANEOUS | Status: DC
  Administered 2020-03-10 – 2020-03-11 (×2): 8 [IU] via SUBCUTANEOUS
  Administered 2020-03-11: 2 [IU] via SUBCUTANEOUS
  Administered 2020-03-12: 15 [IU] via SUBCUTANEOUS
  Administered 2020-03-12: 3 [IU] via SUBCUTANEOUS
  Administered 2020-03-12: 8 [IU] via SUBCUTANEOUS
  Administered 2020-03-13 (×3): 5 [IU] via SUBCUTANEOUS
  Administered 2020-03-14: 3 [IU] via SUBCUTANEOUS
  Administered 2020-03-14: 2 [IU] via SUBCUTANEOUS
  Administered 2020-03-14 – 2020-03-15 (×2): 8 [IU] via SUBCUTANEOUS

## 2020-03-10 MED ORDER — VANCOMYCIN VARIABLE DOSE PER UNSTABLE RENAL FUNCTION (PHARMACIST DOSING): Status: DC

## 2020-03-10 MED ORDER — TRAZODONE HCL 50 MG PO TABS: 50.0000 mg | ORAL_TABLET | Freq: Every day | ORAL | Status: DC

## 2020-03-10 MED ORDER — DILTIAZEM HCL-DEXTROSE 125-5 MG/125ML-% IV SOLN (PREMIX)
5.0000 mg/h | INTRAVENOUS | Status: DC
  Administered 2020-03-10: 5 mg/h via INTRAVENOUS
  Filled 2020-03-10: qty 125

## 2020-03-10 MED ORDER — CALCIUM GLUCONATE-NACL 1-0.675 GM/50ML-% IV SOLN
1.0000 g | Freq: Once | INTRAVENOUS | Status: AC
  Administered 2020-03-10: 1000 mg via INTRAVENOUS
  Filled 2020-03-10: qty 50

## 2020-03-10 MED ORDER — LEVOTHYROXINE SODIUM 50 MCG PO TABS
50.0000 ug | ORAL_TABLET | Freq: Every day | ORAL | Status: DC
  Administered 2020-03-12 – 2020-03-26 (×14): 50 ug via ORAL
  Filled 2020-03-10 (×15): qty 1

## 2020-03-10 MED ORDER — PREDNISONE 10 MG PO TABS
10.0000 mg | ORAL_TABLET | Freq: Every day | ORAL | Status: DC
  Administered 2020-03-11 – 2020-03-12 (×2): 10 mg via ORAL
  Filled 2020-03-10 (×2): qty 1

## 2020-03-10 MED ORDER — CALCIUM CARBONATE ANTACID 500 MG PO CHEW: 1.0000 | CHEWABLE_TABLET | Freq: Four times a day (QID) | ORAL | Status: DC | PRN

## 2020-03-10 MED ORDER — ASPIRIN EC 81 MG PO TBEC
81.0000 mg | DELAYED_RELEASE_TABLET | Freq: Every day | ORAL | Status: DC
  Administered 2020-03-11 – 2020-03-26 (×15): 81 mg via ORAL
  Filled 2020-03-10 (×15): qty 1

## 2020-03-10 MED ORDER — LEVETIRACETAM 250 MG PO TABS
250.0000 mg | ORAL_TABLET | Freq: Two times a day (BID) | ORAL | Status: DC
  Administered 2020-03-11 – 2020-03-26 (×30): 250 mg via ORAL
  Filled 2020-03-10 (×38): qty 1

## 2020-03-10 MED ORDER — HEPARIN SODIUM (PORCINE) 1000 UNIT/ML DIALYSIS
1000.0000 [IU] | INTRAMUSCULAR | Status: DC | PRN
  Administered 2020-03-11: 4000 [IU] via INTRAVENOUS_CENTRAL
  Administered 2020-03-12: 3000 [IU] via INTRAVENOUS_CENTRAL
  Filled 2020-03-10: qty 4
  Filled 2020-03-10 (×3): qty 6

## 2020-03-10 MED ORDER — MONTELUKAST SODIUM 10 MG PO TABS
10.0000 mg | ORAL_TABLET | Freq: Every day | ORAL | Status: DC
  Administered 2020-03-11 – 2020-03-25 (×15): 10 mg via ORAL
  Filled 2020-03-10 (×16): qty 1

## 2020-03-10 MED ORDER — SODIUM ZIRCONIUM CYCLOSILICATE 10 G PO PACK
10.0000 g | PACK | Freq: Two times a day (BID) | ORAL | Status: DC
  Filled 2020-03-10: qty 1

## 2020-03-10 MED ORDER — PRIMIDONE 50 MG PO TABS
50.0000 mg | ORAL_TABLET | Freq: Every day | ORAL | Status: DC
  Filled 2020-03-10 (×2): qty 1

## 2020-03-10 MED ORDER — PHENYLEPHRINE HCL-NACL 10-0.9 MG/250ML-% IV SOLN
25.0000 ug/min | INTRAVENOUS | Status: DC
  Administered 2020-03-10: 190 ug/min via INTRAVENOUS
  Administered 2020-03-10: 150 ug/min via INTRAVENOUS
  Administered 2020-03-10: 190 ug/min via INTRAVENOUS
  Administered 2020-03-10: 180 ug/min via INTRAVENOUS
  Filled 2020-03-10: qty 750
  Filled 2020-03-10: qty 250

## 2020-03-10 MED ORDER — PHENYLEPHRINE HCL-NACL 10-0.9 MG/250ML-% IV SOLN
INTRAVENOUS | Status: AC
  Administered 2020-03-10: 10 ug/min via INTRAVENOUS
  Filled 2020-03-10: qty 250

## 2020-03-10 MED ORDER — ACETAMINOPHEN 325 MG PO TABS: 650.0000 mg | ORAL_TABLET | ORAL | Status: DC | PRN

## 2020-03-10 MED ORDER — SODIUM CHLORIDE 0.9 % IV SOLN
2.0000 g | Freq: Two times a day (BID) | INTRAVENOUS | Status: DC
  Administered 2020-03-11: 2 g via INTRAVENOUS
  Filled 2020-03-10 (×2): qty 2

## 2020-03-10 NOTE — ED Provider Notes (Addendum)
[name] Provider Note   CSN: [id number] Arrival date & time:        History Chief Complaint  Patient presents with  . Shortness of Breath    [name] is a 69 y.o. female extensive past medical history including ESRD dialysis Tuesday Thursday Saturday, CHF, COPD, obesity, hypertension, hyperlipidemia diabetes, seizures, Parkinson's.  Patient arrives via EMS today.  Per EMS they were called out for hyperglycemia and tachycardia.  When they arrived patient appeared short of breath and endorsed 2-day history of chest pain and shortness of breath.  Patient was found to be in A. fib RVR, she received diltiazem in route with some improvement.  She is normally on 3 L oxygen via nasal cannula, she denies have any hypoxia but she was placed on a nonrebreather due to tachypnea.  Per EMS facility was not able to provide much additional history due to recent shift change, unsure last dialysis session.  On evaluation patient is lethargic, will arouse to voice reports chest pain and trouble breathing, no other current concerns.  Alert to self only.  Speech clear, follows basic commands with symmetric strength bilaterally.  Level 5 caveat altered mental status.  HPI     Past Medical History:  Diagnosis Date  . Anemia   . Angina pectoris ([place])   . Anxiety   . Asthma   . Atrial flutter ([place])   . Back pain   . Barrett esophagus   . Bifascicular block   . BMI 39.0-39.9,adult   . Cellulitis   . CHF (congestive heart failure) ([place])   . Chronic diastolic CHF (congestive heart failure) ([place])   . Constipation   . Contact with and (suspected) exposure to covid-19   . COPD (chronic obstructive pulmonary disease) ([place])   . Dependence on renal dialysis ([place])   . Depression   . Dysphagia   . Dysuria   . Epilepsy ([place])   . ESRD on hemodialysis ([place])   . Essential hypertension   . Fibromyalgia   . Fibromyalgia   . GERD (gastroesophageal reflux  disease)   . Gout   . History of pericarditis   . Hypercholesterolemia   . Hyperlipidemia   . Hypothyroid   . Hypothyroidism   . Insomnia   . Long term (current) use of insulin ([place])   . Major depressive disorder   . Memory loss   . Moderate protein-calorie malnutrition ([place])   . Morbid obesity ([place])   . Nephrolithiasis    2008  . Obesity hypoventilation syndrome (HCC)    CPAP  . Obstructive sleep apnea   . Pain in left arm   . PE (pulmonary embolism)    2010  . Peripheral neuropathy   . Persistent atrial fibrillation ([place])   . Pneumonia   . Polyp of nasal sinus   . PTSD (post-traumatic stress disorder)   . Pulmonary hypertension ([place])   . Rheumatoid arteritis ([place])   . Rheumatoid arthritis ([place])   . Schizophrenia ([place])   . 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 Seizures ([place])   . 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 Tachycardia, unspecified   . Type 2 diabetes mellitus ([place])   . Unspecified intellectual disabilities   . 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 Urinary tract infection   . Vitamin D deficiency     Patient Active Problem List   Diagnosis Date Noted  . Atrial fibrillation with RVR (  [place]) 03/10/2020  . Macrocytic anemia 03/10/2020  . Thrombocytopenia ([place]) 03/10/2020  . Hyperkalemia 03/10/2020  . Sepsis ([place]) 03/10/2020  . Elevated liver enzymes 03/10/2020  . Respiratory failure with hypoxia ([place]) 01/24/2020  . Hypoxia 01/23/2020  . Acute on chronic respiratory failure with hypoxia ([place]) 01/23/2020  . Persistent atrial fibrillation ([place])   . Cellulitis and abscess of right leg 11/23/2019  . ESRD (end stage renal disease) ([place]) 11/23/2019  . Elevated troponin   . Atrial fibrillation with rapid ventricular response ([place]) 11/22/2019  . Acute pulmonary edema (HCC)   . Acute respiratory failure with hypoxia and hypercapnia ([place]) 09/10/2019  . SVT (supraventricular tachycardia) ([place]) 09/10/2019  . On  mechanically assisted ventilation ([place]) 09/08/2019  . Acute metabolic encephalopathy 10/93/2355  . Volume overload 09/06/2019  . AKI (acute kidney injury) ([place]) 09/06/2019  . Altered mental state 11/28/2018  . Near syncope 11/28/2018  . Fall at home 11/28/2018  . History of seizure 11/28/2018  . 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 Pressure injury of skin 09/17/2018  . Respiratory failure ([place]) 08/16/2018  . Seizure ([place]) 08/16/2018  . Acute encephalopathy 07/27/2018  . Endotracheally intubated 07/27/2018  . Hypothyroidism 07/27/2018  . Pleuritic chest pain 07/14/2018  . Moderate persistent asthma without complication   . Type 2 diabetes mellitus ([place]) 04/19/2018  . Acute kidney injury superimposed on chronic kidney disease ([place]) 01/13/2018  . Altered mental status   . Encephalopathy 10/11/2016  . Chest pain 08/12/2016  . CKD (chronic kidney disease), stage IV ([place]) 08/12/2016  . History of pulmonary embolus (PE) 08/12/2016  . Obesity, Class III, BMI 40-49.9 (morbid obesity) ([place]) 08/12/2016  . RBBB 08/12/2016  . Positive D dimer 08/12/2016  . Cerebral infarction ([place]) 11/10/2015  . Physical deconditioning 07/01/2011  . Weakness 07/01/2011  . Diabetic foot ulcer ([place]) 05/19/2011  . Polypharmacy 02/09/2011  . BACK PAIN WITH RADICULOPATHY 05/25/2010  . Type 2 diabetes mellitus with diabetic polyneuropathy, without long-term current use of insulin ([place]) 08/18/2009  . Acute on chronic diastolic CHF (congestive heart failure) ([place]) 04/25/2009  . Normocytic anemia 04/14/2009  . Sleep apnea 12/11/2008  . Diabetic peripheral neuropathy ([place]) 05/04/2007  . HYPERCHOLESTEROLEMIA 10/13/2006  . Gout, unspecified 10/13/2006  . HYPOKALEMIA 10/13/2006  . Schizophrenia ([place]) 10/13/2006  . Depression 10/13/2006  . Essential hypertension 10/13/2006  . Venous (peripheral) insufficiency 10/13/2006  . RHINITIS, ALLERGIC 10/13/2006  . Asthma 10/13/2006  . Reflux esophagitis  10/13/2006  . OSTEOARTHRITIS, MULTI SITES 10/13/2006  . INCONTINENCE, URGE 10/13/2006    Past Surgical History:  Procedure Laterality Date  . A/V FISTULAGRAM N/A 01/28/2020   Procedure: A/V FISTULAGRAM - Right [place];  Surgeon: Waynetta Sandy, MD;  Location: [facility];  Service: Cardiovascular;  Laterality: N/A;  . APPENDECTOMY    . AV FISTULA PLACEMENT Right 09/27/2019   Procedure: ARTERIOVENOUS (AV) FISTULA CREATION;  Surgeon: Waynetta Sandy, MD;  Location: [facility];  Service: Vascular;  Laterality: Right;  . AV FISTULA PLACEMENT Right 02/20/2020   Procedure: INSERTION OF ARTERIOVENOUS (AV) GORE-TEX GRAFT ARM;  Surgeon: Waynetta Sandy, MD;  Location: [facility];  Service: Vascular;  Laterality: Right;  . CHOLECYSTECTOMY    . COLONOSCOPY     benign polyps (12/2000), repeat colonoscopy performed in 2007  . ENDOMETRIAL BIOPSY     10/03 benign columnar mucosa (limited material)  . INSERTION OF DIALYSIS CATHETER Right 09/14/2019   Procedure: ATTEMPTED INSERTION OF DIALYSIS CATHETER;  Surgeon: Virl Cagey, MD;  [facility];  Service: General;  Laterality: Right;  . INSERTION OF DIALYSIS CATHETER Right 09/27/2019   Procedure: INSERTION OF DIALYSIS CATHETER;  Surgeon: Waynetta Sandy, MD;  Location: [facility];  Service: Vascular;  Laterality: Right;  . KIDNEY SURGERY    . REVISON OF ARTERIOVENOUS FISTULA Right 02/20/2020   Procedure: REVISON OF ARTERIOVENOUS FISTULA;  Surgeon: Waynetta Sandy, MD;  Location: [facility];  Service: Vascular;  Laterality: Right;     OB History   No obstetric history on file.     Family History  Problem Relation Age of Onset  . Bone cancer Mother   . Hypertension Mother   . Heart disease Mother   . COPD Father   . Heart disease Father   . Stroke Father     Social History   Tobacco Use  . Smoking status: Former Smoker    Types: Cigarettes    Quit date: 06/23/1972    Years since quitting: 47.7  .  Smokeless tobacco: Never Used  Vaping Use  . Vaping Use: Never used  Substance Use Topics  . Alcohol use: No  . Drug use: No    Home Medications Prior to Admission medications   Medication Sig Start Date End Date Taking? Authorizing Provider  acetaminophen (TYLENOL) 500 MG tablet Take 500 mg by mouth 2 (two) times daily. At 0800 and 2000   Yes [provider]  albuterol (VENTOLIN HFA) 108 (90 Base) MCG/ACT inhaler Inhale 1 puff into the lungs every 6 (six) hours as needed for wheezing or shortness of breath.   Yes [provider]  Amino Acids (AMINO ACID PROTEIN PO) Take 30 mLs by mouth 2 (two) times daily. At 0800 and 2000   Yes [provider]  apixaban (ELIQUIS) 5 MG TABS tablet Take 1 tablet (5 mg total) by mouth 2 (two) times daily. Patient taking differently: Take 5 mg by mouth 2 (two) times daily. At 0800 and 2000 10/01/19  Yes Shelly Coss, MD  ARIPiprazole (ABILIFY) 2 MG tablet Take 2 mg by mouth daily. At 0900 12/10/19  Yes [provider]  aspirin EC 81 MG tablet Take 1 tablet (81 mg total) by mouth daily with breakfast. Patient taking differently: Take 81 mg by mouth daily with breakfast. At 0800 01/25/20  Yes Emokpae, Courage, MD  atorvastatin (LIPITOR) 20 MG tablet Take 20 mg by mouth at bedtime. At 2000 05/14/19  Yes [provider]  buPROPion (WELLBUTRIN XL) 150 MG 24 hr tablet Take 150 mg by mouth daily. At 0800   Yes [provider]  calcitRIOL (ROCALTROL) 0.25 MCG capsule Take 1 capsule (0.25 mcg total) by mouth every Monday, Wednesday, and Friday. Patient taking differently: Take 0.25 mcg by mouth every Monday, Wednesday, and Friday. At 0800 10/01/19  Yes Shelly Coss, MD  Calcium Acetate 667 MG TABS Take 2,001 mg by mouth 3 (three) times daily with meals. At 0800, 1200, and 1600   Yes [provider]  calcium carbonate (TUMS - DOSED IN MG ELEMENTAL CALCIUM) 500 MG chewable tablet Chew 1 tablet by mouth every  6 (six) hours as needed for indigestion or heartburn.   Yes [provider]  cetaphil (CETAPHIL) lotion Apply 1 application topically every 12 (twelve) hours as needed for dry skin. Apply to feet/legs   Yes [provider]  Cholecalciferol (VITAMIN D3) 50 MCG (2000 UT) TABS Take 4,000 Units by mouth daily. At 0800   Yes [provider]  Dextromethorphan-guaiFENesin 5-100 MG/5ML LIQD Take  5 mLs by mouth daily as needed (cough).   Yes [provider]  diltiazem (CARDIZEM CD) 360 MG 24 hr capsule Take 360 mg by mouth daily. At 0800   Yes [provider]  Dulaglutide (TRULICITY) 1.5 HW/2.9HB SOPN Inject 1.5 mg into the skin every Friday. At 0800   Yes [provider]  DULoxetine (CYMBALTA) 60 MG capsule Take 60 mg by mouth daily. At 0800 05/11/19  Yes [provider]  EPINEPHrine (EPI-PEN) 0.3 mg/0.3 mL DEVI Inject 0.3 mg into the muscle once as needed (for bee stings, then go to the ED immediately after using).    Yes [provider]  gabapentin (NEURONTIN) 100 MG capsule Take 1 capsule (100 mg total) by mouth 3 (three) times daily. Patient taking differently: Take 100 mg by mouth 3 (three) times daily. At 0800, 1200, and 1700 10/01/19  Yes Adhikari, Tamsen Meek, MD  insulin aspart (NOVOLOG) 100 UNIT/ML injection Inject 10 Units into the skin 3 (three) times daily before meals. At 0800, 1200, and 1700 Additional units according to sliding scale 151-200 = 2 units 201-250 = 4 units 251-300 = 6 units 301-350 = 8 units 351-400 = 10units 401-450 = 12units >400 = call MD   Yes [provider]  levETIRAcetam (KEPPRA) 250 MG tablet Take 250 mg by mouth 2 (two) times daily. At 0800 and 2000   Yes [provider]  levothyroxine (SYNTHROID) 50 MCG tablet Take 50 mcg by mouth daily before breakfast. At 0800   Yes [provider]  metoprolol succinate (TOPROL-XL) 200 MG 24 hr tablet Take 1 tablet (200 mg total) by mouth  daily. Take with or immediately following a meal. Patient taking differently: Take 200 mg by mouth daily. Take with or immediately following a meal. At 0800 11/27/19  Yes Tat, Shanon Brow, MD  montelukast (SINGULAIR) 10 MG tablet Take 10 mg by mouth at bedtime. At 2000   Yes [provider]  Multiple Vitamin (DAILY VITE PO) Take 1 tablet by mouth daily. At 0800   Yes [provider]  nitroGLYCERIN (NITROSTAT) 0.4 MG SL tablet Place 0.4 mg under the tongue every 5 (five) minutes x 3 doses as needed for chest pain.  01/13/20  Yes [provider]  omeprazole (PRILOSEC) 20 MG capsule Take 20 mg by mouth in the morning. At 0600   Yes [provider]  ondansetron (ZOFRAN) 4 MG tablet Take 4 mg by mouth every 6 (six) hours as needed for nausea or vomiting.   Yes [provider]  predniSONE (DELTASONE) 10 MG tablet Take 10 mg by mouth daily. At 0800   Yes [provider]  primidone (MYSOLINE) 50 MG tablet Take 50 mg by mouth daily. At 0800   Yes [provider]  pyridoxine (B-6) 200 MG tablet Take 200 mg by mouth in the morning and at bedtime. At 0800 and 2000   Yes [provider]  senna (SENOKOT) 8.6 MG tablet Take 1 tablet by mouth 2 (two) times daily. At 0800 and 2000   Yes [provider]  traZODone (DESYREL) 50 MG tablet Take 50 mg by mouth at bedtime. At 2000 05/11/19  Yes [provider]  traMADol (ULTRAM) 50 MG tablet Take 1 tablet (50 mg total) by mouth every 8 (eight) hours as needed. Patient not taking: Reported on 03/10/2020 02/20/20   Gabriel Earing, PA-C    Allergies    Benztropine mesylate, Codeine, Diazepam, Diphenhydramine hcl, Penicillins, Sulfonamide derivatives, Thioridazine hcl, Bee  venom, and Lisinopril  Review of Systems   Review of Systems  Unable to perform ROS: Mental status change    Physical Exam Updated Vital Signs BP 108/79   Pulse (!) 33   Temp 99.5 F (37.5 C) (Oral)   Resp (!) 28    SpO2 95%   Physical Exam Constitutional:      General: She is in acute distress.     Appearance: Normal appearance. She is well-developed. She is obese. She is ill-appearing. She is not diaphoretic.  HENT:     Head: Normocephalic and atraumatic.  Eyes:     General: Vision grossly intact. Gaze aligned appropriately.     Pupils: Pupils are equal, round, and reactive to light.  Neck:     Trachea: Trachea and phonation normal.  Cardiovascular:     Rate and Rhythm: Tachycardia present. Rhythm irregular.  Pulmonary:     Effort: Pulmonary effort is normal. Tachypnea present. No respiratory distress.     Comments: Coarse lung sounds bilaterally Abdominal:     General: There is no distension.     Palpations: Abdomen is soft.     Tenderness: There is no abdominal tenderness. There is no guarding or rebound.  Musculoskeletal:        General: Normal range of motion.     Cervical back: Normal range of motion.     Right lower leg: No edema.     Left lower leg: No edema.  Skin:    General: Skin is warm and dry.  Neurological:     Mental Status: She is lethargic.     GCS: GCS eye subscore is 4. GCS verbal subscore is 5. GCS motor subscore is 6.     Comments: Speech is clear, follows commands  Major Cranial nerves without deficit, no facial droop Moves extremities without ataxia, coordination intact  Psychiatric:        Behavior: Behavior normal.     ED Results / Procedures / Treatments   Labs (all labs ordered are listed, but only abnormal results are displayed) Labs Reviewed  BASIC METABOLIC PANEL - Abnormal; Notable for the following components:      Result Value   Potassium 6.0 (*)    CO2 21 (*)    Glucose, Bld 418 (*)    BUN 53 (*)    Creatinine, Ser 4.40 (*)    GFR calc non Af Amer 10 (*)    GFR calc Af Amer 11 (*)    All other components within normal limits  CBC - Abnormal; Notable for the following components:   WBC 16.9 (*)    RBC 3.13 (*)    Hemoglobin 11.2 (*)      HCT 35.9 (*)    MCV 114.7 (*)    MCH 35.8 (*)    RDW 16.2 (*)    Platelets 138 (*)    All other components within normal limits  BRAIN NATRIURETIC PEPTIDE - Abnormal; Notable for the following components:   B Natriuretic Peptide 1,486.7 (*)    All other components within normal limits  LACTIC ACID, PLASMA - Abnormal; Notable for the following components:   Lactic Acid, Venous 4.9 (*)    All other components within normal limits  PROTIME-INR - Abnormal; Notable for the following components:   Prothrombin Time 23.0 (*)    INR 2.1 (*)    All other components within normal limits  HEPATIC FUNCTION PANEL - Abnormal; Notable for the following components:   Total Protein 5.0 (*)  Albumin 3.1 (*)    AST 432 (*)    ALT 330 (*)    Total Bilirubin 1.5 (*)    Bilirubin, Direct 0.7 (*)    All other components within normal limits  I-STAT CHEM 8, ED - Abnormal; Notable for the following components:   Potassium 5.8 (*)    BUN 80 (*)    Creatinine, Ser 4.20 (*)    Glucose, Bld 420 (*)    All other components within normal limits  I-STAT VENOUS BLOOD GAS, ED - Abnormal; Notable for the following components:   pCO2, Ven 40.6 (*)    pO2, Ven 150.0 (*)    Potassium 5.9 (*)    Calcium, Ion 1.12 (*)    HCT 35.0 (*)    Hemoglobin 11.9 (*)    All other components within normal limits  TROPONIN I (HIGH SENSITIVITY) - Abnormal; Notable for the following components:   Troponin I (High Sensitivity) 42 (*)    All other components within normal limits  SARS CORONAVIRUS 2 BY RT PCR (HOSPITAL ORDER, [facility])  CULTURE, BLOOD (ROUTINE X 2)  CULTURE, BLOOD (ROUTINE X 2)  URINE CULTURE  APTT  LACTIC ACID, PLASMA  URINALYSIS, ROUTINE W REFLEX MICROSCOPIC  MAGNESIUM  TSH  PROCALCITONIN  VITAMIN B12  FOLATE  HEMOGLOBIN A1C  POC SARS CORONAVIRUS 2 AG -  ED  TROPONIN I (HIGH SENSITIVITY)    EKG None  Radiology DG Chest Port 1 View  Result Date:  03/10/2020 CLINICAL DATA:  Short of breath, chest pain EXAM: PORTABLE CHEST 1 VIEW COMPARISON:  03/07/2020 FINDINGS: Cardiac enlargement with vascular congestion and mild interstitial edema. Mild right lower lobe patchy airspace disease and small right effusion. Left jugular central venous catheter tip in the SVC unchanged. No pneumothorax. IMPRESSION: Cardiac enlargement with mild progression of vascular congestion and probable interstitial edema. Small right effusion. Mild progression of right lower lobe airspace disease. Electronically Signed   By: Franchot Gallo M.D.   On: 03/10/2020 10:47    Procedures .Critical Care Performed by: Deliah Boston, PA-C Authorized by: Deliah Boston, PA-C   Critical care provider statement:    Critical care time (minutes):  60   Critical care was time spent personally by me on the following activities:  Discussions with consultants, evaluation of patient's response to treatment, examination of patient, ordering and performing treatments and interventions, ordering and review of laboratory studies, ordering and review of radiographic studies, pulse oximetry, re-evaluation of patient's condition, obtaining history from patient or surrogate, review of old charts and development of treatment plan with patient or surrogate   (including critical care time)  Medications Ordered in ED Medications  metroNIDAZOLE (FLAGYL) IVPB 500 mg (has no administration in time range)  vancomycin (VANCOREADY) IVPB 2000 mg/400 mL (has no administration in time range)  ceFEPIme (MAXIPIME) 1 g in sodium chloride 0.9 % 100 mL IVPB (has no administration in time range)  vancomycin variable dose per unstable renal function (pharmacist dosing) (has no administration in time range)  acetaminophen (TYLENOL) tablet 650 mg (has no administration in time range)  ondansetron (ZOFRAN) injection 4 mg (has no administration in time range)  diltiazem (CARDIZEM) 125 mg in dextrose 5% 125 mL  (1 mg/mL) infusion (5 mg/hr Intravenous New Bag/Given 03/10/20 1244)  insulin aspart (novoLOG) injection 0-15 Units (has no administration in time range)  insulin aspart (novoLOG) injection 0-5 Units (has no administration in time range)  aspirin EC tablet 81 mg (has  no administration in time range)  atorvastatin (LIPITOR) tablet 20 mg (has no administration in time range)  metoprolol succinate (TOPROL-XL) 24 hr tablet 200 mg (has no administration in time range)  nitroGLYCERIN (NITROSTAT) SL tablet 0.4 mg (has no administration in time range)  ARIPiprazole (ABILIFY) tablet 2 mg (has no administration in time range)  buPROPion (WELLBUTRIN XL) 24 hr tablet 150 mg (has no administration in time range)  DULoxetine (CYMBALTA) DR capsule 60 mg (has no administration in time range)  traZODone (DESYREL) tablet 50 mg (has no administration in time range)  calcitRIOL (ROCALTROL) capsule 0.25 mcg (has no administration in time range)  levothyroxine (SYNTHROID) tablet 50 mcg (has no administration in time range)  predniSONE (DELTASONE) tablet 10 mg (has no administration in time range)  Calcium Acetate TABS 2,001 mg (has no administration in time range)  calcium carbonate (TUMS - dosed in mg elemental calcium) chewable tablet 200 mg of elemental calcium (has no administration in time range)  pantoprazole (PROTONIX) EC tablet 40 mg (has no administration in time range)  apixaban (ELIQUIS) tablet 5 mg (has no administration in time range)  gabapentin (NEURONTIN) capsule 100 mg (has no administration in time range)  levETIRAcetam (KEPPRA) tablet 250 mg (has no administration in time range)  primidone (MYSOLINE) tablet 50 mg (has no administration in time range)  albuterol (VENTOLIN HFA) 108 (90 Base) MCG/ACT inhaler 1 puff (has no administration in time range)  Dextromethorphan-guaiFENesin 5-100 MG/5ML LIQD 5 mL (has no administration in time range)  montelukast (SINGULAIR) tablet 10 mg (has no  administration in time range)  calcium gluconate 1 g/ 50 mL sodium chloride IVPB (0 g Intravenous Stopped 03/10/20 1206)  ceFEPIme (MAXIPIME) 2 g in sodium chloride 0.9 % 100 mL IVPB (0 g Intravenous Stopped 03/10/20 1217)    ED Course  I have reviewed the triage vital signs and the nursing notes.  Pertinent labs & imaging results that were available during my care of the patient were reviewed by me and considered in my medical decision making (see chart for details).  Clinical Course as of Mar 10 1245  Mon Mar 10, 2020  1019 [name] (Other)  [phone] [facility])  No answer   [BM]  [place] No answer   [BM]  [phone] No answer   [BM]  13 68 yo female presenting to ED with tachycardic, tachypnea from nursing facility.  Patient appears confused and cannot provide additional history.  EMS reported A Fib vs SVT en route with a run of V-Tach.  They gave diltiazem bolus en route.  Pt ona rrival has HR 130's and irregular, BP stable, no hypoxia, RR 30.  She has coarse breath sounds.  From her records it appears she has gone to her regular dialysis on Tues, Thurs, Sat (checked off on paperwork) but we've been unable to reach family or facility yet.  Plan to give IV calcium gluconate for hyperK, discuss with cardiology regarding V Tach, discuss with nephrology   [MT]  [place]   [BM]  4076 With WBC elevation, tachycardia, temp 99.5 we'll initiate a sepsis w/u and start BS antibiotics - no clear source at this time.   [MT]  1123 Dr. Joelyn Oms   [BM]  1135 Potassium(!): 6.0 [MT]    Clinical Course User Index [BM] Deliah Boston, PA-C [MT] Langston Masker Carola Rhine, MD   MDM Rules/Calculators/A&P  Additional history obtained from: 1. Nursing notes from this visit. 2. EMS. 3. Electronic Medical Records. 4. Paper records from nursing facility. ---------------------- 69 year old female history as detailed above presents today  from nursing facility altered tachycardic tachypneic on 15 L nasal cannula from her regular 3 L nasal cannula.  She is in A. fib RVR improved with diltiazem.  She also has been having brief runs of 5-6 beat V. tach seen on strips brought by EMS.  Lab work ordered including [employer].  Placed placed on cardiac pads.  She has no asymmetry or unilateral weakness to suggest stroke.  She has clear speech but is lethargic, follows commands.  Unclear when patient's last dialysis session was.  I attempted multiple times to reach patient's family members as well as the nursing facility however there was no answer.  The paperwork brought by EMS shows patient may have received dialysis yesterday?  Patient was seen and evaluated by Dr. Langston Masker, empiric calcium gluconate ordered. - I-STAT Chem-8 resulted showing potassium of 5.8 and elevated creatinine and BUN up from prior. VBG also showed hyperkalemia, pH within normal limits.  I spoke with Trish from cardiology at 11:02 AM, she advised that cardiology will round on patient but not admit. - 15-minute Covid test negative. BNP elevated at 1486. BMP shows hyperkalemia 6.0, again shows elevation of BUN/creatinine, no gap. CBC shows new leukocytosis of 16.9, hemoglobin appears baseline at 11.2. High-sensitivity troponin of 42, possibly secondary to demand will obtain delta.  CXR:  IMPRESSION:  Cardiac enlargement with mild progression of vascular congestion and  probable interstitial edema. Small right effusion. Mild progression  of right lower lobe airspace disease.  ------------------- Given new leukocytosis, tachycardia, low-grade fever and tachypnea code sepsis was initiated.  Broad-spectrum antibiotics for unknown source have been ordered.  Avoided fluid bolus given suspected fluid overload and pulmonary edema.  Patient reassessed remains stable, was de-escalate to nasal canula, remains tachycardic with rate low 100s, no hypoxia blood pressure  stable.  I then spoke with nephrologist Dr. Joelyn Oms at 11:23 AM, advised that he will be by to see patient to evaluate need for dialysis. - Case was discussed with Dr. Doristine Bosworth at 12:24 PM, patient accepted to hospitalist service.  [name] was evaluated in Emergency Department on 03/10/2020 for the symptoms described in the history of present illness. She was evaluated in the context of the global COVID-19 pandemic, which necessitated consideration that the patient might be at risk for infection with the SARS-CoV-2 virus that causes COVID-19. Institutional protocols and algorithms that pertain to the evaluation of patients at risk for COVID-19 are in a state of rapid change based on information released by regulatory bodies including the CDC and federal and state organizations. These policies and algorithms were followed during the patient's care in the ED. ----------------------------------- Addendum: Patient's lactic acid resulted elevated at 4.9 at 12:00 PM. Patient was not hypotensive, blood pressure at that time was 114/77. Patient did not appear to be in septic shock. Broad-spectrum antibiotics had been initiated due to leukocytosis and tachycardia but there was no clear source at that time. Due to patient's evidence of fluid overload and hypoxia the 30 cc/kg fluid bolus had been avoided  to avoid worsening of pulmonary edema and respiratory status. Lactic acidosis thought possible secondary to tachyarrhythemia and increased work of breathing because of fluid overload.   Note: Portions of this report may have been transcribed using voice recognition software. Every effort was made to ensure accuracy; however, inadvertent  computerized transcription errors may still be present. Final Clinical Impression(s) / ED Diagnoses Final diagnoses:  Hyperkalemia  Shortness of breath    Rx / DC Orders ED Discharge Orders    None         Gari Crown 03/10/20 1247    Wyvonnia Dusky, MD 03/10/20 1739    Deliah Boston, PA-C 03/12/20 1256    Wyvonnia Dusky, MD 03/12/20 [phone]

## 2020-03-10 NOTE — Significant Event (Signed)
Rapid Response Event Note  Overview: Time Called: 1615 Arrival Time: 1615 Event Type: Respiratory, Neurologic, Cardiac  Initial Focused Assessment: Patient just arrived from Emergency Department. She is lethargic but able to follow commands.   Belly breathing,  Lung sounds diminished She is pale and cool to the touch RAF 120-130 on cardizem gtt @5  BP 97/58  RR 28  O2 sat 97% on 3 L [name]   Interventions: Stopped Cardizem 150mg  bolus Amiodarone Amiodarone gtt started ABG done  Patient less responsive than and hour ago Dr Gwenlyn Found and Dr Elsworth Soho have been at bedside to assess patient. BP 73/36  AF 119  Orders received to start neo gtt Transferred to 2H09 Staff at bedside upon arrival  Plan of Care (if not transferred):  Event Summary: Name of Physician Notified: Early Osmond at    Name of Consulting Physician Notified: Dr Gwenlyn Found Cardiology, Dr Elsworth Soho CCM at    Outcome: Transferred (Comment)  Event End Time: [place]  [name]

## 2020-03-10 NOTE — Procedures (Signed)
Central Venous Catheter Insertion Procedure Note  KELIN NIXON  [id number]  1950/09/15  Date:03/10/20  Time:11:29 PM   Provider Performing:Jaylee Freeze Shearon Stalls   Procedure: Insertion of Non-tunneled Central Venous Catheter(36556) without US guidance  Indication(s) Medication administration and Difficult access  Consent Unable to obtain consent due to emergent nature of procedure.  Anesthesia Topical only with 1% lidocaine   Timeout Verified patient identification, verified procedure, site/side was marked, verified correct patient position, special equipment/implants available, medications/allergies/relevant history reviewed, required imaging and test results available.  Sterile Technique Maximal sterile technique including full sterile barrier drape, hand hygiene, sterile gown, sterile gloves, mask, hair covering, sterile ultrasound probe cover (if used).  Procedure Description Area of catheter insertion was cleaned with chlorhexidine and draped in sterile fashion.  Without real-time ultrasound guidance a central venous catheter was placed into the right subclavian vein. Nonpulsatile blood flow and easy flushing noted in all ports.  The catheter was sutured in place and sterile dressing applied.  Complications/Tolerance None; patient tolerated the procedure well. Chest X-ray is ordered to verify placement for internal jugular or subclavian cannulation.   Chest x-ray is not ordered for femoral cannulation.  EBL Minimal  Specimen(s) None   Montey Hora, [place] 03/10/2020, 11:30 PM

## 2020-03-10 NOTE — ED Notes (Addendum)
D/t lethargy pt unable to take pills by mouth at this time. MD Pahwani made aware, at bedside to assess pt. Per MD Pahwani, hold PO meds until more alert. No IV meds ordered in place at this time, will continue to monitor.

## 2020-03-10 NOTE — Progress Notes (Signed)
eLink Physician-Brief Progress Note Patient Name: [name] DOB: February 20, 1951 MRN: [id number]   Date of Service  03/10/2020  HPI/Events of Note  Hypotension - No has CVL in good position.  eICU Interventions  Plan: 1. Phenylephrine IV infusion. Titrate to MAP >= 65. 2. Monitor CVP now and Q 4 hours.     Intervention Category Major Interventions: Hypotension - evaluation and management  Surie Suchocki [place] 03/10/2020, 11:59 PM

## 2020-03-10 NOTE — Progress Notes (Signed)
ANTICOAGULATION CONSULT NOTE - Initial Consult  Pharmacy Consult for heparin Indication: atrial fibrillation  Allergies  Allergen Reactions  . Benztropine Mesylate Other (See Comments)    Alopecia and white spots on eyes  . Codeine Swelling, Palpitations and Other (See Comments)    Mouth Swelling and Tachycardia   . Diazepam Other (See Comments)    COMA  . Diphenhydramine Hcl Anxiety, Palpitations and Other (See Comments)    Tachycardia and anxiousness  . Penicillins Swelling  . Sulfonamide Derivatives Rash    Blisters, also  . Thioridazine Hcl Swelling  . Bee Venom     Documented on MAR  . Lisinopril Cough    Patient Measurements:    Vital Signs: Temp: 97.1 F (36.2 C) (07/26 1616) Temp Source: Oral (07/26 1616) BP: 97/75 (07/26 1616) Pulse Rate: 114 (07/26 1540)  Labs: Recent Labs    03/10/20 1032 03/10/20 1032 03/10/20 1048 03/10/20 1050 03/10/20 1112 03/10/20 1323  HGB 11.2*   < > 12.2 11.9*  --   --   HCT 35.9*  --  36.0 35.0*  --   --   PLT 138*  --   --   --   --   --   APTT  --   --   --   --  33  --   LABPROT  --   --   --   --  23.0*  --   INR  --   --   --   --  2.1*  --   CREATININE 4.40*  --  4.20*  --   --   --   TROPONINIHS 42*  --   --   --   --  56*   < > = values in this interval not displayed.    Estimated Creatinine Clearance: 13.4 mL/min (A) (by C-G formula based on SCr of 4.2 mg/dL (H)).  Assessment: 69 yo f presenting with Afib RVR - on apixaban PTA  Held per cards.  Adding dilt and amio  INR on admit 2.1 - patient is HD and on apixaban  CBC stable  Goal of Therapy:  Heparin level 0.3-0.7 units/ml aPTT 66 - 102 seconds Monitor platelets by anticoagulation protocol: Yes   Plan:  Dc apixaban Heparin gtt 1050 units/hr 0200 aptt hep lvl Daily hep lvl cbc  Barth Kirks, PharmD, BCPS, BCCCP Clinical Pharmacist [phone]  Please check AMION for all [place] numbers  03/10/2020 5:20 PM

## 2020-03-10 NOTE — ED Notes (Signed)
Pt w/ episode of hypotension, pressure 80/50. Pt is pale, cool, diaphoretic. Placed in trendelenburg w/out improvement of pressure. Dilt running at 7.5, rate 145. MD Pahwani paged, will down titrate diltiazem to 5.

## 2020-03-10 NOTE — Consult Note (Signed)
NAME:  [name], MRN:  [id number], DOB:  05-05-1951, LOS: 0 ADMISSION DATE:  03/10/2020, CONSULTATION DATE:  7/26 REFERRING MD:  pahwani, CHIEF COMPLAINT:  afib RVR   Brief History   57 yof admitted to [facility] on 03/10/20 for afib with RVR, decompensated heart failure and septic shock. She developed progressive hypotension and PCCM was consulted for admission to ICU for pressor support.  History of present illness   69 yof with numerous comorbidities as stated below. Presented from SNF for atrial fibrillation with HR in the 170s. History obtained from chart review due to patient's critical illness. As stated above, she presented via EMS from SNF for heart rates in the 170s in afib RVR. Started on diltiazem drip by EMS however upon arrival to the ED, HR remained elevated and diltiazem drip was increased. HR improved with increased rate however she developed progressive hypotension and was transitioned over to amiodarone. Due to continued hypotension, PCCM was consulted for transfer to ICU for pressor support. Additionally, developed respiratory failure. Workup in the ED revealed an elevated white count and CXR concerning for pna.    Past Medical History  HFpEF, persistent atrial fibrillation, T2DM, RA, hypothyroidism, COPD, ESRD on HD, seizure disorder  Significant Hospital Events   7/26 admission  Consults:  PCCM Cardiology Nephrology  Procedures:  n/a  Significant Diagnostic Tests:  7/26 CXR>> poor inspiratory effort. Right pleural effusion. Fluid appreciable in fissures.  Micro Data:  7/26 blood cultures>>  Antimicrobials:  Vancomycin 7/26>> Cefepime 7/26>>  Interim history/subjective:  n/a  Objective   Blood pressure 97/75, pulse (!) 114, temperature (!) 97.1 F (36.2 C), temperature source Oral, resp. rate (!) 28, SpO2 97 %.        Intake/Output Summary (Last 24 hours) at 03/10/2020 1742 Last data filed at 03/10/2020 1356 Gross per 24 hour  Intake 243.58 ml   Output -  Net 243.58 ml   There were no vitals filed for this visit.  Examination: General: acutely ill appearing female HENT: [place]. MMM Lungs: abdominal breathing on 3L [place] with O2sats in upper 90s. Lung sounds diminished R>L.  Cardiovascular: tachycardic rate. Extremities cold to touch. +2 edema in the lower extremities. JVD difficult to assess.  Abdomen: soft. Extremities: cold Neuro: opens eyes to voice. Intermittently follows commands.  Skin: no rash  Resolved Hospital Problem list   n/a  Assessment & Plan:   Cardiogenic/septic shock. Initially presented in afib RVR and received diltiazem drip which improved rate but with subsequent development of hypotension. Transitioned to amiodarone drip however pressures remain low. Cold and wet on exam.  HFpEF. Echo from April indicating EF 65-70% with moderate PA hypertension. Severe biatrial dilation.  Mild-mod aortic stenosis. Question if aortic stenosis has not progressed since then, resulting in low output heart failure with diltiazem gtt. BNP 1500 on admission Persistent Atrial Fibrillation with RVR. On eliquis at home. HR in the 170s on presentation. Now fairly well rate controlled on amiodarone with HR in the 120s while in the room. Bifasicular block Tropinemia likely attributable to type II NSTEMI Plan: management per cardiology.  -Would continue amiodarone drip -start phenylephrine if needed for pressor support to titrate MAPs>65 -obtain echocardiogram -continue eliquis for anticoagulation -tele monitoring, daily weights  Acute hypoxic respiratory failure 2/2 pulm edema vs pneumonia with chronic right pleural effusion. Currently only on 3L supplemental oxygen but with abdominal breathing. Blood gas values are wnl however she appears to be chronically hypercapnic in the setting of hx of COPD making  me question if she is not getting fatigued. PE considered but unlikely with chronic eliquis use. Chronic right pleural effusion.  Previously noted on imaging so do not suspect this to be the source of respiratory failure and is likely 2/2 heart failure however could consider thoracentesis if increased concern for infection to worsening respiratory status evolves. COPD Plan: trial of BiPAP to improve pulm edema. No indication for intubation at this time. nebulizers as ordered. Continue singulair  Septic shock secondary to  RUL pneumonia. procalc elevated at 18 on admission however would expect it to be elevated in the setting of renal disease and heart failure. cxr quality makes interpretation of infiltrate vs edema difficult but in the setting of elevated white count and hypotenision, would think empiric treatment is reasonable.  Plan -continue cefepime and vanc -follow blood cultures  Acute toxic/metabolic encephalopathy. Reportedly following commands and responding appropriately earlier in the day but seems to have deteriorated.  Plan: NPO for now. Suspect mental status should improve with resolution of above issues.  ESRD on HD. Hemodynamics precluding iHD. Already hyperkalemic. If no improvement by tomorrow, may need to consider CRRT. Nephrology on board so will defer to them on that.  Transaminitis. AST 432; ALT 330. Suspect to be attributable to liver shock with hypotension/heart failure.  Plan: f/u RUQ [place] ordered by primary team. Continue to montitor. Avoid hepatotoxic drugs.  T2DM. Hyperglycemic on admission. Continue SSI and adjust accordingly to CBGs  Hypothyroidisms. Continue synthroid  Rheumatoid arthritis: continue prednisone 10mg  daily.  Seizure disorder: continue keppra  Best practice:  Diet: NPO Pain/Anxiety/Delirium protocol (if indicated): n/a VAP protocol (if indicated): n/a DVT prophylaxis: eliquis GI prophylaxis: n/a Glucose control: SSI Mobility: BR Code Status: Full Family Communication: n/a Disposition: ICU  Labs   CBC: Recent Labs  Lab 03/07/20 0044 03/10/20 1032 03/10/20  1048 03/10/20 1050  WBC 9.3 16.9*  --   --   NEUTROABS 7.0  --   --   --   HGB 9.8* 11.2* 12.2 11.9*  HCT 29.7* 35.9* 36.0 35.0*  MCV 110.8* 114.7*  --   --   PLT 169 138*  --   --     Basic Metabolic Panel: Recent Labs  Lab 03/07/20 0044 03/10/20 1032 03/10/20 1048 03/10/20 1050 03/10/20 1323  NA 137 138 139 139  --   K 3.0* 6.0* 5.8* 5.9*  --   CL 101 102 102  --   --   CO2 28 21*  --   --   --   GLUCOSE 255* 418* 420*  --   --   BUN 24* 53* 80*  --   --   CREATININE 2.20* 4.40* 4.20*  --   --   CALCIUM 8.5* 9.4  --   --   --   MG  --   --   --   --  2.3   GFR: Estimated Creatinine Clearance: 13.4 mL/min (A) (by C-G formula based on SCr of 4.2 mg/dL (H)). Recent Labs  Lab 03/07/20 0044 03/10/20 1032 03/10/20 1112 03/10/20 1323  PROCALCITON  --   --   --  18.11  WBC 9.3 16.9*  --   --   LATICACIDVEN  --   --  4.9* 2.7*    Liver Function Tests: Recent Labs  Lab 03/10/20 1112  AST 432*  ALT 330*  ALKPHOS 99  BILITOT 1.5*  PROT 5.0*  ALBUMIN 3.1*   No results for input(s): LIPASE, AMYLASE in the last 168 hours.  No results for input(s): AMMONIA in the last 168 hours.  ABG    Component Value Date/Time   PHART 7.376 03/10/2020 1719   PCO2ART 41.1 03/10/2020 1719   PO2ART 93.2 03/10/2020 1719   HCO3 23.9 03/10/2020 1719   TCO2 28 03/10/2020 1050   ACIDBASEDEF 0.8 03/10/2020 1719   O2SAT 96.8 03/10/2020 1719     Coagulation Profile: Recent Labs  Lab 03/10/20 1112  INR 2.1*    Cardiac Enzymes: No results for input(s): CKTOTAL, CKMB, CKMBINDEX, TROPONINI in the last 168 hours.  HbA1C: Hgb A1c MFr Bld  Date/Time Value Ref Range Status  03/10/2020 01:23 PM 8.0 (H) 4.8 - 5.6 % Final    Comment:    (NOTE) Pre diabetes:          5.7%-6.4%  Diabetes:              >6.4%  Glycemic control for   <7.0% adults with diabetes   11/24/2019 04:41 AM 5.6 4.8 - 5.6 % Final    Comment:    (NOTE)         Prediabetes: 5.7 - 6.4         Diabetes: >6.4          Glycemic control for adults with diabetes: <7.0     CBG: Recent Labs  Lab 03/10/20 1542 03/10/20 1620  GLUCAP 263* 258*    Review of Systems:   Unable to obtain due to critical illness  Past Medical History  She,  has a past medical history of Anemia, Angina pectoris ([place]), Anxiety, Asthma, Atrial flutter ([place]), Back pain, Barrett esophagus, Bifascicular block, BMI 39.0-39.9,adult, Cellulitis, CHF (congestive heart failure) (HCC), Chronic diastolic CHF (congestive heart failure) ([place]), Constipation, Contact with and (suspected) exposure to covid-19, COPD (chronic obstructive pulmonary disease) ([place]), Dependence on renal dialysis ([place]), Depression, Dysphagia, Dysuria, Epilepsy ([place]), ESRD on hemodialysis ([place]), Essential hypertension, Fibromyalgia, Fibromyalgia, GERD (gastroesophageal reflux disease), Gout, History of pericarditis, Hypercholesterolemia, Hyperlipidemia, Hypothyroid, Hypothyroidism, Insomnia, Long term (current) use of insulin ([place]), Major depressive disorder, Memory loss, Moderate protein-calorie malnutrition ([place]), Morbid obesity ([place]), Nephrolithiasis, Obesity hypoventilation syndrome ([place]), Obstructive sleep apnea, Pain in left arm, PE (pulmonary embolism), Peripheral neuropathy, Persistent atrial fibrillation ([place]), Pneumonia, Polyp of nasal sinus, PTSD (post-traumatic stress disorder), Pulmonary hypertension (HCC), Rheumatoid arteritis ([place]), Rheumatoid arthritis ([place]), Schizophrenia ([place]), 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   Surgical History    Past Surgical History:  Procedure Laterality Date  . A/V FISTULAGRAM N/A 01/28/2020   Procedure: A/V FISTULAGRAM - Right [place];  Surgeon:  Waynetta Sandy, MD;  Location: [facility];  Service: Cardiovascular;  Laterality: N/A;  . APPENDECTOMY    . AV FISTULA PLACEMENT Right 09/27/2019   Procedure: ARTERIOVENOUS (AV) FISTULA CREATION;  Surgeon: Waynetta Sandy, MD;  Location: [facility];  Service: Vascular;  Laterality: Right;  . AV FISTULA PLACEMENT Right 02/20/2020   Procedure: INSERTION OF ARTERIOVENOUS (AV) GORE-TEX GRAFT ARM;  Surgeon: Waynetta Sandy, MD;  Location: [facility];  Service: Vascular;  Laterality: Right;  . CHOLECYSTECTOMY    . COLONOSCOPY     benign polyps (12/2000), repeat colonoscopy performed in 2007  . ENDOMETRIAL BIOPSY     10/03 benign columnar mucosa (limited material)  . INSERTION OF DIALYSIS CATHETER Right 09/14/2019   Procedure: ATTEMPTED INSERTION  OF DIALYSIS CATHETER;  Surgeon: Virl Cagey, MD;  Location: [facility];  Service: General;  Laterality: Right;  . INSERTION OF DIALYSIS CATHETER Right 09/27/2019   Procedure: INSERTION OF DIALYSIS CATHETER;  Surgeon: Waynetta Sandy, MD;  Location: [facility];  Service: Vascular;  Laterality: Right;  . KIDNEY SURGERY    . REVISON OF ARTERIOVENOUS FISTULA Right 02/20/2020   Procedure: REVISON OF ARTERIOVENOUS FISTULA;  Surgeon: Waynetta Sandy, MD;  Location: [facility];  Service: Vascular;  Laterality: Right;     Social History   reports that she quit smoking about 47 years ago. Her smoking use included cigarettes. She has never used smokeless tobacco. She reports that she does not drink alcohol and does not use drugs.   Family History   Her family history includes Bone cancer in her mother; COPD in her father; Heart disease in her father and mother; Hypertension in her mother; Stroke in her father.   Allergies Allergies  Allergen Reactions  . Benztropine Mesylate Other (See Comments)    Alopecia and white spots on eyes  . Codeine Swelling, Palpitations and Other (See Comments)    Mouth Swelling and Tachycardia    . Diazepam Other (See Comments)    COMA  . Diphenhydramine Hcl Anxiety, Palpitations and Other (See Comments)    Tachycardia and anxiousness  . Penicillins Swelling  . Sulfonamide Derivatives Rash    Blisters, also  . Thioridazine Hcl Swelling  . Bee Venom     Documented on MAR  . Lisinopril Cough     Home Medications  Prior to Admission medications   Medication Sig Start Date End Date Taking? Authorizing Provider  acetaminophen (TYLENOL) 500 MG tablet Take 500 mg by mouth 2 (two) times daily. At 0800 and 2000   Yes [provider]  albuterol (VENTOLIN HFA) 108 (90 Base) MCG/ACT inhaler Inhale 1 puff into the lungs every 6 (six) hours as needed for wheezing or shortness of breath.   Yes [provider]  Amino Acids (AMINO ACID PROTEIN PO) Take 30 mLs by mouth 2 (two) times daily. At 0800 and 2000   Yes [provider]  apixaban (ELIQUIS) 5 MG TABS tablet Take 1 tablet (5 mg total) by mouth 2 (two) times daily. Patient taking differently: Take 5 mg by mouth 2 (two) times daily. At 0800 and 2000 10/01/19  Yes Shelly Coss, MD  ARIPiprazole (ABILIFY) 2 MG tablet Take 2 mg by mouth daily. At 0900 12/10/19  Yes [provider]  aspirin EC 81 MG tablet Take 1 tablet (81 mg total) by mouth daily with breakfast. Patient taking differently: Take 81 mg by mouth daily with breakfast. At 0800 01/25/20  Yes Emokpae, Courage, MD  atorvastatin (LIPITOR) 20 MG tablet Take 20 mg by mouth at bedtime. At 2000 05/14/19  Yes [provider]  buPROPion (WELLBUTRIN XL) 150 MG 24 hr tablet Take 150 mg by mouth daily. At 0800   Yes [provider]  calcitRIOL (ROCALTROL) 0.25 MCG capsule Take 1 capsule (0.25 mcg total) by mouth every Monday, Wednesday, and Friday. Patient taking differently: Take 0.25 mcg by mouth every Monday, Wednesday, and Friday. At 0800 10/01/19  Yes Shelly Coss, MD  Calcium Acetate 667 MG TABS Take 2,001 mg by mouth 3 (three) times  daily with meals. At 0800, 1200, and 1600   Yes [provider]  calcium carbonate (TUMS - DOSED IN MG ELEMENTAL CALCIUM) 500 MG chewable tablet Chew 1 tablet by mouth every 6 (  six) hours as needed for indigestion or heartburn.   Yes [provider]  cetaphil (CETAPHIL) lotion Apply 1 application topically every 12 (twelve) hours as needed for dry skin. Apply to feet/legs   Yes [provider]  Cholecalciferol (VITAMIN D3) 50 MCG (2000 UT) TABS Take 4,000 Units by mouth daily. At 0800   Yes [provider]  Dextromethorphan-guaiFENesin 5-100 MG/5ML LIQD Take 5 mLs by mouth daily as needed (cough).   Yes [provider]  diltiazem (CARDIZEM CD) 360 MG 24 hr capsule Take 360 mg by mouth daily. At 0800   Yes [provider]  Dulaglutide (TRULICITY) 1.5 VO/5.3GU SOPN Inject 1.5 mg into the skin every Friday. At 0800   Yes [provider]  DULoxetine (CYMBALTA) 60 MG capsule Take 60 mg by mouth daily. At 0800 05/11/19  Yes [provider]  EPINEPHrine (EPI-PEN) 0.3 mg/0.3 mL DEVI Inject 0.3 mg into the muscle once as needed (for bee stings, then go to the ED immediately after using).    Yes [provider]  gabapentin (NEURONTIN) 100 MG capsule Take 1 capsule (100 mg total) by mouth 3 (three) times daily. Patient taking differently: Take 100 mg by mouth 3 (three) times daily. At 0800, 1200, and 1700 10/01/19  Yes Adhikari, Tamsen Meek, MD  insulin aspart (NOVOLOG) 100 UNIT/ML injection Inject 10 Units into the skin 3 (three) times daily before meals. At 0800, 1200, and 1700 Additional units according to sliding scale 151-200 = 2 units 201-250 = 4 units 251-300 = 6 units 301-350 = 8 units 351-400 = 10units 401-450 = 12units >400 = call MD   Yes [provider]  levETIRAcetam (KEPPRA) 250 MG tablet Take 250 mg by mouth 2 (two) times daily. At 0800 and 2000   Yes [provider]  levothyroxine (SYNTHROID) 50 MCG  tablet Take 50 mcg by mouth daily before breakfast. At 0800   Yes [provider]  metoprolol succinate (TOPROL-XL) 200 MG 24 hr tablet Take 1 tablet (200 mg total) by mouth daily. Take with or immediately following a meal. Patient taking differently: Take 200 mg by mouth daily. Take with or immediately following a meal. At 0800 11/27/19  Yes Tat, Shanon Brow, MD  montelukast (SINGULAIR) 10 MG tablet Take 10 mg by mouth at bedtime. At 2000   Yes [provider]  Multiple Vitamin (DAILY VITE PO) Take 1 tablet by mouth daily. At 0800   Yes [provider]  nitroGLYCERIN (NITROSTAT) 0.4 MG SL tablet Place 0.4 mg under the tongue every 5 (five) minutes x 3 doses as needed for chest pain.  01/13/20  Yes [provider]  omeprazole (PRILOSEC) 20 MG capsule Take 20 mg by mouth in the morning. At 0600   Yes [provider]  ondansetron (ZOFRAN) 4 MG tablet Take 4 mg by mouth every 6 (six) hours as needed for nausea or vomiting.   Yes [provider]  predniSONE (DELTASONE) 10 MG tablet Take 10 mg by mouth daily. At 0800   Yes [provider]  primidone (MYSOLINE) 50 MG tablet Take 50 mg by mouth daily. At 0800   Yes [provider]  pyridoxine (B-6) 200 MG tablet Take 200 mg by mouth in the morning and at bedtime. At 0800 and 2000   Yes [provider]  senna (SENOKOT) 8.6 MG tablet Take 1 tablet by mouth 2 (two) times daily. At 0800 and 2000   Yes [provider]  traZODone (DESYREL) 50  MG tablet Take 50 mg by mouth at bedtime. At 2000 05/11/19  Yes [provider]  traMADol (ULTRAM) 50 MG tablet Take 1 tablet (50 mg total) by mouth every 8 (eight) hours as needed. Patient not taking: Reported on 03/10/2020 02/20/20   Gabriel Earing, PA-C     Mitzi Hansen, MD INTERNAL MEDICINE RESIDENT PGY-2 03/10/20  5:42 PM

## 2020-03-10 NOTE — H&P (Addendum)
History and Physical    [name] SNK:[id number] DOB: 1951-02-28 DOA: 03/10/2020  PCP: Glenda Chroman, MD  Patient coming from: [facility]   I have personally briefly reviewed patient's old medical records in [place]  Chief Complaint: Shortness of breath and elevated blood glucose.  HPI: [name] is a 69 y.o. female with medical history significant of chronic hypoxemic respiratory failure secondary to underlying COPD-on 3 L of oxygen via nasal cannula, paroxysmal A. fib-on Eliquis, ESRD on hemodialysis (TTS), diastolic CHF, moderate AS, hypertension, hyperlipidemia, diabetes mellitus, obstructive sleep apnea/OHS, rheumatoid arthritis, seizure disorder, depression/anxiety, bipolar disorder presents to emergency department with shortness of breath and elevated blood glucose.  History gathered from charts.  Apparently EMS was called by the SNF staff this morning for the concern of hyperglycemia and tachycardia.  Upon arrival of EMS: Patient appeared short of breath.  Patient was found to be in A. fib with RVR with heart rate in 170s with some runs of V. tach.  She received diltiazem in route to ER.  She was placed on nonrebreather due to difficulty in breathing.  Blood glucose was noted to be 520 she was given 22 units of insulin by SNF staff prior to EMS arrival.    Patient reports shortness of breath and chest pain since 2 days.  Reports being compliant with dialysis appointment.  Has chronic bilateral lower extremity swelling, she denies headache, blurry vision, palpitation, fever, chills, nausea, vomiting, abdominal pain, bowel changes.  She tells me that she does not make urine.  No history of smoking, alcohol, illicit drug use.  ED Course: Upon arrival to ED: Patient tachycardic, tachypneic, placed on 3 L of oxygen via nasal cannula, WBC: 16.9, macrocytic anemia, platelet: 138, troponin: 48, CMP shows potassium of 6.0, BNP: 1486, lactic acid: 4.9, INR: 2.1.   COVID-19 negative.  AST: 432, ALT: 330.  UA, UC, BC: Pending.  Chest x-ray shows signs of fluid overload.  Patient received Vanco, cefepime, Flagyl and calcium gluconate in ED.  EDP consulted nephrology and cardiology.  [facility] hospitalist consulted for admission for acute on chronic diastolic CHF and A. fib with RVR.  Review of Systems: As per HPI otherwise negative.    Past Medical History:  Diagnosis Date  . Anemia   . Angina pectoris ([place])   . Anxiety   . Asthma   . Atrial flutter ([place])   . Back pain   . Barrett esophagus   . Bifascicular block   . BMI 39.0-39.9,adult   . Cellulitis   . CHF (congestive heart failure) ([place])   . Chronic diastolic CHF (congestive heart failure) ([place])   . Constipation   . Contact with and (suspected) exposure to covid-19   . COPD (chronic obstructive pulmonary disease) ([place])   . Dependence on renal dialysis ([place])   . Depression   . Dysphagia   . Dysuria   . Epilepsy ([place])   . ESRD on hemodialysis ([place])   . Essential hypertension   . Fibromyalgia   . Fibromyalgia   . GERD (gastroesophageal reflux disease)   . Gout   . History of pericarditis   . Hypercholesterolemia   . Hyperlipidemia   . Hypothyroid   . Hypothyroidism   . Insomnia   . Long term (current) use of insulin ([place])   . Major depressive disorder   . Memory loss   . Moderate protein-calorie malnutrition ([place])   . Morbid obesity ([place])   . Nephrolithiasis  2008  . Obesity hypoventilation syndrome (HCC)    CPAP  . Obstructive sleep apnea   . Pain in left arm   . PE (pulmonary embolism)    2010  . Peripheral neuropathy   . Persistent atrial fibrillation ([place])   . Pneumonia   . Polyp of nasal sinus   . PTSD (post-traumatic stress disorder)   . Pulmonary hypertension ([place])   . Rheumatoid arteritis ([place])   . Rheumatoid arthritis ([place])   . Schizophrenia ([place])   . 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 Seizures ([place])   . Steroid dependence ([place])   . Tachycardia,  unspecified   . Type 2 diabetes mellitus ([place])   . Unspecified intellectual disabilities   . Unspecified psychosis not due to a substance or known physiological condition ([place])   . Urinary tract infection   . Vitamin D deficiency     Past Surgical History:  Procedure Laterality Date  . A/V FISTULAGRAM N/A 01/28/2020   Procedure: A/V FISTULAGRAM - Right [place];  Surgeon: Waynetta Sandy, MD;  Location: [place] [facility];  Service: Cardiovascular;  Laterality: N/A;  . APPENDECTOMY    . AV FISTULA PLACEMENT Right 09/27/2019   Procedure: ARTERIOVENOUS (AV) FISTULA CREATION;  Surgeon: Waynetta Sandy, MD;  Location: [place];  Service: Vascular;  Laterality: Right;  . AV FISTULA PLACEMENT Right 02/20/2020   Procedure: INSERTION OF ARTERIOVENOUS (AV) GORE-TEX GRAFT ARM;  Surgeon: Waynetta Sandy, MD;  Location: [place];  Service: Vascular;  Laterality: Right;  . CHOLECYSTECTOMY    . COLONOSCOPY     benign polyps (12/2000), repeat colonoscopy performed in 2007  . ENDOMETRIAL BIOPSY     10/03 benign columnar mucosa (limited material)  . INSERTION OF DIALYSIS CATHETER Right 09/14/2019   Procedure: ATTEMPTED INSERTION OF DIALYSIS CATHETER;  Surgeon: Virl Cagey, MD;  Location: [facility];  Service: General;  Laterality: Right;  . INSERTION OF DIALYSIS CATHETER Right 09/27/2019   Procedure: INSERTION OF DIALYSIS CATHETER;  Surgeon: Waynetta Sandy, MD;  Location: [facility];  Service: Vascular;  Laterality: Right;  . KIDNEY SURGERY    . REVISON OF ARTERIOVENOUS FISTULA Right 02/20/2020   Procedure: REVISON OF ARTERIOVENOUS FISTULA;  Surgeon: Waynetta Sandy, MD;  Location: [facility] [facility];  Service: Vascular;  Laterality: Right;     reports that she quit smoking about 47 years ago. Her smoking use included cigarettes. She has never used smokeless tobacco. She reports that she does not drink alcohol and does not use drugs.  Allergies  Allergen Reactions  . Benztropine  Mesylate Other (See Comments)    Alopecia and white spots on eyes  . Codeine Swelling, Palpitations and Other (See Comments)    Mouth Swelling and Tachycardia   . Diazepam Other (See Comments)    COMA  . Diphenhydramine Hcl Anxiety, Palpitations and Other (See Comments)    Tachycardia and anxiousness  . Penicillins Swelling  . Sulfonamide Derivatives Rash    Blisters, also  . Thioridazine Hcl Swelling  . Bee Venom     Documented on MAR  . Lisinopril Cough    Family History  Problem Relation Age of Onset  . Bone cancer Mother   . Hypertension Mother   . Heart disease Mother   . COPD Father   . Heart disease Father   . Stroke Father     Prior to Admission medications   Medication Sig Start Date End Date Taking? Authorizing Provider  acetaminophen (TYLENOL) 500 MG tablet Take  500 mg by mouth 2 (two) times daily. At 0800 and 2000   Yes [provider]  albuterol (VENTOLIN HFA) 108 (90 Base) MCG/ACT inhaler Inhale 1 puff into the lungs every 6 (six) hours as needed for wheezing or shortness of breath.   Yes [provider]  Amino Acids (AMINO ACID PROTEIN PO) Take 30 mLs by mouth 2 (two) times daily. At 0800 and 2000   Yes [provider]  apixaban (ELIQUIS) 5 MG TABS tablet Take 1 tablet (5 mg total) by mouth 2 (two) times daily. Patient taking differently: Take 5 mg by mouth 2 (two) times daily. At 0800 and 2000 10/01/19  Yes Shelly Coss, MD  ARIPiprazole (ABILIFY) 2 MG tablet Take 2 mg by mouth daily. At 0900 12/10/19  Yes [provider]  aspirin EC 81 MG tablet Take 1 tablet (81 mg total) by mouth daily with breakfast. Patient taking differently: Take 81 mg by mouth daily with breakfast. At 0800 01/25/20  Yes Emokpae, Courage, MD  atorvastatin (LIPITOR) 20 MG tablet Take 20 mg by mouth at bedtime. At 2000 05/14/19  Yes [provider]  buPROPion (WELLBUTRIN XL) 150 MG 24 hr tablet Take 150 mg by mouth daily. At 0800   Yes [provider]  calcitRIOL (ROCALTROL) 0.25 MCG capsule Take 1 capsule (0.25 mcg total) by mouth every Monday, Wednesday, and Friday. Patient taking differently: Take 0.25 mcg by mouth every Monday, Wednesday, and Friday. At 0800 10/01/19  Yes Shelly Coss, MD  Calcium Acetate 667 MG TABS Take 2,001 mg by mouth 3 (three) times daily with meals. At 0800, 1200, and 1600   Yes [provider]  calcium carbonate (TUMS - DOSED IN MG ELEMENTAL CALCIUM) 500 MG chewable tablet Chew 1 tablet by mouth every 6 (six) hours as needed for indigestion or heartburn.   Yes [provider]  cetaphil (CETAPHIL) lotion Apply 1 application topically every 12 (twelve) hours as needed for dry skin. Apply to feet/legs   Yes [provider]  Cholecalciferol (VITAMIN D3) 50 MCG (2000 UT) TABS Take 4,000 Units by mouth daily. At 0800   Yes [provider]  Dextromethorphan-guaiFENesin 5-100 MG/5ML LIQD Take 5 mLs by mouth daily as needed (cough).   Yes [provider]  diltiazem (CARDIZEM CD) 360 MG 24 hr capsule Take 360 mg by mouth daily. At 0800   Yes [provider]  Dulaglutide (TRULICITY) 1.5 ID/7.8EU SOPN Inject 1.5 mg into the skin every Friday. At 0800   Yes [provider]  DULoxetine (CYMBALTA) 60 MG capsule Take 60 mg by mouth daily. At 0800 05/11/19  Yes [provider]  EPINEPHrine (EPI-PEN) 0.3 mg/0.3 mL DEVI Inject 0.3 mg into the muscle once as needed (for bee stings, then go to the ED immediately after using).    Yes [provider]  gabapentin (NEURONTIN) 100 MG capsule Take 1 capsule (100 mg total) by mouth 3 (three) times daily. Patient taking differently: Take 100 mg by mouth 3 (three) times daily. At 0800, 1200, and 1700 10/01/19  Yes Adhikari, Tamsen Meek, MD  insulin aspart (NOVOLOG) 100 UNIT/ML injection Inject 10 Units into the skin 3 (three) times daily before meals. At 0800, 1200, and 1700 Additional units according to  sliding scale 151-200 = 2 units 201-250 = 4 units 251-300 = 6 units 301-350 = 8 units 351-400 = 10units 401-450 = 12units >400 = call MD   Yes [provider]  levETIRAcetam (KEPPRA) 250 MG tablet  Take 250 mg by mouth 2 (two) times daily. At 0800 and 2000   Yes [provider]  levothyroxine (SYNTHROID) 50 MCG tablet Take 50 mcg by mouth daily before breakfast. At 0800   Yes [provider]  metoprolol succinate (TOPROL-XL) 200 MG 24 hr tablet Take 1 tablet (200 mg total) by mouth daily. Take with or immediately following a meal. Patient taking differently: Take 200 mg by mouth daily. Take with or immediately following a meal. At 0800 11/27/19  Yes Tat, Shanon Brow, MD  montelukast (SINGULAIR) 10 MG tablet Take 10 mg by mouth at bedtime. At 2000   Yes [provider]  Multiple Vitamin (DAILY VITE PO) Take 1 tablet by mouth daily. At 0800   Yes [provider]  nitroGLYCERIN (NITROSTAT) 0.4 MG SL tablet Place 0.4 mg under the tongue every 5 (five) minutes x 3 doses as needed for chest pain.  01/13/20  Yes [provider]  omeprazole (PRILOSEC) 20 MG capsule Take 20 mg by mouth in the morning. At 0600   Yes [provider]  ondansetron (ZOFRAN) 4 MG tablet Take 4 mg by mouth every 6 (six) hours as needed for nausea or vomiting.   Yes [provider]  predniSONE (DELTASONE) 10 MG tablet Take 10 mg by mouth daily. At 0800   Yes [provider]  primidone (MYSOLINE) 50 MG tablet Take 50 mg by mouth daily. At 0800   Yes [provider]  pyridoxine (B-6) 200 MG tablet Take 200 mg by mouth in the morning and at bedtime. At 0800 and 2000   Yes [provider]  senna (SENOKOT) 8.6 MG tablet Take 1 tablet by mouth 2 (two) times daily. At 0800 and 2000   Yes [provider]  traZODone (DESYREL) 50 MG tablet Take 50 mg by mouth at bedtime. At 2000 05/11/19  Yes [provider]  traMADol (ULTRAM) 50 MG  tablet Take 1 tablet (50 mg total) by mouth every 8 (eight) hours as needed. Patient not taking: Reported on 03/10/2020 02/20/20   Gabriel Earing, PA-C    Physical Exam: Vitals:   03/10/20 1045 03/10/20 1100 03/10/20 1133 03/10/20 1200  BP: (!) 140/95 (!) 132/98 (!) 106/56 114/77  Pulse: 50 (!) 36 72   Resp: (!) 32 (!) 33 (!) 31 (!) 28  Temp:      TempSrc:      SpO2: 97% 97% 98%     Constitutional: In respiratory distress, on 3 L oxygen via nasal cannula, obese, sleepy but arousable and answers in yes and no.  Appears dehydrated. Eyes: PERRL, lids and conjunctivae normal ENMT: Mucous membranes are dry. Posterior pharynx clear of any exudate or lesions.poor dentition.  Neck: normal, supple, no masses, no thyromegaly Respiratory: Patient tachypneic, no wheezing, rhonchi or crackles.  Decreased breath sounds noted on the bases.  Accessory muscle use.  Cardiovascular: Tachycardic, irregularly irregular rhythm, rubs / gallops.  2+ bilateral pitting edema positive. 2+ pedal pulses. No carotid bruits.  Abdomen: no tenderness, no masses palpated. No hepatosplenomegaly. Bowel sounds positive.  Musculoskeletal: no clubbing / cyanosis. No joint deformity upper and lower extremities. Good ROM, no contractures. Normal muscle tone.  Skin: no rashes, lesions, ulcers. No induration Neurologic: Unable to perform neurological exam as patient was sleepy.  Labs on Admission: I have personally reviewed following labs and imaging studies  CBC: Recent Labs  Lab 03/07/20 0044 03/10/20 1032 03/10/20 1048 03/10/20 1050  WBC 9.3 16.9*  --   --  NEUTROABS 7.0  --   --   --   HGB 9.8* 11.2* 12.2 11.9*  HCT 29.7* 35.9* 36.0 35.0*  MCV 110.8* 114.7*  --   --   PLT 169 138*  --   --    Basic Metabolic Panel: Recent Labs  Lab 03/07/20 0044 03/10/20 1032 03/10/20 1048 03/10/20 1050  NA 137 138 139 139  K 3.0* 6.0* 5.8* 5.9*  CL 101 102 102  --   CO2 28 21*  --   --   GLUCOSE 255* 418* 420*  --     BUN 24* 53* 80*  --   CREATININE 2.20* 4.40* 4.20*  --   CALCIUM 8.5* 9.4  --   --    GFR: Estimated Creatinine Clearance: 13.4 mL/min (A) (by C-G formula based on SCr of 4.2 mg/dL (H)). Liver Function Tests: Recent Labs  Lab 03/10/20 1112  AST 432*  ALT 330*  ALKPHOS 99  BILITOT 1.5*  PROT 5.0*  ALBUMIN 3.1*   No results for input(s): LIPASE, AMYLASE in the last 168 hours. No results for input(s): AMMONIA in the last 168 hours. Coagulation Profile: Recent Labs  Lab 03/10/20 1112  INR 2.1*   Cardiac Enzymes: No results for input(s): CKTOTAL, CKMB, CKMBINDEX, TROPONINI in the last 168 hours. BNP (last 3 results) No results for input(s): PROBNP in the last 8760 hours. HbA1C: No results for input(s): HGBA1C in the last 72 hours. CBG: No results for input(s): GLUCAP in the last 168 hours. Lipid Profile: No results for input(s): CHOL, HDL, LDLCALC, TRIG, CHOLHDL, LDLDIRECT in the last 72 hours. Thyroid Function Tests: No results for input(s): TSH, T4TOTAL, FREET4, T3FREE, THYROIDAB in the last 72 hours. Anemia Panel: No results for input(s): VITAMINB12, FOLATE, FERRITIN, TIBC, IRON, RETICCTPCT in the last 72 hours. Urine analysis:    Component Value Date/Time   COLORURINE YELLOW 01/23/2020 1452   APPEARANCEUR TURBID (A) 01/23/2020 1452   LABSPEC 1.016 01/23/2020 1452   PHURINE 5.0 01/23/2020 1452   GLUCOSEU >=500 (A) 01/23/2020 1452   HGBUR LARGE (A) 01/23/2020 1452   HGBUR negative 10/20/2009 1425   BILIRUBINUR NEGATIVE 01/23/2020 1452   KETONESUR NEGATIVE 01/23/2020 1452   PROTEINUR >=300 (A) 01/23/2020 1452   UROBILINOGEN 0.2 10/05/2011 1002   NITRITE NEGATIVE 01/23/2020 1452   LEUKOCYTESUR MODERATE (A) 01/23/2020 1452    Radiological Exams on Admission: DG Chest Port 1 View  Result Date: 03/10/2020 CLINICAL DATA:  Short of breath, chest pain EXAM: PORTABLE CHEST 1 VIEW COMPARISON:  03/07/2020 FINDINGS: Cardiac enlargement with vascular congestion and mild  interstitial edema. Mild right lower lobe patchy airspace disease and small right effusion. Left jugular central venous catheter tip in the SVC unchanged. No pneumothorax. IMPRESSION: Cardiac enlargement with mild progression of vascular congestion and probable interstitial edema. Small right effusion. Mild progression of right lower lobe airspace disease. Electronically Signed   By: Franchot Gallo M.D.   On: 03/10/2020 10:47    EKG: Independently reviewed.  Atrial flutter/A. fib with ventricular premature complexes.  Right bundle branch block and LVH.  Assessment/Plan Principal Problem:   Atrial fibrillation with RVR (HCC) Active Problems:   Type 2 diabetes mellitus with diabetic polyneuropathy, without long-term current use of insulin (HCC)   Diabetic peripheral neuropathy (HCC)   HYPERCHOLESTEROLEMIA   Gout, unspecified   Schizophrenia ([place])   Essential hypertension   Acute on chronic diastolic CHF (congestive heart failure) ([place])   Hypothyroidism   ESRD (end stage renal disease) ([place])  Elevated troponin   Acute on chronic respiratory failure with hypoxia (HCC)   Macrocytic anemia   Thrombocytopenia (HCC)   Hyperkalemia   Sepsis (HCC)   Elevated liver enzymes    A. fib with RVR:  -Upon EMS arrival: Patient's heart rate was noted to be in 170s.  Received bolus of Cardizem in route to ED.   -Admit patient to stepdown unit for close monitoring.  On telemetry.  Start on IV Cardizem.  Monitor heart rate closely.  Check TSH.  Monitor electrolytes closely. -Monitor vitals closely. -EDP consulted cardiology-await recommendations  Sepsis secondary to right lower lobe pneumonia: -Patient tachycardic, tachypneic, lactic acid: 4.9, leukocytosis of 16.9.  Currently on 3 L of oxygen.  Reviewed chest x-ray. -Patient met sepsis criteria.  She received IV Vanco cefepime and Flagyl in ED. -Blood culture is pending.  Check procalcitonin level. -Check urine Legionella antigen and urine strep  pneumo antigen. -Continue IV Vanco and cefepime for now. -DuoNebs as needed.  Acute on chronic hypoxemic respiratory failure secondary to acute on chronic diastolic CHF:  -BNP elevated at 1486, reviewed chest x-ray. -As per patient: She does not make urine -Consulted nephrology for dialysis as patient is in fluid overload. -Strict INO's and daily weight.  Monitor electrolytes. -Reviewed echo from 11/23/2019 which showed ejection fraction of 65 to 70% mild LVH, moderate TR, mild to moderate aortic stenosis. -Continue home meds of aspirin, statin, metoprolol  Acute metabolic encephalopathy: -Patient is drowsy but arousable.  Likely multifactorial- secondary to electrolyte rearrangement/elevated LFTs/sepsis -Check ammonia level. -Consult PT/OT/SLP.  We will keep her n.p.o. until she passes a bedside swallow evaluation. -On fall/seizure precautions -Neurochecks.  ESRD on hemodialysis: -Potassium: 6.0.  Reviewed EKG. -EDP consulted nephrology for dialysis  Hyperkalemia: Likely secondary to ESRD -Received IV calcium gluconate in ED. -Repeat BMP tomorrow a.m.  Elevated liver enzymes: AST: 432, ALT: 330  -Could be secondary to shock liver secondary to acute on chronic CHF. -We will get right upper quadrant ultrasound.  Avoid hepatotoxic medication. -Repeat CMP tomorrow a.m. -Check ammonia level.  Elevated troponin: Initial troponin 42. -Reviewed EKG.  Trend troponin.  Nitro as needed for chest pain.  Likely secondary to demand ischemia.  Paroxysmal A. fib: Continue IV Lotensin, metoprolol and Eliquis.  Macrocytic anemia: -Check B12, folate and TSH -Monitor H&H closely  Thrombocytopenia: Platelet: 138 -No signs of active bleeding.  Repeat CBC tomorrow a.m.  Diabetes mellitus: Type II.  Check A1c. -Blood glucose elevated upon arrival.  Hold Trulicity and home NovoLog.  Started patient on sliding scale insulin and monitor blood sugar closely.  Hypothyroidism: Check TSH.  Continue  levothyroxine  Seizure disorder: Continue Keppra  Depression/anxiety/bipolar: Continue Abilify, Wellbutrin, Cymbalta, trazodone  COPD: Continue Singulair, home inhalers  Rheumatoid arthritis: Continue prednisone 10 mg daily  GERD: Continue PPI  Patient is at high risk of morbidity and mortality.  Please note: Patient's blood pressure noted to be on lower side.  Her Cardizem was discontinued and was started on amiodarone drip as per cardiology recommendations.  Patient continues to have shortness of breath/using accessory muscles/cold to touch-I called PCCM to come and evaluate the patient for possible ICU transfer.  DVT prophylaxis: Eliquis/SCD Code Status: Full code-confirmed with the patient Family Communication: None present at bedside.  Plan of care discussed with patient in length and he verbalized understanding and agreed with it. Disposition Plan: SNF after diuresis Consults called: Nephrology and cardiology by EDP Admission status: Inpatient   Ollen Bowl MD [facility]  If  7PM-7AM, please contact night-coverage [email or web address] Password [facility]  03/10/2020, 12:36 PM

## 2020-03-10 NOTE — Progress Notes (Signed)
Pharmacy Antibiotic Note  [name] is a 69 y.o. female admitted on 03/10/2020 with sepsis. ESRD on HD TTS - last HD session date unclear.  To begin crrt tonight  Weight: 85.5 kg (188 lb 7.9 oz)  Temp (24hrs), Avg:98.3 F (36.8 C), Min:97.1 F (36.2 C), Max:99.5 F (37.5 C)  Recent Labs  Lab 03/07/20 0044 03/10/20 1032 03/10/20 1048 03/10/20 1112 03/10/20 1323  WBC 9.3 16.9*  --   --   --   CREATININE 2.20* 4.40* 4.20*  --   --   LATICACIDVEN  --   --   --  4.9* 2.7*    Estimated Creatinine Clearance: 13 mL/min (A) (by C-G formula based on SCr of 4.2 mg/dL (H)).    Allergies  Allergen Reactions  . Benztropine Mesylate Other (See Comments)    Alopecia and white spots on eyes  . Codeine Swelling, Palpitations and Other (See Comments)    Mouth Swelling and Tachycardia   . Diazepam Other (See Comments)    COMA  . Diphenhydramine Hcl Anxiety, Palpitations and Other (See Comments)    Tachycardia and anxiousness  . Penicillins Swelling  . Sulfonamide Derivatives Rash    Blisters, also  . Thioridazine Hcl Swelling  . Bee Venom     Documented on MAR  . Lisinopril Cough    Plan: Cefepime 2g IV q12h to start at 0000 vanc 750 mg q24h to start tomorrow at 1200 Monitor crrt and adjust as needed when off  Barth Kirks, PharmD, BCPS, BCCCP Clinical Pharmacist [phone]  Please check AMION for all [place] numbers  03/10/2020 6:37 PM

## 2020-03-10 NOTE — Consult Note (Addendum)
Cardiology Consultation:   Patient ID: [name]; [id number]; 03-Aug-1951   Admit date: 03/10/2020 Date of Consult: 03/10/2020  Primary Care Provider: Glenda Chroman, MD Primary Cardiologist: Dr. Rozann Lesches, MD  Patient Profile:   [name] is a 69 y.o. female with a hx of chronic diastolic HF, persistent atrial fibrillation/flutter on anticoagulation with Eliquis, anemia, mild to moderate aortic stenosis, ESRD on HD, HTN, HLD, bifascicular block (known RBBB + LAFB), COPD, DM 2, history of PE, seizure disorder, schizophrenia, Barrett's esophagus, memory loss and OSA who is being seen today for the evaluation of atrial fibrillation with RVR at the request of [name].   History of Present Illness:   [name] is a 69 year old female with a history as stated above who presented to [facility]10[facility] from SNF with shortness of breath and elevated blood glucose. HPI obtained from chart review given patient's acute illness. Per chart review, EMS was called by the SNF staff today, 03/10/2020 as the patient appeared short of breath. EKG performed which showed atrial fibrillation with RVR with heart rate in the 170 range. She received IV diltiazem in route and was placed on a nonrebreather. Blood glucose was found to be 520 therefore she was treated with insulin therapy.  In the ED, patient was tachypneic and tachycardic. She was placed on 3 L nasal cannula.  WBC elevated at 16.9.  High-sensitivity troponin found to be 48.  BNP elevated at 1486. Lactic acid found to be 4.9.  Covid testing was negative.  Patient had elevated AST/ALT levels at 432/330.  CXR with fluid volume overload. She was placed on empiric antibiotics per primary team.  Unfortunately, on my assessment patient's heart rate remains in the 150 range and she is hypotensive and moderately responsive. She is mentating and able to answer yes and no questions however is very ill-appearing. Extremities are cool to touch.  Patient appears shocky with poor respiratory status.   She was remotely followed by cardiology for diastolic CHF and was lost to follow-up for unclear reasons. She was then admitted in January/February 2021 with respiratory failure/PNA and progressed to acute renal failure requiring hemodialysis. During admission she was reported to have new onset atrial fibrillation  (later deemed to be atrial flutter per cardiology note ) and was placed on Eliquis therapy. She was then readmitted with palpitations and found to have SVT versus atrial flutter then indeterminate conversion to normal sinus rhythm.  Her carvedilol was changed to metoprolol. She had an elevated troponin level in the setting of a severe tachycardia, ESRD and fluid volume overload which was felt not to represent ACS. She required the addition of diltiazem. Echocardiogram from 11/2019 with LVEF at 65 to 70% with mild LVH, moderate pulmonary hypertension, mild to moderate aortic stenosis and mild TR. She was recently discharged from hospital 01/25/2020 with pyelonephritis with acute on chronic respiratory failure with hypoxia and sepsis.  Unfortunately, she represented to the ED on 01/31/2020 with atrial fibrillation with rates in the 110 to 120 bpm range as well as a Candida infection. She was given Diflucan and no other changes were made.  She was most recently seen in cardiology follow-up 02/25/2020 accompanied by staff from [facility] facility.  Heart rate at that time was noted to be in the 90-100 range on high-dose diltiazem as well as Toprol.  No changes were made and plans were for follow-up in 6 months.  Past Medical History:  Diagnosis Date  . Anemia   .  Angina pectoris ([place])   . Anxiety   . Asthma   . Atrial flutter ([place])   . Back pain   . Barrett esophagus   . Bifascicular block   . BMI 39.0-39.9,adult   . Cellulitis   . CHF (congestive heart failure) ([place])   . Chronic diastolic CHF (congestive heart failure) ([place])   .  Constipation   . Contact with and (suspected) exposure to covid-19   . COPD (chronic obstructive pulmonary disease) ([place])   . Dependence on renal dialysis ([place])   . Depression   . Dysphagia   . Dysuria   . Epilepsy ([place])   . ESRD on hemodialysis ([place])   . Essential hypertension   . Fibromyalgia   . Fibromyalgia   . GERD (gastroesophageal reflux disease)   . Gout   . History of pericarditis   . Hypercholesterolemia   . Hyperlipidemia   . Hypothyroid   . Hypothyroidism   . Insomnia   . Long term (current) use of insulin ([place])   . Major depressive disorder   . Memory loss   . Moderate protein-calorie malnutrition ([place])   . Morbid obesity ([place])   . Nephrolithiasis    2008  . Obesity hypoventilation syndrome (HCC)    CPAP  . Obstructive sleep apnea   . Pain in left arm   . PE (pulmonary embolism)    2010  . Peripheral neuropathy   . Persistent atrial fibrillation ([place])   . Pneumonia   . Polyp of nasal sinus   . PTSD (post-traumatic stress disorder)   . Pulmonary hypertension ([place])   . Rheumatoid arteritis ([place])   . Rheumatoid arthritis ([place])   . Schizophrenia ([place])   . 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 Seizures ([place])   . Steroid dependence ([place])   . Tachycardia, unspecified   . Type 2 diabetes mellitus ([place])   . Unspecified intellectual disabilities   . Unspecified psychosis not due to a substance or known physiological condition ([place])   . Urinary tract infection   . Vitamin D deficiency     Past Surgical History:  Procedure Laterality Date  . A/V FISTULAGRAM N/A 01/28/2020   Procedure: A/V FISTULAGRAM - Right [place];  Surgeon: Waynetta Sandy, MD;  Location: [facility];  Service: Cardiovascular;  Laterality: N/A;  . APPENDECTOMY    . AV FISTULA PLACEMENT Right 09/27/2019   Procedure: ARTERIOVENOUS (AV) FISTULA CREATION;  Surgeon: Waynetta Sandy, MD;  Location: [facility];  Service: Vascular;  Laterality: Right;  . AV FISTULA  PLACEMENT Right 02/20/2020   Procedure: INSERTION OF ARTERIOVENOUS (AV) GORE-TEX GRAFT ARM;  Surgeon: Waynetta Sandy, MD;  Location: [facility];  Service: Vascular;  Laterality: Right;  . CHOLECYSTECTOMY    . COLONOSCOPY     benign polyps (12/2000), repeat colonoscopy performed in 2007  . ENDOMETRIAL BIOPSY     10/03 benign columnar mucosa (limited material)  . INSERTION OF DIALYSIS CATHETER Right 09/14/2019   Procedure: ATTEMPTED INSERTION OF DIALYSIS CATHETER;  Surgeon: Virl Cagey, MD;  Location: [facility];  Service: General;  Laterality: Right;  . INSERTION OF DIALYSIS CATHETER Right 09/27/2019   Procedure: INSERTION OF DIALYSIS CATHETER;  Surgeon: Waynetta Sandy, MD;  Location: [facility];  Service: Vascular;  Laterality: Right;  . KIDNEY SURGERY    . REVISON OF ARTERIOVENOUS FISTULA Right 02/20/2020   Procedure: REVISON OF ARTERIOVENOUS FISTULA;  Surgeon: Waynetta Sandy, MD;  Location: [facility];  Service: Vascular;  Laterality:  Right;     Prior to Admission medications   Medication Sig Start Date End Date Taking? Authorizing Provider  acetaminophen (TYLENOL) 500 MG tablet Take 500 mg by mouth 2 (two) times daily. At 0800 and 2000   Yes [provider]  albuterol (VENTOLIN HFA) 108 (90 Base) MCG/ACT inhaler Inhale 1 puff into the lungs every 6 (six) hours as needed for wheezing or shortness of breath.   Yes [provider]  Amino Acids (AMINO ACID PROTEIN PO) Take 30 mLs by mouth 2 (two) times daily. At 0800 and 2000   Yes [provider]  apixaban (ELIQUIS) 5 MG TABS tablet Take 1 tablet (5 mg total) by mouth 2 (two) times daily. Patient taking differently: Take 5 mg by mouth 2 (two) times daily. At 0800 and 2000 10/01/19  Yes Shelly Coss, MD  ARIPiprazole (ABILIFY) 2 MG tablet Take 2 mg by mouth daily. At 0900 12/10/19  Yes [provider]  aspirin EC 81 MG tablet Take 1 tablet (81 mg total) by mouth daily with breakfast. Patient  taking differently: Take 81 mg by mouth daily with breakfast. At 0800 01/25/20  Yes Emokpae, Courage, MD  atorvastatin (LIPITOR) 20 MG tablet Take 20 mg by mouth at bedtime. At 2000 05/14/19  Yes [provider]  buPROPion (WELLBUTRIN XL) 150 MG 24 hr tablet Take 150 mg by mouth daily. At 0800   Yes [provider]  calcitRIOL (ROCALTROL) 0.25 MCG capsule Take 1 capsule (0.25 mcg total) by mouth every Monday, Wednesday, and Friday. Patient taking differently: Take 0.25 mcg by mouth every Monday, Wednesday, and Friday. At 0800 10/01/19  Yes Shelly Coss, MD  Calcium Acetate 667 MG TABS Take 2,001 mg by mouth 3 (three) times daily with meals. At 0800, 1200, and 1600   Yes [provider]  calcium carbonate (TUMS - DOSED IN MG ELEMENTAL CALCIUM) 500 MG chewable tablet Chew 1 tablet by mouth every 6 (six) hours as needed for indigestion or heartburn.   Yes [provider]  cetaphil (CETAPHIL) lotion Apply 1 application topically every 12 (twelve) hours as needed for dry skin. Apply to feet/legs   Yes [provider]  Cholecalciferol (VITAMIN D3) 50 MCG (2000 UT) TABS Take 4,000 Units by mouth daily. At 0800   Yes [provider]  Dextromethorphan-guaiFENesin 5-100 MG/5ML LIQD Take 5 mLs by mouth daily as needed (cough).   Yes [provider]  diltiazem (CARDIZEM CD) 360 MG 24 hr capsule Take 360 mg by mouth daily. At 0800   Yes [provider]  Dulaglutide (TRULICITY) 1.5 ZO/1.0RU SOPN Inject 1.5 mg into the skin every Friday. At 0800   Yes [provider]  DULoxetine (CYMBALTA) 60 MG capsule Take 60 mg by mouth daily. At 0800 05/11/19  Yes [provider]  EPINEPHrine (EPI-PEN) 0.3 mg/0.3 mL DEVI Inject 0.3 mg into the muscle once as needed (for bee stings, then go to the ED immediately after using).    Yes [provider]  gabapentin (NEURONTIN) 100 MG capsule Take 1 capsule (100 mg total) by mouth 3 (three)  times daily. Patient taking differently: Take 100 mg by mouth 3 (three) times daily. At 0800, 1200, and 1700 10/01/19  Yes Adhikari, Tamsen Meek, MD  insulin aspart (NOVOLOG) 100 UNIT/ML injection Inject 10 Units into the skin 3 (three) times daily before meals. At 0800, 1200, and 1700 Additional units according to sliding scale 151-200 = 2 units 201-250 = 4 units 251-300 = 6  units 301-350 = 8 units 351-400 = 10units 401-450 = 12units >400 = call MD   Yes [provider]  levETIRAcetam (KEPPRA) 250 MG tablet Take 250 mg by mouth 2 (two) times daily. At 0800 and 2000   Yes [provider]  levothyroxine (SYNTHROID) 50 MCG tablet Take 50 mcg by mouth daily before breakfast. At 0800   Yes [provider]  metoprolol succinate (TOPROL-XL) 200 MG 24 hr tablet Take 1 tablet (200 mg total) by mouth daily. Take with or immediately following a meal. Patient taking differently: Take 200 mg by mouth daily. Take with or immediately following a meal. At 0800 11/27/19  Yes Tat, Shanon Brow, MD  montelukast (SINGULAIR) 10 MG tablet Take 10 mg by mouth at bedtime. At 2000   Yes [provider]  Multiple Vitamin (DAILY VITE PO) Take 1 tablet by mouth daily. At 0800   Yes [provider]  nitroGLYCERIN (NITROSTAT) 0.4 MG SL tablet Place 0.4 mg under the tongue every 5 (five) minutes x 3 doses as needed for chest pain.  01/13/20  Yes [provider]  omeprazole (PRILOSEC) 20 MG capsule Take 20 mg by mouth in the morning. At 0600   Yes [provider]  ondansetron (ZOFRAN) 4 MG tablet Take 4 mg by mouth every 6 (six) hours as needed for nausea or vomiting.   Yes [provider]  predniSONE (DELTASONE) 10 MG tablet Take 10 mg by mouth daily. At 0800   Yes [provider]  primidone (MYSOLINE) 50 MG tablet Take 50 mg by mouth daily. At 0800   Yes [provider]  pyridoxine (B-6) 200 MG tablet Take 200 mg by mouth in the morning and at  bedtime. At 0800 and 2000   Yes [provider]  senna (SENOKOT) 8.6 MG tablet Take 1 tablet by mouth 2 (two) times daily. At 0800 and 2000   Yes [provider]  traZODone (DESYREL) 50 MG tablet Take 50 mg by mouth at bedtime. At 2000 05/11/19  Yes [provider]  traMADol (ULTRAM) 50 MG tablet Take 1 tablet (50 mg total) by mouth every 8 (eight) hours as needed. Patient not taking: Reported on 03/10/2020 02/20/20   Gabriel Earing, PA-C    Inpatient Medications: Scheduled Meds: . apixaban  5 mg Oral BID  . ARIPiprazole  2 mg Oral Daily  . [START ON 03/11/2020] aspirin EC  81 mg Oral Q breakfast  . atorvastatin  20 mg Oral QHS  . buPROPion  150 mg Oral Daily  . calcitRIOL  0.25 mcg Oral Q M,W,F  . calcium acetate  2,001 mg Oral TID WC  . DULoxetine  60 mg Oral Daily  . gabapentin  100 mg Oral TID  . insulin aspart  0-15 Units Subcutaneous TID WC  . insulin aspart  0-5 Units Subcutaneous QHS  . levETIRAcetam  250 mg Oral BID  . [START ON 03/11/2020] levothyroxine  50 mcg Oral QAC breakfast  . metoprolol  200 mg Oral Daily  . montelukast  10 mg Oral QHS  . [START ON 03/11/2020] pantoprazole  40 mg Oral Daily  . predniSONE  10 mg Oral Daily  . primidone  50 mg Oral Daily  . sodium zirconium cyclosilicate  10 g Oral BID  . traZODone  50 mg Oral QHS  . [START ON 03/11/2020] vancomycin variable dose per unstable renal function (pharmacist dosing)   Does not apply See admin instructions   Continuous Infusions: . [START  ON 03/11/2020] ceFEPime (MAXIPIME) IV    . diltiazem (CARDIZEM) infusion 5 mg/hr (03/10/20 1505)  . vancomycin 2,000 mg (03/10/20 1518)   PRN Meds: acetaminophen, albuterol, calcium carbonate, nitroGLYCERIN, ondansetron (ZOFRAN) IV  Allergies:    Allergies  Allergen Reactions  . Benztropine Mesylate Other (See Comments)    Alopecia and white spots on eyes  . Codeine Swelling, Palpitations and Other (See Comments)    Mouth Swelling and  Tachycardia   . Diazepam Other (See Comments)    COMA  . Diphenhydramine Hcl Anxiety, Palpitations and Other (See Comments)    Tachycardia and anxiousness  . Penicillins Swelling  . Sulfonamide Derivatives Rash    Blisters, also  . Thioridazine Hcl Swelling  . Bee Venom     Documented on MAR  . Lisinopril Cough    Social History:   Social History   Socioeconomic History  . Marital status: Single    Spouse name: Not on file  . Number of children: 0  . Years of education: Not on file  . Highest education level: 12th grade  Occupational History  . Not on file  Tobacco Use  . Smoking status: Former Smoker    Types: Cigarettes    Quit date: 06/23/1972    Years since quitting: 47.7  . Smokeless tobacco: Never Used  Vaping Use  . Vaping Use: Never used  Substance and Sexual Activity  . Alcohol use: No  . Drug use: No  . Sexual activity: Never  Other Topics Concern  . Not on file  Social History Narrative   Right handed   Caffeine 1 cup per day    Lives at [facility] Strain:   . Difficulty of Paying Living Expenses:   Food Insecurity:   . Worried About Charity fundraiser in the Last Year:   . Arboriculturist in the Last Year:   Transportation Needs:   . Film/video editor (Medical):   [name] Kitchen Lack of Transportation (Non-Medical):   Physical Activity:   . Days of Exercise per Week:   . Minutes of Exercise per Session:   Stress:   . Feeling of Stress :   Social Connections:   . Frequency of Communication with Friends and Family:   . Frequency of Social Gatherings with Friends and Family:   . Attends Religious Services:   . Active Member of Clubs or Organizations:   . Attends Archivist Meetings:   [name] Kitchen Marital Status:   Intimate Partner Violence:   . Fear of Current or Ex-Partner:   . Emotionally Abused:   [name] Kitchen Physically Abused:   . Sexually Abused:     Family History:   Family History  Problem  Relation Age of Onset  . Bone cancer Mother   . Hypertension Mother   . Heart disease Mother   . COPD Father   . Heart disease Father   . Stroke Father    Family Status:  Family Status  Relation Name Status  . Mother  Deceased  . Father  Deceased    ROS:  Please see the history of present illness.  All other ROS reviewed and negative.     Physical Exam/Data:   Vitals:   03/10/20 1502 03/10/20 1503 03/10/20 1510 03/10/20 1515  BP: (!) 77/44 (!) 98/50 (!) 94/55 (!) 93/46  Pulse:    (!) 146  Resp: (!) 28 (!) 31 (!) 27 (!) 26  Temp:  TempSrc:      SpO2:    99%    Intake/Output Summary (Last 24 hours) at 03/10/2020 1532 Last data filed at 03/10/2020 1356 Gross per 24 hour  Intake 243.58 ml  Output --  Net 243.58 ml   There were no vitals filed for this visit. There is no height or weight on file to calculate BMI.   General: Ill appearing, mild distress  Skin: Cool, clammy  Neck: Negative for carotid bruits. No JVD Lungs: Diminished bilaterally. Breathing is labored. Cardiovascular: Irregularly irregular with S1 S2. No murmurs Abdomen: Soft, non-tender, non-distended. No obvious abdominal masses. Extremities: No edema. Radial pulses 1+ bilaterally Neuro: Alert and oriented. Psych: Responds to questions somewhat appropriately with normal affect.     EKG:  The EKG was personally reviewed and demonstrates: 03/10/20 Atrial fibrillation with HR 133bpm with known LBBB Telemetry:  Telemetry was personally reviewed and demonstrates: 03/10/20 AF with HR in the 130 range   Relevant CV Studies:  ECHO: 11/23/19:   1. Left ventricular ejection fraction, by estimation, is 65 to 70%. The  left ventricle has normal function. The left ventricle has no regional  wall motion abnormalities. There is mild left ventricular hypertrophy.  Left ventricular diastolic parameters  are indeterminate.   2. Right ventricular systolic function is normal. The right ventricular  size is  moderately enlarged. There is moderately elevated pulmonary artery  systolic pressure.   3. Left atrial size was severely dilated.   4. Right atrial size was severely dilated.   5. The mitral valve is normal in structure. Mild mitral valve  regurgitation. No evidence of mitral stenosis.   6. Tricuspid valve regurgitation is moderate.   7. The aortic valve is tricuspid. Aortic valve regurgitation is not  visualized. Mild to moderate aortic valve stenosis.   8. The inferior vena cava is normal in size with greater than 50%  respiratory variability, suggesting right atrial pressure of 3 mmHg.   ECHO 09/15/2018:   1. The left ventricle has normal systolic function of 25-36%. The cavity  size is normal. There is concentric left ventricular wall thickness. Echo  evidence of impaired relaxation diastolic filling patterns. Normal left  ventricular filling pressures.   2. Severely dilated left atrial size.   3. Normal right atrial size.   4. Normal tricuspid valve.   5. Tricuspid regurgitation is mild.   6. The aortic valve tricuspid. There is mild thickening of the aortic  valve.   7. Mild aortic annular calcification.   8. The aortic root is normal is size and structure.   9. No atrial level shunt detected by color flow Doppler.   Laboratory Data:  Chemistry Recent Labs  Lab 03/07/20 0044 03/07/20 0044 03/10/20 1032 03/10/20 1048 03/10/20 1050  NA 137   < > 138 139 139  K 3.0*   < > 6.0* 5.8* 5.9*  CL 101  --  102 102  --   CO2 28  --  21*  --   --   GLUCOSE 255*  --  418* 420*  --   BUN 24*  --  53* 80*  --   CREATININE 2.20*  --  4.40* 4.20*  --   CALCIUM 8.5*  --  9.4  --   --   GFRNONAA 22*  --  10*  --   --   GFRAA 26*  --  11*  --   --   ANIONGAP 8  --  15  --   --    < > =  values in this interval not displayed.    Total Protein  Date Value Ref Range Status  03/10/2020 5.0 (L) 6.5 - 8.1 g/dL Final   Albumin  Date Value Ref Range Status  03/10/2020 3.1 (L) 3.5 -  5.0 g/dL Final   AST  Date Value Ref Range Status  03/10/2020 432 (H) 15 - 41 U/L Final   ALT  Date Value Ref Range Status  03/10/2020 330 (H) 0 - 44 U/L Final   Alkaline Phosphatase  Date Value Ref Range Status  03/10/2020 99 38 - 126 U/L Final   Total Bilirubin  Date Value Ref Range Status  03/10/2020 1.5 (H) 0.3 - 1.2 mg/dL Final   Hematology Recent Labs  Lab 03/07/20 0044 03/07/20 0044 03/10/20 1032 03/10/20 1048 03/10/20 1050  WBC 9.3  --  16.9*  --   --   RBC 2.68*  --  3.13*  --   --   HGB 9.8*   < > 11.2* 12.2 11.9*  HCT 29.7*   < > 35.9* 36.0 35.0*  MCV 110.8*  --  114.7*  --   --   MCH 36.6*  --  35.8*  --   --   MCHC 33.0  --  31.2  --   --   RDW 15.9*  --  16.2*  --   --   PLT 169  --  138*  --   --    < > = values in this interval not displayed.   Cardiac EnzymesNo results for input(s): TROPONINI in the last 168 hours. No results for input(s): TROPIPOC in the last 168 hours.  BNP Recent Labs  Lab 03/10/20 1032  BNP 1,486.7*    DDimer No results for input(s): DDIMER in the last 168 hours. TSH:  Lab Results  Component Value Date   TSH 2.274 11/22/2019   Lipids: Lab Results  Component Value Date   CHOL 160 09/16/2018   HDL 31 (L) 09/16/2018   LDLCALC 78 09/16/2018   LDLDIRECT 96 02/28/2008   TRIG 255 (H) 09/16/2018   CHOLHDL 5.2 09/16/2018   HgbA1c: Lab Results  Component Value Date   HGBA1C 8.0 (H) 03/10/2020    Radiology/Studies:  CT Head Wo Contrast  Result Date: 03/07/2020 CLINICAL DATA:  Delirium.  Possible seizure.  Rightward gaze. EXAM: CT HEAD WITHOUT CONTRAST TECHNIQUE: Contiguous axial images were obtained from the base of the skull through the vertex without intravenous contrast. COMPARISON:  10/21/2019 FINDINGS: Brain: There is no evidence of acute infarct, intracranial hemorrhage, mass, midline shift, or extra-axial fluid collection. Mild cerebral atrophy is unchanged. Cerebral white matter hypodensities are also unchanged  and nonspecific but compatible with mild chronic small vessel ischemic disease. Vascular: Calcified atherosclerosis at the skull base. No hyperdense vessel. Skull: No fracture or suspicious osseous lesion. Sinuses/Orbits: Mild polypoid mucosal thickening and/or small mucous retention cysts in the maxillary sinuses. Clear mastoid air cells. Bilateral cataract extraction. Other: None. IMPRESSION: 1. No evidence of acute intracranial abnormality. 2. Mild chronic small vessel ischemic disease. Electronically Signed   By: Logan Bores M.D.   On: 03/07/2020 04:26   DG Chest Port 1 View  Result Date: 03/10/2020 CLINICAL DATA:  Short of breath, chest pain EXAM: PORTABLE CHEST 1 VIEW COMPARISON:  03/07/2020 FINDINGS: Cardiac enlargement with vascular congestion and mild interstitial edema. Mild right lower lobe patchy airspace disease and small right effusion. Left jugular central venous catheter tip in the SVC unchanged. No pneumothorax. IMPRESSION: Cardiac enlargement with mild progression  of vascular congestion and probable interstitial edema. Small right effusion. Mild progression of right lower lobe airspace disease. Electronically Signed   By: Franchot Gallo M.D.   On: 03/10/2020 10:47   DG Chest Port 1 View  Result Date: 03/07/2020 CLINICAL DATA:  Seizure, short of breath, asthma EXAM: PORTABLE CHEST 1 VIEW COMPARISON:  01/23/2020 FINDINGS: Single frontal view of the chest demonstrates stable left internal jugular dialysis catheter. Cardiac silhouette is enlarged but stable. There is chronic central vascular congestion, without acute airspace disease. Trace right pleural effusion. No pneumothorax. IMPRESSION: 1. Vascular congestion without overt edema. 2. Trace right pleural effusion. Electronically Signed   By: Randa Ngo M.D.   On: 03/07/2020 00:46   Assessment and Plan:   1.  Persistent atrial fibrillation with RVR: -Initially diagnosed with atrial fibrillation during hospitalization earlier this  year in the setting of sepsis and pneumonia. Rates have been difficult to control per notes. She is  She was essentially discharged and has been living at a skilled nursing facility who noticed increasing work of breathing with an elevated blood glucose therefore EMS was called for transport to the ED. On their assessment, EKG showed atrial fibrillation with RVR with a rate in the 170 range. She was given IV diltiazem and placed on a diltiazem infusion however this has been complicated by hypotension. -Will require transition from IV diltiazem to IV amiodarone given hypotension and acute illness. Would recommend transferring to ICU as she may require more supportive care based on assessment today.   2.  Sepsis secondary to right lower lobe pneumonia: -Patient presented with tachypnea and tachycardia found to have a lactic acid of 4.9 with leukocytosis at 16.9, hypotension and hypoxia  -Placed on empiric antibiotics per primary team with blood culture and procalcitonin levels pending -Management per primary team -Cardiology would recommend up grading to ICU bed given acute illness   3.  Acute on chronic hypoxic respiratory failure with chronic diastolic CHF: -BNP found to be markedly elevated on presentation at 1486 with subsequent CXR showing fluid volume overload -Primary team consulted with nephrology for dialysis -Last echocardiogram from 11/2019 with normal LV function, mild LVH, moderate pulmonary hypertension, mild to moderate aortic stenosis and moderate TR -Weight, 199lb on 02/25/2020 which appears to be up from her baseline of 190lb  -Daily weight, strict I&O  4.  Aortic stenosis: -As above, last echocardiogram from 11/2019 with mild to moderate AS -Denies syncope -Follow with serial echocardiograms  5.  Hyportension: -Unstable, low BPs today also noted to be low on last outpatient follow-up on 02/25/2020 with a BP at 62 with a pulse rate of 100 bpm -She is on PTA high-dose diltiazem for  persistent atrial fibrillation along with Toprol  -Currently BP 105/61>109/66>93/46 -Discontinue Diltiazem and transition to Amiodarone infusion  6.  HLD: -Last LDL, 78 on 09/2018 -Continue atorvastatin when able to take PO   7.  ESRD on hemodialysis: -Patient began dialysis 09/25/2019 during hospital admission for acute respiratory failure/vent dependent respiratory failure -Management per primary team, nephrology  Other hospital issues per primary team: -Sepsis -Macrocytic anemia -Seizure disorder -COPD -Depression/anxiety/bipolar disorder -GERD -Thrombocytopenia -Markedly elevated liver enzymes -Hypothyroidism -DM2   For questions or updates, please contact [facility] Please consult [email or web address] for contact info under Cardiology/STEMI.   SignedKathyrn Drown NP-C [facility] Pager: [phone] 03/10/2020 3:32 PM  Agree with note by Kathyrn Drown NP  We are asked to see this critically ill 69 year old moderately overweight female with multiple medical problems for  A. fib with RVR.  She has had this in the past.  Her other problems include hypoxic respiratory failure most likely multifactorial from pneumonia, sepsis and diastolic heart failure with moderate aortic stenosis.  She is on Eliquis oral anticoagulation.  Her BNP is elevated.  She is in A. fib with RVR with heart rate between 130-150.  She was on high-dose diltiazem and beta-blocker currently on IV diltiazem.  She is relatively hypotensive.  She is elevated lactate consistent with septic shock.  She is minimally responsive on a exam but is able to answer questions with some difficulty.  She is tachycardic.  Her lungs are clear.  She is cool peripherally with some mild edema.  Rate control is somewhat problematic given her hypotension.  Agree with IV diltiazem and addition of amiodarone.  I would hold her Eliquis and begin her on IV heparin.  She does have chronic renal insufficiency on hemodialysis.  She is a full code  but this may need to be readdressed with the patient's family.  She may need a higher level of care such as medical ICU but I will leave this to her primary attending physician.  We will continue to follow her with you.   Lorretta Harp, M.D., [place], [facility], Laverta [place] [address] [place], [place]  [address]  [phone] 03/10/2020 4:39 PM

## 2020-03-10 NOTE — ED Triage Notes (Signed)
Pt here from [facility] for high heart rate and high CBG. Pt in afib RVR to 170 with some runs of vtach. Given 20 mg cardizem. 500 NS bolus given for CBG of 520. Per EMS ESRD/dialysis pt but facility unable to say last time she was dialyzed. Staff gave 22 Units of unknown type of insulin prior to EMS arrival.

## 2020-03-10 NOTE — Consult Note (Signed)
[place] Date: 03/10/2020 03/10/2020 Rexene Agent Requesting Physician:  Doristine Bosworth MD  Reason for Consult:  ESRD comanagement HPI:  [id number] ESRD [facility]S [facility] [place]; PMH including COPD on chronic oxygen therapy, paroxysmal atrial fibrillation on apixaban, chronic diastolic heart failure, history of aortic stenosis, rheumatoid arthritis, DM 2, hypertension, hyperlipidemia, history of seizures, depression/anxiety who presented to the ER today with dyspnea and elevated blood glucose.  At presentation patient found to have atrial fibrillation with RVR as high as a pulse of 170 with some brief runs of ventricular tachycardia.    Pt placed on diltiazem.  W/u CXR ?RLL PNA started on Vanc/Cefepime/Flagyl.  Initial labs with potassium 6.0, bicarbonate 21, glucose of 418.  LFTs, especially transaminases elevated.  Patient received calcium gluconate.    Patient is unable to provide further history.  At the time of my evaluation she is tachycardic, tachypneic, and not interactive.  From previous notes: Dialyzes at [facility]-TTS EDW 85.5.  4 hours HD [place] 2/2.5, Dialyzer unknown, Heparin does not appear to receive heparin with her treatment, only in the catheter. Access using TDC.    Creat (mg/dL)  Date Value  08/02/2018 2.54 (H)   Creatinine, Ser (mg/dL)  Date Value  03/10/2020 4.20 (H)  03/10/2020 4.40 (H)  03/07/2020 2.20 (H)  02/20/2020 2.90 (H)  01/31/2020 3.14 (H)  01/28/2020 5.00 (H)  01/25/2020 3.02 (H)  01/24/2020 4.00 (H)  01/23/2020 3.52 (H)  11/27/2019 4.83 (H)  ] ROS Unable to complete review of systems given patient's mental status  PMH  Past Medical History:  Diagnosis Date  . Anemia   . Angina pectoris ([place])   . Anxiety   . Asthma   . Atrial flutter ([place])   . Back pain   . Barrett esophagus   . Bifascicular block   . BMI 39.0-39.9,adult   . Cellulitis   . CHF (congestive heart failure) ([place])   . Chronic diastolic CHF (congestive heart failure)  ([place])   . Constipation   . Contact with and (suspected) exposure to covid-19   . COPD (chronic obstructive pulmonary disease) ([place])   . Dependence on renal dialysis ([place])   . Depression   . Dysphagia   . Dysuria   . Epilepsy ([place])   . ESRD on hemodialysis ([place])   . Essential hypertension   . Fibromyalgia   . Fibromyalgia   . GERD (gastroesophageal reflux disease)   . Gout   . History of pericarditis   . Hypercholesterolemia   . Hyperlipidemia   . Hypothyroid   . Hypothyroidism   . Insomnia   . Long term (current) use of insulin ([place])   . Major depressive disorder   . Memory loss   . Moderate protein-calorie malnutrition ([place])   . Morbid obesity ([place])   . Nephrolithiasis    2008  . Obesity hypoventilation syndrome (HCC)    CPAP  . Obstructive sleep apnea   . Pain in left arm   . PE (pulmonary embolism)    2010  . Peripheral neuropathy   . Persistent atrial fibrillation ([place])   . Pneumonia   . Polyp of nasal sinus   . PTSD (post-traumatic stress disorder)   . Pulmonary hypertension ([place])   . Rheumatoid arteritis ([place])   . Rheumatoid arthritis ([place])   . Schizophrenia ([place])   . 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 Seizures ([place])   . Steroid dependence ([place])   . Tachycardia, unspecified   . Type 2  diabetes mellitus ([place])   . Unspecified intellectual disabilities   . Unspecified psychosis not due to a substance or known physiological condition ([place])   . Urinary tract infection   . Vitamin D deficiency    PSH  Past Surgical History:  Procedure Laterality Date  . A/V FISTULAGRAM N/A 01/28/2020   Procedure: A/V FISTULAGRAM - Right [place];  Surgeon: Waynetta Sandy, MD;  Location: [facility];  Service: Cardiovascular;  Laterality: N/A;  . APPENDECTOMY    . AV FISTULA PLACEMENT Right 09/27/2019   Procedure: ARTERIOVENOUS (AV) FISTULA CREATION;  Surgeon: Waynetta Sandy, MD;  Location: [facility];  Service: Vascular;  Laterality: Right;  .  AV FISTULA PLACEMENT Right 02/20/2020   Procedure: INSERTION OF ARTERIOVENOUS (AV) GORE-TEX GRAFT ARM;  Surgeon: Waynetta Sandy, MD;  Location: [facility];  Service: Vascular;  Laterality: Right;  . CHOLECYSTECTOMY    . COLONOSCOPY     benign polyps (12/2000), repeat colonoscopy performed in 2007  . ENDOMETRIAL BIOPSY     10/03 benign columnar mucosa (limited material)  . INSERTION OF DIALYSIS CATHETER Right 09/14/2019   Procedure: ATTEMPTED INSERTION OF DIALYSIS CATHETER;  Surgeon: Virl Cagey, MD;  Location: [facility];  Service: General;  Laterality: Right;  . INSERTION OF DIALYSIS CATHETER Right 09/27/2019   Procedure: INSERTION OF DIALYSIS CATHETER;  Surgeon: Waynetta Sandy, MD;  Location: [facility];  Service: Vascular;  Laterality: Right;  . KIDNEY SURGERY    . REVISON OF ARTERIOVENOUS FISTULA Right 02/20/2020   Procedure: REVISON OF ARTERIOVENOUS FISTULA;  Surgeon: Waynetta Sandy, MD;  Location: [facility];  Service: Vascular;  Laterality: Right;   FH  Family History  Problem Relation Age of Onset  . Bone cancer Mother   . Hypertension Mother   . Heart disease Mother   . COPD Father   . Heart disease Father   . Stroke Father    [place]  reports that she quit smoking about 47 years ago. Her smoking use included cigarettes. She has never used smokeless tobacco. She reports that she does not drink alcohol and does not use drugs. Allergies  Allergies  Allergen Reactions  . Benztropine Mesylate Other (See Comments)    Alopecia and white spots on eyes  . Codeine Swelling, Palpitations and Other (See Comments)    Mouth Swelling and Tachycardia   . Diazepam Other (See Comments)    COMA  . Diphenhydramine Hcl Anxiety, Palpitations and Other (See Comments)    Tachycardia and anxiousness  . Penicillins Swelling  . Sulfonamide Derivatives Rash    Blisters, also  . Thioridazine Hcl Swelling  . Bee Venom     Documented on MAR  . Lisinopril Cough   Home  medications Prior to Admission medications   Medication Sig Start Date End Date Taking? Authorizing Provider  acetaminophen (TYLENOL) 500 MG tablet Take 500 mg by mouth 2 (two) times daily. At 0800 and 2000   Yes [provider]  albuterol (VENTOLIN HFA) 108 (90 Base) MCG/ACT inhaler Inhale 1 puff into the lungs every 6 (six) hours as needed for wheezing or shortness of breath.   Yes [provider]  Amino Acids (AMINO ACID PROTEIN PO) Take 30 mLs by mouth 2 (two) times daily. At 0800 and 2000   Yes [provider]  apixaban (ELIQUIS) 5 MG TABS tablet Take 1 tablet (5 mg total) by mouth 2 (two) times daily. Patient taking differently: Take 5 mg by mouth 2 (two) times daily. At  0800 and 2000 10/01/19  Yes Adhikari, Tamsen Meek, MD  ARIPiprazole (ABILIFY) 2 MG tablet Take 2 mg by mouth daily. At 0900 12/10/19  Yes [provider]  aspirin EC 81 MG tablet Take 1 tablet (81 mg total) by mouth daily with breakfast. Patient taking differently: Take 81 mg by mouth daily with breakfast. At 0800 01/25/20  Yes Emokpae, Courage, MD  atorvastatin (LIPITOR) 20 MG tablet Take 20 mg by mouth at bedtime. At 2000 05/14/19  Yes [provider]  buPROPion (WELLBUTRIN XL) 150 MG 24 hr tablet Take 150 mg by mouth daily. At 0800   Yes [provider]  calcitRIOL (ROCALTROL) 0.25 MCG capsule Take 1 capsule (0.25 mcg total) by mouth every Monday, Wednesday, and Friday. Patient taking differently: Take 0.25 mcg by mouth every Monday, Wednesday, and Friday. At 0800 10/01/19  Yes Shelly Coss, MD  Calcium Acetate 667 MG TABS Take 2,001 mg by mouth 3 (three) times daily with meals. At 0800, 1200, and 1600   Yes [provider]  calcium carbonate (TUMS - DOSED IN MG ELEMENTAL CALCIUM) 500 MG chewable tablet Chew 1 tablet by mouth every 6 (six) hours as needed for indigestion or heartburn.   Yes [provider]  cetaphil (CETAPHIL) lotion Apply 1 application  topically every 12 (twelve) hours as needed for dry skin. Apply to feet/legs   Yes [provider]  Cholecalciferol (VITAMIN D3) 50 MCG (2000 UT) TABS Take 4,000 Units by mouth daily. At 0800   Yes [provider]  Dextromethorphan-guaiFENesin 5-100 MG/5ML LIQD Take 5 mLs by mouth daily as needed (cough).   Yes [provider]  diltiazem (CARDIZEM CD) 360 MG 24 hr capsule Take 360 mg by mouth daily. At 0800   Yes [provider]  Dulaglutide (TRULICITY) 1.5 OQ/9.4TM SOPN Inject 1.5 mg into the skin every Friday. At 0800   Yes [provider]  DULoxetine (CYMBALTA) 60 MG capsule Take 60 mg by mouth daily. At 0800 05/11/19  Yes [provider]  EPINEPHrine (EPI-PEN) 0.3 mg/0.3 mL DEVI Inject 0.3 mg into the muscle once as needed (for bee stings, then go to the ED immediately after using).    Yes [provider]  gabapentin (NEURONTIN) 100 MG capsule Take 1 capsule (100 mg total) by mouth 3 (three) times daily. Patient taking differently: Take 100 mg by mouth 3 (three) times daily. At 0800, 1200, and 1700 10/01/19  Yes Adhikari, Tamsen Meek, MD  insulin aspart (NOVOLOG) 100 UNIT/ML injection Inject 10 Units into the skin 3 (three) times daily before meals. At 0800, 1200, and 1700 Additional units according to sliding scale 151-200 = 2 units 201-250 = 4 units 251-300 = 6 units 301-350 = 8 units 351-400 = 10units 401-450 = 12units >400 = call MD   Yes [provider]  levETIRAcetam (KEPPRA) 250 MG tablet Take 250 mg by mouth 2 (two) times daily. At 0800 and 2000   Yes [provider]  levothyroxine (SYNTHROID) 50 MCG tablet Take 50 mcg by mouth daily before breakfast. At 0800   Yes [provider]  metoprolol succinate (TOPROL-XL) 200 MG 24 hr tablet Take 1 tablet (200 mg total) by mouth daily. Take with or immediately following a meal. Patient taking differently: Take 200 mg by mouth daily. Take with or immediately  following a meal. At 0800 11/27/19  Yes Tat, Shanon Brow, MD  montelukast (SINGULAIR) 10 MG tablet Take 10 mg by mouth at bedtime. At 2000   Yes [provider]  Multiple Vitamin (DAILY VITE PO) Take 1 tablet by mouth daily. At 0800   Yes [provider]  nitroGLYCERIN (NITROSTAT) 0.4 MG SL tablet Place 0.4 mg under the tongue every 5 (five) minutes x 3 doses as needed for chest pain.  01/13/20  Yes [provider]  omeprazole (PRILOSEC) 20 MG capsule Take 20 mg by mouth in the morning. At 0600   Yes [provider]  ondansetron (ZOFRAN) 4 MG tablet Take 4 mg by mouth every 6 (six) hours as needed for nausea or vomiting.   Yes [provider]  predniSONE (DELTASONE) 10 MG tablet Take 10 mg by mouth daily. At 0800   Yes [provider]  primidone (MYSOLINE) 50 MG tablet Take 50 mg by mouth daily. At 0800   Yes [provider]  pyridoxine (B-6) 200 MG tablet Take 200 mg by mouth in the morning and at bedtime. At 0800 and 2000   Yes [provider]  senna (SENOKOT) 8.6 MG tablet Take 1 tablet by mouth 2 (two) times daily. At 0800 and 2000   Yes [provider]  traZODone (DESYREL) 50 MG tablet Take 50 mg by mouth at bedtime. At 2000 05/11/19  Yes [provider]  traMADol (ULTRAM) 50 MG tablet Take 1 tablet (50 mg total) by mouth every 8 (eight) hours as needed. Patient not taking: Reported on 03/10/2020 02/20/20   Gabriel Earing, PA-C    Current Medications Scheduled Meds: . apixaban  5 mg Oral BID  . ARIPiprazole  2 mg Oral Daily  . [START ON 03/11/2020] aspirin EC  81 mg Oral Q breakfast  . atorvastatin  20 mg Oral QHS  . buPROPion  150 mg Oral Daily  . calcitRIOL  0.25 mcg Oral Q M,W,F  . calcium acetate  2,001 mg Oral TID WC  . DULoxetine  60 mg Oral Daily  . gabapentin  100 mg Oral TID  . insulin aspart  0-15 Units Subcutaneous TID WC  . insulin aspart  0-5 Units Subcutaneous QHS  . levETIRAcetam  250 mg  Oral BID  . [START ON 03/11/2020] levothyroxine  50 mcg Oral QAC breakfast  . metoprolol  200 mg Oral Daily  . montelukast  10 mg Oral QHS  . [START ON 03/11/2020] pantoprazole  40 mg Oral Daily  . predniSONE  10 mg Oral Daily  . primidone  50 mg Oral Daily  . traZODone  50 mg Oral QHS  . [START ON 03/11/2020] vancomycin variable dose per unstable renal function (pharmacist dosing)   Does not apply See admin instructions   Continuous Infusions: . [START ON 03/11/2020] ceFEPime (MAXIPIME) IV    . diltiazem (CARDIZEM) infusion 7.5 mg/hr (03/10/20 1402)  . vancomycin     PRN Meds:.acetaminophen, albuterol, calcium carbonate, nitroGLYCERIN, ondansetron (ZOFRAN) IV  CBC Recent Labs  Lab 03/07/20 0044 03/07/20 0044 03/10/20 1032 03/10/20 1048 03/10/20 1050  WBC 9.3  --  16.9*  --   --   NEUTROABS 7.0  --   --   --   --   HGB 9.8*   < > 11.2* 12.2 11.9*  HCT 29.7*   < > 35.9* 36.0 35.0*  MCV 110.8*  --  114.7*  --   --   PLT 169  --  138*  --   --    < > = values in this interval not displayed.   Basic Metabolic Panel Recent Labs  Lab 03/07/20 0044 03/10/20 1032  03/10/20 1048 03/10/20 1050  NA 137 138 139 139  K 3.0* 6.0* 5.8* 5.9*  CL 101 102 102  --   CO2 28 21*  --   --   GLUCOSE 255* 418* 420*  --   BUN 24* 53* 80*  --   CREATININE 2.20* 4.40* 4.20*  --   CALCIUM 8.5* 9.4  --   --     Physical Exam  Blood pressure 112/72, pulse (!) 144, temperature 99.5 F (37.5 C), temperature source Oral, resp. rate (!) 30, SpO2 96 %. GEN: In respiratory distress, moderate ENT: NCAT EYES: EOMI CV: Tachycardic, irregular, no rub PULM: Coarse bilaterally, increased work of breathing, tachypneic ABD: Soft SKIN: Left IJ TDC exit site without erythema, purulence EXT: No significant edema  Assessment 22F ESRD THS [facility] with HAP, Sepsis, AFib with RVR, Hyperkalemia, hyperglycemia  1. ESRD THS L IJ TDC  2. Hyperkalemia, mild 3. HAP of RLL on Vanc/Cefepime/Flagyl per  TRH 4. Sepsis 5. Hyperglycemia / DM2 6. Inc AST/ALT 7. Recent AVF, needs surgical revision, VVS follows  Plan 1. She seems hemodynamically unstable for dialysis at the current time, continue supportive care 2. HD tomorrow if remains stable: 4h, 3L UF, 2K, No hep. 400/600, TDC 3. Lokelma 10 g twice daily, plan for dialysis tomorrow unless clinical status changes 4. Treat hyperglycemia, this will help her potassium quite a bit 5. P.m. labs ordered 6. Daily weights, Daily Renal Panel, Strict I/Os, Avoid nephrotoxins (NSAIDs, judicious IV Contrast)    Rexene Agent   03/10/2020, 2:16 PM

## 2020-03-10 NOTE — ED Notes (Signed)
Straight cath attempted and was unsuccessful by this RN.

## 2020-03-10 NOTE — Progress Notes (Signed)
Pharmacy Antibiotic Note  [name] is a 69 y.o. female admitted on 03/10/2020 with sepsis.  Pharmacy has been consulted for vancomycin/cefepime dosing. ESRD on HD TTS - last HD session date unclear.  Aztreonam initially ordered for hx "swelling" PCN allergy but changed to cefepime based on pharmacy protocol due to patient tolerating cefepime here 01/25/2020 with no reported issues.  Plan: Cefepime 2g IV x 1; then 1g IV q24h Vancomycin 2g IV x1; then Vancomycin 1g IV qHD Flagyl 500mg  IV x 1 per EDP - f/u if to continue Monitor clinical progress, c/s, abx plan/LOT Pre-HD vancomycin level as indicated F/u HD schedule/tolerance inpatient to enter vancomycin maintenance doses       Temp (24hrs), Avg:99.5 F (37.5 C), Min:99.5 F (37.5 C), Max:99.5 F (37.5 C)  Recent Labs  Lab 03/07/20 0044 03/10/20 1032 03/10/20 1048  WBC 9.3 16.9*  --   CREATININE 2.20* 4.40* 4.20*    Estimated Creatinine Clearance: 13.4 mL/min (A) (by C-G formula based on SCr of 4.2 mg/dL (H)).    Allergies  Allergen Reactions  . Benztropine Mesylate Other (See Comments)    Alopecia and white spots on eyes  . Codeine Swelling, Palpitations and Other (See Comments)    Mouth Swelling and Tachycardia   . Diazepam Other (See Comments)    COMA  . Diphenhydramine Hcl Anxiety, Palpitations and Other (See Comments)    Tachycardia and anxiousness  . Penicillins Swelling  . Sulfonamide Derivatives Rash    Blisters, also  . Thioridazine Hcl Swelling  . Bee Venom     Documented on MAR  . Lisinopril Cough    Antimicrobials this admission: 7/26 vancomycin >>  7/26 cefepime >>  7/26 flagyl x 1  Dose adjustments this admission:   Microbiology results:   Arturo Morton, PharmD, BCPS Please check AMION for all [place] contact numbers Clinical Pharmacist 03/10/2020 11:31 AM

## 2020-03-11 ENCOUNTER — Other Ambulatory Visit: Payer: Self-pay

## 2020-03-11 ENCOUNTER — Inpatient Hospital Stay (HOSPITAL_COMMUNITY): Payer: Medicare Other

## 2020-03-11 DIAGNOSIS — A419 Sepsis, unspecified organism: Secondary | ICD-10-CM

## 2020-03-11 DIAGNOSIS — N186 End stage renal disease: Secondary | ICD-10-CM

## 2020-03-11 DIAGNOSIS — R6521 Severe sepsis with septic shock: Secondary | ICD-10-CM

## 2020-03-11 DIAGNOSIS — B952 Enterococcus as the cause of diseases classified elsewhere: Secondary | ICD-10-CM | POA: Diagnosis not present

## 2020-03-11 DIAGNOSIS — I340 Nonrheumatic mitral (valve) insufficiency: Secondary | ICD-10-CM

## 2020-03-11 DIAGNOSIS — I361 Nonrheumatic tricuspid (valve) insufficiency: Secondary | ICD-10-CM

## 2020-03-11 DIAGNOSIS — R7881 Bacteremia: Secondary | ICD-10-CM

## 2020-03-11 DIAGNOSIS — I4891 Unspecified atrial fibrillation: Secondary | ICD-10-CM | POA: Diagnosis not present

## 2020-03-11 DIAGNOSIS — R0602 Shortness of breath: Secondary | ICD-10-CM | POA: Diagnosis not present

## 2020-03-11 DIAGNOSIS — J9621 Acute and chronic respiratory failure with hypoxia: Secondary | ICD-10-CM | POA: Diagnosis not present

## 2020-03-11 DIAGNOSIS — A4181 Sepsis due to Enterococcus: Secondary | ICD-10-CM | POA: Diagnosis not present

## 2020-03-11 DIAGNOSIS — G92 Toxic encephalopathy: Secondary | ICD-10-CM

## 2020-03-11 DIAGNOSIS — I5033 Acute on chronic diastolic (congestive) heart failure: Secondary | ICD-10-CM | POA: Diagnosis not present

## 2020-03-11 LAB — BLOOD CULTURE ID PANEL (REFLEXED)
Acinetobacter baumannii: NOT DETECTED
Candida albicans: NOT DETECTED
Candida glabrata: NOT DETECTED
Candida krusei: NOT DETECTED
Candida parapsilosis: NOT DETECTED
Candida tropicalis: NOT DETECTED
Enterobacter cloacae complex: NOT DETECTED
Enterobacteriaceae species: NOT DETECTED
Enterococcus species: DETECTED — AB
Escherichia coli: NOT DETECTED
Haemophilus influenzae: NOT DETECTED
Klebsiella oxytoca: NOT DETECTED
Klebsiella pneumoniae: NOT DETECTED
Listeria monocytogenes: NOT DETECTED
Neisseria meningitidis: NOT DETECTED
Proteus species: NOT DETECTED
Pseudomonas aeruginosa: NOT DETECTED
Serratia marcescens: NOT DETECTED
Staphylococcus aureus (BCID): NOT DETECTED
Staphylococcus species: NOT DETECTED
Streptococcus agalactiae: NOT DETECTED
Streptococcus pneumoniae: NOT DETECTED
Streptococcus pyogenes: NOT DETECTED
Streptococcus species: NOT DETECTED
Vancomycin resistance: NOT DETECTED

## 2020-03-11 LAB — ECHOCARDIOGRAM COMPLETE
S' Lateral: 4.4 cm
Weight: 3089.97 oz

## 2020-03-11 LAB — GLUCOSE, CAPILLARY
Glucose-Capillary: 143 mg/dL — ABNORMAL HIGH (ref 70–99)
Glucose-Capillary: 261 mg/dL — ABNORMAL HIGH (ref 70–99)
Glucose-Capillary: 295 mg/dL — ABNORMAL HIGH (ref 70–99)
Glucose-Capillary: 298 mg/dL — ABNORMAL HIGH (ref 70–99)
Glucose-Capillary: 56 mg/dL — ABNORMAL LOW (ref 70–99)
Glucose-Capillary: 96 mg/dL (ref 70–99)

## 2020-03-11 LAB — PROTIME-INR
INR: 2.3 — ABNORMAL HIGH (ref 0.8–1.2)
Prothrombin Time: 24.3 seconds — ABNORMAL HIGH (ref 11.4–15.2)

## 2020-03-11 LAB — RENAL FUNCTION PANEL
Albumin: 2.9 g/dL — ABNORMAL LOW (ref 3.5–5.0)
Albumin: 2.8 g/dL — ABNORMAL LOW (ref 3.5–5.0)
Anion gap: 14 (ref 5–15)
Anion gap: 12 (ref 5–15)
BUN: 36 mg/dL — ABNORMAL HIGH (ref 8–23)
BUN: 25 mg/dL — ABNORMAL HIGH (ref 8–23)
CO2: 21 mmol/L — ABNORMAL LOW (ref 22–32)
CO2: 21 mmol/L — ABNORMAL LOW (ref 22–32)
Calcium: 8.3 mg/dL — ABNORMAL LOW (ref 8.9–10.3)
Calcium: 8.3 mg/dL — ABNORMAL LOW (ref 8.9–10.3)
Chloride: 105 mmol/L (ref 98–111)
Chloride: 102 mmol/L (ref 98–111)
Creatinine, Ser: 2.61 mg/dL — ABNORMAL HIGH (ref 0.44–1.00)
Creatinine, Ser: 1.96 mg/dL — ABNORMAL HIGH (ref 0.44–1.00)
GFR calc Af Amer: 21 mL/min — ABNORMAL LOW (ref >60)
GFR calc Af Amer: 30 mL/min — ABNORMAL LOW (ref >60)
GFR calc non Af Amer: 18 mL/min — ABNORMAL LOW (ref >60)
GFR calc non Af Amer: 26 mL/min — ABNORMAL LOW (ref >60)
Glucose, Bld: 111 mg/dL — ABNORMAL HIGH (ref 70–99)
Glucose, Bld: 320 mg/dL — ABNORMAL HIGH (ref 70–99)
Phosphorus: 3.2 mg/dL (ref 2.5–4.6)
Phosphorus: 2.9 mg/dL (ref 2.5–4.6)
Potassium: 3.8 mmol/L (ref 3.5–5.1)
Potassium: 3.8 mmol/L (ref 3.5–5.1)
Sodium: 140 mmol/L (ref 135–145)
Sodium: 135 mmol/L (ref 135–145)

## 2020-03-11 LAB — CBC
HCT: 35.4 % — ABNORMAL LOW (ref 36.0–46.0)
Hemoglobin: 11.2 g/dL — ABNORMAL LOW (ref 12.0–15.0)
MCH: 36.5 pg — ABNORMAL HIGH (ref 26.0–34.0)
MCHC: 31.6 g/dL (ref 30.0–36.0)
MCV: 115.3 fL — ABNORMAL HIGH (ref 80.0–100.0)
Platelets: 171 10*3/uL (ref 150–400)
RBC: 3.07 MIL/uL — ABNORMAL LOW (ref 3.87–5.11)
RDW: 16.5 % — ABNORMAL HIGH (ref 11.5–15.5)
WBC: 20.9 10*3/uL — ABNORMAL HIGH (ref 4.0–10.5)
nRBC: 0.3 % — ABNORMAL HIGH (ref 0.0–0.2)

## 2020-03-11 LAB — LIPID PANEL
Cholesterol: 104 mg/dL (ref 0–200)
HDL: 51 mg/dL (ref >40)
LDL Cholesterol: 42 mg/dL (ref 0–99)
Total CHOL/HDL Ratio: 2 RATIO
Triglycerides: 56 mg/dL (ref <150)
VLDL: 11 mg/dL (ref 0–40)

## 2020-03-11 LAB — CORTISOL: Cortisol, Plasma: 42.4 ug/dL

## 2020-03-11 LAB — MAGNESIUM: Magnesium: 2.1 mg/dL (ref 1.7–2.4)

## 2020-03-11 LAB — APTT
aPTT: 98 seconds — ABNORMAL HIGH (ref 24–36)
aPTT: 107 seconds — ABNORMAL HIGH (ref 24–36)
aPTT: 77 seconds — ABNORMAL HIGH (ref 24–36)

## 2020-03-11 LAB — MRSA PCR SCREENING: MRSA by PCR: NEGATIVE

## 2020-03-11 LAB — PROCALCITONIN: Procalcitonin: 8.99 ng/mL

## 2020-03-11 LAB — HEPARIN LEVEL (UNFRACTIONATED): Heparin Unfractionated: >2.2 IU/mL — ABNORMAL HIGH (ref 0.30–0.70)

## 2020-03-11 IMAGING — DX DG CHEST 1V PORT SAME DAY
1 series · 1 of 1 positions shown · non-contrast
Comparison: 09/17/2019

CLINICAL DATA: Postoperative state.

EXAM:
PORTABLE CHEST 1 VIEW

[chest ap]
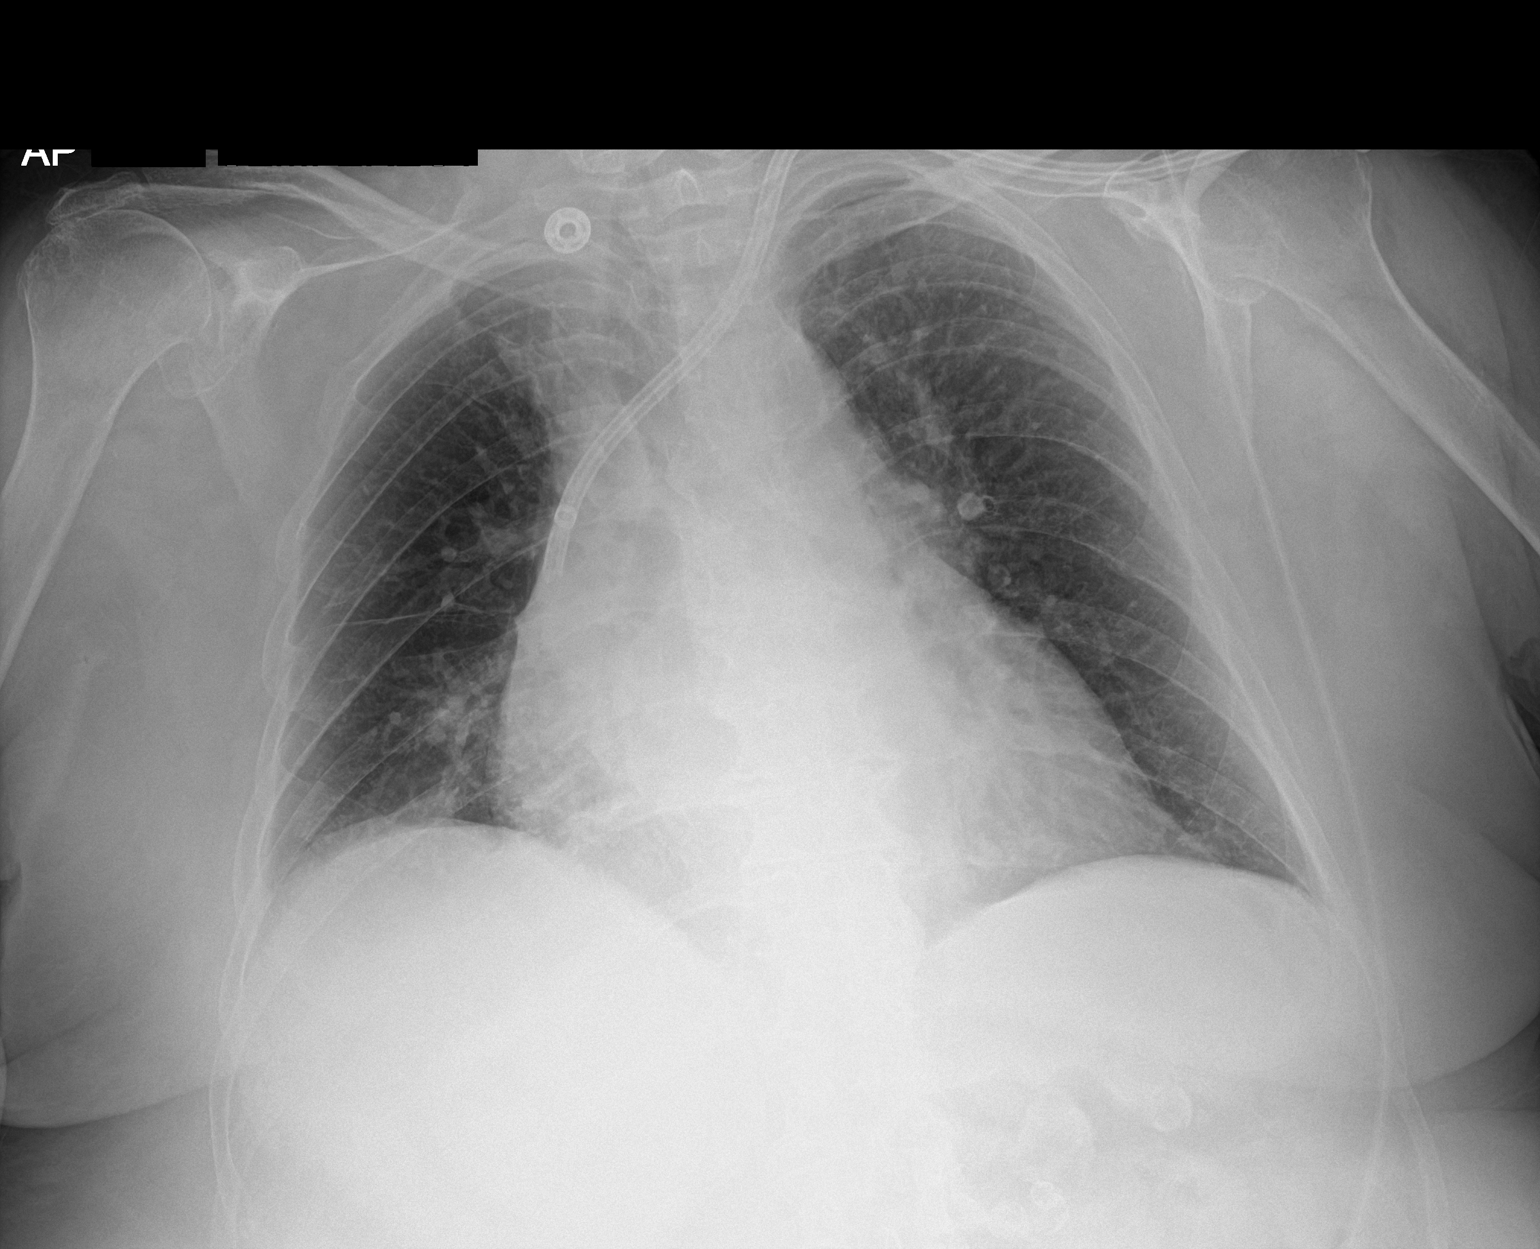

[1 of 1 positions shown; findings below may reference images not displayed]

FINDINGS: Left jugular dialysis catheter in place. The catheter tip is in the
upper SVC region. Heart size is upper limits of normal but stable.
Improved aeration in both lungs compared to the previous
examination. No significant airspace disease. Central vascular
structures are mildly enlarged. Negative for a pneumothorax.
IMPRESSION: 1. Placement of left jugular dialysis catheter. Catheter tip in the
upper SVC region.
2. Mildly prominent central vascular structures without pulmonary
edema. Negative for a pneumothorax.

## 2020-03-11 MED ORDER — DEXTROSE 50 % IV SOLN
INTRAVENOUS | Status: AC
  Administered 2020-03-11: 50 mL
  Filled 2020-03-11: qty 50

## 2020-03-11 MED ORDER — ENSURE ENLIVE PO LIQD
237.0000 mL | Freq: Two times a day (BID) | ORAL | Status: DC
  Administered 2020-03-11 – 2020-03-17 (×7): 237 mL via ORAL

## 2020-03-11 MED ORDER — SODIUM CHLORIDE 0.9% FLUSH
10.0000 mL | Freq: Two times a day (BID) | INTRAVENOUS | Status: DC
  Administered 2020-03-11 (×2): 10 mL
  Administered 2020-03-12: 30 mL
  Administered 2020-03-12 – 2020-03-26 (×20): 10 mL

## 2020-03-11 MED ORDER — SODIUM CHLORIDE 0.9% FLUSH
10.0000 mL | INTRAVENOUS | Status: DC | PRN
  Administered 2020-03-18: 10 mL

## 2020-03-11 MED ORDER — PHENYLEPHRINE CONCENTRATED 100MG/250ML (0.4 MG/ML) INFUSION SIMPLE
0.0000 ug/min | INTRAVENOUS | Status: DC
  Administered 2020-03-11: 170 ug/min via INTRAVENOUS
  Administered 2020-03-11: 125 ug/min via INTRAVENOUS
  Filled 2020-03-11 (×5): qty 250

## 2020-03-11 MED ORDER — RESOURCE THICKENUP CLEAR PO POWD
Freq: Once | ORAL | Status: AC
  Filled 2020-03-11: qty 125

## 2020-03-11 MED ORDER — PRISMASOL BGK 4/2.5 32-4-2.5 MEQ/L IV SOLN: INTRAVENOUS | Status: DC

## 2020-03-11 MED ORDER — RENA-VITE PO TABS
1.0000 | ORAL_TABLET | Freq: Every day | ORAL | Status: DC
  Administered 2020-03-11 – 2020-03-24 (×14): 1 via ORAL
  Filled 2020-03-11 (×14): qty 1

## 2020-03-11 MED ORDER — BISACODYL 10 MG RE SUPP
10.0000 mg | Freq: Once | RECTAL | Status: AC
  Administered 2020-03-11: 10 mg via RECTAL
  Filled 2020-03-11: qty 1

## 2020-03-11 MED ORDER — ORAL CARE MOUTH RINSE
15.0000 mL | Freq: Two times a day (BID) | OROMUCOSAL | Status: DC
  Administered 2020-03-11 – 2020-03-26 (×23): 15 mL via OROMUCOSAL

## 2020-03-11 MED ORDER — CHLORHEXIDINE GLUCONATE 0.12 % MT SOLN
15.0000 mL | Freq: Two times a day (BID) | OROMUCOSAL | Status: DC
  Administered 2020-03-11: 15 mL via OROMUCOSAL
  Filled 2020-03-11: qty 15

## 2020-03-11 MED ORDER — LORAZEPAM 2 MG/ML IJ SOLN
1.0000 mg | Freq: Once | INTRAMUSCULAR | Status: AC
  Administered 2020-03-11: 1 mg via INTRAVENOUS
  Filled 2020-03-11: qty 1

## 2020-03-11 MED ORDER — DOCUSATE SODIUM 100 MG PO CAPS
100.0000 mg | ORAL_CAPSULE | Freq: Every day | ORAL | Status: DC | PRN
  Administered 2020-03-11: 100 mg via ORAL
  Filled 2020-03-11: qty 1

## 2020-03-11 MED ORDER — LINEZOLID 600 MG PO TABS
600.0000 mg | ORAL_TABLET | Freq: Two times a day (BID) | ORAL | Status: DC
  Administered 2020-03-11 – 2020-03-13 (×5): 600 mg via ORAL
  Filled 2020-03-11 (×5): qty 1

## 2020-03-11 MED ORDER — PRIMIDONE 50 MG PO TABS
50.0000 mg | ORAL_TABLET | Freq: Every day | ORAL | Status: DC
  Administered 2020-03-11 – 2020-03-26 (×16): 50 mg via ORAL
  Filled 2020-03-11 (×16): qty 1

## 2020-03-11 MED ORDER — PERFLUTREN LIPID MICROSPHERE
INTRAVENOUS | Status: AC
  Administered 2020-03-11: 2.5 mL via INTRAVENOUS
  Filled 2020-03-11: qty 10

## 2020-03-11 MED ORDER — ONDANSETRON HCL 4 MG/2ML IJ SOLN
4.0000 mg | Freq: Once | INTRAMUSCULAR | Status: AC
  Administered 2020-03-11: 4 mg via INTRAVENOUS
  Filled 2020-03-11: qty 2

## 2020-03-11 MED ORDER — ORAL CARE MOUTH RINSE: 15.0000 mL | Freq: Two times a day (BID) | OROMUCOSAL | Status: DC

## 2020-03-11 MED ORDER — "THROMBI-PAD 3""X3"" EX PADS"
1.0000 | MEDICATED_PAD | Freq: Once | CUTANEOUS | Status: AC
  Administered 2020-03-11: 1 via TOPICAL
  Filled 2020-03-11: qty 1

## 2020-03-11 NOTE — Progress Notes (Signed)
RN stated she had taken pt off BiPAP around 0900 AM and placed pt on 2LNC. Pt tolerating well at this time. Pt is eating lunch at this time.

## 2020-03-11 NOTE — Progress Notes (Addendum)
ANTICOAGULATION CONSULT NOTE - Follow Up Consult  Pharmacy Consult for heparin Indication: atrial fibrillation  Labs: Recent Labs    03/10/20 1032 03/10/20 1032 03/10/20 1048 03/10/20 1048 03/10/20 1050 03/10/20 1112 03/10/20 1323 03/11/20 0335  HGB 11.2*   < > 12.2   < > 11.9*  --   --  11.2*  HCT 35.9*   < > 36.0  --  35.0*  --   --  35.4*  PLT 138*  --   --   --   --   --   --  171  APTT  --   --   --   --   --  33  --  98*  LABPROT  --   --   --   --   --  23.0*  --  24.3*  INR  --   --   --   --   --  2.1*  --  2.3*  HEPARINUNFRC  --   --   --   --   --   --   --  >2.20*  CREATININE 4.40*  --  4.20*  --   --   --   --  2.61*  TROPONINIHS 42*  --   --   --   --   --  56*  --    < > = values in this interval not displayed.    Assessment: 69yo female therapeutic on heparin with initial dosing while Eliquis on hold though at upper end of goal and pt has experienced some bleeding after having CVC placed, improved after thrombi-pad application.  Goal of Therapy:  Heparin level 66-102 units/ml   Plan:  Will decrease heparin gtt slightly to 1000 units/hr and check PTT in 8 hours.    Wynona Neat, PharmD, BCPS  03/11/2020,4:52 AM   Addendum: RN reports that CVC site is still oozing, requiring second dressing change. Given no improvement in oozing, will decrease heparin again slightly to 950 units/hr.  VB 6:30 AM

## 2020-03-11 NOTE — Progress Notes (Signed)
Inpatient Diabetes Program Recommendations  AACE/ADA: New Consensus Statement on Inpatient Glycemic Control (2015)  Target Ranges:  Prepandial:   less than 140 mg/dL      Peak postprandial:   less than 180 mg/dL (1-2 hours)      Critically ill patients:  140 - 180 mg/dL   Lab Results  Component Value Date   GLUCAP 143 (H) 03/11/2020   HGBA1C 8.0 (H) 03/10/2020    Review of Glycemic Control Results for [name], [name] (MRN [id number]) as of 03/11/2020 12:11  Ref. Range 03/10/2020 18:46 03/10/2020 21:09 03/11/2020 07:56 03/11/2020 08:23 03/11/2020 11:18  Glucose-Capillary Latest Ref Range: 70 - 99 mg/dL 270 (H) 238 (H) 56 (L) 96 143 (H)   Diabetes history: DM 2 Outpatient Diabetes medications:  Novolog 10 units tid with meals, Trulicity 1.5 weekly Current orders for Inpatient glycemic control:  Novolog moderate tid with meals and HS Inpatient Diabetes Program Recommendations:   Please reduce Novolog correction to sensitive tid with meals. Consider adding Levemir 5 units daily as well.   Thanks  Adah Perl, RN, BC-ADM Inpatient Diabetes Coordinator Pager [phone] (8a-5p)

## 2020-03-11 NOTE — Evaluation (Signed)
Occupational Therapy Evaluation Patient Details Name: [name] MRN: [id number] DOB: January 16, 1951 Today's Date: 03/11/2020    History of Present Illness 69 yo admitted from SNF with SOB and AFib with RVR, decompensated heart failure and septic shock with metabolic encephalopathy. PMHx: CHF, AFib, ESRD, RA, Schizophrenia   Clinical Impression   This 69 y/o female presents with the above. PTA pt residing in SNF, receiving assist for ADL tasks and reports ambulatory with RW with additional assist and wheelchair follow. Pt currently presenting with overall weakness and deconditioning, decreased mobility status. Use of bed egress to chair position during session, pt able to perform x2 sit<>stand with modA+2; she currently requires up to [place] for LB ADL. Pt very pleasant and is motivated to return to her PLOF. She will benefit from continued acute OT services and currently recommend follow up OT services in SNF setting to maximize her overall safety and independence with ADL and mobility.   BP start of session: 108/89 During session seated in chair egress: 121/85 End of session: 125/83 SpO2 >92% on RA Pt in Afib with HR up to the 120s     Follow Up Recommendations  SNF    Equipment Recommendations  Other (comment) (defer to next venue)           Precautions / Restrictions Precautions Precautions: Fall Precaution Comments: CRRT Restrictions Weight Bearing Restrictions: No      Mobility Bed Mobility Overal bed mobility: Needs Assistance             General bed mobility comments: use of bed egress to chair position, pt able to move trunk towards unsupported sitting, able to maintain static balance with close minguard assist for balance and safety given pt's short stature  Transfers Overall transfer level: Needs assistance Equipment used: 2 person hand held assist Transfers: Sit to/from Stand Sit to Stand: Mod assist;+2 physical assistance;+2 safety/equipment          General transfer comment: performed x2 via face to face method and bil knee blocked/guarding, assist to boost and for hip extension, able to maintain static standing with +2 assist approx 15-30 sec each round    Balance Overall balance assessment: Needs assistance Sitting-balance support: Feet supported Sitting balance-Leahy Scale: Fair Sitting balance - Comments: close minguard for sitting blance    Standing balance support: Bilateral upper extremity supported Standing balance-Leahy Scale: Poor Standing balance comment: UE support and external assist                            ADL either performed or assessed with clinical judgement   ADL Overall ADL's : Needs assistance/impaired Eating/Feeding: NPO   Grooming: Minimal assistance;Sitting   Upper Body Bathing: Minimal assistance;Sitting   Lower Body Bathing: Maximal assistance;Total assistance;+2 for physical assistance;Sit to/from stand   Upper Body Dressing : Moderate assistance;Sitting   Lower Body Dressing: Total assistance;+2 for physical assistance;Sit to/from stand Lower Body Dressing Details (indicate cue type and reason): modA+2 to stand, totalA for socks      Toileting- Clothing Manipulation and Hygiene: Total assistance;+2 for physical assistance;Sit to/from stand       Functional mobility during ADLs: Moderate assistance;+2 for physical assistance;+2 for safety/equipment (sit<>stand )       Vision         Perception     Praxis      Pertinent Vitals/Pain Pain Assessment: No/denies pain     Hand Dominance Right   Extremity/Trunk Assessment Upper  Extremity Assessment Upper Extremity Assessment: RUE deficits/detail;Generalized weakness RUE Deficits / Details: limited shoulder ROM, pt reports due to previous fall, unable to achieve full elbow extension  RUE Coordination: decreased gross motor LUE Coordination: decreased gross motor   Lower Extremity Assessment Lower Extremity Assessment:  Defer to PT evaluation       Communication Communication Communication: No difficulties   Cognition Arousal/Alertness: Awake/alert Behavior During Therapy: WFL for tasks assessed/performed Overall Cognitive Status: Within Functional Limits for tasks assessed                                 General Comments: for basic tasks assessed today   General Comments       Exercises Exercises: General Upper Extremity General Exercises - Upper Extremity Shoulder Flexion: AAROM;Right;10 reps Elbow Flexion: AROM;AAROM;Both;10 reps;Seated   Shoulder Instructions      Home Living Family/patient expects to be discharged to:: Assisted living [facility])                             Home Equipment: Gilford Rile - 2 wheels          Prior Functioning/Environment Level of Independence: Needs assistance  Gait / Transfers Assistance Needed: pt reports using RW for ambulation with assistance, typically with w/c follow during mobility ADL's / Homemaking Assistance Needed: receives assist from staff for all ADL, full shower x2/week            OT Problem List: Decreased strength;Decreased range of motion;Decreased activity tolerance;Impaired balance (sitting and/or standing);Decreased safety awareness;Cardiopulmonary status limiting activity;Impaired UE functional use;Decreased knowledge of use of DME or AE      OT Treatment/Interventions: Self-care/ADL training;Therapeutic exercise;Energy conservation;DME and/or AE instruction;Therapeutic activities;Cognitive remediation/compensation;Patient/family education;Balance training    OT Goals(Current goals can be found in the care plan section) Acute Rehab OT Goals Patient Stated Goal: return to facility, return to walking OT Goal Formulation: With patient Time For Goal Achievement: 03/25/20 Potential to Achieve Goals: Good  OT Frequency: Min 2X/week   Barriers to D/C:            Co-evaluation PT/OT/SLP  Co-Evaluation/Treatment: Yes Reason for Co-Treatment: Complexity of the patient's impairments (multi-system involvement);For patient/therapist safety;To address functional/ADL transfers   OT goals addressed during session: Strengthening/ROM      AM-PAC OT "6 Clicks" Daily Activity     Outcome Measure Help from another person eating meals?: A Little Help from another person taking care of personal grooming?: A Little Help from another person toileting, which includes using toliet, bedpan, or urinal?: Total Help from another person bathing (including washing, rinsing, drying)?: A Lot Help from another person to put on and taking off regular upper body clothing?: A Lot Help from another person to put on and taking off regular lower body clothing?: Total 6 Click Score: 12   End of Session Equipment Utilized During Treatment: Oxygen Nurse Communication: Mobility status (CVP with oozing blood (RN already aware))  Activity Tolerance: Patient tolerated treatment well Patient left: in bed;with call bell/phone within reach  OT Visit Diagnosis: Muscle weakness (generalized) (M62.81);Unsteadiness on feet (R26.81)                Time: 0539-7673 OT Time Calculation (min): 28 min Charges:  OT General Charges $OT Visit: 1 Visit OT Evaluation $OT Eval High Complexity: 1 High  Lou Cal, OT Acute Rehabilitation Services Pager [phone] Office [phone]  Raymondo Band 03/11/2020, 11:59 AM

## 2020-03-11 NOTE — TOC Initial Note (Signed)
Transition of Care [facility]) - Initial/Assessment Note    Patient Details  Name: [name] MRN: [id number] Date of Birth: 06-25-1951  Transition of Care [facility]) CM/SW Contact:    Trula Ore, [place] Phone Number: 03/11/2020, 2:31 PM  Clinical Narrative:                  CSW spoke with patient at bedside. Patient is agreeable to going back to [facility]. Patient is long term resident there. CSW called  [facility] who confirmed patient is long term there.   Plan is for patient to go back to [facility] when medically ready.  TOC team will continue to follow.  Expected Discharge Plan: Skilled Nursing Facility Barriers to Discharge: Continued Medical Work up   Patient Goals and CMS Choice Patient states their goals for this hospitalization and ongoing recovery are:: to go to SNF CMS Medicare.gov Compare Post Acute Care list provided to:: Patient Choice offered to / list presented to : Patient  Expected Discharge Plan and Services Expected Discharge Plan: [place]       Living arrangements for the past 2 months: [place]                                      Prior Living Arrangements/Services Living arrangements for the past 2 months: [place] Lives with:: Self, Facility Resident Patient language and need for interpreter reviewed:: Yes Do you feel safe going back to the place where you live?: Yes      Need for Family Participation in Patient Care: Yes (Comment) Care giver support system in place?: Yes (comment)   Criminal Activity/Legal Involvement Pertinent to Current Situation/Hospitalization: No - Comment as needed  Activities of Daily Living Home Assistive Devices/Equipment: Cane (specify quad or straight), Wheelchair, Environmental consultant (specify type) ADL Screening (condition at time of admission) Patient's cognitive ability adequate to safely complete daily activities?: Yes Is the patient deaf or have difficulty  hearing?: No Does the patient have difficulty seeing, even when wearing glasses/contacts?: No Does the patient have difficulty concentrating, remembering, or making decisions?: No Patient able to express need for assistance with ADLs?: Yes Does the patient have difficulty dressing or bathing?: No Independently performs ADLs?: No Communication: Independent Dressing (OT): Needs assistance Is this a change from baseline?: Pre-admission baseline Grooming: Needs assistance Is this a change from baseline?: Pre-admission baseline Feeding: Needs assistance Is this a change from baseline?: Pre-admission baseline Bathing: Needs assistance Is this a change from baseline?: Pre-admission baseline Toileting: Needs assistance Is this a change from baseline?: Pre-admission baseline In/Out Bed: Needs assistance Is this a change from baseline?: Pre-admission baseline Walks in Home: Needs assistance Is this a change from baseline?: Pre-admission baseline Does the patient have difficulty walking or climbing stairs?: Yes Weakness of Legs: Both Weakness of Arms/Hands: Both  Permission Sought/Granted Permission sought to share information with : Case Manager, Family Supports, Investment banker, corporate granted to share info w AGENCY: SNF        Emotional Assessment Appearance:: Appears stated age Attitude/Demeanor/Rapport: Gracious Affect (typically observed): Calm Orientation: : Oriented to  Time, Oriented to Place, Oriented to Self Alcohol / Substance Use: Not Applicable Psych Involvement: No (comment)  Admission diagnosis:  Shortness of breath [R06.02] Hyperkalemia [E87.5] Elevated liver enzymes [R74.8] Atrial fibrillation with RVR ([place]) [I48.91] Patient Active Problem List  Diagnosis Date Noted  . Bacteremia due to Enterococcus 03/11/2020  . Atrial fibrillation with RVR ([place]) 03/10/2020  . Macrocytic anemia 03/10/2020  . Thrombocytopenia ([place]) 03/10/2020  .  Hyperkalemia 03/10/2020  . Septic shock ([place]) 03/10/2020  . Elevated liver enzymes 03/10/2020  . Respiratory failure with hypoxia ([place]) 01/24/2020  . Hypoxia 01/23/2020  . Acute on chronic respiratory failure with hypoxia ([place]) 01/23/2020  . Persistent atrial fibrillation ([place])   . Cellulitis and abscess of right leg 11/23/2019  . ESRD (end stage renal disease) ([place]) 11/23/2019  . Elevated troponin   . Atrial fibrillation with rapid ventricular response ([place]) 11/22/2019  . Acute pulmonary edema (HCC)   . Acute respiratory failure with hypoxia and hypercapnia ([place]) 09/10/2019  . SVT (supraventricular tachycardia) ([place]) 09/10/2019  . On mechanically assisted ventilation ([place]) 09/08/2019  . Acute metabolic encephalopathy 66/01/3015  . Volume overload 09/06/2019  . AKI (acute kidney injury) ([place]) 09/06/2019  . Altered mental state 11/28/2018  . Near syncope 11/28/2018  . Fall at home 11/28/2018  . History of seizure 11/28/2018  . 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 Pressure injury of skin 09/17/2018  . Respiratory failure ([place]) 08/16/2018  . Seizure ([place]) 08/16/2018  . Acute encephalopathy 07/27/2018  . Endotracheally intubated 07/27/2018  . Hypothyroidism 07/27/2018  . Pleuritic chest pain 07/14/2018  . Moderate persistent asthma without complication   . Type 2 diabetes mellitus ([place]) 04/19/2018  . Acute kidney injury superimposed on chronic kidney disease ([place]) 01/13/2018  . Altered mental status   . Encephalopathy 10/11/2016  . Chest pain 08/12/2016  . CKD (chronic kidney disease), stage IV ([place]) 08/12/2016  . History of pulmonary embolus (PE) 08/12/2016  . Obesity, Class III, BMI 40-49.9 (morbid obesity) ([place]) 08/12/2016  . RBBB 08/12/2016  . Positive D dimer 08/12/2016  . Cerebral infarction ([place]) 11/10/2015  . Physical deconditioning 07/01/2011  . Weakness 07/01/2011  . Diabetic foot ulcer ([place]) 05/19/2011  . Polypharmacy 02/09/2011  . BACK PAIN WITH  RADICULOPATHY 05/25/2010  . Type 2 diabetes mellitus with diabetic polyneuropathy, without long-term current use of insulin ([place]) 08/18/2009  . Acute on chronic diastolic CHF (congestive heart failure) ([place]) 04/25/2009  . Normocytic anemia 04/14/2009  . Sleep apnea 12/11/2008  . Diabetic peripheral neuropathy ([place]) 05/04/2007  . HYPERCHOLESTEROLEMIA 10/13/2006  . Gout, unspecified 10/13/2006  . HYPOKALEMIA 10/13/2006  . Schizophrenia ([place]) 10/13/2006  . Depression 10/13/2006  . Essential hypertension 10/13/2006  . Venous (peripheral) insufficiency 10/13/2006  . RHINITIS, ALLERGIC 10/13/2006  . Asthma 10/13/2006  . Reflux esophagitis 10/13/2006  . OSTEOARTHRITIS, MULTI SITES 10/13/2006  . INCONTINENCE, URGE 10/13/2006   PCP:  Glenda Chroman, MD Pharmacy:   [facility] [address] [place] [address] Phone: [phone] Fax: [place], [place] - [place] [address] Phone: [phone] Fax: [phone]  [place], [place] [address] Phone: [phone] Fax: [place], [place] - [address] [place] [address] Phone: [phone] Fax: [phone]     Social Determinants of Health (SDOH) Interventions    Readmission Risk Interventions Readmission Risk Prevention Plan 09/10/2019 11/29/2018  Transportation Screening Complete Complete  Medication Review (RN Care Manager) Complete Complete  PCP or Specialist appointment within 3-5 days of discharge - Not Complete  PCP/Specialist Appt Not Complete comments -  earliest appt available was 12/08/18  HRI or Home Care Consult - Complete  SW Recovery Care/Counseling Consult - Complete  Palliative Care Screening - Not Applicable  Skilled Nursing Facility - Not Applicable  Some recent data might be  hidden

## 2020-03-11 NOTE — Progress Notes (Signed)
PHARMACY - PHYSICIAN COMMUNICATION CRITICAL VALUE ALERT - BLOOD CULTURE IDENTIFICATION (BCID)  [name] is an 69 y.o. female who presented to [facility] on 03/10/2020 with a chief complaint of SOB.  Assessment:  Started on broad-spectrum ABX for sepsis d/t PNA; now 4 of 4 blood cx bottles are growing enterococcus spp; of note pt has a recent h/o VRE UTI but BCID does NOT detect resistance.  Name of physician (or Provider) Contacted: SSommer  Current antibiotics: vancomycin and cefepime  Changes to prescribed antibiotics recommended:  Will continue ABX and await further input from ID in am.  Results for orders placed or performed during the hospital encounter of 03/10/20  Blood Culture ID Panel (Reflexed) (Collected: 03/10/2020 11:12 AM)  Result Value Ref Range   Enterococcus species DETECTED (A) NOT DETECTED   Vancomycin resistance NOT DETECTED NOT DETECTED   Listeria monocytogenes NOT DETECTED NOT DETECTED   Staphylococcus species NOT DETECTED NOT DETECTED   Staphylococcus aureus (BCID) NOT DETECTED NOT DETECTED   Streptococcus species NOT DETECTED NOT DETECTED   Streptococcus agalactiae NOT DETECTED NOT DETECTED   Streptococcus pneumoniae NOT DETECTED NOT DETECTED   Streptococcus pyogenes NOT DETECTED NOT DETECTED   Acinetobacter baumannii NOT DETECTED NOT DETECTED   Enterobacteriaceae species NOT DETECTED NOT DETECTED   Enterobacter cloacae complex NOT DETECTED NOT DETECTED   Escherichia coli NOT DETECTED NOT DETECTED   Klebsiella oxytoca NOT DETECTED NOT DETECTED   Klebsiella pneumoniae NOT DETECTED NOT DETECTED   Proteus species NOT DETECTED NOT DETECTED   Serratia marcescens NOT DETECTED NOT DETECTED   Haemophilus influenzae NOT DETECTED NOT DETECTED   Neisseria meningitidis NOT DETECTED NOT DETECTED   Pseudomonas aeruginosa NOT DETECTED NOT DETECTED   Candida albicans NOT DETECTED NOT DETECTED   Candida glabrata NOT DETECTED NOT DETECTED   Candida krusei NOT  DETECTED NOT DETECTED   Candida parapsilosis NOT DETECTED NOT DETECTED   Candida tropicalis NOT DETECTED NOT DETECTED    Wynona Neat, PharmD, BCPS  03/11/2020  1:14 AM

## 2020-03-11 NOTE — Plan of Care (Signed)
  Problem: Clinical Measurements: Goal: Diagnostic test results will improve Outcome: Progressing Goal: Respiratory complications will improve Outcome: Progressing; able to tolerate O2 via [place] Goal: Cardiovascular complication will be avoided Outcome: Progressing; HR more controlled in the 110-120s   Problem: Activity: Goal: Risk for activity intolerance will decrease Outcome: Progressing; able to participate with PT/OT   Problem: Nutrition: Goal: Adequate nutrition will be maintained Outcome: Progressing; tolerating renal diet   Problem: Coping: Goal: Level of anxiety will decrease Outcome: Progressing   Problem: Elimination: Goal: Will not experience complications related to urinary retention Outcome: Not Applicable   Problem: Pain Managment: Goal: General experience of comfort will improve Outcome: Progressing; no complaints of pain   Problem: Safety: Goal: Ability to remain free from injury will improve Outcome: Progressing   Problem: Skin Integrity: Goal: Risk for impaired skin integrity will decrease Outcome: Progressing

## 2020-03-11 NOTE — Progress Notes (Signed)
[name] for heparin Indication: atrial fibrillation  Allergies  Allergen Reactions  . Benztropine Mesylate Other (See Comments)    Alopecia and white spots on eyes  . Codeine Swelling, Palpitations and Other (See Comments)    Mouth Swelling and Tachycardia   . Diazepam Other (See Comments)    COMA  . Diphenhydramine Hcl Anxiety, Palpitations and Other (See Comments)    Tachycardia and anxiousness  . Penicillins Swelling  . Sulfonamide Derivatives Rash    Blisters, also  . Thioridazine Hcl Swelling  . Bee Venom     Documented on MAR  . Lisinopril Cough    Patient Measurements: Weight: 87.6 kg (193 lb 2 oz)  Vital Signs: Temp: 97.3 F (36.3 C) (07/27 1130) Temp Source: Axillary (07/27 1130) BP: 134/64 (07/27 1400) Pulse Rate: 28 (07/27 1400)  Labs: Recent Labs    03/10/20 1032 03/10/20 1032 03/10/20 1048 03/10/20 1048 03/10/20 1050 03/10/20 1112 03/10/20 1323 03/11/20 0335 03/11/20 1137  HGB 11.2*   < > 12.2   < > 11.9*  --   --  11.2*  --   HCT 35.9*   < > 36.0  --  35.0*  --   --  35.4*  --   PLT 138*  --   --   --   --   --   --  171  --   APTT  --   --   --   --   --  33  --  98* 107*  LABPROT  --   --   --   --   --  23.0*  --  24.3*  --   INR  --   --   --   --   --  2.1*  --  2.3*  --   HEPARINUNFRC  --   --   --   --   --   --   --  >2.20*  --   CREATININE 4.40*  --  4.20*  --   --   --   --  2.61*  --   TROPONINIHS 42*  --   --   --   --   --  56*  --   --    < > = values in this interval not displayed.    Estimated Creatinine Clearance: 21.2 mL/min (A) (by C-G formula based on SCr of 2.61 mg/dL (H)).  Assessment: 69 yo f presenting with Afib RVR - on apixaban PTA  Apixaban held per cards. Heparin level >2 as expected with recent apixaban. Aptt trending up 98>107s. Bleeding/oozing around CVL this am, may need to hold anticoagulation if continues to bleed.   Goal of Therapy:  Heparin level 0.3-0.7  units/ml aPTT 66 - 102 seconds Monitor platelets by anticoagulation protocol: Yes   Plan:  Decrease Heparin to 800 units/hr Daily heparin level and aptt  Erin Hearing PharmD., BCPS Clinical Pharmacist 03/11/2020 2:42 PM   Please check AMION for all [place] numbers

## 2020-03-11 NOTE — Progress Notes (Signed)
Initial Nutrition Assessment  DOCUMENTATION CODES:   Not applicable  INTERVENTION:   Ensure Enlive po BID, each supplement provides 350 kcal and 20 grams of protein  Magic cup BID with meals, each supplement provides 290 kcal and 9 grams of protein  Add Rena-Vite daily  Phosphorus currently 3.2; if continues to trend down while on CRRT, considering reducing/holding PhosLo   Consider liberalizing diet to Regular   NUTRITION DIAGNOSIS:   Increased nutrient needs related to acute illness as evidenced by estimated needs.  GOAL:   Patient will meet greater than or equal to 90% of their needs  MONITOR:   PO intake, Supplement acceptance, Labs, Weight trends, Skin  REASON FOR ASSESSMENT:   Rounds    ASSESSMENT:   69 yo female admitted with septic shock secondary to enterococcal bacteremia, ESRD requiring CRRT. PMH includes ESRD on HD, DM, CHF, COPD, seizure disorder  Off BIpap this AM, remains on CRRT Evaluated by SLP today and liquids downgraded to Nectar Thick  Pt reports good appetite at present. Pt ate 75% at breakfast this AM. Pt reports she does not eat well prior to admission due to the fact that the food is "horrible" at [facility]. She tries to eat 3 meals per day but only when the food is good. She does not take any protein supplements  Pt unsure if she has had any weight changes recently; pt does not know her EDW. Per chart review, EDW 85.5 kg. Current weight 87.6 kg. +mild/moderte edema on exam.   Labs: CBGs 56-270, potassium 3.8 (wdl), phosphorus 3.2 (Considered low for HD) Meds: calcitriol, PhosLo, ss novolog   NUTRITION - FOCUSED PHYSICAL EXAM:    Most Recent Value  Orbital Region No depletion  Upper Arm Region No depletion  Thoracic and Lumbar Region No depletion  Buccal Region No depletion  Temple Region No depletion  Clavicle Bone Region No depletion  Clavicle and Acromion Bone Region No depletion  Scapular Bone Region No depletion  Dorsal  Hand Unable to assess  Patellar Region Unable to assess  Anterior Thigh Region Unable to assess  Posterior Calf Region Unable to assess  Edema (RD Assessment) Moderate       Diet Order:   Diet Order            Diet renal with fluid restriction Fluid restriction: 1200 mL Fluid; Room service appropriate? Yes; Fluid consistency: Nectar Thick  Diet effective now                 EDUCATION NEEDS:   Not appropriate for education at this time  Skin:  Skin Assessment: Skin Integrity Issues: Skin Integrity Issues:: Unstageable Unstageable: L. Heel  Last BM:  7/23  Height:   Ht Readings from Last 1 Encounters:  03/06/20 5\' 2"  (1.575 m)    Weight:   Wt Readings from Last 1 Encounters:  03/11/20 87.6 kg    BMI:  Body mass index is 35.32 kg/m.  Estimated Nutritional Needs:   Kcal:  2000-2200 kcals  Protein:  100-115 g  Fluid:  1000 mL plus UOP   Kerman Passey MS, RDN, LDN, CNSC Registered Dietitian III Clinical Nutrition RD Pager and On-Call Pager Number Located in [place]

## 2020-03-11 NOTE — NC FL2 (Signed)
[place] LEVEL OF CARE SCREENING TOOL     IDENTIFICATION  Patient Name: [name] Birthdate: 22-Aug-1950 Sex: female Admission Date (Current Location): 03/10/2020  [facility] and [place] Number:  Herbalist and Address:  [facility], [place] [address], [place], [place] [address]      Provider Number: [id number]  Attending Physician Name and Address:  Rigoberto Noel, MD  Relative Name and Phone Number:       Current Level of Care: Hospital Recommended Level of Care: [place] Prior Approval Number:    Date Approved/Denied:   PASRR Number: [id number]  Discharge Plan: SNF    Current Diagnoses: Patient Active Problem List   Diagnosis Date Noted  . Bacteremia due to Enterococcus 03/11/2020  . Atrial fibrillation with RVR ([place]) 03/10/2020  . Macrocytic anemia 03/10/2020  . Thrombocytopenia ([place]) 03/10/2020  . Hyperkalemia 03/10/2020  . Septic shock ([place]) 03/10/2020  . Elevated liver enzymes 03/10/2020  . Respiratory failure with hypoxia ([place]) 01/24/2020  . Hypoxia 01/23/2020  . Acute on chronic respiratory failure with hypoxia ([place]) 01/23/2020  . Persistent atrial fibrillation ([place])   . Cellulitis and abscess of right leg 11/23/2019  . ESRD (end stage renal disease) ([place]) 11/23/2019  . Elevated troponin   . Atrial fibrillation with rapid ventricular response ([place]) 11/22/2019  . Acute pulmonary edema (HCC)   . Acute respiratory failure with hypoxia and hypercapnia ([place]) 09/10/2019  . SVT (supraventricular tachycardia) ([place]) 09/10/2019  . On mechanically assisted ventilation ([place]) 09/08/2019  . Acute metabolic encephalopathy 64/40/3474  . Volume overload 09/06/2019  . AKI (acute kidney injury) ([place]) 09/06/2019  . Altered mental state 11/28/2018  . Near syncope 11/28/2018  . Fall at home 11/28/2018  . History of seizure 11/28/2018  . Secondary Parkinson disease ([place]) 10/19/2018  . Pressure injury of  skin 09/17/2018  . Respiratory failure ([place]) 08/16/2018  . Seizure ([place]) 08/16/2018  . Acute encephalopathy 07/27/2018  . Endotracheally intubated 07/27/2018  . Hypothyroidism 07/27/2018  . Pleuritic chest pain 07/14/2018  . Moderate persistent asthma without complication   . Type 2 diabetes mellitus ([place]) 04/19/2018  . Acute kidney injury superimposed on chronic kidney disease ([place]) 01/13/2018  . Altered mental status   . Encephalopathy 10/11/2016  . Chest pain 08/12/2016  . CKD (chronic kidney disease), stage IV ([place]) 08/12/2016  . History of pulmonary embolus (PE) 08/12/2016  . Obesity, Class III, BMI 40-49.9 (morbid obesity) ([place]) 08/12/2016  . RBBB 08/12/2016  . Positive D dimer 08/12/2016  . Cerebral infarction ([place]) 11/10/2015  . Physical deconditioning 07/01/2011  . Weakness 07/01/2011  . Diabetic foot ulcer ([place]) 05/19/2011  . Polypharmacy 02/09/2011  . BACK PAIN WITH RADICULOPATHY 05/25/2010  . Type 2 diabetes mellitus with diabetic polyneuropathy, without long-term current use of insulin ([place]) 08/18/2009  . Acute on chronic diastolic CHF (congestive heart failure) ([place]) 04/25/2009  . Normocytic anemia 04/14/2009  . Sleep apnea 12/11/2008  . Diabetic peripheral neuropathy ([place]) 05/04/2007  . HYPERCHOLESTEROLEMIA 10/13/2006  . Gout, unspecified 10/13/2006  . HYPOKALEMIA 10/13/2006  . Schizophrenia ([place]) 10/13/2006  . Depression 10/13/2006  . Essential hypertension 10/13/2006  . Venous (peripheral) insufficiency 10/13/2006  . RHINITIS, ALLERGIC 10/13/2006  . Asthma 10/13/2006  . Reflux esophagitis 10/13/2006  . OSTEOARTHRITIS, MULTI SITES 10/13/2006  . INCONTINENCE, URGE 10/13/2006    Orientation RESPIRATION BLADDER Height & Weight     Self, Time, Place  O2 (Nasal Cannula 2 liters) Continent Weight: 193 lb 2  oz (87.6 kg) Height:     BEHAVIORAL SYMPTOMS/MOOD NEUROLOGICAL BOWEL NUTRITION STATUS      Continent Diet (See Discharge Summary)  AMBULATORY STATUS  COMMUNICATION OF NEEDS Skin   Extensive Assist Verbally Surgical wounds, Other (Comment), PU Stage and Appropriate Care (Unstageable PU L Heel; unstageable PU R heel;Closed incision R arm; Ecchymosis generalized;MASD groin/abdomen;Cracking on heels)       PU Stage 4 Dressing:  (Unstageable PU Billateral with foam to be changed every 3 days)               Personal Care Assistance Level of Assistance  Bathing, Feeding, Dressing Bathing Assistance: Maximum assistance Feeding assistance: Independent (able to feed self;Fluid restriction renal) Dressing Assistance: Maximum assistance     Functional Limitations Info  Sight, Hearing, Speech Sight Info: Adequate Hearing Info: Adequate Speech Info: Adequate    SPECIAL CARE FACTORS FREQUENCY  PT (By licensed PT), OT (By licensed OT)     PT Frequency: 5x min weekly OT Frequency: 5x min weekly            Contractures Contractures Info: Not present    Additional Factors Info  Code Status, Allergies, Insulin Sliding Scale Code Status Info: FULL Allergies Info: Benztropine Mesylate,Codeine,Diazepam,Diphenhydramine Hcl,Penicillins,Sulfonamide Derivatives,Thioridazine Hcl,Bee Venom,Lisinopril,   Insulin Sliding Scale Info: insulin aspart (novoLOG) injection 0-15 Units 3 times daily with meals,insulin aspart (novoLOG) injection 0-5 Units Daily at bedtime       Current Medications (03/11/2020):  This is the current hospital active medication list Current Facility-Administered Medications  Medication Dose Route Frequency Provider Last Rate Last Admin  .  prismasol BGK 4/2.5 infusion   CRRT Continuous Claudia Desanctis, MD 400 mL/hr at 03/11/20 0804 New Bag at 03/11/20 0804  .  prismasol BGK 4/2.5 infusion   CRRT Continuous Claudia Desanctis, MD 300 mL/hr at 03/11/20 1203 New Bag at 03/11/20 1203  . 0.9 %  sodium chloride infusion  250 mL Intravenous Continuous Rigoberto Noel, MD      . acetaminophen (TYLENOL) tablet 650 mg  650 mg Oral Q4H PRN  Pahwani, Rinka R, MD      . albuterol (PROVENTIL) (2.5 MG/3ML) 0.083% nebulizer solution 2.3333 mg  2.3333 mg Inhalation Q6H PRN Pahwani, Rinka R, MD      . amiodarone (NEXTERONE PREMIX) 360-4.14 MG/200ML-% (1.8 mg/mL) IV infusion  30 mg/hr Intravenous Continuous Kathyrn Drown D, NP 16.67 mL/hr at 03/11/20 1500 30 mg/hr at 03/11/20 1500  . ARIPiprazole (ABILIFY) tablet 2 mg  2 mg Oral Daily Pahwani, Rinka R, MD   2 mg at 03/11/20 0922  . aspirin EC tablet 81 mg  81 mg Oral Q breakfast Pahwani, Rinka R, MD   81 mg at 03/11/20 6269  . atorvastatin (LIPITOR) tablet 20 mg  20 mg Oral QHS Pahwani, Rinka R, MD      . calcitRIOL (ROCALTROL) capsule 0.25 mcg  0.25 mcg Oral Q M,W,F Pahwani, Rinka R, MD      . calcium acetate (PHOSLO) capsule 2,001 mg  2,001 mg Oral TID WC Pahwani, Rinka R, MD   2,001 mg at 03/11/20 1119  . calcium carbonate (TUMS - dosed in mg elemental calcium) chewable tablet 200 mg of elemental calcium  1 tablet Oral Q6H PRN Pahwani, Rinka R, MD      . Chlorhexidine Gluconate Cloth 2 % PADS 6 each  6 each Topical Daily Pahwani, Rinka R, MD   6 each at 03/11/20 1000  . docusate sodium (COLACE) capsule 100 mg  100 mg Oral Daily PRN Rigoberto Noel, MD   100 mg at 03/11/20 1510  . feeding supplement (ENSURE ENLIVE) (ENSURE ENLIVE) liquid 237 mL  237 mL Oral BID BM Kara Mead V, MD   237 mL at 03/11/20 1510  . heparin ADULT infusion 100 units/mL (25000 units/228mL sodium chloride 0.45%)  800 Units/hr Intravenous Continuous Lyndee Leo, RPH 8 mL/hr at 03/11/20 1500 800 Units/hr at 03/11/20 1500  . heparin injection 1,000-6,000 Units  1,000-6,000 Units CRRT PRN Claudia Desanctis, MD      . insulin aspart (novoLOG) injection 0-15 Units  0-15 Units Subcutaneous TID WC Pahwani, Rinka R, MD   2 Units at 03/11/20 1122  . insulin aspart (novoLOG) injection 0-5 Units  0-5 Units Subcutaneous QHS Pahwani, Rinka R, MD   2 Units at 03/10/20 2133  . levETIRAcetam (KEPPRA) tablet 250 mg  250 mg Oral BID  Pahwani, Rinka R, MD   250 mg at 03/11/20 0811  . levothyroxine (SYNTHROID) tablet 50 mcg  50 mcg Oral QAC breakfast Pahwani, Rinka R, MD      . linezolid (ZYVOX) tablet 600 mg  600 mg Oral Q12H Tommy Medal, Lavell Islam, MD   600 mg at 03/11/20 1003  . MEDLINE mouth rinse  15 mL Mouth Rinse BID Rigoberto Noel, MD   15 mL at 03/11/20 1100  . montelukast (SINGULAIR) tablet 10 mg  10 mg Oral QHS Pahwani, Rinka R, MD      . multivitamin (RENA-VIT) tablet 1 tablet  1 tablet Oral QHS Kara Mead V, MD      . nitroGLYCERIN (NITROSTAT) SL tablet 0.4 mg  0.4 mg Sublingual Q5 Min x 3 PRN Pahwani, Rinka R, MD      . ondansetron (ZOFRAN) injection 4 mg  4 mg Intravenous Q6H PRN Pahwani, Rinka R, MD      . pantoprazole (PROTONIX) EC tablet 40 mg  40 mg Oral Daily Pahwani, Rinka R, MD   40 mg at 03/11/20 0922  . phenylephrine CONCENTRATED 100mg  in sodium chloride 0.9% 246mL (0.4mg /mL) infusion  0-400 mcg/min Intravenous Titrated Kara Mead V, MD 18.75 mL/hr at 03/11/20 1500 125 mcg/min at 03/11/20 1500  . predniSONE (DELTASONE) tablet 10 mg  10 mg Oral Daily Pahwani, Rinka R, MD   10 mg at 03/11/20 0811  . primidone (MYSOLINE) tablet 50 mg  50 mg Oral Daily Rigoberto Noel, MD   50 mg at 03/11/20 1003  . prismasol BGK 4/2.5 infusion   CRRT Continuous Rexene Agent, MD 1,500 mL/hr at 03/11/20 1457 New Bag at 03/11/20 1457  . sodium chloride flush (NS) 0.9 % injection 10-40 mL  10-40 mL Intracatheter Q12H Desai, Rahul P, PA-C   10 mL at 03/11/20 0923  . sodium chloride flush (NS) 0.9 % injection 10-40 mL  10-40 mL Intracatheter PRN [name], PA-C         Discharge Medications: Please see discharge summary for a list of discharge medications.  Relevant Imaging Results:  Relevant Lab Results:   Additional Information [phone]  Trula Ore, LCSWA

## 2020-03-11 NOTE — Progress Notes (Signed)
NAME:  [name], MRN:  [id number], DOB:  1950/11/22, LOS: 1 ADMISSION DATE:  03/10/2020, CONSULTATION DATE:  7/26 REFERRING MD:  pahwani, CHIEF COMPLAINT:  afib RVR   Brief History   49 yof admitted to [facility] on 03/10/20 for afib with RVR, decompensated heart failure and septic shock. She developed progressive hypotension and PCCM was consulted for admission to ICU for pressor support.  History of present illness   89 yof with numerous comorbidities . Presented from SNF for atrial fibrillation with HR in the 170s.  Started on diltiazem drip by EMS however upon arrival to the ED, HR remained elevated and diltiazem drip was increased. HR improved with increased rate however she developed progressive hypotension and was transitioned over to amiodarone. Due to continued hypotension, PCCM was consulted for transfer to ICU for pressor support. Additionally, developed respiratory failure. Workup in the ED revealed an elevated white count and CXR concerning for pna.    Past Medical History  HFpEF, persistent atrial fibrillation, T2DM, RA, hypothyroidism, COPD, ESRD on HD, seizure disorder  Significant Hospital Events   7/26 admission 7/26 transferred to ICU, CRRT initiated, on Neo-Synephrine  Consults:  PCCM Cardiology Nephrology  Procedures:  RT [facility] CVL 7/26 >> Significant Diagnostic Tests:  7/26 CXR>> poor inspiratory effort. Right pleural effusion. Fluid appreciable in fissures.  Micro Data:  7/26 blood cultures>> enterococcus >>  Antimicrobials:  Vancomycin 7/26>> Cefepime 7/26>>  Interim history/subjective:   Appears much better. Taken off BiPAP this morning Remains on Neo-Synephrine, central line placed overnight On CRRT  Objective   Blood pressure (!) 100/57, pulse (!) 125, temperature 97.7 F (36.5 C), temperature source Axillary, resp. rate 19, weight 87.6 kg, SpO2 100 %. CVP:  [21 mmHg-22 mmHg] 22 mmHg  Vent Mode: PCV;BIPAP FiO2 (%):  [40 %] 40 % Set Rate:  [14  bmp] 14 bmp PEEP:  [6 cmH20] 6 cmH20 Pressure Support:  [8 cmH20] 8 cmH20   Intake/Output Summary (Last 24 hours) at 03/11/2020 0840 Last data filed at 03/11/2020 0800 Gross per 24 hour  Intake 4080.24 ml  Output 4 ml  Net 4076.24 ml   Filed Weights   03/10/20 1800 03/11/20 0500  Weight: 85.5 kg 87.6 kg    Examination: General: acutely ill appearing woman, no distress HENT: Mild pallor, no icterus Lungs: No accessory muscle use, decreased breath sounds on right Cardiovascular: S1-S2 tacky, regular extremities cold to touch. +2 edema in the lower extremities.  Central line site-oozing blood Abdomen: soft.,  Suprapubic tenderness Extremities: Cool no edema Neuro: Alert, interactive, nonfocal Skin: no rash  Labs show decreasing creatinine, potassium normalized, decreasing procalcitonin, leukocytosis persists  Resolved Hospital Problem list   n/a  Assessment & Plan:   Septic shock due to Enterococcus Source?  Urine?  Line Plan -continue cefepime and vanc -follow blood cultures for sensitivity, does not appear to be VRE, obtain ID consult. -We will likely need to discontinue permacath -May need TEE to rule out vegetation -Neo-Synephrine drip being titrated  .  HFpEF. Echo from April indicating EF 65-70% with moderate PA hypertension. Severe biatrial dilation.  Mild-mod aortic stenosis. BNP 1500 on admission Persistent Atrial Fibrillation with RVR. On eliquis at home. HR in the 170s on presentation.  Drop blood pressure with Cardizem, better controlled on amiodarone Bifasicular block Tropinemia likely attributable to type II NSTEMI Plan:  -continue amiodarone drip -heparin IV for anticoagulation instead of Eliquis, may have to hold if CVL continues to use -tele   Acute hypoxic respiratory  failure 2/2 pulm edema vs pneumonia with chronic right pleural effusion. Currently only on 3L supplemental oxygen but with abdominal breathing Chronic right pleural effusion.  Previously noted on imaging so do not suspect this to be the source of respiratory failure and is likely 2/2 heart failure /ESRD. COPD Plan: Tolerating off BiPAP Consider thoracentesis only if no response to antibiotics   Acute toxic/metabolic encephalopathy.  Plan: Improved as blood pressure improved  ESRD on HD. -Continue CRRT per renal -May have to consider removal of permacath  Transaminitis. AST 432; ALT 330. Suspect to be attributable to liver shock with hypotension/heart failure.  Plan:  RUQ [place] >> no evidence of obstructive jaundice Continue to montitor. Avoid hepatotoxic drugs.  T2DM. Hyperglycemic on admission.  Now hypoglycemic while n.p.o. continue SSI and adjust accordingly to CBGs  Hypothyroidisms. Continue synthroid  Rheumatoid arthritis: continue prednisone 10mg  daily.  Seizure disorder: continue keppra  Best practice:  Diet: NPO Pain/Anxiety/Delirium protocol (if indicated): n/a VAP protocol (if indicated): n/a DVT prophylaxis: heparin IV GI prophylaxis: n/a Glucose control: SSI Mobility: BR Code Status: Full Family Communication: Patient, she designates her niece as [facility] POA Disposition: ICU  The patient is critically ill with multiple organ systems failure and requires high complexity decision making for assessment and support, frequent evaluation and titration of therapies, application of advanced monitoring technologies and extensive interpretation of multiple databases. Critical Care Time devoted to patient care services described in this note independent of APP/resident  time is 35 minutes.   Kara Mead MD. [place]ade Flood. [place] [facility]  If no response to pager , please call [phone]     03/11/20  8:40 AM

## 2020-03-11 NOTE — Progress Notes (Signed)
[place] Progress Note Patient Name: [name] DOB: Jul 20, 1951 MRN: [id number]   Date of Service  03/11/2020  HPI/Events of Note  Bleeding from CVL insertion site.   eICU Interventions  Place thrombi-pad to CVL insertion site bleeding.      Intervention Category Major Interventions: Other:  Lysle Dingwall 03/11/2020, 3:38 AM

## 2020-03-11 NOTE — Consult Note (Signed)
Neurology Consultation  Reason for Consult: Altered mental status, not talking Referring Physician: Dr. Genevive Bi, pulmonary critical care  CC: Sudden onset of altered mental status and nonverbal  History is obtained from: Patient's RN, chart  HPI: [name] is a 68 y.o. female past medical history of catatonia, schizoaffective disorder, atrial flutter, currently on heparin drip, epilepsy-on Keppra, fibromyalgia, depression, hyperlipidemia, hypothyroidism, pulmonary embolism in 2010, PTSD, sleep apnea, hypertension, ESRD on hemodialysis, currently being treated in the hospital for septic shock due to Enterococcus-question urosepsis, on cefepime and vancomycin, was in usual state of health this evening and had a sudden onset of an episode where she was not talking with her eyes open and appeared altered. Her last known normal according to her RN was somewhere around 7 PM and when she went around again on her the patient appeared altered and not talking. Patient is unable to provide any history. She was brought in for a stat head CT after I spoke with the nurse over the phone. On my initial examination-see my detailed exam below. After the CT scan, she started talking again-see details below. Patient reports she has had epilepsy since the age of 69 years old. She reports compliance to her medications. Could not tell me exactly what her seizures have been recently and how frequently has she been having them Neurological chart review showed a telemedicine neurology consultation from January 5397 for metabolic encephalopathy as well as telemedicine note from [name] which reports that she has had episodes of catatonia because of her underlying schizoaffective disorder and question of seizures since the age of 70 years old. She has had Keppra and Lamictal use with intermittent discontinuation and unclear reasons as to why changes were made.  Soon after the CAT scan was completed, she was  evaluated again and started talking. She was able to tell me her name, she was able to tell me she has had epilepsy since the age of 69 years old. She was able to move all 4 extremities.  LKW: 7 PM today tpa given?: no, nonfocal exam, sudden improvement-more likely psychogenic etiology Premorbid modified Rankin scale (mRS): Patient unable to reliably provide. Possibly a 2  ROS: Patient unable to provide due to altered mental this  Past Medical History:  Diagnosis Date  . Anemia   . Angina pectoris ([place])   . Anxiety   . Asthma   . Atrial flutter ([place])   . Back pain   . Barrett esophagus   . Bifascicular block   . BMI 39.0-39.9,adult   . Cellulitis   . CHF (congestive heart failure) ([place])   . Chronic diastolic CHF (congestive heart failure) ([place])   . Constipation   . Contact with and (suspected) exposure to covid-19   . COPD (chronic obstructive pulmonary disease) ([place])   . Dependence on renal dialysis ([place])   . Depression   . Dysphagia   . Dysuria   . Epilepsy ([place])   . ESRD on hemodialysis ([place])   . Essential hypertension   . Fibromyalgia   . Fibromyalgia   . GERD (gastroesophageal reflux disease)   . Gout   . History of pericarditis   . Hypercholesterolemia   . Hyperlipidemia   . Hypothyroid   . Hypothyroidism   . Insomnia   . Long term (current) use of insulin ([place])   . Major depressive disorder   . Memory loss   . Moderate protein-calorie malnutrition ([place])   . Morbid obesity ([place])   . Nephrolithiasis  2008  . Obesity hypoventilation syndrome (HCC)    CPAP  . Obstructive sleep apnea   . Pain in left arm   . PE (pulmonary embolism)    2010  . Peripheral neuropathy   . Persistent atrial fibrillation ([place])   . Pneumonia   . Polyp of nasal sinus   . PTSD (post-traumatic stress disorder)   . Pulmonary hypertension ([place])   . Rheumatoid arteritis ([place])   . Rheumatoid arthritis ([place])   . Schizophrenia ([place])   . 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  Seizures ([place])   . Steroid dependence ([place])   . Tachycardia, unspecified   . Type 2 diabetes mellitus ([place])   . Unspecified intellectual disabilities   . Unspecified psychosis not due to a substance or known physiological condition ([place])   . Urinary tract infection   . Vitamin D deficiency     Family History  Problem Relation Age of Onset  . Bone cancer Mother   . Hypertension Mother   . Heart disease Mother   . COPD Father   . Heart disease Father   . Stroke Father     Social History:   reports that she quit smoking about 47 years ago. Her smoking use included cigarettes. She has never used smokeless tobacco. She reports that she does not drink alcohol and does not use drugs.  Medications  Current Facility-Administered Medications:  .   prismasol BGK 4/2.5 infusion, , CRRT, Continuous, Claudia Desanctis, MD, Last Rate: 400 mL/hr at 03/11/20 2016, New Bag at 03/11/20 2016 .   prismasol BGK 4/2.5 infusion, , CRRT, Continuous, Claudia Desanctis, MD, Last Rate: 300 mL/hr at 03/11/20 1203, New Bag at 03/11/20 1203 .  0.9 %  sodium chloride infusion, 250 mL, Intravenous, Continuous, Kara Mead V, MD .  acetaminophen (TYLENOL) tablet 650 mg, 650 mg, Oral, Q4H PRN, Pahwani, Rinka R, MD .  albuterol (PROVENTIL) (2.5 MG/3ML) 0.083% nebulizer solution 2.3333 mg, 2.3333 mg, Inhalation, Q6H PRN, Pahwani, Rinka R, MD .  [COMPLETED] amiodarone (NEXTERONE) 1.8 mg/mL load via infusion 150 mg, 150 mg, Intravenous, Once, 150 mg at 03/10/20 1644 **FOLLOWED BY** [EXPIRED] amiodarone (NEXTERONE PREMIX) 360-4.14 MG/200ML-% (1.8 mg/mL) IV infusion, 60 mg/hr, Intravenous, Continuous, Last Rate: 16.67 mL/hr at 03/11/20 0300, 30 mg/hr at 03/11/20 0300 **FOLLOWED BY** amiodarone (NEXTERONE PREMIX) 360-4.14 MG/200ML-% (1.8 mg/mL) IV infusion, 30 mg/hr, Intravenous, Continuous, Kathyrn Drown D, NP, Last Rate: 16.67 mL/hr at 03/11/20 2100, 30 mg/hr at 03/11/20 2100 .  ARIPiprazole (ABILIFY) tablet 2 mg, 2 mg, Oral,  Daily, Pahwani, Rinka R, MD, 2 mg at 03/11/20 0922 .  aspirin EC tablet 81 mg, 81 mg, Oral, Q breakfast, Pahwani, Rinka R, MD, 81 mg at 03/11/20 0812 .  atorvastatin (LIPITOR) tablet 20 mg, 20 mg, Oral, QHS, Pahwani, Rinka R, MD, 20 mg at 03/11/20 2302 .  calcitRIOL (ROCALTROL) capsule 0.25 mcg, 0.25 mcg, Oral, Q M,W,F, Pahwani, Rinka R, MD .  calcium acetate (PHOSLO) capsule 2,001 mg, 2,001 mg, Oral, TID WC, Pahwani, Rinka R, MD, 2,001 mg at 03/11/20 1602 .  calcium carbonate (TUMS - dosed in mg elemental calcium) chewable tablet 200 mg of elemental calcium, 1 tablet, Oral, Q6H PRN, Pahwani, Rinka R, MD .  Chlorhexidine Gluconate Cloth 2 % PADS 6 each, 6 each, Topical, Daily, Pahwani, Rinka R, MD, 6 each at 03/11/20 1000 .  docusate sodium (COLACE) capsule 100 mg, 100 mg, Oral, Daily PRN, Rigoberto Noel, MD, 100 mg at 03/11/20 1510 .  feeding supplement (ENSURE ENLIVE) (ENSURE ENLIVE) liquid 237 mL, 237 mL, Oral, BID BM, Kara Mead V, MD, 237 mL at 03/11/20 1510 .  heparin ADULT infusion 100 units/mL (25000 units/24mL sodium chloride 0.45%), 800 Units/hr, Intravenous, Continuous, Lyndee Leo, [facility], Last Rate: 8 mL/hr at 03/11/20 2100, 800 Units/hr at 03/11/20 2100 .  heparin injection 1,000-6,000 Units, 1,000-6,000 Units, CRRT, PRN, Harrie Jeans C, MD .  insulin aspart (novoLOG) injection 0-15 Units, 0-15 Units, Subcutaneous, TID WC, Pahwani, Rinka R, MD, 8 Units at 03/11/20 1601 .  insulin aspart (novoLOG) injection 0-5 Units, 0-5 Units, Subcutaneous, QHS, Pahwani, Rinka R, MD, 2 Units at 03/10/20 2133 .  levETIRAcetam (KEPPRA) tablet 250 mg, 250 mg, Oral, BID, Pahwani, Rinka R, MD, 250 mg at 03/11/20 2004 .  levothyroxine (SYNTHROID) tablet 50 mcg, 50 mcg, Oral, QAC breakfast, Pahwani, Rinka R, MD .  linezolid (ZYVOX) tablet 600 mg, 600 mg, Oral, Q12H, Tommy Medal, Lavell Islam, MD, 600 mg at 03/11/20 2302 .  MEDLINE mouth rinse, 15 mL, Mouth Rinse, BID, Rigoberto Noel, MD, 15 mL at 03/11/20  2302 .  montelukast (SINGULAIR) tablet 10 mg, 10 mg, Oral, QHS, Pahwani, Rinka R, MD, 10 mg at 03/11/20 2004 .  multivitamin (RENA-VIT) tablet 1 tablet, 1 tablet, Oral, QHS, Rigoberto Noel, MD, 1 tablet at 03/11/20 2302 .  nitroGLYCERIN (NITROSTAT) SL tablet 0.4 mg, 0.4 mg, Sublingual, Q5 Min x 3 PRN, Pahwani, Rinka R, MD .  ondansetron (ZOFRAN) injection 4 mg, 4 mg, Intravenous, Q6H PRN, Pahwani, Rinka R, MD, 4 mg at 03/11/20 1806 .  pantoprazole (PROTONIX) EC tablet 40 mg, 40 mg, Oral, Daily, Pahwani, Rinka R, MD, 40 mg at 03/11/20 0922 .  phenylephrine CONCENTRATED 100mg  in sodium chloride 0.9% 232mL (0.4mg /mL) infusion, 0-400 mcg/min, Intravenous, Titrated, Rigoberto Noel, MD, Last Rate: 15 mL/hr at 03/11/20 2100, 100 mcg/min at 03/11/20 2100 .  predniSONE (DELTASONE) tablet 10 mg, 10 mg, Oral, Daily, Pahwani, Rinka R, MD, 10 mg at 03/11/20 0811 .  primidone (MYSOLINE) tablet 50 mg, 50 mg, Oral, Daily, Kara Mead V, MD, 50 mg at 03/11/20 1003 .  prismasol BGK 4/2.5 infusion, , CRRT, Continuous, Pearson Grippe B, MD, Last Rate: 1,500 mL/hr at 03/11/20 2036, New Bag at 03/11/20 2036 .  sodium chloride flush (NS) 0.9 % injection 10-40 mL, 10-40 mL, Intracatheter, Q12H, Desai, Rahul P, PA-C, 10 mL at 03/11/20 2304 .  sodium chloride flush (NS) 0.9 % injection 10-40 mL, 10-40 mL, Intracatheter, PRN, Desai, Rahul P, PA-C  Exam: Current vital signs: BP 125/80   Pulse (!) 112   Temp 97.8 F (36.6 C) (Oral)   Resp (!) 26   Wt 87.6 kg   SpO2 99%   BMI 35.32 kg/m  Vital signs in last 24 hours: Temp:  [97.3 F (36.3 C)-98.3 F (36.8 C)] 97.8 F (36.6 C) (07/27 2000) Pulse Rate:  [28-132] 112 (07/27 2100) Resp:  [15-29] 26 (07/27 2100) BP: (71-152)/(26-132) 125/80 (07/27 2100) SpO2:  [97 %-100 %] 99 % (07/27 2100) FiO2 (%):  [40 %] 40 % (07/27 0745) Weight:  [87.6 kg] 87.6 kg (07/27 0500) General: Awake, staring ahead, unresponsive to questions. HEENT: Normocephalic, atraumatic, poor  dentition and oral hygiene Lungs: Clear Cardiovascular: Regular rate rhythm Abdomen: Soft nondistended nontender Extremities warm well perfused with trace edema Neurological exam Awake, appears to be staring straight without responding to questions. Blinks to threat from both sides. Has a rhythmic chewing movement on the mouth. Cranial nerves: Pupils equal round  reactive to light, extraocular movements examination shows neutral gaze with inability to get her to look either way, she blink to threat from both sides, face appears symmetric and has a chewing kind of movement constantly present. Motor exam: No spontaneous movement. No movement to noxious stimulation Sensory exam: As above NIH stroke scale 1a Level of Conscious.: 0 1b LOC Questions: 2 1c LOC Commands: 2 2 Best Gaze: 0 3 Visual: 0 4 Facial Palsy: 0 5a Motor Arm - left: 4 5b Motor Arm - Right: 4 6a Motor Leg - Left: 4 6b Motor Leg - Right: 4 7 Limb Ataxia: 0 8 Sensory: 2 9 Best Language: 3 10 Dysarthria: 2 11 Extinct. and Inatten.: 0 TOTAL: 27  Soon after CT scan completed, patient able to answer simple questions, follows all commands, no focal cranial nerve deficits. No focal motor deficits with antigravity movement in all 4 extremities although legs were bilaterally weaker but symmetric.   Labs I have reviewed labs in epic and the results pertinent to this consultation are:  CBC    Component Value Date/Time   WBC 20.9 (H) 03/11/2020 0335   RBC 3.07 (L) 03/11/2020 0335   HGB 11.2 (L) 03/11/2020 0335   HCT 35.4 (L) 03/11/2020 0335   PLT 171 03/11/2020 0335   MCV 115.3 (H) 03/11/2020 0335   MCH 36.5 (H) 03/11/2020 0335   MCHC 31.6 03/11/2020 0335   RDW 16.5 (H) 03/11/2020 0335   LYMPHSABS 1.3 03/07/2020 0044   MONOABS 0.9 03/07/2020 0044   EOSABS 0.1 03/07/2020 0044   BASOSABS 0.0 03/07/2020 0044    CMP     Component Value Date/Time   NA 135 03/11/2020 1517   K 3.8 03/11/2020 1517   CL 102 03/11/2020  1517   CO2 21 (L) 03/11/2020 1517   GLUCOSE 320 (H) 03/11/2020 1517   BUN 25 (H) 03/11/2020 1517   CREATININE 1.96 (H) 03/11/2020 1517   CREATININE 2.54 (H) 08/02/2018 1411   CALCIUM 8.3 (L) 03/11/2020 1517   PROT 5.0 (L) 03/10/2020 1112   ALBUMIN 2.8 (L) 03/11/2020 1517   AST 432 (H) 03/10/2020 1112   ALT 330 (H) 03/10/2020 1112   ALKPHOS 99 03/10/2020 1112   BILITOT 1.5 (H) 03/10/2020 1112   GFRNONAA 26 (L) 03/11/2020 1517   GFRAA 30 (L) 03/11/2020 1517    Imaging I have reviewed the images obtained:  CT-scan of the brain-no acute changes. No bleed. Stable and normal for age noncontrast appearance of the brain.  Assessment: 69 year old woman who has an extensive past medical history of catatonia, schizoaffective disorder, fibromyalgia, depression, hypertension, hyperlipidemia, documented history of seizures for which she is currently on Keppra-at some point was on Lamictal-unclear why changes were made but was stable, admitted for evaluation of septic shock likely secondary to urosepsis, was usual state of health around 7 PM and on repeat imaging by the cardiac ICU nurses where she is admitted for care, noted to be not responding to questions and had a blank stare with some automatic chewing movements of her mouth. The symptoms lasted for about 15 minutes and spontaneously resolved. I suspect that given her past history of these catatonic episodes, this was one of the catatonic episodes versus a seizure. I do not suspect that this was a stroke given the nonfocal examination but she is currently being evaluated for possible endocarditis-primary team is looking to do a TEE, hence a stroke should be on the differentials. Not a candidate for IV TPA due to being on  heparin. Not a candidate for endovascular thrombectomy due to poor baseline and rapid improvement in symptoms.  Impression: Catatonia versus seizure versus less likely stroke Toxic metabolic  encephalopathy  Recommendations: Ativan 1 mg IV now Continue Keppra and primidone at current doses. Routine EEG in the morning MRI of the brain when able to Management of the septic shock per primary team as you are Management of multiple metabolic derangements per primary team as you are  Neurology will follow with you.  -- Amie Portland, MD [facility] Pager: [phone] If 7pm to 7am, please call on call as listed on AMION.   CRITICAL CARE ATTESTATION Performed by: Amie Portland, MD Total critical care time: 55 minutes Critical care time was exclusive of separately billable procedures and treating other patients and/or supervising APPs/Residents/Students Critical care was necessary to treat or prevent imminent or life-threatening deterioration due to strokelike symptoms, toxic metabolic encephalopathy, catatonia, evaluation for seizure. This patient is critically ill and at significant risk for neurological worsening and/or death and care requires constant monitoring. Critical care was time spent personally by me on the following activities: development of treatment plan with patient and/or surrogate as well as nursing, discussions with consultants, evaluation of patient's response to treatment, examination of patient, obtaining history from patient or surrogate, ordering and performing treatments and interventions, ordering and review of laboratory studies, ordering and review of radiographic studies, pulse oximetry, re-evaluation of patient's condition, participation in multidisciplinary rounds and medical decision making of high complexity in the care of this patient.

## 2020-03-11 NOTE — Progress Notes (Signed)
Hypoglycemic Event  CBG: 56  Treatment: D50 50 mL (25 gm)  Symptoms: Shaky  Follow-up CBG: Time: 0815 CBG Result: 96  Possible Reasons for Event: Inadequate meal intake  Comments/MD notified: Dr. Elsworth Soho notified, orders received to start diet    [place], Ardeth Sportsman

## 2020-03-11 NOTE — Evaluation (Signed)
Clinical/Bedside Swallow Evaluation Patient Details  Name: [name] MRN: [id number] Date of Birth: Jan 10, 1951  Today's Date: 03/11/2020 Time: SLP Start Time (ACUTE ONLY): 1003 SLP Stop Time (ACUTE ONLY): 1023 SLP Time Calculation (min) (ACUTE ONLY): 20 min  Past Medical History:  Past Medical History:  Diagnosis Date  . Anemia   . Angina pectoris ([place])   . Anxiety   . Asthma   . Atrial flutter ([place])   . Back pain   . Barrett esophagus   . Bifascicular block   . BMI 39.0-39.9,adult   . Cellulitis   . CHF (congestive heart failure) ([place])   . Chronic diastolic CHF (congestive heart failure) ([place])   . Constipation   . Contact with and (suspected) exposure to covid-19   . COPD (chronic obstructive pulmonary disease) ([place])   . Dependence on renal dialysis ([place])   . Depression   . Dysphagia   . Dysuria   . Epilepsy ([place])   . ESRD on hemodialysis ([place])   . Essential hypertension   . Fibromyalgia   . Fibromyalgia   . GERD (gastroesophageal reflux disease)   . Gout   . History of pericarditis   . Hypercholesterolemia   . Hyperlipidemia   . Hypothyroid   . Hypothyroidism   . Insomnia   . Long term (current) use of insulin ([place])   . Major depressive disorder   . Memory loss   . Moderate protein-calorie malnutrition ([place])   . Morbid obesity ([name])   . Nephrolithiasis    2008  . Obesity hypoventilation syndrome (HCC)    CPAP  . Obstructive sleep apnea   . Pain in left arm   . PE (pulmonary embolism)    2010  . Peripheral neuropathy   . Persistent atrial fibrillation ([place])   . Pneumonia   . Polyp of nasal sinus   . PTSD (post-traumatic stress disorder)   . Pulmonary hypertension ([place])   . Rheumatoid arteritis ([place])   . Rheumatoid arthritis ([place])   . Schizophrenia ([place])   . 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 Seizures ([place])   . Steroid dependence ([place])   . Tachycardia, unspecified   . Type 2 diabetes mellitus ([place])   . Unspecified intellectual  disabilities   . Unspecified psychosis not due to a substance or known physiological condition ([place])   . Urinary tract infection   . Vitamin D deficiency    Past Surgical History:  Past Surgical History:  Procedure Laterality Date  . A/V FISTULAGRAM N/A 01/28/2020   Procedure: A/V FISTULAGRAM - Right [place];  Surgeon: Waynetta Sandy, MD;  Location: [facility];  Service: Cardiovascular;  Laterality: N/A;  . APPENDECTOMY    . AV FISTULA PLACEMENT Right 09/27/2019   Procedure: ARTERIOVENOUS (AV) FISTULA CREATION;  Surgeon: Waynetta Sandy, MD;  Location: [place];  Service: Vascular;  Laterality: Right;  . AV FISTULA PLACEMENT Right 02/20/2020   Procedure: INSERTION OF ARTERIOVENOUS (AV) GORE-TEX GRAFT ARM;  Surgeon: Waynetta Sandy, MD;  Location: [facility];  Service: Vascular;  Laterality: Right;  . CHOLECYSTECTOMY    . COLONOSCOPY     benign polyps (12/2000), repeat colonoscopy performed in 2007  . ENDOMETRIAL BIOPSY     10/03 benign columnar mucosa (limited material)  . INSERTION OF DIALYSIS CATHETER Right 09/14/2019   Procedure: ATTEMPTED INSERTION OF DIALYSIS CATHETER;  Surgeon: Virl Cagey, MD;  Location: [facility];  Service: General;  Laterality: Right;  . INSERTION OF DIALYSIS CATHETER Right  09/27/2019   Procedure: INSERTION OF DIALYSIS CATHETER;  Surgeon: Waynetta Sandy, MD;  Location: [facility];  Service: Vascular;  Laterality: Right;  . KIDNEY SURGERY    . REVISON OF ARTERIOVENOUS FISTULA Right 02/20/2020   Procedure: REVISON OF ARTERIOVENOUS FISTULA;  Surgeon: Waynetta Sandy, MD;  Location: [place] [facility];  Service: Vascular;  Laterality: Right;   HPI:  69 y.o. female, known to SLP services, with medical history significant for acute-on-chronic dysphagia, chronic hypoxemic respiratory failure secondary to underlying COPD-on 3 L of oxygen via nasal cannula, paroxysmal A. fib-on Eliquis, ESRD on hemodialysis (TTS), diastolic CHF, moderate AS,  hypertension, hyperlipidemia, diabetes mellitus, obstructive sleep apnea/OHS, rheumatoid arthritis, seizure disorder, depression/anxiety, bipolar disorder presented to emergency department 7/26 with SOB and elevated blood glucose. Developed resp failure, transferred to ICU, on BIPAP, CRRT initiated; Dx sepsis secondary to RLL pna, afib with RVR, acute metabolic encephalopathy.  Pt has been followed by SLP during multiple admissions the last several years, with acute decompensations of swallow function.  She has had MBS studies, some of which showed deep penetration and/or aspiration of thin liquids, chin tuck being effective at one time.    Assessment / Plan / Recommendation Clinical Impression  Pt presents with signs of a recurring, likely mild dysphagia, which has been known to become exacerbated during her prior hospitalizations.  Demonstrates adequate coordination of respiratory/breathing cycles; mastication is slightly prolonged but effective.  Thin liquids elicit intermittent wet coughing on <25% of trials, indicative of potential airway intrusion.  For now, recommend thickening liquids to nectar consistency, continue regular solids. May have ice chips between meals. D/W pt and RN.  Will follow to determine necessity of repeat MBS vs diet progression at bedside.    SLP Visit Diagnosis: Dysphagia, oropharyngeal phase (R13.12)    Aspiration Risk  Mild aspiration risk    Diet Recommendation   regular solids (renal diet with fluid restrictions), nectar liquids  Medication Administration: Whole meds with puree    Other  Recommendations Oral Care Recommendations: Oral care BID Other Recommendations: Order thickener from pharmacy   Follow up Recommendations Other (comment) (tba)      Frequency and Duration min 2x/week  2 weeks       Prognosis Prognosis for Safe Diet Advancement: Good      Swallow Study   General Date of Onset: 03/10/20 HPI: 69 y.o. female, known to SLP services, with  medical history significant for acute-on-chronic dysphagia, chronic hypoxemic respiratory failure secondary to underlying COPD-on 3 L of oxygen via nasal cannula, paroxysmal A. fib-on Eliquis, ESRD on hemodialysis (TTS), diastolic CHF, moderate AS, hypertension, hyperlipidemia, diabetes mellitus, obstructive sleep apnea/OHS, rheumatoid arthritis, seizure disorder, depression/anxiety, bipolar disorder presented to emergency department 7/26 with SOB and elevated blood glucose. Developed resp failure, transferred to ICU, on BIPAP, CRRT initiated; Dx sepsis secondary to RLL pna, afib with RVR, acute metabolic encephalopathy.  Pt has been followed by SLP during multiple admissions the last several years, with acute decompensations of swallow function.  She has had MBS studies, some of which showed deep penetration and/or aspiration of thin liquids, chin tuck being effective at one time.  Type of Study: Bedside Swallow Evaluation Previous Swallow Assessment: see HPI Diet Prior to this Study: Regular;Thin liquids Temperature Spikes Noted: No Respiratory Status: Nasal cannula History of Recent Intubation: No Behavior/Cognition: Alert;Cooperative;Pleasant mood Oral Cavity Assessment: Within Functional Limits Oral Care Completed by SLP: Recent completion by staff Oral Cavity - Dentition: Poor condition Vision: Functional for self-feeding Self-Feeding Abilities: Able  to feed self Patient Positioning: Upright in bed Baseline Vocal Quality: Normal Volitional Cough: Strong Volitional Swallow: Able to elicit    Oral/Motor/Sensory Function     Ice Chips Ice chips: Within functional limits   Thin Liquid Thin Liquid: Impaired Presentation: Cup;Straw Pharyngeal  Phase Impairments: Cough - Immediate    Nectar Thick Nectar Thick Liquid: Within functional limits   Honey Thick Honey Thick Liquid: Not tested   Puree Puree: Within functional limits   Solid     Solid: Within functional limits      Juan Quam Laurice 03/11/2020,10:38 AM    Estill Bamberg L. Tivis Ringer, [place] Office number [phone] Pager [phone]

## 2020-03-11 NOTE — Evaluation (Signed)
Physical Therapy Evaluation Patient Details Name: [name] MRN: [id number] DOB: 02/19/1951 Today's Date: 03/11/2020   History of Present Illness  69 yo admitted from SNF with SOB and AFib with RVR, decompensated heart failure and septic shock with metabolic encephalopathy. PMHx: CHF, AFib, ESRD, RA, Schizophrenia  Clinical Impression  Pt very pleasant and very willing to move. Pt on CRRT limiting mobility this date without dizziness or fatigue during session. Pt also limited by bleeding from Rt [place] line. Pt reports walking with assist at [facility] and having been there 2 years with intent to return. Pt with generalized weakness with right shoulder and elbow ROM limitations as well as decreased bil knee extension. Pt with decreased functional mobility and balance who will benefit from acute therapy to maximize mobility, safety and independence to decrease burden of care.   BP 108/89 supine Sitting 121/85 HR 124 SpO2 95-100% on RA    Follow Up Recommendations SNF;Supervision/Assistance - 24 hour    Equipment Recommendations  None recommended by PT    Recommendations for Other Services       Precautions / Restrictions Precautions Precautions: Fall;Other (comment) Precaution Comments: CRRT, Rt  with bleeding due to heparin Restrictions Weight Bearing Restrictions: No      Mobility  Bed Mobility Overal bed mobility: Needs Assistance Bed Mobility: Supine to Sit;Sit to Supine;Rolling Rolling: Mod assist         General bed mobility comments: use of bed egress to chair position, pt able to move trunk towards unsupported sitting, able to maintain static balance with close minguard assist for balance and safety given pt's short stature. Mod assist to roll bil for pad positioning and max +2 to slide toward [facility]  Transfers Overall transfer level: Needs assistance Equipment used: 2 person hand held assist Transfers: Sit to/from Stand Sit to Stand: Mod assist;+2 physical  assistance;+2 safety/equipment         General transfer comment: performed x2 via face to face method and bil knee blocked/guarding, assist to boost and for hip extension, able to maintain static standing with +2 assist approx 15-30 sec each round  Ambulation/Gait             General Gait Details: unable due to CRRT and safety this session  Stairs            Wheelchair Mobility    Modified Rankin (Stroke Patients Only)       Balance Overall balance assessment: Needs assistance Sitting-balance support: Feet supported Sitting balance-Leahy Scale: Fair Sitting balance - Comments: close minguard for sitting blance    Standing balance support: Bilateral upper extremity supported Standing balance-Leahy Scale: Poor Standing balance comment: UE support and external assist                              Pertinent Vitals/Pain Pain Assessment: No/denies pain    Home Living Family/patient expects to be discharged to:: [place]: [place]ford Rile - 2 wheels Additional Comments: Patient reports she was at [facility]    Prior Function Level of Independence: Needs assistance   Gait / Transfers Assistance Needed: pt reports using RW for ambulation with assistance, typically with w/c follow during mobility  ADL's / Homemaking Assistance Needed: receives assist from staff for all ADL, full shower x2/week        Hand Dominance   Dominant Hand:  Right    Extremity/Trunk Assessment   Upper Extremity Assessment Upper Extremity Assessment: Defer to OT evaluation RUE Deficits / Details: limited shoulder ROM, pt reports due to previous fall, unable to achieve full elbow extension  RUE Coordination: decreased gross motor LUE Coordination: decreased gross motor    Lower Extremity Assessment Lower Extremity Assessment: Generalized weakness (pt with decreased knee extension bil with grossly 3/5 strength)    Cervical /  Trunk Assessment Cervical / Trunk Assessment: Kyphotic  Communication   Communication: No difficulties  Cognition Arousal/Alertness: Awake/alert Behavior During Therapy: WFL for tasks assessed/performed Overall Cognitive Status: Within Functional Limits for tasks assessed                                 General Comments: for basic tasks assessed today      General Comments      Exercises General Exercises - Upper Extremity Shoulder Flexion: AAROM;Right;10 reps Elbow Flexion: AROM;AAROM;Both;10 reps;Seated General Exercises - Lower Extremity Long Arc Quad: AROM;Both;Seated;10 reps   Assessment/Plan    PT Assessment Patient needs continued PT services  PT Problem List Decreased mobility;Decreased strength;Decreased range of motion;Decreased activity tolerance;Decreased balance;Decreased knowledge of use of DME;Cardiopulmonary status limiting activity       PT Treatment Interventions DME instruction;Therapeutic exercise;Gait training;Balance training;Functional mobility training;Therapeutic activities;Patient/family education    PT Goals (Current goals can be found in the Care Plan section)  Acute Rehab PT Goals Patient Stated Goal: return to facility, return to walking PT Goal Formulation: With patient Time For Goal Achievement: 03/25/20 Potential to Achieve Goals: Fair    Frequency Min 2X/week   Barriers to discharge        Co-evaluation PT/OT/SLP Co-Evaluation/Treatment: Yes Reason for Co-Treatment: Complexity of the patient's impairments (multi-system involvement);For patient/therapist safety PT goals addressed during session: Mobility/safety with mobility;Strengthening/ROM OT goals addressed during session: Strengthening/ROM       AM-PAC PT "6 Clicks" Mobility  Outcome Measure Help needed turning from your back to your side while in a flat bed without using bedrails?: A Lot Help needed moving from lying on your back to sitting on the side of a  flat bed without using bedrails?: A Lot Help needed moving to and from a bed to a chair (including a wheelchair)?: Total Help needed standing up from a chair using your arms (e.g., wheelchair or bedside chair)?: A Lot Help needed to walk in hospital room?: Total Help needed climbing 3-5 steps with a railing? : Total 6 Click Score: 9    End of Session   Activity Tolerance: Patient tolerated treatment well Patient left: in bed;with call bell/phone within reach Nurse Communication: Mobility status PT Visit Diagnosis: Other abnormalities of gait and mobility (R26.89);Muscle weakness (generalized) (M62.81)    Time: 6789-3810 PT Time Calculation (min) (ACUTE ONLY): 28 min   Charges:   PT Evaluation $PT Eval High Complexity: 1 High          Maija P, PT Acute Rehabilitation Services Pager: [phone] Office: [phone]   Sandy Salaam Prater 03/11/2020, 12:35 PM

## 2020-03-11 NOTE — Consult Note (Signed)
[facility] for Infectious Disease    Date of Admission:  03/10/2020     Total days of antibiotics 2               Reason for Consult: Enterococcal bacteremia    Referring Provider: Tivis Ringer / Auto consult Primary Care Provider: Glenda Chroman, MD   ASSESSMENT:  [name] is a 69 y.o female with atrial fibrillation with RVR  Complicated by enterococcus species bacteremia and concern for possible VRE based on previous urine cultures. Source of infection is unclear. TTE negative for endocarditis. May need to consider TEE when stable. Currently on CRRT per nephrology and requiring vasopressor support. Atrial fibrillation being managed with amiodarone per cardiology. Will treat as if VRE infection and change antibiotics to linezolid. Remain on contact precautions per infection prevention protocol. Repeat blood cultures in the morning and await identification and sensitivities of enterococcus species. Will require line holiday when more stable.   PLAN:  1. Change antibiotics to linezolid.  2.  Recheck blood cultures in the morning. 3.  Monitor for thrombocytopenia with linezolid. 4. CRRT management per nephrology. 5. Await speciation and sensitivity of enterococcus species.  6. Atrial fibrillation management per cardiology.   Principal Problem:   Bacteremia due to Enterococcus Active Problems:   Type 2 diabetes mellitus with diabetic polyneuropathy, without long-term current use of insulin (HCC)   Diabetic peripheral neuropathy (HCC)   HYPERCHOLESTEROLEMIA   Gout, unspecified   Schizophrenia ([place])   Essential hypertension   Acute on chronic diastolic CHF (congestive heart failure) (HCC)   Hypothyroidism   ESRD (end stage renal disease) (HCC)   Elevated troponin   Acute on chronic respiratory failure with hypoxia (HCC)   Atrial fibrillation with RVR (HCC)   Macrocytic anemia   Thrombocytopenia (HCC)   Hyperkalemia   Septic shock (HCC)   Elevated liver enzymes   .  ARIPiprazole  2 mg Oral Daily  . aspirin EC  81 mg Oral Q breakfast  . atorvastatin  20 mg Oral QHS  . calcitRIOL  0.25 mcg Oral Q M,W,F  . calcium acetate  2,001 mg Oral TID WC  . Chlorhexidine Gluconate Cloth  6 each Topical Daily  . insulin aspart  0-15 Units Subcutaneous TID WC  . insulin aspart  0-5 Units Subcutaneous QHS  . levETIRAcetam  250 mg Oral BID  . levothyroxine  50 mcg Oral QAC breakfast  . linezolid  600 mg Oral Q12H  . mouth rinse  15 mL Mouth Rinse BID  . montelukast  10 mg Oral QHS  . pantoprazole  40 mg Oral Daily  . predniSONE  10 mg Oral Daily  . primidone  50 mg Oral Daily  . Resource ThickenUp Clear   Oral Once  . sodium chloride flush  10-40 mL Intracatheter Q12H     HPI: [name] is a 69 y.o. female with previous medical history with multiple medical problems including COPD with chronic oxygen therapy, end-stage renal disease on dialysis, paroxysmal atrial fibrillation on anticoagulation with apixaban, diastolic heart failure, rheumatoid arthritis, type 2 diabetes, hypertension, history of seizures, admitted to the hospital from her skilled nursing facility with shortness of breath and hyperglycemia.  [name] had atrial fibrillation with RVR on admission and was placed on diltiazem.  Chest x-ray with mild progression of vascular congestion and probable interstitial edema with small right effusion and mild progression of right lower lobe airspace disease.  There was concern for right lower lobe  pneumonia and was started on broad-spectrum therapy with vancomycin, cefepime, and metronidazole. Lab work with WBC count of 16.9, BNP, 1486, and lactic acid of 4.9. Blood cultures are now positive for enterococcus species. She has recently had a urine culture with VRE and is currently on contact precautions.   Continues to receive vancomycin, cefepime and metronidazole. Nephrology consulted and now on CRRT. She has been afebrile since admission with most recent  WBC count of 20.9. TTE without evidence of endocarditis and reduced ejection fraction. Has been having some difficulty with urination. Does have some back/flank pain and difficulty with urination.  Review of Systems: Review of Systems  Constitutional: Negative for chills, fever and weight loss.  Respiratory: Negative for cough, shortness of breath and wheezing.   Cardiovascular: Negative for chest pain and leg swelling.  Gastrointestinal: Negative for abdominal pain, constipation, diarrhea, nausea and vomiting.  Skin: Negative for rash.     Past Medical History:  Diagnosis Date  . Anemia   . Angina pectoris ([place])   . Anxiety   . Asthma   . Atrial flutter ([place])   . Back pain   . Barrett esophagus   . Bifascicular block   . BMI 39.0-39.9,adult   . Cellulitis   . CHF (congestive heart failure) ([place])   . Chronic diastolic CHF (congestive heart failure) ([place])   . Constipation   . Contact with and (suspected) exposure to covid-19   . COPD (chronic obstructive pulmonary disease) ([place])   . Dependence on renal dialysis ([place])   . Depression   . Dysphagia   . Dysuria   . Epilepsy ([place])   . ESRD on hemodialysis ([place])   . Essential hypertension   . Fibromyalgia   . Fibromyalgia   . GERD (gastroesophageal reflux disease)   . Gout   . History of pericarditis   . Hypercholesterolemia   . Hyperlipidemia   . Hypothyroid   . Hypothyroidism   . Insomnia   . Long term (current) use of insulin ([place])   . Major depressive disorder   . Memory loss   . Moderate protein-calorie malnutrition ([place])   . Morbid obesity ([place])   . Nephrolithiasis    2008  . Obesity hypoventilation syndrome (HCC)    CPAP  . Obstructive sleep apnea   . Pain in left arm   . PE (pulmonary embolism)    2010  . Peripheral neuropathy   . Persistent atrial fibrillation ([place])   . Pneumonia   . Polyp of nasal sinus   . PTSD (post-traumatic stress disorder)   . Pulmonary hypertension ([place])   . Rheumatoid  arteritis ([place])   . Rheumatoid arthritis ([place])   . Schizophrenia ([place])   . 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 Seizures ([place])   . 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 Tachycardia, unspecified   . Type 2 diabetes mellitus ([place])   . Unspecified intellectual disabilities   . Unspecified psychosis not due to a substance or known physiological condition ([place])   . Urinary tract infection   . Vitamin D deficiency     Social History   Tobacco Use  . Smoking status: Former Smoker    Types: Cigarettes    Quit date: 06/23/1972    Years since quitting: 47.7  . Smokeless tobacco: Never Used  Vaping Use  . Vaping Use: Never used  Substance Use Topics  . Alcohol use: No  . Drug use: No    Family History  Problem Relation Age of Onset  .  Bone cancer Mother   . Hypertension Mother   . Heart disease Mother   . COPD Father   . Heart disease Father   . Stroke Father     Allergies  Allergen Reactions  . Benztropine Mesylate Other (See Comments)    Alopecia and white spots on eyes  . Codeine Swelling, Palpitations and Other (See Comments)    Mouth Swelling and Tachycardia   . Diazepam Other (See Comments)    COMA  . Diphenhydramine Hcl Anxiety, Palpitations and Other (See Comments)    Tachycardia and anxiousness  . Penicillins Swelling  . Sulfonamide Derivatives Rash    Blisters, also  . Thioridazine Hcl Swelling  . Bee Venom     Documented on MAR  . Lisinopril Cough    OBJECTIVE: Blood pressure 115/75, pulse (!) 115, temperature (!) 97.3 F (36.3 C), temperature source Axillary, resp. rate 18, weight 87.6 kg, SpO2 100 %.  Physical Exam Constitutional:      General: She is not in acute distress.    Appearance: She is well-developed. She is obese. She is not ill-appearing.     Interventions: Nasal cannula in place.  Cardiovascular:     Rate and Rhythm: Regular rhythm. Tachycardia present.     Heart sounds: Normal heart sounds.     Comments: Dialysis cathter located in  right neck with central line located in left neck.  Pulmonary:     Effort: Pulmonary effort is normal. Tachypnea present.     Breath sounds: Normal breath sounds.  Skin:    General: Skin is warm and dry.  Neurological:     Mental Status: She is alert and oriented to person, place, and time.  Psychiatric:        Behavior: Behavior normal.        Thought Content: Thought content normal.        Judgment: Judgment normal.     Lab Results Lab Results  Component Value Date   WBC 20.9 (H) 03/11/2020   HGB 11.2 (L) 03/11/2020   HCT 35.4 (L) 03/11/2020   MCV 115.3 (H) 03/11/2020   PLT 171 03/11/2020    Lab Results  Component Value Date   CREATININE 2.61 (H) 03/11/2020   BUN 36 (H) 03/11/2020   NA 140 03/11/2020   K 3.8 03/11/2020   CL 105 03/11/2020   CO2 21 (L) 03/11/2020    Lab Results  Component Value Date   ALT 330 (H) 03/10/2020   AST 432 (H) 03/10/2020   ALKPHOS 99 03/10/2020   BILITOT 1.5 (H) 03/10/2020     Microbiology: Recent Results (from the past 240 hour(s))  SARS Coronavirus 2 by RT PCR (hospital order, performed in [facility] hospital lab) Nasopharyngeal Nasopharyngeal Swab     Status: None   Collection Time: 03/07/20  1:13 AM   Specimen: Nasopharyngeal Swab  Result Value Ref Range Status   SARS Coronavirus 2 NEGATIVE NEGATIVE Final    Comment: (NOTE) SARS-CoV-2 target nucleic acids are NOT DETECTED.  The SARS-CoV-2 RNA is generally detectable in upper and lower respiratory specimens during the acute phase of infection. The lowest concentration of SARS-CoV-2 viral copies this assay can detect is 250 copies / mL. A negative result does not preclude SARS-CoV-2 infection and should not be used as the sole basis for treatment or other patient management decisions.  A negative result may occur with improper specimen collection / handling, submission of specimen other than nasopharyngeal swab, presence of viral mutation(s) within the areas targeted  by this  assay, and inadequate number of viral copies (<250 copies / mL). A negative result must be combined with clinical observations, patient history, and epidemiological information.  Fact Sheet for Patients:   StrictlyIdeas.no  Fact Sheet for Healthcare Providers: BankingDealers.co.za  This test is not yet approved or  cleared by the [place] FDA and has been authorized for detection and/or diagnosis of SARS-CoV-2 by FDA under an Emergency Use Authorization (EUA).  This EUA will remain in effect (meaning this test can be used) for the duration of the COVID-19 declaration under Section 564(b)(1) of the Act, 21 U.S.C. section 360bbb-3(b)(1), unless the authorization is terminated or revoked sooner.  Performed at [facility], [address] [place], [place] [address]   SARS Coronavirus 2 by RT PCR (hospital order, performed in [facility] hospital lab) Nasopharyngeal Nasopharyngeal Swab     Status: None   Collection Time: 03/10/20 10:32 AM   Specimen: Nasopharyngeal Swab  Result Value Ref Range Status   SARS Coronavirus 2 NEGATIVE NEGATIVE Final    Comment: (NOTE) SARS-CoV-2 target nucleic acids are NOT DETECTED.  The SARS-CoV-2 RNA is generally detectable in upper and lower respiratory specimens during the acute phase of infection. The lowest concentration of SARS-CoV-2 viral copies this assay can detect is 250 copies / mL. A negative result does not preclude SARS-CoV-2 infection and should not be used as the sole basis for treatment or other patient management decisions.  A negative result may occur with improper specimen collection / handling, submission of specimen other than nasopharyngeal swab, presence of viral mutation(s) within the areas targeted by this assay, and inadequate number of viral copies (<250 copies / mL). A negative result must be combined with clinical observations, patient history, and epidemiological  information.  Fact Sheet for Patients:   StrictlyIdeas.no  Fact Sheet for Healthcare Providers: BankingDealers.co.za  This test is not yet approved or  cleared by the [place] FDA and has been authorized for detection and/or diagnosis of SARS-CoV-2 by FDA under an Emergency Use Authorization (EUA).  This EUA will remain in effect (meaning this test can be used) for the duration of the COVID-19 declaration under Section 564(b)(1) of the Act, 21 U.S.C. section 360bbb-3(b)(1), unless the authorization is terminated or revoked sooner.  Performed at [facility], [place] [address] [place], [place] [address]   Blood Culture (routine x 2)     Status: None (Preliminary result)   Collection Time: 03/10/20 11:12 AM   Specimen: BLOOD  Result Value Ref Range Status   Specimen Description BLOOD LEFT ANTECUBITAL  Final   Special Requests   Final    BOTTLES DRAWN AEROBIC AND ANAEROBIC Blood Culture adequate volume   Culture  Setup Time   Final    GRAM POSITIVE COCCI IN CHAINS IN BOTH AEROBIC AND ANAEROBIC BOTTLES Organism ID to follow CRITICAL RESULT CALLED TO, READ BACK BY AND VERIFIED WITH: PHARMD VERANDA BRICK AT [place] ON 03/11/2020 Performed at [facility], [place] [address] [place], [place] [address]    Culture [facility] POSITIVE COCCI  Final   Report Status PENDING  Incomplete  Blood Culture ID Panel (Reflexed)     Status: Abnormal   Collection Time: 03/10/20 11:12 AM  Result Value Ref Range Status   Enterococcus species DETECTED (A) NOT DETECTED Final    Comment: CRITICAL RESULT CALLED TO, READ BACK BY AND VERIFIED WITH: PHARMD VERANDA BRICK AT 0052 BY MESSAN HOUEGNIFIO ON 03/11/2020  Vancomycin resistance NOT DETECTED NOT DETECTED Final   Listeria monocytogenes NOT DETECTED NOT DETECTED Final   Staphylococcus species NOT DETECTED NOT DETECTED Final   Staphylococcus aureus (BCID) NOT DETECTED NOT  DETECTED Final   Streptococcus species NOT DETECTED NOT DETECTED Final   Streptococcus agalactiae NOT DETECTED NOT DETECTED Final   Streptococcus pneumoniae NOT DETECTED NOT DETECTED Final   Streptococcus pyogenes NOT DETECTED NOT DETECTED Final   Acinetobacter baumannii NOT DETECTED NOT DETECTED Final   Enterobacteriaceae species NOT DETECTED NOT DETECTED Final   Enterobacter cloacae complex NOT DETECTED NOT DETECTED Final   Escherichia coli NOT DETECTED NOT DETECTED Final   Klebsiella oxytoca NOT DETECTED NOT DETECTED Final   Klebsiella pneumoniae NOT DETECTED NOT DETECTED Final   Proteus species NOT DETECTED NOT DETECTED Final   Serratia marcescens NOT DETECTED NOT DETECTED Final   Haemophilus influenzae NOT DETECTED NOT DETECTED Final   Neisseria meningitidis NOT DETECTED NOT DETECTED Final   Pseudomonas aeruginosa NOT DETECTED NOT DETECTED Final   Candida albicans NOT DETECTED NOT DETECTED Final   Candida glabrata NOT DETECTED NOT DETECTED Final   Candida krusei NOT DETECTED NOT DETECTED Final   Candida parapsilosis NOT DETECTED NOT DETECTED Final   Candida tropicalis NOT DETECTED NOT DETECTED Final    Comment: Performed at [facility], [address] [place], [place] [address]  Blood Culture (routine x 2)     Status: None (Preliminary result)   Collection Time: 03/10/20 11:35 AM   Specimen: BLOOD LEFT FOREARM  Result Value Ref Range Status   Specimen Description BLOOD LEFT FOREARM  Final   Special Requests   Final    BOTTLES DRAWN AEROBIC AND ANAEROBIC Blood Culture adequate volume   Culture  Setup Time   Final    GRAM POSITIVE COCCI IN CHAINS IN BOTH AEROBIC AND ANAEROBIC BOTTLES CRITICAL VALUE NOTED.  VALUE IS CONSISTENT WITH PREVIOUSLY REPORTED AND CALLED VALUE. Performed at [facility], [place] [address] [place], [place] [address]    Culture GRAM POSITIVE COCCI  Final   Report Status PENDING  Incomplete  MRSA PCR Screening     Status: Abnormal   Collection  Time: 03/10/20  6:08 PM   Specimen: Nasopharyngeal  Result Value Ref Range Status   MRSA by PCR (A) NEGATIVE Final    INVALID, UNABLE TO DETERMINE THE PRESENCE OF TARGET DUE TO SPECIMEN INTEGRITY. RECOLLECTION REQUESTED.    Comment: A,KOTEY @[id number]  03/10/20 EB Performed at [place] [address] [place], [place] [address]   MRSA PCR Screening     Status: None   Collection Time: 03/11/20  8:17 AM   Specimen: Nasal Mucosa; Nasopharyngeal  Result Value Ref Range Status   MRSA by PCR NEGATIVE NEGATIVE Final    Comment:        The GeneXpert MRSA Assay (FDA approved for NASAL specimens only), is one component of a comprehensive MRSA colonization surveillance program. It is not intended to diagnose MRSA infection nor to guide or monitor treatment for MRSA infections. Performed at [facility], [place] [address], [place], [place] [address]      Terri Piedra, [facility] for Infectious Disease [facility]  03/11/2020  11:35 AM

## 2020-03-11 NOTE — Progress Notes (Signed)
[place] for heparin Indication: atrial fibrillation  Allergies  Allergen Reactions  . Benztropine Mesylate Other (See Comments)    Alopecia and white spots on eyes  . Codeine Swelling, Palpitations and Other (See Comments)    Mouth Swelling and Tachycardia   . Diazepam Other (See Comments)    COMA  . Diphenhydramine Hcl Anxiety, Palpitations and Other (See Comments)    Tachycardia and anxiousness  . Penicillins Swelling  . Sulfonamide Derivatives Rash    Blisters, also  . Thioridazine Hcl Swelling  . Bee Venom     Documented on MAR  . Lisinopril Cough    Patient Measurements: Weight: 87.6 kg (193 lb 2 oz)  Vital Signs: Temp: 98.2 F (36.8 C) (07/27 1600) Temp Source: Axillary (07/27 1600) BP: 110/82 (07/27 1900) Pulse Rate: 118 (07/27 1800)  Labs: Recent Labs    03/10/20 1032 03/10/20 1032 03/10/20 1048 03/10/20 1048 03/10/20 1050 03/10/20 1112 03/10/20 1112 03/10/20 1323 03/11/20 0335 03/11/20 1137 03/11/20 1517 03/11/20 2044  HGB 11.2*   < > 12.2   < > 11.9*  --   --   --  11.2*  --   --   --   HCT 35.9*   < > 36.0  --  35.0*  --   --   --  35.4*  --   --   --   PLT 138*  --   --   --   --   --   --   --  171  --   --   --   APTT  --   --   --   --   --  33   < >  --  98* 107*  --  77*  LABPROT  --   --   --   --   --  23.0*  --   --  24.3*  --   --   --   INR  --   --   --   --   --  2.1*  --   --  2.3*  --   --   --   HEPARINUNFRC  --   --   --   --   --   --   --   --  >2.20*  --   --   --   CREATININE 4.40*   < > 4.20*  --   --   --   --   --  2.61*  --  1.96*  --   TROPONINIHS 42*  --   --   --   --   --   --  56*  --   --   --   --    < > = values in this interval not displayed.    Estimated Creatinine Clearance: 28.2 mL/min (A) (by C-G formula based on SCr of 1.96 mg/dL (H)).  Assessment: 69 yo f presenting with Afib RVR - on apixaban PTA. Apixaban held per cards and pharmacy dosing heparin.  -aPTT=  77  Goal of Therapy:  Heparin level 0.3-0.7 units/ml aPTT 66 - 102 seconds Monitor platelets by anticoagulation protocol: Yes   Plan:  No heparin changes needed Daily heparin level and aptt  Hildred Laser, PharmD Clinical Pharmacist **Pharmacist phone directory can now be found on [email or web address] ([email or web address]).  Listed under [place].

## 2020-03-11 NOTE — Progress Notes (Addendum)
Progress Note  Patient Name: [name] Date of Encounter: 03/11/2020  [facility] Cardiologist: Rozann Lesches, MD   Subjective   Patient more alert and communicative on BiPAP  Inpatient Medications    Scheduled Meds: . ARIPiprazole  2 mg Oral Daily  . aspirin EC  81 mg Oral Q breakfast  . atorvastatin  20 mg Oral QHS  . buPROPion  150 mg Oral Daily  . calcitRIOL  0.25 mcg Oral Q M,W,F  . calcium acetate  2,001 mg Oral TID WC  . chlorhexidine  15 mL Mouth Rinse BID  . Chlorhexidine Gluconate Cloth  6 each Topical Daily  . dextrose      . insulin aspart  0-15 Units Subcutaneous TID WC  . insulin aspart  0-5 Units Subcutaneous QHS  . levETIRAcetam  250 mg Oral BID  . levothyroxine  50 mcg Oral QAC breakfast  . mouth rinse  15 mL Mouth Rinse q12n4p  . montelukast  10 mg Oral QHS  . pantoprazole  40 mg Oral Daily  . perflutren lipid microspheres (DEFINITY) IV suspension      . predniSONE  10 mg Oral Daily  . sodium chloride flush  10-40 mL Intracatheter Q12H  . traZODone  50 mg Oral QHS   Continuous Infusions: .  prismasol BGK 4/2.5 400 mL/hr at 03/10/20 1836  .  prismasol BGK 4/2.5 300 mL/hr at 03/10/20 1836  . sodium chloride    . amiodarone 30 mg/hr (03/11/20 0700)  . ceFEPime (MAXIPIME) IV Stopped (03/11/20 0033)  . heparin 950 Units/hr (03/11/20 0700)  . phenylephrine (NEO-SYNEPHRINE) Adult infusion 150 mcg/min (03/11/20 0700)  . prismasol BGK 2/2.5 dialysis solution 1,800 mL/hr at 03/11/20 0634  . vancomycin     PRN Meds: acetaminophen, albuterol, calcium carbonate, heparin, nitroGLYCERIN, ondansetron (ZOFRAN) IV, sodium chloride flush   Vital Signs    Vitals:   03/11/20 0700 03/11/20 0715 03/11/20 0730 03/11/20 0745  BP: (!) 114/97 (!) 98/61 (!) 90/41 (!) 103/33  Pulse:  (!) 116 (!) 107 (!) 125  Resp: 21 22 15 18   Temp:      TempSrc:      SpO2:  100% 100% 100%  Weight:        Intake/Output Summary (Last 24 hours) at 03/11/2020 0804 Last  data filed at 03/11/2020 0700 Gross per 24 hour  Intake 4046.87 ml  Output 16 ml  Net 4030.87 ml   Last 3 Weights 03/11/2020 03/10/2020 03/06/2020  Weight (lbs) 193 lb 2 oz 188 lb 7.9 oz 198 lb 6.6 oz  Weight (kg) 87.6 kg 85.5 kg 90 kg      Telemetry    A. fib with RVR, rates in the 1 10-1 30 range- Personally Reviewed  ECG    Not performed today- Personally Reviewed  Physical Exam   GEN: No acute distress.   Neck: No JVD Cardiac:  Irregularly irregular, tachycardic Respiratory: Clear to auscultation bilaterally. GI: Soft, nontender, non-distended  MS: No edema; No deformity. Neuro:  Nonfocal  Psych: Were communicative this morning  Labs    High Sensitivity Troponin:   Recent Labs  Lab 03/10/20 1032 03/10/20 1323  TROPONINIHS 42* 56*      Chemistry Recent Labs  Lab 03/07/20 0044 03/07/20 0044 03/10/20 1032 03/10/20 1032 03/10/20 1048 03/10/20 1050 03/10/20 1112 03/11/20 0335  NA 137   < > 138   < > 139 139  --  140  K 3.0*   < > 6.0*   < >  5.8* 5.9*  --  3.8  CL 101   < > 102  --  102  --   --  105  CO2 28  --  21*  --   --   --   --  21*  GLUCOSE 255*   < > 418*  --  420*  --   --  111*  BUN 24*   < > 53*  --  80*  --   --  36*  CREATININE 2.20*   < > 4.40*  --  4.20*  --   --  2.61*  CALCIUM 8.5*  --  9.4  --   --   --   --  8.3*  PROT  --   --   --   --   --   --  5.0*  --   ALBUMIN  --   --   --   --   --   --  3.1* 2.9*  AST  --   --   --   --   --   --  432*  --   ALT  --   --   --   --   --   --  330*  --   ALKPHOS  --   --   --   --   --   --  99  --   BILITOT  --   --   --   --   --   --  1.5*  --   GFRNONAA 22*  --  10*  --   --   --   --  18*  GFRAA 26*  --  11*  --   --   --   --  21*  ANIONGAP 8  --  15  --   --   --   --  14   < > = values in this interval not displayed.     Hematology Recent Labs  Lab 03/07/20 0044 03/07/20 0044 03/10/20 1032 03/10/20 1032 03/10/20 1048 03/10/20 1050 03/11/20 0335  WBC 9.3  --  16.9*  --   --    --  20.9*  RBC 2.68*  --  3.13*  --   --   --  3.07*  HGB 9.8*   < > 11.2*   < > 12.2 11.9* 11.2*  HCT 29.7*   < > 35.9*   < > 36.0 35.0* 35.4*  MCV 110.8*  --  114.7*  --   --   --  115.3*  MCH 36.6*  --  35.8*  --   --   --  36.5*  MCHC 33.0  --  31.2  --   --   --  31.6  RDW 15.9*  --  16.2*  --   --   --  16.5*  PLT 169  --  138*  --   --   --  171   < > = values in this interval not displayed.    BNP Recent Labs  Lab 03/10/20 1032  BNP 1,486.7*     DDimer No results for input(s): DDIMER in the last 168 hours.   Radiology    DG Chest Port 1 View  Result Date: 03/10/2020 CLINICAL DATA:  Status post central line placement EXAM: PORTABLE CHEST 1 VIEW COMPARISON:  Film from earlier in the same day. FINDINGS: Cardiac shadow is enlarged but stable. Dialysis catheter on the left is again seen. Right-sided  central venous line is noted with catheter tip at the cavoatrial junction. No pneumothorax is seen. Persistent airspace opacity in the right base is noted. No other focal abnormality is noted. IMPRESSION: No pneumothorax following central line placement. Electronically Signed   By: Inez Catalina M.D.   On: 03/10/2020 23:51   DG Chest Port 1 View  Result Date: 03/10/2020 CLINICAL DATA:  Short of breath, chest pain EXAM: PORTABLE CHEST 1 VIEW COMPARISON:  03/07/2020 FINDINGS: Cardiac enlargement with vascular congestion and mild interstitial edema. Mild right lower lobe patchy airspace disease and small right effusion. Left jugular central venous catheter tip in the SVC unchanged. No pneumothorax. IMPRESSION: Cardiac enlargement with mild progression of vascular congestion and probable interstitial edema. Small right effusion. Mild progression of right lower lobe airspace disease. Electronically Signed   By: Franchot Gallo M.D.   On: 03/10/2020 10:47   US Abdomen Limited RUQ  Result Date: 03/10/2020 CLINICAL DATA:  Elevated LFTs EXAM: ULTRASOUND ABDOMEN LIMITED RIGHT UPPER QUADRANT  COMPARISON:  None. FINDINGS: Gallbladder: Surgically removed Common bile duct: Diameter: 4.5 mm Liver: Diffusely increased in echogenicity consistent with fatty infiltration. No mass is seen. Note is made of a small right pleural effusion. Other: None. IMPRESSION: Status post cholecystectomy. Fatty infiltration of the liver. No acute abnormality in the upper abdomen is seen. Incidental note of right pleural effusion. Electronically Signed   By: Inez Catalina M.D.   On: 03/10/2020 21:03    Cardiac Studies   None yet although 2D echo at bedside suggests EF in the 45 to 50% range  Patient Profile     [name] is a 69 y.o. female with a hx of chronic diastolic HF, persistent atrial fibrillation/flutter on anticoagulation with Eliquis, anemia, mild to moderate aortic stenosis, ESRD on HD, HTN, HLD, bifascicular block (known RBBB + LAFB), COPD, DM 2, history of PE, seizure disorder, schizophrenia, Barrett's esophagus, memory loss and OSA who was seen yesterday in the emergency room for A. fib with RVR and hypotension sec related to septic shock  Assessment & Plan    1: Persistent atrial fibrillation with RVR-patient has persistent A. fib on negative chronotropic at home and Eliquis.  We saw her in the ER her heart rate was in the 1 5170 range probably secondary to septic shock.  Her oral negative chronotropic's were discontinued because of hypotension as well as her Eliquis and she was begun on IV heparin.  Initially she was tried on IV diltiazem which was discontinued because of continued hypotension.  She received an amiodarone bolus followed by infusion and now his heart rates in the 110 120 range.  I suspect this is all driven by her infectious process and hypotension.  Once this clears we will consider reinstituting her rate control agents.  2: Sepsis secondary to right lower lobe pneumonia-on cefepime and vancomycin.  She is afebrile.  Her white count is 20,000.    3: Acute on chronic  respiratory failure with diastolic dysfunction.-Echo today showed an EF in the 50% range.  She does have moderately severe TR.  Her volume status is being managed by renal replacement therapy.  4: Moderate aortic stenosis-unchanged  5: Hypotension/shock-on Neo-Synephrine with blood pressures in the low 100 range  6: End-stage renal disease-on hemodialysis  7: Elevated troponins-troponins went to 56.  This is in the setting of end-stage renal disease on hemodialysis and most likely demand ischemia from septic shock.  I do not think this is related to acute myocardial ischemia.  8: Elevated liver enzymes-most likely related to septic shock  Ms. Wachtel appears clinically improved this morning.  She is mentating on BiPAP with excellent saturations.  Blood pressure is improved on Neo-Synephrine and her heart rate is improved as well on IV amiodarone.  She is getting antibiotics for community-acquired pneumonia.  We will continue to follow her with you.       For questions or updates, please contact [place] Please consult [email or web address] for contact info under        Signed, Quay Burow, MD  03/11/2020, 8:04 AM

## 2020-03-11 NOTE — Procedures (Signed)
Admit: 03/10/2020 LOS: 1  63F ESRD with septic shock, AFib/RVR, hyperkalemia, enterococcal bacteremia  Current CRRT Prescription: Start Date: 03/10/20 Catheter: L IJ TDC BFR: 200 Pre Blood Pump: 400 4K DFR: 1800 2K Replacement Rate: 300 4K Goal UF: net even Anticoagulation: systemic heparin Clotting: none K 3.8, P 3.2   S: Progressive shock yesterday PM to ICU, started CRRT PUlse into 110s on amio, heparin On PE gtt 4L [place] / BiPAP Enterococcus detected on BCx LUE  Had Revision of RUE AVF to AVG on 7/7, is superficial and strong B/T  O: 07/26 0701 - 07/27 0700 In: 4030.2 [I.V.:3385.4; IV Piggyback:644.8] Out: 16   Filed Weights   03/10/20 1800 03/11/20 0500  Weight: 85.5 kg 87.6 kg    Recent Labs  Lab 03/07/20 0044 03/07/20 0044 03/10/20 1032 03/10/20 1032 03/10/20 1048 03/10/20 1050 03/11/20 0335  NA 137   < > 138   < > 139 139 140  K 3.0*   < > 6.0*   < > 5.8* 5.9* 3.8  CL 101   < > 102  --  102  --  105  CO2 28  --  21*  --   --   --  21*  GLUCOSE 255*   < > 418*  --  420*  --  111*  BUN 24*   < > 53*  --  80*  --  36*  CREATININE 2.20*   < > 4.40*  --  4.20*  --  2.61*  CALCIUM 8.5*  --  9.4  --   --   --  8.3*  PHOS  --   --   --   --   --   --  3.2   < > = values in this interval not displayed.   Recent Labs  Lab 03/07/20 0044 03/07/20 0044 03/10/20 1032 03/10/20 1032 03/10/20 1048 03/10/20 1050 03/11/20 0335  WBC 9.3  --  16.9*  --   --   --  20.9*  NEUTROABS 7.0  --   --   --   --   --   --   HGB 9.8*   < > 11.2*   < > 12.2 11.9* 11.2*  HCT 29.7*   < > 35.9*   < > 36.0 35.0* 35.4*  MCV 110.8*  --  114.7*  --   --   --  115.3*  PLT 169  --  138*  --   --   --  171   < > = values in this interval not displayed.    Scheduled Meds: . ARIPiprazole  2 mg Oral Daily  . aspirin EC  81 mg Oral Q breakfast  . atorvastatin  20 mg Oral QHS  . buPROPion  150 mg Oral Daily  . calcitRIOL  0.25 mcg Oral Q M,W,F  . calcium acetate  2,001 mg Oral TID  WC  . chlorhexidine  15 mL Mouth Rinse BID  . Chlorhexidine Gluconate Cloth  6 each Topical Daily  . insulin aspart  0-15 Units Subcutaneous TID WC  . insulin aspart  0-5 Units Subcutaneous QHS  . levETIRAcetam  250 mg Oral BID  . levothyroxine  50 mcg Oral QAC breakfast  . mouth rinse  15 mL Mouth Rinse q12n4p  . montelukast  10 mg Oral QHS  . pantoprazole  40 mg Oral Daily  . predniSONE  10 mg Oral Daily  . sodium chloride flush  10-40 mL Intracatheter Q12H  . traZODone  50 mg Oral QHS   Continuous Infusions: .  prismasol BGK 4/2.5 400 mL/hr at 03/11/20 0804  .  prismasol BGK 4/2.5 300 mL/hr at 03/10/20 1836  . sodium chloride    . amiodarone 30 mg/hr (03/11/20 0800)  . ceFEPime (MAXIPIME) IV Stopped (03/11/20 0033)  . heparin 950 Units/hr (03/11/20 0800)  . phenylephrine (NEO-SYNEPHRINE) Adult infusion 200 mcg/min (03/11/20 0800)  . prismasol BGK 2/2.5 dialysis solution 1,800 mL/hr at 03/11/20 0634  . vancomycin     PRN Meds:.acetaminophen, albuterol, calcium carbonate, heparin, nitroGLYCERIN, ondansetron (ZOFRAN) IV, sodium chloride flush  ABG    Component Value Date/Time   PHART 7.376 03/10/2020 1719   PCO2ART 41.1 03/10/2020 1719   PO2ART 93.2 03/10/2020 1719   HCO3 23.9 03/10/2020 1719   TCO2 28 03/10/2020 1050   ACIDBASEDEF 0.8 03/10/2020 1719   O2SAT 96.8 03/10/2020 1719    A/P  1. ESRD on CRRT L IJ TDC 2. Septic shock 2/2 enterococcal bacteremia, on PE 3.  RUE AVG placed 7/7 4. AFib with RVR, amio + systemic heparin 5. HfPEF 6. Hypoxic RF on BiPAP / [place] 7. AMS improved 8. DM2 9. RA on steroids  Move to all 4K. STart UF up to 67mL/h neg.  Once off CRRT will plan to transition to iHD and use AVG.  No hep in circuit.  Trend K and P.    Pearson Grippe, MD [facility]

## 2020-03-11 NOTE — Progress Notes (Signed)
[place] Progress Note Patient Name: [name] DOB: 01/20/1951 MRN: [id number]   Date of Service  03/11/2020  HPI/Events of Note  Notified of acute mental status change. Exam non -focal. On heparin drip for afib. Glucose > 200  eICU Interventions  Neurology called and patient is for stat head CT. Hold heparin drip for now  And resume once head bleed ruled out.     Intervention Category Major Interventions: Change in mental status - evaluation and management  [name] 03/11/2020, 10:04 PM

## 2020-03-11 NOTE — Progress Notes (Signed)
Patient tolerating off BIPAP well. RT will continue to monitor.

## 2020-03-11 NOTE — Progress Notes (Signed)
Echocardiogram 2D Echocardiogram has been performed.  Oneal Deputy Aloura Matsuoka 03/11/2020, 8:19 AM   Dr. Marlou Porch notified of stat

## 2020-03-12 ENCOUNTER — Inpatient Hospital Stay (HOSPITAL_COMMUNITY): Payer: Medicare Other

## 2020-03-12 ENCOUNTER — Encounter (HOSPITAL_COMMUNITY): Payer: Self-pay | Admitting: Pulmonary Disease

## 2020-03-12 DIAGNOSIS — J449 Chronic obstructive pulmonary disease, unspecified: Secondary | ICD-10-CM

## 2020-03-12 DIAGNOSIS — J9621 Acute and chronic respiratory failure with hypoxia: Secondary | ICD-10-CM | POA: Diagnosis not present

## 2020-03-12 DIAGNOSIS — A4181 Sepsis due to Enterococcus: Principal | ICD-10-CM

## 2020-03-12 DIAGNOSIS — R112 Nausea with vomiting, unspecified: Secondary | ICD-10-CM

## 2020-03-12 DIAGNOSIS — R7881 Bacteremia: Secondary | ICD-10-CM | POA: Diagnosis not present

## 2020-03-12 DIAGNOSIS — E1122 Type 2 diabetes mellitus with diabetic chronic kidney disease: Secondary | ICD-10-CM

## 2020-03-12 DIAGNOSIS — Z992 Dependence on renal dialysis: Secondary | ICD-10-CM

## 2020-03-12 DIAGNOSIS — I120 Hypertensive chronic kidney disease with stage 5 chronic kidney disease or end stage renal disease: Secondary | ICD-10-CM

## 2020-03-12 DIAGNOSIS — R569 Unspecified convulsions: Secondary | ICD-10-CM

## 2020-03-12 DIAGNOSIS — R0602 Shortness of breath: Secondary | ICD-10-CM | POA: Diagnosis not present

## 2020-03-12 DIAGNOSIS — E669 Obesity, unspecified: Secondary | ICD-10-CM

## 2020-03-12 DIAGNOSIS — A419 Sepsis, unspecified organism: Secondary | ICD-10-CM | POA: Diagnosis not present

## 2020-03-12 DIAGNOSIS — M109 Gout, unspecified: Secondary | ICD-10-CM

## 2020-03-12 DIAGNOSIS — D631 Anemia in chronic kidney disease: Secondary | ICD-10-CM

## 2020-03-12 DIAGNOSIS — D509 Iron deficiency anemia, unspecified: Secondary | ICD-10-CM

## 2020-03-12 DIAGNOSIS — E7800 Pure hypercholesterolemia, unspecified: Secondary | ICD-10-CM

## 2020-03-12 DIAGNOSIS — R6521 Severe sepsis with septic shock: Secondary | ICD-10-CM | POA: Diagnosis not present

## 2020-03-12 DIAGNOSIS — I5033 Acute on chronic diastolic (congestive) heart failure: Secondary | ICD-10-CM | POA: Diagnosis not present

## 2020-03-12 DIAGNOSIS — I4891 Unspecified atrial fibrillation: Secondary | ICD-10-CM | POA: Diagnosis not present

## 2020-03-12 LAB — CULTURE, BLOOD (ROUTINE X 2)
Special Requests: ADEQUATE
Special Requests: ADEQUATE

## 2020-03-12 LAB — CBC
HCT: 32.5 % — ABNORMAL LOW (ref 36.0–46.0)
HCT: 26.7 % — ABNORMAL LOW (ref 36.0–46.0)
Hemoglobin: 10.4 g/dL — ABNORMAL LOW (ref 12.0–15.0)
Hemoglobin: 8.5 g/dL — ABNORMAL LOW (ref 12.0–15.0)
MCH: 36.5 pg — ABNORMAL HIGH (ref 26.0–34.0)
MCH: 35.7 pg — ABNORMAL HIGH (ref 26.0–34.0)
MCHC: 32 g/dL (ref 30.0–36.0)
MCHC: 31.8 g/dL (ref 30.0–36.0)
MCV: 114 fL — ABNORMAL HIGH (ref 80.0–100.0)
MCV: 112.2 fL — ABNORMAL HIGH (ref 80.0–100.0)
Platelets: 80 10*3/uL — ABNORMAL LOW (ref 150–400)
Platelets: 48 10*3/uL — ABNORMAL LOW (ref 150–400)
RBC: 2.85 MIL/uL — ABNORMAL LOW (ref 3.87–5.11)
RBC: 2.38 MIL/uL — ABNORMAL LOW (ref 3.87–5.11)
RDW: 16.3 % — ABNORMAL HIGH (ref 11.5–15.5)
RDW: 16.2 % — ABNORMAL HIGH (ref 11.5–15.5)
WBC: 15.3 10*3/uL — ABNORMAL HIGH (ref 4.0–10.5)
WBC: 11 10*3/uL — ABNORMAL HIGH (ref 4.0–10.5)
nRBC: 1 % — ABNORMAL HIGH (ref 0.0–0.2)
nRBC: 1 % — ABNORMAL HIGH (ref 0.0–0.2)

## 2020-03-12 LAB — RENAL FUNCTION PANEL
Albumin: 3 g/dL — ABNORMAL LOW (ref 3.5–5.0)
Anion gap: 14 (ref 5–15)
BUN: 24 mg/dL — ABNORMAL HIGH (ref 8–23)
CO2: 22 mmol/L (ref 22–32)
Calcium: 8.6 mg/dL — ABNORMAL LOW (ref 8.9–10.3)
Chloride: 102 mmol/L (ref 98–111)
Creatinine, Ser: 1.83 mg/dL — ABNORMAL HIGH (ref 0.44–1.00)
GFR calc Af Amer: 32 mL/min — ABNORMAL LOW (ref >60)
GFR calc non Af Amer: 28 mL/min — ABNORMAL LOW (ref >60)
Glucose, Bld: 271 mg/dL — ABNORMAL HIGH (ref 70–99)
Phosphorus: 2.4 mg/dL — ABNORMAL LOW (ref 2.5–4.6)
Potassium: 4.2 mmol/L (ref 3.5–5.1)
Sodium: 138 mmol/L (ref 135–145)

## 2020-03-12 LAB — GLUCOSE, CAPILLARY
Glucose-Capillary: 197 mg/dL — ABNORMAL HIGH (ref 70–99)
Glucose-Capillary: 272 mg/dL — ABNORMAL HIGH (ref 70–99)
Glucose-Capillary: 367 mg/dL — ABNORMAL HIGH (ref 70–99)
Glucose-Capillary: 259 mg/dL — ABNORMAL HIGH (ref 70–99)

## 2020-03-12 LAB — MAGNESIUM: Magnesium: 2.5 mg/dL — ABNORMAL HIGH (ref 1.7–2.4)

## 2020-03-12 LAB — URINE CULTURE

## 2020-03-12 LAB — PROCALCITONIN: Procalcitonin: 6.06 ng/mL

## 2020-03-12 LAB — PROTIME-INR
INR: 2.9 — ABNORMAL HIGH (ref 0.8–1.2)
Prothrombin Time: 29.1 seconds — ABNORMAL HIGH (ref 11.4–15.2)

## 2020-03-12 LAB — APTT
aPTT: 87 seconds — ABNORMAL HIGH (ref 24–36)
aPTT: 45 seconds — ABNORMAL HIGH (ref 24–36)

## 2020-03-12 LAB — FIBRINOGEN: Fibrinogen: 400 mg/dL (ref 210–475)

## 2020-03-12 LAB — TYPE AND SCREEN
ABO/RH(D): A POS
Antibody Screen: NEGATIVE

## 2020-03-12 LAB — HEPARIN LEVEL (UNFRACTIONATED): Heparin Unfractionated: 1.98 IU/mL — ABNORMAL HIGH (ref 0.30–0.70)

## 2020-03-12 MED ORDER — "THROMBI-PAD 3""X3"" EX PADS"
1.0000 | MEDICATED_PAD | Freq: Once | CUTANEOUS | Status: AC
  Administered 2020-03-12: 1 via TOPICAL
  Filled 2020-03-12: qty 1

## 2020-03-12 MED ORDER — SODIUM CHLORIDE 0.9% IV SOLUTION: Freq: Once | INTRAVENOUS | Status: AC

## 2020-03-12 MED ORDER — "THROMBI-PAD 3""X3"" EX PADS"
1.0000 | MEDICATED_PAD | Freq: Once | CUTANEOUS | Status: DC
  Filled 2020-03-12: qty 1

## 2020-03-12 MED ORDER — INSULIN ASPART 100 UNIT/ML ~~LOC~~ SOLN
2.0000 [IU] | Freq: Three times a day (TID) | SUBCUTANEOUS | Status: DC
  Administered 2020-03-12 – 2020-03-15 (×5): 2 [IU] via SUBCUTANEOUS

## 2020-03-12 MED ORDER — METOPROLOL TARTRATE 12.5 MG HALF TABLET
12.5000 mg | ORAL_TABLET | Freq: Two times a day (BID) | ORAL | Status: DC
  Administered 2020-03-12 (×2): 12.5 mg via ORAL
  Filled 2020-03-12 (×2): qty 1

## 2020-03-12 MED ORDER — INSULIN DETEMIR 100 UNIT/ML ~~LOC~~ SOLN
5.0000 [IU] | Freq: Every day | SUBCUTANEOUS | Status: DC
  Administered 2020-03-12 – 2020-03-15 (×4): 5 [IU] via SUBCUTANEOUS
  Filled 2020-03-12 (×4): qty 0.05

## 2020-03-12 NOTE — Progress Notes (Signed)
Inpatient Diabetes Program Recommendations  AACE/ADA: New Consensus Statement on Inpatient Glycemic Control (2015)  Target Ranges:  Prepandial:   less than 140 mg/dL      Peak postprandial:   less than 180 mg/dL (1-2 hours)      Critically ill patients:  140 - 180 mg/dL   Lab Results  Component Value Date   GLUCAP 272 (H) 03/12/2020   HGBA1C 8.0 (H) 03/10/2020    Review of Glycemic Control Results for [name], [name] (MRN [id number]) as of 03/12/2020 12:59  Ref. Range 03/11/2020 15:14 03/11/2020 21:46 03/11/2020 23:53 03/12/2020 07:58 03/12/2020 11:21  Glucose-Capillary Latest Ref Range: 70 - 99 mg/dL 298 (H) 261 (H) 295 (H) 197 (H) 272 (H)   Diabetes history: DM 2 Outpatient Diabetes medications:  SNF Meds: Trulicity 1.5 mg Qweek + Novolog 10 units TID + Novolog 2-12 units TID per SSI Current orders for Inpatient glycemic control:  Novolog moderate tid with meals and HS  Inpatient Diabetes Program Recommendations:     May consider adding Levemir 5 units daily.  Also consider adding Novolog 2 units tid with meals (hold if patient eats less than 50%).    Thanks  Adah Perl, RN, BC-ADM Inpatient Diabetes Coordinator Pager [phone] (8a-5p)

## 2020-03-12 NOTE — Progress Notes (Signed)
[place] for heparin Indication: atrial fibrillation  Assessment: 69 yo f presenting with Afib RVR - on apixaban PTA. Apixaban held per cards and pharmacy dosing heparin.  -aPTT= 87 sec  Hg 10.4, PTLC 80  Bleeding noted from central line  Goal of Therapy:  Heparin level 0.3-0.7 units/ml aPTT 66 - 102 seconds Monitor platelets by anticoagulation protocol: Yes   Plan:  Decrease heparin to 700 units/hr Check aptt 6-8 hours after rate change  Thanks for allowing pharmacy to be a part of this patient's care.  Excell Seltzer, PharmD Clinical Pharmacist

## 2020-03-12 NOTE — Progress Notes (Signed)
Subjective: No further events.   Exam: Vitals:   03/12/20 0845 03/12/20 0900  BP: (!) 147/103 (!) 121/91  Pulse:    Resp: (!) 29 21  Temp:    SpO2:  100%   Gen: In bed, NAD Resp: non-labored breathing, no acute distress Abd: soft, nt  Neuro: MS: awake, alert, interactive and appropriate LO:PRAFO, EOMI, VFF, face symmetric Motor: able to hold arms against gravity, wiggles toes to command(aparatus is set up preventing more detailed strength testing in the legs) Sensory:intact to LT  Pertinent Labs: Creatinine 1.8  Impression: 69 yo F with episode of AMS of unclear etiology, partial seizure, psychiatric cause, TIA are possible. She is on anticoagulation for afib, would continue this. Would not make any AED changes unless EEG is fairly active.   Recommendations: 1) discontinue home tramadol.  2) Continue home primidone and keppra.  3) Will f/u EEG.   Roland Rack, MD [facility] [phone]  If 7pm- 7am, please page neurology on call as listed in [place].

## 2020-03-12 NOTE — Progress Notes (Signed)
PCCM Interval Progress Note  Asked to evaluate R [place] CVL site due to persistent bleeding since line was inserted.  Multiple thrombi pads and pressure dressings placed without success in stopping bleeding.  Single horizontal mattress suture placed around line insertion site.  Bleeding appears to be controlled for now.  Nursing staff can use additional thrombi pads if needed.   [name], [facility] 03/12/2020, 6:26 AM

## 2020-03-12 NOTE — Procedures (Signed)
Admit: 03/10/2020 LOS: 2  39F ESRD with septic shock, AFib/RVR, hyperkalemia, enterococcal bacteremia  Current CRRT Prescription: Start Date: 03/10/20 Catheter: L IJ TDC BFR: 200 Pre Blood Pump: 400 4K DFR: 1500 4K Replacement Rate: 300 4K Goal UF: 69mL/h net neg Anticoagulation: systemic heparin Clotting: none K 4.2, P 2.4   S:  ? Seizure overnight, EEG now  Remains on PE  Tolerating CRRT, and frequent clotting  On linezolid  AVG and right arm with bruit and thrill  O: 07/27 0701 - 07/28 0700 In: 1966.9 [P.O.:960; I.V.:1006.9] Out: 2407 [Urine:3; Stool:1]  Filed Weights   03/10/20 1800 03/11/20 0500 03/12/20 0500  Weight: 85.5 kg 87.6 kg 86.9 kg    Recent Labs  Lab 03/11/20 0335 03/11/20 1517 03/12/20 0301  NA 140 135 138  K 3.8 3.8 4.2  CL 105 102 102  CO2 21* 21* 22  GLUCOSE 111* 320* 271*  BUN 36* 25* 24*  CREATININE 2.61* 1.96* 1.83*  CALCIUM 8.3* 8.3* 8.6*  PHOS 3.2 2.9 2.4*   Recent Labs  Lab 03/07/20 0044 03/07/20 0044 03/10/20 1032 03/10/20 1048 03/10/20 1050 03/11/20 0335 03/12/20 0300  WBC 9.3   < > 16.9*  --   --  20.9* 15.3*  NEUTROABS 7.0  --   --   --   --   --   --   HGB 9.8*   < > 11.2*   < > 11.9* 11.2* 10.4*  HCT 29.7*   < > 35.9*   < > 35.0* 35.4* 32.5*  MCV 110.8*   < > 114.7*  --   --  115.3* 114.0*  PLT 169   < > 138*  --   --  171 80*   < > = values in this interval not displayed.    Scheduled Meds: . ARIPiprazole  2 mg Oral Daily  . aspirin EC  81 mg Oral Q breakfast  . atorvastatin  20 mg Oral QHS  . calcitRIOL  0.25 mcg Oral Q M,W,F  . calcium acetate  2,001 mg Oral TID WC  . Chlorhexidine Gluconate Cloth  6 each Topical Daily  . feeding supplement (ENSURE ENLIVE)  237 mL Oral BID BM  . insulin aspart  0-15 Units Subcutaneous TID WC  . insulin aspart  0-5 Units Subcutaneous QHS  . levETIRAcetam  250 mg Oral BID  . levothyroxine  50 mcg Oral QAC breakfast  . linezolid  600 mg Oral Q12H  . mouth rinse  15 mL  Mouth Rinse BID  . metoprolol tartrate  12.5 mg Oral BID  . montelukast  10 mg Oral QHS  . multivitamin  1 tablet Oral QHS  . pantoprazole  40 mg Oral Daily  . primidone  50 mg Oral Daily  . sodium chloride flush  10-40 mL Intracatheter Q12H   Continuous Infusions: .  prismasol BGK 4/2.5 400 mL/hr at 03/12/20 0832  .  prismasol BGK 4/2.5 300 mL/hr at 03/11/20 1203  . sodium chloride    . amiodarone 30 mg/hr (03/12/20 1100)  . heparin 700 Units/hr (03/12/20 1100)  . phenylephrine (NEO-SYNEPHRINE) Adult infusion Stopped (03/12/20 0916)  . prismasol BGK 4/2.5 1,500 mL/hr at 03/12/20 1007   PRN Meds:.acetaminophen, albuterol, calcium carbonate, docusate sodium, heparin, nitroGLYCERIN, ondansetron (ZOFRAN) IV, sodium chloride flush  ABG    Component Value Date/Time   PHART 7.376 03/10/2020 1719   PCO2ART 41.1 03/10/2020 1719   PO2ART 93.2 03/10/2020 1719   HCO3 23.9 03/10/2020 1719   TCO2 28  03/10/2020 1050   ACIDBASEDEF 0.8 03/10/2020 1719   O2SAT 96.8 03/10/2020 1719    A/P  1. ESRD on CRRT L IJ TDC 2. Septic shock 2/2 enterococcal bacteremia, on PE; not VRE 3.  RUE AVG placed 7/7, seems mature and usable 4. AFib with RVR, amio + systemic heparin 5. HfPEF 6. Hypoxic RF on [place] 7. AMS improved 8. DM2 9. RA on steroids  Continue CRRT while on pressors, if able to wean pressors off would stop CRRT today and transition to IHD to facilitate removal of tunneled HD catheter.  Should be able to use AVG.  Pearson Grippe, MD [facility]

## 2020-03-12 NOTE — Progress Notes (Signed)
  Speech Language Pathology Treatment: Dysphagia  Patient Details Name: [name] MRN: [id number] DOB: 10/28/1950 Today's Date: 03/12/2020 Time: 7322-0254 SLP Time Calculation (min) (ACUTE ONLY): 12 min  Assessment / Plan / Recommendation Clinical Impression  Pt was seen for dysphagia treatment. Pt and nursing reported that she has been consuming the current diet without overt s/sx of aspiration. Pt exhibited coughing with 3/8 boluses of thin liquids via cup, suggesting aspiration. Pt indicated that this may be as frequently as she coughs with meals when at home. She stated that she has been using a chin tuck posture, but this was not consistently demonstrated during the session. Coughing appeared to coincide with when she consumed larger boluses with her head in a neutral position. It is recommended that her current diet be continued. SLP will follow to determine if a repeat instrumental assessment is warranted if her symptoms to not improve.   HPI HPI: 69 y.o. female, known to SLP services, with medical history significant for acute-on-chronic dysphagia, chronic hypoxemic respiratory failure secondary to underlying COPD-on 3 L of oxygen via nasal cannula, paroxysmal A. fib-on Eliquis, ESRD on hemodialysis (TTS), diastolic CHF, moderate AS, hypertension, hyperlipidemia, diabetes mellitus, obstructive sleep apnea/OHS, rheumatoid arthritis, seizure disorder, depression/anxiety, bipolar disorder presented to emergency department 7/26 with SOB and elevated blood glucose. Developed resp failure, transferred to ICU, on BIPAP, CRRT initiated; Dx sepsis secondary to RLL pna, afib with RVR, acute metabolic encephalopathy.  Pt has been followed by SLP during multiple admissions the last several years, with acute decompensations of swallow function.  She has had MBS studies, some of which showed deep penetration and/or aspiration of thin liquids, chin tuck being effective at one time.       SLP Plan   Continue with current plan of care       Recommendations  Diet recommendations: Regular;Nectar-thick liquid Liquids provided via: Cup Medication Administration: Whole meds with puree Supervision: Staff to assist with self feeding Compensations: Slow rate;Small sips/bites Postural Changes and/or Swallow Maneuvers: Seated upright 90 degrees                Oral Care Recommendations: Oral care BID Follow up Recommendations: Other (comment) (tba) SLP Visit Diagnosis: Dysphagia, oropharyngeal phase (R13.12) Plan: Continue with current plan of care       Shanika I. Hardin Negus, [place], [place] Office number [phone] Pager [place] 03/12/2020, 10:00 AM

## 2020-03-12 NOTE — Progress Notes (Signed)
Patient's right chest CVC has been oozing/leaking blood since insertion on 03/10/20. Saturated dressing removed. Manual pressure held for 10 minutes. Thrombi-pad and gauze pressure dressing applied. No antimicrobial disc placed due to using thrombi-pad. Dressing to be changed tomorrow AM per gauze dressing protocol. Will monitor closely.  Joellen Jersey, RN

## 2020-03-12 NOTE — Progress Notes (Addendum)
NAME:  [name], MRN:  [id number], DOB:  [birth year], LOS: 2 ADMISSION DATE:  03/10/2020, CONSULTATION DATE:  7/26 REFERRING MD:  [email or web address], CHIEF COMPLAINT:  afib RVR   Brief History   64 yof admitted to [facility] on 03/10/20 for afib with RVR, decompensated heart failure and septic shock. She developed progressive hypotension and PCCM was consulted for admission to ICU for pressor support.  History of present illness   46 yof with numerous comorbidities . Presented from SNF for atrial fibrillation with HR in the 170s.  Started on diltiazem drip by EMS however upon arrival to the ED, HR remained elevated and diltiazem drip was increased. HR improved with increased rate however she developed progressive hypotension and was transitioned over to amiodarone. Due to continued hypotension, PCCM was consulted for transfer to ICU for pressor support. Chronic hypoxic resp failure on 2L at home Additionally, developed respiratory failure. Workup in the ED revealed an elevated white count and CXR concerning for pna.    Past Medical History  HFpEF, persistent atrial fibrillation, T2DM, RA, hypothyroidism, COPD, ESRD on HD, seizure disorder  Significant Hospital Events   7/26 admission 7/26 transferred to ICU, CRRT initiated, on Neo-Synephrine  Consults:  PCCM Cardiology Nephrology Infectious disease 7/27 Neurology 7/27  Procedures:  RT [place] CVL 7/26 >> Perm cath L 09/27/19 Significant Diagnostic Tests:  7/23 cth: 1. No evidence of acute intracranial abnormality. 2. Mild chronic small vessel ischemic disease. 7/26 abdominal u/s: Status post cholecystectomy. Fatty infiltration of the liver. No acute abnormality in the upper abdomen is seen. Incidental note of right pleural effusion. 7/27echo: LVEF 45-50%, RV with mildly reduced function RVSP 53, biatrial enlargement, TR mod to severe 7/27 cth: 1. Stable and normal for age non contrast CT appearance of the brain. ASPECTS 10.  Micro Data:    7/26 blood cultures>> enterococcus faecalis 7/27 urine: multiple species Antimicrobials:  Vancomycin 7/26>>7/27 linezolid 7/27-> Cefepime 7/26>>  Interim history/subjective:   7/28: Appears much better. Conversant and appropriate Remains on Neo-Synephrine but titrating. If unable to come off today will increase steroids On CRRT until off pressors.  Recheck INR   Objective   Blood pressure 111/80, pulse 100, temperature (!) 97.4 F (36.3 C), resp. rate 23, weight 86.9 kg, SpO2 99 %. CVP:  [19 mmHg-20 mmHg] 19 mmHg  Vent Mode: BIPAP;PCV Set Rate:  [12 bmp] 12 bmp   Intake/Output Summary (Last 24 hours) at 03/12/2020 0817 Last data filed at 03/12/2020 0800 Gross per 24 hour  Intake 1943.64 ml  Output 2516 ml  Net -572.36 ml   Filed Weights   03/10/20 1800 03/11/20 0500 03/12/20 0500  Weight: 85.5 kg 87.6 kg 86.9 kg    Examination: General: acutely ill appearing woman, no distress HENT: Mild pallor, no icterus Lungs: No accessory muscle use, decreased breath sounds on right Cardiovascular: S1-S2 tacky, regular extremities cold to touch. +2 edema in the lower extremities.  Central line site-oozing blood Abdomen: soft.,  Suprapubic tenderness Extremities: Cool no edema Neuro: Alert, interactive, nonfocal Skin: no rash  Lab Results  Component Value Date   WBC 15.3 (H) 03/12/2020   HGB 10.4 (L) 03/12/2020   HCT 32.5 (L) 03/12/2020   MCV 114.0 (H) 03/12/2020   PLT 80 (L) 03/12/2020   Lab Results  Component Value Date   CREATININE 1.83 (H) 03/12/2020   BUN 24 (H) 03/12/2020   NA 138 03/12/2020   K 4.2 03/12/2020   CL 102 03/12/2020   CO2 22 03/12/2020  Resolved Hospital Problem list   n/a  Assessment & Plan:   Septic shock due to Enterococcus suspected urinary source with history of vre in urine Plan -abx per ID -await sensitivity -We will likely need to discontinue permacath once off crrt (currently on pressors) - TEE to rule out vegetation when  stable for such -Neo-Synephrine drip being titrated for map >65 -will increase home prednisone from 10mg  daily if not off pressors by this afternoon. Cortisol >40 but with chronic prednisone use can be falsely elevated.   Bleeding subclavian line:  -cont to ooze -once off pressors will pull line after heparin held.  -challenging to get to stop as pt was on eliquis prior to insertion and unable to compress vessel.  -inr was and remains >2 and now plts are 80K -cont with thrombi pads. Would not place further sutures -discussed with RN.  -benefit of heparin (with pt in afib) outweighs risk at this time.   .  Acute decompensation HFpEF:  -cont volume removal as tolerated with crrt -monitor I/O and wts.  Persistent Atrial Fibrillation with RVR. On eliquis at home. HR in the 170s on presentation.  Drop blood pressure with Cardizem, better controlled on amiodarone Bifasicular block Troponinemia likely attributable to type II NSTEMI Plan:  -continue amiodarone drip -heparin IV for anticoagulation instead of Eliquis -tele   Acute on chronic hypoxic respiratory failure 2/2 pulm edema vs pneumonia with chronic right pleural effusion. Currently only on 3L supplemental oxygen but with abdominal breathing -req 2L continuous at home per pt Chronic right pleural effusion. Previously noted on imaging so do not suspect this to be the source of respiratory failure and is likely 2/2 heart failure /ESRD. COPD Plan: Tolerating off BiPAP Consider thoracentesis only if no response to antibiotics   Acute toxic/metabolic encephalopathy.  Plan: Improved as blood pressure improved -however does have somewhat elevated ammonia -cont lactulose  ESRD on HD. -Continue CRRT per renal -May have to consider removal of permacath  Transaminitis. AST 432; ALT 330. Suspect to be attributable to liver shock with hypotension/heart failure.  Plan:  RUQ [place] >> no evidence of obstructive jaundice Continue to montitor.  Repeat cmp in am -Avoid hepatotoxic drugs. Elevated ammonia:  -lactulose  T2DM with hyperglycemia. continue SSI and adjust accordingly to CBGs  Hypothyroidism. Continue synthroid  Rheumatoid arthritis: continue prednisone 10mg  daily.  Seizure disorder: continue keppra  Best practice:  Diet: NPO Pain/Anxiety/Delirium protocol (if indicated): n/a VAP protocol (if indicated): n/a DVT prophylaxis: heparin IV GI prophylaxis: ppi Glucose control: SSI Mobility: BR Code Status: Full Family Communication: Patient, she designates her niece as [facility] POA Disposition: ICU  Critical care time: The patient is critically ill with multiple organ systems failure and requires high complexity decision making for assessment and support, frequent evaluation and titration of therapies, application of advanced monitoring technologies and extensive interpretation of multiple databases.  Critical care time 35 mins. This represents my time independent of the NPs time taking care of the pt. This is excluding procedures.    [facility] 03/12/2020, 8:17 AM

## 2020-03-12 NOTE — Progress Notes (Signed)
[place] for heparin Indication: atrial fibrillation  Assessment: 69 yo f presenting with Afib RVR - on apixaban PTA. Apixaban held per cards and pharmacy dosing heparin.  -aPTT= 87 sec  Hg 10.4, PTLC 80  Bleeding noted from central line continues even after stitch placed overnight. Primary team feels benefit outweigh risks and will restart heparin at lower rate this am. Thrombi pad being placed over CVL site.   Goal of Therapy:  Heparin level 0.3-0.7 units/ml aPTT 66 - 102 seconds Monitor platelets by anticoagulation protocol: Yes   Plan:  Restart heparin at 700 units/hr Check aptt 8 hours after rate change  Thanks for allowing pharmacy to be a part of this patient's care.  Erin Hearing PharmD., BCPS Clinical Pharmacist 03/12/2020 9:11 AM

## 2020-03-12 NOTE — Progress Notes (Signed)
At around 2130 patient asked to use the bed pan and nurse told her she will be back to help her. This RN walks back into patients room at 2140 and patient did not respond to commands, could not speak, had a focal gaze whist turning her head side to side.  Patient could not move her upper and lower extremities and did not respond to any stimuli or her name. Corneal reflexes were present as well as PERRLA present.  Patient was on heparin drip which was stopped per Dr. Genevive Bi (CCM).  Code stroke was activated immediately and patient sent down for STAT CT scan per Neurology. Dr. Rory Percy met this patient and RN down at CT and examined her. See MAR for new orders completed.  After CT scan patient started moving all extremities and speaking again. She was alert and oriented X 4 and followed commands. Patient is back to baseline. Will continue to monitor

## 2020-03-12 NOTE — Progress Notes (Signed)
EEG complete - results pending 

## 2020-03-12 NOTE — Progress Notes (Signed)
[facility]  Date of Admission:  03/10/2020     Total days of antibiotics 3         ASSESSMENT:  [name] had an episode of catatonia vs seizure last evening with stroke being less likely.  Cultures have identified enterococcus faecalis with pan sensitivity. Will change the Zyvox to vancomycin that can be give with dialysis. Will need TEE to rule out endocarditis when stable. Plan is to stop CRRT in the next 24-48 hours. Previously had allergic reaction to penicillin in grade school and will plan for amoxicillin challenge per pharmacy protocol.  Neurology following for possible seizure.  PLAN:  1. Change linezolid to vancomycin with dialysis.  2. Amoxicillin challenge.  3. TEE when stable.  4. Repeat blood cultures to ensure clearance of bacteremia. 5. Seizure management per neurology as indicated  6. CRRT per primary team and nephrology.   Principal Problem:   Bacteremia due to Enterococcus Active Problems:   Type 2 diabetes mellitus with diabetic polyneuropathy, without long-term current use of insulin (HCC)   Diabetic peripheral neuropathy (HCC)   HYPERCHOLESTEROLEMIA   Gout, unspecified   Schizophrenia ([name])   Essential hypertension   Acute on chronic diastolic CHF (congestive heart failure) (HCC)   Hypothyroidism   ESRD (end stage renal disease) (HCC)   Elevated troponin   Acute on chronic respiratory failure with hypoxia (HCC)   Atrial fibrillation with RVR (HCC)   Macrocytic anemia   Thrombocytopenia (HCC)   Hyperkalemia   Septic shock (HCC)   Elevated liver enzymes   . ARIPiprazole  2 mg Oral Daily  . aspirin EC  81 mg Oral Q breakfast  . atorvastatin  20 mg Oral QHS  . calcitRIOL  0.25 mcg Oral Q M,W,F  . calcium acetate  2,001 mg Oral TID WC  . Chlorhexidine Gluconate Cloth  6 each Topical Daily  . feeding supplement (ENSURE ENLIVE)  237 mL Oral BID BM  . insulin aspart  0-15 Units Subcutaneous TID WC  . insulin aspart  0-5  Units Subcutaneous QHS  . levETIRAcetam  250 mg Oral BID  . levothyroxine  50 mcg Oral QAC breakfast  . linezolid  600 mg Oral Q12H  . mouth rinse  15 mL Mouth Rinse BID  . montelukast  10 mg Oral QHS  . multivitamin  1 tablet Oral QHS  . pantoprazole  40 mg Oral Daily  . predniSONE  10 mg Oral Daily  . primidone  50 mg Oral Daily  . sodium chloride flush  10-40 mL Intracatheter Q12H  . Thrombi-Pad  1 each Topical Once    SUBJECTIVE:  Afebrile overnight with improving leukocytosis. Experienced episode of catatonia vs seizure evaluated by neurology. Feeling okay today. Last time she had penicillin was when she was school aged and has swelling and whelps.   Allergies  Allergen Reactions  . Benztropine Mesylate Other (See Comments)    Alopecia and white spots on eyes  . Codeine Swelling, Palpitations and Other (See Comments)    Mouth Swelling and Tachycardia   . Diazepam Other (See Comments)    COMA  . Diphenhydramine Hcl Anxiety, Palpitations and Other (See Comments)    Tachycardia and anxiousness  . Penicillins Swelling  . Sulfonamide Derivatives Rash    Blisters, also  . Thioridazine Hcl Swelling  . Bee Venom     Documented on MAR  . Lisinopril Cough     Review of Systems: Review of Systems  Constitutional: Negative  for chills, fever and weight loss.  Respiratory: Negative for cough, shortness of breath and wheezing.   Cardiovascular: Negative for chest pain and leg swelling.  Gastrointestinal: Negative for abdominal pain, constipation, diarrhea, nausea and vomiting.  Skin: Negative for rash.     OBJECTIVE: Vitals:   03/12/20 0815 03/12/20 0830 03/12/20 0845 03/12/20 0900  BP: 128/77 (!) 131/88 (!) 147/103 (!) 121/91  Pulse:  (!) 27    Resp: 20 19 (!) 29 21  Temp:      TempSrc:      SpO2:  90%  100%  Weight:       Body mass index is 35.04 kg/m.  Physical Exam Constitutional:      General: She is not in acute distress.    Appearance: She is  well-developed. She is obese.     Comments: Lying in bed with head of bed elevated; pleasant.   Cardiovascular:     Rate and Rhythm: Rhythm irregularly irregular.     Heart sounds: Normal heart sounds.  Pulmonary:     Effort: Pulmonary effort is normal.     Breath sounds: Normal breath sounds.  Skin:    General: Skin is warm and dry.  Neurological:     Mental Status: She is alert.  Psychiatric:        Mood and Affect: Mood normal.     Lab Results Lab Results  Component Value Date   WBC 15.3 (H) 03/12/2020   HGB 10.4 (L) 03/12/2020   HCT 32.5 (L) 03/12/2020   MCV 114.0 (H) 03/12/2020   PLT 80 (L) 03/12/2020    Lab Results  Component Value Date   CREATININE 1.83 (H) 03/12/2020   BUN 24 (H) 03/12/2020   NA 138 03/12/2020   K 4.2 03/12/2020   CL 102 03/12/2020   CO2 22 03/12/2020    Lab Results  Component Value Date   ALT 330 (H) 03/10/2020   AST 432 (H) 03/10/2020   ALKPHOS 99 03/10/2020   BILITOT 1.5 (H) 03/10/2020     Microbiology: Recent Results (from the past 240 hour(s))  SARS Coronavirus 2 by RT PCR (hospital order, performed in [facility] hospital lab) Nasopharyngeal Nasopharyngeal Swab     Status: None   Collection Time: 03/07/20  1:13 AM   Specimen: Nasopharyngeal Swab  Result Value Ref Range Status   SARS Coronavirus 2 NEGATIVE NEGATIVE Final    Comment: (NOTE) SARS-CoV-2 target nucleic acids are NOT DETECTED.  The SARS-CoV-2 RNA is generally detectable in upper and lower respiratory specimens during the acute phase of infection. The lowest concentration of SARS-CoV-2 viral copies this assay can detect is 250 copies / mL. A negative result does not preclude SARS-CoV-2 infection and should not be used as the sole basis for treatment or other patient management decisions.  A negative result may occur with improper specimen collection / handling, submission of specimen other than nasopharyngeal swab, presence of viral mutation(s) within the areas  targeted by this assay, and inadequate number of viral copies (<250 copies / mL). A negative result must be combined with clinical observations, patient history, and epidemiological information.  Fact Sheet for Patients:   StrictlyIdeas.no  Fact Sheet for Healthcare Providers: BankingDealers.co.za  This test is not yet approved or  cleared by the [place] FDA and has been authorized for detection and/or diagnosis of SARS-CoV-2 by FDA under an Emergency Use Authorization (EUA).  This EUA will remain in effect (meaning this test can be used) for the duration  of the COVID-19 declaration under Section 564(b)(1) of the Act, 21 U.S.C. section 360bbb-3(b)(1), unless the authorization is terminated or revoked sooner.  Performed at [facility], [address] [place], [place] [address]   SARS Coronavirus 2 by RT PCR (hospital order, performed in [facility] hospital lab) Nasopharyngeal Nasopharyngeal Swab     Status: None   Collection Time: 03/10/20 10:32 AM   Specimen: Nasopharyngeal Swab  Result Value Ref Range Status   SARS Coronavirus 2 NEGATIVE NEGATIVE Final    Comment: (NOTE) SARS-CoV-2 target nucleic acids are NOT DETECTED.  The SARS-CoV-2 RNA is generally detectable in upper and lower respiratory specimens during the acute phase of infection. The lowest concentration of SARS-CoV-2 viral copies this assay can detect is 250 copies / mL. A negative result does not preclude SARS-CoV-2 infection and should not be used as the sole basis for treatment or other patient management decisions.  A negative result may occur with improper specimen collection / handling, submission of specimen other than nasopharyngeal swab, presence of viral mutation(s) within the areas targeted by this assay, and inadequate number of viral copies (<250 copies / mL). A negative result must be combined with clinical observations, patient history, and  epidemiological information.  Fact Sheet for Patients:   StrictlyIdeas.no  Fact Sheet for Healthcare Providers: BankingDealers.co.za  This test is not yet approved or  cleared by the [place] FDA and has been authorized for detection and/or diagnosis of SARS-CoV-2 by FDA under an Emergency Use Authorization (EUA).  This EUA will remain in effect (meaning this test can be used) for the duration of the COVID-19 declaration under Section 564(b)(1) of the Act, 21 U.S.C. section 360bbb-3(b)(1), unless the authorization is terminated or revoked sooner.  Performed at [facility], [place] [address] [place], [place] [address]   Blood Culture (routine x 2)     Status: Abnormal   Collection Time: 03/10/20 11:12 AM   Specimen: BLOOD  Result Value Ref Range Status   Specimen Description BLOOD LEFT ANTECUBITAL  Final   Special Requests   Final    BOTTLES DRAWN AEROBIC AND ANAEROBIC Blood Culture adequate volume   Culture  Setup Time   Final    GRAM POSITIVE COCCI IN CHAINS IN BOTH AEROBIC AND ANAEROBIC BOTTLES Organism ID to follow CRITICAL RESULT CALLED TO, READ BACK BY AND VERIFIED WITH: PHARMD VERANDA BRICK AT [place] ON 03/11/2020 Performed at [facility], [place] [address], [place], [place] [address]    Culture ENTEROCOCCUS FAECALIS (A)  Final   Report Status 03/12/2020 FINAL  Final   Organism ID, Bacteria ENTEROCOCCUS FAECALIS  Final      Susceptibility   Enterococcus faecalis - MIC*    AMPICILLIN <=2 SENSITIVE Sensitive     VANCOMYCIN 1 SENSITIVE Sensitive     GENTAMICIN SYNERGY SENSITIVE Sensitive     * ENTEROCOCCUS FAECALIS  Blood Culture ID Panel (Reflexed)     Status: Abnormal   Collection Time: 03/10/20 11:12 AM  Result Value Ref Range Status   Enterococcus species DETECTED (A) NOT DETECTED Final    Comment: CRITICAL RESULT CALLED TO, READ BACK BY AND VERIFIED WITH: PHARMD VERANDA BRICK AT 0052  BY MESSAN HOUEGNIFIO ON 03/11/2020    Vancomycin resistance NOT DETECTED NOT DETECTED Final   Listeria monocytogenes NOT DETECTED NOT DETECTED Final   Staphylococcus species NOT DETECTED NOT DETECTED Final   Staphylococcus aureus (BCID) NOT DETECTED NOT DETECTED Final   Streptococcus species NOT  DETECTED NOT DETECTED Final   Streptococcus agalactiae NOT DETECTED NOT DETECTED Final   Streptococcus pneumoniae NOT DETECTED NOT DETECTED Final   Streptococcus pyogenes NOT DETECTED NOT DETECTED Final   Acinetobacter baumannii NOT DETECTED NOT DETECTED Final   Enterobacteriaceae species NOT DETECTED NOT DETECTED Final   Enterobacter cloacae complex NOT DETECTED NOT DETECTED Final   Escherichia coli NOT DETECTED NOT DETECTED Final   Klebsiella oxytoca NOT DETECTED NOT DETECTED Final   Klebsiella pneumoniae NOT DETECTED NOT DETECTED Final   Proteus species NOT DETECTED NOT DETECTED Final   Serratia marcescens NOT DETECTED NOT DETECTED Final   Haemophilus influenzae NOT DETECTED NOT DETECTED Final   Neisseria meningitidis NOT DETECTED NOT DETECTED Final   Pseudomonas aeruginosa NOT DETECTED NOT DETECTED Final   Candida albicans NOT DETECTED NOT DETECTED Final   Candida glabrata NOT DETECTED NOT DETECTED Final   Candida krusei NOT DETECTED NOT DETECTED Final   Candida parapsilosis NOT DETECTED NOT DETECTED Final   Candida tropicalis NOT DETECTED NOT DETECTED Final    Comment: Performed at [facility], [place] [address] [place], [place] [address]  Blood Culture (routine x 2)     Status: Abnormal   Collection Time: 03/10/20 11:35 AM   Specimen: BLOOD LEFT FOREARM  Result Value Ref Range Status   Specimen Description BLOOD LEFT FOREARM  Final   Special Requests   Final    BOTTLES DRAWN AEROBIC AND ANAEROBIC Blood Culture adequate volume   Culture  Setup Time   Final    GRAM POSITIVE COCCI IN CHAINS IN BOTH AEROBIC AND ANAEROBIC BOTTLES CRITICAL VALUE NOTED.  VALUE IS CONSISTENT WITH  PREVIOUSLY REPORTED AND CALLED VALUE.    Culture (A)  Final    ENTEROCOCCUS FAECALIS SUSCEPTIBILITIES PERFORMED ON PREVIOUS CULTURE WITHIN THE LAST 5 DAYS. Performed at [facility], [place] [address] [place], [place] [address]    Report Status 03/12/2020 FINAL  Final  MRSA PCR Screening     Status: Abnormal   Collection Time: 03/10/20  6:08 PM   Specimen: Nasopharyngeal  Result Value Ref Range Status   MRSA by PCR (A) NEGATIVE Final    INVALID, UNABLE TO DETERMINE THE PRESENCE OF TARGET DUE TO SPECIMEN INTEGRITY. RECOLLECTION REQUESTED.    Comment: A,KOTEY @[id number]  03/10/20 EB Performed at [place] [address], [place], [place] [address]   MRSA PCR Screening     Status: None   Collection Time: 03/11/20  8:17 AM   Specimen: Nasal Mucosa; Nasopharyngeal  Result Value Ref Range Status   MRSA by PCR NEGATIVE NEGATIVE Final    Comment:        The GeneXpert MRSA Assay (FDA approved for NASAL specimens only), is one component of a comprehensive MRSA colonization surveillance program. It is not intended to diagnose MRSA infection nor to guide or monitor treatment for MRSA infections. Performed at [facility], [place] [address], [place], [place] [address]   Urine culture     Status: Abnormal   Collection Time: 03/11/20  9:17 AM   Specimen: In/Out Cath Urine  Result Value Ref Range Status   Specimen Description IN/OUT CATH URINE  Final   Special Requests   Final    NONE Performed at [facility], [place] [address] [place],  [address]    Culture MULTIPLE SPECIES PRESENT, SUGGEST RECOLLECTION (A)  Final   Report Status 03/12/2020 FINAL  Final     Terri Piedra, NP [facility]  03/12/2020  9:16 AM

## 2020-03-12 NOTE — Progress Notes (Signed)
Progress Note  Patient Name: [name] Date of Encounter: 03/12/2020  [facility] Cardiologist: Rozann Lesches, MD   Subjective   Patient more alert and communicative off of BiPAP this morning on 3 L nasal prong with saturations in the high 90s.  She feels clinically improved and seems to be more alert.  She denies chest pain or shortness of breath.  Inpatient Medications    Scheduled Meds: . ARIPiprazole  2 mg Oral Daily  . aspirin EC  81 mg Oral Q breakfast  . atorvastatin  20 mg Oral QHS  . calcitRIOL  0.25 mcg Oral Q M,W,F  . calcium acetate  2,001 mg Oral TID WC  . Chlorhexidine Gluconate Cloth  6 each Topical Daily  . feeding supplement (ENSURE ENLIVE)  237 mL Oral BID BM  . insulin aspart  0-15 Units Subcutaneous TID WC  . insulin aspart  0-5 Units Subcutaneous QHS  . levETIRAcetam  250 mg Oral BID  . levothyroxine  50 mcg Oral QAC breakfast  . linezolid  600 mg Oral Q12H  . mouth rinse  15 mL Mouth Rinse BID  . montelukast  10 mg Oral QHS  . multivitamin  1 tablet Oral QHS  . pantoprazole  40 mg Oral Daily  . predniSONE  10 mg Oral Daily  . primidone  50 mg Oral Daily  . sodium chloride flush  10-40 mL Intracatheter Q12H   Continuous Infusions: .  prismasol BGK 4/2.5 400 mL/hr at 03/12/20 0832  .  prismasol BGK 4/2.5 300 mL/hr at 03/11/20 1203  . sodium chloride    . amiodarone 30 mg/hr (03/12/20 0900)  . heparin Stopped (03/12/20 0530)  . phenylephrine (NEO-SYNEPHRINE) Adult infusion 20 mcg/min (03/12/20 0900)  . prismasol BGK 4/2.5 1,500 mL/hr at 03/12/20 2130   PRN Meds: acetaminophen, albuterol, calcium carbonate, docusate sodium, heparin, nitroGLYCERIN, ondansetron (ZOFRAN) IV, sodium chloride flush   Vital Signs    Vitals:   03/12/20 0815 03/12/20 0830 03/12/20 0845 03/12/20 0900  BP: 128/77 (!) 131/88 (!) 147/103 (!) 121/91  Pulse:  (!) 27    Resp: 20 19 (!) 29 21  Temp:      TempSrc:      SpO2:  90%  100%  Weight:         Intake/Output Summary (Last 24 hours) at 03/12/2020 0936 Last data filed at 03/12/2020 0900 Gross per 24 hour  Intake 1671.59 ml  Output 2552 ml  Net -880.41 ml   Last 3 Weights 03/12/2020 03/11/2020 03/10/2020  Weight (lbs) 191 lb 9.3 oz 193 lb 2 oz 188 lb 7.9 oz  Weight (kg) 86.9 kg 87.6 kg 85.5 kg      Telemetry    A. fib with RVR, rates in the 110-120  Personally Reviewed  ECG    Not performed today- Personally Reviewed  Physical Exam   GEN: No acute distress.   Neck: No JVD Cardiac:  Irregularly irregular, tachycardic Respiratory: Clear to auscultation bilaterally. GI: Soft, nontender, non-distended  MS: No edema; No deformity. Neuro:  Nonfocal  Psych: Were communicative this morning  Labs    High Sensitivity Troponin:   Recent Labs  Lab 03/10/20 1032 03/10/20 1323  TROPONINIHS 42* 56*      Chemistry Recent Labs  Lab 03/10/20 1112 03/10/20 1112 03/11/20 0335 03/11/20 1517 03/12/20 0301  NA  --    < > 140 135 138  K  --    < > 3.8 3.8 4.2  CL  --    < >  105 102 102  CO2  --    < > 21* 21* 22  GLUCOSE  --    < > 111* 320* 271*  BUN  --    < > 36* 25* 24*  CREATININE  --    < > 2.61* 1.96* 1.83*  CALCIUM  --    < > 8.3* 8.3* 8.6*  PROT 5.0*  --   --   --   --   ALBUMIN 3.1*   < > 2.9* 2.8* 3.0*  AST 432*  --   --   --   --   ALT 330*  --   --   --   --   ALKPHOS 99  --   --   --   --   BILITOT 1.5*  --   --   --   --   GFRNONAA  --    < > 18* 26* 28*  GFRAA  --    < > 21* 30* 32*  ANIONGAP  --    < > 14 12 14    < > = values in this interval not displayed.     Hematology Recent Labs  Lab 03/10/20 1032 03/10/20 1048 03/10/20 1050 03/11/20 0335 03/12/20 0300  WBC 16.9*  --   --  20.9* 15.3*  RBC 3.13*  --   --  3.07* 2.85*  HGB 11.2*   < > 11.9* 11.2* 10.4*  HCT 35.9*   < > 35.0* 35.4* 32.5*  MCV 114.7*  --   --  115.3* 114.0*  MCH 35.8*  --   --  36.5* 36.5*  MCHC 31.2  --   --  31.6 32.0  RDW 16.2*  --   --  16.5* 16.3*  PLT 138*   --   --  171 80*   < > = values in this interval not displayed.    BNP Recent Labs  Lab 03/10/20 1032  BNP 1,486.7*     DDimer No results for input(s): DDIMER in the last 168 hours.   Radiology    DG Chest Port 1 View  Result Date: 03/10/2020 CLINICAL DATA:  Status post central line placement EXAM: PORTABLE CHEST 1 VIEW COMPARISON:  Film from earlier in the same day. FINDINGS: Cardiac shadow is enlarged but stable. Dialysis catheter on the left is again seen. Right-sided central venous line is noted with catheter tip at the cavoatrial junction. No pneumothorax is seen. Persistent airspace opacity in the right base is noted. No other focal abnormality is noted. IMPRESSION: No pneumothorax following central line placement. Electronically Signed   By: Inez Catalina M.D.   On: 03/10/2020 23:51   DG Chest Port 1 View  Result Date: 03/10/2020 CLINICAL DATA:  Short of breath, chest pain EXAM: PORTABLE CHEST 1 VIEW COMPARISON:  03/07/2020 FINDINGS: Cardiac enlargement with vascular congestion and mild interstitial edema. Mild right lower lobe patchy airspace disease and small right effusion. Left jugular central venous catheter tip in the SVC unchanged. No pneumothorax. IMPRESSION: Cardiac enlargement with mild progression of vascular congestion and probable interstitial edema. Small right effusion. Mild progression of right lower lobe airspace disease. Electronically Signed   By: Franchot Gallo M.D.   On: 03/10/2020 10:47   ECHOCARDIOGRAM COMPLETE  Result Date: 03/11/2020    ECHOCARDIOGRAM REPORT   Patient Name:   [name] Date of Exam: 03/11/2020 Medical Rec #:  [id number]        Height:       62.0  in Accession #:    [id number]       Weight:       193.1 lb Date of Birth:  09-15-1950        BSA:          1.883 m Patient Age:    69 years         BP:           90/41 mmHg Patient Gender: F                HR:           107 bpm. Exam Location:  Inpatient Procedure: 2D Echo, 3D Echo, Color Doppler  and Cardiac Doppler STAT ECHO Indications:    Shock  History:        Patient has prior history of Echocardiogram examinations, most                 recent 11/23/2019. CHF, Arrythmias:Atrial Fibrillation; Risk                 Factors:Hypertension, Diabetes and Dyslipidemia.  Sonographer:    Raquel Sarna Senior RDCS Referring Phys: [phone] GRACE E BOWSER  Sonographer Comments: Scanned supine on bipap. IMPRESSIONS  1. Challenging to estimate EF with underlying tachycardia, however does not appear severly reduced.  2. Left ventricular ejection fraction, by estimation, is 45 to 50%. The left ventricle has mildly decreased function. The left ventricle demonstrates global hypokinesis. Left ventricular diastolic parameters are indeterminate.  3. Right ventricular systolic function is mildly reduced. The right ventricular size is moderately enlarged. There is moderately elevated pulmonary artery systolic pressure. The estimated right ventricular systolic pressure is 19.6 mmHg.  4. Left atrial size was severely dilated.  5. Right atrial size was moderately dilated.  6. The mitral valve is normal in structure. Mild mitral valve regurgitation. No evidence of mitral stenosis.  7. Tricuspid valve regurgitation is moderate to severe.  8. The aortic valve is tricuspid. Aortic valve regurgitation is not visualized. Mild to moderate aortic valve sclerosis/calcification is present, without any evidence of aortic stenosis.  9. The inferior vena cava is dilated in size with <50% respiratory variability, suggesting right atrial pressure of 15 mmHg. FINDINGS  Left Ventricle: Left ventricular ejection fraction, by estimation, is 45 to 50%. The left ventricle has mildly decreased function. The left ventricle demonstrates global hypokinesis. The left ventricular internal cavity size was normal in size. There is  no left ventricular hypertrophy. Left ventricular diastolic parameters are indeterminate. Right Ventricle: The right ventricular size is  moderately enlarged. No increase in right ventricular wall thickness. Right ventricular systolic function is mildly reduced. There is moderately elevated pulmonary artery systolic pressure. The tricuspid regurgitant velocity is 3.11 m/s, and with an assumed right atrial pressure of 15 mmHg, the estimated right ventricular systolic pressure is 22.2 mmHg. Left Atrium: Left atrial size was severely dilated. Right Atrium: Right atrial size was moderately dilated. Pericardium: There is no evidence of pericardial effusion. Mitral Valve: The mitral valve is normal in structure. Normal mobility of the mitral valve leaflets. Moderate mitral annular calcification. Mild mitral valve regurgitation. No evidence of mitral valve stenosis. Tricuspid Valve: The tricuspid valve is normal in structure. Tricuspid valve regurgitation is moderate to severe. No evidence of tricuspid stenosis. Aortic Valve: The aortic valve is tricuspid. Aortic valve regurgitation is not visualized. Mild to moderate aortic valve sclerosis/calcification is present, without any evidence of aortic stenosis. Pulmonic Valve: The pulmonic valve was normal in structure. Pulmonic valve regurgitation  is mild. No evidence of pulmonic stenosis. Aorta: The aortic root is normal in size and structure. Venous: The inferior vena cava is dilated in size with less than 50% respiratory variability, suggesting right atrial pressure of 15 mmHg. IAS/Shunts: No atrial level shunt detected by color flow Doppler. Additional Comments: Challenging to estimate EF with underlying tachycardia, however does not appear severly reduced.  LEFT VENTRICLE PLAX 2D LVIDd:         5.30 cm LVIDs:         4.40 cm LV PW:         1.00 cm LV IVS:        0.90 cm LVOT diam:     2.10 cm LV SV:         45 LV SV Index:   24 LVOT Area:     3.46 cm  RIGHT VENTRICLE RV S prime:     4.24 cm/s TAPSE (M-mode): 1.0 cm LEFT ATRIUM              Index       RIGHT ATRIUM           Index LA diam:        5.20 cm   2.76 cm/m  RA Area:     22.50 cm LA Vol (A2C):   145.0 ml 76.99 ml/m RA Volume:   70.50 ml  37.43 ml/m LA Vol (A4C):   142.0 ml 75.39 ml/m LA Biplane Vol: 145.0 ml 76.99 ml/m  AORTIC VALVE LVOT Vmax:   64.00 cm/s LVOT Vmean:  45.200 cm/s LVOT VTI:    0.130 m  AORTA Ao Root diam: 2.70 cm Ao Asc diam:  3.10 cm TRICUSPID VALVE TR Peak grad:   38.7 mmHg TR Vmax:        311.00 cm/s  SHUNTS Systemic VTI:  0.13 m Systemic Diam: 2.10 cm Candee Furbish MD Electronically signed by Candee Furbish MD Signature Date/Time: 03/11/2020/10:45:09 AM    Final    CT HEAD CODE STROKE WO CONTRAST  Result Date: 03/11/2020 CLINICAL DATA:  Code stroke.  69 year old female code stroke. EXAM: CT HEAD WITHOUT CONTRAST TECHNIQUE: Contiguous axial images were obtained from the base of the skull through the vertex without intravenous contrast. COMPARISON:  Head CT 03/07/2020.  Brain MRI 09/27/2018. FINDINGS: Brain: Gray-white matter differentiation is stable and normal for age throughout the brain. No midline shift, ventriculomegaly, mass effect, evidence of mass lesion, intracranial hemorrhage or evidence of cortically based acute infarction. Vascular: Calcified atherosclerosis at the skull base. No suspicious intracranial vascular hyperdensity. Skull: No acute osseous abnormality identified. Sinuses/Orbits: Visualized paranasal sinuses and mastoids are stable and well pneumatized. Other: No acute orbit or scalp soft tissue finding. ASPECTS [facility] Stroke Program Early CT Score) Total score (0-10 with 10 being normal): 10 IMPRESSION: 1. Stable and normal for age non contrast CT appearance of the brain. ASPECTS 10. 2. These results were communicated to Dr. Rory Percy at 10:19 pm on 03/11/2020 by text page via the [facility] messaging system. Electronically Signed   By: Genevie Ann M.D.   On: 03/11/2020 22:19   US Abdomen Limited RUQ  Result Date: 03/10/2020 CLINICAL DATA:  Elevated LFTs EXAM: ULTRASOUND ABDOMEN LIMITED RIGHT UPPER QUADRANT COMPARISON:   None. FINDINGS: Gallbladder: Surgically removed Common bile duct: Diameter: 4.5 mm Liver: Diffusely increased in echogenicity consistent with fatty infiltration. No mass is seen. Note is made of a small right pleural effusion. Other: None. IMPRESSION: Status post cholecystectomy. Fatty infiltration of the liver. No  acute abnormality in the upper abdomen is seen. Incidental note of right pleural effusion. Electronically Signed   By: Inez Catalina M.D.   On: 03/10/2020 21:03    Cardiac Studies   2D echocardiogram (03/11/2020)  IMPRESSIONS    1. Challenging to estimate EF with underlying tachycardia, however does  not appear severly reduced.  2. Left ventricular ejection fraction, by estimation, is 45 to 50%. The  left ventricle has mildly decreased function. The left ventricle  demonstrates global hypokinesis. Left ventricular diastolic parameters are  indeterminate.  3. Right ventricular systolic function is mildly reduced. The right  ventricular size is moderately enlarged. There is moderately elevated  pulmonary artery systolic pressure. The estimated right ventricular  systolic pressure is 24.5 mmHg.  4. Left atrial size was severely dilated.  5. Right atrial size was moderately dilated.  6. The mitral valve is normal in structure. Mild mitral valve  regurgitation. No evidence of mitral stenosis.  7. Tricuspid valve regurgitation is moderate to severe.  8. The aortic valve is tricuspid. Aortic valve regurgitation is not  visualized. Mild to moderate aortic valve sclerosis/calcification is  present, without any evidence of aortic stenosis.  9. The inferior vena cava is dilated in size with <50% respiratory  variability, suggesting right atrial pressure of 15 mmHg.  Patient Profile     [name] is a 69 y.o. female with a hx of chronic diastolic HF, persistent atrial fibrillation/flutter on anticoagulation with Eliquis, anemia, mild to moderate aortic stenosis, ESRD on  HD, HTN, HLD, bifascicular block (known RBBB + LAFB), COPD, DM 2, history of PE, seizure disorder, schizophrenia, Barrett's esophagus, memory loss and OSA who was seen yesterday in the emergency room for A. fib with RVR and hypotension sec related to septic shock  Assessment & Plan    1: Persistent atrial fibrillation with RVR-patient has persistent A. fib on negative chronotropic at home and Eliquis.  We saw her in the ER her heart rate was in the 150-170  range probably secondary to septic shock.  Her oral negative chronotropic's were discontinued because of hypotension as well as her Eliquis and she was begun on IV heparin.  Initially she was tried on IV diltiazem which was discontinued because of continued hypotension.  She received an amiodarone bolus followed by infusion and now his heart rates in the 110 120 range.  I suspect this is all driven by her infectious process and hypotension.  She is currently off pressor agents.  Her her blood pressures are in the 130 mmHg range systolic for the most part.  I think we can restart low-dose beta-blocker (metoprolol tartrate 12 1 5  mg p.o. twice daily) and follow heart rate and blood pressure..  2: Sepsis secondary to right lower lobe pneumonia-on antibiotics managed by PCCM.  She is afebrile.  Her white count was 20,000 and has come down to 15,000.   3: Acute on chronic respiratory failure with diastolic dysfunction.-Echo today showed an EF in the 50% range.  She does have moderately severe TR.  Her volume status is being managed by renal replacement therapy.  4: Moderate aortic stenosis-echo actually showed aortic sclerosis without stenosis.  5: Hypotension/shock-pressors have been discontinued.  Systolic blood pressure is in the 130 range.  6: End-stage renal disease-on hemodialysis  7: Elevated troponins-troponins went to 56.  This is in the setting of end-stage renal disease on hemodialysis and most likely demand ischemia from septic shock.  I do  not think this is related  to acute myocardial ischemia.  8: Elevated liver enzymes-most likely related to septic shock  Ms. Broeker appears clinically improved this morning.  She is currently off BiPAP and on 3 L nasal prong with excellent oxygen saturations.  Her blood pressure is better off of pressors.  She continues to get antibiotics with a declining white count.  She is alert and responsive but is undergoing neurological work-up.  Given her acceptable blood pressure off pressor agents with continued tachycardia on amiodarone I feel comfortable restarting p.o. negative chronotropic for rate control.  Her EF by 2D echo was in the 45 to 50% range.       For questions or updates, please contact [facility] Please consult [email or web address] for contact info under        Signed, Quay Burow, MD  03/12/2020, 9:36 AM

## 2020-03-12 NOTE — Plan of Care (Signed)

## 2020-03-12 NOTE — Progress Notes (Signed)
New orders for 3 units FFP and 3 units platelets. Dr. Ruthann Cancer notified of the fact that patient is no longer on CRRT, but should be getting HD tomorrow. She stated to only give 2 FFP and 1 platelet before obtaining labs and then readdress orders with CCM docs after labs resulted.  Joellen Jersey, RN

## 2020-03-12 NOTE — Progress Notes (Signed)
Patient weaned off pressors this AM. Notified Dr. Joelyn Oms who stated it was okay to stop CRRT in order to obtain MRI brain this afternoon. Stated they will attempt iHD tomorrow.  Joellen Jersey, RN

## 2020-03-12 NOTE — Procedures (Signed)
Patient Name: [name]  MRN: [id number]  Epilepsy Attending: Lora Havens  Referring Physician/Provider: Dr. Amie [place] Date: 03/12/2020 Duration: 24.37 minutes  Patient history: 69 year old female with past medical history of catatonia, schizoaffective disorder as well as history of seizures who was noted to have a blank stare with some chewing movements of the mouth and not responding to questions lasting for about 15 minutes.  EEG to evaluate for seizures.  Level of alertness: Awake  AEDs during EEG study: LEV  Technical aspects: This EEG study was done with scalp electrodes positioned according to the 10-20 International system of electrode placement. Electrical activity was acquired at a sampling rate of 500Hz  and reviewed with a high frequency filter of 70Hz  and a low frequency filter of 1Hz . EEG data were recorded continuously and digitally stored.   Description: The posterior dominant rhythm consists of 6 Hz activity of moderate voltage (25-35 uV) seen predominantly in posterior head regions, symmetric and reactive to eye opening and eye closing. EEG showed continuous generalized 5 to 6 Hz theta slowing. Hyperventilation and photic stimulation were not performed.     ABNORMALITY -Continuous slow, generalized -Background slow  IMPRESSION: This study is suggestive of moderate diffuse encephalopathy, nonspecific etiology. No seizures or epileptiform discharges were seen throughout the recording.   Matisha Termine Barbra Sarks

## 2020-03-13 ENCOUNTER — Inpatient Hospital Stay (HOSPITAL_COMMUNITY): Payer: Medicare Other

## 2020-03-13 DIAGNOSIS — I4891 Unspecified atrial fibrillation: Secondary | ICD-10-CM | POA: Diagnosis not present

## 2020-03-13 DIAGNOSIS — I5033 Acute on chronic diastolic (congestive) heart failure: Secondary | ICD-10-CM | POA: Diagnosis not present

## 2020-03-13 DIAGNOSIS — R748 Abnormal levels of other serum enzymes: Secondary | ICD-10-CM | POA: Diagnosis not present

## 2020-03-13 DIAGNOSIS — A4181 Sepsis due to Enterococcus: Secondary | ICD-10-CM | POA: Diagnosis not present

## 2020-03-13 DIAGNOSIS — R569 Unspecified convulsions: Secondary | ICD-10-CM | POA: Diagnosis not present

## 2020-03-13 DIAGNOSIS — R0602 Shortness of breath: Secondary | ICD-10-CM | POA: Diagnosis not present

## 2020-03-13 DIAGNOSIS — R7881 Bacteremia: Secondary | ICD-10-CM | POA: Diagnosis not present

## 2020-03-13 DIAGNOSIS — B952 Enterococcus as the cause of diseases classified elsewhere: Secondary | ICD-10-CM | POA: Diagnosis not present

## 2020-03-13 DIAGNOSIS — R6521 Severe sepsis with septic shock: Secondary | ICD-10-CM | POA: Diagnosis not present

## 2020-03-13 HISTORY — PX: IR REMOVAL TUN CV CATH W/O FL: IMG2289

## 2020-03-13 LAB — BPAM PLATELET PHERESIS
Blood Product Expiration Date: 202107292359
Blood Product Expiration Date: 202107302359
Blood Product Expiration Date: 202107302359
ISSUE DATE / TIME: 202107281808
Unit Type and Rh: 1700
Unit Type and Rh: 6200
Unit Type and Rh: 6200

## 2020-03-13 LAB — AMMONIA: Ammonia: 50 umol/L — ABNORMAL HIGH (ref 9–35)

## 2020-03-13 LAB — PREPARE FRESH FROZEN PLASMA
Unit division: 0
Unit division: 0
Unit division: 0

## 2020-03-13 LAB — PREPARE PLATELET PHERESIS
Unit division: 0
Unit division: 0
Unit division: 0

## 2020-03-13 LAB — RENAL FUNCTION PANEL
Albumin: 3 g/dL — ABNORMAL LOW (ref 3.5–5.0)
Anion gap: 12 (ref 5–15)
BUN: 32 mg/dL — ABNORMAL HIGH (ref 8–23)
CO2: 23 mmol/L (ref 22–32)
Calcium: 8.7 mg/dL — ABNORMAL LOW (ref 8.9–10.3)
Chloride: 100 mmol/L (ref 98–111)
Creatinine, Ser: 2.21 mg/dL — ABNORMAL HIGH (ref 0.44–1.00)
GFR calc Af Amer: 26 mL/min — ABNORMAL LOW (ref >60)
GFR calc non Af Amer: 22 mL/min — ABNORMAL LOW (ref >60)
Glucose, Bld: 252 mg/dL — ABNORMAL HIGH (ref 70–99)
Phosphorus: 2.3 mg/dL — ABNORMAL LOW (ref 2.5–4.6)
Potassium: 4 mmol/L (ref 3.5–5.1)
Sodium: 135 mmol/L (ref 135–145)

## 2020-03-13 LAB — BPAM FFP
Blood Product Expiration Date: 202108012359
Blood Product Expiration Date: 202108012359
Blood Product Expiration Date: 202108012359
ISSUE DATE / TIME: 202107281808
ISSUE DATE / TIME: 202107281808
Unit Type and Rh: 600
Unit Type and Rh: 6200
Unit Type and Rh: 6200

## 2020-03-13 LAB — HEPARIN LEVEL (UNFRACTIONATED): Heparin Unfractionated: 1.22 IU/mL — ABNORMAL HIGH (ref 0.30–0.70)

## 2020-03-13 LAB — LACTIC ACID, PLASMA
Lactic Acid, Venous: 2.7 mmol/L (ref 0.5–1.9)
Lactic Acid, Venous: 3.4 mmol/L (ref 0.5–1.9)

## 2020-03-13 LAB — CBC
HCT: 27.7 % — ABNORMAL LOW (ref 36.0–46.0)
Hemoglobin: 8.8 g/dL — ABNORMAL LOW (ref 12.0–15.0)
MCH: 36.1 pg — ABNORMAL HIGH (ref 26.0–34.0)
MCHC: 31.8 g/dL (ref 30.0–36.0)
MCV: 113.5 fL — ABNORMAL HIGH (ref 80.0–100.0)
Platelets: 46 10*3/uL — ABNORMAL LOW (ref 150–400)
RBC: 2.44 MIL/uL — ABNORMAL LOW (ref 3.87–5.11)
RDW: 16.2 % — ABNORMAL HIGH (ref 11.5–15.5)
WBC: 8.6 10*3/uL (ref 4.0–10.5)
nRBC: 2.1 % — ABNORMAL HIGH (ref 0.0–0.2)

## 2020-03-13 LAB — HEPATIC FUNCTION PANEL
ALT: 2005 U/L — ABNORMAL HIGH (ref 0–44)
AST: 1563 U/L — ABNORMAL HIGH (ref 15–41)
Albumin: 3 g/dL — ABNORMAL LOW (ref 3.5–5.0)
Alkaline Phosphatase: 140 U/L — ABNORMAL HIGH (ref 38–126)
Bilirubin, Direct: 0.4 mg/dL — ABNORMAL HIGH (ref 0.0–0.2)
Indirect Bilirubin: 1 mg/dL — ABNORMAL HIGH (ref 0.3–0.9)
Total Bilirubin: 1.4 mg/dL — ABNORMAL HIGH (ref 0.3–1.2)
Total Protein: 5.2 g/dL — ABNORMAL LOW (ref 6.5–8.1)

## 2020-03-13 LAB — PROTIME-INR
INR: 2 — ABNORMAL HIGH (ref 0.8–1.2)
Prothrombin Time: 22.3 seconds — ABNORMAL HIGH (ref 11.4–15.2)

## 2020-03-13 LAB — GLUCOSE, CAPILLARY
Glucose-Capillary: 240 mg/dL — ABNORMAL HIGH (ref 70–99)
Glucose-Capillary: 213 mg/dL — ABNORMAL HIGH (ref 70–99)
Glucose-Capillary: 207 mg/dL — ABNORMAL HIGH (ref 70–99)

## 2020-03-13 LAB — PROCALCITONIN: Procalcitonin: 7.43 ng/mL

## 2020-03-13 LAB — APTT: aPTT: 40 seconds — ABNORMAL HIGH (ref 24–36)

## 2020-03-13 MED ORDER — LACTULOSE 10 GM/15ML PO SOLN
10.0000 g | Freq: Two times a day (BID) | ORAL | Status: AC
  Administered 2020-03-13 – 2020-03-15 (×3): 10 g via ORAL
  Filled 2020-03-13 (×4): qty 15

## 2020-03-13 MED ORDER — DIPHENHYDRAMINE HCL 50 MG/ML IJ SOLN: 25.0000 mg | Freq: Once | INTRAMUSCULAR | Status: DC | PRN

## 2020-03-13 MED ORDER — SODIUM CHLORIDE 0.9 % IV SOLN
2.0000 g | Freq: Two times a day (BID) | INTRAVENOUS | Status: DC
  Administered 2020-03-13 – 2020-03-18 (×10): 2 g via INTRAVENOUS
  Filled 2020-03-13: qty 2000
  Filled 2020-03-13: qty 2
  Filled 2020-03-13: qty 2000
  Filled 2020-03-13: qty 2
  Filled 2020-03-13 (×2): qty 2000
  Filled 2020-03-13 (×2): qty 2
  Filled 2020-03-13 (×4): qty 2000

## 2020-03-13 MED ORDER — LIDOCAINE HCL (PF) 1 % IJ SOLN
INTRAMUSCULAR | Status: AC | PRN
  Administered 2020-03-13: 10 mL

## 2020-03-13 MED ORDER — EPINEPHRINE 0.3 MG/0.3ML IJ SOAJ
0.3000 mg | Freq: Once | INTRAMUSCULAR | Status: DC | PRN
  Filled 2020-03-13: qty 0.6

## 2020-03-13 MED ORDER — BUPROPION HCL ER (XL) 150 MG PO TB24
150.0000 mg | ORAL_TABLET | Freq: Every day | ORAL | Status: DC
  Administered 2020-03-13 – 2020-03-26 (×14): 150 mg via ORAL
  Filled 2020-03-13 (×14): qty 1

## 2020-03-13 MED ORDER — AMIODARONE LOAD VIA INFUSION
150.0000 mg | Freq: Once | INTRAVENOUS | Status: AC
  Administered 2020-03-13: 150 mg via INTRAVENOUS
  Filled 2020-03-13: qty 83.34

## 2020-03-13 MED ORDER — AMOXICILLIN 500 MG PO CAPS
500.0000 mg | ORAL_CAPSULE | Freq: Once | ORAL | Status: AC
  Administered 2020-03-13: 500 mg via ORAL
  Filled 2020-03-13: qty 1

## 2020-03-13 MED ORDER — TRAZODONE HCL 50 MG PO TABS
50.0000 mg | ORAL_TABLET | Freq: Every day | ORAL | Status: DC
  Administered 2020-03-13 – 2020-03-25 (×13): 50 mg via ORAL
  Filled 2020-03-13 (×13): qty 1

## 2020-03-13 MED ORDER — PREDNISONE 10 MG PO TABS
10.0000 mg | ORAL_TABLET | Freq: Every day | ORAL | Status: DC
  Administered 2020-03-15 – 2020-03-26 (×12): 10 mg via ORAL
  Filled 2020-03-13 (×13): qty 1

## 2020-03-13 MED ORDER — SODIUM CHLORIDE 0.9% IV SOLUTION: Freq: Once | INTRAVENOUS | Status: AC

## 2020-03-13 MED ORDER — LIDOCAINE HCL 1 % IJ SOLN
INTRAMUSCULAR | Status: AC
  Filled 2020-03-13: qty 20

## 2020-03-13 MED ORDER — METOPROLOL TARTRATE 25 MG PO TABS
25.0000 mg | ORAL_TABLET | Freq: Two times a day (BID) | ORAL | Status: DC
  Administered 2020-03-13 (×2): 25 mg via ORAL
  Filled 2020-03-13 (×3): qty 1

## 2020-03-13 MED ORDER — AMIODARONE IV BOLUS ONLY 150 MG/100ML: 150.0000 mg | Freq: Once | INTRAVENOUS | Status: DC

## 2020-03-13 NOTE — Procedures (Signed)
PROCEDURE SUMMARY:  Successful removal of tunneled hemodialysis catheter.  Patient tolerated well.  EBL < 5 mL  See full dictation in Imaging for details.  Abigail Butts S Brendalyn Vallely PA-C 03/13/2020 3:56 PM

## 2020-03-13 NOTE — Progress Notes (Signed)
  Speech Language Pathology Treatment: Dysphagia  Patient Details Name: [name] MRN: [id number] DOB: Sep 21, 1950 Today's Date: 03/13/2020 Time: 8413-2440 SLP Time Calculation (min) (ACUTE ONLY): 13 min  Assessment / Plan / Recommendation Clinical Impression  Pt was seen for dysphagia treatment. Her breakfast tray was present and pt reported that she was eating it. However, the tray appeared untouched. Her processing speed was slower than when she was seen yesterday and repetition was often needed for pt to respond to questions. Per pt's nurse, pt did not sleep well last night and has presented like this today. Mastication time was increased with pancakes but still functional. She tolerated 4/4 trials of thin liquids via cup without overt s/sx of aspiration but exhibited coughing with consecutive swallows. Her tolerance of thin liquids appears to be improving and prognosis for advancement is good if she continues to improve. SLP will continue to follow pt.    HPI HPI: 69 y.o. female, known to SLP services, with medical history significant for acute-on-chronic dysphagia, chronic hypoxemic respiratory failure secondary to underlying COPD-on 3 L of oxygen via nasal cannula, paroxysmal A. fib-on Eliquis, ESRD on hemodialysis (TTS), diastolic CHF, moderate AS, hypertension, hyperlipidemia, diabetes mellitus, obstructive sleep apnea/OHS, rheumatoid arthritis, seizure disorder, depression/anxiety, bipolar disorder presented to emergency department 7/26 with SOB and elevated blood glucose. Developed resp failure, transferred to ICU, on BIPAP, CRRT initiated; Dx sepsis secondary to RLL pna, afib with RVR, acute metabolic encephalopathy.  Pt has been followed by SLP during multiple admissions the last several years, with acute decompensations of swallow function.  She has had MBS studies, some of which showed deep penetration and/or aspiration of thin liquids, chin tuck being effective at one time.        SLP Plan  Continue with current plan of care       Recommendations  Diet recommendations: Regular;Nectar-thick liquid Liquids provided via: Cup Medication Administration: Whole meds with puree Supervision: Staff to assist with self feeding Compensations: Slow rate;Small sips/bites Postural Changes and/or Swallow Maneuvers: Seated upright 90 degrees                Oral Care Recommendations: Oral care BID Follow up Recommendations: Other (comment) (tba) SLP Visit Diagnosis: Dysphagia, oropharyngeal phase (R13.12) Plan: Continue with current plan of care       Donterius Filley I. Hardin Negus, [place], [place] Office number [phone] Pager [place] 03/13/2020, 10:19 AM

## 2020-03-13 NOTE — Progress Notes (Signed)
[facility]  Date of Admission:  03/10/2020     Total days of antibiotics 4         ASSESSMENT:  [name] blood cultures drawn on 7/28 are pending. Now off CRRT. Will need TEE to rule out endocarditis given Enterococcus faecalis bacteremia. Will also need central line removed when able for line holiday and will leave timing to CCM. Leukocytosis continues to improve. Passed her amoxicillin challenge this morning and will change to ampicillin.  PLAN:  1. Change linezolid to ampicillin. 2. Remove central line for holiday when able. 3. Monitor blood cultures for clearance of bacteremia. 4. TEE when able to rule out endocarditis.   Principal Problem:   Bacteremia due to Enterococcus Active Problems:   Type 2 diabetes mellitus with diabetic polyneuropathy, without long-term current use of insulin (HCC)   Diabetic peripheral neuropathy (HCC)   HYPERCHOLESTEROLEMIA   Gout, unspecified   Schizophrenia ([place])   Essential hypertension   Acute on chronic diastolic CHF (congestive heart failure) (HCC)   Hypothyroidism   ESRD (end stage renal disease) (HCC)   Elevated troponin   Acute on chronic respiratory failure with hypoxia (HCC)   Atrial fibrillation with RVR (HCC)   Macrocytic anemia   Thrombocytopenia (HCC)   Hyperkalemia   Septic shock (HCC)   Elevated liver enzymes   . ARIPiprazole  2 mg Oral Daily  . aspirin EC  81 mg Oral Q breakfast  . atorvastatin  20 mg Oral QHS  . calcitRIOL  0.25 mcg Oral Q M,W,F  . calcium acetate  2,001 mg Oral TID WC  . Chlorhexidine Gluconate Cloth  6 each Topical Daily  . feeding supplement (ENSURE ENLIVE)  237 mL Oral BID BM  . insulin aspart  0-15 Units Subcutaneous TID WC  . insulin aspart  2 Units Subcutaneous TID WC  . insulin detemir  5 Units Subcutaneous Daily  . levETIRAcetam  250 mg Oral BID  . levothyroxine  50 mcg Oral QAC breakfast  . mouth rinse  15 mL Mouth Rinse BID  . metoprolol tartrate  25  mg Oral BID  . montelukast  10 mg Oral QHS  . multivitamin  1 tablet Oral QHS  . pantoprazole  40 mg Oral Daily  . primidone  50 mg Oral Daily  . sodium chloride flush  10-40 mL Intracatheter Q12H  . Thrombi-Pad  1 each Topical Once    SUBJECTIVE:  Afebrile overnight with no acute events. Leukocytosis improved. Doing okay today. Less interactive than yesterday.   Allergies  Allergen Reactions  . Benztropine Mesylate Other (See Comments)    Alopecia and white spots on eyes  . Codeine Swelling, Palpitations and Other (See Comments)    Mouth Swelling and Tachycardia   . Diazepam Other (See Comments)    COMA  . Diphenhydramine Hcl Anxiety, Palpitations and Other (See Comments)    Tachycardia and anxiousness  . Sulfonamide Derivatives Rash    Blisters, also  . Thioridazine Hcl Swelling  . Bee Venom     Documented on MAR  . Lisinopril Cough     Review of Systems: Review of Systems  Constitutional: Negative for chills, fever and weight loss.  Respiratory: Negative for cough, shortness of breath and wheezing.   Cardiovascular: Negative for chest pain and leg swelling.  Gastrointestinal: Negative for abdominal pain, constipation, diarrhea, nausea and vomiting.  Skin: Negative for rash.      OBJECTIVE: Vitals:   03/13/20 0700 03/13/20 0800 03/13/20  0900 03/13/20 1010  BP: 114/78 128/71 (!) 113/64 (!) 126/62  Pulse: (!) 127 (!) 130 (!) 122 104  Resp: (!) 30 (!) 31 17 16   Temp:      TempSrc:      SpO2: 100% 100% 100% 100%  Weight:      Height:       Body mass index is 35.69 kg/m.  Physical Exam Constitutional:      General: She is not in acute distress.    Appearance: She is well-developed.  Cardiovascular:     Rate and Rhythm: Normal rate and regular rhythm.     Heart sounds: Normal heart sounds.     Comments: Central line in right upper chest with bandage. There is bleeding under the dressing.  Pulmonary:     Effort: Pulmonary effort is normal.     Breath  sounds: Normal breath sounds.  Skin:    General: Skin is warm and dry.  Neurological:     Mental Status: She is alert.     Lab Results Lab Results  Component Value Date   WBC 8.6 03/13/2020   HGB 8.8 (L) 03/13/2020   HCT 27.7 (L) 03/13/2020   MCV 113.5 (H) 03/13/2020   PLT 46 (L) 03/13/2020    Lab Results  Component Value Date   CREATININE 2.21 (H) 03/13/2020   BUN 32 (H) 03/13/2020   NA 135 03/13/2020   K 4.0 03/13/2020   CL 100 03/13/2020   CO2 23 03/13/2020    Lab Results  Component Value Date   ALT 2,005 (H) 03/13/2020   AST 1,563 (H) 03/13/2020   ALKPHOS 140 (H) 03/13/2020   BILITOT 1.4 (H) 03/13/2020     Microbiology: Recent Results (from the past 240 hour(s))  SARS Coronavirus 2 by RT PCR (hospital order, performed in [facility] hospital lab) Nasopharyngeal Nasopharyngeal Swab     Status: None   Collection Time: 03/07/20  1:13 AM   Specimen: Nasopharyngeal Swab  Result Value Ref Range Status   SARS Coronavirus 2 NEGATIVE NEGATIVE Final    Comment: (NOTE) SARS-CoV-2 target nucleic acids are NOT DETECTED.  The SARS-CoV-2 RNA is generally detectable in upper and lower respiratory specimens during the acute phase of infection. The lowest concentration of SARS-CoV-2 viral copies this assay can detect is 250 copies / mL. A negative result does not preclude SARS-CoV-2 infection and should not be used as the sole basis for treatment or other patient management decisions.  A negative result may occur with improper specimen collection / handling, submission of specimen other than nasopharyngeal swab, presence of viral mutation(s) within the areas targeted by this assay, and inadequate number of viral copies (<250 copies / mL). A negative result must be combined with clinical observations, patient history, and epidemiological information.  Fact Sheet for Patients:   StrictlyIdeas.no  Fact Sheet for Healthcare  Providers: BankingDealers.co.za  This test is not yet approved or  cleared by the [place] FDA and has been authorized for detection and/or diagnosis of SARS-CoV-2 by FDA under an Emergency Use Authorization (EUA).  This EUA will remain in effect (meaning this test can be used) for the duration of the COVID-19 declaration under Section 564(b)(1) of the Act, 21 U.S.C. section 360bbb-3(b)(1), unless the authorization is terminated or revoked sooner.  Performed at [facility], [address] [place], [place] [address]   SARS Coronavirus 2 by RT PCR (hospital order, performed in [facility] hospital lab) Nasopharyngeal Nasopharyngeal Swab     Status:  None   Collection Time: 03/10/20 10:32 AM   Specimen: Nasopharyngeal Swab  Result Value Ref Range Status   SARS Coronavirus 2 NEGATIVE NEGATIVE Final    Comment: (NOTE) SARS-CoV-2 target nucleic acids are NOT DETECTED.  The SARS-CoV-2 RNA is generally detectable in upper and lower respiratory specimens during the acute phase of infection. The lowest concentration of SARS-CoV-2 viral copies this assay can detect is 250 copies / mL. A negative result does not preclude SARS-CoV-2 infection and should not be used as the sole basis for treatment or other patient management decisions.  A negative result may occur with improper specimen collection / handling, submission of specimen other than nasopharyngeal swab, presence of viral mutation(s) within the areas targeted by this assay, and inadequate number of viral copies (<250 copies / mL). A negative result must be combined with clinical observations, patient history, and epidemiological information.  Fact Sheet for Patients:   StrictlyIdeas.no  Fact Sheet for Healthcare Providers: BankingDealers.co.za  This test is not yet approved or  cleared by the [place] FDA and has been authorized for detection and/or  diagnosis of SARS-CoV-2 by FDA under an Emergency Use Authorization (EUA).  This EUA will remain in effect (meaning this test can be used) for the duration of the COVID-19 declaration under Section 564(b)(1) of the Act, 21 U.S.C. section 360bbb-3(b)(1), unless the authorization is terminated or revoked sooner.  Performed at [facility], [place] [address], [place], [place] [address]   Blood Culture (routine x 2)     Status: Abnormal   Collection Time: 03/10/20 11:12 AM   Specimen: BLOOD  Result Value Ref Range Status   Specimen Description BLOOD LEFT ANTECUBITAL  Final   Special Requests   Final    BOTTLES DRAWN AEROBIC AND ANAEROBIC Blood Culture adequate volume   Culture  Setup Time   Final    GRAM POSITIVE COCCI IN CHAINS IN BOTH AEROBIC AND ANAEROBIC BOTTLES Organism ID to follow CRITICAL RESULT CALLED TO, READ BACK BY AND VERIFIED WITH: PHARMD VERANDA BRICK AT [place] ON 03/11/2020 Performed at [facility], [place] [address], [place], [place] [address]    Culture ENTEROCOCCUS FAECALIS (A)  Final   Report Status 03/12/2020 FINAL  Final   Organism ID, Bacteria ENTEROCOCCUS FAECALIS  Final      Susceptibility   Enterococcus faecalis - MIC*    AMPICILLIN <=2 SENSITIVE Sensitive     VANCOMYCIN 1 SENSITIVE Sensitive     GENTAMICIN SYNERGY SENSITIVE Sensitive     * ENTEROCOCCUS FAECALIS  Blood Culture ID Panel (Reflexed)     Status: Abnormal   Collection Time: 03/10/20 11:12 AM  Result Value Ref Range Status   Enterococcus species DETECTED (A) NOT DETECTED Final    Comment: CRITICAL RESULT CALLED TO, READ BACK BY AND VERIFIED WITH: PHARMD VERANDA BRICK AT 0052 BY MESSAN HOUEGNIFIO ON 03/11/2020    Vancomycin resistance NOT DETECTED NOT DETECTED Final   Listeria monocytogenes NOT DETECTED NOT DETECTED Final   Staphylococcus species NOT DETECTED NOT DETECTED Final   Staphylococcus aureus (BCID) NOT DETECTED NOT DETECTED Final   Streptococcus species  NOT DETECTED NOT DETECTED Final   Streptococcus agalactiae NOT DETECTED NOT DETECTED Final   Streptococcus pneumoniae NOT DETECTED NOT DETECTED Final   Streptococcus pyogenes NOT DETECTED NOT DETECTED Final   Acinetobacter baumannii NOT DETECTED NOT DETECTED Final   Enterobacteriaceae species NOT DETECTED NOT DETECTED Final   Enterobacter cloacae complex NOT DETECTED NOT DETECTED  Final   Escherichia coli NOT DETECTED NOT DETECTED Final   Klebsiella oxytoca NOT DETECTED NOT DETECTED Final   Klebsiella pneumoniae NOT DETECTED NOT DETECTED Final   Proteus species NOT DETECTED NOT DETECTED Final   Serratia marcescens NOT DETECTED NOT DETECTED Final   Haemophilus influenzae NOT DETECTED NOT DETECTED Final   Neisseria meningitidis NOT DETECTED NOT DETECTED Final   Pseudomonas aeruginosa NOT DETECTED NOT DETECTED Final   Candida albicans NOT DETECTED NOT DETECTED Final   Candida glabrata NOT DETECTED NOT DETECTED Final   Candida krusei NOT DETECTED NOT DETECTED Final   Candida parapsilosis NOT DETECTED NOT DETECTED Final   Candida tropicalis NOT DETECTED NOT DETECTED Final    Comment: Performed at [facility], [place] [address], [place], [place] [address]  Blood Culture (routine x 2)     Status: Abnormal   Collection Time: 03/10/20 11:35 AM   Specimen: BLOOD LEFT FOREARM  Result Value Ref Range Status   Specimen Description BLOOD LEFT FOREARM  Final   Special Requests   Final    BOTTLES DRAWN AEROBIC AND ANAEROBIC Blood Culture adequate volume   Culture  Setup Time   Final    GRAM POSITIVE COCCI IN CHAINS IN BOTH AEROBIC AND ANAEROBIC BOTTLES CRITICAL VALUE NOTED.  VALUE IS CONSISTENT WITH PREVIOUSLY REPORTED AND CALLED VALUE.    Culture (A)  Final    ENTEROCOCCUS FAECALIS SUSCEPTIBILITIES PERFORMED ON PREVIOUS CULTURE WITHIN THE LAST 5 DAYS. Performed at [facility], [place] [address] [place], [place] [address]    Report Status 03/12/2020 FINAL  Final  MRSA PCR Screening      Status: Abnormal   Collection Time: 03/10/20  6:08 PM   Specimen: Nasopharyngeal  Result Value Ref Range Status   MRSA by PCR (A) NEGATIVE Final    INVALID, UNABLE TO DETERMINE THE PRESENCE OF TARGET DUE TO SPECIMEN INTEGRITY. RECOLLECTION REQUESTED.    Comment: A,KOTEY @[id number]  03/10/20 EB Performed at [place] [address], [place], [place] [address]   MRSA PCR Screening     Status: None   Collection Time: 03/11/20  8:17 AM   Specimen: Nasal Mucosa; Nasopharyngeal  Result Value Ref Range Status   MRSA by PCR NEGATIVE NEGATIVE Final    Comment:        The GeneXpert MRSA Assay (FDA approved for NASAL specimens only), is one component of a comprehensive MRSA colonization surveillance program. It is not intended to diagnose MRSA infection nor to guide or monitor treatment for MRSA infections. Performed at [facility], [place] [address], [place], [place] [address]   Urine culture     Status: Abnormal   Collection Time: 03/11/20  9:17 AM   Specimen: In/Out Cath Urine  Result Value Ref Range Status   Specimen Description IN/OUT CATH URINE  Final   Special Requests   Final    NONE Performed at [facility], [place] [address], [place], [place] [address]    Culture MULTIPLE SPECIES PRESENT, SUGGEST RECOLLECTION (A)  Final   Report Status 03/12/2020 FINAL  Final     Terri Piedra, NP [facility] for Infectious Disease [facility]  03/13/2020  10:18 AM

## 2020-03-13 NOTE — Progress Notes (Signed)
Pt drowsy with HR is in the 130s,  Md on called stated that when patient is more alert given Metoprolol l that's scheduled PO. Not to give IV do to history of resent hypotensive.

## 2020-03-13 NOTE — Progress Notes (Signed)
Progress Note  Patient Name: [name] Date of Encounter: 03/13/2020  [facility] Cardiologist: Rozann Lesches, MD   Subjective   Patient more alert and communicative off of BiPAP this morning on 3 L nasal prong with saturations in the high 90s.  She feels clinically improved and seems to be more alert.  She denies chest pain or shortness of breath.  Scheduled for dialysis tomorrow.  Inpatient Medications    Scheduled Meds: . amoxicillin  500 mg Oral Once  . ARIPiprazole  2 mg Oral Daily  . aspirin EC  81 mg Oral Q breakfast  . atorvastatin  20 mg Oral QHS  . calcitRIOL  0.25 mcg Oral Q M,W,F  . calcium acetate  2,001 mg Oral TID WC  . Chlorhexidine Gluconate Cloth  6 each Topical Daily  . feeding supplement (ENSURE ENLIVE)  237 mL Oral BID BM  . insulin aspart  0-15 Units Subcutaneous TID WC  . insulin aspart  2 Units Subcutaneous TID WC  . insulin detemir  5 Units Subcutaneous Daily  . levETIRAcetam  250 mg Oral BID  . levothyroxine  50 mcg Oral QAC breakfast  . linezolid  600 mg Oral Q12H  . mouth rinse  15 mL Mouth Rinse BID  . metoprolol tartrate  12.5 mg Oral BID  . montelukast  10 mg Oral QHS  . multivitamin  1 tablet Oral QHS  . pantoprazole  40 mg Oral Daily  . primidone  50 mg Oral Daily  . sodium chloride flush  10-40 mL Intracatheter Q12H  . Thrombi-Pad  1 each Topical Once   Continuous Infusions: . sodium chloride    . amiodarone 30 mg/hr (03/13/20 0600)  . heparin Stopped (03/12/20 1505)  . phenylephrine (NEO-SYNEPHRINE) Adult infusion Stopped (03/12/20 1435)   PRN Meds: acetaminophen, albuterol, calcium carbonate, diphenhydrAMINE, docusate sodium, EPINEPHrine, heparin, nitroGLYCERIN, ondansetron (ZOFRAN) IV, sodium chloride flush   Vital Signs    Vitals:   03/13/20 0500 03/13/20 0530 03/13/20 0600 03/13/20 0700  BP: (!) 125/89 121/82 128/76 114/78  Pulse: (!) 127 (!) 131 (!) 122 (!) 127  Resp: (!) 28 21 15  (!) 30  Temp:      TempSrc:       SpO2: 100% 100% 99% 100%  Weight: 88.5 kg     Height:        Intake/Output Summary (Last 24 hours) at 03/13/2020 0832 Last data filed at 03/13/2020 0600 Gross per 24 hour  Intake 1957.9 ml  Output 431 ml  Net 1526.9 ml   Last 3 Weights 03/13/2020 03/12/2020 03/11/2020  Weight (lbs) 195 lb 1.7 oz 191 lb 9.3 oz 193 lb 2 oz  Weight (kg) 88.5 kg 86.9 kg 87.6 kg      Telemetry    A. fib with RVR, rates in the 110-120  Personally Reviewed  ECG    Not performed today- Personally Reviewed  Physical Exam   GEN: No acute distress.   Neck: No JVD Cardiac:  Irregularly irregular, tachycardic Respiratory: Clear to auscultation bilat she has erally. GI: Soft, nontender, non-distended  MS: No edema; No deformity. Neuro:  Nonfocal  Psych: Were communicative this morning  Labs    High Sensitivity Troponin:   Recent Labs  Lab 03/10/20 1032 03/10/20 1323  TROPONINIHS 42* 56*      Chemistry Recent Labs  Lab 03/10/20 1112 03/11/20 0335 03/11/20 1517 03/12/20 0301 03/13/20 0214  NA  --    < > 135 138 135  K  --    < >  3.8 4.2 4.0  CL  --    < > 102 102 100  CO2  --    < > 21* 22 23  GLUCOSE  --    < > 320* 271* 252*  BUN  --    < > 25* 24* 32*  CREATININE  --    < > 1.96* 1.83* 2.21*  CALCIUM  --    < > 8.3* 8.6* 8.7*  PROT 5.0*  --   --   --  5.2*  ALBUMIN 3.1*   < > 2.8* 3.0* 3.0*  3.0*  AST 432*  --   --   --  1,563*  ALT 330*  --   --   --  2,005*  ALKPHOS 99  --   --   --  140*  BILITOT 1.5*  --   --   --  1.4*  GFRNONAA  --    < > 26* 28* 22*  GFRAA  --    < > 30* 32* 26*  ANIONGAP  --    < > 12 14 12    < > = values in this interval not displayed.     Hematology Recent Labs  Lab 03/12/20 0300 03/12/20 1521 03/13/20 0214  WBC 15.3* 11.0* 8.6  RBC 2.85* 2.38* 2.44*  HGB 10.4* 8.5* 8.8*  HCT 32.5* 26.7* 27.7*  MCV 114.0* 112.2* 113.5*  MCH 36.5* 35.7* 36.1*  MCHC 32.0 31.8 31.8  RDW 16.3* 16.2* 16.2*  PLT 80* 48* 46*    BNP Recent Labs  Lab  03/10/20 1032  BNP 1,486.7*     DDimer No results for input(s): DDIMER in the last 168 hours.   Radiology    EEG  Result Date: 03/12/2020 Lora Havens, MD     03/12/2020 10:19 AM Patient Name: SHANTAI TIEDEMAN MRN: [id number] Epilepsy Attending: Lora Havens Referring Physician/Provider: Dr. Amie [place] Date: 03/12/2020 Duration: 24.37 minutes Patient history: 69 year old female with past medical history of catatonia, schizoaffective disorder as well as history of seizures who was noted to have a blank stare with some chewing movements of the mouth and not responding to questions lasting for about 15 minutes.  EEG to evaluate for seizures. Level of alertness: Awake AEDs during EEG study: LEV Technical aspects: This EEG study was done with scalp electrodes positioned according to the 10-20 International system of electrode placement. Electrical activity was acquired at a sampling rate of 500Hz  and reviewed with a high frequency filter of 70Hz  and a low frequency filter of 1Hz . EEG data were recorded continuously and digitally stored. Description: The posterior dominant rhythm consists of 6 Hz activity of moderate voltage (25-35 uV) seen predominantly in posterior head regions, symmetric and reactive to eye opening and eye closing. EEG showed continuous generalized 5 to 6 Hz theta slowing. Hyperventilation and photic stimulation were not performed.   ABNORMALITY -Continuous slow, generalized -Background slow IMPRESSION: This study is suggestive of moderate diffuse encephalopathy, nonspecific etiology. No seizures or epileptiform discharges were seen throughout the recording. Lora Havens   MR BRAIN WO CONTRAST  Result Date: 03/12/2020 CLINICAL DATA:  69 year old female with altered mental status. On anticoagulation for atrial fibrillation. EXAM: MRI HEAD WITHOUT CONTRAST TECHNIQUE: Multiplanar, multiecho pulse sequences of the brain and surrounding structures were obtained without intravenous  contrast. COMPARISON:  Head CT yesterday.  Brain MRI 09/27/2018. FINDINGS: Brain: Stable cerebral volume. No restricted diffusion to suggest acute infarction. No midline shift, mass effect, evidence of mass lesion, ventriculomegaly,  extra-axial collection or acute intracranial hemorrhage. Cervicomedullary junction and pituitary are within normal limits. Stable minimal nonspecific cerebral white matter T2 and FLAIR hyperintensity since 2020. Suggestion of a small chronic focus of encephalomalacia along the medial cerebellum near the vermis (series 6, image 7). No other cortical encephalomalacia. No chronic cerebral blood products are evident. Deep gray nuclei and brainstem remain normal. Vascular: Major intracranial vascular flow voids are stable since last year. Skull and upper cervical spine: Negative. Sinuses/Orbits: Stable and negative orbits. Small maxillary sinus mucous retention cysts are stable. Other: Trace mastoid fluid is stable since last year. Grossly normal visible internal auditory structures. Scalp and face soft tissues appear negative. IMPRESSION: 1.  No acute intracranial abnormality. 2. 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 Electronically Signed   By: Genevie Ann M.D.   On: 03/12/2020 15:09   DG CHEST PORT 1 VIEW  Result Date: 03/12/2020 CLINICAL DATA:  Pleural effusion. EXAM: PORTABLE CHEST 1 VIEW COMPARISON:  Radiograph 03/10/2020.  Most recent CT 01/23/2020 FINDINGS: Right subclavian central catheter tip in the lower SVC. Lateral portion of catheter is looped. Left-sided dialysis catheter tip in the lower SVC. Hazy opacity at the right lung base likely represents pleural effusion. There is no visualized pneumothorax. Stable cardiomegaly. Vascular congestion. Interstitial opacities may represent pulmonary edema. IMPRESSION: 1. Right subclavian central catheter tip in the lower SVC. Lateral  portion of catheter is looped, this portion may be external to the patient. 2. Hazy opacity at the right lung base likely represents pleural effusion. 3. Stable cardiomegaly, vascular congestion and interstitial opacities, suggesting pulmonary edema. Electronically Signed   By: Keith Rake M.D.   On: 03/12/2020 17:31   CT HEAD CODE STROKE WO CONTRAST  Result Date: 03/11/2020 CLINICAL DATA:  Code stroke.  69 year old female code stroke. EXAM: CT HEAD WITHOUT CONTRAST TECHNIQUE: Contiguous axial images were obtained from the base of the skull through the vertex without intravenous contrast. COMPARISON:  Head CT 03/07/2020.  Brain MRI 09/27/2018. FINDINGS: Brain: Gray-white matter differentiation is stable and normal for age throughout the brain. No midline shift, ventriculomegaly, mass effect, evidence of mass lesion, intracranial hemorrhage or evidence of cortically based acute infarction. Vascular: Calcified atherosclerosis at the skull base. No suspicious intracranial vascular hyperdensity. Skull: No acute osseous abnormality identified. Sinuses/Orbits: Visualized paranasal sinuses and mastoids are stable and well pneumatized. Other: No acute orbit or scalp soft tissue finding. ASPECTS [facility] Stroke Program Early CT Score) Total score (0-10 with 10 being normal): 10 IMPRESSION: 1. Stable and normal for age non contrast CT appearance of the brain. ASPECTS 10. 2. These results were communicated to Dr. Rory Percy at 10:19 pm on 03/11/2020 by text page via the [facility] messaging system. Electronically Signed   By: Genevie Ann M.D.   On: 03/11/2020 22:19    Cardiac Studies   2D echocardiogram (03/11/2020)  IMPRESSIONS    1. Challenging to estimate EF with underlying tachycardia, however does  not appear severly reduced.  2. Left ventricular ejection fraction, by estimation, is 45 to 50%. The  left ventricle has mildly decreased function. The left ventricle  demonstrates global hypokinesis. Left ventricular  diastolic parameters are  indeterminate.  3. Right ventricular systolic function is mildly reduced. The right  ventricular size is moderately enlarged. There is moderately elevated  pulmonary artery systolic pressure. The estimated right ventricular  systolic pressure is 67.2 mmHg.  4. Left atrial  size was severely dilated.  5. Right atrial size was moderately dilated.  6. The mitral valve is normal in structure. Mild mitral valve  regurgitation. No evidence of mitral stenosis.  7. Tricuspid valve regurgitation is moderate to severe.  8. The aortic valve is tricuspid. Aortic valve regurgitation is not  visualized. Mild to moderate aortic valve sclerosis/calcification is  present, without any evidence of aortic stenosis.  9. The inferior vena cava is dilated in size with <50% respiratory  variability, suggesting right atrial pressure of 15 mmHg.  Patient Profile     RAVENNE WAYMENT is a 69 y.o. female with a hx of chronic diastolic HF, persistent atrial fibrillation/flutter on anticoagulation with Eliquis, anemia, mild to moderate aortic stenosis, ESRD on HD, HTN, HLD, bifascicular block (known RBBB + LAFB), COPD, DM 2, history of PE, seizure disorder, schizophrenia, Barrett's esophagus, memory loss and OSA who was seen yesterday in the emergency room for A. fib with RVR and hypotension sec related to septic shock  Assessment & Plan    1: Persistent atrial fibrillation with RVR-patient has persistent A. fib on negative chronotropic at home and Eliquis.  We saw her in the ER her heart rate was in the 150-170  range probably secondary to septic shock.  Her oral negative chronotropic's were discontinued because of hypotension as well as her Eliquis and she was begun on IV heparin.  Initially she was tried on IV diltiazem which was discontinued because of continued hypotension.  She received an amiodarone bolus followed by infusion and now his heart rates in the 110 120 range.  I suspect  this is all driven by her infectious process and hypotension.  She is currently off pressor agents.  Her her blood pressures are in the 130 mmHg range systolic for the most part.  We started metoprolol 12.5 mg p.o. twice daily yesterday which did not adversely affect her blood pressure but still did not bring down her heart rate.  We will titrate this up to 25 mg p.o. twice daily.  Of note, she was on Toprol-XL 200 mg as an outpatient..  2: Sepsis secondary to right lower lobe pneumonia-on antibiotics managed by PCCM.  She is afebrile.  Her white count was 20,000 and has come down to 8600   3: Acute on chronic respiratory failure with diastolic dysfunction.-Echo showed an EF in the 50% range.  She does have moderately severe TR.  Her volume status is being managed by renal replacement therapy.  4: Moderate aortic stenosis-echo actually showed aortic sclerosis without stenosis.  5: Hypotension/shock-pressors have been discontinued.  Systolic blood pressure is in the 130 range.  6: End-stage renal disease-on hemodialysis  7: Elevated troponins-troponins went to 56.  This is in the setting of end-stage renal disease on hemodialysis and most likely demand ischemia from septic shock.  I do not think this is related to acute myocardial ischemia.  8: Elevated liver enzymes-most likely related to septic shock  Ms. Cespedes appears clinically to have improved.  Her blood pressures are stable.  She denies chest pain or shortness of breath.  She is communicative but somewhat slow.  She is scheduled for hemodialysis tomorrow.  Despite initiating p.o. metoprolol low-dose her heart rate still remain elevated and a spike last night.  We will titrate her beta-blocker based on heart rate and blood pressure.  It appears that her sepsis is being adequately treated..       For questions or updates, please contact [place] Please consult  [email or web address] for contact info under        Signed, Quay Burow,  MD  03/13/2020, 8:32 AM

## 2020-03-13 NOTE — Progress Notes (Signed)
Inpatient Diabetes Program Recommendations  AACE/ADA: New Consensus Statement on Inpatient Glycemic Control (2015)  Target Ranges:  Prepandial:   less than 140 mg/dL      Peak postprandial:   less than 180 mg/dL (1-2 hours)      Critically ill patients:  140 - 180 mg/dL     Review of Glycemic Control Results for [name], [name] (MRN [id number]) as of 03/13/2020 09:11  Ref. Range 03/12/2020 07:58 03/12/2020 11:21 03/12/2020 14:52 03/12/2020 21:50 03/13/2020 06:16  Glucose-Capillary Latest Ref Range: 70 - 99 mg/dL 197 (H) 272 (H) 367 (H) 259 (H) 240 (H)   Diabetes history: DM 2 Outpatient Diabetes medications:  SNF Meds: Trulicity 1.5 mg Qweek + Novolog 10 units TID + Novolog 2-12 units TID per SSI Current orders for Inpatient glycemic control:  Novolog moderate tid with meals and HS Levemir 5 units Daily  Inpatient Diabetes Program Recommendations:    -Consider increasing Levemir to 8 units Daily  - Due to renal function decrease Novolog Correction to "Sensitive" 0-9 units.  Thanks, Tama Headings RN, MSN, BC-ADM Inpatient Diabetes Coordinator Team Pager [phone] (8a-5p)

## 2020-03-13 NOTE — Progress Notes (Addendum)
Subjective: Patient in bed sleeping, easily arouses to name calling.  Objective: Current vital signs: BP 114/78   Pulse (!) 127   Temp 98.6 F (37 C) (Oral)   Resp (!) 30   Ht 5\' 2"  (1.575 m)   Wt 88.5 kg   SpO2 100%   BMI 35.69 kg/m  Vital signs in last 24 hours: Temp:  [97.4 F (36.3 C)-98.7 F (37.1 C)] 98.6 F (37 C) (07/29 0400) Pulse Rate:  [27-143] 127 (07/29 0700) Resp:  [13-32] 30 (07/29 0700) BP: (96-147)/(7-118) 114/78 (07/29 0700) SpO2:  [86 %-100 %] 100 % (07/29 0700) Weight:  [88.5 kg] 88.5 kg (07/29 0500)  Intake/Output from previous day: 07/28 0701 - 07/29 0700 In: 1984.7 [P.O.:480; I.V.:501.3; Blood:1003.4] Out: 528 [Urine:1; Stool:1] Intake/Output this shift: No intake/output data recorded. Nutritional status:  Diet Order            Diet renal with fluid restriction Fluid restriction: 1200 mL Fluid; Room service appropriate? Yes with Assist; Fluid consistency: Nectar Thick  Diet effective now                General: in bed NAD Resp:  [place] oxygen 02 sats WNL Abd: soft, non-tender Extremities: left arm cool to touch and dry, right arm warm and dry, BLE in praffo boots.  Neurologic Exam: Ment: awake, alert, able to follow some simple commands. Asked her to wiggle toes (she raised her legs), patient did perseverate on the year. Unable to state month. Was able to state name, age, year correctly. CN: VFF, PERRL, EOMI, face symmetric. Motor: able to raise arms and legs ant-gravity. Hand grips equal. Tongue protrudes midline. Sensory: intact to LT  Lab Results: Results for orders placed or performed during the hospital encounter of 03/10/20 (from the past 48 hour(s))  Glucose, capillary     Status: Abnormal   Collection Time: 03/11/20  7:56 AM  Result Value Ref Range   Glucose-Capillary 56 (L) 70 - 99 mg/dL    Comment: Glucose reference range applies only to samples taken after fasting for at least 8 hours.  MRSA PCR Screening     Status: None    Collection Time: 03/11/20  8:17 AM   Specimen: Nasal Mucosa; Nasopharyngeal  Result Value Ref Range   MRSA by PCR NEGATIVE NEGATIVE    Comment:        The GeneXpert MRSA Assay (FDA approved for NASAL specimens only), is one component of a comprehensive MRSA colonization surveillance program. It is not intended to diagnose MRSA infection nor to guide or monitor treatment for MRSA infections. Performed at [facility], [place] [address], [place], [place] [address]   Glucose, capillary     Status: None   Collection Time: 03/11/20  8:23 AM  Result Value Ref Range   Glucose-Capillary 96 70 - 99 mg/dL    Comment: Glucose reference range applies only to samples taken after fasting for at least 8 hours.  Urine culture     Status: Abnormal   Collection Time: 03/11/20  9:17 AM   Specimen: In/Out Cath Urine  Result Value Ref Range   Specimen Description IN/OUT CATH URINE    Special Requests      NONE Performed at [facility], [address] [place], [place] [address]    Culture MULTIPLE SPECIES PRESENT, SUGGEST RECOLLECTION (A)    Report Status 03/12/2020 FINAL   Glucose, capillary     Status: Abnormal   Collection Time: 03/11/20 11:18 AM  Result Value Ref Range   Glucose-Capillary 143 (H) 70 - 99 mg/dL    Comment: Glucose reference range applies only to samples taken after fasting for at least 8 hours.  APTT     Status: Abnormal   Collection Time: 03/11/20 11:37 AM  Result Value Ref Range   aPTT 107 (H) 24 - 36 seconds    Comment:        IF BASELINE aPTT IS ELEVATED, SUGGEST PATIENT RISK ASSESSMENT BE USED TO DETERMINE APPROPRIATE ANTICOAGULANT THERAPY. Performed at [facility], [place] [address] [place], [place] [address]   Glucose, capillary     Status: Abnormal   Collection Time: 03/11/20  3:14 PM  Result Value Ref Range   Glucose-Capillary 298 (H) 70 - 99 mg/dL    Comment: Glucose reference range applies only to samples taken after fasting for at least 8  hours.  Renal function panel (daily at 1600)     Status: Abnormal   Collection Time: 03/11/20  3:17 PM  Result Value Ref Range   Sodium 135 135 - 145 mmol/L   Potassium 3.8 3.5 - 5.1 mmol/L   Chloride 102 98 - 111 mmol/L   CO2 21 (L) 22 - 32 mmol/L   Glucose, Bld 320 (H) 70 - 99 mg/dL    Comment: Glucose reference range applies only to samples taken after fasting for at least 8 hours.   BUN 25 (H) 8 - 23 mg/dL   Creatinine, Ser 1.96 (H) 0.44 - 1.00 mg/dL   Calcium 8.3 (L) 8.9 - 10.3 mg/dL   Phosphorus 2.9 2.5 - 4.6 mg/dL   Albumin 2.8 (L) 3.5 - 5.0 g/dL   GFR calc non Af Amer 26 (L) >60 mL/min   GFR calc Af Amer 30 (L) >60 mL/min   Anion gap 12 5 - 15    Comment: Performed at [facility] [address], [place], [place] [address]  APTT     Status: Abnormal   Collection Time: 03/11/20  8:44 PM  Result Value Ref Range   aPTT 77 (H) 24 - 36 seconds    Comment:        IF BASELINE aPTT IS ELEVATED, SUGGEST PATIENT RISK ASSESSMENT BE USED TO DETERMINE APPROPRIATE ANTICOAGULANT THERAPY. Performed at [facility], [place] [address] [place], [place] [address]   Glucose, capillary     Status: Abnormal   Collection Time: 03/11/20  9:46 PM  Result Value Ref Range   Glucose-Capillary 261 (H) 70 - 99 mg/dL    Comment: Glucose reference range applies only to samples taken after fasting for at least 8 hours.  Glucose, capillary     Status: Abnormal   Collection Time: 03/11/20 11:53 PM  Result Value Ref Range   Glucose-Capillary 295 (H) 70 - 99 mg/dL    Comment: Glucose reference range applies only to samples taken after fasting for at least 8 hours.  Procalcitonin     Status: None   Collection Time: 03/12/20  3:00 AM  Result Value Ref Range   Procalcitonin 6.06 ng/mL    Comment:        Interpretation: PCT > 2 ng/mL: Systemic infection (sepsis) is likely, unless other causes are known. (NOTE)       Sepsis PCT Algorithm           Lower Respiratory Tract  Infection PCT Algorithm    ----------------------------     ----------------------------         PCT < 0.25 ng/mL                PCT < 0.10 ng/mL          Strongly encourage             Strongly discourage   discontinuation of antibiotics    initiation of antibiotics    ----------------------------     -----------------------------       PCT 0.25 - 0.50 ng/mL            PCT 0.10 - 0.25 ng/mL               OR       >80% decrease in PCT            Discourage initiation of                                            antibiotics      Encourage discontinuation           of antibiotics    ----------------------------     -----------------------------         PCT >= 0.50 ng/mL              PCT 0.26 - 0.50 ng/mL               AND       <80% decrease in PCT              Encourage initiation of                                             antibiotics       Encourage continuation           of antibiotics    ----------------------------     -----------------------------        PCT >= 0.50 ng/mL                  PCT > 0.50 ng/mL               AND         increase in PCT                  Strongly encourage                                      initiation of antibiotics    Strongly encourage escalation           of antibiotics                                     -----------------------------                                           PCT <= 0.25 ng/mL  OR                                        > 80% decrease in PCT                                      Discontinue / Do not initiate                                             antibiotics  Performed at [facility], [place] [address] [place], [place] [address]   CBC     Status: Abnormal   Collection Time: 03/12/20  3:00 AM  Result Value Ref Range   WBC 15.3 (H) 4.0 - 10.5 K/uL   RBC 2.85 (L) 3.87 - 5.11 MIL/uL   Hemoglobin 10.4 (L) 12.0 - 15.0 g/dL   HCT 32.5 (L) 36 - 46 %    MCV 114.0 (H) 80.0 - 100.0 fL   MCH 36.5 (H) 26.0 - 34.0 pg   MCHC 32.0 30.0 - 36.0 g/dL   RDW 16.3 (H) 11.5 - 15.5 %   Platelets 80 (L) 150 - 400 K/uL    Comment: REPEATED TO VERIFY PLATELET COUNT CONFIRMED BY SMEAR Immature Platelet Fraction may be clinically indicated, consider ordering this additional test [id number]    nRBC 1.0 (H) 0.0 - 0.2 %    Comment: Performed at [facility], [place] [address], [place], [place] [address]  Heparin level (unfractionated)     Status: Abnormal   Collection Time: 03/12/20  3:00 AM  Result Value Ref Range   Heparin Unfractionated 1.98 (H) 0.30 - 0.70 IU/mL    Comment: RESULTS CONFIRMED BY MANUAL DILUTION Performed at [facility], [place] [address], [place], [place] [address]   APTT     Status: Abnormal   Collection Time: 03/12/20  3:00 AM  Result Value Ref Range   aPTT 87 (H) 24 - 36 seconds    Comment:        IF BASELINE aPTT IS ELEVATED, SUGGEST PATIENT RISK ASSESSMENT BE USED TO DETERMINE APPROPRIATE ANTICOAGULANT THERAPY. Performed at [facility], [place] [address] [place], [place] [address]   Magnesium     Status: Abnormal   Collection Time: 03/12/20  3:00 AM  Result Value Ref Range   Magnesium 2.5 (H) 1.7 - 2.4 mg/dL    Comment: Performed at [facility] [address], [place], [place] [address]  Renal function panel (daily at 0500)     Status: Abnormal   Collection Time: 03/12/20  3:01 AM  Result Value Ref Range   Sodium 138 135 - 145 mmol/L   Potassium 4.2 3.5 - 5.1 mmol/L   Chloride 102 98 - 111 mmol/L   CO2 22 22 - 32 mmol/L   Glucose, Bld 271 (H) 70 - 99 mg/dL    Comment: Glucose reference range applies only to samples taken after fasting for at least 8 hours.   BUN 24 (H) 8 - 23 mg/dL   Creatinine, Ser 1.83 (H) 0.44 - 1.00 mg/dL   Calcium 8.6 (L) 8.9 - 10.3 mg/dL   Phosphorus 2.4 (L) 2.5 - 4.6 mg/dL  Albumin 3.0 (L) 3.5 - 5.0 g/dL   GFR calc non Af Amer 28 (L) >60 mL/min   GFR calc Af Amer 32 (L)  >60 mL/min   Anion gap 14 5 - 15    Comment: Performed at [facility] [address] [place], [place] [address]  Glucose, capillary     Status: Abnormal   Collection Time: 03/12/20  7:58 AM  Result Value Ref Range   Glucose-Capillary 197 (H) 70 - 99 mg/dL    Comment: Glucose reference range applies only to samples taken after fasting for at least 8 hours.  Protime-INR     Status: Abnormal   Collection Time: 03/12/20 10:58 AM  Result Value Ref Range   Prothrombin Time 29.1 (H) 11.4 - 15.2 seconds   INR 2.9 (H) 0.8 - 1.2    Comment: (NOTE) INR goal varies based on device and disease states. Performed at [facility], [place] [address] [place], [place] [address]   Glucose, capillary     Status: Abnormal   Collection Time: 03/12/20 11:21 AM  Result Value Ref Range   Glucose-Capillary 272 (H) 70 - 99 mg/dL    Comment: Glucose reference range applies only to samples taken after fasting for at least 8 hours.  Glucose, capillary     Status: Abnormal   Collection Time: 03/12/20  2:52 PM  Result Value Ref Range   Glucose-Capillary 367 (H) 70 - 99 mg/dL    Comment: Glucose reference range applies only to samples taken after fasting for at least 8 hours.  Fibrinogen     Status: None   Collection Time: 03/12/20  3:21 PM  Result Value Ref Range   Fibrinogen 400 210 - 475 mg/dL    Comment: Performed at [facility] [address], [place], [place] [address]  CBC     Status: Abnormal   Collection Time: 03/12/20  3:21 PM  Result Value Ref Range   WBC 11.0 (H) 4.0 - 10.5 K/uL   RBC 2.38 (L) 3.87 - 5.11 MIL/uL   Hemoglobin 8.5 (L) 12.0 - 15.0 g/dL   HCT 26.7 (L) 36 - 46 %   MCV 112.2 (H) 80.0 - 100.0 fL   MCH 35.7 (H) 26.0 - 34.0 pg   MCHC 31.8 30.0 - 36.0 g/dL   RDW 16.2 (H) 11.5 - 15.5 %   Platelets 48 (L) 150 - 400 K/uL    Comment: REPEATED TO VERIFY Immature Platelet Fraction may be clinically indicated, consider ordering this additional test [id number] CONSISTENT  WITH PREVIOUS RESULT    nRBC 1.0 (H) 0.0 - 0.2 %    Comment: Performed at [facility], [place] [address], [place], [place] [address]  APTT     Status: Abnormal   Collection Time: 03/12/20  3:21 PM  Result Value Ref Range   aPTT 45 (H) 24 - 36 seconds    Comment:        IF BASELINE aPTT IS ELEVATED, SUGGEST PATIENT RISK ASSESSMENT BE USED TO DETERMINE APPROPRIATE ANTICOAGULANT THERAPY. Performed at [facility], [place] [address], [place], [place] [address]   Prepare Pheresed Platelets     Status: None (Preliminary result)   Collection Time: 03/12/20  5:01 PM  Result Value Ref Range   Unit Number [id number]    Blood Component Type PLTP1 PSORALEN TREATED    Unit division 00    Status of Unit ISSUED    Transfusion Status OK TO TRANSFUSE  Unit Number [id number]    Blood Component Type PLTP1 PSORALEN TREATED    Unit division 00    Status of Unit REL FROM [facility]    Transfusion Status      OK TO TRANSFUSE Performed at [facility], [place] [address], [place], [place] [address]    Unit Number [id number]    Blood Component Type PLTP1 PSORALEN TREATED    Unit division 00    Status of Unit REL FROM [facility]    Transfusion Status OK TO TRANSFUSE   Prepare fresh frozen plasma     Status: None (Preliminary result)   Collection Time: 03/12/20  5:06 PM  Result Value Ref Range   Unit Number [id number]    Blood Component Type THAWED PLASMA    Unit division 00    Status of Unit ISSUED    Transfusion Status OK TO TRANSFUSE    Unit Number [id number]    Blood Component Type THAWED PLASMA    Unit division 00    Status of Unit ISSUED    Transfusion Status      OK TO TRANSFUSE Performed at [facility], [place] [address] [place], [place] [address]    Unit Number [id number]    Blood Component Type THAWED PLASMA    Unit division 00    Status of Unit ALLOCATED    Transfusion Status OK TO TRANSFUSE   Type and screen [place]     Status:  None   Collection Time: 03/12/20  5:46 PM  Result Value Ref Range   ABO/RH(D) A POS    Antibody Screen NEG    Sample Expiration      03/15/2020,2359 Performed at [facility], [place] [address] [place], [place] [address]   Glucose, capillary     Status: Abnormal   Collection Time: 03/12/20  9:50 PM  Result Value Ref Range   Glucose-Capillary 259 (H) 70 - 99 mg/dL    Comment: Glucose reference range applies only to samples taken after fasting for at least 8 hours.  CBC     Status: Abnormal   Collection Time: 03/13/20  2:14 AM  Result Value Ref Range   WBC 8.6 4.0 - 10.5 K/uL   RBC 2.44 (L) 3.87 - 5.11 MIL/uL   Hemoglobin 8.8 (L) 12.0 - 15.0 g/dL   HCT 27.7 (L) 36 - 46 %   MCV 113.5 (H) 80.0 - 100.0 fL   MCH 36.1 (H) 26.0 - 34.0 pg   MCHC 31.8 30.0 - 36.0 g/dL   RDW 16.2 (H) 11.5 - 15.5 %   Platelets 46 (L) 150 - 400 K/uL    Comment: REPEATED TO VERIFY Immature Platelet Fraction may be clinically indicated, consider ordering this additional test [id number] CONSISTENT WITH PREVIOUS RESULT    nRBC 2.1 (H) 0.0 - 0.2 %    Comment: Performed at [facility], [address] [place], [place] [address]  Heparin level (unfractionated)     Status: Abnormal   Collection Time: 03/13/20  2:14 AM  Result Value Ref Range   Heparin Unfractionated 1.22 (H) 0.30 - 0.70 IU/mL    Comment: RESULTS CONFIRMED BY MANUAL DILUTION (NOTE) If heparin results are below expected values, and patient dosage has  been confirmed, suggest follow up testing of antithrombin III levels. Performed at [facility], [place] [address], [place], [place] [address]   APTT     Status: Abnormal   Collection Time: 03/13/20  2:14  AM  Result Value Ref Range   aPTT 40 (H) 24 - 36 seconds    Comment:        IF BASELINE aPTT IS ELEVATED, SUGGEST PATIENT RISK ASSESSMENT BE USED TO DETERMINE APPROPRIATE ANTICOAGULANT THERAPY. Performed at [facility], [place] [address], [place], [place] [address]    Renal function panel (daily at 0500)     Status: Abnormal   Collection Time: 03/13/20  2:14 AM  Result Value Ref Range   Sodium 135 135 - 145 mmol/L   Potassium 4.0 3.5 - 5.1 mmol/L   Chloride 100 98 - 111 mmol/L   CO2 23 22 - 32 mmol/L   Glucose, Bld 252 (H) 70 - 99 mg/dL    Comment: Glucose reference range applies only to samples taken after fasting for at least 8 hours.   BUN 32 (H) 8 - 23 mg/dL   Creatinine, Ser 2.21 (H) 0.44 - 1.00 mg/dL   Calcium 8.7 (L) 8.9 - 10.3 mg/dL   Phosphorus 2.3 (L) 2.5 - 4.6 mg/dL   Albumin 3.0 (L) 3.5 - 5.0 g/dL   GFR calc non Af Amer 22 (L) >60 mL/min   GFR calc Af Amer 26 (L) >60 mL/min   Anion gap 12 5 - 15    Comment: Performed at [facility] [address], [place], [place] [address]  Hepatic function panel     Status: Abnormal   Collection Time: 03/13/20  2:14 AM  Result Value Ref Range   Total Protein 5.2 (L) 6.5 - 8.1 g/dL   Albumin 3.0 (L) 3.5 - 5.0 g/dL   AST 1,563 (H) 15 - 41 U/L   ALT 2,005 (H) 0 - 44 U/L   Alkaline Phosphatase 140 (H) 38 - 126 U/L   Total Bilirubin 1.4 (H) 0.3 - 1.2 mg/dL   Bilirubin, Direct 0.4 (H) 0.0 - 0.2 mg/dL   Indirect Bilirubin 1.0 (H) 0.3 - 0.9 mg/dL    Comment: Performed at [facility] [address], [place], [place] [address]  Lactic acid, plasma     Status: Abnormal   Collection Time: 03/13/20  3:51 AM  Result Value Ref Range   Lactic Acid, Venous 2.7 (HH) 0.5 - 1.9 mmol/L    Comment: CRITICAL VALUE NOTED.  VALUE IS CONSISTENT WITH PREVIOUSLY REPORTED AND CALLED VALUE. Performed at [facility], [place] [address], [place], [place] [address]   Procalcitonin - Baseline     Status: None   Collection Time: 03/13/20  3:51 AM  Result Value Ref Range   Procalcitonin 7.43 ng/mL    Comment:        Interpretation: PCT > 2 ng/mL: Systemic infection (sepsis) is likely, unless other causes are known. (NOTE)       Sepsis PCT Algorithm           Lower Respiratory Tract                                       Infection PCT Algorithm    ----------------------------     ----------------------------         PCT < 0.25 ng/mL                PCT < 0.10 ng/mL          Strongly encourage             Strongly  discourage   discontinuation of antibiotics    initiation of antibiotics    ----------------------------     -----------------------------       PCT 0.25 - 0.50 ng/mL            PCT 0.10 - 0.25 ng/mL               OR       >80% decrease in PCT            Discourage initiation of                                            antibiotics      Encourage discontinuation           of antibiotics    ----------------------------     -----------------------------         PCT >= 0.50 ng/mL              PCT 0.26 - 0.50 ng/mL               AND       <80% decrease in PCT              Encourage initiation of                                             antibiotics       Encourage continuation           of antibiotics    ----------------------------     -----------------------------        PCT >= 0.50 ng/mL                  PCT > 0.50 ng/mL               AND         increase in PCT                  Strongly encourage                                      initiation of antibiotics    Strongly encourage escalation           of antibiotics                                     -----------------------------                                           PCT <= 0.25 ng/mL                                                 OR                                        >  80% decrease in PCT                                      Discontinue / Do not initiate                                             antibiotics  Performed at [facility], [place] [address], [place], [place] [address]   Ammonia     Status: Abnormal   Collection Time: 03/13/20  3:52 AM  Result Value Ref Range   Ammonia 50 (H) 9 - 35 umol/L    Comment: Performed at [facility], [place] [address], [place], [place] [address]  Glucose,  capillary     Status: Abnormal   Collection Time: 03/13/20  6:16 AM  Result Value Ref Range   Glucose-Capillary 240 (H) 70 - 99 mg/dL    Comment: Glucose reference range applies only to samples taken after fasting for at least 8 hours.    Recent Results (from the past 240 hour(s))  SARS Coronavirus 2 by RT PCR (hospital order, performed in [facility] hospital lab) Nasopharyngeal Nasopharyngeal Swab     Status: None   Collection Time: 03/07/20  1:13 AM   Specimen: Nasopharyngeal Swab  Result Value Ref Range Status   SARS Coronavirus 2 NEGATIVE NEGATIVE Final    Comment: (NOTE) SARS-CoV-2 target nucleic acids are NOT DETECTED.  The SARS-CoV-2 RNA is generally detectable in upper and lower respiratory specimens during the acute phase of infection. The lowest concentration of SARS-CoV-2 viral copies this assay can detect is 250 copies / mL. A negative result does not preclude SARS-CoV-2 infection and should not be used as the sole basis for treatment or other patient management decisions.  A negative result may occur with improper specimen collection / handling, submission of specimen other than nasopharyngeal swab, presence of viral mutation(s) within the areas targeted by this assay, and inadequate number of viral copies (<250 copies / mL). A negative result must be combined with clinical observations, patient history, and epidemiological information.  Fact Sheet for Patients:   StrictlyIdeas.no  Fact Sheet for Healthcare Providers: BankingDealers.co.za  This test is not yet approved or  cleared by the [place] FDA and has been authorized for detection and/or diagnosis of SARS-CoV-2 by FDA under an Emergency Use Authorization (EUA).  This EUA will remain in effect (meaning this test can be used) for the duration of the COVID-19 declaration under Section 564(b)(1) of the Act, 21 U.S.C. section 360bbb-3(b)(1), unless the  authorization is terminated or revoked sooner.  Performed at [facility], [address] [place], [place] [address]   SARS Coronavirus 2 by RT PCR (hospital order, performed in [facility] hospital lab) Nasopharyngeal Nasopharyngeal Swab     Status: None   Collection Time: 03/10/20 10:32 AM   Specimen: Nasopharyngeal Swab  Result Value Ref Range Status   SARS Coronavirus 2 NEGATIVE NEGATIVE Final    Comment: (NOTE) SARS-CoV-2 target nucleic acids are NOT DETECTED.  The SARS-CoV-2 RNA is generally detectable in upper and lower respiratory specimens during the acute phase of infection. The lowest concentration of SARS-CoV-2 viral copies this assay can detect is 250 copies / mL. A negative result does not preclude SARS-CoV-2 infection and should not be used  as the sole basis for treatment or other patient management decisions.  A negative result may occur with improper specimen collection / handling, submission of specimen other than nasopharyngeal swab, presence of viral mutation(s) within the areas targeted by this assay, and inadequate number of viral copies (<250 copies / mL). A negative result must be combined with clinical observations, patient history, and epidemiological information.  Fact Sheet for Patients:   StrictlyIdeas.no  Fact Sheet for Healthcare Providers: BankingDealers.co.za  This test is not yet approved or  cleared by the [place] FDA and has been authorized for detection and/or diagnosis of SARS-CoV-2 by FDA under an Emergency Use Authorization (EUA).  This EUA will remain in effect (meaning this test can be used) for the duration of the COVID-19 declaration under Section 564(b)(1) of the Act, 21 U.S.C. section 360bbb-3(b)(1), unless the authorization is terminated or revoked sooner.  Performed at [facility], [place] [address] [place], [place] [address]   Blood Culture (routine x 2)     Status:  Abnormal   Collection Time: 03/10/20 11:12 AM   Specimen: BLOOD  Result Value Ref Range Status   Specimen Description BLOOD LEFT ANTECUBITAL  Final   Special Requests   Final    BOTTLES DRAWN AEROBIC AND ANAEROBIC Blood Culture adequate volume   Culture  Setup Time   Final    GRAM POSITIVE COCCI IN CHAINS IN BOTH AEROBIC AND ANAEROBIC BOTTLES Organism ID to follow CRITICAL RESULT CALLED TO, READ BACK BY AND VERIFIED WITH: PHARMD VERANDA BRICK AT [place] ON 03/11/2020 Performed at [facility], [place] [address] [place],  [address]    Culture ENTEROCOCCUS FAECALIS (A)  Final   Report Status 03/12/2020 FINAL  Final   Organism ID, Bacteria ENTEROCOCCUS FAECALIS  Final      Susceptibility   Enterococcus faecalis - MIC*    AMPICILLIN <=2 SENSITIVE Sensitive     VANCOMYCIN 1 SENSITIVE Sensitive     GENTAMICIN SYNERGY SENSITIVE Sensitive     * ENTEROCOCCUS FAECALIS  Blood Culture ID Panel (Reflexed)     Status: Abnormal   Collection Time: 03/10/20 11:12 AM  Result Value Ref Range Status   Enterococcus species DETECTED (A) NOT DETECTED Final    Comment: CRITICAL RESULT CALLED TO, READ BACK BY AND VERIFIED WITH: PHARMD VERANDA BRICK AT 0052 BY MESSAN HOUEGNIFIO ON 03/11/2020    Vancomycin resistance NOT DETECTED NOT DETECTED Final   Listeria monocytogenes NOT DETECTED NOT DETECTED Final   Staphylococcus species NOT DETECTED NOT DETECTED Final   Staphylococcus aureus (BCID) NOT DETECTED NOT DETECTED Final   Streptococcus species NOT DETECTED NOT DETECTED Final   Streptococcus agalactiae NOT DETECTED NOT DETECTED Final   Streptococcus pneumoniae NOT DETECTED NOT DETECTED Final   Streptococcus pyogenes NOT DETECTED NOT DETECTED Final   Acinetobacter baumannii NOT DETECTED NOT DETECTED Final   Enterobacteriaceae species NOT DETECTED NOT DETECTED Final   Enterobacter cloacae complex NOT DETECTED NOT DETECTED Final   Escherichia coli NOT DETECTED NOT DETECTED  Final   Klebsiella oxytoca NOT DETECTED NOT DETECTED Final   Klebsiella pneumoniae NOT DETECTED NOT DETECTED Final   Proteus species NOT DETECTED NOT DETECTED Final   Serratia marcescens NOT DETECTED NOT DETECTED Final   Haemophilus influenzae NOT DETECTED NOT DETECTED Final   Neisseria meningitidis NOT DETECTED NOT DETECTED Final   Pseudomonas aeruginosa NOT DETECTED NOT DETECTED Final   Candida albicans NOT DETECTED NOT DETECTED Final   Candida glabrata NOT  DETECTED NOT DETECTED Final   Candida krusei NOT DETECTED NOT DETECTED Final   Candida parapsilosis NOT DETECTED NOT DETECTED Final   Candida tropicalis NOT DETECTED NOT DETECTED Final    Comment: Performed at [facility], [place] [address], [place], [place] [address]  Blood Culture (routine x 2)     Status: Abnormal   Collection Time: 03/10/20 11:35 AM   Specimen: BLOOD LEFT FOREARM  Result Value Ref Range Status   Specimen Description BLOOD LEFT FOREARM  Final   Special Requests   Final    BOTTLES DRAWN AEROBIC AND ANAEROBIC Blood Culture adequate volume   Culture  Setup Time   Final    GRAM POSITIVE COCCI IN CHAINS IN BOTH AEROBIC AND ANAEROBIC BOTTLES CRITICAL VALUE NOTED.  VALUE IS CONSISTENT WITH PREVIOUSLY REPORTED AND CALLED VALUE.    Culture (A)  Final    ENTEROCOCCUS FAECALIS SUSCEPTIBILITIES PERFORMED ON PREVIOUS CULTURE WITHIN THE LAST 5 DAYS. Performed at [facility], [place] [address], [place], [place] [address]    Report Status 03/12/2020 FINAL  Final  MRSA PCR Screening     Status: Abnormal   Collection Time: 03/10/20  6:08 PM   Specimen: Nasopharyngeal  Result Value Ref Range Status   MRSA by PCR (A) NEGATIVE Final    INVALID, UNABLE TO DETERMINE THE PRESENCE OF TARGET DUE TO SPECIMEN INTEGRITY. RECOLLECTION REQUESTED.    Comment: A,KOTEY @2145  03/10/20 EB Performed at [facility] [address] [place], [place] [address]   MRSA PCR Screening     Status: None   Collection Time: 03/11/20   8:17 AM   Specimen: Nasal Mucosa; Nasopharyngeal  Result Value Ref Range Status   MRSA by PCR NEGATIVE NEGATIVE Final    Comment:        The GeneXpert MRSA Assay (FDA approved for NASAL specimens only), is one component of a comprehensive MRSA colonization surveillance program. It is not intended to diagnose MRSA infection nor to guide or monitor treatment for MRSA infections. Performed at [facility], [place] [address], [place], [place] [address]   Urine culture     Status: Abnormal   Collection Time: 03/11/20  9:17 AM   Specimen: In/Out Cath Urine  Result Value Ref Range Status   Specimen Description IN/OUT CATH URINE  Final   Special Requests   Final    NONE Performed at [facility], [place] [address], [place], [place] [address]    Culture MULTIPLE SPECIES PRESENT, SUGGEST RECOLLECTION (A)  Final   Report Status 03/12/2020 FINAL  Final    Lipid Panel Recent Labs    03/11/20 0335  CHOL 104  TRIG 56  HDL 51  CHOLHDL 2.0  VLDL 11  LDLCALC 42    Studies/Results: EEG  Result Date: 03/12/2020 Lora Havens, MD     03/12/2020 10:19 AM Patient Name: [name] MRN: [id number] Epilepsy Attending: Lora Havens Referring Physician/Provider: Dr. Amie [place] Date: 03/12/2020 Duration: 24.37 minutes Patient history: 69 year old female with past medical history of catatonia, schizoaffective disorder as well as history of seizures who was noted to have a blank stare with some chewing movements of the mouth and not responding to questions lasting for about 15 minutes.  EEG to evaluate for seizures. Level of alertness: Awake AEDs during EEG study: LEV Technical aspects: This EEG study was done with scalp electrodes positioned according to the 10-20 International system of electrode placement. Electrical activity was acquired at  a sampling rate of 500Hz  and reviewed with a high frequency filter of 70Hz  and a low frequency filter of 1Hz . EEG data were recorded  continuously and digitally stored. Description: The posterior dominant rhythm consists of 6 Hz activity of moderate voltage (25-35 uV) seen predominantly in posterior head regions, symmetric and reactive to eye opening and eye closing. EEG showed continuous generalized 5 to 6 Hz theta slowing. Hyperventilation and photic stimulation were not performed.   ABNORMALITY -Continuous slow, generalized -Background slow IMPRESSION: This study is suggestive of moderate diffuse encephalopathy, nonspecific etiology. No seizures or epileptiform discharges were seen throughout the recording. Lora Havens   MR BRAIN WO CONTRAST  Result Date: 03/12/2020 CLINICAL DATA:  69 year old female with altered mental status. On anticoagulation for atrial fibrillation. EXAM: MRI HEAD WITHOUT CONTRAST TECHNIQUE: Multiplanar, multiecho pulse sequences of the brain and surrounding structures were obtained without intravenous contrast. COMPARISON:  Head CT yesterday.  Brain MRI 09/27/2018. FINDINGS: Brain: Stable cerebral volume. No restricted diffusion to suggest acute infarction. No midline shift, mass effect, evidence of mass lesion, ventriculomegaly, extra-axial collection or acute intracranial hemorrhage. Cervicomedullary junction and pituitary are within normal limits. Stable minimal nonspecific cerebral white matter T2 and FLAIR hyperintensity since 2020. Suggestion of a small chronic focus of encephalomalacia along the medial cerebellum near the vermis (series 6, image 7). No other cortical encephalomalacia. No chronic cerebral blood products are evident. Deep gray nuclei and brainstem remain normal. Vascular: Major intracranial vascular flow voids are stable since last year. Skull and upper cervical spine: Negative. Sinuses/Orbits: Stable and negative orbits. Small maxillary sinus mucous retention cysts are stable. Other: Trace mastoid fluid is stable since last year. Grossly normal visible internal auditory structures. Scalp  and face soft tissues appear negative. IMPRESSION: 1.  No acute intracranial abnormality. 2. 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 Electronically Signed   By: Genevie Ann M.D.   On: 03/12/2020 15:09   DG CHEST PORT 1 VIEW  Result Date: 03/12/2020 CLINICAL DATA:  Pleural effusion. EXAM: PORTABLE CHEST 1 VIEW COMPARISON:  Radiograph 03/10/2020.  Most recent CT 01/23/2020 FINDINGS: Right subclavian central catheter tip in the lower SVC. Lateral portion of catheter is looped. Left-sided dialysis catheter tip in the lower SVC. Hazy opacity at the right lung base likely represents pleural effusion. There is no visualized pneumothorax. Stable cardiomegaly. Vascular congestion. Interstitial opacities may represent pulmonary edema. IMPRESSION: 1. Right subclavian central catheter tip in the lower SVC. Lateral portion of catheter is looped, this portion may be external to the patient. 2. Hazy opacity at the right lung base likely represents pleural effusion. 3. Stable cardiomegaly, vascular congestion and interstitial opacities, suggesting pulmonary edema. Electronically Signed   By: Keith Rake M.D.   On: 03/12/2020 17:31   ECHOCARDIOGRAM COMPLETE  Result Date: 03/11/2020    ECHOCARDIOGRAM REPORT   Patient Name:   [name] Date of Exam: 03/11/2020 Medical Rec #:  [id number]        Height:       62.0 in Accession #:    [id number]       Weight:       193.1 lb Date of Birth:  12/17/1950        BSA:          1.883 m Patient Age:    80 years         BP:  90/41 mmHg Patient Gender: F                HR:           107 bpm. Exam Location:  Inpatient Procedure: 2D Echo, 3D Echo, Color Doppler and Cardiac Doppler STAT ECHO Indications:    Shock  History:        Patient has prior history of Echocardiogram examinations, most                 recent 11/23/2019. CHF, Arrythmias:Atrial Fibrillation; Risk                  Factors:Hypertension, Diabetes and Dyslipidemia.  Sonographer:    Raquel Sarna Senior RDCS Referring Phys: [phone] GRACE E BOWSER  Sonographer Comments: Scanned supine on bipap. IMPRESSIONS  1. Challenging to estimate EF with underlying tachycardia, however does not appear severly reduced.  2. Left ventricular ejection fraction, by estimation, is 45 to 50%. The left ventricle has mildly decreased function. The left ventricle demonstrates global hypokinesis. Left ventricular diastolic parameters are indeterminate.  3. Right ventricular systolic function is mildly reduced. The right ventricular size is moderately enlarged. There is moderately elevated pulmonary artery systolic pressure. The estimated right ventricular systolic pressure is 70.6 mmHg.  4. Left atrial size was severely dilated.  5. Right atrial size was moderately dilated.  6. The mitral valve is normal in structure. Mild mitral valve regurgitation. No evidence of mitral stenosis.  7. Tricuspid valve regurgitation is moderate to severe.  8. The aortic valve is tricuspid. Aortic valve regurgitation is not visualized. Mild to moderate aortic valve sclerosis/calcification is present, without any evidence of aortic stenosis.  9. The inferior vena cava is dilated in size with <50% respiratory variability, suggesting right atrial pressure of 15 mmHg. FINDINGS  Left Ventricle: Left ventricular ejection fraction, by estimation, is 45 to 50%. The left ventricle has mildly decreased function. The left ventricle demonstrates global hypokinesis. The left ventricular internal cavity size was normal in size. There is  no left ventricular hypertrophy. Left ventricular diastolic parameters are indeterminate. Right Ventricle: The right ventricular size is moderately enlarged. No increase in right ventricular wall thickness. Right ventricular systolic function is mildly reduced. There is moderately elevated pulmonary artery systolic pressure. The tricuspid regurgitant  velocity is 3.11 m/s, and with an assumed right atrial pressure of 15 mmHg, the estimated right ventricular systolic pressure is 23.7 mmHg. Left Atrium: Left atrial size was severely dilated. Right Atrium: Right atrial size was moderately dilated. Pericardium: There is no evidence of pericardial effusion. Mitral Valve: The mitral valve is normal in structure. Normal mobility of the mitral valve leaflets. Moderate mitral annular calcification. Mild mitral valve regurgitation. No evidence of mitral valve stenosis. Tricuspid Valve: The tricuspid valve is normal in structure. Tricuspid valve regurgitation is moderate to severe. No evidence of tricuspid stenosis. Aortic Valve: The aortic valve is tricuspid. Aortic valve regurgitation is not visualized. Mild to moderate aortic valve sclerosis/calcification is present, without any evidence of aortic stenosis. Pulmonic Valve: The pulmonic valve was normal in structure. Pulmonic valve regurgitation is mild. No evidence of pulmonic stenosis. Aorta: The aortic root is normal in size and structure. Venous: The inferior vena cava is dilated in size with less than 50% respiratory variability, suggesting right atrial pressure of 15 mmHg. IAS/Shunts: No atrial level shunt detected by color flow Doppler. Additional Comments: Challenging to estimate EF with underlying tachycardia, however does not appear severly reduced.  LEFT VENTRICLE PLAX 2D LVIDd:  5.30 cm LVIDs:         4.40 cm LV PW:         1.00 cm LV IVS:        0.90 cm LVOT diam:     2.10 cm LV SV:         45 LV SV Index:   24 LVOT Area:     3.46 cm  RIGHT VENTRICLE RV S prime:     4.24 cm/s TAPSE (M-mode): 1.0 cm LEFT ATRIUM              Index       RIGHT ATRIUM           Index LA diam:        5.20 cm  2.76 cm/m  RA Area:     22.50 cm LA Vol (A2C):   145.0 ml 76.99 ml/m RA Volume:   70.50 ml  37.43 ml/m LA Vol (A4C):   142.0 ml 75.39 ml/m LA Biplane Vol: 145.0 ml 76.99 ml/m  AORTIC VALVE LVOT Vmax:   64.00  cm/s LVOT Vmean:  45.200 cm/s LVOT VTI:    0.130 m  AORTA Ao Root diam: 2.70 cm Ao Asc diam:  3.10 cm TRICUSPID VALVE TR Peak grad:   38.7 mmHg TR Vmax:        311.00 cm/s  SHUNTS Systemic VTI:  0.13 m Systemic Diam: 2.10 cm Candee Furbish MD Electronically signed by Candee Furbish MD Signature Date/Time: 03/11/2020/10:45:09 AM    Final    CT HEAD CODE STROKE WO CONTRAST  Result Date: 03/11/2020 CLINICAL DATA:  Code stroke.  69 year old female code stroke. EXAM: CT HEAD WITHOUT CONTRAST TECHNIQUE: Contiguous axial images were obtained from the base of the skull through the vertex without intravenous contrast. COMPARISON:  Head CT 03/07/2020.  Brain MRI 09/27/2018. FINDINGS: Brain: Gray-white matter differentiation is stable and normal for age throughout the brain. No midline shift, ventriculomegaly, mass effect, evidence of mass lesion, intracranial hemorrhage or evidence of cortically based acute infarction. Vascular: Calcified atherosclerosis at the skull base. No suspicious intracranial vascular hyperdensity. Skull: No acute osseous abnormality identified. Sinuses/Orbits: Visualized paranasal sinuses and mastoids are stable and well pneumatized. Other: No acute orbit or scalp soft tissue finding. ASPECTS [facility] Stroke Program Early CT Score) Total score (0-10 with 10 being normal): 10 IMPRESSION: 1. Stable and normal for age non contrast CT appearance of the brain. ASPECTS 10. 2. These results were communicated to Dr. Rory Percy at 10:19 pm on 03/11/2020 by text page via the [facility] messaging system. Electronically Signed   By: Genevie Ann M.D.   On: 03/11/2020 22:19    Medications:  Scheduled: . ARIPiprazole  2 mg Oral Daily  . aspirin EC  81 mg Oral Q breakfast  . atorvastatin  20 mg Oral QHS  . calcitRIOL  0.25 mcg Oral Q M,W,F  . calcium acetate  2,001 mg Oral TID WC  . Chlorhexidine Gluconate Cloth  6 each Topical Daily  . feeding supplement (ENSURE ENLIVE)  237 mL Oral BID BM  . insulin aspart  0-15 Units  Subcutaneous TID WC  . insulin aspart  2 Units Subcutaneous TID WC  . insulin detemir  5 Units Subcutaneous Daily  . levETIRAcetam  250 mg Oral BID  . levothyroxine  50 mcg Oral QAC breakfast  . linezolid  600 mg Oral Q12H  . mouth rinse  15 mL Mouth Rinse BID  . metoprolol tartrate  12.5 mg Oral BID  .  montelukast  10 mg Oral QHS  . multivitamin  1 tablet Oral QHS  . pantoprazole  40 mg Oral Daily  . primidone  50 mg Oral Daily  . sodium chloride flush  10-40 mL Intracatheter Q12H  . Thrombi-Pad  1 each Topical Once   Continuous: . sodium chloride    . amiodarone 30 mg/hr (03/13/20 0600)  . heparin Stopped (03/12/20 1505)  . phenylephrine (NEO-SYNEPHRINE) Adult infusion Stopped (03/12/20 1435)   Laurey Morale, MSN, NP-C [facility] [phone]   Assessment: 69 yo F with episode of AMS of unclear etiology. DDx includes partial seizure and psychiatric cause, TIA also possible.   1. Exam today appears somewhat improved from previous. Able to follow some simple commands. Oriented to name/year/age; able to raise all 4 extremities anti-gravity. 2. EEG: Continuous generalized slowing. The findings are suggestive of moderate diffuse encephalopathy, nonspecific with regard to etiology. No seizures or epileptiform discharges were seen throughout the recording. 3. MRI brain: No acute intracranial abnormality. 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 4. As EEG confirms an encephalopathy, her presentation is most likely not psychiatric in etiology. Partial seizure remains a possibility as she has a history of epilepsy and an EEG without epileptiform discharges does not rule out recent seizure. Overall clinical picture is less consistent with TIA and acute stroke has been ruled out with MRI.   Recommendations: 1. Continue home primidone and Keppra. Per Dr. Leonel Ramsay, would  not change anticonvulsant regimen unless EEG is fairly active.  2. She is on anticoagulation for afib, would continue this.   3. Neurology will sign off. Please call if there are additional questions.     LOS: 3 days   @Electronically  signed: Dr. Kerney Elbe 03/13/2020  7:21 AM

## 2020-03-13 NOTE — Progress Notes (Signed)
SecureChat sent to pt's nurse on day shift regarding CL dsg change due today; reminded her to chart dressing change if she completed it or to pass task on to night shift to be completed before midnight.

## 2020-03-13 NOTE — Progress Notes (Signed)
Admit: 03/10/2020 LOS: 3  [name] ESRD with septic shock, AFib/RVR, hyperkalemia, enterococcal bacteremia  Subjective:  . Off pressors . Having some RVR . CRRT stopped yesterday  07/28 0701 - 07/29 0700 In: 1984.7 [P.O.:480; I.V.:501.3; Blood:1003.4] Out: 528 [Urine:1; Stool:1]  Filed Weights   03/11/20 0500 03/12/20 0500 03/13/20 0500  Weight: 87.6 kg 86.9 kg 88.5 kg    Scheduled Meds: . sodium chloride   Intravenous Once  . ARIPiprazole  2 mg Oral Daily  . aspirin EC  81 mg Oral Q breakfast  . atorvastatin  20 mg Oral QHS  . buPROPion  150 mg Oral Daily  . calcitRIOL  0.25 mcg Oral Q M,W,F  . calcium acetate  2,001 mg Oral TID WC  . Chlorhexidine Gluconate Cloth  6 each Topical Daily  . feeding supplement (ENSURE ENLIVE)  237 mL Oral BID BM  . insulin aspart  0-15 Units Subcutaneous TID WC  . insulin aspart  2 Units Subcutaneous TID WC  . insulin detemir  5 Units Subcutaneous Daily  . lactulose  10 g Oral BID  . levETIRAcetam  250 mg Oral BID  . levothyroxine  50 mcg Oral QAC breakfast  . mouth rinse  15 mL Mouth Rinse BID  . metoprolol tartrate  25 mg Oral BID  . montelukast  10 mg Oral QHS  . multivitamin  1 tablet Oral QHS  . pantoprazole  40 mg Oral Daily  . [START ON 03/14/2020] predniSONE  10 mg Oral Q breakfast  . primidone  50 mg Oral Daily  . sodium chloride flush  10-40 mL Intracatheter Q12H  . Thrombi-Pad  1 each Topical Once  . traZODone  50 mg Oral QHS   Continuous Infusions: . sodium chloride    . amiodarone 30 mg/hr (03/13/20 1000)  . ampicillin (OMNIPEN) IV 2 g (03/13/20 1043)  . heparin Stopped (03/12/20 1505)  . phenylephrine (NEO-SYNEPHRINE) Adult infusion Stopped (03/12/20 1435)   PRN Meds:.acetaminophen, albuterol, calcium carbonate, diphenhydrAMINE, docusate sodium, EPINEPHrine, heparin, nitroGLYCERIN, ondansetron (ZOFRAN) IV, sodium chloride flush  Current Labs: reviewed    Physical Exam:  Blood pressure (!) 126/62, pulse 104, temperature  98.6 F (37 C), temperature source Oral, resp. rate 16, height 5\' 2"  (1.575 m), weight 88.5 kg, SpO2 100 %. Chronically ill appearing Tachycardic, irregular, no rub Coarse breath sounds bilaterally Left IJ TDC C/D/I Right upper arm AV graft with bruit and thrill Soft, nontender  [facility] Bath2/2.5, Dialyzerunknown, Heparindoes not appear to receive heparin with her treatment, only in the catheter. Accessusing TDC.   A 1. ESRD [facility] 1. CRRT 7/26 - 7/28 2. iHD tomorrow: 3.5h, 2K, 2L UF, no heparin, use RUE AVG 16g 350/600 2. Septic shock 2/2 enterococcal bacteremia, resolved shock; not VRE; RCID following, on ampicillin 3.  RUE AVG placed 7/7, appears mature and usable 4. AFib with RVR, amio + systemic heparin, cardiology following 5. HfPEF 6. Hypoxic RF on [facility] 7. AMS improved 8. DM2 9. RA on steroids 10. TCP 11. Worsenign LFTs  P . As above, dialysis tomorrow using AV graft . We will ask IR to remove tunneled HD catheter today . Medication Issues; o Preferred narcotic agents for pain control are hydromorphone, fentanyl, and methadone. Morphine should not be used.  o Baclofen should be avoided o Avoid oral sodium phosphate and magnesium citrate based laxatives / bowel preps    Pearson Grippe MD 03/13/2020, 11:03 AM  Recent Labs  Lab 03/11/20  1517 03/12/20 0301 03/13/20 0214  NA 135 138 135  K 3.8 4.2 4.0  CL 102 102 100  CO2 21* 22 23  GLUCOSE 320* 271* 252*  BUN 25* 24* 32*  CREATININE 1.96* 1.83* 2.21*  CALCIUM 8.3* 8.6* 8.7*  PHOS 2.9 2.4* 2.3*   Recent Labs  Lab 03/07/20 0044 03/10/20 1032 03/12/20 0300 03/12/20 1521 03/13/20 0214  WBC 9.3   < > 15.3* 11.0* 8.6  NEUTROABS 7.0  --   --   --   --   HGB 9.8*   < > 10.4* 8.5* 8.8*  HCT 29.7*   < > 32.5* 26.7* 27.7*  MCV 110.8*   < > 114.0* 112.2* 113.5*  PLT 169   < > 80* 48* 46*   < > = values in this interval not displayed.

## 2020-03-13 NOTE — Progress Notes (Signed)
Penicillin Allergy Note   Allergy Assessment: [name] is a 69 year old female with a listed allergy of swelling to penicillins. When questioned about this allergy she reports as a child she had some lesions on her legs that she remembers appearing after taking penicillin. To her knowledge she cannot recall ever taking penicillin or any penicillin-like medications since this reaction. However, on chart review it does look like she received Augmentin for a diabetic foot ulcer in 2012 and had no reaction to this. There is also a note from this time that the patient endorses being able to tolerate amoxicillin so she might just have some trouble remembering here in the hospital.   Given her history and low risk of severe reaction we administered a one time dose of oral amoxicillin to test her allergy. She has prednisone on her MAR which we held a day prior to this challenge to ensure we did not mask any reactions. She tolerated the challenge with no issues and her antibiotics were changed from linezolid to ampicillin.   Plan  -Remove penicillin allergy -PCP and Pharmacy notified    Nicoletta Dress, PharmD Infectious Disease Pharmacist  Phone: [phone]

## 2020-03-13 NOTE — Progress Notes (Signed)
  Amiodarone Drug - Drug Interaction Consult Note  Recommendations: Monitor Zofran use, Monitor K+ Amiodarone is metabolized by the cytochrome P450 system and therefore has the potential to cause many drug interactions. Amiodarone has an average plasma half-life of 50 days (range 20 to 100 days).   There is potential for drug interactions to occur several weeks or months after stopping treatment and the onset of drug interactions may be slow after initiating amiodarone.   [x]  Statins: Increased risk of myopathy. Simvastatin- restrict dose to 20mg  daily. Other statins: counsel patients to report any muscle pain or weakness immediately.  []  Anticoagulants: Amiodarone can increase anticoagulant effect. Consider warfarin dose reduction. Patients should be monitored closely and the dose of anticoagulant altered accordingly, remembering that amiodarone levels take several weeks to stabilize.  []  Antiepileptics: Amiodarone can increase plasma concentration of phenytoin, the dose should be reduced. Note that small changes in phenytoin dose can result in large changes in levels. Monitor patient and counsel on signs of toxicity.  [x]  Beta blockers: increased risk of bradycardia, AV block and myocardial depression. Sotalol - avoid concomitant use.  []   Calcium channel blockers (diltiazem and verapamil): increased risk of bradycardia, AV block and myocardial depression.  []   Cyclosporine: Amiodarone increases levels of cyclosporine. Reduced dose of cyclosporine is recommended.  []  Digoxin dose should be halved when amiodarone is started.  []  Diuretics: increased risk of cardiotoxicity if hypokalemia occurs.  []  Oral hypoglycemic agents (glyburide, glipizide, glimepiride): increased risk of hypoglycemia. Patient's glucose levels should be monitored closely when initiating amiodarone therapy.   [x]  Drugs that prolong the QT interval:  Torsades de pointes risk may be increased with concurrent use - avoid  if possible.  Monitor QTc, also keep magnesium/potassium WNL if concurrent therapy can't be avoided. [name] Kitchen Antibiotics: e.g. fluoroquinolones, erythromycin. . Antiarrhythmics: e.g. quinidine, procainamide, disopyramide, sotalol. . Antipsychotics: e.g. phenothiazines, haloperidol.  . Lithium, tricyclic antidepressants, and methadone. Thank You,  Narda Bonds  03/13/2020 5:13 AM

## 2020-03-13 NOTE — Plan of Care (Signed)

## 2020-03-13 NOTE — Plan of Care (Signed)
CVICU Event Note  Called for elevated heart rate:  Heart rate has slowly increased from ~ 116 after back on tele 03/12/20 to ~ 135.  Hgb Stable, BP stable.  Lead diagnosis is sepsis. Presently, has elevation in AST ALT increased from 03/10/20; now off pressors.  Patient is warm and well perfused; noted that she is having trouble sleep and perseverates about this.  Bruising around subclavian line site (R).    Will send Pro cal, Lactate, and ammonia.  Heart rate could be reactive; though sepsis appears to be improving.  At this time, no AAD change warranted.  Discussed with patient and nursing; no further questions.  Rudean Haskell MD

## 2020-03-13 NOTE — Progress Notes (Signed)
[facility] Progress Note Patient Name: [name] DOB: 14-Mar-1951 MRN: [id number]   Date of Service  03/13/2020  HPI/Events of Note  New orders for 3 units FFP and 3 units platelets. Dr. Ruthann Cancer notified of the fact that patient is no longer on CRRT, but should be getting HD tomorrow. She stated to only give 2 FFP and 1 platelet before obtaining labs and then readdress orders with CCM docs after labs resulted.  Blood report seen. Stable plat at 40's. Hg stable > 8. PTT 40.  No more active bleeding as per bed side RN. Off of CRRT/HD. - will not give remaining blood products for now. Watch for bleeding. Fibrinogen 400 ok,, elevated AST/ALT.   eICU Interventions  As above     Intervention Category Intermediate Interventions: Diagnostic test evaluation;Coagulopathy - evaluation and management  Elmer Sow 03/13/2020, 3:35 AM

## 2020-03-13 NOTE — Progress Notes (Signed)
2 FFP and 1 Platelet given per order. CBC and PT/INR drawn and results called in to CCM as requested. No more blood products ordered and no new orders at this time. Will continue to monitor.

## 2020-03-13 NOTE — Progress Notes (Signed)
NAME:  ASYAH CANDLER, MRN:  [id number], DOB:  [birth year], LOS: 3 ADMISSION DATE:  03/10/2020, CONSULTATION DATE:  7/26 REFERRING MD:  pahwani, CHIEF COMPLAINT:  afib RVR   Brief History   74 yof admitted to [facility] on 03/10/20 for afib with RVR, decompensated heart failure and septic shock. She developed progressive hypotension and PCCM was consulted for admission to ICU for pressor support.  History of present illness   43 yof with numerous comorbidities . Presented from SNF for atrial fibrillation with HR in the 170s.  Started on diltiazem drip by EMS however upon arrival to the ED, HR remained elevated and diltiazem drip was increased. HR improved with increased rate however she developed progressive hypotension and was transitioned over to amiodarone. Due to continued hypotension, PCCM was consulted for transfer to ICU for pressor support. Chronic hypoxic resp failure on 2L at home Additionally, developed respiratory failure. Workup in the ED revealed an elevated white count and CXR concerning for pna.    Past Medical History  HFpEF, persistent atrial fibrillation, T2DM, RA, hypothyroidism, COPD, ESRD on HD, seizure disorder  Significant Hospital Events   7/26 admission 7/26 transferred to ICU, CRRT initiated, on Neo-Synephrine  Consults:  PCCM Cardiology Nephrology Infectious disease 7/27 Neurology 7/27  Procedures:  RT [place] CVL 7/26 >> Perm cath L 09/27/19 Significant Diagnostic Tests:  7/23 cth: 1. No evidence of acute intracranial abnormality. 2. Mild chronic small vessel ischemic disease. 7/26 abdominal u/s: Status post cholecystectomy. Fatty infiltration of the liver. No acute abnormality in the upper abdomen is seen. Incidental note of right pleural effusion. 7/27echo: LVEF 45-50%, RV with mildly reduced function RVSP 53, biatrial enlargement, TR mod to severe 7/27 cth: 1. Stable and normal for age non contrast CT appearance of the brain. ASPECTS 10.  Micro Data:    7/26 blood cultures>> enterococcus faecalis 7/27 urine: multiple species Antimicrobials:  Vancomycin 7/26>>7/27 linezolid 7/27-> Cefepime 7/26>>7/27  Interim history/subjective:  7/29: cx back with sensitivities and not VRE suspect ID will alter abx. Pt with significant bleeding from subclavian line and new worsening effusion on cxr (repeat one this am pending). Have been trying to reverse coagulopathy and thrombpcytopenia but post transfusion yesterday inr was not ordered and no further plts given despite value <50 and with continued bleeding? Lactate and pct up today. Pt however without complaints. Off pressors, off crrt 7/28: Appears much better. Conversant and appropriate Remains on Neo-Synephrine but titrating. If unable to come off today will increase steroids On CRRT until off pressors.  Recheck INR   Objective   Blood pressure 114/78, pulse (!) 127, temperature 98.6 F (37 C), temperature source Oral, resp. rate (!) 30, height 5\' 2"  (1.575 m), weight 88.5 kg, SpO2 100 %. CVP:  [19 mmHg-20 mmHg] 20 mmHg      Intake/Output Summary (Last 24 hours) at 03/13/2020 0813 Last data filed at 03/13/2020 0600 Gross per 24 hour  Intake 1957.9 ml  Output 431 ml  Net 1526.9 ml   Filed Weights   03/11/20 0500 03/12/20 0500 03/13/20 0500  Weight: 87.6 kg 86.9 kg 88.5 kg    Examination: General: chronically ill appearing woman, no distress, sitting up in bed and more conversant today.  HENT: NCAT, Mild pallor, no icterus Lungs: No accessory muscle use, decreased breath sounds on right Cardiovascular: S1-S2 tacky, regular extremities cool to touch. +2 edema in the lower extremities.  Central line site-with pressure dressing in place Abdomen: soft.,  nt nd Extremities: Cool no edema Neuro:  Alert, interactive, nonfocal Skin: no rash  Lab Results  Component Value Date   WBC 8.6 03/13/2020   HGB 8.8 (L) 03/13/2020   HCT 27.7 (L) 03/13/2020   MCV 113.5 (H) 03/13/2020   PLT 46 (L)  03/13/2020   Lab Results  Component Value Date   CREATININE 2.21 (H) 03/13/2020   BUN 32 (H) 03/13/2020   NA 135 03/13/2020   K 4.0 03/13/2020   CL 100 03/13/2020   CO2 23 03/13/2020    Resolved Hospital Problem list   n/a  Assessment & Plan:   Septic shock due to Enterococcus suspected urinary source with history of vre in urine Plan -abx per ID -sensitive to vanc -will likely be able to de-escalate abx but will defer to id for this.  -d/c perm cath when able (attempting to correct coagulopathy first) - TEE to rule out vegetation when stable for such -off pressores -cont home prednisone of 10mg   Bleeding subclavian line:  -oozing improved -off pressors once as line is improving in oozing perhaps best decision is to cont to correct coagulopathy and once off amio infusion pull central line.  -it has been quite challenging to get to stop as pt was on eliquis prior to insertion and unable to compress vessel.  -inr now 2 (improved) and now plts remain below 50K.  -transfusing 1 u plts  -cont with thrombi pads. Would not place further sutures -discussed with RN.  -holding heparin at this time until plan for perm cath removal and central line determined. Getting pt off amio infusion would be best prior to d/c of line to prevent need for other   .  Acute decompensation HFpEF:  -monitor closely -monitor I/O and wts.  Persistent Atrial Fibrillation with RVR. On eliquis at home.  Bifasicular block Troponinemia likely attributable to type II NSTEMI Plan:  -continue amiodarone drip per cards -increase metoprolol per cards -heparin IV for anticoagulation (when able) instead of Eliquis while in ICU -tele   Acute on chronic hypoxic respiratory failure 2/2 pulm edema vs pneumonia with chronic right pleural effusion. Currently only on 3L supplemental oxygen but with abdominal breathing -req 2L continuous at home per pt Chronic right pleural effusionPreviously noted on imaging so do  not suspect this to be the source of respiratory failure and is likely 2/2 heart failure /ESRD. COPD Plan: Tolerating off BiPAP Consider thoracentesis only if no response to antibiotics   Acute toxic/metabolic encephalopathy.  Plan: Improved as blood pressure improved -however does have somewhat elevated ammonia -cont lactulose  ESRD on HD. -Continue CRRT per renal -May have to consider removal of permacath  Transaminitis. AST 432->1563; ALT 330->2005. Suspect to be attributable to liver shock with hypotension/heart failure. However cont to worsen despite improvement in bp  Plan:  RUQ [place] >> no evidence of obstructive jaundice Continue to monitor. Repeat cmp in am -Avoid hepatotoxic drugs. Elevated ammonia:  -lactulose  T2DM with hyperglycemia. continue SSI and adjust accordingly to CBGs  Hypothyroidism. Continue synthroid  Rheumatoid arthritis: continue prednisone 10mg  daily.  Seizure disorder: continue keppra  Best practice:  Diet: NPO Pain/Anxiety/Delirium protocol (if indicated): n/a VAP protocol (if indicated): n/a DVT prophylaxis: heparin IV GI prophylaxis: ppi Glucose control: SSI Mobility: BR Code Status: Full Family Communication: Patient, she designates her niece as Nicut for transfer from ICU at this time. CCM will sign off and have asked TRH to reassume care as of 7/30   care time 35 mins. This represents my time independent of  the NPs time taking care of the pt. This is excluding procedures.    [facility] 03/13/2020, 8:13 AM

## 2020-03-14 DIAGNOSIS — I4891 Unspecified atrial fibrillation: Secondary | ICD-10-CM | POA: Diagnosis not present

## 2020-03-14 DIAGNOSIS — J9621 Acute and chronic respiratory failure with hypoxia: Secondary | ICD-10-CM | POA: Diagnosis not present

## 2020-03-14 DIAGNOSIS — R6521 Severe sepsis with septic shock: Secondary | ICD-10-CM | POA: Diagnosis not present

## 2020-03-14 DIAGNOSIS — I4819 Other persistent atrial fibrillation: Secondary | ICD-10-CM

## 2020-03-14 DIAGNOSIS — R748 Abnormal levels of other serum enzymes: Secondary | ICD-10-CM | POA: Diagnosis not present

## 2020-03-14 DIAGNOSIS — I5033 Acute on chronic diastolic (congestive) heart failure: Secondary | ICD-10-CM | POA: Diagnosis not present

## 2020-03-14 DIAGNOSIS — E875 Hyperkalemia: Secondary | ICD-10-CM

## 2020-03-14 DIAGNOSIS — Z452 Encounter for adjustment and management of vascular access device: Secondary | ICD-10-CM

## 2020-03-14 DIAGNOSIS — D539 Nutritional anemia, unspecified: Secondary | ICD-10-CM

## 2020-03-14 DIAGNOSIS — R0602 Shortness of breath: Secondary | ICD-10-CM | POA: Diagnosis not present

## 2020-03-14 DIAGNOSIS — A4181 Sepsis due to Enterococcus: Secondary | ICD-10-CM | POA: Diagnosis not present

## 2020-03-14 DIAGNOSIS — I10 Essential (primary) hypertension: Secondary | ICD-10-CM

## 2020-03-14 LAB — BPAM PLATELET PHERESIS
Blood Product Expiration Date: 202107302359
ISSUE DATE / TIME: 202107291145
Unit Type and Rh: 6200

## 2020-03-14 LAB — PROTIME-INR
INR: 1.7 — ABNORMAL HIGH (ref 0.8–1.2)
Prothrombin Time: 18.9 seconds — ABNORMAL HIGH (ref 11.4–15.2)

## 2020-03-14 LAB — CBC
HCT: 28.3 % — ABNORMAL LOW (ref 36.0–46.0)
Hemoglobin: 9.1 g/dL — ABNORMAL LOW (ref 12.0–15.0)
MCH: 36.8 pg — ABNORMAL HIGH (ref 26.0–34.0)
MCHC: 32.2 g/dL (ref 30.0–36.0)
MCV: 114.6 fL — ABNORMAL HIGH (ref 80.0–100.0)
Platelets: 51 10*3/uL — ABNORMAL LOW (ref 150–400)
RBC: 2.47 MIL/uL — ABNORMAL LOW (ref 3.87–5.11)
RDW: 16.1 % — ABNORMAL HIGH (ref 11.5–15.5)
WBC: 9.9 10*3/uL (ref 4.0–10.5)
nRBC: 1.2 % — ABNORMAL HIGH (ref 0.0–0.2)

## 2020-03-14 LAB — COOXEMETRY PANEL
Carboxyhemoglobin: 0.9 % (ref 0.5–1.5)
Methemoglobin: 0.8 % (ref 0.0–1.5)
O2 Saturation: 77.1 %
Total hemoglobin: 13.8 g/dL (ref 12.0–16.0)

## 2020-03-14 LAB — HEPATIC FUNCTION PANEL
ALT: 1393 U/L — ABNORMAL HIGH (ref 0–44)
AST: 366 U/L — ABNORMAL HIGH (ref 15–41)
Albumin: 2.8 g/dL — ABNORMAL LOW (ref 3.5–5.0)
Alkaline Phosphatase: 145 U/L — ABNORMAL HIGH (ref 38–126)
Bilirubin, Direct: 0.4 mg/dL — ABNORMAL HIGH (ref 0.0–0.2)
Indirect Bilirubin: 1.2 mg/dL — ABNORMAL HIGH (ref 0.3–0.9)
Total Bilirubin: 1.6 mg/dL — ABNORMAL HIGH (ref 0.3–1.2)
Total Protein: 4.9 g/dL — ABNORMAL LOW (ref 6.5–8.1)

## 2020-03-14 LAB — RENAL FUNCTION PANEL
Albumin: 2.8 g/dL — ABNORMAL LOW (ref 3.5–5.0)
Anion gap: 12 (ref 5–15)
BUN: 49 mg/dL — ABNORMAL HIGH (ref 8–23)
CO2: 25 mmol/L (ref 22–32)
Calcium: 8.9 mg/dL (ref 8.9–10.3)
Chloride: 103 mmol/L (ref 98–111)
Creatinine, Ser: 3.18 mg/dL — ABNORMAL HIGH (ref 0.44–1.00)
GFR calc Af Amer: 17 mL/min — ABNORMAL LOW (ref >60)
GFR calc non Af Amer: 14 mL/min — ABNORMAL LOW (ref >60)
Glucose, Bld: 213 mg/dL — ABNORMAL HIGH (ref 70–99)
Phosphorus: 3.7 mg/dL (ref 2.5–4.6)
Potassium: 3.9 mmol/L (ref 3.5–5.1)
Sodium: 140 mmol/L (ref 135–145)

## 2020-03-14 LAB — GLUCOSE, CAPILLARY
Glucose-Capillary: 293 mg/dL — ABNORMAL HIGH (ref 70–99)
Glucose-Capillary: 191 mg/dL — ABNORMAL HIGH (ref 70–99)
Glucose-Capillary: 136 mg/dL — ABNORMAL HIGH (ref 70–99)
Glucose-Capillary: 260 mg/dL — ABNORMAL HIGH (ref 70–99)
Glucose-Capillary: 243 mg/dL — ABNORMAL HIGH (ref 70–99)

## 2020-03-14 LAB — PREPARE PLATELET PHERESIS: Unit division: 0

## 2020-03-14 LAB — LACTIC ACID, PLASMA: Lactic Acid, Venous: 2.2 mmol/L (ref 0.5–1.9)

## 2020-03-14 MED ORDER — CALCITRIOL 0.25 MCG PO CAPS
ORAL_CAPSULE | ORAL | Status: AC
  Filled 2020-03-14: qty 1

## 2020-03-14 MED ORDER — METOPROLOL TARTRATE 5 MG/5ML IV SOLN
INTRAVENOUS | Status: AC
  Filled 2020-03-14: qty 5

## 2020-03-14 MED ORDER — SODIUM CHLORIDE 0.9 % IV BOLUS
250.0000 mL | Freq: Once | INTRAVENOUS | Status: AC
  Administered 2020-03-14: 250 mL via INTRAVENOUS

## 2020-03-14 MED ORDER — DILTIAZEM HCL 30 MG PO TABS
30.0000 mg | ORAL_TABLET | Freq: Four times a day (QID) | ORAL | Status: DC
  Administered 2020-03-14 – 2020-03-15 (×5): 30 mg via ORAL
  Filled 2020-03-14 (×5): qty 1

## 2020-03-14 MED ORDER — MIDODRINE HCL 5 MG PO TABS
2.5000 mg | ORAL_TABLET | Freq: Three times a day (TID) | ORAL | Status: DC
  Administered 2020-03-14 – 2020-03-15 (×3): 2.5 mg via ORAL
  Filled 2020-03-14 (×3): qty 1

## 2020-03-14 MED ORDER — METOPROLOL TARTRATE 25 MG PO TABS
25.0000 mg | ORAL_TABLET | Freq: Two times a day (BID) | ORAL | Status: DC
  Administered 2020-03-15 (×2): 25 mg via ORAL
  Filled 2020-03-14 (×2): qty 1

## 2020-03-14 MED ORDER — METHYLPREDNISOLONE SODIUM SUCC 40 MG IJ SOLR
40.0000 mg | Freq: Four times a day (QID) | INTRAMUSCULAR | Status: AC
  Administered 2020-03-14 – 2020-03-15 (×3): 40 mg via INTRAVENOUS
  Filled 2020-03-14 (×3): qty 1

## 2020-03-14 MED ORDER — METOPROLOL TARTRATE 5 MG/5ML IV SOLN
5.0000 mg | Freq: Four times a day (QID) | INTRAVENOUS | Status: DC | PRN
  Administered 2020-03-14: 5 mg via INTRAVENOUS
  Filled 2020-03-14 (×2): qty 5

## 2020-03-14 NOTE — Progress Notes (Signed)
Pts. CVC dressing saturated with blood. IV team consulted x2 d/t positioning of catheter. RN informed CVC okay to redress.

## 2020-03-14 NOTE — Progress Notes (Signed)
Brief nephrology note Developed decreased mental status during last part of HD. Similar to prev described episodes.  Probably related to some IDH.  Placed in trendelenburg, BP picked up. CBG ok.  Began to return to baseline.  Will cont to monitor.

## 2020-03-14 NOTE — Consult Note (Addendum)
Cardiology Consultation:   Patient ID: [name] MRN: [id number]; DOB: May 05, 1951  Admit date: 03/10/2020 Date of Consult: 03/14/2020  Primary Care Provider: Glenda Chroman, MD [facility] Cardiologist: Rozann Lesches, MD  [place] Electrophysiologist:  none   Patient Profile:   [name] is a 69 y.o. female with a hx of chronic CHF (diastolic), ESRF on HD, baseline bifascicular block, persistent AFib, VHD w/mod AS, HTN, HLD, DM, COPD, OSA,  RA, Schizophrenia, notes report possible seizure d/o vs catatonia, barrett's esophaguswho is being seen today for the evaluation of difficult to control AFib at the request of Dr. Gwenlyn Found.  History of Present Illness:   Ms. Fausto AFib history noted to begin January-February 2021 with respiratory failure/PNA and progressed to needing dialysis. During admission she was reported to have new onset atrial fibrillation (later deemed flutter per cardiology note) and was placed on Eliquis. She was readmitted 11/2019 with racing heart, found to have SVT vs atrial flutter, then interim conversion to NSR. Her carvedilol was changed to metoprolol. She also had elevated troponin in setting of severe tachycardia, ESRD, fluid overload, which was not felt to represent ACS. She required addition of diltiazem. The last note indicates she went into atrial fibrillation again and rate control strategy was employed  to [facility]10[facility] from SNF with shortness of breath and elevated blood glucose. HPI obtained from chart review given patient's acute illness. Per chart review, EMS was called by the SNF staff today, 03/10/2020 as the patient appeared short of breath. EKG performed which showed atrial fibrillation with RVR with heart rate in the 170 range. She received IV diltiazem in route and was placed on a nonrebreather. Blood glucose was found to be 520 therefore she was treated with insulin therapy.   In the ED, patient was tachypneic and tachycardic. She  was placed on 3 L nasal cannula.  WBC elevated at 16.9.  High-sensitivity troponin found to be 48.  BNP elevated at 1486. Lactic acid found to be 4.9.  Covid testing was negative.  Patient had elevated AST/ALT levels at 432/330.  CXR with fluid volume overload. She was placed on empiric antibiotics per primary team. She remained tachycardic with dilt and hypotensive with concerns she was shocky with cool extremeties and somewhat lethargic, dilt was stopped and started on amiodarone Suspected sepsis with pneumonia Volume to be managed with HD LFTs felt 2/2 shock  She had some improvement with the amio to 110-120 range, mentating better the following day on BIPAP, suspect HR driven by infection/illness continued to improve, remained 110's-120's weaned off pressors 03/12/2020 and > n/c O2 Yesterday BPs stable and low dose lopressor started and titrated to 25mg  BID in addition to amio gtt Today rates were up some 120-130's and during/post HD BP's 90's  Note that her IV heparin has been stopped due to bleeding and thrombocytopenia - s/p 1U PLT and 2U FFP yesterday She is bacteremic w/enterococcus faecalis also has UTI with cultire noting multiple speciesl Perm cath was removed yesterday  LABS COox 77/1 K+ 3.9 BUN/Creat 49/3.18 WBC 9.9 H/H 9/28 Plts 51 INR 1.7   Past Medical History:  Diagnosis Date   Anemia    Angina pectoris (HCC)    Anxiety    Asthma    Atrial flutter (HCC)    Back pain    Barrett esophagus    Bifascicular block    BMI 39.0-39.9,adult    Cellulitis    CHF (congestive heart failure) ([place])  Chronic diastolic CHF (congestive heart failure) (HCC)    Constipation    Contact with and (suspected) exposure to covid-19    COPD (chronic obstructive pulmonary disease) (HCC)    Dependence on renal dialysis ([place])    Depression    Dysphagia    Dysuria    Epilepsy ([place])    ESRD on hemodialysis ([place])    Essential hypertension    Fibromyalgia    Fibromyalgia    GERD  (gastroesophageal reflux disease)    Gout    History of pericarditis    Hypercholesterolemia    Hyperlipidemia    Hypothyroid    Hypothyroidism    Insomnia    Long term (current) use of insulin ([place])    Major depressive disorder    Memory loss    Moderate protein-calorie malnutrition ([place])    Morbid obesity ([place])    Nephrolithiasis    2008   Obesity hypoventilation syndrome (HCC)    CPAP   Obstructive sleep apnea    Pain in left arm    PE (pulmonary embolism)    2010   Peripheral neuropathy    Persistent atrial fibrillation (HCC)    Pneumonia    Polyp of nasal sinus    PTSD (post-traumatic stress disorder)    Pulmonary hypertension (HCC)    Rheumatoid arteritis (HCC)    Rheumatoid arthritis (HCC)    Schizophrenia (HCC)    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   Seizures ([place])    Steroid dependence (HCC)    Tachycardia, unspecified    Type 2 diabetes mellitus ([place])    Unspecified intellectual disabilities    Unspecified psychosis not due to a substance or known physiological condition ([place])    Urinary tract infection    Vitamin D deficiency     Past Surgical History:  Procedure Laterality Date   A/V FISTULAGRAM N/A 01/28/2020   Procedure: A/V FISTULAGRAM - Right [place];  Surgeon: Waynetta Sandy, MD;  Location: [place] [facility];  Service: Cardiovascular;  Laterality: N/A;   APPENDECTOMY     AV FISTULA PLACEMENT Right 09/27/2019   Procedure: ARTERIOVENOUS (AV) FISTULA CREATION;  Surgeon: Waynetta Sandy, MD;  Location: [place];  Service: Vascular;  Laterality: Right;   AV FISTULA PLACEMENT Right 02/20/2020   Procedure: INSERTION OF ARTERIOVENOUS (AV) GORE-TEX GRAFT ARM;  Surgeon: Waynetta Sandy, MD;  Location: [place];  Service: Vascular;  Laterality: Right;   CHOLECYSTECTOMY     COLONOSCOPY     benign polyps (12/2000), repeat colonoscopy performed in 2007   ENDOMETRIAL BIOPSY     10/03 benign columnar mucosa (limited material)    INSERTION OF DIALYSIS CATHETER Right 09/14/2019   Procedure: ATTEMPTED INSERTION OF DIALYSIS CATHETER;  Surgeon: Virl Cagey, MD;  Location: [facility];  Service: General;  Laterality: Right;   INSERTION OF DIALYSIS CATHETER Right 09/27/2019   Procedure: INSERTION OF DIALYSIS CATHETER;  Surgeon: Waynetta Sandy, MD;  Location: [place];  Service: Vascular;  Laterality: Right;   IR REMOVAL TUN CV CATH W/O FL  03/13/2020   KIDNEY SURGERY     REVISON OF ARTERIOVENOUS FISTULA Right 02/20/2020   Procedure: REVISON OF ARTERIOVENOUS FISTULA;  Surgeon: Waynetta Sandy, MD;  Location: [place];  Service: Vascular;  Laterality: Right;     Home Medications:  Prior to Admission medications   Medication Sig Start Date End Date Taking? Authorizing Provider  acetaminophen (TYLENOL) 500 MG tablet Take 500 mg by mouth 2 (two) times daily. At 0800 and  2000   Yes [provider]  albuterol (VENTOLIN HFA) 108 (90 Base) MCG/ACT inhaler Inhale 1 puff into the lungs every 6 (six) hours as needed for wheezing or shortness of breath.   Yes [provider]  Amino Acids (AMINO ACID PROTEIN PO) Take 30 mLs by mouth 2 (two) times daily. At 0800 and 2000   Yes [provider]  apixaban (ELIQUIS) 5 MG TABS tablet Take 1 tablet (5 mg total) by mouth 2 (two) times daily. Patient taking differently: Take 5 mg by mouth 2 (two) times daily. At 0800 and 2000 10/01/19  Yes Shelly Coss, MD  ARIPiprazole (ABILIFY) 2 MG tablet Take 2 mg by mouth daily. At 0900 12/10/19  Yes [provider]  aspirin EC 81 MG tablet Take 1 tablet (81 mg total) by mouth daily with breakfast. Patient taking differently: Take 81 mg by mouth daily with breakfast. At 0800 01/25/20  Yes Emokpae, Courage, MD  atorvastatin (LIPITOR) 20 MG tablet Take 20 mg by mouth at bedtime. At 2000 05/14/19  Yes [provider]  buPROPion (WELLBUTRIN XL) 150 MG 24 hr tablet Take 150 mg by mouth daily. At 0800   Yes  [provider]  calcitRIOL (ROCALTROL) 0.25 MCG capsule Take 1 capsule (0.25 mcg total) by mouth every Monday, Wednesday, and Friday. Patient taking differently: Take 0.25 mcg by mouth every Monday, Wednesday, and Friday. At 0800 10/01/19  Yes Shelly Coss, MD  Calcium Acetate 667 MG TABS Take 2,001 mg by mouth 3 (three) times daily with meals. At 0800, 1200, and 1600   Yes [provider]  calcium carbonate (TUMS - DOSED IN MG ELEMENTAL CALCIUM) 500 MG chewable tablet Chew 1 tablet by mouth every 6 (six) hours as needed for indigestion or heartburn.   Yes [provider]  cetaphil (CETAPHIL) lotion Apply 1 application topically every 12 (twelve) hours as needed for dry skin. Apply to feet/legs   Yes [provider]  Cholecalciferol (VITAMIN D3) 50 MCG (2000 UT) TABS Take 4,000 Units by mouth daily. At 0800   Yes [provider]  Dextromethorphan-guaiFENesin 5-100 MG/5ML LIQD Take 5 mLs by mouth daily as needed (cough).   Yes [provider]  diltiazem (CARDIZEM CD) 360 MG 24 hr capsule Take 360 mg by mouth daily. At 0800   Yes [provider]  Dulaglutide (TRULICITY) 1.5 VZ/4.8OL SOPN Inject 1.5 mg into the skin every Friday. At 0800   Yes [provider]  DULoxetine (CYMBALTA) 60 MG capsule Take 60 mg by mouth daily. At 0800 05/11/19  Yes [provider]  EPINEPHrine (EPI-PEN) 0.3 mg/0.3 mL DEVI Inject 0.3 mg into the muscle once as needed (for bee stings, then go to the ED immediately after using).    Yes [provider]  gabapentin (NEURONTIN) 100 MG capsule Take 1 capsule (100 mg total) by mouth 3 (three) times daily. Patient taking differently: Take 100 mg by mouth 3 (three) times daily. At 0800, 1200, and 1700 10/01/19  Yes Adhikari, Tamsen Meek, MD  insulin aspart (NOVOLOG) 100 UNIT/ML injection Inject 10 Units into the skin 3 (three) times daily before meals. At 0800, 1200, and 1700 Additional units according  to sliding scale 151-200 = 2 units 201-250 = 4 units 251-300 = 6 units 301-350 = 8 units 351-400 = 10units 401-450 = 12units >400 = call MD   Yes [provider]  levETIRAcetam (KEPPRA) 250 MG tablet Take 250 mg by mouth 2 (two) times daily. At 0800  and 2000   Yes [provider]  levothyroxine (SYNTHROID) 50 MCG tablet Take 50 mcg by mouth daily before breakfast. At 0800   Yes [provider]  metoprolol succinate (TOPROL-XL) 200 MG 24 hr tablet Take 1 tablet (200 mg total) by mouth daily. Take with or immediately following a meal. Patient taking differently: Take 200 mg by mouth daily. Take with or immediately following a meal. At 0800 11/27/19  Yes Tat, Shanon Brow, MD  montelukast (SINGULAIR) 10 MG tablet Take 10 mg by mouth at bedtime. At 2000   Yes [provider]  Multiple Vitamin (DAILY VITE PO) Take 1 tablet by mouth daily. At 0800   Yes [provider]  nitroGLYCERIN (NITROSTAT) 0.4 MG SL tablet Place 0.4 mg under the tongue every 5 (five) minutes x 3 doses as needed for chest pain.  01/13/20  Yes [provider]  omeprazole (PRILOSEC) 20 MG capsule Take 20 mg by mouth in the morning. At 0600   Yes [provider]  ondansetron (ZOFRAN) 4 MG tablet Take 4 mg by mouth every 6 (six) hours as needed for nausea or vomiting.   Yes [provider]  predniSONE (DELTASONE) 10 MG tablet Take 10 mg by mouth daily. At 0800   Yes [provider]  primidone (MYSOLINE) 50 MG tablet Take 50 mg by mouth daily. At 0800   Yes [provider]  pyridoxine (B-6) 200 MG tablet Take 200 mg by mouth in the morning and at bedtime. At 0800 and 2000   Yes [provider]  senna (SENOKOT) 8.6 MG tablet Take 1 tablet by mouth 2 (two) times daily. At 0800 and 2000   Yes [provider]  traZODone (DESYREL) 50 MG tablet Take 50 mg by mouth at bedtime. At 2000 05/11/19  Yes [provider]  traMADol (ULTRAM) 50  MG tablet Take 1 tablet (50 mg total) by mouth every 8 (eight) hours as needed. Patient not taking: Reported on 03/10/2020 02/20/20   Gabriel Earing, PA-C    Inpatient Medications: Scheduled Meds:  ARIPiprazole  2 mg Oral Daily   aspirin EC  81 mg Oral Q breakfast   buPROPion  150 mg Oral Daily   calcitRIOL  0.25 mcg Oral Q M,W,F   calcium acetate  2,001 mg Oral TID WC   Chlorhexidine Gluconate Cloth  6 each Topical Daily   feeding supplement (ENSURE ENLIVE)  237 mL Oral BID BM   insulin aspart  0-15 Units Subcutaneous TID WC   insulin aspart  2 Units Subcutaneous TID WC   insulin detemir  5 Units Subcutaneous Daily   lactulose  10 g Oral BID   levETIRAcetam  250 mg Oral BID   levothyroxine  50 mcg Oral QAC breakfast   mouth rinse  15 mL Mouth Rinse BID   methylPREDNISolone (SOLU-MEDROL) injection  40 mg Intravenous Q6H   metoprolol tartrate       metoprolol tartrate  25 mg Oral BID   midodrine  2.5 mg Oral TID WC   montelukast  10 mg Oral QHS   multivitamin  1 tablet Oral QHS   pantoprazole  40 mg Oral Daily   predniSONE  10 mg Oral Q breakfast   primidone  50 mg Oral Daily   sodium chloride flush  10-40 mL Intracatheter Q12H   Thrombi-Pad  1 each Topical Once   traZODone  50 mg Oral QHS   Continuous Infusions:  sodium chloride     amiodarone  30 mg/hr (03/14/20 0105)   ampicillin (OMNIPEN) IV 2 g (03/14/20 1439)   heparin Stopped (03/12/20 1505)   phenylephrine (NEO-SYNEPHRINE) Adult infusion Stopped (03/12/20 1435)   PRN Meds: acetaminophen, albuterol, calcium carbonate, diphenhydrAMINE, docusate sodium, EPINEPHrine, heparin, metoprolol tartrate, nitroGLYCERIN, ondansetron (ZOFRAN) IV, sodium chloride flush  Allergies:    Allergies  Allergen Reactions   Benztropine Mesylate Other (See Comments)    Alopecia and white spots on eyes   Codeine Swelling, Palpitations and Other (See Comments)    Mouth Swelling and Tachycardia    Diazepam Other (See Comments)    COMA    Diphenhydramine Hcl Anxiety, Palpitations and Other (See Comments)    Tachycardia and anxiousness   Sulfonamide Derivatives Rash    Blisters, also   Thioridazine Hcl Swelling   Bee Venom     Documented on MAR   Lisinopril Cough    Social History:   Social History   Socioeconomic History   Marital status: Single    Spouse name: Not on file   Number of children: 0   Years of education: Not on file   Highest education level: 12th grade  Occupational History   Not on file  Tobacco Use   Smoking status: Former Smoker    Types: Cigarettes    Quit date: 06/23/1972    Years since quitting: 47.7   Smokeless tobacco: Never Used  Vaping Use   Vaping Use: Never used  Substance and Sexual Activity   Alcohol use: No   Drug use: No   Sexual activity: Never  Other Topics Concern   Not on file  Social History Narrative   Right handed   Caffeine 1 cup per day    Lives at [place] Strain:    Difficulty of Paying Living Expenses:   Food Insecurity:    Worried About Charity fundraiser in the Last Year:    Arboriculturist in the Last Year:   Transportation Needs:    Film/video editor (Medical):    Lack of Transportation (Non-Medical):   Physical Activity:    Days of Exercise per Week:    Minutes of Exercise per Session:   Stress:    Feeling of Stress :   Social Connections:    Frequency of Communication with Friends and Family:    Frequency of Social Gatherings with Friends and Family:    Attends Religious Services:    Active Member of Clubs or Organizations:    Attends Music therapist:    Marital Status:   Intimate Partner Violence:    Fear of Current or Ex-Partner:    Emotionally Abused:    Physically Abused:    Sexually Abused:     Family History:   Family History  Problem Relation Age of Onset   Bone cancer Mother    Hypertension Mother    Heart disease Mother    COPD Father    Heart  disease Father    Stroke Father      ROS:  Please see the history of present illness.  All other ROS reviewed and negative.     Physical Exam/Data:   Vitals:   03/14/20 1300 03/14/20 1305 03/14/20 1426 03/14/20 1540  BP: 99/65 107/66 (!) 95/64 111/74  Pulse: 58 82 (!) 38 (!) 130  Resp: (!) 30  19 20   Temp: (!) 97.4 F (36.3 C)  98 F (36.7 C)   TempSrc:  Oral   SpO2: 100%  98% 97%  Weight:  86.6 kg    Height:        Intake/Output Summary (Last 24 hours) at 03/14/2020 1550 Last data filed at 03/14/2020 1526 Gross per 24 hour  Intake 480 ml  Output 1709 ml  Net -1229 ml   Last 3 Weights 03/14/2020 03/14/2020 03/14/2020  Weight (lbs) 190 lb 14.7 oz 198 lb 6.6 oz 194 lb 3.6 oz  Weight (kg) 86.6 kg 90 kg 88.1 kg     Body mass index is 34.92 kg/m.  General:  Well nourished, well developed, in no acute distress HEENT: normal Lymph: no adenopathy Neck: no JVD Endocrine:  No thryomegaly Vascular: No carotid bruits  Cardiac:  irreg-irreg, tachycardic; no appreciated murmurs, gallops or rubs Lungs:  decreaed at the bases, no wheezing, rhonchi or rales  Abd: soft, nontender, obese  Ext: no edema Musculoskeletal:  No deformities Skin: warm and dry  Neuro:  no notable gross focal motor abnormalities noted, she is slow to follow commands Psych:  Very flat affect, slow verbal responses (unchanged in d/w team)   EKG:  The EKG was personally reviewed and demonstrates:   AFib 133, RBBB, LAD  Telemetry:  Telemetry was personally reviewed and demonstrates:   AFib 130's-140  Relevant CV Studies:   2D echocardiogram (03/11/2020)  IMPRESSIONS   1. Challenging to estimate EF with underlying tachycardia, however does  not appear severly reduced.   2. Left ventricular ejection fraction, by estimation, is 45 to 50%. The  left ventricle has mildly decreased function. The left ventricle  demonstrates global hypokinesis. Left ventricular diastolic parameters are  indeterminate.   3.  Right ventricular systolic function is mildly reduced. The right  ventricular size is moderately enlarged. There is moderately elevated  pulmonary artery systolic pressure. The estimated right ventricular  systolic pressure is 69.4 mmHg.   4. Left atrial size was severely dilated.   5. Right atrial size was moderately dilated.   6. The mitral valve is normal in structure. Mild mitral valve  regurgitation. No evidence of mitral stenosis.   7. Tricuspid valve regurgitation is moderate to severe.   8. The aortic valve is tricuspid. Aortic valve regurgitation is not  visualized. Mild to moderate aortic valve sclerosis/calcification is  present, without any evidence of aortic stenosis.   9. The inferior vena cava is dilated in size with <50% respiratory  variability, suggesting right atrial pressure of 15 mmHg.  ECHO: 11/23/19:  1. Left ventricular ejection fraction, by estimation, is 65 to 70%. The  left ventricle has normal function. The left ventricle has no regional  wall motion abnormalities. There is mild left ventricular hypertrophy.  Left ventricular diastolic parameters  are indeterminate.   2. Right ventricular systolic function is normal. The right ventricular  size is moderately enlarged. There is moderately elevated pulmonary artery  systolic pressure.   3. Left atrial size was severely dilated.   4. Right atrial size was severely dilated.   5. The mitral valve is normal in structure. Mild mitral valve  regurgitation. No evidence of mitral stenosis.   6. Tricuspid valve regurgitation is moderate.   7. The aortic valve is tricuspid. Aortic valve regurgitation is not  visualized. Mild to moderate aortic valve stenosis.   8. The inferior vena cava is normal in size with greater than 50%  respiratory variability, suggesting right atrial pressure of 3 mmHg.    ECHO 09/15/2018:   1. The left ventricle has normal systolic  function of 60-65%. The cavity  size is normal. There is  concentric left ventricular wall thickness. Echo  evidence of impaired relaxation diastolic filling patterns. Normal left  ventricular filling pressures.   2. Severely dilated left atrial size.   3. Normal right atrial size.   4. Normal tricuspid valve.   5. Tricuspid regurgitation is mild.   6. The aortic valve tricuspid. There is mild thickening of the aortic  valve.   7. Mild aortic annular calcification.   8. The aortic root is normal is size and structure.   9. No atrial level shunt detected by color flow Doppler.   Laboratory Data:  High Sensitivity Troponin:   Recent Labs  Lab 03/10/20 1032 03/10/20 1323  TROPONINIHS 42* 56*     Chemistry Recent Labs  Lab 03/12/20 0301 03/13/20 0214 03/14/20 0415  NA 138 135 140  K 4.2 4.0 3.9  CL 102 100 103  CO2 22 23 25   GLUCOSE 271* 252* 213*  BUN 24* 32* 49*  CREATININE 1.83* 2.21* 3.18*  CALCIUM 8.6* 8.7* 8.9  GFRNONAA 28* 22* 14*  GFRAA 32* 26* 17*  ANIONGAP 14 12 12     Recent Labs  Lab 03/10/20 1112 03/11/20 0335 03/12/20 0301 03/13/20 0214 03/14/20 0415  PROT 5.0*  --   --  5.2*  --   ALBUMIN 3.1*   < > 3.0* 3.0*  3.0* 2.8*  AST 432*  --   --  1,563*  --   ALT 330*  --   --  2,005*  --   ALKPHOS 99  --   --  140*  --   BILITOT 1.5*  --   --  1.4*  --    < > = values in this interval not displayed.   Hematology Recent Labs  Lab 03/12/20 1521 03/13/20 0214 03/14/20 0415  WBC 11.0* 8.6 9.9  RBC 2.38* 2.44* 2.47*  HGB 8.5* 8.8* 9.1*  HCT 26.7* 27.7* 28.3*  MCV 112.2* 113.5* 114.6*  MCH 35.7* 36.1* 36.8*  MCHC 31.8 31.8 32.2  RDW 16.2* 16.2* 16.1*  PLT 48* 46* 51*   BNP Recent Labs  Lab 03/10/20 1032  BNP 1,486.7*    DDimer No results for input(s): DDIMER in the last 168 hours.   Radiology/Studies:   EEG Result Date: 03/12/2020 Kathleen Havens, MD     03/12/2020 10:19 AM Patient Name: [name] MRN: [id number] Epilepsy Attending: Lora Cross Referring Physician/Provider: Dr.  Amie [place] Date: 03/12/2020 Duration: 24.37 minutes Patient history: 69 year old female with past medical history of catatonia, schizoaffective disorder as well as history of seizures who was noted to have a blank stare with some chewing movements of the mouth and not responding to questions lasting for about 15 minutes.  EEG to evaluate for seizures. Level of alertness: Awake AEDs during EEG study: LEV Technical aspects: This EEG study was done with scalp electrodes positioned according to the 10-20 International system of electrode placement. Electrical activity was acquired at a sampling rate of 500Hz  and reviewed with a high frequency filter of 70Hz  and a low frequency filter of 1Hz . EEG data were recorded continuously and digitally stored. Description: The posterior dominant rhythm consists of 6 Hz activity of moderate voltage (25-35 uV) seen predominantly in posterior head regions, symmetric and reactive to eye opening and eye closing. EEG showed continuous generalized 5 to 6 Hz theta slowing. Hyperventilation and photic stimulation were not performed.   ABNORMALITY -Continuous slow, generalized -Background slow IMPRESSION: This  study is suggestive of moderate diffuse encephalopathy, nonspecific etiology. No seizures or epileptiform discharges were seen throughout the recording. [name]    MR BRAIN WO CONTRAST Result Date: 03/12/2020 CLINICAL DATA:  69 year old female with altered mental status. On anticoagulation for atrial fibrillation. EXAM: MRI HEAD WITHOUT CONTRAST TECHNIQUE: Multiplanar, multiecho pulse sequences of the brain and surrounding structures were obtained without intravenous contrast. COMPARISON:  Head CT yesterday.  Brain MRI 09/27/2018. FINDINGS: Brain: Stable cerebral volume. No restricted diffusion to suggest acute infarction. No midline shift, mass effect, evidence of mass lesion, ventriculomegaly, extra-axial collection or acute intracranial hemorrhage. Cervicomedullary  junction and pituitary are within normal limits. Stable minimal nonspecific cerebral white matter T2 and FLAIR hyperintensity since 2020. Suggestion of a small chronic focus of encephalomalacia along the medial cerebellum near the vermis (series 6, image 7). No other cortical encephalomalacia. No chronic cerebral blood products are evident. Deep gray nuclei and brainstem remain normal. Vascular: Major intracranial vascular flow voids are stable since last year. Skull and upper cervical spine: Negative. Sinuses/Orbits: Stable and negative orbits. Small maxillary sinus mucous retention cysts are stable. Other: Trace mastoid fluid is stable since last year. Grossly normal visible internal auditory structures. Scalp and face soft tissues appear negative. IMPRESSION: 1.  No acute intracranial abnormality. 2. 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 Electronically Signed   By: Genevie Ann M.D.   On: 03/12/2020 15:09     DG CHEST PORT 1 VIEW Result Date: 03/13/2020 CLINICAL DATA:  Recent pleural effusion EXAM: PORTABLE CHEST 1 VIEW COMPARISON:  March 12, 2020 FINDINGS: Right subclavian catheter tip is in the superior vena cava near the cavoatrial junction. Catheter loops upon itself in the right axillary region, unchanged. Left central catheter tip is in the superior vena cava. No pneumothorax. There is a suspected small right pleural effusion. Lungs elsewhere are clear. Heart is enlarged with pulmonary vascularity normal. No adenopathy. There is degenerative change in each shoulder. IMPRESSION: 1. Suspect small right pleural effusion, stable. Lungs otherwise clear. 2. Stable cardiomegaly. Pulmonary vascularity currently appears normal. 3. The central catheter positions are unchanged. The right subclavian catheter loops upon itself in the right axillary region, unchanged from 1 day prior. Electronically Signed   By:  Lowella Grip III M.D.   On: 03/13/2020 08:37     CT HEAD CODE STROKE WO CONTRAST Result Date: 03/11/2020 CLINICAL DATA:  Code stroke.  69 year old female code stroke. EXAM: CT HEAD WITHOUT CONTRAST TECHNIQUE: Contiguous axial images were obtained from the base of the skull through the vertex without intravenous contrast. COMPARISON:  Head CT 03/07/2020.  Brain MRI 09/27/2018. FINDINGS: Brain: Gray-white matter differentiation is stable and normal for age throughout the brain. No midline shift, ventriculomegaly, mass effect, evidence of mass lesion, intracranial hemorrhage or evidence of cortically based acute infarction. Vascular: Calcified atherosclerosis at the skull base. No suspicious intracranial vascular hyperdensity. Skull: No acute osseous abnormality identified. Sinuses/Orbits: Visualized paranasal sinuses and mastoids are stable and well pneumatized. Other: No acute orbit or scalp soft tissue finding. ASPECTS [facility] Stroke Program Early CT Score) Total score (0-10 with 10 being normal): 10 IMPRESSION: 1. Stable and normal for age non contrast CT appearance of the brain. ASPECTS 10. 2. These results were communicated to Dr. Rory Percy at 10:19 pm on 03/11/2020 by text page via the [facility] messaging system. Electronically Signed   By: Herminio Heads.D.  On: 03/11/2020 22:19    US Abdomen Limited RUQ Result Date: 03/10/2020 CLINICAL DATA:  Elevated LFTs EXAM: ULTRASOUND ABDOMEN LIMITED RIGHT UPPER QUADRANT COMPARISON:  None. FINDINGS: Gallbladder: Surgically removed Common bile duct: Diameter: 4.5 mm Liver: Diffusely increased in echogenicity consistent with fatty infiltration. No mass is seen. Note is made of a small right pleural effusion. Other: None. IMPRESSION: Status post cholecystectomy. Fatty infiltration of the liver. No acute abnormality in the upper abdomen is seen. Incidental note of right pleural effusion. Electronically Signed   By: Inez Catalina M.D.   On: 03/10/2020 21:03    {   Assessment and Plan:   1. Persistent AFib     Notes report rate control strategy had been decided at her previous hospitalization     CHA2DS2Vasc is 5, outpt on Eliquis > here heparin >> off 2/2 oozing/bleeding at tunneled cath site and thrombocytopenia  She had a couple SBP 90's it seems with HD today and on return to the floor. She did get some fluids and a dose of midodrine with SBP 110s now Her situation is very difficult She is bacteremic and not a pace/ablate candidate  She needs to be back on a/c prior to consideration of rhythm control Continue amio gtt and metoprolol, her BP should be able tolerate some PO dilt and Aubrey Blackard start 30mg  Q6 She appears very comfortable, not tachypenic, diaphorteic  Verita Kuroda defer to primary team her midodrine        Dr. Curt Bears has seen and examined the patient this afternoon      For questions or updates, please contact [facility] Please consult [email or web address] for contact info under    Signed, Baldwin Jamaica, PA-C  03/14/2020 3:50 PM  I have seen and examined this patient with Tommye Standard.  Agree with above, note added to reflect my findings.  On exam, irregular, tachycardic.   Patient presented to the hospital with elevated blood glucose, found to be septic requiring vasoactive medications.  She also has a history of atrial fibrillation and has had rapid rates in atrial fibrillation.  She is on dialysis.  She was previously on heparin, but heparin has been stopped as her platelets have been low and she has had bleeding from around her dialysis catheter.  Her dialysis catheter has since been pulled, but her platelets remain low.  She remains in atrial fibrillation with rapid rates, in the 1 teens to 140s.  She is currently on amiodarone drip.  Unfortunately it seems that her blood pressure has been low limiting her rate controlling medications.  Blood pressures have been at times in the 90s and she has been started on midodrine.  On my  exam, her blood pressure is 536 systolic.  This point, would continue with IV amiodarone.  We Callan Yontz add short acting diltiazem to see if this Karuna Balducci help to control her rate.  Unfortunately with her medical problems, not a do believe to be anticoagulated, further therapy is limited.  Once she is anticoagulated, she would likely benefit from TEE and cardioversion.  Aveena Bari M. Jasiel Belisle MD 03/14/2020 6:28 PM

## 2020-03-14 NOTE — Progress Notes (Signed)
PT Cancellation Note  Patient Details Name: [name] MRN: [id number] DOB: 1951-02-08   Cancelled Treatment:    Reason Eval/Treat Not Completed: Medical issues which prohibited therapy (HR in 140's after HD, episode in HD. Will check later date. )   Denice Paradise 03/14/2020, 3:07 PM Kymberlee Viger W,PT [place] Pager:  [phone]  Office:  [phone]

## 2020-03-14 NOTE — Progress Notes (Signed)
ANTICOAGULATION CONSULT NOTE - Follow Up Consult  Pharmacy Consult for heparin Indication: atrial fibrillation  Patient Measurements: Height: 5\' 2"  (157.5 cm) Weight: 88.1 kg (194 lb 3.6 oz) IBW/kg (Calculated) : 50.1 Heparin Dosing Weight: 70.8  Vital Signs: Temp: 97.7 F (36.5 C) (07/30 0530) Temp Source: Oral (07/30 0530) BP: 114/73 (07/30 0530) Pulse Rate: 118 (07/30 0530)  Labs: Recent Labs    03/11/20 1517 03/12/20 0300 03/12/20 0300 03/12/20 0301 03/12/20 1058 03/12/20 1521 03/12/20 1521 03/13/20 0214 03/13/20 0857 03/14/20 0415  HGB  --  10.4*   < >  --   --  8.5*   < > 8.8*  --  9.1*  HCT  --  32.5*   < >  --   --  26.7*  --  27.7*  --  28.3*  PLT  --  80*   < >  --   --  48*  --  46*  --  51*  APTT  --  87*  --   --   --  45*  --  40*  --   --   LABPROT  --   --   --   --  29.1*  --   --   --  22.3* 18.9*  INR  --   --   --   --  2.9*  --   --   --  2.0* 1.7*  HEPARINUNFRC  --  1.98*  --   --   --   --   --  1.22*  --   --   CREATININE   < >  --   --  1.83*  --   --   --  2.21*  --  3.18*   < > = values in this interval not displayed.    Estimated Creatinine Clearance: 17.5 mL/min (A) (by C-G formula based on SCr of 3.18 mg/dL (H)).  Assessment: 69 yo f presenting with Afib RVR - on apixaban PTA. Apixaban held per cards and pharmacy dosing heparin.   Heparin has been stopped since 7/28 due to continued bleeding from central line despite stitch and thrombi pads place. The patient is also noted to have low platelets with low suspicion for HIT based on timing.   Goal of Therapy:  Heparin level 0.3-0.7 units/ml Monitor platelets by anticoagulation protocol: Yes   Plan:  Follow up plan for anticoagulation Continue to monitor H&H and platelets  Rebbeca Paul, PharmD PGY1 Pharmacy Resident 03/14/2020 9:05 AM  Please check [email or web address] for unit-specific pharmacy phone numbers.

## 2020-03-14 NOTE — Progress Notes (Signed)
PT Cancellation Note  Patient Details Name: TONIA AVINO MRN: [id number] DOB: [birth year]   Cancelled Treatment:    Reason Eval/Treat Not Completed: Patient at procedure or test/unavailable (Pt in HD.  Will return as able. )   Denice Paradise 03/14/2020, 11:37 AM Charlean Carneal W,PT [facility] Pager:  [phone]  Office:  [phone]

## 2020-03-14 NOTE — Progress Notes (Signed)
Subjective: No new complaints   Antibiotics:  Anti-infectives (From admission, onward)   Start     Dose/Rate Route Frequency Ordered Stop   03/13/20 1030  ampicillin (OMNIPEN) 2 g in sodium chloride 0.9 % 100 mL IVPB     Discontinue     2 g 300 mL/hr over 20 Minutes Intravenous Every 12 hours 03/13/20 1007     03/13/20 0830  amoxicillin (AMOXIL) capsule 500 mg        500 mg Oral  Once 03/13/20 0748 03/13/20 0848   03/11/20 1200  ceFEPIme (MAXIPIME) 1 g in sodium chloride 0.9 % 100 mL IVPB  Status:  Discontinued        1 g 200 mL/hr over 30 Minutes Intravenous Every 24 hours 03/10/20 1141 03/10/20 1835   03/11/20 1200  vancomycin (VANCOREADY) IVPB 750 mg/150 mL  Status:  Discontinued        750 mg 150 mL/hr over 60 Minutes Intravenous Every 24 hours 03/10/20 1835 03/11/20 0921   03/11/20 1000  linezolid (ZYVOX) tablet 600 mg  Status:  Discontinued        600 mg Oral Every 12 hours 03/11/20 0921 03/13/20 1000   03/11/20 0000  vancomycin variable dose per unstable renal function (pharmacist dosing)  Status:  Discontinued         Does not apply See admin instructions 03/10/20 1141 03/10/20 1835   03/11/20 0000  ceFEPIme (MAXIPIME) 2 g in sodium chloride 0.9 % 100 mL IVPB  Status:  Discontinued        2 g 200 mL/hr over 30 Minutes Intravenous Every 12 hours 03/10/20 1835 03/11/20 0921   03/10/20 1145  ceFEPIme (MAXIPIME) 2 g in sodium chloride 0.9 % 100 mL IVPB        2 g 200 mL/hr over 30 Minutes Intravenous  Once 03/10/20 1133 03/10/20 1217   03/10/20 1145  vancomycin (VANCOREADY) IVPB 2000 mg/400 mL        2,000 mg 200 mL/hr over 120 Minutes Intravenous  Once 03/10/20 1137 03/10/20 1731   03/10/20 1115  aztreonam (AZACTAM) 2 g in sodium chloride 0.9 % 100 mL IVPB  Status:  Discontinued        2 g 200 mL/hr over 30 Minutes Intravenous  Once 03/10/20 1108 03/10/20 1133   03/10/20 1115  metroNIDAZOLE (FLAGYL) IVPB 500 mg        500 mg 100 mL/hr over 60 Minutes Intravenous   Once 03/10/20 1108 03/10/20 1356   03/10/20 1115  vancomycin (VANCOCIN) IVPB 1000 mg/200 mL premix  Status:  Discontinued        1,000 mg 200 mL/hr over 60 Minutes Intravenous  Once 03/10/20 1108 03/10/20 1137      Medications: Scheduled Meds: . ARIPiprazole  2 mg Oral Daily  . aspirin EC  81 mg Oral Q breakfast  . buPROPion  150 mg Oral Daily  . calcitRIOL  0.25 mcg Oral Q M,W,F  . calcium acetate  2,001 mg Oral TID WC  . Chlorhexidine Gluconate Cloth  6 each Topical Daily  . feeding supplement (ENSURE ENLIVE)  237 mL Oral BID BM  . insulin aspart  0-15 Units Subcutaneous TID WC  . insulin aspart  2 Units Subcutaneous TID WC  . insulin detemir  5 Units Subcutaneous Daily  . lactulose  10 g Oral BID  . levETIRAcetam  250 mg Oral BID  . levothyroxine  50 mcg Oral QAC breakfast  . mouth rinse  15 mL Mouth Rinse BID  . methylPREDNISolone (SOLU-MEDROL) injection  40 mg Intravenous Q6H  . metoprolol tartrate      . metoprolol tartrate  25 mg Oral BID  . midodrine  2.5 mg Oral TID WC  . montelukast  10 mg Oral QHS  . multivitamin  1 tablet Oral QHS  . pantoprazole  40 mg Oral Daily  . predniSONE  10 mg Oral Q breakfast  . primidone  50 mg Oral Daily  . sodium chloride flush  10-40 mL Intracatheter Q12H  . Thrombi-Pad  1 each Topical Once  . traZODone  50 mg Oral QHS   Continuous Infusions: . sodium chloride    . amiodarone 30 mg/hr (03/14/20 1632)  . ampicillin (OMNIPEN) IV 2 g (03/14/20 1439)  . heparin Stopped (03/12/20 1505)  . phenylephrine (NEO-SYNEPHRINE) Adult infusion Stopped (03/12/20 1435)   PRN Meds:.acetaminophen, albuterol, calcium carbonate, diphenhydrAMINE, docusate sodium, EPINEPHrine, heparin, metoprolol tartrate, nitroGLYCERIN, ondansetron (ZOFRAN) IV, sodium chloride flush    Objective: Weight change: -1.1 kg  Intake/Output Summary (Last 24 hours) at 03/14/2020 1656 Last data filed at 03/14/2020 1526 Gross per 24 hour  Intake 480 ml  Output 1709 ml    Net -1229 ml   Blood pressure 113/74, pulse 72, temperature 98.4 F (36.9 C), temperature source Oral, resp. rate (!) 24, height 5\' 2"  (1.575 m), weight 86.6 kg, SpO2 100 %. Temp:  [97.4 F (36.3 C)-98.4 F (36.9 C)] 98.4 F (36.9 C) (07/30 1603) Pulse Rate:  [38-131] 72 (07/30 1603) Resp:  [12-35] 24 (07/30 1603) BP: (95-140)/(58-96) 113/74 (07/30 1603) SpO2:  [97 %-100 %] 100 % (07/30 1603) Weight:  [86.6 kg-90 kg] 86.6 kg (07/30 1305)  Physical Exam: General: Alert and awake, lying in bed HEENT: anicteric sclera, EOMI CVS irregularly irregular rate with tachycardia and atrial fib with RVR Chest: , no wheezing, no respiratory distress Abdomen: soft non-distended,  Extremities: no edema or deformity noted bilaterally Skin: Ecchymoses, central line still in place neuro: nonfocal  CBC:    BMET Recent Labs    03/13/20 0214 03/14/20 0415  NA 135 140  K 4.0 3.9  CL 100 103  CO2 23 25  GLUCOSE 252* 213*  BUN 32* 49*  CREATININE 2.21* 3.18*  CALCIUM 8.7* 8.9     Liver Panel  Recent Labs    03/13/20 0214 03/14/20 0415  PROT 5.2*  --   ALBUMIN 3.0*  3.0* 2.8*  AST 1,563*  --   ALT 2,005*  --   ALKPHOS 140*  --   BILITOT 1.4*  --   BILIDIR 0.4*  --   IBILI 1.0*  --        Sedimentation Rate No results for input(s): ESRSEDRATE in the last 72 hours. C-Reactive Protein No results for input(s): CRP in the last 72 hours.  Micro Results: Recent Results (from the past 720 hour(s))  SARS Coronavirus 2 by RT PCR (hospital order, performed in [facility] hospital lab) Nasopharyngeal Nasopharyngeal Swab     Status: None   Collection Time: 02/20/20  6:59 AM   Specimen: Nasopharyngeal Swab  Result Value Ref Range Status   SARS Coronavirus 2 NEGATIVE NEGATIVE Final    Comment: (NOTE) SARS-CoV-2 target nucleic acids are NOT DETECTED.  The SARS-CoV-2 RNA is generally detectable in upper and lower respiratory specimens during the acute phase of infection. The  lowest concentration of SARS-CoV-2 viral copies this assay can detect is 250 copies / mL. A negative result does not preclude SARS-CoV-2  infection and should not be used as the sole basis for treatment or other patient management decisions.  A negative result may occur with improper specimen collection / handling, submission of specimen other than nasopharyngeal swab, presence of viral mutation(s) within the areas targeted by this assay, and inadequate number of viral copies (<250 copies / mL). A negative result must be combined with clinical observations, patient history, and epidemiological information.  Fact Sheet for Patients:   StrictlyIdeas.no  Fact Sheet for Healthcare Providers: BankingDealers.co.za  This test is not yet approved or  cleared by the [place] FDA and has been authorized for detection and/or diagnosis of SARS-CoV-2 by FDA under an Emergency Use Authorization (EUA).  This EUA will remain in effect (meaning this test can be used) for the duration of the COVID-19 declaration under Section 564(b)(1) of the Act, 21 U.S.C. section 360bbb-3(b)(1), unless the authorization is terminated or revoked sooner.  Performed at [facility], [place] [address], [place], [place] [address]   SARS Coronavirus 2 by RT PCR (hospital order, performed in [facility] hospital lab) Nasopharyngeal Nasopharyngeal Swab     Status: None   Collection Time: 03/07/20  1:13 AM   Specimen: Nasopharyngeal Swab  Result Value Ref Range Status   SARS Coronavirus 2 NEGATIVE NEGATIVE Final    Comment: (NOTE) SARS-CoV-2 target nucleic acids are NOT DETECTED.  The SARS-CoV-2 RNA is generally detectable in upper and lower respiratory specimens during the acute phase of infection. The lowest concentration of SARS-CoV-2 viral copies this assay can detect is 250 copies / mL. A negative result does not preclude SARS-CoV-2 infection and should not be  used as the sole basis for treatment or other patient management decisions.  A negative result may occur with improper specimen collection / handling, submission of specimen other than nasopharyngeal swab, presence of viral mutation(s) within the areas targeted by this assay, and inadequate number of viral copies (<250 copies / mL). A negative result must be combined with clinical observations, patient history, and epidemiological information.  Fact Sheet for Patients:   StrictlyIdeas.no  Fact Sheet for Healthcare Providers: BankingDealers.co.za  This test is not yet approved or  cleared by the [place] FDA and has been authorized for detection and/or diagnosis of SARS-CoV-2 by FDA under an Emergency Use Authorization (EUA).  This EUA will remain in effect (meaning this test can be used) for the duration of the COVID-19 declaration under Section 564(b)(1) of the Act, 21 U.S.C. section 360bbb-3(b)(1), unless the authorization is terminated or revoked sooner.  Performed at [facility], [address] [place], [place] [address]   SARS Coronavirus 2 by RT PCR (hospital order, performed in [facility] hospital lab) Nasopharyngeal Nasopharyngeal Swab     Status: None   Collection Time: 03/10/20 10:32 AM   Specimen: Nasopharyngeal Swab  Result Value Ref Range Status   SARS Coronavirus 2 NEGATIVE NEGATIVE Final    Comment: (NOTE) SARS-CoV-2 target nucleic acids are NOT DETECTED.  The SARS-CoV-2 RNA is generally detectable in upper and lower respiratory specimens during the acute phase of infection. The lowest concentration of SARS-CoV-2 viral copies this assay can detect is 250 copies / mL. A negative result does not preclude SARS-CoV-2 infection and should not be used as the sole basis for treatment or other patient management decisions.  A negative result may occur with improper specimen collection / handling, submission of specimen  other than nasopharyngeal swab, presence of viral mutation(s) within the areas targeted by this assay,  and inadequate number of viral copies (<250 copies / mL). A negative result must be combined with clinical observations, patient history, and epidemiological information.  Fact Sheet for Patients:   StrictlyIdeas.no  Fact Sheet for Healthcare Providers: BankingDealers.co.za  This test is not yet approved or  cleared by the [place] FDA and has been authorized for detection and/or diagnosis of SARS-CoV-2 by FDA under an Emergency Use Authorization (EUA).  This EUA will remain in effect (meaning this test can be used) for the duration of the COVID-19 declaration under Section 564(b)(1) of the Act, 21 U.S.C. section 360bbb-3(b)(1), unless the authorization is terminated or revoked sooner.  Performed at [facility], [place] [address], [place], [place] [address]   Blood Culture (routine x 2)     Status: Abnormal   Collection Time: 03/10/20 11:12 AM   Specimen: BLOOD  Result Value Ref Range Status   Specimen Description BLOOD LEFT ANTECUBITAL  Final   Special Requests   Final    BOTTLES DRAWN AEROBIC AND ANAEROBIC Blood Culture adequate volume   Culture  Setup Time   Final    GRAM POSITIVE COCCI IN CHAINS IN BOTH AEROBIC AND ANAEROBIC BOTTLES Organism ID to follow CRITICAL RESULT CALLED TO, READ BACK BY AND VERIFIED WITH: PHARMD VERANDA BRICK AT [place] ON 03/11/2020 Performed at [facility], [place] [address] [place], [place] [address]    Culture ENTEROCOCCUS FAECALIS (A)  Final   Report Status 03/12/2020 FINAL  Final   Organism ID, Bacteria ENTEROCOCCUS FAECALIS  Final      Susceptibility   Enterococcus faecalis - MIC*    AMPICILLIN <=2 SENSITIVE Sensitive     VANCOMYCIN 1 SENSITIVE Sensitive     GENTAMICIN SYNERGY SENSITIVE Sensitive     * ENTEROCOCCUS FAECALIS  Blood Culture ID Panel (Reflexed)      Status: Abnormal   Collection Time: 03/10/20 11:12 AM  Result Value Ref Range Status   Enterococcus species DETECTED (A) NOT DETECTED Final    Comment: CRITICAL RESULT CALLED TO, READ BACK BY AND VERIFIED WITH: PHARMD VERANDA BRICK AT 0052 BY MESSAN HOUEGNIFIO ON 03/11/2020    Vancomycin resistance NOT DETECTED NOT DETECTED Final   Listeria monocytogenes NOT DETECTED NOT DETECTED Final   Staphylococcus species NOT DETECTED NOT DETECTED Final   Staphylococcus aureus (BCID) NOT DETECTED NOT DETECTED Final   Streptococcus species NOT DETECTED NOT DETECTED Final   Streptococcus agalactiae NOT DETECTED NOT DETECTED Final   Streptococcus pneumoniae NOT DETECTED NOT DETECTED Final   Streptococcus pyogenes NOT DETECTED NOT DETECTED Final   Acinetobacter baumannii NOT DETECTED NOT DETECTED Final   Enterobacteriaceae species NOT DETECTED NOT DETECTED Final   Enterobacter cloacae complex NOT DETECTED NOT DETECTED Final   Escherichia coli NOT DETECTED NOT DETECTED Final   Klebsiella oxytoca NOT DETECTED NOT DETECTED Final   Klebsiella pneumoniae NOT DETECTED NOT DETECTED Final   Proteus species NOT DETECTED NOT DETECTED Final   Serratia marcescens NOT DETECTED NOT DETECTED Final   Haemophilus influenzae NOT DETECTED NOT DETECTED Final   Neisseria meningitidis NOT DETECTED NOT DETECTED Final   Pseudomonas aeruginosa NOT DETECTED NOT DETECTED Final   Candida albicans NOT DETECTED NOT DETECTED Final   Candida glabrata NOT DETECTED NOT DETECTED Final   Candida krusei NOT DETECTED NOT DETECTED Final   Candida parapsilosis NOT DETECTED NOT DETECTED Final   Candida tropicalis NOT DETECTED NOT DETECTED Final    Comment: Performed at [facility], [address], [place], [place] [address]  Blood Culture (routine x 2)     Status: Abnormal   Collection Time: 03/10/20 11:35 AM   Specimen: BLOOD LEFT FOREARM  Result Value Ref Range Status   Specimen Description BLOOD LEFT FOREARM  Final    Special Requests   Final    BOTTLES DRAWN AEROBIC AND ANAEROBIC Blood Culture adequate volume   Culture  Setup Time   Final    GRAM POSITIVE COCCI IN CHAINS IN BOTH AEROBIC AND ANAEROBIC BOTTLES CRITICAL VALUE NOTED.  VALUE IS CONSISTENT WITH PREVIOUSLY REPORTED AND CALLED VALUE.    Culture (A)  Final    ENTEROCOCCUS FAECALIS SUSCEPTIBILITIES PERFORMED ON PREVIOUS CULTURE WITHIN THE LAST 5 DAYS. Performed at [facility], [place] [address] [place], [place] [address]    Report Status 03/12/2020 FINAL  Final  MRSA PCR Screening     Status: Abnormal   Collection Time: 03/10/20  6:08 PM   Specimen: Nasopharyngeal  Result Value Ref Range Status   MRSA by PCR (A) NEGATIVE Final    INVALID, UNABLE TO DETERMINE THE PRESENCE OF TARGET DUE TO SPECIMEN INTEGRITY. RECOLLECTION REQUESTED.    Comment: A,KOTEY @2145  03/10/20 EB Performed at [place] [address] [place], [place] [address]   MRSA PCR Screening     Status: None   Collection Time: 03/11/20  8:17 AM   Specimen: Nasal Mucosa; Nasopharyngeal  Result Value Ref Range Status   MRSA by PCR NEGATIVE NEGATIVE Final    Comment:        The GeneXpert MRSA Assay (FDA approved for NASAL specimens only), is one component of a comprehensive MRSA colonization surveillance program. It is not intended to diagnose MRSA infection nor to guide or monitor treatment for MRSA infections. Performed at [facility], [place] [address] [place], [place] [address]   Urine culture     Status: Abnormal   Collection Time: 03/11/20  9:17 AM   Specimen: In/Out Cath Urine  Result Value Ref Range Status   Specimen Description IN/OUT CATH URINE  Final   Special Requests   Final    NONE Performed at [facility], [place] [address], [place], [place] [address]    Culture MULTIPLE SPECIES PRESENT, SUGGEST RECOLLECTION (A)  Final   Report Status 03/12/2020 FINAL  Final  Culture, blood (routine x 2)     Status: None (Preliminary result)     Collection Time: 03/12/20 12:13 PM   Specimen: BLOOD LEFT HAND  Result Value Ref Range Status   Specimen Description BLOOD LEFT HAND  Final   Special Requests   Final    BOTTLES DRAWN AEROBIC ONLY Blood Culture results may not be optimal due to an inadequate volume of blood received in culture bottles   Culture   Final    NO GROWTH 2 DAYS Performed at [facility], [place] [address] [place], [place] [address]    Report Status PENDING  Incomplete  Culture, blood (routine x 2)     Status: None (Preliminary result)   Collection Time: 03/12/20  7:17 PM   Specimen: BLOOD LEFT HAND  Result Value Ref Range Status   Specimen Description BLOOD LEFT HAND  Final   Special Requests   Final    BOTTLES DRAWN AEROBIC ONLY Blood Culture results may not be optimal due to an inadequate volume of blood received in culture bottles   Culture   Final    NO GROWTH 2  DAYS Performed at [facility], [place] [address] [place], [place] [address]    Report Status PENDING  Incomplete    Studies/Results: IR Removal Tun Cv Cath W/O FL  Result Date: 03/13/2020 INDICATION: End-stage renal disease on dialysis. Now bacteremia. Request for removal of tunneled hemodialysis catheter. The catheter was originally placed at an outside facility. EXAM: REMOVAL OF TUNNELED HEMODIALYSIS CATHETER MEDICATIONS: 1% lidocaine 3 mL COMPLICATIONS: None immediate. PROCEDURE: Informed written consent was obtained from the patient following an explanation of the procedure, risks, benefits and alternatives to treatment. A time out was performed prior to the initiation of the procedure. Maximal barrier sterile technique was utilized including caps, mask, sterile gowns, sterile gloves, large sterile drape, hand hygiene, and ChloraPrep. 1% lidocaine with epinephrine was injected under sterile conditions along the subcutaneous tunnel. Utilizing a combination of blunt dissection and gentle traction, the catheter was removed intact.  Hemostasis was obtained with manual compression. A dressing was placed. The patient tolerated the procedure well without immediate post procedural complication. IMPRESSION: Successful removal of tunneled dialysis catheter. Read by: Gareth Eagle, PA-C Electronically Signed   By: Corrie Mckusick D.O.   On: 03/13/2020 15:55   DG CHEST PORT 1 VIEW  Result Date: 03/13/2020 CLINICAL DATA:  Recent pleural effusion EXAM: PORTABLE CHEST 1 VIEW COMPARISON:  March 12, 2020 FINDINGS: Right subclavian catheter tip is in the superior vena cava near the cavoatrial junction. Catheter loops upon itself in the right axillary region, unchanged. Left central catheter tip is in the superior vena cava. No pneumothorax. There is a suspected small right pleural effusion. Lungs elsewhere are clear. Heart is enlarged with pulmonary vascularity normal. No adenopathy. There is degenerative change in each shoulder. IMPRESSION: 1. Suspect small right pleural effusion, stable. Lungs otherwise clear. 2. Stable cardiomegaly. Pulmonary vascularity currently appears normal. 3. The central catheter positions are unchanged. The right subclavian catheter loops upon itself in the right axillary region, unchanged from 1 day prior. Electronically Signed   By: Lowella Grip III M.D.   On: 03/13/2020 08:37   DG CHEST PORT 1 VIEW  Result Date: 03/12/2020 CLINICAL DATA:  Pleural effusion. EXAM: PORTABLE CHEST 1 VIEW COMPARISON:  Radiograph 03/10/2020.  Most recent CT 01/23/2020 FINDINGS: Right subclavian central catheter tip in the lower SVC. Lateral portion of catheter is looped. Left-sided dialysis catheter tip in the lower SVC. Hazy opacity at the right lung base likely represents pleural effusion. There is no visualized pneumothorax. Stable cardiomegaly. Vascular congestion. Interstitial opacities may represent pulmonary edema. IMPRESSION: 1. Right subclavian central catheter tip in the lower SVC. Lateral portion of catheter is looped, this portion  may be external to the patient. 2. Hazy opacity at the right lung base likely represents pleural effusion. 3. Stable cardiomegaly, vascular congestion and interstitial opacities, suggesting pulmonary edema. Electronically Signed   By: Keith Rake M.D.   On: 03/12/2020 17:31      Assessment/Plan:  INTERVAL HISTORY: HD catheter has been removed but central line remains in place she has now struggling with atrial fibrillation with RVR   Principal Problem:   Bacteremia Active Problems:   Type 2 diabetes mellitus with diabetic polyneuropathy, without long-term current use of insulin (HCC)   Diabetic peripheral neuropathy (HCC)   HYPERCHOLESTEROLEMIA   Gout, unspecified   Schizophrenia ([place])   Essential hypertension   Acute on chronic diastolic CHF (congestive heart failure) (HCC)   Hypothyroidism   ESRD (end stage renal disease) (HCC)   Elevated troponin   Acute  on chronic respiratory failure with hypoxia (HCC)   Atrial fibrillation with RVR (HCC)   Macrocytic anemia   Thrombocytopenia (HCC)   Hyperkalemia   Septic shock (HCC)   Elevated liver enzymes    Montanna TARINI CARRIER is a 69 y.o. female with history of end-stage renal disease on hemodialysis, COPD admitted with sepsis and found to have Enterococcus faecalis bacteremia. He was critically ill on pressors and CRRT. Sepsis has resolved and hemodialysis catheter is out. She now is struggling atrial fibrillation with a rapid ventricular response.  1. Enterococcal bacteremia  She still needs a " central line holiday" WHEN possible to cure her bacteremia  She also needs a transesophageal echocardiogram to ensure she does not have endocarditis  Dr. Baxter Flattery will follow up on culture data this weekend is available for questions.   LOS: 4 days   Alcide Evener 03/14/2020, 4:56 PM

## 2020-03-14 NOTE — Progress Notes (Signed)
OT Cancellation Note  Patient Details Name: [name] MRN: [id number] DOB: 09-Jan-1951   Cancelled Treatment:    Reason Eval/Treat Not Completed: Patient at procedure or test/ unavailable (Pt is at HD at this time, OT will continue to follow as available and appropriate.)  Zenovia Jarred, MSOT, OTR/L [facility] Office Number: [phone] Pager: [phone]  Zenovia Jarred 03/14/2020, 9:17 AM

## 2020-03-14 NOTE — Progress Notes (Addendum)
Progress Note  Patient Name: [name] Date of Encounter: 03/14/2020  [facility] Cardiologist: Rozann Lesches, MD   Subjective   Pt seen and examined in HD. Pt resting comfortably and wakes up to answer questions.  Inpatient Medications    Scheduled Meds:  ARIPiprazole  2 mg Oral Daily   aspirin EC  81 mg Oral Q breakfast   atorvastatin  20 mg Oral QHS   buPROPion  150 mg Oral Daily   calcitRIOL  0.25 mcg Oral Q M,W,F   calcium acetate  2,001 mg Oral TID WC   Chlorhexidine Gluconate Cloth  6 each Topical Daily   feeding supplement (ENSURE ENLIVE)  237 mL Oral BID BM   insulin aspart  0-15 Units Subcutaneous TID WC   insulin aspart  2 Units Subcutaneous TID WC   insulin detemir  5 Units Subcutaneous Daily   lactulose  10 g Oral BID   levETIRAcetam  250 mg Oral BID   levothyroxine  50 mcg Oral QAC breakfast   mouth rinse  15 mL Mouth Rinse BID   methylPREDNISolone (SOLU-MEDROL) injection  40 mg Intravenous Q6H   metoprolol tartrate       metoprolol tartrate  25 mg Oral BID   montelukast  10 mg Oral QHS   multivitamin  1 tablet Oral QHS   pantoprazole  40 mg Oral Daily   predniSONE  10 mg Oral Q breakfast   primidone  50 mg Oral Daily   sodium chloride flush  10-40 mL Intracatheter Q12H   Thrombi-Pad  1 each Topical Once   traZODone  50 mg Oral QHS   Continuous Infusions:  sodium chloride     amiodarone 30 mg/hr (03/14/20 0105)   ampicillin (OMNIPEN) IV 2 g (03/13/20 2331)   heparin Stopped (03/12/20 1505)   phenylephrine (NEO-SYNEPHRINE) Adult infusion Stopped (03/12/20 1435)   PRN Meds: acetaminophen, albuterol, calcium carbonate, diphenhydrAMINE, docusate sodium, EPINEPHrine, heparin, metoprolol tartrate, nitroGLYCERIN, ondansetron (ZOFRAN) IV, sodium chloride flush   Vital Signs    Vitals:   03/14/20 0923 03/14/20 0928 03/14/20 0930 03/14/20 1000  BP: (!) 136/96 (!) 140/88 (!) 129/80 (!) 111/90  Pulse: (!) 118 (!) 117 (!) 131 88  Resp: 12 20  (!) 24 (!) 35  Temp: 98 F (36.7 C)     TempSrc: Oral     SpO2: 99% 98% 98% 97%  Weight: 90 kg     Height:        Intake/Output Summary (Last 24 hours) at 03/14/2020 1037 Last data filed at 03/14/2020 0940 Gross per 24 hour  Intake 957.41 ml  Output   Net 957.41 ml   Last 3 Weights 03/14/2020 03/14/2020 03/13/2020  Weight (lbs) 198 lb 6.6 oz 194 lb 3.6 oz 192 lb 10.9 oz  Weight (kg) 90 kg 88.1 kg 87.4 kg      Telemetry    afib with ventricular rates in the 120-130s - Personally Reviewed  ECG    No new tracings - Personally Reviewed  Physical Exam   GEN: No acute distress.   Neck: No JVD Cardiac: irregular rhythm and tachycardic rate Respiratory: coarse sounds in bases GI: Soft, nontender, non-distended  MS: B LE edema; No deformity. Neuro:  Nonfocal  Psych: Normal affect   Labs    High Sensitivity Troponin:   Recent Labs  Lab 03/10/20 1032 03/10/20 1323  TROPONINIHS 42* 56*      Chemistry Recent Labs  Lab 03/10/20 1112 03/11/20 0335 03/12/20 0301 03/13/20 0214  03/14/20 0415  NA  --    < > 138 135 140  K  --    < > 4.2 4.0 3.9  CL  --    < > 102 100 103  CO2  --    < > 22 23 25   GLUCOSE  --    < > 271* 252* 213*  BUN  --    < > 24* 32* 49*  CREATININE  --    < > 1.83* 2.21* 3.18*  CALCIUM  --    < > 8.6* 8.7* 8.9  PROT 5.0*  --   --  5.2*  --   ALBUMIN 3.1*   < > 3.0* 3.0*  3.0* 2.8*  AST 432*  --   --  1,563*  --   ALT 330*  --   --  2,005*  --   ALKPHOS 99  --   --  140*  --   BILITOT 1.5*  --   --  1.4*  --   GFRNONAA  --    < > 28* 22* 14*  GFRAA  --    < > 32* 26* 17*  ANIONGAP  --    < > 14 12 12    < > = values in this interval not displayed.     Hematology Recent Labs  Lab 03/12/20 1521 03/13/20 0214 03/14/20 0415  WBC 11.0* 8.6 9.9  RBC 2.38* 2.44* 2.47*  HGB 8.5* 8.8* 9.1*  HCT 26.7* 27.7* 28.3*  MCV 112.2* 113.5* 114.6*  MCH 35.7* 36.1* 36.8*  MCHC 31.8 31.8 32.2  RDW 16.2* 16.2* 16.1*  PLT 48* 46* 51*    BNP Recent  Labs  Lab 03/10/20 1032  BNP 1,486.7*     DDimer No results for input(s): DDIMER in the last 168 hours.   Radiology    MR BRAIN WO CONTRAST  Result Date: 03/12/2020 CLINICAL DATA:  69 year old female with altered mental status. On anticoagulation for atrial fibrillation. EXAM: MRI HEAD WITHOUT CONTRAST TECHNIQUE: Multiplanar, multiecho pulse sequences of the brain and surrounding structures were obtained without intravenous contrast. COMPARISON:  Head CT yesterday.  Brain MRI 09/27/2018. FINDINGS: Brain: Stable cerebral volume. No restricted diffusion to suggest acute infarction. No midline shift, mass effect, evidence of mass lesion, ventriculomegaly, extra-axial collection or acute intracranial hemorrhage. Cervicomedullary junction and pituitary are within normal limits. Stable minimal nonspecific cerebral white matter T2 and FLAIR hyperintensity since 2020. Suggestion of a small chronic focus of encephalomalacia along the medial cerebellum near the vermis (series 6, image 7). No other cortical encephalomalacia. No chronic cerebral blood products are evident. Deep gray nuclei and brainstem remain normal. Vascular: Major intracranial vascular flow voids are stable since last year. Skull and upper cervical spine: Negative. Sinuses/Orbits: Stable and negative orbits. Small maxillary sinus mucous retention cysts are stable. Other: Trace mastoid fluid is stable since last year. Grossly normal visible internal auditory structures. Scalp and face soft tissues appear negative. IMPRESSION: 1.  No acute intracranial abnormality. 2. 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 Electronically Signed   By: Genevie Ann M.D.   On: 03/12/2020 15:09   IR Removal Tun Cv Cath W/O FL  Result Date: 03/13/2020 INDICATION: End-stage renal disease on dialysis. Now bacteremia. Request for removal of tunneled hemodialysis  catheter. The catheter was originally placed at an outside facility. EXAM: REMOVAL OF TUNNELED HEMODIALYSIS CATHETER MEDICATIONS: 1% lidocaine 3  mL COMPLICATIONS: None immediate. PROCEDURE: Informed written consent was obtained from the patient following an explanation of the procedure, risks, benefits and alternatives to treatment. A time out was performed prior to the initiation of the procedure. Maximal barrier sterile technique was utilized including caps, mask, sterile gowns, sterile gloves, large sterile drape, hand hygiene, and ChloraPrep. 1% lidocaine with epinephrine was injected under sterile conditions along the subcutaneous tunnel. Utilizing a combination of blunt dissection and gentle traction, the catheter was removed intact. Hemostasis was obtained with manual compression. A dressing was placed. The patient tolerated the procedure well without immediate post procedural complication. IMPRESSION: Successful removal of tunneled dialysis catheter. Read by: Gareth Eagle, PA-C Electronically Signed   By: Corrie Mckusick D.O.   On: 03/13/2020 15:55   DG CHEST PORT 1 VIEW  Result Date: 03/13/2020 CLINICAL DATA:  Recent pleural effusion EXAM: PORTABLE CHEST 1 VIEW COMPARISON:  March 12, 2020 FINDINGS: Right subclavian catheter tip is in the superior vena cava near the cavoatrial junction. Catheter loops upon itself in the right axillary region, unchanged. Left central catheter tip is in the superior vena cava. No pneumothorax. There is a suspected small right pleural effusion. Lungs elsewhere are clear. Heart is enlarged with pulmonary vascularity normal. No adenopathy. There is degenerative change in each shoulder. IMPRESSION: 1. Suspect small right pleural effusion, stable. Lungs otherwise clear. 2. Stable cardiomegaly. Pulmonary vascularity currently appears normal. 3. The central catheter positions are unchanged. The right subclavian catheter loops upon itself in the right axillary region, unchanged from 1  day prior. Electronically Signed   By: Lowella Grip III M.D.   On: 03/13/2020 08:37   DG CHEST PORT 1 VIEW  Result Date: 03/12/2020 CLINICAL DATA:  Pleural effusion. EXAM: PORTABLE CHEST 1 VIEW COMPARISON:  Radiograph 03/10/2020.  Most recent CT 01/23/2020 FINDINGS: Right subclavian central catheter tip in the lower SVC. Lateral portion of catheter is looped. Left-sided dialysis catheter tip in the lower SVC. Hazy opacity at the right lung base likely represents pleural effusion. There is no visualized pneumothorax. Stable cardiomegaly. Vascular congestion. Interstitial opacities may represent pulmonary edema. IMPRESSION: 1. Right subclavian central catheter tip in the lower SVC. Lateral portion of catheter is looped, this portion may be external to the patient. 2. Hazy opacity at the right lung base likely represents pleural effusion. 3. Stable cardiomegaly, vascular congestion and interstitial opacities, suggesting pulmonary edema. Electronically Signed   By: Keith Rake M.D.   On: 03/12/2020 17:31    Cardiac Studies   Echo 03/11/20:  1. Challenging to estimate EF with underlying tachycardia, however does  not appear severly reduced.   2. Left ventricular ejection fraction, by estimation, is 45 to 50%. The  left ventricle has mildly decreased function. The left ventricle  demonstrates global hypokinesis. Left ventricular diastolic parameters are  indeterminate.   3. Right ventricular systolic function is mildly reduced. The right  ventricular size is moderately enlarged. There is moderately elevated  pulmonary artery systolic pressure. The estimated right ventricular  systolic pressure is 29.9 mmHg.   4. Left atrial size was severely dilated.   5. Right atrial size was moderately dilated.   6. The mitral valve is normal in structure. Mild mitral valve  regurgitation. No evidence of mitral stenosis.   7. Tricuspid valve regurgitation is moderate to severe.   8. The aortic valve is  tricuspid. Aortic valve regurgitation is not  visualized. Mild to moderate aortic valve sclerosis/calcification is  present, without any evidence of aortic stenosis.  9. The inferior vena cava is dilated in size with <50% respiratory  variability, suggesting right atrial pressure of 15 mmHg.   Patient Profile     69 y.o. female with a hx of chronic diastolic HF, persistent atrial fibrillation/flutter on anticoagulation with Eliquis, anemia, mild to moderate aortic stenosis, ESRD on HD, HTN, HLD, bifascicular block (known RBBB + LAFB), COPD, DM 2, history of PE, seizure disorder, schizophrenia, Barrett's esophagus, memory loss and OSA who was seen yesterday in the emergency room for A. fib with RVR and hypotension sec related to septic shock  Assessment & Plan    Persistent atrial fibrillation with RVR - continue amiodarone and BB for rate control - will repeat LFTs per primary - she did not tolerated cardizem due to hypotensino - lopressor titrated to 25 mg BID, BP tolerated this - will continue to monitor post-HD today - IV heparin and eliquis has been stopped due to bleeding and thrombocytopenia - s/p 1U PLT and 2U FFP    Septic shock due to enterococcus faecalis - HD catheter is suspected source - positive blood cultures - tunneled catheter removed 03/13/20 - may need a TEE once more stable to rule out endocarditis   Acute on chronic respiratory failure with diastolic dysfunction - echo with EF of 45-50% - volume status managed with HD   Moderate-severe TR - aortic sclerosis without stenosis - TR could be contributing to dyspnea   Elevated LFTs - rechecking per primary - opt to continue amiodarone for now - suspect this is more related to shock   Disposition - question need for palliative care consult       For questions or updates, please contact [facility] Please consult [email or web address] for contact info under        Signed, Ledora Bottcher, PA   03/14/2020, 10:37 AM    Agree with note by Fabian Sharp PA-C  Continues to have Afib with RVR on Amio. On low dose BB. Anticoag D/Cd secondary to bleeding. Plats decreased as well probably secondary to sepsis. Rate control will be an issue limited by BP. May ultimately need EP input. Agree with Palliative Care.Will defer to the Primary Service to decide    Lorretta Harp, M.D., [place], [facility], [place], [place] [address] [place], [place]  [id number]  [phone] 03/14/2020 12:10 PM

## 2020-03-14 NOTE — Progress Notes (Addendum)
[facility] PROGRESS NOTE    Progress Note  [name]  YTK:[id number] DOB: [birth year] DOA: 03/10/2020 PCP: Glenda Chroman, MD     Brief Narrative:   [name] is an 69 y.o. female past medical history of chronic respiratory failure with hypoxia secondary to underlying COPD on 3 L of oxygen, A. fib on Eliquis end-stage renal disease on hemodialysis chronic diastolic heart failure moderate AS, who came into the hospital for hyperglycemia and tachycardia was found in A. fib with RVR, decompensated systolic heart failure and septic shock who presented to the ED with atrial fibrillation and RVR with a heart rate of 170 was started on a diltiazem drip on arrival to the ED heart rate improved however she developed progressive hypotension and was transitioned to amiodarone due to hypotension PCCM was consulted and started on pressors for septic shock due to right lower lobe pneumonia. 03/10/2020 blood cultures grew Enterococcus faecalis Currently on IV linezolid started on 03/11/2020 03/12/2019 one 2D echo that showed an EF of 45% with mildly reduced RV function Initiated on CRRT on 03/10/2020 Off pressors on 03/10/2020 Assessment/Plan:   Septic shock due to Enterococcus faecalis probably due to her HD catheter: ID was consulted he was sensitive to Vanco, was Zyvox,Now switched to ampicillin. Is now  off pressors. We will need a TEE to rule out vegetation. She will need a line holiday.  IR successfully remove her tunneled hemodialysis catheter on 03/13/2020. She continues to have a central line. Blood cultures on 03/10/2020 grew Enterococcus faecalis repeat blood cultures on 03/12/2020 are negative till date. Repeat surveillance blood cultures on 03/14/2020. Her cortisol level was 42, she was continued on her home dose of prednisone.  We will challenge her with IV steroids as she does not seem to be correcting with care and management of her condition, which makes me think of an  underlying relative adrenal insufficiency.  Bleeding subclavian line: She is transfused 1 unit of platelets and 2 units of fresh frozen plasma, Eliquis was held, INR has improved to 1.7 artificially due to fresh frozen plasma.  Platelet count is greater than 50,000 artificially after 1 unit of platelets. Probably pads were placed. Hold anticoagulants including heparin and Eliquis. Discontinue HD catheter on 03/13/2020.  Persistent atrial fibrillation with RVR: Probably driven by her infectious bacteremia, she is currently on amiodarone drip. Currently off IV pressors. He was started on metoprolol p.o. twice daily which did not adversely affect her blood pressure. Her heart rate continues to be greater than 130 persistently, will give her IV metoprolol as needed. Cardiology on board probably will titrate metoprolol as tolerated.  Acute on chronic respiratory failure with hypoxia acute decompensated diastolic heart failure: Her volume is being managed by nephrology.  Acute metabolic/toxic encephalopathy: Her ammonia is level currently on lactulose.  Neurology on board. Neurology recommended to continue home primidone and Keppra. She continues to be encephalopathic and confused. Multifactorial in the setting of Keppra infectious etiology. Neurology has been signed off.  End-stage renal disease: On hemodialysis Monday Tuesday and Thursday further management of her volume status per renal. For dialysis on 03/14/2020 per renal, we consulted IR to remove HD catheter on 03/13/2020  Transaminitis: Likely due to shock liver it is continued to worsen despite correcting hypotension. Right upper quadrant ultrasound showed no obstructive jaundice. She has not completed her loading dose of amiodarone. Getting hepatic function panel.  Elevated ammonia level: Continue lactulose.  Insulin-dependent diabetes mellitus type 2: She is currently on long-acting  insulin plus sliding scale blood sugar has  been ranging anywhere from 2 90-1 91, continue current regimen.  Elevated troponins: Likely due to demand ischemia, further management per cardiology. Titrating metoprolol up to control heart rate. Hypothyroidism: Continue Synthroid.  Rheumatoid arthritis: On chronic prednisone continue current regimen.  Gout, unspecified  Schizophrenia ([place])  Acute Thrombocytopenia ([place]): Unlikely to be HIT, likely due to infectious etiology plus or minus uremia..  We will continue to follow closely.  Hyperkalemia Per renal.  Now improved after dialysis.  Goals of care: It would be a good idea to get hospice imperative care involved discussed goals of care.  Stage II ulcer present on admission: RN Pressure Injury Documentation: Pressure Injury 03/10/20 Heel Left Unstageable - Full thickness tissue loss in which the base of the injury is covered by slough (yellow, tan, gray, green or brown) and/or eschar (tan, brown or black) in the wound bed. 3 cm x 2 cm (Active)  03/10/20 1730  Location: Heel  Location Orientation: Left  Staging: Unstageable - Full thickness tissue loss in which the base of the injury is covered by slough (yellow, tan, gray, green or brown) and/or eschar (tan, brown or black) in the wound bed.  Wound Description (Comments): 3 cm x 2 cm  Present on Admission: Yes    Estimated body mass index is 35.52 kg/m as calculated from the following:   Height as of this encounter: 5\' 2"  (1.575 m).   Weight as of this encounter: 88.1 kg. Malnutrition Type:  Nutrition Problem: Increased nutrient needs Etiology: acute illness   Malnutrition Characteristics:  Signs/Symptoms: estimated needs   Nutrition Interventions:  Interventions: Liberalize Diet, Magic cup, MVI, Ensure Enlive (each supplement provides 350kcal and 20 grams of protein)    DVT prophylaxis: none Family Communication:none Status is: Inpatient  Remains inpatient appropriate because:Hemodynamically  unstable   Dispo: The patient is from: Home              Anticipated d/c is to: Home              Anticipated d/c date is: > 3 days              Patient currently is not medically stable to d/c.        Code Status:     Code Status Orders  (From admission, onward)         Start     Ordered   03/10/20 1229  Full code  Continuous        03/10/20 1229        Code Status History    Date Active Date Inactive Code Status Order ID Comments User Context   01/23/2020 1928 01/26/2020 0003 Full Code [id number]  Vashti Hey, MD ED   11/22/2019 2141 11/27/2019 2110 Full Code [id number]  Bethena Roys, MD Inpatient   09/06/2019 0822 10/01/2019 2342 Full Code [id number]  Roxan Hockey, MD ED   11/28/2018 1635 11/29/2018 1904 Full Code [id number]  Murlean Iba, MD Inpatient   09/15/2018 1138 09/19/2018 1837 Full Code [id number]  Heath Lark D, DO ED   09/13/2018 0154 09/15/2018 1133 Full Code [id number]  Ripley Fraise, MD ED   08/16/2018 0603 08/20/2018 1453 Full Code [id number]  Aldean Jewett, MD Inpatient   07/27/2018 2032 08/01/2018 1538 Full Code [id number]  Arnell Asal, NP ED   07/14/2018 2209 07/15/2018 1634 Full Code [id number]  Vianne Bulls, MD ED   04/19/2018 0144 04/21/2018 1754 Full Code  [id number]  Reubin Milan, MD ED   01/12/2018 1428 01/14/2018 0135 Full Code [id number]  Lorella Nimrod, MD ED   10/12/2016 0306 10/13/2016 1730 Full Code [id number]  Edwin Dada, MD Inpatient   08/12/2016 0128 08/13/2016 1809 Full Code [id number]  Lily Kocher, MD Inpatient   11/11/2015 0032 11/12/2015 1751 Full Code [id number]  Bethena Roys, MD Inpatient   Advance Care Planning Activity        IV Access:    Peripheral IV   Procedures and diagnostic studies:   EEG  Result Date: 03/12/2020 Lora Havens, MD     03/12/2020 10:19 AM Patient Name: [name] MRN: [id number] Epilepsy Attending: Lora Havens Referring Physician/Provider: Dr.  Amie [place] Date: 03/12/2020 Duration: 24.37 minutes Patient history: 69 year old female with past medical history of catatonia, schizoaffective disorder as well as history of seizures who was noted to have a blank stare with some chewing movements of the mouth and not responding to questions lasting for about 15 minutes.  EEG to evaluate for seizures. Level of alertness: Awake AEDs during EEG study: LEV Technical aspects: This EEG study was done with scalp electrodes positioned according to the 10-20 International system of electrode placement. Electrical activity was acquired at a sampling rate of 500Hz  and reviewed with a high frequency filter of 70Hz  and a low frequency filter of 1Hz . EEG data were recorded continuously and digitally stored. Description: The posterior dominant rhythm consists of 6 Hz activity of moderate voltage (25-35 uV) seen predominantly in posterior head regions, symmetric and reactive to eye opening and eye closing. EEG showed continuous generalized 5 to 6 Hz theta slowing. Hyperventilation and photic stimulation were not performed.   ABNORMALITY -Continuous slow, generalized -Background slow IMPRESSION: This study is suggestive of moderate diffuse encephalopathy, nonspecific etiology. No seizures or epileptiform discharges were seen throughout the recording. Lora Havens   MR BRAIN WO CONTRAST  Result Date: 03/12/2020 CLINICAL DATA:  69 year old female with altered mental status. On anticoagulation for atrial fibrillation. EXAM: MRI HEAD WITHOUT CONTRAST TECHNIQUE: Multiplanar, multiecho pulse sequences of the brain and surrounding structures were obtained without intravenous contrast. COMPARISON:  Head CT yesterday.  Brain MRI 09/27/2018. FINDINGS: Brain: Stable cerebral volume. No restricted diffusion to suggest acute infarction. No midline shift, mass effect, evidence of mass lesion, ventriculomegaly, extra-axial collection or acute intracranial hemorrhage. Cervicomedullary  junction and pituitary are within normal limits. Stable minimal nonspecific cerebral white matter T2 and FLAIR hyperintensity since 2020. Suggestion of a small chronic focus of encephalomalacia along the medial cerebellum near the vermis (series 6, image 7). No other cortical encephalomalacia. No chronic cerebral blood products are evident. Deep gray nuclei and brainstem remain normal. Vascular: Major intracranial vascular flow voids are stable since last year. Skull and upper cervical spine: Negative. Sinuses/Orbits: Stable and negative orbits. Small maxillary sinus mucous retention cysts are stable. Other: Trace mastoid fluid is stable since last year. Grossly normal visible internal auditory structures. Scalp and face soft tissues appear negative. IMPRESSION: 1.  No acute intracranial abnormality. 2. 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 Electronically Signed   By: Genevie Ann M.D.   On: 03/12/2020 15:09   IR Removal Tun Cv Cath W/O FL  Result Date: 03/13/2020 INDICATION: End-stage renal disease on dialysis. Now bacteremia. Request for removal of tunneled hemodialysis catheter. The catheter was originally placed at an outside facility. EXAM: REMOVAL  OF TUNNELED HEMODIALYSIS CATHETER MEDICATIONS: 1% lidocaine 3 mL COMPLICATIONS: None immediate. PROCEDURE: Informed written consent was obtained from the patient following an explanation of the procedure, risks, benefits and alternatives to treatment. A time out was performed prior to the initiation of the procedure. Maximal barrier sterile technique was utilized including caps, mask, sterile gowns, sterile gloves, large sterile drape, hand hygiene, and ChloraPrep. 1% lidocaine with epinephrine was injected under sterile conditions along the subcutaneous tunnel. Utilizing a combination of blunt dissection and gentle traction, the catheter was removed intact.  Hemostasis was obtained with manual compression. A dressing was placed. The patient tolerated the procedure well without immediate post procedural complication. IMPRESSION: Successful removal of tunneled dialysis catheter. Read by: Gareth Eagle, PA-C Electronically Signed   By: Corrie Mckusick D.O.   On: 03/13/2020 15:55   DG CHEST PORT 1 VIEW  Result Date: 03/13/2020 CLINICAL DATA:  Recent pleural effusion EXAM: PORTABLE CHEST 1 VIEW COMPARISON:  March 12, 2020 FINDINGS: Right subclavian catheter tip is in the superior vena cava near the cavoatrial junction. Catheter loops upon itself in the right axillary region, unchanged. Left central catheter tip is in the superior vena cava. No pneumothorax. There is a suspected small right pleural effusion. Lungs elsewhere are clear. Heart is enlarged with pulmonary vascularity normal. No adenopathy. There is degenerative change in each shoulder. IMPRESSION: 1. Suspect small right pleural effusion, stable. Lungs otherwise clear. 2. Stable cardiomegaly. Pulmonary vascularity currently appears normal. 3. The central catheter positions are unchanged. The right subclavian catheter loops upon itself in the right axillary region, unchanged from 1 day prior. Electronically Signed   By: Lowella Grip III M.D.   On: 03/13/2020 08:37   DG CHEST PORT 1 VIEW  Result Date: 03/12/2020 CLINICAL DATA:  Pleural effusion. EXAM: PORTABLE CHEST 1 VIEW COMPARISON:  Radiograph 03/10/2020.  Most recent CT 01/23/2020 FINDINGS: Right subclavian central catheter tip in the lower SVC. Lateral portion of catheter is looped. Left-sided dialysis catheter tip in the lower SVC. Hazy opacity at the right lung base likely represents pleural effusion. There is no visualized pneumothorax. Stable cardiomegaly. Vascular congestion. Interstitial opacities may represent pulmonary edema. IMPRESSION: 1. Right subclavian central catheter tip in the lower SVC. Lateral portion of catheter is looped, this portion  may be external to the patient. 2. Hazy opacity at the right lung base likely represents pleural effusion. 3. Stable cardiomegaly, vascular congestion and interstitial opacities, suggesting pulmonary edema. Electronically Signed   By: Keith Rake M.D.   On: 03/12/2020 17:31     Medical Consultants:    None.  Anti-Infectives:   Ampicillin  Subjective:    [name] patient relates she does not know where she is at, she denies any pain or shortness of breath  Objective:    Vitals:   03/14/20 0000 03/14/20 0315 03/14/20 0447 03/14/20 0530  BP: (!) 115/86 (!) 98/62 (!) 109/58 114/73  Pulse: (!) 121 (!) 125 (!) 112 (!) 118  Resp: 21 22 21 22   Temp: 98.1 F (36.7 C) 98.3 F (36.8 C)  97.7 F (36.5 C)  TempSrc: Oral Oral  Oral  SpO2:  98% 98% 99%  Weight:    88.1 kg  Height:       SpO2: 99 % O2 Flow Rate (L/min): 3 L/min FiO2 (%): 40 %   Intake/Output Summary (Last 24 hours) at 03/14/2020 0723 Last data filed at 03/13/2020 1700 Gross per 24 hour  Intake 784.08 ml  Output --  Net 784.08  ml   Filed Weights   03/13/20 0500 03/13/20 1631 03/14/20 0530  Weight: 88.5 kg 87.4 kg 88.1 kg    Exam: General exam: In no acute distress. Respiratory system: Good air movement and clear to auscultation. Cardiovascular system: S1 & S2 heard, RRR. No JVD. Gastrointestinal system: Abdomen is nondistended, soft and nontender.  Extremities: No pedal edema. Skin: No rashes, lesions or ulcers  Data Reviewed:    Labs: Basic Metabolic Panel: Recent Labs  Lab 03/10/20 1048 03/10/20 1323 03/11/20 0335 03/11/20 0335 03/11/20 1517 03/11/20 1517 03/12/20 0300 03/12/20 0301 03/12/20 0301 03/13/20 0214 03/14/20 0415  NA   < >  --  140  --  135  --   --  138  --  135 140  K   < >  --  3.8   < > 3.8   < >  --  4.2   < > 4.0 3.9  CL   < >  --  105  --  102  --   --  102  --  100 103  CO2   < >  --  21*  --  21*  --   --  22  --  23 25  GLUCOSE   < >  --  111*  --   320*  --   --  271*  --  252* 213*  BUN   < >  --  36*  --  25*  --   --  24*  --  32* 49*  CREATININE   < >  --  2.61*  --  1.96*  --   --  1.83*  --  2.21* 3.18*  CALCIUM   < >  --  8.3*  --  8.3*  --   --  8.6*  --  8.7* 8.9  MG  --  2.3 2.1  --   --   --  2.5*  --   --   --   --   PHOS  --   --  3.2  --  2.9  --   --  2.4*  --  2.3* 3.7   < > = values in this interval not displayed.   GFR Estimated Creatinine Clearance: 17.5 mL/min (A) (by C-G formula based on SCr of 3.18 mg/dL (H)). Liver Function Tests: Recent Labs  Lab 03/10/20 1112 03/10/20 1112 03/11/20 0335 03/11/20 1517 03/12/20 0301 03/13/20 0214 03/14/20 0415  AST 432*  --   --   --   --  1,563*  --   ALT 330*  --   --   --   --  2,005*  --   ALKPHOS 99  --   --   --   --  140*  --   BILITOT 1.5*  --   --   --   --  1.4*  --   PROT 5.0*  --   --   --   --  5.2*  --   ALBUMIN 3.1*   < > 2.9* 2.8* 3.0* 3.0*  3.0* 2.8*   < > = values in this interval not displayed.   No results for input(s): LIPASE, AMYLASE in the last 168 hours. Recent Labs  Lab 03/10/20 1630 03/13/20 0352  AMMONIA 74* 50*   Coagulation profile Recent Labs  Lab 03/10/20 1112 03/11/20 0335 03/12/20 1058 03/13/20 0857 03/14/20 0415  INR 2.1* 2.3* 2.9* 2.0* 1.7*   COVID-19 Labs  No results for input(s):  DDIMER, FERRITIN, LDH, CRP in the last 72 hours.  Lab Results  Component Value Date   SARSCOV2NAA NEGATIVE 03/10/2020   [place] NEGATIVE 03/07/2020   [place] NEGATIVE 02/20/2020   [place] NEGATIVE 01/23/2020    CBC: Recent Labs  Lab 03/11/20 0335 03/12/20 0300 03/12/20 1521 03/13/20 0214 03/14/20 0415  WBC 20.9* 15.3* 11.0* 8.6 9.9  HGB 11.2* 10.4* 8.5* 8.8* 9.1*  HCT 35.4* 32.5* 26.7* 27.7* 28.3*  MCV 115.3* 114.0* 112.2* 113.5* 114.6*  PLT 171 80* 48* 46* 51*   Cardiac Enzymes: No results for input(s): CKTOTAL, CKMB, CKMBINDEX, TROPONINI in the last 168 hours. BNP (last 3 results) No results for input(s):  PROBNP in the last 8760 hours. CBG: Recent Labs  Lab 03/13/20 0616 03/13/20 1116 03/13/20 1559 03/13/20 2117 03/14/20 0612  GLUCAP 240* 213* 207* 293* 191*   D-Dimer: No results for input(s): DDIMER in the last 72 hours. Hgb A1c: No results for input(s): HGBA1C in the last 72 hours. Lipid Profile: No results for input(s): CHOL, HDL, LDLCALC, TRIG, CHOLHDL, LDLDIRECT in the last 72 hours. Thyroid function studies: No results for input(s): TSH, T4TOTAL, T3FREE, THYROIDAB in the last 72 hours.  Invalid input(s): FREET3 Anemia work up: No results for input(s): VITAMINB12, FOLATE, FERRITIN, TIBC, IRON, RETICCTPCT in the last 72 hours. Sepsis Labs: Recent Labs  Lab 03/10/20 1032 03/10/20 1112 03/10/20 1323 03/11/20 0335 03/11/20 0335 03/12/20 0300 03/12/20 1521 03/13/20 0214 03/13/20 0351 03/13/20 0625 03/14/20 0415  PROCALCITON  --   --  18.11 8.99  --  6.06  --   --  7.43  --   --   WBC   < >  --   --  20.9*   < > 15.3* 11.0* 8.6  --   --  9.9  LATICACIDVEN  --  4.9* 2.7*  --   --   --   --   --  2.7* 3.4*  --    < > = values in this interval not displayed.   Microbiology Recent Results (from the past 240 hour(s))  SARS Coronavirus 2 by RT PCR (hospital order, performed in [facility] hospital lab) Nasopharyngeal Nasopharyngeal Swab     Status: None   Collection Time: 03/07/20  1:13 AM   Specimen: Nasopharyngeal Swab  Result Value Ref Range Status   SARS Coronavirus 2 NEGATIVE NEGATIVE Final    Comment: (NOTE) SARS-CoV-2 target nucleic acids are NOT DETECTED.  The SARS-CoV-2 RNA is generally detectable in upper and lower respiratory specimens during the acute phase of infection. The lowest concentration of SARS-CoV-2 viral copies this assay can detect is 250 copies / mL. A negative result does not preclude SARS-CoV-2 infection and should not be used as the sole basis for treatment or other patient management decisions.  A negative result may occur with improper  specimen collection / handling, submission of specimen other than nasopharyngeal swab, presence of viral mutation(s) within the areas targeted by this assay, and inadequate number of viral copies (<250 copies / mL). A negative result must be combined with clinical observations, patient history, and epidemiological information.  Fact Sheet for Patients:   StrictlyIdeas.no  Fact Sheet for Healthcare Providers: BankingDealers.co.za  This test is not yet approved or  cleared by the [place] FDA and has been authorized for detection and/or diagnosis of SARS-CoV-2 by FDA under an Emergency Use Authorization (EUA).  This EUA will remain in effect (meaning this test can be used) for the duration of the COVID-19 declaration under Section  564(b)(1) of the Act, 21 U.S.C. section 360bbb-3(b)(1), unless the authorization is terminated or revoked sooner.  Performed at [facility], [address], [place], [place] [address]   SARS Coronavirus 2 by RT PCR (hospital order, performed in [facility] hospital lab) Nasopharyngeal Nasopharyngeal Swab     Status: None   Collection Time: 03/10/20 10:32 AM   Specimen: Nasopharyngeal Swab  Result Value Ref Range Status   SARS Coronavirus 2 NEGATIVE NEGATIVE Final    Comment: (NOTE) SARS-CoV-2 target nucleic acids are NOT DETECTED.  The SARS-CoV-2 RNA is generally detectable in upper and lower respiratory specimens during the acute phase of infection. The lowest concentration of SARS-CoV-2 viral copies this assay can detect is 250 copies / mL. A negative result does not preclude SARS-CoV-2 infection and should not be used as the sole basis for treatment or other patient management decisions.  A negative result may occur with improper specimen collection / handling, submission of specimen other than nasopharyngeal swab, presence of viral mutation(s) within the areas targeted by this assay, and inadequate  number of viral copies (<250 copies / mL). A negative result must be combined with clinical observations, patient history, and epidemiological information.  Fact Sheet for Patients:   StrictlyIdeas.no  Fact Sheet for Healthcare Providers: BankingDealers.co.za  This test is not yet approved or  cleared by the [place] FDA and has been authorized for detection and/or diagnosis of SARS-CoV-2 by FDA under an Emergency Use Authorization (EUA).  This EUA will remain in effect (meaning this test can be used) for the duration of the COVID-19 declaration under Section 564(b)(1) of the Act, 21 U.S.C. section 360bbb-3(b)(1), unless the authorization is terminated or revoked sooner.  Performed at [facility], [place] [address] [place], [place] [address]   Blood Culture (routine x 2)     Status: Abnormal   Collection Time: 03/10/20 11:12 AM   Specimen: BLOOD  Result Value Ref Range Status   Specimen Description BLOOD LEFT ANTECUBITAL  Final   Special Requests   Final    BOTTLES DRAWN AEROBIC AND ANAEROBIC Blood Culture adequate volume   Culture  Setup Time   Final    GRAM POSITIVE COCCI IN CHAINS IN BOTH AEROBIC AND ANAEROBIC BOTTLES Organism ID to follow CRITICAL RESULT CALLED TO, READ BACK BY AND VERIFIED WITH: PHARMD VERANDA BRICK AT [place] ON 03/11/2020 Performed at [facility], [place] [address] [place],  [address]    Culture ENTEROCOCCUS FAECALIS (A)  Final   Report Status 03/12/2020 FINAL  Final   Organism ID, Bacteria ENTEROCOCCUS FAECALIS  Final      Susceptibility   Enterococcus faecalis - MIC*    AMPICILLIN <=2 SENSITIVE Sensitive     VANCOMYCIN 1 SENSITIVE Sensitive     GENTAMICIN SYNERGY SENSITIVE Sensitive     * ENTEROCOCCUS FAECALIS  Blood Culture ID Panel (Reflexed)     Status: Abnormal   Collection Time: 03/10/20 11:12 AM  Result Value Ref Range Status   Enterococcus species  DETECTED (A) NOT DETECTED Final    Comment: CRITICAL RESULT CALLED TO, READ BACK BY AND VERIFIED WITH: PHARMD VERANDA BRICK AT 0052 BY MESSAN HOUEGNIFIO ON 03/11/2020    Vancomycin resistance NOT DETECTED NOT DETECTED Final   Listeria monocytogenes NOT DETECTED NOT DETECTED Final   Staphylococcus species NOT DETECTED NOT DETECTED Final   Staphylococcus aureus (BCID) NOT DETECTED NOT DETECTED Final   Streptococcus species NOT DETECTED NOT DETECTED Final  Streptococcus agalactiae NOT DETECTED NOT DETECTED Final   Streptococcus pneumoniae NOT DETECTED NOT DETECTED Final   Streptococcus pyogenes NOT DETECTED NOT DETECTED Final   Acinetobacter baumannii NOT DETECTED NOT DETECTED Final   Enterobacteriaceae species NOT DETECTED NOT DETECTED Final   Enterobacter cloacae complex NOT DETECTED NOT DETECTED Final   Escherichia coli NOT DETECTED NOT DETECTED Final   Klebsiella oxytoca NOT DETECTED NOT DETECTED Final   Klebsiella pneumoniae NOT DETECTED NOT DETECTED Final   Proteus species NOT DETECTED NOT DETECTED Final   Serratia marcescens NOT DETECTED NOT DETECTED Final   Haemophilus influenzae NOT DETECTED NOT DETECTED Final   Neisseria meningitidis NOT DETECTED NOT DETECTED Final   Pseudomonas aeruginosa NOT DETECTED NOT DETECTED Final   Candida albicans NOT DETECTED NOT DETECTED Final   Candida glabrata NOT DETECTED NOT DETECTED Final   Candida krusei NOT DETECTED NOT DETECTED Final   Candida parapsilosis NOT DETECTED NOT DETECTED Final   Candida tropicalis NOT DETECTED NOT DETECTED Final    Comment: Performed at [facility], [place] [address] [place], [place] [address]  Blood Culture (routine x 2)     Status: Abnormal   Collection Time: 03/10/20 11:35 AM   Specimen: BLOOD LEFT FOREARM  Result Value Ref Range Status   Specimen Description BLOOD LEFT FOREARM  Final   Special Requests   Final    BOTTLES DRAWN AEROBIC AND ANAEROBIC Blood Culture adequate volume   Culture  Setup Time    Final    GRAM POSITIVE COCCI IN CHAINS IN BOTH AEROBIC AND ANAEROBIC BOTTLES CRITICAL VALUE NOTED.  VALUE IS CONSISTENT WITH PREVIOUSLY REPORTED AND CALLED VALUE.    Culture (A)  Final    ENTEROCOCCUS FAECALIS SUSCEPTIBILITIES PERFORMED ON PREVIOUS CULTURE WITHIN THE LAST 5 DAYS. Performed at [facility], [place] [address], [place], [place] [address]    Report Status 03/12/2020 FINAL  Final  MRSA PCR Screening     Status: Abnormal   Collection Time: 03/10/20  6:08 PM   Specimen: Nasopharyngeal  Result Value Ref Range Status   MRSA by PCR (A) NEGATIVE Final    INVALID, UNABLE TO DETERMINE THE PRESENCE OF TARGET DUE TO SPECIMEN INTEGRITY. RECOLLECTION REQUESTED.    Comment: A,KOTEY @2145  03/10/20 EB Performed at [facility] [address], [place], [place] [address]   MRSA PCR Screening     Status: None   Collection Time: 03/11/20  8:17 AM   Specimen: Nasal Mucosa; Nasopharyngeal  Result Value Ref Range Status   MRSA by PCR NEGATIVE NEGATIVE Final    Comment:        The GeneXpert MRSA Assay (FDA approved for NASAL specimens only), is one component of a comprehensive MRSA colonization surveillance program. It is not intended to diagnose MRSA infection nor to guide or monitor treatment for MRSA infections. Performed at [facility], [place] [address], [place], [place] [address]   Urine culture     Status: Abnormal   Collection Time: 03/11/20  9:17 AM   Specimen: In/Out Cath Urine  Result Value Ref Range Status   Specimen Description IN/OUT CATH URINE  Final   Special Requests   Final    NONE Performed at [facility], [place] [address] [place], [place] [address]    Culture MULTIPLE SPECIES PRESENT, SUGGEST RECOLLECTION (A)  Final   Report Status 03/12/2020 FINAL  Final  Culture, blood (routine x 2)     Status: None (Preliminary result)  Collection Time: 03/12/20 12:13 PM   Specimen: BLOOD LEFT HAND  Result Value Ref Range Status   Specimen  Description BLOOD LEFT HAND  Final   Special Requests   Final    BOTTLES DRAWN AEROBIC ONLY Blood Culture results may not be optimal due to an inadequate volume of blood received in culture bottles   Culture   Final    NO GROWTH 1 DAY Performed at [facility], [place] [address], [place], [place] [address]    Report Status PENDING  Incomplete  Culture, blood (routine x 2)     Status: None (Preliminary result)   Collection Time: 03/12/20  7:17 PM   Specimen: BLOOD LEFT HAND  Result Value Ref Range Status   Specimen Description BLOOD LEFT HAND  Final   Special Requests   Final    BOTTLES DRAWN AEROBIC ONLY Blood Culture results may not be optimal due to an inadequate volume of blood received in culture bottles   Culture   Final    NO GROWTH < 24 HOURS Performed at [facility], [place] [address] [place], [place] [address]    Report Status PENDING  Incomplete     Medications:   . ARIPiprazole  2 mg Oral Daily  . aspirin EC  81 mg Oral Q breakfast  . atorvastatin  20 mg Oral QHS  . buPROPion  150 mg Oral Daily  . calcitRIOL  0.25 mcg Oral Q M,W,F  . calcium acetate  2,001 mg Oral TID WC  . Chlorhexidine Gluconate Cloth  6 each Topical Daily  . feeding supplement (ENSURE ENLIVE)  237 mL Oral BID BM  . insulin aspart  0-15 Units Subcutaneous TID WC  . insulin aspart  2 Units Subcutaneous TID WC  . insulin detemir  5 Units Subcutaneous Daily  . lactulose  10 g Oral BID  . levETIRAcetam  250 mg Oral BID  . levothyroxine  50 mcg Oral QAC breakfast  . mouth rinse  15 mL Mouth Rinse BID  . metoprolol tartrate  25 mg Oral BID  . montelukast  10 mg Oral QHS  . multivitamin  1 tablet Oral QHS  . pantoprazole  40 mg Oral Daily  . predniSONE  10 mg Oral Q breakfast  . primidone  50 mg Oral Daily  . sodium chloride flush  10-40 mL Intracatheter Q12H  . Thrombi-Pad  1 each Topical Once  . traZODone  50 mg Oral QHS   Continuous Infusions: . sodium chloride    . amiodarone 30  mg/hr (03/14/20 0105)  . ampicillin (OMNIPEN) IV 2 g (03/13/20 2331)  . heparin Stopped (03/12/20 1505)  . phenylephrine (NEO-SYNEPHRINE) Adult infusion Stopped (03/12/20 1435)      LOS: 4 days   Charlynne Cousins  [facility]  03/14/2020, 7:23 AM

## 2020-03-14 NOTE — Progress Notes (Signed)
  Paged by nursing for Afib with rates in the 130-160s following HD with hypotension with SBP in the 90s. In consultation with Dr. Gwenlyn Found, will consult EP.   Ledora Bottcher, PA-C 03/14/2020, 3:03 PM [place] [address] [place], [place] [address]

## 2020-03-14 NOTE — Care Management Important Message (Signed)
Important Message  Patient Details  Name: JARROD BODKINS MRN: [id number] Date of Birth: 11-13-1950   Medicare Important Message Given:  Yes     Zenon Mayo, RN 03/14/2020, 4:09 PM

## 2020-03-14 NOTE — Procedures (Signed)
I was present at this dialysis session. I have reviewed the session itself and made appropriate changes.   TDC removed yesterday.    Cannulated AVG w/o problems. 3K, 2L UF goal. Pt doing well.    Filed Weights   03/13/20 0500 03/13/20 1631 03/14/20 0530  Weight: 88.5 kg 87.4 kg 88.1 kg    Recent Labs  Lab 03/14/20 0415  NA 140  K 3.9  CL 103  CO2 25  GLUCOSE 213*  BUN 49*  CREATININE 3.18*  CALCIUM 8.9  PHOS 3.7    Recent Labs  Lab 03/12/20 1521 03/13/20 0214 03/14/20 0415  WBC 11.0* 8.6 9.9  HGB 8.5* 8.8* 9.1*  HCT 26.7* 27.7* 28.3*  MCV 112.2* 113.5* 114.6*  PLT 48* 46* 51*    Scheduled Meds: . ARIPiprazole  2 mg Oral Daily  . aspirin EC  81 mg Oral Q breakfast  . atorvastatin  20 mg Oral QHS  . buPROPion  150 mg Oral Daily  . calcitRIOL  0.25 mcg Oral Q M,W,F  . calcium acetate  2,001 mg Oral TID WC  . Chlorhexidine Gluconate Cloth  6 each Topical Daily  . feeding supplement (ENSURE ENLIVE)  237 mL Oral BID BM  . insulin aspart  0-15 Units Subcutaneous TID WC  . insulin aspart  2 Units Subcutaneous TID WC  . insulin detemir  5 Units Subcutaneous Daily  . lactulose  10 g Oral BID  . levETIRAcetam  250 mg Oral BID  . levothyroxine  50 mcg Oral QAC breakfast  . mouth rinse  15 mL Mouth Rinse BID  . methylPREDNISolone (SOLU-MEDROL) injection  40 mg Intravenous Q6H  . metoprolol tartrate      . metoprolol tartrate  25 mg Oral BID  . montelukast  10 mg Oral QHS  . multivitamin  1 tablet Oral QHS  . pantoprazole  40 mg Oral Daily  . predniSONE  10 mg Oral Q breakfast  . primidone  50 mg Oral Daily  . sodium chloride flush  10-40 mL Intracatheter Q12H  . Thrombi-Pad  1 each Topical Once  . traZODone  50 mg Oral QHS   Continuous Infusions: . sodium chloride    . amiodarone 30 mg/hr (03/14/20 0105)  . ampicillin (OMNIPEN) IV 2 g (03/13/20 2331)  . heparin Stopped (03/12/20 1505)  . phenylephrine (NEO-SYNEPHRINE) Adult infusion Stopped (03/12/20 1435)    PRN Meds:.acetaminophen, albuterol, calcium carbonate, diphenhydrAMINE, docusate sodium, EPINEPHrine, heparin, metoprolol tartrate, nitroGLYCERIN, ondansetron (ZOFRAN) IV, sodium chloride flush   Pearson Grippe  MD 03/14/2020, 9:32 AM

## 2020-03-14 NOTE — Progress Notes (Signed)
Inpatient Diabetes Program Recommendations  AACE/ADA: New Consensus Statement on Inpatient Glycemic Control (2015)  Target Ranges:  Prepandial:   less than 140 mg/dL      Peak postprandial:   less than 180 mg/dL (1-2 hours)      Critically ill patients:  140 - 180 mg/dL   Lab Results  Component Value Date   GLUCAP 191 (H) 03/14/2020   HGBA1C 8.0 (H) 03/10/2020    Review of Glycemic Control Results for [name], [name] (MRN [id number]) as of 03/14/2020 10:40  Ref. Range 03/13/2020 06:16 03/13/2020 11:16 03/13/2020 15:59 03/13/2020 21:17 03/14/2020 06:12  Glucose-Capillary Latest Ref Range: 70 - 99 mg/dL 240 (H) 213 (H) 207 (H) 293 (H) 191 (H)   Diabetes history:  DM2  Current orders for Inpatient glycemic control:  Levemir 5 units daily  Novolog 0-15 units tid  Novolog 2 units tid with meals Solumedrol 40 mg q6h Prednisone 10 mg (1 dose this am)  Inpatient Diabetes Program Recommendations:    Levemir 8 units daily Novolog 0-9 units TID Novolog 0-5 units QHS Novolog meal coverage 5 units TID with meals if eats at least 50%  Will continue to follow while inpatient.  Thank you, Reche Dixon, RN, BSN Diabetes Coordinator Inpatient Diabetes Program [phone] (team pager from 8a-5p)

## 2020-03-14 NOTE — Progress Notes (Signed)
Patient came back from HD with high HR 130-160 nonsustained. Paged primary care Aileen Fass MD and [facility] PA cardiology. Received new order from Dr Olevia Bowens.  Will continue to monitor patient

## 2020-03-15 DIAGNOSIS — I4891 Unspecified atrial fibrillation: Secondary | ICD-10-CM | POA: Diagnosis not present

## 2020-03-15 DIAGNOSIS — R0602 Shortness of breath: Secondary | ICD-10-CM | POA: Diagnosis not present

## 2020-03-15 DIAGNOSIS — A4181 Sepsis due to Enterococcus: Secondary | ICD-10-CM | POA: Diagnosis not present

## 2020-03-15 LAB — GLUCOSE, CAPILLARY
Glucose-Capillary: 287 mg/dL — ABNORMAL HIGH (ref 70–99)
Glucose-Capillary: 384 mg/dL — ABNORMAL HIGH (ref 70–99)
Glucose-Capillary: 455 mg/dL — ABNORMAL HIGH (ref 70–99)
Glucose-Capillary: 437 mg/dL — ABNORMAL HIGH (ref 70–99)
Glucose-Capillary: 382 mg/dL — ABNORMAL HIGH (ref 70–99)

## 2020-03-15 LAB — COMPREHENSIVE METABOLIC PANEL
ALT: 1118 U/L — ABNORMAL HIGH (ref 0–44)
AST: 181 U/L — ABNORMAL HIGH (ref 15–41)
Albumin: 2.6 g/dL — ABNORMAL LOW (ref 3.5–5.0)
Alkaline Phosphatase: 129 U/L — ABNORMAL HIGH (ref 38–126)
Anion gap: 13 (ref 5–15)
BUN: 38 mg/dL — ABNORMAL HIGH (ref 8–23)
CO2: 25 mmol/L (ref 22–32)
Calcium: 8.6 mg/dL — ABNORMAL LOW (ref 8.9–10.3)
Chloride: 100 mmol/L (ref 98–111)
Creatinine, Ser: 2.99 mg/dL — ABNORMAL HIGH (ref 0.44–1.00)
GFR calc Af Amer: 18 mL/min — ABNORMAL LOW (ref >60)
GFR calc non Af Amer: 15 mL/min — ABNORMAL LOW (ref >60)
Glucose, Bld: 326 mg/dL — ABNORMAL HIGH (ref 70–99)
Potassium: 3.9 mmol/L (ref 3.5–5.1)
Sodium: 138 mmol/L (ref 135–145)
Total Bilirubin: 1.2 mg/dL (ref 0.3–1.2)
Total Protein: 4.8 g/dL — ABNORMAL LOW (ref 6.5–8.1)

## 2020-03-15 LAB — CBC
HCT: 27.1 % — ABNORMAL LOW (ref 36.0–46.0)
Hemoglobin: 8.6 g/dL — ABNORMAL LOW (ref 12.0–15.0)
MCH: 35.8 pg — ABNORMAL HIGH (ref 26.0–34.0)
MCHC: 31.7 g/dL (ref 30.0–36.0)
MCV: 112.9 fL — ABNORMAL HIGH (ref 80.0–100.0)
Platelets: 59 10*3/uL — ABNORMAL LOW (ref 150–400)
RBC: 2.4 MIL/uL — ABNORMAL LOW (ref 3.87–5.11)
RDW: 16 % — ABNORMAL HIGH (ref 11.5–15.5)
WBC: 6.5 10*3/uL (ref 4.0–10.5)
nRBC: 1.2 % — ABNORMAL HIGH (ref 0.0–0.2)

## 2020-03-15 LAB — PHOSPHORUS: Phosphorus: 3.7 mg/dL (ref 2.5–4.6)

## 2020-03-15 LAB — COOXEMETRY PANEL
Carboxyhemoglobin: 1.2 % (ref 0.5–1.5)
Methemoglobin: 1 % (ref 0.0–1.5)
O2 Saturation: 75.6 %
Total hemoglobin: 9.2 g/dL — ABNORMAL LOW (ref 12.0–16.0)

## 2020-03-15 MED ORDER — METOPROLOL TARTRATE 50 MG PO TABS
50.0000 mg | ORAL_TABLET | Freq: Two times a day (BID) | ORAL | Status: DC
  Administered 2020-03-15 – 2020-03-17 (×4): 50 mg via ORAL
  Filled 2020-03-15 (×4): qty 1

## 2020-03-15 MED ORDER — INSULIN ASPART 100 UNIT/ML ~~LOC~~ SOLN
2.0000 [IU] | Freq: Three times a day (TID) | SUBCUTANEOUS | Status: DC
  Administered 2020-03-15 – 2020-03-18 (×8): 2 [IU] via SUBCUTANEOUS

## 2020-03-15 MED ORDER — INSULIN ASPART 100 UNIT/ML ~~LOC~~ SOLN
10.0000 [IU] | Freq: Once | SUBCUTANEOUS | Status: AC
  Administered 2020-03-15: 10 [IU] via SUBCUTANEOUS

## 2020-03-15 MED ORDER — INSULIN ASPART 100 UNIT/ML ~~LOC~~ SOLN
0.0000 [IU] | Freq: Three times a day (TID) | SUBCUTANEOUS | Status: DC
  Administered 2020-03-15: 6 [IU] via SUBCUTANEOUS
  Administered 2020-03-15 – 2020-03-16 (×2): 5 [IU] via SUBCUTANEOUS
  Administered 2020-03-16 (×2): 6 [IU] via SUBCUTANEOUS
  Administered 2020-03-17: 3 [IU] via SUBCUTANEOUS
  Administered 2020-03-18: 2 [IU] via SUBCUTANEOUS
  Administered 2020-03-18 – 2020-03-19 (×2): 1 [IU] via SUBCUTANEOUS
  Administered 2020-03-20: 2 [IU] via SUBCUTANEOUS
  Administered 2020-03-21: 1 [IU] via SUBCUTANEOUS
  Administered 2020-03-21: 2 [IU] via SUBCUTANEOUS
  Administered 2020-03-22 – 2020-03-23 (×3): 1 [IU] via SUBCUTANEOUS
  Administered 2020-03-24: 0 [IU] via SUBCUTANEOUS

## 2020-03-15 MED ORDER — INSULIN ASPART 100 UNIT/ML ~~LOC~~ SOLN
0.0000 [IU] | Freq: Every day | SUBCUTANEOUS | Status: DC
  Administered 2020-03-15: 5 [IU] via SUBCUTANEOUS
  Administered 2020-03-16: 3 [IU] via SUBCUTANEOUS
  Administered 2020-03-17: 4 [IU] via SUBCUTANEOUS
  Administered 2020-03-18: 2 [IU] via SUBCUTANEOUS
  Administered 2020-03-19: 3 [IU] via SUBCUTANEOUS
  Administered 2020-03-21 – 2020-03-23 (×2): 2 [IU] via SUBCUTANEOUS

## 2020-03-15 MED ORDER — INSULIN DETEMIR 100 UNIT/ML ~~LOC~~ SOLN: 20.0000 [IU] | Freq: Every day | SUBCUTANEOUS | Status: DC

## 2020-03-15 MED ORDER — INSULIN DETEMIR 100 UNIT/ML ~~LOC~~ SOLN: 10.0000 [IU] | Freq: Every day | SUBCUTANEOUS | Status: DC

## 2020-03-15 MED ORDER — RESOURCE THICKENUP CLEAR PO POWD
ORAL | Status: DC | PRN
  Filled 2020-03-15: qty 125

## 2020-03-15 MED ORDER — INSULIN DETEMIR 100 UNIT/ML ~~LOC~~ SOLN
10.0000 [IU] | Freq: Every day | SUBCUTANEOUS | Status: DC
  Administered 2020-03-16: 10 [IU] via SUBCUTANEOUS
  Filled 2020-03-15: qty 0.1

## 2020-03-15 MED ORDER — DILTIAZEM HCL 60 MG PO TABS
60.0000 mg | ORAL_TABLET | Freq: Four times a day (QID) | ORAL | Status: DC
  Administered 2020-03-16 – 2020-03-17 (×7): 60 mg via ORAL
  Filled 2020-03-15 (×7): qty 1

## 2020-03-15 NOTE — Progress Notes (Signed)
Progress Note  Patient Name: [name] Date of Encounter: 03/15/2020  [facility] Cardiologist: Rozann Lesches, MD   Subjective   No complaints  Inpatient Medications    Scheduled Meds: . ARIPiprazole  2 mg Oral Daily  . aspirin EC  81 mg Oral Q breakfast  . buPROPion  150 mg Oral Daily  . calcitRIOL  0.25 mcg Oral Q M,W,F  . calcium acetate  2,001 mg Oral TID WC  . Chlorhexidine Gluconate Cloth  6 each Topical Daily  . diltiazem  30 mg Oral Q6H  . feeding supplement (ENSURE ENLIVE)  237 mL Oral BID BM  . insulin aspart  0-15 Units Subcutaneous TID WC  . insulin aspart  2 Units Subcutaneous TID WC  . insulin detemir  5 Units Subcutaneous Daily  . lactulose  10 g Oral BID  . levETIRAcetam  250 mg Oral BID  . levothyroxine  50 mcg Oral QAC breakfast  . mouth rinse  15 mL Mouth Rinse BID  . metoprolol tartrate  25 mg Oral BID  . midodrine  2.5 mg Oral TID WC  . montelukast  10 mg Oral QHS  . multivitamin  1 tablet Oral QHS  . pantoprazole  40 mg Oral Daily  . predniSONE  10 mg Oral Q breakfast  . primidone  50 mg Oral Daily  . sodium chloride flush  10-40 mL Intracatheter Q12H  . Thrombi-Pad  1 each Topical Once  . traZODone  50 mg Oral QHS   Continuous Infusions: . sodium chloride    . amiodarone 30 mg/hr (03/15/20 0530)  . ampicillin (OMNIPEN) IV 2 g (03/15/20 0100)  . phenylephrine (NEO-SYNEPHRINE) Adult infusion Stopped (03/12/20 1435)   PRN Meds: acetaminophen, albuterol, calcium carbonate, diphenhydrAMINE, docusate sodium, EPINEPHrine, heparin, metoprolol tartrate, nitroGLYCERIN, ondansetron (ZOFRAN) IV, Resource ThickenUp Clear, sodium chloride flush   Vital Signs    Vitals:   03/14/20 2000 03/15/20 0000 03/15/20 0400 03/15/20 0528  BP:  120/77 (!) 133/81 124/74  Pulse:  (!) 125 (!) 112   Resp:  15 22   Temp: 98.7 F (37.1 C) (!) 97.4 F (36.3 C) 98.2 F (36.8 C)   TempSrc: Oral Oral Oral   SpO2:  99% 100%   Weight:   87.6 kg   Height:         Intake/Output Summary (Last 24 hours) at 03/15/2020 0850 Last data filed at 03/15/2020 0300 Gross per 24 hour  Intake 1552.08 ml  Output 1709 ml  Net -156.92 ml   Last 3 Weights 03/15/2020 03/14/2020 03/14/2020  Weight (lbs) 193 lb 2 oz 190 lb 14.7 oz 198 lb 6.6 oz  Weight (kg) 87.6 kg 86.6 kg 90 kg      Telemetry    afib elevated rates - Personally Reviewed  ECG    n/a - Personally Reviewed  Physical Exam   GEN: No acute distress.   Neck: No JVD Cardiac: irreg, tachy.  Respiratory: Clear to auscultation bilaterally. GI: Soft, nontender, non-distended  MS: No edema; No deformity. Neuro:  Nonfocal  Psych: Normal affect   Labs    High Sensitivity Troponin:   Recent Labs  Lab 03/10/20 1032 03/10/20 1323  TROPONINIHS 42* 56*      Chemistry Recent Labs  Lab 03/13/20 0214 03/13/20 0214 03/14/20 0415 03/14/20 1812 03/15/20 0500  NA 135  --  140  --  138  K 4.0  --  3.9  --  3.9  CL 100  --  103  --  100  CO2 23  --  25  --  25  GLUCOSE 252*  --  213*  --  326*  BUN 32*  --  49*  --  38*  CREATININE 2.21*  --  3.18*  --  2.99*  CALCIUM 8.7*  --  8.9  --  8.6*  PROT 5.2*  --   --  4.9* 4.8*  ALBUMIN 3.0*  3.0*   < > 2.8* 2.8* 2.6*  AST 1,563*  --   --  366* 181*  ALT 2,005*  --   --  1,393* 1,118*  ALKPHOS 140*  --   --  145* 129*  BILITOT 1.4*  --   --  1.6* 1.2  GFRNONAA 22*  --  14*  --  15*  GFRAA 26*  --  17*  --  18*  ANIONGAP 12  --  12  --  13   < > = values in this interval not displayed.     Hematology Recent Labs  Lab 03/13/20 0214 03/14/20 0415 03/15/20 0500  WBC 8.6 9.9 6.5  RBC 2.44* 2.47* 2.40*  HGB 8.8* 9.1* 8.6*  HCT 27.7* 28.3* 27.1*  MCV 113.5* 114.6* 112.9*  MCH 36.1* 36.8* 35.8*  MCHC 31.8 32.2 31.7  RDW 16.2* 16.1* 16.0*  PLT 46* 51* 59*    BNP Recent Labs  Lab 03/10/20 1032  BNP 1,486.7*     DDimer No results for input(s): DDIMER in the last 168 hours.   Radiology    IR Removal Tun Cv Cath W/O  FL  Result Date: 03/13/2020 INDICATION: End-stage renal disease on dialysis. Now bacteremia. Request for removal of tunneled hemodialysis catheter. The catheter was originally placed at an outside facility. EXAM: REMOVAL OF TUNNELED HEMODIALYSIS CATHETER MEDICATIONS: 1% lidocaine 3 mL COMPLICATIONS: None immediate. PROCEDURE: Informed written consent was obtained from the patient following an explanation of the procedure, risks, benefits and alternatives to treatment. A time out was performed prior to the initiation of the procedure. Maximal barrier sterile technique was utilized including caps, mask, sterile gowns, sterile gloves, large sterile drape, hand hygiene, and ChloraPrep. 1% lidocaine with epinephrine was injected under sterile conditions along the subcutaneous tunnel. Utilizing a combination of blunt dissection and gentle traction, the catheter was removed intact. Hemostasis was obtained with manual compression. A dressing was placed. The patient tolerated the procedure well without immediate post procedural complication. IMPRESSION: Successful removal of tunneled dialysis catheter. Read by: Gareth Eagle, PA-C Electronically Signed   By: Corrie Mckusick D.O.   On: 03/13/2020 15:55    Cardiac Studies    Patient Profile     69 y.o. female with a hx of chronic diastolic HF, persistent atrial fibrillation/flutter on anticoagulation with Eliquis, anemia, mild to moderate aortic stenosis, ESRD on HD, HTN, HLD, bifascicular block(known RBBB+LAFB), COPD, DM 2, history of PE, seizure disorder, schizophrenia, Barrett's esophagus, memory loss and OSAwho was seen yesterday in the emergency room for A. fib with RVR and hypotension sec related to septic shock  Assessment & Plan    1. Persistent afib with RVR - has been on amio gtt - rate control has been difficult due to soft bp's particularly with HD - anticoag stopped due to low platelets and bleeding at dialysis site  -seen by EP, recs for  continued amio drip and short acting dilt. Limited options given her ESRD, current sepsis, low platelets/bleeding and inability to anticoagulate. Consider TEE/DCCV when able to anticoagulate  -currently on amio gtt. Dilt 30mg   every 6 hrs, lopressor 25mg  bid. BP's look good.  -tachy drive should decrease also as sepsis improves  - no great additional options. Just started on midodrine which may allow better tolerance and potential titration of av nodal agents.   2. ESRD - per nephrology   3. Septic shock due to enterococcus faecalis  4. Hypotension - started on midodrine  6. Transaminitis - LFTs trending down, likely related to sepsis  For questions or updates, please contact [place] Please consult [email or web address] for contact info under        Signed, Carlyle Dolly, MD  03/15/2020, 8:50 AM

## 2020-03-15 NOTE — Plan of Care (Signed)
  Problem: Clinical Measurements: Goal: Respiratory complications will improve Outcome: Progressing   Problem: Safety: Goal: Ability to remain free from injury will improve Outcome: Progressing   

## 2020-03-15 NOTE — Progress Notes (Signed)
Admit: 03/10/2020 LOS: 5  41F ESRD with septic shock, AFib/RVR, hyperkalemia, enterococcal bacteremia  Subjective:  . HD yesterday, successful cannulation of AV graft . Waxing and waning mental status . This morning no complaints, finished off her pancakes  07/30 0701 - 07/31 0700 In: 1552.1 [P.O.:360; I.V.:992.1; IV Piggyback:200] Out: 1709   Filed Weights   03/14/20 0923 03/14/20 1305 03/15/20 0400  Weight: 90 kg 86.6 kg 87.6 kg    Scheduled Meds: . ARIPiprazole  2 mg Oral Daily  . aspirin EC  81 mg Oral Q breakfast  . buPROPion  150 mg Oral Daily  . calcitRIOL  0.25 mcg Oral Q M,W,F  . calcium acetate  2,001 mg Oral TID WC  . Chlorhexidine Gluconate Cloth  6 each Topical Daily  . diltiazem  30 mg Oral Q6H  . feeding supplement (ENSURE ENLIVE)  237 mL Oral BID BM  . insulin aspart  0-5 Units Subcutaneous QHS  . insulin aspart  0-6 Units Subcutaneous TID WC  . insulin aspart  2 Units Subcutaneous TID WC  . [START ON 03/16/2020] insulin detemir  10 Units Subcutaneous Daily  . levETIRAcetam  250 mg Oral BID  . levothyroxine  50 mcg Oral QAC breakfast  . mouth rinse  15 mL Mouth Rinse BID  . metoprolol tartrate  25 mg Oral BID  . midodrine  2.5 mg Oral TID WC  . montelukast  10 mg Oral QHS  . multivitamin  1 tablet Oral QHS  . pantoprazole  40 mg Oral Daily  . predniSONE  10 mg Oral Q breakfast  . primidone  50 mg Oral Daily  . sodium chloride flush  10-40 mL Intracatheter Q12H  . Thrombi-Pad  1 each Topical Once  . traZODone  50 mg Oral QHS   Continuous Infusions: . sodium chloride    . amiodarone 30 mg/hr (03/15/20 0530)  . ampicillin (OMNIPEN) IV 2 g (03/15/20 0100)  . phenylephrine (NEO-SYNEPHRINE) Adult infusion Stopped (03/12/20 1435)   PRN Meds:.acetaminophen, albuterol, calcium carbonate, diphenhydrAMINE, docusate sodium, EPINEPHrine, heparin, metoprolol tartrate, nitroGLYCERIN, ondansetron (ZOFRAN) IV, Resource ThickenUp Clear, sodium chloride flush  Current  Labs: reviewed    Physical Exam:  Blood pressure (!) 137/67, pulse (!) 125, temperature 98.2 F (36.8 C), temperature source Oral, resp. rate 22, height 5\' 2"  (1.575 m), weight 87.6 kg, SpO2 100 %. Chronically ill appearing Tachycardic, irregular, no rub Coarse breath sounds bilaterally Left IJ TDC C/D/I Right upper arm AV graft with bruit and thrill Soft, nontender  [facility] Bath2/2.5, Dialyzerunknown, Heparindoes not appear to receive heparin with her treatment, only in the catheter. Accessusing TDC.   A 1. ESRD [facility] 1. CRRT 7/26 - 7/28 2. iHD is currently on MWF schedule: 4h, 2K, 2L UF, no heparin, using RUE AVG 16g 350/600 3. Tunneled HD catheter removed 7/29 2. Septic shock 2/2 enterococcal bacteremia, resolved shock; not VRE; RCID following, on ampicillin; needs TEE 3.  RUE AVG placed 7/7, mature and usable 4. AFib with RVR, amio + systemic heparin, cardiology following 5. HfPEF 6. Hypoxic RF on [place] 7. AMS waxing and waning 8. DM2 9. RA on steroids 10. TCP 11. 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 Anemia, will need to resume ESA  P . Tentative next dialysis on 8/2 . Medication Issues; o Preferred narcotic agents for pain control are hydromorphone, fentanyl, and methadone. Morphine should not be used.  o Baclofen  should be avoided o Avoid oral sodium phosphate and magnesium citrate based laxatives / bowel preps    Pearson Grippe MD 03/15/2020, 10:26 AM  Recent Labs  Lab 03/13/20 0214 03/14/20 0415 03/15/20 0500  NA 135 140 138  K 4.0 3.9 3.9  CL 100 103 100  CO2 23 25 25   GLUCOSE 252* 213* 326*  BUN 32* 49* 38*  CREATININE 2.21* 3.18* 2.99*  CALCIUM 8.7* 8.9 8.6*  PHOS 2.3* 3.7 3.7   Recent Labs  Lab 03/13/20 0214 03/14/20 0415 03/15/20 0500  WBC 8.6 9.9 6.5  HGB 8.8* 9.1* 8.6*  HCT 27.7* 28.3* 27.1*  MCV 113.5*  114.6* 112.9*  PLT 46* 51* 59*

## 2020-03-15 NOTE — Progress Notes (Signed)
Physical Therapy Treatment Patient Details Name: [name] MRN: [id number] DOB: [birth year] Today's Date: 03/15/2020    History of Present Illness 69 yo admitted from SNF with SOB and AFib with RVR, decompensated heart failure and septic shock with metabolic encephalopathy. PMHx: CHF, AFib, ESRD, RA, Schizophrenia    PT Comments    Pt supine in bed on arrival.   HR remains elevated but decreased from yesterday.  HR ranged from 113 bpm to 139 bpm.  No symptoms reported from patient this session.  Performed multiple transfers with poor tolerance to activity due to fatigue and weakness.  Pt left in bed in chair position with alarm set.     Follow Up Recommendations  SNF;Supervision/Assistance - 24 hour     Equipment Recommendations  None recommended by PT    Recommendations for Other Services       Precautions / Restrictions Precautions Precautions: Fall;Other (comment) Precaution Comments: watch HR Restrictions Weight Bearing Restrictions: No    Mobility  Bed Mobility Overal bed mobility: Needs Assistance Bed Mobility: Supine to Sit;Sit to Supine Rolling: Mod assist   Supine to sit: Mod assist     General bed mobility comments: Pt required assistance to advance LEs to edge of bed and elevate trunk into a seated position.  Once back to bed required assistance for rolling to readjust pads.  Able to bridge in bed with min assistance.  Transfers Overall transfer level: Needs assistance Equipment used:  (held to back of chair for transfers.) Transfers: Sit to/from Stand Sit to Stand: Max assist         General transfer comment: Pt performed x 3 to back of recliner chair and x 3 face to face to move hips up to [facility].  Pt required heavy mod-max assistance. to achieve standing.  Pt only able to stand 10-15 sec each round due to poor quad strength and fatigue.  Ambulation/Gait Ambulation/Gait assistance:  (unable to progress steps.)               Stairs              Wheelchair Mobility    Modified Rankin (Stroke Patients Only)       Balance Overall balance assessment: Needs assistance Sitting-balance support: Feet supported Sitting balance-Leahy Scale: Fair Sitting balance - Comments: close minguard for sitting blance      Standing balance-Leahy Scale: Poor                              Cognition Arousal/Alertness: Awake/alert Behavior During Therapy: WFL for tasks assessed/performed Overall Cognitive Status: Within Functional Limits for tasks assessed                                 General Comments: for basic tasks assessed today      Exercises      General Comments        Pertinent Vitals/Pain Pain Assessment: No/denies pain    Home Living                      Prior Function            PT Goals (current goals can now be found in the care plan section) Acute Rehab PT Goals Patient Stated Goal: return to facility, return to walking PT Goal Formulation: With patient Potential to Achieve Goals: Fair Progress towards  PT goals: Progressing toward goals    Frequency    Min 2X/week      PT Plan Current plan remains appropriate    Co-evaluation              AM-PAC PT "6 Clicks" Mobility   Outcome Measure  Help needed turning from your back to your side while in a flat bed without using bedrails?: A Lot Help needed moving from lying on your back to sitting on the side of a flat bed without using bedrails?: A Lot Help needed moving to and from a bed to a chair (including a wheelchair)?: Total Help needed standing up from a chair using your arms (e.g., wheelchair or bedside chair)?: A Lot Help needed to walk in hospital room?: Total Help needed climbing 3-5 steps with a railing? : Total 6 Click Score: 9    End of Session Equipment Utilized During Treatment: Gait belt Activity Tolerance: Patient tolerated treatment well Patient left: in bed;with call bell/phone  within reach Nurse Communication: Mobility status PT Visit Diagnosis: Other abnormalities of gait and mobility (R26.89);Muscle weakness (generalized) (M62.81)     Time: 6151-8343 PT Time Calculation (min) (ACUTE ONLY): 20 min  Charges:  $Therapeutic Activity: 8-22 mins                     Erasmo Leventhal , PTA Acute Rehabilitation Services Pager [phone] Office [phone]     Siddhi Dornbush Eli Hose 03/15/2020, 4:39 PM

## 2020-03-15 NOTE — Progress Notes (Signed)
[facility] PROGRESS NOTE    Progress Note  [name]  XBM:[id number] DOB: 08-09-1951 DOA: 03/10/2020 PCP: Glenda Chroman, MD     Brief Narrative:   [name] is an 69 y.o. female past medical history of chronic respiratory failure with hypoxia secondary to underlying COPD on 3 L of oxygen, A. fib on Eliquis end-stage renal disease on hemodialysis chronic diastolic heart failure moderate AS, who came into the hospital for hyperglycemia and tachycardia was found in A. fib with RVR, decompensated systolic heart failure and septic shock who presented to the ED with atrial fibrillation and RVR with a heart rate of 170 was started on a diltiazem drip on arrival to the ED heart rate improved however she developed progressive hypotension and was transitioned to amiodarone due to hypotension PCCM was consulted and started on pressors for septic shock due to right lower lobe pneumonia. 03/10/2020 blood cultures grew Enterococcus faecalis Changed to IV linezolid started on 03/11/2020-7.29.2021 7.29.2021 ampicillin. IR successfully remove her tunneled hemodialysis catheter on 03/13/2020. 03/12/2019 one 2D echo that showed an EF of 45% with mildly reduced RV function Initiated on CRRT on 03/10/2020 Off pressors on 03/10/2020 Assessment/Plan:   Septic shock due to Enterococcus faecalis probably due to her HD catheter: ID was consulted he was sensitive to Vanco, then switch to Zyvox,Now switched to ampicillin. We will need a TEE to rule out vegetation. She will need a line holiday.   She continues to have a central line. Repeat blood cultures on 03/12/2020 and 7.30.2021 are negative till date.  Bleeding subclavian line: She is transfused 1 unit of platelets and 2 units of fresh frozen plasma, Eliquis was held, INR has improved to 1.7 artificially due to fresh frozen plasma.  Platelet count is greater than 50,000 artificially after 1 unit of platelets. Probably pads were placed. Continue  to hold anticoagulation. Now resolved.  Persistent atrial fibrillation with RVR: Probably driven by her infectious bacteremia, she is currently on amiodarone drip. Due to heart control heart rate EP was consulted recommended to continue amiodarone, metoprolol and she was started on diltiazem.  Acute on chronic respiratory failure with hypoxia acute decompensated diastolic heart failure: Her volume is being managed by nephrology.  Acute metabolic/toxic encephalopathy: Her ammonia is level currently on lactulose.  Neurology on board. Neurology recommended to continue home primidone and Keppra. Encephalopathy is slowly resolving. Multifactorial in the setting of Keppra infectious etiology.  End-stage renal disease: On hemodialysis Monday Tuesday and Thursday further management of her volume status per renal. For dialysis on 03/14/2020 per renal, we consulted IR to remove HD catheter on 03/13/2020  Transaminitis: Likely due to shock liver it is continued to worsen despite correcting hypotension. Right upper quadrant ultrasound showed no obstructive jaundice. Transaminases are improving.  Elevated ammonia level: Continue lactulose.  Insulin-dependent diabetes mellitus type 2: Blood glucose continues to be persistently elevated, increase long-acting insulin change her sliding scale to with meals.  Elevated troponins: Likely due to demand ischemia, further management per cardiology. Titrating metoprolol up to control heart rate. Hypothyroidism: Continue Synthroid.  Rheumatoid arthritis: On chronic prednisone continue current regimen.  Gout, unspecified  Schizophrenia ([place])  Acute Thrombocytopenia ([place]): Unlikely to be HIT, likely due to infectious etiology plus or minus uremia. Platelet is trending up slowly.  Hyperkalemia Per renal.  Now improved after dialysis.  Goals of care: It would be a good idea to get hospice imperative care involved discussed goals of  care.  Stage II ulcer present on  admission: RN Pressure Injury Documentation: Pressure Injury 03/10/20 Heel Left Unstageable - Full thickness tissue loss in which the base of the injury is covered by slough (yellow, tan, gray, green or brown) and/or eschar (tan, brown or black) in the wound bed. 3 cm x 2 cm (Active)  03/10/20 1730  Location: Heel  Location Orientation: Left  Staging: Unstageable - Full thickness tissue loss in which the base of the injury is covered by slough (yellow, tan, gray, green or brown) and/or eschar (tan, brown or black) in the wound bed.  Wound Description (Comments): 3 cm x 2 cm  Present on Admission: Yes    Estimated body mass index is 35.32 kg/m as calculated from the following:   Height as of this encounter: 5\' 2"  (1.575 m).   Weight as of this encounter: 87.6 kg. Malnutrition Type:  Nutrition Problem: Increased nutrient needs Etiology: acute illness   Malnutrition Characteristics:  Signs/Symptoms: estimated needs   Nutrition Interventions:  Interventions: Liberalize Diet, Magic cup, MVI, Ensure Enlive (each supplement provides 350kcal and 20 grams of protein)    DVT prophylaxis: none Family Communication:none Status is: Inpatient  Remains inpatient appropriate because:Hemodynamically unstable   Dispo: The patient is from: Home              Anticipated d/c is to: Home              Anticipated d/c date is: > 3 days              Patient currently is not medically stable to d/c.        Code Status:     Code Status Orders  (From admission, onward)         Start     Ordered   03/10/20 1229  Full code  Continuous        03/10/20 1229        Code Status History    Date Active Date Inactive Code Status Order ID Comments User Context   01/23/2020 1928 01/26/2020 0003 Full Code [id number]  Vashti Hey, MD ED   11/22/2019 2141 11/27/2019 2110 Full Code [id number]  Bethena Roys, MD Inpatient   09/06/2019 0822  10/01/2019 2342 Full Code [id number]  Roxan Hockey, MD ED   11/28/2018 1635 11/29/2018 1904 Full Code [id number]  Murlean Iba, MD Inpatient   09/15/2018 1138 09/19/2018 1837 Full Code [id number]  Heath Lark D, DO ED   09/13/2018 0154 09/15/2018 1133 Full Code [id number]  Ripley Fraise, MD ED   08/16/2018 0603 08/20/2018 1453 Full Code [id number]  Aldean Jewett, MD Inpatient   07/27/2018 2032 08/01/2018 1538 Full Code [id number]  Arnell Asal, NP ED   07/14/2018 2209 07/15/2018 1634 Full Code [id number]  Vianne Bulls, MD ED   04/19/2018 0144 04/21/2018 1754 Full Code [id number]  Reubin Milan, MD ED   01/12/2018 1428 01/14/2018 0135 Full Code [id number]  Lorella Nimrod, MD ED   10/12/2016 0306 10/13/2016 1730 Full Code [id number]  Edwin Dada, MD Inpatient   08/12/2016 0128 08/13/2016 1809 Full Code [id number]  Lily Kocher, MD Inpatient   11/11/2015 0032 11/12/2015 1751 Full Code [id number]  Bethena Roys, MD Inpatient   Advance Care Planning Activity        IV Access:    Peripheral IV   Procedures and diagnostic studies:   IR Removal Tun Cv Cath W/O FL  Result Date: 03/13/2020 INDICATION: End-stage renal disease on dialysis. Now bacteremia. Request for  removal of tunneled hemodialysis catheter. The catheter was originally placed at an outside facility. EXAM: REMOVAL OF TUNNELED HEMODIALYSIS CATHETER MEDICATIONS: 1% lidocaine 3 mL COMPLICATIONS: None immediate. PROCEDURE: Informed written consent was obtained from the patient following an explanation of the procedure, risks, benefits and alternatives to treatment. A time out was performed prior to the initiation of the procedure. Maximal barrier sterile technique was utilized including caps, mask, sterile gowns, sterile gloves, large sterile drape, hand hygiene, and ChloraPrep. 1% lidocaine with epinephrine was injected under sterile conditions along the subcutaneous tunnel. Utilizing a combination of blunt dissection and  gentle traction, the catheter was removed intact. Hemostasis was obtained with manual compression. A dressing was placed. The patient tolerated the procedure well without immediate post procedural complication. IMPRESSION: Successful removal of tunneled dialysis catheter. Read by: Gareth Eagle, PA-C Electronically Signed   By: Corrie Mckusick D.O.   On: 03/13/2020 15:55     Medical Consultants:    None.  Anti-Infectives:   Ampicillin  Subjective:    [name] feels better denies any shortness of breath.  Objective:    Vitals:   03/15/20 0400 03/15/20 0528 03/15/20 0800 03/15/20 0900  BP: (!) 133/81 124/74 (!) 132/86 (!) 137/67  Pulse: (!) 112  (!) 115 (!) 125  Resp: 22     Temp: 98.2 F (36.8 C)   98.2 F (36.8 C)  TempSrc: Oral   Oral  SpO2: 100%  97% 100%  Weight: 87.6 kg     Height:       SpO2: 100 % O2 Flow Rate (L/min): 3 L/min FiO2 (%): 40 %   Intake/Output Summary (Last 24 hours) at 03/15/2020 0959 Last data filed at 03/15/2020 0300 Gross per 24 hour  Intake 1552.08 ml  Output 1709 ml  Net -156.92 ml   Filed Weights   03/14/20 0923 03/14/20 1305 03/15/20 0400  Weight: 90 kg 86.6 kg 87.6 kg    Exam: General exam: In no acute distress. Respiratory system: Good air movement and clear to auscultation. Cardiovascular system: S1 & S2 heard, RRR. No JVD. Gastrointestinal system: Abdomen is nondistended, soft and nontender.  Extremities: No pedal edema. Skin: No rashes, lesions or ulcers Psychiatry: Judgement and insight appear normal. Mood & affect appropriate.  Data Reviewed:    Labs: Basic Metabolic Panel: Recent Labs  Lab 03/10/20 1048 03/10/20 1323 03/11/20 0335 03/11/20 0335 03/11/20 1517 03/11/20 1517 03/12/20 0300 03/12/20 0301 03/12/20 0301 03/13/20 0214 03/13/20 0214 03/14/20 0415 03/15/20 0500  NA   < >  --  140   < > 135  --   --  138  --  135  --  140 138  K   < >  --  3.8   < > 3.8   < >  --  4.2   < > 4.0   < > 3.9 3.9   CL   < >  --  105   < > 102  --   --  102  --  100  --  103 100  CO2   < >  --  21*   < > 21*  --   --  22  --  23  --  25 25  GLUCOSE   < >  --  111*   < > 320*  --   --  271*  --  252*  --  213* 326*  BUN   < >  --  36*   < >  25*  --   --  24*  --  32*  --  49* 38*  CREATININE   < >  --  2.61*   < > 1.96*  --   --  1.83*  --  2.21*  --  3.18* 2.99*  CALCIUM   < >  --  8.3*   < > 8.3*  --   --  8.6*  --  8.7*  --  8.9 8.6*  MG  --  2.3 2.1  --   --   --  2.5*  --   --   --   --   --   --   PHOS  --   --  3.2   < > 2.9  --   --  2.4*  --  2.3*  --  3.7 3.7   < > = values in this interval not displayed.   GFR Estimated Creatinine Clearance: 18.5 mL/min (A) (by C-G formula based on SCr of 2.99 mg/dL (H)). Liver Function Tests: Recent Labs  Lab 03/10/20 1112 03/11/20 0335 03/12/20 0301 03/13/20 0214 03/14/20 0415 03/14/20 1812 03/15/20 0500  AST 432*  --   --  1,563*  --  366* 181*  ALT 330*  --   --  2,005*  --  1,393* 1,118*  ALKPHOS 99  --   --  140*  --  145* 129*  BILITOT 1.5*  --   --  1.4*  --  1.6* 1.2  PROT 5.0*  --   --  5.2*  --  4.9* 4.8*  ALBUMIN 3.1*   < > 3.0* 3.0*  3.0* 2.8* 2.8* 2.6*   < > = values in this interval not displayed.   No results for input(s): LIPASE, AMYLASE in the last 168 hours. Recent Labs  Lab 03/10/20 1630 03/13/20 0352  AMMONIA 74* 50*   Coagulation profile Recent Labs  Lab 03/10/20 1112 03/11/20 0335 03/12/20 1058 03/13/20 0857 03/14/20 0415  INR 2.1* 2.3* 2.9* 2.0* 1.7*   COVID-19 Labs  No results for input(s): DDIMER, FERRITIN, LDH, CRP in the last 72 hours.  Lab Results  Component Value Date   SARSCOV2NAA NEGATIVE 03/10/2020   [place] NEGATIVE 03/07/2020   [place] NEGATIVE 02/20/2020   [place] NEGATIVE 01/23/2020    CBC: Recent Labs  Lab 03/12/20 0300 03/12/20 1521 03/13/20 0214 03/14/20 0415 03/15/20 0500  WBC 15.3* 11.0* 8.6 9.9 6.5  HGB 10.4* 8.5* 8.8* 9.1* 8.6*  HCT 32.5* 26.7* 27.7* 28.3*  27.1*  MCV 114.0* 112.2* 113.5* 114.6* 112.9*  PLT 80* 48* 46* 51* 59*   Cardiac Enzymes: No results for input(s): CKTOTAL, CKMB, CKMBINDEX, TROPONINI in the last 168 hours. BNP (last 3 results) No results for input(s): PROBNP in the last 8760 hours. CBG: Recent Labs  Lab 03/14/20 0612 03/14/20 1259 03/14/20 1601 03/14/20 2100 03/15/20 0624  GLUCAP 191* 136* 260* 243* 287*   D-Dimer: No results for input(s): DDIMER in the last 72 hours. Hgb A1c: No results for input(s): HGBA1C in the last 72 hours. Lipid Profile: No results for input(s): CHOL, HDL, LDLCALC, TRIG, CHOLHDL, LDLDIRECT in the last 72 hours. Thyroid function studies: No results for input(s): TSH, T4TOTAL, T3FREE, THYROIDAB in the last 72 hours.  Invalid input(s): FREET3 Anemia work up: No results for input(s): VITAMINB12, FOLATE, FERRITIN, TIBC, IRON, RETICCTPCT in the last 72 hours. Sepsis Labs: Recent Labs  Lab 03/10/20 1032 03/10/20 1323 03/11/20 0335 03/11/20 0335 03/12/20 0300 03/12/20 0300 03/12/20 1521 03/13/20 0214 03/13/20  7209 03/13/20 0625 03/14/20 0415 03/14/20 1812 03/15/20 0500  PROCALCITON  --  18.11 8.99  --  6.06  --   --   --  7.43  --   --   --   --   WBC   < >  --  20.9*   < > 15.3*   < > 11.0* 8.6  --   --  9.9  --  6.5  LATICACIDVEN  --  2.7*  --   --   --   --   --   --  2.7* 3.4*  --  2.2*  --    < > = values in this interval not displayed.   Microbiology Recent Results (from the past 240 hour(s))  SARS Coronavirus 2 by RT PCR (hospital order, performed in [facility] hospital lab) Nasopharyngeal Nasopharyngeal Swab     Status: None   Collection Time: 03/07/20  1:13 AM   Specimen: Nasopharyngeal Swab  Result Value Ref Range Status   SARS Coronavirus 2 NEGATIVE NEGATIVE Final    Comment: (NOTE) SARS-CoV-2 target nucleic acids are NOT DETECTED.  The SARS-CoV-2 RNA is generally detectable in upper and lower respiratory specimens during the acute phase of infection. The  lowest concentration of SARS-CoV-2 viral copies this assay can detect is 250 copies / mL. A negative result does not preclude SARS-CoV-2 infection and should not be used as the sole basis for treatment or other patient management decisions.  A negative result may occur with improper specimen collection / handling, submission of specimen other than nasopharyngeal swab, presence of viral mutation(s) within the areas targeted by this assay, and inadequate number of viral copies (<250 copies / mL). A negative result must be combined with clinical observations, patient history, and epidemiological information.  Fact Sheet for Patients:   StrictlyIdeas.no  Fact Sheet for Healthcare Providers: BankingDealers.co.za  This test is not yet approved or  cleared by the [place] FDA and has been authorized for detection and/or diagnosis of SARS-CoV-2 by FDA under an Emergency Use Authorization (EUA).  This EUA will remain in effect (meaning this test can be used) for the duration of the COVID-19 declaration under Section 564(b)(1) of the Act, 21 U.S.C. section 360bbb-3(b)(1), unless the authorization is terminated or revoked sooner.  Performed at [facility], [address] [place], [place] [address]   SARS Coronavirus 2 by RT PCR (hospital order, performed in [facility] hospital lab) Nasopharyngeal Nasopharyngeal Swab     Status: None   Collection Time: 03/10/20 10:32 AM   Specimen: Nasopharyngeal Swab  Result Value Ref Range Status   SARS Coronavirus 2 NEGATIVE NEGATIVE Final    Comment: (NOTE) SARS-CoV-2 target nucleic acids are NOT DETECTED.  The SARS-CoV-2 RNA is generally detectable in upper and lower respiratory specimens during the acute phase of infection. The lowest concentration of SARS-CoV-2 viral copies this assay can detect is 250 copies / mL. A negative result does not preclude SARS-CoV-2 infection and should not be used as  the sole basis for treatment or other patient management decisions.  A negative result may occur with improper specimen collection / handling, submission of specimen other than nasopharyngeal swab, presence of viral mutation(s) within the areas targeted by this assay, and inadequate number of viral copies (<250 copies / mL). A negative result must be combined with clinical observations, patient history, and epidemiological information.  Fact Sheet for Patients:   StrictlyIdeas.no  Fact Sheet for Healthcare Providers: BankingDealers.co.za  This test is not  yet approved or  cleared by the [place] and has been authorized for detection and/or diagnosis of SARS-CoV-2 by FDA under an Emergency Use Authorization (EUA).  This EUA will remain in effect (meaning this test can be used) for the duration of the COVID-19 declaration under Section 564(b)(1) of the Act, 21 U.S.C. section 360bbb-3(b)(1), unless the authorization is terminated or revoked sooner.  Performed at [facility], [place] [address], [place], [place] [address]   Blood Culture (routine x 2)     Status: Abnormal   Collection Time: 03/10/20 11:12 AM   Specimen: BLOOD  Result Value Ref Range Status   Specimen Description BLOOD LEFT ANTECUBITAL  Final   Special Requests   Final    BOTTLES DRAWN AEROBIC AND ANAEROBIC Blood Culture adequate volume   Culture  Setup Time   Final    GRAM POSITIVE COCCI IN CHAINS IN BOTH AEROBIC AND ANAEROBIC BOTTLES Organism ID to follow CRITICAL RESULT CALLED TO, READ BACK BY AND VERIFIED WITH: PHARMD VERANDA BRICK AT [place] ON 03/11/2020 Performed at [facility], [place] [address] [place], [place] [address]    Culture ENTEROCOCCUS FAECALIS (A)  Final   Report Status 03/12/2020 FINAL  Final   Organism ID, Bacteria ENTEROCOCCUS FAECALIS  Final      Susceptibility   Enterococcus faecalis - MIC*    AMPICILLIN  <=2 SENSITIVE Sensitive     VANCOMYCIN 1 SENSITIVE Sensitive     GENTAMICIN SYNERGY SENSITIVE Sensitive     * ENTEROCOCCUS FAECALIS  Blood Culture ID Panel (Reflexed)     Status: Abnormal   Collection Time: 03/10/20 11:12 AM  Result Value Ref Range Status   Enterococcus species DETECTED (A) NOT DETECTED Final    Comment: CRITICAL RESULT CALLED TO, READ BACK BY AND VERIFIED WITH: PHARMD VERANDA BRICK AT 0052 BY MESSAN HOUEGNIFIO ON 03/11/2020    Vancomycin resistance NOT DETECTED NOT DETECTED Final   Listeria monocytogenes NOT DETECTED NOT DETECTED Final   Staphylococcus species NOT DETECTED NOT DETECTED Final   Staphylococcus aureus (BCID) NOT DETECTED NOT DETECTED Final   Streptococcus species NOT DETECTED NOT DETECTED Final   Streptococcus agalactiae NOT DETECTED NOT DETECTED Final   Streptococcus pneumoniae NOT DETECTED NOT DETECTED Final   Streptococcus pyogenes NOT DETECTED NOT DETECTED Final   Acinetobacter baumannii NOT DETECTED NOT DETECTED Final   Enterobacteriaceae species NOT DETECTED NOT DETECTED Final   Enterobacter cloacae complex NOT DETECTED NOT DETECTED Final   Escherichia coli NOT DETECTED NOT DETECTED Final   Klebsiella oxytoca NOT DETECTED NOT DETECTED Final   Klebsiella pneumoniae NOT DETECTED NOT DETECTED Final   Proteus species NOT DETECTED NOT DETECTED Final   Serratia marcescens NOT DETECTED NOT DETECTED Final   Haemophilus influenzae NOT DETECTED NOT DETECTED Final   Neisseria meningitidis NOT DETECTED NOT DETECTED Final   Pseudomonas aeruginosa NOT DETECTED NOT DETECTED Final   Candida albicans NOT DETECTED NOT DETECTED Final   Candida glabrata NOT DETECTED NOT DETECTED Final   Candida krusei NOT DETECTED NOT DETECTED Final   Candida parapsilosis NOT DETECTED NOT DETECTED Final   Candida tropicalis NOT DETECTED NOT DETECTED Final    Comment: Performed at [facility], [place]. [address], [place], [place] [address]  Blood Culture (routine x 2)      Status: Abnormal   Collection Time: 03/10/20 11:35 AM   Specimen: BLOOD LEFT FOREARM  Result Value Ref Range Status   Specimen Description BLOOD LEFT  FOREARM  Final   Special Requests   Final    BOTTLES DRAWN AEROBIC AND ANAEROBIC Blood Culture adequate volume   Culture  Setup Time   Final    GRAM POSITIVE COCCI IN CHAINS IN BOTH AEROBIC AND ANAEROBIC BOTTLES CRITICAL VALUE NOTED.  VALUE IS CONSISTENT WITH PREVIOUSLY REPORTED AND CALLED VALUE.    Culture (A)  Final    ENTEROCOCCUS FAECALIS SUSCEPTIBILITIES PERFORMED ON PREVIOUS CULTURE WITHIN THE LAST 5 DAYS. Performed at [facility], [place] [address], [place], [place] [address]    Report Status 03/12/2020 FINAL  Final  MRSA PCR Screening     Status: Abnormal   Collection Time: 03/10/20  6:08 PM   Specimen: Nasopharyngeal  Result Value Ref Range Status   MRSA by PCR (A) NEGATIVE Final    INVALID, UNABLE TO DETERMINE THE PRESENCE OF TARGET DUE TO SPECIMEN INTEGRITY. RECOLLECTION REQUESTED.    Comment: A,KOTEY @2145  03/10/20 EB Performed at [place] [address] [place], [place] [address]   MRSA PCR Screening     Status: None   Collection Time: 03/11/20  8:17 AM   Specimen: Nasal Mucosa; Nasopharyngeal  Result Value Ref Range Status   MRSA by PCR NEGATIVE NEGATIVE Final    Comment:        The GeneXpert MRSA Assay (FDA approved for NASAL specimens only), is one component of a comprehensive MRSA colonization surveillance program. It is not intended to diagnose MRSA infection nor to guide or monitor treatment for MRSA infections. Performed at [facility], [place] [address] [place], [place] [address]   Urine culture     Status: Abnormal   Collection Time: 03/11/20  9:17 AM   Specimen: In/Out Cath Urine  Result Value Ref Range Status   Specimen Description IN/OUT CATH URINE  Final   Special Requests   Final    NONE Performed at [facility], [place] [address], [place], [place] [address]     Culture MULTIPLE SPECIES PRESENT, SUGGEST RECOLLECTION (A)  Final   Report Status 03/12/2020 FINAL  Final  Culture, blood (routine x 2)     Status: None (Preliminary result)   Collection Time: 03/12/20 12:13 PM   Specimen: BLOOD LEFT HAND  Result Value Ref Range Status   Specimen Description BLOOD LEFT HAND  Final   Special Requests   Final    BOTTLES DRAWN AEROBIC ONLY Blood Culture results may not be optimal due to an inadequate volume of blood received in culture bottles   Culture   Final    NO GROWTH 3 DAYS Performed at [facility], [place] [address], [place], [place] [address]    Report Status PENDING  Incomplete  Culture, blood (routine x 2)     Status: None (Preliminary result)   Collection Time: 03/12/20  7:17 PM   Specimen: BLOOD LEFT HAND  Result Value Ref Range Status   Specimen Description BLOOD LEFT HAND  Final   Special Requests   Final    BOTTLES DRAWN AEROBIC ONLY Blood Culture results may not be optimal due to an inadequate volume of blood received in culture bottles   Culture   Final    NO GROWTH 3 DAYS Performed at [facility], [place] [address] [place],  [address]    Report Status PENDING  Incomplete  Culture, blood (Routine X 2) w Reflex to ID Panel     Status: None (Preliminary result)   Collection Time:  03/14/20  5:59 PM   Specimen: BLOOD LEFT HAND  Result Value Ref Range Status   Specimen Description BLOOD LEFT HAND  Final   Special Requests   Final    BOTTLES DRAWN AEROBIC AND ANAEROBIC Blood Culture adequate volume   Culture   Final    NO GROWTH < 24 HOURS Performed at [facility], [place] [address], [place], [place] [address]    Report Status PENDING  Incomplete  Culture, blood (Routine X 2) w Reflex to ID Panel     Status: None (Preliminary result)   Collection Time: 03/14/20 10:09 PM   Specimen: BLOOD  Result Value Ref Range Status   Specimen Description BLOOD LEFT HAND  Final   Special Requests   Final    BOTTLES DRAWN  AEROBIC ONLY Blood Culture adequate volume   Culture   Final    NO GROWTH < 12 HOURS Performed at [facility], [place] [address], [place], [place] [address]    Report Status PENDING  Incomplete     Medications:   . ARIPiprazole  2 mg Oral Daily  . aspirin EC  81 mg Oral Q breakfast  . buPROPion  150 mg Oral Daily  . calcitRIOL  0.25 mcg Oral Q M,W,F  . calcium acetate  2,001 mg Oral TID WC  . Chlorhexidine Gluconate Cloth  6 each Topical Daily  . diltiazem  30 mg Oral Q6H  . feeding supplement (ENSURE ENLIVE)  237 mL Oral BID BM  . insulin aspart  0-15 Units Subcutaneous TID WC  . insulin aspart  2 Units Subcutaneous TID WC  . insulin detemir  5 Units Subcutaneous Daily  . levETIRAcetam  250 mg Oral BID  . levothyroxine  50 mcg Oral QAC breakfast  . mouth rinse  15 mL Mouth Rinse BID  . metoprolol tartrate  25 mg Oral BID  . midodrine  2.5 mg Oral TID WC  . montelukast  10 mg Oral QHS  . multivitamin  1 tablet Oral QHS  . pantoprazole  40 mg Oral Daily  . predniSONE  10 mg Oral Q breakfast  . primidone  50 mg Oral Daily  . sodium chloride flush  10-40 mL Intracatheter Q12H  . Thrombi-Pad  1 each Topical Once  . traZODone  50 mg Oral QHS   Continuous Infusions: . sodium chloride    . amiodarone 30 mg/hr (03/15/20 0530)  . ampicillin (OMNIPEN) IV 2 g (03/15/20 0100)  . phenylephrine (NEO-SYNEPHRINE) Adult infusion Stopped (03/12/20 1435)      LOS: 5 days   Charlynne Cousins  [facility]  03/15/2020, 9:59 AM

## 2020-03-16 DIAGNOSIS — A4181 Sepsis due to Enterococcus: Secondary | ICD-10-CM | POA: Diagnosis not present

## 2020-03-16 DIAGNOSIS — I4819 Other persistent atrial fibrillation: Secondary | ICD-10-CM | POA: Diagnosis not present

## 2020-03-16 DIAGNOSIS — Z66 Do not resuscitate: Secondary | ICD-10-CM | POA: Diagnosis not present

## 2020-03-16 DIAGNOSIS — F202 Catatonic schizophrenia: Secondary | ICD-10-CM | POA: Diagnosis not present

## 2020-03-16 DIAGNOSIS — N390 Urinary tract infection, site not specified: Secondary | ICD-10-CM | POA: Diagnosis not present

## 2020-03-16 DIAGNOSIS — E1142 Type 2 diabetes mellitus with diabetic polyneuropathy: Secondary | ICD-10-CM

## 2020-03-16 DIAGNOSIS — D696 Thrombocytopenia, unspecified: Secondary | ICD-10-CM

## 2020-03-16 DIAGNOSIS — D62 Acute posthemorrhagic anemia: Secondary | ICD-10-CM | POA: Diagnosis not present

## 2020-03-16 DIAGNOSIS — Z515 Encounter for palliative care: Secondary | ICD-10-CM | POA: Diagnosis not present

## 2020-03-16 DIAGNOSIS — R7881 Bacteremia: Secondary | ICD-10-CM | POA: Diagnosis not present

## 2020-03-16 DIAGNOSIS — K7200 Acute and subacute hepatic failure without coma: Secondary | ICD-10-CM | POA: Diagnosis not present

## 2020-03-16 DIAGNOSIS — Z20822 Contact with and (suspected) exposure to covid-19: Secondary | ICD-10-CM | POA: Diagnosis not present

## 2020-03-16 DIAGNOSIS — I5033 Acute on chronic diastolic (congestive) heart failure: Secondary | ICD-10-CM | POA: Diagnosis not present

## 2020-03-16 DIAGNOSIS — G92 Toxic encephalopathy: Secondary | ICD-10-CM | POA: Diagnosis not present

## 2020-03-16 DIAGNOSIS — N186 End stage renal disease: Secondary | ICD-10-CM | POA: Diagnosis not present

## 2020-03-16 DIAGNOSIS — E662 Morbid (severe) obesity with alveolar hypoventilation: Secondary | ICD-10-CM | POA: Diagnosis not present

## 2020-03-16 DIAGNOSIS — I132 Hypertensive heart and chronic kidney disease with heart failure and with stage 5 chronic kidney disease, or end stage renal disease: Secondary | ICD-10-CM | POA: Diagnosis not present

## 2020-03-16 DIAGNOSIS — I4891 Unspecified atrial fibrillation: Secondary | ICD-10-CM | POA: Diagnosis not present

## 2020-03-16 DIAGNOSIS — I21A1 Myocardial infarction type 2: Secondary | ICD-10-CM | POA: Diagnosis not present

## 2020-03-16 DIAGNOSIS — T82838A Hemorrhage of vascular prosthetic devices, implants and grafts, initial encounter: Secondary | ICD-10-CM | POA: Diagnosis not present

## 2020-03-16 DIAGNOSIS — R6521 Severe sepsis with septic shock: Secondary | ICD-10-CM | POA: Diagnosis not present

## 2020-03-16 DIAGNOSIS — I472 Ventricular tachycardia: Secondary | ICD-10-CM | POA: Diagnosis not present

## 2020-03-16 DIAGNOSIS — E2740 Unspecified adrenocortical insufficiency: Secondary | ICD-10-CM | POA: Diagnosis not present

## 2020-03-16 DIAGNOSIS — J181 Lobar pneumonia, unspecified organism: Secondary | ICD-10-CM | POA: Diagnosis not present

## 2020-03-16 DIAGNOSIS — J9621 Acute and chronic respiratory failure with hypoxia: Secondary | ICD-10-CM | POA: Diagnosis not present

## 2020-03-16 DIAGNOSIS — I5043 Acute on chronic combined systolic (congestive) and diastolic (congestive) heart failure: Secondary | ICD-10-CM | POA: Diagnosis not present

## 2020-03-16 DIAGNOSIS — R0602 Shortness of breath: Secondary | ICD-10-CM | POA: Diagnosis present

## 2020-03-16 DIAGNOSIS — I4892 Unspecified atrial flutter: Secondary | ICD-10-CM | POA: Diagnosis not present

## 2020-03-16 DIAGNOSIS — N2581 Secondary hyperparathyroidism of renal origin: Secondary | ICD-10-CM | POA: Diagnosis not present

## 2020-03-16 DIAGNOSIS — Y718 Miscellaneous cardiovascular devices associated with adverse incidents, not elsewhere classified: Secondary | ICD-10-CM | POA: Diagnosis not present

## 2020-03-16 DIAGNOSIS — Y92239 Unspecified place in hospital as the place of occurrence of the external cause: Secondary | ICD-10-CM | POA: Diagnosis not present

## 2020-03-16 DIAGNOSIS — J440 Chronic obstructive pulmonary disease with acute lower respiratory infection: Secondary | ICD-10-CM | POA: Diagnosis not present

## 2020-03-16 LAB — RENAL FUNCTION PANEL
Albumin: 2.8 g/dL — ABNORMAL LOW (ref 3.5–5.0)
Anion gap: 13 (ref 5–15)
BUN: 62 mg/dL — ABNORMAL HIGH (ref 8–23)
CO2: 25 mmol/L (ref 22–32)
Calcium: 9 mg/dL (ref 8.9–10.3)
Chloride: 98 mmol/L (ref 98–111)
Creatinine, Ser: 4.06 mg/dL — ABNORMAL HIGH (ref 0.44–1.00)
GFR calc Af Amer: 12 mL/min — ABNORMAL LOW (ref >60)
GFR calc non Af Amer: 11 mL/min — ABNORMAL LOW (ref >60)
Glucose, Bld: 394 mg/dL — ABNORMAL HIGH (ref 70–99)
Phosphorus: 3.2 mg/dL (ref 2.5–4.6)
Potassium: 4 mmol/L (ref 3.5–5.1)
Sodium: 136 mmol/L (ref 135–145)

## 2020-03-16 LAB — CBC
HCT: 26.8 % — ABNORMAL LOW (ref 36.0–46.0)
Hemoglobin: 8.4 g/dL — ABNORMAL LOW (ref 12.0–15.0)
MCH: 35.3 pg — ABNORMAL HIGH (ref 26.0–34.0)
MCHC: 31.3 g/dL (ref 30.0–36.0)
MCV: 112.6 fL — ABNORMAL HIGH (ref 80.0–100.0)
Platelets: 74 10*3/uL — ABNORMAL LOW (ref 150–400)
RBC: 2.38 MIL/uL — ABNORMAL LOW (ref 3.87–5.11)
RDW: 15.9 % — ABNORMAL HIGH (ref 11.5–15.5)
WBC: 10 10*3/uL (ref 4.0–10.5)
nRBC: 0.9 % — ABNORMAL HIGH (ref 0.0–0.2)

## 2020-03-16 LAB — GLUCOSE, CAPILLARY
Glucose-Capillary: 401 mg/dL — ABNORMAL HIGH (ref 70–99)
Glucose-Capillary: 352 mg/dL — ABNORMAL HIGH (ref 70–99)
Glucose-Capillary: 411 mg/dL — ABNORMAL HIGH (ref 70–99)
Glucose-Capillary: 298 mg/dL — ABNORMAL HIGH (ref 70–99)

## 2020-03-16 LAB — COOXEMETRY PANEL
Carboxyhemoglobin: 1.7 % — ABNORMAL HIGH (ref 0.5–1.5)
Methemoglobin: 1.4 % (ref 0.0–1.5)
O2 Saturation: 83.2 %
Total hemoglobin: 9.2 g/dL — ABNORMAL LOW (ref 12.0–16.0)

## 2020-03-16 MED ORDER — INSULIN DETEMIR 100 UNIT/ML ~~LOC~~ SOLN
10.0000 [IU] | Freq: Two times a day (BID) | SUBCUTANEOUS | Status: DC
  Administered 2020-03-16: 10 [IU] via SUBCUTANEOUS
  Filled 2020-03-16 (×2): qty 0.1

## 2020-03-16 MED ORDER — INSULIN DETEMIR 100 UNIT/ML ~~LOC~~ SOLN
20.0000 [IU] | Freq: Two times a day (BID) | SUBCUTANEOUS | Status: DC
  Administered 2020-03-16: 20 [IU] via SUBCUTANEOUS
  Filled 2020-03-16 (×3): qty 0.2

## 2020-03-16 MED ORDER — INSULIN ASPART 100 UNIT/ML ~~LOC~~ SOLN
10.0000 [IU] | Freq: Once | SUBCUTANEOUS | Status: AC
  Administered 2020-03-16: 10 [IU] via SUBCUTANEOUS

## 2020-03-16 MED ORDER — MIDODRINE HCL 5 MG PO TABS
5.0000 mg | ORAL_TABLET | Freq: Three times a day (TID) | ORAL | Status: DC
  Administered 2020-03-16 – 2020-03-26 (×29): 5 mg via ORAL
  Filled 2020-03-16 (×31): qty 1

## 2020-03-16 MED ORDER — INSULIN ASPART 100 UNIT/ML ~~LOC~~ SOLN
20.0000 [IU] | Freq: Once | SUBCUTANEOUS | Status: AC
  Administered 2020-03-16: 20 [IU] via SUBCUTANEOUS

## 2020-03-16 NOTE — Progress Notes (Signed)
Progress Note  Patient Name: [name] Date of Encounter: 03/16/2020  [facility] Cardiologist: Rozann Lesches, MD   Subjective   No complaints  Inpatient Medications    Scheduled Meds: . ARIPiprazole  2 mg Oral Daily  . aspirin EC  81 mg Oral Q breakfast  . buPROPion  150 mg Oral Daily  . calcitRIOL  0.25 mcg Oral Q M,W,F  . calcium acetate  2,001 mg Oral TID WC  . Chlorhexidine Gluconate Cloth  6 each Topical Daily  . diltiazem  60 mg Oral Q6H  . feeding supplement (ENSURE ENLIVE)  237 mL Oral BID BM  . insulin aspart  0-5 Units Subcutaneous QHS  . insulin aspart  0-6 Units Subcutaneous TID WC  . insulin aspart  2 Units Subcutaneous TID WC  . insulin detemir  10 Units Subcutaneous Daily  . levETIRAcetam  250 mg Oral BID  . levothyroxine  50 mcg Oral QAC breakfast  . mouth rinse  15 mL Mouth Rinse BID  . metoprolol tartrate  50 mg Oral BID  . midodrine  2.5 mg Oral TID WC  . montelukast  10 mg Oral QHS  . multivitamin  1 tablet Oral QHS  . pantoprazole  40 mg Oral Daily  . predniSONE  10 mg Oral Q breakfast  . primidone  50 mg Oral Daily  . sodium chloride flush  10-40 mL Intracatheter Q12H  . Thrombi-Pad  1 each Topical Once  . traZODone  50 mg Oral QHS   Continuous Infusions: . sodium chloride    . amiodarone 30 mg/hr (03/16/20 0432)  . ampicillin (OMNIPEN) IV 2 g (03/16/20 0215)  . phenylephrine (NEO-SYNEPHRINE) Adult infusion Stopped (03/12/20 1435)   PRN Meds: acetaminophen, albuterol, calcium carbonate, diphenhydrAMINE, docusate sodium, EPINEPHrine, heparin, metoprolol tartrate, nitroGLYCERIN, ondansetron (ZOFRAN) IV, Resource ThickenUp Clear, sodium chloride flush   Vital Signs    Vitals:   03/15/20 1709 03/15/20 1943 03/16/20 0000 03/16/20 0432  BP:  (!) 138/85 125/70 113/84  Pulse:  (!) 114 (!) 110 (!) 111  Resp:  22 (!) 24 20  Temp: 98.4 F (36.9 C) 98.6 F (37 C) 98.3 F (36.8 C) 98.4 F (36.9 C)  TempSrc: Oral Oral  Oral  SpO2:   99% 100% 98%  Weight:   88 kg   Height:        Intake/Output Summary (Last 24 hours) at 03/16/2020 0806 Last data filed at 03/16/2020 0434 Gross per 24 hour  Intake 1094.9 ml  Output 50 ml  Net 1044.9 ml   Last 3 Weights 03/16/2020 03/15/2020 03/14/2020  Weight (lbs) 194 lb 0.1 oz 193 lb 2 oz 190 lb 14.7 oz  Weight (kg) 88 kg 87.6 kg 86.6 kg      Telemetry    afib 110s-120s - Personally Reviewed  ECG    n/a - Personally Reviewed  Physical Exam   GEN: No acute distress.   Neck: No JVD Cardiac: irreg, tachy, no murmurs, rubs, or gallops.  Respiratory: Clear to auscultation bilaterally. GI: Soft, nontender, non-distended  MS: No edema; No deformity. Neuro:  Nonfocal  Psych: Normal affect   Labs    High Sensitivity Troponin:   Recent Labs  Lab 03/10/20 1032 03/10/20 1323  TROPONINIHS 42* 56*      Chemistry Recent Labs  Lab 03/13/20 0214 03/13/20 0214 03/14/20 0415 03/14/20 0415 03/14/20 1812 03/15/20 0500 03/16/20 0535  NA 135   < > 140  --   --  138 136  K 4.0   < > 3.9  --   --  3.9 4.0  CL 100   < > 103  --   --  100 98  CO2 23   < > 25  --   --  25 25  GLUCOSE 252*   < > 213*  --   --  326* 394*  BUN 32*   < > 49*  --   --  38* 62*  CREATININE 2.21*   < > 3.18*  --   --  2.99* 4.06*  CALCIUM 8.7*   < > 8.9  --   --  8.6* 9.0  PROT 5.2*  --   --   --  4.9* 4.8*  --   ALBUMIN 3.0*  3.0*   < > 2.8*   < > 2.8* 2.6* 2.8*  AST 1,563*  --   --   --  366* 181*  --   ALT 2,005*  --   --   --  1,393* 1,118*  --   ALKPHOS 140*  --   --   --  145* 129*  --   BILITOT 1.4*  --   --   --  1.6* 1.2  --   GFRNONAA 22*   < > 14*  --   --  15* 11*  GFRAA 26*   < > 17*  --   --  18* 12*  ANIONGAP 12   < > 12  --   --  13 13   < > = values in this interval not displayed.     Hematology Recent Labs  Lab 03/14/20 0415 03/15/20 0500 03/16/20 0535  WBC 9.9 6.5 10.0  RBC 2.47* 2.40* 2.38*  HGB 9.1* 8.6* 8.4*  HCT 28.3* 27.1* 26.8*  MCV 114.6* 112.9* 112.6*  MCH  36.8* 35.8* 35.3*  MCHC 32.2 31.7 31.3  RDW 16.1* 16.0* 15.9*  PLT 51* 59* 74*    BNP Recent Labs  Lab 03/10/20 1032  BNP 1,486.7*     DDimer No results for input(s): DDIMER in the last 168 hours.   Radiology    No results found.  Cardiac Studies     Patient Profile     69 y.o.femalewith a hx of chronic diastolic HF, persistent atrial fibrillation/flutter on anticoagulation with Eliquis, anemia, mild to moderate aortic stenosis, ESRD on HD, HTN, HLD, bifascicular block(known RBBB+LAFB), COPD, DM 2, history of PE, seizure disorder, schizophrenia, Barrett's esophagus, memory loss and OSAwho was seen yesterday in the emergency room for A. fib with RVR and hypotension sec related to septic shock  Assessment & Plan    1. Persistent afib with RVR - has been on amio gtt - rate control has been difficult due to soft bp's particularly with HD - anticoag stopped due to low platelets and bleeding at dialysis site  -seen by EP, recs for continued amio drip and short acting dilt. Limited options given her ESRD, current sepsis, low platelets/bleeding and inability to anticoagulate. Consider TEE/DCCV when able to anticoagulate  -currently on amio gtt. Dilt increased to 60mg  every 6 hours and lopressor 50mg  bid yesterday  BP's look good at baseline but her issue is severe low bp's with HD. Started on midodrine this admission by primary team. May allow room to tirate av nodal agents if needed further. With recent increase in CCB and BB would increase midodrine to 5mg  tid. Will see how she tolerates her next HD -tachy drive should decrease also as sepsis  improves   2. ESRD - per nephrology   3. Septic shock due to enterococcus faecalis  4. Hypotension - started on midodrine  6. Transaminitis - LFTs trending down, likely related to sepsis and hypotension  For questions or updates, please contact [place] Please consult [email or web address] for contact info under         Signed, Carlyle Dolly, MD  03/16/2020, 8:06 AM

## 2020-03-16 NOTE — Progress Notes (Signed)
Admit: 03/10/2020 LOS: 6  89F ESRD with septic shock, AFib/RVR, hyperkalemia, enterococcal bacteremia  Subjective:  . NAE . No c/o  07/31 0701 - 08/01 0700 In: 1094.9 [P.O.:680; I.V.:414.9] Out: 50 [Urine:50]  Filed Weights   03/14/20 1305 03/15/20 0400 03/16/20 0000  Weight: 86.6 kg 87.6 kg 88 kg    Scheduled Meds: . ARIPiprazole  2 mg Oral Daily  . aspirin EC  81 mg Oral Q breakfast  . buPROPion  150 mg Oral Daily  . calcitRIOL  0.25 mcg Oral Q M,W,F  . calcium acetate  2,001 mg Oral TID WC  . Chlorhexidine Gluconate Cloth  6 each Topical Daily  . diltiazem  60 mg Oral Q6H  . feeding supplement (ENSURE ENLIVE)  237 mL Oral BID BM  . insulin aspart  0-5 Units Subcutaneous QHS  . insulin aspart  0-6 Units Subcutaneous TID WC  . insulin aspart  2 Units Subcutaneous TID WC  . insulin detemir  10 Units Subcutaneous Daily  . levETIRAcetam  250 mg Oral BID  . levothyroxine  50 mcg Oral QAC breakfast  . mouth rinse  15 mL Mouth Rinse BID  . metoprolol tartrate  50 mg Oral BID  . midodrine  5 mg Oral TID WC  . montelukast  10 mg Oral QHS  . multivitamin  1 tablet Oral QHS  . pantoprazole  40 mg Oral Daily  . predniSONE  10 mg Oral Q breakfast  . primidone  50 mg Oral Daily  . sodium chloride flush  10-40 mL Intracatheter Q12H  . Thrombi-Pad  1 each Topical Once  . traZODone  50 mg Oral QHS   Continuous Infusions: . sodium chloride    . amiodarone 30 mg/hr (03/16/20 0432)  . ampicillin (OMNIPEN) IV 2 g (03/16/20 0215)  . phenylephrine (NEO-SYNEPHRINE) Adult infusion Stopped (03/12/20 1435)   PRN Meds:.acetaminophen, albuterol, calcium carbonate, diphenhydrAMINE, docusate sodium, EPINEPHrine, heparin, metoprolol tartrate, nitroGLYCERIN, ondansetron (ZOFRAN) IV, Resource ThickenUp Clear, sodium chloride flush  Current Labs: reviewed    Physical Exam:  Blood pressure (!) 117/89, pulse (!) 114, temperature 98.6 F (37 C), temperature source Oral, resp. rate 20, height 5'  2" (1.575 m), weight 88 kg, SpO2 100 %. Chronically ill appearing Tachycardic, irregular, no rub Coarse breath sounds bilaterally Right upper arm AV graft with bruit and thrill Soft, nontender 1-2+ LEE  [facility] Bath2/2.5, Dialyzerunknown, Heparindoes not appear to receive heparin with her treatment, only in the catheter. Accessusing TDC.   A 1. ESRD [facility] 1. CRRT 7/26 - 7/28 2. iHD is currently on MWF schedule: 4h, 2K, 2L UF, no heparin, using RUE AVG 16g 350/600 3. Tunneled HD catheter removed 7/29 2. Septic shock 2/2 enterococcal bacteremia, resolved shock; not VRE; RCID following, on ampicillin; needs TEE 3.  RUE AVG placed 7/7, mature and usable 4. AFib with RVR, amio , cardiology following 5. HfPEF 6. Hypoxic RF on [place] 7. AMS waxing and waning 8. DM2 9. RA on steroids 10. TCP 11. 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 Anemia, will need to resume ESA  P . Tentative next dialysis on 8/2 . Palliative following . Medication Issues; o Preferred narcotic agents for pain control are hydromorphone, fentanyl, and methadone. Morphine should not be used.  o Baclofen should be avoided o Avoid oral sodium phosphate and magnesium citrate based laxatives / bowel preps    Pearson Grippe MD 03/16/2020, 10:25  AM  Recent Labs  Lab 03/14/20 0415 03/15/20 0500 03/16/20 0535  NA 140 138 136  K 3.9 3.9 4.0  CL 103 100 98  CO2 25 25 25   GLUCOSE 213* 326* 394*  BUN 49* 38* 62*  CREATININE 3.18* 2.99* 4.06*  CALCIUM 8.9 8.6* 9.0  PHOS 3.7 3.7 3.2   Recent Labs  Lab 03/14/20 0415 03/15/20 0500 03/16/20 0535  WBC 9.9 6.5 10.0  HGB 9.1* 8.6* 8.4*  HCT 28.3* 27.1* 26.8*  MCV 114.6* 112.9* 112.6*  PLT 51* 59* 74*

## 2020-03-16 NOTE — Progress Notes (Signed)
[facility] PROGRESS NOTE    Progress Note  [name]  PYP:[id number] DOB: [birth year] DOA: 03/10/2020 PCP: Glenda Chroman, MD     Brief Narrative:   [name] is an 69 y.o. female past medical history of chronic respiratory failure with hypoxia secondary to underlying COPD on 3 L of oxygen, A. fib on Eliquis end-stage renal disease on hemodialysis chronic diastolic heart failure moderate AS, who came into the hospital for hyperglycemia and tachycardia was found in A. fib with RVR, decompensated systolic heart failure and septic shock who presented to the ED with atrial fibrillation and RVR with a heart rate of 170 was started on a diltiazem drip on arrival to the ED heart rate improved however she developed progressive hypotension and was transitioned to amiodarone due to hypotension PCCM was consulted and started on pressors for septic shock due to right lower lobe pneumonia. 03/10/2020 blood cultures grew Enterococcus faecalis Changed to IV linezolid started on 03/11/2020-7.29.2021 7.29.2021 ampicillin. IR successfully remove her tunneled hemodialysis catheter on 03/13/2020. 03/12/2019 one 2D echo that showed an EF of 45% with mildly reduced RV function Initiated on CRRT on 03/10/2020 Off pressors on 03/10/2020 Assessment/Plan:   Septic shock due to Enterococcus faecalis probably due to her HD catheter: ID was consulted he was sensitive to Vanco, then switch to Zyvox,Now switched to ampicillin. We will need a TEE to rule out vegetation. She will need a line holiday.   Central line discontinued on 03/16/2020. Repeat blood cultures on 03/12/2020 and 7.30.2021 are negative till date.  Bleeding subclavian line: She is transfused 1 unit of platelets and 2 units of fresh frozen plasma, Eliquis was held, INR has improved to 1.7 artificially due to fresh frozen plasma.  Platelet count is greater than 50,000 artificially after 1 unit of platelets. Continue to hold  anticoagulation. Now resolved.  Persistent atrial fibrillation with RVR: Probably driven by her infectious bacteremia, she is currently on amiodarone drip. Due to heart control heart rate EP was consulted recommended to continue amiodarone, metoprolol and she was started on diltiazem. Audiology is on board and appreciate assistance she is currently on diltiazem 60 metoprolol 50 and amiodarone drip.  Her heart rate has been ranging in the low 100s.  Acute on chronic respiratory failure with hypoxia acute decompensated diastolic heart failure: Her volume is being managed by nephrology.  Acute metabolic/toxic encephalopathy: Her ammonia is level currently on lactulose.  Neurology on board. Neurology recommended to continue home primidone and Keppra. Encephalopathy is slowly resolving. Multifactorial in the setting of Keppra infectious etiology.  End-stage renal disease: On hemodialysis Monday Tuesday and Thursday further management of her volume status per renal. For dialysis on 03/14/2020 per renal, we consulted IR to remove HD catheter on 03/13/2020  Transaminitis: Likely due to shock liver it is continued to worsen despite correcting hypotension. Right upper quadrant ultrasound showed no obstructive jaundice. Transaminases are improving.  Elevated ammonia level: Continue lactulose.  Insulin-dependent diabetes mellitus type 2: Blood glucose persistently elevated, increase her long-acting insulin continue sliding scale insulin.  We will give her 10 additional units of NovoLog.  Continue CBGs before meals and at bedtime.  Elevated troponins: Likely due to demand ischemia, further management per cardiology. Titrating metoprolol up to control heart rate.  Hypothyroidism: Continue Synthroid.  Rheumatoid arthritis: On chronic prednisone continue current regimen.  Gout, unspecified  Schizophrenia ([place])  Acute Thrombocytopenia ([place]): Unlikely to be HIT, likely due to infectious  etiology plus or minus uremia. Platelet is trending  up slowly.  Hyperkalemia Per renal.  Now improved after dialysis.  Goals of care: It would be a good idea to get hospice imperative care involved discussed goals of care.  Stage II ulcer present on admission: RN Pressure Injury Documentation: Pressure Injury 03/10/20 Heel Left Unstageable - Full thickness tissue loss in which the base of the injury is covered by slough (yellow, tan, gray, green or brown) and/or eschar (tan, brown or black) in the wound bed. 3 cm x 2 cm (Active)  03/10/20 1730  Location: Heel  Location Orientation: Left  Staging: Unstageable - Full thickness tissue loss in which the base of the injury is covered by slough (yellow, tan, gray, green or brown) and/or eschar (tan, brown or black) in the wound bed.  Wound Description (Comments): 3 cm x 2 cm  Present on Admission: Yes    Estimated body mass index is 35.48 kg/m as calculated from the following:   Height as of this encounter: 5\' 2"  (1.575 m).   Weight as of this encounter: 88 kg. Malnutrition Type:  Nutrition Problem: Increased nutrient needs Etiology: acute illness   Malnutrition Characteristics:  Signs/Symptoms: estimated needs   Nutrition Interventions:  Interventions: Liberalize Diet, Magic cup, MVI, Ensure Enlive (each supplement provides 350kcal and 20 grams of protein)    DVT prophylaxis: none Family Communication:none Status is: Inpatient  Remains inpatient appropriate because:Hemodynamically unstable   Dispo: The patient is from: Home              Anticipated d/c is to: Home              Anticipated d/c date is: > 3 days              Patient currently is not medically stable to d/c.   Code Status:     Code Status Orders  (From admission, onward)         Start     Ordered   03/10/20 1229  Full code  Continuous        03/10/20 1229        Code Status History    Date Active Date Inactive Code Status Order ID Comments  User Context   01/23/2020 1928 01/26/2020 0003 Full Code [id number]  Vashti Hey, MD ED   11/22/2019 2141 11/27/2019 2110 Full Code [id number]  Bethena Roys, MD Inpatient   09/06/2019 0822 10/01/2019 2342 Full Code [id number]  Roxan Hockey, MD ED   11/28/2018 1635 11/29/2018 1904 Full Code [id number]  Murlean Iba, MD Inpatient   09/15/2018 1138 09/19/2018 1837 Full Code [id number]  Heath Lark D, DO ED   09/13/2018 0154 09/15/2018 1133 Full Code [id number]  Ripley Fraise, MD ED   08/16/2018 0603 08/20/2018 1453 Full Code [id number]  Aldean Jewett, MD Inpatient   07/27/2018 2032 08/01/2018 1538 Full Code [id number]  Arnell Asal, NP ED   07/14/2018 2209 07/15/2018 1634 Full Code [id number]  Vianne Bulls, MD ED   04/19/2018 0144 04/21/2018 1754 Full Code [id number]  Reubin Milan, MD ED   01/12/2018 1428 01/14/2018 0135 Full Code [id number]  Lorella Nimrod, MD ED   10/12/2016 0306 10/13/2016 1730 Full Code [id number]  Edwin Dada, MD Inpatient   08/12/2016 0128 08/13/2016 1809 Full Code [id number]  Lily Kocher, MD Inpatient   11/11/2015 0032 11/12/2015 1751 Full Code [id number]  Bethena Roys, MD Inpatient   Advance Care Planning Activity        IV Access:  Peripheral IV   Procedures and diagnostic studies:   No results found.   Medical Consultants:    None.  Anti-Infectives:   Ampicillin  Subjective:    [name] no new complaints today.  Objective:    Vitals:   03/15/20 1943 03/16/20 0000 03/16/20 0432 03/16/20 0800  BP: (!) 138/85 125/70 113/84 (!) 117/89  Pulse: (!) 114 (!) 110 (!) 111 (!) 114  Resp: 22 (!) 24 20 20   Temp: 98.6 F (37 C) 98.3 F (36.8 C) 98.4 F (36.9 C) 98.6 F (37 C)  TempSrc: Oral  Oral Oral  SpO2: 99% 100% 98% 100%  Weight:  88 kg    Height:       SpO2: 100 % O2 Flow Rate (L/min): 3 L/min FiO2 (%): 40 %   Intake/Output Summary (Last 24 hours) at 03/16/2020 1050 Last data filed at 03/16/2020  0434 Gross per 24 hour  Intake 854.9 ml  Output 50 ml  Net 804.9 ml   Filed Weights   03/14/20 1305 03/15/20 0400 03/16/20 0000  Weight: 86.6 kg 87.6 kg 88 kg    Exam: General exam: In no acute distress. Respiratory system: Good air movement and clear to auscultation. Cardiovascular system: S1 & S2 heard, RRR. No JVD. Gastrointestinal system: Abdomen is nondistended, soft and nontender.  Extremities: No pedal edema. Skin: No rashes, lesions or ulcers Psychiatry: Judgement and insight appear normal. Mood & affect appropriate.  Data Reviewed:    Labs: Basic Metabolic Panel: Recent Labs  Lab 03/10/20 1048 03/10/20 1323 03/11/20 0335 03/11/20 1517 03/12/20 0300 03/12/20 0301 03/12/20 0301 03/13/20 0214 03/13/20 0214 03/14/20 0415 03/14/20 0415 03/15/20 0500 03/16/20 0535  NA   < >  --  140   < >  --  138  --  135  --  140  --  138 136  K   < >  --  3.8   < >  --  4.2   < > 4.0   < > 3.9   < > 3.9 4.0  CL   < >  --  105   < >  --  102  --  100  --  103  --  100 98  CO2   < >  --  21*   < >  --  22  --  23  --  25  --  25 25  GLUCOSE   < >  --  111*   < >  --  271*  --  252*  --  213*  --  326* 394*  BUN   < >  --  36*   < >  --  24*  --  32*  --  49*  --  38* 62*  CREATININE   < >  --  2.61*   < >  --  1.83*  --  2.21*  --  3.18*  --  2.99* 4.06*  CALCIUM   < >  --  8.3*   < >  --  8.6*  --  8.7*  --  8.9  --  8.6* 9.0  MG  --  2.3 2.1  --  2.5*  --   --   --   --   --   --   --   --   PHOS  --   --  3.2   < >  --  2.4*  --  2.3*  --  3.7  --  3.7 3.2   < > = values in this interval not displayed.   GFR Estimated Creatinine Clearance: 13.7 mL/min (A) (by C-G formula based on SCr of 4.06 mg/dL (H)). Liver Function Tests: Recent Labs  Lab 03/10/20 1112 03/11/20 0335 03/13/20 0214 03/14/20 0415 03/14/20 1812 03/15/20 0500 03/16/20 0535  AST 432*  --  1,563*  --  366* 181*  --   ALT 330*  --  2,005*  --  1,393* 1,118*  --   ALKPHOS 99  --  140*  --  145* 129*   --   BILITOT 1.5*  --  1.4*  --  1.6* 1.2  --   PROT 5.0*  --  5.2*  --  4.9* 4.8*  --   ALBUMIN 3.1*   < > 3.0*  3.0* 2.8* 2.8* 2.6* 2.8*   < > = values in this interval not displayed.   No results for input(s): LIPASE, AMYLASE in the last 168 hours. Recent Labs  Lab 03/10/20 1630 03/13/20 0352  AMMONIA 74* 50*   Coagulation profile Recent Labs  Lab 03/10/20 1112 03/11/20 0335 03/12/20 1058 03/13/20 0857 03/14/20 0415  INR 2.1* 2.3* 2.9* 2.0* 1.7*   COVID-19 Labs  No results for input(s): DDIMER, FERRITIN, LDH, CRP in the last 72 hours.  Lab Results  Component Value Date   SARSCOV2NAA NEGATIVE 03/10/2020   [place] NEGATIVE 03/07/2020   [place] NEGATIVE 02/20/2020   [place] NEGATIVE 01/23/2020    CBC: Recent Labs  Lab 03/12/20 1521 03/13/20 0214 03/14/20 0415 03/15/20 0500 03/16/20 0535  WBC 11.0* 8.6 9.9 6.5 10.0  HGB 8.5* 8.8* 9.1* 8.6* 8.4*  HCT 26.7* 27.7* 28.3* 27.1* 26.8*  MCV 112.2* 113.5* 114.6* 112.9* 112.6*  PLT 48* 46* 51* 59* 74*   Cardiac Enzymes: No results for input(s): CKTOTAL, CKMB, CKMBINDEX, TROPONINI in the last 168 hours. BNP (last 3 results) No results for input(s): PROBNP in the last 8760 hours. CBG: Recent Labs  Lab 03/15/20 1228 03/15/20 1706 03/15/20 2110 03/15/20 2202 03/16/20 0559  GLUCAP 384* 455* 437* 382* 401*   D-Dimer: No results for input(s): DDIMER in the last 72 hours. Hgb A1c: No results for input(s): HGBA1C in the last 72 hours. Lipid Profile: No results for input(s): CHOL, HDL, LDLCALC, TRIG, CHOLHDL, LDLDIRECT in the last 72 hours. Thyroid function studies: No results for input(s): TSH, T4TOTAL, T3FREE, THYROIDAB in the last 72 hours.  Invalid input(s): FREET3 Anemia work up: No results for input(s): VITAMINB12, FOLATE, FERRITIN, TIBC, IRON, RETICCTPCT in the last 72 hours. Sepsis Labs: Recent Labs  Lab 03/10/20 1032 03/10/20 1323 03/11/20 0335 03/11/20 0335 03/12/20 0300  03/12/20 1521 03/13/20 0214 03/13/20 0351 03/13/20 0625 03/14/20 0415 03/14/20 1812 03/15/20 0500 03/16/20 0535  PROCALCITON  --  18.11 8.99  --  6.06  --   --  7.43  --   --   --   --   --   WBC   < >  --  20.9*   < > 15.3*   < > 8.6  --   --  9.9  --  6.5 10.0  LATICACIDVEN  --  2.7*  --   --   --   --   --  2.7* 3.4*  --  2.2*  --   --    < > = values in this interval not displayed.   Microbiology Recent Results (from the past 240 hour(s))  SARS Coronavirus 2 by RT PCR (hospital order, performed in [facility]  hospital lab) Nasopharyngeal Nasopharyngeal Swab     Status: None   Collection Time: 03/07/20  1:13 AM   Specimen: Nasopharyngeal Swab  Result Value Ref Range Status   SARS Coronavirus 2 NEGATIVE NEGATIVE Final    Comment: (NOTE) SARS-CoV-2 target nucleic acids are NOT DETECTED.  The SARS-CoV-2 RNA is generally detectable in upper and lower respiratory specimens during the acute phase of infection. The lowest concentration of SARS-CoV-2 viral copies this assay can detect is 250 copies / mL. A negative result does not preclude SARS-CoV-2 infection and should not be used as the sole basis for treatment or other patient management decisions.  A negative result may occur with improper specimen collection / handling, submission of specimen other than nasopharyngeal swab, presence of viral mutation(s) within the areas targeted by this assay, and inadequate number of viral copies (<250 copies / mL). A negative result must be combined with clinical observations, patient history, and epidemiological information.  Fact Sheet for Patients:   StrictlyIdeas.no  Fact Sheet for Healthcare Providers: BankingDealers.co.za  This test is not yet approved or  cleared by the [place] FDA and has been authorized for detection and/or diagnosis of SARS-CoV-2 by FDA under an Emergency Use Authorization (EUA).  This EUA will remain in  effect (meaning this test can be used) for the duration of the COVID-19 declaration under Section 564(b)(1) of the Act, 21 U.S.C. section 360bbb-3(b)(1), unless the authorization is terminated or revoked sooner.  Performed at [facility], [address] [place], [place] [address]   SARS Coronavirus 2 by RT PCR (hospital order, performed in [facility] hospital lab) Nasopharyngeal Nasopharyngeal Swab     Status: None   Collection Time: 03/10/20 10:32 AM   Specimen: Nasopharyngeal Swab  Result Value Ref Range Status   SARS Coronavirus 2 NEGATIVE NEGATIVE Final    Comment: (NOTE) SARS-CoV-2 target nucleic acids are NOT DETECTED.  The SARS-CoV-2 RNA is generally detectable in upper and lower respiratory specimens during the acute phase of infection. The lowest concentration of SARS-CoV-2 viral copies this assay can detect is 250 copies / mL. A negative result does not preclude SARS-CoV-2 infection and should not be used as the sole basis for treatment or other patient management decisions.  A negative result may occur with improper specimen collection / handling, submission of specimen other than nasopharyngeal swab, presence of viral mutation(s) within the areas targeted by this assay, and inadequate number of viral copies (<250 copies / mL). A negative result must be combined with clinical observations, patient history, and epidemiological information.  Fact Sheet for Patients:   StrictlyIdeas.no  Fact Sheet for Healthcare Providers: BankingDealers.co.za  This test is not yet approved or  cleared by the [place] FDA and has been authorized for detection and/or diagnosis of SARS-CoV-2 by FDA under an Emergency Use Authorization (EUA).  This EUA will remain in effect (meaning this test can be used) for the duration of the COVID-19 declaration under Section 564(b)(1) of the Act, 21 U.S.C. section 360bbb-3(b)(1), unless the  authorization is terminated or revoked sooner.  Performed at [facility], [place] [address], [place], [place] [address]   Blood Culture (routine x 2)     Status: Abnormal   Collection Time: 03/10/20 11:12 AM   Specimen: BLOOD  Result Value Ref Range Status   Specimen Description BLOOD LEFT ANTECUBITAL  Final   Special Requests   Final    BOTTLES DRAWN AEROBIC AND ANAEROBIC Blood Culture adequate volume   Culture  Setup Time   Final    GRAM POSITIVE COCCI IN CHAINS IN BOTH AEROBIC AND ANAEROBIC BOTTLES Organism ID to follow CRITICAL RESULT CALLED TO, READ BACK BY AND VERIFIED WITH: PHARMD VERANDA BRICK AT [place] ON 03/11/2020 Performed at [facility], [place] [address] [place], [place] [address]    Culture ENTEROCOCCUS FAECALIS (A)  Final   Report Status 03/12/2020 FINAL  Final   Organism ID, Bacteria ENTEROCOCCUS FAECALIS  Final      Susceptibility   Enterococcus faecalis - MIC*    AMPICILLIN <=2 SENSITIVE Sensitive     VANCOMYCIN 1 SENSITIVE Sensitive     GENTAMICIN SYNERGY SENSITIVE Sensitive     * ENTEROCOCCUS FAECALIS  Blood Culture ID Panel (Reflexed)     Status: Abnormal   Collection Time: 03/10/20 11:12 AM  Result Value Ref Range Status   Enterococcus species DETECTED (A) NOT DETECTED Final    Comment: CRITICAL RESULT CALLED TO, READ BACK BY AND VERIFIED WITH: PHARMD VERANDA BRICK AT 0052 BY MESSAN HOUEGNIFIO ON 03/11/2020    Vancomycin resistance NOT DETECTED NOT DETECTED Final   Listeria monocytogenes NOT DETECTED NOT DETECTED Final   Staphylococcus species NOT DETECTED NOT DETECTED Final   Staphylococcus aureus (BCID) NOT DETECTED NOT DETECTED Final   Streptococcus species NOT DETECTED NOT DETECTED Final   Streptococcus agalactiae NOT DETECTED NOT DETECTED Final   Streptococcus pneumoniae NOT DETECTED NOT DETECTED Final   Streptococcus pyogenes NOT DETECTED NOT DETECTED Final   Acinetobacter baumannii NOT DETECTED NOT DETECTED Final    Enterobacteriaceae species NOT DETECTED NOT DETECTED Final   Enterobacter cloacae complex NOT DETECTED NOT DETECTED Final   Escherichia coli NOT DETECTED NOT DETECTED Final   Klebsiella oxytoca NOT DETECTED NOT DETECTED Final   Klebsiella pneumoniae NOT DETECTED NOT DETECTED Final   Proteus species NOT DETECTED NOT DETECTED Final   Serratia marcescens NOT DETECTED NOT DETECTED Final   Haemophilus influenzae NOT DETECTED NOT DETECTED Final   Neisseria meningitidis NOT DETECTED NOT DETECTED Final   Pseudomonas aeruginosa NOT DETECTED NOT DETECTED Final   Candida albicans NOT DETECTED NOT DETECTED Final   Candida glabrata NOT DETECTED NOT DETECTED Final   Candida krusei NOT DETECTED NOT DETECTED Final   Candida parapsilosis NOT DETECTED NOT DETECTED Final   Candida tropicalis NOT DETECTED NOT DETECTED Final    Comment: Performed at [facility], [place]. [address] [place], [place] [address]  Blood Culture (routine x 2)     Status: Abnormal   Collection Time: 03/10/20 11:35 AM   Specimen: BLOOD LEFT FOREARM  Result Value Ref Range Status   Specimen Description BLOOD LEFT FOREARM  Final   Special Requests   Final    BOTTLES DRAWN AEROBIC AND ANAEROBIC Blood Culture adequate volume   Culture  Setup Time   Final    GRAM POSITIVE COCCI IN CHAINS IN BOTH AEROBIC AND ANAEROBIC BOTTLES CRITICAL VALUE NOTED.  VALUE IS CONSISTENT WITH PREVIOUSLY REPORTED AND CALLED VALUE.    Culture (A)  Final    ENTEROCOCCUS FAECALIS SUSCEPTIBILITIES PERFORMED ON PREVIOUS CULTURE WITHIN THE LAST 5 DAYS. Performed at [facility], [place] [address] [place], [place] [address]    Report Status 03/12/2020 FINAL  Final  MRSA PCR Screening     Status: Abnormal   Collection Time: 03/10/20  6:08 PM   Specimen: Nasopharyngeal  Result Value Ref Range Status   MRSA by PCR (A) NEGATIVE Final    INVALID, UNABLE  TO DETERMINE THE PRESENCE OF TARGET DUE TO SPECIMEN INTEGRITY. RECOLLECTION REQUESTED.    Comment:  A,KOTEY @2145  03/10/20 EB Performed at [facility] [address] [place], [place] [address]   MRSA PCR Screening     Status: None   Collection Time: 03/11/20  8:17 AM   Specimen: Nasal Mucosa; Nasopharyngeal  Result Value Ref Range Status   MRSA by PCR NEGATIVE NEGATIVE Final    Comment:        The GeneXpert MRSA Assay (FDA approved for NASAL specimens only), is one component of a comprehensive MRSA colonization surveillance program. It is not intended to diagnose MRSA infection nor to guide or monitor treatment for MRSA infections. Performed at [facility], [place] [address] [place], [place] [address]   Urine culture     Status: Abnormal   Collection Time: 03/11/20  9:17 AM   Specimen: In/Out Cath Urine  Result Value Ref Range Status   Specimen Description IN/OUT CATH URINE  Final   Special Requests   Final    NONE Performed at [facility], [place] [address], [place], [place] [address]    Culture MULTIPLE SPECIES PRESENT, SUGGEST RECOLLECTION (A)  Final   Report Status 03/12/2020 FINAL  Final  Culture, blood (routine x 2)     Status: None (Preliminary result)   Collection Time: 03/12/20 12:13 PM   Specimen: BLOOD LEFT HAND  Result Value Ref Range Status   Specimen Description BLOOD LEFT HAND  Final   Special Requests   Final    BOTTLES DRAWN AEROBIC ONLY Blood Culture results may not be optimal due to an inadequate volume of blood received in culture bottles   Culture   Final    NO GROWTH 4 DAYS Performed at [facility], [place] [address] [place], [place] [address]    Report Status PENDING  Incomplete  Culture, blood (routine x 2)     Status: None (Preliminary result)   Collection Time: 03/12/20  7:17 PM   Specimen: BLOOD LEFT HAND  Result Value Ref Range Status   Specimen Description BLOOD LEFT HAND  Final   Special Requests   Final    BOTTLES DRAWN AEROBIC ONLY Blood Culture results may not be optimal due to an inadequate volume of blood  received in culture bottles   Culture   Final    NO GROWTH 4 DAYS Performed at [facility], [place] [address], [place], [place] [address]    Report Status PENDING  Incomplete  Culture, blood (Routine X 2) w Reflex to ID Panel     Status: None (Preliminary result)   Collection Time: 03/14/20  5:59 PM   Specimen: BLOOD LEFT HAND  Result Value Ref Range Status   Specimen Description BLOOD LEFT HAND  Final   Special Requests   Final    BOTTLES DRAWN AEROBIC AND ANAEROBIC Blood Culture adequate volume   Culture   Final    NO GROWTH 2 DAYS Performed at [facility], [place] [address], [place], [place] [address]    Report Status PENDING  Incomplete  Culture, blood (Routine X 2) w Reflex to ID Panel     Status: None (Preliminary result)   Collection Time: 03/14/20 10:09 PM   Specimen: BLOOD  Result Value Ref Range Status   Specimen Description BLOOD LEFT HAND  Final   Special Requests   Final    BOTTLES DRAWN AEROBIC ONLY Blood Culture adequate volume  Culture   Final    NO GROWTH 2 DAYS Performed at [facility], [place] [address] [place], [place] [address]    Report Status PENDING  Incomplete     Medications:   . ARIPiprazole  2 mg Oral Daily  . aspirin EC  81 mg Oral Q breakfast  . buPROPion  150 mg Oral Daily  . calcitRIOL  0.25 mcg Oral Q M,W,F  . calcium acetate  2,001 mg Oral TID WC  . Chlorhexidine Gluconate Cloth  6 each Topical Daily  . diltiazem  60 mg Oral Q6H  . feeding supplement (ENSURE ENLIVE)  237 mL Oral BID BM  . insulin aspart  0-5 Units Subcutaneous QHS  . insulin aspart  0-6 Units Subcutaneous TID WC  . insulin aspart  2 Units Subcutaneous TID WC  . insulin detemir  10 Units Subcutaneous Daily  . levETIRAcetam  250 mg Oral BID  . levothyroxine  50 mcg Oral QAC breakfast  . mouth rinse  15 mL Mouth Rinse BID  . metoprolol tartrate  50 mg Oral BID  . midodrine  5 mg Oral TID WC  . montelukast  10 mg Oral QHS  . multivitamin  1 tablet Oral  QHS  . pantoprazole  40 mg Oral Daily  . predniSONE  10 mg Oral Q breakfast  . primidone  50 mg Oral Daily  . sodium chloride flush  10-40 mL Intracatheter Q12H  . Thrombi-Pad  1 each Topical Once  . traZODone  50 mg Oral QHS   Continuous Infusions: . sodium chloride    . amiodarone 30 mg/hr (03/16/20 0432)  . ampicillin (OMNIPEN) IV Stopped (03/16/20 1039)  . phenylephrine (NEO-SYNEPHRINE) Adult infusion Stopped (03/12/20 1435)      LOS: 6 days   [name]  [facility]  03/16/2020, 10:50 AM

## 2020-03-16 NOTE — Consult Note (Signed)
Palliative Medicine Inpatient Consult Note  Reason for consult:  Goals of Care "End of Life"  HPI:  Per intake [name] --> [name] is an 69 y.o. female past medical history of chronic respiratory failure with hypoxia secondary to underlying COPD on 3 L of oxygen, A. fib on Eliquis end-stage renal disease on hemodialysis chronic diastolic heart failure moderate AS, who came into the hospital for hyperglycemia and tachycardia was found in A. fib with RVR, decompensated systolic heart failure and septic shock who presented to the ED with atrial fibrillation and RVR with a heart rate of 170 was started on a diltiazem drip on arrival to the ED heart rate improved however she developed progressive hypotension and was transitioned to amiodarone due to hypotension PCCM was consulted and started on pressors for septic shock due to right lower lobe pneumonia.  Palliative care was asked to get involved to discuss goals of care and broach the topic of hospice.   Clinical Assessment/Goals of Care: I have reviewed medical records including EPIC notes, labs and imaging, received report from bedside RN, assessed the patient who was lying in bed pleasantly confused.    I met with  [name] to further discuss diagnosis prognosis, GOC, EOL wishes, disposition and options. Unfortunately, [name] was unable to appropriately participate in today's conversation due to cognitive limitations.    SOCIAL: A chart review has been completed. [name] has undergone multiple hospitalizations and ER visits in the past six months. She has a constellation of co-morbid conditions inclusive of COPD on 3 L of oxygen, A. fib on Eliquis, end-stage renal disease on hemodialysis, chronic diastolic heart failure, and moderate AS. She has never been seen by Palliative care and is not followed at [facility] where she reside as a long term resident.   [name] use to live with her niece, [name] who was enlisted as her legal  guardian via chart review though there is not paperwork in our charting to properly support this. As of 09/26/2019 it appears her [facility] social worker [name] stated [facility] does not have Guardianship of patient, and they have not filed for guardianship.   I have asked our SW team to investigate this more thoroughly. I do not feel that at this point in time it would be appropriate to have a GOC conversation as I do not think [name] can fully comprehend the measures being described.  If she should decompensate, I think it reasonable to consider enacting our Futility Policy for care moving forward given the absence of any legal representation.   Decision Maker: Patient has no formal Management consultant. She has a niece and prior had been enlisted to have a legal guardian through [facility].   SUMMARY OF RECOMMENDATIONS   Full Code  Social Work - Aid in finding out who patients legal guardian is --> as of most recently it appears that patients paperwork had never been formally filed with DSS.  If patient appears clinically unstable moving forward and there is an inability to contact an appropriate representative it would be reasonable to consider the futility policy for treatment  TOC - OP Palliative Care  Code Status/Advance Care Planning: FULL CODE   Palliative Prophylaxis:   Oral Care, Mobility  Additional Recommendations (Limitations, Scope, Preferences):  Continue Current scope of care   Psycho-social/Spiritual:   Desire for further Chaplaincy support: Yes - Christian  Additional Recommendations: Ongoing education on illness    Prognosis: Multiple chronic medical conditions -  4 IP Says and 4 ED admissions high risk for mortality in the next year  Discharge Planning: Patients lives at [facility] - will request OP Palliative care  PPS: 30%    This conversation/these recommendations were discussed with patient primary care team,  Dr. Olevia Bowens  Time In: 0900 Time Out: 0945 Total Time: 87 Greater than 50%  of this time was spent counseling and coordinating care related to the above assessment and plan.  [place] Team Team Cell Phone: [phone] Please utilize secure chat with additional questions, if there is no response within 30 minutes please call the above phone number  Palliative Medicine Team providers are available by phone from 7am to 7pm daily and can be reached through the team cell phone.  Should this patient require assistance outside of these hours, please call the patient's attending physician.

## 2020-03-16 NOTE — Progress Notes (Signed)
   03/15/20 1943  Assess: MEWS Score  Temp 98.6 F (37 C)  BP (!) 138/85  Pulse Rate (!) 114  ECG Heart Rate (!) 126  Resp 22  SpO2 99 %  Assess: MEWS Score  MEWS Temp 0  MEWS Systolic 0  MEWS Pulse 2  MEWS RR 1  MEWS LOC 0  MEWS Score 3  MEWS Score Color Yellow  Assess: if the MEWS score is Yellow or Red  Were vital signs taken at a resting state? Yes  Focused Assessment No change from prior assessment  Early Detection of Sepsis Score *See Row Information* Medium  MEWS guidelines implemented *See Row Information* No, previously yellow, continue vital signs every 4 hours  Treat  MEWS Interventions Administered scheduled meds/treatments  Document  Progress note created (see row info) Yes  monitored.

## 2020-03-16 NOTE — Progress Notes (Signed)
A consult was placed to IV Therapy to dc the patient's central line;  Pt is still on IV Amiodarone;  She is limited to the Left arm only for all labs and iv sites;  Left arm is noted to have severe bruising and skin tears.  Have spoken with Thurmond Butts, RN ,  And recommend that the central line be kept, as pt has very limited access, and it is recommended that Amiodarone infuse centrally;  Thurmond Butts has said he will speak with the MD;  Consult cancelled at this time.

## 2020-03-17 DIAGNOSIS — D539 Nutritional anemia, unspecified: Secondary | ICD-10-CM | POA: Diagnosis not present

## 2020-03-17 DIAGNOSIS — A4181 Sepsis due to Enterococcus: Secondary | ICD-10-CM | POA: Diagnosis not present

## 2020-03-17 DIAGNOSIS — I4891 Unspecified atrial fibrillation: Secondary | ICD-10-CM | POA: Diagnosis not present

## 2020-03-17 DIAGNOSIS — I5033 Acute on chronic diastolic (congestive) heart failure: Secondary | ICD-10-CM | POA: Diagnosis not present

## 2020-03-17 DIAGNOSIS — R0602 Shortness of breath: Secondary | ICD-10-CM | POA: Diagnosis not present

## 2020-03-17 DIAGNOSIS — R6521 Severe sepsis with septic shock: Secondary | ICD-10-CM | POA: Diagnosis not present

## 2020-03-17 DIAGNOSIS — R7881 Bacteremia: Secondary | ICD-10-CM | POA: Diagnosis not present

## 2020-03-17 DIAGNOSIS — J90 Pleural effusion, not elsewhere classified: Secondary | ICD-10-CM

## 2020-03-17 DIAGNOSIS — R748 Abnormal levels of other serum enzymes: Secondary | ICD-10-CM | POA: Diagnosis not present

## 2020-03-17 LAB — CULTURE, BLOOD (ROUTINE X 2)
Culture: NO GROWTH
Culture: NO GROWTH

## 2020-03-17 LAB — GLUCOSE, CAPILLARY
Glucose-Capillary: 125 mg/dL — ABNORMAL HIGH (ref 70–99)
Glucose-Capillary: 98 mg/dL (ref 70–99)
Glucose-Capillary: 268 mg/dL — ABNORMAL HIGH (ref 70–99)
Glucose-Capillary: 303 mg/dL — ABNORMAL HIGH (ref 70–99)

## 2020-03-17 LAB — RENAL FUNCTION PANEL
Albumin: 2.7 g/dL — ABNORMAL LOW (ref 3.5–5.0)
Anion gap: 13 (ref 5–15)
BUN: 73 mg/dL — ABNORMAL HIGH (ref 8–23)
CO2: 26 mmol/L (ref 22–32)
Calcium: 9.4 mg/dL (ref 8.9–10.3)
Chloride: 99 mmol/L (ref 98–111)
Creatinine, Ser: 4.78 mg/dL — ABNORMAL HIGH (ref 0.44–1.00)
GFR calc Af Amer: 10 mL/min — ABNORMAL LOW (ref >60)
GFR calc non Af Amer: 9 mL/min — ABNORMAL LOW (ref >60)
Glucose, Bld: 151 mg/dL — ABNORMAL HIGH (ref 70–99)
Phosphorus: 2.6 mg/dL (ref 2.5–4.6)
Potassium: 3.6 mmol/L (ref 3.5–5.1)
Sodium: 138 mmol/L (ref 135–145)

## 2020-03-17 LAB — CBC
HCT: 27.6 % — ABNORMAL LOW (ref 36.0–46.0)
Hemoglobin: 8.9 g/dL — ABNORMAL LOW (ref 12.0–15.0)
MCH: 36 pg — ABNORMAL HIGH (ref 26.0–34.0)
MCHC: 32.2 g/dL (ref 30.0–36.0)
MCV: 111.7 fL — ABNORMAL HIGH (ref 80.0–100.0)
Platelets: 89 10*3/uL — ABNORMAL LOW (ref 150–400)
RBC: 2.47 MIL/uL — ABNORMAL LOW (ref 3.87–5.11)
RDW: 15.9 % — ABNORMAL HIGH (ref 11.5–15.5)
WBC: 11 10*3/uL — ABNORMAL HIGH (ref 4.0–10.5)
nRBC: 0.5 % — ABNORMAL HIGH (ref 0.0–0.2)

## 2020-03-17 LAB — COOXEMETRY PANEL
Carboxyhemoglobin: 1.8 % — ABNORMAL HIGH (ref 0.5–1.5)
Methemoglobin: 1.2 % (ref 0.0–1.5)
O2 Saturation: 90.1 %
Total hemoglobin: 9.1 g/dL — ABNORMAL LOW (ref 12.0–16.0)

## 2020-03-17 MED ORDER — DILTIAZEM HCL 60 MG PO TABS
90.0000 mg | ORAL_TABLET | Freq: Four times a day (QID) | ORAL | Status: AC
  Administered 2020-03-17 – 2020-03-19 (×8): 90 mg via ORAL
  Filled 2020-03-17 (×8): qty 1

## 2020-03-17 MED ORDER — METOPROLOL TARTRATE 100 MG PO TABS
100.0000 mg | ORAL_TABLET | Freq: Two times a day (BID) | ORAL | Status: DC
  Administered 2020-03-17 – 2020-03-26 (×18): 100 mg via ORAL
  Filled 2020-03-17 (×18): qty 1

## 2020-03-17 MED ORDER — HYDROCODONE-ACETAMINOPHEN 5-325 MG PO TABS
ORAL_TABLET | ORAL | Status: AC
  Filled 2020-03-17: qty 1

## 2020-03-17 MED ORDER — AMIODARONE HCL 200 MG PO TABS
200.0000 mg | ORAL_TABLET | Freq: Two times a day (BID) | ORAL | Status: DC
  Administered 2020-03-17 – 2020-03-26 (×19): 200 mg via ORAL
  Filled 2020-03-17 (×20): qty 1

## 2020-03-17 MED ORDER — HYDROCODONE-ACETAMINOPHEN 5-325 MG PO TABS
1.0000 | ORAL_TABLET | ORAL | Status: DC | PRN
  Administered 2020-03-17 – 2020-03-18 (×3): 1 via ORAL
  Administered 2020-03-18: 2 via ORAL
  Administered 2020-03-18 – 2020-03-19 (×2): 1 via ORAL
  Administered 2020-03-19: 2 via ORAL
  Administered 2020-03-20 (×2): 1 via ORAL
  Administered 2020-03-21: 2 via ORAL
  Administered 2020-03-21: 1 via ORAL
  Administered 2020-03-21: 2 via ORAL
  Administered 2020-03-22: 1 via ORAL
  Administered 2020-03-22 – 2020-03-24 (×6): 2 via ORAL
  Administered 2020-03-25: 1 via ORAL
  Filled 2020-03-17: qty 1
  Filled 2020-03-17 (×2): qty 2
  Filled 2020-03-17: qty 1
  Filled 2020-03-17: qty 2
  Filled 2020-03-17: qty 1
  Filled 2020-03-17 (×2): qty 2
  Filled 2020-03-17 (×2): qty 1
  Filled 2020-03-17 (×3): qty 2
  Filled 2020-03-17: qty 1
  Filled 2020-03-17: qty 2
  Filled 2020-03-17: qty 1
  Filled 2020-03-17: qty 2
  Filled 2020-03-17 (×2): qty 1

## 2020-03-17 MED ORDER — CALCITRIOL 0.25 MCG PO CAPS
ORAL_CAPSULE | ORAL | Status: AC
  Filled 2020-03-17: qty 1

## 2020-03-17 MED ORDER — INSULIN DETEMIR 100 UNIT/ML ~~LOC~~ SOLN
30.0000 [IU] | Freq: Two times a day (BID) | SUBCUTANEOUS | Status: DC
  Administered 2020-03-17 – 2020-03-18 (×3): 30 [IU] via SUBCUTANEOUS
  Filled 2020-03-17 (×4): qty 0.3

## 2020-03-17 NOTE — Progress Notes (Signed)
Patient left unit for hemodialysis before morning shift report.

## 2020-03-17 NOTE — TOC Progression Note (Signed)
Transition of Care [facility]) - Progression Note    Patient Details  Name: [name] MRN: [id number] Date of Birth: 12-16-1950  Transition of Care [facility]) CM/SW [place], [place] Phone Number: 03/17/2020, 12:41 PM  Clinical Narrative:    CSW spoke with [facility] and confirmed patient is her own legal guardian. A covid is needed at discharge. TOC team will continue to follow.   Expected Discharge Plan: Skilled Nursing Facility Barriers to Discharge: Continued Medical Work up  Expected Discharge Plan and Services Expected Discharge Plan: [place]       Living arrangements for the past 2 months: [place]                                       Social Determinants of Health (SDOH) Interventions    Readmission Risk Interventions Readmission Risk Prevention Plan 03/11/2020 09/10/2019 11/29/2018  Transportation Screening Complete Complete Complete  Medication Review [employer] photographer) - Complete Complete  PCP or Specialist appointment within 3-5 days of discharge - - Not Complete  PCP/Specialist Appt Not Complete comments - - earliest appt available was 12/08/18  [place] or Home Care Consult - - Complete  SW Recovery Care/Counseling Consult Complete - Complete  Palliative Care Screening - - Not Applicable  Skilled Nursing Facility Complete - Not Applicable  Some recent data might be hidden

## 2020-03-17 NOTE — Progress Notes (Signed)
OT Cancellation Note  Patient Details Name: [name] MRN: [id number] DOB: [birth year]   Cancelled Treatment:    Reason Eval/Treat Not Completed: Patient at procedure or test/ unavailable (HD), will follow up as able.  Lou Cal, OT Acute Rehabilitation Services Pager [phone] Office [phone]   Raymondo Band 03/17/2020, 7:47 AM

## 2020-03-17 NOTE — Procedures (Signed)
I was present at this dialysis session. I have reviewed the session itself and made appropriate changes.   Vital signs in last 24 hours:  Temp:  [98 F (36.7 C)-98.6 F (37 C)] 98.5 F (36.9 C) (08/02 0703) Pulse Rate:  [100-111] 103 (08/02 0800) Resp:  [16-25] 22 (08/02 0800) BP: (109-142)/(77-88) 142/82 (08/02 0800) SpO2:  [96 %-98 %] 98 % (08/02 0341) Weight:  [87.2 kg-89.3 kg] 89.3 kg (08/02 0703) Weight change: -0.8 kg Filed Weights   03/16/20 0000 03/17/20 0031 03/17/20 0703  Weight: 88 kg 87.2 kg 89.3 kg    Recent Labs  Lab 03/17/20 0328  NA 138  K 3.6  CL 99  CO2 26  GLUCOSE 151*  BUN 73*  CREATININE 4.78*  CALCIUM 9.4  PHOS 2.6    Recent Labs  Lab 03/15/20 0500 03/16/20 0535 03/17/20 0328  WBC 6.5 10.0 11.0*  HGB 8.6* 8.4* 8.9*  HCT 27.1* 26.8* 27.6*  MCV 112.9* 112.6* 111.7*  PLT 59* 74* 89*    Scheduled Meds: . ARIPiprazole  2 mg Oral Daily  . aspirin EC  81 mg Oral Q breakfast  . buPROPion  150 mg Oral Daily  . calcitRIOL  0.25 mcg Oral Q M,W,F  . calcium acetate  2,001 mg Oral TID WC  . Chlorhexidine Gluconate Cloth  6 each Topical Daily  . diltiazem  60 mg Oral Q6H  . feeding supplement (ENSURE ENLIVE)  237 mL Oral BID BM  . insulin aspart  0-5 Units Subcutaneous QHS  . insulin aspart  0-6 Units Subcutaneous TID WC  . insulin aspart  2 Units Subcutaneous TID WC  . insulin detemir  20 Units Subcutaneous BID  . levETIRAcetam  250 mg Oral BID  . levothyroxine  50 mcg Oral QAC breakfast  . mouth rinse  15 mL Mouth Rinse BID  . metoprolol tartrate  50 mg Oral BID  . midodrine  5 mg Oral TID WC  . montelukast  10 mg Oral QHS  . multivitamin  1 tablet Oral QHS  . pantoprazole  40 mg Oral Daily  . predniSONE  10 mg Oral Q breakfast  . primidone  50 mg Oral Daily  . sodium chloride flush  10-40 mL Intracatheter Q12H  . Thrombi-Pad  1 each Topical Once  . traZODone  50 mg Oral QHS   Continuous Infusions: . sodium chloride    . amiodarone 30  mg/hr (03/17/20 0601)  . ampicillin (OMNIPEN) IV 2 g (03/17/20 0320)  . phenylephrine (NEO-SYNEPHRINE) Adult infusion Stopped (03/12/20 1435)   PRN Meds:.acetaminophen, albuterol, calcium carbonate, diphenhydrAMINE, docusate sodium, EPINEPHrine, heparin, metoprolol tartrate, nitroGLYCERIN, ondansetron (ZOFRAN) IV, Resource ThickenUp Clear, sodium chloride flush     [facility]  [facility] Bath2/2.5, Dialyzerunknown, Heparindoes not appear to receive heparin with her treatment, only in the catheter. Accessusing TDC.  Assessment/Plan: 1. Enterococcal bacteremia with septic shock- resolved 2. ESRD- off schedule since transitioning from CRRT to IHD.  Will get back on schedule later this week. 3. Vascular access- TDC removed 7/29, using RAVG. 4. A fib with RVR- on amio.   5. AMS- waxes and wanes. 6. Acute Hypoxic respiratory failure- currently on [place] 7. ABLA- from [facility] central line.  S/p transfusion.  Eliquis held and FFP given. 8. Thrombocytopenia- per primary. Donetta Potts,  MD 03/17/2020, 8:09 AM

## 2020-03-17 NOTE — Progress Notes (Addendum)
Progress Note  Patient Name: [name] Date of Encounter: 03/17/2020  [facility] Cardiologist: Rozann Lesches, MD   Subjective   She was seen in HD, She is much more alert this morning and answers questions. No chest pain, palpitations, or dyspnea.  Inpatient Medications    Scheduled Meds: . amiodarone  200 mg Oral BID  . ARIPiprazole  2 mg Oral Daily  . aspirin EC  81 mg Oral Q breakfast  . buPROPion  150 mg Oral Daily  . calcitRIOL      . calcitRIOL  0.25 mcg Oral Q M,W,F  . calcium acetate  2,001 mg Oral TID WC  . Chlorhexidine Gluconate Cloth  6 each Topical Daily  . diltiazem  60 mg Oral Q6H  . feeding supplement (ENSURE ENLIVE)  237 mL Oral BID BM  . insulin aspart  0-5 Units Subcutaneous QHS  . insulin aspart  0-6 Units Subcutaneous TID WC  . insulin aspart  2 Units Subcutaneous TID WC  . insulin detemir  30 Units Subcutaneous BID  . levETIRAcetam  250 mg Oral BID  . levothyroxine  50 mcg Oral QAC breakfast  . mouth rinse  15 mL Mouth Rinse BID  . metoprolol tartrate  50 mg Oral BID  . midodrine  5 mg Oral TID WC  . montelukast  10 mg Oral QHS  . multivitamin  1 tablet Oral QHS  . pantoprazole  40 mg Oral Daily  . predniSONE  10 mg Oral Q breakfast  . primidone  50 mg Oral Daily  . sodium chloride flush  10-40 mL Intracatheter Q12H  . Thrombi-Pad  1 each Topical Once  . traZODone  50 mg Oral QHS   Continuous Infusions: . sodium chloride    . ampicillin (OMNIPEN) IV 2 g (03/17/20 0320)  . phenylephrine (NEO-SYNEPHRINE) Adult infusion Stopped (03/12/20 1435)   PRN Meds: acetaminophen, albuterol, calcium carbonate, diphenhydrAMINE, docusate sodium, EPINEPHrine, heparin, metoprolol tartrate, nitroGLYCERIN, ondansetron (ZOFRAN) IV, Resource ThickenUp Clear, sodium chloride flush   Vital Signs    Vitals:   03/17/20 0900 03/17/20 0930 03/17/20 1000 03/17/20 1030  BP: 126/74 (!) 136/76 (!) 136/100 126/82  Pulse: (!) 106 96 (!) 109 (!) 112  Resp: 17  18 15 13   Temp:      TempSrc:      SpO2:      Weight:      Height:        Intake/Output Summary (Last 24 hours) at 03/17/2020 1047 Last data filed at 03/16/2020 2200 Gross per 24 hour  Intake 200 ml  Output 0 ml  Net 200 ml   Last 3 Weights 03/17/2020 03/17/2020 03/16/2020  Weight (lbs) 196 lb 13.9 oz 192 lb 3.9 oz 194 lb 0.1 oz  Weight (kg) 89.3 kg 87.2 kg 88 kg      Telemetry    Atrial fibrillation with ventricular rate 100s - Personally Reviewed  ECG    No new tracings - Personally Reviewed  Physical Exam   GEN: No acute distress.   Neck: No JVD Cardiac: irregular rhythm, tachycardic rate Respiratory: Clear to auscultation bilaterally. GI: Soft, nontender, non-distended  MS: feet wrapped Neuro:  Nonfocal  Psych: Normal affect   Labs    High Sensitivity Troponin:   Recent Labs  Lab 03/10/20 1032 03/10/20 1323  TROPONINIHS 42* 56*      Chemistry Recent Labs  Lab 03/13/20 0214 03/14/20 0415 03/14/20 1812 03/14/20 1812 03/15/20 0500 03/16/20 0535 03/17/20 0328  NA 135   < >  --   --  138 136 138  K 4.0   < >  --   --  3.9 4.0 3.6  CL 100   < >  --   --  100 98 99  CO2 23   < >  --   --  25 25 26   GLUCOSE 252*   < >  --   --  326* 394* 151*  BUN 32*   < >  --   --  38* 62* 73*  CREATININE 2.21*   < >  --   --  2.99* 4.06* 4.78*  CALCIUM 8.7*   < >  --   --  8.6* 9.0 9.4  PROT 5.2*  --  4.9*  --  4.8*  --   --   ALBUMIN 3.0*  3.0*   < > 2.8*   < > 2.6* 2.8* 2.7*  AST 1,563*  --  366*  --  181*  --   --   ALT 2,005*  --  1,393*  --  1,118*  --   --   ALKPHOS 140*  --  145*  --  129*  --   --   BILITOT 1.4*  --  1.6*  --  1.2  --   --   GFRNONAA 22*   < >  --   --  15* 11* 9*  GFRAA 26*   < >  --   --  18* 12* 10*  ANIONGAP 12   < >  --   --  13 13 13    < > = values in this interval not displayed.     Hematology Recent Labs  Lab 03/15/20 0500 03/16/20 0535 03/17/20 0328  WBC 6.5 10.0 11.0*  RBC 2.40* 2.38* 2.47*  HGB 8.6* 8.4* 8.9*  HCT 27.1*  26.8* 27.6*  MCV 112.9* 112.6* 111.7*  MCH 35.8* 35.3* 36.0*  MCHC 31.7 31.3 32.2  RDW 16.0* 15.9* 15.9*  PLT 59* 74* 89*    BNP No results for input(s): BNP, PROBNP in the last 168 hours.   DDimer No results for input(s): DDIMER in the last 168 hours.   Radiology    No results found.  Cardiac Studies   Echo 03/11/20: 1. Challenging to estimate EF with underlying tachycardia, however does  not appear severly reduced.  2. Left ventricular ejection fraction, by estimation, is 45 to 50%. The  left ventricle has mildly decreased function. The left ventricle  demonstrates global hypokinesis. Left ventricular diastolic parameters are  indeterminate.  3. Right ventricular systolic function is mildly reduced. The right  ventricular size is moderately enlarged. There is moderately elevated  pulmonary artery systolic pressure. The estimated right ventricular  systolic pressure is 00.3 mmHg.  4. Left atrial size was severely dilated.  5. Right atrial size was moderately dilated.  6. The mitral valve is normal in structure. Mild mitral valve  regurgitation. No evidence of mitral stenosis.  7. Tricuspid valve regurgitation is moderate to severe.  8. The aortic valve is tricuspid. Aortic valve regurgitation is not  visualized. Mild to moderate aortic valve sclerosis/calcification is  present, without any evidence of aortic stenosis.  9. The inferior vena cava is dilated in size with <50% respiratory  variability, suggesting right atrial pressure of 15 mmHg.   Patient Profile     69 y.o. female with a hx of chronic diastolic HF, persistent atrial fibrillation/flutter on anticoagulation with Eliquis, anemia, mild to moderate aortic stenosis, ESRD on HD, HTN, HLD, bifascicular block(known RBBB+LAFB),  COPD, DM 2, history of PE, seizure disorder, schizophrenia, Barrett's esophagus, memory loss and OSAwho was seen yesterday in the emergency room for A. fib with RVR and hypotension  sec related to septic shock  Assessment & Plan    Persistent atrial fibrillation with RVR Hypotension - rate has been difficult to control, complicated by hypotension - amiodarone drip, 50 mg lopressor BID, and 60 mg cardizem q6hr - EP consulted  - would benefit from TEE-guided cardioversion once able to stay on [facility] - has been doing better with midodrine - she has been on IV amiodarone for 8 days --> would favor transitioning this to 200 mg BID x 5 days, then 200 mg daily - rates appear better controlled and her pressure is 354-656C systolic - if she does well today after HD, would consider consolidating cardizem   Mildly reduced EF Chronic diastolic dysfunction - echo with EF 45-50% - cardizem to help with rate control for now  - volume management for HD   ESRD on HD - per nephrology   Acute on chronic respiratory failure - volume per HD   Moderate to severe TR - aortic sclerosis without stenosis - likely contributing to dyspnea   Elevated LFTs - trending down last week - holding statin - recheck tomorrow   Septic shock due to enterococcus faecalis, RLL PNA - HD catheter removed, positive blood cultures - may need TEE once more stable - continue ABX per primary and ID      For questions or updates, please contact [facility] Please consult [email or web address] for contact info under        Signed, Ledora Bottcher, PA  03/17/2020, 10:47 AM     Patient seen and examined. Agree with assessment and plan. Pt has received iv amiodarone for 8 days with plan to transition to oral therapy. Will need to F/U LFTs which have been significantly elevated and peaked at AST 1563 and ALT 2005 on 7/29.  Also with markedly elevated MCV at 115;  B12/folate levels are normal on 7/27. Thrombocytopenia with plts 89K; slightly improving.  No chest pain. AF rate now 108 - 114 with improved rate control also on cardizem and metoprolol.   Troy Sine, MD, [facility] 03/17/2020 11:28 AM

## 2020-03-17 NOTE — NC FL2 (Signed)
[place] LEVEL OF CARE SCREENING TOOL     IDENTIFICATION  Patient Name: [name] Birthdate: 04-16-1951 Sex: female Admission Date (Current Location): 03/10/2020  [facility] and [place] Number:  Herbalist and Address:  [facility] [place], [place] [address], [place], [place] [address]      Provider Number: [id number]  Attending Physician Name and Address:  Charlynne Cousins, MD  Relative Name and Phone Number:       Current Level of Care: Hospital Recommended Level of Care: [place] Prior Approval Number:    Date Approved/Denied:   PASRR Number: [id number]  Discharge Plan: SNF    Current Diagnoses: Patient Active Problem List   Diagnosis Date Noted  . Palliative care by specialist   . Bacteremia 03/11/2020  . Atrial fibrillation with RVR ([place]) 03/10/2020  . Macrocytic anemia 03/10/2020  . Thrombocytopenia ([place]) 03/10/2020  . Hyperkalemia 03/10/2020  . Septic shock ([place]) 03/10/2020  . Elevated liver enzymes 03/10/2020  . Respiratory failure with hypoxia ([place]) 01/24/2020  . Hypoxia 01/23/2020  . Acute on chronic respiratory failure with hypoxia ([place]) 01/23/2020  . Persistent atrial fibrillation ([place])   . Cellulitis and abscess of right leg 11/23/2019  . ESRD (end stage renal disease) ([place]) 11/23/2019  . Elevated troponin   . Atrial fibrillation with rapid ventricular response ([place]) 11/22/2019  . Acute pulmonary edema (HCC)   . Acute respiratory failure with hypoxia and hypercapnia ([place]) 09/10/2019  . SVT (supraventricular tachycardia) ([place]) 09/10/2019  . On mechanically assisted ventilation ([place]) 09/08/2019  . Acute metabolic encephalopathy 84/16/6063  . Volume overload 09/06/2019  . AKI (acute kidney injury) ([place]) 09/06/2019  . Altered mental state 11/28/2018  . Near syncope 11/28/2018  . Fall at home 11/28/2018  . History of seizure 11/28/2018  . 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  Pressure injury of skin 09/17/2018  . Respiratory failure ([place]) 08/16/2018  . Seizure ([place]) 08/16/2018  . Acute encephalopathy 07/27/2018  . Endotracheally intubated 07/27/2018  . Hypothyroidism 07/27/2018  . Pleuritic chest pain 07/14/2018  . Moderate persistent asthma without complication   . Type 2 diabetes mellitus ([place]) 04/19/2018  . Acute kidney injury superimposed on chronic kidney disease ([place]) 01/13/2018  . Altered mental status   . Encephalopathy 10/11/2016  . Chest pain 08/12/2016  . CKD (chronic kidney disease), stage IV ([place]) 08/12/2016  . History of pulmonary embolus (PE) 08/12/2016  . Obesity, Class III, BMI 40-49.9 (morbid obesity) ([place]) 08/12/2016  . RBBB 08/12/2016  . Positive D dimer 08/12/2016  . Cerebral infarction ([place]) 11/10/2015  . Physical deconditioning 07/01/2011  . Weakness 07/01/2011  . Diabetic foot ulcer ([place]) 05/19/2011  . Polypharmacy 02/09/2011  . BACK PAIN WITH RADICULOPATHY 05/25/2010  . Type 2 diabetes mellitus with diabetic polyneuropathy, without long-term current use of insulin ([place]) 08/18/2009  . Acute on chronic diastolic CHF (congestive heart failure) ([place]) 04/25/2009  . Normocytic anemia 04/14/2009  . Sleep apnea 12/11/2008  . Diabetic peripheral neuropathy ([place]) 05/04/2007  . HYPERCHOLESTEROLEMIA 10/13/2006  . Gout, unspecified 10/13/2006  . HYPOKALEMIA 10/13/2006  . Schizophrenia ([place]) 10/13/2006  . Depression 10/13/2006  . Essential hypertension 10/13/2006  . Venous (peripheral) insufficiency 10/13/2006  . RHINITIS, ALLERGIC 10/13/2006  . Asthma 10/13/2006  . Reflux esophagitis 10/13/2006  . OSTEOARTHRITIS, MULTI SITES 10/13/2006  . INCONTINENCE, URGE 10/13/2006    Orientation RESPIRATION BLADDER Height & Weight     Self, Time, Place  O2 (Nasal Cannula 2 liters) Continent  Weight: 192 lb 7.4 oz (87.3 kg) Height:  5\' 2"  (157.5 cm)  BEHAVIORAL SYMPTOMS/MOOD NEUROLOGICAL BOWEL NUTRITION STATUS      Continent Diet (See  Discharge Summary)  AMBULATORY STATUS COMMUNICATION OF NEEDS Skin   Extensive Assist Verbally Surgical wounds, Other (Comment), PU Stage and Appropriate Care (Unstageable PU L Heel; unstageable PU R heel;Closed incision R arm; Ecchymosis generalized;MASD groin/abdomen;Cracking on heels)       PU Stage 4 Dressing:  (Unstageable PU Billateral with foam to be changed every 3 days)               Personal Care Assistance Level of Assistance  Bathing, Feeding, Dressing Bathing Assistance: Maximum assistance Feeding assistance: Independent (able to feed self;Fluid restriction renal) Dressing Assistance: Maximum assistance     Functional Limitations Info  Sight, Hearing, Speech Sight Info: Adequate Hearing Info: Adequate Speech Info: Adequate    SPECIAL CARE FACTORS FREQUENCY  PT (By licensed PT), OT (By licensed OT)     PT Frequency: 5x min weekly OT Frequency: 5x min weekly            Contractures Contractures Info: Not present    Additional Factors Info  Code Status, Allergies, Insulin Sliding Scale Code Status Info: FULL Allergies Info: Benztropine Mesylate,Codeine,Diazepam,Diphenhydramine Hcl,Penicillins,Sulfonamide Derivatives,Thioridazine Hcl,Bee Venom,Lisinopril,   Insulin Sliding Scale Info: insulin aspart (novoLOG) injection 0-15 Units 3 times daily with meals,insulin aspart (novoLOG) injection 0-5 Units Daily at bedtime       Current Medications (03/17/2020):  This is the current hospital active medication list Current Facility-Administered Medications  Medication Dose Route Frequency Provider Last Rate Last Admin  . 0.9 %  sodium chloride infusion  250 mL Intravenous Continuous Rigoberto Noel, MD      . acetaminophen (TYLENOL) tablet 650 mg  650 mg Oral Q4H PRN Pahwani, Rinka R, MD      . albuterol (PROVENTIL) (2.5 MG/3ML) 0.083% nebulizer solution 2.3333 mg  2.3333 mg Inhalation Q6H PRN Pahwani, Rinka R, MD      . amiodarone (PACERONE) tablet 200 mg  200 mg Oral  BID Duke, Tami Lin, PA      . ampicillin (OMNIPEN) 2 g in sodium chloride 0.9 % 100 mL IVPB  2 g Intravenous Q12H Tommy Medal, Lavell Islam, MD 300 mL/hr at 03/17/20 0320 2 g at 03/17/20 0320  . ARIPiprazole (ABILIFY) tablet 2 mg  2 mg Oral Daily Pahwani, Rinka R, MD   2 mg at 03/16/20 0935  . aspirin EC tablet 81 mg  81 mg Oral Q breakfast Pahwani, Rinka R, MD   81 mg at 03/16/20 0935  . buPROPion (WELLBUTRIN XL) 24 hr tablet 150 mg  150 mg Oral Daily Tommy Medal, Lavell Islam, MD   150 mg at 03/16/20 0935  . calcitRIOL (ROCALTROL) 0.25 MCG capsule           . calcitRIOL (ROCALTROL) capsule 0.25 mcg  0.25 mcg Oral Q M,W,F Pahwani, Rinka R, MD   0.25 mcg at 03/17/20 0946  . calcium acetate (PHOSLO) capsule 2,001 mg  2,001 mg Oral TID WC Pahwani, Rinka R, MD   2,001 mg at 03/16/20 1642  . calcium carbonate (TUMS - dosed in mg elemental calcium) chewable tablet 200 mg of elemental calcium  1 tablet Oral Q6H PRN Pahwani, Rinka R, MD      . Chlorhexidine Gluconate Cloth 2 % PADS 6 each  6 each Topical Daily Pahwani, Rinka R, MD   6 each at 03/17/20 1217  . diltiazem (  CARDIZEM) tablet 60 mg  60 mg Oral Q6H Charlynne Cousins, MD   60 mg at 03/17/20 0601  . diphenhydrAMINE (BENADRYL) injection 25 mg  25 mg Intravenous Once PRN Tommy Medal, Lavell Islam, MD      . docusate sodium (COLACE) capsule 100 mg  100 mg Oral Daily PRN Rigoberto Noel, MD   100 mg at 03/11/20 1510  . EPINEPHrine (EPI-PEN) injection 0.3 mg  0.3 mg Intramuscular Once PRN Tommy Medal, Lavell Islam, MD      . feeding supplement (ENSURE ENLIVE) (ENSURE ENLIVE) liquid 237 mL  237 mL Oral BID BM Rigoberto Noel, MD   237 mL at 03/16/20 1354  . heparin injection 1,000-6,000 Units  1,000-6,000 Units CRRT PRN Claudia Desanctis, MD   3,000 Units at 03/12/20 1313  . HYDROcodone-acetaminophen (NORCO/VICODIN) 5-325 MG per tablet 1-2 tablet  1-2 tablet Oral Q4H PRN Charlynne Cousins, MD   1 tablet at 03/17/20 1140  . HYDROcodone-acetaminophen (NORCO/VICODIN)  5-325 MG per tablet           . insulin aspart (novoLOG) injection 0-5 Units  0-5 Units Subcutaneous QHS Charlynne Cousins, MD   3 Units at 03/16/20 2223  . insulin aspart (novoLOG) injection 0-6 Units  0-6 Units Subcutaneous TID WC Charlynne Cousins, MD   6 Units at 03/16/20 1642  . insulin aspart (novoLOG) injection 2 Units  2 Units Subcutaneous TID WC Charlynne Cousins, MD   2 Units at 03/16/20 1643  . insulin detemir (LEVEMIR) injection 30 Units  30 Units Subcutaneous BID Charlynne Cousins, MD      . levETIRAcetam (KEPPRA) tablet 250 mg  250 mg Oral BID Pahwani, Rinka R, MD   250 mg at 03/16/20 2035  . levothyroxine (SYNTHROID) tablet 50 mcg  50 mcg Oral QAC breakfast Pahwani, Rinka R, MD   50 mcg at 03/17/20 0601  . MEDLINE mouth rinse  15 mL Mouth Rinse BID Rigoberto Noel, MD   15 mL at 03/16/20 0936  . metoprolol tartrate (LOPRESSOR) injection 5 mg  5 mg Intravenous Q6H PRN Charlynne Cousins, MD   5 mg at 03/14/20 0752  . metoprolol tartrate (LOPRESSOR) tablet 50 mg  50 mg Oral BID Charlynne Cousins, MD   50 mg at 03/16/20 2035  . midodrine (PROAMATINE) tablet 5 mg  5 mg Oral TID WC Arnoldo Lenis, MD   5 mg at 03/16/20 1642  . montelukast (SINGULAIR) tablet 10 mg  10 mg Oral QHS Pahwani, Rinka R, MD   10 mg at 03/16/20 2035  . multivitamin (RENA-VIT) tablet 1 tablet  1 tablet Oral QHS Rigoberto Noel, MD   1 tablet at 03/16/20 2035  . nitroGLYCERIN (NITROSTAT) SL tablet 0.4 mg  0.4 mg Sublingual Q5 Min x 3 PRN Pahwani, Rinka R, MD      . ondansetron (ZOFRAN) injection 4 mg  4 mg Intravenous Q6H PRN Pahwani, Rinka R, MD   4 mg at 03/11/20 1806  . pantoprazole (PROTONIX) EC tablet 40 mg  40 mg Oral Daily Pahwani, Rinka R, MD   40 mg at 03/16/20 0935  . phenylephrine CONCENTRATED 100mg  in sodium chloride 0.9% 264mL (0.4mg /mL) infusion  0-400 mcg/min Intravenous Titrated Rigoberto Noel, MD   Stopped at 03/12/20 1435  . predniSONE (DELTASONE) tablet 10 mg  10 mg Oral Q  breakfast Tommy Medal, Lavell Islam, MD   10 mg at 03/16/20 0935  . primidone (MYSOLINE) tablet 50 mg  50 mg Oral Daily Rigoberto Noel, MD   50 mg at 03/16/20 0935  . Resource ThickenUp Clear   Oral PRN Charlynne Cousins, MD      . sodium chloride flush (NS) 0.9 % injection 10-40 mL  10-40 mL Intracatheter Q12H Desai, Rahul P, PA-C   10 mL at 03/16/20 2224  . sodium chloride flush (NS) 0.9 % injection 10-40 mL  10-40 mL Intracatheter PRN Desai, Rahul P, PA-C      . Thrombi-Pad 3"X3" pad 1 each  1 each Topical Once Audria Nine, DO      . traZODone (DESYREL) tablet 50 mg  50 mg Oral QHS Tommy Medal, Lavell Islam, MD   50 mg at 03/16/20 2035     Discharge Medications: Please see discharge summary for a list of discharge medications.  Relevant Imaging Results:  Relevant Lab Results:   Additional Information [phone] Outpatient palliative to follow.  [place], LCSWA

## 2020-03-17 NOTE — Progress Notes (Signed)
Patient returned to unit. No complaints or distress at this time.

## 2020-03-17 NOTE — Progress Notes (Signed)
[facility] PROGRESS NOTE    Progress Note  [name]  DXI:[id number] DOB: January 17, 1951 DOA: 03/10/2020 PCP: Glenda Chroman, MD     Brief Narrative:   [name] is an 69 y.o. female past medical history of chronic respiratory failure with hypoxia secondary to underlying COPD on 3 L of oxygen, A. fib on Eliquis end-stage renal disease on hemodialysis chronic diastolic heart failure moderate AS, who came into the hospital for hyperglycemia and tachycardia was found in A. fib with RVR, decompensated systolic heart failure and septic shock who presented to the ED with atrial fibrillation and RVR with a heart rate of 170 was started on a diltiazem drip on arrival to the ED heart rate improved however she developed progressive hypotension and was transitioned to amiodarone due to hypotension PCCM was consulted and started on pressors for septic shock due to right lower lobe pneumonia.  03/10/2020 blood cultures grew Enterococcus faecalis, repeated surveillance blood cultures remain negative. 03/11/2020-7.29.2021 V linezolid 7.29.2021 ampicillin. 03/13/2020.IR successfully remove her tunneled hemodialysis catheter  03/11/2020 2D echo that showed an EF of 45% with mildly reduced RV function 03/10/2020 Initiated on CRRT 03/10/2020 Off pressors on  7.30/2021Started on hemodialysis Assessment/Plan:   Septic shock due to Enterococcus faecalis probably due to her HD catheter: ID was consulted he was sensitive to Vanco, then switch to Zyvox,Now switched to ampicillin. We will need a TEE to rule out vegetation. She will need a line holiday.   Central line discontinued on 03/16/2020. Tunnel catheter successfully removed on 03/13/2020. Repeat blood cultures on 03/12/2020 and 7.30.2021 are negative till date. Reviewed ID's assistance further management per ID. Audiology is on board MVR to schedule TEE to rule out vegetation, it will determine the length of the antibiotic regimen.  Bleeding  subclavian line: She is transfused 1 unit of platelets and 2 units of fresh frozen plasma, Eliquis was held, INR has improved to 1.7 a..  Platelet count is greater than 50,000 artificially after 1 unit of platelets, platelet count continue to improve. Continue to hold anticoagulation. Bleeding has resolved. Cardiology to dictate when to start anticoagulation.  Persistent atrial fibrillation with RVR: Probably driven by her infectious bacteremia, she is currently on amiodarone drip. Due to heart control heart rate EP was consulted recommended to continue amiodarone, metoprolol and diltiazem Radiology is on board and appreciate assistance she is currently on diltiazem 60 metoprolol 50 and amiodarone drip.  Her heart rate has been persistently in the low 100s.  Acute on chronic respiratory failure with hypoxia acute decompensated diastolic heart failure: Her volume is being managed by nephrology.  Acute metabolic/toxic encephalopathy: Her ammonia is level currently on lactulose.  Neurology on board. Neurology recommended to continue home primidone and Keppra. Encephalopathy is slowly resolving. Multifactorial in the setting of Keppra infectious etiology.  End-stage renal disease: On hemodialysis Monday Tuesday and Thursday further management of her volume status per renal. For dialysis on 03/14/2020 per renal, we consulted IR to remove HD catheter on 03/13/2020  Transaminitis: Likely due to shock liver it is continued to worsen despite correcting hypotension. Right upper quadrant ultrasound showed no obstructive jaundice. Transaminases are improving.  Elevated ammonia level: Continue lactulose.  Insulin-dependent diabetes mellitus type 2: Blood glucose continues to be significantly elevated increase long-acting insulin continue sliding scale. Hemoglobin A1c was 8.0.  Elevated troponins: Likely due to demand ischemia, further management per cardiology. Titrating metoprolol up to  control heart rate.  Hypothyroidism: Continue Synthroid.  Rheumatoid arthritis: On chronic prednisone  continue current regimen.  Gout, unspecified  Schizophrenia ([place])  Acute Thrombocytopenia ([place]): Unlikely to be HIT, likely due to infectious etiology plus or minus uremia. Platelet is trending up slowly.  Hyperkalemia Per renal.  Now improved after dialysis.  Goals of care: It would be a good idea to get hospice imperative care involved discussed goals of care.  Stage II ulcer present on admission: RN Pressure Injury Documentation: Pressure Injury 03/10/20 Heel Left Unstageable - Full thickness tissue loss in which the base of the injury is covered by slough (yellow, tan, gray, green or brown) and/or eschar (tan, brown or black) in the wound bed. 3 cm x 2 cm (Active)  03/10/20 1730  Location: Heel  Location Orientation: Left  Staging: Unstageable - Full thickness tissue loss in which the base of the injury is covered by slough (yellow, tan, gray, green or brown) and/or eschar (tan, brown or black) in the wound bed.  Wound Description (Comments): 3 cm x 2 cm  Present on Admission: Yes    Estimated body mass index is 36.01 kg/m as calculated from the following:   Height as of this encounter: 5\' 2"  (1.575 m).   Weight as of this encounter: 89.3 kg. Malnutrition Type:  Nutrition Problem: Increased nutrient needs Etiology: acute illness   Malnutrition Characteristics:  Signs/Symptoms: estimated needs   Nutrition Interventions:  Interventions: Liberalize Diet, Magic cup, MVI, Ensure Enlive (each supplement provides 350kcal and 20 grams of protein)    DVT prophylaxis: none Family Communication:none Status is: Inpatient  Remains inpatient appropriate because:Hemodynamically unstable   Dispo: The patient is from: Home              Anticipated d/c is to: Home              Anticipated d/c date is: > 3 days              Patient currently is not medically stable to  d/c.   Code Status:     Code Status Orders  (From admission, onward)         Start     Ordered   03/10/20 1229  Full code  Continuous        03/10/20 1229        Code Status History    Date Active Date Inactive Code Status Order ID Comments User Context   01/23/2020 1928 01/26/2020 0003 Full Code [id number]  Vashti Hey, MD ED   11/22/2019 2141 11/27/2019 2110 Full Code [id number]  Bethena Roys, MD Inpatient   09/06/2019 0822 10/01/2019 2342 Full Code [id number]  Roxan Hockey, MD ED   11/28/2018 1635 11/29/2018 1904 Full Code [id number]  Murlean Iba, MD Inpatient   09/15/2018 1138 09/19/2018 1837 Full Code [id number]  Heath Lark D, DO ED   09/13/2018 0154 09/15/2018 1133 Full Code [id number]  Ripley Fraise, MD ED   08/16/2018 0603 08/20/2018 1453 Full Code [id number]  Aldean Jewett, MD Inpatient   07/27/2018 2032 08/01/2018 1538 Full Code [id number]  Arnell Asal, NP ED   07/14/2018 2209 07/15/2018 1634 Full Code [id number]  Vianne Bulls, MD ED   04/19/2018 0144 04/21/2018 1754 Full Code [id number]  Reubin Milan, MD ED   01/12/2018 1428 01/14/2018 0135 Full Code [id number]  Lorella Nimrod, MD ED   10/12/2016 0306 10/13/2016 1730 Full Code [id number]  Edwin Dada, MD Inpatient   08/12/2016 0128 08/13/2016 1809 Full Code [id number]  Lily Kocher, MD Inpatient   11/11/2015  0032 11/12/2015 1751 Full Code [id number]  Bethena Roys, MD Inpatient   Advance Care Planning Activity        IV Access:    Peripheral IV   Procedures and diagnostic studies:   No results found.   Medical Consultants:    None.  Anti-Infectives:   Ampicillin  Subjective:    [name] has no new complaints today.  Objective:    Vitals:   03/17/20 0730 03/17/20 0800 03/17/20 0830 03/17/20 0900  BP: 125/85 (!) 142/82 (!) 139/89 126/74  Pulse: (!) 106 103 104 (!) 106  Resp: (!) 25 22 18 17   Temp:      TempSrc:      SpO2:      Weight:       Height:       SpO2: 98 % O2 Flow Rate (L/min): 3 L/min FiO2 (%): 40 %   Intake/Output Summary (Last 24 hours) at 03/17/2020 0924 Last data filed at 03/16/2020 2200 Gross per 24 hour  Intake 200 ml  Output 0 ml  Net 200 ml   Filed Weights   03/16/20 0000 03/17/20 0031 03/17/20 0703  Weight: 88 kg 87.2 kg 89.3 kg    Exam: General exam: In no acute distress. Respiratory system: Good air movement and clear to auscultation. Cardiovascular system: S1 & S2 heard, RRR. No JVD. Gastrointestinal system: Abdomen is nondistended, soft and nontender.  Extremities: No pedal edema. Skin: No rashes, lesions or ulcers   Data Reviewed:    Labs: Basic Metabolic Panel: Recent Labs  Lab 03/10/20 1048 03/10/20 1323 03/11/20 0335 03/11/20 1517 03/12/20 0300 03/12/20 0301 03/13/20 0214 03/13/20 0214 03/14/20 0415 03/14/20 0415 03/15/20 0500 03/15/20 0500 03/16/20 0535 03/17/20 0328  NA   < >  --  140   < >  --    < > 135  --  140  --  138  --  136 138  K   < >  --  3.8   < >  --    < > 4.0   < > 3.9   < > 3.9   < > 4.0 3.6  CL   < >  --  105   < >  --    < > 100  --  103  --  100  --  98 99  CO2   < >  --  21*   < >  --    < > 23  --  25  --  25  --  25 26  GLUCOSE   < >  --  111*   < >  --    < > 252*  --  213*  --  326*  --  394* 151*  BUN   < >  --  36*   < >  --    < > 32*  --  49*  --  38*  --  62* 73*  CREATININE   < >  --  2.61*   < >  --    < > 2.21*  --  3.18*  --  2.99*  --  4.06* 4.78*  CALCIUM   < >  --  8.3*   < >  --    < > 8.7*  --  8.9  --  8.6*  --  9.0 9.4  MG  --  2.3 2.1  --  2.5*  --   --   --   --   --   --   --   --   --  PHOS  --   --  3.2   < >  --    < > 2.3*  --  3.7  --  3.7  --  3.2 2.6   < > = values in this interval not displayed.   GFR Estimated Creatinine Clearance: 11.7 mL/min (A) (by C-G formula based on SCr of 4.78 mg/dL (H)). Liver Function Tests: Recent Labs  Lab 03/10/20 1112 03/11/20 0335 03/13/20 0214 03/13/20 0214 03/14/20 0415  03/14/20 1812 03/15/20 0500 03/16/20 0535 03/17/20 0328  AST 432*  --  1,563*  --   --  366* 181*  --   --   ALT 330*  --  2,005*  --   --  1,393* 1,118*  --   --   ALKPHOS 99  --  140*  --   --  145* 129*  --   --   BILITOT 1.5*  --  1.4*  --   --  1.6* 1.2  --   --   PROT 5.0*  --  5.2*  --   --  4.9* 4.8*  --   --   ALBUMIN 3.1*   < > 3.0*  3.0*   < > 2.8* 2.8* 2.6* 2.8* 2.7*   < > = values in this interval not displayed.   No results for input(s): LIPASE, AMYLASE in the last 168 hours. Recent Labs  Lab 03/10/20 1630 03/13/20 0352  AMMONIA 74* 50*   Coagulation profile Recent Labs  Lab 03/10/20 1112 03/11/20 0335 03/12/20 1058 03/13/20 0857 03/14/20 0415  INR 2.1* 2.3* 2.9* 2.0* 1.7*   COVID-19 Labs  No results for input(s): DDIMER, FERRITIN, LDH, CRP in the last 72 hours.  Lab Results  Component Value Date   [place] NEGATIVE 03/10/2020   [place] NEGATIVE 03/07/2020   [place] NEGATIVE 02/20/2020   [place] NEGATIVE 01/23/2020    CBC: Recent Labs  Lab 03/13/20 0214 03/14/20 0415 03/15/20 0500 03/16/20 0535 03/17/20 0328  WBC 8.6 9.9 6.5 10.0 11.0*  HGB 8.8* 9.1* 8.6* 8.4* 8.9*  HCT 27.7* 28.3* 27.1* 26.8* 27.6*  MCV 113.5* 114.6* 112.9* 112.6* 111.7*  PLT 46* 51* 59* 74* 89*   Cardiac Enzymes: No results for input(s): CKTOTAL, CKMB, CKMBINDEX, TROPONINI in the last 168 hours. BNP (last 3 results) No results for input(s): PROBNP in the last 8760 hours. CBG: Recent Labs  Lab 03/16/20 0559 03/16/20 1147 03/16/20 1630 03/16/20 2135 03/17/20 0614  GLUCAP 401* 352* 411* 298* 125*   D-Dimer: No results for input(s): DDIMER in the last 72 hours. Hgb A1c: No results for input(s): HGBA1C in the last 72 hours. Lipid Profile: No results for input(s): CHOL, HDL, LDLCALC, TRIG, CHOLHDL, LDLDIRECT in the last 72 hours. Thyroid function studies: No results for input(s): TSH, T4TOTAL, T3FREE, THYROIDAB in the last 72 hours.  Invalid  input(s): FREET3 Anemia work up: No results for input(s): VITAMINB12, FOLATE, FERRITIN, TIBC, IRON, RETICCTPCT in the last 72 hours. Sepsis Labs: Recent Labs  Lab 03/10/20 1032 03/10/20 1323 03/11/20 0335 03/11/20 0335 03/12/20 0300 03/12/20 1521 03/13/20 0214 03/13/20 0351 03/13/20 0625 03/14/20 0415 03/14/20 1812 03/15/20 0500 03/16/20 0535 03/17/20 0328  PROCALCITON  --  18.11 8.99  --  6.06  --   --  7.43  --   --   --   --   --   --   WBC   < >  --  20.9*   < > 15.3*   < >   < >  --   --  9.9  --  6.5 10.0 11.0*  LATICACIDVEN  --  2.7*  --   --   --   --   --  2.7* 3.4*  --  2.2*  --   --   --    < > = values in this interval not displayed.   Microbiology Recent Results (from the past 240 hour(s))  SARS Coronavirus 2 by RT PCR (hospital order, performed in [facility] hospital lab) Nasopharyngeal Nasopharyngeal Swab     Status: None   Collection Time: 03/10/20 10:32 AM   Specimen: Nasopharyngeal Swab  Result Value Ref Range Status   SARS Coronavirus 2 NEGATIVE NEGATIVE Final    Comment: (NOTE) SARS-CoV-2 target nucleic acids are NOT DETECTED.  The SARS-CoV-2 RNA is generally detectable in upper and lower respiratory specimens during the acute phase of infection. The lowest concentration of SARS-CoV-2 viral copies this assay can detect is 250 copies / mL. A negative result does not preclude SARS-CoV-2 infection and should not be used as the sole basis for treatment or other patient management decisions.  A negative result may occur with improper specimen collection / handling, submission of specimen other than nasopharyngeal swab, presence of viral mutation(s) within the areas targeted by this assay, and inadequate number of viral copies (<250 copies / mL). A negative result must be combined with clinical observations, patient history, and epidemiological information.  Fact Sheet for Patients:   StrictlyIdeas.no  Fact Sheet for Healthcare  Providers: BankingDealers.co.za  This test is not yet approved or  cleared by the [place] FDA and has been authorized for detection and/or diagnosis of SARS-CoV-2 by FDA under an Emergency Use Authorization (EUA).  This EUA will remain in effect (meaning this test can be used) for the duration of the COVID-19 declaration under Section 564(b)(1) of the Act, 21 U.S.C. section 360bbb-3(b)(1), unless the authorization is terminated or revoked sooner.  Performed at [facility], [place] [address] [place], [place] [address]   Blood Culture (routine x 2)     Status: Abnormal   Collection Time: 03/10/20 11:12 AM   Specimen: BLOOD  Result Value Ref Range Status   Specimen Description BLOOD LEFT ANTECUBITAL  Final   Special Requests   Final    BOTTLES DRAWN AEROBIC AND ANAEROBIC Blood Culture adequate volume   Culture  Setup Time   Final    GRAM POSITIVE COCCI IN CHAINS IN BOTH AEROBIC AND ANAEROBIC BOTTLES Organism ID to follow CRITICAL RESULT CALLED TO, READ BACK BY AND VERIFIED WITH: PHARMD VERANDA BRICK AT [place] ON 03/11/2020 Performed at [facility], [place] [address], [place], [place] [address]    Culture ENTEROCOCCUS FAECALIS (A)  Final   Report Status 03/12/2020 FINAL  Final   Organism ID, Bacteria ENTEROCOCCUS FAECALIS  Final      Susceptibility   Enterococcus faecalis - MIC*    AMPICILLIN <=2 SENSITIVE Sensitive     VANCOMYCIN 1 SENSITIVE Sensitive     GENTAMICIN SYNERGY SENSITIVE Sensitive     * ENTEROCOCCUS FAECALIS  Blood Culture ID Panel (Reflexed)     Status: Abnormal   Collection Time: 03/10/20 11:12 AM  Result Value Ref Range Status   Enterococcus species DETECTED (A) NOT DETECTED Final    Comment: CRITICAL RESULT CALLED TO, READ BACK BY AND VERIFIED WITH: PHARMD VERANDA BRICK AT 0052 BY MESSAN HOUEGNIFIO ON 03/11/2020    Vancomycin resistance NOT DETECTED NOT DETECTED Final   Listeria monocytogenes NOT  DETECTED NOT DETECTED Final   Staphylococcus species NOT DETECTED NOT DETECTED Final   Staphylococcus aureus (BCID) NOT DETECTED NOT DETECTED Final   Streptococcus species NOT DETECTED NOT DETECTED Final   Streptococcus agalactiae NOT DETECTED NOT DETECTED Final   Streptococcus pneumoniae NOT DETECTED NOT DETECTED Final   Streptococcus pyogenes NOT DETECTED NOT DETECTED Final   Acinetobacter baumannii NOT DETECTED NOT DETECTED Final   Enterobacteriaceae species NOT DETECTED NOT DETECTED Final   Enterobacter cloacae complex NOT DETECTED NOT DETECTED Final   Escherichia coli NOT DETECTED NOT DETECTED Final   Klebsiella oxytoca NOT DETECTED NOT DETECTED Final   Klebsiella pneumoniae NOT DETECTED NOT DETECTED Final   Proteus species NOT DETECTED NOT DETECTED Final   Serratia marcescens NOT DETECTED NOT DETECTED Final   Haemophilus influenzae NOT DETECTED NOT DETECTED Final   Neisseria meningitidis NOT DETECTED NOT DETECTED Final   Pseudomonas aeruginosa NOT DETECTED NOT DETECTED Final   Candida albicans NOT DETECTED NOT DETECTED Final   Candida glabrata NOT DETECTED NOT DETECTED Final   Candida krusei NOT DETECTED NOT DETECTED Final   Candida parapsilosis NOT DETECTED NOT DETECTED Final   Candida tropicalis NOT DETECTED NOT DETECTED Final    Comment: Performed at [facility], [place] [address], [place], [place] [address]  Blood Culture (routine x 2)     Status: Abnormal   Collection Time: 03/10/20 11:35 AM   Specimen: BLOOD LEFT FOREARM  Result Value Ref Range Status   Specimen Description BLOOD LEFT FOREARM  Final   Special Requests   Final    BOTTLES DRAWN AEROBIC AND ANAEROBIC Blood Culture adequate volume   Culture  Setup Time   Final    GRAM POSITIVE COCCI IN CHAINS IN BOTH AEROBIC AND ANAEROBIC BOTTLES CRITICAL VALUE NOTED.  VALUE IS CONSISTENT WITH PREVIOUSLY REPORTED AND CALLED VALUE.    Culture (A)  Final    ENTEROCOCCUS FAECALIS SUSCEPTIBILITIES PERFORMED ON PREVIOUS  CULTURE WITHIN THE LAST 5 DAYS. Performed at [facility], [place] [address], [place], [place] [address]    Report Status 03/12/2020 FINAL  Final  MRSA PCR Screening     Status: Abnormal   Collection Time: 03/10/20  6:08 PM   Specimen: Nasopharyngeal  Result Value Ref Range Status   MRSA by PCR (A) NEGATIVE Final    INVALID, UNABLE TO DETERMINE THE PRESENCE OF TARGET DUE TO SPECIMEN INTEGRITY. RECOLLECTION REQUESTED.    Comment: A,KOTEY @2145  03/10/20 EB Performed at [facility] [address], [place], [place] [address]   MRSA PCR Screening     Status: None   Collection Time: 03/11/20  8:17 AM   Specimen: Nasal Mucosa; Nasopharyngeal  Result Value Ref Range Status   MRSA by PCR NEGATIVE NEGATIVE Final    Comment:        The GeneXpert MRSA Assay (FDA approved for NASAL specimens only), is one component of a comprehensive MRSA colonization surveillance program. It is not intended to diagnose MRSA infection nor to guide or monitor treatment for MRSA infections. Performed at [facility], [place] [address], [place], [place] [address]   Urine culture     Status: Abnormal   Collection Time: 03/11/20  9:17 AM   Specimen: In/Out Cath Urine  Result Value Ref Range Status   Specimen Description IN/OUT CATH URINE  Final   Special Requests   Final    NONE Performed at [facility], [place] [address] [place],  [address]  Culture MULTIPLE SPECIES PRESENT, SUGGEST RECOLLECTION (A)  Final   Report Status 03/12/2020 FINAL  Final  Culture, blood (routine x 2)     Status: None (Preliminary result)   Collection Time: 03/12/20 12:13 PM   Specimen: BLOOD LEFT HAND  Result Value Ref Range Status   Specimen Description BLOOD LEFT HAND  Final   Special Requests   Final    BOTTLES DRAWN AEROBIC ONLY Blood Culture results may not be optimal due to an inadequate volume of blood received in culture bottles   Culture   Final    NO GROWTH 4 DAYS Performed at [facility], [place] [address], [place], [place] [address]    Report Status PENDING  Incomplete  Culture, blood (routine x 2)     Status: None (Preliminary result)   Collection Time: 03/12/20  7:17 PM   Specimen: BLOOD LEFT HAND  Result Value Ref Range Status   Specimen Description BLOOD LEFT HAND  Final   Special Requests   Final    BOTTLES DRAWN AEROBIC ONLY Blood Culture results may not be optimal due to an inadequate volume of blood received in culture bottles   Culture   Final    NO GROWTH 4 DAYS Performed at [facility], [place] [address], [place], [place] [address]    Report Status PENDING  Incomplete  Culture, blood (Routine X 2) w Reflex to ID Panel     Status: None (Preliminary result)   Collection Time: 03/14/20  5:59 PM   Specimen: BLOOD LEFT HAND  Result Value Ref Range Status   Specimen Description BLOOD LEFT HAND  Final   Special Requests   Final    BOTTLES DRAWN AEROBIC AND ANAEROBIC Blood Culture adequate volume   Culture   Final    NO GROWTH 2 DAYS Performed at [facility], [place] [address] [place], [place] [address]    Report Status PENDING  Incomplete  Culture, blood (Routine X 2) w Reflex to ID Panel     Status: None (Preliminary result)   Collection Time: 03/14/20 10:09 PM   Specimen: BLOOD  Result Value Ref Range Status   Specimen Description BLOOD LEFT HAND  Final   Special Requests   Final    BOTTLES DRAWN AEROBIC ONLY Blood Culture adequate volume   Culture   Final    NO GROWTH 2 DAYS Performed at [facility], [place] [address] [place],  [address]    Report Status PENDING  Incomplete     Medications:   . ARIPiprazole  2 mg Oral Daily  . aspirin EC  81 mg Oral Q breakfast  . buPROPion  150 mg Oral Daily  . calcitRIOL  0.25 mcg Oral Q M,W,F  . calcium acetate  2,001 mg Oral TID WC  . Chlorhexidine Gluconate Cloth  6 each Topical Daily  . diltiazem  60 mg Oral Q6H  . feeding supplement (ENSURE ENLIVE)  237 mL Oral BID BM  .  insulin aspart  0-5 Units Subcutaneous QHS  . insulin aspart  0-6 Units Subcutaneous TID WC  . insulin aspart  2 Units Subcutaneous TID WC  . insulin detemir  20 Units Subcutaneous BID  . levETIRAcetam  250 mg Oral BID  . levothyroxine  50 mcg Oral QAC breakfast  . mouth rinse  15 mL Mouth Rinse BID  . metoprolol tartrate  50 mg Oral BID  . midodrine  5 mg Oral TID WC  .  montelukast  10 mg Oral QHS  . multivitamin  1 tablet Oral QHS  . pantoprazole  40 mg Oral Daily  . predniSONE  10 mg Oral Q breakfast  . primidone  50 mg Oral Daily  . sodium chloride flush  10-40 mL Intracatheter Q12H  . Thrombi-Pad  1 each Topical Once  . traZODone  50 mg Oral QHS   Continuous Infusions: . sodium chloride    . amiodarone 30 mg/hr (03/17/20 0601)  . ampicillin (OMNIPEN) IV 2 g (03/17/20 0320)  . phenylephrine (NEO-SYNEPHRINE) Adult infusion Stopped (03/12/20 1435)      LOS: 7 days   Charlynne Cousins  [facility]  03/17/2020, 9:24 AM

## 2020-03-17 NOTE — Progress Notes (Signed)
Pt resting comfortably at this time. Vitals stable, will continue to monitor as needed.

## 2020-03-17 NOTE — Progress Notes (Signed)
  Speech Language Pathology Treatment: Dysphagia  Patient Details Name: [name] MRN: [id number] DOB: 02/27/1951 Today's Date: 03/17/2020 Time: 3254-9826 SLP Time Calculation (min) (ACUTE ONLY): 11 min  Assessment / Plan / Recommendation Clinical Impression  Pt was seen for dysphagia treatment. She reported that she has been consuming the current diet without difficulty and indicated that she does not need the thickened liquids. She continues to inconsistently demonstrate coughing with thin liquids via cup and straw. Frequency of coughing was reduced with smaller cups sips but not eliminated. A repeat modified barium swallow study is recommended to further assess swallow function.   HPI HPI: 69 y.o. female, known to SLP services, with medical history significant for acute-on-chronic dysphagia, chronic hypoxemic respiratory failure secondary to underlying COPD-on 3 L of oxygen via nasal cannula, paroxysmal A. fib-on Eliquis, ESRD on hemodialysis (TTS), diastolic CHF, moderate AS, hypertension, hyperlipidemia, diabetes mellitus, obstructive sleep apnea/OHS, rheumatoid arthritis, seizure disorder, depression/anxiety, bipolar disorder presented to emergency department 7/26 with SOB and elevated blood glucose. Developed resp failure, transferred to ICU, on BIPAP, CRRT initiated; Dx sepsis secondary to RLL pna, afib with RVR, acute metabolic encephalopathy.  Pt has been followed by SLP during multiple admissions the last several years, with acute decompensations of swallow function.  She has had MBS studies, some of which showed deep penetration and/or aspiration of thin liquids, chin tuck being effective at one time.       SLP Plan  New goals to be determined pending instrumental study       Recommendations  Diet recommendations: Regular;Nectar-thick liquid Liquids provided via: Cup Medication Administration: Whole meds with puree Supervision: Staff to assist with self  feeding Compensations: Slow rate;Small sips/bites Postural Changes and/or Swallow Maneuvers: Seated upright 90 degrees                Oral Care Recommendations: Oral care BID Follow up Recommendations: Other (comment) (tba) SLP Visit Diagnosis: Dysphagia, oropharyngeal phase (R13.12) Plan: New goals to be determined pending instrumental study       [name] I. [name], [place], [name] Office number [phone] Pager [phone]                [name] 03/17/2020, 4:51 PM

## 2020-03-17 NOTE — Progress Notes (Signed)
Pt did not want Bipap. RR was 14 spo2 99 HR 104 on 3L [place].

## 2020-03-17 NOTE — Progress Notes (Signed)
Subjective: No new complaints   Antibiotics:  Anti-infectives (From admission, onward)   Start     Dose/Rate Route Frequency Ordered Stop   03/13/20 1030  ampicillin (OMNIPEN) 2 g in sodium chloride 0.9 % 100 mL IVPB     Discontinue     2 g 300 mL/hr over 20 Minutes Intravenous Every 12 hours 03/13/20 1007     03/13/20 0830  amoxicillin (AMOXIL) capsule 500 mg        500 mg Oral  Once 03/13/20 0748 03/13/20 0848   03/11/20 1200  ceFEPIme (MAXIPIME) 1 g in sodium chloride 0.9 % 100 mL IVPB  Status:  Discontinued        1 g 200 mL/hr over 30 Minutes Intravenous Every 24 hours 03/10/20 1141 03/10/20 1835   03/11/20 1200  vancomycin (VANCOREADY) IVPB 750 mg/150 mL  Status:  Discontinued        750 mg 150 mL/hr over 60 Minutes Intravenous Every 24 hours 03/10/20 1835 03/11/20 0921   03/11/20 1000  linezolid (ZYVOX) tablet 600 mg  Status:  Discontinued        600 mg Oral Every 12 hours 03/11/20 0921 03/13/20 1000   03/11/20 0000  vancomycin variable dose per unstable renal function (pharmacist dosing)  Status:  Discontinued         Does not apply See admin instructions 03/10/20 1141 03/10/20 1835   03/11/20 0000  ceFEPIme (MAXIPIME) 2 g in sodium chloride 0.9 % 100 mL IVPB  Status:  Discontinued        2 g 200 mL/hr over 30 Minutes Intravenous Every 12 hours 03/10/20 1835 03/11/20 0921   03/10/20 1145  ceFEPIme (MAXIPIME) 2 g in sodium chloride 0.9 % 100 mL IVPB        2 g 200 mL/hr over 30 Minutes Intravenous  Once 03/10/20 1133 03/10/20 1217   03/10/20 1145  vancomycin (VANCOREADY) IVPB 2000 mg/400 mL        2,000 mg 200 mL/hr over 120 Minutes Intravenous  Once 03/10/20 1137 03/10/20 1731   03/10/20 1115  aztreonam (AZACTAM) 2 g in sodium chloride 0.9 % 100 mL IVPB  Status:  Discontinued        2 g 200 mL/hr over 30 Minutes Intravenous  Once 03/10/20 1108 03/10/20 1133   03/10/20 1115  metroNIDAZOLE (FLAGYL) IVPB 500 mg        500 mg 100 mL/hr over 60 Minutes Intravenous   Once 03/10/20 1108 03/10/20 1356   03/10/20 1115  vancomycin (VANCOCIN) IVPB 1000 mg/200 mL premix  Status:  Discontinued        1,000 mg 200 mL/hr over 60 Minutes Intravenous  Once 03/10/20 1108 03/10/20 1137      Medications: Scheduled Meds: . amiodarone  200 mg Oral BID  . ARIPiprazole  2 mg Oral Daily  . aspirin EC  81 mg Oral Q breakfast  . buPROPion  150 mg Oral Daily  . calcitRIOL      . calcitRIOL  0.25 mcg Oral Q M,W,F  . calcium acetate  2,001 mg Oral TID WC  . Chlorhexidine Gluconate Cloth  6 each Topical Daily  . diltiazem  90 mg Oral Q6H  . feeding supplement (ENSURE ENLIVE)  237 mL Oral BID BM  . HYDROcodone-acetaminophen      . insulin aspart  0-5 Units Subcutaneous QHS  . insulin aspart  0-6 Units Subcutaneous TID WC  . insulin aspart  2 Units Subcutaneous TID  WC  . insulin detemir  30 Units Subcutaneous BID  . levETIRAcetam  250 mg Oral BID  . levothyroxine  50 mcg Oral QAC breakfast  . mouth rinse  15 mL Mouth Rinse BID  . metoprolol tartrate  100 mg Oral BID  . midodrine  5 mg Oral TID WC  . montelukast  10 mg Oral QHS  . multivitamin  1 tablet Oral QHS  . pantoprazole  40 mg Oral Daily  . predniSONE  10 mg Oral Q breakfast  . primidone  50 mg Oral Daily  . sodium chloride flush  10-40 mL Intracatheter Q12H  . Thrombi-Pad  1 each Topical Once  . traZODone  50 mg Oral QHS   Continuous Infusions: . sodium chloride 250 mL (03/17/20 1309)  . ampicillin (OMNIPEN) IV 2 g (03/17/20 1310)  . phenylephrine (NEO-SYNEPHRINE) Adult infusion Stopped (03/12/20 1435)   PRN Meds:.acetaminophen, albuterol, calcium carbonate, diphenhydrAMINE, docusate sodium, EPINEPHrine, heparin, HYDROcodone-acetaminophen, metoprolol tartrate, nitroGLYCERIN, ondansetron (ZOFRAN) IV, Resource ThickenUp Clear, sodium chloride flush    Objective: Weight change: -0.8 kg  Intake/Output Summary (Last 24 hours) at 03/17/2020 1534 Last data filed at 03/17/2020 1500 Gross per 24 hour    Intake 643.38 ml  Output 2000 ml  Net -1356.62 ml   Blood pressure 134/76, pulse (!) 110, temperature 98.2 F (36.8 C), temperature source Oral, resp. rate 18, height 5\' 2"  (1.575 m), weight 87.3 kg, SpO2 100 %. Temp:  [97.9 F (36.6 C)-98.6 F (37 C)] 98.2 F (36.8 C) (08/02 1203) Pulse Rate:  [96-115] 110 (08/02 1130) Resp:  [13-25] 18 (08/02 1203) BP: (109-142)/(74-100) 134/76 (08/02 1203) SpO2:  [96 %-100 %] 100 % (08/02 1203) Weight:  [87.2 kg-89.3 kg] 87.3 kg (08/02 1113)  Physical Exam: General: Alert and awake, lying in bed HEENT: anicteric sclera, EOMI CVS irregularly irregular rate with tachycardia and atrial fib with RVR Chest: , no wheezing, no respiratory distress Abdomen: soft non-distended,  Skin: Ecchymoses, central line out  CBC:    BMET Recent Labs    03/16/20 0535 03/17/20 0328  NA 136 138  K 4.0 3.6  CL 98 99  CO2 25 26  GLUCOSE 394* 151*  BUN 62* 73*  CREATININE 4.06* 4.78*  CALCIUM 9.0 9.4     Liver Panel  Recent Labs    03/14/20 1812 03/14/20 1812 03/15/20 0500 03/15/20 0500 03/16/20 0535 03/17/20 0328  PROT 4.9*  --  4.8*  --   --   --   ALBUMIN 2.8*   < > 2.6*   < > 2.8* 2.7*  AST 366*  --  181*  --   --   --   ALT 1,393*  --  1,118*  --   --   --   ALKPHOS 145*  --  129*  --   --   --   BILITOT 1.6*  --  1.2  --   --   --   BILIDIR 0.4*  --   --   --   --   --   IBILI 1.2*  --   --   --   --   --    < > = values in this interval not displayed.       Sedimentation Rate No results for input(s): ESRSEDRATE in the last 72 hours. C-Reactive Protein No results for input(s): CRP in the last 72 hours.  Micro Results: Recent Results (from the past 720 hour(s))  SARS Coronavirus 2 by RT PCR (hospital order,  performed in [facility] hospital lab) Nasopharyngeal Nasopharyngeal Swab     Status: None   Collection Time: 02/20/20  6:59 AM   Specimen: Nasopharyngeal Swab  Result Value Ref Range Status   SARS Coronavirus 2  NEGATIVE NEGATIVE Final    Comment: (NOTE) SARS-CoV-2 target nucleic acids are NOT DETECTED.  The SARS-CoV-2 RNA is generally detectable in upper and lower respiratory specimens during the acute phase of infection. The lowest concentration of SARS-CoV-2 viral copies this assay can detect is 250 copies / mL. A negative result does not preclude SARS-CoV-2 infection and should not be used as the sole basis for treatment or other patient management decisions.  A negative result may occur with improper specimen collection / handling, submission of specimen other than nasopharyngeal swab, presence of viral mutation(s) within the areas targeted by this assay, and inadequate number of viral copies (<250 copies / mL). A negative result must be combined with clinical observations, patient history, and epidemiological information.  Fact Sheet for Patients:   StrictlyIdeas.no  Fact Sheet for Healthcare Providers: BankingDealers.co.za  This test is not yet approved or  cleared by the [place] FDA and has been authorized for detection and/or diagnosis of SARS-CoV-2 by FDA under an Emergency Use Authorization (EUA).  This EUA will remain in effect (meaning this test can be used) for the duration of the COVID-19 declaration under Section 564(b)(1) of the Act, 21 U.S.C. section 360bbb-3(b)(1), unless the authorization is terminated or revoked sooner.  Performed at [facility], [place] [address] [place], [place] [address]   SARS Coronavirus 2 by RT PCR (hospital order, performed in [facility] hospital lab) Nasopharyngeal Nasopharyngeal Swab     Status: None   Collection Time: 03/07/20  1:13 AM   Specimen: Nasopharyngeal Swab  Result Value Ref Range Status   SARS Coronavirus 2 NEGATIVE NEGATIVE Final    Comment: (NOTE) SARS-CoV-2 target nucleic acids are NOT DETECTED.  The SARS-CoV-2 RNA is generally detectable in upper and  lower respiratory specimens during the acute phase of infection. The lowest concentration of SARS-CoV-2 viral copies this assay can detect is 250 copies / mL. A negative result does not preclude SARS-CoV-2 infection and should not be used as the sole basis for treatment or other patient management decisions.  A negative result may occur with improper specimen collection / handling, submission of specimen other than nasopharyngeal swab, presence of viral mutation(s) within the areas targeted by this assay, and inadequate number of viral copies (<250 copies / mL). A negative result must be combined with clinical observations, patient history, and epidemiological information.  Fact Sheet for Patients:   StrictlyIdeas.no  Fact Sheet for Healthcare Providers: BankingDealers.co.za  This test is not yet approved or  cleared by the [place] FDA and has been authorized for detection and/or diagnosis of SARS-CoV-2 by FDA under an Emergency Use Authorization (EUA).  This EUA will remain in effect (meaning this test can be used) for the duration of the COVID-19 declaration under Section 564(b)(1) of the Act, 21 U.S.C. section 360bbb-3(b)(1), unless the authorization is terminated or revoked sooner.  Performed at [facility], [address] [place], [place] [address]   SARS Coronavirus 2 by RT PCR (hospital order, performed in [facility] hospital lab) Nasopharyngeal Nasopharyngeal Swab     Status: None   Collection Time: 03/10/20 10:32 AM   Specimen: Nasopharyngeal Swab  Result Value Ref Range Status   SARS Coronavirus 2 NEGATIVE NEGATIVE Final    Comment: (NOTE)  SARS-CoV-2 target nucleic acids are NOT DETECTED.  The SARS-CoV-2 RNA is generally detectable in upper and lower respiratory specimens during the acute phase of infection. The lowest concentration of SARS-CoV-2 viral copies this assay can detect is 250 copies / mL. A negative  result does not preclude SARS-CoV-2 infection and should not be used as the sole basis for treatment or other patient management decisions.  A negative result may occur with improper specimen collection / handling, submission of specimen other than nasopharyngeal swab, presence of viral mutation(s) within the areas targeted by this assay, and inadequate number of viral copies (<250 copies / mL). A negative result must be combined with clinical observations, patient history, and epidemiological information.  Fact Sheet for Patients:   StrictlyIdeas.no  Fact Sheet for Healthcare Providers: BankingDealers.co.za  This test is not yet approved or  cleared by the [place] FDA and has been authorized for detection and/or diagnosis of SARS-CoV-2 by FDA under an Emergency Use Authorization (EUA).  This EUA will remain in effect (meaning this test can be used) for the duration of the COVID-19 declaration under Section 564(b)(1) of the Act, 21 U.S.C. section 360bbb-3(b)(1), unless the authorization is terminated or revoked sooner.  Performed at [facility], [place] [address], [place], [place] [address]   Blood Culture (routine x 2)     Status: Abnormal   Collection Time: 03/10/20 11:12 AM   Specimen: BLOOD  Result Value Ref Range Status   Specimen Description BLOOD LEFT ANTECUBITAL  Final   Special Requests   Final    BOTTLES DRAWN AEROBIC AND ANAEROBIC Blood Culture adequate volume   Culture  Setup Time   Final    GRAM POSITIVE COCCI IN CHAINS IN BOTH AEROBIC AND ANAEROBIC BOTTLES Organism ID to follow CRITICAL RESULT CALLED TO, READ BACK BY AND VERIFIED WITH: PHARMD VERANDA BRICK AT [place] ON 03/11/2020 Performed at [facility], [place] [address] [place], [place] [address]    Culture ENTEROCOCCUS FAECALIS (A)  Final   Report Status 03/12/2020 FINAL  Final   Organism ID, Bacteria ENTEROCOCCUS FAECALIS   Final      Susceptibility   Enterococcus faecalis - MIC*    AMPICILLIN <=2 SENSITIVE Sensitive     VANCOMYCIN 1 SENSITIVE Sensitive     GENTAMICIN SYNERGY SENSITIVE Sensitive     * ENTEROCOCCUS FAECALIS  Blood Culture ID Panel (Reflexed)     Status: Abnormal   Collection Time: 03/10/20 11:12 AM  Result Value Ref Range Status   Enterococcus species DETECTED (A) NOT DETECTED Final    Comment: CRITICAL RESULT CALLED TO, READ BACK BY AND VERIFIED WITH: PHARMD VERANDA BRICK AT 0052 BY MESSAN HOUEGNIFIO ON 03/11/2020    Vancomycin resistance NOT DETECTED NOT DETECTED Final   Listeria monocytogenes NOT DETECTED NOT DETECTED Final   Staphylococcus species NOT DETECTED NOT DETECTED Final   Staphylococcus aureus (BCID) NOT DETECTED NOT DETECTED Final   Streptococcus species NOT DETECTED NOT DETECTED Final   Streptococcus agalactiae NOT DETECTED NOT DETECTED Final   Streptococcus pneumoniae NOT DETECTED NOT DETECTED Final   Streptococcus pyogenes NOT DETECTED NOT DETECTED Final   Acinetobacter baumannii NOT DETECTED NOT DETECTED Final   Enterobacteriaceae species NOT DETECTED NOT DETECTED Final   Enterobacter cloacae complex NOT DETECTED NOT DETECTED Final   Escherichia coli NOT DETECTED NOT DETECTED Final   Klebsiella oxytoca NOT DETECTED NOT DETECTED Final   Klebsiella pneumoniae NOT DETECTED NOT DETECTED Final   Proteus species  NOT DETECTED NOT DETECTED Final   Serratia marcescens NOT DETECTED NOT DETECTED Final   Haemophilus influenzae NOT DETECTED NOT DETECTED Final   Neisseria meningitidis NOT DETECTED NOT DETECTED Final   Pseudomonas aeruginosa NOT DETECTED NOT DETECTED Final   Candida albicans NOT DETECTED NOT DETECTED Final   Candida glabrata NOT DETECTED NOT DETECTED Final   Candida krusei NOT DETECTED NOT DETECTED Final   Candida parapsilosis NOT DETECTED NOT DETECTED Final   Candida tropicalis NOT DETECTED NOT DETECTED Final    Comment: Performed at [facility], [place] [address] [place], [place] [address]  Blood Culture (routine x 2)     Status: Abnormal   Collection Time: 03/10/20 11:35 AM   Specimen: BLOOD LEFT FOREARM  Result Value Ref Range Status   Specimen Description BLOOD LEFT FOREARM  Final   Special Requests   Final    BOTTLES DRAWN AEROBIC AND ANAEROBIC Blood Culture adequate volume   Culture  Setup Time   Final    GRAM POSITIVE COCCI IN CHAINS IN BOTH AEROBIC AND ANAEROBIC BOTTLES CRITICAL VALUE NOTED.  VALUE IS CONSISTENT WITH PREVIOUSLY REPORTED AND CALLED VALUE.    Culture (A)  Final    ENTEROCOCCUS FAECALIS SUSCEPTIBILITIES PERFORMED ON PREVIOUS CULTURE WITHIN THE LAST 5 DAYS. Performed at [facility], [place] [address], [place], [place] [address]    Report Status 03/12/2020 FINAL  Final  MRSA PCR Screening     Status: Abnormal   Collection Time: 03/10/20  6:08 PM   Specimen: Nasopharyngeal  Result Value Ref Range Status   MRSA by PCR (A) NEGATIVE Final    INVALID, UNABLE TO DETERMINE THE PRESENCE OF TARGET DUE TO SPECIMEN INTEGRITY. RECOLLECTION REQUESTED.    Comment: A,KOTEY @2145  03/10/20 EB Performed at [place] [address], [place], [place] [address]   MRSA PCR Screening     Status: None   Collection Time: 03/11/20  8:17 AM   Specimen: Nasal Mucosa; Nasopharyngeal  Result Value Ref Range Status   MRSA by PCR NEGATIVE NEGATIVE Final    Comment:        The GeneXpert MRSA Assay (FDA approved for NASAL specimens only), is one component of a comprehensive MRSA colonization surveillance program. It is not intended to diagnose MRSA infection nor to guide or monitor treatment for MRSA infections. Performed at [facility], [place] [address], [place], [place] [address]   Urine culture     Status: Abnormal   Collection Time: 03/11/20  9:17 AM   Specimen: In/Out Cath Urine  Result Value Ref Range Status   Specimen Description IN/OUT CATH URINE  Final   Special Requests   Final    NONE Performed at [facility], [place] [address] [place], [place] [address]    Culture MULTIPLE SPECIES PRESENT, SUGGEST RECOLLECTION (A)  Final   Report Status 03/12/2020 FINAL  Final  Culture, blood (routine x 2)     Status: None   Collection Time: 03/12/20 12:13 PM   Specimen: BLOOD LEFT HAND  Result Value Ref Range Status   Specimen Description BLOOD LEFT HAND  Final   Special Requests   Final    BOTTLES DRAWN AEROBIC ONLY Blood Culture results may not be optimal due to an inadequate volume of blood received in culture bottles   Culture   Final    NO GROWTH 5 DAYS Performed at [facility], [place] [address] [place] [address], [place], [place] [address]  Report Status 03/17/2020 FINAL  Final  Culture, blood (routine x 2)     Status: None   Collection Time: 03/12/20  7:17 PM   Specimen: BLOOD LEFT HAND  Result Value Ref Range Status   Specimen Description BLOOD LEFT HAND  Final   Special Requests   Final    BOTTLES DRAWN AEROBIC ONLY Blood Culture results may not be optimal due to an inadequate volume of blood received in culture bottles   Culture   Final    NO GROWTH 5 DAYS Performed at [facility], [place] [address], [place], [place] [address]    Report Status 03/17/2020 FINAL  Final  Culture, blood (Routine X 2) w Reflex to ID Panel     Status: None (Preliminary result)   Collection Time: 03/14/20  5:59 PM   Specimen: BLOOD LEFT HAND  Result Value Ref Range Status   Specimen Description BLOOD LEFT HAND  Final   Special Requests   Final    BOTTLES DRAWN AEROBIC AND ANAEROBIC Blood Culture adequate volume   Culture   Final    NO GROWTH 3 DAYS Performed at [facility], [place] [address] [place], [place] [address]    Report Status PENDING  Incomplete  Culture, blood (Routine X 2) w Reflex to ID Panel     Status: None (Preliminary result)   Collection Time: 03/14/20 10:09 PM   Specimen: BLOOD  Result Value Ref Range Status   Specimen Description BLOOD LEFT HAND  Final   Special Requests    Final    BOTTLES DRAWN AEROBIC ONLY Blood Culture adequate volume   Culture   Final    NO GROWTH 3 DAYS Performed at [facility], [place] [address], [place], [place] [address]    Report Status PENDING  Incomplete    Studies/Results: No results found.    Assessment/Plan:  INTERVAL HISTORY: central line is out   Principal Problem:   Bacteremia Active Problems:   Type 2 diabetes mellitus with diabetic polyneuropathy, without long-term current use of insulin (HCC)   Diabetic peripheral neuropathy (HCC)   HYPERCHOLESTEROLEMIA   Gout, unspecified   Schizophrenia ([place])   Essential hypertension   Acute on chronic diastolic CHF (congestive heart failure) (HCC)   Hypothyroidism   ESRD (end stage renal disease) (HCC)   Elevated troponin   Acute on chronic respiratory failure with hypoxia (HCC)   Atrial fibrillation with RVR (HCC)   Macrocytic anemia   Thrombocytopenia (HCC)   Hyperkalemia   Septic shock (HCC)   Elevated liver enzymes   Palliative care by specialist    [name] is a 69 y.o. female with history of end-stage renal disease on hemodialysis, COPD admitted with sepsis and found to have Enterococcus faecalis bacteremia. He was critically ill on pressors and CRRT. Sepsis has resolved and hemodialysis catheter is out. She now is struggling atrial fibrillation with a rapid ventricular response.  1. Enterococcal bacteremia  Central line is out!  I will order blood cultures to ensure she has cleared bacteremia  She also needs a transesophageal echocardiogram when safe to do so   LOS: 7 days   Alcide Evener 03/17/2020, 3:34 PM

## 2020-03-18 ENCOUNTER — Inpatient Hospital Stay (HOSPITAL_COMMUNITY): Payer: Medicare Other

## 2020-03-18 DIAGNOSIS — R0602 Shortness of breath: Secondary | ICD-10-CM | POA: Diagnosis not present

## 2020-03-18 DIAGNOSIS — A4181 Sepsis due to Enterococcus: Secondary | ICD-10-CM | POA: Diagnosis not present

## 2020-03-18 DIAGNOSIS — I4891 Unspecified atrial fibrillation: Secondary | ICD-10-CM | POA: Diagnosis not present

## 2020-03-18 LAB — GLUCOSE, CAPILLARY
Glucose-Capillary: 105 mg/dL — ABNORMAL HIGH (ref 70–99)
Glucose-Capillary: 105 mg/dL — ABNORMAL HIGH (ref 70–99)
Glucose-Capillary: 158 mg/dL — ABNORMAL HIGH (ref 70–99)
Glucose-Capillary: 202 mg/dL — ABNORMAL HIGH (ref 70–99)
Glucose-Capillary: 222 mg/dL — ABNORMAL HIGH (ref 70–99)

## 2020-03-18 LAB — BLOOD CULTURE ID PANEL (REFLEXED) - BCID2

## 2020-03-18 LAB — CBC
HCT: 30.6 % — ABNORMAL LOW (ref 36.0–46.0)
Hemoglobin: 9.6 g/dL — ABNORMAL LOW (ref 12.0–15.0)
MCH: 35.4 pg — ABNORMAL HIGH (ref 26.0–34.0)
MCHC: 31.4 g/dL (ref 30.0–36.0)
MCV: 112.9 fL — ABNORMAL HIGH (ref 80.0–100.0)
Platelets: 95 10*3/uL — ABNORMAL LOW (ref 150–400)
RBC: 2.71 MIL/uL — ABNORMAL LOW (ref 3.87–5.11)
RDW: 15.9 % — ABNORMAL HIGH (ref 11.5–15.5)
WBC: 12.9 10*3/uL — ABNORMAL HIGH (ref 4.0–10.5)
nRBC: 0.2 % (ref 0.0–0.2)

## 2020-03-18 LAB — RENAL FUNCTION PANEL
Albumin: 2.7 g/dL — ABNORMAL LOW (ref 3.5–5.0)
Anion gap: 11 (ref 5–15)
BUN: 35 mg/dL — ABNORMAL HIGH (ref 8–23)
CO2: 29 mmol/L (ref 22–32)
Calcium: 9.2 mg/dL (ref 8.9–10.3)
Chloride: 98 mmol/L (ref 98–111)
Creatinine, Ser: 3.28 mg/dL — ABNORMAL HIGH (ref 0.44–1.00)
GFR calc Af Amer: 16 mL/min — ABNORMAL LOW (ref >60)
GFR calc non Af Amer: 14 mL/min — ABNORMAL LOW (ref >60)
Glucose, Bld: 69 mg/dL — ABNORMAL LOW (ref 70–99)
Phosphorus: 2.6 mg/dL (ref 2.5–4.6)
Potassium: 4.1 mmol/L (ref 3.5–5.1)
Sodium: 138 mmol/L (ref 135–145)

## 2020-03-18 LAB — HEPATIC FUNCTION PANEL
ALT: 487 U/L — ABNORMAL HIGH (ref 0–44)
AST: 38 U/L (ref 15–41)
Albumin: 2.8 g/dL — ABNORMAL LOW (ref 3.5–5.0)
Alkaline Phosphatase: 117 U/L (ref 38–126)
Bilirubin, Direct: 0.2 mg/dL (ref 0.0–0.2)
Indirect Bilirubin: 0.6 mg/dL (ref 0.3–0.9)
Total Bilirubin: 0.8 mg/dL (ref 0.3–1.2)
Total Protein: 5 g/dL — ABNORMAL LOW (ref 6.5–8.1)

## 2020-03-18 MED ORDER — IOHEXOL 300 MG/ML  SOLN
100.0000 mL | Freq: Once | INTRAMUSCULAR | Status: AC | PRN
  Administered 2020-03-18: 100 mL via INTRAVENOUS

## 2020-03-18 MED ORDER — NEPRO/CARBSTEADY PO LIQD
237.0000 mL | Freq: Two times a day (BID) | ORAL | Status: DC
  Administered 2020-03-18 – 2020-03-20 (×4): 237 mL via ORAL

## 2020-03-18 MED ORDER — INSULIN ASPART 100 UNIT/ML ~~LOC~~ SOLN
4.0000 [IU] | Freq: Three times a day (TID) | SUBCUTANEOUS | Status: DC
  Administered 2020-03-18 – 2020-03-25 (×9): 4 [IU] via SUBCUTANEOUS

## 2020-03-18 MED ORDER — INSULIN DETEMIR 100 UNIT/ML ~~LOC~~ SOLN
28.0000 [IU] | Freq: Two times a day (BID) | SUBCUTANEOUS | Status: DC
  Administered 2020-03-18 – 2020-03-20 (×4): 28 [IU] via SUBCUTANEOUS
  Filled 2020-03-18 (×7): qty 0.28

## 2020-03-18 MED ORDER — SODIUM CHLORIDE 0.9 % IV SOLN
500.0000 mg | Freq: Two times a day (BID) | INTRAVENOUS | Status: DC
  Administered 2020-03-18 – 2020-03-24 (×14): 500 mg via INTRAVENOUS
  Filled 2020-03-18 (×16): qty 500

## 2020-03-18 MED ORDER — IOHEXOL 9 MG/ML PO SOLN
500.0000 mL | ORAL | Status: AC
  Administered 2020-03-18 (×2): 500 mL via ORAL

## 2020-03-18 NOTE — Progress Notes (Signed)
[facility] PROGRESS NOTE    Progress Note  [name]  TDD:[id number] DOB: 1950/11/27 DOA: 03/10/2020 PCP: Glenda Chroman, MD     Brief Narrative:   [name] is an 69 y.o. female past medical history of chronic respiratory failure with hypoxia secondary to underlying COPD on 3 L of oxygen, A. fib on Eliquis end-stage renal disease on hemodialysis chronic diastolic heart failure moderate AS, who came into the hospital for hyperglycemia and tachycardia was found in A. fib with RVR, decompensated systolic heart failure and septic shock who presented to the ED with atrial fibrillation and RVR with a heart rate of 170 was started on a diltiazem drip on arrival to the ED heart rate improved however she developed progressive hypotension and was transitioned to amiodarone due to hypotension PCCM was consulted and started on pressors for septic shock due to right lower lobe pneumonia.  03/10/2020 blood cultures grew Enterococcus faecalis, repeated surveillance blood cultures remain negative. 03/11/2020-7.29.2021 V linezolid 7.29.2021 ampicillin. 03/13/2020.IR successfully remove her tunneled hemodialysis catheter  03/11/2020 2D echo that showed an EF of 45% with mildly reduced RV function 03/10/2020 Initiated on CRRT 03/10/2020 Off pressors on  7.30/2021Started on hemodialysis Assessment/Plan:   Septic shock due to Enterococcus faecalis probably due to her HD catheter: ID was consulted he was sensitive to Vanco, then switch to Zyvox, then ampicillin now on imipenem due to blood cultures growing ESBL E. coli. We will need a TEE to rule out vegetation. Central line discontinued on 03/16/2020. Tunnel catheter successfully removed on 03/13/2020. Repeat blood cultures on 03/12/2020 for Enterococcus. Repeated blood cultures on 03/17/2020 show ESBL E. coli, her antibiotic regimen has been changed to IV imipenem. Reviewed ID's assistance further management per ID. Cardiology on board is on  board  to schedule TEE to rule out vegetation, it will determine the length of the antibiotic regimen.  Bleeding subclavian line: She is transfused 1 unit of platelets and 2 units of fresh frozen plasma, Eliquis was held, INR has improved to 1.7 a..  Platelet count is greater than 50,000 artificially after 1 unit of platelets, platelet count continue to improve. Continue to hold anticoagulation. Bleeding has resolved. Cardiology to dictate when to start anticoagulation.  Persistent atrial fibrillation with RVR: Probably driven by her infectious bacteremia. Due to heart control heart rate EP was consulted recommended to continue amiodarone, metoprolol and diltiazem Radiology is on board and appreciate assistance she is currently on diltiazem 90, metoprolol 100 and amiodarone 200mg .  With increase of metoprolol and diltiazem her heart rate has been now being controlled less than 100.  Her QTC is 534. Further management per cardiology.  Acute on chronic respiratory failure with hypoxia acute decompensated diastolic heart failure: Her volume is being managed by nephrology.  Acute metabolic/toxic encephalopathy: Her ammonia is level currently on lactulose.  Neurology on board. Neurology recommended to continue home primidone and Keppra. Encephalopathy is resolved. Multifactorial in the setting of Keppra infectious etiology.  End-stage renal disease: On hemodialysis Monday Tuesday and Thursday further management of her volume status per renal. For dialysis on 03/14/2020 per renal, we consulted IR to remove HD catheter on 03/13/2020  Transaminitis: Likely due to shock liver it is continued to worsen despite correcting hypotension. Right upper quadrant ultrasound showed no obstructive jaundice. Transaminases are improving.  Elevated ammonia level: Continue lactulose.  Insulin-dependent diabetes mellitus type 2: Blood glucose continues to be significantly elevated increase long-acting  insulin continue sliding scale. Hemoglobin A1c was 8.0.  Elevated  troponins: Likely due to demand ischemia, further management per cardiology. Titrating metoprolol up to control heart rate.  Hypothyroidism: Continue Synthroid.  Rheumatoid arthritis: On chronic prednisone continue current regimen.  Gout, unspecified  Schizophrenia ([place])  Acute Thrombocytopenia ([place]): Unlikely to be HIT, likely due to infectious etiology plus or minus uremia. Platelet is trending up slowly.  Hyperkalemia Per renal.  Now improved after dialysis.  Goals of care: It would be a good idea to get hospice imperative care involved discussed goals of care.  Stage II ulcer present on admission: RN Pressure Injury Documentation: Pressure Injury 03/10/20 Heel Left Unstageable - Full thickness tissue loss in which the base of the injury is covered by slough (yellow, tan, gray, green or brown) and/or eschar (tan, brown or black) in the wound bed. 3 cm x 2 cm (Active)  03/10/20 1730  Location: Heel  Location Orientation: Left  Staging: Unstageable - Full thickness tissue loss in which the base of the injury is covered by slough (yellow, tan, gray, green or brown) and/or eschar (tan, brown or black) in the wound bed.  Wound Description (Comments): 3 cm x 2 cm  Present on Admission: Yes    Estimated body mass index is 35.6 kg/m as calculated from the following:   Height as of this encounter: 5\' 2"  (1.575 m).   Weight as of this encounter: 88.3 kg. Malnutrition Type:  Nutrition Problem: Increased nutrient needs Etiology: acute illness   Malnutrition Characteristics:  Signs/Symptoms: estimated needs   Nutrition Interventions:  Interventions: Liberalize Diet, Magic cup, MVI, Ensure Enlive (each supplement provides 350kcal and 20 grams of protein)    DVT prophylaxis: none Family Communication:none Status is: Inpatient  Remains inpatient appropriate because:Hemodynamically unstable   Dispo:  The patient is from: Home              Anticipated d/c is to: Home              Anticipated d/c date is: > 3 days              Patient currently is not medically stable to d/c.   Code Status:     Code Status Orders  (From admission, onward)         Start     Ordered   03/10/20 1229  Full code  Continuous        03/10/20 1229        Code Status History    Date Active Date Inactive Code Status Order ID Comments User Context   01/23/2020 1928 01/26/2020 0003 Full Code [id number]  Vashti Hey, MD ED   11/22/2019 2141 11/27/2019 2110 Full Code [id number]  Bethena Roys, MD Inpatient   09/06/2019 0822 10/01/2019 2342 Full Code [id number]  Roxan Hockey, MD ED   11/28/2018 1635 11/29/2018 1904 Full Code [id number]  Murlean Iba, MD Inpatient   09/15/2018 1138 09/19/2018 1837 Full Code [id number]  Heath Lark D, DO ED   09/13/2018 0154 09/15/2018 1133 Full Code [id number]  Ripley Fraise, MD ED   08/16/2018 0603 08/20/2018 1453 Full Code [id number]  Aldean Jewett, MD Inpatient   07/27/2018 2032 08/01/2018 1538 Full Code [id number]  Arnell Asal, NP ED   07/14/2018 2209 07/15/2018 1634 Full Code [id number]  Vianne Bulls, MD ED   04/19/2018 0144 04/21/2018 1754 Full Code [id number]  Reubin Milan, MD ED   01/12/2018 1428 01/14/2018 0135 Full Code [id number]  Lorella Nimrod, MD ED   10/12/2016 [phone] 10/13/2016  Luis M. Cintron Full Code [id number]  Edwin Dada, MD Inpatient   08/12/2016 0128 08/13/2016 1809 Full Code [id number]  Lily Kocher, MD Inpatient   11/11/2015 0032 11/12/2015 1751 Full Code [id number]  Bethena Roys, MD Inpatient   Advance Care Planning Activity        IV Access:    Peripheral IV   Procedures and diagnostic studies:   No results found.   Medical Consultants:    None.  Anti-Infectives:   Ampicillin  Subjective:    Lezlie Octave she is tolerating her diet she relates he is feeling better.  Objective:    Vitals:    03/17/20 1203 03/17/20 1708 03/17/20 1943 03/18/20 0400  BP: 134/76 125/80 123/75 (!) 113/54  Pulse:   93 77  Resp: 18  20 20   Temp: 98.2 F (36.8 C) 98.5 F (36.9 C) 98.8 F (37.1 C) 98.6 F (37 C)  TempSrc: Oral Oral Oral Oral  SpO2: 100% 98% 100% 99%  Weight:    88.3 kg  Height:       SpO2: 99 % O2 Flow Rate (L/min): 3 L/min FiO2 (%): 40 %   Intake/Output Summary (Last 24 hours) at 03/18/2020 0936 Last data filed at 03/18/2020 0900 Gross per 24 hour  Intake 1187.06 ml  Output 2000 ml  Net -812.94 ml   Filed Weights   03/17/20 0703 03/17/20 1113 03/18/20 0400  Weight: 89.3 kg 87.3 kg 88.3 kg    Exam: General exam: In no acute distress. Respiratory system: Good air movement and clear to auscultation. Cardiovascular system: S1 & S2 heard, RRR. No JVD. Gastrointestinal system: Abdomen is nondistended, soft and nontender.  Extremities: No pedal edema. Skin: No rashes, lesions or ulcers  Data Reviewed:    Labs: Basic Metabolic Panel: Recent Labs  Lab 03/12/20 0300 03/12/20 0301 03/14/20 0415 03/14/20 0415 03/15/20 0500 03/15/20 0500 03/16/20 0535 03/16/20 0535 03/17/20 0328 03/18/20 0727  NA  --    < > 140  --  138  --  136  --  138 138  K  --    < > 3.9   < > 3.9   < > 4.0   < > 3.6 4.1  CL  --    < > 103  --  100  --  98  --  99 98  CO2  --    < > 25  --  25  --  25  --  26 29  GLUCOSE  --    < > 213*  --  326*  --  394*  --  151* 69*  BUN  --    < > 49*  --  38*  --  62*  --  73* 35*  CREATININE  --    < > 3.18*  --  2.99*  --  4.06*  --  4.78* 3.28*  CALCIUM  --    < > 8.9  --  8.6*  --  9.0  --  9.4 9.2  MG 2.5*  --   --   --   --   --   --   --   --   --   PHOS  --    < > 3.7  --  3.7  --  3.2  --  2.6 2.6   < > = values in this interval not displayed.   GFR Estimated Creatinine Clearance: 16.9 mL/min (A) (by C-G formula based on SCr of 3.28 mg/dL (H)). Liver  Function Tests: Recent Labs  Lab 03/13/20 0214 03/14/20 0415 03/14/20 1812  03/15/20 0500 03/16/20 0535 03/17/20 0328 03/18/20 0727  AST 1,563*  --  366* 181*  --   --  38  ALT 2,005*  --  1,393* 1,118*  --   --  487*  ALKPHOS 140*  --  145* 129*  --   --  117  BILITOT 1.4*  --  1.6* 1.2  --   --  0.8  PROT 5.2*  --  4.9* 4.8*  --   --  5.0*  ALBUMIN 3.0*  3.0*   < > 2.8* 2.6* 2.8* 2.7* 2.8*  2.7*   < > = values in this interval not displayed.   No results for input(s): LIPASE, AMYLASE in the last 168 hours. Recent Labs  Lab 03/13/20 0352  AMMONIA 50*   Coagulation profile Recent Labs  Lab 03/12/20 1058 03/13/20 0857 03/14/20 0415  INR 2.9* 2.0* 1.7*   COVID-19 Labs  No results for input(s): DDIMER, FERRITIN, LDH, CRP in the last 72 hours.  Lab Results  Component Value Date   [place] NEGATIVE 03/10/2020   [place] NEGATIVE 03/07/2020   [place] NEGATIVE 02/20/2020   [place] NEGATIVE 01/23/2020    CBC: Recent Labs  Lab 03/14/20 0415 03/15/20 0500 03/16/20 0535 03/17/20 0328 03/18/20 0727  WBC 9.9 6.5 10.0 11.0* 12.9*  HGB 9.1* 8.6* 8.4* 8.9* 9.6*  HCT 28.3* 27.1* 26.8* 27.6* 30.6*  MCV 114.6* 112.9* 112.6* 111.7* 112.9*  PLT 51* 59* 74* 89* 95*   Cardiac Enzymes: No results for input(s): CKTOTAL, CKMB, CKMBINDEX, TROPONINI in the last 168 hours. BNP (last 3 results) No results for input(s): PROBNP in the last 8760 hours. CBG: Recent Labs  Lab 03/17/20 0614 03/17/20 1203 03/17/20 1630 03/17/20 2117 03/18/20 0612  GLUCAP 125* 98 268* 303* 105*   D-Dimer: No results for input(s): DDIMER in the last 72 hours. Hgb A1c: No results for input(s): HGBA1C in the last 72 hours. Lipid Profile: No results for input(s): CHOL, HDL, LDLCALC, TRIG, CHOLHDL, LDLDIRECT in the last 72 hours. Thyroid function studies: No results for input(s): TSH, T4TOTAL, T3FREE, THYROIDAB in the last 72 hours.  Invalid input(s): FREET3 Anemia work up: No results for input(s): VITAMINB12, FOLATE, FERRITIN, TIBC, IRON, RETICCTPCT in the  last 72 hours. Sepsis Labs: Recent Labs  Lab 03/12/20 0300 03/12/20 1521 03/13/20 0351 03/13/20 0625 03/14/20 0415 03/14/20 1812 03/15/20 0500 03/16/20 0535 03/17/20 0328 03/18/20 0727  PROCALCITON 6.06  --  7.43  --   --   --   --   --   --   --   WBC 15.3*   < >  --   --    < >  --  6.5 10.0 11.0* 12.9*  LATICACIDVEN  --   --  2.7* 3.4*  --  2.2*  --   --   --   --    < > = values in this interval not displayed.   Microbiology Recent Results (from the past 240 hour(s))  SARS Coronavirus 2 by RT PCR (hospital order, performed in [facility] hospital lab) Nasopharyngeal Nasopharyngeal Swab     Status: None   Collection Time: 03/10/20 10:32 AM   Specimen: Nasopharyngeal Swab  Result Value Ref Range Status   SARS Coronavirus 2 NEGATIVE NEGATIVE Final    Comment: (NOTE) SARS-CoV-2 target nucleic acids are NOT DETECTED.  The SARS-CoV-2 RNA is generally detectable in upper and lower respiratory specimens during the acute phase  of infection. The lowest concentration of SARS-CoV-2 viral copies this assay can detect is 250 copies / mL. A negative result does not preclude SARS-CoV-2 infection and should not be used as the sole basis for treatment or other patient management decisions.  A negative result may occur with improper specimen collection / handling, submission of specimen other than nasopharyngeal swab, presence of viral mutation(s) within the areas targeted by this assay, and inadequate number of viral copies (<250 copies / mL). A negative result must be combined with clinical observations, patient history, and epidemiological information.  Fact Sheet for Patients:   StrictlyIdeas.no  Fact Sheet for Healthcare Providers: BankingDealers.co.za  This test is not yet approved or  cleared by the [place] FDA and has been authorized for detection and/or diagnosis of SARS-CoV-2 by FDA under an Emergency Use Authorization  (EUA).  This EUA will remain in effect (meaning this test can be used) for the duration of the COVID-19 declaration under Section 564(b)(1) of the Act, 21 U.S.C. section 360bbb-3(b)(1), unless the authorization is terminated or revoked sooner.  Performed at [facility], [place] [address], [place], [place] [address]   Blood Culture (routine x 2)     Status: Abnormal   Collection Time: 03/10/20 11:12 AM   Specimen: BLOOD  Result Value Ref Range Status   Specimen Description BLOOD LEFT ANTECUBITAL  Final   Special Requests   Final    BOTTLES DRAWN AEROBIC AND ANAEROBIC Blood Culture adequate volume   Culture  Setup Time   Final    GRAM POSITIVE COCCI IN CHAINS IN BOTH AEROBIC AND ANAEROBIC BOTTLES Organism ID to follow CRITICAL RESULT CALLED TO, READ BACK BY AND VERIFIED WITH: PHARMD VERANDA BRICK AT [place] ON 03/11/2020 Performed at [facility], [place] [address] [place], [place] [address]    Culture ENTEROCOCCUS FAECALIS (A)  Final   Report Status 03/12/2020 FINAL  Final   Organism ID, Bacteria ENTEROCOCCUS FAECALIS  Final      Susceptibility   Enterococcus faecalis - MIC*    AMPICILLIN <=2 SENSITIVE Sensitive     VANCOMYCIN 1 SENSITIVE Sensitive     GENTAMICIN SYNERGY SENSITIVE Sensitive     * ENTEROCOCCUS FAECALIS  Blood Culture ID Panel (Reflexed)     Status: Abnormal   Collection Time: 03/10/20 11:12 AM  Result Value Ref Range Status   Enterococcus species DETECTED (A) NOT DETECTED Final    Comment: CRITICAL RESULT CALLED TO, READ BACK BY AND VERIFIED WITH: PHARMD VERANDA BRICK AT 0052 BY MESSAN HOUEGNIFIO ON 03/11/2020    Vancomycin resistance NOT DETECTED NOT DETECTED Final   Listeria monocytogenes NOT DETECTED NOT DETECTED Final   Staphylococcus species NOT DETECTED NOT DETECTED Final   Staphylococcus aureus (BCID) NOT DETECTED NOT DETECTED Final   Streptococcus species NOT DETECTED NOT DETECTED Final   Streptococcus agalactiae NOT  DETECTED NOT DETECTED Final   Streptococcus pneumoniae NOT DETECTED NOT DETECTED Final   Streptococcus pyogenes NOT DETECTED NOT DETECTED Final   Acinetobacter baumannii NOT DETECTED NOT DETECTED Final   Enterobacteriaceae species NOT DETECTED NOT DETECTED Final   Enterobacter cloacae complex NOT DETECTED NOT DETECTED Final   Escherichia coli NOT DETECTED NOT DETECTED Final   Klebsiella oxytoca NOT DETECTED NOT DETECTED Final   Klebsiella pneumoniae NOT DETECTED NOT DETECTED Final   Proteus species NOT DETECTED NOT DETECTED Final   Serratia marcescens NOT DETECTED NOT DETECTED Final   Haemophilus influenzae NOT DETECTED NOT DETECTED Final  Neisseria meningitidis NOT DETECTED NOT DETECTED Final   Pseudomonas aeruginosa NOT DETECTED NOT DETECTED Final   Candida albicans NOT DETECTED NOT DETECTED Final   Candida glabrata NOT DETECTED NOT DETECTED Final   Candida krusei NOT DETECTED NOT DETECTED Final   Candida parapsilosis NOT DETECTED NOT DETECTED Final   Candida tropicalis NOT DETECTED NOT DETECTED Final    Comment: Performed at [facility], [place] [address], [place], [place] [address]  Blood Culture (routine x 2)     Status: Abnormal   Collection Time: 03/10/20 11:35 AM   Specimen: BLOOD LEFT FOREARM  Result Value Ref Range Status   Specimen Description BLOOD LEFT FOREARM  Final   Special Requests   Final    BOTTLES DRAWN AEROBIC AND ANAEROBIC Blood Culture adequate volume   Culture  Setup Time   Final    GRAM POSITIVE COCCI IN CHAINS IN BOTH AEROBIC AND ANAEROBIC BOTTLES CRITICAL VALUE NOTED.  VALUE IS CONSISTENT WITH PREVIOUSLY REPORTED AND CALLED VALUE.    Culture (A)  Final    ENTEROCOCCUS FAECALIS SUSCEPTIBILITIES PERFORMED ON PREVIOUS CULTURE WITHIN THE LAST 5 DAYS. Performed at [facility], [place] [address], [place], [place] [address]    Report Status 03/12/2020 FINAL  Final  MRSA PCR Screening     Status: Abnormal   Collection Time: 03/10/20  6:08 PM    Specimen: Nasopharyngeal  Result Value Ref Range Status   MRSA by PCR (A) NEGATIVE Final    INVALID, UNABLE TO DETERMINE THE PRESENCE OF TARGET DUE TO SPECIMEN INTEGRITY. RECOLLECTION REQUESTED.    Comment: A,KOTEY @2145  03/10/20 EB Performed at [place] [address] [place], [place] [address]   MRSA PCR Screening     Status: None   Collection Time: 03/11/20  8:17 AM   Specimen: Nasal Mucosa; Nasopharyngeal  Result Value Ref Range Status   MRSA by PCR NEGATIVE NEGATIVE Final    Comment:        The GeneXpert MRSA Assay (FDA approved for NASAL specimens only), is one component of a comprehensive MRSA colonization surveillance program. It is not intended to diagnose MRSA infection nor to guide or monitor treatment for MRSA infections. Performed at [facility], [place] [address] [place], [place] [address]   Urine culture     Status: Abnormal   Collection Time: 03/11/20  9:17 AM   Specimen: In/Out Cath Urine  Result Value Ref Range Status   Specimen Description IN/OUT CATH URINE  Final   Special Requests   Final    NONE Performed at [facility], [place] [address] [place], [place] [address]    Culture MULTIPLE SPECIES PRESENT, SUGGEST RECOLLECTION (A)  Final   Report Status 03/12/2020 FINAL  Final  Culture, blood (routine x 2)     Status: None   Collection Time: 03/12/20 12:13 PM   Specimen: BLOOD LEFT HAND  Result Value Ref Range Status   Specimen Description BLOOD LEFT HAND  Final   Special Requests   Final    BOTTLES DRAWN AEROBIC ONLY Blood Culture results may not be optimal due to an inadequate volume of blood received in culture bottles   Culture   Final    NO GROWTH 5 DAYS Performed at [facility], [place] [address] [place], [place] [address]    Report Status 03/17/2020 FINAL  Final  Culture, blood (routine x 2)     Status: None   Collection Time: 03/12/20  7:17 PM   Specimen: BLOOD LEFT HAND  Result Value Ref Range Status   Specimen  Description BLOOD LEFT HAND  Final   Special Requests   Final    BOTTLES DRAWN AEROBIC ONLY Blood Culture results may not be optimal due to an inadequate volume of blood received in culture bottles   Culture   Final    NO GROWTH 5 DAYS Performed at [facility], [place] [address], [place], [place] [address]    Report Status 03/17/2020 FINAL  Final  Culture, blood (Routine X 2) w Reflex to ID Panel     Status: None (Preliminary result)   Collection Time: 03/14/20  5:59 PM   Specimen: BLOOD LEFT HAND  Result Value Ref Range Status   Specimen Description BLOOD LEFT HAND  Final   Special Requests   Final    BOTTLES DRAWN AEROBIC AND ANAEROBIC Blood Culture adequate volume   Culture   Final    NO GROWTH 3 DAYS Performed at [facility], [place] [address] [place], [place] [address]    Report Status PENDING  Incomplete  Culture, blood (Routine X 2) w Reflex to ID Panel     Status: None (Preliminary result)   Collection Time: 03/14/20 10:09 PM   Specimen: BLOOD  Result Value Ref Range Status   Specimen Description BLOOD LEFT HAND  Final   Special Requests   Final    BOTTLES DRAWN AEROBIC ONLY Blood Culture adequate volume   Culture   Final    NO GROWTH 3 DAYS Performed at [facility], [place] [address] [place], [place] [address]    Report Status PENDING  Incomplete  Culture, blood (Routine X 2) w Reflex to ID Panel     Status: None (Preliminary result)   Collection Time: 03/17/20  3:54 PM   Specimen: BLOOD LEFT ARM  Result Value Ref Range Status   Specimen Description BLOOD LEFT ARM  Final   Special Requests   Final    BOTTLES DRAWN AEROBIC ONLY Blood Culture adequate volume   Culture  Setup Time   Final    GRAM NEGATIVE RODS AEROBIC BOTTLE ONLY CRITICAL RESULT CALLED TO, READ BACK BY AND VERIFIED WITH: A. Rogers Blocker PharmD 8:45 03/18/20 ([email or web address]) Performed at [facility], [place] [address], [place], [place] [address]    Culture GRAM NEGATIVE RODS  Final   Report Status  PENDING  Incomplete  Culture, blood (Routine X 2) w Reflex to ID Panel     Status: None (Preliminary result)   Collection Time: 03/17/20  3:54 PM   Specimen: BLOOD LEFT ARM  Result Value Ref Range Status   Specimen Description BLOOD LEFT ARM  Final   Special Requests   Final    BOTTLES DRAWN AEROBIC ONLY Blood Culture adequate volume   Culture  Setup Time   Final    GRAM NEGATIVE RODS AEROBIC BOTTLE ONLY CRITICAL VALUE NOTED.  VALUE IS CONSISTENT WITH PREVIOUSLY REPORTED AND CALLED VALUE. Performed at [facility], [place] [address], [place], [place] [address]    Culture GRAM NEGATIVE RODS  Final   Report Status PENDING  Incomplete  Blood Culture ID Panel (Reflexed)     Status: Abnormal   Collection Time: 03/17/20  3:54 PM  Result Value Ref Range Status   Enterococcus faecalis NOT DETECTED NOT DETECTED Final   Enterococcus Faecium NOT DETECTED NOT DETECTED Final   Listeria monocytogenes NOT DETECTED NOT DETECTED Final  Staphylococcus species NOT DETECTED NOT DETECTED Final   Staphylococcus aureus (BCID) NOT DETECTED NOT DETECTED Final   Staphylococcus epidermidis NOT DETECTED NOT DETECTED Final   Staphylococcus lugdunensis NOT DETECTED NOT DETECTED Final   Streptococcus species NOT DETECTED NOT DETECTED Final   Streptococcus agalactiae NOT DETECTED NOT DETECTED Final   Streptococcus pneumoniae NOT DETECTED NOT DETECTED Final   Streptococcus pyogenes NOT DETECTED NOT DETECTED Final   A.calcoaceticus-baumannii NOT DETECTED NOT DETECTED Final   Bacteroides fragilis NOT DETECTED NOT DETECTED Final   Enterobacterales DETECTED (A) NOT DETECTED Final    Comment: Enterobacterales represent a large order of gram negative bacteria, not a single organism. CRITICAL RESULT CALLED TO, READ BACK BY AND VERIFIED WITH: A. Rogers Blocker PharmD 8:45 03/18/20 ([email or web address])    Enterobacter cloacae complex NOT DETECTED NOT DETECTED Final   Escherichia coli DETECTED (A) NOT DETECTED Final    Comment: CRITICAL  RESULT CALLED TO, READ BACK BY AND VERIFIED WITH: A. Rogers Blocker PharmD 8:45 03/18/20 ([email or web address])    Klebsiella aerogenes NOT DETECTED NOT DETECTED Final   Klebsiella oxytoca NOT DETECTED NOT DETECTED Final   Klebsiella pneumoniae NOT DETECTED NOT DETECTED Final   Proteus species NOT DETECTED NOT DETECTED Final   Salmonella species NOT DETECTED NOT DETECTED Final   Serratia marcescens NOT DETECTED NOT DETECTED Final   Haemophilus influenzae NOT DETECTED NOT DETECTED Final   Neisseria meningitidis NOT DETECTED NOT DETECTED Final   Pseudomonas aeruginosa NOT DETECTED NOT DETECTED Final   Stenotrophomonas maltophilia NOT DETECTED NOT DETECTED Final   Candida albicans NOT DETECTED NOT DETECTED Final   Candida auris NOT DETECTED NOT DETECTED Final   Candida glabrata NOT DETECTED NOT DETECTED Final   Candida krusei NOT DETECTED NOT DETECTED Final   Candida parapsilosis NOT DETECTED NOT DETECTED Final   Candida tropicalis NOT DETECTED NOT DETECTED Final   Cryptococcus neoformans/gattii NOT DETECTED NOT DETECTED Final   CTX-M ESBL DETECTED (A) NOT DETECTED Final    Comment: CRITICAL RESULT CALLED TO, READ BACK BY AND VERIFIED WITH: A. Rogers Blocker PharmD 8:45 03/18/20 ([email or web address]) (NOTE) Extended spectrum beta-lactamase detected. Recommend a carbapenem as initial therapy.      Carbapenem resistance IMP NOT DETECTED NOT DETECTED Final   Carbapenem resistance KPC NOT DETECTED NOT DETECTED Final   Carbapenem resistance NDM NOT DETECTED NOT DETECTED Final   Carbapenem resist OXA 48 LIKE NOT DETECTED NOT DETECTED Final   Carbapenem resistance VIM NOT DETECTED NOT DETECTED Final    Comment: Performed at [facility], [place] [address] [place], [place] [address]     Medications:   . amiodarone  200 mg Oral BID  . ARIPiprazole  2 mg Oral Daily  . aspirin EC  81 mg Oral Q breakfast  . buPROPion  150 mg Oral Daily  . calcitRIOL  0.25 mcg Oral Q M,W,F  . calcium acetate  2,001 mg Oral TID WC  .  Chlorhexidine Gluconate Cloth  6 each Topical Daily  . diltiazem  90 mg Oral Q6H  . feeding supplement (NEPRO CARB STEADY)  237 mL Oral BID BM  . insulin aspart  0-5 Units Subcutaneous QHS  . insulin aspart  0-6 Units Subcutaneous TID WC  . insulin aspart  2 Units Subcutaneous TID WC  . insulin detemir  30 Units Subcutaneous BID  . levETIRAcetam  250 mg Oral BID  . levothyroxine  50 mcg Oral QAC breakfast  . mouth rinse  15 mL Mouth Rinse BID  . metoprolol tartrate  100 mg Oral  BID  . midodrine  5 mg Oral TID WC  . montelukast  10 mg Oral QHS  . multivitamin  1 tablet Oral QHS  . pantoprazole  40 mg Oral Daily  . predniSONE  10 mg Oral Q breakfast  . primidone  50 mg Oral Daily  . sodium chloride flush  10-40 mL Intracatheter Q12H  . Thrombi-Pad  1 each Topical Once  . traZODone  50 mg Oral QHS   Continuous Infusions: . sodium chloride 250 mL (03/17/20 1309)  . imipenem-cilastatin    . phenylephrine (NEO-SYNEPHRINE) Adult infusion Stopped (03/12/20 1435)      LOS: 8 days   Charlynne Cousins  [facility]  03/18/2020, 9:36 AM

## 2020-03-18 NOTE — Progress Notes (Signed)
Patient ID: Lezlie Octave, female   DOB: 1950/12/30, 69 y.o.   MRN: [id number] S: No complaints this am. O:BP (!) 113/54   Pulse 77   Temp 98.6 F (37 C) (Oral)   Resp 20   Ht 5\' 2"  (1.575 m)   Wt 88.3 kg   SpO2 99%   BMI 35.60 kg/m   Intake/Output Summary (Last 24 hours) at 03/18/2020 0918 Last data filed at 03/17/2020 2200 Gross per 24 hour  Intake 947.06 ml  Output 2000 ml  Net -1052.94 ml   Intake/Output: I/O last 3 completed shifts: In: 1163.8 [P.O.:980; I.V.:83.8; IV Piggyback:100] Out: 2000 [Other:2000]  Intake/Output this shift:  No intake/output data recorded. Weight change: 2.1 kg Gen: obese WF in NAD CVS:RRR, no rub Resp: cta Abd: +BS, soft, NT Ext: trace edema, RAVG +T/B  Recent Labs  Lab 03/12/20 0301 03/12/20 0301 03/13/20 0214 03/14/20 0415 03/14/20 1812 03/15/20 0500 03/16/20 0535 03/17/20 0328 03/18/20 0727  NA 138  --  135 140  --  138 136 138 138  K 4.2  --  4.0 3.9  --  3.9 4.0 3.6 4.1  CL 102  --  100 103  --  100 98 99 98  CO2 22  --  23 25  --  25 25 26 29   GLUCOSE 271*  --  252* 213*  --  326* 394* 151* 69*  BUN 24*  --  32* 49*  --  38* 62* 73* 35*  CREATININE 1.83*  --  2.21* 3.18*  --  2.99* 4.06* 4.78* 3.28*  ALBUMIN 3.0*   < > 3.0*  3.0* 2.8* 2.8* 2.6* 2.8* 2.7* 2.8*  2.7*  CALCIUM 8.6*  --  8.7* 8.9  --  8.6* 9.0 9.4 9.2  PHOS 2.4*  --  2.3* 3.7  --  3.7 3.2 2.6 2.6  AST  --   --  1,563*  --  366* 181*  --   --  38  ALT  --   --  2,005*  --  1,393* 1,118*  --   --  487*   < > = values in this interval not displayed.   Liver Function Tests: Recent Labs  Lab 03/14/20 1812 03/14/20 1812 03/15/20 0500 03/15/20 0500 03/16/20 0535 03/17/20 0328 03/18/20 0727  AST 366*  --  181*  --   --   --  38  ALT 1,393*  --  1,118*  --   --   --  487*  ALKPHOS 145*  --  129*  --   --   --  117  BILITOT 1.6*  --  1.2  --   --   --  0.8  PROT 4.9*  --  4.8*  --   --   --  5.0*  ALBUMIN 2.8*   < > 2.6*   < > 2.8* 2.7* 2.8*  2.7*   < >  = values in this interval not displayed.   No results for input(s): LIPASE, AMYLASE in the last 168 hours. Recent Labs  Lab 03/13/20 0352  AMMONIA 50*   CBC: Recent Labs  Lab 03/14/20 0415 03/14/20 0415 03/15/20 0500 03/15/20 0500 03/16/20 0535 03/17/20 0328 03/18/20 0727  WBC 9.9   < > 6.5   < > 10.0 11.0* 12.9*  HGB 9.1*   < > 8.6*   < > 8.4* 8.9* 9.6*  HCT 28.3*   < > 27.1*   < > 26.8* 27.6* 30.6*  MCV 114.6*  --  112.9*  --  112.6* 111.7* 112.9*  PLT 51*   < > 59*   < > 74* 89* 95*   < > = values in this interval not displayed.   Cardiac Enzymes: No results for input(s): CKTOTAL, CKMB, CKMBINDEX, TROPONINI in the last 168 hours. CBG: Recent Labs  Lab 03/17/20 0614 03/17/20 1203 03/17/20 1630 03/17/20 2117 03/18/20 0612  GLUCAP 125* 98 268* 303* 105*    Iron Studies: No results for input(s): IRON, TIBC, TRANSFERRIN, FERRITIN in the last 72 hours. Studies/Results: No results found. [name] Kitchen amiodarone  200 mg Oral BID  . ARIPiprazole  2 mg Oral Daily  . aspirin EC  81 mg Oral Q breakfast  . buPROPion  150 mg Oral Daily  . calcitRIOL  0.25 mcg Oral Q M,W,F  . calcium acetate  2,001 mg Oral TID WC  . Chlorhexidine Gluconate Cloth  6 each Topical Daily  . diltiazem  90 mg Oral Q6H  . feeding supplement (NEPRO CARB STEADY)  237 mL Oral BID BM  . insulin aspart  0-5 Units Subcutaneous QHS  . insulin aspart  0-6 Units Subcutaneous TID WC  . insulin aspart  2 Units Subcutaneous TID WC  . insulin detemir  30 Units Subcutaneous BID  . levETIRAcetam  250 mg Oral BID  . levothyroxine  50 mcg Oral QAC breakfast  . mouth rinse  15 mL Mouth Rinse BID  . metoprolol tartrate  100 mg Oral BID  . midodrine  5 mg Oral TID WC  . montelukast  10 mg Oral QHS  . multivitamin  1 tablet Oral QHS  . pantoprazole  40 mg Oral Daily  . predniSONE  10 mg Oral Q breakfast  . primidone  50 mg Oral Daily  . sodium chloride flush  10-40 mL Intracatheter Q12H  . Thrombi-Pad  1 each Topical  Once  . traZODone  50 mg Oral QHS    BMET    Component Value Date/Time   NA 138 03/18/2020 0727   K 4.1 03/18/2020 0727   CL 98 03/18/2020 0727   CO2 29 03/18/2020 0727   GLUCOSE 69 (L) 03/18/2020 0727   BUN 35 (H) 03/18/2020 0727   CREATININE 3.28 (H) 03/18/2020 0727   CREATININE 2.54 (H) 08/02/2018 1411   CALCIUM 9.2 03/18/2020 0727   GFRNONAA 14 (L) 03/18/2020 0727   GFRAA 16 (L) 03/18/2020 0727   CBC    Component Value Date/Time   WBC 12.9 (H) 03/18/2020 0727   RBC 2.71 (L) 03/18/2020 0727   HGB 9.6 (L) 03/18/2020 0727   HCT 30.6 (L) 03/18/2020 0727   PLT 95 (L) 03/18/2020 0727   MCV 112.9 (H) 03/18/2020 0727   MCH 35.4 (H) 03/18/2020 0727   MCHC 31.4 03/18/2020 0727   RDW 15.9 (H) 03/18/2020 0727   LYMPHSABS 1.3 03/07/2020 0044   MONOABS 0.9 03/07/2020 0044   EOSABS 0.1 03/07/2020 0044   BASOSABS 0.0 03/07/2020 0044    [facility]  [facility] Bath2/2.5, Dialyzerunknown, Heparindoes not appear to receive heparin with her treatment, only in the catheter. Accessusing TDC.  Assessment/Plan: 1. Enterococcal bacteremia with septic shock- resolved but will need TEE to r/o SBE 2. ESRD- off schedule since transitioning from CRRT to IHD.  Will get back on schedule later this week.  Will plan for HD tomorrow and then again on Saturday. 3. Vascular access- TDC removed 7/29, using RAVG. 4. A fib with RVR- on amio.   5. Hypotension- improved  with midodrine. 6. AMS- waxes and wanes. 7. Acute Hypoxic respiratory failure- currently on [place] 8. ABLA- from [facility] central line.  S/p transfusion.  Eliquis held and FFP given. 9. Thrombocytopenia- per primary. 10. Elevated ALT- improving.  Donetta Potts, MD [employer] [phone]

## 2020-03-18 NOTE — Progress Notes (Signed)
PT Cancellation Note  Patient Details Name: [name] MRN: [id number] DOB: 12/12/1950   Cancelled Treatment:    Reason Eval/Treat Not Completed: Medical issues which prohibited therapy (Pt being taken to [place] on arrival to room. Will return. )   Denice Paradise 03/18/2020, 10:02 AM Clerance Umland W,PT Acute Rehabilitation Services Pager:  [phone]  Office:  [phone]

## 2020-03-18 NOTE — Progress Notes (Signed)
Physical Therapy Treatment Patient Details Name: [name] MRN: [id number] DOB: September 10, 1950 Today's Date: 03/18/2020    History of Present Illness 69 yo admitted from SNF with SOB and AFib with RVR, decompensated heart failure and septic shock with metabolic encephalopathy. PMHx: CHF, AFib, ESRD, RA, Schizophrenia    PT Comments    Pt admitted with above diagnosis. Pt was able to stand to sTedy x 3 x with +2 mod assist standing up to 1 1/2 min when being cleaned and cream being applied. Notified nursing that pt was soiled on arrival.  Was able to clean pt and get her to 3N1 where she had large BM.  Pt was interactive and happy to be up and standing with assist. Left pt in recliner.  Pt currently with functional limitations due to balance and endurance deficits. Pt will benefit from skilled PT to increase their independence and safety with mobility to allow discharge to the venue listed below.     Follow Up Recommendations  SNF;Supervision/Assistance - 24 hour     Equipment Recommendations  None recommended by PT    Recommendations for Other Services       Precautions / Restrictions Precautions Precautions: Fall Precaution Comments: watch HR Restrictions Weight Bearing Restrictions: No    Mobility  Bed Mobility Overal bed mobility: Needs Assistance Bed Mobility: Rolling;Supine to Sit Rolling: Min assist   Supine to sit: Min assist     General bed mobility comments: pt reaching out for therapist and pulling up. once EOB using L UE on bed rail with hand over hand placement to attempt to help with R lateral lean present  Transfers Overall transfer level: Needs assistance Equipment used:  (held to back of chair for transfers.) Transfers: Sit to/from Stand Sit to Stand: +2 physical assistance;Mod assist (from [facility] but was mod (A) with stedy from bed)         General transfer comment: pt requires increased (A) from [facility] compared to from bed  surface  Ambulation/Gait Ambulation/Gait assistance:  (unable to progress steps.)               Stairs             Wheelchair Mobility    Modified Rankin (Stroke Patients Only)       Balance Overall balance assessment: Needs assistance Sitting-balance support: Feet supported;Bilateral upper extremity supported Sitting balance-Leahy Scale: Fair Sitting balance - Comments: close min guard for sitting blance    Standing balance support: Bilateral upper extremity supported Standing balance-Leahy Scale: Poor Standing balance comment: reliant on BIL UE on stedy.  Stood 1 1/2 min , 1 min and 15 seconds to [place].                             Cognition Arousal/Alertness: Awake/alert Behavior During Therapy: WFL for tasks assessed/performed Overall Cognitive Status: Difficult to assess                                 General Comments: does not verbalize that much during session but following commands demonstrating understanding. pt incontience of bowel and bladder without awareness. pt does report need to void when asked and does void on transfer      Exercises General Exercises - Upper Extremity Shoulder Flexion: AAROM;Right;10 reps Elbow Flexion: AROM;AAROM;Both;10 reps;Seated General Exercises - Lower Extremity Long Arc Quad: AROM;Both;Seated;10 reps    General  Comments General comments (skin integrity, edema, etc.): redness noted in peri area due to incontinence. Barrier cream applied x2 this session. pt with large bowel movement and reports being comfortable in chair smiling at end of session pt noted to be on transport tank on arrival with tank empty. diamap reads 91% oxygen saturation. pt did have read 77% during transfer so utilized 3L o2      Pertinent Vitals/Pain Pain Assessment: No/denies pain    Home Living                      Prior Function            PT Goals (current goals can now be found in the care plan  section) Acute Rehab PT Goals Patient Stated Goal: return to facility, return to walking Progress towards PT goals: Progressing toward goals    Frequency    Min 2X/week      PT Plan Current plan remains appropriate    Co-evaluation PT/OT/SLP Co-Evaluation/Treatment: Yes Reason for Co-Treatment: To address functional/ADL transfers;For patient/therapist safety PT goals addressed during session: Mobility/safety with mobility OT goals addressed during session: ADL's and self-care;Proper use of Adaptive equipment and DME      AM-PAC PT "6 Clicks" Mobility   Outcome Measure  Help needed turning from your back to your side while in a flat bed without using bedrails?: A Lot Help needed moving from lying on your back to sitting on the side of a flat bed without using bedrails?: A Lot Help needed moving to and from a bed to a chair (including a wheelchair)?: A Lot Help needed standing up from a chair using your arms (e.g., wheelchair or bedside chair)?: A Lot Help needed to walk in hospital room?: Total Help needed climbing 3-5 steps with a railing? : Total 6 Click Score: 10    End of Session Equipment Utilized During Treatment: Gait belt;Oxygen Activity Tolerance: Patient tolerated treatment well Patient left: with call bell/phone within reach;in chair;with chair alarm set Nurse Communication: Mobility status PT Visit Diagnosis: Other abnormalities of gait and mobility (R26.89);Muscle weakness (generalized) (M62.81)     Time: 9767-3419 PT Time Calculation (min) (ACUTE ONLY): 40 min  Charges:  $Therapeutic Activity: 8-22 mins                     [name] W,PT Acute Rehabilitation Services Pager:  [phone]  Office:  [place] 03/18/2020, 3:40 PM

## 2020-03-18 NOTE — Progress Notes (Signed)
PHARMACY - PHYSICIAN COMMUNICATION CRITICAL VALUE ALERT - BLOOD CULTURE IDENTIFICATION (BCID)  [name] is an 69 y.o. female who presented to [facility] on 03/10/2020 with a chief complaint of tachycardia and has been treated for septic shock d/t Enterococcus faecalis bacteremia. Repeat BCx now growing ESBL E. Coli (BCID2 detected CTX-M gene)  Assessment:   Repeat BCx from L arm growing ESBL E. Coli in both aerobic bottles in each set   Name of physician (or Provider) Contacted: Tammi Klippel  Current antibiotics: Ampicillin  Changes to prescribed antibiotics recommended:  Contacted ID Pharmacist who recommended changing ampicillin to imipenem to cover both the Enterococcus and ESBL E. coli  Results for orders placed or performed during the hospital encounter of 03/10/20  Blood Culture ID Panel (Reflexed) (Collected: 03/10/2020 11:12 AM)  Result Value Ref Range   Enterococcus species DETECTED (A) NOT DETECTED   Vancomycin resistance NOT DETECTED NOT DETECTED   Listeria monocytogenes NOT DETECTED NOT DETECTED   Staphylococcus species NOT DETECTED NOT DETECTED   Staphylococcus aureus (BCID) NOT DETECTED NOT DETECTED   Streptococcus species NOT DETECTED NOT DETECTED   Streptococcus agalactiae NOT DETECTED NOT DETECTED   Streptococcus pneumoniae NOT DETECTED NOT DETECTED   Streptococcus pyogenes NOT DETECTED NOT DETECTED   Acinetobacter baumannii NOT DETECTED NOT DETECTED   Enterobacteriaceae species NOT DETECTED NOT DETECTED   Enterobacter cloacae complex NOT DETECTED NOT DETECTED   Escherichia coli NOT DETECTED NOT DETECTED   Klebsiella oxytoca NOT DETECTED NOT DETECTED   Klebsiella pneumoniae NOT DETECTED NOT DETECTED   Proteus species NOT DETECTED NOT DETECTED   Serratia marcescens NOT DETECTED NOT DETECTED   Haemophilus influenzae NOT DETECTED NOT DETECTED   Neisseria meningitidis NOT DETECTED NOT DETECTED   Pseudomonas aeruginosa NOT DETECTED NOT DETECTED   Candida albicans  NOT DETECTED NOT DETECTED   Candida glabrata NOT DETECTED NOT DETECTED   Candida krusei NOT DETECTED NOT DETECTED   Candida parapsilosis NOT DETECTED NOT DETECTED   Candida tropicalis NOT DETECTED NOT DETECTED   Alfonse Spruce, PharmD PGY2 ID Pharmacy Resident [phone]  03/18/2020  9:11 AM

## 2020-03-18 NOTE — Progress Notes (Signed)
Modified Barium Swallow Progress Note  Patient Details  Name: [name] MRN: [id number] Date of Birth: 02-Jan-1951  Today's Date: 03/18/2020  Modified Barium Swallow completed.  Full report located under Chart Review in the Imaging Section.  Brief recommendations include the following:  Clinical Impression  Pt presents with a mild oropharyngeal dysphagia. Oral deficits included prolonged mastication of solids, piecemeal swallowing, and oral residuals with solid PO; cleared reflexively with additional swallows. Pharyngeal deficits c/b intermittent reduced timing and efficiency of laryngeal vestibule closure allowing for episodic pre swallow laryngeal penetraiton of thin liquids (x1 with cup sip; transient in nature & during barium tablet series with penetrates remaining in laryngeal vestibule). No aspiration visualized within confines of study. Pt with clinical hx of overt difficulty per chart review. Recommend thin liquids and regular textures with meds in puree as clinically tolerated.     Swallow Evaluation Recommendations       SLP Diet Recommendations: Regular solids;Thin liquid   Liquid Administration via: Cup   Medication Administration: Whole meds with puree   Supervision: Patient able to self feed   Compensations: Slow rate;Small sips/bites   Postural Changes: Seated upright at 90 degrees   Oral Care Recommendations: Oral care BID        Takumi Din E Yanuel Tagg MA, CCC-SLP  03/18/2020,12:00 PM

## 2020-03-18 NOTE — Progress Notes (Signed)
Occupational Therapy Treatment Patient Details Name: [name] MRN: [id number] DOB: Aug 30, 1950 Today's Date: 03/18/2020    History of present illness 69 yo admitted from SNF with SOB and AFib with RVR, decompensated heart failure and septic shock with metabolic encephalopathy. PMHx: CHF, AFib, ESRD, RA, Schizophrenia   OT comments  Pt completed bed to bsc to chair transfers this session.pt sit<>Stand x4 with cues during session to remain seated due to impulsive to stand at times. RN notified of oob to chair position. Pt reports she enjoys cross word puzzles in her free time.   Follow Up Recommendations  SNF    Equipment Recommendations  Wheelchair (measurements OT);Wheelchair cushion (measurements OT);Hospital bed    Recommendations for Other Services      Precautions / Restrictions Precautions Precautions: Fall Precaution Comments: watch HR       Mobility Bed Mobility Overal bed mobility: Needs Assistance Bed Mobility: Rolling;Supine to Sit Rolling: Min assist   Supine to sit: Min assist     General bed mobility comments: pt reaching out for therapist and pulling up. once EOB using L UE on bed rail with hand over hand placement to attempt to help with R lateral lean present  Transfers Overall transfer level: Needs assistance   Transfers: Sit to/from Stand Sit to Stand: +2 physical assistance;Mod assist (from [facility] but was mod (A) with stedy from bed)         General transfer comment: pt requires increased (A) from [facility] compared to from bed surface    Balance Overall balance assessment: Needs assistance Sitting-balance support: Feet supported;Bilateral upper extremity supported Sitting balance-Leahy Scale: Fair       Standing balance-Leahy Scale: Poor Standing balance comment: reliant on BIL UE on stedy                           ADL either performed or assessed with clinical judgement   ADL Overall ADL's : Needs  assistance/impaired Eating/Feeding: Minimal assistance;Bed level Eating/Feeding Details (indicate cue type and reason): pt with strong R lean in the bed eating lunch just prior to arrival.  Grooming: Wash/dry face;Minimal assistance;Sitting Grooming Details (indicate cue type and reason): pt initiates task but with poor attention to detail. pt with food still on L side of face. pt needs (A) to clean L side of mouth this session. question mild L inattention to body from this task demonstration                 Toilet Transfer: BSC;+2 for safety/equipment;+2 for physical assistance;Moderate assistance (stedy used with two person (A) ) [employer] forces technical [employer] Details (indicate cue type and reason): pt unable to power up from [facility] without (A) and noted R lean Toileting- Clothing Manipulation and Hygiene: Total assistance Toileting - Clothing Manipulation Details (indicate cue type and reason): static standing for peri care x2 this session 1.5 minutes each time       General ADL Comments: pt side lying peri care in the bed due to incontinence bowel and bladder. pt static sitting eob wtih R lateral lean      Vision       Perception     Praxis      Cognition Arousal/Alertness: Awake/alert Behavior During Therapy: WFL for tasks assessed/performed Overall Cognitive Status: Difficult to assess  General Comments: does not verbalize that much during session but following commands demonstrating understanding. pt incontience of bowel and bladder without awareness. pt does report need to void when asked and does void on transfer        Exercises     Shoulder Instructions       General Comments redness noted in peri area due to incontinence. Barrier cream applied x2 this session. pt with large bowel movement and reports being comfortable in chair smiling at end of session pt noted to be on transport tank on arrival with tank empty. diamap reads 91%  oxygen saturation. pt did have read 77% during transfer so utilized 3L o2    Pertinent Vitals/ Pain       Pain Assessment: No/denies pain  Home Living                                          Prior Functioning/Environment              Frequency  Min 2X/week        Progress Toward Goals  OT Goals(current goals can now be found in the care plan section)  Progress towards OT goals: Progressing toward goals  Acute Rehab OT Goals Patient Stated Goal: return to facility, return to walking OT Goal Formulation: With patient Time For Goal Achievement: 03/25/20 Potential to Achieve Goals: Good ADL Goals Pt Will Perform Grooming: with supervision;sitting Pt Will Perform Upper Body Dressing: with min guard assist;sitting Pt Will Perform Lower Body Dressing: with mod assist;sit to/from stand Pt Will Transfer to Toilet: with mod assist;stand pivot transfer Pt Will Perform Toileting - Clothing Manipulation and hygiene: with mod assist;sit to/from stand Pt/caregiver will Perform Home Exercise Program: Increased strength;Increased ROM;Both right and left upper extremity;With minimal assist;With written HEP provided  Plan Discharge plan remains appropriate    Co-evaluation    PT/OT/SLP Co-Evaluation/Treatment: Yes Reason for Co-Treatment: To address functional/ADL transfers   OT goals addressed during session: ADL's and self-care;Proper use of Adaptive equipment and DME      AM-PAC OT "6 Clicks" Daily Activity     Outcome Measure   Help from another person eating meals?: A Little Help from another person taking care of personal grooming?: A Little Help from another person toileting, which includes using toliet, bedpan, or urinal?: Total Help from another person bathing (including washing, rinsing, drying)?: A Lot Help from another person to put on and taking off regular upper body clothing?: A Lot Help from another person to put on and taking off regular  lower body clothing?: Total 6 Click Score: 12    End of Session Equipment Utilized During Treatment: Oxygen  OT Visit Diagnosis: Muscle weakness (generalized) (M62.81);Unsteadiness on feet (R26.81)   Activity Tolerance Patient tolerated treatment well   Patient Left in chair;with call bell/phone within reach;with chair alarm set   Nurse Communication Mobility status;Precautions        Time: 9811-9147 OT Time Calculation (min): 38 min  Charges: OT General Charges $OT Visit: 1 Visit OT Treatments $Self Care/Home Management : 23-37 mins   Brynn, OTR/L  Acute Rehabilitation Services Pager: [phone] Office: [phone] .    Jeri Modena 03/18/2020, 3:24 PM

## 2020-03-18 NOTE — Progress Notes (Signed)
Nutrition Follow-up  RD working remotely.  DOCUMENTATION CODES:   Not applicable  INTERVENTION:   -Continue Magic cup BID with meals, each supplement provides 290 kcal and 9 grams of protein -D/c Ensure Enlive po BID, each supplement provides 350 kcal and 20 grams of protein -Nepro Shake po BID, each supplement provides 425 kcal and 19 grams protein -Continue renal MVI daily  NUTRITION DIAGNOSIS:   Increased nutrient needs related to acute illness as evidenced by estimated needs.  Ongoing  GOAL:   Patient will meet greater than or equal to 90% of their needs  Progressing   MONITOR:   PO intake, Supplement acceptance, Labs, Weight trends, Skin  REASON FOR ASSESSMENT:   Rounds    ASSESSMENT:   69 yo female admitted with septic shock secondary to enterococcal bacteremia, ESRD requiring CRRT. PMH includes ESRD on HD, DM, CHF, COPD, seizure disorder  7/28- s/p BSE- recommended regular diet with nectar thick liquids, CRRT d/c 7/29- transitioned to iHD, tunneled HD cath removed 8/1- central line d/c  Reviewed I/O's: -1 L x 24 hours and +6.2 L since admission  Attempted to speak with pt via hospital room phone, however, no answer.   Per ID notes, pt awaiting blood cultures to ensure bacteremia has cleared. She will also need a TEE.   Pt with improved appetite; noted meal completion 25-100%. Pt with variable acceptance of Ensure supplements.   Medications reviewed and include phoslo and prednisone.  Labs reviewed: CBGS: 105-303 (inpatient orders for glycemic control are 0-6 units insulin aspart TID with meals, 2 units insulin aspart TID with meals, and 30 units insulin detemir BID).   Diet Order:   Diet Order            Diet renal with fluid restriction Fluid restriction: 1200 mL Fluid; Room service appropriate? Yes with Assist; Fluid consistency: Nectar Thick  Diet effective now                 EDUCATION NEEDS:   Not appropriate for education at this  time  Skin:  Skin Assessment: Skin Integrity Issues: Skin Integrity Issues:: Incisions Stage II: - Unstageable: lt heel Incisions: closed rt arm  Last BM:  03/12/20  Height:   Ht Readings from Last 1 Encounters:  03/12/20 5\' 2"  (1.575 m)    Weight:   Wt Readings from Last 1 Encounters:  03/18/20 88.3 kg    Ideal Body Weight:     BMI:  Body mass index is 35.6 kg/m.  Estimated Nutritional Needs:   Kcal:  2000-2200 kcals  Protein:  100-115 g  Fluid:  1000 mL plus UOP    Loistine Chance, RD, LDN, [place] Registered Dietitian II Certified Diabetes Care and Education Specialist Please refer to [facility] for RD and/or RD on-call/weekend/after hours pager

## 2020-03-18 NOTE — Progress Notes (Signed)
    [facility] has been requested to perform a transesophageal echocardiogram on [name] for bacteremia.  After careful review of history and examination, the risks and benefits of transesophageal echocardiogram have been explained including risks of esophageal damage, perforation (1:10,000 risk), bleeding, pharyngeal hematoma as well as other potential complications associated with conscious sedation including aspiration, arrhythmia, respiratory failure and death. Alternatives to treatment were discussed, questions were answered. Patient is willing to proceed.   I discussed procedure and risks/benefits with the patient, who was able to summarize back what is entailed. However, she requested I speak with her niece, Helene Kelp, who helps make medical decisions for her. I was unable to reach [place] today and was unable to leave a VM. Will try again tomorrow. I will complete orders in the event I'm able to reach her.   Tami Lin Reyah Streeter, [place]  03/18/2020 3:46 PM

## 2020-03-19 ENCOUNTER — Encounter (HOSPITAL_COMMUNITY): Payer: Self-pay | Admitting: Pulmonary Disease

## 2020-03-19 ENCOUNTER — Encounter (HOSPITAL_COMMUNITY): Payer: Self-pay | Admitting: Anesthesiology

## 2020-03-19 ENCOUNTER — Other Ambulatory Visit (HOSPITAL_COMMUNITY): Payer: Medicaid Other

## 2020-03-19 DIAGNOSIS — J9621 Acute and chronic respiratory failure with hypoxia: Secondary | ICD-10-CM | POA: Diagnosis not present

## 2020-03-19 DIAGNOSIS — I5033 Acute on chronic diastolic (congestive) heart failure: Secondary | ICD-10-CM | POA: Diagnosis not present

## 2020-03-19 DIAGNOSIS — I4891 Unspecified atrial fibrillation: Secondary | ICD-10-CM | POA: Diagnosis not present

## 2020-03-19 DIAGNOSIS — R0602 Shortness of breath: Secondary | ICD-10-CM | POA: Diagnosis not present

## 2020-03-19 DIAGNOSIS — R6521 Severe sepsis with septic shock: Secondary | ICD-10-CM | POA: Diagnosis not present

## 2020-03-19 DIAGNOSIS — J90 Pleural effusion, not elsewhere classified: Secondary | ICD-10-CM | POA: Diagnosis not present

## 2020-03-19 DIAGNOSIS — A4181 Sepsis due to Enterococcus: Secondary | ICD-10-CM | POA: Diagnosis not present

## 2020-03-19 DIAGNOSIS — N186 End stage renal disease: Secondary | ICD-10-CM | POA: Diagnosis not present

## 2020-03-19 LAB — RENAL FUNCTION PANEL
Albumin: 2.7 g/dL — ABNORMAL LOW (ref 3.5–5.0)
Anion gap: 12 (ref 5–15)
BUN: 46 mg/dL — ABNORMAL HIGH (ref 8–23)
CO2: 29 mmol/L (ref 22–32)
Calcium: 9.6 mg/dL (ref 8.9–10.3)
Chloride: 95 mmol/L — ABNORMAL LOW (ref 98–111)
Creatinine, Ser: 4.06 mg/dL — ABNORMAL HIGH (ref 0.44–1.00)
GFR calc Af Amer: 12 mL/min — ABNORMAL LOW (ref >60)
GFR calc non Af Amer: 11 mL/min — ABNORMAL LOW (ref >60)
Glucose, Bld: 65 mg/dL — ABNORMAL LOW (ref 70–99)
Phosphorus: 2.3 mg/dL — ABNORMAL LOW (ref 2.5–4.6)
Potassium: 4.3 mmol/L (ref 3.5–5.1)
Sodium: 136 mmol/L (ref 135–145)

## 2020-03-19 LAB — CULTURE, BLOOD (ROUTINE X 2)
Culture: NO GROWTH
Culture: NO GROWTH
Special Requests: ADEQUATE
Special Requests: ADEQUATE

## 2020-03-19 LAB — CBC
HCT: 29.9 % — ABNORMAL LOW (ref 36.0–46.0)
Hemoglobin: 9.3 g/dL — ABNORMAL LOW (ref 12.0–15.0)
MCH: 35 pg — ABNORMAL HIGH (ref 26.0–34.0)
MCHC: 31.1 g/dL (ref 30.0–36.0)
MCV: 112.4 fL — ABNORMAL HIGH (ref 80.0–100.0)
Platelets: 123 10*3/uL — ABNORMAL LOW (ref 150–400)
RBC: 2.66 MIL/uL — ABNORMAL LOW (ref 3.87–5.11)
RDW: 15.9 % — ABNORMAL HIGH (ref 11.5–15.5)
WBC: 15.3 10*3/uL — ABNORMAL HIGH (ref 4.0–10.5)
nRBC: 0 % (ref 0.0–0.2)

## 2020-03-19 LAB — GLUCOSE, CAPILLARY
Glucose-Capillary: 67 mg/dL — ABNORMAL LOW (ref 70–99)
Glucose-Capillary: 110 mg/dL — ABNORMAL HIGH (ref 70–99)
Glucose-Capillary: 55 mg/dL — ABNORMAL LOW (ref 70–99)
Glucose-Capillary: 63 mg/dL — ABNORMAL LOW (ref 70–99)
Glucose-Capillary: 179 mg/dL — ABNORMAL HIGH (ref 70–99)
Glucose-Capillary: 191 mg/dL — ABNORMAL HIGH (ref 70–99)
Glucose-Capillary: 260 mg/dL — ABNORMAL HIGH (ref 70–99)

## 2020-03-19 MED ORDER — DILTIAZEM HCL ER COATED BEADS 180 MG PO CP24
360.0000 mg | ORAL_CAPSULE | Freq: Every day | ORAL | Status: DC
  Administered 2020-03-20 – 2020-03-26 (×7): 360 mg via ORAL
  Filled 2020-03-19: qty 2
  Filled 2020-03-19: qty 1
  Filled 2020-03-19 (×7): qty 2

## 2020-03-19 MED ORDER — DEXTROSE 50 % IV SOLN
INTRAVENOUS | Status: AC
  Filled 2020-03-19: qty 50

## 2020-03-19 MED ORDER — HYDROCODONE-ACETAMINOPHEN 5-325 MG PO TABS
ORAL_TABLET | ORAL | Status: AC
  Administered 2020-03-19: 1
  Filled 2020-03-19: qty 1

## 2020-03-19 MED ORDER — CALCITRIOL 0.25 MCG PO CAPS
ORAL_CAPSULE | ORAL | Status: AC
  Filled 2020-03-19: qty 1

## 2020-03-19 MED ORDER — DEXTROSE 50 % IV SOLN
12.5000 g | INTRAVENOUS | Status: AC
  Administered 2020-03-19: 12.5 g via INTRAVENOUS
  Filled 2020-03-19: qty 50

## 2020-03-19 NOTE — Progress Notes (Signed)
Inpatient Diabetes Program Recommendations  AACE/ADA: New Consensus Statement on Inpatient Glycemic Control (2015)  Target Ranges:  Prepandial:   less than 140 mg/dL      Peak postprandial:   less than 180 mg/dL (1-2 hours)      Critically ill patients:  140 - 180 mg/dL   Lab Results  Component Value Date   GLUCAP 110 (H) 03/19/2020   HGBA1C 8.0 (H) 03/10/2020    Review of Glycemic Control Results for [name], [name] (MRN [id number]) as of 03/19/2020 11:00  Ref. Range 03/18/2020 06:12 03/18/2020 11:11 03/18/2020 17:30 03/18/2020 21:55 03/19/2020 05:54 03/19/2020 06:32  Glucose-Capillary Latest Ref Range: 70 - 99 mg/dL 105 (H) 158 (H) 202 (H) 222 (H) 67 (L) 110 (H)   Inpatient Diabetes Program Recommendations:   -Decrease Levemir 25 units bid  Thank you, [name]. Kathleen [place]ville, RN, MSN, CDE  Diabetes Coordinator Inpatient Glycemic Control Team Team Pager [phone] (8am-5pm) 03/19/2020 11:06 AM

## 2020-03-19 NOTE — Progress Notes (Signed)
PROGRESS NOTE    [name]  KGU:[id number] DOB: 01/14/1951 DOA: 03/10/2020 PCP: Glenda Chroman, MD   Brief Narrative:   [name] is an 69 y.o. female past medical history of chronic respiratory failure with hypoxia secondary to underlying COPD on 3 L of oxygen, A. fib on Eliquis end-stage renal disease on hemodialysis chronic diastolic heart failure moderate AS, who came into the hospital for hyperglycemia and tachycardia was found in A. fib with RVR, decompensated systolic heart failure and septic shock who presented to the ED with atrial fibrillation and RVR with a heart rate of 170 was started on a diltiazem drip on arrival to the ED heart rate improved however she developed progressive hypotension and was transitioned to amiodarone due to hypotension PCCM was consulted and started on pressors for septic shock due to right lower lobe pneumonia.  8/4: She denies complaints today. Awaiting TEE. Continue primaxin. Afebrile. BP looks good.    Assessment & Plan:  Septic shock due to Enterococcus faecalis bacteremia probably due to her HD catheter E coli bacteremia     - ID was consulted he was sensitive to Vanco, then switch to Zyvox, then ampicillin now on imipenem due to blood cultures growing ESBL E. coli.     - TEE pending     - Central line discontinued on 03/16/2020.     - Tunnel catheter successfully removed on 03/13/2020.     - Bld Cx on 03/10/2020 for Enterococcus.     - Bld Cx cultures on 03/17/2020 show ESBL E. coli     - afebrile ON; WBC up a little today, follow  Bleeding subclavian line Thrombocytopenia     - s/p 1 unit of platelets and 2 units of fresh frozen plasma     - Eliquis was held     - plts are trending up, no new bleed noted     - cardiology to dictate when ok to resume eliquis  Persistent atrial fibrillation with RVR:     - Probably driven by her infectious bacteremia.     - Due to heart control heart rate EP was consulted recommended to continue  amiodarone, metoprolol and diltiazem     - caridology onboard, defer to them, appreciate assistance  Acute on chronic respiratory failure with hypoxia acute decompensated diastolic heart failure:     - on baseline O2 use     - volume control w/ HD  Acute metabolic/toxic encephalopathy:     - neurology eval'd; rec'd continuing primidone and Keppra     - last ammonia was 50     - encephalopathy is resolved.  ESRD on HD     - per nephrology  Transaminitis     - Likely due to shock liver     - RUQ US showed no obstructive jaundice.     - Transaminases are improving.  Insulin-dependent diabetes mellitus type 2     - A1c: 8.0     - on levemir 28 units BID; has had some episodes of hypoglycemia today. Decrease to 25 units.     - continue SSI, DM diet  Elevated troponins     - Likely due to demand ischemia, further management per cardiology.  Hypothyroidism:     - Continue Synthroid.  Rheumatoid arthritis:     - On chronic prednisone continue current regimen.  Gout     - denies complaints.      - follow  Schizophrenia     -  on welbutrin, abilify  Hyperkalemia     - resolved on HD  Stage II ulcer present on admission:     - RN Pressure Injury Documentation:     - Pressure Injury 03/10/20 Heel Left Unstageable - Full thickness tissue loss in which the base of the injury is covered by slough (yellow, tan, gray, green or brown) and/or eschar (tan, brown or black) in the wound bed. 3 cm x 2 cm (Active) 03/10/20 1730 Location: Heel Location Orientation: Left Staging: Unstageable - Full thickness tissue loss in which the base of the injury is covered by slough (yellow, tan, gray, green or brown) and/or eschar (tan, brown or black) in the wound bed. Wound Description (Comments): 3 cm x 2 cm Present on Admission: Yes  DVT prophylaxis: SCDs Code Status: FULL Family Communication: None at bedside.   Status is: Inpatient  Remains inpatient appropriate  because:Inpatient level of care appropriate due to severity of illness   Dispo: The patient is from: Home              Anticipated d/c is to: SNF              Anticipated d/c date is: > 3 days              Patient currently is not medically stable to d/c.  Consultants:   Cardiology  Nephrology  Antimicrobials:  . Primaxin   ROS:  Denies CP, N, V, ab pain, dyspnea . Remainder ROS is negative for all not previously mentioned.  Subjective: "I don't think I have any questions."  Objective: Vitals:   03/19/20 1130 03/19/20 1233 03/19/20 1240 03/19/20 1300  BP: 127/72 (!) 142/68 129/68 114/73  Pulse: 65 83 94 94  Resp: 13 15  20   Temp:   (!) 97.5 F (36.4 C) 97.8 F (36.6 C)  TempSrc:   Oral Oral  SpO2: 97% 100%  94%  Weight:  85.5 kg    Height:        Intake/Output Summary (Last 24 hours) at 03/19/2020 1629 Last data filed at 03/19/2020 1233 Gross per 24 hour  Intake 243 ml  Output 2325 ml  Net -2082 ml   Filed Weights   03/19/20 0420 03/19/20 0830 03/19/20 1233  Weight: 87.6 kg 87.8 kg 85.5 kg    Examination:  General: 69 y.o. female resting in bed in NAD, seen on HD Cardiovascular: RRR, +S1, S2, no m/g/r, equal pulses throughout Respiratory: CTABL, no w/r/r, normal WOB GI: BS+, NDNT, no masses noted, no organomegaly noted MSK: No e/c/c Neuro: A&O x 3, no focal deficits Psyc: Appropriate interaction and affect, calm/cooperative   Data Reviewed: I have personally reviewed following labs and imaging studies.  CBC: Recent Labs  Lab 03/15/20 0500 03/16/20 0535 03/17/20 0328 03/18/20 0727 03/19/20 0544  WBC 6.5 10.0 11.0* 12.9* 15.3*  HGB 8.6* 8.4* 8.9* 9.6* 9.3*  HCT 27.1* 26.8* 27.6* 30.6* 29.9*  MCV 112.9* 112.6* 111.7* 112.9* 112.4*  PLT 59* 74* 89* 95* 856*   Basic Metabolic Panel: Recent Labs  Lab 03/15/20 0500 03/16/20 0535 03/17/20 0328 03/18/20 0727 03/19/20 0544  NA 138 136 138 138 136  K 3.9 4.0 3.6 4.1 4.3  CL 100 98 99 98 95*    CO2 25 25 26 29 29   GLUCOSE 326* 394* 151* 69* 65*  BUN 38* 62* 73* 35* 46*  CREATININE 2.99* 4.06* 4.78* 3.28* 4.06*  CALCIUM 8.6* 9.0 9.4 9.2 9.6  PHOS 3.7 3.2 2.6 2.6 2.3*  GFR: Estimated Creatinine Clearance: 13.5 mL/min (A) (by C-G formula based on SCr of 4.06 mg/dL (H)). Liver Function Tests: Recent Labs  Lab 03/13/20 0214 03/14/20 0415 03/14/20 1812 03/14/20 1812 03/15/20 0500 03/16/20 0535 03/17/20 0328 03/18/20 0727 03/19/20 0544  AST 1,563*  --  366*  --  181*  --   --  38  --   ALT 2,005*  --  1,393*  --  1,118*  --   --  487*  --   ALKPHOS 140*  --  145*  --  129*  --   --  117  --   BILITOT 1.4*  --  1.6*  --  1.2  --   --  0.8  --   PROT 5.2*  --  4.9*  --  4.8*  --   --  5.0*  --   ALBUMIN 3.0*  3.0*   < > 2.8*   < > 2.6* 2.8* 2.7* 2.8*  2.7* 2.7*   < > = values in this interval not displayed.   No results for input(s): LIPASE, AMYLASE in the last 168 hours. Recent Labs  Lab 03/13/20 0352  AMMONIA 50*   Coagulation Profile: Recent Labs  Lab 03/13/20 0857 03/14/20 0415  INR 2.0* 1.7*   Cardiac Enzymes: No results for input(s): CKTOTAL, CKMB, CKMBINDEX, TROPONINI in the last 168 hours. BNP (last 3 results) No results for input(s): PROBNP in the last 8760 hours. HbA1C: No results for input(s): HGBA1C in the last 72 hours. CBG: Recent Labs  Lab 03/19/20 0554 03/19/20 0632 03/19/20 1321 03/19/20 1357 03/19/20 1526  GLUCAP 67* 110* 55* 63* 179*   Lipid Profile: No results for input(s): CHOL, HDL, LDLCALC, TRIG, CHOLHDL, LDLDIRECT in the last 72 hours. Thyroid Function Tests: No results for input(s): TSH, T4TOTAL, FREET4, T3FREE, THYROIDAB in the last 72 hours. Anemia Panel: No results for input(s): VITAMINB12, FOLATE, FERRITIN, TIBC, IRON, RETICCTPCT in the last 72 hours. Sepsis Labs: Recent Labs  Lab 03/13/20 0351 03/13/20 0625 03/14/20 1812  PROCALCITON 7.43  --   --   LATICACIDVEN 2.7* 3.4* 2.2*    Recent Results (from the  past 240 hour(s))  SARS Coronavirus 2 by RT PCR (hospital order, performed in [facility] hospital lab) Nasopharyngeal Nasopharyngeal Swab     Status: None   Collection Time: 03/10/20 10:32 AM   Specimen: Nasopharyngeal Swab  Result Value Ref Range Status   SARS Coronavirus 2 NEGATIVE NEGATIVE Final    Comment: (NOTE) SARS-CoV-2 target nucleic acids are NOT DETECTED.  The SARS-CoV-2 RNA is generally detectable in upper and lower respiratory specimens during the acute phase of infection. The lowest concentration of SARS-CoV-2 viral copies this assay can detect is 250 copies / mL. A negative result does not preclude SARS-CoV-2 infection and should not be used as the sole basis for treatment or other patient management decisions.  A negative result may occur with improper specimen collection / handling, submission of specimen other than nasopharyngeal swab, presence of viral mutation(s) within the areas targeted by this assay, and inadequate number of viral copies (<250 copies / mL). A negative result must be combined with clinical observations, patient history, and epidemiological information.  Fact Sheet for Patients:   StrictlyIdeas.no  Fact Sheet for Healthcare Providers: BankingDealers.co.za  This test is not yet approved or  cleared by the [place] FDA and has been authorized for detection and/or diagnosis of SARS-CoV-2 by FDA under an Emergency Use Authorization (EUA).  This EUA will remain in  effect (meaning this test can be used) for the duration of the COVID-19 declaration under Section 564(b)(1) of the Act, 21 U.S.C. section 360bbb-3(b)(1), unless the authorization is terminated or revoked sooner.  Performed at [facility], [place] [address] [place], [place] [address]   Blood Culture (routine x 2)     Status: Abnormal   Collection Time: 03/10/20 11:12 AM   Specimen: BLOOD  Result Value Ref Range Status    Specimen Description BLOOD LEFT ANTECUBITAL  Final   Special Requests   Final    BOTTLES DRAWN AEROBIC AND ANAEROBIC Blood Culture adequate volume   Culture  Setup Time   Final    GRAM POSITIVE COCCI IN CHAINS IN BOTH AEROBIC AND ANAEROBIC BOTTLES Organism ID to follow CRITICAL RESULT CALLED TO, READ BACK BY AND VERIFIED WITH: PHARMD VERANDA BRICK AT [place] ON 03/11/2020 Performed at [facility], [place] [address], [place], [place] [address]    Culture ENTEROCOCCUS FAECALIS (A)  Final   Report Status 03/12/2020 FINAL  Final   Organism ID, Bacteria ENTEROCOCCUS FAECALIS  Final      Susceptibility   Enterococcus faecalis - MIC*    AMPICILLIN <=2 SENSITIVE Sensitive     VANCOMYCIN 1 SENSITIVE Sensitive     GENTAMICIN SYNERGY SENSITIVE Sensitive     * ENTEROCOCCUS FAECALIS  Blood Culture ID Panel (Reflexed)     Status: Abnormal   Collection Time: 03/10/20 11:12 AM  Result Value Ref Range Status   Enterococcus species DETECTED (A) NOT DETECTED Final    Comment: CRITICAL RESULT CALLED TO, READ BACK BY AND VERIFIED WITH: PHARMD VERANDA BRICK AT 0052 BY MESSAN HOUEGNIFIO ON 03/11/2020    Vancomycin resistance NOT DETECTED NOT DETECTED Final   Listeria monocytogenes NOT DETECTED NOT DETECTED Final   Staphylococcus species NOT DETECTED NOT DETECTED Final   Staphylococcus aureus (BCID) NOT DETECTED NOT DETECTED Final   Streptococcus species NOT DETECTED NOT DETECTED Final   Streptococcus agalactiae NOT DETECTED NOT DETECTED Final   Streptococcus pneumoniae NOT DETECTED NOT DETECTED Final   Streptococcus pyogenes NOT DETECTED NOT DETECTED Final   Acinetobacter baumannii NOT DETECTED NOT DETECTED Final   Enterobacteriaceae species NOT DETECTED NOT DETECTED Final   Enterobacter cloacae complex NOT DETECTED NOT DETECTED Final   Escherichia coli NOT DETECTED NOT DETECTED Final   Klebsiella oxytoca NOT DETECTED NOT DETECTED Final   Klebsiella pneumoniae NOT DETECTED NOT  DETECTED Final   Proteus species NOT DETECTED NOT DETECTED Final   Serratia marcescens NOT DETECTED NOT DETECTED Final   Haemophilus influenzae NOT DETECTED NOT DETECTED Final   Neisseria meningitidis NOT DETECTED NOT DETECTED Final   Pseudomonas aeruginosa NOT DETECTED NOT DETECTED Final   Candida albicans NOT DETECTED NOT DETECTED Final   Candida glabrata NOT DETECTED NOT DETECTED Final   Candida krusei NOT DETECTED NOT DETECTED Final   Candida parapsilosis NOT DETECTED NOT DETECTED Final   Candida tropicalis NOT DETECTED NOT DETECTED Final    Comment: Performed at [facility], [place]. [address], [place], [place] [address]  Blood Culture (routine x 2)     Status: Abnormal   Collection Time: 03/10/20 11:35 AM   Specimen: BLOOD LEFT FOREARM  Result Value Ref Range Status   Specimen Description BLOOD LEFT FOREARM  Final   Special Requests   Final    BOTTLES DRAWN AEROBIC AND ANAEROBIC Blood Culture adequate volume   Culture  Setup Time   Final  GRAM POSITIVE COCCI IN CHAINS IN BOTH AEROBIC AND ANAEROBIC BOTTLES CRITICAL VALUE NOTED.  VALUE IS CONSISTENT WITH PREVIOUSLY REPORTED AND CALLED VALUE.    Culture (A)  Final    ENTEROCOCCUS FAECALIS SUSCEPTIBILITIES PERFORMED ON PREVIOUS CULTURE WITHIN THE LAST 5 DAYS. Performed at [facility], [place] [address] [place], [place] [address]    Report Status 03/12/2020 FINAL  Final  MRSA PCR Screening     Status: Abnormal   Collection Time: 03/10/20  6:08 PM   Specimen: Nasopharyngeal  Result Value Ref Range Status   MRSA by PCR (A) NEGATIVE Final    INVALID, UNABLE TO DETERMINE THE PRESENCE OF TARGET DUE TO SPECIMEN INTEGRITY. RECOLLECTION REQUESTED.    Comment: A,KOTEY @2145  03/10/20 EB Performed at [place] [address], [place], [place] [address]   MRSA PCR Screening     Status: None   Collection Time: 03/11/20  8:17 AM   Specimen: Nasal Mucosa; Nasopharyngeal  Result Value Ref Range Status   MRSA by PCR  NEGATIVE NEGATIVE Final    Comment:        The GeneXpert MRSA Assay (FDA approved for NASAL specimens only), is one component of a comprehensive MRSA colonization surveillance program. It is not intended to diagnose MRSA infection nor to guide or monitor treatment for MRSA infections. Performed at [facility], [place] [address] [place], [place] [address]   Urine culture     Status: Abnormal   Collection Time: 03/11/20  9:17 AM   Specimen: In/Out Cath Urine  Result Value Ref Range Status   Specimen Description IN/OUT CATH URINE  Final   Special Requests   Final    NONE Performed at [facility], [place] [address], [place], [place] [address]    Culture MULTIPLE SPECIES PRESENT, SUGGEST RECOLLECTION (A)  Final   Report Status 03/12/2020 FINAL  Final  Culture, blood (routine x 2)     Status: None   Collection Time: 03/12/20 12:13 PM   Specimen: BLOOD LEFT HAND  Result Value Ref Range Status   Specimen Description BLOOD LEFT HAND  Final   Special Requests   Final    BOTTLES DRAWN AEROBIC ONLY Blood Culture results may not be optimal due to an inadequate volume of blood received in culture bottles   Culture   Final    NO GROWTH 5 DAYS Performed at [facility], [place] [address], [place], [place] [address]    Report Status 03/17/2020 FINAL  Final  Culture, blood (routine x 2)     Status: None   Collection Time: 03/12/20  7:17 PM   Specimen: BLOOD LEFT HAND  Result Value Ref Range Status   Specimen Description BLOOD LEFT HAND  Final   Special Requests   Final    BOTTLES DRAWN AEROBIC ONLY Blood Culture results may not be optimal due to an inadequate volume of blood received in culture bottles   Culture   Final    NO GROWTH 5 DAYS Performed at [facility], [place] [address] [place], [place] [address]    Report Status 03/17/2020 FINAL  Final  Culture, blood (Routine X 2) w Reflex to ID Panel     Status: None   Collection Time: 03/14/20  5:59 PM   Specimen:  BLOOD LEFT HAND  Result Value Ref Range Status   Specimen Description BLOOD LEFT HAND  Final   Special Requests   Final    BOTTLES DRAWN  AEROBIC AND ANAEROBIC Blood Culture adequate volume   Culture   Final    NO GROWTH 5 DAYS Performed at [facility], [place] [address], [place], [place] [address]    Report Status 03/19/2020 FINAL  Final  Culture, blood (Routine X 2) w Reflex to ID Panel     Status: None   Collection Time: 03/14/20 10:09 PM   Specimen: BLOOD  Result Value Ref Range Status   Specimen Description BLOOD LEFT HAND  Final   Special Requests   Final    BOTTLES DRAWN AEROBIC ONLY Blood Culture adequate volume   Culture   Final    NO GROWTH 5 DAYS Performed at [facility], [place] [address], [place], [place] [address]    Report Status 03/19/2020 FINAL  Final  Culture, blood (Routine X 2) w Reflex to ID Panel     Status: Abnormal (Preliminary result)   Collection Time: 03/17/20  3:54 PM   Specimen: BLOOD LEFT ARM  Result Value Ref Range Status   Specimen Description BLOOD LEFT ARM  Final   Special Requests   Final    BOTTLES DRAWN AEROBIC ONLY Blood Culture adequate volume   Culture  Setup Time   Final    GRAM NEGATIVE RODS AEROBIC BOTTLE ONLY CRITICAL RESULT CALLED TO, READ BACK BY AND VERIFIED WITH: A. Rogers Blocker PharmD 8:45 03/18/20 ([email or web address]) Performed at [facility], [place] [address], [place], [place] [address]    Culture ESCHERICHIA COLI (A)  Final   Report Status PENDING  Incomplete  Culture, blood (Routine X 2) w Reflex to ID Panel     Status: None (Preliminary result)   Collection Time: 03/17/20  3:54 PM   Specimen: BLOOD LEFT ARM  Result Value Ref Range Status   Specimen Description BLOOD LEFT ARM  Final   Special Requests   Final    BOTTLES DRAWN AEROBIC ONLY Blood Culture adequate volume   Culture  Setup Time   Final    GRAM NEGATIVE RODS AEROBIC BOTTLE ONLY CRITICAL VALUE NOTED.  VALUE IS CONSISTENT WITH PREVIOUSLY REPORTED AND CALLED  VALUE. Performed at [facility], [place] [address], [place], [place] [address]    Culture GRAM NEGATIVE RODS  Final   Report Status PENDING  Incomplete  Blood Culture ID Panel (Reflexed)     Status: Abnormal   Collection Time: 03/17/20  3:54 PM  Result Value Ref Range Status   Enterococcus faecalis NOT DETECTED NOT DETECTED Final   Enterococcus Faecium NOT DETECTED NOT DETECTED Final   Listeria monocytogenes NOT DETECTED NOT DETECTED Final   Staphylococcus species NOT DETECTED NOT DETECTED Final   Staphylococcus aureus (BCID) NOT DETECTED NOT DETECTED Final   Staphylococcus epidermidis NOT DETECTED NOT DETECTED Final   Staphylococcus lugdunensis NOT DETECTED NOT DETECTED Final   Streptococcus species NOT DETECTED NOT DETECTED Final   Streptococcus agalactiae NOT DETECTED NOT DETECTED Final   Streptococcus pneumoniae NOT DETECTED NOT DETECTED Final   Streptococcus pyogenes NOT DETECTED NOT DETECTED Final   A.calcoaceticus-baumannii NOT DETECTED NOT DETECTED Final   Bacteroides fragilis NOT DETECTED NOT DETECTED Final   Enterobacterales DETECTED (A) NOT DETECTED Final    Comment: Enterobacterales represent a large order of gram negative bacteria, not a single organism. CRITICAL RESULT CALLED TO, READ BACK BY AND VERIFIED WITH: A. Rogers Blocker PharmD 8:45 03/18/20 ([email or web address])    Enterobacter cloacae complex NOT DETECTED NOT DETECTED Final   Escherichia coli DETECTED (A) NOT DETECTED Final  Comment: CRITICAL RESULT CALLED TO, READ BACK BY AND VERIFIED WITH: A. Rogers Blocker PharmD 8:45 03/18/20 ([email or web address])    Klebsiella aerogenes NOT DETECTED NOT DETECTED Final   Klebsiella oxytoca NOT DETECTED NOT DETECTED Final   Klebsiella pneumoniae NOT DETECTED NOT DETECTED Final   Proteus species NOT DETECTED NOT DETECTED Final   Salmonella species NOT DETECTED NOT DETECTED Final   Serratia marcescens NOT DETECTED NOT DETECTED Final   Haemophilus influenzae NOT DETECTED NOT DETECTED Final   Neisseria  meningitidis NOT DETECTED NOT DETECTED Final   Pseudomonas aeruginosa NOT DETECTED NOT DETECTED Final   Stenotrophomonas maltophilia NOT DETECTED NOT DETECTED Final   Candida albicans NOT DETECTED NOT DETECTED Final   Candida auris NOT DETECTED NOT DETECTED Final   Candida glabrata NOT DETECTED NOT DETECTED Final   Candida krusei NOT DETECTED NOT DETECTED Final   Candida parapsilosis NOT DETECTED NOT DETECTED Final   Candida tropicalis NOT DETECTED NOT DETECTED Final   Cryptococcus neoformans/gattii NOT DETECTED NOT DETECTED Final   CTX-M ESBL DETECTED (A) NOT DETECTED Final    Comment: CRITICAL RESULT CALLED TO, READ BACK BY AND VERIFIED WITH: A. Rogers Blocker PharmD 8:45 03/18/20 (wilsonm) (NOTE) Extended spectrum beta-lactamase detected. Recommend a carbapenem as initial therapy.      Carbapenem resistance IMP NOT DETECTED NOT DETECTED Final   Carbapenem resistance KPC NOT DETECTED NOT DETECTED Final   Carbapenem resistance NDM NOT DETECTED NOT DETECTED Final   Carbapenem resist OXA 48 LIKE NOT DETECTED NOT DETECTED Final   Carbapenem resistance VIM NOT DETECTED NOT DETECTED Final    Comment: Performed at [facility], [place] [address], [place], [place] [address]      Radiology Studies: CT ABDOMEN PELVIS W CONTRAST  Result Date: 03/18/2020 CLINICAL DATA:  69 year old female with sepsis. EXAM: CT ABDOMEN AND PELVIS WITH CONTRAST TECHNIQUE: Multidetector CT imaging of the abdomen and pelvis was performed using the standard protocol following bolus administration of intravenous contrast. CONTRAST:  163mL OMNIPAQUE IOHEXOL 300 MG/ML  SOLN COMPARISON:  None. FINDINGS: Evaluation is limited due to streak artifact caused by patient's arms. Lower chest: There is a moderate size right pleural effusion with higher attenuating content suspicious for a serosanguineous or hemorrhagic fluid. Clinical correlation is recommended. There is consolidative changes of the visualized right lower lobe which may  represent compressive atelectasis although infiltrate is not excluded. There is a small left pleural effusion with partial left lung base atelectasis. There is mild cardiomegaly. Advanced coronary vascular calcification. No intra-abdominal free air. Small perihepatic ascites. Hepatobiliary: The liver is unremarkable. No intrahepatic biliary ductal dilatation. Cholecystectomy. Pancreas: Unremarkable. No pancreatic ductal dilatation or surrounding inflammatory changes. Spleen: Normal in size without focal abnormality. Adrenals/Urinary Tract: The adrenal glands unremarkable. There is moderate bilateral renal parenchyma atrophy. There is no hydronephrosis on either side. The visualized ureters and urinary bladder appear unremarkable. Stomach/Bowel: There is sigmoid diverticulosis without active inflammatory changes. There is moderate stool throughout the colon. There is no bowel obstruction. Appendectomy. Vascular/Lymphatic: Mild aortoiliac atherosclerotic disease. The IVC is unremarkable. No portal venous gas. There is no adenopathy. Reproductive: The uterus is grossly unremarkable. Other: There is diffuse subcutaneous edema and anasarca. No fluid collection. Small fat containing umbilical hernia. Musculoskeletal: Degenerative changes of the spine. No acute osseous pathology. IMPRESSION: 1. Moderate size right pleural effusion with higher attenuating content suspicious for a serosanguineous or hemorrhagic fluid. Clinical correlation is recommended. There is compressive atelectasis of the right lower lobe. 2. Small left pleural effusion with partial left lung  base atelectasis. 3. Sigmoid diverticulosis. No bowel obstruction. 4. Diffuse subcutaneous edema and anasarca. 5. Aortic Atherosclerosis (ICD10-I70.0). These results will be called to the ordering clinician or representative by the Radiologist Assistant, and communication documented in the PACS or [employer]. Electronically Signed   By: Anner Crete M.D.    On: 03/18/2020 19:32   DG Swallowing Func-Speech Pathology  Result Date: 03/18/2020 Objective Swallowing Evaluation: Type of Study: MBS-Modified Barium Swallow Study  Patient Details Name: DELECIA VASTINE MRN: [id number] Date of Birth: 11-24-1950 Today's Date: 03/18/2020 Time: SLP Start Time (ACUTE ONLY): 1010 -SLP Stop Time (ACUTE ONLY): 1037 SLP Time Calculation (min) (ACUTE ONLY): 27 min Past Medical History: Past Medical History: Diagnosis Date . Anemia  . Angina pectoris ([place])  . Anxiety  . Asthma  . Atrial flutter ([place])  . Back pain  . Barrett esophagus  . Bifascicular block  . BMI 39.0-39.9,adult  . Cellulitis  . CHF (congestive heart failure) ([place])  . Chronic diastolic CHF (congestive heart failure) ([place])  . Constipation  . Contact with and (suspected) exposure to covid-19  . COPD (chronic obstructive pulmonary disease) ([place])  . Dependence on renal dialysis ([place])  . Depression  . Dysphagia  . Dysuria  . Epilepsy ([place])  . ESRD on hemodialysis ([place])  . Essential hypertension  . Fibromyalgia  . Fibromyalgia  . GERD (gastroesophageal reflux disease)  . Gout  . History of pericarditis  . Hypercholesterolemia  . Hyperlipidemia  . Hypothyroid  . Hypothyroidism  . Insomnia  . Long term (current) use of insulin ([place])  . Major depressive disorder  . Memory loss  . Moderate protein-calorie malnutrition ([place])  . Morbid obesity ([place])  . Nephrolithiasis   2008 . Obesity hypoventilation syndrome (H[facility])   CPAP . Obstructive sleep apnea  . Pain in left arm  . PE (pulmonary embolism)   2010 . Peripheral neuropathy  . Persistent atrial fibrillation ([place])  . Pneumonia  . Polyp of nasal sinus  . PTSD (post-traumatic stress disorder)  . Pulmonary hypertension ([place])  . Rheumatoid arteritis ([place])  . Rheumatoid arthritis ([place])  . Schizophrenia ([place])  . Secondary Parkinson disease ([place]) 10/19/2018 . Seizures ([place])  . Steroid dependence ([place])  . Tachycardia, unspecified  . Type 2 diabetes mellitus ([place])  . Unspecified intellectual  disabilities  . Unspecified psychosis not due to a substance or known physiological condition ([place])  . Urinary tract infection  . Vitamin D deficiency  Past Surgical History: Past Surgical History: Procedure Laterality Date . A/V FISTULAGRAM N/A 01/28/2020  Procedure: A/V FISTULAGRAM - Right [place];  Surgeon: Waynetta Sandy, MD;  Location: [facility];  Service: Cardiovascular;  Laterality: N/A; . APPENDECTOMY   . AV FISTULA PLACEMENT Right 09/27/2019  Procedure: ARTERIOVENOUS (AV) FISTULA CREATION;  Surgeon: Waynetta Sandy, MD;  Location: [facility];  Service: Vascular;  Laterality: Right; . AV FISTULA PLACEMENT Right 02/20/2020  Procedure: INSERTION OF ARTERIOVENOUS (AV) GORE-TEX GRAFT ARM;  Surgeon: Waynetta Sandy, MD;  Location: [facility];  Service: Vascular;  Laterality: Right; . CHOLECYSTECTOMY   . COLONOSCOPY    benign polyps (12/2000), repeat colonoscopy performed in 2007 . ENDOMETRIAL BIOPSY    10/03 benign columnar mucosa (limited material) . INSERTION OF DIALYSIS CATHETER Right 09/14/2019  Procedure: ATTEMPTED INSERTION OF DIALYSIS CATHETER;  Surgeon: Virl Cagey, MD;  Location: [facility];  Service: General;  Laterality: Right; . INSERTION OF DIALYSIS CATHETER Right 09/27/2019  Procedure: INSERTION OF DIALYSIS CATHETER;  Surgeon: Waynetta Sandy,  MD;  Location: [facility];  Service: Vascular;  Laterality: Right; . IR REMOVAL TUN CV CATH W/O FL  03/13/2020 . KIDNEY SURGERY   . REVISON OF ARTERIOVENOUS FISTULA Right 02/20/2020  Procedure: REVISON OF ARTERIOVENOUS FISTULA;  Surgeon: Waynetta Sandy, MD;  Location: [facility];  Service: Vascular;  Laterality: Right; HPI: 69 y.o. female, known to SLP services, with medical history significant for acute-on-chronic dysphagia, chronic hypoxemic respiratory failure secondary to underlying COPD-on 3 L of oxygen via nasal cannula, paroxysmal A. fib-on Eliquis, ESRD on hemodialysis (TTS), diastolic CHF, moderate AS, hypertension,  hyperlipidemia, diabetes mellitus, obstructive sleep apnea/OHS, rheumatoid arthritis, seizure disorder, depression/anxiety, bipolar disorder presented to emergency department 7/26 with SOB and elevated blood glucose. Developed resp failure, transferred to ICU, on BIPAP, CRRT initiated; Dx sepsis secondary to RLL pna, afib with RVR, acute metabolic encephalopathy.  Pt has been followed by SLP during multiple admissions the last several years, with acute decompensations of swallow function.  She has had MBS studies, some of which showed deep penetration and/or aspiration of thin liquids, chin tuck being effective at one time.  Subjective: alert, communicative Assessment / Plan / Recommendation CHL IP CLINICAL IMPRESSIONS 03/18/2020 Clinical Impression Pt presents with a mild oropharyngeal dysphagia. Oral deficits included prolonged mastication of solids, piecemeal swallowing, and oral residuals with solid PO; cleared reflexively with additional swallows. Pharyngeal deficits c/b intermittent reduced timing and efficiency of laryngeal vestibule closure allowing for episodic pre swallow laryngeal penetraiton of thin liquids (x1 with cup sip; transient in nature & during barium tablet series with penetrates remaining in laryngeal vestibule). No aspiration visualized within confines of study. Pt with clinical hx of overt difficulty per chart review. Recommend thin liquids and regular textures with meds in puree as clinically tolerated.   SLP Visit Diagnosis Dysphagia, oropharyngeal phase (R13.12) Attention and concentration deficit following -- Frontal lobe and executive function deficit following -- Impact on safety and function Mild aspiration risk   CHL IP TREATMENT RECOMMENDATION 03/18/2020 Treatment Recommendations Therapy as outlined in treatment plan below   Prognosis 03/18/2020 Prognosis for Safe Diet Advancement Good Barriers to Reach Goals -- Barriers/Prognosis Comment -- CHL IP DIET RECOMMENDATION 03/18/2020 SLP Diet  Recommendations Regular solids;Thin liquid Liquid Administration via Cup Medication Administration Whole meds with puree Compensations Slow rate;Small sips/bites Postural Changes Seated upright at 90 degrees   CHL IP OTHER RECOMMENDATIONS 03/18/2020 Recommended Consults -- Oral Care Recommendations Oral care BID Other Recommendations --   CHL IP FOLLOW UP RECOMMENDATIONS 03/17/2020 Follow up Recommendations Other (comment)   CHL IP FREQUENCY AND DURATION 03/18/2020 Speech Therapy Frequency (ACUTE ONLY) min 2x/week Treatment Duration 2 weeks      CHL IP ORAL PHASE 03/18/2020 Oral Phase Impaired Oral - Pudding Teaspoon -- Oral - Pudding Cup -- Oral - Honey Teaspoon -- Oral - Honey Cup -- Oral - Nectar Teaspoon -- Oral - Nectar Cup Delayed oral transit Oral - Nectar Straw -- Oral - Thin Teaspoon -- Oral - Thin Cup Delayed oral transit Oral - Thin Straw Delayed oral transit Oral - Puree WFL Oral - Mech Soft -- Oral - Regular Decreased bolus cohesion;Lingual/palatal residue;Piecemeal swallowing;Delayed oral transit Oral - Multi-Consistency -- Oral - Pill WFL Oral Phase - Comment --  CHL IP PHARYNGEAL PHASE 03/18/2020 Pharyngeal Phase Impaired Pharyngeal- Pudding Teaspoon -- Pharyngeal -- Pharyngeal- Pudding Cup -- Pharyngeal -- Pharyngeal- Honey Teaspoon -- Pharyngeal -- Pharyngeal- Honey Cup -- Pharyngeal -- Pharyngeal- Nectar Teaspoon -- Pharyngeal -- Pharyngeal- Nectar Cup Pharyngeal residue - valleculae;Delayed swallow initiation-pyriform  sinuses Pharyngeal Material does not enter airway Pharyngeal- Nectar Straw -- Pharyngeal -- Pharyngeal- Thin Teaspoon -- Pharyngeal -- Pharyngeal- Thin Cup Reduced epiglottic inversion;Penetration/Aspiration before swallow;Delayed swallow initiation-pyriform sinuses;Pharyngeal residue - valleculae Pharyngeal Material enters airway, remains ABOVE vocal cords then ejected out;Material does not enter airway Pharyngeal- Thin Straw Delayed swallow initiation-pyriform sinuses Pharyngeal --  Pharyngeal- Puree WFL Pharyngeal -- Pharyngeal- Mechanical Soft -- Pharyngeal -- Pharyngeal- Regular Delayed swallow initiation-vallecula;Pharyngeal residue - valleculae Pharyngeal -- Pharyngeal- Multi-consistency -- Pharyngeal -- Pharyngeal- Pill Reduced epiglottic inversion;Delayed swallow initiation-pyriform sinuses;Penetration/Aspiration before swallow;Penetration/Aspiration during swallow Pharyngeal Material enters airway, remains ABOVE vocal cords and not ejected out Pharyngeal Comment --  CHL IP CERVICAL ESOPHAGEAL PHASE 03/18/2020 Cervical Esophageal Phase WFL Pudding Teaspoon -- Pudding Cup -- Honey Teaspoon -- Honey Cup -- Nectar Teaspoon -- Nectar Cup -- Nectar Straw -- Thin Teaspoon -- Thin Cup -- Thin Straw -- Puree -- Mechanical Soft -- Regular -- Multi-consistency -- Pill -- Cervical Esophageal Comment -- Chelsea E Hartness MA, CCC-SLP 03/18/2020, 11:59 AM                Scheduled Meds: . amiodarone  200 mg Oral BID  . ARIPiprazole  2 mg Oral Daily  . aspirin EC  81 mg Oral Q breakfast  . buPROPion  150 mg Oral Daily  . calcitRIOL      . calcitRIOL  0.25 mcg Oral Q M,W,F  . calcium acetate  2,001 mg Oral TID WC  . Chlorhexidine Gluconate Cloth  6 each Topical Daily  . dextrose      . [START ON 03/20/2020] diltiazem  360 mg Oral Daily  . diltiazem  90 mg Oral Q6H  . feeding supplement (NEPRO CARB STEADY)  237 mL Oral BID BM  . insulin aspart  0-5 Units Subcutaneous QHS  . insulin aspart  0-6 Units Subcutaneous TID WC  . insulin aspart  4 Units Subcutaneous TID WC  . insulin detemir  28 Units Subcutaneous BID  . levETIRAcetam  250 mg Oral BID  . levothyroxine  50 mcg Oral QAC breakfast  . mouth rinse  15 mL Mouth Rinse BID  . metoprolol tartrate  100 mg Oral BID  . midodrine  5 mg Oral TID WC  . montelukast  10 mg Oral QHS  . multivitamin  1 tablet Oral QHS  . pantoprazole  40 mg Oral Daily  . predniSONE  10 mg Oral Q breakfast  . primidone  50 mg Oral Daily  . sodium chloride  flush  10-40 mL Intracatheter Q12H  . Thrombi-Pad  1 each Topical Once  . traZODone  50 mg Oral QHS   Continuous Infusions: . sodium chloride 250 mL (03/17/20 1309)  . imipenem-cilastatin 500 mg (03/19/20 1548)  . phenylephrine (NEO-SYNEPHRINE) Adult infusion Stopped (03/12/20 1435)     LOS: 9 days    Time spent: 25 minutes spent in the coordination of care today.    Jonnie Finner, DO [facility]  If 7PM-7AM, please contact night-coverage [email or web address] 03/19/2020, 4:29 PM

## 2020-03-19 NOTE — Progress Notes (Signed)
TEE canceled for today. I was unable to reach her niece, Helene Kelp, for consent. Team will continue trying to reach her. TEE tentatively moved to tomorrow. NPO at MN.   Ledora Bottcher, PA-C 03/19/2020, 2:13 PM [place] [address] [place], [place] [address]

## 2020-03-19 NOTE — Progress Notes (Signed)
Unable to get in touch with the patient niece for TEE consent, cardiology PA and Endo notified.

## 2020-03-19 NOTE — Progress Notes (Signed)
TEE rescheduled for 03/20/20, primary RN notified of this.

## 2020-03-19 NOTE — Progress Notes (Signed)
Patient niece called  No response will try again.

## 2020-03-19 NOTE — Progress Notes (Signed)
Hypoglycemic Event  CBG: 67  Treatment: 25g d50  Symptoms: asymptomatic   Follow-up CBG Time: 0632  CBG Result:110  Possible Reasons for Event: poor PO intake  Comments/MD notified:hypoglycemia standing orders followed.     [place]

## 2020-03-19 NOTE — Procedures (Signed)
I was present at this dialysis session. I have reviewed the session itself and made appropriate changes.   Vital signs in last 24 hours:  Temp:  [97.6 F (36.4 C)-98.1 F (36.7 C)] 98.1 F (36.7 C) (08/04 0420) Pulse Rate:  [75-103] 75 (08/04 0420) Resp:  [15-20] 15 (08/04 0420) BP: (116-140)/(67-91) 116/67 (08/04 0420) SpO2:  [97 %-100 %] 100 % (08/04 0420) Weight:  [87.6 kg] 87.6 kg (08/04 0420) Weight change: -1.7 kg Filed Weights   03/17/20 1113 03/18/20 0400 03/19/20 0420  Weight: 87.3 kg 88.3 kg 87.6 kg    Recent Labs  Lab 03/19/20 0544  NA 136  K 4.3  CL 95*  CO2 29  GLUCOSE 65*  BUN 46*  CREATININE 4.06*  CALCIUM 9.6  PHOS 2.3*    Recent Labs  Lab 03/17/20 0328 03/18/20 0727 03/19/20 0544  WBC 11.0* 12.9* 15.3*  HGB 8.9* 9.6* 9.3*  HCT 27.6* 30.6* 29.9*  MCV 111.7* 112.9* 112.4*  PLT 89* 95* 123*    Scheduled Meds: . amiodarone  200 mg Oral BID  . ARIPiprazole  2 mg Oral Daily  . aspirin EC  81 mg Oral Q breakfast  . buPROPion  150 mg Oral Daily  . calcitRIOL  0.25 mcg Oral Q M,W,F  . calcium acetate  2,001 mg Oral TID WC  . Chlorhexidine Gluconate Cloth  6 each Topical Daily  . diltiazem  90 mg Oral Q6H  . feeding supplement (NEPRO CARB STEADY)  237 mL Oral BID BM  . insulin aspart  0-5 Units Subcutaneous QHS  . insulin aspart  0-6 Units Subcutaneous TID WC  . insulin aspart  4 Units Subcutaneous TID WC  . insulin detemir  28 Units Subcutaneous BID  . levETIRAcetam  250 mg Oral BID  . levothyroxine  50 mcg Oral QAC breakfast  . mouth rinse  15 mL Mouth Rinse BID  . metoprolol tartrate  100 mg Oral BID  . midodrine  5 mg Oral TID WC  . montelukast  10 mg Oral QHS  . multivitamin  1 tablet Oral QHS  . pantoprazole  40 mg Oral Daily  . predniSONE  10 mg Oral Q breakfast  . primidone  50 mg Oral Daily  . sodium chloride flush  10-40 mL Intracatheter Q12H  . Thrombi-Pad  1 each Topical Once  . traZODone  50 mg Oral QHS   Continuous  Infusions: . sodium chloride 250 mL (03/17/20 1309)  . imipenem-cilastatin 500 mg (03/18/20 2201)  . phenylephrine (NEO-SYNEPHRINE) Adult infusion Stopped (03/12/20 1435)   PRN Meds:.acetaminophen, albuterol, calcium carbonate, diphenhydrAMINE, docusate sodium, EPINEPHrine, heparin, HYDROcodone-acetaminophen, metoprolol tartrate, nitroGLYCERIN, ondansetron (ZOFRAN) IV, Resource ThickenUp Clear, sodium chloride flush    [facility] HD Bath2/2.5, Dialyzerunknown, Heparindoes not appear to receive heparin with her treatment, only in the catheter. Accessusing TDC.  Assessment/Plan: 1. Enterococcal bacteremia with septic shock- resolved but will need TEE to r/o SBE 2. ESRD- off schedule since transitioning from CRRT to IHD. Will get back on schedule later this week.  Will plan for HD on Saturday to get back on her outpatient schedule. 3. Vascular access- TDC removed 7/29, using RAVG. 4. A fib with RVR- on amio.  5. Hypotension- improved with midodrine. 6. AMS- waxes and wanes. 7. Acute Hypoxic respiratory failure- currently on [place] 8. ABLA- from [facility] central line. S/p transfusion. Eliquis held and FFP given. 9. Thrombocytopenia- per primary. 10. Elevated ALT- improving.  Donetta Potts,  MD 03/19/2020,  8:35 AM

## 2020-03-19 NOTE — Anesthesia Preprocedure Evaluation (Deleted)
Anesthesia Evaluation    Airway        Dental   Pulmonary asthma , sleep apnea and Continuous Positive Airway Pressure Ventilation , pneumonia, COPD,  COPD inhaler and oxygen dependent, former smoker, PE          Cardiovascular Exercise Tolerance: Poor hypertension, Pt. on medications and Pt. on home beta blockers + angina with exertion + Peripheral Vascular Disease and +CHF  + dysrhythmias Atrial Fibrillation   ECG: a-fib, rate 118   Neuro/Psych Seizures -, Well Controlled,  PSYCHIATRIC DISORDERS Anxiety Depression Schizophrenia PTSDDiabetic neuropathy  Neuromuscular disease    GI/Hepatic Neg liver ROS, GERD  Medicated and Controlled,  Endo/Other  diabetes, Poorly Controlled, Type 2, Insulin DependentHypothyroidism   Renal/GU ESRF and DialysisRenal disease (On HD T,R,Sat)Hx/o renal calculi  negative genitourinary   Musculoskeletal  (+) Arthritis , Rheumatoid disorders,  Fibromyalgia -Chronic Back pain   Abdominal   Peds  Hematology  (+) anemia , Eliquis therapy- last dose   Anesthesia Other Findings   Reproductive/Obstetrics                            Anesthesia Physical  Anesthesia Plan  ASA: IV  Anesthesia Plan: MAC   Post-op Pain Management:    Induction: Intravenous  PONV Risk Score and Plan: 2 and Ondansetron, Propofol infusion and Treatment may vary due to age or medical condition  Airway Management Planned: Natural Airway and Nasal Cannula  Additional Equipment:   Intra-op Plan:   Post-operative Plan:   Informed Consent: I have reviewed the patients History and Physical, chart, labs and discussed the procedure including the risks, benefits and alternatives for the proposed anesthesia with the patient or authorized representative who has indicated his/her understanding and acceptance.     Dental advisory given  Plan Discussed with: CRNA  Anesthesia Plan Comments: (EKG  01/31/20: Atrial fibrillation. Vent rate 118. RBBB and LAFB. LVH with secondary repolarization abnormality. No significant change since 01/24/2020  TTE 11/23/19: 1. Left ventricular ejection fraction, by estimation, is 65 to 70%. The  left ventricle has normal function. The left ventricle has no regional  wall motion abnormalities. There is mild left ventricular hypertrophy.  Left ventricular diastolic parameters  are indeterminate.  2. Right ventricular systolic function is normal. The right ventricular  size is moderately enlarged. There is moderately elevated pulmonary artery  systolic pressure.  3. Left atrial size was severely dilated.  4. Right atrial size was severely dilated.  5. The mitral valve is normal in structure. Mild mitral valve  regurgitation. No evidence of mitral stenosis.  6. Tricuspid valve regurgitation is moderate.  7. The aortic valve is tricuspid. Aortic valve regurgitation is not  visualized. Mild to moderate aortic valve stenosis.  8. The inferior vena cava is normal in size with greater than 50%  respiratory variability, suggesting right atrial pressure of 3 mmHg.   )        Anesthesia Quick Evaluation

## 2020-03-19 NOTE — Progress Notes (Signed)
Subjective: No new complaints   Antibiotics:  Anti-infectives (From admission, onward)   Start     Dose/Rate Route Frequency Ordered Stop   03/18/20 1000  imipenem-cilastatin (PRIMAXIN) 500 mg in sodium chloride 0.9 % 100 mL IVPB     Discontinue     500 mg 200 mL/hr over 30 Minutes Intravenous Every 12 hours 03/18/20 0856     03/13/20 1030  ampicillin (OMNIPEN) 2 g in sodium chloride 0.9 % 100 mL IVPB  Status:  Discontinued        2 g 300 mL/hr over 20 Minutes Intravenous Every 12 hours 03/13/20 1007 03/18/20 0856   03/13/20 0830  amoxicillin (AMOXIL) capsule 500 mg        500 mg Oral  Once 03/13/20 0748 03/13/20 0848   03/11/20 1200  ceFEPIme (MAXIPIME) 1 g in sodium chloride 0.9 % 100 mL IVPB  Status:  Discontinued        1 g 200 mL/hr over 30 Minutes Intravenous Every 24 hours 03/10/20 1141 03/10/20 1835   03/11/20 1200  vancomycin (VANCOREADY) IVPB 750 mg/150 mL  Status:  Discontinued        750 mg 150 mL/hr over 60 Minutes Intravenous Every 24 hours 03/10/20 1835 03/11/20 0921   03/11/20 1000  linezolid (ZYVOX) tablet 600 mg  Status:  Discontinued        600 mg Oral Every 12 hours 03/11/20 0921 03/13/20 1000   03/11/20 0000  vancomycin variable dose per unstable renal function (pharmacist dosing)  Status:  Discontinued         Does not apply See admin instructions 03/10/20 1141 03/10/20 1835   03/11/20 0000  ceFEPIme (MAXIPIME) 2 g in sodium chloride 0.9 % 100 mL IVPB  Status:  Discontinued        2 g 200 mL/hr over 30 Minutes Intravenous Every 12 hours 03/10/20 1835 03/11/20 0921   03/10/20 1145  ceFEPIme (MAXIPIME) 2 g in sodium chloride 0.9 % 100 mL IVPB        2 g 200 mL/hr over 30 Minutes Intravenous  Once 03/10/20 1133 03/10/20 1217   03/10/20 1145  vancomycin (VANCOREADY) IVPB 2000 mg/400 mL        2,000 mg 200 mL/hr over 120 Minutes Intravenous  Once 03/10/20 1137 03/10/20 1731   03/10/20 1115  aztreonam (AZACTAM) 2 g in sodium chloride 0.9 % 100 mL IVPB   Status:  Discontinued        2 g 200 mL/hr over 30 Minutes Intravenous  Once 03/10/20 1108 03/10/20 1133   03/10/20 1115  metroNIDAZOLE (FLAGYL) IVPB 500 mg        500 mg 100 mL/hr over 60 Minutes Intravenous  Once 03/10/20 1108 03/10/20 1356   03/10/20 1115  vancomycin (VANCOCIN) IVPB 1000 mg/200 mL premix  Status:  Discontinued        1,000 mg 200 mL/hr over 60 Minutes Intravenous  Once 03/10/20 1108 03/10/20 1137      Medications: Scheduled Meds: . amiodarone  200 mg Oral BID  . ARIPiprazole  2 mg Oral Daily  . aspirin EC  81 mg Oral Q breakfast  . buPROPion  150 mg Oral Daily  . calcitRIOL      . calcitRIOL  0.25 mcg Oral Q M,W,F  . calcium acetate  2,001 mg Oral TID WC  . Chlorhexidine Gluconate Cloth  6 each Topical Daily  . [START ON 03/20/2020] diltiazem  360 mg Oral Daily  .  diltiazem  90 mg Oral Q6H  . feeding supplement (NEPRO CARB STEADY)  237 mL Oral BID BM  . insulin aspart  0-5 Units Subcutaneous QHS  . insulin aspart  0-6 Units Subcutaneous TID WC  . insulin aspart  4 Units Subcutaneous TID WC  . insulin detemir  28 Units Subcutaneous BID  . levETIRAcetam  250 mg Oral BID  . levothyroxine  50 mcg Oral QAC breakfast  . mouth rinse  15 mL Mouth Rinse BID  . metoprolol tartrate  100 mg Oral BID  . midodrine  5 mg Oral TID WC  . montelukast  10 mg Oral QHS  . multivitamin  1 tablet Oral QHS  . pantoprazole  40 mg Oral Daily  . predniSONE  10 mg Oral Q breakfast  . primidone  50 mg Oral Daily  . sodium chloride flush  10-40 mL Intracatheter Q12H  . Thrombi-Pad  1 each Topical Once  . traZODone  50 mg Oral QHS   Continuous Infusions: . sodium chloride 250 mL (03/17/20 1309)  . imipenem-cilastatin 500 mg (03/18/20 2201)  . phenylephrine (NEO-SYNEPHRINE) Adult infusion Stopped (03/12/20 1435)   PRN Meds:.acetaminophen, albuterol, calcium carbonate, diphenhydrAMINE, docusate sodium, EPINEPHrine, heparin, HYDROcodone-acetaminophen, metoprolol tartrate,  nitroGLYCERIN, ondansetron (ZOFRAN) IV, Resource ThickenUp Clear, sodium chloride flush    Objective: Weight change: -1.7 kg  Intake/Output Summary (Last 24 hours) at 03/19/2020 1313 Last data filed at 03/19/2020 0423 Gross per 24 hour  Intake 343 ml  Output 25 ml  Net 318 ml   Blood pressure 127/72, pulse 65, temperature 98.6 F (37 C), temperature source Oral, resp. rate 13, height 5\' 2"  (1.575 m), weight 87.8 kg, SpO2 97 %. Temp:  [97.8 F (36.6 C)-98.6 F (37 C)] 98.6 F (37 C) (08/04 0830) Pulse Rate:  [65-103] 65 (08/04 1130) Resp:  [10-24] 13 (08/04 1130) BP: (116-140)/(61-91) 127/72 (08/04 1130) SpO2:  [97 %-100 %] 97 % (08/04 1130) Weight:  [87.6 kg-87.8 kg] 87.8 kg (08/04 0830)  Physical Exam: General: Alert and awake, lying in bed getting HD HEENT: anicteric sclera, EOMI CVS irregularly irregular  Chest: , no wheezing, no respiratory distress Abdomen: soft non-distended,  Skin: Ecchymoses, central line out  CBC:    BMET Recent Labs    03/18/20 0727 03/19/20 0544  NA 138 136  K 4.1 4.3  CL 98 95*  CO2 29 29  GLUCOSE 69* 65*  BUN 35* 46*  CREATININE 3.28* 4.06*  CALCIUM 9.2 9.6     Liver Panel  Recent Labs    03/18/20 0727 03/19/20 0544  PROT 5.0*  --   ALBUMIN 2.8*  2.7* 2.7*  AST 38  --   ALT 487*  --   ALKPHOS 117  --   BILITOT 0.8  --   BILIDIR 0.2  --   IBILI 0.6  --        Sedimentation Rate No results for input(s): ESRSEDRATE in the last 72 hours. C-Reactive Protein No results for input(s): CRP in the last 72 hours.  Micro Results: Recent Results (from the past 720 hour(s))  SARS Coronavirus 2 by RT PCR (hospital order, performed in [facility] hospital lab) Nasopharyngeal Nasopharyngeal Swab     Status: None   Collection Time: 02/20/20  6:59 AM   Specimen: Nasopharyngeal Swab  Result Value Ref Range Status   SARS Coronavirus 2 NEGATIVE NEGATIVE Final    Comment: (NOTE) SARS-CoV-2 target nucleic acids are NOT  DETECTED.  The SARS-CoV-2 RNA is generally detectable in  upper and lower respiratory specimens during the acute phase of infection. The lowest concentration of SARS-CoV-2 viral copies this assay can detect is 250 copies / mL. A negative result does not preclude SARS-CoV-2 infection and should not be used as the sole basis for treatment or other patient management decisions.  A negative result may occur with improper specimen collection / handling, submission of specimen other than nasopharyngeal swab, presence of viral mutation(s) within the areas targeted by this assay, and inadequate number of viral copies (<250 copies / mL). A negative result must be combined with clinical observations, patient history, and epidemiological information.  Fact Sheet for Patients:   StrictlyIdeas.no  Fact Sheet for Healthcare Providers: BankingDealers.co.za  This test is not yet approved or  cleared by the [place] FDA and has been authorized for detection and/or diagnosis of SARS-CoV-2 by FDA under an Emergency Use Authorization (EUA).  This EUA will remain in effect (meaning this test can be used) for the duration of the COVID-19 declaration under Section 564(b)(1) of the Act, 21 U.S.C. section 360bbb-3(b)(1), unless the authorization is terminated or revoked sooner.  Performed at [facility], [place] [address] [place], [place] [address]   SARS Coronavirus 2 by RT PCR (hospital order, performed in [facility] hospital lab) Nasopharyngeal Nasopharyngeal Swab     Status: None   Collection Time: 03/07/20  1:13 AM   Specimen: Nasopharyngeal Swab  Result Value Ref Range Status   SARS Coronavirus 2 NEGATIVE NEGATIVE Final    Comment: (NOTE) SARS-CoV-2 target nucleic acids are NOT DETECTED.  The SARS-CoV-2 RNA is generally detectable in upper and lower respiratory specimens during the acute phase of infection. The lowest concentration of  SARS-CoV-2 viral copies this assay can detect is 250 copies / mL. A negative result does not preclude SARS-CoV-2 infection and should not be used as the sole basis for treatment or other patient management decisions.  A negative result may occur with improper specimen collection / handling, submission of specimen other than nasopharyngeal swab, presence of viral mutation(s) within the areas targeted by this assay, and inadequate number of viral copies (<250 copies / mL). A negative result must be combined with clinical observations, patient history, and epidemiological information.  Fact Sheet for Patients:   StrictlyIdeas.no  Fact Sheet for Healthcare Providers: BankingDealers.co.za  This test is not yet approved or  cleared by the [place] FDA and has been authorized for detection and/or diagnosis of SARS-CoV-2 by FDA under an Emergency Use Authorization (EUA).  This EUA will remain in effect (meaning this test can be used) for the duration of the COVID-19 declaration under Section 564(b)(1) of the Act, 21 U.S.C. section 360bbb-3(b)(1), unless the authorization is terminated or revoked sooner.  Performed at [facility], [address] [place],  [address]   SARS Coronavirus 2 by RT PCR (hospital order, performed in [facility] hospital lab) Nasopharyngeal Nasopharyngeal Swab     Status: None   Collection Time: 03/10/20 10:32 AM   Specimen: Nasopharyngeal Swab  Result Value Ref Range Status   SARS Coronavirus 2 NEGATIVE NEGATIVE Final    Comment: (NOTE) SARS-CoV-2 target nucleic acids are NOT DETECTED.  The SARS-CoV-2 RNA is generally detectable in upper and lower respiratory specimens during the acute phase of infection. The lowest concentration of SARS-CoV-2 viral copies this assay can detect is 250 copies / mL. A negative result does not preclude SARS-CoV-2 infection and should not be used as the sole basis for  treatment or  other patient management decisions.  A negative result may occur with improper specimen collection / handling, submission of specimen other than nasopharyngeal swab, presence of viral mutation(s) within the areas targeted by this assay, and inadequate number of viral copies (<250 copies / mL). A negative result must be combined with clinical observations, patient history, and epidemiological information.  Fact Sheet for Patients:   StrictlyIdeas.no  Fact Sheet for Healthcare Providers: BankingDealers.co.za  This test is not yet approved or  cleared by the [place] FDA and has been authorized for detection and/or diagnosis of SARS-CoV-2 by FDA under an Emergency Use Authorization (EUA).  This EUA will remain in effect (meaning this test can be used) for the duration of the COVID-19 declaration under Section 564(b)(1) of the Act, 21 U.S.C. section 360bbb-3(b)(1), unless the authorization is terminated or revoked sooner.  Performed at [facility], [place] [address] [place], [place] [address]   Blood Culture (routine x 2)     Status: Abnormal   Collection Time: 03/10/20 11:12 AM   Specimen: BLOOD  Result Value Ref Range Status   Specimen Description BLOOD LEFT ANTECUBITAL  Final   Special Requests   Final    BOTTLES DRAWN AEROBIC AND ANAEROBIC Blood Culture adequate volume   Culture  Setup Time   Final    GRAM POSITIVE COCCI IN CHAINS IN BOTH AEROBIC AND ANAEROBIC BOTTLES Organism ID to follow CRITICAL RESULT CALLED TO, READ BACK BY AND VERIFIED WITH: PHARMD VERANDA BRICK AT [place] ON 03/11/2020 Performed at [facility], [place] [address] [place], [place] [address]    Culture ENTEROCOCCUS FAECALIS (A)  Final   Report Status 03/12/2020 FINAL  Final   Organism ID, Bacteria ENTEROCOCCUS FAECALIS  Final      Susceptibility   Enterococcus faecalis - MIC*    AMPICILLIN <=2 SENSITIVE  Sensitive     VANCOMYCIN 1 SENSITIVE Sensitive     GENTAMICIN SYNERGY SENSITIVE Sensitive     * ENTEROCOCCUS FAECALIS  Blood Culture ID Panel (Reflexed)     Status: Abnormal   Collection Time: 03/10/20 11:12 AM  Result Value Ref Range Status   Enterococcus species DETECTED (A) NOT DETECTED Final    Comment: CRITICAL RESULT CALLED TO, READ BACK BY AND VERIFIED WITH: PHARMD VERANDA BRICK AT 0052 BY MESSAN HOUEGNIFIO ON 03/11/2020    Vancomycin resistance NOT DETECTED NOT DETECTED Final   Listeria monocytogenes NOT DETECTED NOT DETECTED Final   Staphylococcus species NOT DETECTED NOT DETECTED Final   Staphylococcus aureus (BCID) NOT DETECTED NOT DETECTED Final   Streptococcus species NOT DETECTED NOT DETECTED Final   Streptococcus agalactiae NOT DETECTED NOT DETECTED Final   Streptococcus pneumoniae NOT DETECTED NOT DETECTED Final   Streptococcus pyogenes NOT DETECTED NOT DETECTED Final   Acinetobacter baumannii NOT DETECTED NOT DETECTED Final   Enterobacteriaceae species NOT DETECTED NOT DETECTED Final   Enterobacter cloacae complex NOT DETECTED NOT DETECTED Final   Escherichia coli NOT DETECTED NOT DETECTED Final   Klebsiella oxytoca NOT DETECTED NOT DETECTED Final   Klebsiella pneumoniae NOT DETECTED NOT DETECTED Final   Proteus species NOT DETECTED NOT DETECTED Final   Serratia marcescens NOT DETECTED NOT DETECTED Final   Haemophilus influenzae NOT DETECTED NOT DETECTED Final   Neisseria meningitidis NOT DETECTED NOT DETECTED Final   Pseudomonas aeruginosa NOT DETECTED NOT DETECTED Final   Candida albicans NOT DETECTED NOT DETECTED Final   Candida glabrata NOT DETECTED NOT DETECTED Final   Candida krusei  NOT DETECTED NOT DETECTED Final   Candida parapsilosis NOT DETECTED NOT DETECTED Final   Candida tropicalis NOT DETECTED NOT DETECTED Final    Comment: Performed at [facility], [place] [address], [place], [place] [address]  Blood Culture (routine x 2)     Status: Abnormal    Collection Time: 03/10/20 11:35 AM   Specimen: BLOOD LEFT FOREARM  Result Value Ref Range Status   Specimen Description BLOOD LEFT FOREARM  Final   Special Requests   Final    BOTTLES DRAWN AEROBIC AND ANAEROBIC Blood Culture adequate volume   Culture  Setup Time   Final    GRAM POSITIVE COCCI IN CHAINS IN BOTH AEROBIC AND ANAEROBIC BOTTLES CRITICAL VALUE NOTED.  VALUE IS CONSISTENT WITH PREVIOUSLY REPORTED AND CALLED VALUE.    Culture (A)  Final    ENTEROCOCCUS FAECALIS SUSCEPTIBILITIES PERFORMED ON PREVIOUS CULTURE WITHIN THE LAST 5 DAYS. Performed at [facility], [place] [address], [place], [place] [address]    Report Status 03/12/2020 FINAL  Final  MRSA PCR Screening     Status: Abnormal   Collection Time: 03/10/20  6:08 PM   Specimen: Nasopharyngeal  Result Value Ref Range Status   MRSA by PCR (A) NEGATIVE Final    INVALID, UNABLE TO DETERMINE THE PRESENCE OF TARGET DUE TO SPECIMEN INTEGRITY. RECOLLECTION REQUESTED.    Comment: A,KOTEY @2145  03/10/20 EB Performed at [place] [address], [place], [place] [address]   MRSA PCR Screening     Status: None   Collection Time: 03/11/20  8:17 AM   Specimen: Nasal Mucosa; Nasopharyngeal  Result Value Ref Range Status   MRSA by PCR NEGATIVE NEGATIVE Final    Comment:        The GeneXpert MRSA Assay (FDA approved for NASAL specimens only), is one component of a comprehensive MRSA colonization surveillance program. It is not intended to diagnose MRSA infection nor to guide or monitor treatment for MRSA infections. Performed at [facility], [place] [address] [place], [place] [address]   Urine culture     Status: Abnormal   Collection Time: 03/11/20  9:17 AM   Specimen: In/Out Cath Urine  Result Value Ref Range Status   Specimen Description IN/OUT CATH URINE  Final   Special Requests   Final    NONE Performed at [facility], [place] [address], [place], [place] [address]    Culture MULTIPLE SPECIES  PRESENT, SUGGEST RECOLLECTION (A)  Final   Report Status 03/12/2020 FINAL  Final  Culture, blood (routine x 2)     Status: None   Collection Time: 03/12/20 12:13 PM   Specimen: BLOOD LEFT HAND  Result Value Ref Range Status   Specimen Description BLOOD LEFT HAND  Final   Special Requests   Final    BOTTLES DRAWN AEROBIC ONLY Blood Culture results may not be optimal due to an inadequate volume of blood received in culture bottles   Culture   Final    NO GROWTH 5 DAYS Performed at [facility], [place] [address] [place], [place] [address]    Report Status 03/17/2020 FINAL  Final  Culture, blood (routine x 2)     Status: None   Collection Time: 03/12/20  7:17 PM   Specimen: BLOOD LEFT HAND  Result Value Ref Range Status   Specimen Description BLOOD LEFT HAND  Final   Special Requests   Final    BOTTLES DRAWN AEROBIC ONLY  Blood Culture results may not be optimal due to an inadequate volume of blood received in culture bottles   Culture   Final    NO GROWTH 5 DAYS Performed at [facility] [address], [place], [place] [address]    Report Status 03/17/2020 FINAL  Final  Culture, blood (Routine X 2) w Reflex to ID Panel     Status: None   Collection Time: 03/14/20  5:59 PM   Specimen: BLOOD LEFT HAND  Result Value Ref Range Status   Specimen Description BLOOD LEFT HAND  Final   Special Requests   Final    BOTTLES DRAWN AEROBIC AND ANAEROBIC Blood Culture adequate volume   Culture   Final    NO GROWTH 5 DAYS Performed at [facility], [place] [address], [place], [place] [address]    Report Status 03/19/2020 FINAL  Final  Culture, blood (Routine X 2) w Reflex to ID Panel     Status: None   Collection Time: 03/14/20 10:09 PM   Specimen: BLOOD  Result Value Ref Range Status   Specimen Description BLOOD LEFT HAND  Final   Special Requests   Final    BOTTLES DRAWN AEROBIC ONLY Blood Culture adequate volume   Culture   Final    NO GROWTH 5 DAYS Performed at [facility], [place] [address] [place], [place] [address]    Report Status 03/19/2020 FINAL  Final  Culture, blood (Routine X 2) w Reflex to ID Panel     Status: Abnormal (Preliminary result)   Collection Time: 03/17/20  3:54 PM   Specimen: BLOOD LEFT ARM  Result Value Ref Range Status   Specimen Description BLOOD LEFT ARM  Final   Special Requests   Final    BOTTLES DRAWN AEROBIC ONLY Blood Culture adequate volume   Culture  Setup Time   Final    GRAM NEGATIVE RODS AEROBIC BOTTLE ONLY CRITICAL RESULT CALLED TO, READ BACK BY AND VERIFIED WITH: A. Rogers Blocker PharmD 8:45 03/18/20 ([email or web address]) Performed at [facility], [place] [address] [place], [place] [address]    Culture ESCHERICHIA COLI (A)  Final   Report Status PENDING  Incomplete  Culture, blood (Routine X 2) w Reflex to ID Panel     Status: None (Preliminary result)   Collection Time: 03/17/20  3:54 PM   Specimen: BLOOD LEFT ARM  Result Value Ref Range Status   Specimen Description BLOOD LEFT ARM  Final   Special Requests   Final    BOTTLES DRAWN AEROBIC ONLY Blood Culture adequate volume   Culture  Setup Time   Final    GRAM NEGATIVE RODS AEROBIC BOTTLE ONLY CRITICAL VALUE NOTED.  VALUE IS CONSISTENT WITH PREVIOUSLY REPORTED AND CALLED VALUE. Performed at [facility], [place] [address], [place], [place] [address]    Culture GRAM NEGATIVE RODS  Final   Report Status PENDING  Incomplete  Blood Culture ID Panel (Reflexed)     Status: Abnormal   Collection Time: 03/17/20  3:54 PM  Result Value Ref Range Status   Enterococcus faecalis NOT DETECTED NOT DETECTED Final   Enterococcus Faecium NOT DETECTED NOT DETECTED Final   Listeria monocytogenes NOT DETECTED NOT DETECTED Final   Staphylococcus species NOT DETECTED NOT DETECTED Final   Staphylococcus aureus (BCID) NOT DETECTED NOT DETECTED Final   Staphylococcus epidermidis NOT DETECTED NOT DETECTED Final   Staphylococcus lugdunensis NOT DETECTED NOT DETECTED Final    Streptococcus species  NOT DETECTED NOT DETECTED Final   Streptococcus agalactiae NOT DETECTED NOT DETECTED Final   Streptococcus pneumoniae NOT DETECTED NOT DETECTED Final   Streptococcus pyogenes NOT DETECTED NOT DETECTED Final   A.calcoaceticus-baumannii NOT DETECTED NOT DETECTED Final   Bacteroides fragilis NOT DETECTED NOT DETECTED Final   Enterobacterales DETECTED (A) NOT DETECTED Final    Comment: Enterobacterales represent a large order of gram negative bacteria, not a single organism. CRITICAL RESULT CALLED TO, READ BACK BY AND VERIFIED WITH: A. Rogers Blocker PharmD 8:45 03/18/20 ([email or web address])    Enterobacter cloacae complex NOT DETECTED NOT DETECTED Final   Escherichia coli DETECTED (A) NOT DETECTED Final    Comment: CRITICAL RESULT CALLED TO, READ BACK BY AND VERIFIED WITH: A. Rogers Blocker PharmD 8:45 03/18/20 ([email or web address])    Klebsiella aerogenes NOT DETECTED NOT DETECTED Final   Klebsiella oxytoca NOT DETECTED NOT DETECTED Final   Klebsiella pneumoniae NOT DETECTED NOT DETECTED Final   Proteus species NOT DETECTED NOT DETECTED Final   Salmonella species NOT DETECTED NOT DETECTED Final   Serratia marcescens NOT DETECTED NOT DETECTED Final   Haemophilus influenzae NOT DETECTED NOT DETECTED Final   Neisseria meningitidis NOT DETECTED NOT DETECTED Final   Pseudomonas aeruginosa NOT DETECTED NOT DETECTED Final   Stenotrophomonas maltophilia NOT DETECTED NOT DETECTED Final   Candida albicans NOT DETECTED NOT DETECTED Final   Candida auris NOT DETECTED NOT DETECTED Final   Candida glabrata NOT DETECTED NOT DETECTED Final   Candida krusei NOT DETECTED NOT DETECTED Final   Candida parapsilosis NOT DETECTED NOT DETECTED Final   Candida tropicalis NOT DETECTED NOT DETECTED Final   Cryptococcus neoformans/gattii NOT DETECTED NOT DETECTED Final   CTX-M ESBL DETECTED (A) NOT DETECTED Final    Comment: CRITICAL RESULT CALLED TO, READ BACK BY AND VERIFIED WITH: A. Rogers Blocker PharmD 8:45 03/18/20  ([email or web address]) (NOTE) Extended spectrum beta-lactamase detected. Recommend a carbapenem as initial therapy.      Carbapenem resistance IMP NOT DETECTED NOT DETECTED Final   Carbapenem resistance KPC NOT DETECTED NOT DETECTED Final   Carbapenem resistance NDM NOT DETECTED NOT DETECTED Final   Carbapenem resist OXA 48 LIKE NOT DETECTED NOT DETECTED Final   Carbapenem resistance VIM NOT DETECTED NOT DETECTED Final    Comment: Performed at [facility], [place] [address], [place], [place] [address]    Studies/Results: CT ABDOMEN PELVIS W CONTRAST  Result Date: 03/18/2020 CLINICAL DATA:  69 year old female with sepsis. EXAM: CT ABDOMEN AND PELVIS WITH CONTRAST TECHNIQUE: Multidetector CT imaging of the abdomen and pelvis was performed using the standard protocol following bolus administration of intravenous contrast. CONTRAST:  155mL OMNIPAQUE IOHEXOL 300 MG/ML  SOLN COMPARISON:  None. FINDINGS: Evaluation is limited due to streak artifact caused by patient's arms. Lower chest: There is a moderate size right pleural effusion with higher attenuating content suspicious for a serosanguineous or hemorrhagic fluid. Clinical correlation is recommended. There is consolidative changes of the visualized right lower lobe which may represent compressive atelectasis although infiltrate is not excluded. There is a small left pleural effusion with partial left lung base atelectasis. There is mild cardiomegaly. Advanced coronary vascular calcification. No intra-abdominal free air. Small perihepatic ascites. Hepatobiliary: The liver is unremarkable. No intrahepatic biliary ductal dilatation. Cholecystectomy. Pancreas: Unremarkable. No pancreatic ductal dilatation or surrounding inflammatory changes. Spleen: Normal in size without focal abnormality. Adrenals/Urinary Tract: The adrenal glands unremarkable. There is moderate bilateral renal parenchyma atrophy. There is no hydronephrosis on either side. The visualized ureters  and urinary bladder appear unremarkable. Stomach/Bowel:  There is sigmoid diverticulosis without active inflammatory changes. There is moderate stool throughout the colon. There is no bowel obstruction. Appendectomy. Vascular/Lymphatic: Mild aortoiliac atherosclerotic disease. The IVC is unremarkable. No portal venous gas. There is no adenopathy. Reproductive: The uterus is grossly unremarkable. Other: There is diffuse subcutaneous edema and anasarca. No fluid collection. Small fat containing umbilical hernia. Musculoskeletal: Degenerative changes of the spine. No acute osseous pathology. IMPRESSION: 1. Moderate size right pleural effusion with higher attenuating content suspicious for a serosanguineous or hemorrhagic fluid. Clinical correlation is recommended. There is compressive atelectasis of the right lower lobe. 2. Small left pleural effusion with partial left lung base atelectasis. 3. Sigmoid diverticulosis. No bowel obstruction. 4. Diffuse subcutaneous edema and anasarca. 5. Aortic Atherosclerosis (ICD10-I70.0). These results will be called to the ordering clinician or representative by the Radiologist Assistant, and communication documented in the PACS or [employer]. Electronically Signed   By: Anner Crete M.D.   On: 03/18/2020 19:32   DG Swallowing Func-Speech Pathology  Result Date: 03/18/2020 Objective Swallowing Evaluation: Type of Study: MBS-Modified Barium Swallow Study  Patient Details Name: ALLYANA VOGAN MRN: [id number] Date of Birth: July 22, 1951 Today's Date: 03/18/2020 Time: SLP Start Time (ACUTE ONLY): 1010 -SLP Stop Time (ACUTE ONLY): 1037 SLP Time Calculation (min) (ACUTE ONLY): 27 min Past Medical History: Past Medical History: Diagnosis Date . Anemia  . Angina pectoris ([place])  . Anxiety  . Asthma  . Atrial flutter ([place])  . Back pain  . Barrett esophagus  . Bifascicular block  . BMI 39.0-39.9,adult  . Cellulitis  . CHF (congestive heart failure) ([place])  . Chronic diastolic CHF  (congestive heart failure) ([place])  . Constipation  . Contact with and (suspected) exposure to covid-19  . COPD (chronic obstructive pulmonary disease) ([place])  . Dependence on renal dialysis ([place])  . Depression  . Dysphagia  . Dysuria  . Epilepsy ([place])  . ESRD on hemodialysis ([place])  . Essential hypertension  . Fibromyalgia  . Fibromyalgia  . GERD (gastroesophageal reflux disease)  . Gout  . History of pericarditis  . Hypercholesterolemia  . Hyperlipidemia  . Hypothyroid  . Hypothyroidism  . Insomnia  . Long term (current) use of insulin ([place])  . Major depressive disorder  . Memory loss  . Moderate protein-calorie malnutrition ([place])  . Morbid obesity ([place])  . Nephrolithiasis   2008 . Obesity hypoventilation syndrome (HCC)   CPAP . Obstructive sleep apnea  . Pain in left arm  . PE (pulmonary embolism)   2010 . Peripheral neuropathy  . Persistent atrial fibrillation ([place])  . Pneumonia  . Polyp of nasal sinus  . PTSD (post-traumatic stress disorder)  . Pulmonary hypertension ([place])  . Rheumatoid arteritis ([place])  . Rheumatoid arthritis ([place])  . Schizophrenia ([place])  . 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 Seizures ([place])  . Steroid dependence ([place])  . Tachycardia, unspecified  . Type 2 diabetes mellitus ([place])  . Unspecified intellectual disabilities  . Unspecified psychosis not due to a substance or known physiological condition ([place])  . Urinary tract infection  . Vitamin D deficiency  Past Surgical History: Past Surgical History: Procedure Laterality Date . A/V FISTULAGRAM N/A 01/28/2020  Procedure: A/V FISTULAGRAM - Right [place];  Surgeon: Waynetta Sandy, MD;  Location: [facility];  Service: Cardiovascular;  Laterality: N/A; . APPENDECTOMY   . AV FISTULA PLACEMENT Right 09/27/2019  Procedure: ARTERIOVENOUS (AV) FISTULA CREATION;  Surgeon: Waynetta Sandy, MD;  Location: [facility];  Service: Vascular;  Laterality:  Right; . AV FISTULA PLACEMENT Right 02/20/2020  Procedure: INSERTION OF ARTERIOVENOUS  (AV) GORE-TEX GRAFT ARM;  Surgeon: Waynetta Sandy, MD;  Location: [facility];  Service: Vascular;  Laterality: Right; . CHOLECYSTECTOMY   . COLONOSCOPY    benign polyps (12/2000), repeat colonoscopy performed in 2007 . ENDOMETRIAL BIOPSY    10/03 benign columnar mucosa (limited material) . INSERTION OF DIALYSIS CATHETER Right 09/14/2019  Procedure: ATTEMPTED INSERTION OF DIALYSIS CATHETER;  Surgeon: Virl Cagey, MD;  Location: [facility];  Service: General;  Laterality: Right; . INSERTION OF DIALYSIS CATHETER Right 09/27/2019  Procedure: INSERTION OF DIALYSIS CATHETER;  Surgeon: Waynetta Sandy, MD;  Location: [facility];  Service: Vascular;  Laterality: Right; . IR REMOVAL TUN CV CATH W/O FL  03/13/2020 . KIDNEY SURGERY   . REVISON OF ARTERIOVENOUS FISTULA Right 02/20/2020  Procedure: REVISON OF ARTERIOVENOUS FISTULA;  Surgeon: Waynetta Sandy, MD;  Location: [facility];  Service: Vascular;  Laterality: Right; HPI: 69 y.o. female, known to SLP services, with medical history significant for acute-on-chronic dysphagia, chronic hypoxemic respiratory failure secondary to underlying COPD-on 3 L of oxygen via nasal cannula, paroxysmal A. fib-on Eliquis, ESRD on hemodialysis (TTS), diastolic CHF, moderate AS, hypertension, hyperlipidemia, diabetes mellitus, obstructive sleep apnea/OHS, rheumatoid arthritis, seizure disorder, depression/anxiety, bipolar disorder presented to emergency department 7/26 with SOB and elevated blood glucose. Developed resp failure, transferred to ICU, on BIPAP, CRRT initiated; Dx sepsis secondary to RLL pna, afib with RVR, acute metabolic encephalopathy.  Pt has been followed by SLP during multiple admissions the last several years, with acute decompensations of swallow function.  She has had MBS studies, some of which showed deep penetration and/or aspiration of thin liquids, chin tuck being effective at one time.  Subjective: alert, communicative Assessment / Plan /  Recommendation CHL IP CLINICAL IMPRESSIONS 03/18/2020 Clinical Impression Pt presents with a mild oropharyngeal dysphagia. Oral deficits included prolonged mastication of solids, piecemeal swallowing, and oral residuals with solid PO; cleared reflexively with additional swallows. Pharyngeal deficits c/b intermittent reduced timing and efficiency of laryngeal vestibule closure allowing for episodic pre swallow laryngeal penetraiton of thin liquids (x1 with cup sip; transient in nature & during barium tablet series with penetrates remaining in laryngeal vestibule). No aspiration visualized within confines of study. Pt with clinical hx of overt difficulty per chart review. Recommend thin liquids and regular textures with meds in puree as clinically tolerated.   SLP Visit Diagnosis Dysphagia, oropharyngeal phase (R13.12) Attention and concentration deficit following -- Frontal lobe and executive function deficit following -- Impact on safety and function Mild aspiration risk   CHL IP TREATMENT RECOMMENDATION 03/18/2020 Treatment Recommendations Therapy as outlined in treatment plan below   Prognosis 03/18/2020 Prognosis for Safe Diet Advancement Good Barriers to Reach Goals -- Barriers/Prognosis Comment -- CHL IP DIET RECOMMENDATION 03/18/2020 SLP Diet Recommendations Regular solids;Thin liquid Liquid Administration via Cup Medication Administration Whole meds with puree Compensations Slow rate;Small sips/bites Postural Changes Seated upright at 90 degrees   CHL IP OTHER RECOMMENDATIONS 03/18/2020 Recommended Consults -- Oral Care Recommendations Oral care BID Other Recommendations --   CHL IP FOLLOW UP RECOMMENDATIONS 03/17/2020 Follow up Recommendations Other (comment)   CHL IP FREQUENCY AND DURATION 03/18/2020 Speech Therapy Frequency (ACUTE ONLY) min 2x/week Treatment Duration 2 weeks      CHL IP ORAL PHASE 03/18/2020 Oral Phase Impaired Oral - Pudding Teaspoon -- Oral - Pudding Cup -- Oral - Honey Teaspoon -- Oral - Honey Cup --  Oral - Nectar Teaspoon -- Oral -  Nectar Cup Delayed oral transit Oral - Nectar Straw -- Oral - Thin Teaspoon -- Oral - Thin Cup Delayed oral transit Oral - Thin Straw Delayed oral transit Oral - Puree WFL Oral - Mech Soft -- Oral - Regular Decreased bolus cohesion;Lingual/palatal residue;Piecemeal swallowing;Delayed oral transit Oral - Multi-Consistency -- Oral - Pill WFL Oral Phase - Comment --  CHL IP PHARYNGEAL PHASE 03/18/2020 Pharyngeal Phase Impaired Pharyngeal- Pudding Teaspoon -- Pharyngeal -- Pharyngeal- Pudding Cup -- Pharyngeal -- Pharyngeal- Honey Teaspoon -- Pharyngeal -- Pharyngeal- Honey Cup -- Pharyngeal -- Pharyngeal- Nectar Teaspoon -- Pharyngeal -- Pharyngeal- Nectar Cup Pharyngeal residue - valleculae;Delayed swallow initiation-pyriform sinuses Pharyngeal Material does not enter airway Pharyngeal- Nectar Straw -- Pharyngeal -- Pharyngeal- Thin Teaspoon -- Pharyngeal -- Pharyngeal- Thin Cup Reduced epiglottic inversion;Penetration/Aspiration before swallow;Delayed swallow initiation-pyriform sinuses;Pharyngeal residue - valleculae Pharyngeal Material enters airway, remains ABOVE vocal cords then ejected out;Material does not enter airway Pharyngeal- Thin Straw Delayed swallow initiation-pyriform sinuses Pharyngeal -- Pharyngeal- Puree WFL Pharyngeal -- Pharyngeal- Mechanical Soft -- Pharyngeal -- Pharyngeal- Regular Delayed swallow initiation-vallecula;Pharyngeal residue - valleculae Pharyngeal -- Pharyngeal- Multi-consistency -- Pharyngeal -- Pharyngeal- Pill Reduced epiglottic inversion;Delayed swallow initiation-pyriform sinuses;Penetration/Aspiration before swallow;Penetration/Aspiration during swallow Pharyngeal Material enters airway, remains ABOVE vocal cords and not ejected out Pharyngeal Comment --  CHL IP CERVICAL ESOPHAGEAL PHASE 03/18/2020 Cervical Esophageal Phase WFL Pudding Teaspoon -- Pudding Cup -- Honey Teaspoon -- Honey Cup -- Nectar Teaspoon -- Nectar Cup -- Nectar Straw -- Thin  Teaspoon -- Thin Cup -- Thin Straw -- Puree -- Mechanical Soft -- Regular -- Multi-consistency -- Pill -- Cervical Esophageal Comment -- Chelsea E Hartness MA, CCC-SLP 03/18/2020, 11:59 AM                 Assessment/Plan:  INTERVAL HISTORY: ESBL grewon repeat cultures and she has been broadened to Carbapenem therapy   Principal Problem:   Bacteremia Active Problems:   Type 2 diabetes mellitus with diabetic polyneuropathy, without long-term current use of insulin (HCC)   Diabetic peripheral neuropathy (HCC)   HYPERCHOLESTEROLEMIA   Gout, unspecified   Schizophrenia ([place])   Essential hypertension   Acute on chronic diastolic CHF (congestive heart failure) (HCC)   Hypothyroidism   ESRD (end stage renal disease) (HCC)   Elevated troponin   Acute on chronic respiratory failure with hypoxia (HCC)   Atrial fibrillation with RVR (HCC)   Macrocytic anemia   Thrombocytopenia (HCC)   Hyperkalemia   Septic shock (HCC)   Elevated liver enzymes   Palliative care by specialist   Pleural effusion    [name] is a 69 y.o. female with history of end-stage renal disease on hemodialysis, COPD admitted with sepsis and found to have Enterococcus faecalis bacteremia. He was critically ill on pressors and CRRT. Sepsis has resolved and hemodialysis catheter is out. She now is struggling atrial fibrillation with a rapid ventricular response.  She now has ESBL bacteremia  1. Enterococcal bacteremia  Central line is out! She is now on imipenem  She also needs a transesophageal echocardiogram when safe to do so  2 ESBL bacteremia: Would suspect her lines were the source of her bacteremia and they are now out.  CT the abdomen pelvis did show a large pleural effusion  3.  Large pleural effusion if she begins fever again would get diagnostic and therapeutic thoracentesis.    LOS: 9 days   Alcide Evener 03/19/2020, 1:13 PM

## 2020-03-19 NOTE — Progress Notes (Signed)
Radiology notified of moderate size plueral effusion in right lung.  TRH MD notified.

## 2020-03-19 NOTE — Progress Notes (Signed)
Consent filled out and placed in chart awaiting patient signature.  Patient waiting until providers speak with niece.

## 2020-03-20 ENCOUNTER — Other Ambulatory Visit (HOSPITAL_COMMUNITY): Payer: Medicaid Other

## 2020-03-20 ENCOUNTER — Encounter (HOSPITAL_COMMUNITY)
Admission: EM | Disposition: A | Discharge status: Skilled Nursing Facility | Payer: Self-pay | Source: Skilled Nursing Facility | Attending: Internal Medicine

## 2020-03-20 ENCOUNTER — Inpatient Hospital Stay (HOSPITAL_COMMUNITY): Payer: Medicare Other

## 2020-03-20 DIAGNOSIS — R0602 Shortness of breath: Secondary | ICD-10-CM | POA: Diagnosis not present

## 2020-03-20 DIAGNOSIS — A4181 Sepsis due to Enterococcus: Secondary | ICD-10-CM | POA: Diagnosis not present

## 2020-03-20 DIAGNOSIS — R7881 Bacteremia: Secondary | ICD-10-CM | POA: Diagnosis not present

## 2020-03-20 DIAGNOSIS — N186 End stage renal disease: Secondary | ICD-10-CM | POA: Diagnosis not present

## 2020-03-20 DIAGNOSIS — J9621 Acute and chronic respiratory failure with hypoxia: Secondary | ICD-10-CM | POA: Diagnosis not present

## 2020-03-20 DIAGNOSIS — I4891 Unspecified atrial fibrillation: Secondary | ICD-10-CM | POA: Diagnosis not present

## 2020-03-20 LAB — RENAL FUNCTION PANEL
Albumin: 2.6 g/dL — ABNORMAL LOW (ref 3.5–5.0)
Anion gap: 11 (ref 5–15)
BUN: 28 mg/dL — ABNORMAL HIGH (ref 8–23)
CO2: 29 mmol/L (ref 22–32)
Calcium: 9.2 mg/dL (ref 8.9–10.3)
Chloride: 95 mmol/L — ABNORMAL LOW (ref 98–111)
Creatinine, Ser: 2.92 mg/dL — ABNORMAL HIGH (ref 0.44–1.00)
GFR calc Af Amer: 18 mL/min — ABNORMAL LOW (ref >60)
GFR calc non Af Amer: 16 mL/min — ABNORMAL LOW (ref >60)
Glucose, Bld: 139 mg/dL — ABNORMAL HIGH (ref 70–99)
Phosphorus: 2.6 mg/dL (ref 2.5–4.6)
Potassium: 4 mmol/L (ref 3.5–5.1)
Sodium: 135 mmol/L (ref 135–145)

## 2020-03-20 LAB — CULTURE, BLOOD (ROUTINE X 2)
Special Requests: ADEQUATE
Special Requests: ADEQUATE

## 2020-03-20 LAB — GLUCOSE, CAPILLARY
Glucose-Capillary: 44 mg/dL — CL (ref 70–99)
Glucose-Capillary: 186 mg/dL — ABNORMAL HIGH (ref 70–99)
Glucose-Capillary: 139 mg/dL — ABNORMAL HIGH (ref 70–99)
Glucose-Capillary: 250 mg/dL — ABNORMAL HIGH (ref 70–99)
Glucose-Capillary: 193 mg/dL — ABNORMAL HIGH (ref 70–99)

## 2020-03-20 LAB — CBC
HCT: 30.2 % — ABNORMAL LOW (ref 36.0–46.0)
Hemoglobin: 9.8 g/dL — ABNORMAL LOW (ref 12.0–15.0)
MCH: 35.8 pg — ABNORMAL HIGH (ref 26.0–34.0)
MCHC: 32.5 g/dL (ref 30.0–36.0)
MCV: 110.2 fL — ABNORMAL HIGH (ref 80.0–100.0)
Platelets: 132 10*3/uL — ABNORMAL LOW (ref 150–400)
RBC: 2.74 MIL/uL — ABNORMAL LOW (ref 3.87–5.11)
RDW: 15.6 % — ABNORMAL HIGH (ref 11.5–15.5)
WBC: 13.7 10*3/uL — ABNORMAL HIGH (ref 4.0–10.5)
nRBC: 0 % (ref 0.0–0.2)

## 2020-03-20 SURGERY — ECHOCARDIOGRAM, TRANSESOPHAGEAL
Anesthesia: Monitor Anesthesia Care

## 2020-03-20 MED ORDER — DEXTROSE 50 % IV SOLN
25.0000 g | INTRAVENOUS | Status: AC
  Administered 2020-03-20: 25 g via INTRAVENOUS
  Filled 2020-03-20: qty 50

## 2020-03-20 MED ORDER — ENSURE ENLIVE PO LIQD
237.0000 mL | Freq: Two times a day (BID) | ORAL | Status: DC
  Administered 2020-03-23 – 2020-03-25 (×4): 237 mL via ORAL

## 2020-03-20 NOTE — Progress Notes (Signed)
Hypoglycemic Event  CBG: 44  Treatment: 25g D50  Symptoms: weakness  Follow-up CBG: Time: 0641 CBG Result 186  Possible Reasons for Event: poor PO intake  Comments/MD notified: protocol followed    [place]

## 2020-03-20 NOTE — Progress Notes (Signed)
  Speech Language Pathology Treatment: Dysphagia  Patient Details Name: [name] MRN: [id number] DOB: 1950-12-30 Today's Date: 03/20/2020 Time: 1464-3142 SLP Time Calculation (min) (ACUTE ONLY): 10 min  Assessment / Plan / Recommendation Clinical Impression  Pt was seen for dysphagia treatment. She was alert and cooperative throughout the session. She stated that she has been tolerating her meals well. Tai, RN denied observance of signs of aspiration with p.o. intake but stated that the meat today was especially hard and the pt could not masticate it due to her reduced dentition. Pt tolerated regular texture solids and thin liquids via cup without overt s/sx of aspiration and her swallow mechanism appeared within functional limits. It is recommended that the current diet be continued to allow pt more options for meals. However, her diet may be adjusted to soft if she begins to have significant difficulty with mastication of regular texture solids. Further skilled SLP services are not clinically indicated at this time.    HPI HPI: 69 y.o. female, known to SLP services, with medical history significant for acute-on-chronic dysphagia, chronic hypoxemic respiratory failure secondary to underlying COPD-on 3 L of oxygen via nasal cannula, paroxysmal A. fib-on Eliquis, ESRD on hemodialysis (TTS), diastolic CHF, moderate AS, hypertension, hyperlipidemia, diabetes mellitus, obstructive sleep apnea/OHS, rheumatoid arthritis, seizure disorder, depression/anxiety, bipolar disorder presented to emergency department 7/26 with SOB and elevated blood glucose. Developed resp failure, transferred to ICU, on BIPAP, CRRT initiated; Dx sepsis secondary to RLL pna, afib with RVR, acute metabolic encephalopathy.  Pt has been followed by SLP during multiple admissions the last several years, with acute decompensations of swallow function.  She has had MBS studies, some of which showed deep penetration and/or aspiration of  thin liquids, chin tuck being effective at one time.       SLP Plan  All goals met;Discharge SLP treatment due to (comment)       Recommendations  Diet recommendations: Regular;Thin liquid Liquids provided via: Cup;No straw Medication Administration: Whole meds with puree Supervision: Staff to assist with self feeding Compensations: Slow rate;Small sips/bites Postural Changes and/or Swallow Maneuvers: Seated upright 90 degrees                Oral Care Recommendations: Oral care BID Follow up Recommendations: None SLP Visit Diagnosis: Dysphagia, oropharyngeal phase (R13.12) Plan: All goals met;Discharge SLP treatment due to (comment)       [name], [place], [place] Office number [phone] Pager [place] 03/20/2020, 5:35 PM

## 2020-03-20 NOTE — Progress Notes (Signed)
Called the patient Niece again this am no answer will try again.[name] Kitchen

## 2020-03-20 NOTE — Progress Notes (Signed)
Inpatient Diabetes Program Recommendations  AACE/ADA: New Consensus Statement on Inpatient Glycemic Control (2015)  Target Ranges:  Prepandial:   less than 140 mg/dL      Peak postprandial:   less than 180 mg/dL (1-2 hours)      Critically ill patients:  140 - 180 mg/dL   Lab Results  Component Value Date   GLUCAP 186 (H) 03/20/2020   HGBA1C 8.0 (H) 03/10/2020    Review of Glycemic Control Results for [name], [name] (MRN [id number]) as of 03/20/2020 08:53  Ref. Range 03/19/2020 15:26 03/19/2020 17:02 03/19/2020 20:47 03/20/2020 06:07 03/20/2020 06:41  Glucose-Capillary Latest Ref Range: 70 - 99 mg/dL 179 (H) 191 (H) 260 (H) 44 (LL) 186 (H)   Inpatient Diabetes Program  Recommendations:   Fasting CBG this am 44 with Levemir 28 units bid -Decrease Levemir 22 units bid  Thank you, [name]. Charlene Detter, RN, MSN, CDE  Diabetes Coordinator Inpatient Glycemic Control Team Team Pager [phone] (8am-5pm) 03/20/2020 8:58 AM

## 2020-03-20 NOTE — Progress Notes (Signed)
Patient have episode of vomiting this am kidney MD came at the time, Zofran given . Will continue to monitor patient.

## 2020-03-20 NOTE — Progress Notes (Signed)
Patient ID: [name], female   DOB: 1951/02/03, 69 y.o.   MRN: [id number] S: "Not feeling good" this am and had several episodes of emesis. O:BP 132/86 (BP Location: Left Arm)   Pulse 94   Temp 98.1 F (36.7 C)   Resp 14   Ht 5\' 2"  (1.575 m)   Wt 86.2 kg   SpO2 99%   BMI 34.76 kg/m   Intake/Output Summary (Last 24 hours) at 03/20/2020 1008 Last data filed at 03/19/2020 1700 Gross per 24 hour  Intake 120 ml  Output 2300 ml  Net -2180 ml   Intake/Output: I/O last 3 completed shifts: In: 363 [P.O.:360; I.V.:3] Out: 2325 [Urine:25; Other:2300]  Intake/Output this shift:  No intake/output data recorded. Weight change: 0.2 kg Gen: obese WF in mild distress CVS: no rub Resp: diminished BS Abd: obese, NT Ext: no edema, RAVG +T/B  Recent Labs  Lab 03/14/20 0415 03/14/20 0415 03/14/20 1812 03/15/20 0500 03/16/20 0535 03/17/20 0328 03/18/20 0727 03/19/20 0544 03/20/20 0837  NA 140  --   --  138 136 138 138 136 135  K 3.9  --   --  3.9 4.0 3.6 4.1 4.3 4.0  CL 103  --   --  100 98 99 98 95* 95*  CO2 25  --   --  25 25 26 29 29 29   GLUCOSE 213*  --   --  326* 394* 151* 69* 65* 139*  BUN 49*  --   --  38* 62* 73* 35* 46* 28*  CREATININE 3.18*  --   --  2.99* 4.06* 4.78* 3.28* 4.06* 2.92*  ALBUMIN 2.8*   < > 2.8* 2.6* 2.8* 2.7* 2.8*  2.7* 2.7* 2.6*  CALCIUM 8.9  --   --  8.6* 9.0 9.4 9.2 9.6 9.2  PHOS 3.7  --   --  3.7 3.2 2.6 2.6 2.3* 2.6  AST  --   --  366* 181*  --   --  38  --   --   ALT  --   --  1,393* 1,118*  --   --  487*  --   --    < > = values in this interval not displayed.   Liver Function Tests: Recent Labs  Lab 03/14/20 1812 03/14/20 1812 03/15/20 0500 03/16/20 0535 03/18/20 0727 03/19/20 0544 03/20/20 0837  AST 366*  --  181*  --  38  --   --   ALT 1,393*  --  1,118*  --  487*  --   --   ALKPHOS 145*  --  129*  --  117  --   --   BILITOT 1.6*  --  1.2  --  0.8  --   --   PROT 4.9*  --  4.8*  --  5.0*  --   --   ALBUMIN 2.8*   < > 2.6*   < >  2.8*  2.7* 2.7* 2.6*   < > = values in this interval not displayed.   No results for input(s): LIPASE, AMYLASE in the last 168 hours. No results for input(s): AMMONIA in the last 168 hours. CBC: Recent Labs  Lab 03/16/20 0535 03/16/20 0535 03/17/20 0328 03/17/20 0328 03/18/20 0727 03/19/20 0544 03/20/20 0837  WBC 10.0   < > 11.0*   < > 12.9* 15.3* 13.7*  HGB 8.4*   < > 8.9*   < > 9.6* 9.3* 9.8*  HCT 26.8*   < > 27.6*   < >  30.6* 29.9* 30.2*  MCV 112.6*  --  111.7*  --  112.9* 112.4* 110.2*  PLT 74*   < > 89*   < > 95* 123* 132*   < > = values in this interval not displayed.   Cardiac Enzymes: No results for input(s): CKTOTAL, CKMB, CKMBINDEX, TROPONINI in the last 168 hours. CBG: Recent Labs  Lab 03/19/20 1526 03/19/20 1702 03/19/20 2047 03/20/20 0607 03/20/20 0641  GLUCAP 179* 191* 260* 44* 186*    Iron Studies: No results for input(s): IRON, TIBC, TRANSFERRIN, FERRITIN in the last 72 hours. Studies/Results: CT ABDOMEN PELVIS W CONTRAST  Result Date: 03/18/2020 CLINICAL DATA:  69 year old female with sepsis. EXAM: CT ABDOMEN AND PELVIS WITH CONTRAST TECHNIQUE: Multidetector CT imaging of the abdomen and pelvis was performed using the standard protocol following bolus administration of intravenous contrast. CONTRAST:  116mL OMNIPAQUE IOHEXOL 300 MG/ML  SOLN COMPARISON:  None. FINDINGS: Evaluation is limited due to streak artifact caused by patient's arms. Lower chest: There is a moderate size right pleural effusion with higher attenuating content suspicious for a serosanguineous or hemorrhagic fluid. Clinical correlation is recommended. There is consolidative changes of the visualized right lower lobe which may represent compressive atelectasis although infiltrate is not excluded. There is a small left pleural effusion with partial left lung base atelectasis. There is mild cardiomegaly. Advanced coronary vascular calcification. No intra-abdominal free air. Small perihepatic  ascites. Hepatobiliary: The liver is unremarkable. No intrahepatic biliary ductal dilatation. Cholecystectomy. Pancreas: Unremarkable. No pancreatic ductal dilatation or surrounding inflammatory changes. Spleen: Normal in size without focal abnormality. Adrenals/Urinary Tract: The adrenal glands unremarkable. There is moderate bilateral renal parenchyma atrophy. There is no hydronephrosis on either side. The visualized ureters and urinary bladder appear unremarkable. Stomach/Bowel: There is sigmoid diverticulosis without active inflammatory changes. There is moderate stool throughout the colon. There is no bowel obstruction. Appendectomy. Vascular/Lymphatic: Mild aortoiliac atherosclerotic disease. The IVC is unremarkable. No portal venous gas. There is no adenopathy. Reproductive: The uterus is grossly unremarkable. Other: There is diffuse subcutaneous edema and anasarca. No fluid collection. Small fat containing umbilical hernia. Musculoskeletal: Degenerative changes of the spine. No acute osseous pathology. IMPRESSION: 1. Moderate size right pleural effusion with higher attenuating content suspicious for a serosanguineous or hemorrhagic fluid. Clinical correlation is recommended. There is compressive atelectasis of the right lower lobe. 2. Small left pleural effusion with partial left lung base atelectasis. 3. Sigmoid diverticulosis. No bowel obstruction. 4. Diffuse subcutaneous edema and anasarca. 5. Aortic Atherosclerosis (ICD10-I70.0). These results will be called to the ordering clinician or representative by the Radiologist Assistant, and communication documented in the PACS or [employer]. Electronically Signed   By: Anner Crete M.D.   On: 03/18/2020 19:32   DG Swallowing Func-Speech Pathology  Result Date: 03/18/2020 Objective Swallowing Evaluation: Type of Study: MBS-Modified Barium Swallow Study  Patient Details Name: [name] MRN: [id number] Date of Birth: [birth year] Today's Date:  03/18/2020 Time: SLP Start Time (ACUTE ONLY): 1010 -SLP Stop Time (ACUTE ONLY): 1037 SLP Time Calculation (min) (ACUTE ONLY): 27 min Past Medical History: Past Medical History: Diagnosis Date . Anemia  . Angina pectoris ([place])  . Anxiety  . Asthma  . Atrial flutter ([place])  . Back pain  . Barrett esophagus  . Bifascicular block  . BMI 39.0-39.9,adult  . Cellulitis  . CHF (congestive heart failure) ([place])  . Chronic diastolic CHF (congestive heart failure) ([place])  . Constipation  . Contact with and (suspected) exposure to covid-19  . COPD (chronic  obstructive pulmonary disease) ([place])  . Dependence on renal dialysis ([place])  . Depression  . Dysphagia  . Dysuria  . Epilepsy ([place])  . ESRD on hemodialysis ([place])  . Essential hypertension  . Fibromyalgia  . Fibromyalgia  . GERD (gastroesophageal reflux disease)  . Gout  . History of pericarditis  . Hypercholesterolemia  . Hyperlipidemia  . Hypothyroid  . Hypothyroidism  . Insomnia  . Long term (current) use of insulin ([place])  . Major depressive disorder  . Memory loss  . Moderate protein-calorie malnutrition ([place])  . Morbid obesity ([place])  . Nephrolithiasis   2008 . Obesity hypoventilation syndrome ([facility])   CPAP . Obstructive sleep apnea  . Pain in left arm  . PE (pulmonary embolism)   2010 . Peripheral neuropathy  . Persistent atrial fibrillation ([place])  . Pneumonia  . Polyp of nasal sinus  . PTSD (post-traumatic stress disorder)  . Pulmonary hypertension ([place])  . Rheumatoid arteritis ([place])  . Rheumatoid arthritis ([place])  . Schizophrenia ([place])  . 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 Seizures ([place])  . Steroid dependence ([place])  . Tachycardia, unspecified  . Type 2 diabetes mellitus ([place])  . Unspecified intellectual disabilities  . Unspecified psychosis not due to a substance or known physiological condition ([place])  . Urinary tract infection  . Vitamin D deficiency  Past Surgical History: Past Surgical History: Procedure Laterality Date . A/V FISTULAGRAM N/A 01/28/2020  Procedure:  A/V FISTULAGRAM - Right [place];  Surgeon: Waynetta Sandy, MD;  Location: [facility];  Service: Cardiovascular;  Laterality: N/A; . APPENDECTOMY   . AV FISTULA PLACEMENT Right 09/27/2019  Procedure: ARTERIOVENOUS (AV) FISTULA CREATION;  Surgeon: Waynetta Sandy, MD;  Location: [facility];  Service: Vascular;  Laterality: Right; . AV FISTULA PLACEMENT Right 02/20/2020  Procedure: INSERTION OF ARTERIOVENOUS (AV) GORE-TEX GRAFT ARM;  Surgeon: Waynetta Sandy, MD;  Location: [facility];  Service: Vascular;  Laterality: Right; . CHOLECYSTECTOMY   . COLONOSCOPY    benign polyps (12/2000), repeat colonoscopy performed in 2007 . ENDOMETRIAL BIOPSY    10/03 benign columnar mucosa (limited material) . INSERTION OF DIALYSIS CATHETER Right 09/14/2019  Procedure: ATTEMPTED INSERTION OF DIALYSIS CATHETER;  Surgeon: Virl Cagey, MD;  Location: [facility];  Service: General;  Laterality: Right; . INSERTION OF DIALYSIS CATHETER Right 09/27/2019  Procedure: INSERTION OF DIALYSIS CATHETER;  Surgeon: Waynetta Sandy, MD;  Location: [facility];  Service: Vascular;  Laterality: Right; . IR REMOVAL TUN CV CATH W/O FL  03/13/2020 . KIDNEY SURGERY   . REVISON OF ARTERIOVENOUS FISTULA Right 02/20/2020  Procedure: REVISON OF ARTERIOVENOUS FISTULA;  Surgeon: Waynetta Sandy, MD;  Location: [facility];  Service: Vascular;  Laterality: Right; HPI: 69 y.o. female, known to SLP services, with medical history significant for acute-on-chronic dysphagia, chronic hypoxemic respiratory failure secondary to underlying COPD-on 3 L of oxygen via nasal cannula, paroxysmal A. fib-on Eliquis, ESRD on hemodialysis (TTS), diastolic CHF, moderate AS, hypertension, hyperlipidemia, diabetes mellitus, obstructive sleep apnea/OHS, rheumatoid arthritis, seizure disorder, depression/anxiety, bipolar disorder presented to emergency department 7/26 with SOB and elevated blood glucose. Developed resp failure, transferred to ICU, on BIPAP,  CRRT initiated; Dx sepsis secondary to RLL pna, afib with RVR, acute metabolic encephalopathy.  Pt has been followed by SLP during multiple admissions the last several years, with acute decompensations of swallow function.  She has had MBS studies, some of which showed deep penetration and/or aspiration of thin liquids, chin tuck being effective at one time.  Subjective:  alert, communicative Assessment / Plan / Recommendation CHL IP CLINICAL IMPRESSIONS 03/18/2020 Clinical Impression Pt presents with a mild oropharyngeal dysphagia. Oral deficits included prolonged mastication of solids, piecemeal swallowing, and oral residuals with solid PO; cleared reflexively with additional swallows. Pharyngeal deficits c/b intermittent reduced timing and efficiency of laryngeal vestibule closure allowing for episodic pre swallow laryngeal penetraiton of thin liquids (x1 with cup sip; transient in nature & during barium tablet series with penetrates remaining in laryngeal vestibule). No aspiration visualized within confines of study. Pt with clinical hx of overt difficulty per chart review. Recommend thin liquids and regular textures with meds in puree as clinically tolerated.   SLP Visit Diagnosis Dysphagia, oropharyngeal phase (R13.12) Attention and concentration deficit following -- Frontal lobe and executive function deficit following -- Impact on safety and function Mild aspiration risk   CHL IP TREATMENT RECOMMENDATION 03/18/2020 Treatment Recommendations Therapy as outlined in treatment plan below   Prognosis 03/18/2020 Prognosis for Safe Diet Advancement Good Barriers to Reach Goals -- Barriers/Prognosis Comment -- CHL IP DIET RECOMMENDATION 03/18/2020 SLP Diet Recommendations Regular solids;Thin liquid Liquid Administration via Cup Medication Administration Whole meds with puree Compensations Slow rate;Small sips/bites Postural Changes Seated upright at 90 degrees   CHL IP OTHER RECOMMENDATIONS 03/18/2020 Recommended Consults --  Oral Care Recommendations Oral care BID Other Recommendations --   CHL IP FOLLOW UP RECOMMENDATIONS 03/17/2020 Follow up Recommendations Other (comment)   CHL IP FREQUENCY AND DURATION 03/18/2020 Speech Therapy Frequency (ACUTE ONLY) min 2x/week Treatment Duration 2 weeks      CHL IP ORAL PHASE 03/18/2020 Oral Phase Impaired Oral - Pudding Teaspoon -- Oral - Pudding Cup -- Oral - Honey Teaspoon -- Oral - Honey Cup -- Oral - Nectar Teaspoon -- Oral - Nectar Cup Delayed oral transit Oral - Nectar Straw -- Oral - Thin Teaspoon -- Oral - Thin Cup Delayed oral transit Oral - Thin Straw Delayed oral transit Oral - Puree WFL Oral - Mech Soft -- Oral - Regular Decreased bolus cohesion;Lingual/palatal residue;Piecemeal swallowing;Delayed oral transit Oral - Multi-Consistency -- Oral - Pill WFL Oral Phase - Comment --  CHL IP PHARYNGEAL PHASE 03/18/2020 Pharyngeal Phase Impaired Pharyngeal- Pudding Teaspoon -- Pharyngeal -- Pharyngeal- Pudding Cup -- Pharyngeal -- Pharyngeal- Honey Teaspoon -- Pharyngeal -- Pharyngeal- Honey Cup -- Pharyngeal -- Pharyngeal- Nectar Teaspoon -- Pharyngeal -- Pharyngeal- Nectar Cup Pharyngeal residue - valleculae;Delayed swallow initiation-pyriform sinuses Pharyngeal Material does not enter airway Pharyngeal- Nectar Straw -- Pharyngeal -- Pharyngeal- Thin Teaspoon -- Pharyngeal -- Pharyngeal- Thin Cup Reduced epiglottic inversion;Penetration/Aspiration before swallow;Delayed swallow initiation-pyriform sinuses;Pharyngeal residue - valleculae Pharyngeal Material enters airway, remains ABOVE vocal cords then ejected out;Material does not enter airway Pharyngeal- Thin Straw Delayed swallow initiation-pyriform sinuses Pharyngeal -- Pharyngeal- Puree WFL Pharyngeal -- Pharyngeal- Mechanical Soft -- Pharyngeal -- Pharyngeal- Regular Delayed swallow initiation-vallecula;Pharyngeal residue - valleculae Pharyngeal -- Pharyngeal- Multi-consistency -- Pharyngeal -- Pharyngeal- Pill Reduced epiglottic  inversion;Delayed swallow initiation-pyriform sinuses;Penetration/Aspiration before swallow;Penetration/Aspiration during swallow Pharyngeal Material enters airway, remains ABOVE vocal cords and not ejected out Pharyngeal Comment --  CHL IP CERVICAL ESOPHAGEAL PHASE 03/18/2020 Cervical Esophageal Phase WFL Pudding Teaspoon -- Pudding Cup -- Honey Teaspoon -- Honey Cup -- Nectar Teaspoon -- Nectar Cup -- Nectar Straw -- Thin Teaspoon -- Thin Cup -- Thin Straw -- Puree -- Mechanical Soft -- Regular -- Multi-consistency -- Pill -- Cervical Esophageal Comment -- Chelsea E Hartness MA, CCC-SLP 03/18/2020, 11:59 AM              . amiodarone  200 mg Oral BID  . ARIPiprazole  2 mg Oral Daily  . aspirin EC  81 mg Oral Q breakfast  . buPROPion  150 mg Oral Daily  . calcitRIOL  0.25 mcg Oral Q M,W,F  . calcium acetate  2,001 mg Oral TID WC  . Chlorhexidine Gluconate Cloth  6 each Topical Daily  . diltiazem  360 mg Oral Daily  . feeding supplement (NEPRO CARB STEADY)  237 mL Oral BID BM  . insulin aspart  0-5 Units Subcutaneous QHS  . insulin aspart  0-6 Units Subcutaneous TID WC  . insulin aspart  4 Units Subcutaneous TID WC  . insulin detemir  28 Units Subcutaneous BID  . levETIRAcetam  250 mg Oral BID  . levothyroxine  50 mcg Oral QAC breakfast  . mouth rinse  15 mL Mouth Rinse BID  . metoprolol tartrate  100 mg Oral BID  . midodrine  5 mg Oral TID WC  . montelukast  10 mg Oral QHS  . multivitamin  1 tablet Oral QHS  . pantoprazole  40 mg Oral Daily  . predniSONE  10 mg Oral Q breakfast  . primidone  50 mg Oral Daily  . sodium chloride flush  10-40 mL Intracatheter Q12H  . Thrombi-Pad  1 each Topical Once  . traZODone  50 mg Oral QHS    BMET    Component Value Date/Time   NA 135 03/20/2020 0837   K 4.0 03/20/2020 0837   CL 95 (L) 03/20/2020 0837   CO2 29 03/20/2020 0837   GLUCOSE 139 (H) 03/20/2020 0837   BUN 28 (H) 03/20/2020 0837   CREATININE 2.92 (H) 03/20/2020 0837   CREATININE 2.54  (H) 08/02/2018 1411   CALCIUM 9.2 03/20/2020 0837   GFRNONAA 16 (L) 03/20/2020 0837   GFRAA 18 (L) 03/20/2020 0837   CBC    Component Value Date/Time   WBC 13.7 (H) 03/20/2020 0837   RBC 2.74 (L) 03/20/2020 0837   HGB 9.8 (L) 03/20/2020 0837   HCT 30.2 (L) 03/20/2020 0837   PLT 132 (L) 03/20/2020 0837   MCV 110.2 (H) 03/20/2020 0837   MCH 35.8 (H) 03/20/2020 0837   MCHC 32.5 03/20/2020 0837   RDW 15.6 (H) 03/20/2020 0837   LYMPHSABS 1.3 03/07/2020 0044   MONOABS 0.9 03/07/2020 0044   EOSABS 0.1 03/07/2020 0044   BASOSABS 0.0 03/07/2020 0044    [facility] HD Bath2/2.5, Dialyzerunknown, Heparindoes not appear to receive heparin with her treatment, only in the catheter. Accessusing TDC.  Assessment/Plan: 1. Enterococcal bacteremia with septic shock- resolvedbut will need TEE to r/o SBE.  Unfortunately pt not able to give informed consent and HCPOA has not returned any calls from cardiology. 2. ESRD- off schedule since transitioning from CRRT to IHD. Will get back on schedule later this week.HD on 03/19/20.  Will plan for HD on Saturday 03/22/20 to get back on her outpatient schedule. 3. Vascular access- TDC removed 7/29, using RAVG. 4. A fib with RVR- on amio. 5. Hypotension- improved with midodrine. 6. AMS- waxes and wanes. 7. Acute Hypoxic respiratory failure- currently on [place] 8. ABLA- from [facility] central line. S/p transfusion. Eliquis held and FFP given. 9. Thrombocytopenia- per primary. 10. Elevated ALT- improving. 11. N/V- new this morning.  Workup per primary. 12. Disposition- pt has not had significant improvement and remains confused.  Recommend palliative care consult to help set goals/limits of care as she is still a full code.  Donetta Potts,  MD [employer] [phone]

## 2020-03-20 NOTE — Progress Notes (Signed)
Nutrition Follow-up  DOCUMENTATION CODES:   Not applicable  INTERVENTION:   -Continue Magic cup BID with meals, each supplement provides 290 kcal and 9 grams of protein -Ensure Enlive po BID, each supplement provides 350 kcal and 20 grams of protein -D/c Nepro Shake po BID, each supplement provides 425 kcal and 19 grams protein -Continue renal MVI daily  NUTRITION DIAGNOSIS:   Increased nutrient needs related to acute illness as evidenced by estimated needs.  Ongoing  GOAL:   Patient will meet greater than or equal to 90% of their needs  Progressing   MONITOR:   PO intake, Supplement acceptance, Labs, Weight trends, Skin  REASON FOR ASSESSMENT:   Rounds    ASSESSMENT:   69 yo female admitted with septic shock secondary to enterococcal bacteremia, ESRD requiring CRRT. PMH includes ESRD on HD, DM, CHF, COPD, seizure disorder  7/28- s/p BSE- recommended regular diet with nectar thick liquids, CRRT d/c 7/29- transitioned to iHD, tunneled HD cath removed 8/1- central line d/c 8/2- s/p MBSS- advanced to regular diet with thin liquids  Reviewed I/O's: -2.2 L x 24 hours and +4.6 L since admission  Attempted to speak with pt via phone call to hospital room, however, no answer.   Pt with variable intake; noted meal completion 25-100%. Pt has since been advanced to thin liquids.   Pt awaiting TEE, however, need to obtain consent prior to procedure.   Medications reviewed and include phoslo, cardizem, and prednisone.   Labs reviewed: CBGS: 44-139 (inpatient orders for glycemic control are 0-6 units insulin aspart TID with meals, 4 units insulin aspart TID with meals, and 28 units insulin detemir BID).   Diet Order:   Diet Order            Diet renal with fluid restriction Fluid restriction: 1200 mL Fluid; Room service appropriate? Yes; Fluid consistency: Thin  Diet effective now                 EDUCATION NEEDS:   Not appropriate for education at this  time  Skin:  Skin Assessment: Skin Integrity Issues: Skin Integrity Issues:: Incisions Stage II: - Unstageable: lt heel Incisions: closed rt arm  Last BM:  03/18/20  Height:   Ht Readings from Last 1 Encounters:  03/12/20 5\' 2"  (1.575 m)    Weight:   Wt Readings from Last 1 Encounters:  03/20/20 86.2 kg   BMI:  Body mass index is 34.76 kg/m.  Estimated Nutritional Needs:   Kcal:  2000-2200 kcals  Protein:  100-115 g  Fluid:  1000 mL plus UOP    Loistine Chance, RD, LDN, [place] Registered Dietitian II Certified Diabetes Care and Education Specialist Please refer to [facility] for RD and/or RD on-call/weekend/after hours pager

## 2020-03-20 NOTE — Progress Notes (Deleted)
RT note. Pt. Not placed on BIPAP due to vomiting being a contraindication. RT will continue to monitor.

## 2020-03-20 NOTE — Progress Notes (Addendum)
Progress Note  Patient Name: [name] Date of Encounter: 03/20/2020  Primary Cardiologist: Rozann Lesches, MD  Subjective   Denies complaints, no CP or SOB, slow mentation, spacey gaze at times.  Inpatient Medications    Scheduled Meds: . amiodarone  200 mg Oral BID  . ARIPiprazole  2 mg Oral Daily  . aspirin EC  81 mg Oral Q breakfast  . buPROPion  150 mg Oral Daily  . calcitRIOL  0.25 mcg Oral Q M,W,F  . calcium acetate  2,001 mg Oral TID WC  . Chlorhexidine Gluconate Cloth  6 each Topical Daily  . diltiazem  360 mg Oral Daily  . feeding supplement (NEPRO CARB STEADY)  237 mL Oral BID BM  . insulin aspart  0-5 Units Subcutaneous QHS  . insulin aspart  0-6 Units Subcutaneous TID WC  . insulin aspart  4 Units Subcutaneous TID WC  . insulin detemir  28 Units Subcutaneous BID  . levETIRAcetam  250 mg Oral BID  . levothyroxine  50 mcg Oral QAC breakfast  . mouth rinse  15 mL Mouth Rinse BID  . metoprolol tartrate  100 mg Oral BID  . midodrine  5 mg Oral TID WC  . montelukast  10 mg Oral QHS  . multivitamin  1 tablet Oral QHS  . pantoprazole  40 mg Oral Daily  . predniSONE  10 mg Oral Q breakfast  . primidone  50 mg Oral Daily  . sodium chloride flush  10-40 mL Intracatheter Q12H  . Thrombi-Pad  1 each Topical Once  . traZODone  50 mg Oral QHS   Continuous Infusions: . sodium chloride 250 mL (03/17/20 1309)  . imipenem-cilastatin 500 mg (03/19/20 2158)  . phenylephrine (NEO-SYNEPHRINE) Adult infusion Stopped (03/12/20 1435)   PRN Meds: acetaminophen, albuterol, calcium carbonate, diphenhydrAMINE, docusate sodium, EPINEPHrine, heparin, HYDROcodone-acetaminophen, metoprolol tartrate, nitroGLYCERIN, ondansetron (ZOFRAN) IV, Resource ThickenUp Clear, sodium chloride flush   Vital Signs    Vitals:   03/19/20 1300 03/19/20 1955 03/19/20 2150 03/20/20 0408  BP: 114/73 108/61 118/67 132/86  Pulse: 94     Resp: 20 16  14   Temp: 97.8 F (36.6 C) 98.2 F (36.8 C)   98.1 F (36.7 C)  TempSrc: Oral Oral    SpO2: 94% 100%  99%  Weight:    86.2 kg  Height:        Intake/Output Summary (Last 24 hours) at 03/20/2020 0749 Last data filed at 03/19/2020 1700 Gross per 24 hour  Intake 120 ml  Output 2300 ml  Net -2180 ml   Last 3 Weights 03/20/2020 03/19/2020 03/19/2020  Weight (lbs) 190 lb 0.6 oz 188 lb 7.9 oz 193 lb 9 oz  Weight (kg) 86.2 kg 85.5 kg 87.8 kg     Telemetry    Atrial fib RVR 100-1teens- Personally Reviewed  Physical Exam   GEN: No acute distress. Chronically ill appearing. HEENT: Normocephalic, atraumatic, sclera non-icteric. Neck: No JVD or bruits. Cardiac: Irregularly irregular, no murmurs, rubs, or gallops.  Radials/DP/PT 1+ and equal bilaterally.  Respiratory: Clear to auscultation bilaterally. Breathing is unlabored. GI: Soft, nontender, non-distended, BS +x 4. MS: diffuse petechial rash on hands/arms Extremities: No clubbing or cyanosis. No edema. Distal pedal pulses are 2+ and equal bilaterally. Neuro:  A+o to self, 2021, [place]. Does not know month. Sometimes gazes off and fails to answer questions. Follows commands. Short brief answers only. Does not speak unless spoken to Psych: Flat affect  Labs    High Sensitivity  Troponin:   Recent Labs  Lab 03/10/20 1032 03/10/20 1323  TROPONINIHS 42* 56*      Cardiac EnzymesNo results for input(s): TROPONINI in the last 168 hours. No results for input(s): TROPIPOC in the last 168 hours.   Chemistry Recent Labs  Lab 03/14/20 1812 03/14/20 1812 03/15/20 0500 03/16/20 0535 03/17/20 0328 03/18/20 0727 03/19/20 0544  NA  --   --  138   < > 138 138 136  K  --   --  3.9   < > 3.6 4.1 4.3  CL  --   --  100   < > 99 98 95*  CO2  --   --  25   < > 26 29 29   GLUCOSE  --   --  326*   < > 151* 69* 65*  BUN  --   --  38*   < > 73* 35* 46*  CREATININE  --   --  2.99*   < > 4.78* 3.28* 4.06*  CALCIUM  --   --  8.6*   < > 9.4 9.2 9.6  PROT 4.9*  --  4.8*  --   --  5.0*  --     ALBUMIN 2.8*   < > 2.6*   < > 2.7* 2.8*  2.7* 2.7*  AST 366*  --  181*  --   --  38  --   ALT 1,393*  --  1,118*  --   --  487*  --   ALKPHOS 145*  --  129*  --   --  117  --   BILITOT 1.6*  --  1.2  --   --  0.8  --   GFRNONAA  --   --  15*   < > 9* 14* 11*  GFRAA  --   --  18*   < > 10* 16* 12*  ANIONGAP  --   --  13   < > 13 11 12    < > = values in this interval not displayed.     Hematology Recent Labs  Lab 03/17/20 0328 03/18/20 0727 03/19/20 0544  WBC 11.0* 12.9* 15.3*  RBC 2.47* 2.71* 2.66*  HGB 8.9* 9.6* 9.3*  HCT 27.6* 30.6* 29.9*  MCV 111.7* 112.9* 112.4*  MCH 36.0* 35.4* 35.0*  MCHC 32.2 31.4 31.1  RDW 15.9* 15.9* 15.9*  PLT 89* 95* 123*    BNPNo results for input(s): BNP, PROBNP in the last 168 hours.   DDimer No results for input(s): DDIMER in the last 168 hours.   Radiology    CT ABDOMEN PELVIS W CONTRAST  Result Date: 03/18/2020 CLINICAL DATA:  69 year old female with sepsis. EXAM: CT ABDOMEN AND PELVIS WITH CONTRAST TECHNIQUE: Multidetector CT imaging of the abdomen and pelvis was performed using the standard protocol following bolus administration of intravenous contrast. CONTRAST:  123mL OMNIPAQUE IOHEXOL 300 MG/ML  SOLN COMPARISON:  None. FINDINGS: Evaluation is limited due to streak artifact caused by patient's arms. Lower chest: There is a moderate size right pleural effusion with higher attenuating content suspicious for a serosanguineous or hemorrhagic fluid. Clinical correlation is recommended. There is consolidative changes of the visualized right lower lobe which may represent compressive atelectasis although infiltrate is not excluded. There is a small left pleural effusion with partial left lung base atelectasis. There is mild cardiomegaly. Advanced coronary vascular calcification. No intra-abdominal free air. Small perihepatic ascites. Hepatobiliary: The liver is unremarkable. No intrahepatic biliary ductal dilatation. Cholecystectomy. Pancreas:  Unremarkable. No pancreatic ductal dilatation or surrounding inflammatory changes. Spleen: Normal in size without focal abnormality. Adrenals/Urinary Tract: The adrenal glands unremarkable. There is moderate bilateral renal parenchyma atrophy. There is no hydronephrosis on either side. The visualized ureters and urinary bladder appear unremarkable. Stomach/Bowel: There is sigmoid diverticulosis without active inflammatory changes. There is moderate stool throughout the colon. There is no bowel obstruction. Appendectomy. Vascular/Lymphatic: Mild aortoiliac atherosclerotic disease. The IVC is unremarkable. No portal venous gas. There is no adenopathy. Reproductive: The uterus is grossly unremarkable. Other: There is diffuse subcutaneous edema and anasarca. No fluid collection. Small fat containing umbilical hernia. Musculoskeletal: Degenerative changes of the spine. No acute osseous pathology. IMPRESSION: 1. Moderate size right pleural effusion with higher attenuating content suspicious for a serosanguineous or hemorrhagic fluid. Clinical correlation is recommended. There is compressive atelectasis of the right lower lobe. 2. Small left pleural effusion with partial left lung base atelectasis. 3. Sigmoid diverticulosis. No bowel obstruction. 4. Diffuse subcutaneous edema and anasarca. 5. Aortic Atherosclerosis (ICD10-I70.0). These results will be called to the ordering clinician or representative by the Radiologist Assistant, and communication documented in the PACS or [employer]. Electronically Signed   By: Anner Crete M.D.   On: 03/18/2020 19:32   DG Swallowing Func-Speech Pathology  Result Date: 03/18/2020 Objective Swallowing Evaluation: Type of Study: MBS-Modified Barium Swallow Study  Patient Details Name: ANJOLI DIEMER MRN: [id number] Date of Birth: [birth year] Today's Date: 03/18/2020 Time: SLP Start Time (ACUTE ONLY): 1010 -SLP Stop Time (ACUTE ONLY): 1037 SLP Time Calculation (min) (ACUTE ONLY):  27 min Past Medical History: Past Medical History: Diagnosis Date . Anemia  . Angina pectoris ([place])  . Anxiety  . Asthma  . Atrial flutter ([place])  . Back pain  . Barrett esophagus  . Bifascicular block  . BMI 39.0-39.9,adult  . Cellulitis  . CHF (congestive heart failure) ([place])  . Chronic diastolic CHF (congestive heart failure) ([place])  . Constipation  . Contact with and (suspected) exposure to covid-19  . COPD (chronic obstructive pulmonary disease) ([place])  . Dependence on renal dialysis ([place])  . Depression  . Dysphagia  . Dysuria  . Epilepsy ([place])  . ESRD on hemodialysis ([place])  . Essential hypertension  . Fibromyalgia  . Fibromyalgia  . GERD (gastroesophageal reflux disease)  . Gout  . History of pericarditis  . Hypercholesterolemia  . Hyperlipidemia  . Hypothyroid  . Hypothyroidism  . Insomnia  . Long term (current) use of insulin ([place])  . Major depressive disorder  . Memory loss  . Moderate protein-calorie malnutrition ([place])  . Morbid obesity ([place])  . Nephrolithiasis   2008 . Obesity hypoventilation syndrome ([facility])   CPAP . Obstructive sleep apnea  . Pain in left arm  . PE (pulmonary embolism)   2010 . Peripheral neuropathy  . Persistent atrial fibrillation ([place])  . Pneumonia  . Polyp of nasal sinus  . PTSD (post-traumatic stress disorder)  . Pulmonary hypertension ([place])  . Rheumatoid arteritis ([place])  . Rheumatoid arthritis ([place])  . Schizophrenia ([place])  . 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 Seizures ([place])  . Steroid dependence ([place])  . Tachycardia, unspecified  . Type 2 diabetes mellitus ([place])  . Unspecified intellectual disabilities  . Unspecified psychosis not due to a substance or known physiological condition ([place])  . Urinary tract infection  . Vitamin D deficiency  Past Surgical History: Past Surgical History: Procedure Laterality Date . A/V FISTULAGRAM N/A 01/28/2020  Procedure: A/V FISTULAGRAM - Right [place];  Surgeon: Servando Snare  Harrell Gave, MD;  Location: [facility];  Service:  Cardiovascular;  Laterality: N/A; . APPENDECTOMY   . AV FISTULA PLACEMENT Right 09/27/2019  Procedure: ARTERIOVENOUS (AV) FISTULA CREATION;  Surgeon: Waynetta Sandy, MD;  Location: [facility];  Service: Vascular;  Laterality: Right; . AV FISTULA PLACEMENT Right 02/20/2020  Procedure: INSERTION OF ARTERIOVENOUS (AV) GORE-TEX GRAFT ARM;  Surgeon: Waynetta Sandy, MD;  Location: [facility];  Service: Vascular;  Laterality: Right; . CHOLECYSTECTOMY   . COLONOSCOPY    benign polyps (12/2000), repeat colonoscopy performed in 2007 . ENDOMETRIAL BIOPSY    10/03 benign columnar mucosa (limited material) . INSERTION OF DIALYSIS CATHETER Right 09/14/2019  Procedure: ATTEMPTED INSERTION OF DIALYSIS CATHETER;  Surgeon: Virl Cagey, MD;  Location: [facility];  Service: General;  Laterality: Right; . INSERTION OF DIALYSIS CATHETER Right 09/27/2019  Procedure: INSERTION OF DIALYSIS CATHETER;  Surgeon: Waynetta Sandy, MD;  Location: [facility];  Service: Vascular;  Laterality: Right; . IR REMOVAL TUN CV CATH W/O FL  03/13/2020 . KIDNEY SURGERY   . REVISON OF ARTERIOVENOUS FISTULA Right 02/20/2020  Procedure: REVISON OF ARTERIOVENOUS FISTULA;  Surgeon: Waynetta Sandy, MD;  Location: [facility];  Service: Vascular;  Laterality: Right; HPI: 69 y.o. female, known to SLP services, with medical history significant for acute-on-chronic dysphagia, chronic hypoxemic respiratory failure secondary to underlying COPD-on 3 L of oxygen via nasal cannula, paroxysmal A. fib-on Eliquis, ESRD on hemodialysis (TTS), diastolic CHF, moderate AS, hypertension, hyperlipidemia, diabetes mellitus, obstructive sleep apnea/OHS, rheumatoid arthritis, seizure disorder, depression/anxiety, bipolar disorder presented to emergency department 7/26 with SOB and elevated blood glucose. Developed resp failure, transferred to ICU, on BIPAP, CRRT initiated; Dx sepsis secondary to RLL pna, afib with RVR, acute metabolic encephalopathy.  Pt has been  followed by SLP during multiple admissions the last several years, with acute decompensations of swallow function.  She has had MBS studies, some of which showed deep penetration and/or aspiration of thin liquids, chin tuck being effective at one time.  Subjective: alert, communicative Assessment / Plan / Recommendation CHL IP CLINICAL IMPRESSIONS 03/18/2020 Clinical Impression Pt presents with a mild oropharyngeal dysphagia. Oral deficits included prolonged mastication of solids, piecemeal swallowing, and oral residuals with solid PO; cleared reflexively with additional swallows. Pharyngeal deficits c/b intermittent reduced timing and efficiency of laryngeal vestibule closure allowing for episodic pre swallow laryngeal penetraiton of thin liquids (x1 with cup sip; transient in nature & during barium tablet series with penetrates remaining in laryngeal vestibule). No aspiration visualized within confines of study. Pt with clinical hx of overt difficulty per chart review. Recommend thin liquids and regular textures with meds in puree as clinically tolerated.   SLP Visit Diagnosis Dysphagia, oropharyngeal phase (R13.12) Attention and concentration deficit following -- Frontal lobe and executive function deficit following -- Impact on safety and function Mild aspiration risk   CHL IP TREATMENT RECOMMENDATION 03/18/2020 Treatment Recommendations Therapy as outlined in treatment plan below   Prognosis 03/18/2020 Prognosis for Safe Diet Advancement Good Barriers to Reach Goals -- Barriers/Prognosis Comment -- CHL IP DIET RECOMMENDATION 03/18/2020 SLP Diet Recommendations Regular solids;Thin liquid Liquid Administration via Cup Medication Administration Whole meds with puree Compensations Slow rate;Small sips/bites Postural Changes Seated upright at 90 degrees   CHL IP OTHER RECOMMENDATIONS 03/18/2020 Recommended Consults -- Oral Care Recommendations Oral care BID Other Recommendations --   CHL IP FOLLOW UP RECOMMENDATIONS 03/17/2020  Follow up Recommendations Other (comment)   CHL IP FREQUENCY AND DURATION 03/18/2020 Speech Therapy Frequency (ACUTE ONLY)  min 2x/week Treatment Duration 2 weeks      CHL IP ORAL PHASE 03/18/2020 Oral Phase Impaired Oral - Pudding Teaspoon -- Oral - Pudding Cup -- Oral - Honey Teaspoon -- Oral - Honey Cup -- Oral - Nectar Teaspoon -- Oral - Nectar Cup Delayed oral transit Oral - Nectar Straw -- Oral - Thin Teaspoon -- Oral - Thin Cup Delayed oral transit Oral - Thin Straw Delayed oral transit Oral - Puree WFL Oral - Mech Soft -- Oral - Regular Decreased bolus cohesion;Lingual/palatal residue;Piecemeal swallowing;Delayed oral transit Oral - Multi-Consistency -- Oral - Pill WFL Oral Phase - Comment --  CHL IP PHARYNGEAL PHASE 03/18/2020 Pharyngeal Phase Impaired Pharyngeal- Pudding Teaspoon -- Pharyngeal -- Pharyngeal- Pudding Cup -- Pharyngeal -- Pharyngeal- Honey Teaspoon -- Pharyngeal -- Pharyngeal- Honey Cup -- Pharyngeal -- Pharyngeal- Nectar Teaspoon -- Pharyngeal -- Pharyngeal- Nectar Cup Pharyngeal residue - valleculae;Delayed swallow initiation-pyriform sinuses Pharyngeal Material does not enter airway Pharyngeal- Nectar Straw -- Pharyngeal -- Pharyngeal- Thin Teaspoon -- Pharyngeal -- Pharyngeal- Thin Cup Reduced epiglottic inversion;Penetration/Aspiration before swallow;Delayed swallow initiation-pyriform sinuses;Pharyngeal residue - valleculae Pharyngeal Material enters airway, remains ABOVE vocal cords then ejected out;Material does not enter airway Pharyngeal- Thin Straw Delayed swallow initiation-pyriform sinuses Pharyngeal -- Pharyngeal- Puree WFL Pharyngeal -- Pharyngeal- Mechanical Soft -- Pharyngeal -- Pharyngeal- Regular Delayed swallow initiation-vallecula;Pharyngeal residue - valleculae Pharyngeal -- Pharyngeal- Multi-consistency -- Pharyngeal -- Pharyngeal- Pill Reduced epiglottic inversion;Delayed swallow initiation-pyriform sinuses;Penetration/Aspiration before swallow;Penetration/Aspiration during  swallow Pharyngeal Material enters airway, remains ABOVE vocal cords and not ejected out Pharyngeal Comment --  CHL IP CERVICAL ESOPHAGEAL PHASE 03/18/2020 Cervical Esophageal Phase WFL Pudding Teaspoon -- Pudding Cup -- Honey Teaspoon -- Honey Cup -- Nectar Teaspoon -- Nectar Cup -- Nectar Straw -- Thin Teaspoon -- Thin Cup -- Thin Straw -- Puree -- Mechanical Soft -- Regular -- Multi-consistency -- Pill -- Cervical Esophageal Comment -- Chelsea E Hartness MA, CCC-SLP 03/18/2020, 11:59 AM               Cardiac Studies   2D echo 03/05/20 1. Challenging to estimate EF with underlying tachycardia, however does  not appear severly reduced.  2. Left ventricular ejection fraction, by estimation, is 45 to 50%. The  left ventricle has mildly decreased function. The left ventricle  demonstrates global hypokinesis. Left ventricular diastolic parameters are  indeterminate.  3. Right ventricular systolic function is mildly reduced. The right  ventricular size is moderately enlarged. There is moderately elevated  pulmonary artery systolic pressure. The estimated right ventricular  systolic pressure is 39.7 mmHg.  4. Left atrial size was severely dilated.  5. Right atrial size was moderately dilated.  6. The mitral valve is normal in structure. Mild mitral valve  regurgitation. No evidence of mitral stenosis.  7. Tricuspid valve regurgitation is moderate to severe.  8. The aortic valve is tricuspid. Aortic valve regurgitation is not  visualized. Mild to moderate aortic valve sclerosis/calcification is  present, without any evidence of aortic stenosis.  9. The inferior vena cava is dilated in size with <50% respiratory  variability, suggesting right atrial pressure of 15 mmHg  Patient Profile     69 y.o. female chronic diastolic HF, persistent atrial fibrillation/flutter on anticoagulation with Eliquis, anemia, mild to moderate aortic stenosis (only sclerosis seen this admission), ESRD on HD, HTN,  HLD, bifascicular block(known RBBB+LAFB), COPD, DM 2, history of PE, seizure disorder, schizophrenia, Barrett's esophagus, memory loss and OSAwho was admitted with septic shock due to enterococcus faecalis, RLL PNA. Cardiology following for  atrial fib RVR. 2D Echo was challenging to estimate EF due to tachycardia but showing LVEF 45-50%, global HK, mildly reduced RV function, moderately elevated PASP, LA severely dilated, moderately dilated RA, moderate-severe TR, moderate-severe aortic sclerosis without stenosis. Also with ESBL E. Coli bacteremia on 03/17/20 blood cultures. ID feels lines were likely source of bacteremia. Other hospital issues include bleeding subclavian line, thrombocytopenia requiring 1 unit of platelets and 2 units FFP, holding of anticoagulation, acute on chronic respiratory failure, acute metabolic/toxic encephalopathy, anemia, transaminitis due to shock liver, elevated troponins, hyperkalemia, moderate right pleural effusion suspicious for serosanguinous or hemorrhagic fluid, and stage II pressure ulcer on heel. ID also following given speciation who has recommended TEE when stable.   Assessment & Plan    1. Septic shock due to bacteremia (Enteroccocus faecalis on initial cultures, then ESBL bacteremia) with other issues noted above - patient is non-consentable due to mental status issues, have been unable to reach niece despite trying multiple times yesterday and again today x 4 (voicemail box full), another APP has also tried multiple times without answer - also see palliative care consult has been called along with transition of care team to help identify/contact who can definitively help establish medical decisions for this patient - will cancel TEE for today and revisit when more clarity about goals of care and who is guardian  2. Persistent atrial fibrillation in context of numerous metabolic stressors - TSH wnl - not currently a candidate for DCCV given intermittent  holding of Eliquis this admission due to above issues, can revisit when consistently clinically stable - notes indicate deferral to cardiology of when to resume Eliquis - was on heparin up until 7/28 but has not been anticoagulated since that time except for aspirin. Petechial/purpural appearance to hands noted, will review anticoag recs with MD, also with question of pleural effusion possibly hemorrhagic as below - rates remain suboptimally controlled which were also present as outpatient - did not get a PM dose of diltiazem though, which may be contributing - agree with diltiazem CD 360mg , Lopressor 100mg  BID, amiodarone 200mg  BID - QT appears prolonged but challenging given lack of other clinical alternatives and also underlying BBB - will ask MD to review regimen - limited options otherwise - poor candidate for digoxin due to ESRD and not a candidate for more aggressive intervention like AVN ablation + PPM due to infective issues - anticipate rates will only be optimally controlled when medical issues calm down - severely dilated LA so may not even be able to hold sinus even if pursued  3. Acute combined CHF (possible mild reduction in LV dysfunction in setting of tachycardia) - blood pressure OK - still requiring O2 - volume removal is per HD/nephrology  4. Pleural effusion, right - higher attenuating content suspicious for a serosanguineous or hemorrhagic fluid. - per primary team  5. Mildly elevated troponin 42->56 - not felt to reflect ACS, likely demand process  For questions or updates, please contact [place] Please consult [email or web address] for contact info under Cardiology/STEMI.  Signed, Charlie Pitter, PA-C 03/20/2020, 7:49 AM     Patient seen and examined. Agree with assessment and plan.  Unable to obtain consent for TEE despite multiple attempts at contacting power of attorney.  With very severe left atrial dilatation unlikely she would maintain sinus rhythm if she was  converted back to sinus rhythm.  Consider discontinuance of amiodarone and continue with rate control of A. Fib.  AF rate now approximately 115 bpm.  With prolonged A. fib, consider initiation of heparin if felt to be stable for anticoagulation.   Troy Sine, MD, [facility] 03/20/2020 11:38 AM

## 2020-03-20 NOTE — Progress Notes (Signed)
OT Cancellation Note  Patient Details Name: [name] MRN: [id number] DOB: Oct 26, 1950   Cancelled Treatment:    Reason Eval/Treat Not Completed: Fatigue/lethargy limiting ability to participate Pt had bouts of emesis and nausea this morning, and pt had just been assisted up to the chair via nursing and eating. Will check back as time allows.  Corinne [place], [place] [phone] [place] 03/20/2020, 1:48 PM

## 2020-03-20 NOTE — Progress Notes (Signed)
Physical Therapy Treatment Patient Details Name: [name] MRN: [id number] DOB: [birth year] Today's Date: 03/20/2020    History of Present Illness 69 yo admitted from SNF with SOB and AFib with RVR, decompensated heart failure and septic shock with metabolic encephalopathy. PMHx: CHF, AFib, ESRD, RA, Schizophrenia    PT Comments    Pt was more alert after eating a few bites of food, and motivated to try to stand.  After mult practices standing, asked to get to [facility].  Used walker to stand but did direct assist to [facility] due to her difficulty with hand placement and releasing hands to move.  Pt is max assist, nearly total assist to [facility] but did not have a second pair of hands to do this.  Recommend her to use two person help with RW next visit.   Follow Up Recommendations  SNF     Equipment Recommendations  None recommended by PT    Recommendations for Other Services       Precautions / Restrictions Precautions Precautions: Fall Precaution Comments: watch HR Restrictions Weight Bearing Restrictions: No    Mobility  Bed Mobility Overal bed mobility: Needs Assistance             General bed mobility comments: up in chair when PT arrived  Transfers Overall transfer level: Needs assistance Equipment used: Rolling walker (2 wheeled);1 person hand held assist Transfers: Sit to/from [employer] Transfers Sit to Stand: Max assist Stand pivot transfers: Total assist;Max assist       General transfer comment: pt is quite weak and requires max assist to launch standing  Ambulation/Gait             General Gait Details: did not walk due to weakness   Stairs             Wheelchair Mobility    Modified Rankin (Stroke Patients Only)       Balance     Sitting balance-Leahy Scale: Fair       Standing balance-Leahy Scale: Poor                              Cognition Arousal/Alertness: Awake/alert Behavior During Therapy: Flat  affect Overall Cognitive Status: Difficult to assess                                 General Comments: minimal verbalizations      Exercises General Exercises - Lower Extremity Ankle Circles/Pumps: AROM;5 reps Long Arc Quad: AROM;10 reps Heel Slides: AROM;10 reps Hip ABduction/ADduction: AROM;10 reps    General Comments        Pertinent Vitals/Pain Pain Assessment: No/denies pain    Home Living                      Prior Function            PT Goals (current goals can now be found in the care plan section) Acute Rehab PT Goals Patient Stated Goal: go back to rehab Progress towards PT goals: Progressing toward goals    Frequency    Min 2X/week      PT Plan Current plan remains appropriate    Co-evaluation              AM-PAC PT "6 Clicks" Mobility   Outcome Measure  Help needed turning from your back to your side  while in a flat bed without using bedrails?: A Lot Help needed moving from lying on your back to sitting on the side of a flat bed without using bedrails?: A Lot Help needed moving to and from a bed to a chair (including a wheelchair)?: A Lot Help needed standing up from a chair using your arms (e.g., wheelchair or bedside chair)?: A Lot Help needed to walk in hospital room?: Total Help needed climbing 3-5 steps with a railing? : Total 6 Click Score: 10    End of Session Equipment Utilized During Treatment: Gait belt Activity Tolerance: Patient tolerated treatment well Patient left: with call bell/phone within reach;in chair;with chair alarm set Nurse Communication: Mobility status PT Visit Diagnosis: Other abnormalities of gait and mobility (R26.89);Muscle weakness (generalized) (M62.81)     Time: 7915-0413 PT Time Calculation (min) (ACUTE ONLY): 27 min  Charges:  $Therapeutic Exercise: 8-22 mins $Therapeutic Activity: 8-22 mins                    Ramond Dial 03/20/2020, 3:07 PM  Mee Hives, PT [place] Acute  Rehab Dept. Number: [place] [facility] [place]

## 2020-03-20 NOTE — Progress Notes (Signed)
PROGRESS NOTE    [name]  ZOX:[id number] DOB: 04/06/1951 DOA: 03/10/2020 PCP: Glenda Chroman, MD   Brief Narrative:   Kathleen Boop Archeris an 69 y.o.femalepast medical history of chronic respiratory failure with hypoxia secondary to underlying COPD on 3 L of oxygen, A. fib on Eliquis end-stage renal disease on hemodialysis chronic diastolic heart failure moderate AS, who came into the hospital for hyperglycemia and tachycardia was found in A. fib with RVR, decompensated systolic heart failure and septic shock who presented to the ED with atrial fibrillation and RVR with a heart rate of 170 was started on a diltiazem drip on arrival to the ED heart rate improved however she developed progressive hypotension and was transitioned to amiodarone due to hypotension PCCM was consulted and started on pressors for septic shock due to right lower lobe pneumonia.  8/5: C/o nausea/vomiting this AM. No stools noted. Check KUB. Awaiting TEE. Unable to get consent. Continue primaxin.    Assessment & Plan: Septic shock due to Enterococcus faecalis bacteremia probably due to her HD catheter E coli bacteremia     - ID was consulted he was sensitive to Vanco, then switch to Zyvox, then ampicillin now on imipenem due to blood cultures growing ESBL E. coli.     - TEE pending     - Central line discontinued on 03/16/2020.     - Tunnel catheter successfully removed on 03/13/2020.     - Bld Cx on 03/10/2020 for Enterococcus.     - Bld Cx cultures on 03/17/2020 show ESBL E. coli     - 8/5: remains afebrile. WBC trending back down. Still awaiting TEE; having difficulty getting consent. Continue abx.  Bleeding subclavian line Thrombocytopenia     - s/p 1 unit of platelets and 2 units of fresh frozen plasma     - Eliquis was held     - plts are trending up, no new bleed noted     - cardiology to dictate when ok to resume eliquis  Persistent atrial fibrillation with RVR:     - Probably driven by her  infectious bacteremia.     - Due to heart control heart rate EP was consulted recommended to continue amiodarone, metoprolol and diltiazem     - caridology onboard, defer to them, appreciate assistance  Acute on chronic respiratory failure with hypoxia acute decompensated diastolic heart failure:     - on baseline O2 use     - volume control w/ HD  N/V     - c/o N/V this AM.     - no stools documented     - PRN anti-emetics, check KUB  Acute metabolic/toxic encephalopathy:     - neurology eval'd; rec'd continuing primidone and Keppra     - last ammonia was 50     - encephalopathy is resolved.  ESRD on HD     - per nephrology  Transaminitis     - Likely due to shock liver     - RUQ US showed no obstructive jaundice.     - Transaminases are improving.  Insulin-dependent diabetes mellitus type 2     - A1c: 8.0     - on levemir 28 units BID; has had some episodes of hypoglycemia today. Decrease to 25 units.     - continue SSI, DM diet  Elevated troponins     - Likely due to demand ischemia, further management per cardiology.  Hypothyroidism:     - Continue Synthroid.  Rheumatoid arthritis:     - On chronic prednisone continue current regimen.  Gout     - denies complaints.      - follow  Schizophrenia     - on welbutrin, abilify  Hyperkalemia     - resolved on HD  Stage II ulcer present on admission:     - RN Pressure Injury Documentation:     - Pressure Injury 03/10/20 Heel Left Unstageable - Full thickness tissue loss in which the base of the injury is covered by slough (yellow, tan, gray, green or brown) and/or eschar (tan, brown or black) in the wound bed. 3 cm x 2 cm (Active) 03/10/20 1730 Location: Heel Location Orientation: Left Staging: Unstageable - Full thickness tissue loss in which the base of the injury is covered by slough (yellow, tan, gray, green or brown) and/or eschar (tan, brown or black) in the wound bed. Wound Description  (Comments): 3 cm x 2 cm Present on Admission: Yes  DVT prophylaxis: SCDs Code Status: FULL Family Communication: None at bedside   Status is: Inpatient  Remains inpatient appropriate because:Inpatient level of care appropriate due to severity of illness   Dispo: The patient is from: Home              Anticipated d/c is to: SNF              Anticipated d/c date is: > 3 days              Patient currently is not medically stable to d/c.  Consultants:   Cardiology  Nephrology  Antimicrobials:  . Primaxin   ROS:  Reports N, V. Denies CP, ab pain, dyspnea . Remainder ROS is negative for all not previously mentioned.  Subjective: "I felt sick to my stomach."  Objective: Vitals:   03/19/20 1955 03/19/20 2150 03/20/20 0408 03/20/20 1135  BP: 108/61 118/67 132/86   Pulse:      Resp: 16  14   Temp: 98.2 F (36.8 C)  98.1 F (36.7 C) 98.2 F (36.8 C)  TempSrc: Oral   Oral  SpO2: 100%  99%   Weight:   86.2 kg   Height:        Intake/Output Summary (Last 24 hours) at 03/20/2020 1524 Last data filed at 03/19/2020 1700 Gross per 24 hour  Intake 120 ml  Output --  Net 120 ml   Filed Weights   03/19/20 0830 03/19/20 1233 03/20/20 0408  Weight: 87.8 kg 85.5 kg 86.2 kg    Examination:  General: 69 y.o. female resting in bed in NAD Cardiovascular: RRR, +S1, S2, no m/g/r, equal pulses throughout Respiratory: CTABL, no w/r/r, normal WOB GI: BS+, NDNT, no masses noted, no organomegaly noted MSK: No e/c/c Neuro: A&O x 3, no focal deficits Psyc: Appropriate interaction and affect, calm/cooperative  Data Reviewed: I have personally reviewed following labs and imaging studies.  CBC: Recent Labs  Lab 03/16/20 0535 03/17/20 0328 03/18/20 0727 03/19/20 0544 03/20/20 0837  WBC 10.0 11.0* 12.9* 15.3* 13.7*  HGB 8.4* 8.9* 9.6* 9.3* 9.8*  HCT 26.8* 27.6* 30.6* 29.9* 30.2*  MCV 112.6* 111.7* 112.9* 112.4* 110.2*  PLT 74* 89* 95* 123* 973*   Basic Metabolic Panel: Recent  Labs  Lab 03/16/20 0535 03/17/20 0328 03/18/20 0727 03/19/20 0544 03/20/20 0837  NA 136 138 138 136 135  K 4.0 3.6 4.1 4.3 4.0  CL 98 99 98 95* 95*  CO2 25 26 29 29 29   GLUCOSE 394* 151*  69* 65* 139*  BUN 62* 73* 35* 46* 28*  CREATININE 4.06* 4.78* 3.28* 4.06* 2.92*  CALCIUM 9.0 9.4 9.2 9.6 9.2  PHOS 3.2 2.6 2.6 2.3* 2.6   GFR: Estimated Creatinine Clearance: 18.8 mL/min (A) (by C-G formula based on SCr of 2.92 mg/dL (H)). Liver Function Tests: Recent Labs  Lab 03/14/20 1812 03/14/20 1812 03/15/20 0500 03/15/20 0500 03/16/20 0535 03/17/20 0328 03/18/20 0727 03/19/20 0544 03/20/20 0837  AST 366*  --  181*  --   --   --  38  --   --   ALT 1,393*  --  1,118*  --   --   --  487*  --   --   ALKPHOS 145*  --  129*  --   --   --  117  --   --   BILITOT 1.6*  --  1.2  --   --   --  0.8  --   --   PROT 4.9*  --  4.8*  --   --   --  5.0*  --   --   ALBUMIN 2.8*   < > 2.6*   < > 2.8* 2.7* 2.8*  2.7* 2.7* 2.6*   < > = values in this interval not displayed.   No results for input(s): LIPASE, AMYLASE in the last 168 hours. No results for input(s): AMMONIA in the last 168 hours. Coagulation Profile: Recent Labs  Lab 03/14/20 0415  INR 1.7*   Cardiac Enzymes: No results for input(s): CKTOTAL, CKMB, CKMBINDEX, TROPONINI in the last 168 hours. BNP (last 3 results) No results for input(s): PROBNP in the last 8760 hours. HbA1C: No results for input(s): HGBA1C in the last 72 hours. CBG: Recent Labs  Lab 03/19/20 1702 03/19/20 2047 03/20/20 0607 03/20/20 0641 03/20/20 1134  GLUCAP 191* 260* 44* 186* 139*   Lipid Profile: No results for input(s): CHOL, HDL, LDLCALC, TRIG, CHOLHDL, LDLDIRECT in the last 72 hours. Thyroid Function Tests: No results for input(s): TSH, T4TOTAL, FREET4, T3FREE, THYROIDAB in the last 72 hours. Anemia Panel: No results for input(s): VITAMINB12, FOLATE, FERRITIN, TIBC, IRON, RETICCTPCT in the last 72 hours. Sepsis Labs: Recent Labs  Lab  03/14/20 1812  LATICACIDVEN 2.2*    Recent Results (from the past 240 hour(s))  MRSA PCR Screening     Status: Abnormal   Collection Time: 03/10/20  6:08 PM   Specimen: Nasopharyngeal  Result Value Ref Range Status   MRSA by PCR (A) NEGATIVE Final    INVALID, UNABLE TO DETERMINE THE PRESENCE OF TARGET DUE TO SPECIMEN INTEGRITY. RECOLLECTION REQUESTED.    Comment: A,KOTEY @2145  03/10/20 EB Performed at [facility] [address], [place], [place] [address]   MRSA PCR Screening     Status: None   Collection Time: 03/11/20  8:17 AM   Specimen: Nasal Mucosa; Nasopharyngeal  Result Value Ref Range Status   MRSA by PCR NEGATIVE NEGATIVE Final    Comment:        The GeneXpert MRSA Assay (FDA approved for NASAL specimens only), is one component of a comprehensive MRSA colonization surveillance program. It is not intended to diagnose MRSA infection nor to guide or monitor treatment for MRSA infections. Performed at [facility], [place] [address], [place], [place] [address]   Urine culture     Status: Abnormal   Collection Time: 03/11/20  9:17 AM   Specimen: In/Out Cath Urine  Result Value Ref Range Status   Specimen  Description IN/OUT CATH URINE  Final   Special Requests   Final    NONE Performed at [facility], [place] [address] [place], [place] [address]    Culture MULTIPLE SPECIES PRESENT, SUGGEST RECOLLECTION (A)  Final   Report Status 03/12/2020 FINAL  Final  Culture, blood (routine x 2)     Status: None   Collection Time: 03/12/20 12:13 PM   Specimen: BLOOD LEFT HAND  Result Value Ref Range Status   Specimen Description BLOOD LEFT HAND  Final   Special Requests   Final    BOTTLES DRAWN AEROBIC ONLY Blood Culture results may not be optimal due to an inadequate volume of blood received in culture bottles   Culture   Final    NO GROWTH 5 DAYS Performed at [facility], [place] [address], [place], [place] [address]    Report Status 03/17/2020 FINAL   Final  Culture, blood (routine x 2)     Status: None   Collection Time: 03/12/20  7:17 PM   Specimen: BLOOD LEFT HAND  Result Value Ref Range Status   Specimen Description BLOOD LEFT HAND  Final   Special Requests   Final    BOTTLES DRAWN AEROBIC ONLY Blood Culture results may not be optimal due to an inadequate volume of blood received in culture bottles   Culture   Final    NO GROWTH 5 DAYS Performed at [facility], [place] [address], [place], [place] [address]    Report Status 03/17/2020 FINAL  Final  Culture, blood (Routine X 2) w Reflex to ID Panel     Status: None   Collection Time: 03/14/20  5:59 PM   Specimen: BLOOD LEFT HAND  Result Value Ref Range Status   Specimen Description BLOOD LEFT HAND  Final   Special Requests   Final    BOTTLES DRAWN AEROBIC AND ANAEROBIC Blood Culture adequate volume   Culture   Final    NO GROWTH 5 DAYS Performed at [facility], [place] [address], [place], [place] [address]    Report Status 03/19/2020 FINAL  Final  Culture, blood (Routine X 2) w Reflex to ID Panel     Status: None   Collection Time: 03/14/20 10:09 PM   Specimen: BLOOD  Result Value Ref Range Status   Specimen Description BLOOD LEFT HAND  Final   Special Requests   Final    BOTTLES DRAWN AEROBIC ONLY Blood Culture adequate volume   Culture   Final    NO GROWTH 5 DAYS Performed at [facility], [place] [address], [place], [place] [address]    Report Status 03/19/2020 FINAL  Final  Culture, blood (Routine X 2) w Reflex to ID Panel     Status: Abnormal   Collection Time: 03/17/20  3:54 PM   Specimen: BLOOD LEFT ARM  Result Value Ref Range Status   Specimen Description BLOOD LEFT ARM  Final   Special Requests   Final    BOTTLES DRAWN AEROBIC ONLY Blood Culture adequate volume   Culture  Setup Time   Final    GRAM NEGATIVE RODS AEROBIC BOTTLE ONLY CRITICAL RESULT CALLED TO, READ BACK BY AND VERIFIED WITH: A. Rogers Blocker PharmD 8:45 03/18/20 ([email or web address]) Performed at  [facility], [place] [address], [place], [place] [address]    Culture (A)  Final    ESCHERICHIA COLI Confirmed Extended Spectrum Beta-Lactamase Producer (ESBL).  In bloodstream infections from ESBL organisms, carbapenems  are preferred over piperacillin/tazobactam. They are shown to have a lower risk of mortality.    Report Status 03/20/2020 FINAL  Final   Organism ID, Bacteria ESCHERICHIA COLI  Final      Susceptibility   Escherichia coli - MIC*    AMPICILLIN >=32 RESISTANT Resistant     CEFAZOLIN >=64 RESISTANT Resistant     CEFEPIME >=32 RESISTANT Resistant     CEFTAZIDIME >=64 RESISTANT Resistant     CEFTRIAXONE >=64 RESISTANT Resistant     CIPROFLOXACIN >=4 RESISTANT Resistant     GENTAMICIN <=1 SENSITIVE Sensitive     IMIPENEM <=0.25 SENSITIVE Sensitive     TRIMETH/SULFA >=320 RESISTANT Resistant     AMPICILLIN/SULBACTAM >=32 RESISTANT Resistant     PIP/TAZO 64 INTERMEDIATE Intermediate     * ESCHERICHIA COLI  Culture, blood (Routine X 2) w Reflex to ID Panel     Status: Abnormal   Collection Time: 03/17/20  3:54 PM   Specimen: BLOOD LEFT ARM  Result Value Ref Range Status   Specimen Description BLOOD LEFT ARM  Final   Special Requests   Final    BOTTLES DRAWN AEROBIC ONLY Blood Culture adequate volume   Culture  Setup Time   Final    GRAM NEGATIVE RODS AEROBIC BOTTLE ONLY CRITICAL VALUE NOTED.  VALUE IS CONSISTENT WITH PREVIOUSLY REPORTED AND CALLED VALUE.    Culture (A)  Final    ESCHERICHIA COLI SUSCEPTIBILITIES PERFORMED ON PREVIOUS CULTURE WITHIN THE LAST 5 DAYS. Performed at [facility], [place] [address] [place], [place] [address]    Report Status 03/20/2020 FINAL  Final  Blood Culture ID Panel (Reflexed)     Status: Abnormal   Collection Time: 03/17/20  3:54 PM  Result Value Ref Range Status   Enterococcus faecalis NOT DETECTED NOT DETECTED Final   Enterococcus Faecium NOT DETECTED NOT DETECTED Final   Listeria monocytogenes NOT DETECTED NOT DETECTED  Final   Staphylococcus species NOT DETECTED NOT DETECTED Final   Staphylococcus aureus (BCID) NOT DETECTED NOT DETECTED Final   Staphylococcus epidermidis NOT DETECTED NOT DETECTED Final   Staphylococcus lugdunensis NOT DETECTED NOT DETECTED Final   Streptococcus species NOT DETECTED NOT DETECTED Final   Streptococcus agalactiae NOT DETECTED NOT DETECTED Final   Streptococcus pneumoniae NOT DETECTED NOT DETECTED Final   Streptococcus pyogenes NOT DETECTED NOT DETECTED Final   A.calcoaceticus-baumannii NOT DETECTED NOT DETECTED Final   Bacteroides fragilis NOT DETECTED NOT DETECTED Final   Enterobacterales DETECTED (A) NOT DETECTED Final    Comment: Enterobacterales represent a large order of gram negative bacteria, not a single organism. CRITICAL RESULT CALLED TO, READ BACK BY AND VERIFIED WITH: A. Rogers Blocker PharmD 8:45 03/18/20 ([email or web address])    Enterobacter cloacae complex NOT DETECTED NOT DETECTED Final   Escherichia coli DETECTED (A) NOT DETECTED Final    Comment: CRITICAL RESULT CALLED TO, READ BACK BY AND VERIFIED WITH: A. Rogers Blocker PharmD 8:45 03/18/20 ([email or web address])    Klebsiella aerogenes NOT DETECTED NOT DETECTED Final   Klebsiella oxytoca NOT DETECTED NOT DETECTED Final   Klebsiella pneumoniae NOT DETECTED NOT DETECTED Final   Proteus species NOT DETECTED NOT DETECTED Final   Salmonella species NOT DETECTED NOT DETECTED Final   Serratia marcescens NOT DETECTED NOT DETECTED Final   Haemophilus influenzae NOT DETECTED NOT DETECTED Final   Neisseria meningitidis NOT DETECTED NOT DETECTED Final   Pseudomonas aeruginosa NOT DETECTED NOT DETECTED Final   Stenotrophomonas maltophilia NOT DETECTED NOT DETECTED Final   Candida albicans NOT DETECTED NOT  DETECTED Final   Candida auris NOT DETECTED NOT DETECTED Final   Candida glabrata NOT DETECTED NOT DETECTED Final   Candida krusei NOT DETECTED NOT DETECTED Final   Candida parapsilosis NOT DETECTED NOT DETECTED Final   Candida tropicalis NOT DETECTED  NOT DETECTED Final   Cryptococcus neoformans/gattii NOT DETECTED NOT DETECTED Final   CTX-M ESBL DETECTED (A) NOT DETECTED Final    Comment: CRITICAL RESULT CALLED TO, READ BACK BY AND VERIFIED WITH: A. Rogers Blocker PharmD 8:45 03/18/20 ([email or web address]) (NOTE) Extended spectrum beta-lactamase detected. Recommend a carbapenem as initial therapy.      Carbapenem resistance IMP NOT DETECTED NOT DETECTED Final   Carbapenem resistance KPC NOT DETECTED NOT DETECTED Final   Carbapenem resistance NDM NOT DETECTED NOT DETECTED Final   Carbapenem resist OXA 48 LIKE NOT DETECTED NOT DETECTED Final   Carbapenem resistance VIM NOT DETECTED NOT DETECTED Final    Comment: Performed at [facility], [place] [address] [place], [place] [address]  Culture, blood (routine x 2)     Status: None (Preliminary result)   Collection Time: 03/19/20  4:02 PM   Specimen: BLOOD LEFT ARM  Result Value Ref Range Status   Specimen Description BLOOD LEFT ARM  Final   Special Requests   Final    BOTTLES DRAWN AEROBIC ONLY Blood Culture adequate volume   Culture   Final    NO GROWTH < 24 HOURS Performed at [facility], [place] [address] [place], [place] [address]    Report Status PENDING  Incomplete  Culture, blood (routine x 2)     Status: None (Preliminary result)   Collection Time: 03/19/20  4:02 PM   Specimen: BLOOD LEFT ARM  Result Value Ref Range Status   Specimen Description BLOOD LEFT ARM  Final   Special Requests   Final    BOTTLES DRAWN AEROBIC ONLY Blood Culture adequate volume   Culture   Final    NO GROWTH < 24 HOURS Performed at [facility], [place] [address], [place],  [address]    Report Status PENDING  Incomplete      Radiology Studies: CT ABDOMEN PELVIS W CONTRAST  Result Date: 03/18/2020 CLINICAL DATA:  69 year old female with sepsis. EXAM: CT ABDOMEN AND PELVIS WITH CONTRAST TECHNIQUE: Multidetector CT imaging of the abdomen and pelvis was performed using the standard protocol  following bolus administration of intravenous contrast. CONTRAST:  153mL OMNIPAQUE IOHEXOL 300 MG/ML  SOLN COMPARISON:  None. FINDINGS: Evaluation is limited due to streak artifact caused by patient's arms. Lower chest: There is a moderate size right pleural effusion with higher attenuating content suspicious for a serosanguineous or hemorrhagic fluid. Clinical correlation is recommended. There is consolidative changes of the visualized right lower lobe which may represent compressive atelectasis although infiltrate is not excluded. There is a small left pleural effusion with partial left lung base atelectasis. There is mild cardiomegaly. Advanced coronary vascular calcification. No intra-abdominal free air. Small perihepatic ascites. Hepatobiliary: The liver is unremarkable. No intrahepatic biliary ductal dilatation. Cholecystectomy. Pancreas: Unremarkable. No pancreatic ductal dilatation or surrounding inflammatory changes. Spleen: Normal in size without focal abnormality. Adrenals/Urinary Tract: The adrenal glands unremarkable. There is moderate bilateral renal parenchyma atrophy. There is no hydronephrosis on either side. The visualized ureters and urinary bladder appear unremarkable. Stomach/Bowel: There is sigmoid diverticulosis without active inflammatory changes. There is moderate stool throughout the colon. There is no bowel obstruction. Appendectomy. Vascular/Lymphatic: Mild aortoiliac atherosclerotic disease. The IVC is unremarkable. No portal venous  gas. There is no adenopathy. Reproductive: The uterus is grossly unremarkable. Other: There is diffuse subcutaneous edema and anasarca. No fluid collection. Small fat containing umbilical hernia. Musculoskeletal: Degenerative changes of the spine. No acute osseous pathology. IMPRESSION: 1. Moderate size right pleural effusion with higher attenuating content suspicious for a serosanguineous or hemorrhagic fluid. Clinical correlation is recommended. There is  compressive atelectasis of the right lower lobe. 2. Small left pleural effusion with partial left lung base atelectasis. 3. Sigmoid diverticulosis. No bowel obstruction. 4. Diffuse subcutaneous edema and anasarca. 5. Aortic Atherosclerosis (ICD10-I70.0). These results will be called to the ordering clinician or representative by the Radiologist Assistant, and communication documented in the PACS or [employer]. Electronically Signed   By: Anner Crete M.D.   On: 03/18/2020 19:32     Scheduled Meds: . amiodarone  200 mg Oral BID  . ARIPiprazole  2 mg Oral Daily  . aspirin EC  81 mg Oral Q breakfast  . buPROPion  150 mg Oral Daily  . calcitRIOL  0.25 mcg Oral Q M,W,F  . calcium acetate  2,001 mg Oral TID WC  . Chlorhexidine Gluconate Cloth  6 each Topical Daily  . diltiazem  360 mg Oral Daily  . feeding supplement (NEPRO CARB STEADY)  237 mL Oral BID BM  . insulin aspart  0-5 Units Subcutaneous QHS  . insulin aspart  0-6 Units Subcutaneous TID WC  . insulin aspart  4 Units Subcutaneous TID WC  . insulin detemir  28 Units Subcutaneous BID  . levETIRAcetam  250 mg Oral BID  . levothyroxine  50 mcg Oral QAC breakfast  . mouth rinse  15 mL Mouth Rinse BID  . metoprolol tartrate  100 mg Oral BID  . midodrine  5 mg Oral TID WC  . montelukast  10 mg Oral QHS  . multivitamin  1 tablet Oral QHS  . pantoprazole  40 mg Oral Daily  . predniSONE  10 mg Oral Q breakfast  . primidone  50 mg Oral Daily  . sodium chloride flush  10-40 mL Intracatheter Q12H  . Thrombi-Pad  1 each Topical Once  . traZODone  50 mg Oral QHS   Continuous Infusions: . sodium chloride 250 mL (03/17/20 1309)  . imipenem-cilastatin 500 mg (03/20/20 0951)     LOS: 10 days    Time spent: 25 minutes spent in the coordination of care today.    Jonnie Finner, DO [facility]  If 7PM-7AM, please contact night-coverage [email or web address] 03/20/2020, 3:24 PM

## 2020-03-21 DIAGNOSIS — Z7189 Other specified counseling: Secondary | ICD-10-CM | POA: Diagnosis not present

## 2020-03-21 DIAGNOSIS — R0602 Shortness of breath: Secondary | ICD-10-CM | POA: Diagnosis not present

## 2020-03-21 DIAGNOSIS — J90 Pleural effusion, not elsewhere classified: Secondary | ICD-10-CM | POA: Diagnosis not present

## 2020-03-21 DIAGNOSIS — A4181 Sepsis due to Enterococcus: Secondary | ICD-10-CM | POA: Diagnosis not present

## 2020-03-21 DIAGNOSIS — Z66 Do not resuscitate: Secondary | ICD-10-CM

## 2020-03-21 DIAGNOSIS — N186 End stage renal disease: Secondary | ICD-10-CM | POA: Diagnosis not present

## 2020-03-21 DIAGNOSIS — I10 Essential (primary) hypertension: Secondary | ICD-10-CM | POA: Diagnosis not present

## 2020-03-21 DIAGNOSIS — Z515 Encounter for palliative care: Secondary | ICD-10-CM | POA: Diagnosis not present

## 2020-03-21 DIAGNOSIS — I4891 Unspecified atrial fibrillation: Secondary | ICD-10-CM | POA: Diagnosis not present

## 2020-03-21 DIAGNOSIS — R6521 Severe sepsis with septic shock: Secondary | ICD-10-CM | POA: Diagnosis not present

## 2020-03-21 DIAGNOSIS — I5033 Acute on chronic diastolic (congestive) heart failure: Secondary | ICD-10-CM | POA: Diagnosis not present

## 2020-03-21 DIAGNOSIS — R7881 Bacteremia: Secondary | ICD-10-CM | POA: Diagnosis not present

## 2020-03-21 DIAGNOSIS — J9621 Acute and chronic respiratory failure with hypoxia: Secondary | ICD-10-CM | POA: Diagnosis not present

## 2020-03-21 LAB — DIFFERENTIAL
Abs Immature Granulocytes: 0 10*3/uL (ref 0.00–0.07)
Basophils Absolute: 0 10*3/uL (ref 0.0–0.1)
Basophils Relative: 0 %
Eosinophils Absolute: 0.1 10*3/uL (ref 0.0–0.5)
Eosinophils Relative: 1 %
Lymphocytes Relative: 4 %
Lymphs Abs: 0.5 10*3/uL — ABNORMAL LOW (ref 0.7–4.0)
Monocytes Absolute: 0.4 10*3/uL (ref 0.1–1.0)
Monocytes Relative: 3 %
Neutro Abs: 11.5 10*3/uL — ABNORMAL HIGH (ref 1.7–7.7)
Neutrophils Relative %: 92 %
nRBC: 0 /100 WBC

## 2020-03-21 LAB — GLUCOSE, CAPILLARY
Glucose-Capillary: 51 mg/dL — ABNORMAL LOW (ref 70–99)
Glucose-Capillary: 45 mg/dL — ABNORMAL LOW (ref 70–99)
Glucose-Capillary: 49 mg/dL — ABNORMAL LOW (ref 70–99)
Glucose-Capillary: 70 mg/dL (ref 70–99)
Glucose-Capillary: 190 mg/dL — ABNORMAL HIGH (ref 70–99)
Glucose-Capillary: 243 mg/dL — ABNORMAL HIGH (ref 70–99)
Glucose-Capillary: 225 mg/dL — ABNORMAL HIGH (ref 70–99)

## 2020-03-21 LAB — RENAL FUNCTION PANEL
Albumin: 2.7 g/dL — ABNORMAL LOW (ref 3.5–5.0)
Anion gap: 13 (ref 5–15)
BUN: 44 mg/dL — ABNORMAL HIGH (ref 8–23)
CO2: 25 mmol/L (ref 22–32)
Calcium: 10 mg/dL (ref 8.9–10.3)
Chloride: 94 mmol/L — ABNORMAL LOW (ref 98–111)
Creatinine, Ser: 4.07 mg/dL — ABNORMAL HIGH (ref 0.44–1.00)
GFR calc Af Amer: 12 mL/min — ABNORMAL LOW (ref >60)
GFR calc non Af Amer: 11 mL/min — ABNORMAL LOW (ref >60)
Glucose, Bld: 254 mg/dL — ABNORMAL HIGH (ref 70–99)
Phosphorus: 3.8 mg/dL (ref 2.5–4.6)
Potassium: 5.1 mmol/L (ref 3.5–5.1)
Sodium: 132 mmol/L — ABNORMAL LOW (ref 135–145)

## 2020-03-21 LAB — CBC
HCT: 31.5 % — ABNORMAL LOW (ref 36.0–46.0)
Hemoglobin: 10.1 g/dL — ABNORMAL LOW (ref 12.0–15.0)
MCH: 35.6 pg — ABNORMAL HIGH (ref 26.0–34.0)
MCHC: 32.1 g/dL (ref 30.0–36.0)
MCV: 110.9 fL — ABNORMAL HIGH (ref 80.0–100.0)
Platelets: 136 10*3/uL — ABNORMAL LOW (ref 150–400)
RBC: 2.84 MIL/uL — ABNORMAL LOW (ref 3.87–5.11)
RDW: 15.3 % (ref 11.5–15.5)
WBC: 12.5 10*3/uL — ABNORMAL HIGH (ref 4.0–10.5)
nRBC: 0 % (ref 0.0–0.2)

## 2020-03-21 LAB — MAGNESIUM: Magnesium: 2.3 mg/dL (ref 1.7–2.4)

## 2020-03-21 MED ORDER — DEXTROSE 50 % IV SOLN
INTRAVENOUS | Status: AC
  Administered 2020-03-21: 50 mL
  Filled 2020-03-21: qty 50

## 2020-03-21 MED ORDER — INSULIN DETEMIR 100 UNIT/ML ~~LOC~~ SOLN
18.0000 [IU] | Freq: Two times a day (BID) | SUBCUTANEOUS | Status: DC
  Administered 2020-03-21 – 2020-03-24 (×7): 18 [IU] via SUBCUTANEOUS
  Filled 2020-03-21 (×8): qty 0.18

## 2020-03-21 MED ORDER — INSULIN DETEMIR 100 UNIT/ML ~~LOC~~ SOLN: 14.0000 [IU] | Freq: Two times a day (BID) | SUBCUTANEOUS | Status: DC

## 2020-03-21 NOTE — TOC Progression Note (Signed)
Transition of Care [facility]) - Progression Note    Patient Details  Name: BLINDA TUREK MRN: [id number] Date of Birth: 08-25-1950  Transition of Care [facility]) CM/SW [place], [place] Phone Number: 03/21/2020, 4:38 PM  Clinical Narrative:    Palliative has been trying to make contact with family to have a decision maker if patient is unable to do so herself. CSW has made multiple attempts to make contact with patient's niece Helene Kelp with no success. Discharge plan is still  to return to [facility].  Expected Discharge Plan: Skilled Nursing Facility Barriers to Discharge: Continued Medical Work up  Expected Discharge Plan and Services Expected Discharge Plan: [place]       Living arrangements for the past 2 months: [place]                                       Social Determinants of Health (SDOH) Interventions    Readmission Risk Interventions Readmission Risk Prevention Plan 03/11/2020 09/10/2019 11/29/2018  Transportation Screening Complete Complete Complete  Medication Review [employer]) - Complete Complete  PCP or Specialist appointment within 3-5 days of discharge - - Not Complete  PCP/Specialist Appt Not Complete comments - - earliest appt available was 12/08/18  [place] or Home Care Consult - - Complete  SW Recovery Care/Counseling Consult Complete - Complete  Palliative Care Screening - - Not Applicable  Skilled Nursing Facility Complete - Not Applicable  Some recent data might be hidden

## 2020-03-21 NOTE — Progress Notes (Signed)
PROGRESS NOTE    [name]  UXN:[id number] DOB: Sep 05, 1950 DOA: 03/10/2020 PCP: Glenda Chroman, MD   Brief Narrative:   Kathleen Boop Archeris an 69 y.o.femalepast medical history of chronic respiratory failure with hypoxia secondary to underlying COPD on 3 L of oxygen, A. fib on Eliquis end-stage renal disease on hemodialysis chronic diastolic heart failure moderate AS, who came into the hospital for hyperglycemia and tachycardia was found in A. fib with RVR, decompensated systolic heart failure and septic shock who presented to the ED with atrial fibrillation and RVR with a heart rate of 170 was started on a diltiazem drip on arrival to the ED heart rate improved however she developed progressive hypotension and was transitioned to amiodarone due to hypotension PCCM was consulted and started on pressors for septic shock due to right lower lobe pneumonia.  8/6: N/V improved throughout yesterday and she was able to eat. Having hypoglycemic episodes ON however. Decrease insulin. Continue primaxin for 2 weeks per ID. Still looking for good contact for consent, but it has been difficult. Appreciate all consultants assistance.   Assessment & Plan:  Septic shock due to Enterococcus faecalis bacteremia probably due to her HD catheter E coli bacteremia - ID was consulted he was sensitive to Vanco, then switch to Zyvox, then ampicillin now on imipenem due to blood cultures growing ESBL E. coli. - TEE pending - Central line discontinued on 03/16/2020. - Tunnel catheter successfully removed on 03/13/2020. - Bld Cx on 03/10/2020 for Enterococcus. - Bld Cx cultures on 03/17/2020 show ESBL E. coli - 8/6: Still awaiting TEE; having difficulty getting consent.; continue abx for 2 weeks per ID. Stat labs ordered.  Bleeding subclavian line Thrombocytopenia - s/p 1 unit of platelets and 2 units of fresh frozen plasma - Eliquis was held - plts are trending up, no new  bleed noted - awaiting labs. Hgb has been stable. Consider resuming eliquis.   Persistent atrial fibrillation with RVR: - Probably driven by her infectious bacteremia. - Due to heart control heart rate EP was consulted recommended to continue amiodarone, metoprolol and diltiazem - caridology onboard, defer to them, appreciate assistance  Acute on chronic respiratory failure with hypoxia acute decompensated diastolic heart failure Pleural effusion - on baseline O2 use - volume control w/ HD     - w/ baseline O2 use and small effusion, no further w/u at this time  N/V     - no stools documented     - PRN anti-emetics     - KUB negative     - N/V improved, tolerating diet   Acute metabolic/toxic encephalopathy: - neurology eval'd; rec'd continuing primidone and Keppra - last ammonia was 50 - encephalopathy is resolved.  ESRD on HD - per nephrology  Transaminitis - Likely due to shock liver - RUQ US showed no obstructive jaundice. - Transaminases are improving.  Insulin-dependent diabetes mellitus type 2 - A1c: 8.0     - Had hypoglycemic episodes ON, decrease levemir to 18 units BID - continue SSI, DM diet  Elevated troponins - Likely due to demand ischemia, further management per cardiology.  Hypothyroidism: - Continue Synthroid.  Rheumatoid arthritis: - On chronic prednisone continue current regimen.  Gout - denies complaints.  - follow  Schizophrenia - on welbutrin, abilify  Hyperkalemia - resolved on HD  Stage II ulcer present on admission: - RN Pressure Injury Documentation: - Pressure Injury 03/10/20 Heel Left Unstageable - Full thickness tissue loss in which the base of the injury  is covered by slough (yellow, tan, gray, green or brown) and/or eschar (tan, brown or black) in the wound bed. 3 cm x 2 cm (Active) 03/10/20 1730 Location:  Heel Location Orientation: Left Staging: Unstageable - Full thickness tissue loss in which the base of the injury is covered by slough (yellow, tan, gray, green or brown) and/or eschar (tan, brown or black) in the wound bed. Wound Description (Comments): 3 cm x 2 cm Present on Admission: Yes  DVT prophylaxis: SCDs Code Status: DNR Family Communication: None at bedside.   Status is: Inpatient  Remains inpatient appropriate because:Inpatient level of care appropriate due to severity of illness   Dispo: The patient is from: Home              Anticipated d/c is to: SNF              Anticipated d/c date is: > 3 days              Patient currently is not medically stable to d/c.  Consultants:   Cardiology  Nephrology  Antimicrobials:  . Primaxin   ROS:  Denies CP, dyspnea, N, ab pain. Remainder ROS is negative for all not previously mentioned.  Subjective: "I was able to eat."  Objective: Vitals:   03/20/20 2305 03/21/20 0438 03/21/20 1003 03/21/20 1136  BP:  127/79 138/66   Pulse: 99 100 90   Resp: 18 16    Temp:  97.7 F (36.5 C)  98.3 F (36.8 C)  TempSrc:    Oral  SpO2:  100%    Weight:  87.7 kg    Height:        Intake/Output Summary (Last 24 hours) at 03/21/2020 1530 Last data filed at 03/21/2020 1306 Gross per 24 hour  Intake 520 ml  Output --  Net 520 ml   Filed Weights   03/19/20 1233 03/20/20 0408 03/21/20 0438  Weight: 85.5 kg 86.2 kg 87.7 kg    Examination:  General: 69 y.o. female resting in bed in NAD Cardiovascular: RRR, +S1, S2, no m/g/r, equal pulses throughout Respiratory: CTABL, no w/r/r, normal WOB GI: BS+, NDNT, no masses noted, no organomegaly noted MSK: No e/c/c Neuro: Alert to name, follows commands Psyc: Appropriate interaction and affect, calm/cooperative   Data Reviewed: I have personally reviewed following labs and imaging studies.  CBC: Recent Labs  Lab 03/16/20 0535 03/17/20 0328 03/18/20 0727 03/19/20 0544  03/20/20 0837  WBC 10.0 11.0* 12.9* 15.3* 13.7*  HGB 8.4* 8.9* 9.6* 9.3* 9.8*  HCT 26.8* 27.6* 30.6* 29.9* 30.2*  MCV 112.6* 111.7* 112.9* 112.4* 110.2*  PLT 74* 89* 95* 123* 932*   Basic Metabolic Panel: Recent Labs  Lab 03/16/20 0535 03/17/20 0328 03/18/20 0727 03/19/20 0544 03/20/20 0837  NA 136 138 138 136 135  K 4.0 3.6 4.1 4.3 4.0  CL 98 99 98 95* 95*  CO2 25 26 29 29 29   GLUCOSE 394* 151* 69* 65* 139*  BUN 62* 73* 35* 46* 28*  CREATININE 4.06* 4.78* 3.28* 4.06* 2.92*  CALCIUM 9.0 9.4 9.2 9.6 9.2  PHOS 3.2 2.6 2.6 2.3* 2.6   GFR: Estimated Creatinine Clearance: 19 mL/min (A) (by C-G formula based on SCr of 2.92 mg/dL (H)). Liver Function Tests: Recent Labs  Lab 03/14/20 1812 03/14/20 1812 03/15/20 0500 03/15/20 0500 03/16/20 0535 03/17/20 0328 03/18/20 0727 03/19/20 0544 03/20/20 0837  AST 366*  --  181*  --   --   --  38  --   --  ALT 1,393*  --  1,118*  --   --   --  487*  --   --   ALKPHOS 145*  --  129*  --   --   --  117  --   --   BILITOT 1.6*  --  1.2  --   --   --  0.8  --   --   PROT 4.9*  --  4.8*  --   --   --  5.0*  --   --   ALBUMIN 2.8*   < > 2.6*   < > 2.8* 2.7* 2.8*  2.7* 2.7* 2.6*   < > = values in this interval not displayed.   No results for input(s): LIPASE, AMYLASE in the last 168 hours. No results for input(s): AMMONIA in the last 168 hours. Coagulation Profile: No results for input(s): INR, PROTIME in the last 168 hours. Cardiac Enzymes: No results for input(s): CKTOTAL, CKMB, CKMBINDEX, TROPONINI in the last 168 hours. BNP (last 3 results) No results for input(s): PROBNP in the last 8760 hours. HbA1C: No results for input(s): HGBA1C in the last 72 hours. CBG: Recent Labs  Lab 03/21/20 0447 03/21/20 0533 03/21/20 0559 03/21/20 0637 03/21/20 1123  GLUCAP 51* 49* 45* 70 190*   Lipid Profile: No results for input(s): CHOL, HDL, LDLCALC, TRIG, CHOLHDL, LDLDIRECT in the last 72 hours. Thyroid Function Tests: No results  for input(s): TSH, T4TOTAL, FREET4, T3FREE, THYROIDAB in the last 72 hours. Anemia Panel: No results for input(s): VITAMINB12, FOLATE, FERRITIN, TIBC, IRON, RETICCTPCT in the last 72 hours. Sepsis Labs: Recent Labs  Lab 03/14/20 1812  LATICACIDVEN 2.2*    Recent Results (from the past 240 hour(s))  Culture, blood (routine x 2)     Status: None   Collection Time: 03/12/20 12:13 PM   Specimen: BLOOD LEFT HAND  Result Value Ref Range Status   Specimen Description BLOOD LEFT HAND  Final   Special Requests   Final    BOTTLES DRAWN AEROBIC ONLY Blood Culture results may not be optimal due to an inadequate volume of blood received in culture bottles   Culture   Final    NO GROWTH 5 DAYS Performed at [facility], [place] [address] [place], [place] [address]    Report Status 03/17/2020 FINAL  Final  Culture, blood (routine x 2)     Status: None   Collection Time: 03/12/20  7:17 PM   Specimen: BLOOD LEFT HAND  Result Value Ref Range Status   Specimen Description BLOOD LEFT HAND  Final   Special Requests   Final    BOTTLES DRAWN AEROBIC ONLY Blood Culture results may not be optimal due to an inadequate volume of blood received in culture bottles   Culture   Final    NO GROWTH 5 DAYS Performed at [facility], [place] [address], [place], [place] [address]    Report Status 03/17/2020 FINAL  Final  Culture, blood (Routine X 2) w Reflex to ID Panel     Status: None   Collection Time: 03/14/20  5:59 PM   Specimen: BLOOD LEFT HAND  Result Value Ref Range Status   Specimen Description BLOOD LEFT HAND  Final   Special Requests   Final    BOTTLES DRAWN AEROBIC AND ANAEROBIC Blood Culture adequate volume   Culture   Final    NO GROWTH 5 DAYS Performed at [facility], [place] [address], [place], [place] [address]  Report Status 03/19/2020 FINAL  Final  Culture, blood (Routine X 2) w Reflex to ID Panel     Status: None   Collection Time: 03/14/20 10:09 PM   Specimen: BLOOD   Result Value Ref Range Status   Specimen Description BLOOD LEFT HAND  Final   Special Requests   Final    BOTTLES DRAWN AEROBIC ONLY Blood Culture adequate volume   Culture   Final    NO GROWTH 5 DAYS Performed at [facility], [address] [place], [place] [address]    Report Status 03/19/2020 FINAL  Final  Culture, blood (Routine X 2) w Reflex to ID Panel     Status: Abnormal   Collection Time: 03/17/20  3:54 PM   Specimen: BLOOD LEFT ARM  Result Value Ref Range Status   Specimen Description BLOOD LEFT ARM  Final   Special Requests   Final    BOTTLES DRAWN AEROBIC ONLY Blood Culture adequate volume   Culture  Setup Time   Final    GRAM NEGATIVE RODS AEROBIC BOTTLE ONLY CRITICAL RESULT CALLED TO, READ BACK BY AND VERIFIED WITH: A. Rogers Blocker PharmD 8:45 03/18/20 ([email or web address]) Performed at [facility], [place] [address], [place], [place] [address]    Culture (A)  Final    ESCHERICHIA COLI Confirmed Extended Spectrum Beta-Lactamase Producer (ESBL).  In bloodstream infections from ESBL organisms, carbapenems are preferred over piperacillin/tazobactam. They are shown to have a lower risk of mortality.    Report Status 03/20/2020 FINAL  Final   Organism ID, Bacteria ESCHERICHIA COLI  Final      Susceptibility   Escherichia coli - MIC*    AMPICILLIN >=32 RESISTANT Resistant     CEFAZOLIN >=64 RESISTANT Resistant     CEFEPIME >=32 RESISTANT Resistant     CEFTAZIDIME >=64 RESISTANT Resistant     CEFTRIAXONE >=64 RESISTANT Resistant     CIPROFLOXACIN >=4 RESISTANT Resistant     GENTAMICIN <=1 SENSITIVE Sensitive     IMIPENEM <=0.25 SENSITIVE Sensitive     TRIMETH/SULFA >=320 RESISTANT Resistant     AMPICILLIN/SULBACTAM >=32 RESISTANT Resistant     PIP/TAZO 64 INTERMEDIATE Intermediate     * ESCHERICHIA COLI  Culture, blood (Routine X 2) w Reflex to ID Panel     Status: Abnormal   Collection Time: 03/17/20  3:54 PM   Specimen: BLOOD LEFT ARM  Result Value Ref Range Status    Specimen Description BLOOD LEFT ARM  Final   Special Requests   Final    BOTTLES DRAWN AEROBIC ONLY Blood Culture adequate volume   Culture  Setup Time   Final    GRAM NEGATIVE RODS AEROBIC BOTTLE ONLY CRITICAL VALUE NOTED.  VALUE IS CONSISTENT WITH PREVIOUSLY REPORTED AND CALLED VALUE.    Culture (A)  Final    ESCHERICHIA COLI SUSCEPTIBILITIES PERFORMED ON PREVIOUS CULTURE WITHIN THE LAST 5 DAYS. Performed at [facility], [place] [address] [place],  [address]    Report Status 03/20/2020 FINAL  Final  Blood Culture ID Panel (Reflexed)     Status: Abnormal   Collection Time: 03/17/20  3:54 PM  Result Value Ref Range Status   Enterococcus faecalis NOT DETECTED NOT DETECTED Final   Enterococcus Faecium NOT DETECTED NOT DETECTED Final   Listeria monocytogenes NOT DETECTED NOT DETECTED Final   Staphylococcus species NOT DETECTED NOT DETECTED Final   Staphylococcus aureus (BCID) NOT DETECTED NOT DETECTED Final   Staphylococcus epidermidis NOT DETECTED NOT DETECTED Final  Staphylococcus lugdunensis NOT DETECTED NOT DETECTED Final   Streptococcus species NOT DETECTED NOT DETECTED Final   Streptococcus agalactiae NOT DETECTED NOT DETECTED Final   Streptococcus pneumoniae NOT DETECTED NOT DETECTED Final   Streptococcus pyogenes NOT DETECTED NOT DETECTED Final   A.calcoaceticus-baumannii NOT DETECTED NOT DETECTED Final   Bacteroides fragilis NOT DETECTED NOT DETECTED Final   Enterobacterales DETECTED (A) NOT DETECTED Final    Comment: Enterobacterales represent a large order of gram negative bacteria, not a single organism. CRITICAL RESULT CALLED TO, READ BACK BY AND VERIFIED WITH: A. Rogers Blocker PharmD 8:45 03/18/20 ([email or web address])    Enterobacter cloacae complex NOT DETECTED NOT DETECTED Final   Escherichia coli DETECTED (A) NOT DETECTED Final    Comment: CRITICAL RESULT CALLED TO, READ BACK BY AND VERIFIED WITH: A. Rogers Blocker PharmD 8:45 03/18/20 ([email or web address])    Klebsiella aerogenes NOT DETECTED  NOT DETECTED Final   Klebsiella oxytoca NOT DETECTED NOT DETECTED Final   Klebsiella pneumoniae NOT DETECTED NOT DETECTED Final   Proteus species NOT DETECTED NOT DETECTED Final   Salmonella species NOT DETECTED NOT DETECTED Final   Serratia marcescens NOT DETECTED NOT DETECTED Final   Haemophilus influenzae NOT DETECTED NOT DETECTED Final   Neisseria meningitidis NOT DETECTED NOT DETECTED Final   Pseudomonas aeruginosa NOT DETECTED NOT DETECTED Final   Stenotrophomonas maltophilia NOT DETECTED NOT DETECTED Final   Candida albicans NOT DETECTED NOT DETECTED Final   Candida auris NOT DETECTED NOT DETECTED Final   Candida glabrata NOT DETECTED NOT DETECTED Final   Candida krusei NOT DETECTED NOT DETECTED Final   Candida parapsilosis NOT DETECTED NOT DETECTED Final   Candida tropicalis NOT DETECTED NOT DETECTED Final   Cryptococcus neoformans/gattii NOT DETECTED NOT DETECTED Final   CTX-M ESBL DETECTED (A) NOT DETECTED Final    Comment: CRITICAL RESULT CALLED TO, READ BACK BY AND VERIFIED WITH: A. Rogers Blocker PharmD 8:45 03/18/20 ([email or web address]) (NOTE) Extended spectrum beta-lactamase detected. Recommend a carbapenem as initial therapy.      Carbapenem resistance IMP NOT DETECTED NOT DETECTED Final   Carbapenem resistance KPC NOT DETECTED NOT DETECTED Final   Carbapenem resistance NDM NOT DETECTED NOT DETECTED Final   Carbapenem resist OXA 48 LIKE NOT DETECTED NOT DETECTED Final   Carbapenem resistance VIM NOT DETECTED NOT DETECTED Final    Comment: Performed at [facility], [place] [address], [place], [place] [address]  Culture, blood (routine x 2)     Status: None (Preliminary result)   Collection Time: 03/19/20  4:02 PM   Specimen: BLOOD LEFT ARM  Result Value Ref Range Status   Specimen Description BLOOD LEFT ARM  Final   Special Requests   Final    BOTTLES DRAWN AEROBIC ONLY Blood Culture adequate volume   Culture   Final    NO GROWTH 2 DAYS Performed at [facility], [address], [place], [place] [address]    Report Status PENDING  Incomplete  Culture, blood (routine x 2)     Status: None (Preliminary result)   Collection Time: 03/19/20  4:02 PM   Specimen: BLOOD LEFT ARM  Result Value Ref Range Status   Specimen Description BLOOD LEFT ARM  Final   Special Requests   Final    BOTTLES DRAWN AEROBIC ONLY Blood Culture adequate volume   Culture   Final    NO GROWTH 2 DAYS Performed at [facility], [place] [address] [place],  [address]    Report Status PENDING  Incomplete  Radiology Studies: DG Abd 1 View  Result Date: 03/20/2020 CLINICAL DATA:  Nausea and vomiting EXAM: ABDOMEN - 1 VIEW COMPARISON:  03/18/2020 FINDINGS: Scattered large and small bowel gas is noted. Contrast material is noted throughout the colon from recent CT. No obstructive changes are noted. No free air is noted. IMPRESSION: No obstructive abnormality is noted. Electronically Signed   By: Inez Catalina M.D.   On: 03/20/2020 16:02     Scheduled Meds: . amiodarone  200 mg Oral BID  . ARIPiprazole  2 mg Oral Daily  . aspirin EC  81 mg Oral Q breakfast  . buPROPion  150 mg Oral Daily  . calcitRIOL  0.25 mcg Oral Q M,W,F  . calcium acetate  2,001 mg Oral TID WC  . Chlorhexidine Gluconate Cloth  6 each Topical Daily  . diltiazem  360 mg Oral Daily  . feeding supplement (ENSURE ENLIVE)  237 mL Oral BID BM  . insulin aspart  0-5 Units Subcutaneous QHS  . insulin aspart  0-6 Units Subcutaneous TID WC  . insulin aspart  4 Units Subcutaneous TID WC  . insulin detemir  18 Units Subcutaneous BID  . levETIRAcetam  250 mg Oral BID  . levothyroxine  50 mcg Oral QAC breakfast  . mouth rinse  15 mL Mouth Rinse BID  . metoprolol tartrate  100 mg Oral BID  . midodrine  5 mg Oral TID WC  . montelukast  10 mg Oral QHS  . multivitamin  1 tablet Oral QHS  . pantoprazole  40 mg Oral Daily  . predniSONE  10 mg Oral Q breakfast  . primidone  50 mg Oral Daily  . sodium chloride flush   10-40 mL Intracatheter Q12H  . traZODone  50 mg Oral QHS   Continuous Infusions: . sodium chloride 250 mL (03/17/20 1309)  . imipenem-cilastatin 500 mg (03/21/20 1059)     LOS: 11 days    Time spent: 25 minutes spent in the coordination of care today.    Jonnie Finner, DO [facility]  If 7PM-7AM, please contact night-coverage [email or web address] 03/21/2020, 3:30 PM

## 2020-03-21 NOTE — Progress Notes (Signed)
Palliative Medicine Inpatient Follow Up Note  Reason for consult:  Goals of Care "End of Life"  HPI:  Per intake [name] --> Kathleen G Archeris an 69 y.o.femalepast medical history of chronic respiratory failure with hypoxia secondary to underlying COPD on 3 L of oxygen, A. fib on Eliquis end-stage renal disease on hemodialysis chronic diastolic heart failure moderate AS, who came into the hospital for hyperglycemia and tachycardia was found in A. fib with RVR, decompensated systolic heart failure and septic shock who presented to the ED with atrial fibrillation and RVR with a heart rate of 170 was started on a diltiazem drip on arrival to the ED heart rate improved however she developed progressive hypotension and was transitioned to amiodarone due to hypotension PCCM was consulted and started on pressors for septic shock due to right lower lobe pneumonia.  Palliative care was asked to get involved to discuss goals of care and broach the topic of hospice.   Today's Discussion (03/21/2020): Chart reviewed. [name] is alert and oriented to person and place this morning. She is more clear minded and shares that her "heart was having trouble" leading to her hospital admission.  I asked [name] if she recalled meeting me on Sunday which she did not. I shared with her that she seemed fairly disoriented on Sunday.  We reviewed that [name] presently lives at [facility] skilled nursing facility. She shares with me that she has never held a steady job and moved to [place] from [place] in the setting of her brothers job.   In regard to advanced care [name] shares that she would rely on her niece [name] or nephew, [name] to make decisions on her behalf if she were unable to do so for herself. I asked her if she has either of their phone numbers which she stated she does not. Prior contact in the record was for [name] [phone] who was the medical director  at the prior facility the patient lived at.   I asked her if she would like to complete a MOST form while we are together. The patient   outlined their wishes for the following treatment decisions:  Cardiopulmonary Resuscitation: Do Not Attempt Resuscitation (DNR/No CPR)  Medical Interventions: Limited Additional Interventions: Use medical treatment, IV fluids and cardiac monitoring as indicated, DO NOT USE intubation or mechanical ventilation. May consider use of less invasive airway support such as BiPAP or CPAP. Also provide comfort measures. Transfer to the hospital if indicated. Avoid intensive care.   Antibiotics: Determine use of limitation of antibiotics when infection occurs  IV Fluids: IV fluids for a defined trial period  Feeding Tube: Feeding tube for a defined trial period   Discussed the importance of continued conversation with family and their  medical providers regarding overall plan of care and treatment options, ensuring decisions are within the context of the patients values and GOCs.  Questions and concerns addressed   SUMMARY OF RECOMMENDATIONS   DNAR/DNI  MOST Completed, paper copy placed onto the chart electric copy can be found in the Vynca section of epic  DNR Form Completed, paper copy placed onto the chart electric copy can be found in the Vynca section of epic  Ongoing conversations with [name] team to try to identify if there is a good point of contact for [place] given concern for repeat hospitalizations it would be of utility to have a family meeting with the involvement of her niece, [name]. The issue  at present is her lack of availability via telephone. I question if the number on file is accurate. Prior patient had been deemed capacitated to make her own decisions. I worry that she will eventually be admitted encephalopathic in which case we truly would need a good point of contact to aid in decision making.   Time Spent: 35 Greater than 50% of the  time was spent in counseling and coordination of care ______________________________________________________________________________________ [facility] Team Team Cell Phone: [phone] Please utilize secure chat with additional questions, if there is no response within 30 minutes please call the above phone number  Palliative Medicine Team providers are available by phone from 7am to 7pm daily and can be reached through the team cell phone.  Should this patient require assistance outside of these hours, please call the patient's attending physician.

## 2020-03-21 NOTE — Progress Notes (Signed)
Subjective: No new complaints   Antibiotics:  Anti-infectives (From admission, onward)   Start     Dose/Rate Route Frequency Ordered Stop   03/18/20 1000  imipenem-cilastatin (PRIMAXIN) 500 mg in sodium chloride 0.9 % 100 mL IVPB     Discontinue     500 mg 200 mL/hr over 30 Minutes Intravenous Every 12 hours 03/18/20 0856     03/13/20 1030  ampicillin (OMNIPEN) 2 g in sodium chloride 0.9 % 100 mL IVPB  Status:  Discontinued        2 g 300 mL/hr over 20 Minutes Intravenous Every 12 hours 03/13/20 1007 03/18/20 0856   03/13/20 0830  amoxicillin (AMOXIL) capsule 500 mg        500 mg Oral  Once 03/13/20 0748 03/13/20 0848   03/11/20 1200  ceFEPIme (MAXIPIME) 1 g in sodium chloride 0.9 % 100 mL IVPB  Status:  Discontinued        1 g 200 mL/hr over 30 Minutes Intravenous Every 24 hours 03/10/20 1141 03/10/20 1835   03/11/20 1200  vancomycin (VANCOREADY) IVPB 750 mg/150 mL  Status:  Discontinued        750 mg 150 mL/hr over 60 Minutes Intravenous Every 24 hours 03/10/20 1835 03/11/20 0921   03/11/20 1000  linezolid (ZYVOX) tablet 600 mg  Status:  Discontinued        600 mg Oral Every 12 hours 03/11/20 0921 03/13/20 1000   03/11/20 0000  vancomycin variable dose per unstable renal function (pharmacist dosing)  Status:  Discontinued         Does not apply See admin instructions 03/10/20 1141 03/10/20 1835   03/11/20 0000  ceFEPIme (MAXIPIME) 2 g in sodium chloride 0.9 % 100 mL IVPB  Status:  Discontinued        2 g 200 mL/hr over 30 Minutes Intravenous Every 12 hours 03/10/20 1835 03/11/20 0921   03/10/20 1145  ceFEPIme (MAXIPIME) 2 g in sodium chloride 0.9 % 100 mL IVPB        2 g 200 mL/hr over 30 Minutes Intravenous  Once 03/10/20 1133 03/10/20 1217   03/10/20 1145  vancomycin (VANCOREADY) IVPB 2000 mg/400 mL        2,000 mg 200 mL/hr over 120 Minutes Intravenous  Once 03/10/20 1137 03/10/20 1731   03/10/20 1115  aztreonam (AZACTAM) 2 g in sodium chloride 0.9 % 100 mL IVPB   Status:  Discontinued        2 g 200 mL/hr over 30 Minutes Intravenous  Once 03/10/20 1108 03/10/20 1133   03/10/20 1115  metroNIDAZOLE (FLAGYL) IVPB 500 mg        500 mg 100 mL/hr over 60 Minutes Intravenous  Once 03/10/20 1108 03/10/20 1356   03/10/20 1115  vancomycin (VANCOCIN) IVPB 1000 mg/200 mL premix  Status:  Discontinued        1,000 mg 200 mL/hr over 60 Minutes Intravenous  Once 03/10/20 1108 03/10/20 1137      Medications: Scheduled Meds: . amiodarone  200 mg Oral BID  . ARIPiprazole  2 mg Oral Daily  . aspirin EC  81 mg Oral Q breakfast  . buPROPion  150 mg Oral Daily  . calcitRIOL  0.25 mcg Oral Q M,W,F  . calcium acetate  2,001 mg Oral TID WC  . Chlorhexidine Gluconate Cloth  6 each Topical Daily  . diltiazem  360 mg Oral Daily  . feeding supplement (ENSURE ENLIVE)  237 mL Oral BID  BM  . insulin aspart  0-5 Units Subcutaneous QHS  . insulin aspart  0-6 Units Subcutaneous TID WC  . insulin aspart  4 Units Subcutaneous TID WC  . insulin detemir  18 Units Subcutaneous BID  . levETIRAcetam  250 mg Oral BID  . levothyroxine  50 mcg Oral QAC breakfast  . mouth rinse  15 mL Mouth Rinse BID  . metoprolol tartrate  100 mg Oral BID  . midodrine  5 mg Oral TID WC  . montelukast  10 mg Oral QHS  . multivitamin  1 tablet Oral QHS  . pantoprazole  40 mg Oral Daily  . predniSONE  10 mg Oral Q breakfast  . primidone  50 mg Oral Daily  . sodium chloride flush  10-40 mL Intracatheter Q12H  . traZODone  50 mg Oral QHS   Continuous Infusions: . sodium chloride 250 mL (03/17/20 1309)  . imipenem-cilastatin 500 mg (03/21/20 1059)   PRN Meds:.acetaminophen, albuterol, calcium carbonate, diphenhydrAMINE, docusate sodium, EPINEPHrine, heparin, HYDROcodone-acetaminophen, metoprolol tartrate, nitroGLYCERIN, ondansetron (ZOFRAN) IV, Resource ThickenUp Clear, sodium chloride flush    Objective: Weight change: -0.1 kg  Intake/Output Summary (Last 24 hours) at 03/21/2020 1232 Last  data filed at 03/21/2020 0949 Gross per 24 hour  Intake 220 ml  Output --  Net 220 ml   Blood pressure 138/66, pulse 90, temperature 98.3 F (36.8 C), temperature source Oral, resp. rate 16, height 5\' 2"  (1.575 m), weight 87.7 kg, SpO2 100 %. Temp:  [97.7 F (36.5 C)-98.3 F (36.8 C)] 98.3 F (36.8 C) (08/06 1136) Pulse Rate:  [90-101] 90 (08/06 1003) Resp:  [16-18] 16 (08/06 0438) BP: (127-141)/(66-96) 138/66 (08/06 1003) SpO2:  [100 %] 100 % (08/06 0438) Weight:  [87.7 kg] 87.7 kg (08/06 0438)  Physical Exam: Gen: alert and oriented to self and hospital HEENT: anicteric sclera, EOMI CVS irregularly irregular no mgr Chest: , no wheezing, no respiratory distress Abdomen: soft non-distended,  Skin: Ecchymoses Neuro: no new focal deficits  CBC:    BMET Recent Labs    03/19/20 0544 03/20/20 0837  NA 136 135  K 4.3 4.0  CL 95* 95*  CO2 29 29  GLUCOSE 65* 139*  BUN 46* 28*  CREATININE 4.06* 2.92*  CALCIUM 9.6 9.2     Liver Panel  Recent Labs    03/19/20 0544 03/20/20 0837  ALBUMIN 2.7* 2.6*       Sedimentation Rate No results for input(s): ESRSEDRATE in the last 72 hours. C-Reactive Protein No results for input(s): CRP in the last 72 hours.  Micro Results: Recent Results (from the past 720 hour(s))  SARS Coronavirus 2 by RT PCR (hospital order, performed in [facility] hospital lab) Nasopharyngeal Nasopharyngeal Swab     Status: None   Collection Time: 03/07/20  1:13 AM   Specimen: Nasopharyngeal Swab  Result Value Ref Range Status   SARS Coronavirus 2 NEGATIVE NEGATIVE Final    Comment: (NOTE) SARS-CoV-2 target nucleic acids are NOT DETECTED.  The SARS-CoV-2 RNA is generally detectable in upper and lower respiratory specimens during the acute phase of infection. The lowest concentration of SARS-CoV-2 viral copies this assay can detect is 250 copies / mL. A negative result does not preclude SARS-CoV-2 infection and should not be used as the sole  basis for treatment or other patient management decisions.  A negative result may occur with improper specimen collection / handling, submission of specimen other than nasopharyngeal swab, presence of viral mutation(s) within the areas targeted by this  assay, and inadequate number of viral copies (<250 copies / mL). A negative result must be combined with clinical observations, patient history, and epidemiological information.  Fact Sheet for Patients:   StrictlyIdeas.no  Fact Sheet for Healthcare Providers: BankingDealers.co.za  This test is not yet approved or  cleared by the [place] FDA and has been authorized for detection and/or diagnosis of SARS-CoV-2 by FDA under an Emergency Use Authorization (EUA).  This EUA will remain in effect (meaning this test can be used) for the duration of the COVID-19 declaration under Section 564(b)(1) of the Act, 21 U.S.C. section 360bbb-3(b)(1), unless the authorization is terminated or revoked sooner.  Performed at [facility], [address], [place], [place] [address]   SARS Coronavirus 2 by RT PCR (hospital order, performed in [facility] hospital lab) Nasopharyngeal Nasopharyngeal Swab     Status: None   Collection Time: 03/10/20 10:32 AM   Specimen: Nasopharyngeal Swab  Result Value Ref Range Status   SARS Coronavirus 2 NEGATIVE NEGATIVE Final    Comment: (NOTE) SARS-CoV-2 target nucleic acids are NOT DETECTED.  The SARS-CoV-2 RNA is generally detectable in upper and lower respiratory specimens during the acute phase of infection. The lowest concentration of SARS-CoV-2 viral copies this assay can detect is 250 copies / mL. A negative result does not preclude SARS-CoV-2 infection and should not be used as the sole basis for treatment or other patient management decisions.  A negative result may occur with improper specimen collection / handling, submission of specimen other than  nasopharyngeal swab, presence of viral mutation(s) within the areas targeted by this assay, and inadequate number of viral copies (<250 copies / mL). A negative result must be combined with clinical observations, patient history, and epidemiological information.  Fact Sheet for Patients:   StrictlyIdeas.no  Fact Sheet for Healthcare Providers: BankingDealers.co.za  This test is not yet approved or  cleared by the [place] FDA and has been authorized for detection and/or diagnosis of SARS-CoV-2 by FDA under an Emergency Use Authorization (EUA).  This EUA will remain in effect (meaning this test can be used) for the duration of the COVID-19 declaration under Section 564(b)(1) of the Act, 21 U.S.C. section 360bbb-3(b)(1), unless the authorization is terminated or revoked sooner.  Performed at [facility], [place] [address] [place], [place] [address]   Blood Culture (routine x 2)     Status: Abnormal   Collection Time: 03/10/20 11:12 AM   Specimen: BLOOD  Result Value Ref Range Status   Specimen Description BLOOD LEFT ANTECUBITAL  Final   Special Requests   Final    BOTTLES DRAWN AEROBIC AND ANAEROBIC Blood Culture adequate volume   Culture  Setup Time   Final    GRAM POSITIVE COCCI IN CHAINS IN BOTH AEROBIC AND ANAEROBIC BOTTLES Organism ID to follow CRITICAL RESULT CALLED TO, READ BACK BY AND VERIFIED WITH: PHARMD VERANDA BRICK AT [place] ON 03/11/2020 Performed at [facility], [place] [address] [place], [place] [address]    Culture ENTEROCOCCUS FAECALIS (A)  Final   Report Status 03/12/2020 FINAL  Final   Organism ID, Bacteria ENTEROCOCCUS FAECALIS  Final      Susceptibility   Enterococcus faecalis - MIC*    AMPICILLIN <=2 SENSITIVE Sensitive     VANCOMYCIN 1 SENSITIVE Sensitive     GENTAMICIN SYNERGY SENSITIVE Sensitive     * ENTEROCOCCUS FAECALIS  Blood Culture ID Panel (Reflexed)     Status:  Abnormal  Collection Time: 03/10/20 11:12 AM  Result Value Ref Range Status   Enterococcus species DETECTED (A) NOT DETECTED Final    Comment: CRITICAL RESULT CALLED TO, READ BACK BY AND VERIFIED WITH: PHARMD VERANDA BRICK AT 0052 BY MESSAN HOUEGNIFIO ON 03/11/2020    Vancomycin resistance NOT DETECTED NOT DETECTED Final   Listeria monocytogenes NOT DETECTED NOT DETECTED Final   Staphylococcus species NOT DETECTED NOT DETECTED Final   Staphylococcus aureus (BCID) NOT DETECTED NOT DETECTED Final   Streptococcus species NOT DETECTED NOT DETECTED Final   Streptococcus agalactiae NOT DETECTED NOT DETECTED Final   Streptococcus pneumoniae NOT DETECTED NOT DETECTED Final   Streptococcus pyogenes NOT DETECTED NOT DETECTED Final   Acinetobacter baumannii NOT DETECTED NOT DETECTED Final   Enterobacteriaceae species NOT DETECTED NOT DETECTED Final   Enterobacter cloacae complex NOT DETECTED NOT DETECTED Final   Escherichia coli NOT DETECTED NOT DETECTED Final   Klebsiella oxytoca NOT DETECTED NOT DETECTED Final   Klebsiella pneumoniae NOT DETECTED NOT DETECTED Final   Proteus species NOT DETECTED NOT DETECTED Final   Serratia marcescens NOT DETECTED NOT DETECTED Final   Haemophilus influenzae NOT DETECTED NOT DETECTED Final   Neisseria meningitidis NOT DETECTED NOT DETECTED Final   Pseudomonas aeruginosa NOT DETECTED NOT DETECTED Final   Candida albicans NOT DETECTED NOT DETECTED Final   Candida glabrata NOT DETECTED NOT DETECTED Final   Candida krusei NOT DETECTED NOT DETECTED Final   Candida parapsilosis NOT DETECTED NOT DETECTED Final   Candida tropicalis NOT DETECTED NOT DETECTED Final    Comment: Performed at [facility], [address], [place], [place] [address]  Blood Culture (routine x 2)     Status: Abnormal   Collection Time: 03/10/20 11:35 AM   Specimen: BLOOD LEFT FOREARM  Result Value Ref Range Status   Specimen Description BLOOD LEFT FOREARM  Final   Special Requests    Final    BOTTLES DRAWN AEROBIC AND ANAEROBIC Blood Culture adequate volume   Culture  Setup Time   Final    GRAM POSITIVE COCCI IN CHAINS IN BOTH AEROBIC AND ANAEROBIC BOTTLES CRITICAL VALUE NOTED.  VALUE IS CONSISTENT WITH PREVIOUSLY REPORTED AND CALLED VALUE.    Culture (A)  Final    ENTEROCOCCUS FAECALIS SUSCEPTIBILITIES PERFORMED ON PREVIOUS CULTURE WITHIN THE LAST 5 DAYS. Performed at [facility], [place] [address] [place], [place] [address]    Report Status 03/12/2020 FINAL  Final  MRSA PCR Screening     Status: Abnormal   Collection Time: 03/10/20  6:08 PM   Specimen: Nasopharyngeal  Result Value Ref Range Status   MRSA by PCR (A) NEGATIVE Final    INVALID, UNABLE TO DETERMINE THE PRESENCE OF TARGET DUE TO SPECIMEN INTEGRITY. RECOLLECTION REQUESTED.    Comment: A,KOTEY @2145  03/10/20 EB Performed at [place] [address] [place], [place] [address]   MRSA PCR Screening     Status: None   Collection Time: 03/11/20  8:17 AM   Specimen: Nasal Mucosa; Nasopharyngeal  Result Value Ref Range Status   MRSA by PCR NEGATIVE NEGATIVE Final    Comment:        The GeneXpert MRSA Assay (FDA approved for NASAL specimens only), is one component of a comprehensive MRSA colonization surveillance program. It is not intended to diagnose MRSA infection nor to guide or monitor treatment for MRSA infections. Performed at [facility], [place] [place] [address], [place], [place] [address]   Urine culture  Status: Abnormal   Collection Time: 03/11/20  9:17 AM   Specimen: In/Out Cath Urine  Result Value Ref Range Status   Specimen Description IN/OUT CATH URINE  Final   Special Requests   Final    NONE Performed at [facility], [address] [place], [place] [address]    Culture MULTIPLE SPECIES PRESENT, SUGGEST RECOLLECTION (A)  Final   Report Status 03/12/2020 FINAL  Final  Culture, blood (routine x 2)     Status: None   Collection Time: 03/12/20 12:13 PM    Specimen: BLOOD LEFT HAND  Result Value Ref Range Status   Specimen Description BLOOD LEFT HAND  Final   Special Requests   Final    BOTTLES DRAWN AEROBIC ONLY Blood Culture results may not be optimal due to an inadequate volume of blood received in culture bottles   Culture   Final    NO GROWTH 5 DAYS Performed at [facility], [place] [address] [place], [place] [address]    Report Status 03/17/2020 FINAL  Final  Culture, blood (routine x 2)     Status: None   Collection Time: 03/12/20  7:17 PM   Specimen: BLOOD LEFT HAND  Result Value Ref Range Status   Specimen Description BLOOD LEFT HAND  Final   Special Requests   Final    BOTTLES DRAWN AEROBIC ONLY Blood Culture results may not be optimal due to an inadequate volume of blood received in culture bottles   Culture   Final    NO GROWTH 5 DAYS Performed at [facility], [place] [address], [place], [place] [address]    Report Status 03/17/2020 FINAL  Final  Culture, blood (Routine X 2) w Reflex to ID Panel     Status: None   Collection Time: 03/14/20  5:59 PM   Specimen: BLOOD LEFT HAND  Result Value Ref Range Status   Specimen Description BLOOD LEFT HAND  Final   Special Requests   Final    BOTTLES DRAWN AEROBIC AND ANAEROBIC Blood Culture adequate volume   Culture   Final    NO GROWTH 5 DAYS Performed at [facility], [place] [address] [place], [place] [address]    Report Status 03/19/2020 FINAL  Final  Culture, blood (Routine X 2) w Reflex to ID Panel     Status: None   Collection Time: 03/14/20 10:09 PM   Specimen: BLOOD  Result Value Ref Range Status   Specimen Description BLOOD LEFT HAND  Final   Special Requests   Final    BOTTLES DRAWN AEROBIC ONLY Blood Culture adequate volume   Culture   Final    NO GROWTH 5 DAYS Performed at [facility], [place] [address], [place], [place] [address]    Report Status 03/19/2020 FINAL  Final  Culture, blood (Routine X 2) w Reflex to ID Panel     Status: Abnormal     Collection Time: 03/17/20  3:54 PM   Specimen: BLOOD LEFT ARM  Result Value Ref Range Status   Specimen Description BLOOD LEFT ARM  Final   Special Requests   Final    BOTTLES DRAWN AEROBIC ONLY Blood Culture adequate volume   Culture  Setup Time   Final    GRAM NEGATIVE RODS AEROBIC BOTTLE ONLY CRITICAL RESULT CALLED TO, READ BACK BY AND VERIFIED WITH: A. Rogers Blocker PharmD 8:45 03/18/20 ([email or web address]) Performed at [facility], [place] [address], [place], [place] [address]  Culture (A)  Final    ESCHERICHIA COLI Confirmed Extended Spectrum Beta-Lactamase Producer (ESBL).  In bloodstream infections from ESBL organisms, carbapenems are preferred over piperacillin/tazobactam. They are shown to have a lower risk of mortality.    Report Status 03/20/2020 FINAL  Final   Organism ID, Bacteria ESCHERICHIA COLI  Final      Susceptibility   Escherichia coli - MIC*    AMPICILLIN >=32 RESISTANT Resistant     CEFAZOLIN >=64 RESISTANT Resistant     CEFEPIME >=32 RESISTANT Resistant     CEFTAZIDIME >=64 RESISTANT Resistant     CEFTRIAXONE >=64 RESISTANT Resistant     CIPROFLOXACIN >=4 RESISTANT Resistant     GENTAMICIN <=1 SENSITIVE Sensitive     IMIPENEM <=0.25 SENSITIVE Sensitive     TRIMETH/SULFA >=320 RESISTANT Resistant     AMPICILLIN/SULBACTAM >=32 RESISTANT Resistant     PIP/TAZO 64 INTERMEDIATE Intermediate     * ESCHERICHIA COLI  Culture, blood (Routine X 2) w Reflex to ID Panel     Status: Abnormal   Collection Time: 03/17/20  3:54 PM   Specimen: BLOOD LEFT ARM  Result Value Ref Range Status   Specimen Description BLOOD LEFT ARM  Final   Special Requests   Final    BOTTLES DRAWN AEROBIC ONLY Blood Culture adequate volume   Culture  Setup Time   Final    GRAM NEGATIVE RODS AEROBIC BOTTLE ONLY CRITICAL VALUE NOTED.  VALUE IS CONSISTENT WITH PREVIOUSLY REPORTED AND CALLED VALUE.    Culture (A)  Final    ESCHERICHIA COLI SUSCEPTIBILITIES PERFORMED ON PREVIOUS CULTURE WITHIN THE LAST  5 DAYS. Performed at [facility], [place] [address] [place], [place] [address]    Report Status 03/20/2020 FINAL  Final  Blood Culture ID Panel (Reflexed)     Status: Abnormal   Collection Time: 03/17/20  3:54 PM  Result Value Ref Range Status   Enterococcus faecalis NOT DETECTED NOT DETECTED Final   Enterococcus Faecium NOT DETECTED NOT DETECTED Final   Listeria monocytogenes NOT DETECTED NOT DETECTED Final   Staphylococcus species NOT DETECTED NOT DETECTED Final   Staphylococcus aureus (BCID) NOT DETECTED NOT DETECTED Final   Staphylococcus epidermidis NOT DETECTED NOT DETECTED Final   Staphylococcus lugdunensis NOT DETECTED NOT DETECTED Final   Streptococcus species NOT DETECTED NOT DETECTED Final   Streptococcus agalactiae NOT DETECTED NOT DETECTED Final   Streptococcus pneumoniae NOT DETECTED NOT DETECTED Final   Streptococcus pyogenes NOT DETECTED NOT DETECTED Final   A.calcoaceticus-baumannii NOT DETECTED NOT DETECTED Final   Bacteroides fragilis NOT DETECTED NOT DETECTED Final   Enterobacterales DETECTED (A) NOT DETECTED Final    Comment: Enterobacterales represent a large order of gram negative bacteria, not a single organism. CRITICAL RESULT CALLED TO, READ BACK BY AND VERIFIED WITH: A. Rogers Blocker PharmD 8:45 03/18/20 ([email or web address])    Enterobacter cloacae complex NOT DETECTED NOT DETECTED Final   Escherichia coli DETECTED (A) NOT DETECTED Final    Comment: CRITICAL RESULT CALLED TO, READ BACK BY AND VERIFIED WITH: A. Rogers Blocker PharmD 8:45 03/18/20 ([email or web address])    Klebsiella aerogenes NOT DETECTED NOT DETECTED Final   Klebsiella oxytoca NOT DETECTED NOT DETECTED Final   Klebsiella pneumoniae NOT DETECTED NOT DETECTED Final   Proteus species NOT DETECTED NOT DETECTED Final   Salmonella species NOT DETECTED NOT DETECTED Final   Serratia marcescens NOT DETECTED NOT DETECTED Final   Haemophilus influenzae NOT DETECTED NOT DETECTED Final   Neisseria meningitidis NOT DETECTED NOT DETECTED  Final  Pseudomonas aeruginosa NOT DETECTED NOT DETECTED Final   Stenotrophomonas maltophilia NOT DETECTED NOT DETECTED Final   Candida albicans NOT DETECTED NOT DETECTED Final   Candida auris NOT DETECTED NOT DETECTED Final   Candida glabrata NOT DETECTED NOT DETECTED Final   Candida krusei NOT DETECTED NOT DETECTED Final   Candida parapsilosis NOT DETECTED NOT DETECTED Final   Candida tropicalis NOT DETECTED NOT DETECTED Final   Cryptococcus neoformans/gattii NOT DETECTED NOT DETECTED Final   CTX-M ESBL DETECTED (A) NOT DETECTED Final    Comment: CRITICAL RESULT CALLED TO, READ BACK BY AND VERIFIED WITH: A. Rogers Blocker PharmD 8:45 03/18/20 ([email or web address]) (NOTE) Extended spectrum beta-lactamase detected. Recommend a carbapenem as initial therapy.      Carbapenem resistance IMP NOT DETECTED NOT DETECTED Final   Carbapenem resistance KPC NOT DETECTED NOT DETECTED Final   Carbapenem resistance NDM NOT DETECTED NOT DETECTED Final   Carbapenem resist OXA 48 LIKE NOT DETECTED NOT DETECTED Final   Carbapenem resistance VIM NOT DETECTED NOT DETECTED Final    Comment: Performed at [facility], [place] [address] [place], [place] [address]  Culture, blood (routine x 2)     Status: None (Preliminary result)   Collection Time: 03/19/20  4:02 PM   Specimen: BLOOD LEFT ARM  Result Value Ref Range Status   Specimen Description BLOOD LEFT ARM  Final   Special Requests   Final    BOTTLES DRAWN AEROBIC ONLY Blood Culture adequate volume   Culture   Final    NO GROWTH < 24 HOURS Performed at [facility], [place] [address] [place], [place] [address]    Report Status PENDING  Incomplete  Culture, blood (routine x 2)     Status: None (Preliminary result)   Collection Time: 03/19/20  4:02 PM   Specimen: BLOOD LEFT ARM  Result Value Ref Range Status   Specimen Description BLOOD LEFT ARM  Final   Special Requests   Final    BOTTLES DRAWN AEROBIC ONLY Blood Culture adequate volume   Culture   Final     NO GROWTH < 24 HOURS Performed at [facility], [place] [address], [place], [place] [address]    Report Status PENDING  Incomplete    Studies/Results: DG Abd 1 View  Result Date: 03/20/2020 CLINICAL DATA:  Nausea and vomiting EXAM: ABDOMEN - 1 VIEW COMPARISON:  03/18/2020 FINDINGS: Scattered large and small bowel gas is noted. Contrast material is noted throughout the colon from recent CT. No obstructive changes are noted. No free air is noted. IMPRESSION: No obstructive abnormality is noted. Electronically Signed   By: Inez Catalina M.D.   On: 03/20/2020 16:02      Assessment/Plan:  INTERVAL HISTORY: ESBL grewon repeat cultures and she has been broadened to Carbapenem therapy   Principal Problem:   Bacteremia Active Problems:   Type 2 diabetes mellitus with diabetic polyneuropathy, without long-term current use of insulin (HCC)   Diabetic peripheral neuropathy (HCC)   HYPERCHOLESTEROLEMIA   Gout, unspecified   Schizophrenia ([place])   Essential hypertension   Acute on chronic diastolic CHF (congestive heart failure) (HCC)   Hypothyroidism   ESRD (end stage renal disease) (HCC)   Elevated troponin   Acute on chronic respiratory failure with hypoxia (HCC)   Atrial fibrillation with RVR (HCC)   Macrocytic anemia   Thrombocytopenia (HCC)   Hyperkalemia   Septic shock (HCC)   Elevated liver enzymes   Palliative care by specialist   Pleural effusion  [name] is a 69 y.o. female with history of end-stage renal disease on hemodialysis, COPD admitted with sepsis and found to have Enterococcus faecalis bacteremia. He was critically ill on pressors and CRRT. Sepsis has resolved and hemodialysis catheter is out. She now is struggling atrial fibrillation with a rapid ventricular response.  She now has ESBL bacteremia  1. Enterococcal bacteremia   She is now on imipenem  I do NOT see her getting TEE any time during his admission or any time soon  IF she is able to  get TEE and has endocarditis please call us back   Otherwise I will plan on 2 weeks of carbapenem for #2 and stop abx  One could check blood cultures (surveillance ones) 2 + weeks later   2 ESBL bacteremia:  Finish 2 weeks of IV carbapenem therapy from date of negative blood cultures   I will sign off for now.  Please call us back if she is found to have endocarditis.  LOS: 11 days   Alcide Evener 03/21/2020, 12:32 PM

## 2020-03-21 NOTE — Progress Notes (Signed)
Patient ID: [name], female   DOB: April 28, 1951, 69 y.o.   MRN: [id number] S: Feels better today  O:BP 127/79   Pulse 100   Temp 97.7 F (36.5 C)   Resp 16   Ht 5\' 2"  (1.575 m)   Wt 87.7 kg   SpO2 100%   BMI 35.36 kg/m   Intake/Output Summary (Last 24 hours) at 03/21/2020 0932 Last data filed at 03/20/2020 0951 Gross per 24 hour  Intake 100 ml  Output --  Net 100 ml   Intake/Output: I/O last 3 completed shifts: In: 100 [IV Piggyback:100] Out: -   Intake/Output this shift:  No intake/output data recorded. Weight change: -0.1 kg Gen: obese WF sitting in bed eating breakfast in NAD CVS: IRR IRR  Resp: decreased BS at bases Abd: +BS, soft, Nt/nd Ext: trace edema, rue avg +t/B  Recent Labs  Lab 03/14/20 1812 03/15/20 0500 03/16/20 0535 03/17/20 0328 03/18/20 0727 03/19/20 0544 03/20/20 0837  NA  --  138 136 138 138 136 135  K  --  3.9 4.0 3.6 4.1 4.3 4.0  CL  --  100 98 99 98 95* 95*  CO2  --  25 25 26 29 29 29   GLUCOSE  --  326* 394* 151* 69* 65* 139*  BUN  --  38* 62* 73* 35* 46* 28*  CREATININE  --  2.99* 4.06* 4.78* 3.28* 4.06* 2.92*  ALBUMIN 2.8* 2.6* 2.8* 2.7* 2.8*  2.7* 2.7* 2.6*  CALCIUM  --  8.6* 9.0 9.4 9.2 9.6 9.2  PHOS  --  3.7 3.2 2.6 2.6 2.3* 2.6  AST 366* 181*  --   --  38  --   --   ALT 1,393* 1,118*  --   --  487*  --   --    Liver Function Tests: Recent Labs  Lab 03/14/20 1812 03/14/20 1812 03/15/20 0500 03/16/20 0535 03/18/20 0727 03/19/20 0544 03/20/20 0837  AST 366*  --  181*  --  38  --   --   ALT 1,393*  --  1,118*  --  487*  --   --   ALKPHOS 145*  --  129*  --  117  --   --   BILITOT 1.6*  --  1.2  --  0.8  --   --   PROT 4.9*  --  4.8*  --  5.0*  --   --   ALBUMIN 2.8*   < > 2.6*   < > 2.8*  2.7* 2.7* 2.6*   < > = values in this interval not displayed.   No results for input(s): LIPASE, AMYLASE in the last 168 hours. No results for input(s): AMMONIA in the last 168 hours. CBC: Recent Labs  Lab 03/16/20 0535  03/16/20 0535 03/17/20 0328 03/17/20 0328 03/18/20 0727 03/19/20 0544 03/20/20 0837  WBC 10.0   < > 11.0*   < > 12.9* 15.3* 13.7*  HGB 8.4*   < > 8.9*   < > 9.6* 9.3* 9.8*  HCT 26.8*   < > 27.6*   < > 30.6* 29.9* 30.2*  MCV 112.6*  --  111.7*  --  112.9* 112.4* 110.2*  PLT 74*   < > 89*   < > 95* 123* 132*   < > = values in this interval not displayed.   Cardiac Enzymes: No results for input(s): CKTOTAL, CKMB, CKMBINDEX, TROPONINI in the last 168 hours. CBG: Recent Labs  Lab 03/20/20 2111 03/21/20 0447 03/21/20  0533 03/21/20 0559 03/21/20 0637  GLUCAP 193* 51* 49* 45* 70    Iron Studies: No results for input(s): IRON, TIBC, TRANSFERRIN, FERRITIN in the last 72 hours. Studies/Results: DG Abd 1 View  Result Date: 03/20/2020 CLINICAL DATA:  Nausea and vomiting EXAM: ABDOMEN - 1 VIEW COMPARISON:  03/18/2020 FINDINGS: Scattered large and small bowel gas is noted. Contrast material is noted throughout the colon from recent CT. No obstructive changes are noted. No free air is noted. IMPRESSION: No obstructive abnormality is noted. Electronically Signed   By: Inez Catalina M.D.   On: 03/20/2020 16:02   . amiodarone  200 mg Oral BID  . ARIPiprazole  2 mg Oral Daily  . aspirin EC  81 mg Oral Q breakfast  . buPROPion  150 mg Oral Daily  . calcitRIOL  0.25 mcg Oral Q M,W,F  . calcium acetate  2,001 mg Oral TID WC  . Chlorhexidine Gluconate Cloth  6 each Topical Daily  . diltiazem  360 mg Oral Daily  . feeding supplement (ENSURE ENLIVE)  237 mL Oral BID BM  . insulin aspart  0-5 Units Subcutaneous QHS  . insulin aspart  0-6 Units Subcutaneous TID WC  . insulin aspart  4 Units Subcutaneous TID WC  . insulin detemir  18 Units Subcutaneous BID  . levETIRAcetam  250 mg Oral BID  . levothyroxine  50 mcg Oral QAC breakfast  . mouth rinse  15 mL Mouth Rinse BID  . metoprolol tartrate  100 mg Oral BID  . midodrine  5 mg Oral TID WC  . montelukast  10 mg Oral QHS  . multivitamin  1 tablet  Oral QHS  . pantoprazole  40 mg Oral Daily  . predniSONE  10 mg Oral Q breakfast  . primidone  50 mg Oral Daily  . sodium chloride flush  10-40 mL Intracatheter Q12H  . traZODone  50 mg Oral QHS    BMET    Component Value Date/Time   NA 135 03/20/2020 0837   K 4.0 03/20/2020 0837   CL 95 (L) 03/20/2020 0837   CO2 29 03/20/2020 0837   GLUCOSE 139 (H) 03/20/2020 0837   BUN 28 (H) 03/20/2020 0837   CREATININE 2.92 (H) 03/20/2020 0837   CREATININE 2.54 (H) 08/02/2018 1411   CALCIUM 9.2 03/20/2020 0837   GFRNONAA 16 (L) 03/20/2020 0837   GFRAA 18 (L) 03/20/2020 0837   CBC    Component Value Date/Time   WBC 13.7 (H) 03/20/2020 0837   RBC 2.74 (L) 03/20/2020 0837   HGB 9.8 (L) 03/20/2020 0837   HCT 30.2 (L) 03/20/2020 0837   PLT 132 (L) 03/20/2020 0837   MCV 110.2 (H) 03/20/2020 0837   MCH 35.8 (H) 03/20/2020 0837   MCHC 32.5 03/20/2020 0837   RDW 15.6 (H) 03/20/2020 0837   LYMPHSABS 1.3 03/07/2020 0044   MONOABS 0.9 03/07/2020 0044   EOSABS 0.1 03/07/2020 0044   BASOSABS 0.0 03/07/2020 0044   [facility] Bath2/2.5, Dialyzerunknown, Heparindoes not appear to receive heparin with her treatment, only in the catheter. Accessusing TDC.  Assessment/Plan: 1. Enterococcal bacteremia with septic shock- resolvedbut will need TEE to r/o SBE.  Unfortunately pt not able to give informed consent and HCPOA has not returned any calls from cardiology. 2. ESRD- off schedule since transitioning from CRRT to IHD. Will get back on schedule later this week.HD on 03/19/20.  Will plan for HD on 03/22/20 to get back on her outpatient schedule.  3. Vascular access- TDC removed 7/29, using RAVG. 4. A fib with RVR- on amio. 5. Hypotension- improved with midodrine. 6. AMS- waxes and wanes. 7. Acute Hypoxic respiratory failure- currently on Bazine 8. ABLA- from [facility] central line. S/p transfusion. Eliquis held and FFP  given. 9. Thrombocytopenia- per primary. 10. Elevated ALT- improving. 11. N/V- new this morning.  Workup per primary. 12. Disposition- pt has not had significant improvement and remains confused.  Recommend palliative care consult to help set goals/limits of care as she is still a full code.  Donetta Potts, MD [employer] [phone]

## 2020-03-21 NOTE — Progress Notes (Addendum)
Progress Note  Patient Name: [name] Date of Encounter: 03/21/2020  Primary Cardiologist: Rozann Lesches, MD  Subjective   Patient sleeping lying flat, easily aroused but quickly falls back asleep and also appears somewhat spacey at times similar to . Reported nausea yesterday, now much improved. No chest pain. Denies SOB. Reports foot/leg pain.  Inpatient Medications    Scheduled Meds: . amiodarone  200 mg Oral BID  . ARIPiprazole  2 mg Oral Daily  . aspirin EC  81 mg Oral Q breakfast  . buPROPion  150 mg Oral Daily  . calcitRIOL  0.25 mcg Oral Q M,W,F  . calcium acetate  2,001 mg Oral TID WC  . Chlorhexidine Gluconate Cloth  6 each Topical Daily  . diltiazem  360 mg Oral Daily  . feeding supplement (ENSURE ENLIVE)  237 mL Oral BID BM  . insulin aspart  0-5 Units Subcutaneous QHS  . insulin aspart  0-6 Units Subcutaneous TID WC  . insulin aspart  4 Units Subcutaneous TID WC  . insulin detemir  28 Units Subcutaneous BID  . levETIRAcetam  250 mg Oral BID  . levothyroxine  50 mcg Oral QAC breakfast  . mouth rinse  15 mL Mouth Rinse BID  . metoprolol tartrate  100 mg Oral BID  . midodrine  5 mg Oral TID WC  . montelukast  10 mg Oral QHS  . multivitamin  1 tablet Oral QHS  . pantoprazole  40 mg Oral Daily  . predniSONE  10 mg Oral Q breakfast  . primidone  50 mg Oral Daily  . sodium chloride flush  10-40 mL Intracatheter Q12H  . Thrombi-Pad  1 each Topical Once  . traZODone  50 mg Oral QHS   Continuous Infusions: . sodium chloride 250 mL (03/17/20 1309)  . imipenem-cilastatin 500 mg (03/20/20 2126)   PRN Meds: acetaminophen, albuterol, calcium carbonate, diphenhydrAMINE, docusate sodium, EPINEPHrine, heparin, HYDROcodone-acetaminophen, metoprolol tartrate, nitroGLYCERIN, ondansetron (ZOFRAN) IV, Resource ThickenUp Clear, sodium chloride flush   Vital Signs    Vitals:   03/20/20 2114 03/20/20 2217 03/20/20 2305 03/21/20 0438  BP:  (!) 141/96  127/79  Pulse:  97 (!) 101 99 100  Resp:  17 18 16   Temp:  98.2 F (36.8 C)  97.7 F (36.5 C)  TempSrc:      SpO2:  100%  100%  Weight:    87.7 kg  Height:        Intake/Output Summary (Last 24 hours) at 03/21/2020 0735 Last data filed at 03/20/2020 0951 Gross per 24 hour  Intake 100 ml  Output --  Net 100 ml   Last 3 Weights 03/21/2020 03/20/2020 03/19/2020  Weight (lbs) 193 lb 5.5 oz 190 lb 0.6 oz 188 lb 7.9 oz  Weight (kg) 87.7 kg 86.2 kg 85.5 kg     Telemetry    Atrial fib with RBBB rates 80s-90s, QTc 530s on tele - Personally Reviewed  Physical Exam   GEN: Chronically ill appearing WF HEENT: Normocephalic, atraumatic, sclera non-icteric. Neck: No JVD or bruits. Cardiac: Irregularly irregular, rate controlled, no murmurs, rubs, or gallops.  Radials/DP/PT 1+ and equal bilaterally.  Respiratory: Clear to auscultation bilaterally. Breathing is unlabored. GI: Soft, nontender, non-distended, BS +x 4. MS: no deformity. Purpura noted on hands Extremities: No clubbing or cyanosis. 1+ BLE edema but challenging to assess given patient in heel protectors. Distal pedal pulses are 2+ and equal bilaterally. Neuro:  Sleepy but arousable, knows [facility], 2021, not the date, falls back  asleep easily. Follows simple commands. Psych: Flat affect.  Labs    High Sensitivity Troponin:   Recent Labs  Lab 03/10/20 1032 03/10/20 1323  TROPONINIHS 42* 56*      Cardiac EnzymesNo results for input(s): TROPONINI in the last 168 hours. No results for input(s): TROPIPOC in the last 168 hours.   Chemistry Recent Labs  Lab 03/14/20 1812 03/14/20 1812 03/15/20 0500 03/16/20 0535 03/18/20 0727 03/19/20 0544 03/20/20 0837  NA  --   --  138   < > 138 136 135  K  --   --  3.9   < > 4.1 4.3 4.0  CL  --   --  100   < > 98 95* 95*  CO2  --   --  25   < > 29 29 29   GLUCOSE  --   --  326*   < > 69* 65* 139*  BUN  --   --  38*   < > 35* 46* 28*  CREATININE  --   --  2.99*   < > 3.28* 4.06* 2.92*  CALCIUM  --   --   8.6*   < > 9.2 9.6 9.2  PROT 4.9*  --  4.8*  --  5.0*  --   --   ALBUMIN 2.8*   < > 2.6*   < > 2.8*  2.7* 2.7* 2.6*  AST 366*  --  181*  --  38  --   --   ALT 1,393*  --  1,118*  --  487*  --   --   ALKPHOS 145*  --  129*  --  117  --   --   BILITOT 1.6*  --  1.2  --  0.8  --   --   GFRNONAA  --   --  15*   < > 14* 11* 16*  GFRAA  --   --  18*   < > 16* 12* 18*  ANIONGAP  --   --  13   < > 11 12 11    < > = values in this interval not displayed.     Hematology Recent Labs  Lab 03/18/20 0727 03/19/20 0544 03/20/20 0837  WBC 12.9* 15.3* 13.7*  RBC 2.71* 2.66* 2.74*  HGB 9.6* 9.3* 9.8*  HCT 30.6* 29.9* 30.2*  MCV 112.9* 112.4* 110.2*  MCH 35.4* 35.0* 35.8*  MCHC 31.4 31.1 32.5  RDW 15.9* 15.9* 15.6*  PLT 95* 123* 132*    BNPNo results for input(s): BNP, PROBNP in the last 168 hours.   DDimer No results for input(s): DDIMER in the last 168 hours.   Radiology    DG Abd 1 View  Result Date: 03/20/2020 CLINICAL DATA:  Nausea and vomiting EXAM: ABDOMEN - 1 VIEW COMPARISON:  03/18/2020 FINDINGS: Scattered large and small bowel gas is noted. Contrast material is noted throughout the colon from recent CT. No obstructive changes are noted. No free air is noted. IMPRESSION: No obstructive abnormality is noted. Electronically Signed   By: Inez Catalina M.D.   On: 03/20/2020 16:02    Cardiac Studies   2D echo 03/05/20 1. Challenging to estimate EF with underlying tachycardia, however does  not appear severly reduced.  2. Left ventricular ejection fraction, by estimation, is 45 to 50%. The  left ventricle has mildly decreased function. The left ventricle  demonstrates global hypokinesis. Left ventricular diastolic parameters are  indeterminate.  3. Right ventricular systolic function is mildly reduced. The  right  ventricular size is moderately enlarged. There is moderately elevated  pulmonary artery systolic pressure. The estimated right ventricular  systolic pressure is 73.2  mmHg.  4. Left atrial size was severely dilated.  5. Right atrial size was moderately dilated.  6. The mitral valve is normal in structure. Mild mitral valve  regurgitation. No evidence of mitral stenosis.  7. Tricuspid valve regurgitation is moderate to severe.  8. The aortic valve is tricuspid. Aortic valve regurgitation is not  visualized. Mild to moderate aortic valve sclerosis/calcification is  present, without any evidence of aortic stenosis.  9. The inferior vena cava is dilated in size with <50% respiratory  variability, suggesting right atrial pressure of 15 mmHg  Patient Profile     69 y.o. female chronic diastolic HF, persistent atrial fibrillation/flutter on anticoagulation with Eliquis, anemia, mild to moderate aortic stenosis (only sclerosis seen this admission), ESRD on HD, HTN, HLD, bifascicular block(known RBBB+LAFB), COPD, DM 2, history of PE, seizure disorder, schizophrenia, Barrett's esophagus, memory loss and OSAwho was admitted with septic shock due to enterococcus faecalis, RLL PNA. Cardiology following for atrial fib RVR. 2D Echo was challenging to estimate EF due to tachycardia but showing LVEF 45-50%, global HK, mildly reduced RV function, moderately elevated PASP, LA severely dilated, moderately dilated RA, moderate-severe TR, moderate-severe aortic sclerosis without stenosis. Also with ESBL E. Coli bacteremia on 03/17/20 blood cultures. ID feels lines were likely source of bacteremia. Other hospital issues include bleeding subclavian line, thrombocytopenia requiring 1 unit of platelets and 2 units FFP, holding of anticoagulation, acute on chronic respiratory failure, acute metabolic/toxic encephalopathy, anemia, transaminitis due to shock liver, elevated troponins, hyperkalemia, moderate right pleural effusion suspicious for serosanguinous or hemorrhagic fluid, and stage II pressure ulcer on heel. ID also following who has recommended TEE when stable, but there have  been difficulties obtaining consent or contacting guardian.  Assessment & Plan    1. Septic shock due to bacteremia (Enteroccocus faecalis on initial cultures, then ESBL bacteremia) with other issues noted above - patient is non-consentable at this time for TEE due to waxing/waning mental status, have been unable to reach niece on file despite numerous attempts over 3 days time - palliative care consult has been ordered by primary team along with transition of care team to help identify/contact who can definitively help establish medical decisions for this patient - TEE remains on deferral until this is clarified  2. Persistent atrial fibrillation in context of numerous metabolic stressors - TSH wnl - not currently a candidate for DCCV given intermittent holding of Eliquis this admission due to above issues, can revisit when consistently clinically stable - notes indicate deferral to cardiology of when to resume Eliquis - was on heparin up until 7/28 but has not been anticoagulated since that time except for aspirin. Petechial/purpural appearance to hands noted, Dr. Claiborne Billings wishes to defer to medical team to start heparin when they feel she is safe to do so, await their input about pleural effusion as well (?possibly hemorrhagic) - labs still pending today - rates actually much better this AM on present regimen so would continue - if nausea remains an issue, can decrease amiodarone down to 200mg  daily - limited options otherwise - poor candidate for digoxin due to ESRD and not a candidate for more aggressive intervention like AVN ablation + PPM due to infective issues - severely dilated LA so may not even be able to hold sinus even if pursued  3. Acute combined CHF (possible mild reduction  in LV dysfunction in setting of tachycardia) - blood pressure OK - still requiring O2 - volume removal is per HD/nephrology  4. Pleural effusion, right - higher attenuating content suspicious for a  serosanguineous or hemorrhagic fluid - per primary team ?evaluation to determine composition of fluid  5. Mildly elevated troponin 42->56 - not felt to reflect ACS, likely demand process - no ischemic workup planned  6. Leg pain - patient is not currently on any pharmacologic DVT ppx - consider duplex - will defer to primary team   For questions or updates, please contact [place] Please consult [email or web address] for contact info under Cardiology/STEMI.  Signed, Charlie Pitter, PA-C 03/21/2020, 7:35 AM     Patient seen and examined. Agree with assessment and plan. Continues to be in AF with rate control now in the 80 - 90 range; severe LA enlargement makes liklihood of sinus rhythm less and if converted significant chance for recurrence. Plts increased to 132K today; H/H stable. Consider heparin at low dose if feel patient is candidate at present. Uncertain if can get consent for TEE   Troy Sine, MD, [facility] 03/21/2020 5:28 PM

## 2020-03-22 DIAGNOSIS — R0602 Shortness of breath: Secondary | ICD-10-CM | POA: Diagnosis not present

## 2020-03-22 DIAGNOSIS — N186 End stage renal disease: Secondary | ICD-10-CM | POA: Diagnosis not present

## 2020-03-22 DIAGNOSIS — J9621 Acute and chronic respiratory failure with hypoxia: Secondary | ICD-10-CM | POA: Diagnosis not present

## 2020-03-22 DIAGNOSIS — A4181 Sepsis due to Enterococcus: Secondary | ICD-10-CM | POA: Diagnosis not present

## 2020-03-22 DIAGNOSIS — I4891 Unspecified atrial fibrillation: Secondary | ICD-10-CM | POA: Diagnosis not present

## 2020-03-22 DIAGNOSIS — Z515 Encounter for palliative care: Secondary | ICD-10-CM | POA: Diagnosis not present

## 2020-03-22 DIAGNOSIS — R7881 Bacteremia: Secondary | ICD-10-CM | POA: Diagnosis not present

## 2020-03-22 LAB — CBC
HCT: 28.7 % — ABNORMAL LOW (ref 36.0–46.0)
Hemoglobin: 9 g/dL — ABNORMAL LOW (ref 12.0–15.0)
MCH: 34.7 pg — ABNORMAL HIGH (ref 26.0–34.0)
MCHC: 31.4 g/dL (ref 30.0–36.0)
MCV: 110.8 fL — ABNORMAL HIGH (ref 80.0–100.0)
Platelets: 141 10*3/uL — ABNORMAL LOW (ref 150–400)
RBC: 2.59 MIL/uL — ABNORMAL LOW (ref 3.87–5.11)
RDW: 15.3 % (ref 11.5–15.5)
WBC: 10.5 10*3/uL (ref 4.0–10.5)
nRBC: 0 % (ref 0.0–0.2)

## 2020-03-22 LAB — RENAL FUNCTION PANEL
Albumin: 2.6 g/dL — ABNORMAL LOW (ref 3.5–5.0)
Anion gap: 12 (ref 5–15)
BUN: 49 mg/dL — ABNORMAL HIGH (ref 8–23)
CO2: 24 mmol/L (ref 22–32)
Calcium: 9.8 mg/dL (ref 8.9–10.3)
Chloride: 93 mmol/L — ABNORMAL LOW (ref 98–111)
Creatinine, Ser: 4.36 mg/dL — ABNORMAL HIGH (ref 0.44–1.00)
GFR calc Af Amer: 11 mL/min — ABNORMAL LOW (ref >60)
GFR calc non Af Amer: 10 mL/min — ABNORMAL LOW (ref >60)
Glucose, Bld: 170 mg/dL — ABNORMAL HIGH (ref 70–99)
Phosphorus: 4 mg/dL (ref 2.5–4.6)
Potassium: 4.4 mmol/L (ref 3.5–5.1)
Sodium: 129 mmol/L — ABNORMAL LOW (ref 135–145)

## 2020-03-22 LAB — GLUCOSE, CAPILLARY
Glucose-Capillary: 170 mg/dL — ABNORMAL HIGH (ref 70–99)
Glucose-Capillary: 91 mg/dL (ref 70–99)
Glucose-Capillary: 173 mg/dL — ABNORMAL HIGH (ref 70–99)
Glucose-Capillary: 212 mg/dL — ABNORMAL HIGH (ref 70–99)

## 2020-03-22 LAB — MAGNESIUM: Magnesium: 2.6 mg/dL — ABNORMAL HIGH (ref 1.7–2.4)

## 2020-03-22 LAB — HEPARIN LEVEL (UNFRACTIONATED): Heparin Unfractionated: 0.28 IU/mL — ABNORMAL LOW (ref 0.30–0.70)

## 2020-03-22 MED ORDER — HEPARIN (PORCINE) 25000 UT/250ML-% IV SOLN
800.0000 [IU]/h | INTRAVENOUS | Status: DC
  Administered 2020-03-22: 800 [IU]/h via INTRAVENOUS
  Filled 2020-03-22: qty 250

## 2020-03-22 NOTE — Plan of Care (Signed)
  Problem: Education: Goal: Knowledge of General Education information will improve Description: Including pain rating scale, medication(s)/side effects and non-pharmacologic comfort measures Outcome: Progressing   Problem: Activity: Goal: Risk for activity intolerance will decrease Outcome: Progressing   Problem: Coping: Goal: Level of anxiety will decrease Outcome: Progressing   Problem: Nutrition: Goal: Adequate nutrition will be maintained Outcome: Not Progressing

## 2020-03-22 NOTE — Progress Notes (Signed)
PROGRESS NOTE    [name]  YIF:[id number] DOB: 05-01-1951 DOA: 03/10/2020 PCP: Glenda Chroman, MD   Brief Narrative:   Kathleen Boop Archeris an 69 y.o.femalepast medical history of chronic respiratory failure with hypoxia secondary to underlying COPD on 3 L of oxygen, A. fib on Eliquis end-stage renal disease on hemodialysis chronic diastolic heart failure moderate AS, who came into the hospital for hyperglycemia and tachycardia was found in A. fib with RVR, decompensated systolic heart failure and septic shock who presented to the ED with atrial fibrillation and RVR with a heart rate of 170 was started on a diltiazem drip on arrival to the ED heart rate improved however she developed progressive hypotension and was transitioned to amiodarone due to hypotension PCCM was consulted and started on pressors for septic shock due to right lower lobe pneumonia.  8/7: PC has been able to contact family. They will be here tomorrow. Can get consent then. Will need to discuss long term prognosis with her. Let's start her on low dose heparin gtt. If tolerates, can resume eliquis. Continue abx as previously discussed.    Assessment & Plan: Septic shock due to Enterococcus faecalis bacteremia probably due to her HD catheter E coli bacteremia - ID was consulted he was sensitive to Vanco, then switch to Zyvox, then ampicillin now on imipenem due to blood cultures growing ESBL E. coli. - TEE pending - Central line discontinued on 03/16/2020. - Tunnel catheter successfully removed on 03/13/2020. - Bld Cx on 03/10/2020 for Enterococcus. - Bld Cx cultures on 03/17/2020 show ESBL E. coli -8/7: family will be here tomorrow, can get consent for TEE then.; continue abx for 2 weeks per ID.  Bleeding subclavian line Thrombocytopenia - s/p 1 unit of platelets and 2 units of fresh frozen plasma - Eliquis was held - plts are trending up, no new bleed noted - Hgb/plts ok.  Will start low dose heparin gtt today. If tolerates, transition to eliquis   Persistent atrial fibrillation with RVR: - Probably driven by her infectious bacteremia. - Due to heart control heart rate EP was consulted recommended to continue amiodarone, metoprolol and diltiazem - see thrombocytopenia above  Acute on chronic respiratory failure with hypoxia acute decompensated diastolic heart failure Pleural effusion - on baseline O2 use - volume control w/ HD     - w/ baseline O2 use and small effusion, no further w/u at this time  N/V - no stools documented - PRN anti-emetics     - KUB negative     - N/V improved, tolerating diet   Acute metabolic/toxic encephalopathy: - neurology eval'd; rec'd continuing primidone and Keppra - last ammonia was 50 - encephalopathy is resolved.  ESRD on HD - per nephrology  Transaminitis - Likely due to shock liver - RUQ US showed no obstructive jaundice. - Transaminases are improving.  Insulin-dependent diabetes mellitus type 2 - A1c: 8.0     - Had hypoglycemic episodes ON, decrease levemir to 18 units BID - continue SSI, DM diet  Elevated troponins - Likely due to demand ischemia, further management per cardiology.  Hypothyroidism: - Continue Synthroid.  Rheumatoid arthritis: - On chronic prednisone continue current regimen.  Gout - denies complaints.  - follow  Schizophrenia - on welbutrin, abilify  Hyperkalemia - resolved on HD  Stage II ulcer present on admission: - RN Pressure Injury Documentation: - Pressure Injury 03/10/20 Heel Left Unstageable - Full thickness tissue loss in which the base of the injury is covered by slough (  yellow, tan, gray, green or brown) and/or eschar (tan, brown or black) in the wound bed. 3 cm x 2 cm (Active) 03/10/20 1730 Location: Heel Location Orientation: Left Staging:  Unstageable - Full thickness tissue loss in which the base of the injury is covered by slough (yellow, tan, gray, green or brown) and/or eschar (tan, brown or black) in the wound bed. Wound Description (Comments): 3 cm x 2 cm Present on Admission: Yes  DVT prophylaxis: heparin gtt/SCDs Code Status: DNR Family Communication: None at bedside   Status is: Inpatient  Remains inpatient appropriate because:Inpatient level of care appropriate due to severity of illness   Dispo: The patient is from: Home              Anticipated d/c is to: SNF              Anticipated d/c date is: > 3 days              Patient currently is not medically stable to d/c.  Consultants:   Cardiology  Nephrology  Palliative Care  Antimicrobials:  . Primaxin   ROS:  Denies N, V, ab pain, CP, dyspnea . Remainder ROS is negative for all not previously mentioned.  Subjective: "I can do that."  Objective: Vitals:   03/22/20 1100 03/22/20 1122 03/22/20 1224 03/22/20 1230  BP: 114/69 112/75 109/66 109/66  Pulse: 95 95 98 93  Resp: 16 19  18   Temp:  98 F (36.7 C)  98.4 F (36.9 C)  TempSrc:  Oral  Oral  SpO2: 100% 100%  98%  Weight:      Height:        Intake/Output Summary (Last 24 hours) at 03/22/2020 1432 Last data filed at 03/22/2020 1300 Gross per 24 hour  Intake 480 ml  Output 3500 ml  Net -3020 ml   Filed Weights   03/21/20 0438 03/22/20 0500 03/22/20 0715  Weight: 87.7 kg 89.6 kg 89.5 kg    Examination:  General: 69 y.o. female resting in bed in NAD Cardiovascular: RRR, +S1, S2, no m/g/r Respiratory: CTABL, no w/r/r, normal WOB GI: BS+, NDNT, no masses noted, no organomegaly noted MSK: No e/c/c Neuro: Alert to name, follows commands Psyc: Appropriate interaction and affect, calm/cooperative   Data Reviewed: I have personally reviewed following labs and imaging studies.  CBC: Recent Labs  Lab 03/18/20 0727 03/19/20 0544 03/20/20 0837 03/21/20 1822 03/22/20 0725  WBC  12.9* 15.3* 13.7* 12.5* 10.5  NEUTROABS  --   --   --  11.5*  --   HGB 9.6* 9.3* 9.8* 10.1* 9.0*  HCT 30.6* 29.9* 30.2* 31.5* 28.7*  MCV 112.9* 112.4* 110.2* 110.9* 110.8*  PLT 95* 123* 132* 136* 416*   Basic Metabolic Panel: Recent Labs  Lab 03/18/20 0727 03/19/20 0544 03/20/20 0837 03/21/20 1822 03/22/20 0725  NA 138 136 135 132* 129*  K 4.1 4.3 4.0 5.1 4.4  CL 98 95* 95* 94* 93*  CO2 29 29 29 25 24   GLUCOSE 69* 65* 139* 254* 170*  BUN 35* 46* 28* 44* 49*  CREATININE 3.28* 4.06* 2.92* 4.07* 4.36*  CALCIUM 9.2 9.6 9.2 10.0 9.8  MG  --   --   --  2.3 2.6*  PHOS 2.6 2.3* 2.6 3.8 4.0   GFR: Estimated Creatinine Clearance: 12.8 mL/min (A) (by C-G formula based on SCr of 4.36 mg/dL (H)). Liver Function Tests: Recent Labs  Lab 03/18/20 0727 03/19/20 0544 03/20/20 0837 03/21/20 1822 03/22/20 0725  AST  38  --   --   --   --   ALT 487*  --   --   --   --   ALKPHOS 117  --   --   --   --   BILITOT 0.8  --   --   --   --   PROT 5.0*  --   --   --   --   ALBUMIN 2.8*  2.7* 2.7* 2.6* 2.7* 2.6*   No results for input(s): LIPASE, AMYLASE in the last 168 hours. No results for input(s): AMMONIA in the last 168 hours. Coagulation Profile: No results for input(s): INR, PROTIME in the last 168 hours. Cardiac Enzymes: No results for input(s): CKTOTAL, CKMB, CKMBINDEX, TROPONINI in the last 168 hours. BNP (last 3 results) No results for input(s): PROBNP in the last 8760 hours. HbA1C: No results for input(s): HGBA1C in the last 72 hours. CBG: Recent Labs  Lab 03/21/20 1123 03/21/20 1623 03/21/20 2156 03/22/20 0629 03/22/20 1208  GLUCAP 190* 243* 225* 170* 91   Lipid Profile: No results for input(s): CHOL, HDL, LDLCALC, TRIG, CHOLHDL, LDLDIRECT in the last 72 hours. Thyroid Function Tests: No results for input(s): TSH, T4TOTAL, FREET4, T3FREE, THYROIDAB in the last 72 hours. Anemia Panel: No results for input(s): VITAMINB12, FOLATE, FERRITIN, TIBC, IRON, RETICCTPCT in  the last 72 hours. Sepsis Labs: No results for input(s): PROCALCITON, LATICACIDVEN in the last 168 hours.  Recent Results (from the past 240 hour(s))  Culture, blood (routine x 2)     Status: None   Collection Time: 03/12/20  7:17 PM   Specimen: BLOOD LEFT HAND  Result Value Ref Range Status   Specimen Description BLOOD LEFT HAND  Final   Special Requests   Final    BOTTLES DRAWN AEROBIC ONLY Blood Culture results may not be optimal due to an inadequate volume of blood received in culture bottles   Culture   Final    NO GROWTH 5 DAYS Performed at [facility], [place] [address], [place], [place] [address]    Report Status 03/17/2020 FINAL  Final  Culture, blood (Routine X 2) w Reflex to ID Panel     Status: None   Collection Time: 03/14/20  5:59 PM   Specimen: BLOOD LEFT HAND  Result Value Ref Range Status   Specimen Description BLOOD LEFT HAND  Final   Special Requests   Final    BOTTLES DRAWN AEROBIC AND ANAEROBIC Blood Culture adequate volume   Culture   Final    NO GROWTH 5 DAYS Performed at [facility], [place] [address], [place], [place] [address]    Report Status 03/19/2020 FINAL  Final  Culture, blood (Routine X 2) w Reflex to ID Panel     Status: None   Collection Time: 03/14/20 10:09 PM   Specimen: BLOOD  Result Value Ref Range Status   Specimen Description BLOOD LEFT HAND  Final   Special Requests   Final    BOTTLES DRAWN AEROBIC ONLY Blood Culture adequate volume   Culture   Final    NO GROWTH 5 DAYS Performed at [facility], [place] [address] [place], [place] [address]    Report Status 03/19/2020 FINAL  Final  Culture, blood (Routine X 2) w Reflex to ID Panel     Status: Abnormal   Collection Time: 03/17/20  3:54 PM   Specimen: BLOOD LEFT ARM  Result Value Ref Range Status   Specimen Description  BLOOD LEFT ARM  Final   Special Requests   Final    BOTTLES DRAWN AEROBIC ONLY Blood Culture adequate volume   Culture  Setup Time   Final    GRAM  NEGATIVE RODS AEROBIC BOTTLE ONLY CRITICAL RESULT CALLED TO, READ BACK BY AND VERIFIED WITH: A. Rogers Blocker PharmD 8:45 03/18/20 ([email or web address]) Performed at [facility], [place] [address], [place], [place] [address]    Culture (A)  Final    ESCHERICHIA COLI Confirmed Extended Spectrum Beta-Lactamase Producer (ESBL).  In bloodstream infections from ESBL organisms, carbapenems are preferred over piperacillin/tazobactam. They are shown to have a lower risk of mortality.    Report Status 03/20/2020 FINAL  Final   Organism ID, Bacteria ESCHERICHIA COLI  Final      Susceptibility   Escherichia coli - MIC*    AMPICILLIN >=32 RESISTANT Resistant     CEFAZOLIN >=64 RESISTANT Resistant     CEFEPIME >=32 RESISTANT Resistant     CEFTAZIDIME >=64 RESISTANT Resistant     CEFTRIAXONE >=64 RESISTANT Resistant     CIPROFLOXACIN >=4 RESISTANT Resistant     GENTAMICIN <=1 SENSITIVE Sensitive     IMIPENEM <=0.25 SENSITIVE Sensitive     TRIMETH/SULFA >=320 RESISTANT Resistant     AMPICILLIN/SULBACTAM >=32 RESISTANT Resistant     PIP/TAZO 64 INTERMEDIATE Intermediate     * ESCHERICHIA COLI  Culture, blood (Routine X 2) w Reflex to ID Panel     Status: Abnormal   Collection Time: 03/17/20  3:54 PM   Specimen: BLOOD LEFT ARM  Result Value Ref Range Status   Specimen Description BLOOD LEFT ARM  Final   Special Requests   Final    BOTTLES DRAWN AEROBIC ONLY Blood Culture adequate volume   Culture  Setup Time   Final    GRAM NEGATIVE RODS AEROBIC BOTTLE ONLY CRITICAL VALUE NOTED.  VALUE IS CONSISTENT WITH PREVIOUSLY REPORTED AND CALLED VALUE.    Culture (A)  Final    ESCHERICHIA COLI SUSCEPTIBILITIES PERFORMED ON PREVIOUS CULTURE WITHIN THE LAST 5 DAYS. Performed at [facility], [place] [address] [place], [place] [address]    Report Status 03/20/2020 FINAL  Final  Blood Culture ID Panel (Reflexed)     Status: Abnormal   Collection Time: 03/17/20  3:54 PM  Result Value Ref Range Status   Enterococcus  faecalis NOT DETECTED NOT DETECTED Final   Enterococcus Faecium NOT DETECTED NOT DETECTED Final   Listeria monocytogenes NOT DETECTED NOT DETECTED Final   Staphylococcus species NOT DETECTED NOT DETECTED Final   Staphylococcus aureus (BCID) NOT DETECTED NOT DETECTED Final   Staphylococcus epidermidis NOT DETECTED NOT DETECTED Final   Staphylococcus lugdunensis NOT DETECTED NOT DETECTED Final   Streptococcus species NOT DETECTED NOT DETECTED Final   Streptococcus agalactiae NOT DETECTED NOT DETECTED Final   Streptococcus pneumoniae NOT DETECTED NOT DETECTED Final   Streptococcus pyogenes NOT DETECTED NOT DETECTED Final   A.calcoaceticus-baumannii NOT DETECTED NOT DETECTED Final   Bacteroides fragilis NOT DETECTED NOT DETECTED Final   Enterobacterales DETECTED (A) NOT DETECTED Final    Comment: Enterobacterales represent a large order of gram negative bacteria, not a single organism. CRITICAL RESULT CALLED TO, READ BACK BY AND VERIFIED WITH: A. Rogers Blocker PharmD 8:45 03/18/20 ([email or web address])    Enterobacter cloacae complex NOT DETECTED NOT DETECTED Final   Escherichia coli DETECTED (A) NOT DETECTED Final    Comment: CRITICAL RESULT CALLED TO, READ BACK BY AND VERIFIED WITH: A. [facility] PharmD 8:45 03/18/20 ([email or web address])  Klebsiella aerogenes NOT DETECTED NOT DETECTED Final   Klebsiella oxytoca NOT DETECTED NOT DETECTED Final   Klebsiella pneumoniae NOT DETECTED NOT DETECTED Final   Proteus species NOT DETECTED NOT DETECTED Final   Salmonella species NOT DETECTED NOT DETECTED Final   Serratia marcescens NOT DETECTED NOT DETECTED Final   Haemophilus influenzae NOT DETECTED NOT DETECTED Final   Neisseria meningitidis NOT DETECTED NOT DETECTED Final   Pseudomonas aeruginosa NOT DETECTED NOT DETECTED Final   Stenotrophomonas maltophilia NOT DETECTED NOT DETECTED Final   Candida albicans NOT DETECTED NOT DETECTED Final   Candida auris NOT DETECTED NOT DETECTED Final   Candida glabrata NOT DETECTED NOT  DETECTED Final   Candida krusei NOT DETECTED NOT DETECTED Final   Candida parapsilosis NOT DETECTED NOT DETECTED Final   Candida tropicalis NOT DETECTED NOT DETECTED Final   Cryptococcus neoformans/gattii NOT DETECTED NOT DETECTED Final   CTX-M ESBL DETECTED (A) NOT DETECTED Final    Comment: CRITICAL RESULT CALLED TO, READ BACK BY AND VERIFIED WITH: A. Rogers Blocker PharmD 8:45 03/18/20 ([email or web address]) (NOTE) Extended spectrum beta-lactamase detected. Recommend a carbapenem as initial therapy.      Carbapenem resistance IMP NOT DETECTED NOT DETECTED Final   Carbapenem resistance KPC NOT DETECTED NOT DETECTED Final   Carbapenem resistance NDM NOT DETECTED NOT DETECTED Final   Carbapenem resist OXA 48 LIKE NOT DETECTED NOT DETECTED Final   Carbapenem resistance VIM NOT DETECTED NOT DETECTED Final    Comment: Performed at [facility], [place] [address] [place], [place] [address]  Culture, blood (routine x 2)     Status: None (Preliminary result)   Collection Time: 03/19/20  4:02 PM   Specimen: BLOOD LEFT ARM  Result Value Ref Range Status   Specimen Description BLOOD LEFT ARM  Final   Special Requests   Final    BOTTLES DRAWN AEROBIC ONLY Blood Culture adequate volume   Culture   Final    NO GROWTH 3 DAYS Performed at [facility], [address] [place], [place] [address]    Report Status PENDING  Incomplete  Culture, blood (routine x 2)     Status: None (Preliminary result)   Collection Time: 03/19/20  4:02 PM   Specimen: BLOOD LEFT ARM  Result Value Ref Range Status   Specimen Description BLOOD LEFT ARM  Final   Special Requests   Final    BOTTLES DRAWN AEROBIC ONLY Blood Culture adequate volume   Culture   Final    NO GROWTH 3 DAYS Performed at [facility], [place] [address] [place], [place] [address]    Report Status PENDING  Incomplete      Radiology Studies: DG Abd 1 View  Result Date: 03/20/2020 CLINICAL DATA:  Nausea and vomiting EXAM: ABDOMEN - 1 VIEW  COMPARISON:  03/18/2020 FINDINGS: Scattered large and small bowel gas is noted. Contrast material is noted throughout the colon from recent CT. No obstructive changes are noted. No free air is noted. IMPRESSION: No obstructive abnormality is noted. Electronically Signed   By: Inez Catalina M.D.   On: 03/20/2020 16:02     Scheduled Meds: . amiodarone  200 mg Oral BID  . ARIPiprazole  2 mg Oral Daily  . aspirin EC  81 mg Oral Q breakfast  . buPROPion  150 mg Oral Daily  . calcitRIOL  0.25 mcg Oral Q M,W,F  . calcium acetate  2,001 mg Oral TID WC  . Chlorhexidine Gluconate Cloth  6 each Topical  Daily  . diltiazem  360 mg Oral Daily  . feeding supplement (ENSURE ENLIVE)  237 mL Oral BID BM  . insulin aspart  0-5 Units Subcutaneous QHS  . insulin aspart  0-6 Units Subcutaneous TID WC  . insulin aspart  4 Units Subcutaneous TID WC  . insulin detemir  18 Units Subcutaneous BID  . levETIRAcetam  250 mg Oral BID  . levothyroxine  50 mcg Oral QAC breakfast  . mouth rinse  15 mL Mouth Rinse BID  . metoprolol tartrate  100 mg Oral BID  . midodrine  5 mg Oral TID WC  . montelukast  10 mg Oral QHS  . multivitamin  1 tablet Oral QHS  . pantoprazole  40 mg Oral Daily  . predniSONE  10 mg Oral Q breakfast  . primidone  50 mg Oral Daily  . sodium chloride flush  10-40 mL Intracatheter Q12H  . traZODone  50 mg Oral QHS   Continuous Infusions: . sodium chloride 250 mL (03/17/20 1309)  . imipenem-cilastatin 500 mg (03/22/20 1237)     LOS: 12 days    Time spent: 25 minutes spent in the coordination of care today.    Jonnie Finner, DO [facility]  If 7PM-7AM, please contact night-coverage [email or web address] 03/22/2020, 2:32 PM

## 2020-03-22 NOTE — Progress Notes (Signed)
ANTICOAGULATION CONSULT NOTE - Initial Consult  Pharmacy Consult for Heparin Indication: atrial fibrillation  Allergies  Allergen Reactions  . Benztropine Mesylate Other (See Comments)    Alopecia and white spots on eyes  . Codeine Swelling, Palpitations and Other (See Comments)    Mouth Swelling and Tachycardia   . Diazepam Other (See Comments)    COMA  . Diphenhydramine Hcl Anxiety, Palpitations and Other (See Comments)    Tachycardia and anxiousness  . Sulfonamide Derivatives Rash    Blisters, also  . Thioridazine Hcl Swelling  . Bee Venom     Documented on MAR  . Lisinopril Cough    Patient Measurements: Height: 5\' 2"  (157.5 cm) Weight: 89.5 kg (197 lb 5 oz) IBW/kg (Calculated) : 50.1 Heparin Dosing Weight: 70.6  Vital Signs: Temp: 98.4 F (36.9 C) (08/07 1230) Temp Source: Oral (08/07 1230) BP: 109/66 (08/07 1230) Pulse Rate: 93 (08/07 1230)  Labs: Recent Labs    03/20/20 0837 03/20/20 0837 03/21/20 1822 03/22/20 0725  HGB 9.8*   < > 10.1* 9.0*  HCT 30.2*  --  31.5* 28.7*  PLT 132*  --  136* 141*  CREATININE 2.92*  --  4.07* 4.36*   < > = values in this interval not displayed.    Estimated Creatinine Clearance: 12.8 mL/min (A) (by C-G formula based on SCr of 4.36 mg/dL (H)).   Medical History: Past Medical History:  Diagnosis Date  . Anemia   . Angina pectoris ([place])   . Anxiety   . Asthma   . Atrial flutter ([place])   . Back pain   . Barrett esophagus   . Bifascicular block   . BMI 39.0-39.9,adult   . Cellulitis   . CHF (congestive heart failure) ([place])   . Chronic diastolic CHF (congestive heart failure) ([place])   . Constipation   . Contact with and (suspected) exposure to covid-19   . COPD (chronic obstructive pulmonary disease) ([place])   . Dependence on renal dialysis ([place])   . Depression   . Dysphagia   . Dysuria   . Epilepsy ([place])   . ESRD on hemodialysis ([place])   . Essential hypertension   . Fibromyalgia   . Fibromyalgia   . GERD  (gastroesophageal reflux disease)   . Gout   . History of pericarditis   . Hypercholesterolemia   . Hyperlipidemia   . Hypothyroid   . Hypothyroidism   . Insomnia   . Long term (current) use of insulin ([place])   . Major depressive disorder   . Memory loss   . Moderate protein-calorie malnutrition ([place])   . Morbid obesity ([place])   . Nephrolithiasis    2008  . Obesity hypoventilation syndrome (H[facility])    CPAP  . Obstructive sleep apnea   . Pain in left arm   . PE (pulmonary embolism)    2010  . Peripheral neuropathy   . Persistent atrial fibrillation ([place])   . Pneumonia   . Polyp of nasal sinus   . PTSD (post-traumatic stress disorder)   . Pulmonary hypertension ([place])   . Rheumatoid arteritis ([place])   . Rheumatoid arthritis ([place])   . Schizophrenia ([place])   . 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 Seizures ([place])   . 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 Tachycardia, unspecified   . Type 2 diabetes mellitus ([place])   . Unspecified intellectual disabilities   . 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 Urinary tract infection   .  Vitamin D deficiency      Assessment: 65 YOF admitted for sepsis. Was on apixaban for afib PTA, last dose 7/25. Bridged with heparin, found bleeding subclavian line on admission.  Bleeding now resolved. Hgb, Hct trending up. Plts are up to 141. Requires anticoagulation for afib.   Goal of Therapy:  Heparin level 0.3-0.5 units/ml Monitor platelets by anticoagulation protocol: Yes   Plan:  No bolus due to bleeding risk Start heparin infusion at 800 units/hr Check anti-Xa level in 8 hours and daily while on heparin Continue to monitor H&H and platelets  Norina Buzzard, PharmD PGY1 Pharmacy Resident 03/22/2020 2:44 PM

## 2020-03-22 NOTE — Progress Notes (Signed)
[name] Progress Note   Subjective: Seen on HD using AVG with 15g needles. Cannulated without incident but has RU extremity edema. Oriented to person & Place only.     Objective Vitals:   03/21/20 1624 03/21/20 2212 03/22/20 0343 03/22/20 0500  BP: (!) 146/76 (!) 146/99 136/73   Pulse:  (!) 107 85   Resp:  18 18   Temp: 98.2 F (36.8 C) 98.3 F (36.8 C) 97.9 F (36.6 C)   TempSrc: Oral Oral Oral   SpO2:  95% 97%   Weight:    89.6 kg  Height:       Physical Exam General: obese older female in NAD Heart: Irregularly irregular. S1,S2 Afib with BBB on monitor. Rate 80-100s Lungs: Decreased in bases otherwise CTAB Abdomen: obese active BS Extremities: RUE 1+ pitting edema distal to AVG. Heel protector boots in place, very trace BLE edema Dialysis Access: RUA AVG cannulated    Additional Objective Labs: Basic Metabolic Panel: Recent Labs  Lab 03/19/20 0544 03/20/20 0837 03/21/20 1822  NA 136 135 132*  K 4.3 4.0 5.1  CL 95* 95* 94*  CO2 29 29 25   GLUCOSE 65* 139* 254*  BUN 46* 28* 44*  CREATININE 4.06* 2.92* 4.07*  CALCIUM 9.6 9.2 10.0  PHOS 2.3* 2.6 3.8   Liver Function Tests: Recent Labs  Lab 03/18/20 0727 03/18/20 0727 03/19/20 0544 03/20/20 0837 03/21/20 1822  AST 38  --   --   --   --   ALT 487*  --   --   --   --   ALKPHOS 117  --   --   --   --   BILITOT 0.8  --   --   --   --   PROT 5.0*  --   --   --   --   ALBUMIN 2.8*  2.7*   < > 2.7* 2.6* 2.7*   < > = values in this interval not displayed.   No results for input(s): LIPASE, AMYLASE in the last 168 hours. CBC: Recent Labs  Lab 03/18/20 0727 03/18/20 0727 03/19/20 0544 03/19/20 0544 03/20/20 0837 03/21/20 1822 03/22/20 0725  WBC 12.9*   < > 15.3*   < > 13.7* 12.5* 10.5  NEUTROABS  --   --   --   --   --  11.5*  --   HGB 9.6*   < > 9.3*   < > 9.8* 10.1* 9.0*  HCT 30.6*   < > 29.9*   < > 30.2* 31.5* 28.7*  MCV 112.9*  --  112.4*  --  110.2* 110.9* 110.8*  PLT 95*    < > 123*   < > 132* 136* 141*   < > = values in this interval not displayed.   Blood Culture    Component Value Date/Time   SDES BLOOD LEFT ARM 03/19/2020 1602   SDES BLOOD LEFT ARM 03/19/2020 1602   SPECREQUEST  03/19/2020 1602    BOTTLES DRAWN AEROBIC ONLY Blood Culture adequate volume   SPECREQUEST  03/19/2020 1602    BOTTLES DRAWN AEROBIC ONLY Blood Culture adequate volume   CULT  03/19/2020 1602    NO GROWTH 2 DAYS Performed at [facility], [place] [address], [place], [place] [address]    CULT  03/19/2020 1602    NO GROWTH 2 DAYS Performed at [facility], [place] [address] [place], [place] [address]    REPTSTATUS PENDING 03/19/2020 [phone]  REPTSTATUS PENDING 03/19/2020 1602    Cardiac Enzymes: No results for input(s): CKTOTAL, CKMB, CKMBINDEX, TROPONINI in the last 168 hours. CBG: Recent Labs  Lab 03/21/20 0637 03/21/20 1123 03/21/20 1623 03/21/20 2156 03/22/20 0629  GLUCAP 70 190* 243* 225* 170*   Iron Studies: No results for input(s): IRON, TIBC, TRANSFERRIN, FERRITIN in the last 72 hours. @lablastinr3 @ Studies/Results: DG Abd 1 View  Result Date: 03/20/2020 CLINICAL DATA:  Nausea and vomiting EXAM: ABDOMEN - 1 VIEW COMPARISON:  03/18/2020 FINDINGS: Scattered large and small bowel gas is noted. Contrast material is noted throughout the colon from recent CT. No obstructive changes are noted. No free air is noted. IMPRESSION: No obstructive abnormality is noted. Electronically Signed   By: Inez Catalina M.D.   On: 03/20/2020 16:02   Medications: . sodium chloride 250 mL (03/17/20 1309)  . imipenem-cilastatin 500 mg (03/21/20 2207)   . amiodarone  200 mg Oral BID  . ARIPiprazole  2 mg Oral Daily  . aspirin EC  81 mg Oral Q breakfast  . buPROPion  150 mg Oral Daily  . calcitRIOL  0.25 mcg Oral Q M,W,F  . calcium acetate  2,001 mg Oral TID WC  . Chlorhexidine Gluconate Cloth  6 each Topical Daily  . diltiazem  360 mg Oral Daily  . feeding supplement  (ENSURE ENLIVE)  237 mL Oral BID BM  . insulin aspart  0-5 Units Subcutaneous QHS  . insulin aspart  0-6 Units Subcutaneous TID WC  . insulin aspart  4 Units Subcutaneous TID WC  . insulin detemir  18 Units Subcutaneous BID  . levETIRAcetam  250 mg Oral BID  . levothyroxine  50 mcg Oral QAC breakfast  . mouth rinse  15 mL Mouth Rinse BID  . metoprolol tartrate  100 mg Oral BID  . midodrine  5 mg Oral TID WC  . montelukast  10 mg Oral QHS  . multivitamin  1 tablet Oral QHS  . pantoprazole  40 mg Oral Daily  . predniSONE  10 mg Oral Q breakfast  . primidone  50 mg Oral Daily  . sodium chloride flush  10-40 mL Intracatheter Q12H  . traZODone  50 mg Oral QHS     [facility].4 hours HD Bath2/2.5, Dialyzerunknown, Heparindoes not appear to receive heparin with her treatment, only in the catheter. Accessusing TDC.  Assessment/Plan: 1. Enterococcal bacteremia with septic shock- resolvedbut will need TEE to r/o SBE. Seen by ID 2 weeks of carbapenem   Unfortunately pt not able to give informed consent and HCPOA has not returned any calls from cardiology. 2. ESRD- off schedule since transitioning from CRRT to IHD. Will get back on schedule later this week.HD on 03/19/20.Will plan for HD on today to get back on her outpatient schedule. 3. Vascular access- TDC removed 7/29, using RAVG. 4. A fib with RVR- on amio.Rate controlled at present.  5. Hypotension- improved with midodrine. Attempting UFG 3.5 6. AMS- waxes and wanes. Oriented X 2.  7. Acute Hypoxic respiratory failure- currently on [place] 8. ABLA- from [facility] central line. S/p transfusion. Eliquis held and FFP given. HGB 9.0 9. Thrombocytopenia- per primary. 10. Elevated ALT- improving. 11. N/V- new this morning. Workup per primary. 12. Disposition- pt has not had significant improvement and remains confused. Seen by Palliative Care 08/06. Now DNR. Still have not been able to  contact POA.   [name] H. Zanna Hawn NP-C 03/22/2020, 8:14 AM  [employer] [phone]

## 2020-03-22 NOTE — Progress Notes (Signed)
   Palliative Medicine Inpatient Follow Up Note  Reason for consult:  Goals of Care "End of Life"  HPI:  Per intake [name] --> Kathleen G Archeris an 69 y.o.femalepast medical history of chronic respiratory failure with hypoxia secondary to underlying COPD on 3 L of oxygen, A. fib on Eliquis end-stage renal disease on hemodialysis chronic diastolic heart failure moderate AS, who came into the hospital for hyperglycemia and tachycardia was found in A. fib with RVR, decompensated systolic heart failure and septic shock who presented to the ED with atrial fibrillation and RVR with a heart rate of 170 was started on a diltiazem drip on arrival to the ED heart rate improved however she developed progressive hypotension and was transitioned to amiodarone due to hypotension PCCM was consulted and started on pressors for septic shock due to right lower lobe pneumonia.  Palliative care was asked to get involved to discuss goals of care and broach the topic of hospice.   Today's Discussion (03/22/2020): Chart reviewed.  I met with [name] after dialysis. She was sitting up in her bed eating her lunch. She stated that she feels "okay" but is "getting full". She shares that she has a bit of nausea at this time. She asked if I was able to contact her niece which I shared that I was not at that time.  Patient to complete two weeks of carbapenem per ID.   Have made contact with patients niece. She is in agreement with DNR. She plans to come to bedside tomorrow.   Discussed the importance of continued conversation with family and their  medical providers regarding overall plan of care and treatment options, ensuring decisions are within the context of the patients values and GOCs.  Questions and concerns addressed   SUMMARY OF RECOMMENDATIONS   DNAR/DNI  MOST Completed / DNR Form Completed  Was able to get in touch with patients niece, teresa. You have to text message her first introducing yourself and why  you are contacting her. She is in agreement with being a decision maker if patient is incapacitated.  [name] has been called and updated with nieces number  Time Spent: 25 Greater than 50% of the time was spent in counseling and coordination of care ______________________________________________________________________________________ [place] Team Team Cell Phone: [phone] Please utilize secure chat with additional questions, if there is no response within 30 minutes please call the above phone number  Palliative Medicine Team providers are available by phone from 7am to 7pm daily and can be reached through the team cell phone.  Should this patient require assistance outside of these hours, please call the patient's attending physician.

## 2020-03-23 DIAGNOSIS — Z7189 Other specified counseling: Secondary | ICD-10-CM | POA: Diagnosis not present

## 2020-03-23 DIAGNOSIS — I5033 Acute on chronic diastolic (congestive) heart failure: Secondary | ICD-10-CM | POA: Diagnosis not present

## 2020-03-23 DIAGNOSIS — I4891 Unspecified atrial fibrillation: Secondary | ICD-10-CM | POA: Diagnosis not present

## 2020-03-23 DIAGNOSIS — Z515 Encounter for palliative care: Secondary | ICD-10-CM | POA: Diagnosis not present

## 2020-03-23 DIAGNOSIS — N186 End stage renal disease: Secondary | ICD-10-CM | POA: Diagnosis not present

## 2020-03-23 DIAGNOSIS — J9621 Acute and chronic respiratory failure with hypoxia: Secondary | ICD-10-CM | POA: Diagnosis not present

## 2020-03-23 DIAGNOSIS — R7881 Bacteremia: Secondary | ICD-10-CM | POA: Diagnosis not present

## 2020-03-23 DIAGNOSIS — A4181 Sepsis due to Enterococcus: Secondary | ICD-10-CM | POA: Diagnosis not present

## 2020-03-23 DIAGNOSIS — R0602 Shortness of breath: Secondary | ICD-10-CM | POA: Diagnosis not present

## 2020-03-23 DIAGNOSIS — Z66 Do not resuscitate: Secondary | ICD-10-CM | POA: Diagnosis not present

## 2020-03-23 LAB — CBC WITH DIFFERENTIAL/PLATELET
Abs Immature Granulocytes: 0.05 10*3/uL (ref 0.00–0.07)
Basophils Absolute: 0.1 10*3/uL (ref 0.0–0.1)
Basophils Relative: 1 %
Eosinophils Absolute: 0.1 10*3/uL (ref 0.0–0.5)
Eosinophils Relative: 1 %
HCT: 30.5 % — ABNORMAL LOW (ref 36.0–46.0)
Hemoglobin: 9.6 g/dL — ABNORMAL LOW (ref 12.0–15.0)
Immature Granulocytes: 1 %
Lymphocytes Relative: 17 %
Lymphs Abs: 1.7 10*3/uL (ref 0.7–4.0)
MCH: 35.8 pg — ABNORMAL HIGH (ref 26.0–34.0)
MCHC: 31.5 g/dL (ref 30.0–36.0)
MCV: 113.8 fL — ABNORMAL HIGH (ref 80.0–100.0)
Monocytes Absolute: 1.1 10*3/uL — ABNORMAL HIGH (ref 0.1–1.0)
Monocytes Relative: 11 %
Neutro Abs: 6.7 10*3/uL (ref 1.7–7.7)
Neutrophils Relative %: 69 %
Platelets: 152 10*3/uL (ref 150–400)
RBC: 2.68 MIL/uL — ABNORMAL LOW (ref 3.87–5.11)
RDW: 15.3 % (ref 11.5–15.5)
WBC: 9.7 10*3/uL (ref 4.0–10.5)
nRBC: 0 % (ref 0.0–0.2)

## 2020-03-23 LAB — RENAL FUNCTION PANEL
Albumin: 2.6 g/dL — ABNORMAL LOW (ref 3.5–5.0)
Anion gap: 14 (ref 5–15)
BUN: 26 mg/dL — ABNORMAL HIGH (ref 8–23)
CO2: 24 mmol/L (ref 22–32)
Calcium: 9.2 mg/dL (ref 8.9–10.3)
Chloride: 97 mmol/L — ABNORMAL LOW (ref 98–111)
Creatinine, Ser: 3.18 mg/dL — ABNORMAL HIGH (ref 0.44–1.00)
GFR calc Af Amer: 17 mL/min — ABNORMAL LOW (ref >60)
GFR calc non Af Amer: 14 mL/min — ABNORMAL LOW (ref >60)
Glucose, Bld: 84 mg/dL (ref 70–99)
Phosphorus: 2.6 mg/dL (ref 2.5–4.6)
Potassium: 4.5 mmol/L (ref 3.5–5.1)
Sodium: 135 mmol/L (ref 135–145)

## 2020-03-23 LAB — GLUCOSE, CAPILLARY
Glucose-Capillary: 91 mg/dL (ref 70–99)
Glucose-Capillary: 92 mg/dL (ref 70–99)
Glucose-Capillary: 87 mg/dL (ref 70–99)
Glucose-Capillary: 166 mg/dL — ABNORMAL HIGH (ref 70–99)
Glucose-Capillary: 235 mg/dL — ABNORMAL HIGH (ref 70–99)

## 2020-03-23 LAB — MAGNESIUM: Magnesium: 2.3 mg/dL (ref 1.7–2.4)

## 2020-03-23 MED ORDER — APIXABAN 5 MG PO TABS
5.0000 mg | ORAL_TABLET | Freq: Two times a day (BID) | ORAL | Status: DC
  Administered 2020-03-23 – 2020-03-25 (×5): 5 mg via ORAL
  Filled 2020-03-23 (×5): qty 1

## 2020-03-23 NOTE — Progress Notes (Signed)
New Palliative referral:   Hydrologist [facility])  Hospital Liaison: RN note         Notified by [facility] manager of patient/family request for [facility] Palliative services at home after discharge.         Writer called niece listed as contact but was unable to reach to confirm interest or to explain services.               [place] [facility] team will follow up with patient after discharge.         Please call with any hospice or palliative related questions.         Thank you for this referral.         Domenic Moras, BSN, RN [place] (listed on [place] under Hospice[facility])    [phone]

## 2020-03-23 NOTE — Progress Notes (Signed)
[place] for Heparin Indication: atrial fibrillation  Assessment: 41 YOF admitted for sepsis. Was on apixaban for afib PTA, last dose 7/25. Bridged with heparin, found bleeding subclavian line on admission.  Bleeding now resolved. Hgb, Hct trending up. Plts are up to 141. Requires anticoagulation for afib.  Heparin level 0.28 units/ml  Goal of Therapy:  Heparin level 0.3-0.5 units/ml Monitor platelets by anticoagulation protocol: Yes   Plan:  Continue heparin at 700 units/hr Check HL with am labs  Thanks for allowing pharmacy to be a part of this patient's care.  Excell Seltzer, PharmD Clinical Pharmacist 03/23/2020 12:08 AM

## 2020-03-23 NOTE — Progress Notes (Signed)
   Palliative Medicine Inpatient Follow Up Note  Reason for consult:  Goals of Care "End of Life"  HPI:  Per intake [name] --> Kathleen G Archeris an 69 y.o.femalepast medical history of chronic respiratory failure with hypoxia secondary to underlying COPD on 3 L of oxygen, A. fib on Eliquis end-stage renal disease on hemodialysis chronic diastolic heart failure moderate AS, who came into the hospital for hyperglycemia and tachycardia was found in A. fib with RVR, decompensated systolic heart failure and septic shock who presented to the ED with atrial fibrillation and RVR with a heart rate of 170 was started on a diltiazem drip on arrival to the ED heart rate improved however she developed progressive hypotension and was transitioned to amiodarone due to hypotension PCCM was consulted and started on pressors for septic shock due to right lower lobe pneumonia.  Palliative care was asked to get involved to discuss goals of care and broach the topic of hospice.   Today's Discussion (03/23/2020): Chart reviewed.  I met with [name] and her niece [name] this morning. They had not seen one another in seven years. [name] shares that she is thrilled to see her niece.   While visiting Cardiology came in and spoke to them about continuing the current course of treatment and not pursuing a TEE at this juncture given the risks associate with it which the patient and niece were in agreement with.   Otherwise plan will be for patient to return to [place] with OP Palliative Care.  Discussed the importance of continued conversation with family and their  medical providers regarding overall plan of care and treatment options, ensuring decisions are within the context of the patients values and GOCs.  Questions and concerns addressed   SUMMARY OF RECOMMENDATIONS   DNAR/DNI  MOST Completed / DNR Form Completed  TOC - OP Palliative Care  Spiritual Consult - HCPOA Documentation  Time Spent:  25 Greater than 50% of the time was spent in counseling and coordination of care ______________________________________________________________________________________ [place] Team Team Cell Phone: [phone] Please utilize secure chat with additional questions, if there is no response within 30 minutes please call the above phone number  Palliative Medicine Team providers are available by phone from 7am to 7pm daily and can be reached through the team cell phone.  Should this patient require assistance outside of these hours, please call the patient's attending physician.

## 2020-03-23 NOTE — TOC Progression Note (Signed)
Transition of Care [facility]) - Progression Note    Patient Details  Name: [name] MRN: [id number] Date of Birth: 1950/12/03  Transition of Care [facility]) CM/SW [place], LCSW Phone Number: [phone] 03/23/2020, 10:12 AM  Clinical Narrative:     CSW was alerted that niece was able to be reach and she is able to make decisions in regards to patient.  TOC team will continue to assist with discharge planning needs.  Expected Discharge Plan: Skilled Nursing Facility Barriers to Discharge: Continued Medical Work up  Expected Discharge Plan and Services Expected Discharge Plan: [place]       Living arrangements for the past 2 months: [place]                                       Social Determinants of Health (SDOH) Interventions    Readmission Risk Interventions Readmission Risk Prevention Plan 03/11/2020 09/10/2019 11/29/2018  Transportation Screening Complete Complete Complete  Medication Review [employer]) - Complete Complete  PCP or Specialist appointment within 3-5 days of discharge - - Not Complete  PCP/Specialist Appt Not Complete comments - - earliest appt available was 12/08/18  [place] or Home Care Consult - - Complete  SW Recovery Care/Counseling Consult Complete - Complete  Palliative Care Screening - - Not Applicable  Skilled Nursing Facility Complete - Not Applicable  Some recent data might be hidden

## 2020-03-23 NOTE — Progress Notes (Addendum)
Progress Note  Patient Name: [name] Date of Encounter: 03/23/2020  [facility] Cardiologist: Kathleen Lesches, MD    Subjective   69 year old female with a history of schizophrenia, memory loss, long-term care.  She has never lived independently. She has a history of persistent atrial fibrillation.  The decision has been made previously to have a strategy of rate control and anticoagulation without plans for rhythm control.  She is had a complicated several months including Pneumonia and respiratory failure.  She has had renal failure requiring dialysis.  She was recently admitted with septic shock / sepsis/bacteremia.     She has been on Eliquis in the past.  Eliquis was held because of some petechial lesions on her hands.     Inpatient Medications    Scheduled Meds: . amiodarone  200 mg Oral BID  . ARIPiprazole  2 mg Oral Daily  . aspirin EC  81 mg Oral Q breakfast  . buPROPion  150 mg Oral Daily  . calcitRIOL  0.25 mcg Oral Q M,W,F  . calcium acetate  2,001 mg Oral TID WC  . Chlorhexidine Gluconate Cloth  6 each Topical Daily  . diltiazem  360 mg Oral Daily  . feeding supplement (ENSURE ENLIVE)  237 mL Oral BID BM  . insulin aspart  0-5 Units Subcutaneous QHS  . insulin aspart  0-6 Units Subcutaneous TID WC  . insulin aspart  4 Units Subcutaneous TID WC  . insulin detemir  18 Units Subcutaneous BID  . levETIRAcetam  250 mg Oral BID  . levothyroxine  50 mcg Oral QAC breakfast  . mouth rinse  15 mL Mouth Rinse BID  . metoprolol tartrate  100 mg Oral BID  . midodrine  5 mg Oral TID WC  . montelukast  10 mg Oral QHS  . multivitamin  1 tablet Oral QHS  . pantoprazole  40 mg Oral Daily  . predniSONE  10 mg Oral Q breakfast  . primidone  50 mg Oral Daily  . sodium chloride flush  10-40 mL Intracatheter Q12H  . traZODone  50 mg Oral QHS   Continuous Infusions: . sodium chloride 250 mL (03/17/20 1309)  . heparin 800 Units/hr (03/22/20 1624)  .  imipenem-cilastatin 500 mg (03/23/20 1027)   PRN Meds: acetaminophen, albuterol, calcium carbonate, diphenhydrAMINE, docusate sodium, EPINEPHrine, heparin, HYDROcodone-acetaminophen, metoprolol tartrate, nitroGLYCERIN, ondansetron (ZOFRAN) IV, Resource ThickenUp Clear, sodium chloride flush   Vital Signs    Vitals:   03/22/20 2130 03/23/20 0000 03/23/20 0300 03/23/20 0500  BP: 125/62 126/79 127/77 121/64  Pulse:  85 (!) 128 88  Resp: 15 (!) 21 19 16   Temp: 98.1 F (36.7 C)  (!) 97.4 F (36.3 C)   TempSrc: Oral  Oral   SpO2:  100% 100% 100%  Weight:   86.4 kg   Height:        Intake/Output Summary (Last 24 hours) at 03/23/2020 1028 Last data filed at 03/23/2020 0400 Gross per 24 hour  Intake 1407.44 ml  Output 3500 ml  Net -2092.56 ml   Last 3 Weights 03/23/2020 03/22/2020 03/22/2020  Weight (lbs) 190 lb 7.6 oz 197 lb 5 oz 197 lb 8.5 oz  Weight (kg) 86.4 kg 89.5 kg 89.6 kg      Telemetry    Atrial fibrillation with controlled ventricular response- Personally Reviewed  ECG     - Personally Reviewed  Physical Exam   GEN:  Middle-aged female, no acute distress.  Her niece was by her the  bedside. Neck: No JVD Cardiac: RRR, no murmurs, rubs, or gallops.  Respiratory: Clear to auscultation bilaterally. GI: Soft, nontender, non-distended  MS: No edema; No deformity. Neuro:  Nonfocal  Psych: Normal affect   Labs    High Sensitivity Troponin:   Recent Labs  Lab 03/10/20 1032 03/10/20 1323  TROPONINIHS 42* 56*      Chemistry Recent Labs  Lab 03/18/20 0727 03/19/20 0544 03/20/20 0837 03/21/20 1822 03/22/20 0725  NA 138   < > 135 132* 129*  K 4.1   < > 4.0 5.1 4.4  CL 98   < > 95* 94* 93*  CO2 29   < > 29 25 24   GLUCOSE 69*   < > 139* 254* 170*  BUN 35*   < > 28* 44* 49*  CREATININE 3.28*   < > 2.92* 4.07* 4.36*  CALCIUM 9.2   < > 9.2 10.0 9.8  PROT 5.0*  --   --   --   --   ALBUMIN 2.8*  2.7*   < > 2.6* 2.7* 2.6*  AST 38  --   --   --   --   ALT 487*  --    --   --   --   ALKPHOS 117  --   --   --   --   BILITOT 0.8  --   --   --   --   GFRNONAA 14*   < > 16* 11* 10*  GFRAA 16*   < > 18* 12* 11*  ANIONGAP 11   < > 11 13 12    < > = values in this interval not displayed.     Hematology Recent Labs  Lab 03/21/20 1822 03/22/20 0725 03/23/20 0908  WBC 12.5* 10.5 9.7  RBC 2.84* 2.59* 2.68*  HGB 10.1* 9.0* 9.6*  HCT 31.5* 28.7* 30.5*  MCV 110.9* 110.8* 113.8*  MCH 35.6* 34.7* 35.8*  MCHC 32.1 31.4 31.5  RDW 15.3 15.3 15.3  PLT 136* 141* 152    BNPNo results for input(s): BNP, PROBNP in the last 168 hours.   DDimer No results for input(s): DDIMER in the last 168 hours.   Radiology    No results found.  Cardiac Studies     Patient Profile     69 y.o. female with a long history of schizophrenia and perhaps some mental disability.  She has persistent atrial fibrillation and has been on Eliquis.  She has end-stage renal disease, moderate aortic stenosis.  She is admitted with septic shock and bacteremia.  Assessment & Plan    1.  Atrial fibrillation: The patient remains in atrial fibrillation.  She has longstanding atrial fibrillation with markedly dilated left atrium and her chances of being successfully cardioverted are low.  We will continue with rate control and anticoagulation as long she is not having bleeding issues.  I would not suggest attempting cardioversion and would certainly not suggest a TEE guided cardioversion as this would place her at more risk for respiratory issues and would not increase the success of the procedure.  At this point I would continue with rate control and supportive care.  She is currently being seen by palliative care.  2.  Bacteremia:   There is conversation about doing a TEE to evaluate her bacteremia.  [name] is a very poor candidate for TEE.   She has had pneumonia / respiratory failure over the past several months. Even if we were to find bacterial endocarditis, she  is a very poor  candidate  ( likely not a candidate ) for valve surgery .   She is bedbound, non ambulatory,  She has required  care for her entire life due to her cognitive impairment   I think she needs empiric antibiotics for this bacteremia.       [facility] will sign off.   Medication Recommendations:   Other recommendations (labs, testing, etc):   Follow up as an outpatient:  With Dr. Domenic Polite in [place]  For questions or updates, please contact [place] Please consult [email or web address] for contact info under        Signed, Mertie Moores, MD  03/23/2020, 10:28 AM

## 2020-03-23 NOTE — Progress Notes (Signed)
[facility] Progress Note   Subjective: Oriented X 2. Very pleasant, thanks staff "for caring about me". Challenged volume yesterday, tolerated well. No LE edema today, denies SOB.   Objective Vitals:   03/22/20 2130 03/23/20 0000 03/23/20 0300 03/23/20 0500  BP: 125/62 126/79 127/77 121/64  Pulse:  85 (!) 128 88  Resp: 15 (!) 21 19 16   Temp: 98.1 F (36.7 C)  (!) 97.4 F (36.3 C)   TempSrc: Oral  Oral   SpO2:  100% 100% 100%  Weight:   86.4 kg   Height:       Physical Exam General: obese older female in NAD Heart: Irregularly irregular. S1,S2 Afib with BBB on monitor. Rate 80-100s Lungs: Decreased in bases otherwise CTAB Abdomen: obese active BS Extremities: RUE trace pitting edema distal to AVG. Heel protector boots in place,No BLE edema Dialysis Access: RUA AVG + bruit    Additional Objective Labs: Basic Metabolic Panel: Recent Labs  Lab 03/20/20 0837 03/21/20 1822 03/22/20 0725  NA 135 132* 129*  K 4.0 5.1 4.4  CL 95* 94* 93*  CO2 29 25 24   GLUCOSE 139* 254* 170*  BUN 28* 44* 49*  CREATININE 2.92* 4.07* 4.36*  CALCIUM 9.2 10.0 9.8  PHOS 2.6 3.8 4.0   Liver Function Tests: Recent Labs  Lab 03/18/20 0727 03/19/20 0544 03/20/20 0837 03/21/20 1822 03/22/20 0725  AST 38  --   --   --   --   ALT 487*  --   --   --   --   ALKPHOS 117  --   --   --   --   BILITOT 0.8  --   --   --   --   PROT 5.0*  --   --   --   --   ALBUMIN 2.8*   2.7*   < > 2.6* 2.7* 2.6*   < > = values in this interval not displayed.   No results for input(s): LIPASE, AMYLASE in the last 168 hours. CBC: Recent Labs  Lab 03/18/20 0727 03/18/20 0727 03/19/20 0544 03/19/20 0544 03/20/20 0837 03/21/20 1822 03/22/20 0725  WBC 12.9*   < > 15.3*   < > 13.7* 12.5* 10.5  NEUTROABS  --   --   --   --   --  11.5*  --   HGB 9.6*   < > 9.3*   < > 9.8* 10.1* 9.0*  HCT 30.6*   < > 29.9*   < > 30.2* 31.5* 28.7*  MCV 112.9*  --  112.4*  --  110.2* 110.9* 110.8*  PLT 95*    < > 123*   < > 132* 136* 141*   < > = values in this interval not displayed.   Blood Culture    Component Value Date/Time   SDES BLOOD LEFT ARM 03/19/2020 1602   SDES BLOOD LEFT ARM 03/19/2020 1602   SPECREQUEST  03/19/2020 1602    BOTTLES DRAWN AEROBIC ONLY Blood Culture adequate volume   SPECREQUEST  03/19/2020 1602    BOTTLES DRAWN AEROBIC ONLY Blood Culture adequate volume   CULT  03/19/2020 1602    NO GROWTH 3 DAYS Performed at [facility], [place] [address], [place], [place] [address]    CULT  03/19/2020 1602    NO GROWTH 3 DAYS Performed at [place] [address] [place], [place] [address]    REPTSTATUS PENDING 03/19/2020 1602   REPTSTATUS PENDING 03/19/2020 [phone]  Cardiac Enzymes: No results for input(s): CKTOTAL, CKMB, CKMBINDEX, TROPONINI in the last 168 hours. CBG: Recent Labs  Lab 03/22/20 1208 03/22/20 1644 03/22/20 2127 03/23/20 0543 03/23/20 0630  GLUCAP 91 173* 212* 91 92   Iron Studies: No results for input(s): IRON, TIBC, TRANSFERRIN, FERRITIN in the last 72 hours. @lablastinr3 @ Studies/Results: No results found. Medications:  sodium chloride 250 mL (03/17/20 1309)   heparin 800 Units/hr (03/22/20 1624)   imipenem-cilastatin 500 mg (03/22/20 2221)    amiodarone  200 mg Oral BID   ARIPiprazole  2 mg Oral Daily   aspirin EC  81 mg Oral Q breakfast   buPROPion  150 mg Oral Daily   calcitRIOL  0.25 mcg Oral Q M,W,F   calcium acetate  2,001 mg Oral TID WC   Chlorhexidine Gluconate Cloth  6 each Topical Daily   diltiazem  360 mg Oral Daily   feeding supplement (ENSURE ENLIVE)  237 mL Oral BID BM   insulin aspart  0-5 Units Subcutaneous QHS   insulin aspart  0-6 Units Subcutaneous TID WC   insulin aspart  4 Units Subcutaneous TID WC   insulin detemir  18 Units Subcutaneous BID   levETIRAcetam  250 mg Oral BID   levothyroxine  50 mcg Oral QAC breakfast   mouth rinse  15 mL Mouth Rinse BID   metoprolol  tartrate  100 mg Oral BID   midodrine  5 mg Oral TID WC   montelukast  10 mg Oral QHS   multivitamin  1 tablet Oral QHS   pantoprazole  40 mg Oral Daily   predniSONE  10 mg Oral Q breakfast   primidone  50 mg Oral Daily   sodium chloride flush  10-40 mL Intracatheter Q12H   traZODone  50 mg Oral QHS     [facility].4 [facility] HD Bath2/2.5, Dialyzerunknown, Heparindoes not appear to receive heparin with her treatment, only in the catheter. Accessusing TDC.  Assessment/Plan: 1. Enterococcal bacteremia with septic shock- resolvedbut will need TEE to r/o SBE. Seen by ID, recommending 2 weeks of carbapenem   Unfortunately pt not able to give informed consent and HCPOA has not returned any calls from cardiology. 2. ESRD- Initially on CRRT now transitioned back to  IHD. Next HD 03/25/2020.  3. Vascular access- TDC removed 7/29, using RAVG. Has edema distal to AVG but no issues cannulating AVG. Monitor.  4. A fib with RVR- on amio.Rate controlled at present. On heparin gtt.  5. Hypotension- improved with midodrine. HD 03/22/2020 Net UF 3.5 liters. Weight this AM 86.4 kg. Closer to OP EDW. Looks better volume wise. Continue lowering volume as tolerated.  6. AMS- waxes and wanes. Oriented X 2.  7. Acute Hypoxic respiratory failure- currently on [place] 8. ABLA- from [facility] central line. S/p transfusion. Eliquis held and FFP given. HGB 9.0 03/22/2020 9. Thrombocytopenia- per primary. 10. Elevated ALT- improving. 11. N/V-Workup per primary. No C/Os this AM.  12. SHPT: Ca 9.8 C Ca 10.9 PO4 at goal. Stop VDRA. May need to switch to none calcium based binders. Follow labs.  Disposition- pt has not had significant improvement and remains pleasantly confused. Consider that this is her baseline. Seen by Palliative Care 08/06. Now DNR. Palliative care able to contact niece. Plans to come to hospital today.   Elijiah Mickley H. Mckinsey Keagle NP-C 03/23/2020, 9:03  AM  [employer] [phone]

## 2020-03-23 NOTE — Progress Notes (Signed)
Received referral to assist with outpt palliative. Met with pt and niece Helene Kelp). Helene Kelp doesn't have [place] POA. Helene Kelp reports that she is the only family member that can assist with pt's care. Pt has a nephew but he is not able to assist pt because his wife is critically ill. The D/C plan is for pt to return to [facility]. Niece reports that she is not able to take pt home because she lives in a trailer home and with her significant other, her children, and grand-children. Discussed outpt palliative and preference for an agency. They chose [facility]. Contacted Chrislyn with [employer] for referral.  [name]'s cell phone [phone].

## 2020-03-23 NOTE — Progress Notes (Signed)
PROGRESS NOTE    [name]  DGU:[id number] DOB: Jan 21, 1951 DOA: 03/10/2020 PCP: Glenda Chroman, MD   Brief Narrative:   [name] an 69 y.o.femalepast medical history of chronic respiratory failure with hypoxia secondary to underlying COPD on 3 L of oxygen, A. fib on Eliquis end-stage renal disease on hemodialysis chronic diastolic heart failure moderate AS, who came into the hospital for hyperglycemia and tachycardia was found in A. fib with RVR, decompensated systolic heart failure and septic shock who presented to the ED with atrial fibrillation and RVR with a heart rate of 170 was started on a diltiazem drip on arrival to the ED heart rate improved however she developed progressive hypotension and was transitioned to amiodarone due to hypotension PCCM was consulted and started on pressors for septic shock due to right lower lobe pneumonia.  8/8: Discussed TEE w/ cardiology this AM. RvB is heavily in risk corner. Will continue with current plan for 2 weeks of carbapenem (End date 8/16). Family filling out paperwork for HCPOA. Work on getting her back to SNF. Should be able to complete abx there.    Assessment & Plan:  Septic shock due to Enterococcus faecalis bacteremia probably due to her HD catheter E coli bacteremia - ID was consulted he was sensitive to Vanco, then switch to Zyvox, then ampicillin now on imipenem due to blood cultures growing ESBL E. coli. - TEE pending - Central line discontinued on 03/16/2020. - Tunnel catheter successfully removed on 03/13/2020. - Bld Cx on 03/10/2020 for Enterococcus. - Bld Cx cultures on 03/17/2020 show ESBL E. Coli     - Bld Cx 8/4 are NTD -8/8: primaxin through 8/16  Bleeding subclavian line Thrombocytopenia - s/p 1 unit of platelets and 2 units of fresh frozen plasma - Eliquis was held - plts are trending up, no new bleed noted -8/8: Thrombocytopenia is resolved and Hgb is stable.  Transition back to eliquis.  Persistent atrial fibrillation with RVR: - Probably driven by her infectious bacteremia. - Due to heart control heart rate EP was consulted recommended to continue amiodarone, metoprolol and diltiazem - see thrombocytopenia above  Acute on chronic respiratory failure with hypoxia acute decompensated diastolic heart failure Pleural effusion - on baseline O2 use - volume control w/ HD - w/ baseline O2 use and small effusion, no further w/u at this time  N/V - no stools documented - PRN anti-emetics - KUB negative - N/V improved, tolerating diet      - 8/8: resolved  Acute metabolic/toxic encephalopathy Acute on Chronic encephalopathy/schizophrenia - neurology eval'd; rec'd continuing primidone and Keppra - last ammonia was 50 - acute change is resolved; she is at her baseline per family; hx of schizophrenia  ESRD on HD - per nephrology  Transaminitis - Likely due to shock liver - RUQ US showed no obstructive jaundice. - Transaminases are improving.  Insulin-dependent diabetes mellitus type 2 - A1c: 8.0 - Had hypoglycemic episodes ON, decrease levemir to 18 units BID - continue SSI, DM diet  Elevated troponins - Likely due to demand ischemia, further management per cardiology.  Hypothyroidism: - Continue Synthroid.  Rheumatoid arthritis: - On chronic prednisone continue current regimen.  Gout - denies complaints.  - follow  Schizophrenia - on welbutrin, abilify  Hyperkalemia - resolved on HD  Stage II ulcer present on admission: - RN Pressure Injury Documentation: - Pressure Injury 03/10/20 Heel Left Unstageable - Full thickness tissue loss in which the base of the injury is covered by slough (  yellow, tan, gray, green or brown) and/or eschar (tan, brown or black) in the wound bed. 3 cm x 2 cm  (Active) 03/10/20 1730 Location: Heel Location Orientation: Left Staging: Unstageable - Full thickness tissue loss in which the base of the injury is covered by slough (yellow, tan, gray, green or brown) and/or eschar (tan, brown or black) in the wound bed. Wound Description (Comments): 3 cm x 2 cm Present on Admission: Yes  DVT prophylaxis: eliquis Code Status: DNR Family Communication: Spoke with niece at bedside   Status is: Inpatient  Remains inpatient appropriate because:Inpatient level of care appropriate due to severity of illness   Dispo: The patient is from: SNF              Anticipated d/c is to: SNF              Anticipated d/c date is: 2 days              Patient currently is not medically stable to d/c.  Consultants:   Cardiology  Nephrology  Palliative Care  Antimicrobials:  . Primaxin   ROS:  Denies N, V, ab pain, CP, dyspnea . Remainder ROS is negative for all not previously mentioned.  Subjective: "Can you put my oxygen back on?"  Objective: Vitals:   03/23/20 0300 03/23/20 0500 03/23/20 1300 03/23/20 1404  BP: 127/77 121/64  123/78  Pulse: (!) 128 88  96  Resp: 19 16  19   Temp: (!) 97.4 F (36.3 C)  98.3 F (36.8 C) 98.3 F (36.8 C)  TempSrc: Oral   Oral  SpO2: 100% 100%  98%  Weight: 86.4 kg     Height:        Intake/Output Summary (Last 24 hours) at 03/23/2020 1417 Last data filed at 03/23/2020 1300 Gross per 24 hour  Intake 1047.44 ml  Output 200 ml  Net 847.44 ml   Filed Weights   03/22/20 0500 03/22/20 0715 03/23/20 0300  Weight: 89.6 kg 89.5 kg 86.4 kg    Examination:  General: 69 y.o. female resting in bed in NAD Cardiovascular: RRR, +S1, S2, no m/g/r Respiratory: CTABL, no w/r/r, normal WOB GI: BS+, NDNT, no masses noted, no organomegaly noted MSK: No e/c/c Neuro: Alert to name, follows commands Psyc: Appropriate interaction and affect, calm/cooperative   Data Reviewed: I have personally reviewed following labs and  imaging studies.  CBC: Recent Labs  Lab 03/19/20 0544 03/20/20 0837 03/21/20 1822 03/22/20 0725 03/23/20 0908  WBC 15.3* 13.7* 12.5* 10.5 9.7  NEUTROABS  --   --  11.5*  --  6.7  HGB 9.3* 9.8* 10.1* 9.0* 9.6*  HCT 29.9* 30.2* 31.5* 28.7* 30.5*  MCV 112.4* 110.2* 110.9* 110.8* 113.8*  PLT 123* 132* 136* 141* 517   Basic Metabolic Panel: Recent Labs  Lab 03/19/20 0544 03/20/20 0837 03/21/20 1822 03/22/20 0725 03/23/20 0908  NA 136 135 132* 129* 135  K 4.3 4.0 5.1 4.4 4.5  CL 95* 95* 94* 93* 97*  CO2 29 29 25 24 24   GLUCOSE 65* 139* 254* 170* 84  BUN 46* 28* 44* 49* 26*  CREATININE 4.06* 2.92* 4.07* 4.36* 3.18*  CALCIUM 9.6 9.2 10.0 9.8 9.2  MG  --   --  2.3 2.6* 2.3  PHOS 2.3* 2.6 3.8 4.0 2.6   GFR: Estimated Creatinine Clearance: 17.3 mL/min (A) (by C-G formula based on SCr of 3.18 mg/dL (H)). Liver Function Tests: Recent Labs  Lab 03/18/20 0727 03/18/20 0727 03/19/20 0544 03/20/20  7517 03/21/20 1822 03/22/20 0725 03/23/20 0908  AST 38  --   --   --   --   --   --   ALT 487*  --   --   --   --   --   --   ALKPHOS 117  --   --   --   --   --   --   BILITOT 0.8  --   --   --   --   --   --   PROT 5.0*  --   --   --   --   --   --   ALBUMIN 2.8*  2.7*   < > 2.7* 2.6* 2.7* 2.6* 2.6*   < > = values in this interval not displayed.   No results for input(s): LIPASE, AMYLASE in the last 168 hours. No results for input(s): AMMONIA in the last 168 hours. Coagulation Profile: No results for input(s): INR, PROTIME in the last 168 hours. Cardiac Enzymes: No results for input(s): CKTOTAL, CKMB, CKMBINDEX, TROPONINI in the last 168 hours. BNP (last 3 results) No results for input(s): PROBNP in the last 8760 hours. HbA1C: No results for input(s): HGBA1C in the last 72 hours. CBG: Recent Labs  Lab 03/22/20 1644 03/22/20 2127 03/23/20 0543 03/23/20 0630 03/23/20 1151  GLUCAP 173* 212* 91 92 87   Lipid Profile: No results for input(s): CHOL, HDL, LDLCALC,  TRIG, CHOLHDL, LDLDIRECT in the last 72 hours. Thyroid Function Tests: No results for input(s): TSH, T4TOTAL, FREET4, T3FREE, THYROIDAB in the last 72 hours. Anemia Panel: No results for input(s): VITAMINB12, FOLATE, FERRITIN, TIBC, IRON, RETICCTPCT in the last 72 hours. Sepsis Labs: No results for input(s): PROCALCITON, LATICACIDVEN in the last 168 hours.  Recent Results (from the past 240 hour(s))  Culture, blood (Routine X 2) w Reflex to ID Panel     Status: None   Collection Time: 03/14/20  5:59 PM   Specimen: BLOOD LEFT HAND  Result Value Ref Range Status   Specimen Description BLOOD LEFT HAND  Final   Special Requests   Final    BOTTLES DRAWN AEROBIC AND ANAEROBIC Blood Culture adequate volume   Culture   Final    NO GROWTH 5 DAYS Performed at [facility], [address], [place], [place] [address]    Report Status 03/19/2020 FINAL  Final  Culture, blood (Routine X 2) w Reflex to ID Panel     Status: None   Collection Time: 03/14/20 10:09 PM   Specimen: BLOOD  Result Value Ref Range Status   Specimen Description BLOOD LEFT HAND  Final   Special Requests   Final    BOTTLES DRAWN AEROBIC ONLY Blood Culture adequate volume   Culture   Final    NO GROWTH 5 DAYS Performed at [facility], [place] [address] [place], [place] [address]    Report Status 03/19/2020 FINAL  Final  Culture, blood (Routine X 2) w Reflex to ID Panel     Status: Abnormal   Collection Time: 03/17/20  3:54 PM   Specimen: BLOOD LEFT ARM  Result Value Ref Range Status   Specimen Description BLOOD LEFT ARM  Final   Special Requests   Final    BOTTLES DRAWN AEROBIC ONLY Blood Culture adequate volume   Culture  Setup Time   Final    GRAM NEGATIVE RODS AEROBIC BOTTLE ONLY CRITICAL RESULT CALLED TO, READ BACK BY AND VERIFIED WITH: A. Rogers Blocker  PharmD 8:45 03/18/20 ([email or web address]) Performed at [facility], [place] [address] [place], [place] [address]    Culture (A)  Final    ESCHERICHIA COLI Confirmed  Extended Spectrum Beta-Lactamase Producer (ESBL).  In bloodstream infections from ESBL organisms, carbapenems are preferred over piperacillin/tazobactam. They are shown to have a lower risk of mortality.    Report Status 03/20/2020 FINAL  Final   Organism ID, Bacteria ESCHERICHIA COLI  Final      Susceptibility   Escherichia coli - MIC*    AMPICILLIN >=32 RESISTANT Resistant     CEFAZOLIN >=64 RESISTANT Resistant     CEFEPIME >=32 RESISTANT Resistant     CEFTAZIDIME >=64 RESISTANT Resistant     CEFTRIAXONE >=64 RESISTANT Resistant     CIPROFLOXACIN >=4 RESISTANT Resistant     GENTAMICIN <=1 SENSITIVE Sensitive     IMIPENEM <=0.25 SENSITIVE Sensitive     TRIMETH/SULFA >=320 RESISTANT Resistant     AMPICILLIN/SULBACTAM >=32 RESISTANT Resistant     PIP/TAZO 64 INTERMEDIATE Intermediate     * ESCHERICHIA COLI  Culture, blood (Routine X 2) w Reflex to ID Panel     Status: Abnormal   Collection Time: 03/17/20  3:54 PM   Specimen: BLOOD LEFT ARM  Result Value Ref Range Status   Specimen Description BLOOD LEFT ARM  Final   Special Requests   Final    BOTTLES DRAWN AEROBIC ONLY Blood Culture adequate volume   Culture  Setup Time   Final    GRAM NEGATIVE RODS AEROBIC BOTTLE ONLY CRITICAL VALUE NOTED.  VALUE IS CONSISTENT WITH PREVIOUSLY REPORTED AND CALLED VALUE.    Culture (A)  Final    ESCHERICHIA COLI SUSCEPTIBILITIES PERFORMED ON PREVIOUS CULTURE WITHIN THE LAST 5 DAYS. Performed at [facility], [place] [address] [place], [place] [address]    Report Status 03/20/2020 FINAL  Final  Blood Culture ID Panel (Reflexed)     Status: Abnormal   Collection Time: 03/17/20  3:54 PM  Result Value Ref Range Status   Enterococcus faecalis NOT DETECTED NOT DETECTED Final   Enterococcus Faecium NOT DETECTED NOT DETECTED Final   Listeria monocytogenes NOT DETECTED NOT DETECTED Final   Staphylococcus species NOT DETECTED NOT DETECTED Final   Staphylococcus aureus (BCID) NOT DETECTED NOT  DETECTED Final   Staphylococcus epidermidis NOT DETECTED NOT DETECTED Final   Staphylococcus lugdunensis NOT DETECTED NOT DETECTED Final   Streptococcus species NOT DETECTED NOT DETECTED Final   Streptococcus agalactiae NOT DETECTED NOT DETECTED Final   Streptococcus pneumoniae NOT DETECTED NOT DETECTED Final   Streptococcus pyogenes NOT DETECTED NOT DETECTED Final   A.calcoaceticus-baumannii NOT DETECTED NOT DETECTED Final   Bacteroides fragilis NOT DETECTED NOT DETECTED Final   Enterobacterales DETECTED (A) NOT DETECTED Final    Comment: Enterobacterales represent a large order of gram negative bacteria, not a single organism. CRITICAL RESULT CALLED TO, READ BACK BY AND VERIFIED WITH: A. Rogers Blocker PharmD 8:45 03/18/20 ([email or web address])    Enterobacter cloacae complex NOT DETECTED NOT DETECTED Final   Escherichia coli DETECTED (A) NOT DETECTED Final    Comment: CRITICAL RESULT CALLED TO, READ BACK BY AND VERIFIED WITH: A. Rogers Blocker PharmD 8:45 03/18/20 ([email or web address])    Klebsiella aerogenes NOT DETECTED NOT DETECTED Final   Klebsiella oxytoca NOT DETECTED NOT DETECTED Final   Klebsiella pneumoniae NOT DETECTED NOT DETECTED Final   Proteus species NOT DETECTED NOT DETECTED Final   Salmonella species NOT DETECTED NOT DETECTED Final   Serratia marcescens NOT DETECTED NOT DETECTED  Final   Haemophilus influenzae NOT DETECTED NOT DETECTED Final   Neisseria meningitidis NOT DETECTED NOT DETECTED Final   Pseudomonas aeruginosa NOT DETECTED NOT DETECTED Final   Stenotrophomonas maltophilia NOT DETECTED NOT DETECTED Final   Candida albicans NOT DETECTED NOT DETECTED Final   Candida auris NOT DETECTED NOT DETECTED Final   Candida glabrata NOT DETECTED NOT DETECTED Final   Candida krusei NOT DETECTED NOT DETECTED Final   Candida parapsilosis NOT DETECTED NOT DETECTED Final   Candida tropicalis NOT DETECTED NOT DETECTED Final   Cryptococcus neoformans/gattii NOT DETECTED NOT DETECTED Final   CTX-M ESBL DETECTED (A)  NOT DETECTED Final    Comment: CRITICAL RESULT CALLED TO, READ BACK BY AND VERIFIED WITH: A. Rogers Blocker PharmD 8:45 03/18/20 ([email or web address]) (NOTE) Extended spectrum beta-lactamase detected. Recommend a carbapenem as initial therapy.      Carbapenem resistance IMP NOT DETECTED NOT DETECTED Final   Carbapenem resistance KPC NOT DETECTED NOT DETECTED Final   Carbapenem resistance NDM NOT DETECTED NOT DETECTED Final   Carbapenem resist OXA 48 LIKE NOT DETECTED NOT DETECTED Final   Carbapenem resistance VIM NOT DETECTED NOT DETECTED Final    Comment: Performed at [facility], [place] [address] [place], [place] [address]  Culture, blood (routine x 2)     Status: None (Preliminary result)   Collection Time: 03/19/20  4:02 PM   Specimen: BLOOD LEFT ARM  Result Value Ref Range Status   Specimen Description BLOOD LEFT ARM  Final   Special Requests   Final    BOTTLES DRAWN AEROBIC ONLY Blood Culture adequate volume   Culture   Final    NO GROWTH 4 DAYS Performed at [facility], [place] [address] [place], [place] [address]    Report Status PENDING  Incomplete  Culture, blood (routine x 2)     Status: None (Preliminary result)   Collection Time: 03/19/20  4:02 PM   Specimen: BLOOD LEFT ARM  Result Value Ref Range Status   Specimen Description BLOOD LEFT ARM  Final   Special Requests   Final    BOTTLES DRAWN AEROBIC ONLY Blood Culture adequate volume   Culture   Final    NO GROWTH 4 DAYS Performed at [facility], [place] [address] [place],  [address]    Report Status PENDING  Incomplete      Radiology Studies: No results found.   Scheduled Meds: . amiodarone  200 mg Oral BID  . ARIPiprazole  2 mg Oral Daily  . aspirin EC  81 mg Oral Q breakfast  . buPROPion  150 mg Oral Daily  . calcitRIOL  0.25 mcg Oral Q M,W,F  . calcium acetate  2,001 mg Oral TID WC  . Chlorhexidine Gluconate Cloth  6 each Topical Daily  . diltiazem  360 mg Oral Daily  . feeding supplement  (ENSURE ENLIVE)  237 mL Oral BID BM  . insulin aspart  0-5 Units Subcutaneous QHS  . insulin aspart  0-6 Units Subcutaneous TID WC  . insulin aspart  4 Units Subcutaneous TID WC  . insulin detemir  18 Units Subcutaneous BID  . levETIRAcetam  250 mg Oral BID  . levothyroxine  50 mcg Oral QAC breakfast  . mouth rinse  15 mL Mouth Rinse BID  . metoprolol tartrate  100 mg Oral BID  . midodrine  5 mg Oral TID WC  . montelukast  10 mg Oral QHS  . multivitamin  1 tablet Oral QHS  .  pantoprazole  40 mg Oral Daily  . predniSONE  10 mg Oral Q breakfast  . primidone  50 mg Oral Daily  . sodium chloride flush  10-40 mL Intracatheter Q12H  . traZODone  50 mg Oral QHS   Continuous Infusions: . sodium chloride 250 mL (03/17/20 1309)  . heparin 800 Units/hr (03/22/20 1624)  . imipenem-cilastatin 500 mg (03/23/20 1027)     LOS: 13 days    Time spent: 25 minutes spent in the coordination of care today.    Jonnie Finner, DO [facility]  If 7PM-7AM, please contact night-coverage [email or web address] 03/23/2020, 2:17 PM

## 2020-03-24 DIAGNOSIS — J9621 Acute and chronic respiratory failure with hypoxia: Secondary | ICD-10-CM | POA: Diagnosis not present

## 2020-03-24 DIAGNOSIS — N186 End stage renal disease: Secondary | ICD-10-CM | POA: Diagnosis not present

## 2020-03-24 DIAGNOSIS — R7881 Bacteremia: Secondary | ICD-10-CM | POA: Diagnosis not present

## 2020-03-24 DIAGNOSIS — I4891 Unspecified atrial fibrillation: Secondary | ICD-10-CM | POA: Diagnosis not present

## 2020-03-24 DIAGNOSIS — A4181 Sepsis due to Enterococcus: Secondary | ICD-10-CM | POA: Diagnosis not present

## 2020-03-24 DIAGNOSIS — R0602 Shortness of breath: Secondary | ICD-10-CM | POA: Diagnosis not present

## 2020-03-24 LAB — CULTURE, BLOOD (ROUTINE X 2)
Culture: NO GROWTH
Culture: NO GROWTH
Special Requests: ADEQUATE
Special Requests: ADEQUATE

## 2020-03-24 LAB — RENAL FUNCTION PANEL
Albumin: 2.8 g/dL — ABNORMAL LOW (ref 3.5–5.0)
Anion gap: 13 (ref 5–15)
BUN: 42 mg/dL — ABNORMAL HIGH (ref 8–23)
CO2: 27 mmol/L (ref 22–32)
Calcium: 10 mg/dL (ref 8.9–10.3)
Chloride: 97 mmol/L — ABNORMAL LOW (ref 98–111)
Creatinine, Ser: 3.91 mg/dL — ABNORMAL HIGH (ref 0.44–1.00)
GFR calc Af Amer: 13 mL/min — ABNORMAL LOW (ref >60)
GFR calc non Af Amer: 11 mL/min — ABNORMAL LOW (ref >60)
Glucose, Bld: 64 mg/dL — ABNORMAL LOW (ref 70–99)
Phosphorus: 2.9 mg/dL (ref 2.5–4.6)
Potassium: 4.9 mmol/L (ref 3.5–5.1)
Sodium: 137 mmol/L (ref 135–145)

## 2020-03-24 LAB — GLUCOSE, CAPILLARY
Glucose-Capillary: 123 mg/dL — ABNORMAL HIGH (ref 70–99)
Glucose-Capillary: 48 mg/dL — ABNORMAL LOW (ref 70–99)
Glucose-Capillary: 131 mg/dL — ABNORMAL HIGH (ref 70–99)
Glucose-Capillary: 122 mg/dL — ABNORMAL HIGH (ref 70–99)
Glucose-Capillary: 71 mg/dL (ref 70–99)

## 2020-03-24 LAB — CBC
HCT: 31.8 % — ABNORMAL LOW (ref 36.0–46.0)
Hemoglobin: 10.1 g/dL — ABNORMAL LOW (ref 12.0–15.0)
MCH: 35.8 pg — ABNORMAL HIGH (ref 26.0–34.0)
MCHC: 31.8 g/dL (ref 30.0–36.0)
MCV: 112.8 fL — ABNORMAL HIGH (ref 80.0–100.0)
Platelets: 175 10*3/uL (ref 150–400)
RBC: 2.82 MIL/uL — ABNORMAL LOW (ref 3.87–5.11)
RDW: 14.9 % (ref 11.5–15.5)
WBC: 10.8 10*3/uL — ABNORMAL HIGH (ref 4.0–10.5)
nRBC: 0 % (ref 0.0–0.2)

## 2020-03-24 LAB — SARS CORONAVIRUS 2 BY RT PCR (HOSPITAL ORDER, PERFORMED IN ~~LOC~~ HOSPITAL LAB): SARS Coronavirus 2: NEGATIVE

## 2020-03-24 LAB — MAGNESIUM: Magnesium: 2.4 mg/dL (ref 1.7–2.4)

## 2020-03-24 MED ORDER — INSULIN DETEMIR 100 UNIT/ML ~~LOC~~ SOLN
14.0000 [IU] | Freq: Every day | SUBCUTANEOUS | Status: DC
  Administered 2020-03-24 – 2020-03-25 (×2): 14 [IU] via SUBCUTANEOUS
  Filled 2020-03-24 (×3): qty 0.14

## 2020-03-24 MED ORDER — INSULIN DETEMIR 100 UNIT/ML ~~LOC~~ SOLN
18.0000 [IU] | Freq: Every day | SUBCUTANEOUS | Status: DC
  Administered 2020-03-25 – 2020-03-26 (×2): 18 [IU] via SUBCUTANEOUS
  Filled 2020-03-24 (×2): qty 0.18

## 2020-03-24 MED ORDER — DEXTROSE 50 % IV SOLN
INTRAVENOUS | Status: AC
  Administered 2020-03-24: 25 mL
  Filled 2020-03-24: qty 50

## 2020-03-24 NOTE — Progress Notes (Signed)
CBG 48, gave 38ml D50 went back to 123, MD notified, will continue to monitor Thanks Arvella Nigh RN.

## 2020-03-24 NOTE — Progress Notes (Signed)
   03/24/20 1106  Clinical Encounter Type  Visited With Patient  Visit Type Initial;Spiritual support;Other (Comment) (HCPOA)  Referral From Palliative care team  Consult/Referral To Chaplain  This chaplain responded to PMT initial consult for HCPOA.  The chaplain checked in with the Pt. RN-Heather before the visit.  The chaplain talked to MD-[place] outside the Pt. room. The chaplain association with PMT was shared. The chaplain was unable to answer the MD questions. The chaplain introduced herself to the Pt. The chaplain communicated the Pt pain update and inappropriate placement of nasal canula with the NT. The NT and RN responded as the Pt. vomited.    The chaplain listened as the Pt. communicated her difficulty with dialysis and her wonder, if God was finished with her. The chaplain offered God's ongoing love for the Pt. The Pt. shared she has friends at [facility], but Pt. niece-[name] is the only family. The Pt. is hopeful Helene Kelp will visit. The chaplain exited as the Pt. Was cleaned up and will F/U with HCPOA.

## 2020-03-24 NOTE — Progress Notes (Signed)
[facility] Progress Note   Subjective: Oriented X 2, seen in room.  No new /co.   Objective Vitals:   03/23/20 2135 03/23/20 2221 03/24/20 0300 03/24/20 0352  BP: 136/80   124/63  Pulse:  92  74  Resp: 20 18    Temp: 97.7 F (36.5 C)   97.7 F (36.5 C)  TempSrc: Oral   Oral  SpO2:  99%  97%  Weight:   88.2 kg   Height:       Physical Exam General: obese older female in NAD Heart: Irregularly irregular. S1,S2 Afib with BBB on monitor. Rate 80-100s Lungs: Decreased in bases otherwise CTAB Abdomen: obese active BS Extremities: RUE trace pitting edema distal to AVG. Heel protector boots in place,No BLE edema Dialysis Access: RUA AVG + bruit    Additional Objective Labs: Basic Metabolic Panel: Recent Labs  Lab 03/22/20 0725 03/23/20 0908 03/24/20 0541  NA 129* 135 137  K 4.4 4.5 4.9  CL 93* 97* 97*  CO2 24 24 27   GLUCOSE 170* 84 64*  BUN 49* 26* 42*  CREATININE 4.36* 3.18* 3.91*  CALCIUM 9.8 9.2 10.0  PHOS 4.0 2.6 2.9   Liver Function Tests: Recent Labs  Lab 03/18/20 0727 03/19/20 0544 03/22/20 0725 03/23/20 0908 03/24/20 0541  AST 38  --   --   --   --   ALT 487*  --   --   --   --   ALKPHOS 117  --   --   --   --   BILITOT 0.8  --   --   --   --   PROT 5.0*  --   --   --   --   ALBUMIN 2.8*  2.7*   < > 2.6* 2.6* 2.8*   < > = values in this interval not displayed.   No results for input(s): LIPASE, AMYLASE in the last 168 hours. CBC: Recent Labs  Lab 03/20/20 0837 03/20/20 0837 03/21/20 1822 03/21/20 1822 03/22/20 0725 03/23/20 0908 03/24/20 0541  WBC 13.7*   < > 12.5*   < > 10.5 9.7 10.8*  NEUTROABS  --   --  11.5*  --   --  6.7  --   HGB 9.8*   < > 10.1*   < > 9.0* 9.6* 10.1*  HCT 30.2*   < > 31.5*   < > 28.7* 30.5* 31.8*  MCV 110.2*  --  110.9*  --  110.8* 113.8* 112.8*  PLT 132*   < > 136*   < > 141* 152 175   < > = values in this interval not displayed.   Blood Culture    Component Value Date/Time   SDES BLOOD  LEFT ARM 03/19/2020 1602   SDES BLOOD LEFT ARM 03/19/2020 1602   SPECREQUEST  03/19/2020 1602    BOTTLES DRAWN AEROBIC ONLY Blood Culture adequate volume   SPECREQUEST  03/19/2020 1602    BOTTLES DRAWN AEROBIC ONLY Blood Culture adequate volume   CULT  03/19/2020 1602    NO GROWTH 5 DAYS Performed at [facility], [place] [address] [place], [place] [address]    CULT  03/19/2020 1602    NO GROWTH 5 DAYS Performed at [facility] [address] [place], [place] [address]    REPTSTATUS 03/24/2020 FINAL 03/19/2020 1602   REPTSTATUS 03/24/2020 FINAL 03/19/2020 1602    Cardiac Enzymes: No results for input(s): CKTOTAL, CKMB, CKMBINDEX, TROPONINI in the last  168 hours. CBG: Recent Labs  Lab 03/23/20 1151 03/23/20 1601 03/23/20 2107 03/24/20 0635 03/24/20 0706  GLUCAP 87 166* 235* 48* 123*   Iron Studies: No results for input(s): IRON, TIBC, TRANSFERRIN, FERRITIN in the last 72 hours. @lablastinr3 @ Studies/Results: No results found. Medications: . sodium chloride 250 mL (03/17/20 1309)  . imipenem-cilastatin 500 mg (03/24/20 0937)   . amiodarone  200 mg Oral BID  . apixaban  5 mg Oral BID  . ARIPiprazole  2 mg Oral Daily  . aspirin EC  81 mg Oral Q breakfast  . buPROPion  150 mg Oral Daily  . calcitRIOL  0.25 mcg Oral Q M,W,F  . calcium acetate  2,001 mg Oral TID WC  . Chlorhexidine Gluconate Cloth  6 each Topical Daily  . diltiazem  360 mg Oral Daily  . feeding supplement (ENSURE ENLIVE)  237 mL Oral BID BM  . insulin aspart  0-5 Units Subcutaneous QHS  . insulin aspart  0-6 Units Subcutaneous TID WC  . insulin aspart  4 Units Subcutaneous TID WC  . insulin detemir  18 Units Subcutaneous BID  . levETIRAcetam  250 mg Oral BID  . levothyroxine  50 mcg Oral QAC breakfast  . mouth rinse  15 mL Mouth Rinse BID  . metoprolol tartrate  100 mg Oral BID  . midodrine  5 mg Oral TID WC  . montelukast  10 mg Oral QHS  . multivitamin  1 tablet Oral QHS  .  pantoprazole  40 mg Oral Daily  . predniSONE  10 mg Oral Q breakfast  . primidone  50 mg Oral Daily  . sodium chloride flush  10-40 mL Intracatheter Q12H  . traZODone  50 mg Oral QHS     [facility].4 [facility] HD Bath2/2.5, Dialyzerunknown, Heparindoes not appear to receive heparin with her treatment, only in the catheter. Using AVG.   Assessment/Plan: 1. Enterococcal bacteremia with septic shock- sepsis resolved. Seen by ID, recommending 2 weeks of IV abx. Not candidate for TEE per cardiology.  2. ESRD- Initially on CRRT now transitioned back to  IHD. Next HD 03/25/2020. Pt on HD x 4 yrs w/ poor QOL per the patient.  Family is making decisions. Would suggest in the future that we focus on conservative care for this patient w/ long-term cognitive deficits, considerable debility , FTT and poor QOL. HD tomorrow.  3. A fib with RVR- on amio.Rate controlled at present. On heparin gtt.  4. Hypotension/ volume- BP's improved with midodrine. UP 3kg w/ bilat LE edema. ^UF goals this week as tolerated w/ midodrine.  5. AMS- waxes and wanes. Oriented X 2. may be her baseline.  6. Acute Hypoxic respiratory failure- resolved 7. ABLA- from [facility] central line. S/p transfusion. Eliquis held and FFP given. HGB 9.0 03/22/2020 8. Elevated ALT- improving. 9. N/V-resolved 10. SHPT: Ca 9.8 C Ca 10.9 PO4 at goal. Stopped VDRA. May need to switch to none calcium based binders. Follow labs.  11. Disposition- Seen by Palliative Care 08/06. Palliative care able to contact niece. Plan is that patient is now DNR and will return to SNF when ready.  Continuing HD for now.   Kelly Splinter, MD 03/24/2020, 11:08 AM

## 2020-03-24 NOTE — Plan of Care (Signed)
  Problem: Education: Goal: Knowledge of General Education information will improve Description Including pain rating scale, medication(s)/side effects and non-pharmacologic comfort measures Outcome: Progressing   

## 2020-03-24 NOTE — Discharge Instructions (Signed)

## 2020-03-24 NOTE — Progress Notes (Signed)
PROGRESS NOTE    [name]  KYH:[id number] DOB: [birth year] DOA: 03/10/2020 PCP: Kathleen Chroman, MD   Brief Narrative:   Kathleen Boop Archeris an 69 y.o.femalepast medical history of chronic respiratory failure with hypoxia secondary to underlying COPD on 3 L of oxygen, A. fib on Eliquis end-stage renal disease on hemodialysis chronic diastolic heart failure moderate AS, who came into the hospital for hyperglycemia and tachycardia was found in A. fib with RVR, decompensated systolic heart failure and septic shock who presented to the ED with atrial fibrillation and RVR with a heart rate of 170 was started on a diltiazem drip on arrival to the ED heart rate improved however she developed progressive hypotension and was transitioned to amiodarone due to hypotension PCCM was consulted and started on pressors for septic shock due to right lower lobe pneumonia.  8/9: Patient will continue primaxin through 8/17. I have notified TOC that primaxin is the chosen therapy and am awaiting to hear word if this abx will be taken care of at the SNF. Rpt Bld Cx on 9/1. Tolerating eliquis (Hgb/plts stable). Episode of hypoglycemia again last night. Adjusting insulin.   Assessment & Plan:  Septic shock due to Enterococcus faecalis bacteremia probably due to her HD catheter E coli bacteremia - ID was consulted he was sensitive to Vanco, then switch to Zyvox, then ampicillin now on imipenem due to blood cultures growing ESBL E. coli. - TEE pending - Central line discontinued on 03/16/2020. - Tunnel catheter successfully removed on 03/13/2020. - Bld Cx on 03/10/2020 for Enterococcus. - Bld Cx cultures on 03/17/2020 show ESBL E. Coli     - Bld Cx 8/4 are negative     - 8/9: Primaxin through 8/17; rpt Bld Cx on 9/1. She is stable  Bleeding subclavian line Thrombocytopenia - s/p 1 unit of platelets and 2 units of fresh frozen plasma - Eliquis was held - plts are trending up, no  new bleed noted -8/9: Hgb stable. Plts stable. She is tolerating eliquis. This is resolved.  Persistent atrial fibrillation with RVR: - Probably driven by her infectious bacteremia. - Due to heart control heart rate EP was consulted recommended to continue amiodarone, metoprolol and diltiazem -continue eliquis.  Acute on chronic respiratory failure with hypoxia acute decompensated diastolic heart failure Pleural effusion - on baseline O2 use - volume control w/ HD - w/ baseline O2 use and small effusion, no further w/u at this time  N/V - PRN anti-emetics - KUB negative - she has intermittent N  Acute metabolic/toxic encephalopathy Acute on Chronic encephalopathy/schizophrenia - neurology eval'd; rec'd continuing primidone and Keppra - last ammonia was 50 - acute change is resolved; she is at her baseline per family; hx of schizophrenia  ESRD on HD - per nephrology  Transaminitis - Likely due to shock liver - RUQ US showed no obstructive jaundice. - Transaminases are improving.  Insulin-dependent diabetes mellitus type 2 - A1c: 8.0 - Had hypoglycemic episodes ON, decrease levemir to 18 units BID - continue SSI, DM diet  Elevated troponins - Likely due to demand ischemia, further management per cardiology.  Hypothyroidism: - Continue Synthroid.  Rheumatoid arthritis: - On chronic prednisone continue current regimen.  Gout - denies complaints.  - follow  Schizophrenia - on welbutrin, abilify  Hyperkalemia - resolved on HD  Stage II ulcer present on admission: - RN Pressure Injury Documentation: - Pressure Injury 03/10/20 Heel Left Unstageable - Full thickness tissue loss in which the base of the injury is  covered by slough (yellow, tan, gray, green or brown) and/or eschar (tan, brown or black) in the wound bed. 3 cm x 2 cm  (Active) 03/10/20 1730 Location: Heel Location Orientation: Left Staging: Unstageable - Full thickness tissue loss in which the base of the injury is covered by slough (yellow, tan, gray, green or brown) and/or eschar (tan, brown or black) in the wound bed. Wound Description (Comments): 3 cm x 2 cm Present on Admission: Yes  DVT prophylaxis: eliquis Code Status: DNR Family Communication: None at bedside   Status is: Inpatient  Remains inpatient appropriate because:Inpatient level of care appropriate due to severity of illness   Dispo: The patient is from: SNF              Anticipated d/c is to: SNF              Anticipated d/c date is: 1 day              Patient currently is not medically stable to d/c.  Consultants:   Cardiology  Nephrology  Palliative Care  Antimicrobials:  . Primaxin (through 8/17)   ROS:  Denies N, V, CP, dyspnea, ab pain. Remainder ROS is negative for all not previously mentioned.  Subjective: "Thank you again."  Objective: Vitals:   03/23/20 2221 03/24/20 0300 03/24/20 0352 03/24/20 1100  BP:   124/63   Pulse: 92  74   Resp: 18     Temp:   97.7 F (36.5 C) 98.2 F (36.8 C)  TempSrc:   Oral Oral  SpO2: 99%  97%   Weight:  88.2 kg    Height:        Intake/Output Summary (Last 24 hours) at 03/24/2020 1157 Last data filed at 03/24/2020 0355 Gross per 24 hour  Intake 580 ml  Output 202 ml  Net 378 ml   Filed Weights   03/22/20 0715 03/23/20 0300 03/24/20 0300  Weight: 89.5 kg 86.4 kg 88.2 kg    Examination:  General: 69 y.o. female resting in bed in NAD Cardiovascular: RRR, +S1, S2, no m/g/r, equal pulses throughout Respiratory: CTABL, no w/r/r, normal WOB GI: BS+, NDNT, soft, no masses noted MSK: No e/c/c Neuro: Alert to name, follows commands Psyc: Appropriate interaction and affect, calm/cooperative   Data Reviewed: I have personally reviewed following labs and imaging studies.  CBC: Recent Labs  Lab 03/20/20 0837  03/21/20 1822 03/22/20 0725 03/23/20 0908 03/24/20 0541  WBC 13.7* 12.5* 10.5 9.7 10.8*  NEUTROABS  --  11.5*  --  6.7  --   HGB 9.8* 10.1* 9.0* 9.6* 10.1*  HCT 30.2* 31.5* 28.7* 30.5* 31.8*  MCV 110.2* 110.9* 110.8* 113.8* 112.8*  PLT 132* 136* 141* 152 974   Basic Metabolic Panel: Recent Labs  Lab 03/20/20 0837 03/21/20 1822 03/22/20 0725 03/23/20 0908 03/24/20 0541  NA 135 132* 129* 135 137  K 4.0 5.1 4.4 4.5 4.9  CL 95* 94* 93* 97* 97*  CO2 29 25 24 24 27   GLUCOSE 139* 254* 170* 84 64*  BUN 28* 44* 49* 26* 42*  CREATININE 2.92* 4.07* 4.36* 3.18* 3.91*  CALCIUM 9.2 10.0 9.8 9.2 10.0  MG  --  2.3 2.6* 2.3 2.4  PHOS 2.6 3.8 4.0 2.6 2.9   GFR: Estimated Creatinine Clearance: 14.2 mL/min (A) (by C-G formula based on SCr of 3.91 mg/dL (H)). Liver Function Tests: Recent Labs  Lab 03/18/20 1638 03/19/20 4536 03/20/20 4680 03/21/20 1822 03/22/20 0725 03/23/20 0908 03/24/20 0541  AST 38  --   --   --   --   --   --   ALT 487*  --   --   --   --   --   --   ALKPHOS 117  --   --   --   --   --   --   BILITOT 0.8  --   --   --   --   --   --   PROT 5.0*  --   --   --   --   --   --   ALBUMIN 2.8*  2.7*   < > 2.6* 2.7* 2.6* 2.6* 2.8*   < > = values in this interval not displayed.   No results for input(s): LIPASE, AMYLASE in the last 168 hours. No results for input(s): AMMONIA in the last 168 hours. Coagulation Profile: No results for input(s): INR, PROTIME in the last 168 hours. Cardiac Enzymes: No results for input(s): CKTOTAL, CKMB, CKMBINDEX, TROPONINI in the last 168 hours. BNP (last 3 results) No results for input(s): PROBNP in the last 8760 hours. HbA1C: No results for input(s): HGBA1C in the last 72 hours. CBG: Recent Labs  Lab 03/23/20 1151 03/23/20 1601 03/23/20 2107 03/24/20 0635 03/24/20 0706  GLUCAP 87 166* 235* 48* 123*   Lipid Profile: No results for input(s): CHOL, HDL, LDLCALC, TRIG, CHOLHDL, LDLDIRECT in the last 72 hours. Thyroid  Function Tests: No results for input(s): TSH, T4TOTAL, FREET4, T3FREE, THYROIDAB in the last 72 hours. Anemia Panel: No results for input(s): VITAMINB12, FOLATE, FERRITIN, TIBC, IRON, RETICCTPCT in the last 72 hours. Sepsis Labs: No results for input(s): PROCALCITON, LATICACIDVEN in the last 168 hours.  Recent Results (from the past 240 hour(s))  Culture, blood (Routine X 2) w Reflex to ID Panel     Status: None   Collection Time: 03/14/20  5:59 PM   Specimen: BLOOD LEFT HAND  Result Value Ref Range Status   Specimen Description BLOOD LEFT HAND  Final   Special Requests   Final    BOTTLES DRAWN AEROBIC AND ANAEROBIC Blood Culture adequate volume   Culture   Final    NO GROWTH 5 DAYS Performed at [facility], [address], [place], [place] [address]    Report Status 03/19/2020 FINAL  Final  Culture, blood (Routine X 2) w Reflex to ID Panel     Status: None   Collection Time: 03/14/20 10:09 PM   Specimen: BLOOD  Result Value Ref Range Status   Specimen Description BLOOD LEFT HAND  Final   Special Requests   Final    BOTTLES DRAWN AEROBIC ONLY Blood Culture adequate volume   Culture   Final    NO GROWTH 5 DAYS Performed at [facility], [place] [address], [place], [place] [address]    Report Status 03/19/2020 FINAL  Final  Culture, blood (Routine X 2) w Reflex to ID Panel     Status: Abnormal   Collection Time: 03/17/20  3:54 PM   Specimen: BLOOD LEFT ARM  Result Value Ref Range Status   Specimen Description BLOOD LEFT ARM  Final   Special Requests   Final    BOTTLES DRAWN AEROBIC ONLY Blood Culture adequate volume   Culture  Setup Time   Final    GRAM NEGATIVE RODS AEROBIC BOTTLE ONLY CRITICAL RESULT CALLED TO, READ BACK BY AND VERIFIED WITH: A. [facility] PharmD 8:45 03/18/20 ([email or web address]) Performed at [facility], [place] [address] [place], [place] [address]    Culture (A)  Final    ESCHERICHIA COLI Confirmed Extended Spectrum Beta-Lactamase Producer (ESBL).  In  bloodstream infections from ESBL organisms, carbapenems are preferred over piperacillin/tazobactam. They are shown to have a lower risk of mortality.    Report Status 03/20/2020 FINAL  Final   Organism ID, Bacteria ESCHERICHIA COLI  Final      Susceptibility   Escherichia coli - MIC*    AMPICILLIN >=32 RESISTANT Resistant     CEFAZOLIN >=64 RESISTANT Resistant     CEFEPIME >=32 RESISTANT Resistant     CEFTAZIDIME >=64 RESISTANT Resistant     CEFTRIAXONE >=64 RESISTANT Resistant     CIPROFLOXACIN >=4 RESISTANT Resistant     GENTAMICIN <=1 SENSITIVE Sensitive     IMIPENEM <=0.25 SENSITIVE Sensitive     TRIMETH/SULFA >=320 RESISTANT Resistant     AMPICILLIN/SULBACTAM >=32 RESISTANT Resistant     PIP/TAZO 64 INTERMEDIATE Intermediate     * ESCHERICHIA COLI  Culture, blood (Routine X 2) w Reflex to ID Panel     Status: Abnormal   Collection Time: 03/17/20  3:54 PM   Specimen: BLOOD LEFT ARM  Result Value Ref Range Status   Specimen Description BLOOD LEFT ARM  Final   Special Requests   Final    BOTTLES DRAWN AEROBIC ONLY Blood Culture adequate volume   Culture  Setup Time   Final    GRAM NEGATIVE RODS AEROBIC BOTTLE ONLY CRITICAL VALUE NOTED.  VALUE IS CONSISTENT WITH PREVIOUSLY REPORTED AND CALLED VALUE.    Culture (A)  Final    ESCHERICHIA COLI SUSCEPTIBILITIES PERFORMED ON PREVIOUS CULTURE WITHIN THE LAST 5 DAYS. Performed at [facility], [place] [address], [place], [place] [address]    Report Status 03/20/2020 FINAL  Final  Blood Culture ID Panel (Reflexed)     Status: Abnormal   Collection Time: 03/17/20  3:54 PM  Result Value Ref Range Status   Enterococcus faecalis NOT DETECTED NOT DETECTED Final   Enterococcus Faecium NOT DETECTED NOT DETECTED Final   Listeria monocytogenes NOT DETECTED NOT DETECTED Final   Staphylococcus species NOT DETECTED NOT DETECTED Final   Staphylococcus aureus (BCID) NOT DETECTED NOT DETECTED Final   Staphylococcus epidermidis NOT DETECTED  NOT DETECTED Final   Staphylococcus lugdunensis NOT DETECTED NOT DETECTED Final   Streptococcus species NOT DETECTED NOT DETECTED Final   Streptococcus agalactiae NOT DETECTED NOT DETECTED Final   Streptococcus pneumoniae NOT DETECTED NOT DETECTED Final   Streptococcus pyogenes NOT DETECTED NOT DETECTED Final   A.calcoaceticus-baumannii NOT DETECTED NOT DETECTED Final   Bacteroides fragilis NOT DETECTED NOT DETECTED Final   Enterobacterales DETECTED (A) NOT DETECTED Final    Comment: Enterobacterales represent a large order of gram negative bacteria, not a single organism. CRITICAL RESULT CALLED TO, READ BACK BY AND VERIFIED WITH: A. [facility] Blocker PharmD 8:45 03/18/20 ([email or web address])    Enterobacter cloacae complex NOT DETECTED NOT DETECTED Final   Escherichia coli DETECTED (A) NOT DETECTED Final    Comment: CRITICAL RESULT CALLED TO, READ BACK BY AND VERIFIED WITH: A. Rogers Blocker PharmD 8:45 03/18/20 ([email or web address])    Klebsiella aerogenes NOT DETECTED NOT DETECTED Final   Klebsiella oxytoca NOT DETECTED NOT DETECTED Final   Klebsiella pneumoniae NOT DETECTED NOT DETECTED Final   Proteus species NOT DETECTED NOT DETECTED Final   Salmonella species NOT DETECTED NOT DETECTED Final   Serratia marcescens NOT DETECTED NOT DETECTED Final   Haemophilus influenzae NOT DETECTED NOT  DETECTED Final   Neisseria meningitidis NOT DETECTED NOT DETECTED Final   Pseudomonas aeruginosa NOT DETECTED NOT DETECTED Final   Stenotrophomonas maltophilia NOT DETECTED NOT DETECTED Final   Candida albicans NOT DETECTED NOT DETECTED Final   Candida auris NOT DETECTED NOT DETECTED Final   Candida glabrata NOT DETECTED NOT DETECTED Final   Candida krusei NOT DETECTED NOT DETECTED Final   Candida parapsilosis NOT DETECTED NOT DETECTED Final   Candida tropicalis NOT DETECTED NOT DETECTED Final   Cryptococcus neoformans/gattii NOT DETECTED NOT DETECTED Final   CTX-M ESBL DETECTED (A) NOT DETECTED Final    Comment: CRITICAL RESULT CALLED TO,  READ BACK BY AND VERIFIED WITH: A. Rogers Blocker PharmD 8:45 03/18/20 ([email or web address]) (NOTE) Extended spectrum beta-lactamase detected. Recommend a carbapenem as initial therapy.      Carbapenem resistance IMP NOT DETECTED NOT DETECTED Final   Carbapenem resistance KPC NOT DETECTED NOT DETECTED Final   Carbapenem resistance NDM NOT DETECTED NOT DETECTED Final   Carbapenem resist OXA 48 LIKE NOT DETECTED NOT DETECTED Final   Carbapenem resistance VIM NOT DETECTED NOT DETECTED Final    Comment: Performed at [facility], [place] [address] [place], [place] [address]  Culture, blood (routine x 2)     Status: None   Collection Time: 03/19/20  4:02 PM   Specimen: BLOOD LEFT ARM  Result Value Ref Range Status   Specimen Description BLOOD LEFT ARM  Final   Special Requests   Final    BOTTLES DRAWN AEROBIC ONLY Blood Culture adequate volume   Culture   Final    NO GROWTH 5 DAYS Performed at [facility], [address], [place], [place] [address]    Report Status 03/24/2020 FINAL  Final  Culture, blood (routine x 2)     Status: None   Collection Time: 03/19/20  4:02 PM   Specimen: BLOOD LEFT ARM  Result Value Ref Range Status   Specimen Description BLOOD LEFT ARM  Final   Special Requests   Final    BOTTLES DRAWN AEROBIC ONLY Blood Culture adequate volume   Culture   Final    NO GROWTH 5 DAYS Performed at [facility], [place] [address], [place], [place] [address]    Report Status 03/24/2020 FINAL  Final      Radiology Studies: No results found.   Scheduled Meds: . amiodarone  200 mg Oral BID  . apixaban  5 mg Oral BID  . ARIPiprazole  2 mg Oral Daily  . aspirin EC  81 mg Oral Q breakfast  . buPROPion  150 mg Oral Daily  . calcitRIOL  0.25 mcg Oral Q M,W,F  . calcium acetate  2,001 mg Oral TID WC  . Chlorhexidine Gluconate Cloth  6 each Topical Daily  . diltiazem  360 mg Oral Daily  . feeding supplement (ENSURE ENLIVE)  237 mL Oral BID BM  . insulin aspart  0-5 Units  Subcutaneous QHS  . insulin aspart  0-6 Units Subcutaneous TID WC  . insulin aspart  4 Units Subcutaneous TID WC  . insulin detemir  18 Units Subcutaneous BID  . levETIRAcetam  250 mg Oral BID  . levothyroxine  50 mcg Oral QAC breakfast  . mouth rinse  15 mL Mouth Rinse BID  . metoprolol tartrate  100 mg Oral BID  . midodrine  5 mg Oral TID WC  . montelukast  10 mg Oral QHS  . multivitamin  1 tablet Oral QHS  . pantoprazole  40 mg Oral Daily  . predniSONE  10 mg Oral Q breakfast  . primidone  50 mg Oral Daily  . sodium chloride flush  10-40 mL Intracatheter Q12H  . traZODone  50 mg Oral QHS   Continuous Infusions: . sodium chloride 250 mL (03/17/20 1309)  . imipenem-cilastatin 500 mg (03/24/20 0937)     LOS: 14 days    Time spent: 25 minutes spent in the coordination of care today.    Jonnie Finner, DO [facility]  If 7PM-7AM, please contact night-coverage [email or web address] 03/24/2020, 11:57 AM

## 2020-03-24 NOTE — Progress Notes (Signed)
Inpatient Diabetes Program Recommendations  AACE/ADA: New Consensus Statement on Inpatient Glycemic Control (2015)  Target Ranges:  Prepandial:   less than 140 mg/dL      Peak postprandial:   less than 180 mg/dL (1-2 hours)      Critically ill patients:  140 - 180 mg/dL   Lab Results  Component Value Date   GLUCAP 123 (H) 03/24/2020   HGBA1C 8.0 (H) 03/10/2020    Review of Glycemic Control  Diabetes history: DM 2 Outpatient Diabetes medications: SNF meds Trulicity 1.5 mg Qweek + Novolog 10 units TID + Novolog 2-12 units TID per SSI Current orders for Inpatient glycemic control:  Levemir 18 units bid Novolog 0-6 units tid + hs Novolog 4 units tid meal coverage  PO prednisone 10 mg Daily Ensure Enlive bid between meals  Inpatient Diabetes Program Recommendations:    Note glucose trends increase after PO Supplement. Hypoglycemia this am.   -  Consider reducing Levemir to 14 units qpm and leave Levemir 18 units qam.  - Consider consulting dietitian to see if another supplement would be more appropriate for glucose control.   Thanks,  Tama Headings RN, MSN, BC-ADM Inpatient Diabetes Coordinator Team Pager [phone] (8a-5p)

## 2020-03-24 NOTE — TOC Progression Note (Signed)
Transition of Care [facility]) - Progression Note    Patient Details  Name: [name] MRN: [id number] Date of Birth: July 22, 1951  Transition of Care [facility]) CM/SW Contact  Sharlet Salina Mila Homer, LCSW Phone Number: 03/24/2020, 1:26 PM  Clinical Narrative:  CSW informed this morning by MD that patient medically stable for discharge. Contact made with Fraser Din, admissions director at [facility] and update provided. A secure chat was later rec'd from MD (12:35 pm) indicating that due to hypoglycemia and adjusting insulin patient will d/c tomorrow. Melissa updated.  CSW will continue to follow and facilitate discharge to [facility] when medically stable.    Expected Discharge Plan: [place] Barriers to Discharge: Continued Medical Work up  Expected Discharge Plan and Services Expected Discharge Plan: [place] arrangements for the past 2 months: [place]                                     Social Determinants of Health (SDOH) Interventions  No SDOH interventions needed or requested at this time.  Readmission Risk Interventions Readmission Risk Prevention Plan 03/11/2020 09/10/2019 11/29/2018  Transportation Screening Complete Complete Complete  Medication Review Press photographer) - Complete Complete  PCP or Specialist appointment within 3-5 days of discharge - - Not Complete  PCP/Specialist Appt Not Complete comments - - earliest appt available was 12/08/18  [place] or Home Care Consult - - Complete  SW Recovery Care/Counseling Consult Complete - Complete  Palliative Care Screening - - Not Applicable  Skilled Nursing Facility Complete - Not Applicable  Some recent data might be hidden

## 2020-03-24 NOTE — Progress Notes (Signed)
Physical Therapy Treatment Patient Details Name: REANNE NELLUMS MRN: [id number] DOB: 1951-06-24 Today's Date: 03/24/2020    History of Present Illness 69 yo admitted from SNF with SOB and AFib with RVR, decompensated heart failure and septic shock with metabolic encephalopathy. PMHx: CHF, AFib, ESRD, RA, Schizophrenia    PT Comments    Pt admitted with above diagnosis. Pt Was able to stand and pivot with max assist of 2 to get to the chair using a RW.  Pt with posteriorlean and was very fatigued after transfer. Did a few exercises but had difficulty following commands for exercise.  Pt was smiling once in chair.   Pt currently with functional limitations due to balance and endurance deficits. Pt will benefit from skilled PT to increase their independence and safety with mobility to allow discharge to the venue listed below.     Follow Up Recommendations  SNF     Equipment Recommendations  None recommended by PT    Recommendations for Other Services       Precautions / Restrictions Precautions Precautions: Fall Precaution Comments: watch HR Restrictions Weight Bearing Restrictions: No    Mobility  Bed Mobility Overal bed mobility: Needs Assistance Bed Mobility: Rolling;Supine to Sit Rolling: Min assist   Supine to sit: Min assist     General bed mobility comments: assist for trunk elevation   Transfers Overall transfer level: Needs assistance Equipment used: Rolling walker (2 wheeled);1 person hand held assist Transfers: Sit to/from [facility] Sit to Stand: Max assist;+2 physical assistance;From elevated surface Stand pivot transfers: Max assist;+2 physical assistance       General transfer comment: pt is quite weak and requires max assist to launch standing with posterior lean that persists as she takes pivotal steps to recliner requiring incr assist for stand pivot transfer with RW.   Ambulation/Gait Ambulation/Gait assistance:  (unable to  progress steps.)           General Gait Details: did not walk due to weakness   Stairs             Wheelchair Mobility    Modified Rankin (Stroke Patients Only)       Balance Overall balance assessment: Needs assistance Sitting-balance support: Feet supported;Bilateral upper extremity supported;No upper extremity supported Sitting balance-Leahy Scale: Fair Sitting balance - Comments: close min guard for sitting blance    Standing balance support: Bilateral upper extremity supported Standing balance-Leahy Scale: Poor Standing balance comment: reliant on BIL UE on RW as well as max external support due to significant posterior lean with weight on bil Heels.                             Cognition Arousal/Alertness: Awake/alert Behavior During Therapy: Flat affect Overall Cognitive Status: Difficult to assess                                 General Comments: minimal verbalizations      Exercises General Exercises - Lower Extremity Ankle Circles/Pumps: AROM;5 reps Long Arc Quad: AROM;10 reps;Both;Seated    General Comments General comments (skin integrity, edema, etc.): VSS      Pertinent Vitals/Pain Pain Assessment: No/denies pain    Home Living                      Prior Function  PT Goals (current goals can now be found in the care plan section) Acute Rehab PT Goals PT Goal Formulation: With patient Time For Goal Achievement: 04/07/20 Potential to Achieve Goals: Fair Progress towards PT goals: Progressing toward goals    Frequency    Min 2X/week      PT Plan Current plan remains appropriate    Co-evaluation              AM-PAC PT "6 Clicks" Mobility   Outcome Measure  Help needed turning from your back to your side while in a flat bed without using bedrails?: A Lot Help needed moving from lying on your back to sitting on the side of a flat bed without using bedrails?: A Lot Help needed  moving to and from a bed to a chair (including a wheelchair)?: A Lot Help needed standing up from a chair using your arms (e.g., wheelchair or bedside chair)?: A Lot Help needed to walk in hospital room?: Total Help needed climbing 3-5 steps with a railing? : Total 6 Click Score: 10    End of Session Equipment Utilized During Treatment: Gait belt;Oxygen Activity Tolerance: Patient limited by fatigue Patient left: with call bell/phone within reach;in chair;with chair alarm set Nurse Communication: Mobility status PT Visit Diagnosis: Other abnormalities of gait and mobility (R26.89);Muscle weakness (generalized) (M62.81)     Time: 9480-1655 PT Time Calculation (min) (ACUTE ONLY): 12 min  Charges:  $Therapeutic Activity: 8-22 mins                     Geralda Baumgardner W,PT Acute Rehabilitation Services Pager:  [phone]  Office:  [place] 03/24/2020, 3:31 PM

## 2020-03-24 NOTE — Progress Notes (Signed)
PT Cancellation Note  Patient Details Name: KRISTEE ANGUS MRN: [id number] DOB: [birth year]   Cancelled Treatment:    Reason Eval/Treat Not Completed: Medical issues which prohibited therapy (Pt vomiting.  Will check back. )   Denice Paradise 03/24/2020, 11:28 AM Dawn W,PT Acute Rehabilitation Services Pager:  [phone]  Office:  [phone]

## 2020-03-25 DIAGNOSIS — J9621 Acute and chronic respiratory failure with hypoxia: Secondary | ICD-10-CM | POA: Diagnosis not present

## 2020-03-25 DIAGNOSIS — A4181 Sepsis due to Enterococcus: Secondary | ICD-10-CM | POA: Diagnosis not present

## 2020-03-25 DIAGNOSIS — R7881 Bacteremia: Secondary | ICD-10-CM | POA: Diagnosis not present

## 2020-03-25 DIAGNOSIS — I4891 Unspecified atrial fibrillation: Secondary | ICD-10-CM | POA: Diagnosis not present

## 2020-03-25 DIAGNOSIS — Z515 Encounter for palliative care: Secondary | ICD-10-CM | POA: Diagnosis not present

## 2020-03-25 DIAGNOSIS — Z7189 Other specified counseling: Secondary | ICD-10-CM | POA: Diagnosis not present

## 2020-03-25 DIAGNOSIS — N186 End stage renal disease: Secondary | ICD-10-CM | POA: Diagnosis not present

## 2020-03-25 DIAGNOSIS — R0602 Shortness of breath: Secondary | ICD-10-CM | POA: Diagnosis not present

## 2020-03-25 LAB — CBC
HCT: 27.8 % — ABNORMAL LOW (ref 36.0–46.0)
Hemoglobin: 8.9 g/dL — ABNORMAL LOW (ref 12.0–15.0)
MCH: 35.7 pg — ABNORMAL HIGH (ref 26.0–34.0)
MCHC: 32 g/dL (ref 30.0–36.0)
MCV: 111.6 fL — ABNORMAL HIGH (ref 80.0–100.0)
Platelets: 195 10*3/uL (ref 150–400)
RBC: 2.49 MIL/uL — ABNORMAL LOW (ref 3.87–5.11)
RDW: 14.8 % (ref 11.5–15.5)
WBC: 10.5 10*3/uL (ref 4.0–10.5)
nRBC: 0 % (ref 0.0–0.2)

## 2020-03-25 LAB — GLUCOSE, CAPILLARY
Glucose-Capillary: 138 mg/dL — ABNORMAL HIGH (ref 70–99)
Glucose-Capillary: 125 mg/dL — ABNORMAL HIGH (ref 70–99)
Glucose-Capillary: 267 mg/dL — ABNORMAL HIGH (ref 70–99)

## 2020-03-25 LAB — RENAL FUNCTION PANEL
Albumin: 2.5 g/dL — ABNORMAL LOW (ref 3.5–5.0)
Anion gap: 14 (ref 5–15)
BUN: 51 mg/dL — ABNORMAL HIGH (ref 8–23)
CO2: 26 mmol/L (ref 22–32)
Calcium: 9.9 mg/dL (ref 8.9–10.3)
Chloride: 95 mmol/L — ABNORMAL LOW (ref 98–111)
Creatinine, Ser: 4.49 mg/dL — ABNORMAL HIGH (ref 0.44–1.00)
GFR calc Af Amer: 11 mL/min — ABNORMAL LOW (ref >60)
GFR calc non Af Amer: 9 mL/min — ABNORMAL LOW (ref >60)
Glucose, Bld: 106 mg/dL — ABNORMAL HIGH (ref 70–99)
Phosphorus: 3.1 mg/dL (ref 2.5–4.6)
Potassium: 4.7 mmol/L (ref 3.5–5.1)
Sodium: 135 mmol/L (ref 135–145)

## 2020-03-25 MED ORDER — LORAZEPAM 2 MG/ML PO CONC: 0.5000 mg | ORAL | Status: DC | PRN

## 2020-03-25 MED ORDER — GLYCOPYRROLATE 0.2 MG/ML IJ SOLN: 0.2000 mg | INTRAMUSCULAR | Status: DC | PRN

## 2020-03-25 MED ORDER — BIOTENE DRY MOUTH MT LIQD: 15.0000 mL | OROMUCOSAL | Status: DC | PRN

## 2020-03-25 MED ORDER — OXYCODONE HCL 5 MG PO TABS
5.0000 mg | ORAL_TABLET | ORAL | Status: DC | PRN
  Administered 2020-03-25: 5 mg via ORAL
  Filled 2020-03-25: qty 1

## 2020-03-25 MED ORDER — HALOPERIDOL LACTATE 5 MG/ML IJ SOLN: 0.5000 mg | INTRAMUSCULAR | Status: DC | PRN

## 2020-03-25 MED ORDER — HALOPERIDOL 0.5 MG PO TABS
0.5000 mg | ORAL_TABLET | ORAL | Status: DC | PRN
  Filled 2020-03-25: qty 1

## 2020-03-25 MED ORDER — HALOPERIDOL LACTATE 2 MG/ML PO CONC
0.5000 mg | ORAL | Status: DC | PRN
  Filled 2020-03-25: qty 0.3

## 2020-03-25 MED ORDER — GLYCOPYRROLATE 1 MG PO TABS
1.0000 mg | ORAL_TABLET | ORAL | Status: DC | PRN
  Filled 2020-03-25: qty 1

## 2020-03-25 MED ORDER — POLYVINYL ALCOHOL 1.4 % OP SOLN
1.0000 [drp] | Freq: Four times a day (QID) | OPHTHALMIC | Status: DC | PRN
  Filled 2020-03-25: qty 15

## 2020-03-25 MED ORDER — LORAZEPAM 2 MG/ML IJ SOLN: 0.5000 mg | INTRAMUSCULAR | Status: DC | PRN

## 2020-03-25 MED ORDER — LORAZEPAM 0.5 MG PO TABS
0.5000 mg | ORAL_TABLET | ORAL | Status: DC | PRN
  Administered 2020-03-26: 0.5 mg via ORAL
  Filled 2020-03-25: qty 1

## 2020-03-25 NOTE — Progress Notes (Addendum)
PROGRESS NOTE    Kathleen MCGUIRT  YOV:[id number] DOB: 12-26-1950 DOA: 03/10/2020 PCP: Glenda Chroman, MD   Brief Narrative:   Kathleen Boop Archeris an 69 y.o.femalepast medical history of chronic respiratory failure with hypoxia secondary to underlying COPD on 3 L of oxygen, A. fib on Eliquis end-stage renal disease on hemodialysis chronic diastolic heart failure moderate AS, who came into the hospital for hyperglycemia and tachycardia was found in A. fib with RVR, decompensated systolic heart failure and septic shock who presented to the ED with atrial fibrillation and RVR with a heart rate of 170 was started on a diltiazem drip on arrival to the ED heart rate improved however she developed progressive hypotension and was transitioned to amiodarone due to hypotension PCCM was consulted and started on pressors for septic shock due to right lower lobe pneumonia.  8/10: Spoke with neprho. Pt informed him that she no longer wanted to continue HD sessions. Nursing also reports that pt has refused HD sessions. I spoke with the pt's HCPOA and informed her of the situation and what stopping HD means. The HCPOA is in agreement with the pt and in agreement with proceeding to hospice. I have notified PC and nephrology of my conversation with the [place]. We will get her transitioned to hospice.    Assessment & Plan: Septic shock due to Enterococcus faecalis bacteremia probably due to her HD catheter E coli bacteremia - ID was consulted he was sensitive to Vanco, then switch to Zyvox, then ampicillin now on imipenem due to blood cultures growing ESBL E. coli. - TEE pending - Central line discontinued on 03/16/2020. - Tunnel catheter successfully removed on 03/13/2020. - Bld Cx on 03/10/2020 for Enterococcus. - Bld Cx cultures on 03/17/2020 show ESBL E. Coli - Bld Cx 8/4 are negative     - Primaxin through 8/17; rpt Bld Cx on 9/1. She is stable     - 8/10: see 'ESRD on HD'  below  ESRD on HD - per nephrology     - 8/10: Pt has notified nephrology and nursing that she no longer wants HD sessions. Spoke with HCPOA today and explained what this means for her overall prognosis. HCPOA notes that her aunt seems miserable and has voiced a lack of motivation to continue. HCPOA agrees with hospice. PC has been notified. Stop aggressive care and work on placement.   Persistent atrial fibrillation with RVR: - Probably driven by her infectious bacteremia. - Due to heart control heart rate EP was consulted recommended to continue amiodarone, metoprolol and diltiazem -continue eliquis.     - 8/10: d/c eliquis; family agrees with hospice placement  N/V - PRN anti-emetics - KUB negative - she has intermittent N  Acute on chronic respiratory failure with hypoxia acute decompensated diastolic heart failure Pleural effusion - on baseline O2 use - volume control w/ HD - w/ baseline O2 use and small effusion, no further w/u at this time  Bleeding subclavian line Thrombocytopenia - s/p 1 unit of platelets and 2 units of fresh frozen plasma - Eliquis was held - plts are trending up, no new bleed noted - Hgb stable. Plts stable. She is tolerating eliquis. This is resolved.  Acute metabolic/toxic encephalopathy Acute on Chronic encephalopathy/schizophrenia Transaminitis Insulin-dependent diabetes mellitus type 2 Elevated troponins Hypothyroidism: Rheumatoid arthritis: Gout Schizophrenia Hyperkalemia Stage II ulcer present on admission DNR/Hospice pt   DVT prophylaxis: eliiquis Code Status: DNR Family Communication: Spoke with niece by phone   Status is: Inpatient  Remains  inpatient appropriate because:Inpatient level of care appropriate due to severity of illness   Dispo: The patient is from: SNF              Anticipated d/c is to: SNF              Anticipated d/c date is: 1 day               Patient currently is not medically stable to d/c.  Consultants:   Cardiology  Nephrology  Palliative Care  Antimicrobials:  . primaxin   ROS:  Denies CP, dyspnea, CP, N, V. Remainder ROS is negative for all not previously mentioned.  Subjective: "Do you think I will leave?"  Objective: Vitals:   03/24/20 2320 03/25/20 0000 03/25/20 0422 03/25/20 0423  BP: 136/80 112/69  122/78  Pulse: (!) 110 99  (!) 103  Resp: 16 17  17   Temp:  (!) 97.3 F (36.3 C)  98.3 F (36.8 C)  TempSrc:  Oral  Oral  SpO2: 100% 97%  98%  Weight:   88.9 kg   Height:        Intake/Output Summary (Last 24 hours) at 03/25/2020 1008 Last data filed at 03/25/2020 6834 Gross per 24 hour  Intake 536.52 ml  Output 103 ml  Net 433.52 ml   Filed Weights   03/23/20 0300 03/24/20 0300 03/25/20 0422  Weight: 86.4 kg 88.2 kg 88.9 kg    Examination:  General: 69 y.o. female resting in bed in NAD Cardiovascular: RRR, +S1, S2, no m/g/r Respiratory: CTABL, no w/r/r, normal WOB GI: BS+, NDNT, soft MSK: No e/c/c; RUE AVG Neuro: alert to name, follows commands Psyc: calm/cooperative   Data Reviewed: I have personally reviewed following labs and imaging studies.  CBC: Recent Labs  Lab 03/21/20 1822 03/22/20 0725 03/23/20 0908 03/24/20 0541 03/25/20 0747  WBC 12.5* 10.5 9.7 10.8* 10.5  NEUTROABS 11.5*  --  6.7  --   --   HGB 10.1* 9.0* 9.6* 10.1* 8.9*  HCT 31.5* 28.7* 30.5* 31.8* 27.8*  MCV 110.9* 110.8* 113.8* 112.8* 111.6*  PLT 136* 141* 152 175 196   Basic Metabolic Panel: Recent Labs  Lab 03/21/20 1822 03/22/20 0725 03/23/20 0908 03/24/20 0541 03/25/20 0747  NA 132* 129* 135 137 135  K 5.1 4.4 4.5 4.9 4.7  CL 94* 93* 97* 97* 95*  CO2 25 24 24 27 26   GLUCOSE 254* 170* 84 64* 106*  BUN 44* 49* 26* 42* 51*  CREATININE 4.07* 4.36* 3.18* 3.91* 4.49*  CALCIUM 10.0 9.8 9.2 10.0 9.9  MG 2.3 2.6* 2.3 2.4  --   PHOS 3.8 4.0 2.6 2.9 3.1   GFR: Estimated Creatinine Clearance: 12.4  mL/min (A) (by C-G formula based on SCr of 4.49 mg/dL (H)). Liver Function Tests: Recent Labs  Lab 03/21/20 1822 03/22/20 0725 03/23/20 0908 03/24/20 0541 03/25/20 0747  ALBUMIN 2.7* 2.6* 2.6* 2.8* 2.5*   No results for input(s): LIPASE, AMYLASE in the last 168 hours. No results for input(s): AMMONIA in the last 168 hours. Coagulation Profile: No results for input(s): INR, PROTIME in the last 168 hours. Cardiac Enzymes: No results for input(s): CKTOTAL, CKMB, CKMBINDEX, TROPONINI in the last 168 hours. BNP (last 3 results) No results for input(s): PROBNP in the last 8760 hours. HbA1C: No results for input(s): HGBA1C in the last 72 hours. CBG: Recent Labs  Lab 03/24/20 0706 03/24/20 1227 03/24/20 1630 03/24/20 2121 03/25/20 0611  GLUCAP 123* 131* 122* 71 138*  Lipid Profile: No results for input(s): CHOL, HDL, LDLCALC, TRIG, CHOLHDL, LDLDIRECT in the last 72 hours. Thyroid Function Tests: No results for input(s): TSH, T4TOTAL, FREET4, T3FREE, THYROIDAB in the last 72 hours. Anemia Panel: No results for input(s): VITAMINB12, FOLATE, FERRITIN, TIBC, IRON, RETICCTPCT in the last 72 hours. Sepsis Labs: No results for input(s): PROCALCITON, LATICACIDVEN in the last 168 hours.  Recent Results (from the past 240 hour(s))  Culture, blood (Routine X 2) w Reflex to ID Panel     Status: Abnormal   Collection Time: 03/17/20  3:54 PM   Specimen: BLOOD LEFT ARM  Result Value Ref Range Status   Specimen Description BLOOD LEFT ARM  Final   Special Requests   Final    BOTTLES DRAWN AEROBIC ONLY Blood Culture adequate volume   Culture  Setup Time   Final    GRAM NEGATIVE RODS AEROBIC BOTTLE ONLY CRITICAL RESULT CALLED TO, READ BACK BY AND VERIFIED WITH: A. Rogers Blocker PharmD 8:45 03/18/20 ([email or web address]) Performed at [facility], [place] [address] [place], [place] [address]    Culture (A)  Final    ESCHERICHIA COLI Confirmed Extended Spectrum Beta-Lactamase Producer (ESBL).  In  bloodstream infections from ESBL organisms, carbapenems are preferred over piperacillin/tazobactam. They are shown to have a lower risk of mortality.    Report Status 03/20/2020 FINAL  Final   Organism ID, Bacteria ESCHERICHIA COLI  Final      Susceptibility   Escherichia coli - MIC*    AMPICILLIN >=32 RESISTANT Resistant     CEFAZOLIN >=64 RESISTANT Resistant     CEFEPIME >=32 RESISTANT Resistant     CEFTAZIDIME >=64 RESISTANT Resistant     CEFTRIAXONE >=64 RESISTANT Resistant     CIPROFLOXACIN >=4 RESISTANT Resistant     GENTAMICIN <=1 SENSITIVE Sensitive     IMIPENEM <=0.25 SENSITIVE Sensitive     TRIMETH/SULFA >=320 RESISTANT Resistant     AMPICILLIN/SULBACTAM >=32 RESISTANT Resistant     PIP/TAZO 64 INTERMEDIATE Intermediate     * ESCHERICHIA COLI  Culture, blood (Routine X 2) w Reflex to ID Panel     Status: Abnormal   Collection Time: 03/17/20  3:54 PM   Specimen: BLOOD LEFT ARM  Result Value Ref Range Status   Specimen Description BLOOD LEFT ARM  Final   Special Requests   Final    BOTTLES DRAWN AEROBIC ONLY Blood Culture adequate volume   Culture  Setup Time   Final    GRAM NEGATIVE RODS AEROBIC BOTTLE ONLY CRITICAL VALUE NOTED.  VALUE IS CONSISTENT WITH PREVIOUSLY REPORTED AND CALLED VALUE.    Culture (A)  Final    ESCHERICHIA COLI SUSCEPTIBILITIES PERFORMED ON PREVIOUS CULTURE WITHIN THE LAST 5 DAYS. Performed at [facility], [place] [address] [place], [place] [address]    Report Status 03/20/2020 FINAL  Final  Blood Culture ID Panel (Reflexed)     Status: Abnormal   Collection Time: 03/17/20  3:54 PM  Result Value Ref Range Status   Enterococcus faecalis NOT DETECTED NOT DETECTED Final   Enterococcus Faecium NOT DETECTED NOT DETECTED Final   Listeria monocytogenes NOT DETECTED NOT DETECTED Final   Staphylococcus species NOT DETECTED NOT DETECTED Final   Staphylococcus aureus (BCID) NOT DETECTED NOT DETECTED Final   Staphylococcus epidermidis NOT DETECTED  NOT DETECTED Final   Staphylococcus lugdunensis NOT DETECTED NOT DETECTED Final   Streptococcus species NOT DETECTED NOT DETECTED Final   Streptococcus agalactiae NOT DETECTED NOT DETECTED Final   Streptococcus pneumoniae  NOT DETECTED NOT DETECTED Final   Streptococcus pyogenes NOT DETECTED NOT DETECTED Final   A.calcoaceticus-baumannii NOT DETECTED NOT DETECTED Final   Bacteroides fragilis NOT DETECTED NOT DETECTED Final   Enterobacterales DETECTED (A) NOT DETECTED Final    Comment: Enterobacterales represent a large order of gram negative bacteria, not a single organism. CRITICAL RESULT CALLED TO, READ BACK BY AND VERIFIED WITH: A. Rogers Blocker PharmD 8:45 03/18/20 ([email or web address])    Enterobacter cloacae complex NOT DETECTED NOT DETECTED Final   Escherichia coli DETECTED (A) NOT DETECTED Final    Comment: CRITICAL RESULT CALLED TO, READ BACK BY AND VERIFIED WITH: A. Rogers Blocker PharmD 8:45 03/18/20 ([email or web address])    Klebsiella aerogenes NOT DETECTED NOT DETECTED Final   Klebsiella oxytoca NOT DETECTED NOT DETECTED Final   Klebsiella pneumoniae NOT DETECTED NOT DETECTED Final   Proteus species NOT DETECTED NOT DETECTED Final   Salmonella species NOT DETECTED NOT DETECTED Final   Serratia marcescens NOT DETECTED NOT DETECTED Final   Haemophilus influenzae NOT DETECTED NOT DETECTED Final   Neisseria meningitidis NOT DETECTED NOT DETECTED Final   Pseudomonas aeruginosa NOT DETECTED NOT DETECTED Final   Stenotrophomonas maltophilia NOT DETECTED NOT DETECTED Final   Candida albicans NOT DETECTED NOT DETECTED Final   Candida auris NOT DETECTED NOT DETECTED Final   Candida glabrata NOT DETECTED NOT DETECTED Final   Candida krusei NOT DETECTED NOT DETECTED Final   Candida parapsilosis NOT DETECTED NOT DETECTED Final   Candida tropicalis NOT DETECTED NOT DETECTED Final   Cryptococcus neoformans/gattii NOT DETECTED NOT DETECTED Final   CTX-M ESBL DETECTED (A) NOT DETECTED Final    Comment: CRITICAL RESULT CALLED TO,  READ BACK BY AND VERIFIED WITH: A. Rogers Blocker PharmD 8:45 03/18/20 ([email or web address]) (NOTE) Extended spectrum beta-lactamase detected. Recommend a carbapenem as initial therapy.      Carbapenem resistance IMP NOT DETECTED NOT DETECTED Final   Carbapenem resistance KPC NOT DETECTED NOT DETECTED Final   Carbapenem resistance NDM NOT DETECTED NOT DETECTED Final   Carbapenem resist OXA 48 LIKE NOT DETECTED NOT DETECTED Final   Carbapenem resistance VIM NOT DETECTED NOT DETECTED Final    Comment: Performed at [facility], [place] [address] [place], [place] [address]  Culture, blood (routine x 2)     Status: None   Collection Time: 03/19/20  4:02 PM   Specimen: BLOOD LEFT ARM  Result Value Ref Range Status   Specimen Description BLOOD LEFT ARM  Final   Special Requests   Final    BOTTLES DRAWN AEROBIC ONLY Blood Culture adequate volume   Culture   Final    NO GROWTH 5 DAYS Performed at [facility], [address], [place], [place] [address]    Report Status 03/24/2020 FINAL  Final  Culture, blood (routine x 2)     Status: None   Collection Time: 03/19/20  4:02 PM   Specimen: BLOOD LEFT ARM  Result Value Ref Range Status   Specimen Description BLOOD LEFT ARM  Final   Special Requests   Final    BOTTLES DRAWN AEROBIC ONLY Blood Culture adequate volume   Culture   Final    NO GROWTH 5 DAYS Performed at [facility], [place] [address], [place], [place] [address]    Report Status 03/24/2020 FINAL  Final  SARS Coronavirus 2 by RT PCR (hospital order, performed in [facility] hospital lab) Nasopharyngeal Nasopharyngeal Swab     Status: None   Collection Time: 03/24/20  2:25 PM  Specimen: Nasopharyngeal Swab  Result Value Ref Range Status   SARS Coronavirus 2 NEGATIVE NEGATIVE Final    Comment: (NOTE) SARS-CoV-2 target nucleic acids are NOT DETECTED.  The SARS-CoV-2 RNA is generally detectable in upper and lower respiratory specimens during the acute phase of infection. The  lowest concentration of SARS-CoV-2 viral copies this assay can detect is 250 copies / mL. A negative result does not preclude SARS-CoV-2 infection and should not be used as the sole basis for treatment or other patient management decisions.  A negative result may occur with improper specimen collection / handling, submission of specimen other than nasopharyngeal swab, presence of viral mutation(s) within the areas targeted by this assay, and inadequate number of viral copies (<250 copies / mL). A negative result must be combined with clinical observations, patient history, and epidemiological information.  Fact Sheet for Patients:   StrictlyIdeas.no  Fact Sheet for Healthcare Providers: BankingDealers.co.za  This test is not yet approved or  cleared by the [place] FDA and has been authorized for detection and/or diagnosis of SARS-CoV-2 by FDA under an Emergency Use Authorization (EUA).  This EUA will remain in effect (meaning this test can be used) for the duration of the COVID-19 declaration under Section 564(b)(1) of the Act, 21 U.S.C. section 360bbb-3(b)(1), unless the authorization is terminated or revoked sooner.  Performed at [facility], [place] [address], [place], [place] [address]       Radiology Studies: No results found.   Scheduled Meds: . amiodarone  200 mg Oral BID  . apixaban  5 mg Oral BID  . ARIPiprazole  2 mg Oral Daily  . aspirin EC  81 mg Oral Q breakfast  . buPROPion  150 mg Oral Daily  . calcitRIOL  0.25 mcg Oral Q M,W,F  . calcium acetate  2,001 mg Oral TID WC  . Chlorhexidine Gluconate Cloth  6 each Topical Daily  . diltiazem  360 mg Oral Daily  . feeding supplement (ENSURE ENLIVE)  237 mL Oral BID BM  . insulin aspart  0-5 Units Subcutaneous QHS  . insulin aspart  0-6 Units Subcutaneous TID WC  . insulin aspart  4 Units Subcutaneous TID WC  . insulin detemir  14 Units Subcutaneous QHS  .  insulin detemir  18 Units Subcutaneous Daily  . levETIRAcetam  250 mg Oral BID  . levothyroxine  50 mcg Oral QAC breakfast  . mouth rinse  15 mL Mouth Rinse BID  . metoprolol tartrate  100 mg Oral BID  . midodrine  5 mg Oral TID WC  . montelukast  10 mg Oral QHS  . multivitamin  1 tablet Oral QHS  . pantoprazole  40 mg Oral Daily  . predniSONE  10 mg Oral Q breakfast  . primidone  50 mg Oral Daily  . sodium chloride flush  10-40 mL Intracatheter Q12H  . traZODone  50 mg Oral QHS   Continuous Infusions: . sodium chloride 250 mL (03/17/20 1309)  . imipenem-cilastatin 500 mg (03/24/20 2220)     LOS: 15 days    Time spent: 25 minutes spent in the coordination of care today.    Jonnie Finner, DO [facility]  If 7PM-7AM, please contact night-coverage [email or web address] 03/25/2020, 10:08 AM

## 2020-03-25 NOTE — Progress Notes (Signed)
Occupational Therapy Treatment Patient Details Name: [name] MRN: [id number] DOB: 1951/01/09 Today's Date: 03/25/2020    History of present illness 69 yo admitted from SNF with SOB and AFib with RVR, decompensated heart failure and septic shock with metabolic encephalopathy. PMHx: CHF, AFib, ESRD, RA, Schizophrenia   OT comments  Patient continues to make steady progress towards goals in skilled OT session. Patient's session encompassed on theraband exercises at bed level due to pt complying to dialysis later in the morning. Affect remains flat when conversing with pt, requiring increased multi modal cues in order to comply to theraband exercises. Pt markedly deconditioned, having to take breaks after a few repetitions, only managing five per side before needing an extended break. HR elevated to 119 during periods of rest and inactivity. Pt would continue to greatly benefit from skilled services in order to progress towards goals; will continue to follow acutely.    Follow Up Recommendations  SNF    Equipment Recommendations  Wheelchair (measurements OT);Wheelchair cushion (measurements OT);Hospital bed    Recommendations for Other Services      Precautions / Restrictions Precautions Precautions: Fall Precaution Comments: watch HR Restrictions Weight Bearing Restrictions: No       Mobility Bed Mobility                  Transfers                      Balance                                           ADL either performed or assessed with clinical judgement   ADL Overall ADL's : Needs assistance/impaired                                       General ADL Comments: Session focus on theraband exercises as pt has been refusin dialysis and finally agreed to go later this morning     Vision       Perception     Praxis      Cognition Arousal/Alertness: Awake/alert Behavior During Therapy: Flat affect Overall  Cognitive Status: Difficult to assess                                 General Comments: minimal verbalizations        Exercises General Exercises - Upper Extremity Shoulder Horizontal ABduction: AROM;10 reps;Theraband;Both Theraband Level (Shoulder Horizontal Abduction): Level 1 (Yellow) Shoulder Horizontal ADduction: AROM;10 reps;Theraband;Both Theraband Level (Shoulder Horizontal Adduction): Level 1 (Yellow) Elbow Flexion: AROM;Both;5 reps;Theraband Theraband Level (Elbow Flexion): Level 1 (Yellow) Elbow Extension: AROM;Both;5 reps;Theraband Theraband Level (Elbow Extension): Level 1 (Yellow) Other Exercises Other Exercises: Attempted overhead shoulder pull downs, limited by body habitus and ROM Other Exercises: Scapular pull downs across body, 2 sets of 5 with level 1 theraband   Shoulder Instructions       General Comments      Pertinent Vitals/ Pain       Pain Assessment: Faces Faces Pain Scale: Hurts little more Pain Location: Generalized Pain Intervention(s): Patient requesting pain meds-RN notified;Limited activity within patient's tolerance;Monitored during session  Home Living  Prior Functioning/Environment              Frequency  Min 2X/week        Progress Toward Goals  OT Goals(current goals can now be found in the care plan section)  Progress towards OT goals: Progressing toward goals  Acute Rehab OT Goals Patient Stated Goal: to stop dialysis OT Goal Formulation: With patient Time For Goal Achievement: 03/25/20 Potential to Achieve Goals: Good  Plan Discharge plan remains appropriate    Co-evaluation                 AM-PAC OT "6 Clicks" Daily Activity     Outcome Measure   Help from another person eating meals?: A Little Help from another person taking care of personal grooming?: A Little Help from another person toileting, which includes using toliet,  bedpan, or urinal?: Total Help from another person bathing (including washing, rinsing, drying)?: A Lot Help from another person to put on and taking off regular upper body clothing?: A Lot Help from another person to put on and taking off regular lower body clothing?: Total 6 Click Score: 12    End of Session Equipment Utilized During Treatment: Oxygen  OT Visit Diagnosis: Muscle weakness (generalized) (M62.81);Unsteadiness on feet (R26.81)   Activity Tolerance Patient limited by fatigue;Patient limited by lethargy   Patient Left in bed;with call bell/phone within reach   Nurse Communication Mobility status;Precautions;Patient requests pain meds        Time: 1657-9038 OT Time Calculation (min): 15 min  Charges: OT General Charges $OT Visit: 1 Visit OT Treatments $Therapeutic Exercise: 8-22 mins  Corinne Ports E. Minor Iden, COTA/L Acute Rehabilitation Services [place] 03/25/2020, 10:21 AM

## 2020-03-25 NOTE — Progress Notes (Signed)
[facility] Progress Note   Subjective: pt refused HD this am, see other prog note  Objective Vitals:   03/24/20 2320 03/25/20 0000 03/25/20 0422 03/25/20 0423  BP: 136/80 112/69  122/78  Pulse: (!) 110 99  (!) 103  Resp: 16 17  17   Temp:  (!) 97.3 F (36.3 C)  98.3 F (36.8 C)  TempSrc:  Oral  Oral  SpO2: 100% 97%  98%  Weight:   88.9 kg   Height:       Physical Exam General: obese older female in NAD Heart: Irregularly irregular. S1,S2 Afib with BBB on monitor. Rate 80-100s Lungs: Decreased in bases otherwise CTAB Abdomen: obese active BS Extremities: RUE trace pitting edema distal to AVG. Heel protector boots in place,No BLE edema Dialysis Access: RUA AVG + bruit    Additional Objective Labs: Basic Metabolic Panel: Recent Labs  Lab 03/23/20 0908 03/24/20 0541 03/25/20 0747  NA 135 137 135  K 4.5 4.9 4.7  CL 97* 97* 95*  CO2 24 27 26   GLUCOSE 84 64* 106*  BUN 26* 42* 51*  CREATININE 3.18* 3.91* 4.49*  CALCIUM 9.2 10.0 9.9  PHOS 2.6 2.9 3.1   Liver Function Tests: Recent Labs  Lab 03/23/20 0908 03/24/20 0541 03/25/20 0747  ALBUMIN 2.6* 2.8* 2.5*   No results for input(s): LIPASE, AMYLASE in the last 168 hours. CBC: Recent Labs  Lab 03/21/20 1822 03/21/20 1822 03/22/20 0725 03/22/20 0725 03/23/20 0908 03/24/20 0541 03/25/20 0747  WBC 12.5*   < > 10.5   < > 9.7 10.8* 10.5  NEUTROABS 11.5*  --   --   --  6.7  --   --   HGB 10.1*   < > 9.0*   < > 9.6* 10.1* 8.9*  HCT 31.5*   < > 28.7*   < > 30.5* 31.8* 27.8*  MCV 110.9*  --  110.8*  --  113.8* 112.8* 111.6*  PLT 136*   < > 141*   < > 152 175 195   < > = values in this interval not displayed.   Blood Culture    Component Value Date/Time   SDES BLOOD LEFT ARM 03/19/2020 1602   SDES BLOOD LEFT ARM 03/19/2020 1602   SPECREQUEST  03/19/2020 1602    BOTTLES DRAWN AEROBIC ONLY Blood Culture adequate volume   SPECREQUEST  03/19/2020 1602    BOTTLES DRAWN AEROBIC ONLY Blood  Culture adequate volume   CULT  03/19/2020 1602    NO GROWTH 5 DAYS Performed at [facility], [place] [address], [place], [place] [address]    CULT  03/19/2020 1602    NO GROWTH 5 DAYS Performed at [facility] [address] [place],  [address]    REPTSTATUS 03/24/2020 FINAL 03/19/2020 1602   REPTSTATUS 03/24/2020 FINAL 03/19/2020 1602    Cardiac Enzymes: No results for input(s): CKTOTAL, CKMB, CKMBINDEX, TROPONINI in the last 168 hours. CBG: Recent Labs  Lab 03/24/20 0706 03/24/20 1227 03/24/20 1630 03/24/20 2121 03/25/20 0611  GLUCAP 123* 131* 122* 71 138*   Iron Studies: No results for input(s): IRON, TIBC, TRANSFERRIN, FERRITIN in the last 72 hours. @lablastinr3 @ Studies/Results: No results found. Medications: . sodium chloride 250 mL (03/17/20 1309)  . imipenem-cilastatin 500 mg (03/24/20 2220)   . amiodarone  200 mg Oral BID  . apixaban  5 mg Oral BID  . ARIPiprazole  2 mg Oral Daily  . aspirin EC  81 mg Oral Q breakfast  .  buPROPion  150 mg Oral Daily  . calcitRIOL  0.25 mcg Oral Q M,W,F  . calcium acetate  2,001 mg Oral TID WC  . Chlorhexidine Gluconate Cloth  6 each Topical Daily  . diltiazem  360 mg Oral Daily  . feeding supplement (ENSURE ENLIVE)  237 mL Oral BID BM  . insulin aspart  0-5 Units Subcutaneous QHS  . insulin aspart  0-6 Units Subcutaneous TID WC  . insulin aspart  4 Units Subcutaneous TID WC  . insulin detemir  14 Units Subcutaneous QHS  . insulin detemir  18 Units Subcutaneous Daily  . levETIRAcetam  250 mg Oral BID  . levothyroxine  50 mcg Oral QAC breakfast  . mouth rinse  15 mL Mouth Rinse BID  . metoprolol tartrate  100 mg Oral BID  . midodrine  5 mg Oral TID WC  . montelukast  10 mg Oral QHS  . multivitamin  1 tablet Oral QHS  . pantoprazole  40 mg Oral Daily  . predniSONE  10 mg Oral Q breakfast  . primidone  50 mg Oral Daily  . sodium chloride flush  10-40 mL Intracatheter Q12H  . traZODone  50 mg Oral QHS      [facility].4 [facility] Bath2/2.5, Dialyzerunknown, Heparindoes not appear to receive heparin with her treatment, only in the catheter. Using AVG.   Assessment/Plan: 1. Enterococcal bacteremia with septic shock- sepsis resolved. Seen by ID, recommending 2 weeks of IV abx. Not candidate for TEE per cardiology.  2. ESRD- Initially on CRRT now transitioned back to  IHD. Next HD 03/25/2020. Pt on HD x 4 yrs w/ poor QOL per the patient.  Today she doesn't want to do HD and is telling me she does not want any more dialysis permanently. We support this decision given her poor QOL and multiple comorbidities.  Have d/w attending who will d/w further w/ family. Holding HD for now.  3. A fib with RVR- on amio.Rate controlled at present. On heparin gtt.  4. Hypotension/ volume- BP's improved with midodrine. UP 3kg w/ bilat LE edema. ^UF goals this week as tolerated w/ midodrine.  5. AMS- waxes and wanes. Oriented X 2. may be her baseline.  6. Acute Hypoxic respiratory failure- resolved 7. ABLA- from [facility] central line. S/p transfusion. Eliquis held and FFP given. HGB 9.0 03/22/2020 8. Elevated ALT- improving. 9. N/V-resolved 10. SHPT: Ca 9.8 C Ca 10.9 PO4 at goal. Stopped VDRA. May need to switch to none calcium based binders. Follow labs.  11. Disposition- Seen by Palliative Care 08/06. Palliative care able to contact niece. Pt is now DNR.   Kelly Splinter, MD 03/25/2020, 9:44 AM

## 2020-03-25 NOTE — Progress Notes (Signed)
   03/25/20 1102  Clinical Encounter Type  Visited With Patient  Visit Type Follow-up;Spiritual support  Referral From Palliative care team  Consult/Referral To Chaplain  Spiritual Encounters  Spiritual Needs Prayer  This chaplain followed up with PMT Pt. spiritual care and consult for HCPOA.  The chaplain introduced herself and updated the Pt. RN-Melva.  The Pt. continues to desire the opportunity to reconnect with her niece-[name]. The Pt. wishes to tell Helene Kelp she loves her and she doesn't want HD anymore. The chaplain is questioning the Pt. ability to complete HCPOA at this time.  The chaplain learned the Pt. has a special friend-Sarah at [facility]. The Pt. enjoys word searches with Judson Roch.  The Pt. accepted the chaplain's invitation for prayer and a F/U spiritual care.   During the chaplain's presence with the Pt., the Pt. communicated her desire for HD one more time today. The Pt.continues to share HD is painful. The Pt. shared the experience of pain while she urinates with the chaplain.[name] Kitchen

## 2020-03-25 NOTE — Progress Notes (Signed)
Just spoke w/ patient about refusing HD this am.  She tells me just now that she doesn't want dialysis today and doesn't want any dialysis anymore, "ever".  Yesterday and the day before she has been telling him how difficult her experience has been with dialysis the last several years. I attempted to reach family yesterday by phone to discuss possible transition to hospice.  I believe transition to hospice and HD withdrawal for this patient should be considered seriously.  Given that the pt has psychiatric issues there will need to be family involvement and I have discussed this w/ the attending.  Will hold on dialysis for now pending further discussions about EOL.    Kelly Splinter, MD 03/25/2020, 9:42 AM

## 2020-03-25 NOTE — Progress Notes (Signed)
Daily Progress Note   Patient Name: [name]       Date: 03/25/2020 DOB: 06-25-1951  Age: 69 y.o. MRN#: [id number] Attending Physician: Jonnie Finner, DO Primary Care Physician: Glenda Chroman, MD Admit Date: 03/10/2020  Reason for Consultation/Follow-up: Establishing goals of care  Subjective: Chart reviewed including personal review of pertinent labs and imaging.  I saw and examined [name] today.  She was awake and alert and lying in bed in no distress.  She was eager to engage in conversation but remained very simplistic with answers to questions.  She told me as well that she does not want to receive further dialysis moving forward.  We discussed what clinical course would likely look like without further dialysis with very short prognosis and continued progression of her renal failure.  We also discussed that the goal moving forward is to maintain her comfort and forego further dialysis, I also would recommend that we discontinue further antibiotic therapy as well.  She expressed understanding that time will be short if she foregoes further dialysis and antibiotic therapy.  She states that, "it is okay."  She tells me that she really just wants to go back to [facility] and work some word search puzzles in a familiar environment with people that she knows.  She was very happy to be able to see her niece earlier this week and she was in agreement with it calling her to discuss plan moving forward.  I called and was able to reach her niece, [name].  [name] tells me that she was able to speak with Dr. Marylyn Ishihara this morning and understands [name]'s desire to forego further dialysis.  She reports this is always hard to consider, but that she understands that her and his not been  well for a very long period of time.  She also expressed that she does not want her to suffer moving forward and wants to ensure that anything is done to keep her as comfortable as possible for whatever time she has left.  We discussed hospice benefits and how extra layer of support through hospice will be able to ensure the symptoms are well managed moving forward.  She expressed understanding and is hopeful that we can work out placement back to [facility] with hospice services following.  We  did discuss anticipated prognosis is likely in the period of days and most people who forego further dialysis die within 7 to 10 days after stopping treatment.  I also discussed with patient as well as [name] that we will be working to modify her regimen whenever she leaves the hospital to focus more on comfort.  She is currently receiving multiple medications that I would recommend stopping as they will not continue to serve her well long-term if we are stopping dialysis.  This would include stopping medications related to dialysis, regular sugar checks and insulin correction, and likely her other heart medications in short order as it becomes more difficult for her to take pills effectively.  I also discussed adding medications for comfort including pain, anxiety, shortness of breath, nausea, agitation, and itching.   Length of Stay: 15  Current Medications: Scheduled Meds:  . amiodarone  200 mg Oral BID  . apixaban  5 mg Oral BID  . ARIPiprazole  2 mg Oral Daily  . aspirin EC  81 mg Oral Q breakfast  . buPROPion  150 mg Oral Daily  . calcitRIOL  0.25 mcg Oral Q M,W,F  . calcium acetate  2,001 mg Oral TID WC  . Chlorhexidine Gluconate Cloth  6 each Topical Daily  . diltiazem  360 mg Oral Daily  . feeding supplement (ENSURE ENLIVE)  237 mL Oral BID BM  . insulin aspart  0-5 Units Subcutaneous QHS  . insulin aspart  0-6 Units Subcutaneous TID WC  . insulin aspart  4 Units Subcutaneous TID WC  . insulin  detemir  14 Units Subcutaneous QHS  . insulin detemir  18 Units Subcutaneous Daily  . levETIRAcetam  250 mg Oral BID  . levothyroxine  50 mcg Oral QAC breakfast  . mouth rinse  15 mL Mouth Rinse BID  . metoprolol tartrate  100 mg Oral BID  . midodrine  5 mg Oral TID WC  . montelukast  10 mg Oral QHS  . multivitamin  1 tablet Oral QHS  . pantoprazole  40 mg Oral Daily  . predniSONE  10 mg Oral Q breakfast  . primidone  50 mg Oral Daily  . sodium chloride flush  10-40 mL Intracatheter Q12H  . traZODone  50 mg Oral QHS    Continuous Infusions: . sodium chloride 250 mL (03/17/20 1309)  . imipenem-cilastatin 500 mg (03/24/20 2220)    PRN Meds: acetaminophen, albuterol, calcium carbonate, diphenhydrAMINE, docusate sodium, EPINEPHrine, heparin, HYDROcodone-acetaminophen, metoprolol tartrate, nitroGLYCERIN, ondansetron (ZOFRAN) IV, Resource ThickenUp Clear, sodium chloride flush  Physical Exam  General: Alert, awake, in no acute distress.  HEENT: No bruits, no goiter, no JVD Heart: Regular rate and rhythm. No murmur appreciated. Lungs: Good air movement, clear Abdomen: Soft, nontender, nondistended, positive bowel sounds.  Skin: Warm and dry Neuro: Grossly intact, nonfocal.          Vital Signs: BP 119/77 (BP Location: Left Arm)   Pulse (!) 103   Temp 98.6 F (37 C) (Oral)   Resp 19   Ht 5\' 2"  (1.575 m)   Wt 88.9 kg   SpO2 98%   BMI 35.85 kg/m  SpO2: SpO2: 98 % O2 Device: O2 Device: Nasal Cannula O2 Flow Rate: O2 Flow Rate (L/min): 3 L/min  Intake/output summary:   Intake/Output Summary (Last 24 hours) at 03/25/2020 1205 Last data filed at 03/25/2020 1032 Gross per 24 hour  Intake 776.52 ml  Output 203 ml  Net 573.52 ml   LBM: Last BM Date:  03/23/20 Baseline Weight: Weight: 85.5 kg Most recent weight: Weight: 88.9 kg       Palliative Assessment/Data:    Flowsheet Rows     Most Recent Value  Intake Tab  Referral Department Hospitalist  Unit at Time of  Referral Cardiac/Telemetry Unit  Palliative Care Primary Diagnosis Cardiac  Date Notified 03/14/20  Palliative Care Type New Palliative care  Reason for referral End of Life Care Assistance  Date of Admission 03/10/20  Date first seen by Palliative Care 03/16/20  # of days Palliative referral response time 2 Day(s)  # of days IP prior to Palliative referral 4  Clinical Assessment  Psychosocial & Spiritual Assessment  Palliative Care Outcomes      Patient Active Problem List   Diagnosis Date Noted  . Goals of care, counseling/discussion   . DNR (do not resuscitate)   . Pleural effusion   . Palliative care by specialist   . Bacteremia 03/11/2020  . Atrial fibrillation with RVR ([place]) 03/10/2020  . Macrocytic anemia 03/10/2020  . Thrombocytopenia ([place]) 03/10/2020  . Hyperkalemia 03/10/2020  . Septic shock ([place]) 03/10/2020  . Elevated liver enzymes 03/10/2020  . Respiratory failure with hypoxia ([place]) 01/24/2020  . Hypoxia 01/23/2020  . Acute on chronic respiratory failure with hypoxia ([place]) 01/23/2020  . Persistent atrial fibrillation ([place])   . Cellulitis and abscess of right leg 11/23/2019  . ESRD (end stage renal disease) ([place]) 11/23/2019  . Elevated troponin   . Atrial fibrillation with rapid ventricular response ([place]) 11/22/2019  . Acute pulmonary edema (H[facility])   . Acute respiratory failure with hypoxia and hypercapnia ([place]) 09/10/2019  . SVT (supraventricular tachycardia) ([place]) 09/10/2019  . On mechanically assisted ventilation ([place]) 09/08/2019  . Acute metabolic encephalopathy 52/84/1324  . Volume overload 09/06/2019  . AKI (acute kidney injury) ([place]) 09/06/2019  . Altered mental state 11/28/2018  . Near syncope 11/28/2018  . Fall at home 11/28/2018  . History of seizure 11/28/2018  . 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 Pressure injury of skin 09/17/2018  . Respiratory failure ([place]) 08/16/2018  . Seizure ([place]) 08/16/2018  . Acute encephalopathy 07/27/2018   . Endotracheally intubated 07/27/2018  . Hypothyroidism 07/27/2018  . Pleuritic chest pain 07/14/2018  . Moderate persistent asthma without complication   . Type 2 diabetes mellitus ([place]) 04/19/2018  . Acute kidney injury superimposed on chronic kidney disease ([place]) 01/13/2018  . Altered mental status   . Encephalopathy 10/11/2016  . Chest pain 08/12/2016  . CKD (chronic kidney disease), stage IV ([place]) 08/12/2016  . History of pulmonary embolus (PE) 08/12/2016  . Obesity, Class III, BMI 40-49.9 (morbid obesity) ([place]) 08/12/2016  . RBBB 08/12/2016  . Positive D dimer 08/12/2016  . Cerebral infarction ([place]) 11/10/2015  . Physical deconditioning 07/01/2011  . Weakness 07/01/2011  . Diabetic foot ulcer ([place]) 05/19/2011  . Polypharmacy 02/09/2011  . BACK PAIN WITH RADICULOPATHY 05/25/2010  . Type 2 diabetes mellitus with diabetic polyneuropathy, without long-term current use of insulin ([place]) 08/18/2009  . Acute on chronic diastolic CHF (congestive heart failure) ([place]) 04/25/2009  . Normocytic anemia 04/14/2009  . Sleep apnea 12/11/2008  . Diabetic peripheral neuropathy ([place]) 05/04/2007  . HYPERCHOLESTEROLEMIA 10/13/2006  . Gout, unspecified 10/13/2006  . HYPOKALEMIA 10/13/2006  . Schizophrenia ([place]) 10/13/2006  . Depression 10/13/2006  . Essential hypertension 10/13/2006  . Venous (peripheral) insufficiency 10/13/2006  . RHINITIS, ALLERGIC 10/13/2006  . Asthma 10/13/2006  . Reflux esophagitis 10/13/2006  . OSTEOARTHRITIS, MULTI SITES 10/13/2006  . INCONTINENCE, URGE 10/13/2006  Palliative Care Assessment & Plan   Patient Profile: 69 year old female with past medical history of chronic respiratory failure secondary to COPD, A. fib, end-stage renal disease on hemodialysis, chronic diastolic heart failure who was admitted with septic shock.  She has been clearing consistent in desire to stop dialysis and I discussed this with her as well as her niece, [name], today.  She would  like to forego further dialysis and work to transition back to [facility] (where she is a long-term resident) with the support of hospice.  Goals of Care and Additional Recommendations:  I had a long discussion with [name] as well as her niece, [name].  We discussed options for care moving forward and [name] is clear in her desire to forego further dialysis.  I discussed with both her and her niece independently regarding expectations for clinical course moving forward as well as limited prognosis.  Patient is clear that she desires to transition back to [facility] where she is a long-term resident.  Recommended hospice follow her at skilled facility and she is agreeable.  I called and spoke with social work who reached out to [facility].  While [facility] is able to offer bed, there was some question about her eligibility for hospice services in regard to payer source.  I called and made referral to [place] to look at her case to see if she can receive hospice benefits while at her long-term bed at [facility].  I appreciate them reaching out to discuss further with [facility].  I am hopeful that she can transition back to [facility] with hospice support either today or tomorrow.  Plan for care moving forward is for her comfort.  I began to work to transition off of medications not related to her feeling better or they are not going to benefit her any longer with stopping dialysis.  This includes stopping antibiotics and regular glucose monitoring and correction.  I also made addition of medications for comfort as needed.  I would recommend these be continued on discharge.  Pain/shortness of breath: Oxycodone 5-10 mg p.o. every 3 hours as needed  Anxiety: Ativan 0.5 mg every 4 hours as needed  Agitation or nausea: Haldol 0.5 mg every 2 hours as needed  Excess secretions: Robinul while inpatient. Atropine drops sublingual every 3 hours as needed on  discharge.  Limitations on Scope of Treatment: Full Comfort Care  Code Status:    Code Status Orders  (From admission, onward)         Start     Ordered   03/21/20 1050  Do not attempt resuscitation (DNR)  Continuous       Question Answer Comment  In the event of cardiac or respiratory ARREST Do not call a "code blue"   In the event of cardiac or respiratory ARREST Do not perform Intubation, CPR, defibrillation or ACLS   In the event of cardiac or respiratory ARREST Use medication by any route, position, wound care, and other measures to relive pain and suffering. May use oxygen, suction and manual treatment of airway obstruction as needed for comfort.      03/21/20 1049        Code Status History    Date Active Date Inactive Code Status Order ID Comments User Context   03/10/2020 1230 03/21/2020 1049 Full Code [id number]  Mckinley Jewel, MD ED   01/23/2020 1928 01/26/2020 0003 Full Code [id number]  Vashti Hey, MD ED  11/22/2019 2141 11/27/2019 2110 Full Code [id number]  Bethena Roys, MD Inpatient   09/06/2019 0822 10/01/2019 2342 Full Code [id number]  Roxan Hockey, MD ED   11/28/2018 1635 11/29/2018 1904 Full Code [id number]  Murlean Iba, MD Inpatient   09/15/2018 1138 09/19/2018 1837 Full Code [id number]  Heath Lark D, DO ED   09/13/2018 0154 09/15/2018 1133 Full Code [id number]  Ripley Fraise, MD ED   08/16/2018 0603 08/20/2018 1453 Full Code [id number]  Aldean Jewett, MD Inpatient   07/27/2018 2032 08/01/2018 1538 Full Code [id number]  Arnell Asal, NP ED   07/14/2018 2209 07/15/2018 1634 Full Code [id number]  Vianne Bulls, MD ED   04/19/2018 0144 04/21/2018 1754 Full Code [id number]  Reubin Milan, MD ED   01/12/2018 1428 01/14/2018 0135 Full Code [id number]  Lorella Nimrod, MD ED   10/12/2016 0306 10/13/2016 1730 Full Code [id number]  Edwin Dada, MD Inpatient   08/12/2016 0128 08/13/2016 1809 Full Code [id number]  Lily Kocher, MD Inpatient    11/11/2015 0032 11/12/2015 1751 Full Code [id number]  Bethena Roys, MD Inpatient   Advance Care Planning Activity       Prognosis:   < 2 weeks-[name] desires to forego further dialysis.  We also discussed stopping other interventions that are not expressly for her benefit moving forward (such as antibiotics for her bacteremia).  Her expected prognosis in my opinion is likely days to less than 2 weeks at best.  Discharge Planning:  [place] with Hospice  Care plan was discussed with patient, niece, social work, hospice liaison  Thank you for allowing the Palliative Medicine Team to assist in the care of this patient.   Time In: 1140 Time Out: 1255 Total Time 75 Prolonged Time Billed Yes      Greater than 50%  of this time was spent counseling and coordinating care related to the above assessment and plan.  Micheline Rough, MD  Please contact Palliative Medicine Team phone at [phone] for questions and concerns.

## 2020-03-25 NOTE — Progress Notes (Signed)
   03/25/20 1014  Vitals  Temp 98.1 F (36.7 C)  Temp Source Oral  BP 133/82  BP Location Left Arm  BP Method Automatic  Pulse Rate (!) 103  Resp 19  Level of Consciousness  Level of Consciousness Alert  MEWS COLOR  MEWS Score Color Green  Pain Assessment  Pain Scale 0-10  Pain Score 10  Pain Type Chronic pain  Pain Location Leg  Pain Orientation Right;Left  Pain Radiating Towards leg  Pain Descriptors / Indicators Aching  Pain Frequency Intermittent  Glasgow Coma Scale  Eye Opening 4  Best Verbal Response (NON-intubated) 5  Best Motor Response 6  Glasgow Coma Scale Score 15  MEWS Score  MEWS Temp 0  MEWS Systolic 0  MEWS Pulse 1  MEWS RR 0  MEWS LOC 0  MEWS Score 1  Note  Observations alert  02 sensor was off and HR increased none sustained ti 119 recheck vitals and gave prn for pain.

## 2020-03-25 NOTE — Progress Notes (Addendum)
Pt has agreed to dialysis to help her breath better Called Hemo  back to make aware that patient has agrees. Stated it will be noon time for treatment.

## 2020-03-25 NOTE — Progress Notes (Signed)
Pt. Refused dialysis upon transport

## 2020-03-26 DIAGNOSIS — Z7189 Other specified counseling: Secondary | ICD-10-CM | POA: Diagnosis not present

## 2020-03-26 DIAGNOSIS — N186 End stage renal disease: Secondary | ICD-10-CM | POA: Diagnosis not present

## 2020-03-26 DIAGNOSIS — Z515 Encounter for palliative care: Secondary | ICD-10-CM | POA: Diagnosis not present

## 2020-03-26 DIAGNOSIS — E1142 Type 2 diabetes mellitus with diabetic polyneuropathy: Secondary | ICD-10-CM | POA: Diagnosis not present

## 2020-03-26 DIAGNOSIS — R7881 Bacteremia: Secondary | ICD-10-CM | POA: Diagnosis not present

## 2020-03-26 DIAGNOSIS — I5033 Acute on chronic diastolic (congestive) heart failure: Secondary | ICD-10-CM | POA: Diagnosis not present

## 2020-03-26 MED ORDER — AMIODARONE HCL 200 MG PO TABS: 200.0000 mg | ORAL_TABLET | Freq: Two times a day (BID) | ORAL | 0 refills | Status: AC

## 2020-03-26 MED ORDER — METOPROLOL TARTRATE 100 MG PO TABS: 100.0000 mg | ORAL_TABLET | Freq: Two times a day (BID) | ORAL | 0 refills | Status: AC

## 2020-03-26 MED ORDER — OXYCODONE HCL 5 MG PO TABS: 5.0000 mg | ORAL_TABLET | Freq: Four times a day (QID) | ORAL | 0 refills | Status: AC | PRN

## 2020-03-26 MED ORDER — INSULIN DETEMIR 100 UNIT/ML FLEXPEN: PEN_INJECTOR | SUBCUTANEOUS | 0 refills | Status: AC

## 2020-03-26 MED ORDER — LORAZEPAM 0.5 MG PO TABS: 0.5000 mg | ORAL_TABLET | ORAL | 0 refills | Status: AC | PRN

## 2020-03-26 MED ORDER — MIDODRINE HCL 5 MG PO TABS: 5.0000 mg | ORAL_TABLET | Freq: Three times a day (TID) | ORAL | 0 refills | Status: AC

## 2020-03-26 NOTE — Progress Notes (Signed)
Daily Progress Note   Patient Name: [name]       Date: 03/26/2020 DOB: Dec 26, 1950  Age: 69 y.o. MRN#: [id number] Attending Physician: Donne Hazel, MD Primary Care Physician: Glenda Chroman, MD Admit Date: 03/10/2020  Reason for Consultation/Follow-up: Establishing goals of care  Subjective: I saw and examined Ms. Zepeda today.  She was awake and alert and lying in bed in no distress.  She seems a little bit slower to answer questions today, but still seems to have clear understanding of situation.  We reviewed again plan to forego further dialysis and transition back to [facility] with a goal for comfort moving forward.  She expressed to me today that she just wants to go to [facility], see her friend Judson Roch, and do word searches for whatever time she has left.  I called to confirm with social work who is working on plan to transition back to [facility].  Currently, awaiting to see if she will be able to receive hospice services at [facility].  Length of Stay: 16  Current Medications: Scheduled Meds:  . amiodarone  200 mg Oral BID  . ARIPiprazole  2 mg Oral Daily  . aspirin EC  81 mg Oral Q breakfast  . buPROPion  150 mg Oral Daily  . Chlorhexidine Gluconate Cloth  6 each Topical Daily  . diltiazem  360 mg Oral Daily  . feeding supplement (ENSURE ENLIVE)  237 mL Oral BID BM  . insulin detemir  14 Units Subcutaneous QHS  . insulin detemir  18 Units Subcutaneous Daily  . levETIRAcetam  250 mg Oral BID  . levothyroxine  50 mcg Oral QAC breakfast  . mouth rinse  15 mL Mouth Rinse BID  . metoprolol tartrate  100 mg Oral BID  . midodrine  5 mg Oral TID WC  . montelukast  10 mg Oral QHS  . pantoprazole  40 mg Oral Daily  . predniSONE  10 mg Oral Q breakfast  .  primidone  50 mg Oral Daily  . sodium chloride flush  10-40 mL Intracatheter Q12H  . traZODone  50 mg Oral QHS    Continuous Infusions: . sodium chloride 250 mL (03/17/20 1309)    PRN Meds: acetaminophen, albuterol, antiseptic oral rinse, docusate sodium, glycopyrrolate **OR** glycopyrrolate **OR** glycopyrrolate, haloperidol **OR**  haloperidol **OR** haloperidol lactate, LORazepam **OR** LORazepam **OR** LORazepam, nitroGLYCERIN, ondansetron (ZOFRAN) IV, oxyCODONE, polyvinyl alcohol, Resource ThickenUp Clear, sodium chloride flush  Physical Exam  General: Alert, awake, in no acute distress.  Heart: Regular rate and rhythm. No murmur appreciated. Lungs: Fair air movement, clear Abdomen: Soft, positive bowel sounds.  Skin: Warm and dry      Vital Signs: BP 126/81 (BP Location: Left Arm)   Pulse (!) 102   Temp 98 F (36.7 C) (Oral)   Resp 19   Ht 5\' 2"  (1.575 m)   Wt 88.9 kg   SpO2 98%   BMI 35.85 kg/m  SpO2: SpO2: 98 % O2 Device: O2 Device: Nasal Cannula O2 Flow Rate: O2 Flow Rate (L/min): 3 L/min  Intake/output summary:   Intake/Output Summary (Last 24 hours) at 03/26/2020 1241 Last data filed at 03/26/2020 0900 Gross per 24 hour  Intake 840 ml  Output 260 ml  Net 580 ml   LBM: Last BM Date: 03/23/20 Baseline Weight: Weight: 85.5 kg Most recent weight: Weight: 88.9 kg  Flowsheet Rows     Most Recent Value  Intake Tab  Referral Department Hospitalist  Unit at Time of Referral Cardiac/Telemetry Unit  Palliative Care Primary Diagnosis Cardiac  Date Notified 03/14/20  Palliative Care Type New Palliative care  Reason for referral End of Life Care Assistance  Date of Admission 03/10/20  Date first seen by Palliative Care 03/16/20  # of days Palliative referral response time 2 Day(s)  # of days IP prior to Palliative referral 4  Clinical Assessment  Psychosocial & Spiritual Assessment  Palliative Care Outcomes      Patient Active Problem List   Diagnosis  Date Noted  . Goals of care, counseling/discussion   . DNR (do not resuscitate)   . Pleural effusion   . Palliative care by specialist   . Bacteremia 03/11/2020  . Atrial fibrillation with RVR ([place]) 03/10/2020  . Macrocytic anemia 03/10/2020  . Thrombocytopenia ([place]) 03/10/2020  . Hyperkalemia 03/10/2020  . Septic shock ([place]) 03/10/2020  . Elevated liver enzymes 03/10/2020  . Respiratory failure with hypoxia ([place]) 01/24/2020  . Hypoxia 01/23/2020  . Acute on chronic respiratory failure with hypoxia ([place]) 01/23/2020  . Persistent atrial fibrillation ([place])   . Cellulitis and abscess of right leg 11/23/2019  . ESRD (end stage renal disease) ([place]) 11/23/2019  . Elevated troponin   . Atrial fibrillation with rapid ventricular response ([place]) 11/22/2019  . Acute pulmonary edema (HCC)   . Acute respiratory failure with hypoxia and hypercapnia ([place]) 09/10/2019  . SVT (supraventricular tachycardia) ([place]) 09/10/2019  . On mechanically assisted ventilation ([place]) 09/08/2019  . Acute metabolic encephalopathy 16/08/930  . Volume overload 09/06/2019  . AKI (acute kidney injury) ([place]) 09/06/2019  . Altered mental state 11/28/2018  . Near syncope 11/28/2018  . Fall at home 11/28/2018  . History of seizure 11/28/2018  . 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 Pressure injury of skin 09/17/2018  . Respiratory failure ([place]) 08/16/2018  . Seizure ([place]) 08/16/2018  . Acute encephalopathy 07/27/2018  . Endotracheally intubated 07/27/2018  . Hypothyroidism 07/27/2018  . Pleuritic chest pain 07/14/2018  . Moderate persistent asthma without complication   . Type 2 diabetes mellitus ([place]) 04/19/2018  . Acute kidney injury superimposed on chronic kidney disease ([place]) 01/13/2018  . Altered mental status   . Encephalopathy 10/11/2016  . Chest pain 08/12/2016  . CKD (chronic kidney disease), stage IV ([place]) 08/12/2016  . History of pulmonary embolus (PE) 08/12/2016  .  Obesity, Class III, BMI  40-49.9 (morbid obesity) ([place]) 08/12/2016  . RBBB 08/12/2016  . Positive D dimer 08/12/2016  . Cerebral infarction ([place]) 11/10/2015  . Physical deconditioning 07/01/2011  . Weakness 07/01/2011  . Diabetic foot ulcer ([place]) 05/19/2011  . Polypharmacy 02/09/2011  . BACK PAIN WITH RADICULOPATHY 05/25/2010  . Type 2 diabetes mellitus with diabetic polyneuropathy, without long-term current use of insulin ([place]) 08/18/2009  . Acute on chronic diastolic CHF (congestive heart failure) ([place]) 04/25/2009  . Normocytic anemia 04/14/2009  . Sleep apnea 12/11/2008  . Diabetic peripheral neuropathy ([place]) 05/04/2007  . HYPERCHOLESTEROLEMIA 10/13/2006  . Gout, unspecified 10/13/2006  . HYPOKALEMIA 10/13/2006  . Schizophrenia ([place]) 10/13/2006  . Depression 10/13/2006  . Essential hypertension 10/13/2006  . Venous (peripheral) insufficiency 10/13/2006  . RHINITIS, ALLERGIC 10/13/2006  . Asthma 10/13/2006  . Reflux esophagitis 10/13/2006  . OSTEOARTHRITIS, MULTI SITES 10/13/2006  . INCONTINENCE, URGE 10/13/2006    Palliative Care Assessment & Plan   Patient Profile: 69 year old female with past medical history of chronic respiratory failure secondary to COPD, A. fib, end-stage renal disease on hemodialysis, chronic diastolic heart failure who was admitted with septic shock.  She has been clearing consistent in desire to stop dialysis and I discussed this with her as well as her niece, Helene Kelp, today.  She would like to forego further dialysis and work to transition back to [facility] (where she is a long-term resident) with the support of hospice.  Goals of Care and Additional Recommendations:  Ms. Weightman is a long-term resident at [facility] and considers this to be her home.  She stated again today that she wishes to plan is for transition back to [facility] for end-of-life care.  She would certainly best be served by transition back to [facility] with hospice support if it can be arranged.   If, due to payer source issues, she cannot transition back to [facility] with hospice support, I would still support her going back to [facility] as this is her stated goal as long as they can effectively manage her symptoms at end-of-life.  Otherwise, we may need to readdress possibility of residential hospice.  Again, her desire is to go to [facility] so recommend to explore this avenue fully prior to assessing for residential hospice.  Plan for care moving forward is for her comfort.  Continue to transition off of medications not related to her feeling better as they are not going to benefit her any longer with stopping dialysis.  I did leave some medications in place that can be stopped on discharge as my goal was to keep her as lucid as possible for as long as possible to be able to get back to [facility] so she can say goodbye to her friends.   I also made addition of medications for comfort as needed.  I would recommend these be continued on discharge.  Pain/shortness of breath: Oxycodone 5-10 mg p.o. every 3 hours as needed  Anxiety: Ativan 0.5 mg every 4 hours as needed  Agitation or nausea: Haldol 0.5 mg every 2 hours as needed  Excess secretions: Robinul while inpatient. Atropine drops sublingual every 3 hours as needed on discharge.  Greatly appreciate LCSW assistance in getting her back to [facility].  Code Status:    Code Status Orders  (From admission, onward)         Start     Ordered   03/21/20 1050  Do not attempt resuscitation (DNR)  Continuous  Question Answer Comment  In the event of cardiac or respiratory ARREST Do not call a "code blue"   In the event of cardiac or respiratory ARREST Do not perform Intubation, CPR, defibrillation or ACLS   In the event of cardiac or respiratory ARREST Use medication by any route, position, wound care, and other measures to relive pain and suffering. May use oxygen, suction and manual treatment of airway obstruction  as needed for comfort.      03/21/20 1049        Code Status History    Date Active Date Inactive Code Status Order ID Comments User Context   03/10/2020 1230 03/21/2020 1049 Full Code [id number]  Mckinley Jewel, MD ED   01/23/2020 1928 01/26/2020 0003 Full Code [id number]  Vashti Hey, MD ED   11/22/2019 2141 11/27/2019 2110 Full Code [id number]  Bethena Roys, MD Inpatient   09/06/2019 0822 10/01/2019 2342 Full Code [id number]  Roxan Hockey, MD ED   11/28/2018 1635 11/29/2018 1904 Full Code [id number]  Murlean Iba, MD Inpatient   09/15/2018 1138 09/19/2018 1837 Full Code [id number]  Heath Lark D, DO ED   09/13/2018 0154 09/15/2018 1133 Full Code [id number]  Ripley Fraise, MD ED   08/16/2018 0603 08/20/2018 1453 Full Code [id number]  Aldean Jewett, MD Inpatient   07/27/2018 2032 08/01/2018 1538 Full Code [id number]  Arnell Asal, NP ED   07/14/2018 2209 07/15/2018 1634 Full Code [id number]  Vianne Bulls, MD ED   04/19/2018 0144 04/21/2018 1754 Full Code [id number]  Reubin Milan, MD ED   01/12/2018 1428 01/14/2018 0135 Full Code [id number]  Lorella Nimrod, MD ED   10/12/2016 0306 10/13/2016 1730 Full Code [id number]  Edwin Dada, MD Inpatient   08/12/2016 0128 08/13/2016 1809 Full Code [id number]  Lily Kocher, MD Inpatient   11/11/2015 0032 11/12/2015 1751 Full Code [id number]  Bethena Roys, MD Inpatient   Advance Care Planning Activity       Prognosis:   < 2 weeks-Ms. Hill desires to forego further dialysis and we have stopped treatment for her bacteremia as goal moving forward is for comfort.  Her expected prognosis in my opinion is likely days to less than 2 weeks at best.  Discharge Planning:  [place] with Hospice  Care plan was discussed with Dr. Wyline Copas, social work Thank you for allowing the Palliative Medicine Team to assist in the care of this patient.   Time In: 1200 Time Out: 1220 Total Time 20 Prolonged Time Billed No     Greater than 50%  of this time was spent counseling and coordinating care related to the above assessment and plan.   Micheline Rough, MD  Please contact Palliative Medicine Team phone at [phone] for questions and concerns.

## 2020-03-26 NOTE — NC FL2 (Signed)
[place] LEVEL OF CARE SCREENING TOOL     IDENTIFICATION  Patient Name: [name] Birthdate: 12/31/1950 Sex: female Admission Date (Current Location): 03/10/2020  [facility] and [place] Number:  Herbalist and Address:  [facility], [place] [address], [place], [place] [address]      Provider Number: [id number]  Attending Physician Name and Address:  Donne Hazel, MD  Relative Name and Phone Number:  Helene Kelp niece, [phone]    Current Level of Care: Hospital Recommended Level of Care: [place] Prior Approval Number:    Date Approved/Denied:   PASRR Number: [id number]  Discharge Plan: SNF    Current Diagnoses: Patient Active Problem List   Diagnosis Date Noted  . Goals of care, counseling/discussion   . DNR (do not resuscitate)   . Pleural effusion   . Palliative care by specialist   . Bacteremia 03/11/2020  . Atrial fibrillation with RVR ([place]) 03/10/2020  . Macrocytic anemia 03/10/2020  . Thrombocytopenia ([place]) 03/10/2020  . Hyperkalemia 03/10/2020  . Septic shock ([place]) 03/10/2020  . Elevated liver enzymes 03/10/2020  . Respiratory failure with hypoxia ([place]) 01/24/2020  . Hypoxia 01/23/2020  . Acute on chronic respiratory failure with hypoxia ([place]) 01/23/2020  . Persistent atrial fibrillation ([place])   . Cellulitis and abscess of right leg 11/23/2019  . ESRD (end stage renal disease) ([place]) 11/23/2019  . Elevated troponin   . Atrial fibrillation with rapid ventricular response ([place]) 11/22/2019  . Acute pulmonary edema (HCC)   . Acute respiratory failure with hypoxia and hypercapnia ([place]) 09/10/2019  . SVT (supraventricular tachycardia) ([place]) 09/10/2019  . On mechanically assisted ventilation ([place]) 09/08/2019  . Acute metabolic encephalopathy 16/38/4665  . Volume overload 09/06/2019  . AKI (acute kidney injury) ([place]) 09/06/2019  . Altered mental state 11/28/2018  . Near syncope 11/28/2018   . Fall at home 11/28/2018  . History of seizure 11/28/2018  . 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 Pressure injury of skin 09/17/2018  . Respiratory failure ([place]) 08/16/2018  . Seizure ([place]) 08/16/2018  . Acute encephalopathy 07/27/2018  . Endotracheally intubated 07/27/2018  . Hypothyroidism 07/27/2018  . Pleuritic chest pain 07/14/2018  . Moderate persistent asthma without complication   . Type 2 diabetes mellitus ([place]) 04/19/2018  . Acute kidney injury superimposed on chronic kidney disease ([place]) 01/13/2018  . Altered mental status   . Encephalopathy 10/11/2016  . Chest pain 08/12/2016  . CKD (chronic kidney disease), stage IV ([place]) 08/12/2016  . History of pulmonary embolus (PE) 08/12/2016  . Obesity, Class III, BMI 40-49.9 (morbid obesity) ([place]) 08/12/2016  . RBBB 08/12/2016  . Positive D dimer 08/12/2016  . Cerebral infarction ([place]) 11/10/2015  . Physical deconditioning 07/01/2011  . Weakness 07/01/2011  . Diabetic foot ulcer ([place]) 05/19/2011  . Polypharmacy 02/09/2011  . BACK PAIN WITH RADICULOPATHY 05/25/2010  . Type 2 diabetes mellitus with diabetic polyneuropathy, without long-term current use of insulin ([place]) 08/18/2009  . Acute on chronic diastolic CHF (congestive heart failure) ([place]) 04/25/2009  . Normocytic anemia 04/14/2009  . Sleep apnea 12/11/2008  . Diabetic peripheral neuropathy ([place]) 05/04/2007  . HYPERCHOLESTEROLEMIA 10/13/2006  . Gout, unspecified 10/13/2006  . HYPOKALEMIA 10/13/2006  . Schizophrenia ([place]) 10/13/2006  . Depression 10/13/2006  . Essential hypertension 10/13/2006  . Venous (peripheral) insufficiency 10/13/2006  . RHINITIS, ALLERGIC 10/13/2006  . Asthma 10/13/2006  . Reflux esophagitis 10/13/2006  . OSTEOARTHRITIS, MULTI SITES 10/13/2006  . INCONTINENCE, URGE 10/13/2006  Orientation RESPIRATION BLADDER Height & Weight     Self, Time, Place  O2 (3L Nasal cannula) Incontinent Weight: 195 lb 15.8 oz (88.9 kg) Height:   5\' 2"  (157.5 cm)  BEHAVIORAL SYMPTOMS/MOOD NEUROLOGICAL BOWEL NUTRITION STATUS      Incontinent Diet (See Discharge Summary)  AMBULATORY STATUS COMMUNICATION OF NEEDS Skin   Extensive Assist Verbally Surgical wounds, Other (Comment), PU Stage and Appropriate Care (Unstageable PU L Heel; unstageable PU R heel;Closed incision R arm; Ecchymosis generalized;MASD groin/abdomen;Cracking on heels)       PU Stage 4 Dressing:  (Unstageable PU Billateral with foam to be changed every 3 days)               Personal Care Assistance Level of Assistance  Bathing, Feeding, Dressing Bathing Assistance: Maximum assistance Feeding assistance: Limited assistance Dressing Assistance: Maximum assistance     Functional Limitations Info  Sight, Hearing, Speech Sight Info: Adequate Hearing Info: Adequate Speech Info: Adequate    SPECIAL CARE FACTORS FREQUENCY   (Hospice)     PT Frequency: 5x min weekly OT Frequency: 5x min weekly            Contractures Contractures Info: Not present    Additional Factors Info  Code Status, Allergies, Insulin Sliding Scale, Isolation Precautions Code Status Info: FULL Allergies Info: Benztropine Mesylate,Codeine,Diazepam,Diphenhydramine Hcl,Penicillins,Sulfonamide Derivatives,Thioridazine Hcl,Bee Venom,Lisinopril,   Insulin Sliding Scale Info: insulin aspart (novoLOG) injection 0-15 Units 3 times daily with meals,insulin aspart (novoLOG) injection 0-5 Units Daily at bedtime Isolation Precautions Info: ESBL; VRE     Current Medications (03/26/2020):  This is the current hospital active medication list Current Facility-Administered Medications  Medication Dose Route Frequency Provider Last Rate Last Admin  . 0.9 %  sodium chloride infusion  250 mL Intravenous Continuous Rigoberto Noel, MD 10 mL/hr at 03/17/20 1309 250 mL at 03/17/20 1309  . acetaminophen (TYLENOL) tablet 650 mg  650 mg Oral Q4H PRN Pahwani, Rinka R, MD      . albuterol (PROVENTIL) (2.5  MG/3ML) 0.083% nebulizer solution 2.3333 mg  2.3333 mg Inhalation Q6H PRN Pahwani, Rinka R, MD      . amiodarone (PACERONE) tablet 200 mg  200 mg Oral BID Ledora Bottcher, PA   200 mg at 03/26/20 0804  . antiseptic oral rinse (BIOTENE) solution 15 mL  15 mL Topical PRN Domingo Cocking, Gene, MD      . ARIPiprazole (ABILIFY) tablet 2 mg  2 mg Oral Daily Pahwani, Rinka R, MD   2 mg at 03/26/20 0920  . aspirin EC tablet 81 mg  81 mg Oral Q breakfast Pahwani, Rinka R, MD   81 mg at 03/26/20 0804  . buPROPion (WELLBUTRIN XL) 24 hr tablet 150 mg  150 mg Oral Daily Tommy Medal, Lavell Islam, MD   150 mg at 03/26/20 0804  . Chlorhexidine Gluconate Cloth 2 % PADS 6 each  6 each Topical Daily Pahwani, Rinka R, MD   6 each at 03/23/20 1026  . diltiazem (CARDIZEM CD) 24 hr capsule 360 mg  360 mg Oral Daily Ledora Bottcher, [place]   360 mg at 03/26/20 0805  . docusate sodium (COLACE) capsule 100 mg  100 mg Oral Daily PRN Rigoberto Noel, MD   100 mg at 03/11/20 1510  . feeding supplement (ENSURE ENLIVE) (ENSURE ENLIVE) liquid 237 mL  237 mL Oral BID BM Kyle, Tyrone A, DO   237 mL at 03/25/20 0910  . glycopyrrolate (ROBINUL) tablet 1 mg  1 mg Oral Q4H  PRN Micheline Rough, MD       Or  . glycopyrrolate (ROBINUL) injection 0.2 mg  0.2 mg Subcutaneous Q4H PRN Micheline Rough, MD       Or  . glycopyrrolate (ROBINUL) injection 0.2 mg  0.2 mg Intravenous Q4H PRN Domingo Cocking, Gene, MD      . haloperidol (HALDOL) tablet 0.5 mg  0.5 mg Oral Q4H PRN Micheline Rough, MD       Or  . haloperidol (HALDOL) 2 MG/ML solution 0.5 mg  0.5 mg Sublingual Q4H PRN Micheline Rough, MD       Or  . haloperidol lactate (HALDOL) injection 0.5 mg  0.5 mg Intravenous Q4H PRN Domingo Cocking, Gene, MD      . insulin detemir (LEVEMIR) injection 14 Units  14 Units Subcutaneous QHS Kyle, Tyrone A, DO   14 Units at 03/25/20 2158  . insulin detemir (LEVEMIR) injection 18 Units  18 Units Subcutaneous Daily Kyle, Tyrone A, DO   18 Units at 03/26/20 0920  . levETIRAcetam  (KEPPRA) tablet 250 mg  250 mg Oral BID Pahwani, Rinka R, MD   250 mg at 03/26/20 0805  . levothyroxine (SYNTHROID) tablet 50 mcg  50 mcg Oral QAC breakfast Pahwani, Rinka R, MD   50 mcg at 03/26/20 0614  . LORazepam (ATIVAN) tablet 0.5 mg  0.5 mg Oral Q4H PRN Micheline Rough, MD   0.5 mg at 03/26/20 0919   Or  . LORazepam (ATIVAN) 2 MG/ML concentrated solution 0.5 mg  0.5 mg Sublingual Q4H PRN Micheline Rough, MD       Or  . LORazepam (ATIVAN) injection 0.5 mg  0.5 mg Intravenous Q4H PRN Micheline Rough, MD      . MEDLINE mouth rinse  15 mL Mouth Rinse BID Rigoberto Noel, MD   15 mL at 03/26/20 0807  . metoprolol tartrate (LOPRESSOR) tablet 100 mg  100 mg Oral BID Charlynne Cousins, MD   100 mg at 03/26/20 0805  . midodrine (PROAMATINE) tablet 5 mg  5 mg Oral TID WC Arnoldo Lenis, MD   5 mg at 03/26/20 6629  . montelukast (SINGULAIR) tablet 10 mg  10 mg Oral QHS Pahwani, Rinka R, MD   10 mg at 03/25/20 2150  . nitroGLYCERIN (NITROSTAT) SL tablet 0.4 mg  0.4 mg Sublingual Q5 Min x 3 PRN Pahwani, Rinka R, MD      . ondansetron (ZOFRAN) injection 4 mg  4 mg Intravenous Q6H PRN Pahwani, Rinka R, MD   4 mg at 03/24/20 1102  . oxyCODONE (Oxy IR/ROXICODONE) immediate release tablet 5-10 mg  5-10 mg Oral Q3H PRN Micheline Rough, MD   5 mg at 03/25/20 2150  . pantoprazole (PROTONIX) EC tablet 40 mg  40 mg Oral Daily Pahwani, Rinka R, MD   40 mg at 03/26/20 0805  . polyvinyl alcohol (LIQUIFILM TEARS) 1.4 % ophthalmic solution 1 drop  1 drop Both Eyes QID PRN Micheline Rough, MD      . predniSONE (DELTASONE) tablet 10 mg  10 mg Oral Q breakfast Tommy Medal, Lavell Islam, MD   10 mg at 03/26/20 0805  . primidone (MYSOLINE) tablet 50 mg  50 mg Oral Daily Rigoberto Noel, MD   50 mg at 03/26/20 0919  . Resource ThickenUp Clear   Oral PRN Charlynne Cousins, MD      . sodium chloride flush (NS) 0.9 % injection 10-40 mL  10-40 mL Intracatheter Q12H Desai, Rahul P, PA-C   10 mL at 03/26/20  1660  . sodium chloride  flush (NS) 0.9 % injection 10-40 mL  10-40 mL Intracatheter PRN Shearon Stalls, Rahul P, PA-C   10 mL at 03/18/20 2055  . traZODone (DESYREL) tablet 50 mg  50 mg Oral QHS Tommy Medal, Lavell Islam, MD   50 mg at 03/25/20 2150     Discharge Medications: Please see discharge summary for a list of discharge medications.  Relevant Imaging Results:  Relevant Lab Results:   Additional Information [phone]. Add Hospice.  Benard Halsted, LCSW

## 2020-03-26 NOTE — TOC Transition Note (Addendum)
Transition of Care [facility]) - CM/SW Discharge Note   Patient Details  Name: [name] MRN: [id number] Date of Birth: 1951-01-09  Transition of Care [facility]) CM/SW Contact:  Benard Halsted, LCSW Phone Number: 03/26/2020, 3:50 PM   Clinical Narrative:    Patient will DC to: [place] Anticipated DC date: 03/26/20 Family notified: Niece, Copy by: Corey Harold (~7pm)   Per MD patient ready for DC to [facility]. RN, patient, patient's family, and facility notified of DC. Discharge Summary and FL2 sent to facility. They will arrange Hospice services. RN to call report prior to discharge [phone]). DC packet on chart. Ambulance transport requested for patient.   CSW will sign off for now as social work intervention is no longer needed. Please consult [place] again if new needs arise.      Final next level of care: Skilled Nursing Facility Barriers to Discharge: Barriers Resolved   Patient Goals and CMS Choice Patient states their goals for this hospitalization and ongoing recovery are:: to go to SNF CMS Medicare.gov Compare Post Acute Care list provided to:: Patient Choice offered to / list presented to : Patient  Discharge Placement   Existing PASRR number confirmed : 03/26/20          Patient chooses bed at: [facility] Patient to be transferred to facility by: [place] Name of family member notified: Helene Kelp, Niece Patient and family notified of of transfer: 03/26/20  Discharge Plan and Services In-house Referral: Clinical Social Work, Hospice / Palliative Care                                   Social Determinants of Health (SDOH) Interventions     Readmission Risk Interventions Readmission Risk Prevention Plan 03/11/2020 09/10/2019 11/29/2018  Transportation Screening Complete Complete Complete  Medication Review Press photographer) - Complete Complete  PCP or Specialist appointment within 3-5 days of discharge - - Not Complete  PCP/Specialist Appt  Not Complete comments - - earliest appt available was 12/08/18  [place] or Home Care Consult - - Complete  SW Recovery Care/Counseling Consult Complete - Complete  Palliative Care Screening - - Not Applicable  Skilled Nursing Facility Complete - Not Applicable  Some recent data might be hidden

## 2020-03-26 NOTE — Discharge Summary (Signed)
Physician Discharge Summary  [name] ERD:[id number] DOB: 1951/06/06 DOA: 03/10/2020  PCP: Glenda Chroman, MD  Admit date: 03/10/2020 Discharge date: 03/26/2020  Admitted From: SNF Disposition:  SNF with palliative care  Recommendations for Outpatient Follow-up:  1. Follow up with PCP in 1-2 weeks 2. Please have Palliative Care follow patient at facility for end of life  NCSSR reviewed. No recent controlled substances are listed. Limited quantity of opiates and benzos are prescribed for palliative purposes  Discharge Condition:Stable CODE STATUS:DNR Diet recommendation: Diabetic   Brief/Interim Summary: 69 y.o.femalepast medical history of chronic respiratory failure with hypoxia secondary to underlying COPD on 3 L of oxygen, A. fib on Eliquis end-stage renal disease on hemodialysis chronic diastolic heart failure moderate AS, who came into the hospital for hyperglycemia and tachycardia was found in A. fib with RVR, decompensated systolic heart failure and septic shock who presented to the ED with atrial fibrillation and RVR with a heart rate of 170 was started on a diltiazem drip on arrival to the ED heart rate improved however she developed progressive hypotension and was transitioned to amiodarone due to hypotension PCCM was consulted and started on pressors for septic shock due to right lower lobe pneumonia.  8/10: Spoke with neprho. Pt informed him that she no longer wanted to continue HD sessions. Nursing also reports that pt has refused HD sessions. I spoke with the pt's HCPOA and informed her of the situation and what stopping HD means. The HCPOA is in agreement with the pt and in agreement with proceeding to hospice. I have notified PC and nephrology of my conversation with the [place]. We will get her transitioned to hospice.   Discharge Diagnoses:  Principal Problem:   Bacteremia Active Problems:   Type 2 diabetes mellitus with diabetic polyneuropathy, without long-term  current use of insulin (HCC)   Diabetic peripheral neuropathy (HCC)   HYPERCHOLESTEROLEMIA   Gout, unspecified   Schizophrenia ([place])   Essential hypertension   Acute on chronic diastolic CHF (congestive heart failure) (HCC)   Hypothyroidism   ESRD (end stage renal disease) (HCC)   Elevated troponin   Acute on chronic respiratory failure with hypoxia (HCC)   Atrial fibrillation with RVR (HCC)   Macrocytic anemia   Thrombocytopenia (HCC)   Hyperkalemia   Septic shock (HCC)   Elevated liver enzymes   Palliative care by specialist   Pleural effusion   Goals of care, counseling/discussion   DNR (do not resuscitate)  Septic shock due to Enterococcus faecalis bacteremia likely secondary to her HD catheter E coli bacteremia - ID was consulted he was sensitive to Vanco, then switch to Zyvox, then ampicillin now on imipenem due to blood cultures growing ESBL E. coli. - Central line discontinued on 03/16/2020. - Tunnel catheter successfully removed on 03/13/2020. - Bld Cx on 03/10/2020 for Enterococcus. - Bld Cx cultures on 03/17/2020 show ESBL E. Coli - Bld Cx 8/4 arenegative - Primaxin was recommended through 8/17; rpt Bld Cx on 9/1. She is stable     - 8/10: see 'ESRD on HD' below. Pt now comfort measures   ESRD on HD - per nephrology     - 8/10: Pt has notified nephrology and nursing that she no longer wants HD sessions. Spoke with HCPOA today and explained what this means for her overall prognosis. HCPOA notes that her aunt seems miserable and has voiced a lack of motivation to continue. HCPOA agrees with hospice. Palliative Care had been following with plan for return to  facility with Palliative Care referral at facility.   Persistent atrial fibrillation with RVR: - Secondary to infectious bacteremia. - Due to heart control heart rate EP was consulted recommended to continue amiodarone, metoprolol and diltiazem -Given transition to comfort,  eliquis was discontinued  N/V - PRN anti-emetics - KUB negative -she has intermittent N  Acute on chronic respiratory failure with hypoxia acute decompensated diastolic heart failure Pleural effusion - on baseline O2 use - volume control w/ HD - w/ baseline O2 use and small effusion, no further w/u at this time  Bleeding subclavian line Thrombocytopenia - s/p 1 unit of platelets and 2 units of fresh frozen plasma - Eliquis was held - now focus on comfort  DM2 -Insulin dependent, continued on scheduled levemir  Discharge Instructions   Allergies as of 03/26/2020      Reactions   Benztropine Mesylate Other (See Comments)   Alopecia and white spots on eyes   Codeine Swelling, Palpitations, Other (See Comments)   Mouth Swelling and Tachycardia   Diazepam Other (See Comments)   COMA   Diphenhydramine Hcl Anxiety, Palpitations, Other (See Comments)   Tachycardia and anxiousness   Sulfonamide Derivatives Rash   Blisters, also   Thioridazine Hcl Swelling   Bee Venom    Documented on MAR   Lisinopril Cough      Medication List    STOP taking these medications   AMINO ACID PROTEIN PO   apixaban 5 MG Tabs tablet Commonly known as: ELIQUIS   atorvastatin 20 MG tablet Commonly known as: LIPITOR   calcitRIOL 0.25 MCG capsule Commonly known as: ROCALTROL   Calcium Acetate 667 MG Tabs   calcium carbonate 500 MG chewable tablet Commonly known as: TUMS - dosed in mg elemental calcium   DAILY VITE PO   DULoxetine 60 MG capsule Commonly known as: CYMBALTA   gabapentin 100 MG capsule Commonly known as: NEURONTIN   metoprolol 200 MG 24 hr tablet Commonly known as: TOPROL-XL   pyridoxine 200 MG tablet Commonly known as: B-6   senna 8.6 MG tablet Commonly known as: SENOKOT   traMADol 50 MG tablet Commonly known as: Ultram   Trulicity 1.5 YN/8.2NF Sopn Generic drug: Dulaglutide   Vitamin D3 50 MCG (2000 UT) Tabs      TAKE these medications   acetaminophen 500 MG tablet Commonly known as: TYLENOL Take 500 mg by mouth 2 (two) times daily. At 0800 and 2000   albuterol 108 (90 Base) MCG/ACT inhaler Commonly known as: VENTOLIN HFA Inhale 1 puff into the lungs every 6 (six) hours as needed for wheezing or shortness of breath.   amiodarone 200 MG tablet Commonly known as: PACERONE Take 1 tablet (200 mg total) by mouth 2 (two) times daily.   ARIPiprazole 2 MG tablet Commonly known as: ABILIFY Take 2 mg by mouth daily. At 0900   aspirin EC 81 MG tablet Take 1 tablet (81 mg total) by mouth daily with breakfast. What changed: additional instructions   buPROPion 150 MG 24 hr tablet Commonly known as: WELLBUTRIN XL Take 150 mg by mouth daily. At 0800   cetaphil lotion Apply 1 application topically every 12 (twelve) hours as needed for dry skin. Apply to feet/legs   Dextromethorphan-guaiFENesin 5-100 MG/5ML Liqd Take 5 mLs by mouth daily as needed (cough).   diltiazem 360 MG 24 hr capsule Commonly known as: CARDIZEM CD Take 360 mg by mouth daily. At 0800   EPINEPHrine 0.3 mg/0.3 mL Devi Commonly known as: EPI-PEN Inject 0.3  mg into the muscle once as needed (for bee stings, then go to the ED immediately after using).   insulin aspart 100 UNIT/ML injection Commonly known as: novoLOG Inject 10 Units into the skin 3 (three) times daily before meals. At 0800, 1200, and 1700 Additional units according to sliding scale 151-200 = 2 units 201-250 = 4 units 251-300 = 6 units 301-350 = 8 units 351-400 = 10units 401-450 = 12units >400 = call MD   insulin detemir 100 UNIT/ML FlexPen Commonly known as: LEVEMIR Take 18 units daily and 14 units at night, subQ   levETIRAcetam 250 MG tablet Commonly known as: KEPPRA Take 250 mg by mouth 2 (two) times daily. At 0800 and 2000   levothyroxine 50 MCG tablet Commonly known as: SYNTHROID Take 50 mcg by mouth daily before breakfast. At 0800   LORazepam  0.5 MG tablet Commonly known as: ATIVAN Take 1 tablet (0.5 mg total) by mouth every 4 (four) hours as needed for anxiety.   metoprolol tartrate 100 MG tablet Commonly known as: LOPRESSOR Take 1 tablet (100 mg total) by mouth 2 (two) times daily.   midodrine 5 MG tablet Commonly known as: PROAMATINE Take 1 tablet (5 mg total) by mouth 3 (three) times daily with meals.   montelukast 10 MG tablet Commonly known as: SINGULAIR Take 10 mg by mouth at bedtime. At 2000   nitroGLYCERIN 0.4 MG SL tablet Commonly known as: NITROSTAT Place 0.4 mg under the tongue every 5 (five) minutes x 3 doses as needed for chest pain.   omeprazole 20 MG capsule Commonly known as: PRILOSEC Take 20 mg by mouth in the morning. At 0600   ondansetron 4 MG tablet Commonly known as: ZOFRAN Take 4 mg by mouth every 6 (six) hours as needed for nausea or vomiting.   oxyCODONE 5 MG immediate release tablet Commonly known as: Oxy IR/ROXICODONE Take 1-2 tablets (5-10 mg total) by mouth every 6 (six) hours as needed for moderate pain or severe pain (or dyspnea).   predniSONE 10 MG tablet Commonly known as: DELTASONE Take 10 mg by mouth daily. At 0800   primidone 50 MG tablet Commonly known as: MYSOLINE Take 50 mg by mouth daily. At 0800   traZODone 50 MG tablet Commonly known as: DESYREL Take 50 mg by mouth at bedtime. At [place], Costella Hatcher, MD Follow up.   Specialty: Internal Medicine Why: as needed Contact information: [place] [address] [phone]        Satira Sark, MD .   Specialty: Cardiology Contact information: [place] [address] [phone]              Allergies  Allergen Reactions  . Benztropine Mesylate Other (See Comments)    Alopecia and white spots on eyes  . Codeine Swelling, Palpitations and Other (See Comments)    Mouth Swelling and Tachycardia   . Diazepam Other (See Comments)    COMA  .  Diphenhydramine Hcl Anxiety, Palpitations and Other (See Comments)    Tachycardia and anxiousness  . Sulfonamide Derivatives Rash    Blisters, also  . Thioridazine Hcl Swelling  . Bee Venom     Documented on MAR  . Lisinopril Cough    Consultations:  Nephrology  Cardiology  ID  Critical Care  Palliative Care  Procedures/Studies: EEG  Result Date: 03/12/2020 Lora Havens, MD     03/12/2020 10:19 AM  Patient Name: [name] MRN: [id number] Epilepsy Attending: Lora Havens Referring Physician/Provider: Dr. Amie [place] Date: 03/12/2020 Duration: 24.37 minutes Patient history: 69 year old female with past medical history of catatonia, schizoaffective disorder as well as history of seizures who was noted to have a blank stare with some chewing movements of the mouth and not responding to questions lasting for about 15 minutes.  EEG to evaluate for seizures. Level of alertness: Awake AEDs during EEG study: LEV Technical aspects: This EEG study was done with scalp electrodes positioned according to the 10-20 International system of electrode placement. Electrical activity was acquired at a sampling rate of 500Hz  and reviewed with a high frequency filter of 70Hz  and a low frequency filter of 1Hz . EEG data were recorded continuously and digitally stored. Description: The posterior dominant rhythm consists of 6 Hz activity of moderate voltage (25-35 uV) seen predominantly in posterior head regions, symmetric and reactive to eye opening and eye closing. EEG showed continuous generalized 5 to 6 Hz theta slowing. Hyperventilation and photic stimulation were not performed.   ABNORMALITY -Continuous slow, generalized -Background slow IMPRESSION: This study is suggestive of moderate diffuse encephalopathy, nonspecific etiology. No seizures or epileptiform discharges were seen throughout the recording. Lora Havens   DG Abd 1 View  Result Date: 03/20/2020 CLINICAL DATA:  Nausea and  vomiting EXAM: ABDOMEN - 1 VIEW COMPARISON:  03/18/2020 FINDINGS: Scattered large and small bowel gas is noted. Contrast material is noted throughout the colon from recent CT. No obstructive changes are noted. No free air is noted. IMPRESSION: No obstructive abnormality is noted. Electronically Signed   By: Inez Catalina M.D.   On: 03/20/2020 16:02   CT Head Wo Contrast  Result Date: 03/07/2020 CLINICAL DATA:  Delirium.  Possible seizure.  Rightward gaze. EXAM: CT HEAD WITHOUT CONTRAST TECHNIQUE: Contiguous axial images were obtained from the base of the skull through the vertex without intravenous contrast. COMPARISON:  10/21/2019 FINDINGS: Brain: There is no evidence of acute infarct, intracranial hemorrhage, mass, midline shift, or extra-axial fluid collection. Mild cerebral atrophy is unchanged. Cerebral white matter hypodensities are also unchanged and nonspecific but compatible with mild chronic small vessel ischemic disease. Vascular: Calcified atherosclerosis at the skull base. No hyperdense vessel. Skull: No fracture or suspicious osseous lesion. Sinuses/Orbits: Mild polypoid mucosal thickening and/or small mucous retention cysts in the maxillary sinuses. Clear mastoid air cells. Bilateral cataract extraction. Other: None. IMPRESSION: 1. No evidence of acute intracranial abnormality. 2. Mild chronic small vessel ischemic disease. Electronically Signed   By: Logan Bores M.D.   On: 03/07/2020 04:26   MR BRAIN WO CONTRAST  Result Date: 03/12/2020 CLINICAL DATA:  69 year old female with altered mental status. On anticoagulation for atrial fibrillation. EXAM: MRI HEAD WITHOUT CONTRAST TECHNIQUE: Multiplanar, multiecho pulse sequences of the brain and surrounding structures were obtained without intravenous contrast. COMPARISON:  Head CT yesterday.  Brain MRI 09/27/2018. FINDINGS: Brain: Stable cerebral volume. No restricted diffusion to suggest acute infarction. No midline shift, mass effect, evidence of  mass lesion, ventriculomegaly, extra-axial collection or acute intracranial hemorrhage. Cervicomedullary junction and pituitary are within normal limits. Stable minimal nonspecific cerebral white matter T2 and FLAIR hyperintensity since 2020. Suggestion of a small chronic focus of encephalomalacia along the medial cerebellum near the vermis (series 6, image 7). No other cortical encephalomalacia. No chronic cerebral blood products are evident. Deep gray nuclei and brainstem remain normal. Vascular: Major intracranial vascular flow voids are stable since last year. Skull and upper cervical spine: Negative.  Sinuses/Orbits: Stable and negative orbits. Small maxillary sinus mucous retention cysts are stable. Other: Trace mastoid fluid is stable since last year. Grossly normal visible internal auditory structures. Scalp and face soft tissues appear negative. IMPRESSION: 1.  No acute intracranial abnormality. 2. 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 Electronically Signed   By: Genevie Ann M.D.   On: 03/12/2020 15:09   CT ABDOMEN PELVIS W CONTRAST  Result Date: 03/18/2020 CLINICAL DATA:  69 year old female with sepsis. EXAM: CT ABDOMEN AND PELVIS WITH CONTRAST TECHNIQUE: Multidetector CT imaging of the abdomen and pelvis was performed using the standard protocol following bolus administration of intravenous contrast. CONTRAST:  149mL OMNIPAQUE IOHEXOL 300 MG/ML  SOLN COMPARISON:  None. FINDINGS: Evaluation is limited due to streak artifact caused by patient's arms. Lower chest: There is a moderate size right pleural effusion with higher attenuating content suspicious for a serosanguineous or hemorrhagic fluid. Clinical correlation is recommended. There is consolidative changes of the visualized right lower lobe which may represent compressive atelectasis although infiltrate is not excluded. There is a small left  pleural effusion with partial left lung base atelectasis. There is mild cardiomegaly. Advanced coronary vascular calcification. No intra-abdominal free air. Small perihepatic ascites. Hepatobiliary: The liver is unremarkable. No intrahepatic biliary ductal dilatation. Cholecystectomy. Pancreas: Unremarkable. No pancreatic ductal dilatation or surrounding inflammatory changes. Spleen: Normal in size without focal abnormality. Adrenals/Urinary Tract: The adrenal glands unremarkable. There is moderate bilateral renal parenchyma atrophy. There is no hydronephrosis on either side. The visualized ureters and urinary bladder appear unremarkable. Stomach/Bowel: There is sigmoid diverticulosis without active inflammatory changes. There is moderate stool throughout the colon. There is no bowel obstruction. Appendectomy. Vascular/Lymphatic: Mild aortoiliac atherosclerotic disease. The IVC is unremarkable. No portal venous gas. There is no adenopathy. Reproductive: The uterus is grossly unremarkable. Other: There is diffuse subcutaneous edema and anasarca. No fluid collection. Small fat containing umbilical hernia. Musculoskeletal: Degenerative changes of the spine. No acute osseous pathology. IMPRESSION: 1. Moderate size right pleural effusion with higher attenuating content suspicious for a serosanguineous or hemorrhagic fluid. Clinical correlation is recommended. There is compressive atelectasis of the right lower lobe. 2. Small left pleural effusion with partial left lung base atelectasis. 3. Sigmoid diverticulosis. No bowel obstruction. 4. Diffuse subcutaneous edema and anasarca. 5. Aortic Atherosclerosis (ICD10-I70.0). These results will be called to the ordering clinician or representative by the Radiologist Assistant, and communication documented in the PACS or [employer]. Electronically Signed   By: Anner Crete M.D.   On: 03/18/2020 19:32   IR Removal Tun Cv Cath W/O FL  Result Date:  03/13/2020 INDICATION: End-stage renal disease on dialysis. Now bacteremia. Request for removal of tunneled hemodialysis catheter. The catheter was originally placed at an outside facility. EXAM: REMOVAL OF TUNNELED HEMODIALYSIS CATHETER MEDICATIONS: 1% lidocaine 3 mL COMPLICATIONS: None immediate. PROCEDURE: Informed written consent was obtained from the patient following an explanation of the procedure, risks, benefits and alternatives to treatment. A time out was performed prior to the initiation of the procedure. Maximal barrier sterile technique was utilized including caps, mask, sterile gowns, sterile gloves, large sterile drape, hand hygiene, and ChloraPrep. 1% lidocaine with epinephrine was injected under sterile conditions along the subcutaneous tunnel. Utilizing a combination of blunt dissection and gentle traction, the catheter was removed intact. Hemostasis was obtained with manual compression. A dressing was placed. The patient tolerated the procedure well without immediate post procedural  complication. IMPRESSION: Successful removal of tunneled dialysis catheter. Read by: Gareth Eagle, PA-C Electronically Signed   By: Corrie Mckusick D.O.   On: 03/13/2020 15:55   DG CHEST PORT 1 VIEW  Result Date: 03/13/2020 CLINICAL DATA:  Recent pleural effusion EXAM: PORTABLE CHEST 1 VIEW COMPARISON:  March 12, 2020 FINDINGS: Right subclavian catheter tip is in the superior vena cava near the cavoatrial junction. Catheter loops upon itself in the right axillary region, unchanged. Left central catheter tip is in the superior vena cava. No pneumothorax. There is a suspected small right pleural effusion. Lungs elsewhere are clear. Heart is enlarged with pulmonary vascularity normal. No adenopathy. There is degenerative change in each shoulder. IMPRESSION: 1. Suspect small right pleural effusion, stable. Lungs otherwise clear. 2. Stable cardiomegaly. Pulmonary vascularity currently appears normal. 3. The central  catheter positions are unchanged. The right subclavian catheter loops upon itself in the right axillary region, unchanged from 1 day prior. Electronically Signed   By: Lowella Grip III M.D.   On: 03/13/2020 08:37   DG CHEST PORT 1 VIEW  Result Date: 03/12/2020 CLINICAL DATA:  Pleural effusion. EXAM: PORTABLE CHEST 1 VIEW COMPARISON:  Radiograph 03/10/2020.  Most recent CT 01/23/2020 FINDINGS: Right subclavian central catheter tip in the lower SVC. Lateral portion of catheter is looped. Left-sided dialysis catheter tip in the lower SVC. Hazy opacity at the right lung base likely represents pleural effusion. There is no visualized pneumothorax. Stable cardiomegaly. Vascular congestion. Interstitial opacities may represent pulmonary edema. IMPRESSION: 1. Right subclavian central catheter tip in the lower SVC. Lateral portion of catheter is looped, this portion may be external to the patient. 2. Hazy opacity at the right lung base likely represents pleural effusion. 3. Stable cardiomegaly, vascular congestion and interstitial opacities, suggesting pulmonary edema. Electronically Signed   By: Keith Rake M.D.   On: 03/12/2020 17:31   DG Chest Port 1 View  Result Date: 03/10/2020 CLINICAL DATA:  Status post central line placement EXAM: PORTABLE CHEST 1 VIEW COMPARISON:  Film from earlier in the same day. FINDINGS: Cardiac shadow is enlarged but stable. Dialysis catheter on the left is again seen. Right-sided central venous line is noted with catheter tip at the cavoatrial junction. No pneumothorax is seen. Persistent airspace opacity in the right base is noted. No other focal abnormality is noted. IMPRESSION: No pneumothorax following central line placement. Electronically Signed   By: Inez Catalina M.D.   On: 03/10/2020 23:51   DG Chest Port 1 View  Result Date: 03/10/2020 CLINICAL DATA:  Short of breath, chest pain EXAM: PORTABLE CHEST 1 VIEW COMPARISON:  03/07/2020 FINDINGS: Cardiac enlargement  with vascular congestion and mild interstitial edema. Mild right lower lobe patchy airspace disease and small right effusion. Left jugular central venous catheter tip in the SVC unchanged. No pneumothorax. IMPRESSION: Cardiac enlargement with mild progression of vascular congestion and probable interstitial edema. Small right effusion. Mild progression of right lower lobe airspace disease. Electronically Signed   By: Franchot Gallo M.D.   On: 03/10/2020 10:47   DG Chest Port 1 View  Result Date: 03/07/2020 CLINICAL DATA:  Seizure, short of breath, asthma EXAM: PORTABLE CHEST 1 VIEW COMPARISON:  01/23/2020 FINDINGS: Single frontal view of the chest demonstrates stable left internal jugular dialysis catheter. Cardiac silhouette is enlarged but stable. There is chronic central vascular congestion, without acute airspace disease. Trace right pleural effusion. No pneumothorax. IMPRESSION: 1. Vascular congestion without overt edema. 2. Trace right pleural effusion. Electronically Signed   By:  Randa Ngo M.D.   On: 03/07/2020 00:46   DG Swallowing Func-Speech Pathology  Result Date: 03/18/2020 Objective Swallowing Evaluation: Type of Study: MBS-Modified Barium Swallow Study  Patient Details Name: [name] MRN: [id number] Date of Birth: 1950-11-02 Today's Date: 03/18/2020 Time: SLP Start Time (ACUTE ONLY): 1010 -SLP Stop Time (ACUTE ONLY): 1037 SLP Time Calculation (min) (ACUTE ONLY): 27 min Past Medical History: Past Medical History: Diagnosis Date . Anemia  . Angina pectoris ([place])  . Anxiety  . Asthma  . Atrial flutter ([place])  . Back pain  . Barrett esophagus  . Bifascicular block  . BMI 39.0-39.9,adult  . Cellulitis  . CHF (congestive heart failure) ([place])  . Chronic diastolic CHF (congestive heart failure) ([place])  . Constipation  . Contact with and (suspected) exposure to covid-19  . COPD (chronic obstructive pulmonary disease) ([place])  . Dependence on renal dialysis ([place])  . Depression  . Dysphagia  . Dysuria   . Epilepsy ([place])  . ESRD on hemodialysis ([place])  . Essential hypertension  . Fibromyalgia  . Fibromyalgia  . GERD (gastroesophageal reflux disease)  . Gout  . History of pericarditis  . Hypercholesterolemia  . Hyperlipidemia  . Hypothyroid  . Hypothyroidism  . Insomnia  . Long term (current) use of insulin ([place])  . Major depressive disorder  . Memory loss  . Moderate protein-calorie malnutrition ([place])  . Morbid obesity ([place])  . Nephrolithiasis   2008 . Obesity hypoventilation syndrome (HCC)   CPAP . Obstructive sleep apnea  . Pain in left arm  . PE (pulmonary embolism)   2010 . Peripheral neuropathy  . Persistent atrial fibrillation ([place])  . Pneumonia  . Polyp of nasal sinus  . PTSD (post-traumatic stress disorder)  . Pulmonary hypertension ([place])  . Rheumatoid arteritis ([place])  . Rheumatoid arthritis ([place])  . Schizophrenia ([place])  . 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 Seizures ([place])  . 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 Tachycardia, unspecified  . Type 2 diabetes mellitus ([place])  . Unspecified intellectual disabilities  . Unspecified psychosis not due to a substance or known physiological condition ([place])  . Urinary tract infection  . Vitamin D deficiency  Past Surgical History: Past Surgical History: Procedure Laterality Date . A/V FISTULAGRAM N/A 01/28/2020  Procedure: A/V FISTULAGRAM - Right [place];  Surgeon: Waynetta Sandy, MD;  Location: [facility];  Service: Cardiovascular;  Laterality: N/A; . APPENDECTOMY   . AV FISTULA PLACEMENT Right 09/27/2019  Procedure: ARTERIOVENOUS (AV) FISTULA CREATION;  Surgeon: Waynetta Sandy, MD;  Location: [facility];  Service: Vascular;  Laterality: Right; . AV FISTULA PLACEMENT Right 02/20/2020  Procedure: INSERTION OF ARTERIOVENOUS (AV) GORE-TEX GRAFT ARM;  Surgeon: Waynetta Sandy, MD;  Location: [facility];  Service: Vascular;  Laterality: Right; . CHOLECYSTECTOMY   . COLONOSCOPY    benign polyps (12/2000), repeat colonoscopy performed in 2007 .  ENDOMETRIAL BIOPSY    10/03 benign columnar mucosa (limited material) . INSERTION OF DIALYSIS CATHETER Right 09/14/2019  Procedure: ATTEMPTED INSERTION OF DIALYSIS CATHETER;  Surgeon: Virl Cagey, MD;  Location: [facility];  Service: General;  Laterality: Right; . INSERTION OF DIALYSIS CATHETER Right 09/27/2019  Procedure: INSERTION OF DIALYSIS CATHETER;  Surgeon: Waynetta Sandy, MD;  Location: [facility];  Service: Vascular;  Laterality: Right; . IR REMOVAL TUN CV CATH W/O FL  03/13/2020 . KIDNEY SURGERY   . REVISON OF ARTERIOVENOUS FISTULA Right 02/20/2020  Procedure: REVISON OF ARTERIOVENOUS FISTULA;  Surgeon: Waynetta Sandy, MD;  Location:  [facility];  Service: Vascular;  Laterality: Right; HPI: 69 y.o. female, known to SLP services, with medical history significant for acute-on-chronic dysphagia, chronic hypoxemic respiratory failure secondary to underlying COPD-on 3 L of oxygen via nasal cannula, paroxysmal A. fib-on Eliquis, ESRD on hemodialysis (TTS), diastolic CHF, moderate AS, hypertension, hyperlipidemia, diabetes mellitus, obstructive sleep apnea/OHS, rheumatoid arthritis, seizure disorder, depression/anxiety, bipolar disorder presented to emergency department 7/26 with SOB and elevated blood glucose. Developed resp failure, transferred to ICU, on BIPAP, CRRT initiated; Dx sepsis secondary to RLL pna, afib with RVR, acute metabolic encephalopathy.  Pt has been followed by SLP during multiple admissions the last several years, with acute decompensations of swallow function.  She has had MBS studies, some of which showed deep penetration and/or aspiration of thin liquids, chin tuck being effective at one time.  Subjective: alert, communicative Assessment / Plan / Recommendation CHL IP CLINICAL IMPRESSIONS 03/18/2020 Clinical Impression Pt presents with a mild oropharyngeal dysphagia. Oral deficits included prolonged mastication of solids, piecemeal swallowing, and oral residuals with solid PO;  cleared reflexively with additional swallows. Pharyngeal deficits c/b intermittent reduced timing and efficiency of laryngeal vestibule closure allowing for episodic pre swallow laryngeal penetraiton of thin liquids (x1 with cup sip; transient in nature & during barium tablet series with penetrates remaining in laryngeal vestibule). No aspiration visualized within confines of study. Pt with clinical hx of overt difficulty per chart review. Recommend thin liquids and regular textures with meds in puree as clinically tolerated.   SLP Visit Diagnosis Dysphagia, oropharyngeal phase (R13.12) Attention and concentration deficit following -- Frontal lobe and executive function deficit following -- Impact on safety and function Mild aspiration risk   CHL IP TREATMENT RECOMMENDATION 03/18/2020 Treatment Recommendations Therapy as outlined in treatment plan below   Prognosis 03/18/2020 Prognosis for Safe Diet Advancement Good Barriers to Reach Goals -- Barriers/Prognosis Comment -- CHL IP DIET RECOMMENDATION 03/18/2020 SLP Diet Recommendations Regular solids;Thin liquid Liquid Administration via Cup Medication Administration Whole meds with puree Compensations Slow rate;Small sips/bites Postural Changes Seated upright at 90 degrees   CHL IP OTHER RECOMMENDATIONS 03/18/2020 Recommended Consults -- Oral Care Recommendations Oral care BID Other Recommendations --   CHL IP FOLLOW UP RECOMMENDATIONS 03/17/2020 Follow up Recommendations Other (comment)   CHL IP FREQUENCY AND DURATION 03/18/2020 Speech Therapy Frequency (ACUTE ONLY) min 2x/week Treatment Duration 2 weeks      CHL IP ORAL PHASE 03/18/2020 Oral Phase Impaired Oral - Pudding Teaspoon -- Oral - Pudding Cup -- Oral - Honey Teaspoon -- Oral - Honey Cup -- Oral - Nectar Teaspoon -- Oral - Nectar Cup Delayed oral transit Oral - Nectar Straw -- Oral - Thin Teaspoon -- Oral - Thin Cup Delayed oral transit Oral - Thin Straw Delayed oral transit Oral - Puree WFL Oral - Mech Soft -- Oral -  Regular Decreased bolus cohesion;Lingual/palatal residue;Piecemeal swallowing;Delayed oral transit Oral - Multi-Consistency -- Oral - Pill WFL Oral Phase - Comment --  CHL IP PHARYNGEAL PHASE 03/18/2020 Pharyngeal Phase Impaired Pharyngeal- Pudding Teaspoon -- Pharyngeal -- Pharyngeal- Pudding Cup -- Pharyngeal -- Pharyngeal- Honey Teaspoon -- Pharyngeal -- Pharyngeal- Honey Cup -- Pharyngeal -- Pharyngeal- Nectar Teaspoon -- Pharyngeal -- Pharyngeal- Nectar Cup Pharyngeal residue - valleculae;Delayed swallow initiation-pyriform sinuses Pharyngeal Material does not enter airway Pharyngeal- Nectar Straw -- Pharyngeal -- Pharyngeal- Thin Teaspoon -- Pharyngeal -- Pharyngeal- Thin Cup Reduced epiglottic inversion;Penetration/Aspiration before swallow;Delayed swallow initiation-pyriform sinuses;Pharyngeal residue - valleculae Pharyngeal Material enters airway, remains ABOVE vocal cords then ejected out;Material does not enter  airway Pharyngeal- Thin Straw Delayed swallow initiation-pyriform sinuses Pharyngeal -- Pharyngeal- Puree WFL Pharyngeal -- Pharyngeal- Mechanical Soft -- Pharyngeal -- Pharyngeal- Regular Delayed swallow initiation-vallecula;Pharyngeal residue - valleculae Pharyngeal -- Pharyngeal- Multi-consistency -- Pharyngeal -- Pharyngeal- Pill Reduced epiglottic inversion;Delayed swallow initiation-pyriform sinuses;Penetration/Aspiration before swallow;Penetration/Aspiration during swallow Pharyngeal Material enters airway, remains ABOVE vocal cords and not ejected out Pharyngeal Comment --  CHL IP CERVICAL ESOPHAGEAL PHASE 03/18/2020 Cervical Esophageal Phase WFL Pudding Teaspoon -- Pudding Cup -- Honey Teaspoon -- Honey Cup -- Nectar Teaspoon -- Nectar Cup -- Nectar Straw -- Thin Teaspoon -- Thin Cup -- Thin Straw -- Puree -- Mechanical Soft -- Regular -- Multi-consistency -- Pill -- Cervical Esophageal Comment -- Chelsea E Hartness MA, CCC-SLP 03/18/2020, 11:59 AM              ECHOCARDIOGRAM  COMPLETE  Result Date: 03/11/2020    ECHOCARDIOGRAM REPORT   Patient Name:   [name] Date of Exam: 03/11/2020 Medical Rec #:  [id number]        Height:       62.0 in Accession #:    [id number]       Weight:       193.1 lb Date of Birth:  September 04, 1950        BSA:          1.883 m Patient Age:    69 years         BP:           90/41 mmHg Patient Gender: F                HR:           107 bpm. Exam Location:  Inpatient Procedure: 2D Echo, 3D Echo, Color Doppler and Cardiac Doppler STAT ECHO Indications:    Shock  History:        Patient has prior history of Echocardiogram examinations, most                 recent 11/23/2019. CHF, Arrythmias:Atrial Fibrillation; Risk                 Factors:Hypertension, Diabetes and Dyslipidemia.  Sonographer:    Raquel Sarna Senior RDCS Referring Phys: [phone] GRACE E BOWSER  Sonographer Comments: Scanned supine on bipap. IMPRESSIONS  1. Challenging to estimate EF with underlying tachycardia, however does not appear severly reduced.  2. Left ventricular ejection fraction, by estimation, is 45 to 50%. The left ventricle has mildly decreased function. The left ventricle demonstrates global hypokinesis. Left ventricular diastolic parameters are indeterminate.  3. Right ventricular systolic function is mildly reduced. The right ventricular size is moderately enlarged. There is moderately elevated pulmonary artery systolic pressure. The estimated right ventricular systolic pressure is 12.4 mmHg.  4. Left atrial size was severely dilated.  5. Right atrial size was moderately dilated.  6. The mitral valve is normal in structure. Mild mitral valve regurgitation. No evidence of mitral stenosis.  7. Tricuspid valve regurgitation is moderate to severe.  8. The aortic valve is tricuspid. Aortic valve regurgitation is not visualized. Mild to moderate aortic valve sclerosis/calcification is present, without any evidence of aortic stenosis.  9. The inferior vena cava is dilated in size with <50%  respiratory variability, suggesting right atrial pressure of 15 mmHg. FINDINGS  Left Ventricle: Left ventricular ejection fraction, by estimation, is 45 to 50%. The left ventricle has mildly decreased function. The left ventricle demonstrates global hypokinesis. The left ventricular internal cavity size was normal in size.  There is  no left ventricular hypertrophy. Left ventricular diastolic parameters are indeterminate. Right Ventricle: The right ventricular size is moderately enlarged. No increase in right ventricular wall thickness. Right ventricular systolic function is mildly reduced. There is moderately elevated pulmonary artery systolic pressure. The tricuspid regurgitant velocity is 3.11 m/s, and with an assumed right atrial pressure of 15 mmHg, the estimated right ventricular systolic pressure is 60.7 mmHg. Left Atrium: Left atrial size was severely dilated. Right Atrium: Right atrial size was moderately dilated. Pericardium: There is no evidence of pericardial effusion. Mitral Valve: The mitral valve is normal in structure. Normal mobility of the mitral valve leaflets. Moderate mitral annular calcification. Mild mitral valve regurgitation. No evidence of mitral valve stenosis. Tricuspid Valve: The tricuspid valve is normal in structure. Tricuspid valve regurgitation is moderate to severe. No evidence of tricuspid stenosis. Aortic Valve: The aortic valve is tricuspid. Aortic valve regurgitation is not visualized. Mild to moderate aortic valve sclerosis/calcification is present, without any evidence of aortic stenosis. Pulmonic Valve: The pulmonic valve was normal in structure. Pulmonic valve regurgitation is mild. No evidence of pulmonic stenosis. Aorta: The aortic root is normal in size and structure. Venous: The inferior vena cava is dilated in size with less than 50% respiratory variability, suggesting right atrial pressure of 15 mmHg. IAS/Shunts: No atrial level shunt detected by color flow Doppler.  Additional Comments: Challenging to estimate EF with underlying tachycardia, however does not appear severly reduced.  LEFT VENTRICLE PLAX 2D LVIDd:         5.30 cm LVIDs:         4.40 cm LV PW:         1.00 cm LV IVS:        0.90 cm LVOT diam:     2.10 cm LV SV:         45 LV SV Index:   24 LVOT Area:     3.46 cm  RIGHT VENTRICLE RV S prime:     4.24 cm/s TAPSE (M-mode): 1.0 cm LEFT ATRIUM              Index       RIGHT ATRIUM           Index LA diam:        5.20 cm  2.76 cm/m  RA Area:     22.50 cm LA Vol (A2C):   145.0 ml 76.99 ml/m RA Volume:   70.50 ml  37.43 ml/m LA Vol (A4C):   142.0 ml 75.39 ml/m LA Biplane Vol: 145.0 ml 76.99 ml/m  AORTIC VALVE LVOT Vmax:   64.00 cm/s LVOT Vmean:  45.200 cm/s LVOT VTI:    0.130 m  AORTA Ao Root diam: 2.70 cm Ao Asc diam:  3.10 cm TRICUSPID VALVE TR Peak grad:   38.7 mmHg TR Vmax:        311.00 cm/s  SHUNTS Systemic VTI:  0.13 m Systemic Diam: 2.10 cm Candee Furbish MD Electronically signed by Candee Furbish MD Signature Date/Time: 03/11/2020/10:45:09 AM    Final    CT HEAD CODE STROKE WO CONTRAST  Result Date: 03/11/2020 CLINICAL DATA:  Code stroke.  69 year old female code stroke. EXAM: CT HEAD WITHOUT CONTRAST TECHNIQUE: Contiguous axial images were obtained from the base of the skull through the vertex without intravenous contrast. COMPARISON:  Head CT 03/07/2020.  Brain MRI 09/27/2018. FINDINGS: Brain: Gray-white matter differentiation is stable and normal for age throughout the brain. No midline shift, ventriculomegaly, mass effect, evidence of mass  lesion, intracranial hemorrhage or evidence of cortically based acute infarction. Vascular: Calcified atherosclerosis at the skull base. No suspicious intracranial vascular hyperdensity. Skull: No acute osseous abnormality identified. Sinuses/Orbits: Visualized paranasal sinuses and mastoids are stable and well pneumatized. Other: No acute orbit or scalp soft tissue finding. ASPECTS [facility] Stroke Program Early CT  Score) Total score (0-10 with 10 being normal): 10 IMPRESSION: 1. Stable and normal for age non contrast CT appearance of the brain. ASPECTS 10. 2. These results were communicated to Dr. Rory Percy at 10:19 pm on 03/11/2020 by text page via the [facility] messaging system. Electronically Signed   By: Genevie Ann M.D.   On: 03/11/2020 22:19   US Abdomen Limited RUQ  Result Date: 03/10/2020 CLINICAL DATA:  Elevated LFTs EXAM: ULTRASOUND ABDOMEN LIMITED RIGHT UPPER QUADRANT COMPARISON:  None. FINDINGS: Gallbladder: Surgically removed Common bile duct: Diameter: 4.5 mm Liver: Diffusely increased in echogenicity consistent with fatty infiltration. No mass is seen. Note is made of a small right pleural effusion. Other: None. IMPRESSION: Status post cholecystectomy. Fatty infiltration of the liver. No acute abnormality in the upper abdomen is seen. Incidental note of right pleural effusion. Electronically Signed   By: Inez Catalina M.D.   On: 03/10/2020 21:03     Subjective: Very eager to return to facility  Discharge Exam: Vitals:   03/25/20 1942 03/26/20 0803  BP:  126/81  Pulse:  (!) 102  Resp:    Temp: 98 F (36.7 C)   SpO2:  98%   Vitals:   03/25/20 1135 03/25/20 1136 03/25/20 1942 03/26/20 0803  BP:  119/77  126/81  Pulse:    (!) 102  Resp:      Temp: 98.6 F (37 C)  98 F (36.7 C)   TempSrc: Oral  Oral   SpO2:    98%  Weight:      Height:        General: Pt is alert, awake, not in acute distress Cardiovascular: RRR, S1/S2 +, no rubs, no gallops Respiratory: CTA bilaterally, no wheezing, no rhonchi Abdominal: Soft, NT, ND, bowel sounds + Extremities: no edema, no cyanosis   The results of significant diagnostics from this hospitalization (including imaging, microbiology, ancillary and laboratory) are listed below for reference.     Microbiology: Recent Results (from the past 240 hour(s))  Culture, blood (Routine X 2) w Reflex to ID Panel     Status: Abnormal   Collection Time: 03/17/20   3:54 PM   Specimen: BLOOD LEFT ARM  Result Value Ref Range Status   Specimen Description BLOOD LEFT ARM  Final   Special Requests   Final    BOTTLES DRAWN AEROBIC ONLY Blood Culture adequate volume   Culture  Setup Time   Final    GRAM NEGATIVE RODS AEROBIC BOTTLE ONLY CRITICAL RESULT CALLED TO, READ BACK BY AND VERIFIED WITH: A. Rogers Blocker PharmD 8:45 03/18/20 ([email or web address]) Performed at [facility], [place] [address], [place], [place] [address]    Culture (A)  Final    ESCHERICHIA COLI Confirmed Extended Spectrum Beta-Lactamase Producer (ESBL).  In bloodstream infections from ESBL organisms, carbapenems are preferred over piperacillin/tazobactam. They are shown to have a lower risk of mortality.    Report Status 03/20/2020 FINAL  Final   Organism ID, Bacteria ESCHERICHIA COLI  Final      Susceptibility   Escherichia coli - MIC*    AMPICILLIN >=32 RESISTANT Resistant     CEFAZOLIN >=64 RESISTANT Resistant     CEFEPIME >=32  RESISTANT Resistant     CEFTAZIDIME >=64 RESISTANT Resistant     CEFTRIAXONE >=64 RESISTANT Resistant     CIPROFLOXACIN >=4 RESISTANT Resistant     GENTAMICIN <=1 SENSITIVE Sensitive     IMIPENEM <=0.25 SENSITIVE Sensitive     TRIMETH/SULFA >=320 RESISTANT Resistant     AMPICILLIN/SULBACTAM >=32 RESISTANT Resistant     PIP/TAZO 64 INTERMEDIATE Intermediate     * ESCHERICHIA COLI  Culture, blood (Routine X 2) w Reflex to ID Panel     Status: Abnormal   Collection Time: 03/17/20  3:54 PM   Specimen: BLOOD LEFT ARM  Result Value Ref Range Status   Specimen Description BLOOD LEFT ARM  Final   Special Requests   Final    BOTTLES DRAWN AEROBIC ONLY Blood Culture adequate volume   Culture  Setup Time   Final    GRAM NEGATIVE RODS AEROBIC BOTTLE ONLY CRITICAL VALUE NOTED.  VALUE IS CONSISTENT WITH PREVIOUSLY REPORTED AND CALLED VALUE.    Culture (A)  Final    ESCHERICHIA COLI SUSCEPTIBILITIES PERFORMED ON PREVIOUS CULTURE WITHIN THE LAST 5 DAYS. Performed at [facility], [place] [address], [place], [place] [address]    Report Status 03/20/2020 FINAL  Final  Blood Culture ID Panel (Reflexed)     Status: Abnormal   Collection Time: 03/17/20  3:54 PM  Result Value Ref Range Status   Enterococcus faecalis NOT DETECTED NOT DETECTED Final   Enterococcus Faecium NOT DETECTED NOT DETECTED Final   Listeria monocytogenes NOT DETECTED NOT DETECTED Final   Staphylococcus species NOT DETECTED NOT DETECTED Final   Staphylococcus aureus (BCID) NOT DETECTED NOT DETECTED Final   Staphylococcus epidermidis NOT DETECTED NOT DETECTED Final   Staphylococcus lugdunensis NOT DETECTED NOT DETECTED Final   Streptococcus species NOT DETECTED NOT DETECTED Final   Streptococcus agalactiae NOT DETECTED NOT DETECTED Final   Streptococcus pneumoniae NOT DETECTED NOT DETECTED Final   Streptococcus pyogenes NOT DETECTED NOT DETECTED Final   A.calcoaceticus-baumannii NOT DETECTED NOT DETECTED Final   Bacteroides fragilis NOT DETECTED NOT DETECTED Final   Enterobacterales DETECTED (A) NOT DETECTED Final    Comment: Enterobacterales represent a large order of gram negative bacteria, not a single organism. CRITICAL RESULT CALLED TO, READ BACK BY AND VERIFIED WITH: A. Rogers Blocker PharmD 8:45 03/18/20 ([email or web address])    Enterobacter cloacae complex NOT DETECTED NOT DETECTED Final   Escherichia coli DETECTED (A) NOT DETECTED Final    Comment: CRITICAL RESULT CALLED TO, READ BACK BY AND VERIFIED WITH: A. Rogers Blocker PharmD 8:45 03/18/20 (wilsonm)    Klebsiella aerogenes NOT DETECTED NOT DETECTED Final   Klebsiella oxytoca NOT DETECTED NOT DETECTED Final   Klebsiella pneumoniae NOT DETECTED NOT DETECTED Final   Proteus species NOT DETECTED NOT DETECTED Final   Salmonella species NOT DETECTED NOT DETECTED Final   Serratia marcescens NOT DETECTED NOT DETECTED Final   Haemophilus influenzae NOT DETECTED NOT DETECTED Final   Neisseria meningitidis NOT DETECTED NOT DETECTED Final   Pseudomonas  aeruginosa NOT DETECTED NOT DETECTED Final   Stenotrophomonas maltophilia NOT DETECTED NOT DETECTED Final   Candida albicans NOT DETECTED NOT DETECTED Final   Candida auris NOT DETECTED NOT DETECTED Final   Candida glabrata NOT DETECTED NOT DETECTED Final   Candida krusei NOT DETECTED NOT DETECTED Final   Candida parapsilosis NOT DETECTED NOT DETECTED Final   Candida tropicalis NOT DETECTED NOT DETECTED Final   Cryptococcus neoformans/gattii NOT DETECTED NOT DETECTED Final   CTX-M ESBL DETECTED (A) NOT DETECTED  Final    Comment: CRITICAL RESULT CALLED TO, READ BACK BY AND VERIFIED WITH: A. Rogers Blocker PharmD 8:45 03/18/20 (wilsonm) (NOTE) Extended spectrum beta-lactamase detected. Recommend a carbapenem as initial therapy.      Carbapenem resistance IMP NOT DETECTED NOT DETECTED Final   Carbapenem resistance KPC NOT DETECTED NOT DETECTED Final   Carbapenem resistance NDM NOT DETECTED NOT DETECTED Final   Carbapenem resist OXA 48 LIKE NOT DETECTED NOT DETECTED Final   Carbapenem resistance VIM NOT DETECTED NOT DETECTED Final    Comment: Performed at [facility], [place] [address], [place], [place] [address]  Culture, blood (routine x 2)     Status: None   Collection Time: 03/19/20  4:02 PM   Specimen: BLOOD LEFT ARM  Result Value Ref Range Status   Specimen Description BLOOD LEFT ARM  Final   Special Requests   Final    BOTTLES DRAWN AEROBIC ONLY Blood Culture adequate volume   Culture   Final    NO GROWTH 5 DAYS Performed at [facility], [address], [place], [place] [address]    Report Status 03/24/2020 FINAL  Final  Culture, blood (routine x 2)     Status: None   Collection Time: 03/19/20  4:02 PM   Specimen: BLOOD LEFT ARM  Result Value Ref Range Status   Specimen Description BLOOD LEFT ARM  Final   Special Requests   Final    BOTTLES DRAWN AEROBIC ONLY Blood Culture adequate volume   Culture   Final    NO GROWTH 5 DAYS Performed at [facility], [place] [address], [place], [place] [address]    Report Status 03/24/2020 FINAL  Final  SARS Coronavirus 2 by RT PCR (hospital order, performed in [facility] hospital lab) Nasopharyngeal Nasopharyngeal Swab     Status: None   Collection Time: 03/24/20  2:25 PM   Specimen: Nasopharyngeal Swab  Result Value Ref Range Status   SARS Coronavirus 2 NEGATIVE NEGATIVE Final    Comment: (NOTE) SARS-CoV-2 target nucleic acids are NOT DETECTED.  The SARS-CoV-2 RNA is generally detectable in upper and lower respiratory specimens during the acute phase of infection. The lowest concentration of SARS-CoV-2 viral copies this assay can detect is 250 copies / mL. A negative result does not preclude SARS-CoV-2 infection and should not be used as the sole basis for treatment or other patient management decisions.  A negative result may occur with improper specimen collection / handling, submission of specimen other than nasopharyngeal swab, presence of viral mutation(s) within the areas targeted by this assay, and inadequate number of viral copies (<250 copies / mL). A negative result must be combined with clinical observations, patient history, and epidemiological information.  Fact Sheet for Patients:   StrictlyIdeas.no  Fact Sheet for Healthcare Providers: BankingDealers.co.za  This test is not yet approved or  cleared by the [place] FDA and has been authorized for detection and/or diagnosis of SARS-CoV-2 by FDA under an Emergency Use Authorization (EUA).  This EUA will remain in effect (meaning this test can be used) for the duration of the COVID-19 declaration under Section 564(b)(1) of the Act, 21 U.S.C. section 360bbb-3(b)(1), unless the authorization is terminated or revoked sooner.  Performed at [facility], [place] [address], [place],  [address]      Labs: BNP (last 3 results) Recent Labs    09/06/19 0343 11/22/19 1418  03/10/20 1032  BNP 1,113.0* 1,131.0* 1,660.6*   Basic Metabolic  Panel: Recent Labs  Lab 03/21/20 1822 03/22/20 0725 03/23/20 0908 03/24/20 0541 03/25/20 0747  NA 132* 129* 135 137 135  K 5.1 4.4 4.5 4.9 4.7  CL 94* 93* 97* 97* 95*  CO2 25 24 24 27 26   GLUCOSE 254* 170* 84 64* 106*  BUN 44* 49* 26* 42* 51*  CREATININE 4.07* 4.36* 3.18* 3.91* 4.49*  CALCIUM 10.0 9.8 9.2 10.0 9.9  MG 2.3 2.6* 2.3 2.4  --   PHOS 3.8 4.0 2.6 2.9 3.1   Liver Function Tests: Recent Labs  Lab 03/21/20 1822 03/22/20 0725 03/23/20 0908 03/24/20 0541 03/25/20 0747  ALBUMIN 2.7* 2.6* 2.6* 2.8* 2.5*   No results for input(s): LIPASE, AMYLASE in the last 168 hours. No results for input(s): AMMONIA in the last 168 hours. CBC: Recent Labs  Lab 03/21/20 1822 03/22/20 0725 03/23/20 0908 03/24/20 0541 03/25/20 0747  WBC 12.5* 10.5 9.7 10.8* 10.5  NEUTROABS 11.5*  --  6.7  --   --   HGB 10.1* 9.0* 9.6* 10.1* 8.9*  HCT 31.5* 28.7* 30.5* 31.8* 27.8*  MCV 110.9* 110.8* 113.8* 112.8* 111.6*  PLT 136* 141* 152 175 195   Cardiac Enzymes: No results for input(s): CKTOTAL, CKMB, CKMBINDEX, TROPONINI in the last 168 hours. BNP: Invalid input(s): POCBNP CBG: Recent Labs  Lab 03/24/20 1630 03/24/20 2121 03/25/20 0611 03/25/20 1133 03/25/20 2157  GLUCAP 122* 71 138* 125* 267*   D-Dimer No results for input(s): DDIMER in the last 72 hours. Hgb A1c No results for input(s): HGBA1C in the last 72 hours. Lipid Profile No results for input(s): CHOL, HDL, LDLCALC, TRIG, CHOLHDL, LDLDIRECT in the last 72 hours. Thyroid function studies No results for input(s): TSH, T4TOTAL, T3FREE, THYROIDAB in the last 72 hours.  Invalid input(s): FREET3 Anemia work up No results for input(s): VITAMINB12, FOLATE, FERRITIN, TIBC, IRON, RETICCTPCT in the last 72 hours. Urinalysis    Component Value Date/Time   COLORURINE YELLOW 01/23/2020 1452   APPEARANCEUR TURBID (A) 01/23/2020 1452   LABSPEC 1.016  01/23/2020 1452   PHURINE 5.0 01/23/2020 1452   GLUCOSEU >=500 (A) 01/23/2020 1452   HGBUR LARGE (A) 01/23/2020 1452   HGBUR negative 10/20/2009 1425   BILIRUBINUR NEGATIVE 01/23/2020 1452   KETONESUR NEGATIVE 01/23/2020 1452   PROTEINUR >=300 (A) 01/23/2020 1452   UROBILINOGEN 0.2 10/05/2011 1002   NITRITE NEGATIVE 01/23/2020 1452   LEUKOCYTESUR MODERATE (A) 01/23/2020 1452   Seps[facility] Invalid input(s): PROCALCITONIN,  WBC,  LACTICIDVEN Microbiology Recent Results (from the past 240 hour(s))  Culture, blood (Routine X 2) w Reflex to ID Panel     Status: Abnormal   Collection Time: 03/17/20  3:54 PM   Specimen: BLOOD LEFT ARM  Result Value Ref Range Status   Specimen Description BLOOD LEFT ARM  Final   Special Requests   Final    BOTTLES DRAWN AEROBIC ONLY Blood Culture adequate volume   Culture  Setup Time   Final    GRAM NEGATIVE RODS AEROBIC BOTTLE ONLY CRITICAL RESULT CALLED TO, READ BACK BY AND VERIFIED WITH: A. Rogers Blocker PharmD 8:45 03/18/20 ([email or web address]) Performed at [facility], [place] [address] [place], [place] [address]    Culture (A)  Final    ESCHERICHIA COLI Confirmed Extended Spectrum Beta-Lactamase Producer (ESBL).  In bloodstream infections from ESBL organisms, carbapenems are preferred over piperacillin/tazobactam. They are shown to have a lower risk of mortality.    Report Status 03/20/2020 FINAL  Final   Organism ID, Bacteria ESCHERICHIA COLI  Final      Susceptibility   Escherichia coli - MIC*    AMPICILLIN >=32 RESISTANT Resistant     CEFAZOLIN >=64 RESISTANT Resistant     CEFEPIME >=32 RESISTANT Resistant     CEFTAZIDIME >=64 RESISTANT Resistant     CEFTRIAXONE >=64 RESISTANT Resistant     CIPROFLOXACIN >=4 RESISTANT Resistant     GENTAMICIN <=1 SENSITIVE Sensitive     IMIPENEM <=0.25 SENSITIVE Sensitive     TRIMETH/SULFA >=320 RESISTANT Resistant     AMPICILLIN/SULBACTAM >=32 RESISTANT Resistant     PIP/TAZO 64 INTERMEDIATE Intermediate     *  ESCHERICHIA COLI  Culture, blood (Routine X 2) w Reflex to ID Panel     Status: Abnormal   Collection Time: 03/17/20  3:54 PM   Specimen: BLOOD LEFT ARM  Result Value Ref Range Status   Specimen Description BLOOD LEFT ARM  Final   Special Requests   Final    BOTTLES DRAWN AEROBIC ONLY Blood Culture adequate volume   Culture  Setup Time   Final    GRAM NEGATIVE RODS AEROBIC BOTTLE ONLY CRITICAL VALUE NOTED.  VALUE IS CONSISTENT WITH PREVIOUSLY REPORTED AND CALLED VALUE.    Culture (A)  Final    ESCHERICHIA COLI SUSCEPTIBILITIES PERFORMED ON PREVIOUS CULTURE WITHIN THE LAST 5 DAYS. Performed at [facility], [place] [address] [place], [place] [address]    Report Status 03/20/2020 FINAL  Final  Blood Culture ID Panel (Reflexed)     Status: Abnormal   Collection Time: 03/17/20  3:54 PM  Result Value Ref Range Status   Enterococcus faecalis NOT DETECTED NOT DETECTED Final   Enterococcus Faecium NOT DETECTED NOT DETECTED Final   Listeria monocytogenes NOT DETECTED NOT DETECTED Final   Staphylococcus species NOT DETECTED NOT DETECTED Final   Staphylococcus aureus (BCID) NOT DETECTED NOT DETECTED Final   Staphylococcus epidermidis NOT DETECTED NOT DETECTED Final   Staphylococcus lugdunensis NOT DETECTED NOT DETECTED Final   Streptococcus species NOT DETECTED NOT DETECTED Final   Streptococcus agalactiae NOT DETECTED NOT DETECTED Final   Streptococcus pneumoniae NOT DETECTED NOT DETECTED Final   Streptococcus pyogenes NOT DETECTED NOT DETECTED Final   A.calcoaceticus-baumannii NOT DETECTED NOT DETECTED Final   Bacteroides fragilis NOT DETECTED NOT DETECTED Final   Enterobacterales DETECTED (A) NOT DETECTED Final    Comment: Enterobacterales represent a large order of gram negative bacteria, not a single organism. CRITICAL RESULT CALLED TO, READ BACK BY AND VERIFIED WITH: A. Rogers Blocker PharmD 8:45 03/18/20 ([email or web address])    Enterobacter cloacae complex NOT DETECTED NOT DETECTED Final    Escherichia coli DETECTED (A) NOT DETECTED Final    Comment: CRITICAL RESULT CALLED TO, READ BACK BY AND VERIFIED WITH: A. Rogers Blocker PharmD 8:45 03/18/20 (wilsonm)    Klebsiella aerogenes NOT DETECTED NOT DETECTED Final   Klebsiella oxytoca NOT DETECTED NOT DETECTED Final   Klebsiella pneumoniae NOT DETECTED NOT DETECTED Final   Proteus species NOT DETECTED NOT DETECTED Final   Salmonella species NOT DETECTED NOT DETECTED Final   Serratia marcescens NOT DETECTED NOT DETECTED Final   Haemophilus influenzae NOT DETECTED NOT DETECTED Final   Neisseria meningitidis NOT DETECTED NOT DETECTED Final   Pseudomonas aeruginosa NOT DETECTED NOT DETECTED Final   Stenotrophomonas maltophilia NOT DETECTED NOT DETECTED Final   Candida albicans NOT DETECTED NOT DETECTED Final   Candida auris NOT DETECTED NOT DETECTED Final   Candida glabrata NOT DETECTED NOT DETECTED Final   Candida krusei NOT DETECTED NOT DETECTED Final  Candida parapsilosis NOT DETECTED NOT DETECTED Final   Candida tropicalis NOT DETECTED NOT DETECTED Final   Cryptococcus neoformans/gattii NOT DETECTED NOT DETECTED Final   CTX-M ESBL DETECTED (A) NOT DETECTED Final    Comment: CRITICAL RESULT CALLED TO, READ BACK BY AND VERIFIED WITH: A. Rogers Blocker PharmD 8:45 03/18/20 (wilsonm) (NOTE) Extended spectrum beta-lactamase detected. Recommend a carbapenem as initial therapy.      Carbapenem resistance IMP NOT DETECTED NOT DETECTED Final   Carbapenem resistance KPC NOT DETECTED NOT DETECTED Final   Carbapenem resistance NDM NOT DETECTED NOT DETECTED Final   Carbapenem resist OXA 48 LIKE NOT DETECTED NOT DETECTED Final   Carbapenem resistance VIM NOT DETECTED NOT DETECTED Final    Comment: Performed at [facility], [place] [address] [place], [place] [address]  Culture, blood (routine x 2)     Status: None   Collection Time: 03/19/20  4:02 PM   Specimen: BLOOD LEFT ARM  Result Value Ref Range Status   Specimen Description BLOOD LEFT ARM   Final   Special Requests   Final    BOTTLES DRAWN AEROBIC ONLY Blood Culture adequate volume   Culture   Final    NO GROWTH 5 DAYS Performed at [facility], [address], [place], [place] [address]    Report Status 03/24/2020 FINAL  Final  Culture, blood (routine x 2)     Status: None   Collection Time: 03/19/20  4:02 PM   Specimen: BLOOD LEFT ARM  Result Value Ref Range Status   Specimen Description BLOOD LEFT ARM  Final   Special Requests   Final    BOTTLES DRAWN AEROBIC ONLY Blood Culture adequate volume   Culture   Final    NO GROWTH 5 DAYS Performed at [facility], [place] [address], [place], [place] [address]    Report Status 03/24/2020 FINAL  Final  SARS Coronavirus 2 by RT PCR (hospital order, performed in [facility] hospital lab) Nasopharyngeal Nasopharyngeal Swab     Status: None   Collection Time: 03/24/20  2:25 PM   Specimen: Nasopharyngeal Swab  Result Value Ref Range Status   SARS Coronavirus 2 NEGATIVE NEGATIVE Final    Comment: (NOTE) SARS-CoV-2 target nucleic acids are NOT DETECTED.  The SARS-CoV-2 RNA is generally detectable in upper and lower respiratory specimens during the acute phase of infection. The lowest concentration of SARS-CoV-2 viral copies this assay can detect is 250 copies / mL. A negative result does not preclude SARS-CoV-2 infection and should not be used as the sole basis for treatment or other patient management decisions.  A negative result may occur with improper specimen collection / handling, submission of specimen other than nasopharyngeal swab, presence of viral mutation(s) within the areas targeted by this assay, and inadequate number of viral copies (<250 copies / mL). A negative result must be combined with clinical observations, patient history, and epidemiological information.  Fact Sheet for Patients:   StrictlyIdeas.no  Fact Sheet for Healthcare  Providers: BankingDealers.co.za  This test is not yet approved or  cleared by the [place] FDA and has been authorized for detection and/or diagnosis of SARS-CoV-2 by FDA under an Emergency Use Authorization (EUA).  This EUA will remain in effect (meaning this test can be used) for the duration of the COVID-19 declaration under Section 564(b)(1) of the Act, 21 U.S.C. section 360bbb-3(b)(1), unless the authorization is terminated or revoked sooner.  Performed at [facility], [place] [address]  [place], [place] [address]    Time spent; 30 min  SIGNED:   Marylu Lund, MD  [facility] 03/26/2020, 1:41 PM  If 7PM-7AM, please contact night-coverage

## 2020-03-26 NOTE — Progress Notes (Signed)
D/C instructions printed and placed in packet at nurse's station along with MOST form and DNR. Scripts attached to outside of packet for MD signature.

## 2020-03-26 NOTE — Progress Notes (Signed)
Report was given to [facility] [address]

## 2020-03-26 NOTE — Progress Notes (Signed)
Pt transitioning to hospice, appreciate [facility] and PCT team efforts.  Will sign off.   Kelly Splinter, MD 03/26/2020, 8:34 AM

## 2020-04-04 IMAGING — CT CT HEAD W/O CM
3 series · 15 of 47 positions shown, 18 images · non-contrast
Comparison: 09/11/2019 and earlier, including MRI brain 09/27/2018
and earlier.

CLINICAL DATA: 69-year-old [HOSPITAL] patient presenting with
acute onset of seizures. Hyperglycemia. Acute mental status changes.

EXAM:
CT HEAD WITHOUT CONTRAST
TECHNIQUE: Contiguous axial images were obtained from the base of the skull
through the vertex without intravenous contrast.

[Series 2: head w o · axial · 0.44mm/px · z∈[-71,+54]mm · 9 of 31 slices shown, 12 images]
[im 3/31  brain]
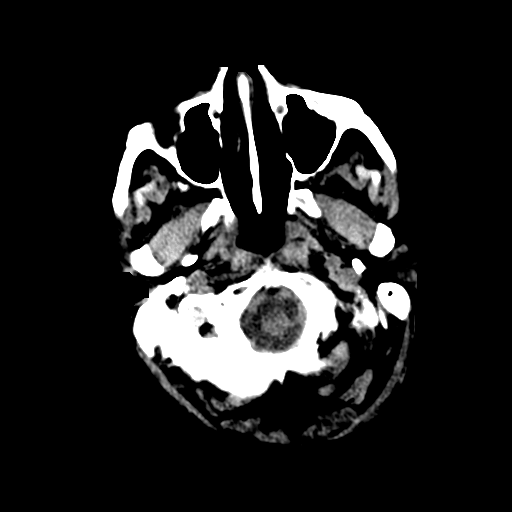
[im 3/31  bone]
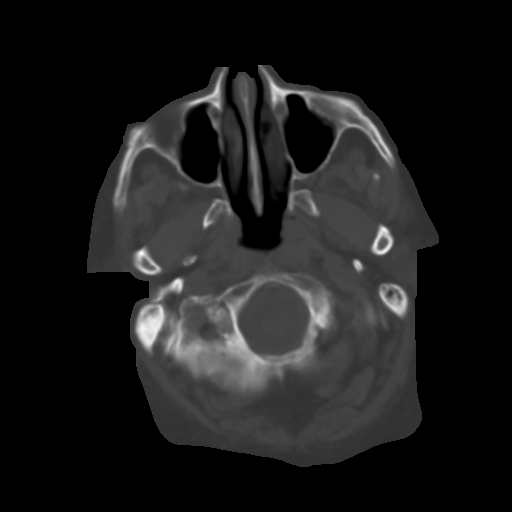
[im 6/31  brain]
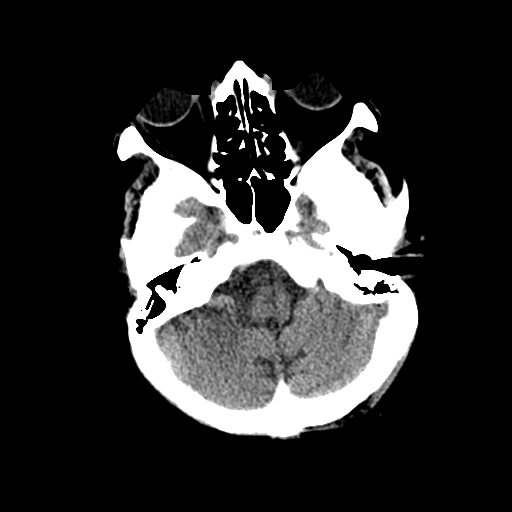
[im 9/31  brain]
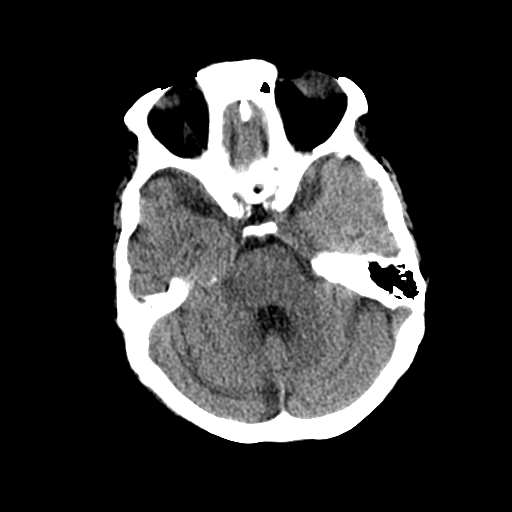
[im 12/31  brain]
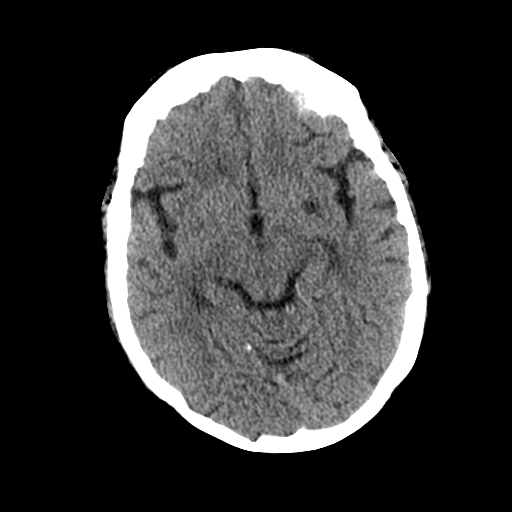
[im 16/31  brain]
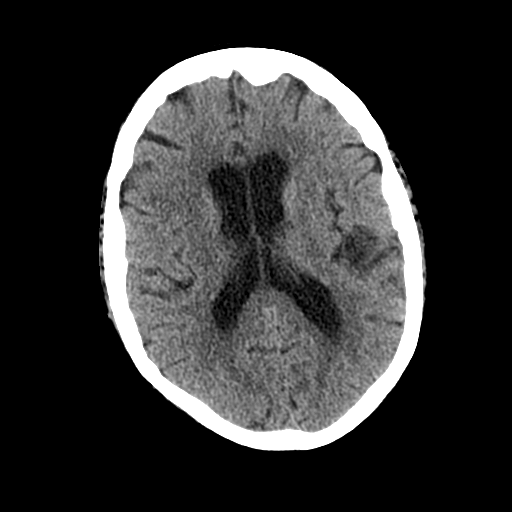
[im 16/31  bone]
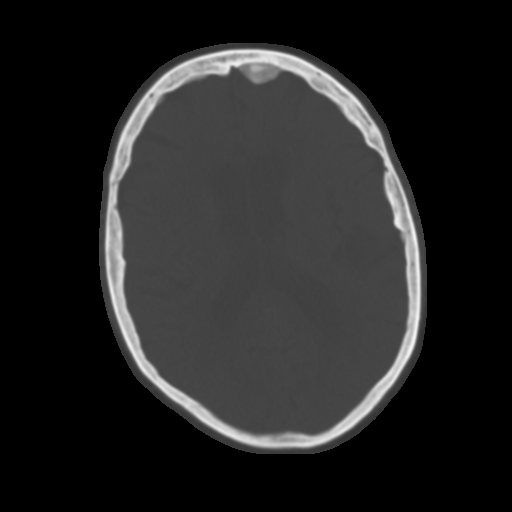
[im 19/31  brain]
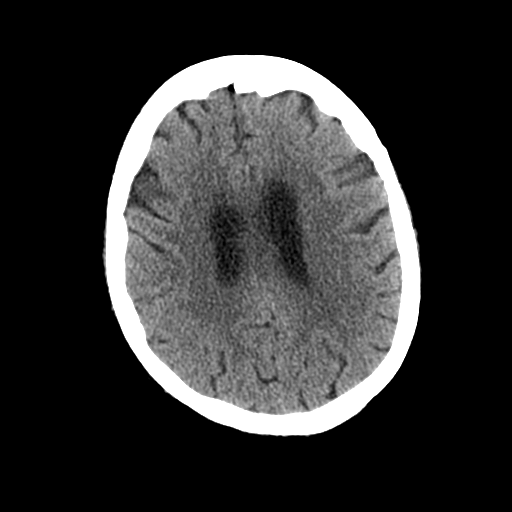
[im 22/31  brain]
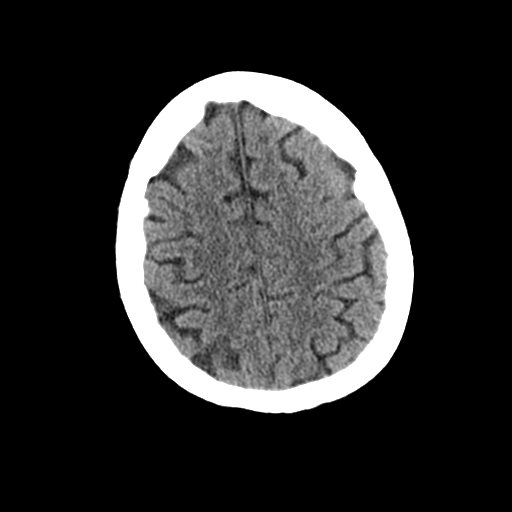
[im 25/31  brain]
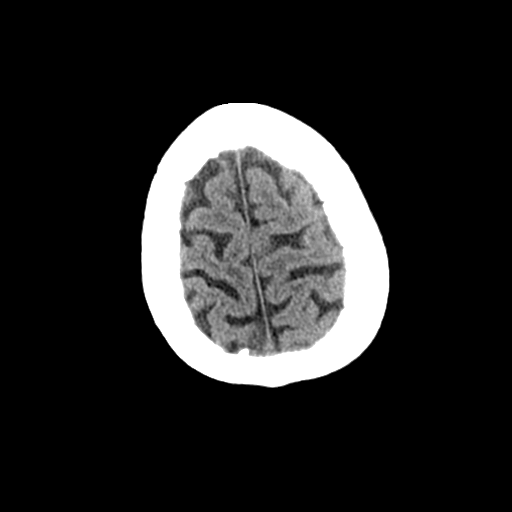
[im 28/31  brain]
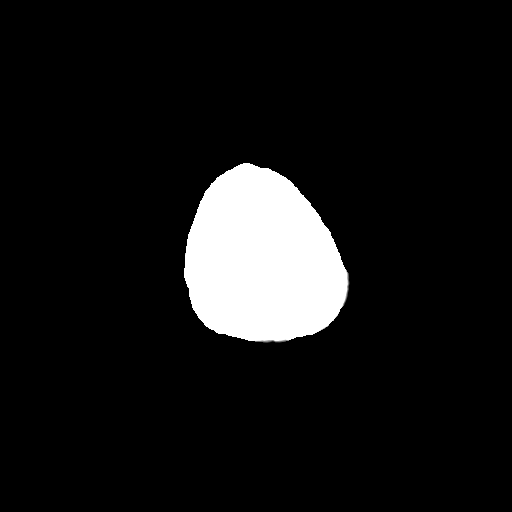
[im 28/31  bone]
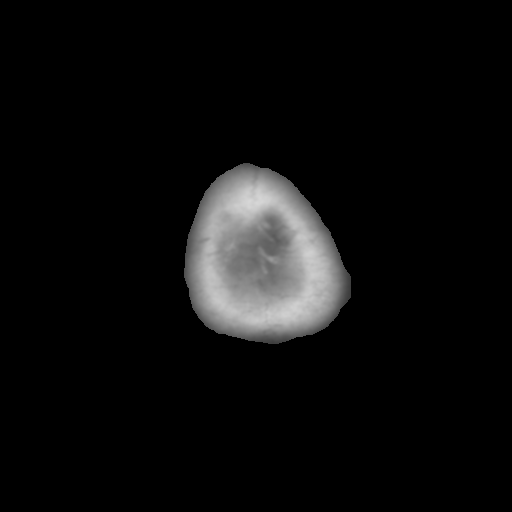

[Series 4: coronal soft · coronal · 0.33mm/px · 3 of 66 slices shown]
[im 22/66  brain]
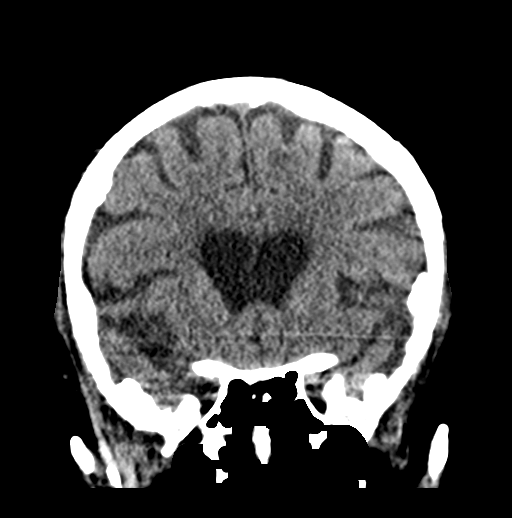
[im 29/66  brain]
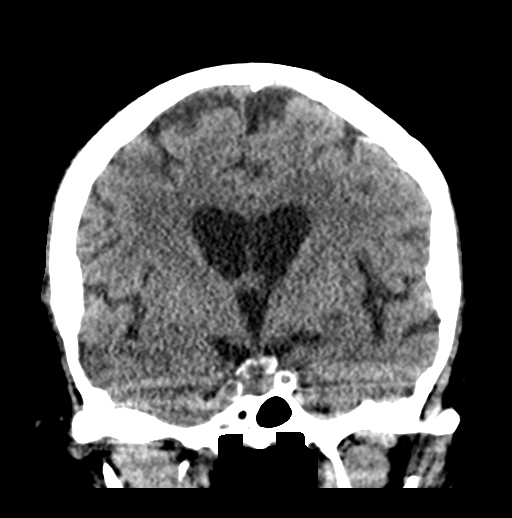
[im 37/66  brain]
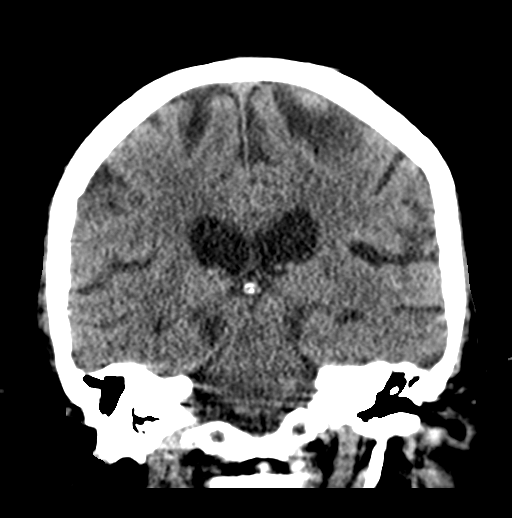

[Series 5: sagittal soft · sagittal · 0.35mm/px · 3 of 67 slices shown]
[im 23/67  brain]
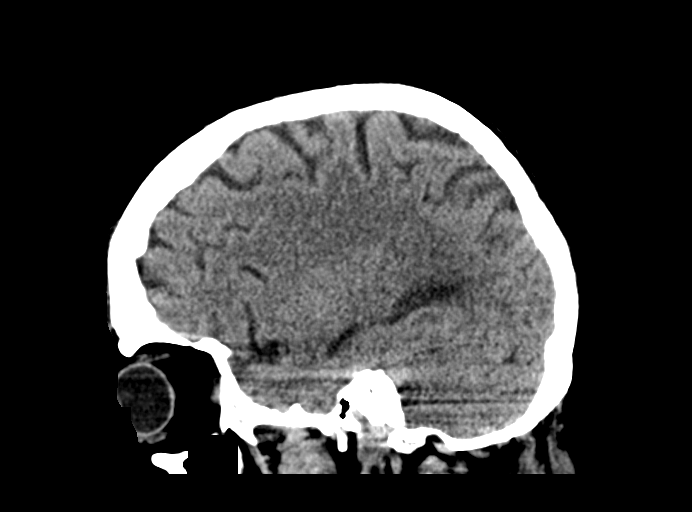
[im 34/67  brain]
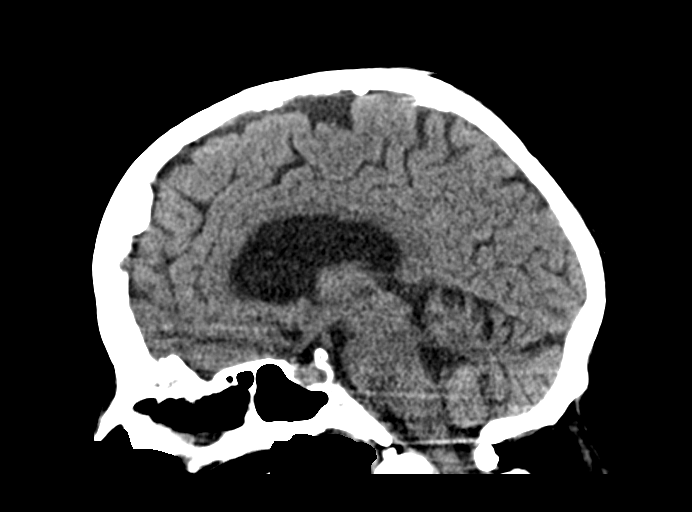
[im 45/67  brain]
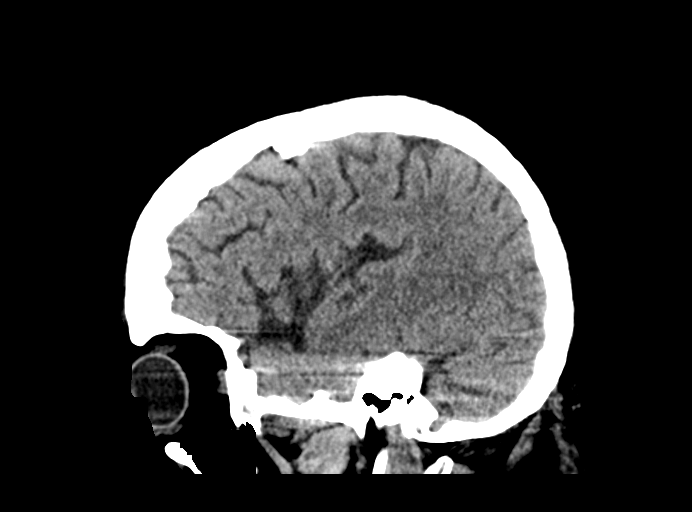

[15 of 47 positions shown; findings below may reference images not displayed]

FINDINGS: Brain: Mild age related cortical and deep atrophy, unchanged.
Mild-to-moderate changes of small vessel disease of the white matter
diffusely, unchanged. No mass lesion. No midline shift. No acute
hemorrhage or hematoma. No extra-axial fluid collections. No
evidence of acute infarction.

Vascular: Moderate BILATERAL carotid siphon atherosclerosis. No
hyperdense vessel.

Skull: Mild changes of hyperostosis frontalis interna. No skull
fracture or other focal osseous abnormality involving the skull.

Sinuses/Orbits: Visualized paranasal sinuses, bilateral mastoid air
cells and bilateral middle ear cavities well-aerated. Frontal
sinuses are hypoplastic. Visualized orbits and globes normal in
appearance.

Other: None.
IMPRESSION: 1. No acute intracranial abnormality.
2. Stable mild age related cortical and deep atrophy and
mild-to-moderate chronic microvascular ischemic changes of the white
matter.

## 2020-04-04 IMAGING — DX DG CHEST 1V PORT
2 series · 2 of 2 positions shown · non-contrast
Comparison: 09/27/2019

CLINICAL DATA: Altered mental status

EXAM:
PORTABLE CHEST 1 VIEW

[chest ap (1 of 2)]
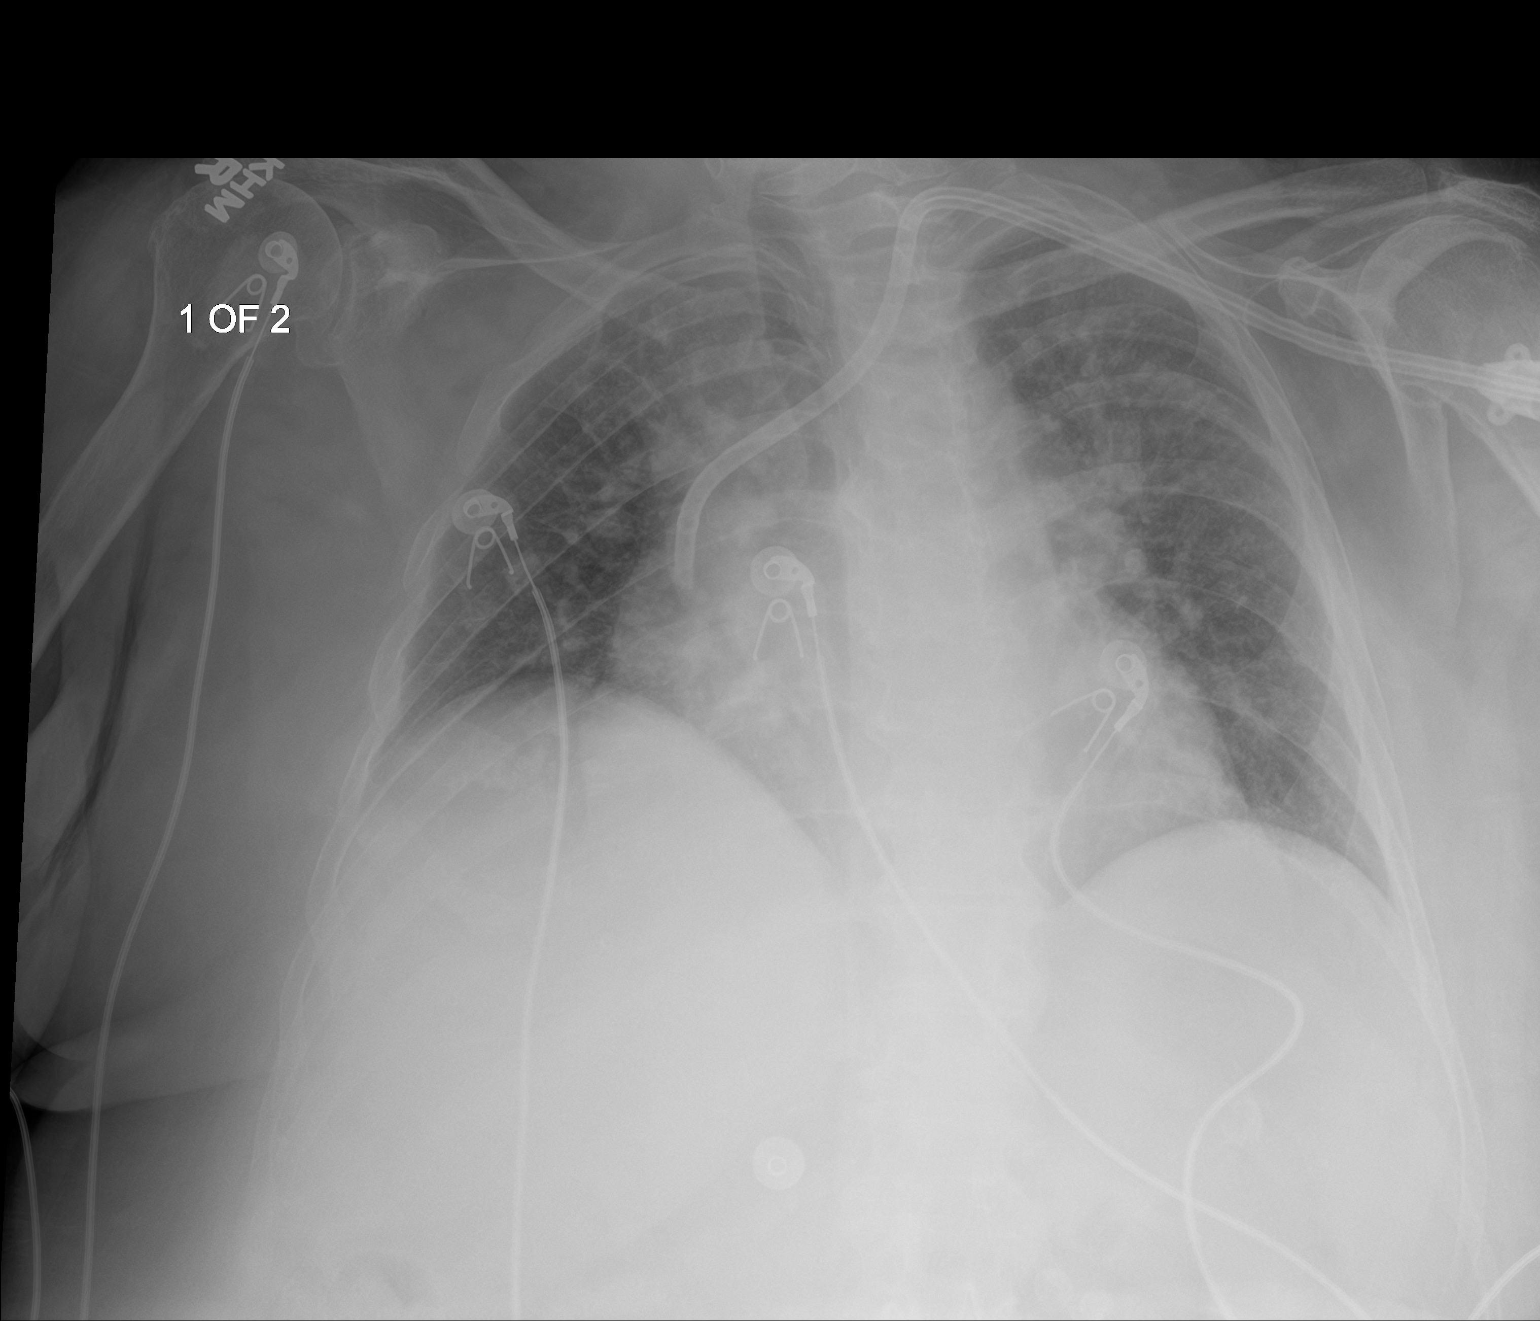

[chest ap (2 of 2)]
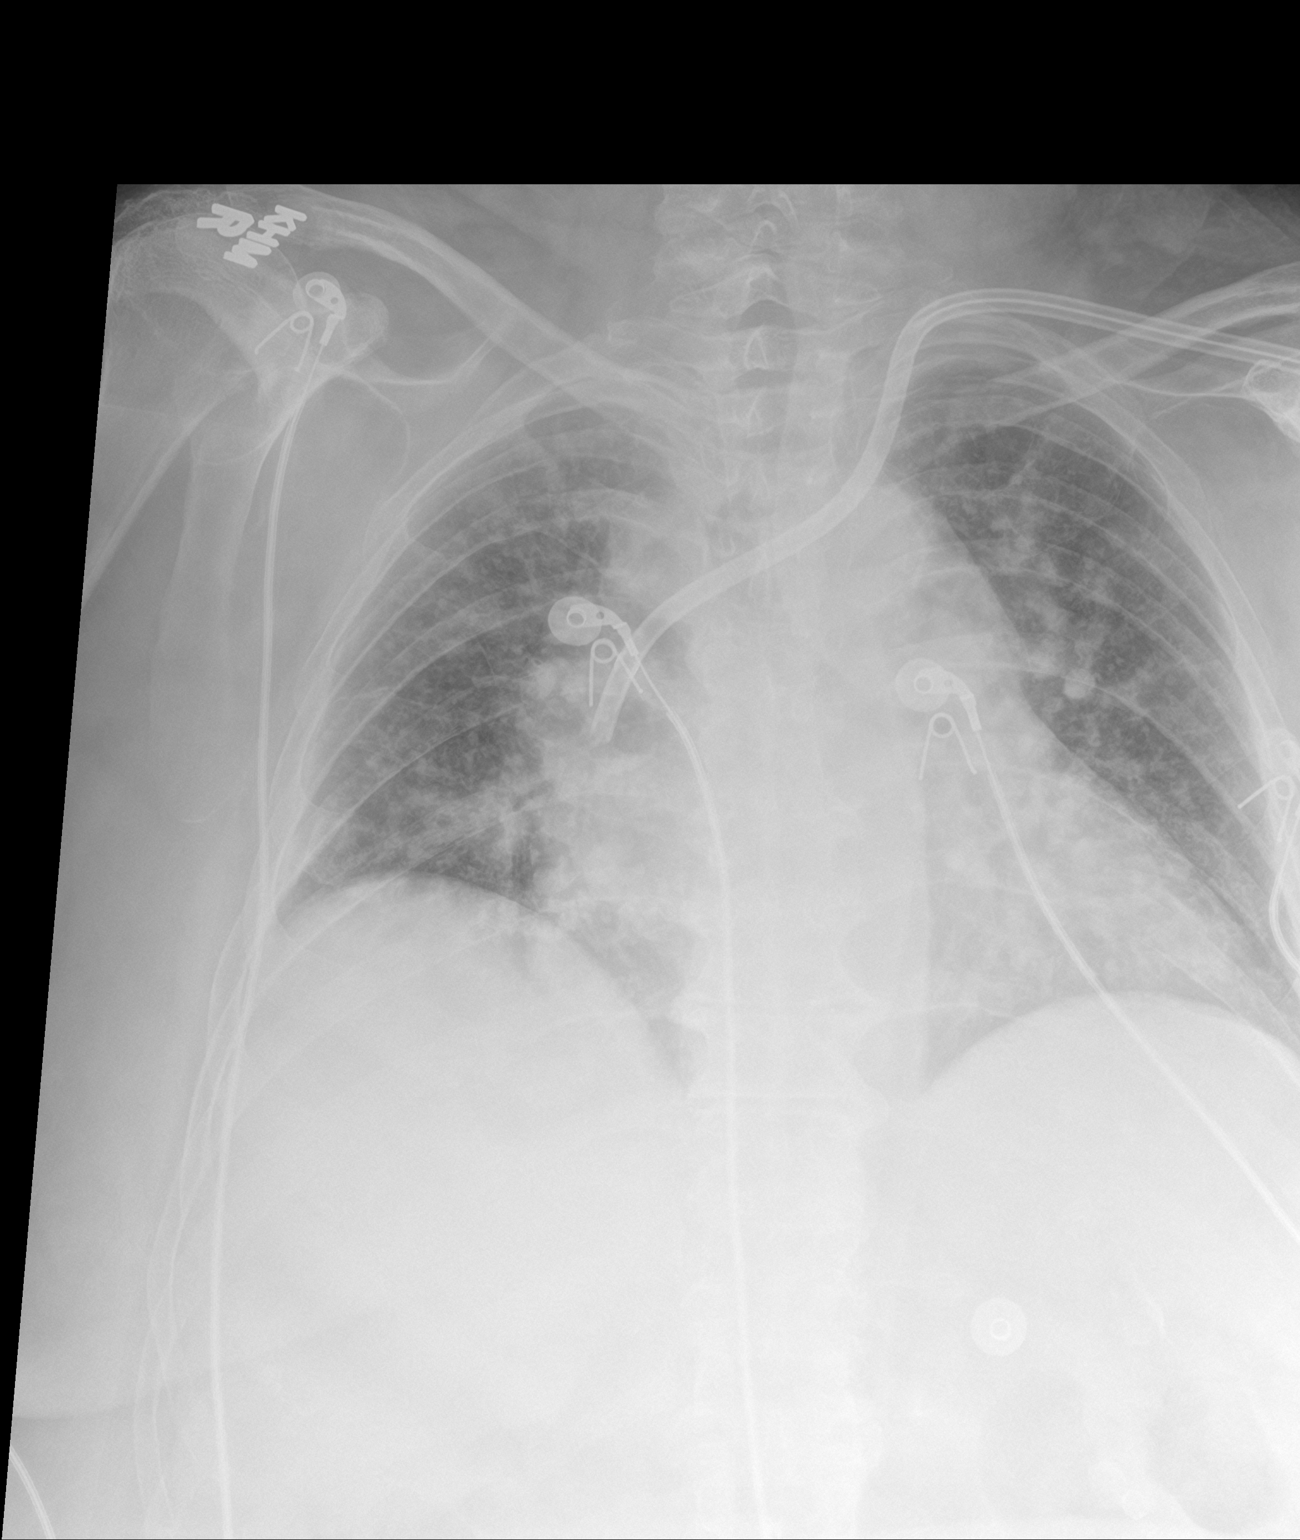

[2 of 2 positions shown; findings below may reference images not displayed]

FINDINGS: Dialysis catheter is unchanged. Patient is significantly rotated.
Low lung volumes with elevation of the right hemidiaphragm.
Pulmonary vascular congestion. No pleural effusion or pneumothorax.
Stable cardiomegaly.
IMPRESSION: Pulmonary vascular congestion.

## 2020-04-16 DEATH — deceased

## 2020-05-05 IMAGING — US BILATERAL CAROTID DUPLEX ULTRASOUND
1 series · 13 of 24 positions shown · non-contrast
Comparison: None.

CLINICAL DATA: Altered mental status, hypertension, hyperlipidemia

EXAM:
BILATERAL CAROTID DUPLEX ULTRASOUND
TECHNIQUE: Gray scale imaging, color Doppler and duplex ultrasound were
performed of bilateral carotid and vertebral arteries in the neck.

[Series 1: bilateral carotid duplex ultrasound · 0.06mm/px · 13 of 73 slices shown]
[im 1/73]
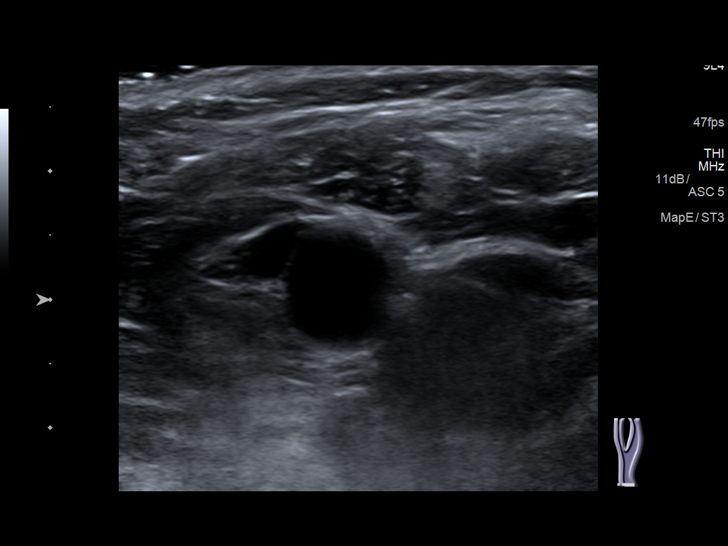
[im 7/73]
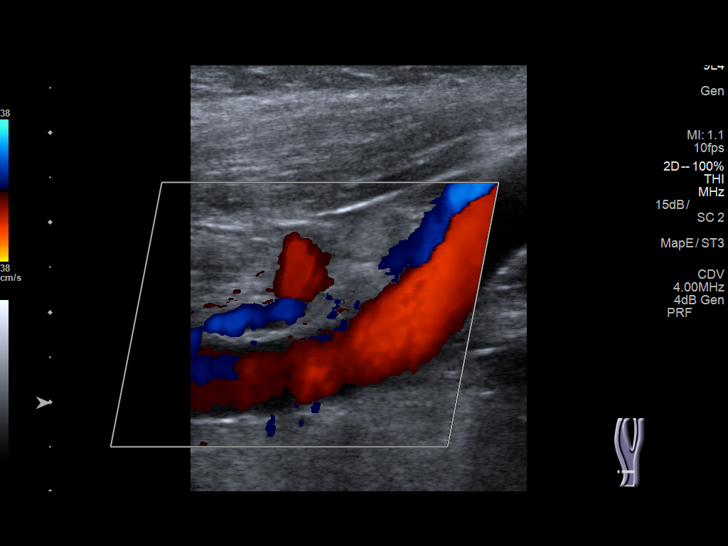
[im 13/73]
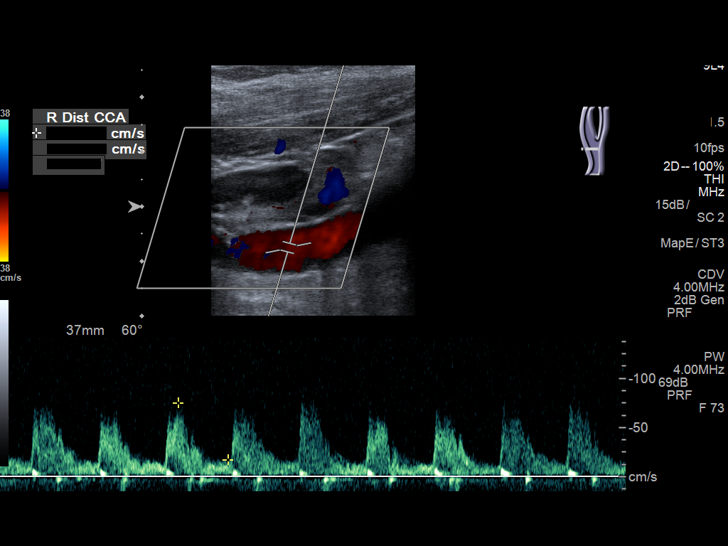
[im 19/73]
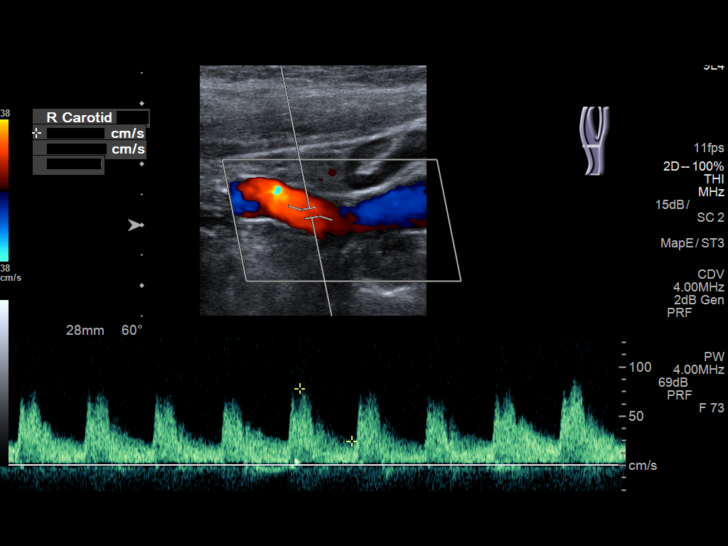
[im 26/73]
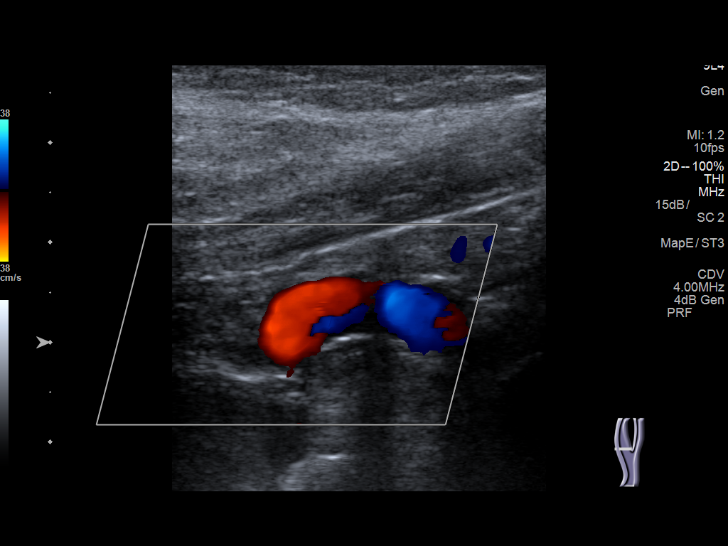
[im 32/73]
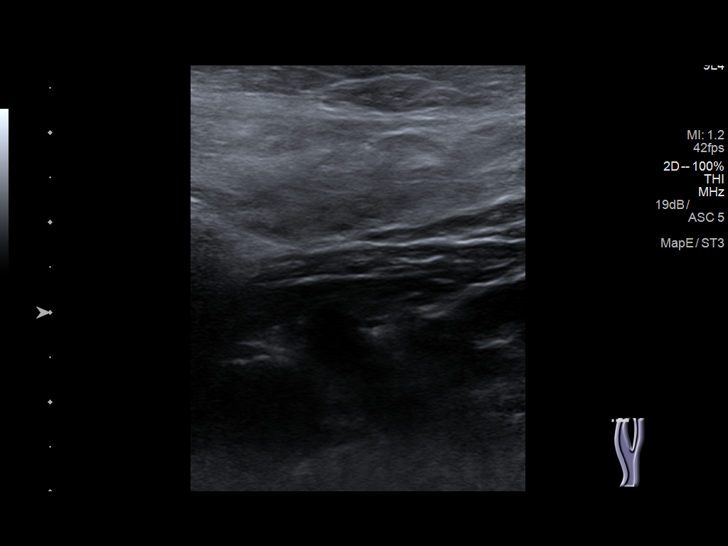
[im 38/73]
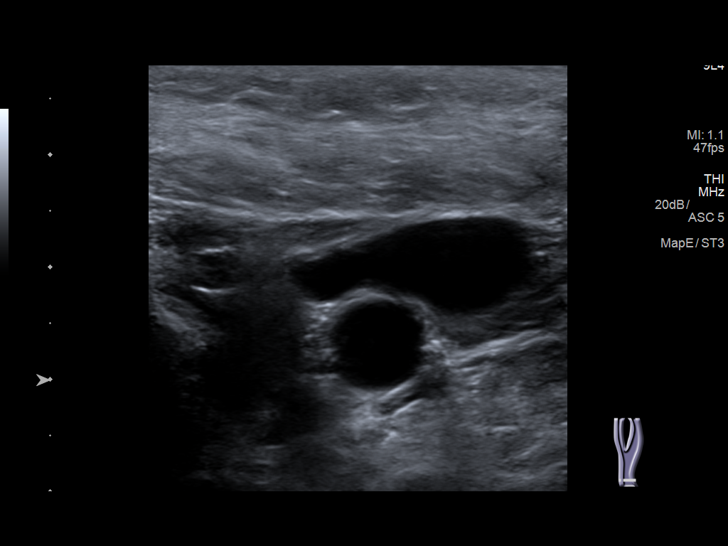
[im 41/73]
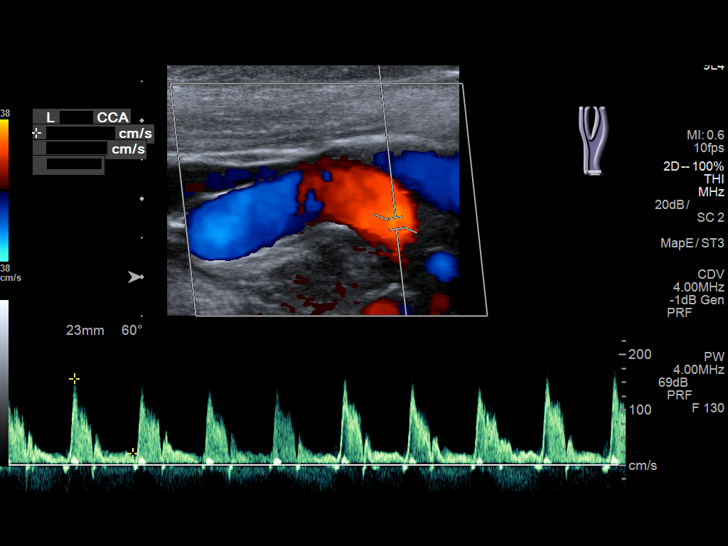
[im 47/73]
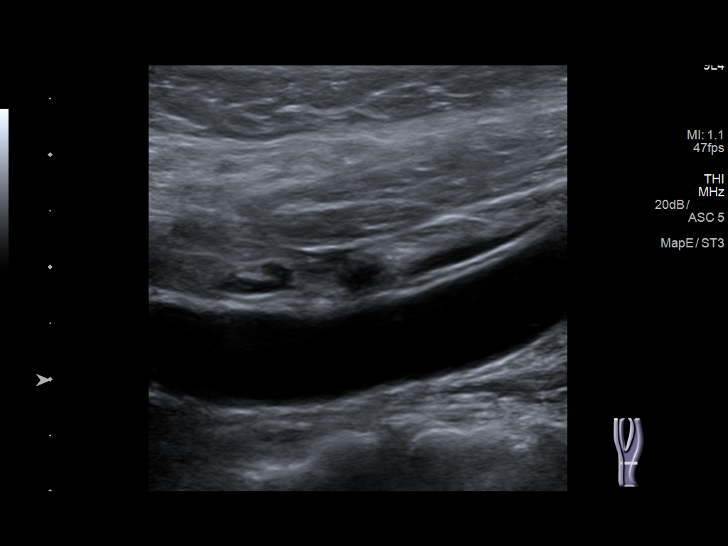
[im 54/73]
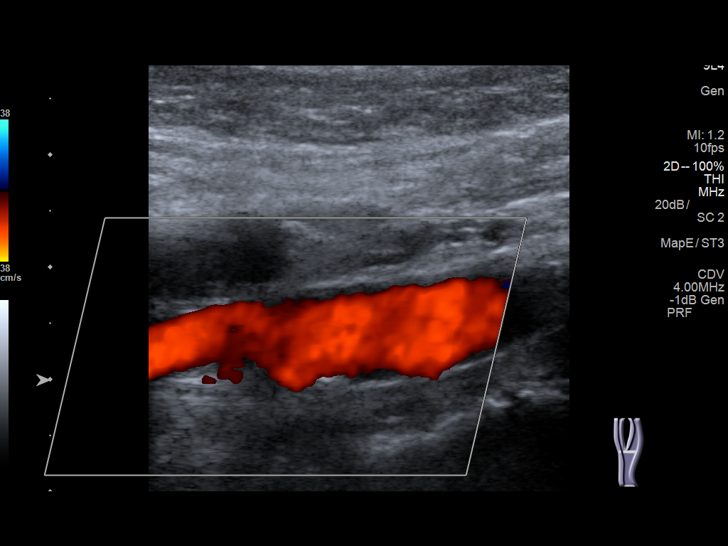
[im 60/73]
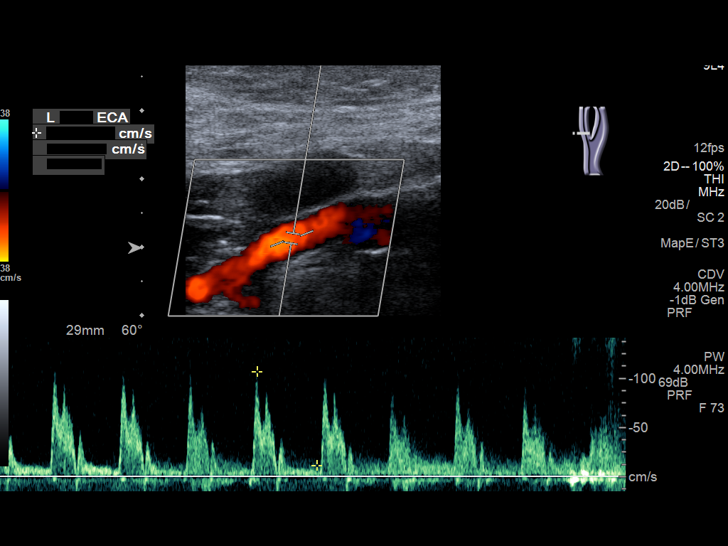
[im 66/73]
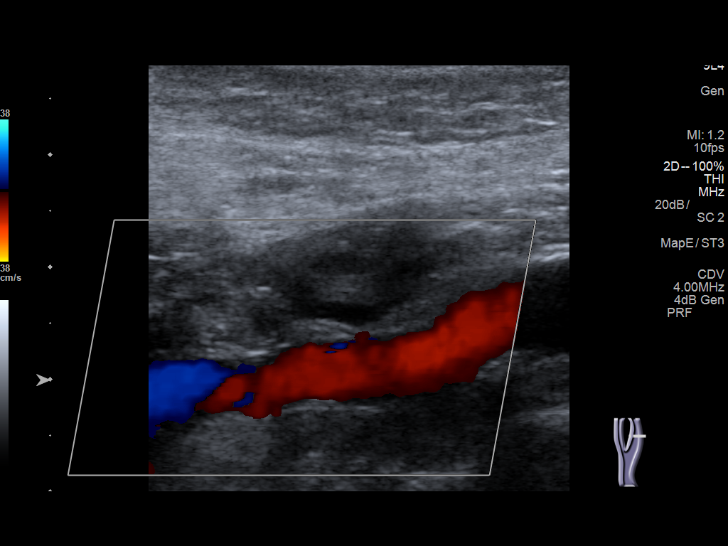
[im 73/73]
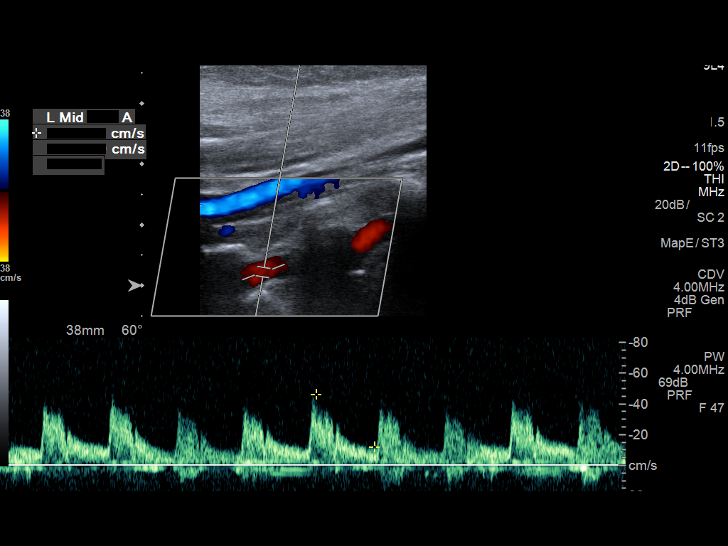

[13 of 24 positions shown; findings below may reference images not displayed]

FINDINGS: Criteria: Quantification of carotid stenosis is based on velocity
parameters that correlate the residual internal carotid diameter
with NASCET-based stenosis levels, using the diameter of the distal
internal carotid lumen as the denominator for stenosis measurement.

The following velocity measurements were obtained:

RIGHT

ICA: 93/23 cm/sec

CCA: 124/28 cm/sec

SYSTOLIC ICA/CCA RATIO:

ECA: 99 cm/sec

LEFT

ICA: 117/32 cm/sec

CCA: 116/22 cm/sec

SYSTOLIC ICA/CCA RATIO:

ECA: 107 cm/sec

RIGHT CAROTID ARTERY: No significant atherosclerotic plaque
formation. No hemodynamically significant right ICA stenosis,
velocity elevation, or turbulent flow. Degree of narrowing less than
50%.

RIGHT VERTEBRAL ARTERY:  Antegrade

LEFT CAROTID ARTERY: minor echogenic plaque formation. No
hemodynamically significant left ICA stenosis, velocity elevation,
or turbulent flow.

LEFT VERTEBRAL ARTERY:  Antegrade
IMPRESSION: Minor carotid atherosclerosis. No hemodynamically significant ICA
stenosis. Degree of narrowing less than 50% bilaterally by
ultrasound criteria.

Patent antegrade vertebral flow bilaterally

## 2020-05-06 IMAGING — DX DG CHEST 1V PORT
1 series · 1 of 1 positions shown · non-contrast
Comparison: 10/21/2019 and 09/27/2019 chest radiographs.

CLINICAL DATA: Tachycardia.

EXAM:
PORTABLE CHEST 1 VIEW

[chest ap]
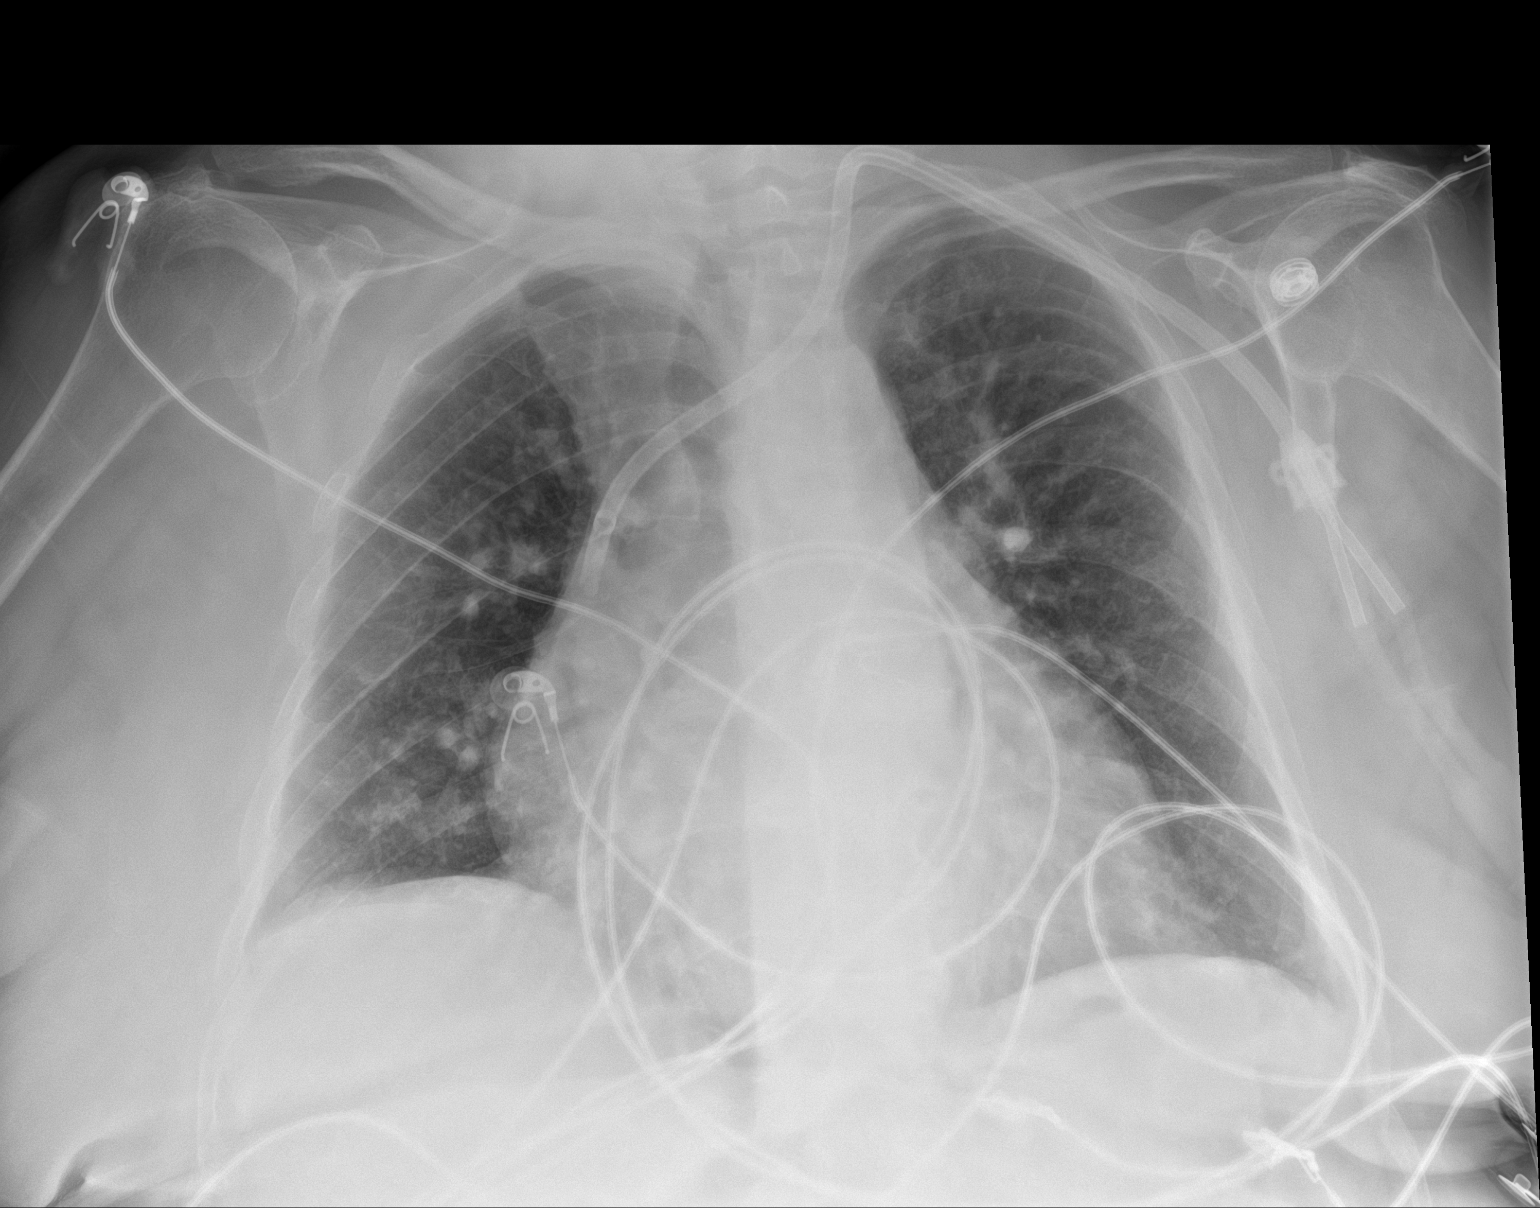

[1 of 1 positions shown; findings below may reference images not displayed]

FINDINGS: Tunneled left IJ central venous catheter tip at the superior
cavoatrial junction. Minimal patchy left basilar opacities likely
reflect atelectasis. No pneumothorax or pleural effusion. Decreased
conspicuity of pulmonary vascular congestion and right hemidiaphragm
elevation.

Grossly unchanged cardiomegaly, accentuated by AP technique and mild
lung hypoinflation.
IMPRESSION: Decreased conspicuity of pulmonary vascular congestion.

Left basilar atelectasis.

Stable cardiomegaly.

## 2020-07-07 IMAGING — CT CT CHEST W/O CM
2 of 7 series · 13 of 36 positions shown, 16 images · non-contrast
Comparison: September 08, 2019

CLINICAL DATA: Pneumonia.

EXAM:
CT CHEST WITHOUT CONTRAST
TECHNIQUE: Multidetector CT imaging of the chest was performed following the
standard protocol without IV contrast.

[Series 2: routine chest without · axial · non-contrast · 0.70mm/px · z∈[+1261,+1491]mm · 12 of 137 slices shown, 15 images]
[im 11/137  mediastinal]
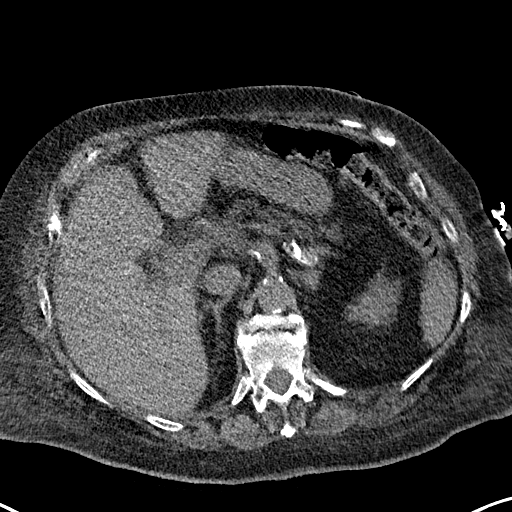
[im 11/137  lung]
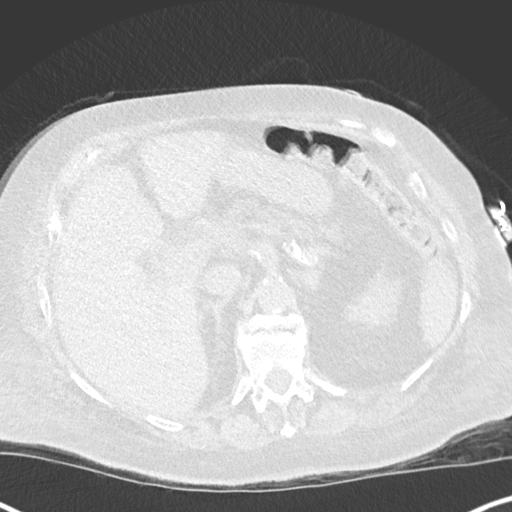
[im 21/137  lung]
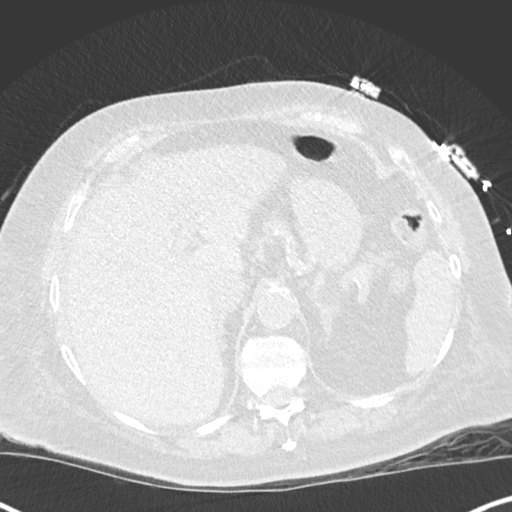
[im 32/137  lung]
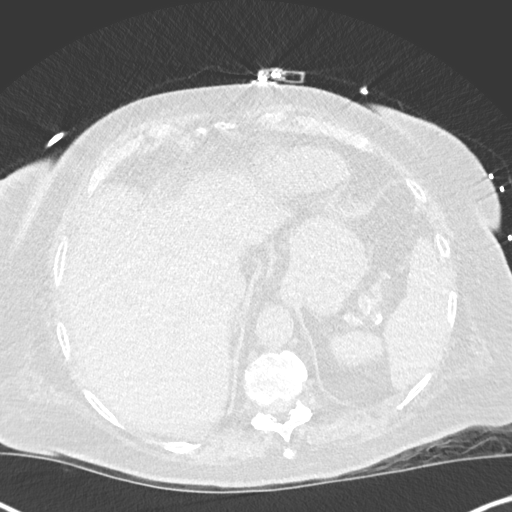
[im 42/137  lung]
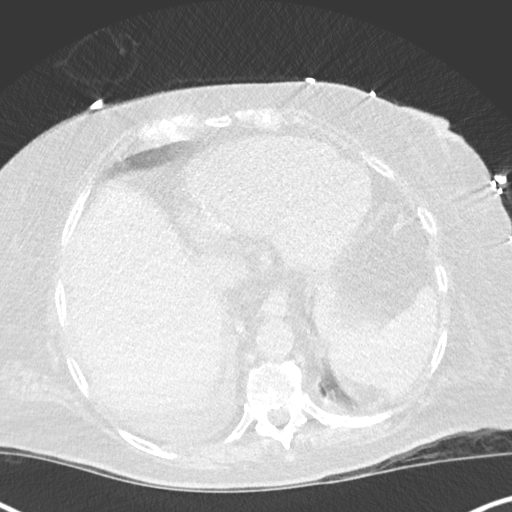
[im 53/137  mediastinal]
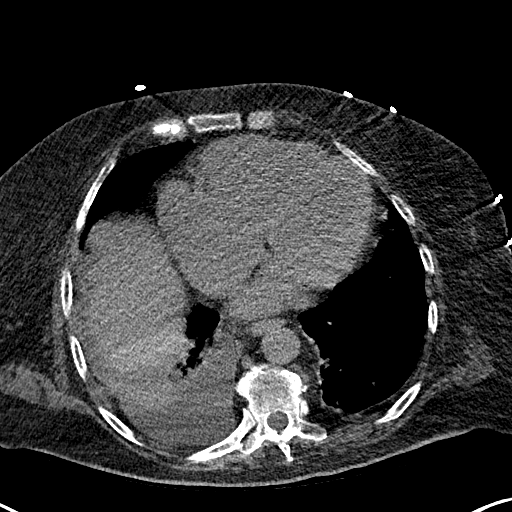
[im 53/137  lung]
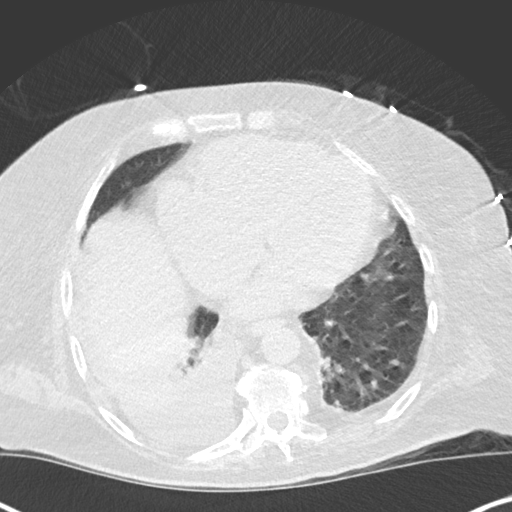
[im 63/137  lung]
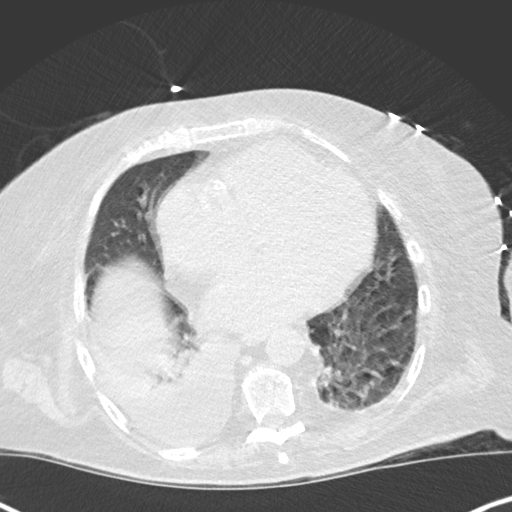
[im 74/137  lung]
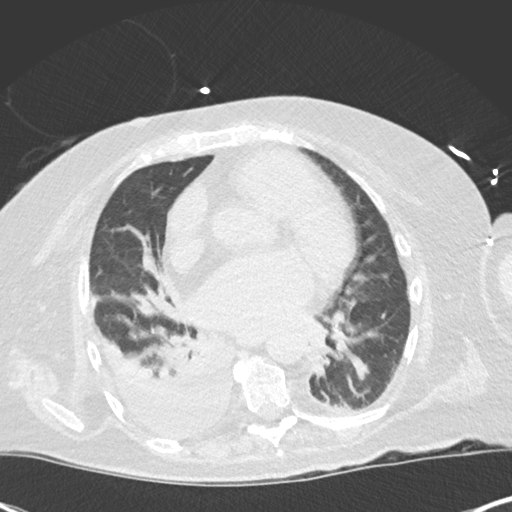
[im 84/137  lung]
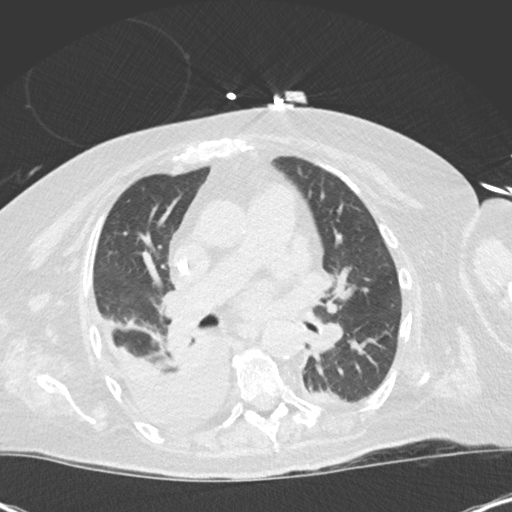
[im 95/137  mediastinal]
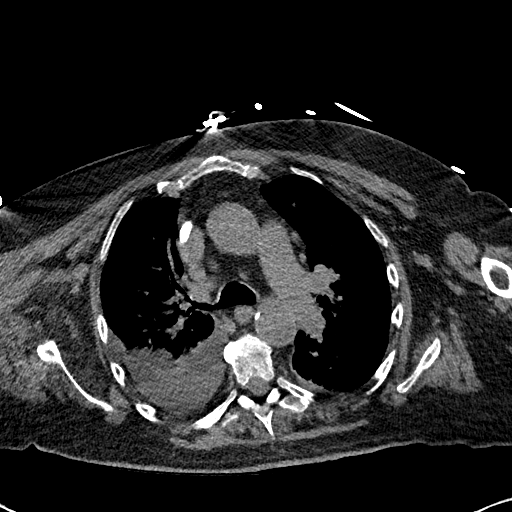
[im 95/137  lung]
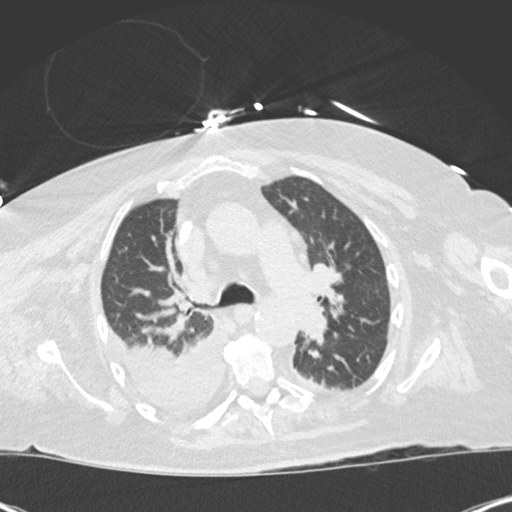
[im 105/137  lung]
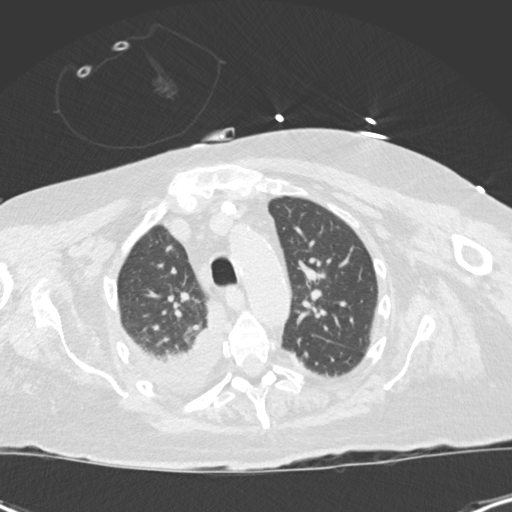
[im 116/137  lung]
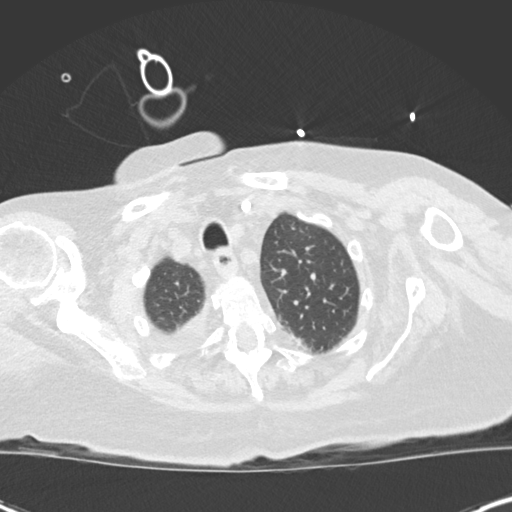
[im 126/137  lung]
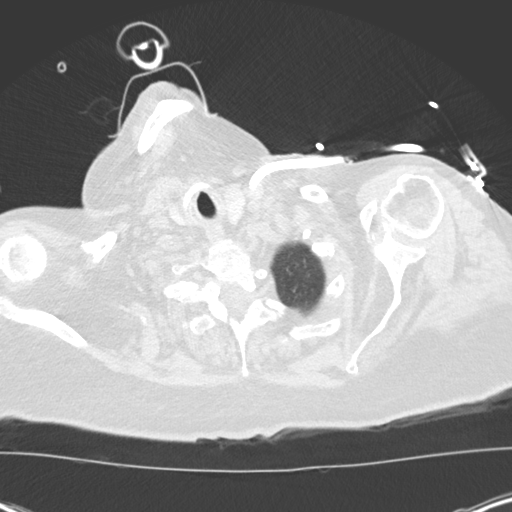

[Series 5: coronal · coronal · 0.57mm/px · 1 of 142 slices shown]
[im 71/142  lung]
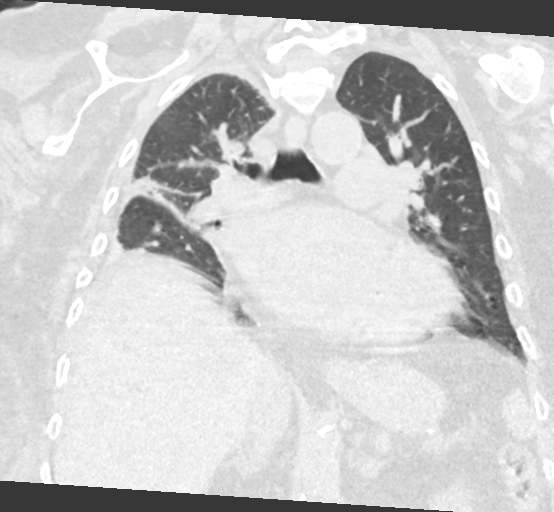

[13 of 36 positions shown; findings below may reference images not displayed]

FINDINGS: Cardiovascular: The heart size is enlarged. There are coronary
artery calcifications. Aortic calcifications are noted. There is a
well-positioned tunneled dialysis catheter. There is no significant
pericardial effusion.

Mediastinum/Nodes:

--No mediastinal or hilar lymphadenopathy.

--No axillary lymphadenopathy.

--No supraclavicular lymphadenopathy.

--Normal thyroid gland.

--The esophagus is unremarkable

Lungs/Pleura: There is a moderate-sized right-sided pleural adjacent
atelectasis. There is a trace left-sided pleural effusion. There is
a amount of atelectasis at left base. There is no pneumothorax.
Trachea is unremarkable.

Upper Abdomen: No acute abnormality.

Musculoskeletal: Appearance of the sternal body is likely secondary
to motion artifact as opposed to a mildly displaced fracture. There
is no acute displaced fracture identified on this study.

Review of the MIP images confirms the above findings.
IMPRESSION: 1. Moderate-sized right-sided pleural effusion with adjacent
atelectasis.
2. Cardiomegaly with coronary artery disease.

Aortic Atherosclerosis ([id number].J).

## 2020-07-07 IMAGING — DX DG CHEST 1V PORT
1 series · 1 of 1 positions shown · non-contrast
Comparison: Portable exam 4381 hours compared to 11/22/2019

CLINICAL DATA: Hypoxia, shortness of breath and cough today,
diabetes mellitus, end-stage renal disease, CHF, asthma, former
smoker, history pulmonary embolism

EXAM:
PORTABLE CHEST 1 VIEW

[chest ap]
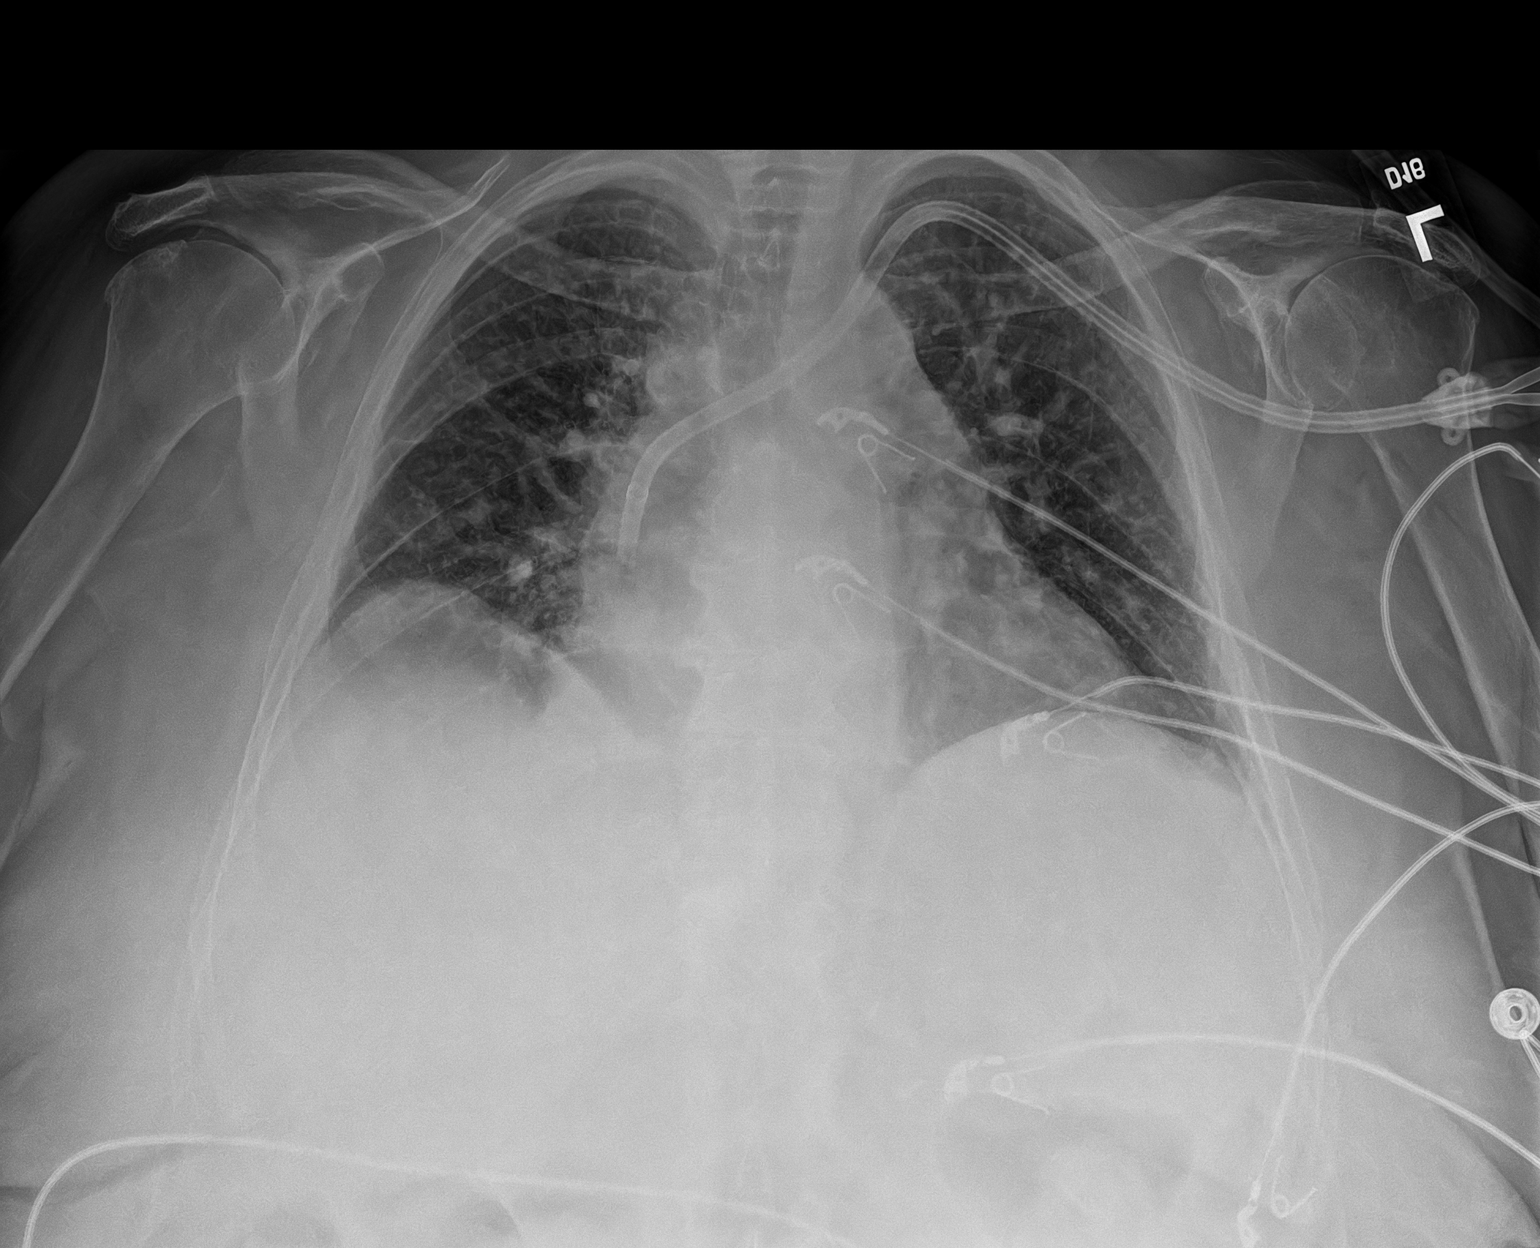

[1 of 1 positions shown; findings below may reference images not displayed]

FINDINGS: LEFT jugular central venous catheter with tip projecting over SVC
near cavoatrial junction.

Enlargement of cardiac silhouette with pulmonary vascular
congestion.

Mediastinal contours normal.

Minimal atelectasis RIGHT base.

No definite infiltrate, pleural effusion or pneumothorax.

Bones demineralized.
IMPRESSION: Enlargement of cardiac silhouette with minimal RIGHT basilar
atelectasis.

No acute infiltrate.

## 2020-08-20 IMAGING — DX DG CHEST 1V PORT
1 series · 1 of 1 positions shown · non-contrast
Comparison: 01/23/2020

CLINICAL DATA: Seizure, short of breath, asthma

EXAM:
PORTABLE CHEST 1 VIEW

[chest ap grid]
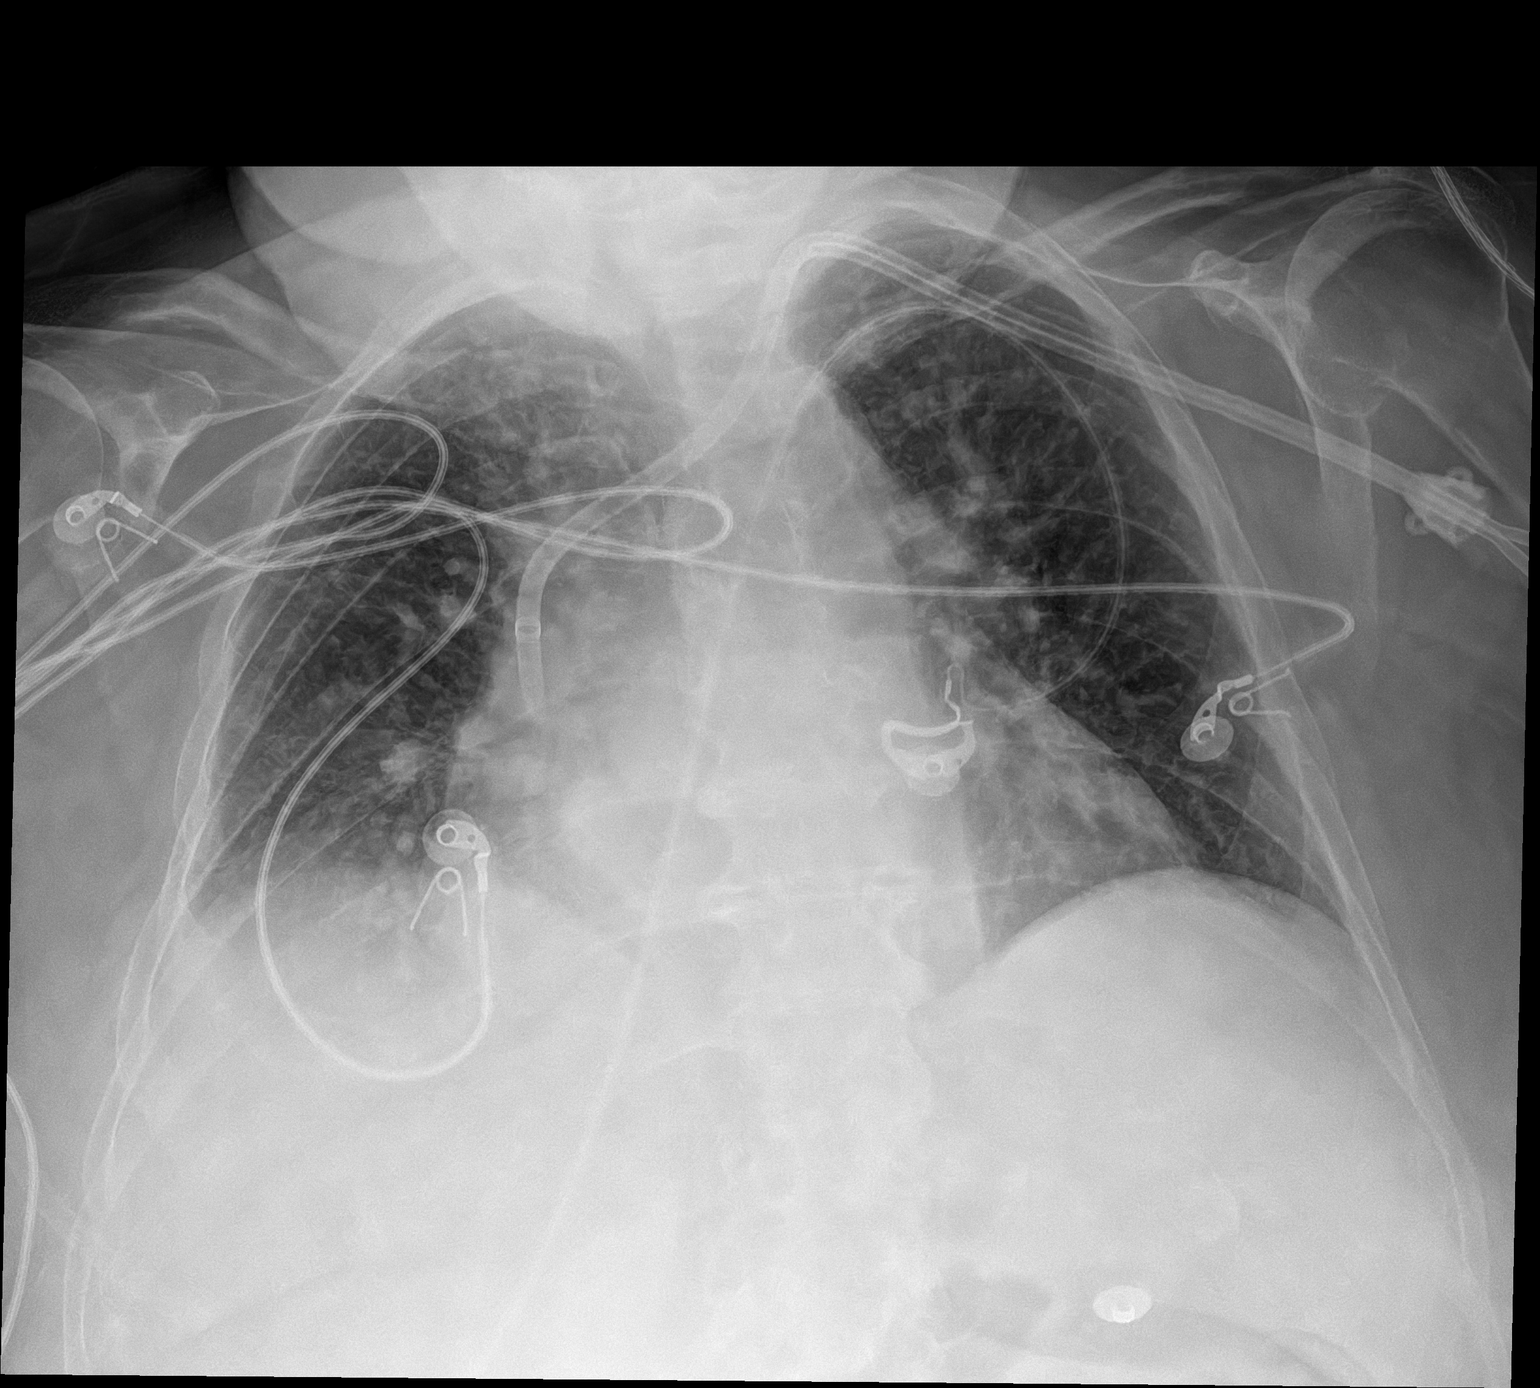

[1 of 1 positions shown; findings below may reference images not displayed]

FINDINGS: Single frontal view of the chest demonstrates stable left internal
jugular dialysis catheter. Cardiac silhouette is enlarged but
stable. There is chronic central vascular congestion, without acute
airspace disease. Trace right pleural effusion. No pneumothorax.
IMPRESSION: 1. Vascular congestion without overt edema.
2. Trace right pleural effusion.

## 2020-08-20 IMAGING — CT CT HEAD W/O CM
3 series · 15 of 47 positions shown, 18 images · non-contrast
Comparison: 10/21/2019

CLINICAL DATA: Delirium.  Possible seizure.  Rightward gaze.

EXAM:
CT HEAD WITHOUT CONTRAST
TECHNIQUE: Contiguous axial images were obtained from the base of the skull
through the vertex without intravenous contrast.

[Series 2: head w o · axial · 0.42mm/px · z∈[+31,+161]mm · 9 of 32 slices shown, 12 images]
[im 3/32  brain]
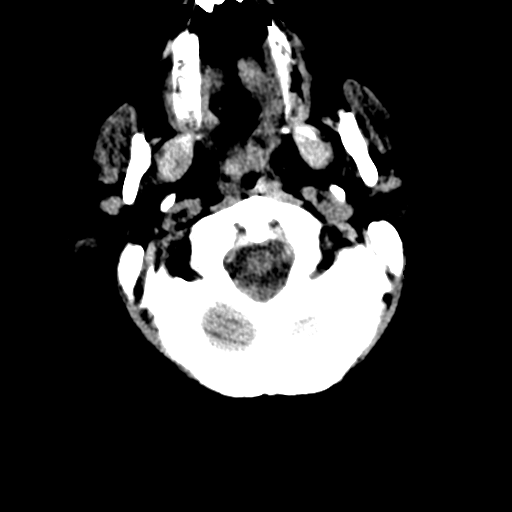
[im 3/32  bone]
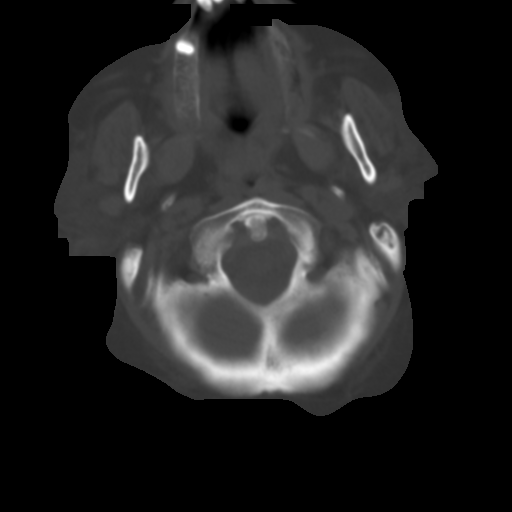
[im 6/32  brain]
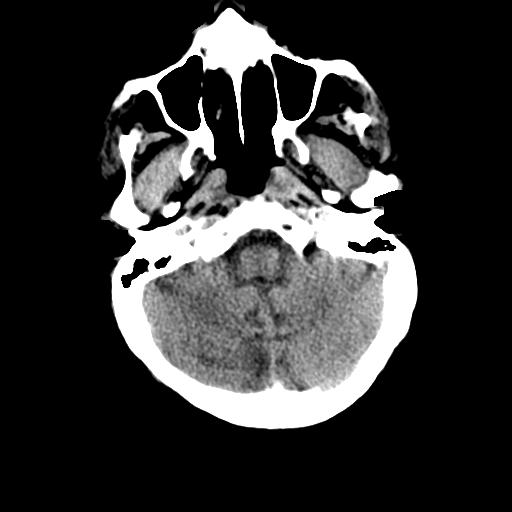
[im 9/32  brain]
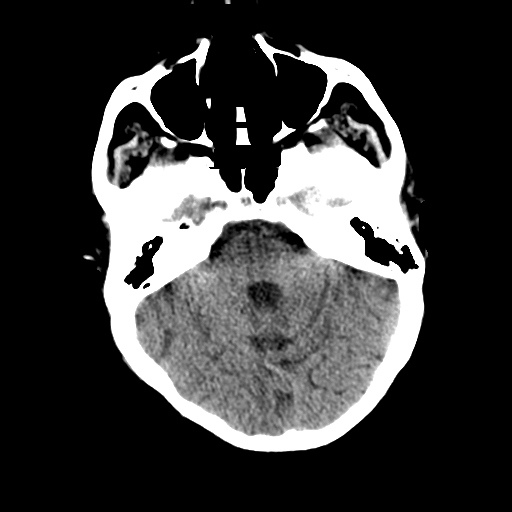
[im 12/32  brain]
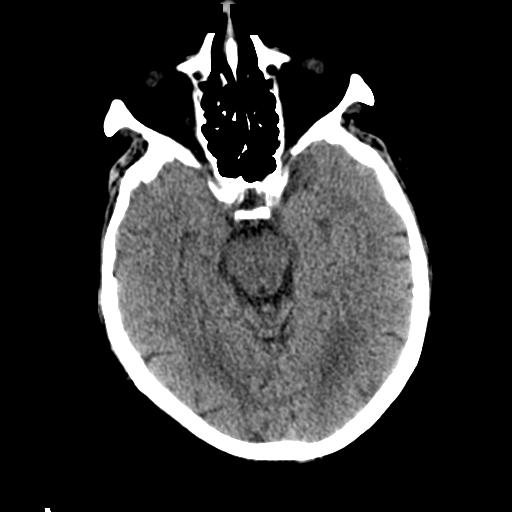
[im 17/32  brain]
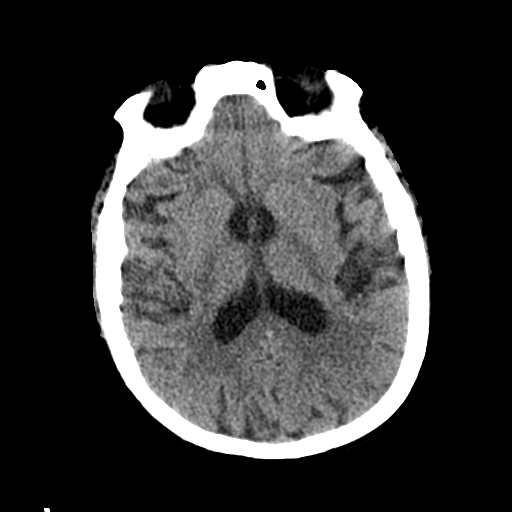
[im 17/32  bone]
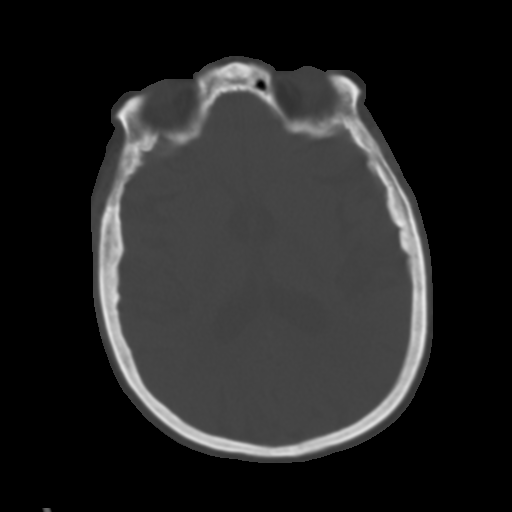
[im 20/32  brain]
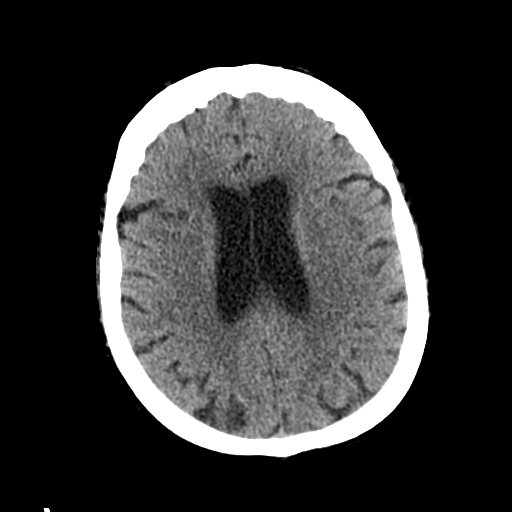
[im 23/32  brain]
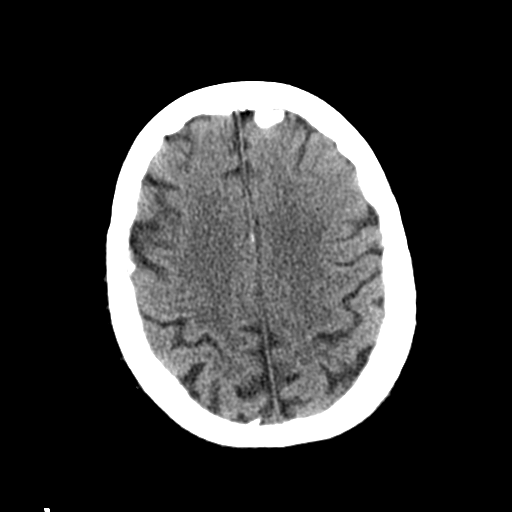
[im 26/32  brain]
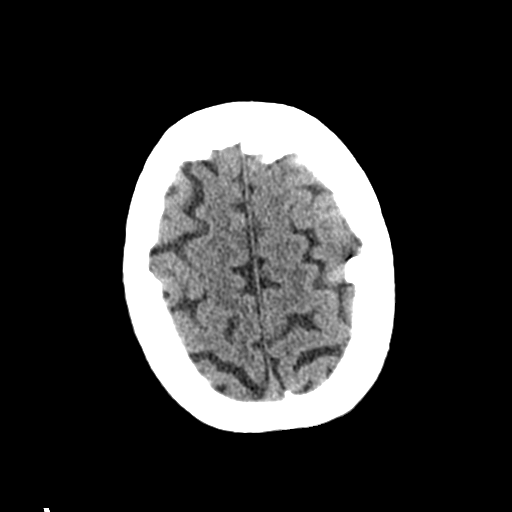
[im 29/32  brain]
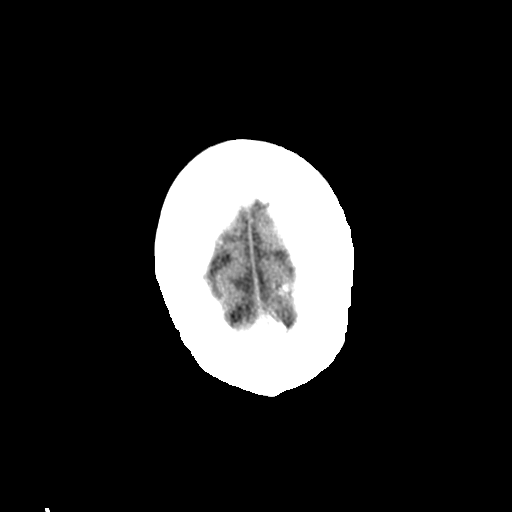
[im 29/32  bone]
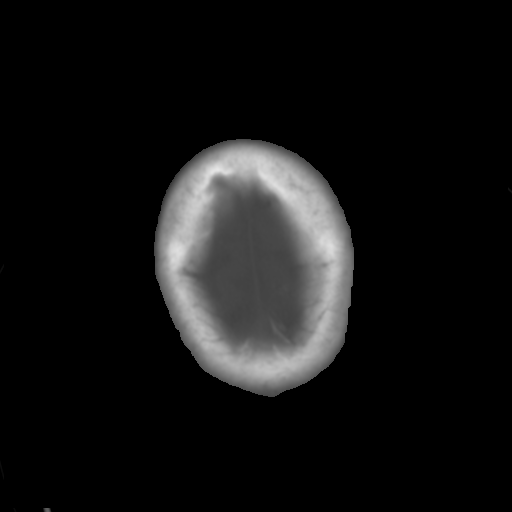

[Series 4: coronal soft · coronal · 0.31mm/px · 3 of 69 slices shown]
[im 23/69  brain]
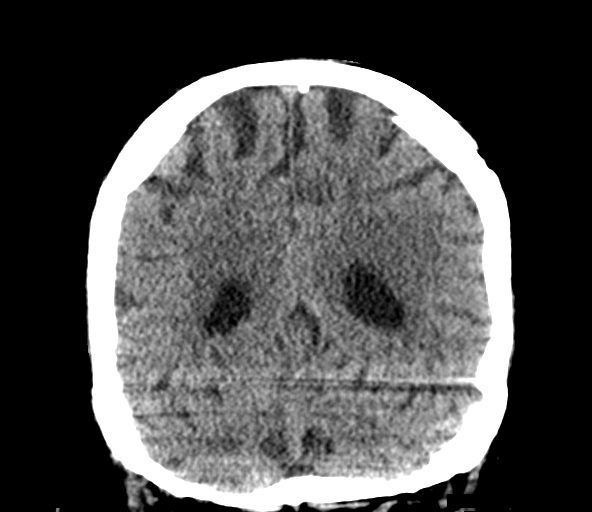
[im 31/69  brain]
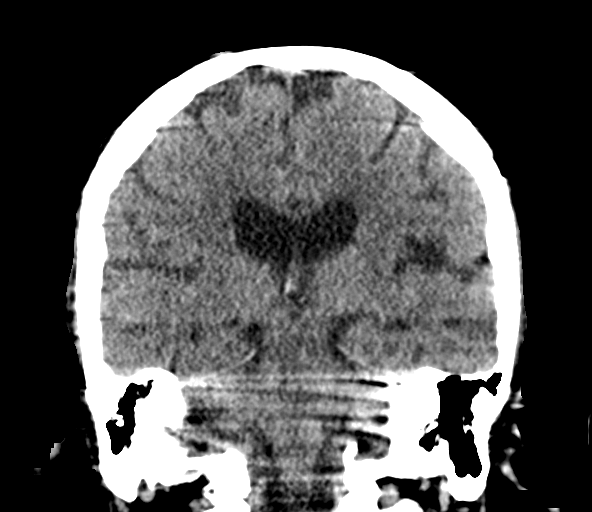
[im 38/69  brain]
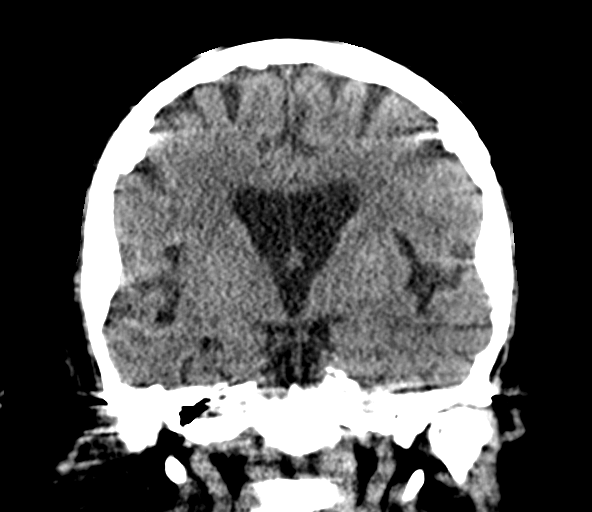

[Series 5: sagittal soft · sagittal · 0.31mm/px · 3 of 50 slices shown]
[im 17/50  brain]
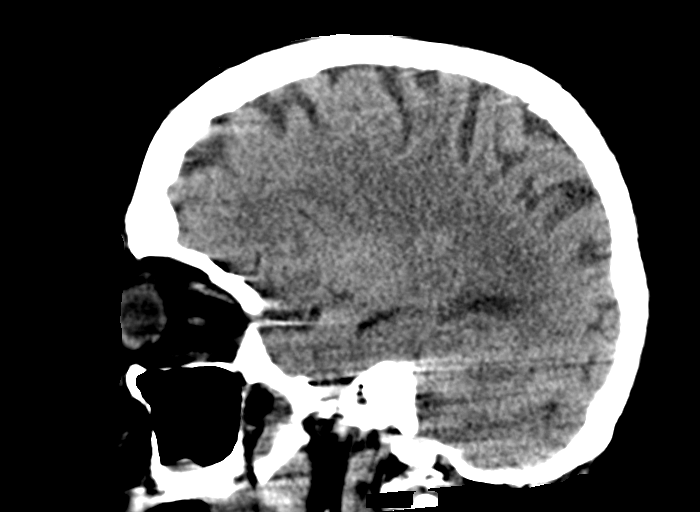
[im 25/50  brain]
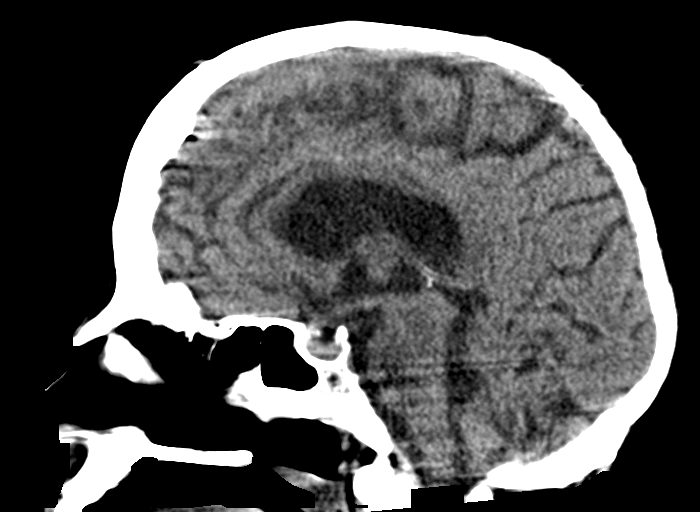
[im 33/50  brain]
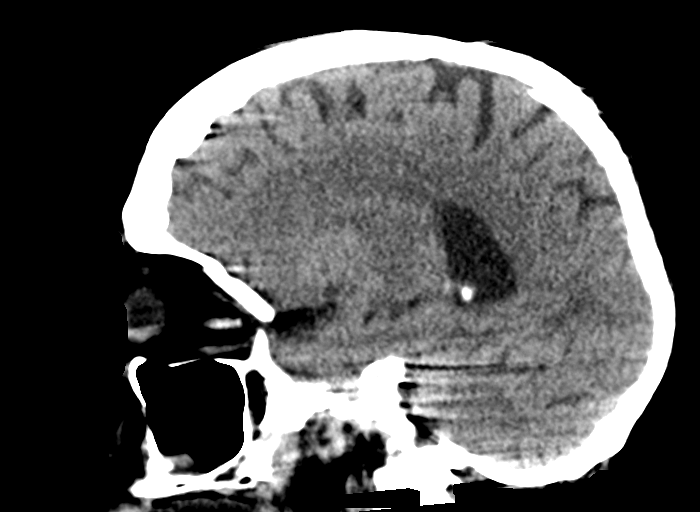

[15 of 47 positions shown; findings below may reference images not displayed]

FINDINGS: Brain: There is no evidence of acute infarct, intracranial
hemorrhage, mass, midline shift, or extra-axial fluid collection.
Mild cerebral atrophy is unchanged. Cerebral white matter
hypodensities are also unchanged and nonspecific but compatible with
mild chronic small vessel ischemic disease.

Vascular: Calcified atherosclerosis at the skull base. No hyperdense
vessel.

Skull: No fracture or suspicious osseous lesion.

Sinuses/Orbits: Mild polypoid mucosal thickening and/or small mucous
retention cysts in the maxillary sinuses. Clear mastoid air cells.
Bilateral cataract extraction.

Other: None.
IMPRESSION: 1. No evidence of acute intracranial abnormality.
2. Mild chronic small vessel ischemic disease.

## 2020-08-23 IMAGING — DX DG CHEST 1V PORT
1 series · 1 of 1 positions shown · non-contrast
Comparison: 03/07/2020

CLINICAL DATA: Short of breath, chest pain

EXAM:
PORTABLE CHEST 1 VIEW

[chest ap]
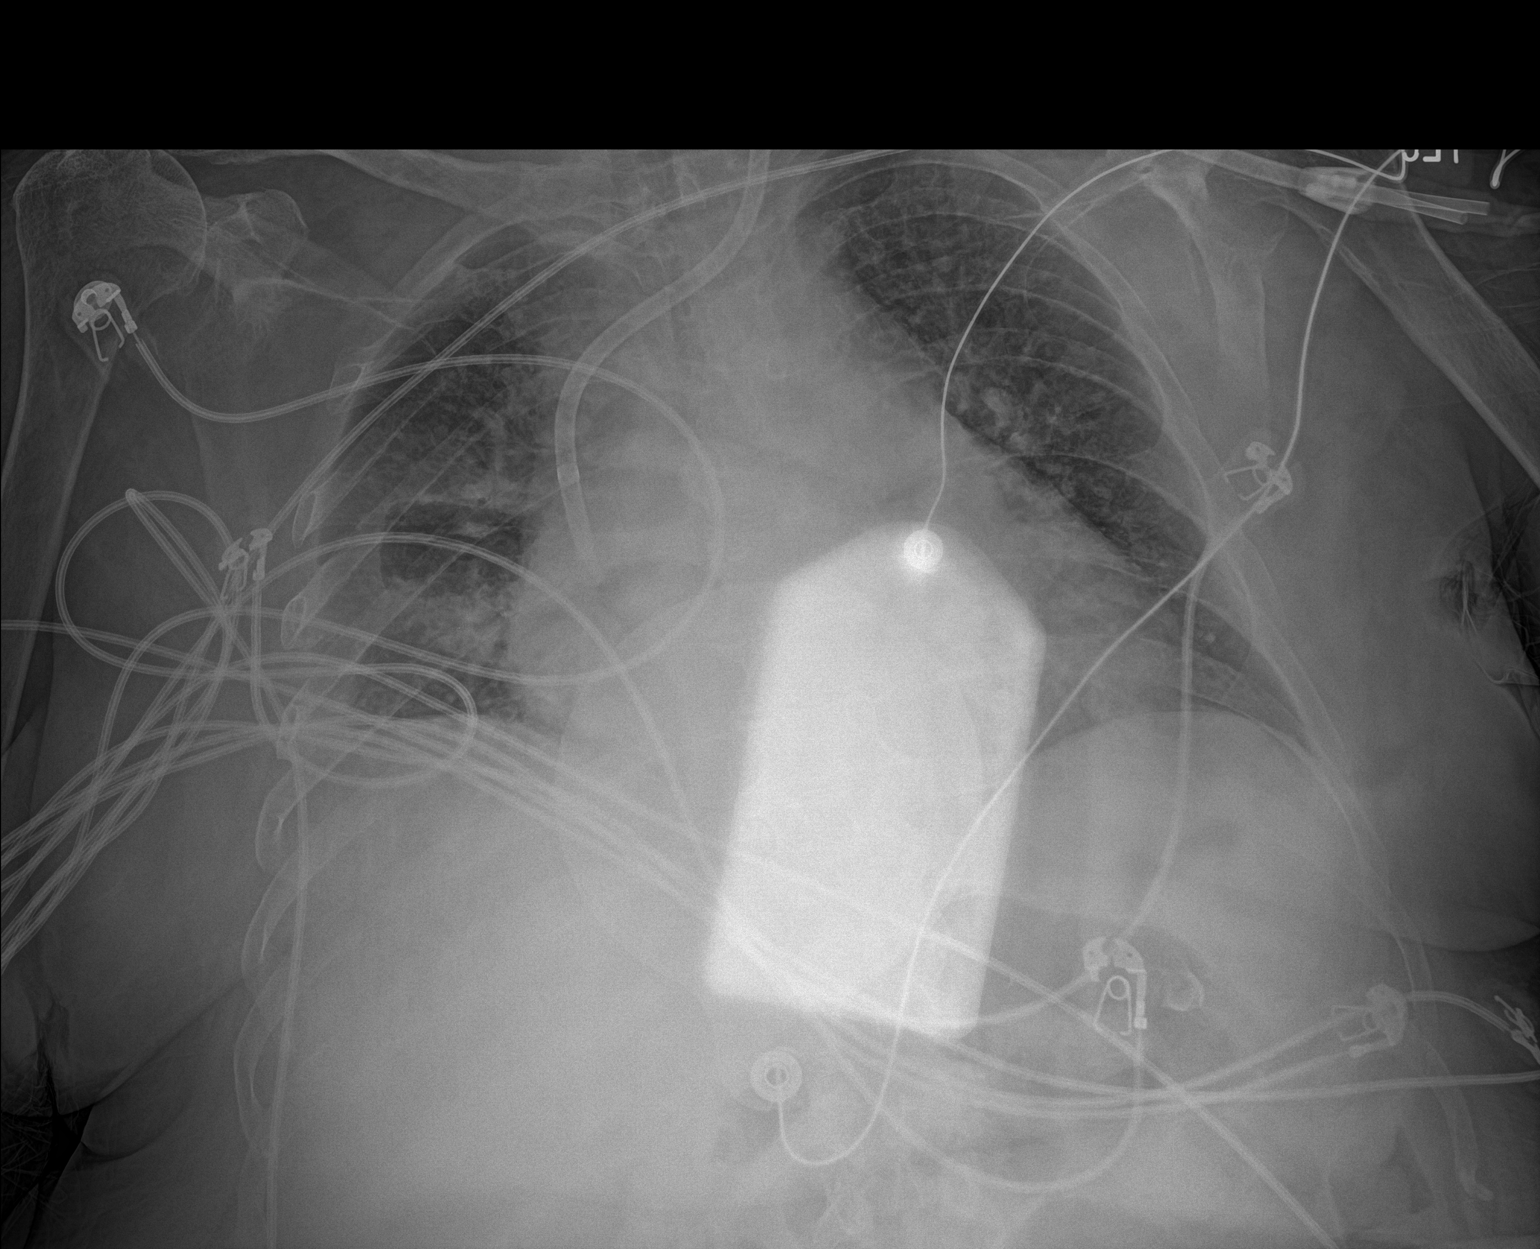

[1 of 1 positions shown; findings below may reference images not displayed]

FINDINGS: Cardiac enlargement with vascular congestion and mild interstitial
edema. Mild right lower lobe patchy airspace disease and small right
effusion.

Left jugular central venous catheter tip in the SVC unchanged. No
pneumothorax.
IMPRESSION: Cardiac enlargement with mild progression of vascular congestion and
probable interstitial edema. Small right effusion. Mild progression
of right lower lobe airspace disease.

## 2020-08-23 IMAGING — US US ABDOMEN LIMITED
1 series · 14 of 16 positions shown · non-contrast
Comparison: None.

CLINICAL DATA: Elevated LFTs

EXAM:
ULTRASOUND ABDOMEN LIMITED RIGHT UPPER QUADRANT

[Series 1: us abdomen limited ruq · 14 of 16 slices shown]
[im 1/16]
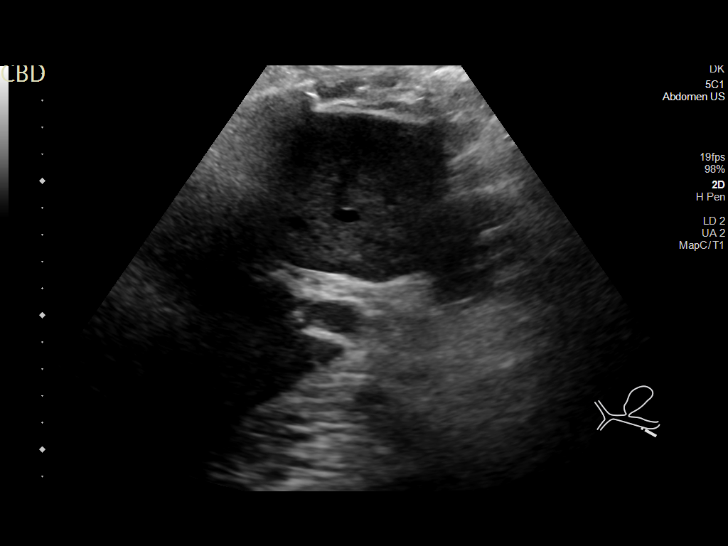
[im 2/16]
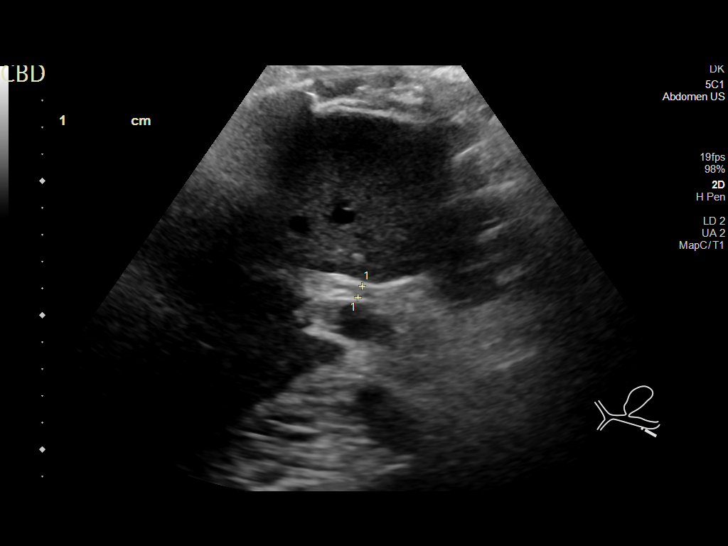
[im 3/16]
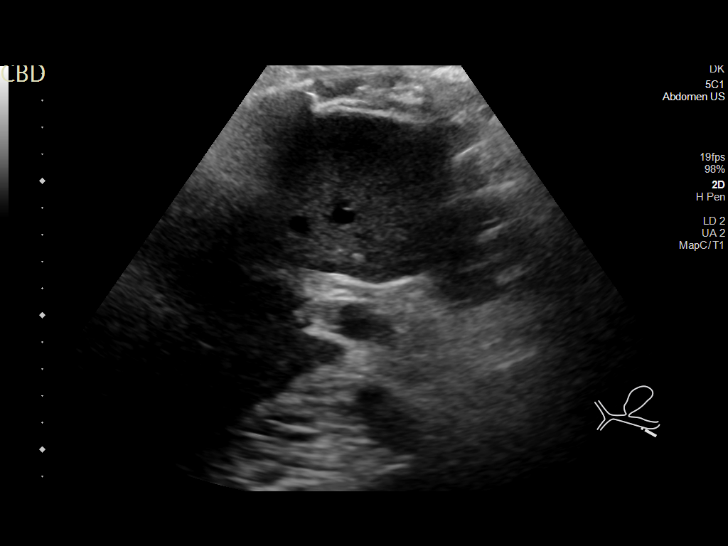
[im 5/16]
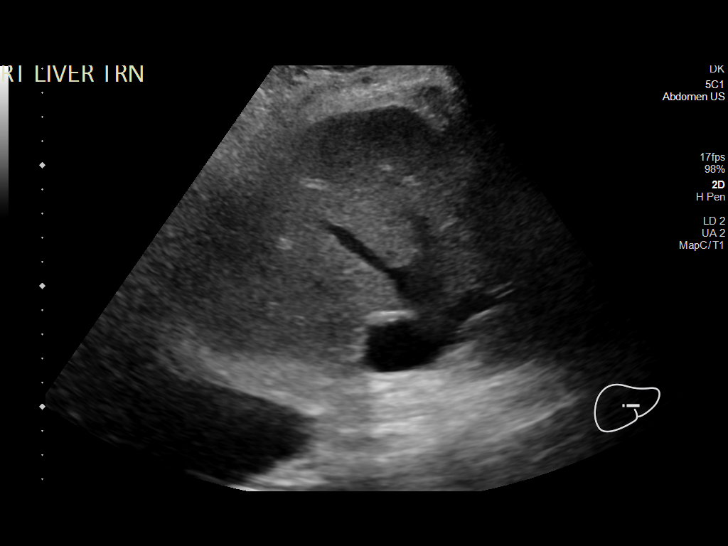
[im 6/16]
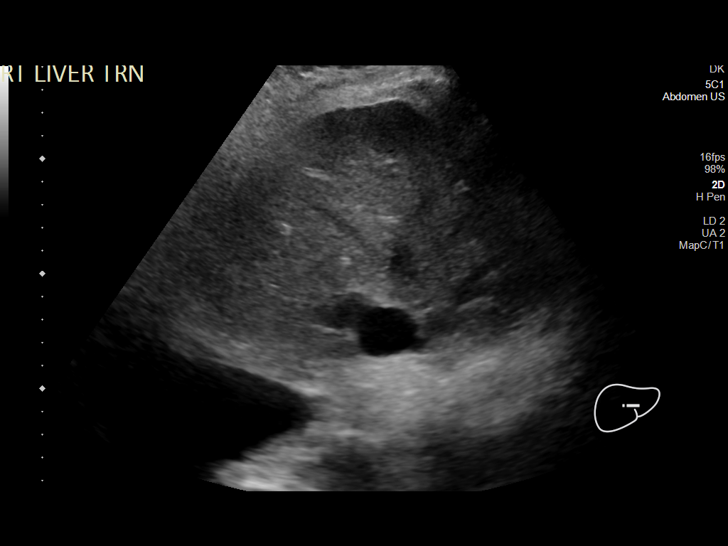
[im 7/16]
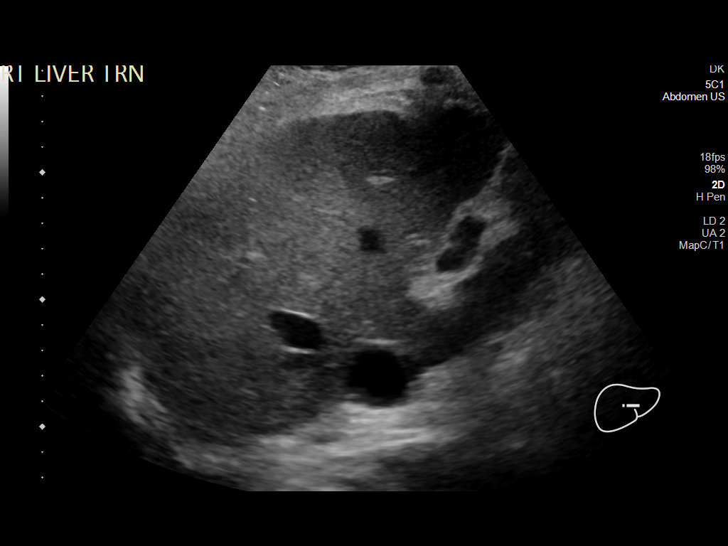
[im 8/16]
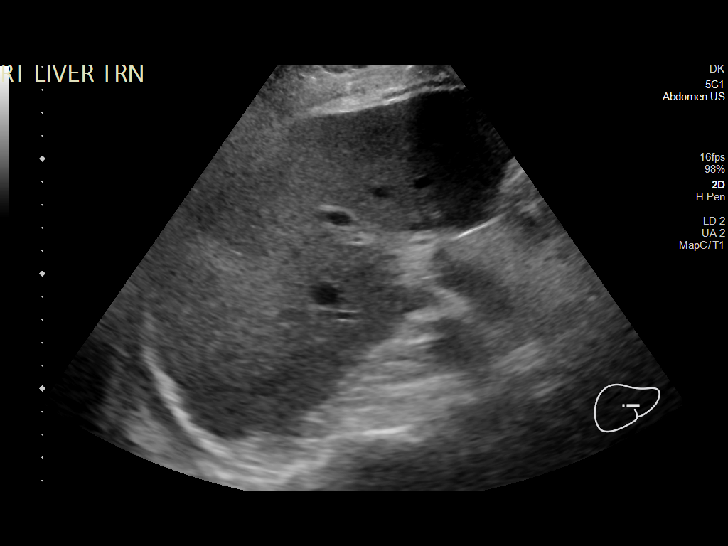
[im 9/16]
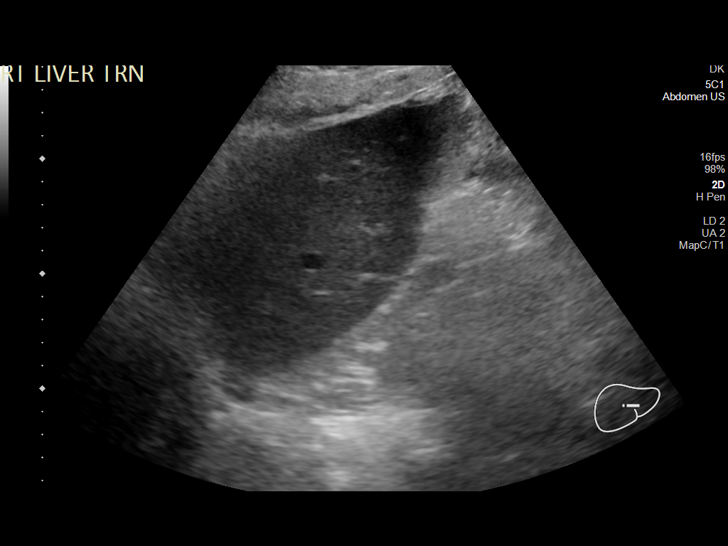
[im 10/16]
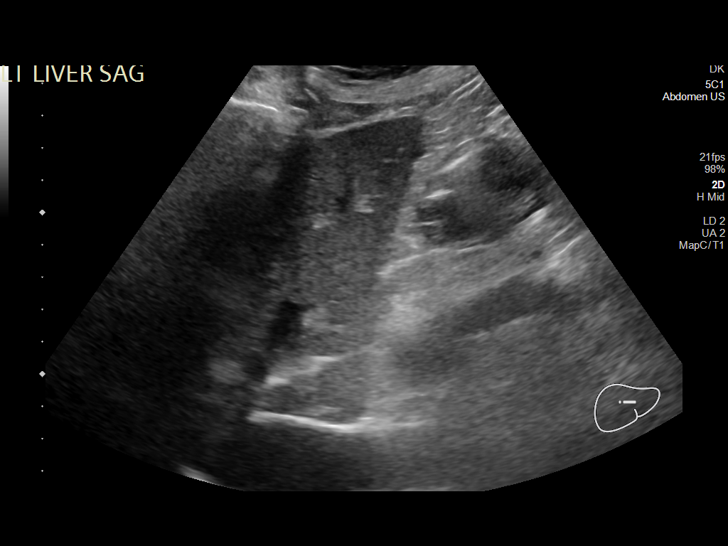
[im 11/16]
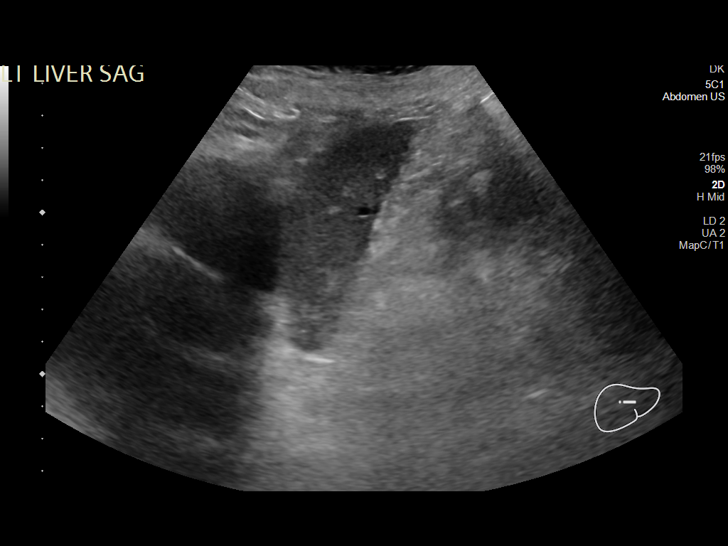
[im 13/16]
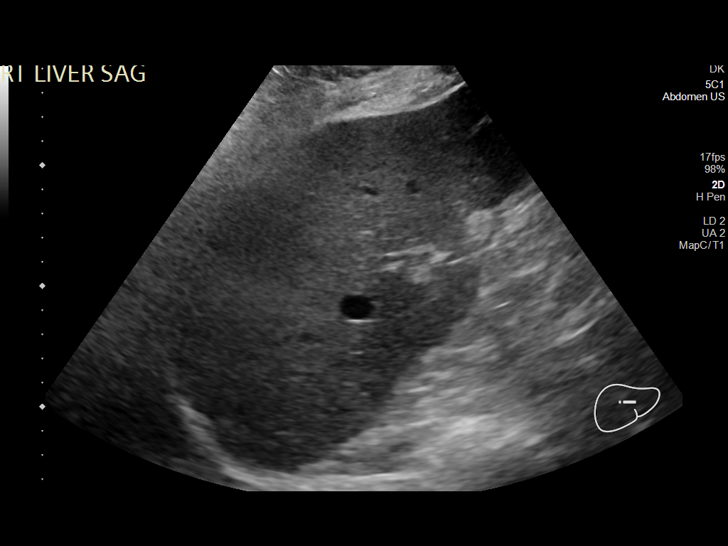
[im 14/16]
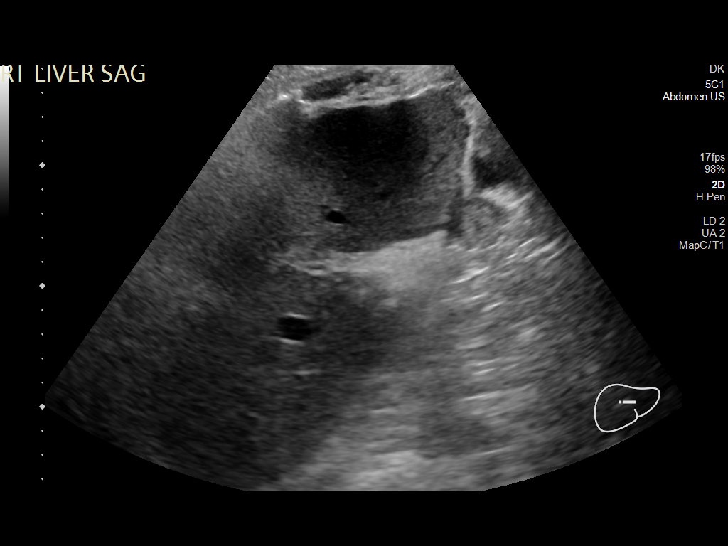
[im 15/16]
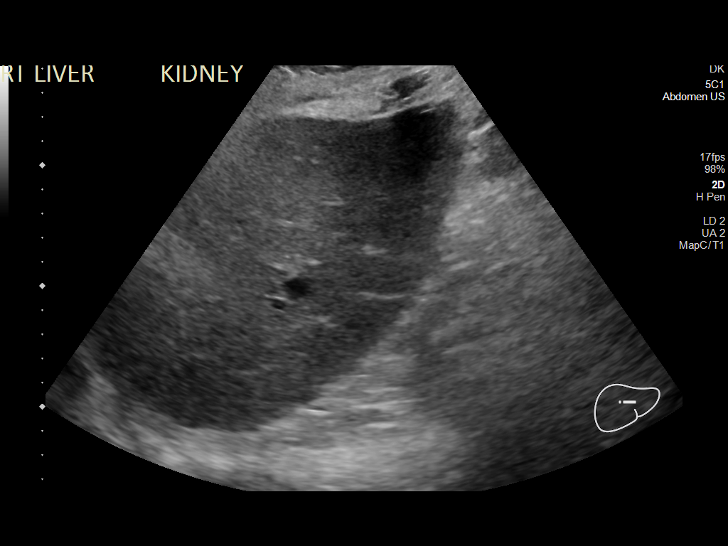
[im 16/16]
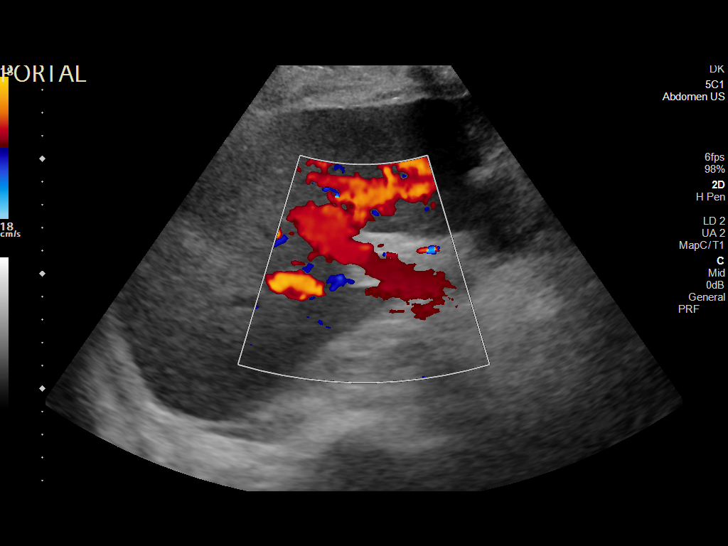

[14 of 16 positions shown; findings below may reference images not displayed]

FINDINGS: Gallbladder:

Surgically removed

Common bile duct:

Diameter: 4.5 mm

Liver:

Diffusely increased in echogenicity consistent with fatty
infiltration. No mass is seen. Note is made of a small right pleural
effusion.

Other: None.
IMPRESSION: Status post cholecystectomy.

Fatty infiltration of the liver.

No acute abnormality in the upper abdomen is seen.

Incidental note of right pleural effusion.

## 2020-08-23 IMAGING — DX DG CHEST 1V PORT
1 series · 1 of 1 positions shown · non-contrast
Comparison: Film from earlier in the same day.

CLINICAL DATA: Status post central line placement

EXAM:
PORTABLE CHEST 1 VIEW

[chest]
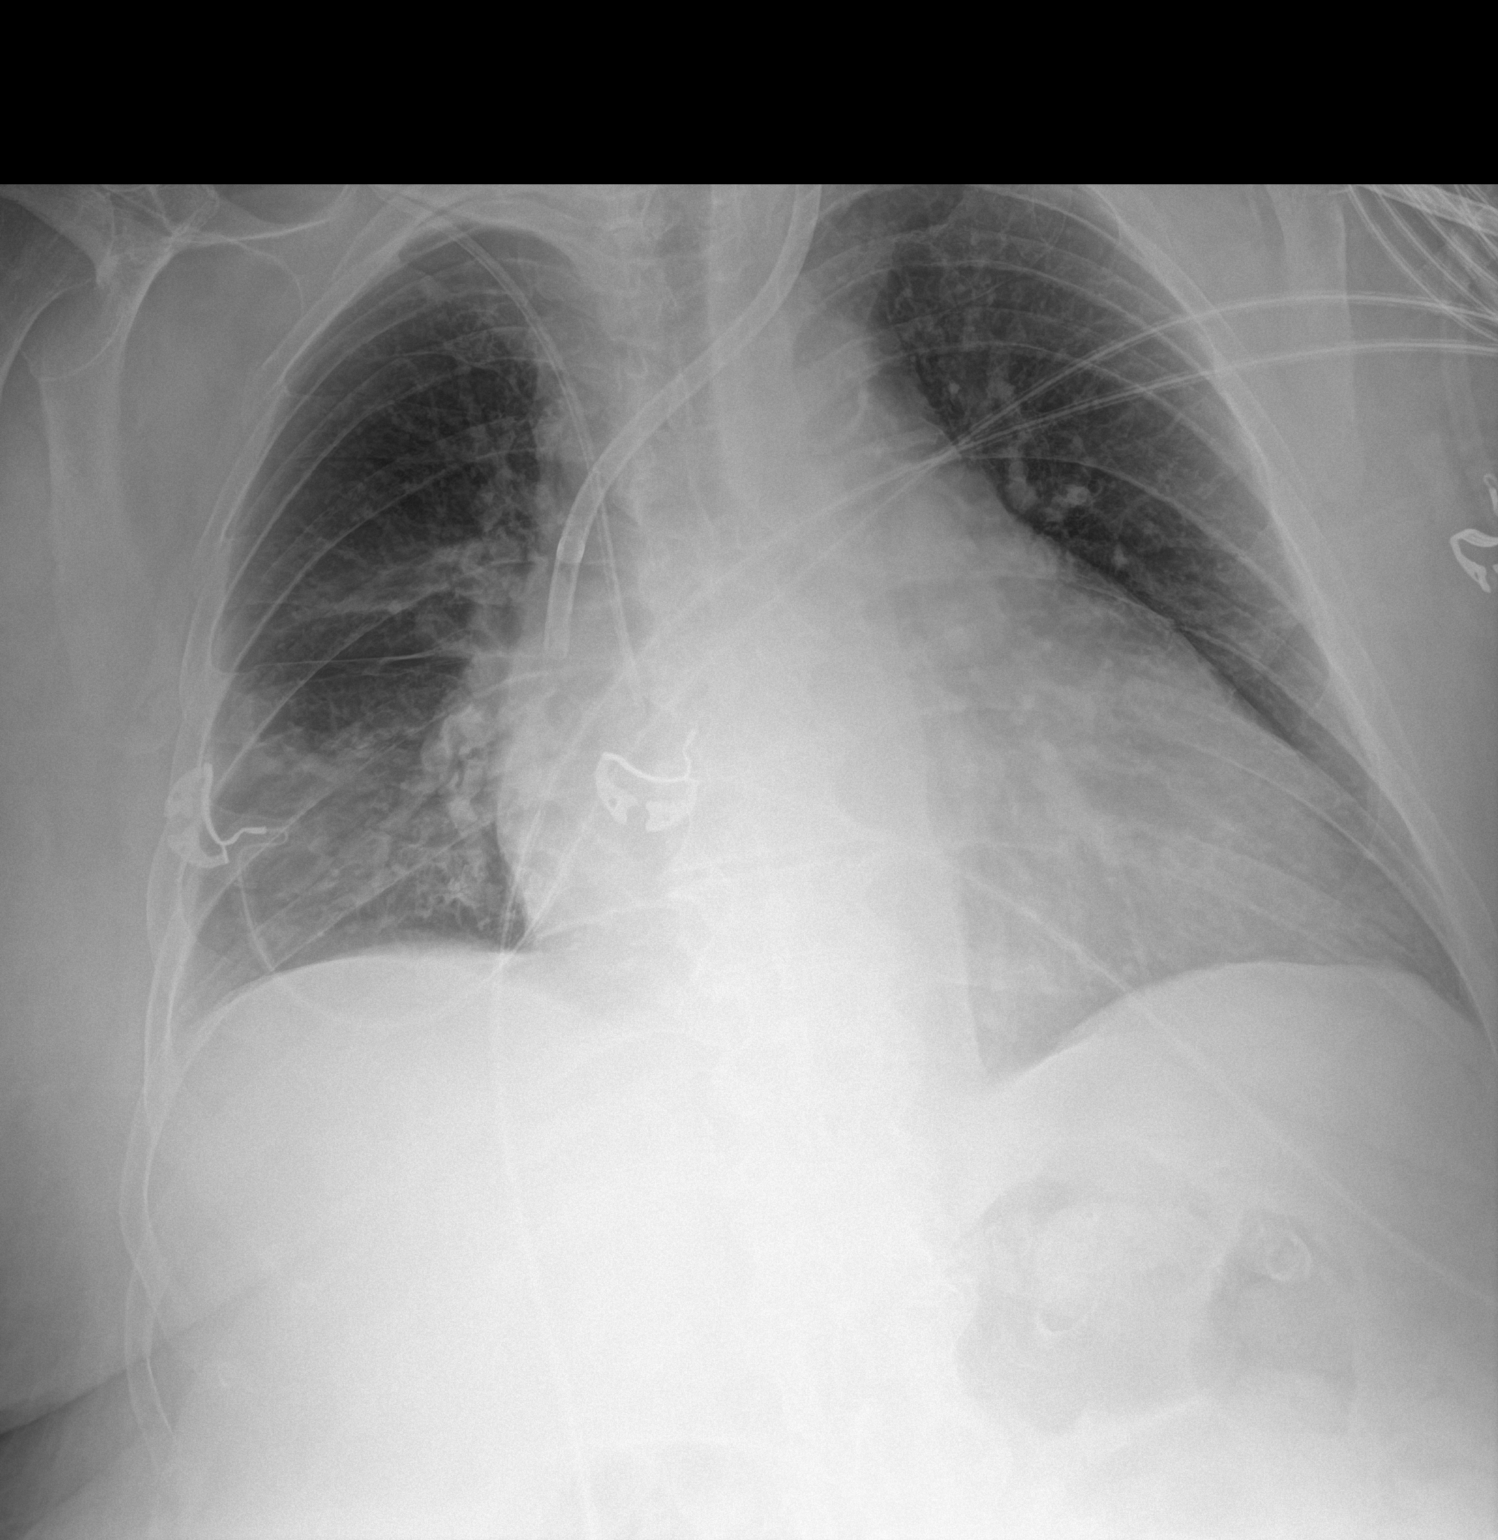

[1 of 1 positions shown; findings below may reference images not displayed]

FINDINGS: Cardiac shadow is enlarged but stable. Dialysis catheter on the left
is again seen. Right-sided central venous line is noted with
catheter tip at the cavoatrial junction. No pneumothorax is seen.
Persistent airspace opacity in the right base is noted. No other
focal abnormality is noted.
IMPRESSION: No pneumothorax following central line placement.

## 2020-08-24 IMAGING — CT CT HEAD CODE STROKE
3 series · 14 of 47 positions shown, 16 images · non-contrast
Comparison: Head CT 03/07/2020.  Brain MRI 09/27/2018.

CLINICAL DATA: Code stroke.  69-year-old female code stroke.

EXAM:
CT HEAD WITHOUT CONTRAST
TECHNIQUE: Contiguous axial images were obtained from the base of the skull
through the vertex without intravenous contrast.

[Series 3: head 5.0 h30s · axial · 0.39mm/px · z∈[+1055,+1200]mm · 8 of 35 slices shown, 10 images]
[im 3/35  brain]
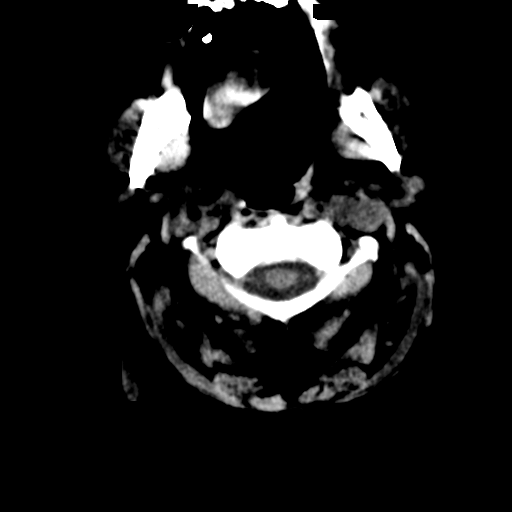
[im 3/35  bone]
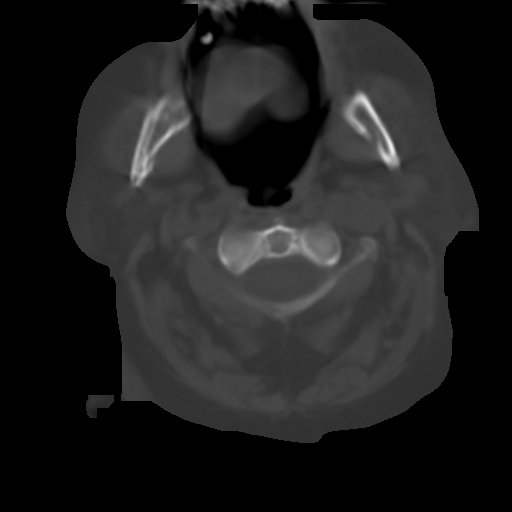
[im 8/35  brain]
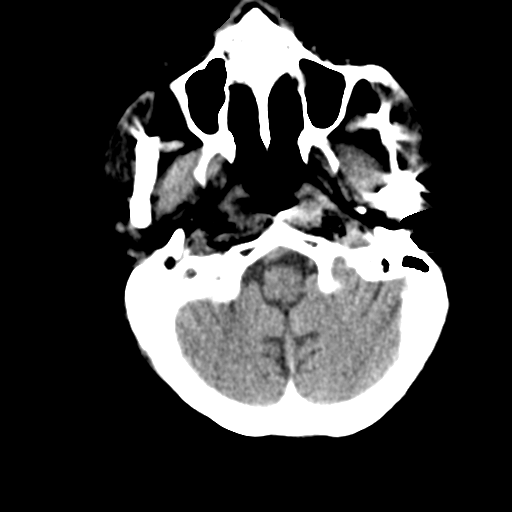
[im 11/35  brain]
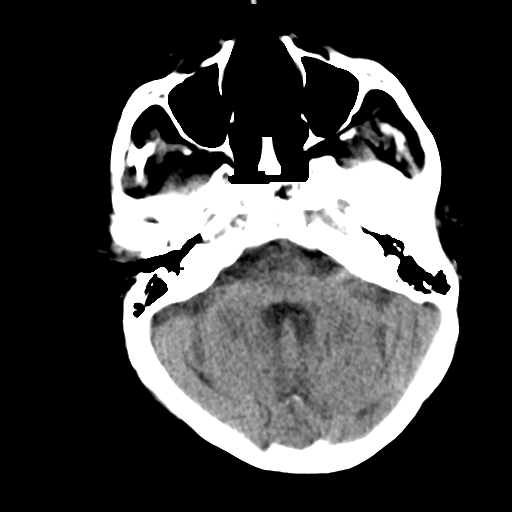
[im 16/35  brain]
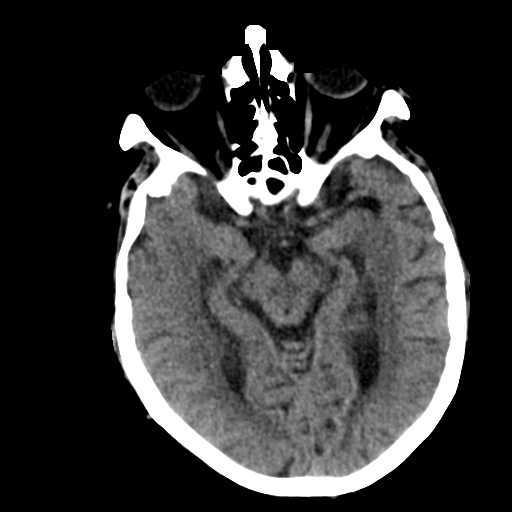
[im 19/35  brain]
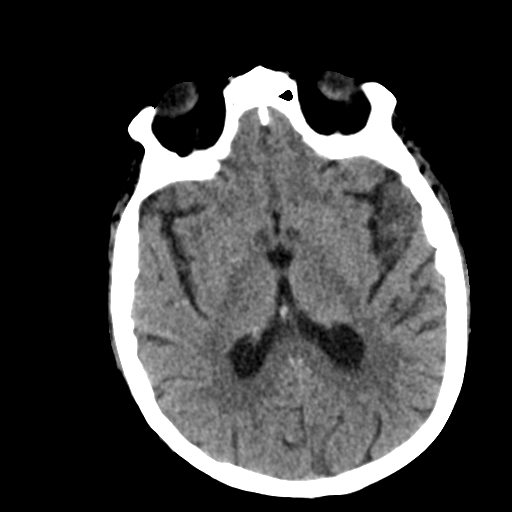
[im 19/35  bone]
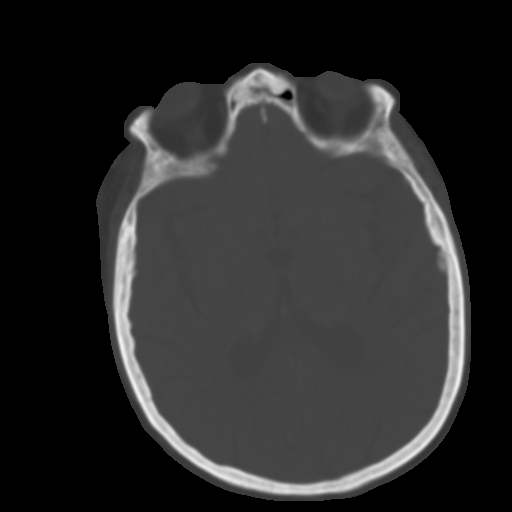
[im 24/35  brain]
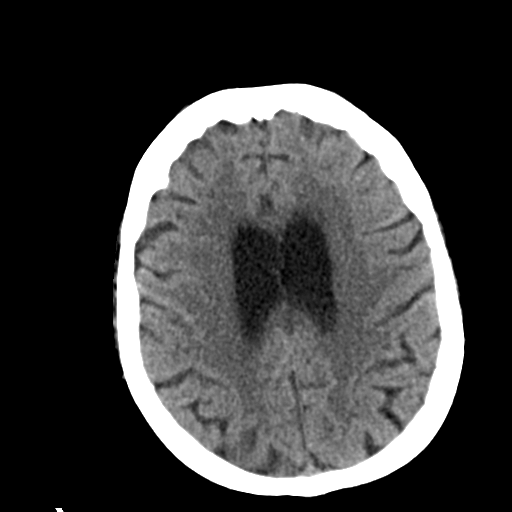
[im 27/35  brain]
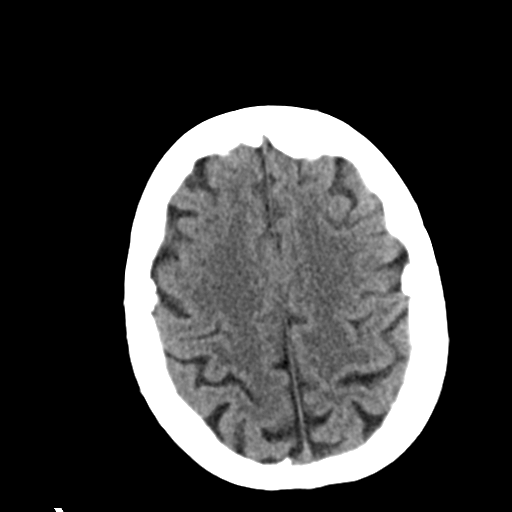
[im 32/35  brain]
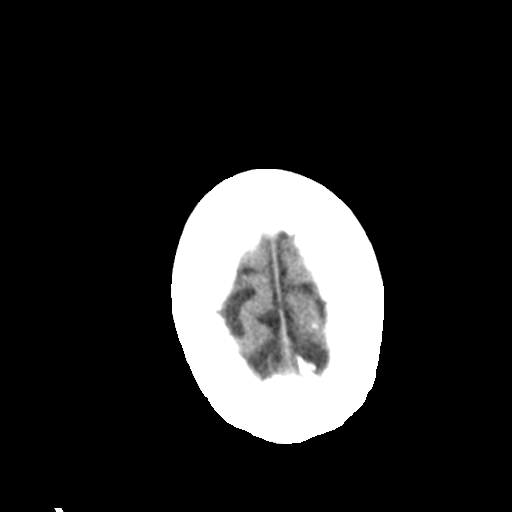

[Series 5: head 3.0 mpr cor · coronal · 0.34mm/px · 3 of 64 slices shown]
[im 22/64  brain]
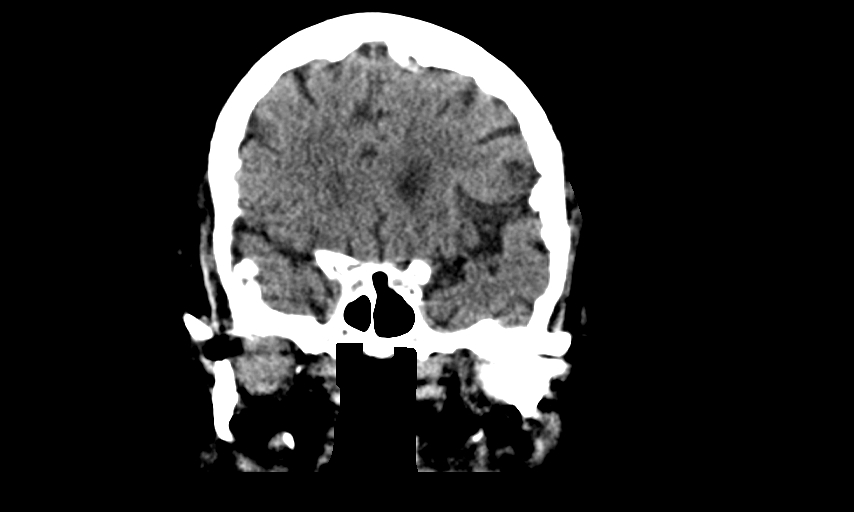
[im 29/64  brain]
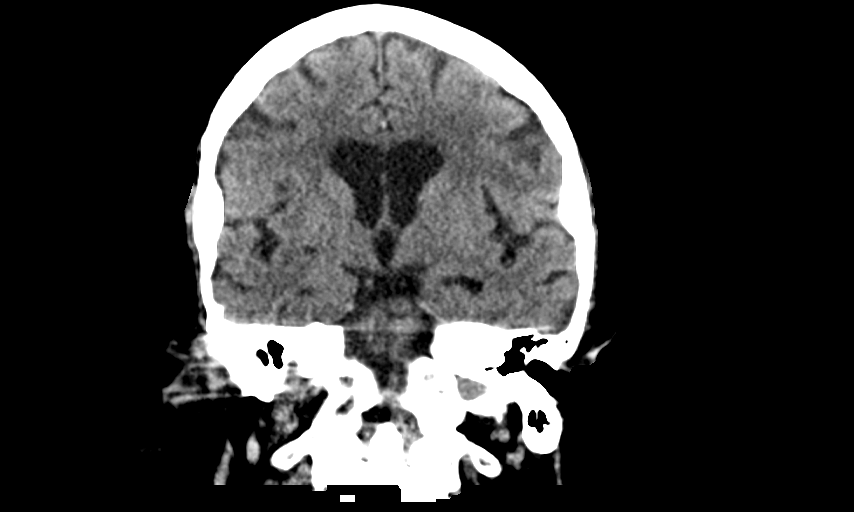
[im 36/64  brain]
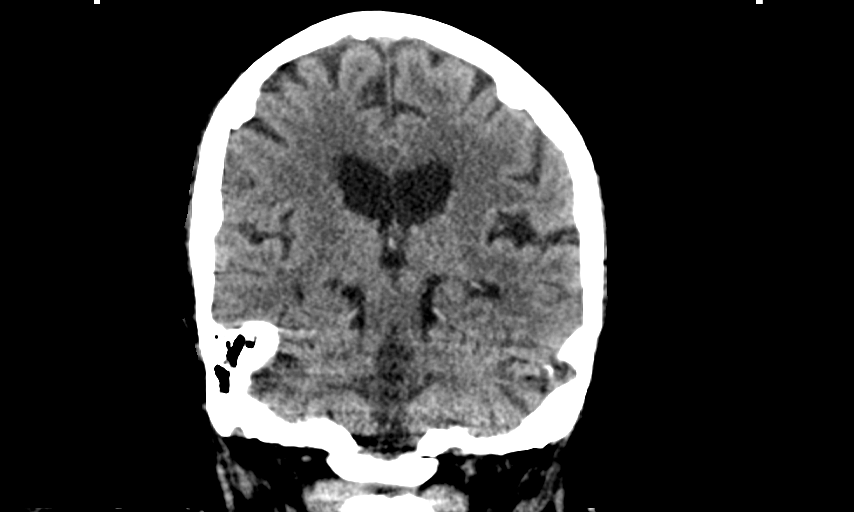

[Series 6: head 3.0 mpr sag · sagittal · 0.34mm/px · 3 of 61 slices shown]
[im 21/61  brain]
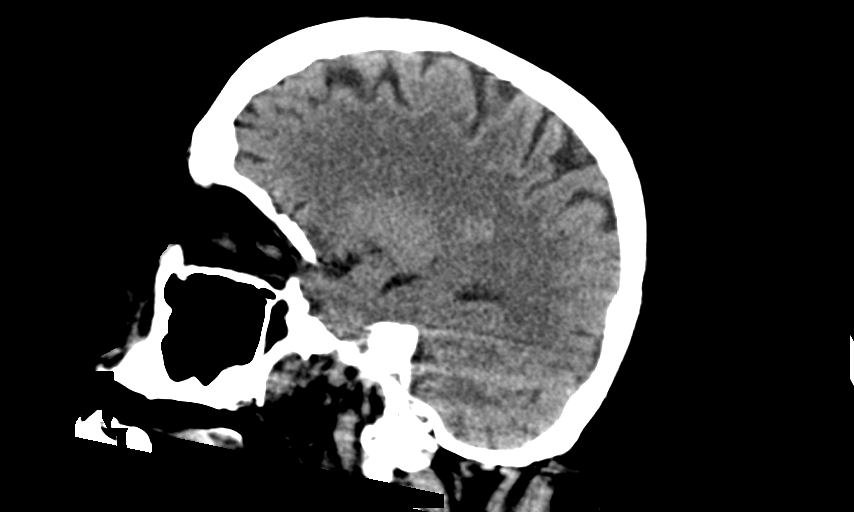
[im 31/61  brain]
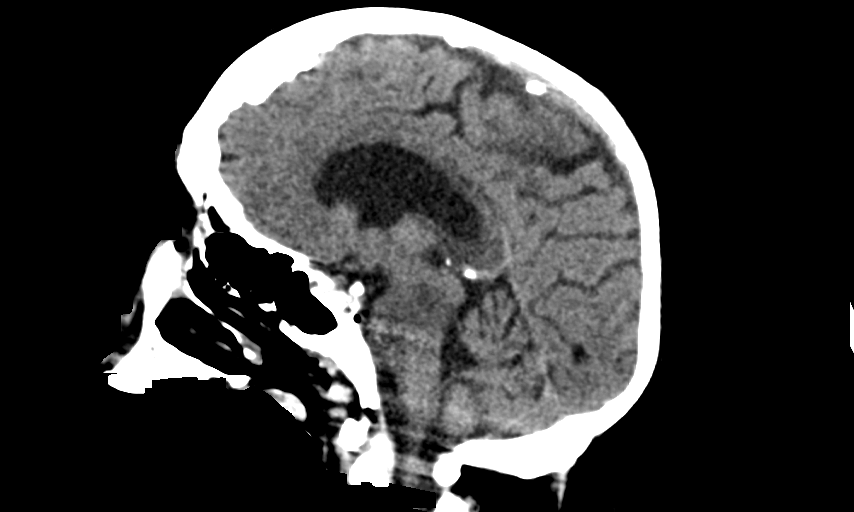
[im 41/61  brain]
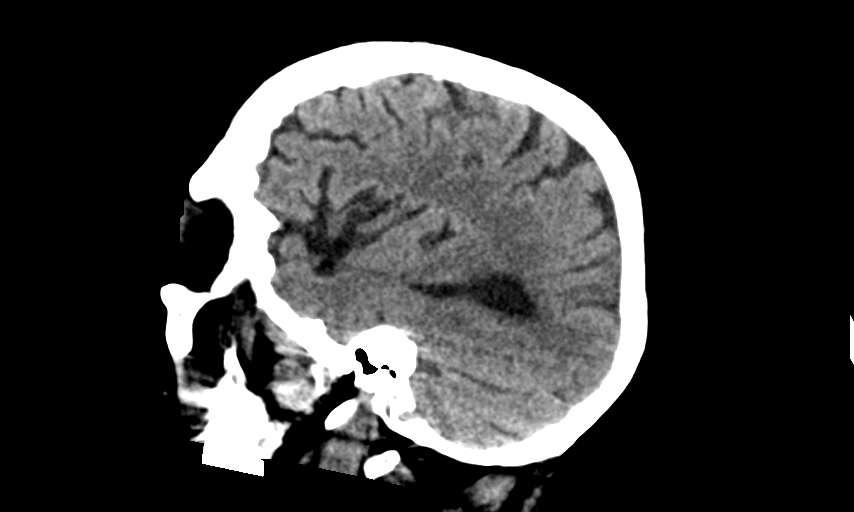

[14 of 47 positions shown; findings below may reference images not displayed]

FINDINGS: Brain: Gray-white matter differentiation is stable and normal for
age throughout the brain.

No midline shift, ventriculomegaly, mass effect, evidence of mass
lesion, intracranial hemorrhage or evidence of cortically based
acute infarction.

Vascular: Calcified atherosclerosis at the skull base. No suspicious
intracranial vascular hyperdensity.

Skull: No acute osseous abnormality identified.

Sinuses/Orbits: Visualized paranasal sinuses and mastoids are stable
and well pneumatized.

Other: No acute orbit or scalp soft tissue finding.

ASPECTS (Alberta Stroke Program Early CT Score)

Total score (0-10 with 10 being normal): 10
IMPRESSION: 1. Stable and normal for age non contrast CT appearance of the
brain. ASPECTS 10.

2. These results were communicated to Dr. Marroquin at [DATE] on
03/11/2020 by text page via the [employer] messaging system.

## 2020-08-25 IMAGING — DX DG CHEST 1V PORT
1 series · 1 of 1 positions shown · non-contrast
Comparison: Radiograph 03/10/2020.  Most recent CT 01/23/2020

CLINICAL DATA: Pleural effusion.

EXAM:
PORTABLE CHEST 1 VIEW

[chest]
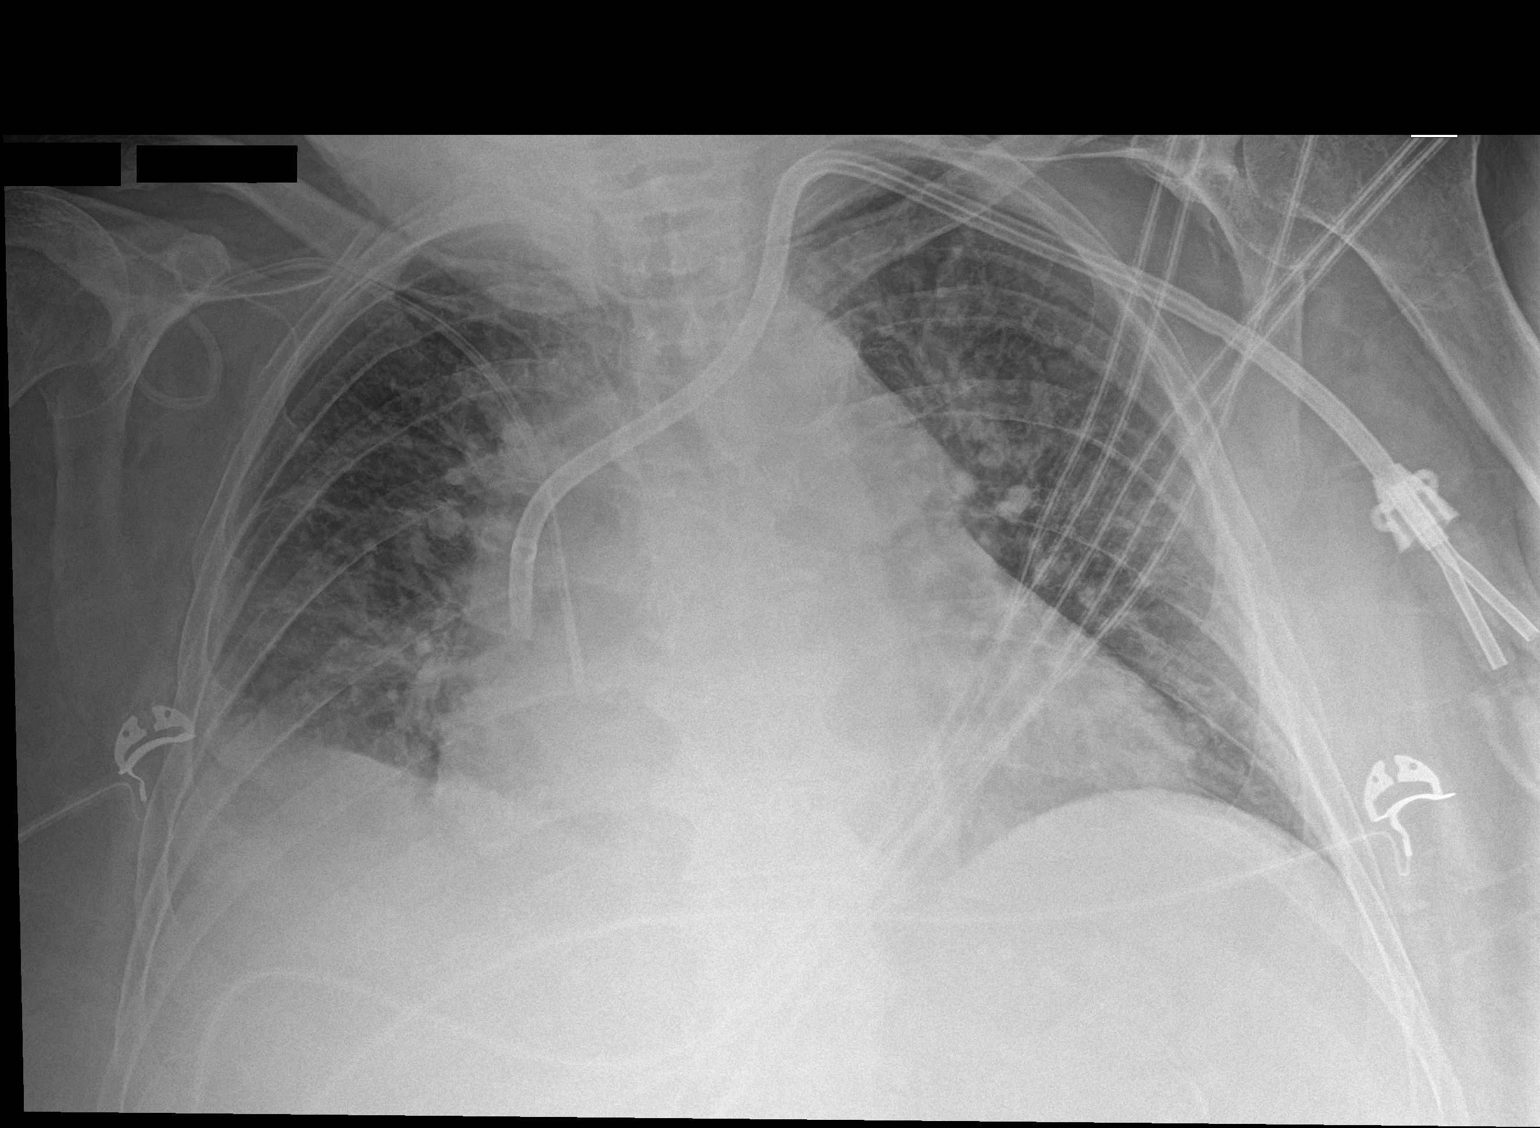

[1 of 1 positions shown; findings below may reference images not displayed]

FINDINGS: Right subclavian central catheter tip in the lower SVC. Lateral
portion of catheter is looped. Left-sided dialysis catheter tip in
the lower SVC. Hazy opacity at the right lung base likely represents
pleural effusion. There is no visualized pneumothorax. Stable
cardiomegaly. Vascular congestion. Interstitial opacities may
represent pulmonary edema.
IMPRESSION: 1. Right subclavian central catheter tip in the lower SVC. Lateral
portion of catheter is looped, this portion may be external to the
patient.
2. Hazy opacity at the right lung base likely represents pleural
effusion.
3. Stable cardiomegaly, vascular congestion and interstitial
opacities, suggesting pulmonary edema.

## 2020-08-25 IMAGING — MR MR HEAD W/O CM
6 of 11 series · 25 of 48 positions shown · non-contrast
Comparison: Head CT yesterday.  Brain MRI 09/27/2018.

CLINICAL DATA: 69-year-old female with altered mental status. On
anticoagulation for atrial fibrillation.

EXAM:
MRI HEAD WITHOUT CONTRAST
TECHNIQUE: Multiplanar, multiecho pulse sequences of the brain and surrounding
structures were obtained without intravenous contrast.

[Series 2: DWI · axial · 3.0mm · 0.94mm/px · z∈[-84,+58]mm · 8 of 100 slices shown (1 of 2)]
[im 1/100]
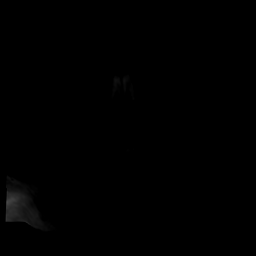
[im 12/100]
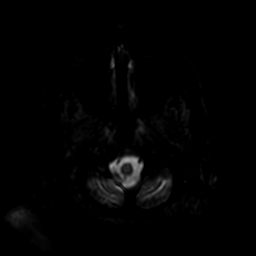
[im 34/100]
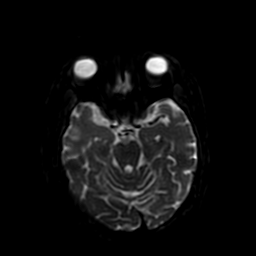
[im 45/100]
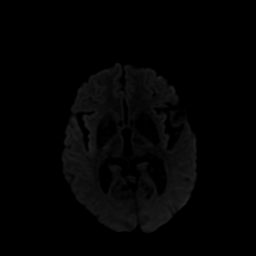
[im 56/100]
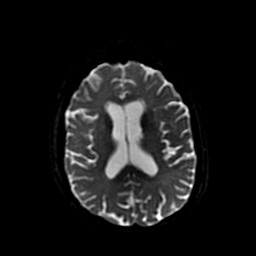
[im 67/100]
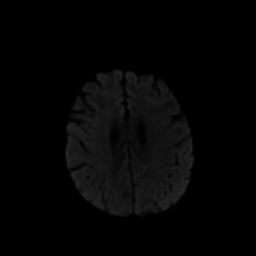
[im 89/100]
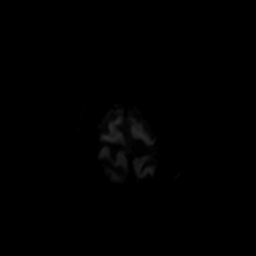
[im 100/100]
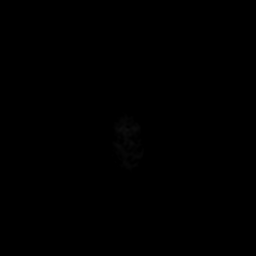

[Series 3: DWI · coronal · 4.0mm · 0.94mm/px · 6 of 74 slices shown (2 of 2)]
[im 1/74]
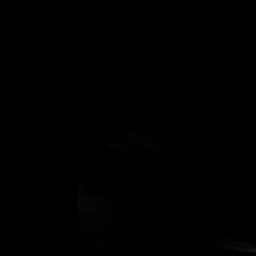
[im 15/74]
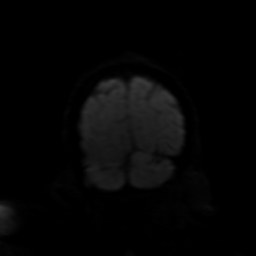
[im 30/74]
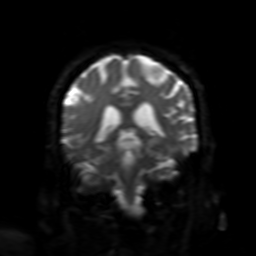
[im 44/74]
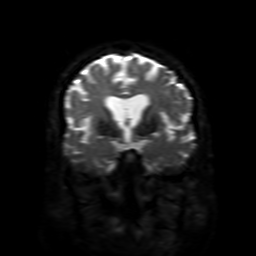
[im 59/74]
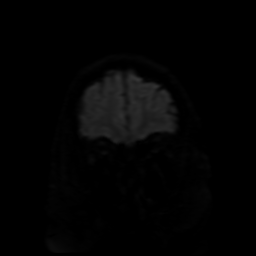
[im 74/74]
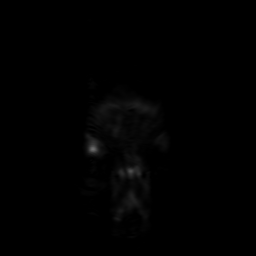

[Series 4: FLAIR · sagittal · 5.0mm · 0.23mm/px · 2 of 25 slices shown (1 of 2)]
[im 1/25]
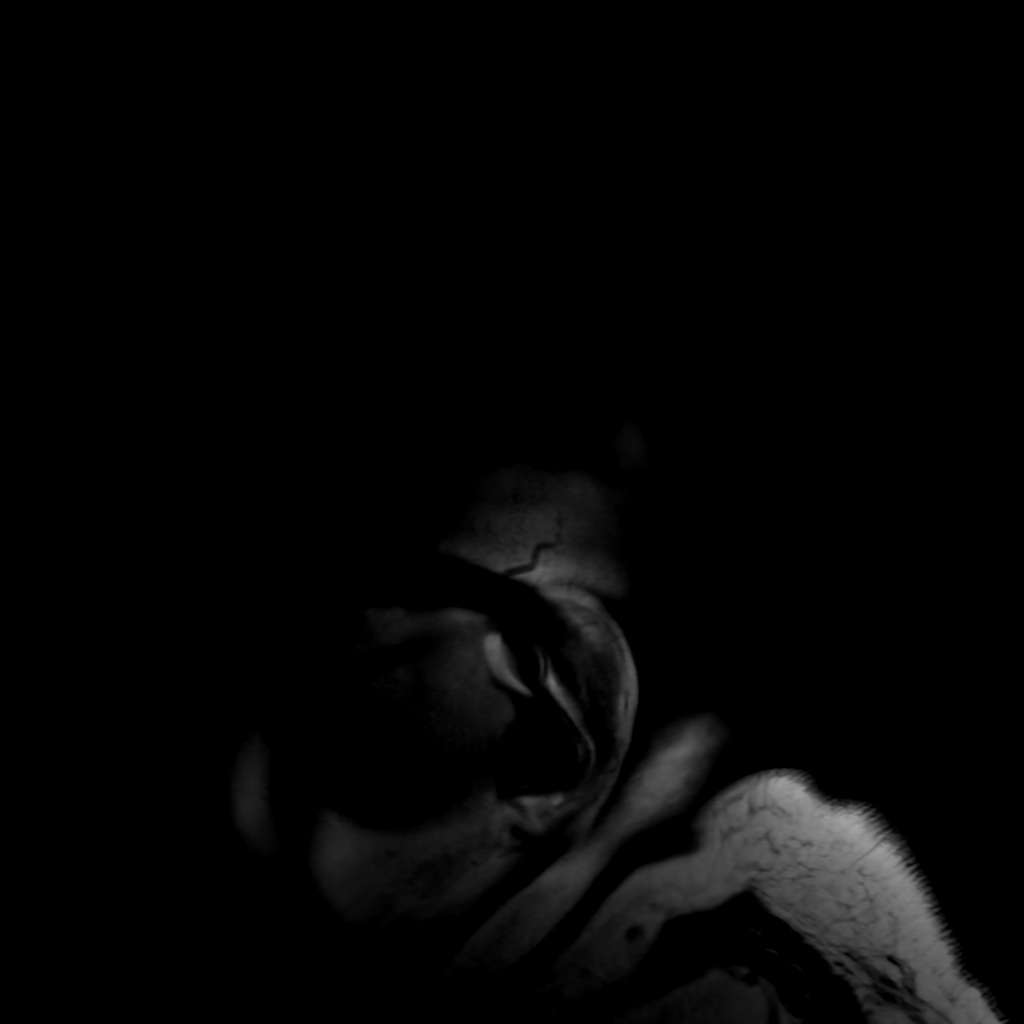
[im 25/25]
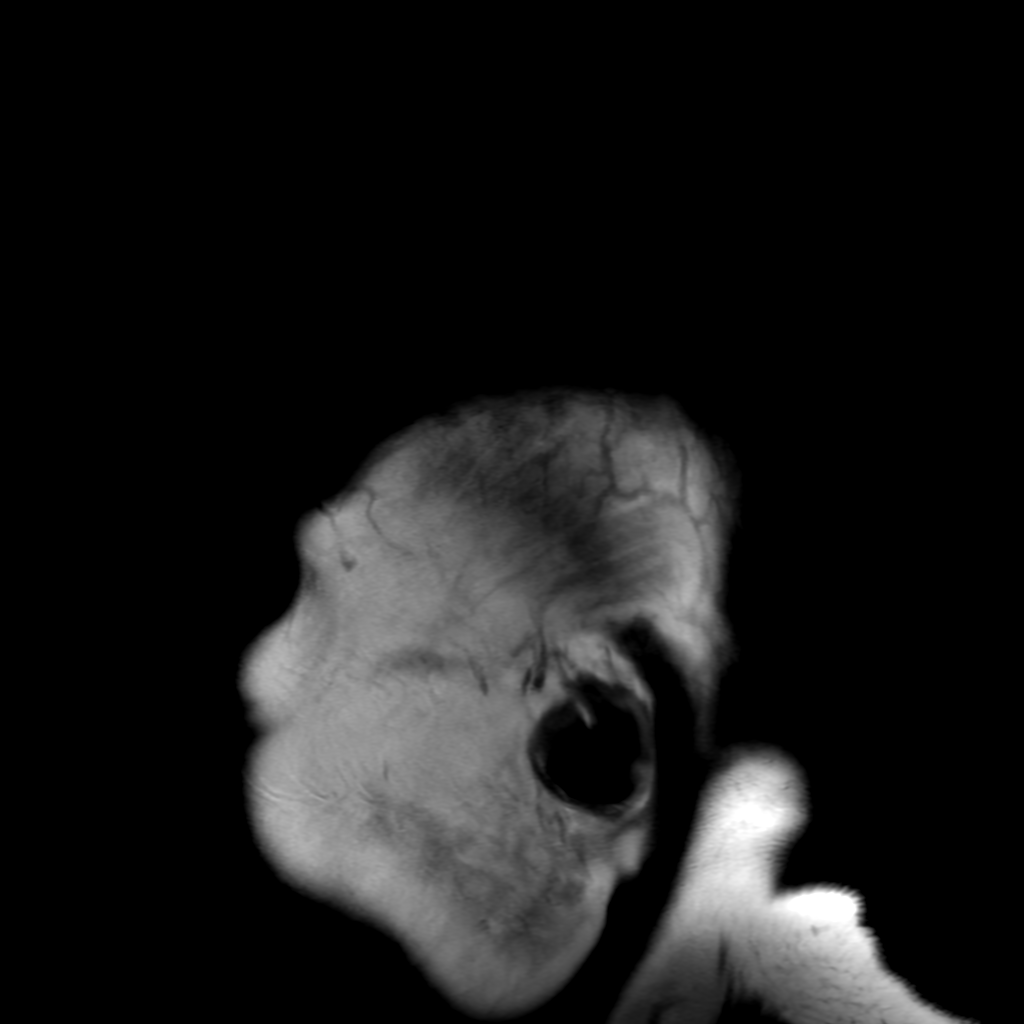

[Series 6: FLAIR · axial · 5.0mm · 0.47mm/px · z∈[-86,+60]mm · 2 of 26 slices shown (2 of 2)]
[im 1/26]
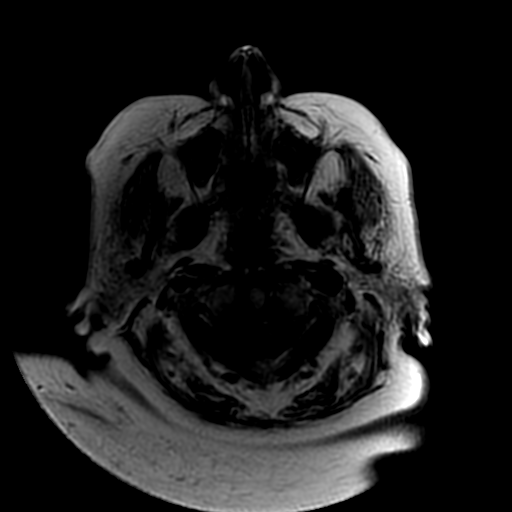
[im 26/26]
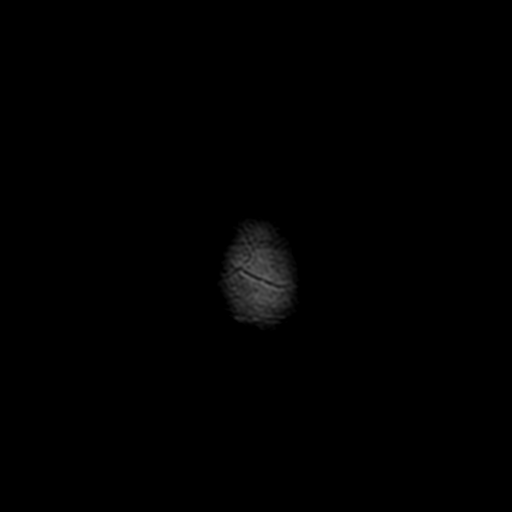

[Series 250: ADC · axial · 3.0mm · 0.94mm/px · z∈[-84,+58]mm · 4 of 49 slices shown (1 of 2)]
[im 1/49]
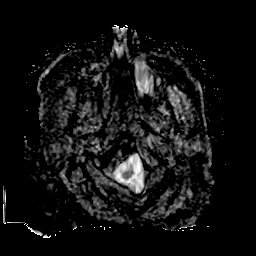
[im 17/49]
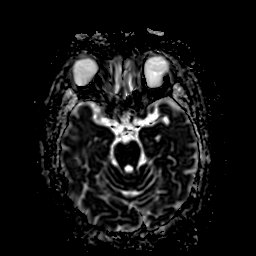
[im 33/49]
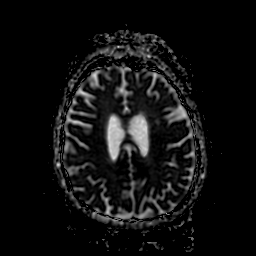
[im 49/49]
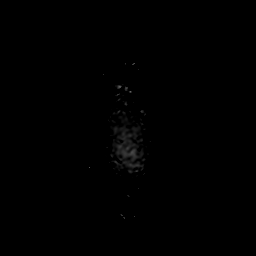

[Series 350: ADC · coronal · 4.0mm · 0.94mm/px · 3 of 37 slices shown (2 of 2)]
[im 1/37]
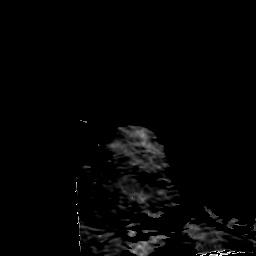
[im 19/37]
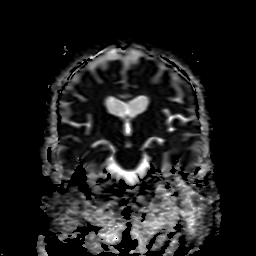
[im 37/37]
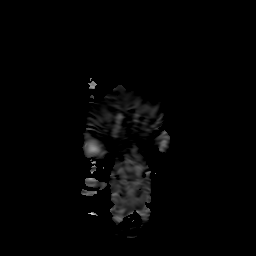

[25 of 48 positions shown; findings below may reference images not displayed]

FINDINGS: Brain: Stable cerebral volume. No restricted diffusion to suggest
acute infarction. No midline shift, mass effect, evidence of mass
lesion, ventriculomegaly, extra-axial collection or acute
intracranial hemorrhage. Cervicomedullary junction and pituitary are
within normal limits.

Stable minimal nonspecific cerebral white matter T2 and FLAIR
hyperintensity since 2828. Suggestion of a small chronic focus of
encephalomalacia along the medial cerebellum near the vermis (series
6, image 7). No other cortical encephalomalacia. No chronic cerebral
blood products are evident. Deep gray nuclei and brainstem remain
normal.

Vascular: Major intracranial vascular flow voids are stable since
last year.

Skull and upper cervical spine: Negative.

Sinuses/Orbits: Stable and negative orbits. Small maxillary sinus
mucous retention cysts are stable.

Other: Trace mastoid fluid is stable since last year. Grossly normal
visible internal auditory structures. Scalp and face soft tissues
appear negative.
IMPRESSION: 1.  No acute intracranial abnormality.
2. Stable noncontrast MRI appearance of the brain since 2828, normal
for age aside from evidence of a small chronic infarct in the right
cerebellum.

## 2020-08-26 IMAGING — DX DG CHEST 1V PORT
1 series · 1 of 1 positions shown · non-contrast
Comparison: March 12, 2020

CLINICAL DATA: Recent pleural effusion

EXAM:
PORTABLE CHEST 1 VIEW

[chest]
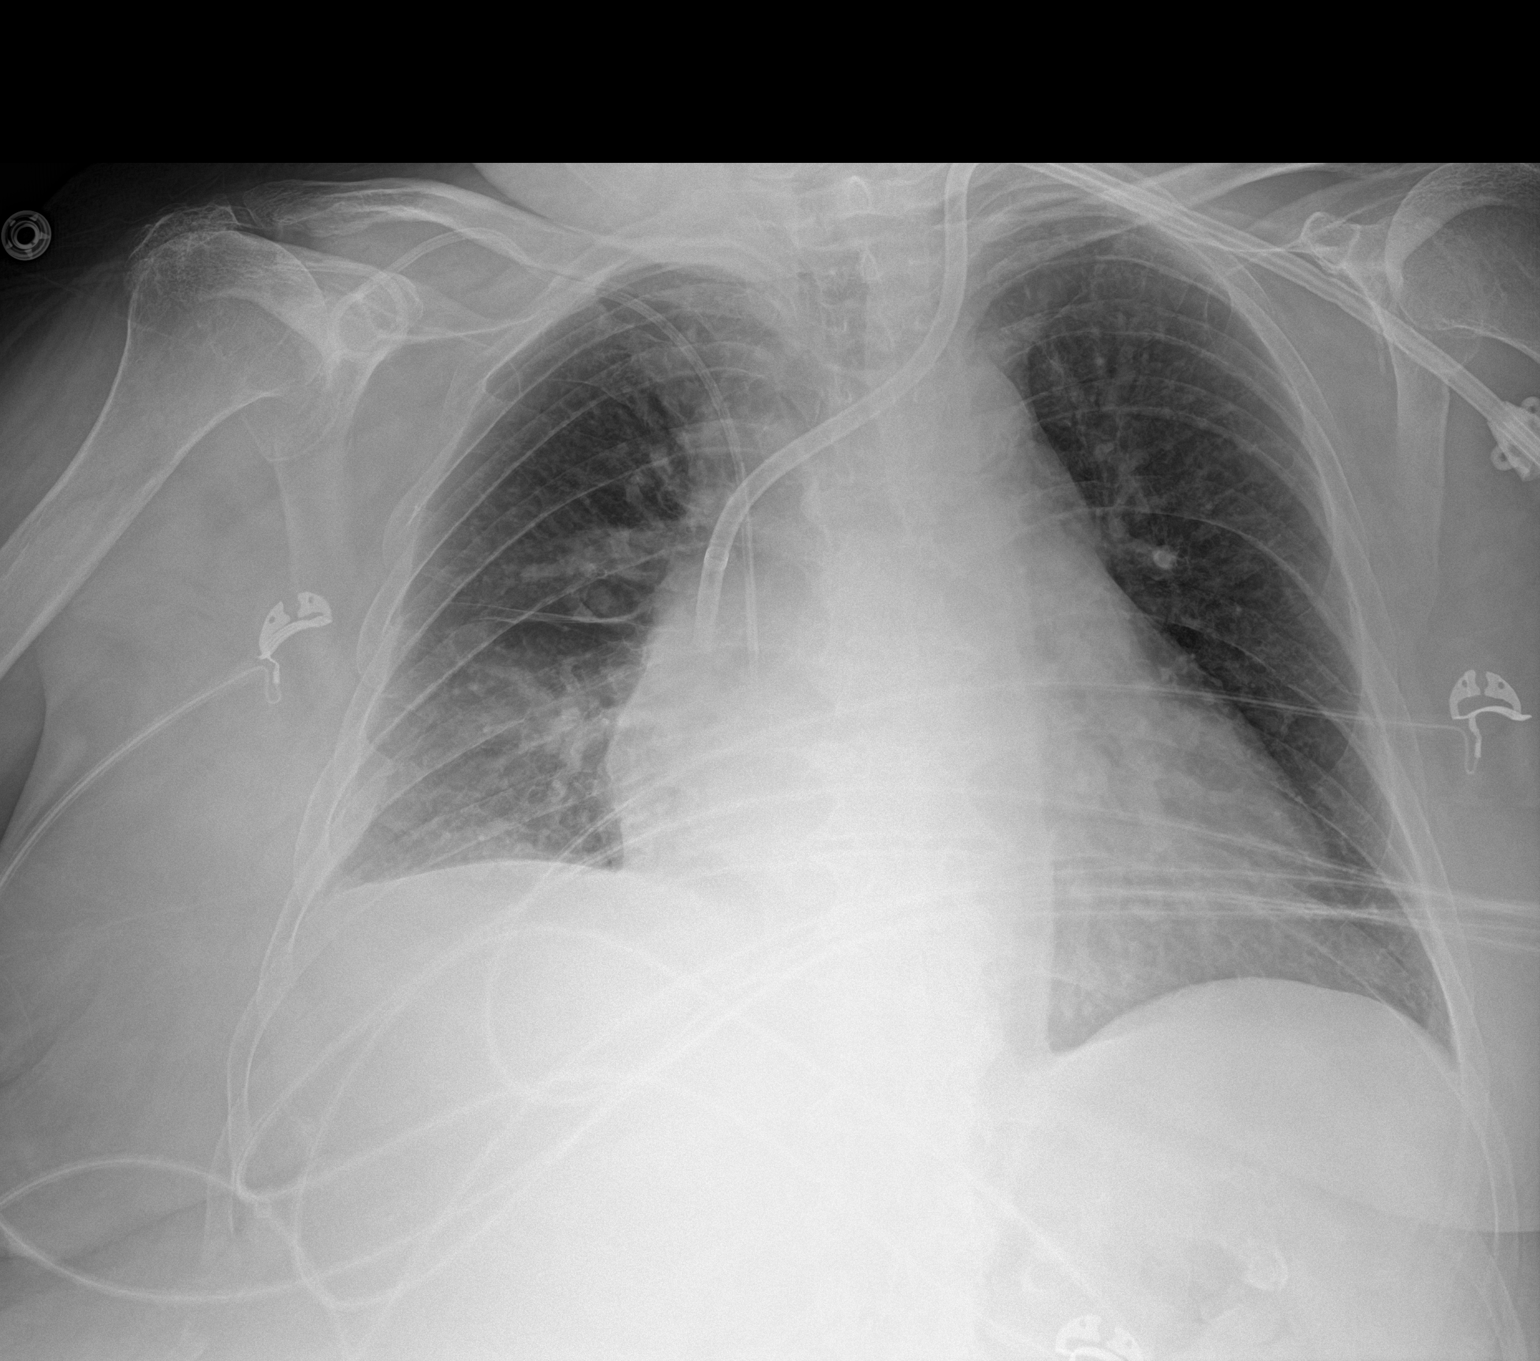

[1 of 1 positions shown; findings below may reference images not displayed]

FINDINGS: Right subclavian catheter tip is in the superior vena cava near the
cavoatrial junction. Catheter loops upon itself in the right
axillary region, unchanged. Left central catheter tip is in the
superior vena cava. No pneumothorax. There is a suspected small
right pleural effusion. Lungs elsewhere are clear. Heart is enlarged
with pulmonary vascularity normal. No adenopathy. There is
degenerative change in each shoulder.
IMPRESSION: 1. Suspect small right pleural effusion, stable. Lungs otherwise
clear.

2. Stable cardiomegaly. Pulmonary vascularity currently appears
normal.

3. The central catheter positions are unchanged. The right
subclavian catheter loops upon itself in the right axillary region,
unchanged from 1 day prior.

## 2020-08-31 IMAGING — CT CT ABD-PELV W/ CM
2 of 5 series · 16 of 46 positions shown, 18 images · IV contrast (APPLIED)
Comparison: None.

CLINICAL DATA: 69-year-old female with sepsis.

EXAM:
CT ABDOMEN AND PELVIS WITH CONTRAST
TECHNIQUE: Multidetector CT imaging of the abdomen and pelvis was performed
using the standard protocol following bolus administration of
intravenous contrast.
CONTRAST:  100mL OMNIPAQUE IOHEXOL 300 MG/ML  SOLN

[Series 3: abdomen 5.0 · axial · 0.98mm/px · z∈[+1034,+1454]mm · 13 of 97 slices shown, 15 images]
[im 7/97  soft-tissue]
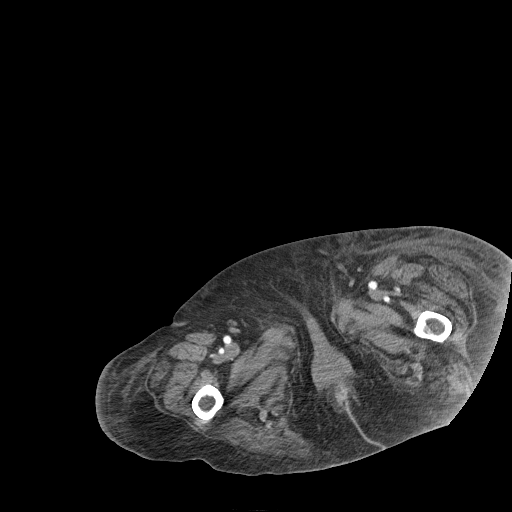
[im 7/97  bone]
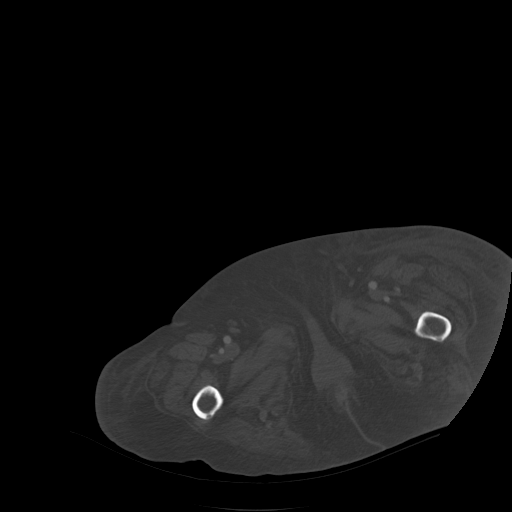
[im 13/97  soft-tissue]
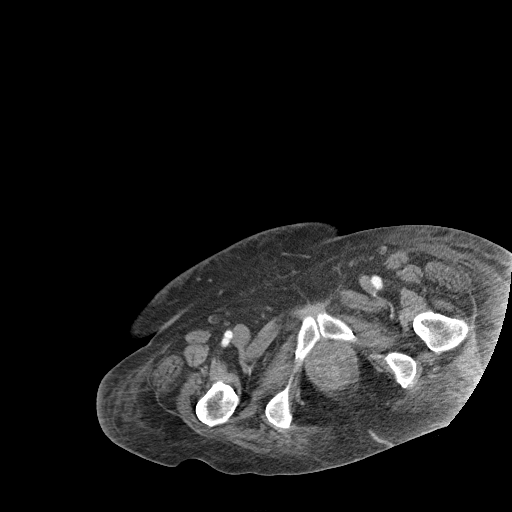
[im 19/97  soft-tissue]
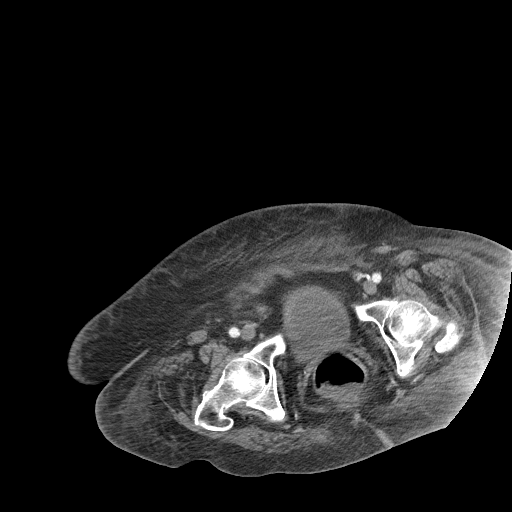
[im 31/97  soft-tissue]
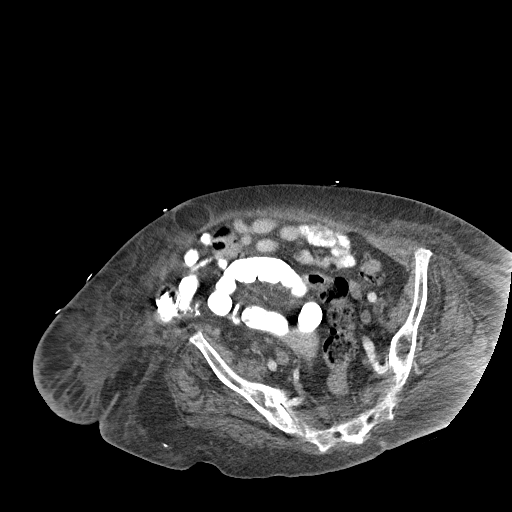
[im 37/97  soft-tissue]
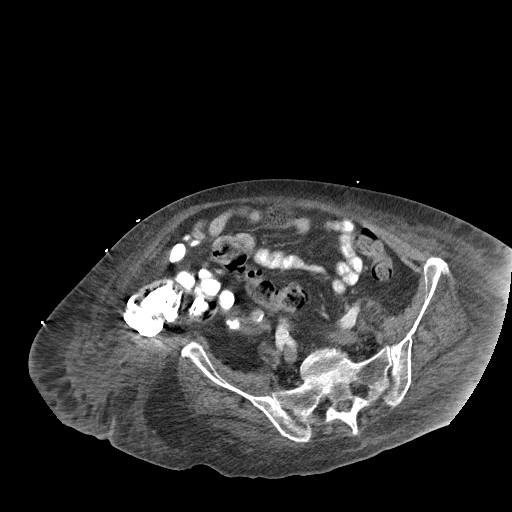
[im 43/97  soft-tissue]
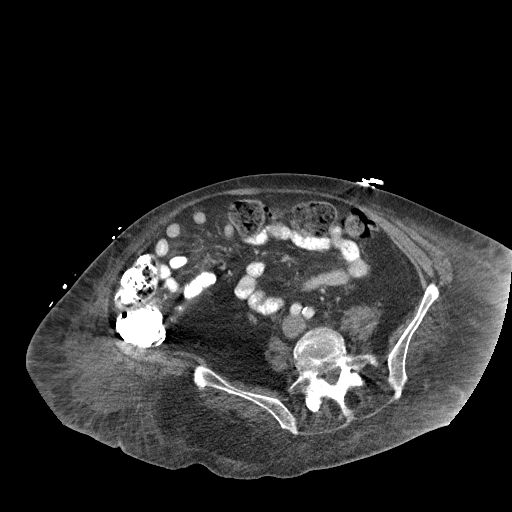
[im 49/97  soft-tissue]
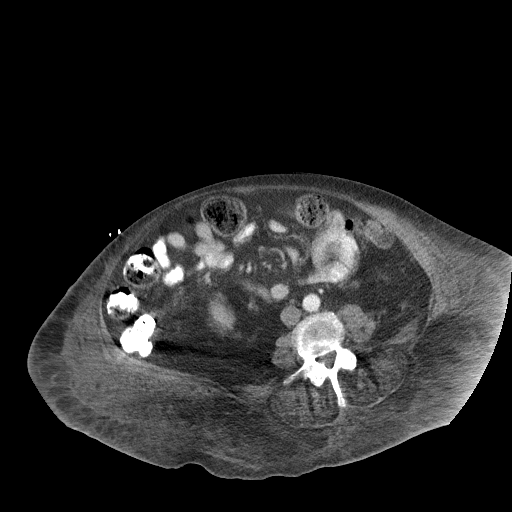
[im 55/97  soft-tissue]
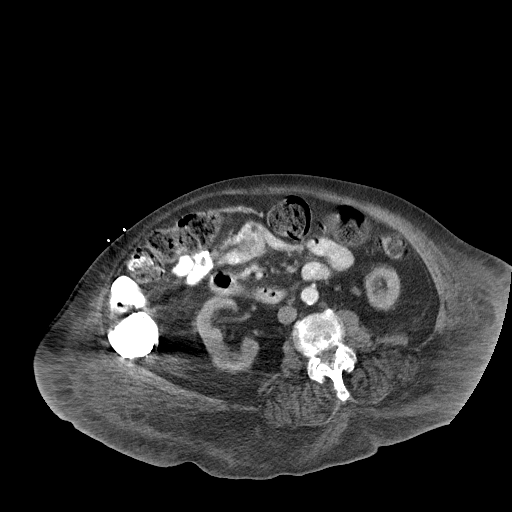
[im 61/97  soft-tissue]
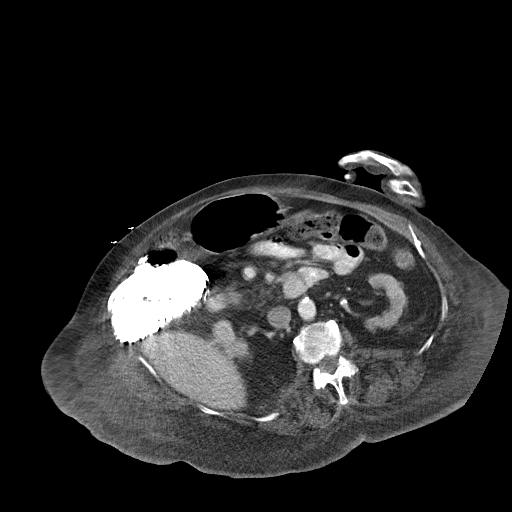
[im 61/97  bone]
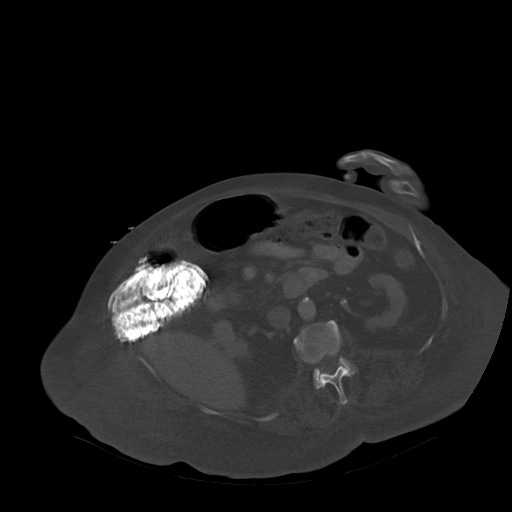
[im 67/97  soft-tissue]
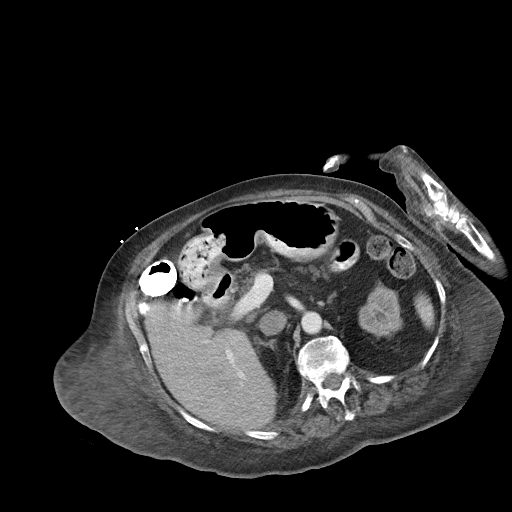
[im 79/97  soft-tissue]
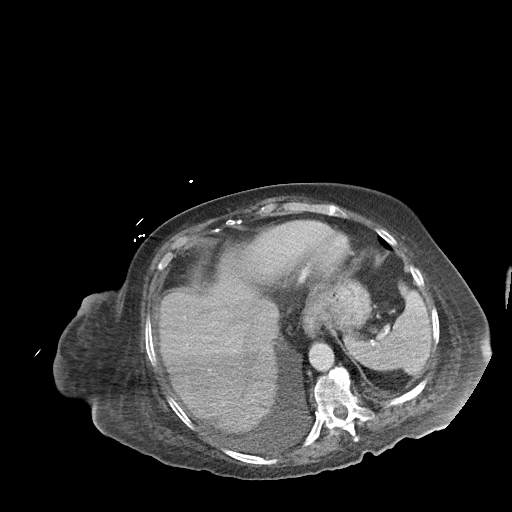
[im 85/97  soft-tissue]
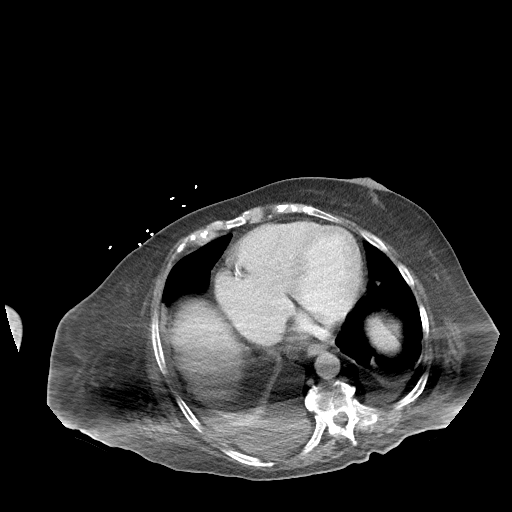
[im 91/97  soft-tissue]
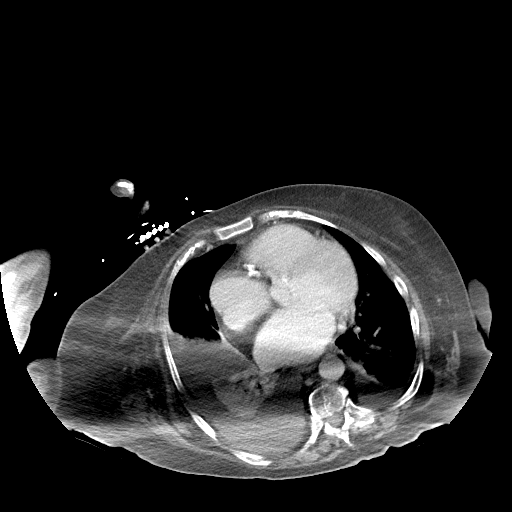

[Series 7: abdomen 3.0 mpr cor · coronal · 0.94mm/px · 3 of 117 slices shown]
[im 39/117  soft-tissue]
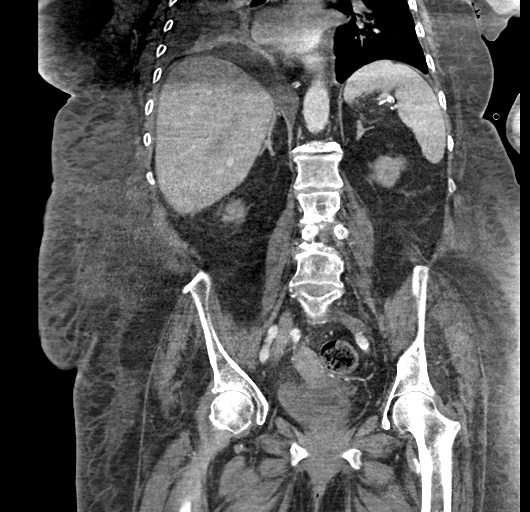
[im 52/117  soft-tissue]
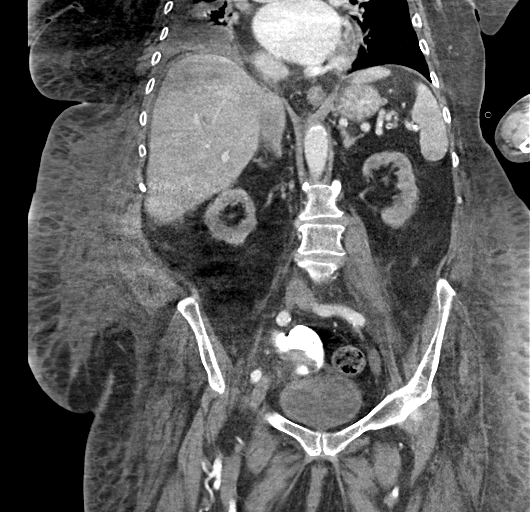
[im 65/117  soft-tissue]
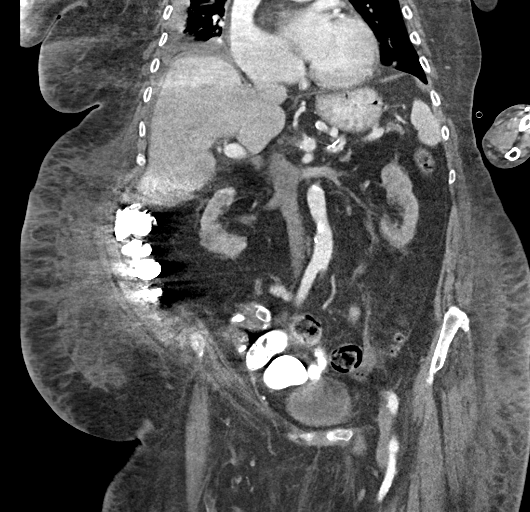

[16 of 46 positions shown; findings below may reference images not displayed]

FINDINGS: Evaluation is limited due to streak artifact caused by patient's
arms.

Lower chest: There is a moderate size right pleural effusion with
higher attenuating content suspicious for a serosanguineous or
hemorrhagic fluid. Clinical correlation is recommended. There is
consolidative changes of the visualized right lower lobe which may
represent compressive atelectasis although infiltrate is not
excluded. There is a small left pleural effusion with partial left
lung base atelectasis. There is mild cardiomegaly. Advanced coronary
vascular calcification.

No intra-abdominal free air. Small perihepatic ascites.

Hepatobiliary: The liver is unremarkable. No intrahepatic biliary
ductal dilatation. Cholecystectomy.

Pancreas: Unremarkable. No pancreatic ductal dilatation or
surrounding inflammatory changes.

Spleen: Normal in size without focal abnormality.

Adrenals/Urinary Tract: The adrenal glands unremarkable. There is
moderate bilateral renal parenchyma atrophy. There is no
hydronephrosis on either side. The visualized ureters and urinary
bladder appear unremarkable.

Stomach/Bowel: There is sigmoid diverticulosis without active
inflammatory changes. There is moderate stool throughout the colon.
There is no bowel obstruction. Appendectomy.

Vascular/Lymphatic: Mild aortoiliac atherosclerotic disease. The IVC
is unremarkable. No portal venous gas. There is no adenopathy.

Reproductive: The uterus is grossly unremarkable.

Other: There is diffuse subcutaneous edema and anasarca. No fluid
collection. Small fat containing umbilical hernia.

Musculoskeletal: Degenerative changes of the spine. No acute osseous
pathology.
IMPRESSION: 1. Moderate size right pleural effusion with higher attenuating
content suspicious for a serosanguineous or hemorrhagic fluid.
Clinical correlation is recommended. There is compressive
atelectasis of the right lower lobe.
2. Small left pleural effusion with partial left lung base
atelectasis.
3. Sigmoid diverticulosis. No bowel obstruction.
4. Diffuse subcutaneous edema and anasarca.
5. Aortic Atherosclerosis ([id number]-SI4.4).

These results will be called to the ordering clinician or
representative by the Radiologist Assistant, and communication
documented in the PACS or [REDACTED].
